# Patient Record
Sex: Male | Born: 1935 | Race: Black or African American | Hispanic: No | Marital: Married | Smoking: Former smoker
Health system: Southern US, Community
[De-identification: ages and names within clinical notes are randomized; demographics above are authoritative.]

## PROBLEM LIST (undated history)

## (undated) ENCOUNTER — Telehealth

## (undated) ENCOUNTER — Ambulatory Visit: Payer: MEDICARE

## (undated) ENCOUNTER — Telehealth: Attending: Hematology & Oncology | Primary: Hematology & Oncology

## (undated) ENCOUNTER — Ambulatory Visit

## (undated) ENCOUNTER — Encounter: Attending: Pharmacist | Primary: Pharmacist

## (undated) ENCOUNTER — Encounter

## (undated) ENCOUNTER — Encounter: Attending: Hematology & Oncology | Primary: Hematology & Oncology

## (undated) ENCOUNTER — Ambulatory Visit: Payer: MEDICARE | Attending: Oncology | Primary: Oncology

## (undated) ENCOUNTER — Encounter: Attending: Pediatrics | Primary: Pediatrics

## (undated) ENCOUNTER — Ambulatory Visit: Payer: MEDICARE | Attending: Hematology & Oncology | Primary: Hematology & Oncology

## (undated) ENCOUNTER — Telehealth: Attending: Pharmacist | Primary: Pharmacist

## (undated) DIAGNOSIS — I4510 Unspecified right bundle-branch block: Secondary | ICD-10-CM

## (undated) DIAGNOSIS — I255 Ischemic cardiomyopathy: Secondary | ICD-10-CM

## (undated) DIAGNOSIS — I5042 Chronic combined systolic (congestive) and diastolic (congestive) heart failure: Secondary | ICD-10-CM

## (undated) DIAGNOSIS — D509 Iron deficiency anemia, unspecified: Secondary | ICD-10-CM

## (undated) DIAGNOSIS — K922 Gastrointestinal hemorrhage, unspecified: Secondary | ICD-10-CM

## (undated) DIAGNOSIS — E039 Hypothyroidism, unspecified: Secondary | ICD-10-CM

## (undated) DIAGNOSIS — E119 Type 2 diabetes mellitus without complications: Secondary | ICD-10-CM

## (undated) DIAGNOSIS — C9210 Chronic myeloid leukemia, BCR/ABL-positive, not having achieved remission: Secondary | ICD-10-CM

## (undated) DIAGNOSIS — N6489 Other specified disorders of breast: Secondary | ICD-10-CM

## (undated) DIAGNOSIS — K219 Gastro-esophageal reflux disease without esophagitis: Secondary | ICD-10-CM

## (undated) DIAGNOSIS — N400 Enlarged prostate without lower urinary tract symptoms: Secondary | ICD-10-CM

## (undated) DIAGNOSIS — H5440 Blindness, one eye, unspecified eye: Secondary | ICD-10-CM

## (undated) DIAGNOSIS — N184 Chronic kidney disease, stage 4 (severe): Secondary | ICD-10-CM

## (undated) DIAGNOSIS — M1990 Unspecified osteoarthritis, unspecified site: Secondary | ICD-10-CM

## (undated) DIAGNOSIS — I480 Paroxysmal atrial fibrillation: Secondary | ICD-10-CM

## (undated) DIAGNOSIS — M109 Gout, unspecified: Secondary | ICD-10-CM

## (undated) DIAGNOSIS — C61 Malignant neoplasm of prostate: Secondary | ICD-10-CM

## (undated) DIAGNOSIS — I951 Orthostatic hypotension: Secondary | ICD-10-CM

## (undated) DIAGNOSIS — I2510 Atherosclerotic heart disease of native coronary artery without angina pectoris: Secondary | ICD-10-CM

## (undated) DIAGNOSIS — I10 Essential (primary) hypertension: Secondary | ICD-10-CM

## (undated) DIAGNOSIS — G43909 Migraine, unspecified, not intractable, without status migrainosus: Secondary | ICD-10-CM

## (undated) DIAGNOSIS — G4733 Obstructive sleep apnea (adult) (pediatric): Secondary | ICD-10-CM

## (undated) DIAGNOSIS — I428 Other cardiomyopathies: Secondary | ICD-10-CM

## (undated) DIAGNOSIS — E785 Hyperlipidemia, unspecified: Secondary | ICD-10-CM

## (undated) HISTORY — DX: Gastrointestinal hemorrhage, unspecified: K92.2

## (undated) HISTORY — DX: Ischemic cardiomyopathy: I25.5

## (undated) HISTORY — PX: CATARACT EXTRACTION W/ INTRAOCULAR LENS  IMPLANT, BILATERAL: SHX1307

## (undated) HISTORY — DX: Orthostatic hypotension: I95.1

## (undated) HISTORY — PX: TOE AMPUTATION: SHX809

## (undated) HISTORY — DX: Obstructive sleep apnea (adult) (pediatric): G47.33

## (undated) HISTORY — PX: HERNIA REPAIR: SHX51

## (undated) HISTORY — DX: Chronic kidney disease, stage 4 (severe): N18.4

## (undated) HISTORY — DX: Malignant neoplasm of prostate: C61

## (undated) HISTORY — PX: EYE SURGERY: SHX253

---
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
## 1993-06-22 DIAGNOSIS — I5023 Acute on chronic systolic (congestive) heart failure: Secondary | ICD-10-CM | POA: Insufficient documentation

## 1993-06-22 DIAGNOSIS — I5022 Chronic systolic (congestive) heart failure: Secondary | ICD-10-CM

## 2001-01-05 DIAGNOSIS — G250 Essential tremor: Secondary | ICD-10-CM

## 2001-01-05 DIAGNOSIS — G252 Other specified forms of tremor: Secondary | ICD-10-CM

## 2003-09-20 DIAGNOSIS — E785 Hyperlipidemia, unspecified: Secondary | ICD-10-CM

## 2006-03-02 ENCOUNTER — Emergency Department: Payer: Self-pay | Admitting: Emergency Medicine

## 2010-06-14 ENCOUNTER — Emergency Department: Payer: Self-pay | Admitting: Emergency Medicine

## 2010-06-15 ENCOUNTER — Ambulatory Visit: Payer: Self-pay | Admitting: Surgery

## 2010-10-11 DIAGNOSIS — Z1211 Encounter for screening for malignant neoplasm of colon: Secondary | ICD-10-CM | POA: Insufficient documentation

## 2010-10-11 DIAGNOSIS — E039 Hypothyroidism, unspecified: Secondary | ICD-10-CM | POA: Insufficient documentation

## 2010-12-10 DIAGNOSIS — N400 Enlarged prostate without lower urinary tract symptoms: Secondary | ICD-10-CM | POA: Insufficient documentation

## 2011-04-01 DIAGNOSIS — D509 Iron deficiency anemia, unspecified: Secondary | ICD-10-CM | POA: Insufficient documentation

## 2011-04-11 DIAGNOSIS — Z9849 Cataract extraction status, unspecified eye: Secondary | ICD-10-CM | POA: Insufficient documentation

## 2011-06-14 DIAGNOSIS — H4310 Vitreous hemorrhage, unspecified eye: Secondary | ICD-10-CM | POA: Insufficient documentation

## 2011-09-25 DIAGNOSIS — M109 Gout, unspecified: Secondary | ICD-10-CM | POA: Insufficient documentation

## 2011-09-25 DIAGNOSIS — K219 Gastro-esophageal reflux disease without esophagitis: Secondary | ICD-10-CM | POA: Insufficient documentation

## 2011-09-25 DIAGNOSIS — E785 Hyperlipidemia, unspecified: Secondary | ICD-10-CM | POA: Insufficient documentation

## 2011-09-30 DIAGNOSIS — Z961 Presence of intraocular lens: Secondary | ICD-10-CM | POA: Insufficient documentation

## 2011-09-30 DIAGNOSIS — H179 Unspecified corneal scar and opacity: Secondary | ICD-10-CM | POA: Insufficient documentation

## 2011-11-25 DIAGNOSIS — H269 Unspecified cataract: Secondary | ICD-10-CM | POA: Insufficient documentation

## 2012-02-06 ENCOUNTER — Inpatient Hospital Stay: Payer: Self-pay | Admitting: Surgery

## 2012-02-06 LAB — PROTIME-INR
INR: 2.4
Prothrombin Time: 26.7 secs — ABNORMAL HIGH (ref 11.5–14.7)

## 2012-02-06 LAB — HEPATIC FUNCTION PANEL A (ARMC)
Albumin: 3.4 g/dL (ref 3.4–5.0)
Bilirubin, Direct: 0.1 mg/dL (ref 0.00–0.20)
SGOT(AST): 25 U/L (ref 15–37)
SGPT (ALT): 20 U/L (ref 12–78)
Total Protein: 8 g/dL (ref 6.4–8.2)

## 2012-02-06 LAB — DIFFERENTIAL
Bands: 3 %
Lymphocytes: 3 %
Metamyelocyte: 3 %
Myelocyte: 4 %

## 2012-02-06 LAB — URINALYSIS, COMPLETE
Bacteria: NONE SEEN
Bilirubin,UR: NEGATIVE
Blood: NEGATIVE
Glucose,UR: 150 mg/dL (ref 0–75)
Ketone: NEGATIVE
Leukocyte Esterase: NEGATIVE
Nitrite: NEGATIVE
Ph: 6 (ref 4.5–8.0)
RBC,UR: NONE SEEN /HPF (ref 0–5)
Squamous Epithelial: NONE SEEN

## 2012-02-06 LAB — CBC
HGB: 12 g/dL — ABNORMAL LOW (ref 13.0–18.0)
MCV: 93 fL (ref 80–100)
RBC: 3.96 10*6/uL — ABNORMAL LOW (ref 4.40–5.90)

## 2012-02-06 LAB — BASIC METABOLIC PANEL
Anion Gap: 6 — ABNORMAL LOW (ref 7–16)
BUN: 16 mg/dL (ref 7–18)
Chloride: 103 mmol/L (ref 98–107)
Co2: 26 mmol/L (ref 21–32)
Creatinine: 1.1 mg/dL (ref 0.60–1.30)
EGFR (African American): >60
Sodium: 135 mmol/L — ABNORMAL LOW (ref 136–145)

## 2012-02-06 LAB — APTT: Activated PTT: 51.8 secs — ABNORMAL HIGH (ref 23.6–35.9)

## 2012-02-07 LAB — CBC WITH DIFFERENTIAL/PLATELET
Bands: 4 %
Eosinophil: 2 %
HGB: 10.8 g/dL — ABNORMAL LOW (ref 13.0–18.0)
Lymphocytes: 11 %
MCHC: 31.8 g/dL — ABNORMAL LOW (ref 32.0–36.0)
Monocytes: 6 %
Myelocyte: 2 %
Platelet: 308 10*3/uL (ref 150–440)
RDW: 15.6 % — ABNORMAL HIGH (ref 11.5–14.5)
Segmented Neutrophils: 70 %
WBC: 40.6 10*3/uL — ABNORMAL HIGH (ref 3.8–10.6)

## 2012-02-07 LAB — PROTIME-INR
INR: 2.6
Prothrombin Time: 28.1 secs — ABNORMAL HIGH (ref 11.5–14.7)

## 2012-02-07 LAB — WBCS, STOOL

## 2012-02-08 LAB — COMPREHENSIVE METABOLIC PANEL
Albumin: 3.1 g/dL — ABNORMAL LOW (ref 3.4–5.0)
Alkaline Phosphatase: 82 U/L (ref 50–136)
Anion Gap: 8 (ref 7–16)
BUN: 9 mg/dL (ref 7–18)
Bilirubin,Total: 0.8 mg/dL (ref 0.2–1.0)
Calcium, Total: 9 mg/dL (ref 8.5–10.1)
Chloride: 104 mmol/L (ref 98–107)
Co2: 25 mmol/L (ref 21–32)
Creatinine: 1 mg/dL (ref 0.60–1.30)
EGFR (African American): >60
EGFR (Non-African Amer.): >60
Osmolality: 275 (ref 275–301)
SGPT (ALT): 20 U/L (ref 12–78)
Sodium: 137 mmol/L (ref 136–145)
Total Protein: 7.2 g/dL (ref 6.4–8.2)

## 2012-02-08 LAB — CBC WITH DIFFERENTIAL/PLATELET
Bands: 3 %
HGB: 11.5 g/dL — ABNORMAL LOW (ref 13.0–18.0)
Lymphocytes: 12 %
MCH: 29.2 pg (ref 26.0–34.0)
MCV: 94 fL (ref 80–100)
Metamyelocyte: 1 %
Monocytes: 4 %
Platelet: 351 10*3/uL (ref 150–440)
RBC: 3.92 10*6/uL — ABNORMAL LOW (ref 4.40–5.90)
Segmented Neutrophils: 74 %
Variant Lymphocyte - H1-Rlymph: 5 %
WBC: 34.5 10*3/uL — ABNORMAL HIGH (ref 3.8–10.6)

## 2012-02-08 LAB — APTT: Activated PTT: 44.5 secs — ABNORMAL HIGH (ref 23.6–35.9)

## 2012-02-08 LAB — PROTIME-INR: INR: 2.1

## 2012-02-09 LAB — CBC WITH DIFFERENTIAL/PLATELET
Bands: 2 %
Basophil: 1 %
HGB: 11.1 g/dL — ABNORMAL LOW (ref 13.0–18.0)
Lymphocytes: 11 %
MCHC: 32.3 g/dL (ref 32.0–36.0)
Metamyelocyte: 5 %
Monocytes: 8 %
Myelocyte: 2 %
RDW: 15 % — ABNORMAL HIGH (ref 11.5–14.5)
Segmented Neutrophils: 63 %
WBC: 29.6 10*3/uL — ABNORMAL HIGH (ref 3.8–10.6)

## 2012-02-09 LAB — VANCOMYCIN, TROUGH: Vancomycin, Trough: 9 ug/mL — ABNORMAL LOW (ref 10–20)

## 2012-02-09 LAB — PROTIME-INR
INR: 1.9
Prothrombin Time: 22 secs — ABNORMAL HIGH (ref 11.5–14.7)

## 2012-02-10 LAB — CBC WITH DIFFERENTIAL/PLATELET
Bands: 2 %
Basophil: 3 %
HGB: 12.2 g/dL — ABNORMAL LOW (ref 13.0–18.0)
Lymphocytes: 8 %
MCV: 93 fL (ref 80–100)
Myelocyte: 2 %
RBC: 3.86 10*6/uL — ABNORMAL LOW (ref 4.40–5.90)
RDW: 15 % — ABNORMAL HIGH (ref 11.5–14.5)
Segmented Neutrophils: 76 %
WBC: 37 10*3/uL — ABNORMAL HIGH (ref 3.8–10.6)

## 2012-02-10 LAB — BASIC METABOLIC PANEL
Calcium, Total: 9.1 mg/dL (ref 8.5–10.1)
Creatinine: 1.03 mg/dL (ref 0.60–1.30)
EGFR (African American): >60
EGFR (Non-African Amer.): >60
Glucose: 153 mg/dL — ABNORMAL HIGH (ref 65–99)
Sodium: 135 mmol/L — ABNORMAL LOW (ref 136–145)

## 2012-02-10 LAB — STOOL CULTURE

## 2012-02-11 LAB — BASIC METABOLIC PANEL
BUN: 14 mg/dL (ref 7–18)
Calcium, Total: 9.2 mg/dL (ref 8.5–10.1)
Chloride: 104 mmol/L (ref 98–107)
Co2: 24 mmol/L (ref 21–32)
Creatinine: 1.13 mg/dL (ref 0.60–1.30)
EGFR (African American): >60
EGFR (Non-African Amer.): >60
Glucose: 141 mg/dL — ABNORMAL HIGH (ref 65–99)
Potassium: 4 mmol/L (ref 3.5–5.1)
Sodium: 135 mmol/L — ABNORMAL LOW (ref 136–145)

## 2012-02-11 LAB — CBC WITH DIFFERENTIAL/PLATELET
Eosinophil: 2 %
Lymphocytes: 5 %
MCH: 30.4 pg (ref 26.0–34.0)
MCV: 93 fL (ref 80–100)
Metamyelocyte: 6 %
Monocytes: 7 %
Myelocyte: 1 %
Platelet: 415 10*3/uL (ref 150–440)
RBC: 4 10*6/uL — ABNORMAL LOW (ref 4.40–5.90)
Segmented Neutrophils: 71 %
WBC: 38.6 10*3/uL — ABNORMAL HIGH (ref 3.8–10.6)

## 2012-02-11 LAB — VANCOMYCIN, TROUGH: Vancomycin, Trough: 16 ug/mL (ref 10–20)

## 2012-02-11 LAB — STOOL CULTURE

## 2012-02-11 LAB — PROTIME-INR: INR: 1.2

## 2012-02-12 LAB — URINALYSIS, COMPLETE
Bacteria: NONE SEEN
Blood: NEGATIVE
Glucose,UR: 50 mg/dL (ref 0–75)
Ketone: NEGATIVE
Leukocyte Esterase: NEGATIVE
Nitrite: NEGATIVE
Ph: 7 (ref 4.5–8.0)
Protein: NEGATIVE
RBC,UR: <1 /HPF (ref 0–5)
Specific Gravity: 1.004 (ref 1.003–1.030)
Squamous Epithelial: NONE SEEN
WBC UR: NONE SEEN /HPF (ref 0–5)

## 2012-02-12 LAB — CBC WITH DIFFERENTIAL/PLATELET
Bands: 6 %
Basophil: 1 %
Eosinophil: 1 %
HGB: 11.3 g/dL — ABNORMAL LOW (ref 13.0–18.0)
MCH: 30.3 pg (ref 26.0–34.0)
MCHC: 32.8 g/dL (ref 32.0–36.0)
MCV: 93 fL (ref 80–100)
Metamyelocyte: 3 %
Monocytes: 6 %
Myelocyte: 4 %
Platelet: 381 10*3/uL (ref 150–440)
Segmented Neutrophils: 70 %
Variant Lymphocyte - H1-Rlymph: 1 %

## 2012-02-12 LAB — BASIC METABOLIC PANEL
Anion Gap: 7 (ref 7–16)
BUN: 14 mg/dL (ref 7–18)
Calcium, Total: 8.9 mg/dL (ref 8.5–10.1)
Creatinine: 1.1 mg/dL (ref 0.60–1.30)
EGFR (Non-African Amer.): >60
Glucose: 170 mg/dL — ABNORMAL HIGH (ref 65–99)
Osmolality: 276 (ref 275–301)
Potassium: 3.7 mmol/L (ref 3.5–5.1)

## 2012-02-12 LAB — PROTIME-INR: INR: 1.2

## 2012-02-13 LAB — CBC WITH DIFFERENTIAL/PLATELET
Basophil #: 0.7 10*3/uL — ABNORMAL HIGH (ref 0.0–0.1)
Basophil %: 1.9 %
Basophil: 1 %
Eosinophil #: 0.7 10*3/uL (ref 0.0–0.7)
Eosinophil: 1 %
HCT: 32.5 % — ABNORMAL LOW (ref 40.0–52.0)
HGB: 10.5 g/dL — ABNORMAL LOW (ref 13.0–18.0)
Lymphocyte #: 2.2 10*3/uL (ref 1.0–3.6)
Lymphocyte %: 6.4 %
Lymphocytes: 8 %
MCHC: 32.4 g/dL (ref 32.0–36.0)
MCV: 93 fL (ref 80–100)
Metamyelocyte: 7 %
Monocyte #: 2.4 x10 3/mm — ABNORMAL HIGH (ref 0.2–1.0)
Myelocyte: 5 %
Neutrophil #: 28.5 10*3/uL — ABNORMAL HIGH (ref 1.4–6.5)
Neutrophil %: 82.8 %
RBC: 3.5 10*6/uL — ABNORMAL LOW (ref 4.40–5.90)
Segmented Neutrophils: 71 %
WBC: 34.4 10*3/uL — ABNORMAL HIGH (ref 3.8–10.6)

## 2012-02-15 LAB — WOUND CULTURE

## 2012-02-19 ENCOUNTER — Other Ambulatory Visit: Payer: Self-pay | Admitting: Surgery

## 2012-02-19 LAB — CBC WITH DIFFERENTIAL/PLATELET
Basophil #: 0.2 10*3/uL — ABNORMAL HIGH (ref 0.0–0.1)
Basophil %: 1.1 %
Eosinophil %: 1.2 %
HCT: 36.9 % — ABNORMAL LOW (ref 40.0–52.0)
HGB: 11.7 g/dL — ABNORMAL LOW (ref 13.0–18.0)
Lymphocyte %: 13.9 %
MCH: 29.5 pg (ref 26.0–34.0)
MCV: 93 fL (ref 80–100)
Neutrophil #: 15.5 10*3/uL — ABNORMAL HIGH (ref 1.4–6.5)
Neutrophil %: 74.9 %
RBC: 3.98 10*6/uL — ABNORMAL LOW (ref 4.40–5.90)
RDW: 15.6 % — ABNORMAL HIGH (ref 11.5–14.5)

## 2012-02-27 ENCOUNTER — Other Ambulatory Visit: Payer: Self-pay | Admitting: Surgery

## 2012-02-27 LAB — CBC WITH DIFFERENTIAL/PLATELET
Basophil: 2 %
Eosinophil: 1 %
HCT: 35.9 % — ABNORMAL LOW (ref 40.0–52.0)
MCH: 29.6 pg (ref 26.0–34.0)
MCHC: 31.7 g/dL — ABNORMAL LOW (ref 32.0–36.0)
MCV: 93 fL (ref 80–100)
Myelocyte: 1 %
Platelet: 352 10*3/uL (ref 150–440)
RBC: 3.85 10*6/uL — ABNORMAL LOW (ref 4.40–5.90)
RDW: 15.4 % — ABNORMAL HIGH (ref 11.5–14.5)

## 2012-03-13 ENCOUNTER — Ambulatory Visit: Payer: Self-pay | Admitting: Oncology

## 2012-03-14 ENCOUNTER — Emergency Department: Payer: Self-pay | Admitting: Unknown Physician Specialty

## 2012-03-14 LAB — PROTIME-INR: INR: 2.2

## 2012-03-14 LAB — HEMATOCRIT: HCT: 36.9 % — ABNORMAL LOW (ref 40.0–52.0)

## 2012-03-14 LAB — HEMOGLOBIN: HGB: 12 g/dL — ABNORMAL LOW (ref 13.0–18.0)

## 2012-03-16 LAB — CBC CANCER CENTER
Basophil #: 0 x10 3/mm (ref 0.0–0.1)
Basophil %: 0.2 %
Eosinophil #: 0.1 x10 3/mm (ref 0.0–0.7)
HGB: 12.5 g/dL — ABNORMAL LOW (ref 13.0–18.0)
Lymphocyte %: 16.1 %
MCHC: 33.1 g/dL (ref 32.0–36.0)
Monocyte %: 10.4 %
Neutrophil %: 72.4 %
Platelet: 345 x10 3/mm (ref 150–440)
RBC: 4.1 10*6/uL — ABNORMAL LOW (ref 4.40–5.90)
WBC: 15.1 x10 3/mm — ABNORMAL HIGH (ref 3.8–10.6)

## 2012-03-18 ENCOUNTER — Ambulatory Visit: Payer: Self-pay | Admitting: Oncology

## 2012-05-03 ENCOUNTER — Emergency Department: Payer: Self-pay | Admitting: Internal Medicine

## 2012-05-03 LAB — COMPREHENSIVE METABOLIC PANEL
Alkaline Phosphatase: 99 U/L (ref 50–136)
Anion Gap: 10 (ref 7–16)
BUN: 23 mg/dL — ABNORMAL HIGH (ref 7–18)
Bilirubin,Total: 0.6 mg/dL (ref 0.2–1.0)
Calcium, Total: 8.4 mg/dL — ABNORMAL LOW (ref 8.5–10.1)
Chloride: 103 mmol/L (ref 98–107)
Creatinine: 1.11 mg/dL (ref 0.60–1.30)
EGFR (Non-African Amer.): >60
Glucose: 313 mg/dL — ABNORMAL HIGH (ref 65–99)
Potassium: 4.5 mmol/L (ref 3.5–5.1)
Sodium: 135 mmol/L — ABNORMAL LOW (ref 136–145)

## 2012-05-03 LAB — CBC
HCT: 41.2 % (ref 40.0–52.0)
HGB: 13.7 g/dL (ref 13.0–18.0)
MCH: 30.6 pg (ref 26.0–34.0)
MCHC: 33.3 g/dL (ref 32.0–36.0)
MCV: 92 fL (ref 80–100)
Platelet: 340 10*3/uL (ref 150–440)
RBC: 4.48 10*6/uL (ref 4.40–5.90)
RDW: 15.6 % — ABNORMAL HIGH (ref 11.5–14.5)
WBC: 22.4 10*3/uL — ABNORMAL HIGH (ref 3.8–10.6)

## 2012-05-03 LAB — CK TOTAL AND CKMB (NOT AT ARMC)
CK, Total: 101 U/L (ref 35–232)
CK-MB: <0.5 ng/mL — ABNORMAL LOW (ref 0.5–3.6)

## 2012-05-03 LAB — PROTIME-INR: Prothrombin Time: 24.2 secs — ABNORMAL HIGH (ref 11.5–14.7)

## 2012-05-03 LAB — TROPONIN I: Troponin-I: <0.02 ng/mL

## 2012-05-09 LAB — CULTURE, BLOOD (SINGLE)

## 2012-05-17 ENCOUNTER — Observation Stay: Payer: Self-pay | Admitting: Internal Medicine

## 2012-05-17 LAB — URINALYSIS, COMPLETE
Bacteria: NONE SEEN
Hyaline Cast: 5
Nitrite: NEGATIVE
Ph: 5 (ref 4.5–8.0)
RBC,UR: <1 /HPF (ref 0–5)
Specific Gravity: 1.01 (ref 1.003–1.030)
Squamous Epithelial: 1
WBC UR: <1 /HPF (ref 0–5)

## 2012-05-17 LAB — COMPREHENSIVE METABOLIC PANEL
Albumin: 4 g/dL (ref 3.4–5.0)
Anion Gap: 7 (ref 7–16)
Calcium, Total: 9.3 mg/dL (ref 8.5–10.1)
Chloride: 91 mmol/L — ABNORMAL LOW (ref 98–107)
EGFR (African American): 41 — ABNORMAL LOW
EGFR (Non-African Amer.): 35 — ABNORMAL LOW
Glucose: 459 mg/dL — ABNORMAL HIGH (ref 65–99)
Osmolality: 279 (ref 275–301)
Sodium: 123 mmol/L — ABNORMAL LOW (ref 136–145)
Total Protein: 8.5 g/dL — ABNORMAL HIGH (ref 6.4–8.2)

## 2012-05-17 LAB — PROTIME-INR
INR: 2.5
Prothrombin Time: 26.2 secs — ABNORMAL HIGH (ref 11.5–14.7)

## 2012-05-17 LAB — DIGOXIN LEVEL: Digoxin: 1.1 ng/mL

## 2012-05-17 LAB — CBC
HCT: 41.7 % (ref 40.0–52.0)
HGB: 13.5 g/dL (ref 13.0–18.0)
MCH: 29.9 pg (ref 26.0–34.0)
MCHC: 32.4 g/dL (ref 32.0–36.0)
MCV: 92 fL (ref 80–100)
Platelet: 425 10*3/uL (ref 150–440)
RBC: 4.52 10*6/uL (ref 4.40–5.90)
RDW: 14.9 % — ABNORMAL HIGH (ref 11.5–14.5)
WBC: 27.5 10*3/uL — ABNORMAL HIGH (ref 3.8–10.6)

## 2012-05-17 LAB — DIFFERENTIAL
Monocytes: 6 %
Segmented Neutrophils: 84 %

## 2012-05-17 LAB — PRO B NATRIURETIC PEPTIDE: B-Type Natriuretic Peptide: 55 pg/mL (ref 0–450)

## 2012-05-18 LAB — BASIC METABOLIC PANEL WITH GFR
Anion Gap: 6 — ABNORMAL LOW
BUN: 38 mg/dL — ABNORMAL HIGH
Calcium, Total: 8.4 mg/dL — ABNORMAL LOW
Chloride: 103 mmol/L
Co2: 25 mmol/L
Creatinine: 1.26 mg/dL
EGFR (African American): >60
EGFR (Non-African Amer.): 55 — ABNORMAL LOW
Glucose: 220 mg/dL — ABNORMAL HIGH
Osmolality: 284
Potassium: 4.2 mmol/L
Sodium: 134 mmol/L — ABNORMAL LOW

## 2012-05-18 LAB — CBC WITH DIFFERENTIAL/PLATELET
Basophil: 4 %
HCT: 36.1 % — ABNORMAL LOW
HGB: 11.7 g/dL — ABNORMAL LOW
Lymphocytes: 10 %
MCH: 29.7 pg
MCHC: 32.4 g/dL
MCV: 92 fL
Metamyelocyte: 1 %
Monocytes: 6 %
Myelocyte: 2 %
Platelet: 358 10*3/uL
RBC: 3.94 x10 6/mm 3 — ABNORMAL LOW
RDW: 15.3 % — ABNORMAL HIGH
Segmented Neutrophils: 76 %
Variant Lymphocyte - H1-Rlymph: 1 %
WBC: 23.5 10*3/uL — ABNORMAL HIGH

## 2012-09-15 HISTORY — PX: CORONARY ANGIOPLASTY WITH STENT PLACEMENT: SHX49

## 2013-04-09 DIAGNOSIS — E1121 Type 2 diabetes mellitus with diabetic nephropathy: Secondary | ICD-10-CM | POA: Insufficient documentation

## 2013-04-09 DIAGNOSIS — Z794 Long term (current) use of insulin: Secondary | ICD-10-CM | POA: Insufficient documentation

## 2013-04-09 DIAGNOSIS — E1140 Type 2 diabetes mellitus with diabetic neuropathy, unspecified: Secondary | ICD-10-CM | POA: Insufficient documentation

## 2013-04-09 DIAGNOSIS — E1165 Type 2 diabetes mellitus with hyperglycemia: Secondary | ICD-10-CM

## 2013-04-10 DIAGNOSIS — M7750 Other enthesopathy of unspecified foot: Secondary | ICD-10-CM | POA: Insufficient documentation

## 2013-10-22 DIAGNOSIS — H2512 Age-related nuclear cataract, left eye: Secondary | ICD-10-CM | POA: Insufficient documentation

## 2014-03-31 DIAGNOSIS — N62 Hypertrophy of breast: Secondary | ICD-10-CM | POA: Insufficient documentation

## 2014-05-16 ENCOUNTER — Emergency Department: Payer: Self-pay | Admitting: Emergency Medicine

## 2014-05-20 ENCOUNTER — Emergency Department: Payer: Self-pay | Admitting: Emergency Medicine

## 2014-07-05 NOTE — Consult Note (Signed)
PATIENT NAME:  Lucas Caldwell, Lucas Caldwell MR#:  [id number] DATE OF BIRTH:  Mar 25, 1933  DATE OF CONSULTATION:  02/07/2012  REFERRING PHYSICIAN:  Dr. Rexene Edison of surgery  CONSULTING PHYSICIAN:  Devi Hopman R. Cartrell Bentsen, MD  REASON FOR CONSULTATION: Atrial fibrillation on Coumadin.   HISTORY OF PRESENTING ILLNESS: 81 year old African American male patient with history of atrial fibrillation on Coumadin, hypertension, type 2 diabetes mellitus presented to the Emergency Room complaining of two to three days of pain and swelling in his scrotal area. Patient initially saw his primary care physician, was given clindamycin along with advised on warm bath and sent home. This did not respond well. He called his primary care physician who advised him to present in the ER. Patient has been admitted to the surgical service, is on vancomycin for the same, seems to be improving and possible surgery is being considered. Patient's INR is 2.4. His Coumadin has been held at this time. [name] on Lovenox for deep vein thrombosis prophylaxis.   He does not complain of any shortness of breath, abdominal pain or rash. His scrotal pain and swelling is slowly improving.   PAST MEDICAL HISTORY:  1. Atrial fibrillation on Coumadin. 2. Hypertension. 3. Gout. 4. Type 2 diabetes mellitus. 5. Dyslipidemia. 6. Gastroesophageal reflux disease. 7. Status post umbilicus hernia repair.   MEDICATIONS:  1. Allopurinol 300 mg daily.  2. Aspirin 81 mg daily.  3. Brilinta 90 mg b.i.d.  4. Clindamycin 300 mg 3 times a day.  5. Digoxin 0.125 mg daily.  6. Enalapril 5 mg daily.  7. Ferrous gluconate 324 mg daily.  8. Lasix 40 mg q.a.m. and 20 mg q.p.m.  9. Glipizide 10 mg daily.  10. Levoxyl 0.05 mg daily.  11. Metanx 1 capsule daily.  12. Metoprolol 50 mg daily.  13. Omeprazole 20 mg daily.  14. Simvastatin 20 mg at bedtime.  15. Coumadin 5 mg at bedtime.   ALLERGIES: Plavix which causes confusion.   REVIEW OF SYSTEMS: Please see history  of presenting illness. Rest of the systems reviewed and negative, 12 systems reviewed.   FAMILY HISTORY: Reviewed and unknown.   SOCIAL HISTORY: Patient is married, lives with his wife. 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   PHYSICAL EXAMINATION:  VITAL SIGNS: Temperature 98.3, pulse 85, blood pressure 133/74, saturating 94% on room.   GENERAL: Obese African American male patient lying in bed, comfortable, conversational, cooperative with exam.   PSYCHIATRIC: Alert, oriented x3. Mood and affect appropriate.  HEENT: Atraumatic, normocephalic. Oral mucosa moist and pink. External ears and nose normal. No pallor. No icterus. Pupils bilaterally equal and reactive to light.   NECK: Supple. No thyromegaly. No palpable lymph nodes. Trachea midline. No carotid bruit, JVD.   CARDIOVASCULAR: S1, S2, regular rate and rhythm without any murmurs.   RESPIRATORY: Normal work of breathing. Clear to auscultation both sides.   GASTROINTESTINAL: Soft abdomen, nontender. Bowel sounds present. No hepatosplenomegaly palpable.   SKIN: Warm and dry. No petechiae, rash, ulcers. Has mild erythema in his scrotal area.   GENITAL: Genital examination shows no swelling. Has some redness.   NEUROLOGICAL: Motor strength 5/5 in upper and lower extremities. Sensation to fine touch intact all over.   LYMPHATIC: No cervical or inguinal lymphadenopathy.   LABORATORY, DIAGNOSTIC AND RADIOLOGIC DATA: Glucose 273, BUN 16, creatinine 1.10, sodium 135, potassium 4.4, WBC 40.6, hemoglobin 10.8, platelets 308. INR 2.6. Lactic acid was 1.8 at the time of admission.   ASSESSMENT AND PLAN:  1. Perineal  erythema, swelling. Possible cellulitis. Patient is being watched and being treated with vancomycin at this time. May need surgery. Patient would be a low risk for this surgery. [name] on Coumadin with elevated INR which increases his change of bleeding. Coumadin has been held. His INR needs to be reversed back to normal  or wait for it to come back to normal prior to surgery at this time. Await cultures.  2. Atrial fibrillation, on Coumadin. INR is therapeutic at this time. Continue the rate control medications. No acute needs.  3. Diabetes mellitus, uncontrolled likely secondary from the infection. Sliding scale insulin and diabetic diet.  4. Hypertension, well controlled. Continue medications.  5. Deep vein prophylaxis. Patient is on Coumadin, elevated INR.  6. CODE STATUS: FULL CODE.   TIME SPENT: Time spent on this case was 45 minutes.   ____________________________ Leia Alf Courtland Reas, MD srs:cms D: 02/07/2012 13:59:30 ET T: 02/07/2012 14:12:27 ET JOB#: [id number]  cc: Alveta Heimlich R. Renika Shiflet, MD, <Dictator> Algis Liming. Jimmye Norman, MD Neita Carp MD ELECTRONICALLY SIGNED 02/08/2012 16:17

## 2014-07-05 NOTE — Discharge Summary (Signed)
PATIENT NAME:  Lucas Caldwell, Lucas Caldwell MR#:  [id number] DATE OF BIRTH:  18-Mar-1934  DATE OF ADMISSION:  02/06/2012 DATE OF DISCHARGE:  02/13/2012  FINAL DIAGNOSES:  1. Perineal abscess status post drainage.  2. Leukocytosis, infectious versus neoplastic etiology.  3. Obesity.  4. Type II diabetes.  5. History of atrial fibrillation.  6. Hypertension.  7. Gout.  8. Gastroesophageal reflux disease.  9. Dyslipidemia.   HOSPITAL COURSE SUMMARY: The patient was admitted with a large perineal abscess. No obvious area of fluctuation. White count was markedly elevated. Medical consultation was obtained. His Coumadin and Brilinta was held. His INR slowly drifted down. White count remained elevated. Ultrasound of the perineum was obtained on the 24th which demonstrated an inflammatory phlegmon. He did continue to have significant pain in his scrotal and perianal region. For this reason, the patient was brought to the operating room on November 26th at which point a perianal perineal body abscess was drained. Despite this, his white count did not improve. He was treated during the hospitalization with vancomycin and intravenous vancomycin. Furthermore, a CT scan obtained on the day of his admission demonstrated stranding seen within the subcutaneous fat of the perineum. No discrete fluid collections were noticed. Following the incision and drainage, the patient had immediate pain relief. He was seen on November 27th, postop day #1, and was seen to be doing well. White count remained markedly elevated. On postoperative day #2, the patient was having no complaints and was examined at the bedside. Packing was removed. No evidence of undrained purulence was noted. Thinking at this point was that possibly this might represent chronic myelogenous leukemia. There was an increase in neutrophils on the differential. For this reason the patient was, therefore, discharged home with oral antibiotics, pain medication, and  follow-up with me in the office next week with repeat white count at that time. Will refer to Oncology and Hematology if needed based on those findings.    DISCHARGE MEDICATIONS:  1. Norco 1 to 2 tabs every 4 to 6 hours as needed for pain. 2. Bactrim Ds 1 tab po bid for 7 days. 3. Metoprolol 50 mg extended-release once a day.  4. Ferrous sulfate.  5. Brilinta 90 mg by mouth b.i.d.  6. Allopurinol 30 mg by mouth once a day. 7. Aspirin 81 mg by mouth once a day. 8. Digoxin 0.125 mg by mouth once a day. 9. Simvastatin 20 mg by mouth once a day. 10. Synthroid 50 mcg by mouth once a day. 11. Omeprazole 20 mg extended-release once a day. 12. Lasix 40 mg by mouth once a day. 13. Lasix 20 mg by mouth once in the afternoon after lunch. 14. Enalapril 5 mg by mouth once a day. 15. Glipizide 10 mg by mouth once a day. 16. Warfarin 5 mg by mouth once a day.  17. Metanx oral capsule one cap by mouth once a day.   Call with any questions or concerns. Follow-up is on 02/19/2012 in the [place] office.   ____________________________ Jeannette How. Marina Gravel, MD mab:drc D: 02/13/2012 09:51:10 ET T: 02/13/2012 15:36:36 ET JOB#: [id number]  cc: Elta Guadeloupe A. Marina Gravel, MD, <Dictator> Hortencia Conradi MD ELECTRONICALLY SIGNED 02/15/2012 17:34

## 2014-07-05 NOTE — Op Note (Signed)
PATIENT NAME:  Lucas Caldwell, Lucas Caldwell MR#:  [id number] DATE OF BIRTH:  May 21, 1933  DATE OF PROCEDURE:  02/11/2012  PREOPERATIVE DIAGNOSIS: Perianal abscess.   POSTOPERATIVE DIAGNOSIS: Perianal abscess.   PROCEDURE PERFORMED: Examination under anesthesia, incision and drainage of left-sided perianal abscess.   SURGEON: Diannah Rindfleisch A. Marina Gravel, M.D.   SPECIMENS: Pus.   FINDINGS: As described above.   DESCRIPTION OF PROCEDURE: With the patient in the supine position, general anesthesia was induced. He was then positioned and padded in dorsal lithotomy. The perineum was sterilely prepped and draped with Betadine solution. Time out was observed. Digital rectal examination demonstrated the abscess to be much higher up on the perineal body, on the left side. There did not appear to be a direct communication to the anus. The bivalved anoscope was introduced. I could not identify a fistulous connection.   A cruciate incision was fashioned at the most fluctuant area of the mass, on the perineum, to the left of the median raphe. A large amount of pus was extruded. There was a good-sized cavity measuring at least 4 to 5 cm in total anterior to posterior direction. This was then irrigated multiple times with saline solution. With hemostasis appearing adequate on the operative field.  The wound was then packed with a long piece of half-inch iodoform gauze. 4 x 4's and mesh surgical underwear were then applied. The patient was then returned to the recovery room in stable and satisfactory condition. ____________________________ Jeannette How Marina Gravel, MD mab:slb D: 02/12/2012 09:48:00 ET T: 02/12/2012 10:01:52 ET JOB#: [id number]  cc: Elta Guadeloupe A. Marina Gravel, MD, <Dictator> Hortencia Conradi MD ELECTRONICALLY SIGNED 02/15/2012 17:31

## 2014-07-05 NOTE — H&P (Signed)
PATIENT NAME:  Lucas Caldwell, Lucas Caldwell MR#:  [id number] DATE OF BIRTH:  Jul 25, 1933  DATE OF ADMISSION:  02/06/2012  HISTORY OF PRESENT ILLNESS: Mr. Conto is a 81 year old black male who has a 2-1/2 day history of pain in his scrotal raphe region. He sought medical attention 2-1/2 days ago and was told to take warm baths and he was placed on clindamycin 300 mg t.i.d. He had difficulty with the first bath and, therefore, he has been using hot water bottles on his scrotal raphe for the last 2-1/2 days. He says that his pain is somewhat improved since Tuesday. He denies fever, chills, nausea, vomiting, diarrhea, hematochezia. He does have fairly severe constipation and he says his last good bowel movement was last week. He has passed very small amounts of hard stool over the last 5 or 6 days only. 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 He did not have any drainage and says that it is somewhat difficult (because of pain) to have a bowel movement.   PAST MEDICAL HISTORY:  1. Atrial fibrillation.  2. Hypertension.  3. Gout.  4. Type II diabetes mellitus.  5. Dyslipidemia.  6. Gastroesophageal reflux disease.  7. Status post umbilical hernia repair.  8. Severe right cataract with unilateral blindness.   MEDICATIONS:  1. Allopurinol 300 mg daily.  2. Aspirin 81 mg daily.  3. Brilinta 90 mg b.i.d.  4. Clindamycin 300 mg t.i.d.  5. Digoxin 0.125 mg daily.  6. Enalapril 5 mg daily.  7. Ferrous gluconate 324 mg daily.  8. Lasix 40 mg q.a.m. and 20 mg q.p.m.  9. Glipizide 10 mg daily.  10. Levoxyl 0.05 mg daily.  11. Metanx 1 capsule daily.  12. Metoprolol 50 mg daily.  13. Omeprazole 20 mg daily.  14. Simvastatin 20 mg at bedtime.  15. Warfarin 5 mg q.p.m.   ALLERGIES: Plavix (altered mental status).    REVIEW OF SYSTEMS: Negative for 10 systems except the constipation and groin pain as mentioned in the history of present  illness. Specifically, the patient denies fever, chills, and drainage.   FAMILY HISTORY: Noncontributory.   SOCIAL HISTORY: The patient is married and lives with his wife and his 79 year old son lives with them. 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 He quit smoking cigarettes 25 years ago and does not drink alcohol.   PHYSICAL EXAMINATION:   GENERAL: Pleasant, healthy elderly black male lying comfortably on the Emergency Department stretcher.   VITAL SIGNS: Height 5 feet 6 inches, weight 189 pounds, BMI 30.5. Temperature 98.3, pulse 85, respirations 20, blood pressure 146/77, oxygen saturation 97% on room air at rest.   HEENT: The right eye has a severe cataract and the left pupil is round and reactive to light. Extraocular movements intact. Sclerae nonicteric. Oropharynx clear. Mucous membranes moist. Hearing intact to voice.   NECK: Supple with no tracheal deviation or jugular venous distention.   HEART: Regular rate and rhythm with no murmurs or rubs.   LUNGS: Clear to auscultation with normal respiratory effort bilaterally.   ABDOMEN: Soft, nontender, nondistended with no palpable hepatosplenomegaly or other masses.   EXTREMITIES: No edema with normal capillary refill bilaterally.   RECTAL: The anus appears normal and digital rectal examination fails to reveal any induration consistent with perianal fistula or abscess and the prostate gland is not boggy  and not nodular. There is about a thumb thickness size swelling in the scrotal raphe going from the hemiscrotum down to the anus. It is about 3 or 4 cm long. There is a fairly sharp shelf at the scrotum such that the scrotum itself is not involved. As mentioned earlier, the anal canal is also not involved.   NEUROLOGIC: Cranial nerves II through XII, motor and sensation grossly intact.   PSYCHIATRIC: Alert and oriented x4. Appropriate affect.   LABORATORY, DIAGNOSTIC, AND RADIOLOGICAL  DATA: Glucose 238, BUN and creatinine 16 and 1.1. Electrolytes normal. Hepatic profile normal. White blood cell count 48.5, hemoglobin 12, hematocrit 36.8%, platelet count 326,000, normal differential. PT 27. INR 2.4. PTT 52. Urinalysis normal.  EKG not performed.   Lactic acid 1.8.   CT scan of the abdomen and pelvis with contrast is essentially normal with the exception of constipation, a 2 cm right external iliac lymph node, and nonspecific stranding in the subcutaneous fat of the perineum. There is no discrete fluid collection or gas to suggest abscess.   ASSESSMENT: Fournier's gangrene equivalent, early and clinically slowly resolving in a patient with no obvious perineal source such as a perianal fistula or abscess. The patient is a diabetic and has an extremely high white blood cell count.   PLAN:  1. Admit to the hospital for IV fluid and IV antibiotics and a bowel regimen to control his constipation.  2. I will hold his antiplatelet agents and his Coumadin in case he needs surgery (such as a diverting colostomy) but at this point I do not think surgery will be needed.   ____________________________ Consuela Mimes, MD wfm:drc D: 02/06/2012 23:01:16 ET T: 02/07/2012 06:08:48 ET JOB#: [id number] cc: Consuela Mimes, MD, <Dictator> Consuela Mimes MD ELECTRONICALLY SIGNED 02/09/2012 7:59

## 2014-07-08 NOTE — Discharge Summary (Signed)
PATIENT NAME:  Lucas Caldwell, Lucas Caldwell MR#:  [id number] DATE OF BIRTH:  1933-08-30  DATE OF ADMISSION:  05/17/2012 DATE OF DISCHARGE:  05/18/2012  ADMITTING DIAGNOSES: Dizziness, hyperglycemia.  DISCHARGE DIAGNOSES: 1.  Dizziness likely related to dehydration as a result of his hyperglycemia.  2.  Hyperglycemia as a result of recently being started on low dose insulin. The patient needs more insulin. His Lantus has been increased.  3.  Diabetes type 2 with poor control.  4.  History of atrial fibrillation, on chronic Coumadin.  5.  Hypertension.  6.  History of gout.  7.  Dyslipidemia.  8.  Gastroesophageal reflux disease.  9.  Hypothyroidism.  10.  Chronic leukocytosis, currently being followed by hematology.  CONSULTANTS: None.   PERTINENT LABS AND EVALUATIONS: Admitting BMP showed a glucose of 455, BUN 44, creatinine 1.83, sodium 123, potassium 4.9 and AST, ALT and alk phos were normal. WBC count 27.5 and hemoglobin 13.5. Urinalysis was negative. Most recent BMP: Glucose this morning was 220, BUN 38, creatinine 1.26, sodium 134 and potassium 4.2. His INR was 2.5.   Chest x-ray showed no acute cardiopulmonary processes.   HOSPITAL COURSE: Please refer to the H and P done by the admitting physician. The patient is a 81 year old African American male who has a history of diabetes and his sugar control was poor with blood sugars in the 200s so he was recently seen by his primary care provider approximately 7 days ago and was started on Lantus. The patient was also on glipizide, which apparently he stopped taking because he was not sure if he was to continue that or not. He started noticing that after starting his Lantus his blood sugar started staying high in the 400s. He also was urinating more frequently. He was feeling thirsty and he was feeling dizzy. Due to these symptoms he came to the ED. We were asked to admit the patient for severe hyperglycemia. He was also noted to have elevated  creatinine and hyponatremia. The patient was admitted, given IV fluids, and his Lantus dose was increased. With these interventions his blood sugars improved, in the 200 range. I explained to the patient that it may take a little adjustment before sugars are much better with Lantus. [name] currently not on any short-acting premeal insulin. I am not sure if the patient will be able to comply with 3 short-acting insulin shots a day along with the Lantus, so at this time I recommend that he increase his Lantus dose, at home keep a log and further adjustment will need to be done as an outpatient. [name] doing much better and is currently stable for discharge.   DISCHARGE MEDICATIONS: 1.  Metoprolol succinate 50 mg daily.  2.  Iron sulfate 324 mg 1 tab p.o. daily.  3.  Allopurinol 300 mg daily.  4.  Digoxin 125 mcg daily.  5.  Simvastatin 20 mg at bedtime.  6.  Warfarin 5 mg daily.  7.  Levoxyl 50 mcg 1/2 tab daily. 8.  Prednisone acetate 1 drop to right eye daily. 9.  Ilevro 0.3% ophthalmic suspension 1 drop to right eye once a day. 10.  Enalapril 5 mg daily.  11.  Lasix 40 mg 2 tablets once a day in the morning, 1 tablet a day at lunch. 12.  Omeprazole 20 mg daily.  13.  Spironolactone 25 mg p.o. daily. 14.   Lantus 18 units at bedtime.   HOME OXYGEN: None.   DIET: Low sodium, low fat,  low cholesterol, carbohydrate-consistent diet.   ACTIVITY: As tolerated.   DISCHARGE FOLLOWUP:  With primary MD in 1 to 2 weeks at [facility].   TIME SPENT ON DISCHARGE: 32 minutes. ____________________________ Lafonda Mosses Posey Pronto, MD shp:sb D: 05/18/2012 16:35:38 ET T: 05/19/2012 09:02:27 ET JOB#: [id number]  cc: Charliegh Vasudevan H. Posey Pronto, MD, <Dictator> Alric Seton MD ELECTRONICALLY SIGNED 05/20/2012 13:06

## 2014-07-08 NOTE — H&P (Signed)
DATE OF BIRTH:  May 20, 1933  DATE OF ADMISSION:  05/17/2012  PRIMARY CARE PHYSICIAN:  Dr. Juanda Chance at [facility]: Dizziness and high blood sugars.   HISTORY OF PRESENT ILLNESS: A 81 year old African-American male patient with history of atrial fibrillation on Coumadin, hypertension, type 2 diabetes mellitus, recently changed to Lantus 2 days back, presents to the Emergency Room complaining of dizziness, polyuria and uncontrolled blood sugars. The patient has had blood sugars in the 400s at home. Today, here in the Emergency Room, the patient's blood sugar is 459 with acute renal failure, creatinine elevated to 1.9 with baseline creatinine of 1.  He also has leukocytosis of 27,000.   He has had some dizziness with polyuria, feels extremely dry and has been drinking increased amount of water. No hematuria.  He does have polyuria, polydipsia.    He has seen Dr. [name] for persistent leukocytosis, is being worked up for possible CML.   PAST MEDICAL HISTORY:  Atrial fibrillation, on Coumadin, hypertension, gout, type 2 diabetes mellitus, dyslipidemia, GERD, status post umbilical hernia repair.   HOME MEDICATIONS INCLUDE:  1.  Acetaminophen hydrocodone 325/5, 1 tablet every 6 hours for pain.  2.  Allopurinol 300 mg once a day.  3.  Aspirin 81 mg daily.  4.  Digoxin 125 mcg daily.  6.  Ferrous gluconate 325 mg once a day.  7.  Lasix 20 mg daily.  8.  Levoxyl 50 mcg 1/2 tablet oral once a day.  9.  Metoprolol succinate 50 mg oral once a day.  10.  Omeprazole 20 mg oral daily.  11.  Simvastatin 20 mg daily.  12.  Warfarin 5 mg daily.  13.  Lantus 10 units subcu once a day at bedtime.   SOCIAL HISTORY: The patient is married, lives with his wife. 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   FAMILY HISTORY: Reviewed and unknown.   REVIEW OF SYSTEMS:  Please see History of Present Illness.  CONSTITUTIONAL:  Complains of some fatigue, weakness. No weight loss,  weight gain.  EYES:  No blurred vision, pain, redness.  EARS, NOSE, THROAT:  No hearing loss, tinnitus or nasal congestion.  CARDIOVASCULAR:  No chest pain, PND, orthopnea.  RESPIRATORY:  No shortness of breath, wheezing, hemoptysis.  GASTROINTESTINAL:  No nausea, vomiting, diarrhea.  GENITOURINARY:  Has frequency, polyuria.  ENDOCRINE:  Has diabetes. No thyroid problems.  MUSCULOSKELETAL: No arthritis or back pain.  HEMATOLOGY/LYMPHATIC: No anemia, easy bruising, bleeding. Has leukocytosis. Sees Dr. [name] for the same.  NEUROLOGIC: No focal numbness, weakness, dysarthria. Has some dizziness secondary to dehydration.  PSYCHIATRIC:  No anxiety or depression.   PHYSICAL EXAMINATION:  VITAL SIGNS:  Temperature 97.8, pulse 86, blood pressure 105/71, saturating 98% on room air.  GENERAL: Obese African-American male patient lying in bed, comfortable, conversational, cooperative with exam. PSYCHIATRIC:  Alert, oriented x 3. Mood and affect are appropriate. Judgment intact.  HEENT:  Atraumatic, normocephalic. Oral mucosa dry and pink. External ears and nose normal. No pallor. No icterus. Pupils bilaterally equal and reactive to light. No oral ulcers or thrush.  NECK:  Supple. No thyromegaly. No palpable lymph nodes. Trachea midline. No carotid bruit, JVD.  CARDIOVASCULAR:  S1, S2. Regular rate and rhythm, without any murmurs. Peripheral pulses 2+.  RESPIRATORY:  Normal work of breathing. Clear to auscultation on both sides.  GASTROINTESTINAL: Soft abdomen, nontender. Bowel sounds present. No hepatosplenomegaly palpable.  SKIN:  Warm and dry. No petechiae, rash, ulcers.  MUSCULOSKELETAL:  No joint swelling, redness, effusion of the large joints. Normal muscle tone.  NEUROLOGICAL: Motor strength 5/5 in upper and lower extremities. Sensation to fine touch intact all over. Cranial nerves II through XII intact.   LABORATORY STUDIES: Show a blood glucose of 455, BUN 44, creatinine 1.83, sodium 123,  potassium 4.9. AST, ALT, alkaline phosphatase normal. WBC 27.5, hemoglobin 13.5. Urinalysis shows no bacteria.   ASSESSMENT AND PLAN:   1.  Uncontrolled blood sugars. Will increase patient's Lantus from 10 units to 15 units, put him on sliding scale  insulin diabetic diet.  2.  Acute renal failure secondary to polyuria from the elevated blood sugars and dehydration. Start IV fluids and repeat BUN and creatinine in the morning.  3.  Hyponatremia secondary to dehydration. Should improve with the normal saline patient is on.  4.  Hypertension, well controlled. Continue home medications.  5.  Atrial fibrillation, on Coumadin and digoxin. Continue medications, presently normal sinus rhythm. INR is 2.5.  6.  Deep venous thrombosis prophylaxis with heparin.  7.  CODE STATUS:  FULL CODE.   Time spent today on this case was 65 minutes.     ____________________________ Leia Alf Sudini, MD srs:mr D: 05/17/2012 20:40:34 ET T: 05/17/2012 22:45:28 ET JOB#: [id number]  cc: Alveta Heimlich R. Sudini, MD, <Dictator> Neita Carp MD ELECTRONICALLY SIGNED 05/18/2012 14:54

## 2014-07-16 ENCOUNTER — Emergency Department
Admit: 2014-07-16 | Disposition: A | Discharge status: Home / Self Care | Payer: Self-pay | Admitting: Emergency Medicine

## 2014-11-04 ENCOUNTER — Emergency Department
Admission: EM | Admit: 2014-11-04 | Discharge: 2014-11-04 | Disposition: A | Discharge status: Home / Self Care | Payer: Medicare Other | Attending: Emergency Medicine | Admitting: Emergency Medicine

## 2014-11-04 ENCOUNTER — Encounter: Payer: Self-pay | Admitting: *Deleted

## 2014-11-04 ENCOUNTER — Emergency Department: Payer: Medicare Other

## 2014-11-04 DIAGNOSIS — Z87891 Personal history of nicotine dependence: Secondary | ICD-10-CM | POA: Insufficient documentation

## 2014-11-04 DIAGNOSIS — E119 Type 2 diabetes mellitus without complications: Secondary | ICD-10-CM | POA: Diagnosis not present

## 2014-11-04 DIAGNOSIS — J209 Acute bronchitis, unspecified: Secondary | ICD-10-CM | POA: Insufficient documentation

## 2014-11-04 DIAGNOSIS — J40 Bronchitis, not specified as acute or chronic: Secondary | ICD-10-CM

## 2014-11-04 DIAGNOSIS — R0602 Shortness of breath: Secondary | ICD-10-CM | POA: Diagnosis present

## 2014-11-04 DIAGNOSIS — I10 Essential (primary) hypertension: Secondary | ICD-10-CM | POA: Insufficient documentation

## 2014-11-04 HISTORY — DX: Other specified disorders of breast: N64.89

## 2014-11-04 HISTORY — DX: Gout, unspecified: M10.9

## 2014-11-04 HISTORY — DX: Essential (primary) hypertension: I10

## 2014-11-04 HISTORY — DX: Hyperlipidemia, unspecified: E78.5

## 2014-11-04 MED ORDER — ALBUTEROL SULFATE HFA 108 (90 BASE) MCG/ACT IN AERS: 2.0000 | INHALATION_SPRAY | Freq: Four times a day (QID) | RESPIRATORY_TRACT | Status: DC | PRN

## 2014-11-04 MED ORDER — OPTICHAMBER ADVANTAGE MISC: 1.0000 | Freq: Once | Status: DC

## 2014-11-04 MED ORDER — ALBUTEROL SULFATE (2.5 MG/3ML) 0.083% IN NEBU: 2.5000 mg | INHALATION_SOLUTION | Freq: Four times a day (QID) | RESPIRATORY_TRACT | Status: DC | PRN

## 2014-11-04 NOTE — Discharge Instructions (Signed)
You appear to have bronchitis, a viral infection that moved into your lungs from a cold.  You have taken some antibiotics which did not help - antibiotics help with bacteria, not with viruses. You may use albuterol with the spacer device. This may help you off out more of the congestion that you have and make it easier for you to breathe. Follow-up with your regular doctor this coming week as planned. Return to the emergency department if you have any worsening shortness of breath, chest pain, or other urgent concerns.  Upper Respiratory Infection, Adult An upper respiratory infection (URI) is also known as the common cold. It is often caused by a type of germ (virus). Colds are easily spread (contagious). You can pass it to others by kissing, coughing, sneezing, or drinking out of the same glass. Usually, you get better in 1 or 2 weeks.  HOME CARE  1. Only take medicine as told by your doctor. 2. Use a warm mist humidifier or breathe in steam from a hot shower. 3. Drink enough water and fluids to keep your pee (urine) clear or pale yellow. 4. Get plenty of rest. 5. Return to work when your temperature is back to normal or as told by your doctor. You may use a face mask and wash your hands to stop your cold from spreading. GET HELP RIGHT AWAY IF:   After the first few days, you feel you are getting worse.  You have questions about your medicine.  You have chills, shortness of breath, or brown or red spit (mucus).  You have yellow or brown snot (nasal discharge) or pain in the face, especially when you bend forward.  You have a fever, puffy (swollen) neck, pain when you swallow, or white spots in the back of your throat.  You have a bad headache, ear pain, sinus pain, or chest pain.  You have a high-pitched whistling sound when you breathe in and out (wheezing).  You have a lasting cough or cough up blood.  You have sore muscles or a stiff neck. MAKE SURE YOU:   Understand these  instructions.  Will watch your condition.  Will get help right away if you are not doing well or get worse. Document Released: 08/21/2007 Document Revised: 05/27/2011 Document Reviewed: 06/09/2013 [facility] Patient Information 2015 [place], [place]. This information is not intended to replace advice given to you by your health care provider. Make sure you discuss any questions you have with your health care provider.  Metered Dose Inhaler with Spacer Inhaled medicines are the basis of treatment of asthma and other breathing problems. Inhaled medicine can only be effective if used properly. Good technique assures that the medicine reaches the lungs. Your health care provider has asked you to use a spacer with your inhaler to help you take the medicine more effectively. A spacer is a plastic tube with a mouthpiece on one end and an opening that connects to the inhaler on the other end. Metered dose inhalers (MDIs) are used to deliver a variety of inhaled medicines. These include quick relief or rescue medicines (such as bronchodilators) and controller medicines (such as corticosteroids). The medicine is delivered by pushing down on a metal canister to release a set amount of spray. If you are using different kinds of inhalers, use your quick relief medicine to open the airways 10-15 minutes before using a steroid if instructed to do so by your health care provider. If you are unsure which inhalers to use and the  order of using them, ask your health care provider, nurse, or respiratory therapist. HOW TO USE THE INHALER WITH A SPACER 6. Remove cap from inhaler. 7. If you are using the inhaler for the first time, you will need to prime it. Shake the inhaler for 5 seconds and release four puffs into the air, away from your face. Ask your health care provider or pharmacist if you have questions about priming your inhaler. 8. Shake inhaler for 5 seconds before each breath in (inhalation). 9. Place the open end  of the spacer onto the mouthpiece of the inhaler. 10. Position the inhaler so that the top of the canister faces up and the spacer mouthpiece faces you. 11. Put your index finger on the top of the medicine canister. Your thumb supports the bottom of the inhaler and the spacer. 12. Breathe out (exhale) normally and as completely as possible. 13. Immediately after exhaling, place the spacer between your teeth and into your mouth. Close your mouth tightly around the spacer. 14. Press the canister down with the index finger to release the medicine. 15. At the same time as the canister is pressed, inhale deeply and slowly until the lungs are completely filled. This should take 4-6 seconds. Keep your tongue down and out of the way. 16. Hold the medicine in your lungs for 5-10 seconds (10 seconds is best). This helps the medicine get into the small airways of your lungs. Exhale. 17. Repeat inhaling deeply through the spacer mouthpiece. Again hold that breath for up to 10 seconds (10 seconds is best). Exhale slowly. If it is difficult to take this second deep breath through the spacer, breathe normally several times through the spacer. Remove the spacer from your mouth. 18. Wait at least 15-30 seconds between puffs. Continue with the above steps until you have taken the number of puffs your health care provider has ordered. Do not use the inhaler more than your health care provider directs you to. 19. Remove spacer from the inhaler and place cap on inhaler. 20. Follow the directions from your health care provider or the inhaler insert for cleaning the inhaler and spacer. If you are using a steroid inhaler, rinse your mouth with water after your last puff, gargle, and spit out the water. Do not swallow the water. AVOID:  Inhaling before or after starting the spray of medicine. It takes practice to coordinate your breathing with triggering the spray.  Inhaling through the nose (rather than the mouth) when  triggering the spray. HOW TO DETERMINE IF YOUR INHALER IS FULL OR NEARLY EMPTY You cannot know when an inhaler is empty by shaking it. A few inhalers are now being made with dose counters. Ask your health care provider for a prescription that has a dose counter if you feel you need that extra help. If your inhaler does not have a counter, ask your health care provider to help you determine the date you need to refill your inhaler. Write the refill date on a calendar or your inhaler canister. Refill your inhaler 7-10 days before it runs out. Be sure to keep an adequate supply of medicine. This includes making sure it is not expired, and you have a spare inhaler.  SEEK MEDICAL CARE IF:   Symptoms are only partially relieved with your inhaler.  You are having trouble using your inhaler.  You experience some increase in phlegm. SEEK IMMEDIATE MEDICAL CARE IF:   You feel little or no relief with your inhalers. You are  still wheezing and are feeling shortness of breath or tightness in your chest or both.  You have dizziness, headaches, or fast heart rate.  You have chills, fever, or night sweats.  There is a noticeable increase in phlegm production, or there is blood in the phlegm. Document Released: 03/04/2005 Document Revised: 07/19/2013 Document Reviewed: 08/20/2012 [facility] Patient Information 2015 [place], [place]. This information is not intended to replace advice given to you by your health care provider. Make sure you discuss any questions you have with your health care provider.

## 2014-11-04 NOTE — ED Notes (Signed)
Patient and wife states that the patient has been feeling ill for 3 weeks with poor appetite, productive cough and increased shortness of breath. Patient has an existing appointment with his PMD on next Thursday but feels like he can't wait. Patient denies pain at this time.

## 2014-11-04 NOTE — ED Provider Notes (Signed)
[facility] Emergency Department Provider Note  ____________________________________________  Time seen: 2005  I have reviewed the triage vital signs and the nursing notes.   HISTORY  Chief Complaint Shortness of Breath  cough    HPI Lucas Caldwell is a 81 y.o. male who reports he began to have nasal congestion and sore throat approximately 3 weeks ago. This moved into his lungs and he has had a cough and mild shortness of breath over 2 weeks. He has been seen by doctors at [facility]. He was prescribed amoxicillin for this likely viral infection. The patient's wife told me that they drew blood for him as well during that evaluation.  He presents the emergency department today because he feels the amoxicillin did not help him. He feels his symptoms are lingering and possibly getting worse. He has an appointment next week with his regular doctor.  He denies any chest pain. He does have a history of diabetes, hypertension, and cardiac disease.    Past Medical History  Diagnosis Date  . CHF (congestive heart failure)   . Diabetes mellitus without complication   . Hypertension   . Cancer     Chronic myeloid leukemia  . Gout   . Breast asymmetry     Left breast is larger, present for several years.  . Thyroid disease   . Hyperlipemia     There are no active problems to display for this patient.   Past Surgical History  Procedure Laterality Date  . Cardiac catheterization      2 stents  . Hernia repair      Current Outpatient Rx  Name  Route  Sig  Dispense  Refill  . albuterol (PROVENTIL) (2.5 MG/3ML) 0.083% nebulizer solution   Nebulization   Take 3 mLs (2.5 mg total) by nebulization every 6 (six) hours as needed for wheezing or shortness of breath.   75 mL   12   . Spacer/Aero-Holding Chambers (OPTICHAMBER ADVANTAGE) MISC   Other   1 each by Other route once.   1 each   0     Allergies Plavix  No family history on  file.  Social History Social History  Substance Use Topics  . Smoking status: Former Research scientist (life sciences)  . Smokeless tobacco: None  . Alcohol Use: No    Review of Systems  Constitutional: Negative for fever. ENT: Negative for sore throat. Cardiovascular: Negative for chest pain. Respiratory: Positive for dyspnea on exertion, mild, with cough. Gastrointestinal: Negative for abdominal pain, vomiting and diarrhea. Genitourinary: Negative for dysuria. Musculoskeletal: No myalgias or injuries. Skin: Negative for rash. Neurological: Negative for headaches   10-point ROS otherwise negative.  ____________________________________________   PHYSICAL EXAM:  VITAL SIGNS: ED Triage Vitals  Enc Vitals Group     BP 11/04/14 1700 107/47 mmHg     Pulse Rate 11/04/14 1700 67     Resp 11/04/14 1700 20     Temp 11/04/14 1700 98.2 F (36.8 C)     Temp Source 11/04/14 1700 Oral     SpO2 11/04/14 1700 100 %     Weight 11/04/14 1700 191 lb (86.637 kg)     Height 11/04/14 1700 5\' 4"  (1.626 m)     Head Cir --      Peak Flow --      Pain Score --      Pain Loc --      Pain Edu? --      Excl. in [place]? --  Constitutional: Alert and oriented. Well appearing and in no distress. ENT   Head: Normocephalic and atraumatic.   Nose: No congestion/rhinnorhea.   Mouth/Throat: Mucous membranes are moist. Cardiovascular: Normal rate, regular rhythm, no murmur noted Respiratory:  Normal respiratory effort, no tachypnea.    Breath sounds are clear and equal bilaterally.  Gastrointestinal: Soft and nontender. No distention.  Back: No muscle spasm, no tenderness, no CVA tenderness. Musculoskeletal: No deformity noted. Nontender with normal range of motion in all extremities.  No noted edema. Neurologic:  Normal speech and language. No gross focal neurologic deficits are appreciated.  Skin:  Skin is warm, dry. No rash noted. Psychiatric: Mood and affect are normal. Speech and behavior are normal.   ____________________________________________   EKG  ED ECG REPORT I, Rinda Rollyson W, the attending physician, personally viewed and interpreted this ECG.   Date: 11/04/2014  EKG Time: 1720  Rate: 63  Rhythm: Appears to be a sinus rhythm with a prolongation of the PR interval.   Axis: Normal  Intervals: No computer-generated PR interval, however the spacing appears somewhat long. QRS is 156.  ST&T Change: None noted   ____________________________________________    RADIOLOGY  Chest x-ray:  No active cardiopulmonary disease.   ____________________________________________   INITIAL IMPRESSION / ASSESSMENT AND PLAN / ED COURSE  Pertinent labs & imaging results that were available during my care of the patient were reviewed by me and considered in my medical decision making (see chart for details).  Well-appearing 81 year old male in no acute distress. [name] alert and comfortable. [name] breathing well. His chest x-ray is unremarkable.   I discussed the option of further blood testing. The patient is declining a blood draw. His wife reports he does have blood tested at a recent visit at [facility].  He appears to have a viral bronchitis. He has been treated with amoxicillin without benefit. I do not see a benefit in treating him with another round of antibiotics.  He has an appointment coming up with his primary physician at [facility]. I've encouraged him to follow-up with his primary doctor and to return to the emergency department if he has worsening shortness of breath, chest pain, or other urgent concerns.  ____________________________________________   FINAL CLINICAL IMPRESSION(S) / ED DIAGNOSES  Final diagnoses:  Bronchitis      Ahmed Prima, MD 11/04/14 2028

## 2015-02-24 ENCOUNTER — Encounter (INDEPENDENT_AMBULATORY_CARE_PROVIDER_SITE_OTHER): Payer: Self-pay | Admitting: Ophthalmology

## 2015-03-07 ENCOUNTER — Encounter (INDEPENDENT_AMBULATORY_CARE_PROVIDER_SITE_OTHER): Payer: Self-pay | Admitting: Ophthalmology

## 2015-03-09 ENCOUNTER — Encounter (INDEPENDENT_AMBULATORY_CARE_PROVIDER_SITE_OTHER): Payer: Medicare Other | Admitting: Ophthalmology

## 2015-03-09 DIAGNOSIS — E11311 Type 2 diabetes mellitus with unspecified diabetic retinopathy with macular edema: Secondary | ICD-10-CM | POA: Diagnosis not present

## 2015-03-09 DIAGNOSIS — H35033 Hypertensive retinopathy, bilateral: Secondary | ICD-10-CM | POA: Diagnosis not present

## 2015-03-09 DIAGNOSIS — E113213 Type 2 diabetes mellitus with mild nonproliferative diabetic retinopathy with macular edema, bilateral: Secondary | ICD-10-CM

## 2015-03-09 DIAGNOSIS — H43813 Vitreous degeneration, bilateral: Secondary | ICD-10-CM

## 2015-03-09 DIAGNOSIS — H35342 Macular cyst, hole, or pseudohole, left eye: Secondary | ICD-10-CM

## 2015-03-09 DIAGNOSIS — H47211 Primary optic atrophy, right eye: Secondary | ICD-10-CM

## 2015-04-19 ENCOUNTER — Encounter: Payer: Self-pay | Admitting: *Deleted

## 2015-04-19 ENCOUNTER — Emergency Department
Admission: EM | Admit: 2015-04-19 | Discharge: 2015-04-19 | Disposition: A | Discharge status: Home / Self Care | Payer: Medicare HMO | Attending: Student | Admitting: Student

## 2015-04-19 DIAGNOSIS — I10 Essential (primary) hypertension: Secondary | ICD-10-CM | POA: Insufficient documentation

## 2015-04-19 DIAGNOSIS — S40021A Contusion of right upper arm, initial encounter: Secondary | ICD-10-CM | POA: Diagnosis not present

## 2015-04-19 DIAGNOSIS — E162 Hypoglycemia, unspecified: Secondary | ICD-10-CM

## 2015-04-19 DIAGNOSIS — E11649 Type 2 diabetes mellitus with hypoglycemia without coma: Secondary | ICD-10-CM | POA: Diagnosis present

## 2015-04-19 DIAGNOSIS — R58 Hemorrhage, not elsewhere classified: Secondary | ICD-10-CM

## 2015-04-19 DIAGNOSIS — Y9389 Activity, other specified: Secondary | ICD-10-CM | POA: Insufficient documentation

## 2015-04-19 DIAGNOSIS — W1839XA Other fall on same level, initial encounter: Secondary | ICD-10-CM | POA: Diagnosis not present

## 2015-04-19 DIAGNOSIS — Y9289 Other specified places as the place of occurrence of the external cause: Secondary | ICD-10-CM | POA: Insufficient documentation

## 2015-04-19 DIAGNOSIS — Z87891 Personal history of nicotine dependence: Secondary | ICD-10-CM | POA: Insufficient documentation

## 2015-04-19 DIAGNOSIS — D649 Anemia, unspecified: Secondary | ICD-10-CM | POA: Diagnosis not present

## 2015-04-19 DIAGNOSIS — Y998 Other external cause status: Secondary | ICD-10-CM | POA: Diagnosis not present

## 2015-04-19 LAB — BASIC METABOLIC PANEL
Anion gap: 9 (ref 5–15)
BUN: 22 mg/dL — AB (ref 6–20)
CALCIUM: 9.1 mg/dL (ref 8.9–10.3)
CO2: 23 mmol/L (ref 22–32)
CREATININE: 1.35 mg/dL — AB (ref 0.61–1.24)
Chloride: 99 mmol/L — ABNORMAL LOW (ref 101–111)
GFR calc non Af Amer: 48 mL/min — ABNORMAL LOW (ref >60)
GFR, EST AFRICAN AMERICAN: 56 mL/min — AB (ref >60)
Glucose, Bld: 194 mg/dL — ABNORMAL HIGH (ref 65–99)
Potassium: 4.8 mmol/L (ref 3.5–5.1)
SODIUM: 131 mmol/L — AB (ref 135–145)

## 2015-04-19 LAB — CBC
HCT: 31 % — ABNORMAL LOW (ref 40.0–52.0)
Hemoglobin: 10.5 g/dL — ABNORMAL LOW (ref 13.0–18.0)
MCH: 31.7 pg (ref 26.0–34.0)
MCHC: 34 g/dL (ref 32.0–36.0)
MCV: 93.4 fL (ref 80.0–100.0)
PLATELETS: 198 10*3/uL (ref 150–440)
RBC: 3.32 MIL/uL — AB (ref 4.40–5.90)
RDW: 15.9 % — AB (ref 11.5–14.5)
WBC: 8.8 10*3/uL (ref 3.8–10.6)

## 2015-04-19 LAB — PROTIME-INR
INR: 2.23
PROTHROMBIN TIME: 24.5 s — AB (ref 11.4–15.0)

## 2015-04-19 LAB — APTT: aPTT: 33 seconds (ref 24–36)

## 2015-04-19 LAB — GLUCOSE, CAPILLARY
GLUCOSE-CAPILLARY: 123 mg/dL — AB (ref 65–99)
Glucose-Capillary: 183 mg/dL — ABNORMAL HIGH (ref 65–99)

## 2015-04-19 NOTE — ED Notes (Addendum)
Pt reports having episodes of hypoglycemia, pt had a fall last week due to hypoglycemia with cramping , injured left arm, pt denies hitting head or LOC

## 2015-04-19 NOTE — ED Provider Notes (Signed)
[facility] Emergency Department Provider Note  ____________________________________________  Time seen: Approximately 3:43 PM  I have reviewed the triage vital signs and the nursing notes.   HISTORY  Chief Complaint Hypoglycemia    HPI Lucas Caldwell is a 82 y.o. male with atrial fibrillation on Coumadin, hypertension, type 2 diabetes mellitus, CML and oral chemotherapy who presents for evaluation of hypoglycemia last night, sudden onset, now resolved, improved with food. Patient reports that he did not have much of an appetite last night but he took his Lantus anyway. He awoke in the middle the night sweating, checked his blood sugar and it was 69. He ate some food, drink some juice and felt much better however this morning when he awoke and checked his blood sugar it was "really really high like 180 something". This concerned him so he presented to the emergency department. He also notes that he had a mechanical fall 8 days ago where his "feet got tangled up" and he fell onto the right arm. He has had a bruise on his arm since that time. He did not hit his head or lose consciousness. [name] having no chest pain, vomiting, diarrhea, fevers, chills or shortness of breath. No recent changes in his insulin dose.    Past Medical History  Diagnosis Date  . CHF (congestive heart failure) ([place])   . Diabetes mellitus without complication ([place])   . Hypertension   . Cancer ([place])     Chronic myeloid leukemia  . Gout   . Breast asymmetry     Left breast is larger, present for several years.  . Thyroid disease   . Hyperlipemia     There are no active problems to display for this patient.   Past Surgical History  Procedure Laterality Date  . Cardiac catheterization      2 stents  . Hernia repair      Current Outpatient Rx  Name  Route  Sig  Dispense  Refill  . albuterol (PROVENTIL HFA;VENTOLIN HFA) 108 (90 BASE) MCG/ACT inhaler   Inhalation   Inhale 2  puffs into the lungs every 6 (six) hours as needed for wheezing or shortness of breath.   1 Inhaler   2   . Spacer/Aero-Holding Chambers (OPTICHAMBER ADVANTAGE) MISC   Other   1 each by Other route once.   1 each   0     Allergies Plavix  No family history on file.  Social History Social History  Substance Use Topics  . Smoking status: Former Research scientist (life sciences)  . Smokeless tobacco: None  . Alcohol Use: No    Review of Systems Constitutional: No fever/chills Eyes: No visual changes. ENT: No sore throat. Cardiovascular: Denies chest pain. Respiratory: Denies shortness of breath. Gastrointestinal: No abdominal pain.  No nausea, no vomiting.  No diarrhea.  No constipation. Genitourinary: Negative for dysuria. Musculoskeletal: Negative for back pain. Skin: Negative for rash. Neurological: Negative for headaches, focal weakness or numbness.  10-point ROS otherwise negative.  ____________________________________________   PHYSICAL EXAM:  VITAL SIGNS: ED Triage Vitals  Enc Vitals Group     BP 04/19/15 1241 129/57 mmHg     Pulse Rate 04/19/15 1241 77     Resp 04/19/15 1241 18     Temp 04/19/15 1241 98.4 F (36.9 C)     Temp Source 04/19/15 1241 Oral     SpO2 04/19/15 1241 100 %     Weight 04/19/15 1241 198 lb (89.812 kg)     Height  04/19/15 1241 5\' 3"  (1.6 m)     Head Cir --      Peak Flow --      Pain Score --      Pain Loc --      Pain Edu? --      Excl. in [place]? --     Constitutional: Alert and oriented. Well appearing and in no acute distress. Eyes: Conjunctivae are normal. PERRL. EOMI. Head: Atraumatic. Nose: No congestion/rhinnorhea. Mouth/Throat: Mucous membranes are moist.  Oropharynx non-erythematous. Neck: No stridor.   Cardiovascular: Normal rate, regular rhythm. Grossly normal heart sounds.  Good peripheral circulation. Respiratory: Normal respiratory effort.  No retractions. Lungs CTAB. Gastrointestinal: Soft and nontender. No distention.  No CVA  tenderness. Genitourinary: deferred Musculoskeletal: No lower extremity tenderness nor edema.  No joint effusions. Healing ecchymosis of the right upper arm without any tenderness or induration. Full range of motion at the right shoulder and right elbow. 2+ right radial pulse, radial median and ulnar nerve intact in the right arm. Neurologic:  Normal speech and language. No gross focal neurologic deficits are appreciated. No gait instability. Skin:  Skin is warm, dry and intact. No rash noted. Psychiatric: Mood and affect are normal. Speech and behavior are normal.  ____________________________________________   LABS (all labs ordered are listed, but only abnormal results are displayed)  Labs Reviewed  GLUCOSE, CAPILLARY - Abnormal; Notable for the following:    Glucose-Capillary 183 (*)    All other components within normal limits  BASIC METABOLIC PANEL - Abnormal; Notable for the following:    Sodium 131 (*)    Chloride 99 (*)    Glucose, Bld 194 (*)    BUN 22 (*)    Creatinine, Ser 1.35 (*)    GFR calc non Af Amer 48 (*)    GFR calc Af Amer 56 (*)    All other components within normal limits  CBC - Abnormal; Notable for the following:    RBC 3.32 (*)    Hemoglobin 10.5 (*)    HCT 31.0 (*)    RDW 15.9 (*)    All other components within normal limits  PROTIME-INR - Abnormal; Notable for the following:    Prothrombin Time 24.5 (*)    All other components within normal limits  APTT  URINALYSIS COMPLETEWITH MICROSCOPIC ([facility] ONLY)   ____________________________________________  EKG  ED ECG REPORT I, Joanne Gavel, the attending physician, personally viewed and interpreted this ECG.   Date: 04/19/2015  EKG Time: 12:46  Rate: 79  Rhythm: unchanged from previous tracings, normal sinus rhythm  Axis: normal  Intervals:left anterior fascicular block, right bundle-branch block  ST&T Change: No acute ST elevation. EKG unchanged from  11/04/2014  ____________________________________________  RADIOLOGY  none ____________________________________________   PROCEDURES  Procedure(s) performed: None  Critical Care performed: No  ____________________________________________   INITIAL IMPRESSION / ASSESSMENT AND PLAN / ED COURSE  Pertinent labs & imaging results that were available during my care of the patient were reviewed by me and considered in my medical decision making (see chart for details).  Lucas DONOHOE is a 82 y.o. male with atrial fibrillation on Coumadin, hypertension, type 2 diabetes mellitus, CML and oral chemotherapy who presents for evaluation of hypoglycemia last night, sudden onset, now resolved, improved with food. On exam, he is very well-appearing and in no acute distress. Vital signs stable, he is afebrile. He does have a healing ecchymosis in the right upper arm without any evidence of expanding hematoma. His  glucose is stable here at 183, 194. Creatinine is mildly elevated at 1.35 but this appears to be his baseline. His hemoglobin is 10.5 today. There is no hemoglobin on file for the past 3 or 4 years for comparison. The patient denies any blood in stools or any dark or tarry stools. In truth, I am unsure of what his most recent baseline is but I encouraged him to follow up with his primary care doctor within the next 2-3 days for recheck. His INR is therapeutic at 2.23. He appears well and is requesting discharge. We discussed that he should eat or at minimum drinking a boost or ensure when he doesn't feel like eating and is going to take his Lantus so that he doesn't experience hypoglycemic spells in the middle of the night. Discussed with Signa's return precautions with the patient and his wife at bedside and they're comfortable with the discharge plan. DC home. ____________________________________________   FINAL CLINICAL IMPRESSION(S) / ED DIAGNOSES  Final diagnoses:  Hypoglycemia   Ecchymosis  Anemia, unspecified anemia type      Joanne Gavel, MD 04/19/15 1642

## 2015-08-22 ENCOUNTER — Ambulatory Visit: Payer: Medicare Other | Admitting: Sports Medicine

## 2015-09-26 DIAGNOSIS — E08621 Diabetes mellitus due to underlying condition with foot ulcer: Secondary | ICD-10-CM | POA: Insufficient documentation

## 2015-09-26 DIAGNOSIS — L97514 Non-pressure chronic ulcer of other part of right foot with necrosis of bone: Secondary | ICD-10-CM | POA: Insufficient documentation

## 2015-09-26 DIAGNOSIS — L97511 Non-pressure chronic ulcer of other part of right foot limited to breakdown of skin: Secondary | ICD-10-CM

## 2015-10-03 DIAGNOSIS — M869 Osteomyelitis, unspecified: Secondary | ICD-10-CM | POA: Insufficient documentation

## 2015-11-27 ENCOUNTER — Encounter (INDEPENDENT_AMBULATORY_CARE_PROVIDER_SITE_OTHER): Payer: Medicare HMO | Admitting: Ophthalmology

## 2015-11-27 DIAGNOSIS — I10 Essential (primary) hypertension: Secondary | ICD-10-CM

## 2015-11-27 DIAGNOSIS — E11319 Type 2 diabetes mellitus with unspecified diabetic retinopathy without macular edema: Secondary | ICD-10-CM | POA: Diagnosis not present

## 2015-11-27 DIAGNOSIS — H35033 Hypertensive retinopathy, bilateral: Secondary | ICD-10-CM | POA: Diagnosis not present

## 2015-11-27 DIAGNOSIS — H47211 Primary optic atrophy, right eye: Secondary | ICD-10-CM | POA: Diagnosis not present

## 2015-11-27 DIAGNOSIS — H35342 Macular cyst, hole, or pseudohole, left eye: Secondary | ICD-10-CM | POA: Diagnosis not present

## 2015-11-27 DIAGNOSIS — H43813 Vitreous degeneration, bilateral: Secondary | ICD-10-CM | POA: Diagnosis not present

## 2015-11-27 DIAGNOSIS — H35372 Puckering of macula, left eye: Secondary | ICD-10-CM

## 2015-11-27 DIAGNOSIS — E113293 Type 2 diabetes mellitus with mild nonproliferative diabetic retinopathy without macular edema, bilateral: Secondary | ICD-10-CM | POA: Diagnosis not present

## 2015-12-08 ENCOUNTER — Inpatient Hospital Stay
Admission: EM | Admit: 2015-12-08 | Discharge: 2015-12-11 | DRG: 683 | Disposition: A | Discharge status: Home / Self Care | Payer: Medicare HMO | Attending: Internal Medicine | Admitting: Internal Medicine

## 2015-12-08 ENCOUNTER — Emergency Department: Payer: Medicare HMO

## 2015-12-08 ENCOUNTER — Encounter: Payer: Self-pay | Admitting: *Deleted

## 2015-12-08 DIAGNOSIS — Z79899 Other long term (current) drug therapy: Secondary | ICD-10-CM | POA: Diagnosis not present

## 2015-12-08 DIAGNOSIS — Z7982 Long term (current) use of aspirin: Secondary | ICD-10-CM | POA: Diagnosis not present

## 2015-12-08 DIAGNOSIS — C9210 Chronic myeloid leukemia, BCR/ABL-positive, not having achieved remission: Secondary | ICD-10-CM | POA: Diagnosis present

## 2015-12-08 DIAGNOSIS — I110 Hypertensive heart disease with heart failure: Secondary | ICD-10-CM | POA: Diagnosis present

## 2015-12-08 DIAGNOSIS — Z89411 Acquired absence of right great toe: Secondary | ICD-10-CM

## 2015-12-08 DIAGNOSIS — I482 Chronic atrial fibrillation: Secondary | ICD-10-CM | POA: Diagnosis present

## 2015-12-08 DIAGNOSIS — Z7901 Long term (current) use of anticoagulants: Secondary | ICD-10-CM | POA: Diagnosis not present

## 2015-12-08 DIAGNOSIS — Z23 Encounter for immunization: Secondary | ICD-10-CM | POA: Diagnosis not present

## 2015-12-08 DIAGNOSIS — I4510 Unspecified right bundle-branch block: Secondary | ICD-10-CM | POA: Diagnosis present

## 2015-12-08 DIAGNOSIS — R531 Weakness: Secondary | ICD-10-CM | POA: Diagnosis present

## 2015-12-08 DIAGNOSIS — Z794 Long term (current) use of insulin: Secondary | ICD-10-CM

## 2015-12-08 DIAGNOSIS — E785 Hyperlipidemia, unspecified: Secondary | ICD-10-CM | POA: Diagnosis present

## 2015-12-08 DIAGNOSIS — Z888 Allergy status to other drugs, medicaments and biological substances status: Secondary | ICD-10-CM

## 2015-12-08 DIAGNOSIS — N170 Acute kidney failure with tubular necrosis: Secondary | ICD-10-CM | POA: Diagnosis not present

## 2015-12-08 DIAGNOSIS — E860 Dehydration: Secondary | ICD-10-CM | POA: Diagnosis present

## 2015-12-08 DIAGNOSIS — M1A9XX0 Chronic gout, unspecified, without tophus (tophi): Secondary | ICD-10-CM | POA: Diagnosis present

## 2015-12-08 DIAGNOSIS — R630 Anorexia: Secondary | ICD-10-CM | POA: Diagnosis not present

## 2015-12-08 DIAGNOSIS — E119 Type 2 diabetes mellitus without complications: Secondary | ICD-10-CM | POA: Diagnosis present

## 2015-12-08 DIAGNOSIS — Z87891 Personal history of nicotine dependence: Secondary | ICD-10-CM | POA: Diagnosis not present

## 2015-12-08 DIAGNOSIS — N289 Disorder of kidney and ureter, unspecified: Secondary | ICD-10-CM | POA: Diagnosis not present

## 2015-12-08 DIAGNOSIS — I5022 Chronic systolic (congestive) heart failure: Secondary | ICD-10-CM | POA: Diagnosis present

## 2015-12-08 DIAGNOSIS — N179 Acute kidney failure, unspecified: Secondary | ICD-10-CM

## 2015-12-08 LAB — BASIC METABOLIC PANEL
Anion gap: 8 (ref 5–15)
BUN: 34 mg/dL — ABNORMAL HIGH (ref 6–20)
CALCIUM: 9.1 mg/dL (ref 8.9–10.3)
CO2: 21 mmol/L — ABNORMAL LOW (ref 22–32)
CREATININE: 2.21 mg/dL — AB (ref 0.61–1.24)
Chloride: 102 mmol/L (ref 101–111)
GFR, EST AFRICAN AMERICAN: 31 mL/min — AB (ref >60)
GFR, EST NON AFRICAN AMERICAN: 26 mL/min — AB (ref >60)
Glucose, Bld: 214 mg/dL — ABNORMAL HIGH (ref 65–99)
Potassium: 4.4 mmol/L (ref 3.5–5.1)
SODIUM: 131 mmol/L — AB (ref 135–145)

## 2015-12-08 LAB — URINALYSIS COMPLETE WITH MICROSCOPIC (ARMC ONLY)
BILIRUBIN URINE: NEGATIVE
Glucose, UA: NEGATIVE mg/dL
Hgb urine dipstick: NEGATIVE
KETONES UR: NEGATIVE mg/dL
LEUKOCYTES UA: NEGATIVE
NITRITE: NEGATIVE
PH: 5 (ref 5.0–8.0)
Protein, ur: NEGATIVE mg/dL
RBC / HPF: NONE SEEN RBC/hpf (ref 0–5)
SPECIFIC GRAVITY, URINE: 1.006 (ref 1.005–1.030)
Squamous Epithelial / LPF: NONE SEEN

## 2015-12-08 LAB — PROTIME-INR
INR: 1.96
PROTHROMBIN TIME: 22.6 s — AB (ref 11.4–15.2)

## 2015-12-08 LAB — CBC
HCT: 29.2 % — ABNORMAL LOW (ref 40.0–52.0)
Hemoglobin: 9.9 g/dL — ABNORMAL LOW (ref 13.0–18.0)
MCH: 31.6 pg (ref 26.0–34.0)
MCHC: 33.9 g/dL (ref 32.0–36.0)
MCV: 93.3 fL (ref 80.0–100.0)
PLATELETS: 263 10*3/uL (ref 150–440)
RBC: 3.13 MIL/uL — AB (ref 4.40–5.90)
RDW: 14.5 % (ref 11.5–14.5)
WBC: 9.6 10*3/uL (ref 3.8–10.6)

## 2015-12-08 LAB — TROPONIN I: TROPONIN I: 0.03 ng/mL — AB (ref <0.03)

## 2015-12-08 MED ORDER — ACETAMINOPHEN 650 MG RE SUPP: 650.0000 mg | Freq: Four times a day (QID) | RECTAL | Status: DC | PRN

## 2015-12-08 MED ORDER — INSULIN ASPART 100 UNIT/ML ~~LOC~~ SOLN
0.0000 [IU] | Freq: Three times a day (TID) | SUBCUTANEOUS | Status: DC
  Administered 2015-12-09: 18:00:00 1 [IU] via SUBCUTANEOUS
  Administered 2015-12-09: 12:00:00 2 [IU] via SUBCUTANEOUS
  Administered 2015-12-09 – 2015-12-10 (×2): 1 [IU] via SUBCUTANEOUS
  Administered 2015-12-10 – 2015-12-11 (×3): 2 [IU] via SUBCUTANEOUS
  Filled 2015-12-08: qty 1
  Filled 2015-12-08 (×2): qty 2
  Filled 2015-12-08: qty 1
  Filled 2015-12-08 (×4): qty 2

## 2015-12-08 MED ORDER — ONDANSETRON HCL 4 MG PO TABS: 4.0000 mg | ORAL_TABLET | Freq: Four times a day (QID) | ORAL | Status: DC | PRN

## 2015-12-08 MED ORDER — METOPROLOL SUCCINATE ER 25 MG PO TB24
25.0000 mg | ORAL_TABLET | Freq: Every day | ORAL | Status: DC
  Administered 2015-12-09 – 2015-12-11 (×3): 25 mg via ORAL
  Filled 2015-12-08 (×3): qty 1

## 2015-12-08 MED ORDER — ASPIRIN 81 MG PO CHEW
81.0000 mg | CHEWABLE_TABLET | Freq: Every day | ORAL | Status: DC
  Administered 2015-12-09 – 2015-12-11 (×3): 81 mg via ORAL
  Filled 2015-12-08 (×3): qty 1

## 2015-12-08 MED ORDER — ONDANSETRON HCL 4 MG/2ML IJ SOLN: 4.0000 mg | Freq: Four times a day (QID) | INTRAMUSCULAR | Status: DC | PRN

## 2015-12-08 MED ORDER — TRAZODONE HCL 50 MG PO TABS: 25.0000 mg | ORAL_TABLET | Freq: Every evening | ORAL | Status: DC | PRN

## 2015-12-08 MED ORDER — SODIUM CHLORIDE 0.9 % IV BOLUS (SEPSIS)
500.0000 mL | Freq: Once | INTRAVENOUS | Status: AC
  Administered 2015-12-08: 500 mL via INTRAVENOUS

## 2015-12-08 MED ORDER — SODIUM CHLORIDE 0.9 % IV SOLN
INTRAVENOUS | Status: AC
  Administered 2015-12-09: via INTRAVENOUS

## 2015-12-08 MED ORDER — SODIUM CHLORIDE 0.9 % IV SOLN: INTRAVENOUS | Status: DC

## 2015-12-08 MED ORDER — ALBUTEROL SULFATE (2.5 MG/3ML) 0.083% IN NEBU: 3.0000 mL | INHALATION_SOLUTION | Freq: Four times a day (QID) | RESPIRATORY_TRACT | Status: DC | PRN

## 2015-12-08 MED ORDER — LEVOTHYROXINE SODIUM 50 MCG PO TABS
50.0000 ug | ORAL_TABLET | Freq: Every day | ORAL | Status: DC
  Administered 2015-12-09 – 2015-12-11 (×3): 50 ug via ORAL
  Filled 2015-12-08 (×3): qty 1

## 2015-12-08 MED ORDER — WARFARIN SODIUM 4 MG PO TABS
4.0000 mg | ORAL_TABLET | Freq: Every day | ORAL | Status: DC
  Administered 2015-12-09 – 2015-12-10 (×2): 4 mg via ORAL
  Filled 2015-12-08 (×2): qty 1

## 2015-12-08 MED ORDER — SIMVASTATIN 20 MG PO TABS
20.0000 mg | ORAL_TABLET | Freq: Every day | ORAL | Status: DC
  Administered 2015-12-08 – 2015-12-10 (×3): 20 mg via ORAL
  Filled 2015-12-08 (×3): qty 1

## 2015-12-08 MED ORDER — INFLUENZA VAC SPLIT QUAD 0.5 ML IM SUSY
0.5000 mL | PREFILLED_SYRINGE | INTRAMUSCULAR | Status: AC
  Administered 2015-12-09: 11:00:00 0.5 mL via INTRAMUSCULAR
  Filled 2015-12-08: qty 0.5

## 2015-12-08 MED ORDER — ALLOPURINOL 300 MG PO TABS
300.0000 mg | ORAL_TABLET | Freq: Every day | ORAL | Status: DC
  Administered 2015-12-08 – 2015-12-11 (×4): 300 mg via ORAL
  Filled 2015-12-08 (×4): qty 1

## 2015-12-08 MED ORDER — ACETAMINOPHEN 325 MG PO TABS: 650.0000 mg | ORAL_TABLET | Freq: Four times a day (QID) | ORAL | Status: DC | PRN

## 2015-12-08 MED ORDER — PREGABALIN 50 MG PO CAPS: 50.0000 mg | ORAL_CAPSULE | Freq: Every day | ORAL | Status: DC

## 2015-12-08 NOTE — ED Provider Notes (Signed)
[facility] Emergency Department Provider Note    First MD Initiated Contact with Patient 12/08/15 1740     (approximate)  I have reviewed the triage vital signs and the nursing notes.   HISTORY  Chief Complaint Cough    HPI Lucas Caldwell is a 82 y.o. male with chief complaint of one month of generalized weakness. Patient very poor historian and unable to localize his complaints or recent for coming to the ER today. Patient states that he's been seen at [place] 3 times previously and not been able to find out why he feels so weak. He states that he has had a cough for 1 month and has not felt back to normal since having surgery due to infection to his right great toe. She is not on any antibiotics currently does have a history of congestive heart failure as well as diabetes and chronic myeloid leukemia.   Past Medical History:  Diagnosis Date  . Breast asymmetry    Left breast is larger, present for several years.  . Cancer ([place])    Chronic myeloid leukemia  . CHF (congestive heart failure) ([place])   . Diabetes mellitus without complication ([place])   . Gout   . Hyperlipemia   . Hypertension   . Thyroid disease     Patient Active Problem List   Diagnosis Date Noted  . Acute renal failure (ARF) ([place]) 12/08/2015    Past Surgical History:  Procedure Laterality Date  . CARDIAC CATHETERIZATION     2 stents  . HERNIA REPAIR      Prior to Admission medications   Medication Sig Start Date End Date Taking? Authorizing Provider  albuterol (PROVENTIL HFA;VENTOLIN HFA) 108 (90 BASE) MCG/ACT inhaler Inhale 2 puffs into the lungs every 6 (six) hours as needed for wheezing or shortness of breath. 11/04/14  Yes Ahmed Prima, MD  allopurinol (ZYLOPRIM) 300 MG tablet Take 300 mg by mouth every evening. 11/10/15  Yes Historical Provider, MD  aspirin EC 81 MG tablet Take 81 mg by mouth every morning. 03/05/12  Yes Historical Provider, MD  atropine 1 %  ophthalmic solution Place 1 drop into the right eye at bedtime.   Yes Historical Provider, MD  digoxin (LANOXIN) 0.125 MG tablet Take 125 mcg by mouth at bedtime. 11/10/15  Yes Historical Provider, MD  enalapril (VASOTEC) 5 MG tablet Take 5 mg by mouth 2 (two) times daily. 11/22/15  Yes Historical Provider, MD  eplerenone (INSPRA) 25 MG tablet Take 25 mg by mouth every evening. 11/10/15  Yes Historical Provider, MD  furosemide (LASIX) 40 MG tablet Take 1 tablet by mouth See admin instructions. Take 40 mg by mouth every morning and take 40 mg by mouth at lunchtime as needed for swelling or fluid retention. 11/10/15  Yes Historical Provider, MD  LANTUS SOLOSTAR 100 UNIT/ML Solostar Pen Inject 50 Units into the skin at bedtime. 11/22/15  Yes Historical Provider, MD  levothyroxine (SYNTHROID, LEVOTHROID) 50 MCG tablet Take 50 mcg by mouth daily before breakfast. 10/27/15  Yes Historical Provider, MD  metoprolol succinate (TOPROL-XL) 50 MG 24 hr tablet Take 50 mg by mouth every morning. 12/04/15  Yes Historical Provider, MD  mirtazapine (REMERON) 15 MG tablet Take 15 mg by mouth at bedtime. 11/02/15  Yes Historical Provider, MD  Multiple Vitamins-Minerals (MULTIVITAMIN ADULTS 50+ PO) Take 1 tablet by mouth every morning.   Yes Historical Provider, MD  omeprazole (PRILOSEC) 40 MG capsule Take 40 mg by mouth every morning.  11/10/15  Yes Historical Provider, MD  oxyCODONE (OXY IR/ROXICODONE) 5 MG immediate release tablet Take 5 mg by mouth every 6 (six) hours as needed for pain. 10/25/15  Yes Historical Provider, MD  simvastatin (ZOCOR) 20 MG tablet Take 20 mg by mouth every evening. 08/30/11  Yes Historical Provider, MD  Spacer/Aero-Holding Chambers [facility] ADVANTAGE) [place] 1 each by Other route once. 11/04/14  Yes Ahmed Prima, MD  SPIRIVA HANDIHALER 18 MCG inhalation capsule Place 18 mcg into inhaler and inhale every morning. 12/04/15  Yes Historical Provider, MD  warfarin (COUMADIN) 2 MG tablet Take 4 mg by mouth  at bedtime. 12/04/15  Yes Historical Provider, MD    Allergies Clopidogrel; Ciprofloxacin; Lyrica [pregabalin]; Spironolactone; and Plavix [clopidogrel bisulfate]  No family history on file.  Social History Social History  Substance Use Topics  . Smoking status: Former Research scientist (life sciences)  . Smokeless tobacco: Not on file  . Alcohol use No    Review of Systems Patient denies headaches, rhinorrhea, blurry vision, numbness, shortness of breath, chest pain, edema, cough, abdominal pain, nausea, vomiting, diarrhea, dysuria, fevers, rashes or hallucinations unless otherwise stated above in HPI. ____________________________________________   PHYSICAL EXAM:  VITAL SIGNS: Vitals:   12/08/15 2115 12/08/15 2130  BP: 119/70 126/70  Pulse: 67 71  Resp: 13 18  Temp:      Constitutional: Alert and oriented. Well appearing and in no acute distress. Eyes: Conjunctivae are normal. PERRL. EOMI. Head: Atraumatic. Nose: No congestion/rhinnorhea. Mouth/Throat: Mucous membranes are moist.  Oropharynx non-erythematous. Neck: No stridor. Painless ROM. No cervical spine tenderness to palpation Hematological/Lymphatic/Immunilogical: No cervical lymphadenopathy. Cardiovascular: Normal rate, regular rhythm. Grossly normal heart sounds.  Good peripheral circulation. Respiratory: Normal respiratory effort.  No retractions. Lungs CTAB. Gastrointestinal: Soft and nontender. No distention. No abdominal bruits. No CVA tenderness. Genitourinary:  Musculoskeletal: No lower extremity tenderness nor edema.  No joint effusions. Neurologic:  Normal speech and language. No gross focal neurologic deficits are appreciated. No gait instability. Skin:  Skin is warm, dry and intact. No rash noted. Psychiatric: Mood and affect are normal. Speech and behavior are normal.  ____________________________________________   LABS (all labs ordered are listed, but only abnormal results are displayed)  Results for orders placed or  performed during the hospital encounter of 12/08/15 (from the past 24 hour(s))  Basic metabolic panel     Status: Abnormal   Collection Time: 12/08/15  5:30 PM  Result Value Ref Range   Sodium 131 (L) 135 - 145 mmol/L   Potassium 4.4 3.5 - 5.1 mmol/L   Chloride 102 101 - 111 mmol/L   CO2 21 (L) 22 - 32 mmol/L   Glucose, Bld 214 (H) 65 - 99 mg/dL   BUN 34 (H) 6 - 20 mg/dL   Creatinine, Ser 2.21 (H) 0.61 - 1.24 mg/dL   Calcium 9.1 8.9 - 10.3 mg/dL   GFR calc non Af Amer 26 (L) >60 mL/min   GFR calc Af Amer 31 (L) >60 mL/min   Anion gap 8 5 - 15  CBC     Status: Abnormal   Collection Time: 12/08/15  5:30 PM  Result Value Ref Range   WBC 9.6 3.8 - 10.6 K/uL   RBC 3.13 (L) 4.40 - 5.90 MIL/uL   Hemoglobin 9.9 (L) 13.0 - 18.0 g/dL   HCT 29.2 (L) 40.0 - 52.0 %   MCV 93.3 80.0 - 100.0 fL   MCH 31.6 26.0 - 34.0 pg   MCHC 33.9 32.0 - 36.0 g/dL  RDW 14.5 11.5 - 14.5 %   Platelets 263 150 - 440 K/uL  Troponin I     Status: Abnormal   Collection Time: 12/08/15  5:30 PM  Result Value Ref Range   Troponin I 0.03 (HH) <0.03 ng/mL  Urinalysis complete, with microscopic     Status: Abnormal   Collection Time: 12/08/15  7:50 PM  Result Value Ref Range   Color, Urine STRAW (A) YELLOW   APPearance CLEAR (A) CLEAR   Glucose, UA NEGATIVE NEGATIVE mg/dL   Bilirubin Urine NEGATIVE NEGATIVE   Ketones, ur NEGATIVE NEGATIVE mg/dL   Specific Gravity, Urine 1.006 1.005 - 1.030   Hgb urine dipstick NEGATIVE NEGATIVE   pH 5.0 5.0 - 8.0   Protein, ur NEGATIVE NEGATIVE mg/dL   Nitrite NEGATIVE NEGATIVE   Leukocytes, UA NEGATIVE NEGATIVE   RBC / HPF NONE SEEN 0 - 5 RBC/hpf   WBC, UA 0-5 0 - 5 WBC/hpf   Bacteria, UA RARE (A) NONE SEEN   Squamous Epithelial / LPF NONE SEEN NONE SEEN   Hyaline Casts, UA PRESENT    ____________________________________________  EKG My review and personal interpretation at Time: 17:29   Indication: cough/ weakness  Rate: 80  Rhythm: nsr Axis: left Other: rbbb, lafv,  no acute ischemic changes ____________________________________________  RADIOLOGY  CXR my read shows no evidence of acute cardiopulmonary process.  ____________________________________________   PROCEDURES  Procedure(s) performed: none    Critical Care performed: no ____________________________________________   INITIAL IMPRESSION / ASSESSMENT AND PLAN / ED COURSE  Pertinent labs & imaging results that were available during my care of the patient were reviewed by me and considered in my medical decision making (see chart for details).  DDX: Pneumonia, dehydration, likely at abnormality, UTI  SHADE KALEY is a 82 y.o. who presents to the ED with 1 month of generalized fatigue and decreased oral intake. On exam he is in no acute distress. Afebrile and hemodynamic stable. His abdominal exam is soft and benign. No evidence of acute ischemia. Based on his history I am concerned this may be more of a worsening dehydration and failure to thrive as this has been ongoing for over a month. Has been seen at his outpatient facility where they were concerned for similar.  The patient will be placed on continuous pulse oximetry and telemetry for monitoring.  Laboratory evaluation will be sent to evaluate for the above complaints.     Clinical Course  Comment By Time  Patient reassessed. Does have evidence of acute kidney injury with metabolic acidosis. Currently awaiting urinalysis. Denies any chest pain.  I will speak with hospitalist regarding admission for gentle IV hydration due to the patient's AK I and generalized weakness.  Merlyn Lot, MD 09/22 1936     ____________________________________________   FINAL CLINICAL IMPRESSION(S) / ED DIAGNOSES  Final diagnoses:  Acute kidney injury ([place])  Weakness      NEW MEDICATIONS STARTED DURING THIS VISIT:  New Prescriptions   No medications on file     Note:  This document was prepared using Dragon voice recognition  software and may include unintentional dictation errors.    Merlyn Lot, MD 12/08/15 2154

## 2015-12-08 NOTE — ED Triage Notes (Signed)
Pt had surgery on right great toe 2 months ago, pt reports "not feeling well" since surgery, pt reports productive cough, aches, generalized weakness

## 2015-12-08 NOTE — ED Notes (Signed)
Attempted to call report on pt - nurse requested to return call for report

## 2015-12-08 NOTE — Progress Notes (Signed)
ANTICOAGULATION CONSULT NOTE - Initial Consult  Pharmacy Consult for warfarin dosing Indication: atrial fibrillation  Allergies  Allergen Reactions  . Clopidogrel Anaphylaxis    Other reaction(s): ANAPHYLAXIS;altered mental status;  (electrolytes "out of wack" and confusion per family; THEY DO NOT REMEMBER ANY OTHER REACTION)- MSB 10/10/15  . Ciprofloxacin Itching  . Lyrica [Pregabalin] Itching  . Spironolactone     Other reaction(s): Other (See Comments) gynecomastia  . Plavix [Clopidogrel Bisulfate] Anxiety    Patient Measurements: Height: 5\' 7"  (170.2 cm) Weight: 185 lb (83.9 kg) IBW/kg (Calculated) : 66.1 Heparin Dosing Weight: n/a  Vital Signs: Temp: 97.8 F (36.6 C) (09/22 2204) Temp Source: Oral (09/22 2204) BP: 147/48 (09/22 2204) Pulse Rate: 59 (09/22 2204)  Labs:  Recent Labs  12/08/15 1730  HGB 9.9*  HCT 29.2*  PLT 263  LABPROT 22.6*  INR 1.96  CREATININE 2.21*  TROPONINI 0.03*    Estimated Creatinine Clearance: 27.6 mL/min (by C-G formula based on SCr of 2.21 mg/dL (H)).   Medical History: Past Medical History:  Diagnosis Date  . Breast asymmetry    Left breast is larger, present for several years.  . Cancer ([place])    Chronic myeloid leukemia  . CHF (congestive heart failure) ([place])   . Diabetes mellitus without complication ([place])   . Gout   . Hyperlipemia   . Hypertension   . Thyroid disease     Medications:    Assessment: INR 1.96 on admission  Goal of Therapy:  INR 2-3    Plan:  Resume home regimen of 4 mg daily. Recheck INR 9/24 AM.  Paislie Tessler S 12/08/2015,10:31 PM

## 2015-12-08 NOTE — H&P (Signed)
[place] at [place] NAME: Lucas Caldwell    MR#:  [id number]  DATE OF BIRTH:  Jul 28, 1933  DATE OF ADMISSION:  12/08/2015  PRIMARY CARE PHYSICIAN: Jeanella Anton, MD   REQUESTING/REFERRING PHYSICIAN: Dr. Merlyn Lot  CHIEF COMPLAINT: Fatigue, poor by mouth intake    Chief Complaint  Patient presents with  . Cough    HISTORY OF PRESENT ILLNESS:  Lucas Caldwell  is a 82 y.o. male with a known history of CML, hypertension, diabetes mellitus type 2, atrial fibrillation, M in because of generalized weakness, fatigue, poor by mouth intake for 2 months. Patient also having some cough but no phlegm. Lab work showed patient has acute renal failure so we have asked her to admit the patient. According to the patient and patient's wife patient has not been eating for the last 2 months since the surgery for his right foot for diabetic infection. Patient continued to take all his medications. But not eating well and he also lost about 20 pounds in the last 2 months. Patient has been the primary doctor for the same, started on Remeron. Patient has been taking  gleevac for CML. All his Drs are  at [facility]. She had no chest pain, no nausea, no vomiting, no diarrhea, no abdominal pain, no headache, no fever.  PAST MEDICAL HISTORY:   Past Medical History:  Diagnosis Date  . Breast asymmetry    Left breast is larger, present for several years.  . Cancer ([place])    Chronic myeloid leukemia  . CHF (congestive heart failure) ([place])   . Diabetes mellitus without complication ([place])   . Gout   . Hyperlipemia   . Hypertension   . Thyroid disease     PAST SURGICAL HISTOIRY:   Past Surgical History:  Procedure Laterality Date  . CARDIAC CATHETERIZATION     2 stents  . HERNIA REPAIR      SOCIAL HISTORY:   Social History  Substance Use Topics  . Smoking status: Former Research scientist (life sciences)  . Smokeless tobacco: Not on file  . Alcohol use No     FAMILY HISTORY:  No family history on file.  DRUG ALLERGIES:   Allergies  Allergen Reactions  . Plavix [Clopidogrel Bisulfate] Anxiety    REVIEW OF SYSTEMS:  CONSTITUTIONAL: No fever, fatigue or weakness.  EYES: No blurred or double vision.  EARS, NOSE, AND THROAT: No tinnitus or ear pain.  RESPIRATORY: No cough, shortness of breath, wheezing or hemoptysis.  CARDIOVASCULAR: No chest pain, orthopnea, edema.  GASTROINTESTINAL: No nausea, vomiting, diarrhea or abdominal pain.  GENITOURINARY: No dysuria, hematuria.  ENDOCRINE: No polyuria, nocturia,  HEMATOLOGY: No anemia, easy bruising or bleeding SKIN: No rash or lesion. MUSCULOSKELETAL: No joint pain or arthritis.   NEUROLOGIC: No tingling, numbness, weakness.  PSYCHIATRY: No anxiety or depression.   MEDICATIONS AT HOME:   Prior to Admission medications   Medication Sig Start Date End Date Taking? Authorizing Provider  albuterol (PROVENTIL HFA;VENTOLIN HFA) 108 (90 BASE) MCG/ACT inhaler Inhale 2 puffs into the lungs every 6 (six) hours as needed for wheezing or shortness of breath. 11/04/14   Ahmed Prima, MD  Spacer/Aero-Holding Chambers [facility] ADVANTAGE) [place] 1 each by Other route once. 11/04/14   Ahmed Prima, MD      VITAL SIGNS:  Blood pressure (!) 107/45, pulse 79, temperature 98.3 F (36.8 C), temperature source Oral, height 5\' 7"  (1.702 m), weight 83.9 kg (185 lb), SpO2 100 %.  PHYSICAL EXAMINATION:  GENERAL:  82 y.o.-year-old patient lying in the bed with no acute distress.  EYES: Pupils equal, round, reactive to light and accommodation. No scleral icterus. Extraocular muscles intact.  HEENT: Head atraumatic, normocephalic. Oropharynx and nasopharynx clear.  NECK:  Supple, no jugular venous distention. No thyroid enlargement, no tenderness.  LUNGS: Normal breath sounds bilaterally, no wheezing, rales,rhonchi or crepitation. No use of accessory muscles of respiration.  CARDIOVASCULAR: S1, S2  normal. No murmurs, rubs, or gallops.  ABDOMEN: Soft, nontender, nondistended. Bowel sounds present. No organomegaly or mass.  EXTREMITIES: No pedal edema, cyanosis, or clubbing.  NEUROLOGIC: Cranial nerves II through XII are intact. Muscle strength 5/5 in all extremities. Sensation intact. Gait not checked.  PSYCHIATRIC: The patient is alert and oriented x 3.  SKIN: No obvious rash, lesion, or ulcer.   LABORATORY PANEL:   CBC  Recent Labs Lab 12/08/15 1730  WBC 9.6  HGB 9.9*  HCT 29.2*  PLT 263   ------------------------------------------------------------------------------------------------------------------  Chemistries   Recent Labs Lab 12/08/15 1730  NA 131*  K 4.4  CL 102  CO2 21*  GLUCOSE 214*  BUN 34*  CREATININE 2.21*  CALCIUM 9.1   ------------------------------------------------------------------------------------------------------------------  Cardiac Enzymes  Recent Labs Lab 12/08/15 1730  TROPONINI 0.03*   ------------------------------------------------------------------------------------------------------------------  RADIOLOGY:  Dg Chest 2 View  Result Date: 12/08/2015 CLINICAL DATA:  Cough and generalized weakness.  Hypertension EXAM: CHEST  2 VIEW COMPARISON:  November 04, 2014 FINDINGS: Lungs are clear. Heart size and pulmonary vascularity are normal. No adenopathy. There is atherosclerotic calcification in the aortic arch region. No bone lesions. IMPRESSION: Aortic atherosclerosis.  No edema or consolidation. Electronically Signed   By: Lowella Grip III M.D.   On: 12/08/2015 18:35    EKG:   Orders placed or performed during the hospital encounter of 12/08/15  . ED EKG  . ED EKG  . EKG 12-Lead  . EKG 12-Lead   IMPRESSION AND PLAN:  82 year old male patient with multiple medical problems of essential hypertension, gout, diabetes mellitus type 2, CML comes in because of poor by mouth intake, fatigue or, generalized weakness, found to  have acute renal failure. #1 acute renal failure likely ATN due to poor poor by mouth intake. Admitted to hospitalist service, start on IV hydration, hold nephrotoxic agents, hold Lasix, lisinopril, digoxin. Monitor kidney function closely. #2 poor appetite, fatigue likely secondary to arm CML and we will ask oncology to see the patient to see if for Balmorhea can be continued. #3 possible depression, started on now Remeron by primary doctor. Ration says that he is not taking it. #4 history of diabetes mellitus type 2, ;patient has poor by mouth intake. Hold the Lantus., Continue SSA with coverage. ;#5 chronic gout. Continue allopurinol. #6. chronic atrial fibrillation: Patient is on metoprolol, digoxin, Coumadin. Patient digoxin is held because of renal failure. Continue metoprolol, Coumadin  as per pharmacy dosing. 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 Patient seen by podiatry. She has a healed area on the right foot great toe. #8. chronic systolic heart failure: Patient is euvolemic. Hold the Lasix., Lisinopril because of renal failure. Reviewed the records in [place].  All the records are reviewed and case discussed with ED provider. Management plans discussed with the patient, family and they are in agreement.  CODE STATUS: Full code.  TOTAL TIME TAKING CARE OF THIS PATIENT: 95minutes.  Epifanio Lesches M.D on 12/08/2015 at 8:29 PM  Between 7am to 6pm - Pager - [phone]  After 6pm go to [email or web address] - password [email or web address] [facility]  Office  [phone]  CC: Primary care physician; Jeanella Anton, MD  Note: This dictation was prepared with Dragon dictation along with smaller phrase technology. Any transcriptional errors that result from this process are unintentional.

## 2015-12-08 NOTE — ED Notes (Signed)
Spoke to pharmacy about obtaining medications (allopurinol, asa, metoprolol xl, lyrica) - per pharmacy they will send these medications to the floor once he is admitted so not to give them while in the er

## 2015-12-09 DIAGNOSIS — I509 Heart failure, unspecified: Secondary | ICD-10-CM

## 2015-12-09 DIAGNOSIS — I10 Essential (primary) hypertension: Secondary | ICD-10-CM

## 2015-12-09 DIAGNOSIS — N289 Disorder of kidney and ureter, unspecified: Secondary | ICD-10-CM

## 2015-12-09 DIAGNOSIS — C9210 Chronic myeloid leukemia, BCR/ABL-positive, not having achieved remission: Secondary | ICD-10-CM

## 2015-12-09 DIAGNOSIS — R630 Anorexia: Secondary | ICD-10-CM

## 2015-12-09 DIAGNOSIS — E860 Dehydration: Secondary | ICD-10-CM

## 2015-12-09 DIAGNOSIS — E119 Type 2 diabetes mellitus without complications: Secondary | ICD-10-CM

## 2015-12-09 DIAGNOSIS — Z79899 Other long term (current) drug therapy: Secondary | ICD-10-CM

## 2015-12-09 LAB — CBC
HEMATOCRIT: 27.2 % — AB (ref 40.0–52.0)
Hemoglobin: 9.3 g/dL — ABNORMAL LOW (ref 13.0–18.0)
MCH: 31.4 pg (ref 26.0–34.0)
MCHC: 34 g/dL (ref 32.0–36.0)
MCV: 92.4 fL (ref 80.0–100.0)
Platelets: 211 10*3/uL (ref 150–440)
RBC: 2.95 MIL/uL — AB (ref 4.40–5.90)
RDW: 14.4 % (ref 11.5–14.5)
WBC: 7.5 10*3/uL (ref 3.8–10.6)

## 2015-12-09 LAB — BASIC METABOLIC PANEL
ANION GAP: 6 (ref 5–15)
BUN: 27 mg/dL — ABNORMAL HIGH (ref 6–20)
CHLORIDE: 108 mmol/L (ref 101–111)
CO2: 22 mmol/L (ref 22–32)
Calcium: 8.6 mg/dL — ABNORMAL LOW (ref 8.9–10.3)
Creatinine, Ser: 1.65 mg/dL — ABNORMAL HIGH (ref 0.61–1.24)
GFR, EST AFRICAN AMERICAN: 44 mL/min — AB (ref >60)
GFR, EST NON AFRICAN AMERICAN: 38 mL/min — AB (ref >60)
Glucose, Bld: 106 mg/dL — ABNORMAL HIGH (ref 65–99)
POTASSIUM: 4.2 mmol/L (ref 3.5–5.1)
SODIUM: 136 mmol/L (ref 135–145)

## 2015-12-09 LAB — GLUCOSE, CAPILLARY
GLUCOSE-CAPILLARY: 122 mg/dL — AB (ref 65–99)
GLUCOSE-CAPILLARY: 154 mg/dL — AB (ref 65–99)
Glucose-Capillary: 143 mg/dL — ABNORMAL HIGH (ref 65–99)
Glucose-Capillary: 151 mg/dL — ABNORMAL HIGH (ref 65–99)

## 2015-12-09 MED ORDER — WARFARIN - PHARMACIST DOSING INPATIENT
Freq: Every day | Status: DC
  Administered 2015-12-09: 18:00:00

## 2015-12-09 NOTE — Progress Notes (Signed)
ANTICOAGULATION CONSULT NOTE -follow up [facility] for warfarin dosing Indication: atrial fibrillation  Allergies  Allergen Reactions  . Clopidogrel Anaphylaxis    Other reaction(s): ANAPHYLAXIS;altered mental status;  (electrolytes "out of wack" and confusion per family; THEY DO NOT REMEMBER ANY OTHER REACTION)- MSB 10/10/15  . Ciprofloxacin Itching  . Lyrica [Pregabalin] Itching  . Spironolactone     Other reaction(s): Other (See Comments) gynecomastia  . Plavix [Clopidogrel Bisulfate] Anxiety    Patient Measurements: Height: 5\' 6"  (167.6 cm) Weight: 181 lb 9.6 oz (82.4 kg) IBW/kg (Calculated) : 63.8 Heparin Dosing Weight: n/a  Vital Signs: Temp: 98.1 F (36.7 C) (09/23 0500) Temp Source: Oral (09/23 0500) BP: 132/63 (09/23 0808) Pulse Rate: 80 (09/23 0808)  Labs:  Recent Labs  12/08/15 1730 12/09/15 0448  HGB 9.9* 9.3*  HCT 29.2* 27.2*  PLT 263 211  LABPROT 22.6*  --   INR 1.96  --   CREATININE 2.21* 1.65*  TROPONINI 0.03*  --     Estimated Creatinine Clearance: 36 mL/min (by C-G formula based on SCr of 1.65 mg/dL (H)).   Medical History: Past Medical History:  Diagnosis Date  . Breast asymmetry    Left breast is larger, present for several years.  . Cancer ([place])    Chronic myeloid leukemia  . CHF (congestive heart failure) ([place])   . Diabetes mellitus without complication ([place])   . Gout   . Hyperlipemia   . Hypertension   . Thyroid disease      Assessment: INR 1.96 on admission  9/22  INR 1.96 (1730)  No dose charted 9/23 no INR   Goal of Therapy:  INR 2-3    Plan:  Resume home regimen of 4 mg daily. Recheck INR 9/24 AM.  Romaine Neville A 12/09/2015,12:58 PM

## 2015-12-09 NOTE — Progress Notes (Signed)
[place] [facility] [place] NAME: Lucas Caldwell    MR#:  [id number]  DATE OF BIRTH:  [birth year]  SUBJECTIVE: Admitted for acute renal failure, poor appetite. Today morning he feels better, ate good breakfast. Kidney function improving with IV hydration. Denies any other complaints.   CHIEF COMPLAINT:   Chief Complaint  Patient presents with  . Cough    REVIEW OF SYSTEMS:   ROS CONSTITUTIONAL: No fever, fatigue or weakness.  EYES: No blurred or double vision.  EARS, NOSE, AND THROAT: No tinnitus or ear pain.  RESPIRATORY: No cough, shortness of breath, wheezing or hemoptysis.  CARDIOVASCULAR: No chest pain, orthopnea, edema.  GASTROINTESTINAL: No nausea, vomiting, diarrhea or abdominal pain.  GENITOURINARY: No dysuria, hematuria.  ENDOCRINE: No polyuria, nocturia,  HEMATOLOGY: No anemia, easy bruising or bleeding SKIN: No rash or lesion. MUSCULOSKELETAL: No joint pain or arthritis.   NEUROLOGIC: No tingling, numbness, weakness.  PSYCHIATRY: No anxiety or depression.   DRUG ALLERGIES:   Allergies  Allergen Reactions  . Clopidogrel Anaphylaxis    Other reaction(s): ANAPHYLAXIS;altered mental status;  (electrolytes "out of wack" and confusion per family; THEY DO NOT REMEMBER ANY OTHER REACTION)- MSB 10/10/15  . Ciprofloxacin Itching  . Lyrica [Pregabalin] Itching  . Spironolactone     Other reaction(s): Other (See Comments) gynecomastia  . Plavix [Clopidogrel Bisulfate] Anxiety    VITALS:  Blood pressure 132/63, pulse 80, temperature 98.1 F (36.7 C), temperature source Oral, resp. rate 18, height 5\' 6"  (1.676 m), weight 82.4 kg (181 lb 9.6 oz), SpO2 99 %.  PHYSICAL EXAMINATION:  GENERAL:  82 y.o.-year-old patient lying in the bed with no acute distress.  EYES: Pupils equal, round, reactive to light and accommodation. No scleral icterus. Extraocular muscles intact.  HEENT: Head atraumatic, normocephalic. Oropharynx and  nasopharynx clear.  NECK:  Supple, no jugular venous distention. No thyroid enlargement, no tenderness.  LUNGS: Normal breath sounds bilaterally, no wheezing, rales,rhonchi or crepitation. No use of accessory muscles of respiration.  CARDIOVASCULAR: S1, S2 normal. No murmurs, rubs, or gallops.  ABDOMEN: Soft, nontender, nondistended. Bowel sounds present. No organomegaly or mass.  EXTREMITIES: No pedal edema, cyanosis, or clubbing.  NEUROLOGIC: Cranial nerves II through XII are intact. Muscle strength 5/5 in all extremities. Sensation intact. Gait not checked.  PSYCHIATRIC: The patient is alert and oriented x 3.  SKIN: No obvious rash, lesion, or ulcer.    LABORATORY PANEL:   CBC  Recent Labs Lab 12/09/15 0448  WBC 7.5  HGB 9.3*  HCT 27.2*  PLT 211   ------------------------------------------------------------------------------------------------------------------  Chemistries   Recent Labs Lab 12/09/15 0448  NA 136  K 4.2  CL 108  CO2 22  GLUCOSE 106*  BUN 27*  CREATININE 1.65*  CALCIUM 8.6*   ------------------------------------------------------------------------------------------------------------------  Cardiac Enzymes  Recent Labs Lab 12/08/15 1730  TROPONINI 0.03*   ------------------------------------------------------------------------------------------------------------------  RADIOLOGY:  Dg Chest 2 View  Result Date: 12/08/2015 CLINICAL DATA:  Cough and generalized weakness.  Hypertension EXAM: CHEST  2 VIEW COMPARISON:  November 04, 2014 FINDINGS: Lungs are clear. Heart size and pulmonary vascularity are normal. No adenopathy. There is atherosclerotic calcification in the aortic arch region. No bone lesions. IMPRESSION: Aortic atherosclerosis.  No edema or consolidation. Electronically Signed   By: Lowella Grip III M.D.   On: 12/08/2015 18:35    EKG:   Orders placed or performed during the hospital encounter of 12/08/15  . ED EKG  . ED EKG  .  EKG 12-Lead  . EKG 12-Lead    ASSESSMENT AND PLAN:   #1 acute renal failure: Prerenal secondary to poor by mouth intake for last few months. Continue IV hydration, kidney function improving. Avoid nephrotoxic agents. Continue to hold the Lasix, lisinopril today also. The same discussed with the patient, registered nurse. #2 history of CML: Patient takes gleevac but it is on on hold. Appreciate oncology consult. Patient to complaints of decreased appetite and also a lot of fatigue. Concern about to possible polypharmacy. #3 history of diabetes mellitus type 2: Continue sulfate coverage, hold the Lantus.until po intake and BG better. #4 essential hypertension: Controlled. #5. chronic atrial fibrillation rate controlled with beta blockers. Continue Coumadin. Patient is a 55% by the echo done in April of this year at [facility]. Patient has diastolic dysfunction.  #6 .weakness: Deconditioning: Physical therapy consult today.  Discussed with patient's registered nurse. Appreciate the nurse in the room while rounding.   All the records are reviewed and case discussed with Care Management/Social Workerr. Management plans discussed with the patient, family and they are in agreement.  CODE STATUS: full  TOTAL TIME TAKING CARE OF THIS PATIENT: 26minutes.   POSSIBLE D/C IN 1-2 DAYS, DEPENDING ON CLINICAL CONDITION.   Epifanio Lesches M.D on 12/09/2015 at 9:32 AM  Between 7am to 6pm - Pager - [phone]  After 6pm go to [email or web address] - password [email or web address] [facility]  Office  [phone]  CC: Primary care physician; Jeanella Anton, MD   Note: This dictation was prepared with Dragon dictation along with smaller phrase technology. Any transcriptional errors that result from this process are unintentional.

## 2015-12-09 NOTE — Consult Note (Signed)
[facility]  Date of admission:  12/08/2015  Inpatient day:  12/09/2015  Consulting physician:  Dr. Epifanio Lesches  Reason for Consultation:  CML on [place] now with acute renal failure; poor appetite for 2 months.  Chief Complaint: Lucas Caldwell is a 82 y.o. male with CML who was admitted through the emergency room with dehydration and acute renal insufficiency.  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 He presented with a white count of 41,900. BCR-ABL was positive as was t(9;22).  He has been on imatinib that time. He has tolerated his treatment well.  He has been predominantly on a dose of 400 mg a day although in 04/2015 dose was reduced to 400 mg every other day due to an interaction with an increased dose eplerenone and evidence of dehydration.  Labs on 10/13/2015 revealed a hematocrit of 30.2, hemoglobin 10.1, MCV 96.7, platelets 246,000, WBC 9,400.  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   He has completed a course of antibiotics. He has done well.  He states that over the past 2 months since his infected toe, he has lost weight secondary to decreased appetite. He's had a cough and phlegm production for greater than a month. He's had a fever.    He presented to the emergency room at [facility].  Creatinine was 2.21 with a prior value of 1.35 on 04/19/2015. CrCl is 31 ml/min.  Chest x-ray was negative.  He was started on IV fluids.   Past Medical History:  Diagnosis Date  . Breast asymmetry    Left breast is larger, present for several years.  . Cancer ([place])    Chronic myeloid leukemia  . CHF (congestive heart failure) ([place])   . Diabetes mellitus without complication ([place])   . Gout   . Hyperlipemia   . Hypertension   . Thyroid disease     Past Surgical History:  Procedure Laterality Date  . CARDIAC CATHETERIZATION     2 stents  . HERNIA REPAIR      History reviewed. No pertinent family  history.  Social History:  reports that he has quit smoking. He has never used smokeless tobacco. He reports that he does not drink alcohol. His drug history is not on file.  He lives in [place] with his wife.  His doctors are at [facility].  The patient is accompanied by his wife, son, daughter, and 2 great grandchildren.  Allergies:  Allergies  Allergen Reactions  . Clopidogrel Anaphylaxis    Other reaction(s): ANAPHYLAXIS;altered mental status;  (electrolytes "out of wack" and confusion per family; THEY DO NOT REMEMBER ANY OTHER REACTION)- MSB 10/10/15  . Ciprofloxacin Itching  . Lyrica [Pregabalin] Itching  . Spironolactone     Other reaction(s): Other (See Comments) gynecomastia  . Plavix [Clopidogrel Bisulfate] Anxiety    Medications Prior to Admission  Medication Sig Dispense Refill  . albuterol (PROVENTIL HFA;VENTOLIN HFA) 108 (90 BASE) MCG/ACT inhaler Inhale 2 puffs into the lungs every 6 (six) hours as needed for wheezing or shortness of breath. 1 Inhaler 2  . allopurinol (ZYLOPRIM) 300 MG tablet Take 300 mg by mouth every evening.  12  . aspirin EC 81 MG tablet Take 81 mg by mouth every morning.    [name] atropine 1 % ophthalmic solution Place 1 drop into the right eye at bedtime.    . digoxin (LANOXIN) 0.125 MG tablet Take 125 mcg by mouth at bedtime.  12  . enalapril (  VASOTEC) 5 MG tablet Take 5 mg by mouth 2 (two) times daily.  11  . eplerenone (INSPRA) 25 MG tablet Take 25 mg by mouth every evening.  6  . furosemide (LASIX) 40 MG tablet Take 1 tablet by mouth See admin instructions. Take 40 mg by mouth every morning and take 40 mg by mouth at lunchtime as needed for swelling or fluid retention.  12  . LANTUS SOLOSTAR 100 UNIT/ML Solostar Pen Inject 50 Units into the skin at bedtime.  3  . levothyroxine (SYNTHROID, LEVOTHROID) 50 MCG tablet Take 50 mcg by mouth daily before breakfast.  12  . metoprolol succinate (TOPROL-XL) 50 MG 24 hr tablet Take 50 mg by mouth every morning.  12   . mirtazapine (REMERON) 15 MG tablet Take 15 mg by mouth at bedtime.  3  . Multiple Vitamins-Minerals (MULTIVITAMIN ADULTS 50+ PO) Take 1 tablet by mouth every morning.    [name] omeprazole (PRILOSEC) 40 MG capsule Take 40 mg by mouth every morning.  12  . oxyCODONE (OXY IR/ROXICODONE) 5 MG immediate release tablet Take 5 mg by mouth every 6 (six) hours as needed for pain.  0  . simvastatin (ZOCOR) 20 MG tablet Take 20 mg by mouth every evening.    [name] Spacer/Aero-Holding Chambers (OPTICHAMBER ADVANTAGE) MISC 1 each by Other route once. 1 each 0  . SPIRIVA HANDIHALER 18 MCG inhalation capsule Place 18 mcg into inhaler and inhale every morning.  11  . warfarin (COUMADIN) 2 MG tablet Take 4 mg by mouth at bedtime.  12    Review of Systems: GENERAL:  Fatigue.  Fevers.  No ssweats or weight loss. PERFORMANCE STATUS (ECOG):  2 HEENT:  No visual changes, runny nose, sore throat, mouth sores or tenderness. Lungs: Cough.  Phlegm.  No shortness of breath.  No hemoptysis. Cardiac:  No chest pain, palpitations, orthopnea, or PND. GI:  Poor appetite.  No nausea, vomiting, diarrhea, constipation, melena or hematochezia. GU:  No urgency, frequency, dysuria, or hematuria. Musculoskeletal:  No back pain.  No joint pain.  No muscle tenderness. Extremities:  No pain or swelling. Skin:  No rashes or skin changes. Neuro:  No headache, numbness or weakness, balance or coordination issues. Endocrine:  No diabetes.  Thyroid disease on Synthroid.  No hot flashes or night sweats. Psych:  No mood changes, depression or anxiety. Pain:  No focal pain. Review of systems:  All other systems reviewed and found to be negative.  Physical Exam:  Blood pressure 132/63, pulse 80, temperature 98.1 F (36.7 C), temperature source Oral, resp. rate 18, height '5\' 6"'  (1.676 m), weight 181 lb 9.6 oz (82.4 kg), SpO2 99 %.  GENERAL:  Well developed, well nourished, elderly gentleman sitting comfortably on the medical unit in no acute  distress. MENTAL STATUS:  Alert and oriented to person, place and time. HEAD:  Dark hair with graying.  Normocephalic, atraumatic, face symmetric, no Cushingoid features. EYES:  Brown eyes.  Pupils equal round and reactive to light and accomodation.  No conjunctivitis or scleral icterus. ENT:  Oropharynx clear without lesion.  Tongue normal. Mucous membranes moist.  RESPIRATORY:  Clear to auscultation without rales, wheezes or rhonchi. CARDIOVASCULAR:  Regular rate and rhythm without murmur, rub or gallop. ABDOMEN:  Soft, non-tender, with active bowel sounds, and no hepatosplenomegaly.  No masses. SKIN:  No rashes, ulcers or lesions. EXTREMITIES: Right great toe s/p partial amputation without erythema, induration or increased warmth.  No edema, no skin discoloration or tenderness.  No palpable cords. LYMPH NODES: No palpable cervical, supraclavicular, axillary or inguinal adenopathy  NEUROLOGICAL: Unremarkable. PSYCH:  Appropriate.   Results for orders placed or performed during the hospital encounter of 12/08/15 (from the past 48 hour(s))  Basic metabolic panel     Status: Abnormal   Collection Time: 12/08/15  5:30 PM  Result Value Ref Range   Sodium 131 (L) 135 - 145 mmol/L   Potassium 4.4 3.5 - 5.1 mmol/L   Chloride 102 101 - 111 mmol/L   CO2 21 (L) 22 - 32 mmol/L   Glucose, Bld 214 (H) 65 - 99 mg/dL   BUN 34 (H) 6 - 20 mg/dL   Creatinine, Ser 2.21 (H) 0.61 - 1.24 mg/dL   Calcium 9.1 8.9 - 10.3 mg/dL   GFR calc non Af Amer 26 (L) >60 mL/min   GFR calc Af Amer 31 (L) >60 mL/min    Comment: (NOTE) The eGFR has been calculated using the CKD EPI equation. This calculation has not been validated in all clinical situations. eGFR's persistently <60 mL/min signify possible Chronic Kidney Disease.    Anion gap 8 5 - 15  CBC     Status: Abnormal   Collection Time: 12/08/15  5:30 PM  Result Value Ref Range   WBC 9.6 3.8 - 10.6 K/uL   RBC 3.13 (L) 4.40 - 5.90 MIL/uL   Hemoglobin 9.9  (L) 13.0 - 18.0 g/dL   HCT 29.2 (L) 40.0 - 52.0 %   MCV 93.3 80.0 - 100.0 fL   MCH 31.6 26.0 - 34.0 pg   MCHC 33.9 32.0 - 36.0 g/dL   RDW 14.5 11.5 - 14.5 %   Platelets 263 150 - 440 K/uL  Troponin I     Status: Abnormal   Collection Time: 12/08/15  5:30 PM  Result Value Ref Range   Troponin I 0.03 (HH) <0.03 ng/mL    Comment: CRITICAL RESULT CALLED TO, READ BACK BY AND VERIFIED WITH TERESA HUDSON AT 2028 12/08/15.PMH  Protime-INR     Status: Abnormal   Collection Time: 12/08/15  5:30 PM  Result Value Ref Range   Prothrombin Time 22.6 (H) 11.4 - 15.2 seconds   INR 1.96   Urinalysis complete, with microscopic     Status: Abnormal   Collection Time: 12/08/15  7:50 PM  Result Value Ref Range   Color, Urine STRAW (A) YELLOW   APPearance CLEAR (A) CLEAR   Glucose, UA NEGATIVE NEGATIVE mg/dL   Bilirubin Urine NEGATIVE NEGATIVE   Ketones, ur NEGATIVE NEGATIVE mg/dL   Specific Gravity, Urine 1.006 1.005 - 1.030   Hgb urine dipstick NEGATIVE NEGATIVE   pH 5.0 5.0 - 8.0   Protein, ur NEGATIVE NEGATIVE mg/dL   Nitrite NEGATIVE NEGATIVE   Leukocytes, UA NEGATIVE NEGATIVE   RBC / HPF NONE SEEN 0 - 5 RBC/hpf   WBC, UA 0-5 0 - 5 WBC/hpf   Bacteria, UA RARE (A) NONE SEEN   Squamous Epithelial / LPF NONE SEEN NONE SEEN   Hyaline Casts, UA PRESENT   Basic metabolic panel     Status: Abnormal   Collection Time: 12/09/15  4:48 AM  Result Value Ref Range   Sodium 136 135 - 145 mmol/L   Potassium 4.2 3.5 - 5.1 mmol/L   Chloride 108 101 - 111 mmol/L   CO2 22 22 - 32 mmol/L   Glucose, Bld 106 (H) 65 - 99 mg/dL   BUN 27 (H) 6 - 20 mg/dL   Creatinine, Ser 1.65 (  H) 0.61 - 1.24 mg/dL   Calcium 8.6 (L) 8.9 - 10.3 mg/dL   GFR calc non Af Amer 38 (L) >60 mL/min   GFR calc Af Amer 44 (L) >60 mL/min    Comment: (NOTE) The eGFR has been calculated using the CKD EPI equation. This calculation has not been validated in all clinical situations. eGFR's persistently <60 mL/min signify possible Chronic  Kidney Disease.    Anion gap 6 5 - 15  CBC     Status: Abnormal   Collection Time: 12/09/15  4:48 AM  Result Value Ref Range   WBC 7.5 3.8 - 10.6 K/uL   RBC 2.95 (L) 4.40 - 5.90 MIL/uL   Hemoglobin 9.3 (L) 13.0 - 18.0 g/dL   HCT 27.2 (L) 40.0 - 52.0 %   MCV 92.4 80.0 - 100.0 fL   MCH 31.4 26.0 - 34.0 pg   MCHC 34.0 32.0 - 36.0 g/dL   RDW 14.4 11.5 - 14.5 %   Platelets 211 150 - 440 K/uL  Glucose, capillary     Status: Abnormal   Collection Time: 12/09/15  7:46 AM  Result Value Ref Range   Glucose-Capillary 122 (H) 65 - 99 mg/dL  Glucose, capillary     Status: Abnormal   Collection Time: 12/09/15 11:46 AM  Result Value Ref Range   Glucose-Capillary 154 (H) 65 - 99 mg/dL   Dg Chest 2 View  Result Date: 12/08/2015 CLINICAL DATA:  Cough and generalized weakness.  Hypertension EXAM: CHEST  2 VIEW COMPARISON:  November 04, 2014 FINDINGS: Lungs are clear. Heart size and pulmonary vascularity are normal. No adenopathy. There is atherosclerotic calcification in the aortic arch region. No bone lesions. IMPRESSION: Aortic atherosclerosis.  No edema or consolidation. Electronically Signed   By: Lowella Grip III M.D.   On: 12/08/2015 18:35    Assessment:  The patient is a 82 y.o. gentleman with CML who was admitted with acute renal insufficiency secondary to a 2 month history of poor oral intake.  Creatinine clearance is 31 ml/min.  Plan:   1.  Oncology:  CML chronic phase in molecular remission by last testing at [facility].  Patient has tolerated Gleevec well since diagnosis. Given current renal insufficiency, would hold Roca for now.  Depending on renal function, will start back at full dose (400 mg) or 1/2 dose (200 mg) if CrCl remains 20-39 ml/min.  2.  Hematology:  Hematocrit slightly lower than baseline.  Anemia work-up ordered.  Check BCR-ABL.  INR 1.96 on admission.  3.  Nephrology:  Acute renal insufficiency secondary to poor po intake.  Etiology unclear.  Creatinine improving with  hydration (2.21 to 1.65).  Baseline Cr 1.1 -1.3.  Thank you for allowing me to participate in Lucas Caldwell 's care.  I will follow him closely with you while hospitalized and after discharge in the outpatient department.   Lequita Asal, MD  12/09/2015, 1:31 PM

## 2015-12-10 LAB — GLUCOSE, CAPILLARY
GLUCOSE-CAPILLARY: 154 mg/dL — AB (ref 65–99)
GLUCOSE-CAPILLARY: 191 mg/dL — AB (ref 65–99)
Glucose-Capillary: 137 mg/dL — ABNORMAL HIGH (ref 65–99)
Glucose-Capillary: 183 mg/dL — ABNORMAL HIGH (ref 65–99)

## 2015-12-10 LAB — BASIC METABOLIC PANEL
Anion gap: 6 (ref 5–15)
BUN: 17 mg/dL (ref 6–20)
CHLORIDE: 104 mmol/L (ref 101–111)
CO2: 24 mmol/L (ref 22–32)
CREATININE: 1.21 mg/dL (ref 0.61–1.24)
Calcium: 8.9 mg/dL (ref 8.9–10.3)
GFR calc Af Amer: >60 mL/min (ref >60)
GFR calc non Af Amer: 55 mL/min — ABNORMAL LOW (ref >60)
GLUCOSE: 150 mg/dL — AB (ref 65–99)
POTASSIUM: 4.4 mmol/L (ref 3.5–5.1)
SODIUM: 134 mmol/L — AB (ref 135–145)

## 2015-12-10 LAB — RETICULOCYTES
RBC.: 2.94 MIL/uL — ABNORMAL LOW (ref 4.40–5.90)
Retic Count, Absolute: 29.4 10*3/uL (ref 19.0–183.0)
Retic Ct Pct: 1 % (ref 0.4–3.1)

## 2015-12-10 LAB — IRON AND TIBC
Iron: 38 ug/dL — ABNORMAL LOW (ref 45–182)
Saturation Ratios: 11 % — ABNORMAL LOW (ref 17.9–39.5)
TIBC: 334 ug/dL (ref 250–450)
UIBC: 296 ug/dL

## 2015-12-10 LAB — FERRITIN: Ferritin: 27 ng/mL (ref 24–336)

## 2015-12-10 LAB — FOLATE: Folate: 19.2 ng/mL (ref >5.9)

## 2015-12-10 LAB — PROTIME-INR
INR: 1.68
Prothrombin Time: 20 seconds — ABNORMAL HIGH (ref 11.4–15.2)

## 2015-12-10 LAB — OCCULT BLOOD X 1 CARD TO LAB, STOOL: Fecal Occult Bld: NEGATIVE

## 2015-12-10 LAB — VITAMIN B12: Vitamin B-12: 377 pg/mL (ref 180–914)

## 2015-12-10 NOTE — Progress Notes (Signed)
[facility] Hematology/Oncology Progress Note  Date of admission: 12/08/2015  Hospital day:  12/10/2015  Chief Complaint: Lucas Caldwell is a 82 y.o. male with chronic phase CML who was admitted through the emergency room with dehydration and acute renal insufficiency.  Subjective:   Feeling much better today.  He states "I'm going home tomorrow".  Social History: The patient is alone today.  Allergies:  Allergies  Allergen Reactions  . Clopidogrel Anaphylaxis    Other reaction(s): ANAPHYLAXIS;altered mental status;  (electrolytes "out of wack" and confusion per family; THEY DO NOT REMEMBER ANY OTHER REACTION)- MSB 10/10/15  . Ciprofloxacin Itching  . Lyrica [Pregabalin] Itching  . Spironolactone     Other reaction(s): Other (See Comments) gynecomastia  . Plavix [Clopidogrel Bisulfate] Anxiety    Scheduled Medications: . allopurinol  300 mg Oral Daily  . aspirin  81 mg Oral Daily  . insulin aspart  0-9 Units Subcutaneous TID WC  . levothyroxine  50 mcg Oral QAC breakfast  . metoprolol succinate  25 mg Oral Daily  . simvastatin  20 mg Oral QHS  . warfarin  4 mg Oral q1800  . Warfarin - Pharmacist Dosing Inpatient   Does not apply q1800    Review of Systems: GENERAL:  Less fatigue.  No fever overnight.  No ssweats or weight loss. PERFORMANCE STATUS (ECOG):  2 HEENT:  No visual changes, runny nose, sore throat, mouth sores or tenderness. Lungs:  Cough, less.  No shortness of breath.  No hemoptysis. Cardiac:  No chest pain, palpitations, orthopnea, or PND. GI:  No nausea, vomiting, diarrhea, constipation, melena or hematochezia. GU:  No urgency, frequency, dysuria, or hematuria. Musculoskeletal:  No back pain.  No joint pain.  No muscle tenderness. Extremities:  No pain or swelling. Skin:  No rashes or skin changes. Neuro:  No headache, numbness or weakness, balance or coordination issues. Endocrine:  No diabetes.  Thyroid disease on Synthroid.  No  hot flashes or night sweats. Psych:  No mood changes, depression or anxiety. Pain:  No focal pain. Review of systems:  All other systems reviewed and found to be negative.  Physical Exam: Blood pressure 137/60, pulse 75, temperature 98.2 F (36.8 C), temperature source Oral, resp. rate 14, height _0  (1.676 m), weight 186 lb 4.8 oz (84.5 kg), SpO2 100 %.  GENERAL:  Well developed, well nourished, elderly gentleman sitting comfortably on the medical unit in no acute distress. MENTAL STATUS:  Alert and oriented to person, place and time. HEAD:  Dark hair with graying.  Normocephalic, atraumatic, face symmetric, no Cushingoid features. EYES:  Brown eyes.  Pupils equal round and reactive to light and accomodation.  No conjunctivitis or scleral icterus. ENT:  Oropharynx clear without lesion.  Tongue normal. Mucous membranes moist.  RESPIRATORY:  Clear to auscultation without rales, wheezes or rhonchi. CARDIOVASCULAR:  Regular rate and rhythm without murmur, rub or gallop. ABDOMEN:  Soft, non-tender, with active bowel sounds, and no hepatosplenomegaly.  No masses. SKIN:  No rashes, ulcers or lesions. EXTREMITIES: No edema, no skin discoloration or tenderness.  No palpable cords. LYMPH NODES: No palpable cervical, supraclavicular, axillary or inguinal adenopathy  NEUROLOGICAL: Unremarkable. PSYCH:  Appropriate.   Results for orders placed or performed during the hospital encounter of 12/08/15 (from the past 48 hour(s))  Basic metabolic panel     Status: Abnormal   Collection Time: 12/08/15  5:30 PM  Result Value Ref Range   Sodium 131 (L) 135 - 145 mmol/L  Potassium 4.4 3.5 - 5.1 mmol/L   Chloride 102 101 - 111 mmol/L   CO2 21 (L) 22 - 32 mmol/L   Glucose, Bld 214 (H) 65 - 99 mg/dL   BUN 34 (H) 6 - 20 mg/dL   Creatinine, Ser 2.21 (H) 0.61 - 1.24 mg/dL   Calcium 9.1 8.9 - 10.3 mg/dL   GFR calc non Af Amer 26 (L) >60 mL/min   GFR calc Af Amer 31 (L) >60 mL/min    Comment: (NOTE) The  eGFR has been calculated using the CKD EPI equation. This calculation has not been validated in all clinical situations. eGFR's persistently <60 mL/min signify possible Chronic Kidney Disease.    Anion gap 8 5 - 15  CBC     Status: Abnormal   Collection Time: 12/08/15  5:30 PM  Result Value Ref Range   WBC 9.6 3.8 - 10.6 K/uL   RBC 3.13 (L) 4.40 - 5.90 MIL/uL   Hemoglobin 9.9 (L) 13.0 - 18.0 g/dL   HCT 29.2 (L) 40.0 - 52.0 %   MCV 93.3 80.0 - 100.0 fL   MCH 31.6 26.0 - 34.0 pg   MCHC 33.9 32.0 - 36.0 g/dL   RDW 14.5 11.5 - 14.5 %   Platelets 263 150 - 440 K/uL  Troponin I     Status: Abnormal   Collection Time: 12/08/15  5:30 PM  Result Value Ref Range   Troponin I 0.03 (HH) <0.03 ng/mL    Comment: CRITICAL RESULT CALLED TO, READ BACK BY AND VERIFIED WITH TERESA HUDSON AT 2028 12/08/15.PMH  Protime-INR     Status: Abnormal   Collection Time: 12/08/15  5:30 PM  Result Value Ref Range   Prothrombin Time 22.6 (H) 11.4 - 15.2 seconds   INR 1.96   Urinalysis complete, with microscopic     Status: Abnormal   Collection Time: 12/08/15  7:50 PM  Result Value Ref Range   Color, Urine STRAW (A) YELLOW   APPearance CLEAR (A) CLEAR   Glucose, UA NEGATIVE NEGATIVE mg/dL   Bilirubin Urine NEGATIVE NEGATIVE   Ketones, ur NEGATIVE NEGATIVE mg/dL   Specific Gravity, Urine 1.006 1.005 - 1.030   Hgb urine dipstick NEGATIVE NEGATIVE   pH 5.0 5.0 - 8.0   Protein, ur NEGATIVE NEGATIVE mg/dL   Nitrite NEGATIVE NEGATIVE   Leukocytes, UA NEGATIVE NEGATIVE   RBC / HPF NONE SEEN 0 - 5 RBC/hpf   WBC, UA 0-5 0 - 5 WBC/hpf   Bacteria, UA RARE (A) NONE SEEN   Squamous Epithelial / LPF NONE SEEN NONE SEEN   Hyaline Casts, UA PRESENT   Basic metabolic panel     Status: Abnormal   Collection Time: 12/09/15  4:48 AM  Result Value Ref Range   Sodium 136 135 - 145 mmol/L   Potassium 4.2 3.5 - 5.1 mmol/L   Chloride 108 101 - 111 mmol/L   CO2 22 22 - 32 mmol/L   Glucose, Bld 106 (H) 65 - 99 mg/dL    BUN 27 (H) 6 - 20 mg/dL   Creatinine, Ser 1.65 (H) 0.61 - 1.24 mg/dL   Calcium 8.6 (L) 8.9 - 10.3 mg/dL   GFR calc non Af Amer 38 (L) >60 mL/min   GFR calc Af Amer 44 (L) >60 mL/min    Comment: (NOTE) The eGFR has been calculated using the CKD EPI equation. This calculation has not been validated in all clinical situations. eGFR's persistently <60 mL/min signify possible Chronic Kidney Disease.  Anion gap 6 5 - 15  CBC     Status: Abnormal   Collection Time: 12/09/15  4:48 AM  Result Value Ref Range   WBC 7.5 3.8 - 10.6 K/uL   RBC 2.95 (L) 4.40 - 5.90 MIL/uL   Hemoglobin 9.3 (L) 13.0 - 18.0 g/dL   HCT 27.2 (L) 40.0 - 52.0 %   MCV 92.4 80.0 - 100.0 fL   MCH 31.4 26.0 - 34.0 pg   MCHC 34.0 32.0 - 36.0 g/dL   RDW 14.4 11.5 - 14.5 %   Platelets 211 150 - 440 K/uL  Glucose, capillary     Status: Abnormal   Collection Time: 12/09/15  7:46 AM  Result Value Ref Range   Glucose-Capillary 122 (H) 65 - 99 mg/dL  Glucose, capillary     Status: Abnormal   Collection Time: 12/09/15 11:46 AM  Result Value Ref Range   Glucose-Capillary 154 (H) 65 - 99 mg/dL  Glucose, capillary     Status: Abnormal   Collection Time: 12/09/15  5:01 PM  Result Value Ref Range   Glucose-Capillary 143 (H) 65 - 99 mg/dL  Glucose, capillary     Status: Abnormal   Collection Time: 12/09/15  9:50 PM  Result Value Ref Range   Glucose-Capillary 151 (H) 65 - 99 mg/dL  Protime-INR     Status: Abnormal   Collection Time: 12/10/15  4:33 AM  Result Value Ref Range   Prothrombin Time 20.0 (H) 11.4 - 15.2 seconds   INR 1.49   Basic metabolic panel     Status: Abnormal   Collection Time: 12/10/15  4:33 AM  Result Value Ref Range   Sodium 134 (L) 135 - 145 mmol/L   Potassium 4.4 3.5 - 5.1 mmol/L   Chloride 104 101 - 111 mmol/L   CO2 24 22 - 32 mmol/L   Glucose, Bld 150 (H) 65 - 99 mg/dL   BUN 17 6 - 20 mg/dL   Creatinine, Ser 1.21 0.61 - 1.24 mg/dL   Calcium 8.9 8.9 - 10.3 mg/dL   GFR calc non Af Amer 55 (L)  >60 mL/min   GFR calc Af Amer >60 >60 mL/min    Comment: (NOTE) The eGFR has been calculated using the CKD EPI equation. This calculation has not been validated in all clinical situations. eGFR's persistently <60 mL/min signify possible Chronic Kidney Disease.    Anion gap 6 5 - 15  Reticulocytes     Status: Abnormal   Collection Time: 12/10/15  4:33 AM  Result Value Ref Range   Retic Ct Pct 1.0 0.4 - 3.1 %   RBC. 2.94 (L) 4.40 - 5.90 MIL/uL   Retic Count, Manual 29.4 19.0 - 183.0 K/uL  Folate     Status: None   Collection Time: 12/10/15  4:33 AM  Result Value Ref Range   Folate 19.2 >5.9 ng/mL  Ferritin     Status: None   Collection Time: 12/10/15  4:33 AM  Result Value Ref Range   Ferritin 27 24 - 336 ng/mL  Iron and TIBC     Status: Abnormal   Collection Time: 12/10/15  4:33 AM  Result Value Ref Range   Iron 38 (L) 45 - 182 ug/dL   TIBC 334 250 - 450 ug/dL   Saturation Ratios 11 (L) 17.9 - 39.5 %   UIBC 296 ug/dL  Glucose, capillary     Status: Abnormal   Collection Time: 12/10/15  7:24 AM  Result Value Ref Range  Glucose-Capillary 137 (H) 65 - 99 mg/dL  Glucose, capillary     Status: Abnormal   Collection Time: 12/10/15 11:48 AM  Result Value Ref Range   Glucose-Capillary 183 (H) 65 - 99 mg/dL   Dg Chest 2 View  Result Date: 12/08/2015 CLINICAL DATA:  Cough and generalized weakness.  Hypertension EXAM: CHEST  2 VIEW COMPARISON:  November 04, 2014 FINDINGS: Lungs are clear. Heart size and pulmonary vascularity are normal. No adenopathy. There is atherosclerotic calcification in the aortic arch region. No bone lesions. IMPRESSION: Aortic atherosclerosis.  No edema or consolidation. Electronically Signed   By: Lowella Grip III M.D.   On: 12/08/2015 18:35    Assessment:  Lucas Caldwell is a 82 y.o. male with chronic phase CML who was admitted with acute renal insufficiency secondary to a 2 month history of poor oral intake.    Creatine has improved with  hydration.  Plan:   1.  Oncology:  CML chronic phase in molecular remission by last testing at [facility].  Patient has tolerated Gleevec well since diagnosis. [place] currently on hold.  Depending on AM labs, will likely send patient home on full dose Gleevec (400 mg a day) as renal function back to baseline.  BCR-ABL sent.  Patient wishes to follow-up with me in the cancer center.  Discuss plan for follow-up 1 week after discharge.  2.  Hematology:  Hematocrit slightly lower than baseline.  Anemia work-up reveals iron deficiency.  Patient denies any melena or hematochezia.  Will need to determine when patient had last colonoscopy.  INR 1.96 to 1.68.  Coumadin to be adjusted.  3.  Nephrology:  Acute renal insufficiency secondary to poor po intake.  Etiology unclear.  Creatinine improving with hydration (2.21 to 1.65 to 1.21).  Baseline Cr 1.1 -1.3.  4.  Disposition:  Anticipate discharge tomorrow.   Lequita Asal, MD  12/10/2015, 11:59 AM

## 2015-12-10 NOTE — Progress Notes (Signed)
ANTICOAGULATION CONSULT NOTE -follow up [facility] for warfarin dosing Indication: atrial fibrillation  Allergies  Allergen Reactions  . Clopidogrel Anaphylaxis    Other reaction(s): ANAPHYLAXIS;altered mental status;  (electrolytes "out of wack" and confusion per family; THEY DO NOT REMEMBER ANY OTHER REACTION)- MSB 10/10/15  . Ciprofloxacin Itching  . Lyrica [Pregabalin] Itching  . Spironolactone     Other reaction(s): Other (See Comments) gynecomastia  . Plavix [Clopidogrel Bisulfate] Anxiety    Patient Measurements: Height: 5\' 6"  (167.6 cm) Weight: 186 lb 4.8 oz (84.5 kg) IBW/kg (Calculated) : 63.8 Heparin Dosing Weight: n/a  Vital Signs: Temp: 98.2 F (36.8 C) (09/24 0458) Temp Source: Oral (09/24 0458) BP: 137/60 (09/24 0742) Pulse Rate: 75 (09/24 0742)  Labs:  Recent Labs  12/08/15 1730 12/09/15 0448 12/10/15 0433  HGB 9.9* 9.3*  --   HCT 29.2* 27.2*  --   PLT 263 211  --   LABPROT 22.6*  --  20.0*  INR 1.96  --  1.68  CREATININE 2.21* 1.65* 1.21  TROPONINI 0.03*  --   --     Estimated Creatinine Clearance: 49.7 mL/min (by C-G formula based on SCr of 1.21 mg/dL).   Medical History: Past Medical History:  Diagnosis Date  . Breast asymmetry    Left breast is larger, present for several years.  . Cancer ([place])    Chronic myeloid leukemia  . CHF (congestive heart failure) ([place])   . Diabetes mellitus without complication ([place])   . Gout   . Hyperlipemia   . Hypertension   . Thyroid disease      Assessment: INR 1.96 on admission  9/22  INR 1.96 (1730)  No dose charted 9/23  no INR    Warfarin 4 mg 9/24  INR 1.68     Goal of Therapy:  INR 2-3    Plan:  Will continue home regimen of Warfarin 4 mg daily. Since missed dose on 9/22, anticipate INR to increase tomorrow 9/25. Recheck INR in AM.   Collyns Mcquigg A 12/10/2015,11:08 AM

## 2015-12-10 NOTE — Progress Notes (Signed)
Initial Nutrition Assessment  DOCUMENTATION CODES:   Obesity unspecified  INTERVENTION:  -Encourage PO intake  NUTRITION DIAGNOSIS:   Inadequate oral intake related to poor appetite, other (see comment) (gen weakness) as evidenced by per patient/family report.  GOAL:   Patient will meet greater than or equal to 90% of their needs  MONITOR:   PO intake, I & O's, Labs, Weight trends, Supplement acceptance  REASON FOR ASSESSMENT:   Malnutrition Screening Tool    ASSESSMENT:   Lucas Caldwell  is a 82 y.o. male with a known history of CML, hypertension, diabetes mellitus type 2, atrial fibrillation, M in because of generalized weakness, fatigue, poor by mouth intake for 2 months  Lucas Caldwell is doing much better today. Reported hx of poor PO x2 months with concurrent 20# wt loss over that time span. During that time pt reports eat soups, crackers, drinking protein supplements, but did not consume much solid food. Per chart review, pt exhibits a 12#/6% insignificant wt loss over 8 months. Pts reported wt loss would indicate a 20#/9.7% severe wt loss over 2 months. Pt is morbidly obese, NFPA demonstrates no muscle wasting, fat depletions, or edema. Documented PO 75-100% over the last 4 meals. Labs and medications reviewed: CBGS 143-183, Na 134  Diet Order:  Diet heart healthy/carb modified Room service appropriate? Yes; Fluid consistency: Thin  Skin:  Reviewed, no issues  Last BM:  9/22  Height:   Ht Readings from Last 1 Encounters:  12/08/15 5\' 6"  (1.676 m)    Weight:   Wt Readings from Last 1 Encounters:  12/10/15 186 lb 4.8 oz (84.5 kg)    Ideal Body Weight:  64.54 kg  BMI:  Body mass index is 30.07 kg/m.  Estimated Nutritional Needs:   Kcal:  1500-1800 calories  Protein:  65-80 gm  Fluid:  >/= 1.5L  EDUCATION NEEDS:   No education needs identified at this time  Lucas Caldwell. Lucas Belko, MS, RD LDN Inpatient Clinical Dietitian Pager [phone]

## 2015-12-10 NOTE — Progress Notes (Signed)
[place] [facility] [place] NAME: Lucas Caldwell    MR#:  [id number]  DATE OF BIRTH:  1934/02/02  SUBJECTIVE: Admitted for acute renal failure, poor appetite.pt  isfeeling better.   CHIEF COMPLAINT:   Chief Complaint  Patient presents with  . Cough    REVIEW OF SYSTEMS:   ROS CONSTITUTIONAL: No fever, fatigue or weakness.  EYES: No blurred or double vision.  EARS, NOSE, AND THROAT: No tinnitus or ear pain.  RESPIRATORY: No cough, shortness of breath, wheezing or hemoptysis.  CARDIOVASCULAR: No chest pain, orthopnea, edema.  GASTROINTESTINAL: No nausea, vomiting, diarrhea or abdominal pain.  GENITOURINARY: No dysuria, hematuria.  ENDOCRINE: No polyuria, nocturia,  HEMATOLOGY: No anemia, easy bruising or bleeding SKIN: No rash or lesion. MUSCULOSKELETAL: No joint pain or arthritis.   NEUROLOGIC: No tingling, numbness, weakness.  PSYCHIATRY: No anxiety or depression.   DRUG ALLERGIES:   Allergies  Allergen Reactions  . Clopidogrel Anaphylaxis    Other reaction(s): ANAPHYLAXIS;altered mental status;  (electrolytes "out of wack" and confusion per family; THEY DO NOT REMEMBER ANY OTHER REACTION)- MSB 10/10/15  . Ciprofloxacin Itching  . Lyrica [Pregabalin] Itching  . Spironolactone     Other reaction(s): Other (See Comments) gynecomastia  . Plavix [Clopidogrel Bisulfate] Anxiety    VITALS:  Blood pressure 137/60, pulse 75, temperature 98.2 F (36.8 C), temperature source Oral, resp. rate 14, height 5\' 6"  (1.676 m), weight 84.5 kg (186 lb 4.8 oz), SpO2 100 %.  PHYSICAL EXAMINATION:  GENERAL:  82 y.o.-year-old patient lying in the bed with no acute distress.  EYES: Pupils equal, round, reactive to light and accommodation. No scleral icterus. Extraocular muscles intact.  HEENT: Head atraumatic, normocephalic. Oropharynx and nasopharynx clear.  NECK:  Supple, no jugular venous distention. No thyroid enlargement, no tenderness.   LUNGS: Normal breath sounds bilaterally, no wheezing, rales,rhonchi or crepitation. No use of accessory muscles of respiration.  CARDIOVASCULAR: S1, S2 normal. No murmurs, rubs, or gallops.  ABDOMEN: Soft, nontender, nondistended. Bowel sounds present. No organomegaly or mass.  EXTREMITIES: No pedal edema, cyanosis, or clubbing.  NEUROLOGIC: Cranial nerves II through XII are intact. Muscle strength 5/5 in all extremities. Sensation intact. Gait not checked.  PSYCHIATRIC: The patient is alert and oriented x 3.  SKIN: No obvious rash, lesion, or ulcer.    LABORATORY PANEL:   CBC  Recent Labs Lab 12/09/15 0448  WBC 7.5  HGB 9.3*  HCT 27.2*  PLT 211   ------------------------------------------------------------------------------------------------------------------  Chemistries   Recent Labs Lab 12/10/15 0433  NA 134*  K 4.4  CL 104  CO2 24  GLUCOSE 150*  BUN 17  CREATININE 1.21  CALCIUM 8.9   ------------------------------------------------------------------------------------------------------------------  Cardiac Enzymes  Recent Labs Lab 12/08/15 1730  TROPONINI 0.03*   ------------------------------------------------------------------------------------------------------------------  RADIOLOGY:  Dg Chest 2 View  Result Date: 12/08/2015 CLINICAL DATA:  Cough and generalized weakness.  Hypertension EXAM: CHEST  2 VIEW COMPARISON:  November 04, 2014 FINDINGS: Lungs are clear. Heart size and pulmonary vascularity are normal. No adenopathy. There is atherosclerotic calcification in the aortic arch region. No bone lesions. IMPRESSION: Aortic atherosclerosis.  No edema or consolidation. Electronically Signed   By: Lowella Grip III M.D.   On: 12/08/2015 18:35    EKG:   Orders placed or performed during the hospital encounter of 12/08/15  . ED EKG  . ED EKG  . EKG 12-Lead  . EKG 12-Lead    ASSESSMENT AND PLAN:   #1 acute  renal failure: Prerenal secondary to  poor by mouth intake for last few months. Patient is improving with IV hydration. IV fluids. Likely discharge tomorrow, resume Lasix and lisinopril as an outpatient.  #2 history of CML: Patient takes gleevac but it is on on hold due to renal failure. Patient wants to follow up with Dr. Mike Caldwell. Seen by oncology..  #3 history of diabetes mellitus type 2: Continue SSI coverage, hold the Lantus  Due to BG level.. #4 essential hypertension: Controlled. #5. chronic atrial fibrillation rate controlled with beta blockers. Continue Coumadin. Patient is a 55% by the echo done in April of this year at [facility]. Patient has diastolic dysfunction.  #6 .weakness: Deconditioning: Physical therapy consult today. ikely discharge tomorrow.  Discussed with patient's registered nurse.  All the records are reviewed and case discussed with Care Management/Social Workerr. Management plans discussed with the patient, family and they are in agreement.  CODE STATUS: full  TOTAL TIME TAKING CARE OF THIS PATIENT: 31minutes.   POSSIBLE D/C IN 1-2 DAYS, DEPENDING ON CLINICAL CONDITION.   Lucas Caldwell M.D on 12/10/2015 at 10:22 AM  Between 7am to 6pm - Pager - [phone]  After 6pm go to [email or web address] - password [email or web address] [facility]  Office  [phone]  CC: Primary care physician; Lucas Anton, MD   Note: This dictation was prepared with Dragon dictation along with smaller phrase technology. Any transcriptional errors that result from this process are unintentional.

## 2015-12-11 LAB — PROTIME-INR
INR: 1.63
Prothrombin Time: 19.5 seconds — ABNORMAL HIGH (ref 11.4–15.2)

## 2015-12-11 LAB — GLUCOSE, CAPILLARY
GLUCOSE-CAPILLARY: 160 mg/dL — AB (ref 65–99)
Glucose-Capillary: 153 mg/dL — ABNORMAL HIGH (ref 65–99)

## 2015-12-11 MED ORDER — LANTUS SOLOSTAR 100 UNIT/ML ~~LOC~~ SOPN: 20.0000 [IU] | PEN_INJECTOR | Freq: Every day | SUBCUTANEOUS | 0 refills | Status: DC

## 2015-12-11 NOTE — Care Management (Signed)
Admitted to [facility] with the diagnosis of acute renal failure. Lives with wife, [name] [phone]). Home health about 8 years following heart stent placement, Don't remember name of agency. No skilled facility. No home oxygen. Uses a cane for balance, rolling walker in the home if needed. No falls. Good appetite until 2 months ago.  Lost 5 pounds. Takes care of all basic and instrumental activities of daily living himself, drives. Prescriptions are filled at [facility].  Discharge to home today per Dr. Bridgett Larsson. Wife will transport. No follow-up needs identified. Shelbie Ammons RN MSN CCM Care Management [phone]

## 2015-12-11 NOTE — Care Management Important Message (Signed)
Important Message  Patient Details  Name: Lucas Caldwell MRN: [id number] Date of Birth: June 05, 1933   Medicare Important Message Given:  Yes    Shelbie Ammons, RN 12/11/2015, 9:29 AM

## 2015-12-11 NOTE — Discharge Summary (Signed)
[facility] NAME: Lucas Caldwell    MR#:  [id number]  DATE OF BIRTH:  [birth year]  DATE OF ADMISSION:  12/08/2015   ADMITTING PHYSICIAN: Epifanio Lesches, MD  DATE OF DISCHARGE: 12/11/2015 10:07 AM  PRIMARY CARE PHYSICIAN: BARNES, Quitman Livings, MD   ADMISSION DIAGNOSIS:  Weakness [R53.1] Acute kidney injury ([place]) [N17.9] DISCHARGE DIAGNOSIS:  Active Problems:   Acute renal failure (ARF) ([place])  SECONDARY DIAGNOSIS:   Past Medical History:  Diagnosis Date  . Breast asymmetry    Left breast is larger, present for several years.  . Cancer ([place])    Chronic myeloid leukemia  . CHF (congestive heart failure) ([place])   . Diabetes mellitus without complication ([place])   . Gout   . Hyperlipemia   . Hypertension   . Thyroid disease    HOSPITAL COURSE:   #1 acute renal failure: Prerenal secondary to poor by mouth intake for last few months. Improved with IV hydration, resume Lasix and lisinopril as an outpatient.  #2 history of CML: Patient takes gleevac but it is on on hold due to renal failure. Patient wants to follow up with Dr. Mike Gip. Seen by oncology..  #3 history of diabetes mellitus type 2: Continue SSI coverage, hold the Lantus  Due to BG level. Since the patient's blood sugar is controlled with sliding scale but without Lantus, I suggested decrease Lantus to 20 units at at bedtime as outpatient, follow-up PCP to the chest dose depending on blood sugar.  #4 essential hypertension: Controlled. #5. chronic atrial fibrillation rate controlled with beta blockers. Continue Coumadin. Patient is a 80% by the echo done in April of this year at [facility]. Patient has diastolic dysfunction.  #6 .weakness: The patient feels better, he doesn't want PT evaluation.  DISCHARGE CONDITIONS:  Stable, discharged to home today. CONSULTS OBTAINED:  Treatment Team:  Lequita Asal, MD DRUG ALLERGIES:   Allergies  Allergen Reactions  .  Clopidogrel Anaphylaxis    Other reaction(s): ANAPHYLAXIS;altered mental status;  (electrolytes "out of wack" and confusion per family; THEY DO NOT REMEMBER ANY OTHER REACTION)- MSB 10/10/15  . Ciprofloxacin Itching  . Lyrica [Pregabalin] Itching  . Spironolactone     Other reaction(s): Other (See Comments) gynecomastia  . Plavix [Clopidogrel Bisulfate] Anxiety   DISCHARGE MEDICATIONS:     Medication List    TAKE these medications   albuterol 108 (90 Base) MCG/ACT inhaler Commonly known as:  PROVENTIL HFA;VENTOLIN HFA Inhale 2 puffs into the lungs every 6 (six) hours as needed for wheezing or shortness of breath.   allopurinol 300 MG tablet Commonly known as:  ZYLOPRIM Take 300 mg by mouth every evening.   aspirin EC 81 MG tablet Take 81 mg by mouth every morning.   atropine 1 % ophthalmic solution Place 1 drop into the right eye at bedtime.   digoxin 0.125 MG tablet Commonly known as:  LANOXIN Take 125 mcg by mouth at bedtime.   enalapril 5 MG tablet Commonly known as:  VASOTEC Take 5 mg by mouth 2 (two) times daily.   eplerenone 25 MG tablet Commonly known as:  INSPRA Take 25 mg by mouth every evening.   furosemide 40 MG tablet Commonly known as:  LASIX Take 1 tablet by mouth See admin instructions. Take 40 mg by mouth every morning and take 40 mg by mouth at lunchtime as needed for swelling or fluid retention.   LANTUS SOLOSTAR 100 UNIT/ML Solostar Pen Generic drug:  Insulin Glargine Inject 20 Units into the skin at bedtime. What changed:  how much to take   levothyroxine 50 MCG tablet Commonly known as:  SYNTHROID, LEVOTHROID Take 50 mcg by mouth daily before breakfast.   metoprolol succinate 50 MG 24 hr tablet Commonly known as:  TOPROL-XL Take 50 mg by mouth every morning.   mirtazapine 15 MG tablet Commonly known as:  REMERON Take 15 mg by mouth at bedtime.   MULTIVITAMIN ADULTS 50+ PO Take 1 tablet by mouth every morning.   omeprazole 40 MG  capsule Commonly known as:  PRILOSEC Take 40 mg by mouth every morning.   OPTICHAMBER ADVANTAGE Misc 1 each by Other route once.   oxyCODONE 5 MG immediate release tablet Commonly known as:  Oxy IR/ROXICODONE Take 5 mg by mouth every 6 (six) hours as needed for pain.   simvastatin 20 MG tablet Commonly known as:  ZOCOR Take 20 mg by mouth every evening.   SPIRIVA HANDIHALER 18 MCG inhalation capsule Generic drug:  tiotropium Place 18 mcg into inhaler and inhale every morning.   warfarin 2 MG tablet Commonly known as:  COUMADIN Take 4 mg by mouth at bedtime.        DISCHARGE INSTRUCTIONS:   DIET:  Heart healthy and ADA diet. DISCHARGE CONDITION:  Stable ACTIVITY:  As tolerated DISCHARGE LOCATION:    If you experience worsening of your admission symptoms, develop shortness of breath, life threatening emergency, suicidal or homicidal thoughts you must seek medical attention immediately by calling 911 or calling your MD immediately  if symptoms less severe.  You Must read complete instructions/literature along with all the possible adverse reactions/side effects for all the Medicines you take and that have been prescribed to you. Take any new Medicines after you have completely understood and accpet all the possible adverse reactions/side effects.   Please note  You were cared for by a hospitalist during your hospital stay. If you have any questions about your discharge medications or the care you received while you were in the hospital after you are discharged, you can call the unit and asked to speak with the hospitalist on call if the hospitalist that took care of you is not available. Once you are discharged, your primary care physician will handle any further medical issues. Please note that NO REFILLS for any discharge medications will be authorized once you are discharged, as it is imperative that you return to your primary care physician (or establish a relationship  with a primary care physician if you do not have one) for your aftercare needs so that they can reassess your need for medications and monitor your lab values.    On the day of Discharge:  VITAL SIGNS:  Blood pressure (!) 144/57, pulse 73, temperature 98.4 F (36.9 C), temperature source Oral, resp. rate 18, height 5\' 6"  (1.676 m), weight 188 lb 4.8 oz (85.4 kg), SpO2 100 %. PHYSICAL EXAMINATION:  GENERAL:  82 y.o.-year-old patient lying in the bed with no acute distress.  EYES: Pupils equal, round, reactive to light and accommodation. No scleral icterus. Extraocular muscles intact.  HEENT: Head atraumatic, normocephalic. Oropharynx and nasopharynx clear.  NECK:  Supple, no jugular venous distention. No thyroid enlargement, no tenderness.  LUNGS: Normal breath sounds bilaterally, no wheezing, rales,rhonchi or crepitation. No use of accessory muscles of respiration.  CARDIOVASCULAR: S1, S2 normal. No murmurs, rubs, or gallops.  ABDOMEN: Soft, non-tender, non-distended. Bowel sounds present. No organomegaly or mass.  EXTREMITIES: No pedal edema,  cyanosis, or clubbing.  NEUROLOGIC: Cranial nerves II through XII are intact. Muscle strength 5/5 in all extremities. Sensation intact. Gait not checked.  PSYCHIATRIC: The patient is alert and oriented x 3.  SKIN: No obvious rash, lesion, or ulcer.  DATA REVIEW:   CBC  Recent Labs Lab 12/09/15 0448  WBC 7.5  HGB 9.3*  HCT 27.2*  PLT 211    Chemistries   Recent Labs Lab 12/10/15 0433  NA 134*  K 4.4  CL 104  CO2 24  GLUCOSE 150*  BUN 17  CREATININE 1.21  CALCIUM 8.9     Microbiology Results  Results for orders placed or performed in visit on 05/03/12  Culture, blood (single)     Status: None   Collection Time: 05/03/12  5:50 PM  Result Value Ref Range Status   Micro Text Report   Final       COMMENT                   NO GROWTH AEROBICALLY IN 5 DAYS   ANTIBIOTIC                                                        Culture, blood (single)     Status: None   Collection Time: 05/03/12  5:50 PM  Result Value Ref Range Status   Micro Text Report   Final       COMMENT                   NO GROWTH AEROBICALLY IN 5 DAYS   ANTIBIOTIC                                                      Culture, blood (single)     Status: None   Collection Time: 05/03/12  5:54 PM  Result Value Ref Range Status   Micro Text Report   Final       COMMENT                   NO GROWTH AEROBICALLY/ANAEROBICALLY IN 5 DAYS   ANTIBIOTIC                                                        RADIOLOGY:  No results found.   Management plans discussed with the patient, His wife and they are in agreement.  CODE STATUS:  Code Status History    Date Active Date Inactive Code Status Order ID Comments User Context   12/08/2015  8:21 PM 12/11/2015  1:15 PM Full Code [id number]  Epifanio Lesches, MD ED      TOTAL TIME TAKING CARE OF THIS PATIENT: 33 minutes.    Demetrios Loll M.D on 12/11/2015 at 5:21 PM  Between 7am to 6pm - Pager - [phone]  After 6pm go to [email or web address] - Proofreader  [facility]  Office  [phone]  CC: Primary care physician; Jeanella Anton, MD   Note:  This dictation was prepared with Dragon dictation along with smaller phrase technology. Any transcriptional errors that result from this process are unintentional.

## 2015-12-11 NOTE — Discharge Instructions (Signed)
Heart healthy and ADA diet. Activity as tolerated. Follow up PCP to adjust lantus dose and follow up INR.

## 2015-12-11 NOTE — Progress Notes (Signed)
ANTICOAGULATION CONSULT NOTE -follow up [facility] for warfarin dosing Indication: atrial fibrillation  Allergies  Allergen Reactions  . Clopidogrel Anaphylaxis    Other reaction(s): ANAPHYLAXIS;altered mental status;  (electrolytes "out of wack" and confusion per family; THEY DO NOT REMEMBER ANY OTHER REACTION)- MSB 10/10/15  . Ciprofloxacin Itching  . Lyrica [Pregabalin] Itching  . Spironolactone     Other reaction(s): Other (See Comments) gynecomastia  . Plavix [Clopidogrel Bisulfate] Anxiety    Patient Measurements: Height: 5\' 6"  (167.6 cm) Weight: 188 lb 4.8 oz (85.4 kg) IBW/kg (Calculated) : 63.8 Heparin Dosing Weight: n/a  Vital Signs: Temp: 98.4 F (36.9 C) (09/25 0755) Temp Source: Oral (09/25 0755) BP: 137/66 (09/25 0755) Pulse Rate: 69 (09/25 0755)  Labs:  Recent Labs  12/08/15 1730 12/09/15 0448 12/10/15 0433 12/11/15 0416  HGB 9.9* 9.3*  --   --   HCT 29.2* 27.2*  --   --   PLT 263 211  --   --   LABPROT 22.6*  --  20.0* 19.5*  INR 1.96  --  1.68 1.63  CREATININE 2.21* 1.65* 1.21  --   TROPONINI 0.03*  --   --   --     Estimated Creatinine Clearance: 49.9 mL/min (by C-G formula based on SCr of 1.21 mg/dL).   Medical History: Past Medical History:  Diagnosis Date  . Breast asymmetry    Left breast is larger, present for several years.  . Cancer ([place])    Chronic myeloid leukemia  . CHF (congestive heart failure) ([place])   . Diabetes mellitus without complication ([place])   . Gout   . Hyperlipemia   . Hypertension   . Thyroid disease      Assessment: INR 1.96 on admission  9/22  INR 1.96 (1730)  No dose charted 9/23  no INR    Warfarin 4 mg 9/24  INR 1.68  Warfarin 4rmg 9/25  INR 1.63   Goal of Therapy:  INR 2-3    Plan:  Will continue home regimen of Warfarin 4 mg daily. Since missed dose on 9/22, anticipate INR to increase tomorrow 9/26. Recheck INR in AM.   Paulina Fusi, PharmD, BCPS 12/11/2015 9:05 AM

## 2015-12-11 NOTE — Progress Notes (Signed)
Discharge instructions along with home medications and follow up gone over with patient and wife. Both verbalize that they understood instructions. No prescriptions given to patient. IV removed. Pt being discharged home on room air, no distress noted. Ammie Dalton, RN

## 2015-12-20 LAB — BCR-ABL1 FISH
Cells Analyzed: 200
Cells Counted: 200

## 2015-12-29 ENCOUNTER — Inpatient Hospital Stay: Payer: Medicare HMO | Admitting: Hematology and Oncology

## 2015-12-29 DIAGNOSIS — C9211 Chronic myeloid leukemia, BCR/ABL-positive, in remission: Secondary | ICD-10-CM | POA: Insufficient documentation

## 2015-12-29 NOTE — Progress Notes (Deleted)
[facility] day:  12/29/2015  Chief Complaint: Lucas Caldwell is a 82 y.o. 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  HPI:  The patient was diagnosed with chronic phase CML on 09/2012. He presented with a white count of 41,900. BCR-ABL was positive as was t(9;22).  He has been on imatinib since that time. He has tolerated his treatment well.  He has been predominantly on a dose of 400 mg a day although in 04/2015 dose was reduced to 400 mg every other day due to an interaction with an increased dose eplerenone and evidence of dehydration.  Labs on 10/13/2015 revealed a hematocrit of 30.2, hemoglobin 10.1, MCV 96.7, platelets 246,000, WBC 9,400.  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   He has completed a course of antibiotics. He has done well.  He states that over the past 2 months since his infected toe, he has lost weight secondary to decreased appetite. He's had a cough and phlegm production for greater than a month. He's had a fever.    He presented to the emergency room at [facility].  Creatinine was 2.21 with a prior value of 1.35 on 04/19/2015. CrCl is 31 ml/min.  Chest x-ray was negative.  He was started on IV fluids.  The patient was admitted to [facility] from 12/08/2015 - 12/11/2015 with acute renal insufficiency secondary to decreased oral intake.  He improved with hydration.  Hematocrit was slightly lower than baseline. Anemia work-up revealed iron deficiency.  Patient denied any melena or hematochezia.  Will need to determine when patient had last colonoscopy.INR 1.96 to 1.68.  Coumadin was adjusted.  Past Medical History:  Diagnosis Date  . Breast asymmetry    Left breast is larger, present for several years.  . Cancer ([place])    Chronic myeloid leukemia  . CHF (congestive heart failure) ([place])   . Diabetes mellitus without complication ([place])   . Gout   .  Hyperlipemia   . Hypertension   . Thyroid disease     Past Surgical History:  Procedure Laterality Date  . CARDIAC CATHETERIZATION     2 stents  . HERNIA REPAIR      No family history on file.  Social History:  reports that he has quit smoking. He has never used smokeless tobacco. He reports that he does not drink alcohol. His drug history is not on file.  The patient is accompanied by *** alone today.  Allergies:  Allergies  Allergen Reactions  . Clopidogrel Anaphylaxis    Other reaction(s): ANAPHYLAXIS;altered mental status;  (electrolytes "out of wack" and confusion per family; THEY DO NOT REMEMBER ANY OTHER REACTION)- MSB 10/10/15  . Ciprofloxacin Itching  . Lyrica [Pregabalin] Itching  . Spironolactone     Other reaction(s): Other (See Comments) gynecomastia  . Plavix [Clopidogrel Bisulfate] Anxiety    Current Medications: Current Outpatient Prescriptions  Medication Sig Dispense Refill  . albuterol (PROVENTIL HFA;VENTOLIN HFA) 108 (90 BASE) MCG/ACT inhaler Inhale 2 puffs into the lungs every 6 (six) hours as needed for wheezing or shortness of breath. 1 Inhaler 2  . allopurinol (ZYLOPRIM) 300 MG tablet Take 300 mg by mouth every evening.  12  . aspirin EC 81 MG tablet Take 81 mg by mouth every morning.    [name] atropine 1 % ophthalmic solution Place 1 drop into the right eye at bedtime.    . digoxin (LANOXIN) 0.125 MG tablet  Take 125 mcg by mouth at bedtime.  12  . enalapril (VASOTEC) 5 MG tablet Take 5 mg by mouth 2 (two) times daily.  11  . eplerenone (INSPRA) 25 MG tablet Take 25 mg by mouth every evening.  6  . furosemide (LASIX) 40 MG tablet Take 1 tablet by mouth See admin instructions. Take 40 mg by mouth every morning and take 40 mg by mouth at lunchtime as needed for swelling or fluid retention.  12  . LANTUS SOLOSTAR 100 UNIT/ML Solostar Pen Inject 20 Units into the skin at bedtime. 15 mL 0  . levothyroxine (SYNTHROID, LEVOTHROID) 50 MCG tablet Take 50 mcg by  mouth daily before breakfast.  12  . metoprolol succinate (TOPROL-XL) 50 MG 24 hr tablet Take 50 mg by mouth every morning.  12  . mirtazapine (REMERON) 15 MG tablet Take 15 mg by mouth at bedtime.  3  . Multiple Vitamins-Minerals (MULTIVITAMIN ADULTS 50+ PO) Take 1 tablet by mouth every morning.    [name] omeprazole (PRILOSEC) 40 MG capsule Take 40 mg by mouth every morning.  12  . oxyCODONE (OXY IR/ROXICODONE) 5 MG immediate release tablet Take 5 mg by mouth every 6 (six) hours as needed for pain.  0  . simvastatin (ZOCOR) 20 MG tablet Take 20 mg by mouth every evening.    [name] Spacer/Aero-Holding Chambers (OPTICHAMBER ADVANTAGE) MISC 1 each by Other route once. 1 each 0  . SPIRIVA HANDIHALER 18 MCG inhalation capsule Place 18 mcg into inhaler and inhale every morning.  11  . warfarin (COUMADIN) 2 MG tablet Take 4 mg by mouth at bedtime.  12   No current facility-administered medications for this visit.     Review of Systems:  GENERAL:  Feels good.  Active.  No fevers, sweats or weight loss. PERFORMANCE STATUS (ECOG):  *** HEENT:  No visual changes, runny nose, sore throat, mouth sores or tenderness. Lungs: No shortness of breath or cough.  No hemoptysis. Cardiac:  No chest pain, palpitations, orthopnea, or PND. GI:  No nausea, vomiting, diarrhea, constipation, melena or hematochezia. GU:  No urgency, frequency, dysuria, or hematuria. Musculoskeletal:  No back pain.  No joint pain.  No muscle tenderness. Extremities:  No pain or swelling. Skin:  No rashes or skin changes. Neuro:  No headache, numbness or weakness, balance or coordination issues. Endocrine:  No diabetes, thyroid issues, hot flashes or night sweats. Psych:  No mood changes, depression or anxiety. Pain:  No focal pain. Review of systems:  All other systems reviewed and found to be negative.   Physical Exam: There were no vitals taken for this visit. GENERAL:  Well developed, well nourished, sitting comfortably in the exam  room in no acute distress. MENTAL STATUS:  Alert and oriented to person, place and time. HEAD:  *** hair.  Normocephalic, atraumatic, face symmetric, no Cushingoid features. EYES:  *** eyes.  Pupils equal round and reactive to light and accomodation.  No conjunctivitis or scleral icterus. ENT:  Oropharynx clear without lesion.  Tongue normal. Mucous membranes moist.  RESPIRATORY:  Clear to auscultation without rales, wheezes or rhonchi. CARDIOVASCULAR:  Regular rate and rhythm without murmur, rub or gallop. ABDOMEN:  Soft, non-tender, with active bowel sounds, and no hepatosplenomegaly.  No masses. SKIN:  No rashes, ulcers or lesions. EXTREMITIES: No edema, no skin discoloration or tenderness.  No palpable cords. LYMPH NODES: No palpable cervical, supraclavicular, axillary or inguinal adenopathy  NEUROLOGICAL: Unremarkable. PSYCH:  Appropriate.  No visits with results within  3 Day(s) from this visit.  Latest known visit with results is:  Admission on 12/08/2015, Discharged on 12/11/2015  Component Date Value Ref Range Status  . Sodium 12/08/2015 131* 135 - 145 mmol/L Final  . Potassium 12/08/2015 4.4  3.5 - 5.1 mmol/L Final  . Chloride 12/08/2015 102  101 - 111 mmol/L Final  . CO2 12/08/2015 21* 22 - 32 mmol/L Final  . Glucose, Bld 12/08/2015 214* 65 - 99 mg/dL Final  . BUN 12/08/2015 34* 6 - 20 mg/dL Final  . Creatinine, Ser 12/08/2015 2.21* 0.61 - 1.24 mg/dL Final  . Calcium 12/08/2015 9.1  8.9 - 10.3 mg/dL Final  . GFR calc non Af Amer 12/08/2015 26* >60 mL/min Final  . GFR calc Af Amer 12/08/2015 31* >60 mL/min Final   Comment: (NOTE) The eGFR has been calculated using the CKD EPI equation. This calculation has not been validated in all clinical situations. eGFR's persistently <60 mL/min signify possible Chronic Kidney Disease.   . Anion gap 12/08/2015 8  5 - 15 Final  . WBC 12/08/2015 9.6  3.8 - 10.6 K/uL Final  . RBC 12/08/2015 3.13* 4.40 - 5.90 MIL/uL Final  . Hemoglobin  12/08/2015 9.9* 13.0 - 18.0 g/dL Final  . HCT 12/08/2015 29.2* 40.0 - 52.0 % Final  . MCV 12/08/2015 93.3  80.0 - 100.0 fL Final  . MCH 12/08/2015 31.6  26.0 - 34.0 pg Final  . MCHC 12/08/2015 33.9  32.0 - 36.0 g/dL Final  . RDW 12/08/2015 14.5  11.5 - 14.5 % Final  . Platelets 12/08/2015 263  150 - 440 K/uL Final  . Color, Urine 12/08/2015 STRAW* YELLOW Final  . APPearance 12/08/2015 CLEAR* CLEAR Final  . Glucose, UA 12/08/2015 NEGATIVE  NEGATIVE mg/dL Final  . Bilirubin Urine 12/08/2015 NEGATIVE  NEGATIVE Final  . Ketones, ur 12/08/2015 NEGATIVE  NEGATIVE mg/dL Final  . Specific Gravity, Urine 12/08/2015 1.006  1.005 - 1.030 Final  . Hgb urine dipstick 12/08/2015 NEGATIVE  NEGATIVE Final  . pH 12/08/2015 5.0  5.0 - 8.0 Final  . Protein, ur 12/08/2015 NEGATIVE  NEGATIVE mg/dL Final  . Nitrite 12/08/2015 NEGATIVE  NEGATIVE Final  . Leukocytes, UA 12/08/2015 NEGATIVE  NEGATIVE Final  . RBC / HPF 12/08/2015 NONE SEEN  0 - 5 RBC/hpf Final  . WBC, UA 12/08/2015 0-5  0 - 5 WBC/hpf Final  . Bacteria, UA 12/08/2015 RARE* NONE SEEN Final  . Squamous Epithelial / LPF 12/08/2015 NONE SEEN  NONE SEEN Final  . Hyaline Casts, UA 12/08/2015 PRESENT   Final  . Troponin I 12/08/2015 0.03* <0.03 ng/mL Final  . Sodium 12/09/2015 136  135 - 145 mmol/L Final  . Potassium 12/09/2015 4.2  3.5 - 5.1 mmol/L Final  . Chloride 12/09/2015 108  101 - 111 mmol/L Final  . CO2 12/09/2015 22  22 - 32 mmol/L Final  . Glucose, Bld 12/09/2015 106* 65 - 99 mg/dL Final  . BUN 12/09/2015 27* 6 - 20 mg/dL Final  . Creatinine, Ser 12/09/2015 1.65* 0.61 - 1.24 mg/dL Final  . Calcium 12/09/2015 8.6* 8.9 - 10.3 mg/dL Final  . GFR calc non Af Amer 12/09/2015 38* >60 mL/min Final  . GFR calc Af Amer 12/09/2015 44* >60 mL/min Final   Comment: (NOTE) The eGFR has been calculated using the CKD EPI equation. This calculation has not been validated in all clinical situations. eGFR's persistently <60 mL/min signify possible  Chronic Kidney Disease.   . Anion gap 12/09/2015 6  5 - 15  Final  . WBC 12/09/2015 7.5  3.8 - 10.6 K/uL Final  . RBC 12/09/2015 2.95* 4.40 - 5.90 MIL/uL Final  . Hemoglobin 12/09/2015 9.3* 13.0 - 18.0 g/dL Final  . HCT 12/09/2015 27.2* 40.0 - 52.0 % Final  . MCV 12/09/2015 92.4  80.0 - 100.0 fL Final  . MCH 12/09/2015 31.4  26.0 - 34.0 pg Final  . MCHC 12/09/2015 34.0  32.0 - 36.0 g/dL Final  . RDW 12/09/2015 14.4  11.5 - 14.5 % Final  . Platelets 12/09/2015 211  150 - 440 K/uL Final  . Prothrombin Time 12/08/2015 22.6* 11.4 - 15.2 seconds Final  . INR 12/08/2015 1.96   Final  . Glucose-Capillary 12/09/2015 122* 65 - 99 mg/dL Final  . Glucose-Capillary 12/09/2015 154* 65 - 99 mg/dL Final  . Prothrombin Time 12/10/2015 20.0* 11.4 - 15.2 seconds Final  . INR 12/10/2015 1.68   Final  . Sodium 12/10/2015 134* 135 - 145 mmol/L Final  . Potassium 12/10/2015 4.4  3.5 - 5.1 mmol/L Final  . Chloride 12/10/2015 104  101 - 111 mmol/L Final  . CO2 12/10/2015 24  22 - 32 mmol/L Final  . Glucose, Bld 12/10/2015 150* 65 - 99 mg/dL Final  . BUN 12/10/2015 17  6 - 20 mg/dL Final  . Creatinine, Ser 12/10/2015 1.21  0.61 - 1.24 mg/dL Final  . Calcium 12/10/2015 8.9  8.9 - 10.3 mg/dL Final  . GFR calc non Af Amer 12/10/2015 55* >60 mL/min Final  . GFR calc Af Amer 12/10/2015 >60  >60 mL/min Final   Comment: (NOTE) The eGFR has been calculated using the CKD EPI equation. This calculation has not been validated in all clinical situations. eGFR's persistently <60 mL/min signify possible Chronic Kidney Disease.   . Anion gap 12/10/2015 6  5 - 15 Final  . Retic Ct Pct 12/10/2015 1.0  0.4 - 3.1 % Final  . RBC. 12/10/2015 2.94* 4.40 - 5.90 MIL/uL Final  . Retic Count, Manual 12/10/2015 29.4  19.0 - 183.0 K/uL Final  . Vitamin B-12 12/10/2015 377  180 - 914 pg/mL Final   Comment: (NOTE) This assay is not validated for testing neonatal or myeloproliferative syndrome specimens for Vitamin B12  levels. Performed at [facility]   . Folate 12/10/2015 19.2  >5.9 ng/mL Final  . Ferritin 12/10/2015 27  24 - 336 ng/mL Final  . Iron 12/10/2015 38* 45 - 182 ug/dL Final  . TIBC 12/10/2015 334  250 - 450 ug/dL Final  . Saturation Ratios 12/10/2015 11* 17.9 - 39.5 % Final  . UIBC 12/10/2015 296  ug/dL Final  . Specimen Type 12/20/2015 BLOOD   Final  . Cells Counted 12/20/2015 200   Final  . Cells Analyzed 12/20/2015 200   Final  . FISH Result 12/20/2015 Comment:   Final  . Interpretation 12/20/2015 Comment:   Final   Comment: (NOTE) .nuc ish 9q34(ASS1,ABL1)x2,22q11.2(BCRx2)[200].    The fluorescence in situ hybridization (FISH) study was negative. FISH, using unique sequence DNA probes for the ABL1 and BCR gene regions showed two ABL1 signals (red), two control ASS1  gene signals (aqua) located adjacent to the ABL1 locus at 9q34, and two BCR signals (green) at 22q11.2 in all interphase nuclei examined. There was NO evidence of CML or ALL-associated BCR/ABL1 dual fusion signals in this analysis.    This analysis is limited to abnormalities detectable by the specific probes included in the study. FISH results should be interpreted within the context of a full cytogenetic  analysis and hematologic evaluation.    This test was developed and its performance characteristics determined by [facility] [employer]). It has not been cleared or approved by the U.S. Food and Drug Administration. The DNA probe vendor for this study was [employer] (physical sciences)).   . Director Review: 12/20/2015 Comment:   Final   Comment: (NOTE) M. Sinclair Grooms, PhD, [facility] Performed At: [facility] [address] [place], [place] [id number] Nechama Guard MD ZB:[id number]   . Glucose-Capillary 12/09/2015 143* 65 - 99 mg/dL Final  . Glucose-Capillary 12/09/2015 151* 65 - 99 mg/dL Final  . Glucose-Capillary 12/10/2015 137* 65 - 99 mg/dL Final  .  Glucose-Capillary 12/10/2015 183* 65 - 99 mg/dL Final  . Prothrombin Time 12/11/2015 19.5* 11.4 - 15.2 seconds Final  . INR 12/11/2015 1.63   Final  . Glucose-Capillary 12/10/2015 154* 65 - 99 mg/dL Final  . Fecal Occult Bld 12/10/2015 NEGATIVE  NEGATIVE Final  . Glucose-Capillary 12/10/2015 191* 65 - 99 mg/dL Final  . Glucose-Capillary 12/11/2015 160* 65 - 99 mg/dL Final  . Glucose-Capillary 12/11/2015 153* 65 - 99 mg/dL Final    Assessment:  Lucas Caldwell is a 82 y.o. male with chronic phase CML who was admitted with acute renal insufficiency secondary to a 2 month history of poor oral intake.  Creatinine clearance is 31 ml/min.  Plan:   1.  Oncology:  CML chronic phase in molecular remission by last testing at [facility].  Patient has tolerated Gleevec well since diagnosis. Given current renal insufficiency, would hold [place] for now.  Depending on renal function, will start back at full dose (400 mg) or 1/2 dose (200 mg) if CrCl remains 20-39 ml/min.  2.  Hematology:  Hematocrit slightly lower than baseline.  Anemia work-up ordered.  Check BCR-ABL.  INR 1.96 on admission.  3.  Nephrology:  Acute renal insufficiency secondary to poor po intake.  Etiology unclear.  Creatinine improving with hydration (2.21 to 1.65).  Baseline Cr 1.1 -1.3.   Plan: 1. *** 2. *** 3. *** 4. *** 5. ***  Lequita Asal, MD  12/29/2015, 5:51 AM

## 2016-04-01 ENCOUNTER — Ambulatory Visit (INDEPENDENT_AMBULATORY_CARE_PROVIDER_SITE_OTHER): Payer: Medicare Other | Admitting: Ophthalmology

## 2016-06-20 ENCOUNTER — Encounter: Payer: Self-pay | Admitting: Emergency Medicine

## 2016-06-20 ENCOUNTER — Emergency Department
Admission: EM | Admit: 2016-06-20 | Discharge: 2016-06-20 | Disposition: A | Discharge status: Home / Self Care | Payer: Medicare HMO | Attending: Emergency Medicine | Admitting: Emergency Medicine

## 2016-06-20 ENCOUNTER — Emergency Department: Payer: Medicare HMO

## 2016-06-20 DIAGNOSIS — R197 Diarrhea, unspecified: Secondary | ICD-10-CM | POA: Insufficient documentation

## 2016-06-20 DIAGNOSIS — Z7901 Long term (current) use of anticoagulants: Secondary | ICD-10-CM | POA: Insufficient documentation

## 2016-06-20 DIAGNOSIS — I509 Heart failure, unspecified: Secondary | ICD-10-CM | POA: Insufficient documentation

## 2016-06-20 DIAGNOSIS — Z794 Long term (current) use of insulin: Secondary | ICD-10-CM | POA: Insufficient documentation

## 2016-06-20 DIAGNOSIS — R1032 Left lower quadrant pain: Secondary | ICD-10-CM | POA: Insufficient documentation

## 2016-06-20 DIAGNOSIS — E119 Type 2 diabetes mellitus without complications: Secondary | ICD-10-CM | POA: Diagnosis not present

## 2016-06-20 DIAGNOSIS — I110 Hypertensive heart disease with heart failure: Secondary | ICD-10-CM | POA: Insufficient documentation

## 2016-06-20 DIAGNOSIS — R0789 Other chest pain: Secondary | ICD-10-CM | POA: Diagnosis not present

## 2016-06-20 DIAGNOSIS — R1084 Generalized abdominal pain: Secondary | ICD-10-CM | POA: Diagnosis present

## 2016-06-20 DIAGNOSIS — Z87891 Personal history of nicotine dependence: Secondary | ICD-10-CM | POA: Insufficient documentation

## 2016-06-20 DIAGNOSIS — Z7982 Long term (current) use of aspirin: Secondary | ICD-10-CM | POA: Diagnosis not present

## 2016-06-20 DIAGNOSIS — Z79899 Other long term (current) drug therapy: Secondary | ICD-10-CM | POA: Insufficient documentation

## 2016-06-20 LAB — CBC
HEMATOCRIT: 32.5 % — AB (ref 40.0–52.0)
HEMOGLOBIN: 10.7 g/dL — AB (ref 13.0–18.0)
MCH: 26.3 pg (ref 26.0–34.0)
MCHC: 32.8 g/dL (ref 32.0–36.0)
MCV: 80.3 fL (ref 80.0–100.0)
Platelets: 172 10*3/uL (ref 150–440)
RBC: 4.05 MIL/uL — ABNORMAL LOW (ref 4.40–5.90)
RDW: 18.4 % — ABNORMAL HIGH (ref 11.5–14.5)
WBC: 12 10*3/uL — ABNORMAL HIGH (ref 3.8–10.6)

## 2016-06-20 LAB — GASTROINTESTINAL PANEL BY PCR, STOOL (REPLACES STOOL CULTURE)
ASTROVIRUS: NOT DETECTED
Adenovirus F40/41: NOT DETECTED
CRYPTOSPORIDIUM: NOT DETECTED
CYCLOSPORA CAYETANENSIS: NOT DETECTED
Campylobacter species: NOT DETECTED
ENTAMOEBA HISTOLYTICA: NOT DETECTED
ENTEROAGGREGATIVE E COLI (EAEC): NOT DETECTED
ENTEROTOXIGENIC E COLI (ETEC): NOT DETECTED
Enteropathogenic E coli (EPEC): NOT DETECTED
GIARDIA LAMBLIA: NOT DETECTED
NOROVIRUS GI/GII: NOT DETECTED
Plesimonas shigelloides: NOT DETECTED
Rotavirus A: NOT DETECTED
SALMONELLA SPECIES: NOT DETECTED
SAPOVIRUS (I, II, IV, AND V): NOT DETECTED
SHIGA LIKE TOXIN PRODUCING E COLI (STEC): NOT DETECTED
SHIGELLA/ENTEROINVASIVE E COLI (EIEC): NOT DETECTED
VIBRIO CHOLERAE: NOT DETECTED
VIBRIO SPECIES: NOT DETECTED
Yersinia enterocolitica: NOT DETECTED

## 2016-06-20 LAB — BASIC METABOLIC PANEL
ANION GAP: 8 (ref 5–15)
BUN: 33 mg/dL — ABNORMAL HIGH (ref 6–20)
CO2: 17 mmol/L — AB (ref 22–32)
Calcium: 9.4 mg/dL (ref 8.9–10.3)
Chloride: 105 mmol/L (ref 101–111)
Creatinine, Ser: 1.56 mg/dL — ABNORMAL HIGH (ref 0.61–1.24)
GFR calc Af Amer: 47 mL/min — ABNORMAL LOW (ref >60)
GFR calc non Af Amer: 40 mL/min — ABNORMAL LOW (ref >60)
GLUCOSE: 154 mg/dL — AB (ref 65–99)
POTASSIUM: 4.8 mmol/L (ref 3.5–5.1)
Sodium: 130 mmol/L — ABNORMAL LOW (ref 135–145)

## 2016-06-20 LAB — C DIFFICILE QUICK SCREEN W PCR REFLEX
C DIFFICLE (CDIFF) ANTIGEN: NEGATIVE
C Diff interpretation: NOT DETECTED
C Diff toxin: NEGATIVE

## 2016-06-20 LAB — LIPASE, BLOOD: LIPASE: 61 U/L — AB (ref 11–51)

## 2016-06-20 LAB — TROPONIN I: Troponin I: <0.03 ng/mL (ref <0.03)

## 2016-06-20 MED ORDER — MORPHINE SULFATE (PF) 2 MG/ML IV SOLN
INTRAVENOUS | Status: AC
  Administered 2016-06-20: 2 mg via INTRAVENOUS
  Filled 2016-06-20: qty 1

## 2016-06-20 MED ORDER — MORPHINE SULFATE (PF) 2 MG/ML IV SOLN
2.0000 mg | Freq: Once | INTRAVENOUS | Status: AC
  Administered 2016-06-20: 2 mg via INTRAVENOUS

## 2016-06-20 MED ORDER — IOPAMIDOL (ISOVUE-300) INJECTION 61%
30.0000 mL | Freq: Once | INTRAVENOUS | Status: AC | PRN
  Administered 2016-06-20: 30 mL via ORAL

## 2016-06-20 MED ORDER — IOPAMIDOL (ISOVUE-300) INJECTION 61%
75.0000 mL | Freq: Once | INTRAVENOUS | Status: AC | PRN
  Administered 2016-06-20: 75 mL via INTRAVENOUS

## 2016-06-20 MED ORDER — ONDANSETRON HCL 4 MG/2ML IJ SOLN
INTRAMUSCULAR | Status: AC
  Administered 2016-06-20: 4 mg via INTRAVENOUS
  Filled 2016-06-20: qty 2

## 2016-06-20 MED ORDER — ONDANSETRON HCL 4 MG/2ML IJ SOLN
4.0000 mg | Freq: Once | INTRAMUSCULAR | Status: AC
  Administered 2016-06-20: 4 mg via INTRAVENOUS

## 2016-06-20 NOTE — ED Triage Notes (Addendum)
Pt presents to ED with generalized abd pain that radiates into his chest since Wednesday. Also reports diarrhea X3-4 since onset. Pt states he feels "short winded" with slight increased work of breathing noted.  Currently undergoing tx for lukemia at [facility].

## 2016-06-20 NOTE — ED Provider Notes (Signed)
Asked by Dr. Owens Shark to follow-up on second troponin, CT scan, GI panel. All of these are reassuring. Patient reports feeling better just tired. Vital signs are reassuring. Feel the patient is appropriate for discharge at this time. Recommended continuing by mouth hydration, PCP follow-up and if any worsening symptoms to return to the emergency department.   Lavonia Drafts, MD 06/20/16 [phone]

## 2016-06-20 NOTE — ED Notes (Signed)
Attempted IV to right antecub without success.

## 2016-06-20 NOTE — ED Notes (Signed)
Patient transported to CT 

## 2016-06-20 NOTE — ED Provider Notes (Addendum)
[facility] Emergency Department Provider Note   First MD Initiated Contact with Patient 06/20/16 [phone]     (approximate)  I have reviewed the triage vital signs and the nursing notes.   HISTORY  Chief Complaint Chest Pain and Abdominal Pain    HPI Lucas Caldwell is a 83 y.o. male with below list of chronic medical conditions presents to the emergency department with generalized abdominal pain, diarrhea, chest discomfort times one day. Patient also admits to dyspnea. Patient denies any nausea or vomiting or fever. Patient states current pain score is 8 out of 10. Patient denies any alleviating or aggravating factors. Of note the patient states that he is currently being treated at [facility] for leukemia.   Past Medical History:  Diagnosis Date  . Breast asymmetry    Left breast is larger, present for several years.  . Cancer ([place])    Chronic myeloid leukemia  . CHF (congestive heart failure) ([place])   . Diabetes mellitus without complication ([place])   . Gout   . Hyperlipemia   . Hypertension   . Thyroid disease     Patient Active Problem List   Diagnosis Date Noted  . 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 Acute renal failure (ARF) ([place]) 12/08/2015    Past Surgical History:  Procedure Laterality Date  . CARDIAC CATHETERIZATION     2 stents  . HERNIA REPAIR    . TOE AMPUTATION      Prior to Admission medications   Medication Sig Start Date End Date Taking? Authorizing Provider  albuterol (PROVENTIL HFA;VENTOLIN HFA) 108 (90 BASE) MCG/ACT inhaler Inhale 2 puffs into the lungs every 6 (six) hours as needed for wheezing or shortness of breath. 11/04/14   Ahmed Prima, MD  allopurinol (ZYLOPRIM) 300 MG tablet Take 300 mg by mouth every evening. 11/10/15   Historical Provider, MD  aspirin EC 81 MG tablet Take 81 mg by mouth every morning. 03/05/12   Historical Provider, MD  atropine 1 % ophthalmic solution Place 1 drop into the right  eye at bedtime.    Historical Provider, MD  digoxin (LANOXIN) 0.125 MG tablet Take 125 mcg by mouth at bedtime. 11/10/15   Historical Provider, MD  enalapril (VASOTEC) 5 MG tablet Take 5 mg by mouth 2 (two) times daily. 11/22/15   Historical Provider, MD  eplerenone (INSPRA) 25 MG tablet Take 25 mg by mouth every evening. 11/10/15   Historical Provider, MD  furosemide (LASIX) 40 MG tablet Take 1 tablet by mouth See admin instructions. Take 40 mg by mouth every morning and take 40 mg by mouth at lunchtime as needed for swelling or fluid retention. 11/10/15   Historical Provider, MD  LANTUS SOLOSTAR 100 UNIT/ML Solostar Pen Inject 20 Units into the skin at bedtime. 12/11/15   Demetrios Loll, MD  levothyroxine (SYNTHROID, LEVOTHROID) 50 MCG tablet Take 50 mcg by mouth daily before breakfast. 10/27/15   Historical Provider, MD  metoprolol succinate (TOPROL-XL) 50 MG 24 hr tablet Take 50 mg by mouth every morning. 12/04/15   Historical Provider, MD  mirtazapine (REMERON) 15 MG tablet Take 15 mg by mouth at bedtime. 11/02/15   Historical Provider, MD  Multiple Vitamins-Minerals (MULTIVITAMIN ADULTS 50+ PO) Take 1 tablet by mouth every morning.    Historical Provider, MD  omeprazole (PRILOSEC) 40 MG capsule Take 40 mg by mouth every morning. 11/10/15   Historical Provider, MD  oxyCODONE (OXY IR/ROXICODONE) 5 MG immediate release tablet Take 5 mg  by mouth every 6 (six) hours as needed for pain. 10/25/15   Historical Provider, MD  simvastatin (ZOCOR) 20 MG tablet Take 20 mg by mouth every evening. 08/30/11   Historical Provider, MD  Spacer/Aero-Holding [facility] Lobo [facility] ADVANTAGE) MISC 1 each by Other route once. 11/04/14   Ahmed Prima, MD  SPIRIVA HANDIHALER 18 MCG inhalation capsule Place 18 mcg into inhaler and inhale every morning. 12/04/15   Historical Provider, MD  warfarin (COUMADIN) 2 MG tablet Take 4 mg by mouth at bedtime. 12/04/15   Historical Provider, MD    Allergies Clopidogrel; Ciprofloxacin; Lyrica  [pregabalin]; Spironolactone; and Plavix [clopidogrel bisulfate]  History reviewed. No pertinent family history.  Social History Social History  Substance Use Topics  . Smoking status: Former Research scientist (life sciences)  . Smokeless tobacco: Never Used  . Alcohol use No    Review of Systems Constitutional: No fever/chills Eyes: No visual changes. ENT: No sore throat. Cardiovascular: Positive for chest pain. Respiratory: Denies shortness of breath. Gastrointestinal: Positive for abdominal pain and diarrhea  Genitourinary: Negative for dysuria. Musculoskeletal: Negative for back pain. Skin: Negative for rash. Neurological: Negative for headaches, focal weakness or numbness.  10-point ROS otherwise negative.  ____________________________________________   PHYSICAL EXAM:  VITAL SIGNS: ED Triage Vitals  Enc Vitals Group     BP 06/20/16 0317 (!) 144/71     Pulse Rate 06/20/16 0317 79     Resp 06/20/16 0317 18     Temp 06/20/16 0317 98.3 F (36.8 C)     Temp Source 06/20/16 0317 Oral     SpO2 06/20/16 0317 97 %     Weight 06/20/16 0318 200 lb (90.7 kg)     Height 06/20/16 0318 5\' 4"  (1.626 m)     Head Circumference --      Peak Flow --      Pain Score 06/20/16 0319 8     Pain Loc --      Pain Edu? --      Excl. in [place]? --     Constitutional: Alert and oriented. Well appearing and in no acute distress. Eyes: Conjunctivae are normal. PERRL. EOMI. Head: Atraumatic. Mouth/Throat: Mucous membranes are moist.  Oropharynx non-erythematous. Neck: No stridor.   Cardiovascular: Normal rate, regular rhythm. Good peripheral circulation. Grossly normal heart sounds. Respiratory: Normal respiratory effort.  No retractions. Lungs CTAB. Gastrointestinal: Left lower quadrant tenderness to palpation No distention.  Musculoskeletal: No lower extremity tenderness nor edema. No gross deformities of extremities. Neurologic:  Normal speech and language. No gross focal neurologic deficits are appreciated.    Skin:  Skin is warm, dry and intact. No rash noted. Psychiatric: Mood and affect are normal. Speech and behavior are normal.  ____________________________________________   LABS (all labs ordered are listed, but only abnormal results are displayed)  Labs Reviewed  BASIC METABOLIC PANEL - Abnormal; Notable for the following:       Result Value   Sodium 130 (*)    CO2 17 (*)    Glucose, Bld 154 (*)    BUN 33 (*)    Creatinine, Ser 1.56 (*)    GFR calc non Af Amer 40 (*)    GFR calc Af Amer 47 (*)    All other components within normal limits  CBC - Abnormal; Notable for the following:    WBC 12.0 (*)    RBC 4.05 (*)    Hemoglobin 10.7 (*)    HCT 32.5 (*)    RDW 18.4 (*)    All  other components within normal limits  LIPASE, BLOOD - Abnormal; Notable for the following:    Lipase 61 (*)    All other components within normal limits  C DIFFICILE QUICK SCREEN W PCR REFLEX  GASTROINTESTINAL PANEL BY PCR, STOOL (REPLACES STOOL CULTURE)  TROPONIN I  TROPONIN I   ____________________________________________  EKG  ED ECG REPORT I, [place] N BROWN, the attending physician, personally viewed and interpreted this ECG.   Date: 06/20/2016  EKG Time: 3:14 AM  Rate: 78  Rhythm: Normal sinus rhythm with right bundle branch block  Axis: Axis deviation  Intervals: Normal  ST&T Change: None  ____________________________________________  RADIOLOGY I, [place] N BROWN, personally viewed and evaluated these images (plain radiographs) as part of my medical decision making, as well as reviewing the written report by the radiologist.  Ct Abdomen Pelvis W Contrast  Result Date: 06/20/2016 CLINICAL DATA:  Generalized abdominal pain since yesterday. Diarrhea. Being treated for leukemia. EXAM: CT ABDOMEN AND PELVIS WITH CONTRAST TECHNIQUE: Multidetector CT imaging of the abdomen and pelvis was performed using the standard protocol following bolus administration of intravenous contrast.  CONTRAST:  14mL ISOVUE-300 IOPAMIDOL (ISOVUE-300) INJECTION 61% COMPARISON:  05/20/2014 FINDINGS: Lower chest: Normal except for coronary calcification or stents. Hepatobiliary: No liver parenchymal abnormality. No calcified gallstones. Pancreas: Normal Spleen: Normal Adrenals/Urinary Tract: Adrenal glands are normal. Kidneys are normal. No cyst, mass, stone or hydronephrosis. Stomach/Bowel: Liquid stool in the colon. No evidence of visible colitis. Small bowel appears normal without obstruction or wall thickening. Patient does have some sigmoid diverticulosis without visible diverticulitis. Vascular/Lymphatic: Aortic atherosclerosis. No aneurysm. IVC is normal. No retroperitoneal mass or lymphadenopathy. Reproductive: Normal Other: No free fluid or air. Musculoskeletal: Ordinary lower lumbar degenerative changes. IMPRESSION: No specific finding by CT. The patient does appear to have liquid stool. Diverticulosis of the sigmoid colon without diagnose wall diverticulitis. Low level diverticulitis can be inapparent at imaging. Aortic atherosclerosis. Electronically Signed   By: Nelson Chimes M.D.   On: 06/20/2016 07:15   Dg Chest Portable 1 View  Result Date: 06/20/2016 CLINICAL DATA:  Generalized abdominal pain extending into the chest for 1 day. EXAM: PORTABLE CHEST 1 VIEW COMPARISON:  12/08/2015 FINDINGS: AP portable views of the chest demonstrates no focal airspace consolidation or alveolar edema. The lungs are grossly clear. There is no large effusion or pneumothorax. Cardiac and mediastinal contours appear unremarkable. IMPRESSION: No active disease. Electronically Signed   By: Andreas Newport M.D.   On: 06/20/2016 05:38      Procedures   ____________________________________________   INITIAL IMPRESSION / ASSESSMENT AND PLAN / ED COURSE  Pertinent labs & imaging results that were available during my care of the patient were reviewed by me and considered in my medical decision making (see chart  for details).   83 year old male presenting to the emergency department with generalized abdominal pain with left lower quadrant tenderness to palpation. Patient admits to nonbloody diarrhea. Concern for potential infectious etiology versus diverticulitis as etiology for the patient's diarrhea. CT scan of the abdomen and pelvis pending as well as stool studies. Will repeat the patient's troponin. I anticipate that the patient will be able to be discharged home if repeat troponin is negative and stool study/CT scan reveal infectious etiology for the patient's diarrhea and abdominal discomfort. Patient's care transferred to Dr. Corky Downs      ____________________________________________  FINAL CLINICAL IMPRESSION(S) / ED DIAGNOSES  Final diagnoses:  Diarrhea, unspecified type  Left lower quadrant pain     MEDICATIONS  GIVEN DURING THIS VISIT:  Medications  morphine 2 MG/ML injection 2 mg (2 mg Intravenous Given 06/20/16 0401)  ondansetron (ZOFRAN) injection 4 mg (4 mg Intravenous Given 06/20/16 0401)  iopamidol (ISOVUE-300) 61 % injection 30 mL (30 mLs Oral Contrast Given 06/20/16 0435)  iopamidol (ISOVUE-300) 61 % injection 75 mL (75 mLs Intravenous Contrast Given 06/20/16 0650)     NEW OUTPATIENT MEDICATIONS STARTED DURING THIS VISIT:  New Prescriptions   No medications on file    Modified Medications   No medications on file    Discontinued Medications   No medications on file     Note:  This document was prepared using Dragon voice recognition software and may include unintentional dictation errors.    Gregor Hams, MD 06/20/16 [place], MD 06/20/16 [phone]

## 2016-07-20 ENCOUNTER — Emergency Department
Admission: EM | Admit: 2016-07-20 | Discharge: 2016-07-20 | Disposition: A | Discharge status: Home / Self Care | Payer: Medicare HMO | Attending: Emergency Medicine | Admitting: Emergency Medicine

## 2016-07-20 ENCOUNTER — Encounter: Payer: Self-pay | Admitting: Emergency Medicine

## 2016-07-20 DIAGNOSIS — Z7901 Long term (current) use of anticoagulants: Secondary | ICD-10-CM | POA: Insufficient documentation

## 2016-07-20 DIAGNOSIS — Z87891 Personal history of nicotine dependence: Secondary | ICD-10-CM | POA: Diagnosis not present

## 2016-07-20 DIAGNOSIS — W57XXXD Bitten or stung by nonvenomous insect and other nonvenomous arthropods, subsequent encounter: Secondary | ICD-10-CM | POA: Insufficient documentation

## 2016-07-20 DIAGNOSIS — I110 Hypertensive heart disease with heart failure: Secondary | ICD-10-CM | POA: Diagnosis not present

## 2016-07-20 DIAGNOSIS — L03313 Cellulitis of chest wall: Secondary | ICD-10-CM | POA: Diagnosis not present

## 2016-07-20 DIAGNOSIS — Z794 Long term (current) use of insulin: Secondary | ICD-10-CM | POA: Insufficient documentation

## 2016-07-20 DIAGNOSIS — I509 Heart failure, unspecified: Secondary | ICD-10-CM | POA: Diagnosis not present

## 2016-07-20 DIAGNOSIS — Z79899 Other long term (current) drug therapy: Secondary | ICD-10-CM | POA: Insufficient documentation

## 2016-07-20 DIAGNOSIS — Z7982 Long term (current) use of aspirin: Secondary | ICD-10-CM | POA: Insufficient documentation

## 2016-07-20 DIAGNOSIS — E119 Type 2 diabetes mellitus without complications: Secondary | ICD-10-CM | POA: Insufficient documentation

## 2016-07-20 DIAGNOSIS — S20369D Insect bite (nonvenomous) of unspecified front wall of thorax, subsequent encounter: Secondary | ICD-10-CM | POA: Diagnosis present

## 2016-07-20 DIAGNOSIS — Z853 Personal history of malignant neoplasm of breast: Secondary | ICD-10-CM | POA: Insufficient documentation

## 2016-07-20 MED ORDER — DOXYCYCLINE HYCLATE 100 MG PO CAPS: 100.0000 mg | ORAL_CAPSULE | Freq: Two times a day (BID) | ORAL | 0 refills | Status: DC

## 2016-07-20 MED ORDER — HYDROCORTISONE 2.5 % EX OINT: TOPICAL_OINTMENT | Freq: Three times a day (TID) | CUTANEOUS | 0 refills | Status: DC

## 2016-07-20 NOTE — ED Triage Notes (Signed)
Patient presents to the ED with a reddened area to his left chest.  Patient states his wife removed the tick on Thursday with tweezers and then cleansed area with alcohol.  Patient states tick was very small.  Patient states, "I don't know if maybe the head is still in there or if it's infected or what."  Patient denies pain.  Denies fever.  No obvious distress at this time.

## 2016-07-20 NOTE — ED Notes (Signed)
AAOx3.  Skin warm and dry.  NAD 

## 2016-07-20 NOTE — ED Provider Notes (Signed)
[facility] Emergency Department Provider Note  ____________________________________________  Time seen: Approximately 2:27 PM  I have reviewed the triage vital signs and the nursing notes.   HISTORY  Chief Complaint Wound Check   HPI Lucas Caldwell is a 83 y.o. male who presents to the emergency department for evaluation of redness to the chest wall from the site of a tick bite. Tick was removed Wednesday. Site itches and redness has spread. He denies significant pain.   Past Medical History:  Diagnosis Date  . Breast asymmetry    Left breast is larger, present for several years.  . Cancer ([place])    Chronic myeloid leukemia  . CHF (congestive heart failure) ([place])   . Diabetes mellitus without complication ([place])   . Gout   . Hyperlipemia   . Hypertension   . Thyroid disease     Patient Active Problem List   Diagnosis Date Noted  . CML (chronic myelocytic leukemia) ([place]) 12/29/2015  . Acute renal failure (ARF) ([place]) 12/08/2015    Past Surgical History:  Procedure Laterality Date  . CARDIAC CATHETERIZATION     2 stents  . HERNIA REPAIR    . TOE AMPUTATION      Prior to Admission medications   Medication Sig Start Date End Date Taking? Authorizing Provider  albuterol (PROVENTIL HFA;VENTOLIN HFA) 108 (90 BASE) MCG/ACT inhaler Inhale 2 puffs into the lungs every 6 (six) hours as needed for wheezing or shortness of breath. 11/04/14   Ahmed Prima, MD  allopurinol (ZYLOPRIM) 300 MG tablet Take 300 mg by mouth every evening. 11/10/15   [provider]  aspirin EC 81 MG tablet Take 81 mg by mouth every morning. 03/05/12   [provider]  atropine 1 % ophthalmic solution Place 1 drop into the right eye at bedtime.    [provider]  digoxin (LANOXIN) 0.125 MG tablet Take 125 mcg by mouth at bedtime. 11/10/15   [provider]  doxycycline (VIBRAMYCIN) 100 MG capsule Take 1 capsule (100 mg total) by mouth 2  (two) times daily. 07/20/16   Minha Fulco B, FNP  enalapril (VASOTEC) 5 MG tablet Take 5 mg by mouth 2 (two) times daily. 11/22/15   [provider]  eplerenone (INSPRA) 25 MG tablet Take 25 mg by mouth every evening. 11/10/15   [provider]  furosemide (LASIX) 40 MG tablet Take 1 tablet by mouth See admin instructions. Take 40 mg by mouth every morning and take 40 mg by mouth at lunchtime as needed for swelling or fluid retention. 11/10/15   [provider]  hydrocortisone 2.5 % ointment Apply topically 3 (three) times daily. 07/20/16   Nianna Igo B, FNP  LANTUS SOLOSTAR 100 UNIT/ML Solostar Pen Inject 20 Units into the skin at bedtime. 12/11/15   Demetrios Loll, MD  levothyroxine (SYNTHROID, LEVOTHROID) 50 MCG tablet Take 50 mcg by mouth daily before breakfast. 10/27/15   [provider]  metoprolol succinate (TOPROL-XL) 50 MG 24 hr tablet Take 50 mg by mouth every morning. 12/04/15   [provider]  mirtazapine (REMERON) 15 MG tablet Take 15 mg by mouth at bedtime. 11/02/15   [provider]  Multiple Vitamins-Minerals (MULTIVITAMIN ADULTS 50+ PO) Take 1 tablet by mouth every morning.    [provider]  omeprazole (PRILOSEC) 40 MG capsule Take 40 mg by mouth every morning. 11/10/15   [provider]  oxyCODONE (OXY IR/ROXICODONE) 5 MG immediate release tablet Take 5 mg by  mouth every 6 (six) hours as needed for pain. 10/25/15   [provider]  simvastatin (ZOCOR) 20 MG tablet Take 20 mg by mouth every evening. 08/30/11   [provider]  Spacer/Aero-Holding [facility]obo [facility] ADVANTAGE) [place] 1 each by Other route once. 11/04/14   Ahmed Prima, MD  SPIRIVA HANDIHALER 18 MCG inhalation capsule Place 18 mcg into inhaler and inhale every morning. 12/04/15   [provider]  warfarin (COUMADIN) 2 MG tablet Take 4 mg by mouth at bedtime. 12/04/15   [provider]    Allergies Clopidogrel;  Ciprofloxacin; Lyrica [pregabalin]; Spironolactone; and Plavix [clopidogrel bisulfate]  No family history on file.  Social History Social History  Substance Use Topics  . Smoking status: Former Research scientist (life sciences)  . Smokeless tobacco: Never Used  . Alcohol use No    Review of Systems  Constitutional: Negative for fever/chills Respiratory: Negative for shortness of breath. Musculoskeletal: Negative for pain. Skin: Positive for lesion and redness. Neurological: Negative for headaches, focal weakness or numbness. ____________________________________________   PHYSICAL EXAM:  VITAL SIGNS: ED Triage Vitals [07/20/16 1400]  Enc Vitals Group     BP (!) 138/54     Pulse Rate 61     Resp 16     Temp 97.9 F (36.6 C)     Temp Source Oral     SpO2 100 %     Weight 201 lb (91.2 kg)     Height 5\' 4"  (1.626 m)     Head Circumference      Peak Flow      Pain Score 0     Pain Loc      Pain Edu?      Excl. in [place]?      Constitutional: Alert and oriented. Well appearing and in no acute distress. Eyes: Conjunctivae are normal. EOMI. Nose: No congestion/rhinnorhea. Mouth/Throat: Mucous membranes are moist.   Neck: No stridor. Cardiovascular: Good peripheral circulation. Respiratory: Normal respiratory effort.  No retractions. Musculoskeletal: FROM throughout. Neurologic:  Normal speech and language. No gross focal neurologic deficits are appreciated. Skin:  Central healing lesion consistent with tick removal. Surrounding area is indurated and erythematous.   ____________________________________________   LABS (all labs ordered are listed, but only abnormal results are displayed)  Labs Reviewed - No data to display ____________________________________________  EKG   ____________________________________________  RADIOLOGY  Not indicated. ____________________________________________   PROCEDURES  Procedure(s) performed:  None ____________________________________________   INITIAL IMPRESSION / ASSESSMENT AND PLAN / ED COURSE  83 year old male presenting to the emergency department for evaluation of lesion and pruritic area on his chest wall after removing a tick 3 days ago. The area appears to be early cellulitis and will be treated with doxycycline and hydrocortisone cream. He was advised to contact his primary care provider to schedule a recheck of his INR and wound recheck. Return precautions also discussed.  Pertinent labs & imaging results that were available during my care of the patient were reviewed by me and considered in my medical decision making (see chart for details).   ____________________________________________   FINAL CLINICAL IMPRESSION(S) / ED DIAGNOSES  Final diagnoses:  Cellulitis of chest wall    Discharge Medication List as of 07/20/2016  2:39 PM    START taking these medications   Details  doxycycline (VIBRAMYCIN) 100 MG capsule Take 1 capsule (100 mg total) by mouth 2 (two) times daily., Starting Sat 07/20/2016, Print    hydrocortisone 2.5 % ointment Apply topically 3 (three) times daily., Starting  Sat 07/20/2016, Print        If controlled substance prescribed during this visit, 12 month history viewed on the [place] prior to issuing an initial prescription for Schedule II or III opiod.   Note:  This document was prepared using Dragon voice recognition software and may include unintentional dictation errors.    Victorino Dike, FNP 07/20/16 1526    Harvest Dark, MD 07/20/16 [phone]

## 2016-07-20 NOTE — Discharge Instructions (Signed)
Please call and schedule a follow up with your doctor to have your coumadin level rechecked since you are on an antibiotic.   Return to the ER for symptoms that change or worsen or for new concerns.

## 2016-08-12 ENCOUNTER — Observation Stay
Admission: EM | Admit: 2016-08-12 | Discharge: 2016-08-15 | Payer: Medicare HMO | Attending: Internal Medicine | Admitting: Internal Medicine

## 2016-08-12 ENCOUNTER — Emergency Department: Payer: Medicare HMO

## 2016-08-12 ENCOUNTER — Observation Stay (HOSPITAL_BASED_OUTPATIENT_CLINIC_OR_DEPARTMENT_OTHER)
Admit: 2016-08-12 | Discharge: 2016-08-12 | Disposition: A | Discharge status: Home / Self Care | Payer: Medicare HMO | Attending: Cardiovascular Disease | Admitting: Cardiovascular Disease

## 2016-08-12 ENCOUNTER — Encounter: Payer: Self-pay | Admitting: Emergency Medicine

## 2016-08-12 DIAGNOSIS — I5021 Acute systolic (congestive) heart failure: Secondary | ICD-10-CM

## 2016-08-12 DIAGNOSIS — M109 Gout, unspecified: Secondary | ICD-10-CM | POA: Insufficient documentation

## 2016-08-12 DIAGNOSIS — Z7901 Long term (current) use of anticoagulants: Secondary | ICD-10-CM | POA: Diagnosis not present

## 2016-08-12 DIAGNOSIS — I2584 Coronary atherosclerosis due to calcified coronary lesion: Secondary | ICD-10-CM | POA: Insufficient documentation

## 2016-08-12 DIAGNOSIS — I4510 Unspecified right bundle-branch block: Secondary | ICD-10-CM | POA: Insufficient documentation

## 2016-08-12 DIAGNOSIS — I440 Atrioventricular block, first degree: Secondary | ICD-10-CM | POA: Insufficient documentation

## 2016-08-12 DIAGNOSIS — I429 Cardiomyopathy, unspecified: Secondary | ICD-10-CM | POA: Diagnosis not present

## 2016-08-12 DIAGNOSIS — E785 Hyperlipidemia, unspecified: Secondary | ICD-10-CM | POA: Insufficient documentation

## 2016-08-12 DIAGNOSIS — C9210 Chronic myeloid leukemia, BCR/ABL-positive, not having achieved remission: Secondary | ICD-10-CM | POA: Insufficient documentation

## 2016-08-12 DIAGNOSIS — I25110 Atherosclerotic heart disease of native coronary artery with unstable angina pectoris: Principal | ICD-10-CM | POA: Insufficient documentation

## 2016-08-12 DIAGNOSIS — I208 Other forms of angina pectoris: Secondary | ICD-10-CM | POA: Diagnosis present

## 2016-08-12 DIAGNOSIS — I480 Paroxysmal atrial fibrillation: Secondary | ICD-10-CM | POA: Insufficient documentation

## 2016-08-12 DIAGNOSIS — N183 Chronic kidney disease, stage 3 (moderate): Secondary | ICD-10-CM | POA: Insufficient documentation

## 2016-08-12 DIAGNOSIS — Z87891 Personal history of nicotine dependence: Secondary | ICD-10-CM | POA: Diagnosis not present

## 2016-08-12 DIAGNOSIS — E039 Hypothyroidism, unspecified: Secondary | ICD-10-CM | POA: Insufficient documentation

## 2016-08-12 DIAGNOSIS — Z794 Long term (current) use of insulin: Secondary | ICD-10-CM | POA: Diagnosis not present

## 2016-08-12 DIAGNOSIS — I5043 Acute on chronic combined systolic (congestive) and diastolic (congestive) heart failure: Secondary | ICD-10-CM | POA: Insufficient documentation

## 2016-08-12 DIAGNOSIS — Z7982 Long term (current) use of aspirin: Secondary | ICD-10-CM | POA: Insufficient documentation

## 2016-08-12 DIAGNOSIS — E1122 Type 2 diabetes mellitus with diabetic chronic kidney disease: Secondary | ICD-10-CM | POA: Insufficient documentation

## 2016-08-12 DIAGNOSIS — R0600 Dyspnea, unspecified: Secondary | ICD-10-CM | POA: Diagnosis not present

## 2016-08-12 DIAGNOSIS — J45909 Unspecified asthma, uncomplicated: Secondary | ICD-10-CM | POA: Insufficient documentation

## 2016-08-12 DIAGNOSIS — I130 Hypertensive heart and chronic kidney disease with heart failure and stage 1 through stage 4 chronic kidney disease, or unspecified chronic kidney disease: Secondary | ICD-10-CM | POA: Insufficient documentation

## 2016-08-12 DIAGNOSIS — R0609 Other forms of dyspnea: Secondary | ICD-10-CM | POA: Diagnosis present

## 2016-08-12 DIAGNOSIS — N400 Enlarged prostate without lower urinary tract symptoms: Secondary | ICD-10-CM | POA: Diagnosis not present

## 2016-08-12 DIAGNOSIS — Z955 Presence of coronary angioplasty implant and graft: Secondary | ICD-10-CM | POA: Insufficient documentation

## 2016-08-12 DIAGNOSIS — R0602 Shortness of breath: Secondary | ICD-10-CM | POA: Diagnosis present

## 2016-08-12 HISTORY — DX: Iron deficiency anemia, unspecified: D50.9

## 2016-08-12 HISTORY — DX: Unspecified right bundle-branch block: I45.10

## 2016-08-12 HISTORY — DX: Paroxysmal atrial fibrillation: I48.0

## 2016-08-12 HISTORY — DX: Atherosclerotic heart disease of native coronary artery without angina pectoris: I25.10

## 2016-08-12 HISTORY — DX: Benign prostatic hyperplasia without lower urinary tract symptoms: N40.0

## 2016-08-12 HISTORY — DX: Hypothyroidism, unspecified: E03.9

## 2016-08-12 HISTORY — DX: Other cardiomyopathies: I42.8

## 2016-08-12 HISTORY — DX: Chronic myeloid leukemia, BCR/ABL-positive, not having achieved remission: C92.10

## 2016-08-12 HISTORY — DX: Chronic combined systolic (congestive) and diastolic (congestive) heart failure: I50.42

## 2016-08-12 LAB — GLUCOSE, CAPILLARY
GLUCOSE-CAPILLARY: 174 mg/dL — AB (ref 65–99)
Glucose-Capillary: 137 mg/dL — ABNORMAL HIGH (ref 65–99)
Glucose-Capillary: 196 mg/dL — ABNORMAL HIGH (ref 65–99)

## 2016-08-12 LAB — ECHOCARDIOGRAM COMPLETE
HEIGHTINCHES: 64 in
Weight: 3264 oz

## 2016-08-12 LAB — BASIC METABOLIC PANEL
Anion gap: 7 (ref 5–15)
BUN: 21 mg/dL — AB (ref 6–20)
CALCIUM: 9.4 mg/dL (ref 8.9–10.3)
CO2: 23 mmol/L (ref 22–32)
CREATININE: 1.28 mg/dL — AB (ref 0.61–1.24)
Chloride: 105 mmol/L (ref 101–111)
GFR calc Af Amer: 59 mL/min — ABNORMAL LOW (ref >60)
GFR, EST NON AFRICAN AMERICAN: 51 mL/min — AB (ref >60)
Glucose, Bld: 186 mg/dL — ABNORMAL HIGH (ref 65–99)
POTASSIUM: 4.2 mmol/L (ref 3.5–5.1)
SODIUM: 135 mmol/L (ref 135–145)

## 2016-08-12 LAB — CBC
HCT: 30 % — ABNORMAL LOW (ref 40.0–52.0)
Hemoglobin: 10 g/dL — ABNORMAL LOW (ref 13.0–18.0)
MCH: 26.2 pg (ref 26.0–34.0)
MCHC: 33.2 g/dL (ref 32.0–36.0)
MCV: 79 fL — ABNORMAL LOW (ref 80.0–100.0)
PLATELETS: 170 10*3/uL (ref 150–440)
RBC: 3.8 MIL/uL — ABNORMAL LOW (ref 4.40–5.90)
RDW: 17.4 % — AB (ref 11.5–14.5)
WBC: 9.6 10*3/uL (ref 3.8–10.6)

## 2016-08-12 LAB — PROTIME-INR
INR: 1.77
PROTHROMBIN TIME: 20.8 s — AB (ref 11.4–15.2)

## 2016-08-12 LAB — TROPONIN I

## 2016-08-12 LAB — BRAIN NATRIURETIC PEPTIDE: B NATRIURETIC PEPTIDE 5: 95 pg/mL (ref 0.0–100.0)

## 2016-08-12 MED ORDER — SODIUM CHLORIDE 0.9% FLUSH
3.0000 mL | Freq: Two times a day (BID) | INTRAVENOUS | Status: DC
  Administered 2016-08-12 – 2016-08-15 (×7): 3 mL via INTRAVENOUS

## 2016-08-12 MED ORDER — FUROSEMIDE 20 MG PO TABS
20.0000 mg | ORAL_TABLET | Freq: Every day | ORAL | Status: DC
  Administered 2016-08-13 – 2016-08-14 (×2): 20 mg via ORAL
  Filled 2016-08-12 (×2): qty 1

## 2016-08-12 MED ORDER — ACETAMINOPHEN 500 MG PO TABS: 500.0000 mg | ORAL_TABLET | Freq: Four times a day (QID) | ORAL | Status: DC | PRN

## 2016-08-12 MED ORDER — INSULIN ASPART 100 UNIT/ML ~~LOC~~ SOLN
0.0000 [IU] | Freq: Three times a day (TID) | SUBCUTANEOUS | Status: DC
  Administered 2016-08-12 – 2016-08-14 (×5): 2 [IU] via SUBCUTANEOUS
  Administered 2016-08-15 (×2): 3 [IU] via SUBCUTANEOUS
  Filled 2016-08-12: qty 3
  Filled 2016-08-12 (×5): qty 2
  Filled 2016-08-12: qty 3

## 2016-08-12 MED ORDER — ADULT MULTIVITAMIN W/MINERALS CH
ORAL_TABLET | ORAL | Status: DC
  Administered 2016-08-13 – 2016-08-15 (×3): 1 via ORAL
  Filled 2016-08-12 (×3): qty 1

## 2016-08-12 MED ORDER — LEVOTHYROXINE SODIUM 25 MCG PO TABS
25.0000 ug | ORAL_TABLET | Freq: Every day | ORAL | Status: DC
  Administered 2016-08-13 – 2016-08-15 (×3): 25 ug via ORAL
  Filled 2016-08-12 (×3): qty 1

## 2016-08-12 MED ORDER — BOSUTINIB 100 MG PO TABS
200.0000 mg | ORAL_TABLET | Freq: Every day | ORAL | Status: DC
  Filled 2016-08-12 (×2): qty 2

## 2016-08-12 MED ORDER — MIRTAZAPINE 15 MG PO TABS
15.0000 mg | ORAL_TABLET | Freq: Every day | ORAL | Status: DC
  Administered 2016-08-12 – 2016-08-14 (×3): 15 mg via ORAL
  Filled 2016-08-12 (×3): qty 1

## 2016-08-12 MED ORDER — ALLOPURINOL 100 MG PO TABS
300.0000 mg | ORAL_TABLET | Freq: Every day | ORAL | Status: DC
  Administered 2016-08-13 – 2016-08-15 (×3): 300 mg via ORAL
  Filled 2016-08-12 (×3): qty 3

## 2016-08-12 MED ORDER — SIMVASTATIN 20 MG PO TABS
20.0000 mg | ORAL_TABLET | Freq: Every evening | ORAL | Status: DC
  Administered 2016-08-12 – 2016-08-13 (×2): 20 mg via ORAL
  Filled 2016-08-12 (×2): qty 1

## 2016-08-12 MED ORDER — ASPIRIN EC 81 MG PO TBEC
81.0000 mg | DELAYED_RELEASE_TABLET | ORAL | Status: DC
  Administered 2016-08-13 – 2016-08-15 (×2): 81 mg via ORAL
  Filled 2016-08-12 (×3): qty 1

## 2016-08-12 MED ORDER — FUROSEMIDE 10 MG/ML IJ SOLN
40.0000 mg | Freq: Once | INTRAMUSCULAR | Status: AC
  Administered 2016-08-12: 40 mg via INTRAVENOUS
  Filled 2016-08-12: qty 4

## 2016-08-12 MED ORDER — OPTICHAMBER ADVANTAGE MISC: 1.0000 | Freq: Once | Status: DC

## 2016-08-12 MED ORDER — PANTOPRAZOLE SODIUM 40 MG PO TBEC
40.0000 mg | DELAYED_RELEASE_TABLET | Freq: Every day | ORAL | Status: DC
  Administered 2016-08-13 – 2016-08-15 (×3): 40 mg via ORAL
  Filled 2016-08-12 (×3): qty 1

## 2016-08-12 MED ORDER — BOSUTINIB 100 MG PO TABS
200.0000 mg | ORAL_TABLET | Freq: Every day | ORAL | Status: DC
  Administered 2016-08-12 – 2016-08-13 (×2): 200 mg via ORAL
  Filled 2016-08-12 (×2): qty 2

## 2016-08-12 MED ORDER — FAMOTIDINE 20 MG PO TABS
20.0000 mg | ORAL_TABLET | Freq: Every day | ORAL | Status: DC
  Administered 2016-08-12 – 2016-08-14 (×3): 20 mg via ORAL
  Filled 2016-08-12 (×3): qty 1

## 2016-08-12 MED ORDER — INSULIN GLARGINE 100 UNIT/ML ~~LOC~~ SOLN
50.0000 [IU] | Freq: Every day | SUBCUTANEOUS | Status: DC
  Administered 2016-08-12 – 2016-08-13 (×2): 50 [IU] via SUBCUTANEOUS
  Filled 2016-08-12 (×4): qty 0.5

## 2016-08-12 MED ORDER — SPIRONOLACTONE 25 MG PO TABS
25.0000 mg | ORAL_TABLET | Freq: Every day | ORAL | Status: DC
  Administered 2016-08-13: 25 mg via ORAL
  Filled 2016-08-12: qty 1

## 2016-08-12 MED ORDER — ENALAPRIL MALEATE 5 MG PO TABS
5.0000 mg | ORAL_TABLET | Freq: Every day | ORAL | Status: DC
  Administered 2016-08-13 – 2016-08-14 (×2): 5 mg via ORAL
  Filled 2016-08-12 (×2): qty 1

## 2016-08-12 MED ORDER — MAGNESIUM OXIDE 400 (241.3 MG) MG PO TABS
400.0000 mg | ORAL_TABLET | Freq: Two times a day (BID) | ORAL | Status: DC
  Administered 2016-08-12 – 2016-08-15 (×6): 400 mg via ORAL
  Filled 2016-08-12 (×6): qty 1

## 2016-08-12 MED ORDER — ASPIRIN EC 81 MG PO TBEC: 81.0000 mg | DELAYED_RELEASE_TABLET | Freq: Every day | ORAL | Status: DC

## 2016-08-12 MED ORDER — METOPROLOL SUCCINATE ER 50 MG PO TB24
50.0000 mg | ORAL_TABLET | ORAL | Status: DC
  Administered 2016-08-13 – 2016-08-15 (×3): 50 mg via ORAL
  Filled 2016-08-12 (×3): qty 1

## 2016-08-12 MED ORDER — ALBUTEROL SULFATE (2.5 MG/3ML) 0.083% IN NEBU: 2.5000 mg | INHALATION_SOLUTION | Freq: Four times a day (QID) | RESPIRATORY_TRACT | Status: DC | PRN

## 2016-08-12 MED ORDER — LOPERAMIDE HCL 2 MG PO CAPS: 2.0000 mg | ORAL_CAPSULE | Freq: Four times a day (QID) | ORAL | Status: DC | PRN

## 2016-08-12 MED ORDER — PREGABALIN 50 MG PO CAPS
50.0000 mg | ORAL_CAPSULE | Freq: Three times a day (TID) | ORAL | Status: DC
  Administered 2016-08-12 – 2016-08-15 (×8): 50 mg via ORAL
  Filled 2016-08-12 (×8): qty 1

## 2016-08-12 MED ORDER — INSULIN ASPART 100 UNIT/ML ~~LOC~~ SOLN
0.0000 [IU] | Freq: Every day | SUBCUTANEOUS | Status: DC
  Administered 2016-08-14: 4 [IU] via SUBCUTANEOUS
  Filled 2016-08-12: qty 4

## 2016-08-12 MED ORDER — WARFARIN - PHYSICIAN DOSING INPATIENT: Freq: Every day | Status: DC

## 2016-08-12 MED ORDER — WARFARIN SODIUM 4 MG PO TABS
4.0000 mg | ORAL_TABLET | Freq: Every day | ORAL | Status: DC
  Administered 2016-08-12: 4 mg via ORAL
  Filled 2016-08-12 (×2): qty 1

## 2016-08-12 NOTE — ED Notes (Signed)
Patient transported to X-ray 

## 2016-08-12 NOTE — Care Management Obs Status (Signed)
MEDICARE OBSERVATION STATUS NOTIFICATION   Patient Details  Name: Lucas Caldwell MRN: [id number] Date of Birth: [birth year]   Medicare Observation Status Notification Given:  Yes Explained notice in detail. Patient declined to sign notice at present.  Wants his wife to read it first "and see what she thinks."  She will be bak on the unit later this evening   Katrina Stack, RN 08/12/2016, 4:50 PM

## 2016-08-12 NOTE — ED Triage Notes (Signed)
Pt reports shortness of breath today, reports dry, non productive cough. States "I don't know what's wrong, I'm just not feeling well".

## 2016-08-12 NOTE — ED Notes (Signed)
ED Provider at bedside. 

## 2016-08-12 NOTE — ED Provider Notes (Signed)
[facility] Emergency Department Provider Note   ____________________________________________   First MD Initiated Contact with Patient 08/12/16 [phone]     (approximate)  I have reviewed the triage vital signs and the nursing notes.   HISTORY  Chief Complaint Shortness of Breath    HPI Lucas Caldwell is a 83 y.o. male reports he's had about 2 weeks of increasing shortness of breath and chest tightness with exertion. 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 He has some swelling in his legs but not much. He had a stent put in years ago. [name] wondering if he needs another one. [name] not having any chest pain at present. [name] breathing comfortably at present also.   Past Medical History:  Diagnosis Date  . Breast asymmetry    Left breast is larger, present for several years.  . Cancer ([place])    Chronic myeloid leukemia  . CHF (congestive heart failure) ([place])   . Diabetes mellitus without complication ([place])   . Gout   . Hyperlipemia   . Hypertension   . Thyroid disease     Patient Active Problem List   Diagnosis Date Noted  . CML (chronic myelocytic leukemia) ([name]) 12/29/2015  . Acute renal failure (ARF) ([place]) 12/08/2015    Past Surgical History:  Procedure Laterality Date  . CARDIAC CATHETERIZATION     2 stents  . HERNIA REPAIR    . TOE AMPUTATION      Prior to Admission medications   Medication Sig Start Date End Date Taking? Authorizing Provider  albuterol (PROVENTIL HFA;VENTOLIN HFA) 108 (90 BASE) MCG/ACT inhaler Inhale 2 puffs into the lungs every 6 (six) hours as needed for wheezing or shortness of breath. 11/04/14   Ahmed Prima, MD  allopurinol (ZYLOPRIM) 300 MG tablet Take 300 mg by mouth every evening. 11/10/15   [provider]  aspirin EC 81 MG tablet Take 81 mg by mouth every morning. 03/05/12   [provider]    atropine 1 % ophthalmic solution Place 1 drop into the right eye at bedtime.    [provider]  digoxin (LANOXIN) 0.125 MG tablet Take 125 mcg by mouth at bedtime. 11/10/15   [provider]  doxycycline (VIBRAMYCIN) 100 MG capsule Take 1 capsule (100 mg total) by mouth 2 (two) times daily. 07/20/16   Triplett, Cari B, FNP  enalapril (VASOTEC) 5 MG tablet Take 5 mg by mouth 2 (two) times daily. 11/22/15   [provider]  eplerenone (INSPRA) 25 MG tablet Take 25 mg by mouth every evening. 11/10/15   [provider]  furosemide (LASIX) 40 MG tablet Take 1 tablet by mouth See admin instructions. Take 40 mg by mouth every morning and take 40 mg by mouth at lunchtime as needed for swelling or fluid retention. 11/10/15   [provider]  hydrocortisone 2.5 % ointment Apply topically 3 (three) times daily. 07/20/16   Triplett, Cari B, FNP  LANTUS SOLOSTAR 100 UNIT/ML Solostar Pen Inject 20 Units into the skin at bedtime. 12/11/15   Demetrios Loll, MD  levothyroxine (SYNTHROID, LEVOTHROID) 50 MCG tablet Take 50 mcg by mouth daily before breakfast. 10/27/15   [provider]  metoprolol succinate (TOPROL-XL) 50 MG 24 hr tablet Take 50 mg by mouth every morning. 12/04/15   [provider]  mirtazapine (REMERON) 15 MG tablet Take 15  mg by mouth at bedtime. 11/02/15   [provider]  Multiple Vitamins-Minerals (MULTIVITAMIN ADULTS 50+ PO) Take 1 tablet by mouth every morning.    [provider]  omeprazole (PRILOSEC) 40 MG capsule Take 40 mg by mouth every morning. 11/10/15   [provider]  oxyCODONE (OXY IR/ROXICODONE) 5 MG immediate release tablet Take 5 mg by mouth every 6 (six) hours as needed for pain. 10/25/15   [provider]  simvastatin (ZOCOR) 20 MG tablet Take 20 mg by mouth every evening. 08/30/11   [provider]  Spacer/Aero-Holding [facility] ADVANTAGE) [place] 1 each by Other route once. 11/04/14    Ahmed Prima, MD  SPIRIVA HANDIHALER 18 MCG inhalation capsule Place 18 mcg into inhaler and inhale every morning. 12/04/15   [provider]  warfarin (COUMADIN) 2 MG tablet Take 4 mg by mouth at bedtime. 12/04/15   [provider]    Allergies Clopidogrel; Ciprofloxacin; Lyrica [pregabalin]; Spironolactone; and Plavix [clopidogrel bisulfate]  No family history on file.  Social History Social History  Substance Use Topics  . Smoking status: Former Research scientist (life sciences)  . Smokeless tobacco: Never Used  . Alcohol use No    Review of Systems  Constitutional: No fever/chills Eyes: No visual changes. ENT: No sore throat. Cardiovascular:See history of present illness Respiratory: See history of present illness. Gastrointestinal: No abdominal pain.  No nausea, no vomiting.  No diarrhea.  No constipation. Genitourinary: Negative for dysuria. Musculoskeletal: Negative for back pain. Skin: Negative for rash. Neurological: Negative for headaches, focal weakness or numbness.   ____________________________________________   PHYSICAL EXAM:  VITAL SIGNS: ED Triage Vitals  Enc Vitals Group     BP 08/12/16 0832 (!) 156/58     Pulse Rate 08/12/16 0831 63     Resp 08/12/16 0831 20     Temp 08/12/16 0831 98.4 F (36.9 C)     Temp Source 08/12/16 0831 Oral     SpO2 08/12/16 0831 98 %     Weight 08/12/16 0831 204 lb (92.5 kg)     Height 08/12/16 0831 5\' 4"  (1.626 m)     Head Circumference --      Peak Flow --      Pain Score 08/12/16 0831 0     Pain Loc --      Pain Edu? --      Excl. in [place]? --     Constitutional: Alert and oriented. Well appearing and in no acute distress. Eyes: Conjunctivae are normal.  Head: Atraumatic. Nose: No congestion/rhinnorhea. Mouth/Throat: Mucous membranes are moist.  Oropharynx non-erythematous. Neck: No stridor.  Cardiovascular: Normal rate, regular rhythm. Grossly normal heart sounds.  Good peripheral circulation. Respiratory: Normal  respiratory effort.  No retractions. Lungs CTAB. Gastrointestinal: Soft and nontender. No distention. No abdominal bruits. No CVA tenderness. Musculoskeletal: No lower extremity tenderness trace edema.  No joint effusions. Neurologic:  Normal speech and language. No gross focal neurologic deficits are appreciated.  Skin:  Skin is warm, dry and intact. No rash noted.   ____________________________________________   LABS (all labs ordered are listed, but only abnormal results are displayed)  Labs Reviewed  BASIC METABOLIC PANEL - Abnormal; Notable for the following:       Result Value   Glucose, Bld 186 (*)    BUN 21 (*)    Creatinine, Ser 1.28 (*)    GFR calc non Af Amer 51 (*)    GFR calc Af Amer 59 (*)    All other  components within normal limits  CBC - Abnormal; Notable for the following:    RBC 3.80 (*)    Hemoglobin 10.0 (*)    HCT 30.0 (*)    MCV 79.0 (*)    RDW 17.4 (*)    All other components within normal limits  TROPONIN I  BRAIN NATRIURETIC PEPTIDE   ____________________________________________  EKG  EKG read and interpreted by me shows normal sinus rhythm rate of 65 years first-degree AV block and right bundle branch block and a computer is also reading left anterior hemiblock. There is no acute ST-T wave changes     ____________________________________________  RADIOLOGY  __IMPRESSION: Mild vascular congestion.   Electronically Signed   By: Inez Catalina M.D.   On: 08/12/2016 08:58__________________________________________   PROCEDURES  Procedure(s) performed:   Procedures  Critical Care performed:   ____________________________________________   INITIAL IMPRESSION / ASSESSMENT AND PLAN / ED COURSE  Pertinent labs & imaging results that were available during my care of the patient were reviewed by me and considered in my medical decision making (see chart for details).    patient's symptoms sound like they are consistent with crescendo  angina I will ask the hospitalist to admit   ____________________________________________   FINAL CLINICAL IMPRESSION(S) / ED DIAGNOSES  Final diagnoses:  Acute systolic congestive heart failure (HCC)  Angina of effort (HCC)      NEW MEDICATIONS STARTED DURING THIS VISIT:  New Prescriptions   No medications on file     Note:  This document was prepared using Dragon voice recognition software and may include unintentional dictation errors.    Nena Polio, MD 08/12/16 1013

## 2016-08-12 NOTE — ED Notes (Signed)
Tech at bedside for echo. Pt NAD. No needs at this time. Wife remains in room.

## 2016-08-12 NOTE — H&P (Signed)
Lucas Caldwell is an 83 y.o. male.   Chief Complaint: Shortness of breath HPI: This is an 83 year old male who has a history of coronary artery disease. He had 2 stents placed in 2014 at [facility]. He reports for the last 2 weeks she's had shortness of breath with minimal exertion. He says now is having trouble sleeping at night because the shortness of breath. He denies any chest pain.  Past Medical History:  Diagnosis Date  . Breast asymmetry    Left breast is larger, present for several years.  . Cancer ([place])    Chronic myeloid leukemia  . CHF (congestive heart failure) ([place])   . Diabetes mellitus without complication ([place])   . Gout   . Hyperlipemia   . Hypertension   . Thyroid disease     Past Surgical History:  Procedure Laterality Date  . CARDIAC CATHETERIZATION     2 stents  . HERNIA REPAIR    . TOE AMPUTATION      No family history on file. Social History:  reports that he has quit smoking. He has never used smokeless tobacco. He reports that he does not drink alcohol. His drug history is not on file.  Allergies:  Allergies  Allergen Reactions  . Clopidogrel Anaphylaxis    Other reaction(s): ANAPHYLAXIS;altered mental status;  (electrolytes "out of wack" and confusion per family; THEY DO NOT REMEMBER ANY OTHER REACTION)- MSB 10/10/15  . Ciprofloxacin Itching  . Lyrica [Pregabalin] Itching  . Spironolactone     Other reaction(s): Other (See Comments) gynecomastia  . Plavix [Clopidogrel Bisulfate] Anxiety     (Not in a hospital admission)  Results for orders placed or performed during the hospital encounter of 08/12/16 (from the past 48 hour(s))  Basic metabolic panel     Status: Abnormal   Collection Time: 08/12/16  8:51 AM  Result Value Ref Range   Sodium 135 135 - 145 mmol/L   Potassium 4.2 3.5 - 5.1 mmol/L   Chloride 105 101 - 111 mmol/L   CO2 23 22 - 32 mmol/L   Glucose, Bld 186 (H) 65 - 99 mg/dL   BUN 21 (H) 6 - 20 mg/dL   Creatinine, Ser 1.28 (H) 0.61  - 1.24 mg/dL   Calcium 9.4 8.9 - 10.3 mg/dL   GFR calc non Af Amer 51 (L) >60 mL/min   GFR calc Af Amer 59 (L) >60 mL/min    Comment: (NOTE) The eGFR has been calculated using the CKD EPI equation. This calculation has not been validated in all clinical situations. eGFR's persistently <60 mL/min signify possible Chronic Kidney Disease.    Anion gap 7 5 - 15  CBC     Status: Abnormal   Collection Time: 08/12/16  8:51 AM  Result Value Ref Range   WBC 9.6 3.8 - 10.6 K/uL   RBC 3.80 (L) 4.40 - 5.90 MIL/uL   Hemoglobin 10.0 (L) 13.0 - 18.0 g/dL   HCT 30.0 (L) 40.0 - 52.0 %   MCV 79.0 (L) 80.0 - 100.0 fL   MCH 26.2 26.0 - 34.0 pg   MCHC 33.2 32.0 - 36.0 g/dL   RDW 17.4 (H) 11.5 - 14.5 %   Platelets 170 150 - 440 K/uL  Troponin I     Status: None   Collection Time: 08/12/16  8:51 AM  Result Value Ref Range   Troponin I <0.03 <0.03 ng/mL  Brain natriuretic peptide     Status: None   Collection Time: 08/12/16  8:51  AM  Result Value Ref Range   B Natriuretic Peptide 95.0 0.0 - 100.0 pg/mL   Dg Chest 2 View  Result Date: 08/12/2016 CLINICAL DATA:  Chest pain and shortness of Breath EXAM: CHEST  2 VIEW COMPARISON:  06/20/2016 FINDINGS: Cardiac shadow is stable. Mild vascular congestion is seen. No focal infiltrate or sizable effusion is noted. No bony abnormality is seen. IMPRESSION: Mild vascular congestion. Electronically Signed   By: Inez Catalina M.D.   On: 08/12/2016 08:58    Review of Systems  Constitutional: Negative for chills and fever.  HENT: Negative for hearing loss.   Eyes: Negative for blurred vision.  Respiratory: Positive for shortness of breath. Negative for cough.   Cardiovascular: Negative for chest pain.  Gastrointestinal: Negative for nausea and vomiting.  Genitourinary: Negative for dysuria.  Musculoskeletal: Negative for myalgias.  Skin: Negative for rash.  Neurological: Negative for dizziness.    Blood pressure (!) 162/68, pulse (!) 57, temperature 98.4 F  (36.9 C), temperature source Oral, resp. rate 17, height '5\' 4"'  (1.626 m), weight 92.5 kg (204 lb), SpO2 99 %. Physical Exam  Constitutional: [name] oriented to person, place, and time. He appears well-developed and well-nourished. No distress.  HENT:  Head: Normocephalic and atraumatic.  Eyes: Pupils are equal, round, and reactive to light. No scleral icterus.  Neck: Neck supple. No JVD present. No tracheal deviation present. No thyromegaly present.  Cardiovascular: Normal rate and regular rhythm.   No murmur heard. Respiratory: Effort normal and breath sounds normal. No respiratory distress. He exhibits no tenderness.  GI: Soft. Bowel sounds are normal. He exhibits no distension and no mass.  Musculoskeletal: Normal range of motion. He exhibits no edema or tenderness.  Lymphadenopathy:    He has no cervical adenopathy.  Neurological: [name] alert and oriented to person, place, and time. No cranial nerve deficit.  Skin: Skin is warm and dry.     Assessment/Plan 1. Exertional dyspnea. The symptoms have been worsening over the last 2 weeks. His chest x-ray did show some cephalization but no frank pulmonary edema. He may benefit from a little bit of diuresis so we'll go ahead and give him a dose of Lasix. However we'll have to be careful of his renal function. He had 2 stents placed in 2014 and was told he had another vessel that had a lesion that did not need a stent. His symptoms may be related to worsening blockage now. We'll go ahead and get echocardiogram and cardiology consult further evaluate. 2. Coronary artery disease. We'll go ahead and assess if patient has any new lesions. Cardiology has been consulted. 3. Atrial fibrillation. Today he is in a sinus rhythm with a little bit of bradycardia and first-degree block. [name] on digoxin so we'll put this on hold. He's also on anticoagulation but his INR has not been checked yet. We'll check this and adjust his Coumadin as needed  4.  Hypertension. We'll continue his current medications 5. Diabetes. Continue his current medications.  Total time spent 35 minutes  Baxter Hire, MD 08/12/2016, 11:53 AM

## 2016-08-13 ENCOUNTER — Observation Stay: Admit: 2016-08-13 | Payer: Medicare HMO

## 2016-08-13 ENCOUNTER — Encounter: Payer: Self-pay | Admitting: Nurse Practitioner

## 2016-08-13 DIAGNOSIS — I200 Unstable angina: Secondary | ICD-10-CM | POA: Diagnosis not present

## 2016-08-13 LAB — GLUCOSE, CAPILLARY
Glucose-Capillary: 104 mg/dL — ABNORMAL HIGH (ref 65–99)
Glucose-Capillary: 176 mg/dL — ABNORMAL HIGH (ref 65–99)
Glucose-Capillary: 188 mg/dL — ABNORMAL HIGH (ref 65–99)
Glucose-Capillary: 181 mg/dL — ABNORMAL HIGH (ref 65–99)

## 2016-08-13 LAB — BASIC METABOLIC PANEL
ANION GAP: 7 (ref 5–15)
BUN: 24 mg/dL — ABNORMAL HIGH (ref 6–20)
CALCIUM: 9.3 mg/dL (ref 8.9–10.3)
CO2: 26 mmol/L (ref 22–32)
Chloride: 105 mmol/L (ref 101–111)
Creatinine, Ser: 1.37 mg/dL — ABNORMAL HIGH (ref 0.61–1.24)
GFR calc Af Amer: 55 mL/min — ABNORMAL LOW (ref >60)
GFR, EST NON AFRICAN AMERICAN: 47 mL/min — AB (ref >60)
GLUCOSE: 96 mg/dL (ref 65–99)
POTASSIUM: 3.9 mmol/L (ref 3.5–5.1)
SODIUM: 138 mmol/L (ref 135–145)

## 2016-08-13 LAB — TROPONIN I

## 2016-08-13 LAB — PROTIME-INR
INR: 1.69
PROTHROMBIN TIME: 20.1 s — AB (ref 11.4–15.2)

## 2016-08-13 MED ORDER — ASPIRIN 81 MG PO CHEW
81.0000 mg | CHEWABLE_TABLET | ORAL | Status: AC
  Administered 2016-08-14: 81 mg via ORAL
  Filled 2016-08-13: qty 1

## 2016-08-13 NOTE — Consult Note (Signed)
Cardiology Consult    Patient ID: AEDEN Caldwell MRN: [id number], DOB/AGE: 05/08/1933   Admit date: 08/12/2016  Date of Consult: 08/13/2016  Primary Physician: Abran Duke, MD Primary Cardiologist: Raliegh Ip. Andree Elk, [place] Requesting Provider: Governor Specking, MD  Patient Profile    Lucas Caldwell is a 83 y.o. male with a history of NICM, chronic combined syst/diast CHF, CKD, HTN, HL, DM, CML, IDA, and hypothyroidism, who is being seen today for the evaluation of dyspnea at the request of Dr. Vianne Bulls.  Past Medical History   Past Medical History:  Diagnosis Date  . Asthma   . BPH (benign prostatic hyperplasia)   . Breast asymmetry    Left breast is larger, present for several years.  [name] CAD (coronary artery disease)    a. Cath in the late 90's - reportedly ok;  b. 09/2012 Cath/PCI:  2 stents placed - unknown vessels.  . Chronic combined systolic (congestive) and diastolic (congestive) heart failure ([place])    a. Previously reduced EF-->50% by echo in 2012;  b. 06/2015 Echo: EF 50-55%  c. 07/2016 Echo: EF 45-50%.  . CKD (chronic kidney disease), stage III   . CML (chronic myelocytic leukemia) ([place])   . Diabetes mellitus without complication ([place])   . Gout   . Hyperlipemia   . Hypertension   . Hypothyroidism   . Iron deficiency anemia   . NICM (nonischemic cardiomyopathy) ([place])    a. Previously reduced EF-->50% by echo in 2012;  b. 06/2015 Echo: EF 50-55%, Gr2 DD, mild MR, mildly dil LA, Ao sclerosis, mild TR;  c. 07/2016 Echo: EF 45-50%, ? inf HK, mild MR, mildly dil LA.  [name] PAF (paroxysmal atrial fibrillation) (HCC)    a. ? Dx 2014-->s/p DCCV;  b. CHA2DS2VASc = 6-->chronic coumadin.  [name] RBBB     Past Surgical History:  Procedure Laterality Date  . CARDIAC CATHETERIZATION  09/2012   2 stents  . HERNIA REPAIR    . TOE AMPUTATION       Allergies  Allergies  Allergen Reactions  . Clopidogrel Anaphylaxis    Other reaction(s): ANAPHYLAXIS;altered mental status;  (electrolytes  "out of wack" and confusion per family; THEY DO NOT REMEMBER ANY OTHER REACTION)- MSB 10/10/15  . Ciprofloxacin Itching  . Lyrica [Pregabalin] Itching  . Spironolactone     Other reaction(s): Other (See Comments) gynecomastia  . Plavix [Clopidogrel Bisulfate] Anxiety    History of Present Illness    83 y/o ? with a h/o NICM dating back to the late 1990's, CAD (dx 2014  2 stents placed), HTN, HL, DM II, PAF (chronic coumadin), CKD III, CML, IDA, BPH, and hypothyroidism.  [name] followed closely @ [facility] cardiology.  EF previously depressed but normalized by 2012.  Last echo in 06/2015 showed EF of 50-55%.  Last seen by Dr. Andree Elk in March, @ which time he was doing well.  He says that beginning in early April, he began to note increasing DOE.  This has progressed to the point, that he doesn't think he can walk 50 yds w/o dyspnea.  Over the same period of time, he has noted increased frequency of exertional sscp/pressure that resolves with rest.  This is sometimes/often associated with increased HR's (h/o PAF).  Symptoms typically resolve with rest.    On Saturday 5/26, he noted more frequent palpitations associated with chest pressure.  This was on and off throughout the day.  He slept poorly Saturday night due to chest pressure and dyspnea.  On Sunday morning, he went on to church and felt well, but after returning home, he had recurrent chest pressure.  This was on and off throughout the day Sunday and so on Monday morning, he decided to come into the [facility] ED.  Here, ECG was non-acute (chronic RBBB).  Troponins were nl.  CXR showed mild vascular congestion.  He received one dose of IV lasix and was admitted for further eval.  Creat up slightly this am - currently on lasix 20 PO daily.  No complaints since admission.  Tele shows sinus rhythm/sinus brady, occas pvc's.  Inpatient Medications    . allopurinol  300 mg Oral Daily  . aspirin EC  81 mg Oral BH-q7a  . bosutinib  200 mg Oral Q supper  .  enalapril  5 mg Oral Daily  . famotidine  20 mg Oral QHS  . furosemide  20 mg Oral Daily  . insulin aspart  0-5 Units Subcutaneous QHS  . insulin aspart  0-9 Units Subcutaneous TID WC  . insulin glargine  50 Units Subcutaneous QHS  . levothyroxine  25 mcg Oral QAC breakfast  . magnesium oxide  400 mg Oral BID  . metoprolol succinate  50 mg Oral BH-q7a  . mirtazapine  15 mg Oral QHS  . multivitamin with minerals   Oral BH-q7a  . pantoprazole  40 mg Oral Daily  . pregabalin  50 mg Oral TID  . simvastatin  20 mg Oral QPM  . sodium chloride flush  3 mL Intravenous Q12H  . warfarin  4 mg Oral QHS  . Warfarin - Physician Dosing Inpatient   Does not apply q53    Family History    Family History  Problem Relation Age of Onset  . Brain cancer Father   . Diabetes Sister   . Heart attack Sister     Social History    Social History   Social History  . Marital status: Married    Spouse name: N/A  . Number of children: N/A  . Years of education: N/A   Occupational History  . 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   Social History Main Topics  . Smoking status: Former Smoker    Packs/day: 2.00    Years: 15.00  . Smokeless tobacco: Never Used     Comment: quit 1979  . Alcohol use No     Comment: previously drank heavily - quit 1979.  . Drug use: Unknown  . Sexual activity: Not on file   Other Topics Concern  . Not on file   Social History Narrative  . No narrative on file     Review of Systems    General:  No chills, fever, night sweats or weight changes.  Cardiovascular:  +++ chest pain, +++ dyspnea on exertion, +++ chronic, mild L> upper and lower ext swelling, no orthopnea, +++ palpitations, no paroxysmal nocturnal dyspnea. Dermatological: No rash, lesions/masses Respiratory: No cough, +++ dyspnea Urologic: No hematuria, dysuria Abdominal:   No nausea, vomiting, diarrhea, bright red blood per rectum, melena, or hematemesis Neurologic:  No visual changes,  wkns, changes in mental status. All other systems reviewed and are otherwise negative except as noted above.  Physical Exam    Blood pressure (!) 156/68, pulse 64, temperature 98.6 F (37 C), temperature source Oral, resp. rate 17, height 5\' 4"  (1.626 m), weight 204 lb (92.5 kg), SpO2 100 %.  General: Pleasant, NAD Psych: Normal affect. Neuro: Alert and oriented X 3.  Moves all extremities spontaneously. HEENT: Normal  Neck: Supple without bruits or JVD. Lungs:  Resp regular and unlabored, CTA. Heart: RRR no s3, s4, or murmurs. Abdomen: Soft, non-tender, non-distended, BS + x 4.  Extremities: No clubbing, cyanosis.  Mild left UE/LLE swelling - pt says chronic. DP/PT/Radials 2+ and equal bilaterally.  Labs     Recent Labs  08/12/16 0851 08/12/16 1442 08/12/16 1955 08/13/16 0202  TROPONINI <0.03 <0.03 <0.03 <0.03   Lab Results  Component Value Date   WBC 9.6 08/12/2016   HGB 10.0 (L) 08/12/2016   HCT 30.0 (L) 08/12/2016   MCV 79.0 (L) 08/12/2016   PLT 170 08/12/2016     Recent Labs Lab 08/13/16 0202  NA 138  K 3.9  CL 105  CO2 26  BUN 24*  CREATININE 1.37*  CALCIUM 9.3  GLUCOSE 96   Lab Results  Component Value Date   INR 1.69 08/13/2016   INR 1.77 08/12/2016   INR 1.63 12/11/2015      Radiology Studies    Dg Chest 2 View  Result Date: 08/12/2016 CLINICAL DATA:  Chest pain and shortness of Breath EXAM: CHEST  2 VIEW COMPARISON:  06/20/2016 FINDINGS: Cardiac shadow is stable. Mild vascular congestion is seen. No focal infiltrate or sizable effusion is noted. No bony abnormality is seen. IMPRESSION: Mild vascular congestion. Electronically Signed   By: Inez Catalina M.D.   On: 08/12/2016 08:58    ECG & Cardiac Imaging    RSR, 65, 1st deg AVB, LAD, LAFB, RBBB  Assessment & Plan    1.  Unstable Angina/CAD:  Pt with a prior h/o CAD s/p stenting x 2 (unknown vessels) @ [facility] in 2014.  Since early April, he has been experiencing progressive exertional chest  discomfort and dyspnea.  Chest pressure became more frequent over the weekend, prompting ER visit.  ECG non-acute.  Trops nl. Creat mildly elevated @ 1.37 following gentle diuresis. Echo now shows mild reduction in EF to 45-50% (prev 50-55% 06/2015). Given unstable symptoms, I suspect he will be best served with diagnostic catheterization.  INR 1.69 today. Hold coumadin.  The patient understands that risks include but are not limited to stroke (1 in 1000), death (1 in 66), kidney failure [usually temporary] (1 in 500), bleeding (1 in 200), allergic reaction [possibly serious] (1 in 200), and agrees to proceed.  Cont asa,  blocker, statin, acei.  2.  Chronic combined syst/diast CHF/NICM:  Pt dx with NICM in the late 90's with subsequent improvement in LV fxn.  As above, echo in 06/2015 showed EF of 50-55%.  He has been followed closely by K. Andree Elk, MD @ [facility].  He has had progressive DOE @ home.  No orthopnea/PND/edema.  Wt has been stable @ 201-202 lbs, though he recently noted 204 lbs the other day.  CXR here notable for vascular congestion.  [name] minus 800 ml since admission.  Creat up slightly after IV lasix.  Appears to be euvolemic on exam.  Cont  blocker, acei, and PO lasix.  Of note, spiro was ordered on admission.  Pt has h/o gynecomastia when on this and he says that he is no longer taking it.  I have d/c'd.  We may wish to consider eplerenone as a substitute.  3.  Essential HTN:  Stable on  blocker, acei, lasix.  4.  HL:  On  Simva.  No recent lipids available.  5.  CKD III:  Creat up slightly in setting of iv diuresis  yesterday.  Follow.  6.  PAF:  In sinus currently.  He describes palpitations recently - he isn't sure if it's his afib or not.  Cont  blocker. Hold coumadin in setting of above.  No prior h/o CVA.  Trops nl.  Will not bridge w/ heparin.  Signed, [name], NP 08/13/2016, 11:05 AM

## 2016-08-13 NOTE — Progress Notes (Signed)
[place] [facility] [place] NAME: Lucas Caldwell    MR#:  [id number]  DATE OF BIRTH:  1934-03-04  SUBJECTIVE: Admitted for exertional dyspnea. No chest pain. Seen by cardiology,   CHIEF COMPLAINT:   Chief Complaint  Patient presents with  . Shortness of Breath    REVIEW OF SYSTEMS:   ROS CONSTITUTIONAL: No fever, fatigue or weakness.  EYES: No blurred or double vision.  EARS, NOSE, AND THROAT: No tinnitus or ear pain.  RESPIRATORY:  cough, shortness of breath,no wheezing or hemoptysis.  CARDIOVASCULAR: No chest pain, orthopnea, edema.  GASTROINTESTINAL: No nausea, vomiting, diarrhea or abdominal pain.  GENITOURINARY: No dysuria, hematuria.  ENDOCRINE: No polyuria, nocturia,  HEMATOLOGY: No anemia, easy bruising or bleeding SKIN: No rash or lesion. MUSCULOSKELETAL: No joint pain or arthritis.   NEUROLOGIC: No tingling, numbness, weakness.  PSYCHIATRY: No anxiety or depression.   DRUG ALLERGIES:   Allergies  Allergen Reactions  . Clopidogrel Anaphylaxis    Other reaction(s): ANAPHYLAXIS;altered mental status;  (electrolytes "out of wack" and confusion per family; THEY DO NOT REMEMBER ANY OTHER REACTION)- MSB 10/10/15  . Ciprofloxacin Itching  . Lyrica [Pregabalin] Itching  . Spironolactone     Other reaction(s): Other (See Comments) gynecomastia  . Plavix [Clopidogrel Bisulfate] Anxiety    VITALS:  Blood pressure (!) 141/70, pulse 61, temperature 98.1 F (36.7 C), temperature source Oral, resp. rate 16, height 5\' 4"  (1.626 m), weight 92.5 kg (204 lb), SpO2 100 %.  PHYSICAL EXAMINATION:  GENERAL:  83 y.o.-year-old patient lying in the bed with no acute distress.  EYES: Pupils equal, round, reactive to light and accommodation. No scleral icterus. Extraocular muscles intact.  HEENT: Head atraumatic, normocephalic. Oropharynx and nasopharynx clear.  NECK:  Supple, no jugular venous distention. No thyroid enlargement, no  tenderness.  LUNGS: Normal breath sounds bilaterally, no wheezing, rales,rhonchi or crepitation. No use of accessory muscles of respiration.  CARDIOVASCULAR: S1, S2 normal. No murmurs, rubs, or gallops.  ABDOMEN: Soft, nontender, nondistended. Bowel sounds present. No organomegaly or mass.  EXTREMITIES: No pedal edema, cyanosis, or clubbing.  NEUROLOGIC: Cranial nerves II through XII are intact. Muscle strength 5/5 in all extremities. Sensation intact. Gait not checked.  PSYCHIATRIC: The patient is alert and oriented x 3.  SKIN: No obvious rash, lesion, or ulcer.    LABORATORY PANEL:   CBC  Recent Labs Lab 08/12/16 0851  WBC 9.6  HGB 10.0*  HCT 30.0*  PLT 170   ------------------------------------------------------------------------------------------------------------------  Chemistries   Recent Labs Lab 08/13/16 0202  NA 138  K 3.9  CL 105  CO2 26  GLUCOSE 96  BUN 24*  CREATININE 1.37*  CALCIUM 9.3   ------------------------------------------------------------------------------------------------------------------  Cardiac Enzymes  Recent Labs Lab 08/13/16 0202  TROPONINI <0.03   ------------------------------------------------------------------------------------------------------------------  RADIOLOGY:  Dg Chest 2 View  Result Date: 08/12/2016 CLINICAL DATA:  Chest pain and shortness of Breath EXAM: CHEST  2 VIEW COMPARISON:  06/20/2016 FINDINGS: Cardiac shadow is stable. Mild vascular congestion is seen. No focal infiltrate or sizable effusion is noted. No bony abnormality is seen. IMPRESSION: Mild vascular congestion. Electronically Signed   By: Inez Catalina M.D.   On: 08/12/2016 08:58    EKG:   Orders placed or performed during the hospital encounter of 08/12/16  . EKG 12-Lead  . EKG 12-Lead  . ED EKG within 10 minutes  . ED EKG within 10 minutes    ASSESSMENT AND PLAN:   Unstable angina: Patient has  experiencing progressive exertional chest  discomfort, dyspnea, dizziness. Troponins are negative for 3 times, seen by cardiology for possible cardiac catheter. Audiogram showed EF 45-50%. Continue aspirin, statins, ACE inhibitor S, beta blockers. Hold the Coumadin. Check INR.  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 Has history of nonischemic cardiomyopathy in late 90s.  #3 essential hypertension: Controlled, continue beta blockers, Ace inhibitors #4 chronic kidney disease stage [id number] bump in CR.watch closely.  D/w wife   All the records are reviewed and case discussed with Care Management/Social Workerr. Management plans discussed with the patient, family and they are in agreement.  CODE STATUS:full  TOTAL TIME TAKING CARE OF THIS PATIENT: 81minutes.   POSSIBLE D/C IN 1-2 DAYS, DEPENDING ON CLINICAL CONDITION.   Epifanio Lesches M.D on 08/13/2016 at 1:45 PM  Between 7am to 6pm - Pager - [phone]  After 6pm go to [email or web address] - password [email or web address] [facility]  Office  [phone]  CC: Primary care physician; Abran Duke, MD   Note: This dictation was prepared with Dragon dictation along with smaller phrase technology. Any transcriptional errors that result from this process are unintentional.

## 2016-08-14 ENCOUNTER — Encounter
Admission: EM | Disposition: A | Discharge status: Other Acute Inpatient Hospital | Payer: Self-pay | Source: Home / Self Care | Attending: Emergency Medicine

## 2016-08-14 DIAGNOSIS — I200 Unstable angina: Secondary | ICD-10-CM | POA: Diagnosis not present

## 2016-08-14 DIAGNOSIS — I10 Essential (primary) hypertension: Secondary | ICD-10-CM | POA: Diagnosis not present

## 2016-08-14 DIAGNOSIS — I5043 Acute on chronic combined systolic (congestive) and diastolic (congestive) heart failure: Secondary | ICD-10-CM

## 2016-08-14 DIAGNOSIS — I480 Paroxysmal atrial fibrillation: Secondary | ICD-10-CM

## 2016-08-14 DIAGNOSIS — I25110 Atherosclerotic heart disease of native coronary artery with unstable angina pectoris: Principal | ICD-10-CM

## 2016-08-14 DIAGNOSIS — I5021 Acute systolic (congestive) heart failure: Secondary | ICD-10-CM

## 2016-08-14 DIAGNOSIS — N183 Chronic kidney disease, stage 3 (moderate): Secondary | ICD-10-CM | POA: Diagnosis not present

## 2016-08-14 HISTORY — PX: LEFT HEART CATH AND CORONARY ANGIOGRAPHY: CATH118249

## 2016-08-14 LAB — GLUCOSE, CAPILLARY
GLUCOSE-CAPILLARY: 196 mg/dL — AB (ref 65–99)
GLUCOSE-CAPILLARY: 268 mg/dL — AB (ref 65–99)
Glucose-Capillary: 155 mg/dL — ABNORMAL HIGH (ref 65–99)
Glucose-Capillary: 244 mg/dL — ABNORMAL HIGH (ref 65–99)

## 2016-08-14 LAB — BASIC METABOLIC PANEL
ANION GAP: 9 (ref 5–15)
BUN: 31 mg/dL — ABNORMAL HIGH (ref 6–20)
CO2: 24 mmol/L (ref 22–32)
CREATININE: 1.56 mg/dL — AB (ref 0.61–1.24)
Calcium: 9.3 mg/dL (ref 8.9–10.3)
Chloride: 103 mmol/L (ref 101–111)
GFR, EST AFRICAN AMERICAN: 47 mL/min — AB (ref >60)
GFR, EST NON AFRICAN AMERICAN: 40 mL/min — AB (ref >60)
Glucose, Bld: 223 mg/dL — ABNORMAL HIGH (ref 65–99)
Potassium: 4.1 mmol/L (ref 3.5–5.1)
SODIUM: 136 mmol/L (ref 135–145)

## 2016-08-14 LAB — CBC
HEMATOCRIT: 34.6 % — AB (ref 40.0–52.0)
HEMOGLOBIN: 11.4 g/dL — AB (ref 13.0–18.0)
MCH: 26.4 pg (ref 26.0–34.0)
MCHC: 32.8 g/dL (ref 32.0–36.0)
MCV: 80.5 fL (ref 80.0–100.0)
Platelets: 190 10*3/uL (ref 150–440)
RBC: 4.3 MIL/uL — ABNORMAL LOW (ref 4.40–5.90)
RDW: 18 % — AB (ref 11.5–14.5)
WBC: 8.8 10*3/uL (ref 3.8–10.6)

## 2016-08-14 LAB — APTT: aPTT: 37 seconds — ABNORMAL HIGH (ref 24–36)

## 2016-08-14 LAB — PROTIME-INR
INR: 1.52
PROTHROMBIN TIME: 18.5 s — AB (ref 11.4–15.2)

## 2016-08-14 SURGERY — LEFT HEART CATH AND CORONARY ANGIOGRAPHY
Anesthesia: Moderate Sedation

## 2016-08-14 MED ORDER — VERAPAMIL HCL 2.5 MG/ML IV SOLN
INTRAVENOUS | Status: DC | PRN
  Administered 2016-08-14: 2.5 mg via INTRAVENOUS

## 2016-08-14 MED ORDER — SODIUM CHLORIDE 0.9% FLUSH
3.0000 mL | Freq: Two times a day (BID) | INTRAVENOUS | Status: DC
  Administered 2016-08-14: 3 mL via INTRAVENOUS

## 2016-08-14 MED ORDER — HEPARIN SODIUM (PORCINE) 1000 UNIT/ML IJ SOLN
INTRAMUSCULAR | Status: DC | PRN
  Administered 2016-08-14: 4500 [IU] via INTRAVENOUS

## 2016-08-14 MED ORDER — SODIUM CHLORIDE 0.9 % IV SOLN: 250.0000 mL | INTRAVENOUS | Status: DC | PRN

## 2016-08-14 MED ORDER — SODIUM CHLORIDE 0.9% FLUSH: 3.0000 mL | INTRAVENOUS | Status: DC | PRN

## 2016-08-14 MED ORDER — HEPARIN (PORCINE) IN NACL 100-0.45 UNIT/ML-% IJ SOLN
1100.0000 [IU]/h | INTRAMUSCULAR | Status: DC
  Administered 2016-08-15: 1100 [IU]/h via INTRAVENOUS
  Filled 2016-08-14: qty 250

## 2016-08-14 MED ORDER — FENTANYL CITRATE (PF) 100 MCG/2ML IJ SOLN
INTRAMUSCULAR | Status: AC
  Filled 2016-08-14: qty 2

## 2016-08-14 MED ORDER — SODIUM CHLORIDE 0.9 % IV SOLN: INTRAVENOUS | Status: DC

## 2016-08-14 MED ORDER — VERAPAMIL HCL 2.5 MG/ML IV SOLN
INTRAVENOUS | Status: AC
  Filled 2016-08-14: qty 2

## 2016-08-14 MED ORDER — NITROGLYCERIN 1 MG/10 ML FOR IR/CATH LAB
INTRA_ARTERIAL | Status: DC | PRN
  Administered 2016-08-14: 200 ug via INTRACORONARY

## 2016-08-14 MED ORDER — INSULIN GLARGINE 100 UNIT/ML ~~LOC~~ SOLN
30.0000 [IU] | Freq: Every day | SUBCUTANEOUS | Status: DC
  Administered 2016-08-14: 30 [IU] via SUBCUTANEOUS
  Filled 2016-08-14 (×2): qty 0.3

## 2016-08-14 MED ORDER — HEPARIN SODIUM (PORCINE) 1000 UNIT/ML IJ SOLN
INTRAMUSCULAR | Status: AC
  Filled 2016-08-14: qty 1

## 2016-08-14 MED ORDER — SODIUM CHLORIDE 0.9 % IV SOLN
INTRAVENOUS | Status: AC
  Administered 2016-08-14: 15:00:00 via INTRAVENOUS

## 2016-08-14 MED ORDER — NITROGLYCERIN 5 MG/ML IV SOLN
INTRAVENOUS | Status: AC
  Filled 2016-08-14: qty 10

## 2016-08-14 MED ORDER — FENTANYL CITRATE (PF) 100 MCG/2ML IJ SOLN
INTRAMUSCULAR | Status: DC | PRN
  Administered 2016-08-14: 25 ug via INTRAVENOUS

## 2016-08-14 MED ORDER — MIDAZOLAM HCL 2 MG/2ML IJ SOLN
INTRAMUSCULAR | Status: DC | PRN
  Administered 2016-08-14: 1 mg via INTRAVENOUS

## 2016-08-14 MED ORDER — IOPAMIDOL (ISOVUE-300) INJECTION 61%
INTRAVENOUS | Status: DC | PRN
  Administered 2016-08-14: 80 mL via INTRA_ARTERIAL

## 2016-08-14 MED ORDER — MIDAZOLAM HCL 2 MG/2ML IJ SOLN
INTRAMUSCULAR | Status: AC
  Filled 2016-08-14: qty 2

## 2016-08-14 MED ORDER — HEPARIN (PORCINE) IN NACL 2-0.9 UNIT/ML-% IJ SOLN
INTRAMUSCULAR | Status: AC
  Filled 2016-08-14: qty 500

## 2016-08-14 MED ORDER — HEPARIN (PORCINE) IN NACL 100-0.45 UNIT/ML-% IJ SOLN: 1100.0000 [IU]/h | INTRAMUSCULAR | Status: DC

## 2016-08-14 MED ORDER — SODIUM CHLORIDE 0.9 % IV SOLN: INTRAVENOUS | Status: AC

## 2016-08-14 SURGICAL SUPPLY — 8 items
CATH INFINITI 5 FR JL3.5 (CATHETERS) ×3 IMPLANT
CATH INFINITI 5FR ANG PIGTAIL (CATHETERS) ×3 IMPLANT
CATH INFINITI JR4 5F (CATHETERS) ×3 IMPLANT
DEVICE RAD TR BAND REGULAR (VASCULAR PRODUCTS) ×3 IMPLANT
GLIDESHEATH SLEND SS 6F .021 (SHEATH) ×3 IMPLANT
KIT MANI 3VAL PERCEP (MISCELLANEOUS) ×3 IMPLANT
PACK CARDIAC CATH (CUSTOM PROCEDURE TRAY) ×3 IMPLANT
WIRE ROSEN-J .035X260CM (WIRE) ×3 IMPLANT

## 2016-08-14 NOTE — Progress Notes (Signed)
[place] at [place] NAME: Lucas Caldwell    MR#:  [id number]  DATE OF BIRTH:  01-26-1934  SUBJECTIVE: Admitted for exertional dyspnea. No chest pain. For  cardiac catheter today. `  CHIEF COMPLAINT:   Chief Complaint  Patient presents with  . Shortness of Breath    REVIEW OF SYSTEMS:   ROS CONSTITUTIONAL: No fever, fatigue or weakness.  EYES: No blurred or double vision.  EARS, NOSE, AND THROAT: No tinnitus or ear pain.  RESPIRATORY:  cough, shortness of breath,no wheezing or hemoptysis.  CARDIOVASCULAR: No chest pain, orthopnea, edema.  GASTROINTESTINAL: No nausea, vomiting, diarrhea or abdominal pain.  GENITOURINARY: No dysuria, hematuria.  ENDOCRINE: No polyuria, nocturia,  HEMATOLOGY: No anemia, easy bruising or bleeding SKIN: No rash or lesion. MUSCULOSKELETAL: No joint pain or arthritis.   NEUROLOGIC: No tingling, numbness, weakness.  PSYCHIATRY: No anxiety or depression.   DRUG ALLERGIES:   Allergies  Allergen Reactions  . Clopidogrel Anaphylaxis    Other reaction(s): ANAPHYLAXIS;altered mental status;  (electrolytes "out of wack" and confusion per family; THEY DO NOT REMEMBER ANY OTHER REACTION)- MSB 10/10/15  . Ciprofloxacin Itching  . Lyrica [Pregabalin] Itching  . Spironolactone     Other reaction(s): Other (See Comments) gynecomastia  . Plavix [Clopidogrel Bisulfate] Anxiety    VITALS:  Blood pressure (!) 148/66, pulse (!) 58, temperature 98.4 F (36.9 C), temperature source Oral, resp. rate 19, height 5\' 4"  (1.626 m), weight 89.4 kg (197 lb 3.2 oz), SpO2 100 %.  PHYSICAL EXAMINATION:  GENERAL:  83 y.o.-year-old patient lying in the bed with no acute distress.  EYES: Pupils equal, round, reactive to light and accommodation. No scleral icterus. Extraocular muscles intact.  HEENT: Head atraumatic, normocephalic. Oropharynx and nasopharynx clear.  NECK:  Supple, no jugular venous distention. No thyroid  enlargement, no tenderness.  LUNGS: Normal breath sounds bilaterally, no wheezing, rales,rhonchi or crepitation. No use of accessory muscles of respiration.  CARDIOVASCULAR: S1, S2 normal. No murmurs, rubs, or gallops.  ABDOMEN: Soft, nontender, nondistended. Bowel sounds present. No organomegaly or mass.  EXTREMITIES: No pedal edema, cyanosis, or clubbing.  NEUROLOGIC: Cranial nerves II through XII are intact. Muscle strength 5/5 in all extremities. Sensation intact. Gait not checked.  PSYCHIATRIC: The patient is alert and oriented x 3.  SKIN: No obvious rash, lesion, or ulcer.    LABORATORY PANEL:   CBC  Recent Labs Lab 08/12/16 0851  WBC 9.6  HGB 10.0*  HCT 30.0*  PLT 170   ------------------------------------------------------------------------------------------------------------------  Chemistries   Recent Labs Lab 08/14/16 0521  NA 136  K 4.1  CL 103  CO2 24  GLUCOSE 223*  BUN 31*  CREATININE 1.56*  CALCIUM 9.3   ------------------------------------------------------------------------------------------------------------------  Cardiac Enzymes  Recent Labs Lab 08/13/16 0202  TROPONINI <0.03   ------------------------------------------------------------------------------------------------------------------  RADIOLOGY:  No results found.  EKG:   Orders placed or performed during the hospital encounter of 08/12/16  . EKG 12-Lead  . EKG 12-Lead  . ED EKG within 10 minutes  . ED EKG within 10 minutes    ASSESSMENT AND PLAN:   Unstable angina: Patient has experiencing progressive exertional chest discomfort, dyspnea, dizziness. Troponins are negative for 3 times, seen by cardiology for possible cardiac catheter. Echo  showed EF 45-50%. 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 Has history of nonischemic cardiomyopathy in late 90s.  #3 essential  hypertension: Controlled, continue beta blockers, Ace inhibitors #4 chronic kidney disease stage III acute on chronic kidney disease: Hold Vasotec, continue IV hydration. ;for cath am D/w wife   All the records are reviewed and case discussed with Care Management/Social Workerr. Management plans discussed with the patient, family and they are in agreement.  CODE STATUS:full  TOTAL TIME TAKING CARE OF THIS PATIENT: 40minutes.   POSSIBLE D/C IN 1-2 DAYS, DEPENDING ON CLINICAL CONDITION.   Epifanio Lesches M.D on 08/14/2016 at 1:21 PM  Between 7am to 6pm - Pager - [phone]  After 6pm go to [email or web address] - password [email or web address]  Office  [phone]  CC: Primary care physician; Abran Duke, MD   Note: This dictation was prepared with Dragon dictation along with smaller phrase technology. Any transcriptional errors that result from this process are unintentional.

## 2016-08-14 NOTE — Interval H&P Note (Signed)
History and Physical Interval Note:  08/14/2016 7:57 AM  [name]  has presented today for surgery, with the diagnosis of unstable angina. The various methods of treatment have been discussed with the patient and family. After consideration of risks, benefits and other options for treatment, the patient has consented to  Procedure(s): Left Heart Cath and Coronary Angiography (N/A) as a surgical intervention .  The patient's history has been reviewed, patient examined, no change in status, stable for surgery.  I have reviewed the patient's chart and labs.  Questions were answered to the patient's satisfaction.    Cath Lab Visit (complete for each Cath Lab visit)  Clinical Evaluation Leading to the Procedure:   ACS: Yes.   (unstable angina)  Non-ACS:    Anginal Classification: CCS III  Anti-ischemic medical therapy: Minimal Therapy (1 class of medications)  Non-Invasive Test Results: No non-invasive testing performed (decreased LVEF)  Prior CABG: No previous CABG   Ziah Turvey

## 2016-08-14 NOTE — Progress Notes (Signed)
Progress Note  Patient Name: Lucas Caldwell Date of Encounter: 08/14/2016  Primary Cardiologist: Carolynn Serve, MD - [facility]  Subjective   Mr. Sames feels well. No further chest pain or shortness of breath. No edema or orthopnea.  Inpatient Medications    Scheduled Meds: . [MAR Hold] allopurinol  300 mg Oral Daily  . [MAR Hold] aspirin EC  81 mg Oral BH-q7a  . [MAR Hold] bosutinib  200 mg Oral Q supper  . [MAR Hold] famotidine  20 mg Oral QHS  . [MAR Hold] insulin aspart  0-5 Units Subcutaneous QHS  . [MAR Hold] insulin aspart  0-9 Units Subcutaneous TID WC  . [MAR Hold] insulin glargine  30 Units Subcutaneous QHS  . [MAR Hold] levothyroxine  25 mcg Oral QAC breakfast  . [MAR Hold] magnesium oxide  400 mg Oral BID  . [MAR Hold] metoprolol succinate  50 mg Oral BH-q7a  . [MAR Hold] mirtazapine  15 mg Oral QHS  . [MAR Hold] multivitamin with minerals   Oral BH-q7a  . [MAR Hold] pantoprazole  40 mg Oral Daily  . [MAR Hold] pregabalin  50 mg Oral TID  . [MAR Hold] simvastatin  20 mg Oral QPM  . [MAR Hold] sodium chloride flush  3 mL Intravenous Q12H  . sodium chloride flush  3 mL Intravenous Q12H  . sodium chloride flush  3 mL Intravenous Q12H  . [MAR Hold] Warfarin - Physician Dosing Inpatient   Does not apply q1800   Continuous Infusions: . sodium chloride    . [START ON 08/15/2016] sodium chloride    . sodium chloride 100 mL/hr at 08/14/16 1528  . sodium chloride    . sodium chloride     PRN Meds: sodium chloride, sodium chloride, [MAR Hold] acetaminophen, [MAR Hold] albuterol, [MAR Hold] loperamide, sodium chloride flush, sodium chloride flush   Vital Signs    Vitals:   08/14/16 0746 08/14/16 1119 08/14/16 1522 08/14/16 1731  BP: (!) 145/60 (!) 148/66 (!) 174/66 (!) 163/58  Pulse: 62 (!) 58 60   Resp:  19 18   Temp:  98.4 F (36.9 C) 98 F (36.7 C)   TempSrc:  Oral Oral   SpO2: 98% 100% 99%   Weight:      Height:        Intake/Output Summary (Last 24  hours) at 08/14/16 1735 Last data filed at 08/14/16 1414  Gross per 24 hour  Intake             1000 ml  Output             1030 ml  Net              -30 ml   Filed Weights   08/12/16 0831 08/14/16 0453  Weight: 204 lb (92.5 kg) 197 lb 3.2 oz (89.4 kg)    Telemetry    Normal sinus rhythm with rare PVCs - Personally Reviewed  ECG    None  Physical Exam   GEN: Well-developed, well-nourished man lying in bed. [name] accompanied by his wife. Neck: No JVD or HJR. Cardiac: RRR, no murmurs, rubs, or gallops. 1+ radial pulses bilaterally. Respiratory: Clear to auscultation bilaterally. GI: Soft, nontender, non-distended  MS: No edema; No deformity. Neuro:  Nonfocal  Psych: Normal affect   Labs    Chemistry Recent Labs Lab 08/12/16 0851 08/13/16 0202 08/14/16 0521  NA 135 138 136  K 4.2 3.9 4.1  CL 105 105 103  CO2 23  26 24  GLUCOSE 186* 96 223*  BUN 21* 24* 31*  CREATININE 1.28* 1.37* 1.56*  CALCIUM 9.4 9.3 9.3  GFRNONAA 51* 47* 40*  GFRAA 59* 55* 47*  ANIONGAP 7 7 9      Hematology Recent Labs Lab 08/12/16 0851  WBC 9.6  RBC 3.80*  HGB 10.0*  HCT 30.0*  MCV 79.0*  MCH 26.2  MCHC 33.2  RDW 17.4*  PLT 170    Cardiac Enzymes Recent Labs Lab 08/12/16 0851 08/12/16 1442 08/12/16 1955 08/13/16 0202  TROPONINI <0.03 <0.03 <0.03 <0.03   No results for input(s): TROPIPOC in the last 168 hours.   BNP Recent Labs Lab 08/12/16 0851  BNP 95.0     DDimer No results for input(s): DDIMER in the last 168 hours.   Radiology    No results found.  Cardiac Studies   LHC (08/14/16) Conclusions: 1. Significant multivessel coronary artery disease, including 70% ostial LMCA stenosis (with catheter dampening) and 95% proximal/mid RCA lesion. 2. Patent stents in the mid LCx and mid RCA. 3. Upper normal left ventricular filling pressure.  Recommendations: 1. Given LMCA and RCA disease in the setting of diabetes mellitus, surgical consultation for CABG  should be considered. However, if comorbidities preclude surgical intervention, PCI to unprotected LMCA and mid RCA could be considered. 2. Medical therapy, including gentle hydration, given chronic kidney disease. 3. Aggressive secondary prevention.  Echo (08/12/16): Mildly dilated LV with mild LVH and LVEF 45-50% with inferior hypokinesis. Mild MR. Mild LA enlargement. Normal RV size and function.  Patient Profile     83 y.o. male with history of NICM, chronic systolic and diastolic heart failure, CAD s/p remote PCI, CKD stage III, HTN, DM, and CML, admitted with progressive exertional chest pain and shortness of breath, consistent with unstable angina.  Assessment & Plan    Unstable angina No further symptoms over the last 24 hours. Cardiac catheterization shows severe multivessel CAD, including 70% ostial LMCA and 95% proximal/mid RCA stenoses. Given DM, CABG should be considered. However, if his comorbidities preclude surgical revascularization, high-risk PCI to the ostial LMCA and proximal/mid RCA could be considered.  Anticipate transfer to tertiary care center for cardiac surgery consultation. Patient and his family wish to discuss transfer to [facility] versus [facility] tonight for likely transfer tomorrow.  Continue low-dose aspirin. Will check lipid panel in the AM with anticpication of escalating statin therapy.  Continue metoprolol.  Acute on chronic systolic and diastolic heart failure Progressive dyspnea may also reflect an element of heart failure. LVEF found to be reduced on echo to 45-50% this admission. Patient appears euvolemic on exam today; LVEDP upper normal at 15 mmHg.  Hydrate gently post-cath, given CKD and slight bump in creatinine today.  Continue metoprolol succinate.  Consider adding ACEI/ARB once renal function has stabilized.  Could also consider addition of BiDil.  Hypertension BP mildly elevated.  Continue to monitor. If BP remain elevated, would favor  adding ACEI/ARB (as renal function allows) or BiDil.  Hyperlipidemia  Check fasting lipid panel in the AM. If LDL is not well below 70, consider switching to higher-potency statin.  Chronic kidney disease stage III Slight bump in creatine yesterday in the setting of diuresis.  Hydrate gently.  Hold furosemide and ACEI for now.  Repeat BMP tomorrow.  Paroxysmal atrial fibrillation NSR with PVCs noted on telemetry. Warfarin held in anticipation of cardiac catheterization today.  Continue to hold warfarin.  Start heparin infusion 8 hours after radial sheath removal, given CHADSVASC  score of at least 6.  Signed, [name], MD  08/14/2016, 5:35 PM

## 2016-08-14 NOTE — Progress Notes (Signed)
ANTICOAGULATION CONSULT NOTE - Initial Consult  Pharmacy Consult for Heparin Indication: atrial fibrillation  Allergies  Allergen Reactions  . Clopidogrel Anaphylaxis    Other reaction(s): ANAPHYLAXIS;altered mental status;  (electrolytes "out of wack" and confusion per family; THEY DO NOT REMEMBER ANY OTHER REACTION)- MSB 10/10/15  . Ciprofloxacin Itching  . Lyrica [Pregabalin] Itching  . Spironolactone     Other reaction(s): Other (See Comments) gynecomastia  . Plavix [Clopidogrel Bisulfate] Anxiety    Patient Measurements: Height: 5\' 4"  (162.6 cm) Weight: 197 lb 3.2 oz (89.4 kg) IBW/kg (Calculated) : 59.2 Heparin Dosing Weight: 78.6 kg  Vital Signs: Temp: 98 F (36.7 C) (05/30 1522) Temp Source: Oral (05/30 1522) BP: 152/55 (05/30 1831) Pulse Rate: 54 (05/30 1831)  Labs:  Recent Labs  08/12/16 0851 08/12/16 1147 08/12/16 1442 08/12/16 1955 08/13/16 0202 08/14/16 0520 08/14/16 0521  HGB 10.0*  --   --   --   --   --   --   HCT 30.0*  --   --   --   --   --   --   PLT 170  --   --   --   --   --   --   LABPROT  --  20.8*  --   --  20.1* 18.5*  --   INR  --  1.77  --   --  1.69 1.52  --   CREATININE 1.28*  --   --   --  1.37*  --  1.56*  TROPONINI <0.03  --  <0.03 <0.03 <0.03  --   --     Estimated Creatinine Clearance: 38.1 mL/min (A) (by C-G formula based on SCr of 1.56 mg/dL (H)).   Medical History: Past Medical History:  Diagnosis Date  . Asthma   . BPH (benign prostatic hyperplasia)   . Breast asymmetry    Left breast is larger, present for several years.  [name] CAD (coronary artery disease)    a. Cath in the late 90's - reportedly ok;  b. 09/2012 Cath/PCI:  2 stents placed - unknown vessels.  . Chronic combined systolic (congestive) and diastolic (congestive) heart failure ([place])    a. Previously reduced EF-->50% by echo in 2012;  b. 06/2015 Echo: EF 50-55%  c. 07/2016 Echo: EF 45-50%.  . CKD (chronic kidney disease), stage III   . CML (chronic  myelocytic leukemia) ([place])   . Diabetes mellitus without complication ([place])   . Gout   . Hyperlipemia   . Hypertension   . Hypothyroidism   . Iron deficiency anemia   . NICM (nonischemic cardiomyopathy) ([place])    a. Previously reduced EF-->50% by echo in 2012;  b. 06/2015 Echo: EF 50-55%, Gr2 DD, mild MR, mildly dil LA, Ao sclerosis, mild TR;  c. 07/2016 Echo: EF 45-50%, ? inf HK, mild MR, mildly dil LA.  [name] PAF (paroxysmal atrial fibrillation) ([facility])    a. ? Dx 2014-->s/p DCCV;  b. CHA2DS2VASc = 6-->chronic coumadin.  [name] RBBB     Assessment: 83 yo male s/p cath to start on heparin drip for AFib 8 hours after radial sheath removal. Warfarin currently on hold for cath and possible surgical evaluation. Per specials TR band to be fully off by 1840.    Goal of Therapy:  Heparin level 0.3-0.7 units/ml Monitor platelets by anticoagulation protocol: Yes   Plan:  Will order baseline aPTT and CBC No heparin bolus Will start heparin 8h after TR band removal - start heparin IV  1100 units/hr (=11 ml/hr) at 0230  Heparin level 8h after start of heparin drip CBC in AM  Pharmacy will continue to follow.   Rocky Morel 08/14/2016,7:12 PM

## 2016-08-14 NOTE — H&P (View-Only) (Signed)
Cardiology Consult    Patient ID: Lucas Caldwell MRN: [id number], DOB/AGE: [birth year]   Admit date: 08/12/2016  Date of Consult: 08/13/2016  Primary Physician: Abran Duke, MD Primary Cardiologist: Raliegh Ip. Andree Elk, [place] Requesting Provider: Governor Specking, MD  Patient Profile    Lucas Caldwell is a 83 y.o. male with a history of NICM, chronic combined syst/diast CHF, CKD, HTN, HL, DM, CML, IDA, and hypothyroidism, who is being seen today for the evaluation of dyspnea at the request of Dr. Vianne Bulls.  Past Medical History   Past Medical History:  Diagnosis Date  . Asthma   . BPH (benign prostatic hyperplasia)   . Breast asymmetry    Left breast is larger, present for several years.  [name] CAD (coronary artery disease)    a. Cath in the late 90's - reportedly ok;  b. 09/2012 Cath/PCI:  2 stents placed - unknown vessels.  . Chronic combined systolic (congestive) and diastolic (congestive) heart failure ([place])    a. Previously reduced EF-->50% by echo in 2012;  b. 06/2015 Echo: EF 50-55%  c. 07/2016 Echo: EF 45-50%.  . CKD (chronic kidney disease), stage III   . CML (chronic myelocytic leukemia) ([place])   . Diabetes mellitus without complication ([place])   . Gout   . Hyperlipemia   . Hypertension   . Hypothyroidism   . Iron deficiency anemia   . NICM (nonischemic cardiomyopathy) ([place])    a. Previously reduced EF-->50% by echo in 2012;  b. 06/2015 Echo: EF 50-55%, Gr2 DD, mild MR, mildly dil LA, Ao sclerosis, mild TR;  c. 07/2016 Echo: EF 45-50%, ? inf HK, mild MR, mildly dil LA.  [name] PAF (paroxysmal atrial fibrillation) (HCC)    a. ? Dx 2014-->s/p DCCV;  b. CHA2DS2VASc = 6-->chronic coumadin.  [name] RBBB     Past Surgical History:  Procedure Laterality Date  . CARDIAC CATHETERIZATION  09/2012   2 stents  . HERNIA REPAIR    . TOE AMPUTATION       Allergies  Allergies  Allergen Reactions  . Clopidogrel Anaphylaxis    Other reaction(s): ANAPHYLAXIS;altered mental status;  (electrolytes  "out of wack" and confusion per family; THEY DO NOT REMEMBER ANY OTHER REACTION)- MSB 10/10/15  . Ciprofloxacin Itching  . Lyrica [Pregabalin] Itching  . Spironolactone     Other reaction(s): Other (See Comments) gynecomastia  . Plavix [Clopidogrel Bisulfate] Anxiety    History of Present Illness    83 y/o ? with a h/o NICM dating back to the late 1990's, CAD (dx 2014  2 stents placed), HTN, HL, DM II, PAF (chronic coumadin), CKD III, CML, IDA, BPH, and hypothyroidism.  [name] followed closely @ [facility] cardiology.  EF previously depressed but normalized by 2012.  Last echo in 06/2015 showed EF of 50-55%.  Last seen by Dr. Andree Elk in March, @ which time he was doing well.  He says that beginning in early April, he began to note increasing DOE.  This has progressed to the point, that he doesn't think he can walk 50 yds w/o dyspnea.  Over the same period of time, he has noted increased frequency of exertional sscp/pressure that resolves with rest.  This is sometimes/often associated with increased HR's (h/o PAF).  Symptoms typically resolve with rest.    On Saturday 5/26, he noted more frequent palpitations associated with chest pressure.  This was on and off throughout the day.  He slept poorly Saturday night due to chest pressure and dyspnea.  On Sunday morning, he went on to church and felt well, but after returning home, he had recurrent chest pressure.  This was on and off throughout the day Sunday and so on Monday morning, he decided to come into the [facility] ED.  Here, ECG was non-acute (chronic RBBB).  Troponins were nl.  CXR showed mild vascular congestion.  He received one dose of IV lasix and was admitted for further eval.  Creat up slightly this am - currently on lasix 20 PO daily.  No complaints since admission.  Tele shows sinus rhythm/sinus brady, occas pvc's.  Inpatient Medications    . allopurinol  300 mg Oral Daily  . aspirin EC  81 mg Oral BH-q7a  . bosutinib  200 mg Oral Q supper  .  enalapril  5 mg Oral Daily  . famotidine  20 mg Oral QHS  . furosemide  20 mg Oral Daily  . insulin aspart  0-5 Units Subcutaneous QHS  . insulin aspart  0-9 Units Subcutaneous TID WC  . insulin glargine  50 Units Subcutaneous QHS  . levothyroxine  25 mcg Oral QAC breakfast  . magnesium oxide  400 mg Oral BID  . metoprolol succinate  50 mg Oral BH-q7a  . mirtazapine  15 mg Oral QHS  . multivitamin with minerals   Oral BH-q7a  . pantoprazole  40 mg Oral Daily  . pregabalin  50 mg Oral TID  . simvastatin  20 mg Oral QPM  . sodium chloride flush  3 mL Intravenous Q12H  . warfarin  4 mg Oral QHS  . Warfarin - Physician Dosing Inpatient   Does not apply q75    Family History    Family History  Problem Relation Age of Onset  . Brain cancer Father   . Diabetes Sister   . Heart attack Sister     Social History    Social History   Social History  . Marital status: Married    Spouse name: N/A  . Number of children: N/A  . Years of education: N/A   Occupational History  . 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   Social History Main Topics  . Smoking status: Former Smoker    Packs/day: 2.00    Years: 15.00  . Smokeless tobacco: Never Used     Comment: quit 1979  . Alcohol use No     Comment: previously drank heavily - quit 1979.  . Drug use: Unknown  . Sexual activity: Not on file   Other Topics Concern  . Not on file   Social History Narrative  . No narrative on file     Review of Systems    General:  No chills, fever, night sweats or weight changes.  Cardiovascular:  +++ chest pain, +++ dyspnea on exertion, +++ chronic, mild L> upper and lower ext swelling, no orthopnea, +++ palpitations, no paroxysmal nocturnal dyspnea. Dermatological: No rash, lesions/masses Respiratory: No cough, +++ dyspnea Urologic: No hematuria, dysuria Abdominal:   No nausea, vomiting, diarrhea, bright red blood per rectum, melena, or hematemesis Neurologic:  No visual changes,  wkns, changes in mental status. All other systems reviewed and are otherwise negative except as noted above.  Physical Exam    Blood pressure (!) 156/68, pulse 64, temperature 98.6 F (37 C), temperature source Oral, resp. rate 17, height 5\' 4"  (1.626 m), weight 204 lb (92.5 kg), SpO2 100 %.  General: Pleasant, NAD Psych: Normal affect. Neuro: Alert and oriented X 3.  Moves all extremities spontaneously. HEENT: Normal  Neck: Supple without bruits or JVD. Lungs:  Resp regular and unlabored, CTA. Heart: RRR no s3, s4, or murmurs. Abdomen: Soft, non-tender, non-distended, BS + x 4.  Extremities: No clubbing, cyanosis.  Mild left UE/LLE swelling - pt says chronic. DP/PT/Radials 2+ and equal bilaterally.  Labs     Recent Labs  08/12/16 0851 08/12/16 1442 08/12/16 1955 08/13/16 0202  TROPONINI <0.03 <0.03 <0.03 <0.03   Lab Results  Component Value Date   WBC 9.6 08/12/2016   HGB 10.0 (L) 08/12/2016   HCT 30.0 (L) 08/12/2016   MCV 79.0 (L) 08/12/2016   PLT 170 08/12/2016     Recent Labs Lab 08/13/16 0202  NA 138  K 3.9  CL 105  CO2 26  BUN 24*  CREATININE 1.37*  CALCIUM 9.3  GLUCOSE 96   Lab Results  Component Value Date   INR 1.69 08/13/2016   INR 1.77 08/12/2016   INR 1.63 12/11/2015      Radiology Studies    Dg Chest 2 View  Result Date: 08/12/2016 CLINICAL DATA:  Chest pain and shortness of Breath EXAM: CHEST  2 VIEW COMPARISON:  06/20/2016 FINDINGS: Cardiac shadow is stable. Mild vascular congestion is seen. No focal infiltrate or sizable effusion is noted. No bony abnormality is seen. IMPRESSION: Mild vascular congestion. Electronically Signed   By: Inez Catalina M.D.   On: 08/12/2016 08:58    ECG & Cardiac Imaging    RSR, 65, 1st deg AVB, LAD, LAFB, RBBB  Assessment & Plan    1.  Unstable Angina/CAD:  Pt with a prior h/o CAD s/p stenting x 2 (unknown vessels) @ [facility] in 2014.  Since early April, he has been experiencing progressive exertional chest  discomfort and dyspnea.  Chest pressure became more frequent over the weekend, prompting ER visit.  ECG non-acute.  Trops nl. Creat mildly elevated @ 1.37 following gentle diuresis. Echo now shows mild reduction in EF to 45-50% (prev 50-55% 06/2015). Given unstable symptoms, I suspect he will be best served with diagnostic catheterization.  INR 1.69 today. Hold coumadin.  The patient understands that risks include but are not limited to stroke (1 in 1000), death (1 in 32), kidney failure [usually temporary] (1 in 500), bleeding (1 in 200), allergic reaction [possibly serious] (1 in 200), and agrees to proceed.  Cont asa,  blocker, statin, acei.  2.  Chronic combined syst/diast CHF/NICM:  Pt dx with NICM in the late 90's with subsequent improvement in LV fxn.  As above, echo in 06/2015 showed EF of 50-55%.  He has been followed closely by K. Andree Elk, MD @ [facility].  He has had progressive DOE @ home.  No orthopnea/PND/edema.  Wt has been stable @ 201-202 lbs, though he recently noted 204 lbs the other day.  CXR here notable for vascular congestion.  [name] minus 800 ml since admission.  Creat up slightly after IV lasix.  Appears to be euvolemic on exam.  Cont  blocker, acei, and PO lasix.  Of note, spiro was ordered on admission.  Pt has h/o gynecomastia when on this and he says that he is no longer taking it.  I have d/c'd.  We may wish to consider eplerenone as a substitute.  3.  Essential HTN:  Stable on  blocker, acei, lasix.  4.  HL:  On  Simva.  No recent lipids available.  5.  CKD III:  Creat up slightly in setting of iv diuresis  yesterday.  Follow.  6.  PAF:  In sinus currently.  He describes palpitations recently - he isn't sure if it's his afib or not.  Cont  blocker. Hold coumadin in setting of above.  No prior h/o CVA.  Trops nl.  Will not bridge w/ heparin.  Signed, [name], NP 08/13/2016, 11:05 AM

## 2016-08-14 NOTE — Progress Notes (Signed)
Patient back from cardiac cath.  Right wrist without hematoma.  Patient resting with family at bedside.

## 2016-08-15 ENCOUNTER — Inpatient Hospital Stay (HOSPITAL_COMMUNITY)
Admission: AD | Admit: 2016-08-15 | Discharge: 2016-08-20 | DRG: 247 | Disposition: A | Discharge status: Home / Self Care | Payer: Medicare HMO | Source: Other Acute Inpatient Hospital | Attending: Internal Medicine | Admitting: Internal Medicine

## 2016-08-15 ENCOUNTER — Encounter: Payer: Self-pay | Admitting: Internal Medicine

## 2016-08-15 DIAGNOSIS — I5042 Chronic combined systolic (congestive) and diastolic (congestive) heart failure: Secondary | ICD-10-CM | POA: Diagnosis present

## 2016-08-15 DIAGNOSIS — I480 Paroxysmal atrial fibrillation: Secondary | ICD-10-CM | POA: Diagnosis present

## 2016-08-15 DIAGNOSIS — E1122 Type 2 diabetes mellitus with diabetic chronic kidney disease: Secondary | ICD-10-CM | POA: Diagnosis present

## 2016-08-15 DIAGNOSIS — I4510 Unspecified right bundle-branch block: Secondary | ICD-10-CM | POA: Diagnosis present

## 2016-08-15 DIAGNOSIS — I481 Persistent atrial fibrillation: Secondary | ICD-10-CM | POA: Diagnosis not present

## 2016-08-15 DIAGNOSIS — T82855A Stenosis of coronary artery stent, initial encounter: Secondary | ICD-10-CM | POA: Diagnosis present

## 2016-08-15 DIAGNOSIS — I200 Unstable angina: Secondary | ICD-10-CM | POA: Diagnosis present

## 2016-08-15 DIAGNOSIS — E039 Hypothyroidism, unspecified: Secondary | ICD-10-CM | POA: Diagnosis present

## 2016-08-15 DIAGNOSIS — N179 Acute kidney failure, unspecified: Secondary | ICD-10-CM | POA: Diagnosis not present

## 2016-08-15 DIAGNOSIS — I5021 Acute systolic (congestive) heart failure: Secondary | ICD-10-CM | POA: Diagnosis present

## 2016-08-15 DIAGNOSIS — I10 Essential (primary) hypertension: Secondary | ICD-10-CM

## 2016-08-15 DIAGNOSIS — N183 Chronic kidney disease, stage 3 (moderate): Secondary | ICD-10-CM | POA: Diagnosis present

## 2016-08-15 DIAGNOSIS — Y831 Surgical operation with implant of artificial internal device as the cause of abnormal reaction of the patient, or of later complication, without mention of misadventure at the time of the procedure: Secondary | ICD-10-CM | POA: Diagnosis not present

## 2016-08-15 DIAGNOSIS — C9211 Chronic myeloid leukemia, BCR/ABL-positive, in remission: Secondary | ICD-10-CM | POA: Diagnosis present

## 2016-08-15 DIAGNOSIS — I5043 Acute on chronic combined systolic (congestive) and diastolic (congestive) heart failure: Secondary | ICD-10-CM | POA: Diagnosis not present

## 2016-08-15 DIAGNOSIS — I25110 Atherosclerotic heart disease of native coronary artery with unstable angina pectoris: Principal | ICD-10-CM | POA: Diagnosis present

## 2016-08-15 DIAGNOSIS — C9210 Chronic myeloid leukemia, BCR/ABL-positive, not having achieved remission: Secondary | ICD-10-CM | POA: Diagnosis present

## 2016-08-15 DIAGNOSIS — I130 Hypertensive heart and chronic kidney disease with heart failure and stage 1 through stage 4 chronic kidney disease, or unspecified chronic kidney disease: Secondary | ICD-10-CM | POA: Diagnosis present

## 2016-08-15 DIAGNOSIS — I25118 Atherosclerotic heart disease of native coronary artery with other forms of angina pectoris: Secondary | ICD-10-CM

## 2016-08-15 DIAGNOSIS — Z955 Presence of coronary angioplasty implant and graft: Secondary | ICD-10-CM

## 2016-08-15 DIAGNOSIS — I2510 Atherosclerotic heart disease of native coronary artery without angina pectoris: Secondary | ICD-10-CM | POA: Diagnosis not present

## 2016-08-15 DIAGNOSIS — R0600 Dyspnea, unspecified: Secondary | ICD-10-CM | POA: Diagnosis present

## 2016-08-15 DIAGNOSIS — E7800 Pure hypercholesterolemia, unspecified: Secondary | ICD-10-CM | POA: Diagnosis not present

## 2016-08-15 DIAGNOSIS — E785 Hyperlipidemia, unspecified: Secondary | ICD-10-CM | POA: Diagnosis present

## 2016-08-15 DIAGNOSIS — R0602 Shortness of breath: Secondary | ICD-10-CM

## 2016-08-15 HISTORY — DX: Migraine, unspecified, not intractable, without status migrainosus: G43.909

## 2016-08-15 HISTORY — DX: Gastro-esophageal reflux disease without esophagitis: K21.9

## 2016-08-15 HISTORY — DX: Blindness, one eye, unspecified eye: H54.40

## 2016-08-15 HISTORY — DX: Type 2 diabetes mellitus without complications: E11.9

## 2016-08-15 HISTORY — DX: Unspecified osteoarthritis, unspecified site: M19.90

## 2016-08-15 LAB — BASIC METABOLIC PANEL
ANION GAP: 8 (ref 5–15)
BUN: 26 mg/dL — ABNORMAL HIGH (ref 6–20)
CALCIUM: 9 mg/dL (ref 8.9–10.3)
CO2: 25 mmol/L (ref 22–32)
Chloride: 104 mmol/L (ref 101–111)
Creatinine, Ser: 1.56 mg/dL — ABNORMAL HIGH (ref 0.61–1.24)
GFR, EST AFRICAN AMERICAN: 47 mL/min — AB (ref >60)
GFR, EST NON AFRICAN AMERICAN: 40 mL/min — AB (ref >60)
Glucose, Bld: 265 mg/dL — ABNORMAL HIGH (ref 65–99)
Potassium: 4.5 mmol/L (ref 3.5–5.1)
SODIUM: 137 mmol/L (ref 135–145)

## 2016-08-15 LAB — PROTIME-INR
INR: 1.46
Prothrombin Time: 17.9 seconds — ABNORMAL HIGH (ref 11.4–15.2)

## 2016-08-15 LAB — CBC
HCT: 30.7 % — ABNORMAL LOW (ref 40.0–52.0)
HEMOGLOBIN: 10.3 g/dL — AB (ref 13.0–18.0)
MCH: 27 pg (ref 26.0–34.0)
MCHC: 33.7 g/dL (ref 32.0–36.0)
MCV: 80 fL (ref 80.0–100.0)
PLATELETS: 187 10*3/uL (ref 150–440)
RBC: 3.84 MIL/uL — AB (ref 4.40–5.90)
RDW: 17.2 % — ABNORMAL HIGH (ref 11.5–14.5)
WBC: 10.1 10*3/uL (ref 3.8–10.6)

## 2016-08-15 LAB — HEPARIN LEVEL (UNFRACTIONATED)
HEPARIN UNFRACTIONATED: 0.35 [IU]/mL (ref 0.30–0.70)
HEPARIN UNFRACTIONATED: 0.67 [IU]/mL (ref 0.30–0.70)

## 2016-08-15 LAB — GLUCOSE, CAPILLARY
GLUCOSE-CAPILLARY: 239 mg/dL — AB (ref 65–99)
GLUCOSE-CAPILLARY: 204 mg/dL — AB (ref 65–99)
GLUCOSE-CAPILLARY: 204 mg/dL — AB (ref 65–99)
Glucose-Capillary: 150 mg/dL — ABNORMAL HIGH (ref 65–99)

## 2016-08-15 MED ORDER — ATORVASTATIN CALCIUM 80 MG PO TABS
80.0000 mg | ORAL_TABLET | Freq: Every day | ORAL | Status: DC
  Administered 2016-08-15 – 2016-08-19 (×5): 80 mg via ORAL
  Filled 2016-08-15 (×5): qty 1

## 2016-08-15 MED ORDER — MIRTAZAPINE 15 MG PO TABS
15.0000 mg | ORAL_TABLET | Freq: Every day | ORAL | Status: DC
  Administered 2016-08-15 – 2016-08-19 (×5): 15 mg via ORAL
  Filled 2016-08-15 (×6): qty 1

## 2016-08-15 MED ORDER — BOSUTINIB 100 MG PO TABS
200.0000 mg | ORAL_TABLET | Freq: Every day | ORAL | Status: DC
  Administered 2016-08-16 – 2016-08-19 (×4): 200 mg via ORAL
  Filled 2016-08-15 (×7): qty 2

## 2016-08-15 MED ORDER — ONDANSETRON HCL 4 MG/2ML IJ SOLN
4.0000 mg | Freq: Four times a day (QID) | INTRAMUSCULAR | Status: DC | PRN
  Administered 2016-08-16 – 2016-08-20 (×2): 4 mg via INTRAVENOUS
  Filled 2016-08-15 (×2): qty 2

## 2016-08-15 MED ORDER — LEVOTHYROXINE SODIUM 25 MCG PO TABS
25.0000 ug | ORAL_TABLET | Freq: Every day | ORAL | Status: DC
  Administered 2016-08-16 – 2016-08-20 (×5): 25 ug via ORAL
  Filled 2016-08-15 (×5): qty 1

## 2016-08-15 MED ORDER — ALBUTEROL SULFATE (2.5 MG/3ML) 0.083% IN NEBU: 2.5000 mg | INHALATION_SOLUTION | Freq: Four times a day (QID) | RESPIRATORY_TRACT | Status: DC | PRN

## 2016-08-15 MED ORDER — FAMOTIDINE 20 MG PO TABS
20.0000 mg | ORAL_TABLET | Freq: Every day | ORAL | Status: DC
  Administered 2016-08-15 – 2016-08-19 (×5): 20 mg via ORAL
  Filled 2016-08-15 (×5): qty 1

## 2016-08-15 MED ORDER — LOPERAMIDE HCL 2 MG PO CAPS
2.0000 mg | ORAL_CAPSULE | Freq: Four times a day (QID) | ORAL | Status: DC | PRN
  Administered 2016-08-16: 2 mg via ORAL
  Filled 2016-08-15: qty 1

## 2016-08-15 MED ORDER — MAGNESIUM OXIDE 400 (241.3 MG) MG PO TABS
400.0000 mg | ORAL_TABLET | Freq: Two times a day (BID) | ORAL | Status: DC
  Administered 2016-08-15 – 2016-08-20 (×10): 400 mg via ORAL
  Filled 2016-08-15 (×10): qty 1

## 2016-08-15 MED ORDER — NITROGLYCERIN 0.4 MG SL SUBL
0.4000 mg | SUBLINGUAL_TABLET | SUBLINGUAL | Status: DC | PRN
  Administered 2016-08-19 (×3): 0.4 mg via SUBLINGUAL
  Filled 2016-08-15: qty 1

## 2016-08-15 MED ORDER — INSULIN GLARGINE 100 UNIT/ML ~~LOC~~ SOLN
50.0000 [IU] | Freq: Every day | SUBCUTANEOUS | Status: DC
  Administered 2016-08-15 – 2016-08-19 (×5): 50 [IU] via SUBCUTANEOUS
  Filled 2016-08-15 (×8): qty 0.5

## 2016-08-15 MED ORDER — PANTOPRAZOLE SODIUM 40 MG PO TBEC
40.0000 mg | DELAYED_RELEASE_TABLET | Freq: Every day | ORAL | Status: DC
  Administered 2016-08-16 – 2016-08-20 (×5): 40 mg via ORAL
  Filled 2016-08-15 (×4): qty 1

## 2016-08-15 MED ORDER — HEPARIN (PORCINE) IN NACL 100-0.45 UNIT/ML-% IJ SOLN: 1100.0000 [IU]/h | INTRAMUSCULAR | Status: DC

## 2016-08-15 MED ORDER — INSULIN ASPART 100 UNIT/ML ~~LOC~~ SOLN
0.0000 [IU] | Freq: Three times a day (TID) | SUBCUTANEOUS | Status: DC
  Administered 2016-08-16 (×2): 3 [IU] via SUBCUTANEOUS
  Administered 2016-08-17: 5 [IU] via SUBCUTANEOUS
  Administered 2016-08-17: 2 [IU] via SUBCUTANEOUS
  Administered 2016-08-18: 5 [IU] via SUBCUTANEOUS
  Administered 2016-08-18 (×2): 2 [IU] via SUBCUTANEOUS
  Administered 2016-08-19 – 2016-08-20 (×3): 3 [IU] via SUBCUTANEOUS

## 2016-08-15 MED ORDER — FUROSEMIDE 20 MG PO TABS
20.0000 mg | ORAL_TABLET | Freq: Every day | ORAL | Status: DC
  Administered 2016-08-16 – 2016-08-17 (×2): 20 mg via ORAL
  Filled 2016-08-15 (×2): qty 1

## 2016-08-15 MED ORDER — METOPROLOL SUCCINATE ER 50 MG PO TB24
50.0000 mg | ORAL_TABLET | ORAL | Status: DC
  Administered 2016-08-16 – 2016-08-20 (×5): 50 mg via ORAL
  Filled 2016-08-15 (×5): qty 1

## 2016-08-15 MED ORDER — ASPIRIN EC 81 MG PO TBEC
81.0000 mg | DELAYED_RELEASE_TABLET | ORAL | Status: DC
  Administered 2016-08-16 – 2016-08-20 (×4): 81 mg via ORAL
  Filled 2016-08-15 (×5): qty 1

## 2016-08-15 MED ORDER — ALLOPURINOL 300 MG PO TABS
300.0000 mg | ORAL_TABLET | Freq: Every day | ORAL | Status: DC
  Administered 2016-08-16 – 2016-08-20 (×5): 300 mg via ORAL
  Filled 2016-08-15 (×5): qty 1

## 2016-08-15 MED ORDER — HEPARIN (PORCINE) IN NACL 100-0.45 UNIT/ML-% IJ SOLN
800.0000 [IU]/h | INTRAMUSCULAR | Status: DC
  Administered 2016-08-16: 950 [IU]/h via INTRAVENOUS
  Administered 2016-08-17 – 2016-08-18 (×2): 800 [IU]/h via INTRAVENOUS
  Filled 2016-08-15 (×3): qty 250

## 2016-08-15 NOTE — Progress Notes (Signed)
ANTICOAGULATION CONSULT NOTE - Initial Consult  Pharmacy Consult for Heparin Indication: atrial fibrillation  Allergies  Allergen Reactions  . Clopidogrel Anaphylaxis    Other reaction(s): ANAPHYLAXIS;altered mental status;  (electrolytes "out of wack" and confusion per family; THEY DO NOT REMEMBER ANY OTHER REACTION)- MSB 10/10/15  . Ciprofloxacin Itching  . Lyrica [Pregabalin] Itching  . Spironolactone     Other reaction(s): Other (See Comments) gynecomastia  . Plavix [Clopidogrel Bisulfate] Anxiety    Patient Measurements: Height: 5\' 4"  (162.6 cm) Weight: 197 lb 3.2 oz (89.4 kg) IBW/kg (Calculated) : 59.2 Heparin Dosing Weight: 78.6 kg  Vital Signs: Temp: 98.8 F (37.1 C) (05/31 0750) Temp Source: Oral (05/31 0750) BP: 139/59 (05/31 0750) Pulse Rate: 68 (05/31 0750)  Labs:  Recent Labs  08/12/16 1442 08/12/16 1955 08/13/16 0202 08/14/16 0520 08/14/16 0521 08/14/16 1931 08/15/16 0600 08/15/16 1204  HGB  --   --   --   --   --  11.4* 10.3*  --   HCT  --   --   --   --   --  34.6* 30.7*  --   PLT  --   --   --   --   --  190 187  --   APTT  --   --   --   --   --  37*  --   --   LABPROT  --   --  20.1* 18.5*  --   --  17.9*  --   INR  --   --  1.69 1.52  --   --  1.46  --   HEPARINUNFRC  --   --   --   --   --   --   --  0.35  CREATININE  --   --  1.37*  --  1.56*  --  1.56*  --   TROPONINI <0.03 <0.03 <0.03  --   --   --   --   --     Estimated Creatinine Clearance: 38.1 mL/min (A) (by C-G formula based on SCr of 1.56 mg/dL (H)).   Medical History: Past Medical History:  Diagnosis Date  . Asthma   . BPH (benign prostatic hyperplasia)   . Breast asymmetry    Left breast is larger, present for several years.  [name] CAD (coronary artery disease)    a. Cath in the late 90's - reportedly ok;  b. 09/2012 Cath/PCI:  2 stents placed - unknown vessels.  . Chronic combined systolic (congestive) and diastolic (congestive) heart failure ([place])    a. Previously reduced  EF-->50% by echo in 2012;  b. 06/2015 Echo: EF 50-55%  c. 07/2016 Echo: EF 45-50%.  . CKD (chronic kidney disease), stage III   . CML (chronic myelocytic leukemia) ([place])   . Diabetes mellitus without complication ([place])   . Gout   . Hyperlipemia   . Hypertension   . Hypothyroidism   . Iron deficiency anemia   . NICM (nonischemic cardiomyopathy) ([place])    a. Previously reduced EF-->50% by echo in 2012;  b. 06/2015 Echo: EF 50-55%, Gr2 DD, mild MR, mildly dil LA, Ao sclerosis, mild TR;  c. 07/2016 Echo: EF 45-50%, ? inf HK, mild MR, mildly dil LA.  [name] PAF (paroxysmal atrial fibrillation) (HCC)    a. ? Dx 2014-->s/p DCCV;  b. CHA2DS2VASc = 6-->chronic coumadin.  [name] RBBB     Assessment: 83 yo male s/p cath to start on heparin drip for AFib 8  hours after radial sheath removal. Warfarin currently on hold for cath and possible surgical evaluation. Per specials TR band to be fully off by 1840. Patient currently receiving heparin at 1100 units/hr.    Goal of Therapy:  Heparin level 0.3-0.7 units/ml Monitor platelets by anticoagulation protocol: Yes   Plan:  Patient scheduled for transfer to [facility] for CABG. Continue heparin drip at 1100units/hr. Will obtain follow-up anti-Xa level at 2000.   Pharmacy will continue to follow.   Simpson,Michael L 08/15/2016,1:30 PM

## 2016-08-15 NOTE — Progress Notes (Signed)
ANTICOAGULATION CONSULT NOTE - Follow Up Consult  Pharmacy Consult for Heparin Indication: chest pain/ACS and atrial fibrillation  Allergies  Allergen Reactions  . Clopidogrel Anaphylaxis    Other reaction(s): ANAPHYLAXIS;altered mental status;  (electrolytes "out of wack" and confusion per family; THEY DO NOT REMEMBER ANY OTHER REACTION)- MSB 10/10/15  . Ciprofloxacin Itching  . Lyrica [Pregabalin] Itching  . Spironolactone     Other reaction(s): Other (See Comments) gynecomastia  . Plavix [Clopidogrel Bisulfate] Anxiety    Patient Measurements: Height: 5\' 6"  (167.6 cm) Weight: 202 lb 12.8 oz (92 kg) IBW/kg (Calculated) : 63.8 Heparin Dosing Weight: 79 kg  Vital Signs: Temp: 98.3 F (36.8 C) (05/31 2002) Temp Source: Oral (05/31 2002) BP: 142/66 (05/31 2002) Pulse Rate: 57 (05/31 2002)  Labs:  Recent Labs  08/13/16 0202 08/14/16 0520 08/14/16 0521 08/14/16 1931 08/15/16 0600 08/15/16 1204 08/15/16 1930  HGB  --   --   --  11.4* 10.3*  --   --   HCT  --   --   --  34.6* 30.7*  --   --   PLT  --   --   --  190 187  --   --   APTT  --   --   --  37*  --   --   --   LABPROT 20.1* 18.5*  --   --  17.9*  --   --   INR 1.69 1.52  --   --  1.46  --   --   HEPARINUNFRC  --   --   --   --   --  0.35 0.67  CREATININE 1.37*  --  1.56*  --  1.56*  --   --   TROPONINI <0.03  --   --   --   --   --   --     Estimated Creatinine Clearance: 40.1 mL/min (A) (by C-G formula based on SCr of 1.56 mg/dL (H)).  Assessment:   83 yr old male transferred from [facility] to [facility] for consideration for CABG.  S/p cardia cath on 08/14/16. Dr. Prescott Gum recommends PCI rather than CABG due to comorbid medical problems.   On Coumadin prior to admission for afib.  INR down to 1.46 today, last Coumadin dose was 4 mg (usual dose) on 08/12/16.    Heparin level is therapeutic (0.67) tonight on 1100 units/hr.  Up from last level, but expect further decline in INR by am, which may change heparin  requirement.  Goal of Therapy:  Heparin level 0.3-0.7 units/ml Monitor platelets by anticoagulation protocol: Yes   Plan:   Continue heparin drip at 1100 units/hr.  Daily heparin level and CBC.  PT/INR in am.  Follow up plans for possible PCI.  Coumadin on hold.  Arty Baumgartner, [place] Pager: [phone] 08/15/2016,9:42 PM

## 2016-08-15 NOTE — Progress Notes (Signed)
Inpatient Diabetes Program Recommendations  AACE/ADA: New Consensus Statement on Inpatient Glycemic Control (2015)  Target Ranges:  Prepandial:   less than 140 mg/dL      Peak postprandial:   less than 180 mg/dL (1-2 hours)      Critically ill patients:  140 - 180 mg/dL   Lab Results  Component Value Date   GLUCAP 239 (H) 08/15/2016    Review of Glycemic Control:  Results for JUQUAN, REZNICK (MRN [id number]) as of 08/15/2016 11:19  Ref. Range 08/13/2016 07:40 08/13/2016 12:48 08/13/2016 16:46 08/13/2016 20:53 08/14/2016 07:34 08/14/2016 11:16 08/14/2016 15:31 08/14/2016 21:21 08/15/2016 07:36  Glucose-Capillary Latest Ref Range: 65 - 99 mg/dL 104 (H) 176 (H) 188 (H) 181 (H) 196 (H) 155 (H) 268 (H) 244 (H) 239 (H)   Diabetes history: Type 2 diabetes Outpatient Diabetes medications: Lantus 50 units daily,  Current orders for Inpatient glycemic control:  Novolog sensitive tid with meals and HS, Lantus 30 units q HS  Inpatient Diabetes Program Recommendations:   Please consider increasing Lantus to 40 units q HS.  Also consider increasing Novolog correction to moderate tid with meals and HS.  Thanks, Adah Perl, RN, BC-ADM Inpatient Diabetes Coordinator Pager [phone] (8a-5p)

## 2016-08-15 NOTE — Progress Notes (Signed)
Progress Note  Patient Name: Lucas Caldwell Date of Encounter: 08/15/2016  Primary Cardiologist: K. Andree Elk, MD - [facility]  Subjective   No chest pain or sob overnight.  Somewhat confused last night by his report but easily re-oriented.  Inpatient Medications    Scheduled Meds: . allopurinol  300 mg Oral Daily  . aspirin EC  81 mg Oral BH-q7a  . bosutinib  200 mg Oral Q supper  . famotidine  20 mg Oral QHS  . insulin aspart  0-5 Units Subcutaneous QHS  . insulin aspart  0-9 Units Subcutaneous TID WC  . insulin glargine  30 Units Subcutaneous QHS  . levothyroxine  25 mcg Oral QAC breakfast  . magnesium oxide  400 mg Oral BID  . metoprolol succinate  50 mg Oral BH-q7a  . mirtazapine  15 mg Oral QHS  . multivitamin with minerals   Oral BH-q7a  . pantoprazole  40 mg Oral Daily  . pregabalin  50 mg Oral TID  . simvastatin  20 mg Oral QPM  . sodium chloride flush  3 mL Intravenous Q12H  . sodium chloride flush  3 mL Intravenous Q12H  . Warfarin - Physician Dosing Inpatient   Does not apply q1800   Continuous Infusions: . sodium chloride    . heparin 1,100 Units/hr (08/15/16 0302)   PRN Meds: sodium chloride, acetaminophen, albuterol, loperamide, sodium chloride flush   Vital Signs    Vitals:   08/14/16 1831 08/14/16 2016 08/15/16 0416 08/15/16 0750  BP: (!) 152/55 127/62 (!) 123/44 (!) 139/59  Pulse: (!) 54 (!) 58 62 68  Resp: 15 18 16 18   Temp:  97.9 F (36.6 C) 97.5 F (36.4 C) 98.8 F (37.1 C)  TempSrc:  Oral Oral Oral  SpO2: 97% 100% 94% 98%  Weight:      Height:        Intake/Output Summary (Last 24 hours) at 08/15/16 1132 Last data filed at 08/15/16 0415  Gross per 24 hour  Intake              760 ml  Output              650 ml  Net              110 ml   Filed Weights   08/12/16 0831 08/14/16 0453  Weight: 204 lb (92.5 kg) 197 lb 3.2 oz (89.4 kg)    Physical Exam   GEN: Well nourished, well developed, in no acute distress.  HEENT: Grossly normal.   Neck: Supple, JVP 12-14 mmHg, no carotid bruits, or masses. Cardiac: RRR, no murmurs, rubs, or gallops. No clubbing, cyanosis, trace bilat LE edema.  Radials/DP/PT 2+ and equal bilaterally.  Respiratory:  Respirations regular and unlabored, clear to auscultation bilaterally. GI: Soft, nontender, nondistended, BS + x 4. MS: no deformity or atrophy. Skin: warm and dry, no rash. Neuro:  Strength and sensation are intact. Psych: AAOx3.  Normal affect.  Labs    Chemistry  Recent Labs Lab 08/13/16 0202 08/14/16 0521 08/15/16 0600  NA 138 136 137  K 3.9 4.1 4.5  CL 105 103 104  CO2 26 24 25   GLUCOSE 96 223* 265*  BUN 24* 31* 26*  CREATININE 1.37* 1.56* 1.56*  CALCIUM 9.3 9.3 9.0  GFRNONAA 47* 40* 40*  GFRAA 55* 47* 47*  ANIONGAP 7 9 8      Hematology  Recent Labs Lab 08/12/16 0851 08/14/16 1931 08/15/16 0600  WBC 9.6 8.8 10.1  RBC 3.80* 4.30* 3.84*  HGB 10.0* 11.4* 10.3*  HCT 30.0* 34.6* 30.7*  MCV 79.0* 80.5 80.0  MCH 26.2 26.4 27.0  MCHC 33.2 32.8 33.7  RDW 17.4* 18.0* 17.2*  PLT 170 190 187    Cardiac Enzymes  Recent Labs Lab 08/12/16 0851 08/12/16 1442 08/12/16 1955 08/13/16 0202  TROPONINI <0.03 <0.03 <0.03 <0.03     BNP  Recent Labs Lab 08/12/16 0851  BNP 95.0     Radiology    No results found.  Telemetry    RSR - Personally Reviewed  Cardiac Studies   Cardiac Catheterization  5.30.2018 Conclusions: 1. Significant multivessel coronary artery disease, including 70% ostial LMCA stenosis (with catheter dampening) and 95% proximal/mid RCA lesion. 2. Patent stents in the mid LCx and mid RCA. 3. Upper normal left ventricular filling pressure.  Recommendations: 1. Given LMCA and RCA disease in the setting of diabetes mellitus, surgical consultation for CABG should be considered. However, if comorbidities preclude surgical intervention, PCI to unprotected LMCA and mid RCA could be considered. 2. Medical therapy, including gentle  hydration, given chronic kidney disease. 3. Aggressive secondary prevention. _____________  2D Echocardiogram 5.29.2018  Study Conclusions   - Left ventricle: The cavity size was mildly dilated. There was   mild concentric hypertrophy. Systolic function was mildly   reduced. The estimated ejection fraction was in the range of 45%   to 50%. Possible hypokinesis of the inferior myocardium. Left   ventricular diastolic function parameters were normal. - Mitral valve: There was mild regurgitation. - Left atrium: The atrium was mildly dilated. - Pulmonary arteries: Systolic pressure could not be accurately   estimated.  Patient Profile     83 y.o. male with a history of NICM, chronic combined syst/diast CHF, CKD, HTN, HL, DM, CML, IDA, and hypothyroidism, admitted 5/28 with chest pain and dyspnea, and now found to have severe LM and RCA dzs.  Assessment & Plan    1.  USA/CAD: s/p prior LAD and RCA stenting @ [facility].  Admitted with a 2+ mo h/o progressive DOE and exertional angina.  Trops nl.  Cath yesterday revealed 70% ostial LM and prox/mid RCA dzs.  Surgical eval felt to be best option.  Pt has previously been followed @ [facility] but prefers to be tx to [facility].  I have also discussed this with his family this am and they are all in agreement with that plan.  Cont asa, statin, heparin,  blocker.   2.  Chronic combined syst/diast CHF/NICM:  EF 45-50% by echo this admission.  JVP up this am in setting of post-cath hydration.  Resume prior home dose of lasix.  Cont  blocker.  [name] also on enalapril, eplerenone, and digoxin @ home.  These have been on hold due to elevated creat and need for contrast.  I will cont to hold for now.  Creat stable.  3.  Essential HTN:  Stable.  4.  HL:  On statin.  Check lipids.  5.  CKD III: stable post-cath. Cont to hold acei/eplerenone and watch for CIN.  6. PAF:  No AF this admission.  Coumadin remains on hold - bridging with heparin in setting of #1.  7.  DM  II:  Per IM.  Cont insulin.   Signed, [name], NP  08/15/2016, 11:32 AM

## 2016-08-15 NOTE — Discharge Summary (Signed)
Lucas Caldwell, is a 83 y.o. male  DOB October 08, 1933  MRN [id number].  Admission date:  08/12/2016  Admitting Physician  Baxter Hire, MD  Discharge Date:  08/15/2016   Primary MD  Abran Duke, MD  Recommendations for primary care physician for things to follow:   Transferred to [facility] for CABG.   Admission Diagnosis  Angina of effort ([place]) [O29.4] Acute systolic congestive heart failure (HCC) [I50.21]   Discharge Diagnosis  Angina of effort ([place]) [T65.4] Acute systolic congestive heart failure (HCC) [I50.21]    Active Problems:   Exertional dyspnea   Acute systolic congestive heart failure (HCC)      Past Medical History:  Diagnosis Date  . Asthma   . BPH (benign prostatic hyperplasia)   . Breast asymmetry    Left breast is larger, present for several years.  [name] CAD (coronary artery disease)    a. Cath in the late 90's - reportedly ok;  b. 09/2012 Cath/PCI:  2 stents placed - unknown vessels.  . Chronic combined systolic (congestive) and diastolic (congestive) heart failure ([place])    a. Previously reduced EF-->50% by echo in 2012;  b. 06/2015 Echo: EF 50-55%  c. 07/2016 Echo: EF 45-50%.  . CKD (chronic kidney disease), stage III   . CML (chronic myelocytic leukemia) ([place])   . Diabetes mellitus without complication ([place])   . Gout   . Hyperlipemia   . Hypertension   . Hypothyroidism   . Iron deficiency anemia   . NICM (nonischemic cardiomyopathy) ([place])    a. Previously reduced EF-->50% by echo in 2012;  b. 06/2015 Echo: EF 50-55%, Gr2 DD, mild MR, mildly dil LA, Ao sclerosis, mild TR;  c. 07/2016 Echo: EF 45-50%, ? inf HK, mild MR, mildly dil LA.  [name] PAF (paroxysmal atrial fibrillation) (HCC)    a. ? Dx 2014-->s/p DCCV;  b. CHA2DS2VASc = 6-->chronic coumadin.  [name] RBBB     Past Surgical History:   Procedure Laterality Date  . CARDIAC CATHETERIZATION  09/2012   2 stents  . HERNIA REPAIR    . LEFT HEART CATH AND CORONARY ANGIOGRAPHY N/A 08/14/2016   Procedure: Left Heart Cath and Coronary Angiography;  Surgeon: [name], MD;  Location: [facility];  Service: Cardiovascular;  Laterality: N/A;  . TOE AMPUTATION         History of present illness and  Hospital Course:     Kindly see H&P for history of present illness and admission details, please review complete Labs, Consult reports and Test reports for all details in brief  HPI  from the history and physical done on the day of admission 83 year old male patient with coronary artery disease status post PCI 2 in 2014 with normal LV function, diabetes mellitus type 2, paroxysmal atrial fibrillation on warfarin, essential hypertension, chronic kidney disease came and progressive exertional dyspnea and substernal chest tightness  For  1-2 months. Treated to telemetry for unstable angina.   Hospital Course  #1 Exertional dyspnea, chest tightness, symptoms are concerning for unstable angina. Initial EKG showed normal sinus rhythm with first-degree AV block, right bundle branch block. Troponins are negative for 3 times. Chest x-ray at admission showed vascular congestion. Patient seen by cardiology from [facility], echocardiogram was repeated, echocardiogram showed decreased ejection fraction with EF 45-50% with possible inferior wall hypokinesis patient initial INR showed 1.69 shortly held the Coumadin, patient is taken to cardiac catheter yesterday on May 30,, since cardiac catheter showed  multivessel coronary artery disease including 70% ostial LMCA and 95% proximal and mid RCA stenosis. Because of his history of diabetes mellitus type 2, PCA, CAD patient is being transferred to [facility]. Family agreed for this. Continue patient on aspirin, high intensity statins, started on heparin drip.  #2 acute on chronic systolic  heart failure number progressive dyspnea. He initially received IV dose of Lasix, . We hydrated gently after the cardiac catheter because of chronic kidney disease stage III.;  Held Lasix and lisinopril now.   Essential hypertension: Controlled.   #4 proximal atrial fibrillation: Continue heparin drip,CHADSVASC score of at least 6.   Discharge Condition: stable   Follow UP      Discharge Instructions  and  Discharge Medications      Allergies as of 08/15/2016      Reactions   Clopidogrel Anaphylaxis   Other reaction(s): ANAPHYLAXIS;altered mental status;  (electrolytes "out of wack" and confusion per family; THEY DO NOT REMEMBER ANY OTHER REACTION)- MSB 10/10/15   Ciprofloxacin Itching   Lyrica [pregabalin] Itching   Spironolactone    Other reaction(s): Other (See Comments) gynecomastia   Plavix [clopidogrel Bisulfate] Anxiety      Medication List    STOP taking these medications   enalapril 5 MG tablet Commonly known as:  VASOTEC   eplerenone 25 MG tablet Commonly known as:  INSPRA   furosemide 20 MG tablet Commonly known as:  LASIX   warfarin 2 MG tablet Commonly known as:  COUMADIN     TAKE these medications   acetaminophen 500 MG tablet Commonly known as:  TYLENOL Take 500 mg by mouth every 6 (six) hours as needed.   albuterol 108 (90 Base) MCG/ACT inhaler Commonly known as:  PROVENTIL HFA;VENTOLIN HFA Inhale 2 puffs into the lungs every 6 (six) hours as needed for wheezing or shortness of breath.   allopurinol 300 MG tablet Commonly known as:  ZYLOPRIM Take 300 mg by mouth daily.   aspirin EC 81 MG tablet Take 81 mg by mouth every morning.   bosutinib 100 MG tablet Commonly known as:  BOSULIF Take 200 mg by mouth daily with supper. Take with food.   digoxin 0.125 MG tablet Commonly known as:  LANOXIN Take 125 mcg by mouth daily.   famotidine 20 MG tablet Commonly known as:  PEPCID Take 20 mg by mouth at bedtime.   heparin 100-0.45  UNIT/ML-% infusion Inject 1,100 Units/hr into the vein continuous.   hydrocortisone 2.5 % ointment Apply topically 3 (three) times daily.   LANTUS SOLOSTAR 100 UNIT/ML Solostar Pen Generic drug:  Insulin Glargine Inject 20 Units into the skin at bedtime. What changed:  how much to take   levothyroxine 25 MCG tablet Commonly known as:  SYNTHROID, LEVOTHROID Take 25 mcg by mouth daily before breakfast.   loperamide 2 MG tablet Commonly known as:  IMODIUM A-D Take 2 mg by mouth 4 (four) times daily as needed for diarrhea or loose stools.   magnesium oxide 400 MG tablet Commonly known as:  MAG-OX Take 400 mg by mouth 2 (two) times daily.   metoprolol succinate 50 MG 24 hr tablet Commonly known as:  TOPROL-XL Take 50 mg by mouth every morning.   mirtazapine 15 MG tablet Commonly known as:  REMERON Take 15 mg by mouth at bedtime.   MULTIVITAMIN ADULTS 50+ PO Take 1 tablet by mouth every morning.   nepafenac 0.1 % ophthalmic suspension Commonly known as:  [place] 1 drop into both eyes  3 (three) times daily as needed.   omeprazole 40 MG capsule Commonly known as:  PRILOSEC Take 40 mg by mouth every morning.   OPTICHAMBER ADVANTAGE Misc 1 each by Other route once.   pregabalin 50 MG capsule Commonly known as:  LYRICA Take 50 mg by mouth 3 (three) times daily.   simvastatin 20 MG tablet Commonly known as:  ZOCOR Take 20 mg by mouth every evening.         Diet and Activity recommendation: See Discharge Instructions above    Consults obtained - cardiology   Major procedures and Radiology Reports - PLEASE review detailed and final reports for all details, in brief -      Dg Chest 2 View  Result Date: 08/12/2016 CLINICAL DATA:  Chest pain and shortness of Breath EXAM: CHEST  2 VIEW COMPARISON:  06/20/2016 FINDINGS: Cardiac shadow is stable. Mild vascular congestion is seen. No focal infiltrate or sizable effusion is noted. No bony abnormality is seen.  IMPRESSION: Mild vascular congestion. Electronically Signed   By: Inez Catalina M.D.   On: 08/12/2016 08:58    Micro Results     No results found for this or any previous visit (from the past 240 hour(s)).     Today   Subjective:   Rexene Alberts today Is stable for transfer to [facility] For CABG eval.  Objective:   Blood pressure (!) 139/59, pulse 68, temperature 98.8 F (37.1 C), temperature source Oral, resp. rate 18, height 5\' 4"  (1.626 m), weight 89.4 kg (197 lb 3.2 oz), SpO2 98 %.   Intake/Output Summary (Last 24 hours) at 08/15/16 1124 Last data filed at 08/15/16 0415  Gross per 24 hour  Intake              760 ml  Output              650 ml  Net              110 ml    Exam Awake Alert, Oriented x 3, No new F.N deficits, Normal affect [place].AT,PERRAL Supple Neck,No JVD, No cervical lymphadenopathy appriciated.  Symmetrical Chest wall movement, Good air movement bilaterally, CTAB RRR,No Gallops,Rubs or new Murmurs, No Parasternal Heave +ve B.Sounds, Abd Soft, Non tender, No organomegaly appriciated, No rebound -guarding or rigidity. No Cyanosis, Clubbing or edema, No new Rash or bruise  Data Review   CBC w Diff:  Lab Results  Component Value Date   WBC 10.1 08/15/2016   HGB 10.3 (L) 08/15/2016   HGB 11.7 (L) 05/18/2012   HCT 30.7 (L) 08/15/2016   HCT 36.1 (L) 05/18/2012   PLT 187 08/15/2016   PLT 358 05/18/2012   LYMPHOPCT 16.1 03/16/2012   MONOPCT 6 05/18/2012   MONOPCT 10.4 03/16/2012   EOSPCT 0.9 03/16/2012   BASOPCT 0.2 03/16/2012    CMP:  Lab Results  Component Value Date   NA 137 08/15/2016   NA 134 (L) 05/18/2012   K 4.5 08/15/2016   K 4.2 05/18/2012   CL 104 08/15/2016   CL 103 05/18/2012   CO2 25 08/15/2016   CO2 25 05/18/2012   BUN 26 (H) 08/15/2016   BUN 38 (H) 05/18/2012   CREATININE 1.56 (H) 08/15/2016   CREATININE 1.26 05/18/2012   PROT 8.5 (H) 05/17/2012   ALBUMIN 4.0 05/17/2012   BILITOT 0.5 05/17/2012   ALKPHOS 99  05/17/2012   AST 24 05/17/2012   ALT 28 05/17/2012  .   Total Time in  preparing paper work, data evaluation and todays exam - 3 minutes  Sanna Porcaro M.D on 08/15/2016 at 11:24 AM    Note: This dictation was prepared with Dragon dictation along with smaller phrase technology. Any transcriptional errors that result from this process are unintentional.

## 2016-08-15 NOTE — Progress Notes (Signed)
  Subjective: Patient examined, coronary arteriograms and 2-D echocardiogram images personally reviewed and counseled with patient and family. I have discussed the patient with his cardiologist, Dr. Saunders Revel [ who performed the catheterization earlier today ] for coordination of care  Very nice 83 year old diabetic nonsmoker with history of CAD status post PCI at [facility] 4 years ago presented to [facility] with chest pain and shortness of breath. He was diagnosed as having unstable angina. Cardiac catheterization via right radial artery demonstrated new lesions with a moderate 70% ostial left main stenosis and 95% proximal RCA stenosis. The patient is currently stable after being transferred to [place].  The patient has chronic myelogenous leukemia CML and is on active chemotherapy. The patient previously was taking daily Gleevec but that has been changed to another agent because of renal toxicity and his chronic renal insufficiency. The patient has significant diabetes and is status post toe amputation. He has had intermittent atrial fibrillation .  His RCA stenosis is severe. Left main stenosis is moderate. Previously placed stents appear to be satisfactory His LVEDP is significantly elevated > 30 mm Hg  With active leukemia on daily chemotherapy and with diabetes at age 77 he would have  very high risk of infection and wound healing problems due to his immune suppression and anti-metabolic therapy. I would not recommend multivessel CABG in this patient. I feel he would benefit best from PCI. Objective: Vital signs in last 24 hours: Temp:  [97.5 F (36.4 C)-98.8 F (37.1 C)] 98.2 F (36.8 C) (05/31 1330) Pulse Rate:  [54-68] 57 (05/31 1330) Cardiac Rhythm: Normal sinus rhythm;Bundle branch block (05/31 1400) Resp:  [15-19] 18 (05/31 1330) BP: (123-155)/(44-65) 139/56 (05/31 1330) SpO2:  [94 %-100 %] 100 % (05/31 1330) Weight:  [202 lb 12.8 oz (92 kg)] 202 lb 12.8 oz  (92 kg) (05/31 1330)  Hemodynamic parameters for last 24 hours:  stable  Intake/Output from previous day: No intake/output data recorded. Intake/Output this shift: No intake/output data recorded.       Exam    General- alert and comfortable lying supine in bed   Lungs- clear without rales, wheezes   Cor- regular rate and rhythm, no murmur , gallop   Abdomen- soft, non-tender   Extremities - warm, non-tender, minimal edema. No hematoma at right wrist cath site   Neuro- oriented, appropriate, no focal weakness   Lab Results:  Recent Labs  08/14/16 1931 08/15/16 0600  WBC 8.8 10.1  HGB 11.4* 10.3*  HCT 34.6* 30.7*  PLT 190 187   BMET:  Recent Labs  08/14/16 0521 08/15/16 0600  NA 136 137  K 4.1 4.5  CL 103 104  CO2 24 25  GLUCOSE 223* 265*  BUN 31* 26*  CREATININE 1.56* 1.56*  CALCIUM 9.3 9.0    PT/INR:  Recent Labs  08/15/16 0600  LABPROT 17.9*  INR 1.46   ABG No results found for: PHART, HCO3, TCO2, ACIDBASEDEF, O2SAT CBG (last 3)   Recent Labs  08/15/16 0736 08/15/16 1137 08/15/16 1745  GLUCAP 239* 204* 150*    Assessment/Plan: S/P  Unstable angina, severe CAD, severe comorbid medical problems which would preclude benefit received from CABG. Recommend PCI.  LOS: 0 days    Tharon Aquas Trigt III 08/15/2016

## 2016-08-15 NOTE — H&P (Signed)
History & Physical - [facility]    Patient ID: Lucas Caldwell MRN: [id number], DOB/AGE: Sep 30, 1933   Admit date: 08/12/2016   Primary Physician: Abran Duke, MD Primary Cardiologist: K. Andree Elk, MD - [facility]  Patient Profile    83 y.o.malewith a history of NICM, chronic combined syst/diast CHF, CKD, HTN, HL, DM, CML, IDA, and hypothyroidism, admitted 5/28 with chest pain and dyspnea, and now found to have severe LM and RCA dzs.  Past Medical History    Past Medical History:  Diagnosis Date  . Asthma   . BPH (benign prostatic hyperplasia)   . Breast asymmetry    Left breast is larger, present for several years.  [name] CAD (coronary artery disease)    a. Cath in the late 90's - reportedly ok;  b. 09/2012 Cath/PCI:  2 stents placed - unknown vessels.  . Chronic combined systolic (congestive) and diastolic (congestive) heart failure ([place])    a. Previously reduced EF-->50% by echo in 2012;  b. 06/2015 Echo: EF 50-55%  c. 07/2016 Echo: EF 45-50%.  . CKD (chronic kidney disease), stage III   . CML (chronic myelocytic leukemia) ([place])   . Diabetes mellitus without complication ([place])   . Gout   . Hyperlipemia   . Hypertension   . Hypothyroidism   . Iron deficiency anemia   . NICM (nonischemic cardiomyopathy) ([place])    a. Previously reduced EF-->50% by echo in 2012;  b. 06/2015 Echo: EF 50-55%, Gr2 DD, mild MR, mildly dil LA, Ao sclerosis, mild TR;  c. 07/2016 Echo: EF 45-50%, ? inf HK, mild MR, mildly dil LA.  [name] PAF (paroxysmal atrial fibrillation) (HCC)    a. ? Dx 2014-->s/p DCCV;  b. CHA2DS2VASc = 6-->chronic coumadin.  [name] RBBB     Past Surgical History:  Procedure Laterality Date  . CARDIAC CATHETERIZATION  09/2012   2 stents  . HERNIA REPAIR    . LEFT HEART CATH AND CORONARY ANGIOGRAPHY N/A 08/14/2016   Procedure: Left Heart Cath and Coronary Angiography;  Surgeon: [name], MD;  Location: [facility];  Service: Cardiovascular;  Laterality: N/A;  . TOE  AMPUTATION       Allergies  Allergies  Allergen Reactions  . Clopidogrel Anaphylaxis    Other reaction(s): ANAPHYLAXIS;altered mental status;  (electrolytes "out of wack" and confusion per family; THEY DO NOT REMEMBER ANY OTHER REACTION)- MSB 10/10/15  . Ciprofloxacin Itching  . Lyrica [Pregabalin] Itching  . Spironolactone     Other reaction(s): Other (See Comments) gynecomastia  . Plavix [Clopidogrel Bisulfate] Anxiety    History of Present Illness    83 y/o ? with a h/o NICM dating back to the late 1990's, CAD (dx 2014  2 stents placed), HTN, HL, DM II, PAF (chronic coumadin), CKD III, CML, IDA, BPH, and hypothyroidism.  [name] followed closely @ [facility] cardiology.  EF previously depressed but normalized by 2012.  Last echo in 06/2015 showed EF of 50-55%.  Last seen by Dr. Andree Elk in March, @ which time he was doing well.  He says that beginning in early April, he began to note increasing DOE.  This has progressed to the point, that he doesn't think he can walk 50 yds w/o dyspnea.  Over the same period of time, he has noted increased frequency of exertional sscp/pressure that resolves with rest.  This is sometimes/often associated with increased HR's (h/o PAF).  Symptoms typically resolve with rest.    On Saturday 5/26, he noted more  frequent palpitations associated with chest pressure.  This was on and off throughout the day.  He slept poorly Saturday night due to chest pressure and dyspnea.  On Sunday morning, he went on to church and felt well, but after returning home, he had recurrent chest pressure.  This was on and off throughout the day Sunday and so on Monday morning, he decided to come into the [facility] ED.  Here, ECG was non-acute (chronic RBBB).  Troponins were nl.  CXR showed mild vascular congestion.  He received one dose of IV lasix and was admitted for further eval.  Creat was up slightly on admission.  It was felt that he would require diagnostic cath.  Home doses of enalapril and  eplerenone were held.  He underwent diagnostic cath on 5/30 revealing severe ostial LM and prox/mid RCA dzs.  It was felt that he would be best served with CT surgery eval.  Though he has been followed @ [facility], he wishes to be transferred to [facility] for CT Surgery eval.  He has been chest pain free since cath.  Home Medications    Prior to Admission medications   Medication Sig Start Date End Date Taking? Authorizing Provider  acetaminophen (TYLENOL) 500 MG tablet Take 500 mg by mouth every 6 (six) hours as needed.   Yes [provider]  albuterol (PROVENTIL HFA;VENTOLIN HFA) 108 (90 BASE) MCG/ACT inhaler Inhale 2 puffs into the lungs every 6 (six) hours as needed for wheezing or shortness of breath. 11/04/14  Yes Ahmed Prima, MD  allopurinol (ZYLOPRIM) 300 MG tablet Take 300 mg by mouth daily.  11/10/15  Yes [provider]  aspirin EC 81 MG tablet Take 81 mg by mouth every morning. 03/05/12  Yes [provider]  bosutinib (BOSULIF) 100 MG tablet Take 200 mg by mouth daily with supper. Take with food.   Yes [provider]  digoxin (LANOXIN) 0.125 MG tablet Take 125 mcg by mouth daily.  11/10/15  Yes [provider]  enalapril (VASOTEC) 5 MG tablet Take 5 mg by mouth daily.  11/22/15  Yes [provider]  eplerenone (INSPRA) 25 MG tablet Take 25 mg by mouth daily.  11/10/15  Yes [provider]  famotidine (PEPCID) 20 MG tablet Take 20 mg by mouth at bedtime.   Yes [provider]  furosemide (LASIX) 20 MG tablet Take 20 mg by mouth daily.  11/10/15  Yes [provider]  hydrocortisone 2.5 % ointment Apply topically 3 (three) times daily. 07/20/16  Yes Triplett, Cari B, FNP  LANTUS SOLOSTAR 100 UNIT/ML Solostar Pen Inject 20 Units into the skin at bedtime. Patient taking differently: Inject 50 Units into the skin at bedtime.  12/11/15  Yes Demetrios Loll, MD  levothyroxine (SYNTHROID, LEVOTHROID) 25 MCG tablet Take 25 mcg by  mouth daily before breakfast.  10/27/15  Yes [provider]  loperamide (IMODIUM A-D) 2 MG tablet Take 2 mg by mouth 4 (four) times daily as needed for diarrhea or loose stools.   Yes [provider]  magnesium oxide (MAG-OX) 400 MG tablet Take 400 mg by mouth 2 (two) times daily.   Yes [provider]  metoprolol succinate (TOPROL-XL) 50 MG 24 hr tablet Take 50 mg by mouth every morning. 12/04/15  Yes [provider]  mirtazapine (REMERON) 15 MG tablet Take 15 mg by mouth at bedtime. 11/02/15  Yes [provider]  Multiple Vitamins-Minerals (MULTIVITAMIN ADULTS 50+ PO) Take 1 tablet by mouth every  morning.   Yes [provider]  nepafenac (NEVANAC) 0.1 % ophthalmic suspension Place 1 drop into both eyes 3 (three) times daily as needed.   Yes [provider]  omeprazole (PRILOSEC) 40 MG capsule Take 40 mg by mouth every morning. 11/10/15  Yes [provider]  pregabalin (LYRICA) 50 MG capsule Take 50 mg by mouth 3 (three) times daily.   Yes [provider]  simvastatin (ZOCOR) 20 MG tablet Take 20 mg by mouth every evening. 08/30/11  Yes [provider]  warfarin (COUMADIN) 2 MG tablet Take 4 mg by mouth at bedtime. 12/04/15  Yes [provider]  heparin 100-0.45 UNIT/ML-% infusion Inject 1,100 Units/hr into the vein continuous. 08/15/16   Epifanio Lesches, MD  Spacer/Aero-Holding Chambers [facility] ADVANTAGE) [place] 1 each by Other route once. 11/04/14   Ahmed Prima, MD    Family History    Family History  Problem Relation Age of Onset  . Brain cancer Father   . Diabetes Sister   . Heart attack Sister     Social History    Social History   Social History  . Marital status: Married    Spouse name: N/A  . Number of children: N/A  . Years of education: N/A   Occupational History  . 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   Social History Main Topics  . Smoking status: Former  Smoker    Packs/day: 2.00    Years: 15.00  . Smokeless tobacco: Never Used     Comment: quit 1979  . Alcohol use No     Comment: previously drank heavily - quit 1979.  . Drug use: Unknown  . Sexual activity: Not on file   Other Topics Concern  . Not on file   Social History Narrative  . No narrative on file     Review of Systems    General:  No chills, fever, night sweats or weight changes.  Cardiovascular:  +++ recent chest pain and DOE as outlined above.  +++ chronic left sided UE/LE edema, no orthopnea, palpitations, paroxysmal nocturnal dyspnea. Dermatological: No rash, lesions/masses Respiratory: No cough, +++ dyspnea Urologic: No hematuria, dysuria Abdominal:   No nausea, vomiting, diarrhea, bright red blood per rectum, melena, or hematemesis Neurologic:  No visual changes, wkns, changes in mental status. All other systems reviewed and are otherwise negative except as noted above.  Physical Exam    Blood pressure (!) 139/59, pulse 68, temperature 98.8 F (37.1 C), temperature source Oral, resp. rate 18, height 5\' 4"  (1.626 m), weight 197 lb 3.2 oz (89.4 kg), SpO2 98 %.  General: Pleasant, NAD Psych: Normal affect. Neuro: Alert and oriented X 3. Moves all extremities spontaneously. HEENT: Normal  Neck: Supple without bruits.  JVP ~ 14 cm. Lungs:  Resp regular and unlabored, CTA. Heart: RRR no s3, s4, or murmurs.  R wrist cath site w/o bleeding/bruit/hematoma. Abdomen: Soft, non-tender, non-distended, BS + x 4.  Extremities: No clubbing, cyanosis or edema. DP/PT/Radials 2+ and equal bilaterally.  Labs     Recent Labs  08/12/16 1442 08/12/16 1955 08/13/16 0202  TROPONINI <0.03 <0.03 <0.03   Lab Results  Component Value Date   WBC 10.1 08/15/2016   HGB 10.3 (L) 08/15/2016   HCT 30.7 (L) 08/15/2016   MCV 80.0 08/15/2016   PLT 187 08/15/2016    Recent Labs Lab 08/15/16 0600  NA 137  K 4.5  CL 104  CO2 25  BUN 26*  CREATININE 1.56*  CALCIUM 9.0    GLUCOSE 265*    Radiology Studies    Dg Chest 2 View  Result Date: 08/12/2016 CLINICAL DATA:  Chest pain and shortness of Breath EXAM: CHEST  2 VIEW COMPARISON:  06/20/2016 FINDINGS: Cardiac shadow is stable. Mild vascular congestion is seen. No focal infiltrate or sizable effusion is noted. No bony abnormality is seen. IMPRESSION: Mild vascular congestion. Electronically Signed   By: Inez Catalina M.D.   On: 08/12/2016 08:58    ECG & Cardiac Imaging    RSR, 65, 1st deg AVB, LAD, LAFB, RBBB  Assessment & Plan    1.  USA/CAD: s/p prior LAD and RCA stenting @ [facility].  Admitted with a 2+ mo h/o progressive DOE and exertional angina.  Trops nl.  Cath yesterday revealed 70% ostial LM and prox/mid RCA dzs.  Surgical eval felt to be best option.  Pt has previously been followed @ [facility] but prefers to be tx to [facility].  I have also discussed this with his family this am and they are all in agreement with that plan.  Cont asa, statin, heparin,  blocker.   2.  Chronic combined syst/diast CHF/NICM:  EF 45-50% by echo this admission.  JVP up this am in setting of post-cath hydration.  Resume prior home dose of lasix.  Cont  blocker.  [name] also on enalapril, eplerenone, and digoxin @ home.  These have been on hold due to elevated creat and need for contrast.  I will cont to hold for now.  Creat stable.  3.  Essential HTN:  Stable.  4.  HL:  On statin.  Check lipids.  5.  CKD III: stable post-cath. Cont to hold acei/eplerenone and watch for CIN.  6. PAF:  No AF this admission.  Coumadin remains on hold - bridging with heparin in setting of #1.  7.  DM II:  Cont insulin/SSI.  Signed, [name], NP 08/15/2016, 11:51 AM

## 2016-08-15 NOTE — Care Management (Signed)
Patient to transfer to [facility] for CABG consideration

## 2016-08-15 NOTE — Progress Notes (Signed)
Heparin drip started at 11. Bed alarm on. No complaints. No s/s distress. Right radial site unremarkable, with good pulse.

## 2016-08-15 NOTE — Progress Notes (Addendum)
Patient to be transport to [facility], called  Report to RN. carelink on the floor to transport patient, packet given to carelink. Iv intact x2. Heparin infusing at 11. Patient to be transported on RA, no c/o pain. Telemetry removed and returned

## 2016-08-15 NOTE — Progress Notes (Signed)
08/15/2016 1530 Received pt to room 2W29 a transfer from [facility] for Cardiac surgery evaluation.  Pt is A&O and W/O pain.  Currently on a heparin gtt @11cc /hr.  Tele monitor applied and CCMD notified.  Oriented pt to room, call light and bed.  Call bell in reach.  Engineer, maintenance (IT) notified of pt's arrival. [place]

## 2016-08-16 ENCOUNTER — Inpatient Hospital Stay (HOSPITAL_COMMUNITY): Payer: Medicare HMO

## 2016-08-16 ENCOUNTER — Other Ambulatory Visit: Payer: Self-pay

## 2016-08-16 DIAGNOSIS — I25110 Atherosclerotic heart disease of native coronary artery with unstable angina pectoris: Principal | ICD-10-CM

## 2016-08-16 LAB — BASIC METABOLIC PANEL
ANION GAP: 9 (ref 5–15)
ANION GAP: 10 (ref 5–15)
BUN: 20 mg/dL (ref 6–20)
BUN: 21 mg/dL — ABNORMAL HIGH (ref 6–20)
CALCIUM: 9.5 mg/dL (ref 8.9–10.3)
CO2: 24 mmol/L (ref 22–32)
CO2: 24 mmol/L (ref 22–32)
CREATININE: 1.48 mg/dL — AB (ref 0.61–1.24)
Calcium: 9.4 mg/dL (ref 8.9–10.3)
Chloride: 104 mmol/L (ref 101–111)
Chloride: 100 mmol/L — ABNORMAL LOW (ref 101–111)
Creatinine, Ser: 1.38 mg/dL — ABNORMAL HIGH (ref 0.61–1.24)
GFR calc non Af Amer: 43 mL/min — ABNORMAL LOW (ref >60)
GFR, EST AFRICAN AMERICAN: 54 mL/min — AB (ref >60)
GFR, EST AFRICAN AMERICAN: 50 mL/min — AB (ref >60)
GFR, EST NON AFRICAN AMERICAN: 47 mL/min — AB (ref >60)
Glucose, Bld: 161 mg/dL — ABNORMAL HIGH (ref 65–99)
Glucose, Bld: 146 mg/dL — ABNORMAL HIGH (ref 65–99)
POTASSIUM: 4 mmol/L (ref 3.5–5.1)
Potassium: 4.4 mmol/L (ref 3.5–5.1)
SODIUM: 137 mmol/L (ref 135–145)
SODIUM: 134 mmol/L — AB (ref 135–145)

## 2016-08-16 LAB — CBC
HCT: 31.8 % — ABNORMAL LOW (ref 39.0–52.0)
HEMATOCRIT: 32.8 % — AB (ref 39.0–52.0)
HEMOGLOBIN: 9.8 g/dL — AB (ref 13.0–17.0)
HEMOGLOBIN: 10.4 g/dL — AB (ref 13.0–17.0)
MCH: 25.4 pg — ABNORMAL LOW (ref 26.0–34.0)
MCH: 26.4 pg (ref 26.0–34.0)
MCHC: 30.8 g/dL (ref 30.0–36.0)
MCHC: 31.7 g/dL (ref 30.0–36.0)
MCV: 82.4 fL (ref 78.0–100.0)
MCV: 83.2 fL (ref 78.0–100.0)
PLATELETS: 182 10*3/uL (ref 150–400)
Platelets: 203 10*3/uL (ref 150–400)
RBC: 3.86 MIL/uL — AB (ref 4.22–5.81)
RBC: 3.94 MIL/uL — AB (ref 4.22–5.81)
RDW: 16.5 % — ABNORMAL HIGH (ref 11.5–15.5)
RDW: 16.5 % — ABNORMAL HIGH (ref 11.5–15.5)
WBC: 9.1 10*3/uL (ref 4.0–10.5)
WBC: 8.9 10*3/uL (ref 4.0–10.5)

## 2016-08-16 LAB — HEPARIN LEVEL (UNFRACTIONATED)
Heparin Unfractionated: 0.8 IU/mL — ABNORMAL HIGH (ref 0.30–0.70)
Heparin Unfractionated: 0.82 IU/mL — ABNORMAL HIGH (ref 0.30–0.70)

## 2016-08-16 LAB — PROTIME-INR
INR: 1.41
Prothrombin Time: 17.4 seconds — ABNORMAL HIGH (ref 11.4–15.2)

## 2016-08-16 LAB — GLUCOSE, CAPILLARY
GLUCOSE-CAPILLARY: 148 mg/dL — AB (ref 65–99)
GLUCOSE-CAPILLARY: 144 mg/dL — AB (ref 65–99)
Glucose-Capillary: 153 mg/dL — ABNORMAL HIGH (ref 65–99)
Glucose-Capillary: 194 mg/dL — ABNORMAL HIGH (ref 65–99)

## 2016-08-16 LAB — LIPID PANEL
CHOL/HDL RATIO: 4.5 ratio
CHOLESTEROL: 107 mg/dL (ref 0–200)
HDL: 24 mg/dL — AB (ref >40)
LDL Cholesterol: 15 mg/dL (ref 0–99)
TRIGLYCERIDES: 338 mg/dL — AB (ref <150)
VLDL: 68 mg/dL — AB (ref 0–40)

## 2016-08-16 LAB — TROPONIN I: TROPONIN I: 0.03 ng/mL — AB (ref <0.03)

## 2016-08-16 MED ORDER — FUROSEMIDE 10 MG/ML IJ SOLN
20.0000 mg | Freq: Once | INTRAMUSCULAR | Status: AC
  Administered 2016-08-16: 20 mg via INTRAVENOUS
  Filled 2016-08-16: qty 2

## 2016-08-16 MED ORDER — TICAGRELOR 90 MG PO TABS
90.0000 mg | ORAL_TABLET | Freq: Two times a day (BID) | ORAL | Status: DC
  Administered 2016-08-17 – 2016-08-20 (×8): 90 mg via ORAL
  Filled 2016-08-16 (×8): qty 1

## 2016-08-16 MED ORDER — TICAGRELOR 90 MG PO TABS
180.0000 mg | ORAL_TABLET | Freq: Once | ORAL | Status: AC
  Administered 2016-08-16: 180 mg via ORAL
  Filled 2016-08-16: qty 2

## 2016-08-16 NOTE — Progress Notes (Signed)
Patient stated that he is supposed to get stents placed today. No orders placed as of yet. Gave all morning meds except his insulin. Will have morning RN check with physician, has been NPO since midnight.

## 2016-08-16 NOTE — Progress Notes (Addendum)
CRITICAL VALUE ALERT  Critical Value:  Troponin 0.03  Date & Time Notied:  08/16/2016 2050  Provider Notified: yes Carn[place]i  Orders Received/Actions taken: continue to monitor. Pt is asymptomatic with no complaints of cp. Heparin currently running. Nausea and vomiting has improved with zofran.

## 2016-08-16 NOTE — Progress Notes (Signed)
ANTICOAGULATION CONSULT NOTE - Follow Up Consult  Pharmacy Consult for Heparin Indication: chest pain/ACS and atrial fibrillation  Allergies  Allergen Reactions  . Clopidogrel Anaphylaxis    Other reaction(s): ANAPHYLAXIS;altered mental status;  (electrolytes "out of wack" and confusion per family; THEY DO NOT REMEMBER ANY OTHER REACTION)- MSB 10/10/15  . Ciprofloxacin Itching  . Lyrica [Pregabalin] Itching  . Spironolactone     Other reaction(s): Other (See Comments) gynecomastia  . Plavix [Clopidogrel Bisulfate] Anxiety    Patient Measurements: Height: 5\' 6"  (167.6 cm) Weight: 199 lb (90.3 kg) IBW/kg (Calculated) : 63.8 Heparin Dosing Weight: 79 kg  Vital Signs: Temp: 98.2 F (36.8 C) (06/01 0531) Temp Source: Other (Comment) (06/01 0531) BP: 139/52 (06/01 0531) Pulse Rate: 64 (06/01 0531)  Labs:  Recent Labs  08/14/16 0520 08/14/16 0521  08/14/16 1931 08/15/16 0600  08/15/16 1930 08/16/16 0329 08/16/16 1259  HGB  --   --   < > 11.4* 10.3*  --   --  9.8*  --   HCT  --   --   --  34.6* 30.7*  --   --  31.8*  --   PLT  --   --   --  190 187  --   --  182  --   APTT  --   --   --  37*  --   --   --   --   --   LABPROT 18.5*  --   --   --  17.9*  --   --  17.4*  --   INR 1.52  --   --   --  1.46  --   --  1.41  --   HEPARINUNFRC  --   --   --   --   --   < > 0.67 0.80* 0.82*  CREATININE  --  1.56*  --   --  1.56*  --   --  1.38*  --   < > = values in this interval not displayed.  Estimated Creatinine Clearance: 44.9 mL/min (A) (by C-G formula based on SCr of 1.38 mg/dL (H)).  Assessment: 83 yr old male transferred from [facility] to [facility] for consideration for CABG.  S/p cardiac cath on 08/14/16. Dr. Prescott Gum recommends PCI rather than CABG due to comorbid medical problems.  On Coumadin prior to admission for afib.  Continue to hold Coumadin pending plans for PCI Monday 6/4.   Heparin level still supratherapeutic (0.82) despite rate decrease.  Hgb down to 9.8, no  bleeding noted.  Goal of Therapy:  Heparin level 0.3-0.7 units/ml Monitor platelets by anticoagulation protocol: Yes   Plan:  Decrease heparin drip to 800 units/hr. Will f/u 8 hr heparin level Daily heparin level and CBC  Detron Carras, Pharm.D., BCPS Clinical Pharmacist Pager: [phone] Clinical phone for 08/16/2016 from 8:30-4:00 is [phone]. After 4pm, please call Main Rx [phone]) for assistance. 08/16/2016 2:36 PM

## 2016-08-16 NOTE — Progress Notes (Signed)
ANTICOAGULATION CONSULT NOTE - Follow Up Consult  Pharmacy Consult for Heparin Indication: chest pain/ACS and atrial fibrillation  Allergies  Allergen Reactions  . Clopidogrel Anaphylaxis    Other reaction(s): ANAPHYLAXIS;altered mental status;  (electrolytes "out of wack" and confusion per family; THEY DO NOT REMEMBER ANY OTHER REACTION)- MSB 10/10/15  . Ciprofloxacin Itching  . Lyrica [Pregabalin] Itching  . Spironolactone     Other reaction(s): Other (See Comments) gynecomastia  . Plavix [Clopidogrel Bisulfate] Anxiety    Patient Measurements: Height: 5\' 6"  (167.6 cm) Weight: 202 lb 12.8 oz (92 kg) IBW/kg (Calculated) : 63.8 Heparin Dosing Weight: 79 kg  Vital Signs: Temp: 98.3 F (36.8 C) (05/31 2002) Temp Source: Oral (05/31 2002) BP: 142/66 (05/31 2002) Pulse Rate: 57 (05/31 2002)  Labs:  Recent Labs  08/14/16 0520 08/14/16 0521  08/14/16 1931 08/15/16 0600 08/15/16 1204 08/15/16 1930 08/16/16 0329  HGB  --   --   < > 11.4* 10.3*  --   --  9.8*  HCT  --   --   --  34.6* 30.7*  --   --  31.8*  PLT  --   --   --  190 187  --   --  182  APTT  --   --   --  37*  --   --   --   --   LABPROT 18.5*  --   --   --  17.9*  --   --  17.4*  INR 1.52  --   --   --  1.46  --   --  1.41  HEPARINUNFRC  --   --   --   --   --  0.35 0.67 0.80*  CREATININE  --  1.56*  --   --  1.56*  --   --   --   < > = values in this interval not displayed.  Estimated Creatinine Clearance: 40.1 mL/min (A) (by C-G formula based on SCr of 1.56 mg/dL (H)).  Assessment: 83 yr old male transferred from [facility] to [facility] for consideration for CABG.  S/p cardia cath on 08/14/16. Dr. Prescott Gum recommends PCI rather than CABG due to comorbid medical problems.  On Coumadin prior to admission for afib.  INR down to 1.41 today, last Coumadin dose was 4 mg (usual dose) on 08/12/16.  Heparin level up to supratherapeutic (0.80) on 1100 units/hr. Hgb down to 9.8, no bleeding noted.  Goal of Therapy:  Heparin  level 0.3-0.7 units/ml Monitor platelets by anticoagulation protocol: Yes   Plan:  Decrease heparin drip to 950 units/hr. Will f/u 8 hr heparin level  Sherlon Handing, PharmD, BCPS Clinical pharmacist, pager [phone] 08/16/2016,4:31 AM

## 2016-08-16 NOTE — Progress Notes (Signed)
Cardiology Event Note  Date: 08/16/16 Time: 7:41 PM  Patient seen this evening to discuss upcoming PCI on Monday (6/4). He complained of shortness of breath, nausea, and chills x 1 hour that began after eating. He had also received ticagrelor earlier in the afternoon. [name] beginning to feel better but still has shortness of breath. No obvious JVD but trace to 1+ pretibial edema is noted. Heart sounds are distant but regular. Lungs demonstrate rhonchi and mildly diminished breath sounds at the bases. EKG is unchanged with RBBB and no acute ischemic changes. CXR shows mild prominence of interstitial markings but no frank edema.  I suspect that Lucas Caldwell' dyspnea may be related to ticagrelor. He has evidence of minimal fluid retention, which could be contributing as well. CBC, BMP, and Tn are pending. Will administer furosemide 20 mg IV x 1 and monitor. Hopefully, he will tolerate ticagrelor going forward with planned PCI to RCA on Monday and history of clopidogrel allergy/intolerance. Of note, Lucas Caldwell does not know what he was treated with after prior stent placement.  [name], MD [facility] Pager: [phone]

## 2016-08-16 NOTE — Progress Notes (Addendum)
Progress Note  Patient Name: Lucas Caldwell Date of Encounter: 08/16/2016  Primary Cardiologist: Andree Elk [facility])  Subjective   No chest pain this morning.   Inpatient Medications    Scheduled Meds: . allopurinol  300 mg Oral Daily  . aspirin EC  81 mg Oral BH-q7a  . atorvastatin  80 mg Oral q1800  . bosutinib  200 mg Oral Q supper  . famotidine  20 mg Oral QHS  . furosemide  20 mg Oral Daily  . insulin aspart  0-15 Units Subcutaneous TID WC  . insulin glargine  50 Units Subcutaneous QHS  . levothyroxine  25 mcg Oral QAC breakfast  . magnesium oxide  400 mg Oral BID  . metoprolol succinate  50 mg Oral BH-q7a  . mirtazapine  15 mg Oral QHS  . pantoprazole  40 mg Oral Daily   Continuous Infusions: . heparin 950 Units/hr (08/16/16 0448)   PRN Meds: albuterol, loperamide, nitroGLYCERIN, ondansetron (ZOFRAN) IV   Vital Signs    Vitals:   08/15/16 1330 08/15/16 2002 08/16/16 0531  BP: (!) 139/56 (!) 142/66 (!) 139/52  Pulse: (!) 57 (!) 57 64  Resp: 18 18 18   Temp: 98.2 F (36.8 C) 98.3 F (36.8 C) 98.2 F (36.8 C)  TempSrc: Oral Oral Other (Comment)  SpO2: 100% 100% 99%  Weight: 202 lb 12.8 oz (92 kg)  199 lb (90.3 kg)  Height: 5\' 6"  (1.676 m)      Intake/Output Summary (Last 24 hours) at 08/16/16 4097 Last data filed at 08/16/16 0408  Gross per 24 hour  Intake                0 ml  Output              700 ml  Net             -700 ml   Filed Weights   08/15/16 1330 08/16/16 0531  Weight: 202 lb 12.8 oz (92 kg) 199 lb (90.3 kg)    Telemetry    SR - Personally Reviewed  ECG    N/A - Personally Reviewed  Physical Exam   General: Well developed, well nourished, male appearing in no acute distress. Head: Normocephalic, atraumatic.  Neck: Supple without bruits, JVD. Lungs:  Resp regular and unlabored, CTA. Heart: RRR, S1, S2, no S3, S4, or murmur; no rub. Abdomen: Soft, non-tender, non-distended with normoactive bowel sounds. No hepatomegaly. No  rebound/guarding. No obvious abdominal masses. Extremities: No clubbing, cyanosis, trace LE edema. Distal pedal pulses are 2+ bilaterally. Right radial cath site stable.  Neuro: Alert and oriented X 3. Moves all extremities spontaneously. Psych: Normal affect.  Labs    Chemistry Recent Labs Lab 08/14/16 0521 08/15/16 0600 08/16/16 0329  NA 136 137 137  K 4.1 4.5 4.4  CL 103 104 104  CO2 24 25 24   GLUCOSE 223* 265* 161*  BUN 31* 26* 20  CREATININE 1.56* 1.56* 1.38*  CALCIUM 9.3 9.0 9.5  GFRNONAA 40* 40* 47*  GFRAA 47* 47* 54*  ANIONGAP 9 8 9      Hematology Recent Labs Lab 08/14/16 1931 08/15/16 0600 08/16/16 0329  WBC 8.8 10.1 9.1  RBC 4.30* 3.84* 3.86*  HGB 11.4* 10.3* 9.8*  HCT 34.6* 30.7* 31.8*  MCV 80.5 80.0 82.4  MCH 26.4 27.0 25.4*  MCHC 32.8 33.7 30.8  RDW 18.0* 17.2* 16.5*  PLT 190 187 182    Cardiac Enzymes Recent Labs Lab 08/12/16 0851 08/12/16 1442 08/12/16 1955  08/13/16 0202  TROPONINI <0.03 <0.03 <0.03 <0.03   No results for input(s): TROPIPOC in the last 168 hours.   BNP Recent Labs Lab 08/12/16 0851  BNP 95.0     DDimer No results for input(s): DDIMER in the last 168 hours.    Radiology    No results found.  Cardiac Studies   Echo: 08/12/16  Study Conclusions  - Left ventricle: The cavity size was mildly dilated. There was   mild concentric hypertrophy. Systolic function was mildly   reduced. The estimated ejection fraction was in the range of 45%   to 50%. Possible hypokinesis of the inferior myocardium. Left   ventricular diastolic function parameters were normal. - Mitral valve: There was mild regurgitation. - Left atrium: The atrium was mildly dilated. - Pulmonary arteries: Systolic pressure could not be accurately   estimated.  LHC: 08/14/16  Conclusion   Conclusions: 1. Significant multivessel coronary artery disease, including 70% ostial LMCA stenosis (with catheter dampening) and 95% proximal/mid RCA  lesion. 2. Patent stents in the mid LCx and mid RCA. 3. Upper normal left ventricular filling pressure.  Recommendations: 1. Given LMCA and RCA disease in the setting of diabetes mellitus, surgical consultation for CABG should be considered. However, if comorbidities preclude surgical intervention, PCI to unprotected LMCA and mid RCA could be considered. 2. Medical therapy, including gentle hydration, given chronic kidney disease. 3. Aggressive secondary prevention.  [name], MD   Diagnostic Diagram        Patient Profile     83 y.o. male with a history of NICM, chronic combined syst/diast CHF, CKD, HTN, HL, DM, CML, IDA, and hypothyroidism, admitted 5/28 with chest pain and dyspnea, and now found to have severe LM and RCA dzs. He was transferred to [facility] for CVTS evaluation.   Assessment & Plan    1. USA/CAD: s/p prior LAD and RCA stenting @ [facility]. Admitted with a 2+ month h/o progressive DOE and exertional angina. Trops nl. Cath yesterday revealed 70% ostial LM and prox/mid RCA dzs. He was transferred to [facility] for surgical consult, but felt to not be a good candidate. Will plan for PCI on Monday, with possible Impella support. No further reports of chest pain.  -- Cont asa, statin, heparin, ? blocker.   2. Chronic combined syst/diast CHF/NICM: EF 45-50% by echo this admission. Appears volume stable on exam.  -- Continue current dose of lasix, and ? blocker.  --[name] also on enalapril, eplerenone, and digoxin @ home. Will continue to hold at this time given elevated, but improving, Cr.   3. Essential HTN: Stable.  4. HL: On statin. Trig 338, consider the addition of fenofibrate.   5. CKD III: stable post-cath. Cont to hold acei/eplerenone.  6. PAF: No AF this admission.  -- Coumadin remains on hold - bridging with heparin  7. DM II: Cont insulin/SSI.  Signed, Reino Bellis, NP  08/16/2016, 9:38 AM    I have personally seen and examined this  patient with Reino Bellis, NP. I agree with the assessment and plan as outlined above. He has had progressive DOE and chest pain. This is felt to be unstable angina. Cath at [facility] per Dr. Saunders Revel 08/14/16 with moderate ostial left main stenosis and severe proximal RCA stenosis. He was been turned down for CABG. Plans for PCI of RCA on Monday of next week by Dr. Saunders Revel. May consider PCI of RCA and then medical management of moderate left main disease. Will not need Impella support for  PCI of RCA.  Will load with Brilinta (Plavix allergy).  Continue ASA, statin and beta blocker.  Cath orders placed for PCI Monday with Dr. Saunders Revel.   Lauree Chandler 08/16/2016 11:58 AM

## 2016-08-17 DIAGNOSIS — I10 Essential (primary) hypertension: Secondary | ICD-10-CM

## 2016-08-17 DIAGNOSIS — I200 Unstable angina: Secondary | ICD-10-CM

## 2016-08-17 DIAGNOSIS — E7800 Pure hypercholesterolemia, unspecified: Secondary | ICD-10-CM

## 2016-08-17 DIAGNOSIS — N183 Chronic kidney disease, stage 3 (moderate): Secondary | ICD-10-CM

## 2016-08-17 LAB — CBC
HEMATOCRIT: 34.2 % — AB (ref 39.0–52.0)
Hemoglobin: 10.8 g/dL — ABNORMAL LOW (ref 13.0–17.0)
MCH: 26 pg (ref 26.0–34.0)
MCHC: 31.6 g/dL (ref 30.0–36.0)
MCV: 82.2 fL (ref 78.0–100.0)
PLATELETS: 206 10*3/uL (ref 150–400)
RBC: 4.16 MIL/uL — ABNORMAL LOW (ref 4.22–5.81)
RDW: 16.6 % — ABNORMAL HIGH (ref 11.5–15.5)
WBC: 10.8 10*3/uL — AB (ref 4.0–10.5)

## 2016-08-17 LAB — GLUCOSE, CAPILLARY
GLUCOSE-CAPILLARY: 125 mg/dL — AB (ref 65–99)
GLUCOSE-CAPILLARY: 143 mg/dL — AB (ref 65–99)
GLUCOSE-CAPILLARY: 201 mg/dL — AB (ref 65–99)
Glucose-Capillary: 201 mg/dL — ABNORMAL HIGH (ref 65–99)

## 2016-08-17 LAB — HEPARIN LEVEL (UNFRACTIONATED)
HEPARIN UNFRACTIONATED: 0.65 [IU]/mL (ref 0.30–0.70)
Heparin Unfractionated: 0.64 IU/mL (ref 0.30–0.70)

## 2016-08-17 LAB — TROPONIN I: Troponin I: 0.03 ng/mL (ref <0.03)

## 2016-08-17 MED ORDER — FENOFIBRATE 160 MG PO TABS
160.0000 mg | ORAL_TABLET | Freq: Every day | ORAL | Status: DC
  Administered 2016-08-17 – 2016-08-20 (×4): 160 mg via ORAL
  Filled 2016-08-17 (×4): qty 1

## 2016-08-17 NOTE — Progress Notes (Signed)
ANTICOAGULATION CONSULT NOTE - Follow Up Consult  Pharmacy Consult for Heparin Indication: chest pain/ACS and atrial fibrillation  Allergies  Allergen Reactions  . Clopidogrel Anaphylaxis    Other reaction(s): ANAPHYLAXIS;altered mental status;  (electrolytes "out of wack" and confusion per family; THEY DO NOT REMEMBER ANY OTHER REACTION)- MSB 10/10/15  . Ciprofloxacin Itching  . Lyrica [Pregabalin] Itching  . Spironolactone     Other reaction(s): Other (See Comments) gynecomastia  . Plavix [Clopidogrel Bisulfate] Anxiety    Patient Measurements: Height: 5\' 6"  (167.6 cm) Weight: 199 lb (90.3 kg) IBW/kg (Calculated) : 63.8 Heparin Dosing Weight: 79 kg  Vital Signs: Temp: 98.5 F (36.9 C) (06/01 2008) Temp Source: Oral (06/01 2008) BP: 142/64 (06/01 2008) Pulse Rate: 64 (06/01 2008)  Labs:  Recent Labs  08/14/16 0520  08/14/16 1931 08/15/16 0600  08/16/16 0329 08/16/16 1259 08/16/16 1920 08/17/16 0013  HGB  --   < > 11.4* 10.3*  --  9.8*  --  10.4*  --   HCT  --   < > 34.6* 30.7*  --  31.8*  --  32.8*  --   PLT  --   < > 190 187  --  182  --  203  --   APTT  --   --  37*  --   --   --   --   --   --   LABPROT 18.5*  --   --  17.9*  --  17.4*  --   --   --   INR 1.52  --   --  1.46  --  1.41  --   --   --   HEPARINUNFRC  --   --   --   --   < > 0.80* 0.82*  --  0.65  CREATININE  --   < >  --  1.56*  --  1.38*  --  1.48*  --   TROPONINI  --   --   --   --   --   --   --  0.03*  --   < > = values in this interval not displayed.  Estimated Creatinine Clearance: 41.9 mL/min (A) (by C-G formula based on SCr of 1.48 mg/dL (H)).  Assessment: 83 yr old male transferred from [facility] to [facility] for consideration for CABG.  S/p cardiac cath on 08/14/16. Dr. Prescott Gum recommends PCI rather than CABG due to comorbid medical problems.  On Coumadin prior to admission for afib.  Continue to hold Coumadin pending plans for PCI Monday 6/4.   Heparin level now down to therapeutic (0.65)  on gtt at 800 units/hr. No bleeding noted. CBC stable.  Goal of Therapy:  Heparin level 0.3-0.7 units/ml Monitor platelets by anticoagulation protocol: Yes   Plan:  Continue heparin drip to 800 units/hr. Daily heparin level and CBC  Sherlon Handing, PharmD, BCPS Clinical pharmacist, pager [phone] 08/17/2016 1:56 AM

## 2016-08-17 NOTE — Progress Notes (Signed)
ANTICOAGULATION CONSULT NOTE - Follow Up Consult  Pharmacy Consult for Heparin Indication: chest pain/ACS and atrial fibrillation  Allergies  Allergen Reactions  . Clopidogrel Anaphylaxis    Other reaction(s): ANAPHYLAXIS;altered mental status;  (electrolytes "out of wack" and confusion per family; THEY DO NOT REMEMBER ANY OTHER REACTION)- MSB 10/10/15  . Ciprofloxacin Itching  . Lyrica [Pregabalin] Itching  . Spironolactone     Other reaction(s): Other (See Comments) gynecomastia  . Plavix [Clopidogrel Bisulfate] Anxiety    Patient Measurements: Height: 5\' 6"  (167.6 cm) Weight: 195 lb 4.8 oz (88.6 kg) IBW/kg (Calculated) : 63.8 Heparin Dosing Weight: 79 kg  Vital Signs: Temp: 98.3 F (36.8 C) (06/02 0442) Temp Source: Oral (06/02 0442) BP: 141/60 (06/02 0442) Pulse Rate: 68 (06/02 0442)  Labs:  Recent Labs  08/14/16 1931 08/15/16 0600  08/16/16 0329 08/16/16 1259 08/16/16 1920 08/17/16 0013 08/17/16 0448  HGB 11.4* 10.3*  --  9.8*  --  10.4*  --  10.8*  HCT 34.6* 30.7*  --  31.8*  --  32.8*  --  34.2*  PLT 190 187  --  182  --  203  --  206  APTT 37*  --   --   --   --   --   --   --   LABPROT  --  17.9*  --  17.4*  --   --   --   --   INR  --  1.46  --  1.41  --   --   --   --   HEPARINUNFRC  --   --   < > 0.80* 0.82*  --  0.65 0.64  CREATININE  --  1.56*  --  1.38*  --  1.48*  --   --   TROPONINI  --   --   --   --   --  0.03*  --  0.03*  < > = values in this interval not displayed.  Estimated Creatinine Clearance: 41.5 mL/min (A) (by C-G formula based on SCr of 1.48 mg/dL (H)).  Assessment: 83 yr old male transferred from [facility] to [facility] for consideration for CABG.  S/p cardiac cath on 08/14/16. Dr. Prescott Gum recommends PCI rather than CABG due to comorbid medical problems. On Coumadin prior to admission for afib.  Continue to hold Coumadin pending plans for PCI Monday 6/4.  -Heparin level is noted at goal  Goal of Therapy:  Heparin level 0.3-0.7  units/ml Monitor platelets by anticoagulation protocol: Yes   Plan:  -No heparin changes needed -Daily heparin level and CBC  Hildred Laser, Pharm D 08/17/2016 11:14 AM

## 2016-08-17 NOTE — Progress Notes (Signed)
CARDIAC REHAB PHASE I   PRE:  Rate/Rhythm: 64 SR  BP:  Supine:   Sitting: 124/72  Standing:    SaO2: 99%RA  MODE:  Ambulation: 150 ft   POST:  Rate/Rhythm: 73 SR PVCs  BP:  Supine:   Sitting: 143/61  Standing:    SaO2: 100%RA 1107-1135 Pt walked 150 ft on RA pushing IV pole. No c/o CP but stated felt like he walked 5 miles. Back to recliner. Tolerated well. Sats good. Still feeling SOB at times.   Graylon Good, RN BSN  08/17/2016 11:30 AM

## 2016-08-17 NOTE — Progress Notes (Addendum)
Progress Note  Patient Name: Lucas Caldwell Date of Encounter: 08/17/2016  Primary Cardiologist: Andree Elk [facility])  Subjective   Denies chest pain.  Has intermittent SOB when walking in the room.  Inpatient Medications    Scheduled Meds: . allopurinol  300 mg Oral Daily  . aspirin EC  81 mg Oral BH-q7a  . atorvastatin  80 mg Oral q1800  . bosutinib  200 mg Oral Q supper  . famotidine  20 mg Oral QHS  . furosemide  20 mg Oral Daily  . insulin aspart  0-15 Units Subcutaneous TID WC  . insulin glargine  50 Units Subcutaneous QHS  . levothyroxine  25 mcg Oral QAC breakfast  . magnesium oxide  400 mg Oral BID  . metoprolol succinate  50 mg Oral BH-q7a  . mirtazapine  15 mg Oral QHS  . pantoprazole  40 mg Oral Daily  . ticagrelor  90 mg Oral BID   Continuous Infusions: . heparin 800 Units/hr (08/17/16 1104)   PRN Meds: albuterol, loperamide, nitroGLYCERIN, ondansetron (ZOFRAN) IV   Vital Signs    Vitals:   08/16/16 1459 08/16/16 1900 08/16/16 2008 08/17/16 0442  BP: (!) 128/46  (!) 142/64 (!) 141/60  Pulse: (!) 59  64 68  Resp: 19  19 19   Temp: 98.3 F (36.8 C) 98.7 F (37.1 C) 98.5 F (36.9 C) 98.3 F (36.8 C)  TempSrc: Oral Oral Oral Oral  SpO2: 99%  100% 100%  Weight:    195 lb 4.8 oz (88.6 kg)  Height:        Intake/Output Summary (Last 24 hours) at 08/17/16 1116 Last data filed at 08/17/16 0442  Gross per 24 hour  Intake              600 ml  Output             1450 ml  Net             -850 ml   Filed Weights   08/15/16 1330 08/16/16 0531 08/17/16 0442  Weight: 202 lb 12.8 oz (92 kg) 199 lb (90.3 kg) 195 lb 4.8 oz (88.6 kg)    Telemetry    NSR - Personally Reviewed  ECG    No new EKG to review - Personally Reviewed  Physical Exam   General: WD, WN in NAD Head: normal  Neck: no JVD or bruit Lungs:  CTA bilaterally Heart: RRR no M/R/G Abdomen: soft NT, ND with active BS Extremities:no edema.  Right radial cath site stable Neuro: A&O x  3 Psych: normal mood  Labs    Chemistry  Recent Labs Lab 08/15/16 0600 08/16/16 0329 08/16/16 1920  NA 137 137 134*  K 4.5 4.4 4.0  CL 104 104 100*  CO2 25 24 24   GLUCOSE 265* 161* 146*  BUN 26* 20 21*  CREATININE 1.56* 1.38* 1.48*  CALCIUM 9.0 9.5 9.4  GFRNONAA 40* 47* 43*  GFRAA 47* 54* 50*  ANIONGAP 8 9 10      Hematology  Recent Labs Lab 08/16/16 0329 08/16/16 1920 08/17/16 0448  WBC 9.1 8.9 10.8*  RBC 3.86* 3.94* 4.16*  HGB 9.8* 10.4* 10.8*  HCT 31.8* 32.8* 34.2*  MCV 82.4 83.2 82.2  MCH 25.4* 26.4 26.0  MCHC 30.8 31.7 31.6  RDW 16.5* 16.5* 16.6*  PLT 182 203 206    Cardiac Enzymes  Recent Labs Lab 08/12/16 1955 08/13/16 0202 08/16/16 1920 08/17/16 0448  TROPONINI <0.03 <0.03 0.03* 0.03*   No results  for input(s): TROPIPOC in the last 168 hours.   BNP  Recent Labs Lab 08/12/16 0851  BNP 95.0     DDimer No results for input(s): DDIMER in the last 168 hours.    Radiology    Dg Chest Port 1 View  Result Date: 08/16/2016 CLINICAL DATA:  Shortness of breath EXAM: PORTABLE CHEST 1 VIEW COMPARISON:  08/12/2016 FINDINGS: Chronic cardiomegaly and hyperinflation. Chronic mild interstitial coarsening. There is no edema, consolidation, effusion, or pneumothorax. Stable aortic and hilar contours. No acute osseous finding. IMPRESSION: 1. No acute finding. 2. Chronic cardiomegaly and hyperinflation. Electronically Signed   By: Monte Fantasia M.D.   On: 08/16/2016 19:16    Cardiac Studies   Echo: 08/12/16  Study Conclusions  - Left ventricle: The cavity size was mildly dilated. There was   mild concentric hypertrophy. Systolic function was mildly   reduced. The estimated ejection fraction was in the range of 45%   to 50%. Possible hypokinesis of the inferior myocardium. Left   ventricular diastolic function parameters were normal. - Mitral valve: There was mild regurgitation. - Left atrium: The atrium was mildly dilated. - Pulmonary arteries:  Systolic pressure could not be accurately   estimated.  LHC: 08/14/16  Conclusion   Conclusions: 1. Significant multivessel coronary artery disease, including 70% ostial LMCA stenosis (with catheter dampening) and 95% proximal/mid RCA lesion. 2. Patent stents in the mid LCx and mid RCA. 3. Upper normal left ventricular filling pressure.  Recommendations: 1. Given LMCA and RCA disease in the setting of diabetes mellitus, surgical consultation for CABG should be considered. However, if comorbidities preclude surgical intervention, PCI to unprotected LMCA and mid RCA could be considered. 2. Medical therapy, including gentle hydration, given chronic kidney disease. 3. Aggressive secondary prevention.  [name], MD   Diagnostic Diagram        Patient Profile     83 y.o. male with a history of NICM, chronic combined syst/diast CHF, CKD, HTN, HL, DM, CML, IDA, and hypothyroidism, admitted 5/28 with chest pain and dyspnea, and now found to have severe LM and RCA dzs. He was transferred to [facility] for CVTS evaluation.   Assessment & Plan    1. USA/CAD: s/p prior LAD and RCA stenting @ [facility]. Admitted with a 2+ month h/o progressive DOE and exertional angina. Trops nl. Cath yesterday revealed 70% ostial LM and prox/mid RCA dzs. He was transferred to [facility] for surgical consult, but felt to not be a good candidate.  - he has not had any CP but has intermittent SOB with ambulation.  Likely related to ischemia but also have to consider Brilinta.  He has an allergy/intolerance to Plavix. - Plan is for  PCI Monday with possible Impella support.  - Continue on ASA 81mg  daily, IV Heparin gtt, high dose statin and Toprol 50mg  daily.    2. Chronic combined syst/diast CHF/NICM:  -EF 45-50% by echo this admission.  - He appears euvolemic on exam.  Weight is stable and actually down 7 lbs from admit. - He put out 1.45L yesterday and is net neg 1.55 since admit - creatinine bumped slightly  from 1.38 to 1.48 today (1.28 on admit). - will hold lasix until cath on Monday - repeat BMET on Monday - ACE I, dig and eplerenone on hold due to elevated creatinine.  3. Essential HTN: BP Stable. - continue Toprol.  4. HL:  - continue  high dose statin.  - Trig 338, will add fenofibrate 160mg  daily - will  need followup LFTs and FLP in 6 weeks.   5. CKD III:  - creatinine bumped slightly to 1.48 post cath - cont to hold acei/eplerenone.  6. PAF:  - maintaining NSR with no AF this admission.  - coumadin on hold for PCI - continue heparin bridge per pharmacy  7. DM II: Cont insulin/SSI.  Signed, Fransico Him, MD  08/17/2016, 11:16 AM

## 2016-08-18 DIAGNOSIS — R0602 Shortness of breath: Secondary | ICD-10-CM

## 2016-08-18 DIAGNOSIS — I481 Persistent atrial fibrillation: Secondary | ICD-10-CM

## 2016-08-18 DIAGNOSIS — I2583 Coronary atherosclerosis due to lipid rich plaque: Secondary | ICD-10-CM

## 2016-08-18 DIAGNOSIS — I2510 Atherosclerotic heart disease of native coronary artery without angina pectoris: Secondary | ICD-10-CM

## 2016-08-18 DIAGNOSIS — N189 Chronic kidney disease, unspecified: Secondary | ICD-10-CM

## 2016-08-18 DIAGNOSIS — N179 Acute kidney failure, unspecified: Secondary | ICD-10-CM

## 2016-08-18 LAB — HEPATIC FUNCTION PANEL
ALT: 16 U/L — AB (ref 17–63)
AST: 25 U/L (ref 15–41)
Albumin: 3.2 g/dL — ABNORMAL LOW (ref 3.5–5.0)
Alkaline Phosphatase: 87 U/L (ref 38–126)
BILIRUBIN DIRECT: 0.1 mg/dL (ref 0.1–0.5)
BILIRUBIN INDIRECT: 0.5 mg/dL (ref 0.3–0.9)
Total Bilirubin: 0.6 mg/dL (ref 0.3–1.2)
Total Protein: 7.6 g/dL (ref 6.5–8.1)

## 2016-08-18 LAB — CBC
HCT: 32.8 % — ABNORMAL LOW (ref 39.0–52.0)
Hemoglobin: 10.2 g/dL — ABNORMAL LOW (ref 13.0–17.0)
MCH: 25.5 pg — ABNORMAL LOW (ref 26.0–34.0)
MCHC: 31.1 g/dL (ref 30.0–36.0)
MCV: 82 fL (ref 78.0–100.0)
PLATELETS: 193 10*3/uL (ref 150–400)
RBC: 4 MIL/uL — ABNORMAL LOW (ref 4.22–5.81)
RDW: 16.2 % — ABNORMAL HIGH (ref 11.5–15.5)
WBC: 10.9 10*3/uL — ABNORMAL HIGH (ref 4.0–10.5)

## 2016-08-18 LAB — BASIC METABOLIC PANEL
ANION GAP: 12 (ref 5–15)
BUN: 26 mg/dL — ABNORMAL HIGH (ref 6–20)
CALCIUM: 9.2 mg/dL (ref 8.9–10.3)
CO2: 23 mmol/L (ref 22–32)
Chloride: 100 mmol/L — ABNORMAL LOW (ref 101–111)
Creatinine, Ser: 1.87 mg/dL — ABNORMAL HIGH (ref 0.61–1.24)
GFR, EST AFRICAN AMERICAN: 37 mL/min — AB (ref >60)
GFR, EST NON AFRICAN AMERICAN: 32 mL/min — AB (ref >60)
GLUCOSE: 155 mg/dL — AB (ref 65–99)
Potassium: 4 mmol/L (ref 3.5–5.1)
Sodium: 135 mmol/L (ref 135–145)

## 2016-08-18 LAB — GLUCOSE, CAPILLARY
Glucose-Capillary: 134 mg/dL — ABNORMAL HIGH (ref 65–99)
Glucose-Capillary: 149 mg/dL — ABNORMAL HIGH (ref 65–99)
Glucose-Capillary: 205 mg/dL — ABNORMAL HIGH (ref 65–99)
Glucose-Capillary: 191 mg/dL — ABNORMAL HIGH (ref 65–99)

## 2016-08-18 LAB — HEPARIN LEVEL (UNFRACTIONATED): Heparin Unfractionated: 0.51 IU/mL (ref 0.30–0.70)

## 2016-08-18 MED ORDER — SODIUM CHLORIDE 0.9 % IV SOLN
INTRAVENOUS | Status: DC
  Administered 2016-08-18 – 2016-08-19 (×2): via INTRAVENOUS

## 2016-08-18 MED ORDER — SODIUM CHLORIDE 0.9% FLUSH: 3.0000 mL | INTRAVENOUS | Status: DC | PRN

## 2016-08-18 MED ORDER — ASPIRIN 81 MG PO CHEW
81.0000 mg | CHEWABLE_TABLET | ORAL | Status: AC
  Administered 2016-08-19: 81 mg via ORAL
  Filled 2016-08-18: qty 1

## 2016-08-18 MED ORDER — SODIUM CHLORIDE 0.9 % IV SOLN: 250.0000 mL | INTRAVENOUS | Status: DC | PRN

## 2016-08-18 MED ORDER — SODIUM CHLORIDE 0.9% FLUSH
3.0000 mL | Freq: Two times a day (BID) | INTRAVENOUS | Status: DC
  Administered 2016-08-18 – 2016-08-19 (×2): 3 mL via INTRAVENOUS

## 2016-08-18 MED ORDER — SODIUM CHLORIDE 0.9 % IV SOLN: INTRAVENOUS | Status: DC

## 2016-08-18 NOTE — Progress Notes (Signed)
ANTICOAGULATION CONSULT NOTE - Follow Up Consult  Pharmacy Consult for Heparin Indication: chest pain/ACS and atrial fibrillation  Allergies  Allergen Reactions  . Clopidogrel Anaphylaxis    Other reaction(s): ANAPHYLAXIS;altered mental status;  (electrolytes "out of wack" and confusion per family; THEY DO NOT REMEMBER ANY OTHER REACTION)- MSB 10/10/15  . Ciprofloxacin Itching  . Lyrica [Pregabalin] Itching  . Spironolactone     Other reaction(s): Other (See Comments) gynecomastia  . Plavix [Clopidogrel Bisulfate] Anxiety    Patient Measurements: Height: 5\' 6"  (167.6 cm) Weight: 193 lb 14.4 oz (88 kg) IBW/kg (Calculated) : 63.8 Heparin Dosing Weight: 79 kg  Vital Signs: Temp: 98.6 F (37 C) (06/03 0535) Temp Source: Oral (06/03 0535) BP: 104/53 (06/03 0535) Pulse Rate: 74 (06/03 0535)  Labs:  Recent Labs  08/16/16 0329  08/16/16 1920 08/17/16 0013 08/17/16 0448 08/18/16 0205  HGB 9.8*  --  10.4*  --  10.8* 10.2*  HCT 31.8*  --  32.8*  --  34.2* 32.8*  PLT 182  --  203  --  206 193  LABPROT 17.4*  --   --   --   --   --   INR 1.41  --   --   --   --   --   HEPARINUNFRC 0.80*  < >  --  0.65 0.64 0.51  CREATININE 1.38*  --  1.48*  --   --  1.87*  TROPONINI  --   --  0.03*  --  0.03*  --   < > = values in this interval not displayed.  Estimated Creatinine Clearance: 32.8 mL/min (A) (by C-G formula based on SCr of 1.87 mg/dL (H)).  Assessment: 83 yr old male transferred from [facility] to [facility] for consideration for CABG.  S/p cardiac cath on 08/14/16. Dr. Prescott Gum recommends PCI rather than CABG due to comorbid medical problems. On Coumadin prior to admission for afib.  Continue to hold Coumadin pending plans for PCI Monday 6/4.  -Heparin level is noted at goal  Goal of Therapy:  Heparin level 0.3-0.7 units/ml Monitor platelets by anticoagulation protocol: Yes   Plan:  -No heparin changes needed -Daily heparin level and CBC  Hildred Laser, Pharm D 08/18/2016 11:20  AM

## 2016-08-18 NOTE — Progress Notes (Addendum)
Progress Note  Patient Name: Lucas Caldwell Date of Encounter: 08/18/2016  Primary Cardiologist: Andree Elk [facility])  Subjective   He denies any chest pain.He says SOB has improved  Inpatient Medications    Scheduled Meds: . allopurinol  300 mg Oral Daily  . aspirin EC  81 mg Oral BH-q7a  . atorvastatin  80 mg Oral q1800  . bosutinib  200 mg Oral Q supper  . famotidine  20 mg Oral QHS  . fenofibrate  160 mg Oral Daily  . insulin aspart  0-15 Units Subcutaneous TID WC  . insulin glargine  50 Units Subcutaneous QHS  . levothyroxine  25 mcg Oral QAC breakfast  . magnesium oxide  400 mg Oral BID  . metoprolol succinate  50 mg Oral BH-q7a  . mirtazapine  15 mg Oral QHS  . pantoprazole  40 mg Oral Daily  . ticagrelor  90 mg Oral BID   Continuous Infusions: . heparin 800 Units/hr (08/17/16 1104)   PRN Meds: albuterol, loperamide, nitroGLYCERIN, ondansetron (ZOFRAN) IV   Vital Signs    Vitals:   08/17/16 0442 08/17/16 1300 08/17/16 2047 08/18/16 0535  BP: (!) 141/60 (!) 132/56 (!) 159/88 (!) 104/53  Pulse: 68 66 78 74  Resp: 19 20 20 20   Temp: 98.3 F (36.8 C) 98.3 F (36.8 C) 97.9 F (36.6 C) 98.6 F (37 C)  TempSrc: Oral Oral Oral Oral  SpO2: 100% 99% 100% 99%  Weight: 195 lb 4.8 oz (88.6 kg)   193 lb 14.4 oz (88 kg)  Height:        Intake/Output Summary (Last 24 hours) at 08/18/16 1048 Last data filed at 08/17/16 2125  Gross per 24 hour  Intake              360 ml  Output              275 ml  Net               85 ml   Filed Weights   08/16/16 0531 08/17/16 0442 08/18/16 0535  Weight: 199 lb (90.3 kg) 195 lb 4.8 oz (88.6 kg) 193 lb 14.4 oz (88 kg)    Telemetry    NSR - Personally Reviewed  ECG    No new EKG to review - Personally Reviewed  Physical Exam   General: WD, WN in NAD Head: normal Neck: no JVD or bruit Lungs:  CTA bilaterally Heart:RRR with no M/R/G Abdomen: soft, NT, ND with active BS Extremities:no edema Neuro:A&O x3 Psych: normal  affect  Labs    Chemistry  Recent Labs Lab 08/16/16 0329 08/16/16 1920 08/18/16 0205  NA 137 134* 135  K 4.4 4.0 4.0  CL 104 100* 100*  CO2 24 24 23   GLUCOSE 161* 146* 155*  BUN 20 21* 26*  CREATININE 1.38* 1.48* 1.87*  CALCIUM 9.5 9.4 9.2  PROT  --   --  7.6  ALBUMIN  --   --  3.2*  AST  --   --  25  ALT  --   --  16*  ALKPHOS  --   --  87  BILITOT  --   --  0.6  GFRNONAA 47* 43* 32*  GFRAA 54* 50* 37*  ANIONGAP 9 10 12      Hematology  Recent Labs Lab 08/16/16 1920 08/17/16 0448 08/18/16 0205  WBC 8.9 10.8* 10.9*  RBC 3.94* 4.16* 4.00*  HGB 10.4* 10.8* 10.2*  HCT 32.8* 34.2* 32.8*  MCV  83.2 82.2 82.0  MCH 26.4 26.0 25.5*  MCHC 31.7 31.6 31.1  RDW 16.5* 16.6* 16.2*  PLT 203 206 193    Cardiac Enzymes  Recent Labs Lab 08/12/16 1955 08/13/16 0202 08/16/16 1920 08/17/16 0448  TROPONINI <0.03 <0.03 0.03* 0.03*   No results for input(s): TROPIPOC in the last 168 hours.   BNP  Recent Labs Lab 08/12/16 0851  BNP 95.0     DDimer No results for input(s): DDIMER in the last 168 hours.    Radiology    Dg Chest Port 1 View  Result Date: 08/16/2016 CLINICAL DATA:  Shortness of breath EXAM: PORTABLE CHEST 1 VIEW COMPARISON:  08/12/2016 FINDINGS: Chronic cardiomegaly and hyperinflation. Chronic mild interstitial coarsening. There is no edema, consolidation, effusion, or pneumothorax. Stable aortic and hilar contours. No acute osseous finding. IMPRESSION: 1. No acute finding. 2. Chronic cardiomegaly and hyperinflation. Electronically Signed   By: Monte Fantasia M.D.   On: 08/16/2016 19:16    Cardiac Studies   Echo: 08/12/16  Study Conclusions  - Left ventricle: The cavity size was mildly dilated. There was   mild concentric hypertrophy. Systolic function was mildly   reduced. The estimated ejection fraction was in the range of 45%   to 50%. Possible hypokinesis of the inferior myocardium. Left   ventricular diastolic function parameters were  normal. - Mitral valve: There was mild regurgitation. - Left atrium: The atrium was mildly dilated. - Pulmonary arteries: Systolic pressure could not be accurately   estimated.  LHC: 08/14/16  Conclusion   Conclusions: 1. Significant multivessel coronary artery disease, including 70% ostial LMCA stenosis (with catheter dampening) and 95% proximal/mid RCA lesion. 2. Patent stents in the mid LCx and mid RCA. 3. Upper normal left ventricular filling pressure.  Recommendations: 1. Given LMCA and RCA disease in the setting of diabetes mellitus, surgical consultation for CABG should be considered. However, if comorbidities preclude surgical intervention, PCI to unprotected LMCA and mid RCA could be considered. 2. Medical therapy, including gentle hydration, given chronic kidney disease. 3. Aggressive secondary prevention.  [name], MD   Diagnostic Diagram        Patient Profile     83 y.o. male with a history of NICM, chronic combined syst/diast CHF, CKD, HTN, HL, DM, CML, IDA, and hypothyroidism, admitted 5/28 with chest pain and dyspnea, and now found to have severe LM and RCA dzs. He was transferred to [facility] for CVTS evaluation.   Assessment & Plan    1. USA/CAD: s/p prior LAD and RCA stenting @ [facility]. Admitted with a 2+ month h/o progressive DOE and exertional angina. Trops nl. Cath 6/1 revealed 70% ostial LM and prox/mid RCA dzs. He was transferred to [facility] for surgical consult, but felt to not be a good candidate.  - he has not had any CP but has intermittent SOB with ambulation.  Likely related to ischemia but also have to consider Brilinta.  He has an allergy/intolerance to Plavix. - Plan for PCI Monday with possible Impella support pending repeat creatinine in am. - Creatinine bumped despite hold diuretics.  Will gently hydrate overnight - Continue on ASA 81mg  daily, IV Heparin gtt, high dose statin and Toprol 50mg  daily.    2. Chronic combined syst/diast  CHF/NICM:  -EF 45-50% by echo this admission.  - He appears euvolemic on exam.  Weight is stable and actually down 7 lbs from admit. - He put out 275cc yesterday and is net neg 1.22 since admit - creatinine bumped more  this am 1.38>>1.48>>1.87  (1.28 on admit). - weight continues to trend downward (202>199>195>193) - lasix on hold - will gently hydrate overnight - repeat BMET on Monday - ACE I, dig and eplerenone on hold due to elevated creatinine.  3. Essential HTN: BP Stable. - continue Toprol.  4. HL:  - continue  high dose statin.  - Trig 338 - added fenofibrate 160mg  daily - will need followup LFTs and FLP in 6 weeks.   5. CKD III:  - creatinine continues to trend upward.   - lasix on hold - cont to hold acei/eplerenone. - will gently hydrate overnight and repeat BMET in am  6. PAF:  - maintaining NSR with no AF this admission.  - coumadin on hold for PCI - continue heparin bridge per pharmacy  7. DM II: Cont insulin/SSI.  Signed, Fransico Him, MD  08/18/2016, 10:48 AM

## 2016-08-19 ENCOUNTER — Other Ambulatory Visit: Payer: Self-pay

## 2016-08-19 ENCOUNTER — Encounter (HOSPITAL_COMMUNITY)
Admission: AD | Disposition: A | Discharge status: Home / Self Care | Payer: Self-pay | Source: Other Acute Inpatient Hospital | Attending: Internal Medicine

## 2016-08-19 ENCOUNTER — Ambulatory Visit (HOSPITAL_COMMUNITY): Admission: RE | Admit: 2016-08-19 | Payer: Medicare HMO | Source: Ambulatory Visit | Admitting: Internal Medicine

## 2016-08-19 DIAGNOSIS — I5021 Acute systolic (congestive) heart failure: Secondary | ICD-10-CM

## 2016-08-19 DIAGNOSIS — I25118 Atherosclerotic heart disease of native coronary artery with other forms of angina pectoris: Secondary | ICD-10-CM

## 2016-08-19 HISTORY — PX: CORONARY STENT INTERVENTION: CATH118234

## 2016-08-19 LAB — GLUCOSE, CAPILLARY
GLUCOSE-CAPILLARY: 160 mg/dL — AB (ref 65–99)
GLUCOSE-CAPILLARY: 176 mg/dL — AB (ref 65–99)
Glucose-Capillary: 100 mg/dL — ABNORMAL HIGH (ref 65–99)
Glucose-Capillary: 90 mg/dL (ref 65–99)

## 2016-08-19 LAB — POCT ACTIVATED CLOTTING TIME
ACTIVATED CLOTTING TIME: 318 s
Activated Clotting Time: 290 seconds

## 2016-08-19 LAB — CBC
HCT: 33.1 % — ABNORMAL LOW (ref 39.0–52.0)
Hemoglobin: 10.3 g/dL — ABNORMAL LOW (ref 13.0–17.0)
MCH: 25.8 pg — AB (ref 26.0–34.0)
MCHC: 31.1 g/dL (ref 30.0–36.0)
MCV: 82.8 fL (ref 78.0–100.0)
Platelets: 195 10*3/uL (ref 150–400)
RBC: 4 MIL/uL — ABNORMAL LOW (ref 4.22–5.81)
RDW: 16.2 % — AB (ref 11.5–15.5)
WBC: 10.2 10*3/uL (ref 4.0–10.5)

## 2016-08-19 LAB — BASIC METABOLIC PANEL
Anion gap: 9 (ref 5–15)
BUN: 23 mg/dL — AB (ref 6–20)
CALCIUM: 9.2 mg/dL (ref 8.9–10.3)
CO2: 22 mmol/L (ref 22–32)
CREATININE: 1.71 mg/dL — AB (ref 0.61–1.24)
Chloride: 104 mmol/L (ref 101–111)
GFR calc non Af Amer: 36 mL/min — ABNORMAL LOW (ref >60)
GFR, EST AFRICAN AMERICAN: 42 mL/min — AB (ref >60)
Glucose, Bld: 178 mg/dL — ABNORMAL HIGH (ref 65–99)
Potassium: 3.8 mmol/L (ref 3.5–5.1)
SODIUM: 135 mmol/L (ref 135–145)

## 2016-08-19 LAB — HEPARIN LEVEL (UNFRACTIONATED): HEPARIN UNFRACTIONATED: 0.36 [IU]/mL (ref 0.30–0.70)

## 2016-08-19 SURGERY — CORONARY STENT INTERVENTION
Anesthesia: LOCAL

## 2016-08-19 MED ORDER — SODIUM CHLORIDE 0.9 % IV SOLN: 250.0000 mL | INTRAVENOUS | Status: DC | PRN

## 2016-08-19 MED ORDER — SODIUM CHLORIDE 0.9 % IV SOLN: INTRAVENOUS | Status: AC

## 2016-08-19 MED ORDER — NITROGLYCERIN 1 MG/10 ML FOR IR/CATH LAB
INTRA_ARTERIAL | Status: DC | PRN
  Administered 2016-08-19: 200 ug via INTRACORONARY

## 2016-08-19 MED ORDER — FENTANYL CITRATE (PF) 100 MCG/2ML IJ SOLN
INTRAMUSCULAR | Status: DC | PRN
Start: 2016-08-19 — End: 2016-08-19
  Administered 2016-08-19: 25 ug via INTRAVENOUS

## 2016-08-19 MED ORDER — HEPARIN (PORCINE) IN NACL 2-0.9 UNIT/ML-% IJ SOLN
INTRAMUSCULAR | Status: AC
  Filled 2016-08-19: qty 1000

## 2016-08-19 MED ORDER — FENTANYL CITRATE (PF) 100 MCG/2ML IJ SOLN
INTRAMUSCULAR | Status: AC
  Filled 2016-08-19: qty 2

## 2016-08-19 MED ORDER — VERAPAMIL HCL 2.5 MG/ML IV SOLN
INTRAVENOUS | Status: AC
  Filled 2016-08-19: qty 2

## 2016-08-19 MED ORDER — HEPARIN SODIUM (PORCINE) 5000 UNIT/ML IJ SOLN
5000.0000 [IU] | Freq: Three times a day (TID) | INTRAMUSCULAR | Status: DC
  Administered 2016-08-20: 5000 [IU] via SUBCUTANEOUS
  Filled 2016-08-19: qty 1

## 2016-08-19 MED ORDER — IOPAMIDOL (ISOVUE-370) INJECTION 76%
INTRAVENOUS | Status: DC | PRN
  Administered 2016-08-19: 70 mL via INTRA_ARTERIAL

## 2016-08-19 MED ORDER — SODIUM CHLORIDE 0.9% FLUSH
3.0000 mL | Freq: Two times a day (BID) | INTRAVENOUS | Status: DC
  Administered 2016-08-19 – 2016-08-20 (×2): 3 mL via INTRAVENOUS

## 2016-08-19 MED ORDER — LIDOCAINE HCL (PF) 1 % IJ SOLN
INTRAMUSCULAR | Status: DC | PRN
  Administered 2016-08-19: 1 mL via INTRADERMAL

## 2016-08-19 MED ORDER — WARFARIN SODIUM 5 MG PO TABS
5.0000 mg | ORAL_TABLET | Freq: Once | ORAL | Status: AC
  Administered 2016-08-19: 5 mg via ORAL
  Filled 2016-08-19: qty 1

## 2016-08-19 MED ORDER — HYDRALAZINE HCL 20 MG/ML IJ SOLN: 5.0000 mg | INTRAMUSCULAR | Status: AC | PRN

## 2016-08-19 MED ORDER — HEPARIN SODIUM (PORCINE) 1000 UNIT/ML IJ SOLN
INTRAMUSCULAR | Status: DC | PRN
  Administered 2016-08-19: 8000 [IU] via INTRAVENOUS
  Administered 2016-08-19: 2000 [IU] via INTRAVENOUS

## 2016-08-19 MED ORDER — VERAPAMIL HCL 2.5 MG/ML IV SOLN
INTRAVENOUS | Status: DC | PRN
  Administered 2016-08-19: 10 mL via INTRA_ARTERIAL

## 2016-08-19 MED ORDER — MIDAZOLAM HCL 2 MG/2ML IJ SOLN
INTRAMUSCULAR | Status: AC
  Filled 2016-08-19: qty 2

## 2016-08-19 MED ORDER — NITROGLYCERIN 1 MG/10 ML FOR IR/CATH LAB
INTRA_ARTERIAL | Status: AC
  Filled 2016-08-19: qty 10

## 2016-08-19 MED ORDER — ALUM & MAG HYDROXIDE-SIMETH 200-200-20 MG/5ML PO SUSP
30.0000 mL | Freq: Four times a day (QID) | ORAL | Status: DC | PRN
  Administered 2016-08-19: 30 mL via ORAL
  Filled 2016-08-19: qty 30

## 2016-08-19 MED ORDER — HEPARIN (PORCINE) IN NACL 2-0.9 UNIT/ML-% IJ SOLN
INTRAMUSCULAR | Status: AC | PRN
  Administered 2016-08-19: 1000 mL via INTRA_ARTERIAL

## 2016-08-19 MED ORDER — IOPAMIDOL (ISOVUE-370) INJECTION 76%
INTRAVENOUS | Status: AC
  Filled 2016-08-19: qty 100

## 2016-08-19 MED ORDER — HEPARIN SODIUM (PORCINE) 1000 UNIT/ML IJ SOLN
INTRAMUSCULAR | Status: AC
  Filled 2016-08-19: qty 1

## 2016-08-19 MED ORDER — SODIUM CHLORIDE 0.9% FLUSH: 3.0000 mL | INTRAVENOUS | Status: DC | PRN

## 2016-08-19 MED ORDER — MIDAZOLAM HCL 2 MG/2ML IJ SOLN
INTRAMUSCULAR | Status: DC | PRN
  Administered 2016-08-19: 0.5 mg via INTRAVENOUS

## 2016-08-19 MED ORDER — WARFARIN - PHARMACIST DOSING INPATIENT: Freq: Every day | Status: DC

## 2016-08-19 SURGICAL SUPPLY — 19 items
BALLN EMERGE MR 2.5X12 (BALLOONS) ×2
BALLN ~~LOC~~ EMERGE MR 3.25X15 (BALLOONS) ×2
BALLOON EMERGE MR 2.5X12 (BALLOONS) ×1 IMPLANT
BALLOON ~~LOC~~ EMERGE MR 3.25X15 (BALLOONS) ×1 IMPLANT
CATH LAUNCHER 6FR AL.75 (CATHETERS) ×2 IMPLANT
COVER PRB 48X5XTLSCP FOLD TPE (BAG) ×1 IMPLANT
COVER PROBE 5X48 (BAG) ×1
DEVICE RAD COMP TR BAND LRG (VASCULAR PRODUCTS) ×2 IMPLANT
GLIDESHEATH SLEND SS 6F .021 (SHEATH) ×2 IMPLANT
GUIDEWIRE INQWIRE 1.5J.035X260 (WIRE) ×1 IMPLANT
INQWIRE 1.5J .035X260CM (WIRE) ×2
KIT ENCORE 26 ADVANTAGE (KITS) ×4 IMPLANT
KIT HEART LEFT (KITS) ×2 IMPLANT
PACK CARDIAC CATHETERIZATION (CUSTOM PROCEDURE TRAY) ×2 IMPLANT
STENT SYNERGY DES 3X28 (Permanent Stent) ×2 IMPLANT
TRANSDUCER W/STOPCOCK (MISCELLANEOUS) ×2 IMPLANT
TUBING CIL FLEX 10 FLL-RA (TUBING) ×2 IMPLANT
WIRE HI TORQ VERSACORE-J 145CM (WIRE) ×2 IMPLANT
WIRE RUNTHROUGH .014X180CM (WIRE) ×2 IMPLANT

## 2016-08-19 NOTE — Brief Op Note (Signed)
Brief Cardiac Catheterization Note  Date: 08/19/2016 Time: 4:25 PM  PATIENT:  Lucas Caldwell  83 y.o. male  PRE-OPERATIVE DIAGNOSIS:  Unstable angina  POST-OPERATIVE DIAGNOSIS:  Same  PROCEDURE:  Procedure(s): Coronary Stent Intervention (N/A)  SURGEON:  Surgeon(s) and Role:    * Rayyan Orsborn, MD - Primary  FINDINGS: 1. 95% proximal/mid RCA stent immediately proximal to previously placed stent. 2. Successful PCI to proximal/mid RCA with placement of Synergy 3.0 x 28 mm DES with 0% residual stenosis and TIMI-3 flow.  RECOMMENDATIONS: 1. Warfarin, ticagrelor, and aspirin x 1 month, followed by warfarin and ticagrelor for at least a total of 3 months (longer if tolerated). 2. Medical management of non-critical LMCA stenosis.  [name], MD [facility] Pager: [phone]

## 2016-08-19 NOTE — H&P (View-Only) (Signed)
The patient has been seen in conjunction with Reino Bellis, NP. All aspects of care have been considered and discussed. The patient has been personally interviewed, examined, and all clinical data has been reviewed.   Digital images were reviewed with Dr. Saunders Revel. Agree with plan for culprit PCI on RCA and treating left main with medical therapy. His predominant symptom is exertional dyspnea but there is also chest tightness involved.  If symptoms persist after RCA PCI, may need to have left main ostial stent. This could probably be done without hemodynamic support.  IV hydration and minimize contrast during procedure today to avoid further kidney injury.  Progress Note  Patient Name: Lucas Caldwell Date of Encounter: 08/19/2016  Primary Cardiologist: Andree Elk [facility])  Subjective   No chest pain.   Inpatient Medications    Scheduled Meds: . allopurinol  300 mg Oral Daily  . aspirin EC  81 mg Oral BH-q7a  . atorvastatin  80 mg Oral q1800  . bosutinib  200 mg Oral Q supper  . famotidine  20 mg Oral QHS  . fenofibrate  160 mg Oral Daily  . insulin aspart  0-15 Units Subcutaneous TID WC  . insulin glargine  50 Units Subcutaneous QHS  . levothyroxine  25 mcg Oral QAC breakfast  . magnesium oxide  400 mg Oral BID  . metoprolol succinate  50 mg Oral BH-q7a  . mirtazapine  15 mg Oral QHS  . pantoprazole  40 mg Oral Daily  . sodium chloride flush  3 mL Intravenous Q12H  . ticagrelor  90 mg Oral BID   Continuous Infusions: . sodium chloride    . sodium chloride 10 mL/hr at 08/19/16 0000  . sodium chloride 50 mL/hr at 08/18/16 1959  . heparin 800 Units/hr (08/18/16 1958)   PRN Meds: sodium chloride, albuterol, loperamide, nitroGLYCERIN, ondansetron (ZOFRAN) IV, sodium chloride flush   Vital Signs    Vitals:   08/18/16 0535 08/18/16 1300 08/18/16 2114 08/19/16 0344  BP: (!) 104/53 (!) 122/58 115/64 135/66  Pulse: 74 65 71 78  Resp: 20 20 18 18   Temp: 98.6 F (37 C) 99.3 F  (37.4 C) 99.3 F (37.4 C) 98.7 F (37.1 C)  TempSrc: Oral Oral Oral Oral  SpO2: 99% 100% 97% 99%  Weight: 193 lb 14.4 oz (88 kg)   194 lb 3.2 oz (88.1 kg)  Height:        Intake/Output Summary (Last 24 hours) at 08/19/16 1107 Last data filed at 08/19/16 0830  Gross per 24 hour  Intake           890.04 ml  Output              725 ml  Net           165.04 ml   Filed Weights   08/17/16 0442 08/18/16 0535 08/19/16 0344  Weight: 195 lb 4.8 oz (88.6 kg) 193 lb 14.4 oz (88 kg) 194 lb 3.2 oz (88.1 kg)    Telemetry    SR with freq PVCs - Personally Reviewed  ECG    N/a - Personally Reviewed  Physical Exam   General: Well developed, well nourished, male appearing in no acute distress. Head: Normocephalic, atraumatic.  Neck: Supple without bruits, JVD. Lungs:  Resp regular and unlabored, CTA. Heart: RRR, S1, S2, no S3, S4, or murmur; no rub. Abdomen: Soft, non-tender, non-distended with normoactive bowel sounds. No hepatomegaly. No rebound/guarding. No obvious abdominal masses. Extremities: No clubbing, cyanosis, edema. Distal pedal  pulses are 2+ bilaterally. Neuro: Alert and oriented X 3. Moves all extremities spontaneously. Psych: Normal affect.  Labs    Chemistry Recent Labs Lab 08/16/16 1920 08/18/16 0205 08/19/16 0349  NA 134* 135 135  K 4.0 4.0 3.8  CL 100* 100* 104  CO2 24 23 22   GLUCOSE 146* 155* 178*  BUN 21* 26* 23*  CREATININE 1.48* 1.87* 1.71*  CALCIUM 9.4 9.2 9.2  PROT  --  7.6  --   ALBUMIN  --  3.2*  --   AST  --  25  --   ALT  --  16*  --   ALKPHOS  --  87  --   BILITOT  --  0.6  --   GFRNONAA 43* 32* 36*  GFRAA 50* 37* 42*  ANIONGAP 10 12 9      Hematology Recent Labs Lab 08/17/16 0448 08/18/16 0205 08/19/16 0349  WBC 10.8* 10.9* 10.2  RBC 4.16* 4.00* 4.00*  HGB 10.8* 10.2* 10.3*  HCT 34.2* 32.8* 33.1*  MCV 82.2 82.0 82.8  MCH 26.0 25.5* 25.8*  MCHC 31.6 31.1 31.1  RDW 16.6* 16.2* 16.2*  PLT 206 193 195    Cardiac  Enzymes Recent Labs Lab 08/12/16 1955 08/13/16 0202 08/16/16 1920 08/17/16 0448  TROPONINI <0.03 <0.03 0.03* 0.03*   No results for input(s): TROPIPOC in the last 168 hours.   BNPNo results for input(s): BNP, PROBNP in the last 168 hours.   DDimer No results for input(s): DDIMER in the last 168 hours.    Radiology    No results found.  Cardiac Studies   Echo: 08/12/16  Study Conclusions  - Left ventricle: The cavity size was mildly dilated. There was mild concentric hypertrophy. Systolic function was mildly reduced. The estimated ejection fraction was in the range of 45% to 50%. Possible hypokinesis of the inferior myocardium. Left ventricular diastolic function parameters were normal. - Mitral valve: There was mild regurgitation. - Left atrium: The atrium was mildly dilated. - Pulmonary arteries: Systolic pressure could not be accurately estimated.  LHC: 08/14/16  Conclusion   Conclusions: 1. Significant multivessel coronary artery disease, including 70% ostial LMCA stenosis (with catheter dampening) and 95% proximal/mid RCA lesion. 2. Patent stents in the mid LCx and mid RCA. 3. Upper normal left ventricular filling pressure.  Recommendations: 1. Given LMCA and RCA disease in the setting of diabetes mellitus, surgical consultation for CABG should be considered. However, if comorbidities preclude surgical intervention, PCI to unprotected LMCA and mid RCA could be considered. 2. Medical therapy, including gentle hydration, given chronic kidney disease. 3. Aggressive secondary prevention.  [name], MD   Diagnostic Diagram        Patient Profile     83 y.o. male with a history of NICM, chronic combined syst/diast CHF, CKD, HTN, HL, DM, CML, IDA, and hypothyroidism, admitted 5/28 with chest pain and dyspnea, and now found to have severe LM and RCAdzs. He was transferred to [facility] for CVTS evaluation.  Assessment & Plan    1.  USA/CAD: s/p prior LAD and RCA stenting @ [facility]. Admitted with a 2+ month h/o progressive DOE and exertional angina. Trops nl. Cath 6/1 revealed 70% ostial LM and prox/mid RCA dzs. He was transferred to [facility] for surgical consult, but felt to not be a good candidate.  - he has not had any CP but has intermittent SOB with ambulation. Initially had some intermittent SOB with ambulation, improved now. ? Related to Brilinta.  - Plan for PCI  today to RCA, though Cr is still mildly elevated at 1.7. Receiving NS at 50cc.hr - Continue on ASA 81mg  daily, IV Heparin gtt, high dose statin and Toprol 50mg  daily.    2. Chronic combined syst/diast CHF/NICM:  -EF 45-50% by echo this admission.  - He appears euvolemic on exam.  Weight is stable, up 1 lb overnight with diuretic held.  - creatinine bumped more this am 1.38>>1.48>>1.87>>1.7  (1.28 on admit). - lasix on hold - follow BMET - ACE I, dig and eplerenone on hold due to elevated creatinine.  3. Essential HTN: BP Stable. - continue Toprol.  4. HL:  - continue  high dose statin.  - Trig 338 - now on fenofibrate 160mg  daily - will need followup LFTs and FLP in 6 weeks.   5. CKD III:  - improved today at 1.7 after gentle IV hydration - lasix on hold - cont to hold acei/eplerenone.  6. PAF:  - maintaining NSR with no AF this admission.  - coumadin on hold for PCI - continue heparin bridge per pharmacy  7. DM II: Cont insulin/SSI.  Signed, Reino Bellis, NP  08/19/2016, 11:07 AM

## 2016-08-19 NOTE — Progress Notes (Signed)
CARDIAC REHAB PHASE I   PRE:  Rate/Rhythm: 68 SR PVCs  BP:  Supine:   Sitting: 125/61  Standing:    SaO2: 99%RA  MODE:  Ambulation: 185 ft   POST:  Rate/Rhythm: 67 SR PVCs  BP:  Supine:   Sitting: 135/57  Standing:    SaO2: 99%RA 1040-1105 Pt walked 185 ft pushing IV pole with steady gait. Tolerated well. A little SOB at end of walk. No CP. To  recliner with call bell.   Graylon Good, RN BSN  08/19/2016 11:47 AM

## 2016-08-19 NOTE — Interval H&P Note (Signed)
History and Physical Interval Note:  08/19/2016 2:51 PM  Lucas Caldwell  has presented today for cardiac catheterization, with the diagnosis of unstable angina. The various methods of treatment have been discussed with the patient and family. After consideration of risks, benefits and other options for treatment, the patient has consented to  Procedure(s): Coronary Stent Intervention (N/A) as a surgical intervention .  The patient's history has been reviewed, patient examined, no change in status, stable for surgery.  I have reviewed the patient's chart and labs.  Questions were answered to the patient's satisfaction.  We will proceed with PCI to the proximal/mid RCA and medical therapy of the non-critical LMCA stenosis.  Cath Lab Visit (complete for each Cath Lab visit)  Clinical Evaluation Leading to the Procedure:   ACS: Yes.    Non-ACS:    Anginal Classification: CCS IV  Anti-ischemic medical therapy: Minimal Therapy (1 class of medications)  Non-Invasive Test Results: No non-invasive testing performed  Prior CABG: No previous CABG  Lucas Caldwell

## 2016-08-19 NOTE — Care Management Important Message (Signed)
Important Message  Patient Details  Name: Lucas Caldwell MRN: [id number] Date of Birth: [birth year]   Medicare Important Message Given:  Yes    Nathen May 08/19/2016, 12:34 PM

## 2016-08-19 NOTE — Progress Notes (Signed)
ANTICOAGULATION CONSULT NOTE - Follow Up Consult  Pharmacy Consult for Heparin Indication: chest pain/ACS and atrial fibrillation  Allergies  Allergen Reactions  . Clopidogrel Anaphylaxis    Other reaction(s): ANAPHYLAXIS;altered mental status;  (electrolytes "out of wack" and confusion per family; THEY DO NOT REMEMBER ANY OTHER REACTION)- MSB 10/10/15  . Ciprofloxacin Itching  . Lyrica [Pregabalin] Itching  . Spironolactone     Other reaction(s): Other (See Comments) gynecomastia  . Plavix [Clopidogrel Bisulfate] Anxiety    Patient Measurements: Height: 5\' 6"  (167.6 cm) Weight: 194 lb 3.2 oz (88.1 kg) IBW/kg (Calculated) : 63.8 Heparin Dosing Weight: 79 kg  Vital Signs: Temp: 98.7 F (37.1 C) (06/04 0344) Temp Source: Oral (06/04 0344) BP: 135/66 (06/04 0344) Pulse Rate: 78 (06/04 0344)  Labs:  Recent Labs  08/16/16 1920  08/17/16 0448 08/18/16 0205 08/19/16 0349  HGB 10.4*  --  10.8* 10.2* 10.3*  HCT 32.8*  --  34.2* 32.8* 33.1*  PLT 203  --  206 193 195  HEPARINUNFRC  --   < > 0.64 0.51 0.36  CREATININE 1.48*  --   --  1.87* 1.71*  TROPONINI 0.03*  --  0.03*  --   --   < > = values in this interval not displayed.  Estimated Creatinine Clearance: 35.8 mL/min (A) (by C-G formula based on SCr of 1.71 mg/dL (H)).  Assessment: 83 yr old male transferred from [facility] to [facility] for consideration for CABG.  S/p cardiac cath on 08/14/16. Dr. Prescott Gum recommends PCI rather than CABG due to comorbid medical problems. On Coumadin prior to admission for afib.  Continue to hold Coumadin pending plans for PCI Monday 6/4. CBC stable, no bleeding or infusion issues.  Heparin level therapeutic 0.36  Goal of Therapy:  Heparin level 0.3-0.7 units/ml Monitor platelets by anticoagulation protocol: Yes   Plan:  Continue Heparin gtt at 800 units/hr F/u restart heparin/ anticoagulation post PCI Monitor for s/sx of bleeding Daily heparin level, CBC, INR  Georga Bora,  PharmD Clinical Pharmacist 08/19/2016 8:20 AM

## 2016-08-19 NOTE — Progress Notes (Addendum)
1707- present:  Pt c/o CP 8/10, EKG done. Treatment w ntg SL x3 with ending pain level of 4/10.  Pt doesn't seem to be in any distress.  EKG results and ectopy same as prior.  Other VSS. Notified Dr. Saunders Revel.

## 2016-08-19 NOTE — Progress Notes (Signed)
The patient has been seen in conjunction with Reino Bellis, NP. All aspects of care have been considered and discussed. The patient has been personally interviewed, examined, and all clinical data has been reviewed.   Digital images were reviewed with Dr. Saunders Revel. Agree with plan for culprit PCI on RCA and treating left main with medical therapy. His predominant symptom is exertional dyspnea but there is also chest tightness involved.  If symptoms persist after RCA PCI, may need to have left main ostial stent. This could probably be done without hemodynamic support.  IV hydration and minimize contrast during procedure today to avoid further kidney injury.  Progress Note  Patient Name: Lucas Caldwell Date of Encounter: 08/19/2016  Primary Cardiologist: Andree Elk [facility])  Subjective   No chest pain.   Inpatient Medications    Scheduled Meds: . allopurinol  300 mg Oral Daily  . aspirin EC  81 mg Oral BH-q7a  . atorvastatin  80 mg Oral q1800  . bosutinib  200 mg Oral Q supper  . famotidine  20 mg Oral QHS  . fenofibrate  160 mg Oral Daily  . insulin aspart  0-15 Units Subcutaneous TID WC  . insulin glargine  50 Units Subcutaneous QHS  . levothyroxine  25 mcg Oral QAC breakfast  . magnesium oxide  400 mg Oral BID  . metoprolol succinate  50 mg Oral BH-q7a  . mirtazapine  15 mg Oral QHS  . pantoprazole  40 mg Oral Daily  . sodium chloride flush  3 mL Intravenous Q12H  . ticagrelor  90 mg Oral BID   Continuous Infusions: . sodium chloride    . sodium chloride 10 mL/hr at 08/19/16 0000  . sodium chloride 50 mL/hr at 08/18/16 1959  . heparin 800 Units/hr (08/18/16 1958)   PRN Meds: sodium chloride, albuterol, loperamide, nitroGLYCERIN, ondansetron (ZOFRAN) IV, sodium chloride flush   Vital Signs    Vitals:   08/18/16 0535 08/18/16 1300 08/18/16 2114 08/19/16 0344  BP: (!) 104/53 (!) 122/58 115/64 135/66  Pulse: 74 65 71 78  Resp: 20 20 18 18   Temp: 98.6 F (37 C) 99.3 F  (37.4 C) 99.3 F (37.4 C) 98.7 F (37.1 C)  TempSrc: Oral Oral Oral Oral  SpO2: 99% 100% 97% 99%  Weight: 193 lb 14.4 oz (88 kg)   194 lb 3.2 oz (88.1 kg)  Height:        Intake/Output Summary (Last 24 hours) at 08/19/16 1107 Last data filed at 08/19/16 0830  Gross per 24 hour  Intake           890.04 ml  Output              725 ml  Net           165.04 ml   Filed Weights   08/17/16 0442 08/18/16 0535 08/19/16 0344  Weight: 195 lb 4.8 oz (88.6 kg) 193 lb 14.4 oz (88 kg) 194 lb 3.2 oz (88.1 kg)    Telemetry    SR with freq PVCs - Personally Reviewed  ECG    N/a - Personally Reviewed  Physical Exam   General: Well developed, well nourished, male appearing in no acute distress. Head: Normocephalic, atraumatic.  Neck: Supple without bruits, JVD. Lungs:  Resp regular and unlabored, CTA. Heart: RRR, S1, S2, no S3, S4, or murmur; no rub. Abdomen: Soft, non-tender, non-distended with normoactive bowel sounds. No hepatomegaly. No rebound/guarding. No obvious abdominal masses. Extremities: No clubbing, cyanosis, edema. Distal pedal  pulses are 2+ bilaterally. Neuro: Alert and oriented X 3. Moves all extremities spontaneously. Psych: Normal affect.  Labs    Chemistry Recent Labs Lab 08/16/16 1920 08/18/16 0205 08/19/16 0349  NA 134* 135 135  K 4.0 4.0 3.8  CL 100* 100* 104  CO2 24 23 22   GLUCOSE 146* 155* 178*  BUN 21* 26* 23*  CREATININE 1.48* 1.87* 1.71*  CALCIUM 9.4 9.2 9.2  PROT  --  7.6  --   ALBUMIN  --  3.2*  --   AST  --  25  --   ALT  --  16*  --   ALKPHOS  --  87  --   BILITOT  --  0.6  --   GFRNONAA 43* 32* 36*  GFRAA 50* 37* 42*  ANIONGAP 10 12 9      Hematology Recent Labs Lab 08/17/16 0448 08/18/16 0205 08/19/16 0349  WBC 10.8* 10.9* 10.2  RBC 4.16* 4.00* 4.00*  HGB 10.8* 10.2* 10.3*  HCT 34.2* 32.8* 33.1*  MCV 82.2 82.0 82.8  MCH 26.0 25.5* 25.8*  MCHC 31.6 31.1 31.1  RDW 16.6* 16.2* 16.2*  PLT 206 193 195    Cardiac  Enzymes Recent Labs Lab 08/12/16 1955 08/13/16 0202 08/16/16 1920 08/17/16 0448  TROPONINI <0.03 <0.03 0.03* 0.03*   No results for input(s): TROPIPOC in the last 168 hours.   BNPNo results for input(s): BNP, PROBNP in the last 168 hours.   DDimer No results for input(s): DDIMER in the last 168 hours.    Radiology    No results found.  Cardiac Studies   Echo: 08/12/16  Study Conclusions  - Left ventricle: The cavity size was mildly dilated. There was mild concentric hypertrophy. Systolic function was mildly reduced. The estimated ejection fraction was in the range of 45% to 50%. Possible hypokinesis of the inferior myocardium. Left ventricular diastolic function parameters were normal. - Mitral valve: There was mild regurgitation. - Left atrium: The atrium was mildly dilated. - Pulmonary arteries: Systolic pressure could not be accurately estimated.  LHC: 08/14/16  Conclusion   Conclusions: 1. Significant multivessel coronary artery disease, including 70% ostial LMCA stenosis (with catheter dampening) and 95% proximal/mid RCA lesion. 2. Patent stents in the mid LCx and mid RCA. 3. Upper normal left ventricular filling pressure.  Recommendations: 1. Given LMCA and RCA disease in the setting of diabetes mellitus, surgical consultation for CABG should be considered. However, if comorbidities preclude surgical intervention, PCI to unprotected LMCA and mid RCA could be considered. 2. Medical therapy, including gentle hydration, given chronic kidney disease. 3. Aggressive secondary prevention.  [name], MD   Diagnostic Diagram        Patient Profile     83 y.o. male with a history of NICM, chronic combined syst/diast CHF, CKD, HTN, HL, DM, CML, IDA, and hypothyroidism, admitted 5/28 with chest pain and dyspnea, and now found to have severe LM and RCAdzs. He was transferred to [facility] for CVTS evaluation.  Assessment & Plan    1.  USA/CAD: s/p prior LAD and RCA stenting @ [facility]. Admitted with a 2+ month h/o progressive DOE and exertional angina. Trops nl. Cath 6/1 revealed 70% ostial LM and prox/mid RCA dzs. He was transferred to [facility] for surgical consult, but felt to not be a good candidate.  - he has not had any CP but has intermittent SOB with ambulation. Initially had some intermittent SOB with ambulation, improved now. ? Related to Brilinta.  - Plan for PCI  today to RCA, though Cr is still mildly elevated at 1.7. Receiving NS at 50cc.hr - Continue on ASA 81mg  daily, IV Heparin gtt, high dose statin and Toprol 50mg  daily.    2. Chronic combined syst/diast CHF/NICM:  -EF 45-50% by echo this admission.  - He appears euvolemic on exam.  Weight is stable, up 1 lb overnight with diuretic held.  - creatinine bumped more this am 1.38>>1.48>>1.87>>1.7  (1.28 on admit). - lasix on hold - follow BMET - ACE I, dig and eplerenone on hold due to elevated creatinine.  3. Essential HTN: BP Stable. - continue Toprol.  4. HL:  - continue  high dose statin.  - Trig 338 - now on fenofibrate 160mg  daily - will need followup LFTs and FLP in 6 weeks.   5. CKD III:  - improved today at 1.7 after gentle IV hydration - lasix on hold - cont to hold acei/eplerenone.  6. PAF:  - maintaining NSR with no AF this admission.  - coumadin on hold for PCI - continue heparin bridge per pharmacy  7. DM II: Cont insulin/SSI.  Signed, Reino Bellis, NP  08/19/2016, 11:07 AM

## 2016-08-19 NOTE — Progress Notes (Signed)
CARDIAC REHAB PHASE I   PRE:  Rate/Rhythm: 68 SR PVCs  BP:  Supine:   Sitting: 125/61  Standing:    SaO2: 99%RA  MODE:  Ambulation: 185 ft   POST:  Rate/Rhythm: 67 SR PVCs  BP:  Supine:   Sitting: 135/57  Standing:    SaO2: 99%RA 1040-1105 Pt walked 185 ft on RA pushing IV pole with steady gait. Tolerated well. Was able to go farther today than Saturday. Slightly SOB at the end of walk. To recliner. No c/o CP.   Graylon Good, RN BSN  08/19/2016 11:02 AM

## 2016-08-19 NOTE — Progress Notes (Signed)
TR band removed and gauze and tegaderm dressing to radial site level 0.

## 2016-08-19 NOTE — Progress Notes (Signed)
ANTICOAGULATION CONSULT NOTE - Follow Up Consult  Pharmacy Consult for warfarin Indication: atrial fibrillation  Allergies  Allergen Reactions  . Clopidogrel Anaphylaxis    Other reaction(s): ANAPHYLAXIS;altered mental status;  (electrolytes "out of wack" and confusion per family; THEY DO NOT REMEMBER ANY OTHER REACTION)- MSB 10/10/15  . Ciprofloxacin Itching  . Lyrica [Pregabalin] Itching  . Spironolactone     Other reaction(s): Other (See Comments) gynecomastia  . Plavix [Clopidogrel Bisulfate] Anxiety    Patient Measurements: Height: 5\' 6"  (167.6 cm) Weight: 194 lb 3.2 oz (88.1 kg) IBW/kg (Calculated) : 63.8 Heparin Dosing Weight: 79 kg  Vital Signs: Temp: 97.7 F (36.5 C) (06/04 1644) Temp Source: Oral (06/04 1644) BP: 135/74 (06/04 1644) Pulse Rate: 59 (06/04 1644)  Labs:  Recent Labs  08/16/16 1920  08/17/16 0448 08/18/16 0205 08/19/16 0349  HGB 10.4*  --  10.8* 10.2* 10.3*  HCT 32.8*  --  34.2* 32.8* 33.1*  PLT 203  --  206 193 195  HEPARINUNFRC  --   < > 0.64 0.51 0.36  CREATININE 1.48*  --   --  1.87* 1.71*  TROPONINI 0.03*  --  0.03*  --   --   < > = values in this interval not displayed.  Estimated Creatinine Clearance: 35.8 mL/min (A) (by C-G formula based on SCr of 1.71 mg/dL (H)).  Assessment: 83 yr old male transferred from [facility] to [facility] for consideration for CABG.  S/p cardiac cath on 08/14/16. Dr. Prescott Gum recommends PCI rather than CABG due to comorbid medical problems. On Coumadin prior to admission for afib.  Now s/p cath with stent and angioplasty to RCA. D/w cardiology and will restart warfarin tonight. INR 1.4 on 6/1 not checked this am, has been receiving heparin bridge.  Goal of Therapy:  Heparin level 0.3-0.7 units/ml Monitor platelets by anticoagulation protocol: Yes   Plan:  Warfarin 5mg  tonight Monitor for s/sx of bleeding Daily INR  Erin Hearing PharmD., BCPS Clinical Pharmacist Pager [phone] 08/19/2016 6:12 PM

## 2016-08-19 NOTE — Care Management Note (Signed)
Case Management Note  Patient Details  Name: Lucas Caldwell MRN: [id number] Date of Birth: 1933/03/30  Subjective/Objective:     From home, s/p coronary stent intervention, will be on brilinta, NCM awaiting benefit check.   PCP  Juanell Fairly            Action/Plan: NCM will follow for dc needs.  Expected Discharge Date:                  Expected Discharge Plan:  Home/Self Care  In-House Referral:     Discharge planning Services  CM Consult  Post Acute Care Choice:    Choice offered to:     DME Arranged:    DME Agency:     HH Arranged:    [place] Agency:     Status of Service:  Completed, signed off  If discussed at [employer] of Stay Meetings, dates discussed:    Additional Comments:  Zenon Mayo, RN 08/19/2016, 5:14 PM

## 2016-08-20 ENCOUNTER — Telehealth: Payer: Self-pay | Admitting: Internal Medicine

## 2016-08-20 ENCOUNTER — Encounter (HOSPITAL_COMMUNITY): Payer: Self-pay | Admitting: Internal Medicine

## 2016-08-20 DIAGNOSIS — I10 Essential (primary) hypertension: Secondary | ICD-10-CM

## 2016-08-20 LAB — CBC
HCT: 30.1 % — ABNORMAL LOW (ref 39.0–52.0)
Hemoglobin: 9.4 g/dL — ABNORMAL LOW (ref 13.0–17.0)
MCH: 25.8 pg — AB (ref 26.0–34.0)
MCHC: 31.2 g/dL (ref 30.0–36.0)
MCV: 82.5 fL (ref 78.0–100.0)
Platelets: 164 10*3/uL (ref 150–400)
RBC: 3.65 MIL/uL — ABNORMAL LOW (ref 4.22–5.81)
RDW: 16.3 % — AB (ref 11.5–15.5)
WBC: 14 10*3/uL — ABNORMAL HIGH (ref 4.0–10.5)

## 2016-08-20 LAB — GLUCOSE, CAPILLARY
GLUCOSE-CAPILLARY: 156 mg/dL — AB (ref 65–99)
Glucose-Capillary: 184 mg/dL — ABNORMAL HIGH (ref 65–99)

## 2016-08-20 LAB — PROTIME-INR
INR: 1.17
PROTHROMBIN TIME: 15 s (ref 11.4–15.2)

## 2016-08-20 LAB — BASIC METABOLIC PANEL
Anion gap: 11 (ref 5–15)
BUN: 23 mg/dL — AB (ref 6–20)
CALCIUM: 9.1 mg/dL (ref 8.9–10.3)
CO2: 23 mmol/L (ref 22–32)
CREATININE: 1.79 mg/dL — AB (ref 0.61–1.24)
Chloride: 100 mmol/L — ABNORMAL LOW (ref 101–111)
GFR calc non Af Amer: 34 mL/min — ABNORMAL LOW (ref >60)
GFR, EST AFRICAN AMERICAN: 40 mL/min — AB (ref >60)
Glucose, Bld: 225 mg/dL — ABNORMAL HIGH (ref 65–99)
Potassium: 4.8 mmol/L (ref 3.5–5.1)
SODIUM: 134 mmol/L — AB (ref 135–145)

## 2016-08-20 MED ORDER — TICAGRELOR 90 MG PO TABS: 90.0000 mg | ORAL_TABLET | Freq: Two times a day (BID) | ORAL | 10 refills | Status: DC

## 2016-08-20 MED ORDER — FENOFIBRATE 160 MG PO TABS: 160.0000 mg | ORAL_TABLET | Freq: Every day | ORAL | 3 refills | Status: DC

## 2016-08-20 MED ORDER — NITROGLYCERIN 0.4 MG SL SUBL: 0.4000 mg | SUBLINGUAL_TABLET | SUBLINGUAL | 2 refills | Status: DC | PRN

## 2016-08-20 MED ORDER — TICAGRELOR 90 MG PO TABS: 90.0000 mg | ORAL_TABLET | Freq: Two times a day (BID) | ORAL | 0 refills | Status: DC

## 2016-08-20 MED ORDER — ANGIOPLASTY BOOK
Freq: Once | Status: AC
  Administered 2016-08-20: 05:00:00
  Filled 2016-08-20: qty 1

## 2016-08-20 MED ORDER — ATORVASTATIN CALCIUM 80 MG PO TABS
80.0000 mg | ORAL_TABLET | Freq: Every day | ORAL | 1 refills | Status: DC
Start: 2016-08-20 — End: 2017-09-02

## 2016-08-20 NOTE — Progress Notes (Signed)
#   10  S/E EBOMY @ [employer] [facility] # [phone]   BRILINTA 90 MG BID   COVER- YES  CO-PAY- $ 8.35  TIER- 3 DRUG  PRIOR APPROVAL- NO   PHARMACY : [facility], [facility] AND [facility]

## 2016-08-20 NOTE — Telephone Encounter (Signed)
Tcm....  Pt is coming in on 09/04/16 to see [name] He saw Dr End in hospital  Is also scheduled for New coumadin on 08/28/16

## 2016-08-20 NOTE — Discharge Instructions (Addendum)

## 2016-08-20 NOTE — Telephone Encounter (Signed)
Patient currently admitted at this time. 

## 2016-08-20 NOTE — Progress Notes (Signed)
Progress Note  Patient Name: Lucas Caldwell Date of Encounter: 08/20/2016  Primary Cardiologist:  Dr. Andree Elk, [facility]   Subjective   Lucas Caldwell feels better this morning. He denies dyspnea and chest pain.  Inpatient Medications    Scheduled Meds: . allopurinol  300 mg Oral Daily  . aspirin EC  81 mg Oral BH-q7a  . atorvastatin  80 mg Oral q1800  . bosutinib  200 mg Oral Q supper  . famotidine  20 mg Oral QHS  . fenofibrate  160 mg Oral Daily  . heparin  5,000 Units Subcutaneous Q8H  . insulin aspart  0-15 Units Subcutaneous TID WC  . insulin glargine  50 Units Subcutaneous QHS  . levothyroxine  25 mcg Oral QAC breakfast  . magnesium oxide  400 mg Oral BID  . metoprolol succinate  50 mg Oral BH-q7a  . mirtazapine  15 mg Oral QHS  . pantoprazole  40 mg Oral Daily  . sodium chloride flush  3 mL Intravenous Q12H  . ticagrelor  90 mg Oral BID  . Warfarin - Pharmacist Dosing Inpatient   Does not apply q1800   Continuous Infusions: . sodium chloride     PRN Meds: sodium chloride, albuterol, alum & mag hydroxide-simeth, loperamide, nitroGLYCERIN, ondansetron (ZOFRAN) IV, sodium chloride flush   Vital Signs    Vitals:   08/20/16 0000 08/20/16 0200 08/20/16 0400 08/20/16 0800  BP: (!) 150/63 135/86 (!) 151/66 (!) 139/55  Pulse:   76 89  Resp: (!) 21 (!) 24 17 16   Temp: 97.6 F (36.4 C)  98.2 F (36.8 C) 98.4 F (36.9 C)  TempSrc: Oral  Oral Oral  SpO2: 100% 98% 100% 100%  Weight:   198 lb 6.6 oz (90 kg)   Height:        Intake/Output Summary (Last 24 hours) at 08/20/16 0921 Last data filed at 08/20/16 0900  Gross per 24 hour  Intake            592.5 ml  Output              575 ml  Net             17.5 ml   Filed Weights   08/18/16 0535 08/19/16 0344 08/20/16 0400  Weight: 193 lb 14.4 oz (88 kg) 194 lb 3.2 oz (88.1 kg) 198 lb 6.6 oz (90 kg)    Telemetry    Normal sinus rhythm - Personally Reviewed  ECG    Normal sinus rhythm, first AV block,  right bundle branch block. - Personally Reviewed  Physical Exam  Elderly male in no distress. GEN: No acute distress.   Neck: No JVD Cardiac: RRR, no murmurs, rubs, or gallops. Radial access site is unremarkable. Respiratory: Clear to auscultation bilaterally. GI: Soft, nontender, non-distended  MS: No edema; No deformity. Neuro:  Nonfocal  Psych: Normal affect   Labs    Chemistry Recent Labs Lab 08/18/16 0205 08/19/16 0349 08/20/16 0326  NA 135 135 134*  K 4.0 3.8 4.8  CL 100* 104 100*  CO2 23 22 23   GLUCOSE 155* 178* 225*  BUN 26* 23* 23*  CREATININE 1.87* 1.71* 1.79*  CALCIUM 9.2 9.2 9.1  PROT 7.6  --   --   ALBUMIN 3.2*  --   --   AST 25  --   --   ALT 16*  --   --   ALKPHOS 87  --   --   BILITOT 0.6  --   --  GFRNONAA 32* 36* 34*  GFRAA 37* 42* 40*  ANIONGAP 12 9 11      Hematology Recent Labs Lab 08/18/16 0205 08/19/16 0349 08/20/16 0326  WBC 10.9* 10.2 14.0*  RBC 4.00* 4.00* 3.65*  HGB 10.2* 10.3* 9.4*  HCT 32.8* 33.1* 30.1*  MCV 82.0 82.8 82.5  MCH 25.5* 25.8* 25.8*  MCHC 31.1 31.1 31.2  RDW 16.2* 16.2* 16.3*  PLT 193 195 164    Cardiac Enzymes Recent Labs Lab 08/16/16 1920 08/17/16 0448  TROPONINI 0.03* 0.03*   No results for input(s): TROPIPOC in the last 168 hours.   BNPNo results for input(s): BNP, PROBNP in the last 168 hours.   DDimer No results for input(s): DDIMER in the last 168 hours.   Radiology    No results found.  Cardiac Studies   Coronary Diagrams   Diagnostic Diagram       Post-Intervention Diagram          Patient Profile     83 y.o. male with a history of NICM, chronic combined syst/diast CHF, CKD, HTN, HL, DM, CML, IDA, and hypothyroidism, admitted 5/28 with chest pain and dyspnea, and now found to have severe LM and RCAdzs. He was transferred to [facility] for CVTS evaluation. He was turned down as a surgical candidate. He subsequently underwent successful culprit intervention in the proximal right  coronary  The 60%  proximal left main was not treated. Plan is medical therapy unless dyspnea continues to be a limiting symptom.  Assessment & Plan    1. Unstable angina pectoris/coronary artery disease with exertional dyspnea as anginal equivalent. Culprit angioplasty of the right coronary with successful stenting was performed without complications. 2. CK D, stage III, stable after contrast exposure. Creatinine today is 1.79, very similar to that on admission which was 1.83. 3. Chronic combined systolic and diastolic heart failure, without evidence of volume overload 4. Essential hypertension, currently with stable blood pressures. 5. Paroxysmal atrial fibrillation, currently in sinus rhythm  Overall plan is probable discharge later today. He will have one month with triple drug therapy that will include Brilinta, aspirin, and warfarin. Since he has been in sinus rhythm the entirety of this hospital stay, no overlap therapy with Lovenox as needed. If he ambulates well, will discharge today.  Signed, Sinclair Grooms, MD  08/20/2016, 9:21 AM

## 2016-08-20 NOTE — Progress Notes (Signed)
[id number] Education completed with pt and daughter and wife. Pt has been a little confused today. Family stated he has gotten that way before when hospitalized. Pt walked with walker this morning and he does not have one at home. Would recommend walker for home and home PT. Asked RN to get case manager to see for home needs. Family stated he has had PT at home before and it was very beneficial. Stressed importance of brilinta with stent. Wife has brilinta card. Reviewed ex ed, NTG use, CRP 2 and signs/symptoms of CHF. Gave CHF booklet and low sodium diets. Encouraged daily weights and 2000 mg sodium restriction. Discussed CRP 2 and referring to [facility] program. Graylon Good RN BSN 08/20/2016 11:21 AM

## 2016-08-20 NOTE — Care Management Note (Signed)
Case Management Note  Patient Details  Name: Lucas Caldwell MRN: [id number] Date of Birth: April 05, 1933  Subjective/Objective:    From home with spouse, s/p coronary stent intervention, will be on brilinta, NCM gave patient and wife 30 day savings card with co pay amt of 8.60.  NCM checke [employer] and they do have brilinta in stock.  Patient ambulated with cardiac rehab and they are rec HHPT and a rolling walker for patient at discharge.  NCM spoke with wife and daughter and they chose [facility] from [facility] agency list.  Referral made to Butch Penny with [place] and rolling walker, she will bring walker to patient's room. Soc will begin 24-48 hrs post discharge.   PCP  Juanell Fairly                          Action/Plan: HHPT and rolling walker at discharge today with [facility].   Expected Discharge Date:                  Expected Discharge Plan:  [place]  In-House Referral:     Discharge planning Services  CM Consult  Post Acute Care Choice:  Home Health Choice offered to:  Spouse, Adult Children  DME Arranged:  Walker rolling DME Agency:  [place] Arranged:  PT [place] Agency:  [place]  Status of Service:  Completed, signed off  If discussed at [place] of Stay Meetings, dates discussed:    Additional Comments:  Zenon Mayo, RN 08/20/2016, 11:50 AM

## 2016-08-20 NOTE — Discharge Summary (Signed)
The patient has been seen in conjunction with Harlan Stains, NP. All aspects of care have been considered and discussed. The patient has been personally interviewed, examined, and all clinical data has been reviewed.   The patient ambulated with improvement in endurance. He was not short of breath. No chest discomfort.  No evidence of access site bleeding.  Kidney function and hemoglobin is stable.  Plan discharge with follow-up and medical therapy as outlined below.  Discharge Summary    Patient ID: Lucas Caldwell,  MRN: [id number], DOB/AGE: Nov 04, 1933 83 y.o.  Admit date: 08/15/2016 Discharge date: 08/20/2016  Primary Care Provider: Juanell Fairly Primary Cardiologist: End   Discharge Diagnoses    Active Problems:   CAD in native artery   Acute renal failure (ARF) (HCC)   Acute systolic congestive heart failure (HCC)   Unstable angina (HCC)   Hypertension   CML (chronic myelocytic leukemia) (HCC)   Allergies Allergies  Allergen Reactions  . Clopidogrel Anaphylaxis    Other reaction(s): ANAPHYLAXIS;altered mental status;  (electrolytes "out of wack" and confusion per family; THEY DO NOT REMEMBER ANY OTHER REACTION)- MSB 10/10/15  . Ciprofloxacin Itching  . Lyrica [Pregabalin] Itching  . Spironolactone     Other reaction(s): Other (See Comments) gynecomastia  . Plavix [Clopidogrel Bisulfate] Anxiety    Diagnostic Studies/Procedures    LHC: 08/14/16  Conclusion   Conclusions: 1. Significant multivessel coronary artery disease, including 70% ostial LMCA stenosis (with catheter dampening) and 95% proximal/mid RCA lesion. 2. Patent stents in the mid LCx and mid RCA. 3. Upper normal left ventricular filling pressure.  Recommendations: 5. Given LMCA and RCA disease in the setting of diabetes mellitus, surgical consultation for CABG should be considered. However, if comorbidities preclude surgical intervention, PCI to unprotected LMCA and mid RCA could be  considered. 6. Medical therapy, including gentle hydration, given chronic kidney disease. 7. Aggressive secondary prevention.  [name], MD [facility]   LHC: 08/19/16  Conclusion   Conclusions: 4. Moderate to severe proximal/mid RCA disease with focal 95% stenosis at proximal margin of previously placed mid-RCA stent. 5. Mild in-stent restenosis of mid RCA stent. 6. Successful PCI to proximal/mid RCA with placement of a Synergy 3.0 x 28 mm drug-eluting stent with 0% residual stenosis and TIMI-3 flow.  Recommendations: 8. Restart warfarin per pharmacy. Anticipate triple therapy with ASA, ticagrelor, and warfarin x 1 month, following by warfarin and ticagrelor. 9. Aggressive secondary prevention. 10. Medical management of non-critical LMCA stenosis. If chest pain and shortness of breath persist, high-risk PCI to the ostial LMCA will need to be considered.  [name], MD   Coronary Diagrams   Diagnostic Diagram       Post-Intervention Diagram         _____________   History of Present Illness     83 y/o ?with a h/o NICM dating back to the late 1990's, CAD (dx 2014 2 stents placed), HTN, HL, DM II, PAF (chronic coumadin), CKD III, CML, IDA, BPH, and hypothyroidism. [name] followed closely @ [facility] cardiology. EF previously depressed but normalized by 2012. Last echo in 06/2015 showed EF of 50-55%. Last seen by Dr. Andree Elk in March, @ which time he was doing well. He said that beginning in early April, he began to note increasing DOE. This had progressed to the point, that he didn't think he can walk 50 yds w/o dyspnea. Over the same period of time, he has noted increased frequency of exertional sscp/pressure that resolves with  rest. This is sometimes/often associated with increased HR's (h/o PAF). Symptoms typically resolve with rest.   On Saturday 5/26, he noted more frequent palpitations associated with chest pressure. This was on and off throughout  the day. He slept poorly Saturday night due to chest pressure and dyspnea. On Sunday morning, he went on to church and felt well, but after returning home, he had recurrent chest pressure. This was on and off throughout the day Sunday and so on Monday morning, he decided to come into the [facility] ED. There, ECG was non-acute (chronic RBBB). Troponins were nl. CXR showed mild vascular congestion. He received one dose of IV lasix and was admitted for further eval. Creat was up slightly on admission.  It was felt that he would require diagnostic cath.  Home doses of enalapril and eplerenone were held.  He underwent diagnostic cath on 5/30 revealing severe ostial LM and prox/mid RCA dzs.  It was felt that he would be best served with CT surgery eval.  Though he has been followed @ [facility], he wished to be transferred to [facility] for [facility]     Consultants: CVTS  He was seen by Dr. Prescott Gum, but felt not to be a surgical candidate due to his comorbidities. This was discussed with Dr. Saunders Revel and planned for PCI to the RCA. He was hydrated over the weekend, and remained on IV heparin. Underwent successful PCI with DES x1 to [facility] with good result. LDL 15, Trig 338. Fenofibrate was added to this admission, and statin was changed to Lipitor. Planned for medical therapy for LM lesion. Cr post cath was 1.78 which was close to baseline function on admission. Radial cath site was stable without hematoma. He was able to work with cardiac rehab, post cath. Recommended a rolling walker and HH PT follow up at home prior to discharge. His home coumadin was held during this admission, and restarted post cath on 08/19/16. He was resumed on home coumadin dose 4mg  daily, along with DAPT with ASA and Brilinta. Will plan for one month of triple therapy, then dc ASA at that time. He maintained SR throughout admission. Plan to allow for INR to slow drift to goal given DAPT, and no Afib this admission.   He was seen  by Dr. Tamala Julian and determined stable for discharge home. Follow up in the office in [place] has been arranged. Also follow up INR appt arranged. Home Health PT ordered, along with rolling walker. Medications are noted below.   _____________  Discharge Vitals Blood pressure (!) 139/55, pulse 89, temperature 98.4 F (36.9 C), temperature source Oral, resp. rate 16, height 5\' 6"  (1.676 m), weight 198 lb 6.6 oz (90 kg), SpO2 100 %.  Filed Weights   08/18/16 0535 08/19/16 0344 08/20/16 0400  Weight: 193 lb 14.4 oz (88 kg) 194 lb 3.2 oz (88.1 kg) 198 lb 6.6 oz (90 kg)    Labs & Radiologic Studies    CBC  Recent Labs  08/19/16 0349 08/20/16 0326  WBC 10.2 14.0*  HGB 10.3* 9.4*  HCT 33.1* 30.1*  MCV 82.8 82.5  PLT 195 161   Basic Metabolic Panel  Recent Labs  08/19/16 0349 08/20/16 0326  NA 135 134*  K 3.8 4.8  CL 104 100*  CO2 22 23  GLUCOSE 178* 225*  BUN 23* 23*  CREATININE 1.71* 1.79*  CALCIUM 9.2 9.1   Liver Function Tests  Recent Labs  08/18/16 0205  AST 25  ALT 16*  ALKPHOS 87  BILITOT 0.6  PROT 7.6  ALBUMIN 3.2*   No results for input(s): LIPASE, AMYLASE in the last 72 hours. Cardiac Enzymes No results for input(s): CKTOTAL, CKMB, CKMBINDEX, TROPONINI in the last 72 hours. BNP Invalid input(s): POCBNP D-Dimer No results for input(s): DDIMER in the last 72 hours. Hemoglobin A1C No results for input(s): HGBA1C in the last 72 hours. Fasting Lipid Panel No results for input(s): CHOL, HDL, LDLCALC, TRIG, CHOLHDL, LDLDIRECT in the last 72 hours. Thyroid Function Tests No results for input(s): TSH, T4TOTAL, T3FREE, THYROIDAB in the last 72 hours.  Invalid input(s): FREET3 _____________  Dg Chest 2 View  Result Date: 08/12/2016 CLINICAL DATA:  Chest pain and shortness of Breath EXAM: CHEST  2 VIEW COMPARISON:  06/20/2016 FINDINGS: Cardiac shadow is stable. Mild vascular congestion is seen. No focal infiltrate or sizable effusion is noted. No bony  abnormality is seen. IMPRESSION: Mild vascular congestion. Electronically Signed   By: Inez Catalina M.D.   On: 08/12/2016 08:58   Dg Chest Port 1 View  Result Date: 08/16/2016 CLINICAL DATA:  Shortness of breath EXAM: PORTABLE CHEST 1 VIEW COMPARISON:  08/12/2016 FINDINGS: Chronic cardiomegaly and hyperinflation. Chronic mild interstitial coarsening. There is no edema, consolidation, effusion, or pneumothorax. Stable aortic and hilar contours. No acute osseous finding. IMPRESSION: 1. No acute finding. 2. Chronic cardiomegaly and hyperinflation. Electronically Signed   By: Monte Fantasia M.D.   On: 08/16/2016 19:16   Disposition   Pt is being discharged home today in good condition.  Follow-up [facility] Follow up.   Why:  rolling walker, will be brought to patient's room Contact information: [address] [place] [address] [phone]        [facility] Follow up.   Why:  HHPT Contact information: [place] [address] [phone]        Rogelia Mire, NP Follow up on 09/04/2016.   Specialties:  Nurse Practitioner, Cardiology, Radiology Why:  at 2:30pm for your follow up appt.  Contact information: [address] [place] [address] [phone]        [facility] Follow up on 08/28/2016.   Specialty:  Cardiology Why:  at 2:20pm for your INR check.  Contact information: [address] [place] [id number] [address] [place] [phone]         Discharge Instructions    (HEART FAILURE PATIENTS) Call MD:  Anytime you have any of the following symptoms: 1) 3 pound weight gain in 24 hours or 5 pounds in 1 week 2) shortness of breath, with or without a dry hacking cough 3) swelling in the hands, feet or stomach 4) if you have to sleep on extra pillows at night in order to  breathe.    Complete by:  As directed    AMB Referral to Cardiac Rehabilitation - Phase II    Complete by:  As directed    Diagnosis:  Coronary Stents   Amb Referral to Cardiac Rehabilitation    Complete by:  As directed    Diagnosis:  Coronary Stents   Call MD for:  redness, tenderness, or signs of infection (pain, swelling, redness, odor or green/yellow discharge around incision site)    Complete by:  As directed    Diet - low sodium heart healthy    Complete by:  As directed    Discharge instructions    Complete by:  As directed    Radial Site Care Refer to this sheet in the next few weeks. These instructions provide you with information on caring for yourself after your procedure. Your caregiver may also give you more specific instructions. Your treatment has been planned according to current medical practices, but problems sometimes occur. Call your caregiver if you have any problems or questions after your procedure. HOME CARE INSTRUCTIONS You may shower the day after the procedure.Remove the bandage (dressing) and gently wash the site with plain soap and water.Gently pat the site dry.  Do not apply powder or lotion to the site.  Do not submerge the affected site in water for 3 to 5 days.  Inspect the site at least twice daily.  Do not flex or bend the affected arm for 24 hours.  No lifting over 5 pounds (2.3 kg) for 5 days after your procedure.  Do not drive home if you are discharged the same day of the procedure. Have someone else drive you.  You may drive 24 hours after the procedure unless otherwise instructed by your caregiver.  What to expect: Any bruising will usually fade within 1 to 2 weeks.  Blood that collects in the tissue (hematoma) may be painful to the touch. It should usually decrease in size and tenderness within 1 to 2 weeks.  SEEK IMMEDIATE MEDICAL CARE IF: You have unusual pain at the radial site.  You have redness, warmth, swelling, or pain at the radial  site.  You have drainage (other than a small amount of blood on the dressing).  You have chills.  You have a fever or persistent symptoms for more than 72 hours.  You have a fever and your symptoms suddenly get worse.  Your arm becomes pale, cool, tingly, or numb.  You have heavy bleeding from the site. Hold pressure on the site.   You will be on aspirin, Brilinta, and coumadin for one month. Then after one month, will plan to stop the aspirin and continue Brilinta and coumadin. It is very important that you do not miss any doses of your Brilinta as your stent may clot. Follow up in the [place] office has been arranged. You will need to have your INR checked next week on 6/13. Please call the office if you have any questions.   Increase activity slowly    Complete by:  As directed       Discharge Medications   Current Discharge Medication List    START taking these medications   Details  atorvastatin (LIPITOR) 80 MG tablet Take 1 tablet (80 mg total) by mouth daily at 6 PM. Qty: 90 tablet, Refills: 1    fenofibrate 160 MG tablet Take 1 tablet (160 mg total) by mouth daily. Qty: 30 tablet, Refills: 3    nitroGLYCERIN (NITROSTAT) 0.4 MG SL tablet Place 1 tablet (0.4 mg total) under the tongue every 5 (five) minutes x 3 doses as needed for chest pain. Qty: 25 tablet, Refills: 2    ticagrelor (BRILINTA) 90 MG TABS tablet Take 1 tablet (90 mg total) by mouth 2 (two) times daily. Qty: 60 tablet, Refills: 10      CONTINUE these medications which have NOT CHANGED   Details  warfarin (COUMADIN) 2 MG tablet Take 4 mg by mouth at bedtime.    acetaminophen (TYLENOL) 500 MG tablet Take 500 mg by mouth every 6 (six) hours as needed.  albuterol (PROVENTIL HFA;VENTOLIN HFA) 108 (90 BASE) MCG/ACT inhaler Inhale 2 puffs into the lungs every 6 (six) hours as needed for wheezing or shortness of breath. Qty: 1 Inhaler, Refills: 2    allopurinol (ZYLOPRIM) 300 MG tablet Take 300 mg by mouth  daily.  Refills: 12    aspirin EC 81 MG tablet Take 81 mg by mouth every morning.    bosutinib (BOSULIF) 100 MG tablet Take 200 mg by mouth daily with supper. Take with food.    digoxin (LANOXIN) 0.125 MG tablet Take 125 mcg by mouth daily.  Refills: 12    famotidine (PEPCID) 20 MG tablet Take 20 mg by mouth at bedtime.    hydrocortisone 2.5 % ointment Apply topically 3 (three) times daily. Qty: 30 g, Refills: 0    LANTUS SOLOSTAR 100 UNIT/ML Solostar Pen Inject 20 Units into the skin at bedtime. Qty: 15 mL, Refills: 0    levothyroxine (SYNTHROID, LEVOTHROID) 25 MCG tablet Take 25 mcg by mouth daily before breakfast.  Refills: 12    loperamide (IMODIUM A-D) 2 MG tablet Take 2 mg by mouth 4 (four) times daily as needed for diarrhea or loose stools.    magnesium oxide (MAG-OX) 400 MG tablet Take 400 mg by mouth 2 (two) times daily.    metoprolol succinate (TOPROL-XL) 50 MG 24 hr tablet Take 50 mg by mouth every morning. Refills: 12    mirtazapine (REMERON) 15 MG tablet Take 15 mg by mouth at bedtime. Refills: 3    Multiple Vitamins-Minerals (MULTIVITAMIN ADULTS 50+ PO) Take 1 tablet by mouth every morning.    nepafenac (NEVANAC) 0.1 % ophthalmic suspension Place 1 drop into both eyes 3 (three) times daily as needed.    pregabalin (LYRICA) 50 MG capsule Take 50 mg by mouth 3 (three) times daily.    Spacer/Aero-Holding Chambers (OPTICHAMBER ADVANTAGE) MISC 1 each by Other route once. Qty: 1 each, Refills: 0      STOP taking these medications     heparin 100-0.45 UNIT/ML-% infusion      omeprazole (PRILOSEC) 40 MG capsule      simvastatin (ZOCOR) 20 MG tablet          Aspirin prescribed at discharge?  Yes High Intensity Statin Prescribed? (Lipitor 40-80mg  or Crestor 20-40mg ): Yes Beta Blocker Prescribed? Yes For EF <40%, was ACEI/ARB Prescribed? No: EF ok, CKD ADP Receptor Inhibitor Prescribed? (i.e. Plavix etc.-Includes Medically Managed Patients): Yes For EF  <40%, Aldosterone Inhibitor Prescribed? No: EF ok Was EF assessed during THIS hospitalization? Yes Was Cardiac Rehab II ordered? (Included Medically managed Patients): Yes   Outstanding Labs/Studies   FLp/LFTs in 6 weeks, INR 6/13  Duration of Discharge Encounter   Greater than 30 minutes including physician time.  Signed, Reino Bellis NP-C 08/20/2016, 2:38 PM

## 2016-08-20 NOTE — Progress Notes (Signed)
CARDIAC REHAB PHASE I   PRE:  Rate/Rhythm: 89 SR  BP:  Supine:   Sitting: 136/55  Standing:    SaO2: 100%RA  MODE:  Ambulation: 300 ft   POST:  Rate/Rhythm: 107 ST  BP:  Supine:   Sitting: 136/81  Standing:    SaO2: 100%RA 0825-0900 Pt walked 300 ft on RA with rolling walker and minimal asst. Stated his breathing was better. Pt thinks he is at [facility]. Re oriented him. To recliner with chair alarm. No c/o CP. Will wait until family here for education. Pt did not seem confused last two times that I walked him but is today.   Graylon Good, RN BSN  08/20/2016 8:58 AM

## 2016-08-21 NOTE — Telephone Encounter (Signed)
No answer/Voicemail box has not been set up.  

## 2016-08-21 NOTE — Telephone Encounter (Signed)
Patient contacted regarding discharge from [facility] on 08/20/16.  Patient understands to follow up with provider [name] NP on 09/04/16 at 2:30 PM at [facility]. Patient understands discharge instructions? Yes Patient understands medications and regiment? No, reviewed medications at length. Patient understands to bring all medications to this visit? Yes  Spoke with patients wife [name] and she wanted to know if he should take his furosemide or enalapril. Reviewed that those were discontinued and current medications that he should be taking. Instructed her to bring all medications to his appointment and we would be happy to review each of them with her. She was appreciative for the call and verified appointment information. Patient also has coumadin visit scheduled for 08/28/16 at 2:20PM. Provided her with our number to call back if she should have any further questions. She verbalized understanding of our conversation, agreement with plan, and had no further questions at this time.

## 2016-08-28 ENCOUNTER — Ambulatory Visit (INDEPENDENT_AMBULATORY_CARE_PROVIDER_SITE_OTHER): Payer: Medicare HMO

## 2016-08-28 DIAGNOSIS — I480 Paroxysmal atrial fibrillation: Secondary | ICD-10-CM | POA: Insufficient documentation

## 2016-08-28 DIAGNOSIS — Z7901 Long term (current) use of anticoagulants: Secondary | ICD-10-CM

## 2016-08-28 LAB — POCT INR: INR: 1.8

## 2016-09-04 ENCOUNTER — Ambulatory Visit (INDEPENDENT_AMBULATORY_CARE_PROVIDER_SITE_OTHER): Payer: Medicare HMO | Admitting: *Deleted

## 2016-09-04 ENCOUNTER — Encounter: Payer: Self-pay | Admitting: Nurse Practitioner

## 2016-09-04 ENCOUNTER — Ambulatory Visit (INDEPENDENT_AMBULATORY_CARE_PROVIDER_SITE_OTHER): Payer: Medicare HMO | Admitting: Nurse Practitioner

## 2016-09-04 VITALS — BP 120/54 | HR 67 | Ht 63.0 in | Wt 201.0 lb

## 2016-09-04 DIAGNOSIS — I255 Ischemic cardiomyopathy: Secondary | ICD-10-CM

## 2016-09-04 DIAGNOSIS — N183 Chronic kidney disease, stage 3 unspecified: Secondary | ICD-10-CM

## 2016-09-04 DIAGNOSIS — Z7901 Long term (current) use of anticoagulants: Secondary | ICD-10-CM

## 2016-09-04 DIAGNOSIS — I5022 Chronic systolic (congestive) heart failure: Secondary | ICD-10-CM | POA: Diagnosis not present

## 2016-09-04 DIAGNOSIS — I480 Paroxysmal atrial fibrillation: Secondary | ICD-10-CM | POA: Diagnosis not present

## 2016-09-04 DIAGNOSIS — I2510 Atherosclerotic heart disease of native coronary artery without angina pectoris: Secondary | ICD-10-CM | POA: Diagnosis not present

## 2016-09-04 DIAGNOSIS — I10 Essential (primary) hypertension: Secondary | ICD-10-CM | POA: Diagnosis not present

## 2016-09-04 DIAGNOSIS — E785 Hyperlipidemia, unspecified: Secondary | ICD-10-CM | POA: Diagnosis not present

## 2016-09-04 LAB — POCT INR: INR: 4

## 2016-09-04 NOTE — Progress Notes (Signed)
Office Visit    Patient Name: Lucas Caldwell Date of Encounter: 09/04/2016  Primary Care Provider:  Juanell Fairly, MD Primary Cardiologist:  Andree Coss, MD (formerly K. Adams @ [employer])  Chief Complaint    83 year old male with prior history of coronary artery disease, mixed ischemic and nonischemic cardio myopathy, hypertension, hyperlipidemia, type 2 diabetes mellitus, paroxysmal atrial fibrillation on chronic Coumadin, stage III chronic kidney disease, CML, iron deficiency anemia, BPH, and hypothyroidism, who presents for hospital follow-up after recent right coronary artery stenting.  Past Medical History    Past Medical History:  Diagnosis Date  . Arthritis    "probably in his legs" (08/15/2016)  . Asthma   . Blind right eye   . BPH (benign prostatic hyperplasia)   . Breast asymmetry    Left breast is larger, present for several years.  [name] CAD (coronary artery disease)    a. Cath in the late 90's - reportedly ok;  b. 2014 s/p stenting x 2 @ [facility]; c 08/19/16 Cath/PCI with DES -> RCA, plan to treat LM 70% medically. Seen by surgery and felt to be too high risk for CABG.  . Chronic combined systolic (congestive) and diastolic (congestive) heart failure ([place])    a. Previously reduced EF-->50% by echo in 2012;  b. 06/2015 Echo: EF 50-55%  c. 07/2016 Echo: EF 45-50%.  . CKD (chronic kidney disease), stage III   . CML (chronic myelocytic leukemia) ([place])   . GERD (gastroesophageal reflux disease)   . Gout   . Hyperlipemia   . Hypertension   . Hypothyroidism   . Iron deficiency anemia   . Migraine    "in the 1960s" (08/15/2016)  . NICM (nonischemic cardiomyopathy) ([place])    a. Previously reduced EF-->50% by echo in 2012;  b. 06/2015 Echo: EF 50-55%, Gr2 DD, mild MR, mildly dil LA, Ao sclerosis, mild TR;  c. 07/2016 Echo: EF 45-50%, ? inf HK, mild MR, mildly dil LA.  [name] PAF (paroxysmal atrial fibrillation) (HCC)    a. ? Dx 2014-->s/p DCCV;  b. CHA2DS2VASc = 6-->chronic coumadin.  [name] RBBB   .  Type II diabetes mellitus ([place])    Past Surgical History:  Procedure Laterality Date  . CATARACT EXTRACTION W/ INTRAOCULAR LENS  IMPLANT, BILATERAL Bilateral   . CORONARY ANGIOPLASTY WITH STENT PLACEMENT  09/2012   2 stents  . CORONARY STENT INTERVENTION N/A 08/19/2016   Procedure: Coronary Stent Intervention;  Surgeon: [name], MD;  Location: [place] [facility];  Service: Cardiovascular;  Laterality: N/A;  . HERNIA REPAIR     "navel"  . LEFT HEART CATH AND CORONARY ANGIOGRAPHY N/A 08/14/2016   Procedure: Left Heart Cath and Coronary Angiography;  Surgeon: [name], MD;  Location: [place] [facility];  Service: Cardiovascular;  Laterality: N/A;  . TOE AMPUTATION Right    "big toe"    Allergies  Allergies  Allergen Reactions  . Clopidogrel Anaphylaxis    Other reaction(s): ANAPHYLAXIS;altered mental status;  (electrolytes "out of wack" and confusion per family; THEY DO NOT REMEMBER ANY OTHER REACTION)- MSB 10/10/15  . Ciprofloxacin Itching  . Lyrica [Pregabalin] Itching  . Spironolactone     Other reaction(s): Other (See Comments) gynecomastia  . Plavix [Clopidogrel Bisulfate] Anxiety    History of Present Illness    83 year old male with the above complex past medical history. He has a history of nonischemic cardio myopathy dating back to the late 1990s. He was later diagnosed with CAD and underwent stenting  2 at [facility] in 2014. Other history includes hypertension, hyperlipidemia, type 2 diabetes mellitus, paroxysmal atrial fibrillation on chronic Coumadin, stage III chronic kidney disease, CML, iron diffuse anemia, BPH, obesity, and hypothyroidism. He appears to be followed closely at [facility]. Despite prior history of cardiomyopathy, he did have normalization of LV function in 2012 with an echo in April 2017 also showing an EF of 50-55%. Unfortunately, over the a 2 to three-month period earlier this year, he began expressing progressive dyspnea on exertion sometimes  associated with elevated heart rates. He also experienced progressive exertional chest discomfort. Due to progressive symptoms, he presented to [facility] on May 28. Troponins were normal. Echo showed an EF of 45-50%. Due to progressive symptoms, once creatinine stabilized, we performed catheterization which showed moderate to severe left main disease of approximate 70% with severe right coronary artery disease. He was transferred to [facility] for surgical evaluation. He was not felt to be a strong candidate for bypass surgery given multiple comorbidities. As a result, he underwent stenting of the right coronary artery.  medical therapy was recommended for the left main although it could be considered for stenting if he had worsening symptoms.  Since discharge from the hospital, he has not done very much. [name] working with physical therapy. He does have dyspnea on exertion and he can't be sure that it's improved any since discharge. He has not been having any chest pain. He denies PND, orthopnea, dizziness, syncope, edema, or early satiety. He has been weighing himself daily and weight has been stable.  Home Medications    Prior to Admission medications   Medication Sig Start Date End Date Taking? Authorizing Provider  acetaminophen (TYLENOL) 500 MG tablet Take 500 mg by mouth every 6 (six) hours as needed.   Yes [provider]  albuterol (PROVENTIL HFA;VENTOLIN HFA) 108 (90 BASE) MCG/ACT inhaler Inhale 2 puffs into the lungs every 6 (six) hours as needed for wheezing or shortness of breath. 11/04/14  Yes Ahmed Prima, MD  allopurinol (ZYLOPRIM) 300 MG tablet Take 300 mg by mouth daily.  11/10/15  Yes [provider]  aspirin EC 81 MG tablet Take 81 mg by mouth every morning. 03/05/12  Yes [provider]  atorvastatin (LIPITOR) 80 MG tablet Take 1 tablet (80 mg total) by mouth daily at 6 PM. 08/20/16  Yes Reino Bellis B, NP  bosutinib (BOSULIF) 100 MG tablet  Take 200 mg by mouth daily with supper. Take with food.   Yes [provider]  digoxin (LANOXIN) 0.125 MG tablet Take 125 mcg by mouth daily.  11/10/15  Yes [provider]  famotidine (PEPCID) 20 MG tablet Take 20 mg by mouth at bedtime.   Yes [provider]  fenofibrate 160 MG tablet Take 1 tablet (160 mg total) by mouth daily. 08/21/16  Yes Reino Bellis B, NP  hydrocortisone 2.5 % ointment Apply topically 3 (three) times daily. 07/20/16  Yes Triplett, Cari B, FNP  LANTUS SOLOSTAR 100 UNIT/ML Solostar Pen Inject 20 Units into the skin at bedtime. Patient taking differently: Inject 50 Units into the skin at bedtime.  12/11/15  Yes Demetrios Loll, MD  levothyroxine (SYNTHROID, LEVOTHROID) 25 MCG tablet Take 25 mcg by mouth daily before breakfast.  10/27/15  Yes [provider]  loperamide (IMODIUM A-D) 2 MG tablet Take 2 mg by mouth 3 (three) times daily as needed for diarrhea or loose stools.    Yes [provider]  magnesium oxide (MAG-OX) 400  MG tablet Take 400 mg by mouth 2 (two) times daily.   Yes [provider]  metoprolol succinate (TOPROL-XL) 50 MG 24 hr tablet Take 50 mg by mouth every morning. 12/04/15  Yes [provider]  mirtazapine (REMERON) 15 MG tablet Take 15 mg by mouth at bedtime. 11/02/15  Yes [provider]  Multiple Vitamins-Minerals (MULTIVITAMIN ADULTS 50+ PO) Take 1 tablet by mouth every morning.   Yes [provider]  nepafenac (NEVANAC) 0.1 % ophthalmic suspension Place 1 drop into both eyes 3 (three) times daily as needed.   Yes [provider]  nitroGLYCERIN (NITROSTAT) 0.4 MG SL tablet Place 1 tablet (0.4 mg total) under the tongue every 5 (five) minutes x 3 doses as needed for chest pain. 08/20/16  Yes Reino Bellis B, NP  pregabalin (LYRICA) 50 MG capsule Take 50 mg by mouth 3 (three) times daily.   Yes [provider]  Spacer/Aero-Holding Chambers [facility] ADVANTAGE) MISC  1 each by Other route once. 11/04/14  Yes Ahmed Prima, MD  ticagrelor (BRILINTA) 90 MG TABS tablet Take 1 tablet (90 mg total) by mouth 2 (two) times daily. 08/20/16  Yes Reino Bellis B, NP  warfarin (COUMADIN) 2 MG tablet Take 4 mg by mouth at bedtime.   Yes [provider]    Review of Systems    As above, he continues to have dyspnea on exertion. He denies chest pain, palpitations, PND, orthopnea, dizziness, syncope, edema, or early satiety.  All other systems reviewed and are otherwise negative except as noted above.  Physical Exam    VS:  BP (!) 120/54 (BP Location: Left Arm, Patient Position: Sitting, Cuff Size: Large)   Pulse 67   Ht 5\' 3"  (1.6 m)   Wt 201 lb (91.2 kg)   BMI 35.61 kg/m  , BMI Body mass index is 35.61 kg/m. GEN: Well nourished, well developed, in no acute distress.  HEENT: normal.  Neck: Supple, no JVD, carotid bruits, or masses. Cardiac: RRR, no murmurs, rubs, or gallops. No clubbing, cyanosis, edema.  Radials/DP/PT 2+ and equal bilaterally.  Respiratory:  Respirations regular and unlabored, clear to auscultation bilaterally. right wrist catheterization site without bleeding, bruit, or hematoma.  GI: Soft, nontender, nondistended, BS + x 4. MS: no deformity or atrophy. Skin: warm and dry, no rash. Neuro:  Strength and sensation are intact. Psych: Normal affect.  Accessory Clinical Findings    ECG - regular sinus rhythm, 67, left axis deviation, left anterior fascicular block, right bundle branch block, no acute changes.  Lab Results  Component Value Date   CREATININE 1.79 (H) 08/20/2016   BUN 23 (H) 08/20/2016   NA 134 (L) 08/20/2016   K 4.8 08/20/2016   CL 100 (L) 08/20/2016   CO2 23 08/20/2016    Lab Results  Component Value Date   INR 4.0 09/04/2016   INR 1.8 08/28/2016   INR 1.17 08/20/2016     Assessment & Plan    1.  Coronary artery disease: Status post recent PCI drug loading stent placement to the right coronary  artery. Patient has residual moderate severe left main disease of approximately 70%. Recommendation was made for medical management at least initially. He has not had any chest pain. He continues have dyspnea on exertion which might be an anginal equivalent. He knows that he has not been particularly active. [name] currently working with physical therapy. We'll long discussion today about future therapy. He had multiple family members present as well.  I have recommended that he continue to work with physical therapy and then begin cardiac rehabilitation here at [facility]. Referral has been made. He remains on aspirin, Brilinta statin, beta blocker therapy. He will continue aspirin for 1 month with a plan to discontinue this after July 4 at which point, he will remain on just Brilinta and warfarin. Plan to follow-up in one month. If he continues to have significant lifestyle limiting dyspnea on exertion, and blood pressure allows, we can consider adding long-acting nitrate therapy. Finally, consideration could also be given to left main stenting provided that renal function is stable.   2.  Essential hypertension: Blood pressure is stable on current therapy.  3. Paroxysmal atrial fibrillation he is in sinus rhythm today. No recent palpitations. He remains on beta blocker, digoxin, and warfarin therapy. He has been on digoxin for quite some time. I do not see any recent levels checked in the [facility] system. Plan to follow-up level at follow-up appointment. [name] now having his INR followed up in our clinic. He was supratherapeutic today and this will be adjusted by our Coumadin clinic.  4. Hyperlipidemia: LDL was 15 on June 1. [name] on statin therapy.  5. Chronic combined systolic and diastolic congestive heart failure/mixed ischemic and nonischemic cardio myopathy: EF 45-50% by recent echo. Weight has been stable at home. [name] euvolemic on exam. He remains on beta blocker therapy. He had previously been  on an ACE inhibitor and eplerenone however these were discontinued during recent hospitalization secondary to worsening renal function. Creatinine was 1.79 at discharge. Plan to follow-up with basic metabolic panel today.  6. Stage III chronic kidney disease: Creatinine rose during hospitalization in the setting of contrast. This resulted in discontinuation of eplerenone and ACE inhibitor therapy. Follow-up basic metabolic panel today.  7. Disposition: Follow-up in clinic in one month. He will need a digoxin level at that time.   [name], NP 09/04/2016, 5:07 PM

## 2016-09-04 NOTE — Patient Instructions (Signed)
Medication Instructions:  Please continue your current medications ON 7/5, STOP YOUR ASPIRIN  Labwork: Liver, lipid in 6 weeks These are fasting labs -please do not eat or drink after midnight - except water  Testing/Procedures: None  Follow-Up: 6-8 weeks with Dr. Saunders Revel  If you need a refill on your cardiac medications before your next appointment, please call your pharmacy.

## 2016-09-11 ENCOUNTER — Ambulatory Visit (INDEPENDENT_AMBULATORY_CARE_PROVIDER_SITE_OTHER): Payer: Medicare HMO

## 2016-09-11 DIAGNOSIS — I480 Paroxysmal atrial fibrillation: Secondary | ICD-10-CM

## 2016-09-11 DIAGNOSIS — Z7901 Long term (current) use of anticoagulants: Secondary | ICD-10-CM | POA: Diagnosis not present

## 2016-09-11 LAB — POCT INR: INR: 3.8

## 2016-09-17 MED FILL — BOSULIF/100MG/TABS: BOSULIF/100MG/TABS | 30 days supply | Qty: 60 | Fill #6

## 2016-09-19 ENCOUNTER — Encounter: Payer: Self-pay | Admitting: *Deleted

## 2016-09-19 ENCOUNTER — Observation Stay
Admission: EM | Admit: 2016-09-19 | Discharge: 2016-09-20 | Disposition: A | Discharge status: Home / Self Care | Payer: Medicare HMO | Attending: Specialist | Admitting: Specialist

## 2016-09-19 ENCOUNTER — Encounter: Payer: Medicare HMO | Attending: Internal Medicine | Admitting: *Deleted

## 2016-09-19 ENCOUNTER — Other Ambulatory Visit: Payer: Self-pay

## 2016-09-19 ENCOUNTER — Telehealth: Payer: Self-pay | Admitting: Nurse Practitioner

## 2016-09-19 ENCOUNTER — Emergency Department: Payer: Medicare HMO

## 2016-09-19 DIAGNOSIS — C9211 Chronic myeloid leukemia, BCR/ABL-positive, in remission: Secondary | ICD-10-CM | POA: Diagnosis present

## 2016-09-19 DIAGNOSIS — N183 Chronic kidney disease, stage 3 (moderate): Secondary | ICD-10-CM | POA: Diagnosis not present

## 2016-09-19 DIAGNOSIS — E039 Hypothyroidism, unspecified: Secondary | ICD-10-CM | POA: Diagnosis not present

## 2016-09-19 DIAGNOSIS — E1142 Type 2 diabetes mellitus with diabetic polyneuropathy: Secondary | ICD-10-CM | POA: Insufficient documentation

## 2016-09-19 DIAGNOSIS — N179 Acute kidney failure, unspecified: Secondary | ICD-10-CM | POA: Diagnosis not present

## 2016-09-19 DIAGNOSIS — I4510 Unspecified right bundle-branch block: Secondary | ICD-10-CM | POA: Insufficient documentation

## 2016-09-19 DIAGNOSIS — K219 Gastro-esophageal reflux disease without esophagitis: Secondary | ICD-10-CM | POA: Diagnosis not present

## 2016-09-19 DIAGNOSIS — M109 Gout, unspecified: Secondary | ICD-10-CM | POA: Diagnosis not present

## 2016-09-19 DIAGNOSIS — Z794 Long term (current) use of insulin: Secondary | ICD-10-CM | POA: Diagnosis not present

## 2016-09-19 DIAGNOSIS — I200 Unstable angina: Secondary | ICD-10-CM | POA: Diagnosis present

## 2016-09-19 DIAGNOSIS — N400 Enlarged prostate without lower urinary tract symptoms: Secondary | ICD-10-CM | POA: Insufficient documentation

## 2016-09-19 DIAGNOSIS — E1122 Type 2 diabetes mellitus with diabetic chronic kidney disease: Secondary | ICD-10-CM | POA: Diagnosis not present

## 2016-09-19 DIAGNOSIS — I480 Paroxysmal atrial fibrillation: Secondary | ICD-10-CM | POA: Diagnosis not present

## 2016-09-19 DIAGNOSIS — Z79899 Other long term (current) drug therapy: Secondary | ICD-10-CM | POA: Diagnosis not present

## 2016-09-19 DIAGNOSIS — I25118 Atherosclerotic heart disease of native coronary artery with other forms of angina pectoris: Secondary | ICD-10-CM | POA: Diagnosis present

## 2016-09-19 DIAGNOSIS — Z9861 Coronary angioplasty status: Secondary | ICD-10-CM

## 2016-09-19 DIAGNOSIS — E785 Hyperlipidemia, unspecified: Secondary | ICD-10-CM | POA: Diagnosis not present

## 2016-09-19 DIAGNOSIS — I5042 Chronic combined systolic (congestive) and diastolic (congestive) heart failure: Secondary | ICD-10-CM | POA: Insufficient documentation

## 2016-09-19 DIAGNOSIS — Z955 Presence of coronary angioplasty implant and graft: Secondary | ICD-10-CM | POA: Diagnosis not present

## 2016-09-19 DIAGNOSIS — J45909 Unspecified asthma, uncomplicated: Secondary | ICD-10-CM | POA: Insufficient documentation

## 2016-09-19 DIAGNOSIS — Z87891 Personal history of nicotine dependence: Secondary | ICD-10-CM | POA: Insufficient documentation

## 2016-09-19 DIAGNOSIS — C9210 Chronic myeloid leukemia, BCR/ABL-positive, not having achieved remission: Secondary | ICD-10-CM | POA: Diagnosis not present

## 2016-09-19 DIAGNOSIS — I130 Hypertensive heart and chronic kidney disease with heart failure and stage 1 through stage 4 chronic kidney disease, or unspecified chronic kidney disease: Secondary | ICD-10-CM | POA: Diagnosis not present

## 2016-09-19 DIAGNOSIS — Z7901 Long term (current) use of anticoagulants: Secondary | ICD-10-CM | POA: Insufficient documentation

## 2016-09-19 DIAGNOSIS — I208 Other forms of angina pectoris: Secondary | ICD-10-CM

## 2016-09-19 DIAGNOSIS — I25110 Atherosclerotic heart disease of native coronary artery with unstable angina pectoris: Secondary | ICD-10-CM | POA: Diagnosis not present

## 2016-09-19 DIAGNOSIS — N1831 Chronic kidney disease, stage 3a: Secondary | ICD-10-CM

## 2016-09-19 LAB — CBC
HCT: 30.5 % — ABNORMAL LOW (ref 40.0–52.0)
Hemoglobin: 10 g/dL — ABNORMAL LOW (ref 13.0–18.0)
MCH: 26.2 pg (ref 26.0–34.0)
MCHC: 32.8 g/dL (ref 32.0–36.0)
MCV: 80 fL (ref 80.0–100.0)
PLATELETS: 250 10*3/uL (ref 150–440)
RBC: 3.81 MIL/uL — AB (ref 4.40–5.90)
RDW: 17.2 % — AB (ref 11.5–14.5)
WBC: 9 10*3/uL (ref 3.8–10.6)

## 2016-09-19 LAB — BASIC METABOLIC PANEL
Anion gap: 8 (ref 5–15)
BUN: 32 mg/dL — AB (ref 6–20)
CALCIUM: 9.5 mg/dL (ref 8.9–10.3)
CO2: 23 mmol/L (ref 22–32)
CREATININE: 2 mg/dL — AB (ref 0.61–1.24)
Chloride: 102 mmol/L (ref 101–111)
GFR calc non Af Amer: 30 mL/min — ABNORMAL LOW (ref >60)
GFR, EST AFRICAN AMERICAN: 35 mL/min — AB (ref >60)
Glucose, Bld: 235 mg/dL — ABNORMAL HIGH (ref 65–99)
Potassium: 4.8 mmol/L (ref 3.5–5.1)
Sodium: 133 mmol/L — ABNORMAL LOW (ref 135–145)

## 2016-09-19 LAB — PROTIME-INR
INR: 3.31
PROTHROMBIN TIME: 34.4 s — AB (ref 11.4–15.2)

## 2016-09-19 LAB — GLUCOSE, CAPILLARY
GLUCOSE-CAPILLARY: 138 mg/dL — AB (ref 65–99)
GLUCOSE-CAPILLARY: 193 mg/dL — AB (ref 65–99)

## 2016-09-19 LAB — TROPONIN I
Troponin I: <0.03 ng/mL (ref <0.03)
Troponin I: <0.03 ng/mL (ref <0.03)

## 2016-09-19 MED ORDER — PREGABALIN 50 MG PO CAPS
50.0000 mg | ORAL_CAPSULE | Freq: Three times a day (TID) | ORAL | Status: DC
  Administered 2016-09-19 – 2016-09-20 (×4): 50 mg via ORAL
  Filled 2016-09-19 (×4): qty 1

## 2016-09-19 MED ORDER — HYDROCORTISONE 2.5 % EX OINT
TOPICAL_OINTMENT | Freq: Three times a day (TID) | CUTANEOUS | Status: DC | PRN
  Filled 2016-09-19: qty 20

## 2016-09-19 MED ORDER — NITROGLYCERIN 0.4 MG SL SUBL
0.4000 mg | SUBLINGUAL_TABLET | Freq: Once | SUBLINGUAL | Status: AC
Start: 2016-09-19 — End: 2016-09-19
  Administered 2016-09-19: 0.4 mg via SUBLINGUAL
  Filled 2016-09-19: qty 1

## 2016-09-19 MED ORDER — NITROGLYCERIN 0.4 MG SL SUBL
0.4000 mg | SUBLINGUAL_TABLET | SUBLINGUAL | Status: DC | PRN
Start: 2016-09-19 — End: 2016-09-20

## 2016-09-19 MED ORDER — FENOFIBRATE 160 MG PO TABS
160.0000 mg | ORAL_TABLET | Freq: Every day | ORAL | Status: DC
  Administered 2016-09-20: 160 mg via ORAL
  Filled 2016-09-19: qty 1

## 2016-09-19 MED ORDER — ONDANSETRON HCL 4 MG PO TABS: 4.0000 mg | ORAL_TABLET | Freq: Four times a day (QID) | ORAL | Status: DC | PRN

## 2016-09-19 MED ORDER — MIRTAZAPINE 15 MG PO TABS
15.0000 mg | ORAL_TABLET | Freq: Every day | ORAL | Status: DC
  Administered 2016-09-19: 15 mg via ORAL
  Filled 2016-09-19: qty 1

## 2016-09-19 MED ORDER — ALBUTEROL SULFATE (2.5 MG/3ML) 0.083% IN NEBU
2.5000 mg | INHALATION_SOLUTION | Freq: Four times a day (QID) | RESPIRATORY_TRACT | Status: DC | PRN
  Filled 2016-09-19: qty 3

## 2016-09-19 MED ORDER — ALLOPURINOL 300 MG PO TABS
300.0000 mg | ORAL_TABLET | Freq: Every day | ORAL | Status: DC
  Administered 2016-09-20: 300 mg via ORAL
  Filled 2016-09-19: qty 1

## 2016-09-19 MED ORDER — INSULIN ASPART 100 UNIT/ML ~~LOC~~ SOLN
0.0000 [IU] | Freq: Three times a day (TID) | SUBCUTANEOUS | Status: DC
  Administered 2016-09-19: 1 [IU] via SUBCUTANEOUS
  Administered 2016-09-20 (×2): 5 [IU] via SUBCUTANEOUS
  Administered 2016-09-20: 3 [IU] via SUBCUTANEOUS
  Filled 2016-09-19 (×4): qty 1

## 2016-09-19 MED ORDER — DIGOXIN 125 MCG PO TABS
125.0000 ug | ORAL_TABLET | Freq: Every day | ORAL | Status: DC
  Filled 2016-09-19: qty 1

## 2016-09-19 MED ORDER — INSULIN GLARGINE 100 UNIT/ML ~~LOC~~ SOLN
20.0000 [IU] | Freq: Every day | SUBCUTANEOUS | Status: DC
  Administered 2016-09-19: 20 [IU] via SUBCUTANEOUS
  Filled 2016-09-19 (×3): qty 0.2

## 2016-09-19 MED ORDER — SODIUM CHLORIDE 0.9% FLUSH: 3.0000 mL | INTRAVENOUS | Status: DC | PRN

## 2016-09-19 MED ORDER — TICAGRELOR 90 MG PO TABS
90.0000 mg | ORAL_TABLET | Freq: Two times a day (BID) | ORAL | Status: DC
  Administered 2016-09-19 – 2016-09-20 (×2): 90 mg via ORAL
  Filled 2016-09-19 (×3): qty 1

## 2016-09-19 MED ORDER — WARFARIN SODIUM 4 MG PO TABS: 4.0000 mg | ORAL_TABLET | Freq: Every day | ORAL | Status: DC

## 2016-09-19 MED ORDER — NITROGLYCERIN 0.4 MG SL SUBL: 0.4000 mg | SUBLINGUAL_TABLET | SUBLINGUAL | Status: DC | PRN

## 2016-09-19 MED ORDER — ONDANSETRON HCL 4 MG/2ML IJ SOLN: 4.0000 mg | Freq: Four times a day (QID) | INTRAMUSCULAR | Status: DC | PRN

## 2016-09-19 MED ORDER — MAGNESIUM OXIDE 400 (241.3 MG) MG PO TABS
400.0000 mg | ORAL_TABLET | Freq: Two times a day (BID) | ORAL | Status: DC
  Administered 2016-09-19 – 2016-09-20 (×2): 400 mg via ORAL
  Filled 2016-09-19 (×2): qty 1

## 2016-09-19 MED ORDER — FAMOTIDINE 20 MG PO TABS
20.0000 mg | ORAL_TABLET | Freq: Every day | ORAL | Status: DC
  Administered 2016-09-19: 20 mg via ORAL
  Filled 2016-09-19: qty 1

## 2016-09-19 MED ORDER — ACETAMINOPHEN 500 MG PO TABS: 500.0000 mg | ORAL_TABLET | Freq: Four times a day (QID) | ORAL | Status: DC | PRN

## 2016-09-19 MED ORDER — ATORVASTATIN CALCIUM 20 MG PO TABS
80.0000 mg | ORAL_TABLET | Freq: Every day | ORAL | Status: DC
  Administered 2016-09-19 – 2016-09-20 (×2): 80 mg via ORAL
  Filled 2016-09-19 (×3): qty 4

## 2016-09-19 MED ORDER — SODIUM CHLORIDE 0.9% FLUSH
3.0000 mL | Freq: Two times a day (BID) | INTRAVENOUS | Status: DC
  Administered 2016-09-19 – 2016-09-20 (×2): 3 mL via INTRAVENOUS

## 2016-09-19 MED ORDER — LEVOTHYROXINE SODIUM 25 MCG PO TABS
25.0000 ug | ORAL_TABLET | Freq: Every day | ORAL | Status: DC
  Administered 2016-09-20: 25 ug via ORAL
  Filled 2016-09-19: qty 1

## 2016-09-19 MED ORDER — OPTICHAMBER ADVANTAGE MISC: 1.0000 | Freq: Once | Status: DC

## 2016-09-19 MED ORDER — SODIUM CHLORIDE 0.9 % IV SOLN: 250.0000 mL | INTRAVENOUS | Status: DC | PRN

## 2016-09-19 MED ORDER — LOPERAMIDE HCL 2 MG PO CAPS: 2.0000 mg | ORAL_CAPSULE | Freq: Three times a day (TID) | ORAL | Status: DC | PRN

## 2016-09-19 MED ORDER — ADULT MULTIVITAMIN W/MINERALS CH
1.0000 | ORAL_TABLET | ORAL | Status: DC
  Administered 2016-09-20: 1 via ORAL
  Filled 2016-09-19: qty 1

## 2016-09-19 MED ORDER — METOPROLOL SUCCINATE ER 50 MG PO TB24
50.0000 mg | ORAL_TABLET | ORAL | Status: DC
  Filled 2016-09-19: qty 1

## 2016-09-19 NOTE — Telephone Encounter (Signed)
Lucas Caldwell with Cardiac Rehab brought patient over because he reports 8 out of 10 chest pain with shortness of breath. [name] in wheelchair with his wife next to him. Ailene Ravel reports that he told her that since his stent placement he has not been feeling well. She also mentioned that he has been taking lasix and it is not mentioned on his med list. Reviewed chart and let her know that was discontinued when he was discharged from the hospital, also discussed with her that he should go to ED for evaluation if he is having persistent chest pain. She verbalized understanding with no further questions at this time.

## 2016-09-19 NOTE — Telephone Encounter (Signed)
Nurse with cardiac rehab walked pt to front office , states she was performing pt orientation and pt states he "did not feel well". States he is having CP an 8 out 10 . States ever since his stents were placed on 6/5, pt has been SOB. Please advise

## 2016-09-19 NOTE — ED Notes (Addendum)
Pt reports he started with chest pain and upper abd pain Monday - he reports he has had no change since it started - he states he has been sleeping more - pt reports nausea but denies vomiting - pt has history of stent placement a month ago at [facility] - pt states he feels like he needs to burp and that he is full of gas

## 2016-09-19 NOTE — Progress Notes (Signed)
ANTICOAGULATION CONSULT NOTE - Initial Consult  Pharmacy Consult for VKA Indication: AF  Allergies  Allergen Reactions  . Clopidogrel Anaphylaxis    Other reaction(s): ANAPHYLAXIS;altered mental status;  (electrolytes "out of wack" and confusion per family; THEY DO NOT REMEMBER ANY OTHER REACTION)- MSB 10/10/15  . Ciprofloxacin Itching  . Lyrica [Pregabalin] Itching  . Spironolactone     Other reaction(s): Other (See Comments) gynecomastia  . Plavix [Clopidogrel Bisulfate] Anxiety    Patient Measurements: Height: 5\' 3"  (160 cm) Weight: 194 lb (88 kg) IBW/kg (Calculated) : 56.9 Heparin Dosing Weight:   Vital Signs: Temp: 97.6 F (36.4 C) (07/05 1925) Temp Source: Oral (07/05 1925) BP: 138/59 (07/05 1925) Pulse Rate: 59 (07/05 1925)  Labs:  Recent Labs  09/19/16 1320 09/19/16 1729 09/19/16 1845  HGB 10.0*  --   --   HCT 30.5*  --   --   PLT 250  --   --   LABPROT  --   --  34.4*  INR  --   --  3.31  CREATININE 2.00*  --   --   TROPONINI <0.03 <0.03  --     Estimated Creatinine Clearance: 28.9 mL/min (A) (by C-G formula based on SCr of 2 mg/dL (H)).   Medical History: Past Medical History:  Diagnosis Date  . Arthritis    "probably in his legs" (08/15/2016)  . Asthma   . Blind right eye   . BPH (benign prostatic hyperplasia)   . Breast asymmetry    Left breast is larger, present for several years.  [name] CAD (coronary artery disease)    a. Cath in the late 90's - reportedly ok;  b. 2014 s/p stenting x 2 @ [facility]; c 08/19/16 Cath/PCI with DES -> RCA, plan to treat LM 70% medically. Seen by surgery and felt to be too high risk for CABG.  . Chronic combined systolic (congestive) and diastolic (congestive) heart failure ([place])    a. Previously reduced EF-->50% by echo in 2012;  b. 06/2015 Echo: EF 50-55%  c. 07/2016 Echo: EF 45-50%.  . CKD (chronic kidney disease), stage III   . CML (chronic myelocytic leukemia) ([place])   . GERD (gastroesophageal reflux disease)   . Gout    . Hyperlipemia   . Hypertension   . Hypothyroidism   . Iron deficiency anemia   . Migraine    "in the 1960s" (08/15/2016)  . NICM (nonischemic cardiomyopathy) ([place])    a. Previously reduced EF-->50% by echo in 2012;  b. 06/2015 Echo: EF 50-55%, Gr2 DD, mild MR, mildly dil LA, Ao sclerosis, mild TR;  c. 07/2016 Echo: EF 45-50%, ? inf HK, mild MR, mildly dil LA.  [name] PAF (paroxysmal atrial fibrillation) (HCC)    a. ? Dx 2014-->s/p DCCV;  b. CHA2DS2VASc = 6-->chronic coumadin.  [name] RBBB   . Type II diabetes mellitus (HCC)     Medications:  Infusions:  . sodium chloride      Assessment: 82 yom cc CP with PMH complex CAD with recent stent to RCA, HTN, GERD, HLD, hypothyroidism, CKD 3. Presents with INR 3.3; INR has been elevated for last 3 readings (since 09/04/16). Pharmacy consulted to dose VKA.   Goal of Therapy:  INR 2-3 Monitor platelets by anticoagulation protocol: Yes   Plan:  INR supratherapeutic. Hold dose tonight, recheck INR in AM. Pharmacy will continue to monitor and adjust.  Laural Benes, Pharm.D., BCPS Clinical Pharmacist 09/19/2016,7:59 PM

## 2016-09-19 NOTE — Telephone Encounter (Signed)
Thank you for the update. I agree with ED referral for active chest pain.  [name], MD [facility] Pager: [phone]

## 2016-09-19 NOTE — Progress Notes (Signed)
Incomplete Session Note  Patient Details  Name: Lucas Caldwell MRN: [id number] Date of Birth: 1933/04/02 Referring Provider:  Dr. Nigel Mormon did not complete his rehab session. He was here for his initial medical review and verbalized chest discomfort along with increasing dyspnea. The chest discomfort has been going on for two days and the dyspnea has been since he has had his stent. Patient was taken to the Emergency Room to seek further work up.

## 2016-09-19 NOTE — ED Provider Notes (Signed)
[facility] Emergency Department Provider Note       Time seen: ----------------------------------------- 3:05 PM on 09/19/2016 -----------------------------------------     I have reviewed the triage vital signs and the nursing notes.   HISTORY   Chief Complaint Chest Pain    HPI Lucas Caldwell is a 83 y.o. male who presents to the ED for midsternal chest pain and upper abdominal pain since yesterday. Patient reports feeling bloated in the upper abdomen. Patient had a stent placed about a month ago and complains of persistent shortness of breath with some chest discomfort since then. Patient states he has very little if any energy. He went to cardiac rehabilitation today but was unable to do any exercise because he hasn't felt well and he feels short of breath. Discomfort is 7 out of 10, nothing makes it better or worse.   Past Medical History:  Diagnosis Date  . Arthritis    "probably in his legs" (08/15/2016)  . Asthma   . Blind right eye   . BPH (benign prostatic hyperplasia)   . Breast asymmetry    Left breast is larger, present for several years.  [name] CAD (coronary artery disease)    a. Cath in the late 90's - reportedly ok;  b. 2014 s/p stenting x 2 @ [facility]; c 08/19/16 Cath/PCI with DES -> RCA, plan to treat LM 70% medically. Seen by surgery and felt to be too high risk for CABG.  . Chronic combined systolic (congestive) and diastolic (congestive) heart failure ([place])    a. Previously reduced EF-->50% by echo in 2012;  b. 06/2015 Echo: EF 50-55%  c. 07/2016 Echo: EF 45-50%.  . CKD (chronic kidney disease), stage III   . CML (chronic myelocytic leukemia) ([place])   . GERD (gastroesophageal reflux disease)   . Gout   . Hyperlipemia   . Hypertension   . Hypothyroidism   . Iron deficiency anemia   . Migraine    "in the 1960s" (08/15/2016)  . NICM (nonischemic cardiomyopathy) ([place])    a. Previously reduced EF-->50% by echo in 2012;  b. 06/2015 Echo: EF  50-55%, Gr2 DD, mild MR, mildly dil LA, Ao sclerosis, mild TR;  c. 07/2016 Echo: EF 45-50%, ? inf HK, mild MR, mildly dil LA.  [name] PAF (paroxysmal atrial fibrillation) (HCC)    a. ? Dx 2014-->s/p DCCV;  b. CHA2DS2VASc = 6-->chronic coumadin.  [name] RBBB   . Type II diabetes mellitus [facility])     Patient Active Problem List   Diagnosis Date Noted  . Atrial fibrillation ([place]) [I48.91] 08/28/2016  . Long term (current) use of anticoagulants [Z79.01] 08/28/2016  . Hypertension 08/20/2016  . CAD in native artery 08/19/2016  . Unstable angina ([place]) 08/15/2016  . Acute systolic congestive heart failure ([place])   . Shortness of breath 08/12/2016  . CML (chronic myelocytic leukemia) ([place]) 12/29/2015  . Acute renal failure (ARF) ([place]) 12/08/2015    Past Surgical History:  Procedure Laterality Date  . CATARACT EXTRACTION W/ INTRAOCULAR LENS  IMPLANT, BILATERAL Bilateral   . CORONARY ANGIOPLASTY WITH STENT PLACEMENT  09/2012   2 stents  . CORONARY STENT INTERVENTION N/A 08/19/2016   Procedure: Coronary Stent Intervention;  Surgeon: [name], MD;  Location: [facility];  Service: Cardiovascular;  Laterality: N/A;  . HERNIA REPAIR     "navel"  . LEFT HEART CATH AND CORONARY ANGIOGRAPHY N/A 08/14/2016   Procedure: Left Heart Cath and Coronary Angiography;  Surgeon: [name], MD;  Location: [place] [facility];  Service: Cardiovascular;  Laterality: N/A;  . TOE AMPUTATION Right    "big toe"    Allergies Clopidogrel; Ciprofloxacin; Lyrica [pregabalin]; Spironolactone; and Plavix [clopidogrel bisulfate]  Social History Social History  Substance Use Topics  . Smoking status: Former Smoker    Packs/day: 2.00    Years: 15.00    Types: Cigarettes  . Smokeless tobacco: Never Used     Comment: quit 1979  . Alcohol use No     Comment: previously drank heavily - quit 1979.    Review of Systems Constitutional: Negative for fever. Eyes: Negative for vision changes ENT:  Negative  for congestion, sore throat Cardiovascular: Positive for chest discomfort Respiratory: Positive for shortness of breath Gastrointestinal: Negative for abdominal pain, vomiting and diarrhea. Genitourinary: Negative for dysuria. Musculoskeletal: Negative for back pain. Skin: Negative for rash. Neurological: Negative for headaches, focal weakness or numbness.  All systems negative/normal/unremarkable except as stated in the HPI  ____________________________________________   PHYSICAL EXAM:  VITAL SIGNS: ED Triage Vitals  Enc Vitals Group     BP 09/19/16 1323 117/62     Pulse Rate 09/19/16 1323 (!) 58     Resp 09/19/16 1323 18     Temp 09/19/16 1323 97.9 F (36.6 C)     Temp Source 09/19/16 1323 Oral     SpO2 09/19/16 1323 100 %     Weight 09/19/16 1315 194 lb (88 kg)     Height 09/19/16 1315 5\' 3"  (1.6 m)     Head Circumference --      Peak Flow --      Pain Score 09/19/16 1314 8     Pain Loc --      Pain Edu? --      Excl. in [place]? --     Constitutional: Alert and oriented. Well appearing and in no distress. Eyes: Conjunctivae are normal. Normal extraocular movements. ENT   Head: Normocephalic and atraumatic.   Nose: No congestion/rhinnorhea.   Mouth/Throat: Mucous membranes are moist.   Neck: No stridor. Cardiovascular: Normal rate, regular rhythm. No murmurs, rubs, or gallops. Respiratory: Normal respiratory effort without tachypnea nor retractions. Breath sounds are clear and equal bilaterally. No wheezes/rales/rhonchi. Gastrointestinal: Soft and nontender. Normal bowel sounds Musculoskeletal: Nontender with normal range of motion in extremities. No lower extremity tenderness nor edema. Neurologic:  Normal speech and language. No gross focal neurologic deficits are appreciated.  Skin:  Skin is warm, dry and intact. No rash noted. Psychiatric: Mood and affect are normal. Speech and behavior are normal.  ____________________________________________  EKG:  Interpreted by me.Sinus rhythm with first degree AV block, rate is 61 bpm, wide QRS, right bundle branch block, left axis deviation.  ____________________________________________  ED COURSE:  Pertinent labs & imaging results that were available during my care of the patient were reviewed by me and considered in my medical decision making (see chart for details). Patient presents for chest discomfort and dyspnea, we will assess with labs and imaging as indicated.   Procedures ____________________________________________   LABS (pertinent positives/negatives)  Labs Reviewed  BASIC METABOLIC PANEL - Abnormal; Notable for the following:       Result Value   Sodium 133 (*)    Glucose, Bld 235 (*)    BUN 32 (*)    Creatinine, Ser 2.00 (*)    GFR calc non Af Amer 30 (*)    GFR calc Af Amer 35 (*)    All other components within normal limits  CBC - Abnormal; Notable for the following:    RBC 3.81 (*)    Hemoglobin 10.0 (*)    HCT 30.5 (*)    RDW 17.2 (*)    All other components within normal limits  TROPONIN I    RADIOLOGY  Chest x-ray Is unremarkable ____________________________________________  FINAL ASSESSMENT AND PLAN  Unstable angina  Plan: Patient's labs and imaging were dictated above. Patient had presented for symptoms of unstable angina with chest discomfort and dyspnea. Patient states he has been progressively dyspneic since his last stent placement. I will discuss with cardiology, he will likely benefit from hospital admission.   Earleen Newport, MD   Note: This note was generated in part or whole with voice recognition software. Voice recognition is usually quite accurate but there are transcription errors that can and very often do occur. I apologize for any typographical errors that were not detected and corrected.     Earleen Newport, MD 09/19/16 1539

## 2016-09-19 NOTE — H&P (Signed)
[place] [facility] [place] NAME: Lucas Caldwell    MR#:  [id number]  DATE OF BIRTH:  11/25/1933  DATE OF ADMISSION:  09/19/2016  PRIMARY CARE PHYSICIAN: Juanell Fairly, MD   REQUESTING/REFERRING PHYSICIAN: Lenise Arena MD  CHIEF COMPLAINT:   Chief Complaint  Patient presents with  . Chest Pain    HISTORY OF PRESENT ILLNESS: Lucas Caldwell  is a 83 y.o. male with a known history of  Complex coronary artery disease with a recent stent to the RCA, a Center hypertension, diabetes type 2, GERD, hyperlipidemia, hypothyroidism, chronic kidney disease stage III who currently is being followed by Dr. and for his coronary artery disease. Who presents to the ED with complaint of shortness of breath and chest pain since his stent placement about a month ago. Patient was at cardiac rehabilitation and his symptoms got worse. He complains of pressure in the substernal region of his chest. As well as shortness of breath. Complains of dyspnea on exertion. Also complains of swelling of his lower extremity. Denies any nausea vomiting or diarrhea denies any urinary symptoms. PAST MEDICAL HISTORY:   Past Medical History:  Diagnosis Date  . Arthritis    "probably in his legs" (08/15/2016)  . Asthma   . Blind right eye   . BPH (benign prostatic hyperplasia)   . Breast asymmetry    Left breast is larger, present for several years.  [name] CAD (coronary artery disease)    a. Cath in the late 90's - reportedly ok;  b. 2014 s/p stenting x 2 @ [facility]; c 08/19/16 Cath/PCI with DES -> RCA, plan to treat LM 70% medically. Seen by surgery and felt to be too high risk for CABG.  . Chronic combined systolic (congestive) and diastolic (congestive) heart failure ([place])    a. Previously reduced EF-->50% by echo in 2012;  b. 06/2015 Echo: EF 50-55%  c. 07/2016 Echo: EF 45-50%.  . CKD (chronic kidney disease), stage III   . CML (chronic myelocytic leukemia) ([place])   . GERD (gastroesophageal reflux  disease)   . Gout   . Hyperlipemia   . Hypertension   . Hypothyroidism   . Iron deficiency anemia   . Migraine    "in the 1960s" (08/15/2016)  . NICM (nonischemic cardiomyopathy) ([place])    a. Previously reduced EF-->50% by echo in 2012;  b. 06/2015 Echo: EF 50-55%, Gr2 DD, mild MR, mildly dil LA, Ao sclerosis, mild TR;  c. 07/2016 Echo: EF 45-50%, ? inf HK, mild MR, mildly dil LA.  [name] PAF (paroxysmal atrial fibrillation) (HCC)    a. ? Dx 2014-->s/p DCCV;  b. CHA2DS2VASc = 6-->chronic coumadin.  [name] RBBB   . Type II diabetes mellitus ([place])     PAST SURGICAL HISTORY: Past Surgical History:  Procedure Laterality Date  . CATARACT EXTRACTION W/ INTRAOCULAR LENS  IMPLANT, BILATERAL Bilateral   . CORONARY ANGIOPLASTY WITH STENT PLACEMENT  09/2012   2 stents  . CORONARY STENT INTERVENTION N/A 08/19/2016   Procedure: Coronary Stent Intervention;  Surgeon: [name], MD;  Location: [facility];  Service: Cardiovascular;  Laterality: N/A;  . HERNIA REPAIR     "navel"  . LEFT HEART CATH AND CORONARY ANGIOGRAPHY N/A 08/14/2016   Procedure: Left Heart Cath and Coronary Angiography;  Surgeon: [name], MD;  Location: [facility];  Service: Cardiovascular;  Laterality: N/A;  . TOE AMPUTATION Right    "big toe"    SOCIAL HISTORY:  Social History  Substance Use Topics  . Smoking status: Former Smoker    Packs/day: 2.00    Years: 15.00    Types: Cigarettes  . Smokeless tobacco: Never Used     Comment: quit 1979  . Alcohol use No     Comment: previously drank heavily - quit 1979.    FAMILY HISTORY:  Family History  Problem Relation Age of Onset  . Brain cancer Father   . Diabetes Sister   . Heart attack Sister     DRUG ALLERGIES:  Allergies  Allergen Reactions  . Clopidogrel Anaphylaxis    Other reaction(s): ANAPHYLAXIS;altered mental status;  (electrolytes "out of wack" and confusion per family; THEY DO NOT REMEMBER ANY OTHER REACTION)- MSB 10/10/15  .  Ciprofloxacin Itching  . Lyrica [Pregabalin] Itching  . Spironolactone     Other reaction(s): Other (See Comments) gynecomastia  . Plavix [Clopidogrel Bisulfate] Anxiety    REVIEW OF SYSTEMS:   CONSTITUTIONAL: No fever, fatigue or weakness.  EYES: No blurred or double vision.  EARS, NOSE, AND THROAT: No tinnitus or ear pain.  RESPIRATORY: No cough, Positive shortness of breath, wheezing or hemoptysis.  CARDIOVASCULAR: Positive chest pain, positive orthopnea, positive edema.  GASTROINTESTINAL: No nausea, vomiting, diarrhea or abdominal pain.  GENITOURINARY: No dysuria, hematuria.  ENDOCRINE: No polyuria, nocturia,  HEMATOLOGY: No anemia, easy bruising or bleeding SKIN: No rash or lesion. MUSCULOSKELETAL: No joint pain or arthritis.   NEUROLOGIC: No tingling, numbness, weakness.  PSYCHIATRY: No anxiety or depression.   MEDICATIONS AT HOME:  Prior to Admission medications   Medication Sig Start Date End Date Taking? Authorizing Provider  acetaminophen (TYLENOL) 500 MG tablet Take 500 mg by mouth every 6 (six) hours as needed.   Yes [provider]  albuterol (PROVENTIL HFA;VENTOLIN HFA) 108 (90 BASE) MCG/ACT inhaler Inhale 2 puffs into the lungs every 6 (six) hours as needed for wheezing or shortness of breath. 11/04/14  Yes Ahmed Prima, MD  allopurinol (ZYLOPRIM) 300 MG tablet Take 300 mg by mouth daily.  11/10/15  Yes [provider]  atorvastatin (LIPITOR) 80 MG tablet Take 1 tablet (80 mg total) by mouth daily at 6 PM. 08/20/16  Yes Reino Bellis B, NP  bosutinib (BOSULIF) 100 MG tablet Take 200 mg by mouth daily with supper. Take with food.   Yes [provider]  digoxin (LANOXIN) 0.125 MG tablet Take 125 mcg by mouth daily.  11/10/15  Yes [provider]  famotidine (PEPCID) 20 MG tablet Take 20 mg by mouth at bedtime.   Yes [provider]  fenofibrate 160 MG tablet Take 1 tablet (160 mg total) by mouth daily. 08/21/16  Yes Reino Bellis B, NP  hydrocortisone 2.5 % ointment Apply topically 3 (three) times daily. 07/20/16  Yes Triplett, Cari B, FNP  LANTUS SOLOSTAR 100 UNIT/ML Solostar Pen Inject 20 Units into the skin at bedtime. Patient taking differently: Inject 50 Units into the skin at bedtime.  12/11/15  Yes Demetrios Loll, MD  levothyroxine (SYNTHROID, LEVOTHROID) 25 MCG tablet Take 25 mcg by mouth daily before breakfast.  10/27/15  Yes [provider]  loperamide (IMODIUM A-D) 2 MG tablet Take 2 mg by mouth 3 (three) times daily as needed for diarrhea or loose stools.    Yes [provider]  magnesium oxide (MAG-OX) 400 MG tablet Take 400 mg by mouth 2 (two) times daily.   Yes [provider]  metoprolol succinate (TOPROL-XL) 50 MG 24 hr tablet Take 50  mg by mouth every morning. 12/04/15  Yes [provider]  mirtazapine (REMERON) 15 MG tablet Take 15 mg by mouth at bedtime. 11/02/15  Yes [provider]  Multiple Vitamins-Minerals (MULTIVITAMIN ADULTS 50+ PO) Take 1 tablet by mouth every morning.   Yes [provider]  nepafenac (NEVANAC) 0.1 % ophthalmic suspension Place 1 drop into both eyes 3 (three) times daily as needed.   Yes [provider]  nitroGLYCERIN (NITROSTAT) 0.4 MG SL tablet Place 1 tablet (0.4 mg total) under the tongue every 5 (five) minutes x 3 doses as needed for chest pain. 08/20/16  Yes Reino Bellis B, NP  pregabalin (LYRICA) 50 MG capsule Take 50 mg by mouth 3 (three) times daily.   Yes [provider]  ticagrelor (BRILINTA) 90 MG TABS tablet Take 1 tablet (90 mg total) by mouth 2 (two) times daily. 08/20/16  Yes Reino Bellis B, NP  warfarin (COUMADIN) 2 MG tablet Take 4 mg by mouth at bedtime.   Yes [provider]  Spacer/Aero-Holding Chambers [facility] ADVANTAGE) MISC 1 each by Other route once. 11/04/14   Ahmed Prima, MD      PHYSICAL EXAMINATION:   VITAL SIGNS: Blood pressure 117/62, pulse (!) 58,  temperature 97.9 F (36.6 C), temperature source Oral, resp. rate 18, height 5\' 3"  (1.6 m), weight 194 lb (88 kg), SpO2 100 %.  GENERAL:  83 y.o.-year-old patient lying in the bed with no acute distress.  EYES: Pupils equal, round, reactive to light and accommodation. No scleral icterus. Extraocular muscles intact.  HEENT: Head atraumatic, normocephalic. Oropharynx and nasopharynx clear.  NECK:  Supple, no jugular venous distention. No thyroid enlargement, no tenderness.  LUNGS: Normal breath sounds bilaterally, no wheezing, rales,rhonchi or crepitation. No use of accessory muscles of respiration.  CARDIOVASCULAR: S1, S2 normal. No murmurs, rubs, or gallops.  ABDOMEN: Soft, nontender, nondistended. Bowel sounds present. No organomegaly or mass.  EXTREMITIES:Nonpitting pedal edema, cyanosis, or clubbing.  NEUROLOGIC: Cranial nerves II through XII are intact. Muscle strength 5/5 in all extremities. Sensation intact. Gait not checked.  PSYCHIATRIC: The patient is alert and oriented x 3.  SKIN: No obvious rash, lesion, or ulcer.   LABORATORY PANEL:   CBC  Recent Labs Lab 09/19/16 1320  WBC 9.0  HGB 10.0*  HCT 30.5*  PLT 250  MCV 80.0  MCH 26.2  MCHC 32.8  RDW 17.2*   ------------------------------------------------------------------------------------------------------------------  Chemistries   Recent Labs Lab 09/19/16 1320  NA 133*  K 4.8  CL 102  CO2 23  GLUCOSE 235*  BUN 32*  CREATININE 2.00*  CALCIUM 9.5   ------------------------------------------------------------------------------------------------------------------ estimated creatinine clearance is 28.9 mL/min (A) (by C-G formula based on SCr of 2 mg/dL (H)). ------------------------------------------------------------------------------------------------------------------ No results for input(s): TSH, T4TOTAL, T3FREE, THYROIDAB in the last 72 hours.  Invalid input(s): FREET3   Coagulation profile No results  for input(s): INR, PROTIME in the last 168 hours. ------------------------------------------------------------------------------------------------------------------- No results for input(s): DDIMER in the last 72 hours. -------------------------------------------------------------------------------------------------------------------  Cardiac Enzymes  Recent Labs Lab 09/19/16 1320  TROPONINI <0.03   ------------------------------------------------------------------------------------------------------------------ Invalid input(s): POCBNP  ---------------------------------------------------------------------------------------------------------------  Urinalysis    Component Value Date/Time   COLORURINE STRAW (A) 12/08/2015 1950   APPEARANCEUR CLEAR (A) 12/08/2015 1950   APPEARANCEUR Clear 05/17/2012 1442   LABSPEC 1.006 12/08/2015 1950   LABSPEC 1.010 05/17/2012 1442   PHURINE 5.0 12/08/2015 1950   GLUCOSEU NEGATIVE 12/08/2015 1950   GLUCOSEU >=500 05/17/2012 1442   HGBUR NEGATIVE 12/08/2015 1950   BILIRUBINUR  NEGATIVE 12/08/2015 1950   BILIRUBINUR Negative 05/17/2012 [place] 12/08/2015 1950   PROTEINUR NEGATIVE 12/08/2015 1950   NITRITE NEGATIVE 12/08/2015 1950   LEUKOCYTESUR NEGATIVE 12/08/2015 1950   LEUKOCYTESUR Negative 05/17/2012 1442     RADIOLOGY: Dg Chest 2 View  Result Date: 09/19/2016 CLINICAL DATA:  Chest pain and shortness of breath EXAM: CHEST  2 VIEW COMPARISON:  08/16/2016 FINDINGS: Normal heart size and stable mediastinal contours. Aortic atherosclerotic calcification. Borderline interstitial coarsening on AP view, but not seen laterally. There is no edema, consolidation, effusion, or pneumothorax. No acute osseous findings. IMPRESSION: No evidence of active disease. Electronically Signed   By: Monte Fantasia M.D.   On: 09/19/2016 13:51    EKG: Orders placed or performed during the hospital encounter of 09/19/16  . ED EKG within 10  minutes  . ED EKG within 10 minutes    IMPRESSION AND PLAN:  Patient is 83 year old with known history of coronary artery disease with previous stent placement presents with chest pain shortness of  1. Chest pain suspect due angina. Troponin is negative no evidence of acute MI Continue therapy as taking at home [name] currently taking Coumadin and brilinta I will ask his cardiologist to see the ER physician has already discussed the case with Dr. Mitzi Hansen recommends admission  2. Essential hypertension continue therapy with Toprol  3. Chronic combined systolic and diastolic CHF chest x-ray negative I will continue his digoxin Questionable Lasix per cardiology however his creatinine is elevated compared to baseline  4. Chronic kidney disease stage III slightly worst needs outpatient nephrology follow-up  5. Hyperlipidemia unspecified continue Lipitor  6. hypothyroidism - syntroid  7. Misc: coumadin for dvt proph    All the records are reviewed and case discussed with ED provider. Management plans discussed with the patient, family and they are in agreement.  CODE STATUS: Code Status History    Date Active Date Inactive Code Status Order ID Comments User Context   08/15/2016  4:49 PM 08/20/2016  7:29 PM Full Code [id number]  Rogelia Mire, NP Inpatient   08/12/2016  2:08 PM 08/15/2016  2:05 PM Full Code [id number]  Baxter Hire, MD Inpatient   12/08/2015  8:21 PM 12/11/2015  1:15 PM Full Code [id number]  Epifanio Lesches, MD ED       TOTAL TIME TAKING CARE OF THIS PATIENT 55 minutes.    Dustin Flock M.D on 09/19/2016 at 4:05 PM  Between 7am to 6pm - Pager - [phone]  After 6pm go to [email or web address] - password [email or web address] [facility]  [facility]  Office  [phone]  CC: Primary care physician; Juanell Fairly, MD

## 2016-09-19 NOTE — ED Triage Notes (Addendum)
Patient c/o mid-sternal chest pain and upper abdominal pain since yesterday. Patient c/o feeling "bloated in upper abdomen." Patient had a cardiac stent placed in June. Patient c/o shortness of breath that is worse with bending over and exertion. Per Cardiac Rehab nurse, patient was in orientation, but declined to do a 6-minute walk stating that he hadn't felt well and wanted to see his doctor first.

## 2016-09-20 DIAGNOSIS — I2510 Atherosclerotic heart disease of native coronary artery without angina pectoris: Secondary | ICD-10-CM

## 2016-09-20 DIAGNOSIS — N183 Chronic kidney disease, stage 3 (moderate): Secondary | ICD-10-CM

## 2016-09-20 DIAGNOSIS — I208 Other forms of angina pectoris: Secondary | ICD-10-CM | POA: Diagnosis not present

## 2016-09-20 DIAGNOSIS — N1831 Chronic kidney disease, stage 3a: Secondary | ICD-10-CM

## 2016-09-20 LAB — BASIC METABOLIC PANEL
ANION GAP: 6 (ref 5–15)
BUN: 29 mg/dL — ABNORMAL HIGH (ref 6–20)
CALCIUM: 9.1 mg/dL (ref 8.9–10.3)
CO2: 22 mmol/L (ref 22–32)
Chloride: 106 mmol/L (ref 101–111)
Creatinine, Ser: 1.61 mg/dL — ABNORMAL HIGH (ref 0.61–1.24)
GFR calc Af Amer: 45 mL/min — ABNORMAL LOW (ref >60)
GFR calc non Af Amer: 39 mL/min — ABNORMAL LOW (ref >60)
Glucose, Bld: 207 mg/dL — ABNORMAL HIGH (ref 65–99)
POTASSIUM: 4.5 mmol/L (ref 3.5–5.1)
Sodium: 134 mmol/L — ABNORMAL LOW (ref 135–145)

## 2016-09-20 LAB — TROPONIN I

## 2016-09-20 LAB — CBC
HEMATOCRIT: 28.2 % — AB (ref 40.0–52.0)
HEMOGLOBIN: 9.5 g/dL — AB (ref 13.0–18.0)
MCH: 26.6 pg (ref 26.0–34.0)
MCHC: 33.5 g/dL (ref 32.0–36.0)
MCV: 79.5 fL — AB (ref 80.0–100.0)
Platelets: 230 10*3/uL (ref 150–440)
RBC: 3.55 MIL/uL — AB (ref 4.40–5.90)
RDW: 17 % — AB (ref 11.5–14.5)
WBC: 8.9 10*3/uL (ref 3.8–10.6)

## 2016-09-20 LAB — PROTIME-INR
INR: 3.47
Prothrombin Time: 35.7 seconds — ABNORMAL HIGH (ref 11.4–15.2)

## 2016-09-20 LAB — DIGOXIN LEVEL: DIGOXIN LVL: 0.7 ng/mL — AB (ref 0.8–2.0)

## 2016-09-20 LAB — GLUCOSE, CAPILLARY
GLUCOSE-CAPILLARY: 218 mg/dL — AB (ref 65–99)
GLUCOSE-CAPILLARY: 274 mg/dL — AB (ref 65–99)
Glucose-Capillary: 296 mg/dL — ABNORMAL HIGH (ref 65–99)

## 2016-09-20 MED ORDER — ISOSORBIDE MONONITRATE ER 30 MG PO TB24: 30.0000 mg | ORAL_TABLET | Freq: Every day | ORAL | 1 refills | Status: DC

## 2016-09-20 MED ORDER — ISOSORBIDE MONONITRATE ER 30 MG PO TB24
30.0000 mg | ORAL_TABLET | Freq: Every day | ORAL | Status: DC
  Administered 2016-09-20: 30 mg via ORAL
  Filled 2016-09-20: qty 1

## 2016-09-20 MED ORDER — METOPROLOL SUCCINATE ER 50 MG PO TB24
50.0000 mg | ORAL_TABLET | Freq: Every day | ORAL | Status: DC
  Filled 2016-09-20: qty 1

## 2016-09-20 MED ORDER — HYDRALAZINE HCL 10 MG PO TABS
10.0000 mg | ORAL_TABLET | Freq: Three times a day (TID) | ORAL | Status: DC
  Administered 2016-09-20: 10 mg via ORAL
  Filled 2016-09-20: qty 1

## 2016-09-20 NOTE — Care Management (Signed)
Patient placed in observation with chest pain.  Troponins are negative.  Cardiology consult is pending.   Patient presents from home and independent in all adls.  Denies issues accessing medical care, obtaining medications, maintaining housing, utilities and food.   No discharge needs identified at present time.

## 2016-09-20 NOTE — Care Management Obs Status (Signed)
[facility] NOTIFICATION   Patient Details  Name: LONNELL CHAPUT MRN: [id number] Date of Birth: 11-03-1933   Medicare Observation Status Notification Given:  Yes    Katrina Stack, RN 09/20/2016, 9:35 AM

## 2016-09-20 NOTE — Progress Notes (Signed)
ANTICOAGULATION CONSULT NOTE - Initial Consult  Pharmacy Consult for VKA Indication: AF  Allergies  Allergen Reactions  . Clopidogrel Anaphylaxis    Other reaction(s): ANAPHYLAXIS;altered mental status;  (electrolytes "out of wack" and confusion per family; THEY DO NOT REMEMBER ANY OTHER REACTION)- MSB 10/10/15  . Ciprofloxacin Itching  . Lyrica [Pregabalin] Itching  . Spironolactone     Other reaction(s): Other (See Comments) gynecomastia  . Plavix [Clopidogrel Bisulfate] Anxiety    Patient Measurements: Height: 5\' 3"  (160 cm) Weight: 194 lb (88 kg) IBW/kg (Calculated) : 56.9 Heparin Dosing Weight:   Vital Signs: Temp: 97.6 F (36.4 C) (07/06 0826) Temp Source: Oral (07/06 0826) BP: 130/60 (07/06 0826) Pulse Rate: 59 (07/06 0826)  Labs:  Recent Labs  09/19/16 1320 09/19/16 1729 09/19/16 1845 09/19/16 2314 09/20/16 0442  HGB 10.0*  --   --   --  9.5*  HCT 30.5*  --   --   --  28.2*  PLT 250  --   --   --  230  LABPROT  --   --  34.4*  --  35.7*  INR  --   --  3.31  --  3.47  CREATININE 2.00*  --   --   --  1.61*  TROPONINI <0.03 <0.03  --  <0.03 <0.03    Estimated Creatinine Clearance: 35.9 mL/min (A) (by C-G formula based on SCr of 1.61 mg/dL (H)).   Medical History: Past Medical History:  Diagnosis Date  . Arthritis    "probably in his legs" (08/15/2016)  . Asthma   . Blind right eye   . BPH (benign prostatic hyperplasia)   . Breast asymmetry    Left breast is larger, present for several years.  [name] CAD (coronary artery disease)    a. Cath in the late 90's - reportedly ok;  b. 2014 s/p stenting x 2 @ [facility]; c 08/19/16 Cath/PCI with DES -> RCA, plan to treat LM 70% medically. Seen by surgery and felt to be too high risk for CABG.  . Chronic combined systolic (congestive) and diastolic (congestive) heart failure ([place])    a. Previously reduced EF-->50% by echo in 2012;  b. 06/2015 Echo: EF 50-55%  c. 07/2016 Echo: EF 45-50%.  . CKD (chronic kidney disease),  stage III   . CML (chronic myelocytic leukemia) ([place])   . GERD (gastroesophageal reflux disease)   . Gout   . Hyperlipemia   . Hypertension   . Hypothyroidism   . Iron deficiency anemia   . Migraine    "in the 1960s" (08/15/2016)  . NICM (nonischemic cardiomyopathy) ([place])    a. Previously reduced EF-->50% by echo in 2012;  b. 06/2015 Echo: EF 50-55%, Gr2 DD, mild MR, mildly dil LA, Ao sclerosis, mild TR;  c. 07/2016 Echo: EF 45-50%, ? inf HK, mild MR, mildly dil LA.  [name] PAF (paroxysmal atrial fibrillation) (HCC)    a. ? Dx 2014-->s/p DCCV;  b. CHA2DS2VASc = 6-->chronic coumadin.  [name] RBBB   . Type II diabetes mellitus (HCC)     Medications:  Infusions:  . sodium chloride      Assessment: 82 yom cc CP with PMH complex CAD with recent stent to RCA, HTN, GERD, HLD, hypothyroidism, CKD 3. Presented with INR 3.3; INR has been elevated for last 3 readings (since 09/04/16). Pharmacy consulted to dose VKA.   Home Regimen: warfarin 4mg  daily   DATE INR DOSE 7/5 3.3 HELD 7/6 3.47 HELD  Goal of Therapy:  INR 2-3 Monitor platelets by anticoagulation protocol: Yes   Plan:  7/6 INR remains supratherapeutic. Hold dose tonight, recheck INR in AM. Pharmacy will continue to monitor and adjust.  Kelsye Loomer M Isys Tietje, Pharm.D., BCPS Clinical Pharmacist 09/20/2016,10:59 AM

## 2016-09-20 NOTE — Progress Notes (Signed)
Inpatient Diabetes Program Recommendations  AACE/ADA: New Consensus Statement on Inpatient Glycemic Control (2015)  Target Ranges:  Prepandial:   less than 140 mg/dL      Peak postprandial:   less than 180 mg/dL (1-2 hours)      Critically ill patients:  140 - 180 mg/dL   Results for Lucas Caldwell, Lucas Caldwell (MRN [id number]) as of 09/20/2016 10:11  Ref. Range 09/19/2016 17:42 09/19/2016 20:41 09/20/2016 07:34  Glucose-Capillary Latest Ref Range: 65 - 99 mg/dL 138 (H) 193 (H) 218 (H)    Admit with: CP  History: DM, CKD  Home DM Meds: Lantus 50 units QHS  Current Insulin Orders: Lantus 20 units QHS      Novolog Sensitive Correction Scale/ SSI (0-9 units) TID AC      MD- Note patient takes larger dose of Lantus at home (50 units QHS per home Med Rec list).  Please consider increasing Lantus to 35 units QHS (70% total home dose)    --Will follow patient during hospitalization--  Wyn Quaker RN, MSN, CDE Diabetes Coordinator Inpatient Glycemic Control Team Team Pager: [phone] (8a-5p)

## 2016-09-20 NOTE — Plan of Care (Signed)
Problem: Pain Managment: Goal: General experience of comfort will improve Outcome: Progressing No complaints of pain this shift will continue to monitor.  Problem: Activity: Goal: Risk for activity intolerance will decrease Outcome: Completed/Met Date Met: 09/20/16 Up OOB independently, tolerating well.

## 2016-09-20 NOTE — Progress Notes (Signed)
IV and tele removed. Discharge instructions given to patient along with hard copy prescription. Verbalized understanding. No distress at this time. Wife is at bedside and will be transporting patient home.

## 2016-09-20 NOTE — Plan of Care (Signed)
Problem: Pain Managment: Goal: General experience of comfort will improve Outcome: Progressing No c/o of pain, will continue to monitor.

## 2016-09-20 NOTE — Discharge Summary (Signed)
[place] [facility] [place] NAME: Daisean Brodhead    MR#:  [id number]  DATE OF BIRTH:  [birth year]  DATE OF ADMISSION:  09/19/2016 ADMITTING PHYSICIAN: Dustin Flock, MD  DATE OF DISCHARGE: 09/20/2016  PRIMARY CARE PHYSICIAN: Juanell Fairly, MD    ADMISSION DIAGNOSIS:  Unstable angina ([place]) [I20.0]  DISCHARGE DIAGNOSIS:  Principal Problem:   Stable angina ([place]) Active Problems:   CML (chronic myelocytic leukemia) ([place])   CAD in native artery   PAF (paroxysmal atrial fibrillation) ([place])   CKD (chronic kidney disease), stage III   SECONDARY DIAGNOSIS:   Past Medical History:  Diagnosis Date  . Arthritis    "probably in his legs" (08/15/2016)  . Asthma   . Blind right eye   . BPH (benign prostatic hyperplasia)   . Breast asymmetry    Left breast is larger, present for several years.  [name] CAD (coronary artery disease)    a. Cath in the late 90's - reportedly ok;  b. 2014 s/p stenting x 2 @ [facility]; c 08/19/16 Cath/PCI with DES -> RCA, plan to treat LM 70% medically. Seen by surgery and felt to be too high risk for CABG.  . Chronic combined systolic (congestive) and diastolic (congestive) heart failure ([place])    a. Previously reduced EF-->50% by echo in 2012;  b. 06/2015 Echo: EF 50-55%  c. 07/2016 Echo: EF 45-50%.  . CKD (chronic kidney disease), stage III   . CML (chronic myelocytic leukemia) ([place])   . GERD (gastroesophageal reflux disease)   . Gout   . Hyperlipemia   . Hypertension   . Hypothyroidism   . Iron deficiency anemia   . Migraine    "in the 1960s" (08/15/2016)  . NICM (nonischemic cardiomyopathy) ([place])    a. Previously reduced EF-->50% by echo in 2012;  b. 06/2015 Echo: EF 50-55%, Gr2 DD, mild MR, mildly dil LA, Ao sclerosis, mild TR;  c. 07/2016 Echo: EF 45-50%, ? inf HK, mild MR, mildly dil LA.  [name] PAF (paroxysmal atrial fibrillation) (HCC)    a. ? Dx 2014-->s/p DCCV;  b. CHA2DS2VASc = 6-->chronic coumadin.  [name] RBBB   . Type II diabetes  [facility])     HOSPITAL COURSE:   83 year old male with past medical history of coronary artery disease, paroxysmal atrial fibrillation, diabetes, hypertension, hyperlipidemia, hypothyroidism, GERD, history of CML, BPH, osteoarthritis who presented to the hospital due to chest pain.   1. Chest pain-given patient's risk factors and his known history of multivessel coronary disease he was observed overnight in the hospital. His cardiac markers have remained negative. -he was seen by cardiology who recommended putting him on low-dose Imdur.  Patient presently is asymptomatic with no chest pain. If patient continues to have chest pain that would consider high risk PCI but not to be done here to be done at [facility]. -She will continue his maintenance meds including brillinta, metoprolol, atorvastatin. Patient will continue follow-up with cardiology as an outpatient.  2. Paroxysmal atrial fibrillation-patient is currently rate controlled. His heart rate is in the 50s to 60s. -He will continue his metoprolol, he is on warfarin his INR is supratherapeutic. -he will also continue digoxin.  3. Hypothyroidism-patient will continue his Synthroid.  4. Hyperlipidemia-patient will continue his atorvastatin.  5. Diabetes - pt. Will cont. His Lantus. BS stable while in hospital.   6. DM Neuropathy - cont. Lyrica.   7. Hx of Gout - no acute attack. Pt. Will cont. Allopurinol.  DISCHARGE CONDITIONS:   Stable.   CONSULTS OBTAINED:  Treatment Team:  Wellington Hampshire, MD  DRUG ALLERGIES:   Allergies  Allergen Reactions  . Clopidogrel Anaphylaxis    Other reaction(s): ANAPHYLAXIS;altered mental status;  (electrolytes "out of wack" and confusion per family; THEY DO NOT REMEMBER ANY OTHER REACTION)- MSB 10/10/15  . Ciprofloxacin Itching  . Lyrica [Pregabalin] Itching  . Spironolactone     Other reaction(s): Other (See Comments) gynecomastia  . Plavix [Clopidogrel Bisulfate] Anxiety     DISCHARGE MEDICATIONS:   Allergies as of 09/20/2016      Reactions   Clopidogrel Anaphylaxis   Other reaction(s): ANAPHYLAXIS;altered mental status;  (electrolytes "out of wack" and confusion per family; THEY DO NOT REMEMBER ANY OTHER REACTION)- MSB 10/10/15   Ciprofloxacin Itching   Lyrica [pregabalin] Itching   Spironolactone    Other reaction(s): Other (See Comments) gynecomastia   Plavix [clopidogrel Bisulfate] Anxiety      Medication List    TAKE these medications   acetaminophen 500 MG tablet Commonly known as:  TYLENOL Take 500 mg by mouth every 6 (six) hours as needed.   albuterol 108 (90 Base) MCG/ACT inhaler Commonly known as:  PROVENTIL HFA;VENTOLIN HFA Inhale 2 puffs into the lungs every 6 (six) hours as needed for wheezing or shortness of breath.   allopurinol 300 MG tablet Commonly known as:  ZYLOPRIM Take 300 mg by mouth daily.   atorvastatin 80 MG tablet Commonly known as:  LIPITOR Take 1 tablet (80 mg total) by mouth daily at 6 PM.   bosutinib 100 MG tablet Commonly known as:  BOSULIF Take 200 mg by mouth daily with supper. Take with food.   digoxin 0.125 MG tablet Commonly known as:  LANOXIN Take 125 mcg by mouth daily.   famotidine 20 MG tablet Commonly known as:  PEPCID Take 20 mg by mouth at bedtime.   fenofibrate 160 MG tablet Take 1 tablet (160 mg total) by mouth daily.   hydrocortisone 2.5 % ointment Apply topically 3 (three) times daily.   isosorbide mononitrate 30 MG 24 hr tablet Commonly known as:  IMDUR Take 1 tablet (30 mg total) by mouth daily. Start taking on:  09/21/2016   LANTUS SOLOSTAR 100 UNIT/ML Solostar Pen Generic drug:  Insulin Glargine Inject 20 Units into the skin at bedtime. What changed:  how much to take   levothyroxine 25 MCG tablet Commonly known as:  SYNTHROID, LEVOTHROID Take 25 mcg by mouth daily before breakfast.   loperamide 2 MG tablet Commonly known as:  IMODIUM A-D Take 2 mg by mouth 3 (three)  times daily as needed for diarrhea or loose stools.   magnesium oxide 400 MG tablet Commonly known as:  MAG-OX Take 400 mg by mouth 2 (two) times daily.   metoprolol succinate 50 MG 24 hr tablet Commonly known as:  TOPROL-XL Take 50 mg by mouth every morning.   mirtazapine 15 MG tablet Commonly known as:  REMERON Take 15 mg by mouth at bedtime.   MULTIVITAMIN ADULTS 50+ PO Take 1 tablet by mouth every morning.   nepafenac 0.1 % ophthalmic suspension Commonly known as:  Pace 1 drop into both eyes 3 (three) times daily as needed.   nitroGLYCERIN 0.4 MG SL tablet Commonly known as:  NITROSTAT Place 1 tablet (0.4 mg total) under the tongue every 5 (five) minutes x 3 doses as needed for chest pain.   OPTICHAMBER ADVANTAGE Misc 1 each by Other route once.   pregabalin 50  MG capsule Commonly known as:  LYRICA Take 50 mg by mouth 3 (three) times daily.   ticagrelor 90 MG Tabs tablet Commonly known as:  BRILINTA Take 1 tablet (90 mg total) by mouth 2 (two) times daily.   warfarin 2 MG tablet Commonly known as:  COUMADIN Take 4 mg by mouth at bedtime.         DISCHARGE INSTRUCTIONS:   DIET:  Cardiac diet and Diabetic diet  DISCHARGE CONDITION:  Stable  ACTIVITY:  Activity as tolerated  OXYGEN:  Home Oxygen: No.   Oxygen Delivery: room air  DISCHARGE LOCATION:  home   If you experience worsening of your admission symptoms, develop shortness of breath, life threatening emergency, suicidal or homicidal thoughts you must seek medical attention immediately by calling 911 or calling your MD immediately  if symptoms less severe.  You Must read complete instructions/literature along with all the possible adverse reactions/side effects for all the Medicines you take and that have been prescribed to you. Take any new Medicines after you have completely understood and accpet all the possible adverse reactions/side effects.   Please note  You were cared for by  a hospitalist during your hospital stay. If you have any questions about your discharge medications or the care you received while you were in the hospital after you are discharged, you can call the unit and asked to speak with the hospitalist on call if the hospitalist that took care of you is not available. Once you are discharged, your primary care physician will handle any further medical issues. Please note that NO REFILLS for any discharge medications will be authorized once you are discharged, as it is imperative that you return to your primary care physician (or establish a relationship with a primary care physician if you do not have one) for your aftercare needs so that they can reassess your need for medications and monitor your lab values.     Today   No acute events overnight. Chest pain-free. Family at bedside.  VITAL SIGNS:  Blood pressure 130/60, pulse (!) 59, temperature 97.6 F (36.4 C), temperature source Oral, resp. rate 16, height 5\' 3"  (1.6 m), weight 88 kg (194 lb), SpO2 97 %.  I/O:   Intake/Output Summary (Last 24 hours) at 09/20/16 1320 Last data filed at 09/20/16 1319  Gross per 24 hour  Intake              723 ml  Output              800 ml  Net              -77 ml    PHYSICAL EXAMINATION:  GENERAL:  83 y.o.-year-old patient sitting up in the bed in no acute distress.  EYES: Pupils equal, round, reactive to light and accommodation. No scleral icterus. Extraocular muscles intact.  HEENT: Head atraumatic, normocephalic. Oropharynx and nasopharynx clear.  NECK:  Supple, no jugular venous distention. No thyroid enlargement, no tenderness.  LUNGS: Normal breath sounds bilaterally, no wheezing, rales,rhonchi. No use of accessory muscles of respiration.  CARDIOVASCULAR: S1, S2 normal. No murmurs, rubs, or gallops.  ABDOMEN: Soft, non-tender, non-distended. Bowel sounds present. No organomegaly or mass.  EXTREMITIES: No pedal edema, cyanosis, or clubbing.   NEUROLOGIC: Cranial nerves II through XII are intact. No focal motor or sensory defecits b/l.  PSYCHIATRIC: The patient is alert and oriented x 3.  SKIN: No obvious rash, lesion, or ulcer.   DATA REVIEW:   CBC  Recent Labs Lab 09/20/16 0442  WBC 8.9  HGB 9.5*  HCT 28.2*  PLT 230    Chemistries   Recent Labs Lab 09/20/16 0442  NA 134*  K 4.5  CL 106  CO2 22  GLUCOSE 207*  BUN 29*  CREATININE 1.61*  CALCIUM 9.1    Cardiac Enzymes  Recent Labs Lab 09/20/16 0442  TROPONINI <0.03      RADIOLOGY:  Dg Chest 2 View  Result Date: 09/19/2016 CLINICAL DATA:  Chest pain and shortness of breath EXAM: CHEST  2 VIEW COMPARISON:  08/16/2016 FINDINGS: Normal heart size and stable mediastinal contours. Aortic atherosclerotic calcification. Borderline interstitial coarsening on AP view, but not seen laterally. There is no edema, consolidation, effusion, or pneumothorax. No acute osseous findings. IMPRESSION: No evidence of active disease. Electronically Signed   By: Monte Fantasia M.D.   On: 09/19/2016 13:51      Management plans discussed with the patient, family and they are in agreement.  CODE STATUS:     Code Status Orders        Start     Ordered   09/19/16 1700  Full code  Continuous     09/19/16 1700      TOTAL TIME TAKING CARE OF THIS PATIENT: 40 minutes.    Henreitta Leber M.D on 09/20/2016 at 1:20 PM  Between 7am to 6pm - Pager - [phone]  After 6pm go to [email or web address] - [employer] [facility]  Office  [phone]  CC: Primary care physician; Juanell Fairly, MD

## 2016-09-20 NOTE — Consult Note (Signed)
Cardiology Consultation Note  Patient ID: Lucas Caldwell, MRN: [id number], DOB/AGE: 04-01-1933 82 y.o. Admit date: 09/19/2016   Date of Consult: 09/20/2016 Primary Physician: Juanell Fairly, MD Primary Cardiologist: Dr. Saunders Revel, MD Requesting Physician: Dr. Posey Pronto, MD  Chief Complaint: Chest pain Reason for Consult: Same  HPI: Lucas Caldwell is a 83 y.o. male who is being seen today for the evaluation of chest pain at the request of Dr. Posey Pronto, MD. Patient has a h/o CAD as detailed below, mixed ischemic and nonischemic cardiomyopathy, PAF on Coumadin, CKD stage III, DM 2, hyperlipidemia, hypertension, CML, iron deficiency anemia, BPH, and hypothyroidism who presented to [facility] on 7/5 with a one-month history of chest pain that has been unchanged.  He has a history of nonischemic cardio myopathy dating back to the late 1990s. He was later diagnosed with CAD and underwent stenting 2 at [facility] in 2014. He appears to be followed closely at [facility]. Despite prior history of cardiomyopathy, he did have normalization of LV function in 2012 with an echo in April 2017 also showing an EF of 50-55%. Unfortunately, over the a 2 to three-month period earlier this year, he began expressing progressive dyspnea on exertion sometimes associated with elevated heart rates. He also experienced progressive exertional chest discomfort. Due to progressive symptoms, he presented to [facility] on May 28. Troponins were normal. Echo showed an EF of 45-50%. Due to progressive symptoms, once creatinine stabilized, we performed catheterization which showed moderate to severe left main disease of approximate 70% with severe right coronary artery disease. He was transferred to [facility] for surgical evaluation. He was not felt to be a strong candidate for bypass surgery given multiple comorbidities. As a result, he underwent stenting of the right coronary artery.  medical therapy was recommended for the left main although it could be  considered for stenting if he had worsening symptoms. An hospital follow-up he had not done very much though was working with physical therapy. He continued to note dyspnea on exertion though denied chest pain. He was continued on current medications with recommendation to stop aspirin on 7/4. His weight has been stable and he denied no abdominal distention or lower extremity swelling. He previously been on ACE inhibitor and eplerenone however these were previously discontinued during hospitalization in late May secondary to acute renal injury.  The patient has continued to experience substernal chest pain on an almost daily basis since his PCI on 08/19/16. Pain is not associated with exertion. There is some associated shortness of breath. He has not had any worsening of this pain. He reports he "just got tired of it." The pain typically lasts approximately 20-25 minutes and will resolve without intervention. No associated diaphoresis, nausea, vomiting, palpitations, dizziness, presyncope, or syncope. Pain is not as severe as pain that led to his cardiac catheter in May 2018. Upon his arrival to [facility] he was given sublingual nitroglycerin in the ED without improvement in his pain. He has not missed any doses of his Brilinta or warfarin. He did stop taking aspirin on 7/4 as previously directed. Troponin negative 4, serum creatinine 2.0 trending to 1.61, potassium 4.8, hemoglobin 10.0 trending to 9.5, INR 3.3 trending to 3.47. Chest x-ray without evidence of acute cardiopulmonary disease. EKG as below. Vital signs have been stable. Telemetry nonacute. Currently without chest pain.  Past Medical History:  Diagnosis Date  . Arthritis    "probably in his legs" (08/15/2016)  . Asthma   . Blind right eye   .  BPH (benign prostatic hyperplasia)   . Breast asymmetry    Left breast is larger, present for several years.  [name] CAD (coronary artery disease)    a. Cath in the late 90's - reportedly ok;  b. 2014 s/p  stenting x 2 @ [facility]; c 08/19/16 Cath/PCI with DES -> RCA, plan to treat LM 70% medically. Seen by surgery and felt to be too high risk for CABG.  . Chronic combined systolic (congestive) and diastolic (congestive) heart failure ([place])    a. Previously reduced EF-->50% by echo in 2012;  b. 06/2015 Echo: EF 50-55%  c. 07/2016 Echo: EF 45-50%.  . CKD (chronic kidney disease), stage III   . CML (chronic myelocytic leukemia) ([place])   . GERD (gastroesophageal reflux disease)   . Gout   . Hyperlipemia   . Hypertension   . Hypothyroidism   . Iron deficiency anemia   . Migraine    "in the 1960s" (08/15/2016)  . NICM (nonischemic cardiomyopathy) ([place])    a. Previously reduced EF-->50% by echo in 2012;  b. 06/2015 Echo: EF 50-55%, Gr2 DD, mild MR, mildly dil LA, Ao sclerosis, mild TR;  c. 07/2016 Echo: EF 45-50%, ? inf HK, mild MR, mildly dil LA.  [name] PAF (paroxysmal atrial fibrillation) (HCC)    a. ? Dx 2014-->s/p DCCV;  b. CHA2DS2VASc = 6-->chronic coumadin.  [name] RBBB   . Type II diabetes mellitus ([place])       Most Recent Cardiac Studies: LHC 08/14/16: Conclusion   Conclusions: 1. Significant multivessel coronary artery disease, including 70% ostial LMCA stenosis (with catheter dampening) and 95% proximal/mid RCA lesion. 2. Patent stents in the mid LCx and mid RCA. 3. Upper normal left ventricular filling pressure.  Recommendations: 1. Given LMCA and RCA disease in the setting of diabetes mellitus, surgical consultation for CABG should be considered. However, if comorbidities preclude surgical intervention, PCI to unprotected LMCA and mid RCA could be considered. 2. Medical therapy, including gentle hydration, given chronic kidney disease. 3. Aggressive secondary prevention.   PCI 08/19/2016: Conclusion   Conclusions: 1. Moderate to severe proximal/mid RCA disease with focal 95% stenosis at proximal margin of previously placed mid-RCA stent. 2. Mild in-stent restenosis of mid RCA stent. 3. Successful  PCI to proximal/mid RCA with placement of a Synergy 3.0 x 28 mm drug-eluting stent with 0% residual stenosis and TIMI-3 flow.  Recommendations: 1. Restart warfarin per pharmacy. Anticipate triple therapy with ASA, ticagrelor, and warfarin x 1 month, following by warfarin and ticagrelor. 2. Aggressive secondary prevention. 3. Medical management of non-critical LMCA stenosis. If chest pain and shortness of breath persist, high-risk PCI to the ostial LMCA will need to be considered.   TTE 08/12/16: Study Conclusions  - Left ventricle: The cavity size was mildly dilated. There was   mild concentric hypertrophy. Systolic function was mildly   reduced. The estimated ejection fraction was in the range of 45%   to 50%. Possible hypokinesis of the inferior myocardium. Left   ventricular diastolic function parameters were normal. - Mitral valve: There was mild regurgitation. - Left atrium: The atrium was mildly dilated. - Pulmonary arteries: Systolic pressure could not be accurately   estimated.   Surgical History:  Past Surgical History:  Procedure Laterality Date  . CATARACT EXTRACTION W/ INTRAOCULAR LENS  IMPLANT, BILATERAL Bilateral   . CORONARY ANGIOPLASTY WITH STENT PLACEMENT  09/2012   2 stents  . CORONARY STENT INTERVENTION N/A 08/19/2016   Procedure: Coronary Stent Intervention;  Surgeon: [name],  MD;  Location: [place] [facility];  Service: Cardiovascular;  Laterality: N/A;  . HERNIA REPAIR     "navel"  . LEFT HEART CATH AND CORONARY ANGIOGRAPHY N/A 08/14/2016   Procedure: Left Heart Cath and Coronary Angiography;  Surgeon: [name], MD;  Location: [place] [facility];  Service: Cardiovascular;  Laterality: N/A;  . TOE AMPUTATION Right    "big toe"     Home Meds: Prior to Admission medications   Medication Sig Start Date End Date Taking? Authorizing Provider  acetaminophen (TYLENOL) 500 MG tablet Take 500 mg by mouth every 6 (six) hours as needed.   Yes  [provider]  albuterol (PROVENTIL HFA;VENTOLIN HFA) 108 (90 BASE) MCG/ACT inhaler Inhale 2 puffs into the lungs every 6 (six) hours as needed for wheezing or shortness of breath. 11/04/14  Yes Ahmed Prima, MD  allopurinol (ZYLOPRIM) 300 MG tablet Take 300 mg by mouth daily.  11/10/15  Yes [provider]  atorvastatin (LIPITOR) 80 MG tablet Take 1 tablet (80 mg total) by mouth daily at 6 PM. 08/20/16  Yes Reino Bellis B, NP  bosutinib (BOSULIF) 100 MG tablet Take 200 mg by mouth daily with supper. Take with food.   Yes [provider]  digoxin (LANOXIN) 0.125 MG tablet Take 125 mcg by mouth daily.  11/10/15  Yes [provider]  famotidine (PEPCID) 20 MG tablet Take 20 mg by mouth at bedtime.   Yes [provider]  fenofibrate 160 MG tablet Take 1 tablet (160 mg total) by mouth daily. 08/21/16  Yes Reino Bellis B, NP  hydrocortisone 2.5 % ointment Apply topically 3 (three) times daily. 07/20/16  Yes Triplett, Cari B, FNP  LANTUS SOLOSTAR 100 UNIT/ML Solostar Pen Inject 20 Units into the skin at bedtime. Patient taking differently: Inject 50 Units into the skin at bedtime.  12/11/15  Yes Demetrios Loll, MD  levothyroxine (SYNTHROID, LEVOTHROID) 25 MCG tablet Take 25 mcg by mouth daily before breakfast.  10/27/15  Yes [provider]  loperamide (IMODIUM A-D) 2 MG tablet Take 2 mg by mouth 3 (three) times daily as needed for diarrhea or loose stools.    Yes [provider]  magnesium oxide (MAG-OX) 400 MG tablet Take 400 mg by mouth 2 (two) times daily.   Yes [provider]  metoprolol succinate (TOPROL-XL) 50 MG 24 hr tablet Take 50 mg by mouth every morning. 12/04/15  Yes [provider]  mirtazapine (REMERON) 15 MG tablet Take 15 mg by mouth at bedtime. 11/02/15  Yes [provider]  Multiple Vitamins-Minerals (MULTIVITAMIN ADULTS 50+ PO) Take 1 tablet by mouth every morning.   Yes [provider]    nepafenac (NEVANAC) 0.1 % ophthalmic suspension Place 1 drop into both eyes 3 (three) times daily as needed.   Yes [provider]  nitroGLYCERIN (NITROSTAT) 0.4 MG SL tablet Place 1 tablet (0.4 mg total) under the tongue every 5 (five) minutes x 3 doses as needed for chest pain. 08/20/16  Yes Reino Bellis B, NP  pregabalin (LYRICA) 50 MG capsule Take 50 mg by mouth 3 (three) times daily.   Yes [provider]  ticagrelor (BRILINTA) 90 MG TABS tablet Take 1 tablet (90 mg total) by mouth 2 (two) times daily. 08/20/16  Yes Reino Bellis B, NP  warfarin (COUMADIN) 2 MG tablet Take 4 mg by mouth at bedtime.   Yes [provider]  Spacer/Aero-Holding [facility] ADVANTAGE) MISC 1 each by Other route once.  11/04/14   Ahmed Prima, MD    Inpatient Medications:  . allopurinol  300 mg Oral Daily  . atorvastatin  80 mg Oral q1800  . digoxin  125 mcg Oral Daily  . famotidine  20 mg Oral QHS  . fenofibrate  160 mg Oral Daily  . insulin aspart  0-9 Units Subcutaneous TID WC  . insulin glargine  20 Units Subcutaneous QHS  . levothyroxine  25 mcg Oral QAC breakfast  . magnesium oxide  400 mg Oral BID  . metoprolol succinate  50 mg Oral Daily  . mirtazapine  15 mg Oral QHS  . multivitamin with minerals  1 tablet Oral BH-q7a  . pregabalin  50 mg Oral TID  . sodium chloride flush  3 mL Intravenous Q12H  . ticagrelor  90 mg Oral BID   . sodium chloride      Allergies:  Allergies  Allergen Reactions  . Clopidogrel Anaphylaxis    Other reaction(s): ANAPHYLAXIS;altered mental status;  (electrolytes "out of wack" and confusion per family; THEY DO NOT REMEMBER ANY OTHER REACTION)- MSB 10/10/15  . Ciprofloxacin Itching  . Lyrica [Pregabalin] Itching  . Spironolactone     Other reaction(s): Other (See Comments) gynecomastia  . Plavix [Clopidogrel Bisulfate] Anxiety    Social History   Social History  . Marital status: Married    Spouse name: N/A  .  Number of children: N/A  . Years of education: N/A   Occupational History  . 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   Social History Main Topics  . Smoking status: Former Smoker    Packs/day: 2.00    Years: 15.00    Types: Cigarettes  . Smokeless tobacco: Never Used     Comment: quit 1979  . Alcohol use No     Comment: previously drank heavily - quit 1979.  . Drug use: No  . Sexual activity: No   Other Topics Concern  . Not on file   Social History Narrative  . No narrative on file     Family History  Problem Relation Age of Onset  . Brain cancer Father   . Diabetes Sister   . Heart attack Sister      Review of Systems: Review of Systems  Constitutional: Positive for malaise/fatigue. Negative for chills, diaphoresis, fever and weight loss.  HENT: Negative for congestion.   Eyes: Negative for discharge and redness.  Respiratory: Positive for shortness of breath. Negative for cough, hemoptysis, sputum production and wheezing.   Cardiovascular: Positive for chest pain. Negative for palpitations, orthopnea, claudication, leg swelling and PND.  Gastrointestinal: Negative for abdominal pain, blood in stool, heartburn, melena, nausea and vomiting.  Genitourinary: Negative for hematuria.  Musculoskeletal: Negative for falls and myalgias.  Skin: Negative for rash.  Neurological: Negative for dizziness, tingling, tremors, sensory change, speech change, focal weakness, loss of consciousness and weakness.  Endo/Heme/Allergies: Does not bruise/bleed easily.  Psychiatric/Behavioral: Negative for substance abuse. The patient is not nervous/anxious.   All other systems reviewed and are negative.   Labs:  Recent Labs  09/19/16 1320 09/19/16 1729 09/19/16 2314 09/20/16 0442  TROPONINI <0.03 <0.03 <0.03 <0.03   Lab Results  Component Value Date   WBC 8.9 09/20/2016   HGB 9.5 (L) 09/20/2016   HCT 28.2 (L) 09/20/2016   MCV 79.5 (L) 09/20/2016   PLT 230 09/20/2016       Recent Labs Lab 09/20/16 0442  NA 134*  K 4.5  CL 106  CO2 22  BUN 29*  CREATININE 1.61*  CALCIUM 9.1  GLUCOSE 207*   Lab Results  Component Value Date   CHOL 107 08/16/2016   HDL 24 (L) 08/16/2016   LDLCALC 15 08/16/2016   TRIG 338 (H) 08/16/2016   No results found for: DDIMER  Radiology/Studies:  Dg Chest 2 View  Result Date: 09/19/2016 CLINICAL DATA:  Chest pain and shortness of breath EXAM: CHEST  2 VIEW COMPARISON:  08/16/2016 FINDINGS: Normal heart size and stable mediastinal contours. Aortic atherosclerotic calcification. Borderline interstitial coarsening on AP view, but not seen laterally. There is no edema, consolidation, effusion, or pneumothorax. No acute osseous findings. IMPRESSION: No evidence of active disease. Electronically Signed   By: Monte Fantasia M.D.   On: 09/19/2016 13:51    EKG: Interpreted by me showed: NSRm 61 bpm, RBBB, LAFB, poor R wave progression Telemetry: Interpreted by me showed: NSR, RBBB, occasional PVCs  Weights: Filed Weights   09/19/16 1315  Weight: 194 lb (88 kg)     Physical Exam: Blood pressure (!) 132/50, pulse 69, temperature 98.5 F (36.9 C), temperature source Oral, resp. rate 18, height 5\' 3"  (1.6 m), weight 194 lb (88 kg), SpO2 99 %. Body mass index is 34.37 kg/m. General: Well developed, well nourished, in no acute distress. Head: Normocephalic, atraumatic, sclera non-icteric, no xanthomas, nares are without discharge.  Neck: Negative for carotid bruits. JVD not elevated. Lungs: Clear bilaterally to auscultation without wheezes, rales, or rhonchi. Breathing is unlabored. Heart: RRR with S1 S2. No murmurs, rubs, or gallops appreciated. Abdomen: Soft, non-tender, non-distended with normoactive bowel sounds. No hepatomegaly. No rebound/guarding. No obvious abdominal masses. Msk:  Strength and tone appear normal for age. Extremities: No clubbing or cyanosis. No edema. Distal pedal pulses are 2+ and equal  bilaterally. Neuro: Alert and oriented X 3. No facial asymmetry. No focal deficit. Moves all extremities spontaneously. Psych:  Responds to questions appropriately with a normal affect.    Assessment and Plan:  Principal Problem:   Stable angina (HCC) Active Problems:   CAD in native artery   CKD (chronic kidney disease), stage III   CML (chronic myelocytic leukemia) (HCC)   PAF (paroxysmal atrial fibrillation) ([place])    1. Stable angina/CAD as above: -Currently chest pain-free -Has known multivessel CAD as above with 70% left main stenosis -Given patient is currently chest pain-free and his medications have not been optimized we will add isosorbide mononitrate 30 mg daily with titration as needed in an effort to improve his stable angina. Could consider addition of her Nolamine 500 mg twice a day symptoms persist -After optimization of medications should he continue to remain symptomatically could consider high-risk PCI of the left main at [facility] -Has appropriately discontinued aspirin on 7/4 given he is on warfarin for his PAF -Continue Brilinta and warfarin -Continue Toprol-XL and Lipitor -Will need to continue with cardiac rehabilitation as an outpatient  2. PAF: -Currently in sinus rhythm with heart rates in the upper 50s to 60s bpm -Hold metoprolol for heart rate less than 60 bpm -Warfarin as above -INR supratherapeutic this morning, per pharmacy -CHADS2VASc at least 6 (CHF, HTN, age x 2, DM, vascular disease) -Check digoxin level -Digoxin on hold given AKI  3. Chronic combined systolic and diastolic YNW/29 ischemic and nonischemic cardiomyopathy: -He does not appear grossly volume overloaded at this time -Recent echo with EF 45-50% in late May 2018 -Continue Toprol-XL as above -Previously on ACE inhibitor and eplerenone though these were  discontinued in late May secondary to a KI -Add Imdur as above -Will also add hydralazine given his  cardiomyopathy and AK I which precludes ACEi/spironolactone at this time -Daily weights and strict I's and O's -No indication to repeat echo at this time  4. Acute on CKD stage III: -Renal function improved this morning -Monitor  5. Essential hypertension: -Controlled -Continue current medications as above  6. HLD: -Lipitor    Signed, [name], PA-C [facility] Pager: [phone] 09/20/2016, 8:25 AM

## 2016-09-24 ENCOUNTER — Other Ambulatory Visit: Payer: Self-pay | Admitting: *Deleted

## 2016-09-24 MED ORDER — AMOXICILLIN 875 MG TABLET
ORAL_TABLET | Freq: Two times a day (BID) | ORAL | 0 refills | 0.00000 days | Status: CP
Start: 2016-09-24 — End: 2016-10-04

## 2016-09-24 NOTE — Telephone Encounter (Signed)
Pt has been seen by [name] OV, medications requested not originally prescribed by our office. Please advise if ok to refill?

## 2016-09-25 ENCOUNTER — Other Ambulatory Visit: Payer: Self-pay | Admitting: *Deleted

## 2016-09-25 ENCOUNTER — Telehealth: Payer: Self-pay | Admitting: Internal Medicine

## 2016-09-25 ENCOUNTER — Ambulatory Visit (INDEPENDENT_AMBULATORY_CARE_PROVIDER_SITE_OTHER): Payer: Medicare HMO | Admitting: *Deleted

## 2016-09-25 DIAGNOSIS — Z7901 Long term (current) use of anticoagulants: Secondary | ICD-10-CM

## 2016-09-25 DIAGNOSIS — I480 Paroxysmal atrial fibrillation: Secondary | ICD-10-CM | POA: Diagnosis not present

## 2016-09-25 LAB — POCT INR: INR: 3.1

## 2016-09-25 MED ORDER — TICAGRELOR 90 MG PO TABS: 90.0000 mg | ORAL_TABLET | Freq: Two times a day (BID) | ORAL | 3 refills | Status: DC

## 2016-09-25 MED ORDER — NITROGLYCERIN 0.4 MG SL SUBL: 0.4000 mg | SUBLINGUAL_TABLET | SUBLINGUAL | 0 refills | Status: DC | PRN

## 2016-09-25 NOTE — Telephone Encounter (Signed)
I suspect Lucas Caldwell' recent hospitalizations have more to do with his confusion than the ticagrelor. Given his history of clopidogrel allergy, there is no good alternative for this medication. I recommend that he and his wife speak with Lucas Caldwell' PCP. Thanks.  [name], MD [facility] Pager: [phone]

## 2016-09-25 NOTE — Telephone Encounter (Signed)
Returned call to patient and wife. Patient gave verbal permission for me to speak with wife. Asked for them to fill out DPR form on next visit to our office.  Patient stated he is confused at certain points throughout each day.  This morning he remembers waking up and seeing people from his past and talking to them.  Wife states he is confused in the evening and morning and typically better through the day. Wife notes some confusion prior to starting Brilinta but she feels it is much worse now. Patient was very confused in the past when on Plavix. Patient had heart cath on 08/14/16 and CSI on 08/19/16 by Dr End. Advised patient should continue Brilinta and that I would route to Dr End for advice.  Suggested that depending on response from Dr End, patient may need to contact PCP for the confusion/dementia.

## 2016-09-25 NOTE — Telephone Encounter (Signed)
Pt wife calling stating patient may need to be placed on another medication  He was placed on Brilinta and it is making him to confused He woke up today thinking he was in another house  Please advise.

## 2016-09-26 MED ORDER — NITROGLYCERIN 0.4 MG SL SUBL: 0.4000 mg | SUBLINGUAL_TABLET | SUBLINGUAL | 1 refills | Status: DC | PRN

## 2016-09-26 MED ORDER — TICAGRELOR 90 MG PO TABS: 90.0000 mg | ORAL_TABLET | Freq: Two times a day (BID) | ORAL | 3 refills | Status: DC

## 2016-09-26 NOTE — Telephone Encounter (Signed)
S/w patient's wife. She verbalized understanding of Dr Darnelle Bos recommendations. She will contact his PCP to discuss the confusion.

## 2016-09-27 ENCOUNTER — Ambulatory Visit
Admission: RE | Admit: 2016-09-27 | Discharge: 2016-09-27 | Payer: MEDICARE | Attending: Pediatrics | Admitting: Pediatrics

## 2016-09-27 DIAGNOSIS — I10 Essential (primary) hypertension: Principal | ICD-10-CM

## 2016-09-27 DIAGNOSIS — I5022 Chronic systolic (congestive) heart failure: Secondary | ICD-10-CM

## 2016-09-27 DIAGNOSIS — L409 Psoriasis, unspecified: Secondary | ICD-10-CM

## 2016-09-27 DIAGNOSIS — I2510 Atherosclerotic heart disease of native coronary artery without angina pectoris: Secondary | ICD-10-CM

## 2016-09-27 MED ORDER — ISOSORBIDE MONONITRATE ER 30 MG TABLET,EXTENDED RELEASE 24 HR
ORAL_TABLET | Freq: Every day | ORAL | 11 refills | 0.00000 days
Start: 2016-09-27 — End: 2016-11-17

## 2016-09-27 MED ORDER — TRIAMCINOLONE ACETONIDE 0.1 % TOPICAL CREAM
Freq: Two times a day (BID) | TOPICAL | 0 refills | 0.00000 days | Status: CP
Start: 2016-09-27 — End: 2017-05-14

## 2016-10-02 MED ORDER — TICAGRELOR 90 MG PO TABS: 90.0000 mg | ORAL_TABLET | Freq: Two times a day (BID) | ORAL | 3 refills | Status: DC

## 2016-10-02 MED ORDER — NITROGLYCERIN 0.4 MG SL SUBL: 0.4000 mg | SUBLINGUAL_TABLET | SUBLINGUAL | 2 refills | Status: DC | PRN

## 2016-10-02 NOTE — Addendum Note (Signed)
Addended by: Derl Barrow on: 10/02/2016 08:31 AM   Modules accepted: Orders

## 2016-10-02 NOTE — Telephone Encounter (Signed)
Mail order Pharmacy requesting a 90 day supply. Medications were resent into pharmacy as a 90 day supply. Confirmation received.

## 2016-10-04 ENCOUNTER — Encounter: Payer: Medicare HMO | Admitting: Podiatry

## 2016-10-09 ENCOUNTER — Ambulatory Visit (INDEPENDENT_AMBULATORY_CARE_PROVIDER_SITE_OTHER): Payer: Medicare HMO

## 2016-10-09 DIAGNOSIS — I480 Paroxysmal atrial fibrillation: Secondary | ICD-10-CM | POA: Diagnosis not present

## 2016-10-09 DIAGNOSIS — Z7901 Long term (current) use of anticoagulants: Secondary | ICD-10-CM | POA: Diagnosis not present

## 2016-10-09 LAB — POCT INR: INR: 2.7

## 2016-10-10 ENCOUNTER — Ambulatory Visit
Admission: RE | Admit: 2016-10-10 | Discharge: 2016-10-10 | Disposition: A | Discharge status: Home / Self Care | Payer: MEDICARE

## 2016-10-10 ENCOUNTER — Ambulatory Visit
Admission: RE | Admit: 2016-10-10 | Discharge: 2016-10-10 | Disposition: A | Discharge status: Home / Self Care | Payer: MEDICARE | Attending: Nurse Practitioner | Admitting: Nurse Practitioner

## 2016-10-10 ENCOUNTER — Ambulatory Visit
Admission: RE | Admit: 2016-10-10 | Discharge: 2016-10-10 | Disposition: A | Discharge status: Home / Self Care | Payer: MEDICARE | Attending: Oncology | Admitting: Oncology

## 2016-10-10 DIAGNOSIS — C9210 Chronic myeloid leukemia, BCR/ABL-positive, not having achieved remission: Principal | ICD-10-CM

## 2016-10-10 LAB — CBC W/ AUTO DIFF
BASOPHILS ABSOLUTE COUNT: 0.1 10*9/L (ref 0.0–0.1)
EOSINOPHILS ABSOLUTE COUNT: 0.2 10*9/L (ref 0.0–0.4)
HEMATOCRIT: 28.4 % — ABNORMAL LOW (ref 41.0–53.0)
HEMOGLOBIN: 9.2 g/dL — ABNORMAL LOW (ref 13.5–17.5)
LARGE UNSTAINED CELLS: 3 % (ref 0–4)
LYMPHOCYTES ABSOLUTE COUNT: 1.3 10*9/L — ABNORMAL LOW (ref 1.5–5.0)
MEAN CORPUSCULAR HEMOGLOBIN CONC: 32.4 g/dL (ref 31.0–37.0)
MEAN CORPUSCULAR VOLUME: 81.6 fL (ref 80.0–100.0)
MEAN PLATELET VOLUME: 7.8 fL (ref 7.0–10.0)
MONOCYTES ABSOLUTE COUNT: 0.5 10*9/L (ref 0.2–0.8)
NEUTROPHILS ABSOLUTE COUNT: 4.7 10*9/L (ref 2.0–7.5)
PLATELET COUNT: 269 10*9/L (ref 150–440)
RED BLOOD CELL COUNT: 3.48 10*12/L — ABNORMAL LOW (ref 4.50–5.90)
RED CELL DISTRIBUTION WIDTH: 16.2 % — ABNORMAL HIGH (ref 12.0–15.0)
WBC ADJUSTED: 6.8 10*9/L (ref 4.5–11.0)

## 2016-10-10 LAB — COMPREHENSIVE METABOLIC PANEL
ALBUMIN: 3.9 g/dL (ref 3.5–5.0)
ALKALINE PHOSPHATASE: 98 U/L (ref 38–126)
ANION GAP: 11 mmol/L (ref 9–15)
AST (SGOT): 34 U/L (ref 19–55)
BILIRUBIN TOTAL: 0.3 mg/dL (ref 0.0–1.2)
BLOOD UREA NITROGEN: 20 mg/dL (ref 7–21)
BUN / CREAT RATIO: 12
CALCIUM: 9.2 mg/dL (ref 8.5–10.2)
CHLORIDE: 100 mmol/L (ref 98–107)
CO2: 23 mmol/L (ref 22.0–30.0)
CREATININE: 1.68 mg/dL — ABNORMAL HIGH (ref 0.70–1.30)
EGFR MDRD AF AMER: 48 mL/min/{1.73_m2} — ABNORMAL LOW (ref >=60)
GLUCOSE RANDOM: 247 mg/dL — ABNORMAL HIGH (ref 65–179)
POTASSIUM: 4.9 mmol/L (ref 3.5–5.0)
PROTEIN TOTAL: 7.2 g/dL (ref 6.5–8.3)
SODIUM: 134 mmol/L — ABNORMAL LOW (ref 135–145)

## 2016-10-10 LAB — BASOPHILS ABSOLUTE COUNT: Lab: 0.1

## 2016-10-10 LAB — SLIDE REVIEW

## 2016-10-10 LAB — ALBUMIN: Albumin:MCnc:Pt:Ser/Plas:Qn:: 3.9

## 2016-10-10 LAB — SMEAR REVIEW

## 2016-10-10 MED ORDER — BOSULIF 100 MG TABLET
ORAL_TABLET | PRN refills | 0 days | Status: CP
Start: 2016-10-10 — End: 2017-05-14

## 2016-10-10 NOTE — Unmapped (Signed)
Chemo - Refill request

## 2016-10-10 NOTE — Unmapped (Addendum)
It was nice to see you.    I'll call you with the results of your leukemia test.    Try Mucinex DM to help with your cough.      If you start to feel worse or get a fever, go to the doctor.    Lab on 10/10/2016   Component Date Value Ref Range Status   ??? Sodium 10/10/2016 134* 135 - 145 mmol/L Final   ??? Potassium 10/10/2016 4.9  3.5 - 5.0 mmol/L Final   ??? Chloride 10/10/2016 100  98 - 107 mmol/L Final   ??? CO2 10/10/2016 23.0  22.0 - 30.0 mmol/L Final   ??? BUN 10/10/2016 20  7 - 21 mg/dL Final   ??? Creatinine 10/10/2016 1.68* 0.70 - 1.30 mg/dL Final   ??? BUN/Creatinine Ratio 10/10/2016 12   Final   ??? EGFR MDRD Non Af Amer 10/10/2016 40* >=60 mL/min/1.85m2 Final   ??? EGFR MDRD Af Amer 10/10/2016 48* >=60 mL/min/1.31m2 Final   ??? Anion Gap 10/10/2016 11  9 - 15 mmol/L Final   ??? Glucose 10/10/2016 247* 65 - 179 mg/dL Final   ??? Calcium 16/12/9602 9.2  8.5 - 10.2 mg/dL Final   ??? Albumin 54/11/8117 3.9  3.5 - 5.0 g/dL Final   ??? Total Protein 10/10/2016 7.2  6.5 - 8.3 g/dL Final   ??? Total Bilirubin 10/10/2016 0.3  0.0 - 1.2 mg/dL Final   ??? AST 14/78/2956 34  19 - 55 U/L Final   ??? ALT 10/10/2016 26  19 - 72 U/L Final   ??? Alkaline Phosphatase 10/10/2016 98  38 - 126 U/L Final   ??? WBC 10/10/2016 6.8  4.5 - 11.0 10*9/L Final   ??? RBC 10/10/2016 3.48* 4.50 - 5.90 10*12/L Final   ??? HGB 10/10/2016 9.2* 13.5 - 17.5 g/dL Final   ??? HCT 21/30/8657 28.4* 41.0 - 53.0 % Final   ??? MCV 10/10/2016 81.6  80.0 - 100.0 fL Final   ??? MCH 10/10/2016 26.4  26.0 - 34.0 pg Final   ??? MCHC 10/10/2016 32.4  31.0 - 37.0 g/dL Final   ??? RDW 84/69/6295 16.2* 12.0 - 15.0 % Final   ??? MPV 10/10/2016 7.8  7.0 - 10.0 fL Final   ??? Platelet 10/10/2016 269  150 - 440 10*9/L Final   ??? Absolute Neutrophils 10/10/2016 4.7  2.0 - 7.5 10*9/L Final   ??? Absolute Lymphocytes 10/10/2016 1.3* 1.5 - 5.0 10*9/L Final   ??? Absolute Monocytes 10/10/2016 0.5  0.2 - 0.8 10*9/L Final   ??? Absolute Eosinophils 10/10/2016 0.2  0.0 - 0.4 10*9/L Final   ??? Absolute Basophils 10/10/2016 0.1 0.0 - 0.1 10*9/L Final   ??? Large Unstained Cells 10/10/2016 3  0 - 4 % Final   ??? Microcytosis 10/10/2016 Slight* Not Present Final   ??? Anisocytosis 10/10/2016 Slight* Not Present Final   ??? Hypochromasia 10/10/2016 Marked* Not Present Final

## 2016-10-10 NOTE — Unmapped (Signed)
[name] is a 83 y.o. 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  Presenting WBC- 41.9    PCR: BCR-ABL p210 59% IS   Cytogenetics: t(9;22)    Started Imatinib 11/13/2012    3 month PCR 0.184% (02/22/13)  6 month PCR 0.091% (06/15/13)  9 month PCR 0.016% (08/13/13)  12 month PCR 0.02% (11/2013)  04/29/14: PCR 0.003%  Imatinib held 04/29/2014 for GI complaints and increased SOB  Imatinib 400 mg every other day- 05/06/2014  Imatinib 400 mg daily- 05/27/2014  Imatinib 400 mg every other day- 06/02/2014 due to significant diarrhea  Imatinib 400 mg daily- 06/10/2014    PCR: 07/2014: 0.007%  PCR: 9/16 - 0.002%  PCR 02/24/15: 0.001%  PCR 04/21/15: 0.001%  07/12/15: 0.002%  10/13/15: 0.001%    12/19/15: Imatinib held d/t significant malaise/fatigue    01/08/16: Will restart imatinib at 200 mg daily once he receives shipment    01/15/16: Started bosutinib 100 mg daily  01/23/16: Increase bosutinib to 200 mg daily    03/2016: 0.002%        CML (chronic myelocytic leukemia) (CMS-HCC)    09/26/2011 Initial Diagnosis     CML (chronic myelocytic leukemia)         11/13/2012 -  Chemotherapy     Imatinib         04/29/2014 Adverse Reaction     Gleevec held d/t GI complaints, increased SOB, increased swelling         05/06/2014 -  Chemotherapy     Restart imatinib at 400 mg every other day         05/27/2014 -  Chemotherapy     Increase imatinib to 400 mg daily         06/02/2014 Adverse Reaction     Reduce imatinib to 400 mg every other day d/t significant diarrhea         06/10/2014 -  Chemotherapy     Restart imatinib 400 mg daily         04/21/2015 -  Chemotherapy     Dose reduced imatinib to 400 mg every other day d/t interaction with increased dose of eplerenone and evidence of dehydration/increased gleevec toxicity.         04/28/2015 -  Chemotherapy     Resumed imatinib 400 mg daily         12/19/2015 -  Chemotherapy     Imatinib held d/t significant malaise/fatigue.         01/15/2016 -  Chemotherapy     Started on bosutinib 100 mg daily         01/23/2016 -  Chemotherapy     Bosutinib increased to 200 mg daily            Weight and Vitals:  Wt Readings from Last 3 Encounters:   10/10/16 92.1 kg (203 lb)   09/27/16 90.5 kg (199 lb 9.6 oz)   09/10/16 92 kg (202 lb 12.8 oz)     Temp Readings from Last 3 Encounters:  10/10/16 36.6 ??C (97.9 ??F) (Oral)   09/27/16 36.6 ??C (97.8 ??F) (Oral)   08/06/16 36.7 ??C (98 ??F) (Oral)     BP Readings from Last 3 Encounters:   10/10/16 124/60   09/27/16 128/58   09/10/16 128/60     Pulse Readings from Last 3 Encounters:   10/10/16 54   09/27/16 60   09/10/16 60       Pertinent Labs:  Lab on 10/10/2016   Component Date Value Ref Range Status   ??? Sodium 10/10/2016 134* 135 - 145 mmol/L Final   ??? Potassium 10/10/2016 4.9  3.5 - 5.0 mmol/L Final   ??? Chloride 10/10/2016 100  98 - 107 mmol/L Final   ??? CO2 10/10/2016 23.0  22.0 - 30.0 mmol/L Final   ??? BUN 10/10/2016 20  7 - 21 mg/dL Final   ??? Creatinine 10/10/2016 1.68* 0.70 - 1.30 mg/dL Final   ??? BUN/Creatinine Ratio 10/10/2016 12   Final   ??? EGFR MDRD Non Af Amer 10/10/2016 40* >=60 mL/min/1.94m2 Final   ??? EGFR MDRD Af Amer 10/10/2016 48* >=60 mL/min/1.39m2 Final   ??? Anion Gap 10/10/2016 11  9 - 15 mmol/L Final   ??? Glucose 10/10/2016 247* 65 - 179 mg/dL Final   ??? Calcium 16/12/9602 9.2  8.5 - 10.2 mg/dL Final   ??? Albumin 54/11/8117 3.9  3.5 - 5.0 g/dL Final   ??? Total Protein 10/10/2016 7.2  6.5 - 8.3 g/dL Final   ??? Total Bilirubin 10/10/2016 0.3  0.0 - 1.2 mg/dL Final   ??? AST 14/78/2956 34  19 - 55 U/L Final   ??? ALT 10/10/2016 26  19 - 72 U/L Final   ??? Alkaline Phosphatase 10/10/2016 98  38 - 126 U/L Final   ??? WBC 10/10/2016 6.8  4.5 - 11.0 10*9/L Final   ??? RBC 10/10/2016 3.48* 4.50 - 5.90 10*12/L Final   ??? HGB 10/10/2016 9.2* 13.5 - 17.5 g/dL Final   ??? HCT 21/30/8657 28.4* 41.0 - 53.0 % Final   ??? MCV 10/10/2016 81.6  80.0 - 100.0 fL Final   ??? MCH 10/10/2016 26.4  26.0 - 34.0 pg Final   ??? MCHC 10/10/2016 32.4  31.0 - 37.0 g/dL Final   ??? RDW 84/69/6295 16.2* 12.0 - 15.0 % Final   ??? MPV 10/10/2016 7.8  7.0 - 10.0 fL Final   ??? Platelet 10/10/2016 269  150 - 440 10*9/L Final   ??? Absolute Neutrophils 10/10/2016 4.7  2.0 - 7.5 10*9/L Final   ??? Absolute Lymphocytes 10/10/2016 1.3* 1.5 - 5.0 10*9/L Final   ??? Absolute Monocytes 10/10/2016 0.5  0.2 - 0.8 10*9/L Final   ??? Absolute Eosinophils 10/10/2016 0.2  0.0 - 0.4 10*9/L Final   ??? Absolute Basophils 10/10/2016 0.1  0.0 - 0.1 10*9/L Final   ??? Large Unstained Cells 10/10/2016 3  0 - 4 % Final   ??? Microcytosis 10/10/2016 Slight* Not Present Final   ??? Anisocytosis 10/10/2016 Slight* Not Present Final   ??? Hypochromasia 10/10/2016 Marked* Not Present Final       Current Medications:   Current Outpatient Prescriptions   Medication Sig Dispense Refill   ??? acetaminophen (TYLENOL) 500 MG tablet Take 500 mg by mouth daily as needed for pain (In the morning for pain in feet).     ??? atorvastatin (LIPITOR) 80 MG tablet Take 1 tablet (80 mg total) by mouth daily. 90 tablet 3   ??? blood sugar diagnostic (TRUETRACK TEST) Strp Use three  times daily to monitor blood sugar. (Dx: IDDM, ICD-9: 250.01) 100 each 12   ??? bosutinib 100 mg Tab Take 200 mg by mouth daily.      ??? digoxin (LANOXIN) 125 mcg tablet Take 1 tablet (125 mcg total) by mouth daily. 30 tablet 12   ??? enalapril (VASOTEC) 5 MG tablet Take 1 tablet (5 mg total) by mouth daily. 60 tablet 11   ??? famotidine (PEPCID) 20 MG tablet Take 1 tablet (20 mg total) by mouth nightly as needed for heartburn. Take 2 hours after chemo pill. 180 tablet 3   ??? fenofibrate (LOFIBRA) 160 MG tablet Take 1 tablet (160 mg total) by mouth daily. 90 tablet 3   ??? furosemide (LASIX) 20 MG tablet Take 20 mg by mouth daily.     ??? GLYCINE CARBONATE SODIUM (EFFERVESCENT MISC) Denture tabs     ??? IMODIUM A-D 2 mg capsule Take 2 mg by mouth 4 (four) times a day as needed.      ??? insulin glargine (LANTUS SOLOSTAR U-100 INSULIN) 100 unit/mL (3 mL) injection pen Inject 0.5 mL (50 Units total) under the skin nightly. Adjust as directed. 45 mL 3   ??? isosorbide mononitrate (IMDUR) 30 MG 24 hr tablet Take 1 tablet (30 mg total) by mouth daily. 30 tablet 11   ??? lancets Misc Use TID to monitor blood sugar. (IDDM 250.01) 100 each 12   ??? levothyroxine (SYNTHROID, LEVOTHROID) 50 MCG tablet Take 0.5 tablets (25 mcg total) by mouth every morning. 30 tablet 12   ??? magnesium oxide (MAG-OX) 400 mg tablet Take 1 tablet (400 mg total) by mouth Two (2) times a day. 180 tablet 3   ??? MEDICAL SUPPLY ITEM Pt has DM with neuropathy and callus 2 Units 0   ??? metoprolol succinate (TOPROL-XL) 50 MG 24 hr tablet Take 1 tablet (50 mg total) by mouth daily. To control heart rate. 30 tablet 12   ??? MULTIVIT-MINERALS/FA/LYCOPENE (ONE-A-DAY MEN'S ORAL) Take 1 tablet by mouth daily with breakfast.     ??? nepafenac 0.3 % DrpS Apply to eye.     ??? nitroglycerin (NITROSTAT) 0.4 MG SL tablet Place 0.4 mg under the tongue.     ??? pen needle, diabetic 31 gauge x 5/16 Ndle USE AS DIRECTED WITH INSULIN ADMINISTRATION 100 each 99   ??? pregabalin (LYRICA) 50 MG capsule Take 1 capsule (50 mg total) by mouth Three (3) times a day. 270 capsule 3   ??? ticagrelor (BRILINTA) 90 mg Tab Take 1 tablet (90 mg total) by mouth Two (2) times a day. 180 tablet 3   ??? triamcinolone (KENALOG) 0.1 % cream Apply topically Two (2) times a day. 80 g 0   ??? warfarin (COUMADIN) 2 MG tablet Take 2 tablets (4 mg total) by mouth daily. Or as directed by physician. 60 tablet 12     No current facility-administered medications for this visit.        Allergies:   Allergies   Allergen Reactions   ??? Clopidogrel Anaphylaxis     Other reaction(s): ANAPHYLAXIS;altered mental status;   (electrolytes out of wack and confusion per family; THEY DO NOT REMEMBER ANY OTHER REACTION)- MSB 10/10/15   ??? Ciprofloxacin Itching   ??? Spironolactone Other (See Comments)     gynecomastia   ??? Pregabalin Itching       Adherence:   Medication Adherence    Patient reported X missed doses in the last month:  1  Specialty Medication:  Bosutinib  Demonstrates  understanding of importance of adherence:  yes  Informant:  caregiver  Reliability of informant:  reliable  Provider-estimated medication adherence level:  good  Patient is at risk for Non-Adherence:  Yes  Reasons for non-adherence:  adverse effects  Support network for adherence:  family member  Medication Assistance Program  Refill Coordination  Shipping Information         Education Points:   Patient Counseling    Counseled the patient on the following:  doses and administration discussed, safe handling, storage, and disposal discussed, possible adverse effects and management discussed, possible drug and prescription drug interactions discussed, possible drug and OTC drug and food interactions discussed, lab monitoring and follow-up discussed, cost of medications and cost implications discussed, adherence and missed doses discussed, pharmacy contact information discussed          Pharmacy: [facility]     Confirmed Address and Phone Number: Yes     Medication Access:   [facility] Med Referral: PA NOT REQUIRED  This referral has been APPROVED with the details below:  ??  Medication (Brand/Generic): BOSULIF  Insurance/Contact: [employer]  Authorization ID/Case#: [id number]  ??  Final test claim:  Dispensing Specialty Pharmacy: [facility]  Anticipated Co-Pay: $8.35    Drug Interactions:   Outpatient Prescriptions Marked as Taking for the 10/10/16 encounter (Appointment) with Gilberto Better, CPP   Medication Sig Dispense Refill   ??? acetaminophen (TYLENOL) 500 MG tablet Take 500 mg by mouth daily as needed for pain (In the morning for pain in feet).     ??? atorvastatin (LIPITOR) 80 MG tablet Take 1 tablet (80 mg total) by mouth daily. 90 tablet 3   ??? blood sugar diagnostic (TRUETRACK TEST) Strp Use three times daily to monitor blood sugar. (Dx: IDDM, ICD-9: 250.01) 100 each 12   ??? bosutinib 100 mg Tab Take 200 mg by mouth daily.      ??? digoxin (LANOXIN) 125 mcg tablet Take 1 tablet (125 mcg total) by mouth daily. 30 tablet 12   ??? enalapril (VASOTEC) 5 MG tablet Take 1 tablet (5 mg total) by mouth daily. 60 tablet 11   ??? famotidine (PEPCID) 20 MG tablet Take 1 tablet (20 mg total) by mouth nightly as needed for heartburn. Take 2 hours after chemo pill. 180 tablet 3   ??? fenofibrate (LOFIBRA) 160 MG tablet Take 1 tablet (160 mg total) by mouth daily. 90 tablet 3   ??? furosemide (LASIX) 20 MG tablet Take 20 mg by mouth daily.     ??? GLYCINE CARBONATE SODIUM (EFFERVESCENT MISC) Denture tabs     ??? IMODIUM A-D 2 mg capsule Take 2 mg by mouth 4 (four) times a day as needed.      ??? insulin glargine (LANTUS SOLOSTAR U-100 INSULIN) 100 unit/mL (3 mL) injection pen Inject 0.5 mL (50 Units total) under the skin nightly. Adjust as directed. 45 mL 3   ??? isosorbide mononitrate (IMDUR) 30 MG 24 hr tablet Take 1 tablet (30 mg total) by mouth daily. 30 tablet 11   ??? lancets Misc Use TID to monitor blood sugar. (IDDM 250.01) 100 each 12   ??? levothyroxine (SYNTHROID, LEVOTHROID) 50 MCG tablet Take 0.5 tablets (25 mcg total) by mouth every morning. 30 tablet 12   ??? magnesium oxide (MAG-OX) 400 mg tablet Take 1 tablet (400 mg total) by mouth Two (2) times a day. 180 tablet 3   ??? MEDICAL SUPPLY ITEM Pt has DM with neuropathy and callus 2  Units 0   ??? metoprolol succinate (TOPROL-XL) 50 MG 24 hr tablet Take 1 tablet (50 mg total) by mouth daily. To control heart rate. 30 tablet 12   ??? MULTIVIT-MINERALS/FA/LYCOPENE (ONE-A-DAY MEN'S ORAL) Take 1 tablet by mouth daily with breakfast.     ??? nepafenac 0.3 % DrpS Apply to eye.     ??? nitroglycerin (NITROSTAT) 0.4 MG SL tablet Place 0.4 mg under the tongue.     ??? pen needle, diabetic 31 gauge x 5/16 Ndle USE AS DIRECTED WITH INSULIN ADMINISTRATION 100 each 99   ??? pregabalin (LYRICA) 50 MG capsule Take 1 capsule (50 mg total) by mouth Three (3) times a day. 270 capsule 3   ??? ticagrelor (BRILINTA) 90 mg Tab Take 1 tablet (90 mg total) by mouth Two (2) times a day. 180 tablet 3   ??? triamcinolone (KENALOG) 0.1 % cream Apply topically Two (2) times a day. 80 g 0   ??? warfarin (COUMADIN) 2 MG tablet Take 2 tablets (4 mg total) by mouth daily. Or as directed by physician. 60 tablet 12   ??? [DISCONTINUED] furosemide (LASIX) 20 MG tablet Take 1 tablet (20 mg total) by mouth every other day. 90 tablet 1     Drug Interactions    Drug interactions evaluated:  yes  Clinically relevant drug interactions identified:  yes   Interactions list:  Interaction w/ famotidine. [name] taking famotidine once daily 2 hours after bosutinib (after absorption).          Assessment: [name] is a 83 y.o. male with CML being treated currently with bosutinib. Overall, he is tolerating his treatment quite well.      Plan:   -Continue bosutinib 200 mg once daily w/ food    F/u: 01/09/17    I spent 20 minutes with [name] in direct patient care.

## 2016-10-11 NOTE — Unmapped (Signed)
Per test claim for BOSULIF at the [facility], patient needs Medication Assistance Program for Prior Authorization.

## 2016-10-11 NOTE — Unmapped (Signed)
Specialty Pharmacy Refill Coordination Note     Ivan Pierce. Ivan Pierce is a 83 y.o. male contacted today regarding refills of his specialty medication(s).    Reviewed and verified with patient:     [address]  [place] [address]    Specialty medication(s) and dose(s) confirmed: yes  Changes to medications: no  Changes to insurance: no    Medication Adherence    Patient reported X missed doses in the last month:  0  Specialty Medication:  Bosulif  Patient is on additional specialty medications:  No  Patient is on more than two specialty medications:  No  Any gaps in refill history greater than 2 weeks in the last 3 months:  no  Demonstrates understanding of importance of adherence:  yes  Informant:  spouse  Reliability of informant:  reliable  Provider-estimated medication adherence level:  90-100%  Patient is at risk for Non-Adherence:  No  Support network for adherence:  family member  Confirmed plan for next specialty medication refill:  delivery by pharmacy  Medication Assistance Program  Refill Coordination  Has the Patient's Contact Information Changed:  No  Is the Shipping Address Different:  No  Shipping Information  Delivery Scheduled:  Yes  Delivery Date:  10/17/16               Ivan Pierce  Specialty Pharmacy Technician

## 2016-10-13 NOTE — Unmapped (Signed)
[facility] Follow-up    Patient Name: Ivan Pierce  Patient Age: 83 y.o.  Encounter Date: 10/10/2016    Primary Care Provider:  Blake Divine, MD    Referring Physician:  Mar Daring, MD  [address]  [facility]  [place], [place] [address]    Reason for visit:   F/u CML, on bosutinib    Assessment:  Mr. WINFORD HEHN is a 83 y.o. male with history of cardiomyopathy being followed by Cardiology and cp-CML with MMR. He was initially treated on imatinib 400 mg daily but subsequently developed intolerance and was switched to bosutinib with continued MMR. He presents to clinic for a follow up appointment.     Mr. Weinreb has had difficult few months unrelated to his CML. In late May, he presented to his local ER with exertional dyspnea and chest tightness. A left heart cardiac cath revealed a 95% RCA blockage. He then underwent stent placement. Mr. Stumpe continues to have exertional dyspnea and I encouraged him to follow up with his cardiologist. He also has a URI and I recommended supportive care measurements.    From a CML perspective, Mr. Rodino continues to tolerate bosutinib very well without complications. BCR-ABL1 PCR pending from today's visit. He has had excellent control of his CML in MMR. He will continue on Bosutinib 200 mg daily. We will continue to follow up with him every three months.    Regarding his elevated creatinine, Mr. Consuegra historically has fluctuation in his creatinine. He has had recent medication adjustments made by cardiology. Given his underlying cardiomyopathy, I will not give him IV fluids. There is no current need for dose modification of bosutinib.    Plan and Recommendations:  1. Continue bosutinib 200 mg daily  2. Diuresis per cardiology  3. F/u BCR ABL  4. RTC in three months  5. May try Mucinex DM for cough; strongly encouraged him to seek medical evaluation if he develops a fever or worsening symptoms    Dr. Vertell Limber was available.    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  Presenting WBC- 41.9    PCR: BCR-ABL p210 59% IS   Cytogenetics: t(9;22)    Started Imatinib 11/13/2012    3 month PCR 0.184% (02/22/13)  6 month PCR 0.091% (06/15/13)  9 month PCR 0.016% (08/13/13)  12 month PCR 0.02% (11/2013)  04/29/14: PCR 0.003%  Imatinib held 04/29/2014 for GI complaints and increased SOB  Imatinib 400 mg every other day- 05/06/2014  Imatinib 400 mg daily- 05/27/2014  Imatinib 400 mg every other day- 06/02/2014 due to significant diarrhea  Imatinib 400 mg daily- 06/10/2014    PCR: 07/2014: 0.007%  PCR: 9/16 - 0.002%  PCR 02/24/15: 0.001%  PCR 04/21/15: 0.001%  07/12/15: 0.002%  10/13/15: 0.001%    12/19/15: Imatinib held d/t significant malaise/fatigue    01/08/16: Will restart imatinib at 200 mg daily once he receives shipment    01/15/16: Started bosutinib 100 mg daily  01/23/16: Increase bosutinib to 200 mg daily    03/2016: 0.002%  07/04/16: 0.001%  10/10/16: pending        CML (chronic myelocytic leukemia) (CMS-HCC)    09/26/2011 Initial Diagnosis     CML (chronic myelocytic leukemia)         11/13/2012 -  Chemotherapy     Imatinib         04/29/2014 Adverse Reaction     Gleevec held d/t GI complaints, increased SOB, increased swelling         05/06/2014 -  Chemotherapy     Restart imatinib at 400 mg every other day         05/27/2014 -  Chemotherapy     Increase imatinib to 400 mg daily         06/02/2014 Adverse Reaction     Reduce imatinib to 400 mg every other day d/t significant diarrhea         06/10/2014 -  Chemotherapy     Restart imatinib 400 mg daily         04/21/2015 -  Chemotherapy     Dose reduced imatinib to 400 mg every other day d/t interaction with increased dose of eplerenone and evidence of dehydration/increased gleevec toxicity.         04/28/2015 -  Chemotherapy     Resumed imatinib 400 mg daily         12/19/2015 -  Chemotherapy     Imatinib held d/t significant malaise/fatigue.         01/15/2016 -  Chemotherapy     Started on bosutinib 100 mg daily         01/23/2016 -  Chemotherapy     Bosutinib increased to 200 mg daily          Interval History:  Since last seen here, Mr. Meunier has had difficult few months unrelated to his CML. In late May, he presented to his local ER with exertional dyspnea and chest tightness. A left heart cardiac cath revealed a 95% RCA blockage. He then underwent stent placement. His shortness of breath is better but remains a problem. I'm not able to do what I want to do.    His appetite is good. His weight fluctuates depending on fluid status. He sees his cardiologist again on 10/31/16. He missed one dose of bosutinib while in the hospital.    Denies fevers. Currently has a cold. This has been ongoing since he saw Dr. Mariana Kaufman, who gave him an antibiotic. He has not gotten any better and continues with a cough; he feels like he needs to cough up phlegm.    Denies headache. Denies CP. Denies abd pain/nausea. Denies diarrhea. Has a little LE edema. Denies rash. His blood sugar was 161 this morning.    Otherwise, he denies new constitutional symptoms such as anorexia, weight loss, night sweats or unexplained fevers.  Furthermore, he denies symptoms of marrow failure: unexplained bleeding or bruising, recurrent or unexplained intercurrent infections, dyspnea on exertion, lightheadedness, palpitations or chest pain.  There have been no new or unexplained pains or self-identified masses, swelling or enlarged lymph nodes.    Past Medical, Surgical and Family History were reviewed and pertinent updates were made in the Electronic Medical Record    Review of Systems:  Other than as reported above in the interim history, the other systems reviewed were unremarkable.    ECOG Performance Status:     Past Medical History:  Past Medical History:   Diagnosis Date   ??? Asthma    ??? Benign prostatic hypertrophy    ??? CHF (congestive heart failure) (CMS-HCC)    ??? CML (chronic myelocytic leukemia) (CMS-HCC)     Detection of BCR-ABL1 p210 RNA suggests the presence of cells containing t(9;22), consistent with a diagnosis of leukemia.  Oral chemotherapeutic agent started Sept 2014.   ??? Coronary artery disease    ??? Gout    ??? Hypercholesterolemia    ??? Hypertension    ??? Hypothyroidism    ??? Insulin dependent diabetes mellitus (CMS-HCC)    ??? Iron deficiency anemia    ??? Leukocytosis     WBC 37.0 July 2014 @ outside lab.  Per the smear review, There is a neutrophilic leukocytosis with increased myelocytes and basophils consistent with CML. I reviewed the patient's test results from 03/16/12 which indicate the karyotypic phenotype ([place] translocation) typically seen in CML.  The patient has CML and appropriate follow up is indicated.    ??? Medicare annual wellness visit, initial 09/20/2014   ??? Paroxysmal atrial fibrillation (CMS-HCC)      Medications:    Current Outpatient Prescriptions   Medication Sig Dispense Refill   ??? acetaminophen (TYLENOL) 500 MG tablet Take 500 mg by mouth daily as needed for pain (In the morning for pain in feet).     ??? atorvastatin (LIPITOR) 80 MG tablet Take 1 tablet (80 mg total) by mouth daily. 90 tablet 3   ??? blood sugar diagnostic (TRUETRACK TEST) Strp Use three times daily to monitor blood sugar. (Dx: IDDM, ICD-9: 250.01) 100 each 12   ??? digoxin (LANOXIN) 125 mcg tablet Take 1 tablet (125 mcg total) by mouth daily. 30 tablet 12   ??? enalapril (VASOTEC) 5 MG tablet Take 1 tablet (5 mg total) by mouth daily. 60 tablet 11   ??? famotidine (PEPCID) 20 MG tablet Take 1 tablet (20 mg total) by mouth nightly as needed for heartburn. Take 2 hours after chemo pill. 180 tablet 3   ??? fenofibrate (LOFIBRA) 160 MG tablet Take 1 tablet (160 mg total) by mouth daily. 90 tablet 3   ??? GLYCINE CARBONATE SODIUM (EFFERVESCENT MISC) Denture tabs     ??? IMODIUM A-D 2 mg capsule Take 2 mg by mouth 4 (four) times a day as needed.      ??? insulin glargine (LANTUS SOLOSTAR U-100 INSULIN) 100 unit/mL (3 mL) injection pen Inject 0.5 mL (50 Units total) under the skin nightly. Adjust as directed. 45 mL 3   ??? isosorbide mononitrate (IMDUR) 30 MG 24 hr tablet Take 1 tablet (30 mg total) by mouth daily. 30 tablet 11   ??? lancets Misc Use TID to monitor blood sugar. (IDDM 250.01) 100 each 12   ??? levothyroxine (SYNTHROID, LEVOTHROID) 50 MCG tablet Take 0.5 tablets (25 mcg total) by mouth every morning. 30 tablet 12   ??? magnesium oxide (MAG-OX) 400 mg tablet Take 1 tablet (400 mg total) by mouth Two (2) times a day. 180 tablet 3   ??? MEDICAL SUPPLY ITEM Pt has DM with neuropathy and callus 2 Units 0   ??? metoprolol succinate (TOPROL-XL) 50 MG 24 hr tablet Take 1 tablet (50 mg total) by mouth daily. To control heart rate. 30 tablet 12   ??? MULTIVIT-MINERALS/FA/LYCOPENE (ONE-A-DAY MEN'S ORAL) Take 1 tablet by mouth daily with breakfast.     ??? nepafenac 0.3 % DrpS Apply to eye.     ??? nitroglycerin (NITROSTAT) 0.4 MG SL tablet Place 0.4 mg under the tongue.     ??? pen needle, diabetic 31 gauge x 5/16 Ndle USE AS DIRECTED WITH INSULIN ADMINISTRATION 100 each 99   ??? pregabalin (LYRICA) 50 MG capsule Take 1 capsule (50 mg total) by mouth Three (3) times a day. 270 capsule 3   ??? ticagrelor (BRILINTA) 90 mg  Tab Take 1 tablet (90 mg total) by mouth Two (2) times a day. 180 tablet 3   ??? triamcinolone (KENALOG) 0.1 % cream Apply topically Two (2) times a day. 80 g 0   ??? warfarin (COUMADIN) 2 MG tablet Take 2 tablets (4 mg total) by mouth daily. Or as directed by physician. 60 tablet 12   ??? BOSULIF 100 mg Tab TAKE 2 TABLETS (200MG ) BY MOUTH ONCE DAILY 60 tablet PRN   ??? furosemide (LASIX) 20 MG tablet Take 20 mg by mouth daily.       No current facility-administered medications for this visit.      Vital Signs:  BSA: 2.08 meters squared  Vitals:    10/10/16 1226   BP: 124/60   Pulse: 54   Temp: 36.6 ??C (97.9 ??F)   SpO2: 99%     Physical Exam:   General: Resting in no apparent distress  HEENT:  PERRL. No scleral icterus or conjunctival injection. Oral mucosa without ulceration, erythema, exudate or purpura.    Lymph node exam:  No lymphadenopathy in the anterior/posterior cervical, supraclavicular, axillary regions.  Heart:  Regular rate and rhythm. S1, S2.  No murmurs, gallops or rubs.  Lungs:  Breathing is unlabored and patient is speaking full sentences with ease.  No stridor.  Auscultation of lung fields reveals normal air movement without rales, ronchi or crackles.    GI:  No distention or pain on palpation. No palpable hepatomegaly or splenomegaly.  No palpable masses.  Skin:  No rashes, petechiae or purpura.  No areas of skin breakdown.  Musculoskeletal: No grossly-evident joint effusions or deformities.  Range of motion about the shoulder, elbow, hips and knees is grossly normal.    Psychiatric:  Alert and oriented to person, place, time and situation.  Range of affect is appropriate.    Neurologic:  Gait is normal. Cerebellar tasks are symmetric and completed with ease.   Extremities:  Appear well-perfused. No clubbing, edema or cyanosis.    Relevant Laboratory, radiology and pathology results:  Lab on 10/10/2016   Component Date Value Ref Range Status   ??? Sodium 10/10/2016 134* 135 - 145 mmol/L Final   ??? Potassium 10/10/2016 4.9  3.5 - 5.0 mmol/L Final   ??? Chloride 10/10/2016 100  98 - 107 mmol/L Final   ??? CO2 10/10/2016 23.0  22.0 - 30.0 mmol/L Final   ??? BUN 10/10/2016 20  7 - 21 mg/dL Final   ??? Creatinine 10/10/2016 1.68* 0.70 - 1.30 mg/dL Final   ??? BUN/Creatinine Ratio 10/10/2016 12   Final   ??? EGFR MDRD Non Af Amer 10/10/2016 40* >=60 mL/min/1.69m2 Final   ??? EGFR MDRD Af Amer 10/10/2016 48* >=60 mL/min/1.46m2 Final   ??? Anion Gap 10/10/2016 11  9 - 15 mmol/L Final   ??? Glucose 10/10/2016 247* 65 - 179 mg/dL Final   ??? Calcium 16/12/9602 9.2  8.5 - 10.2 mg/dL Final   ??? Albumin 54/11/8117 3.9  3.5 - 5.0 g/dL Final   ??? Total Protein 10/10/2016 7.2  6.5 - 8.3 g/dL Final   ??? Total Bilirubin 10/10/2016 0.3  0.0 - 1.2 mg/dL Final   ??? AST 14/78/2956 34  19 - 55 U/L Final   ??? ALT 10/10/2016 26  19 - 72 U/L Final   ??? Alkaline Phosphatase 10/10/2016 98  38 - 126 U/L Final   ??? Collection 10/10/2016 Collected   Final   ??? WBC 10/10/2016 6.8  4.5 - 11.0 10*9/L Final   ???  RBC 10/10/2016 3.48* 4.50 - 5.90 10*12/L Final   ??? HGB 10/10/2016 9.2* 13.5 - 17.5 g/dL Final   ??? HCT 95/28/4132 28.4* 41.0 - 53.0 % Final   ??? MCV 10/10/2016 81.6  80.0 - 100.0 fL Final   ??? MCH 10/10/2016 26.4  26.0 - 34.0 pg Final   ??? MCHC 10/10/2016 32.4  31.0 - 37.0 g/dL Final   ??? RDW 44/03/270 16.2* 12.0 - 15.0 % Final   ??? MPV 10/10/2016 7.8  7.0 - 10.0 fL Final   ??? Platelet 10/10/2016 269  150 - 440 10*9/L Final   ??? Absolute Neutrophils 10/10/2016 4.7  2.0 - 7.5 10*9/L Final   ??? Absolute Lymphocytes 10/10/2016 1.3* 1.5 - 5.0 10*9/L Final   ??? Absolute Monocytes 10/10/2016 0.5  0.2 - 0.8 10*9/L Final   ??? Absolute Eosinophils 10/10/2016 0.2  0.0 - 0.4 10*9/L Final   ??? Absolute Basophils 10/10/2016 0.1  0.0 - 0.1 10*9/L Final   ??? Large Unstained Cells 10/10/2016 3  0 - 4 % Final   ??? Microcytosis 10/10/2016 Slight* Not Present Final   ??? Anisocytosis 10/10/2016 Slight* Not Present Final   ??? Hypochromasia 10/10/2016 Marked* Not Present Final   ??? Smear Review Comments 10/10/2016 See Comment* Undefined Final    SLIDE REVIEWED Irregularly contracted RBCs present.    ??? Burr Cells 10/10/2016 Present* Not Present Final

## 2016-10-13 NOTE — Progress Notes (Signed)
This encounter was created in error - please disregard.

## 2016-10-15 MED FILL — BOSULIF/100MG/TABS: BOSULIF/100MG/TABS | 30 days supply | Qty: 60 | Fill #0

## 2016-10-16 ENCOUNTER — Other Ambulatory Visit (INDEPENDENT_AMBULATORY_CARE_PROVIDER_SITE_OTHER): Payer: Medicare HMO

## 2016-10-16 DIAGNOSIS — I2510 Atherosclerotic heart disease of native coronary artery without angina pectoris: Secondary | ICD-10-CM

## 2016-10-16 DIAGNOSIS — I5022 Chronic systolic (congestive) heart failure: Secondary | ICD-10-CM

## 2016-10-16 DIAGNOSIS — I480 Paroxysmal atrial fibrillation: Secondary | ICD-10-CM

## 2016-10-16 DIAGNOSIS — I10 Essential (primary) hypertension: Secondary | ICD-10-CM

## 2016-10-17 LAB — HEPATIC FUNCTION PANEL
ALBUMIN: 3.8 g/dL (ref 3.5–4.7)
ALT: 21 IU/L (ref 0–44)
AST: 34 IU/L (ref 0–40)
Alkaline Phosphatase: 91 IU/L (ref 39–117)
BILIRUBIN, DIRECT: 0.08 mg/dL (ref 0.00–0.40)
Total Protein: 7.2 g/dL (ref 6.0–8.5)

## 2016-10-31 ENCOUNTER — Ambulatory Visit (INDEPENDENT_AMBULATORY_CARE_PROVIDER_SITE_OTHER): Payer: Medicare HMO | Admitting: Nurse Practitioner

## 2016-10-31 ENCOUNTER — Ambulatory Visit (INDEPENDENT_AMBULATORY_CARE_PROVIDER_SITE_OTHER): Payer: Medicare HMO | Admitting: *Deleted

## 2016-10-31 ENCOUNTER — Encounter: Payer: Self-pay | Admitting: Nurse Practitioner

## 2016-10-31 VITALS — BP 120/54 | HR 73 | Ht 66.0 in | Wt 198.8 lb

## 2016-10-31 DIAGNOSIS — N183 Chronic kidney disease, stage 3 unspecified: Secondary | ICD-10-CM

## 2016-10-31 DIAGNOSIS — Z7901 Long term (current) use of anticoagulants: Secondary | ICD-10-CM | POA: Diagnosis not present

## 2016-10-31 DIAGNOSIS — I5042 Chronic combined systolic (congestive) and diastolic (congestive) heart failure: Secondary | ICD-10-CM | POA: Diagnosis not present

## 2016-10-31 DIAGNOSIS — I480 Paroxysmal atrial fibrillation: Secondary | ICD-10-CM

## 2016-10-31 DIAGNOSIS — I10 Essential (primary) hypertension: Secondary | ICD-10-CM | POA: Diagnosis not present

## 2016-10-31 DIAGNOSIS — I255 Ischemic cardiomyopathy: Secondary | ICD-10-CM | POA: Diagnosis not present

## 2016-10-31 DIAGNOSIS — I2510 Atherosclerotic heart disease of native coronary artery without angina pectoris: Secondary | ICD-10-CM | POA: Diagnosis not present

## 2016-10-31 LAB — POCT INR: INR: 5.9

## 2016-10-31 NOTE — Patient Instructions (Signed)
Follow-Up: Your physician recommends that you schedule a follow-up appointment in: 3 months with Dr. End.  It was a pleasure seeing you today here in the office. Please do not hesitate to give us a call back if you have any further questions. [phone]  Pamela A. RN, BSN     

## 2016-10-31 NOTE — Progress Notes (Signed)
Office Visit    Patient Name: Lucas Caldwell Date of Encounter: 10/31/2016  Primary Care Provider:  Juanell Fairly, MD Primary Cardiologist:  Andree Coss, MD (formerly K. Andree Elk, MD @ [facility])  Chief Complaint    83 year old male with prior history of coronary artery disease, mixed ischemic and nonischemic cardio myopathy, hypertension, hyperlipidemia, type 2 diabetes mellitus, paroxysmal atrial fibrillation on chronic Coumadin, stage III chronic kidney disease, CML, iron deficiency anemia, BPH, and hypothyroidism, who presents for f/u after hospitalization for c/p.  Past Medical History    Past Medical History:  Diagnosis Date  . Arthritis    "probably in his legs" (08/15/2016)  . Asthma   . Blind right eye   . BPH (benign prostatic hyperplasia)   . Breast asymmetry    Left breast is larger, present for several years.  [name] CAD (coronary artery disease)    a. Cath in the late 90's - reportedly ok;  b. 2014 s/p stenting x 2 @ [facility]; c 08/19/16 Cath/PCI with DES -> RCA, plan to treat LM 70% medically. Seen by surgery and felt to be too high risk for CABG.  . Chronic combined systolic (congestive) and diastolic (congestive) heart failure ([place])    a. Previously reduced EF-->50% by echo in 2012;  b. 06/2015 Echo: EF 50-55%  c. 07/2016 Echo: EF 45-50%.  . CKD (chronic kidney disease), stage III   . CML (chronic myelocytic leukemia) ([place])   . GERD (gastroesophageal reflux disease)   . Gout   . Hyperlipemia   . Hypertension   . Hypothyroidism   . Iron deficiency anemia   . Migraine    "in the 1960s" (08/15/2016)  . NICM (nonischemic cardiomyopathy) ([place])    a. Previously reduced EF-->50% by echo in 2012;  b. 06/2015 Echo: EF 50-55%, Gr2 DD, mild MR, mildly dil LA, Ao sclerosis, mild TR;  c. 07/2016 Echo: EF 45-50%, ? inf HK, mild MR, mildly dil LA.  [name] PAF (paroxysmal atrial fibrillation) ([facility])    a. ? Dx 2014-->s/p DCCV;  b. CHA2DS2VASc = 6-->chronic coumadin.  [name] RBBB   . Type II diabetes mellitus  ([place])    Past Surgical History:  Procedure Laterality Date  . CATARACT EXTRACTION W/ INTRAOCULAR LENS  IMPLANT, BILATERAL Bilateral   . CORONARY ANGIOPLASTY WITH STENT PLACEMENT  09/2012   2 stents  . CORONARY STENT INTERVENTION N/A 08/19/2016   Procedure: Coronary Stent Intervention;  Surgeon: [name], MD;  Location: [place] [facility];  Service: Cardiovascular;  Laterality: N/A;  . HERNIA REPAIR     "navel"  . LEFT HEART CATH AND CORONARY ANGIOGRAPHY N/A 08/14/2016   Procedure: Left Heart Cath and Coronary Angiography;  Surgeon: [name], MD;  Location: [place] [facility];  Service: Cardiovascular;  Laterality: N/A;  . TOE AMPUTATION Right    "big toe"    Allergies  Allergies  Allergen Reactions  . Clopidogrel Anaphylaxis    Other reaction(s): ANAPHYLAXIS;altered mental status;  (electrolytes "out of wack" and confusion per family; THEY DO NOT REMEMBER ANY OTHER REACTION)- MSB 10/10/15  . Ciprofloxacin Itching  . Lyrica [Pregabalin] Itching  . Spironolactone     Other reaction(s): Other (See Comments) gynecomastia  . Plavix [Clopidogrel Bisulfate] Anxiety    History of Present Illness    83 year old male with the above complex past medical history. He has a history of nonischemic cardiomyopathy dating back to the late 1990s. He was later diagnosed with coronary artery disease and underwent stenting 2  at [facility] in 2014. Other history includes hypertension, hyperlipidemia, diabetes, paroxysmal A. Fib on Coumadin, stage III chronic kidney disease, CML, iron deficiency anemia, BPH, obesity, and hypothyroidism. Despite prior history of cardiomyopathy, he did have normalization of LV function by echo in 2012 with repeat echo in April 2017 showing an EF of 50-55%.  In May of this year, he was admitted to [facility] with progressive dyspnea. Echo showed EF 45-50%. Catheterization showed 70% left main disease with severe right coronary artery disease. He was transferred  to [facility] and was evaluated by thoracic surgery, but felt to be too high risk for surgery. He then underwent successful PCI of the right coronary artery.  It was felt that the left main should be medically managed though high risk stenting could be performed for recurrent symptoms.    I saw him in follow-up on June 20, at which time he had some dyspnea on exertion which might have been attributable to brilinta therapy.he was not having any chest pain. Continued medical therapy was recommended and he enrolled in cardiac rehabilitation.  He was readmitted in early July with recurrent chest pain. He ruled out. Films were reviewed and isosorbide mononitrate therapy was added. Again, medical therapy was recommended, reserving high-risk left main stenting for recurrent symptoms.  Since his last hospitalization, he has done quite well. He has not had any chest pain and denies dyspnea on exertion, palpitations, PND, orthopnea, dizziness, syncope, edema, or early satiety.  [name] remaining active at home and walking some.  He plans to re-enroll in cardiac rehabilitation and in fact they just called him today.  Home Medications    Prior to Admission medications   Medication Sig Start Date End Date Taking? Authorizing Provider  acetaminophen (TYLENOL) 500 MG tablet Take 500 mg by mouth every 6 (six) hours as needed.   Yes [provider]  albuterol (PROVENTIL HFA;VENTOLIN HFA) 108 (90 BASE) MCG/ACT inhaler Inhale 2 puffs into the lungs every 6 (six) hours as needed for wheezing or shortness of breath. 11/04/14  Yes Ahmed Prima, MD  allopurinol (ZYLOPRIM) 300 MG tablet Take 300 mg by mouth daily.  11/10/15  Yes [provider]  atorvastatin (LIPITOR) 80 MG tablet Take 1 tablet (80 mg total) by mouth daily at 6 PM. 08/20/16  Yes Reino Bellis B, NP  bosutinib (BOSULIF) 100 MG tablet Take 200 mg by mouth daily with supper. Take with food.   Yes [provider]  digoxin (LANOXIN)  0.125 MG tablet Take 125 mcg by mouth daily.  11/10/15  Yes [provider]  famotidine (PEPCID) 20 MG tablet Take 20 mg by mouth at bedtime.   Yes [provider]  fenofibrate 160 MG tablet Take 1 tablet (160 mg total) by mouth daily. 08/21/16  Yes Reino Bellis B, NP  hydrocortisone 2.5 % ointment Apply topically 3 (three) times daily. 07/20/16  Yes Triplett, Cari B, FNP  isosorbide mononitrate (IMDUR) 30 MG 24 hr tablet Take 1 tablet (30 mg total) by mouth daily. 09/21/16  Yes Sainani, Belia Heman, MD  LANTUS SOLOSTAR 100 UNIT/ML Solostar Pen Inject 20 Units into the skin at bedtime. Patient taking differently: Inject 50 Units into the skin at bedtime.  12/11/15  Yes Demetrios Loll, MD  levothyroxine (SYNTHROID, LEVOTHROID) 25 MCG tablet Take 25 mcg by mouth daily before breakfast.  10/27/15  Yes [provider]  loperamide (IMODIUM A-D) 2 MG tablet Take 2 mg by mouth 3 (three) times daily as needed  for diarrhea or loose stools.    Yes [provider]  magnesium oxide (MAG-OX) 400 MG tablet Take 400 mg by mouth 2 (two) times daily.   Yes [provider]  metoprolol succinate (TOPROL-XL) 50 MG 24 hr tablet Take 50 mg by mouth every morning. 12/04/15  Yes [provider]  mirtazapine (REMERON) 15 MG tablet Take 15 mg by mouth at bedtime. 11/02/15  Yes [provider]  Multiple Vitamins-Minerals (MULTIVITAMIN ADULTS 50+ PO) Take 1 tablet by mouth every morning.   Yes [provider]  nepafenac (NEVANAC) 0.1 % ophthalmic suspension Place 1 drop into both eyes 3 (three) times daily as needed.   Yes [provider]  nitroGLYCERIN (NITROSTAT) 0.4 MG SL tablet Place 1 tablet (0.4 mg total) under the tongue every 5 (five) minutes x 3 doses as needed for chest pain. 10/02/16  Yes End, [name], MD  pregabalin (LYRICA) 50 MG capsule Take 50 mg by mouth 3 (three) times daily.   Yes [provider]  Spacer/Aero-Holding Chambers  [facility] ADVANTAGE) MISC 1 each by Other route once. 11/04/14  Yes Ahmed Prima, MD  ticagrelor (BRILINTA) 90 MG TABS tablet Take 1 tablet (90 mg total) by mouth 2 (two) times daily. 10/02/16  Yes End, [name], MD  warfarin (COUMADIN) 2 MG tablet Take 4 mg by mouth at bedtime.   Yes [provider]    Review of Systems    As above, he has been doing well without recurrent chest pain or dyspnea. He denies palpitations, PND, orthopnea, dizziness, syncope, edema, or early satiety.  All other systems reviewed and are otherwise negative except as noted above.  Physical Exam    VS:  BP (!) 120/54 (BP Location: Left Arm, Patient Position: Sitting, Cuff Size: Large)   Pulse 73   Ht 5\' 6"  (1.676 m)   Wt 198 lb 12 oz (90.2 kg)   BMI 32.08 kg/m  , BMI Body mass index is 32.08 kg/m. GEN: Well nourished, well developed, in no acute distress.  HEENT: normal.  Neck: Supple, no JVD, carotid bruits, or masses. Cardiac: RRR, no murmurs, rubs, or gallops. No clubbing, cyanosis, edema.  Radials/DP/PT 2+ and equal bilaterally.  Respiratory:  Respirations regular and unlabored, clear to auscultation bilaterally. GI: Soft, nontender, nondistended, BS + x 4. MS: no deformity or atrophy. Skin: warm and dry, no rash. Neuro:  Strength and sensation are intact. Psych: Normal affect.  Accessory Clinical Findings    ECG - regular sinus rhythm, 76, left axis deviation, left anterior fascicular block, right bundle branch block-no acute ST or T changes.  Assessment & Plan    1.  Coronary artery disease:status post drug-eluting stent placement to the right coronary artery in May of this year. He has known, residual 70% left main disease and was recently admitted with chest pain Nitrates were added and since then, he has done exceptionally well without recurrent chest pain or dyspnea. We discussed his residual disease and how we would continue to reserve high-risk stenting for recurrent symptoms  that are recalcitrant to medical therapy. [name] in agreement with that plan.  He remains on statin, nitrate, beta blocker, and brilinta therapy. [name] not on aspirin secondary to chronic Coumadin anticoagulation. He plans to re-enroll in cardiac rehabilitation.  2. Essential hypertension: Blood pressure is stable on beta blocker and nitrate therapy.  3. Paroxysmal atrial fibrillation: He remains in sinus rhythm on beta blocker and digoxin therapy. [name] anticoagulated with Coumadin and  followed in our Coumadin clinic.  4. Chronic combined systolic and diastolic congestive heart failure/ischemic cardiomyopathy: EF 45-50% by most recent echo. Weight has been stable at home and he is euvolemic on exam. Continue beta blocker. He had previously been on ACE inhibitor and a player known therapy but these were discontinued during hospitalization in May secondary to worsening renal function.creatinine stable on July 6.  5. Stage III chronic kidney disease: Creatinine 1.61 on July 6.  6. Hyperlipidemia: LDL 15 on June 1. Continue statin therapy.  7. Disposition: Follow-up with Dr. Saunders Revel in 3 months or sooner if necessary.  [name], NP 10/31/2016, 11:50 AM

## 2016-11-06 ENCOUNTER — Ambulatory Visit (INDEPENDENT_AMBULATORY_CARE_PROVIDER_SITE_OTHER): Payer: Medicare HMO | Admitting: *Deleted

## 2016-11-06 DIAGNOSIS — I480 Paroxysmal atrial fibrillation: Secondary | ICD-10-CM

## 2016-11-06 DIAGNOSIS — Z7901 Long term (current) use of anticoagulants: Secondary | ICD-10-CM | POA: Diagnosis not present

## 2016-11-06 LAB — POCT INR: INR: 2.3

## 2016-11-06 NOTE — Unmapped (Signed)
[facility] Refill Coordination Note  Specialty Medication(s): Bosulif     Additional Medications shipped: none    Ivan Pierce, DOB: Nov 06, 1933  Phone: [phone] (home) , Alternate phone contact: [phone]  Phone or address changes today?: No  All above HIPAA information was verified with patient's family member.  Shipping Address: [address]  [place] [address]   Insurance changes? No    Completed refill call assessment today to schedule patient's medication shipment from the [facility] [phone]).      Confirmed the medication and dosage are correct and have not changed: Yes, regimen is correct and unchanged.    Confirmed patient started or stopped the following medications in the past month:  No, there are no changes reported at this time.    Are you tolerating your medication?:  Ivan Pierce reports tolerating the medication. Verified per patient's spouse.    ADHERENCE    Is this medicine transplant or covered by Medicare Part B? No.        Did you miss any doses in the past 4 weeks? No missed doses reported.    FINANCIAL/SHIPPING    Delivery Scheduled: Yes, Expected medication delivery date: 11/13/2016     Ivan Pierce did not have any additional questions at this time. Wife reported no questions at this time.    Delivery address validated in [facility] scheduling system: Yes, address listed in FSI is correct.    We will follow up with patient monthly for standard refill processing and delivery.      Thank you,  Thad Ranger   [facility] Specialty Pharmacist

## 2016-11-11 MED FILL — BOSULIF/100MG/TABS: BOSULIF/100MG/TABS | 30 days supply | Qty: 60 | Fill #1

## 2016-11-13 ENCOUNTER — Ambulatory Visit (INDEPENDENT_AMBULATORY_CARE_PROVIDER_SITE_OTHER): Payer: Medicare HMO

## 2016-11-13 DIAGNOSIS — I480 Paroxysmal atrial fibrillation: Secondary | ICD-10-CM | POA: Diagnosis not present

## 2016-11-13 DIAGNOSIS — Z7901 Long term (current) use of anticoagulants: Secondary | ICD-10-CM

## 2016-11-13 LAB — POCT INR: INR: 3

## 2016-11-19 ENCOUNTER — Telehealth: Payer: Self-pay | Admitting: Internal Medicine

## 2016-11-19 MED ORDER — ISOSORBIDE MONONITRATE ER 30 MG TABLET,EXTENDED RELEASE 24 HR
ORAL_TABLET | 1 refills | 0 days | Status: CP
Start: 2016-11-19 — End: 2017-01-20

## 2016-11-19 NOTE — Telephone Encounter (Signed)
S/w patient. He has been experiencing lots of belching and nausea when eating and after eating. He states "sometimes I feel like I can't swallow." He says it is painful, burning, and stinging right down the middle of his chest. He feels like its in his esophagus and that food is coming back up. He has been experiencing this now for going on 3 weeks. Patient denies active chest pain, shortness of breath, dizziness, or left arm/jaw pain. Advised patient to call his Primary Care doctor concerning the matter and to contact us back if he develops any new or worsening symptoms.

## 2016-11-19 NOTE — Telephone Encounter (Signed)
Pt wife calling stating pt is having issues with "chest"  Usually hurts when he eats it hurts  He had stens put in June/july Would like advise on this  She states it is not CP

## 2016-11-27 ENCOUNTER — Ambulatory Visit (INDEPENDENT_AMBULATORY_CARE_PROVIDER_SITE_OTHER): Payer: Medicare HMO

## 2016-11-27 DIAGNOSIS — I480 Paroxysmal atrial fibrillation: Secondary | ICD-10-CM

## 2016-11-27 DIAGNOSIS — Z7901 Long term (current) use of anticoagulants: Secondary | ICD-10-CM

## 2016-11-27 LAB — POCT INR: INR: 3.6

## 2016-11-28 ENCOUNTER — Ambulatory Visit
Admission: RE | Admit: 2016-11-28 | Discharge: 2016-11-28 | Disposition: A | Discharge status: Home / Self Care | Payer: MEDICARE | Attending: Pediatrics | Admitting: Pediatrics

## 2016-11-28 DIAGNOSIS — R350 Frequency of micturition: Secondary | ICD-10-CM

## 2016-11-28 DIAGNOSIS — K219 Gastro-esophageal reflux disease without esophagitis: Secondary | ICD-10-CM

## 2016-11-28 DIAGNOSIS — N289 Disorder of kidney and ureter, unspecified: Secondary | ICD-10-CM

## 2016-11-28 DIAGNOSIS — I10 Essential (primary) hypertension: Principal | ICD-10-CM

## 2016-11-28 LAB — BASIC METABOLIC PANEL
ANION GAP: 13 mmol/L (ref 9–15)
BLOOD UREA NITROGEN: 29 mg/dL — ABNORMAL HIGH (ref 7–21)
BUN / CREAT RATIO: 16
CALCIUM: 9.5 mg/dL (ref 8.5–10.2)
CHLORIDE: 105 mmol/L (ref 98–107)
CREATININE: 1.85 mg/dL — ABNORMAL HIGH (ref 0.70–1.30)
EGFR MDRD AF AMER: 43 mL/min/{1.73_m2} — ABNORMAL LOW (ref >=60)
GLUCOSE RANDOM: 154 mg/dL (ref 65–179)
POTASSIUM: 5.2 mmol/L — ABNORMAL HIGH (ref 3.5–5.0)
SODIUM: 138 mmol/L (ref 135–145)

## 2016-11-28 LAB — ESTIMATED AVERAGE GLUCOSE: Estimated average glucose:MCnc:Pt:Bld:Qn:Estimated from glycated hemoglobin: 255

## 2016-11-28 LAB — CHLORIDE: Chloride:SCnc:Pt:Ser/Plas:Qn:: 105

## 2016-11-28 MED ORDER — RANITIDINE 300 MG TABLET
ORAL_TABLET | Freq: Every morning | ORAL | 3 refills | 0.00000 days | Status: CP
Start: 2016-11-28 — End: 2017-03-28

## 2016-11-28 NOTE — Unmapped (Signed)
Add ranitidine      Take famotidine 2 tabs at night

## 2016-11-29 NOTE — Unmapped (Signed)
Assessment/Plan:        Diagnoses and all orders for this visit:    Essential hypertension  Essential hypertension with history of renal insufficiency. Blood pressures well-controlled and we will recheck BUN and creatinine. Recommended avoidance of anything that could hurt the kidneys such as nonsteroidal and anti-inflammatory.  -     Basic Metabolic Panel; Future    Gastroesophageal reflux disease without esophagitis  Patient experiencing GE reflux symptoms with nausea and regurgitation. He reports discomfort is worse when he is lying down. He does have known coronary disease and is status post recent stent. Recommended an increase in his pantoprazole in the evenings to 40 mg. In addition we discussed sleep hygiene. He was also prescribed ranitidine which she could take in the mornings 300 mg. Because his symptoms are predominantly in the evenings we will first boosted dose there. [name] to notify regarding response.  -     Hemoglobin A1c; Future    Renal insufficiency  Patient with history of renal insufficiency we'll check a basic metabolic panel also recommended avoidance of any nephrotoxic agents.  -     Basic Metabolic Panel; Future    Urinary frequency    Other orders  -     ranitidine (ZANTAC) 300 MG tablet; Take 1 tablet (300 mg total) by mouth every morning.      I spent 25 minutes with this patient with over 50% of time devoted to patient counseling and coordination of care    Subjective:       Patient ID: Ivan Pierce is a 83 y.o. male.    HPI 83 year old gentleman with history of type 2 diabetes with recently improved control with titration of insulin. It is been several months since his last A1c. He does report some urinary frequency and dribbling. His main reason for the visit today is feeling of reflux. He feels is after he eats that the food comes up and he is having some nausea as well as some discomfort in the chest, particularly after meals and when he lays down in the evenings. This is complicated by the fact that he's had some shortness of breath with exertion.  Coronary artery disease with recent stent placement. He denies any chest pain. Patient reports reflux and nausea symptoms after meals when he lies down at night. [name] also receiving chemotherapy for CML and has been taking pantoprazole 20 mg after his chemotherapy in the evenings. Patient did have a recent check of kidney function which demonstrated mildly elevated creatinine.    The following portions of the patient's history were reviewed and updated as appropriate: allergies, current medications, past family history, past medical history, past social history, past surgical history and problem list.    Review of Systems  ROS:  Constitutional: denies fever,chills, weight loss.   Vision: Denies double vision, eye pain, blindness  Head: Denies HAs  Resp: Denies cough,SOB, DOE or wheeze  CV: Denies CP/ SOB, LE edema or palpitation  GI: Denies ABD pain, distention, Nausea, vomiting or diarrhea  GU: Denies flank pain, dysuria, hematuria  MSK: Denies myalgia, arthralgias or joint swelling  Integument: Denies rash, pallor, bruising  Neuro: Denies seizures, Headaches, dizziness, speech difficulty  Psychiatry:Denies depressive sx, anxiety, SI or HI  Endocrine: Denies polyuria/polydipsia/ heat or cold intolerance        Objective:    Physical Exam  Physical Exam:  GENERAL APPEARANCE: No acute distress.   HEENT: Sclerae are clear. TMs are normal. Nares are clear.  Oropharynx is clear.   NECK: Supple, no thyromegaly,carotid bruits or adenopathy.   CARDIOVASCULAR: Regular rate and rhythm. No significant murmur, gallop or ectopy.   LUNGS: Clear to auscultation bilaterally.  ABDOMEN: No organomegaly, masses or tenderness.   EXTREMITIES: No edema, good peripheral pulses.   LYMPHATICS: No cervical, axillary or supraclavicular lymphadenopathy noted.  SKIN: No rash.  NEUROLOGIC: CN III-XII intact. No focal motor or sensory deficits noted. Cerebellar function normal. Gait normal.

## 2016-12-04 ENCOUNTER — Ambulatory Visit (INDEPENDENT_AMBULATORY_CARE_PROVIDER_SITE_OTHER): Payer: Medicare HMO | Admitting: Ophthalmology

## 2016-12-04 MED ORDER — INSULIN GLARGINE (U-100) 100 UNIT/ML (3 ML) SUBCUTANEOUS PEN
3 refills | 0 days
Start: 2016-12-04 — End: 2017-05-14

## 2016-12-05 NOTE — Unmapped (Signed)
No sweets and adjust insulin as shown    Notified wife

## 2016-12-06 NOTE — Unmapped (Signed)
[facility] Refill Coordination Note  Specialty Medication(s): Bosulif 100mg     RIGBY LEONHARDT, DOB: [birth year]  Phone: [phone] (home) , Alternate phone contact: N/A  Phone or address changes today?: No  All above HIPAA information was verified with patient's family member.  Shipping Address: [address]  [place] [address]   Insurance changes? No    Completed refill call assessment today to schedule patient's medication shipment from the [facility] [phone]).      Confirmed the medication and dosage are correct and have not changed: Yes, regimen is correct and unchanged.    Confirmed patient started or stopped the following medications in the past month:  No, there are no changes reported at this time.    Are you tolerating your medication?:  Harshan reports tolerating the medication.    ADHERENCE    Did you miss any doses in the past 4 weeks? No missed doses reported.    FINANCIAL/SHIPPING    Delivery Scheduled: Yes, Expected medication delivery date: 12/10/16     Silvano did not have any additional questions at this time.    Delivery address validated in [facility] scheduling system: Yes, address listed in [facility]SI is correct.    We will follow up with patient monthly for standard refill processing and delivery.      Thank you,  [employer]   [facility] Specialty Pharmacist

## 2016-12-08 MED FILL — BOSULIF/100MG/TABS: BOSULIF/100MG/TABS | 30 days supply | Qty: 60 | Fill #2

## 2016-12-09 ENCOUNTER — Ambulatory Visit (INDEPENDENT_AMBULATORY_CARE_PROVIDER_SITE_OTHER): Payer: Medicare HMO

## 2016-12-09 DIAGNOSIS — Z7901 Long term (current) use of anticoagulants: Secondary | ICD-10-CM | POA: Diagnosis not present

## 2016-12-09 DIAGNOSIS — I480 Paroxysmal atrial fibrillation: Secondary | ICD-10-CM

## 2016-12-09 LAB — POCT INR: INR: 3.1

## 2016-12-18 ENCOUNTER — Emergency Department: Payer: Medicare Other

## 2016-12-18 ENCOUNTER — Inpatient Hospital Stay
Admission: EM | Admit: 2016-12-18 | Discharge: 2016-12-20 | DRG: 683 | Disposition: A | Discharge status: Home / Self Care | Payer: Medicare Other | Attending: Internal Medicine | Admitting: Internal Medicine

## 2016-12-18 ENCOUNTER — Encounter: Payer: Self-pay | Admitting: Emergency Medicine

## 2016-12-18 DIAGNOSIS — K224 Dyskinesia of esophagus: Secondary | ICD-10-CM | POA: Diagnosis not present

## 2016-12-18 DIAGNOSIS — K760 Fatty (change of) liver, not elsewhere classified: Secondary | ICD-10-CM | POA: Diagnosis present

## 2016-12-18 DIAGNOSIS — Z888 Allergy status to other drugs, medicaments and biological substances status: Secondary | ICD-10-CM

## 2016-12-18 DIAGNOSIS — D631 Anemia in chronic kidney disease: Secondary | ICD-10-CM | POA: Diagnosis present

## 2016-12-18 DIAGNOSIS — E785 Hyperlipidemia, unspecified: Secondary | ICD-10-CM | POA: Diagnosis present

## 2016-12-18 DIAGNOSIS — N400 Enlarged prostate without lower urinary tract symptoms: Secondary | ICD-10-CM | POA: Diagnosis not present

## 2016-12-18 DIAGNOSIS — Z794 Long term (current) use of insulin: Secondary | ICD-10-CM

## 2016-12-18 DIAGNOSIS — I428 Other cardiomyopathies: Secondary | ICD-10-CM | POA: Diagnosis present

## 2016-12-18 DIAGNOSIS — H5440 Blindness, one eye, unspecified eye: Secondary | ICD-10-CM | POA: Diagnosis not present

## 2016-12-18 DIAGNOSIS — R1314 Dysphagia, pharyngoesophageal phase: Secondary | ICD-10-CM | POA: Diagnosis present

## 2016-12-18 DIAGNOSIS — K319 Disease of stomach and duodenum, unspecified: Secondary | ICD-10-CM | POA: Diagnosis present

## 2016-12-18 DIAGNOSIS — K219 Gastro-esophageal reflux disease without esophagitis: Secondary | ICD-10-CM | POA: Diagnosis not present

## 2016-12-18 DIAGNOSIS — R5383 Other fatigue: Secondary | ICD-10-CM

## 2016-12-18 DIAGNOSIS — Z79890 Hormone replacement therapy (postmenopausal): Secondary | ICD-10-CM

## 2016-12-18 DIAGNOSIS — N183 Chronic kidney disease, stage 3 (moderate): Secondary | ICD-10-CM | POA: Diagnosis present

## 2016-12-18 DIAGNOSIS — I452 Bifascicular block: Secondary | ICD-10-CM | POA: Diagnosis present

## 2016-12-18 DIAGNOSIS — I2510 Atherosclerotic heart disease of native coronary artery without angina pectoris: Secondary | ICD-10-CM | POA: Diagnosis not present

## 2016-12-18 DIAGNOSIS — E1122 Type 2 diabetes mellitus with diabetic chronic kidney disease: Secondary | ICD-10-CM | POA: Diagnosis not present

## 2016-12-18 DIAGNOSIS — Z7901 Long term (current) use of anticoagulants: Secondary | ICD-10-CM

## 2016-12-18 DIAGNOSIS — Z7902 Long term (current) use of antithrombotics/antiplatelets: Secondary | ICD-10-CM

## 2016-12-18 DIAGNOSIS — R001 Bradycardia, unspecified: Secondary | ICD-10-CM | POA: Diagnosis not present

## 2016-12-18 DIAGNOSIS — I130 Hypertensive heart and chronic kidney disease with heart failure and stage 1 through stage 4 chronic kidney disease, or unspecified chronic kidney disease: Secondary | ICD-10-CM | POA: Diagnosis not present

## 2016-12-18 DIAGNOSIS — Z856 Personal history of leukemia: Secondary | ICD-10-CM

## 2016-12-18 DIAGNOSIS — D509 Iron deficiency anemia, unspecified: Secondary | ICD-10-CM | POA: Diagnosis not present

## 2016-12-18 DIAGNOSIS — N179 Acute kidney failure, unspecified: Secondary | ICD-10-CM | POA: Diagnosis not present

## 2016-12-18 DIAGNOSIS — Z23 Encounter for immunization: Secondary | ICD-10-CM

## 2016-12-18 DIAGNOSIS — Z79899 Other long term (current) drug therapy: Secondary | ICD-10-CM

## 2016-12-18 DIAGNOSIS — K222 Esophageal obstruction: Secondary | ICD-10-CM | POA: Diagnosis not present

## 2016-12-18 DIAGNOSIS — E860 Dehydration: Secondary | ICD-10-CM | POA: Diagnosis present

## 2016-12-18 DIAGNOSIS — I480 Paroxysmal atrial fibrillation: Secondary | ICD-10-CM | POA: Diagnosis not present

## 2016-12-18 DIAGNOSIS — R1310 Dysphagia, unspecified: Secondary | ICD-10-CM

## 2016-12-18 DIAGNOSIS — Z961 Presence of intraocular lens: Secondary | ICD-10-CM | POA: Diagnosis present

## 2016-12-18 DIAGNOSIS — Z9841 Cataract extraction status, right eye: Secondary | ICD-10-CM

## 2016-12-18 DIAGNOSIS — Z833 Family history of diabetes mellitus: Secondary | ICD-10-CM

## 2016-12-18 DIAGNOSIS — Z9842 Cataract extraction status, left eye: Secondary | ICD-10-CM

## 2016-12-18 DIAGNOSIS — D649 Anemia, unspecified: Secondary | ICD-10-CM

## 2016-12-18 DIAGNOSIS — E1121 Type 2 diabetes mellitus with diabetic nephropathy: Secondary | ICD-10-CM | POA: Diagnosis present

## 2016-12-18 DIAGNOSIS — Z881 Allergy status to other antibiotic agents status: Secondary | ICD-10-CM

## 2016-12-18 DIAGNOSIS — Z8249 Family history of ischemic heart disease and other diseases of the circulatory system: Secondary | ICD-10-CM

## 2016-12-18 DIAGNOSIS — R0609 Other forms of dyspnea: Secondary | ICD-10-CM

## 2016-12-18 DIAGNOSIS — Z955 Presence of coronary angioplasty implant and graft: Secondary | ICD-10-CM

## 2016-12-18 DIAGNOSIS — E039 Hypothyroidism, unspecified: Secondary | ICD-10-CM | POA: Diagnosis present

## 2016-12-18 DIAGNOSIS — Z87891 Personal history of nicotine dependence: Secondary | ICD-10-CM

## 2016-12-18 DIAGNOSIS — J45909 Unspecified asthma, uncomplicated: Secondary | ICD-10-CM | POA: Diagnosis present

## 2016-12-18 DIAGNOSIS — Z808 Family history of malignant neoplasm of other organs or systems: Secondary | ICD-10-CM

## 2016-12-18 DIAGNOSIS — I440 Atrioventricular block, first degree: Secondary | ICD-10-CM | POA: Diagnosis present

## 2016-12-18 DIAGNOSIS — Z89411 Acquired absence of right great toe: Secondary | ICD-10-CM

## 2016-12-18 DIAGNOSIS — I5042 Chronic combined systolic (congestive) and diastolic (congestive) heart failure: Secondary | ICD-10-CM | POA: Diagnosis not present

## 2016-12-18 DIAGNOSIS — N6489 Other specified disorders of breast: Secondary | ICD-10-CM | POA: Diagnosis not present

## 2016-12-18 LAB — BASIC METABOLIC PANEL
ANION GAP: 7 (ref 5–15)
BUN: 35 mg/dL — ABNORMAL HIGH (ref 6–20)
CO2: 25 mmol/L (ref 22–32)
CREATININE: 2.4 mg/dL — AB (ref 0.61–1.24)
Calcium: 8.9 mg/dL (ref 8.9–10.3)
Chloride: 101 mmol/L (ref 101–111)
GFR, EST AFRICAN AMERICAN: 28 mL/min — AB (ref >60)
GFR, EST NON AFRICAN AMERICAN: 24 mL/min — AB (ref >60)
Glucose, Bld: 359 mg/dL — ABNORMAL HIGH (ref 65–99)
Potassium: 4.5 mmol/L (ref 3.5–5.1)
SODIUM: 133 mmol/L — AB (ref 135–145)

## 2016-12-18 LAB — CBC
HCT: 25.1 % — ABNORMAL LOW (ref 40.0–52.0)
Hemoglobin: 8.3 g/dL — ABNORMAL LOW (ref 13.0–18.0)
MCH: 25.6 pg — ABNORMAL LOW (ref 26.0–34.0)
MCHC: 33 g/dL (ref 32.0–36.0)
MCV: 77.3 fL — ABNORMAL LOW (ref 80.0–100.0)
PLATELETS: 227 10*3/uL (ref 150–440)
RBC: 3.25 MIL/uL — ABNORMAL LOW (ref 4.40–5.90)
RDW: 16.7 % — AB (ref 11.5–14.5)
WBC: 8.3 10*3/uL (ref 3.8–10.6)

## 2016-12-18 LAB — URINALYSIS, COMPLETE (UACMP) WITH MICROSCOPIC
BACTERIA UA: NONE SEEN
BILIRUBIN URINE: NEGATIVE
Glucose, UA: >=500 mg/dL — AB
Hgb urine dipstick: NEGATIVE
KETONES UR: NEGATIVE mg/dL
Leukocytes, UA: NEGATIVE
NITRITE: NEGATIVE
Protein, ur: NEGATIVE mg/dL
RBC / HPF: NONE SEEN RBC/hpf (ref 0–5)
SPECIFIC GRAVITY, URINE: 1.012 (ref 1.005–1.030)
SQUAMOUS EPITHELIAL / LPF: NONE SEEN
pH: 6 (ref 5.0–8.0)

## 2016-12-18 LAB — BRAIN NATRIURETIC PEPTIDE: B NATRIURETIC PEPTIDE 5: 33 pg/mL (ref 0.0–100.0)

## 2016-12-18 LAB — PROTIME-INR
INR: 2.4
PROTHROMBIN TIME: 26 s — AB (ref 11.4–15.2)

## 2016-12-18 LAB — TSH: TSH: 1.971 u[IU]/mL (ref 0.350–4.500)

## 2016-12-18 LAB — TROPONIN I: Troponin I: <0.03 ng/mL (ref <0.03)

## 2016-12-18 LAB — GLUCOSE, CAPILLARY: Glucose-Capillary: 229 mg/dL — ABNORMAL HIGH (ref 65–99)

## 2016-12-18 MED ORDER — HEPARIN SODIUM (PORCINE) 5000 UNIT/ML IJ SOLN: 5000.0000 [IU] | Freq: Three times a day (TID) | INTRAMUSCULAR | Status: DC

## 2016-12-18 MED ORDER — ACETAMINOPHEN 500 MG PO TABS: 500.0000 mg | ORAL_TABLET | Freq: Four times a day (QID) | ORAL | Status: DC | PRN

## 2016-12-18 MED ORDER — LEVOTHYROXINE SODIUM 25 MCG PO TABS
25.0000 ug | ORAL_TABLET | Freq: Every day | ORAL | Status: DC
  Administered 2016-12-19 – 2016-12-20 (×2): 25 ug via ORAL
  Filled 2016-12-18 (×2): qty 1

## 2016-12-18 MED ORDER — ATORVASTATIN CALCIUM 20 MG PO TABS
80.0000 mg | ORAL_TABLET | Freq: Every day | ORAL | Status: DC
  Administered 2016-12-19: 80 mg via ORAL
  Filled 2016-12-18: qty 4

## 2016-12-18 MED ORDER — DIGOXIN 125 MCG PO TABS
125.0000 ug | ORAL_TABLET | Freq: Every day | ORAL | Status: DC
  Administered 2016-12-19: 125 ug via ORAL
  Filled 2016-12-18 (×2): qty 1

## 2016-12-18 MED ORDER — ADULT MULTIVITAMIN W/MINERALS CH
ORAL_TABLET | Freq: Every day | ORAL | Status: DC
  Administered 2016-12-19 – 2016-12-20 (×2): 1 via ORAL
  Filled 2016-12-18 (×2): qty 1

## 2016-12-18 MED ORDER — WARFARIN SODIUM 3 MG PO TABS
3.0000 mg | ORAL_TABLET | Freq: Every day | ORAL | Status: DC
  Administered 2016-12-19: 3 mg via ORAL
  Filled 2016-12-18: qty 1

## 2016-12-18 MED ORDER — DOCUSATE SODIUM 100 MG PO CAPS: 100.0000 mg | ORAL_CAPSULE | Freq: Two times a day (BID) | ORAL | Status: DC | PRN

## 2016-12-18 MED ORDER — BOSUTINIB 100 MG PO TABS: 200.0000 mg | ORAL_TABLET | Freq: Every day | ORAL | Status: DC

## 2016-12-18 MED ORDER — INSULIN ASPART 100 UNIT/ML ~~LOC~~ SOLN
0.0000 [IU] | Freq: Three times a day (TID) | SUBCUTANEOUS | Status: DC
Start: 2016-12-19 — End: 2016-12-20
  Administered 2016-12-19: 12:00:00 5 [IU] via SUBCUTANEOUS
  Administered 2016-12-19: 08:00:00 3 [IU] via SUBCUTANEOUS
  Administered 2016-12-19: 5 [IU] via SUBCUTANEOUS
  Administered 2016-12-20: 2 [IU] via SUBCUTANEOUS
  Filled 2016-12-18 (×4): qty 1

## 2016-12-18 MED ORDER — LOPERAMIDE HCL 2 MG PO CAPS: 2.0000 mg | ORAL_CAPSULE | Freq: Three times a day (TID) | ORAL | Status: DC | PRN

## 2016-12-18 MED ORDER — PREGABALIN 50 MG PO CAPS
50.0000 mg | ORAL_CAPSULE | Freq: Three times a day (TID) | ORAL | Status: DC
  Administered 2016-12-19 – 2016-12-20 (×5): 50 mg via ORAL
  Filled 2016-12-18 (×5): qty 1

## 2016-12-18 MED ORDER — INFLUENZA VAC SPLIT HIGH-DOSE 0.5 ML IM SUSY
0.5000 mL | PREFILLED_SYRINGE | INTRAMUSCULAR | Status: AC
  Administered 2016-12-19: 0.5 mL via INTRAMUSCULAR
  Filled 2016-12-18 (×3): qty 0.5

## 2016-12-18 MED ORDER — INSULIN GLARGINE 100 UNIT/ML ~~LOC~~ SOLN
20.0000 [IU] | Freq: Every day | SUBCUTANEOUS | Status: DC
  Administered 2016-12-19: 01:00:00 20 [IU] via SUBCUTANEOUS
  Filled 2016-12-18 (×2): qty 0.2

## 2016-12-18 MED ORDER — SODIUM CHLORIDE 0.9 % IV BOLUS (SEPSIS)
500.0000 mL | Freq: Once | INTRAVENOUS | Status: AC
  Administered 2016-12-18: 500 mL via INTRAVENOUS

## 2016-12-18 MED ORDER — FENOFIBRATE 160 MG PO TABS
160.0000 mg | ORAL_TABLET | Freq: Every day | ORAL | Status: DC
  Administered 2016-12-19 – 2016-12-20 (×3): 160 mg via ORAL
  Filled 2016-12-18 (×3): qty 1

## 2016-12-18 MED ORDER — MAGNESIUM OXIDE 400 (241.3 MG) MG PO TABS
400.0000 mg | ORAL_TABLET | Freq: Two times a day (BID) | ORAL | Status: DC
  Administered 2016-12-19 – 2016-12-20 (×4): 400 mg via ORAL
  Filled 2016-12-18 (×4): qty 1

## 2016-12-18 MED ORDER — TICAGRELOR 90 MG PO TABS
90.0000 mg | ORAL_TABLET | Freq: Two times a day (BID) | ORAL | Status: DC
  Administered 2016-12-19 – 2016-12-20 (×4): 90 mg via ORAL
  Filled 2016-12-18 (×5): qty 1

## 2016-12-18 MED ORDER — SODIUM CHLORIDE 0.9 % IV SOLN
INTRAVENOUS | Status: DC
  Administered 2016-12-19 – 2016-12-20 (×3): via INTRAVENOUS

## 2016-12-18 MED ORDER — INSULIN GLARGINE 100 UNIT/ML SOLOSTAR PEN: 20.0000 [IU] | PEN_INJECTOR | Freq: Every day | SUBCUTANEOUS | Status: DC

## 2016-12-18 MED ORDER — FAMOTIDINE 20 MG PO TABS: 20.0000 mg | ORAL_TABLET | Freq: Every evening | ORAL | Status: DC | PRN

## 2016-12-18 MED ORDER — ALBUTEROL SULFATE (2.5 MG/3ML) 0.083% IN NEBU: 3.0000 mL | INHALATION_SOLUTION | Freq: Four times a day (QID) | RESPIRATORY_TRACT | Status: DC | PRN

## 2016-12-18 NOTE — ED Provider Notes (Signed)
[facility] Emergency Department Provider Note  ____________________________________________  Time seen: Approximately 3:53 PM  I have reviewed the triage vital signs and the nursing notes.   HISTORY  Chief Complaint Shortness of Breath    HPI Lucas Caldwell is a 83 y.o. male with a history of severe CAD status post stenting of a RCA lesion with a residual left main coronary artery 70% occlusion that was not intervened upon while the patient is being considered for a possible CABG. He reports that since the stent in June 2018, he's had shortness of breath worse with exertion. Over the past month that has been worse, and then over the past week that has been much worse. He now has severely decreased exercise tolerance, unable to walk across his house without having to stop and catch his breath. Denies chest pain dizziness or syncope. No abdominal pain or back pain. He's been compliant with his medications, denies peripheral edema. [name] more comfortable sitting upright and has worsening shortness of breath when he lies flat.  Denies black or bloody stool. He has been compliant with his Brilinta and warfarin.     Past Medical History:  Diagnosis Date  . Arthritis    "probably in his legs" (08/15/2016)  . Asthma   . Blind right eye   . BPH (benign prostatic hyperplasia)   . Breast asymmetry    Left breast is larger, present for several years.  [name] CAD (coronary artery disease)    a. Cath in the late 90's - reportedly ok;  b. 2014 s/p stenting x 2 @ [facility]; c 08/19/16 Cath/PCI with DES -> RCA, plan to treat LM 70% medically. Seen by surgery and felt to be too high risk for CABG.  . Chronic combined systolic (congestive) and diastolic (congestive) heart failure ([place])    a. Previously reduced EF-->50% by echo in 2012;  b. 06/2015 Echo: EF 50-55%  c. 07/2016 Echo: EF 45-50%.  . CKD (chronic kidney disease), stage III ([place])   . CML (chronic myelocytic leukemia) ([place])    . GERD (gastroesophageal reflux disease)   . Gout   . Hyperlipemia   . Hypertension   . Hypothyroidism   . Iron deficiency anemia   . Migraine    "in the 1960s" (08/15/2016)  . NICM (nonischemic cardiomyopathy) ([place])    a. Previously reduced EF-->50% by echo in 2012;  b. 06/2015 Echo: EF 50-55%, Gr2 DD, mild MR, mildly dil LA, Ao sclerosis, mild TR;  c. 07/2016 Echo: EF 45-50%, ? inf HK, mild MR, mildly dil LA.  [name] PAF (paroxysmal atrial fibrillation) ([facility])    a. ? Dx 2014-->s/p DCCV;  b. CHA2DS2VASc = 6-->chronic coumadin.  [name] RBBB   . Type II diabetes mellitus [facility])      Patient Active Problem List   Diagnosis Date Noted  . Fatigue 12/18/2016  . Stable angina ([place]) 09/20/2016  . CKD (chronic kidney disease), stage III ([place]) 09/20/2016  . PAF (paroxysmal atrial fibrillation) ([place]) 08/28/2016  . Long term (current) use of anticoagulants [Z79.01] 08/28/2016  . Hypertension 08/20/2016  . CAD in native artery 08/19/2016  . Unstable angina ([place]) 08/15/2016  . Acute systolic congestive heart failure ([place])   . Shortness of breath 08/12/2016  . CML (chronic myelocytic leukemia) ([place]) 12/29/2015  . Acute renal failure (ARF) ([place]) 12/08/2015     Past Surgical History:  Procedure Laterality Date  . CATARACT EXTRACTION W/ INTRAOCULAR LENS  IMPLANT, BILATERAL Bilateral   . CORONARY ANGIOPLASTY  WITH STENT PLACEMENT  09/2012   2 stents  . CORONARY STENT INTERVENTION N/A 08/19/2016   Procedure: Coronary Stent Intervention;  Surgeon: [name], MD;  Location: [place] CV LAB;  Service: Cardiovascular;  Laterality: N/A;  . HERNIA REPAIR     "navel"  . LEFT HEART CATH AND CORONARY ANGIOGRAPHY N/A 08/14/2016   Procedure: Left Heart Cath and Coronary Angiography;  Surgeon: [name], MD;  Location: [place] CV LAB;  Service: Cardiovascular;  Laterality: N/A;  . TOE AMPUTATION Right    "big toe"     Prior to Admission medications   Medication Sig Start Date End Date  Taking? Authorizing Provider  acetaminophen (TYLENOL) 500 MG tablet Take 500 mg by mouth every 6 (six) hours as needed.   Yes [provider]  albuterol (PROVENTIL HFA;VENTOLIN HFA) 108 (90 BASE) MCG/ACT inhaler Inhale 2 puffs into the lungs every 6 (six) hours as needed for wheezing or shortness of breath. 11/04/14  Yes Ahmed Prima, MD  atorvastatin (LIPITOR) 80 MG tablet Take 1 tablet (80 mg total) by mouth daily at 6 PM. 08/20/16  Yes Cheryln Manly, NP  bosutinib (BOSULIF) 100 MG tablet Take 200 mg by mouth at bedtime. Take with food.    Yes [provider]  digoxin (LANOXIN) 0.125 MG tablet Take 125 mcg by mouth daily.  11/10/15  Yes [provider]  enalapril (VASOTEC) 5 MG tablet Take 5 mg by mouth daily.   Yes [provider]  famotidine (PEPCID) 20 MG tablet Take 20 mg by mouth at bedtime as needed for heartburn. Take 2 hours after chemo pill.   Yes [provider]  fenofibrate 160 MG tablet Take 1 tablet (160 mg total) by mouth daily. 08/21/16  Yes Reino Bellis B, NP  furosemide (LASIX) 20 MG tablet Take 20 mg by mouth daily.   Yes [provider]  hydrocortisone 2.5 % ointment Apply topically 3 (three) times daily. 07/20/16  Yes Triplett, Cari B, FNP  isosorbide mononitrate (IMDUR) 30 MG 24 hr tablet Take 1 tablet (30 mg total) by mouth daily. 09/21/16  Yes Sainani, Belia Heman, MD  LANTUS SOLOSTAR 100 UNIT/ML Solostar Pen Inject 20 Units into the skin at bedtime. Patient taking differently: Inject 65 Units into the skin at bedtime.  12/11/15  Yes Demetrios Loll, MD  levothyroxine (SYNTHROID, LEVOTHROID) 25 MCG tablet Take 25 mcg by mouth daily before breakfast.  10/27/15  Yes [provider]  loperamide (IMODIUM A-D) 2 MG tablet Take 2 mg by mouth 3 (three) times daily as needed for diarrhea or loose stools.    Yes [provider]  magnesium oxide (MAG-OX) 400 MG tablet Take 400 mg by mouth 2 (two) times daily.   Yes  [provider]  metoprolol succinate (TOPROL-XL) 50 MG 24 hr tablet Take 50 mg by mouth every morning. 12/04/15  Yes [provider]  Multiple Vitamins-Minerals (MULTIVITAMIN ADULTS 50+ PO) Take 1 tablet by mouth every morning.   Yes [provider]  nepafenac (NEVANAC) 0.1 % ophthalmic suspension Place 1 drop into both eyes 3 (three) times daily as needed.   Yes [provider]  nitroGLYCERIN (NITROSTAT) 0.4 MG SL tablet Place 1 tablet (0.4 mg total) under the tongue every 5 (five) minutes x 3 doses as needed for chest pain. 10/02/16  Yes End, [name], MD  pregabalin (LYRICA) 50 MG capsule Take 50 mg by mouth 3 (three) times daily.   Yes [provider]  ranitidine (  ZANTAC) 300 MG tablet Take 300 mg by mouth daily.   Yes [provider]  Spacer/Aero-Holding Chambers [facility] ADVANTAGE) MISC 1 each by Other route once. 11/04/14  Yes Ahmed Prima, MD  ticagrelor (BRILINTA) 90 MG TABS tablet Take 1 tablet (90 mg total) by mouth 2 (two) times daily. 10/02/16  Yes End, [name], MD  warfarin (COUMADIN) 2 MG tablet Take 3 mg by mouth at bedtime.    Yes [provider]  allopurinol (ZYLOPRIM) 300 MG tablet Take 300 mg by mouth daily.  11/10/15   [provider]     Allergies Clopidogrel; Ciprofloxacin; Spironolactone; and Plavix [clopidogrel bisulfate]   Family History  Problem Relation Age of Onset  . Brain cancer Father   . Diabetes Sister   . Heart attack Sister     Social History Social History  Substance Use Topics  . Smoking status: Former Smoker    Packs/day: 2.00    Years: 15.00    Types: Cigarettes  . Smokeless tobacco: Never Used     Comment: quit 1979  . Alcohol use No     Comment: previously drank heavily - quit 1979.    Review of Systems  Constitutional:   No fever or chills.  ENT:   No sore throat. No rhinorrhea. Cardiovascular:   No chest pain or syncope. Respiratory:   positive  shortness of breath, no cough. Gastrointestinal:   Negative for abdominal pain, vomiting and diarrhea.  Musculoskeletal:   Negative for focal pain or swelling All other systems reviewed and are negative except as documented above in ROS and HPI.  ____________________________________________   PHYSICAL EXAM:  VITAL SIGNS: ED Triage Vitals  Enc Vitals Group     BP 12/18/16 1352 114/75     Pulse Rate 12/18/16 1352 66     Resp 12/18/16 1352 20     Temp 12/18/16 1352 98.5 F (36.9 C)     Temp Source 12/18/16 1352 Oral     SpO2 12/18/16 1352 98 %     Weight 12/18/16 1351 196 lb (88.9 kg)     Height 12/18/16 1351 5\' 6"  (1.676 m)     Head Circumference --      Peak Flow --      Pain Score --      Pain Loc --      Pain Edu? --      Excl. in [place]? --     Vital signs reviewed, nursing assessments reviewed.   Constitutional:   Alert and oriented. not in distress. Eyes:   No scleral icterus.  EOMI. No nystagmus. No conjunctival pallor. PERRL. ENT   Head:   Normocephalic and atraumatic.   Nose:   No congestion/rhinnorhea.    Mouth/Throat:   MMM, no pharyngeal erythema. No peritonsillar mass.    Neck:   No meningismus. Full ROM. No JVD Hematological/Lymphatic/Immunilogical:   No cervical lymphadenopathy. Cardiovascular:   bradycardia heart rate 55. Symmetric bilateral radial and DP pulses.  No murmurs.  Respiratory:   Normal respiratory effort without tachypnea/retractions. Breath sounds are clear and equal bilaterally. No wheezes/rales/rhonchi. Gastrointestinal:   Soft and nontender. Non distended. There is no CVA tenderness.  No rebound, rigidity, or guarding.rectal exam shows brown stool, faintly Hemoccult positive, controls okay Genitourinary:   deferred Musculoskeletal:   Normal range of motion in all extremities. No joint effusions.  No lower extremity tenderness.  No edema. Neurologic:   Normal speech and language.  Motor grossly intact. No gross focal neurologic  deficits are appreciated.  Skin:    Skin is warm, dry and intact. No rash noted.  No petechiae, purpura, or bullae.  ____________________________________________    LABS (pertinent positives/negatives) (all labs ordered are listed, but only abnormal results are displayed) Labs Reviewed  CBC - Abnormal; Notable for the following:       Result Value   RBC 3.25 (*)    Hemoglobin 8.3 (*)    HCT 25.1 (*)    MCV 77.3 (*)    MCH 25.6 (*)    RDW 16.7 (*)    All other components within normal limits  BASIC METABOLIC PANEL - Abnormal; Notable for the following:    Sodium 133 (*)    Glucose, Bld 359 (*)    BUN 35 (*)    Creatinine, Ser 2.40 (*)    GFR calc non Af Amer 24 (*)    GFR calc Af Amer 28 (*)    All other components within normal limits  PROTIME-INR - Abnormal; Notable for the following:    Prothrombin Time 26.0 (*)    All other components within normal limits  TROPONIN I  BRAIN NATRIURETIC PEPTIDE  DIGOXIN LEVEL  URINALYSIS, COMPLETE (UACMP) WITH MICROSCOPIC  TROPONIN I  TSH   ____________________________________________   EKG  interpreted by me Sinus rhythm rate of 65, left axis, right bundle-branch block. No acute ischemic changes.  ____________________________________________    RADIOLOGY  Dg Chest 2 View  Result Date: 12/18/2016 CLINICAL DATA:  Increasing shortness of breath over the last 4 months. History of coronary artery disease with stent placement, chronic kidney disease and hypertension. EXAM: CHEST  2 VIEW COMPARISON:  09/19/2016 and 08/16/2016. FINDINGS: The heart size and mediastinal contours are stable. There is atherosclerosis of the aorta. A coronary stent is noted on the lateral view. Stable borderline interstitial coarsening on the frontal examination without corresponding finding on the lateral view. There is no edema, confluent airspace opacity or pleural effusion. The bones appear unchanged. IMPRESSION: Stable radiographic appearance of the  chest. No acute cardiopulmonary process. Electronically Signed   By: Richardean Sale M.D.   On: 12/18/2016 14:39    ____________________________________________   PROCEDURES Procedures  ____________________________________________   INITIAL IMPRESSION / ASSESSMENT AND PLAN / ED COURSE  Pertinent labs & imaging results that were available during my care of the patient were reviewed by me and considered in my medical decision making (see chart for details).  patient presents with worsening dyspnea on exertion. Differential includes ACS with worsening anginal equivalent, dehydration, symptomatic anemia. Low suspicion for PE or pneumonia. No evidence of pneumothorax or carditis.  initial labs unremarkable except for a hemoglobin of 8.3 and acute on chronic renal insufficiency. The patient does clinically appear dehydrated, so I gave him 500 mL's of saline bolus. If he is relatively hemoconcentrated, his true hemoglobin is likely less than 8, with a baseline of almost 10. This is likely the source of his symptoms with symptomatic anemia. Case discussed with Dr. Rockey Situ of cardiology, does not feel the patient warrants any further cardiac investigation at this time other than serial troponins. Case discussed with the hospitalist for further evaluation, care for hydration, consideration of packed red cell transfusion given the patient's severe underlying cardiac disease, symptomatic anemia that could be indicative of low-level demand ischemia.      ____________________________________________   FINAL CLINICAL IMPRESSION(S) / ED DIAGNOSES  Final diagnoses:  Symptomatic anemia  Dyspnea on exertion  Dehydration      New Prescriptions  No medications on file     Portions of this note were generated with dragon dictation software. Dictation errors may occur despite best attempts at proofreading.    Carrie Mew, MD 12/18/16 [phone]

## 2016-12-18 NOTE — ED Notes (Signed)
Attempted to call report; RN unavailable at this time.   

## 2016-12-18 NOTE — ED Notes (Signed)
Pt transported to room 105

## 2016-12-18 NOTE — ED Triage Notes (Signed)
Pt reports SOB since June 2018 when he had stents placed in heart. Pt reports increasing SOB over the past month that was worse yesterday and today. Pt ambulatory to triage. Speaking in complete sentences without difficulty. Pt has some increased work of breath. Room air SpO2 98%. No apparent distress noted.

## 2016-12-18 NOTE — H&P (Signed)
[facility] [place] NAME: Lucas Caldwell    MR#:  [id number]  DATE OF BIRTH:  1934/02/14  DATE OF ADMISSION:  12/18/2016  PRIMARY CARE PHYSICIAN: Juanell Fairly, MD   REQUESTING/REFERRING PHYSICIAN: Joni Fears   CHIEF COMPLAINT:   Chief Complaint  Patient presents with  . Shortness of Breath    HISTORY OF PRESENT ILLNESS: Lucas Caldwell  is a 83 y.o. male with a known history of Asthma, BPH, CAD, stent in June 2018, CML. CKD stage 3, Htn, HLD, Hypothyroidism, DM- for last 1-2 months have difficult in swallowing food, which is progressed now, with liquids too for last 2-3 weeks. Also have c/o generalized weakness, fatigue and SOB with minimal exertion. In ER Noted to have slight drop in Hb, and mild positive Guiac per ER physician. Ac worsening in renal func. Xray chest is clear , no signs of CHF. ER spoke to cardiologist and gave to [place] for admission for fatigue and anemia and renal failure.  PAST MEDICAL HISTORY:   Past Medical History:  Diagnosis Date  . Arthritis    "probably in his legs" (08/15/2016)  . Asthma   . Blind right eye   . BPH (benign prostatic hyperplasia)   . Breast asymmetry    Left breast is larger, present for several years.  [name] CAD (coronary artery disease)    a. Cath in the late 90's - reportedly ok;  b. 2014 s/p stenting x 2 @ [facility]; c 08/19/16 Cath/PCI with DES -> RCA, plan to treat LM 70% medically. Seen by surgery and felt to be too high risk for CABG.  . Chronic combined systolic (congestive) and diastolic (congestive) heart failure ([place])    a. Previously reduced EF-->50% by echo in 2012;  b. 06/2015 Echo: EF 50-55%  c. 07/2016 Echo: EF 45-50%.  . CKD (chronic kidney disease), stage III ([place])   . CML (chronic myelocytic leukemia) ([place])   . GERD (gastroesophageal reflux disease)   . Gout   . Hyperlipemia   . Hypertension   . Hypothyroidism   . Iron deficiency anemia   . Migraine    "in the 1960s" (08/15/2016)  .  NICM (nonischemic cardiomyopathy) ([place])    a. Previously reduced EF-->50% by echo in 2012;  b. 06/2015 Echo: EF 50-55%, Gr2 DD, mild MR, mildly dil LA, Ao sclerosis, mild TR;  c. 07/2016 Echo: EF 45-50%, ? inf HK, mild MR, mildly dil LA.  [name] PAF (paroxysmal atrial fibrillation) ([facility])    a. ? Dx 2014-->s/p DCCV;  b. CHA2DS2VASc = 6-->chronic coumadin.  [name] RBBB   . Type II diabetes mellitus ([place])     PAST SURGICAL HISTORY: Past Surgical History:  Procedure Laterality Date  . CATARACT EXTRACTION W/ INTRAOCULAR LENS  IMPLANT, BILATERAL Bilateral   . CORONARY ANGIOPLASTY WITH STENT PLACEMENT  09/2012   2 stents  . CORONARY STENT INTERVENTION N/A 08/19/2016   Procedure: Coronary Stent Intervention;  Surgeon: [name], MD;  Location: [facility];  Service: Cardiovascular;  Laterality: N/A;  . HERNIA REPAIR     "navel"  . LEFT HEART CATH AND CORONARY ANGIOGRAPHY N/A 08/14/2016   Procedure: Left Heart Cath and Coronary Angiography;  Surgeon: [name], MD;  Location: [facility];  Service: Cardiovascular;  Laterality: N/A;  . TOE AMPUTATION Right    "big toe"    SOCIAL HISTORY:  Social History  Substance Use Topics  . Smoking status: Former Smoker    Packs/day:  2.00    Years: 15.00    Types: Cigarettes  . Smokeless tobacco: Never Used     Comment: quit 1979  . Alcohol use No     Comment: previously drank heavily - quit 1979.    FAMILY HISTORY:  Family History  Problem Relation Age of Onset  . Brain cancer Father   . Diabetes Sister   . Heart attack Sister     DRUG ALLERGIES:  Allergies  Allergen Reactions  . Clopidogrel Anaphylaxis    Other reaction(s): ANAPHYLAXIS;altered mental status;  (electrolytes "out of wack" and confusion per family; THEY DO NOT REMEMBER ANY OTHER REACTION)- MSB 10/10/15  . Ciprofloxacin Itching  . Spironolactone     Other reaction(s): Other (See Comments) gynecomastia  . Plavix [Clopidogrel Bisulfate] Anxiety    REVIEW  OF SYSTEMS:   CONSTITUTIONAL: No fever,positive for fatigue or weakness.  EYES: No blurred or double vision.  EARS, NOSE, AND THROAT: No tinnitus or ear pain.  RESPIRATORY: No cough, shortness of breath, wheezing or hemoptysis.  CARDIOVASCULAR: No chest pain, orthopnea, edema.  GASTROINTESTINAL: No nausea, vomiting, diarrhea or abdominal pain.  GENITOURINARY: No dysuria, hematuria.  ENDOCRINE: No polyuria, nocturia,  HEMATOLOGY: No anemia, easy bruising or bleeding SKIN: No rash or lesion. MUSCULOSKELETAL: No joint pain or arthritis.   NEUROLOGIC: No tingling, numbness, weakness.  PSYCHIATRY: No anxiety or depression.   MEDICATIONS AT HOME:  Prior to Admission medications   Medication Sig Start Date End Date Taking? Authorizing Provider  acetaminophen (TYLENOL) 500 MG tablet Take 500 mg by mouth every 6 (six) hours as needed.   Yes [provider]  albuterol (PROVENTIL HFA;VENTOLIN HFA) 108 (90 BASE) MCG/ACT inhaler Inhale 2 puffs into the lungs every 6 (six) hours as needed for wheezing or shortness of breath. 11/04/14  Yes Ahmed Prima, MD  atorvastatin (LIPITOR) 80 MG tablet Take 1 tablet (80 mg total) by mouth daily at 6 PM. 08/20/16  Yes Cheryln Manly, NP  bosutinib (BOSULIF) 100 MG tablet Take 200 mg by mouth at bedtime. Take with food.    Yes [provider]  digoxin (LANOXIN) 0.125 MG tablet Take 125 mcg by mouth daily.  11/10/15  Yes [provider]  enalapril (VASOTEC) 5 MG tablet Take 5 mg by mouth daily.   Yes [provider]  famotidine (PEPCID) 20 MG tablet Take 20 mg by mouth at bedtime as needed for heartburn. Take 2 hours after chemo pill.   Yes [provider]  fenofibrate 160 MG tablet Take 1 tablet (160 mg total) by mouth daily. 08/21/16  Yes Reino Bellis B, NP  furosemide (LASIX) 20 MG tablet Take 20 mg by mouth daily.   Yes [provider]  hydrocortisone 2.5 % ointment Apply topically 3 (three) times  daily. 07/20/16  Yes Triplett, Cari B, FNP  isosorbide mononitrate (IMDUR) 30 MG 24 hr tablet Take 1 tablet (30 mg total) by mouth daily. 09/21/16  Yes Sainani, Belia Heman, MD  LANTUS SOLOSTAR 100 UNIT/ML Solostar Pen Inject 20 Units into the skin at bedtime. Patient taking differently: Inject 65 Units into the skin at bedtime.  12/11/15  Yes Demetrios Loll, MD  levothyroxine (SYNTHROID, LEVOTHROID) 25 MCG tablet Take 25 mcg by mouth daily before breakfast.  10/27/15  Yes [provider]  loperamide (IMODIUM A-D) 2 MG tablet Take 2 mg by mouth 3 (three) times daily as needed for diarrhea or loose stools.    Yes [provider]  magnesium oxide (MAG-OX)  400 MG tablet Take 400 mg by mouth 2 (two) times daily.   Yes [provider]  metoprolol succinate (TOPROL-XL) 50 MG 24 hr tablet Take 50 mg by mouth every morning. 12/04/15  Yes [provider]  Multiple Vitamins-Minerals (MULTIVITAMIN ADULTS 50+ PO) Take 1 tablet by mouth every morning.   Yes [provider]  nepafenac (NEVANAC) 0.1 % ophthalmic suspension Place 1 drop into both eyes 3 (three) times daily as needed.   Yes [provider]  nitroGLYCERIN (NITROSTAT) 0.4 MG SL tablet Place 1 tablet (0.4 mg total) under the tongue every 5 (five) minutes x 3 doses as needed for chest pain. 10/02/16  Yes End, [name], MD  pregabalin (LYRICA) 50 MG capsule Take 50 mg by mouth 3 (three) times daily.   Yes [provider]  ranitidine (ZANTAC) 300 MG tablet Take 300 mg by mouth daily.   Yes [provider]  Spacer/Aero-Holding [facility] ADVANTAGE) MISC 1 each by Other route once. 11/04/14  Yes Ahmed Prima, MD  ticagrelor (BRILINTA) 90 MG TABS tablet Take 1 tablet (90 mg total) by mouth 2 (two) times daily. 10/02/16  Yes End, [name], MD  warfarin (COUMADIN) 2 MG tablet Take 3 mg by mouth at bedtime.    Yes [provider]  allopurinol (ZYLOPRIM) 300 MG tablet Take 300 mg  by mouth daily.  11/10/15   [provider]      PHYSICAL EXAMINATION:   VITAL SIGNS: Blood pressure (!) 126/57, pulse (!) 55, temperature 98.5 F (36.9 C), temperature source Oral, resp. rate (!) 23, height 5\' 6"  (1.676 m), weight 88.9 kg (196 lb), SpO2 100 %.  GENERAL:  83 y.o.-year-old patient lying in the bed with no acute distress.  EYES: Pupils equal, round, reactive to light and accommodation. No scleral icterus. Extraocular muscles intact.  HEENT: Head atraumatic, normocephalic. Oropharynx and nasopharynx clear.  NECK:  Supple, no jugular venous distention. No thyroid enlargement, no tenderness.  LUNGS: Normal breath sounds bilaterally, no wheezing, rales,rhonchi or crepitation. No use of accessory muscles of respiration.  CARDIOVASCULAR: S1, S2 normal. No murmurs, rubs, or gallops.  ABDOMEN: Soft, nontender, nondistended. Bowel sounds present. No organomegaly or mass.  EXTREMITIES: No pedal edema, cyanosis, or clubbing.  NEUROLOGIC: Cranial nerves II through XII are intact. Muscle strength 4-5/5 in all extremities. Sensation intact. Gait not checked.  PSYCHIATRIC: The patient is alert and oriented x 3.  SKIN: No obvious rash, lesion, or ulcer.   LABORATORY PANEL:   CBC  Recent Labs Lab 12/18/16 1456  WBC 8.3  HGB 8.3*  HCT 25.1*  PLT 227  MCV 77.3*  MCH 25.6*  MCHC 33.0  RDW 16.7*   ------------------------------------------------------------------------------------------------------------------  Chemistries   Recent Labs Lab 12/18/16 1456  NA 133*  K 4.5  CL 101  CO2 25  GLUCOSE 359*  BUN 35*  CREATININE 2.40*  CALCIUM 8.9   ------------------------------------------------------------------------------------------------------------------ estimated creatinine clearance is 25.2 mL/min (A) (by C-G formula based on SCr of 2.4 mg/dL  (H)). ------------------------------------------------------------------------------------------------------------------ No results for input(s): TSH, T4TOTAL, T3FREE, THYROIDAB in the last 72 hours.  Invalid input(s): FREET3   Coagulation profile  Recent Labs Lab 12/18/16 1456  INR 2.40   ------------------------------------------------------------------------------------------------------------------- No results for input(s): DDIMER in the last 72 hours. -------------------------------------------------------------------------------------------------------------------  Cardiac Enzymes  Recent Labs Lab 12/18/16 1456  TROPONINI <0.03   ------------------------------------------------------------------------------------------------------------------ Invalid input(s): POCBNP  ---------------------------------------------------------------------------------------------------------------  Urinalysis    Component Value Date/Time   COLORURINE STRAW (A) 12/08/2015 1950   APPEARANCEUR  CLEAR (A) 12/08/2015 1950   APPEARANCEUR Clear 05/17/2012 1442   LABSPEC 1.006 12/08/2015 1950   LABSPEC 1.010 05/17/2012 1442   PHURINE 5.0 12/08/2015 1950   GLUCOSEU NEGATIVE 12/08/2015 1950   GLUCOSEU >=500 05/17/2012 1442   HGBUR NEGATIVE 12/08/2015 1950   BILIRUBINUR NEGATIVE 12/08/2015 1950   [place] Negative 05/17/2012 [place] 12/08/2015 1950   PROTEINUR NEGATIVE 12/08/2015 1950   NITRITE NEGATIVE 12/08/2015 1950   LEUKOCYTESUR NEGATIVE 12/08/2015 1950   LEUKOCYTESUR Negative 05/17/2012 1442     RADIOLOGY: Dg Chest 2 View  Result Date: 12/18/2016 CLINICAL DATA:  Increasing shortness of breath over the last 4 months. History of coronary artery disease with stent placement, chronic kidney disease and hypertension. EXAM: CHEST  2 VIEW COMPARISON:  09/19/2016 and 08/16/2016. FINDINGS: The heart size and mediastinal contours are stable. There is atherosclerosis of  the aorta. A coronary stent is noted on the lateral view. Stable borderline interstitial coarsening on the frontal examination without corresponding finding on the lateral view. There is no edema, confluent airspace opacity or pleural effusion. The bones appear unchanged. IMPRESSION: Stable radiographic appearance of the chest. No acute cardiopulmonary process. Electronically Signed   By: Richardean Sale M.D.   On: 12/18/2016 14:39    EKG: Orders placed or performed during the hospital encounter of 12/18/16  . ED EKG  . ED EKG    IMPRESSION AND PLAN:  * Ac on Ch renal failure stage 3   IV fluids, monitor.    Watch for fluid overload.    Hold Diuretics, Htn meds and ACE inhibitors.  * Ac on ch anemia   Have Hx of CML and on oral agent.   Now fatigue and swallowing difficulty   Oncology consult.   No need for transfusion, but as have Hx of CAD and stent- may transfuse < 8.  * Dysphagia    First solid and now with liquids also   Barium swallow.   GI consult.   Due to recent cath , stent- may not be able to stop Brillianta- and GI need to decide- if need endoscopy or not.  * CAD   Cont cardiac meds except Metoprolol  * bradycardia    HR around 50, c/o fatigue    Hold metoprolol for now.    Check TSH.    Repeat Echo, cardio consult.  * DM   Decrease lantus due to decreased intake.    ISS.    Hold oral agents.   All the records are reviewed and case discussed with ED provider. Management plans discussed with the patient, family and they are in agreement.  CODE STATUS: Full. Code Status History    Date Active Date Inactive Code Status Order ID Comments User Context   09/19/2016  5:00 PM 09/20/2016  8:59 PM Full Code [id number]  Dustin Flock, MD Inpatient   08/15/2016  4:49 PM 08/20/2016  7:29 PM Full Code [id number]  Rogelia Mire, NP Inpatient   08/12/2016  2:08 PM 08/15/2016  2:05 PM Full Code [id number]  Baxter Hire, MD Inpatient   12/08/2015  8:21 PM 12/11/2015  1:15  PM Full Code [id number]  Epifanio Lesches, MD ED     Wife in room, during my visit.  TOTAL TIME TAKING CARE OF THIS PATIENT: 35 minutes.    Vaughan Basta M.D on 12/18/2016   Between 7am to 6pm - Pager - [phone]  After 6pm go to [email or web address] - password [email or web address]  [employer]  Office  [phone]  CC: Primary care physician; Juanell Fairly, MD   Note: This dictation was prepared with Dragon dictation along with smaller phrase technology. Any transcriptional errors that result from this process are unintentional.

## 2016-12-18 NOTE — ED Notes (Signed)
This nurse tried calling report to 1C again.  Retail banker and receiving RN are in a rapid at this time.

## 2016-12-19 ENCOUNTER — Inpatient Hospital Stay: Payer: Medicare Other

## 2016-12-19 DIAGNOSIS — I2510 Atherosclerotic heart disease of native coronary artery without angina pectoris: Secondary | ICD-10-CM

## 2016-12-19 DIAGNOSIS — Z87891 Personal history of nicotine dependence: Secondary | ICD-10-CM

## 2016-12-19 DIAGNOSIS — N183 Chronic kidney disease, stage 3 (moderate): Secondary | ICD-10-CM

## 2016-12-19 DIAGNOSIS — K219 Gastro-esophageal reflux disease without esophagitis: Secondary | ICD-10-CM | POA: Diagnosis not present

## 2016-12-19 DIAGNOSIS — I129 Hypertensive chronic kidney disease with stage 1 through stage 4 chronic kidney disease, or unspecified chronic kidney disease: Secondary | ICD-10-CM

## 2016-12-19 DIAGNOSIS — E860 Dehydration: Secondary | ICD-10-CM | POA: Diagnosis not present

## 2016-12-19 DIAGNOSIS — R1310 Dysphagia, unspecified: Secondary | ICD-10-CM

## 2016-12-19 DIAGNOSIS — K222 Esophageal obstruction: Secondary | ICD-10-CM | POA: Diagnosis not present

## 2016-12-19 DIAGNOSIS — N179 Acute kidney failure, unspecified: Secondary | ICD-10-CM | POA: Diagnosis not present

## 2016-12-19 DIAGNOSIS — I25709 Atherosclerosis of coronary artery bypass graft(s), unspecified, with unspecified angina pectoris: Secondary | ICD-10-CM

## 2016-12-19 DIAGNOSIS — M1990 Unspecified osteoarthritis, unspecified site: Secondary | ICD-10-CM | POA: Diagnosis not present

## 2016-12-19 DIAGNOSIS — N400 Enlarged prostate without lower urinary tract symptoms: Secondary | ICD-10-CM | POA: Diagnosis not present

## 2016-12-19 DIAGNOSIS — C9211 Chronic myeloid leukemia, BCR/ABL-positive, in remission: Secondary | ICD-10-CM | POA: Diagnosis not present

## 2016-12-19 DIAGNOSIS — M109 Gout, unspecified: Secondary | ICD-10-CM

## 2016-12-19 DIAGNOSIS — Z79899 Other long term (current) drug therapy: Secondary | ICD-10-CM

## 2016-12-19 DIAGNOSIS — I480 Paroxysmal atrial fibrillation: Secondary | ICD-10-CM | POA: Diagnosis not present

## 2016-12-19 DIAGNOSIS — E785 Hyperlipidemia, unspecified: Secondary | ICD-10-CM | POA: Diagnosis not present

## 2016-12-19 DIAGNOSIS — D649 Anemia, unspecified: Secondary | ICD-10-CM

## 2016-12-19 DIAGNOSIS — E119 Type 2 diabetes mellitus without complications: Secondary | ICD-10-CM

## 2016-12-19 DIAGNOSIS — K3189 Other diseases of stomach and duodenum: Secondary | ICD-10-CM

## 2016-12-19 LAB — CBC
HEMATOCRIT: 25.5 % — AB (ref 40.0–52.0)
HEMOGLOBIN: 8.7 g/dL — AB (ref 13.0–18.0)
MCH: 26.1 pg (ref 26.0–34.0)
MCHC: 34.1 g/dL (ref 32.0–36.0)
MCV: 76.4 fL — ABNORMAL LOW (ref 80.0–100.0)
Platelets: 236 10*3/uL (ref 150–440)
RBC: 3.34 MIL/uL — ABNORMAL LOW (ref 4.40–5.90)
RDW: 16.9 % — ABNORMAL HIGH (ref 11.5–14.5)
WBC: 8.2 10*3/uL (ref 3.8–10.6)

## 2016-12-19 LAB — GLUCOSE, CAPILLARY
GLUCOSE-CAPILLARY: 222 mg/dL — AB (ref 65–99)
GLUCOSE-CAPILLARY: 254 mg/dL — AB (ref 65–99)
Glucose-Capillary: 243 mg/dL — ABNORMAL HIGH (ref 65–99)
Glucose-Capillary: 261 mg/dL — ABNORMAL HIGH (ref 65–99)
Glucose-Capillary: 278 mg/dL — ABNORMAL HIGH (ref 65–99)

## 2016-12-19 LAB — IRON AND TIBC
Iron: 15 ug/dL — ABNORMAL LOW (ref 45–182)
Saturation Ratios: 3 % — ABNORMAL LOW (ref 17.9–39.5)
TIBC: 463 ug/dL — ABNORMAL HIGH (ref 250–450)
UIBC: 448 ug/dL

## 2016-12-19 LAB — BASIC METABOLIC PANEL
ANION GAP: 8 (ref 5–15)
BUN: 31 mg/dL — ABNORMAL HIGH (ref 6–20)
CO2: 24 mmol/L (ref 22–32)
Calcium: 9 mg/dL (ref 8.9–10.3)
Chloride: 105 mmol/L (ref 101–111)
Creatinine, Ser: 2.06 mg/dL — ABNORMAL HIGH (ref 0.61–1.24)
GFR calc Af Amer: 33 mL/min — ABNORMAL LOW (ref >60)
GFR, EST NON AFRICAN AMERICAN: 29 mL/min — AB (ref >60)
GLUCOSE: 239 mg/dL — AB (ref 65–99)
POTASSIUM: 4.2 mmol/L (ref 3.5–5.1)
Sodium: 137 mmol/L (ref 135–145)

## 2016-12-19 LAB — PROTEIN / CREATININE RATIO, URINE
CREATININE, URINE: 96 mg/dL
PROTEIN CREATININE RATIO: 0.2 mg/mg{creat} — AB (ref 0.00–0.15)
TOTAL PROTEIN, URINE: 19 mg/dL

## 2016-12-19 LAB — DIGOXIN LEVEL
Digoxin Level: 1 ng/mL (ref 0.8–2.0)
Digoxin Level: 1 ng/mL (ref 0.8–2.0)

## 2016-12-19 LAB — FERRITIN: Ferritin: 16 ng/mL — ABNORMAL LOW (ref 24–336)

## 2016-12-19 MED ORDER — INSULIN ASPART 100 UNIT/ML ~~LOC~~ SOLN: 0.0000 [IU] | Freq: Three times a day (TID) | SUBCUTANEOUS | Status: DC

## 2016-12-19 MED ORDER — INSULIN ASPART 100 UNIT/ML ~~LOC~~ SOLN
0.0000 [IU] | Freq: Every day | SUBCUTANEOUS | Status: DC
  Administered 2016-12-19: 21:00:00 3 [IU] via SUBCUTANEOUS
  Filled 2016-12-19: qty 1

## 2016-12-19 MED ORDER — WARFARIN - PHARMACIST DOSING INPATIENT
Freq: Every day | Status: DC
  Administered 2016-12-19: 19:00:00

## 2016-12-19 MED ORDER — WARFARIN SODIUM 3 MG PO TABS
3.0000 mg | ORAL_TABLET | Freq: Every day | ORAL | Status: DC
  Administered 2016-12-19: 3 mg via ORAL
  Filled 2016-12-19 (×2): qty 1

## 2016-12-19 MED ORDER — PANTOPRAZOLE SODIUM 40 MG PO TBEC
40.0000 mg | DELAYED_RELEASE_TABLET | Freq: Every day | ORAL | Status: DC
  Administered 2016-12-19 – 2016-12-20 (×2): 40 mg via ORAL
  Filled 2016-12-19 (×2): qty 1

## 2016-12-19 MED ORDER — INSULIN GLARGINE 100 UNIT/ML ~~LOC~~ SOLN
25.0000 [IU] | Freq: Every day | SUBCUTANEOUS | Status: DC
  Administered 2016-12-19: 25 [IU] via SUBCUTANEOUS
  Filled 2016-12-19 (×3): qty 0.25

## 2016-12-19 MED ORDER — ISOSORBIDE MONONITRATE ER 30 MG PO TB24
30.0000 mg | ORAL_TABLET | Freq: Every day | ORAL | Status: DC
  Administered 2016-12-19 – 2016-12-20 (×2): 30 mg via ORAL
  Filled 2016-12-19 (×2): qty 1

## 2016-12-19 NOTE — Consult Note (Signed)
[place]  Telephone:([phone]) [phone] Fax:(336) [phone]  ID: HALTON NEAS OB: December 05, 1933  MR#: [id number]  MLY#:[id number]  Patient Care Team: Juanell Fairly, MD as PCP - General (Pediatrics)  CHIEF COMPLAINT: CML.  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 He currently feels improved from admission. He receives all his treatment at [facility] and this expressed interest in transferring care to [facility] to be closer to home. She has no neurologic complaints. He denies any fevers. He continues to have persistent weakness and fatigue. He denies any chest pain or cough. He denies any nausea, vomiting, constipation, or diarrhea. He has no urinary complaints. Patient offers no further specific complaints.  REVIEW OF SYSTEMS:   Review of Systems  Constitutional: Positive for malaise/fatigue. Negative for fever and weight loss.  Respiratory: Positive for shortness of breath. Negative for cough.   Cardiovascular: Negative.  Negative for chest pain and leg swelling.  Gastrointestinal: Negative.  Negative for abdominal pain, blood in stool and melena.  Genitourinary: Negative.   Musculoskeletal: Negative.   Skin: Negative.  Negative for rash.  Neurological: Positive for weakness.  Psychiatric/Behavioral: Negative.  The patient is not nervous/anxious.     As per HPI. Otherwise, a complete review of systems is negative.  PAST MEDICAL HISTORY: Past Medical History:  Diagnosis Date  . Arthritis    "probably in his legs" (08/15/2016)  . Asthma   . Blind right eye   . BPH (benign prostatic hyperplasia)   . Breast asymmetry    Left breast is larger, present for several years.  [name] CAD (coronary artery disease)    a. Cath in the late 90's - reportedly ok;  b. 2014 s/p stenting x 2 @ [facility]; c 08/19/16 Cath/PCI with DES -> RCA, plan to treat LM 70% medically.  Seen by surgery and felt to be too high risk for CABG.  . Chronic combined systolic (congestive) and diastolic (congestive) heart failure ([place])    a. Previously reduced EF-->50% by echo in 2012;  b. 06/2015 Echo: EF 50-55%  c. 07/2016 Echo: EF 45-50%.  . CKD (chronic kidney disease), stage III ([place])   . CML (chronic myelocytic leukemia) ([place])   . GERD (gastroesophageal reflux disease)   . Gout   . Hyperlipemia   . Hypertension   . Hypothyroidism   . Iron deficiency anemia   . Migraine    "in the 1960s" (08/15/2016)  . NICM (nonischemic cardiomyopathy) ([place])    a. Previously reduced EF-->50% by echo in 2012;  b. 06/2015 Echo: EF 50-55%, Gr2 DD, mild MR, mildly dil LA, Ao sclerosis, mild TR;  c. 07/2016 Echo: EF 45-50%, ? inf HK, mild MR, mildly dil LA.  [name] PAF (paroxysmal atrial fibrillation) (H[facility])    a. ? Dx 2014-->s/p DCCV;  b. CHA2DS2VASc = 6-->chronic coumadin.  [name] RBBB   . Type II diabetes mellitus ([place])     PAST SURGICAL HISTORY: Past Surgical History:  Procedure Laterality Date  . CATARACT EXTRACTION W/ INTRAOCULAR LENS  IMPLANT, BILATERAL Bilateral   . CORONARY ANGIOPLASTY WITH STENT PLACEMENT  09/2012   2 stents  . CORONARY STENT INTERVENTION N/A 08/19/2016   Procedure: Coronary Stent Intervention;  Surgeon: [name], MD;  Location: [place] [facility];  Service: Cardiovascular;  Laterality: N/A;  . HERNIA REPAIR     "navel"  . LEFT HEART CATH AND CORONARY ANGIOGRAPHY N/A 08/14/2016  Procedure: Left Heart Cath and Coronary Angiography;  Surgeon: [name], MD;  Location: [place] [facility];  Service: Cardiovascular;  Laterality: N/A;  . TOE AMPUTATION Right    "big toe"    FAMILY HISTORY: Family History  Problem Relation Age of Onset  . Brain cancer Father   . Diabetes Sister   . Heart attack Sister     ADVANCED DIRECTIVES (Y/N):  @ADVDIR @  HEALTH MAINTENANCE: Social History  Substance Use Topics  . Smoking status: Former Smoker    Packs/day: 2.00     Years: 15.00    Types: Cigarettes  . Smokeless tobacco: Never Used     Comment: quit 1979  . Alcohol use No     Comment: previously drank heavily - quit 1979.     Colonoscopy:  PAP:  Bone density:  Lipid panel:  Allergies  Allergen Reactions  . Clopidogrel Anaphylaxis    Other reaction(s): ANAPHYLAXIS;altered mental status;  (electrolytes "out of wack" and confusion per family; THEY DO NOT REMEMBER ANY OTHER REACTION)- MSB 10/10/15  . Ciprofloxacin Itching  . Spironolactone     Other reaction(s): Other (See Comments) gynecomastia  . Plavix [Clopidogrel Bisulfate] Anxiety    Current Facility-Administered Medications  Medication Dose Route Frequency Provider Last Rate Last Dose  . 0.9 %  sodium chloride infusion   Intravenous Continuous Vaughan Basta, MD 50 mL/hr at 12/19/16 0058    . acetaminophen (TYLENOL) tablet 500 mg  500 mg Oral Q6H PRN Vaughan Basta, MD      . albuterol (PROVENTIL) (2.5 MG/3ML) 0.083% nebulizer solution 3 mL  3 mL Inhalation Q6H PRN Vaughan Basta, MD      . atorvastatin (LIPITOR) tablet 80 mg  80 mg Oral q1800 Vaughan Basta, MD      . bosutinib (BOSULIF) tablet 200 mg  200 mg Oral QHS Vaughan Basta, MD      . docusate sodium (COLACE) capsule 100 mg  100 mg Oral BID PRN Vaughan Basta, MD      . famotidine (PEPCID) tablet 20 mg  20 mg Oral QHS PRN Vaughan Basta, MD      . fenofibrate tablet 160 mg  160 mg Oral Daily Vaughan Basta, MD   160 mg at 12/19/16 1229  . Influenza vac split quadrivalent PF (FLUZONE HIGH-DOSE) injection 0.5 mL  0.5 mL Intramuscular Tomorrow-1000 Vaughan Basta, MD      . insulin aspart (novoLOG) injection 0-5 Units  0-5 Units Subcutaneous QHS Lenis Noon, [facility]      . insulin aspart (novoLOG) injection 0-9 Units  0-9 Units Subcutaneous TID WC Vaughan Basta, MD   5 Units at 12/19/16 1226  . insulin aspart (novoLOG) injection 0-9 Units  0-9 Units  Subcutaneous TID WC Lenis Noon, RPH      . insulin glargine (LANTUS) injection 25 Units  25 Units Subcutaneous QHS Manuella Ghazi, Vipul, MD      . isosorbide mononitrate (IMDUR) 24 hr tablet 30 mg  30 mg Oral Daily [name] M, PA-C   30 mg at 12/19/16 1225  . levothyroxine (SYNTHROID, LEVOTHROID) tablet 25 mcg  25 mcg Oral QAC breakfast Vaughan Basta, MD   25 mcg at 12/19/16 0802  . loperamide (IMODIUM) capsule 2 mg  2 mg Oral TID PRN Vaughan Basta, MD      . magnesium oxide (MAG-OX) tablet 400 mg  400 mg Oral BID Vaughan Basta, MD   400 mg at 12/19/16 1225  . multivitamin with minerals tablet   Oral Daily  Vaughan Basta, MD   1 tablet at 12/19/16 1225  . pregabalin (LYRICA) capsule 50 mg  50 mg Oral TID Vaughan Basta, MD   50 mg at 12/19/16 1225  . ticagrelor (BRILINTA) tablet 90 mg  90 mg Oral BID Vaughan Basta, MD   90 mg at 12/19/16 1233  . warfarin (COUMADIN) tablet 3 mg  3 mg Oral q1800 Lenis Noon, [facility]      . Warfarin - Pharmacist Dosing Inpatient   Does not apply q1800 Lenis Noon, [facility]        OBJECTIVE: Vitals:   12/19/16 0749 12/19/16 1337  BP: (!) 150/60 (!) 138/57  Pulse: (!) 57 (!) 56  Resp: 20 18  Temp: 98.1 F (36.7 C) 98.3 F (36.8 C)  SpO2: 100% 99%     Body mass index is 31.65 kg/m.    ECOG FS:1 - Symptomatic but completely ambulatory  General: Well-developed, well-nourished, no acute distress. Eyes: Pink conjunctiva, anicteric sclera. HEENT: Normocephalic, moist mucous membranes, clear oropharnyx. Lungs: Clear to auscultation bilaterally. Heart: Regular rate and rhythm. No rubs, murmurs, or gallops. Abdomen: Soft, nontender, nondistended. No organomegaly noted, normoactive bowel sounds. Musculoskeletal: No edema, cyanosis, or clubbing. Neuro: Alert, answering all questions appropriately. Cranial nerves grossly intact. Skin: No rashes or petechiae noted. Psych: Normal affect. Lymphatics: No cervical,  calvicular, axillary or inguinal LAD.   LAB RESULTS:  Lab Results  Component Value Date   NA 137 12/19/2016   K 4.2 12/19/2016   CL 105 12/19/2016   CO2 24 12/19/2016   GLUCOSE 239 (H) 12/19/2016   BUN 31 (H) 12/19/2016   CREATININE 2.06 (H) 12/19/2016   CALCIUM 9.0 12/19/2016   PROT 7.2 10/16/2016   ALBUMIN 3.8 10/16/2016   AST 34 10/16/2016   ALT 21 10/16/2016   ALKPHOS 91 10/16/2016   BILITOT <0.2 10/16/2016   GFRNONAA 29 (L) 12/19/2016   GFRAA 33 (L) 12/19/2016    Lab Results  Component Value Date   WBC 8.2 12/19/2016   NEUTROABS 10.9 (H) 03/16/2012   HGB 8.7 (L) 12/19/2016   HCT 25.5 (L) 12/19/2016   MCV 76.4 (L) 12/19/2016   PLT 236 12/19/2016     STUDIES: Dg Chest 2 View  Result Date: 12/18/2016 CLINICAL DATA:  Increasing shortness of breath over the last 4 months. History of coronary artery disease with stent placement, chronic kidney disease and hypertension. EXAM: CHEST  2 VIEW COMPARISON:  09/19/2016 and 08/16/2016. FINDINGS: The heart size and mediastinal contours are stable. There is atherosclerosis of the aorta. A coronary stent is noted on the lateral view. Stable borderline interstitial coarsening on the frontal examination without corresponding finding on the lateral view. There is no edema, confluent airspace opacity or pleural effusion. The bones appear unchanged. IMPRESSION: Stable radiographic appearance of the chest. No acute cardiopulmonary process. Electronically Signed   By: Richardean Sale M.D.   On: 12/18/2016 14:39   Dg Esophagus  Result Date: 12/19/2016 CLINICAL DATA:  Dysphagia EXAM: ESOPHOGRAM / BARIUM SWALLOW / BARIUM TABLET STUDY TECHNIQUE: Combined double contrast and single contrast examination performed using effervescent crystals, thick barium liquid, and thin barium liquid. The patient was observed with fluoroscopy swallowing a 13 mm barium sulphate tablet. FLUOROSCOPY TIME:  Fluoroscopy Time:  0.9 minutes Radiation Exposure Index (if  provided by the fluoroscopic device): 11.5 mGy Number of Acquired Spot Images: 0 COMPARISON:  None. FINDINGS: There was normal pharyngeal anatomy and motility. Contrast flowed freely through the esophagus without evidence of stricture or  mass. There was normal esophageal mucosa without evidence of irregularity or ulceration. Tertiary contractions of the distal third of the esophagus as can be seen with esophageal spasm. No evidence of reflux. No definite hiatal hernia was demonstrated. At the end of the examination a 13 mm barium tablet was administered which transited through the esophagus and esophagogastric junction without delay. IMPRESSION: 1. Tertiary contractions of the distal third of the esophagus as can be seen with esophageal spasm. 2. No esophageal stricture. Electronically Signed   By: Kathreen Devoid   On: 12/19/2016 09:00   US Renal  Result Date: 12/19/2016 CLINICAL DATA:  Acute renal failure EXAM: RENAL / URINARY TRACT ULTRASOUND COMPLETE COMPARISON:  06/20/2016 CT abdomen/pelvis. FINDINGS: Right Kidney: Length: 10.3 cm. Echogenicity within normal limits. No mass or hydronephrosis visualized. Incidental diffuse hepatic steatosis in the visualized right liver lobe. Left Kidney: Length: 10.8 cm. Echogenicity within normal limits. No mass or hydronephrosis visualized. Bladder: Appears normal for degree of bladder distention. Bilateral ureteral jets are demonstrated in the bladder. IMPRESSION: 1. Normal ultrasound of the kidneys and bladder, with no hydronephrosis. 2. Incidental diffuse hepatic steatosis. Electronically Signed   By: Ilona Sorrel M.D.   On: 12/19/2016 10:14    ASSESSMENT: CML.  PLAN:    1. CML: Patient has received all of his care at [facility], but has expressed interest in transferring care to [facility]. He was initially placed on Gleevec, but was then transitioned to Bosulif secondary to side effects. By report, his last BCR-ABL indicated a complete remission. Will repeat BCR-ABL today. No  intervention is needed at this time. Patient can follow-up in the [place] 2-3 weeks for further evaluation. 2. Anemia: Mild. Unclear patient's baseline. Continue to monitor maintaining hemoglobin greater than 8.0 given patient's underlying cardiac disease.  Appreciate consult, call with questions.  Lloyd Huger, MD   12/19/2016 2:58 PM

## 2016-12-19 NOTE — Progress Notes (Deleted)
   12/19/16 0700  Clinical Encounter Type  Visited With Patient  Visit Type Initial;Spiritual support  Referral From Nurse  Spiritual Encounters  Spiritual Needs Prayer;Emotional  Patient received cancer diagnosis and RN requested prayer.  [place] met with patient and spoke at length with daughter discussing areas of support and also working with Palliative Care team; [facility] will monitor and visit; spiritual, emotional and grief support offered

## 2016-12-19 NOTE — Evaluation (Signed)
Physical Therapy Evaluation Patient Details Name: Lucas Caldwell MRN: [id number] DOB: May 05, 1933 Today's Date: 12/19/2016   History of Present Illness  83 y.o. male with a known history of Asthma, BPH, CAD, stent in June 2018, CML. CKD stage 3, Htn, HLD, Hypothyroidism, DM- for last 1-2 months have difficult in swallowing food, which is progressed now, with liquids too for last 2-3 weeks. Also have c/o generalized weakness, fatigue and SOB with minimal exertion.  Clinical Impression  Pt showed good safety and confidence with prolonged ambulation with and w/o AD, he was also able to negotiate up/down steps w/o issue.  He was able to do all mobility, etc w/o issue and does not require further PT intervention.    Follow Up Recommendations No PT follow up    Equipment Recommendations  None recommended by PT    Recommendations for Other Services       Precautions / Restrictions Restrictions Weight Bearing Restrictions: No      Mobility  Bed Mobility Overal bed mobility: Independent             General bed mobility comments: Pt easily able to get to sitting EOB  Transfers Overall transfer level: Independent Equipment used: None             General transfer comment: Able to rise safely w/o AD  Ambulation/Gait Ambulation/Gait assistance: Modified independent (Device/Increase time) Ambulation Distance (Feet): 250 Feet Assistive device: Rolling walker (2 wheeled);1 person hand held assist;None       General Gait Details: Pt able to safely and confidently circumambulate the nurses station w/o issue.  He was used a walker much of the time but was able to ambulate with single UE use of rail as well as no AD for prolonged portions of the effort.  Stairs Stairs: Yes Stairs assistance: Modified independent (Device/Increase time) Stair Management: One rail Left Number of Stairs: 4 General stair comments: Pt was confident using reciprocal strategy with minimal UE  reliance  Wheelchair Mobility    Modified Rankin (Stroke Patients Only)       Balance Overall balance assessment: Independent                                           Pertinent Vitals/Pain Pain Assessment: No/denies pain    Home Living Family/patient expects to be discharged to:: Private residence Living Arrangements: Spouse/significant other Available Help at Discharge: Family Type of Home: House Home Access: Stairs to enter Entrance Stairs-Rails: Can reach both Entrance Stairs-Number of Steps: 3 Home Layout: One level Home Equipment: Walker - 2 wheels;Cane - single point      Prior Function Level of Independence: Independent with assistive device(s)         Comments: Pt still works, 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, newspaper        Extremity/Trunk Assessment   Upper Extremity Assessment Upper Extremity Assessment: Overall WFL for tasks assessed    Lower Extremity Assessment Lower Extremity Assessment: Overall WFL for tasks assessed       Communication   Communication: No difficulties  Cognition Arousal/Alertness: Awake/alert Behavior During Therapy: WFL for tasks assessed/performed Overall Cognitive Status: Within Functional Limits for tasks assessed  General Comments      Exercises     Assessment/Plan    PT Assessment Patent does not need any further PT services  PT Problem List         PT Treatment Interventions      PT Goals (Current goals can be found in the Care Plan section)  Acute Rehab PT Goals Patient Stated Goal: go home PT Goal Formulation: All assessment and education complete, DC therapy    Frequency     Barriers to discharge        Co-evaluation               AM-PAC PT "6 Clicks" Daily Activity  Outcome Measure Difficulty turning over in bed (including adjusting bedclothes, sheets and blankets)?: None Difficulty moving from  lying on back to sitting on the side of the bed? : None Difficulty sitting down on and standing up from a chair with arms (e.g., wheelchair, bedside commode, etc,.)?: None Help needed moving to and from a bed to chair (including a wheelchair)?: None Help needed walking in hospital room?: None Help needed climbing 3-5 steps with a railing? : None 6 Click Score: 24    End of Session Equipment Utilized During Treatment: Gait belt Activity Tolerance: Patient tolerated treatment well Patient left: in chair;with call bell/phone within reach Nurse Communication: Mobility status PT Visit Diagnosis: Muscle weakness (generalized) (M62.81);Difficulty in walking, not elsewhere classified (R26.2)    Time: 7591-6384 PT Time Calculation (min) (ACUTE ONLY): 26 min   Charges:   PT Evaluation $PT Eval Low Complexity: 1 Low     PT G Codes:   PT G-Codes **NOT FOR INPATIENT CLASS** Functional Assessment Tool Used: AM-PAC 6 Clicks Basic Mobility Functional Limitation: Mobility: Walking and moving around Mobility: Walking and Moving Around Current Status (Y6599): 0 percent impaired, limited or restricted Mobility: Walking and Moving Around Goal Status (J5701): 0 percent impaired, limited or restricted Mobility: Walking and Moving Around Discharge Status (X7939): 0 percent impaired, limited or restricted    Kreg Shropshire, DPT 12/19/2016, 1:06 PM

## 2016-12-19 NOTE — Progress Notes (Deleted)
   12/19/16 0700  Clinical Encounter Type  Visited With Patient  Visit Type Initial;Spiritual support  Referral From Nurse  Spiritual Encounters  Spiritual Needs Prayer;Emotional  [facility] paged to ED for unresponsive patient; upon arrival, patient was awake and no family present; [place] stayed until family arrived but no further support was requested.

## 2016-12-19 NOTE — Consult Note (Signed)
Lucas Darby, MD [address]  [place]  [place], [place] [address]  Main: [phone]  Fax: [phone] Pager: [phone]   Consultation  Referring Provider:     No ref. provider found Primary Care Physician:  Juanell Fairly, MD Primary Gastroenterologist: None         Reason for Consultation:     Dysphagia  Date of Admission:  12/18/2016 Date of Consultation:  12/19/2016         HPI:   Lucas Caldwell is a 83 y.o. male with multiple comorbidities as listed below, significant for coronary artery disease, congestive heart failure, atrial fibrillation on anticoagulation with brilinta and Coumadin, CML, admitted with AKI and 3 months history of intermittent dysphagia to solid food. He reports that eating chicken or beef or hard foods which are difficult to chew result in food stuck in upper chest. He denies trouble swallowing liquids or any other foods. Denies losing any weight. Denies abdominal pain, nausea, vomiting, heartburn or regurgitation. He denies having an upper endoscopy or a colonoscopy in the past. He had a barium esophagram today which did not reveal any obvious stricture or a space-occupying lesion or extrinsic compression. There was a possibility of esophageal spasm due to evidence of tertiary contractions of the distal third of the esophagus. [name] also evaluated by speech pathology and there was no evidence of oropharyngeal dysphagia. [name] on Pepcid as outpatient. He denies having any food impaction.  GI Procedures: None  Past Medical History:  Diagnosis Date  . Arthritis    "probably in his legs" (08/15/2016)  . Asthma   . Blind right eye   . BPH (benign prostatic hyperplasia)   . Breast asymmetry    Left breast is larger, present for several years.  [name] CAD (coronary artery disease)    a. Cath in the late 90's - reportedly ok;  b. 2014 s/p stenting x 2 @ [facility]; c 08/19/16 Cath/PCI with DES -> RCA, plan to treat LM 70% medically. Seen by surgery and felt to  be too high risk for CABG.  . Chronic combined systolic (congestive) and diastolic (congestive) heart failure ([facility])    a. Previously reduced EF-->50% by echo in 2012;  b. 06/2015 Echo: EF 50-55%  c. 07/2016 Echo: EF 45-50%.  . CKD (chronic kidney disease), stage III ([facility])   . CML (chronic myelocytic leukemia) ([facility])   . GERD (gastroesophageal reflux disease)   . Gout   . Hyperlipemia   . Hypertension   . Hypothyroidism   . Iron deficiency anemia   . Migraine    "in the 1960s" (08/15/2016)  . NICM (nonischemic cardiomyopathy) ([facility])    a. Previously reduced EF-->50% by echo in 2012;  b. 06/2015 Echo: EF 50-55%, Gr2 DD, mild MR, mildly dil LA, Ao sclerosis, mild TR;  c. 07/2016 Echo: EF 45-50%, ? inf HK, mild MR, mildly dil LA.  [name] PAF (paroxysmal atrial fibrillation) ([facility])    a. ? Dx 2014-->s/p DCCV;  b. CHA2DS2VASc = 6-->chronic coumadin.  [name] RBBB   . Type II diabetes mellitus ([facility])     Past Surgical History:  Procedure Laterality Date  . CATARACT EXTRACTION W/ INTRAOCULAR LENS  IMPLANT, BILATERAL Bilateral   . CORONARY ANGIOPLASTY WITH STENT PLACEMENT  09/2012   2 stents  . CORONARY STENT INTERVENTION N/A 08/19/2016   Procedure: Coronary Stent Intervention;  Surgeon: [name], MD;  Location: [facility];  Service: Cardiovascular;  Laterality: N/A;  .  HERNIA REPAIR     "navel"  . LEFT HEART CATH AND CORONARY ANGIOGRAPHY N/A 08/14/2016   Procedure: Left Heart Cath and Coronary Angiography;  Surgeon: [name], MD;  Location: [facility];  Service: Cardiovascular;  Laterality: N/A;  . TOE AMPUTATION Right    "big toe"    Prior to Admission medications   Medication Sig Start Date End Date Taking? Authorizing Provider  acetaminophen (TYLENOL) 500 MG tablet Take 500 mg by mouth every 6 (six) hours as needed.   Yes [provider]  albuterol (PROVENTIL HFA;VENTOLIN HFA) 108 (90 BASE) MCG/ACT inhaler Inhale 2 puffs into the lungs every 6 (six) hours as  needed for wheezing or shortness of breath. 11/04/14  Yes Ahmed Prima, MD  atorvastatin (LIPITOR) 80 MG tablet Take 1 tablet (80 mg total) by mouth daily at 6 PM. 08/20/16  Yes Cheryln Manly, NP  bosutinib (BOSULIF) 100 MG tablet Take 200 mg by mouth at bedtime. Take with food.    Yes [provider]  digoxin (LANOXIN) 0.125 MG tablet Take 125 mcg by mouth daily.  11/10/15  Yes [provider]  enalapril (VASOTEC) 5 MG tablet Take 5 mg by mouth daily.   Yes [provider]  famotidine (PEPCID) 20 MG tablet Take 20 mg by mouth at bedtime as needed for heartburn. Take 2 hours after chemo pill.   Yes [provider]  fenofibrate 160 MG tablet Take 1 tablet (160 mg total) by mouth daily. 08/21/16  Yes Reino Bellis B, NP  furosemide (LASIX) 20 MG tablet Take 20 mg by mouth daily.   Yes [provider]  hydrocortisone 2.5 % ointment Apply topically 3 (three) times daily. 07/20/16  Yes Triplett, Cari B, FNP  isosorbide mononitrate (IMDUR) 30 MG 24 hr tablet Take 1 tablet (30 mg total) by mouth daily. 09/21/16  Yes Sainani, Belia Heman, MD  LANTUS SOLOSTAR 100 UNIT/ML Solostar Pen Inject 20 Units into the skin at bedtime. Patient taking differently: Inject 65 Units into the skin at bedtime.  12/11/15  Yes Demetrios Loll, MD  levothyroxine (SYNTHROID, LEVOTHROID) 25 MCG tablet Take 25 mcg by mouth daily before breakfast.  10/27/15  Yes [provider]  loperamide (IMODIUM A-D) 2 MG tablet Take 2 mg by mouth 3 (three) times daily as needed for diarrhea or loose stools.    Yes [provider]  magnesium oxide (MAG-OX) 400 MG tablet Take 400 mg by mouth 2 (two) times daily.   Yes [provider]  metoprolol succinate (TOPROL-XL) 50 MG 24 hr tablet Take 50 mg by mouth every morning. 12/04/15  Yes [provider]  Multiple Vitamins-Minerals (MULTIVITAMIN ADULTS 50+ PO) Take 1 tablet by mouth every morning.   Yes [provider]    nepafenac (NEVANAC) 0.1 % ophthalmic suspension Place 1 drop into both eyes 3 (three) times daily as needed.   Yes [provider]  nitroGLYCERIN (NITROSTAT) 0.4 MG SL tablet Place 1 tablet (0.4 mg total) under the tongue every 5 (five) minutes x 3 doses as needed for chest pain. 10/02/16  Yes End, [name], MD  pregabalin (LYRICA) 50 MG capsule Take 50 mg by mouth 3 (three) times daily.   Yes [provider]  ranitidine (ZANTAC) 300 MG tablet Take 300 mg by mouth daily.   Yes [provider]  Spacer/Aero-Holding Chambers [facility] ADVANTAGE) MISC 1 each by Other route once. 11/04/14  Yes Ahmed Prima, MD  ticagrelor (BRILINTA) 90 MG TABS  tablet Take 1 tablet (90 mg total) by mouth 2 (two) times daily. 10/02/16  Yes End, [name], MD  warfarin (COUMADIN) 2 MG tablet Take 3 mg by mouth at bedtime.    Yes [provider]  allopurinol (ZYLOPRIM) 300 MG tablet Take 300 mg by mouth daily.  11/10/15   [provider]    Family History  Problem Relation Age of Onset  . Brain cancer Father   . Diabetes Sister   . Heart attack Sister      Social History  Substance Use Topics  . Smoking status: Former Smoker    Packs/day: 2.00    Years: 15.00    Types: Cigarettes  . Smokeless tobacco: Never Used     Comment: quit 1979  . Alcohol use No     Comment: previously drank heavily - quit 1979.    Allergies as of 12/18/2016 - Review Complete 12/18/2016  Allergen Reaction Noted  . Clopidogrel Anaphylaxis 07/06/2012  . Ciprofloxacin Itching 10/14/2012  . Spironolactone  03/29/2014  . Plavix [clopidogrel bisulfate] Anxiety 11/04/2014    Review of Systems:    All systems reviewed and negative except where noted in HPI.   Physical Exam:  Vital signs in last 24 hours: Temp:  [98.1 F (36.7 C)-98.3 F (36.8 C)] 98.3 F (36.8 C) (10/04 1337) Pulse Rate:  [51-57] 56 (10/04 1337) Resp:  [16-20] 18 (10/04 1337) BP: (126-157)/(55-60) 138/57  (10/04 1337) SpO2:  [99 %-100 %] 99 % (10/04 1337) Weight:  [196 lb 1.6 oz (89 kg)] 196 lb 1.6 oz (89 kg) (10/03 2117) Last BM Date: 12/18/16 General:   Pleasant, cooperative in NAD Head:  Normocephalic and atraumatic. Eyes:   No icterus.   Conjunctiva pink. PERRLA. Ears:  Normal auditory acuity. Neck:  Supple; no masses or thyroidomegaly Lungs: Respirations even and unlabored. Lungs clear to auscultation bilaterally.   No wheezes, crackles, or rhonchi.  Heart:  Regular rate and rhythm;  Without murmur, clicks, rubs or gallops Abdomen:  Soft, nondistended, nontender. Normal bowel sounds. No appreciable masses or hepatomegaly.  No rebound or guarding.  Rectal:  Not performed. Msk:  Symmetrical without gross deformities.  Strength normal Extremities:  Without edema, cyanosis or clubbing. Neurologic:  Alert and oriented x3;  grossly normal neurologically. Skin:  Intact without significant lesions or rashes. Cervical Nodes:  No significant cervical adenopathy. Psych:  Alert and cooperative. Normal affect.  LAB RESULTS: CBC Latest Ref Rng & Units 12/19/2016 12/18/2016 09/20/2016  WBC 3.8 - 10.6 K/uL 8.2 8.3 8.9  Hemoglobin 13.0 - 18.0 g/dL 8.7(L) 8.3(L) 9.5(L)  Hematocrit 40.0 - 52.0 % 25.5(L) 25.1(L) 28.2(L)  Platelets 150 - 440 K/uL 236 227 230    BMET BMP Latest Ref Rng & Units 12/19/2016 12/18/2016 09/20/2016  Glucose 65 - 99 mg/dL 239(H) 359(H) 207(H)  BUN 6 - 20 mg/dL 31(H) 35(H) 29(H)  Creatinine 0.61 - 1.24 mg/dL 2.06(H) 2.40(H) 1.61(H)  Sodium 135 - 145 mmol/L 137 133(L) 134(L)  Potassium 3.5 - 5.1 mmol/L 4.2 4.5 4.5  Chloride 101 - 111 mmol/L 105 101 106  CO2 22 - 32 mmol/L '24 25 22  ' Calcium 8.9 - 10.3 mg/dL 9.0 8.9 9.1    LFT Hepatic Function Latest Ref Rng & Units 10/16/2016 08/18/2016 05/17/2012  Total Protein 6.0 - 8.5 g/dL 7.2 7.6 8.5(H)  Albumin 3.5 - 4.7 g/dL 3.8 3.2(L) 4.0  AST 0 - 40 IU/L 34 25 24  ALT 0 - 44 IU/L 21 16(L) 28  Alk Phosphatase 39 -  117 IU/L 91 87 99    Total Bilirubin 0.0 - 1.2 mg/dL <0.2 0.6 0.5  Bilirubin, Direct 0.00 - 0.40 mg/dL 0.08 0.1 -     STUDIES: Dg Chest 2 View  Result Date: 12/18/2016 CLINICAL DATA:  Increasing shortness of breath over the last 4 months. History of coronary artery disease with stent placement, chronic kidney disease and hypertension. EXAM: CHEST  2 VIEW COMPARISON:  09/19/2016 and 08/16/2016. FINDINGS: The heart size and mediastinal contours are stable. There is atherosclerosis of the aorta. A coronary stent is noted on the lateral view. Stable borderline interstitial coarsening on the frontal examination without corresponding finding on the lateral view. There is no edema, confluent airspace opacity or pleural effusion. The bones appear unchanged. IMPRESSION: Stable radiographic appearance of the chest. No acute cardiopulmonary process. Electronically Signed   By: Richardean Sale M.D.   On: 12/18/2016 14:39   Dg Esophagus  Result Date: 12/19/2016 CLINICAL DATA:  Dysphagia EXAM: ESOPHOGRAM / BARIUM SWALLOW / BARIUM TABLET STUDY TECHNIQUE: Combined double contrast and single contrast examination performed using effervescent crystals, thick barium liquid, and thin barium liquid. The patient was observed with fluoroscopy swallowing a 13 mm barium sulphate tablet. FLUOROSCOPY TIME:  Fluoroscopy Time:  0.9 minutes Radiation Exposure Index (if provided by the fluoroscopic device): 11.5 mGy Number of Acquired Spot Images: 0 COMPARISON:  None. FINDINGS: There was normal pharyngeal anatomy and motility. Contrast flowed freely through the esophagus without evidence of stricture or mass. There was normal esophageal mucosa without evidence of irregularity or ulceration. Tertiary contractions of the distal third of the esophagus as can be seen with esophageal spasm. No evidence of reflux. No definite hiatal hernia was demonstrated. At the end of the examination a 13 mm barium tablet was administered which transited through the  esophagus and esophagogastric junction without delay. IMPRESSION: 1. Tertiary contractions of the distal third of the esophagus as can be seen with esophageal spasm. 2. No esophageal stricture. Electronically Signed   By: Kathreen Devoid   On: 12/19/2016 09:00   US Renal  Result Date: 12/19/2016 CLINICAL DATA:  Acute renal failure EXAM: RENAL / URINARY TRACT ULTRASOUND COMPLETE COMPARISON:  06/20/2016 CT abdomen/pelvis. FINDINGS: Right Kidney: Length: 10.3 cm. Echogenicity within normal limits. No mass or hydronephrosis visualized. Incidental diffuse hepatic steatosis in the visualized right liver lobe. Left Kidney: Length: 10.8 cm. Echogenicity within normal limits. No mass or hydronephrosis visualized. Bladder: Appears normal for degree of bladder distention. Bilateral ureteral jets are demonstrated in the bladder. IMPRESSION: 1. Normal ultrasound of the kidneys and bladder, with no hydronephrosis. 2. Incidental diffuse hepatic steatosis. Electronically Signed   By: Ilona Sorrel M.D.   On: 12/19/2016 10:14      Impression / Plan:   AMAD MAU is a 83 y.o. male with Multiple comorbidities, on Coumadin and brilinta, three-month history of intermittent dysphagia only to hard foods with no other upper GI symptoms. Most likely secondary to poor dentition or he could have mild peptic stricture. Given his age, cannot rule out malignancy. Recommend, diagnostic upper endoscopy with minimal sedation or no sedation. Given that he is on 2 different blood thinners, if I find an esophageal stricture or a ring, I will not be able to dilate due to high risk for bleeding on anticoagulation.  I have discussed alternative options, risks & benefits,  which include, but are not limited to, bleeding, infection, perforation,respiratory complication & drug reaction.  The patient agrees with this plan & written consent will  be obtained.   Thank you for involving me in the care of this patient.      LOS: 1 day    Sherri Sear, MD  12/19/2016, 7:47 PM   Note: This dictation was prepared with Dragon dictation along with smaller phrase technology. Any transcriptional errors that result from this process are unintentional.

## 2016-12-19 NOTE — Progress Notes (Signed)
ANTICOAGULATION CONSULT NOTE - Initial Consult  Pharmacy Consult for warfarin Indication: atrial fibrillation  Allergies  Allergen Reactions  . Clopidogrel Anaphylaxis    Other reaction(s): ANAPHYLAXIS;altered mental status;  (electrolytes "out of wack" and confusion per family; THEY DO NOT REMEMBER ANY OTHER REACTION)- MSB 10/10/15  . Ciprofloxacin Itching  . Spironolactone     Other reaction(s): Other (See Comments) gynecomastia  . Plavix [Clopidogrel Bisulfate] Anxiety    Patient Measurements: Height: 5\' 6"  (167.6 cm) Weight: 196 lb 1.6 oz (89 kg) IBW/kg (Calculated) : 63.8  Vital Signs: Temp: 98.1 F (36.7 C) (10/04 0749) Temp Source: Oral (10/04 0749) BP: 150/60 (10/04 0749) Pulse Rate: 57 (10/04 0749)  Labs:  Recent Labs  12/18/16 1456 12/18/16 1835 12/19/16 0009  HGB 8.3*  --  8.7*  HCT 25.1*  --  25.5*  PLT 227  --  236  LABPROT 26.0*  --   --   INR 2.40  --   --   CREATININE 2.40*  --  2.06*  TROPONINI <0.03 <0.03  --     Estimated Creatinine Clearance: 29.4 mL/min (A) (by C-G formula based on SCr of 2.06 mg/dL (H)).  Assessment: Pharmacy consulted to monitor and dose warfarin in this 83 year old male who was taking warfarin PTA for atrial fibrillation.   Confirmed home dose of warfarin 3 mg PO Daily with patient and family. INR = 2.4 is therapeutic on admission.  Goal of Therapy:  INR 2-3 Monitor platelets by anticoagulation protocol: Yes   Plan:  Continue home dose of warfarin 3 mg PO daily and recheck INR with AM labs tomorrow.  Lenis Noon, PharmD, BCPS Clinical Pharmacist 12/19/2016,9:13 AM

## 2016-12-19 NOTE — Plan of Care (Signed)
Problem: Education: Goal: Knowledge of [facility] Education information/materials will improve Outcome: Progressing VSS, free of falls during shift.  Denies pain.  No complaints overnight.  Bed in low position, call bell within reach.  WCTM.   

## 2016-12-19 NOTE — Consult Note (Signed)
Date: 12/19/2016                  Patient Name:  Lucas Caldwell  MRN: [id number]  DOB: April 17, 1933  Age / Sex: 83 y.o., male         PCP: Juanell Fairly, MD                 Service Requesting Consult: IM/ Max Sane, MD                 Reason for Consult: ARF            History of Present Illness: Patient is a 83 y.o. male with medical problems of BPH, CAD, CML,GERD, Gout, HTN, Hypothyroidism, who was admitted to [facility] on 12/18/2016 .  Patient states that he has been feeling weak for the past several days at home. He states it got to the point where he woke up with confusion at night. He couldn't figure out what to do for himself therefore he decided to come to the hospital for evaluation. Patient has baseline CK D with creatinine of 1.6/GFR 45 from July 2018 His admission creatinine was 2.40, which has improved to 2.06 today Blood sugar is uncontrolled. Yesterday was 359, today's blood sugar is 239  He had a renal ultrasound as described below which was unremarkable except for hepatic steatosis .  Medications: Outpatient medications: Prescriptions Prior to Admission  Medication Sig Dispense Refill Last Dose  . acetaminophen (TYLENOL) 500 MG tablet Take 500 mg by mouth every 6 (six) hours as needed.   PRN at PRN  . albuterol (PROVENTIL HFA;VENTOLIN HFA) 108 (90 BASE) MCG/ACT inhaler Inhale 2 puffs into the lungs every 6 (six) hours as needed for wheezing or shortness of breath. 1 Inhaler 2 PRN at PRN  . atorvastatin (LIPITOR) 80 MG tablet Take 1 tablet (80 mg total) by mouth daily at 6 PM. 90 tablet 1 12/17/2016 at 1800  . bosutinib (BOSULIF) 100 MG tablet Take 200 mg by mouth at bedtime. Take with food.    12/17/2016 at 2000  . digoxin (LANOXIN) 0.125 MG tablet Take 125 mcg by mouth daily.   12 12/18/2016 at 0800  . enalapril (VASOTEC) 5 MG tablet Take 5 mg by mouth daily.   12/18/2016 at 0800  . famotidine (PEPCID) 20 MG tablet Take 20 mg by mouth at bedtime as needed for heartburn.  Take 2 hours after chemo pill.   12/17/2016 at 2200  . fenofibrate 160 MG tablet Take 1 tablet (160 mg total) by mouth daily. 30 tablet 3 12/18/2016 at 0800  . furosemide (LASIX) 20 MG tablet Take 20 mg by mouth daily.   12/18/2016 at 0800  . hydrocortisone 2.5 % ointment Apply topically 3 (three) times daily. 30 g 0 PRN at PRN  . isosorbide mononitrate (IMDUR) 30 MG 24 hr tablet Take 1 tablet (30 mg total) by mouth daily. 30 tablet 1 12/18/2016 at 0800  . LANTUS SOLOSTAR 100 UNIT/ML Solostar Pen Inject 20 Units into the skin at bedtime. (Patient taking differently: Inject 65 Units into the skin at bedtime. ) 15 mL 0 12/17/2016 at 2000  . levothyroxine (SYNTHROID, LEVOTHROID) 25 MCG tablet Take 25 mcg by mouth daily before breakfast.   12 12/18/2016 at 0800  . loperamide (IMODIUM A-D) 2 MG tablet Take 2 mg by mouth 3 (three) times daily as needed for diarrhea or loose stools.    PRN at PRN  . magnesium oxide (MAG-OX) 400 MG tablet  Take 400 mg by mouth 2 (two) times daily.   12/18/2016 at 0800  . metoprolol succinate (TOPROL-XL) 50 MG 24 hr tablet Take 50 mg by mouth every morning.  12 12/18/2016 at 0800  . Multiple Vitamins-Minerals (MULTIVITAMIN ADULTS 50+ PO) Take 1 tablet by mouth every morning.   12/18/2016 at Unknown time  . nepafenac (NEVANAC) 0.1 % ophthalmic suspension Place 1 drop into both eyes 3 (three) times daily as needed.   PRN at PRN  . nitroGLYCERIN (NITROSTAT) 0.4 MG SL tablet Place 1 tablet (0.4 mg total) under the tongue every 5 (five) minutes x 3 doses as needed for chest pain. 75 tablet 2 PRN at PRN  . pregabalin (LYRICA) 50 MG capsule Take 50 mg by mouth 3 (three) times daily.   12/18/2016 at 1200  . ranitidine (ZANTAC) 300 MG tablet Take 300 mg by mouth daily.   12/18/2016 at 0800  . Spacer/Aero-Holding Chambers (OPTICHAMBER ADVANTAGE) MISC 1 each by Other route once. 1 each 0 Taking  . ticagrelor (BRILINTA) 90 MG TABS tablet Take 1 tablet (90 mg total) by mouth 2 (two) times daily. 180  tablet 3 12/18/2016 at 0800  . warfarin (COUMADIN) 2 MG tablet Take 3 mg by mouth at bedtime.    12/17/2016 at 1800  . allopurinol (ZYLOPRIM) 300 MG tablet Take 300 mg by mouth daily.   12 Not Taking at Unknown time    Current medications: Current Facility-Administered Medications  Medication Dose Route Frequency Provider Last Rate Last Dose  . 0.9 %  sodium chloride infusion   Intravenous Continuous Vaughan Basta, MD 50 mL/hr at 12/19/16 0058    . acetaminophen (TYLENOL) tablet 500 mg  500 mg Oral Q6H PRN Vaughan Basta, MD      . albuterol (PROVENTIL) (2.5 MG/3ML) 0.083% nebulizer solution 3 mL  3 mL Inhalation Q6H PRN Vaughan Basta, MD      . atorvastatin (LIPITOR) tablet 80 mg  80 mg Oral q1800 Vaughan Basta, MD      . bosutinib (BOSULIF) tablet 200 mg  200 mg Oral QHS Vaughan Basta, MD      . digoxin (LANOXIN) tablet 125 mcg  125 mcg Oral Daily Vaughan Basta, MD   125 mcg at 12/19/16 0056  . docusate sodium (COLACE) capsule 100 mg  100 mg Oral BID PRN Vaughan Basta, MD      . famotidine (PEPCID) tablet 20 mg  20 mg Oral QHS PRN Vaughan Basta, MD      . fenofibrate tablet 160 mg  160 mg Oral Daily Vaughan Basta, MD   160 mg at 12/19/16 0056  . Influenza vac split quadrivalent PF (FLUZONE HIGH-DOSE) injection 0.5 mL  0.5 mL Intramuscular Tomorrow-1000 Vaughan Basta, MD      . insulin aspart (novoLOG) injection 0-9 Units  0-9 Units Subcutaneous TID WC Vaughan Basta, MD   3 Units at 12/19/16 0801  . insulin glargine (LANTUS) injection 20 Units  20 Units Subcutaneous QHS Vaughan Basta, MD   20 Units at 12/19/16 0056  . levothyroxine (SYNTHROID, LEVOTHROID) tablet 25 mcg  25 mcg Oral QAC breakfast Vaughan Basta, MD   25 mcg at 12/19/16 0802  . loperamide (IMODIUM) capsule 2 mg  2 mg Oral TID PRN Vaughan Basta, MD      . magnesium oxide (MAG-OX) tablet 400 mg  400 mg Oral BID  Vaughan Basta, MD   400 mg at 12/19/16 0056  . multivitamin with minerals tablet   Oral Daily Vaughan Basta, MD      .  pregabalin (LYRICA) capsule 50 mg  50 mg Oral TID Vaughan Basta, MD   50 mg at 12/19/16 0056  . ticagrelor (BRILINTA) tablet 90 mg  90 mg Oral BID Vaughan Basta, MD   90 mg at 12/19/16 0108  . warfarin (COUMADIN) tablet 3 mg  3 mg Oral q1800 Lenis Noon, [facility]      . Warfarin - Pharmacist Dosing Inpatient   Does not apply q1800 Lenis Noon, [facility]          Allergies: Allergies  Allergen Reactions  . Clopidogrel Anaphylaxis    Other reaction(s): ANAPHYLAXIS;altered mental status;  (electrolytes "out of wack" and confusion per family; THEY DO NOT REMEMBER ANY OTHER REACTION)- MSB 10/10/15  . Ciprofloxacin Itching  . Spironolactone     Other reaction(s): Other (See Comments) gynecomastia  . Plavix [Clopidogrel Bisulfate] Anxiety      Past Medical History: Past Medical History:  Diagnosis Date  . Arthritis    "probably in his legs" (08/15/2016)  . Asthma   . Blind right eye   . BPH (benign prostatic hyperplasia)   . Breast asymmetry    Left breast is larger, present for several years.  [name] CAD (coronary artery disease)    a. Cath in the late 90's - reportedly ok;  b. 2014 s/p stenting x 2 @ [facility]; c 08/19/16 Cath/PCI with DES -> RCA, plan to treat LM 70% medically. Seen by surgery and felt to be too high risk for CABG.  . Chronic combined systolic (congestive) and diastolic (congestive) heart failure ([place])    a. Previously reduced EF-->50% by echo in 2012;  b. 06/2015 Echo: EF 50-55%  c. 07/2016 Echo: EF 45-50%.  . CKD (chronic kidney disease), stage III ([place])   . CML (chronic myelocytic leukemia) ([place])   . GERD (gastroesophageal reflux disease)   . Gout   . Hyperlipemia   . Hypertension   . Hypothyroidism   . Iron deficiency anemia   . Migraine    "in the 1960s" (08/15/2016)  . NICM (nonischemic cardiomyopathy) ([place])    a.  Previously reduced EF-->50% by echo in 2012;  b. 06/2015 Echo: EF 50-55%, Gr2 DD, mild MR, mildly dil LA, Ao sclerosis, mild TR;  c. 07/2016 Echo: EF 45-50%, ? inf HK, mild MR, mildly dil LA.  [name] PAF (paroxysmal atrial fibrillation) ([facility])    a. ? Dx 2014-->s/p DCCV;  b. CHA2DS2VASc = 6-->chronic coumadin.  [name] RBBB   . Type II diabetes mellitus ([place])      Past Surgical History: Past Surgical History:  Procedure Laterality Date  . CATARACT EXTRACTION W/ INTRAOCULAR LENS  IMPLANT, BILATERAL Bilateral   . CORONARY ANGIOPLASTY WITH STENT PLACEMENT  09/2012   2 stents  . CORONARY STENT INTERVENTION N/A 08/19/2016   Procedure: Coronary Stent Intervention;  Surgeon: [name], MD;  Location: [place] [facility];  Service: Cardiovascular;  Laterality: N/A;  . HERNIA REPAIR     "navel"  . LEFT HEART CATH AND CORONARY ANGIOGRAPHY N/A 08/14/2016   Procedure: Left Heart Cath and Coronary Angiography;  Surgeon: [name], MD;  Location: [place] [facility];  Service: Cardiovascular;  Laterality: N/A;  . TOE AMPUTATION Right    "big toe"     Family History: Family History  Problem Relation Age of Onset  . Brain cancer Father   . Diabetes Sister   . Heart attack Sister      Social History: Social History   Social History  . Marital status:  Married    Spouse name: N/A  . Number of children: N/A  . Years of education: N/A   Occupational History  . 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   Social History Main Topics  . Smoking status: Former Smoker    Packs/day: 2.00    Years: 15.00    Types: Cigarettes  . Smokeless tobacco: Never Used     Comment: quit 1979  . Alcohol use No     Comment: previously drank heavily - quit 1979.  . Drug use: No  . Sexual activity: No   Other Topics Concern  . Not on file   Social History Narrative  . No narrative on file     Review of Systems: Gen: Denies any fevers, chills, reports generalized weakness  HEENT: Denies vision  or hearing problems  CV: Reports chest discomfort which is improved at present  Resp: Occasional cough, no sputum production  DX:IPJASNKN has been good, no nausea or vomiting, no blood from stool  GU : Denies any kidney problems, no blood in the urine  MS: No problems reported  Derm:  No complaints  Psych:No complaints  Heme: No complaints  Neuro: No complaints  Endocrine. No complaints  Vital Signs: Blood pressure (!) 150/60, pulse (!) 57, temperature 98.1 F (36.7 C), temperature source Oral, resp. rate 20, height 5\' 6"  (1.676 m), weight 89 kg (196 lb 1.6 oz), SpO2 100 %.   Intake/Output Summary (Last 24 hours) at 12/19/16 0935 Last data filed at 12/19/16 0446  Gross per 24 hour  Intake              679 ml  Output              780 ml  Net             -101 ml    Weight trends: Filed Weights   12/18/16 1351 12/18/16 2117  Weight: 88.9 kg (196 lb) 89 kg (196 lb 1.6 oz)    Physical Exam: General:  No acute distress, sitting up in the chair  HEENT Moist oral mucous membranes  Neck:  No thyromegaly, no distended neck veins  Lungs: Normal breathing effort, clear to auscultation  Heart::  No rub or gallop  Abdomen: Soft, nontender  Extremities:  Trace edema  Neurologic: Alert, oriented  Skin: Dry skin             Lab results: Basic Metabolic Panel:  Recent Labs Lab 12/18/16 1456 12/19/16 0009  NA 133* 137  K 4.5 4.2  CL 101 105  CO2 25 24  GLUCOSE 359* 239*  BUN 35* 31*  CREATININE 2.40* 2.06*  CALCIUM 8.9 9.0    Liver Function Tests: No results for input(s): AST, ALT, ALKPHOS, BILITOT, PROT, ALBUMIN in the last 168 hours. No results for input(s): LIPASE, AMYLASE in the last 168 hours. No results for input(s): AMMONIA in the last 168 hours.  CBC:  Recent Labs Lab 12/18/16 1456 12/19/16 0009  WBC 8.3 8.2  HGB 8.3* 8.7*  HCT 25.1* 25.5*  MCV 77.3* 76.4*  PLT 227 236    Cardiac Enzymes:  Recent Labs Lab 12/18/16 1835  TROPONINI <0.03     BNP: Invalid input(s): POCBNP  CBG:  Recent Labs Lab 12/18/16 2200 12/19/16 0054 12/19/16 0738  GLUCAP 229* 243* 222*    Microbiology: No results found for this or any previous visit (from the past 720 hour(s)).   Coagulation Studies:  Recent Labs  12/18/16 1456  LABPROT 26.0*  INR 2.40    Urinalysis:  Recent Labs  12/18/16 [place] 1.012  PHURINE 6.0  GLUCOSEU >=500*  HGBUR NEGATIVE  BILIRUBINUR NEGATIVE  KETONESUR NEGATIVE  PROTEINUR NEGATIVE  NITRITE NEGATIVE  LEUKOCYTESUR NEGATIVE        Imaging: Dg Chest 2 View  Result Date: 12/18/2016 CLINICAL DATA:  Increasing shortness of breath over the last 4 months. History of coronary artery disease with stent placement, chronic kidney disease and hypertension. EXAM: CHEST  2 VIEW COMPARISON:  09/19/2016 and 08/16/2016. FINDINGS: The heart size and mediastinal contours are stable. There is atherosclerosis of the aorta. A coronary stent is noted on the lateral view. Stable borderline interstitial coarsening on the frontal examination without corresponding finding on the lateral view. There is no edema, confluent airspace opacity or pleural effusion. The bones appear unchanged. IMPRESSION: Stable radiographic appearance of the chest. No acute cardiopulmonary process. Electronically Signed   By: Richardean Sale M.D.   On: 12/18/2016 14:39   Dg Esophagus  Result Date: 12/19/2016 CLINICAL DATA:  Dysphagia EXAM: ESOPHOGRAM / BARIUM SWALLOW / BARIUM TABLET STUDY TECHNIQUE: Combined double contrast and single contrast examination performed using effervescent crystals, thick barium liquid, and thin barium liquid. The patient was observed with fluoroscopy swallowing a 13 mm barium sulphate tablet. FLUOROSCOPY TIME:  Fluoroscopy Time:  0.9 minutes Radiation Exposure Index (if provided by the fluoroscopic device): 11.5 mGy Number of Acquired Spot Images: 0 COMPARISON:  None. FINDINGS: There was normal  pharyngeal anatomy and motility. Contrast flowed freely through the esophagus without evidence of stricture or mass. There was normal esophageal mucosa without evidence of irregularity or ulceration. Tertiary contractions of the distal third of the esophagus as can be seen with esophageal spasm. No evidence of reflux. No definite hiatal hernia was demonstrated. At the end of the examination a 13 mm barium tablet was administered which transited through the esophagus and esophagogastric junction without delay. IMPRESSION: 1. Tertiary contractions of the distal third of the esophagus as can be seen with esophageal spasm. 2. No esophageal stricture. Electronically Signed   By: Kathreen Devoid   On: 12/19/2016 09:00      Assessment & Plan: Pt is a 83 y.o. African-American  male with medical problems of diabetes type 2 for 4-5 years, now insulin-dependent, BPH, CAD with coronary stents placed in July 2018, CML,GERD, Gout, HTN, Hypothyroidism, who was admitted to [facility] on 12/18/2016 .   1. Acute renal failure on chronic kidney disease stage III Baseline creatinine 1.6 on July 2018 Presenting creatinine 2.40, improved to 2.06 today Home medications included Vasotec, Lasix Agree with holding Vasotec and furosemide Agree with gentle IV hydration Renal ultrasound is unremarkable Urinalysis shows glucosuria suggesting underlying diabetic nephropathy  2. Diabetes type 2 with CK D Blood sugars are poorly controlled No recent hemoglobin A1c, will order Obtain urine protein to creatinine ratio  Will follow

## 2016-12-19 NOTE — Progress Notes (Signed)
Medications administered by student RN [id number] with supervision of Clinical Instructor Andjela Wickes MSN, RN-BC or patient's assigned RN.   

## 2016-12-19 NOTE — Consult Note (Signed)
Cardiology Consultation Note  Patient ID: Lucas Caldwell, MRN: [id number], DOB/AGE: 1934/03/10 83 y.o. Admit date: 12/18/2016   Date of Consult: 12/19/2016 Primary Physician: Juanell Fairly, MD Primary Cardiologist: Dr. Saunders Revel, MD Requesting Physician: Dr. Anselm Jungling, MD  Chief Complaint: Fatigue/SOB/dysphagia Reason for Consult: Fatigue/SOB  HPI: Lucas Caldwell is a 83 y.o. male who is being seen today for the evaluation of fatigue/SOB at the request of Dr. Anselm Jungling, MD. Patient has a h/o coronary artery disease, mixed ischemic and nonischemic cardiomyopathy, hypertension, hyperlipidemia, type 2 diabetes mellitus, paroxysmal atrial fibrillation on chronic Coumadin, stage III chronic kidney disease, CML, iron deficiency anemia, BPH, and hypothyroidism who presented to [facility] with 1-2 months of dysphagia with associated fatigue and increased SOB.   He has a history of nonischemic cardiomyopathy dating back to the late 1990s. He was later diagnosed with coronary artery disease and underwent stenting 2 at [facility] in 2014. Despite prior history of cardiomyopathy, he did have normalization of LV function by echo in 2012 with repeat echo in April 2017 showing an EF of 50-55%.  In May of this year, he was admitted to [facility] with progressive dyspnea. Echo showed EF 45-50%. Catheterization showed 70% left main disease with severe right coronary artery disease. He was transferred to [facility] and was evaluated by thoracic surgery, but felt to be too high risk for surgery. He then underwent successful PCI of the right coronary artery.  It was felt that the left main should be medically managed though high risk stenting could be performed for recurrent symptoms. Re-admitted in July, 2018 for chest pain. He ruled out. Films were reviewed and Imdur was added, reserving high-risk left main PCI for recurrent symptoms. He was most recently seen in the office on 10/31/16 and was doing well at that time. He was  planning to re-enroll in cardiac rehab. He was continued in optimal medical therapy and high-risk PCI was deferred fore recurrent symptoms. He was in agreement with that plan.  Patient presented to [facility] on 10/3 with a 1-2 month history of dysphagia including liquids, weakness, fatigue, SOB, and poor PO intake. He does not have any molars and has been unable to chew his foods. He has been swallowing whole pieces of chicken, beef and breads. He has had issues passing these food boluses down. When he has difficulty swallowing he has noted some sharp abdominal/chest pain. Outside of swallowing foods and having them get stuff in his esophagus he has not had chest pain. Tolerating heart medications.   Upon his arrival to [facility], troponin negative x 2, SCr 2.4-->2.06 (baseline ~ 1.6-1.7), K+ 4.5-->4.2, digoxin 1.0, TSH normal, INR 2.40, BNP 33, WBC 8.3, HGB 8.3-->8.3 (baseline 9-10) reportedly heme positive in the ED, PLT 236. CXR showed no acute process. Barium swallow showed possible esophageal spasm physiology. EKG note acute as below. Given his acute on CKD, his Lasix and enalapril were held. He was continued on digoxin. Metoprolol was held for bradycardia. He wants to go home as he has a funeral to attend.     Past Medical History:  Diagnosis Date  . Arthritis    "probably in his legs" (08/15/2016)  . Asthma   . Blind right eye   . BPH (benign prostatic hyperplasia)   . Breast asymmetry    Left breast is larger, present for several years.  [name] CAD (coronary artery disease)    a. Cath in the late 90's - reportedly ok;  b. 2014 s/p stenting x 2 @  [facility]; c 08/19/16 Cath/PCI with DES -> RCA, plan to treat LM 70% medically. Seen by surgery and felt to be too high risk for CABG.  . Chronic combined systolic (congestive) and diastolic (congestive) heart failure ([place])    a. Previously reduced EF-->50% by echo in 2012;  b. 06/2015 Echo: EF 50-55%  c. 07/2016 Echo: EF 45-50%.  . CKD (chronic kidney disease), stage III  ([place])   . CML (chronic myelocytic leukemia) ([place])   . GERD (gastroesophageal reflux disease)   . Gout   . Hyperlipemia   . Hypertension   . Hypothyroidism   . Iron deficiency anemia   . Migraine    "in the 1960s" (08/15/2016)  . NICM (nonischemic cardiomyopathy) ([place])    a. Previously reduced EF-->50% by echo in 2012;  b. 06/2015 Echo: EF 50-55%, Gr2 DD, mild MR, mildly dil LA, Ao sclerosis, mild TR;  c. 07/2016 Echo: EF 45-50%, ? inf HK, mild MR, mildly dil LA.  [name] PAF (paroxysmal atrial fibrillation) (HCC)    a. ? Dx 2014-->s/p DCCV;  b. CHA2DS2VASc = 6-->chronic coumadin.  [name] RBBB   . Type II diabetes mellitus ([place])       Most Recent Cardiac Studies: LHC 08/14/2016: Conclusion   Conclusions: 1. Significant multivessel coronary artery disease, including 70% ostial LMCA stenosis (with catheter dampening) and 95% proximal/mid RCA lesion. 2. Patent stents in the mid LCx and mid RCA. 3. Upper normal left ventricular filling pressure.  Recommendations: 1. Given LMCA and RCA disease in the setting of diabetes mellitus, surgical consultation for CABG should be considered. However, if comorbidities preclude surgical intervention, PCI to unprotected LMCA and mid RCA could be considered. 2. Medical therapy, including gentle hydration, given chronic kidney disease. 3. Aggressive secondary prevention.   Repeat LHC 08/20/2016: Conclusion   Conclusions: 4. Moderate to severe proximal/mid RCA disease with focal 95% stenosis at proximal margin of previously placed mid-RCA stent. 5. Mild in-stent restenosis of mid RCA stent. 6. Successful PCI to proximal/mid RCA with placement of a Synergy 3.0 x 28 mm drug-eluting stent with 0% residual stenosis and TIMI-3 flow.  Recommendations: 4. Restart warfarin per pharmacy. Anticipate triple therapy with ASA, ticagrelor, and warfarin x 1 month, following by warfarin and ticagrelor. 5. Aggressive secondary prevention. 6. Medical management of non-critical  LMCA stenosis. If chest pain and shortness of breath persist, high-risk PCI to the ostial LMCA will need to be considered.   TTE 07/2016: Study Conclusions  - Left ventricle: The cavity size was mildly dilated. There was   mild concentric hypertrophy. Systolic function was mildly   reduced. The estimated ejection fraction was in the range of 45%   to 50%. Possible hypokinesis of the inferior myocardium. Left   ventricular diastolic function parameters were normal. - Mitral valve: There was mild regurgitation. - Left atrium: The atrium was mildly dilated. - Pulmonary arteries: Systolic pressure could not be accurately   estimated.   Surgical History:  Past Surgical History:  Procedure Laterality Date  . CATARACT EXTRACTION W/ INTRAOCULAR LENS  IMPLANT, BILATERAL Bilateral   . CORONARY ANGIOPLASTY WITH STENT PLACEMENT  09/2012   2 stents  . CORONARY STENT INTERVENTION N/A 08/19/2016   Procedure: Coronary Stent Intervention;  Surgeon: [name], MD;  Location: [place] [facility];  Service: Cardiovascular;  Laterality: N/A;  . HERNIA REPAIR     "navel"  . LEFT HEART CATH AND CORONARY ANGIOGRAPHY N/A 08/14/2016   Procedure: Left Heart Cath and Coronary Angiography;  Surgeon: End,  [name], MD;  Location: [place] [facility];  Service: Cardiovascular;  Laterality: N/A;  . TOE AMPUTATION Right    "big toe"     Home Meds: Prior to Admission medications   Medication Sig Start Date End Date Taking? Authorizing Provider  acetaminophen (TYLENOL) 500 MG tablet Take 500 mg by mouth every 6 (six) hours as needed.   Yes [provider]  albuterol (PROVENTIL HFA;VENTOLIN HFA) 108 (90 BASE) MCG/ACT inhaler Inhale 2 puffs into the lungs every 6 (six) hours as needed for wheezing or shortness of breath. 11/04/14  Yes Ahmed Prima, MD  atorvastatin (LIPITOR) 80 MG tablet Take 1 tablet (80 mg total) by mouth daily at 6 PM. 08/20/16  Yes Cheryln Manly, NP  bosutinib (BOSULIF)  100 MG tablet Take 200 mg by mouth at bedtime. Take with food.    Yes [provider]  digoxin (LANOXIN) 0.125 MG tablet Take 125 mcg by mouth daily.  11/10/15  Yes [provider]  enalapril (VASOTEC) 5 MG tablet Take 5 mg by mouth daily.   Yes [provider]  famotidine (PEPCID) 20 MG tablet Take 20 mg by mouth at bedtime as needed for heartburn. Take 2 hours after chemo pill.   Yes [provider]  fenofibrate 160 MG tablet Take 1 tablet (160 mg total) by mouth daily. 08/21/16  Yes Reino Bellis B, NP  furosemide (LASIX) 20 MG tablet Take 20 mg by mouth daily.   Yes [provider]  hydrocortisone 2.5 % ointment Apply topically 3 (three) times daily. 07/20/16  Yes Triplett, Cari B, FNP  isosorbide mononitrate (IMDUR) 30 MG 24 hr tablet Take 1 tablet (30 mg total) by mouth daily. 09/21/16  Yes Sainani, Belia Heman, MD  LANTUS SOLOSTAR 100 UNIT/ML Solostar Pen Inject 20 Units into the skin at bedtime. Patient taking differently: Inject 65 Units into the skin at bedtime.  12/11/15  Yes Demetrios Loll, MD  levothyroxine (SYNTHROID, LEVOTHROID) 25 MCG tablet Take 25 mcg by mouth daily before breakfast.  10/27/15  Yes [provider]  loperamide (IMODIUM A-D) 2 MG tablet Take 2 mg by mouth 3 (three) times daily as needed for diarrhea or loose stools.    Yes [provider]  magnesium oxide (MAG-OX) 400 MG tablet Take 400 mg by mouth 2 (two) times daily.   Yes [provider]  metoprolol succinate (TOPROL-XL) 50 MG 24 hr tablet Take 50 mg by mouth every morning. 12/04/15  Yes [provider]  Multiple Vitamins-Minerals (MULTIVITAMIN ADULTS 50+ PO) Take 1 tablet by mouth every morning.   Yes [provider]  nepafenac (NEVANAC) 0.1 % ophthalmic suspension Place 1 drop into both eyes 3 (three) times daily as needed.   Yes [provider]  nitroGLYCERIN (NITROSTAT) 0.4 MG SL tablet Place 1 tablet (0.4 mg total) under the  tongue every 5 (five) minutes x 3 doses as needed for chest pain. 10/02/16  Yes End, [name], MD  pregabalin (LYRICA) 50 MG capsule Take 50 mg by mouth 3 (three) times daily.   Yes [provider]  ranitidine (ZANTAC) 300 MG tablet Take 300 mg by mouth daily.   Yes [provider]  Spacer/Aero-Holding [facility] ADVANTAGE) MISC 1 each by Other route once. 11/04/14  Yes Ahmed Prima, MD  ticagrelor (BRILINTA) 90 MG TABS tablet Take 1 tablet (90 mg total) by mouth 2 (two) times daily. 10/02/16  Yes End, [name], MD  warfarin (COUMADIN) 2 MG tablet Take 3  mg by mouth at bedtime.    Yes [provider]  allopurinol (ZYLOPRIM) 300 MG tablet Take 300 mg by mouth daily.  11/10/15   [provider]    Inpatient Medications:  . atorvastatin  80 mg Oral q1800  . bosutinib  200 mg Oral QHS  . digoxin  125 mcg Oral Daily  . fenofibrate  160 mg Oral Daily  . Influenza vac split quadrivalent PF  0.5 mL Intramuscular Tomorrow-1000  . insulin aspart  0-9 Units Subcutaneous TID WC  . insulin glargine  20 Units Subcutaneous QHS  . levothyroxine  25 mcg Oral QAC breakfast  . magnesium oxide  400 mg Oral BID  . multivitamin with minerals   Oral Daily  . pregabalin  50 mg Oral TID  . ticagrelor  90 mg Oral BID  . warfarin  3 mg Oral q1800  . Warfarin - Pharmacist Dosing Inpatient   Does not apply q1800   . sodium chloride 50 mL/hr at 12/19/16 0058    Allergies:  Allergies  Allergen Reactions  . Clopidogrel Anaphylaxis    Other reaction(s): ANAPHYLAXIS;altered mental status;  (electrolytes "out of wack" and confusion per family; THEY DO NOT REMEMBER ANY OTHER REACTION)- MSB 10/10/15  . Ciprofloxacin Itching  . Spironolactone     Other reaction(s): Other (See Comments) gynecomastia  . Plavix [Clopidogrel Bisulfate] Anxiety    Social History   Social History  . Marital status: Married    Spouse name: N/A  . Number of children: N/A  . Years  of education: N/A   Occupational History  . 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   Social History Main Topics  . Smoking status: Former Smoker    Packs/day: 2.00    Years: 15.00    Types: Cigarettes  . Smokeless tobacco: Never Used     Comment: quit 1979  . Alcohol use No     Comment: previously drank heavily - quit 1979.  . Drug use: No  . Sexual activity: No   Other Topics Concern  . Not on file   Social History Narrative  . No narrative on file     Family History  Problem Relation Age of Onset  . Brain cancer Father   . Diabetes Sister   . Heart attack Sister      Review of Systems: Review of Systems  Constitutional: Positive for malaise/fatigue. Negative for chills, diaphoresis, fever and weight loss.  HENT: Negative for congestion.   Eyes: Negative for discharge and redness.  Respiratory: Positive for shortness of breath. Negative for cough, hemoptysis, sputum production and wheezing.   Cardiovascular: Negative for chest pain, palpitations, orthopnea, claudication, leg swelling and PND.  Gastrointestinal: Negative for abdominal pain, blood in stool, constipation, diarrhea, heartburn, melena, nausea and vomiting.       Dysphagia  Genitourinary: Negative for hematuria.  Musculoskeletal: Negative for falls and myalgias.  Skin: Negative for rash.  Neurological: Positive for weakness. Negative for dizziness, tingling, tremors, sensory change, speech change, focal weakness and loss of consciousness.  Endo/Heme/Allergies: Does not bruise/bleed easily.  Psychiatric/Behavioral: Negative for substance abuse. The patient is not nervous/anxious.   All other systems reviewed and are negative.   Labs:  Recent Labs  12/18/16 1456 12/18/16 1835  TROPONINI <0.03 <0.03   Lab Results  Component Value Date   WBC 8.2 12/19/2016   HGB 8.7 (L) 12/19/2016   HCT 25.5 (L) 12/19/2016   MCV 76.4 (L) 12/19/2016   PLT  236 12/19/2016    Recent Labs Lab 12/19/16 0009    NA 137  K 4.2  CL 105  CO2 24  BUN 31*  CREATININE 2.06*  CALCIUM 9.0  GLUCOSE 239*   Lab Results  Component Value Date   CHOL 107 08/16/2016   HDL 24 (L) 08/16/2016   LDLCALC 15 08/16/2016   TRIG 338 (H) 08/16/2016   No results found for: DDIMER  Radiology/Studies:  Dg Chest 2 View  Result Date: 12/18/2016 IMPRESSION: Stable radiographic appearance of the chest. No acute cardiopulmonary process. Electronically Signed   By: Richardean Sale M.D.   On: 12/18/2016 14:39   Dg Esophagus  Result Date: 12/19/2016 IMPRESSION: 1. Tertiary contractions of the distal third of the esophagus as can be seen with esophageal spasm. 2. No esophageal stricture. Electronically Signed   By: Kathreen Devoid   On: 12/19/2016 09:00    EKG: Interpreted by me showed: NSR, 65 bpm, RBBB, left anterior fascicular block Telemetry: Interpreted by me showed: sinus bradycardia, 50s bpm, occasional blocked PAC, RBBB  Weights: Filed Weights   12/18/16 1351 12/18/16 2117  Weight: 196 lb (88.9 kg) 196 lb 1.6 oz (89 kg)     Physical Exam: Blood pressure (!) 150/60, pulse (!) 57, temperature 98.1 F (36.7 C), temperature source Oral, resp. rate 20, height 5\' 6"  (1.676 m), weight 196 lb 1.6 oz (89 kg), SpO2 100 %. Body mass index is 31.65 kg/m. General: Well developed, well nourished, in no acute distress. Head: Normocephalic, atraumatic, sclera non-icteric, no xanthomas, nares are without discharge.  Neck: Negative for carotid bruits. JVD not elevated. Lungs: Clear bilaterally to auscultation without wheezes, rales, or rhonchi. Breathing is unlabored. Heart: Bradycardic, with S1 S2. No murmurs, rubs, or gallops appreciated. Abdomen: Soft, non-tender, non-distended with normoactive bowel sounds. No hepatomegaly. No rebound/guarding. No obvious abdominal masses. Msk:  Strength and tone appear normal for age. Extremities: No clubbing or cyanosis. No edema. Distal pedal pulses are 2+ and equal  bilaterally. Neuro: Alert and oriented X 3. No facial asymmetry. No focal deficit. Moves all extremities spontaneously. Psych:  Responds to questions appropriately with a normal affect.    Assessment and Plan:  Principal Problem:   Acute renal failure (ARF) (HCC) Active Problems:   Fatigue    1. Fatigue/SOB: -Likely multifactorial including acute on CKD, worsening anemia, likely dehydration and poor PO intake from his dysphagia and possibly mild bradycardia -Hold metoprolol and digoxin as below -Supportive care as below  2. CAD: -Troponin negative -Never with chest pain outside of swallowing food boluses  -This does not appear to be a primary cardiac event -Continue medical therapy for known CAD, no plans for inpatient high-risk PCT of the left main at this time as these episodes appear to be related to esophageal spasms  -Echo pending per IM -Continue Brilinta with Coumadin (not on ASA currently given need for Coumadin with his Afib) -Restart Imdur (not continued at admission)  3. PAF: -Remaining in sinus rhythm with mildly bradycardic rates in the 50s bpm -Hold digoxin given his acute on CKD with SCr 2.4-->2.06 -Metoprolol on hold at time of admission by IM given bradycardia -Would restart metoprolol without digoxin as heart rate allows -Coumadin per pharmacy   4. Acute on CKD stage III: -Likely playing a role in his fatigue -Gentle IV fluids -Renal to see per IM  5. Bradycardia: -Hold digoxin and metoprolol as above -Monitor on tele  6. Hypothyroidism: -TSH normal -Continue replacement therapy per IM  7.  Acute on anemia of chronic disease/CML: -Likely playing a role in his fatigue -Apparently heme positive int he ED -Maintain HGB > 8.5 -Per IM  8. Dysphagia: -Barium swallow with esophageal spasm physiology -IM has consulted GI -Continue long-acting nitro (not continued at admission)   Signed, [name], PA-C [facility] Pager: [phone] 12/19/2016, 9:54 AM

## 2016-12-19 NOTE — Progress Notes (Signed)
   12/19/16 0700  Clinical Encounter Type  Visited With Patient  Visit Type Initial;Spiritual support  Referral From Nurse  Spiritual Encounters  Spiritual Needs Prayer;Emotional  [facility] visited with patient at bedside; [place] offered prayer, spiritual and emotional support.

## 2016-12-19 NOTE — Evaluation (Signed)
Clinical/Bedside Swallow Evaluation Patient Details  Name: Lucas Caldwell MRN: [id number] Date of Birth: Jul 11, 1933  Today's Date: 12/19/2016 Time: SLP Start Time (ACUTE ONLY): 0945 SLP Stop Time (ACUTE ONLY): 1030 SLP Time Calculation (min) (ACUTE ONLY): 45 min  Past Medical History:  Past Medical History:  Diagnosis Date  . Arthritis    "probably in his legs" (08/15/2016)  . Asthma   . Blind right eye   . BPH (benign prostatic hyperplasia)   . Breast asymmetry    Left breast is larger, present for several years.  [name] CAD (coronary artery disease)    a. Cath in the late 90's - reportedly ok;  b. 2014 s/p stenting x 2 @ [facility]; c 08/19/16 Cath/PCI with DES -> RCA, plan to treat LM 70% medically. Seen by surgery and felt to be too high risk for CABG.  . Chronic combined systolic (congestive) and diastolic (congestive) heart failure ([place])    a. Previously reduced EF-->50% by echo in 2012;  b. 06/2015 Echo: EF 50-55%  c. 07/2016 Echo: EF 45-50%.  . CKD (chronic kidney disease), stage III ([place])   . CML (chronic myelocytic leukemia) ([place])   . GERD (gastroesophageal reflux disease)   . Gout   . Hyperlipemia   . Hypertension   . Hypothyroidism   . Iron deficiency anemia   . Migraine    "in the 1960s" (08/15/2016)  . NICM (nonischemic cardiomyopathy) ([place])    a. Previously reduced EF-->50% by echo in 2012;  b. 06/2015 Echo: EF 50-55%, Gr2 DD, mild MR, mildly dil LA, Ao sclerosis, mild TR;  c. 07/2016 Echo: EF 45-50%, ? inf HK, mild MR, mildly dil LA.  [name] PAF (paroxysmal atrial fibrillation) ([facility])    a. ? Dx 2014-->s/p DCCV;  b. CHA2DS2VASc = 6-->chronic coumadin.  [name] RBBB   . Type II diabetes mellitus ([place])    Past Surgical History:  Past Surgical History:  Procedure Laterality Date  . CATARACT EXTRACTION W/ INTRAOCULAR LENS  IMPLANT, BILATERAL Bilateral   . CORONARY ANGIOPLASTY WITH STENT PLACEMENT  09/2012   2 stents  . CORONARY STENT INTERVENTION N/A 08/19/2016   Procedure: Coronary Stent  Intervention;  Surgeon: [name], MD;  Location: [facility];  Service: Cardiovascular;  Laterality: N/A;  . HERNIA REPAIR     "navel"  . LEFT HEART CATH AND CORONARY ANGIOGRAPHY N/A 08/14/2016   Procedure: Left Heart Cath and Coronary Angiography;  Surgeon: [name], MD;  Location: [facility];  Service: Cardiovascular;  Laterality: N/A;  . TOE AMPUTATION Right    "big toe"   HPI:  Pt is a 83 y.o. male with a known history of multiple medical issues including GERD, migraines, blindness in Right eye, Asthma, DM, NICM, BPH, CAD, stent in June 2018, CML. CKD stage 3, Htn, HLD, Hypothyroidism, DM- for last 1-2 months have difficult in swallowing food, which is progressed now, with liquids too for last 2-3 weeks - pt has a baseline of GERD per chart. Also have c/o generalized weakness, fatigue and SOB with minimal exertion of just moving around in the house - not necessarily w/ meals. Pt is missing molars and notices he has to "chew a longer time w/ meats" to break them down to swallow them.   Assessment / Plan / Recommendation Clinical Impression  Pt appears to present w/ adequate oropharyngeal phase swallow function w/ all consistencies; reduced risk for aspiration of po's from an oropharyngeal phase standpoint. Pt consumed po trials w/ no  overt s/s of aspiration noted; clear vocal quality and no decline in respiratory status during/post trials. Oral phase appeared [facility] for trial consistencies given which were Mech Soft in nature and moistened well. Pt is missing most molars which inhibits efficient/sufficient mastication of tougher solids (Meats, Breads) which increases chance that pt is swallowing foods less broken down - this could impact the ESOPHAGEAL phase of swallowing. Also noted the Barium Study results (this AM) reporting Tertiary contractions of the distal third of the esophagus as can be seen with esophageal spasms; pt also has a baseline dx of GERD per chart.  This ESOPHAGEAL presentation can increase risk for bolus dysmotility and regurgitation which can increase risk for choking/aspirating of REFLUX material. Thorough education given on general Reflux precautions; aspiration precautions. Educated pt on the need to monitor food consistencies especially meats and breads and moistening/breaking down thoroughly. Recommend a Mech Soft diet w/ thin liquids; rest breaks during oral intake - Slow Down, small bites/sips.  SLP Visit Diagnosis: Dysphagia, unspecified (R13.10) (ESOPHAGEAL phase dysmotility)    Aspiration Risk   (reduced from an oropharyngeal phase standpoint)    Diet Recommendation  Mech Soft diet consistency - moistened well and broken down; Thin liquids. General aspiration precautions; REFLUX precautions.  Medication Administration: Whole meds with liquid    Other  Recommendations Recommended Consults: Consider GI evaluation;Consider esophageal assessment Oral Care Recommendations: Oral care BID;Patient independent with oral care Other Recommendations:  (n/a)   Follow up Recommendations None      Frequency and Duration  (n/a)   (n/a)       Prognosis Prognosis for Safe Diet Advancement: Good Barriers to Reach Goals:  (n/a)      Swallow Study   General Date of Onset: 12/18/16 HPI: Pt is a 83 y.o. male with a known history of multiple medical issues including GERD, migraines, blindness in Right eye, Asthma, DM, NICM, BPH, CAD, stent in June 2018, CML. CKD stage 3, Htn, HLD, Hypothyroidism, DM- for last 1-2 months have difficult in swallowing food, which is progressed now, with liquids too for last 2-3 weeks - pt has a baseline of GERD per chart. Also have c/o generalized weakness, fatigue and SOB with minimal exertion of just moving around in the house - not necessarily w/ meals. Pt is missing molars and notices he has to "chew a longer time w/ meats" to break them down to swallow them. Type of Study: Bedside Swallow  Evaluation Previous Swallow Assessment: none Diet Prior to this Study: Regular;Thin liquids Temperature Spikes Noted: No (wbc 8.2) Respiratory Status: Room air History of Recent Intubation: No Behavior/Cognition: Alert;Cooperative;Pleasant mood Oral Cavity Assessment: Within Functional Limits Oral Care Completed by SLP: Recent completion by staff Oral Cavity - Dentition: Adequate natural dentition;Missing dentition (most molars) Vision: Functional for self-feeding (but deficits (baseline) in Right eye) Self-Feeding Abilities: Able to feed self;Needs set up (min) Patient Positioning: Upright in bed Baseline Vocal Quality: Normal Volitional Cough: Strong Volitional Swallow: Able to elicit    Oral/Motor/Sensory Function Overall Oral Motor/Sensory Function: Within functional limits   Ice Chips Ice chips: Not tested   Thin Liquid Thin Liquid: Within functional limits Presentation: Self Fed;Cup;Straw (~4 ozs total)    Nectar Thick Nectar Thick Liquid: Not tested   Honey Thick Honey Thick Liquid: Not tested   Puree Puree: Within functional limits Presentation: Self Fed;Spoon (5 trials)   Solid   GO   Solid: Within functional limits (but moistened and mech soft inconsistency) Presentation: Self Fed;Spoon (4 trials)  Functional Assessment Tool Used: clinical judgement Functional Limitations: Swallowing Swallow Current Status (P5361): At least 1 percent but less than 20 percent impaired, limited or restricted Swallow Goal Status [phone]): At least 1 percent but less than 20 percent impaired, limited or restricted Swallow Discharge Status [phone]): At least 1 percent but less than 20 percent impaired, limited or restricted    Orinda Kenner, MS, CCC-SLP Watson,Katherine 12/19/2016,11:33 AM

## 2016-12-19 NOTE — Progress Notes (Signed)
Inpatient Diabetes Program Recommendations  AACE/ADA: New Consensus Statement on Inpatient Glycemic Control (2015)  Target Ranges:  Prepandial:   less than 140 mg/dL      Peak postprandial:   less than 180 mg/dL (1-2 hours)      Critically ill patients:  140 - 180 mg/dL   Results for [name], [name] (MRN [id number]) as of 12/19/2016 10:13  Ref. Range 12/18/2016 22:00 12/19/2016 00:54 12/19/2016 07:38  Glucose-Capillary Latest Ref Range: 65 - 99 mg/dL 229 (H) 243 (H) 222 (H)   Review of Glycemic Control  Diabetes history: DM2 Outpatient Diabetes medications: Lantus 65 units QHS Current orders for Inpatient glycemic control: Lantus 20 units QHS, Novolog 0-9 units TID with meals  Inpatient Diabetes Program Recommendations: Insulin - Basal: Please consider increasing Lantus to 23 units QHS (based on 89 kg x 0.25 units). Correction (SSI): Please consider ordering Novolog 0-5 units QHS for bedtime correction.  Thanks, Barnie Alderman, RN, MSN, CDE Diabetes Coordinator Inpatient Diabetes Program [phone] (Team Pager from 8am to 5pm)

## 2016-12-19 NOTE — Progress Notes (Signed)
[facility] [place] NAME: Gregorey Nabor    MR#:  [id number]  DATE OF BIRTH:  28-Nov-1933  SUBJECTIVE:  CHIEF COMPLAINT:   Chief Complaint  Patient presents with  . Shortness of Breath  wants to go home (has funeral to attend), no complaints REVIEW OF SYSTEMS:  Review of Systems  Constitutional: Negative for chills, fever and weight loss.  HENT: Negative for nosebleeds and sore throat.   Eyes: Negative for blurred vision.  Respiratory: Negative for cough, shortness of breath and wheezing.   Cardiovascular: Negative for chest pain, orthopnea, leg swelling and PND.  Gastrointestinal: Negative for abdominal pain, constipation, diarrhea, heartburn, nausea and vomiting.       Dysphagia  Genitourinary: Negative for dysuria and urgency.  Musculoskeletal: Negative for back pain.  Skin: Negative for rash.  Neurological: Negative for dizziness, speech change, focal weakness and headaches.  Endo/Heme/Allergies: Does not bruise/bleed easily.  Psychiatric/Behavioral: Negative for depression.   DRUG ALLERGIES:   Allergies  Allergen Reactions  . Clopidogrel Anaphylaxis    Other reaction(s): ANAPHYLAXIS;altered mental status;  (electrolytes "out of wack" and confusion per family; THEY DO NOT REMEMBER ANY OTHER REACTION)- MSB 10/10/15  . Ciprofloxacin Itching  . Spironolactone     Other reaction(s): Other (See Comments) gynecomastia  . Plavix [Clopidogrel Bisulfate] Anxiety   VITALS:  Blood pressure (!) 150/60, pulse (!) 57, temperature 98.1 F (36.7 C), temperature source Oral, resp. rate 20, height 5\' 6"  (1.676 m), weight 89 kg (196 lb 1.6 oz), SpO2 100 %. PHYSICAL EXAMINATION:  Physical Exam  Constitutional: [name] oriented to person, place, and time and well-developed, well-nourished, and in no distress.  HENT:  Head: Normocephalic and atraumatic.  Eyes: Pupils are equal, round, and reactive to light. Conjunctivae and EOM are normal.    Neck: Normal range of motion. Neck supple. No tracheal deviation present. No thyromegaly present.  Cardiovascular: Normal rate, regular rhythm and normal heart sounds.   Pulmonary/Chest: Effort normal and breath sounds normal. No respiratory distress. He has no wheezes. He exhibits no tenderness.  Abdominal: Soft. Bowel sounds are normal. He exhibits no distension. There is no tenderness.  Musculoskeletal: Normal range of motion.  Neurological: [name] alert and oriented to person, place, and time. No cranial nerve deficit.  Skin: Skin is warm and dry. No rash noted.  Psychiatric: Mood and affect normal.   LABORATORY PANEL:  Male CBC  Recent Labs Lab 12/19/16 0009  WBC 8.2  HGB 8.7*  HCT 25.5*  PLT 236   ------------------------------------------------------------------------------------------------------------------ Chemistries   Recent Labs Lab 12/19/16 0009  NA 137  K 4.2  CL 105  CO2 24  GLUCOSE 239*  BUN 31*  CREATININE 2.06*  CALCIUM 9.0   RADIOLOGY:  Dg Chest 2 View  Result Date: 12/18/2016 CLINICAL DATA:  Increasing shortness of breath over the last 4 months. History of coronary artery disease with stent placement, chronic kidney disease and hypertension. EXAM: CHEST  2 VIEW COMPARISON:  09/19/2016 and 08/16/2016. FINDINGS: The heart size and mediastinal contours are stable. There is atherosclerosis of the aorta. A coronary stent is noted on the lateral view. Stable borderline interstitial coarsening on the frontal examination without corresponding finding on the lateral view. There is no edema, confluent airspace opacity or pleural effusion. The bones appear unchanged. IMPRESSION: Stable radiographic appearance of the chest. No acute cardiopulmonary process. Electronically Signed   By: Richardean Sale M.D.   On: 12/18/2016 14:39   Dg  Esophagus  Result Date: 12/19/2016 CLINICAL DATA:  Dysphagia EXAM: ESOPHOGRAM / BARIUM SWALLOW / BARIUM TABLET STUDY TECHNIQUE:  Combined double contrast and single contrast examination performed using effervescent crystals, thick barium liquid, and thin barium liquid. The patient was observed with fluoroscopy swallowing a 13 mm barium sulphate tablet. FLUOROSCOPY TIME:  Fluoroscopy Time:  0.9 minutes Radiation Exposure Index (if provided by the fluoroscopic device): 11.5 mGy Number of Acquired Spot Images: 0 COMPARISON:  None. FINDINGS: There was normal pharyngeal anatomy and motility. Contrast flowed freely through the esophagus without evidence of stricture or mass. There was normal esophageal mucosa without evidence of irregularity or ulceration. Tertiary contractions of the distal third of the esophagus as can be seen with esophageal spasm. No evidence of reflux. No definite hiatal hernia was demonstrated. At the end of the examination a 13 mm barium tablet was administered which transited through the esophagus and esophagogastric junction without delay. IMPRESSION: 1. Tertiary contractions of the distal third of the esophagus as can be seen with esophageal spasm. 2. No esophageal stricture. Electronically Signed   By: Kathreen Devoid   On: 12/19/2016 09:00   ASSESSMENT AND PLAN:  83 y.o. male with a known history of Asthma, BPH, CAD, stent in June 2018, CML. CKD stage 3, Htn, HLD, Hypothyroidism, DM- for last 1-2 months have difficult in swallowing food, which is progressed now, with liquids too for last 2-3 weeks. Also have c/o generalized weakness, fatigue and SOB with minimal exertion.   * Ac on CKD stage 3 - Nephro c/s -  Hold Diuretics, Htn meds and ACE inhibitors.  * Anemia of chronic kidney disease:  - Also has history of CML -  Now fatigue and swallowing difficulty   Oncology consult.   No need for transfusion, but as have Hx of CAD and stent- may transfuse < 8.  * Dysphagia - First solid and now with liquids also - Barium swallow was esophageal spasm in the lower part. - GI consult. - Due to recent cath ,  stent- may not be able to stop [place]- and GI need to decide- if need endoscopy or not.  * CAD   Cont cardiac meds except Metoprolol as he is bradycardic  * Bradycardia - HR around 50, c/o fatigue -  Hold metoprolol for now. - TSH wnl -Hold off repeating Echo, pending cardio consult.  * DM -Continue insulin sliding scale, Lantus 20 units daily  *Hypothyroidism -Continue Synthroid 25 mcg daily.  TSH within normal limits     All the records are reviewed and case discussed with Care Management/Social Worker. Management plans discussed with the patient, family (wife at bedside) and they are in agreement.  CODE STATUS: Full Code  TOTAL TIME TAKING CARE OF THIS PATIENT: 35 minutes.   More than 50% of the time was spent in counseling/coordination of care: YES  POSSIBLE D/C IN 1-2 DAYS, DEPENDING ON CLINICAL CONDITION.   Max Sane M.D on 12/19/2016 at 9:57 AM  Between 7am to 6pm - Pager - [phone]  After 6pm go to [email or web address] - [employer] brewer [facility]  Office  [phone]  CC: Primary care physician; Juanell Fairly, MD  Note: This dictation was prepared with Dragon dictation along with smaller phrase technology. Any transcriptional errors that result from this process are unintentional.

## 2016-12-20 ENCOUNTER — Encounter: Payer: Self-pay | Admitting: Anesthesiology

## 2016-12-20 ENCOUNTER — Encounter
Admission: EM | Disposition: A | Discharge status: Home / Self Care | Payer: Self-pay | Source: Home / Self Care | Attending: Internal Medicine

## 2016-12-20 ENCOUNTER — Telehealth: Payer: Self-pay | Admitting: Internal Medicine

## 2016-12-20 DIAGNOSIS — R5383 Other fatigue: Secondary | ICD-10-CM

## 2016-12-20 DIAGNOSIS — K222 Esophageal obstruction: Secondary | ICD-10-CM | POA: Diagnosis not present

## 2016-12-20 DIAGNOSIS — R001 Bradycardia, unspecified: Secondary | ICD-10-CM

## 2016-12-20 DIAGNOSIS — I25118 Atherosclerotic heart disease of native coronary artery with other forms of angina pectoris: Secondary | ICD-10-CM | POA: Diagnosis not present

## 2016-12-20 DIAGNOSIS — K3189 Other diseases of stomach and duodenum: Secondary | ICD-10-CM | POA: Diagnosis not present

## 2016-12-20 DIAGNOSIS — R1310 Dysphagia, unspecified: Secondary | ICD-10-CM | POA: Diagnosis not present

## 2016-12-20 DIAGNOSIS — E860 Dehydration: Secondary | ICD-10-CM | POA: Diagnosis not present

## 2016-12-20 DIAGNOSIS — N179 Acute kidney failure, unspecified: Secondary | ICD-10-CM | POA: Diagnosis not present

## 2016-12-20 DIAGNOSIS — D649 Anemia, unspecified: Secondary | ICD-10-CM | POA: Diagnosis not present

## 2016-12-20 DIAGNOSIS — Z23 Encounter for immunization: Secondary | ICD-10-CM | POA: Diagnosis present

## 2016-12-20 HISTORY — PX: ESOPHAGOGASTRODUODENOSCOPY: SHX5428

## 2016-12-20 LAB — CBC
HEMATOCRIT: 25.3 % — AB (ref 40.0–52.0)
HEMOGLOBIN: 8.3 g/dL — AB (ref 13.0–18.0)
MCH: 25.2 pg — ABNORMAL LOW (ref 26.0–34.0)
MCHC: 32.9 g/dL (ref 32.0–36.0)
MCV: 76.5 fL — ABNORMAL LOW (ref 80.0–100.0)
Platelets: 222 10*3/uL (ref 150–440)
RBC: 3.31 MIL/uL — AB (ref 4.40–5.90)
RDW: 16.7 % — ABNORMAL HIGH (ref 11.5–14.5)
WBC: 7.1 10*3/uL (ref 3.8–10.6)

## 2016-12-20 LAB — BASIC METABOLIC PANEL
ANION GAP: 7 (ref 5–15)
BUN: 25 mg/dL — ABNORMAL HIGH (ref 6–20)
CHLORIDE: 106 mmol/L (ref 101–111)
CO2: 23 mmol/L (ref 22–32)
CREATININE: 1.96 mg/dL — AB (ref 0.61–1.24)
Calcium: 8.9 mg/dL (ref 8.9–10.3)
GFR calc non Af Amer: 30 mL/min — ABNORMAL LOW (ref >60)
GFR, EST AFRICAN AMERICAN: 35 mL/min — AB (ref >60)
Glucose, Bld: 231 mg/dL — ABNORMAL HIGH (ref 65–99)
Potassium: 4.2 mmol/L (ref 3.5–5.1)
SODIUM: 136 mmol/L (ref 135–145)

## 2016-12-20 LAB — PROTIME-INR
INR: 2.29
PROTHROMBIN TIME: 25 s — AB (ref 11.4–15.2)

## 2016-12-20 LAB — HEMOGLOBIN A1C
HEMOGLOBIN A1C: 10.4 % — AB (ref 4.8–5.6)
MEAN PLASMA GLUCOSE: 251.78 mg/dL

## 2016-12-20 LAB — VITAMIN B12: VITAMIN B 12: 688 pg/mL (ref 180–914)

## 2016-12-20 LAB — GLUCOSE, CAPILLARY: Glucose-Capillary: 191 mg/dL — ABNORMAL HIGH (ref 65–99)

## 2016-12-20 SURGERY — EGD (ESOPHAGOGASTRODUODENOSCOPY)

## 2016-12-20 MED ORDER — BUTAMBEN-TETRACAINE-BENZOCAINE 2-2-14 % EX AERO
INHALATION_SPRAY | CUTANEOUS | Status: DC | PRN
  Administered 2016-12-20: 1 via TOPICAL

## 2016-12-20 MED ORDER — BUTAMBEN-TETRACAINE-BENZOCAINE 2-2-14 % EX AERO
INHALATION_SPRAY | CUTANEOUS | Status: AC
  Filled 2016-12-20: qty 5

## 2016-12-20 MED ORDER — PANTOPRAZOLE SODIUM 40 MG PO TBEC: 40.0000 mg | DELAYED_RELEASE_TABLET | Freq: Every day | ORAL | 0 refills | Status: DC

## 2016-12-20 MED ORDER — ENALAPRIL MALEATE 5 MG PO TABS
5.0000 mg | ORAL_TABLET | Freq: Every evening | ORAL | Status: DC
  Filled 2016-12-20: qty 1

## 2016-12-20 MED ORDER — SODIUM CHLORIDE 0.9 % IV SOLN
510.0000 mg | Freq: Once | INTRAVENOUS | Status: AC
  Administered 2016-12-20: 11:00:00 510 mg via INTRAVENOUS
  Filled 2016-12-20: qty 17

## 2016-12-20 MED ORDER — LIDOCAINE HCL 2 % EX GEL
CUTANEOUS | Status: AC
  Filled 2016-12-20: qty 5

## 2016-12-20 NOTE — Care Management Important Message (Signed)
Important Message  Patient Details  Name: Lucas Caldwell MRN: [id number] Date of Birth: [birth year]   Medicare Important Message Given:  N/A - LOS <3 / Initial given by admissions    Katrina Stack, RN 12/20/2016, 4:26 PM

## 2016-12-20 NOTE — Progress Notes (Signed)
ANTICOAGULATION CONSULT NOTE - Initial Consult  Pharmacy Consult for warfarin Indication: atrial fibrillation  Allergies  Allergen Reactions  . Clopidogrel Anaphylaxis    Other reaction(s): ANAPHYLAXIS;altered mental status;  (electrolytes "out of wack" and confusion per family; THEY DO NOT REMEMBER ANY OTHER REACTION)- MSB 10/10/15  . Ciprofloxacin Itching  . Spironolactone     Other reaction(s): Other (See Comments) gynecomastia  . Plavix [Clopidogrel Bisulfate] Anxiety    Patient Measurements: Height: 5\' 6"  (167.6 cm) Weight: 196 lb 1.6 oz (89 kg) IBW/kg (Calculated) : 63.8  Vital Signs: Temp: 98.3 F (36.8 C) (10/05 0458) Temp Source: Oral (10/05 0458) BP: 136/57 (10/05 0458) Pulse Rate: 61 (10/05 0458)  Labs:  Recent Labs  12/18/16 1456 12/18/16 1835 12/19/16 0009 12/20/16 0430  HGB 8.3*  --  8.7* 8.3*  HCT 25.1*  --  25.5* 25.3*  PLT 227  --  236 222  LABPROT 26.0*  --   --  25.0*  INR 2.40  --   --  2.29  CREATININE 2.40*  --  2.06* 1.96*  TROPONINI <0.03 <0.03  --   --     Estimated Creatinine Clearance: 30.9 mL/min (A) (by C-G formula based on SCr of 1.96 mg/dL (H)).  Assessment: Pharmacy consulted to monitor and dose warfarin in this 83 year old male who was taking warfarin PTA for atrial fibrillation.   Confirmed home dose of warfarin 3 mg PO Daily with patient and family. INR = 2.4 is therapeutic on admission  Dosing History: Date INR Dose 10/3 2.4 3 mg 10/4 - 3 mg 10/5 2.3  Goal of Therapy:  INR 2-3 Monitor platelets by anticoagulation protocol: Yes   Plan:  INR remains therapeutic. Continue home dose of warfarin 3 mg PO daily. Patient is not currently on antibiotics and has not been started on any new medications with significant DDI with warfarin. Has had two consecutive therapeutic INRs, so daily INR not warranted.  Will recheck INR on 10/7.  Lenis Noon, PharmD, BCPS Clinical Pharmacist 12/20/2016,7:28 AM

## 2016-12-20 NOTE — Discharge Instructions (Signed)
Esophageal Stricture Esophageal stricture is a condition that causes the esophagus to become narrow. The esophagus is the long tube in your throat that carries food and liquid from your mouth to your stomach. Esophageal stricture can make it difficult, painful, or even impossible to swallow. The condition also makes choking more likely. What are the causes? Gastroesophageal reflux disease (GERD) is the most common cause of esophageal stricture. In GERD, stomach acid backs up into the esophagus. Over time, this causes scar tissue and leads to narrowing (stricture). Other causes of esophageal stricture include:  Scarring from ingesting a harmful substance.  Damage from medical instruments used in the esophagus.  Radiation therapy.  Cancer.  What increases the risk? You are at greater risk for esophageal stricture if you have GERD or esophageal cancer. What are the signs or symptoms? Signs and symptoms of esophageal stricture include:  Difficulty swallowing.  Pain when swallowing.  Heartburn.  Vomiting or spitting up (regurgitating) food or liquids.  Weight loss.  How is this diagnosed? Your health care provider may suspect esophageal stricture based on your symptoms. A physical exam will also be done. You may need tests to confirm the diagnosis. These can include:  Upper endoscopy. Your health care provider will insert a flexible tube with a tiny camera on it (endoscope) into your esophagus to check for a stricture. A tissue sample may also be taken to be examined under a microscope (biopsy).  Esophageal pH monitoring. This test involves collecting acid in the esophagus with a tube to determine how much stomach acid is entering the esophagus.  Barium swallow test. For this test, you will drink a barium solution that coats the lining of the esophagus. Then you will have an X-ray taken. The barium solution helps to show if there is stricture.  How is this treated? Treatment for  esophageal stricture depends on what is causing your condition and how severe it is. Treatment options include:  Esophageal dilatation. In this procedure, a health care provider inserts an endoscope or a tool called a dilator into your esophagus to gently stretch it and make the opening wider.  Stents. In some cases, your health care provider may place a small device (stent) in the esophagus to keep it open.  Acid-blocking medicines. Taking these helps manage GERD symptoms after an esophageal stricture. This can prevent the stricture from returning.  Follow these instructions at home:  Do not drink alcohol.  Do not use any tobacco products, including cigarettes, chewing tobacco, or electronic cigarettes. If you need help quitting, ask your health care provider.  Lose weight if you are overweight.  Wear loose, comfortable clothing.  Do not eat for 3 hours before bedtime.  Elevate your head in bed with pillows.  Do not overeat at meals.  Do not eat foods that make reflux worse. These include: ? Fatty foods. ? Spicy foods. ? Soda. ? Tomato products. ? Chocolate. Contact a health care provider if:  You have problems eating or swallowing.  You regurgitate food and liquid. This information is not intended to replace advice given to you by your health care provider. Make sure you discuss any questions you have with your health care provider. Document Released: 11/12/2005 Document Revised: 08/10/2015 Document Reviewed: 07/14/2013 Elsevier Interactive Patient Education  [employer].

## 2016-12-20 NOTE — Progress Notes (Signed)
[facility], [place] 12/20/16  Subjective:   Patient is doing fair today Denies any acute complaints. No shortness of breath No significant leg swelling Serum creatinine slightly improved to 1.96  Objective:  Vital signs in last 24 hours:  Temp:  [97.9 F (36.6 C)-98.3 F (36.8 C)] 97.9 F (36.6 C) (10/05 0834) Pulse Rate:  [55-61] 56 (10/05 0834) Resp:  [15-18] 16 (10/05 0834) BP: (119-138)/(48-60) 119/60 (10/05 0834) SpO2:  [98 %-100 %] 98 % (10/05 0834)  Weight change:  Filed Weights   12/18/16 1351 12/18/16 2117  Weight: 88.9 kg (196 lb) 89 kg (196 lb 1.6 oz)    Intake/Output:    Intake/Output Summary (Last 24 hours) at 12/20/16 1123 Last data filed at 12/20/16 0855  Gross per 24 hour  Intake              888 ml  Output              725 ml  Net              163 ml    Physical Exam: General:  No acute distress, laying in the bed   HEENT Moist oral mucous membranes  Neck:  No thyromegaly, no distended neck veins  Lungs: Normal breathing effort, clear to auscultation  Heart::  No rub or gallop  Abdomen: Soft, nontender  Extremities:  Trace edema  Neurologic: Alert, oriented  Skin: Dry skin      Basic Metabolic Panel:   Recent Labs Lab 12/18/16 1456 12/19/16 0009 12/20/16 0430  NA 133* 137 136  K 4.5 4.2 4.2  CL 101 105 106  CO2 25 24 23   GLUCOSE 359* 239* 231*  BUN 35* 31* 25*  CREATININE 2.40* 2.06* 1.96*  CALCIUM 8.9 9.0 8.9     CBC:  Recent Labs Lab 12/18/16 1456 12/19/16 0009 12/20/16 0430  WBC 8.3 8.2 7.1  HGB 8.3* 8.7* 8.3*  HCT 25.1* 25.5* 25.3*  MCV 77.3* 76.4* 76.5*  PLT 227 236 222     No results found for: HEPBSAG, HEPBSAB, HEPBIGM    Microbiology:  No results found for this or any previous visit (from the past 240 hour(s)).  Coagulation Studies:  Recent Labs  12/18/16 1456 12/20/16 0430  LABPROT 26.0* 25.0*  INR 2.40 2.29    Urinalysis:  Recent Labs  12/18/16 1835   COLORURINE YELLOW*  LABSPEC 1.012  PHURINE 6.0  GLUCOSEU >=500*  HGBUR NEGATIVE  BILIRUBINUR NEGATIVE  KETONESUR NEGATIVE  PROTEINUR NEGATIVE  NITRITE NEGATIVE  LEUKOCYTESUR NEGATIVE      Imaging: Dg Chest 2 View  Result Date: 12/18/2016 CLINICAL DATA:  Increasing shortness of breath over the last 4 months. History of coronary artery disease with stent placement, chronic kidney disease and hypertension. EXAM: CHEST  2 VIEW COMPARISON:  09/19/2016 and 08/16/2016. FINDINGS: The heart size and mediastinal contours are stable. There is atherosclerosis of the aorta. A coronary stent is noted on the lateral view. Stable borderline interstitial coarsening on the frontal examination without corresponding finding on the lateral view. There is no edema, confluent airspace opacity or pleural effusion. The bones appear unchanged. IMPRESSION: Stable radiographic appearance of the chest. No acute cardiopulmonary process. Electronically Signed   By: Richardean Sale M.D.   On: 12/18/2016 14:39   Dg Esophagus  Result Date: 12/19/2016 CLINICAL DATA:  Dysphagia EXAM: ESOPHOGRAM / BARIUM SWALLOW / BARIUM TABLET STUDY TECHNIQUE: Combined double contrast and single contrast examination performed using effervescent crystals, thick barium  liquid, and thin barium liquid. The patient was observed with fluoroscopy swallowing a 13 mm barium sulphate tablet. FLUOROSCOPY TIME:  Fluoroscopy Time:  0.9 minutes Radiation Exposure Index (if provided by the fluoroscopic device): 11.5 mGy Number of Acquired Spot Images: 0 COMPARISON:  None. FINDINGS: There was normal pharyngeal anatomy and motility. Contrast flowed freely through the esophagus without evidence of stricture or mass. There was normal esophageal mucosa without evidence of irregularity or ulceration. Tertiary contractions of the distal third of the esophagus as can be seen with esophageal spasm. No evidence of reflux. No definite hiatal hernia was demonstrated. At  the end of the examination a 13 mm barium tablet was administered which transited through the esophagus and esophagogastric junction without delay. IMPRESSION: 1. Tertiary contractions of the distal third of the esophagus as can be seen with esophageal spasm. 2. No esophageal stricture. Electronically Signed   By: Kathreen Devoid   On: 12/19/2016 09:00   US Renal  Result Date: 12/19/2016 CLINICAL DATA:  Acute renal failure EXAM: RENAL / URINARY TRACT ULTRASOUND COMPLETE COMPARISON:  06/20/2016 CT abdomen/pelvis. FINDINGS: Right Kidney: Length: 10.3 cm. Echogenicity within normal limits. No mass or hydronephrosis visualized. Incidental diffuse hepatic steatosis in the visualized right liver lobe. Left Kidney: Length: 10.8 cm. Echogenicity within normal limits. No mass or hydronephrosis visualized. Bladder: Appears normal for degree of bladder distention. Bilateral ureteral jets are demonstrated in the bladder. IMPRESSION: 1. Normal ultrasound of the kidneys and bladder, with no hydronephrosis. 2. Incidental diffuse hepatic steatosis. Electronically Signed   By: Ilona Sorrel M.D.   On: 12/19/2016 10:14     Medications:   . sodium chloride 75 mL/hr at 12/20/16 0837   . atorvastatin  80 mg Oral q1800  . bosutinib  200 mg Oral QHS  . fenofibrate  160 mg Oral Daily  . insulin aspart  0-5 Units Subcutaneous QHS  . insulin aspart  0-9 Units Subcutaneous TID WC  . insulin glargine  25 Units Subcutaneous QHS  . isosorbide mononitrate  30 mg Oral Daily  . levothyroxine  25 mcg Oral QAC breakfast  . magnesium oxide  400 mg Oral BID  . multivitamin with minerals   Oral Daily  . pantoprazole  40 mg Oral Daily  . pregabalin  50 mg Oral TID  . ticagrelor  90 mg Oral BID  . warfarin  3 mg Oral q1800  . Warfarin - Pharmacist Dosing Inpatient   Does not apply q1800   acetaminophen, albuterol, docusate sodium, famotidine, loperamide  Assessment/ Plan:  83 y.o. AA male with medical problems of diabetes type 2  for 4-5 years, now insulin-dependent, BPH, CAD with coronary stents placed in July 2018, CML,GERD, Gout, HTN, Hypothyroidism, who was admitted to [facility] on 12/18/2016 .   1. Acute renal failure on chronic kidney disease stage III Baseline creatinine 1.6 on July 2018 Presenting creatinine 2.40, improved to 1.96 today Renal ultrasound is unremarkable Urinalysis shows glucosuria suggesting underlying diabetic nephropathy Restart VASOTEC  2. Diabetes type 2 with CK D Blood sugars are poorly controlled No recent hemoglobin A1c, results are pending protein to creatinine ratio 0.2  Will follow    LOS: 2 [facility] 10/5/201811:23 AM  [place], [place]

## 2016-12-20 NOTE — Care Management Important Message (Deleted)
Important Message  Patient Details  Name: Lucas Caldwell MRN: [id number] Date of Birth: 1933-11-26   Medicare Important Message Given:  Yes Signed IM notice given    Katrina Stack, RN 12/20/2016, 9:21 AM

## 2016-12-20 NOTE — Progress Notes (Addendum)
EGD post procedure note, no sedation given  Findings - Normal duodenal bulb and second portion of the duodenum.  - Erythematous mucosa in the stomach.  - Non-bleeding erosive gastropathy.  - Low-grade of narrowing and moderate Schatzki ring, explains pt's dysphagia  - Normal upper third of esophagus, middle third of esophagus and gastroesophageal junction.   Recs: - Return patient to hospital ward for ongoing care.  - Chopped diet and mechanical soft diet today.  - Continue present medications.  - Start prilosec 20mg  daily before breakfast - Refer to GI as out pt if pt wants dilation of schatzki's ring, he will need to be off anticoagulation for 3-5 days prior to EGD  [name], MD [place]  [place], [place] [address]  Main: [phone]  Fax: [phone] Pager: [phone]

## 2016-12-20 NOTE — Care Management (Signed)
Presents from home and admitted for acute renal failure with stage 3 chronic kidney disease. Prior to this admission, Independent in all adls, denies issues accessing medical care, obtaining medications or with transportation.  Current with  PCP.  Renal status is improving

## 2016-12-20 NOTE — Progress Notes (Signed)
Progress Note  Patient Name: Lucas Caldwell Date of Encounter: 12/20/2016  Primary Cardiologist: End  Subjective   No complaints this morning, shortness of breath improving Reports having previous episode of dysphagia when eating chicken Received one unit iron infusion  metoprolol and digoxin held for bradycardia Telemetry reviewed, heart rate around 60   Inpatient Medications    Scheduled Meds: . butamben-tetracaine-benzocaine      . lidocaine      . [MAR Hold] atorvastatin  80 mg Oral q1800  . [MAR Hold] bosutinib  200 mg Oral QHS  . [MAR Hold] enalapril  5 mg Oral QPM  . [MAR Hold] fenofibrate  160 mg Oral Daily  . [MAR Hold] insulin aspart  0-5 Units Subcutaneous QHS  . [MAR Hold] insulin aspart  0-9 Units Subcutaneous TID WC  . [MAR Hold] insulin glargine  25 Units Subcutaneous QHS  . [MAR Hold] isosorbide mononitrate  30 mg Oral Daily  . [MAR Hold] levothyroxine  25 mcg Oral QAC breakfast  . [MAR Hold] magnesium oxide  400 mg Oral BID  . [MAR Hold] multivitamin with minerals   Oral Daily  . [MAR Hold] pantoprazole  40 mg Oral Daily  . [MAR Hold] pregabalin  50 mg Oral TID  . [MAR Hold] ticagrelor  90 mg Oral BID  . [MAR Hold] warfarin  3 mg Oral q1800  . [MAR Hold] Warfarin - Pharmacist Dosing Inpatient   Does not apply q1800   Continuous Infusions: . sodium chloride 75 mL/hr at 12/20/16 0837   PRN Meds: [MAR Hold] acetaminophen, [MAR Hold] albuterol, butamben-tetracaine-benzocaine, [MAR Hold] docusate sodium, [MAR Hold] famotidine, [MAR Hold] loperamide   Vital Signs    Vitals:   12/20/16 0834 12/20/16 1200 12/20/16 1218 12/20/16 1220  BP: 119/60  (!) 134/51 (!) 134/49  Pulse: (!) 56 (!) 55 (!) 58 (!) 56  Resp: 16 16    Temp: 97.9 F (36.6 C)     TempSrc: Oral     SpO2: 98% 100% 100% 100%  Weight:      Height:        Intake/Output Summary (Last 24 hours) at 12/20/16 1223 Last data filed at 12/20/16 0855  Gross per 24 hour  Intake               888 ml  Output              725 ml  Net              163 ml   Filed Weights   12/18/16 1351 12/18/16 2117  Weight: 196 lb (88.9 kg) 196 lb 1.6 oz (89 kg)    Telemetry    NSR - Personally Reviewed  ECG      Physical Exam   GEN: No acute distress.   Neck: No JVD Cardiac: RRR, no murmurs, rubs, or gallops.  Respiratory: Clear to auscultation bilaterally. GI: Soft, nontender, non-distended  MS: No edema; No deformity. Neuro:  Nonfocal  Psych: Normal affect   Labs    Chemistry Recent Labs Lab 12/18/16 1456 12/19/16 0009 12/20/16 0430  NA 133* 137 136  K 4.5 4.2 4.2  CL 101 105 106  CO2 25 24 23   GLUCOSE 359* 239* 231*  BUN 35* 31* 25*  CREATININE 2.40* 2.06* 1.96*  CALCIUM 8.9 9.0 8.9  GFRNONAA 24* 29* 30*  GFRAA 28* 33* 35*  ANIONGAP 7 8 7      Hematology Recent Labs Lab 12/18/16 1456 12/19/16 0009 12/20/16  0430  WBC 8.3 8.2 7.1  RBC 3.25* 3.34* 3.31*  HGB 8.3* 8.7* 8.3*  HCT 25.1* 25.5* 25.3*  MCV 77.3* 76.4* 76.5*  MCH 25.6* 26.1 25.2*  MCHC 33.0 34.1 32.9  RDW 16.7* 16.9* 16.7*  PLT 227 236 222    Cardiac Enzymes Recent Labs Lab 12/18/16 1456 12/18/16 1835  TROPONINI <0.03 <0.03   No results for input(s): TROPIPOC in the last 168 hours.   BNP Recent Labs Lab 12/18/16 1456  BNP 33.0     DDimer No results for input(s): DDIMER in the last 168 hours.   Radiology    Dg Chest 2 View  Result Date: 12/18/2016 CLINICAL DATA:  Increasing shortness of breath over the last 4 months. History of coronary artery disease with stent placement, chronic kidney disease and hypertension. EXAM: CHEST  2 VIEW COMPARISON:  09/19/2016 and 08/16/2016. FINDINGS: The heart size and mediastinal contours are stable. There is atherosclerosis of the aorta. A coronary stent is noted on the lateral view. Stable borderline interstitial coarsening on the frontal examination without corresponding finding on the lateral view. There is no edema, confluent airspace  opacity or pleural effusion. The bones appear unchanged. IMPRESSION: Stable radiographic appearance of the chest. No acute cardiopulmonary process. Electronically Signed   By: Richardean Sale M.D.   On: 12/18/2016 14:39   Dg Esophagus  Result Date: 12/19/2016 CLINICAL DATA:  Dysphagia EXAM: ESOPHOGRAM / BARIUM SWALLOW / BARIUM TABLET STUDY TECHNIQUE: Combined double contrast and single contrast examination performed using effervescent crystals, thick barium liquid, and thin barium liquid. The patient was observed with fluoroscopy swallowing a 13 mm barium sulphate tablet. FLUOROSCOPY TIME:  Fluoroscopy Time:  0.9 minutes Radiation Exposure Index (if provided by the fluoroscopic device): 11.5 mGy Number of Acquired Spot Images: 0 COMPARISON:  None. FINDINGS: There was normal pharyngeal anatomy and motility. Contrast flowed freely through the esophagus without evidence of stricture or mass. There was normal esophageal mucosa without evidence of irregularity or ulceration. Tertiary contractions of the distal third of the esophagus as can be seen with esophageal spasm. No evidence of reflux. No definite hiatal hernia was demonstrated. At the end of the examination a 13 mm barium tablet was administered which transited through the esophagus and esophagogastric junction without delay. IMPRESSION: 1. Tertiary contractions of the distal third of the esophagus as can be seen with esophageal spasm. 2. No esophageal stricture. Electronically Signed   By: Kathreen Devoid   On: 12/19/2016 09:00   US Renal  Result Date: 12/19/2016 CLINICAL DATA:  Acute renal failure EXAM: RENAL / URINARY TRACT ULTRASOUND COMPLETE COMPARISON:  06/20/2016 CT abdomen/pelvis. FINDINGS: Right Kidney: Length: 10.3 cm. Echogenicity within normal limits. No mass or hydronephrosis visualized. Incidental diffuse hepatic steatosis in the visualized right liver lobe. Left Kidney: Length: 10.8 cm. Echogenicity within normal limits. No mass or  hydronephrosis visualized. Bladder: Appears normal for degree of bladder distention. Bilateral ureteral jets are demonstrated in the bladder. IMPRESSION: 1. Normal ultrasound of the kidneys and bladder, with no hydronephrosis. 2. Incidental diffuse hepatic steatosis. Electronically Signed   By: Ilona Sorrel M.D.   On: 12/19/2016 10:14    Cardiac Studies     Patient Profile     83 y.o. male  With a h/o coronary artery disease, mixed ischemic and nonischemic cardiomyopathy, hypertension, hyperlipidemia, type 2 diabetes mellitus, paroxysmal atrial fibrillation on chronic Coumadin, stage III chronic kidney disease, CML, iron deficiency anemia, BPH, and hypothyroidism who presented to [facility] with 1-2 months of dysphagia  with associated fatigue and increased SOB.   Assessment & Plan     1. Fatigue/SOB: Would continue to hold metoprolol and digoxin given bradycardia No ischemia workup at this time  2. CAD: Chest discomfort from dysphagia does not appear to be a primary cardiac event -Continue medical therapy for known CAD, no plans for inpatient high-risk PCT of the left main at this time as these episodes appear to be related to esophageal spasms  -Continue Brilinta with Coumadin (not on ASA currently given need for Coumadin with his Afib) -Restart Imdur (not continued at admission) Hold beta blocker and digoxin  3. PAF: -Remaining in sinus rhythm with mildly bradycardic rates in the 50s bpm -Hold digoxin -Metoprolol on hold -Coumadin per pharmacy   4. Acute on CKD stage III: Followed by nephrology  5. Bradycardia: Stable, metoprolol and digoxin held  6. Hypothyroidism: -TSH normal  7. Acute on anemia of chronic disease/CML: Received iron infusion, seen by Dr. [name]  8. Dysphagia: EGD this morning   Total encounter time more than 25 minutes  Greater than 50% was spent in counseling and coordination of care with the patient    For questions or updates, please  contact [place] Please consult [email or web address] for contact info under Cardiology/STEMI.      Signed, Ida Rogue, MD  12/20/2016, 12:23 PM

## 2016-12-20 NOTE — Telephone Encounter (Signed)
TCM ph [facility] for SOB Bradycardia  Needs 2 weeks fu scheduled with Thurmond Butts 10/24 at 3  Added to waitlist

## 2016-12-20 NOTE — Telephone Encounter (Signed)
Currently admitted.

## 2016-12-20 NOTE — Op Note (Addendum)
[facility] Gastroenterology Patient Name: Lucas Caldwell Procedure Date: 12/20/2016 12:13 PM MRN: [id number] Account #: [id number] Date of Birth: [birth year] Admit Type: Inpatient Age: 83 Room: [facility] ENDO ROOM 4 Gender: Male Note Status: Finalized Procedure:            Upper GI endoscopy Indications:          Esophageal dysphagia Providers:            Lin Landsman MD, MD Referring MD:         No Local Md, MD (Referring MD) Medicines:            None Complications:        No immediate complications. Estimated blood loss: None. Procedure:            Pre-Anesthesia Assessment:                       - Prior to the procedure, a History and Physical was                        performed, and patient medications and allergies were                        reviewed. The patient is competent. The risks and                        benefits of the procedure and the sedation options and                        risks were discussed with the patient. All questions                        were answered and informed consent was obtained.                        Patient identification and proposed procedure were                        verified by the physician, the nurse and the technician                        in the pre-procedure area in the procedure room. Mental                        Status Examination: alert and oriented. Airway                        Examination: normal oropharyngeal airway and neck                        mobility. Respiratory Examination: clear to                        auscultation. CV Examination: normal. Prophylactic                        Antibiotics: The patient does not require prophylactic                        antibiotics. Prior Anticoagulants: The patient has  taken Brilinta and Coumadin (warfarin), last dose was                        day of procedure. ASA Grade Assessment: IV - A patient                        with severe  systemic disease that is a constant threat                        to life. After reviewing the risks and benefits, the                        patient was deemed in satisfactory condition to undergo                        the procedure. The anesthesia plan was to use no                        sedation or anesthesia. Immediately prior to                        administration of medications, the patient was                        re-assessed for adequacy to receive sedatives. The                        heart rate, respiratory rate, oxygen saturations, blood                        pressure, adequacy of pulmonary ventilation, and                        response to care were monitored throughout the                        procedure. The physical status of the patient was                        re-assessed after the procedure.                       After obtaining informed consent, the endoscope was                        passed under direct vision. Throughout the procedure,                        the patient's blood pressure, pulse, and oxygen                        saturations were monitored continuously. The Endoscope                        was introduced through the mouth, and advanced to the                        second part of duodenum. The upper GI endoscopy was  accomplished without difficulty. The patient tolerated                        the procedure fairly well. Findings:      The duodenal bulb and second portion of the duodenum were normal.      Diffuse mildly erythematous mucosa without bleeding was found in the       entire examined stomach.      A single localized, diminutive non-bleeding erosion was found at the       incisura. There were stigmata of recent bleeding.      A low-grade of narrowing and moderate Schatzki ring (acquired) was found       in the lower third of the esophagus, this explains patient's dysphagia,       dilation was not performed as  pt is on both oumadin and brilinta.      The upper third of the esophagus, middle third and lower third of the       esophagus and gastroesophageal junction were otherwise normal. No       evidence of esophageal cancer Impression:           - Normal duodenal bulb and second portion of the                        duodenum.                       - Erythematous mucosa in the stomach.                       - Non-bleeding erosive gastropathy.                       - Low-grade of narrowing and moderate Schatzki ring,                        explains pt's dysphagia                       - Normal upper third of esophagus, middle third of                        esophagus and gastroesophageal junction.                       - No specimens collected. Recommendation:       - Return patient to hospital ward for ongoing care.                       - Chopped diet and mechanical soft diet today.                       - Continue present medications.                       - Refer to GI as out pt if pt wants dilation of                        schatzki's ring, he will need to be off anticoagulation                        for 3-5 days prior to  EGD Procedure Code(s):    --- Professional ---                       [phone], Esophagogastroduodenoscopy, flexible, transoral;                        diagnostic, including collection of specimen(s) by                        brushing or washing, when performed (separate procedure) Diagnosis Code(s):    --- Professional ---                       K31.89, Other diseases of stomach and duodenum                       K22.2, Esophageal obstruction                       R13.14, Dysphagia, pharyngoesophageal phase CPT copyright 2016 American Medical Association. All rights reserved. The codes documented in this report are preliminary and upon coder review may  be revised to meet current compliance requirements. Dr. Ulyess Mort Lin Landsman MD, MD 12/20/2016 12:46:42  PM This report has been signed electronically. Number of Addenda: 0 Note Initiated On: 12/20/2016 12:13 PM      [facility]

## 2016-12-20 NOTE — Progress Notes (Signed)
Inpatient Diabetes Program Recommendations  AACE/ADA: New Consensus Statement on Inpatient Glycemic Control (2015)  Target Ranges:  Prepandial:   less than 140 mg/dL      Peak postprandial:   less than 180 mg/dL (1-2 hours)      Critically ill patients:  140 - 180 mg/dL   Results for DOC, MANDALA (MRN [id number]) as of 12/20/2016 09:51  Ref. Range 12/19/2016 07:38 12/19/2016 11:56 12/19/2016 16:42 12/19/2016 21:10 12/20/2016 07:52  Glucose-Capillary Latest Ref Range: 65 - 99 mg/dL 222 (H) 261 (H) 278 (H) 254 (H) 191 (H)   Review of Glycemic Control  Diabetes history: DM2 Outpatient Diabetes medications: Lantus 65 units QHS Current orders for Inpatient glycemic control: Lantus 25 units QHS, Novolog 0-9 units TID with meals, Novolog 0-5 units QHS  Inpatient Diabetes Program Recommendations: Insulin - Basal: Please consider increasing Lantus to 27 units QHS.  Insulin-Meal Coverage: Once diet is resumed and if post prandial glucose remains consistently elevated, please consider ordering Novolog 3 units TID with meals for meal coverage if patient eats at least 50% of meals.  Thanks, Barnie Alderman, RN, MSN, CDE Diabetes Coordinator Inpatient Diabetes Program [phone] (Team Pager from 8am to 5pm)

## 2016-12-23 ENCOUNTER — Encounter: Payer: Self-pay | Admitting: Gastroenterology

## 2016-12-23 NOTE — Discharge Summary (Signed)
[facility] NAME: Lucas Caldwell    MR#:  [id number]  DATE OF BIRTH:  August 17, 1933  DATE OF ADMISSION:  12/18/2016   ADMITTING PHYSICIAN: Vaughan Basta, MD  DATE OF DISCHARGE: 12/20/2016  4:32 PM  PRIMARY CARE PHYSICIAN: Juanell Fairly, MD   ADMISSION DIAGNOSIS:  Dehydration [E86.0] Dyspnea on exertion [R06.09] Dysphagia [R13.10] Symptomatic anemia [D64.9] DISCHARGE DIAGNOSIS:  Principal Problem:   Acute renal failure (ARF) (HCC) Active Problems:   Fatigue  SECONDARY DIAGNOSIS:   Past Medical History:  Diagnosis Date  . Arthritis    "probably in his legs" (08/15/2016)  . Asthma   . Blind right eye   . BPH (benign prostatic hyperplasia)   . Breast asymmetry    Left breast is larger, present for several years.  [name] CAD (coronary artery disease)    a. Cath in the late 90's - reportedly ok;  b. 2014 s/p stenting x 2 @ [facility]; c 08/19/16 Cath/PCI with DES -> RCA, plan to treat LM 70% medically. Seen by surgery and felt to be too high risk for CABG.  . Chronic combined systolic (congestive) and diastolic (congestive) heart failure ([place])    a. Previously reduced EF-->50% by echo in 2012;  b. 06/2015 Echo: EF 50-55%  c. 07/2016 Echo: EF 45-50%.  . CKD (chronic kidney disease), stage III ([place])   . CML (chronic myelocytic leukemia) ([place])   . GERD (gastroesophageal reflux disease)   . Gout   . Hyperlipemia   . Hypertension   . Hypothyroidism   . Iron deficiency anemia   . Migraine    "in the 1960s" (08/15/2016)  . NICM (nonischemic cardiomyopathy) ([place])    a. Previously reduced EF-->50% by echo in 2012;  b. 06/2015 Echo: EF 50-55%, Gr2 DD, mild MR, mildly dil LA, Ao sclerosis, mild TR;  c. 07/2016 Echo: EF 45-50%, ? inf HK, mild MR, mildly dil LA.  [name] PAF (paroxysmal atrial fibrillation) (HCC)    a. ? Dx 2014-->s/p DCCV;  b. CHA2DS2VASc = 6-->chronic coumadin.  [name] RBBB   . Type II diabetes mellitus [facility])    HOSPITAL COURSE:  83  y.o. male with a k/h/o coronary artery disease, mixed ischemic and nonischemic cardiomyopathy, hypertension, hyperlipidemia, type 2 diabetes mellitus, paroxysmal atrial fibrillation on chronic Coumadin, stage III chronic kidney disease, CML, iron deficiency anemia, BPH, and hypothyroidism admitted to [facility] with 1-2 months of dysphagia. Also have c/o generalized weakness, fatigue and SOB with minimal exertion.   * Acute renal failure on chronic kidney disease stage III - Baseline creatinine 1.6 on July 2018 - Presenting creatinine 2.40, improved to 1.96 on the day of discharge - Renal ultrasound is unremarkable - Urinalysis shows glucosuria suggesting underlying diabetic nephropathy - Restarted VASOTEC on D/C  * Anemia of chronic kidney disease:  - Also has history of CML -Outpt f/up at [facility] center No need for transfusion, but as have Hx of CAD and stent- may transfuse <8.  * Dysphagia -Barium swallow showed esophageal spasm in the lower part. -EGD on 10/5 showed Normal duodenal bulb and second portion of the duodenum. Erythematous mucosa in the stomach. Non-bleeding erosive gastropathy.  - Low-grade of narrowing and moderate Schatzki ring, explains pt's dysphagia. Normal upper third of esophagus, middle third of esophagus and gastroesophageal junction.  - patient was recommended Chopped diet and mechanical soft diet for next 2-3 days and then advance as tolerated - Started PPI before breakfast on discharge - outpt  GI follow up if pt wants dilation of schatzki's ring, he will need to be off anticoagulation for 3-5 days prior to EGD  * CAD - stopping digoxin. Consider stopping metoprolol as an outpt if he remains bradycardic and symptomatic. - No ischemia workup at this time  * Bradycardia - stopping digoxin. Consider stopping metoprolol as an outpt if he remains bradycardic and symptomatic.  * PAF: -Remaining in sinus rhythm with mildly bradycardic rates in the 50s  bpm -stop digoxin -continue Coumadin and brilanta - consider stopping metoprolol as an outpt if he remains bradycardic and symptomatic. DISCHARGE CONDITIONS:  stable CONSULTS OBTAINED:  Treatment Team:  Wellington Hampshire, MD Lloyd Huger, MD DRUG ALLERGIES:   Allergies  Allergen Reactions  . Clopidogrel Anaphylaxis    Other reaction(s): ANAPHYLAXIS;altered mental status;  (electrolytes "out of wack" and confusion per family; THEY DO NOT REMEMBER ANY OTHER REACTION)- MSB 10/10/15  . Ciprofloxacin Itching  . Spironolactone     Other reaction(s): Other (See Comments) gynecomastia  . Plavix [Clopidogrel Bisulfate] Anxiety   DISCHARGE MEDICATIONS:   Allergies as of 12/20/2016      Reactions   Clopidogrel Anaphylaxis   Other reaction(s): ANAPHYLAXIS;altered mental status;  (electrolytes "out of wack" and confusion per family; THEY DO NOT REMEMBER ANY OTHER REACTION)- MSB 10/10/15   Ciprofloxacin Itching   Spironolactone    Other reaction(s): Other (See Comments) gynecomastia   Plavix [clopidogrel Bisulfate] Anxiety      Medication List    STOP taking these medications   digoxin 0.125 MG tablet Commonly known as:  LANOXIN     TAKE these medications   acetaminophen 500 MG tablet Commonly known as:  TYLENOL Take 500 mg by mouth every 6 (six) hours as needed.   albuterol 108 (90 Base) MCG/ACT inhaler Commonly known as:  PROVENTIL HFA;VENTOLIN HFA Inhale 2 puffs into the lungs every 6 (six) hours as needed for wheezing or shortness of breath.   allopurinol 300 MG tablet Commonly known as:  ZYLOPRIM Take 300 mg by mouth daily.   atorvastatin 80 MG tablet Commonly known as:  LIPITOR Take 1 tablet (80 mg total) by mouth daily at 6 PM.   bosutinib 100 MG tablet Commonly known as:  BOSULIF Take 200 mg by mouth at bedtime. Take with food.   enalapril 5 MG tablet Commonly known as:  VASOTEC Take 5 mg by mouth daily.   famotidine 20 MG tablet Commonly known as:   PEPCID Take 20 mg by mouth at bedtime as needed for heartburn. Take 2 hours after chemo pill.   fenofibrate 160 MG tablet Take 1 tablet (160 mg total) by mouth daily.   furosemide 20 MG tablet Commonly known as:  LASIX Take 20 mg by mouth daily.   hydrocortisone 2.5 % ointment Apply topically 3 (three) times daily.   isosorbide mononitrate 30 MG 24 hr tablet Commonly known as:  IMDUR Take 1 tablet (30 mg total) by mouth daily.   LANTUS SOLOSTAR 100 UNIT/ML Solostar Pen Generic drug:  Insulin Glargine Inject 20 Units into the skin at bedtime. What changed:  how much to take   levothyroxine 25 MCG tablet Commonly known as:  SYNTHROID, LEVOTHROID Take 25 mcg by mouth daily before breakfast.   loperamide 2 MG tablet Commonly known as:  IMODIUM A-D Take 2 mg by mouth 3 (three) times daily as needed for diarrhea or loose stools.   magnesium oxide 400 MG tablet Commonly known as:  MAG-OX Take 400 mg by  mouth 2 (two) times daily.   metoprolol succinate 50 MG 24 hr tablet Commonly known as:  TOPROL-XL Take 50 mg by mouth every morning.   MULTIVITAMIN ADULTS 50+ PO Take 1 tablet by mouth every morning.   nepafenac 0.1 % ophthalmic suspension Commonly known as:  [place] 1 drop into both eyes 3 (three) times daily as needed.   nitroGLYCERIN 0.4 MG SL tablet Commonly known as:  NITROSTAT Place 1 tablet (0.4 mg total) under the tongue every 5 (five) minutes x 3 doses as needed for chest pain.   OPTICHAMBER ADVANTAGE Misc 1 each by Other route once.   pantoprazole 40 MG tablet Commonly known as:  PROTONIX Take 1 tablet (40 mg total) by mouth daily.   pregabalin 50 MG capsule Commonly known as:  LYRICA Take 50 mg by mouth 3 (three) times daily.   ranitidine 300 MG tablet Commonly known as:  ZANTAC Take 300 mg by mouth daily.   ticagrelor 90 MG Tabs tablet Commonly known as:  BRILINTA Take 1 tablet (90 mg total) by mouth 2 (two) times daily.   warfarin 2 MG  tablet Commonly known as:  COUMADIN Take 3 mg by mouth at bedtime.        DISCHARGE INSTRUCTIONS:   DIET:  Cardiac diet DISCHARGE CONDITION:  Stable ACTIVITY:  Activity as tolerated OXYGEN:  Home Oxygen: No.  Oxygen Delivery: room air DISCHARGE LOCATION:  home   If you experience worsening of your admission symptoms, develop shortness of breath, life threatening emergency, suicidal or homicidal thoughts you must seek medical attention immediately by calling 911 or calling your MD immediately  if symptoms less severe.  You Must read complete instructions/literature along with all the possible adverse reactions/side effects for all the Medicines you take and that have been prescribed to you. Take any new Medicines after you have completely understood and accpet all the possible adverse reactions/side effects.   Please note  You were cared for by a hospitalist during your hospital stay. If you have any questions about your discharge medications or the care you received while you were in the hospital after you are discharged, you can call the unit and asked to speak with the hospitalist on call if the hospitalist that took care of you is not available. Once you are discharged, your primary care physician will handle any further medical issues. Please note that NO REFILLS for any discharge medications will be authorized once you are discharged, as it is imperative that you return to your primary care physician (or establish a relationship with a primary care physician if you do not have one) for your aftercare needs so that they can reassess your need for medications and monitor your lab values.    On the day of Discharge:  VITAL SIGNS:  Blood pressure (!) 143/45, pulse (!) 53, temperature 97.8 F (36.6 C), temperature source Oral, resp. rate 16, height 5\' 6"  (1.676 m), weight 89 kg (196 lb 1.6 oz), SpO2 100 %. PHYSICAL EXAMINATION:  GENERAL:  83 y.o.-year-old patient lying in the bed  with no acute distress.  EYES: Pupils equal, round, reactive to light and accommodation. No scleral icterus. Extraocular muscles intact.  HEENT: Head atraumatic, normocephalic. Oropharynx and nasopharynx clear.  NECK:  Supple, no jugular venous distention. No thyroid enlargement, no tenderness.  LUNGS: Normal breath sounds bilaterally, no wheezing, rales,rhonchi or crepitation. No use of accessory muscles of respiration.  CARDIOVASCULAR: S1, S2 normal. No murmurs, rubs, or gallops.  ABDOMEN: Soft,  non-tender, non-distended. Bowel sounds present. No organomegaly or mass.  EXTREMITIES: No pedal edema, cyanosis, or clubbing.  NEUROLOGIC: Cranial nerves II through XII are intact. Muscle strength 5/5 in all extremities. Sensation intact. Gait not checked.  PSYCHIATRIC: The patient is alert and oriented x 3.  SKIN: No obvious rash, lesion, or ulcer.  DATA REVIEW:   CBC  Recent Labs Lab 12/20/16 0430  WBC 7.1  HGB 8.3*  HCT 25.3*  PLT 222    Chemistries   Recent Labs Lab 12/20/16 0430  NA 136  K 4.2  CL 106  CO2 23  GLUCOSE 231*  BUN 25*  CREATININE 1.96*  CALCIUM 8.9    Follow-up Information    Juanell Fairly, MD. Go on 12/24/2016.   Specialty:  Pediatrics Why:  @11 :00 AM Contact information: [place] [address] [phone]        Lin Landsman, MD. Go on 01/07/2017.   Specialty:  Gastroenterology Why:  @1 :00 PM Contact information: [place] [address] [phone]        Rise Mu, PA-C. Go on 01/08/2017.   Specialties:  Physician Assistant, Cardiology, Radiology Why:  @3 :00 PM Contact information: [place] [address] [phone]           Management plans discussed with the patient, family and they are in agreement.  CODE STATUS: Prior   TOTAL TIME TAKING CARE OF THIS PATIENT: 45 minutes.    Max Sane M.D on 12/23/2016 at 12:57  PM  Between 7am to 6pm - Pager - [phone]  After 6pm go to [email or web address] - [employer] [facility]  Office  [phone]  CC: Primary care physician; Juanell Fairly, MD   Note: This dictation was prepared with Dragon dictation along with smaller phrase technology. Any transcriptional errors that result from this process are unintentional.

## 2016-12-23 NOTE — Telephone Encounter (Signed)
Patient contacted regarding discharge from [facility] on October 5. He asks I review information with his wife.   Patient understands to follow up with provider [name], PA-C on October 24 at 3pm at [facility]. Patient understands discharge instructions? yes Patient understands medications and regiment? yes Patient understands to bring all medications to this visit? yes  Reviewed with wife importance of weighing pt daily and report weight gain >2lbs/day or 5lbs week. Wife will monitor weight and breathing and report if sx worsen.

## 2016-12-25 ENCOUNTER — Ambulatory Visit (INDEPENDENT_AMBULATORY_CARE_PROVIDER_SITE_OTHER): Payer: Medicare HMO

## 2016-12-25 ENCOUNTER — Telehealth: Payer: Self-pay | Admitting: Internal Medicine

## 2016-12-25 DIAGNOSIS — I480 Paroxysmal atrial fibrillation: Secondary | ICD-10-CM

## 2016-12-25 DIAGNOSIS — Z7901 Long term (current) use of anticoagulants: Secondary | ICD-10-CM

## 2016-12-25 LAB — POCT INR: INR: 2.8

## 2016-12-25 NOTE — Telephone Encounter (Signed)
Attempted to schedule sooner tcm patient not available

## 2016-12-27 NOTE — Unmapped (Deleted)
[facility]  ADULT RETURN VISIT      Assessment & Plan:       PLAN:    Subjective:     History of Present Illness:  Ivan Pierce is a 83 y.o. male with history of    Review of Systems:  12 point review of systems is negative is except as noted in the HPI    Past Medical History:  Past Medical History:   Diagnosis Date   ??? Asthma    ??? Benign prostatic hypertrophy    ??? CHF (congestive heart failure) (CMS-HCC)    ??? CML (chronic myelocytic leukemia) (CMS-HCC)     Detection of BCR-ABL1 p210 RNA suggests the presence of cells containing t(9;22), consistent with a diagnosis of leukemia.  Oral chemotherapeutic agent started Sept 2014.   ??? Coronary artery disease    ??? Gout    ??? Hypercholesterolemia    ??? Hypertension    ??? Hypothyroidism    ??? Insulin dependent diabetes mellitus (CMS-HCC)    ??? Iron deficiency anemia    ??? Leukocytosis     WBC 37.0 July 2014 @ outside lab.  Per the smear review, There is a neutrophilic leukocytosis with increased myelocytes and basophils consistent with CML. I reviewed the patient's test results from 03/16/12 which indicate the karyotypic phenotype ([place] translocation) typically seen in CML.  The patient has CML and appropriate follow up is indicated.    ??? Medicare annual wellness visit, initial 09/20/2014   ??? Paroxysmal atrial fibrillation (CMS-HCC)        Medications:  Current Outpatient Prescriptions   Medication Sig Dispense Refill   ??? acetaminophen (TYLENOL) 500 MG tablet Take 500 mg by mouth daily as needed for pain (In the morning for pain in feet).     ??? atorvastatin (LIPITOR) 80 MG tablet Take 1 tablet (80 mg total) by mouth daily. 90 tablet 3   ??? blood sugar diagnostic (TRUETRACK TEST) Strp Use three times daily to monitor blood sugar. (Dx: IDDM, ICD-9: 250.01) 100 each 12   ??? BOSULIF 100 mg Tab TAKE 2 TABLETS (200MG ) BY MOUTH ONCE DAILY 60 tablet PRN   ??? digoxin (LANOXIN) 125 mcg tablet Take 1 tablet (125 mcg total) by mouth daily. 30 tablet 12   ??? enalapril (VASOTEC) 5 MG tablet Take 1 tablet (5 mg total) by mouth daily. 60 tablet 11   ??? famotidine (PEPCID) 20 MG tablet Take 1 tablet (20 mg total) by mouth nightly as needed for heartburn. Take 2 hours after chemo pill. 180 tablet 3   ??? fenofibrate (LOFIBRA) 160 MG tablet Take 1 tablet (160 mg total) by mouth daily. 90 tablet 3   ??? furosemide (LASIX) 20 MG tablet Take 20 mg by mouth daily.     ??? GLYCINE CARBONATE SODIUM (EFFERVESCENT MISC) Denture tabs     ??? IMODIUM A-D 2 mg capsule Take 2 mg by mouth 4 (four) times a day as needed.      ??? insulin glargine (LANTUS SOLOSTAR U-100 INSULIN) 100 unit/mL (3 mL) injection pen Inject 15 units in AM and 50 units in the evening 45 mL 3   ??? isosorbide mononitrate (IMDUR) 30 MG 24 hr tablet TAKE ONE TABLET (30MG ) BY MOUTH EVERY DAY 30 tablet 1   ??? lancets Misc Use TID to monitor blood sugar. (IDDM 250.01) 100 each 12   ??? levothyroxine (SYNTHROID, LEVOTHROID) 50 MCG tablet Take 0.5 tablets (25 mcg total) by mouth every morning. 30  tablet 12   ??? magnesium oxide (MAG-OX) 400 mg tablet Take 1 tablet (400 mg total) by mouth Two (2) times a day. 180 tablet 3   ??? MEDICAL SUPPLY ITEM Pt has DM with neuropathy and callus 2 Units 0   ??? metoprolol succinate (TOPROL-XL) 50 MG 24 hr tablet Take 1 tablet (50 mg total) by mouth daily. To control heart rate. 30 tablet 12   ??? MULTIVIT-MINERALS/FA/LYCOPENE (ONE-A-DAY MEN'S ORAL) Take 1 tablet by mouth daily with breakfast.     ??? nepafenac 0.3 % DrpS Apply to eye.     ??? nitroglycerin (NITROSTAT) 0.4 MG SL tablet Place 0.4 mg under the tongue.     ??? pen needle, diabetic 31 gauge x 5/16 Ndle USE AS DIRECTED WITH INSULIN ADMINISTRATION 100 each 99   ??? pregabalin (LYRICA) 50 MG capsule Take 1 capsule (50 mg total) by mouth Three (3) times a day. 270 capsule 3   ??? ranitidine (ZANTAC) 300 MG tablet Take 1 tablet (300 mg total) by mouth every morning. 30 tablet 3   ??? ticagrelor (BRILINTA) 90 mg Tab Take 1 tablet (90 mg total) by mouth Two (2) times a day. 180 tablet 3   ??? triamcinolone (KENALOG) 0.1 % cream Apply topically Two (2) times a day. 80 g 0   ??? warfarin (COUMADIN) 2 MG tablet Take 2 tablets (4 mg total) by mouth daily. Or as directed by physician. 60 tablet 12     No current facility-administered medications for this visit.        Allergies:  Allergies   Allergen Reactions   ??? Clopidogrel Anaphylaxis     Other reaction(s): ANAPHYLAXIS;altered mental status;   (electrolytes out of wack and confusion per family; THEY DO NOT REMEMBER ANY OTHER REACTION)- MSB 10/10/15   ??? Ciprofloxacin Itching   ??? Spironolactone Other (See Comments)     gynecomastia   ??? Pregabalin Itching       Immunization Hx:  Immunization History   Administered Date(s) Administered   ??? INFLUENZA TIV (TRI) PF (IM) 01/13/2007, 12/13/2008, 01/30/2010, 12/26/2011   ??? Influenza Vaccine Quad (IIV4 PF) 72mo+ injectable 12/25/2012   ??? Influenza, High Dose (IIV3) 65 yrs & older 12/29/2014   ??? PPD Test 09/25/2010   ??? Pneumococcal Conjugate 13-Valent 01/31/2014   ??? Pneumococcal Polysaccharide 23 06/20/2008, 09/02/2010       Objective:     Vitals:  There were no vitals filed for this visit.  Wt Readings from Last 3 Encounters:   11/28/16 90.9 kg (200 lb 6.4 oz)   10/10/16 92.1 kg (203 lb)   09/27/16 90.5 kg (199 lb 9.6 oz)     No height and weight on file for this encounter.    Physical Exam:  General appearance:  HEENT:  Lungs:  CV:  Abdomen:  Extremities:  Psych:  Skin:  Neuro:        Smith Mince MD  Med-Peds, PGY-3  December 27, 2016 1:05 PM

## 2017-01-06 ENCOUNTER — Other Ambulatory Visit: Payer: Self-pay

## 2017-01-06 NOTE — Unmapped (Signed)
Per request of Dr. Cindra Presume, I called the patient today to check in on him to determine his wishes to maintain appointments here 01/09/2017 to f/u with Dr. Vertell Limber versus cancellation and confirmation that he will seek total oncological care locally.    Patient confirmed that he prefers care locally with Dr. Orlie Dakin exclusively due to hardship to travel to [place], [place].  I asked him if he wanted me to cancel upcoming appointments and he agreed this would be the correct decision for him and I reviewed the concept twice. I told him the local oncologist could always communicate with Dr. Vertell Limber should concerns or questions arise in his future care. Patient agreeable to plan of care.    Sent message to scheduler to cancel all upcoming appointments on 01/09/2017.

## 2017-01-06 NOTE — Unmapped (Signed)
-----   Message from Avie Arenas, MD sent at 01/06/2017 12:24 PM EDT -----  Ivan Pierce,    Can you check in with Ivan Pierce? [name] on my schedule for Thursday but I received a message from Dr. Orlie Dakin in [place] that he saw Ivan Pierce in the hospital and per patient preference, he decided to remain under his care locally for his CML. Of course, I have no problem seeing him on Thursday, but would like to cancel his visit ahead of time if he is not expecting to come or remain under our care.     Thanks,    [employer]

## 2017-01-07 ENCOUNTER — Other Ambulatory Visit: Payer: Self-pay

## 2017-01-07 ENCOUNTER — Encounter: Payer: Self-pay | Admitting: Gastroenterology

## 2017-01-07 ENCOUNTER — Ambulatory Visit (INDEPENDENT_AMBULATORY_CARE_PROVIDER_SITE_OTHER): Payer: Medicare HMO | Admitting: Gastroenterology

## 2017-01-07 VITALS — BP 124/80 | HR 78 | Temp 98.3°F | Ht 66.0 in | Wt 204.6 lb

## 2017-01-07 DIAGNOSIS — K222 Esophageal obstruction: Secondary | ICD-10-CM | POA: Diagnosis not present

## 2017-01-07 DIAGNOSIS — R1310 Dysphagia, unspecified: Secondary | ICD-10-CM | POA: Diagnosis not present

## 2017-01-07 DIAGNOSIS — R1319 Other dysphagia: Secondary | ICD-10-CM

## 2017-01-07 NOTE — Progress Notes (Signed)
Patient needs to be seen in our office before clearance can be given. [name] scheduled to see [name], PA on 10/24.  [name], MD [facility] Pager: [phone]

## 2017-01-07 NOTE — Progress Notes (Signed)
[name], MD [address]  [place]  [place], [place] [address]  Main: [phone]  Fax: [phone]    Gastroenterology Consultation  Referring Provider:     Juanell Fairly, MD Primary Care Physician:  Juanell Fairly, MD Primary Gastroenterologist:  Dr. [name] Reason for Consultation:     Dysphagia to solids        HPI:   Lucas Caldwell is a 83 y.o. y/o male referred by Dr. Juanell Fairly, MD  for consultation & management of dysphagia for 3 months.  [name] here for hospital follow-up.  He has multiple comorbidities as listed below, significant for coronary artery disease, congestive heart failure, atrial fibrillation on anticoagulation with brilinta and Coumadin, CML, recent hospitalization on 12/19/2016 with AKI and 3 months history of intermittent dysphagia to solid food. He reports that eating chicken or beef or hard foods which are difficult to chew result in food stuck in upper chest. He denies trouble swallowing liquids or any other foods. Denies losing any weight. Denies abdominal pain, nausea, vomiting, heartburn or regurgitation. He denies having an upper endoscopy or a colonoscopy in the past. He had a barium esophagram today which did not reveal any obvious stricture or a space-occupying lesion or extrinsic compression. There was a possibility of esophageal spasm due to evidence of tertiary contractions of the distal third of the esophagus. [name] also evaluated by speech pathology and there was no evidence of oropharyngeal dysphagia. [name] on Pepcid as outpatient. He denies having any food impaction.  GI was consulted at that time, I performed upper endoscopy which revealed partially obstructing moderate size Schatzki's ring, I did not perform dilation as patient was on both Coumadin and Brilinta.  [name] here for hospital follow-up today.  He reports that since discharge he has been taking ranitidine 300 mg in the morning and Protonix 40 mg before dinner.  He denies  any heartburn but continues to have dysphagia to solid foods especially hard foods like chicken, beef, and ham.  He acknowledged that he tries to eat fast without chewing properly. [name] accompanied by his wife today.  He otherwise denies any complaints today and reports doing well.  [name] not smoking  GI Procedures: EGD 12/20/2016 Findings: The duodenal bulb and second portion of the duodenum were normal. Diffuse mildly erythematous mucosa without bleeding was found in the entire examined stomach. A single localized, diminutive non-bleeding erosion was found at the incisura. There were stigmata of recent bleeding. A low-grade of narrowing and moderate Schatzki ring (acquired) was found in the lower third of the esophagus, this explains patient's dysphagia, dilation was not performed as pt is on both oumadin and brilinta. The upper third of the esophagus, middle third and lower third of the esophagus and gastroesophageal junction were otherwise normal. No evidence of esophageal cancer   Past Medical History:  Diagnosis Date  . Arthritis    "probably in his legs" (08/15/2016)  . Asthma   . Blind right eye   . BPH (benign prostatic hyperplasia)   . Breast asymmetry    Left breast is larger, present for several years.  [name] CAD (coronary artery disease)    a. Cath in the late 90's - reportedly ok;  b. 2014 s/p stenting x 2 @ [facility]; c 08/19/16 Cath/PCI with DES -> RCA, plan to treat LM 70% medically. Seen by surgery and felt to be too high risk for CABG.  . Chronic combined systolic (congestive)  and diastolic (congestive) heart failure ([place])    a. Previously reduced EF-->50% by echo in 2012;  b. 06/2015 Echo: EF 50-55%  c. 07/2016 Echo: EF 45-50%.  . CKD (chronic kidney disease), stage III ([place])   . CML (chronic myelocytic leukemia) ([place])   . GERD (gastroesophageal reflux disease)   . Gout   . Hyperlipemia   . Hypertension   . Hypothyroidism   . Iron deficiency anemia   . Migraine    "in the  1960s" (08/15/2016)  . NICM (nonischemic cardiomyopathy) ([place])    a. Previously reduced EF-->50% by echo in 2012;  b. 06/2015 Echo: EF 50-55%, Gr2 DD, mild MR, mildly dil LA, Ao sclerosis, mild TR;  c. 07/2016 Echo: EF 45-50%, ? inf HK, mild MR, mildly dil LA.  [name] PAF (paroxysmal atrial fibrillation) (HCC)    a. ? Dx 2014-->s/p DCCV;  b. CHA2DS2VASc = 6-->chronic coumadin.  [name] RBBB   . Type II diabetes mellitus ([place])     Past Surgical History:  Procedure Laterality Date  . CATARACT EXTRACTION W/ INTRAOCULAR LENS  IMPLANT, BILATERAL Bilateral   . CORONARY ANGIOPLASTY WITH STENT PLACEMENT  09/2012   2 stents  . CORONARY STENT INTERVENTION N/A 08/19/2016   Procedure: Coronary Stent Intervention;  Surgeon: [name], MD;  Location: [place] [facility];  Service: Cardiovascular;  Laterality: N/A;  . ESOPHAGOGASTRODUODENOSCOPY N/A 12/20/2016   Procedure: ESOPHAGOGASTRODUODENOSCOPY (EGD);  Surgeon: Lin Landsman, MD;  Location: [facility] ENDOSCOPY;  Service: Gastroenterology;  Laterality: N/A;  . HERNIA REPAIR     "navel"  . LEFT HEART CATH AND CORONARY ANGIOGRAPHY N/A 08/14/2016   Procedure: Left Heart Cath and Coronary Angiography;  Surgeon: [name], MD;  Location: [place] [facility];  Service: Cardiovascular;  Laterality: N/A;  . TOE AMPUTATION Right    "big toe"    Prior to Admission medications   Medication Sig Start Date End Date Taking? Authorizing Provider  acetaminophen (TYLENOL) 500 MG tablet Take 500 mg by mouth every 6 (six) hours as needed.    [provider]  albuterol (PROVENTIL HFA;VENTOLIN HFA) 108 (90 BASE) MCG/ACT inhaler Inhale 2 puffs into the lungs every 6 (six) hours as needed for wheezing or shortness of breath. 11/04/14   Ahmed Prima, MD  allopurinol (ZYLOPRIM) 300 MG tablet Take 300 mg by mouth daily.  11/10/15   [provider]  atorvastatin (LIPITOR) 80 MG tablet Take 1 tablet (80 mg total) by mouth daily at 6 PM. 08/20/16   Cheryln Manly, NP  bosutinib (BOSULIF) 100 MG tablet Take 200 mg by mouth at bedtime. Take with food.     [provider]  enalapril (VASOTEC) 5 MG tablet Take 5 mg by mouth daily.    [provider]  famotidine (PEPCID) 20 MG tablet Take 20 mg by mouth at bedtime as needed for heartburn. Take 2 hours after chemo pill.    [provider]  fenofibrate 160 MG tablet Take 1 tablet (160 mg total) by mouth daily. 08/21/16   Cheryln Manly, NP  furosemide (LASIX) 20 MG tablet Take 20 mg by mouth daily.    [provider]  hydrocortisone 2.5 % ointment Apply topically 3 (three) times daily. 07/20/16   Triplett, Cari B, FNP  isosorbide mononitrate (IMDUR) 30 MG 24 hr tablet Take 1 tablet (30 mg total) by mouth daily. 09/21/16   Henreitta Leber, MD  LANTUS SOLOSTAR 100 UNIT/ML Solostar Pen Inject 20 Units into the skin  at bedtime. Patient taking differently: Inject 65 Units into the skin at bedtime.  12/11/15   Demetrios Loll, MD  levothyroxine (SYNTHROID, LEVOTHROID) 25 MCG tablet Take 25 mcg by mouth daily before breakfast.  10/27/15   [provider]  loperamide (IMODIUM A-D) 2 MG tablet Take 2 mg by mouth 3 (three) times daily as needed for diarrhea or loose stools.     [provider]  magnesium oxide (MAG-OX) 400 MG tablet Take 400 mg by mouth 2 (two) times daily.    [provider]  Magnesium Oxide 400 (240 Mg) MG TABS Take 1 tablet by mouth 2 (two) times daily. 11/20/16   [provider]  metoprolol succinate (TOPROL-XL) 50 MG 24 hr tablet Take 50 mg by mouth every morning. 12/04/15   [provider]  Multiple Vitamins-Minerals (MULTIVITAMIN ADULTS 50+ PO) Take 1 tablet by mouth every morning.    [provider]  nepafenac (NEVANAC) 0.1 % ophthalmic suspension Place 1 drop into both eyes 3 (three) times daily as needed.    [provider]  nitroGLYCERIN (NITROSTAT) 0.4 MG SL tablet Place 1 tablet (0.4 mg total) under  the tongue every 5 (five) minutes x 3 doses as needed for chest pain. 10/02/16   End, [name], MD  pantoprazole (PROTONIX) 40 MG tablet Take 1 tablet (40 mg total) by mouth daily. 12/21/16   Max Sane, MD  pregabalin (LYRICA) 50 MG capsule Take 50 mg by mouth 3 (three) times daily.    [provider]  ranitidine (ZANTAC) 300 MG tablet Take 300 mg by mouth daily.    [provider]  Spacer/Aero-Holding Chambers [facility] ADVANTAGE) MISC 1 each by Other route once. 11/04/14   Ahmed Prima, MD  ticagrelor (BRILINTA) 90 MG TABS tablet Take 1 tablet (90 mg total) by mouth 2 (two) times daily. 10/02/16   End, [name], MD  warfarin (COUMADIN) 2 MG tablet Take 3 mg by mouth at bedtime.     [provider]    Family History  Problem Relation Age of Onset  . Brain cancer Father   . Diabetes Sister   . Heart attack Sister      Social History  Substance Use Topics  . Smoking status: Former Smoker    Packs/day: 2.00    Years: 15.00    Types: Cigarettes  . Smokeless tobacco: Never Used     Comment: quit 1979  . Alcohol use No     Comment: previously drank heavily - quit 1979.    Allergies as of 01/07/2017 - Review Complete 12/20/2016  Allergen Reaction Noted  . Clopidogrel Anaphylaxis 07/06/2012  . Ciprofloxacin Itching 10/14/2012  . Spironolactone  03/29/2014  . Plavix [clopidogrel bisulfate] Anxiety 11/04/2014    Review of Systems:    All systems reviewed and negative except where noted in HPI.   Physical Exam:  There were no vitals taken for this visit. No LMP for male patient.  General:   Alert,  Well-developed, well-nourished, pleasant and cooperative in NAD Head:  Normocephalic and atraumatic. Eyes:  Sclera clear, no icterus.   Conjunctiva pink. Ears:  Normal auditory acuity. Nose:  No deformity, discharge, or lesions. Mouth:  No deformity or lesions,oropharynx pink & moist. Neck:  Supple; no masses or thyromegaly. Lungs:   Respirations even and unlabored.  Clear throughout to auscultation.   No wheezes, crackles, or rhonchi. No acute distress. Heart:  Regular rate and rhythm; no murmurs, clicks, rubs, or gallops. Abdomen:  Normal bowel sounds.  Soft, non-tender and non-distended without masses, hepatosplenomegaly or hernias noted.  No guarding or rebound tenderness.   Rectal: Nor performed Msk:  Symmetrical without gross deformities. Good, equal movement & strength bilaterally. Pulses:  Normal pulses noted. Extremities:  No clubbing or edema.  No cyanosis. Neurologic:  Alert and oriented x3;  grossly normal neurologically. Skin:  Intact without significant lesions or rashes. No jaundice. Psych:  Alert and cooperative. Normal mood and affect.  Imaging Studies: X-ray upper GI 12/19/2016 FINDINGS: There was normal pharyngeal anatomy and motility. Contrast flowed freely through the esophagus without evidence of stricture or mass. There was normal esophageal mucosa without evidence of irregularity or ulceration. Tertiary contractions of the distal third of the esophagus as can be seen with esophageal spasm. No evidence of reflux. No definite hiatal hernia was demonstrated.  At the end of the examination a 13 mm barium tablet was administered which transited through the esophagus and esophagogastric junction without delay.  IMPRESSION: 1. Tertiary contractions of the distal third of the esophagus as can be seen with esophageal spasm. 2. No esophageal stricture.  Assessment and Plan:   Lucas Caldwell is a 83 y.o. male with coronary artery disease, congestive heart failure, atrial fibrillation on anticoagulation with brilinta and Coumadin, CML, recent hospitalization on 12/19/2016 with AKI and 3 months history of intermittent dysphagia to solid food.  EGD showed moderate size Schatzki's ring, partially obstructing which is causing patient's dysphagia.  Patient prefers to get it dilated.  I discussed with him  that he needs to be off both Brilinta and Coumadin for 5 days prior to the EGD.  I will send a note to his cardiologist and primary care doctor for clearance to stay off anticoagulation prior to EGD.  -Schedule EGD after clearance from cardiology -Continue H2 blocker and Protonix -Avoid hard foods and suggested him to chew food thoroughly before swallowing to prevent food impaction  Follow up at the time of EGD  [name], MD

## 2017-01-07 NOTE — Progress Notes (Deleted)
Cardiology Office Note Date:  01/07/2017  Patient ID:  Lucas Caldwell, Lucas Caldwell Oct 07, 1933, MRN [id number] PCP:  Lucas Fairly, MD  Cardiologist:  Dr. Saunders Revel, MD  ***refresh   Chief Complaint: Hospital follow up  History of Present Illness: Lucas Caldwell is a 83 y.o. male with history of CAD s/p remote stenting as detailed below, mixed ICM/NICM, PAF on Coumadin, HTN, HLD, DM2 CKD stage III, CML, iron deficiency anemia, BPH, and hypothyroidism who presents for hospital follow up of recent admission to [facility] from 10/3-10/5 for dysphagia, SOB, and fatigue with decreased PO intake.  He has a history of NICM dating back to the late 1990s. He was later diagnosed with CAD and underwent stenting 2 at [facility] in 2014. Despite prior history of cardiomyopathy, he did have normalization of LV function by echo in 2012 with repeat echo in April 2017 showing an EF of 50-55%.  In May, 2018, he was admitted to [facility] with progressive dyspnea. Echo showed EF 45-50%. LHC showed 70% left main disease with severe RCA disease. He was transferred to [facility] and was evaluated by thoracic surgery, but felt to be too high risk for surgery. He then underwent successful PCI/DES of the right coronary artery on 08/19/2016.  It was felt the left main should be medically managed, though high risk stenting could be performed for recurrent symptoms. He was re-admitted in July, 2018 for chest pain and ruled out. Films were reviewed and Imdur was added, reserving high-risk left main PCI for recurrent symptoms. He was seen in the office on 10/31/16 and was doing well at that time. He was continued in optimal medical therapy and high-risk PCI was deferred for recurrent symptoms. He was admitted from 10/3-10/5 for dysphagia, SOB, and fatigue with decreased PO intake. He underwent barium swallow that showed esophageal spasm with moderate Schatzki ring in the lower esopagus, felt to explain his dysphagia and decreased PO intake. Outpatient GI follow  up was advised for dilation of Schatzki's ring, needing to be off anticoagulation 3-5 days prior to GI procedure. Cardiac enzymes were negative. His digoxin was stopped due to acute on CKD stage III and bradycardic rates in the 50s bpm. Digoxin level while admitted was noted to be 1. BNP 33. HGB slightly lower than baseline of 9-10 at 8.3 at discharge. INR was therapeutic at 2.29 at discharge. SCr had improved to 1.96 at time of discharge (baseline ~ 1.7). Imdur was resumed. He was continued on Coumadin and Brilinta along with Toprol XL, enalapril, and Lasix.   ***   Past Medical History:  Diagnosis Date  . Arthritis    "probably in his legs" (08/15/2016)  . Asthma   . Blind right eye   . BPH (benign prostatic hyperplasia)   . Breast asymmetry    Left breast is larger, present for several years.  [name] CAD (coronary artery disease)    a. Cath in the late 90's - reportedly ok;  b. 2014 s/p stenting x 2 @ [facility]; c 08/19/16 Cath/PCI with DES -> RCA, plan to treat LM 70% medically. Seen by surgery and felt to be too high risk for CABG.  . Chronic combined systolic (congestive) and diastolic (congestive) heart failure ([place])    a. Previously reduced EF-->50% by echo in 2012;  b. 06/2015 Echo: EF 50-55%  c. 07/2016 Echo: EF 45-50%.  . CKD (chronic kidney disease), stage III ([place])   . CML (chronic myelocytic leukemia) ([place])   . GERD (gastroesophageal reflux disease)   .  Gout   . Hyperlipemia   . Hypertension   . Hypothyroidism   . Iron deficiency anemia   . Migraine    "in the 1960s" (08/15/2016)  . NICM (nonischemic cardiomyopathy) ([place])    a. Previously reduced EF-->50% by echo in 2012;  b. 06/2015 Echo: EF 50-55%, Gr2 DD, mild MR, mildly dil LA, Ao sclerosis, mild TR;  c. 07/2016 Echo: EF 45-50%, ? inf HK, mild MR, mildly dil LA.  [name] PAF (paroxysmal atrial fibrillation) (HCC)    a. ? Dx 2014-->s/p DCCV;  b. CHA2DS2VASc = 6-->chronic coumadin.  [name] RBBB   . Type II diabetes mellitus ([place])     Past  Surgical History:  Procedure Laterality Date  . CATARACT EXTRACTION W/ INTRAOCULAR LENS  IMPLANT, BILATERAL Bilateral   . CORONARY ANGIOPLASTY WITH STENT PLACEMENT  09/2012   2 stents  . CORONARY STENT INTERVENTION N/A 08/19/2016   Procedure: Coronary Stent Intervention;  Surgeon: [name], MD;  Location: [place] [facility];  Service: Cardiovascular;  Laterality: N/A;  . ESOPHAGOGASTRODUODENOSCOPY N/A 12/20/2016   Procedure: ESOPHAGOGASTRODUODENOSCOPY (EGD);  Surgeon: Lin Landsman, MD;  Location: [facility] ENDOSCOPY;  Service: Gastroenterology;  Laterality: N/A;  . HERNIA REPAIR     "navel"  . LEFT HEART CATH AND CORONARY ANGIOGRAPHY N/A 08/14/2016   Procedure: Left Heart Cath and Coronary Angiography;  Surgeon: [name], MD;  Location: [place] [facility];  Service: Cardiovascular;  Laterality: N/A;  . TOE AMPUTATION Right    "big toe"    No outpatient prescriptions have been marked as taking for the 01/08/17 encounter (Appointment) with Rise Mu, PA-C.    Allergies:   Clopidogrel; Ciprofloxacin; Spironolactone; and Plavix [clopidogrel bisulfate]   Social History:  The patient  reports that he has quit smoking. His smoking use included Cigarettes. He has a 30.00 pack-year smoking history. He has never used smokeless tobacco. He reports that he does not drink alcohol or use drugs.   Family History:  The patient's family history includes Brain cancer in his father; Diabetes in his sister; Heart attack in his sister.  ROS:   ROS   PHYSICAL EXAM: *** VS:  There were no vitals taken for this visit. BMI: There is no height or weight on file to calculate BMI.  Physical Exam   EKG:  Was ordered and interpreted by me today. Shows ***  Recent Labs: 10/16/2016: ALT 21 12/18/2016: B Natriuretic Peptide 33.0; TSH 1.971 12/20/2016: BUN 25; Creatinine, Ser 1.96; Hemoglobin 8.3; Platelets 222; Potassium 4.2; Sodium 136  08/16/2016: Cholesterol 107; HDL 24; LDL Cholesterol 15;  Total CHOL/HDL Ratio 4.5; Triglycerides 338; VLDL 68   Estimated Creatinine Clearance: 31.5 mL/min (A) (by C-G formula based on SCr of 1.96 mg/dL (H)).   Wt Readings from Last 3 Encounters:  01/07/17 204 lb 9.6 oz (92.8 kg)  12/18/16 196 lb 1.6 oz (89 kg)  10/31/16 198 lb 12 oz (90.2 kg)     Other studies reviewed: Additional studies/records reviewed today include: summarized above  ASSESSMENT AND PLAN:  1. CAD: ***. Continue Brilinta. Not on ASA given the patient is on Coumadin as below. Continue Imdur.  2. PAF: ***. On Coumadin per [facility]. CHADS2VASc at least 6 (CHF, HTN, age x 2, DM, vascular disease). GI planning for esophageal dilatation ***. He will need to hold Coumadin 3-5 days prior to procedure. *** Given he has no prior history of stroke, no mechanical heart valves, and there is increased risk of bleeding peri-procedural with esophageal  dilatation, there is no indication to bridge him with Lovenox at this time. Resume Coumadin per usual regimen following procedure as soon as deemed able by treating physician. Continue to hold digoxin given his advanced age and CKD. Check digoxin level (no longer taking).  3. Mixed NICM/ICM: ***. Continue optimal medical therapy with Toprol XL, enalapril, and Lasix. Not on spironolactone given CKD.  4. Schatzki's ring/esophageal spasm/dysphagia: Seen by GI 10/23 ***. Imdur as above.  5. Pre-procedure cardiac evaluation: -PREOPERATIVE CARDIAC RISK ASSESSMENT:   Revised Cardiac Risk Index:  High Risk Surgery (defined as Intraperitoneal, intrathoracic or suprainguinal vascular): no; (esophageal dilatation)  Active CAD: ***  CHF: ***  Cerebrovascular Disease: ***   Diabetes: yes; On Insulin: no  CKD (Cr >~ 2): ***   Total: *** Estimated Risk of Adverse Outcome: *** risk for low risk procedure. Estimated Risk of MI, PE, VF/VT (Cardiac Arrest), Complete Heart Block: *** %   ACC/AHA Guidelines:  Step 1 - Need for Emergency  Surgery: ***   If Yes - go straight to OR with perioperative surveillance  Step 2 - Active Cardiac Conditions (Unstable Angina, Decompensated HF, Significant  Arrhytmias - Complete HB, Mobitz II, Symptomatic VT or SVT, Severe Aortic Stenosis - mean gradient > 40 mmHg, Valve area < 1.0 cm2): ***   If Yes - Evaluate & Treat per ACC/AHA Guidelines  Step 3 -  Low Risk Surgery: ***  If Yes --> proceed to OR  If No --> Step 4  Step 4 - Functional Capacity >= 4 METS without symptoms: ***  If Yes --> proceed to OR  If No --> Step 5  Step 5 --  Clinical Risk Factors (CRF)  - Zero --> proceed to OR  6. CKD stage III: Check bmet. Needs follow up with nephrology.  7. Anemia of chronic disease/CML: Check cbc to assess for stability of HGB. Needs follow up with PCP.  8. HTN:   Disposition: F/u with *** in   Current medicines are reviewed at length with the patient today.  The patient did not have any concerns regarding medicines.  Melvern Banker PA-C 01/07/2017 3:14 PM     [place] [address] [place], [place] [address] [phone]

## 2017-01-08 ENCOUNTER — Other Ambulatory Visit
Admission: RE | Admit: 2017-01-08 | Discharge: 2017-01-08 | Disposition: A | Discharge status: Home / Self Care | Payer: Medicare HMO | Source: Ambulatory Visit | Attending: Internal Medicine | Admitting: Internal Medicine

## 2017-01-08 ENCOUNTER — Encounter: Payer: Self-pay | Admitting: Internal Medicine

## 2017-01-08 ENCOUNTER — Ambulatory Visit (INDEPENDENT_AMBULATORY_CARE_PROVIDER_SITE_OTHER): Payer: Medicare HMO

## 2017-01-08 ENCOUNTER — Ambulatory Visit: Payer: Medicare HMO | Admitting: Physician Assistant

## 2017-01-08 ENCOUNTER — Ambulatory Visit (INDEPENDENT_AMBULATORY_CARE_PROVIDER_SITE_OTHER): Payer: Medicare HMO | Admitting: Internal Medicine

## 2017-01-08 VITALS — BP 120/60 | HR 70 | Ht 66.0 in | Wt 204.8 lb

## 2017-01-08 DIAGNOSIS — D649 Anemia, unspecified: Secondary | ICD-10-CM

## 2017-01-08 DIAGNOSIS — I25118 Atherosclerotic heart disease of native coronary artery with other forms of angina pectoris: Secondary | ICD-10-CM

## 2017-01-08 DIAGNOSIS — I10 Essential (primary) hypertension: Secondary | ICD-10-CM

## 2017-01-08 DIAGNOSIS — I2510 Atherosclerotic heart disease of native coronary artery without angina pectoris: Secondary | ICD-10-CM | POA: Insufficient documentation

## 2017-01-08 DIAGNOSIS — R0602 Shortness of breath: Secondary | ICD-10-CM | POA: Diagnosis present

## 2017-01-08 DIAGNOSIS — I5022 Chronic systolic (congestive) heart failure: Secondary | ICD-10-CM

## 2017-01-08 DIAGNOSIS — I4891 Unspecified atrial fibrillation: Secondary | ICD-10-CM

## 2017-01-08 DIAGNOSIS — I480 Paroxysmal atrial fibrillation: Secondary | ICD-10-CM | POA: Diagnosis present

## 2017-01-08 DIAGNOSIS — K222 Esophageal obstruction: Secondary | ICD-10-CM

## 2017-01-08 DIAGNOSIS — Z7901 Long term (current) use of anticoagulants: Secondary | ICD-10-CM

## 2017-01-08 DIAGNOSIS — E785 Hyperlipidemia, unspecified: Secondary | ICD-10-CM | POA: Diagnosis not present

## 2017-01-08 LAB — LIPID PANEL
Cholesterol: 99 mg/dL (ref 0–200)
HDL: 21 mg/dL — ABNORMAL LOW (ref >40)
LDL CALC: 54 mg/dL (ref 0–99)
TRIGLYCERIDES: 118 mg/dL (ref <150)
Total CHOL/HDL Ratio: 4.7 RATIO
VLDL: 24 mg/dL (ref 0–40)

## 2017-01-08 LAB — BASIC METABOLIC PANEL
ANION GAP: 8 (ref 5–15)
BUN: 23 mg/dL — ABNORMAL HIGH (ref 6–20)
CHLORIDE: 106 mmol/L (ref 101–111)
CO2: 21 mmol/L — AB (ref 22–32)
Calcium: 9.3 mg/dL (ref 8.9–10.3)
Creatinine, Ser: 2.04 mg/dL — ABNORMAL HIGH (ref 0.61–1.24)
GFR calc non Af Amer: 29 mL/min — ABNORMAL LOW (ref >60)
GFR, EST AFRICAN AMERICAN: 33 mL/min — AB (ref >60)
GLUCOSE: 223 mg/dL — AB (ref 65–99)
Potassium: 4.2 mmol/L (ref 3.5–5.1)
Sodium: 135 mmol/L (ref 135–145)

## 2017-01-08 LAB — CBC WITH DIFFERENTIAL/PLATELET
BASOS ABS: 0.1 10*3/uL (ref 0–0.1)
BASOS PCT: 1 %
Eosinophils Absolute: 0.2 10*3/uL (ref 0–0.7)
Eosinophils Relative: 3 %
HEMATOCRIT: 29.2 % — AB (ref 40.0–52.0)
HEMOGLOBIN: 9.4 g/dL — AB (ref 13.0–18.0)
LYMPHS PCT: 20 %
Lymphs Abs: 1.3 10*3/uL (ref 1.0–3.6)
MCH: 25.7 pg — ABNORMAL LOW (ref 26.0–34.0)
MCHC: 32.3 g/dL (ref 32.0–36.0)
MCV: 79.6 fL — AB (ref 80.0–100.0)
MONO ABS: 0.8 10*3/uL (ref 0.2–1.0)
MONOS PCT: 11 %
NEUTROS ABS: 4.5 10*3/uL (ref 1.4–6.5)
NEUTROS PCT: 65 %
Platelets: 234 10*3/uL (ref 150–440)
RBC: 3.66 MIL/uL — ABNORMAL LOW (ref 4.40–5.90)
RDW: 20.3 % — AB (ref 11.5–14.5)
WBC: 6.9 10*3/uL (ref 3.8–10.6)

## 2017-01-08 LAB — POCT INR: INR: 2.5

## 2017-01-08 MED ORDER — FERROUS SULFATE 325 (65 FE) MG PO TBEC: 325.0000 mg | DELAYED_RELEASE_TABLET | Freq: Three times a day (TID) | ORAL | 3 refills | Status: DC

## 2017-01-08 NOTE — Progress Notes (Signed)
Follow-up Outpatient Visit Date: 01/08/2017  Primary Care Provider: [name], DO [address] [id number]  Chief Complaint: Shortness of breath  HPI:  [name] is a 83 y.o. year-old male with history of coronary artery disease, mixed ischemic and nonischemic cardiomyopathy, hypertension, hyperlipidemia, type 2 diabetes mellitus, paroxysmal atrial fibrillation, chronic kidney disease stage III, CML, iron deficiency anemia, BPH, and hypothyroidism, who presents for follow-up of coronary artery disease and cardiomyopathy.  Mr. Rollo was last seen in our office in August.  Since that time, he has done relatively well, though he has noted some continued shortness of breath with mild activity.  He also notes occasional edema in his legs as well as leg cramps.  He has not had any chest pain.  He has stable two-pillow orthopnea without PND.  Mr. Klahn was admitted to [facility] earlier this month with dysphasia and acute on chronic anemia.  He underwent upper endoscopy and was found to have a Schatzki's ring and gastric erosion with stigmata of recent bleeding.  Intervention was not performed at the time, as the patient was on dual antiplatelet therapy following PCI earlier this year.  Mr. Dibari has not noted any melena or hematochezia.  He still has some trouble with swallowing, feeling as though some food gets stuck just above his stomach.  This typically resolves after belching.  He remains on ticagrelor and warfarin.  --------------------------------------------------------------------------------------------------  Past Medical History:  Diagnosis Date  . Arthritis    "probably in his legs" (08/15/2016)  . Asthma   . Blind right eye   . BPH (benign prostatic hyperplasia)   . Breast asymmetry    Left breast is larger, present for several years.  [name] CAD (coronary artery disease)    a. Cath in the late 90's - reportedly ok;  b. 2014 s/p stenting x 2 @ [facility]; c 08/19/16  Cath/PCI with DES -> RCA, plan to treat LM 70% medically. Seen by surgery and felt to be too high risk for CABG.  . Chronic combined systolic (congestive) and diastolic (congestive) heart failure ([place])    a. Previously reduced EF-->50% by echo in 2012;  b. 06/2015 Echo: EF 50-55%  c. 07/2016 Echo: EF 45-50%.  . CKD (chronic kidney disease), stage III ([place])   . CML (chronic myelocytic leukemia) ([place])   . GERD (gastroesophageal reflux disease)   . Gout   . Hyperlipemia   . Hypertension   . Hypothyroidism   . Iron deficiency anemia   . Migraine    "in the 1960s" (08/15/2016)  . NICM (nonischemic cardiomyopathy) ([place])    a. Previously reduced EF-->50% by echo in 2012;  b. 06/2015 Echo: EF 50-55%, Gr2 DD, mild MR, mildly dil LA, Ao sclerosis, mild TR;  c. 07/2016 Echo: EF 45-50%, ? inf HK, mild MR, mildly dil LA.  [name] PAF (paroxysmal atrial fibrillation) (HCC)    a. ? Dx 2014-->s/p DCCV;  b. CHA2DS2VASc = 6-->chronic coumadin.  [name] RBBB   . Type II diabetes mellitus ([place])    Past Surgical History:  Procedure Laterality Date  . CATARACT EXTRACTION W/ INTRAOCULAR LENS  IMPLANT, BILATERAL Bilateral   . CORONARY ANGIOPLASTY WITH STENT PLACEMENT  09/2012   2 stents  . CORONARY STENT INTERVENTION N/A 08/19/2016   Procedure: Coronary Stent Intervention;  Surgeon: [name], MD;  Location: [place] [facility];  Service: Cardiovascular;  Laterality: N/A;  . ESOPHAGOGASTRODUODENOSCOPY N/A 12/20/2016   Procedure: ESOPHAGOGASTRODUODENOSCOPY (EGD);  Surgeon: Lin Landsman,  MD;  Location: [facility] ENDOSCOPY;  Service: Gastroenterology;  Laterality: N/A;  . HERNIA REPAIR     "navel"  . LEFT HEART CATH AND CORONARY ANGIOGRAPHY N/A 08/14/2016   Procedure: Left Heart Cath and Coronary Angiography;  Surgeon: [name], MD;  Location: [place] CV LAB;  Service: Cardiovascular;  Laterality: N/A;  . TOE AMPUTATION Right    "big toe"    No outpatient prescriptions have been marked as taking for the  01/08/17 encounter (Office Visit) with Zakiyah Diop, [name], MD.    Allergies: Clopidogrel; Ciprofloxacin; Spironolactone; and Plavix [clopidogrel bisulfate]  Social History   Social History  . Marital status: Married    Spouse name: N/A  . Number of children: N/A  . Years of education: N/A   Occupational History  . 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   Social History Main Topics  . Smoking status: Former Smoker    Packs/day: 2.00    Years: 15.00    Types: Cigarettes  . Smokeless tobacco: Never Used     Comment: quit 1979  . Alcohol use No     Comment: previously drank heavily - quit 1979.  . Drug use: No  . Sexual activity: No   Other Topics Concern  . Not on file   Social History Narrative  . No narrative on file    Family History  Problem Relation Age of Onset  . Brain cancer Father   . Diabetes Sister   . Heart attack Sister     Review of Systems: A 12-system review of systems was performed and was negative except as noted in the HPI.  --------------------------------------------------------------------------------------------------  Physical Exam: BP 120/60 (BP Location: Left Arm, Patient Position: Sitting, Cuff Size: Normal)   Pulse 70   Ht 5\' 6"  (1.676 m)   Wt 204 lb 12 oz (92.9 kg)   BMI 33.05 kg/m   General: Obese man, seated comfortably in the exam room. HEENT: No conjunctival pallor or scleral icterus. Moist mucous membranes.  OP clear. Neck: Supple without lymphadenopathy, thyromegaly, JVD, or HJR. Lungs: Normal work of breathing. Clear to auscultation bilaterally without wheezes or crackles. Heart: Regular rate and rhythm without murmurs, rubs, or gallops. Non-displaced PMI. Abd: Bowel sounds present. Soft, NT/ND without hepatosplenomegaly Ext: Trace ankle edema bilaterally. Radial, PT, and DP pulses are 2+ bilaterally. Skin: Warm and dry without rash.  EKG: Normal sinus rhythm with bifascicular block (right bundle branch block and  left anterior fascicular block).  Lab Results  Component Value Date   WBC 6.9 01/08/2017   HGB 9.4 (L) 01/08/2017   HCT 29.2 (L) 01/08/2017   MCV 79.6 (L) 01/08/2017   PLT 234 01/08/2017    Lab Results  Component Value Date   NA 135 01/08/2017   K 4.2 01/08/2017   CL 106 01/08/2017   CO2 21 (L) 01/08/2017   BUN 23 (H) 01/08/2017   CREATININE 2.04 (H) 01/08/2017   GLUCOSE 223 (H) 01/08/2017   ALT 21 10/16/2016    Lab Results  Component Value Date   CHOL 99 01/08/2017   HDL 21 (L) 01/08/2017   LDLCALC 54 01/08/2017   TRIG 118 01/08/2017   CHOLHDL 4.7 01/08/2017    --------------------------------------------------------------------------------------------------  ASSESSMENT AND PLAN: Coronary artery disease with stable angina Mr. Kuhnert has not had any further chest pain since his last visit in our office.  [name] tolerating his antianginal therapy consisting of metoprolol and isosorbide mononitrate well.  We will not make any medication  changes today.  He will need to stay on warfarin and ticagrelor for as long as possible, ideally 12 months.  We will continue to defer PCI to his ostial LMCA stenosis, which is not critical.  Chronic systolic heart failure secondary to ischemic and nonischemic cardiomyopathy Mr. Koval has trace edema but otherwise appears euvolemic on exam.  He continues to have exertional dyspnea consistent with NYHA class III heart failure.  Given his chronic kidney disease, I am hesitant to escalate his diuresis.  I will check a basic metabolic panel today as well as a CBC to ensure that his hemoglobin has not dropped further in the setting of recent GI bleed and shortness of breath.  Anemia Hemoglobin has been chronically low but dropped after recent upper GI bleed.  Endoscopy showed gastric erosion with stigmata of recent bleeding.  I will recheck a CBC today.  Given low iron level and ferritin during recent admission, I have recommended that Mr. Tigert  begin taking ferrous sulfate 325 mg 3 times daily with meals.  Schatzki's ring Mr. Kissoon has been scheduled for dilation of Schatzki's ring on 01/30/17.  I do not think that it is safe to discontinue all anticoagulation and antiplatelet therapy at this point.  If possible, I recommend that any elective procedure be deferred until it has been at least 6 months since his most recent stent placement (08/19/16).  At that point, we could consider discontinuation of warfarin and ticagrelor.  I recommend that aspirin 81 mg daily be used continued in the periprocedural period.  In general, endoscopic procedures are low risk.  However, Mr. Bunda complex medical history places him at higher risk for periprocedural complications.  Hyperlipidemia Mr. Sahakian is tolerating atorvastatin and fenofibrate well.  Labs today show LDL at goal.  No medication changes at this time.  Hypertension Blood pressure is well controlled today.  We will continue her current medication regimen and check a BMP today to ensure stable renal function and electrolytes.  Follow-up: Return to clinic in 2 months.  [name], MD 01/10/2017 12:58 PM

## 2017-01-08 NOTE — Patient Instructions (Signed)
Medication Instructions:  Your physician has recommended you make the following change in your medication:  1- START Ferrous Sulfate 325mg  (1 tablet) by mouth three times a day with meals.   Labwork: Your physician recommends that you return for lab work in: TODAY (BMP, CBC). - Please go to the [facility]. You will check in at the front desk to the right as you walk into the atrium. Valet Parking is offered if needed.    Testing/Procedures: none  Follow-Up: Your physician recommends that you schedule a follow-up appointment in: Mid- December with Dr END.  If you need a refill on your cardiac medications before your next appointment, please call your pharmacy.

## 2017-01-09 ENCOUNTER — Ambulatory Visit: Payer: Medicare HMO | Admitting: Cardiovascular Disease

## 2017-01-09 NOTE — Unmapped (Signed)
[facility] Refill Coordination Note  Specialty Medication(s): Bosulif 100mg       Ivan Pierce, DOB: 06-29-1933  Phone: [phone] (home) , Alternate phone contact: N/A  Phone or address changes today?: No  All above HIPAA information was verified with patient's family member.  Shipping Address: [address]  [place] [address]   Insurance changes? No    Completed refill call assessment today to schedule patient's medication shipment from the [facility] [phone]).      Confirmed the medication and dosage are correct and have not changed: Yes, regimen is correct and unchanged.    Confirmed patient started or stopped the following medications in the past month:  No, there are no changes reported at this time.    Are you tolerating your medication?:  Ivan Pierce reports tolerating the medication.    ADHERENCE    Is this medicine transplant or covered by Medicare Part B? No.        Did you miss any doses in the past 4 weeks? No missed doses reported.    FINANCIAL/SHIPPING    Delivery Scheduled: Yes, Expected medication delivery date: 01/15/17     [place] did not have any additional questions at this time.    Delivery address validated in [facility] scheduling system: Yes, address listed in [facility] is correct.    We will follow up with patient monthly for standard refill processing and delivery.      Thank you,  Rea [employer]   [facility] Specialty Pharmacist

## 2017-01-10 ENCOUNTER — Telehealth: Payer: Self-pay

## 2017-01-10 NOTE — Progress Notes (Deleted)
[place]  [phone] [phone]) [phone]  ID: Lucas Caldwell OB: 1933/11/10  MR#: [id number]  JEH#:[id number]  Patient Care Team: [name], DO as PCP - General (Family Medicine)  CHIEF COMPLAINT: CML  INTERVAL HISTORY: Patient is an 83 year old male with a history of CML on treatment who is admitted for generalized weakness and fatigue with increasing shortness of breath. He currently feels improved from admission. He receives all his treatment at [facility] and this expressed interest in transferring care to [facility] to be closer to home. She has no neurologic complaints. He denies any fevers. He continues to have persistent weakness and fatigue. He denies any chest pain or cough. He denies any nausea, vomiting, constipation, or diarrhea. He has no urinary complaints. Patient offers no further specific complaints.  REVIEW OF SYSTEMS:   ROS  As per HPI. Otherwise, a complete review of systems is negative.  PAST MEDICAL HISTORY: Past Medical History:  Diagnosis Date  . Arthritis    "probably in his legs" (08/15/2016)  . Asthma   . Blind right eye   . BPH (benign prostatic hyperplasia)   . Breast asymmetry    Left breast is larger, present for several years.  [name] CAD (coronary artery disease)    a. Cath in the late 90's - reportedly ok;  b. 2014 s/p stenting x 2 @ [facility]; c 08/19/16 Cath/PCI with DES -> RCA, plan to treat LM 70% medically. Seen by surgery and felt to be too high risk for CABG.  . Chronic combined systolic (congestive) and diastolic (congestive) heart failure ([place])    a. Previously reduced EF-->50% by echo in 2012;  b. 06/2015 Echo: EF 50-55%  c. 07/2016 Echo: EF 45-50%.  . CKD (chronic kidney disease), stage III ([place])   . CML (chronic myelocytic leukemia) ([place])   . GERD (gastroesophageal reflux disease)   . Gout   . Hyperlipemia   . Hypertension   . Hypothyroidism   . Iron deficiency anemia   . Migraine    "in the 1960s"  (08/15/2016)  . NICM (nonischemic cardiomyopathy) ([place])    a. Previously reduced EF-->50% by echo in 2012;  b. 06/2015 Echo: EF 50-55%, Gr2 DD, mild MR, mildly dil LA, Ao sclerosis, mild TR;  c. 07/2016 Echo: EF 45-50%, ? inf HK, mild MR, mildly dil LA.  [name] PAF (paroxysmal atrial fibrillation) (HCC)    a. ? Dx 2014-->s/p DCCV;  b. CHA2DS2VASc = 6-->chronic coumadin.  [name] RBBB   . Type II diabetes mellitus ([place])     PAST SURGICAL HISTORY: Past Surgical History:  Procedure Laterality Date  . CATARACT EXTRACTION W/ INTRAOCULAR LENS  IMPLANT, BILATERAL Bilateral   . CORONARY ANGIOPLASTY WITH STENT PLACEMENT  09/2012   2 stents  . CORONARY STENT INTERVENTION N/A 08/19/2016   Procedure: Coronary Stent Intervention;  Surgeon: [name], MD;  Location: [place] CV LAB;  Service: Cardiovascular;  Laterality: N/A;  . ESOPHAGOGASTRODUODENOSCOPY N/A 12/20/2016   Procedure: ESOPHAGOGASTRODUODENOSCOPY (EGD);  Surgeon: Lin Landsman, MD;  Location: [facility] ENDOSCOPY;  Service: Gastroenterology;  Laterality: N/A;  . HERNIA REPAIR     "navel"  . LEFT HEART CATH AND CORONARY ANGIOGRAPHY N/A 08/14/2016   Procedure: Left Heart Cath and Coronary Angiography;  Surgeon: [name], MD;  Location: [place] CV LAB;  Service: Cardiovascular;  Laterality: N/A;  . TOE AMPUTATION Right    "big toe"    FAMILY HISTORY: Family History  Problem Relation Age of Onset  . Brain  cancer Father   . Diabetes Sister   . Heart attack Sister     ADVANCED DIRECTIVES (Y/N):  N  HEALTH MAINTENANCE: Social History  Substance Use Topics  . Smoking status: Former Smoker    Packs/day: 2.00    Years: 15.00    Types: Cigarettes  . Smokeless tobacco: Never Used     Comment: quit 1979  . Alcohol use No     Comment: previously drank heavily - quit 1979.     Colonoscopy:  PAP:  Bone density:  Lipid panel:  Allergies  Allergen Reactions  . Clopidogrel Anaphylaxis    Other reaction(s):  ANAPHYLAXIS;altered mental status;  (electrolytes "out of wack" and confusion per family; THEY DO NOT REMEMBER ANY OTHER REACTION)- MSB 10/10/15  . Ciprofloxacin Itching  . Spironolactone     Other reaction(s): Other (See Comments) gynecomastia  . Plavix [Clopidogrel Bisulfate] Anxiety    Current Outpatient Prescriptions  Medication Sig Dispense Refill  . acetaminophen (TYLENOL) 500 MG tablet Take 500 mg by mouth every 6 (six) hours as needed.    [name] albuterol (PROVENTIL HFA;VENTOLIN HFA) 108 (90 BASE) MCG/ACT inhaler Inhale 2 puffs into the lungs every 6 (six) hours as needed for wheezing or shortness of breath. 1 Inhaler 2  . allopurinol (ZYLOPRIM) 300 MG tablet Take 300 mg by mouth daily.   12  . atorvastatin (LIPITOR) 80 MG tablet Take 1 tablet (80 mg total) by mouth daily at 6 PM. 90 tablet 1  . bosutinib (BOSULIF) 100 MG tablet Take 200 mg by mouth at bedtime. Take with food.     . enalapril (VASOTEC) 5 MG tablet Take 5 mg by mouth daily.    . famotidine (PEPCID) 20 MG tablet Take 20 mg by mouth at bedtime as needed for heartburn. Take 2 hours after chemo pill.    . fenofibrate 160 MG tablet Take 1 tablet (160 mg total) by mouth daily. 30 tablet 3  . ferrous sulfate 325 (65 FE) MG EC tablet Take 1 tablet (325 mg total) by mouth 3 (three) times daily with meals. 270 tablet 3  . furosemide (LASIX) 20 MG tablet Take 20 mg by mouth daily.    . hydrocortisone 2.5 % ointment Apply topically 3 (three) times daily. 30 g 0  . isosorbide mononitrate (IMDUR) 30 MG 24 hr tablet Take 1 tablet (30 mg total) by mouth daily. 30 tablet 1  . LANTUS SOLOSTAR 100 UNIT/ML Solostar Pen Inject 20 Units into the skin at bedtime. (Patient taking differently: Inject 65 Units into the skin at bedtime. ) 15 mL 0  . levothyroxine (SYNTHROID, LEVOTHROID) 25 MCG tablet Take 25 mcg by mouth daily before breakfast.   12  . loperamide (IMODIUM A-D) 2 MG tablet Take 2 mg by mouth 3 (three) times daily as needed for  diarrhea or loose stools.     . magnesium oxide (MAG-OX) 400 MG tablet Take 400 mg by mouth 2 (two) times daily.    . Magnesium Oxide 400 (240 Mg) MG TABS Take 1 tablet by mouth 2 (two) times daily.  3  . metoprolol succinate (TOPROL-XL) 50 MG 24 hr tablet Take 50 mg by mouth every morning.  12  . Multiple Vitamins-Minerals (MULTIVITAMIN ADULTS 50+ PO) Take 1 tablet by mouth every morning.    . nepafenac (NEVANAC) 0.1 % ophthalmic suspension Place 1 drop into both eyes 3 (three) times daily as needed.    . nitroGLYCERIN (NITROSTAT) 0.4 MG SL tablet Place 1 tablet (0.4 mg  total) under the tongue every 5 (five) minutes x 3 doses as needed for chest pain. 75 tablet 2  . pantoprazole (PROTONIX) 40 MG tablet Take 1 tablet (40 mg total) by mouth daily. 30 tablet 0  . pregabalin (LYRICA) 50 MG capsule Take 50 mg by mouth 3 (three) times daily.    . ranitidine (ZANTAC) 300 MG tablet Take 300 mg by mouth daily.    [name] Spacer/Aero-Holding Chambers (OPTICHAMBER ADVANTAGE) MISC 1 each by Other route once. 1 each 0  . ticagrelor (BRILINTA) 90 MG TABS tablet Take 1 tablet (90 mg total) by mouth 2 (two) times daily. 180 tablet 3  . warfarin (COUMADIN) 2 MG tablet Take 3 mg by mouth at bedtime.      No current facility-administered medications for this visit.     OBJECTIVE: There were no vitals filed for this visit.   There is no height or weight on file to calculate BMI.    ECOG FS:{CHL ONC [id number]  General: Well-developed, well-nourished, no acute distress. Eyes: Pink conjunctiva, anicteric sclera. HEENT: Normocephalic, moist mucous membranes, clear oropharnyx. Lungs: Clear to auscultation bilaterally. Heart: Regular rate and rhythm. No rubs, murmurs, or gallops. Abdomen: Soft, nontender, nondistended. No organomegaly noted, normoactive bowel sounds. Musculoskeletal: No edema, cyanosis, or clubbing. Neuro: Alert, answering all questions appropriately. Cranial nerves grossly intact. Skin: No rashes  or petechiae noted. Psych: Normal affect. Lymphatics: No cervical, calvicular, axillary or inguinal LAD.   LAB RESULTS:  Lab Results  Component Value Date   NA 135 01/08/2017   K 4.2 01/08/2017   CL 106 01/08/2017   CO2 21 (L) 01/08/2017   GLUCOSE 223 (H) 01/08/2017   BUN 23 (H) 01/08/2017   CREATININE 2.04 (H) 01/08/2017   CALCIUM 9.3 01/08/2017   PROT 7.2 10/16/2016   ALBUMIN 3.8 10/16/2016   AST 34 10/16/2016   ALT 21 10/16/2016   ALKPHOS 91 10/16/2016   BILITOT <0.2 10/16/2016   GFRNONAA 29 (L) 01/08/2017   GFRAA 33 (L) 01/08/2017    Lab Results  Component Value Date   WBC 6.9 01/08/2017   NEUTROABS 4.5 01/08/2017   HGB 9.4 (L) 01/08/2017   HCT 29.2 (L) 01/08/2017   MCV 79.6 (L) 01/08/2017   PLT 234 01/08/2017     STUDIES: Dg Chest 2 View  Result Date: 12/18/2016 CLINICAL DATA:  Increasing shortness of breath over the last 4 months. History of coronary artery disease with stent placement, chronic kidney disease and hypertension. EXAM: CHEST  2 VIEW COMPARISON:  09/19/2016 and 08/16/2016. FINDINGS: The heart size and mediastinal contours are stable. There is atherosclerosis of the aorta. A coronary stent is noted on the lateral view. Stable borderline interstitial coarsening on the frontal examination without corresponding finding on the lateral view. There is no edema, confluent airspace opacity or pleural effusion. The bones appear unchanged. IMPRESSION: Stable radiographic appearance of the chest. No acute cardiopulmonary process. Electronically Signed   By: Richardean Sale M.D.   On: 12/18/2016 14:39   Dg Esophagus  Result Date: 12/19/2016 CLINICAL DATA:  Dysphagia EXAM: ESOPHOGRAM / BARIUM SWALLOW / BARIUM TABLET STUDY TECHNIQUE: Combined double contrast and single contrast examination performed using effervescent crystals, thick barium liquid, and thin barium liquid. The patient was observed with fluoroscopy swallowing a 13 mm barium sulphate tablet. FLUOROSCOPY  TIME:  Fluoroscopy Time:  0.9 minutes Radiation Exposure Index (if provided by the fluoroscopic device): 11.5 mGy Number of Acquired Spot Images: 0 COMPARISON:  None. FINDINGS: There was normal pharyngeal anatomy  and motility. Contrast flowed freely through the esophagus without evidence of stricture or mass. There was normal esophageal mucosa without evidence of irregularity or ulceration. Tertiary contractions of the distal third of the esophagus as can be seen with esophageal spasm. No evidence of reflux. No definite hiatal hernia was demonstrated. At the end of the examination a 13 mm barium tablet was administered which transited through the esophagus and esophagogastric junction without delay. IMPRESSION: 1. Tertiary contractions of the distal third of the esophagus as can be seen with esophageal spasm. 2. No esophageal stricture. Electronically Signed   By: Kathreen Devoid   On: 12/19/2016 09:00   US Renal  Result Date: 12/19/2016 CLINICAL DATA:  Acute renal failure EXAM: RENAL / URINARY TRACT ULTRASOUND COMPLETE COMPARISON:  06/20/2016 CT abdomen/pelvis. FINDINGS: Right Kidney: Length: 10.3 cm. Echogenicity within normal limits. No mass or hydronephrosis visualized. Incidental diffuse hepatic steatosis in the visualized right liver lobe. Left Kidney: Length: 10.8 cm. Echogenicity within normal limits. No mass or hydronephrosis visualized. Bladder: Appears normal for degree of bladder distention. Bilateral ureteral jets are demonstrated in the bladder. IMPRESSION: 1. Normal ultrasound of the kidneys and bladder, with no hydronephrosis. 2. Incidental diffuse hepatic steatosis. Electronically Signed   By: Ilona Sorrel M.D.   On: 12/19/2016 10:14    ASSESSMENT: CML  PLAN:    1. CML: Patient has received all of his care at [facility], but has expressed interest in transferring care to [facility]. He was initially placed on Gleevec, but was then transitioned to Bosulif secondary to side effects. By report, his last  BCR-ABL indicated a complete remission. Will repeat BCR-ABL today. No intervention is needed at this time. Patient can follow-up in the [place] 2-3 weeks for further evaluation. 2. Anemia: Mild. Unclear patient's baseline. Continue to monitor maintaining hemoglobin greater than 8.0 given patient's underlying cardiac disease.  Patient expressed understanding and was in agreement with this plan. He also understands that He can call clinic at any time with any questions, concerns, or complaints.   Cancer Staging No matching staging information was found for the patient.  Lloyd Huger, MD   01/10/2017 4:26 PM

## 2017-01-10 NOTE — Telephone Encounter (Signed)
Per Dr. Marisue Humble reply patients EGD with Dilation has been canceled.  We will reschedule in December. Patient and his wife [place] notified. See message from Dr. Saunders Revel below:      Hello Dr. Marius Ditch and Ms. Aretha Parrot,   I had the pleasure of seeing Mr. Cordle in the office this week. [name] doing quite well, given his significant coronary artery disease, heart failure, and recent GI bleed. If possible, I recommend that his esophageal dilation be postponed until December so that he can complete at least 6 months of warfarin and ticagrelor following his most recent coronary stent placement (08/19/16). At that time, we could stop his warfarin and ticagrelor and begin aspirin 81 mg daily. I ask that low-dose aspirin be continued through the periprocedural period to minimize the risk for stent thrombosis. I discussed these recommendations with Mr. Congrove and his wife, and they are in agreement.   Please let me know if you have any questions or concerns. Best wishes.   Chris End

## 2017-01-13 ENCOUNTER — Inpatient Hospital Stay: Payer: Medicare HMO | Admitting: Oncology

## 2017-01-14 MED FILL — BOSULIF/100MG/TABS: BOSULIF/100MG/TABS | 30 days supply | Qty: 60 | Fill #3

## 2017-01-15 ENCOUNTER — Inpatient Hospital Stay: Payer: Medicare HMO | Admitting: Oncology

## 2017-01-16 NOTE — Progress Notes (Signed)
Discussed with Dr. Saunders Revel. He had already addressed this and had declined cardiac clearance. He said he had communicated with GI via epic and they acknowledged the refusal of clearance. Christa Fasig PA-C

## 2017-01-17 ENCOUNTER — Telehealth: Payer: Self-pay | Admitting: Gastroenterology

## 2017-01-17 NOTE — Telephone Encounter (Signed)
*  STAT* If patient is at the pharmacy, call can be transferred to refill team.   1. Which medications need to be refilled? (please list name of each medication and dose if known) pantoprazole 40 mg, ranitidine 300 mg   2. Which pharmacy/location (including street and city if local pharmacy) is medication to be sent to? [facility]   3. Do they need a 30 day or 90 day supply? 30 day

## 2017-01-20 MED ORDER — ISOSORBIDE MONONITRATE ER 30 MG TABLET,EXTENDED RELEASE 24 HR
ORAL_TABLET | 1 refills | 0 days | Status: CP
Start: 2017-01-20 — End: 2017-03-21

## 2017-01-21 MED ORDER — PANTOPRAZOLE 40 MG TABLET,DELAYED RELEASE
ORAL_TABLET | Freq: Every day | ORAL | 3 refills | 0.00000 days | Status: CP
Start: 2017-01-21 — End: 2017-05-14

## 2017-01-22 ENCOUNTER — Encounter: Payer: Self-pay | Admitting: Family Medicine

## 2017-01-22 ENCOUNTER — Ambulatory Visit (INDEPENDENT_AMBULATORY_CARE_PROVIDER_SITE_OTHER): Payer: Medicare HMO | Admitting: Family Medicine

## 2017-01-22 VITALS — BP 111/61 | HR 67 | Temp 98.7°F | Resp 16 | Ht 66.0 in | Wt 205.8 lb

## 2017-01-22 DIAGNOSIS — K219 Gastro-esophageal reflux disease without esophagitis: Secondary | ICD-10-CM

## 2017-01-22 DIAGNOSIS — I480 Paroxysmal atrial fibrillation: Secondary | ICD-10-CM | POA: Diagnosis not present

## 2017-01-22 DIAGNOSIS — E1121 Type 2 diabetes mellitus with diabetic nephropathy: Secondary | ICD-10-CM

## 2017-01-22 DIAGNOSIS — I5022 Chronic systolic (congestive) heart failure: Secondary | ICD-10-CM

## 2017-01-22 DIAGNOSIS — Z7901 Long term (current) use of anticoagulants: Secondary | ICD-10-CM | POA: Diagnosis not present

## 2017-01-22 DIAGNOSIS — C9210 Chronic myeloid leukemia, BCR/ABL-positive, not having achieved remission: Secondary | ICD-10-CM

## 2017-01-22 DIAGNOSIS — I25118 Atherosclerotic heart disease of native coronary artery with other forms of angina pectoris: Secondary | ICD-10-CM | POA: Diagnosis not present

## 2017-01-22 DIAGNOSIS — I208 Other forms of angina pectoris: Secondary | ICD-10-CM | POA: Diagnosis not present

## 2017-01-22 DIAGNOSIS — I10 Essential (primary) hypertension: Secondary | ICD-10-CM | POA: Diagnosis not present

## 2017-01-22 DIAGNOSIS — D509 Iron deficiency anemia, unspecified: Secondary | ICD-10-CM | POA: Diagnosis not present

## 2017-01-22 DIAGNOSIS — N183 Chronic kidney disease, stage 3 unspecified: Secondary | ICD-10-CM

## 2017-01-22 DIAGNOSIS — E1142 Type 2 diabetes mellitus with diabetic polyneuropathy: Secondary | ICD-10-CM

## 2017-01-22 DIAGNOSIS — E1165 Type 2 diabetes mellitus with hyperglycemia: Secondary | ICD-10-CM

## 2017-01-22 DIAGNOSIS — IMO0002 Reserved for concepts with insufficient information to code with codable children: Secondary | ICD-10-CM

## 2017-01-22 NOTE — Unmapped (Signed)
This was ordered for Ivan Pierce based on recent EGD

## 2017-01-22 NOTE — Unmapped (Signed)
[name] called and [employer] sent over a request for pantoprazole sod DR 40mg  not currently on the med list     Please advise Rx request on desk

## 2017-01-22 NOTE — Patient Instructions (Addendum)
Thank you for coming to the clinic today.  1. Please call and schedule a hospital follow-up appointment with Dr Candiss Norse for kidney specialist. If you need a doctors referral let me know and we can place this order.  Ask Dr Candiss Norse about Enalapril and Lasix with regards to heart and CKD going forward.  And can ask about Allopurinol dosing.  [facility] ([place]) [address] [place], [place] [address] Phone: [phone]   Murlean Iba, MD  2. SWTICH Lantus insulin from bedtime now to [place] - inject 50 units (can do injection into the thighs, arms, buttocks, or abdomen)  Check sugar first thing in the morning (fasting) - if this reading is consistently < 150 for 3-5 days, then can reduce dose by 1 unit down to 49 etc to find a lower dose, since the kidneys are causing the insulin to LAST LONGER  Please schedule a Follow-up Appointment to: Return in about 2 months (around 03/24/2017) for DM A1c, CKD, GERD (f/u EGD).  If you have any other questions or concerns, please feel free to call the clinic or send a message through [place]. You may also schedule an earlier appointment if necessary.  Additionally, you may be receiving a survey about your experience at our clinic within a few days to 1 week by e-mail or mail. We value your feedback.  [name], DO [place]

## 2017-01-22 NOTE — Progress Notes (Deleted)
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 He currently feels improved from admission. He receives all his treatment at [facility] and this expressed interest in transferring care to [facility] to be closer to home. She has no neurologic complaints. He denies any fevers. He continues to have persistent weakness and fatigue. He denies any chest pain or cough. He denies any nausea, vomiting, constipation, or diarrhea. He has no urinary complaints. Patient offers no further specific complaints.  REVIEW OF SYSTEMS:   ROS  As per HPI. Otherwise, a complete review of systems is negative.  PAST MEDICAL HISTORY: Past Medical History:  Diagnosis Date  . Arthritis    "probably in his legs" (08/15/2016)  . Asthma   . Blind right eye   . BPH (benign prostatic hyperplasia)   . Breast asymmetry    Left breast is larger, present for several years.  [name] CAD (coronary artery disease)    a. Cath in the late 90's - reportedly ok;  b. 2014 s/p stenting x 2 @ [facility]; c 08/19/16 Cath/PCI with DES -> RCA, plan to treat LM 70% medically. Seen by surgery and felt to be too high risk for CABG.  . Chronic combined systolic (congestive) and diastolic (congestive) heart failure ([place])    a. Previously reduced EF-->50% by echo in 2012;  b. 06/2015 Echo: EF 50-55%  c. 07/2016 Echo: EF 45-50%.  . CKD (chronic kidney disease), stage III ([place])   . CML (chronic myelocytic leukemia) ([place])   . GERD (gastroesophageal reflux disease)   . Gout   . Hyperlipemia   . Hypertension   . Hypothyroidism   . Iron deficiency anemia   . Migraine    "in the 1960s"  (08/15/2016)  . NICM (nonischemic cardiomyopathy) ([place])    a. Previously reduced EF-->50% by echo in 2012;  b. 06/2015 Echo: EF 50-55%, Gr2 DD, mild MR, mildly dil LA, Ao sclerosis, mild TR;  c. 07/2016 Echo: EF 45-50%, ? inf HK, mild MR, mildly dil LA.  [name] PAF (paroxysmal atrial fibrillation) ([facility])    a. ? Dx 2014-->s/p DCCV;  b. CHA2DS2VASc = 6-->chronic coumadin.  [name] RBBB   . Type II diabetes mellitus ([place])     PAST SURGICAL HISTORY: Past Surgical History:  Procedure Laterality Date  . CATARACT EXTRACTION W/ INTRAOCULAR LENS  IMPLANT, BILATERAL Bilateral   . CORONARY ANGIOPLASTY WITH STENT PLACEMENT  09/2012   2 stents  . HERNIA REPAIR     "navel"  . TOE AMPUTATION Right    "big toe"    FAMILY HISTORY: Family History  Problem Relation Age of Onset  . Brain cancer Father   . Diabetes Sister   . Heart attack Sister     ADVANCED DIRECTIVES (Y/N):  N  HEALTH MAINTENANCE: Social History   Tobacco Use  . Smoking status: Former Smoker    Packs/day: 2.00    Years: 15.00    Pack years: 30.00    Types: Cigarettes  . Smokeless tobacco: Former Systems developer  . Tobacco comment: quit 1979  Substance Use Topics  . Alcohol use: No    Comment: previously drank heavily - quit 1979.  [name] Drug  use: No     Colonoscopy:  PAP:  Bone density:  Lipid panel:  Allergies  Allergen Reactions  . Clopidogrel Anaphylaxis    Other reaction(s): ANAPHYLAXIS;altered mental status;  (electrolytes "out of wack" and confusion per family; THEY DO NOT REMEMBER ANY OTHER REACTION)- MSB 10/10/15  . Ciprofloxacin Itching  . Spironolactone     Other reaction(s): Other (See Comments) gynecomastia  . Plavix [Clopidogrel Bisulfate] Anxiety    Current Outpatient Medications  Medication Sig Dispense Refill  . acetaminophen (TYLENOL) 500 MG tablet Take 500 mg by mouth every 6 (six) hours as needed.    [name] albuterol (PROVENTIL HFA;VENTOLIN HFA) 108 (90 BASE) MCG/ACT inhaler Inhale 2 puffs into the lungs every 6  (six) hours as needed for wheezing or shortness of breath. 1 Inhaler 2  . allopurinol (ZYLOPRIM) 300 MG tablet Take 300 mg by mouth daily.   12  . atorvastatin (LIPITOR) 80 MG tablet Take 1 tablet (80 mg total) by mouth daily at 6 PM. 90 tablet 1  . bosutinib (BOSULIF) 100 MG tablet Take 200 mg by mouth at bedtime. Take with food.     . enalapril (VASOTEC) 5 MG tablet Take 5 mg by mouth daily.    . famotidine (PEPCID) 20 MG tablet Take 20 mg by mouth at bedtime as needed for heartburn. Take 2 hours after chemo pill.    . fenofibrate 160 MG tablet Take 1 tablet (160 mg total) by mouth daily. 30 tablet 3  . ferrous sulfate 325 (65 FE) MG EC tablet Take 1 tablet (325 mg total) by mouth 3 (three) times daily with meals. 270 tablet 3  . furosemide (LASIX) 20 MG tablet Take 20 mg by mouth daily.    . hydrocortisone 2.5 % ointment Apply topically 3 (three) times daily. 30 g 0  . isosorbide mononitrate (IMDUR) 30 MG 24 hr tablet Take 1 tablet (30 mg total) by mouth daily. 30 tablet 1  . LANTUS SOLOSTAR 100 UNIT/ML Solostar Pen Inject 20 Units into the skin at bedtime. (Patient taking differently: Inject 65 Units into the skin at bedtime. ) 15 mL 0  . levothyroxine (SYNTHROID, LEVOTHROID) 25 MCG tablet Take 25 mcg by mouth daily before breakfast.   12  . loperamide (IMODIUM A-D) 2 MG tablet Take 2 mg by mouth 3 (three) times daily as needed for diarrhea or loose stools.     . magnesium oxide (MAG-OX) 400 MG tablet Take 400 mg by mouth 2 (two) times daily.    . Magnesium Oxide 400 (240 Mg) MG TABS Take 1 tablet by mouth 2 (two) times daily.  3  . metoprolol succinate (TOPROL-XL) 50 MG 24 hr tablet Take 50 mg by mouth every morning.  12  . Multiple Vitamins-Minerals (MULTIVITAMIN ADULTS 50+ PO) Take 1 tablet by mouth every morning.    . nepafenac (NEVANAC) 0.1 % ophthalmic suspension Place 1 drop into both eyes 3 (three) times daily as needed.    . nitroGLYCERIN (NITROSTAT) 0.4 MG SL tablet Place 1 tablet  (0.4 mg total) under the tongue every 5 (five) minutes x 3 doses as needed for chest pain. 75 tablet 2  . pantoprazole (PROTONIX) 40 MG tablet Take 1 tablet (40 mg total) by mouth daily. 30 tablet 0  . pregabalin (LYRICA) 50 MG capsule Take 50 mg by mouth 3 (three) times daily.    . ranitidine (ZANTAC) 300 MG tablet Take 300 mg by mouth daily.    [name] Spacer/Aero-Holding Chambers (OPTICHAMBER ADVANTAGE) MISC 1  each by Other route once. 1 each 0  . ticagrelor (BRILINTA) 90 MG TABS tablet Take 1 tablet (90 mg total) by mouth 2 (two) times daily. 180 tablet 3  . warfarin (COUMADIN) 2 MG tablet Take 3 mg by mouth at bedtime.      No current facility-administered medications for this visit.     OBJECTIVE: There were no vitals filed for this visit.   There is no height or weight on file to calculate BMI.    ECOG FS:{CHL ONC [id number]  General: Well-developed, well-nourished, no acute distress. Eyes: Pink conjunctiva, anicteric sclera. HEENT: Normocephalic, moist mucous membranes, clear oropharnyx. Lungs: Clear to auscultation bilaterally. Heart: Regular rate and rhythm. No rubs, murmurs, or gallops. Abdomen: Soft, nontender, nondistended. No organomegaly noted, normoactive bowel sounds. Musculoskeletal: No edema, cyanosis, or clubbing. Neuro: Alert, answering all questions appropriately. Cranial nerves grossly intact. Skin: No rashes or petechiae noted. Psych: Normal affect. Lymphatics: No cervical, calvicular, axillary or inguinal LAD.   LAB RESULTS:  Lab Results  Component Value Date   NA 135 01/08/2017   K 4.2 01/08/2017   CL 106 01/08/2017   CO2 21 (L) 01/08/2017   GLUCOSE 223 (H) 01/08/2017   BUN 23 (H) 01/08/2017   CREATININE 2.04 (H) 01/08/2017   CALCIUM 9.3 01/08/2017   PROT 7.2 10/16/2016   ALBUMIN 3.8 10/16/2016   AST 34 10/16/2016   ALT 21 10/16/2016   ALKPHOS 91 10/16/2016   BILITOT <0.2 10/16/2016   GFRNONAA 29 (L) 01/08/2017   GFRAA 33 (L) 01/08/2017    Lab  Results  Component Value Date   WBC 6.9 01/08/2017   NEUTROABS 4.5 01/08/2017   HGB 9.4 (L) 01/08/2017   HCT 29.2 (L) 01/08/2017   MCV 79.6 (L) 01/08/2017   PLT 234 01/08/2017     STUDIES: No results found.  ASSESSMENT: CML  PLAN:    1. CML: Patient has received all of his care at [facility], but has expressed interest in transferring care to [facility]. He was initially placed on Gleevec, but was then transitioned to Bosulif secondary to side effects. By report, his last BCR-ABL indicated a complete remission. Will repeat BCR-ABL today. No intervention is needed at this time. Patient can follow-up in the [place] 2-3 weeks for further evaluation. 2. Anemia: Mild. Unclear patient's baseline. Continue to monitor maintaining hemoglobin greater than 8.0 given patient's underlying cardiac disease.  Patient expressed understanding and was in agreement with this plan. He also understands that He can call clinic at any time with any questions, concerns, or complaints.   Cancer Staging No matching staging information was found for the patient.  Lloyd Huger, MD   01/22/2017 5:24 PM

## 2017-01-22 NOTE — Progress Notes (Signed)
Subjective:    Patient ID: Lucas Caldwell, male    DOB: [birth year], 83 y.o.   MRN: [id number]  Lucas Caldwell is a 83 y.o. male presenting on 01/22/2017 for Establish Care (anemic and dehydration --hospital)  History primarily provided by patient, also accompanied by and history by wife, [place].  [name] here to establish with new PCP locally after changing his healthcare team to [facility] locally in [facility] where he lives, no longer able to travel to [facility] for healthcare. Previous PCP Dr Juanell Fairly.  HPI   Specialists: Cardiology - Dr [name] End [facility]) Oncology - Dr Delight Hoh [facility]) Gastroenterology - Dr Sherri Sear ([facility])  Nephrology - Dr Murlean Iba ([place]) not established outpatient - seen in hospital for CKD)  Esophageal Dysphagia / Lower Esophageal Ring - Reviewed recent hospitalization at [facility] from 10/3 to 10/5 for intermittent dysphagia to solid food, ultimately had EGD on 10/5, treated with PPI and diet recommendations with outpatient GI follow-up for anticipated possible esophageal dilatation - Followed by [facility] Dr Marius Ditch, last seen 10/24 outpatient had prior unremarkable history without significant red flags prior to hospitalization, only identified etiology was partially obstructing moderate size Schatzki's ring on EGD, previously on Pepcid, then after hospitalization placed on Ranitidine 300mg  daily in AM and Protonix 40mg  PM, strict instructions to chew food thoroughly before swallowing, anticipated EGD with dilatation delayed now until 02/2017, after he was cleared by cardiology and to be off coumadin / brilinta for 5 days and only on ASA 81 (first needs to complete 6 months of anticoag) - Today reports doing well, chewing food significantly, no choking or dysphagia or any episodes of food impaction - Denies nausea vomiting, abdominal pain, regurgitation, weight loss  CKD-III, AKI resolved - Hospitalized 10/3 to 10/5 with some dehydration  and AoCKD, with Cr elevated up to 2.4, gradual improvement in hospital. Had Nephrology consult Dr Sherri Rad, had Renal US unremarkable, clinically consistent with diabetic nephropathy, he was restarted on low dose Enalapril at that time. - Complicated history with significant CAD, see below and CHF. Also taking diuretic with Lasix 20mg  daily with good results - History of BPH some intermittent LUTS, with nocturia especially  Anemia, secondary to CKD / CML - Followed by [facility] Heme/Onc now after hospitalization 12/2016, previously received care at [facility], now transferring care due to travel distance. Reviewed prior history on Gleevec then switched to Bosulif due to side effects, per note review testing showed remission - acute on chronic anemia, had Hgb in 8-9 range, received IV iron. Prior history Hgb >10 gradual decline - Admits darker stools after taking iron pills 325mg  2-3 times daily - GI side effects, has some diarrhea, takes Imodium. Uses Miralax PRN constipation - has outpatient f/u with Dr [name] tomorrow  CAD s/p stents (last 09/2016), CHF (NYHA Class III), PAF on chronic Anticoagulation - Followed by [facility] Cardiology, next visit in 1 week with Dr End - Last seen 01/08/17 in hospital follow-up, he has done well without chest pain, continues on BB and isosorbide. Continued on Coumadin and Brillinta for up to 12 mo goal, at least 6 months until proceed with EGD - On Brilinta since 08/2016, x 1 stent placed in [facility] and previously x 2 stents in [place] - On Coumadin long-term - Has PAF, with intermittent episodes, he is asymptomatic and is not aware - Admits trace LE edema and dyspnea on exertion with NYHA Class III  CHRONIC DM, Type 2: Reports no  concerns. Previously managed by PCP Dr Petra Kuba. Recent A1c was elevated A1c >10 CBGs: Avg 136 in AM fasting, Low 79, High < 200. Checks CBGs 1-2 x daily Meds: Lantus 50-52 units at bedtime once daily, not on other medications at this time Reports  good compliance. Tolerating well w/o side-effects Currently on ACEi Lifestyle: - Diet (tries to improve DM diet)  - Exercise (limited by dyspnea) - Had Diabetic Eye Exam, by Dr Ellin Mayhew 3 months ago  - Admits chronic DM Neuropathy, on lyrica reduced sensation to monofilament - Admits occasional hypoglycemia events occur usually 4-5am feel "sick" or "tremors" and eats some oatmeal or snack and it improves  Health Maintenance: - UTD Flu Vaccine 12/19/16 - UTD Pneumonia vaccines   Depression screen PHQ 2/9 01/22/2017  Decreased Interest 0  Down, Depressed, Hopeless 0  PHQ - 2 Score 0    Past Medical History:  Diagnosis Date  . Arthritis    "probably in his legs" (08/15/2016)  . Asthma   . Blind right eye   . BPH (benign prostatic hyperplasia)   . Breast asymmetry    Left breast is larger, present for several years.  [name] CAD (coronary artery disease)    a. Cath in the late 90's - reportedly ok;  b. 2014 s/p stenting x 2 @ [facility]; c 08/19/16 Cath/PCI with DES -> RCA, plan to treat LM 70% medically. Seen by surgery and felt to be too high risk for CABG.  . Chronic combined systolic (congestive) and diastolic (congestive) heart failure ([place])    a. Previously reduced EF-->50% by echo in 2012;  b. 06/2015 Echo: EF 50-55%  c. 07/2016 Echo: EF 45-50%.  . CKD (chronic kidney disease), stage III ([place])   . CML (chronic myelocytic leukemia) ([place])   . GERD (gastroesophageal reflux disease)   . Gout   . Hyperlipemia   . Hypertension   . Hypothyroidism   . Iron deficiency anemia   . Migraine    "in the 1960s" (08/15/2016)  . NICM (nonischemic cardiomyopathy) ([place])    a. Previously reduced EF-->50% by echo in 2012;  b. 06/2015 Echo: EF 50-55%, Gr2 DD, mild MR, mildly dil LA, Ao sclerosis, mild TR;  c. 07/2016 Echo: EF 45-50%, ? inf HK, mild MR, mildly dil LA.  [name] PAF (paroxysmal atrial fibrillation) (HCC)    a. ? Dx 2014-->s/p DCCV;  b. CHA2DS2VASc = 6-->chronic coumadin.  [name] RBBB   . Type II diabetes  mellitus ([place])    Past Surgical History:  Procedure Laterality Date  . CATARACT EXTRACTION W/ INTRAOCULAR LENS  IMPLANT, BILATERAL Bilateral   . CORONARY ANGIOPLASTY WITH STENT PLACEMENT  09/2012   2 stents  . HERNIA REPAIR     "navel"  . TOE AMPUTATION Right    "big toe"   Social History   Socioeconomic History  . Marital status: Married    Spouse name: Not on file  . Number of children: Not on file  . Years of education: Not on file  . Highest education level: Not on file  Social Needs  . Financial resource strain: Not on file  . Food insecurity - worry: Not on file  . Food insecurity - inability: Not on file  . Transportation needs - medical: Not on file  . Transportation needs - non-medical: Not on file  Occupational History  . 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  Tobacco Use  . Smoking status: Former Smoker    Packs/day: 2.00  Years: 15.00    Pack years: 30.00    Types: Cigarettes  . Smokeless tobacco: Former Systems developer  . Tobacco comment: quit 1979  Substance and Sexual Activity  . Alcohol use: No    Comment: previously drank heavily - quit 1979.  . Drug use: No  . Sexual activity: No  Other Topics Concern  . Not on file  Social History Narrative  . Not on file   Family History  Problem Relation Age of Onset  . Brain cancer Father   . Diabetes Sister   . Heart attack Sister    Current Outpatient Medications on File Prior to Visit  Medication Sig  . acetaminophen (TYLENOL) 500 MG tablet Take 500 mg by mouth every 6 (six) hours as needed.  [name] albuterol (PROVENTIL HFA;VENTOLIN HFA) 108 (90 BASE) MCG/ACT inhaler Inhale 2 puffs into the lungs every 6 (six) hours as needed for wheezing or shortness of breath.  . allopurinol (ZYLOPRIM) 300 MG tablet Take 300 mg by mouth daily.   [name] atorvastatin (LIPITOR) 80 MG tablet Take 1 tablet (80 mg total) by mouth daily at 6 PM.  . bosutinib (BOSULIF) 100 MG tablet Take 200 mg by mouth at bedtime.  Take with food.   . enalapril (VASOTEC) 5 MG tablet Take 5 mg by mouth daily.  . famotidine (PEPCID) 20 MG tablet Take 20 mg by mouth at bedtime as needed for heartburn. Take 2 hours after chemo pill.  . fenofibrate 160 MG tablet Take 1 tablet (160 mg total) by mouth daily.  . ferrous sulfate 325 (65 FE) MG EC tablet Take 1 tablet (325 mg total) by mouth 3 (three) times daily with meals.  . furosemide (LASIX) 20 MG tablet Take 20 mg by mouth daily.  . hydrocortisone 2.5 % ointment Apply topically 3 (three) times daily.  . isosorbide mononitrate (IMDUR) 30 MG 24 hr tablet Take 1 tablet (30 mg total) by mouth daily.  [name] LANTUS SOLOSTAR 100 UNIT/ML Solostar Pen Inject 20 Units into the skin at bedtime. (Patient taking differently: Inject 65 Units into the skin at bedtime. )  . levothyroxine (SYNTHROID, LEVOTHROID) 25 MCG tablet Take 25 mcg by mouth daily before breakfast.   . loperamide (IMODIUM A-D) 2 MG tablet Take 2 mg by mouth 3 (three) times daily as needed for diarrhea or loose stools.   . magnesium oxide (MAG-OX) 400 MG tablet Take 400 mg by mouth 2 (two) times daily.  . Magnesium Oxide 400 (240 Mg) MG TABS Take 1 tablet by mouth 2 (two) times daily.  . metoprolol succinate (TOPROL-XL) 50 MG 24 hr tablet Take 50 mg by mouth every morning.  . Multiple Vitamins-Minerals (MULTIVITAMIN ADULTS 50+ PO) Take 1 tablet by mouth every morning.  . nepafenac (NEVANAC) 0.1 % ophthalmic suspension Place 1 drop into both eyes 3 (three) times daily as needed.  . nitroGLYCERIN (NITROSTAT) 0.4 MG SL tablet Place 1 tablet (0.4 mg total) under the tongue every 5 (five) minutes x 3 doses as needed for chest pain.  . pantoprazole (PROTONIX) 40 MG tablet Take 1 tablet (40 mg total) by mouth daily.  . pregabalin (LYRICA) 50 MG capsule Take 50 mg by mouth 3 (three) times daily.  . ranitidine (ZANTAC) 300 MG tablet Take 300 mg by mouth daily.  [name] Spacer/Aero-Holding Chambers (OPTICHAMBER ADVANTAGE) MISC 1 each by Other  route once.  . ticagrelor (BRILINTA) 90 MG TABS tablet Take 1 tablet (90 mg total) by mouth 2 (two) times daily.  [name] warfarin (  COUMADIN) 2 MG tablet Take 3 mg by mouth at bedtime.    No current facility-administered medications on file prior to visit.     Review of Systems Per HPI unless specifically indicated above     Objective:    BP 111/61   Pulse 67   Temp 98.7 F (37.1 C) (Oral)   Resp 16   Ht 5\' 6"  (1.676 m)   Wt 205 lb 12.8 oz (93.4 kg)   BMI 33.22 kg/m   Wt Readings from Last 3 Encounters:  01/22/17 205 lb 12.8 oz (93.4 kg)  01/08/17 204 lb 12 oz (92.9 kg)  01/07/17 204 lb 9.6 oz (92.8 kg)    Physical Exam  Constitutional: [name] oriented to person, place, and time. He appears well-developed and well-nourished. No distress.  Well-appearing 83 year old elderly male, comfortable, cooperative, very pleasant, has cane  HENT:  Head: Normocephalic and atraumatic.  Mouth/Throat: Oropharynx is clear and moist.  Moist mucus mem and tongue  Eyes: Conjunctivae are normal. Right eye exhibits no discharge. Left eye exhibits no discharge.  Neck: Normal range of motion. Neck supple. No thyromegaly present.  Cardiovascular: Normal rate, regular rhythm, normal heart sounds and intact distal pulses.  No murmur heard. No ectopy  Pulmonary/Chest: Effort normal and breath sounds normal. No respiratory distress. He has no wheezes. He has no rales.  Good air movement. No focal abnormality.  Musculoskeletal: He exhibits no edema (Only trace non pitting edema of lower feet/ankles, minimal on exam).  Lymphadenopathy:    He has no cervical adenopathy.  Neurological: [name] alert and oriented to person, place, and time.  Skin: Skin is warm and dry. No rash noted. [name] not diaphoretic. No erythema.  Psychiatric: He has a normal mood and affect. His behavior is normal.  Well groomed, good eye contact, normal speech and thoughts  Nursing note and vitals reviewed.  Diabetic Foot Exam -  Simple   Simple Foot Form Diabetic Foot exam was performed with the following findings:  Yes 01/22/2017  3:45 PM  Visual Inspection No deformities, no ulcerations, no other skin breakdown bilaterally:  Yes Sensation Testing See comments:  Yes Pulse Check Posterior Tibialis and Dorsalis pulse intact bilaterally:  Yes Comments Significantly reduced bilateral foot monofilament testing plantar and dorsal surfaces, has intact light touch but only very minimal sensation to monofilament in distinct areas such as medial arch     Results for orders placed or performed during the hospital encounter of 01/08/17  Lipid Profile  Result Value Ref Range   Cholesterol 99 0 - 200 mg/dL   Triglycerides 118 <150 mg/dL   HDL 21 (L) >40 mg/dL   Total CHOL/HDL Ratio 4.7 RATIO   VLDL 24 0 - 40 mg/dL   LDL Cholesterol 54 0 - 99 mg/dL  CBC with Differential/Platelet  Result Value Ref Range   WBC 6.9 3.8 - 10.6 K/uL   RBC 3.66 (L) 4.40 - 5.90 MIL/uL   Hemoglobin 9.4 (L) 13.0 - 18.0 g/dL   HCT 29.2 (L) 40.0 - 52.0 %   MCV 79.6 (L) 80.0 - 100.0 fL   MCH 25.7 (L) 26.0 - 34.0 pg   MCHC 32.3 32.0 - 36.0 g/dL   RDW 20.3 (H) 11.5 - 14.5 %   Platelets 234 150 - 440 K/uL   Neutrophils Relative % 65 %   Neutro Abs 4.5 1.4 - 6.5 K/uL   Lymphocytes Relative 20 %   Lymphs Abs 1.3 1.0 - 3.6 K/uL  Monocytes Relative 11 %   Monocytes Absolute 0.8 0.2 - 1.0 K/uL   Eosinophils Relative 3 %   Eosinophils Absolute 0.2 0 - 0.7 K/uL   Basophils Relative 1 %   Basophils Absolute 0.1 0 - 0.1 K/uL  Basic metabolic panel  Result Value Ref Range   Sodium 135 135 - 145 mmol/L   Potassium 4.2 3.5 - 5.1 mmol/L   Chloride 106 101 - 111 mmol/L   CO2 21 (L) 22 - 32 mmol/L   Glucose, Bld 223 (H) 65 - 99 mg/dL   BUN 23 (H) 6 - 20 mg/dL   Creatinine, Ser 2.04 (H) 0.61 - 1.24 mg/dL   Calcium 9.3 8.9 - 10.3 mg/dL   GFR calc non Af Amer 29 (L) >60 mL/min   GFR calc Af Amer 33 (L) >60 mL/min   Anion gap 8 5 - 15        Assessment & Plan:   Problem List Items Addressed This Visit    CAD (coronary artery disease), native coronary artery    Chronic CAD without angina on imdur, considered stable angina, s/p stenting x 3, most recent 08/2016 On anticoagulation coumadin chronically PAF and now on anti platelet brillinta had allergy to plavix, per cards goal for 12 months - On BB, Statin, ACEi, Imdur control angina - Follow-up with [facility] Cardiology as planned 1 week      Chronic anticoagulation    Currently on chronic anticoagulation with coumadin and anti-platelet brillinta with s/p recent CAD stent 08/2016 - Per Cardiology recommend at least goal 6 months dual therapy and recommended to hold both and take ASA 81 x 5 days prior to EGD, anticipated in December - Follow Cards for Coumadin management INR      Chronic systolic CHF (congestive heart failure) (HCC)    Stable systolic CHF in setting of CAD, PAF See A&P Recent hospitalization DC'd Digoxin due to bradycardia Concern with CKD On Lasix 20mg  daily, not on concurrent K supplement due to CKD, has had stable K readings      CKD (chronic kidney disease), stage III (HCC) - Primary    Recent AoCKD elevated Cr dehydration, improved in hospital stay 10/3 to 10/5, back to baseline Cr near 2.0 - Saw Dr Sherri Rad in hospital and requests to follow-up with him as outpatient - Continue recently restarted ACEi Enalapril - Also continue acceptable renal dose Allopurinol 300mg  daily for gout, caution if AKI CrCl reduced < 20 would reduce dose, would like 2nd opinion from Nephrology as well - Concern with lasix in setting of CKD, await nephrology input  Patient to schedule hospital follow-up, and may need referral to establish care in future      CML (chronic myeloid leukemia) (HCC)    Stable, history of CML in remission per chart review Prior care at [facility], now transferred to local [facility] CC Dr [name] Follow-up outpatient this week      Diabetic neuropathy  associated with type 2 diabetes mellitus ([place])    Stable chronic DM neuropathy bilateral feet DM foot exam today reduced monofilament sensation Continue Lyrica, limited options      Essential hypertension    Stable controlled HTN Continue current regimen      GERD (gastroesophageal reflux disease)    Controlled Continue H2 blocker and PPI, consider switch PPI to AM dosing and H2 to PM Follow-up with GI      Iron deficiency anemia    Mild improved Hgb >9, near baseline 9-10, from  chart review Gradual improve on oral iron supplement 2-3 x daily, s/p IV iron prior Followed by [facility] Heme/Onc has outpatient follow-up      Paroxysmal atrial fibrillation (HCC)    Stable on rate control without RVR On chronic anticoagulation      Stable angina ([place])   Uncontrolled type 2 diabetes mellitus with nephropathy ([place])    Uncontrolled DM with A1c >10 last in [id number]/9774 Complications - hypoglycemia (rare), CKD-III, peripheral neuropathy, cataracts, CAD  Plan:  1. Discussed DM management concern with CKD and risk of hypoglycemia - ADJUST insulin to Lantus 50u (instead of 52) SWITCH to AM dosing after breakfast, not bedtime dosing to reduce early AM hypoglycemia - Check fasting AM CBG regularly now if < 150 can start gradual reduce Lantus dose 1u q 3-5 days - Future would consider alternative option such as GLP1 instead of Lantus if able to titrate down, as anticipate CKD extends duration of insulin - Counseling on risk of hypoglycemia and symptoms 2. Encourage improved lifestyle 3. Check CBG, bring log to next visit for review 4. Continue anticoag anti platelet, ACEi, Statin 5. DM Foot exam done today / will get DM eye record from Dr Ellin Mayhew (3 mo ago) 6. Follow-up 2 months for DM A1c, med adjust         No orders of the defined types were placed in this encounter.  Follow up plan: Return in about 2 months (around 03/24/2017) for DM A1c, CKD, GERD (f/u EGD).  Additionally completed  required [employer] [facility] HEDIS form, confirming on Lasix without K supplement and continuing Allopurinol for gout. To be faxed back to [facility].  [name], [facility] 01/23/2017, 12:20 AM

## 2017-01-23 ENCOUNTER — Inpatient Hospital Stay: Payer: Medicare HMO | Admitting: Oncology

## 2017-01-23 NOTE — Assessment & Plan Note (Addendum)
Stable, history of CML in remission per chart review Prior care at [facility], now transferred to local [place] Dr [name] Follow-up outpatient this week

## 2017-01-23 NOTE — Assessment & Plan Note (Signed)
Controlled Continue H2 blocker and PPI, consider switch PPI to AM dosing and H2 to PM Follow-up with GI

## 2017-01-23 NOTE — Assessment & Plan Note (Signed)
Recent AoCKD elevated Cr dehydration, improved in hospital stay 10/3 to 10/5, back to baseline Cr near 2.0 - Saw Dr Sherri Rad in hospital and requests to follow-up with him as outpatient - Continue recently restarted ACEi Enalapril - Also continue acceptable renal dose Allopurinol 300mg  daily for gout, caution if AKI CrCl reduced < 20 would reduce dose, would like 2nd opinion from Nephrology as well - Concern with lasix in setting of CKD, await nephrology input  Patient to schedule hospital follow-up, and may need referral to establish care in future

## 2017-01-23 NOTE — Assessment & Plan Note (Signed)
Chronic CAD without angina on imdur, considered stable angina, s/p stenting x 3, most recent 08/2016 On anticoagulation coumadin chronically PAF and now on anti platelet brillinta had allergy to plavix, per cards goal for 12 months - On BB, Statin, ACEi, Imdur control angina - Follow-up with [facility] Cardiology as planned 1 week

## 2017-01-23 NOTE — Assessment & Plan Note (Signed)
Currently on chronic anticoagulation with coumadin and anti-platelet brillinta with s/p recent CAD stent 08/2016 - Per Cardiology recommend at least goal 6 months dual therapy and recommended to hold both and take ASA 81 x 5 days prior to EGD, anticipated in December - Follow Cards for Coumadin management INR

## 2017-01-23 NOTE — Assessment & Plan Note (Signed)
Mild improved Hgb >9, near baseline 9-10, from chart review Gradual improve on oral iron supplement 2-3 x daily, s/p IV iron prior Followed by [place] has outpatient follow-up

## 2017-01-23 NOTE — Assessment & Plan Note (Signed)
Stable controlled HTN Continue current regimen

## 2017-01-23 NOTE — Assessment & Plan Note (Addendum)
Stable systolic CHF in setting of CAD, PAF See A&P Recent hospitalization DC'd Digoxin due to bradycardia Concern with CKD On Lasix 20mg  daily, not on concurrent K supplement due to CKD, has had stable K readings

## 2017-01-23 NOTE — Assessment & Plan Note (Signed)
Stable on rate control without RVR On chronic anticoagulation

## 2017-01-23 NOTE — Assessment & Plan Note (Signed)
Stable chronic DM neuropathy bilateral feet DM foot exam today reduced monofilament sensation Continue Lyrica, limited options

## 2017-01-23 NOTE — Assessment & Plan Note (Signed)
Uncontrolled DM with A1c >10 last in 96/1164 Complications - hypoglycemia (rare), CKD-III, peripheral neuropathy, cataracts, CAD  Plan:  1. Discussed DM management concern with CKD and risk of hypoglycemia - ADJUST insulin to Lantus 50u (instead of 52) SWITCH to AM dosing after breakfast, not bedtime dosing to reduce early AM hypoglycemia - Check fasting AM CBG regularly now if < 150 can start gradual reduce Lantus dose 1u q 3-5 days - Future would consider alternative option such as GLP1 instead of Lantus if able to titrate down, as anticipate CKD extends duration of insulin - Counseling on risk of hypoglycemia and symptoms 2. Encourage improved lifestyle 3. Check CBG, bring log to next visit for review 4. Continue anticoag anti platelet, ACEi, Statin 5. DM Foot exam done today / will get DM eye record from Dr Ellin Mayhew (3 mo ago) 6. Follow-up 2 months for DM A1c, med adjust

## 2017-01-26 NOTE — Progress Notes (Signed)
Lucas Caldwell  [phone] [phone]) [phone]  ID: SIGIFREDO PIGNATO OB: 04-Oct-1933  MR#: [id number]  QHU#:[id number]  Patient Care Team: [name], DO as PCP - General (Family Medicine)  CHIEF COMPLAINT: CML  INTERVAL HISTORY: Patient returns to clinic today for hospital follow-up and to establish care.  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  He was initially placed on Gleevec upon diagnosis, but could not tolerate this and  treatment was switched.  He currently feels well and at his baseline. He has no neurologic complaints. He denies any fevers. He does not complain of weakness and fatigue. He denies any chest pain, shortness of breath, or cough. He denies any nausea, vomiting, constipation, or diarrhea. He has no urinary complaints. Patient offers no specific complaints today.  REVIEW OF SYSTEMS:   Review of Systems  Constitutional: Negative.  Negative for fever, malaise/fatigue and weight loss.  Respiratory: Negative.  Negative for cough and shortness of breath.   Cardiovascular: Negative.  Negative for chest pain and leg swelling.  Gastrointestinal: Negative.  Negative for abdominal pain.  Genitourinary: Negative.   Musculoskeletal: Negative.   Skin: Negative.   Neurological: Negative.  Negative for sensory change and weakness.  Psychiatric/Behavioral: Negative.  The patient is not nervous/anxious.     As per HPI. Otherwise, a complete review of systems is negative.  PAST MEDICAL HISTORY: Past Medical History:  Diagnosis Date  . Arthritis    "probably in his legs" (08/15/2016)  . Asthma   . Blind right eye   . BPH (benign prostatic hyperplasia)   . Breast asymmetry    Left breast is larger, present for several years.  [name] CAD (coronary artery disease)    a. Cath in the late 90's - reportedly ok;  b. 2014 s/p stenting x 2 @ [facility]; c 08/19/16 Cath/PCI with DES -> RCA, plan to  treat LM 70% medically. Seen by surgery and felt to be too high risk for CABG.  . Chronic combined systolic (congestive) and diastolic (congestive) heart failure ([place])    a. Previously reduced EF-->50% by echo in 2012;  b. 06/2015 Echo: EF 50-55%  c. 07/2016 Echo: EF 45-50%.  . CKD (chronic kidney disease), stage III ([place])   . CML (chronic myelocytic leukemia) ([place])   . GERD (gastroesophageal reflux disease)   . Gout   . Hyperlipemia   . Hypertension   . Hypothyroidism   . Iron deficiency anemia   . Migraine    "in the 1960s" (08/15/2016)  . NICM (nonischemic cardiomyopathy) ([place])    a. Previously reduced EF-->50% by echo in 2012;  b. 06/2015 Echo: EF 50-55%, Gr2 DD, mild MR, mildly dil LA, Ao sclerosis, mild TR;  c. 07/2016 Echo: EF 45-50%, ? inf HK, mild MR, mildly dil LA.  [name] PAF (paroxysmal atrial fibrillation) ([facility])    a. ? Dx 2014-->s/p DCCV;  b. CHA2DS2VASc = 6-->chronic coumadin.  . Prostate cancer ([place])   . RBBB   . Type II diabetes mellitus ([place])     PAST SURGICAL HISTORY: Past Surgical History:  Procedure Laterality Date  . CATARACT EXTRACTION W/ INTRAOCULAR LENS  IMPLANT, BILATERAL Bilateral   . CORONARY ANGIOPLASTY WITH STENT PLACEMENT  09/2012   2 stents  . HERNIA REPAIR     "navel"  . TOE AMPUTATION Right    "big toe"    FAMILY HISTORY: Family History  Problem Relation Age of Onset  . Brain  cancer Father   . Diabetes Sister   . Heart attack Sister     ADVANCED DIRECTIVES (Y/N):  N  HEALTH MAINTENANCE: Social History   Tobacco Use  . Smoking status: Former Smoker    Packs/day: 2.00    Years: 15.00    Pack years: 30.00    Types: Cigarettes  . Smokeless tobacco: Former Systems developer  . Tobacco comment: quit 1979  Substance Use Topics  . Alcohol use: No    Comment: previously drank heavily - quit 1979.  . Drug use: No     Colonoscopy:  PAP:  Bone density:  Lipid panel:  Allergies  Allergen Reactions  . Clopidogrel Anaphylaxis    Other reaction(s):  ANAPHYLAXIS;altered mental status;  (electrolytes "out of wack" and confusion per family; THEY DO NOT REMEMBER ANY OTHER REACTION)- MSB 10/10/15  . Ciprofloxacin Itching  . Spironolactone     Other reaction(s): Other (See Comments) gynecomastia  . Plavix [Clopidogrel Bisulfate] Anxiety    Current Outpatient Medications  Medication Sig Dispense Refill  . acetaminophen (TYLENOL) 500 MG tablet Take 500 mg by mouth every 6 (six) hours as needed.    [name] albuterol (PROVENTIL HFA;VENTOLIN HFA) 108 (90 BASE) MCG/ACT inhaler Inhale 2 puffs into the lungs every 6 (six) hours as needed for wheezing or shortness of breath. 1 Inhaler 2  . allopurinol (ZYLOPRIM) 300 MG tablet Take 300 mg by mouth daily.   12  . atorvastatin (LIPITOR) 80 MG tablet Take 1 tablet (80 mg total) by mouth daily at 6 PM. 90 tablet 1  . bosutinib (BOSULIF) 100 MG tablet Take 200 mg by mouth at bedtime. Take with food.     . enalapril (VASOTEC) 5 MG tablet Take 5 mg by mouth daily.    . famotidine (PEPCID) 20 MG tablet Take 20 mg by mouth at bedtime as needed for heartburn. Take 2 hours after chemo pill.    . fenofibrate 160 MG tablet Take 1 tablet (160 mg total) by mouth daily. 30 tablet 3  . ferrous sulfate 325 (65 FE) MG EC tablet Take 1 tablet (325 mg total) by mouth 3 (three) times daily with meals. 270 tablet 3  . furosemide (LASIX) 20 MG tablet Take 20 mg by mouth daily.    . hydrocortisone 2.5 % ointment Apply topically 3 (three) times daily. 30 g 0  . isosorbide mononitrate (IMDUR) 30 MG 24 hr tablet Take 1 tablet (30 mg total) by mouth daily. 30 tablet 1  . LANTUS SOLOSTAR 100 UNIT/ML Solostar Pen Inject 20 Units into the skin at bedtime. (Patient taking differently: Inject 65 Units into the skin at bedtime. ) 15 mL 0  . levothyroxine (SYNTHROID, LEVOTHROID) 25 MCG tablet Take 25 mcg by mouth daily before breakfast.   12  . loperamide (IMODIUM A-D) 2 MG tablet Take 2 mg by mouth 3 (three) times daily as needed for diarrhea  or loose stools.     . magnesium oxide (MAG-OX) 400 MG tablet Take 400 mg by mouth 2 (two) times daily.    . Magnesium Oxide 400 (240 Mg) MG TABS Take 1 tablet by mouth 2 (two) times daily.  3  . metoprolol succinate (TOPROL-XL) 50 MG 24 hr tablet Take 50 mg by mouth every morning.  12  . Multiple Vitamins-Minerals (MULTIVITAMIN ADULTS 50+ PO) Take 1 tablet by mouth every morning.    . nepafenac (NEVANAC) 0.1 % ophthalmic suspension Place 1 drop into both eyes 3 (three) times daily as needed.    [name]  nitroGLYCERIN (NITROSTAT) 0.4 MG SL tablet Place 1 tablet (0.4 mg total) under the tongue every 5 (five) minutes x 3 doses as needed for chest pain. 75 tablet 2  . pantoprazole (PROTONIX) 40 MG tablet Take 1 tablet (40 mg total) by mouth daily. 30 tablet 0  . pregabalin (LYRICA) 50 MG capsule Take 50 mg by mouth 3 (three) times daily.    . ranitidine (ZANTAC) 300 MG tablet Take 300 mg by mouth daily.    [name] Spacer/Aero-Holding Chambers (OPTICHAMBER ADVANTAGE) MISC 1 each by Other route once. 1 each 0  . ticagrelor (BRILINTA) 90 MG TABS tablet Take 1 tablet (90 mg total) by mouth 2 (two) times daily. 180 tablet 3  . warfarin (COUMADIN) 2 MG tablet Take 3 mg by mouth at bedtime.      No current facility-administered medications for this visit.     OBJECTIVE: Vitals:   01/27/17 1426  BP: 112/63  Pulse: 83  Resp: 18  Temp: 97.6 F (36.4 C)     Body mass index is 33.16 kg/m.    ECOG FS:0 - Asymptomatic  General: Well-developed, well-nourished, no acute distress. Eyes: Pink conjunctiva, anicteric sclera. HEENT: Normocephalic, moist mucous membranes, clear oropharnyx. Lungs: Clear to auscultation bilaterally. Heart: Regular rate and rhythm. No rubs, murmurs, or gallops. Abdomen: Soft, nontender, nondistended. No organomegaly noted, normoactive bowel sounds. Musculoskeletal: No edema, cyanosis, or clubbing. Neuro: Alert, answering all questions appropriately. Cranial nerves grossly intact. Skin:  No rashes or petechiae noted. Psych: Normal affect. Lymphatics: No cervical, calvicular, axillary or inguinal LAD.   LAB RESULTS:  Lab Results  Component Value Date   NA 134 (L) 01/27/2017   K 4.1 01/27/2017   CL 101 01/27/2017   CO2 21 (L) 01/27/2017   GLUCOSE 246 (H) 01/27/2017   BUN 31 (H) 01/27/2017   CREATININE 2.02 (H) 01/27/2017   CALCIUM 9.5 01/27/2017   PROT 8.1 01/27/2017   ALBUMIN 3.8 01/27/2017   AST 52 (H) 01/27/2017   ALT 40 01/27/2017   ALKPHOS 70 01/27/2017   BILITOT 0.4 01/27/2017   GFRNONAA 29 (L) 01/27/2017   GFRAA 34 (L) 01/27/2017    Lab Results  Component Value Date   WBC 9.4 01/27/2017   NEUTROABS 6.2 01/27/2017   HGB 10.7 (L) 01/27/2017   HCT 33.2 (L) 01/27/2017   MCV 80.5 01/27/2017   PLT 280 01/27/2017     STUDIES: No results found.  ASSESSMENT: CML  PLAN:    1. CML: Patient has previously received all of his care at [facility]  Upon diagnosis, he was placed on Gleevec, but was then transitioned to Bosulif secondary to side effects. By report, his last BCR-ABL indicated a complete remission. Will repeat BCR-ABL today. No intervention is needed at this time.  Return to clinic in 3 months with repeat laboratory work and further evaluation.   2. Anemia: Mild.  Monitor. 3.  Chronic renal insufficiency: Patient's creatinine was 2.0.  Unclear his baseline.  Monitor.  Approximately 30 minutes was spent in discussion of which greater than 50% was consultation.  Patient expressed understanding and was in agreement with this plan. He also understands that He can call clinic at any time with any questions, concerns, or complaints.    Lloyd Huger, MD   01/28/2017 10:57 AM

## 2017-01-27 ENCOUNTER — Inpatient Hospital Stay: Payer: Medicare HMO

## 2017-01-27 ENCOUNTER — Other Ambulatory Visit: Payer: Self-pay

## 2017-01-27 ENCOUNTER — Inpatient Hospital Stay: Payer: Medicare HMO | Attending: Oncology | Admitting: Oncology

## 2017-01-27 ENCOUNTER — Encounter: Payer: Self-pay | Admitting: Oncology

## 2017-01-27 VITALS — BP 112/63 | HR 83 | Temp 97.6°F | Resp 18 | Wt 205.4 lb

## 2017-01-27 DIAGNOSIS — I480 Paroxysmal atrial fibrillation: Secondary | ICD-10-CM | POA: Diagnosis not present

## 2017-01-27 DIAGNOSIS — N400 Enlarged prostate without lower urinary tract symptoms: Secondary | ICD-10-CM | POA: Diagnosis not present

## 2017-01-27 DIAGNOSIS — K219 Gastro-esophageal reflux disease without esophagitis: Secondary | ICD-10-CM | POA: Insufficient documentation

## 2017-01-27 DIAGNOSIS — M109 Gout, unspecified: Secondary | ICD-10-CM | POA: Insufficient documentation

## 2017-01-27 DIAGNOSIS — Z79899 Other long term (current) drug therapy: Secondary | ICD-10-CM | POA: Diagnosis not present

## 2017-01-27 DIAGNOSIS — I2510 Atherosclerotic heart disease of native coronary artery without angina pectoris: Secondary | ICD-10-CM | POA: Insufficient documentation

## 2017-01-27 DIAGNOSIS — I5042 Chronic combined systolic (congestive) and diastolic (congestive) heart failure: Secondary | ICD-10-CM | POA: Insufficient documentation

## 2017-01-27 DIAGNOSIS — C9210 Chronic myeloid leukemia, BCR/ABL-positive, not having achieved remission: Secondary | ICD-10-CM | POA: Diagnosis not present

## 2017-01-27 DIAGNOSIS — E785 Hyperlipidemia, unspecified: Secondary | ICD-10-CM | POA: Insufficient documentation

## 2017-01-27 DIAGNOSIS — I4510 Unspecified right bundle-branch block: Secondary | ICD-10-CM | POA: Insufficient documentation

## 2017-01-27 DIAGNOSIS — I130 Hypertensive heart and chronic kidney disease with heart failure and stage 1 through stage 4 chronic kidney disease, or unspecified chronic kidney disease: Secondary | ICD-10-CM | POA: Diagnosis not present

## 2017-01-27 DIAGNOSIS — N183 Chronic kidney disease, stage 3 (moderate): Secondary | ICD-10-CM

## 2017-01-27 DIAGNOSIS — Z87891 Personal history of nicotine dependence: Secondary | ICD-10-CM | POA: Insufficient documentation

## 2017-01-27 DIAGNOSIS — Z8546 Personal history of malignant neoplasm of prostate: Secondary | ICD-10-CM | POA: Diagnosis not present

## 2017-01-27 DIAGNOSIS — E039 Hypothyroidism, unspecified: Secondary | ICD-10-CM | POA: Insufficient documentation

## 2017-01-27 DIAGNOSIS — H5461 Unqualified visual loss, right eye, normal vision left eye: Secondary | ICD-10-CM | POA: Diagnosis not present

## 2017-01-27 DIAGNOSIS — Z955 Presence of coronary angioplasty implant and graft: Secondary | ICD-10-CM | POA: Diagnosis not present

## 2017-01-27 DIAGNOSIS — Z794 Long term (current) use of insulin: Secondary | ICD-10-CM | POA: Diagnosis not present

## 2017-01-27 DIAGNOSIS — Z9841 Cataract extraction status, right eye: Secondary | ICD-10-CM | POA: Insufficient documentation

## 2017-01-27 DIAGNOSIS — E1122 Type 2 diabetes mellitus with diabetic chronic kidney disease: Secondary | ICD-10-CM | POA: Diagnosis not present

## 2017-01-27 DIAGNOSIS — D649 Anemia, unspecified: Secondary | ICD-10-CM | POA: Diagnosis not present

## 2017-01-27 DIAGNOSIS — Z7901 Long term (current) use of anticoagulants: Secondary | ICD-10-CM | POA: Diagnosis not present

## 2017-01-27 DIAGNOSIS — J45909 Unspecified asthma, uncomplicated: Secondary | ICD-10-CM | POA: Insufficient documentation

## 2017-01-27 LAB — CBC WITH DIFFERENTIAL/PLATELET
Basophils Absolute: 0.1 K/uL (ref 0–0.1)
Basophils Relative: 1 %
Eosinophils Absolute: 0.4 K/uL (ref 0–0.7)
Eosinophils Relative: 5 %
HCT: 33.2 % — ABNORMAL LOW (ref 40.0–52.0)
Hemoglobin: 10.7 g/dL — ABNORMAL LOW (ref 13.0–18.0)
Lymphocytes Relative: 19 %
Lymphs Abs: 1.8 K/uL (ref 1.0–3.6)
MCH: 25.9 pg — ABNORMAL LOW (ref 26.0–34.0)
MCHC: 32.2 g/dL (ref 32.0–36.0)
MCV: 80.5 fL (ref 80.0–100.0)
Monocytes Absolute: 0.9 K/uL (ref 0.2–1.0)
Monocytes Relative: 10 %
Neutro Abs: 6.2 K/uL (ref 1.4–6.5)
Neutrophils Relative %: 65 %
Platelets: 280 K/uL (ref 150–440)
RBC: 4.13 MIL/uL — ABNORMAL LOW (ref 4.40–5.90)
RDW: 19.9 % — ABNORMAL HIGH (ref 11.5–14.5)
WBC: 9.4 K/uL (ref 3.8–10.6)

## 2017-01-27 LAB — COMPREHENSIVE METABOLIC PANEL WITH GFR
ALT: 40 U/L (ref 17–63)
AST: 52 U/L — ABNORMAL HIGH (ref 15–41)
Albumin: 3.8 g/dL (ref 3.5–5.0)
Alkaline Phosphatase: 70 U/L (ref 38–126)
Anion gap: 12 (ref 5–15)
BUN: 31 mg/dL — ABNORMAL HIGH (ref 6–20)
CO2: 21 mmol/L — ABNORMAL LOW (ref 22–32)
Calcium: 9.5 mg/dL (ref 8.9–10.3)
Chloride: 101 mmol/L (ref 101–111)
Creatinine, Ser: 2.02 mg/dL — ABNORMAL HIGH (ref 0.61–1.24)
GFR calc Af Amer: 34 mL/min — ABNORMAL LOW (ref >60)
GFR calc non Af Amer: 29 mL/min — ABNORMAL LOW (ref >60)
Glucose, Bld: 246 mg/dL — ABNORMAL HIGH (ref 65–99)
Potassium: 4.1 mmol/L (ref 3.5–5.1)
Sodium: 134 mmol/L — ABNORMAL LOW (ref 135–145)
Total Bilirubin: 0.4 mg/dL (ref 0.3–1.2)
Total Protein: 8.1 g/dL (ref 6.5–8.1)

## 2017-01-27 LAB — PHOSPHORUS: PHOSPHORUS: 2.8 mg/dL (ref 2.5–4.6)

## 2017-01-27 LAB — MAGNESIUM: MAGNESIUM: 2.2 mg/dL (ref 1.7–2.4)

## 2017-01-27 NOTE — Progress Notes (Signed)
In today as new patient/follow up from hospital discharge.  Pt reports feeling well, denies any concerns or difficulties.  Wife with pt today.

## 2017-01-29 ENCOUNTER — Ambulatory Visit: Payer: Medicare HMO | Admitting: Internal Medicine

## 2017-01-30 ENCOUNTER — Ambulatory Visit: Admit: 2017-01-30 | Payer: Medicare HMO | Admitting: Gastroenterology

## 2017-01-30 SURGERY — ESOPHAGOGASTRODUODENOSCOPY (EGD) WITH PROPOFOL
Anesthesia: General

## 2017-02-03 DIAGNOSIS — N183 Chronic kidney disease, stage 3 (moderate): Secondary | ICD-10-CM | POA: Diagnosis not present

## 2017-02-03 DIAGNOSIS — E1129 Type 2 diabetes mellitus with other diabetic kidney complication: Secondary | ICD-10-CM | POA: Diagnosis not present

## 2017-02-03 DIAGNOSIS — I10 Essential (primary) hypertension: Secondary | ICD-10-CM | POA: Diagnosis not present

## 2017-02-03 LAB — BCR-ABL1, CML/ALL, PCR, QUANT

## 2017-02-04 NOTE — Unmapped (Signed)
[facility] Refill Coordination Note  Specialty Medication(s): Bosulif 100mg       Ivan Pierce, DOB: 14-Dec-1933  Phone: [phone] (home) , Alternate phone contact: N/A  Phone or address changes today?: No  All above HIPAA information was verified with patient.  Shipping Address: [address]  [place] [address]   Insurance changes? No    Completed refill call assessment today to schedule patient's medication shipment from the [facility] [phone]).      Confirmed the medication and dosage are correct and have not changed: Yes, regimen is correct and unchanged.    Confirmed patient started or stopped the following medications in the past month:  No, there are no changes reported at this time.    Are you tolerating your medication?:  Ivan Pierce reports tolerating the medication.    ADHERENCE    Is this medicine transplant or covered by Medicare Part B? No.        Did you miss any doses in the past 4 weeks? No missed doses reported.    FINANCIAL/SHIPPING    Delivery Scheduled: Yes, Expected medication delivery date: 02/11/17     Ivan Pierce did not have any additional questions at this time.    Delivery address validated in [facility] scheduling system: Yes, address listed in [facility]SI is correct.    We will follow up with patient monthly for standard refill processing and delivery.      Thank you,  [employer]   [facility] Shared [facility] Specialty Pharmacist

## 2017-02-04 NOTE — Unmapped (Addendum)
Discussed overdue diabetic eye exam with patient. Patient declined

## 2017-02-08 MED FILL — BOSULIF/100MG/TABS: BOSULIF/100MG/TABS | 30 days supply | Qty: 60 | Fill #4

## 2017-02-10 ENCOUNTER — Telehealth: Payer: Self-pay | Admitting: Oncology

## 2017-02-10 NOTE — Telephone Encounter (Signed)
Oral Oncology Patient Advocate Encounter   Trying to get his Bosutinib moved to [facility]. Tried to get it done this month but it was all ready processed by other pharmacy. Will try next month.  [place] Patient Advocate [phone] 02/10/2017 10:26 AM

## 2017-02-26 ENCOUNTER — Telehealth: Payer: Self-pay

## 2017-02-26 ENCOUNTER — Ambulatory Visit (INDEPENDENT_AMBULATORY_CARE_PROVIDER_SITE_OTHER): Payer: Medicare HMO

## 2017-02-26 ENCOUNTER — Other Ambulatory Visit: Payer: Self-pay

## 2017-02-26 DIAGNOSIS — Z7901 Long term (current) use of anticoagulants: Secondary | ICD-10-CM

## 2017-02-26 DIAGNOSIS — I480 Paroxysmal atrial fibrillation: Secondary | ICD-10-CM

## 2017-02-26 LAB — POCT INR: INR: 2.1

## 2017-02-26 NOTE — Patient Instructions (Signed)
Please continue taking 1.5 tablets daily.  Recheck INR in 6 weeks.

## 2017-02-26 NOTE — Telephone Encounter (Signed)
Patients wife said Lucas Caldwell does not need any medication refills on either medication at this time.

## 2017-02-26 NOTE — Telephone Encounter (Signed)
Contacted patient to follow up on a refill request that was received a month ago.   Wife said her husband did not need any medication refills at this time.  Thanks [employer]

## 2017-03-04 NOTE — Unmapped (Signed)
[facility] Refill Coordination Note  Specialty Medication(s): Bosulif 100mg       Ivan Pierce, DOB: October 17, 1933  Phone: [phone] (home) , Alternate phone contact: N/A  Phone or address changes today?: No  All above HIPAA information was verified with patient.  Shipping Address: [address]  [place] [address]   Insurance changes? No    Completed refill call assessment today to schedule patient's medication shipment from the [facility] [phone]).      Confirmed the medication and dosage are correct and have not changed: Yes, regimen is correct and unchanged.    Confirmed patient started or stopped the following medications in the past month:  No, there are no changes reported at this time.    Are you tolerating your medication?:  [place] reports tolerating the medication.    ADHERENCE    Is this medicine transplant or covered by Medicare Part B? No.        Did you miss any doses in the past 4 weeks? No missed doses reported.    FINANCIAL/SHIPPING    Delivery Scheduled: Yes, Expected medication delivery date: 03/07/17     [name] did not have any additional questions at this time.    Delivery address validated in [facility] scheduling system: Yes, address listed in [facility]SI is correct.    We will follow up with patient monthly for standard refill processing and delivery.      Thank you,  [employer]   [facility] Shared [facility] Specialty Pharmacist

## 2017-03-04 NOTE — Progress Notes (Signed)
Follow-up Outpatient Visit Date: 03/05/2017  Primary Care Provider: [name], DO [address] [address]  Chief Complaint: Shortness of breath  HPI:  Lucas Caldwell is a 83 y.o. year-old male with history of  coronary artery disease, mixed ischemic and nonischemic cardiomyopathy, hypertension, hyperlipidemia, type 2 diabetes mellitus, paroxysmal atrial fibrillation, chronic kidney disease stage III, CML, iron deficiency anemia, BPH, and hypothyroidism, who presents for follow-up of coronary artery diease and cardiomyopathy. I last saw him in October following admission for dysphasia and acute on chronic anemia. EGD showed a Schatzki's ring and gastric erosion with stigmata of recent bleeding. Esophageal dilation was planned for mid November but was pushed back in order to complete at least 6 months of wafarin and ticagrelor following PCI to the RCA on 08/14/16.  Today, [name] complains of shortness of breath over the last 3 months. He believes that it began after he started taking ferrous sulfate. He has exertional dyspnea with mild activity and sometimes has shortness of breath at rest. His dyspnea is sporadic, however. He has noted a 10 pound weight gain since his last visit. He feet and calves have also been swollen.  [name] notes a single episode of chest pain ~1 week ago. It came and went over the course of several seconds. It was an ache along both sides of his lower ribs and was accompanied by some shortness of breath. He denies palpitations and lightheadedness but has felt a bit off balance. He continues to have some dysphagia, and feels gassy after eating certain foods that are difficult for him to chew. He denies rectal bleeding but has noted very dark stools. [name] uncertain if this is due to iron or bleeding.  --------------------------------------------------------------------------------------------------  Past Medical History:  Diagnosis Date  .  Arthritis    "probably in his legs" (08/15/2016)  . Asthma   . Blind right eye   . BPH (benign prostatic hyperplasia)   . Breast asymmetry    Left breast is larger, present for several years.  [name] CAD (coronary artery disease)    a. Cath in the late 90's - reportedly ok;  b. 2014 s/p stenting x 2 @ [facility]; c 08/19/16 Cath/PCI with DES -> RCA, plan to treat LM 70% medically. Seen by surgery and felt to be too high risk for CABG.  . Chronic combined systolic (congestive) and diastolic (congestive) heart failure ([place])    a. Previously reduced EF-->50% by echo in 2012;  b. 06/2015 Echo: EF 50-55%  c. 07/2016 Echo: EF 45-50%.  . CKD (chronic kidney disease), stage III ([place])   . CML (chronic myelocytic leukemia) ([place])   . GERD (gastroesophageal reflux disease)   . Gout   . Hyperlipemia   . Hypertension   . Hypothyroidism   . Iron deficiency anemia   . Migraine    "in the 1960s" (08/15/2016)  . NICM (nonischemic cardiomyopathy) ([place])    a. Previously reduced EF-->50% by echo in 2012;  b. 06/2015 Echo: EF 50-55%, Gr2 DD, mild MR, mildly dil LA, Ao sclerosis, mild TR;  c. 07/2016 Echo: EF 45-50%, ? inf HK, mild MR, mildly dil LA.  [name] PAF (paroxysmal atrial fibrillation) ([facility])    a. ? Dx 2014-->s/p DCCV;  b. CHA2DS2VASc = 6-->chronic coumadin.  . Prostate cancer ([place])   . RBBB   . Type II diabetes mellitus ([place])    Past Surgical History:  Procedure Laterality Date  . CATARACT EXTRACTION W/ INTRAOCULAR LENS  IMPLANT, BILATERAL Bilateral   . CORONARY ANGIOPLASTY WITH STENT PLACEMENT  09/2012   2 stents  . CORONARY STENT INTERVENTION N/A 08/19/2016   Procedure: Coronary Stent Intervention;  Surgeon: [name], MD;  Location: [facility];  Service: Cardiovascular;  Laterality: N/A;  . ESOPHAGOGASTRODUODENOSCOPY N/A 12/20/2016   Procedure: ESOPHAGOGASTRODUODENOSCOPY (EGD);  Surgeon: Lin Landsman, MD;  Location[facility] ENDOSCOPY;  Service: Gastroenterology;  Laterality: N/A;  . HERNIA REPAIR      "navel"  . LEFT HEART CATH AND CORONARY ANGIOGRAPHY N/A 08/14/2016   Procedure: Left Heart Cath and Coronary Angiography;  Surgeon: [name], MD;  Location: [facility];  Service: Cardiovascular;  Laterality: N/A;  . TOE AMPUTATION Right    "big toe"   Current Meds  Medication Sig  . acetaminophen (TYLENOL) 500 MG tablet Take 500 mg by mouth every 6 (six) hours as needed.  [name] albuterol (PROVENTIL HFA;VENTOLIN HFA) 108 (90 BASE) MCG/ACT inhaler Inhale 2 puffs into the lungs every 6 (six) hours as needed for wheezing or shortness of breath.  . allopurinol (ZYLOPRIM) 100 MG tablet Take 100 mg by mouth daily.  [name] atorvastatin (LIPITOR) 80 MG tablet Take 1 tablet (80 mg total) by mouth daily at 6 PM.  . bosutinib (BOSULIF) 100 MG tablet Take 200 mg by mouth at bedtime. Take with food.   . enalapril (VASOTEC) 5 MG tablet Take 5 mg by mouth daily.  . famotidine (PEPCID) 20 MG tablet Take 20 mg by mouth at bedtime as needed for heartburn. Take 2 hours after chemo pill.  . fenofibrate 160 MG tablet Take 1 tablet (160 mg total) by mouth daily.  . ferrous sulfate 325 (65 FE) MG EC tablet Take 1 tablet (325 mg total) by mouth 3 (three) times daily with meals.  . hydrocortisone 2.5 % ointment Apply topically 3 (three) times daily.  . isosorbide mononitrate (IMDUR) 30 MG 24 hr tablet Take 1 tablet (30 mg total) by mouth daily.  [name] LANTUS SOLOSTAR 100 UNIT/ML Solostar Pen Inject 20 Units into the skin at bedtime. (Patient taking differently: Inject 65 Units into the skin at bedtime. )  . levothyroxine (SYNTHROID, LEVOTHROID) 25 MCG tablet Take 25 mcg by mouth daily before breakfast.   . loperamide (IMODIUM A-D) 2 MG tablet Take 2 mg by mouth 3 (three) times daily as needed for diarrhea or loose stools.   . magnesium oxide (MAG-OX) 400 MG tablet Take 400 mg by mouth 2 (two) times daily.  . Magnesium Oxide 400 (240 Mg) MG TABS Take 1 tablet by mouth 2 (two) times daily.  . metoprolol  succinate (TOPROL-XL) 50 MG 24 hr tablet Take 50 mg by mouth every morning.  . Multiple Vitamins-Minerals (MULTIVITAMIN ADULTS 50+ PO) Take 1 tablet by mouth every morning.  . nepafenac (NEVANAC) 0.1 % ophthalmic suspension Place 1 drop into both eyes 3 (three) times daily as needed.  . nitroGLYCERIN (NITROSTAT) 0.4 MG SL tablet Place 1 tablet (0.4 mg total) under the tongue every 5 (five) minutes x 3 doses as needed for chest pain.  . pantoprazole (PROTONIX) 40 MG tablet Take 1 tablet (40 mg total) by mouth daily.  . pregabalin (LYRICA) 50 MG capsule Take 50 mg by mouth 3 (three) times daily.  . ranitidine (ZANTAC) 300 MG tablet Take 300 mg by mouth daily.  [name] Spacer/Aero-Holding Chambers (OPTICHAMBER ADVANTAGE) MISC 1 each by Other route once.  . ticagrelor (BRILINTA) 90 MG TABS tablet Take 1 tablet (90 mg total) by  mouth 2 (two) times daily.  [name] warfarin (COUMADIN) 2 MG tablet Take 3 mg by mouth at bedtime.   . [DISCONTINUED] furosemide (LASIX) 20 MG tablet Take 20 mg by mouth daily.    Allergies: Clopidogrel; Ciprofloxacin; Spironolactone; and Plavix [clopidogrel bisulfate]  Social History   Socioeconomic History  . Marital status: Married    Spouse name: Not on file  . Number of children: Not on file  . Years of education: Not on file  . Highest education level: Not on file  Social Needs  . Financial resource strain: Not on file  . Food insecurity - worry: Not on file  . Food insecurity - inability: Not on file  . Transportation needs - medical: Not on file  . Transportation needs - non-medical: Not on file  Occupational History  . 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  Tobacco Use  . Smoking status: Former Smoker    Packs/day: 2.00    Years: 15.00    Pack years: 30.00    Types: Cigarettes  . Smokeless tobacco: Former Systems developer  . Tobacco comment: quit 1979  Substance and Sexual Activity  . Alcohol use: No    Comment: previously drank heavily - quit  1979.  . Drug use: No  . Sexual activity: No  Other Topics Concern  . Not on file  Social History Narrative  . Not on file    Family History  Problem Relation Age of Onset  . Brain cancer Father   . Diabetes Sister   . Heart attack Sister     Review of Systems: A 12-system review of systems was performed and was negative except as noted in the HPI.  --------------------------------------------------------------------------------------------------  Physical Exam: BP 120/60 (BP Location: Left Arm, Patient Position: Sitting, Cuff Size: Large)   Pulse 65   Ht 5\' 4"  (1.626 m)   Wt 208 lb (94.3 kg)   BMI 35.70 kg/m   General:  Obese man, seated comfortably in the exam room. [name] accompanied by his wife. HEENT: No conjunctival pallor or scleral icterus. Moist mucous membranes.  OP clear. Neck: Supple without lymphadenopathy, thyromegaly, JVD, or HJR, though evaluation is limited by body habitus. Lungs: Normal work of breathing. Mildly diminished breath sounds throughout without wheezes or crackles. Heart: Regular rate and rhythm without murmurs, rubs, or gallops. Unable to assess PMI due to body habitus. Abd: Bowel sounds present. Abdomen is firm and mildly distended. Unable to assess HSM due to body habitus. Ext: 1+ pretibial edema to proximal calves. Skin: Warm and dry without rash.  EKG:  NSR with RBBB and LAFB (bifasicular block). No significant change from prior tracing on 01/08/17.  Lab Results  Component Value Date   WBC 6.8 03/05/2017   HGB 10.9 (L) 03/05/2017   HCT 33.3 (L) 03/05/2017   MCV 82.6 03/05/2017   PLT 219 03/05/2017    Lab Results  Component Value Date   NA 134 (L) 03/05/2017   K 4.1 03/05/2017   CL 103 03/05/2017   CO2 23 03/05/2017   BUN 26 (H) 03/05/2017   CREATININE 1.86 (H) 03/05/2017   GLUCOSE 301 (H) 03/05/2017   ALT 40 01/27/2017    Lab Results  Component Value Date   CHOL 99 01/08/2017   HDL 21 (L) 01/08/2017   LDLCALC 54  01/08/2017   TRIG 118 01/08/2017   CHOLHDL 4.7 01/08/2017    --------------------------------------------------------------------------------------------------  ASSESSMENT AND PLAN: Coronary artery disease with stable angina Chest pain has  been fairly infrequent and atypical. He remains on warfarin and ticagrelor following PCI to the RCA in late May. He has moderate, non-critical LMCA disease; I am not convinced that this is the primary cause of his symptoms. I will continue metoprolol and isosorbide mononitrate for antianginal therapy and focus on treatment of heart failure, as below. If his symptoms do not improve, we may need to consider repeat catheterization and potential PCI to unprotected LMCA. He was previously turned down for CABG.  Chronic diastolic heart failure Mr. Mcmackin describes worsening shortness of breath accompanied by edema and weight gain over the last 3 months. Symptoms are even intermittently present at rest, consistent with NYHA class III-IV HF. EF was only mildly reduced on echo in May. Though diastolic function was read as normal, I question the accuracy given underlying cardiomyopathy. We will increase furosemide from 20 mg daily to 40 mg BID x 2 days, followed by 40 mg daily thereafter. I will check a BMP today and have him return in 2 weeks to reassess his symptoms and volume status. BMP should be repeated at that time.  Iron deficiency anemia Given increasing dyspnea, I will recheck a CBC today to ensure appropriate response to iron therapy. We will continue with warfarin and clopidogrel, given PCI in May.  Hypertension BP well-controlled.  Hyperlipidemia LDL 54 in October. Continue atorvastatin 80 mg daily.  Follow-up: Return to clinic in 2 weeks.  [name], MD 03/06/2017 7:21 PM

## 2017-03-05 ENCOUNTER — Other Ambulatory Visit
Admission: RE | Admit: 2017-03-05 | Discharge: 2017-03-05 | Disposition: A | Discharge status: Home / Self Care | Payer: Medicare HMO | Source: Ambulatory Visit | Attending: Internal Medicine | Admitting: Internal Medicine

## 2017-03-05 ENCOUNTER — Ambulatory Visit (INDEPENDENT_AMBULATORY_CARE_PROVIDER_SITE_OTHER): Payer: Medicare HMO | Admitting: Internal Medicine

## 2017-03-05 VITALS — BP 120/60 | HR 65 | Ht 64.0 in | Wt 208.0 lb

## 2017-03-05 DIAGNOSIS — I10 Essential (primary) hypertension: Secondary | ICD-10-CM | POA: Diagnosis not present

## 2017-03-05 DIAGNOSIS — D509 Iron deficiency anemia, unspecified: Secondary | ICD-10-CM

## 2017-03-05 DIAGNOSIS — I5032 Chronic diastolic (congestive) heart failure: Secondary | ICD-10-CM

## 2017-03-05 DIAGNOSIS — I25118 Atherosclerotic heart disease of native coronary artery with other forms of angina pectoris: Secondary | ICD-10-CM | POA: Diagnosis not present

## 2017-03-05 DIAGNOSIS — I5022 Chronic systolic (congestive) heart failure: Secondary | ICD-10-CM | POA: Diagnosis not present

## 2017-03-05 DIAGNOSIS — E785 Hyperlipidemia, unspecified: Secondary | ICD-10-CM | POA: Diagnosis not present

## 2017-03-05 LAB — CBC WITH DIFFERENTIAL/PLATELET
BASOS ABS: 0.1 10*3/uL (ref 0–0.1)
Basophils Relative: 1 %
Eosinophils Absolute: 0.3 10*3/uL (ref 0–0.7)
Eosinophils Relative: 4 %
HEMATOCRIT: 33.3 % — AB (ref 40.0–52.0)
HEMOGLOBIN: 10.9 g/dL — AB (ref 13.0–18.0)
LYMPHS ABS: 1.7 10*3/uL (ref 1.0–3.6)
LYMPHS PCT: 25 %
MCH: 27.1 pg (ref 26.0–34.0)
MCHC: 32.9 g/dL (ref 32.0–36.0)
MCV: 82.6 fL (ref 80.0–100.0)
Monocytes Absolute: 0.8 10*3/uL (ref 0.2–1.0)
Monocytes Relative: 12 %
NEUTROS ABS: 4 10*3/uL (ref 1.4–6.5)
NEUTROS PCT: 58 %
PLATELETS: 219 10*3/uL (ref 150–440)
RBC: 4.03 MIL/uL — AB (ref 4.40–5.90)
RDW: 19.8 % — ABNORMAL HIGH (ref 11.5–14.5)
WBC: 6.8 10*3/uL (ref 3.8–10.6)

## 2017-03-05 LAB — BASIC METABOLIC PANEL
ANION GAP: 8 (ref 5–15)
BUN: 26 mg/dL — AB (ref 6–20)
CHLORIDE: 103 mmol/L (ref 101–111)
CO2: 23 mmol/L (ref 22–32)
Calcium: 9.4 mg/dL (ref 8.9–10.3)
Creatinine, Ser: 1.86 mg/dL — ABNORMAL HIGH (ref 0.61–1.24)
GFR calc Af Amer: 37 mL/min — ABNORMAL LOW (ref >60)
GFR, EST NON AFRICAN AMERICAN: 32 mL/min — AB (ref >60)
GLUCOSE: 301 mg/dL — AB (ref 65–99)
POTASSIUM: 4.1 mmol/L (ref 3.5–5.1)
Sodium: 134 mmol/L — ABNORMAL LOW (ref 135–145)

## 2017-03-05 MED ORDER — FUROSEMIDE 40 MG PO TABS: ORAL_TABLET | ORAL | 3 refills | Status: DC

## 2017-03-05 NOTE — Patient Instructions (Signed)
Medication Instructions:  Your physician has recommended you make the following change in your medication:  1- INCREASE Furosemide to 40 mg (1 TABLET) by mouth two times a day for 2 days, then take 40 mg (1 TABLET) by mouth once a day.   Labwork: Your physician recommends that you return for lab work in: TODAY (BMET, CBC). - Please go to the [facility]. You will check in at the front desk to the right as you walk into the atrium. Valet Parking is offered if needed.    Testing/Procedures: none  Follow-Up: Your physician recommends that you schedule a follow-up appointment in: 2 WEEKS WITH DR END OR APP.   If you need a refill on your cardiac medications before your next appointment, please call your pharmacy.

## 2017-03-06 ENCOUNTER — Encounter: Payer: Self-pay | Admitting: Internal Medicine

## 2017-03-06 MED FILL — BOSULIF/100MG/TABS: BOSULIF/100MG/TABS | 30 days supply | Qty: 60 | Fill #5

## 2017-03-13 ENCOUNTER — Other Ambulatory Visit: Payer: Self-pay

## 2017-03-13 ENCOUNTER — Ambulatory Visit (INDEPENDENT_AMBULATORY_CARE_PROVIDER_SITE_OTHER): Payer: Medicare HMO | Admitting: Nurse Practitioner

## 2017-03-13 VITALS — BP 139/60 | HR 69 | Temp 98.3°F | Resp 16 | Wt 209.2 lb

## 2017-03-13 DIAGNOSIS — B9689 Other specified bacterial agents as the cause of diseases classified elsewhere: Secondary | ICD-10-CM | POA: Diagnosis not present

## 2017-03-13 DIAGNOSIS — R05 Cough: Secondary | ICD-10-CM | POA: Diagnosis not present

## 2017-03-13 DIAGNOSIS — J019 Acute sinusitis, unspecified: Secondary | ICD-10-CM

## 2017-03-13 DIAGNOSIS — R059 Cough, unspecified: Secondary | ICD-10-CM

## 2017-03-13 MED ORDER — IPRATROPIUM BROMIDE 0.06 % NA SOLN
2.0000 | Freq: Four times a day (QID) | NASAL | 0 refills | Status: DC
Start: 2017-03-13 — End: 2017-05-09

## 2017-03-13 MED ORDER — AMOXICILLIN 500 MG PO TABS: 500.0000 mg | ORAL_TABLET | Freq: Two times a day (BID) | ORAL | 0 refills | Status: DC

## 2017-03-13 MED ORDER — BENZONATATE 100 MG PO CAPS: 100.0000 mg | ORAL_CAPSULE | Freq: Two times a day (BID) | ORAL | 0 refills | Status: DC | PRN

## 2017-03-13 NOTE — Progress Notes (Signed)
Subjective:    Patient ID: Lucas Caldwell, male    DOB: 07-Nov-1933, 83 y.o.   MRN: [id number]  Lucas Caldwell is a 83 y.o. male presenting on 03/13/2017 for URI (cough, runny nose, fatigue, post nasal drainage and nauseated)   HPI URI symptoms Patient states he feels bad.  Feels like an "infection is moving from head to stomach."  Symptoms include sneezing, coughing, sinus congestion, nasal congestion.  Symptoms started approximately 10 days ago. - Some chills last night, no other fever or sweats.  No vomiting, constipation or diarrhea. - Patient has taken cough syrup and tylenol for symptoms with minimal relief. - Continues to have worsening of symptoms without improvement. -Patient has no other sick contacts.   Social History   Tobacco Use  . Smoking status: Former Smoker    Packs/day: 2.00    Years: 15.00    Pack years: 30.00    Types: Cigarettes  . Smokeless tobacco: Former Systems developer  . Tobacco comment: quit 1979  Substance Use Topics  . Alcohol use: No    Comment: previously drank heavily - quit 1979.  . Drug use: No    Review of Systems Per HPI unless specifically indicated above     Objective:    BP 139/60 (BP Location: Right Arm, Patient Position: Sitting, Cuff Size: Normal)   Pulse 69   Temp 98.3 F (36.8 C) (Oral)   Resp 16   Wt 209 lb 3.2 oz (94.9 kg)   SpO2 100%   BMI 35.91 kg/m   Wt Readings from Last 3 Encounters:  03/13/17 209 lb 3.2 oz (94.9 kg)  03/05/17 208 lb (94.3 kg)  01/27/17 205 lb 7 oz (93.2 kg)    Physical Exam  Constitutional: [name] oriented to person, place, and time. He appears well-developed and well-nourished. No distress.  HENT:  Head: Normocephalic and atraumatic.  Right Ear: Hearing, tympanic membrane, external ear and ear canal normal.  Left Ear: Hearing, tympanic membrane, external ear and ear canal normal.  Nose: Mucosal edema and rhinorrhea present. Right sinus exhibits maxillary sinus tenderness and frontal sinus  tenderness. Left sinus exhibits maxillary sinus tenderness and frontal sinus tenderness.  Mouth/Throat: Uvula is midline and mucous membranes are normal. Oropharyngeal exudate (clear secretions) and posterior oropharyngeal edema (cobblestoning) present.  Neck: Normal range of motion. Neck supple.  Cardiovascular: Normal rate, regular rhythm, S1 normal, S2 normal, normal heart sounds and intact distal pulses.  Pulmonary/Chest: Effort normal and breath sounds normal. No respiratory distress.  Lymphadenopathy:    He has no cervical adenopathy.  Neurological: [name] alert and oriented to person, place, and time.  Skin: Skin is warm and dry.  Psychiatric: He has a normal mood and affect. His behavior is normal.    Results for orders placed or performed during the hospital encounter of 03/05/17  CBC with Differential/Platelet  Result Value Ref Range   WBC 6.8 3.8 - 10.6 K/uL   RBC 4.03 (L) 4.40 - 5.90 MIL/uL   Hemoglobin 10.9 (L) 13.0 - 18.0 g/dL   HCT 33.3 (L) 40.0 - 52.0 %   MCV 82.6 80.0 - 100.0 fL   MCH 27.1 26.0 - 34.0 pg   MCHC 32.9 32.0 - 36.0 g/dL   RDW 19.8 (H) 11.5 - 14.5 %   Platelets 219 150 - 440 K/uL   Neutrophils Relative % 58 %   Neutro Abs 4.0 1.4 - 6.5 K/uL   Lymphocytes Relative 25 %   Lymphs Abs 1.7 1.0 -  3.6 K/uL   Monocytes Relative 12 %   Monocytes Absolute 0.8 0.2 - 1.0 K/uL   Eosinophils Relative 4 %   Eosinophils Absolute 0.3 0 - 0.7 K/uL   Basophils Relative 1 %   Basophils Absolute 0.1 0 - 0.1 K/uL  Basic metabolic panel  Result Value Ref Range   Sodium 134 (L) 135 - 145 mmol/L   Potassium 4.1 3.5 - 5.1 mmol/L   Chloride 103 101 - 111 mmol/L   CO2 23 22 - 32 mmol/L   Glucose, Bld 301 (H) 65 - 99 mg/dL   BUN 26 (H) 6 - 20 mg/dL   Creatinine, Ser 1.86 (H) 0.61 - 1.24 mg/dL   Calcium 9.4 8.9 - 10.3 mg/dL   GFR calc non Af Amer 32 (L) >60 mL/min   GFR calc Af Amer 37 (L) >60 mL/min   Anion gap 8 5 - 15      Assessment & Plan:   Problem List Items  Addressed This Visit    None    Visit Diagnoses    Acute bacterial rhinosinusitis    -  Primary Consistent with URI and secondary sinusitis with symptoms worsening over the past 7 days and initial symptoms of nasal congestion and sinus pressure over 2 weeks ago.   Plan: 1.START taking amoxicillin 500 mg tablets every 12 hours for 10 days.  Discussed completing antibiotic. - While on antibiotic, take a probiotic OTC or from food. - Start Atrovent nasal spray decongestant 2 sprays each nostril up to 4 times daily for 5-7 days - Continue anti-histamine loratadine 10mg  daily. - Can use Flonase 2 sprays each nostril daily for up to 4-6 weeks if no epistaxis. - Start Mucinex-DM OTC for  7-10 days prn congestion - START tessalon 100 mg bid prn cough 2. Supportive care with nasal saline, warm herbal tea with honey, 3. Improve hydration 4. Tylenol / Motrin PRN fevers  5. Return criteria given    Relevant Medications   benzonatate (TESSALON) 100 MG capsule   amoxicillin (AMOXIL) 500 MG tablet   ipratropium (ATROVENT) 0.06 % nasal spray   Cough     See AP sinusitis above.   Relevant Medications   benzonatate (TESSALON) 100 MG capsule      Meds ordered this encounter  Medications  . benzonatate (TESSALON) 100 MG capsule    Sig: Take 1 capsule (100 mg total) by mouth 2 (two) times daily as needed for cough.    Dispense:  20 capsule    Refill:  0    Order Specific Question:   Supervising Provider    Answer:   [name] [[id number]]  . amoxicillin (AMOXIL) 500 MG tablet    Sig: Take 1 tablet (500 mg total) by mouth 2 (two) times daily for 10 days.    Dispense:  20 tablet    Refill:  0    Order Specific Question:   Supervising Provider    Answer:   [name] [[id number]]  . ipratropium (ATROVENT) 0.06 % nasal spray    Sig: Place 2 sprays into both nostrils 4 (four) times daily.    Dispense:  15 mL    Refill:  0    Order Specific Question:   Supervising Provider     Answer:   [name] [[id number]]      Follow up plan: Return 5-7 days if symptoms worsen or fail to improve.  Cassell Smiles, DNP, AGPCNP-BC Adult Gerontology Primary Care Nurse Practitioner [place]  [facility] 03/13/2017, 2:43 PM

## 2017-03-13 NOTE — Patient Instructions (Addendum)
[name], Thank you for coming in to clinic today.  1. It sounds like you have a bacterial sinus infection after a cold.  Recommend good hand washing. - START taking amoxicillin 500 mg one tablet twice daily for 10 days.  Make sure to take all doses of your antibiotic. - While you are on an antibiotic, take a probiotic.  Antibiotics kill good and bad bacteria.  A probiotic helps to replace your good bacteria. Probiotic pills can be found over the counter.  One brand is Florastor, but you can use any brand you prefer.  You can also get good bacteria from foods like yogurt.  - Start Atrovent nasal spray decongestant 2 sprays each nostril up to 4 times daily for 5-7 days - Start cetirizine 10mg  daily.  Other over the counter medications you may try, if needed for symptoms are: - If congestion is worse, start OTC Mucinex (or may try Mucinex-DM for cough) up to 7-10 days then stop - You may try over the counter Nasal Saline spray (Simply Saline, Ocean Spray) as needed to reduce congestion. DO NOT use Afrin. - Start taking Tylenol extra strength 1 to 2 tablets every 6-8 hours for aches or fever/chills for next few days as needed.  Do not take more than 3,000 mg in 24 hours from all medicines.   - Drink warm herbal tea with honey for sore throat.   If symptoms are significantly worse with persistent fevers/chills despite tylenol/ibpurofen, nausea, vomiting unable to tolerate food/fluids or medicine, body aches, or shortness of breath, sinus pain pressure or worsening productive cough, then follow-up for re-evaluation, may seek more immediate care at Urgent Care or the ED if you are more concerned that it is an emergency.  2. You can use an over the counter wart remover for your left first finger wart.  Please schedule a follow-up appointment with Cassell Smiles, AGNP. Return 5-7 days if symptoms worsen or fail to improve.  If you have any other questions or concerns, please feel free to call  the clinic or send a message through [place]. You may also schedule an earlier appointment if necessary.  You will receive a survey after today's visit either digitally by e-mail or paper by [employer]. Your experiences and feedback matter to [place].  Please respond so we know how we are doing as we provide care for you.   Cassell Smiles, DNP, AGNP-BC Adult Gerontology Nurse Practitioner [place]

## 2017-03-19 ENCOUNTER — Encounter: Payer: Self-pay | Admitting: Internal Medicine

## 2017-03-19 ENCOUNTER — Ambulatory Visit (INDEPENDENT_AMBULATORY_CARE_PROVIDER_SITE_OTHER): Payer: Medicare HMO | Admitting: Internal Medicine

## 2017-03-19 ENCOUNTER — Other Ambulatory Visit
Admission: RE | Admit: 2017-03-19 | Discharge: 2017-03-19 | Disposition: A | Discharge status: Home / Self Care | Payer: Medicare HMO | Source: Ambulatory Visit | Attending: Internal Medicine | Admitting: Internal Medicine

## 2017-03-19 VITALS — BP 122/60 | HR 72 | Ht 64.0 in | Wt 210.0 lb

## 2017-03-19 DIAGNOSIS — N183 Chronic kidney disease, stage 3 unspecified: Secondary | ICD-10-CM

## 2017-03-19 DIAGNOSIS — I5032 Chronic diastolic (congestive) heart failure: Secondary | ICD-10-CM | POA: Diagnosis not present

## 2017-03-19 DIAGNOSIS — N189 Chronic kidney disease, unspecified: Secondary | ICD-10-CM | POA: Insufficient documentation

## 2017-03-19 DIAGNOSIS — I10 Essential (primary) hypertension: Secondary | ICD-10-CM | POA: Diagnosis not present

## 2017-03-19 DIAGNOSIS — I25118 Atherosclerotic heart disease of native coronary artery with other forms of angina pectoris: Secondary | ICD-10-CM | POA: Diagnosis not present

## 2017-03-19 LAB — BASIC METABOLIC PANEL
Anion gap: 13 (ref 5–15)
BUN: 45 mg/dL — AB (ref 6–20)
CALCIUM: 9.5 mg/dL (ref 8.9–10.3)
CO2: 20 mmol/L — ABNORMAL LOW (ref 22–32)
Chloride: 98 mmol/L — ABNORMAL LOW (ref 101–111)
Creatinine, Ser: 2.08 mg/dL — ABNORMAL HIGH (ref 0.61–1.24)
GFR calc Af Amer: 33 mL/min — ABNORMAL LOW (ref >60)
GFR, EST NON AFRICAN AMERICAN: 28 mL/min — AB (ref >60)
GLUCOSE: 258 mg/dL — AB (ref 65–99)
Potassium: 4.2 mmol/L (ref 3.5–5.1)
Sodium: 131 mmol/L — ABNORMAL LOW (ref 135–145)

## 2017-03-19 MED ORDER — ISOSORBIDE MONONITRATE ER 60 MG PO TB24: 60.0000 mg | ORAL_TABLET | Freq: Every day | ORAL | 3 refills | Status: DC

## 2017-03-19 NOTE — Patient Instructions (Signed)
Medication Instructions:  Your physician has recommended you make the following change in your medication:  1- INCREASE Imdur to 60 mg by mouth once a day.   Labwork: Your physician recommends that you return for lab work in: [place] (BMET). - Please go to the [facility]. You will check in at the front desk to the right as you walk into the atrium. Valet Parking is offered if needed.    Testing/Procedures: none  Follow-Up: Your physician recommends that you schedule a follow-up appointment in: 2-3 MONTHS WITH DR END.   If you need a refill on your cardiac medications before your next appointment, please call your pharmacy.

## 2017-03-19 NOTE — Progress Notes (Signed)
Follow-up Outpatient Visit Date: 03/19/2017  Primary Care Provider: [name], DO [address] [place] [address]  Chief Complaint: Shortness of breath  HPI:  Mr. Angelos is a 84 y.o. year-old male with history of coronary artery disease, mixed ischemic and nonischemic cardiomyopathy, hypertension, hyperlipidemia, type 2 diabetes mellitus, paroxysmal atrial fibrillation, chronic kidney disease stage III, CML, iron deficiency anemia, BPH, and hypothyroidism, who presents for follow-up of coronary artery disease and cardiomyopathy. I last saw Mr. Maglione on 03/05/17, at which time he reported increasing shortness of breath since her previous visit in October. He noted a 10 pound weight gain over that time as well as intermittent swelling of his feet and calves. He also endorsed a single episode of chest pain one week before our visit, lasting a few seconds. We agreed to increase furosemide to 40 mg twice a day for 2 days followed by 40 mg daily thereafter.  Today, Mr. Yan reports feeling slightly better than at our last visit.  His weight at home has decreased a few pounds, though it is actually up slightly today.  He attributes this to the heavy boots that he is wearing.  His exertional dyspnea is slightly better than at our last visit.  [name] still sometimes limited when he walks from one room to the other.  He has also noted a couple episodes of orthopnea when seated in his recliner.  He denies chest pain, palpitations, lightheadedness, and edema.  He remains compliant with his medications, including warfarin and ticagrelor (history of significant allergic reaction to clopidogrel).  Mr. Veronica is concerned about sinusitis, for which she is currently receiving antibiotics and nasal ipratropium.  --------------------------------------------------------------------------------------------------  Past Medical History:  Diagnosis Date  . Arthritis    "probably in his legs"  (08/15/2016)  . Asthma   . Blind right eye   . BPH (benign prostatic hyperplasia)   . Breast asymmetry    Left breast is larger, present for several years.  [name] CAD (coronary artery disease)    a. Cath in the late 90's - reportedly ok;  b. 2014 s/p stenting x 2 @ [facility]; c 08/19/16 Cath/PCI with DES -> RCA, plan to treat LM 70% medically. Seen by surgery and felt to be too high risk for CABG.  . Chronic combined systolic (congestive) and diastolic (congestive) heart failure ([place])    a. Previously reduced EF-->50% by echo in 2012;  b. 06/2015 Echo: EF 50-55%  c. 07/2016 Echo: EF 45-50%.  . CKD (chronic kidney disease), stage III ([place])   . CML (chronic myelocytic leukemia) ([place])   . GERD (gastroesophageal reflux disease)   . Gout   . Hyperlipemia   . Hypertension   . Hypothyroidism   . Iron deficiency anemia   . Migraine    "in the 1960s" (08/15/2016)  . NICM (nonischemic cardiomyopathy) ([place])    a. Previously reduced EF-->50% by echo in 2012;  b. 06/2015 Echo: EF 50-55%, Gr2 DD, mild MR, mildly dil LA, Ao sclerosis, mild TR;  c. 07/2016 Echo: EF 45-50%, ? inf HK, mild MR, mildly dil LA.  [name] PAF (paroxysmal atrial fibrillation) (HCC)    a. ? Dx 2014-->s/p DCCV;  b. CHA2DS2VASc = 6-->chronic coumadin.  . Prostate cancer ([place])   . RBBB   . Type II diabetes mellitus ([place])    Past Surgical History:  Procedure Laterality Date  . CATARACT EXTRACTION W/ INTRAOCULAR LENS  IMPLANT, BILATERAL Bilateral   . CORONARY ANGIOPLASTY WITH STENT PLACEMENT  09/2012   2 stents  . CORONARY STENT INTERVENTION N/A 08/19/2016   Procedure: Coronary Stent Intervention;  Surgeon: [name], MD;  Location: [place] [facility];  Service: Cardiovascular;  Laterality: N/A;  . ESOPHAGOGASTRODUODENOSCOPY N/A 12/20/2016   Procedure: ESOPHAGOGASTRODUODENOSCOPY (EGD);  Surgeon: Lin Landsman, MD;  [facility];  Service: Gastroenterology;  Laterality: N/A;  . HERNIA REPAIR     "navel"  . LEFT HEART CATH AND  CORONARY ANGIOGRAPHY N/A 08/14/2016   Procedure: Left Heart Cath and Coronary Angiography;  Surgeon: [name], MD;  Location: [place] [facility];  Service: Cardiovascular;  Laterality: N/A;  . TOE AMPUTATION Right    "big toe"   Recent CV Pertinent Labs: Lab Results  Component Value Date   CHOL 99 01/08/2017   HDL 21 (L) 01/08/2017   LDLCALC 54 01/08/2017   TRIG 118 01/08/2017   CHOLHDL 4.7 01/08/2017   INR 2.1 02/26/2017   INR 2.29 12/20/2016   INR 2.5 05/17/2012   BNP 33.0 12/18/2016   K 4.1 03/05/2017   K 4.2 05/18/2012   MG 2.2 01/27/2017   BUN 26 (H) 03/05/2017   BUN 38 (H) 05/18/2012   CREATININE 1.86 (H) 03/05/2017   CREATININE 1.26 05/18/2012    Past medical and surgical history were reviewed and updated in EPIC.  Current Meds  Medication Sig  . acetaminophen (TYLENOL) 500 MG tablet Take 500 mg by mouth every 6 (six) hours as needed.  [name] albuterol (PROVENTIL HFA;VENTOLIN HFA) 108 (90 BASE) MCG/ACT inhaler Inhale 2 puffs into the lungs every 6 (six) hours as needed for wheezing or shortness of breath.  . allopurinol (ZYLOPRIM) 100 MG tablet Take 100 mg by mouth daily.  [name] atorvastatin (LIPITOR) 80 MG tablet Take 1 tablet (80 mg total) by mouth daily at 6 PM.  . benzonatate (TESSALON) 100 MG capsule Take 1 capsule (100 mg total) by mouth 2 (two) times daily as needed for cough.  . bosutinib (BOSULIF) 100 MG tablet Take 200 mg by mouth at bedtime. Take with food.   . enalapril (VASOTEC) 5 MG tablet Take 5 mg by mouth daily.  . famotidine (PEPCID) 20 MG tablet Take 20 mg by mouth at bedtime as needed for heartburn. Take 2 hours after chemo pill.  . fenofibrate 160 MG tablet Take 1 tablet (160 mg total) by mouth daily.  . ferrous sulfate 325 (65 FE) MG EC tablet Take 1 tablet (325 mg total) by mouth 3 (three) times daily with meals.  . furosemide (LASIX) 40 MG tablet Take 1 tablet (40 mg) by mouth two times a day for 2 days, then decrease to 1 tablet (40 mg) by mouth  once a day.  . hydrocortisone 2.5 % ointment Apply topically 3 (three) times daily.  [name] ipratropium (ATROVENT) 0.06 % nasal spray Place 2 sprays into both nostrils 4 (four) times daily.  . isosorbide mononitrate (IMDUR) 30 MG 24 hr tablet Take 1 tablet (30 mg total) by mouth daily.  [name] LANTUS SOLOSTAR 100 UNIT/ML Solostar Pen Inject 20 Units into the skin at bedtime. (Patient taking differently: Inject 65 Units into the skin at bedtime. )  . levothyroxine (SYNTHROID, LEVOTHROID) 25 MCG tablet Take 25 mcg by mouth daily before breakfast.   . loperamide (IMODIUM A-D) 2 MG tablet Take 2 mg by mouth 3 (three) times daily as needed for diarrhea or loose stools.   . magnesium oxide (MAG-OX) 400 MG tablet Take 400 mg by mouth 2 (two) times daily.  [name]  metoprolol succinate (TOPROL-XL) 50 MG 24 hr tablet Take 50 mg by mouth every morning.  . Multiple Vitamins-Minerals (MULTIVITAMIN ADULTS 50+ PO) Take 1 tablet by mouth every morning.  . nepafenac (NEVANAC) 0.1 % ophthalmic suspension Place 1 drop into both eyes 3 (three) times daily as needed.  . nitroGLYCERIN (NITROSTAT) 0.4 MG SL tablet Place 1 tablet (0.4 mg total) under the tongue every 5 (five) minutes x 3 doses as needed for chest pain.  . pantoprazole (PROTONIX) 40 MG tablet Take 1 tablet (40 mg total) by mouth daily.  . pregabalin (LYRICA) 50 MG capsule Take 50 mg by mouth 3 (three) times daily.  . ranitidine (ZANTAC) 300 MG tablet Take 300 mg by mouth daily.  [name] Spacer/Aero-Holding Chambers (OPTICHAMBER ADVANTAGE) MISC 1 each by Other route once.  . ticagrelor (BRILINTA) 90 MG TABS tablet Take 1 tablet (90 mg total) by mouth 2 (two) times daily.  [name] warfarin (COUMADIN) 2 MG tablet Take 3 mg by mouth at bedtime.     Allergies: Clopidogrel; Ciprofloxacin; and Spironolactone  Social History   Socioeconomic History  . Marital status: Married    Spouse name: Not on file  . Number of children: Not on file  . Years of education: Not on file  .  Highest education level: Not on file  Social Needs  . Financial resource strain: Not on file  . Food insecurity - worry: Not on file  . Food insecurity - inability: Not on file  . Transportation needs - medical: Not on file  . Transportation needs - non-medical: Not on file  Occupational History  . 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  Tobacco Use  . Smoking status: Former Smoker    Packs/day: 2.00    Years: 15.00    Pack years: 30.00    Types: Cigarettes  . Smokeless tobacco: Former Systems developer  . Tobacco comment: quit 1979  Substance and Sexual Activity  . Alcohol use: No    Comment: previously drank heavily - quit 1979.  . Drug use: No  . Sexual activity: No  Other Topics Concern  . Not on file  Social History Narrative  . Not on file    Family History  Problem Relation Age of Onset  . Brain cancer Father   . Diabetes Sister   . Heart attack Sister     Review of Systems: A 12-system review of systems was performed and was negative except as noted in the HPI.  --------------------------------------------------------------------------------------------------  Physical Exam: BP 122/60 (BP Location: Left Arm, Patient Position: Sitting, Cuff Size: Large)   Pulse 72   Ht 5\' 4"  (1.626 m)   Wt 210 lb (95.3 kg)   BMI 36.05 kg/m   General: Obese man, seated comfortably on the exam table.  [name] accompanied by his wife. HEENT: No conjunctival pallor or scleral icterus. Moist mucous membranes.  OP clear. Neck: Supple without lymphadenopathy, thyromegaly, JVD, or HJR. No carotid bruit. Lungs: Normal work of breathing. Clear to auscultation bilaterally without wheezes or crackles. Heart: Regular rate and rhythm without murmurs, rubs, or gallops. Non-displaced PMI. Abd: Bowel sounds present. Soft, NT/ND without hepatosplenomegaly Ext: No lower extremity edema. Skin: Warm and dry without rash.  EKG: Normal sinus rhythm with right bundle branch block  and left anterior fascicular block (bifascicular block).  No significant change from prior tracing on 03/05/17.  Lab Results  Component Value Date   WBC 6.8 03/05/2017   HGB 10.9 (L) 03/05/2017  HCT 33.3 (L) 03/05/2017   MCV 82.6 03/05/2017   PLT 219 03/05/2017    Lab Results  Component Value Date   NA 134 (L) 03/05/2017   K 4.1 03/05/2017   CL 103 03/05/2017   CO2 23 03/05/2017   BUN 26 (H) 03/05/2017   CREATININE 1.86 (H) 03/05/2017   GLUCOSE 301 (H) 03/05/2017   ALT 40 01/27/2017    Lab Results  Component Value Date   CHOL 99 01/08/2017   HDL 21 (L) 01/08/2017   LDLCALC 54 01/08/2017   TRIG 118 01/08/2017   CHOLHDL 4.7 01/08/2017    --------------------------------------------------------------------------------------------------  ASSESSMENT AND PLAN: Coronary artery disease with stable angina Mr. Lopp is not had any chest pain, though I wonder if some of his shortness of breath may be an anginal equivalent.  He appears euvolemic on exam today, though he has not lost much weight since our prior visit.  Some of this may be related to the heavy boots that he is wearing today.  We have agreed to increase isosorbide mononitrate to 60 mg daily.  We discussed repeat catheterization with FFR of the LMCA and possible PCI.  Given that he still is able to perform most of his activities at home and has multiple comorbidities that increase the risk for catheterization complications, we have agreed to defer invasive procedures in favor of escalation of medical therapy.  Chronic diastolic heart failure Mr. Alsop has experienced modest improvement in his shortness of breath with escalation of furosemide after our last visit.  I will recheck a basic metabolic panel today to ensure stable renal function and potassium, given history of chronic kidney disease.  Essential hypertension Blood pressure is normal today.  As above, we will increase isosorbide mononitrate.  Chronic kidney  disease stage III Baseline creatinine is near 2.  I will recheck a BMP today given escalation of furosemide at our last visit.  Follow-up: Return to clinic in 2-3 months.  [name], MD 03/19/2017 3:51 PM

## 2017-03-20 ENCOUNTER — Other Ambulatory Visit: Payer: Self-pay | Admitting: *Deleted

## 2017-03-20 ENCOUNTER — Telehealth: Payer: Self-pay | Admitting: Internal Medicine

## 2017-03-20 MED ORDER — FUROSEMIDE 40 MG PO TABS: ORAL_TABLET | ORAL | 3 refills | Status: DC

## 2017-03-20 NOTE — Telephone Encounter (Signed)
Spoke with patients wife per release form and reviewed Dr. Darnelle Bos recommendations. She verbalized understanding with no further questions or concerns at this time.

## 2017-03-20 NOTE — Telephone Encounter (Signed)
Patients wife called in stating that she wanted to see if he needed to continue his iron pill. [name] taking it 3 times daily and it is making his stools dark. Advised that I would check with Dr. Saunders Revel for her and would give her a call back. She was appreciative for the information with no further questions at this time.

## 2017-03-20 NOTE — Telephone Encounter (Signed)
I recommend that [name] discuss continued iron treatment with his PCP and oncologist. He should continue it for now. Thanks.  [name], MD [facility] Pager: [phone]

## 2017-03-21 MED ORDER — ISOSORBIDE MONONITRATE ER 30 MG TABLET,EXTENDED RELEASE 24 HR
ORAL_TABLET | 1 refills | 0 days | Status: CP
Start: 2017-03-21 — End: 2017-06-11

## 2017-03-25 ENCOUNTER — Other Ambulatory Visit: Payer: Self-pay | Admitting: Internal Medicine

## 2017-03-25 MED ORDER — ISOSORBIDE MONONITRATE ER 60 MG PO TB24: 60.0000 mg | ORAL_TABLET | Freq: Every day | ORAL | 3 refills | Status: DC

## 2017-03-25 NOTE — Telephone Encounter (Signed)
Pt wanted his medication sent to a mail order pharmacy [facility]. Confirmation received.

## 2017-03-26 NOTE — Unmapped (Signed)
[facility] Refill Coordination Note  Specialty Medication(s): Bosulif 100mg       Ivan Pierce, DOB: 02-27-1934  Phone: [phone] (home) , Alternate phone contact: N/A  Phone or address changes today?: No  All above HIPAA information was verified with patient's family member. (wife, Ivan Pierce)  Shipping Address: [address]  [place] [address]   Insurance changes? No    Completed refill call assessment today to schedule patient's medication shipment from the [facility] [phone]).      Confirmed the medication and dosage are correct and have not changed: Yes, regimen is correct and unchanged.    Confirmed patient started or stopped the following medications in the past month:  No, there are no changes reported at this time.    Are you tolerating your medication?:  Ivan Pierce reports tolerating the medication.    ADHERENCE    Is this medicine transplant or covered by Medicare Part B? No.        Did you miss any doses in the past 4 weeks? No missed doses reported.    FINANCIAL/SHIPPING    Delivery Scheduled: Yes, Expected medication delivery date: 04/02/17     Ivan Pierce did not have any additional questions at this time.    Delivery address validated in [facility] scheduling system: Yes, address listed in [facility] is correct.    We will follow up with patient monthly for standard refill processing and delivery.      Thank you,  Ivan Pierce   [facility] Specialty Pharmacist

## 2017-03-28 MED ORDER — RANITIDINE 300 MG TABLET
ORAL_TABLET | 3 refills | 0 days | Status: CP
Start: 2017-03-28 — End: ?

## 2017-04-01 ENCOUNTER — Ambulatory Visit: Payer: Medicare HMO

## 2017-04-02 ENCOUNTER — Ambulatory Visit: Payer: Self-pay | Admitting: Family Medicine

## 2017-04-09 ENCOUNTER — Other Ambulatory Visit: Payer: Self-pay

## 2017-04-09 ENCOUNTER — Ambulatory Visit (INDEPENDENT_AMBULATORY_CARE_PROVIDER_SITE_OTHER): Payer: Medicare HMO

## 2017-04-09 DIAGNOSIS — Z7901 Long term (current) use of anticoagulants: Secondary | ICD-10-CM

## 2017-04-09 DIAGNOSIS — I480 Paroxysmal atrial fibrillation: Secondary | ICD-10-CM

## 2017-04-09 LAB — POCT INR: INR: 2

## 2017-04-09 MED ORDER — ALCOHOL SWABS 70 % PADS: MEDICATED_PAD | 11 refills | Status: DC

## 2017-04-09 NOTE — Patient Instructions (Signed)
Please take 2 tablets tonight, then continue taking 1.5 tablets daily.  Recheck INR in 6 weeks.

## 2017-04-15 DIAGNOSIS — H35372 Puckering of macula, left eye: Secondary | ICD-10-CM | POA: Diagnosis not present

## 2017-04-15 DIAGNOSIS — H35342 Macular cyst, hole, or pseudohole, left eye: Secondary | ICD-10-CM | POA: Diagnosis not present

## 2017-04-15 DIAGNOSIS — H1859 Other hereditary corneal dystrophies: Secondary | ICD-10-CM | POA: Diagnosis not present

## 2017-04-15 DIAGNOSIS — E119 Type 2 diabetes mellitus without complications: Secondary | ICD-10-CM | POA: Diagnosis not present

## 2017-04-15 MED ORDER — EASY TOUCH 31 GAUGE X 5/16" NEEDLE (8 MM)
1 refills | 0 days | Status: CP
Start: 2017-04-15 — End: 2017-09-18

## 2017-04-15 MED ORDER — LEVOTHYROXINE 50 MCG TABLET
ORAL_TABLET | 3 refills | 0 days | Status: CP
Start: 2017-04-15 — End: ?

## 2017-04-15 NOTE — Unmapped (Signed)
Patient has been approved to receive free Bosulif from [employer] til 03/17/2018. Medication will be shipped directly from [facility] to the patient.

## 2017-04-16 ENCOUNTER — Other Ambulatory Visit: Payer: Self-pay

## 2017-04-16 MED ORDER — INSULIN PEN NEEDLE 31G X 5 MM MISC: 31.0000 g | 5 refills | Status: DC

## 2017-04-17 ENCOUNTER — Other Ambulatory Visit: Admit: 2017-04-17 | Discharge: 2017-04-17 | Payer: MEDICARE

## 2017-04-17 ENCOUNTER — Ambulatory Visit
Admit: 2017-04-17 | Discharge: 2017-04-17 | Payer: MEDICARE | Attending: Hematology & Oncology | Primary: Hematology & Oncology

## 2017-04-17 ENCOUNTER — Ambulatory Visit: Admit: 2017-04-17 | Discharge: 2017-04-17 | Payer: MEDICARE | Attending: Oncology | Primary: Oncology

## 2017-04-17 DIAGNOSIS — C9210 Chronic myeloid leukemia, BCR/ABL-positive, not having achieved remission: Principal | ICD-10-CM

## 2017-04-17 DIAGNOSIS — E875 Hyperkalemia: Secondary | ICD-10-CM | POA: Diagnosis not present

## 2017-04-17 DIAGNOSIS — I480 Paroxysmal atrial fibrillation: Secondary | ICD-10-CM | POA: Diagnosis not present

## 2017-04-17 DIAGNOSIS — I509 Heart failure, unspecified: Secondary | ICD-10-CM | POA: Diagnosis not present

## 2017-04-17 DIAGNOSIS — Z9221 Personal history of antineoplastic chemotherapy: Secondary | ICD-10-CM | POA: Diagnosis not present

## 2017-04-17 DIAGNOSIS — I110 Hypertensive heart disease with heart failure: Secondary | ICD-10-CM | POA: Diagnosis not present

## 2017-04-17 DIAGNOSIS — E119 Type 2 diabetes mellitus without complications: Secondary | ICD-10-CM | POA: Diagnosis not present

## 2017-04-17 DIAGNOSIS — Z794 Long term (current) use of insulin: Secondary | ICD-10-CM | POA: Diagnosis not present

## 2017-04-17 DIAGNOSIS — I2510 Atherosclerotic heart disease of native coronary artery without angina pectoris: Secondary | ICD-10-CM | POA: Diagnosis not present

## 2017-04-21 ENCOUNTER — Telehealth: Payer: Self-pay | Admitting: Internal Medicine

## 2017-04-21 MED ORDER — TICAGRELOR 90 MG PO TABS: 90.0000 mg | ORAL_TABLET | Freq: Two times a day (BID) | ORAL | 3 refills | Status: DC

## 2017-04-21 MED ORDER — TICAGRELOR 90 MG PO TABS: 90.0000 mg | ORAL_TABLET | Freq: Two times a day (BID) | ORAL | 0 refills | Status: DC

## 2017-04-21 NOTE — Telephone Encounter (Signed)
°*  STAT* If patient is at the pharmacy, call can be transferred to refill team.   1. Which medications need to be refilled? (please list name of each medication and dose if known) Brilinta   2. Which pharmacy/location (including street and city if local pharmacy) is medication to be sent to? [facility], pt is completely out would like [place] to send a few to [facility]   3. Do they need a 30 day or 90 day supply? 90 day   She states [facility] has sent [place] paper work on this Please call back

## 2017-04-22 ENCOUNTER — Ambulatory Visit (INDEPENDENT_AMBULATORY_CARE_PROVIDER_SITE_OTHER): Payer: Medicare HMO | Admitting: Family Medicine

## 2017-04-22 ENCOUNTER — Inpatient Hospital Stay: Payer: Medicare HMO | Attending: Oncology

## 2017-04-22 ENCOUNTER — Encounter: Payer: Self-pay | Admitting: Family Medicine

## 2017-04-22 VITALS — BP 114/59 | HR 65 | Temp 98.3°F | Resp 16 | Ht 64.0 in | Wt 209.0 lb

## 2017-04-22 DIAGNOSIS — E1165 Type 2 diabetes mellitus with hyperglycemia: Secondary | ICD-10-CM | POA: Diagnosis not present

## 2017-04-22 DIAGNOSIS — E1121 Type 2 diabetes mellitus with diabetic nephropathy: Secondary | ICD-10-CM | POA: Diagnosis not present

## 2017-04-22 DIAGNOSIS — D509 Iron deficiency anemia, unspecified: Secondary | ICD-10-CM

## 2017-04-22 DIAGNOSIS — N183 Chronic kidney disease, stage 3 unspecified: Secondary | ICD-10-CM

## 2017-04-22 DIAGNOSIS — C9210 Chronic myeloid leukemia, BCR/ABL-positive, not having achieved remission: Secondary | ICD-10-CM

## 2017-04-22 DIAGNOSIS — E1142 Type 2 diabetes mellitus with diabetic polyneuropathy: Secondary | ICD-10-CM

## 2017-04-22 DIAGNOSIS — E1151 Type 2 diabetes mellitus with diabetic peripheral angiopathy without gangrene: Secondary | ICD-10-CM | POA: Diagnosis not present

## 2017-04-22 DIAGNOSIS — IMO0002 Reserved for concepts with insufficient information to code with codable children: Secondary | ICD-10-CM

## 2017-04-22 LAB — POCT GLYCOSYLATED HEMOGLOBIN (HGB A1C): Hemoglobin A1C: 9.6 — AB (ref ?–5.7)

## 2017-04-22 MED ORDER — GABAPENTIN 300 MG PO CAPS: 300.0000 mg | ORAL_CAPSULE | Freq: Two times a day (BID) | ORAL | 5 refills | Status: DC

## 2017-04-22 NOTE — Assessment & Plan Note (Signed)
Improved Hgb >12 now Advised reduce dose ferrous sulfate to BID then may consider adjusting lower Advised follow-up with Heme/Onc to discuss further

## 2017-04-22 NOTE — Assessment & Plan Note (Signed)
Stable, history of CML in remission per chart review Followed by [facility] Dr [name] Recently DC'd chemotherapy Follow-up monthly PCR

## 2017-04-22 NOTE — Progress Notes (Signed)
Subjective:    Patient ID: Lucas Caldwell, male    DOB: 1934-03-18, 84 y.o.   MRN: [id number]  Lucas Caldwell is a 84 y.o. male presenting on 04/22/2017 for Diabetes (highest 210 and lowest 53)   HPI   Specialists: Cardiology - Dr [name] End [facility] Cardiology) Oncology - Dr Delight Hoh [facility]) Gastroenterology - Dr Sherri Sear ([facility])  Nephrology - Dr Murlean Iba ([place]) not established outpatient - seen in hospital for CKD)  CKD-III / Anemia, secondary to CKD / CML Complicated history with AoCKD recently hospitalized, known CKD. Not followed by Nephrology, will plan to establish with Dr Sherri Rad, also has significant CAD, see below and CHF. Also taking diuretic with Lasix 20mg  daily with good results - Prior Hgb lower, now recent result in January 2019 has had Hgb up to >12, on ferrous sulfate TID - Recently stopped chemotherapy for CML per Oncology, now less than 1 week later, stopped Bosutinib, has follow-up monthly PCR check and labs - Gi side effects improved off Chemo now.  CAD s/p stents (last 09/2016), CHF (NYHA Class III), PAF on chronic Anticoagulation - Followed by [facility], - He reports still has some dyspnea, without chest pain or pressure. Continues BB and isosorbide. Continued on Coumadin and Brillinta for up to 12 mo goal - On Coumadin long-term - Has PAF, with intermittent episodes, he is asymptomatic and is not aware - Admits dyspnea on exertion with NYHA Class III - History of edema recently controlled  CHRONIC DM, Type 2: Recent A1c trend improved from 10.4 to 9.6. No further hypoglycemia episodes CBGs: Avg 120-150 in AM fasting, High < 200. Checks CBGs 1-2 x daily Meds: Lantus 50 u now in AM after breakfast (moved from PM dosing due to prior hypoglycemia) Reports good compliance. Tolerating well w/o side-effects Currently on ACEi Lifestyle: - Diet (tries to improve DM diet)  - Exercise (limited by dyspnea) - Admits chronic DM  Neuropathy, was on lyrica 50mg  TID, cannot afford due to cost now - Admits numbness tingling pain in feet at times - Denies hypoglycemia   Depression screen [facility] 2/9 04/22/2017 01/22/2017  Decreased Interest 0 0  Down, Depressed, Hopeless 0 0  PHQ - 2 Score 0 0    Social History   Tobacco Use  . Smoking status: Former Smoker    Packs/day: 2.00    Years: 15.00    Pack years: 30.00    Types: Cigarettes  . Smokeless tobacco: Former Systems developer  . Tobacco comment: quit 1979  Substance Use Topics  . Alcohol use: No    Comment: previously drank heavily - quit 1979.  . Drug use: No    Review of Systems Per HPI unless specifically indicated above     Objective:    BP (!) 114/59   Pulse 65   Temp 98.3 F (36.8 C) (Oral)   Resp 16   Ht 5\' 4"  (1.626 m)   Wt 209 lb (94.8 kg)   BMI 35.87 kg/m   Wt Readings from Last 3 Encounters:  04/22/17 209 lb (94.8 kg)  03/19/17 210 lb (95.3 kg)  03/13/17 209 lb 3.2 oz (94.9 kg)    Physical Exam  Constitutional: [name] oriented to person, place, and time. He appears well-developed and well-nourished. No distress.  Well-appearing 84 year old elderly male, comfortable, cooperative, has cane  HENT:  Head: Normocephalic and atraumatic.  Mouth/Throat: Oropharynx is clear and moist.  Moist mucus mem and tongue  Eyes: Conjunctivae  are normal. Right eye exhibits no discharge. Left eye exhibits no discharge.  Neck: Normal range of motion. Neck supple.  Cardiovascular: Normal rate, regular rhythm, normal heart sounds and intact distal pulses.  No murmur heard. Pulmonary/Chest: Effort normal and breath sounds normal. No respiratory distress. He has no wheezes. He has no rales.  Good air movement. No focal abnormality.  Musculoskeletal: He exhibits no edema.  Neurological: [name] alert and oriented to person, place, and time.  Skin: Skin is warm and dry. No rash noted. [name] not diaphoretic. No erythema.  Psychiatric: He has a normal mood and affect.  His behavior is normal.  Well groomed, good eye contact, normal speech and thoughts  Nursing note and vitals reviewed.     Recent Labs    12/20/16 0430 04/22/17 1630  HGBA1C 10.4* 9.6*    Results for orders placed or performed in visit on 04/22/17  POCT HgB A1C  Result Value Ref Range   Hemoglobin A1C 9.6 (A) 5.7      Assessment & Plan:   Problem List Items Addressed This Visit    CKD (chronic kidney disease), stage III (HCC)    Chronic CKD, some worsening elevated Cr on recent checks Not established yet with Dr Sherri Rad outpatient Caution with lasix Advised to schedule f/u with Dr Candiss Norse Caution renal dosing meds, would reduce Allopurinol in future if Cr still elevated      CML (chronic myeloid leukemia) (HCC)    Stable, history of CML in remission per chart review Followed by [facility] Dr [name] Recently DC'd chemotherapy Follow-up monthly PCR      Relevant Medications   gabapentin (NEURONTIN) 300 MG capsule   Diabetic neuropathy associated with type 2 diabetes mellitus ([place])    Limited control of DM neuropathy Concern with cost and renal insufficiency on Lyrica - still not optimally controlled before Will return to prior Gabapentin, rx 300mg  capsules take 1 daily then max dose if need 1 cap BID for max daily dose 600mg  due to renal dosing, caution sedation      Relevant Medications   gabapentin (NEURONTIN) 300 MG capsule   Iron deficiency anemia    Improved Hgb >12 now Advised reduce dose ferrous sulfate to BID then may consider adjusting lower Advised follow-up with Heme/Onc to discuss further      Uncontrolled type 2 diabetes mellitus with nephropathy ([place]) - Primary    Uncontrolled DM with A1c improved now 9.6, from prior 40.9 Complications - hypoglycemia (improved), CKD-III, peripheral neuropathy, cataracts, CAD  Plan:  1. Discussed DM management concern with CKD and risk of hypoglycemia - For now continue Lantus 50u AM dosing not PM - since improved  hypoglycemia - Consider future titrate up lantus , limited options for DM - Future would consider alternative option such as GLP1 instead of Lantus if able to titrate down, as anticipate CKD extends duration of insulin - concern with cost of GLP1 tho, handout given to check cost. - Counseling on risk of hypoglycemia and symptoms 2. Encourage improved lifestyle 3. Check CBG, bring log to next visit for review 4. Continue anticoag anti platelet, ACEi, Statin 5. Follow-up 3 months for DM A1c, med adjust      Relevant Orders   POCT HgB A1C (Completed)      Meds ordered this encounter  Medications  . gabapentin (NEURONTIN) 300 MG capsule    Sig: Take 1 capsule (300 mg total) by mouth 2 (two) times daily. Start with 1 daily for  1-2 weeks then increase to 2    Dispense:  60 capsule    Refill:  5     Follow up plan: Return in about 3 months (around 07/20/2017) for DM A1c, Neuropathy.  [name], [facility] 04/22/2017, 10:41 PM

## 2017-04-22 NOTE — Telephone Encounter (Signed)
Patient in lobby Wants to discuss the medication issue Says she called but no one has called her back yet Please discuss

## 2017-04-22 NOTE — Assessment & Plan Note (Signed)
Limited control of DM neuropathy Concern with cost and renal insufficiency on Lyrica - still not optimally controlled before Will return to prior Gabapentin, rx 300mg  capsules take 1 daily then max dose if need 1 cap BID for max daily dose 600mg  due to renal dosing, caution sedation

## 2017-04-22 NOTE — Telephone Encounter (Signed)
[name], ok per DPR, presents in the office today. Patient is out of his Brilinta and she states they cannot afford it. She has already called the [employer] patient assistance foundation and they are sending her an application.  I provided patient with a printed application, samples, and a coupon card to take to his pharmacy.  Drug name: Brilinta       Strength: 90 mg        Qty: 4 bottles  LOT: [id number]  Exp.Date: 07/2019  Physician portion of application for Dr End to sign is in his basket awaiting a signature.  Wife will bring patient's portion of application back once complete for our office to fax.

## 2017-04-22 NOTE — Assessment & Plan Note (Signed)
Chronic CKD, some worsening elevated Cr on recent checks Not established yet with Dr Sherri Rad outpatient Caution with lasix Advised to schedule f/u with Dr Candiss Norse Caution renal dosing meds, would reduce Allopurinol in future if Cr still elevated

## 2017-04-22 NOTE — Patient Instructions (Addendum)
Thank you for coming to the office today.  1.  Last checked 04/17/17 had improved iron or hemoglobin level - almost back to normal range.  May REDUCE Ferrous Sulfate (red iron pill) to TWICE daily now with food instead of 3 times daily  Ask your Cancer doctor about this next time, and see if you can reduce further to [place] with food and possibly stop it  2.  A1c 9.6 - improved from 10.4  Continue Lantus 50 units in morning after breakfast - may need to gradually increase in future  Keep improving diet  STOP Lyrica - start Gabapentin 300mg  capsules - take one tab daily for now then increase to one capsules TWICE daily if tolerated after 1-2 weeks  Call insurance find cost and coverage of the following  1. Ozempic (Semaglutide injection) - start 0.25mg  weekly for 4 weeks then increase to 0.5mg  weekly - This one has best benefit of weight loss and reducing Cardiovascular events  2. Bydureon BCise (Exenatide ER) - once weekly - this is my preference, very good medicine well tolerated, less side effects of nausea, upset stomach. No dose changes. Cost and coverage is the problem, but we may be able to get it with the coupon card  3. Trulicity (Dulaglutide) - once weekly - this is very good one, usually one of my top choices as well, two doses, 0.75 (likely we would start) and 1.5 max dose. We can use coupon card here too  4. Victoza (Liraglutide) - once DAILY - 3 dose changes 0.6, 1.2 and 1.8, side effects nausea, upset stomach higher on this one but it is still very effective medicine   Please schedule a Follow-up Appointment to: Return in about 3 months (around 07/20/2017) for DM A1c, Neuropathy.    If you have any other questions or concerns, please feel free to call the office or send a message through [place]. You may also schedule an earlier appointment if necessary.  Additionally, you may be receiving a survey about your experience at our office within a few days to 1 week by  e-mail or mail. We value your feedback.  [name], DO [place]

## 2017-04-22 NOTE — Assessment & Plan Note (Signed)
Uncontrolled DM with A1c improved now 9.6, from prior 94.8 Complications - hypoglycemia (improved), CKD-III, peripheral neuropathy, cataracts, CAD  Plan:  1. Discussed DM management concern with CKD and risk of hypoglycemia - For now continue Lantus 50u AM dosing not PM - since improved hypoglycemia - Consider future titrate up lantus , limited options for DM - Future would consider alternative option such as GLP1 instead of Lantus if able to titrate down, as anticipate CKD extends duration of insulin - concern with cost of GLP1 tho, handout given to check cost. - Counseling on risk of hypoglycemia and symptoms 2. Encourage improved lifestyle 3. Check CBG, bring log to next visit for review 4. Continue anticoag anti platelet, ACEi, Statin 5. Follow-up 3 months for DM A1c, med adjust

## 2017-04-23 ENCOUNTER — Other Ambulatory Visit: Payer: Self-pay | Admitting: Family Medicine

## 2017-04-23 ENCOUNTER — Telehealth: Payer: Self-pay | Admitting: Internal Medicine

## 2017-04-23 DIAGNOSIS — E1165 Type 2 diabetes mellitus with hyperglycemia: Principal | ICD-10-CM

## 2017-04-23 DIAGNOSIS — IMO0002 Reserved for concepts with insufficient information to code with codable children: Secondary | ICD-10-CM

## 2017-04-23 DIAGNOSIS — E1121 Type 2 diabetes mellitus with diabetic nephropathy: Secondary | ICD-10-CM

## 2017-04-23 MED ORDER — LANTUS SOLOSTAR 100 UNIT/ML ~~LOC~~ SOPN: 50.0000 [IU] | PEN_INJECTOR | Freq: Every day | SUBCUTANEOUS | 3 refills | Status: DC

## 2017-04-23 NOTE — Telephone Encounter (Signed)
ticagrelor (BRILINTA) 90 MG TABS tablet 60 tablet 0 04/21/2017    Sig - Route: Take 1 tablet (90 mg total) by mouth 2 (two) times daily. - Oral   Sent to pharmacy as: ticagrelor (BRILINTA) 90 MG Tab tablet   E-Prescribing Status: Receipt confirmed by pharmacy (04/21/2017 11:55 AM EST)    Refill sent in 04/21/2017. Called and spoke with Amber from [facility] to confirm they received the refill she stated it would be ready within the next 30 minutes. Called patient and made them aware.

## 2017-04-23 NOTE — Telephone Encounter (Signed)
°*  STAT* If patient is at the pharmacy, call can be transferred to refill team.   1. Which medications need to be refilled? (please list name of each medication and dose if known) Brilinta 90 mg po BID   2. Which pharmacy/location (including street and city if local pharmacy) is medication to be sent to? [facility] [address]   3. Do they need a 30 day or 90 day supply? [place]

## 2017-04-24 ENCOUNTER — Telehealth: Payer: Self-pay | Admitting: *Deleted

## 2017-04-24 NOTE — Telephone Encounter (Signed)
[name] presents to lobby today with completed patient assistance application for Brilinta. Dr End completed and signed physician portion.  Application along with copies of insurance cards, proof of income faxed to [employer]. Wife provided with original copies of application, insurance cards and income statements. She was very Patent attorney.

## 2017-04-24 NOTE — Telephone Encounter (Signed)
Per Patients Wife request to Cancel appt. She stated that she will call back to reschedule

## 2017-04-25 ENCOUNTER — Ambulatory Visit: Payer: Medicare HMO | Admitting: Family Medicine

## 2017-04-29 ENCOUNTER — Ambulatory Visit: Payer: Medicare HMO | Admitting: Oncology

## 2017-05-01 ENCOUNTER — Telehealth: Payer: Self-pay | Admitting: *Deleted

## 2017-05-01 NOTE — Telephone Encounter (Signed)
Received fax from [name] regarding tier reduction for [facility], it was denied stating this,  "Medicare says you can request a tiering exception so long as there is a lower cost brand name to treat your condition, there is no lower cost brand, you do not qualify".  There are notes stating patients wife has contacted patient assistance through company and is in the process of completing these forms.

## 2017-05-02 ENCOUNTER — Telehealth: Payer: Self-pay | Admitting: Family Medicine

## 2017-05-02 DIAGNOSIS — E1129 Type 2 diabetes mellitus with other diabetic kidney complication: Secondary | ICD-10-CM | POA: Diagnosis not present

## 2017-05-02 DIAGNOSIS — E1165 Type 2 diabetes mellitus with hyperglycemia: Principal | ICD-10-CM

## 2017-05-02 DIAGNOSIS — E1121 Type 2 diabetes mellitus with diabetic nephropathy: Secondary | ICD-10-CM

## 2017-05-02 DIAGNOSIS — I10 Essential (primary) hypertension: Secondary | ICD-10-CM | POA: Diagnosis not present

## 2017-05-02 DIAGNOSIS — IMO0002 Reserved for concepts with insufficient information to code with codable children: Secondary | ICD-10-CM

## 2017-05-02 DIAGNOSIS — N183 Chronic kidney disease, stage 3 (moderate): Secondary | ICD-10-CM | POA: Diagnosis not present

## 2017-05-02 MED ORDER — INSULIN DETEMIR 100 UNIT/ML FLEXPEN: 50.0000 [IU] | PEN_INJECTOR | Freq: Every day | SUBCUTANEOUS | 5 refills | Status: DC

## 2017-05-02 NOTE — Telephone Encounter (Addendum)
Called [employer] reviewed forms that we received, lantus solostar pen no longer on formulary, denied rx. Sent new rx Levemir Flextouch pen as this is covered on formulary, 90 day supply 50 units daily after breakfast, prior alternatives include Tyler Aas, Toujeo as well.  --------------------- Please call patient to notify that [place] has changed - he will no longer receive lantus now it will be levemir. Same dosing. Call us with questions if needed.  [name], [facility] 05/02/2017, 1:34 PM

## 2017-05-02 NOTE — Telephone Encounter (Signed)
The pt and his wife was notified about the change of his insulin. They both verbalize understanding, with no questions or concerns.

## 2017-05-05 ENCOUNTER — Telehealth: Payer: Self-pay | Admitting: *Deleted

## 2017-05-05 NOTE — Telephone Encounter (Signed)
Received Prior Authorization Request Form for additional information for Brilinta. Form completed and signed by Dr End. Faxed to number provided [phone].

## 2017-05-06 NOTE — Telephone Encounter (Signed)
**Note De-Identified  Obfuscation** This pt is seen by Dr End in our [place] office. Arlester Marker, RN attempted to do this tier exception but received a denial from [facility].  We received a letter from [facility] that includes an appeal form. I am sending this message and faxing this appeal form to the [place] office so they can determine if an appeal is appropriate.

## 2017-05-06 NOTE — Telephone Encounter (Signed)
Called to check on status of appeal for Brilinta and it was denied. The next option would be to submit it to a third party called [employer] [phone].  Called and s/w patient's wife. She states she did go and spend $45 on a month supply of Brilinta for the patient. She is waiting to here from [facility] [place]. It may be that she needs to spend a certain amount out of pocket before they will approve the patient for assistance.   While speaking with patient she also said patient is more short of breath especially with exertion. Denies significant swelling and has not been weighing himself. She states he "gets winded" when going to get the mail. Patient even feels it is more than it was. Patient is currently taking furosemide 40 mg daily (says he did the alternating 40 mg and 20 mg but went back to 40 mg daily). BP has been 115-120s/70's.  Advised I will route to Dr End for advice.

## 2017-05-06 NOTE — Telephone Encounter (Signed)
Scheduler called patient and he is coming in tomorrow to see [name], Np.

## 2017-05-06 NOTE — Telephone Encounter (Signed)
If shortness of breath is noticeably worse, we should try to work him in sometime this week. As for patient assistance, we will need to wait and see what happens. Thanks.  [name], MD [facility] Pager: [phone]

## 2017-05-07 ENCOUNTER — Telehealth: Payer: Self-pay | Admitting: Family Medicine

## 2017-05-07 ENCOUNTER — Ambulatory Visit (INDEPENDENT_AMBULATORY_CARE_PROVIDER_SITE_OTHER): Payer: Medicare HMO | Admitting: Nurse Practitioner

## 2017-05-07 ENCOUNTER — Encounter: Payer: Self-pay | Admitting: Nurse Practitioner

## 2017-05-07 VITALS — BP 104/60 | HR 68 | Ht 64.0 in | Wt 209.8 lb

## 2017-05-07 DIAGNOSIS — E785 Hyperlipidemia, unspecified: Secondary | ICD-10-CM

## 2017-05-07 DIAGNOSIS — E1122 Type 2 diabetes mellitus with diabetic chronic kidney disease: Secondary | ICD-10-CM

## 2017-05-07 DIAGNOSIS — N184 Chronic kidney disease, stage 4 (severe): Secondary | ICD-10-CM

## 2017-05-07 DIAGNOSIS — I2510 Atherosclerotic heart disease of native coronary artery without angina pectoris: Secondary | ICD-10-CM | POA: Diagnosis not present

## 2017-05-07 DIAGNOSIS — I10 Essential (primary) hypertension: Secondary | ICD-10-CM

## 2017-05-07 DIAGNOSIS — Z794 Long term (current) use of insulin: Secondary | ICD-10-CM | POA: Diagnosis not present

## 2017-05-07 DIAGNOSIS — I5022 Chronic systolic (congestive) heart failure: Secondary | ICD-10-CM

## 2017-05-07 DIAGNOSIS — I255 Ischemic cardiomyopathy: Secondary | ICD-10-CM | POA: Diagnosis not present

## 2017-05-07 MED ORDER — TORSEMIDE 20 MG PO TABS: ORAL_TABLET | ORAL | 2 refills | Status: DC

## 2017-05-07 NOTE — Telephone Encounter (Signed)
[name] called to get the name of the pen needles that pt uses [phone]

## 2017-05-07 NOTE — Progress Notes (Signed)
Office Visit    Patient Name: Lucas Caldwell Date of Encounter: 05/07/2017  Primary Care Provider:  [name], DO Primary Cardiologist:  [name], MD  Chief Complaint    84 year old male with a history of CAD, mixed ischemic and nonischemic cardiomyopathy, hypertension, hyperlipidemia, type 2 diabetes mellitus, paroxysmal atrial fibrillation on chronic Coumadin, stage III chronic kidney disease, CML, iron deficiency anemia, BPH, and hypothyroidism, who presents for follow-up with ongoing dyspnea.  Past Medical History    Past Medical History:  Diagnosis Date  . Arthritis    "probably in his legs" (08/15/2016)  . Asthma   . Blind right eye   . BPH (benign prostatic hyperplasia)   . Breast asymmetry    Left breast is larger, present for several years.  [name] CAD (coronary artery disease)    a. Cath in the late 90's - reportedly ok;  b. 2014 s/p stenting x 2 @ [facility]; c 08/19/16 Cath/PCI with DES -> RCA, plan to treat LM 70% medically. Seen by surgery and felt to be too high risk for CABG.  . Chronic combined systolic (congestive) and diastolic (congestive) heart failure ([place])    a. Previously reduced EF-->50% by echo in 2012;  b. 06/2015 Echo: EF 50-55%  c. 07/2016 Echo: EF 45-50%.  . CKD (chronic kidney disease), stage III ([place])   . CML (chronic myelocytic leukemia) ([place])   . GERD (gastroesophageal reflux disease)   . Gout   . Hyperlipemia   . Hypertension   . Hypothyroidism   . Iron deficiency anemia   . Migraine    "in the 1960s" (08/15/2016)  . NICM (nonischemic cardiomyopathy) ([place])    a. Previously reduced EF-->50% by echo in 2012;  b. 06/2015 Echo: EF 50-55%, Gr2 DD, mild MR, mildly dil LA, Ao sclerosis, mild TR;  c. 07/2016 Echo: EF 45-50%, ? inf HK, mild MR, mildly dil LA.  [name] PAF (paroxysmal atrial fibrillation) ([facility])    a. ? Dx 2014-->s/p DCCV;  b. CHA2DS2VASc = 6-->chronic coumadin.  . Prostate cancer ([place])   . RBBB   . Type II diabetes mellitus  ([place])    Past Surgical History:  Procedure Laterality Date  . CATARACT EXTRACTION W/ INTRAOCULAR LENS  IMPLANT, BILATERAL Bilateral   . CORONARY ANGIOPLASTY WITH STENT PLACEMENT  09/2012   2 stents  . CORONARY STENT INTERVENTION N/A 08/19/2016   Procedure: Coronary Stent Intervention;  Surgeon: [name], MD;  Location: [facility];  Service: Cardiovascular;  Laterality: N/A;  . ESOPHAGOGASTRODUODENOSCOPY N/A 12/20/2016   Procedure: ESOPHAGOGASTRODUODENOSCOPY (EGD);  Surgeon: Lin Landsman, MD;  Location: [facility] ENDOSCOPY;  Service: Gastroenterology;  Laterality: N/A;  . HERNIA REPAIR     "navel"  . LEFT HEART CATH AND CORONARY ANGIOGRAPHY N/A 08/14/2016   Procedure: Left Heart Cath and Coronary Angiography;  Surgeon: [name], MD;  Location: [place] [facility];  Service: Cardiovascular;  Laterality: N/A;  . TOE AMPUTATION Right    "big toe"    Allergies  Allergies  Allergen Reactions  . Clopidogrel Anaphylaxis    Other reaction(s): ANAPHYLAXIS;altered mental status;  (electrolytes "out of wack" and confusion per family; THEY DO NOT REMEMBER ANY OTHER REACTION)- MSB 10/10/15  . Ciprofloxacin Itching  . Spironolactone     Other reaction(s): Other (See Comments) gynecomastia    History of Present Illness    84 year old male with the above complex past medical history.  He has a history of nonischemic cardiomyopathy dating back to the  late 1990s.  He was later diagnosed with coronary artery disease and underwent stenting x2 at [facility] in 2014.  LV function normalized by echo in April 2017.  In May 2018, he was admitted to [facility] regional with progressive dyspnea.  EF showed EF 45-50%.  Catheterization showed 70% left main disease with severe right coronary artery stenosis.  He was transferred to [facility] and was seen by CT surgery but felt to be too high risk for surgery.  He subsequently underwent successful PCI and drug-eluting stent placement to the right  coronary artery.  Left main disease has been managed medically.  His other history includes hypertension, hyperlipidemia, diabetes, paroxysmal A. fib on Coumadin, stage III chronic kidney disease, CML, iron deficiency anemia, BPH, obesity, and hypothyroidism.  Following PCI, he did reasonably well as an outpatient but was admitted to [facility] regional in October 2018 with dysphasia and acute on chronic anemia.  Upper endoscopy revealed a Schatzki's ring and gastric erosion with stigmata of recent bleeding.  He was doing well when seen back in the office in October.  He was seen again in mid December and reported dyspnea on exertion.  This was associated with a 10 pound weight gain and lower extremity swelling.  His Lasix dose was increased to 40 mg daily and he had some improvement in dyspnea when he was seen back in clinic on January 2.  Follow-up basic metabolic panel at that time showed slight elevation in creatinine from 1.86 to 2.08 and he was advised to reduce Lasix to 40 mg alternating with 20 mg every other day.  Despite this recommendation, patient's wife says she has been giving him 40 mg daily ever since then.    Unfortunately, he continues to have significant dyspnea on exertion with minimal activity.  He has chronic 2 pillow orthopnea but he thinks this may be getting worse and he notices dyspnea upon lying down almost immediately.  He has not any significant lower extremity swelling but has noted increasing abdominal girth and difficulty buttoning his pants were tightening his belt.  He has not had any PND and denies chest pain, palpitations, dizziness, syncope, or early satiety.  He does not weigh himself at home.  His weight today is 209 pounds which is in line with what it has been since early January.  Of note, he was 196 in October.  Home Medications    Prior to Admission medications   Medication Sig Start Date End Date Taking? Authorizing Provider  acetaminophen (TYLENOL) 500 MG tablet  Take 500 mg by mouth every 6 (six) hours as needed.   Yes [provider]  albuterol (PROVENTIL HFA;VENTOLIN HFA) 108 (90 BASE) MCG/ACT inhaler Inhale 2 puffs into the lungs every 6 (six) hours as needed for wheezing or shortness of breath. 11/04/14  Yes Ahmed Prima, MD  Alcohol Swabs 70 % PADS Use when checking blood sugar up to 3x daily 04/09/17  Yes Karamalegos, [name], DO  allopurinol (ZYLOPRIM) 100 MG tablet Take 100 mg by mouth daily. 02/03/17  Yes [provider]  atorvastatin (LIPITOR) 80 MG tablet Take 1 tablet (80 mg total) by mouth daily at 6 PM. 08/20/16  Yes Reino Bellis B, NP  bosutinib (BOSULIF) 100 MG tablet Take 200 mg by mouth at bedtime. Take with food.    Yes [provider]  enalapril (VASOTEC) 5 MG tablet Take 5 mg by mouth daily.   Yes [provider]  famotidine (PEPCID) 20 MG tablet Take 20  mg by mouth at bedtime as needed for heartburn. Take 2 hours after chemo pill.   Yes [provider]  fenofibrate 160 MG tablet Take 1 tablet (160 mg total) by mouth daily. 08/21/16  Yes Reino Bellis B, NP  ferrous sulfate 325 (65 FE) MG EC tablet Take 1 tablet (325 mg total) by mouth 3 (three) times daily with meals. 01/08/17  Yes End, [name], MD  furosemide (LASIX) 40 MG tablet Take 40 mg (1 tab) by mouth once a day, alternate with 20 mg (0.5 tab) by mouth once a day the alternate day. 03/20/17  Yes End, [name], MD  gabapentin (NEURONTIN) 300 MG capsule Take 1 capsule (300 mg total) by mouth 2 (two) times daily. Start with 1 daily for 1-2 weeks then increase to 2 04/22/17  Yes Karamalegos, [name], DO  hydrocortisone 2.5 % ointment Apply topically 3 (three) times daily. 07/20/16  Yes Triplett, Cari B, FNP  Insulin Detemir (LEVEMIR FLEXTOUCH) 100 UNIT/ML Pen Inject 50 Units into the skin daily. After breakfast 05/02/17  Yes Karamalegos, [name], DO  Insulin Pen Needle 31G X 5 MM MISC 31 g by Does not apply route as  directed. 04/16/17  Yes [name], NP  ipratropium (ATROVENT) 0.06 % nasal spray Place 2 sprays into both nostrils 4 (four) times daily. 03/13/17  Yes [name], NP  isosorbide mononitrate (IMDUR) 60 MG 24 hr tablet Take 1 tablet (60 mg total) by mouth daily. 03/25/17  Yes End, [name], MD  levothyroxine (SYNTHROID, LEVOTHROID) 25 MCG tablet Take 25 mcg by mouth daily before breakfast.  10/27/15  Yes [provider]  loperamide (IMODIUM A-D) 2 MG tablet Take 2 mg by mouth 3 (three) times daily as needed for diarrhea or loose stools.    Yes [provider]  magnesium oxide (MAG-OX) 400 MG tablet Take 400 mg by mouth 2 (two) times daily.   Yes [provider]  metoprolol succinate (TOPROL-XL) 50 MG 24 hr tablet Take 50 mg by mouth every morning. 12/04/15  Yes [provider]  Multiple Vitamins-Minerals (MULTIVITAMIN ADULTS 50+ PO) Take 1 tablet by mouth every morning.   Yes [provider]  nepafenac (NEVANAC) 0.1 % ophthalmic suspension Place 1 drop into both eyes 3 (three) times daily as needed.   Yes [provider]  nitroGLYCERIN (NITROSTAT) 0.4 MG SL tablet Place 1 tablet (0.4 mg total) under the tongue every 5 (five) minutes x 3 doses as needed for chest pain. 10/02/16  Yes End, [name], MD  pantoprazole (PROTONIX) 40 MG tablet Take 1 tablet (40 mg total) by mouth daily. 12/21/16  Yes Max Sane, MD  ranitidine (ZANTAC) 300 MG tablet Take 300 mg by mouth daily.   Yes [provider]  Spacer/Aero-Holding [facility] ADVANTAGE) MISC 1 each by Other route once. 11/04/14  Yes Ahmed Prima, MD  ticagrelor (BRILINTA) 90 MG TABS tablet Take 1 tablet (90 mg total) by mouth 2 (two) times daily. 04/21/17  Yes End, [name], MD  warfarin (COUMADIN) 2 MG tablet Take 3 mg by mouth at bedtime.    Yes [provider]    Review of Systems    Ongoing dyspnea on exertion with slight worsening of  chronic 2 pillow orthopnea and increasing abdominal girth.  He denies chest pain, palpitations, PND, dizziness, syncope, edema, or early satiety.  All other systems reviewed and are otherwise negative except as noted above.  Physical Exam    VS:  BP 104/60 (BP  Location: Left Arm, Patient Position: Sitting, Cuff Size: Large)   Pulse 68   Ht 5\' 4"  (1.626 m)   Wt 209 lb 12 oz (95.1 kg)   BMI 36.00 kg/m  , BMI Body mass index is 36 kg/m. GEN: Well nourished, well developed, in no acute distress.  HEENT: normal.  Neck: Supple, difficult to gauge JVP.  No carotid bruits, or masses. Cardiac: RRR, no murmurs, rubs, or gallops. No clubbing, cyanosis, edema.  Radials/DP/PT 2+ and equal bilaterally.  Respiratory:  Respirations regular and unlabored, clear to auscultation bilaterally. GI: Protuberant, semi-firm, BS + x 4. MS: no deformity or atrophy. Skin: warm and dry, no rash. Neuro:  Strength and sensation are intact. Psych: Normal affect.  Accessory Clinical Findings    ECG -regular sinus rhythm, 68, PVCs, left axis deviation, left anterior fascicular block, right bundle branch block  Assessment & Plan    1.  Acute on chronic combined systolic and diastolic congestive heart failure/mixed ischemic and nonischemic cardiomyopathy: EF of 45-50% in May 2018.  Patient has been struggling with ongoing dyspnea on exertion over the past few months.  Though his weight is been stable over the past 2 months, it is actually up approximately 13 pounds since October, when he was feeling better.  In December, he seemed to respond to escalation of Lasix dose.  He was advised in January to reduce Lasix to 40 alternating with 20 every other day in the setting of mild creatinine elevation though he notes that he never dropped his dose and has been taking 40 mg daily.  Dyspnea has progressed and he also has had some worsening of baseline orthopnea and also increasing abdominal girth.  His abdomen is somewhat firm  today.  He has no lower extremity edema and lungs are clear.  I am going to follow-up a basic metabolic panel today in the setting of a higher dose of Lasix and we had expected.  I am also going to switch him from Lasix to torsemide 40 mg daily for 3 days and then 20 mg daily thereafter in hopes of having more success at getting to his gut edema.  I will see him back next week to reassess and follow-up a basic metabolic panel at that time as well.  If he has reasonable response from torsemide but no improvement in symptoms, we will again need to reconsider whether or not dyspnea represents an anginal equivalent.  He remains on beta-blocker and ACE inhibitor therapy.  2.  Coronary artery disease: He has not been having chest pain.  As above, he has been having progressive dyspnea on exertion in the setting of some degree of volume overload.  Diuresing as above.  He has known 70% left main stenosis and previously underwent RCA stenting last spring.  He was not felt to be a candidate for bypass surgery.  If symptoms do not improve with adequate diuresis, we will again consider diagnostic catheterization with fractional flow reserve in the left main to determine if high risk stenting is appropriate.  This of course is complicated by chronic kidney disease and a creatinine that hovers around 2.  Continue statin, beta-blocker, ACE inhibitor, Brilinta, and nitrate therapy.  No aspirin in the setting of ongoing warfarin and Brilinta.  3.  Essential hypertension: Stable.  Next  4.  Hyperlipidemia: LDL was 54 in October 2018.  He remains on statin therapy.  5.  Paroxysmal atrial for ablation: Maintaining sinus rhythm.  No recent palpitations.  [name] on  chronic Coumadin anticoagulation.  INRs have been therapeutic dating back several months.  6.  Iron deficiency anemia: H&H was stable in December.  Plan to repeat CBC at follow-up next week.  7.  Type 2 diabetes mellitus: Inadequately controlled with an A1c of 9.6  on February 5.  [name] followed closely by primary care and remains on insulin therapy.  8.  Stage IV chronic kidney disease: [name] followed by nephrology.  I will check a basic metabolic panel today and plan to repeat in 1 week given change to diuretic therapy.  9.  Disposition: Follow-up labs today.  I will see him back next week.  [name], NP 05/07/2017, 3:46 PM

## 2017-05-07 NOTE — Patient Instructions (Signed)
Medication Instructions:  Your physician has recommended you make the following change in your medication:  1- STOP Lasix. 2- START Torsemide 40 mg (2 tablets) by mouth once a day for 3 days, then take 20 mg (1 tablet) by mouth once a day.   Labwork: Your physician recommends that you return for lab work in: [place]  (BM[facility]).   Testing/Procedures: none  Follow-Up: Your physician recommends that you schedule a follow-up appointment in: [place].  05/13/2017 AT 2 PM.   If you need a refill on your cardiac medications before your next appointment, please call your pharmacy.

## 2017-05-07 NOTE — Telephone Encounter (Signed)
The patient wife [name] was informed again of the new insulin medication.

## 2017-05-08 LAB — BASIC METABOLIC PANEL
BUN / CREAT RATIO: 18 (ref 10–24)
BUN: 32 mg/dL — ABNORMAL HIGH (ref 8–27)
CHLORIDE: 103 mmol/L (ref 96–106)
CO2: 18 mmol/L — ABNORMAL LOW (ref 20–29)
Calcium: 9.8 mg/dL (ref 8.6–10.2)
Creatinine, Ser: 1.82 mg/dL — ABNORMAL HIGH (ref 0.76–1.27)
GFR calc non Af Amer: 34 mL/min/{1.73_m2} — ABNORMAL LOW (ref >59)
GFR, EST AFRICAN AMERICAN: 39 mL/min/{1.73_m2} — AB (ref >59)
GLUCOSE: 244 mg/dL — AB (ref 65–99)
Potassium: 4.6 mmol/L (ref 3.5–5.2)
Sodium: 143 mmol/L (ref 134–144)

## 2017-05-09 ENCOUNTER — Other Ambulatory Visit: Payer: Self-pay

## 2017-05-09 ENCOUNTER — Encounter: Payer: Self-pay | Admitting: Nurse Practitioner

## 2017-05-09 ENCOUNTER — Ambulatory Visit (INDEPENDENT_AMBULATORY_CARE_PROVIDER_SITE_OTHER): Payer: Medicare HMO | Admitting: Nurse Practitioner

## 2017-05-09 DIAGNOSIS — J019 Acute sinusitis, unspecified: Secondary | ICD-10-CM

## 2017-05-09 MED ORDER — BENZONATATE 100 MG PO CAPS: 100.0000 mg | ORAL_CAPSULE | Freq: Two times a day (BID) | ORAL | 0 refills | Status: DC | PRN

## 2017-05-09 MED ORDER — IPRATROPIUM BROMIDE 0.06 % NA SOLN
2.0000 | Freq: Four times a day (QID) | NASAL | 0 refills | Status: DC
Start: 2017-05-09 — End: 2017-08-12

## 2017-05-09 NOTE — Patient Instructions (Addendum)
Mr. Altergott, Thank you for coming in to clinic today.  1. It sounds like you have a Upper Respiratory Virus - this will most likely run it's course in 7 to 10 days. Recommend good hand washing. - Start Atrovent nasal spray decongestant 2 sprays each nostril up to 4 times daily for 5-7 days - Start anti-histamine loratadine 10mg  daily,  - Start OTC Mucinex (or may try Mucinex-DM for cough) up to 7-10 days then stop - Drink plenty of fluids to improve congestion - You may try over the counter Nasal Saline spray (Simply Saline, Ocean Spray) as needed to reduce congestion. - Drink warm herbal tea with honey for sore throat. - Start taking Tylenol extra strength 1 to 2 tablets every 6-8 hours for aches or fever/chills for next few days as needed.  Do not take more than 3,000 mg in 24 hours from all medicines.  May take Ibuprofen as well if tolerated 200-400mg  every 8 hours as needed.  If symptoms significantly worsening with persistent fevers/chills despite tylenol/ibpurofen, nausea, vomiting unable to tolerate food/fluids or medicine, body aches, or shortness of breath, sinus pain pressure or worsening productive cough, then follow-up for re-evaluation, may seek more immediate care at Urgent Care or ED if more concerned for emergency.  Please schedule a follow-up appointment with Cassell Smiles, AGNP. Return 5-7 days if symptoms worsen or fail to improve.  If you have any other questions or concerns, please feel free to call the clinic or send a message through [place]. You may also schedule an earlier appointment if necessary.  You will receive a survey after today's visit either digitally by e-mail or paper by [employer]. Your experiences and feedback matter to [place].  Please respond so we know how we are doing as we provide care for you.   Cassell Smiles, DNP, AGNP-BC Adult Gerontology Nurse Practitioner [place]

## 2017-05-09 NOTE — Progress Notes (Signed)
Subjective:    Patient ID: Lucas Caldwell, male    DOB: 13-Jun-1933, 84 y.o.   MRN: [id number]  Lucas Caldwell is a 84 y.o. male presenting on 05/09/2017 for Cough (nasal congestion, productive cough x 1 week )   HPI URI symptoms Pt has had symptoms of cough productive, sneezing, rhinorrhea, nasal congestion, mild headache  for 1 week with slow worsening - Symptoms are preventing sleep.  - Denies fever, chills, sweats, nausea, vomiting, diarrhea.   - Denies ear symptoms, sinus pressure, sore throat.  - Has not taken OTC meds regularly.  Pt has taken "a few" acetaminophen occasionally and Tussin for cough without significant relief.  Social History   Tobacco Use  . Smoking status: Former Smoker    Packs/day: 2.00    Years: 15.00    Pack years: 30.00    Types: Cigarettes  . Smokeless tobacco: Former Systems developer  . Tobacco comment: quit 1979  Substance Use Topics  . Alcohol use: No    Comment: previously drank heavily - quit 1979.  . Drug use: No    Review of Systems Per HPI unless specifically indicated above     Objective:    BP (!) 112/58 (BP Location: Right Arm, Patient Position: Sitting, Cuff Size: Large)   Pulse 75   Temp 98.6 F (37 C) (Oral)   Resp 18   Ht 5\' 4"  (1.626 m)   Wt 209 lb 3.2 oz (94.9 kg)   SpO2 100%   BMI 35.91 kg/m   Wt Readings from Last 3 Encounters:  05/13/17 205 lb (93 kg)  05/09/17 209 lb 3.2 oz (94.9 kg)  05/07/17 209 lb 12 oz (95.1 kg)    Physical Exam  Constitutional: [name] oriented to person, place, and time. He appears well-developed and well-nourished. He appears distressed (mildly).  HENT:  Head: Normocephalic and atraumatic.  Right Ear: Hearing, tympanic membrane, external ear and ear canal normal.  Left Ear: Hearing, tympanic membrane, external ear and ear canal normal.  Nose: Mucosal edema and rhinorrhea present. Right sinus exhibits maxillary sinus tenderness and frontal sinus tenderness. Left sinus exhibits maxillary sinus  tenderness and frontal sinus tenderness.  Mouth/Throat: Uvula is midline and mucous membranes are normal. Posterior oropharyngeal edema (cobblestoning) present. Oropharyngeal exudate: clear secretions.  Neck: Normal range of motion. Neck supple.  Cardiovascular: Normal rate, regular rhythm, S1 normal, S2 normal, normal heart sounds and intact distal pulses.  Pulmonary/Chest: Effort normal and breath sounds normal. No respiratory distress.  Lymphadenopathy:    He has cervical adenopathy.  Neurological: [name] alert and oriented to person, place, and time.  Skin: Skin is warm and dry.  Psychiatric: He has a normal mood and affect. His behavior is normal.  Vitals reviewed.  Results for orders placed or performed in visit on 46/96/29  Basic metabolic panel  Result Value Ref Range   Glucose 244 (H) 65 - 99 mg/dL   BUN 32 (H) 8 - 27 mg/dL   Creatinine, Ser 1.82 (H) 0.76 - 1.27 mg/dL   GFR calc non Af Amer 34 (L) >59 mL/min/1.73   GFR calc Af Amer 39 (L) >59 mL/min/1.73   BUN/Creatinine Ratio 18 10 - 24   Sodium 143 134 - 144 mmol/L   Potassium 4.6 3.5 - 5.2 mmol/L   Chloride 103 96 - 106 mmol/L   CO2 18 (L) 20 - 29 mmol/L   Calcium 9.8 8.6 - 10.2 mg/dL      Assessment & Plan:  Problem List Items Addressed This Visit    None    Visit Diagnoses    Acute viral rhinosinusitis       Relevant Medications   benzonatate (TESSALON) 100 MG capsule   ipratropium (ATROVENT) 0.06 % nasal spray     Acute illness. Fever responsive to NSAIDs and tylenol.  Symptoms not worsening. Consistent with viral illness x 7 days with worsening over last 2 days that may indicate bacterial sinusitis.   No known sick contacts and no identifiable focal infections of ears, nose, throat.  Plan: 1. Reassurance, likely self-limited with cough lasting up to few weeks - Discussed risk of antibiotic therapy when not indicated.  Pt verbalizes understanding and agrees to defer if symptoms do not improve. - Start  Atrovent nasal spray decongestant 2 sprays each nostril up to 4 times daily for 5-7 days - Start anti-histamine Loratadine or Cetirizine 10mg  daily,  - also can use Flonase 2 sprays each nostril daily for up to 4-6 weeks - Start Mucinex-DM OTC up to 7-10 days then stop - Start tessalon perles 100 mg every 12 hours as needed for cough. 2. Supportive care with nasal saline, warm herbal tea with honey, 3. Improve hydration 4. Tylenol / Motrin PRN fevers 5. Return criteria given  Meds ordered this encounter  Medications  . benzonatate (TESSALON) 100 MG capsule    Sig: Take 1 capsule (100 mg total) by mouth 2 (two) times daily as needed for cough.    Dispense:  20 capsule    Refill:  0    Order Specific Question:   Supervising Provider    Answer:   [name] [[id number]]  . ipratropium (ATROVENT) 0.06 % nasal spray    Sig: Place 2 sprays into both nostrils 4 (four) times daily.    Dispense:  15 mL    Refill:  0    Order Specific Question:   Supervising Provider    Answer:   [name] [[id number]]      Follow up plan: Return 5-7 days if symptoms worsen or fail to improve.  Cassell Smiles, DNP, AGPCNP-BC Adult Gerontology Primary Care Nurse Practitioner [facility] 05/15/2017, 5:27 PM

## 2017-05-13 ENCOUNTER — Encounter: Payer: Self-pay | Admitting: Nurse Practitioner

## 2017-05-13 ENCOUNTER — Telehealth: Payer: Self-pay | Admitting: *Deleted

## 2017-05-13 ENCOUNTER — Ambulatory Visit (INDEPENDENT_AMBULATORY_CARE_PROVIDER_SITE_OTHER): Payer: Medicare HMO | Admitting: Nurse Practitioner

## 2017-05-13 ENCOUNTER — Other Ambulatory Visit
Admission: RE | Admit: 2017-05-13 | Discharge: 2017-05-13 | Disposition: A | Discharge status: Home / Self Care | Payer: Medicare HMO | Source: Ambulatory Visit | Attending: Nurse Practitioner | Admitting: Nurse Practitioner

## 2017-05-13 VITALS — BP 112/68 | HR 72 | Ht 65.0 in | Wt 205.0 lb

## 2017-05-13 DIAGNOSIS — I5022 Chronic systolic (congestive) heart failure: Secondary | ICD-10-CM

## 2017-05-13 DIAGNOSIS — N184 Chronic kidney disease, stage 4 (severe): Secondary | ICD-10-CM

## 2017-05-13 DIAGNOSIS — I2510 Atherosclerotic heart disease of native coronary artery without angina pectoris: Secondary | ICD-10-CM | POA: Diagnosis not present

## 2017-05-13 DIAGNOSIS — I5043 Acute on chronic combined systolic (congestive) and diastolic (congestive) heart failure: Secondary | ICD-10-CM

## 2017-05-13 DIAGNOSIS — Z79899 Other long term (current) drug therapy: Secondary | ICD-10-CM | POA: Diagnosis not present

## 2017-05-13 DIAGNOSIS — I255 Ischemic cardiomyopathy: Secondary | ICD-10-CM | POA: Diagnosis not present

## 2017-05-13 DIAGNOSIS — I10 Essential (primary) hypertension: Secondary | ICD-10-CM

## 2017-05-13 LAB — BASIC METABOLIC PANEL
Anion gap: 10 (ref 5–15)
BUN: 39 mg/dL — AB (ref 6–20)
CALCIUM: 9.4 mg/dL (ref 8.9–10.3)
CO2: 24 mmol/L (ref 22–32)
CREATININE: 1.97 mg/dL — AB (ref 0.61–1.24)
Chloride: 98 mmol/L — ABNORMAL LOW (ref 101–111)
GFR calc Af Amer: 35 mL/min — ABNORMAL LOW (ref >60)
GFR, EST NON AFRICAN AMERICAN: 30 mL/min — AB (ref >60)
Glucose, Bld: 279 mg/dL — ABNORMAL HIGH (ref 65–99)
Potassium: 4.5 mmol/L (ref 3.5–5.1)
SODIUM: 132 mmol/L — AB (ref 135–145)

## 2017-05-13 MED ORDER — ALBUTEROL SULFATE HFA 108 (90 BASE) MCG/ACT IN AERS: 2.0000 | INHALATION_SPRAY | Freq: Four times a day (QID) | RESPIRATORY_TRACT | 2 refills | Status: DC | PRN

## 2017-05-13 MED ORDER — TORSEMIDE 20 MG PO TABS: 40.0000 mg | ORAL_TABLET | Freq: Every day | ORAL | 3 refills | Status: DC

## 2017-05-13 NOTE — Telephone Encounter (Signed)
Patient was denied for Brilinta. [employer] gives option for 3rd party appeal through [place]. Paperwork completed and awaiting MD signature.  Paperwork on Dr Darnelle Bos desk for signing, then will fax to [facility].

## 2017-05-13 NOTE — Progress Notes (Signed)
Office Visit    Patient Name: Lucas Caldwell Date of Encounter: 05/13/2017  Primary Care Provider:  [name], DO Primary Cardiologist:  [name], MD  Chief Complaint    84 year old male with a history of CAD, mixed ischemic and nonischemic cardia myopathy, hypertension, hyperlipidemia, type 2 diabetes mellitus, paroxysmal atrial for ablation on chronic Coumadin, stage III chronic kidney disease, CML, iron deficiency anemia, BPH, and hypothyroidism, who presents for follow-up in the setting of volume overload and dyspnea.  Past Medical History    Past Medical History:  Diagnosis Date  . Arthritis    "probably in his legs" (08/15/2016)  . Asthma   . Blind right eye   . BPH (benign prostatic hyperplasia)   . Breast asymmetry    Left breast is larger, present for several years.  [name] CAD (coronary artery disease)    a. Cath in the late 90's - reportedly ok;  b. 2014 s/p stenting x 2 @ [facility]; c 08/19/16 Cath/PCI with DES -> RCA, plan to treat LM 70% medically. Seen by surgery and felt to be too high risk for CABG.  . Chronic combined systolic (congestive) and diastolic (congestive) heart failure ([place])    a. Previously reduced EF-->50% by echo in 2012;  b. 06/2015 Echo: EF 50-55%  c. 07/2016 Echo: EF 45-50%.  . Chronic kidney disease (CKD), stage IV (severe) ([place])   . CML (chronic myelocytic leukemia) ([place])   . GERD (gastroesophageal reflux disease)   . Gout   . Hyperlipemia   . Hypertension   . Hypothyroidism   . Iron deficiency anemia   . Migraine    "in the 1960s" (08/15/2016)  . NICM (nonischemic cardiomyopathy) ([place])    a. Previously reduced EF-->50% by echo in 2012;  b. 06/2015 Echo: EF 50-55%, Gr2 DD, mild MR, mildly dil LA, Ao sclerosis, mild TR;  c. 07/2016 Echo: EF 45-50%, ? inf HK, mild MR, mildly dil LA.  [name] PAF (paroxysmal atrial fibrillation) ([facility])    a. ? Dx 2014-->s/p DCCV;  b. CHA2DS2VASc = 6-->chronic coumadin.  . Prostate cancer ([employer])   . RBBB     . Type II diabetes mellitus ([place])    Past Surgical History:  Procedure Laterality Date  . CATARACT EXTRACTION W/ INTRAOCULAR LENS  IMPLANT, BILATERAL Bilateral   . CORONARY ANGIOPLASTY WITH STENT PLACEMENT  09/2012   2 stents  . CORONARY STENT INTERVENTION N/A 08/19/2016   Procedure: Coronary Stent Intervention;  Surgeon: [name], MD;  Location: [facility];  Service: Cardiovascular;  Laterality: N/A;  . ESOPHAGOGASTRODUODENOSCOPY N/A 12/20/2016   Procedure: ESOPHAGOGASTRODUODENOSCOPY (EGD);  Surgeon: Lin Landsman, MD;  Location: [facility] ENDOSCOPY;  Service: Gastroenterology;  Laterality: N/A;  . HERNIA REPAIR     "navel"  . LEFT HEART CATH AND CORONARY ANGIOGRAPHY N/A 08/14/2016   Procedure: Left Heart Cath and Coronary Angiography;  Surgeon: [name], MD;  Location: [facility];  Service: Cardiovascular;  Laterality: N/A;  . TOE AMPUTATION Right    "big toe"    Allergies  Allergies  Allergen Reactions  . Clopidogrel Anaphylaxis    Other reaction(s): ANAPHYLAXIS;altered mental status;  (electrolytes "out of wack" and confusion per family; THEY DO NOT REMEMBER ANY OTHER REACTION)- MSB 10/10/15  . Ciprofloxacin Itching  . Spironolactone     Other reaction(s): Other (See Comments) gynecomastia    History of Present Illness    84 year old male with the above complex past medical history.  He has  a history of nonischemic cardiomyopathy dating back to the late 1990s.  He was later diagnosed with coronary artery disease and underwent stenting x2 at [facility] in 2014.  LV function normalized by echo in April 2017.  In May 2018, he was admitted to [facility] regional with progressive dyspnea.  Echo showed EF of 45-50%.  Catheterization showed 70% left main disease with severe right coronary artery disease.  He was transferred to [facility] and seen by CT surgery but felt to be too high risk for surgery.  He subsequently underwent successful PCI and drug-eluting stent  placement to the right coronary artery.  Left main disease has been medically managed.  Following PCI, he did recently well as an outpatient but was admitted to [facility] regional in October 2018 with dysphasia and acute on chronic anemia.  Upper endoscopy revealed a Schatzki's ring and gastric erosion with stigmata of recent bleeding.  He has remained on warfarin and Brilinta therapy and has had stable hemoglobins.  [name] on iron for iron deficiency anemia.  He has been having persistent dyspnea on exertion dating back to mid December with variable response to escalation of Lasix dosing.  I saw him in clinic on February 20, at which time he reported worsening dyspnea, increasing abdominal girth, and orthopnea.  His weight was 209 pounds, up from 196 in October 2018.  Transition him from oral Lasix to oral torsemide and had him do 40 mg x 3 days and then drop to 20 mg daily.  He says that though he did not notice a significant change in urine output, he has felt a little bit better.  He still has dyspnea on exertion but can go a little bit further and thinks he has had in the right direction.  His weight is down 4 pounds from his last visit.  He denies chest pain, palpitations, dizziness, syncope, edema, or early satiety.  Home Medications    Prior to Admission medications   Medication Sig Start Date End Date Taking? Authorizing Provider  acetaminophen (TYLENOL) 500 MG tablet Take 500 mg by mouth every 6 (six) hours as needed.   Yes [provider]  albuterol (PROVENTIL HFA;VENTOLIN HFA) 108 (90 BASE) MCG/ACT inhaler Inhale 2 puffs into the lungs every 6 (six) hours as needed for wheezing or shortness of breath. 11/04/14  Yes Ahmed Prima, MD  Alcohol Swabs 70 % PADS Use when checking blood sugar up to 3x daily 04/09/17  Yes Karamalegos, [name], DO  allopurinol (ZYLOPRIM) 100 MG tablet Take 100 mg by mouth daily. 02/03/17  Yes [provider]  atorvastatin (LIPITOR) 80 MG  tablet Take 1 tablet (80 mg total) by mouth daily at 6 PM. 08/20/16  Yes Cheryln Manly, NP  benzonatate (TESSALON) 100 MG capsule Take 1 capsule (100 mg total) by mouth 2 (two) times daily as needed for cough. 05/09/17  Yes [name], NP  bosutinib (BOSULIF) 100 MG tablet Take 200 mg by mouth at bedtime. Take with food.    Yes [provider]  enalapril (VASOTEC) 5 MG tablet Take 5 mg by mouth daily.   Yes [provider]  famotidine (PEPCID) 20 MG tablet Take 20 mg by mouth at bedtime as needed for heartburn. Take 2 hours after chemo pill.   Yes [provider]  fenofibrate 160 MG tablet Take 1 tablet (160 mg total) by mouth daily. 08/21/16  Yes Reino Bellis B, NP  ferrous sulfate 325 (65 FE) MG EC tablet Take  1 tablet (325 mg total) by mouth 3 (three) times daily with meals. 01/08/17  Yes End, [name], MD  gabapentin (NEURONTIN) 300 MG capsule Take 1 capsule (300 mg total) by mouth 2 (two) times daily. Start with 1 daily for 1-2 weeks then increase to 2 04/22/17  Yes Karamalegos, [name], DO  hydrocortisone 2.5 % ointment Apply topically 3 (three) times daily. 07/20/16  Yes Triplett, Cari B, FNP  Insulin Detemir (LEVEMIR FLEXTOUCH) 100 UNIT/ML Pen Inject 50 Units into the skin daily. After breakfast 05/02/17  Yes Karamalegos, [name], DO  Insulin Pen Needle 31G X 5 MM MISC 31 g by Does not apply route as directed. 04/16/17  Yes [name], NP  ipratropium (ATROVENT) 0.06 % nasal spray Place 2 sprays into both nostrils 4 (four) times daily. 05/09/17  Yes [name], NP  isosorbide mononitrate (IMDUR) 60 MG 24 hr tablet Take 1 tablet (60 mg total) by mouth daily. 03/25/17  Yes End, [name], MD  levothyroxine (SYNTHROID, LEVOTHROID) 25 MCG tablet Take 25 mcg by mouth daily before breakfast.  10/27/15  Yes [provider]  levothyroxine (SYNTHROID, LEVOTHROID) 50 MCG tablet Take 50 mcg by mouth daily before breakfast.   Yes  [provider]  loperamide (IMODIUM A-D) 2 MG tablet Take 2 mg by mouth 3 (three) times daily as needed for diarrhea or loose stools.    Yes [provider]  magnesium oxide (MAG-OX) 400 MG tablet Take 400 mg by mouth 2 (two) times daily.   Yes [provider]  metoprolol succinate (TOPROL-XL) 50 MG 24 hr tablet Take 50 mg by mouth every morning. 12/04/15  Yes [provider]  Multiple Vitamins-Minerals (MULTIVITAMIN ADULTS 50+ PO) Take 1 tablet by mouth every morning.   Yes [provider]  nepafenac (NEVANAC) 0.1 % ophthalmic suspension Place 1 drop into both eyes 3 (three) times daily as needed.   Yes [provider]  nitroGLYCERIN (NITROSTAT) 0.4 MG SL tablet Place 1 tablet (0.4 mg total) under the tongue every 5 (five) minutes x 3 doses as needed for chest pain. 10/02/16  Yes End, [name], MD  ranitidine (ZANTAC) 300 MG tablet Take 300 mg by mouth daily.   Yes [provider]  Spacer/Aero-Holding [facility] ADVANTAGE) MISC 1 each by Other route once. 11/04/14  Yes Ahmed Prima, MD  tamsulosin (FLOMAX) 0.4 MG CAPS capsule Take 0.4 mg by mouth.   Yes [provider]  ticagrelor (BRILINTA) 90 MG TABS tablet Take 1 tablet (90 mg total) by mouth 2 (two) times daily. 04/21/17  Yes End, [name], MD  warfarin (COUMADIN) 2 MG tablet Take 3 mg by mouth at bedtime.    Yes [provider]  torsemide (DEMADEX) 20 MG tablet Take 2 tablets (40 mg total) by mouth daily. 05/13/17   Theora Gianotti, NP    Review of Systems    Still has dyspnea on exertion but slightly improved.  He denies chest pain, palpitations, PND, orthopnea, dizziness, syncope, edema.  He continues to note early satiety.  All other systems reviewed and are otherwise negative except as noted above.  Physical Exam    VS:  BP 112/68 (BP Location: Left Arm, Patient Position: Sitting, Cuff Size: Normal)   Pulse 72   Ht 5\' 5"  (1.651  m)   Wt 205 lb (93 kg)   BMI 34.11 kg/m  , BMI Body mass index is 34.11 kg/m. GEN: Well nourished, well developed, in no acute distress.  HEENT: normal.  Neck: Supple, JVP is difficult to gauge.  No carotid bruits, or masses. Cardiac: RRR, no murmurs, rubs, or gallops. No clubbing, cyanosis, edema.  Radials/DP/PT 2+ and equal bilaterally.  Respiratory:  Respirations regular and unlabored, clear to auscultation bilaterally. GI: Softer than a week ago, nontender, BS + x 4. MS: no deformity or atrophy. Skin: warm and dry, no rash. Neuro:  Strength and sensation are intact. Psych: Normal affect.  Accessory Clinical Findings    Lab Results  Component Value Date   CREATININE 1.97 (H) 05/13/2017   BUN 39 (H) 05/13/2017   NA 132 (L) 05/13/2017   K 4.5 05/13/2017   CL 98 (L) 05/13/2017   CO2 24 05/13/2017     Assessment & Plan    1.  Acute on chronic combined systolic and diastolic congestive heart failure/mixed ischemic and nonischemic cardia myopathy: EF 45-50% in May 2018.  He has been having progressive dyspnea since mid December which did not improve with change in Lasix dose.  Last week, I switched him to torsemide 40 mg daily times 3 days and then he dropped to 20 mg daily.  [name] down 4 pounds today.  His abdomen is slightly less firm today and he notes that his breathing has improved some.  He had a stat basic metabolic panel while here and creatinine is up slightly to 1.97.  I will have him continue on torsemide 20 mg daily.  I will plan to see him back next week in follow-up with repeat basic metabolic panel at that time.  He remains on beta-blocker and ACE inhibitor therapy.  2.  Coronary artery disease: He has not been having chest pain.  It remains a question as to whether or not dyspnea may be an anginal equivalent though he has had some improvement with diuresis.  He has known 70% left main stenosis and previously underwent RCA stenting in the spring 2018.  He was not felt to  be a candidate for bypass surgery.  We will continue medical therapy including aspirin, statin, beta-blocker, ACE inhibitor, Brilinta, and nitrate.  [name] not on aspirin in the setting of ongoing warfarin and Brilinta.  We may still need to consider high risk left main stenting if symptoms do not remained stable.  [name] a poor candidate for catheterization given chronic kidney disease and a creatinine that hovers around 2.  3.  Essential hypertension: Stable.  4.  Hyperlipidemia: LDL 54 in October 2018.  Continue statin therapy.  5.  Paroxysmal atrial fibrillation: [name] regular on exam.  He was in sinus when he was here a week ago.  Continue beta-blocker and Coumadin therapy.  6.  Iron deficiency anemia: Counts were stable in December.  7.  Type 2 diabetes mellitus: Inadequately controlled with an A1c of 9.6 on February 5.  Remains on insulin therapy and is followed by primary care.  8.  Stage IV chronic kidney disease: Followed by nephrology.  Creatinine slightly higher than last week.  Continuing current dose of torsemide.  9.  Disposition: Basic metabolic panel checked today.  I will see him back in 1 week with plan for labs @ that time.  [name], NP 05/13/2017, 2:47 PM

## 2017-05-13 NOTE — Patient Instructions (Signed)
Medication Instructions:  Your physician has recommended you make the following change in your medication:  1- INCREASE Torsemide to 40 mg by mouth once a day.   Labwork: Your physician recommends that you return for lab work in: [place] ([facility]).   Testing/Procedures: none  Follow-Up: Your physician recommends that you schedule a follow-up appointment in: [place].   If you need a refill on your cardiac medications before your next appointment, please call your pharmacy.

## 2017-05-14 ENCOUNTER — Other Ambulatory Visit: Payer: Self-pay | Admitting: *Deleted

## 2017-05-14 ENCOUNTER — Ambulatory Visit: Admit: 2017-05-14 | Discharge: 2017-05-15 | Payer: MEDICARE

## 2017-05-14 ENCOUNTER — Other Ambulatory Visit: Admit: 2017-05-14 | Discharge: 2017-05-15 | Payer: MEDICARE

## 2017-05-14 DIAGNOSIS — C9210 Chronic myeloid leukemia, BCR/ABL-positive, not having achieved remission: Secondary | ICD-10-CM

## 2017-05-14 DIAGNOSIS — I5022 Chronic systolic (congestive) heart failure: Principal | ICD-10-CM

## 2017-05-14 DIAGNOSIS — E119 Type 2 diabetes mellitus without complications: Secondary | ICD-10-CM | POA: Diagnosis not present

## 2017-05-14 DIAGNOSIS — I509 Heart failure, unspecified: Secondary | ICD-10-CM | POA: Diagnosis not present

## 2017-05-14 DIAGNOSIS — I2510 Atherosclerotic heart disease of native coronary artery without angina pectoris: Secondary | ICD-10-CM | POA: Diagnosis not present

## 2017-05-14 DIAGNOSIS — E039 Hypothyroidism, unspecified: Secondary | ICD-10-CM | POA: Diagnosis not present

## 2017-05-14 DIAGNOSIS — D509 Iron deficiency anemia, unspecified: Secondary | ICD-10-CM | POA: Diagnosis not present

## 2017-05-14 DIAGNOSIS — E7800 Pure hypercholesterolemia, unspecified: Secondary | ICD-10-CM | POA: Diagnosis not present

## 2017-05-14 DIAGNOSIS — Z9221 Personal history of antineoplastic chemotherapy: Secondary | ICD-10-CM | POA: Diagnosis not present

## 2017-05-14 DIAGNOSIS — N400 Enlarged prostate without lower urinary tract symptoms: Secondary | ICD-10-CM | POA: Diagnosis not present

## 2017-05-14 MED ORDER — ENALAPRIL MALEATE 5 MG TABLET
ORAL_TABLET | Freq: Every day | ORAL | 11 refills | 0 days
Start: 2017-05-14 — End: 2018-08-05

## 2017-05-14 MED ORDER — BOSUTINIB 100 MG TABLET: 100 mg | tablet | Freq: Every day | 6 refills | 0 days | Status: AC

## 2017-05-14 MED ORDER — BOSUTINIB 100 MG TABLET
ORAL_TABLET | Freq: Every day | ORAL | 6 refills | 0.00000 days | Status: CP
Start: 2017-05-14 — End: 2017-05-14

## 2017-05-14 MED ORDER — TORSEMIDE 20 MG PO TABS: 20.0000 mg | ORAL_TABLET | Freq: Every day | ORAL | 3 refills | Status: DC

## 2017-05-14 NOTE — Telephone Encounter (Signed)
Dr End signed necessary paperwork. Paperwork faxed to [place]. May take 2 weeks before response is received.

## 2017-05-15 ENCOUNTER — Encounter: Payer: Self-pay | Admitting: Nurse Practitioner

## 2017-05-21 ENCOUNTER — Other Ambulatory Visit
Admission: RE | Admit: 2017-05-21 | Discharge: 2017-05-21 | Disposition: A | Discharge status: Home / Self Care | Payer: Medicare HMO | Source: Ambulatory Visit | Attending: Nurse Practitioner | Admitting: Nurse Practitioner

## 2017-05-21 ENCOUNTER — Ambulatory Visit (INDEPENDENT_AMBULATORY_CARE_PROVIDER_SITE_OTHER): Payer: Medicare HMO | Admitting: Nurse Practitioner

## 2017-05-21 ENCOUNTER — Telehealth: Payer: Self-pay | Admitting: Nurse Practitioner

## 2017-05-21 ENCOUNTER — Encounter: Payer: Self-pay | Admitting: Nurse Practitioner

## 2017-05-21 ENCOUNTER — Ambulatory Visit (INDEPENDENT_AMBULATORY_CARE_PROVIDER_SITE_OTHER): Payer: Medicare HMO

## 2017-05-21 VITALS — BP 110/62 | HR 67 | Ht 65.0 in | Wt 206.8 lb

## 2017-05-21 DIAGNOSIS — D509 Iron deficiency anemia, unspecified: Secondary | ICD-10-CM

## 2017-05-21 DIAGNOSIS — Z7901 Long term (current) use of anticoagulants: Secondary | ICD-10-CM | POA: Diagnosis not present

## 2017-05-21 DIAGNOSIS — Z794 Long term (current) use of insulin: Secondary | ICD-10-CM | POA: Diagnosis not present

## 2017-05-21 DIAGNOSIS — I480 Paroxysmal atrial fibrillation: Secondary | ICD-10-CM | POA: Diagnosis not present

## 2017-05-21 DIAGNOSIS — I10 Essential (primary) hypertension: Secondary | ICD-10-CM

## 2017-05-21 DIAGNOSIS — N184 Chronic kidney disease, stage 4 (severe): Secondary | ICD-10-CM | POA: Diagnosis not present

## 2017-05-21 DIAGNOSIS — I5043 Acute on chronic combined systolic (congestive) and diastolic (congestive) heart failure: Secondary | ICD-10-CM

## 2017-05-21 DIAGNOSIS — N183 Chronic kidney disease, stage 3 unspecified: Secondary | ICD-10-CM

## 2017-05-21 DIAGNOSIS — I2510 Atherosclerotic heart disease of native coronary artery without angina pectoris: Secondary | ICD-10-CM

## 2017-05-21 DIAGNOSIS — E1122 Type 2 diabetes mellitus with diabetic chronic kidney disease: Secondary | ICD-10-CM

## 2017-05-21 LAB — CBC WITH DIFFERENTIAL/PLATELET
BASOS ABS: 0 10*3/uL (ref 0–0.1)
BASOS PCT: 1 %
Eosinophils Absolute: 0.2 10*3/uL (ref 0–0.7)
Eosinophils Relative: 3 %
HEMATOCRIT: 36.4 % — AB (ref 40.0–52.0)
HEMOGLOBIN: 11.9 g/dL — AB (ref 13.0–18.0)
Lymphocytes Relative: 24 %
Lymphs Abs: 1.7 10*3/uL (ref 1.0–3.6)
MCH: 27.6 pg (ref 26.0–34.0)
MCHC: 32.7 g/dL (ref 32.0–36.0)
MCV: 84.3 fL (ref 80.0–100.0)
MONOS PCT: 10 %
Monocytes Absolute: 0.7 10*3/uL (ref 0.2–1.0)
Neutro Abs: 4.3 10*3/uL (ref 1.4–6.5)
Neutrophils Relative %: 62 %
Platelets: 214 10*3/uL (ref 150–440)
RBC: 4.32 MIL/uL — AB (ref 4.40–5.90)
RDW: 16.2 % — ABNORMAL HIGH (ref 11.5–14.5)
WBC: 6.9 10*3/uL (ref 3.8–10.6)

## 2017-05-21 LAB — BASIC METABOLIC PANEL
Anion gap: 12 (ref 5–15)
BUN: 40 mg/dL — AB (ref 6–20)
CHLORIDE: 95 mmol/L — AB (ref 101–111)
CO2: 23 mmol/L (ref 22–32)
Calcium: 9.2 mg/dL (ref 8.9–10.3)
Creatinine, Ser: 2.29 mg/dL — ABNORMAL HIGH (ref 0.61–1.24)
GFR calc non Af Amer: 25 mL/min — ABNORMAL LOW (ref >60)
GFR, EST AFRICAN AMERICAN: 29 mL/min — AB (ref >60)
Glucose, Bld: 345 mg/dL — ABNORMAL HIGH (ref 65–99)
POTASSIUM: 3.8 mmol/L (ref 3.5–5.1)
SODIUM: 130 mmol/L — AB (ref 135–145)

## 2017-05-21 LAB — POCT INR: INR: 3

## 2017-05-21 NOTE — Patient Instructions (Signed)
Please continue taking 1.5 tablets daily.  Recheck INR in 6 weeks.

## 2017-05-21 NOTE — Patient Instructions (Signed)
Medication Instructions: - Your physician recommends that you continue on your current medications as directed. Please refer to the Current Medication list given to you today.  Labwork: - Your physician recommends that you have lab work today: BMP/ CBC  Procedures/Testing: - none ordered  Follow-Up: - Your physician recommends that you schedule a follow-up appointment in: 1 month with Dr. Saunders Revel   Any Additional Special Instructions Will Be Listed Below (If Applicable).     If you need a refill on your cardiac medications before your next appointment, please call your pharmacy.

## 2017-05-21 NOTE — Progress Notes (Signed)
Office Visit    Patient Name: Lucas Caldwell Date of Encounter: 05/21/2017  Primary Care Provider:  [name], DO Primary Cardiologist:  [name], MD  Chief Complaint    84 year old male with a history of CAD, mixed ischemic and nonischemic cardiomyopathy, hypertension, hyperlipidemia, type 2 diabetes mellitus, paroxysmal atrial fibrillation on chronic Coumadin, stage III chronic kidney disease, CML, iron deficiency anemia, BPH, and hypothyroidism who presents for follow-up in the setting of volume overload and dyspnea.  Past Medical History    Past Medical History:  Diagnosis Date  . Arthritis    "probably in his legs" (08/15/2016)  . Asthma   . Blind right eye   . BPH (benign prostatic hyperplasia)   . Breast asymmetry    Left breast is larger, present for several years.  [name] CAD (coronary artery disease)    a. Cath in the late 90's - reportedly ok;  b. 2014 s/p stenting x 2 @ [facility]; c 08/19/16 Cath/PCI with DES -> RCA, plan to treat LM 70% medically. Seen by surgery and felt to be too high risk for CABG.  . Chronic combined systolic (congestive) and diastolic (congestive) heart failure ([place])    a. Previously reduced EF-->50% by echo in 2012;  b. 06/2015 Echo: EF 50-55%  c. 07/2016 Echo: EF 45-50%.  . Chronic kidney disease (CKD), stage IV (severe) ([place])   . CML (chronic myelocytic leukemia) ([place])   . GERD (gastroesophageal reflux disease)   . Gout   . Hyperlipemia   . Hypertension   . Hypothyroidism   . Iron deficiency anemia   . Migraine    "in the 1960s" (08/15/2016)  . NICM (nonischemic cardiomyopathy) ([place])    a. Previously reduced EF-->50% by echo in 2012;  b. 06/2015 Echo: EF 50-55%, Gr2 DD, mild MR, mildly dil LA, Ao sclerosis, mild TR;  c. 07/2016 Echo: EF 45-50%, ? inf HK, mild MR, mildly dil LA.  [name] PAF (paroxysmal atrial fibrillation) ([facility])    a. ? Dx 2014-->s/p DCCV;  b. CHA2DS2VASc = 6-->chronic coumadin.  . Prostate cancer ([place])   . RBBB   .  Type II diabetes mellitus ([place])    Past Surgical History:  Procedure Laterality Date  . CATARACT EXTRACTION W/ INTRAOCULAR LENS  IMPLANT, BILATERAL Bilateral   . CORONARY ANGIOPLASTY WITH STENT PLACEMENT  09/2012   2 stents  . CORONARY STENT INTERVENTION N/A 08/19/2016   Procedure: Coronary Stent Intervention;  Surgeon: [name], MD;  Location: [facility];  Service: Cardiovascular;  Laterality: N/A;  . ESOPHAGOGASTRODUODENOSCOPY N/A 12/20/2016   Procedure: ESOPHAGOGASTRODUODENOSCOPY (EGD);  Surgeon: Lin Landsman, MD;  Location: [facility] ENDOSCOPY;  Service: Gastroenterology;  Laterality: N/A;  . HERNIA REPAIR     "navel"  . LEFT HEART CATH AND CORONARY ANGIOGRAPHY N/A 08/14/2016   Procedure: Left Heart Cath and Coronary Angiography;  Surgeon: [name], MD;  Location: [facility];  Service: Cardiovascular;  Laterality: N/A;  . TOE AMPUTATION Right    "big toe"    Allergies  Allergies  Allergen Reactions  . Clopidogrel Anaphylaxis    Other reaction(s): ANAPHYLAXIS;altered mental status;  (electrolytes "out of wack" and confusion per family; THEY DO NOT REMEMBER ANY OTHER REACTION)- MSB 10/10/15  . Ciprofloxacin Itching  . Spironolactone     Other reaction(s): Other (See Comments) gynecomastia    History of Present Illness    84 year old ?  with the above complex past medical history.  He has a history  of nonischemic cardiomyopathy dating back to the late 1990s.  He was later diagnosed with coronary artery disease and underwent stenting x2 at [facility] in 2014.  LV function normalized by echo in April 2017.  In May 2018, he was admitted to [facility] with progressive dyspnea.  Echo showed EF of 45-50%.  Catheterization showed 70% left main disease with severe right coronary artery disease.  He was transferred to [facility] and seen by CT surgery but felt to be too high risk for surgery.  He subsequently underwent successful PCI and drug-eluting stent placement  to the right coronary artery.  Left main disease has been medically managed.  Following PCI, he did recently well as an outpatient but was admitted to [facility] in October 2018 with dysphasia and acute on chronic anemia.  Upper endoscopy revealed a Schatzki's ring and gastric erosion with stigmata of recent bleeding.  He has remained on warfarin and Brilinta therapy and has had stable hemoglobins.  [name] on iron for iron deficiency anemia.  He has been seen twice in clinic over the past few weeks secondary to persistent dyspnea on exertion dating back to mid December with variable response to escalation of Lasix dosing in the outpatient setting.  His weight on February 20 was 209 pounds, up from 196 in October 2018.  At that time, I switched him from Lasix to torsemide and when I saw him back on February 26, weight was down 4 pounds, and he did have some improvement in degree of dyspnea.  Follow-up creatinine that day was mildly elevated above baseline at 1.97.  I had him continue torsemide 20 mg daily with plan for follow-up today.  Unfortunately, he misunderstood the instructions related to his torsemide dosing and took 2 tablets daily for a total of 40 mg.  In that setting, his weight has been stable at home and he has noted reduction in abdominal distention and improvement in exercise tolerance.  He says he can now walk approximately 100 yards prior to experiencing dyspnea, which is much better than before.  He denies chest pain, palpitations, PND, orthopnea, syncope, edema, or early satiety.  He has noticed lightheadedness if he stands up too quickly.    Home Medications    Prior to Admission medications   Medication Sig Start Date End Date Taking? Authorizing Provider  acetaminophen (TYLENOL) 500 MG tablet Take 500 mg by mouth every 6 (six) hours as needed.    [provider]  albuterol (PROVENTIL HFA;VENTOLIN HFA) 108 (90 Base) MCG/ACT inhaler Inhale 2 puffs into the lungs every 6  (six) hours as needed for wheezing or shortness of breath. 05/13/17   Theora Gianotti, NP  Alcohol Swabs 70 % PADS Use when checking blood sugar up to 3x daily 04/09/17   Parks Ranger, [name], DO  allopurinol (ZYLOPRIM) 100 MG tablet Take 100 mg by mouth daily. 02/03/17   [provider]  atorvastatin (LIPITOR) 80 MG tablet Take 1 tablet (80 mg total) by mouth daily at 6 PM. 08/20/16   Cheryln Manly, NP  benzonatate (TESSALON) 100 MG capsule Take 1 capsule (100 mg total) by mouth 2 (two) times daily as needed for cough. 05/09/17   [name], NP  bosutinib (BOSULIF) 100 MG tablet Take 200 mg by mouth at bedtime. Take with food.     [provider]  enalapril (VASOTEC) 5 MG tablet Take 5 mg by mouth daily.    [provider]  famotidine (PEPCID) 20 MG tablet  Take 20 mg by mouth at bedtime as needed for heartburn. Take 2 hours after chemo pill.    [provider]  fenofibrate 160 MG tablet Take 1 tablet (160 mg total) by mouth daily. 08/21/16   Cheryln Manly, NP  ferrous sulfate 325 (65 FE) MG EC tablet Take 1 tablet (325 mg total) by mouth 3 (three) times daily with meals. 01/08/17   End, [name], MD  gabapentin (NEURONTIN) 300 MG capsule Take 1 capsule (300 mg total) by mouth 2 (two) times daily. Start with 1 daily for 1-2 weeks then increase to 2 04/22/17   Karamalegos, [name], DO  hydrocortisone 2.5 % ointment Apply topically 3 (three) times daily. 07/20/16   Triplett, Cari B, FNP  Insulin Detemir (LEVEMIR FLEXTOUCH) 100 UNIT/ML Pen Inject 50 Units into the skin daily. After breakfast 05/02/17   Karamalegos, [name], DO  Insulin Pen Needle 31G X 5 MM MISC 31 g by Does not apply route as directed. 04/16/17   [name], NP  ipratropium (ATROVENT) 0.06 % nasal spray Place 2 sprays into both nostrils 4 (four) times daily. 05/09/17   [name], NP  isosorbide mononitrate (IMDUR) 60 MG 24 hr tablet Take 1 tablet  (60 mg total) by mouth daily. 03/25/17   End, [name], MD  levothyroxine (SYNTHROID, LEVOTHROID) 25 MCG tablet Take 25 mcg by mouth daily before breakfast.  10/27/15   [provider]  levothyroxine (SYNTHROID, LEVOTHROID) 50 MCG tablet Take 50 mcg by mouth daily before breakfast.    [provider]  loperamide (IMODIUM A-D) 2 MG tablet Take 2 mg by mouth 3 (three) times daily as needed for diarrhea or loose stools.     [provider]  magnesium oxide (MAG-OX) 400 MG tablet Take 400 mg by mouth 2 (two) times daily.    [provider]  metoprolol succinate (TOPROL-XL) 50 MG 24 hr tablet Take 50 mg by mouth every morning. 12/04/15   [provider]  Multiple Vitamins-Minerals (MULTIVITAMIN ADULTS 50+ PO) Take 1 tablet by mouth every morning.    [provider]  nepafenac (NEVANAC) 0.1 % ophthalmic suspension Place 1 drop into both eyes 3 (three) times daily as needed.    [provider]  nitroGLYCERIN (NITROSTAT) 0.4 MG SL tablet Place 1 tablet (0.4 mg total) under the tongue every 5 (five) minutes x 3 doses as needed for chest pain. 10/02/16   End, [name], MD  ranitidine (ZANTAC) 300 MG tablet Take 300 mg by mouth daily.    [provider]  Spacer/Aero-Holding Chambers [facility] ADVANTAGE) MISC 1 each by Other route once. 11/04/14   Ahmed Prima, MD  tamsulosin (FLOMAX) 0.4 MG CAPS capsule Take 0.4 mg by mouth.    [provider]  ticagrelor (BRILINTA) 90 MG TABS tablet Take 1 tablet (90 mg total) by mouth 2 (two) times daily. 04/21/17   End, [name], MD  torsemide (DEMADEX) 20 MG tablet Take 1 tablet (20 mg total) by mouth daily. 05/14/17   Theora Gianotti, NP  warfarin (COUMADIN) 2 MG tablet Take 3 mg by mouth at bedtime.     [provider]    Review of Systems    He still has dyspnea on exertion though this is significantly improved.  Reduced abdominal girth.  He does experience  lightheadedness when he stands up quickly.  He has been studying himself with a cane.  He has not had any chest pain, palpitations, PND, orthopnea, syncope, edema,  or early satiety.  All other systems reviewed and are otherwise negative except as noted above.  Physical Exam    VS:  BP 110/62 (BP Location: Left Wrist, Patient Position: Sitting, Cuff Size: Normal)   Pulse 67   Ht 5\' 5"  (1.651 m)   Wt 206 lb 12 oz (93.8 kg)   BMI 34.41 kg/m  , BMI Body mass index is 34.41 kg/m. GEN: Well nourished, well developed, in no acute distress.  HEENT: normal.  Neck: Supple, no JVD, carotid bruits, or masses. Cardiac: RRR, no murmurs, rubs, or gallops. No clubbing, cyanosis, edema.  Radials/DP/PT 2+ and equal bilaterally.  Respiratory:  Respirations regular and unlabored, scattered rhonchi bilaterally. GI: Soft, nontender, nondistended, BS + x 4. MS: no deformity or atrophy. Skin: warm and dry, no rash. Neuro: Awake, alert, and oriented x3.  Strength and sensation are intact. Psych: Normal affect.  Accessory Clinical Findings     Lab Results  Component Value Date   CREATININE 1.97 (H) 05/13/2017   BUN 39 (H) 05/13/2017   NA 132 (L) 05/13/2017   K 4.5 05/13/2017   CL 98 (L) 05/13/2017   CO2 24 05/13/2017    Assessment & Plan    1.  Acute on chronic combined systolic and diastolic congestive heart failure/mixed ischemic and nonischemic cardiomyopathy: EF 45-50% in May 2018.  I have seen him weekly over the past 3 weeks in the setting of volume overload.  2 weeks ago, I switched him over to torsemide and have had modest success with weight reduction in symptomatic improvement.  He does not have any significant volume overload on exam today.  His abdomen is much softer than it was 2 weeks ago.  His weight is down to 206 fully clothed on our scale.  He did not weigh himself at home this morning.  His exercise tolerance is much better.  At his last visit, we obtained a basic metabolic panel and  his creatinine was up to 1.97.  We had initially discussed increasing torsemide to 40 mg daily but after seeing labs, we recommended continuation at 20 mg daily.  Unfortunately, patient misunderstood and has been taking 40 mg daily.  I will follow-up a basic metabolic panel today and have asked him to drop down to 20 mg daily.  He remains on beta-blocker and ACE inhibitor therapy.  2.  Coronary artery disease: He has not been having any chest pain as above, dyspnea has improved some.  He feels as though he is able to walk about 100 yards prior to experiencing dyspnea.  He has known 70% left main stenosis and previously underwent RCA stenting in the spring 2018.  [name] not felt to be a candidate for bypass surgery and would also be a high risk left main PCI if it were required.  Symptoms have improved with diuresis, would plan to continue medical therapy including statin, beta-blocker, ACE inhibitor, Brilinta, and nitrate.  No aspirin in the setting of ongoing warfarin and Brilinta.  3.  Essential hypertension: Stable.  Next  4.  Hyperlipidemia: LDL 54 in October 2018.  Continue statin therapy.  5.  Paroxysmal atrial fibrillation: [name] regular on exam.  Continue beta-blocker and Coumadin therapy. INR today to be performed by coumadin clinic nurse.  6.  Iron deficiency anemia: Blood count was stable in December.  We will follow-up today in the setting of orthostasis and dyspnea on exertion.  7.  Type 2 diabetes mellitus: Inadequately controlled w/ an A1c of 9.6  on 04/22/17.  Remains on insulin therapy and is followed by primary care.  8.  Stage IV chronic kidney disease: Followed by nephrology.  Creatinine was higher last week and patient unfortunately has been taking 40 mill grams of torsemide instead of 20.  I am dropping him back to 20 today and following up a basic metabolic panel.  9.  Disposition: Overall improved.  CBC and basic metabolic panel today.  He has follow-up with Dr. Saunders Revel in  approximately 3 weeks.  [name], NP 05/21/2017, 2:42 PM

## 2017-05-21 NOTE — Telephone Encounter (Signed)
The patient's wife is aware of his lab results (ok per DPR). She voices understanding to decrease torsemide to 20 mg once daily & repeat his BMP next week. I have advised her that he should also follow up with his PCP regarding elevated glucose readings.

## 2017-05-27 ENCOUNTER — Encounter: Payer: Self-pay | Admitting: Family Medicine

## 2017-05-27 ENCOUNTER — Telehealth: Payer: Self-pay | Admitting: Family Medicine

## 2017-05-27 NOTE — Telephone Encounter (Signed)
Received form via fax for Diabetic Shoes. Completed certifying physician statement today, 05/27/17. Last seen 04/22/17, last DM foot exam was 01/22/18, within 6 months of today's date. Copy of last 2 notes attached, to be faxed.  [name], [facility] 05/27/2017, 6:04 PM

## 2017-05-27 NOTE — Telephone Encounter (Signed)
Pt needs an order for diabetic shoes [phone]

## 2017-05-27 NOTE — Progress Notes (Signed)
Received form via fax for Diabetic Shoes. Completed certifying physician statement today, 05/27/17. Last seen 04/22/17, last DM foot exam was 01/22/18, within 6 months of today's date. Copy of last 2 notes attached, to be faxed.  [name], [facility] 05/27/2017, 6:03 PM

## 2017-05-28 ENCOUNTER — Other Ambulatory Visit
Admission: RE | Admit: 2017-05-28 | Discharge: 2017-05-28 | Disposition: A | Discharge status: Home / Self Care | Payer: Medicare HMO | Source: Ambulatory Visit | Attending: Nurse Practitioner | Admitting: Nurse Practitioner

## 2017-05-28 DIAGNOSIS — N183 Chronic kidney disease, stage 3 unspecified: Secondary | ICD-10-CM

## 2017-05-28 LAB — BASIC METABOLIC PANEL
Anion gap: 10 (ref 5–15)
BUN: 27 mg/dL — AB (ref 6–20)
CHLORIDE: 103 mmol/L (ref 101–111)
CO2: 24 mmol/L (ref 22–32)
Calcium: 9 mg/dL (ref 8.9–10.3)
Creatinine, Ser: 1.73 mg/dL — ABNORMAL HIGH (ref 0.61–1.24)
GFR calc Af Amer: 41 mL/min — ABNORMAL LOW (ref >60)
GFR calc non Af Amer: 35 mL/min — ABNORMAL LOW (ref >60)
GLUCOSE: 219 mg/dL — AB (ref 65–99)
POTASSIUM: 3.8 mmol/L (ref 3.5–5.1)
Sodium: 137 mmol/L (ref 135–145)

## 2017-05-30 ENCOUNTER — Telehealth: Payer: Self-pay | Admitting: Nurse Practitioner

## 2017-05-30 NOTE — Telephone Encounter (Signed)
Difficult to assess given he has not been weighing himself at home. Patient needs to obtain batteries for his scale today, weigh, and call with these readings for [place] to better assess his volume status.

## 2017-05-30 NOTE — Telephone Encounter (Signed)
Pt wife calling stating they were told to call us today after patient doubled up on Fluid pills pt took the two fluid pills but it didn't do much he states Only went to the restroom twice since then and not much came   [name] wanting to know what to do since it is not helping   Please advise

## 2017-05-30 NOTE — Telephone Encounter (Signed)
Will forward to [place], [place] to review.  Notes from yesterday's discussion with the patient and his wife attached below.    Notes recorded by Rise Mu, PA-C on 05/29/2017 at 12:59 PM EDT Agree with the below. ------  Notes recorded by Emily Filbert, RN on 05/29/2017 at 12:20 PM EDT I called and spoke with the patient and his wife both- verified with patient directly that his scale is currently without batteries and that he has not been weighing at home.  Stressed the importance with both the patient and his wife to get a working scale as soon as possible as this will help [place] determine if/ how much fluid he may be retaining. The patient states he is SOB and feels this is worse. I advised him and his wife and go ahead and have him take an extra 20 mg of torsemide now and call us back in the morning of his SOB is no better. I explained to the patient's wife that we are trying to find a balance when treating his SOB/ edema with diuretics while also trying to preserve his kidney function. She voices understanding.  Will forward to [place], [place] as an [place]. ------  Notes recorded by Rise Mu, PA-C on 05/29/2017 at 11:28 AM EDT Noted. Please continue torsemide 20 mg daily. Should he note a 3 pound weight gain overnight or 5 pound in a 7 day period he can take one extra torsemide for a total of 40 mg x 1 only, then resume 20 mg daily the following day. Keep planned follow up with Dr. Saunders Revel. ------  Notes recorded by Emily Filbert, RN on 05/29/2017 at 10:36 AM EDT I spoke with the patient's wife (ok per [facility]) regarding his lab results. She states his weights are running "about the same" at home although she could not give me specific numbers. She reports they are similar to when he was in the office and his weight was 206 lbs. She does report he is still SOB when walking short distances and sometimes at rest- she states symptoms vary from day to day, but she does not feel like he is any worse at  this point. I advised her to call back should his symptoms worsen- will forward to [name], NP as an [place]. ------  Notes recorded by Theora Gianotti, NP on 05/28/2017 at 4:10 PM EDT Kidney function improved back to prior baseline. Please inquire as to how his fluid status and weight are doing.

## 2017-05-30 NOTE — Telephone Encounter (Signed)
I called the patient and his wife back today- per their report, the patient did get replacement batteries for his scale today. His weight was 206 lbs today. It was very difficult to hear the patient and his wife as they were driving in the car. They asked that I call back in about 10 minutes since they will be at their destination.

## 2017-05-30 NOTE — Telephone Encounter (Signed)
I called and spoke with Mrs. Filippi- she states that Mr. [place] has had his "cancer" pill adjusted recently- he was taking 2 pills and then this was decreased to 1 pill, he started feeling and this was increased back to 2 pills today and he is feeling better.  I have advised Mrs. Mclees to have the patient continue to weigh daily- if his weight goes up 3 lbs or more in 24 hours or 5 lbs in a week, then he may take an extra 20 mg of torsemide at that time.  Otherwise, he should be on torsemide 20 mg once daily.  She voices understanding of the above or to call us should Mr. Rossetti become more SOB.

## 2017-06-06 ENCOUNTER — Ambulatory Visit: Payer: Medicare HMO | Admitting: Family Medicine

## 2017-06-06 ENCOUNTER — Telehealth: Payer: Self-pay | Admitting: Internal Medicine

## 2017-06-06 MED ORDER — TICAGRELOR 90 MG PO TABS: 90.0000 mg | ORAL_TABLET | Freq: Two times a day (BID) | ORAL | 3 refills | Status: DC

## 2017-06-06 NOTE — Telephone Encounter (Signed)
Patient calling the office for samples of medication:   1.  What medication and dosage are you requesting samples for? Brilinta 90 mg po BID   2.  Are you currently out of this medication? Yes  In office waiting for samples and to send in an rx to [facility]

## 2017-06-06 NOTE — Telephone Encounter (Signed)
Provided samples of Brilinta 90 mg gave one bottle of of Lot # [id number] Exp. 07/2019 and gave one bottle of  Lot # [id number] Exp. 9/21.

## 2017-06-06 NOTE — Addendum Note (Signed)
Addended by: Anselm Pancoast on: 06/06/2017 11:04 AM   Modules accepted: Orders

## 2017-06-11 ENCOUNTER — Ambulatory Visit: Payer: Medicare HMO | Admitting: Internal Medicine

## 2017-06-11 ENCOUNTER — Ambulatory Visit: Admit: 2017-06-11 | Discharge: 2017-06-11 | Payer: MEDICARE

## 2017-06-11 ENCOUNTER — Other Ambulatory Visit: Admit: 2017-06-11 | Discharge: 2017-06-11 | Payer: MEDICARE

## 2017-06-11 ENCOUNTER — Ambulatory Visit: Admit: 2017-06-11 | Discharge: 2017-06-11 | Payer: MEDICARE | Attending: Oncology | Primary: Oncology

## 2017-06-11 DIAGNOSIS — C9210 Chronic myeloid leukemia, BCR/ABL-positive, not having achieved remission: Principal | ICD-10-CM

## 2017-06-11 DIAGNOSIS — Z79899 Other long term (current) drug therapy: Secondary | ICD-10-CM | POA: Diagnosis not present

## 2017-06-11 DIAGNOSIS — R197 Diarrhea, unspecified: Secondary | ICD-10-CM | POA: Diagnosis not present

## 2017-06-11 DIAGNOSIS — M7918 Myalgia, other site: Secondary | ICD-10-CM | POA: Diagnosis not present

## 2017-06-11 DIAGNOSIS — R42 Dizziness and giddiness: Secondary | ICD-10-CM | POA: Diagnosis not present

## 2017-06-11 DIAGNOSIS — K5900 Constipation, unspecified: Secondary | ICD-10-CM | POA: Diagnosis not present

## 2017-06-11 DIAGNOSIS — Z794 Long term (current) use of insulin: Secondary | ICD-10-CM | POA: Diagnosis not present

## 2017-06-11 DIAGNOSIS — R0602 Shortness of breath: Secondary | ICD-10-CM | POA: Diagnosis not present

## 2017-06-11 DIAGNOSIS — E109 Type 1 diabetes mellitus without complications: Secondary | ICD-10-CM | POA: Diagnosis not present

## 2017-06-13 ENCOUNTER — Encounter: Payer: Self-pay | Admitting: Family Medicine

## 2017-06-13 ENCOUNTER — Ambulatory Visit (INDEPENDENT_AMBULATORY_CARE_PROVIDER_SITE_OTHER): Payer: Medicare HMO | Admitting: Family Medicine

## 2017-06-13 VITALS — BP 110/44 | HR 66 | Temp 98.2°F | Resp 16 | Ht 65.0 in | Wt 211.6 lb

## 2017-06-13 DIAGNOSIS — E1142 Type 2 diabetes mellitus with diabetic polyneuropathy: Secondary | ICD-10-CM

## 2017-06-13 DIAGNOSIS — J449 Chronic obstructive pulmonary disease, unspecified: Secondary | ICD-10-CM

## 2017-06-13 DIAGNOSIS — E1121 Type 2 diabetes mellitus with diabetic nephropathy: Secondary | ICD-10-CM | POA: Diagnosis not present

## 2017-06-13 DIAGNOSIS — IMO0002 Reserved for concepts with insufficient information to code with codable children: Secondary | ICD-10-CM

## 2017-06-13 DIAGNOSIS — J432 Centrilobular emphysema: Secondary | ICD-10-CM | POA: Insufficient documentation

## 2017-06-13 DIAGNOSIS — I5022 Chronic systolic (congestive) heart failure: Secondary | ICD-10-CM | POA: Diagnosis not present

## 2017-06-13 DIAGNOSIS — E1165 Type 2 diabetes mellitus with hyperglycemia: Secondary | ICD-10-CM

## 2017-06-13 MED ORDER — FLUTICASONE FUROATE-VILANTEROL 100-25 MCG/INH IN AEPB: 1.0000 | INHALATION_SPRAY | Freq: Every day | RESPIRATORY_TRACT | 5 refills | Status: DC

## 2017-06-13 NOTE — Progress Notes (Addendum)
Subjective:    Patient ID: Lucas Caldwell, male    DOB: [birth year], 84 y.o.   MRN: [id number]  Lucas Caldwell is a 84 y.o. male presenting on 06/13/2017 for diabetic shoe and COPD   HPI   Suspected COPD / Chronic Dyspnea on exertion Reports gradual recent worsening of shortness of breath in general mostly only with exertion, even short distances, uncertain timeline >9-52 months, recent worse. Was told had "tight lungs" by other doctor, concern with his breathing, he has history of smoking quit in 1979, see below. No formal dx COPD, never on inhalers regularly, he has been on Albuterol PRN in past. Using most days now some relief only temporary - Other factors he has CHF and followed by Cardiology, among other cardiac disease with CAD and cardiac stent in June 2018 that he thinks has not fully recovered from. Additionally he is on iron pill daily asking about this, recent hemoglobin has improved - Asking about other inhalers or treatment for COPD - Denies any cough productive, wheezing, fever chills, dyspnea at rest  Diabetes Type 2 uncontrolled with DM Neuropathy Has had elevated A1c despite therapy, on insulin management. He has complications with CKD and neuropathy. Today not focus for DM visit. Not due for a1c [name] requesting diabetic shoes, he went to [place] supply recently, placed order and requested [place] to complete paperwork, this was done on 05/27/17, it was sent via fax and he has not heard back yet. He has chronic neuropathy with poor reduced sensation to monofilament He needs diabetic shoes due to his neuropathy Taking Lyrica  Denies any hypoglycemia   Depression screen [facility] 2/9 06/13/2017 04/22/2017 01/22/2017  Decreased Interest 0 0 0  Down, Depressed, Hopeless 0 0 0  PHQ - 2 Score 0 0 0    Social History   Tobacco Use  . Smoking status: Former Smoker    Packs/day: 2.00    Years: 15.00    Pack years: 30.00    Types: Cigarettes  . Smokeless tobacco: Former Systems developer    . Tobacco comment: quit 1979  Substance Use Topics  . Alcohol use: No    Comment: previously drank heavily - quit 1979.  . Drug use: No    Review of Systems Per HPI unless specifically indicated above     Objective:    BP (!) 110/44   Pulse 66   Temp 98.2 F (36.8 C) (Oral)   Resp 16   Ht 5\' 5"  (1.651 m)   Wt 211 lb 9.6 oz (96 kg)   SpO2 100%   BMI 35.21 kg/m   Wt Readings from Last 3 Encounters:  06/13/17 211 lb 9.6 oz (96 kg)  05/21/17 206 lb 12 oz (93.8 kg)  05/13/17 205 lb (93 kg)    Physical Exam  Constitutional: [name] oriented to person, place, and time. He appears well-developed and well-nourished. No distress.  Mostly well appearing 84 year old male, comfortable, cooperative  HENT:  Head: Normocephalic and atraumatic.  Mouth/Throat: Oropharynx is clear and moist.  Eyes: Conjunctivae are normal. Right eye exhibits no discharge. Left eye exhibits no discharge.  Neck: Normal range of motion. Neck supple.  Cardiovascular: Normal rate, regular rhythm, normal heart sounds and intact distal pulses.  No murmur heard. Pulmonary/Chest: Effort normal and breath sounds normal. No respiratory distress. He has no wheezes. He has no rales.  Some slightly reduced air movement diffusely, compared to prior exams. No focal crackles or wheezing. Some slight coarse  breath sounds lower bases. No coughing.  Musculoskeletal: Normal range of motion. He exhibits no edema.  Lymphadenopathy:    He has no cervical adenopathy.  Neurological: [name] alert and oriented to person, place, and time.  Skin: Skin is warm and dry. No rash noted. [name] not diaphoretic. No erythema.  Psychiatric: He has a normal mood and affect. His behavior is normal.  Well groomed, good eye contact, normal speech and thoughts  Nursing note and vitals reviewed.  Diabetic Foot Exam - Simple   Simple Foot Form Diabetic Foot exam was performed with the following findings:  Yes 06/13/2017  4:00 PM  Visual  Inspection See comments:  Yes Sensation Testing See comments:  Yes Pulse Check Posterior Tibialis and Dorsalis pulse intact bilaterally:  Yes Comments Unchanged, still significantly reduced bilateral foot monofilament testing plantar and dorsal surfaces, has intact light touch but only very minimal sensation to monofilament in distinct areas such as medial arch. He has some deformity with callus formation on feet.       Results for orders placed or performed during the hospital encounter of 71/06/26  Basic metabolic panel  Result Value Ref Range   Sodium 137 135 - 145 mmol/L   Potassium 3.8 3.5 - 5.1 mmol/L   Chloride 103 101 - 111 mmol/L   CO2 24 22 - 32 mmol/L   Glucose, Bld 219 (H) 65 - 99 mg/dL   BUN 27 (H) 6 - 20 mg/dL   Creatinine, Ser 1.73 (H) 0.61 - 1.24 mg/dL   Calcium 9.0 8.9 - 10.3 mg/dL   GFR calc non Af Amer 35 (L) >60 mL/min   GFR calc Af Amer 41 (L) >60 mL/min   Anion gap 10 5 - 15      Assessment & Plan:   Problem List Items Addressed This Visit    Chronic systolic CHF (congestive heart failure) (HCC)    Clinically seems euvolemic, less likely cause of dyspnea on exertion      COPD (chronic obstructive pulmonary disease) (HCC)    Suspect main etiology for dyspnea, however multifactorial with age, CAD, CKD, CHF Concern inadequately treated only on short acting albuterol, over using Former smoker, now quit >40 years, but still likely factor for some of his symptoms  Plan Trial on Breo combination therapy for daily maintenance - checked ins cost/coverage per plan Continue Albuterol - use less often Future follow-up if not improving, consider change of inhaler vs refer to Pulm for PFTs and eval, may need 6 min walk test to determine if oxygen desat with his exertion as next step, also overnight oximetry is possible Follow-up with cardiology for other causes if not improving on pulm treatment      Relevant Medications   fluticasone furoate-vilanterol (BREO  ELLIPTA) 100-25 MCG/INH AEPB   Diabetic neuropathy associated with type 2 diabetes mellitus ([place]) - Primary    Concern with chronic neuropathy, moderate to severe bilateral On Lyrica Will benefit from Diabetic Shoes for this condition as it is medically necessary to reduce complications Have previously completed order form Repeat foot exam today is unchanged from previous Advised to check with [facility] supply if not received can complete form again      Uncontrolled type 2 diabetes mellitus with nephropathy ([place])    Cause of underlying neuropathy Uncontrolled Will return for DM visit and re-check A1c, further manage at that time         Meds ordered this encounter  Medications  . fluticasone furoate-vilanterol (  BREO ELLIPTA) 100-25 MCG/INH AEPB    Sig: Inhale 1 puff into the lungs daily.    Dispense:  28 each    Refill:  5   UPDATE 06/16/17 - received copy of DM Shoe paperwork from [place] again today 06/16/17, requesting physician certification, qualifying diagnosis, and order form completed. - Dx: Peripheral neuropathy w/ callus formation - ICD10: Diabetes code = E11.21, E11.65 - uncontrolled type 2 diabetes w/ neuropathy - Rx for peripheral neuropathy with pre ulcerative callus - 1 pair of shoes and 1 pair of inserts, custom molded, may authorize additional pairs by patient request  Follow up plan: Return in about 6 weeks (around 07/22/2017) for keep apt as scheduled, DM A1c, COPD.  [name], [facility] 06/16/2017, 5:27 PM

## 2017-06-13 NOTE — Patient Instructions (Addendum)
Thank you for coming to the office today.  I'm concerned you have a mild form of COPD, probably from history of smoking.  Sent new rx Breo inhaler (or generic) this is 1 puff daily - EVERY DAY it is a maintenance inhaler  The goal is to have less symptoms of short of breath and able to breathe better, and use Albuterol rescue inhaler less, you can still use it if you need but only use it if you have flare up  If still not improving or any sudden worsening short of breath fever coughing may need to go to hospital for evaluation more promptly ---------  We submitted the Diabetic Shoe Form to them via fax on 05/27/17 - check with [facility] to see if they received it and what to do next. If it is still missing, perhaps they can send [place] another copy and we can try again.  Please schedule a Follow-up Appointment to: Return in about 6 weeks (around 07/22/2017) for keep apt as scheduled, DM A1c, COPD.  If you have any other questions or concerns, please feel free to call the office or send a message through [facility]. You may also schedule an earlier appointment if necessary.  Additionally, you may be receiving a survey about your experience at our office within a few days to 1 week by e-mail or mail. We value your feedback.  [name], DO [place]

## 2017-06-14 NOTE — Assessment & Plan Note (Addendum)
Concern with chronic neuropathy, moderate to severe bilateral On Lyrica Will benefit from Diabetic Shoes for this condition as it is medically necessary to reduce complications Have previously completed order form Repeat foot exam today is unchanged from previous Advised to check with [facility] supply if not received can complete form again

## 2017-06-14 NOTE — Assessment & Plan Note (Signed)
Clinically seems euvolemic, less likely cause of dyspnea on exertion

## 2017-06-14 NOTE — Assessment & Plan Note (Signed)
Suspect main etiology for dyspnea, however multifactorial with age, CAD, CKD, CHF Concern inadequately treated only on short acting albuterol, over using Former smoker, now quit >40 years, but still likely factor for some of his symptoms  Plan Trial on Breo combination therapy for daily maintenance - checked ins cost/coverage per plan Continue Albuterol - use less often Future follow-up if not improving, consider change of inhaler vs refer to Pulm for PFTs and eval, may need 6 min walk test to determine if oxygen desat with his exertion as next step, also overnight oximetry is possible Follow-up with cardiology for other causes if not improving on pulm treatment

## 2017-06-14 NOTE — Assessment & Plan Note (Signed)
Cause of underlying neuropathy Uncontrolled Will return for DM visit and re-check A1c, further manage at that time

## 2017-06-17 DIAGNOSIS — L97409 Non-pressure chronic ulcer of unspecified heel and midfoot with unspecified severity: Secondary | ICD-10-CM | POA: Diagnosis not present

## 2017-06-17 DIAGNOSIS — E1121 Type 2 diabetes mellitus with diabetic nephropathy: Secondary | ICD-10-CM | POA: Diagnosis not present

## 2017-06-18 ENCOUNTER — Ambulatory Visit: Payer: Medicare HMO | Admitting: Internal Medicine

## 2017-06-19 ENCOUNTER — Telehealth: Payer: Self-pay | Admitting: Family Medicine

## 2017-06-19 ENCOUNTER — Other Ambulatory Visit: Payer: Self-pay | Admitting: Family Medicine

## 2017-06-19 DIAGNOSIS — E1121 Type 2 diabetes mellitus with diabetic nephropathy: Secondary | ICD-10-CM

## 2017-06-19 DIAGNOSIS — IMO0002 Reserved for concepts with insufficient information to code with codable children: Secondary | ICD-10-CM

## 2017-06-19 DIAGNOSIS — E1165 Type 2 diabetes mellitus with hyperglycemia: Principal | ICD-10-CM

## 2017-06-19 MED ORDER — INSULIN DETEMIR 100 UNIT/ML FLEXPEN: 50.0000 [IU] | PEN_INJECTOR | Freq: Every day | SUBCUTANEOUS | 3 refills | Status: DC

## 2017-06-19 NOTE — Telephone Encounter (Signed)
Pt. Called requesting refill on  Lantus  Solo star 50 Units called into [facility] the patient call back # [phone] Lantus was changed to Levemir in 02/19

## 2017-06-19 NOTE — Telephone Encounter (Signed)
Rx send for approval.

## 2017-06-19 NOTE — Telephone Encounter (Signed)
Pt. Called requesting refill on  Lantus  Solo star 50 Units called into [facility] the patient call back # [phone]

## 2017-06-23 ENCOUNTER — Telehealth: Payer: Self-pay | Admitting: Family Medicine

## 2017-06-23 DIAGNOSIS — E1142 Type 2 diabetes mellitus with diabetic polyneuropathy: Secondary | ICD-10-CM

## 2017-06-23 NOTE — Telephone Encounter (Signed)
Lucas Caldwell has a question about pt's insulin medication [phone] or [phone]

## 2017-06-23 NOTE — Telephone Encounter (Signed)
The pt wife came by the office complaining about the cost of hr husband Levemir. She stated she contacted her [employer] and was informed that Dr. Raliegh Ip could do a tier exception to bring the cost down for his Levemir. I contacted [employer] and requested they fax over the tier exception form. The pt was notified that we probably want have a response to the end of the week. The pt and the wife both verbalize understanding. They express some concerns, because the patient just started on his last insulin pen.

## 2017-06-23 NOTE — Telephone Encounter (Signed)
[name], [name], came to office to ask several questions regarding his medication adjustment. He was on Lyrica in past, tolerated well and it helped some, but did not resolve his problem with neuropathy, he was transitioned by me back to Gabapentin to avoid renal risk. Max dose 300mg  BID for 600mg , this was back in Feb 2019. I saw him recently for DM neuropathy and diabetic shoes, and he was actually reporting to take Lyrica and resumed this.   Now we contacted patient, Lucas Caldwell, and he actually states Gabapentin has improved his pain when he increased dose to twice a day now just started at this dose thinks it is better than lyrica. Agree to CONTINUE on Gabapentin 300mg  BID (no new rx sent, will send more when requested).  Removed historical med Lyrica from his list. If he had to take Lyrica, his max dose would be 50mg  TWICE daily based on renal function but he cannot take both together. He will need to decide which is preferred for him. Also we know Lyrica has higher tier cost per ins but we could consider applying for a tier exception if ultimately needed.  [name], [facility] 06/23/2017, 12:19 PM

## 2017-06-24 ENCOUNTER — Other Ambulatory Visit: Payer: Self-pay

## 2017-06-24 DIAGNOSIS — H1859 Other hereditary corneal dystrophies: Secondary | ICD-10-CM | POA: Diagnosis not present

## 2017-06-24 DIAGNOSIS — E119 Type 2 diabetes mellitus without complications: Secondary | ICD-10-CM | POA: Diagnosis not present

## 2017-06-24 DIAGNOSIS — H35372 Puckering of macula, left eye: Secondary | ICD-10-CM | POA: Diagnosis not present

## 2017-06-24 DIAGNOSIS — H26492 Other secondary cataract, left eye: Secondary | ICD-10-CM | POA: Diagnosis not present

## 2017-06-24 DIAGNOSIS — H35342 Macular cyst, hole, or pseudohole, left eye: Secondary | ICD-10-CM | POA: Diagnosis not present

## 2017-06-24 MED ORDER — METOPROLOL SUCCINATE ER 50 MG PO TB24: 50.0000 mg | ORAL_TABLET | ORAL | 3 refills | Status: DC

## 2017-06-25 ENCOUNTER — Ambulatory Visit: Payer: Medicare HMO | Admitting: Internal Medicine

## 2017-06-25 ENCOUNTER — Ambulatory Visit (INDEPENDENT_AMBULATORY_CARE_PROVIDER_SITE_OTHER): Payer: Medicare HMO | Admitting: Internal Medicine

## 2017-06-25 ENCOUNTER — Encounter: Payer: Self-pay | Admitting: Internal Medicine

## 2017-06-25 VITALS — BP 120/60 | HR 64 | Ht 64.0 in | Wt 208.5 lb

## 2017-06-25 DIAGNOSIS — I428 Other cardiomyopathies: Secondary | ICD-10-CM | POA: Diagnosis not present

## 2017-06-25 DIAGNOSIS — I5032 Chronic diastolic (congestive) heart failure: Secondary | ICD-10-CM | POA: Diagnosis not present

## 2017-06-25 DIAGNOSIS — I255 Ischemic cardiomyopathy: Secondary | ICD-10-CM

## 2017-06-25 DIAGNOSIS — I25118 Atherosclerotic heart disease of native coronary artery with other forms of angina pectoris: Secondary | ICD-10-CM

## 2017-06-25 DIAGNOSIS — N183 Chronic kidney disease, stage 3 unspecified: Secondary | ICD-10-CM

## 2017-06-25 DIAGNOSIS — E785 Hyperlipidemia, unspecified: Secondary | ICD-10-CM

## 2017-06-25 DIAGNOSIS — I10 Essential (primary) hypertension: Secondary | ICD-10-CM

## 2017-06-25 NOTE — Patient Instructions (Signed)
Medication Instructions:  Your physician recommends that you continue on your current medications as directed. Please refer to the Current Medication list given to you today.   Labwork: none  Testing/Procedures: none  Follow-Up: Your physician recommends that you schedule a follow-up appointment in: early to mid June with Dr Saunders Revel or Sharolyn Douglas.  If you need a refill on your cardiac medications before your next appointment, please call your pharmacy.

## 2017-06-25 NOTE — Progress Notes (Signed)
Follow-up Outpatient Visit Date: 06/25/2017  Primary Care Provider: [name], DO [address] [address]  Chief Complaint: Follow-up coronary artery disease and heart failure  HPI:  Lucas Caldwell is a 84 y.o. year-old male with history of coronary artery disease, mixed ischemic and nonischemic cardiomyopathy, hypertension, hyperlipidemia, type 2 diabetes mellitus, paroxysmal atrial fibrillation, chronic kidney disease stage III, CML, iron deficiency anemia, BPH, and hypothyroidism, who presents for follow-up of shortness of breath.  He was last seen by [name], NP, a month ago.  He had been on torsemide 40 mg daily with notable improvement in his breathing.  However, due to worsening renal function, this was decreased to 20 mg daily.  [name] reports that his weight has been stable and that he has had minimal leg swelling.  He was recently started on Breo by Dr. Parks Ranger and feels like this has made the most profound improvement in his breathing.  [name] denies chest pain, palpitations, lightheadedness and PND  He has stable 2-pillow orthopnea.  He has been compliant with his medications, including warfarin and ticagrelor.  He denies bleeding.  He never underwent esophageal dilation and states that he does not have any significant dysphagia as long as he takes small bites.  [name] planning to have some dental work in the near future and inquires about the need to hold warfarin and/or ticagrelor for this procedure.  --------------------------------------------------------------------------------------------------  Past Medical History:  Diagnosis Date  . Arthritis    "probably in his legs" (08/15/2016)  . Asthma   . Blind right eye   . BPH (benign prostatic hyperplasia)   . Breast asymmetry    Left breast is larger, present for several years.  [name] CAD (coronary artery disease)    a. Cath in the late 90's - reportedly ok;  b. 2014 s/p stenting x 2 @ [facility]; c  08/19/16 Cath/PCI with DES -> RCA, plan to treat LM 70% medically. Seen by surgery and felt to be too high risk for CABG.  . Chronic combined systolic (congestive) and diastolic (congestive) heart failure ([place])    a. Previously reduced EF-->50% by echo in 2012;  b. 06/2015 Echo: EF 50-55%  c. 07/2016 Echo: EF 45-50%.  . Chronic kidney disease (CKD), stage IV (severe) ([place])   . CML (chronic myelocytic leukemia) ([place])   . GERD (gastroesophageal reflux disease)   . Gout   . Hyperlipemia   . Hypertension   . Hypothyroidism   . Iron deficiency anemia   . Migraine    "in the 1960s" (08/15/2016)  . NICM (nonischemic cardiomyopathy) ([place])    a. Previously reduced EF-->50% by echo in 2012;  b. 06/2015 Echo: EF 50-55%, Gr2 DD, mild MR, mildly dil LA, Ao sclerosis, mild TR;  c. 07/2016 Echo: EF 45-50%, ? inf HK, mild MR, mildly dil LA.  [name] PAF (paroxysmal atrial fibrillation) ([facility])    a. ? Dx 2014-->s/p DCCV;  b. CHA2DS2VASc = 6-->chronic coumadin.  . Prostate cancer ([place])   . RBBB   . Type II diabetes mellitus ([place])    Past Surgical History:  Procedure Laterality Date  . CATARACT EXTRACTION W/ INTRAOCULAR LENS  IMPLANT, BILATERAL Bilateral   . CORONARY ANGIOPLASTY WITH STENT PLACEMENT  09/2012   2 stents  . CORONARY STENT INTERVENTION N/A 08/19/2016   Procedure: Coronary Stent Intervention;  Surgeon: [name], MD;  Location: [facility];  Service: Cardiovascular;  Laterality: N/A;  . ESOPHAGOGASTRODUODENOSCOPY N/A 12/20/2016  Procedure: ESOPHAGOGASTRODUODENOSCOPY (EGD);  Surgeon: Lin Landsman, MD;  Location[facility] ENDOSCOPY;  Service: Gastroenterology;  Laterality: N/A;  . HERNIA REPAIR     "navel"  . LEFT HEART CATH AND CORONARY ANGIOGRAPHY N/A 08/14/2016   Procedure: Left Heart Cath and Coronary Angiography;  Surgeon: [name], MD;  Location: [place];  Service: Cardiovascular;  Laterality: N/A;  . TOE AMPUTATION Right    "big toe"    Current Meds    Medication Sig  . acetaminophen (TYLENOL) 500 MG tablet Take 500 mg by mouth every 6 (six) hours as needed.  [name] albuterol (PROVENTIL HFA;VENTOLIN HFA) 108 (90 Base) MCG/ACT inhaler Inhale 2 puffs into the lungs every 6 (six) hours as needed for wheezing or shortness of breath.  . Alcohol Swabs 70 % PADS Use when checking blood sugar up to 3x daily  . allopurinol (ZYLOPRIM) 100 MG tablet Take 100 mg by mouth daily.  [name] atorvastatin (LIPITOR) 80 MG tablet Take 1 tablet (80 mg total) by mouth daily at 6 PM.  . benzonatate (TESSALON) 100 MG capsule Take 1 capsule (100 mg total) by mouth 2 (two) times daily as needed for cough.  . bosutinib (BOSULIF) 100 MG tablet Take 200 mg by mouth at bedtime. Take with food.   . enalapril (VASOTEC) 5 MG tablet Take 5 mg by mouth daily.  . famotidine (PEPCID) 20 MG tablet Take 20 mg by mouth at bedtime as needed for heartburn. Take 2 hours after chemo pill.  . fenofibrate 160 MG tablet Take 1 tablet (160 mg total) by mouth daily.  . ferrous sulfate 325 (65 FE) MG EC tablet Take 1 tablet (325 mg total) by mouth 3 (three) times daily with meals.  . fluticasone furoate-vilanterol (BREO ELLIPTA) 100-25 MCG/INH AEPB Inhale 1 puff into the lungs daily.  [name] gabapentin (NEURONTIN) 300 MG capsule Take 1 capsule (300 mg total) by mouth 2 (two) times daily.  . Insulin Detemir (LEVEMIR FLEXTOUCH) 100 UNIT/ML Pen Inject 50 Units into the skin daily. After breakfast  . Insulin Pen Needle 31G X 5 MM MISC 31 g by Does not apply route as directed.  [name] ipratropium (ATROVENT) 0.06 % nasal spray Place 2 sprays into both nostrils 4 (four) times daily.  . isosorbide mononitrate (IMDUR) 60 MG 24 hr tablet Take 1 tablet (60 mg total) by mouth daily.  [name] levothyroxine (SYNTHROID, LEVOTHROID) 50 MCG tablet Take 50 mcg by mouth daily before breakfast.  . loperamide (IMODIUM A-D) 2 MG tablet Take 2 mg by mouth 3 (three) times daily as needed for diarrhea or loose stools.   . magnesium oxide  (MAG-OX) 400 MG tablet Take 400 mg by mouth 2 (two) times daily.  . metoprolol succinate (TOPROL-XL) 50 MG 24 hr tablet Take 1 tablet (50 mg total) by mouth every morning.  . Multiple Vitamins-Minerals (MULTIVITAMIN ADULTS 50+ PO) Take 1 tablet by mouth every morning.  . nepafenac (NEVANAC) 0.1 % ophthalmic suspension Place 1 drop into both eyes 3 (three) times daily as needed.  . nitroGLYCERIN (NITROSTAT) 0.4 MG SL tablet Place 1 tablet (0.4 mg total) under the tongue every 5 (five) minutes x 3 doses as needed for chest pain.  . ranitidine (ZANTAC) 300 MG tablet Take 300 mg by mouth daily.  [name] Spacer/Aero-Holding Chambers (OPTICHAMBER ADVANTAGE) MISC 1 each by Other route once.  . tamsulosin (FLOMAX) 0.4 MG CAPS capsule Take 0.4 mg by mouth.  . ticagrelor (BRILINTA) 90 MG TABS tablet Take 1 tablet (90 mg  total) by mouth 2 (two) times daily.  [name] torsemide (DEMADEX) 20 MG tablet Take 20 mg by mouth daily.  [name] warfarin (COUMADIN) 2 MG tablet Take 3 mg by mouth at bedtime.     Allergies: Clopidogrel; Ciprofloxacin; and Spironolactone  Social History   Socioeconomic History  . Marital status: Married    Spouse name: Not on file  . Number of children: Not on file  . Years of education: Not on file  . Highest education level: Not on file  Occupational History  . 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  Social Needs  . Financial resource strain: Not on file  . Food insecurity:    Worry: Not on file    Inability: Not on file  . Transportation needs:    Medical: Not on file    Non-medical: Not on file  Tobacco Use  . Smoking status: Former Smoker    Packs/day: 2.00    Years: 15.00    Pack years: 30.00    Types: Cigarettes  . Smokeless tobacco: Former Systems developer  . Tobacco comment: quit 1979  Substance and Sexual Activity  . Alcohol use: No    Comment: previously drank heavily - quit 1979.  . Drug use: No  . Sexual activity: Never  Lifestyle  . Physical activity:      Days per week: Not on file    Minutes per session: Not on file  . Stress: Not on file  Relationships  . Social connections:    Talks on phone: Not on file    Gets together: Not on file    Attends religious service: Not on file    Active member of club or organization: Not on file    Attends meetings of clubs or organizations: Not on file    Relationship status: Not on file  . Intimate partner violence:    Fear of current or ex partner: Not on file    Emotionally abused: Not on file    Physically abused: Not on file    Forced sexual activity: Not on file  Other Topics Concern  . Not on file  Social History Narrative  . Not on file    Family History  Problem Relation Age of Onset  . Brain cancer Father   . Diabetes Sister   . Heart attack Sister     Review of Systems: A 12-system review of systems was performed and was negative except as noted in the HPI.  --------------------------------------------------------------------------------------------------  Physical Exam: BP 120/60 (BP Location: Left Arm, Patient Position: Sitting, Cuff Size: Large)   Pulse 64   Ht 5\' 4"  (1.626 m)   Wt 208 lb 8 oz (94.6 kg)   BMI 35.79 kg/m   General:  Obese man, seated comfortably in the exam room. HEENT: No conjunctival pallor or scleral icterus. Moist mucous membranes.  OP clear. Neck: Supple without lymphadenopathy, thyromegaly, JVD, or HJR. Lungs: Normal work of breathing. Clear to auscultation bilaterally without wheezes or crackles. Heart: Regular rate and rhythm without murmurs, rubs, or gallops. Unable to assess PMI due to body habitus. Abd: Bowel sounds present. Slightly firm but not distended.  Non-tender.  Unable to assess HSM due to body habitus. Ext: No significant Le edema bilaterally. Skin: Warm and dry without rash.  EKG:  NSR with RBBB and LAFB.  Borderline PR prolongation.  Lab Results  Component Value Date   WBC 6.9 05/21/2017   HGB 11.9 (L) 05/21/2017   HCT  36.4 (L) 05/21/2017   MCV 84.3 05/21/2017   PLT 214 05/21/2017    Lab Results  Component Value Date   NA 137 05/28/2017   K 3.8 05/28/2017   CL 103 05/28/2017   CO2 24 05/28/2017   BUN 27 (H) 05/28/2017   CREATININE 1.73 (H) 05/28/2017   GLUCOSE 219 (H) 05/28/2017   ALT 40 01/27/2017    Lab Results  Component Value Date   CHOL 99 01/08/2017   HDL 21 (L) 01/08/2017   LDLCALC 54 01/08/2017   TRIG 118 01/08/2017   CHOLHDL 4.7 01/08/2017    --------------------------------------------------------------------------------------------------  ASSESSMENT AND PLAN: Coronary artery disease with stable angina No chest pain on antianginal therapy with metoprolol succinate and isosorbide mononitrate.  Shortness of breath improved with addition of Breo inhaler.  We will continue medical therapy of significant but non-critical ostial LMCA stenosis.  We will complete 12 months of warfarin and ticagrelor (clopidogrel allergy), after which I favor switching ticagrelor to aspirin.  If his planned dental work can be done without interruption of warfarin and/or ticagrelor, I think it is fine to proceed.  If either medication needs to be held, I favor waiting until at least June.  Paroxysmal atrial fibrillation No symptoms to suggest recurrence.  Pt is in NSR today.  PR interval is borderline in the setting of RBBB and LAFB (impending trifascicular block).  If his PR interval lengthens further, he may need EP evaluation due to potential for progression to complete heart block.  Continue current doses of metoprolol as well as chronic anticoagulation with warfarin.  HFpEF with mixed ischemic and nonischemic cardiomyopathy Mr. Kijowski appears euvolemic on exam.  Weight has been stable at home but is up 2 pounds over the last month.  We will continue with torsemide 20 mg daily, as well as current doses of metoprolol succinate and enalapril.  Sodium restriction encouraged.  Chronic kidney disease stage  3 Renal function back to baseline of deesclation of torsemide to 20 mg daily.  Continue current medications and avoid nephrotoxic agents.  Hypertension BP well-controlled.  Continue current regimen.  Hyperlipidemia LDL at goal (<70) at last check in October.  Continue atorvastatin 80 mg daily.  Follow-up: Return to clinic in 2 months.  [name], MD 06/25/2017 3:28 PM

## 2017-06-26 DIAGNOSIS — I429 Cardiomyopathy, unspecified: Secondary | ICD-10-CM | POA: Insufficient documentation

## 2017-06-26 DIAGNOSIS — I255 Ischemic cardiomyopathy: Secondary | ICD-10-CM | POA: Insufficient documentation

## 2017-07-02 ENCOUNTER — Ambulatory Visit (INDEPENDENT_AMBULATORY_CARE_PROVIDER_SITE_OTHER): Payer: Medicare HMO

## 2017-07-02 DIAGNOSIS — Z7901 Long term (current) use of anticoagulants: Secondary | ICD-10-CM | POA: Diagnosis not present

## 2017-07-02 DIAGNOSIS — I480 Paroxysmal atrial fibrillation: Secondary | ICD-10-CM

## 2017-07-02 LAB — POCT INR: INR: 2.3

## 2017-07-02 NOTE — Patient Instructions (Signed)
Please continue taking 1.5 tablets daily.  Recheck INR in 6 weeks.

## 2017-07-03 NOTE — Telephone Encounter (Signed)
Called [employer]. Representative said the application needed to have patient's medicare number not [facility] Medicare number or application could not be processed.  Called patient's wife and got Medicare Number - [id number]. Placed on application and refaxed to [phone] [facility].

## 2017-07-04 NOTE — Progress Notes (Signed)
[facility]  07/07/2017     CHIEF COMPLAINT Patient presents for Retina Evaluation   HISTORY OF PRESENT ILLNESS: Lucas Caldwell is a 84 y.o. male who presents to the clinic today for:   HPI    Retina Evaluation    In left eye.  Associated Symptoms Floaters and Glare.  Context:  distance vision, mid-range vision, near vision, driving and night driving.  Treatments tried include no treatments.  I, the attending physician,  performed the HPI with the patient and updated documentation appropriately.          Comments    84 y/o male pt referred by Dr. Joneen Caraway for eval of mac hole and possible yag os.  Saw Dr. Joneen Caraway about 5 days ago.  Vision od very poor due to a surgical error during cat sx about 6 yrs ago.  Vision os blurred, and pt c/o wavy, smokey floaters, halos and glare (worse while driving) x several mos.  Denies pain, flashes.  Uses gtts for dryness prn ou, but does not recall the name.  BS yesterday was 171.  Last A1C unknown.       Last edited by Bernarda Caffey, MD on 07/07/2017 10:37 AM. (History)    Pt states he was seen by Dr. Ellin Mayhew in [place] ; Pt states he had a cataract in OD and states it was removed at [facility] and reports he was "left blind"; Pt states he was told he had a blood clot OD; Pt reports the student that performed the cataract surgery "cut the main artery" in my right eye; Pt states he felt pain and told the student to stop; Pt states he was referred to another doctor "in the same building" to see if he could take out the blood clot from OD; Pt states a doctor at [facility] performed cataract sx OS x 2-3 years ago;  Referring physician: Anell Barr, OD [place], [place] [address]  HISTORICAL INFORMATION:   Selected notes from the MEDICAL RECORD NUMBER Referred by Dr. Orson Gear for concern of mac hole and possible Yag OS;  LEE- 04.09.19 (R. [facility]ard) [BCVA OD: LP OS: 20/40-1] Ocular Hx- pseudophakia OU (compliactions  w/ cataract sx OD), MGD OU, ERM OS, MH OS, EBMD, PCO OS PMH- DM, arthritis, asthma, CAD, CHF, CKD, HTN, hyperlipidemia, migraines, hx of prostate ca, former smoker    CURRENT MEDICATIONS: Current Outpatient Medications (Ophthalmic Drugs)  Medication Sig  . nepafenac (NEVANAC) 0.1 % ophthalmic suspension Place 1 drop into both eyes 3 (three) times daily as needed.  . prednisoLONE acetate (PRED FORTE) 1 % ophthalmic suspension Place 1 drop into the left eye 4 (four) times daily for 7 days.   No current facility-administered medications for this visit.  (Ophthalmic Drugs)   Current Outpatient Medications (Other)  Medication Sig  . acetaminophen (TYLENOL) 500 MG tablet Take 500 mg by mouth every 6 (six) hours as needed.  [name] albuterol (PROVENTIL HFA;VENTOLIN HFA) 108 (90 Base) MCG/ACT inhaler Inhale 2 puffs into the lungs every 6 (six) hours as needed for wheezing or shortness of breath.  . Alcohol Swabs 70 % PADS Use when checking blood sugar up to 3x daily  . allopurinol (ZYLOPRIM) 100 MG tablet Take 100 mg by mouth daily.  [name] atorvastatin (LIPITOR) 80 MG tablet Take 1 tablet (80 mg total) by mouth daily at 6 PM.  . benzonatate (TESSALON) 100 MG capsule Take 1 capsule (100 mg total) by  mouth 2 (two) times daily as needed for cough.  . bosutinib (BOSULIF) 100 MG tablet Take 200 mg by mouth at bedtime. Take with food.   . enalapril (VASOTEC) 5 MG tablet Take 5 mg by mouth daily.  . famotidine (PEPCID) 20 MG tablet Take 20 mg by mouth at bedtime as needed for heartburn. Take 2 hours after chemo pill.  . fenofibrate 160 MG tablet Take 1 tablet (160 mg total) by mouth daily.  . ferrous sulfate 325 (65 FE) MG EC tablet Take 1 tablet (325 mg total) by mouth 3 (three) times daily with meals.  . fluticasone furoate-vilanterol (BREO ELLIPTA) 100-25 MCG/INH AEPB Inhale 1 puff into the lungs daily.  [name] gabapentin (NEURONTIN) 300 MG capsule Take 1 capsule (300 mg total) by mouth 2 (two) times daily.  .  Insulin Detemir (LEVEMIR FLEXTOUCH) 100 UNIT/ML Pen Inject 50 Units into the skin daily. After breakfast  . Insulin Pen Needle 31G X 5 MM MISC 31 g by Does not apply route as directed.  [name] ipratropium (ATROVENT) 0.06 % nasal spray Place 2 sprays into both nostrils 4 (four) times daily.  . isosorbide mononitrate (IMDUR) 60 MG 24 hr tablet Take 1 tablet (60 mg total) by mouth daily.  [name] levothyroxine (SYNTHROID, LEVOTHROID) 50 MCG tablet Take 50 mcg by mouth daily before breakfast.  . loperamide (IMODIUM A-D) 2 MG tablet Take 2 mg by mouth 3 (three) times daily as needed for diarrhea or loose stools.   . magnesium oxide (MAG-OX) 400 MG tablet Take 400 mg by mouth 2 (two) times daily.  . metoprolol succinate (TOPROL-XL) 50 MG 24 hr tablet Take 1 tablet (50 mg total) by mouth every morning.  . Multiple Vitamins-Minerals (MULTIVITAMIN ADULTS 50+ PO) Take 1 tablet by mouth every morning.  . nitroGLYCERIN (NITROSTAT) 0.4 MG SL tablet Place 1 tablet (0.4 mg total) under the tongue every 5 (five) minutes x 3 doses as needed for chest pain.  . ranitidine (ZANTAC) 300 MG tablet Take 300 mg by mouth daily.  [name] Spacer/Aero-Holding Chambers (OPTICHAMBER ADVANTAGE) MISC 1 each by Other route once.  . tamsulosin (FLOMAX) 0.4 MG CAPS capsule Take 0.4 mg by mouth.  . ticagrelor (BRILINTA) 90 MG TABS tablet Take 1 tablet (90 mg total) by mouth 2 (two) times daily.  [name] torsemide (DEMADEX) 20 MG tablet Take 20 mg by mouth daily.  [name] warfarin (COUMADIN) 2 MG tablet Take 3 mg by mouth at bedtime.    No current facility-administered medications for this visit.  (Other)      REVIEW OF SYSTEMS: ROS    Positive for: Endocrine, Eyes   Negative for: Constitutional, Gastrointestinal, Neurological, Skin, Genitourinary, Musculoskeletal, HENT, Cardiovascular, Respiratory, Psychiatric, Allergic/Imm, Heme/Lymph   Last edited by Matthew Folks, COA on 07/07/2017  9:20 AM. (History)       ALLERGIES Allergies  Allergen  Reactions  . Clopidogrel Anaphylaxis    Other reaction(s): ANAPHYLAXIS;altered mental status;  (electrolytes "out of wack" and confusion per family; THEY DO NOT REMEMBER ANY OTHER REACTION)- MSB 10/10/15  . Ciprofloxacin Itching  . Spironolactone     Other reaction(s): Other (See Comments) gynecomastia    PAST MEDICAL HISTORY Past Medical History:  Diagnosis Date  . Arthritis    "probably in his legs" (08/15/2016)  . Asthma   . Blind right eye   . BPH (benign prostatic hyperplasia)   . Breast asymmetry    Left breast is larger, present for several years.  [name] CAD (coronary artery disease)  a. Cath in the late 90's - reportedly ok;  b. 2014 s/p stenting x 2 @ [facility]; c 08/19/16 Cath/PCI with DES -> RCA, plan to treat LM 70% medically. Seen by surgery and felt to be too high risk for CABG.  . Chronic combined systolic (congestive) and diastolic (congestive) heart failure ([place])    a. Previously reduced EF-->50% by echo in 2012;  b. 06/2015 Echo: EF 50-55%  c. 07/2016 Echo: EF 45-50%.  . Chronic kidney disease (CKD), stage IV (severe) ([place])   . CML (chronic myelocytic leukemia) ([place])   . GERD (gastroesophageal reflux disease)   . Gout   . Hyperlipemia   . Hypertension   . Hypothyroidism   . Iron deficiency anemia   . Migraine    "in the 1960s" (08/15/2016)  . NICM (nonischemic cardiomyopathy) ([place])    a. Previously reduced EF-->50% by echo in 2012;  b. 06/2015 Echo: EF 50-55%, Gr2 DD, mild MR, mildly dil LA, Ao sclerosis, mild TR;  c. 07/2016 Echo: EF 45-50%, ? inf HK, mild MR, mildly dil LA.  [name] PAF (paroxysmal atrial fibrillation) ([facility])    a. ? Dx 2014-->s/p DCCV;  b. CHA2DS2VASc = 6-->chronic coumadin.  . Prostate cancer ([place])   . RBBB   . Type II diabetes mellitus ([place])    Past Surgical History:  Procedure Laterality Date  . CATARACT EXTRACTION W/ INTRAOCULAR LENS  IMPLANT, BILATERAL Bilateral   . CORONARY ANGIOPLASTY WITH STENT PLACEMENT  09/2012   2 stents  . CORONARY STENT  INTERVENTION N/A 08/19/2016   Procedure: Coronary Stent Intervention;  Surgeon: [name], MD;  Location: [facility];  Service: Cardiovascular;  Laterality: N/A;  . ESOPHAGOGASTRODUODENOSCOPY N/A 12/20/2016   Procedure: ESOPHAGOGASTRODUODENOSCOPY (EGD);  Surgeon: Lin Landsman, MD;  Location: [facility];  Service: Gastroenterology;  Laterality: N/A;  . HERNIA REPAIR     "navel"  . LEFT HEART CATH AND CORONARY ANGIOGRAPHY N/A 08/14/2016   Procedure: Left Heart Cath and Coronary Angiography;  Surgeon: [name], MD;  Location: [facility];  Service: Cardiovascular;  Laterality: N/A;  . TOE AMPUTATION Right    "big toe"    FAMILY HISTORY Family History  Problem Relation Age of Onset  . Brain cancer Father   . Diabetes Sister   . Heart attack Sister     SOCIAL HISTORY Social History   Tobacco Use  . Smoking status: Former Smoker    Packs/day: 2.00    Years: 15.00    Pack years: 30.00    Types: Cigarettes  . Smokeless tobacco: Former Systems developer  . Tobacco comment: quit 1979  Substance Use Topics  . Alcohol use: No    Comment: previously drank heavily - quit 1979.  . Drug use: No         OPHTHALMIC EXAM:  Base Eye Exam    Visual Acuity (Snellen - Linear)      Right Left   Dist cc HM 20/30   Dist ph cc HM 20/30   Correction:  Glasses       Tonometry (Tonopen, 9:18 AM)      Right Left   Pressure 10 8       Pupils      Dark Light Shape React APD   Right 4.5 4.5 Irregular No None   Left 1 1 Round No None       Visual Fields (Counting fingers)      Left Right    Full    Restrictions  Total superior temporal, inferior temporal, superior nasal, inferior nasal deficiencies  Only sees HM OD.  Cannot count fingers, even at face,       Extraocular Movement      Right Left    Full, Ortho Full, Ortho       Neuro/Psych    Oriented x3:  Yes   Mood/Affect:  Normal       Dilation    Both eyes:  1.0% Mydriacyl, 2.5% Phenylephrine  @ 9:18 AM        Slit Lamp and Fundus Exam    Slit Lamp Exam      Right Left   Lids/Lashes Dermatochalasis - upper lid Dermatochalasis - upper lid, pigmented papillomas UL   Conjunctiva/Sclera Nasal pterygium  Pinguecula   Cornea K haze and scarring temporally Arcus, trace Punctate epithelial erosions, Well healed cataract wounds   Anterior Chamber deep nasally, IK touch temporally Deep and quiet   Iris Irregular pupil, TID at 0430, scattered iris atrophy Round and moderately dilated to 5 mm   Lens Posterior chamber intraocular lens displaced nasally, open PC PC IOL in good position, 1+ Posterior capsular opacification   Vitreous Vitreous syneresis Vitreous syneresis, Posterior vitreous detachment       Fundus Exam      Right Left   Disc 3+ Pallor Pink and Sharp, mild Temporal Peripapillary atrophy   C/D Ratio 0.3 0.3   Macula Flat, No heme or edema, mild Retinal pigment epithelial mottling Epiretinal membrane, lamellar hole, no heme   Vessels Mild Vascular attenuation Mild Vascular attenuation   Periphery Attached, few scattered IRH Attached        Refraction    Wearing Rx      Sphere Cylinder   Right [place] Sphere   Left +0.25 Sphere   Age:  6 yrs       Manifest Refraction      Sphere Cylinder Axis Dist VA   Right [place] Sphere  HM   Left +0.25 +0.25 180 20/30+          IMAGING AND PROCEDURES  Imaging and Procedures for 07/08/17  OCT, Retina - OU - Both Eyes       Right Eye Quality was good. Central Foveal Thickness: 291. Progression has no prior data. Findings include normal foveal contour, no IRF, no SRF, epiretinal membrane (Diffuse atrophy and loss of lamination).   Left Eye Quality was good. Central Foveal Thickness: 331. Progression has no prior data. Findings include abnormal foveal contour, intraretinal fluid, no SRF, epiretinal membrane, pigment epithelial detachment, lamellar hole.   Notes *Images captured and stored on drive  Diagnosis /  Impression:  OD: NFP, No IRF/SRF; diffuse atrophy and loss of lamination OS: ERM with lamellar macular hole and nasal cystic changes  Clinical management:  See below  Abbreviations: NFP - Normal foveal profile. CME - cystoid macular edema. PED - pigment epithelial detachment. IRF - intraretinal fluid. SRF - subretinal fluid. EZ - ellipsoid zone. ERM - epiretinal membrane. ORA - outer retinal atrophy. ORT - outer retinal tubulation. SRHM - subretinal hyper-reflective material         Yag Capsulotomy - OS - Left Eye       Procedure note: YAG Capsulotomy, LEFT Eye  Informed consent obtained. Pre-op dilating drops (1% Topicamide and 2.5% Phenylephrine), and topical anesthesia given. Power: 4.2 mJ Shots: 30 Posterior capsulotomy in can opener formation performed without difficulty. Patient tolerated procedure well. No complications. Rx pred forte 4 times a day for 7  days, then stop. Pt received written and verbal post laser education. Recheck in 1-2 weeks w/ dilated exam.                 ASSESSMENT/PLAN:    ICD-10-CM   1. Epiretinal membrane (ERM) of left eye H35.372   2. Lamellar macular hole of left eye H35.342   3. Retinal edema H35.81 OCT, Retina - OU - Both Eyes  4. Mild nonproliferative diabetic retinopathy of both eyes without macular edema associated with type 2 diabetes mellitus ([place]) C62.3762   5. 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 Pseudophakia of both eyes Z96.1   7. Dislocation of intraocular lens, sequela T85.22XS   8. Left posterior capsular opacification H26.492 Yag Capsulotomy - OS - Left Eye    1,2. Epiretinal membrane w/ lamellar macular hole, OS - The natural history, anatomy, potential for loss of vision, and treatment options including vitrectomy techniques and the complications of endophthalmitis, retinal detachment, vitreous hemorrhage, cataract progression and permanent vision loss discussed with the patient. - mild ERM w/ lamellar macular hole OS -  no significant change in OCT from 2017 visit with Dr. Zigmund Daniel - BCVA 20/30 OS - denies metamorphopsia; main complaint is glare - suspect majority of visual complaints associated with PCO - no indication for ERM surgery at this time - monitor  3. Mild cystic changes on OCT OS - monitor  4. Mild nonproliferative diabetic retinopathy w/o DME, both eyes - The incidence, risk factors for progression, natural history and treatment options for diabetic retinopathy were discussed with patient.   - The need for close monitoring of blood glucose, blood pressure, and serum lipids, avoiding cigarette or any type of tobacco, and the need for long term follow up was also discussed with patient. - exam with minimal IRH OU  - OCT without diabetic macular edema, both eyes  - f/u in 2-3 mos  5. Optic atrophy OD - pt reports vision loss coinciding with cataract surgery OD - BCVA HM OD - stable - monitor  6,7. Pseudophakia OU - s/p CE/IOL OU at [facility] with nasally displaced IOL - monitor  8. PCO OS  - pt reports significant glare symptoms OS - discussed exam findings and treatment options - recommend Yag Cap OS - pt wishes to proceed - RBA of procedure discussed, questions answered - informed consent obtained and signed - see procedure note - start PF QID OS x7 days - F/U 1-2 weeks   Ophthalmic Meds Ordered this visit:  Meds ordered this encounter  Medications  . prednisoLONE acetate (PRED FORTE) 1 % ophthalmic suspension    Sig: Place 1 drop into the left eye 4 (four) times daily for 7 days.    Dispense:  10 mL    Refill:  0       Return in about 2 weeks (around 07/21/2017) for POV.  There are no Patient Instructions on file for this visit.   Explained the diagnoses, plan, and follow up with the patient and they expressed understanding.  Patient expressed understanding of the importance of proper follow up care.   This document serves as a record of services  personally performed by Gardiner Sleeper, MD, PhD. It was created on their behalf by Catha Brow, [place], a certified ophthalmic assistant. The creation of this record is the provider's dictation and/or activities during the visit.  Electronically signed by: Catha Brow, COA  07/08/17 1:42 PM  Gardiner Sleeper, M.D., Ph.D. Diseases & Surgery  of the Retina and [place] 07/08/17   I have reviewed the above documentation for accuracy and completeness, and I agree with the above. Gardiner Sleeper, M.D., Ph.D. 07/08/17 1:42 PM     Abbreviations: M myopia (nearsighted); A astigmatism; H hyperopia (farsighted); P presbyopia; Mrx spectacle prescription;  CTL contact lenses; OD right eye; OS left eye; OU both eyes  XT exotropia; ET esotropia; PEK punctate epithelial keratitis; PEE punctate epithelial erosions; DES dry eye syndrome; MGD meibomian gland dysfunction; ATs artificial tears; PFAT's preservative free artificial tears; [place] nuclear sclerotic cataract; PSC posterior subcapsular cataract; ERM epi-retinal membrane; PVD posterior vitreous detachment; RD retinal detachment; DM diabetes mellitus; DR diabetic retinopathy; NPDR non-proliferative diabetic retinopathy; PDR proliferative diabetic retinopathy; CSME clinically significant macular edema; DME diabetic macular edema; dbh dot blot hemorrhages; CWS cotton wool spot; POAG primary open angle glaucoma; C/D cup-to-disc ratio; HVF humphrey visual field; GVF goldmann visual field; OCT optical coherence tomography; IOP intraocular pressure; BRVO Branch retinal vein occlusion; CRVO central retinal vein occlusion; CRAO central retinal artery occlusion; BRAO branch retinal artery occlusion; RT retinal tear; SB scleral buckle; PPV pars plana vitrectomy; VH Vitreous hemorrhage; PRP panretinal laser photocoagulation; IVK intravitreal kenalog; VMT vitreomacular traction; MH Macular hole;  NVD neovascularization of the disc; NVE  neovascularization elsewhere; AREDS age related eye disease study; ARMD age related macular degeneration; POAG primary open angle glaucoma; EBMD epithelial/anterior basement membrane dystrophy; ACIOL anterior chamber intraocular lens; IOL intraocular lens; PCIOL posterior chamber intraocular lens; Phaco/IOL phacoemulsification with intraocular lens placement; [place] photorefractive keratectomy; LASIK laser assisted in situ keratomileusis; HTN hypertension; DM diabetes mellitus; COPD chronic obstructive pulmonary disease

## 2017-07-07 ENCOUNTER — Ambulatory Visit (INDEPENDENT_AMBULATORY_CARE_PROVIDER_SITE_OTHER): Payer: Medicare HMO | Admitting: Ophthalmology

## 2017-07-07 DIAGNOSIS — E113293 Type 2 diabetes mellitus with mild nonproliferative diabetic retinopathy without macular edema, bilateral: Secondary | ICD-10-CM | POA: Diagnosis not present

## 2017-07-07 DIAGNOSIS — H26492 Other secondary cataract, left eye: Secondary | ICD-10-CM

## 2017-07-07 DIAGNOSIS — H3581 Retinal edema: Secondary | ICD-10-CM

## 2017-07-07 DIAGNOSIS — Z961 Presence of intraocular lens: Secondary | ICD-10-CM | POA: Diagnosis not present

## 2017-07-07 DIAGNOSIS — H35372 Puckering of macula, left eye: Secondary | ICD-10-CM | POA: Diagnosis not present

## 2017-07-07 DIAGNOSIS — T8522XS Displacement of intraocular lens, sequela: Secondary | ICD-10-CM | POA: Diagnosis not present

## 2017-07-07 DIAGNOSIS — H35342 Macular cyst, hole, or pseudohole, left eye: Secondary | ICD-10-CM | POA: Diagnosis not present

## 2017-07-07 DIAGNOSIS — H4720 Unspecified optic atrophy: Secondary | ICD-10-CM | POA: Diagnosis not present

## 2017-07-07 LAB — HM DIABETES EYE EXAM

## 2017-07-07 MED ORDER — PREDNISOLONE ACETATE 1 % OP SUSP: 1.0000 [drp] | Freq: Four times a day (QID) | OPHTHALMIC | 0 refills | Status: AC

## 2017-07-08 ENCOUNTER — Encounter: Payer: Self-pay | Admitting: Family Medicine

## 2017-07-08 ENCOUNTER — Encounter (INDEPENDENT_AMBULATORY_CARE_PROVIDER_SITE_OTHER): Payer: Self-pay | Admitting: Ophthalmology

## 2017-07-08 DIAGNOSIS — E11319 Type 2 diabetes mellitus with unspecified diabetic retinopathy without macular edema: Secondary | ICD-10-CM | POA: Insufficient documentation

## 2017-07-10 ENCOUNTER — Ambulatory Visit
Admit: 2017-07-10 | Discharge: 2017-07-10 | Payer: MEDICARE | Attending: Hematology & Oncology | Primary: Hematology & Oncology

## 2017-07-10 ENCOUNTER — Ambulatory Visit: Admit: 2017-07-10 | Discharge: 2017-07-10 | Payer: MEDICARE

## 2017-07-10 DIAGNOSIS — C9210 Chronic myeloid leukemia, BCR/ABL-positive, not having achieved remission: Secondary | ICD-10-CM

## 2017-07-10 DIAGNOSIS — E785 Hyperlipidemia, unspecified: Principal | ICD-10-CM

## 2017-07-10 DIAGNOSIS — I110 Hypertensive heart disease with heart failure: Secondary | ICD-10-CM | POA: Diagnosis not present

## 2017-07-10 DIAGNOSIS — D509 Iron deficiency anemia, unspecified: Secondary | ICD-10-CM | POA: Diagnosis not present

## 2017-07-10 DIAGNOSIS — I509 Heart failure, unspecified: Secondary | ICD-10-CM | POA: Diagnosis not present

## 2017-07-10 DIAGNOSIS — I480 Paroxysmal atrial fibrillation: Secondary | ICD-10-CM | POA: Diagnosis not present

## 2017-07-10 DIAGNOSIS — M109 Gout, unspecified: Secondary | ICD-10-CM | POA: Diagnosis not present

## 2017-07-10 DIAGNOSIS — E7800 Pure hypercholesterolemia, unspecified: Secondary | ICD-10-CM | POA: Diagnosis not present

## 2017-07-10 DIAGNOSIS — E119 Type 2 diabetes mellitus without complications: Secondary | ICD-10-CM | POA: Diagnosis not present

## 2017-07-10 DIAGNOSIS — I2510 Atherosclerotic heart disease of native coronary artery without angina pectoris: Secondary | ICD-10-CM | POA: Diagnosis not present

## 2017-07-14 ENCOUNTER — Telehealth: Payer: Self-pay | Admitting: Family Medicine

## 2017-07-14 NOTE — Telephone Encounter (Signed)
Pt's wife has a question about increasing his insulin [phone]

## 2017-07-14 NOTE — Telephone Encounter (Signed)
Spoke to wife advise her about 24 hour return policy phone call and also advised that he has appointment coming up sooner in may and keep record of blood sugar and if provider decide to change dosage will call the patient. His blood sugar stays on 200-230.

## 2017-07-14 NOTE — Telephone Encounter (Signed)
Last time we discussed his Diabetes management was in 04/2017, he had improved A1c. He was advised to continue current regimen with insulin and consider adding new medicine. I gave him information on the new injectable meds that I recommended  He was asked to call insurance and find cost/coverage and prices of the following: - Ozempic (Semaglutide injection) - Bydureon BCise (Exenatide ER) - Trulicity (Dulaglutide) - Victoza (Liraglutide)  I have not heard back from them regarding this request.  I would not plan on changing his current insulin dosing yet until I review the CBG log that they have, he is scheduled for office visit on 07/22/17 and we will review med changes at that time based on A1c.  If you can call her back, to review the above recommendations - let her know to find out the cost/coverage of those meds above, and lastly no change until next week when we check A1c and review sugar log  [name], DO [facility] 07/14/2017, 5:03 PM

## 2017-07-15 ENCOUNTER — Other Ambulatory Visit: Admit: 2017-07-15 | Discharge: 2017-07-16 | Payer: MEDICARE

## 2017-07-15 DIAGNOSIS — C9210 Chronic myeloid leukemia, BCR/ABL-positive, not having achieved remission: Principal | ICD-10-CM

## 2017-07-15 NOTE — Telephone Encounter (Signed)
Patient advised.

## 2017-07-16 ENCOUNTER — Other Ambulatory Visit: Payer: Self-pay | Admitting: Family Medicine

## 2017-07-16 ENCOUNTER — Telehealth: Payer: Self-pay | Admitting: Internal Medicine

## 2017-07-16 ENCOUNTER — Other Ambulatory Visit: Payer: Self-pay

## 2017-07-16 MED ORDER — MAGNESIUM OXIDE 400 MG PO TABS: 400.0000 mg | ORAL_TABLET | Freq: Two times a day (BID) | ORAL | 3 refills | Status: DC

## 2017-07-16 NOTE — Telephone Encounter (Signed)
Patient calling the office for samples of medication:   1.  What medication and dosage are you requesting samples for? Brilinta  2.  Are you currently out of this medication?  Yes    Pt wife came into office States she can't afford to pay for this medication and would like advise on this Please call back

## 2017-07-16 NOTE — Telephone Encounter (Signed)
S/w [employer] representative. He stated this appeal was denied on 05/24/17 for tier exception. I can call the [place] for Medicare to ask for tier exception at [phone].  Called and left message with them. Recording said that they will consider the appeal and call back if appropriate.

## 2017-07-16 NOTE — Telephone Encounter (Signed)
Spoke with Mrs. Rodino. She states they are almost to the point where they can receive assistance but she says that she was told he may come off it in June. It is still costly for them. I have filed appeal for tier exception, but this was denied. Will leave samples at front desk for her to pick up. Will confirm with Dr End that patient may stop Brilinta in June and hopefully we can provide an adequate supply of samples to get patient to that point. Patient's next appointment is 09/02/17 with [name], NP.

## 2017-07-16 NOTE — Telephone Encounter (Signed)
I would like to continue ticagrelor 1 more month, at which time we can stop ticagrelor and continue with warfarin and aspirin 81 mg daily.  If the medication is unaffordable and samples are not available, ticagrelor could be stopped when Mr. Giovanetti runs out of his current supply.  [name], MD [facility] Pager: [phone]

## 2017-07-16 NOTE — Telephone Encounter (Signed)
Medication Samples have been provided to the patient.  Drug name: Brilinta       Strength: 90 mg        Qty: 2 bottles  LOT: [id number]  Exp.Date: 07/2019

## 2017-07-18 NOTE — Progress Notes (Signed)
[facility] Note  07/21/2017     CHIEF COMPLAINT Patient presents for Post-op Follow-up   HISTORY OF PRESENT ILLNESS: Lucas Caldwell is a 84 y.o. male who presents to the clinic today for:   HPI    Post-op Follow-up    In left eye.  Discomfort includes floaters.  Negative for pain, itching, foreign body sensation, tearing and discharge.  Vision is improved.  I, the attending physician,  performed the HPI with the patient and updated documentation appropriately.          Comments    POV S/P yag  OS (07/07/17) /ERM OS. Patient states his vision was a little better until yesterday, while driving yesterday his vision became blurry, "like a dark cloud" , vision is better this am.Pt continues to have occasional  floaters OS.  Pt Bs 192 this am, pt reports he had a large meal that included rice and gravy yesterday. Pt completed Pred [place] eye gtt's as instructed,pt  is taking vit's qd.       Last edited by Bernarda Caffey, MD on 07/21/2017 11:16 AM. (History)    Pt states   Referring physician: [name], DO [place], [place] [address]  HISTORICAL INFORMATION:   Selected notes from the MEDICAL RECORD NUMBER Referred by Dr. Orson Gear for concern of mac hole and possible Yag OS;  LEE- 04.09.19 ([facility]. [facility]) [BCVA OD: LP OS: 20/40-1] Ocular Hx- pseudophakia OU (compliactions w/ cataract sx OD), MGD OU, ERM OS, MH OS, EBMD, PCO OS PMH- DM, arthritis, asthma, CAD, CHF, CKD, HTN, hyperlipidemia, migraines, hx of prostate ca, former smoker    CURRENT MEDICATIONS: Current Outpatient Medications (Ophthalmic Drugs)  Medication Sig  . nepafenac (NEVANAC) 0.1 % ophthalmic suspension Place 1 drop into both eyes 3 (three) times daily as needed.   No current facility-administered medications for this visit.  (Ophthalmic Drugs)   Current Outpatient Medications (Other)  Medication Sig  . acetaminophen (TYLENOL) 500 MG tablet Take 500 mg by mouth  every 6 (six) hours as needed.  [name] albuterol (PROVENTIL HFA;VENTOLIN HFA) 108 (90 Base) MCG/ACT inhaler Inhale 2 puffs into the lungs every 6 (six) hours as needed for wheezing or shortness of breath.  . Alcohol Swabs 70 % PADS Use when checking blood sugar up to 3x daily  . allopurinol (ZYLOPRIM) 100 MG tablet Take 100 mg by mouth daily.  [name] atorvastatin (LIPITOR) 80 MG tablet Take 1 tablet (80 mg total) by mouth daily at 6 PM.  . bosutinib (BOSULIF) 100 MG tablet Take 200 mg by mouth at bedtime. Take with food.   . enalapril (VASOTEC) 5 MG tablet Take 5 mg by mouth daily.  . famotidine (PEPCID) 20 MG tablet Take 20 mg by mouth at bedtime as needed for heartburn. Take 2 hours after chemo pill.  . fenofibrate 160 MG tablet Take 1 tablet (160 mg total) by mouth daily.  . ferrous sulfate 325 (65 FE) MG EC tablet Take 1 tablet (325 mg total) by mouth 3 (three) times daily with meals.  . fluticasone furoate-vilanterol (BREO ELLIPTA) 100-25 MCG/INH AEPB Inhale 1 puff into the lungs daily.  [name] gabapentin (NEURONTIN) 300 MG capsule Take 1 capsule (300 mg total) by mouth 2 (two) times daily.  . Insulin Detemir (LEVEMIR FLEXTOUCH) 100 UNIT/ML Pen Inject 50 Units into the skin daily. After breakfast  . Insulin Pen Needle 31G X 5 MM MISC 31 g by Does not apply route  as directed.  [name] ipratropium (ATROVENT) 0.06 % nasal spray Place 2 sprays into both nostrils 4 (four) times daily.  . isosorbide mononitrate (IMDUR) 60 MG 24 hr tablet Take 1 tablet (60 mg total) by mouth daily.  [name] levothyroxine (SYNTHROID, LEVOTHROID) 50 MCG tablet Take 50 mcg by mouth daily before breakfast.  . loperamide (IMODIUM A-D) 2 MG tablet Take 2 mg by mouth 3 (three) times daily as needed for diarrhea or loose stools.   . magnesium oxide (MAG-OX) 400 MG tablet Take 1 tablet (400 mg total) by mouth 2 (two) times daily.  . metoprolol succinate (TOPROL-XL) 50 MG 24 hr tablet Take 1 tablet (50 mg total) by mouth every morning.  . Multiple  Vitamins-Minerals (MULTIVITAMIN ADULTS 50+ PO) Take 1 tablet by mouth every morning.  . nitroGLYCERIN (NITROSTAT) 0.4 MG SL tablet Place 1 tablet (0.4 mg total) under the tongue every 5 (five) minutes x 3 doses as needed for chest pain.  . ranitidine (ZANTAC) 300 MG tablet Take 300 mg by mouth daily.  [name] Spacer/Aero-Holding Chambers (OPTICHAMBER ADVANTAGE) MISC 1 each by Other route once.  . tamsulosin (FLOMAX) 0.4 MG CAPS capsule Take 0.4 mg by mouth.  . ticagrelor (BRILINTA) 90 MG TABS tablet Take 1 tablet (90 mg total) by mouth 2 (two) times daily.  [name] torsemide (DEMADEX) 20 MG tablet Take 20 mg by mouth daily.  [name] warfarin (COUMADIN) 2 MG tablet Take 3 mg by mouth at bedtime.   . benzonatate (TESSALON) 100 MG capsule Take 1 capsule (100 mg total) by mouth 2 (two) times daily as needed for cough. (Patient not taking: Reported on 07/21/2017)   No current facility-administered medications for this visit.  (Other)      REVIEW OF SYSTEMS: ROS    Positive for: Constitutional, Musculoskeletal, Endocrine, Cardiovascular, Eyes, Heme/Lymph   Negative for: Gastrointestinal, Neurological, Skin, Genitourinary, HENT, Respiratory, Psychiatric, Allergic/Imm   Last edited by Zenovia Jordan, LPN on 03/23/1094 04:54 AM. (History)       ALLERGIES Allergies  Allergen Reactions  . Clopidogrel Anaphylaxis    Other reaction(s): ANAPHYLAXIS;altered mental status;  (electrolytes "out of wack" and confusion per family; THEY DO NOT REMEMBER ANY OTHER REACTION)- MSB 10/10/15  . Ciprofloxacin Itching  . Spironolactone     Other reaction(s): Other (See Comments) gynecomastia    PAST MEDICAL HISTORY Past Medical History:  Diagnosis Date  . Arthritis    "probably in his legs" (08/15/2016)  . Asthma   . Blind right eye   . BPH (benign prostatic hyperplasia)   . Breast asymmetry    Left breast is larger, present for several years.  [name] CAD (coronary artery disease)    a. Cath in the late 90's - reportedly ok;   b. 2014 s/p stenting x 2 @ [facility]; c 08/19/16 Cath/PCI with DES -> RCA, plan to treat LM 70% medically. Seen by surgery and felt to be too high risk for CABG.  . Chronic combined systolic (congestive) and diastolic (congestive) heart failure ([place])    a. Previously reduced EF-->50% by echo in 2012;  b. 06/2015 Echo: EF 50-55%  c. 07/2016 Echo: EF 45-50%.  . Chronic kidney disease (CKD), stage IV (severe) ([place])   . CML (chronic myelocytic leukemia) ([place])   . GERD (gastroesophageal reflux disease)   . Gout   . Hyperlipemia   . Hypertension   . Hypothyroidism   . Iron deficiency anemia   . Migraine    "in the 1960s" (08/15/2016)  . NICM (nonischemic  cardiomyopathy) ([place])    a. Previously reduced EF-->50% by echo in 2012;  b. 06/2015 Echo: EF 50-55%, Gr2 DD, mild MR, mildly dil LA, Ao sclerosis, mild TR;  c. 07/2016 Echo: EF 45-50%, ? inf HK, mild MR, mildly dil LA.  [name] PAF (paroxysmal atrial fibrillation) ([facility])    a. ? Dx 2014-->s/p DCCV;  b. CHA2DS2VASc = 6-->chronic coumadin.  . Prostate cancer ([place])   . RBBB   . Type II diabetes mellitus ([place])    Past Surgical History:  Procedure Laterality Date  . CATARACT EXTRACTION W/ INTRAOCULAR LENS  IMPLANT, BILATERAL Bilateral   . CORONARY ANGIOPLASTY WITH STENT PLACEMENT  09/2012   2 stents  . CORONARY STENT INTERVENTION N/A 08/19/2016   Procedure: Coronary Stent Intervention;  Surgeon: [name], MD;  Location: [facility];  Service: Cardiovascular;  Laterality: N/A;  . ESOPHAGOGASTRODUODENOSCOPY N/A 12/20/2016   Procedure: ESOPHAGOGASTRODUODENOSCOPY (EGD);  Surgeon: Lin Landsman, MD;  Location: [facility];  Service: Gastroenterology;  Laterality: N/A;  . HERNIA REPAIR     "navel"  . LEFT HEART CATH AND CORONARY ANGIOGRAPHY N/A 08/14/2016   Procedure: Left Heart Cath and Coronary Angiography;  Surgeon: [name], MD;  Location: [facility];  Service: Cardiovascular;  Laterality: N/A;  . TOE AMPUTATION Right     "big toe"    FAMILY HISTORY Family History  Problem Relation Age of Onset  . Brain cancer Father   . Diabetes Sister   . Heart attack Sister     SOCIAL HISTORY Social History   Tobacco Use  . Smoking status: Former Smoker    Packs/day: 2.00    Years: 15.00    Pack years: 30.00    Types: Cigarettes  . Smokeless tobacco: Former Systems developer  . Tobacco comment: quit 1979  Substance Use Topics  . Alcohol use: No    Comment: previously drank heavily - quit 1979.  . Drug use: No         OPHTHALMIC EXAM:  Base Eye Exam    Visual Acuity (Snellen - Linear)      Right Left   Dist cc HM 20/30 -   Dist ph cc HM 20/30 -   Correction:  Glasses       Tonometry (Tonopen, 10:12 AM)      Right Left   Pressure 8 10       Pupils      Dark Light Shape React APD   Right 4 4 Irregular No None   Left 1 1 Round No None       Visual Fields (Counting fingers)      Left Right    Full    Restrictions  Total superior temporal, inferior temporal, superior nasal, inferior nasal deficiencies       Extraocular Movement      Right Left    Full, Ortho Full, Ortho       Neuro/Psych    Oriented x3:  Yes   Mood/Affect:  Normal       Dilation    Both eyes:  1.0% Mydriacyl, 2.5% Phenylephrine @ 10:12 AM        Slit Lamp and Fundus Exam    Slit Lamp Exam      Right Left   Lids/Lashes Dermatochalasis - upper lid Dermatochalasis - upper lid, pigmented papillomas UL   Conjunctiva/Sclera Nasal pterygium  Pinguecula   Cornea K haze and scarring temporally Arcus, trace Punctate epithelial erosions, Well healed cataract wounds   Anterior  Chamber deep nasally, IK touch temporally Deep and quiet   Iris Irregular pupil, TID at 0430, scattered iris atrophy Round and moderately dilated to 5 mm   Lens Posterior chamber intraocular lens displaced nasally, open PC PC IOL in good position, open PC   Vitreous Vitreous syneresis Vitreous syneresis, Posterior vitreous detachment, vitreous debris  settling inferiorly        Fundus Exam      Right Left   Disc 3+ Pallor Pink and Sharp, mild Temporal Peripapillary atrophy   C/D Ratio 0.3 0.3   Macula Flat, No heme or edema, mild Retinal pigment epithelial mottling Epiretinal membrane, lamellar hole, no heme   Vessels Mild Vascular attenuation Mild Vascular attenuation   Periphery Attached, few scattered IRH Attached          IMAGING AND PROCEDURES  Imaging and Procedures for 07/08/17  OCT, Retina - OU - Both Eyes       Right Eye Quality was good. Central Foveal Thickness: 298. Progression has been stable. Findings include normal foveal contour, no IRF, no SRF, epiretinal membrane (Diffuse atrophy and loss of lamination).   Left Eye Quality was good. Central Foveal Thickness: 329. Progression has been stable. Findings include abnormal foveal contour, intraretinal fluid, no SRF, epiretinal membrane, pigment epithelial detachment, lamellar hole.   Notes *Images captured and stored on drive  Diagnosis / Impression:  OD: NFP, No IRF/SRF; diffuse atrophy and loss of lamination OS: ERM with lamellar macular hole and nasal cystic changes  Clinical management:  See below  Abbreviations: NFP - Normal foveal profile. CME - cystoid macular edema. PED - pigment epithelial detachment. IRF - intraretinal fluid. SRF - subretinal fluid. EZ - ellipsoid zone. ERM - epiretinal membrane. ORA - outer retinal atrophy. ORT - outer retinal tubulation. SRHM - subretinal hyper-reflective material                  ASSESSMENT/PLAN:    ICD-10-CM   1. Epiretinal membrane (ERM) of left eye H35.372   2. Lamellar macular hole of left eye H35.342   3. Retinal edema H35.81 OCT, Retina - OU - Both Eyes  4. 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 Pseudophakia of both eyes Z96.1   7. Dislocation of intraocular lens,  sequela T85.22XS   8. Left posterior capsular opacification H26.492     1,2. Epiretinal membrane w/ lamellar macular hole, OS - The natural history, anatomy, potential for loss of vision, and treatment options including vitrectomy techniques and the complications of endophthalmitis, retinal detachment, vitreous hemorrhage, cataract progression and permanent vision loss discussed with the patient. - mild ERM w/ lamellar macular hole OS - no significant change in OCT from 2017 visit with Dr. Zigmund Daniel - BCVA 20/30 OS - denies metamorphopsia - no indication for ERM surgery at this time - monitor - F/u 3-4 months  3. Mild cystic changes on OCT OS - stable - monitor  4. Mild nonproliferative diabetic retinopathy w/o DME, both eyes - The incidence, risk factors for progression, natural history and treatment options for diabetic retinopathy were discussed with patient.   - The need for close monitoring of blood glucose, blood pressure, and serum lipids, avoiding cigarette or any type of tobacco, and the need for long term follow up was also discussed with patient. - exam with minimal [place] OU   - OCT without diabetic macular edema, both eyes -  f/u in 3-4 mos  5. Optic atrophy OD - pt reports vision loss coinciding with cataract surgery OD - BCVA HM OD - stable - monitor  6,7. Pseudophakia OU - s/p CE/IOL OU at [place] with nasally displaced IOL - monitor  8. PCO OS  - S/P Yag Cap OS (04.22.19) - doing well - no significant improvement in [place] OS - completed post op PF - monitor   Ophthalmic Meds Ordered this visit:  No orders of the defined types were placed in this encounter.      Return in about 4 months (around 11/21/2017) for F/U ERM OS, DFE, OCT.  There are no Patient Instructions on file for this visit.   Explained the diagnoses, plan, and follow up with the patient and they expressed understanding.  Patient expressed understanding of the importance of  proper follow up care.   This document serves as a record of services personally performed by Gardiner Sleeper, MD, PhD. It was created on their behalf by Catha Brow, [place], a certified ophthalmic assistant. The creation of this record is the provider's dictation and/or activities during the visit.  Electronically signed by: Catha Brow, COA  07/18/2017 10:15 PM   Gardiner Sleeper, M.D., Ph.D. Diseases & Surgery of the Retina and [place] 07/21/17  I have reviewed the above documentation for accuracy and completeness, and I agree with the above. Gardiner Sleeper, M.D., Ph.D. 07/21/17 10:17 PM     Abbreviations: M myopia (nearsighted); A astigmatism; H hyperopia (farsighted); P presbyopia; Mrx spectacle prescription;  CTL contact lenses; OD right eye; OS left eye; OU both eyes  XT exotropia; ET esotropia; PEK punctate epithelial keratitis; PEE punctate epithelial erosions; DES dry eye syndrome; MGD meibomian gland dysfunction; ATs artificial tears; PFAT's preservative free artificial tears; [place] nuclear sclerotic cataract; PSC posterior subcapsular cataract; ERM epi-retinal membrane; PVD posterior vitreous detachment; RD retinal detachment; DM diabetes mellitus; DR diabetic retinopathy; NPDR non-proliferative diabetic retinopathy; PDR proliferative diabetic retinopathy; CSME clinically significant macular edema; DME diabetic macular edema; dbh dot blot hemorrhages; CWS cotton wool spot; POAG primary open angle glaucoma; C/D cup-to-disc ratio; HVF humphrey visual field; GVF goldmann visual field; OCT optical coherence tomography; IOP intraocular pressure; BRVO Branch retinal vein occlusion; CRVO central retinal vein occlusion; CRAO central retinal artery occlusion; BRAO branch retinal artery occlusion; RT retinal tear; SB scleral buckle; PPV pars plana vitrectomy; VH Vitreous hemorrhage; PRP panretinal laser photocoagulation; IVK intravitreal kenalog; VMT  vitreomacular traction; MH Macular hole;  NVD neovascularization of the disc; NVE neovascularization elsewhere; AREDS age related eye disease study; ARMD age related macular degeneration; POAG primary open angle glaucoma; EBMD epithelial/anterior basement membrane dystrophy; ACIOL anterior chamber intraocular lens; IOL intraocular lens; PCIOL posterior chamber intraocular lens; Phaco/IOL phacoemulsification with intraocular lens placement; [place] photorefractive keratectomy; LASIK laser assisted in situ keratomileusis; HTN hypertension; DM diabetes mellitus; COPD chronic obstructive pulmonary disease

## 2017-07-21 ENCOUNTER — Encounter (INDEPENDENT_AMBULATORY_CARE_PROVIDER_SITE_OTHER): Payer: Self-pay | Admitting: Ophthalmology

## 2017-07-21 ENCOUNTER — Ambulatory Visit (INDEPENDENT_AMBULATORY_CARE_PROVIDER_SITE_OTHER): Payer: Medicare HMO | Admitting: Ophthalmology

## 2017-07-21 DIAGNOSIS — T8522XS Displacement of intraocular lens, sequela: Secondary | ICD-10-CM

## 2017-07-21 DIAGNOSIS — H3581 Retinal edema: Secondary | ICD-10-CM

## 2017-07-21 DIAGNOSIS — H26492 Other secondary cataract, left eye: Secondary | ICD-10-CM

## 2017-07-21 DIAGNOSIS — H35342 Macular cyst, hole, or pseudohole, left eye: Secondary | ICD-10-CM

## 2017-07-21 DIAGNOSIS — H35372 Puckering of macula, left eye: Secondary | ICD-10-CM

## 2017-07-21 DIAGNOSIS — Z961 Presence of intraocular lens: Secondary | ICD-10-CM

## 2017-07-21 DIAGNOSIS — E113293 Type 2 diabetes mellitus with mild nonproliferative diabetic retinopathy without macular edema, bilateral: Secondary | ICD-10-CM

## 2017-07-21 DIAGNOSIS — H4720 Unspecified optic atrophy: Secondary | ICD-10-CM

## 2017-07-22 ENCOUNTER — Encounter: Payer: Self-pay | Admitting: Family Medicine

## 2017-07-22 ENCOUNTER — Other Ambulatory Visit: Payer: Self-pay | Admitting: Family Medicine

## 2017-07-22 ENCOUNTER — Ambulatory Visit (INDEPENDENT_AMBULATORY_CARE_PROVIDER_SITE_OTHER): Payer: Medicare HMO | Admitting: Family Medicine

## 2017-07-22 VITALS — BP 106/56 | HR 69 | Temp 98.6°F | Resp 16 | Ht 64.0 in | Wt 205.0 lb

## 2017-07-22 DIAGNOSIS — E1121 Type 2 diabetes mellitus with diabetic nephropathy: Secondary | ICD-10-CM

## 2017-07-22 DIAGNOSIS — IMO0002 Reserved for concepts with insufficient information to code with codable children: Secondary | ICD-10-CM

## 2017-07-22 DIAGNOSIS — E1165 Type 2 diabetes mellitus with hyperglycemia: Secondary | ICD-10-CM | POA: Diagnosis not present

## 2017-07-22 DIAGNOSIS — J449 Chronic obstructive pulmonary disease, unspecified: Secondary | ICD-10-CM | POA: Diagnosis not present

## 2017-07-22 DIAGNOSIS — K219 Gastro-esophageal reflux disease without esophagitis: Secondary | ICD-10-CM

## 2017-07-22 LAB — POCT GLYCOSYLATED HEMOGLOBIN (HGB A1C): Hemoglobin A1C: 9.9 — AB (ref ?–5.7)

## 2017-07-22 MED ORDER — RANITIDINE HCL 300 MG PO TABS: 300.0000 mg | ORAL_TABLET | Freq: Every day | ORAL | 3 refills | Status: DC

## 2017-07-22 NOTE — Patient Instructions (Addendum)
Thank you for coming to the office today.  Call insurance find cost and coverage of the following  1. Ozempic (Semaglutide injection) - start 0.25mg  weekly for 4 weeks then increase to 0.5mg  weekly - This one has best benefit of weight loss and reducing Cardiovascular events  2. Bydureon BCise (Exenatide ER) - once weekly - this is my preference, very good medicine well tolerated, less side effects of nausea, upset stomach. No dose changes. Cost and coverage is the problem, but we may be able to get it with the coupon card  3. Trulicity (Dulaglutide) - once weekly - this is very good one, usually one of my top choices as well, two doses, 0.75 (likely we would start) and 1.5 max dose. We can use coupon card here too  4. Victoza (Liraglutide) - once DAILY - 3 dose changes 0.6, 1.2 and 1.8, side effects nausea, upset stomach higher on this one but it is still very effective medicine  Try to improve diet as discussed, focus on reducing carbs / starches - sugars - reduce portion size  Drink more water regularly, and less sweet tea  Try to walk more regularly  If not improving or cannot get one of the new GLP medications then we may need to refer you to Endocrinologist Diabetic specialist.   Please schedule a Follow-up Appointment to: Return in about 3 months (around 10/22/2017) for Diabetes A1c.  If you have any other questions or concerns, please feel free to call the office or send a message through [place]. You may also schedule an earlier appointment if necessary.  Additionally, you may be receiving a survey about your experience at our office within a few days to 1 week by e-mail or mail. We value your feedback.  [name], DO [place]

## 2017-07-22 NOTE — Progress Notes (Addendum)
Subjective:    Patient ID: Lucas Caldwell, male    DOB: 1933-09-01, 84 y.o.   MRN: [id number]  [facility] STFLEUR is a 84 y.o. male presenting on 07/22/2017 for Diabetes (highest 181 and lowest 150)  History provided by patient and his wife, Lucas Caldwell  HPI   CHRONIC DM, Type 2 / CKD-III Nephropathy Recent A1c increased from 9.6 to 9.9. No further hypoglycemia episodes CBGs: Avg 180-190, lower 140-150, avg < 200 - elevated from past visit Meds:Levemir 50 u daily in AM after breakfast - has not increased, had difficulty getting medicine due to cost and tier, this was preferred option per insurance for basal insulin Reports good compliance. Tolerating well w/o side-effects Currently on ACEi Lifestyle: - Diet (not adhering to DM diet, he does not recall appropriate dietary advice, not as familiar with carbs/starches, he drinks sweet tea often, limited water) - Exercise (limited by dyspnea) - Admits chronicDM Neuropathy, was on lyrica 50mg  TID, cannot afford due to cost now - Admits numbness tingling pain in feet at times - Denies hypoglycemia, nausea vomiting, fever chills, swelling, polyuria  UPDATE COPD Breo inhaler daily improvement since last visit.   Depression screen [facility] 2/9 07/22/2017 06/13/2017 04/22/2017  Decreased Interest 0 0 0  Down, Depressed, Hopeless 0 0 0  PHQ - 2 Score 0 0 0    Social History   Tobacco Use  . Smoking status: Former Smoker    Packs/day: 2.00    Years: 15.00    Pack years: 30.00    Types: Cigarettes  . Smokeless tobacco: Former Systems developer  . Tobacco comment: quit 1979  Substance Use Topics  . Alcohol use: No    Comment: previously drank heavily - quit 1979.  . Drug use: No    Review of Systems Per HPI unless specifically indicated above     Objective:    BP (!) 106/56   Pulse 69   Temp 98.6 F (37 C) (Oral)   Resp 16   Ht 5\' 4"  (1.626 m)   Wt 205 lb (93 kg)   BMI 35.19 kg/m   Wt Readings from Last 3 Encounters:  07/22/17 205 lb (93 kg)    06/25/17 208 lb 8 oz (94.6 kg)  06/13/17 211 lb 9.6 oz (96 kg)    Physical Exam  Constitutional: [name] oriented to person, place, and time. He appears well-developed and well-nourished. No distress.  Well appearing 84 year old elderly male, comfortable, cooperative  Eyes: Right eye exhibits no discharge. Left eye exhibits no discharge.  Neck: Normal range of motion. Neck supple.  Cardiovascular: Normal rate, regular rhythm, normal heart sounds and intact distal pulses.  No murmur heard. Pulmonary/Chest: Effort normal and breath sounds normal. No respiratory distress. He has no wheezes. He has no rales.  Musculoskeletal: Normal range of motion. He exhibits no edema.  Has cane for ambulation assistance  Neurological: [name] alert and oriented to person, place, and time.  Skin: Skin is warm and dry. No rash noted. [name] not diaphoretic. No erythema.  Psychiatric: He has a normal mood and affect. His behavior is normal.  Well groomed, good eye contact, normal speech and thoughts  Nursing note and vitals reviewed.    Recent Labs    12/20/16 0430 04/22/17 1630 07/22/17 1630  HGBA1C 10.4* 9.6* 9.9*    Results for orders placed or performed in visit on 07/22/17  POCT HgB A1C  Result Value Ref Range   Hemoglobin A1C 9.9 (A) 5.7  Assessment & Plan:   Problem List Items Addressed This Visit    COPD (chronic obstructive pulmonary disease) ([place])    Improved on Breo daily maintenance inhaler Follow-up as planned may need further pulm ref / PFTs      Uncontrolled type 2 diabetes mellitus with nephropathy ([place]) - Primary    Uncontrolled DM with A1c now 9.9 from 9.6, from prior 09.4 Complications - hypoglycemia (improved), CKD-III, peripheral neuropathy, cataracts, CAD  Plan:  1. Discussed DM management concern with CKD and risk of hypoglycemia - Continue Levemir 50 u daily - advised that next step is add GLP1 - they did not pursue this last time and did not call to find cost -  concern with cost of GLP1 tho, handout given to check cost - call us back with preferred option - May adjust insulin once started GLP1 - Limited other options due to CKD - Counseling on risk of hypoglycemia and symptoms 2. Encourage improved lifestyle - long discussion on diet and food choices, carbs, limit sweet tea, inc water - he has poor health literacy on diabetes, and declines referral to Nutritionist 3. Check CBG, bring log to next visit for review - did not write down any readings, difficult to adjust his current levemir insulin today 4. Continue anticoag anti platelet, ACEi, Statin  5. Follow-up 3 months for DM A1c, med adjust  If still uncontrolled and limitation with GLP will plan to refer him to [facility] for further DM management with insulin titration for patient with CKD      Relevant Orders   POCT HgB A1C (Completed)      *UPDATE 07/23/17 after visit* - [name], Lucas Caldwell, called back with request for Trulicity (as GLP1 of choice after calling insurance) to be sent to [facility]. Will send rx today  No orders of the defined types were placed in this encounter.   Follow up plan: Return in about 3 months (around 10/22/2017) for Diabetes A1c.  [name], DO [facility] 07/23/2017, 4:18 PM

## 2017-07-23 ENCOUNTER — Telehealth: Payer: Self-pay | Admitting: *Deleted

## 2017-07-23 MED ORDER — DULAGLUTIDE 0.75 MG/0.5ML ~~LOC~~ SOAJ: 0.7500 mg | SUBCUTANEOUS | 2 refills | Status: DC

## 2017-07-23 NOTE — Addendum Note (Signed)
Addended by: [name] on: 07/23/2017 04:22 PM   Modules accepted: Orders

## 2017-07-23 NOTE — Telephone Encounter (Signed)
Received Notice of Expedited Part D Hearing for patient on Monday, Jul 28, 2017 at 1pm via telephone. In talking with Dr End, patient will be coming off of [place] in June and we may try to cancel this request if possible.   I left message with Bea Graff at the [facility] Appeals team to call back to see if someone could represent Dr End or cancel the request all together.

## 2017-07-23 NOTE — Assessment & Plan Note (Signed)
Improved on Breo daily maintenance inhaler Follow-up as planned may need further pulm ref / PFTs

## 2017-07-23 NOTE — Assessment & Plan Note (Addendum)
Uncontrolled DM with A1c now 9.9 from 9.6, from prior 54.6 Complications - hypoglycemia (improved), CKD-III, peripheral neuropathy, cataracts, CAD  Plan:  1. Discussed DM management concern with CKD and risk of hypoglycemia - Continue Levemir 50 u daily - advised that next step is add GLP1 - they did not pursue this last time and did not call to find cost - concern with cost of GLP1 tho, handout given to check cost - call us back with preferred option - May adjust insulin once started GLP1 - Limited other options due to CKD - Counseling on risk of hypoglycemia and symptoms 2. Encourage improved lifestyle - long discussion on diet and food choices, carbs, limit sweet tea, inc water - he has poor health literacy on diabetes, and declines referral to Nutritionist 3. Check CBG, bring log to next visit for review - did not write down any readings, difficult to adjust his current levemir insulin today 4. Continue anticoag anti platelet, ACEi, Statin  5. Follow-up 3 months for DM A1c, med adjust  If still uncontrolled and limitation with GLP will plan to refer him to [facility] for further DM management with insulin titration for patient with CKD

## 2017-07-24 NOTE — Telephone Encounter (Signed)
Lucas Caldwell transferred me to the the team handling this case and had to leave voicemail message.

## 2017-07-24 NOTE — Telephone Encounter (Signed)
Left voicemail message on main number requesting call back regarding this case.

## 2017-07-24 NOTE — Telephone Encounter (Signed)
Spoke with Bea Graff at the office of medicare hearings and appeals department. She said that we could email her requesting to withdraw our request for hearing regarding appeal for Brilinta. She states that once she receives that email she would place it in front of the judge for them on Monday. Email sent secure with request to withdraw request for hearing sent to [email or web address] with request of confirmation.

## 2017-07-24 NOTE — Telephone Encounter (Signed)
Spoke with Barbaraann Faster regarding case and requested to cancel this hearing.

## 2017-07-28 ENCOUNTER — Telehealth: Payer: Self-pay | Admitting: Internal Medicine

## 2017-07-28 NOTE — Telephone Encounter (Signed)
Patient calling the office for samples of medication:   1.  What medication and dosage are you requesting samples for? Brilinta 90 mg po BID   2.  Are you currently out of this medication? yes

## 2017-07-28 NOTE — Telephone Encounter (Signed)
Please advise if ok to give pt samples .

## 2017-07-28 NOTE — Telephone Encounter (Signed)
Yes, it is fine to give him enough to get to June 18th appointment. Thanks!

## 2017-07-28 NOTE — Telephone Encounter (Signed)
[id number] 9/21  #4 bottles #8 tablets

## 2017-08-06 ENCOUNTER — Telehealth: Payer: Self-pay | Admitting: Family Medicine

## 2017-08-06 DIAGNOSIS — I10 Essential (primary) hypertension: Secondary | ICD-10-CM

## 2017-08-06 DIAGNOSIS — N183 Chronic kidney disease, stage 3 unspecified: Secondary | ICD-10-CM

## 2017-08-06 MED ORDER — ENALAPRIL MALEATE 5 MG PO TABS: 5.0000 mg | ORAL_TABLET | Freq: Every day | ORAL | 3 refills | Status: DC

## 2017-08-06 NOTE — Telephone Encounter (Signed)
Refilled Enalapril to pharmacy  Patient was recently started on Trulicity 0.75mg  weekly injection.  Could you call and find out what questions or concerns she has?  [name], [facility] 08/06/2017, 12:44 PM

## 2017-08-06 NOTE — Telephone Encounter (Signed)
Pt. Called requesting a refill on Enalapril called into [facility].Also have requested that someone call her back (wife) about  Pt  Sugar (medication)

## 2017-08-07 NOTE — Telephone Encounter (Signed)
Patient advised that medication was send had no further question.

## 2017-08-12 ENCOUNTER — Encounter: Payer: Self-pay | Admitting: Family Medicine

## 2017-08-12 ENCOUNTER — Ambulatory Visit (INDEPENDENT_AMBULATORY_CARE_PROVIDER_SITE_OTHER): Payer: Medicare HMO | Admitting: Family Medicine

## 2017-08-12 VITALS — BP 122/65 | HR 69 | Temp 98.7°F | Resp 16 | Ht 64.0 in | Wt 203.0 lb

## 2017-08-12 DIAGNOSIS — E1165 Type 2 diabetes mellitus with hyperglycemia: Secondary | ICD-10-CM | POA: Diagnosis not present

## 2017-08-12 DIAGNOSIS — J449 Chronic obstructive pulmonary disease, unspecified: Secondary | ICD-10-CM | POA: Diagnosis not present

## 2017-08-12 DIAGNOSIS — E1121 Type 2 diabetes mellitus with diabetic nephropathy: Secondary | ICD-10-CM

## 2017-08-12 DIAGNOSIS — R42 Dizziness and giddiness: Secondary | ICD-10-CM | POA: Diagnosis not present

## 2017-08-12 DIAGNOSIS — IMO0002 Reserved for concepts with insufficient information to code with codable children: Secondary | ICD-10-CM

## 2017-08-12 NOTE — Patient Instructions (Addendum)
Thank you for coming to the office today.  For the iron supplement 3 times daily for now - this can help control your anemia - it seems that it is helping as your last lab test from 05/2017 was better than previous  I would keep taking it and ask Dr Janene Madeira at [facility] about your iron - as you may need a future treatment of iron infusion IV if this is not improving.  They can tell you if you need to stop it or lower dose.  They should check your blood count or CBC (to check hemoglobin or anemia level)  ----- May try to hold the Gabapentin to see if this is causing some of your dizziness as a medication side effect.  For blood sugar - unfortunately, since you cannot get the other medicine Trulicity.  Recommend to continue using Levemir - may increase up to 60 units if you need as MAX dose - then next you may need to [facility] to Levemir one dose twice daily in future, we will discuss if need  For memory I would recommend returning for Mental Status Examination for Memory Loss  Please schedule a Follow-up Appointment to: Return in about 1 month (around 09/09/2017) for Memory Loss MMSE.  If you have any other questions or concerns, please feel free to call the office or send a message through [place]. You may also schedule an earlier appointment if necessary.  Additionally, you may be receiving a survey about your experience at our office within a few days to 1 week by e-mail or mail. We value your feedback.  [name], DO [place]

## 2017-08-12 NOTE — Progress Notes (Signed)
Subjective:    Patient ID: Lucas Caldwell, male    DOB: [birth year], 84 y.o.   MRN: [id number]  Lucas Caldwell is a 84 y.o. male presenting on 08/12/2017 for Fatigue (nausea, dizzy onset 2 week)  Patient presents for a same day appointment. Patient provides history also accompanied by wife, [name], who provides additional history.  HPI   DIZZINESS / UNBALANCED / MEMORY LOSS / DIABETES Reports symptoms started about 3 weeks ago, seems to be gradually worsening, describes symptoms as "swimmy headed" and "dizziness", usually is more constant, worse with standing position change from seated. Has some generalized weakness he reports in changing position, uses cane for ambulation, has not fallen but admits he is concerned about potential fall - He has a truck and tries to work regularly but this is interfering with him - Recent new medicine Trulicity 0.75mg  weekly was rx about 4 weeks ago, he could not get because of medicare donut hole, still using Levemir 50 units daily, asking about higher dose, has high sugars, last visit 07/2017 for DM, [name] avoiding sweets, drinks plenty of water, has good appetite - [name] taking iron supplement TID, asking if he can stop med, last lab Hgb was 11.4 in 06/2017 per [facility] Hem[facility], Followed by [facility], within this week in 2 days, he has blood level coming up for coumadin check as well. - Additional complaint dyspnea he was started on Breo recently and notices some improvement has history of presumed COPD - Denies vertigo symptoms, chest pain, worsening dyspnea, syncope or near syncope, headache, hearing loss   Depression screen [facility] 2/9 07/22/2017 06/13/2017 04/22/2017  Decreased Interest 0 0 0  Down, Depressed, Hopeless 0 0 0  PHQ - 2 Score 0 0 0    Social History   Tobacco Use  . Smoking status: Former Smoker    Packs/day: 2.00    Years: 15.00    Pack years: 30.00    Types: Cigarettes  . Smokeless tobacco: Former Systems developer  . Tobacco comment: quit  1979  Substance Use Topics  . Alcohol use: No    Comment: previously drank heavily - quit 1979.  . Drug use: No    Review of Systems Per HPI unless specifically indicated above     Objective:    BP 122/65   Pulse 69   Temp 98.7 F (37.1 C) (Oral)   Resp 16   Ht 5\' 4"  (1.626 m)   Wt 203 lb (92.1 kg)   BMI 34.84 kg/m   Wt Readings from Last 3 Encounters:  08/12/17 203 lb (92.1 kg)  07/22/17 205 lb (93 kg)  06/25/17 208 lb 8 oz (94.6 kg)    Physical Exam  Constitutional: [name] oriented to person, place, and time. He appears well-developed and well-nourished. No distress.  Well appearing 84 year old elderly male, comfortable, cooperative  Eyes: Right eye exhibits no discharge. Left eye exhibits no discharge.  Neck: Normal range of motion. Neck supple.  Cardiovascular: Normal rate, regular rhythm, normal heart sounds and intact distal pulses.  No murmur heard. Pulmonary/Chest: Effort normal and breath sounds normal. No respiratory distress. He has no wheezes. He has no rales.  Musculoskeletal: Normal range of motion. He exhibits no edema.  Has cane for ambulation assistance  Difficulty with proximal muscle weakness, some difficulty standing from seated with cane unassisted but able to do so  Neurological: [name] alert and oriented to person, place, and time.  Skin: Skin is warm  and dry. No rash noted. [name] not diaphoretic. No erythema.  Psychiatric: He has a normal mood and affect. His behavior is normal.  Well groomed, good eye contact, normal speech and thoughts  Nursing note and vitals reviewed.  Results for orders placed or performed in visit on 07/22/17  POCT HgB A1C  Result Value Ref Range   Hemoglobin A1C 9.9 (A) 5.7      Assessment & Plan:   Problem List Items Addressed This Visit    COPD (chronic obstructive pulmonary disease) ([place])   Uncontrolled type 2 diabetes mellitus with nephropathy ([place])    Other Visit Diagnoses    Dizziness    -  Primary        Clinically with constellation of symptoms, primarily constant dizziness, some provoking factors but difficult to narrow down, and not entirely consistent with vertigo by history and exam. Currently fairly well appearing and hemodynamically stable. He has multiple chronic co morbid conditions may be affecting him, admits some dark stools on iron supplement as well but no recent change, currently on chemo per [facility] Heme[facility]Onc, has had iron levels monitored and recent Hgb stable. - Also consider dyspnea related to COPD, that is now on Breo but some improvement, uncertain if resolving. Also followed by Cardiology on anticoagulation  Plan - For dizziness - may try to hold Gabapentin temporarily to see if it is med side effect, will reconsider vertigo in future, alternatively can refer to Neuro if not improving - For COPD/ Dyspnea - should continue Breo for longer as maintenance since improving, in future may switch to alternative or consider pulm for PFTs - For history of cancer and anemia, should f/u with [facility] Heme/Onc - discuss Hemoglobin and iron supplement with them,  May need to stop and switch to IV iron if cannot tolerate oral iron,. Likely cause of dark stools as well. Deferred repeat CBC today since he is scheduled this week for labs - cannot rule out bleeding but advised that he should proceed with currently scheduled follow-up for lab work on this. - If any acute worsening dizziness chest pain dyspnea syncope or other return criteria he should go directly to hospital ED for acute evaluation  For Diabetes - he may consider dose up to Levemir 60u daily - then if need higher will have to split dose for BID dosing, advised that similar to last visit 4 weeks ago for DM poorly controlled, my recommendation is to add GLP1 agent due to his CKD, however cannot afford and in donut hole, therefore I advised that we have limited options currently - we may be able to get sample of GLP1 in future or consider referral  to Endocrinology if need  Lastly he has new complaint of memory loss, describes only minor situations and forgetfulness, advised that cannot address this issue today, he will return within 4 weeks for Memory Loss and MMSE testing in office, then we can triage this issue further, f/u on dizziness  No orders of the defined types were placed in this encounter.   Follow up plan: Return in about 1 month (around 09/09/2017) for Memory Loss MMSE.  [name], [facility] 08/12/2017, 10:44 PM

## 2017-08-13 ENCOUNTER — Ambulatory Visit (INDEPENDENT_AMBULATORY_CARE_PROVIDER_SITE_OTHER): Payer: Medicare HMO

## 2017-08-13 DIAGNOSIS — Z7901 Long term (current) use of anticoagulants: Secondary | ICD-10-CM | POA: Diagnosis not present

## 2017-08-13 DIAGNOSIS — I480 Paroxysmal atrial fibrillation: Secondary | ICD-10-CM | POA: Diagnosis not present

## 2017-08-13 LAB — POCT INR: INR: 3 (ref 2.0–3.0)

## 2017-08-13 NOTE — Patient Instructions (Signed)
Please continue taking 1.5 tablets daily.  Recheck INR in 6 weeks.

## 2017-08-19 DIAGNOSIS — E1142 Type 2 diabetes mellitus with diabetic polyneuropathy: Secondary | ICD-10-CM | POA: Diagnosis not present

## 2017-08-19 DIAGNOSIS — B351 Tinea unguium: Secondary | ICD-10-CM | POA: Diagnosis not present

## 2017-08-22 ENCOUNTER — Encounter: Payer: Self-pay | Admitting: Family Medicine

## 2017-08-22 ENCOUNTER — Ambulatory Visit (INDEPENDENT_AMBULATORY_CARE_PROVIDER_SITE_OTHER): Payer: Medicare HMO | Admitting: Family Medicine

## 2017-08-22 VITALS — BP 127/70 | HR 63 | Temp 98.5°F | Resp 16 | Ht 64.0 in | Wt 202.0 lb

## 2017-08-22 DIAGNOSIS — J441 Chronic obstructive pulmonary disease with (acute) exacerbation: Secondary | ICD-10-CM

## 2017-08-22 MED ORDER — AMOXICILLIN-POT CLAVULANATE 875-125 MG PO TABS: 1.0000 | ORAL_TABLET | Freq: Two times a day (BID) | ORAL | 0 refills | Status: DC

## 2017-08-22 MED ORDER — PREDNISONE 50 MG PO TABS: 50.0000 mg | ORAL_TABLET | Freq: Every day | ORAL | 0 refills | Status: DC

## 2017-08-22 MED ORDER — BENZONATATE 100 MG PO CAPS: 100.0000 mg | ORAL_CAPSULE | Freq: Three times a day (TID) | ORAL | 0 refills | Status: DC | PRN

## 2017-08-22 NOTE — Progress Notes (Signed)
Subjective:    Patient ID: Lucas Caldwell, male    DOB: [birth year], 84 y.o.   MRN: [id number]  Lucas Caldwell is a 84 y.o. male presenting on 08/22/2017 for COPD (no energy, exhausted onset week nasal congestion)  Patient presents for a same day appointment. Wife, [name], here to accompany him provides additional history.  HPI   ACUTE COPD EXACERBATION Reports symptoms started about 1 week ago with rhinorrhea and nasal congestion and then persistent coughing - Takes Tylenol with some relief of feeling more energy - History of COPD, using Breo inhaler daily, and using Albuterol 1-2x daily for past week with some temporary relief - Admits tired "run down" no energy - Seems to have some improved dizziness from before - Denies fevers or chills or sweats, nausea vomiting, abdominal pain, chest pain, edema or swelling  Depression screen [facility] 2/9 07/22/2017 06/13/2017 04/22/2017  Decreased Interest 0 0 0  Down, Depressed, Hopeless 0 0 0  PHQ - 2 Score 0 0 0    Social History   Tobacco Use  . Smoking status: Former Smoker    Packs/day: 2.00    Years: 15.00    Pack years: 30.00    Types: Cigarettes  . Smokeless tobacco: Former Systems developer  . Tobacco comment: quit 1979  Substance Use Topics  . Alcohol use: No    Comment: previously drank heavily - quit 1979.  . Drug use: No    Review of Systems Per HPI unless specifically indicated above     Objective:    BP 127/70   Pulse 63   Temp 98.5 F (36.9 C) (Oral)   Resp 16   Ht 5\' 4"  (1.626 m)   Wt 202 lb (91.6 kg)   SpO2 100%   BMI 34.67 kg/m   Wt Readings from Last 3 Encounters:  08/22/17 202 lb (91.6 kg)  08/12/17 203 lb (92.1 kg)  07/22/17 205 lb (93 kg)    Physical Exam  Constitutional: [name] oriented to person, place, and time. He appears well-developed and well-nourished. No distress.  Currently mildly tired but not ill appearing 84 year old elderly male, cooperative  HENT:  Head: Normocephalic and atraumatic.    Mouth/Throat: Oropharynx is clear and moist.  Frontal / maxillary sinuses non-tender. Nares patent without purulence or edema. Bilateral TMs clear without erythema, effusion or bulging. Oropharynx clear without erythema, exudates, edema or asymmetry.  Eyes: Right eye exhibits no discharge. Left eye exhibits no discharge.  Neck: Normal range of motion. Neck supple.  Cardiovascular: Normal rate, regular rhythm, normal heart sounds and intact distal pulses.  No murmur heard. Pulmonary/Chest: Effort normal. No respiratory distress. He has wheezes. He has no rales.  Occasional cough. Reduced air movement diffusely, upper lung fields with mild rhonchi or coarse breath sound. Speaks full sentence  Musculoskeletal: Normal range of motion. He exhibits no edema.  Neurological: [name] alert and oriented to person, place, and time.  Skin: Skin is warm and dry. No rash noted. [name] not diaphoretic. No erythema.  Psychiatric: He has a normal mood and affect. His behavior is normal.  Well groomed, good eye contact, normal speech and thoughts  Nursing note and vitals reviewed.  Results for orders placed or performed in visit on 08/13/17  POCT INR  Result Value Ref Range   INR 3.0 2.0 - 3.0      Assessment & Plan:   Problem List Items Addressed This Visit    COPD (chronic obstructive pulmonary disease) (  [place]) - Primary   Relevant Medications   predniSONE (DELTASONE) 50 MG tablet   benzonatate (TESSALON) 100 MG capsule   amoxicillin-clavulanate (AUGMENTIN) 875-125 MG tablet      Consistent with mild acute exacerbation of COPD with worsening productive cough. Sick contact maybe initial viral vs bronchitis to start, now seems some worsening. - No hypoxia (100% on RA), afebrile, no recent hospitalization - Continues Breo maintenance and Albuterol PRN  Plan: 1. Start Augmentin BID x 10 days (cannot take other antibiotics due to warfarin) and start Prednisone 50mg  x 5 day steroid burst 2. Use albuterol  q 4 hr regularly x 2-3 days. Continue maintenance inhalers - Start Tessalon Perls take 1 capsule up to 3 times a day as needed for cough - Deferred CXR - offered, but clinically no focal abnormality may return if not improve 3. RTC about 1 week if not improving, otherwise strict return criteria to go to ED   Meds ordered this encounter  Medications  . predniSONE (DELTASONE) 50 MG tablet    Sig: Take 1 tablet (50 mg total) by mouth daily with breakfast.    Dispense:  5 tablet    Refill:  0  . benzonatate (TESSALON) 100 MG capsule    Sig: Take 1 capsule (100 mg total) by mouth 3 (three) times daily as needed for cough.    Dispense:  30 capsule    Refill:  0  . amoxicillin-clavulanate (AUGMENTIN) 875-125 MG tablet    Sig: Take 1 tablet by mouth 2 (two) times daily.    Dispense:  20 tablet    Refill:  0    Follow up plan: Return in about 1 week (around 08/29/2017), or if symptoms worsen or fail to improve, for COPD.  [name], [facility] 08/22/2017, 3:56 PM

## 2017-08-22 NOTE — Patient Instructions (Addendum)
Thank you for coming to the office today.  1. It sounds like you had an Upper Respiratory Virus that has settled into a Bronchitis, lower respiratory tract infection. I don't have concerns for pneumonia today, and think that this should gradually improve. Once you are feeling better, the cough may take a few weeks to fully resolve. I do hear wheezing and coarse breath sounds, this may be due to the virus, also could be related to smoking.  Start Augmentin antibiotic twice daily for 10 days  - Start Prednisone 50mg  daily for next 5 days - this will open up lungs allow you to breath better and treat that wheezing or bronchospasm  - Use Albuterol inhaler 2 puffs every 4-6 hours around the clock for next 2-3 days, max up to 5 days then use as needed   Start Tessalon Perls take 1 capsule up to 3 times a day as needed for cough  - Use nasal saline (Simply Saline or Ocean Spray) to flush nasal congestion multiple times a day, may help cough - Drink plenty of fluids to improve congestion  If your symptoms seem to worsen instead of improve over next several days, including significant fever / chills, worsening shortness of breath, worsening wheezing, or nausea / vomiting and can't take medicines - return sooner or go to hospital Emergency Department for more immediate treatment.   Please schedule a Follow-up Appointment to: Return in about 1 week (around 08/29/2017), or if symptoms worsen or fail to improve, for COPD.  If you have any other questions or concerns, please feel free to call the office or send a message through [place]. You may also schedule an earlier appointment if necessary.  Additionally, you may be receiving a survey about your experience at our office within a few days to 1 week by e-mail or mail. We value your feedback.  [name], DO [place]

## 2017-08-25 ENCOUNTER — Other Ambulatory Visit: Admit: 2017-08-25 | Discharge: 2017-08-26 | Payer: MEDICARE

## 2017-08-25 ENCOUNTER — Ambulatory Visit: Admit: 2017-08-25 | Discharge: 2017-08-26 | Payer: MEDICARE

## 2017-08-25 ENCOUNTER — Ambulatory Visit: Admit: 2017-08-25 | Discharge: 2017-08-26 | Payer: MEDICARE | Attending: Oncology | Primary: Oncology

## 2017-08-25 DIAGNOSIS — C9210 Chronic myeloid leukemia, BCR/ABL-positive, not having achieved remission: Principal | ICD-10-CM

## 2017-08-25 DIAGNOSIS — Z79899 Other long term (current) drug therapy: Secondary | ICD-10-CM | POA: Diagnosis not present

## 2017-08-25 DIAGNOSIS — J069 Acute upper respiratory infection, unspecified: Secondary | ICD-10-CM | POA: Diagnosis not present

## 2017-08-25 DIAGNOSIS — Z7901 Long term (current) use of anticoagulants: Secondary | ICD-10-CM | POA: Diagnosis not present

## 2017-08-25 DIAGNOSIS — Z888 Allergy status to other drugs, medicaments and biological substances status: Secondary | ICD-10-CM | POA: Diagnosis not present

## 2017-08-25 DIAGNOSIS — Z881 Allergy status to other antibiotic agents status: Secondary | ICD-10-CM | POA: Diagnosis not present

## 2017-08-25 DIAGNOSIS — C9211 Chronic myeloid leukemia, BCR/ABL-positive, in remission: Secondary | ICD-10-CM | POA: Diagnosis not present

## 2017-08-27 ENCOUNTER — Ambulatory Visit
Admission: RE | Admit: 2017-08-27 | Discharge: 2017-08-27 | Disposition: A | Discharge status: Home / Self Care | Payer: Medicare HMO | Source: Ambulatory Visit | Attending: Family Medicine | Admitting: Family Medicine

## 2017-08-27 ENCOUNTER — Encounter: Payer: Self-pay | Admitting: Family Medicine

## 2017-08-27 ENCOUNTER — Ambulatory Visit (INDEPENDENT_AMBULATORY_CARE_PROVIDER_SITE_OTHER): Payer: Medicare HMO | Admitting: Family Medicine

## 2017-08-27 VITALS — BP 117/58 | HR 59 | Temp 97.9°F | Resp 16 | Ht 64.0 in | Wt 195.0 lb

## 2017-08-27 DIAGNOSIS — J432 Centrilobular emphysema: Secondary | ICD-10-CM | POA: Diagnosis not present

## 2017-08-27 DIAGNOSIS — R0600 Dyspnea, unspecified: Secondary | ICD-10-CM | POA: Diagnosis not present

## 2017-08-27 DIAGNOSIS — R05 Cough: Secondary | ICD-10-CM | POA: Diagnosis not present

## 2017-08-27 DIAGNOSIS — I700 Atherosclerosis of aorta: Secondary | ICD-10-CM | POA: Insufficient documentation

## 2017-08-27 DIAGNOSIS — R0602 Shortness of breath: Secondary | ICD-10-CM | POA: Diagnosis not present

## 2017-08-27 DIAGNOSIS — J441 Chronic obstructive pulmonary disease with (acute) exacerbation: Secondary | ICD-10-CM | POA: Diagnosis not present

## 2017-08-27 MED ORDER — PREDNISONE 50 MG PO TABS: 50.0000 mg | ORAL_TABLET | Freq: Every day | ORAL | 0 refills | Status: DC

## 2017-08-27 NOTE — Progress Notes (Signed)
Subjective:    Patient ID: Lucas Caldwell, male    DOB: 07/30/1933, 84 y.o.   MRN: [id number]  Lucas Caldwell is a 84 y.o. male presenting on 08/27/2017 for COPD  Patient is accompanied by wife, [name], she provides additional history.  HPI   FOLLOW-UP ACUTE COPD EXACERBATION - Last visit with me 08/22/17, for initial visit for same problem acute COPD worsening, treated empirically with antibiotics also for COPD with prednisone, he is on warfarin for anticoagulation and cannot take most antibiotics due to interaction, he was treated with Augmentin among other supportive care, continued Breo, Albuterol, Tessalon, he has sick contact with wife, see prior notes for background information. - Interval update with notable improvement overall on treatment, he did call yesterday stating not completely improved wanted to get chest x-ray - Today patient reports he improved more initially cough was reduced and breathing better, but still has difficulty "clearing phlegm" has not tried mucinex, finishing augmentin antibiotic seems to do better - [name] sleeping well  - Still has some occasional dizziness - energy seems to be improved - Using Breo and Albuterol PRN still - History of CHF, and has history of edema, he has not had worsening swelling and now doing well with regards to fluid, weight is down several lbs since last visit - Denies fevers or chills or sweats, nausea vomiting, abdominal pain, chest pain, edema or swelling  Depression screen [facility] 2/9 08/27/2017 07/22/2017 06/13/2017  Decreased Interest 0 0 0  Down, Depressed, Hopeless 0 0 0  PHQ - 2 Score 0 0 0    Social History   Tobacco Use  . Smoking status: Former Smoker    Packs/day: 2.00    Years: 15.00    Pack years: 30.00    Types: Cigarettes  . Smokeless tobacco: Former Systems developer  . Tobacco comment: quit 1979  Substance Use Topics  . Alcohol use: No    Comment: previously drank heavily - quit 1979.  . Drug use: No    Review of  Systems Per HPI unless specifically indicated above     Objective:    BP (!) 117/58   Pulse (!) 59   Temp 97.9 F (36.6 C) (Oral)   Resp 16   Ht 5\' 4"  (1.626 m)   Wt 195 lb (88.5 kg)   SpO2 100%   BMI 33.47 kg/m   Wt Readings from Last 3 Encounters:  08/27/17 195 lb (88.5 kg)  08/22/17 202 lb (91.6 kg)  08/12/17 203 lb (92.1 kg)    Physical Exam  Constitutional: [name] oriented to person, place, and time. He appears well-developed and well-nourished. No distress.  Currently appears to be less tired, not ill appearing 84 year old elderly male, cooperative  HENT:  Head: Normocephalic and atraumatic.  Mouth/Throat: Oropharynx is clear and moist.  Oropharynx clear without erythema, exudates, edema or asymmetry.  Eyes: Right eye exhibits no discharge. Left eye exhibits no discharge.  Neck: Normal range of motion. Neck supple.  Cardiovascular: Normal rate, regular rhythm, normal heart sounds and intact distal pulses.  No murmur heard. Pulmonary/Chest: Effort normal. No respiratory distress. He has wheezes. He has no rales.  No cough today. Improved air movement diffusely. No focal rhonchi heard, possibly mild crackles L side > R but not on repeat auscultation. He speaks full sentences. No overt wheezing.  Musculoskeletal: Normal range of motion. He exhibits no edema (Improved and no edema lower ext).  Neurological: [name] alert and oriented to  person, place, and time.  Skin: Skin is warm and dry. No rash noted. [name] not diaphoretic. No erythema.  Psychiatric: He has a normal mood and affect. His behavior is normal.  Well groomed, good eye contact, normal speech and thoughts  Nursing note and vitals reviewed.  I have personally reviewed the radiology report from STAT CXR on 08/27/17.  CLINICAL DATA:  COPD. Emphysema. Shortness of breath, 3-4 weeks duration.  EXAM: CHEST - 2 VIEW  COMPARISON:  12/18/2016  FINDINGS: Heart size is normal. There is chronic aortic  atherosclerosis. The lungs are clear. The vascularity is normal. No infiltrate, mass, effusion or collapse. No hyperinflation.  IMPRESSION: No active disease.  Aortic atherosclerosis.   Electronically Signed   By: Nelson Chimes M.D.   On: 08/27/2017 15:01  Results for orders placed or performed in visit on 08/13/17  POCT INR  Result Value Ref Range   INR 3.0 2.0 - 3.0      Assessment & Plan:   Problem List Items Addressed This Visit    COPD (chronic obstructive pulmonary disease) ([place])   Relevant Medications   predniSONE (DELTASONE) 50 MG tablet   Other Relevant Orders   DG Chest 2 View (Completed)    Other Visit Diagnoses    COPD with acute exacerbation ([place])    -  Primary   Relevant Medications   predniSONE (DELTASONE) 50 MG tablet   Other Relevant Orders   DG Chest 2 View (Completed)      Meds ordered this encounter  Medications  . predniSONE (DELTASONE) 50 MG tablet    Sig: Take 1 tablet (50 mg total) by mouth daily with breakfast. For 3 more days    Dispense:  3 tablet    Refill:  0   Significantly improved mild acute exacerbation of COPD, with residual still difficulty clearing chest congestion No focal sign of infection No hypoxia (100% on RA), afebrile, no recent hospitalization - Continues Breo maintenance and Albuterol PRN  Plan: - Check STAT CXR Today - patient waited for result, see above reviewed results with him, negative CXR did not show infiltrate or fluid, reassuring - Finish Augmentin BID - last 3-4 days of 10 day course - ADD 3 more days Prednisone 50mg  daily since he felt best on this medication still need improve clearing congestion - Continue albuterol q 4 hr regularly x 2-3 days. Continue maintenance inhalers - Start Mucinex - sample given plain mucinex without DM 7-10 days Only return if not improving, otherwise strict return criteria to go to ED   Follow up plan: Return if symptoms worsen or fail to improve, for COPD /  cough.  [name], [facility] 08/27/2017, 4:02 PM

## 2017-08-27 NOTE — Patient Instructions (Addendum)
Thank you for coming to the office today.  I think you are improving on the treatment for COPD  Finish Augmentin antibiotic - 4 more days  Will check X-ray today - call you with results  Will likely add more Prednisone for 3 days   May try OTC Mucinex up to 7-10 days then stop  Continue Breo and Albuterol as needed  - Drink plenty of fluids to improve congestion  If your symptoms seem to worsen instead of improve over next several days, including significant fever / chills, worsening shortness of breath, worsening wheezing, or nausea / vomiting and can't take medicines - return sooner or go to hospital Emergency Department for more immediate treatment.  Please schedule a Follow-up Appointment to: Return if symptoms worsen or fail to improve, for COPD / cough.  If you have any other questions or concerns, please feel free to call the office or send a message through [place]. You may also schedule an earlier appointment if necessary.  Additionally, you may be receiving a survey about your experience at our office within a few days to 1 week by e-mail or mail. We value your feedback.  [name], DO [place]

## 2017-09-02 ENCOUNTER — Encounter: Payer: Self-pay | Admitting: Nurse Practitioner

## 2017-09-02 ENCOUNTER — Ambulatory Visit (INDEPENDENT_AMBULATORY_CARE_PROVIDER_SITE_OTHER): Payer: Medicare HMO | Admitting: Nurse Practitioner

## 2017-09-02 VITALS — BP 110/60 | HR 75 | Ht 66.0 in | Wt 197.0 lb

## 2017-09-02 DIAGNOSIS — E785 Hyperlipidemia, unspecified: Secondary | ICD-10-CM | POA: Diagnosis not present

## 2017-09-02 DIAGNOSIS — I480 Paroxysmal atrial fibrillation: Secondary | ICD-10-CM | POA: Diagnosis not present

## 2017-09-02 DIAGNOSIS — N184 Chronic kidney disease, stage 4 (severe): Secondary | ICD-10-CM | POA: Diagnosis not present

## 2017-09-02 DIAGNOSIS — I5022 Chronic systolic (congestive) heart failure: Secondary | ICD-10-CM | POA: Diagnosis not present

## 2017-09-02 DIAGNOSIS — I255 Ischemic cardiomyopathy: Secondary | ICD-10-CM

## 2017-09-02 DIAGNOSIS — I10 Essential (primary) hypertension: Secondary | ICD-10-CM

## 2017-09-02 DIAGNOSIS — I2510 Atherosclerotic heart disease of native coronary artery without angina pectoris: Secondary | ICD-10-CM

## 2017-09-02 MED ORDER — ASPIRIN EC 81 MG PO TBEC: 81.0000 mg | DELAYED_RELEASE_TABLET | Freq: Every day | ORAL | 3 refills | Status: DC

## 2017-09-02 NOTE — Progress Notes (Addendum)
Office Visit    Patient Name: Lucas Caldwell Date of Encounter: 09/02/2017  Primary Care Provider:  [name], DO Primary Cardiologist:  [name], MD  Chief Complaint    84 year old male with a history of CAD, mixed ischemic and nonischemic cardiomyopathy, hypertension, hyperlipidemia, type 2 diabetes mellitus, HFrEF, paroxysmal atrial fibrillation on chronic Coumadin, stage III-IV chronic kidney disease, CML, iron deficiency anemia, BPH, and hypothyroidism, who presents for follow-up.  Past Medical History    Past Medical History:  Diagnosis Date  . Arthritis    "probably in his legs" (08/15/2016)  . Asthma   . Blind right eye   . BPH (benign prostatic hyperplasia)   . Breast asymmetry    Left breast is larger, present for several years.  [name] CAD (coronary artery disease)    a. Cath in the late 90's - reportedly ok;  b. 2014 s/p stenting x 2 @ [facility]; c 08/19/16 Cath/PCI with DES -> RCA, plan to treat LM 70% medically. Seen by surgery and felt to be too high risk for CABG.  . Chronic combined systolic (congestive) and diastolic (congestive) heart failure ([place])    a. Previously reduced EF-->50% by echo in 2012;  b. 06/2015 Echo: EF 50-55%  c. 07/2016 Echo: EF 45-50%.  . Chronic kidney disease (CKD), stage IV (severe) ([place])   . CML (chronic myelocytic leukemia) ([place])   . GERD (gastroesophageal reflux disease)   . Gout   . Hyperlipemia   . Hypertension   . Hypothyroidism   . Iron deficiency anemia   . Migraine    "in the 1960s" (08/15/2016)  . NICM (nonischemic cardiomyopathy) ([place])    a. Previously reduced EF-->50% by echo in 2012;  b. 06/2015 Echo: EF 50-55%, Gr2 DD, mild MR, mildly dil LA, Ao sclerosis, mild TR;  c. 07/2016 Echo: EF 45-50%, ? inf HK, mild MR, mildly dil LA.  [name] PAF (paroxysmal atrial fibrillation) (H[facility])    a. ? Dx 2014-->s/p DCCV;  b. CHA2DS2VASc = 6-->chronic coumadin.  . Prostate cancer ([place])   . RBBB   . Type II diabetes mellitus ([place])      Past Surgical History:  Procedure Laterality Date  . CATARACT EXTRACTION W/ INTRAOCULAR LENS  IMPLANT, BILATERAL Bilateral   . CORONARY ANGIOPLASTY WITH STENT PLACEMENT  09/2012   2 stents  . CORONARY STENT INTERVENTION N/A 08/19/2016   Procedure: Coronary Stent Intervention;  Surgeon: [name], MD;  Location: [facility];  Service: Cardiovascular;  Laterality: N/A;  . ESOPHAGOGASTRODUODENOSCOPY N/A 12/20/2016   Procedure: ESOPHAGOGASTRODUODENOSCOPY (EGD);  Surgeon: Lin Landsman, MD;  Location: [facility] ENDOSCOPY;  Service: Gastroenterology;  Laterality: N/A;  . HERNIA REPAIR     "navel"  . LEFT HEART CATH AND CORONARY ANGIOGRAPHY N/A 08/14/2016   Procedure: Left Heart Cath and Coronary Angiography;  Surgeon: [name], MD;  Location: [facility];  Service: Cardiovascular;  Laterality: N/A;  . TOE AMPUTATION Right    "big toe"    Allergies  Allergies  Allergen Reactions  . Clopidogrel Anaphylaxis    Other reaction(s): ANAPHYLAXIS;altered mental status;  (electrolytes "out of wack" and confusion per family; THEY DO NOT REMEMBER ANY OTHER REACTION)- MSB 10/10/15  . Ciprofloxacin Itching  . Spironolactone     Other reaction(s): Other (See Comments) gynecomastia    History of Present Illness    84 year old male with the above complex past medical history including CAD, mixed ischemic and nonischemic cardiomyopathy, hypertension, hyperlipidemia, type 2 diabetes  mellitus, HFrEF, paroxysmal atrial fibrillation on chronic Coumadin, stage III-IV chronic kidney disease, CML, iron deficiency anemia, BPH, and hypothyroidism.  The cardiac history dates back to the late 1990s, he was more recently found to have LV dysfunction in May 2018 in the setting of  exertional dyspnea.  EF was 45 to 50% at that time.  Catheterization showed 70% left main disease with severe right coronary artery disease.  He was turned down for CT surgery at [facility] and subsequently underwent  PCI drug-eluting stent placement to the right coronary artery.  The left main has been medically managed since.  Earlier this year, he had issues with volume overload resulting in an initial attempt at titrating Lasix followed by a switch to torsemide.  We had to scale back torsemide dosing to 20 mg daily in the setting of worsening renal insufficiency.  He was subsequently placed on Breo  by primary care in the setting of ongoing dyspnea and noted significant improvement.  He was last seen in clinic here in April, at which time he was doing well.  Since then, he says he has never felt so good.  He has been able to be more active than usual and has not been experiencing any chest pain, dyspnea, PND, orthopnea, dizziness, syncope, edema, or early satiety.  His weight is 197 pounds today, down from 208 pounds when he was last seen here on April 10.  Home Medications    Prior to Admission medications   Medication Sig Start Date End Date Taking? Authorizing Provider  acetaminophen (TYLENOL) 500 MG tablet Take 500 mg by mouth every 6 (six) hours as needed.   Yes [provider]  albuterol (PROVENTIL HFA;VENTOLIN HFA) 108 (90 Base) MCG/ACT inhaler Inhale 2 puffs into the lungs every 6 (six) hours as needed for wheezing or shortness of breath. 05/13/17  Yes Theora Gianotti, NP  Alcohol Swabs 70 % PADS Use when checking blood sugar up to 3x daily 04/09/17  Yes Karamalegos, [name], DO  allopurinol (ZYLOPRIM) 100 MG tablet Take 100 mg by mouth daily. 02/03/17  Yes [provider]  amoxicillin-clavulanate (AUGMENTIN) 875-125 MG tablet Take 1 tablet by mouth 2 (two) times daily. 08/22/17  Yes Karamalegos, [name], DO  benzonatate (TESSALON) 100 MG capsule Take 1 capsule (100 mg total) by mouth 3 (three) times daily as needed for cough. 08/22/17  Yes Karamalegos, [name], DO  bosutinib (BOSULIF) 100 MG tablet Take 200 mg by mouth at bedtime. Take with food.    Yes [provider]  enalapril (VASOTEC) 5 MG tablet Take 1 tablet (5 mg total) by mouth daily. 08/06/17  Yes Karamalegos, [name], DO  famotidine (PEPCID) 20 MG tablet Take 20 mg by mouth at bedtime as needed for heartburn. Take 2 hours after chemo pill.   Yes [provider]  fenofibrate 160 MG tablet Take 1 tablet (160 mg total) by mouth daily. 08/21/16  Yes Reino Bellis B, NP  ferrous sulfate 325 (65 FE) MG EC tablet Take 1 tablet (325 mg total) by mouth 3 (three) times daily with meals. 01/08/17  Yes End, [name], MD  fluticasone furoate-vilanterol (BREO ELLIPTA) 100-25 MCG/INH AEPB Inhale 1 puff into the lungs daily. 06/13/17  Yes Karamalegos, [name], DO  gabapentin (NEURONTIN) 300 MG capsule Take 1 capsule (300 mg total) by mouth 2 (two) times daily. 06/23/17  Yes Karamalegos, [name], DO  Insulin Detemir (LEVEMIR FLEXTOUCH) 100 UNIT/ML Pen Inject 50 Units into the skin daily.  After breakfast 06/19/17  Yes Karamalegos, [name], DO  Insulin Pen Needle 31G X 5 MM MISC 31 g by Does not apply route as directed. 04/16/17  Yes [name], NP  isosorbide mononitrate (IMDUR) 60 MG 24 hr tablet Take 1 tablet (60 mg total) by mouth daily. 03/25/17  Yes End, [name], MD  levothyroxine (SYNTHROID, LEVOTHROID) 50 MCG tablet Take 50 mcg by mouth daily before breakfast.   Yes [provider]  loperamide (IMODIUM A-D) 2 MG tablet Take 2 mg by mouth 3 (three) times daily as needed for diarrhea or loose stools.    Yes [provider]  magnesium oxide (MAG-OX) 400 MG tablet Take 1 tablet (400 mg total) by mouth 2 (two) times daily. 07/16/17  Yes Karamalegos, [name], DO  metoprolol succinate (TOPROL-XL) 50 MG 24 hr tablet Take 1 tablet (50 mg total) by mouth every morning. 06/24/17  Yes Karamalegos, [name], DO  Multiple Vitamins-Minerals (MULTIVITAMIN ADULTS 50+ PO) Take 1 tablet by mouth every morning.   Yes [provider]  nepafenac (NEVANAC) 0.1  % ophthalmic suspension Place 1 drop into both eyes 3 (three) times daily as needed.   Yes [provider]  nitroGLYCERIN (NITROSTAT) 0.4 MG SL tablet Place 1 tablet (0.4 mg total) under the tongue every 5 (five) minutes x 3 doses as needed for chest pain. 10/02/16  Yes End, [name], MD  ranitidine (ZANTAC) 300 MG tablet Take 1 tablet (300 mg total) by mouth daily. 07/22/17  Yes Karamalegos, [name], DO  Spacer/Aero-Holding Chambers (OPTICHAMBER ADVANTAGE) MISC 1 each by Other route once. 11/04/14  Yes Ahmed Prima, MD  tamsulosin (FLOMAX) 0.4 MG CAPS capsule Take 0.4 mg by mouth.   Yes [provider]  torsemide (DEMADEX) 20 MG tablet Take 20 mg by mouth daily.   Yes [provider]  warfarin (COUMADIN) 2 MG tablet Take 3 mg by mouth at bedtime.    Yes [provider]  aspirin EC 81 MG tablet Take 1 tablet (81 mg total) by mouth daily. 09/02/17   Theora Gianotti, NP    Review of Systems    Feeling much better overall.  He denies chest pain, palpitations, dyspnea, pnd, orthopnea, n, v, dizziness, syncope, edema, weight gain, or early satiety.  All other systems reviewed and are otherwise negative except as noted above.  Physical Exam    VS:  BP 110/60 (BP Location: Left Arm, Patient Position: Sitting, Cuff Size: Normal)   Pulse 75   Ht 5\' 6"  (1.676 m)   Wt 197 lb (89.4 kg)   BMI 31.80 kg/m  , BMI Body mass index is 31.8 kg/m. GEN: Well nourished, well developed, in no acute distress.  HEENT: normal.  Neck: Supple, no JVD, carotid bruits, or masses. Cardiac: RRR, no murmurs, rubs, or gallops. No clubbing, cyanosis, edema.  Radials/DP/PT 2+ and equal bilaterally.  Respiratory:  Respirations regular and unlabored, clear to auscultation bilaterally. GI: Soft, nontender, nondistended, BS + x 4. MS: no deformity or atrophy. Skin: warm and dry, no rash. Neuro:  Strength and sensation are intact. Psych: Normal affect.  Accessory Clinical  Findings    ECG - regular sinus rhythm, 75, left axis deviation, left anterior fascicular block, right bundle branch block, no acute ST or T changes.  PR interval 176 ms.  Assessment & Plan    1.  HFrEF/mixed ischemic and nonischemic cardiomyopathy: Patient is doing exceptionally well over the past 2 months and has not been having  any significant dyspnea or edema.  His weight is actually down 11 pounds since his last visit here in April.  [name] euvolemic on exam.  I will check a basic metabolic panel today given significant weight loss and ongoing diuretic therapy.  [name] currently on torsemide 20 mg daily along with ACE inhibitor and beta-blocker therapy.  Heart rate and blood pressure well controlled.  We discussed the importance of daily weights, sodium restriction, medication compliance, and symptom reporting and he verbalizes understanding.   2.  Coronary artery disease: Status post prior RCA stenting with known left main disease.  He has not been having any chest pain and overall has been doing well.  [name] currently on Brilinta 90 mg twice daily.  As he has now completed 1 year of Brilinta since his intervention, when he runs out of the tablets he has currently, which he expects to run out of in the next week or so, he will discontinue Brilinta and start aspirin 81 mg daily.  He remains on statin, beta-blocker, ACE inhibitor, and nitrate therapy.  3.  Essential hypertension: Stable.  4.  Hyperlipidemia: LDL was 54 in October 2018.  Continue statin therapy.  5.  Paroxysmal atrial fibrillation: [name] in sinus rhythm.  Continue beta-blocker and Coumadin.  INR is followed in our Coumadin clinic.  6.  Stage III-IV chronic kidney disease: Followed by nephrology.  Following up basic metabolic panel today in the setting of weight loss and ongoing oral diuretic therapy.  He remains on ACE inhibitor.  7.  Type 2 diabetes mellitus: A1c was 9.9 in May.  This is being managed by primary care.  8.  Iron  deficiency anemia: Stable H&H in March.  He remains on iron supplementation.  9.  Bifascicular block: Patient with right bundle branch block and left anterior fascicular block.  PR interval today is normal.  If this lengthens, he will require EP referral for conduction disease.   10.  Disposition: Follow-up basic metabolic panel today.  Follow-up in clinic in 3 months or sooner if necessary.   [name], NP 09/02/2017, 4:47 PM

## 2017-09-02 NOTE — Patient Instructions (Signed)
Medication Instructions:  Your physician has recommended you make the following change in your medication:  1- STOP Brilinta. 2- START Aspirin 81 mg by mouth once a day.   Labwork: Your physician recommends that you return for lab work in: TODAY (CBC, BMET).   Testing/Procedures: none  Follow-Up: Your physician recommends that you schedule a follow-up appointment in: [place].  If you need a refill on your cardiac medications before your next appointment, please call your pharmacy.

## 2017-09-03 ENCOUNTER — Telehealth: Payer: Self-pay | Admitting: Internal Medicine

## 2017-09-03 LAB — BASIC METABOLIC PANEL
BUN/Creatinine Ratio: 16 (ref 10–24)
BUN: 31 mg/dL — AB (ref 8–27)
CALCIUM: 9.3 mg/dL (ref 8.6–10.2)
CO2: 23 mmol/L (ref 20–29)
Chloride: 94 mmol/L — ABNORMAL LOW (ref 96–106)
Creatinine, Ser: 1.98 mg/dL — ABNORMAL HIGH (ref 0.76–1.27)
GFR, EST AFRICAN AMERICAN: 36 mL/min/{1.73_m2} — AB (ref >59)
GFR, EST NON AFRICAN AMERICAN: 31 mL/min/{1.73_m2} — AB (ref >59)
Glucose: 282 mg/dL — ABNORMAL HIGH (ref 65–99)
Potassium: 4.6 mmol/L (ref 3.5–5.2)
Sodium: 132 mmol/L — ABNORMAL LOW (ref 134–144)

## 2017-09-03 LAB — MAGNESIUM: Magnesium: 2 mg/dL (ref 1.6–2.3)

## 2017-09-03 NOTE — Telephone Encounter (Signed)
Patient wife has questions about lab results

## 2017-09-03 NOTE — Telephone Encounter (Signed)
I called and spoke with Mrs. Jindra (ok per [facility]) regarding the patient's lab results.

## 2017-09-08 DIAGNOSIS — E119 Type 2 diabetes mellitus without complications: Secondary | ICD-10-CM | POA: Diagnosis not present

## 2017-09-08 DIAGNOSIS — H1859 Other hereditary corneal dystrophies: Secondary | ICD-10-CM | POA: Diagnosis not present

## 2017-09-08 DIAGNOSIS — H35342 Macular cyst, hole, or pseudohole, left eye: Secondary | ICD-10-CM | POA: Diagnosis not present

## 2017-09-08 DIAGNOSIS — H35372 Puckering of macula, left eye: Secondary | ICD-10-CM | POA: Diagnosis not present

## 2017-09-08 DIAGNOSIS — H26492 Other secondary cataract, left eye: Secondary | ICD-10-CM | POA: Diagnosis not present

## 2017-09-08 LAB — HM DIABETES EYE EXAM

## 2017-09-11 ENCOUNTER — Other Ambulatory Visit: Payer: Self-pay

## 2017-09-11 DIAGNOSIS — E782 Mixed hyperlipidemia: Secondary | ICD-10-CM

## 2017-09-11 MED ORDER — FENOFIBRATE 160 MG PO TABS: 160.0000 mg | ORAL_TABLET | Freq: Every day | ORAL | 3 refills | Status: DC

## 2017-09-11 MED ORDER — ATORVASTATIN CALCIUM 80 MG PO TABS: 80.0000 mg | ORAL_TABLET | Freq: Every day | ORAL | 3 refills | Status: DC

## 2017-09-12 ENCOUNTER — Ambulatory Visit: Payer: Medicare HMO | Admitting: Family Medicine

## 2017-09-15 ENCOUNTER — Telehealth: Payer: Self-pay | Admitting: Family Medicine

## 2017-09-15 ENCOUNTER — Other Ambulatory Visit: Payer: Self-pay

## 2017-09-15 NOTE — Telephone Encounter (Signed)
Mandy,  Can you help take care of this?  Thanks.  Gerald Stabs

## 2017-09-15 NOTE — Telephone Encounter (Signed)
Patient is on chronic anticoagulation with Warfarin for Atrial Fibrillation, he is followed by [facility], by Dr End and has INR checks through them by Dede Query RN.  Could you call Cardiology to request that they refill his Warfarin to make sure instructions are updated based on their INR checks?  I have received a refill request for this and I would prefer if it was prescribed by them to keep it organized.  Thank you.  Will forward message to Cardiology as well.  Lucas Caldwell, [facility] 09/15/2017, 5:36 PM

## 2017-09-17 ENCOUNTER — Other Ambulatory Visit: Payer: Self-pay

## 2017-09-17 MED ORDER — WARFARIN SODIUM 2 MG PO TABS: 3.0000 mg | ORAL_TABLET | Freq: Every day | ORAL | 1 refills | Status: DC

## 2017-09-17 NOTE — Telephone Encounter (Signed)
I don't typically send in 90 day supplies of coumadin, but Lucas Caldwell is consistent w/ his INR checks, so I sent his refills in to [facility].   Please let me know if you need anything else.  Thank you!

## 2017-09-19 MED ORDER — EASY TOUCH 31 GAUGE X 5/16" NEEDLE (8 MM)
1 refills | 0 days | Status: CP
Start: 2017-09-19 — End: 2018-05-27

## 2017-09-23 ENCOUNTER — Telehealth: Payer: Self-pay | Admitting: Family Medicine

## 2017-09-23 NOTE — Telephone Encounter (Signed)
Patient advised that has couple of refill left and Warfarin was refill by cardiologist. They just need to contact the pharmacy.

## 2017-09-23 NOTE — Telephone Encounter (Signed)
Pt needs refills on metoprolol, gabapentin, warfarin and fenofibrate sent to [facility]

## 2017-09-24 ENCOUNTER — Ambulatory Visit (INDEPENDENT_AMBULATORY_CARE_PROVIDER_SITE_OTHER): Payer: Medicare HMO | Admitting: Pharmacist

## 2017-09-24 ENCOUNTER — Telehealth: Payer: Self-pay | Admitting: Family Medicine

## 2017-09-24 ENCOUNTER — Other Ambulatory Visit: Payer: Self-pay | Admitting: Family Medicine

## 2017-09-24 DIAGNOSIS — E782 Mixed hyperlipidemia: Secondary | ICD-10-CM

## 2017-09-24 DIAGNOSIS — I480 Paroxysmal atrial fibrillation: Secondary | ICD-10-CM

## 2017-09-24 DIAGNOSIS — Z7901 Long term (current) use of anticoagulants: Secondary | ICD-10-CM | POA: Diagnosis not present

## 2017-09-24 LAB — POCT INR: INR: 4.1 — AB (ref 2.0–3.0)

## 2017-09-24 MED ORDER — ATORVASTATIN CALCIUM 80 MG PO TABS: 80.0000 mg | ORAL_TABLET | Freq: Every day | ORAL | 1 refills | Status: DC

## 2017-09-24 MED ORDER — WARFARIN SODIUM 2 MG PO TABS: 3.0000 mg | ORAL_TABLET | Freq: Every day | ORAL | 2 refills | Status: DC

## 2017-09-24 MED ORDER — FENOFIBRATE 160 MG PO TABS: 160.0000 mg | ORAL_TABLET | Freq: Every day | ORAL | 1 refills | Status: DC

## 2017-09-24 NOTE — Patient Instructions (Signed)
Description   No warfarin today then take only 1 tablet tomorrow then Please continue taking 1.5 tablets daily.  Recheck INR in 4 weeks.

## 2017-09-24 NOTE — Telephone Encounter (Signed)
Pt needs refills on atorvastatin, fenofibrate and warfarin sent to [facility]

## 2017-09-24 NOTE — Telephone Encounter (Signed)
Rx send and had explained yesterday warfarin was send by cardiologist and spoke to patient's spouse today as well. She understood very well.

## 2017-09-25 ENCOUNTER — Ambulatory Visit
Admit: 2017-09-25 | Discharge: 2017-09-26 | Payer: MEDICARE | Attending: Hematology & Oncology | Primary: Hematology & Oncology

## 2017-09-25 ENCOUNTER — Other Ambulatory Visit: Admit: 2017-09-25 | Discharge: 2017-09-26 | Payer: MEDICARE

## 2017-09-25 DIAGNOSIS — C9210 Chronic myeloid leukemia, BCR/ABL-positive, not having achieved remission: Principal | ICD-10-CM

## 2017-09-25 DIAGNOSIS — E039 Hypothyroidism, unspecified: Secondary | ICD-10-CM | POA: Diagnosis not present

## 2017-09-25 DIAGNOSIS — E119 Type 2 diabetes mellitus without complications: Secondary | ICD-10-CM | POA: Diagnosis not present

## 2017-09-25 DIAGNOSIS — I509 Heart failure, unspecified: Secondary | ICD-10-CM | POA: Diagnosis not present

## 2017-09-25 DIAGNOSIS — I110 Hypertensive heart disease with heart failure: Secondary | ICD-10-CM | POA: Diagnosis not present

## 2017-09-25 DIAGNOSIS — E7800 Pure hypercholesterolemia, unspecified: Secondary | ICD-10-CM | POA: Diagnosis not present

## 2017-09-25 DIAGNOSIS — Z9221 Personal history of antineoplastic chemotherapy: Secondary | ICD-10-CM | POA: Diagnosis not present

## 2017-09-25 DIAGNOSIS — I2510 Atherosclerotic heart disease of native coronary artery without angina pectoris: Secondary | ICD-10-CM | POA: Diagnosis not present

## 2017-09-25 DIAGNOSIS — J45909 Unspecified asthma, uncomplicated: Secondary | ICD-10-CM | POA: Diagnosis not present

## 2017-10-09 ENCOUNTER — Ambulatory Visit (INDEPENDENT_AMBULATORY_CARE_PROVIDER_SITE_OTHER): Payer: Medicare HMO | Admitting: Family Medicine

## 2017-10-09 ENCOUNTER — Ambulatory Visit
Admission: RE | Admit: 2017-10-09 | Discharge: 2017-10-09 | Disposition: A | Discharge status: Home / Self Care | Payer: Medicare HMO | Source: Ambulatory Visit | Attending: Family Medicine | Admitting: Family Medicine

## 2017-10-09 ENCOUNTER — Encounter: Payer: Self-pay | Admitting: Family Medicine

## 2017-10-09 VITALS — BP 123/62 | HR 72 | Temp 98.7°F | Resp 16 | Ht 66.0 in | Wt 199.8 lb

## 2017-10-09 DIAGNOSIS — M7552 Bursitis of left shoulder: Secondary | ICD-10-CM | POA: Insufficient documentation

## 2017-10-09 DIAGNOSIS — N183 Chronic kidney disease, stage 3 unspecified: Secondary | ICD-10-CM

## 2017-10-09 DIAGNOSIS — M25512 Pain in left shoulder: Secondary | ICD-10-CM

## 2017-10-09 DIAGNOSIS — S4992XA Unspecified injury of left shoulder and upper arm, initial encounter: Secondary | ICD-10-CM | POA: Diagnosis not present

## 2017-10-09 MED ORDER — METHYLPREDNISOLONE ACETATE 40 MG/ML IJ SUSP
40.0000 mg | Freq: Once | INTRAMUSCULAR | Status: AC
  Administered 2017-10-09: 40 mg via INTRA_ARTICULAR

## 2017-10-09 MED ORDER — LIDOCAINE HCL (PF) 1 % IJ SOLN
4.0000 mL | Freq: Once | INTRAMUSCULAR | Status: AC
  Administered 2017-10-09: 4 mL

## 2017-10-09 NOTE — Progress Notes (Signed)
Subjective:    Patient ID: Lucas Caldwell, male    DOB: 04-Jul-1933, 84 y.o.   MRN: [id number]  Lucas Caldwell is a 84 y.o. male presenting on 10/09/2017 for Shoulder Pain (left side as per patient fell 3 weeks ago and hurt his shoulder painful to extend his shoulder.)  Patient presents for a same day appointment.  HPI   LEFT SHOULDER PAIN, Reduced range of motion Reports onset about 2-3 weeks ago with fall outside injured his Left shoulder, he states he was outside doing some lifting and his foot got stuck and he twisted and fell down on his Left side shoulder, some pain and soreness, took some Tylenol Ext Str 500mg  x 2 pills up to 3 times a day with some temporary relief overall was effective. - No prior know history of osteoarthritis of shoulder or prior injury, never had rotator cuff injury or surgery - Denies weakness or numbness or tingling in arm or hand, swelling redness fever  Additionally - CKD-III, he had apt with Dr Candiss Norse but he had to re-schedule or cancel. He needs new referral.   Depression screen [facility] 2/9 10/09/2017 08/27/2017 07/22/2017  Decreased Interest 0 0 0  Down, Depressed, Hopeless 0 0 0  PHQ - 2 Score 0 0 0    Social History   Tobacco Use  . Smoking status: Former Smoker    Packs/day: 2.00    Years: 15.00    Pack years: 30.00    Types: Cigarettes  . Smokeless tobacco: Former Systems developer  . Tobacco comment: quit 1979  Substance Use Topics  . Alcohol use: No    Comment: previously drank heavily - quit 1979.  . Drug use: No    Review of Systems Per HPI unless specifically indicated above     Objective:    BP 123/62   Pulse 72   Temp 98.7 F (37.1 C) (Oral)   Resp 16   Ht 5\' 6"  (1.676 m)   Wt 199 lb 12.8 oz (90.6 kg)   BMI 32.25 kg/m   Wt Readings from Last 3 Encounters:  10/09/17 199 lb 12.8 oz (90.6 kg)  09/02/17 197 lb (89.4 kg)  08/27/17 195 lb (88.5 kg)    Physical Exam  Constitutional: [name] oriented to person, place, and time. He  appears well-developed and well-nourished. No distress.  Well-appearing, comfortable, cooperative  HENT:  Head: Normocephalic and atraumatic.  Mouth/Throat: Oropharynx is clear and moist.  Eyes: Conjunctivae are normal. Right eye exhibits no discharge. Left eye exhibits no discharge.  Cardiovascular: Normal rate.  Pulmonary/Chest: Effort normal.  Musculoskeletal: He exhibits no edema.  LEFT Shoulder Inspection: Normal appearance bilateral symmetrical Palpation: Mild tender to palpation over superior and posterior shoulder  ROM: Dramatically reduced range of motion unable to lift without assistance of other arm beyond 45% forward flex and abduction, mostly preserved internal rotation. Special Testing: Rotator cuff testing negative for weakness but poor cooperation due to pain. Hawkin's AC impingement positive for pain Strength: Normal strength 5/5 flex/ext, ext rot / int rot, grip, rotator cuff str testing. Neurovascular: Distally intact pulses, sensation to light touch  Neurological: [name] alert and oriented to person, place, and time.  Skin: Skin is warm and dry. No rash noted. [name] not diaphoretic. No erythema.  Psychiatric: He has a normal mood and affect. His behavior is normal.  Well groomed, good eye contact, normal speech and thoughts  Nursing note and vitals reviewed.    I have personally reviewed  the radiology report from 10/09/17 STAT L shoulder X-ray.  Narrative    CLINICAL DATA: Left shoulder pain since a fall approximately 3 weeks ago. Initial encounter.  EXAM: LEFT SHOULDER - 2+ VIEW  COMPARISON: None.  FINDINGS: There is no evidence of fracture or dislocation. There is no evidence of arthropathy or other focal bone abnormality. Soft tissues are unremarkable.  IMPRESSION: Negative exam.   Electronically Signed By: Inge Rise M.D. On: 10/09/2017 12:20      ________________________________________________________ PROCEDURE NOTE Date:  10/09/17 Left Shoulder subacromial injection Discussed benefits and risks (including pain, bleeding, infection, steroid flare). Verbal consent given by patient. Medication:  1 cc Depo-medrol 40mg  and 4 cc Lidocaine 1% without epi Time Out taken  Landmarks identified. Area cleansed with alcohol wipes.Using 21 gauge and 1, 1/2 inch needle, Left subacromial bursa space was injected (with above listed medication) via posterior approach cold spray used for superficial anesthetic.Sterile bandage placed.Patient tolerated procedure well without bleeding or paresthesias.No complications.   Results for orders placed or performed in visit on 09/24/17  POCT INR  Result Value Ref Range   INR 4.1 (A) 2.0 - 3.0      Assessment & Plan:   Problem List Items Addressed This Visit    CKD (chronic kidney disease), stage III [facility]) Referral sent to [facility] - Patient should have already established with Dr Candiss Norse - unsure why this has not bee arranged, patient had to cancel previous apt - will re-submit referral  Cannot use NSAID with CKD    Relevant Orders   Ambulatory referral to Nephrology    Other Visit Diagnoses    Acute pain of left shoulder    -  Primary   Relevant Medications   lidocaine (PF) (XYLOCAINE) 1 % injection 4 mL (Completed)   methylPREDNISolone acetate (DEPO-MEDROL) injection 40 mg (Completed)   Other Relevant Orders   DG Shoulder Left (Completed)   Acute bursitis of left shoulder       Relevant Medications   lidocaine (PF) (XYLOCAINE) 1 % injection 4 mL (Completed)   methylPREDNISolone acetate (DEPO-MEDROL) injection 40 mg (Completed)   Other Relevant Orders   DG Shoulder Left (Completed)      Consistent with acute L-shoulder bursitis vs rotator cuff tendinopathy with significant reduced active ROM but without significant evidence of muscle tear (no weakness). Recent fall injury on L shoulder seems trigger, no prior injury 84 yr old patient likely underlying  arthritis, not previously documented No imaging on chart  Plan: Left shoulder subacromial steroid injection performed today, see procedure note for details. Notable improvement in ROM after injection already  X-ray today - reviewed result - no acute fracture dislocation or injury, no evidence arthropathy  1. May take Tylenol Ex Str 1-2 q 6 hr PRN 2. Relative rest but keep shoulder mobile, demonstrated ROM exercises, avoid heavy lifting 3. May try heating pad PRN 4. Follow-up 4-6 weeks if not improved for re-evaluation, consider referral to Physical Therapy   Meds ordered this encounter  Medications  . lidocaine (PF) (XYLOCAINE) 1 % injection 4 mL  . methylPREDNISolone acetate (DEPO-MEDROL) injection 40 mg    Follow up plan: Return in about 1 month (around 11/06/2017), or if symptoms worsen or fail to improve, for left shoulder pain.  [name], [facility] 10/09/2017, 4:46 PM

## 2017-10-09 NOTE — Patient Instructions (Addendum)
Thank you for coming to the office today.  You received a Left Shoulder Joint steroid injection today. - Lidocaine numbing medicine may ease the pain initially for a few hours until it wears off - As discussed, you may experience a "steroid flare" this evening or within 24-48 hours, anytime medicine is injected into an inflamed joint it can cause the pain to get worse temporarily - Everyone responds differently to these injections, it depends on the patient and the severity of the joint problem, it may provide anywhere from days to weeks, to months of relief. Ideal response is >6 months relief - Try to take it easy for next 1-2 days, avoid over activity and strain on joint (limit for shoulder) - Recommend the following:   - For swelling - rest, compression sleeve / ACE wrap, elevation, and ice packs as needed for first few days   - For pain in future may use heating pad or moist heat as needed  X-ray today will call with results  If not improving as expected over next several weeks, please follow-up sooner for re-evaluation.   Referral again to see Kidney doctor -  Please call them to re-schedule  Murlean Iba, MD [facility]) [address] Professional [address] [place], [place] [address] Phone: [phone]    Please schedule a Follow-up Appointment to: Return in about 1 month (around 11/06/2017), or if symptoms worsen or fail to improve, for left shoulder pain.  If you have any other questions or concerns, please feel free to call the office or send a message through [place]. You may also schedule an earlier appointment if necessary.  Additionally, you may be receiving a survey about your experience at our office within a few days to 1 week by e-mail or mail. We value your feedback.  [name], DO [facility], [facility]  Range of Motion Shoulder Exercises  [place] with your good arm against a counter or table for  support [facility] forward with a wide stance (make sure your body is comfortable) - Your painful shoulder should hang down and feel "heavy" - Gently move your painful arm in small circles "clockwise" for several turns - Switch to "counterclockwise" for several turns - Early on keep circles narrow and move slowly - Later in rehab, move in larger circles and faster movement   Wall Crawl - Stand close (about 1-2 ft away) to a wall, facing it directly - Reach out with your arm of painful shoulder and place fingers (not palm) on wall - You should make contact with wall at your waist level - Slowly walk your fingers up the wall. Stay in contact with wall entire time, do not remove fingers - Keep walking fingers up wall until you reach shoulder level - You may feel tightening or mild discomfort, once you reach a height that causes pain or if you are already above your shoulder height then stop. Repeat from starting position. - Early on stand closer to wall, move fingers slowly, and stay at or below shoulder level - Later in rehab, stand farther away from wall (fingertips), move fingers quicker, go above shoulder level

## 2017-10-21 ENCOUNTER — Telehealth: Payer: Self-pay | Admitting: Internal Medicine

## 2017-10-21 NOTE — Telephone Encounter (Signed)
S/w patient's wife and she verbalized understanding of Dr Darnelle Bos recommendations and she will call hematology and/or PCP first. She was very appreciative.

## 2017-10-21 NOTE — Telephone Encounter (Signed)
Patient spouse calling    Pt c/o medication issue:  1. Name of Medication:  Ferrous Sulfate   2. How are you currently taking this medication (dosage and times per day)? She just knows he's taking it 3x a day   3. Are you having a reaction (difficulty breathing--STAT)? No   4. What is your medication issue? Noticing bowls being darker. Also patient is not sure why he is taking this medication. Would like a call back about this. [name] thinking if he can stop taking it

## 2017-10-21 NOTE — Telephone Encounter (Signed)
S/w patient's wife, ok per DPR. She and patient are wondering if he still needs to take the ferrous sulfate.  He has been taking it 3 times a day since 12/2016 when he was anemic. Patient says his bowel movements are dark and have been that way since he started taking the iron.  He denies bright red blood from bowels, nausea, vomiting, chest pain or dizziness.  Patient's last CBC is in Care [facility] on 09/25/17 - Hgb 11.5, HCT 35.3. Advised I will make Dr End aware for further advice on taking ferrous sulfate. She was appreciative.

## 2017-10-21 NOTE — Telephone Encounter (Signed)
I am okay with stopping ferrous sulfate at this time.  However, [name] may want to check with his PCP and/or hematologist first.  Lucas Bush, MD [facility] Pager: [phone]

## 2017-10-22 ENCOUNTER — Ambulatory Visit (INDEPENDENT_AMBULATORY_CARE_PROVIDER_SITE_OTHER): Payer: Medicare HMO

## 2017-10-22 DIAGNOSIS — Z7901 Long term (current) use of anticoagulants: Secondary | ICD-10-CM | POA: Diagnosis not present

## 2017-10-22 DIAGNOSIS — I480 Paroxysmal atrial fibrillation: Secondary | ICD-10-CM | POA: Diagnosis not present

## 2017-10-22 LAB — POCT INR: INR: 3.9 — AB (ref 2.0–3.0)

## 2017-10-22 MED ORDER — WARFARIN SODIUM 3 MG PO TABS: 3.0000 mg | ORAL_TABLET | Freq: Every day | ORAL | 0 refills | Status: DC

## 2017-10-22 NOTE — Patient Instructions (Signed)
No warfarin today then START TAKING 1 TABLET EVERY DAY. Recheck INR in 3 weeks.

## 2017-10-24 ENCOUNTER — Encounter: Payer: Self-pay | Admitting: Family Medicine

## 2017-10-24 ENCOUNTER — Ambulatory Visit (INDEPENDENT_AMBULATORY_CARE_PROVIDER_SITE_OTHER): Payer: Medicare HMO | Admitting: Family Medicine

## 2017-10-24 VITALS — BP 117/62 | HR 66 | Temp 98.2°F | Resp 16 | Ht 66.0 in | Wt 198.8 lb

## 2017-10-24 DIAGNOSIS — E1121 Type 2 diabetes mellitus with diabetic nephropathy: Secondary | ICD-10-CM | POA: Diagnosis not present

## 2017-10-24 DIAGNOSIS — IMO0002 Reserved for concepts with insufficient information to code with codable children: Secondary | ICD-10-CM

## 2017-10-24 DIAGNOSIS — E1165 Type 2 diabetes mellitus with hyperglycemia: Secondary | ICD-10-CM | POA: Diagnosis not present

## 2017-10-24 DIAGNOSIS — E1151 Type 2 diabetes mellitus with diabetic peripheral angiopathy without gangrene: Secondary | ICD-10-CM | POA: Diagnosis not present

## 2017-10-24 LAB — POCT GLYCOSYLATED HEMOGLOBIN (HGB A1C): Hemoglobin A1C: 10.3 % — AB (ref 4.0–5.6)

## 2017-10-24 NOTE — Patient Instructions (Addendum)
Thank you for coming to the office today.  A1c is elevated to 10.3, we need to adjust medicines to help control this better.  Limited options due to kidneys.  First - try splitting your dose of Levemir Insulin.  Starting tomorrow Saturday 10/25/17 - you may inject Levemir 30 units with breakfast and then 30 units with dinner.  You can check sugar as discussed - BEFORE breakfast every day and - most days - BEFORE dinner - If you eat early then check TWO HOURS AFTER dinner  If your before BREAKFAST reading is too high >170 consistently - then INCREASE the DINNER dose of Levemir by 2 units after 1 week. Stop at dose 36 if you get to this dose.  If your before DINNER reading is too high >170 consistently - then INCREASE the BREAKFAST dose of Levemir by 2 units after 1 week. Stop at dose 36 if you get to this dose.  We should have some Samples of trulicity 0.75mg  injection ONCE WEEKLY - by next week Thursday. If the sugar is too low < 100 consistently, then we may need to start decreasing Levemir dosing - CALL if questions about low sugar, we would advise reduce Levemir by 2 units every 1 week if fasting sugar is < 150  If we can get these for you, you may use it while in Medicare Donut hole - and then hopefully can afford in January 2020. We can only provide temporary supply  May try to HOLD Ranitidine(Zantac) temporarily to see if you still need it.  Follow-up with GI doctor on 8/21 as discussed - they can help you further with diarrhea symptoms.  Please schedule a Follow-up Appointment to: Return in about 3 months (around 01/24/2018), or if symptoms worsen or fail to improve, for DM A1c.  If you have any other questions or concerns, please feel free to call the office or send a message through [place]. You may also schedule an earlier appointment if necessary.  Additionally, you may be receiving a survey about your experience at our office within a few days to 1 week by e-mail or mail. We  value your feedback.  [name], DO [place]

## 2017-10-24 NOTE — Assessment & Plan Note (Signed)
Uncontrolled DM with A1c now up to 10.3, prior 7-49 Complications - hypoglycemia (improved), CKD-III, peripheral neuropathy, cataracts, CAD  Plan:  1. Discussed DM management concern with CKD and risk of hypoglycemia - limited med options due to CKD - Advised to SPLIT dosing of Levemir from 60 u daily to 30u BID with meals - and given specific titration instructions per AVS for gradual increase on which dose based on CBGs - Also regarding GLP1 - since could not afford before, we will try to obtain samples for him - will get Trulicity 0.75mg  weekly samples for him to start in 1-2 weeks, pending receive from [place] rep for that product - if effective he may continue it and hopefully can purchase in 03/2018 - Advised we may need to [place] in future once he is stable after weeks on Trulicity - may need max dose up to 1.5 eventually - Counseling on risk of hypoglycemia and symptoms 2. Encourage improved lifestyle - keep improving diet - limit carbs, limit sweet tea, inc water - he has poor health literacy on diabetes 3. Check CBG, bring log to next visit for review - did not write down any readings, difficult to adjust his current levemir insulin today 4. Continue anticoag anti platelet, ACEi, Statin  5. Follow-up 3 months for DM A1c, med adjust  If still uncontrolled and limitation improvement GLP will plan to refer him to [facility] for further DM management with insulin titration for patient with CKD

## 2017-10-24 NOTE — Progress Notes (Signed)
Subjective:    Patient ID: Lucas Caldwell, male    DOB: 22-Oct-1933, 84 y.o.   MRN: [id number]  Lucas Caldwell is a 84 y.o. male presenting on 10/24/2017 for Diabetes  History provided by patient, and also by wife, Lucas Caldwell.  HPI   CHRONIC DM, Type 2 / CKD-III Nephropathy and Neuropathy Increased A1c trend from 9s to 10.3 now. Still no hypoglycemia episodes He admits variable or erratic sugars ranging from 130-140s up to 180-190s, sometimes 200 but usually less CBGs: does not have CBG log today Meds: Levemir 60 u daily in AM after breakfast - he was unable to afford Trulicity 5.03 last time rx due to medicare donut hole Reports good compliance. Tolerating well w/o side-effects Currently on ACEi Lifestyle: - Diet (still trying to improve diet, but not always adhering to low carb diet, drinks some water , not enough) - Exercise (limited by dyspnea) - Admits chronicDM Neuropathy,on Gabapentin with improvement - Admits numbness tingling pain in feet at times - Admits improved edema now with compression / long socks - Denies hypoglycemia, nausea vomiting, fever chills, swelling, polyuria  Additional concerns: - Polypharmacy, asking about meds if any that they can discontinue. I advised most are specialized medicines, and limited options to discontinue since they are effective or helping to prevent problems. May try holding Ranitidine 300mg  daily if he does not have GERD or reflux problems. [name] scheduled to to see [facility] in 2 weeks as well and can ask them about this or follow-up on diarrhea as he is asking about this as well.  - Memory loss - he reports an additional concern at end of visit regarding problems with some forgetfulness, he would like a memory test. He has not done AMW and will request him to return for this.  Health Maintenance: Due for Flu vaccine, will return for this.  Depression screen [facility] 2/9 10/24/2017 10/09/2017 08/27/2017  Decreased Interest 0 0 0  Down, Depressed,  Hopeless 0 0 0  PHQ - 2 Score 0 0 0   Past Surgical History:  Procedure Laterality Date  . CATARACT EXTRACTION W/ INTRAOCULAR LENS  IMPLANT, BILATERAL Bilateral   . CORONARY ANGIOPLASTY WITH STENT PLACEMENT  09/2012   2 stents  . CORONARY STENT INTERVENTION N/A 08/19/2016   Procedure: Coronary Stent Intervention;  Surgeon: [name], MD;  Location: [facility];  Service: Cardiovascular;  Laterality: N/A;  . ESOPHAGOGASTRODUODENOSCOPY N/A 12/20/2016   Procedure: ESOPHAGOGASTRODUODENOSCOPY (EGD);  Surgeon: Lin Landsman, MD;  Location: [facility];  Service: Gastroenterology;  Laterality: N/A;  . HERNIA REPAIR     "navel"  . LEFT HEART CATH AND CORONARY ANGIOGRAPHY N/A 08/14/2016   Procedure: Left Heart Cath and Coronary Angiography;  Surgeon: [name], MD;  Location: [facility];  Service: Cardiovascular;  Laterality: N/A;  . TOE AMPUTATION Right    "big toe"    Social History   Tobacco Use  . Smoking status: Former Smoker    Packs/day: 2.00    Years: 15.00    Pack years: 30.00    Types: Cigarettes  . Smokeless tobacco: Former Systems developer  . Tobacco comment: quit 1979  Substance Use Topics  . Alcohol use: No    Comment: previously drank heavily - quit 1979.  . Drug use: No    Review of Systems Per HPI unless specifically indicated above     Objective:    BP 117/62   Pulse 66   Temp 98.2 F (36.8 C) (  Oral)   Resp 16   Ht 5\' 6"  (1.676 m)   Wt 198 lb 12.8 oz (90.2 kg)   BMI 32.09 kg/m   Wt Readings from Last 3 Encounters:  10/24/17 198 lb 12.8 oz (90.2 kg)  10/09/17 199 lb 12.8 oz (90.6 kg)  09/02/17 197 lb (89.4 kg)    Physical Exam  Constitutional: [name] oriented to person, place, and time. He appears well-developed and well-nourished. No distress.  Well appearing 84 year old elderly male, comfortable, cooperative, obese  Eyes: Right eye exhibits no discharge. Left eye exhibits no discharge.  Neck: Normal range of motion. Neck  supple.  Cardiovascular: Normal rate, regular rhythm, normal heart sounds and intact distal pulses.  No murmur heard. Pulmonary/Chest: Effort normal and breath sounds normal. No respiratory distress. He has no wheezes. He has no rales.  Musculoskeletal: Normal range of motion. He exhibits no edema.  Has cane for ambulation assistance  Left shoulder still limited ROM but without pain or other restrictions. Improved.  Neurological: [name] alert and oriented to person, place, and time.  Skin: Skin is warm and dry. No rash noted. [name] not diaphoretic. No erythema.  Psychiatric: He has a normal mood and affect. His behavior is normal.  Well groomed, good eye contact, normal speech and thoughts  Nursing note and vitals reviewed.  Results for orders placed or performed in visit on 10/24/17  POCT HgB A1C  Result Value Ref Range   Hemoglobin A1C 10.3 (A) 4.0 - 5.6 %   Recent Labs    04/22/17 1630 07/22/17 1630 10/24/17 1616  HGBA1C 9.6* 9.9* 10.3*      Assessment & Plan:   Problem List Items Addressed This Visit    Uncontrolled type 2 diabetes mellitus with nephropathy ([place]) - Primary    Uncontrolled DM with A1c now up to 10.3, prior 0-10 Complications - hypoglycemia (improved), CKD-III, peripheral neuropathy, cataracts, CAD  Plan:  1. Discussed DM management concern with CKD and risk of hypoglycemia - limited med options due to CKD - Advised to SPLIT dosing of Levemir from 60 u daily to 30u BID with meals - and given specific titration instructions per AVS for gradual increase on which dose based on CBGs - Also regarding GLP1 - since could not afford before, we will try to obtain samples for him - will get Trulicity 0.75mg  weekly samples for him to start in 1-2 weeks, pending receive from [place] rep for that product - if effective he may continue it and hopefully can purchase in 03/2018 - Advised we may need to [place] in future once he is stable after weeks on Trulicity - may need  max dose up to 1.5 eventually - Counseling on risk of hypoglycemia and symptoms 2. Encourage improved lifestyle - keep improving diet - limit carbs, limit sweet tea, inc water - he has poor health literacy on diabetes 3. Check CBG, bring log to next visit for review - did not write down any readings, difficult to adjust his current levemir insulin today 4. Continue anticoag anti platelet, ACEi, Statin  5. Follow-up 3 months for DM A1c, med adjust  If still uncontrolled and limitation improvement GLP will plan to refer him to [facility] for further DM management with insulin titration for patient with CKD      Relevant Orders   POCT HgB A1C (Completed)      No orders of the defined types were placed in this encounter.  Follow up plan: Return  in about 3 months (around 01/24/2018), or if symptoms worsen or fail to improve, for DM A1c.  [name], [facility] 10/24/2017, 5:04 PM

## 2017-10-28 ENCOUNTER — Telehealth: Payer: Self-pay

## 2017-10-28 ENCOUNTER — Telehealth: Payer: Self-pay | Admitting: Internal Medicine

## 2017-10-28 ENCOUNTER — Other Ambulatory Visit: Payer: Self-pay

## 2017-10-28 NOTE — Telephone Encounter (Signed)
Thank you :)

## 2017-10-28 NOTE — Telephone Encounter (Signed)
Patients wife contacted office to see if we could schedule her husband for his EGD with Dilation.  Her husband is still experiencing swallowing difficulty. Originally we were planning to have EGD done 8 months ago, however Dr. Marisue Humble suggested we wait 6 months-clearance was not granted due to time on blood thinner.  Patient currently on Coumadin-blood thinner request has been faxed electronically to Dr. Marisue Humble for clearance. We have no immediate appts available in office for him to have an office visit- I informed [name] (pts wife) I will go ahead and send blood thinner request and discuss scheduling the EGD with Dilation before office visit.  Please let me know if you are ok with this-blood thinner request has been sent to Dr. Saunders Revel.  Thanks [employer]

## 2017-10-28 NOTE — Telephone Encounter (Signed)
Called and scheduled medicare wellness visit with patient. Next available wasn't until Sept 3rd. Patient requests call if any cancellations/ openings present.   Routing to [employer] for information on appointment.

## 2017-10-28 NOTE — Telephone Encounter (Signed)
° °  [facility] Pre-operative Risk Assessment    Request for surgical clearance:  1. What type of surgery is being performed? EGD with Dilation   2. When is this surgery scheduled? pending   3. What type of clearance is required (medical clearance vs. Pharmacy clearance to hold med vs. Both)? Hold medication  4. Are there any medications that need to be held prior to surgery and how long? coumadin   5. Practice name and name of physician performing surgery? [facility]   6. What is your office phone number [phone]    7.   What is your office fax number [phone]  8.   Anesthesia type (None, local, MAC, general) ?    Ace Gins 10/28/2017, 4:36 PM  _________________________________________________________________   (provider comments below)

## 2017-10-28 NOTE — Telephone Encounter (Signed)
I think it is reasonable for Mr. Demmon to hold warfarin prior to EGD/dilation.  I will have our Coumadin clinic assist with dosing/timing of the medication.  Given multivessel CAD and history of stent just over a year ago, I recommend that he remain on ASA 81 mg daily in the preprocedural period if possible.  Warfarin should be restarted when it is felt safe to do so by Dr. Marius Ditch.  [name], MD [facility] Pager: [phone]

## 2017-10-28 NOTE — Telephone Encounter (Signed)
-----   Message from [name], DO sent at 10/24/2017  5:06 PM EDT ----- Regarding: Memory test Tiffany,  I just saw this patient today on Friday afternoon. This is not urgent. Next week sometime, would you be able to schedule and contact this patient with an appointment for a Annual Medicare Wellness visit here at [facility]?  From what I can tell he has not had one in a while. [name] asking about some memory concerns, seems minor or early symptoms. But it may be beneficial to have a screening test done first before he schedules an actual office visit with me to discuss.  If you could do the AMW along with the routine 6CIT or MMSE whichever you prefer, that would help [place] learn more about his memory.  Thanks!  [name], [facility] 10/24/2017, 5:09 PM

## 2017-10-29 NOTE — Telephone Encounter (Signed)
Holding warfarin for 4-5 days is fine.  As previously noted, he should remain on aspirin 81 mg daily.  [name], MD [facility] Pager: [phone]

## 2017-10-29 NOTE — Telephone Encounter (Signed)
Spoke w/ pt's wife.  Advised her that [facility] is tentatively looking at doing his procedure on 8/22 and that he will need to hold his coumadin x 5 days prior, but remain on aspirin.  She interrupted and asked what I was talking about - they have an appt on 8/21 @ [facility], but has not discussed the timing of the procedure.  Their granddaughter is getting married on 8/23, so she does not think 8/22 will be a good date for him.  Advised her to speak w/ their office and call me back w/ a DOS and I will give them written instructions for holding & resuming coumadin.  She verbalizes understanding and is appreciative of the call.   Routing to [facility] to make them aware.

## 2017-10-29 NOTE — Telephone Encounter (Signed)
Faxed to [facility]. Routing to Coumadin Clinic to assist with management of medication.

## 2017-10-29 NOTE — Telephone Encounter (Signed)
Thanks, I will keep a reminder to reach out if we have any cancellations before 9/3

## 2017-10-29 NOTE — Telephone Encounter (Signed)
Left message for pt to call back  °

## 2017-10-29 NOTE — Telephone Encounter (Signed)
Dr. Saunders Revel, how long would you like for Lucas Caldwell to hold his coumadin prior to procedure? Most surgeons request 4-5 days.  Are you agreeable to this?

## 2017-10-29 NOTE — Telephone Encounter (Signed)
I was contacted by Driscilla Grammes at [facility] with the possible date for the procedure on 11/06/17. Advised I will route to Coumadin Clinic so they may reach out and determine if this date is appropriate regarding management of patient's coumadin.

## 2017-10-29 NOTE — Telephone Encounter (Signed)
Patient returning call.

## 2017-10-29 NOTE — Telephone Encounter (Signed)
Hello Estill Bamberg.  I've spoken to Mrs. Rupe (wife) regarding the EGD for Mr. Amborn.  I've changed the date to 11/03/17 advised his wife to stop the coumadin now to allow for 5 days off coumadin and to remain on the 81 mg aspirin.  She read back the instructions provided verbalized understanding.  I asked her to call me back if she is unclear regarding any of these instructions between now and Monday.  Thank you, Sharyn Lull

## 2017-10-31 ENCOUNTER — Telehealth: Payer: Self-pay

## 2017-10-31 ENCOUNTER — Other Ambulatory Visit: Payer: Self-pay | Admitting: Family Medicine

## 2017-10-31 ENCOUNTER — Encounter: Payer: Self-pay | Admitting: Emergency Medicine

## 2017-10-31 DIAGNOSIS — IMO0002 Reserved for concepts with insufficient information to code with codable children: Secondary | ICD-10-CM

## 2017-10-31 DIAGNOSIS — E1165 Type 2 diabetes mellitus with hyperglycemia: Principal | ICD-10-CM

## 2017-10-31 DIAGNOSIS — E1121 Type 2 diabetes mellitus with diabetic nephropathy: Secondary | ICD-10-CM

## 2017-10-31 MED ORDER — DULAGLUTIDE 0.75 MG/0.5ML ~~LOC~~ SOAJ: 0.7500 mg | SUBCUTANEOUS | 0 refills | Status: DC

## 2017-10-31 NOTE — Telephone Encounter (Signed)
I have pulled 2 trulicity 0.75mg  box samples from our fridge put a label on them and left in front office for pick up today. Each box sample has 2 pens in it, this is 1 month supply. If effective they can notify us for new rx within 1 month.  [name], [facility] 10/31/2017, 12:36 PM

## 2017-10-31 NOTE — Telephone Encounter (Signed)
The pt wife called requesting Trulicity 1.03. I spoke w/ Dr. Parks Ranger and he verbalize that it's okay to give them 2 samples and to notify the patient if it's effective he will send a prescription over.

## 2017-10-31 NOTE — Progress Notes (Signed)
Received Trulicity samples. Patient notified that he may pick up initial start 2 boxes each with 2 pens for Trulicity 0.75mg  weekly inj for 1 month sample supply. If working he can notify us and we can send rx for more at pharmacy.  [name], [facility] 10/31/2017, 12:10 PM

## 2017-11-03 ENCOUNTER — Ambulatory Visit: Payer: Medicare HMO | Admitting: Anesthesiology

## 2017-11-03 ENCOUNTER — Encounter
Admission: RE | Disposition: A | Discharge status: Home / Self Care | Payer: Self-pay | Source: Ambulatory Visit | Attending: Gastroenterology

## 2017-11-03 ENCOUNTER — Ambulatory Visit
Admission: RE | Admit: 2017-11-03 | Discharge: 2017-11-03 | Disposition: A | Discharge status: Home / Self Care | Payer: Medicare HMO | Source: Ambulatory Visit | Attending: Gastroenterology | Admitting: Gastroenterology

## 2017-11-03 DIAGNOSIS — Z79899 Other long term (current) drug therapy: Secondary | ICD-10-CM | POA: Diagnosis not present

## 2017-11-03 DIAGNOSIS — E1122 Type 2 diabetes mellitus with diabetic chronic kidney disease: Secondary | ICD-10-CM | POA: Diagnosis not present

## 2017-11-03 DIAGNOSIS — R9431 Abnormal electrocardiogram [ECG] [EKG]: Secondary | ICD-10-CM | POA: Diagnosis not present

## 2017-11-03 DIAGNOSIS — R1310 Dysphagia, unspecified: Secondary | ICD-10-CM

## 2017-11-03 DIAGNOSIS — D509 Iron deficiency anemia, unspecified: Secondary | ICD-10-CM | POA: Insufficient documentation

## 2017-11-03 DIAGNOSIS — Z794 Long term (current) use of insulin: Secondary | ICD-10-CM | POA: Insufficient documentation

## 2017-11-03 DIAGNOSIS — E785 Hyperlipidemia, unspecified: Secondary | ICD-10-CM | POA: Insufficient documentation

## 2017-11-03 DIAGNOSIS — J449 Chronic obstructive pulmonary disease, unspecified: Secondary | ICD-10-CM | POA: Diagnosis not present

## 2017-11-03 DIAGNOSIS — K219 Gastro-esophageal reflux disease without esophagitis: Secondary | ICD-10-CM | POA: Insufficient documentation

## 2017-11-03 DIAGNOSIS — I5042 Chronic combined systolic (congestive) and diastolic (congestive) heart failure: Secondary | ICD-10-CM | POA: Diagnosis not present

## 2017-11-03 DIAGNOSIS — K449 Diaphragmatic hernia without obstruction or gangrene: Secondary | ICD-10-CM | POA: Insufficient documentation

## 2017-11-03 DIAGNOSIS — I130 Hypertensive heart and chronic kidney disease with heart failure and stage 1 through stage 4 chronic kidney disease, or unspecified chronic kidney disease: Secondary | ICD-10-CM | POA: Insufficient documentation

## 2017-11-03 DIAGNOSIS — Z87891 Personal history of nicotine dependence: Secondary | ICD-10-CM | POA: Insufficient documentation

## 2017-11-03 DIAGNOSIS — I480 Paroxysmal atrial fibrillation: Secondary | ICD-10-CM | POA: Insufficient documentation

## 2017-11-03 DIAGNOSIS — N184 Chronic kidney disease, stage 4 (severe): Secondary | ICD-10-CM | POA: Insufficient documentation

## 2017-11-03 DIAGNOSIS — N400 Enlarged prostate without lower urinary tract symptoms: Secondary | ICD-10-CM | POA: Insufficient documentation

## 2017-11-03 DIAGNOSIS — Z7901 Long term (current) use of anticoagulants: Secondary | ICD-10-CM | POA: Insufficient documentation

## 2017-11-03 DIAGNOSIS — I2510 Atherosclerotic heart disease of native coronary artery without angina pectoris: Secondary | ICD-10-CM | POA: Diagnosis not present

## 2017-11-03 DIAGNOSIS — E039 Hypothyroidism, unspecified: Secondary | ICD-10-CM | POA: Diagnosis not present

## 2017-11-03 DIAGNOSIS — K222 Esophageal obstruction: Secondary | ICD-10-CM | POA: Diagnosis not present

## 2017-11-03 DIAGNOSIS — R1319 Other dysphagia: Secondary | ICD-10-CM

## 2017-11-03 HISTORY — PX: ESOPHAGOGASTRODUODENOSCOPY (EGD) WITH PROPOFOL: SHX5813

## 2017-11-03 LAB — GLUCOSE, CAPILLARY
Glucose-Capillary: 193 mg/dL — ABNORMAL HIGH (ref 70–99)
Glucose-Capillary: 159 mg/dL — ABNORMAL HIGH (ref 70–99)

## 2017-11-03 SURGERY — ESOPHAGOGASTRODUODENOSCOPY (EGD) WITH PROPOFOL
Anesthesia: General

## 2017-11-03 MED ORDER — ONDANSETRON HCL 4 MG/2ML IJ SOLN
INTRAMUSCULAR | Status: DC | PRN
  Administered 2017-11-03: 4 mg via INTRAVENOUS

## 2017-11-03 MED ORDER — OMEPRAZOLE 40 MG PO CPDR: 40.0000 mg | DELAYED_RELEASE_CAPSULE | Freq: Every day | ORAL | 0 refills | Status: DC

## 2017-11-03 MED ORDER — PROPOFOL 10 MG/ML IV BOLUS
INTRAVENOUS | Status: DC | PRN
  Administered 2017-11-03: 140 mg via INTRAVENOUS

## 2017-11-03 MED ORDER — SODIUM CHLORIDE 0.9 % IV SOLN
INTRAVENOUS | Status: DC
  Administered 2017-11-03: 1000 mL via INTRAVENOUS

## 2017-11-03 MED ORDER — FENTANYL CITRATE (PF) 100 MCG/2ML IJ SOLN
INTRAMUSCULAR | Status: DC | PRN
  Administered 2017-11-03: 50 ug via INTRAVENOUS

## 2017-11-03 MED ORDER — LIDOCAINE HCL (CARDIAC) PF 100 MG/5ML IV SOSY
PREFILLED_SYRINGE | INTRAVENOUS | Status: DC | PRN
  Administered 2017-11-03: 100 mg via INTRAVENOUS

## 2017-11-03 MED ORDER — FENTANYL CITRATE (PF) 100 MCG/2ML IJ SOLN
INTRAMUSCULAR | Status: AC
  Filled 2017-11-03: qty 2

## 2017-11-03 MED ORDER — SUCCINYLCHOLINE CHLORIDE 20 MG/ML IJ SOLN
INTRAMUSCULAR | Status: DC | PRN
  Administered 2017-11-03: 100 mg via INTRAVENOUS

## 2017-11-03 MED ORDER — ONDANSETRON HCL 4 MG/2ML IJ SOLN
INTRAMUSCULAR | Status: AC
  Filled 2017-11-03: qty 2

## 2017-11-03 NOTE — Anesthesia Procedure Notes (Addendum)
Procedure Name: Intubation Date/Time: 11/03/2017 11:44 AM Performed by: Hedda Slade, CRNA Pre-anesthesia Checklist: Patient identified, Patient being monitored, Timeout performed, Emergency Drugs available and Suction available Patient Re-evaluated:Patient Re-evaluated prior to induction Oxygen Delivery Method: Circle system utilized Preoxygenation: Pre-oxygenation with 100% oxygen Induction Type: IV induction, Rapid sequence and Cricoid Pressure applied Laryngoscope Size: McGraph and 4 Grade View: Grade I Tube type: Oral Tube size: 7.0 mm Number of attempts: 1 Airway Equipment and Method: Stylet Placement Confirmation: ETT inserted through vocal cords under direct vision,  positive ETCO2 and breath sounds checked- equal and bilateral Secured at: 22 cm Tube secured with: Tape Dental Injury: Teeth and Oropharynx as per pre-operative assessment  Difficulty Due To: Difficulty was anticipated, Difficult Airway- due to large tongue, Difficult Airway- due to anterior larynx, Difficult Airway- due to limited oral opening and Difficult Airway- due to dentition

## 2017-11-03 NOTE — Progress Notes (Signed)
Blood sugar at 12:35    159

## 2017-11-03 NOTE — Transfer of Care (Signed)
Immediate Anesthesia Transfer of Care Note  Patient: Lucas Caldwell  Procedure(s) Performed: ESOPHAGOGASTRODUODENOSCOPY (EGD) WITH PROPOFOL (N/A )  Patient Location: PACU  Anesthesia Type:General  Level of Consciousness: awake  Airway & Oxygen Therapy: Patient Spontanous Breathing  Post-op Assessment: Report given to RN and Post -op Vital signs reviewed and stable  Post vital signs: Reviewed and stable  Last Vitals:  Vitals Value Taken Time  BP 137/78 11/03/2017 12:14 PM  Temp    Pulse 73 11/03/2017 12:17 PM  Resp 17 11/03/2017 12:17 PM  SpO2 100 % 11/03/2017 12:17 PM  Vitals shown include unvalidated device data.  Last Pain:  Vitals:   11/03/17 0953  TempSrc: Tympanic         Complications: No apparent anesthesia complications

## 2017-11-03 NOTE — Anesthesia Postprocedure Evaluation (Signed)
Anesthesia Post Note  Patient: Lucas Caldwell  Procedure(s) Performed: ESOPHAGOGASTRODUODENOSCOPY (EGD) WITH PROPOFOL (N/A )  Patient location during evaluation: PACU Anesthesia Type: General Level of consciousness: awake and alert Pain management: pain level controlled Vital Signs Assessment: post-procedure vital signs reviewed and stable Respiratory status: spontaneous breathing, nonlabored ventilation and respiratory function stable Cardiovascular status: blood pressure returned to baseline and stable Postop Assessment: no apparent nausea or vomiting Anesthetic complications: no Comments: Pt noted to have occasional dropped beats while under general anesthesia with volatile agent, NOT associated with hypotension.  EKG in PACU unchanged from prior.  Pt completely asymptomatic in PACU, denies CP, SOB or discomfort apart from mild sore throat.  Advised to continue regular follow-up with his cardiologist.       Last Vitals:  Vitals:   11/03/17 1237 11/03/17 1244  BP: (!) 148/84   Pulse: 72 76  Resp: 17 17  Temp:    SpO2: 97% 97%    Last Pain:  Vitals:   11/03/17 1244  TempSrc:   PainSc: 0-No pain                 Durenda Hurt

## 2017-11-03 NOTE — Op Note (Signed)
[facility] Gastroenterology Patient Name: Lucas Caldwell Procedure Date: 11/03/2017 11:36 AM MRN: [id number] Account #: [id number] Date of Birth: Apr 29, 1933 Admit Type: Outpatient Age: 84 Room: [facility] ENDO ROOM 3 Gender: Male Note Status: Finalized Procedure:            Upper GI endoscopy Indications:          Dysphagia Providers:            Lin Landsman MD, MD Referring MD:         [name] (Referring MD) Medicines:            General Anesthesia Complications:        No immediate complications. Estimated blood loss: None. Procedure:            Pre-Anesthesia Assessment:                       - Prior to the procedure, a History and Physical was                        performed, and patient medications and allergies were                        reviewed. The patient is competent. The risks and                        benefits of the procedure and the sedation options and                        risks were discussed with the patient. All questions                        were answered and informed consent was obtained.                        Patient identification and proposed procedure were                        verified by the physician, the nurse, the                        anesthesiologist, the anesthetist and the technician in                        the pre-procedure area in the procedure room in the                        endoscopy suite. Mental Status Examination: alert and                        oriented. Airway Examination: normal oropharyngeal                        airway and neck mobility. Respiratory Examination:                        clear to auscultation. CV Examination: normal.                        Prophylactic Antibiotics: The patient does not require  prophylactic antibiotics. Prior Anticoagulants: The                        patient has taken Coumadin (warfarin), last dose was 5   days prior to procedure. ASA Grade Assessment: III - A                        patient with severe systemic disease. After reviewing                        the risks and benefits, the patient was deemed in                        satisfactory condition to undergo the procedure. The                        anesthesia plan was to use monitored anesthesia care                        (MAC). Immediately prior to administration of                        medications, the patient was re-assessed for adequacy                        to receive sedatives. The heart rate, respiratory rate,                        oxygen saturations, blood pressure, adequacy of                        pulmonary ventilation, and response to care were                        monitored throughout the procedure. The physical status                        of the patient was re-assessed after the procedure.                       After obtaining informed consent, the endoscope was                        passed under direct vision. Throughout the procedure,                        the patient's blood pressure, pulse, and oxygen                        saturations were monitored continuously. The Endoscope                        was introduced through the mouth, and advanced to the                        second part of duodenum. The upper GI endoscopy was                        accomplished without difficulty. The patient tolerated  the procedure well. Findings:      The duodenal bulb and second portion of the duodenum were normal.      A 1 cm hiatal hernia was present.      The entire examined stomach was normal.      The cardia and gastric fundus were normal on retroflexion.      Esophagogastric landmarks were identified: the gastroesophageal junction       was found at 35 cm from the incisors.      A non-obstructing Schatzki ring was found at the gastroesophageal       junction. Given h/o dysphagia, A TTS  dilator was passed through the       scope. Dilation with an 18-19-20 mm balloon dilator was performed to 20       mm. The dilation site was examined following endoscope reinsertion and       showed mild improvement in luminal narrowing. Impression:           - Normal duodenal bulb and second portion of the                        duodenum.                       - 1 cm hiatal hernia.                       - Normal stomach.                       - Esophagogastric landmarks identified.                       - Non-obstructing Schatzki ring. Dilated.                       - No specimens collected. Recommendation:       - Discharge patient to home (with spouse).                       - Cardiac diet.                       - Continue present medications.                       - Return to my office as previously scheduled.                       - Resume Coumadin (warfarin) and Brilinta at prior                        doses. Refer to referring physician for further                        adjustment of therapy.                       - Use a proton pump inhibitor PO BID. Procedure Code(s):    --- Professional ---                       [phone], Esophagogastroduodenoscopy, flexible, transoral;  with transendoscopic balloon dilation of esophagus                        (less than 30 mm diameter) Diagnosis Code(s):    --- Professional ---                       K44.9, Diaphragmatic hernia without obstruction or                        gangrene                       K22.2, Esophageal obstruction                       R13.10, Dysphagia, unspecified CPT copyright 2017 American Medical Association. All rights reserved. The codes documented in this report are preliminary and upon coder review may  be revised to meet current compliance requirements. Dr. Ulyess Mort Lin Landsman MD, MD 11/03/2017 12:09:59 PM This report has been signed electronically. Number of Addenda: 0 Note  Initiated On: 11/03/2017 11:36 AM      [facility]

## 2017-11-03 NOTE — Progress Notes (Signed)
EKG has been done for second degree heart block in endo  No changes per doctor

## 2017-11-03 NOTE — Anesthesia Preprocedure Evaluation (Addendum)
Anesthesia Evaluation  Patient identified by MRN, date of birth, ID band Patient awake    Reviewed: Allergy & Precautions, H&P , NPO status , Patient's Chart, lab work & pertinent test results  Airway Mallampati: IV  TM Distance: >3 FB Neck ROM: full   Comment: Difficult airway anticipated due to MP IV, large tongue Dental  (+) Chipped, Missing, Poor Dentition   Pulmonary neg pulmonary ROS, shortness of breath, asthma , COPD, former smoker,    breath sounds clear to auscultation + decreased breath sounds      Cardiovascular Exercise Tolerance: Good hypertension, + angina (stable angina, denies any recently) + CAD (cath 2018 placed DES to RCA, on anticoagulation) and +CHF (EF 45-50%)  negative cardio ROS  + dysrhythmias (RBBB)  Rhythm:regular Rate:Normal     Neuro/Psych  Headaches, negative neurological ROS  negative psych ROS   GI/Hepatic negative GI ROS, Neg liver ROS, GERD (reflux when laying flat, problematic last night)  Poorly Controlled,  Endo/Other  negative endocrine ROSdiabetesHypothyroidism   Renal/GU Renal diseasenegative Renal ROS  negative genitourinary   Musculoskeletal  (+) Arthritis ,   Abdominal   Peds  Hematology negative hematology ROS (+) anemia ,   Anesthesia Other Findings Past Medical History: No date: Arthritis     Comment:  "probably in his legs" (08/15/2016) No date: Asthma 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 No date: CAD (coronary artery disease)     Comment:  a. Cath in the late 90's - reportedly ok;  b. 2014 s/p               stenting x 2 @ [facility]; c 08/19/16 Cath/PCI with DES -> RCA,               plan to treat LM 70% medically. Seen by surgery and felt               to be too high risk for CABG. No date: Chronic combined systolic (congestive) and diastolic   (congestive) heart failure (HCC)     Comment:  a. Previously reduced EF-->50% by echo in 2012;  b.               06/2015 Echo: EF 50-55%  c. 07/2016 Echo: EF 45-50%. No date: Chronic kidney disease (CKD), stage IV (severe) (HCC) No date: CML (chronic myelocytic leukemia) (HCC) No date: GERD (gastroesophageal reflux disease) No date: Gout No date: Hyperlipemia No date: Hypertension No date: Hypothyroidism No date: Iron deficiency anemia No date: Migraine     Comment:  "in the 81s" (08/15/2016) No date: NICM (nonischemic cardiomyopathy) ([place])     Comment:  a. Previously reduced EF-->50% by echo in 2012;  b.               06/2015 Echo: EF 50-55%, Gr2 DD, mild MR, mildly dil LA,               Ao sclerosis, mild TR;  c. 07/2016 Echo: EF 45-50%, ? inf               HK, mild MR, mildly dil LA. No date: PAF (paroxysmal atrial fibrillation) (HCC)     Comment:  a. ? Dx 2014-->s/p DCCV;  b. CHA2DS2VASc = 6-->chronic               coumadin. No date: Prostate cancer ([place]) No date:  RBBB No date: Type II diabetes mellitus ([place])  Past Surgical History: No date: CATARACT EXTRACTION W/ INTRAOCULAR LENS  IMPLANT, BILATERAL;  Bilateral 09/2012: CORONARY ANGIOPLASTY WITH STENT PLACEMENT     Comment:  2 stents 08/19/2016: CORONARY STENT INTERVENTION; N/A     Comment:  Procedure: Coronary Stent Intervention;  Surgeon: [name], MD;  Location: [facility] CV LAB;  Service:              Cardiovascular;  Laterality: N/A; 12/20/2016: ESOPHAGOGASTRODUODENOSCOPY; N/A     Comment:  Procedure: ESOPHAGOGASTRODUODENOSCOPY (EGD);  Surgeon:               Lin Landsman, MD;  Location: [facility] ENDOSCOPY;                Service: Gastroenterology;  Laterality: N/A; No date: HERNIA REPAIR     Comment:  "navel" 08/14/2016: LEFT HEART CATH AND CORONARY ANGIOGRAPHY; N/A     Comment:  Procedure: Left Heart Cath and Coronary Angiography;                Surgeon: [name], MD;  Location: [place]               CV LAB;  Service: Cardiovascular;  Laterality: N/A; No date: TOE AMPUTATION; Right     Comment:  "big toe"  BMI    Body Mass Index:  34.31 kg/m      Reproductive/Obstetrics negative OB ROS                           Anesthesia Physical Anesthesia Plan  ASA: III  Anesthesia Plan: General and General ETT   Post-op Pain Management:    Induction:   PONV Risk Score and Plan:   Airway Management Planned:   Additional Equipment:   Intra-op Plan:   Post-operative Plan:   Informed Consent: I have reviewed the patients History and Physical, chart, labs and discussed the procedure including the risks, benefits and alternatives for the proposed anesthesia with the patient or authorized representative who has indicated his/her understanding and acceptance.   Dental Advisory Given  Plan Discussed with: Anesthesiologist, CRNA and Surgeon  Anesthesia Plan Comments:      Anesthesia Quick Evaluation

## 2017-11-03 NOTE — H&P (Signed)
[name], MD [address]  [place]  [place], [place] [address]  Main: [phone]  Fax: [phone] Pager: [phone]  Primary Care Physician:  [name], DO Primary Gastroenterologist:  Dr. [name]  Pre-Procedure History & Physical: HPI:  Lucas Caldwell is a 84 y.o. male is here for an endoscopy.   Past Medical History:  Diagnosis Date  . Arthritis    "probably in his legs" (08/15/2016)  . Asthma   . Blind right eye   . BPH (benign prostatic hyperplasia)   . Breast asymmetry    Left breast is larger, present for several years.  [name] CAD (coronary artery disease)    a. Cath in the late 90's - reportedly ok;  b. 2014 s/p stenting x 2 @ [facility]; c 08/19/16 Cath/PCI with DES -> RCA, plan to treat LM 70% medically. Seen by surgery and felt to be too high risk for CABG.  . Chronic combined systolic (congestive) and diastolic (congestive) heart failure ([place])    a. Previously reduced EF-->50% by echo in 2012;  b. 06/2015 Echo: EF 50-55%  c. 07/2016 Echo: EF 45-50%.  . Chronic kidney disease (CKD), stage IV (severe) ([place])   . CML (chronic myelocytic leukemia) ([place])   . GERD (gastroesophageal reflux disease)   . Gout   . Hyperlipemia   . Hypertension   . Hypothyroidism   . Iron deficiency anemia   . Migraine    "in the 1960s" (08/15/2016)  . NICM (nonischemic cardiomyopathy) ([place])    a. Previously reduced EF-->50% by echo in 2012;  b. 06/2015 Echo: EF 50-55%, Gr2 DD, mild MR, mildly dil LA, Ao sclerosis, mild TR;  c. 07/2016 Echo: EF 45-50%, ? inf HK, mild MR, mildly dil LA.  [name] PAF (paroxysmal atrial fibrillation) (HCC)    a. ? Dx 2014-->s/p DCCV;  b. CHA2DS2VASc = 6-->chronic coumadin.  . Prostate cancer ([place])   . RBBB   . Type II diabetes mellitus ([place])     Past Surgical History:  Procedure Laterality Date  . CATARACT EXTRACTION W/ INTRAOCULAR LENS  IMPLANT, BILATERAL Bilateral   . CORONARY ANGIOPLASTY WITH STENT PLACEMENT  09/2012   2 stents  .  CORONARY STENT INTERVENTION N/A 08/19/2016   Procedure: Coronary Stent Intervention;  Surgeon: [name], MD;  Location: [facility] CV LAB;  Service: Cardiovascular;  Laterality: N/A;  . ESOPHAGOGASTRODUODENOSCOPY N/A 12/20/2016   Procedure: ESOPHAGOGASTRODUODENOSCOPY (EGD);  Surgeon: Lin Landsman, MD;  Location: [facility] ENDOSCOPY;  Service: Gastroenterology;  Laterality: N/A;  . HERNIA REPAIR     "navel"  . LEFT HEART CATH AND CORONARY ANGIOGRAPHY N/A 08/14/2016   Procedure: Left Heart Cath and Coronary Angiography;  Surgeon: [name], MD;  Location: [facility] CV [facility];  Service: Cardiovascular;  Laterality: N/A;  . TOE AMPUTATION Right    "big toe"    Prior to Admission medications   Medication Sig Start Date End Date Taking? Authorizing Provider  acetaminophen (TYLENOL) 500 MG tablet Take 500 mg by mouth every 6 (six) hours as needed.   Yes [provider]  albuterol (PROVENTIL HFA;VENTOLIN HFA) 108 (90 Base) MCG/ACT inhaler Inhale 2 puffs into the lungs every 6 (six) hours as needed for wheezing or shortness of breath. 05/13/17  Yes Theora Gianotti, NP  Alcohol Swabs 70 % PADS Use when checking blood sugar up to 3x daily 04/09/17  Yes Karamalegos, [name], DO  allopurinol (ZYLOPRIM) 100 MG tablet Take 100 mg by mouth daily. 02/03/17  Yes [provider]  aspirin EC 81 MG tablet Take 1 tablet (81 mg total) by mouth daily. 09/02/17  Yes Theora Gianotti, NP  atorvastatin (LIPITOR) 80 MG tablet Take 1 tablet (80 mg total) by mouth daily. 09/24/17  Yes Karamalegos, Alexander J, DO  atropine 1 % ophthalmic solution Apply to eye.   Yes [provider]  bosutinib (BOSULIF) 100 MG tablet Take 200 mg by mouth at bedtime. Take with food.    Yes [provider]  Dulaglutide (TRULICITY) 4.48 JE/5.6DJ SOPN Inject 0.75 mg into the skin once a week. 10/31/17  Yes Karamalegos, Alexander J, DO  enalapril (VASOTEC) 5 MG tablet Take 1  tablet (5 mg total) by mouth daily. 08/06/17  Yes Karamalegos, [name], DO  fenofibrate 160 MG tablet Take 1 tablet (160 mg total) by mouth daily. 09/24/17  Yes Karamalegos, Alexander J, DO  fluticasone furoate-vilanterol (BREO ELLIPTA) 100-25 MCG/INH AEPB Inhale 1 puff into the lungs daily. 06/13/17  Yes Karamalegos, [name], DO  gabapentin (NEURONTIN) 300 MG capsule Take 1 capsule (300 mg total) by mouth 2 (two) times daily. 06/23/17  Yes Karamalegos, [name], DO  Insulin Detemir (LEVEMIR FLEXTOUCH) 100 UNIT/ML Pen Inject 50 Units into the skin daily. After breakfast 06/19/17  Yes Karamalegos, [name], DO  Insulin Pen Needle 31G X 5 MM MISC 31 g by Does not apply route as directed. 04/16/17  Yes [name], NP  isosorbide mononitrate (IMDUR) 60 MG 24 hr tablet Take 1 tablet (60 mg total) by mouth daily. 03/25/17  Yes End, [name], MD  levothyroxine (SYNTHROID, LEVOTHROID) 50 MCG tablet Take 50 mcg by mouth daily before breakfast.   Yes [provider]  loperamide (IMODIUM A-D) 2 MG tablet Take 2 mg by mouth 3 (three) times daily as needed for diarrhea or loose stools.    Yes [provider]  magnesium oxide (MAG-OX) 400 MG tablet Take 1 tablet (400 mg total) by mouth 2 (two) times daily. 07/16/17  Yes Karamalegos, [name], DO  metoprolol succinate (TOPROL-XL) 50 MG 24 hr tablet Take 1 tablet (50 mg total) by mouth every morning. 06/24/17  Yes Karamalegos, [name], DO  Multiple Vitamins-Minerals (MULTIVITAMIN ADULTS 50+ PO) Take 1 tablet by mouth every morning.   Yes [provider]  nepafenac (NEVANAC) 0.1 % ophthalmic suspension Place 1 drop into both eyes 3 (three) times daily as needed.   Yes [provider]  ranitidine (ZANTAC) 300 MG tablet Take 1 tablet (300 mg total) by mouth daily. 07/22/17  Yes Karamalegos, [name], DO  Spacer/Aero-Holding Chambers (OPTICHAMBER ADVANTAGE) MISC 1 each by Other route once. 11/04/14  Yes Ahmed Prima, MD  tamsulosin (FLOMAX) 0.4 MG CAPS capsule Take 0.4 mg by mouth.   Yes [provider]  torsemide (DEMADEX) 20 MG tablet Take 20 mg by mouth daily.   Yes [provider]  warfarin (COUMADIN) 3 MG tablet Take 1 tablet (3 mg total) by mouth at bedtime. 10/22/17  Yes End, [name], MD  ferrous sulfate 325 (65 FE) MG EC tablet Take 1 tablet (325 mg total) by mouth 3 (three) times daily with meals. Patient not taking: Reported on 11/03/2017 01/08/17   End, [name], MD  nitroGLYCERIN (NITROSTAT) 0.4 MG SL tablet Place 1 tablet (0.4 mg total) under the tongue every 5 (five) minutes x 3 doses as needed for chest pain. Patient not taking: Reported on 11/03/2017 10/02/16   End, [name], MD    Allergies as  of 10/29/2017 - Review Complete 10/24/2017  Allergen Reaction Noted  . Clopidogrel Anaphylaxis 07/06/2012  . Ciprofloxacin Itching 10/14/2012  . Spironolactone  03/29/2014    Family History  Problem Relation Age of Onset  . Brain cancer Father   . Diabetes Sister   . Heart attack Sister     Social History   Socioeconomic History  . Marital status: Married    Spouse name: Not on file  . Number of children: Not on file  . Years of education: Not on file  . Highest education level: Not on file  Occupational History  . 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  Social Needs  . Financial resource strain: Not on file  . Food insecurity:    Worry: Not on file    Inability: Not on file  . Transportation needs:    Medical: Not on file    Non-medical: Not on file  Tobacco Use  . Smoking status: Former Smoker    Packs/day: 2.00    Years: 15.00    Pack years: 30.00    Types: Cigarettes  . Smokeless tobacco: Former Systems developer  . Tobacco comment: quit 1979  Substance and Sexual Activity  . Alcohol use: No    Comment: previously drank heavily - quit 1979.  . Drug use: No  . Sexual activity: Never  Lifestyle  . Physical activity:     Days per week: Not on file    Minutes per session: Not on file  . Stress: Not on file  Relationships  . Social connections:    Talks on phone: Not on file    Gets together: Not on file    Attends religious service: Not on file    Active member of club or organization: Not on file    Attends meetings of clubs or organizations: Not on file    Relationship status: Not on file  . Intimate partner violence:    Fear of current or ex partner: Not on file    Emotionally abused: Not on file    Physically abused: Not on file    Forced sexual activity: Not on file  Other Topics Concern  . Not on file  Social History Narrative  . Not on file    Review of Systems: See HPI, otherwise negative ROS  Physical Exam: BP 130/76   Pulse 80   Temp (!) 96.7 F (35.9 C) (Tympanic)   Resp 16   Ht 5' 4.5" (1.638 m)   Wt 92.1 kg   SpO2 100%   BMI 34.31 kg/m  General:   Alert,  pleasant and cooperative in NAD Head:  Normocephalic and atraumatic. Neck:  Supple; no masses or thyromegaly. Lungs:  Clear throughout to auscultation.    Heart:  Regular rate and rhythm. Abdomen:  Soft, nontender and nondistended. Normal bowel sounds, without guarding, and without rebound.   Neurologic:  Alert and  oriented x4;  grossly normal neurologically.  Impression/Plan: Lucas Caldwell is here for an endoscopy to be performed for dysphagia  Risks, benefits, limitations, and alternatives regarding  endoscopy have been reviewed with the patient.  Questions have been answered.  All parties agreeable.   Sherri Sear, MD  11/03/2017, 10:44 AM

## 2017-11-03 NOTE — Anesthesia Post-op Follow-up Note (Signed)
Anesthesia QCDR form completed.        

## 2017-11-05 ENCOUNTER — Ambulatory Visit: Payer: Medicare HMO | Admitting: Gastroenterology

## 2017-11-05 ENCOUNTER — Encounter: Payer: Self-pay | Admitting: Gastroenterology

## 2017-11-05 ENCOUNTER — Encounter

## 2017-11-12 ENCOUNTER — Telehealth: Payer: Self-pay

## 2017-11-12 NOTE — Telephone Encounter (Signed)
Dr. Marius Ditch patient states that he is still having the same problem being unable to completely swallow- feels like foods just sitting on his stomach.  Please advise.  Thanks [employer]

## 2017-11-18 ENCOUNTER — Ambulatory Visit (INDEPENDENT_AMBULATORY_CARE_PROVIDER_SITE_OTHER): Payer: Medicare HMO

## 2017-11-18 VITALS — BP 136/68 | HR 72 | Temp 98.6°F | Resp 16 | Ht 66.0 in | Wt 197.4 lb

## 2017-11-18 DIAGNOSIS — Z0000 Encounter for general adult medical examination without abnormal findings: Secondary | ICD-10-CM | POA: Diagnosis not present

## 2017-11-18 NOTE — Patient Instructions (Addendum)
Lucas Caldwell , Thank you for taking time to come for your Medicare Wellness Visit. I appreciate your ongoing commitment to your health goals. Please review the following plan we discussed and let me know if I can assist you in the future.   Screening recommendations/referrals: Colonoscopy: no longer required Recommended yearly ophthalmology/optometry visit for glaucoma screening and checkup Recommended yearly dental visit for hygiene and checkup  Vaccinations: Influenza vaccine: due 12/2017 Pneumococcal vaccine: completed series Tdap vaccine: due, check with your insurance company for coverage  Shingles vaccine: shingrix eligible, check with your insurance company for coverage     Advanced directives: Advance directive discussed with you today. I have provided a copy for you to complete at home and have notarized. Once this is complete please bring a copy in to our office so we can scan it into your chart.  Conditions/risks identified: Recommend drinking at least 6-8 glasses of water a day   Next appointment: Follow up on 01/27/2018 at 2:20pm with Dr.Karamalegos. Follow up in one year for your annual wellness exam.   Preventive Care 65 Years and Older, Male Preventive care refers to lifestyle choices and visits with your health care provider that can promote health and wellness. What does preventive care include?  A yearly physical exam. This is also called an annual well check.  Dental exams once or twice a year.  Routine eye exams. Ask your health care provider how often you should have your eyes checked.  Personal lifestyle choices, including:  Daily care of your teeth and gums.  Regular physical activity.  Eating a healthy diet.  Avoiding tobacco and drug use.  Limiting alcohol use.  Practicing safe sex.  Taking low doses of aspirin every day.  Taking vitamin and mineral supplements as recommended by your health care provider. What happens during an annual well  check? The services and screenings done by your health care provider during your annual well check will depend on your age, overall health, lifestyle risk factors, and family history of disease. Counseling  Your health care provider may ask you questions about your:  Alcohol use.  Tobacco use.  Drug use.  Emotional well-being.  Home and relationship well-being.  Sexual activity.  Eating habits.  History of falls.  Memory and ability to understand (cognition).  Work and work Statistician. Screening  You may have the following tests or measurements:  Height, weight, and BMI.  Blood pressure.  Lipid and cholesterol levels. These may be checked every 5 years, or more frequently if you are over 27 years old.  Skin check.  Lung cancer screening. You may have this screening every year starting at age 70 if you have a 30-pack-year history of smoking and currently smoke or have quit within the past 15 years.  Fecal occult blood test (FOBT) of the stool. You may have this test every year starting at age 59.  Flexible sigmoidoscopy or colonoscopy. You may have a sigmoidoscopy every 5 years or a colonoscopy every 10 years starting at age 71.  Prostate cancer screening. Recommendations will vary depending on your family history and other risks.  Hepatitis C blood test.  Hepatitis B blood test.  Sexually transmitted disease (STD) testing.  Diabetes screening. This is done by checking your blood sugar (glucose) after you have not eaten for a while (fasting). You may have this done every 1-3 years.  Abdominal aortic aneurysm (AAA) screening. You may need this if you are a current or former smoker.  Osteoporosis. You may  be screened starting at age 26 if you are at high risk. Talk with your health care provider about your test results, treatment options, and if necessary, the need for more tests. Vaccines  Your health care provider may recommend certain vaccines, such  as:  Influenza vaccine. This is recommended every year.  Tetanus, diphtheria, and acellular pertussis (Tdap, Td) vaccine. You may need a Td booster every 10 years.  Zoster vaccine. You may need this after age 10.  Pneumococcal 13-valent conjugate (PCV13) vaccine. One dose is recommended after age 13.  Pneumococcal polysaccharide (PPSV23) vaccine. One dose is recommended after age 52. Talk to your health care provider about which screenings and vaccines you need and how often you need them. This information is not intended to replace advice given to you by your health care provider. Make sure you discuss any questions you have with your health care provider. Document Released: 03/31/2015 Document Revised: 11/22/2015 Document Reviewed: 01/03/2015 Elsevier Interactive Patient Education  2017 [place] Prevention in the Home Falls can cause injuries. They can happen to people of all ages. There are many things you can do to make your home safe and to help prevent falls. What can I do on the outside of my home?  Regularly fix the edges of walkways and driveways and fix any cracks.  Remove anything that might make you trip as you walk through a door, such as a raised step or threshold.  Trim any bushes or trees on the path to your home.  Use bright outdoor lighting.  Clear any walking paths of anything that might make someone trip, such as rocks or tools.  Regularly check to see if handrails are loose or broken. Make sure that both sides of any steps have handrails.  Any raised decks and porches should have guardrails on the edges.  Have any leaves, snow, or ice cleared regularly.  Use sand or salt on walking paths during winter.  Clean up any spills in your garage right away. This includes oil or grease spills. What can I do in the bathroom?  Use night lights.  Install grab bars by the toilet and in the tub and shower. Do not use towel bars as grab bars.  Use  non-skid mats or decals in the tub or shower.  If you need to sit down in the shower, use a plastic, non-slip stool.  Keep the floor dry. Clean up any water that spills on the floor as soon as it happens.  Remove soap buildup in the tub or shower regularly.  Attach bath mats securely with double-sided non-slip rug tape.  Do not have throw rugs and other things on the floor that can make you trip. What can I do in the bedroom?  Use night lights.  Make sure that you have a light by your bed that is easy to reach.  Do not use any sheets or blankets that are too big for your bed. They should not hang down onto the floor.  Have a firm chair that has side arms. You can use this for support while you get dressed.  Do not have throw rugs and other things on the floor that can make you trip. What can I do in the kitchen?  Clean up any spills right away.  Avoid walking on wet floors.  Keep items that you use a lot in easy-to-reach places.  If you need to reach something above you, use a strong step stool that has  a grab bar.  Keep electrical cords out of the way.  Do not use floor polish or wax that makes floors slippery. If you must use wax, use non-skid floor wax.  Do not have throw rugs and other things on the floor that can make you trip. What can I do with my stairs?  Do not leave any items on the stairs.  Make sure that there are handrails on both sides of the stairs and use them. Fix handrails that are broken or loose. Make sure that handrails are as long as the stairways.  Check any carpeting to make sure that it is firmly attached to the stairs. Fix any carpet that is loose or worn.  Avoid having throw rugs at the top or bottom of the stairs. If you do have throw rugs, attach them to the floor with carpet tape.  Make sure that you have a light switch at the top of the stairs and the bottom of the stairs. If you do not have them, ask someone to add them for you. What  else can I do to help prevent falls?  Wear shoes that:  Do not have high heels.  Have rubber bottoms.  Are comfortable and fit you well.  Are closed at the toe. Do not wear sandals.  If you use a stepladder:  Make sure that it is fully opened. Do not climb a closed stepladder.  Make sure that both sides of the stepladder are locked into place.  Ask someone to hold it for you, if possible.  Clearly mark and make sure that you can see:  Any grab bars or handrails.  First and last steps.  Where the edge of each step is.  Use tools that help you move around (mobility aids) if they are needed. These include:  Canes.  Walkers.  Scooters.  Crutches.  Turn on the lights when you go into a dark area. Replace any light bulbs as soon as they burn out.  Set up your furniture so you have a clear path. Avoid moving your furniture around.  If any of your floors are uneven, fix them.  If there are any pets around you, be aware of where they are.  Review your medicines with your doctor. Some medicines can make you feel dizzy. This can increase your chance of falling. Ask your doctor what other things that you can do to help prevent falls. This information is not intended to replace advice given to you by your health care provider. Make sure you discuss any questions you have with your health care provider. Document Released: 12/29/2008 Document Revised: 08/10/2015 Document Reviewed: 04/08/2014 Elsevier Interactive Patient Education  2017 [employer].

## 2017-11-18 NOTE — Telephone Encounter (Signed)
Spoke with pt and he states he is feeling much better.

## 2017-11-18 NOTE — Progress Notes (Signed)
Subjective:   Lucas Caldwell is a 84 y.o. male who presents for Medicare Annual/Subsequent preventive examination.  Review of Systems:   Cardiac Risk Factors include: hypertension;male gender;advanced age (>68men, >4 women);dyslipidemia;diabetes mellitus;obesity (BMI >30kg/m2)     Objective:    Vitals: BP 136/68 (BP Location: Left Arm, Patient Position: Sitting)   Pulse 72   Temp 98.6 F (37 C) (Oral)   Resp 16   Ht 5\' 6"  (1.676 m)   Wt 197 lb 6.4 oz (89.5 kg)   BMI 31.86 kg/m   Body mass index is 31.86 kg/m.  Advanced Directives 11/18/2017 11/03/2017 12/18/2016 12/18/2016 09/19/2016 09/19/2016 08/15/2016  Does Patient Have a Medical Advance Directive? No Yes No No No No No  Type of Advance Directive - Living will - - - - -  Would patient like information on creating a medical advance directive? Yes (MAU/Ambulatory/Procedural Areas - Information given) - No - Patient declined - No - Patient declined Yes (Inpatient - patient requests chaplain consult to create a medical advance directive) Yes (Inpatient - patient defers creating a medical advance directive at this time)    Tobacco Social History   Tobacco Use  Smoking Status Former Smoker  . Packs/day: 2.00  . Years: 15.00  . Pack years: 30.00  . Types: Cigarettes  Smokeless Tobacco Former Systems developer  Tobacco Comment   quit 1979     Counseling given: Not Answered Comment: quit 1979   Clinical Intake:  Pre-visit preparation completed: Yes  Pain : No/denies pain Pain Score: 0-No pain     Nutritional Status: BMI > 30  Obese Nutritional Risks: None Diabetes: Yes CBG done?: No Did pt. bring in CBG monitor from home?: No  How often do you need to have someone help you when you read instructions, pamphlets, or other written materials from your doctor or pharmacy?: 1 - Never What is the last grade level you completed in school?: 7th grade  Interpreter Needed?: No  Information entered by :: Shanaiya Bene,LPN   Past  Medical History:  Diagnosis Date  . Arthritis    "probably in his legs" (08/15/2016)  . Asthma   . Blind right eye   . BPH (benign prostatic hyperplasia)   . Breast asymmetry    Left breast is larger, present for several years.  [name] CAD (coronary artery disease)    a. Cath in the late 90's - reportedly ok;  b. 2014 s/p stenting x 2 @ [facility]; c 08/19/16 Cath/PCI with DES -> RCA, plan to treat LM 70% medically. Seen by surgery and felt to be too high risk for CABG.  . Chronic combined systolic (congestive) and diastolic (congestive) heart failure ([place])    a. Previously reduced EF-->50% by echo in 2012;  b. 06/2015 Echo: EF 50-55%  c. 07/2016 Echo: EF 45-50%.  . Chronic kidney disease (CKD), stage IV (severe) ([place])   . CML (chronic myelocytic leukemia) ([place])   . GERD (gastroesophageal reflux disease)   . Gout   . Hyperlipemia   . Hypertension   . Hypothyroidism   . Iron deficiency anemia   . Migraine    "in the 1960s" (08/15/2016)  . NICM (nonischemic cardiomyopathy) ([place])    a. Previously reduced EF-->50% by echo in 2012;  b. 06/2015 Echo: EF 50-55%, Gr2 DD, mild MR, mildly dil LA, Ao sclerosis, mild TR;  c. 07/2016 Echo: EF 45-50%, ? inf HK, mild MR, mildly dil LA.  [name] PAF (paroxysmal atrial fibrillation) ([place])    a. ?  Dx 2014-->s/p DCCV;  b. CHA2DS2VASc = 6-->chronic coumadin.  . Prostate cancer ([place])   . RBBB   . Type II diabetes mellitus ([place])    Past Surgical History:  Procedure Laterality Date  . CATARACT EXTRACTION W/ INTRAOCULAR LENS  IMPLANT, BILATERAL Bilateral   . CORONARY ANGIOPLASTY WITH STENT PLACEMENT  09/2012   2 stents  . CORONARY STENT INTERVENTION N/A 08/19/2016   Procedure: Coronary Stent Intervention;  Surgeon: [name], MD;  Location: [facility];  Service: Cardiovascular;  Laterality: N/A;  . ESOPHAGOGASTRODUODENOSCOPY N/A 12/20/2016   Procedure: ESOPHAGOGASTRODUODENOSCOPY (EGD);  Surgeon: Lin Landsman, MD;  Location: [facility] ENDOSCOPY;  Service:  Gastroenterology;  Laterality: N/A;  . ESOPHAGOGASTRODUODENOSCOPY (EGD) WITH PROPOFOL N/A 11/03/2017   Procedure: ESOPHAGOGASTRODUODENOSCOPY (EGD) WITH PROPOFOL;  Surgeon: Lin Landsman, MD;  Location[facility] ENDOSCOPY;  Service: Gastroenterology;  Laterality: N/A;  . HERNIA REPAIR     "navel"  . LEFT HEART CATH AND CORONARY ANGIOGRAPHY N/A 08/14/2016   Procedure: Left Heart Cath and Coronary Angiography;  Surgeon: [name], MD;  Location: [facility];  Service: Cardiovascular;  Laterality: N/A;  . TOE AMPUTATION Right    "big toe"   Family History  Problem Relation Age of Onset  . Brain cancer Father   . Diabetes Sister   . Heart attack Sister    Social History   Socioeconomic History  . Marital status: Married    Spouse name: Not on file  . Number of children: Not on file  . Years of education: Not on file  . Highest education level: 7th grade  Occupational History  . Occupation: Retired    Comment: Owned his own [employer].  Social Needs  . Financial resource strain: Hard  . Food insecurity:    Worry: Sometimes true    Inability: Never true  . Transportation needs:    Medical: No    Non-medical: No  Tobacco Use  . Smoking status: Former Smoker    Packs/day: 2.00    Years: 15.00    Pack years: 30.00    Types: Cigarettes  . Smokeless tobacco: Former Systems developer  . Tobacco comment: quit 1979  Substance and Sexual Activity  . Alcohol use: No    Comment: previously drank heavily - quit 1979.  . Drug use: No  . Sexual activity: Never  Lifestyle  . Physical activity:    Days per week: 0 days    Minutes per session: 0 min  . Stress: Not at all  Relationships  . Social connections:    Talks on phone: More than three times a week    Gets together: More than three times a week    Attends religious service: More than 4 times per year    Active member of club or organization: No    Attends meetings of clubs or organizations: Never    Relationship  status: Married  Other Topics Concern  . Not on file  Social History Narrative   Working on transportation truck part time     Outpatient Encounter Medications as of 11/18/2017  Medication Sig  . acetaminophen (TYLENOL) 500 MG tablet Take 500 mg by mouth every 6 (six) hours as needed.  [name] albuterol (PROVENTIL HFA;VENTOLIN HFA) 108 (90 Base) MCG/ACT inhaler Inhale 2 puffs into the lungs every 6 (six) hours as needed for wheezing or shortness of breath.  . Alcohol Swabs 70 % PADS Use when checking blood sugar up to 3x daily  . allopurinol (ZYLOPRIM) 100  MG tablet Take 100 mg by mouth daily.  [name] aspirin EC 81 MG tablet Take 1 tablet (81 mg total) by mouth daily.  [name] atorvastatin (LIPITOR) 80 MG tablet Take 1 tablet (80 mg total) by mouth daily.  [name] atropine 1 % ophthalmic solution Apply to eye.  . bosutinib (BOSULIF) 100 MG tablet Take 200 mg by mouth at bedtime. Take with food.   . Dulaglutide (TRULICITY) 0.30 SP/2.3RA SOPN Inject 0.75 mg into the skin once a week.  . enalapril (VASOTEC) 5 MG tablet Take 1 tablet (5 mg total) by mouth daily.  . fenofibrate 160 MG tablet Take 1 tablet (160 mg total) by mouth daily.  . fluticasone furoate-vilanterol (BREO ELLIPTA) 100-25 MCG/INH AEPB Inhale 1 puff into the lungs daily.  [name] gabapentin (NEURONTIN) 300 MG capsule Take 1 capsule (300 mg total) by mouth 2 (two) times daily.  . Insulin Detemir (LEVEMIR FLEXTOUCH) 100 UNIT/ML Pen Inject 50 Units into the skin daily. After breakfast  . Insulin Pen Needle 31G X 5 MM MISC 31 g by Does not apply route as directed.  . isosorbide mononitrate (IMDUR) 60 MG 24 hr tablet Take 1 tablet (60 mg total) by mouth daily.  [name] levothyroxine (SYNTHROID, LEVOTHROID) 50 MCG tablet Take 50 mcg by mouth daily before breakfast.  . magnesium oxide (MAG-OX) 400 MG tablet Take 1 tablet (400 mg total) by mouth 2 (two) times daily.  . metoprolol succinate (TOPROL-XL) 50 MG 24 hr tablet Take 1 tablet (50 mg total) by mouth every morning.   . Multiple Vitamins-Minerals (MULTIVITAMIN ADULTS 50+ PO) Take 1 tablet by mouth every morning.  . nepafenac (NEVANAC) 0.1 % ophthalmic suspension Place 1 drop into both eyes 3 (three) times daily as needed.  [name] omeprazole (PRILOSEC) 40 MG capsule Take 1 capsule (40 mg total) by mouth daily before breakfast.  . ranitidine (ZANTAC) 300 MG tablet Take 1 tablet (300 mg total) by mouth daily.  [name] Spacer/Aero-Holding Chambers (OPTICHAMBER ADVANTAGE) MISC 1 each by Other route once.  . torsemide (DEMADEX) 20 MG tablet Take 20 mg by mouth daily.  [name] warfarin (COUMADIN) 3 MG tablet Take 1 tablet (3 mg total) by mouth at bedtime.  . ferrous sulfate 325 (65 FE) MG EC tablet Take 1 tablet (325 mg total) by mouth 3 (three) times daily with meals. (Patient not taking: Reported on 11/03/2017)  . loperamide (IMODIUM A-D) 2 MG tablet Take 2 mg by mouth 3 (three) times daily as needed for diarrhea or loose stools.   . nitroGLYCERIN (NITROSTAT) 0.4 MG SL tablet Place 1 tablet (0.4 mg total) under the tongue every 5 (five) minutes x 3 doses as needed for chest pain. (Patient not taking: Reported on 11/03/2017)  . tamsulosin (FLOMAX) 0.4 MG CAPS capsule Take 0.4 mg by mouth.   No facility-administered encounter medications on file as of 11/18/2017.     Activities of Daily Living In your present state of health, do you have any difficulty performing the following activities: 11/18/2017 12/18/2016  Hearing? N Y  Vision? Y N  Difficulty concentrating or making decisions? N Y  Walking or climbing stairs? N Y  Dressing or bathing? N N  Doing errands, shopping? N N  Preparing Food and eating ? N -  Using the Toilet? N -  In the past six months, have you accidently leaked urine? N -  Do you have problems with loss of bowel control? N -  Managing your Medications? N -  Managing your Finances? N -  Housekeeping or managing your Housekeeping? N -  Some recent data might be hidden    Patient Care Team: [name], DO as PCP - General (Family Medicine) End, [name], MD as PCP - Cardiology (Cardiology)   Assessment:   This is a routine wellness examination for Lucas Caldwell.  Exercise Activities and Dietary recommendations Current Exercise Habits: The patient does not participate in regular exercise at present, Exercise limited by: None identified  Goals    . DIET - INCREASE WATER INTAKE     Recommend drinking at least 6-8 glasses of water a day        Fall Risk Fall Risk  11/18/2017 10/24/2017 10/09/2017 08/27/2017 07/22/2017  Falls in the past year? Yes No No No No  Number falls in past yr: 1 - - - -  Injury with Fall? No - - - -  Follow up Falls evaluation completed;Falls prevention discussed - - - -   Is the patient's home free of loose throw rugs in walkways, pet beds, electrical cords, etc?   yes      Grab bars in the bathroom? yes      Handrails on the stairs?   yes      Adequate lighting?   yes  Timed Get Up and Go Performed: Completed in 9 seconds with use of assistive devices,- cane steady gait. No intervention needed at this time.   Depression Screen PHQ 2/9 Scores 11/18/2017 10/24/2017 10/09/2017 08/27/2017  PHQ - 2 Score 0 0 0 0    Cognitive Function MMSE - Mini Mental State Exam 11/18/2017  Orientation to time 5  Orientation to Place 5  Registration 3  Attention/ Calculation 5  Recall 2  Language- name 2 objects 2  Language- repeat 1  Language- follow 3 step command 3  Language- read & follow direction 1  Write a sentence 1  Copy design 0  Total score 28        Immunization History  Administered Date(s) Administered  . Influenza, High Dose Seasonal PF 12/29/2014, 12/19/2016  . Influenza, Seasonal, Injecte, Preservative Fre 01/13/2007, 12/13/2008, 01/30/2010, 12/26/2011  . Influenza,inj,Quad PF,6+ Mos 12/25/2012, 12/09/2015  . PPD Test 09/25/2010  . Pneumococcal Conjugate-13 01/31/2014  . Pneumococcal Polysaccharide-23 06/20/2008, 09/02/2010    Qualifies for  Shingles Vaccine? Yes, discussed shingrix vaccine   Screening Tests Health Maintenance  Topic Date Due  . INFLUENZA VACCINE  10/16/2017  . TETANUS/TDAP  10/25/2018 (Originally 11/15/1952)  . HEMOGLOBIN A1C  04/26/2018  . FOOT EXAM  06/14/2018  . OPHTHALMOLOGY EXAM  09/09/2018  . PNA vac Low Risk Adult  Completed   Cancer Screenings: Lung: Low Dose CT Chest recommended if Age 43-80 years, 30 pack-year currently smoking OR have quit w/in 15years. Patient does not qualify. Colorectal:  Not indicated   Additional Screenings:  Hepatitis C Screening: not indicated        Plan:    I have personally reviewed and addressed the Medicare Annual Wellness questionnaire and have noted the following in the patient's chart:  A. Medical and social history B. Use of alcohol, tobacco or illicit drugs  C. Current medications and supplements D. Functional ability and status E.  Nutritional status F.  Physical activity G. Advance directives H. List of other physicians I.  Hospitalizations, surgeries, and ER visits in previous 12 months J.  [place] such as hearing and vision if needed, cognitive and depression L. Referrals and appointments   In addition, I have reviewed and discussed with  patient certain preventive protocols, quality metrics, and best practice recommendations. A written personalized care plan for preventive services as well as general preventive health recommendations were provided to patient.   Signed,  Tyler Aas, LPN Nurse Health Advisor   Nurse Notes: needs refill on gabapentin

## 2017-11-19 ENCOUNTER — Ambulatory Visit (INDEPENDENT_AMBULATORY_CARE_PROVIDER_SITE_OTHER): Payer: Medicare HMO

## 2017-11-19 ENCOUNTER — Other Ambulatory Visit: Payer: Self-pay | Admitting: Family Medicine

## 2017-11-19 DIAGNOSIS — E1142 Type 2 diabetes mellitus with diabetic polyneuropathy: Secondary | ICD-10-CM

## 2017-11-19 DIAGNOSIS — I480 Paroxysmal atrial fibrillation: Secondary | ICD-10-CM

## 2017-11-19 DIAGNOSIS — Z7901 Long term (current) use of anticoagulants: Secondary | ICD-10-CM | POA: Diagnosis not present

## 2017-11-19 LAB — POCT INR: INR: 3.5 — AB (ref 2.0–3.0)

## 2017-11-19 NOTE — Addendum Note (Signed)
Addended by: Tyler Aas A on: 11/19/2017 01:35 PM   Modules accepted: Orders

## 2017-11-19 NOTE — Patient Instructions (Signed)
Please skip coumadin today, then resume 1 TABLET EVERY DAY. Recheck INR in 3 weeks.

## 2017-11-24 ENCOUNTER — Encounter (INDEPENDENT_AMBULATORY_CARE_PROVIDER_SITE_OTHER): Payer: Medicare HMO | Admitting: Ophthalmology

## 2017-11-25 ENCOUNTER — Telehealth: Payer: Self-pay | Admitting: Internal Medicine

## 2017-11-25 MED ORDER — WARFARIN SODIUM 3 MG PO TABS: 3.0000 mg | ORAL_TABLET | Freq: Every day | ORAL | 0 refills | Status: DC

## 2017-11-25 NOTE — Telephone Encounter (Signed)
°*  STAT* If patient is at the pharmacy, call can be transferred to refill team.   1. Which medications need to be refilled? (please list name of each medication and dose if known) warfarin   2. Which pharmacy/location (including street and city if local pharmacy) is medication to be sent to? [facility]   3. Do they need a 30 day or 90 day supply? [place]

## 2017-11-27 DIAGNOSIS — E1129 Type 2 diabetes mellitus with other diabetic kidney complication: Secondary | ICD-10-CM | POA: Diagnosis not present

## 2017-11-27 DIAGNOSIS — I10 Essential (primary) hypertension: Secondary | ICD-10-CM | POA: Diagnosis not present

## 2017-11-27 DIAGNOSIS — N183 Chronic kidney disease, stage 3 (moderate): Secondary | ICD-10-CM | POA: Diagnosis not present

## 2017-12-01 ENCOUNTER — Ambulatory Visit (INDEPENDENT_AMBULATORY_CARE_PROVIDER_SITE_OTHER): Payer: Medicare HMO | Admitting: Gastroenterology

## 2017-12-01 ENCOUNTER — Other Ambulatory Visit: Payer: Self-pay

## 2017-12-01 ENCOUNTER — Encounter: Payer: Self-pay | Admitting: Gastroenterology

## 2017-12-01 VITALS — BP 116/70 | HR 82 | Ht 66.0 in | Wt 194.2 lb

## 2017-12-01 DIAGNOSIS — R1310 Dysphagia, unspecified: Secondary | ICD-10-CM

## 2017-12-01 DIAGNOSIS — R1319 Other dysphagia: Secondary | ICD-10-CM

## 2017-12-01 NOTE — Progress Notes (Signed)
[name], MD [address]  [place]  [place], [place] [address]  Main: [phone]  Fax: [phone]    Gastroenterology Consultation  Referring Provider:     [name] * Primary Care Physician:  [name], DO Primary Gastroenterologist:  Dr. [name] Reason for Consultation:     Dysphagia to solids        HPI:   Lucas Caldwell is a 84 y.o. y/o male referred by Dr. Parks Ranger, [name], DO  for consultation & management of dysphagia for 3 months.  [name] here for hospital follow-up.  He has multiple comorbidities as listed below, significant for coronary artery disease, congestive heart failure, atrial fibrillation on anticoagulation with brilinta and Coumadin, CML, recent hospitalization on 12/19/2016 with AKI and 3 months history of intermittent dysphagia to solid food. He reports that eating chicken or beef or hard foods which are difficult to chew result in food stuck in upper chest. He denies trouble swallowing liquids or any other foods. Denies losing any weight. Denies abdominal pain, nausea, vomiting, heartburn or regurgitation. He denies having an upper endoscopy or a colonoscopy in the past. He had a barium esophagram today which did not reveal any obvious stricture or a space-occupying lesion or extrinsic compression. There was a possibility of esophageal spasm due to evidence of tertiary contractions of the distal third of the esophagus. [name] also evaluated by speech pathology and there was no evidence of oropharyngeal dysphagia. [name] on Pepcid as outpatient. He denies having any food impaction.  GI was consulted at that time, I performed upper endoscopy which revealed partially obstructing moderate size Schatzki's ring, I did not perform dilation as patient was on both Coumadin and Brilinta.  [name] here for hospital follow-up today.  He reports that since discharge he has been taking ranitidine 300 mg in the morning and  Protonix 40 mg before dinner.  He denies any heartburn but continues to have dysphagia to solid foods especially hard foods like chicken, beef, and ham.  He acknowledged that he tries to eat fast without chewing properly. [name] accompanied by his wife today.  He otherwise denies any complaints today and reports doing well.  [name] not smoking  Follow-up visit 12/01/2017 Patient underwent EGD, found to have widely patent, nonobstructing Schatzki's ring, which was dilated to 44mm as patient has symptoms of dysphagia. He reports that he felt better for 2 days after the procedure and symptoms recurred. He reports dysphagia to both solids and liquids, get stuck in his throat and associated with regurgitation.   GI Procedures:   EGD 12/20/2016 Findings: The duodenal bulb and second portion of the duodenum were normal. Diffuse mildly erythematous mucosa without bleeding was found in the entire examined stomach. A single localized, diminutive non-bleeding erosion was found at the incisura. There were stigmata of recent bleeding. A low-grade of narrowing and moderate Schatzki ring (acquired) was found in the lower third of the esophagus, this explains patient's dysphagia, dilation was not performed as pt is on both coumadin and brilinta. The upper third of the esophagus, middle third and lower third of the esophagus and gastroesophageal junction were otherwise normal. No evidence of esophageal cancer  EGD 11/03/2017 - Normal duodenal bulb and second portion of the duodenum. - 1 cm hiatal hernia. - Normal stomach. - Esophagogastric landmarks identified. - Non-obstructing Schatzki ring. Dilated to 62mm. - No specimens collected.  Past Medical History:  Diagnosis Date  . Arthritis    "  probably in his legs" (08/15/2016)  . Asthma   . Blind right eye   . BPH (benign prostatic hyperplasia)   . Breast asymmetry    Left breast is larger, present for several years.  [name] CAD (coronary artery disease)     a. Cath in the late 90's - reportedly ok;  b. 2014 s/p stenting x 2 @ [facility]; c 08/19/16 Cath/PCI with DES -> RCA, plan to treat LM 70% medically. Seen by surgery and felt to be too high risk for CABG.  . Chronic combined systolic (congestive) and diastolic (congestive) heart failure ([place])    a. Previously reduced EF-->50% by echo in 2012;  b. 06/2015 Echo: EF 50-55%  c. 07/2016 Echo: EF 45-50%.  . Chronic kidney disease (CKD), stage IV (severe) ([place])   . CML (chronic myelocytic leukemia) ([place])   . GERD (gastroesophageal reflux disease)   . Gout   . Hyperlipemia   . Hypertension   . Hypothyroidism   . Iron deficiency anemia   . Migraine    "in the 1960s" (08/15/2016)  . NICM (nonischemic cardiomyopathy) ([place])    a. Previously reduced EF-->50% by echo in 2012;  b. 06/2015 Echo: EF 50-55%, Gr2 DD, mild MR, mildly dil LA, Ao sclerosis, mild TR;  c. 07/2016 Echo: EF 45-50%, ? inf HK, mild MR, mildly dil LA.  [name] PAF (paroxysmal atrial fibrillation) ([facility])    a. ? Dx 2014-->s/p DCCV;  b. CHA2DS2VASc = 6-->chronic coumadin.  . Prostate cancer ([place])   . RBBB   . Type II diabetes mellitus ([place])     Past Surgical History:  Procedure Laterality Date  . CATARACT EXTRACTION W/ INTRAOCULAR LENS  IMPLANT, BILATERAL Bilateral   . CORONARY ANGIOPLASTY WITH STENT PLACEMENT  09/2012   2 stents  . CORONARY STENT INTERVENTION N/A 08/19/2016   Procedure: Coronary Stent Intervention;  Surgeon: [name], MD;  Location: [facility];  Service: Cardiovascular;  Laterality: N/A;  . ESOPHAGOGASTRODUODENOSCOPY N/A 12/20/2016   Procedure: ESOPHAGOGASTRODUODENOSCOPY (EGD);  Surgeon: Lin Landsman, MD;  Location: [facility] ENDOSCOPY;  Service: Gastroenterology;  Laterality: N/A;  . ESOPHAGOGASTRODUODENOSCOPY (EGD) WITH PROPOFOL N/A 11/03/2017   Procedure: ESOPHAGOGASTRODUODENOSCOPY (EGD) WITH PROPOFOL;  Surgeon: Lin Landsman, MD;  Location: [facility] END[facility]COPY;  Service: Gastroenterology;  Laterality: N/A;  .  HERNIA REPAIR     "[place]"  . LEFT HEART CATH AND CORONARY ANGIOGRAPHY N/A 08/14/2016   Procedure: Left Heart Cath and Coronary Angiography;  Surgeon: [name], MD;  Location: [facility];  Service: Cardiovascular;  Laterality: N/A;  . TOE AMPUTATION Right    "big toe"    Current Outpatient Medications:  .  acetaminophen (TYLENOL) 500 MG tablet, Take 500 mg by mouth every 6 (six) hours as needed., Disp: , Rfl:  .  albuterol (PROVENTIL HFA;VENTOLIN HFA) 108 (90 Base) MCG/ACT inhaler, Inhale 2 puffs into the lungs every 6 (six) hours as needed for wheezing or shortness of breath., Disp: 1 Inhaler, Rfl: 2 .  Alcohol Swabs 70 % PADS, Use when checking blood sugar up to 3x daily, Disp: 100 each, Rfl: 11 .  allopurinol (ZYLOPRIM) 100 MG tablet, Take 100 mg by mouth daily., Disp: , Rfl: 12 .  aspirin EC 81 MG tablet, Take 1 tablet (81 mg total) by mouth daily., Disp: 90 tablet, Rfl: 3 .  atorvastatin (LIPITOR) 80 MG tablet, Take 1 tablet (80 mg total) by mouth daily., Disp: 90 tablet, Rfl: 1 .  atropine 1 % ophthalmic solution, Apply to  eye., Disp: , Rfl:  .  bosutinib (BOSULIF) 100 MG tablet, Take 200 mg by mouth at bedtime. Take with food. , Disp: , Rfl:  .  Dulaglutide (TRULICITY) 4.03 KV/4.2VZ SOPN, Inject 0.75 mg into the skin once a week., Disp: 4 pen, Rfl: 0 .  enalapril (VASOTEC) 5 MG tablet, Take 1 tablet (5 mg total) by mouth daily., Disp: 90 tablet, Rfl: 3 .  fenofibrate 160 MG tablet, Take 1 tablet (160 mg total) by mouth daily., Disp: 90 tablet, Rfl: 1 .  ferrous sulfate 325 (65 FE) MG EC tablet, Take 1 tablet (325 mg total) by mouth 3 (three) times daily with meals., Disp: 270 tablet, Rfl: 3 .  fluticasone furoate-vilanterol (BREO ELLIPTA) 100-25 MCG/INH AEPB, Inhale 1 puff into the lungs daily., Disp: 28 each, Rfl: 5 .  gabapentin (NEURONTIN) 300 MG capsule, Take 1 capsule (300 mg total) by mouth 2 (two) times daily., Disp: 180 capsule, Rfl: 3 .  Insulin Detemir (LEVEMIR  FLEXTOUCH) 100 UNIT/ML Pen, Inject 50 Units into the skin daily. After breakfast, Disp: 45 mL, Rfl: 3 .  Insulin Pen Needle 31G X 5 MM MISC, 31 g by Does not apply route as directed., Disp: 100 each, Rfl: 5 .  isosorbide mononitrate (IMDUR) 60 MG 24 hr tablet, Take 1 tablet (60 mg total) by mouth daily., Disp: 90 tablet, Rfl: 3 .  levothyroxine (SYNTHROID, LEVOTHROID) 50 MCG tablet, Take 50 mcg by mouth daily before breakfast., Disp: , Rfl:  .  loperamide (IMODIUM A-D) 2 MG tablet, Take 2 mg by mouth 3 (three) times daily as needed for diarrhea or loose stools. , Disp: , Rfl:  .  magnesium oxide (MAG-OX) 400 MG tablet, Take 1 tablet (400 mg total) by mouth 2 (two) times daily., Disp: 180 tablet, Rfl: 3 .  metoprolol succinate (TOPROL-XL) 50 MG 24 hr tablet, Take 1 tablet (50 mg total) by mouth every morning., Disp: 90 tablet, Rfl: 3 .  Multiple Vitamins-Minerals (MULTIVITAMIN ADULTS 50+ PO), Take 1 tablet by mouth every morning., Disp: , Rfl:  .  nepafenac (NEVANAC) 0.1 % ophthalmic suspension, Place 1 drop into both eyes 3 (three) times daily as needed., Disp: , Rfl:  .  nitroGLYCERIN (NITROSTAT) 0.4 MG SL tablet, Place 1 tablet (0.4 mg total) under the tongue every 5 (five) minutes x 3 doses as needed for chest pain., Disp: 75 tablet, Rfl: 2 .  omeprazole (PRILOSEC) 40 MG capsule, Take 1 capsule (40 mg total) by mouth daily before breakfast., Disp: 90 capsule, Rfl: 0 .  ranitidine (ZANTAC) 300 MG tablet, Take 1 tablet (300 mg total) by mouth daily., Disp: 90 tablet, Rfl: 3 .  Spacer/Aero-Holding Chambers (OPTICHAMBER ADVANTAGE) MISC, 1 each by Other route once., Disp: 1 each, Rfl: 0 .  tamsulosin (FLOMAX) 0.4 MG CAPS capsule, Take 0.4 mg by mouth., Disp: , Rfl:  .  torsemide (DEMADEX) 20 MG tablet, Take 20 mg by mouth daily., Disp: , Rfl:  .  warfarin (COUMADIN) 3 MG tablet, Take 1 tablet (3 mg total) by mouth at bedtime., Disp: 30 tablet, Rfl: 0   Family History  Problem Relation Age of Onset    . Brain cancer Father   . Diabetes Sister   . Heart attack Sister      Social History   Tobacco Use  . Smoking status: Former Smoker    Packs/day: 2.00    Years: 15.00    Pack years: 30.00    Types: Cigarettes  . Smokeless tobacco: Former  User  . Tobacco comment: quit 1979  Substance Use Topics  . Alcohol use: No    Comment: previously drank heavily - quit 1979.  . Drug use: No    Allergies as of 12/01/2017 - Review Complete 12/01/2017  Allergen Reaction Noted  . Clopidogrel Anaphylaxis 07/06/2012  . Ciprofloxacin Itching 10/14/2012  . Spironolactone  03/29/2014    Review of Systems:    All systems reviewed and negative except where noted in HPI.   Physical Exam:  BP 116/70   Pulse 82   Ht 5\' 6"  (1.676 m)   Wt 194 lb 3.2 oz (88.1 kg)   BMI 31.34 kg/m  No LMP for male patient.  General:   Alert,  Well-developed, well-nourished, pleasant and cooperative in NAD Head:  Normocephalic and atraumatic. Eyes:  Sclera clear, no icterus.   Conjunctiva pink. Ears:  Normal auditory acuity. Nose:  No deformity, discharge, or lesions. Mouth:  No deformity or lesions,oropharynx pink & moist. Neck:  Supple; no masses or thyromegaly. Lungs:  Respirations even and unlabored.  Clear throughout to auscultation.   No wheezes, crackles, or rhonchi. No acute distress. Heart:  Regular rate and rhythm; no murmurs, clicks, rubs, or gallops. Abdomen:  Normal bowel sounds. Soft, non-tender and non-distended without masses, hepatosplenomegaly or hernias noted.  No guarding or rebound tenderness.   Rectal: Nor performed Msk:  Symmetrical without gross deformities. Good, equal movement & strength bilaterally. Pulses:  Normal pulses noted. Extremities:  No clubbing or edema.  No cyanosis. Neurologic:  Alert and oriented x3;  grossly normal neurologically. Skin:  Intact without significant lesions or rashes. No jaundice. Psych:  Alert and cooperative. Normal mood and affect.  Imaging  Studies: X-ray upper GI 12/19/2016 FINDINGS: There was normal pharyngeal anatomy and motility. Contrast flowed freely through the esophagus without evidence of stricture or mass. There was normal esophageal mucosa without evidence of irregularity or ulceration. Tertiary contractions of the distal third of the esophagus as can be seen with esophageal spasm. No evidence of reflux. No definite hiatal hernia was demonstrated.  At the end of the examination a 13 mm barium tablet was administered which transited through the esophagus and esophagogastric junction without delay.  IMPRESSION: 1. Tertiary contractions of the distal third of the esophagus as can be seen with esophageal spasm. 2. No esophageal stricture.  Assessment and Plan:   Lucas Caldwell is a 84 y.o. male with coronary artery disease, congestive heart failure, atrial fibrillation on anticoagulation with brilinta and Coumadin, CML, recent hospitalization on 12/19/2016 with AKI and 3 months history of  dysphagia to solids and liquids.  EGD showed Nonobstructing Schatzki's ring which was empirically dilated to 88mm with balloon. The patient is able to maintain his weight. He continues to have symptoms of dysphagia   - Continue Prilosec 40 mg daily - recommend esophageal manometry to evaluate for any underlying esophageal dysmotility  Follow up after the manometry results  [name], MD

## 2017-12-04 ENCOUNTER — Other Ambulatory Visit: Admit: 2017-12-04 | Discharge: 2017-12-05 | Payer: MEDICARE

## 2017-12-04 ENCOUNTER — Ambulatory Visit: Admit: 2017-12-04 | Discharge: 2017-12-05 | Payer: MEDICARE

## 2017-12-04 DIAGNOSIS — C9210 Chronic myeloid leukemia, BCR/ABL-positive, not having achieved remission: Principal | ICD-10-CM

## 2017-12-04 DIAGNOSIS — Z79899 Other long term (current) drug therapy: Secondary | ICD-10-CM | POA: Diagnosis not present

## 2017-12-04 DIAGNOSIS — Z9221 Personal history of antineoplastic chemotherapy: Secondary | ICD-10-CM | POA: Diagnosis not present

## 2017-12-08 ENCOUNTER — Telehealth: Payer: Self-pay

## 2017-12-08 DIAGNOSIS — R630 Anorexia: Secondary | ICD-10-CM

## 2017-12-08 MED ORDER — MIRTAZAPINE 15 MG PO TABS
15.0000 mg | ORAL_TABLET | Freq: Every day | ORAL | 5 refills | Status: DC
Start: 2017-12-08 — End: 2018-01-01

## 2017-12-08 NOTE — Telephone Encounter (Signed)
Patients wife called asking if there is anything you can give Gwyndolyn Saxon for decrease appetite.  She reports that this has been going on for 2 weeks - 1 month.

## 2017-12-08 NOTE — Telephone Encounter (Signed)
Patient used to be on Mirtazapine (remeron) 15mg  in past, unsure if this was effective or if other issues. Do not see it documented in past notes, this was 1-2 years ago.  Without further evaluation, I would recommend repeat trial of this medication he has been off for >1-2 years. He agrees with plan, does not recall this med. I sent new rx Mirtazapine 15mg  nightly to his [facility]  May take several weeks for full effect.  Follow-up if not improving  [name], DO [facility] 12/08/2017, 5:38 PM

## 2017-12-09 ENCOUNTER — Telehealth: Payer: Self-pay

## 2017-12-09 ENCOUNTER — Telehealth: Payer: Self-pay | Admitting: Family Medicine

## 2017-12-09 NOTE — Telephone Encounter (Signed)
Patient's spouse state he is feeling weak, no appetite and diarrhea whole night the med's Rx Mirtazapine is not improving his Sx's as per Dr Raliegh Ip advised patient that it can taker little longer to be effective and if he is feeling weak then he needs to be seen by other specialist and also advised him to go to urgent care if diarrhea still persist. They agree with suggestion and if not improving by tomorrow will go to urgent care or ER.

## 2017-12-09 NOTE — Telephone Encounter (Signed)
Reviewed this recent triage and conversation.  Patient was started on Mirtazapine yesterday for chronic poor appetite. He has been on this med before. I advised that this is unlikely new cause of any symptoms, as it was just started and symptoms have been present for a while. Also that medicine may take up to several days to weeks to take effect and dose may need to be adjusted in future.  Given his symptoms now with diarrhea, at risk of dehydration likely some GI component to poor appetite, or other illness. I recommend that he check in with his specialists. He may need to return to [facility] - he was just seen on 12/01/17 for dysphagia and swallowing. I would check back with them about these new symptoms as well.  Otherwise, if weakness and dehydrated he may need urgent care or hospital evaluation if cannot take in PO food/fluids.  Future if not more longer term issue may need updates from Cardiology / Renal regarding his weakness.  [name], [facility] 12/09/2017, 5:45 PM

## 2017-12-09 NOTE — Progress Notes (Signed)
Follow-up Outpatient Visit Date: 12/10/2017  Primary Care Provider: [name], DO [address] [id number]  Chief Complaint: Follow-up CAD and cardiomyopathy  HPI:  Lucas Caldwell is a 84 y.o. year-old male with history of coronary artery disease, mixed ischemic and nonischemic cardiomyopathy, hypertension, hyperlipidemia, type 2 diabetes mellitus, paroxysmal atrial fibrillation, chronic kidney disease stage III, CML, iron deficiency anemia, BPH, and hypothyroidism, who presents for follow-up of coronary artery disease and cardiomyopathy.  He was last seen in our office by Lucas Bayley, NP, in June.  He continued to do well from a breathing standpoint at that time.  He subsequently underwent EGD and esophageal dilation last month by Dr. Marius Caldwell.  Today, Lucas Caldwell is concerned about recent anorexia and belching that began after esophageal dilation.  He also notes burning pain in the chest when eating/swallowing.  [name] now scheduled for esophageal manometry next month.  He also experienced severe diarrhea after starting mirtazapine for appetite stimulation.  He only took one days but notes that his bowels still have not returned to normal.  Mr. Serfass denies anginal chest pain, shortness of breath, edema, palpitations, lightheadedness, and orthopnea.  [name] tolerating his medications well; no bleeding, remaining on warfarin.  --------------------------------------------------------------------------------------------------  Past Medical History:  Diagnosis Date  . Arthritis    "probably in his legs" (08/15/2016)  . Asthma   . Blind right eye   . BPH (benign prostatic hyperplasia)   . Breast asymmetry    Left breast is larger, present for several years.  Lucas Caldwell CAD (coronary artery disease)    a. Cath in the late 90's - reportedly ok;  b. 2014 s/p stenting x 2 @ [facility]; c 08/19/16 Cath/PCI with DES -> RCA, plan to treat LM 70% medically. Seen by surgery and felt to be too high  risk for CABG.  . Chronic combined systolic (congestive) and diastolic (congestive) heart failure ([place])    a. Previously reduced EF-->50% by echo in 2012;  b. 06/2015 Echo: EF 50-55%  c. 07/2016 Echo: EF 45-50%.  . Chronic kidney disease (CKD), stage IV (severe) ([place])   . CML (chronic myelocytic leukemia) ([place])   . GERD (gastroesophageal reflux disease)   . Gout   . Hyperlipemia   . Hypertension   . Hypothyroidism   . Iron deficiency anemia   . Migraine    "in the 1960s" (08/15/2016)  . NICM (nonischemic cardiomyopathy) ([place])    a. Previously reduced EF-->50% by echo in 2012;  b. 06/2015 Echo: EF 50-55%, Gr2 DD, mild MR, mildly dil LA, Ao sclerosis, mild TR;  c. 07/2016 Echo: EF 45-50%, ? inf HK, mild MR, mildly dil LA.  Lucas Caldwell PAF (paroxysmal atrial fibrillation) ([facility])    a. ? Dx 2014-->s/p DCCV;  b. CHA2DS2VASc = 6-->chronic coumadin.  . Prostate cancer ([place])   . RBBB   . Type II diabetes mellitus ([place])    Past Surgical History:  Procedure Laterality Date  . CATARACT EXTRACTION W/ INTRAOCULAR LENS  IMPLANT, BILATERAL Bilateral   . CORONARY ANGIOPLASTY WITH STENT PLACEMENT  09/2012   2 stents  . CORONARY STENT INTERVENTION N/A 08/19/2016   Procedure: Coronary Stent Intervention;  Surgeon: Lucas Bush, MD;  Location: [facility];  Service: Cardiovascular;  Laterality: N/A;  . ESOPHAGOGASTRODUODENOSCOPY N/A 12/20/2016   Procedure: ESOPHAGOGASTRODUODENOSCOPY (EGD);  Surgeon: Lucas Landsman, MD;  Location: [facility] ENDOSCOPY;  Service: Gastroenterology;  Laterality: N/A;  . ESOPHAGOGASTRODUODENOSCOPY (EGD) WITH PROPOFOL N/A 11/03/2017   Procedure:  ESOPHAGOGASTRODUODENOSCOPY (EGD) WITH PROPOFOL;  Surgeon: Lucas Landsman, MD;  Location: [facility] ENDOSCOPY;  Service: Gastroenterology;  Laterality: N/A;  . HERNIA REPAIR     "navel"  . LEFT HEART CATH AND CORONARY ANGIOGRAPHY N/A 08/14/2016   Procedure: Left Heart Cath and Coronary Angiography;  Surgeon: Lucas Bush, MD;  Location:  [facility];  Service: Cardiovascular;  Laterality: N/A;  . TOE AMPUTATION Right    "big toe"    Current Meds  Medication Sig  . acetaminophen (TYLENOL) 500 MG tablet Take 500 mg by mouth every 6 (six) hours as needed.  Lucas Caldwell albuterol (PROVENTIL HFA;VENTOLIN HFA) 108 (90 Base) MCG/ACT inhaler Inhale 2 puffs into the lungs every 6 (six) hours as needed for wheezing or shortness of breath.  . Alcohol Swabs 70 % PADS Use when checking blood sugar up to 3x daily  . allopurinol (ZYLOPRIM) 100 MG tablet Take 100 mg by mouth daily.  Lucas Caldwell aspirin EC 81 MG tablet Take 1 tablet (81 mg total) by mouth daily.  Lucas Caldwell atorvastatin (LIPITOR) 80 MG tablet Take 1 tablet (80 mg total) by mouth daily.  Lucas Caldwell atropine 1 % ophthalmic solution Apply to eye.  . bosutinib (BOSULIF) 100 MG tablet Take 200 mg by mouth at bedtime. Take with food.   . Dulaglutide (TRULICITY) 7.89 FY/1.0FB SOPN Inject 0.75 mg into the skin once a week.  . enalapril (VASOTEC) 5 MG tablet Take 1 tablet (5 mg total) by mouth daily.  . fenofibrate 160 MG tablet Take 1 tablet (160 mg total) by mouth daily.  . ferrous sulfate 325 (65 FE) MG EC tablet Take 1 tablet (325 mg total) by mouth 3 (three) times daily with meals.  . fluticasone furoate-vilanterol (BREO ELLIPTA) 100-25 MCG/INH AEPB Inhale 1 puff into the lungs daily.  Lucas Caldwell gabapentin (NEURONTIN) 300 MG capsule Take 1 capsule (300 mg total) by mouth 2 (two) times daily.  . Insulin Pen Needle 31G X 5 MM MISC 31 g by Does not apply route as directed.  . isosorbide mononitrate (IMDUR) 60 MG 24 hr tablet Take 1 tablet (60 mg total) by mouth daily.  Lucas Caldwell levothyroxine (SYNTHROID, LEVOTHROID) 50 MCG tablet Take 50 mcg by mouth daily before breakfast.  . loperamide (IMODIUM A-D) 2 MG tablet Take 2 mg by mouth 3 (three) times daily as needed for diarrhea or loose stools.   . metoprolol succinate (TOPROL-XL) 50 MG 24 hr tablet Take 1 tablet (50 mg total) by mouth every morning.  . Multiple  Vitamins-Minerals (MULTIVITAMIN ADULTS 50+ PO) Take 1 tablet by mouth every morning.  . nepafenac (NEVANAC) 0.1 % ophthalmic suspension Place 1 drop into both eyes 3 (three) times daily as needed.  . nitroGLYCERIN (NITROSTAT) 0.4 MG SL tablet Place 1 tablet (0.4 mg total) under the tongue every 5 (five) minutes x 3 doses as needed for chest pain.  Lucas Caldwell omeprazole (PRILOSEC) 40 MG capsule Take 1 capsule (40 mg total) by mouth daily before breakfast.  . ranitidine (ZANTAC) 300 MG tablet Take 1 tablet (300 mg total) by mouth daily.  Lucas Caldwell Spacer/Aero-Holding Chambers (OPTICHAMBER ADVANTAGE) MISC 1 each by Other route once.  . tamsulosin (FLOMAX) 0.4 MG CAPS capsule Take 0.4 mg by mouth.  . torsemide (DEMADEX) 20 MG tablet Take 20 mg by mouth daily.  Lucas Caldwell warfarin (COUMADIN) 3 MG tablet Take 1 tablet (3 mg total) by mouth at bedtime.    Allergies: Clopidogrel; Ciprofloxacin; and Spironolactone  Social History   Tobacco Use  . Smoking status: Former  Smoker    Packs/day: 2.00    Years: 15.00    Pack years: 30.00    Types: Cigarettes  . Smokeless tobacco: Former Systems developer  . Tobacco comment: quit 1979  Substance Use Topics  . Alcohol use: No    Comment: previously drank heavily - quit 1979.  . Drug use: No    Family History  Problem Relation Age of Onset  . Brain cancer Father   . Diabetes Sister   . Heart attack Sister     Review of Systems: A 12-system review of systems was performed and was negative except as noted in the HPI.  --------------------------------------------------------------------------------------------------  Physical Exam: BP 130/74 (BP Location: Left Arm, Patient Position: Sitting, Cuff Size: Normal)   Pulse 84   Ht 5\' 6"  (1.676 m)   Wt 189 lb 8 oz (86 kg)   BMI 30.59 kg/m   General:  NAD. HEENT: No conjunctival pallor or scleral icterus. Moist mucous membranes.  OP clear. Neck: Supple without lymphadenopathy, thyromegaly, JVD, or HJR.  Lungs: Normal work of  breathing. Clear to auscultation bilaterally without wheezes or crackles. Heart: Regular rate and rhythm without murmurs, rubs, or gallops. Non-displaced PMI. Abd: Bowel sounds present. Soft, NT/ND without hepatosplenomegaly Ext: No lower extremity edema. Radial, PT, and DP pulses are 2+ bilaterally. Skin: Warm and dry without rash.  EKG:  NSR with RBBB and LAFB.  Lab Results  Component Value Date   WBC 6.9 05/21/2017   HGB 11.9 (L) 05/21/2017   HCT 36.4 (L) 05/21/2017   MCV 84.3 05/21/2017   PLT 214 05/21/2017    Lab Results  Component Value Date   NA 132 (L) 09/02/2017   K 4.6 09/02/2017   CL 94 (L) 09/02/2017   CO2 23 09/02/2017   BUN 31 (H) 09/02/2017   CREATININE 1.98 (H) 09/02/2017   GLUCOSE 282 (H) 09/02/2017   ALT 40 01/27/2017    Lab Results  Component Value Date   CHOL 99 01/08/2017   HDL 21 (L) 01/08/2017   LDLCALC 54 01/08/2017   TRIG 118 01/08/2017   CHOLHDL 4.7 01/08/2017    --------------------------------------------------------------------------------------------------  ASSESSMENT AND PLAN: CAD No angina.  Continue current medications for secondary prevention.  If concerns about bleeding develop, discontinuation of aspirin could be considered in the future.  Chronic systolic heart failure Mr. Marion appears euvolemic and well-compensated with NYHA class II symptoms.  Continue current medications, including metoprolol succinate, enalapril, and torsemide, as well as ASA 81 mg daily and warfarin.  Paroxysmal atrial fibrillation No palpitations.  EKG today shows NSR.  Continue metoprolol and warfarin.  Hyperlipidemia LDL at goal.  Continue rosuvastatin, which Mr. Huhn is tolerating much better than atorvastatin.  Hypertension BP borderline elevated.  No medication changes today.  Follow-up: Return to clinic in 3 months.  Lucas Bush, MD 12/10/2017 3:26 PM

## 2017-12-09 NOTE — Telephone Encounter (Signed)
Pt was prescribed mirtazapine for no appetite.  Pt is really weak and wife asked for a call back [phone]

## 2017-12-10 ENCOUNTER — Ambulatory Visit (INDEPENDENT_AMBULATORY_CARE_PROVIDER_SITE_OTHER): Payer: Medicare HMO | Admitting: Internal Medicine

## 2017-12-10 ENCOUNTER — Encounter: Payer: Self-pay | Admitting: Internal Medicine

## 2017-12-10 ENCOUNTER — Ambulatory Visit (INDEPENDENT_AMBULATORY_CARE_PROVIDER_SITE_OTHER): Payer: Medicare HMO

## 2017-12-10 VITALS — BP 130/74 | HR 84 | Ht 66.0 in | Wt 189.5 lb

## 2017-12-10 DIAGNOSIS — I10 Essential (primary) hypertension: Secondary | ICD-10-CM | POA: Diagnosis not present

## 2017-12-10 DIAGNOSIS — Z7901 Long term (current) use of anticoagulants: Secondary | ICD-10-CM

## 2017-12-10 DIAGNOSIS — I480 Paroxysmal atrial fibrillation: Secondary | ICD-10-CM | POA: Diagnosis not present

## 2017-12-10 DIAGNOSIS — I5022 Chronic systolic (congestive) heart failure: Secondary | ICD-10-CM | POA: Diagnosis not present

## 2017-12-10 DIAGNOSIS — I2510 Atherosclerotic heart disease of native coronary artery without angina pectoris: Secondary | ICD-10-CM

## 2017-12-10 DIAGNOSIS — E785 Hyperlipidemia, unspecified: Secondary | ICD-10-CM | POA: Diagnosis not present

## 2017-12-10 DIAGNOSIS — C9210 Chronic myeloid leukemia, BCR/ABL-positive, not having achieved remission: Secondary | ICD-10-CM

## 2017-12-10 LAB — POCT INR: INR: 4.1 — AB (ref 2.0–3.0)

## 2017-12-10 NOTE — Patient Instructions (Signed)
Medication Instructions: - Your physician recommends that you continue on your current medications as directed. Please refer to the Current Medication list given to you today.  Labwork: - none ordered  Procedures/Testing: - none ordered  Follow-Up: - Your physician recommends that you schedule a follow-up appointment in: 3 months with Dr. Saunders Revel APP   Any Additional Special Instructions Will Be Listed Below (If Applicable).     If you need a refill on your cardiac medications before your next appointment, please call your pharmacy.

## 2017-12-10 NOTE — Patient Instructions (Addendum)
Please skip coumadin today, then START NEW DOSAGE of 1 TABLET EVERY DAY EXCEPT 1/2 TABLET ON [place]. Recheck INR in 2 weeks.

## 2017-12-10 NOTE — Telephone Encounter (Signed)
The pt was notified and he verbalize understanding. He informed me that he's feeling extremely weak.I recommended that he continue to drink plenty of fluids to try to prevent dehydration.  He informed me that he currently have an appointment scheduled today with his cardiologist and plans on discussing it with him. He also verbalize understanding about contacting his GI doctor and notifying them of his new symptoms.

## 2017-12-11 ENCOUNTER — Encounter: Payer: Self-pay | Admitting: Internal Medicine

## 2017-12-15 ENCOUNTER — Ambulatory Visit: Payer: Medicare HMO | Admitting: Gastroenterology

## 2017-12-16 ENCOUNTER — Telehealth: Payer: Self-pay | Admitting: Gastroenterology

## 2017-12-16 ENCOUNTER — Encounter
Admission: RE | Disposition: A | Discharge status: Home / Self Care | Payer: Self-pay | Source: Ambulatory Visit | Attending: Gastroenterology

## 2017-12-16 ENCOUNTER — Ambulatory Visit
Admission: RE | Admit: 2017-12-16 | Discharge: 2017-12-16 | Disposition: A | Discharge status: Home / Self Care | Payer: Medicare HMO | Source: Ambulatory Visit | Attending: Gastroenterology | Admitting: Gastroenterology

## 2017-12-16 ENCOUNTER — Encounter: Payer: Self-pay | Admitting: Anesthesiology

## 2017-12-16 DIAGNOSIS — R1310 Dysphagia, unspecified: Secondary | ICD-10-CM | POA: Diagnosis not present

## 2017-12-16 HISTORY — PX: ESOPHAGEAL MANOMETRY: SHX5429

## 2017-12-16 SURGERY — MANOMETRY, ESOPHAGUS

## 2017-12-16 MED ORDER — BUTAMBEN-TETRACAINE-BENZOCAINE 2-2-14 % EX AERO
INHALATION_SPRAY | CUTANEOUS | Status: DC | PRN
  Administered 2017-12-16: 2 via TOPICAL

## 2017-12-16 MED ORDER — LIDOCAINE HCL URETHRAL/MUCOSAL 2 % EX GEL
CUTANEOUS | Status: DC | PRN
  Administered 2017-12-16: 1

## 2017-12-16 MED ORDER — BUTAMBEN-TETRACAINE-BENZOCAINE 2-2-14 % EX AERO
INHALATION_SPRAY | CUTANEOUS | Status: AC
  Filled 2017-12-16: qty 5

## 2017-12-16 MED ORDER — LIDOCAINE HCL URETHRAL/MUCOSAL 2 % EX GEL
CUTANEOUS | Status: AC
  Filled 2017-12-16: qty 5

## 2017-12-16 SURGICAL SUPPLY — 2 items
FACESHIELD LNG OPTICON STERILE (SAFETY) IMPLANT
GLOVE BIO SURGEON STRL SZ8 (GLOVE) ×6 IMPLANT

## 2017-12-16 NOTE — Telephone Encounter (Signed)
Pt wife is calling for pt results please call her

## 2017-12-16 NOTE — Telephone Encounter (Signed)
Patient's spouse called and would like the results from his upper endoscopy done 12-16-17.

## 2017-12-16 NOTE — Telephone Encounter (Signed)
Patient has been notified of lab results  

## 2017-12-17 ENCOUNTER — Encounter: Payer: Self-pay | Admitting: Gastroenterology

## 2017-12-18 ENCOUNTER — Encounter: Payer: Self-pay | Admitting: Gastroenterology

## 2017-12-18 ENCOUNTER — Ambulatory Visit (INDEPENDENT_AMBULATORY_CARE_PROVIDER_SITE_OTHER): Payer: Medicare HMO | Admitting: Gastroenterology

## 2017-12-18 VITALS — BP 113/72 | HR 75 | Resp 17 | Ht 66.0 in | Wt 196.6 lb

## 2017-12-18 DIAGNOSIS — R140 Abdominal distension (gaseous): Secondary | ICD-10-CM | POA: Diagnosis not present

## 2017-12-18 DIAGNOSIS — R1319 Other dysphagia: Secondary | ICD-10-CM | POA: Diagnosis not present

## 2017-12-18 DIAGNOSIS — K529 Noninfective gastroenteritis and colitis, unspecified: Secondary | ICD-10-CM | POA: Diagnosis not present

## 2017-12-18 DIAGNOSIS — R6881 Early satiety: Secondary | ICD-10-CM | POA: Diagnosis not present

## 2017-12-18 NOTE — Progress Notes (Signed)
Lucas Darby, MD [address]  [place]  [place], [place] [address]  Main: [phone]  Fax: [phone]    Gastroenterology Consultation  Referring Provider:     Nobie Caldwell * Primary Care Physician:  Lucas Hauser, DO Primary Gastroenterologist:  Dr. Cephas Caldwell Reason for Consultation:     Dysphagia to solids        HPI:   Lucas Caldwell is a 84 y.o. y/o male referred by Dr. Parks Caldwell, Lucas Doughty, DO  for consultation & management of dysphagia for 3 months.  [name] here for hospital follow-up.  He has multiple comorbidities as listed below, significant for coronary artery disease, congestive heart failure, atrial fibrillation on anticoagulation with brilinta and Coumadin, CML, recent hospitalization on 12/19/2016 with AKI and 3 months history of intermittent dysphagia to solid food. He reports that eating chicken or beef or hard foods which are difficult to chew result in food stuck in upper chest. He denies trouble swallowing liquids or any other foods. Denies losing any weight. Denies abdominal pain, nausea, vomiting, heartburn or regurgitation. He denies having an upper endoscopy or a colonoscopy in the past. He had a barium esophagram today which did not reveal any obvious stricture or a space-occupying lesion or extrinsic compression. There was a possibility of esophageal spasm due to evidence of tertiary contractions of the distal third of the esophagus. [name] also evaluated by speech pathology and there was no evidence of oropharyngeal dysphagia. [name] on Pepcid as outpatient. He denies having any food impaction.  GI was consulted at that time, I performed upper endoscopy which revealed partially obstructing moderate size Schatzki's ring, I did not perform dilation as patient was on both Coumadin and Brilinta.  [name] here for hospital follow-up today.  He reports that since discharge he has been taking ranitidine 300 mg in the morning and  Protonix 40 mg before dinner.  He denies any heartburn but continues to have dysphagia to solid foods especially hard foods like chicken, beef, and ham.  He acknowledged that he tries to eat fast without chewing properly. [name] accompanied by his wife today.  He otherwise denies any complaints today and reports doing well.  [name] not smoking  Follow-up visit 12/01/2017 Patient underwent EGD, found to have widely patent, nonobstructing Schatzki's ring, which was dilated to 53mm as patient has symptoms of dysphagia. He reports that he felt better for 2 days after the procedure and symptoms recurred. He reports dysphagia to both solids and liquids, get stuck in his throat and associated with regurgitation.   Follow-up visit 12/18/2017 Patient underwent esophageal manometry this week.  Awaiting results.  He continues to have sensation of food stuck in his throat associated with regurgitation, early satiety, bloating, gas.  He also reports intermittent nonbloody diarrhea and takes Imodium as needed.  He took Imodium yesterday and did not have bowel movement since then.  Patient appears to be frustrated with ongoing upper GI symptoms.  [name] taking omeprazole 40 mg once a day that was started by me.  However, his symptoms are persistent.  [name] also taking ranitidine 300 mg daily.  He reports that this is working better compared to omeprazole.  Despite these symptoms, patient's weight has been stable in the last 1 year.  He said he stopped eating chicken from [name] and hard meats because of these symptoms.  His son accompanied him today in order to help understand his condition.  Patient's  wife reports that he drinks sweetened tea, coffee with artificial sweeteners regularly.  He does drink soda intermittently. Patient is on insulin for diabetes which he is not well controlled, latest hemoglobin A1c 10.3 on 10/24/2017  GI Procedures:  Colonoscopy in 2001 at [facility], reportedly normal EGD 12/20/2016 Findings: The  duodenal bulb and second portion of the duodenum were normal. Diffuse mildly erythematous mucosa without bleeding was found in the entire examined stomach. A single localized, diminutive non-bleeding erosion was found at the incisura. There were stigmata of recent bleeding. A low-grade of narrowing and moderate Schatzki ring (acquired) was found in the lower third of the esophagus, this explains patient's dysphagia, dilation was not performed as pt is on both coumadin and brilinta. The upper third of the esophagus, middle third and lower third of the esophagus and gastroesophageal junction were otherwise normal. No evidence of esophageal cancer  EGD 11/03/2017 - Normal duodenal bulb and second portion of the duodenum. - 1 cm hiatal hernia. - Normal stomach. - Esophagogastric landmarks identified. - Non-obstructing Schatzki ring. Dilated to 57mm. - No specimens collected.  Past Medical History:  Diagnosis Date  . Arthritis    "probably in his legs" (08/15/2016)  . Asthma   . Blind right eye   . BPH (benign prostatic hyperplasia)   . Breast asymmetry    Left breast is larger, present for several years.  [name] CAD (coronary artery disease)    a. Cath in the late 90's - reportedly ok;  b. 2014 s/p stenting x 2 @ [facility]; c 08/19/16 Cath/PCI with DES -> RCA, plan to treat LM 70% medically. Seen by surgery and felt to be too high risk for CABG.  . Chronic combined systolic (congestive) and diastolic (congestive) heart failure ([place])    a. Previously reduced EF-->50% by echo in 2012;  b. 06/2015 Echo: EF 50-55%  c. 07/2016 Echo: EF 45-50%.  . Chronic kidney disease (CKD), stage IV (severe) ([place])   . CML (chronic myelocytic leukemia) ([place])   . GERD (gastroesophageal reflux disease)   . Gout   . Hyperlipemia   . Hypertension   . Hypothyroidism   . Iron deficiency anemia   . Migraine    "in the 1960s" (08/15/2016)  . NICM (nonischemic cardiomyopathy) ([place])    a. Previously reduced EF-->50% by echo in  2012;  b. 06/2015 Echo: EF 50-55%, Gr2 DD, mild MR, mildly dil LA, Ao sclerosis, mild TR;  c. 07/2016 Echo: EF 45-50%, ? inf HK, mild MR, mildly dil LA.  [name] PAF (paroxysmal atrial fibrillation) ([facility])    a. ? Dx 2014-->s/p DCCV;  b. CHA2DS2VASc = 6-->chronic coumadin.  . Prostate cancer ([place])   . RBBB   . Type II diabetes mellitus ([place])     Past Surgical History:  Procedure Laterality Date  . CATARACT EXTRACTION W/ INTRAOCULAR LENS  IMPLANT, BILATERAL Bilateral   . CORONARY ANGIOPLASTY WITH STENT PLACEMENT  09/2012   2 stents  . CORONARY STENT INTERVENTION N/A 08/19/2016   Procedure: Coronary Stent Intervention;  Surgeon: [name], MD;  Location: [place] [facility];  Service: Cardiovascular;  Laterality: N/A;  . ESOPHAGEAL MANOMETRY N/A 12/16/2017   Procedure: ESOPHAGEAL MANOMETRY (EM);  Surgeon: Lin Landsman, MD;  Location: [facility] ENDOSCOPY;  Service: Gastroenterology;  Laterality: N/A;  . ESOPHAGOGASTRODUODENOSCOPY N/A 12/20/2016   Procedure: ESOPHAGOGASTRODUODENOSCOPY (EGD);  Surgeon: Lin Landsman, MD;  Location: [facility] ENDOSCOPY;  Service: Gastroenterology;  Laterality: N/A;  . ESOPHAGOGASTRODUODENOSCOPY (EGD) WITH PROPOFOL N/A 11/03/2017   Procedure:  ESOPHAGOGASTRODUODENOSCOPY (EGD) WITH PROPOFOL;  Surgeon: Lin Landsman, MD;  Location: [facility] ENDOSCOPY;  Service: Gastroenterology;  Laterality: N/A;  . HERNIA REPAIR     "navel"  . LEFT HEART CATH AND CORONARY ANGIOGRAPHY N/A 08/14/2016   Procedure: Left Heart Cath and Coronary Angiography;  Surgeon: [name], MD;  Location: [place] [facility];  Service: Cardiovascular;  Laterality: N/A;  . TOE AMPUTATION Right    "big toe"    Current Outpatient Medications:  .  acetaminophen (TYLENOL) 500 MG tablet, Take 500 mg by mouth every 6 (six) hours as needed., Disp: , Rfl:  .  albuterol (PROVENTIL HFA;VENTOLIN HFA) 108 (90 Base) MCG/ACT inhaler, Inhale 2 puffs into the lungs every 6 (six) hours as needed for wheezing  or shortness of breath., Disp: 1 Inhaler, Rfl: 2 .  Alcohol Swabs 70 % PADS, Use when checking blood sugar up to 3x daily, Disp: 100 each, Rfl: 11 .  allopurinol (ZYLOPRIM) 100 MG tablet, Take 100 mg by mouth daily., Disp: , Rfl: 12 .  aspirin EC 81 MG tablet, Take 1 tablet (81 mg total) by mouth daily., Disp: 90 tablet, Rfl: 3 .  atorvastatin (LIPITOR) 80 MG tablet, Take 1 tablet (80 mg total) by mouth daily., Disp: 90 tablet, Rfl: 1 .  atropine 1 % ophthalmic solution, Apply to eye., Disp: , Rfl:  .  bosutinib (BOSULIF) 100 MG tablet, Take 200 mg by mouth at bedtime. Take with food. , Disp: , Rfl:  .  Dulaglutide (TRULICITY) 0.86 PY/1.9JK SOPN, Inject 0.75 mg into the skin once a week., Disp: 4 pen, Rfl: 0 .  enalapril (VASOTEC) 5 MG tablet, Take 1 tablet (5 mg total) by mouth daily., Disp: 90 tablet, Rfl: 3 .  fenofibrate 160 MG tablet, Take 1 tablet (160 mg total) by mouth daily., Disp: 90 tablet, Rfl: 1 .  gabapentin (NEURONTIN) 300 MG capsule, Take 1 capsule (300 mg total) by mouth 2 (two) times daily., Disp: 180 capsule, Rfl: 3 .  Insulin Pen Needle 31G X 5 MM MISC, 31 g by Does not apply route as directed., Disp: 100 each, Rfl: 5 .  isosorbide mononitrate (IMDUR) 60 MG 24 hr tablet, Take 1 tablet (60 mg total) by mouth daily., Disp: 90 tablet, Rfl: 3 .  levothyroxine (SYNTHROID, LEVOTHROID) 50 MCG tablet, Take 50 mcg by mouth daily before breakfast., Disp: , Rfl:  .  loperamide (IMODIUM A-D) 2 MG tablet, Take 2 mg by mouth 3 (three) times daily as needed for diarrhea or loose stools. , Disp: , Rfl:  .  metoprolol succinate (TOPROL-XL) 50 MG 24 hr tablet, Take 1 tablet (50 mg total) by mouth every morning., Disp: 90 tablet, Rfl: 3 .  mirtazapine (REMERON) 15 MG tablet, Take 1 tablet (15 mg total) by mouth at bedtime., Disp: 30 tablet, Rfl: 5 .  Multiple Vitamins-Minerals (MULTIVITAMIN ADULTS 50+ PO), Take 1 tablet by mouth every morning., Disp: , Rfl:  .  nepafenac (NEVANAC) 0.1 % ophthalmic  suspension, Place 1 drop into both eyes 3 (three) times daily as needed., Disp: , Rfl:  .  nitroGLYCERIN (NITROSTAT) 0.4 MG SL tablet, Place 1 tablet (0.4 mg total) under the tongue every 5 (five) minutes x 3 doses as needed for chest pain., Disp: 75 tablet, Rfl: 2 .  omeprazole (PRILOSEC) 40 MG capsule, Take 1 capsule (40 mg total) by mouth daily before breakfast., Disp: 90 capsule, Rfl: 0 .  Spacer/Aero-Holding Chambers (OPTICHAMBER ADVANTAGE) MISC, 1 each by Other route once., Disp:  1 each, Rfl: 0 .  tamsulosin (FLOMAX) 0.4 MG CAPS capsule, Take 0.4 mg by mouth., Disp: , Rfl:  .  torsemide (DEMADEX) 20 MG tablet, Take 20 mg by mouth daily., Disp: , Rfl:  .  warfarin (COUMADIN) 3 MG tablet, Take 1 tablet (3 mg total) by mouth at bedtime., Disp: 30 tablet, Rfl: 0 .  fluticasone furoate-vilanterol (BREO ELLIPTA) 100-25 MCG/INH AEPB, Inhale 1 puff into the lungs daily. (Patient not taking: Reported on 12/18/2017), Disp: 28 each, Rfl: 5 .  omeprazole (PRILOSEC) 40 MG capsule, Take by mouth., Disp: , Rfl:  .  ranitidine (ZANTAC) 300 MG tablet, Take 1 tablet (300 mg total) by mouth daily. (Patient not taking: Reported on 12/18/2017), Disp: 90 tablet, Rfl: 3   Family History  Problem Relation Age of Onset  . Brain cancer Father   . Diabetes Sister   . Heart attack Sister      Social History   Tobacco Use  . Smoking status: Former Smoker    Packs/day: 2.00    Years: 15.00    Pack years: 30.00    Types: Cigarettes  . Smokeless tobacco: Former Systems developer  . Tobacco comment: quit 1979  Substance Use Topics  . Alcohol use: No    Comment: previously drank heavily - quit 1979.  . Drug use: No    Allergies as of 12/18/2017 - Review Complete 12/18/2017  Allergen Reaction Noted  . Clopidogrel Anaphylaxis 07/06/2012  . Ciprofloxacin Itching 10/14/2012  . Spironolactone  03/29/2014    Review of Systems:    All systems reviewed and negative except where noted in HPI.   Physical Exam:  BP 113/72  (BP Location: Left Arm, Patient Position: Sitting, Cuff Size: Large)   Pulse 75   Resp 17   Ht 5\' 6"  (1.676 m)   Wt 196 lb 9.6 oz (89.2 kg)   BMI 31.73 kg/m  No LMP for male patient.  General:   Alert,  Well-developed, well-nourished, pleasant and cooperative in NAD Head:  Normocephalic and atraumatic. Eyes:  Sclera clear, no icterus.   Conjunctiva pink. Ears:  Normal auditory acuity. Nose:  No deformity, discharge, or lesions. Mouth:  No deformity or lesions,oropharynx pink & moist. Neck:  Supple; no masses or thyromegaly. Lungs:  Respirations even and unlabored.  Clear throughout to auscultation.   No wheezes, crackles, or rhonchi. No acute distress. Heart:  Regular rate and rhythm; no murmurs, clicks, rubs, or gallops. Abdomen:  Normal bowel sounds. Soft, non-tender and non-distended without masses, hepatosplenomegaly or hernias noted.  No guarding or rebound tenderness.   Rectal: Nor performed Msk:  Symmetrical without gross deformities. Good, equal movement & strength bilaterally. Pulses:  Normal pulses noted. Extremities:  No clubbing or edema.  No cyanosis. Neurologic:  Alert and oriented x3;  grossly normal neurologically. Skin:  Intact without significant lesions or rashes. No jaundice. Psych:  Alert and cooperative. Normal mood and affect.  Imaging Studies: X-ray upper GI 12/19/2016 FINDINGS: There was normal pharyngeal anatomy and motility. Contrast flowed freely through the esophagus without evidence of stricture or mass. There was normal esophageal mucosa without evidence of irregularity or ulceration. Tertiary contractions of the distal third of the esophagus as can be seen with esophageal spasm. No evidence of reflux. No definite hiatal hernia was demonstrated.  At the end of the examination a 13 mm barium tablet was administered which transited through the esophagus and esophagogastric junction without delay.  IMPRESSION: 1. Tertiary contractions of the  distal third of the  esophagus as can be seen with esophageal spasm. 2. No esophageal stricture.  Assessment and Plan:   Lucas Caldwell is a 84 y.o. male with coronary artery disease, congestive heart failure, atrial fibrillation on anticoagulation with brilinta and Coumadin, CML, [facility] hospitalization on 12/19/2016 with AKI and 3 months history of  dysphagia to solids and liquids.  EGD showed Nonobstructing Schatzki's ring which was empirically dilated to 81mm with balloon. The patient is able to maintain his weight. He continues to have symptoms of dysphagia  Chronic dysphagia: EGD revealed nonobstructing Schatzki's ring which was empirically dilated to 20 mm Esophageal manometry results are pending Continue ranitidine 300mg  as needed as patient reports it is helping with heartburn and regurgitation instead of omeprazole 40 mg  Early satiety: Patient has poorly controlled diabetes for a long time He may also have underlying gastroparesis Encouraged him to have small frequent meals, cut back on high carbohydrate diet Tight control of diabetes  Colon cancer screening: Colonoscopy in 2001 at [facility], reportedly normal and patient was told to undergo another colonoscopy in 10 years based on his overall medical condition and he has not pursued surveillance colonoscopy since 2001 I discussed with him about the overall risks and benefits of this procedure, different bowel prep formulations that are currently available.  I think it is reasonable for him to undergo colonoscopy given his overall medical condition.  He wishes to think about it and call my office if he decides to undergo  Intermittent nonbloody diarrhea associated with bloating: Probably osmotic nature with intake of artificial sweeteners and sweetened tea Advised him to avoid all artificial sweeteners I also discussed with him about the indication for colonoscopy to evaluate for microscopic colitis  Follow up in 1 month  Lucas Darby, MD

## 2017-12-22 ENCOUNTER — Ambulatory Visit (INDEPENDENT_AMBULATORY_CARE_PROVIDER_SITE_OTHER): Payer: Medicare HMO

## 2017-12-22 ENCOUNTER — Other Ambulatory Visit: Payer: Self-pay

## 2017-12-22 DIAGNOSIS — Z1211 Encounter for screening for malignant neoplasm of colon: Secondary | ICD-10-CM

## 2017-12-22 DIAGNOSIS — Z23 Encounter for immunization: Secondary | ICD-10-CM | POA: Diagnosis not present

## 2017-12-24 ENCOUNTER — Ambulatory Visit (INDEPENDENT_AMBULATORY_CARE_PROVIDER_SITE_OTHER): Payer: Medicare HMO

## 2017-12-24 DIAGNOSIS — I480 Paroxysmal atrial fibrillation: Secondary | ICD-10-CM | POA: Diagnosis not present

## 2017-12-24 DIAGNOSIS — Z7901 Long term (current) use of anticoagulants: Secondary | ICD-10-CM | POA: Diagnosis not present

## 2017-12-24 LAB — POCT INR: INR: 2.1 (ref 2.0–3.0)

## 2017-12-24 NOTE — Patient Instructions (Signed)
Please continue dosage of 1 TABLET EVERY DAY EXCEPT 1/2 TABLET ON MONDAYS & FRIDAYS. Recheck INR in 2 weeks.   *I will send a message to Dr. Saunders Revel & your GI doctor.  If they want you to hold your coumadin prior to your procedure, please take an extra 1/2 tablet for 2 days after they ok you to restart it.

## 2017-12-25 ENCOUNTER — Telehealth: Payer: Self-pay | Admitting: *Deleted

## 2017-12-25 ENCOUNTER — Other Ambulatory Visit: Payer: Self-pay | Admitting: *Deleted

## 2017-12-25 MED ORDER — WARFARIN SODIUM 3 MG PO TABS: 3.0000 mg | ORAL_TABLET | ORAL | 1 refills | Status: DC

## 2017-12-25 NOTE — Telephone Encounter (Signed)
Patient forgot to ask for refill for coumadin yesterday.  Routing to [facility] for refill. Thanks! :)

## 2017-12-25 NOTE — Telephone Encounter (Signed)
-----   Message from [name], MD sent at 12/25/2017  8:03 AM EDT ----- Regarding: RE: Colonoscopy 10/17 If GI feels that warfarin needs to be held for colonoscopy, I recommend stopping warfarin 4 days prior to procedure and starting aspirin 81 mg daily.  Aspirin should be continued in the perioperative period.  Warfarin should be restarted when it is felt safe to do so by Dr. Marius Ditch.  Aspirin can be discontinued when INR is therapeutic again.  Lucas Caldwell  ----- Message ----- From: Stana Bunting, RN Sent: 12/24/2017   4:35 PM EDT To: Vanessa Ralphs, RN, [name], MD Subject: Colonoscopy 10/17                              Dr. Saunders Revel,  [name] is sched for colonoscopy on 10/17 w Dr. Marius Ditch @ [facility]. He recently had EGD, but did not want colonoscopy at that time, as he "wanted to think about it for about 30 days" but his GI issues have worsened.  Dr. Marius Ditch mentioned in her note that pt is on coumadin, but she did not make a suggestion on holding it & it doesn't look like they have sought cardiac clearance to proceed. I advised Mr. Oyama that I would make you aware & have Anderson Malta call if you want him to hold coumadin prior to his procedure (since I'm out of the office until Monday).   Thank you in advance for looking out for him. [facility]

## 2017-12-25 NOTE — Telephone Encounter (Signed)
Called and s/w patient's wife. SHe has not heard from Dr Verlin Grills office if patient needs to hold coumadin or not prior to procedure. Encouraged patient to call and make sure. She verbalized understanding.  Routing this encounter to Dr Marius Ditch.

## 2017-12-29 ENCOUNTER — Telehealth: Payer: Self-pay | Admitting: Internal Medicine

## 2017-12-29 ENCOUNTER — Telehealth: Payer: Self-pay | Admitting: Gastroenterology

## 2017-12-29 NOTE — Telephone Encounter (Signed)
[name] Keesey(spouse) called about the patient stating she has questions and he scheduled  For Thursday for a colonoscopy. Please call [name] and advise([phone]).

## 2017-12-29 NOTE — Telephone Encounter (Signed)
S/w patient's wife, ok per DPR. She verbalized understanding to stay on aspirin now and until INR level is back in therapeutic range. She was very Patent attorney.

## 2017-12-29 NOTE — Telephone Encounter (Signed)
Pt wife is calling to see if pt needs to continue to take his aspirin before his colonoscopy on Thursday 10/17. Please call and advise.

## 2017-12-31 ENCOUNTER — Encounter: Payer: Self-pay | Admitting: Family Medicine

## 2017-12-31 ENCOUNTER — Ambulatory Visit (INDEPENDENT_AMBULATORY_CARE_PROVIDER_SITE_OTHER): Payer: Medicare HMO | Admitting: Family Medicine

## 2017-12-31 VITALS — BP 116/67 | HR 68 | Temp 98.0°F | Resp 16 | Ht 66.0 in | Wt 194.0 lb

## 2017-12-31 DIAGNOSIS — E1165 Type 2 diabetes mellitus with hyperglycemia: Secondary | ICD-10-CM | POA: Diagnosis not present

## 2017-12-31 DIAGNOSIS — E1121 Type 2 diabetes mellitus with diabetic nephropathy: Secondary | ICD-10-CM

## 2017-12-31 DIAGNOSIS — E1142 Type 2 diabetes mellitus with diabetic polyneuropathy: Secondary | ICD-10-CM | POA: Diagnosis not present

## 2017-12-31 DIAGNOSIS — IMO0002 Reserved for concepts with insufficient information to code with codable children: Secondary | ICD-10-CM

## 2017-12-31 MED ORDER — GABAPENTIN 300 MG PO CAPS: ORAL_CAPSULE | ORAL | 0 refills | Status: DC

## 2017-12-31 NOTE — Assessment & Plan Note (Signed)
Concern with chronic neuropathy, moderate to severe bilateral DM shoes Failed Lyrica in past Increase Gabapentin 300mg  caps take 2 AM / 1 afternoon / 2 PM - and titrate up by 1 cap q 3-7 days PRN tolerating, goal max dose up to 900mg  per dose (3caps) TID - new rx sent

## 2017-12-31 NOTE — Assessment & Plan Note (Signed)
See last A&P for full details, too soon for repeat A1c, scheduled in 1 month as planned. - Increase Gabapentin for neuropathy top dose 900 TID - Pending financial aid app for Trulicity

## 2017-12-31 NOTE — Patient Instructions (Addendum)
Thank you for coming to the office today.  Take Gabapentin 300mg  capsules -now ADD one extra pill in afternoon, and continue taking 2 capsules in AM and 2 in PM. - In future if improved, you may go ahead and increase further to TWO pills THREE times a day. - Eventually max dose is 3 pills 3 times a day.  NEW RX SENT TO PHARMACY  STOP Mirtazapine  Someone will call you soon within 1 week from the social worker office.  Memory test is fine, no new concerns at this time. They can check it once a year.  Keep apt in November for Diabetes.  Please schedule a Follow-up Appointment to: Return in about 27 days (around 01/27/2018) for keep scheduled apt for DM.  If you have any other questions or concerns, please feel free to call the office or send a message through [place]. You may also schedule an earlier appointment if necessary.  Additionally, you may be receiving a survey about your experience at our office within a few days to 1 week by e-mail or mail. We value your feedback.  [name], DO [place]

## 2017-12-31 NOTE — Progress Notes (Signed)
**Note Lucas-Identified via Obfuscation** Subjective:    Patient ID: Lucas Caldwell, male    DOB: 1934/01/21, 84 y.o.   MRN: [id number]  Lucas Caldwell is a 84 y.o. male presenting on 12/31/2017 for Diabetes  History provided by patient, and also by wife, Lucas Caldwell.  HPI   Anemia [name] scheduled for Colonoscopy tomorrow 01/01/18 with [facility] Dr Marius Ditch  Poor Appetite Previously started back on Mirtazapine 15mg  nightly for mood and reduced appetite, however he did not tolerate it well, seemed to give him abnormal dreams and upset stomach. Stopped after 1 dose. Seems to have some improved appetite, not worsening  CHRONIC DM, Type 2/ CKD-III Nephropathy and Neuropathy CBG 130-140s avg, he is doing well, last A1c >10,  Now next due in 01/2018, checking CBG Meds: [id number] udailyin AM after breakfast Completed Trulicity Application - awaiting meds for assistance to restart Trulicity 7.82, already used samples Reports good compliance. Tolerating well w/o side-effects Currently on ACEi Lifestyle: - Diet (still trying to improve diet, but not always adhering to low carb diet, drinks some water) - Exercise (limited by dyspnea) - Admits chronicDM Neuropathy with pain in feet bilateral,on Gabapentin with improvement, taking 300mg  x 2 pills twice a day - Admits improved edema now with compression / long socks - Denies hypoglycemia, nausea vomiting, fever chills, swelling, polyuria  Additional questions: - Asking about his MMSE score 28/30 last time 11/18/17 AMW  Health Maintenance: Already has UTD Flu vaccine   Depression screen [facility] 2/9 12/31/2017 11/18/2017 10/24/2017  Decreased Interest 0 0 0  Down, Depressed, Hopeless 0 0 0  PHQ - 2 Score 0 0 0    Social History   Tobacco Use  . Smoking status: Former Smoker    Packs/day: 2.00    Years: 15.00    Pack years: 30.00    Types: Cigarettes  . Smokeless tobacco: Former Systems developer  . Tobacco comment: quit 1979  Substance Use Topics  . Alcohol use: No    Comment: previously drank  heavily - quit 1979.  . Drug use: No    Review of Systems Per HPI unless specifically indicated above     Objective:    BP 116/67   Pulse 68   Temp 98 F (36.7 C)   Resp 16   Ht 5\' 6"  (1.676 m)   Wt 194 lb (88 kg)   BMI 31.31 kg/m   Wt Readings from Last 3 Encounters:  12/31/17 194 lb (88 kg)  12/18/17 196 lb 9.6 oz (89.2 kg)  12/10/17 189 lb 8 oz (86 kg)    Physical Exam  Constitutional: [name] oriented to person, place, and time. He appears well-developed and well-nourished. No distress.  Well appearing 84 year old elderly male, comfortable, cooperative, obese  Eyes: Right eye exhibits no discharge. Left eye exhibits no discharge.  Neck: Normal range of motion. Neck supple.  Cardiovascular: Normal rate, regular rhythm, normal heart sounds and intact distal pulses.  No murmur heard. Pulmonary/Chest: Effort normal and breath sounds normal. No respiratory distress. He has no wheezes. He has no rales.  Musculoskeletal: Normal range of motion. He exhibits no edema.  Has cane for ambulation assistance  Neurological: [name] alert and oriented to person, place, and time.  Skin: Skin is warm and dry. No rash noted. [name] not diaphoretic. No erythema.  Psychiatric: He has a normal mood and affect. His behavior is normal.  Well groomed, good eye contact, normal speech and thoughts  Nursing note and vitals reviewed.  Results  for orders placed or performed in visit on 12/10/17  POCT INR  Result Value Ref Range   INR 4.1 (A) 2.0 - 3.0  POCT INR  Result Value Ref Range   INR 2.1 2.0 - 3.0      Assessment & Plan:   Problem List Items Addressed This Visit    Diabetic neuropathy associated with type 2 diabetes mellitus ([place])    Concern with chronic neuropathy, moderate to severe bilateral DM shoes Failed Lyrica in past Increase Gabapentin 300mg  caps take 2 AM / 1 afternoon / 2 PM - and titrate up by 1 cap q 3-7 days PRN tolerating, goal max dose up to 900mg  per dose (3caps) TID  - new rx sent      Relevant Medications   gabapentin (NEURONTIN) 300 MG capsule   Uncontrolled type 2 diabetes mellitus with nephropathy ([place]) - Primary    See last A&P for full details, too soon for repeat A1c, scheduled in 1 month as planned. - Increase Gabapentin for neuropathy top dose 900 TID - Pending financial aid app for Trulicity         #Reduced Appetite Without worsening, seems stable. Failed Mirtazapine. Off now. Likely some secondary to GLP1 Trulicity  Meds ordered this encounter  Medications  . gabapentin (NEURONTIN) 300 MG capsule    Sig: Take 2 capsules in morning, 1 capsule in afternoon, 2 capsules at bedtime. In future increase up to max of 3 capsules 3 times daily    Dispense:  180 capsule    Refill:  0    Dose increase    Follow up plan: Return in about 27 days (around 01/27/2018) for keep scheduled apt for DM.  [name], [facility] 12/31/2017, 11:54 PM

## 2018-01-01 ENCOUNTER — Telehealth: Payer: Self-pay | Admitting: Family Medicine

## 2018-01-01 ENCOUNTER — Other Ambulatory Visit: Payer: Self-pay

## 2018-01-01 ENCOUNTER — Ambulatory Visit: Payer: Medicare HMO | Admitting: Anesthesiology

## 2018-01-01 ENCOUNTER — Ambulatory Visit
Admission: RE | Admit: 2018-01-01 | Discharge: 2018-01-01 | Disposition: A | Discharge status: Home / Self Care | Payer: Medicare HMO | Source: Ambulatory Visit | Attending: Gastroenterology | Admitting: Gastroenterology

## 2018-01-01 ENCOUNTER — Encounter
Admission: RE | Disposition: A | Discharge status: Home / Self Care | Payer: Self-pay | Source: Ambulatory Visit | Attending: Gastroenterology

## 2018-01-01 DIAGNOSIS — Z1211 Encounter for screening for malignant neoplasm of colon: Secondary | ICD-10-CM

## 2018-01-01 DIAGNOSIS — Z89411 Acquired absence of right great toe: Secondary | ICD-10-CM | POA: Insufficient documentation

## 2018-01-01 DIAGNOSIS — M1990 Unspecified osteoarthritis, unspecified site: Secondary | ICD-10-CM | POA: Diagnosis not present

## 2018-01-01 DIAGNOSIS — Z9841 Cataract extraction status, right eye: Secondary | ICD-10-CM | POA: Insufficient documentation

## 2018-01-01 DIAGNOSIS — K5730 Diverticulosis of large intestine without perforation or abscess without bleeding: Secondary | ICD-10-CM | POA: Diagnosis not present

## 2018-01-01 DIAGNOSIS — K5790 Diverticulosis of intestine, part unspecified, without perforation or abscess without bleeding: Secondary | ICD-10-CM | POA: Diagnosis not present

## 2018-01-01 DIAGNOSIS — Z8546 Personal history of malignant neoplasm of prostate: Secondary | ICD-10-CM | POA: Insufficient documentation

## 2018-01-01 DIAGNOSIS — Z955 Presence of coronary angioplasty implant and graft: Secondary | ICD-10-CM | POA: Insufficient documentation

## 2018-01-01 DIAGNOSIS — Z8249 Family history of ischemic heart disease and other diseases of the circulatory system: Secondary | ICD-10-CM | POA: Insufficient documentation

## 2018-01-01 DIAGNOSIS — M109 Gout, unspecified: Secondary | ICD-10-CM | POA: Insufficient documentation

## 2018-01-01 DIAGNOSIS — I130 Hypertensive heart and chronic kidney disease with heart failure and stage 1 through stage 4 chronic kidney disease, or unspecified chronic kidney disease: Secondary | ICD-10-CM | POA: Insufficient documentation

## 2018-01-01 DIAGNOSIS — K219 Gastro-esophageal reflux disease without esophagitis: Secondary | ICD-10-CM | POA: Insufficient documentation

## 2018-01-01 DIAGNOSIS — Z9842 Cataract extraction status, left eye: Secondary | ICD-10-CM | POA: Insufficient documentation

## 2018-01-01 DIAGNOSIS — N6489 Other specified disorders of breast: Secondary | ICD-10-CM | POA: Diagnosis not present

## 2018-01-01 DIAGNOSIS — Z79899 Other long term (current) drug therapy: Secondary | ICD-10-CM | POA: Insufficient documentation

## 2018-01-01 DIAGNOSIS — H5461 Unqualified visual loss, right eye, normal vision left eye: Secondary | ICD-10-CM | POA: Insufficient documentation

## 2018-01-01 DIAGNOSIS — J449 Chronic obstructive pulmonary disease, unspecified: Secondary | ICD-10-CM | POA: Insufficient documentation

## 2018-01-01 DIAGNOSIS — I428 Other cardiomyopathies: Secondary | ICD-10-CM | POA: Insufficient documentation

## 2018-01-01 DIAGNOSIS — D509 Iron deficiency anemia, unspecified: Secondary | ICD-10-CM | POA: Insufficient documentation

## 2018-01-01 DIAGNOSIS — Q2733 Arteriovenous malformation of digestive system vessel: Secondary | ICD-10-CM | POA: Diagnosis not present

## 2018-01-01 DIAGNOSIS — E785 Hyperlipidemia, unspecified: Secondary | ICD-10-CM | POA: Diagnosis not present

## 2018-01-01 DIAGNOSIS — I4510 Unspecified right bundle-branch block: Secondary | ICD-10-CM | POA: Insufficient documentation

## 2018-01-01 DIAGNOSIS — Z7901 Long term (current) use of anticoagulants: Secondary | ICD-10-CM | POA: Diagnosis not present

## 2018-01-01 DIAGNOSIS — I5042 Chronic combined systolic (congestive) and diastolic (congestive) heart failure: Secondary | ICD-10-CM | POA: Insufficient documentation

## 2018-01-01 DIAGNOSIS — R51 Headache: Secondary | ICD-10-CM | POA: Insufficient documentation

## 2018-01-01 DIAGNOSIS — Z794 Long term (current) use of insulin: Secondary | ICD-10-CM | POA: Insufficient documentation

## 2018-01-01 DIAGNOSIS — Z808 Family history of malignant neoplasm of other organs or systems: Secondary | ICD-10-CM | POA: Insufficient documentation

## 2018-01-01 DIAGNOSIS — I2510 Atherosclerotic heart disease of native coronary artery without angina pectoris: Secondary | ICD-10-CM | POA: Insufficient documentation

## 2018-01-01 DIAGNOSIS — N184 Chronic kidney disease, stage 4 (severe): Secondary | ICD-10-CM | POA: Diagnosis not present

## 2018-01-01 DIAGNOSIS — Z888 Allergy status to other drugs, medicaments and biological substances status: Secondary | ICD-10-CM | POA: Insufficient documentation

## 2018-01-01 DIAGNOSIS — E1122 Type 2 diabetes mellitus with diabetic chronic kidney disease: Secondary | ICD-10-CM | POA: Insufficient documentation

## 2018-01-01 DIAGNOSIS — N400 Enlarged prostate without lower urinary tract symptoms: Secondary | ICD-10-CM | POA: Diagnosis not present

## 2018-01-01 DIAGNOSIS — Z833 Family history of diabetes mellitus: Secondary | ICD-10-CM | POA: Insufficient documentation

## 2018-01-01 DIAGNOSIS — Z881 Allergy status to other antibiotic agents status: Secondary | ICD-10-CM | POA: Insufficient documentation

## 2018-01-01 DIAGNOSIS — D631 Anemia in chronic kidney disease: Secondary | ICD-10-CM | POA: Diagnosis not present

## 2018-01-01 DIAGNOSIS — Z87891 Personal history of nicotine dependence: Secondary | ICD-10-CM | POA: Insufficient documentation

## 2018-01-01 DIAGNOSIS — Z7982 Long term (current) use of aspirin: Secondary | ICD-10-CM | POA: Insufficient documentation

## 2018-01-01 DIAGNOSIS — K5520 Angiodysplasia of colon without hemorrhage: Secondary | ICD-10-CM | POA: Diagnosis not present

## 2018-01-01 DIAGNOSIS — C9210 Chronic myeloid leukemia, BCR/ABL-positive, not having achieved remission: Secondary | ICD-10-CM | POA: Insufficient documentation

## 2018-01-01 DIAGNOSIS — E039 Hypothyroidism, unspecified: Secondary | ICD-10-CM | POA: Insufficient documentation

## 2018-01-01 DIAGNOSIS — Z7951 Long term (current) use of inhaled steroids: Secondary | ICD-10-CM | POA: Insufficient documentation

## 2018-01-01 DIAGNOSIS — I480 Paroxysmal atrial fibrillation: Secondary | ICD-10-CM | POA: Diagnosis not present

## 2018-01-01 HISTORY — PX: COLONOSCOPY WITH PROPOFOL: SHX5780

## 2018-01-01 LAB — GLUCOSE, CAPILLARY: Glucose-Capillary: 151 mg/dL — ABNORMAL HIGH (ref 70–99)

## 2018-01-01 SURGERY — COLONOSCOPY WITH PROPOFOL
Anesthesia: General

## 2018-01-01 MED ORDER — PHENYLEPHRINE HCL 10 MG/ML IJ SOLN
INTRAMUSCULAR | Status: AC
  Filled 2018-01-01: qty 1

## 2018-01-01 MED ORDER — SODIUM CHLORIDE 0.9 % IV SOLN
INTRAVENOUS | Status: DC
  Administered 2018-01-01: 09:00:00 via INTRAVENOUS

## 2018-01-01 MED ORDER — PROPOFOL 10 MG/ML IV BOLUS
INTRAVENOUS | Status: DC | PRN
  Administered 2018-01-01: 90 mg via INTRAVENOUS

## 2018-01-01 MED ORDER — PROPOFOL 500 MG/50ML IV EMUL
INTRAVENOUS | Status: DC | PRN
  Administered 2018-01-01: 140 ug/kg/min via INTRAVENOUS

## 2018-01-01 MED ORDER — PROPOFOL 10 MG/ML IV BOLUS
INTRAVENOUS | Status: AC
  Filled 2018-01-01: qty 20

## 2018-01-01 MED ORDER — PROPOFOL 500 MG/50ML IV EMUL
INTRAVENOUS | Status: AC
  Filled 2018-01-01: qty 50

## 2018-01-01 NOTE — Anesthesia Post-op Follow-up Note (Signed)
Anesthesia QCDR form completed.        

## 2018-01-01 NOTE — Anesthesia Preprocedure Evaluation (Signed)
Anesthesia Evaluation  Patient identified by MRN, date of birth, ID band Patient awake    Reviewed: Allergy & Precautions, H&P , NPO status , Patient's Chart, lab work & pertinent test results  History of Anesthesia Complications Negative for: history of anesthetic complications  Airway Mallampati: III  TM Distance: >3 FB Neck ROM: full    Dental  (+) Chipped, Poor Dentition, Missing   Pulmonary shortness of breath and with exertion, COPD, former smoker,           Cardiovascular hypertension, (-) angina+ CAD, + Cardiac Stents and +CHF  + dysrhythmias      Neuro/Psych  Headaches, negative psych ROS   GI/Hepatic Neg liver ROS, GERD  ,  Endo/Other  diabetes, Type 2Hypothyroidism   Renal/GU Renal disease  negative genitourinary   Musculoskeletal  (+) Arthritis ,   Abdominal   Peds  Hematology negative hematology ROS (+)   Anesthesia Other Findings Past Medical History: No date: Arthritis     Comment:  "probably in his legs" (08/15/2016) 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 No date: CAD (coronary artery disease)     Comment:  a. Cath in the late 90's - reportedly ok;  b. 2014 s/p               stenting x 2 @ [facility]; c 08/19/16 Cath/PCI with DES -> RCA,               plan to treat LM 70% medically. Seen by surgery and felt               to be too high risk for CABG. No date: Chronic combined systolic (congestive) and diastolic  (congestive) heart failure (HCC)     Comment:  a. Previously reduced EF-->50% by echo in 2012;  b.               06/2015 Echo: EF 50-55%  c. 07/2016 Echo: EF 45-50%. No date: Chronic kidney disease (CKD), stage IV (severe) (HCC) No date: CML (chronic myelocytic leukemia) (HCC) No date: GERD (gastroesophageal reflux disease) No date: Gout No date: Hyperlipemia No date:  Hypertension No date: Hypothyroidism No date: Iron deficiency anemia No date: Migraine     Comment:  "in the 47s" (08/15/2016) No date: NICM (nonischemic cardiomyopathy) ([place])     Comment:  a. Previously reduced EF-->50% by echo in 2012;  b.               06/2015 Echo: EF 50-55%, Gr2 DD, mild MR, mildly dil LA,               Ao sclerosis, mild TR;  c. 07/2016 Echo: EF 45-50%, ? inf               HK, mild MR, mildly dil LA. No date: PAF (paroxysmal atrial fibrillation) (HCC)     Comment:  a. ? Dx 2014-->s/p DCCV;  b. CHA2DS2VASc = 6-->chronic               coumadin. No date: Prostate cancer ([place]) No date: RBBB No date: Type II diabetes mellitus ([place])  Past Surgical History: No date: CATARACT EXTRACTION W/ INTRAOCULAR LENS  IMPLANT, BILATERAL;  Bilateral 09/2012: CORONARY ANGIOPLASTY WITH STENT PLACEMENT     Comment:  2 stents 08/19/2016: CORONARY STENT INTERVENTION; N/A     Comment:  Procedure: Coronary Stent  Intervention;  Surgeon: [name], MD;  Location: [place] [facility];  Service:              Cardiovascular;  Laterality: N/A; 12/16/2017: ESOPHAGEAL MANOMETRY; N/A     Comment:  Procedure: ESOPHAGEAL MANOMETRY (EM);  Surgeon: Lin Landsman, MD;  Location: [facility] ENDOSCOPY;  Service:               Gastroenterology;  Laterality: N/A; 12/20/2016: ESOPHAGOGASTRODUODENOSCOPY; N/A     Comment:  Procedure: ESOPHAGOGASTRODUODENOSCOPY (EGD);  Surgeon:               Lin Landsman, MD;  Location: [facility] ENDOSCOPY;                Service: Gastroenterology;  Laterality: N/A; 11/03/2017: ESOPHAGOGASTRODUODENOSCOPY (EGD) WITH PROPOFOL; N/A     Comment:  Procedure: ESOPHAGOGASTRODUODENOSCOPY (EGD) WITH               PROPOFOL;  Surgeon: Lin Landsman, MD;  Location:               [facility] ENDOSCOPY;  Service: Gastroenterology;  Laterality:               N/A; No date: EYE SURGERY No date: HERNIA REPAIR     Comment:  "navel" 08/14/2016: LEFT HEART  CATH AND CORONARY ANGIOGRAPHY; N/A     Comment:  Procedure: Left Heart Cath and Coronary Angiography;                Surgeon: [name], MD;  Location: [place]               CV LAB;  Service: Cardiovascular;  Laterality: N/A; No date: TOE AMPUTATION; Right     Comment:  "big toe"  BMI    Body Mass Index:  33.13 kg/m      Reproductive/Obstetrics negative OB ROS                             Anesthesia Physical Anesthesia Plan  ASA: III  Anesthesia Plan: General   Post-op Pain Management:    Induction: Intravenous  PONV Risk Score and Plan: Propofol infusion and TIVA  Airway Management Planned: Natural Airway and Nasal Cannula  Additional Equipment:   Intra-op Plan:   Post-operative Plan:   Informed Consent: I have reviewed the patients History and Physical, chart, labs and discussed the procedure including the risks, benefits and alternatives for the proposed anesthesia with the patient or authorized representative who has indicated his/her understanding and acceptance.   Dental Advisory Given  Plan Discussed with: Anesthesiologist, CRNA and Surgeon  Anesthesia Plan Comments: (Patient consented for risks of anesthesia including but not limited to:  - adverse reactions to medications - risk of intubation if required - damage to teeth, lips or other oral mucosa - sore throat or hoarseness - Damage to heart, brain, lungs or loss of life  Patient voiced understanding.)        Anesthesia Quick Evaluation

## 2018-01-01 NOTE — Transfer of Care (Signed)
Immediate Anesthesia Transfer of Care Note  Patient: Lucas Caldwell  Procedure(s) Performed: COLONOSCOPY WITH PROPOFOL (N/A )  Patient Location: Endoscopy Unit  Anesthesia Type:General  Level of Consciousness: sedated  Airway & Oxygen Therapy: Patient Spontanous Breathing and Patient connected to nasal cannula oxygen  Post-op Assessment: Report given to RN and Post -op Vital signs reviewed and stable  Post vital signs: Reviewed and stable  Last Vitals:  Vitals Value Taken Time  BP    Temp    Pulse    Resp    SpO2 98 % 01/01/2018  9:39 AM    Last Pain:  Vitals:   01/01/18 0857  TempSrc:   PainSc: 0-No pain         Complications: No apparent anesthesia complications

## 2018-01-01 NOTE — H&P (Signed)
[name], MD [address]  [place]  [place], [place] [address]  Main: [phone]  Fax: [phone] Pager: [phone]  Primary Care Physician:  [name], DO Primary Gastroenterologist:  Dr. [name]  Pre-Procedure History & Physical: HPI:  Lucas Caldwell is a 84 y.o. male is here for an colonoscopy.   Past Medical History:  Diagnosis Date  . Arthritis    "probably in his legs" (08/15/2016)  . Blind right eye   . BPH (benign prostatic hyperplasia)   . Breast asymmetry    Left breast is larger, present for several years.  [name] CAD (coronary artery disease)    a. Cath in the late 90's - reportedly ok;  b. 2014 s/p stenting x 2 @ [facility]; c 08/19/16 Cath/PCI with DES -> RCA, plan to treat LM 70% medically. Seen by surgery and felt to be too high risk for CABG.  . Chronic combined systolic (congestive) and diastolic (congestive) heart failure ([place])    a. Previously reduced EF-->50% by echo in 2012;  b. 06/2015 Echo: EF 50-55%  c. 07/2016 Echo: EF 45-50%.  . Chronic kidney disease (CKD), stage IV (severe) ([place])   . CML (chronic myelocytic leukemia) ([place])   . GERD (gastroesophageal reflux disease)   . Gout   . Hyperlipemia   . Hypertension   . Hypothyroidism   . Iron deficiency anemia   . Migraine    "in the 1960s" (08/15/2016)  . NICM (nonischemic cardiomyopathy) ([place])    a. Previously reduced EF-->50% by echo in 2012;  b. 06/2015 Echo: EF 50-55%, Gr2 DD, mild MR, mildly dil LA, Ao sclerosis, mild TR;  c. 07/2016 Echo: EF 45-50%, ? inf HK, mild MR, mildly dil LA.  [name] PAF (paroxysmal atrial fibrillation) (HCC)    a. ? Dx 2014-->s/p DCCV;  b. CHA2DS2VASc = 6-->chronic coumadin.  . Prostate cancer ([place])   . RBBB   . Type II diabetes mellitus ([place])     Past Surgical History:  Procedure Laterality Date  . CATARACT EXTRACTION W/ INTRAOCULAR LENS  IMPLANT, BILATERAL Bilateral   . CORONARY ANGIOPLASTY WITH STENT PLACEMENT  09/2012   2 stents  . CORONARY  STENT INTERVENTION N/A 08/19/2016   Procedure: Coronary Stent Intervention;  Surgeon: [name], MD;  Location: [place] [facility];  Service: Cardiovascular;  Laterality: N/A;  . ESOPHAGEAL MANOMETRY N/A 12/16/2017   Procedure: ESOPHAGEAL MANOMETRY (EM);  Surgeon: Lin Landsman, MD;  Location: [facility] ENDOSCOPY;  Service: Gastroenterology;  Laterality: N/A;  . ESOPHAGOGASTRODUODENOSCOPY N/A 12/20/2016   Procedure: ESOPHAGOGASTRODUODENOSCOPY (EGD);  Surgeon: Lin Landsman, MD;  Location: [facility] ENDOSCOPY;  Service: Gastroenterology;  Laterality: N/A;  . ESOPHAGOGASTRODUODENOSCOPY (EGD) WITH PROPOFOL N/A 11/03/2017   Procedure: ESOPHAGOGASTRODUODENOSCOPY (EGD) WITH PROPOFOL;  Surgeon: Lin Landsman, MD;  Location: [facility] ENDOSCOPY;  Service: Gastroenterology;  Laterality: N/A;  . EYE SURGERY    . HERNIA REPAIR     "navel"  . LEFT HEART CATH AND CORONARY ANGIOGRAPHY N/A 08/14/2016   Procedure: Left Heart Cath and Coronary Angiography;  Surgeon: [name], MD;  Location: [place] [facility];  Service: Cardiovascular;  Laterality: N/A;  . TOE AMPUTATION Right    "big toe"    Prior to Admission medications   Medication Sig Start Date End Date Taking? Authorizing Provider  acetaminophen (TYLENOL) 500 MG tablet Take 500 mg by mouth every 6 (six) hours as needed.   Yes [provider]  Alcohol Swabs 70 % PADS Use when checking  blood sugar up to 3x daily 04/09/17  Yes Karamalegos, [name], DO  allopurinol (ZYLOPRIM) 100 MG tablet Take 100 mg by mouth daily. 02/03/17  Yes [provider]  aspirin EC 81 MG tablet Take 1 tablet (81 mg total) by mouth daily. 09/02/17  Yes Theora Gianotti, NP  atorvastatin (LIPITOR) 80 MG tablet Take 1 tablet (80 mg total) by mouth daily. 09/24/17  Yes Karamalegos, Alexander J, DO  atropine 1 % ophthalmic solution Apply to eye.   Yes [provider]  gabapentin (NEURONTIN) 300 MG capsule Take 2 capsules in morning,  1 capsule in afternoon, 2 capsules at bedtime. In future increase up to max of 3 capsules 3 times daily 12/31/17  Yes Karamalegos, [name], DO  omeprazole (PRILOSEC) 40 MG capsule Take by mouth.   Yes [provider]  albuterol (PROVENTIL HFA;VENTOLIN HFA) 108 (90 Base) MCG/ACT inhaler Inhale 2 puffs into the lungs every 6 (six) hours as needed for wheezing or shortness of breath. 05/13/17   Theora Gianotti, NP  bosutinib (BOSULIF) 100 MG tablet Take 200 mg by mouth at bedtime. Take with food.     [provider]  Dulaglutide (TRULICITY) 0.35 KK/9.3GH SOPN Inject 0.75 mg into the skin once a week. 10/31/17   Karamalegos, [name], DO  enalapril (VASOTEC) 5 MG tablet Take 1 tablet (5 mg total) by mouth daily. 08/06/17   Karamalegos, [name], DO  fenofibrate 160 MG tablet Take 1 tablet (160 mg total) by mouth daily. 09/24/17   Karamalegos, Alexander J, DO  fluticasone furoate-vilanterol (BREO ELLIPTA) 100-25 MCG/INH AEPB Inhale 1 puff into the lungs daily. 06/13/17   Karamalegos, [name], DO  Insulin Pen Needle 31G X 5 MM MISC 31 g by Does not apply route as directed. 04/16/17   [name], NP  isosorbide mononitrate (IMDUR) 60 MG 24 hr tablet Take 1 tablet (60 mg total) by mouth daily. 03/25/17   End, [name], MD  levothyroxine (SYNTHROID, LEVOTHROID) 50 MCG tablet Take 50 mcg by mouth daily before breakfast.    [provider]  loperamide (IMODIUM A-D) 2 MG tablet Take 2 mg by mouth 3 (three) times daily as needed for diarrhea or loose stools.     [provider]  metoprolol succinate (TOPROL-XL) 50 MG 24 hr tablet Take 1 tablet (50 mg total) by mouth every morning. 06/24/17   Karamalegos, [name], DO  mirtazapine (REMERON) 15 MG tablet Take 1 tablet (15 mg total) by mouth at bedtime. Patient not taking: Reported on 12/31/2017 12/08/17   [name], DO  Multiple Vitamins-Minerals (MULTIVITAMIN ADULTS 50+ PO) Take 1  tablet by mouth every morning.    [provider]  nepafenac (NEVANAC) 0.1 % ophthalmic suspension Place 1 drop into both eyes 3 (three) times daily as needed.    [provider]  nitroGLYCERIN (NITROSTAT) 0.4 MG SL tablet Place 1 tablet (0.4 mg total) under the tongue every 5 (five) minutes x 3 doses as needed for chest pain. 10/02/16   End, [name], MD  omeprazole (PRILOSEC) 40 MG capsule Take 1 capsule (40 mg total) by mouth daily before breakfast. Patient not taking: Reported on 12/31/2017 11/03/17 02/01/18  Lin Landsman, MD  ranitidine (ZANTAC) 300 MG tablet Take 1 tablet (300 mg total) by mouth daily. 07/22/17   Karamalegos, [name], DO  Spacer/Aero-Holding Chambers [facility] ADVANTAGE) MISC 1 each by Other route once. 11/04/14   Ahmed Prima, MD  tamsulosin (FLOMAX) 0.4 MG  CAPS capsule Take 0.4 mg by mouth.    [provider]  torsemide (DEMADEX) 20 MG tablet Take 20 mg by mouth daily.    [provider]  warfarin (COUMADIN) 3 MG tablet Take 1 tablet (3 mg total) by mouth as directed. 12/25/17   [name], MD    Allergies as of 12/22/2017 - Review Complete 12/18/2017  Allergen Reaction Noted  . Clopidogrel Anaphylaxis 07/06/2012  . Ciprofloxacin Itching 10/14/2012  . Spironolactone  03/29/2014    Family History  Problem Relation Age of Onset  . Brain cancer Father   . Diabetes Sister   . Heart attack Sister     Social History   Socioeconomic History  . Marital status: Married    Spouse name: Not on file  . Number of children: Not on file  . Years of education: Not on file  . Highest education level: 7th grade  Occupational History  . Occupation: Retired    Comment: Owned his own [employer].  Social Needs  . Financial resource strain: Hard  . Food insecurity:    Worry: Sometimes true    Inability: Never true  . Transportation needs:    Medical: No    Non-medical: No  Tobacco Use  . Smoking status:  Former Smoker    Packs/day: 2.00    Years: 15.00    Pack years: 30.00    Types: Cigarettes  . Smokeless tobacco: Former Systems developer  . Tobacco comment: quit 1979  Substance and Sexual Activity  . Alcohol use: No    Comment: previously drank heavily - quit 1979.  . Drug use: No  . Sexual activity: Never  Lifestyle  . Physical activity:    Days per week: 0 days    Minutes per session: 0 min  . Stress: Not at all  Relationships  . Social connections:    Talks on phone: More than three times a week    Gets together: More than three times a week    Attends religious service: More than 4 times per year    Active member of club or organization: No    Attends meetings of clubs or organizations: Never    Relationship status: Married  . Intimate partner violence:    Fear of current or ex partner: No    Emotionally abused: No    Physically abused: No    Forced sexual activity: No  Other Topics Concern  . Not on file  Social History Narrative   Working on transportation truck part time     Review of Systems: See HPI, otherwise negative ROS  Physical Exam: BP 113/77   Pulse 90   Temp (!) 96.9 F (36.1 C) (Tympanic)   Ht 5\' 4"  (1.626 m)   Wt 87.5 kg   SpO2 100%   BMI 33.13 kg/m  General:   Alert,  pleasant and cooperative in NAD Head:  Normocephalic and atraumatic. Neck:  Supple; no masses or thyromegaly. Lungs:  Clear throughout to auscultation.    Heart:  Regular rate and rhythm. Abdomen:  Soft, nontender and nondistended. Normal bowel sounds, without guarding, and without rebound.   Neurologic:  Alert and  oriented x4;  grossly normal neurologically.  Impression/Plan: Lucas Caldwell is here for an colonoscopy to be performed for colon cancer screening  Risks, benefits, limitations, and alternatives regarding  colonoscopy have been reviewed with the patient.  Questions have been answered.  All parties agreeable.   Sherri Sear, MD  01/01/2018, 9:16 AM

## 2018-01-01 NOTE — Anesthesia Postprocedure Evaluation (Signed)
Anesthesia Post Note  Patient: Lucas Caldwell  Procedure(s) Performed: COLONOSCOPY WITH PROPOFOL (N/A )  Patient location during evaluation: Endoscopy Anesthesia Type: General Level of consciousness: awake and alert Pain management: pain level controlled Vital Signs Assessment: post-procedure vital signs reviewed and stable Respiratory status: spontaneous breathing, nonlabored ventilation, respiratory function stable and patient connected to nasal cannula oxygen Cardiovascular status: blood pressure returned to baseline and stable Postop Assessment: no apparent nausea or vomiting Anesthetic complications: no     Last Vitals:  Vitals:   01/01/18 0959 01/01/18 1009  BP: 117/73 100/75  Pulse: 77 72  Resp: 12 15  Temp:    SpO2: 100% 98%    Last Pain:  Vitals:   01/01/18 1009  TempSrc:   PainSc: 0-No pain                 Precious Haws Mayte Diers

## 2018-01-01 NOTE — Telephone Encounter (Signed)
Called Pt to discuss Lucas Caldwell Referral. c/b # [phone] [name]

## 2018-01-01 NOTE — Op Note (Signed)
[facility] Patient Name: Lucas Caldwell Procedure Date: 01/01/2018 9:13 AM MRN: [id number] Account #: [id number] Date of Birth: 05-30-1933 Admit Type: Outpatient Age: 84 Room: [facility] ENDO ROOM 2 Gender: Male Note Status: Finalized Procedure:            Colonoscopy Indications:          Screening for colorectal malignant neoplasm, , last                        colonoscopy for screening in 2001 at [facility], normal Providers:            Lin Landsman MD, MD Referring MD:         [name] (Referring MD) Medicines:            Monitored Anesthesia Care Complications:        No immediate complications. Estimated blood loss: None. Procedure:            Pre-Anesthesia Assessment:                       - Prior to the procedure, a History and Physical was                        performed, and patient medications and allergies were                        reviewed. The patient is competent. The risks and                        benefits of the procedure and the sedation options and                        risks were discussed with the patient. All questions                        were answered and informed consent was obtained.                        Patient identification and proposed procedure were                        verified by the physician, the nurse, the                        anesthesiologist, the anesthetist and the technician in                        the pre-procedure area in the procedure room in the                        endoscopy suite. Mental Status Examination: alert and                        oriented. Airway Examination: normal oropharyngeal                        airway and neck mobility. Respiratory Examination:                        clear to auscultation. CV Examination: normal.  Prophylactic Antibiotics: The patient does not require                        prophylactic antibiotics. Prior  Anticoagulants: The                        patient has taken Coumadin (warfarin), last dose was 4                        days prior to procedure. ASA Grade Assessment: III - A                        patient with severe systemic disease. After reviewing                        the risks and benefits, the patient was deemed in                        satisfactory condition to undergo the procedure. The                        anesthesia plan was to use monitored anesthesia care                        (MAC). Immediately prior to administration of                        medications, the patient was re-assessed for adequacy                        to receive sedatives. The heart rate, respiratory rate,                        oxygen saturations, blood pressure, adequacy of                        pulmonary ventilation, and response to care were                        monitored throughout the procedure. The physical status                        of the patient was re-assessed after the procedure.                       After obtaining informed consent, the colonoscope was                        passed under direct vision. Throughout the procedure,                        the patient's blood pressure, pulse, and oxygen                        saturations were monitored continuously. The                        Colonoscope was introduced through the anus and  advanced to the the cecum, identified by appendiceal                        orifice and ileocecal valve. The colonoscopy was                        performed without difficulty. The patient tolerated the                        procedure well. The quality of the bowel preparation                        was evaluated using the BBPS [facility] Bowel Preparation                        Scale) with scores of: Right Colon = 3, Transverse                        Colon = 3 and Left Colon = 3 (entire mucosa seen well                        with  no residual staining, small fragments of stool or                        opaque liquid). The total BBPS score equals 9. Findings:      The perianal and digital rectal examinations were normal. Pertinent       negatives include normal sphincter tone and no palpable rectal lesions.      A single medium-sized angioectasia without bleeding was found in the       proximal ascending colon. Fulguration to ablate the lesion to prevent       bleeding by argon plasma at 0.8 liters/minute and 20 watts was       successful. Estimated blood loss: none.      Multiple small and large-mouthed diverticula were found in the sigmoid       colon and descending colon.      The retroflexed view of the distal rectum and anal verge was normal and       showed no anal or rectal abnormalities.      The exam was otherwise without abnormality. Impression:           - A single non-bleeding colonic angioectasia. Treated                        with argon plasma coagulation (APC).                       - Diverticulosis in the sigmoid colon and in the                        descending colon.                       - The distal rectum and anal verge are normal on                        retroflexion view.                       - The  examination was otherwise normal.                       - No specimens collected. Recommendation:       - Discharge patient to home (with escort).                       - Cardiac diet today.                       - Continue present medications.                       - Resume Coumadin (warfarin) today at prior dose. Refer                        to primary physician for further adjustment of therapy.                       - Do not recommend further surveillance colonoscopy for                        colon cancer screening Procedure Code(s):    --- Professional ---                       [phone], Colonoscopy, flexible; with control of bleeding,                        any method Diagnosis Code(s):     --- Professional ---                       Z12.11, Encounter for screening for malignant neoplasm                        of colon                       K55.20, Angiodysplasia of colon without hemorrhage                       K57.30, Diverticulosis of large intestine without                        perforation or abscess without bleeding CPT copyright 2018 [employer]. All rights reserved. The codes documented in this report are preliminary and upon coder review may  be revised to meet current compliance requirements. Dr. Ulyess Mort Lin Landsman MD, MD 01/01/2018 9:40:25 AM This report has been signed electronically. Number of Addenda: 0 Note Initiated On: 01/01/2018 9:13 AM Scope Withdrawal Time: 0 hours 9 minutes 14 seconds  Total Procedure Duration: 0 hours 12 minutes 39 seconds       [facility]

## 2018-01-05 ENCOUNTER — Telehealth: Payer: Self-pay | Admitting: Gastroenterology

## 2018-01-05 NOTE — Telephone Encounter (Signed)
Pt wife is calling for Dr. Marius Ditch nurse please call pt

## 2018-01-05 NOTE — Telephone Encounter (Signed)
Pt  Wife left vm she needs to speak to  Dr. Lance Morin nurse please call

## 2018-01-05 NOTE — Telephone Encounter (Signed)
[name] called and just want ed you to have her cell # incase you call her home and she is out . Call cell at [phone].

## 2018-01-06 NOTE — Telephone Encounter (Signed)
[name] called and states that he is unable to digest food and is currently experiencing diarrhea, wife is concerned and she states things have not changed since procedure, please advise

## 2018-01-06 NOTE — Telephone Encounter (Signed)
Pt has been notified and verbalized understanding, pt's wife will pick up samples tomorrow morning

## 2018-01-07 ENCOUNTER — Ambulatory Visit (INDEPENDENT_AMBULATORY_CARE_PROVIDER_SITE_OTHER): Payer: Medicare HMO

## 2018-01-07 DIAGNOSIS — Z7901 Long term (current) use of anticoagulants: Secondary | ICD-10-CM

## 2018-01-07 DIAGNOSIS — I480 Paroxysmal atrial fibrillation: Secondary | ICD-10-CM

## 2018-01-07 LAB — POCT INR: INR: 1.5 — AB (ref 2.0–3.0)

## 2018-01-07 NOTE — Patient Instructions (Signed)
Please take extra tablet tonight continue dosage of 1 TABLET EVERY DAY EXCEPT 1/2 TABLET ON MONDAYS & FRIDAYS. Recheck INR in 2 weeks.

## 2018-01-09 ENCOUNTER — Telehealth: Payer: Self-pay | Admitting: Family Medicine

## 2018-01-09 NOTE — Telephone Encounter (Signed)
2nd attempt knb

## 2018-01-09 NOTE — Telephone Encounter (Signed)
Discussed with Frederich Cha CMA as documented below. She spoke with patient's spouse today.  I checked our sample log, we have already given him 6 weeks worth of Trulicity 0.75mg  sample thus far, last was given on 12/22/17 for 2 week supply.  Since his application was completed and accepted and already reportedly shipped. I still recommend that he should be able to start the medicine that was ordered for him soon and hopefully in a timely manner. That is why I have not recommended continuing to give samples.  I would agree that if his shipment is not here by end of next week, then we may agree to give one more 2 week sample to bridge him until his shipment arrives.  It is safe to be off of this medicine for a few days or week if needed.  I attempted to call patient/spouse today after hours, Fri 10/25 at 6pm but unable to reach them, did not leave another voicemail.  [name], DO [facility] 01/09/2018, 6:04 PM

## 2018-01-09 NOTE — Telephone Encounter (Signed)
Patient's spouse called inquiring about receiving Trulicity medication. Advised her that we didn't receive shipment for his medication and it's okay per Dr. Raliegh Ip to wait 2 weeks without medication if we don't receive shipment by 2 weeks will provide more sample. Trulicity was approved by patient assistant program.

## 2018-01-10 DIAGNOSIS — K625 Hemorrhage of anus and rectum: Secondary | ICD-10-CM | POA: Diagnosis not present

## 2018-01-10 DIAGNOSIS — R55 Syncope and collapse: Secondary | ICD-10-CM | POA: Diagnosis not present

## 2018-01-11 ENCOUNTER — Encounter: Payer: Self-pay | Admitting: Internal Medicine

## 2018-01-11 ENCOUNTER — Inpatient Hospital Stay: Payer: Self-pay

## 2018-01-11 ENCOUNTER — Inpatient Hospital Stay: Payer: Medicare HMO

## 2018-01-11 ENCOUNTER — Inpatient Hospital Stay
Admission: EM | Admit: 2018-01-11 | Discharge: 2018-01-14 | DRG: 377 | Disposition: A | Discharge status: Home / Self Care | Payer: Medicare HMO | Attending: Internal Medicine | Admitting: Internal Medicine

## 2018-01-11 ENCOUNTER — Other Ambulatory Visit: Payer: Self-pay

## 2018-01-11 DIAGNOSIS — E8881 Metabolic syndrome: Secondary | ICD-10-CM | POA: Diagnosis present

## 2018-01-11 DIAGNOSIS — Z9841 Cataract extraction status, right eye: Secondary | ICD-10-CM

## 2018-01-11 DIAGNOSIS — K254 Chronic or unspecified gastric ulcer with hemorrhage: Secondary | ICD-10-CM | POA: Diagnosis present

## 2018-01-11 DIAGNOSIS — K633 Ulcer of intestine: Secondary | ICD-10-CM | POA: Diagnosis not present

## 2018-01-11 DIAGNOSIS — K625 Hemorrhage of anus and rectum: Secondary | ICD-10-CM | POA: Diagnosis not present

## 2018-01-11 DIAGNOSIS — T45515A Adverse effect of anticoagulants, initial encounter: Secondary | ICD-10-CM | POA: Diagnosis present

## 2018-01-11 DIAGNOSIS — D62 Acute posthemorrhagic anemia: Secondary | ICD-10-CM | POA: Diagnosis present

## 2018-01-11 DIAGNOSIS — I2510 Atherosclerotic heart disease of native coronary artery without angina pectoris: Secondary | ICD-10-CM | POA: Diagnosis present

## 2018-01-11 DIAGNOSIS — K264 Chronic or unspecified duodenal ulcer with hemorrhage: Principal | ICD-10-CM | POA: Diagnosis present

## 2018-01-11 DIAGNOSIS — N400 Enlarged prostate without lower urinary tract symptoms: Secondary | ICD-10-CM | POA: Diagnosis present

## 2018-01-11 DIAGNOSIS — N6489 Other specified disorders of breast: Secondary | ICD-10-CM | POA: Diagnosis present

## 2018-01-11 DIAGNOSIS — K5520 Angiodysplasia of colon without hemorrhage: Secondary | ICD-10-CM | POA: Diagnosis not present

## 2018-01-11 DIAGNOSIS — M109 Gout, unspecified: Secondary | ICD-10-CM | POA: Diagnosis present

## 2018-01-11 DIAGNOSIS — E778 Other disorders of glycoprotein metabolism: Secondary | ICD-10-CM | POA: Diagnosis present

## 2018-01-11 DIAGNOSIS — Z9842 Cataract extraction status, left eye: Secondary | ICD-10-CM

## 2018-01-11 DIAGNOSIS — R571 Hypovolemic shock: Secondary | ICD-10-CM | POA: Diagnosis present

## 2018-01-11 DIAGNOSIS — I480 Paroxysmal atrial fibrillation: Secondary | ICD-10-CM | POA: Diagnosis present

## 2018-01-11 DIAGNOSIS — K260 Acute duodenal ulcer with hemorrhage: Secondary | ICD-10-CM | POA: Diagnosis not present

## 2018-01-11 DIAGNOSIS — N179 Acute kidney failure, unspecified: Secondary | ICD-10-CM

## 2018-01-11 DIAGNOSIS — M1991 Primary osteoarthritis, unspecified site: Secondary | ICD-10-CM | POA: Diagnosis present

## 2018-01-11 DIAGNOSIS — Z961 Presence of intraocular lens: Secondary | ICD-10-CM | POA: Diagnosis present

## 2018-01-11 DIAGNOSIS — I5042 Chronic combined systolic (congestive) and diastolic (congestive) heart failure: Secondary | ICD-10-CM | POA: Diagnosis present

## 2018-01-11 DIAGNOSIS — R740 Nonspecific elevation of levels of transaminase and lactic acid dehydrogenase [LDH]: Secondary | ICD-10-CM | POA: Diagnosis present

## 2018-01-11 DIAGNOSIS — K921 Melena: Secondary | ICD-10-CM

## 2018-01-11 DIAGNOSIS — I130 Hypertensive heart and chronic kidney disease with heart failure and stage 1 through stage 4 chronic kidney disease, or unspecified chronic kidney disease: Secondary | ICD-10-CM | POA: Diagnosis not present

## 2018-01-11 DIAGNOSIS — Z7982 Long term (current) use of aspirin: Secondary | ICD-10-CM

## 2018-01-11 DIAGNOSIS — N184 Chronic kidney disease, stage 4 (severe): Secondary | ICD-10-CM | POA: Diagnosis present

## 2018-01-11 DIAGNOSIS — I428 Other cardiomyopathies: Secondary | ICD-10-CM | POA: Diagnosis present

## 2018-01-11 DIAGNOSIS — Z79890 Hormone replacement therapy (postmenopausal): Secondary | ICD-10-CM

## 2018-01-11 DIAGNOSIS — K259 Gastric ulcer, unspecified as acute or chronic, without hemorrhage or perforation: Secondary | ICD-10-CM | POA: Diagnosis not present

## 2018-01-11 DIAGNOSIS — Z833 Family history of diabetes mellitus: Secondary | ICD-10-CM

## 2018-01-11 DIAGNOSIS — N183 Chronic kidney disease, stage 3 (moderate): Secondary | ICD-10-CM | POA: Diagnosis not present

## 2018-01-11 DIAGNOSIS — K222 Esophageal obstruction: Secondary | ICD-10-CM | POA: Diagnosis not present

## 2018-01-11 DIAGNOSIS — Z79899 Other long term (current) drug therapy: Secondary | ICD-10-CM

## 2018-01-11 DIAGNOSIS — Z8249 Family history of ischemic heart disease and other diseases of the circulatory system: Secondary | ICD-10-CM

## 2018-01-11 DIAGNOSIS — E8809 Other disorders of plasma-protein metabolism, not elsewhere classified: Secondary | ICD-10-CM | POA: Diagnosis present

## 2018-01-11 DIAGNOSIS — E039 Hypothyroidism, unspecified: Secondary | ICD-10-CM | POA: Diagnosis present

## 2018-01-11 DIAGNOSIS — Z881 Allergy status to other antibiotic agents status: Secondary | ICD-10-CM

## 2018-01-11 DIAGNOSIS — K279 Peptic ulcer, site unspecified, unspecified as acute or chronic, without hemorrhage or perforation: Secondary | ICD-10-CM | POA: Diagnosis not present

## 2018-01-11 DIAGNOSIS — K5730 Diverticulosis of large intestine without perforation or abscess without bleeding: Secondary | ICD-10-CM | POA: Diagnosis present

## 2018-01-11 DIAGNOSIS — I959 Hypotension, unspecified: Secondary | ICD-10-CM

## 2018-01-11 DIAGNOSIS — R55 Syncope and collapse: Secondary | ICD-10-CM

## 2018-01-11 DIAGNOSIS — Z87891 Personal history of nicotine dependence: Secondary | ICD-10-CM

## 2018-01-11 DIAGNOSIS — I4510 Unspecified right bundle-branch block: Secondary | ICD-10-CM | POA: Diagnosis present

## 2018-01-11 DIAGNOSIS — Z7901 Long term (current) use of anticoagulants: Secondary | ICD-10-CM

## 2018-01-11 DIAGNOSIS — Z856 Personal history of leukemia: Secondary | ICD-10-CM

## 2018-01-11 DIAGNOSIS — K269 Duodenal ulcer, unspecified as acute or chronic, without hemorrhage or perforation: Secondary | ICD-10-CM | POA: Diagnosis not present

## 2018-01-11 DIAGNOSIS — D6832 Hemorrhagic disorder due to extrinsic circulating anticoagulants: Secondary | ICD-10-CM | POA: Diagnosis present

## 2018-01-11 DIAGNOSIS — K219 Gastro-esophageal reflux disease without esophagitis: Secondary | ICD-10-CM | POA: Diagnosis present

## 2018-01-11 DIAGNOSIS — E1122 Type 2 diabetes mellitus with diabetic chronic kidney disease: Secondary | ICD-10-CM | POA: Diagnosis present

## 2018-01-11 DIAGNOSIS — H5461 Unqualified visual loss, right eye, normal vision left eye: Secondary | ICD-10-CM | POA: Diagnosis present

## 2018-01-11 DIAGNOSIS — Z7951 Long term (current) use of inhaled steroids: Secondary | ICD-10-CM

## 2018-01-11 DIAGNOSIS — Z955 Presence of coronary angioplasty implant and graft: Secondary | ICD-10-CM

## 2018-01-11 DIAGNOSIS — E785 Hyperlipidemia, unspecified: Secondary | ICD-10-CM | POA: Diagnosis present

## 2018-01-11 DIAGNOSIS — K922 Gastrointestinal hemorrhage, unspecified: Secondary | ICD-10-CM | POA: Diagnosis not present

## 2018-01-11 DIAGNOSIS — Z452 Encounter for adjustment and management of vascular access device: Secondary | ICD-10-CM | POA: Diagnosis not present

## 2018-01-11 DIAGNOSIS — Z8546 Personal history of malignant neoplasm of prostate: Secondary | ICD-10-CM

## 2018-01-11 DIAGNOSIS — Z888 Allergy status to other drugs, medicaments and biological substances status: Secondary | ICD-10-CM

## 2018-01-11 LAB — CBC WITH DIFFERENTIAL/PLATELET
ABS IMMATURE GRANULOCYTES: 0.03 10*3/uL (ref 0.00–0.07)
BASOS ABS: 0.1 10*3/uL (ref 0.0–0.1)
Basophils Relative: 1 %
EOS ABS: 0.2 10*3/uL (ref 0.0–0.5)
Eosinophils Relative: 2 %
HEMATOCRIT: 32.9 % — AB (ref 39.0–52.0)
Hemoglobin: 10.2 g/dL — ABNORMAL LOW (ref 13.0–17.0)
IMMATURE GRANULOCYTES: 0 %
LYMPHS PCT: 33 %
Lymphs Abs: 2.8 10*3/uL (ref 0.7–4.0)
MCH: 27.6 pg (ref 26.0–34.0)
MCHC: 31 g/dL (ref 30.0–36.0)
MCV: 89.2 fL (ref 80.0–100.0)
MONOS PCT: 11 %
Monocytes Absolute: 0.9 10*3/uL (ref 0.1–1.0)
NEUTROS PCT: 53 %
NRBC: 0 % (ref 0.0–0.2)
Neutro Abs: 4.5 10*3/uL (ref 1.7–7.7)
Platelets: 225 10*3/uL (ref 150–400)
RBC: 3.69 MIL/uL — ABNORMAL LOW (ref 4.22–5.81)
RDW: 14.9 % (ref 11.5–15.5)
WBC: 8.4 10*3/uL (ref 4.0–10.5)

## 2018-01-11 LAB — COMPREHENSIVE METABOLIC PANEL
ALBUMIN: 3.2 g/dL — AB (ref 3.5–5.0)
ALK PHOS: 52 U/L (ref 38–126)
ALT: 31 U/L (ref 0–44)
AST: 42 U/L — AB (ref 15–41)
Anion gap: 7 (ref 5–15)
BILIRUBIN TOTAL: 0.5 mg/dL (ref 0.3–1.2)
BUN: 23 mg/dL (ref 8–23)
CALCIUM: 8.9 mg/dL (ref 8.9–10.3)
CO2: 24 mmol/L (ref 22–32)
Chloride: 104 mmol/L (ref 98–111)
Creatinine, Ser: 1.43 mg/dL — ABNORMAL HIGH (ref 0.61–1.24)
GFR calc Af Amer: 51 mL/min — ABNORMAL LOW (ref >60)
GFR, EST NON AFRICAN AMERICAN: 44 mL/min — AB (ref >60)
GLUCOSE: 263 mg/dL — AB (ref 70–99)
POTASSIUM: 4.5 mmol/L (ref 3.5–5.1)
Sodium: 135 mmol/L (ref 135–145)
TOTAL PROTEIN: 6.4 g/dL — AB (ref 6.5–8.1)

## 2018-01-11 LAB — PREALBUMIN: Prealbumin: 17.4 mg/dL — ABNORMAL LOW (ref 18–38)

## 2018-01-11 LAB — T4, FREE: Free T4: 1.1 ng/dL (ref 0.82–1.77)

## 2018-01-11 LAB — GLUCOSE, CAPILLARY
GLUCOSE-CAPILLARY: 278 mg/dL — AB (ref 70–99)
GLUCOSE-CAPILLARY: 259 mg/dL — AB (ref 70–99)
Glucose-Capillary: 244 mg/dL — ABNORMAL HIGH (ref 70–99)

## 2018-01-11 LAB — HEMOGLOBIN AND HEMATOCRIT, BLOOD
HEMATOCRIT: 30.5 % — AB (ref 39.0–52.0)
HEMATOCRIT: 30.3 % — AB (ref 39.0–52.0)
HEMATOCRIT: 28.1 % — AB (ref 39.0–52.0)
HEMOGLOBIN: 9.8 g/dL — AB (ref 13.0–17.0)
HEMOGLOBIN: 9.4 g/dL — AB (ref 13.0–17.0)
Hemoglobin: 9.9 g/dL — ABNORMAL LOW (ref 13.0–17.0)

## 2018-01-11 LAB — PROTIME-INR
INR: 2.35
INR: 1.37
INR: 1.36
INR: 1.33
PROTHROMBIN TIME: 16.3 s — AB (ref 11.4–15.2)
Prothrombin Time: 25.4 seconds — ABNORMAL HIGH (ref 11.4–15.2)
Prothrombin Time: 16.7 seconds — ABNORMAL HIGH (ref 11.4–15.2)
Prothrombin Time: 16.6 seconds — ABNORMAL HIGH (ref 11.4–15.2)

## 2018-01-11 LAB — MAGNESIUM: Magnesium: 1.7 mg/dL (ref 1.7–2.4)

## 2018-01-11 LAB — HEMOGLOBIN
HEMOGLOBIN: 8.1 g/dL — AB (ref 13.0–17.0)
Hemoglobin: 9.9 g/dL — ABNORMAL LOW (ref 13.0–17.0)

## 2018-01-11 LAB — TROPONIN I: Troponin I: <0.03 ng/mL (ref <0.03)

## 2018-01-11 LAB — PHOSPHORUS: PHOSPHORUS: 2.5 mg/dL (ref 2.5–4.6)

## 2018-01-11 LAB — MRSA PCR SCREENING: MRSA BY PCR: NEGATIVE

## 2018-01-11 LAB — LIPASE, BLOOD: Lipase: 37 U/L (ref 11–51)

## 2018-01-11 LAB — ABO/RH: ABO/RH(D): AB NEG

## 2018-01-11 MED ORDER — TECHNETIUM TC 99M-LABELED RED BLOOD CELLS IV KIT
22.0400 | PACK | Freq: Once | INTRAVENOUS | Status: AC | PRN
  Administered 2018-01-11: 22.04 via INTRAVENOUS

## 2018-01-11 MED ORDER — VITAMIN K1 10 MG/ML IJ SOLN
10.0000 mg | Freq: Once | INTRAVENOUS | Status: AC
  Administered 2018-01-11: 10 mg via INTRAVENOUS
  Filled 2018-01-11: qty 1

## 2018-01-11 MED ORDER — NITROGLYCERIN 0.4 MG SL SUBL: 0.4000 mg | SUBLINGUAL_TABLET | SUBLINGUAL | Status: DC | PRN

## 2018-01-11 MED ORDER — SODIUM CHLORIDE 0.9 % IV BOLUS: 500.0000 mL | Freq: Once | INTRAVENOUS | Status: DC

## 2018-01-11 MED ORDER — SODIUM CHLORIDE 0.9% FLUSH
3.0000 mL | Freq: Two times a day (BID) | INTRAVENOUS | Status: DC
  Administered 2018-01-11 – 2018-01-13 (×5): 3 mL via INTRAVENOUS

## 2018-01-11 MED ORDER — SENNOSIDES-DOCUSATE SODIUM 8.6-50 MG PO TABS: 1.0000 | ORAL_TABLET | Freq: Every evening | ORAL | Status: DC | PRN

## 2018-01-11 MED ORDER — POLYETHYLENE GLYCOL 3350 17 GM/SCOOP PO POWD
1.0000 | Freq: Once | ORAL | Status: DC
  Filled 2018-01-11: qty 255

## 2018-01-11 MED ORDER — FAMOTIDINE 20 MG PO TABS: 20.0000 mg | ORAL_TABLET | Freq: Two times a day (BID) | ORAL | Status: DC

## 2018-01-11 MED ORDER — PROTHROMBIN COMPLEX CONC HUMAN 500 UNITS IV KIT
2000.0000 [IU] | PACK | Freq: Once | Status: AC
  Administered 2018-01-11: 2000 [IU] via INTRAVENOUS
  Filled 2018-01-11: qty 2000

## 2018-01-11 MED ORDER — LEVOTHYROXINE SODIUM 50 MCG PO TABS: 50.0000 ug | ORAL_TABLET | Freq: Every day | ORAL | Status: DC

## 2018-01-11 MED ORDER — ENALAPRIL MALEATE 10 MG PO TABS: 5.0000 mg | ORAL_TABLET | Freq: Every day | ORAL | Status: DC

## 2018-01-11 MED ORDER — ALLOPURINOL 100 MG PO TABS: 100.0000 mg | ORAL_TABLET | Freq: Every day | ORAL | Status: DC

## 2018-01-11 MED ORDER — ACETAMINOPHEN 650 MG RE SUPP: 650.0000 mg | Freq: Four times a day (QID) | RECTAL | Status: DC | PRN

## 2018-01-11 MED ORDER — SODIUM CHLORIDE 0.9 % IV BOLUS: 1000.0000 mL | Freq: Once | INTRAVENOUS | Status: DC

## 2018-01-11 MED ORDER — BOSUTINIB 100 MG PO TABS: 200.0000 mg | ORAL_TABLET | Freq: Every day | ORAL | Status: DC

## 2018-01-11 MED ORDER — PANTOPRAZOLE SODIUM 40 MG PO TBEC: 40.0000 mg | DELAYED_RELEASE_TABLET | Freq: Every day | ORAL | Status: DC

## 2018-01-11 MED ORDER — ALBUTEROL SULFATE (2.5 MG/3ML) 0.083% IN NEBU: 2.5000 mg | INHALATION_SOLUTION | Freq: Four times a day (QID) | RESPIRATORY_TRACT | Status: DC | PRN

## 2018-01-11 MED ORDER — ONDANSETRON HCL 4 MG PO TABS: 4.0000 mg | ORAL_TABLET | Freq: Four times a day (QID) | ORAL | Status: DC | PRN

## 2018-01-11 MED ORDER — PANTOPRAZOLE SODIUM 40 MG IV SOLR
40.0000 mg | Freq: Two times a day (BID) | INTRAVENOUS | Status: DC
  Administered 2018-01-11 – 2018-01-13 (×6): 40 mg via INTRAVENOUS
  Filled 2018-01-11 (×6): qty 40

## 2018-01-11 MED ORDER — ATORVASTATIN CALCIUM 20 MG PO TABS: 80.0000 mg | ORAL_TABLET | Freq: Every day | ORAL | Status: DC

## 2018-01-11 MED ORDER — INSULIN ASPART 100 UNIT/ML ~~LOC~~ SOLN
0.0000 [IU] | Freq: Three times a day (TID) | SUBCUTANEOUS | Status: DC
  Administered 2018-01-11: 5 [IU] via SUBCUTANEOUS
  Administered 2018-01-12 – 2018-01-13 (×4): 3 [IU] via SUBCUTANEOUS
  Administered 2018-01-13: 2 [IU] via SUBCUTANEOUS
  Administered 2018-01-14: 5 [IU] via SUBCUTANEOUS
  Administered 2018-01-14: 3 [IU] via SUBCUTANEOUS
  Filled 2018-01-11 (×8): qty 1

## 2018-01-11 MED ORDER — TORSEMIDE 20 MG PO TABS: 20.0000 mg | ORAL_TABLET | Freq: Every day | ORAL | Status: DC

## 2018-01-11 MED ORDER — INSULIN ASPART 100 UNIT/ML ~~LOC~~ SOLN
0.0000 [IU] | Freq: Every day | SUBCUTANEOUS | Status: DC
  Administered 2018-01-11: 2 [IU] via SUBCUTANEOUS
  Filled 2018-01-11: qty 1

## 2018-01-11 MED ORDER — SODIUM CHLORIDE 0.9% IV SOLUTION
Freq: Once | INTRAVENOUS | Status: AC
  Administered 2018-01-11: 08:00:00 via INTRAVENOUS
  Filled 2018-01-11: qty 250

## 2018-01-11 MED ORDER — FENTANYL CITRATE (PF) 100 MCG/2ML IJ SOLN: 25.0000 ug | INTRAMUSCULAR | Status: DC | PRN

## 2018-01-11 MED ORDER — ONDANSETRON HCL 4 MG/2ML IJ SOLN
4.0000 mg | Freq: Four times a day (QID) | INTRAMUSCULAR | Status: DC | PRN
  Administered 2018-01-11: 4 mg via INTRAVENOUS
  Filled 2018-01-11: qty 2

## 2018-01-11 MED ORDER — BISACODYL 5 MG PO TBEC: 5.0000 mg | DELAYED_RELEASE_TABLET | Freq: Every day | ORAL | Status: DC | PRN

## 2018-01-11 MED ORDER — FLUTICASONE FUROATE-VILANTEROL 100-25 MCG/INH IN AEPB
1.0000 | INHALATION_SPRAY | Freq: Every day | RESPIRATORY_TRACT | Status: DC
  Administered 2018-01-11 – 2018-01-14 (×3): 1 via RESPIRATORY_TRACT
  Filled 2018-01-11 (×2): qty 28

## 2018-01-11 MED ORDER — WARFARIN SODIUM 3 MG PO TABS: 3.0000 mg | ORAL_TABLET | ORAL | Status: DC

## 2018-01-11 MED ORDER — TAMSULOSIN HCL 0.4 MG PO CAPS: 0.4000 mg | ORAL_CAPSULE | Freq: Every day | ORAL | Status: DC

## 2018-01-11 MED ORDER — SODIUM CHLORIDE 0.9 % IV SOLN
INTRAVENOUS | Status: DC
  Administered 2018-01-11 – 2018-01-12 (×3): via INTRAVENOUS

## 2018-01-11 MED ORDER — METOPROLOL SUCCINATE ER 50 MG PO TB24: 50.0000 mg | ORAL_TABLET | ORAL | Status: DC

## 2018-01-11 MED ORDER — ASPIRIN EC 81 MG PO TBEC: 81.0000 mg | DELAYED_RELEASE_TABLET | Freq: Every day | ORAL | Status: DC

## 2018-01-11 MED ORDER — SODIUM CHLORIDE 0.9 % IV SOLN
Freq: Once | INTRAVENOUS | Status: AC
  Administered 2018-01-11: 12:00:00 via INTRAVENOUS

## 2018-01-11 MED ORDER — VITAMIN K1 10 MG/ML IJ SOLN
5.0000 mg | Freq: Once | INTRAVENOUS | Status: DC
  Filled 2018-01-11: qty 0.5

## 2018-01-11 MED ORDER — ACETAMINOPHEN 325 MG PO TABS: 650.0000 mg | ORAL_TABLET | Freq: Four times a day (QID) | ORAL | Status: DC | PRN

## 2018-01-11 MED ORDER — LACTATED RINGERS IV BOLUS
1000.0000 mL | Freq: Once | INTRAVENOUS | Status: AC
  Administered 2018-01-11: 1000 mL via INTRAVENOUS

## 2018-01-11 MED ORDER — SODIUM CHLORIDE 0.9 % IV BOLUS
500.0000 mL | Freq: Once | INTRAVENOUS | Status: AC
  Administered 2018-01-11: 500 mL via INTRAVENOUS

## 2018-01-11 MED ORDER — ISOSORBIDE MONONITRATE ER 60 MG PO TB24: 60.0000 mg | ORAL_TABLET | Freq: Every day | ORAL | Status: DC

## 2018-01-11 MED ORDER — TRANEXAMIC ACID-NACL 1000-0.7 MG/100ML-% IV SOLN
1000.0000 mg | INTRAVENOUS | Status: AC
  Administered 2018-01-11: 1000 mg via INTRAVENOUS
  Filled 2018-01-11: qty 100

## 2018-01-11 MED ORDER — FENOFIBRATE 160 MG PO TABS: 160.0000 mg | ORAL_TABLET | Freq: Every day | ORAL | Status: DC

## 2018-01-11 NOTE — ED Provider Notes (Addendum)
[facility] Emergency Department Provider Note   ____________________________________________   First MD Initiated Contact with Patient 01/11/18 [phone]     (approximate)  I have reviewed the triage vital signs and the nursing notes.   HISTORY  Chief Complaint Rectal Bleeding    HPI Lucas Caldwell is a 84 y.o. male who presents to the ED from home for chief complaint of rectal bleeding.  Patient has not had similar symptoms previously.  Felt his stomach "rumbling", went to the restroom and noted dark rectal bleeding.  Takes warfarin for paroxysmal A. fib.  It was recently checked and INR was 1.5.  He was told to increase his warfarin for a few days.  Also recently had a screening colonoscopy 10 days ago.  A medium-sized angioectasia without bleeding was found in his proximal ascending colon status post argon plasma coagulation.  Complains of feeling lightheaded and dizzy.  Denies recent fever, chills, chest pain, shortness of breath, abdominal pain, nausea, vomiting or diarrhea.  Denies recent travel or trauma.   Past Medical History:  Diagnosis Date  . Arthritis    "probably in his legs" (08/15/2016)  . Blind right eye   . BPH (benign prostatic hyperplasia)   . Breast asymmetry    Left breast is larger, present for several years.  [name] CAD (coronary artery disease)    a. Cath in the late 90's - reportedly ok;  b. 2014 s/p stenting x 2 @ [facility]; c 08/19/16 Cath/PCI with DES -> RCA, plan to treat LM 70% medically. Seen by surgery and felt to be too high risk for CABG.  . Chronic combined systolic (congestive) and diastolic (congestive) heart failure ([place])    a. Previously reduced EF-->50% by echo in 2012;  b. 06/2015 Echo: EF 50-55%  c. 07/2016 Echo: EF 45-50%.  . Chronic kidney disease (CKD), stage IV (severe) ([place])   . CML (chronic myelocytic leukemia) ([place])   . GERD (gastroesophageal reflux disease)   . Gout   . Hyperlipemia   . Hypertension   . Hypothyroidism    . Iron deficiency anemia   . Migraine    "in the 1960s" (08/15/2016)  . NICM (nonischemic cardiomyopathy) ([place])    a. Previously reduced EF-->50% by echo in 2012;  b. 06/2015 Echo: EF 50-55%, Gr2 DD, mild MR, mildly dil LA, Ao sclerosis, mild TR;  c. 07/2016 Echo: EF 45-50%, ? inf HK, mild MR, mildly dil LA.  [name] PAF (paroxysmal atrial fibrillation) ([facility])    a. ? Dx 2014-->s/p DCCV;  b. CHA2DS2VASc = 6-->chronic coumadin.  . Prostate cancer ([place])   . RBBB   . Type II diabetes mellitus [facility])     Patient Active Problem List   Diagnosis Date Noted  . Rectal bleeding 01/11/2018  . AVM (arteriovenous malformation) of colon without hemorrhage   . Schatzki's ring   . Esophageal dysphagia   . Diabetic retinopathy ([place]) 07/08/2017  . Ischemic cardiomyopathy 06/26/2017  . NICM (nonischemic cardiomyopathy) ([place]) 06/26/2017  . COPD (chronic obstructive pulmonary disease) ([place]) 06/13/2017  . Fatigue 12/18/2016  . Stable angina ([place]) 09/20/2016  . CKD (chronic kidney disease), stage III ([place]) 09/20/2016  . Paroxysmal atrial fibrillation ([place]) 08/28/2016  . Chronic anticoagulation 08/28/2016  . Essential hypertension 08/20/2016  . Coronary artery disease involving native coronary artery of native heart without angina pectoris 08/19/2016  . Shortness of breath 08/12/2016  . CML (chronic myeloid leukemia) ([place]) 12/29/2015  . Gynecomastia, male 03/31/2014  . Hypertrophy of breast  03/31/2014  . Nuclear sclerosis of left eye 10/22/2013  . Enthesopathy of ankle and tarsus 04/10/2013  . Uncontrolled type 2 diabetes mellitus with nephropathy ([place]) 04/09/2013  . Diabetic neuropathy associated with type 2 diabetes mellitus ([place]) 04/09/2013  . Cataract of left eye 11/25/2011  . Corneal opacity 09/30/2011  . Pseudophakia of right eye 09/30/2011  . GERD (gastroesophageal reflux disease) 09/25/2011  . Gout 09/25/2011  . Hyperlipidemia LDL goal <70 09/25/2011  . Status post cataract extraction 04/11/2011    . Cataract extraction status of eye 04/11/2011  . Iron deficiency anemia 04/01/2011  . Benign localized hyperplasia of prostate without urinary obstruction and other lower urinary tract symptoms (LUTS) 12/10/2010  . Enlarged prostate without lower urinary tract symptoms (luts) 12/10/2010  . Colon cancer screening 10/11/2010  . Hypothyroidism 10/11/2010  . Hyperlipidemia 09/20/2003  . Essential and other specified forms of tremor 01/05/2001  . Essential tremor 01/05/2001  . Chronic systolic CHF (congestive heart failure) ([place]) 06/22/1993    Past Surgical History:  Procedure Laterality Date  . CATARACT EXTRACTION W/ INTRAOCULAR LENS  IMPLANT, BILATERAL Bilateral   . COLONOSCOPY WITH PROPOFOL N/A 01/01/2018   Procedure: COLONOSCOPY WITH PROPOFOL;  Surgeon: Lin Landsman, MD;  Location: [facility] ENDOSCOPY;  Service: Gastroenterology;  Laterality: N/A;  . CORONARY ANGIOPLASTY WITH STENT PLACEMENT  09/2012   2 stents  . CORONARY STENT INTERVENTION N/A 08/19/2016   Procedure: Coronary Stent Intervention;  Surgeon: [name], MD;  Location: [facility] CV LAB;  Service: Cardiovascular;  Laterality: N/A;  . ESOPHAGEAL MANOMETRY N/A 12/16/2017   Procedure: ESOPHAGEAL MANOMETRY (EM);  Surgeon: Lin Landsman, MD;  Location: [facility] ENDOSCOPY;  Service: Gastroenterology;  Laterality: N/A;  . ESOPHAGOGASTRODUODENOSCOPY N/A 12/20/2016   Procedure: ESOPHAGOGASTRODUODENOSCOPY (EGD);  Surgeon: Lin Landsman, MD;  Location: [facility] ENDOSCOPY;  Service: Gastroenterology;  Laterality: N/A;  . ESOPHAGOGASTRODUODENOSCOPY (EGD) WITH PROPOFOL N/A 11/03/2017   Procedure: ESOPHAGOGASTRODUODENOSCOPY (EGD) WITH PROPOFOL;  Surgeon: Lin Landsman, MD;  Location: [facility] ENDOSCOPY;  Service: Gastroenterology;  Laterality: N/A;  . EYE SURGERY    . HERNIA REPAIR     "[place]"  . LEFT HEART CATH AND CORONARY ANGIOGRAPHY N/A 08/14/2016   Procedure: Left Heart Cath and Coronary Angiography;  Surgeon: [name], MD;  Location: [place] CV LAB;  Service: Cardiovascular;  Laterality: N/A;  . TOE AMPUTATION Right    "big toe"    Prior to Admission medications   Medication Sig Start Date End Date Taking? Authorizing Provider  acetaminophen (TYLENOL) 500 MG tablet Take 500 mg by mouth every 6 (six) hours as needed.    [provider]  albuterol (PROVENTIL HFA;VENTOLIN HFA) 108 (90 Base) MCG/ACT inhaler Inhale 2 puffs into the lungs every 6 (six) hours as needed for wheezing or shortness of breath. 05/13/17   Theora Gianotti, NP  Alcohol Swabs 70 % PADS Use when checking blood sugar up to 3x daily 04/09/17   Parks Ranger, [name], DO  allopurinol (ZYLOPRIM) 100 MG tablet Take 100 mg by mouth daily. 02/03/17   [provider]  aspirin EC 81 MG tablet Take 1 tablet (81 mg total) by mouth daily. 09/02/17   Theora Gianotti, NP  atorvastatin (LIPITOR) 80 MG tablet Take 1 tablet (80 mg total) by mouth daily. 09/24/17   Karamalegos, [name], DO  atropine 1 % ophthalmic solution Apply to eye.    [provider]  bosutinib (BOSULIF) 100 MG tablet Take 200 mg by mouth at bedtime. Take with food.  [provider]  Dulaglutide (TRULICITY) 5.95 GL/8.7FI SOPN Inject 0.75 mg into the skin once a week. 10/31/17   Karamalegos, [name], DO  enalapril (VASOTEC) 5 MG tablet Take 1 tablet (5 mg total) by mouth daily. 08/06/17   Karamalegos, [name], DO  fenofibrate 160 MG tablet Take 1 tablet (160 mg total) by mouth daily. 09/24/17   Karamalegos, Alexander J, DO  fluticasone furoate-vilanterol (BREO ELLIPTA) 100-25 MCG/INH AEPB Inhale 1 puff into the lungs daily. 06/13/17   Karamalegos, [name], DO  gabapentin (NEURONTIN) 300 MG capsule Take 2 capsules in morning, 1 capsule in afternoon, 2 capsules at bedtime. In future increase up to max of 3 capsules 3 times daily 12/31/17   Karamalegos, [name], DO  Insulin Pen Needle 31G X 5 MM MISC 31  g by Does not apply route as directed. 04/16/17   [name], NP  isosorbide mononitrate (IMDUR) 60 MG 24 hr tablet Take 1 tablet (60 mg total) by mouth daily. 03/25/17   End, [name], MD  levothyroxine (SYNTHROID, LEVOTHROID) 50 MCG tablet Take 50 mcg by mouth daily before breakfast.    [provider]  loperamide (IMODIUM A-D) 2 MG tablet Take 2 mg by mouth 3 (three) times daily as needed for diarrhea or loose stools.     [provider]  metoprolol succinate (TOPROL-XL) 50 MG 24 hr tablet Take 1 tablet (50 mg total) by mouth every morning. 06/24/17   Karamalegos, [name], DO  Multiple Vitamins-Minerals (MULTIVITAMIN ADULTS 50+ PO) Take 1 tablet by mouth every morning.    [provider]  nepafenac (NEVANAC) 0.1 % ophthalmic suspension Place 1 drop into both eyes 3 (three) times daily as needed.    [provider]  nitroGLYCERIN (NITROSTAT) 0.4 MG SL tablet Place 1 tablet (0.4 mg total) under the tongue every 5 (five) minutes x 3 doses as needed for chest pain. 10/02/16   End, [name], MD  omeprazole (PRILOSEC) 40 MG capsule Take 1 capsule (40 mg total) by mouth daily before breakfast. Patient not taking: Reported on 12/31/2017 11/03/17 02/01/18  Lin Landsman, MD  omeprazole (PRILOSEC) 40 MG capsule Take by mouth.    [provider]  ranitidine (ZANTAC) 300 MG tablet Take 1 tablet (300 mg total) by mouth daily. 07/22/17   Karamalegos, [name], DO  Spacer/Aero-Holding [facility] ADVANTAGE) MISC 1 each by Other route once. 11/04/14   Ahmed Prima, MD  tamsulosin (FLOMAX) 0.4 MG CAPS capsule Take 0.4 mg by mouth.    [provider]  torsemide (DEMADEX) 20 MG tablet Take 20 mg by mouth daily.    [provider]  warfarin (COUMADIN) 3 MG tablet Take 1 tablet (3 mg total) by mouth as directed. 12/25/17   End, [name], MD    Allergies Clopidogrel; Ciprofloxacin; Mirtazapine; and  Spironolactone  Family History  Problem Relation Age of Onset  . Brain cancer Father   . Diabetes Sister   . Heart attack Sister     Social History Social History   Tobacco Use  . Smoking status: Former Smoker    Packs/day: 2.00    Years: 15.00    Pack years: 30.00    Types: Cigarettes  . Smokeless tobacco: Former Systems developer  . Tobacco comment: quit 1979  Substance Use Topics  . Alcohol use: No    Comment: previously drank heavily - quit 1979.  . Drug use: No    Review of Systems  Constitutional: No fever/chills Eyes: No  visual changes. ENT: No sore throat. Cardiovascular: Denies chest pain. Respiratory: Denies shortness of breath. Gastrointestinal: Positive for rectal bleeding.  No abdominal pain.  No nausea, no vomiting.  No diarrhea.  No constipation. Genitourinary: Negative for dysuria. Musculoskeletal: Negative for back pain. Skin: Negative for rash. Neurological: Negative for headaches, focal weakness or numbness.   ____________________________________________   PHYSICAL EXAM:  VITAL SIGNS: ED Triage Vitals [01/11/18 0103]  Enc Vitals Group     BP (!) 156/74     Pulse Rate 93     Resp 18     Temp 98.6 F (37 C)     Temp src      SpO2 98 %     Weight      Height      Head Circumference      Peak Flow      Pain Score      Pain Loc      Pain Edu?      Excl. in [place]?     Constitutional: Alert and oriented. Well appearing and in mild acute distress. Eyes: Conjunctivae are normal. PERRL. EOMI. Head: Atraumatic. Nose: No congestion/rhinnorhea. Mouth/Throat: Mucous membranes are moist.  Oropharynx non-erythematous. Neck: No stridor.   Cardiovascular: Normal rate, regular rhythm. Grossly normal heart sounds.  Good peripheral circulation. Respiratory: Normal respiratory effort.  No retractions. Lungs CTAB. Gastrointestinal: Soft and nontender to light or deep palpation. No distention. No abdominal bruits. No CVA tenderness. Musculoskeletal: No lower  extremity tenderness nor edema.  No joint effusions. Neurologic:  Normal speech and language. No gross focal neurologic deficits are appreciated.  Skin:  Skin is warm, dry and intact. No rash noted. Psychiatric: Mood and affect are normal. Speech and behavior are normal.  ____________________________________________   LABS (all labs ordered are listed, but only abnormal results are displayed)  Labs Reviewed  CBC WITH DIFFERENTIAL/PLATELET - Abnormal; Notable for the following components:      Result Value   RBC 3.69 (*)    Hemoglobin 10.2 (*)    HCT 32.9 (*)    All other components within normal limits  COMPREHENSIVE METABOLIC PANEL - Abnormal; Notable for the following components:   Glucose, Bld 263 (*)    Creatinine, Ser 1.43 (*)    Total Protein 6.4 (*)    Albumin 3.2 (*)    AST 42 (*)    GFR calc non Af Amer 44 (*)    GFR calc Af Amer 51 (*)    All other components within normal limits  PROTIME-INR - Abnormal; Notable for the following components:   Prothrombin Time 25.4 (*)    All other components within normal limits  TROPONIN I  LIPASE, BLOOD  MAGNESIUM  PHOSPHORUS  TROPONIN I  PREALBUMIN  HEMOGLOBIN  HEMOGLOBIN  TYPE AND SCREEN  TYPE AND SCREEN  PREPARE RBC (CROSSMATCH)  ABO/RH  PREPARE FRESH FROZEN PLASMA   ____________________________________________  EKG  ED ECG REPORT I, Gretel Cantu J, the attending physician, personally viewed and interpreted this ECG.   Date: 01/11/2018  EKG Time: 0243  Rate: 87  Rhythm: Accelerated junctional rhythm  Axis: LAD  Intervals:right bundle branch block  ST&T Change: Nonspecific  ____________________________________________  RADIOLOGY  ED MD interpretation: None  Official radiology report(s): No results found.  ____________________________________________   PROCEDURES  Procedure(s) performed: None  Procedures  Critical Care performed: Yes, see critical care note(s)   CRITICAL CARE Performed by:  Paulette Blanch   Total critical care time: 30 minutes  Critical care time  was exclusive of separately billable procedures and treating other patients.  Critical care was necessary to treat or prevent imminent or life-threatening deterioration.  Critical care was time spent personally by me on the following activities: development of treatment plan with patient and/or surrogate as well as nursing, discussions with consultants, evaluation of patient's response to treatment, examination of patient, obtaining history from patient or surrogate, ordering and performing treatments and interventions, ordering and review of laboratory studies, ordering and review of radiographic studies, pulse oximetry and re-evaluation of patient's condition.  ____________________________________________   INITIAL IMPRESSION / ASSESSMENT AND PLAN / ED COURSE  As part of my medical decision making, I reviewed the following data within the [place] History obtained from family, Nursing notes reviewed and incorporated, Labs reviewed, EKG interpreted, Old chart reviewed, Discussed with admitting physician and Notes from prior ED visits   84 year old male on warfarin who presents with rectal bleeding. Differential diagnosis includes, but is not limited to, acute appendicitis, renal colic, testicular torsion, urinary tract infection/pyelonephritis, prostatitis,  epididymitis, diverticulitis, small bowel obstruction or ileus, colitis, abdominal aortic aneurysm, gastroenteritis, hernia, etc.  Will obtain screening lab work including INR, type and screen.  Nurse reports patient had dark stool in the treatment room.  I personalize visualize the commode and see there is some bright red blood around the rim.  Given patient's recent colonoscopy which demonstrated nonbleeding angiectasia and also diverticulosis, will discuss with hospitalist to evaluate patient in the emergency department for admission.  Consider  vitamin K, PCC's pending INR.  Currently there is no significant hemorrhage nor hemo-dynamic compromise.  Clinical Course as of Jan 12 712  Nancy Fetter Jan 11, 2018  0222 Patient use the commode to have a bowel movement and had vasovagal syncope.  Blood sugar checked in the 200s.  Patient came to in less than a minute; currently alert and oriented.   [JS]  4166 Will hold vitamin K after discussing with hospitalist services.   [JS]  [id number] Patient becoming hypotensive.  Hospitalist services has already contacted intensivist and plan on reversing warfarin.  I spoke with Dr. Marius Ditch from GI who performed patient's colonoscopy on 10/17.  She does recommend tagged red blood cell scan and will see patient in the hospital this morning.   [JS]    Clinical Course User Index [JS] Paulette Blanch, MD     ____________________________________________   FINAL CLINICAL IMPRESSION(S) / ED DIAGNOSES  Final diagnoses:  Rectal bleeding  Syncope, unspecified syncope type     ED Discharge Orders    None       Note:  This document was prepared using Dragon voice recognition software and may include unintentional dictation errors.    Paulette Blanch, MD 01/11/18 0630    Paulette Blanch, MD 01/11/18 [phone]

## 2018-01-11 NOTE — ED Triage Notes (Signed)
Patient reports felt stomach "rumbling" went to bathroom and noticed rectal bleeding.

## 2018-01-11 NOTE — Consult Note (Addendum)
Name: Lucas Caldwell MRN: [id number] DOB: 1933/03/21     CONSULTATION DATE: 01/11/2018    HISTORY OF PRESENT ILLNESS:    84 years old gentleman with history of coronary artery disease, congestive heart failure, right bundle branch block, A. fib on Coumadin for anticoagulation chronic myelocytic leukemia, hypertension, dyslipidemia and chronic kidney disease. Patient presented to ED after he had few episodes of melena/rectal bleeding was presyncope.  While in the ED initially he was hypotensive but responded to volume resuscitation, received a Kcentra and vitamin K to reverse effect of Coumadin. All history was obtained the form EMR and nursing. Currently patient to awake, on nasal cannula not in any distress and receiving transfusion.  PAST MEDICAL HISTORY :   has a past medical history of Arthritis, Blind right eye, BPH (benign prostatic hyperplasia), Breast asymmetry, CAD (coronary artery disease), Chronic combined systolic (congestive) and diastolic (congestive) heart failure (HCC), Chronic kidney disease (CKD), stage IV (severe) ([place]), CML (chronic myelocytic leukemia) ([place]), GERD (gastroesophageal reflux disease), Gout, Hyperlipemia, Hypertension, Hypothyroidism, Iron deficiency anemia, Migraine, NICM (nonischemic cardiomyopathy) ([place]), PAF (paroxysmal atrial fibrillation) ([place]), Prostate cancer ([place]), RBBB, and Type II diabetes mellitus ([place]).  has a past surgical history that includes Toe amputation (Right); LEFT HEART CATH AND CORONARY ANGIOGRAPHY (N/A, 08/14/2016); Hernia repair; Cataract extraction w/ intraocular lens  implant, bilateral (Bilateral); Coronary angioplasty with stent (09/2012); CORONARY STENT INTERVENTION (N/A, 08/19/2016); Esophagogastroduodenoscopy (N/A, 12/20/2016); Esophagogastroduodenoscopy (egd) with propofol (N/A, 11/03/2017); Esophageal manometry (N/A, 12/16/2017); Eye surgery; and Colonoscopy with propofol (N/A, 01/01/2018). Prior to Admission medications     Medication Sig Start Date End Date Taking? Authorizing Provider  acetaminophen (TYLENOL) 500 MG tablet Take 500 mg by mouth every 6 (six) hours as needed.   Yes [provider]  albuterol (PROVENTIL HFA;VENTOLIN HFA) 108 (90 Base) MCG/ACT inhaler Inhale 2 puffs into the lungs every 6 (six) hours as needed for wheezing or shortness of breath. 05/13/17  Yes Theora Gianotti, NP  Alcohol Swabs 70 % PADS Use when checking blood sugar up to 3x daily 04/09/17  Yes Karamalegos, [name], DO  aspirin EC 81 MG tablet Take 1 tablet (81 mg total) by mouth daily. 09/02/17  Yes Theora Gianotti, NP  atorvastatin (LIPITOR) 80 MG tablet Take 1 tablet (80 mg total) by mouth daily. 09/24/17  Yes Karamalegos, [name], DO  Dulaglutide (TRULICITY) 7.03 JK/0.9FG SOPN Inject 0.75 mg into the skin once a week. 10/31/17  Yes Karamalegos, Alexander J, DO  enalapril (VASOTEC) 5 MG tablet Take 1 tablet (5 mg total) by mouth daily. 08/06/17  Yes Karamalegos, Alexander J, DO  fluticasone furoate-vilanterol (BREO ELLIPTA) 100-25 MCG/INH AEPB Inhale 1 puff into the lungs daily. 06/13/17  Yes Karamalegos, [name], DO  gabapentin (NEURONTIN) 300 MG capsule Take 2 capsules in morning, 1 capsule in afternoon, 2 capsules at bedtime. In future increase up to max of 3 capsules 3 times daily 12/31/17  Yes Karamalegos, [name], DO  Insulin Pen Needle 31G X 5 MM MISC 31 g by Does not apply route as directed. 04/16/17  Yes [name], NP  loperamide (IMODIUM A-D) 2 MG tablet Take 2 mg by mouth 3 (three) times daily as needed for diarrhea or loose stools.    Yes [provider]  metoprolol succinate (TOPROL-XL) 50 MG 24 hr tablet Take 1 tablet (50 mg total) by mouth every morning. 06/24/17  Yes Karamalegos, [name], DO  Multiple Vitamins-Minerals (MULTIVITAMIN ADULTS 50+ PO) Take 1 tablet by mouth  every morning.   Yes [provider]  nepafenac (NEVANAC) 0.1 % ophthalmic  suspension Place 1 drop into both eyes 3 (three) times daily as needed.   Yes [provider]  nitroGLYCERIN (NITROSTAT) 0.4 MG SL tablet Place 1 tablet (0.4 mg total) under the tongue every 5 (five) minutes x 3 doses as needed for chest pain. 10/02/16  Yes End, [name], MD  omeprazole (PRILOSEC) 40 MG capsule Take 1 capsule (40 mg total) by mouth daily before breakfast. 11/03/17 02/01/18 Yes Vanga, Tally Due, MD  ranitidine (ZANTAC) 300 MG tablet Take 1 tablet (300 mg total) by mouth daily. 07/22/17  Yes Karamalegos, [name], DO  Spacer/Aero-Holding Chambers (OPTICHAMBER ADVANTAGE) MISC 1 each by Other route once. 11/04/14  Yes Ahmed Prima, MD  torsemide (DEMADEX) 20 MG tablet Take 40 mg by mouth daily.    Yes [provider]  warfarin (COUMADIN) 3 MG tablet Take 1 tablet (3 mg total) by mouth as directed. Patient taking differently: Take 1.5-3 mg by mouth as directed. Take 3 mg by mouth daily, except Monday and Friday. On Monday and Friday take 1.5 mg by mouth. 12/25/17  Yes End, [name], MD  allopurinol (ZYLOPRIM) 100 MG tablet Take 100 mg by mouth daily. 02/03/17   [provider]  bosutinib (BOSULIF) 100 MG tablet Take 200 mg by mouth at bedtime. Take with food.     [provider]  fenofibrate 160 MG tablet Take 1 tablet (160 mg total) by mouth daily. Patient not taking: Reported on 01/11/2018 09/24/17   [name], DO  isosorbide mononitrate (IMDUR) 60 MG 24 hr tablet Take 1 tablet (60 mg total) by mouth daily. Patient not taking: Reported on 01/11/2018 03/25/17   End, [name], MD  levothyroxine (SYNTHROID, LEVOTHROID) 50 MCG tablet Take 50 mcg by mouth daily before breakfast.    [provider]  mirtazapine (REMERON) 15 MG tablet Take 15 mg by mouth at bedtime.    [provider]  tamsulosin (FLOMAX) 0.4 MG CAPS capsule Take 0.4 mg by mouth.    [provider]   Allergies  Allergen Reactions  .  Clopidogrel Anaphylaxis    Other reaction(s): ANAPHYLAXIS;altered mental status;  (electrolytes "out of wack" and confusion per family; THEY DO NOT REMEMBER ANY OTHER REACTION)- MSB 10/10/15  . Ciprofloxacin Itching  . Mirtazapine Diarrhea    Bad dreams  . Spironolactone     Other reaction(s): Other (See Comments) gynecomastia    FAMILY HISTORY:  family history includes Brain cancer in his father; Diabetes in his sister; Heart attack in his sister. SOCIAL HISTORY:  reports that he has quit smoking. His smoking use included cigarettes. He has a 30.00 pack-year smoking history. He has quit using smokeless tobacco. He reports that he does not drink alcohol or use drugs.  REVIEW OF SYSTEMS:   Unable to obtain due to critical illness   VITAL SIGNS: Temp:  [97.6 F (36.4 C)-98.6 F (37 C)] 97.7 F (36.5 C) (10/27 0926) Pulse Rate:  [75-100] 87 (10/27 1210) Resp:  [11-23] 18 (10/27 1210) BP: (89-156)/(54-98) 113/97 (10/27 1200) SpO2:  [98 %-100 %] 100 % (10/27 1210)  Physical Examination:  Awake and oriented with no focal neurological deficits On nasal cannula, no distress, able to talk in full sentences, bilateral equal air entry with no adventitious sounds S1 & S2 are audible with no murmur Benign abdomen with feeble peristalsis No leg edema  ASSESSMENT / PLAN:  GI bleeding with coagulopathy due to  Coumadin. Bleeding scan-ve -Reverse coagulopathy and optimize hemodynamic stability -PPI -Follow GI and plan for endoscopy in am  Symptomatic blood loss anemia -Transfuse to keep hemoglobin more than 8 -Monitor H and H  Hypotension with blood loss responded to volume resuscitation -Optimize volume and consider pressors for systolic blood pressure less than 90 -Monitor hemodynamics  Coagulopathy with Coumadin toxicity, received Kcentra and vitamin K -Monitor coags and keep INR less than 1.5  Coronary artery disease status post PCI, HFpEF 45 to 50% with grade 2 diastolic  dysfunction, right bundle branch block and A. fib on Coumadin for anticoagulation.  No acute ST changes on EKG and no chest pain.  Troponin less than 0.03 -Resume aspirin once not actively bleeding  CKD -Optimize hydration, avoid nephrotoxins, monitor renal panel and urine output  Hypothyroidism -Optimize levothyroxine and monitor free T4  H/o Chronic myelocytic leukemia  Diabetes mellitus -Optimize glycemic control  Full code  Supportive care  Critical care time 40 minutes Patient is currently awake, oriented on room air to nasal cannula and is not on any pressors.

## 2018-01-11 NOTE — ED Notes (Signed)
Pt got nauseated and felt like going to vomit. Sat pt up. Pt eyes rolled back in head and was not responding for about 30 seconds. Dr Kerman Passey called to bedside. Pt appears to have had syncope/vagal episode. Pt returned to being alert and oriented.

## 2018-01-11 NOTE — ED Notes (Signed)
Transported to nuclear med with RN and monitor

## 2018-01-11 NOTE — Progress Notes (Signed)
[place] monitoring consult  83 yo male on Warfarin PTA with GIB and KCentra administered. INR prior to Kcentra= 2.35. Hgb 8.1 Plt 225  10/27 1015: INR ~1 hr post infusion= 1.37  INR's ordered q6h x 4 occurrences then daily x 2 days per protocol.  Chinita Greenland PharmD Clinical Pharmacist 01/11/2018

## 2018-01-11 NOTE — Progress Notes (Signed)
[place] monitoring consult  84 yo male on Warfarin PTA with GIB and KCentra administered. INR prior to Kcentra= 2.35. Hgb 8.1 Plt 225  10/27 1015: INR ~1 hr post infusion= 1.37 10/27 1610  INR= 1.36  INR's ordered q6h x 4 occurrences then daily x 2 days per protocol.  Chinita Greenland PharmD Clinical Pharmacist 01/11/2018

## 2018-01-11 NOTE — ED Notes (Signed)
This nurse and Lea, RN attempted x2 for IV with no success. IV team called.

## 2018-01-11 NOTE — ED Provider Notes (Signed)
-----------------------------------------   10:15 AM on 01/11/2018 -----------------------------------------  Patient's last hemoglobin had decreased approximately 2 points.  Continues to have bloody output at times.  Receiving blood products.  Patient had a near syncopal episode, possibly vagal induced as this was shortly after being moved in the bed with an upset stomach.  However at this time I believe central access is required, I placed an urgent right IJ triple-lumen catheter.  Patient verbally consented and agreed to procedure.  Procedure performed without incident, well-tolerated by the patient.  Will obtain x-ray imaging to ensure proper location.  We continue to await ICU bed availability, we have spoken to the ICU multiple times however at this time given the patient's recent near syncopal episode receiving IV blood products with continued GI bleed I do not believe he is necessarily stable for transfer at this time.  CENTRAL LINE Performed by: Harvest Dark Consent: The procedure was performed in an emergent situation. Required items: required blood products, implants, devices, and special equipment available Patient identity confirmed: arm band and provided demographic data Time out: Immediately prior to procedure a "time out" was called to verify the correct patient, procedure, equipment, support staff and site/side marked as required. Indications: vascular access Anesthesia: local infiltration Local anesthetic: lidocaine 1% with epinephrine Anesthetic total: 3 ml Patient sedated: no Preparation: skin prepped with 2% chlorhexidine Skin prep agent dried: skin prep agent completely dried prior to procedure Sterile barriers: all five maximum sterile barriers used - cap, mask, sterile gown, sterile gloves, and large sterile sheet Hand hygiene: hand hygiene performed prior to central venous catheter insertion  Location details: Right IJ  Catheter type: triple lumen Catheter size: 7  Fr Pre-procedure: landmarks identified Ultrasound guidance: Yes Successful placement: yes Post-procedure: line sutured and dressing applied Assessment: blood return through all parts, free fluid flow, placement verified by x-ray and no pneumothorax on x-ray Patient tolerance: Patient tolerated the procedure well with no immediate complications.    Harvest Dark, MD 01/11/18 1017

## 2018-01-11 NOTE — Progress Notes (Signed)
Family Meeting Note  Advance Directive:no  Today a meeting took place with the Patient.and family The following clinical team members were present during this meeting:MD  The following were discussed:Patient's diagnosis: GI bleed Iron deficiency anemia CML , Patient's progosis: Unable to determine and Goals for treatment: Full Code  Additional follow-up to be provided: Full code Chaplain consult to start advanced directives  Time spent during discussion:16 minutes  Lucas Mccaster, MD

## 2018-01-11 NOTE — Progress Notes (Signed)
Spoke with Mateo Flow, RN concerning PICC placement. CVC being placed. PICC order to be cancelled. Jacinto Reap, RN to remove PIV that aren't being used to reduce the risk of a CLABSI.

## 2018-01-11 NOTE — ED Notes (Addendum)
Pt remains in NM with this RN.  Remains on monitor. NAD. Remains alert and oriented. NAD. VSS

## 2018-01-11 NOTE — ED Notes (Signed)
Dr Truitt Merle at bedside to place central line.

## 2018-01-11 NOTE — ED Notes (Signed)
Leaving NM to transport to ICU

## 2018-01-11 NOTE — ED Notes (Signed)
Unsafe to perform orthostatics at this time. Pt had syncope upon sitting on toilet with night shift RN.  Pt still having bloody stools.

## 2018-01-11 NOTE — Progress Notes (Signed)
Events noted  Bleeding scan negative Pt in ICU Hb 9.9 after 2u pRBCs INR 1.36  Plan for EGD and colonoscopy tomorrow CLD Bowel prep ordered Hold asa and coumadin  [name], MD [address]  [place]  [place], [place] [address]  Main: [phone]  Fax: [phone] Pager: [phone]

## 2018-01-11 NOTE — ED Notes (Addendum)
Pt placed on hospital bed. GI consult at bedside.

## 2018-01-11 NOTE — H&P (Signed)
[facility] [place] NAME: Lucas Caldwell    MR#:  [id number]  DATE OF BIRTH:  07/15/1933  DATE OF ADMISSION:  01/11/2018  PRIMARY CARE PHYSICIAN: [name], DO   REQUESTING/REFERRING PHYSICIAN: Paulette Blanch, MD  CHIEF COMPLAINT:   Chief Complaint  Patient presents with  . Rectal Bleeding    HISTORY OF PRESENT ILLNESS:  Lucas Caldwell  is a 84 y.o. male with a known history of T2NIDDM, HTN, HLD, CAD/MI (s/p stents), chronic systolic + diastolic CHF (EF 86-57% as of 08/12/2016 Echo; Grade II disatolic dysfxn as of 84/69/6295 Echo), CKD III-IV, PAF (Coumadin) and recent colonoscopy (01/01/2018), now p/w hematochezia/BRBPR/LGIB. Colonoscopy performed on 01/01/2018 by Dr. Marius Ditch, demonstrated 1x angioectasia (s/p APC), as well as diverticulosis. His Coumadin dosing was recently adjusted. INR is therapeutic at 2.35 on present admission. He states that he was without issue up until yesterday. He states he went to bed @~2200PM on Saturday 02/10/2018 PM. He was woken up from sleep @~2330PM w/ LLQ abdominal cramping/"rumbling". He felt he needed to move his bowels, and went to the bathroom, but passed a large quantity of bright red blood mixed with clots. He endorses some nausea, bloating, indigestion and fatigue/malaise/generalized weakness since he started bleeding, but denies frank abdominal pain at the time of my assessment. He has continued to have blood bowel movements after arriving to the ED, and had a witnessed syncopal episode. He has more recently started to exhibit hypotension, likely 2/2 acute blood loss anemia and hypovolemic shock, prompting the decision to reverse systemic anticoagulation and administer blood products. [name] ordered for pRBC, FFP, vitamin K, KCentra and TXE, in addition to IVF. He has not required pressors thus far. Tagged pRBC GI bleeding scan is pending. GI consulted, case d/w Dr. Marius Ditch by ED provider (Dr.  Beather Arbour).  PAST MEDICAL HISTORY:   Past Medical History:  Diagnosis Date  . Arthritis    "probably in his legs" (08/15/2016)  . Blind right eye   . BPH (benign prostatic hyperplasia)   . Breast asymmetry    Left breast is larger, present for several years.  [name] CAD (coronary artery disease)    a. Cath in the late 90's - reportedly ok;  b. 2014 s/p stenting x 2 @ [facility]; c 08/19/16 Cath/PCI with DES -> RCA, plan to treat LM 70% medically. Seen by surgery and felt to be too high risk for CABG.  . Chronic combined systolic (congestive) and diastolic (congestive) heart failure ([place])    a. Previously reduced EF-->50% by echo in 2012;  b. 06/2015 Echo: EF 50-55%  c. 07/2016 Echo: EF 45-50%.  . Chronic kidney disease (CKD), stage IV (severe) ([place])   . CML (chronic myelocytic leukemia) ([place])   . GERD (gastroesophageal reflux disease)   . Gout   . Hyperlipemia   . Hypertension   . Hypothyroidism   . Iron deficiency anemia   . Migraine    "in the 1960s" (08/15/2016)  . NICM (nonischemic cardiomyopathy) ([place])    a. Previously reduced EF-->50% by echo in 2012;  b. 06/2015 Echo: EF 50-55%, Gr2 DD, mild MR, mildly dil LA, Ao sclerosis, mild TR;  c. 07/2016 Echo: EF 45-50%, ? inf HK, mild MR, mildly dil LA.  [name] PAF (paroxysmal atrial fibrillation) ([facility])    a. ? Dx 2014-->s/p DCCV;  b. CHA2DS2VASc = 6-->chronic coumadin.  . Prostate cancer ([place])   . RBBB   . Type II diabetes  mellitus ([place])     PAST SURGICAL HISTORY:   Past Surgical History:  Procedure Laterality Date  . CATARACT EXTRACTION W/ INTRAOCULAR LENS  IMPLANT, BILATERAL Bilateral   . COLONOSCOPY WITH PROPOFOL N/A 01/01/2018   Procedure: COLONOSCOPY WITH PROPOFOL;  Surgeon: Lin Landsman, MD;  Location: [facility] ENDOSCOPY;  Service: Gastroenterology;  Laterality: N/A;  . CORONARY ANGIOPLASTY WITH STENT PLACEMENT  09/2012   2 stents  . CORONARY STENT INTERVENTION N/A 08/19/2016   Procedure: Coronary Stent Intervention;  Surgeon: [name],  MD;  Location: [facility] LAB;  Service: Cardiovascular;  Laterality: N/A;  . ESOPHAGEAL MANOMETRY N/A 12/16/2017   Procedure: ESOPHAGEAL MANOMETRY (EM);  Surgeon: Lin Landsman, MD;  Location: [facility] ENDOSCOPY;  Service: Gastroenterology;  Laterality: N/A;  . ESOPHAGOGASTRODUODENOSCOPY N/A 12/20/2016   Procedure: ESOPHAGOGASTRODUODENOSCOPY (EGD);  Surgeon: Lin Landsman, MD;  Location: [facility] ENDOSCOPY;  Service: Gastroenterology;  Laterality: N/A;  . ESOPHAGOGASTRODUODENOSCOPY (EGD) WITH PROPOFOL N/A 11/03/2017   Procedure: ESOPHAGOGASTRODUODENOSCOPY (EGD) WITH PROPOFOL;  Surgeon: Lin Landsman, MD;  Location: [facility]COPY;  Service: Gastroenterology;  Laterality: N/A;  . EYE SURGERY    . HERNIA REPAIR     "navel"  . LEFT HEART CATH AND CORONARY ANGIOGRAPHY N/A 08/14/2016   Procedure: Left Heart Cath and Coronary Angiography;  Surgeon: [name], MD;  Location: [facility] CV LAB;  Service: Cardiovascular;  Laterality: N/A;  . TOE AMPUTATION Right    "big toe"    SOCIAL HISTORY:   Social History   Tobacco Use  . Smoking status: Former Smoker    Packs/day: 2.00    Years: 15.00    Pack years: 30.00    Types: Cigarettes  . Smokeless tobacco: Former Systems developer  . Tobacco comment: quit 1979  Substance Use Topics  . Alcohol use: No    Comment: previously drank heavily - quit 1979.    FAMILY HISTORY:   Family History  Problem Relation Age of Onset  . Brain cancer Father   . Diabetes Sister   . Heart attack Sister     DRUG ALLERGIES:   Allergies  Allergen Reactions  . Clopidogrel Anaphylaxis    Other reaction(s): ANAPHYLAXIS;altered mental status;  (electrolytes "out of wack" and confusion per family; THEY DO NOT REMEMBER ANY OTHER REACTION)- MSB 10/10/15  . Ciprofloxacin Itching  . Mirtazapine Diarrhea    Bad dreams  . Spironolactone     Other reaction(s): Other (See Comments) gynecomastia    REVIEW OF SYSTEMS:   Review of Systems    Constitutional: Positive for malaise/fatigue. Negative for chills, diaphoresis, fever and weight loss.  HENT: Negative for congestion, ear pain, hearing loss, nosebleeds, sinus pain, sore throat and tinnitus.   Eyes: Negative for blurred vision, double vision and photophobia.  Respiratory: Negative for cough, hemoptysis, sputum production, shortness of breath and wheezing.   Cardiovascular: Negative for chest pain, palpitations, orthopnea, claudication, leg swelling and PND.  Gastrointestinal: Positive for abdominal pain, blood in stool and nausea. Negative for constipation, diarrhea, heartburn, melena and vomiting.  Genitourinary: Negative for dysuria, frequency, hematuria and urgency.  Musculoskeletal: Negative for back pain, falls, joint pain, myalgias and neck pain.  Skin: Negative for itching and rash.  Neurological: Positive for weakness. Negative for dizziness, tingling, tremors, sensory change, speech change, focal weakness, seizures, loss of consciousness and headaches.  Psychiatric/Behavioral: Negative for memory loss. The patient does not have insomnia.    MEDICATIONS AT HOME:   Prior to Admission medications   Medication Sig Start Date End  Date Taking? Authorizing Provider  acetaminophen (TYLENOL) 500 MG tablet Take 500 mg by mouth every 6 (six) hours as needed.    [provider]  albuterol (PROVENTIL HFA;VENTOLIN HFA) 108 (90 Base) MCG/ACT inhaler Inhale 2 puffs into the lungs every 6 (six) hours as needed for wheezing or shortness of breath. 05/13/17   Theora Gianotti, NP  Alcohol Swabs 70 % PADS Use when checking blood sugar up to 3x daily 04/09/17   Parks Ranger, [name], DO  allopurinol (ZYLOPRIM) 100 MG tablet Take 100 mg by mouth daily. 02/03/17   [provider]  aspirin EC 81 MG tablet Take 1 tablet (81 mg total) by mouth daily. 09/02/17   Theora Gianotti, NP  atorvastatin (LIPITOR) 80 MG tablet Take 1 tablet (80 mg total) by mouth  daily. 09/24/17   Karamalegos, [name], DO  atropine 1 % ophthalmic solution Apply to eye.    [provider]  bosutinib (BOSULIF) 100 MG tablet Take 200 mg by mouth at bedtime. Take with food.     [provider]  Dulaglutide (TRULICITY) 7.25 DG/6.4QI SOPN Inject 0.75 mg into the skin once a week. 10/31/17   Karamalegos, [name], DO  enalapril (VASOTEC) 5 MG tablet Take 1 tablet (5 mg total) by mouth daily. 08/06/17   Karamalegos, [name], DO  fenofibrate 160 MG tablet Take 1 tablet (160 mg total) by mouth daily. 09/24/17   Karamalegos, Alexander J, DO  fluticasone furoate-vilanterol (BREO ELLIPTA) 100-25 MCG/INH AEPB Inhale 1 puff into the lungs daily. 06/13/17   Karamalegos, [name], DO  gabapentin (NEURONTIN) 300 MG capsule Take 2 capsules in morning, 1 capsule in afternoon, 2 capsules at bedtime. In future increase up to max of 3 capsules 3 times daily 12/31/17   Karamalegos, [name], DO  Insulin Pen Needle 31G X 5 MM MISC 31 g by Does not apply route as directed. 04/16/17   [name], NP  isosorbide mononitrate (IMDUR) 60 MG 24 hr tablet Take 1 tablet (60 mg total) by mouth daily. 03/25/17   End, [name], MD  levothyroxine (SYNTHROID, LEVOTHROID) 50 MCG tablet Take 50 mcg by mouth daily before breakfast.    [provider]  loperamide (IMODIUM A-D) 2 MG tablet Take 2 mg by mouth 3 (three) times daily as needed for diarrhea or loose stools.     [provider]  metoprolol succinate (TOPROL-XL) 50 MG 24 hr tablet Take 1 tablet (50 mg total) by mouth every morning. 06/24/17   Karamalegos, [name], DO  Multiple Vitamins-Minerals (MULTIVITAMIN ADULTS 50+ PO) Take 1 tablet by mouth every morning.    [provider]  nepafenac (NEVANAC) 0.1 % ophthalmic suspension Place 1 drop into both eyes 3 (three) times daily as needed.    [provider]  nitroGLYCERIN (NITROSTAT) 0.4 MG SL tablet Place 1 tablet (0.4 mg total)  under the tongue every 5 (five) minutes x 3 doses as needed for chest pain. 10/02/16   End, [name], MD  omeprazole (PRILOSEC) 40 MG capsule Take 1 capsule (40 mg total) by mouth daily before breakfast. Patient not taking: Reported on 12/31/2017 11/03/17 02/01/18  Lin Landsman, MD  omeprazole (PRILOSEC) 40 MG capsule Take by mouth.    [provider]  ranitidine (ZANTAC) 300 MG tablet Take 1 tablet (300 mg total) by mouth daily. 07/22/17   Karamalegos, [name], DO  Spacer/Aero-Holding Chambers [facility] ADVANTAGE) MISC 1 each by Other route once. 11/04/14   Francene Castle  W, MD  tamsulosin (FLOMAX) 0.4 MG CAPS capsule Take 0.4 mg by mouth.    [provider]  torsemide (DEMADEX) 20 MG tablet Take 20 mg by mouth daily.    [provider]  warfarin (COUMADIN) 3 MG tablet Take 1 tablet (3 mg total) by mouth as directed. 12/25/17   End, [name], MD      VITAL SIGNS:  Blood pressure 100/71, pulse 90, temperature 97.7 F (36.5 C), temperature source Oral, resp. rate 20, SpO2 100 %.  PHYSICAL EXAMINATION:  Physical Exam  Constitutional: [name] oriented to person, place, and time. He appears well-developed and well-nourished. [name] active and cooperative.  Non-toxic appearance. He does not have a sickly appearance. He does not appear ill. No distress. [name] not intubated.  HENT:  Head: Normocephalic and atraumatic.  Mouth/Throat: No oropharyngeal exudate.  Eyes: Conjunctivae, EOM and lids are normal. No scleral icterus.  Neck: Neck supple. No JVD present. No thyromegaly present.  Cardiovascular: Normal rate, regular rhythm, S1 normal, S2 normal and normal heart sounds.  No extrasystoles are present. Exam reveals no gallop, no S3, no S4, no distant heart sounds and no friction rub.  No murmur heard. Pulmonary/Chest: Effort normal and breath sounds normal. No accessory muscle usage or stridor. No apnea, no tachypnea and no bradypnea. [name] not intubated. No  respiratory distress. He has no decreased breath sounds. He has no wheezes. He has no rhonchi. He has no rales.  Abdominal: Soft. Bowel sounds are normal. He exhibits no distension. There is no tenderness. There is no rigidity, no rebound and no guarding.  Musculoskeletal: Normal range of motion. He exhibits no edema or tenderness.  Lymphadenopathy:    He has no cervical adenopathy.  Neurological: [name] alert and oriented to person, place, and time. [name] not disoriented.  Skin: Skin is warm, dry and intact. No rash noted. [name] not diaphoretic. No erythema.  Psychiatric: He has a normal mood and affect. His speech is normal and behavior is normal. Judgment and thought content normal. Cognition and memory are normal.   LABORATORY PANEL:   CBC Recent Labs  Lab 01/11/18 0126 01/11/18 0724  WBC 8.4  --   HGB 10.2* 8.1*  HCT 32.9*  --   PLT 225  --    ------------------------------------------------------------------------------------------------------------------  Chemistries  Recent Labs  Lab 01/11/18 0126 01/11/18 0514  NA 135  --   K 4.5  --   CL 104  --   CO2 24  --   GLUCOSE 263*  --   BUN 23  --   CREATININE 1.43*  --   CALCIUM 8.9  --   MG  --  1.7  AST 42*  --   ALT 31  --   ALKPHOS 52  --   BILITOT 0.5  --    ------------------------------------------------------------------------------------------------------------------  Cardiac Enzymes Recent Labs  Lab 01/11/18 0514  TROPONINI <0.03   ------------------------------------------------------------------------------------------------------------------  RADIOLOGY:  [place] Ekg Site Rite  Result Date: 01/11/2018 If Site Rite image not attached, placement could not be confirmed due to current cardiac rhythm.  IMPRESSION AND PLAN:   A/P: 69M w/ PMHx (Afib, Coumadin, INR 2.35), recent colonoscopy (12/22/2017, angioectasia s/p APC, diverticulosis) p/w BRBPR/hematochezia/LGIB, acute blood loss anemia, hypotension,  hypovolemic shock, syncope. Hyperglycemia (w/ T2NIDDM), Cr elevation/CKD III-IV, hypoalbuminemia/hypoproteinemia, mild transaminasemia. -BRBPR/hematochezia/LGIB, acute blood loss anemia, hypotension, hypovolemic shock, syncope: Hgb 10.2 on admission. Has continued to bleed in ED. Syncope x1, hypotension, likely 2/2 acute blood loss  anemia and hypovolemic shock; undergoing Coumadin reversal. Pt ordered for pRBC, FFP, vitamin K, KCentra, TXE. IVF (will obtain rpt Echo to evaluate EF). Tagged pRBC GI bleeding scan pending. GI consulted (Dr. Marius Ditch). Admit to Stepdown for close monitoring. -Hyperglycemia, T2NIDDM: SSI. Hold home agents. -Cr elevation, CKD III-IV: Baseline CKD (2/2 DM, HTN, aged kidney), Cr 1.43 on present admission. Appears to be at/below baseline. Monitor BMP, avoid nephrotoxins. -Hypoalbuminemia, hypoproteinemia: Prealbumin. -Transaminasemia: AST 42, ALT WNL, APhos WNL. LFTs < 2-3x ULN. c/w Statin. -c/w home meds. Hold Coumadin (obviously). -FEN/GI: NPO for now. -DVT PPx: SCDs, no pharmacological DVT PPx, holding systemic AC. -Code status: Full code. -Disposition: Admission, > 2 midnights.   All the records are reviewed and case discussed with ED provider. Management plans discussed with the patient, family and they are in agreement.  CODE STATUS: Full code.  TOTAL TIME TAKING CARE OF THIS PATIENT: 90 minutes.    Arta Silence M.D on 01/11/2018 at 9:10 AM  Between 7am to 6pm - Pager - [phone]  After 6pm go to [email or web address] - Proofreader  [facility]  Office  [phone]  CC: Primary care physician; [name], DO   Note: This dictation was prepared with Dragon dictation along with smaller phrase technology. Any transcriptional errors that result from this process are unintentional.

## 2018-01-11 NOTE — Progress Notes (Signed)
   01/11/18 1600  Clinical Encounter Type  Visited With Patient and family together  Visit Type Initial  Referral From Nurse  Consult/Referral To Chaplain  Spiritual Encounters  Spiritual Needs Prayer;Emotional  [place] entered room and saw patient awake and alert upon hospital bed. Several members of support system were present including wife of 9 years, daughter and son. Family friend also present. Patient has strong spiritual support system as his pastor visited early Sunday morning and plans to visit later. Patient was talkative and uses humor to help cope with illness. Pleasant. Patient was not interested in AD info but did desire follow up pastoral care visits. [place] provided pastoral care through building rapport, being a non-anxious presence. Represented the holy through prayer upon request. Pastoral visit was appreciated.

## 2018-01-11 NOTE — Consult Note (Signed)
Lucas Darby, MD [address]  [place]  [place], [place] [address]  Main: [phone]  Fax: [phone] Pager: [phone]   Consultation  Referring Provider:     No ref. provider found Primary Care Physician:  [name], DO Primary Gastroenterologist:  Dr. Sherri Sear         Reason for Consultation:  hematochezia  Date of Admission:  01/11/2018 Date of Consultation:  01/11/2018         HPI:   Lucas Caldwell is a 84 y.o. African-American male with history of paroxysmal A. fib on Coumadin, ischemic and nonischemic cardiomyopathy, metabolic syndrome, stage III CKD presented with several episodes of hematochezia.  Patient reports that last night, around 10:30 PM he felt cramping in his lower abdomen followed by a large bloody bowel movement.  He described the stools as dark red to maroon associated with clots.  He had several episodes since arrival to the ER.  He became hypotensive, hemoglobin dropped from 10 on arrival to 8.  Central line was placed, received a liter of normal saline, 2 units of PRBCs, vitamin K, Kcentra.  When the ER physician called me, I suggested a bleeding scan due to ongoing rectal bleeding.  Central line was placed for resuscitation.  When I interviewed the patient, he denied chest pain, dizziness, nausea.  Reports lower abdominal discomfort.  He was hemodynamically stable, second unit of blood was finishing up.  [name] on Coumadin as well as aspirin 81.  His INR in the ER was 2.35.  It was 1.5, 4 days ago.  I recently performed colonoscopy 10 days ago, found to have nonbleeding AVM in the ascending colon that was treated with APC.  He did have severe left-sided diverticulosis.   NSAIDs: None  Antiplts/Anticoagulants/Anti thrombotics: Aspirin 81 and Coumadin for history of cardiomyopathy and A. fib  GI Procedures:  Colonoscopy 01/01/2018 - A single non-bleeding colonic angioectasia. Treated with argon plasma coagulation  (APC). - Diverticulosis in the sigmoid colon and in the descending colon. - The distal rectum and anal verge are normal on retroflexion view. - The examination was otherwise normal. - No specimens collected.  EGD 12/20/2016 Findings: The duodenal bulb and second portion of the duodenum were normal. Diffuse mildly erythematous mucosa without bleeding was found in the entire examined stomach. A single localized, diminutive non-bleeding erosion was found at the incisura. There were stigmata of recent bleeding. A low-grade of narrowing and moderate Schatzki ring (acquired) was found in the lower third of the esophagus, this explains patient's dysphagia, dilation was not performed as pt is on both coumadin and brilinta. The upper third of the esophagus, middle third and lower third of the esophagus and gastroesophageal junction were otherwise normal. No evidence of esophageal cancer  EGD 11/03/2017 - Normal duodenal bulb and second portion of the duodenum. - 1 cm hiatal hernia. - Normal stomach. - Esophagogastric landmarks identified. - Non-obstructing Schatzki ring. Dilated to 86m. - No specimens collected   Past Medical History:  Diagnosis Date  . Arthritis    "probably in his legs" (08/15/2016)  . Blind right eye   . BPH (benign prostatic hyperplasia)   . Breast asymmetry    Left breast is larger, present for several years.  .Marland KitchenCAD (coronary artery disease)    a. Cath in the late 90's - reportedly ok;  b. 2014 s/p stenting x 2 @ [facility]; c 08/19/16 Cath/PCI with DES -> RCA, plan to treat LM 70%  medically. Seen by surgery and felt to be too high risk for CABG.  . Chronic combined systolic (congestive) and diastolic (congestive) heart failure ([place])    a. Previously reduced EF-->50% by echo in 2012;  b. 06/2015 Echo: EF 50-55%  c. 07/2016 Echo: EF 45-50%.  . Chronic kidney disease (CKD), stage IV (severe) ([place])   . CML (chronic myelocytic leukemia) ([place])   . GERD (gastroesophageal reflux  disease)   . Gout   . Hyperlipemia   . Hypertension   . Hypothyroidism   . Iron deficiency anemia   . Migraine    "in the 1960s" (08/15/2016)  . NICM (nonischemic cardiomyopathy) ([place])    a. Previously reduced EF-->50% by echo in 2012;  b. 06/2015 Echo: EF 50-55%, Gr2 DD, mild MR, mildly dil LA, Ao sclerosis, mild TR;  c. 07/2016 Echo: EF 45-50%, ? inf HK, mild MR, mildly dil LA.  [name] PAF (paroxysmal atrial fibrillation) ([facility])    a. ? Dx 2014-->s/p DCCV;  b. CHA2DS2VASc = 6-->chronic coumadin.  . Prostate cancer ([place])   . RBBB   . Type II diabetes mellitus ([place])     Past Surgical History:  Procedure Laterality Date  . CATARACT EXTRACTION W/ INTRAOCULAR LENS  IMPLANT, BILATERAL Bilateral   . COLONOSCOPY WITH PROPOFOL N/A 01/01/2018   Procedure: COLONOSCOPY WITH PROPOFOL;  Surgeon: Lin Landsman, MD;  Location: [facility] ENDOSCOPY;  Service: Gastroenterology;  Laterality: N/A;  . CORONARY ANGIOPLASTY WITH STENT PLACEMENT  09/2012   2 stents  . CORONARY STENT INTERVENTION N/A 08/19/2016   Procedure: Coronary Stent Intervention;  Surgeon: [name], MD;  Location: [facility] LAB;  Service: Cardiovascular;  Laterality: N/A;  . ESOPHAGEAL MANOMETRY N/A 12/16/2017   Procedure: ESOPHAGEAL MANOMETRY (EM);  Surgeon: Lin Landsman, MD;  Location: [facility] ENDOSCOPY;  Service: Gastroenterology;  Laterality: N/A;  . ESOPHAGOGASTRODUODENOSCOPY N/A 12/20/2016   Procedure: ESOPHAGOGASTRODUODENOSCOPY (EGD);  Surgeon: Lin Landsman, MD;  Location: [facility] ENDOSCOPY;  Service: Gastroenterology;  Laterality: N/A;  . ESOPHAGOGASTRODUODENOSCOPY (EGD) WITH PROPOFOL N/A 11/03/2017   Procedure: ESOPHAGOGASTRODUODENOSCOPY (EGD) WITH PROPOFOL;  Surgeon: Lin Landsman, MD;  Location: [facility] ENDOSCOPY;  Service: Gastroenterology;  Laterality: N/A;  . EYE SURGERY    . HERNIA REPAIR     "navel"  . LEFT HEART CATH AND CORONARY ANGIOGRAPHY N/A 08/14/2016   Procedure: Left Heart Cath and Coronary  Angiography;  Surgeon: [name], MD;  Location: [facility];  Service: Cardiovascular;  Laterality: N/A;  . TOE AMPUTATION Right    "big toe"    Prior to Admission medications   Medication Sig Start Date End Date Taking? Authorizing Provider  acetaminophen (TYLENOL) 500 MG tablet Take 500 mg by mouth every 6 (six) hours as needed.   Yes [provider]  albuterol (PROVENTIL HFA;VENTOLIN HFA) 108 (90 Base) MCG/ACT inhaler Inhale 2 puffs into the lungs every 6 (six) hours as needed for wheezing or shortness of breath. 05/13/17  Yes Theora Gianotti, NP  Alcohol Swabs 70 % PADS Use when checking blood sugar up to 3x daily 04/09/17  Yes Karamalegos, [name], DO  aspirin EC 81 MG tablet Take 1 tablet (81 mg total) by mouth daily. 09/02/17  Yes Theora Gianotti, NP  atorvastatin (LIPITOR) 80 MG tablet Take 1 tablet (80 mg total) by mouth daily. 09/24/17  Yes Karamalegos, [name], DO  Dulaglutide (TRULICITY) 8.92 JJ/9.4RD SOPN Inject 0.75 mg into the skin once a week. 10/31/17  Yes [name], DO  enalapril (VASOTEC) 5 MG tablet Take 1 tablet (5 mg total) by mouth daily. 08/06/17  Yes Karamalegos, Alexander J, DO  fluticasone furoate-vilanterol (BREO ELLIPTA) 100-25 MCG/INH AEPB Inhale 1 puff into the lungs daily. 06/13/17  Yes Karamalegos, [name], DO  gabapentin (NEURONTIN) 300 MG capsule Take 2 capsules in morning, 1 capsule in afternoon, 2 capsules at bedtime. In future increase up to max of 3 capsules 3 times daily 12/31/17  Yes Karamalegos, [name], DO  Insulin Pen Needle 31G X 5 MM MISC 31 g by Does not apply route as directed. 04/16/17  Yes [name], NP  loperamide (IMODIUM A-D) 2 MG tablet Take 2 mg by mouth 3 (three) times daily as needed for diarrhea or loose stools.    Yes [provider]  metoprolol succinate (TOPROL-XL) 50 MG 24 hr tablet Take 1 tablet (50 mg total) by mouth every morning. 06/24/17  Yes  Karamalegos, [name], DO  Multiple Vitamins-Minerals (MULTIVITAMIN ADULTS 50+ PO) Take 1 tablet by mouth every morning.   Yes [provider]  nepafenac (NEVANAC) 0.1 % ophthalmic suspension Place 1 drop into both eyes 3 (three) times daily as needed.   Yes [provider]  nitroGLYCERIN (NITROSTAT) 0.4 MG SL tablet Place 1 tablet (0.4 mg total) under the tongue every 5 (five) minutes x 3 doses as needed for chest pain. 10/02/16  Yes End, [name], MD  omeprazole (PRILOSEC) 40 MG capsule Take 1 capsule (40 mg total) by mouth daily before breakfast. 11/03/17 02/01/18 Yes Damiean Lukes, Tally Due, MD  ranitidine (ZANTAC) 300 MG tablet Take 1 tablet (300 mg total) by mouth daily. 07/22/17  Yes Karamalegos, [name], DO  Spacer/Aero-Holding Chambers (OPTICHAMBER ADVANTAGE) MISC 1 each by Other route once. 11/04/14  Yes Ahmed Prima, MD  torsemide (DEMADEX) 20 MG tablet Take 40 mg by mouth daily.    Yes [provider]  warfarin (COUMADIN) 3 MG tablet Take 1 tablet (3 mg total) by mouth as directed. Patient taking differently: Take 1.5-3 mg by mouth as directed. Take 3 mg by mouth daily, except Monday and Friday. On Monday and Friday take 1.5 mg by mouth. 12/25/17  Yes End, [name], MD  allopurinol (ZYLOPRIM) 100 MG tablet Take 100 mg by mouth daily. 02/03/17   [provider]  bosutinib (BOSULIF) 100 MG tablet Take 200 mg by mouth at bedtime. Take with food.     [provider]  fenofibrate 160 MG tablet Take 1 tablet (160 mg total) by mouth daily. Patient not taking: Reported on 01/11/2018 09/24/17   [name], DO  isosorbide mononitrate (IMDUR) 60 MG 24 hr tablet Take 1 tablet (60 mg total) by mouth daily. Patient not taking: Reported on 01/11/2018 03/25/17   End, [name], MD  levothyroxine (SYNTHROID, LEVOTHROID) 50 MCG tablet Take 50 mcg by mouth daily before breakfast.    [provider]  mirtazapine (REMERON) 15 MG  tablet Take 15 mg by mouth at bedtime.    [provider]  tamsulosin (FLOMAX) 0.4 MG CAPS capsule Take 0.4 mg by mouth.    [provider]    Current Facility-Administered Medications:  .  albuterol (PROVENTIL) (2.5 MG/3ML) 0.083% nebulizer solution 2.5 mg, 2.5 mg, Nebulization, Q6H PRN, Jodell Cipro, Prasanna, MD .  fentaNYL (SUBLIMAZE) injection 25 mcg, 25 mcg, Intravenous, Q2H PRN, Sridharan, Prasanna, MD .  fluticasone furoate-vilanterol (BREO ELLIPTA) 100-25 MCG/INH 1 puff, 1 puff, Inhalation, Daily, Sridharan, Prasanna, MD .  insulin aspart (novoLOG) injection 0-15 Units,  0-15 Units, Subcutaneous, TID WC, Sridharan, Prasanna, MD .  insulin aspart (novoLOG) injection 0-5 Units, 0-5 Units, Subcutaneous, QHS, Sridharan, Prasanna, MD .  nitroGLYCERIN (NITROSTAT) SL tablet 0.4 mg, 0.4 mg, Sublingual, Q5 Min x 3 PRN, Jodell Cipro, Prasanna, MD .  [DISCONTINUED] ondansetron (ZOFRAN) tablet 4 mg, 4 mg, Oral, Q6H PRN **OR** ondansetron (ZOFRAN) injection 4 mg, 4 mg, Intravenous, Q6H PRN, Arta Silence, MD, 4 mg at 01/11/18 0949 .  pantoprazole (PROTONIX) injection 40 mg, 40 mg, Intravenous, Q12H, Mody, Sital, MD, 40 mg at 01/11/18 0949 .  sodium chloride 0.9 % bolus 500 mL, 500 mL, Intravenous, Once, Sung, Gretchen Short, MD .  sodium chloride flush (NS) 0.9 % injection 3 mL, 3 mL, Intravenous, Q12H, Jodell Cipro, Prasanna, MD  Current Outpatient Medications:  .  acetaminophen (TYLENOL) 500 MG tablet, Take 500 mg by mouth every 6 (six) hours as needed., Disp: , Rfl:  .  albuterol (PROVENTIL HFA;VENTOLIN HFA) 108 (90 Base) MCG/ACT inhaler, Inhale 2 puffs into the lungs every 6 (six) hours as needed for wheezing or shortness of breath., Disp: 1 Inhaler, Rfl: 2 .  Alcohol Swabs 70 % PADS, Use when checking blood sugar up to 3x daily, Disp: 100 each, Rfl: 11 .  aspirin EC 81 MG tablet, Take 1 tablet (81 mg total) by mouth daily., Disp: 90 tablet, Rfl: 3 .  atorvastatin (LIPITOR) 80 MG tablet,  Take 1 tablet (80 mg total) by mouth daily., Disp: 90 tablet, Rfl: 1 .  Dulaglutide (TRULICITY) 6.75 QG/9.2EF SOPN, Inject 0.75 mg into the skin once a week., Disp: 4 pen, Rfl: 0 .  enalapril (VASOTEC) 5 MG tablet, Take 1 tablet (5 mg total) by mouth daily., Disp: 90 tablet, Rfl: 3 .  fluticasone furoate-vilanterol (BREO ELLIPTA) 100-25 MCG/INH AEPB, Inhale 1 puff into the lungs daily., Disp: 28 each, Rfl: 5 .  gabapentin (NEURONTIN) 300 MG capsule, Take 2 capsules in morning, 1 capsule in afternoon, 2 capsules at bedtime. In future increase up to max of 3 capsules 3 times daily, Disp: 180 capsule, Rfl: 0 .  Insulin Pen Needle 31G X 5 MM MISC, 31 g by Does not apply route as directed., Disp: 100 each, Rfl: 5 .  loperamide (IMODIUM A-D) 2 MG tablet, Take 2 mg by mouth 3 (three) times daily as needed for diarrhea or loose stools. , Disp: , Rfl:  .  metoprolol succinate (TOPROL-XL) 50 MG 24 hr tablet, Take 1 tablet (50 mg total) by mouth every morning., Disp: 90 tablet, Rfl: 3 .  Multiple Vitamins-Minerals (MULTIVITAMIN ADULTS 50+ PO), Take 1 tablet by mouth every morning., Disp: , Rfl:  .  nepafenac (NEVANAC) 0.1 % ophthalmic suspension, Place 1 drop into both eyes 3 (three) times daily as needed., Disp: , Rfl:  .  nitroGLYCERIN (NITROSTAT) 0.4 MG SL tablet, Place 1 tablet (0.4 mg total) under the tongue every 5 (five) minutes x 3 doses as needed for chest pain., Disp: 75 tablet, Rfl: 2 .  omeprazole (PRILOSEC) 40 MG capsule, Take 1 capsule (40 mg total) by mouth daily before breakfast., Disp: 90 capsule, Rfl: 0 .  ranitidine (ZANTAC) 300 MG tablet, Take 1 tablet (300 mg total) by mouth daily., Disp: 90 tablet, Rfl: 3 .  Spacer/Aero-Holding Chambers (OPTICHAMBER ADVANTAGE) MISC, 1 each by Other route once., Disp: 1 each, Rfl: 0 .  torsemide (DEMADEX) 20 MG tablet, Take 40 mg by mouth daily. , Disp: , Rfl:  .  warfarin (COUMADIN) 3 MG tablet, Take 1 tablet (3 mg  total) by mouth as directed. (Patient  taking differently: Take 1.5-3 mg by mouth as directed. Take 3 mg by mouth daily, except Monday and Friday. On Monday and Friday take 1.5 mg by mouth.), Disp: 30 tablet, Rfl: 1 .  allopurinol (ZYLOPRIM) 100 MG tablet, Take 100 mg by mouth daily., Disp: , Rfl: 12 .  bosutinib (BOSULIF) 100 MG tablet, Take 200 mg by mouth at bedtime. Take with food. , Disp: , Rfl:  .  fenofibrate 160 MG tablet, Take 1 tablet (160 mg total) by mouth daily. (Patient not taking: Reported on 01/11/2018), Disp: 90 tablet, Rfl: 1 .  isosorbide mononitrate (IMDUR) 60 MG 24 hr tablet, Take 1 tablet (60 mg total) by mouth daily. (Patient not taking: Reported on 01/11/2018), Disp: 90 tablet, Rfl: 3 .  levothyroxine (SYNTHROID, LEVOTHROID) 50 MCG tablet, Take 50 mcg by mouth daily before breakfast., Disp: , Rfl:  .  mirtazapine (REMERON) 15 MG tablet, Take 15 mg by mouth at bedtime., Disp: , Rfl:  .  tamsulosin (FLOMAX) 0.4 MG CAPS capsule, Take 0.4 mg by mouth., Disp: , Rfl:   Family History  Problem Relation Age of Onset  . Brain cancer Father   . Diabetes Sister   . Heart attack Sister      Social History   Tobacco Use  . Smoking status: Former Smoker    Packs/day: 2.00    Years: 15.00    Pack years: 30.00    Types: Cigarettes  . Smokeless tobacco: Former Systems developer  . Tobacco comment: quit 1979  Substance Use Topics  . Alcohol use: No    Comment: previously drank heavily - quit 1979.  . Drug use: No    Allergies as of 01/11/2018 - Review Complete 01/11/2018  Allergen Reaction Noted  . Clopidogrel Anaphylaxis 07/06/2012  . Ciprofloxacin Itching 10/14/2012  . Mirtazapine Diarrhea 12/31/2017  . Spironolactone  03/29/2014    Review of Systems:    All systems reviewed and negative except where noted in HPI.   Physical Exam:  Vital signs in last 24 hours: Temp:  [97.6 F (36.4 C)-98.6 F (37 C)] 97.7 F (36.5 C) (10/27 0926) Pulse Rate:  [75-100] 86 (10/27 1245) Resp:  [11-23] 18 (10/27 1245) BP:  (89-156)/(54-98) 119/77 (10/27 1245) SpO2:  [98 %-100 %] 100 % (10/27 1245)   General:   Pleasant, cooperative in NAD Head:  Normocephalic and atraumatic. Eyes:   No icterus.   Conjunctiva pale, PERRLA. Ears:  Normal auditory acuity. Neck:  Supple; no masses or thyroidomegaly Lungs: Respirations even and unlabored. Lungs clear to auscultation bilaterally.   No wheezes, crackles, or rhonchi.  Heart:  Regular rate and rhythm;  Without murmur, clicks, rubs or gallops Abdomen:  Soft, nondistended, nontender. Normal bowel sounds. No appreciable masses or hepatomegaly.  No rebound or guarding.  Rectal:  Not performed. Msk:  Symmetrical without gross deformities.  Strength generalized weakness Extremities:  Without edema, cyanosis or clubbing. Neurologic:  Alert and oriented x3;  grossly normal neurologically. Skin:  Intact without significant lesions or rashes. Cervical Nodes:  No significant cervical adenopathy. Psych:  Alert and cooperative. Normal affect.  LAB RESULTS: CBC Latest Ref Rng & Units 01/11/2018 01/11/2018 01/11/2018  WBC 4.0 - 10.5 K/uL - - 8.4  Hemoglobin 13.0 - 17.0 g/dL 9.9(L) 8.1(L) 10.2(L)  Hematocrit 39.0 - 52.0 % - - 32.9(L)  Platelets 150 - 400 K/uL - - 225    BMET BMP Latest Ref Rng & Units 01/11/2018 09/02/2017 05/28/2017  Glucose 70 -  99 mg/dL 263(H) 282(H) 219(H)  BUN 8 - 23 mg/dL 23 31(H) 27(H)  Creatinine 0.61 - 1.24 mg/dL 1.43(H) 1.98(H) 1.73(H)  BUN/Creat Ratio 10 - 24 - 16 -  Sodium 135 - 145 mmol/L 135 132(L) 137  Potassium 3.5 - 5.1 mmol/L 4.5 4.6 3.8  Chloride 98 - 111 mmol/L 104 94(L) 103  CO2 22 - 32 mmol/L _0 Calcium 8.9 - 10.3 mg/dL 8.9 9.3 9.0    LFT Hepatic Function Latest Ref Rng & Units 01/11/2018 01/27/2017 10/16/2016  Total Protein 6.5 - 8.1 g/dL 6.4(L) 8.1 7.2  Albumin 3.5 - 5.0 g/dL 3.2(L) 3.8 3.8  AST 15 - 41 U/L 42(H) 52(H) 34  ALT 0 - 44 U/L 31 40 21  Alk Phosphatase 38 - 126 U/L 52 70 91  Total Bilirubin 0.3 - 1.2 mg/dL  0.5 0.4 <0.2  Bilirubin, Direct 0.00 - 0.40 mg/dL - - 0.08     STUDIES: Dg Chest Portable 1 View  Result Date: 01/11/2018 CLINICAL DATA:  Central line placement. EXAM: PORTABLE CHEST 1 VIEW COMPARISON:  08/27/2017 FINDINGS: Right internal jugular central venous line tip projects in the upper superior vena cava. No pneumothorax. Cardiac silhouette is normal in size. No mediastinal or hilar masses or convincing adenopathy. Clear lungs. IMPRESSION: 1. Right internal jugular central venous line tip projects in the upper superior vena cava. No pneumothorax. 2. No acute cardiopulmonary disease. Electronically Signed   By: Lajean Manes M.D.   On: 01/11/2018 11:05   [place] Ekg Site Rite  Result Date: 01/11/2018 If Site Rite image not attached, placement could not be confirmed due to current cardiac rhythm.     Impression / Plan:   Lucas Caldwell is a 84 y.o. African-American male with metabolic syndrome, paroxysmal A. fib, ischemic and nonischemic cardiomyopathy on aspirin 81 and Coumadin, stage III CKD admitted with hematochezia resulting in hypotension. Patient is currently hemodynamically stable after resuscitation  Hematochezia: Most likely diverticular bleed from left-sided diverticulosis in the setting of anticoagulation EGD in 10/2017 was unremarkable except for Schatzki's ring, and unlikely an upper GI bleed Undergoing bleeding scan, if positive consult vascular for angiogram and embolization If bleeding scan negative, will perform colonoscopy tomorrow Clear liquid diet and bowel prep if bleeding scan negative Continue to hold Coumadin and aspirin 81 Recommend to discontinue long-term aspirin as suggested by cardiology Monitor hemoglobin and INR closely, goal hemoglobin greater than 8, INR less than 1.5  Thank you for involving me in the care of this patient.  Will follow along with you    LOS: 0 days   Sherri Sear, MD  01/11/2018, 12:51 PM   Note: This dictation was prepared  with Dragon dictation along with smaller phrase technology. Any transcriptional errors that result from this process are unintentional.

## 2018-01-11 NOTE — Progress Notes (Signed)
Patient briefly seen and family at bedside.  Hold all anticoagulation Plan for GI bleeding loss study today Start PPI Transfuse 2 units PRBCs Hold plasmapheresis for now Recheck INR  Patient to be admitted to ICU once bed available  Case discussed with family at bedside and nursing.

## 2018-01-11 NOTE — ED Notes (Signed)
Will transport to ICU once NM study done.

## 2018-01-11 NOTE — ED Notes (Signed)
Pt placed on bedpan. Significant blood passed with multiple clots noted. Patient cleaned and perianal care provided. Pt resting comfortably.

## 2018-01-12 ENCOUNTER — Inpatient Hospital Stay
Admit: 2018-01-12 | Discharge: 2018-01-12 | Disposition: A | Discharge status: Home / Self Care | Payer: Medicare HMO | Attending: Internal Medicine | Admitting: Internal Medicine

## 2018-01-12 DIAGNOSIS — D62 Acute posthemorrhagic anemia: Secondary | ICD-10-CM

## 2018-01-12 LAB — CBC
HCT: 24.5 % — ABNORMAL LOW (ref 39.0–52.0)
HEMOGLOBIN: 8.1 g/dL — AB (ref 13.0–17.0)
MCH: 30.1 pg (ref 26.0–34.0)
MCHC: 33.1 g/dL (ref 30.0–36.0)
MCV: 91.1 fL (ref 80.0–100.0)
Platelets: 132 10*3/uL — ABNORMAL LOW (ref 150–400)
RBC: 2.69 MIL/uL — ABNORMAL LOW (ref 4.22–5.81)
RDW: 14.8 % (ref 11.5–15.5)
WBC: 10.7 10*3/uL — ABNORMAL HIGH (ref 4.0–10.5)
nRBC: 0.2 % (ref 0.0–0.2)

## 2018-01-12 LAB — PREPARE FRESH FROZEN PLASMA: Unit division: 0

## 2018-01-12 LAB — FOLATE: FOLATE: 9 ng/mL (ref >5.9)

## 2018-01-12 LAB — HEMOGLOBIN AND HEMATOCRIT, BLOOD
HEMATOCRIT: 26.1 % — AB (ref 39.0–52.0)
HEMATOCRIT: 24.8 % — AB (ref 39.0–52.0)
HEMATOCRIT: 24.5 % — AB (ref 39.0–52.0)
HEMOGLOBIN: 8.5 g/dL — AB (ref 13.0–17.0)
Hemoglobin: 8.1 g/dL — ABNORMAL LOW (ref 13.0–17.0)
Hemoglobin: 8 g/dL — ABNORMAL LOW (ref 13.0–17.0)

## 2018-01-12 LAB — BASIC METABOLIC PANEL
Anion gap: 4 — ABNORMAL LOW (ref 5–15)
BUN: 23 mg/dL (ref 8–23)
CALCIUM: 8 mg/dL — AB (ref 8.9–10.3)
CO2: 22 mmol/L (ref 22–32)
CREATININE: 1.3 mg/dL — AB (ref 0.61–1.24)
Chloride: 110 mmol/L (ref 98–111)
GFR calc Af Amer: 57 mL/min — ABNORMAL LOW (ref >60)
GFR, EST NON AFRICAN AMERICAN: 49 mL/min — AB (ref >60)
Glucose, Bld: 185 mg/dL — ABNORMAL HIGH (ref 70–99)
Potassium: 4 mmol/L (ref 3.5–5.1)
Sodium: 136 mmol/L (ref 135–145)

## 2018-01-12 LAB — ECHOCARDIOGRAM COMPLETE
Height: 66 in
WEIGHTICAEL: 3343.94 [oz_av]

## 2018-01-12 LAB — PROTIME-INR
INR: 1.2
INR: 1.28
PROTHROMBIN TIME: 15.9 s — AB (ref 11.4–15.2)
Prothrombin Time: 15.1 seconds (ref 11.4–15.2)

## 2018-01-12 LAB — GLUCOSE, CAPILLARY
GLUCOSE-CAPILLARY: 195 mg/dL — AB (ref 70–99)
GLUCOSE-CAPILLARY: 199 mg/dL — AB (ref 70–99)
Glucose-Capillary: 167 mg/dL — ABNORMAL HIGH (ref 70–99)
Glucose-Capillary: 165 mg/dL — ABNORMAL HIGH (ref 70–99)

## 2018-01-12 LAB — BPAM FFP
Blood Product Expiration Date: 201911012359
Unit Type and Rh: 8400

## 2018-01-12 LAB — FERRITIN: Ferritin: 194 ng/mL (ref 24–336)

## 2018-01-12 LAB — IRON AND TIBC
IRON: 98 ug/dL (ref 45–182)
Saturation Ratios: 36 % (ref 17.9–39.5)
TIBC: 272 ug/dL (ref 250–450)
UIBC: 174 ug/dL

## 2018-01-12 LAB — PREPARE RBC (CROSSMATCH)

## 2018-01-12 LAB — VITAMIN B12: VITAMIN B 12: 380 pg/mL (ref 180–914)

## 2018-01-12 MED ORDER — POLYETHYLENE GLYCOL 3350 17 GM/SCOOP PO POWD
1.0000 | Freq: Once | ORAL | Status: AC
  Administered 2018-01-12: 255 g via ORAL
  Filled 2018-01-12: qty 255

## 2018-01-12 NOTE — Progress Notes (Signed)
Pt arrived to room 138 from CCU. Family at bedside. Pt alert and oriented. Call bell and phone within reach. No complaints or questions at this time.

## 2018-01-12 NOTE — Progress Notes (Signed)
Name: Lucas Caldwell MRN: [id number] DOB: May 25, 1933     CONSULTATION DATE: 01/11/2018 Subjective & objectives: Remains hemodynamically stable and did not require transfusion last night.  PAST MEDICAL HISTORY :   has a past medical history of Arthritis, Blind right eye, BPH (benign prostatic hyperplasia), Breast asymmetry, CAD (coronary artery disease), Chronic combined systolic (congestive) and diastolic (congestive) heart failure (HCC), Chronic kidney disease (CKD), stage IV (severe) ([place]), CML (chronic myelocytic leukemia) ([place]), GERD (gastroesophageal reflux disease), Gout, Hyperlipemia, Hypertension, Hypothyroidism, Iron deficiency anemia, Migraine, NICM (nonischemic cardiomyopathy) ([place]), PAF (paroxysmal atrial fibrillation) ([place]), Prostate cancer ([place]), RBBB, and Type II diabetes mellitus ([place]).  has a past surgical history that includes Toe amputation (Right); LEFT HEART CATH AND CORONARY ANGIOGRAPHY (N/A, 08/14/2016); Hernia repair; Cataract extraction w/ intraocular lens  implant, bilateral (Bilateral); Coronary angioplasty with stent (09/2012); CORONARY STENT INTERVENTION (N/A, 08/19/2016); Esophagogastroduodenoscopy (N/A, 12/20/2016); Esophagogastroduodenoscopy (egd) with propofol (N/A, 11/03/2017); Esophageal manometry (N/A, 12/16/2017); Eye surgery; and Colonoscopy with propofol (N/A, 01/01/2018). Prior to Admission medications   Medication Sig Start Date End Date Taking? Authorizing Provider  acetaminophen (TYLENOL) 500 MG tablet Take 500 mg by mouth every 6 (six) hours as needed.   Yes [provider]  albuterol (PROVENTIL HFA;VENTOLIN HFA) 108 (90 Base) MCG/ACT inhaler Inhale 2 puffs into the lungs every 6 (six) hours as needed for wheezing or shortness of breath. 05/13/17  Yes Theora Gianotti, NP  Alcohol Swabs 70 % PADS Use when checking blood sugar up to 3x daily 04/09/17  Yes Karamalegos, [name], DO  aspirin EC 81 MG tablet Take 1 tablet (81 mg total) by  mouth daily. 09/02/17  Yes Theora Gianotti, NP  atorvastatin (LIPITOR) 80 MG tablet Take 1 tablet (80 mg total) by mouth daily. 09/24/17  Yes Karamalegos, [name], DO  Dulaglutide (TRULICITY) 3.32 RJ/1.8AC SOPN Inject 0.75 mg into the skin once a week. 10/31/17  Yes Karamalegos, Alexander J, DO  enalapril (VASOTEC) 5 MG tablet Take 1 tablet (5 mg total) by mouth daily. 08/06/17  Yes Karamalegos, Alexander J, DO  fluticasone furoate-vilanterol (BREO ELLIPTA) 100-25 MCG/INH AEPB Inhale 1 puff into the lungs daily. 06/13/17  Yes Karamalegos, [name], DO  gabapentin (NEURONTIN) 300 MG capsule Take 2 capsules in morning, 1 capsule in afternoon, 2 capsules at bedtime. In future increase up to max of 3 capsules 3 times daily 12/31/17  Yes Karamalegos, [name], DO  Insulin Pen Needle 31G X 5 MM MISC 31 g by Does not apply route as directed. 04/16/17  Yes [name], NP  loperamide (IMODIUM A-D) 2 MG tablet Take 2 mg by mouth 3 (three) times daily as needed for diarrhea or loose stools.    Yes [provider]  metoprolol succinate (TOPROL-XL) 50 MG 24 hr tablet Take 1 tablet (50 mg total) by mouth every morning. 06/24/17  Yes Karamalegos, [name], DO  Multiple Vitamins-Minerals (MULTIVITAMIN ADULTS 50+ PO) Take 1 tablet by mouth every morning.   Yes [provider]  nepafenac (NEVANAC) 0.1 % ophthalmic suspension Place 1 drop into both eyes 3 (three) times daily as needed.   Yes [provider]  nitroGLYCERIN (NITROSTAT) 0.4 MG SL tablet Place 1 tablet (0.4 mg total) under the tongue every 5 (five) minutes x 3 doses as needed for chest pain. 10/02/16  Yes End, [name], MD  omeprazole (PRILOSEC) 40 MG capsule Take 1 capsule (40 mg total) by mouth daily before breakfast. 11/03/17 02/01/18 Yes Vanga, Tally Due, MD  ranitidine (ZANTAC) 300 MG tablet Take 1 tablet (300 mg total) by mouth daily. 07/22/17  Yes Karamalegos, [name], DO  Spacer/Aero-Holding  Chambers (OPTICHAMBER ADVANTAGE) MISC 1 each by Other route once. 11/04/14  Yes Ahmed Prima, MD  torsemide (DEMADEX) 20 MG tablet Take 40 mg by mouth daily.    Yes [provider]  warfarin (COUMADIN) 3 MG tablet Take 1 tablet (3 mg total) by mouth as directed. Patient taking differently: Take 1.5-3 mg by mouth as directed. Take 3 mg by mouth daily, except Monday and Friday. On Monday and Friday take 1.5 mg by mouth. 12/25/17  Yes End, [name], MD  allopurinol (ZYLOPRIM) 100 MG tablet Take 100 mg by mouth daily. 02/03/17   [provider]  bosutinib (BOSULIF) 100 MG tablet Take 200 mg by mouth at bedtime. Take with food.     [provider]  fenofibrate 160 MG tablet Take 1 tablet (160 mg total) by mouth daily. Patient not taking: Reported on 01/11/2018 09/24/17   [name], DO  isosorbide mononitrate (IMDUR) 60 MG 24 hr tablet Take 1 tablet (60 mg total) by mouth daily. Patient not taking: Reported on 01/11/2018 03/25/17   End, [name], MD  levothyroxine (SYNTHROID, LEVOTHROID) 50 MCG tablet Take 50 mcg by mouth daily before breakfast.    [provider]  mirtazapine (REMERON) 15 MG tablet Take 15 mg by mouth at bedtime.    [provider]  tamsulosin (FLOMAX) 0.4 MG CAPS capsule Take 0.4 mg by mouth.    [provider]   Allergies  Allergen Reactions  . Clopidogrel Anaphylaxis    Other reaction(s): ANAPHYLAXIS;altered mental status;  (electrolytes "out of wack" and confusion per family; THEY DO NOT REMEMBER ANY OTHER REACTION)- MSB 10/10/15  . Ciprofloxacin Itching  . Mirtazapine Diarrhea    Bad dreams  . Spironolactone     Other reaction(s): Other (See Comments) gynecomastia    FAMILY HISTORY:  family history includes Brain cancer in his father; Diabetes in his sister; Heart attack in his sister. SOCIAL HISTORY:  reports that he has quit smoking. His smoking use included cigarettes. He has a 30.00 pack-year  smoking history. He has quit using smokeless tobacco. He reports that he does not drink alcohol or use drugs.  REVIEW OF SYSTEMS:   Unable to obtain due to critical illness   VITAL SIGNS: Temp:  [97.7 F (36.5 C)-98.3 F (36.8 C)] 98.2 F (36.8 C) (10/28 0400) Pulse Rate:  [74-100] 87 (10/28 0500) Resp:  [11-32] 14 (10/28 0500) BP: (93-139)/(52-98) 135/77 (10/28 0500) SpO2:  [100 %] 100 % (10/28 0500) Weight:  [81.2 kg-94.8 kg] 94.8 kg (10/28 0141)  Physical Examination:  Awake and oriented with no focal neurological deficits On nasal cannula, no distress, able to talk in full sentences, bilateral equal air entry with no adventitious sounds S1 & S2 are audible with no murmur Benign abdomen with feeble peristalsis No leg edema  ASSESSMENT / PLAN:  GI bleeding with coagulopathy due to Coumadin. Bleeding scan-ve.  -PPI -Follow GI and plan for endoscopy today  Symptomatic blood loss anemia -Transfuse to keep hemoglobin more than 8 -Monitor H and H  Hypotension with blood loss responded to volume resuscitation -Optimize volume and consider pressors for systolic blood pressure less than 90 -Monitor hemodynamics  Coagulopathy with Coumadin toxicity, received Kcentra and vitamin K -Monitor coags and keep INR less than 1.5  Coronary artery disease status post PCI, HFpEF 45 to 50% with grade 2 diastolic  dysfunction, right bundle branch block and A. fib on Coumadin for anticoagulation.  No acute ST changes on EKG and no chest pain.  Troponin less than 0.03 -Resume aspirin once not actively bleeding  CKD -Optimize hydration, avoid nephrotoxins, monitor renal panel and urine output  Hypothyroidism.  Free T4 1.1 -Optimize levothyroxine  H/o Chronic myelocytic leukemia  Diabetes mellitus -Optimize glycemic control  Full code  Supportive care  Critical care time 30 minutes

## 2018-01-12 NOTE — Progress Notes (Signed)
*  PRELIMINARY RESULTS* Echocardiogram 2D Echocardiogram has been performed.  Sherrie Sport 01/12/2018, 9:38 AM

## 2018-01-12 NOTE — Progress Notes (Addendum)
Lucas Darby, MD [address]  [place]  [place], [place] [address]  Main: [phone]  Fax: [phone] Pager: [phone]   Subjective: No surgery episodes of hematochezia since last night.  Lucas Caldwell underwent bleeding scan which was negative.  Lucas Caldwell has been hemodynamically stable.  Hemoglobin dropped to 8.5 today.  Patient is kept n.p.o. and EGD and colonoscopy were planned for today.  Lucas Caldwell did not drink bowel prep last night. Patient said Lucas Caldwell wants to go home, shave, take a bath and eat since Lucas Caldwell bleeding stopped.  Lucas Caldwell wife, son and daughter are bedside  Objective: Vital signs in last 24 hours: Vitals:   01/12/18 1000 01/12/18 1100 01/12/18 1205 01/12/18 1300  BP: 121/66 130/65 (!) 126/54 (!) 122/59  Pulse: 89 85 86 84  Resp: 18 17 (!) 21 (!) 23  Temp:      TempSrc:      SpO2: 98% 100% 100% 100%  Weight:      Height:       Weight change:   Intake/Output Summary (Last 24 hours) at 01/12/2018 1404 Last data filed at 01/12/2018 1100 Gross per 24 hour  Intake 1225 ml  Output 850 ml  Net 375 ml     Exam: Heart:: Regular rate and rhythm or S1S2 present Lungs: normal and clear to auscultation Abdomen: soft, nontender, normal bowel sounds   Lab Results: @LABTEST2 @ Micro Results: Recent Results (from the past 240 hour(s))  MRSA PCR Screening     Status: None   Collection Time: 01/11/18  4:41 PM  Result Value Ref Range Status   MRSA by PCR NEGATIVE NEGATIVE Final    Comment:        The GeneXpert MRSA Assay (FDA approved for NASAL specimens only), is one component of a comprehensive MRSA colonization surveillance program. It is not intended to diagnose MRSA infection nor to guide or monitor treatment for MRSA infections. Performed at [facility], [address] [place], [place] [address]    Studies/Results: Nm Gi Blood Loss  Result Date: 01/11/2018 CLINICAL DATA:  Several episodes of rectal bleeding/melena. Near syncope. Patient takes  Coumadin. No GI Surgeries Colonoscopy 2 weeks ago Given 2 units of Blood: Finished at 11am Most Recent Bleeding: 10am EXAM: NUCLEAR MEDICINE GASTROINTESTINAL BLEEDING SCAN TECHNIQUE: Sequential abdominal images were obtained following intravenous administration of Tc-28m labeled red blood cells. RADIOPHARMACEUTICALS:  22.04 mCi Tc-31m pertechnetate in-vitro labeled red cells. COMPARISON:  CT of the abdomen and pelvis on 06/20/2016 FINDINGS: There is expected blood pool activity. No activity identified within bowel loops to indicate the location of GI bleeding. IMPRESSION: Negative GI bleeding scan. Electronically Signed   By: Nolon Nations M.D.   On: 01/11/2018 15:48   Dg Chest Portable 1 View  Result Date: 01/11/2018 CLINICAL DATA:  Central line placement. EXAM: PORTABLE CHEST 1 VIEW COMPARISON:  08/27/2017 FINDINGS: Right internal jugular central venous line tip projects in the upper superior vena cava. No pneumothorax. Cardiac silhouette is normal in size. No mediastinal or hilar masses or convincing adenopathy. Clear lungs. IMPRESSION: 1. Right internal jugular central venous line tip projects in the upper superior vena cava. No pneumothorax. 2. No acute cardiopulmonary disease. Electronically Signed   By: Lajean Manes M.D.   On: 01/11/2018 11:05   [place] Ekg Site Rite  Result Date: 01/11/2018 If Site Rite image not attached, placement could not be confirmed due to current cardiac rhythm.  Medications: I have reviewed the patient's current medications. Scheduled Meds: . fluticasone  furoate-vilanterol  1 puff Inhalation Daily  . insulin aspart  0-15 Units Subcutaneous TID WC  . insulin aspart  0-5 Units Subcutaneous QHS  . pantoprazole (PROTONIX) IV  40 mg Intravenous Q12H  . polyethylene glycol powder  1 Container Oral Once  . sodium chloride flush  3 mL Intravenous Q12H   Continuous Infusions: . sodium chloride 75 mL/hr at 01/12/18 1330   PRN Meds:.albuterol, fentaNYL (SUBLIMAZE)  injection, nitroGLYCERIN, [DISCONTINUED] ondansetron **OR** ondansetron (ZOFRAN) IV   Assessment: Active Problems:   Rectal bleeding  Hematochezia, no active bleeding in the last 12 hours Hemoglobin slowly trending down without evidence of active bleeding.  Lucas Caldwell has been receiving maintenance fluids.  Most likely combination of equilibrium and hemodilution  Plan: I discussed with patient about the implications of endoscopic evaluation to evaluate the source of hematochezia.  I also told him that this will guide [place] whether or not we could restart anticoagulation Patient agreed to undergo EGD/push enteroscopy and colonoscopy tomorrow with bowel prep today Do not recommend long-term aspirin use Recommend cardiology consult to reassess that need for long-term anticoagulation with Coumadin given GI bleed Clear liquid diet today N.p.o. past midnight Can stop maintenance IV fluids  I have discussed alternative options, risks & benefits,  which include, but are not limited to, bleeding, infection, perforation,respiratory complication & drug reaction.  The patient agrees with this plan & written consent will be obtained.     LOS: 1 day   Esli Clements 01/12/2018, 2:04 PM

## 2018-01-12 NOTE — Progress Notes (Signed)
[place] [facility] [place] NAME: Lucas Caldwell    MR#:  [id number]  DATE OF BIRTH:  [birth year]  SUBJECTIVE:  Doing well this morning.  Has not had any more bloody bowel movements.  Denies abdominal pain, nausea, vomiting.  REVIEW OF SYSTEMS:  Review of Systems  Constitutional: Negative for chills and fever.  HENT: Negative for congestion and sore throat.   Eyes: Negative for blurred vision.  Respiratory: Negative for cough and shortness of breath.   Cardiovascular: Negative for chest pain, palpitations and leg swelling.  Gastrointestinal: Positive for blood in stool. Negative for abdominal pain, nausea and vomiting.  Genitourinary: Negative for dysuria and hematuria.  Musculoskeletal: Positive for back pain. Negative for neck pain.  Neurological: Negative for dizziness and headaches.  Psychiatric/Behavioral: Negative for depression. The patient is not nervous/anxious.     DRUG ALLERGIES:   Allergies  Allergen Reactions  . Clopidogrel Anaphylaxis    Other reaction(s): ANAPHYLAXIS;altered mental status;  (electrolytes "out of wack" and confusion per family; THEY DO NOT REMEMBER ANY OTHER REACTION)- MSB 10/10/15  . Ciprofloxacin Itching  . Mirtazapine Diarrhea    Bad dreams  . Spironolactone     Other reaction(s): Other (See Comments) gynecomastia   VITALS:  Blood pressure (!) 154/83, pulse 74, temperature 97.7 F (36.5 C), temperature source Oral, resp. rate 17, height 5\' 6"  (1.676 m), weight 94.8 kg, SpO2 100 %. PHYSICAL EXAMINATION:  Physical Exam  General: Sitting up in bed, in NAD, pleasant HEENT: Normocephalic, atraumatic, no scleral icterus, moist mucous membranes Neck: Supple, full range of motion Respiratory: Clear to auscultation bilaterally, no wheezing, crackles, or rhonchi.  Normal work of breathing. Cardiac: Regular rate and rhythm, no murmurs, rubs, or gallops. GI: Soft, positive bowel sounds, nontender, nondistended,  no rebound or guarding. Musculoskeletal: No edema or cyanosis Neurological: Awake, alert, moving all extremities, no focal deficits Psych: Normal affect, normal thought content LABORATORY PANEL:  Male CBC Recent Labs  Lab 01/12/18 1503  WBC 10.7*  HGB 8.1*  HCT 24.5*  PLT 132*   ------------------------------------------------------------------------------------------------------------------ Chemistries  Recent Labs  Lab 01/11/18 0126 01/11/18 0514 01/12/18 1019  NA 135  --  136  K 4.5  --  4.0  CL 104  --  110  CO2 24  --  22  GLUCOSE 263*  --  185*  BUN 23  --  23  CREATININE 1.43*  --  1.30*  CALCIUM 8.9  --  8.0*  MG  --  1.7  --   AST 42*  --   --   ALT 31  --   --   ALKPHOS 52  --   --   BILITOT 0.5  --   --    RADIOLOGY:  No results found. ASSESSMENT AND PLAN:   GI bleed- Hemoglobin stable, no additional bloody bowel movements.  Received K Centra and vitamin K. -GI following-plan for colonoscopy and endoscopy tomorrow -Start bowel prep -Continue PPI -Continue holding Coumadin  Acute blood loss anemia-stabilized -Transfuse for hemoglobin less than 8 -Recheck hemoglobin in the morning  Hypotension secondary to blood loss- improved and is now normotensive -Monitor  Paroxysmal atrial fibrillation-has been in NSR -Holding Coumadin due to GI bleed -Cardiology consult to reassess need for long-term anticoagulation with Coumadin  CAD status post stent-stable, no active chest pain this admission.  CKD III-creatinine at baseline -Monitor -Avoid nephrotoxic agents  Hypothyroidism-stable -Continue Synthroid  Type 2 diabetes-sugars moderately well controlled -Continue sliding-scale insulin  All the records are reviewed and case discussed with Care Management/Social Worker. Management plans discussed with the patient, family and they are in agreement.  CODE STATUS: Full Code  TOTAL TIME TAKING CARE OF THIS PATIENT: 35 minutes.   More than 50% of  the time was spent in counseling/coordination of care: YES  POSSIBLE D/C IN 2-3 DAYS, DEPENDING ON CLINICAL CONDITION.   Berna Spare Mayo M.D on 01/12/2018 at 5:21 PM  Between 7am to 6pm - Pager - [phone]  After 6pm go to [email or web address] - Proofreader  [facility]  Office  [phone]  CC: Primary care physician; [name], DO  Note: This dictation was prepared with Dragon dictation along with smaller phrase technology. Any transcriptional errors that result from this process are unintentional.

## 2018-01-12 NOTE — Progress Notes (Signed)
Report called to Janett Billow, RN on 1A. Pt transferred to room 138 via wheelchair with all belongings. Family aware.

## 2018-01-13 ENCOUNTER — Encounter: Payer: Self-pay | Admitting: Anesthesiology

## 2018-01-13 ENCOUNTER — Encounter
Admission: EM | Disposition: A | Discharge status: Home / Self Care | Payer: Self-pay | Source: Home / Self Care | Attending: Internal Medicine

## 2018-01-13 ENCOUNTER — Inpatient Hospital Stay: Payer: Medicare HMO | Admitting: Anesthesiology

## 2018-01-13 DIAGNOSIS — K269 Duodenal ulcer, unspecified as acute or chronic, without hemorrhage or perforation: Secondary | ICD-10-CM

## 2018-01-13 DIAGNOSIS — K222 Esophageal obstruction: Secondary | ICD-10-CM

## 2018-01-13 DIAGNOSIS — N183 Chronic kidney disease, stage 3 (moderate): Secondary | ICD-10-CM

## 2018-01-13 DIAGNOSIS — I480 Paroxysmal atrial fibrillation: Secondary | ICD-10-CM

## 2018-01-13 DIAGNOSIS — I2510 Atherosclerotic heart disease of native coronary artery without angina pectoris: Secondary | ICD-10-CM

## 2018-01-13 DIAGNOSIS — K259 Gastric ulcer, unspecified as acute or chronic, without hemorrhage or perforation: Secondary | ICD-10-CM

## 2018-01-13 DIAGNOSIS — K921 Melena: Secondary | ICD-10-CM

## 2018-01-13 DIAGNOSIS — K633 Ulcer of intestine: Secondary | ICD-10-CM

## 2018-01-13 DIAGNOSIS — K922 Gastrointestinal hemorrhage, unspecified: Secondary | ICD-10-CM

## 2018-01-13 HISTORY — PX: COLONOSCOPY: SHX5424

## 2018-01-13 HISTORY — PX: ESOPHAGOGASTRODUODENOSCOPY: SHX5428

## 2018-01-13 LAB — GLUCOSE, CAPILLARY
GLUCOSE-CAPILLARY: 133 mg/dL — AB (ref 70–99)
GLUCOSE-CAPILLARY: 112 mg/dL — AB (ref 70–99)
Glucose-Capillary: 171 mg/dL — ABNORMAL HIGH (ref 70–99)
Glucose-Capillary: 167 mg/dL — ABNORMAL HIGH (ref 70–99)

## 2018-01-13 LAB — HEMOGLOBIN AND HEMATOCRIT, BLOOD
HEMATOCRIT: 24.9 % — AB (ref 39.0–52.0)
Hemoglobin: 8.4 g/dL — ABNORMAL LOW (ref 13.0–17.0)

## 2018-01-13 LAB — CBC
HEMATOCRIT: 22.2 % — AB (ref 39.0–52.0)
Hemoglobin: 7.4 g/dL — ABNORMAL LOW (ref 13.0–17.0)
MCH: 30.3 pg (ref 26.0–34.0)
MCHC: 33.3 g/dL (ref 30.0–36.0)
MCV: 91 fL (ref 80.0–100.0)
PLATELETS: 127 10*3/uL — AB (ref 150–400)
RBC: 2.44 MIL/uL — ABNORMAL LOW (ref 4.22–5.81)
RDW: 14.6 % (ref 11.5–15.5)
WBC: 9.1 10*3/uL (ref 4.0–10.5)
nRBC: 0.5 % — ABNORMAL HIGH (ref 0.0–0.2)

## 2018-01-13 LAB — PROTIME-INR
INR: 1.28
Prothrombin Time: 15.9 seconds — ABNORMAL HIGH (ref 11.4–15.2)

## 2018-01-13 SURGERY — EGD (ESOPHAGOGASTRODUODENOSCOPY)
Anesthesia: General

## 2018-01-13 SURGERY — ENTEROSCOPY
Anesthesia: General

## 2018-01-13 MED ORDER — LIDOCAINE HCL (CARDIAC) PF 100 MG/5ML IV SOSY
PREFILLED_SYRINGE | INTRAVENOUS | Status: DC | PRN
  Administered 2018-01-13: 50 mg via INTRAVENOUS

## 2018-01-13 MED ORDER — PROPOFOL 500 MG/50ML IV EMUL
INTRAVENOUS | Status: AC
  Filled 2018-01-13: qty 50

## 2018-01-13 MED ORDER — METOPROLOL SUCCINATE ER 50 MG PO TB24
50.0000 mg | ORAL_TABLET | Freq: Every day | ORAL | Status: DC
  Administered 2018-01-14: 50 mg via ORAL
  Filled 2018-01-13: qty 1

## 2018-01-13 MED ORDER — IPRATROPIUM-ALBUTEROL 0.5-2.5 (3) MG/3ML IN SOLN
RESPIRATORY_TRACT | Status: AC
  Filled 2018-01-13: qty 3

## 2018-01-13 MED ORDER — SODIUM CHLORIDE 0.9 % IV SOLN
INTRAVENOUS | Status: DC | PRN
  Administered 2018-01-13: 15:00:00 via INTRAVENOUS

## 2018-01-13 MED ORDER — ASPIRIN EC 81 MG PO TBEC
81.0000 mg | DELAYED_RELEASE_TABLET | Freq: Every day | ORAL | Status: DC
  Administered 2018-01-14: 81 mg via ORAL
  Filled 2018-01-13: qty 1

## 2018-01-13 MED ORDER — IPRATROPIUM-ALBUTEROL 0.5-2.5 (3) MG/3ML IN SOLN
3.0000 mL | Freq: Once | RESPIRATORY_TRACT | Status: AC
  Administered 2018-01-13: 3 mL via RESPIRATORY_TRACT

## 2018-01-13 MED ORDER — PROPOFOL 10 MG/ML IV BOLUS
INTRAVENOUS | Status: DC | PRN
  Administered 2018-01-13: 10 mg via INTRAVENOUS
  Administered 2018-01-13: 50 mg via INTRAVENOUS

## 2018-01-13 MED ORDER — PROPOFOL 500 MG/50ML IV EMUL
INTRAVENOUS | Status: DC | PRN
  Administered 2018-01-13: 150 ug/kg/min via INTRAVENOUS

## 2018-01-13 MED ORDER — SODIUM CHLORIDE 0.9% IV SOLUTION
Freq: Once | INTRAVENOUS | Status: AC
  Administered 2018-01-13: 10:00:00 via INTRAVENOUS

## 2018-01-13 NOTE — Anesthesia Post-op Follow-up Note (Signed)
Anesthesia QCDR form completed.        

## 2018-01-13 NOTE — Op Note (Signed)
[facility] Patient Name: Lucas Caldwell Procedure Date: 01/13/2018 3:40 PM MRN: [id number] Account #: [id number] Date of Birth: 1933/04/20 Admit Type: Inpatient Age: 84 Room: [facility] ENDO ROOM 1 Gender: Male Note Status: Finalized Procedure:            Colonoscopy Indications:          Hematochezia, , Melena, Acute post hemorrhagic anemia,                        recent colonoscopy, last week with APC of AVM in [facility] Providers:            Lin Landsman MD, MD Medicines:            Monitored Anesthesia Care Complications:        No immediate complications. Estimated blood loss: None. Procedure:            Pre-Anesthesia Assessment:                       - Prior to the procedure, a History and Physical was                        performed, and patient medications and allergies were                        reviewed. The patient is competent. The risks and                        benefits of the procedure and the sedation options and                        risks were discussed with the patient. All questions                        were answered and informed consent was obtained.                        Patient identification and proposed procedure were                        verified by the physician, the nurse, the                        anesthesiologist, the anesthetist and the technician in                        the pre-procedure area in the procedure room in the                        endoscopy suite. Mental Status Examination: alert and                        oriented. Airway Examination: normal oropharyngeal                        airway and neck mobility. Respiratory Examination:                        clear to auscultation. CV Examination: normal.  Prophylactic Antibiotics: The patient does not require                        prophylactic antibiotics. Prior Anticoagulants: The                        patient has taken  Coumadin (warfarin), last dose was 4                        days prior to procedure. ASA Grade Assessment: III - A                        patient with severe systemic disease. After reviewing                        the risks and benefits, the patient was deemed in                        satisfactory condition to undergo the procedure. The                        anesthesia plan was to use monitored anesthesia care                        (MAC). Immediately prior to administration of                        medications, the patient was re-assessed for adequacy                        to receive sedatives. The heart rate, respiratory rate,                        oxygen saturations, blood pressure, adequacy of                        pulmonary ventilation, and response to care were                        monitored throughout the procedure. The physical status                        of the patient was re-assessed after the procedure.                       After obtaining informed consent, the colonoscope was                        passed under direct vision. Throughout the procedure,                        the patient's blood pressure, pulse, and oxygen                        saturations were monitored continuously. The                        Colonoscope was introduced through the anus and  advanced to the the cecum, identified by appendiceal                        orifice and ileocecal valve. The colonoscopy was                        performed with moderate difficulty due to significant                        looping and the patient's body habitus. Successful                        completion of the procedure was aided by applying                        abdominal pressure. The patient tolerated the procedure                        well. The quality of the bowel preparation was adequate. Findings:      The perianal and digital rectal examinations were normal. Pertinent        negatives include normal sphincter tone and no palpable rectal lesions.      A moderate amount of liquid stool was found in the entire colon.      A single (solitary) seven mm ulcer was found in the proximal ascending       colon from recent APC of the AVM. Oozing was present. Stigmata of recent       bleeding were present. For hemostasis, two hemostatic clips were       successfully placed (MR conditional). There was no bleeding at the end       of the procedure.      The exam was otherwise without abnormality. Impression:           - Stool in the entire examined colon.                       - A single (solitary) ulcer in the proximal ascending                        colon. Clips (MR conditional) were placed.                       - The examination was otherwise normal.                       - No specimens collected. Recommendation:       - Return patient to hospital ward for ongoing care.                       - Cardiac diet today.                       - Continue present medications.                       - Pt at high risk for rebleeding from ulcers on                        anticoagulation. Would allow atleast 1-2weeks to  promote healing of the ulcers before restarting                        anticoagulation                       - Ok to restart aspirin Procedure Code(s):    --- Professional ---                       [phone], Colonoscopy, flexible; with control of bleeding,                        any method Diagnosis Code(s):    --- Professional ---                       K63.3, Ulcer of intestine                       K92.1, Melena (includes Hematochezia)                       D62, Acute posthemorrhagic anemia CPT copyright 2018 American Medical Association. All rights reserved. The codes documented in this report are preliminary and upon coder review may  be revised to meet current compliance requirements. Dr. Ulyess Mort Lin Landsman MD, MD 01/13/2018  4:07:14 PM This report has been signed electronically. Number of Addenda: 0 Note Initiated On: 01/13/2018 3:40 PM Scope Withdrawal Time: 0 hours 12 minutes 54 seconds  Total Procedure Duration: 0 hours 16 minutes 34 seconds       [facility]

## 2018-01-13 NOTE — Op Note (Addendum)
[facility] Gastroenterology Patient Name: Lucas Caldwell Procedure Date: 01/13/2018 2:25 PM MRN: [id number] Account #: [id number] Date of Birth: 1933-10-15 Admit Type: Inpatient Age: 84 Room: [facility] ENDO ROOM 1 Gender: Male Note Status: Finalized Procedure:            Small bowel enteroscopy Indications:          Hematochezia, Melena Providers:            Lin Landsman MD, MD Medicines:            Monitored Anesthesia Care Complications:        No immediate complications. Estimated blood loss:                        Minimal. Procedure:            Pre-Anesthesia Assessment:                       - Prior to the procedure, a History and Physical was                        performed, and patient medications and allergies were                        reviewed. The patient is competent. The risks and                        benefits of the procedure and the sedation options and                        risks were discussed with the patient. All questions                        were answered and informed consent was obtained.                        Patient identification and proposed procedure were                        verified by the physician, the nurse, the                        anesthesiologist, the anesthetist and the technician in                        the pre-procedure area in the procedure room in the                        endoscopy suite. Mental Status Examination: alert and                        oriented. Airway Examination: normal oropharyngeal                        airway and neck mobility. Respiratory Examination:                        clear to auscultation. CV Examination: normal.                        Prophylactic Antibiotics: The patient does  not require                        prophylactic antibiotics. Prior Anticoagulants: The                        patient has taken Coumadin (warfarin), last dose was 4                        days prior to  procedure. ASA Grade Assessment: III - A                        patient with severe systemic disease. After reviewing                        the risks and benefits, the patient was deemed in                        satisfactory condition to undergo the procedure. The                        anesthesia plan was to use monitored anesthesia care                        (MAC). Immediately prior to administration of                        medications, the patient was re-assessed for adequacy                        to receive sedatives. The heart rate, respiratory rate,                        oxygen saturations, blood pressure, adequacy of                        pulmonary ventilation, and response to care were                        monitored throughout the procedure. The physical status                        of the patient was re-assessed after the procedure.                       After obtaining informed consent, the endoscope was                        passed under direct vision. Throughout the procedure,                        the patient's blood pressure, pulse, and oxygen                        saturations were monitored continuously. The                        Colonoscope was introduced through the mouth and                        advanced  to the proximal jejunum. The small bowel                        enteroscopy was accomplished without difficulty. The                        patient tolerated the procedure fairly well. Findings:      There was no evidence of significant pathology in the proximal jejunum.      Many non-bleeding linear duodenal ulcers with a clean ulcer base       (Forrest Class III) were found in the duodenal bulb and in the second       portion of the duodenum concerning for ischemia. The largest lesion was       8 mm in largest dimension. Biopsies were taken with a cold forceps for       histology. Estimated blood loss was minimal.      Few non-bleeding superficial  gastric ulcers with a clean ulcer base       (Forrest Class III) were found in the prepyloric region of the stomach.       The largest lesion was 5 mm in largest dimension.      A non-obstructing Schatzki ring was found at the gastroesophageal       junction.      The gastroesophageal junction and examined esophagus were normal. Impression:           - The examined portion of the jejunum was normal.                       - Multiple non-bleeding duodenal ulcers with a clean                        ulcer base (Forrest Class III) Biopsied , likely source                        of bleeding.                       - Non-bleeding gastric ulcers with a clean ulcer base                        (Forrest Class III).                       - Non-obstructing Schatzki ring.                       - Normal gastroesophageal junction and esophagus. Recommendation:       - Pt at high risk for rebleeding from ulcers on                        anticoagulation. Would allow atleast 1-2weeks to                        promote healing of the ulcers                       - Long term PPI BID                       - Ok to restart [id number]                       -  Proceed with colonoscopy as scheduled                       - See colonoscopy report Procedure Code(s):    --- Professional ---                       [phone], Small intestinal endoscopy, enteroscopy beyond                        second portion of duodenum, not including ileum; with                        biopsy, single or multiple Diagnosis Code(s):    --- Professional ---                       K26.9, Duodenal ulcer, unspecified as acute or chronic,                        without hemorrhage or perforation                       K25.9, Gastric ulcer, unspecified as acute or chronic,                        without hemorrhage or perforation                       K22.2, Esophageal obstruction                       K92.1, Melena (includes Hematochezia) CPT copyright  2018 American Medical Association. All rights reserved. The codes documented in this report are preliminary and upon coder review may  be revised to meet current compliance requirements. Dr. Ulyess Mort Lin Landsman MD, MD 01/13/2018 3:38:30 PM This report has been signed electronically. Number of Addenda: 0 Note Initiated On: 01/13/2018 2:25 PM      [facility]

## 2018-01-13 NOTE — Progress Notes (Signed)
Hemoglobin 7.4 this am. Dr Jodell Cipro made aware. Order to recheck hemoglobin tomorrow morning obtained.

## 2018-01-13 NOTE — Consult Note (Signed)
Cardiology Consultation:   Patient ID: Lucas Caldwell MRN: [id number]; DOB: 09-06-1933  Admit date: 01/11/2018 Date of Consult: 01/13/2018  Primary Care Provider: [name], DO Primary Cardiologist: [name], MD Primary Electrophysiologist:  None    Patient Profile:   Lucas Caldwell is a 84 y.o. male with a hx of coronary artery disease, mixed ischemic and nonischemic cardiomyopathy, hypertension, hyperlipidemia, type 2 diabetes mellitus, paroxysmal atrial fibrillation, chronic kidney disease stage III, CML, iron deficiency anemia, BPH, and hypothyroidism, who is being seen today for the evaluation of anemia in the setting of GI bleed and chronic anticoabulation at the request of Dr. Brett Albino.  History of Present Illness:   Lucas Caldwell was in his usual state of health until Saturday night (3 days ago).  He began noticing cramping and rumbling in his abdomen and subsequently had several bloody bowel movements.  He presented to the ED, where he was noted to have several more blood bowel movements.  Hemoglobin also dropped from baseline of 11.9 to 10.2 on initial presentation and 8.1 6 hours later.  He has received 2 units of PRBC's, though hemoglobin continues to trend down and is 7.6 this morning.  Lucas Caldwell denies chest pain, shortness of breath, and palpitations.  He endorses lightheadedness since his rectal bleeding began.  He also notes soreness in his legs.  He reports having been compliant with his medications, including ASA and warfarin (held since admission).  INR was subtherapeutic on last outpatient check on 01/07/18, for which he was advised to increase his daily warfarin dosing for 2 days.  INR at the time of presentation was 2.4.  He received [facility] for reversal of his INR.  Aspirin has also been held.  Upper and lower endoscopy is planned for today.  Past Medical History:  Diagnosis Date  . Arthritis    "probably in his legs" (08/15/2016)  . Blind  right eye   . BPH (benign prostatic hyperplasia)   . Breast asymmetry    Left breast is larger, present for several years.  [name] CAD (coronary artery disease)    a. Cath in the late 90's - reportedly ok;  b. 2014 s/p stenting x 2 @ [facility]; c 08/19/16 Cath/PCI with DES -> RCA, plan to treat LM 70% medically. Seen by surgery and felt to be too high risk for CABG.  . Chronic combined systolic (congestive) and diastolic (congestive) heart failure ([place])    a. Previously reduced EF-->50% by echo in 2012;  b. 06/2015 Echo: EF 50-55%  c. 07/2016 Echo: EF 45-50%.  . Chronic kidney disease (CKD), stage IV (severe) ([place])   . CML (chronic myelocytic leukemia) ([place])   . GERD (gastroesophageal reflux disease)   . Gout   . Hyperlipemia   . Hypertension   . Hypothyroidism   . Iron deficiency anemia   . Migraine    "in the 1960s" (08/15/2016)  . NICM (nonischemic cardiomyopathy) ([place])    a. Previously reduced EF-->50% by echo in 2012;  b. 06/2015 Echo: EF 50-55%, Gr2 DD, mild MR, mildly dil LA, Ao sclerosis, mild TR;  c. 07/2016 Echo: EF 45-50%, ? inf HK, mild MR, mildly dil LA.  [name] PAF (paroxysmal atrial fibrillation) ([facility])    a. ? Dx 2014-->s/p DCCV;  b. CHA2DS2VASc = 6-->chronic coumadin.  . Prostate cancer ([place])   . RBBB   . Type II diabetes mellitus ([place])     Past Surgical History:  Procedure Laterality Date  . CATARACT EXTRACTION W/ INTRAOCULAR  LENS  IMPLANT, BILATERAL Bilateral   . COLONOSCOPY WITH PROPOFOL N/A 01/01/2018   Procedure: COLONOSCOPY WITH PROPOFOL;  Surgeon: Lin Landsman, MD;  Location: [facility] ENDOSCOPY;  Service: Gastroenterology;  Laterality: N/A;  . CORONARY ANGIOPLASTY WITH STENT PLACEMENT  09/2012   2 stents  . CORONARY STENT INTERVENTION N/A 08/19/2016   Procedure: Coronary Stent Intervention;  Surgeon: [name], MD;  Location: [place] CV LAB;  Service: Cardiovascular;  Laterality: N/A;  . ESOPHAGEAL MANOMETRY N/A 12/16/2017   Procedure: ESOPHAGEAL MANOMETRY (EM);   Surgeon: Lin Landsman, MD;  Location: [facility] ENDOSCOPY;  Service: Gastroenterology;  Laterality: N/A;  . ESOPHAGOGASTRODUODENOSCOPY N/A 12/20/2016   Procedure: ESOPHAGOGASTRODUODENOSCOPY (EGD);  Surgeon: Lin Landsman, MD;  Location: [facility] ENDOSCOPY;  Service: Gastroenterology;  Laterality: N/A;  . ESOPHAGOGASTRODUODENOSCOPY (EGD) WITH PROPOFOL N/A 11/03/2017   Procedure: ESOPHAGOGASTRODUODENOSCOPY (EGD) WITH PROPOFOL;  Surgeon: Lin Landsman, MD;  Location: [facility] ENDOSCOPY;  Service: Gastroenterology;  Laterality: N/A;  . EYE SURGERY    . HERNIA REPAIR     "navel"  . LEFT HEART CATH AND CORONARY ANGIOGRAPHY N/A 08/14/2016   Procedure: Left Heart Cath and Coronary Angiography;  Surgeon: [name], MD;  Location: [place] CV LAB;  Service: Cardiovascular;  Laterality: N/A;  . TOE AMPUTATION Right    "big toe"     Home Medications:  Prior to Admission medications   Medication Sig Start Date Clint Biello Date Taking? Authorizing Provider  acetaminophen (TYLENOL) 500 MG tablet Take 500 mg by mouth every 6 (six) hours as needed.   Yes [provider]  albuterol (PROVENTIL HFA;VENTOLIN HFA) 108 (90 Base) MCG/ACT inhaler Inhale 2 puffs into the lungs every 6 (six) hours as needed for wheezing or shortness of breath. 05/13/17  Yes Theora Gianotti, NP  Alcohol Swabs 70 % PADS Use when checking blood sugar up to 3x daily 04/09/17  Yes Karamalegos, [name], DO  aspirin EC 81 MG tablet Take 1 tablet (81 mg total) by mouth daily. 09/02/17  Yes Theora Gianotti, NP  atorvastatin (LIPITOR) 80 MG tablet Take 1 tablet (80 mg total) by mouth daily. 09/24/17  Yes Karamalegos, [name], DO  Dulaglutide (TRULICITY) 0.10 XN/2.3FT SOPN Inject 0.75 mg into the skin once a week. 10/31/17  Yes Karamalegos, Alexander J, DO  enalapril (VASOTEC) 5 MG tablet Take 1 tablet (5 mg total) by mouth daily. 08/06/17  Yes Karamalegos, Alexander J, DO  fluticasone furoate-vilanterol  (BREO ELLIPTA) 100-25 MCG/INH AEPB Inhale 1 puff into the lungs daily. 06/13/17  Yes Karamalegos, [name], DO  gabapentin (NEURONTIN) 300 MG capsule Take 2 capsules in morning, 1 capsule in afternoon, 2 capsules at bedtime. In future increase up to max of 3 capsules 3 times daily 12/31/17  Yes Karamalegos, [name], DO  Insulin Pen Needle 31G X 5 MM MISC 31 g by Does not apply route as directed. 04/16/17  Yes [name], NP  loperamide (IMODIUM A-D) 2 MG tablet Take 2 mg by mouth 3 (three) times daily as needed for diarrhea or loose stools.    Yes [provider]  metoprolol succinate (TOPROL-XL) 50 MG 24 hr tablet Take 1 tablet (50 mg total) by mouth every morning. 06/24/17  Yes Karamalegos, [name], DO  Multiple Vitamins-Minerals (MULTIVITAMIN ADULTS 50+ PO) Take 1 tablet by mouth every morning.   Yes [provider]  nepafenac (NEVANAC) 0.1 % ophthalmic suspension Place 1 drop into both eyes 3 (three) times daily as needed.   Yes [provider]  nitroGLYCERIN (NITROSTAT) 0.4 MG SL tablet Place 1 tablet (0.4 mg total) under the tongue every 5 (five) minutes x 3 doses as needed for chest pain. 10/02/16  Yes Javonni Macke, [name], MD  omeprazole (PRILOSEC) 40 MG capsule Take 1 capsule (40 mg total) by mouth daily before breakfast. 11/03/17 02/01/18 Yes Vanga, Tally Due, MD  ranitidine (ZANTAC) 300 MG tablet Take 1 tablet (300 mg total) by mouth daily. 07/22/17  Yes Karamalegos, [name], DO  Spacer/Aero-Holding Chambers (OPTICHAMBER ADVANTAGE) MISC 1 each by Other route once. 11/04/14  Yes Ahmed Prima, MD  torsemide (DEMADEX) 20 MG tablet Take 40 mg by mouth daily.    Yes [provider]  warfarin (COUMADIN) 3 MG tablet Take 1 tablet (3 mg total) by mouth as directed. Patient taking differently: Take 1.5-3 mg by mouth as directed. Take 3 mg by mouth daily, except Monday and Friday. On Monday and Friday take 1.5 mg by mouth. 12/25/17  Yes Baily Serpe,  [name], MD  allopurinol (ZYLOPRIM) 100 MG tablet Take 100 mg by mouth daily. 02/03/17   [provider]  bosutinib (BOSULIF) 100 MG tablet Take 200 mg by mouth at bedtime. Take with food.     [provider]  fenofibrate 160 MG tablet Take 1 tablet (160 mg total) by mouth daily. Patient not taking: Reported on 01/11/2018 09/24/17   [name], DO  isosorbide mononitrate (IMDUR) 60 MG 24 hr tablet Take 1 tablet (60 mg total) by mouth daily. Patient not taking: Reported on 01/11/2018 03/25/17   Elija Mccamish, [name], MD  levothyroxine (SYNTHROID, LEVOTHROID) 50 MCG tablet Take 50 mcg by mouth daily before breakfast.    [provider]  mirtazapine (REMERON) 15 MG tablet Take 15 mg by mouth at bedtime.    [provider]  tamsulosin (FLOMAX) 0.4 MG CAPS capsule Take 0.4 mg by mouth.    [provider]    Inpatient Medications: Scheduled Meds: . sodium chloride   Intravenous Once  . fluticasone furoate-vilanterol  1 puff Inhalation Daily  . insulin aspart  0-15 Units Subcutaneous TID WC  . insulin aspart  0-5 Units Subcutaneous QHS  . pantoprazole (PROTONIX) IV  40 mg Intravenous Q12H  . sodium chloride flush  3 mL Intravenous Q12H   Continuous Infusions:  PRN Meds: albuterol, fentaNYL (SUBLIMAZE) injection, nitroGLYCERIN, [DISCONTINUED] ondansetron **OR** ondansetron (ZOFRAN) IV  Allergies:    Allergies  Allergen Reactions  . Clopidogrel Anaphylaxis    Other reaction(s): ANAPHYLAXIS;altered mental status;  (electrolytes "out of wack" and confusion per family; THEY DO NOT REMEMBER ANY OTHER REACTION)- MSB 10/10/15  . Ciprofloxacin Itching  . Mirtazapine Diarrhea    Bad dreams  . Spironolactone     Other reaction(s): Other (See Comments) gynecomastia    Social History:   Social History   Socioeconomic History  . Marital status: Married    Spouse name: Not on file  . Number of children: Not on file  . Years of education:  Not on file  . Highest education level: 7th grade  Occupational History  . Occupation: Retired    Comment: Owned his own [employer].  Social Needs  . Financial resource strain: Hard  . Food insecurity:    Worry: Sometimes true    Inability: Never true  . Transportation needs:    Medical: No    Non-medical: No  Tobacco Use  . Smoking status: Former Smoker    Packs/day: 2.00    Years: 15.00  Pack years: 30.00    Types: Cigarettes  . Smokeless tobacco: Former Systems developer  . Tobacco comment: quit 1979  Substance and Sexual Activity  . Alcohol use: No    Comment: previously drank heavily - quit 1979.  . Drug use: No  . Sexual activity: Never  Lifestyle  . Physical activity:    Days per week: 0 days    Minutes per session: 0 min  . Stress: Not at all  Relationships  . Social connections:    Talks on phone: More than three times a week    Gets together: More than three times a week    Attends religious service: More than 4 times per year    Active member of club or organization: No    Attends meetings of clubs or organizations: Never    Relationship status: Married  . Intimate partner violence:    Fear of current or ex partner: No    Emotionally abused: No    Physically abused: No    Forced sexual activity: No  Other Topics Concern  . Not on file  Social History Narrative   Working on transportation truck part time     Family History:   Family History  Problem Relation Age of Onset  . Brain cancer Father   . Diabetes Sister   . Heart attack Sister      ROS:  Please see the history of present illness. All other ROS reviewed and negative.     Physical Exam/Data:   Vitals:   01/12/18 1947 01/12/18 2343 01/13/18 0526 01/13/18 0741  BP: (!) 147/73 (!) 156/80 (!) 144/70 (!) 141/87  Pulse: 81 90 93 (!) 101  Resp: 17 17 16 18   Temp: 97.9 F (36.6 C) 98.7 F (37.1 C) 98.9 F (37.2 C) 98.8 F (37.1 C)  TempSrc: Oral Oral Oral Oral  SpO2: 100% 100% 100% 100%    Weight:      Height:        Intake/Output Summary (Last 24 hours) at 01/13/2018 0829 Last data filed at 01/12/2018 1616 Gross per 24 hour  Intake 692.82 ml  Output 600 ml  Net 92.82 ml   Filed Weights   01/11/18 1600 01/12/18 0141  Weight: 81.2 kg 94.8 kg   Body mass index is 33.73 kg/m.  General:  Well nourished, well developed, in no acute distress.  Wife and son are at the bedside. HEENT: normal Lymph: no adenopathy Neck: no JVD.  Right IJ central line in place. Endocrine:  No thryomegaly Vascular: No carotid bruits; FA pulses 2+ bilaterally without bruits  Cardiac:  Irregularly irregular with 1/6 systolic murmur. Lungs:  clear to auscultation bilaterally, no wheezing, rhonchi or rales  Abd: soft, nontender, no hepatomegaly  Ext: no edema Musculoskeletal:  No deformities, BUE and BLE strength normal and equal Skin: warm and dry  Neuro:  CNs 3-12 intact, no focal abnormalities noted Psych:  Normal affect   EKG:  The EKG was personally reviewed and demonstrates:  NSR with bafascicular block (RBBB and LAFB). Telemetry:  N/A  Relevant CV Studies: Echo (01/12/18): LVEF 55% with grade 1 diastolic dysfunction and mild MR.  Laboratory Data:  Chemistry Recent Labs  Lab 01/11/18 0126 01/12/18 1019  NA 135 136  K 4.5 4.0  CL 104 110  CO2 24 22  GLUCOSE 263* 185*  BUN 23 23  CREATININE 1.43* 1.30*  CALCIUM 8.9 8.0*  GFRNONAA 44* 49*  GFRAA 51* 57*  ANIONGAP 7 4*  Recent Labs  Lab 01/11/18 0126  PROT 6.4*  ALBUMIN 3.2*  AST 42*  ALT 31  ALKPHOS 52  BILITOT 0.5   Hematology Recent Labs  Lab 01/11/18 0126  01/12/18 1019 01/12/18 1503 01/13/18 0519  WBC 8.4  --   --  10.7* 9.1  RBC 3.69*  --   --  2.69* 2.44*  HGB 10.2*   < > 8.0* 8.1* 7.4*  HCT 32.9*   < > 24.5* 24.5* 22.2*  MCV 89.2  --   --  91.1 91.0  MCH 27.6  --   --  30.1 30.3  MCHC 31.0  --   --  33.1 33.3  RDW 14.9  --   --  14.8 14.6  PLT 225  --   --  132* 127*   < > = values in  this interval not displayed.   Cardiac Enzymes Recent Labs  Lab 01/11/18 0126 01/11/18 0514  TROPONINI <0.03 <0.03   No results for input(s): TROPIPOC in the last 168 hours.  BNPNo results for input(s): BNP, PROBNP in the last 168 hours.  DDimer No results for input(s): DDIMER in the last 168 hours.  Radiology/Studies:  Nm Gi Blood Loss  Result Date: 01/11/2018 CLINICAL DATA:  Several episodes of rectal bleeding/melena. Near syncope. Patient takes Coumadin. No GI Surgeries Colonoscopy 2 weeks ago Given 2 units of Blood: Finished at 11am Most Recent Bleeding: 10am EXAM: NUCLEAR MEDICINE GASTROINTESTINAL BLEEDING SCAN TECHNIQUE: Sequential abdominal images were obtained following intravenous administration of Tc-66m labeled red blood cells. RADIOPHARMACEUTICALS:  22.04 mCi Tc-43m pertechnetate in-vitro labeled red cells. COMPARISON:  CT of the abdomen and pelvis on 06/20/2016 FINDINGS: There is expected blood pool activity. No activity identified within bowel loops to indicate the location of GI bleeding. IMPRESSION: Negative GI bleeding scan. Electronically Signed   By: Nolon Nations M.D.   On: 01/11/2018 15:48   Dg Chest Portable 1 View  Result Date: 01/11/2018 CLINICAL DATA:  Central line placement. EXAM: PORTABLE CHEST 1 VIEW COMPARISON:  08/27/2017 FINDINGS: Right internal jugular central venous line tip projects in the upper superior vena cava. No pneumothorax. Cardiac silhouette is normal in size. No mediastinal or hilar masses or convincing adenopathy. Clear lungs. IMPRESSION: 1. Right internal jugular central venous line tip projects in the upper superior vena cava. No pneumothorax. 2. No acute cardiopulmonary disease. Electronically Signed   By: Lajean Manes M.D.   On: 01/11/2018 11:05   [place] Ekg Site Rite  Result Date: 01/11/2018 If Site Rite image not attached, placement could not be confirmed due to current cardiac rhythm.   Assessment and Plan:   Acute GI bleed Workup  pending per GI, with plan for upper and lower endoscopy.  Hemoglobin continues to trend down despite having received 2 units of PRBC's 2 days ago.  No further hematochezia reported by the patient.  Continue to hold anticoagulation, including ASA and warfarin.  Proceed with endoscopies; patient does not have any unstable cardiac symptoms.  This is a low-risk procedure.  Paroxysmal atrial fibrillation No symptoms; EKG on admission shows sinus rhythm.  However, exam today is notable for an irregular rhythm.  Place patient on telemetry.  Hold warfarin in the setting of GI bleed.  Long-term anticoagulation will need to be readdressed after completion of GI bleed, given CHADSVASC score of at least 6.  Metoprolol currently on hold in the setting of GI bleed.  Recommend restarting at discretion of internal medicine, as blood pressure allows.  Coronary artery  disease No angina.  Patient with history of PCI to RCA last year and moderate LMCA disease.  Hold ASA and warfarin.  Would like to restart ASA 81 mg daily when cleared by GI.  Recommend restarting atorvastatin and metoprolol.  Chronic kidney disease stage III Renal function actually better than baseline.  Continue to monitor; avoid nephrotoxic drugs.     For questions or updates, please contact Laura Please consult [email or web address] for contact info under [facility] Cardiology.  Signed, [name], MD  01/13/2018 8:29 AM

## 2018-01-13 NOTE — Anesthesia Preprocedure Evaluation (Signed)
Anesthesia Evaluation  Patient identified by MRN, date of birth, ID band Patient awake    Reviewed: Allergy & Precautions, H&P , NPO status , Patient's Chart, lab work & pertinent test results  History of Anesthesia Complications Negative for: history of anesthetic complications  Airway Mallampati: III  TM Distance: >3 FB Neck ROM: full    Dental  (+) Chipped, Poor Dentition, Missing   Pulmonary shortness of breath and with exertion, COPD, former smoker,           Cardiovascular hypertension, (-) angina+ CAD, + Cardiac Stents and +CHF  + dysrhythmias      Neuro/Psych  Headaches, negative psych ROS   GI/Hepatic Neg liver ROS, GERD  ,  Endo/Other  diabetes, Type 2Hypothyroidism   Renal/GU Renal disease  negative genitourinary   Musculoskeletal  (+) Arthritis ,   Abdominal   Peds  Hematology negative hematology ROS (+)   Anesthesia Other Findings Past Medical History: No date: Arthritis     Comment:  "probably in his legs" (08/15/2016) 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 No date: CAD (coronary artery disease)     Comment:  a. Cath in the late 90's - reportedly ok;  b. 2014 s/p               stenting x 2 @ [facility]; c 08/19/16 Cath/PCI with DES -> RCA,               plan to treat LM 70% medically. Seen by surgery and felt               to be too high risk for CABG. No date: Chronic combined systolic (congestive) and diastolic  (congestive) heart failure (HCC)     Comment:  a. Previously reduced EF-->50% by echo in 2012;  b.               06/2015 Echo: EF 50-55%  c. 07/2016 Echo: EF 45-50%. No date: Chronic kidney disease (CKD), stage IV (severe) (HCC) No date: CML (chronic myelocytic leukemia) (HCC) No date: GERD (gastroesophageal reflux disease) No date: Gout No date: Hyperlipemia No date:  Hypertension No date: Hypothyroidism No date: Iron deficiency anemia No date: Migraine     Comment:  "in the 82s" (08/15/2016) No date: NICM (nonischemic cardiomyopathy) ([place])     Comment:  a. Previously reduced EF-->50% by echo in 2012;  b.               06/2015 Echo: EF 50-55%, Gr2 DD, mild MR, mildly dil LA,               Ao sclerosis, mild TR;  c. 07/2016 Echo: EF 45-50%, ? inf               HK, mild MR, mildly dil LA. No date: PAF (paroxysmal atrial fibrillation) (HCC)     Comment:  a. ? Dx 2014-->s/p DCCV;  b. CHA2DS2VASc = 6-->chronic               coumadin. No date: Prostate cancer ([place]) No date: RBBB No date: Type II diabetes mellitus ([place])  Past Surgical History: No date: CATARACT EXTRACTION W/ INTRAOCULAR LENS  IMPLANT, BILATERAL;  Bilateral 09/2012: CORONARY ANGIOPLASTY WITH STENT PLACEMENT     Comment:  2 stents 08/19/2016: CORONARY STENT INTERVENTION; N/A     Comment:  Procedure: Coronary Stent  Intervention;  Surgeon: [name], MD;  Location: [facility];  Service:              Cardiovascular;  Laterality: N/A; 12/16/2017: ESOPHAGEAL MANOMETRY; N/A     Comment:  Procedure: ESOPHAGEAL MANOMETRY (EM);  Surgeon: Lin Landsman, MD;  Location: [facility] ENDOSCOPY;  Service:               Gastroenterology;  Laterality: N/A; 12/20/2016: ESOPHAGOGASTRODUODENOSCOPY; N/A     Comment:  Procedure: ESOPHAGOGASTRODUODENOSCOPY (EGD);  Surgeon:               Lin Landsman, MD;  Location: [facility] ENDOSCOPY;                Service: Gastroenterology;  Laterality: N/A; 11/03/2017: ESOPHAGOGASTRODUODENOSCOPY (EGD) WITH PROPOFOL; N/A     Comment:  Procedure: ESOPHAGOGASTRODUODENOSCOPY (EGD) WITH               PROPOFOL;  Surgeon: Lin Landsman, MD;  Location:               [facility] ENDOSCOPY;  Service: Gastroenterology;  Laterality:               N/A; No date: EYE SURGERY No date: HERNIA REPAIR     Comment:  "navel" 08/14/2016: LEFT HEART  CATH AND CORONARY ANGIOGRAPHY; N/A     Comment:  Procedure: Left Heart Cath and Coronary Angiography;                Surgeon: [name], MD;  Location: [place]               CV [facility];  Service: Cardiovascular;  Laterality: N/A; No date: TOE AMPUTATION; Right     Comment:  "big toe"  BMI    Body Mass Index:  33.13 kg/m      Reproductive/Obstetrics negative OB ROS                             Anesthesia Physical  Anesthesia Plan  ASA: III  Anesthesia Plan: General   Post-op Pain Management:    Induction: Intravenous  PONV Risk Score and Plan: Propofol infusion and TIVA  Airway Management Planned: Natural Airway and Nasal Cannula  Additional Equipment:   Intra-op Plan:   Post-operative Plan:   Informed Consent: I have reviewed the patients History and Physical, chart, labs and discussed the procedure including the risks, benefits and alternatives for the proposed anesthesia with the patient or authorized representative who has indicated his/her understanding and acceptance.   Dental Advisory Given  Plan Discussed with: Anesthesiologist, CRNA and Surgeon  Anesthesia Plan Comments: (Patient consented for risks of anesthesia including but not limited to:  - adverse reactions to medications - risk of intubation if required - damage to teeth, lips or other oral mucosa - sore throat or hoarseness - Damage to heart, brain, lungs or loss of life  Patient voiced understanding.)        Anesthesia Quick Evaluation

## 2018-01-13 NOTE — Progress Notes (Signed)
[place] at [place] NAME: Knute Mazzuca    MR#:  [id number]  DATE OF BIRTH:  [birth year]  SUBJECTIVE:  No bloody bowel movements overnight.  Denies abdominal pain, nausea, vomiting.  REVIEW OF SYSTEMS:  Review of Systems  Constitutional: Negative for chills and fever.  HENT: Negative for congestion and sore throat.   Eyes: Negative for blurred vision.  Respiratory: Negative for cough and shortness of breath.   Cardiovascular: Negative for chest pain, palpitations and leg swelling.  Gastrointestinal: Positive for blood in stool. Negative for abdominal pain, nausea and vomiting.  Genitourinary: Negative for dysuria and hematuria.  Musculoskeletal: Positive for back pain. Negative for neck pain.  Neurological: Negative for dizziness and headaches.  Psychiatric/Behavioral: Negative for depression. The patient is not nervous/anxious.     DRUG ALLERGIES:   Allergies  Allergen Reactions  . Clopidogrel Anaphylaxis    Other reaction(s): ANAPHYLAXIS;altered mental status;  (electrolytes "out of wack" and confusion per family; THEY DO NOT REMEMBER ANY OTHER REACTION)- MSB 10/10/15  . Ciprofloxacin Itching  . Mirtazapine Diarrhea    Bad dreams  . Spironolactone     Other reaction(s): Other (See Comments) gynecomastia   VITALS:  Blood pressure 136/72, pulse 84, temperature 98.3 F (36.8 C), temperature source Oral, resp. rate 18, height 5\' 6"  (1.676 m), weight 94.8 kg, SpO2 100 %. PHYSICAL EXAMINATION:  Physical Exam  General: Sitting up in bed, in NAD, pleasant HEENT: Normocephalic, atraumatic, no scleral icterus, moist mucous membranes Neck: Supple, full range of motion Respiratory: Clear to auscultation bilaterally, no wheezing, crackles, or rhonchi.  Normal work of breathing. Cardiac: Regular rate and rhythm, no murmurs, rubs, or gallops. GI: Soft, positive bowel sounds, nontender, nondistended, no rebound or  guarding. Musculoskeletal: No edema or cyanosis Neurological: Awake, alert, moving all extremities, no focal deficits Psych: Normal affect, normal thought content LABORATORY PANEL:  Male CBC Recent Labs  Lab 01/13/18 0519  WBC 9.1  HGB 7.4*  HCT 22.2*  PLT 127*   ------------------------------------------------------------------------------------------------------------------ Chemistries  Recent Labs  Lab 01/11/18 0126 01/11/18 0514 01/12/18 1019  NA 135  --  136  K 4.5  --  4.0  CL 104  --  110  CO2 24  --  22  GLUCOSE 263*  --  185*  BUN 23  --  23  CREATININE 1.43*  --  1.30*  CALCIUM 8.9  --  8.0*  MG  --  1.7  --   AST 42*  --   --   ALT 31  --   --   ALKPHOS 52  --   --   BILITOT 0.5  --   --    RADIOLOGY:  No results found. ASSESSMENT AND PLAN:   GI bleed- Hemoglobin trended down, no additional bloody bowel movements.  Received K Centra and vitamin K. -GI following-plan for colonoscopy and endoscopy today -Continue PPI -Continue holding Coumadin and aspirin -Cleared for scope by cardiology  Acute blood loss anemia-hemoglobin 7.4 this morning.  Has received 2 units PRBCs so far this admission. -Will give 1 additional unit pRBCs -Recheck hemoglobin in the morning  Paroxysmal atrial fibrillation-has been in NSR -Holding Coumadin due to GI bleed -Cardiology consulted-patient has a CHADSVASC score of 6, so need for long-term anticoagulation will need to be readdressed in the future. -Restart metoprolol today  CAD status post stent-stable, no active chest pain this admission. -Will likely restart aspirin when cleared by GI -Restart metoprolol today  CKD III-creatinine at baseline -Monitor -Avoid nephrotoxic agents  Hypothyroidism-stable -Continue Synthroid  Type 2 diabetes-sugars moderately well controlled -Continue sliding-scale insulin  All the records are reviewed and case discussed with Care Management/Social Worker. Management plans  discussed with the patient, family and they are in agreement.  CODE STATUS: Full Code  TOTAL TIME TAKING CARE OF THIS PATIENT: 35 minutes.   More than 50% of the time was spent in counseling/coordination of care: YES  POSSIBLE D/C IN 1-2 DAYS, DEPENDING ON CLINICAL CONDITION.   Berna Spare Bowyn Mercier M.D on 01/13/2018 at 1:06 PM  Between 7am to 6pm - Pager - [phone]  After 6pm go to [email or web address] - Proofreader  [facility]  Office  [phone]  CC: Primary care physician; [name], DO  Note: This dictation was prepared with Dragon dictation along with smaller phrase technology. Any transcriptional errors that result from this process are unintentional.

## 2018-01-13 NOTE — Transfer of Care (Signed)
Immediate Anesthesia Transfer of Care Note  Patient: JERIC SLAGEL  Procedure(s) Performed: ESOPHAGOGASTRODUODENOSCOPY (EGD) (N/A ) COLONOSCOPY (N/A )  Patient Location: Endoscopy Unit  Anesthesia Type:General  Level of Consciousness: sedated  Airway & Oxygen Therapy: Patient connected to nasal cannula oxygen  Post-op Assessment: Post -op Vital signs reviewed and stable  Post vital signs: stable  Last Vitals:  Vitals Value Taken Time  BP 117/65 01/13/2018  4:05 PM  Temp 36.1 C 01/13/2018  4:05 PM  Pulse 75 01/13/2018  4:07 PM  Resp 25 01/13/2018  4:07 PM  SpO2 100 % 01/13/2018  4:07 PM  Vitals shown include unvalidated device data.  Last Pain:  Vitals:   01/13/18 1605  TempSrc: Tympanic  PainSc: Asleep         Complications: No apparent anesthesia complications

## 2018-01-13 NOTE — Progress Notes (Signed)
[place] monitoring consult  84 yo male on Warfarin PTA with GIB and KCentra administered. INR prior to Kcentra= 2.35. Hgb 8.1 Plt 225. IV vitK 10mg  also administered, Hgb has rebounded slightly to 8.5 this morning (GI aware and following). He has received 3 units PRBCs so far this admission (1 yesterday evening).  10/27 1015: INR ~1 hr post infusion= 1.37 10/27 1610  INR= 1.36 10/27 2125 INR 1.33 10/28 0419 INR 1.20 10/28 1019 INR 1.28 10/29 0519 INR 1.28 10/30 0528 INR 1.20   Vallery Sa, PharmD Clinical Pharmacist 01/13/2018

## 2018-01-14 ENCOUNTER — Encounter: Payer: Self-pay | Admitting: Gastroenterology

## 2018-01-14 ENCOUNTER — Telehealth: Payer: Self-pay

## 2018-01-14 LAB — CBC
HCT: 25.6 % — ABNORMAL LOW (ref 39.0–52.0)
Hemoglobin: 8.5 g/dL — ABNORMAL LOW (ref 13.0–17.0)
MCH: 29.3 pg (ref 26.0–34.0)
MCHC: 33.2 g/dL (ref 30.0–36.0)
MCV: 88.3 fL (ref 80.0–100.0)
PLATELETS: 152 10*3/uL (ref 150–400)
RBC: 2.9 MIL/uL — AB (ref 4.22–5.81)
RDW: 15.9 % — AB (ref 11.5–15.5)
WBC: 8.3 10*3/uL (ref 4.0–10.5)
nRBC: 0.7 % — ABNORMAL HIGH (ref 0.0–0.2)

## 2018-01-14 LAB — GLUCOSE, CAPILLARY
GLUCOSE-CAPILLARY: 168 mg/dL — AB (ref 70–99)
Glucose-Capillary: 138 mg/dL — ABNORMAL HIGH (ref 70–99)
Glucose-Capillary: 222 mg/dL — ABNORMAL HIGH (ref 70–99)

## 2018-01-14 LAB — BASIC METABOLIC PANEL
Anion gap: 10 (ref 5–15)
BUN: 8 mg/dL (ref 8–23)
CO2: 21 mmol/L — AB (ref 22–32)
Calcium: 8.3 mg/dL — ABNORMAL LOW (ref 8.9–10.3)
Chloride: 107 mmol/L (ref 98–111)
Creatinine, Ser: 1.01 mg/dL (ref 0.61–1.24)
GFR calc Af Amer: >60 mL/min (ref >60)
GFR calc non Af Amer: >60 mL/min (ref >60)
GLUCOSE: 152 mg/dL — AB (ref 70–99)
POTASSIUM: 3.8 mmol/L (ref 3.5–5.1)
Sodium: 138 mmol/L (ref 135–145)

## 2018-01-14 LAB — PROTIME-INR
INR: 1.2
PROTHROMBIN TIME: 15.1 s (ref 11.4–15.2)

## 2018-01-14 MED ORDER — OMEPRAZOLE 40 MG PO CPDR: 40.0000 mg | DELAYED_RELEASE_CAPSULE | Freq: Two times a day (BID) | ORAL | 0 refills | Status: DC

## 2018-01-14 MED ORDER — PANTOPRAZOLE SODIUM 40 MG PO TBEC
40.0000 mg | DELAYED_RELEASE_TABLET | Freq: Two times a day (BID) | ORAL | Status: DC
  Administered 2018-01-14: 40 mg via ORAL
  Filled 2018-01-14: qty 1

## 2018-01-14 NOTE — Discharge Summary (Addendum)
[facility] at [place] NAME: Lucas Caldwell    MR#:  [id number]  DATE OF BIRTH:  Jul 31, 1933  DATE OF ADMISSION:  01/11/2018   ADMITTING PHYSICIAN: Arta Silence, MD  DATE OF DISCHARGE: 01/14/2018 12:38 PM  PRIMARY CARE PHYSICIAN: [name], DO   ADMISSION DIAGNOSIS:  Rectal bleeding [K62.5] Syncope, unspecified syncope type [R55] DISCHARGE DIAGNOSIS:  Active Problems:   Rectal bleeding   Hematochezia   Melena  SECONDARY DIAGNOSIS:   Past Medical History:  Diagnosis Date  . Arthritis    "probably in his legs" (08/15/2016)  . Blind right eye   . BPH (benign prostatic hyperplasia)   . Breast asymmetry    Left breast is larger, present for several years.  [name] CAD (coronary artery disease)    a. Cath in the late 90's - reportedly ok;  b. 2014 s/p stenting x 2 @ [facility]; c 08/19/16 Cath/PCI with DES -> RCA, plan to treat LM 70% medically. Seen by surgery and felt to be too high risk for CABG.  . Chronic combined systolic (congestive) and diastolic (congestive) heart failure ([place])    a. Previously reduced EF-->50% by echo in 2012;  b. 06/2015 Echo: EF 50-55%  c. 07/2016 Echo: EF 45-50%.  . Chronic kidney disease (CKD), stage IV (severe) ([place])   . CML (chronic myelocytic leukemia) ([place])   . GERD (gastroesophageal reflux disease)   . Gout   . Hyperlipemia   . Hypertension   . Hypothyroidism   . Iron deficiency anemia   . Migraine    "in the 1960s" (08/15/2016)  . NICM (nonischemic cardiomyopathy) ([place])    a. Previously reduced EF-->50% by echo in 2012;  b. 06/2015 Echo: EF 50-55%, Gr2 DD, mild MR, mildly dil LA, Ao sclerosis, mild TR;  c. 07/2016 Echo: EF 45-50%, ? inf HK, mild MR, mildly dil LA.  [name] PAF (paroxysmal atrial fibrillation) (HCC)    a. ? Dx 2014-->s/p DCCV;  b. CHA2DS2VASc = 6-->chronic coumadin.  . Prostate cancer ([place])   . RBBB   . Type II diabetes mellitus [facility])    HOSPITAL COURSE:   Ranard is an  84 year old male on Coumadin for paroxysmal A. fib who presented to the ED with bright red blood per rectum.  In the ED, he was noted to be in hypovolemic shock.  He was admitted to stepdown for further management.  Hematochezia- secondary to multiple duodenal ulcers in the setting of Coumadin use. -Initially treated with FFP, vitamin K, Kcentra, TXE, IV PPI -Received 3 units pRBCs during this admission -Tagged RBC scan negative -Underwent endoscopy and colonoscopy on 10/29, which showed multiple ulcers in the duodenum and a single bleeding ulcer in the proximal ascending colon with 2 hemostatic clips successfully placed. -Hemoglobin remained stable after his scopes -Discharged home on Prilosec 40 mg twice daily.  Should stay on this long-term per GI recs. -Seen by cardiology, who felt that long-term anticoagulation will need to be addressed as an outpatient due to patient's CHADSVASC score of 6. -Continued on home aspirin 81mg .  Coumadin stopped for at least 1 to 2 weeks per GI recs. -Cardiology and GI follow-up appointments were scheduled  Paroxysmal atrial fibrillation-in NSR this admission -Continue aspirin and beta-blocker -Holding Coumadin until this can be readdressed as an outpatient  DISCHARGE CONDITIONS:  Duodenal ulcers Acute blood loss anemia Paroxysmal atrial fibrillation CAD status post stent CKD 3 Hypothyroidism Type 2 diabetes CONSULTS OBTAINED:  Treatment Team:  Sesser,  Aliene Altes, MD Lin Landsman, MD Wellington Hampshire, MD DRUG ALLERGIES:   Allergies  Allergen Reactions  . Clopidogrel Anaphylaxis    Other reaction(s): ANAPHYLAXIS;altered mental status;  (electrolytes "out of wack" and confusion per family; THEY DO NOT REMEMBER ANY OTHER REACTION)- MSB 10/10/15  . Ciprofloxacin Itching  . Mirtazapine Diarrhea    Bad dreams  . Spironolactone     Other reaction(s): Other (See Comments) gynecomastia   DISCHARGE MEDICATIONS:   Allergies as of  01/14/2018      Reactions   Clopidogrel Anaphylaxis   Other reaction(s): ANAPHYLAXIS;altered mental status;  (electrolytes "out of wack" and confusion per family; THEY DO NOT REMEMBER ANY OTHER REACTION)- MSB 10/10/15   Ciprofloxacin Itching   Mirtazapine Diarrhea   Bad dreams   Spironolactone    Other reaction(s): Other (See Comments) gynecomastia      Medication List    STOP taking these medications   fenofibrate 160 MG tablet   isosorbide mononitrate 60 MG 24 hr tablet Commonly known as:  IMDUR   warfarin 3 MG tablet Commonly known as:  COUMADIN     TAKE these medications   acetaminophen 500 MG tablet Commonly known as:  TYLENOL Take 500 mg by mouth every 6 (six) hours as needed.   albuterol 108 (90 Base) MCG/ACT inhaler Commonly known as:  PROVENTIL HFA;VENTOLIN HFA Inhale 2 puffs into the lungs every 6 (six) hours as needed for wheezing or shortness of breath.   Alcohol Swabs 70 % Pads Use when checking blood sugar up to 3x daily   allopurinol 100 MG tablet Commonly known as:  ZYLOPRIM Take 100 mg by mouth daily.   aspirin EC 81 MG tablet Take 1 tablet (81 mg total) by mouth daily.   atorvastatin 80 MG tablet Commonly known as:  LIPITOR Take 1 tablet (80 mg total) by mouth daily.   bosutinib 100 MG tablet Commonly known as:  BOSULIF Take 200 mg by mouth at bedtime. Take with food.   Dulaglutide 0.75 MG/0.5ML Sopn Inject 0.75 mg into the skin once a week.   enalapril 5 MG tablet Commonly known as:  VASOTEC Take 1 tablet (5 mg total) by mouth daily.   fluticasone furoate-vilanterol 100-25 MCG/INH Aepb Commonly known as:  BREO ELLIPTA Inhale 1 puff into the lungs daily.   gabapentin 300 MG capsule Commonly known as:  NEURONTIN Take 2 capsules in morning, 1 capsule in afternoon, 2 capsules at bedtime. In future increase up to max of 3 capsules 3 times daily   Insulin Pen Needle 31G X 5 MM Misc 31 g by Does not apply route as directed.     levothyroxine 50 MCG tablet Commonly known as:  SYNTHROID, LEVOTHROID Take 50 mcg by mouth daily before breakfast.   loperamide 2 MG tablet Commonly known as:  IMODIUM A-D Take 2 mg by mouth 3 (three) times daily as needed for diarrhea or loose stools.   metoprolol succinate 50 MG 24 hr tablet Commonly known as:  TOPROL-XL Take 1 tablet (50 mg total) by mouth every morning.   mirtazapine 15 MG tablet Commonly known as:  REMERON Take 15 mg by mouth at bedtime.   MULTIVITAMIN ADULTS 50+ PO Take 1 tablet by mouth every morning.   nepafenac 0.1 % ophthalmic suspension Commonly known as:  [place] 1 drop into both eyes 3 (three) times daily as needed.   nitroGLYCERIN 0.4 MG SL tablet Commonly known as:  NITROSTAT Place 1 tablet (0.4 mg total) under the  tongue every 5 (five) minutes x 3 doses as needed for chest pain.   omeprazole 40 MG capsule Commonly known as:  PRILOSEC Take 1 capsule (40 mg total) by mouth 2 (two) times daily. What changed:  when to take this   [place] 1 each by Other route once.   ranitidine 300 MG tablet Commonly known as:  ZANTAC Take 1 tablet (300 mg total) by mouth daily.   tamsulosin 0.4 MG Caps capsule Commonly known as:  FLOMAX Take 0.4 mg by mouth.   torsemide 20 MG tablet Commonly known as:  DEMADEX Take 40 mg by mouth daily.        DISCHARGE INSTRUCTIONS:  1.  Follow-up with PCP in 1 to 2 weeks 2.  Follow-up with cardiology in 1 to 2 weeks 3.  Follow-up with gastroenterology in 1 to 2 weeks 4.  Recheck CBC as an outpatient 5.  Readdress need for long-term anticoagulation as an outpatient 6.  Ensure patient is taking Prilosec 40 mg twice daily.  Needs to take this long-term. DIET:  Cardiac diet DISCHARGE CONDITION:  Stable ACTIVITY:  Activity as tolerated OXYGEN:  Home Oxygen: No.  Oxygen Delivery: room air DISCHARGE LOCATION:  home   If you experience worsening of your admission symptoms, develop  shortness of breath, life threatening emergency, suicidal or homicidal thoughts you must seek medical attention immediately by calling 911 or calling your MD immediately  if symptoms less severe.  You Must read complete instructions/literature along with all the possible adverse reactions/side effects for all the Medicines you take and that have been prescribed to you. Take any new Medicines after you have completely understood and accpet all the possible adverse reactions/side effects.   Please note  You were cared for by a hospitalist during your hospital stay. If you have any questions about your discharge medications or the care you received while you were in the hospital after you are discharged, you can call the unit and asked to speak with the hospitalist on call if the hospitalist that took care of you is not available. Once you are discharged, your primary care physician will handle any further medical issues. Please note that NO REFILLS for any discharge medications will be authorized once you are discharged, as it is imperative that you return to your primary care physician (or establish a relationship with a primary care physician if you do not have one) for your aftercare needs so that they can reassess your need for medications and monitor your lab values.    On the day of Discharge:  Doing well, no further bleeding. VITAL SIGNS:  Blood pressure 131/76, pulse 79, temperature 98.9 F (37.2 C), temperature source Oral, resp. rate 17, height 5\' 6"  (1.676 m), weight 87.1 kg, SpO2 100 %. PHYSICAL EXAMINATION:  GENERAL:  84 y.o.-year-old patient lying in the bed with no acute distress.  EYES: Pupils equal, round, reactive to light and accommodation. No scleral icterus. Extraocular muscles intact.  HEENT: Head atraumatic, normocephalic. Oropharynx and nasopharynx clear.  NECK:  Supple, no jugular venous distention. No thyroid enlargement, no tenderness.  LUNGS: Normal breath sounds  bilaterally, no wheezing, rales,rhonchi or crepitation. No use of accessory muscles of respiration.  CARDIOVASCULAR: S1, S2 normal. No murmurs, rubs, or gallops.  ABDOMEN: Soft, non-tender, non-distended. Bowel sounds present. No organomegaly or mass.  EXTREMITIES: No pedal edema, cyanosis, or clubbing.  NEUROLOGIC: Cranial nerves II through XII are intact. Muscle strength 5/5 in all extremities. Sensation intact. Gait not  checked.  PSYCHIATRIC: The patient is alert and oriented x 3.  SKIN: No obvious rash, lesion, or ulcer.  DATA REVIEW:   CBC Recent Labs  Lab 01/14/18 0528  WBC 8.3  HGB 8.5*  HCT 25.6*  PLT 152    Chemistries  Recent Labs  Lab 01/11/18 0126 01/11/18 0514  01/14/18 0528  NA 135  --    < > 138  K 4.5  --    < > 3.8  CL 104  --    < > 107  CO2 24  --    < > 21*  GLUCOSE 263*  --    < > 152*  BUN 23  --    < > 8  CREATININE 1.43*  --    < > 1.01  CALCIUM 8.9  --    < > 8.3*  MG  --  1.7  --   --   AST 42*  --   --   --   ALT 31  --   --   --   ALKPHOS 52  --   --   --   BILITOT 0.5  --   --   --    < > = values in this interval not displayed.     Microbiology Results  Results for orders placed or performed during the hospital encounter of 01/11/18  MRSA PCR Screening     Status: None   Collection Time: 01/11/18  4:41 PM  Result Value Ref Range Status   MRSA by PCR NEGATIVE NEGATIVE Final    Comment:        The GeneXpert MRSA Assay (FDA approved for NASAL specimens only), is one component of a comprehensive MRSA colonization surveillance program. It is not intended to diagnose MRSA infection nor to guide or monitor treatment for MRSA infections. Performed at [facility], [address] [place], [place] [address]     RADIOLOGY:  No results found.   Management plans discussed with the patient, family and they are in agreement.  CODE STATUS: Full Code   TOTAL TIME TAKING CARE OF THIS PATIENT: 45 minutes.    Berna Spare Mayo  M.D on 01/14/2018 at 12:55 PM  Between 7am to 6pm - Pager - [phone]  After 6pm go to [email or web address] - Proofreader  [facility]  Office  [phone]  CC: Primary care physician; [name], DO   Note: This dictation was prepared with Dragon dictation along with smaller phrase technology. Any transcriptional errors that result from this process are unintentional.

## 2018-01-14 NOTE — Anesthesia Postprocedure Evaluation (Signed)
Anesthesia Post Note  Patient: Lucas Caldwell  Procedure(s) Performed: ESOPHAGOGASTRODUODENOSCOPY (EGD) (N/A ) COLONOSCOPY (N/A )  Patient location during evaluation: PACU Anesthesia Type: General Level of consciousness: awake and alert and oriented Pain management: pain level controlled Vital Signs Assessment: post-procedure vital signs reviewed and stable Respiratory status: spontaneous breathing Cardiovascular status: blood pressure returned to baseline Anesthetic complications: no     Last Vitals:  Vitals:   01/13/18 1940 01/13/18 2325  BP: (!) 143/62 (!) 144/67  Pulse: 73 93  Resp: 17 17  Temp: 36.9 C 37.2 C  SpO2: 99% 97%    Last Pain:  Vitals:   01/13/18 2325  TempSrc: Oral  PainSc:                  Jamari Diana

## 2018-01-14 NOTE — Telephone Encounter (Signed)
Error

## 2018-01-14 NOTE — Discharge Instructions (Signed)
It was so nice to meet you during this hospitalization!  You came into the hospital because you were having bleeding. We did a colonoscopy and endoscopy, which showed ulcers that were bleeding. You should continue taking the aspirin at home but please STOP the coumadin for now. You should follow-up Dr. Saunders Revel (heart doctor) and Dr. Marius Ditch (stomach and intestine doctor) in the next 1-2 weeks.  Please make sure you taking Prilosec 40mg  twice a day. I have sent in a new prescription into your pharmacy.  -Dr. Brett Albino

## 2018-01-14 NOTE — Progress Notes (Signed)
Pt. Discharged to home via family vehicle. Discharge instructions and medication regimen reviewed at bedside with patient. Pt. verbalizes understanding of instructions and medication regimen. Prescriptions sent to pharmacy. Patient assessment unchanged from this morning. TELE and IV discontinued per policy.

## 2018-01-14 NOTE — Progress Notes (Signed)
    Lucas Darby, MD [address]  [place]  [place], [place] [address]  Main: [phone]  Fax: [phone] Pager: [phone]   Subjective: He denies any complaints.  No further episodes of hematochezia.  Underwent EGD and colonoscopy yesterday.  Hemoglobin is stable.   Objective: Vital signs in last 24 hours: Vitals:   01/13/18 1940 01/13/18 2325 01/14/18 0500 01/14/18 0805  BP: (!) 143/62 (!) 144/67 (!) 147/60 131/76  Pulse: 73 93 84 79  Resp: 17 17 17    Temp: 98.4 F (36.9 C) 99 F (37.2 C) 99.4 F (37.4 C) 98.9 F (37.2 C)  TempSrc: Oral Oral Oral Oral  SpO2: 99% 97% 100% 100%  Weight:   192 lb 0.3 oz (87.1 kg)   Height:       Weight change:   Intake/Output Summary (Last 24 hours) at 01/14/2018 1554 Last data filed at 01/14/2018 1025 Gross per 24 hour  Intake 240 ml  Output 1120 ml  Net -880 ml     Exam: Heart:: Regular rate and rhythm or S1S2 present Lungs: normal and clear to auscultation Abdomen: soft, nontender, normal bowel sounds   Lab Results: CBC Latest Ref Rng & Units 01/14/2018 01/13/2018 01/13/2018  WBC 4.0 - 10.5 K/uL 8.3 - 9.1  Hemoglobin 13.0 - 17.0 g/dL 8.5(L) 8.4(L) 7.4(L)  Hematocrit 39.0 - 52.0 % 25.6(L) 24.9(L) 22.2(L)  Platelets 150 - 400 K/uL 152 - 127(L)   Micro Results: Recent Results (from the past 240 hour(s))  MRSA PCR Screening     Status: None   Collection Time: 01/11/18  4:41 PM  Result Value Ref Range Status   MRSA by PCR NEGATIVE NEGATIVE Final    Comment:        The GeneXpert MRSA Assay (FDA approved for NASAL specimens only), is one component of a comprehensive MRSA colonization surveillance program. It is not intended to diagnose MRSA infection nor to guide or monitor treatment for MRSA infections. Performed at [facility], [address], [place], [place] [address]    Studies/Results: No results found. Medications: I have reviewed the patient's current medications. Scheduled  Meds:  Continuous Infusions: PRN Meds:.   Assessment: Active Problems:   Rectal bleeding   Hematochezia   Melena  Status post EGD and colonoscopy EGD revealed multiple duodenal ulcers, clean-based.  Colonoscopy revealed ulcer at the site of AVM that was treated with APC Hematochezia resolved  Plan: Hemoglobin stable, okay to discharge home today Restart aspirin 81 mg Follow-up with cardiology about resumption of anticoagulation as outpatient Recheck CBC in 1 week Follow-up with me in 2 to 3 weeks after discharge   LOS: 3 days   Lucas Caldwell 01/14/2018, 3:54 PM

## 2018-01-14 NOTE — Telephone Encounter (Signed)
UDPATE patient was discharged from hospital today 10/30.  Could you follow-up with them this week to notify that we did receive the Trulicity and confirm how to get it to the patient, when to pick up etc?  Thanks  [name], [facility] 01/14/2018, 1:17 PM

## 2018-01-14 NOTE — Progress Notes (Signed)
Progress Note  Patient Name: Lucas Caldwell Date of Encounter: 01/14/2018  Primary Cardiologist: [name], MD   Subjective   Feels well.  No chest pain or shortness of breath.  No further rectal bleeding.  Tolerated upper and lower endoscopies well.  Inpatient Medications    Scheduled Meds: . aspirin EC  81 mg Oral Daily  . fluticasone furoate-vilanterol  1 puff Inhalation Daily  . insulin aspart  0-15 Units Subcutaneous TID WC  . insulin aspart  0-5 Units Subcutaneous QHS  . metoprolol succinate  50 mg Oral Daily  . pantoprazole  40 mg Oral BID  . sodium chloride flush  3 mL Intravenous Q12H   Continuous Infusions:  PRN Meds: albuterol, fentaNYL (SUBLIMAZE) injection, nitroGLYCERIN, [DISCONTINUED] ondansetron **OR** ondansetron (ZOFRAN) IV   Vital Signs    Vitals:   01/13/18 1940 01/13/18 2325 01/14/18 0500 01/14/18 0805  BP: (!) 143/62 (!) 144/67 (!) 147/60 131/76  Pulse: 73 93 84 79  Resp: 17 17 17    Temp: 98.4 F (36.9 C) 99 F (37.2 C) 99.4 F (37.4 C) 98.9 F (37.2 C)  TempSrc: Oral Oral Oral Oral  SpO2: 99% 97% 100% 100%  Weight:   87.1 kg   Height:        Intake/Output Summary (Last 24 hours) at 01/14/2018 0948 Last data filed at 01/14/2018 0307 Gross per 24 hour  Intake 397.5 ml  Output 1100 ml  Net -702.5 ml   Filed Weights   01/12/18 0141 01/13/18 1424 01/14/18 0500  Weight: 94.8 kg 87.5 kg 87.1 kg    Telemetry    NSR and sinus tachycardia.  Likely rate-dependent BBB and not VT. - Personally Reviewed  ECG    No new tracing.  Physical Exam   GEN: No acute distress.   Neck: No JVD Cardiac: Tachycardic but regular; no murmurs, rubs, or gallops.  Respiratory: Clear to auscultation bilaterally. GI: Soft, nontender, non-distended  MS: No edema; No deformity. Neuro:  Nonfocal  Psych: Normal affect   Labs    Chemistry Recent Labs  Lab 01/11/18 0126 01/12/18 1019 01/14/18 0528  NA 135 136 138  K 4.5 4.0 3.8  CL 104  110 107  CO2 24 22 21*  GLUCOSE 263* 185* 152*  BUN 23 23 8   CREATININE 1.43* 1.30* 1.01  CALCIUM 8.9 8.0* 8.3*  PROT 6.4*  --   --   ALBUMIN 3.2*  --   --   AST 42*  --   --   ALT 31  --   --   ALKPHOS 52  --   --   BILITOT 0.5  --   --   GFRNONAA 44* 49* >60  GFRAA 51* 57* >60  ANIONGAP 7 4* 10     Hematology Recent Labs  Lab 01/12/18 1503 01/13/18 0519 01/13/18 2212 01/14/18 0528  WBC 10.7* 9.1  --  8.3  RBC 2.69* 2.44*  --  2.90*  HGB 8.1* 7.4* 8.4* 8.5*  HCT 24.5* 22.2* 24.9* 25.6*  MCV 91.1 91.0  --  88.3  MCH 30.1 30.3  --  29.3  MCHC 33.1 33.3  --  33.2  RDW 14.8 14.6  --  15.9*  PLT 132* 127*  --  152    Cardiac Enzymes Recent Labs  Lab 01/11/18 0126 01/11/18 0514  TROPONINI <0.03 <0.03   No results for input(s): TROPIPOC in the last 168 hours.   BNPNo results for input(s): BNP, PROBNP in the last 168 hours.  DDimer No results for input(s): DDIMER in the last 168 hours.   Radiology    No results found.  Cardiac Studies   Echo (01/12/18): LVEF 55% with grade 1 diastolic dysfunction and mild MR.  Patient Profile     84 y.o. male coronary artery disease, mixed ischemic and nonischemic cardiomyopathy, hypertension, hyperlipidemia, type 2 diabetes mellitus, paroxysmal atrial fibrillation, chronic kidney disease stage III, CML, iron deficiency anemia, BPH, and hypothyroidism, admitted with GI bleed.  Assessment & Plan    GI bleed No further rectal bleeding.  Hemoglobin stable.  EGD and colonoscopy showed duodenal ulcers and oozing from recently treated colonic AVM.  Recommendations to hold warfarin for at least 1-2 weeks per GI; ok to restart low-dose aspirin. 1. ASA 81 mg daily to restart today. 2. Defer adding back warfarin or other anticoagulant. 3. PPI per GI. 4. Hemoglobin stable.  Further management per IM and GI.  CAD No symptoms. 1. Restart ASA 81 mg daily, given PCI to RCA last year and moderate LMCA disease. 2. Restart atorvastatin  at discharge. 3. Restart metoprolol, if ok with GI and IM.  PAF Tachycardic but appears to be sinus.  Tachycardia likely exacerbated by acute blood loss anemia. 1. Check EKG this morning to confirm sinus rhythm with underlying BBB. 2. Hold warfarin given acute blood loss anemia. 3. Will need to weigh risks and benefits of restarting anticoagulation in the future, given CHADSVASC score of at least 6. 4. Restart metoprolol when ok per GI/IM.  [facility] will sign off.   Medication Recommendations:  Hold warfarin pending outpatient follow-up. Other recommendations (labs, testing, etc):  EKG today. Follow up as an outpatient:  1-2 weeks.  For questions or updates, please contact [place] Please consult [email or web address] for contact info under [facility].    Signed, [name], MD  01/14/2018, 9:48 AM

## 2018-01-14 NOTE — Telephone Encounter (Signed)
Patient was informed while Lucas Caldwell was still in hospital she advised to pick up med's at her convenience.

## 2018-01-14 NOTE — Care Management Important Message (Signed)
Important Message  Patient Details  Name: MAKHAI FULCO MRN: [id number] Date of Birth: Apr 07, 1933   Medicare Important Message Given:  Yes    Juliann Pulse A Jules Vidovich 01/14/2018, 10:54 AM

## 2018-01-15 LAB — TYPE AND SCREEN
ABO/RH(D): AB NEG
Antibody Screen: NEGATIVE
UNIT DIVISION: 0
UNIT DIVISION: 0
Unit division: 0
Unit division: 0
Unit division: 0
Unit division: 0

## 2018-01-15 LAB — BPAM RBC
BLOOD PRODUCT EXPIRATION DATE: 201910312359
BLOOD PRODUCT EXPIRATION DATE: 201911132359
Blood Product Expiration Date: 201911142359
Blood Product Expiration Date: 201911142359
Blood Product Expiration Date: 201911142359
Blood Product Expiration Date: 201911182359
ISSUE DATE / TIME: 201910270727
ISSUE DATE / TIME: 201910271631
ISSUE DATE / TIME: 201910270855
ISSUE DATE / TIME: 201910270855
ISSUE DATE / TIME: 201910290930
UNIT TYPE AND RH: 600
UNIT TYPE AND RH: 600
UNIT TYPE AND RH: 600
Unit Type and Rh: 600
Unit Type and Rh: 600
Unit Type and Rh: 600

## 2018-01-15 LAB — PREPARE RBC (CROSSMATCH)

## 2018-01-15 LAB — H PYLORI, IGM, IGG, IGA AB
H PYLORI IGG: 0.43 {index_val} (ref 0.00–0.79)
H. Pylogi, Iga Abs: <9 units (ref 0.0–8.9)

## 2018-01-15 LAB — SURGICAL PATHOLOGY

## 2018-01-16 ENCOUNTER — Telehealth: Payer: Self-pay

## 2018-01-16 NOTE — Telephone Encounter (Signed)
Transition Care Management Follow-up Telephone Call  Date of discharge and from where: [facility] on 01/14/18  How have you been since you were released from the hospital? Doing good, declines bleeding, pain, fever or n/v/d.   Any questions or concerns? No   Items Reviewed:  Did the pt receive and understand the discharge instructions provided? Yes   Medications obtained and verified? Will review at [facility].  Any new allergies since your discharge? No   Dietary orders reviewed? Yes  Do you have support at home? Yes   Other (ie: DME, Home Health, etc) N/A  Functional Questionnaire: (I = Independent and D = Dependent)  Bathing/Dressing- I   Meal Prep- I  Eating- I  Maintaining continence- I  Transferring/Ambulation- Uses a cane for walking.  Managing Meds- D, wife manages.   Follow up appointments reviewed:    PCP Hospital f/u appt confirmed? Yes  Scheduled to see Dr Parks Ranger on 01/27/18 @ 2:20 PM.  [facility] f/u appt confirmed? Yes    Are transportation arrangements needed? No   If their condition worsens, is the pt aware to call  their PCP or go to the ED? Yes  Was the patient provided with contact information for the PCP's office or ED? Yes  Was the pt encouraged to call back with questions or concerns? Yes

## 2018-01-19 ENCOUNTER — Other Ambulatory Visit: Payer: Medicare HMO

## 2018-01-19 ENCOUNTER — Encounter: Payer: Self-pay | Admitting: Family Medicine

## 2018-01-19 ENCOUNTER — Telehealth: Payer: Self-pay | Admitting: Family Medicine

## 2018-01-19 NOTE — Telephone Encounter (Signed)
Pt  Wife called wanted to know pt. Case worker name and # . requesting a call back with this information

## 2018-01-19 NOTE — Telephone Encounter (Signed)
Lm to discuss Lucas Caldwell Referral

## 2018-01-19 NOTE — Telephone Encounter (Signed)
LMTCB with Mr. Lingard.

## 2018-01-20 ENCOUNTER — Telehealth: Payer: Self-pay | Admitting: Family Medicine

## 2018-01-20 ENCOUNTER — Encounter: Payer: Self-pay | Admitting: Family Medicine

## 2018-01-20 NOTE — Telephone Encounter (Signed)
Pt's wife returned your call [phone] or cell [phone]

## 2018-01-20 NOTE — Telephone Encounter (Signed)
Patient's spouse advised to pick up Lucas Caldwell's phone and send staff message to [employer].

## 2018-01-20 NOTE — Telephone Encounter (Signed)
Spoke with pts wife updated referral with notes knb

## 2018-01-21 ENCOUNTER — Telehealth: Payer: Self-pay | Admitting: Family Medicine

## 2018-01-21 NOTE — Telephone Encounter (Signed)
Spoke with Ms. Guarisco and made her aware that the application was approved for food assistance. Will reach back out once the gift card is available at [facility] for pick up  knb

## 2018-01-22 ENCOUNTER — Ambulatory Visit
Admit: 2018-01-22 | Discharge: 2018-01-22 | Payer: MEDICARE | Attending: Hematology & Oncology | Primary: Hematology & Oncology

## 2018-01-22 ENCOUNTER — Other Ambulatory Visit: Admit: 2018-01-22 | Discharge: 2018-01-22 | Payer: MEDICARE

## 2018-01-22 DIAGNOSIS — C9210 Chronic myeloid leukemia, BCR/ABL-positive, not having achieved remission: Principal | ICD-10-CM

## 2018-01-22 DIAGNOSIS — E119 Type 2 diabetes mellitus without complications: Secondary | ICD-10-CM | POA: Diagnosis not present

## 2018-01-22 DIAGNOSIS — E7800 Pure hypercholesterolemia, unspecified: Secondary | ICD-10-CM | POA: Diagnosis not present

## 2018-01-22 DIAGNOSIS — I429 Cardiomyopathy, unspecified: Secondary | ICD-10-CM | POA: Diagnosis not present

## 2018-01-22 DIAGNOSIS — I509 Heart failure, unspecified: Secondary | ICD-10-CM | POA: Diagnosis not present

## 2018-01-22 DIAGNOSIS — I480 Paroxysmal atrial fibrillation: Secondary | ICD-10-CM | POA: Diagnosis not present

## 2018-01-22 DIAGNOSIS — Z9221 Personal history of antineoplastic chemotherapy: Secondary | ICD-10-CM | POA: Diagnosis not present

## 2018-01-22 DIAGNOSIS — E039 Hypothyroidism, unspecified: Secondary | ICD-10-CM | POA: Diagnosis not present

## 2018-01-22 DIAGNOSIS — J45909 Unspecified asthma, uncomplicated: Secondary | ICD-10-CM | POA: Diagnosis not present

## 2018-01-23 ENCOUNTER — Other Ambulatory Visit
Admission: RE | Admit: 2018-01-23 | Discharge: 2018-01-23 | Disposition: A | Discharge status: Home / Self Care | Payer: Medicare HMO | Source: Ambulatory Visit | Attending: Gastroenterology | Admitting: Gastroenterology

## 2018-01-23 ENCOUNTER — Ambulatory Visit (INDEPENDENT_AMBULATORY_CARE_PROVIDER_SITE_OTHER): Payer: Medicare HMO | Admitting: Internal Medicine

## 2018-01-23 ENCOUNTER — Encounter: Payer: Self-pay | Admitting: Internal Medicine

## 2018-01-23 ENCOUNTER — Ambulatory Visit (INDEPENDENT_AMBULATORY_CARE_PROVIDER_SITE_OTHER): Payer: Medicare HMO | Admitting: Gastroenterology

## 2018-01-23 ENCOUNTER — Encounter: Payer: Self-pay | Admitting: Gastroenterology

## 2018-01-23 VITALS — BP 134/78 | HR 81 | Ht 64.0 in | Wt 195.5 lb

## 2018-01-23 VITALS — BP 139/78 | HR 71 | Resp 17 | Ht 64.0 in | Wt 195.4 lb

## 2018-01-23 DIAGNOSIS — K2981 Duodenitis with bleeding: Secondary | ICD-10-CM | POA: Insufficient documentation

## 2018-01-23 DIAGNOSIS — I5032 Chronic diastolic (congestive) heart failure: Secondary | ICD-10-CM | POA: Insufficient documentation

## 2018-01-23 DIAGNOSIS — I480 Paroxysmal atrial fibrillation: Secondary | ICD-10-CM | POA: Diagnosis not present

## 2018-01-23 DIAGNOSIS — K922 Gastrointestinal hemorrhage, unspecified: Secondary | ICD-10-CM

## 2018-01-23 DIAGNOSIS — I10 Essential (primary) hypertension: Secondary | ICD-10-CM | POA: Diagnosis not present

## 2018-01-23 DIAGNOSIS — I2510 Atherosclerotic heart disease of native coronary artery without angina pectoris: Secondary | ICD-10-CM

## 2018-01-23 LAB — CBC
HCT: 29.9 % — ABNORMAL LOW (ref 39.0–52.0)
Hemoglobin: 9.4 g/dL — ABNORMAL LOW (ref 13.0–17.0)
MCH: 29.2 pg (ref 26.0–34.0)
MCHC: 31.4 g/dL (ref 30.0–36.0)
MCV: 92.9 fL (ref 80.0–100.0)
PLATELETS: 280 10*3/uL (ref 150–400)
RBC: 3.22 MIL/uL — AB (ref 4.22–5.81)
RDW: 14.9 % (ref 11.5–15.5)
WBC: 7.9 10*3/uL (ref 4.0–10.5)
nRBC: 0 % (ref 0.0–0.2)

## 2018-01-23 NOTE — Progress Notes (Signed)
[name], MD [address]  [place]  [place], [place] [address]  Main: [phone]  Fax: [phone]    Gastroenterology Consultation  Referring Provider:     [name] * Primary Care Physician:  [name], DO Primary Gastroenterologist:  Dr. [name] Reason for Consultation: Hospital follow-up, ischemic duodenitis        HPI:   Lucas Caldwell is a 84 y.o. y/o male referred by Dr. Parks Ranger, [name], DO  for consultation & management of dysphagia for 3 months.  [name] here for hospital follow-up.  He has multiple comorbidities as listed below, significant for coronary artery disease, congestive heart failure, atrial fibrillation on anticoagulation with brilinta and Coumadin, CML, recent hospitalization on 12/19/2016 with AKI and 3 months history of intermittent dysphagia to solid food. He reports that eating chicken or beef or hard foods which are difficult to chew result in food stuck in upper chest. He denies trouble swallowing liquids or any other foods. Denies losing any weight. Denies abdominal pain, nausea, vomiting, heartburn or regurgitation. He denies having an upper endoscopy or a colonoscopy in the past. He had a barium esophagram today which did not reveal any obvious stricture or a space-occupying lesion or extrinsic compression. There was a possibility of esophageal spasm due to evidence of tertiary contractions of the distal third of the esophagus. [name] also evaluated by speech pathology and there was no evidence of oropharyngeal dysphagia. [name] on Pepcid as outpatient. He denies having any food impaction.  GI was consulted at that time, I performed upper endoscopy which revealed partially obstructing moderate size Schatzki's ring, I did not perform dilation as patient was on both Coumadin and Brilinta.  [name] here for hospital follow-up today.  He reports that since discharge he has been taking ranitidine 300 mg in the  morning and Protonix 40 mg before dinner.  He denies any heartburn but continues to have dysphagia to solid foods especially hard foods like chicken, beef, and ham.  He acknowledged that he tries to eat fast without chewing properly. [name] accompanied by his wife today.  He otherwise denies any complaints today and reports doing well.  [name] not smoking  Follow-up visit 12/01/2017 Patient underwent EGD, found to have widely patent, nonobstructing Schatzki's ring, which was dilated to 34mm as patient has symptoms of dysphagia. He reports that he felt better for 2 days after the procedure and symptoms recurred. He reports dysphagia to both solids and liquids, get stuck in his throat and associated with regurgitation.   Follow-up visit 12/18/2017 Patient underwent esophageal manometry this week.  Awaiting results.  He continues to have sensation of food stuck in his throat associated with regurgitation, early satiety, bloating, gas.  He also reports intermittent nonbloody diarrhea and takes Imodium as needed.  He took Imodium yesterday and did not have bowel movement since then.  Patient appears to be frustrated with ongoing upper GI symptoms.  [name] taking omeprazole 40 mg once a day that was started by me.  However, his symptoms are persistent.  [name] also taking ranitidine 300 mg daily.  He reports that this is working better compared to omeprazole.  Despite these symptoms, patient's weight has been stable in the last 1 year.  He said he stopped eating chicken from [name] and hard meats because of these symptoms.  His son accompanied him today in order to help understand his condition.  [name] reports that  he drinks sweetened tea, coffee with artificial sweeteners regularly.  He does drink soda intermittently. 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  Awaiting final interpretation.  He also underwent screening colonoscopy and was found to have nonbleeding AVM in the ascending colon which was treated with APC.  10 days after this procedure, patient was hospitalized at [facility] on 01/11/2018 secondary to acute blood loss anemia from painless hematochezia.  Patient denies postprandial pain. upper endoscopy revealed antral and duodenal ulcers, pathology consistent with acute ischemic duodenitis.  His aspirin 81 mg and Coumadin were held during the hospital stay.  Hematochezia resolved and Aspirin 81 mg was restarted at the time of discharge.  Patient denies any further episodes of hematochezia since discharge.  He reports having good energy levels, denies shortness of breath, chest pain or lightheadedness.  [name] seen by his cardiologist Dr. Saunders Revel today prior to this visit.  Patient has been taking omeprazole 40 mg twice daily.  He denies having diarrhea anymore, reports that his stools are brown  GI Procedures:  Colonoscopy in 2001 at [facility], reportedly normal  Colonoscopy 01/01/2018 - A single non-bleeding colonic angioectasia. Treated with argon plasma coagulation (APC). - Diverticulosis in the sigmoid colon and in the descending colon. - The distal rectum and anal verge are normal on retroflexion view. - The examination was otherwise normal. - No specimens collected.  EGD 01/13/2018 - The examined portion of the jejunum was normal. - Multiple non-bleeding duodenal ulcers with a clean ulcer base (Forrest Class III) Biopsied , likely source of Bleeding. - Non-bleeding gastric ulcers with a clean ulcer base (Forrest Class III). - Non-obstructing Schatzki ring. - Normal gastroesophageal junction and esophagus.  Colonoscopy 01/13/2018 - Stool in the entire examined colon. - A single (solitary) ulcer in the proximal ascending colon at the previous site of APC for AVM. Clips (MR conditional) were placed. - The  examination was otherwise normal. - No specimens collected.  DIAGNOSIS:  A. DUODENUM; COLD BIOPSY:  - ACUTE ENTERITIS WITH ISCHEMIC-TYPE FEATURES, IN 2 FRAGMENTS, SEE  COMMENT.  - GASTRIC HETEROTOPIA IN 1 FRAGMENT.  - NEGATIVE FOR DYSPLASIA AND MALIGNANCY.   EGD 12/20/2016 Findings: The duodenal bulb and second portion of the duodenum were normal. Diffuse mildly erythematous mucosa without bleeding was found in the entire examined stomach. A single localized, diminutive non-bleeding erosion was found at the incisura. There were stigmata of recent bleeding. A low-grade of narrowing and moderate Schatzki ring (acquired) was found in the lower third of the esophagus, this explains patient's dysphagia, dilation was not performed as pt is on both coumadin and brilinta. The upper third of the esophagus, middle third and lower third of the esophagus and gastroesophageal junction were otherwise normal. No evidence of esophageal cancer  EGD 11/03/2017 - Normal duodenal bulb and second portion of the duodenum. - 1 cm hiatal hernia. - Normal stomach. - Esophagogastric landmarks identified. - Non-obstructing Schatzki ring. Dilated to 30mm. - No specimens collected.  Past Medical History:  Diagnosis Date  . Arthritis    "probably in his legs" (08/15/2016)  . Blind right eye   . BPH (benign prostatic hyperplasia)   . Breast asymmetry    Left breast is larger, present for several years.  [name] CAD (coronary artery disease)    a. Cath in the late 90's - reportedly ok;  b. 2014 s/p stenting x 2 @ [facility]; c 08/19/16 Cath/PCI with DES -> RCA, plan to treat LM 70% medically. Seen by surgery and felt to be too high risk for CABG.  . Chronic combined systolic (congestive) and diastolic (congestive) heart failure ([place])    a. Previously reduced EF-->50% by echo in 2012;  b. 06/2015 Echo: EF 50-55%  c. 07/2016 Echo: EF 45-50%.  . Chronic kidney disease (CKD), stage IV (severe) ([place])   . CML (chronic myelocytic  leukemia) ([place])   . GERD (gastroesophageal reflux disease)   . Gout   . Hyperlipemia   . Hypertension   . Hypothyroidism   . Iron deficiency anemia   . Migraine    "in the 1960s" (08/15/2016)  . NICM (nonischemic cardiomyopathy) ([place])    a. Previously reduced EF-->50% by echo in 2012;  b. 06/2015 Echo: EF 50-55%, Gr2 DD, mild MR, mildly dil LA, Ao sclerosis, mild TR;  c. 07/2016 Echo: EF 45-50%, ? inf HK, mild MR, mildly dil LA.  [name] PAF (paroxysmal atrial fibrillation) ([facility])    a. ? Dx 2014-->s/p DCCV;  b. CHA2DS2VASc = 6-->chronic coumadin.  . Prostate cancer ([place])   . RBBB   . Type II diabetes mellitus ([place])     Past Surgical History:  Procedure Laterality Date  . CATARACT EXTRACTION W/ INTRAOCULAR LENS  IMPLANT, BILATERAL Bilateral   . COLONOSCOPY N/A 01/13/2018   Procedure: COLONOSCOPY;  Surgeon: Lin Landsman, MD;  Location: [facility] ENDOSCOPY;  Service: Gastroenterology;  Laterality: N/A;  . COLONOSCOPY WITH PROPOFOL N/A 01/01/2018   Procedure: COLONOSCOPY WITH PROPOFOL;  Surgeon: Lin Landsman, MD;  Location: [facility] ENDOSCOPY;  Service: Gastroenterology;  Laterality: N/A;  . CORONARY ANGIOPLASTY WITH STENT PLACEMENT  09/2012   2 stents  . CORONARY STENT INTERVENTION N/A 08/19/2016   Procedure: Coronary Stent Intervention;  Surgeon: [name], MD;  Location: [place] CV LAB;  Service: Cardiovascular;  Laterality: N/A;  . ESOPHAGEAL MANOMETRY N/A 12/16/2017   Procedure: ESOPHAGEAL MANOMETRY (EM);  Surgeon: Lin Landsman, MD;  Location: [facility] ENDOSCOPY;  Service: Gastroenterology;  Laterality: N/A;  . ESOPHAGOGASTRODUODENOSCOPY N/A 12/20/2016   Procedure: ESOPHAGOGASTRODUODENOSCOPY (EGD);  Surgeon: Lin Landsman, MD;  Location[facility] ENDOSCOPY;  Service: Gastroenterology;  Laterality: N/A;  . ESOPHAGOGASTRODUODENOSCOPY N/A 01/13/2018   Procedure: ESOPHAGOGASTRODUODENOSCOPY (EGD);  Surgeon: Lin Landsman, MD;  Location: [facility] ENDOSCOPY;  Service:  Gastroenterology;  Laterality: N/A;  . ESOPHAGOGASTRODUODENOSCOPY (EGD) WITH PROPOFOL N/A 11/03/2017   Procedure: ESOPHAGOGASTRODUODENOSCOPY (EGD) WITH PROPOFOL;  Surgeon: Lin Landsman, MD;  Location: [facility] ENDOSCOPY;  Service: Gastroenterology;  Laterality: N/A;  . EYE SURGERY    . HERNIA REPAIR     "[place]"  . LEFT HEART CATH AND CORONARY ANGIOGRAPHY N/A 08/14/2016   Procedure: Left Heart Cath and Coronary Angiography;  Surgeon: [name], MD;  Location: [place] CV LAB;  Service: Cardiovascular;  Laterality: N/A;  . TOE AMPUTATION Right    "big toe"    Current Outpatient Medications:  .  acetaminophen (TYLENOL) 500 MG tablet, Take 500 mg by mouth every 6 (six) hours as needed., Disp: , Rfl:  .  albuterol (PROVENTIL HFA;VENTOLIN HFA) 108 (90 Base) MCG/ACT inhaler, Inhale 2 puffs into the lungs every 6 (six) hours as needed for wheezing or shortness of breath., Disp: 1 Inhaler, Rfl: 2 .  Alcohol Swabs 70 % PADS, Use when checking blood sugar up to 3x daily, Disp: 100 each, Rfl: 11 .  allopurinol (ZYLOPRIM) 100 MG tablet, Take 100 mg  by mouth daily., Disp: , Rfl: 12 .  aspirin EC 81 MG tablet, Take 1 tablet (81 mg total) by mouth daily., Disp: 90 tablet, Rfl: 3 .  atorvastatin (LIPITOR) 80 MG tablet, Take 1 tablet (80 mg total) by mouth daily., Disp: 90 tablet, Rfl: 1 .  bosutinib (BOSULIF) 100 MG tablet, Take 200 mg by mouth at bedtime. Take with food. , Disp: , Rfl:  .  Dulaglutide (TRULICITY) 3.81 OF/7.5ZW SOPN, Inject 0.75 mg into the skin once a week., Disp: 4 pen, Rfl: 0 .  enalapril (VASOTEC) 5 MG tablet, Take 1 tablet (5 mg total) by mouth daily., Disp: 90 tablet, Rfl: 3 .  fluticasone furoate-vilanterol (BREO ELLIPTA) 100-25 MCG/INH AEPB, Inhale 1 puff into the lungs daily., Disp: 28 each, Rfl: 5 .  gabapentin (NEURONTIN) 300 MG capsule, Take 2 capsules in morning, 1 capsule in afternoon, 2 capsules at bedtime. In future increase up to max of 3 capsules 3 times daily,  Disp: 180 capsule, Rfl: 0 .  Insulin Pen Needle 31G X 5 MM MISC, 31 g by Does not apply route as directed., Disp: 100 each, Rfl: 5 .  levothyroxine (SYNTHROID, LEVOTHROID) 50 MCG tablet, Take 25 mcg by mouth daily before breakfast. , Disp: , Rfl:  .  loperamide (IMODIUM A-D) 2 MG tablet, Take 2 mg by mouth 3 (three) times daily as needed for diarrhea or loose stools. , Disp: , Rfl:  .  metoprolol succinate (TOPROL-XL) 50 MG 24 hr tablet, Take 1 tablet (50 mg total) by mouth every morning., Disp: 90 tablet, Rfl: 3 .  Multiple Vitamins-Minerals (MULTIVITAMIN ADULTS 50+ PO), Take 1 tablet by mouth every morning., Disp: , Rfl:  .  nepafenac (NEVANAC) 0.1 % ophthalmic suspension, Place 1 drop into both eyes 3 (three) times daily as needed., Disp: , Rfl:  .  nitroGLYCERIN (NITROSTAT) 0.4 MG SL tablet, Place 1 tablet (0.4 mg total) under the tongue every 5 (five) minutes x 3 doses as needed for chest pain., Disp: 75 tablet, Rfl: 2 .  omeprazole (PRILOSEC) 40 MG capsule, Take 1 capsule (40 mg total) by mouth 2 (two) times daily., Disp: 60 capsule, Rfl: 0 .  Spacer/Aero-Holding Chambers (OPTICHAMBER ADVANTAGE) MISC, 1 each by Other route once., Disp: 1 each, Rfl: 0 .  torsemide (DEMADEX) 20 MG tablet, Take 20 mg by mouth daily. , Disp: , Rfl:  .  ranitidine (ZANTAC) 300 MG tablet, Take 1 tablet (300 mg total) by mouth daily. (Patient not taking: Reported on 01/23/2018), Disp: 90 tablet, Rfl: 3   Family History  Problem Relation Age of Onset  . Brain cancer Father   . Diabetes Sister   . Heart attack Sister      Social History   Tobacco Use  . Smoking status: Former Smoker    Packs/day: 2.00    Years: 15.00    Pack years: 30.00    Types: Cigarettes  . Smokeless tobacco: Former Systems developer  . Tobacco comment: quit 1979  Substance Use Topics  . Alcohol use: No    Comment: previously drank heavily - quit 1979.  . Drug use: No    Allergies as of 01/23/2018 - Review Complete 01/23/2018  Allergen  Reaction Noted  . Clopidogrel Anaphylaxis 07/06/2012  . Ciprofloxacin Itching 10/14/2012  . Mirtazapine Diarrhea 12/31/2017  . Spironolactone  03/29/2014    Review of Systems:    All systems reviewed and negative except where noted in HPI.   Physical Exam:  BP 139/78 (BP Location: Left  Arm, Patient Position: Sitting, Cuff Size: Large)   Pulse 71   Resp 17   Ht 5\' 4"  (1.626 m)   Wt 195 lb 6.4 oz (88.6 kg)   BMI 33.54 kg/m  No LMP for male patient.  General:   Alert,  Well-developed, well-nourished, pleasant and cooperative in NAD Head:  Normocephalic and atraumatic. Eyes:  Sclera clear, no icterus.   Conjunctiva pink. Ears:  Normal auditory acuity. Nose:  No deformity, discharge, or lesions. Mouth:  No deformity or lesions,oropharynx pink & moist. Neck:  Supple; no masses or thyromegaly. Lungs:  Respirations even and unlabored.  Clear throughout to auscultation.   No wheezes, crackles, or rhonchi. No acute distress. Heart:  Regular rate and rhythm; no murmurs, clicks, rubs, or gallops. Abdomen:  Normal bowel sounds. Soft, non-tender and non-distended without masses, hepatosplenomegaly or hernias noted.  No guarding or rebound tenderness.   Rectal: Nor performed Msk:  Symmetrical without gross deformities. Good, equal movement & strength bilaterally. Pulses:  Normal pulses noted. Extremities:  No clubbing or edema.  No cyanosis. Neurologic:  Alert and oriented x3;  grossly normal neurologically. Skin:  Intact without significant lesions or rashes. No jaundice. Psych:  Alert and cooperative. Normal mood and affect.  Imaging Studies: X-ray upper GI 12/19/2016 FINDINGS: There was normal pharyngeal anatomy and motility. Contrast flowed freely through the esophagus without evidence of stricture or mass. There was normal esophageal mucosa without evidence of irregularity or ulceration. Tertiary contractions of the distal third of the esophagus as can be seen with esophageal spasm.  No evidence of reflux. No definite hiatal hernia was demonstrated.  At the end of the examination a 13 mm barium tablet was administered which transited through the esophagus and esophagogastric junction without delay.  IMPRESSION: 1. Tertiary contractions of the distal third of the esophagus as can be seen with esophageal spasm. 2. No esophageal stricture.  Assessment and Plan:   Lucas Caldwell is a 84 y.o. male with coronary artery disease, congestive heart failure, atrial fibrillation on anticoagulation with brilinta and Coumadin, CML, [facility] hospitalization on 12/19/2016 with AKI and 3 months history of  dysphagia to solids and liquids.  EGD showed Nonobstructing Schatzki's ring which was empirically dilated to 59mm with balloon. The patient is able to maintain his weight. He continues to have symptoms of dysphagia.  Acute GI bleed secondary to ischemic duodenitis on 01/11/2018.  He does not have a history of abdominal angina.  Coumadin has been held.  Patient is currently on aspirin 81 mg daily.  Diarrhea resolved  Ischemic duodenitis: Patient is not bleeding at this time Continue aspirin 81 mg daily Continue to hold Coumadin, patient is currently in normal sinus rhythm, however he is chads score is 6. His Cardiologist deferred to me regarding timing of resumption of Coumadin.  Will wait for the patient to be seen by the vascular specialist Check CBC today Continue omeprazole 40 mg twice daily Referral to vascular to evaluate for celiac artery stenosis that may require endovascular intervention   Chronic dysphagia: EGD revealed nonobstructing Schatzki's ring which was empirically dilated to 20 mm Esophageal manometry reveals evolving achalasia based on the pulmonary read Continue omeprazole 40 mg twice daily  Early satiety: Patient has poorly controlled diabetes for a long time He may also have underlying gastroparesis Encouraged him to have small frequent meals, cut back on  high carbohydrate diet Tight control of diabetes   Follow up in 2 months  [name], MD

## 2018-01-23 NOTE — Patient Instructions (Signed)
Medication Instructions:  No changes  If you need a refill on your cardiac medications before your next appointment, please call your pharmacy.   Lab work: None ordered  Testing/Procedures: None ordered  Follow-Up: At [facility], you and your health needs are our priority.  As part of our continuing mission to provide you with exceptional heart care, we have created designated Provider Care Teams.  These Care Teams include your primary Cardiologist (physician) and Advanced Practice Providers (APPs -  Physician Assistants and Nurse Practitioners) who all work together to provide you with the care you need, when you need it. You will need a follow up appointment in 6 weeks.  You may see [name], MD or one of the following Advanced Practice Providers on your designated Care Team:   [name], NP [name], PA-C . [name], PA-C

## 2018-01-23 NOTE — Progress Notes (Signed)
Follow-up Outpatient Visit Date: 01/23/2018  Primary Care Provider: [name], DO [address] [address]  Chief Complaint: Follow-up GI bleed  HPI:  [name] is a 84 y.o. year-old male with history of coronary artery disease, mixed ischemic and nonischemic cardiomyopathy, hypertension, hyperlipidemia, type 2 diabetes mellitus, paroxysmal atrial fibrillation, chronic kidney disease stage III, CML, iron deficiency anemia, GI bleed due to duodenal ulcers, BPH, and hypothyroidism, who presents for follow-up of coronary artery disease, cardiomyopathy, and atrial fibrillation following recent hospitalization for GI bleed.  He was admitted ~2 weeks ago after developing abdominal cramping and having several blood bowel movements.  Of note he had undergone colonoscopy and treatment of angioectasia in the colon just over 1 week earlier (01/01/18).  He had significant acute blood loss anemia requiring PRBC transfusion.  Anticoagulation was also reversed.  From a cardiac standpoint, he did not have any issues and was discharged on ASA 81 mg daily alone after upper and lower endoscopy showed multiple duodenal ulcers as well as oozing from previously treated colonic angioectasia.  Today, Mr. Spychalski reports that he is feeling back to baseline.  He still notices occasional shortness of breath, especially in the mornings when putting on his shoes.  This per resolves promptly with his inhaler.  He also has occasional orthostatic lightheadedness when standing up too quickly.  He has not passed out or fallen.  He denies chest pain, palpitations, orthopnea, and edema.  His weight has been stable.  [name] on aspirin 81 mg daily but is not back on warfarin.  [name] scheduled to follow-up with his gastroenterologist later this morning.  --------------------------------------------------------------------------------------------------  Past Medical History:  Diagnosis Date  . Arthritis    "probably in his legs" (08/15/2016)  . Blind right eye   . BPH (benign prostatic hyperplasia)   . Breast asymmetry    Left breast is larger, present for several years.  [name] CAD (coronary artery disease)    a. Cath in the late 90's - reportedly ok;  b. 2014 s/p stenting x 2 @ [facility]; c 08/19/16 Cath/PCI with DES -> RCA, plan to treat LM 70% medically. Seen by surgery and felt to be too high risk for CABG.  . Chronic combined systolic (congestive) and diastolic (congestive) heart failure ([place])    a. Previously reduced EF-->50% by echo in 2012;  b. 06/2015 Echo: EF 50-55%  c. 07/2016 Echo: EF 45-50%.  . Chronic kidney disease (CKD), stage IV (severe) ([place])   . CML (chronic myelocytic leukemia) ([place])   . GERD (gastroesophageal reflux disease)   . Gout   . Hyperlipemia   . Hypertension   . Hypothyroidism   . Iron deficiency anemia   . Migraine    "in the 1960s" (08/15/2016)  . NICM (nonischemic cardiomyopathy) ([place])    a. Previously reduced EF-->50% by echo in 2012;  b. 06/2015 Echo: EF 50-55%, Gr2 DD, mild MR, mildly dil LA, Ao sclerosis, mild TR;  c. 07/2016 Echo: EF 45-50%, ? inf HK, mild MR, mildly dil LA.  [name] PAF (paroxysmal atrial fibrillation) ([facility])    a. ? Dx 2014-->s/p DCCV;  b. CHA2DS2VASc = 6-->chronic coumadin.  . Prostate cancer ([place])   . RBBB   . Type II diabetes mellitus ([place])    Past Surgical History:  Procedure Laterality Date  . CATARACT EXTRACTION W/ INTRAOCULAR LENS  IMPLANT, BILATERAL Bilateral   . COLONOSCOPY N/A 01/13/2018   Procedure: COLONOSCOPY;  Surgeon: Lin Landsman, MD;  Location: [facility] ENDOSCOPY;  Service: Gastroenterology;  Laterality: N/A;  . COLONOSCOPY WITH PROPOFOL N/A 01/01/2018   Procedure: COLONOSCOPY WITH PROPOFOL;  Surgeon: Lin Landsman, MD;  Location: [facility] ENDOSCOPY;  Service: Gastroenterology;  Laterality: N/A;  . CORONARY ANGIOPLASTY WITH STENT PLACEMENT  09/2012   2 stents  . CORONARY STENT INTERVENTION N/A 08/19/2016   Procedure: Coronary  Stent Intervention;  Surgeon: [name], MD;  Location: [name];  Service: Cardiovascular;  Laterality: N/A;  . ESOPHAGEAL MANOMETRY N/A 12/16/2017   Procedure: ESOPHAGEAL MANOMETRY (EM);  Surgeon: Lin Landsman, MD;  Location: [facility] ENDOSCOPY;  Service: Gastroenterology;  Laterality: N/A;  . ESOPHAGOGASTRODUODENOSCOPY N/A 12/20/2016   Procedure: ESOPHAGOGASTRODUODENOSCOPY (EGD);  Surgeon: Lin Landsman, MD;  Location[facility] ENDOSCOPY;  Service: Gastroenterology;  Laterality: N/A;  . ESOPHAGOGASTRODUODENOSCOPY N/A 01/13/2018   Procedure: ESOPHAGOGASTRODUODENOSCOPY (EGD);  Surgeon: Lin Landsman, MD;  Location: [facility] ENDOSCOPY;  Service: Gastroenterology;  Laterality: N/A;  . ESOPHAGOGASTRODUODENOSCOPY (EGD) WITH PROPOFOL N/A 11/03/2017   Procedure: ESOPHAGOGASTRODUODENOSCOPY (EGD) WITH PROPOFOL;  Surgeon: Lin Landsman, MD;  Location: [facility] ENDOSCOPY;  Service: Gastroenterology;  Laterality: N/A;  . EYE SURGERY    . HERNIA REPAIR     "navel"  . LEFT HEART CATH AND CORONARY ANGIOGRAPHY N/A 08/14/2016   Procedure: Left Heart Cath and Coronary Angiography;  Surgeon: [name], MD;  Location: [place] [facility];  Service: Cardiovascular;  Laterality: N/A;  . TOE AMPUTATION Right    "big toe"    Current Meds  Medication Sig  . acetaminophen (TYLENOL) 500 MG tablet Take 500 mg by mouth every 6 (six) hours as needed.  [name] albuterol (PROVENTIL HFA;VENTOLIN HFA) 108 (90 Base) MCG/ACT inhaler Inhale 2 puffs into the lungs every 6 (six) hours as needed for wheezing or shortness of breath.  . Alcohol Swabs 70 % PADS Use when checking blood sugar up to 3x daily  . allopurinol (ZYLOPRIM) 100 MG tablet Take 100 mg by mouth daily.  [name] aspirin EC 81 MG tablet Take 1 tablet (81 mg total) by mouth daily.  [name] atorvastatin (LIPITOR) 80 MG tablet Take 1 tablet (80 mg total) by mouth daily.  . bosutinib (BOSULIF) 100 MG tablet Take 200 mg by mouth at bedtime. Take with food.    . Dulaglutide (TRULICITY) 2.63 ZC/5.8IF SOPN Inject 0.75 mg into the skin once a week.  . enalapril (VASOTEC) 5 MG tablet Take 1 tablet (5 mg total) by mouth daily.  . fluticasone furoate-vilanterol (BREO ELLIPTA) 100-25 MCG/INH AEPB Inhale 1 puff into the lungs daily.  [name] gabapentin (NEURONTIN) 300 MG capsule Take 2 capsules in morning, 1 capsule in afternoon, 2 capsules at bedtime. In future increase up to max of 3 capsules 3 times daily  . Insulin Pen Needle 31G X 5 MM MISC 31 g by Does not apply route as directed.  [name] levothyroxine (SYNTHROID, LEVOTHROID) 50 MCG tablet Take 25 mcg by mouth daily before breakfast.   . loperamide (IMODIUM A-D) 2 MG tablet Take 2 mg by mouth 3 (three) times daily as needed for diarrhea or loose stools.   . metoprolol succinate (TOPROL-XL) 50 MG 24 hr tablet Take 1 tablet (50 mg total) by mouth every morning.  . Multiple Vitamins-Minerals (MULTIVITAMIN ADULTS 50+ PO) Take 1 tablet by mouth every morning.  . nepafenac (NEVANAC) 0.1 % ophthalmic suspension Place 1 drop into both eyes 3 (three) times daily as needed.  . nitroGLYCERIN (NITROSTAT) 0.4 MG SL tablet Place 1 tablet (0.4 mg total) under  the tongue every 5 (five) minutes x 3 doses as needed for chest pain.  [name] omeprazole (PRILOSEC) 40 MG capsule Take 1 capsule (40 mg total) by mouth 2 (two) times daily.  . ranitidine (ZANTAC) 300 MG tablet Take 1 tablet (300 mg total) by mouth daily.  [name] Spacer/Aero-Holding Chambers (OPTICHAMBER ADVANTAGE) MISC 1 each by Other route once.  . torsemide (DEMADEX) 20 MG tablet Take 20 mg by mouth daily.     Allergies: Clopidogrel; Ciprofloxacin; Mirtazapine; and Spironolactone  Social History   Tobacco Use  . Smoking status: Former Smoker    Packs/day: 2.00    Years: 15.00    Pack years: 30.00    Types: Cigarettes  . Smokeless tobacco: Former Systems developer  . Tobacco comment: quit 1979  Substance Use Topics  . Alcohol use: No    Comment: previously drank heavily - quit 1979.   . Drug use: No    Family History  Problem Relation Age of Onset  . Brain cancer Father   . Diabetes Sister   . Heart attack Sister     Review of Systems: A 12-system review of systems was performed and was negative except as noted in the HPI.  --------------------------------------------------------------------------------------------------  Physical Exam: BP 134/78 (BP Location: Left Arm, Patient Position: Sitting, Cuff Size: Normal)   Pulse 81   Ht 5\' 4"  (1.626 m)   Wt 195 lb 8 oz (88.7 kg)   BMI 33.56 kg/m   General: NAD.  Accompanied by his wife. HEENT: Mild conjunctival pallor noted. Moist mucous membranes.  OP clear. Neck: Supple without lymphadenopathy, thyromegaly, JVD, or HJR. No carotid bruit. Lungs: Normal work of breathing. Clear to auscultation bilaterally without wheezes or crackles. Heart: Regular rate and rhythm without murmurs, rubs, or gallops. Non-displaced PMI. Abd: Bowel sounds present. Soft, NT/ND without hepatosplenomegaly Ext: No lower extremity edema.  Skin: Warm and dry without rash.  EKG: Normal sinus rhythm with isolated PVC and bifascicular block (LAFB and RBBB).  No significant change since 01/14/2018.  Lab Results  Component Value Date   WBC 8.3 01/14/2018   HGB 8.5 (L) 01/14/2018   HCT 25.6 (L) 01/14/2018   MCV 88.3 01/14/2018   PLT 152 01/14/2018    Lab Results  Component Value Date   NA 138 01/14/2018   K 3.8 01/14/2018   CL 107 01/14/2018   CO2 21 (L) 01/14/2018   BUN 8 01/14/2018   CREATININE 1.01 01/14/2018   GLUCOSE 152 (H) 01/14/2018   ALT 31 01/11/2018    Lab Results  Component Value Date   CHOL 99 01/08/2017   HDL 21 (L) 01/08/2017   LDLCALC 54 01/08/2017   TRIG 118 01/08/2017   CHOLHDL 4.7 01/08/2017    --------------------------------------------------------------------------------------------------  ASSESSMENT AND PLAN: Acute GI bleed Likely multifactorial including multiple duodenal ulcers as well as  oozing from previously treated angioid ectasia of the colon.  Mr. Till has not had any further melena or hematochezia.  [name] asymptomatic other than mild shortness of breath in the mornings, which has been long-standing and is likely multifactorial.  [name] scheduled for follow-up with his gastroenterologist today.  We will defer restarting warfarin until cleared by his gastroenterologist.  For now, we will continue with low-dose aspirin, as long as he tolerates this.  I will defer rechecking hemoglobin to his gastroenterologist and PCP.  He should continue omeprazole.  Coronary artery disease No angina.  EKG stable with bifascicular block.  Continue low-dose aspirin given history of DES to the  RCA and moderate LMCA disease.  We will also continue secondary prevention with atorvastatin.  Paroxysmal atrial fibrillation No symptoms to suggest recurrence.  EKG today again shows sinus rhythm.  Given severe GI bleed last month, we will continue to defer addition of warfarin.  However, given a CHADSVASC score of at least 6, he is at high risk for cardioembolic events in the future.  I would appreciate guidance from gastroenterology regarding risk for future GI bleeds and the potential for restarting warfarin in the future.  HFpEF Mr. Bais appears euvolemic and well compensated with NYHA class II symptoms.  Given that his weight is stable and he has orthostatic lightheadedness at times, I have encouraged him to continue taking torsemide 20 mg daily and to drink plenty of water.  He was encouraged to continue to minimize his salt intake.  Hypertension Blood pressure mildly elevated today (goal less than 130/80).  We will continue current doses of enalapril and metoprolol.  In light of his occasional orthostatic lightheadedness, I am hesitant to escalate therapy further at this time.  Follow-up: Return to clinic in 6 weeks.  If cleared by GI and normalized hemoglobin, reinitiation of anticoagulation will  need to be considered  [name], MD 01/23/2018 8:25 AM

## 2018-01-26 ENCOUNTER — Telehealth: Payer: Self-pay | Admitting: Gastroenterology

## 2018-01-26 NOTE — Telephone Encounter (Signed)
PT wife is calling to let Dr. Marius Ditch know pt is constipated he is not having any bowel movements please call [phone]

## 2018-01-27 ENCOUNTER — Inpatient Hospital Stay: Payer: Medicare HMO | Admitting: Family Medicine

## 2018-01-30 ENCOUNTER — Encounter (INDEPENDENT_AMBULATORY_CARE_PROVIDER_SITE_OTHER): Payer: Self-pay | Admitting: Vascular Surgery

## 2018-01-30 ENCOUNTER — Ambulatory Visit (INDEPENDENT_AMBULATORY_CARE_PROVIDER_SITE_OTHER): Payer: Medicare HMO | Admitting: Vascular Surgery

## 2018-01-30 VITALS — BP 140/69 | HR 82 | Resp 20 | Ht 64.0 in | Wt 193.0 lb

## 2018-01-30 DIAGNOSIS — N183 Chronic kidney disease, stage 3 unspecified: Secondary | ICD-10-CM

## 2018-01-30 DIAGNOSIS — IMO0002 Reserved for concepts with insufficient information to code with codable children: Secondary | ICD-10-CM

## 2018-01-30 DIAGNOSIS — E1121 Type 2 diabetes mellitus with diabetic nephropathy: Secondary | ICD-10-CM

## 2018-01-30 DIAGNOSIS — E782 Mixed hyperlipidemia: Secondary | ICD-10-CM

## 2018-01-30 DIAGNOSIS — E1142 Type 2 diabetes mellitus with diabetic polyneuropathy: Secondary | ICD-10-CM | POA: Diagnosis not present

## 2018-01-30 DIAGNOSIS — I10 Essential (primary) hypertension: Secondary | ICD-10-CM

## 2018-01-30 DIAGNOSIS — K2980 Duodenitis without bleeding: Secondary | ICD-10-CM

## 2018-01-30 DIAGNOSIS — E1165 Type 2 diabetes mellitus with hyperglycemia: Secondary | ICD-10-CM

## 2018-01-30 NOTE — Assessment & Plan Note (Signed)
blood pressure control important in reducing the progression of atherosclerotic disease. On appropriate oral medications.  

## 2018-01-30 NOTE — Progress Notes (Signed)
Patient ID: Lucas Caldwell, male   DOB: [birth year], 84 y.o.   MRN: [id number]  Chief Complaint  Patient presents with  . New Patient (Initial Visit)    Ischemic duodenitis    HPI Lucas Caldwell is a 84 y.o. male.  I am asked to see the patient by Dr. Marius Ditch for evaluation of ischemic duodinitis.  The patient reports several weeks ago having a large amount of bright red blood per rectum and diarrhea.  He was ultimately found to have multiple gastric ulcers and duodenal ulcers.  He had endoscopies and his biopsy suggest ischemic duodenitis.  The patient reports that since discharge she is not really had any further issues.  He does have reflux issues and some issues with food hanging up but he also had a Schatzki's ring.  He has not had any major postprandial abdominal pain, unintentional weight loss, or food fear.   Past Medical History:  Diagnosis Date  . Arthritis    "probably in his legs" (08/15/2016)  . Blind right eye   . BPH (benign prostatic hyperplasia)   . Breast asymmetry    Left breast is larger, present for several years.  [name] CAD (coronary artery disease)    a. Cath in the late 90's - reportedly ok;  b. 2014 s/p stenting x 2 @ [facility]; c 08/19/16 Cath/PCI with DES -> RCA, plan to treat LM 70% medically. Seen by surgery and felt to be too high risk for CABG.  . Chronic combined systolic (congestive) and diastolic (congestive) heart failure ([place])    a. Previously reduced EF-->50% by echo in 2012;  b. 06/2015 Echo: EF 50-55%  c. 07/2016 Echo: EF 45-50%.  . Chronic kidney disease (CKD), stage IV (severe) ([place])   . CML (chronic myelocytic leukemia) ([place])   . GERD (gastroesophageal reflux disease)   . Gout   . Hyperlipemia   . Hypertension   . Hypothyroidism   . Iron deficiency anemia   . Migraine    "in the 1960s" (08/15/2016)  . NICM (nonischemic cardiomyopathy) ([place])    a. Previously reduced EF-->50% by echo in 2012;  b. 06/2015 Echo: EF 50-55%, Gr2 DD, mild MR, mildly dil LA,  Ao sclerosis, mild TR;  c. 07/2016 Echo: EF 45-50%, ? inf HK, mild MR, mildly dil LA.  [name] PAF (paroxysmal atrial fibrillation) ([facility])    a. ? Dx 2014-->s/p DCCV;  b. CHA2DS2VASc = 6-->chronic coumadin.  . Prostate cancer ([place])   . RBBB   . Type II diabetes mellitus ([place])     Past Surgical History:  Procedure Laterality Date  . CATARACT EXTRACTION W/ INTRAOCULAR LENS  IMPLANT, BILATERAL Bilateral   . COLONOSCOPY N/A 01/13/2018   Procedure: COLONOSCOPY;  Surgeon: Lin Landsman, MD;  Location: [facility] ENDOSCOPY;  Service: Gastroenterology;  Laterality: N/A;  . COLONOSCOPY WITH PROPOFOL N/A 01/01/2018   Procedure: COLONOSCOPY WITH PROPOFOL;  Surgeon: Lin Landsman, MD;  Location: [facility] ENDOSCOPY;  Service: Gastroenterology;  Laterality: N/A;  . CORONARY ANGIOPLASTY WITH STENT PLACEMENT  09/2012   2 stents  . CORONARY STENT INTERVENTION N/A 08/19/2016   Procedure: Coronary Stent Intervention;  Surgeon: [name], MD;  Location: [place] CV LAB;  Service: Cardiovascular;  Laterality: N/A;  . ESOPHAGEAL MANOMETRY N/A 12/16/2017   Procedure: ESOPHAGEAL MANOMETRY (EM);  Surgeon: Lin Landsman, MD;  Location: [facility] ENDOSCOPY;  Service: Gastroenterology;  Laterality: N/A;  . ESOPHAGOGASTRODUODENOSCOPY N/A 12/20/2016   Procedure: ESOPHAGOGASTRODUODENOSCOPY (EGD);  Surgeon: Lin Landsman, MD;  Location: [facility] ENDOSCOPY;  Service: Gastroenterology;  Laterality: N/A;  . ESOPHAGOGASTRODUODENOSCOPY N/A 01/13/2018   Procedure: ESOPHAGOGASTRODUODENOSCOPY (EGD);  Surgeon: Lin Landsman, MD;  Location: [facility] ENDOSCOPY;  Service: Gastroenterology;  Laterality: N/A;  . ESOPHAGOGASTRODUODENOSCOPY (EGD) WITH PROPOFOL N/A 11/03/2017   Procedure: ESOPHAGOGASTRODUODENOSCOPY (EGD) WITH PROPOFOL;  Surgeon: Lin Landsman, MD;  Location: [facility] ENDOSCOPY;  Service: Gastroenterology;  Laterality: N/A;  . EYE SURGERY    . HERNIA REPAIR     "[place]"  . LEFT HEART CATH AND CORONARY ANGIOGRAPHY  N/A 08/14/2016   Procedure: Left Heart Cath and Coronary Angiography;  Surgeon: [name], MD;  Location: [place] CV LAB;  Service: Cardiovascular;  Laterality: N/A;  . TOE AMPUTATION Right    "big toe"    Family History  Problem Relation Age of Onset  . Brain cancer Father   . Diabetes Sister   . Heart attack Sister   no bleeding or clotting disorders  Social History Social History   Tobacco Use  . Smoking status: Former Smoker    Packs/day: 2.00    Years: 15.00    Pack years: 30.00    Types: Cigarettes  . Smokeless tobacco: Former Systems developer  . Tobacco comment: quit 1979  Substance Use Topics  . Alcohol use: No    Comment: previously drank heavily - quit 1979.  . Drug use: No    Allergies  Allergen Reactions  . Clopidogrel Anaphylaxis    Other reaction(s): ANAPHYLAXIS;altered mental status;  (electrolytes "out of wack" and confusion per family; THEY DO NOT REMEMBER ANY OTHER REACTION)- MSB 10/10/15  . Ciprofloxacin Itching  . Mirtazapine Diarrhea    Bad dreams Bad dreams  . Spironolactone     Other reaction(s): Other (See Comments) gynecomastia    Current Outpatient Medications  Medication Sig Dispense Refill  . allopurinol (ZYLOPRIM) 100 MG tablet Take 100 mg by mouth daily.  12  . aspirin EC 81 MG tablet Take 1 tablet (81 mg total) by mouth daily. 90 tablet 3  . bosutinib (BOSULIF) 100 MG tablet Take 200 mg by mouth at bedtime. Take with food.     . Dulaglutide (TRULICITY) 0.62 IR/4.8NI SOPN Inject 0.75 mg into the skin once a week. 4 pen 0  . fluticasone furoate-vilanterol (BREO ELLIPTA) 100-25 MCG/INH AEPB Inhale 1 puff into the lungs daily. 28 each 5  . gabapentin (NEURONTIN) 300 MG capsule Take 2 capsules in morning, 1 capsule in afternoon, 2 capsules at bedtime. In future increase up to max of 3 capsules 3 times daily 180 capsule 0  . Insulin Pen Needle 31G X 5 MM MISC 31 g by Does not apply route as directed. 100 each 5  . levothyroxine (SYNTHROID,  LEVOTHROID) 50 MCG tablet Take 25 mcg by mouth daily before breakfast.     . loperamide (IMODIUM A-D) 2 MG tablet Take 2 mg by mouth 3 (three) times daily as needed for diarrhea or loose stools.     . Multiple Vitamins-Minerals (MULTIVITAMIN ADULTS 50+ PO) Take 1 tablet by mouth every morning.    . nepafenac (NEVANAC) 0.1 % ophthalmic suspension Place 1 drop into both eyes 3 (three) times daily as needed.    [name] omeprazole (PRILOSEC) 40 MG capsule Take 1 capsule (40 mg total) by mouth 2 (two) times daily. 60 capsule 0  . ranitidine (ZANTAC) 300 MG tablet Take 1 tablet (300 mg total) by mouth daily. 90 tablet 3  . Spacer/Aero-Holding Chambers (OPTICHAMBER ADVANTAGE) MISC 1 each by Other route once. 1  each 0  . torsemide (DEMADEX) 20 MG tablet Take 20 mg by mouth daily.     [name] acetaminophen (TYLENOL) 500 MG tablet Take 500 mg by mouth every 6 (six) hours as needed.    [name] albuterol (PROVENTIL HFA;VENTOLIN HFA) 108 (90 Base) MCG/ACT inhaler Inhale 2 puffs into the lungs every 6 (six) hours as needed for wheezing or shortness of breath. (Patient not taking: Reported on 01/30/2018) 1 Inhaler 2  . Alcohol Swabs 70 % PADS Use when checking blood sugar up to 3x daily (Patient not taking: Reported on 01/30/2018) 100 each 11  . atorvastatin (LIPITOR) 80 MG tablet Take 1 tablet (80 mg total) by mouth daily. (Patient not taking: Reported on 01/30/2018) 90 tablet 1  . enalapril (VASOTEC) 5 MG tablet Take 1 tablet (5 mg total) by mouth daily. (Patient not taking: Reported on 01/30/2018) 90 tablet 3  . metoprolol succinate (TOPROL-XL) 50 MG 24 hr tablet Take 1 tablet (50 mg total) by mouth every morning. (Patient not taking: Reported on 01/30/2018) 90 tablet 3  . nitroGLYCERIN (NITROSTAT) 0.4 MG SL tablet Place 1 tablet (0.4 mg total) under the tongue every 5 (five) minutes x 3 doses as needed for chest pain. (Patient not taking: Reported on 01/30/2018) 75 tablet 2   No current facility-administered medications for this  visit.       REVIEW OF SYSTEMS (Negative unless checked)  Constitutional: '[]' Weight loss  '[]' Fever  '[]' Chills Cardiac: '[]' Chest pain   '[]' Chest pressure   '[x]' Palpitations   '[]' Shortness of breath when laying flat   '[]' Shortness of breath at rest   '[x]' Shortness of breath with exertion. Vascular:  '[]' Pain in legs with walking   '[]' Pain in legs at rest   '[]' Pain in legs when laying flat   '[]' Claudication   '[]' Pain in feet when walking  '[]' Pain in feet at rest  '[]' Pain in feet when laying flat   '[]' History of DVT   '[]' Phlebitis   '[]' Swelling in legs   '[]' Varicose veins   '[]' Non-healing ulcers Pulmonary:   '[]' Uses home oxygen   '[]' Productive cough   '[]' Hemoptysis   '[]' Wheeze  '[]' COPD   '[]' Asthma Neurologic:  '[]' Dizziness  '[]' Blackouts   '[]' Seizures   '[]' History of stroke   '[]' History of TIA  '[]' Aphasia   '[]' Temporary blindness   '[]' Dysphagia   '[]' Weakness or numbness in arms   '[]' Weakness or numbness in legs Musculoskeletal:  '[x]' Arthritis   '[]' Joint swelling   '[]' Joint pain   '[]' Low back pain Hematologic:  '[]' Easy bruising  '[]' Easy bleeding   '[]' Hypercoagulable state   '[]' Anemic  '[]' Hepatitis Gastrointestinal:  '[x]' Blood in stool   '[]' Vomiting blood  '[x]' Gastroesophageal reflux/heartburn   '[x]' Abdominal pain Genitourinary:  '[]' Chronic kidney disease   '[]' Difficult urination  '[]' Frequent urination  '[]' Burning with urination   '[]' Hematuria Skin:  '[]' Rashes   '[]' Ulcers   '[]' Wounds Psychological:  '[]' History of anxiety   '[]'  History of major depression.    Physical Exam BP 140/69 (BP Location: Right Arm, Patient Position: Sitting)   Pulse 82   Resp 20   Ht '5\' 4"'  (1.626 m)   Wt 193 lb (87.5 kg)   BMI 33.13 kg/m  Gen:  WD/WN, NAD Head: [place]/AT, No temporalis wasting.  Ear/Nose/Throat: Hearing grossly intact, nares w/o erythema or drainage, oropharynx w/o Erythema/Exudate Eyes: Conjunctiva clear, sclera non-icteric  Neck: trachea midline. Pulmonary:  Good air movement, respirations not labored, no use of accessory muscles Cardiac: RRR, no  JVD Vascular:  Vessel Right Left  Radial Palpable Palpable  Gastrointestinal: soft, non-tender/non-distended. No guarding/reflex. Musculoskeletal: M/S 5/5 throughout.  Extremities without ischemic changes.  No deformity or atrophy. No edema. Neurologic: Sensation grossly intact in extremities.  Symmetrical.  Speech is fluent. Motor exam as listed above. Psychiatric: Judgment intact, Mood & affect appropriate for pt's clinical situation. Dermatologic: No rashes or ulcers noted.  No cellulitis or open wounds.   Radiology Nm Gi Blood Loss  Result Date: 01/11/2018 CLINICAL DATA:  Several episodes of rectal bleeding/melena. Near syncope. Patient takes Coumadin. No GI Surgeries Colonoscopy 2 weeks ago Given 2 units of Blood: Finished at 11am Most Recent Bleeding: 10am EXAM: NUCLEAR MEDICINE GASTROINTESTINAL BLEEDING SCAN TECHNIQUE: Sequential abdominal images were obtained following intravenous administration of Tc-47mlabeled red blood cells. RADIOPHARMACEUTICALS:  22.04 mCi Tc-979mertechnetate in-vitro labeled red cells. COMPARISON:  CT of the abdomen and pelvis on 06/20/2016 FINDINGS: There is expected blood pool activity. No activity identified within bowel loops to indicate the location of GI bleeding. IMPRESSION: Negative GI bleeding scan. Electronically Signed   By: ElNolon Nations.D.   On: 01/11/2018 15:48   Dg Chest Portable 1 View  Result Date: 01/11/2018 CLINICAL DATA:  Central line placement. EXAM: PORTABLE CHEST 1 VIEW COMPARISON:  08/27/2017 FINDINGS: Right internal jugular central venous line tip projects in the upper superior vena cava. No pneumothorax. Cardiac silhouette is normal in size. No mediastinal or hilar masses or convincing adenopathy. Clear lungs. IMPRESSION: 1. Right internal jugular central venous line tip projects in the upper superior vena cava. No pneumothorax. 2. No acute cardiopulmonary disease. Electronically Signed   By:  DaLajean Manes.D.   On: 01/11/2018 11:05   [place] Site Rite  Result Date: 01/11/2018 If Site Rite image not attached, placement could not be confirmed due to current cardiac rhythm.   Labs Recent Results (from the past 2160 hour(s))  Glucose, capillary     Status: Abnormal   Collection Time: 11/03/17 10:08 AM  Result Value Ref Range   Glucose-Capillary 193 (H) 70 - 99 mg/dL   Comment 1 IN EPIC   Glucose, capillary     Status: Abnormal   Collection Time: 11/03/17 12:35 PM  Result Value Ref Range   Glucose-Capillary 159 (H) 70 - 99 mg/dL  POCT INR     Status: Abnormal   Collection Time: 11/19/17  4:20 PM  Result Value Ref Range   INR 3.5 (A) 2.0 - 3.0  POCT INR     Status: Abnormal   Collection Time: 12/10/17  2:28 PM  Result Value Ref Range   INR 4.1 (A) 2.0 - 3.0  POCT INR     Status: None   Collection Time: 12/24/17  4:07 PM  Result Value Ref Range   INR 2.1 2.0 - 3.0  Glucose, capillary     Status: Abnormal   Collection Time: 01/01/18  8:56 AM  Result Value Ref Range   Glucose-Capillary 151 (H) 70 - 99 mg/dL  POCT INR     Status: Abnormal   Collection Time: 01/07/18  3:59 PM  Result Value Ref Range   INR 1.5 (A) 2.0 - 3.0  CBC with Differential     Status: Abnormal   Collection Time: 01/11/18  1:26 AM  Result Value Ref Range   WBC 8.4 4.0 - 10.5 K/uL   RBC 3.69 (L) 4.22 - 5.81 MIL/uL   Hemoglobin 10.2 (L) 13.0 - 17.0 g/dL   HCT 32.9 (L) 39.0 - 52.0 %   MCV 89.2 80.0 - 100.0 fL  MCH 27.6 26.0 - 34.0 pg   MCHC 31.0 30.0 - 36.0 g/dL   RDW 14.9 11.5 - 15.5 %   Platelets 225 150 - 400 K/uL   nRBC 0.0 0.0 - 0.2 %   Neutrophils Relative % 53 %   Neutro Abs 4.5 1.7 - 7.7 K/uL   Lymphocytes Relative 33 %   Lymphs Abs 2.8 0.7 - 4.0 K/uL   Monocytes Relative 11 %   Monocytes Absolute 0.9 0.1 - 1.0 K/uL   Eosinophils Relative 2 %   Eosinophils Absolute 0.2 0.0 - 0.5 K/uL   Basophils Relative 1 %   Basophils Absolute 0.1 0.0 - 0.1 K/uL   Immature Granulocytes 0 %     Abs Immature Granulocytes 0.03 0.00 - 0.07 K/uL    Comment: Performed at [facility], [address] [place], [place] [address]  Comprehensive metabolic panel     Status: Abnormal   Collection Time: 01/11/18  1:26 AM  Result Value Ref Range   Sodium 135 135 - 145 mmol/L   Potassium 4.5 3.5 - 5.1 mmol/L   Chloride 104 98 - 111 mmol/L   CO2 24 22 - 32 mmol/L   Glucose, Bld 263 (H) 70 - 99 mg/dL   BUN 23 8 - 23 mg/dL   Creatinine, Ser 1.43 (H) 0.61 - 1.24 mg/dL   Calcium 8.9 8.9 - 10.3 mg/dL   Total Protein 6.4 (L) 6.5 - 8.1 g/dL   Albumin 3.2 (L) 3.5 - 5.0 g/dL   AST 42 (H) 15 - 41 U/L   ALT 31 0 - 44 U/L   Alkaline Phosphatase 52 38 - 126 U/L   Total Bilirubin 0.5 0.3 - 1.2 mg/dL   GFR calc non Af Amer 44 (L) >60 mL/min   GFR calc Af Amer 51 (L) >60 mL/min    Comment: (NOTE) The eGFR has been calculated using the CKD EPI equation. This calculation has not been validated in all clinical situations. eGFR's persistently <60 mL/min signify possible Chronic Kidney Disease.    Anion gap 7 5 - 15    Comment: Performed at [facility], [address] [place], [place] [address]  Troponin I     Status: None   Collection Time: 01/11/18  1:26 AM  Result Value Ref Range   Troponin I <0.03 <0.03 ng/mL    Comment: Performed at [facility], [address] [place], [place] [address]  Lipase, blood     Status: None   Collection Time: 01/11/18  1:26 AM  Result Value Ref Range   Lipase 37 11 - 51 U/L    Comment: Performed at [facility], [address] [place], [place] [address]  Protime-INR     Status: Abnormal   Collection Time: 01/11/18  1:26 AM  Result Value Ref Range   Prothrombin Time 25.4 (H) 11.4 - 15.2 seconds   INR 2.35     Comment: Performed at [facility], [address], [place], [place] [address]  ABO/Rh     Status: None   Collection Time: 01/11/18  1:26 AM  Result Value Ref Range   ABO/RH(D)      AB NEG Performed  at [facility], [address] [place], [place] [address]   Type and screen Ordered by PROVIDER DEFAULT     Status: None   Collection Time: 01/11/18  2:03 AM  Result Value Ref Range   ABO/RH(D) AB NEG    Antibody Screen NEG  Sample Expiration 01/14/2018    Unit Number [id number]    Blood Component Type RED CELLS,LR    Unit division 00    Status of Unit ISSUED,FINAL    Transfusion Status OK TO TRANSFUSE    Crossmatch Result Compatible    Unit Number [id number]    Blood Component Type RED CELLS,LR    Unit division 00    Status of Unit REL FROM [facility]    Transfusion Status OK TO TRANSFUSE    Crossmatch Result      Compatible Performed at [facility], [address] [place], [place] [address]    Unit Number [id number]    Blood Component Type RED CELLS,LR    Unit division 00    Status of Unit ISSUED,FINAL    Transfusion Status OK TO TRANSFUSE    Crossmatch Result Compatible    Unit Number [id number]    Blood Component Type RED CELLS,LR    Unit division 00    Status of Unit REL FROM [facility]    Transfusion Status OK TO TRANSFUSE    Crossmatch Result Compatible    Unit Number [id number]    Blood Component Type RBC, LR IRR    Unit division 00    Status of Unit ISSUED,FINAL    Transfusion Status OK TO TRANSFUSE    Crossmatch Result Compatible    Unit Number [id number]    Blood Component Type RED CELLS,LR    Unit division 00    Status of Unit REL FROM [facility]    Transfusion Status OK TO TRANSFUSE    Crossmatch Result Compatible   BPAM RBC     Status: None   Collection Time: 01/11/18  2:03 AM  Result Value Ref Range   ISSUE DATE / TIME 160109323557    Blood Product Unit Number [id number]    PRODUCT CODE [id number]    Unit Type and Rh 0600    Blood Product Expiration Date 761607371062    Blood Product Unit Number [id number]    Unit Type and Rh 0600    Blood Product Expiration Date 093818299371    ISSUE DATE / TIME 696789381017      Blood Product Unit Number [id number]    PRODUCT CODE [id number]    Unit Type and Rh 0600    Blood Product Expiration Date 235361443154    ISSUE DATE / TIME 008676195093    Blood Product Unit Number [id number]    PRODUCT CODE [id number]    Unit Type and Rh 0600    Blood Product Expiration Date 382505397673    ISSUE DATE / TIME 419379024097    Blood Product Unit Number [id number]    PRODUCT CODE [id number]    Unit Type and Rh 0600    Blood Product Expiration Date 989211941740    ISSUE DATE / TIME 814481856314    Blood Product Unit Number [id number]    PRODUCT CODE [id number]    Unit Type and Rh 0600    Blood Product Expiration Date 867672094709   Glucose, capillary     Status: Abnormal   Collection Time: 01/11/18  2:21 AM  Result Value Ref Range   Glucose-Capillary 278 (H) 70 - 99 mg/dL  Magnesium     Status: None   Collection Time: 01/11/18  5:14 AM  Result Value Ref Range   Magnesium 1.7 1.7 - 2.4 mg/dL    Comment: Performed at [facility], [address], [place], [place] [address]  Phosphorus     Status: None   Collection Time:  01/11/18  5:14 AM  Result Value Ref Range   Phosphorus 2.5 2.5 - 4.6 mg/dL    Comment: Performed at [facility], [address] [place], [place] [address]  Prealbumin     Status: Abnormal   Collection Time: 01/11/18  5:14 AM  Result Value Ref Range   Prealbumin 17.4 (L) 18 - 38 mg/dL    Comment: Performed at [place] [address] [place], [place] [address]  Troponin I     Status: None   Collection Time: 01/11/18  5:14 AM  Result Value Ref Range   Troponin I <0.03 <0.03 ng/mL    Comment: Performed at [facility], [address] [place], [place] [address]  Prepare fresh frozen plasma     Status: None   Collection Time: 01/11/18  6:32 AM  Result Value Ref Range   Unit Number [id number]    Blood Component Type THW PLS APHR    Unit division 00    Status of Unit REL FROM [facility]     Transfusion Status OK TO TRANSFUSE   BPAM FFP     Status: None   Collection Time: 01/11/18  6:32 AM  Result Value Ref Range   Blood Product Unit Number [id number]    Unit Type and Rh 8400    Blood Product Expiration Date 620355974163   Prepare RBC     Status: None   Collection Time: 01/11/18  6:33 AM  Result Value Ref Range   Order Confirmation      ORDER PROCESSED BY BLOOD BANK Performed at [facility], [address], [place], [place] [address]   Hemoglobin     Status: Abnormal   Collection Time: 01/11/18  7:24 AM  Result Value Ref Range   Hemoglobin 8.1 (L) 13.0 - 17.0 g/dL    Comment: Performed at [facility], [address] [place], [place] [address]  Protime-INR     Status: Abnormal   Collection Time: 01/11/18 10:15 AM  Result Value Ref Range   Prothrombin Time 16.7 (H) 11.4 - 15.2 seconds   INR 1.37     Comment: Performed at [facility], [address] [place], [place] [address]  Hemoglobin     Status: Abnormal   Collection Time: 01/11/18 11:32 AM  Result Value Ref Range   Hemoglobin 9.9 (L) 13.0 - 17.0 g/dL    Comment: Performed at [facility], [address] [place], [place] [address]  Hemoglobin and hematocrit, blood     Status: Abnormal   Collection Time: 01/11/18  2:05 PM  Result Value Ref Range   Hemoglobin 9.9 (L) 13.0 - 17.0 g/dL   HCT 30.5 (L) 39.0 - 52.0 %    Comment: Performed at [facility], [address] [place], [place] [address]  T4, free     Status: None   Collection Time: 01/11/18  2:05 PM  Result Value Ref Range   Free T4 1.10 0.82 - 1.77 ng/dL    Comment: (NOTE) Biotin ingestion may interfere with free T4 tests. If the results are inconsistent with the TSH level, previous test results, or the clinical presentation, then consider biotin interference. If needed, order repeat testing after stopping biotin. Performed at [facility], [address] [place], [place] [address]    Glucose, capillary     Status: Abnormal   Collection Time: 01/11/18  3:48 PM  Result Value Ref Range   Glucose-Capillary 259 (H) 70 - 99 mg/dL  Protime-INR     Status: Abnormal   Collection Time: 01/11/18  4:10 PM  Result Value Ref Range   Prothrombin Time 16.6 (H) 11.4 - 15.2 seconds   INR 1.36     Comment: Performed at [facility], [address] [place], [place] [address]  MRSA PCR Screening     Status: None   Collection Time: 01/11/18  4:41 PM  Result Value Ref Range   MRSA by PCR NEGATIVE NEGATIVE    Comment:        The GeneXpert MRSA Assay (FDA approved for NASAL specimens only), is one component of a comprehensive MRSA colonization surveillance program. It is not intended to diagnose MRSA infection nor to guide or monitor treatment for MRSA infections. Performed at [facility], [address] [place], [place] [address]   Hemoglobin and hematocrit, blood     Status: Abnormal   Collection Time: 01/11/18  5:19 PM  Result Value Ref Range   Hemoglobin 9.8 (L) 13.0 - 17.0 g/dL   HCT 30.3 (L) 39.0 - 52.0 %    Comment: Performed at [facility], [address] [place], [place] [address]  Protime-INR     Status: Abnormal   Collection Time: 01/11/18  9:25 PM  Result Value Ref Range   Prothrombin Time 16.3 (H) 11.4 - 15.2 seconds   INR 1.33     Comment: Performed at [facility], [address] [place], [place] [address]  Hemoglobin and hematocrit, blood     Status: Abnormal   Collection Time: 01/11/18  9:25 PM  Result Value Ref Range   Hemoglobin 9.4 (L) 13.0 - 17.0 g/dL   HCT 28.1 (L) 39.0 - 52.0 %    Comment: Performed at [facility], [address] [place], [place] [address]  Glucose, capillary     Status: Abnormal   Collection Time: 01/11/18  9:42 PM  Result Value Ref Range   Glucose-Capillary 244 (H) 70 - 99 mg/dL   Comment 1 Notify RN    Comment 2 Document in Chart   Hemoglobin and hematocrit, blood      Status: Abnormal   Collection Time: 01/12/18  1:40 AM  Result Value Ref Range   Hemoglobin 8.5 (L) 13.0 - 17.0 g/dL   HCT 26.1 (L) 39.0 - 52.0 %    Comment: Performed at [facility], [address], [place], [place] [address]  Protime-INR     Status: None   Collection Time: 01/12/18  4:19 AM  Result Value Ref Range   Prothrombin Time 15.1 11.4 - 15.2 seconds   INR 1.20     Comment: Performed at [facility], [address] [place], [place] [address]  Hemoglobin and hematocrit, blood     Status: Abnormal   Collection Time: 01/12/18  6:18 AM  Result Value Ref Range   Hemoglobin 8.1 (L) 13.0 - 17.0 g/dL   HCT 24.8 (L) 39.0 - 52.0 %    Comment: Performed at [facility], [address] [place],  [address]  Glucose, capillary     Status: Abnormal   Collection Time: 01/12/18  7:19 AM  Result Value Ref Range   Glucose-Capillary 195 (H) 70 - 99 mg/dL  ECHOCARDIOGRAM COMPLETE     Status: None   Collection Time: 01/12/18  9:38 AM  Result Value Ref Range   Weight 3,343.94 oz   Height 66 in   BP 135/77 mmHg  Hemoglobin and hematocrit, blood  Status: Abnormal   Collection Time: 01/12/18 10:19 AM  Result Value Ref Range   Hemoglobin 8.0 (L) 13.0 - 17.0 g/dL   HCT 24.5 (L) 39.0 - 52.0 %    Comment: Performed at [facility], [address] [place], [place] [address]  Protime-INR     Status: Abnormal   Collection Time: 01/12/18 10:19 AM  Result Value Ref Range   Prothrombin Time 15.9 (H) 11.4 - 15.2 seconds   INR 1.28     Comment: Performed at [facility], [address] [place], [place] [address]  Basic metabolic panel     Status: Abnormal   Collection Time: 01/12/18 10:19 AM  Result Value Ref Range   Sodium 136 135 - 145 mmol/L   Potassium 4.0 3.5 - 5.1 mmol/L   Chloride 110 98 - 111 mmol/L   CO2 22 22 - 32 mmol/L   Glucose, Bld 185 (H) 70 - 99 mg/dL   BUN 23 8 - 23 mg/dL   Creatinine, Ser 1.30 (H) 0.61 - 1.24 mg/dL     Calcium 8.0 (L) 8.9 - 10.3 mg/dL   GFR calc non Af Amer 49 (L) >60 mL/min   GFR calc Af Amer 57 (L) >60 mL/min    Comment: (NOTE) The eGFR has been calculated using the CKD EPI equation. This calculation has not been validated in all clinical situations. eGFR's persistently <60 mL/min signify possible Chronic Kidney Disease.    Anion gap 4 (L) 5 - 15    Comment: Performed at [facility], [address] [place], [place] [address]  Ferritin     Status: None   Collection Time: 01/12/18 10:19 AM  Result Value Ref Range   Ferritin 194 24 - 336 ng/mL    Comment: Performed at [facility], [address] [place], [place] [address]  Iron and TIBC     Status: None   Collection Time: 01/12/18 10:19 AM  Result Value Ref Range   Iron 98 45 - 182 ug/dL   TIBC 272 250 - 450 ug/dL   Saturation Ratios 36 17.9 - 39.5 %   UIBC 174 ug/dL    Comment: Performed at [facility], [address] [place], [place] [address]  Folate     Status: None   Collection Time: 01/12/18 10:19 AM  Result Value Ref Range   Folate 9.0 >5.9 ng/mL    Comment: Performed at [facility], [address] [place], [place] [address]  Glucose, capillary     Status: Abnormal   Collection Time: 01/12/18 11:24 AM  Result Value Ref Range   Glucose-Capillary 167 (H) 70 - 99 mg/dL   Comment 1 Notify RN    Comment 2 Document in Chart   Vitamin B12     Status: None   Collection Time: 01/12/18  3:03 PM  Result Value Ref Range   Vitamin B-12 380 180 - 914 pg/mL    Comment: (NOTE) This assay is not validated for testing neonatal or myeloproliferative syndrome specimens for Vitamin B12 levels. Performed at [facility], [place] [address] [place] [address]   CBC     Status: Abnormal   Collection Time: 01/12/18  3:03 PM  Result Value Ref Range   WBC 10.7 (H) 4.0 - 10.5 K/uL   RBC 2.69 (L) 4.22 - 5.81 MIL/uL   Hemoglobin 8.1 (L) 13.0 - 17.0 g/dL   HCT 24.5 (L) 39.0 -  52.0 %   MCV 91.1  80.0 - 100.0 fL   MCH 30.1 26.0 - 34.0 pg   MCHC 33.1 30.0 - 36.0 g/dL   RDW 14.8 11.5 - 15.5 %   Platelets 132 (L) 150 - 400 K/uL   nRBC 0.2 0.0 - 0.2 %    Comment: Performed at [facility], [address], [place] [address]  Glucose, capillary     Status: Abnormal   Collection Time: 01/12/18  5:15 PM  Result Value Ref Range   Glucose-Capillary 199 (H) 70 - 99 mg/dL  Glucose, capillary     Status: Abnormal   Collection Time: 01/12/18  9:20 PM  Result Value Ref Range   Glucose-Capillary 165 (H) 70 - 99 mg/dL   Comment 1 Notify RN   CBC     Status: Abnormal   Collection Time: 01/13/18  5:19 AM  Result Value Ref Range   WBC 9.1 4.0 - 10.5 K/uL   RBC 2.44 (L) 4.22 - 5.81 MIL/uL   Hemoglobin 7.4 (L) 13.0 - 17.0 g/dL   HCT 22.2 (L) 39.0 - 52.0 %   MCV 91.0 80.0 - 100.0 fL   MCH 30.3 26.0 - 34.0 pg   MCHC 33.3 30.0 - 36.0 g/dL   RDW 14.6 11.5 - 15.5 %   Platelets 127 (L) 150 - 400 K/uL   nRBC 0.5 (H) 0.0 - 0.2 %    Comment: Performed at [facility], [address] [place], [place] [address]  Protime-INR     Status: Abnormal   Collection Time: 01/13/18  5:19 AM  Result Value Ref Range   Prothrombin Time 15.9 (H) 11.4 - 15.2 seconds   INR 1.28     Comment: Performed at [facility], [address] [place], [place] [address]  Prepare RBC     Status: None   Collection Time: 01/13/18  7:13 AM  Result Value Ref Range   Order Confirmation      ORDER PROCESSED BY BLOOD BANK Performed at [facility], [address] [place], [place] [address]   Glucose, capillary     Status: Abnormal   Collection Time: 01/13/18  7:44 AM  Result Value Ref Range   Glucose-Capillary 171 (H) 70 - 99 mg/dL   Comment 1 Notify RN   Glucose, capillary     Status: Abnormal   Collection Time: 01/13/18 12:02 PM  Result Value Ref Range   Glucose-Capillary 133 (H) 70 - 99 mg/dL   Comment 1 Notify RN   Surgical pathology     Status: None    Collection Time: 01/13/18  3:30 PM  Result Value Ref Range   SURGICAL PATHOLOGY      Surgical Pathology CASE: [id number] PATIENT: Gwyndolyn Saxon Gane Surgical Pathology Report     SPECIMEN SUBMITTED: A. Duodenum, r/o ischemia; cbx  CLINICAL HISTORY: Upper endoscopy August 2019 showed normal duodenal bulb, second portion of duodenum, and stomach  PRE-OPERATIVE DIAGNOSIS: Hematochezia, on coumadin, with acute blood loss anemia  POST-OPERATIVE DIAGNOSIS: Duodenal ulcers in bulb and 2nd portion, gastric ulcers, ulcer ascending colon     DIAGNOSIS: A.  DUODENUM; COLD BIOPSY: - ACUTE ENTERITIS WITH ISCHEMIC-TYPE FEATURES, IN 2 FRAGMENTS, SEE COMMENT. - GASTRIC HETEROTOPIA IN 1 FRAGMENT. - NEGATIVE FOR DYSPLASIA AND MALIGNANCY.  Comment: In one fragment there is acute mucosal necrosis with ulceration, and another fragment shows early necrosis of the villous tips. Both of these lesions are adjacent to normal-appearing mucosa. No medication particles, viral inclusions, or parasites are seen. The samples are  too superficial to reliably assess  for vasculitis.  Vascular insufficiency is a consideration, although it would be unusual to see the changes in a duodenal biopsy. Medication effect is a possibility, and careful review of new medications/supplements is recommended.   GROSS DESCRIPTION: A. Labeled: Cbx duodenum rule out ischemia Received: In formalin Tissue fragment(s): 3 Size: 0.1-0.3 cm Description: Tan-brown fragments Entirely submitted in one cassette.   Final Diagnosis performed by Bryan Lemma, MD.   Electronically signed 01/15/2018 10:06:50AM The electronic signature indicates that the named Attending Pathologist has evaluated the specimen  Technical component performed at [facility], [address], [place], [place] [address] Lab: [phone] Dir: Rush Farmer, MD, MMM  Professional component performed at [facility], [facility],  [address], [place], [place] [address] Lab: [phone] Dir: Dellia Nims. Rubinas, MD   Glucose, capillary     Status: Abnormal   Collection Time: 01/13/18  5:18 PM  Result Value Ref Range   Glucose-Capillary 112 (H) 70 - 99 mg/dL   Comment 1 Notify RN   Glucose, capillary     Status: Abnormal   Collection Time: 01/13/18  9:20 PM  Result Value Ref Range   Glucose-Capillary 167 (H) 70 - 99 mg/dL   Comment 1 Notify RN   Hemoglobin and hematocrit, blood     Status: Abnormal   Collection Time: 01/13/18 10:12 PM  Result Value Ref Range   Hemoglobin 8.4 (L) 13.0 - 17.0 g/dL   HCT 24.9 (L) 39.0 - 52.0 %    Comment: Performed at [facility], [address], [place], [place] [address]  H Pylori, IGM, IGG, IGA AB     Status: None   Collection Time: 01/13/18 10:12 PM  Result Value Ref Range   H. Pylogi, Iga Abs <9.0 0.0 - 8.9 units    Comment: (NOTE)                                Negative          <9.0                                Equivocal   9.0 - 11.0                                Positive         >11.0    H. Pylogi, Igm Abs <9.0 0.0 - 8.9 units    Comment: (NOTE)                                Negative          <9.0                                Equivocal   9.0 - 11.0                                Positive         >11.0 This test was developed and its performance characteristics determined by [facility]. It has not been cleared or approved by the Food and Drug Administration. Performed At: [facility]   [place], [place] [id number] Rush Farmer MD NO:[id number]    H Pylori IgG 0.43 0.00 - 0.79 Index Value    Comment: (NOTE)                             Negative           <0.80                             Equivocal    0.80 - 0.89                             Positive           >0.89   Protime-INR     Status: None   Collection Time: 01/14/18  5:28 AM  Result Value Ref Range   Prothrombin Time 15.1 11.4 - 15.2 seconds   INR 1.20     Comment:  Performed at [facility], [address] [place], [place] [address]  CBC     Status: Abnormal   Collection Time: 01/14/18  5:28 AM  Result Value Ref Range   WBC 8.3 4.0 - 10.5 K/uL   RBC 2.90 (L) 4.22 - 5.81 MIL/uL   Hemoglobin 8.5 (L) 13.0 - 17.0 g/dL   HCT 25.6 (L) 39.0 - 52.0 %   MCV 88.3 80.0 - 100.0 fL   MCH 29.3 26.0 - 34.0 pg   MCHC 33.2 30.0 - 36.0 g/dL   RDW 15.9 (H) 11.5 - 15.5 %   Platelets 152 150 - 400 K/uL   nRBC 0.7 (H) 0.0 - 0.2 %    Comment: Performed at [facility], [address] [place], [place] [address]  Basic metabolic panel     Status: Abnormal   Collection Time: 01/14/18  5:28 AM  Result Value Ref Range   Sodium 138 135 - 145 mmol/L   Potassium 3.8 3.5 - 5.1 mmol/L   Chloride 107 98 - 111 mmol/L   CO2 21 (L) 22 - 32 mmol/L   Glucose, Bld 152 (H) 70 - 99 mg/dL   BUN 8 8 - 23 mg/dL   Creatinine, Ser 1.01 0.61 - 1.24 mg/dL   Calcium 8.3 (L) 8.9 - 10.3 mg/dL   GFR calc non Af Amer >60 >60 mL/min   GFR calc Af Amer >60 >60 mL/min    Comment: (NOTE) The eGFR has been calculated using the CKD EPI equation. This calculation has not been validated in all clinical situations. eGFR's persistently <60 mL/min signify possible Chronic Kidney Disease.    Anion gap 10 5 - 15    Comment: Performed at [facility], [address] [place], [place] [address]  Glucose, capillary     Status: Abnormal   Collection Time: 01/14/18  7:23 AM  Result Value Ref Range   Glucose-Capillary 138 (H) 70 - 99 mg/dL   Comment 1 Notify RN   Glucose, capillary     Status: Abnormal   Collection Time: 01/14/18  9:16 AM  Result Value Ref Range   Glucose-Capillary 222 (H) 70 - 99 mg/dL   Comment 1 Notify RN   Glucose, capillary     Status: Abnormal   Collection Time: 01/14/18 12:13 PM  Result Value Ref Range   Glucose-Capillary 168 (H) 70 - 99 mg/dL   Comment 1 Notify RN  CBC     Status: Abnormal   Collection Time: 01/23/18 10:30 AM  Result Value  Ref Range   WBC 7.9 4.0 - 10.5 K/uL   RBC 3.22 (L) 4.22 - 5.81 MIL/uL   Hemoglobin 9.4 (L) 13.0 - 17.0 g/dL   HCT 29.9 (L) 39.0 - 52.0 %   MCV 92.9 80.0 - 100.0 fL   MCH 29.2 26.0 - 34.0 pg   MCHC 31.4 30.0 - 36.0 g/dL   RDW 14.9 11.5 - 15.5 %   Platelets 280 150 - 400 K/uL   nRBC 0.0 0.0 - 0.2 %    Comment: Performed at [facility], [address] [place], [place] [address]    Assessment/Plan:  Hyperlipidemia lipid control important in reducing the progression of atherosclerotic disease. Recommend statin therapy   CKD (chronic kidney disease), stage III Although CT angiogram could be used to evaluate his perfusion, given his chronic kidney disease I think starting with duplex would be more prudent.  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 Also, involved in wound healing. On appropriate medications.   Essential hypertension blood pressure control important in reducing the progression of atherosclerotic disease. On appropriate oral medications.   Duodenitis Patient has had ulcer disease and duodenitis.  There was a report in the biopsy suggesting ischemic duodenitis.  I think performing a mesenteric duplex on a patient with a litany of atherosclerotic risk factors would be a good idea.  This will be done at his convenience in the near future.  We will see him back following that study to discuss the results and determine further treatment options.      Leotis Pain 01/30/2018, 10:57 AM   This note was created with Dragon medical transcription system.  Any errors from dictation are unintentional.

## 2018-01-30 NOTE — Assessment & Plan Note (Signed)
Although CT angiogram could be used to evaluate his perfusion, given his chronic kidney disease I think starting with duplex would be more prudent.

## 2018-01-30 NOTE — Assessment & Plan Note (Addendum)
lipid control important in reducing the progression of atherosclerotic disease. Recommend statin therapy

## 2018-01-30 NOTE — Assessment & Plan Note (Signed)
blood glucose control important in reducing the progression of atherosclerotic disease. Also, involved in wound healing. On appropriate medications.  

## 2018-01-30 NOTE — Patient Instructions (Signed)
Chronic Mesenteric Ischemia Mesenteric ischemia is poor blood flow (circulation) in the vessels that supply blood to the stomach, intestines, and liver (mesenteric organs). Chronic mesenteric ischemia, also called mesenteric angina or intestinal angina, is a long-term (chronic) condition. It happens when an artery or vein that provides blood to the mesenteric organs gradually becomes blocked or narrow, restricting the blood supply to the organs. When the blood supply is severely restricted, the mesenteric organs cannot work properly. What are the causes? This condition is commonly caused by fatty deposits that build up in an artery (plaque), which can narrow the artery and restrict blood flow. Other causes include:  Weakened areas in blood vessel walls (aneurysms).  Conditions that cause twisting or inflammation of blood vessels, such as fibromuscular dysplasia or arteritis.  A disorder in which blood clots form in the veins (venous thrombosis).  Scarring and thickening (fibrosis) of blood vessels caused by radiation therapy.  A tear in the aorta, the body's main artery (aortic dissection).  Blood vessel problems after illegal drug use, such as use of cocaine.  Tumors in the nervous system (neurofibromatosis).  Certain autoimmune diseases, such as lupus.  What increases the risk? The following factors may make you more likely to develop this condition:  Being male.  Being over age 50, especially if you have a history of heart problems.  Smoking.  Congestive heart failure.  Irregular heartbeat (arrhythmia).  Having a history of heart attack or stroke.  Diabetes.  High cholesterol.  High blood pressure (hypertension).  Being overweight or obese.  Kidney disease (renal disease) requiring dialysis.  What are the signs or symptoms? Symptoms of this condition include:  Abdomen (abdominal) pain or cramps that develop 15-60 minutes after a meal. This pain may last for 1-3  hours. Some people may develop a fear of eating because of this symptom.  Weight loss.  Diarrhea.  Bloody stool.  Nausea.  Vomiting.  Bloating.  Abdominal pain after stress or with exercise.  How is this diagnosed? This condition is diagnosed based on:  Your medical history.  A physical exam.  Tests, such as: ? Ultrasound. ? CT scan. ? Blood tests. ? Urine tests. ? An imaging test that involves injecting a dye into your arteries to show blood flow through blood vessels (angiogram). This can help to show if there are any blockages in the vessels that lead to the intestines. ? Passing a small probe through the mouth and into the stomach to measure the output of carbon dioxide (gastric tonometry). This can help to indicate whether there is decreased blood flow to the stomach and intestines.  How is this treated? This condition may be treated with:  Dietary changes such as eating smaller, low-fat, meals more frequently.  Lifestyle changes to treat underlying conditions that contribute to the disease, such as high cholesterol and high blood pressure.  Medicines to reduce blood clotting and increase blood flow.  Surgery to remove the blockage, repair arteries or veins, and restore blood flow. This may involve: ? Angioplasty. This is surgery to widen the affected artery, reduce the blockage, and sometimes insert a small, mesh tube (stent). ? Bypass surgery. This may be done to go around (bypass) the blockage and reconnect healthy arteries or veins. ? Placing a stent in the affected area. This may be done to help keep blocked arteries open.  Follow these instructions at home: Eating and drinking  Eat a heart-healthy diet. This includes fresh fruits and vegetables, whole grains, and lean proteins   like chicken, fish, eggs, and beans.  Avoid foods that contain a lot of: ? Salt (sodium). ? Sugar. ? Saturated fat (such as red meat). ? Trans fat (such as fried foods).  Stay  hydrated. Drink enough fluid to keep your urine clear or pale yellow. Lifestyle  Stay active and get regular exercise as told by your health care provider. Aim for 150 minutes of moderate activity or 75 minutes of vigorous activity a week. Ask your health care provider what activities and forms of exercise are safe for you.  Maintain a healthy weight.  Work with your health care provider to manage your cholesterol.  Manage any other health problems you have, such as high blood pressure, diabetes, or heart rhythm problems.  Do not use any products that contain nicotine or tobacco, such as cigarettes and e-cigarettes. If you need help quitting, ask your health care provider. General instructions  Take over-the-counter and prescription medicines only as told by your health care provider.  Keep all follow-up visits as told by your health care provider. This is important. Contact a health care provider if:  Your symptoms do not improve or they return after treatment.  You have a fever. Get help right away if:  You have severe abdominal pain.  You have severe chest pain.  You have shortness of breath.  You feel weak or dizzy.  You have palpitations.  You have numbness or weakness in your face, arm, or leg.  You are confused.  You have trouble speaking or people have trouble understanding what you are saying.  You are constipated.  You have trouble urinating.  You have blood in your stool.  You have severe nausea, vomiting, or persistent diarrhea. Summary  Mesenteric ischemia is poor circulation in the vessels that supply blood to the the stomach, intestines, and liver (mesenteric organs).  This condition happens when an artery or vein that provides blood to the mesenteric organs gradually becomes blocked or narrow, restricting the blood supply to the organs.  This condition is commonly caused by fatty deposits that build up in an artery (plaque), which can narrow the  artery and restrict blood flow.  You are more likely to develop this condition if you are over age 50 and have a history of heart problems, high blood pressure, diabetes, or high cholesterol.  This condition is usually treated with medicines, dietary and lifestyle changes, and surgery to remove the blockage, repair arteries or veins, and restore blood flow. This information is not intended to replace advice given to you by your health care provider. Make sure you discuss any questions you have with your health care provider. Document Released: 10/22/2010 Document Revised: 02/17/2016 Document Reviewed: 02/17/2016 Elsevier Interactive Patient Education  2017 [employer]  

## 2018-01-30 NOTE — Assessment & Plan Note (Signed)
Patient has had ulcer disease and duodenitis.  There was a report in the biopsy suggesting ischemic duodenitis.  I think performing a mesenteric duplex on a patient with a litany of atherosclerotic risk factors would be a good idea.  This will be done at his convenience in the near future.  We will see him back following that study to discuss the results and determine further treatment options.

## 2018-02-03 ENCOUNTER — Ambulatory Visit (INDEPENDENT_AMBULATORY_CARE_PROVIDER_SITE_OTHER): Payer: Medicare HMO | Admitting: Family Medicine

## 2018-02-03 ENCOUNTER — Encounter: Payer: Self-pay | Admitting: Family Medicine

## 2018-02-03 VITALS — BP 134/80 | HR 65 | Resp 16 | Ht 64.0 in | Wt 198.0 lb

## 2018-02-03 DIAGNOSIS — E1121 Type 2 diabetes mellitus with diabetic nephropathy: Secondary | ICD-10-CM

## 2018-02-03 DIAGNOSIS — K2980 Duodenitis without bleeding: Secondary | ICD-10-CM | POA: Diagnosis not present

## 2018-02-03 DIAGNOSIS — IMO0002 Reserved for concepts with insufficient information to code with codable children: Secondary | ICD-10-CM

## 2018-02-03 DIAGNOSIS — E1165 Type 2 diabetes mellitus with hyperglycemia: Secondary | ICD-10-CM | POA: Diagnosis not present

## 2018-02-03 MED ORDER — INSULIN DETEMIR 100 UNIT/ML FLEXPEN: 60.0000 [IU] | PEN_INJECTOR | Freq: Every day | SUBCUTANEOUS | 3 refills | Status: DC

## 2018-02-03 NOTE — Progress Notes (Signed)
Subjective:    Patient ID: Lucas Caldwell, male    DOB: 1933/11/04, 84 y.o.   MRN: [id number]  Lucas Caldwell is a 84 y.o. male presenting on 02/03/2018 for Hospitalization Follow-up and Diabetes   HPI   Hospital Follow-up / Duodenitis / GERD / GIB Admit 01/11/18 Discharge 01/14/18 Admitted for hematochezia secondary to duodenal ulcers, provoked by anticoagulation, on coumadin, ultimately he was treated with 3u PRBC transfusion, FFP Vit K and PPI. He had upper endoscopy and colonoscopy on 10/29, showed multiple ulcers, had single bleeding ulcer, treated. Ultimately he was discharged, held coumadin and started on aspirin 52 - Reviewed notes from recent specialists [facility] - Dr Lucky Cowboy 01/30/18, has [place] tomorrow [facility] Hematology - Dr Janene Madeira 01/22/18 [facility] Cardiology - Dr End 01/23/18 [facility] [facility] - Dr Marius Ditch 01/23/18  Currently without further bleeding, next plan to see Vascular.  CHRONIC DM, Type 2/ CKD-III Nephropathyand Neuropathy A1c was previously still elevated >9-10, not due yet for repeat T2I Meds: - Trulicity 0.75mg  weekly inj Saturday (now has supply of med after app for med assistance) - He has STOPPED levemir due to starting Trulicity, misunderstood instructions with new med Reports good compliance. Tolerating well w/o side-effects Currently on ACEi Lifestyle: - Diet (still trying to improve diet, but not always adhering to low carb diet, drinks some water) - Exercise (limited by dyspnea) - Admits chronicDM Neuropathy with pain in feet bilateral,on Gabapentin with improvement, taking 300mg  x 2 pills twice a day - Denies hypoglycemia, nausea vomiting, fever chills, swelling, polyuria   Depression screen [facility] 2/9 12/31/2017 11/18/2017 10/24/2017  Decreased Interest 0 0 0  Down, Depressed, Hopeless 0 0 0  PHQ - 2 Score 0 0 0    Social History   Tobacco Use  . Smoking status: Former Smoker    Packs/day: 2.00    Years: 15.00    Pack years: 30.00    Types:  Cigarettes  . Smokeless tobacco: Former Systems developer  . Tobacco comment: quit 1979  Substance Use Topics  . Alcohol use: No    Comment: previously drank heavily - quit 1979.  . Drug use: No    Review of Systems Per HPI unless specifically indicated above     Objective:    BP 134/80 (BP Location: Left Arm, Patient Position: Sitting, Cuff Size: Normal)   Pulse 65   Resp 16   Ht 5\' 4"  (1.626 m)   Wt 198 lb (89.8 kg)   BMI 33.99 kg/m   Wt Readings from Last 3 Encounters:  02/03/18 198 lb (89.8 kg)  01/30/18 193 lb (87.5 kg)  01/23/18 195 lb 6.4 oz (88.6 kg)    Physical Exam  Constitutional: [name] oriented to person, place, and time. He appears well-developed and well-nourished. No distress.  Elderly appearing 84 year old male, comfortable, cooperative  HENT:  Head: Normocephalic and atraumatic.  Mouth/Throat: Oropharynx is clear and moist.  Cardiovascular: Normal rate, regular rhythm, normal heart sounds and intact distal pulses.  No murmur heard. Pulmonary/Chest: Effort normal and breath sounds normal. No respiratory distress. He has no wheezes. He has no rales.  Abdominal: Soft. Bowel sounds are normal. He exhibits no distension. There is no tenderness.  Musculoskeletal: He exhibits no edema.  Neurological: [name] alert and oriented to person, place, and time.  Skin: Skin is warm and dry. No rash noted. [name] not diaphoretic. No erythema.  Psychiatric: He has a normal mood and affect. His behavior is normal.  Well  groomed, good eye contact, normal speech and thoughts  Nursing note and vitals reviewed.  Results for orders placed or performed during the hospital encounter of 01/23/18  CBC  Result Value Ref Range   WBC 7.9 4.0 - 10.5 K/uL   RBC 3.22 (L) 4.22 - 5.81 MIL/uL   Hemoglobin 9.4 (L) 13.0 - 17.0 g/dL   HCT 29.9 (L) 39.0 - 52.0 %   MCV 92.9 80.0 - 100.0 fL   MCH 29.2 26.0 - 34.0 pg   MCHC 31.4 30.0 - 36.0 g/dL   RDW 14.9 11.5 - 15.5 %   Platelets 280 150 - 400 K/uL    nRBC 0.0 0.0 - 0.2 %      Assessment & Plan:   Problem List Items Addressed This Visit    Duodenitis S/p GIB Secondary to anticoagulation Improved now w/o active bleeding, improved recent labs Hgb up to 9.4 Following with multiple specialists currently Cards, GI, Vascular and Hematology    Uncontrolled type 2 diabetes mellitus with nephropathy ([place]) - Primary    Uncontrolled DM still with elevated A1c, not due for repeat With Hyperglycemia by report, patient has stopped Insulin after new GLP1 incorrectly Complications - hypoglycemia (improved), CKD-III, peripheral neuropathy, cataracts, CAD  Plan:  1. Discussed DM management concern with CKD and risk of hypoglycemia - limited med options due to CKD - CONTINUE Trulicity 0.75mg  weekly - RESTART insulin - [name] 60u daily in AM, - new rx sent, may try splitting dose again in future if need - Needs to bring CBG readings to office to better titrate insulin, otherwise may recommend future referral to Endocrine for additional monitoring - Counseling on risk of hypoglycemia and symptoms 2. Encourage improved lifestyle - keep improving diet - limit carbs, limit sweet tea, inc water - he has poor health literacy on diabetes Follow-up 3 months for DM A1c, med adjust  If still uncontrolled and limitation improvement GLP will plan to refer him to [facility] Endocrinology for further DM management with insulin titration for patient with CKD      Relevant Medications   Insulin Detemir (LEVEMIR FLEXTOUCH) 100 UNIT/ML Pen      Meds ordered this encounter  Medications  . Insulin Detemir (LEVEMIR FLEXTOUCH) 100 UNIT/ML Pen    Sig: Inject 60 Units into the skin daily.    Dispense:  5 pen    Refill:  3      Follow up plan: Return in about 3 months (around 05/06/2018) for DM A1c, med adjust.  Lucas Putnam, DO [facility] 02/03/2018, 1:29 PM

## 2018-02-03 NOTE — Patient Instructions (Addendum)
Thank you for coming to the office today.  Restart Levemir insulin 60 units every morning.  Keep checking sugars, if fasting sugar in morning is consistently < 150 - may call office to get advice and give [place] readings.  Continue Trulicity 0.75mg  injection once a week on Saturdays.  Please schedule a Follow-up Appointment to: Return in about 3 months (around 05/06/2018) for DM A1c, med adjust.  If you have any other questions or concerns, please feel free to call the office or send a message through [place]. You may also schedule an earlier appointment if necessary.  Additionally, you may be receiving a survey about your experience at our office within a few days to 1 week by e-mail or mail. We value your feedback.  [name], DO [place]

## 2018-02-03 NOTE — Telephone Encounter (Signed)
Spoke with pt and wife will come today and pick up linzess 145 mg samples to help with constipation

## 2018-02-03 NOTE — Assessment & Plan Note (Signed)
Uncontrolled DM still with elevated A1c, not due for repeat With Hyperglycemia by report, patient has stopped Insulin after new GLP1 incorrectly Complications - hypoglycemia (improved), CKD-III, peripheral neuropathy, cataracts, CAD  Plan:  1. Discussed DM management concern with CKD and risk of hypoglycemia - limited med options due to CKD - CONTINUE Trulicity 0.75mg  weekly - RESTART insulin - [name] 60u daily in AM, - new rx sent, may try splitting dose again in future if need - Needs to bring CBG readings to office to better titrate insulin, otherwise may recommend future referral to Endocrine for additional monitoring - Counseling on risk of hypoglycemia and symptoms 2. Encourage improved lifestyle - keep improving diet - limit carbs, limit sweet tea, inc water - he has poor health literacy on diabetes Follow-up 3 months for DM A1c, med adjust  If still uncontrolled and limitation improvement GLP will plan to refer him to [facility] Endocrinology for further DM management with insulin titration for patient with CKD

## 2018-02-04 ENCOUNTER — Ambulatory Visit (INDEPENDENT_AMBULATORY_CARE_PROVIDER_SITE_OTHER): Payer: Medicare HMO | Admitting: Nurse Practitioner

## 2018-02-04 ENCOUNTER — Encounter (INDEPENDENT_AMBULATORY_CARE_PROVIDER_SITE_OTHER): Payer: Self-pay | Admitting: Nurse Practitioner

## 2018-02-04 ENCOUNTER — Telehealth: Payer: Self-pay | Admitting: Gastroenterology

## 2018-02-04 ENCOUNTER — Ambulatory Visit (INDEPENDENT_AMBULATORY_CARE_PROVIDER_SITE_OTHER): Payer: Medicare HMO

## 2018-02-04 VITALS — BP 153/78 | HR 72 | Resp 16 | Ht 66.0 in | Wt 193.6 lb

## 2018-02-04 DIAGNOSIS — K2980 Duodenitis without bleeding: Secondary | ICD-10-CM

## 2018-02-04 DIAGNOSIS — Z87891 Personal history of nicotine dependence: Secondary | ICD-10-CM | POA: Diagnosis not present

## 2018-02-04 DIAGNOSIS — J432 Centrilobular emphysema: Secondary | ICD-10-CM

## 2018-02-04 DIAGNOSIS — I10 Essential (primary) hypertension: Secondary | ICD-10-CM | POA: Diagnosis not present

## 2018-02-04 NOTE — Telephone Encounter (Signed)
Pt has been scheduled for 02/05/18 for trouble swallowing

## 2018-02-04 NOTE — Progress Notes (Signed)
Subjective:    Patient ID: DEVARIUS NELLES, male    DOB: 1933/04/20, 84 y.o.   MRN: [id number] Chief Complaint  Patient presents with  . Follow-up    ultrasound follow up    HPI  LUCAH PETTA is a 84 y.o. male that is referred to as to evaluate for possible ischemic duodenitis.  The patient recently was hospitalized with an acute GI bleed.  During the course of the hospital stay a biopsy was performed that suggested that there may be a component of ischemic duodenitis.  The patient endorses issues with swallowing, however he has a Schatzki's ring.  He also endorses having some reflux symptoms following eating.  He denies any food fear, pain following eating, or unintentional weight loss.    He denies any nausea vomiting, diarrhea, rectal bleeding.  Today he underwent a mesenteric duplex which revealed normal celiac artery, superior mesenteric artery, inferior mesenteric artery, splenic artery and hepatic artery findings  Past Medical History:  Diagnosis Date  . Arthritis    "probably in his legs" (08/15/2016)  . Blind right eye   . BPH (benign prostatic hyperplasia)   . Breast asymmetry    Left breast is larger, present for several years.  [name] CAD (coronary artery disease)    a. Cath in the late 90's - reportedly ok;  b. 2014 s/p stenting x 2 @ [facility]; c 08/19/16 Cath/PCI with DES -> RCA, plan to treat LM 70% medically. Seen by surgery and felt to be too high risk for CABG.  . Chronic combined systolic (congestive) and diastolic (congestive) heart failure ([place])    a. Previously reduced EF-->50% by echo in 2012;  b. 06/2015 Echo: EF 50-55%  c. 07/2016 Echo: EF 45-50%.  . Chronic kidney disease (CKD), stage IV (severe) ([place])   . CML (chronic myelocytic leukemia) ([place])   . GERD (gastroesophageal reflux disease)   . Gout   . Hyperlipemia   . Hypertension   . Hypothyroidism   . Iron deficiency anemia   . Migraine    "in the 1960s" (08/15/2016)  . NICM (nonischemic cardiomyopathy)  ([place])    a. Previously reduced EF-->50% by echo in 2012;  b. 06/2015 Echo: EF 50-55%, Gr2 DD, mild MR, mildly dil LA, Ao sclerosis, mild TR;  c. 07/2016 Echo: EF 45-50%, ? inf HK, mild MR, mildly dil LA.  [name] PAF (paroxysmal atrial fibrillation) ([facility])    a. ? Dx 2014-->s/p DCCV;  b. CHA2DS2VASc = 6-->chronic coumadin.  . Prostate cancer ([place])   . RBBB   . Type II diabetes mellitus ([place])     Past Surgical History:  Procedure Laterality Date  . CATARACT EXTRACTION W/ INTRAOCULAR LENS  IMPLANT, BILATERAL Bilateral   . COLONOSCOPY N/A 01/13/2018   Procedure: COLONOSCOPY;  Surgeon: Lin Landsman, MD;  Location[facility] ENDOSCOPY;  Service: Gastroenterology;  Laterality: N/A;  . COLONOSCOPY WITH PROPOFOL N/A 01/01/2018   Procedure: COLONOSCOPY WITH PROPOFOL;  Surgeon: Lin Landsman, MD;  Location: [facility] ENDOSCOPY;  Service: Gastroenterology;  Laterality: N/A;  . CORONARY ANGIOPLASTY WITH STENT PLACEMENT  09/2012   2 stents  . CORONARY STENT INTERVENTION N/A 08/19/2016   Procedure: Coronary Stent Intervention;  Surgeon: [name], MD;  Location: [place] CV LAB;  Service: Cardiovascular;  Laterality: N/A;  . ESOPHAGEAL MANOMETRY N/A 12/16/2017   Procedure: ESOPHAGEAL MANOMETRY (EM);  Surgeon: Lin Landsman, MD;  Location: [facility] ENDOSCOPY;  Service: Gastroenterology;  Laterality: N/A;  . ESOPHAGOGASTRODUODENOSCOPY N/A 12/20/2016   Procedure:  ESOPHAGOGASTRODUODENOSCOPY (EGD);  Surgeon: Lin Landsman, MD;  Location: [facility] ENDOSCOPY;  Service: Gastroenterology;  Laterality: N/A;  . ESOPHAGOGASTRODUODENOSCOPY N/A 01/13/2018   Procedure: ESOPHAGOGASTRODUODENOSCOPY (EGD);  Surgeon: Lin Landsman, MD;  Location: [facility] ENDOSCOPY;  Service: Gastroenterology;  Laterality: N/A;  . ESOPHAGOGASTRODUODENOSCOPY (EGD) WITH PROPOFOL N/A 11/03/2017   Procedure: ESOPHAGOGASTRODUODENOSCOPY (EGD) WITH PROPOFOL;  Surgeon: Lin Landsman, MD;  Location: [facility] ENDOSCOPY;  Service:  Gastroenterology;  Laterality: N/A;  . EYE SURGERY    . HERNIA REPAIR     "navel"  . LEFT HEART CATH AND CORONARY ANGIOGRAPHY N/A 08/14/2016   Procedure: Left Heart Cath and Coronary Angiography;  Surgeon: [name], MD;  Location: [place] CV LAB;  Service: Cardiovascular;  Laterality: N/A;  . TOE AMPUTATION Right    "big toe"    Social History   Socioeconomic History  . Marital status: Married    Spouse name: Not on file  . Number of children: Not on file  . Years of education: Not on file  . Highest education level: 7th grade  Occupational History  . Occupation: Retired    Comment: Owned his own [place].  Social Needs  . Financial resource strain: Hard  . Food insecurity:    Worry: Sometimes true    Inability: Never true  . Transportation needs:    Medical: No    Non-medical: No  Tobacco Use  . Smoking status: Former Smoker    Packs/day: 2.00    Years: 15.00    Pack years: 30.00    Types: Cigarettes  . Smokeless tobacco: Former Systems developer  . Tobacco comment: quit 1979  Substance and Sexual Activity  . Alcohol use: No    Comment: previously drank heavily - quit 1979.  . Drug use: No  . Sexual activity: Never  Lifestyle  . Physical activity:    Days per week: 0 days    Minutes per session: 0 min  . Stress: Not at all  Relationships  . Social connections:    Talks on phone: More than three times a week    Gets together: More than three times a week    Attends religious service: More than 4 times per year    Active member of club or organization: No    Attends meetings of clubs or organizations: Never    Relationship status: Married  . Intimate partner violence:    Fear of current or ex partner: No    Emotionally abused: No    Physically abused: No    Forced sexual activity: No  Other Topics Concern  . Not on file  Social History Narrative   Working on transportation truck part time     Family History  Problem Relation Age of Onset  .  Brain cancer Father   . Diabetes Sister   . Heart attack Sister     Allergies  Allergen Reactions  . Clopidogrel Anaphylaxis    Other reaction(s): ANAPHYLAXIS;altered mental status;  (electrolytes "out of wack" and confusion per family; THEY DO NOT REMEMBER ANY OTHER REACTION)- MSB 10/10/15  . Ciprofloxacin Itching  . Mirtazapine Diarrhea    Bad dreams Bad dreams  . Spironolactone     Other reaction(s): Other (See Comments) gynecomastia     Review of Systems   Review of Systems: Negative Unless Checked Constitutional: [] Weight loss  [] Fever  [] Chills Cardiac: [] Chest pain   []  Atrial Fibrillation  [] Palpitations   [] Shortness of breath when laying flat   [] Shortness of breath with  exertion. Vascular:  [] Pain in legs with walking   [] Pain in legs with standing  [] History of DVT   [] Phlebitis   [] Swelling in legs   [] Varicose veins   [] Non-healing ulcers Pulmonary:   [] Uses home oxygen   [] Productive cough   [] Hemoptysis   [] Wheeze  [x] COPD   [] Asthma Neurologic:  [] Dizziness   [] Seizures   [] History of stroke   [] History of TIA  [] Aphasia   [] Vissual changes   [] Weakness or numbness in arm   [] Weakness or numbness in leg Musculoskeletal:   [] Joint swelling   [] Joint pain   [] Low back pain  []  History of Knee Replacement Hematologic:  [] Easy bruising  [] Easy bleeding   [] Hypercoagulable state   [] Anemic Gastrointestinal:  [] Diarrhea   [] Vomiting  [x] Gastroesophageal reflux/heartburn   [x] Difficulty swallowing. Genitourinary:  [x] Chronic kidney disease   [] Difficult urination  [] Anuric   [] Blood in urine Skin:  [] Rashes   [] Ulcers  Psychological:  [] History of anxiety   []  History of major depression  []  Memory Difficulties     Objective:   Physical Exam  BP (!) 153/78 (BP Location: Right Arm)   Pulse 72   Resp 16   Ht 5\' 6"  (1.676 m)   Wt 193 lb 9.6 oz (87.8 kg)   BMI 31.25 kg/m   Gen: WD/WN, NAD Head: [place]/AT, No temporalis wasting.  Ear/Nose/Throat: Hearing grossly  intact, nares w/o erythema or drainage Eyes: PER, EOMI, sclera nonicteric.  Neck: Supple, no masses.  No JVD.  Pulmonary:  Good air movement, no use of accessory muscles.  Cardiac: RRR Vascular:  Vessel Right Left  Radial Palpable Palpable  Gastrointestinal: soft, non-distended. No guarding/no peritoneal signs.  Normal bowel sounds Musculoskeletal: M/S 5/5 throughout.  No deformity or atrophy.  Neurologic: Pain and light touch intact in extremities.  Symmetrical.  Speech is fluent. Motor exam as listed above. Psychiatric: Judgment intact, Mood & affect appropriate for pt's clinical situation. Dermatologic: No Venous rashes. No Ulcers Noted.  No changes consistent with cellulitis. Lymph : No Cervical lymphadenopathy, no lichenification or skin changes of chronic lymphedema.      Assessment & Plan:   1. Duodenitis The patient had no evidence of an ischemic mesenteric system.  The celiac superior mesenteric and inferior mesenteric splenic and hepatic arteries had normal findings.  Advised the patient and his wife that they should continue to follow-up with GI for continued management of his duodenitis and other GI issues.  Patient advised to follow-up with [place] as needed.  2. Essential hypertension Continue antihypertensive medications as already ordered, these medications have been reviewed and there are no changes at this time.   3. Centrilobular emphysema ([place]) Continue pulmonary medications and aerosols as already ordered, these medications have been reviewed and there are no changes at this time.     Current Outpatient Medications on File Prior to Visit  Medication Sig Dispense Refill  . acetaminophen (TYLENOL) 500 MG tablet Take 500 mg by mouth every 6 (six) hours as needed.    [name] albuterol (PROVENTIL HFA;VENTOLIN HFA) 108 (90 Base) MCG/ACT inhaler Inhale 2 puffs into the lungs every 6 (six) hours as needed for wheezing or shortness of breath. 1 Inhaler 2  . Alcohol Swabs 70 %  PADS Use when checking blood sugar up to 3x daily 100 each 11  . allopurinol (ZYLOPRIM) 100 MG tablet Take 100 mg by mouth daily.  12  . aspirin EC 81 MG tablet Take 1 tablet (81 mg total) by mouth daily.  90 tablet 3  . atorvastatin (LIPITOR) 80 MG tablet Take 1 tablet (80 mg total) by mouth daily. 90 tablet 1  . bosutinib (BOSULIF) 100 MG tablet Take 200 mg by mouth at bedtime. Take with food.     . Dulaglutide (TRULICITY) 6.81 LX/7.2IO SOPN Inject 0.75 mg into the skin once a week. 4 pen 0  . fluticasone furoate-vilanterol (BREO ELLIPTA) 100-25 MCG/INH AEPB Inhale 1 puff into the lungs daily. 28 each 5  . gabapentin (NEURONTIN) 300 MG capsule Take 2 capsules in morning, 1 capsule in afternoon, 2 capsules at bedtime. In future increase up to max of 3 capsules 3 times daily 180 capsule 0  . Insulin Detemir (LEVEMIR FLEXTOUCH) 100 UNIT/ML Pen Inject 60 Units into the skin daily. 5 pen 3  . Insulin Pen Needle 31G X 5 MM MISC 31 g by Does not apply route as directed. 100 each 5  . levothyroxine (SYNTHROID, LEVOTHROID) 50 MCG tablet Take 25 mcg by mouth daily before breakfast.     . loperamide (IMODIUM A-D) 2 MG tablet Take 2 mg by mouth 3 (three) times daily as needed for diarrhea or loose stools.     . Multiple Vitamins-Minerals (MULTIVITAMIN ADULTS 50+ PO) Take 1 tablet by mouth every morning.    . nepafenac (NEVANAC) 0.1 % ophthalmic suspension Place 1 drop into both eyes 3 (three) times daily as needed.    . nitroGLYCERIN (NITROSTAT) 0.4 MG SL tablet Place 1 tablet (0.4 mg total) under the tongue every 5 (five) minutes x 3 doses as needed for chest pain. 75 tablet 2  . omeprazole (PRILOSEC) 40 MG capsule Take 1 capsule (40 mg total) by mouth 2 (two) times daily. 60 capsule 0  . Spacer/Aero-Holding Chambers (OPTICHAMBER ADVANTAGE) MISC 1 each by Other route once. 1 each 0  . torsemide (DEMADEX) 20 MG tablet Take 20 mg by mouth daily.     . enalapril (VASOTEC) 5 MG tablet Take 1 tablet (5 mg total)  by mouth daily. (Patient not taking: Reported on 01/30/2018) 90 tablet 3  . metoprolol succinate (TOPROL-XL) 50 MG 24 hr tablet Take 1 tablet (50 mg total) by mouth every morning. (Patient not taking: Reported on 01/30/2018) 90 tablet 3  . ranitidine (ZANTAC) 300 MG tablet Take 1 tablet (300 mg total) by mouth daily. (Patient not taking: Reported on 02/03/2018) 90 tablet 3   No current facility-administered medications on file prior to visit.     There are no Patient Instructions on file for this visit. Return if symptoms worsen or fail to improve.   Kris Hartmann, NP  This note was completed with Sales executive.  Any errors are purely unintentional.

## 2018-02-04 NOTE — Telephone Encounter (Signed)
Patients wife states pt was just seen by another type of doctor who wants pt to be seen right away by Dr.Vanga due to still having trouble swallowing. Patients wife wants to know something asap on what to do for patient. Please advise.

## 2018-02-05 ENCOUNTER — Ambulatory Visit (INDEPENDENT_AMBULATORY_CARE_PROVIDER_SITE_OTHER): Payer: Medicare HMO | Admitting: Gastroenterology

## 2018-02-05 ENCOUNTER — Ambulatory Visit: Payer: Medicare HMO | Admitting: Gastroenterology

## 2018-02-05 ENCOUNTER — Other Ambulatory Visit: Payer: Self-pay | Admitting: Family Medicine

## 2018-02-05 ENCOUNTER — Encounter: Payer: Self-pay | Admitting: Gastroenterology

## 2018-02-05 VITALS — BP 149/78 | HR 91 | Ht 66.0 in | Wt 196.4 lb

## 2018-02-05 DIAGNOSIS — K219 Gastro-esophageal reflux disease without esophagitis: Secondary | ICD-10-CM | POA: Diagnosis not present

## 2018-02-05 DIAGNOSIS — R1310 Dysphagia, unspecified: Secondary | ICD-10-CM | POA: Diagnosis not present

## 2018-02-05 DIAGNOSIS — R1319 Other dysphagia: Secondary | ICD-10-CM

## 2018-02-05 MED ORDER — ALLOPURINOL 100 MG PO TABS: 100.0000 mg | ORAL_TABLET | Freq: Every day | ORAL | 12 refills | Status: DC

## 2018-02-05 MED ORDER — OMEPRAZOLE 40 MG PO CPDR: 40.0000 mg | DELAYED_RELEASE_CAPSULE | Freq: Two times a day (BID) | ORAL | 0 refills | Status: DC

## 2018-02-05 NOTE — Progress Notes (Signed)
[name], MD [address]  [place]  [place], [place] [address]  Main: [phone]  Fax: [phone]    Gastroenterology Consultation  Referring Provider:     [name] * Primary Care Physician:  [name], DO Primary Gastroenterologist:  Dr. [name] Reason for Consultation: Hospital follow-up, ischemic duodenitis        HPI:   Lucas Caldwell is a 84 y.o. male referred by Dr. Parks Ranger, [name], DO  for consultation & management of dysphagia for 3 months.  [name] here for hospital follow-up.  He has multiple comorbidities as listed below, significant for coronary artery disease, congestive heart failure, atrial fibrillation on anticoagulation with brilinta and Coumadin, CML, recent hospitalization on 12/19/2016 with AKI and 3 months history of intermittent dysphagia to solid food. He reports that eating chicken or beef or hard foods which are difficult to chew result in food stuck in upper chest. He denies trouble swallowing liquids or any other foods. Denies losing any weight. Denies abdominal pain, nausea, vomiting, heartburn or regurgitation. He denies having an upper endoscopy or a colonoscopy in the past. He had a barium esophagram today which did not reveal any obvious stricture or a space-occupying lesion or extrinsic compression. There was a possibility of esophageal spasm due to evidence of tertiary contractions of the distal third of the esophagus. [name] also evaluated by speech pathology and there was no evidence of oropharyngeal dysphagia. [name] on Pepcid as outpatient. He denies having any food impaction.  GI was consulted at that time, I performed upper endoscopy which revealed partially obstructing moderate size Schatzki's ring, I did not perform dilation as patient was on both Coumadin and Brilinta.  [name] here for hospital follow-up today.  He reports that since discharge he has been taking ranitidine 300 mg in the  morning and Protonix 40 mg before dinner.  He denies any heartburn but continues to have dysphagia to solid foods especially hard foods like chicken, beef, and ham.  He acknowledged that he tries to eat fast without chewing properly. [name] accompanied by his wife today.  He otherwise denies any complaints today and reports doing well.  [name] not smoking  Follow-up visit 12/01/2017 Patient underwent EGD, found to have widely patent, nonobstructing Schatzki's ring, which was dilated to 61mm as patient has symptoms of dysphagia. He reports that he felt better for 2 days after the procedure and symptoms recurred. He reports dysphagia to both solids and liquids, get stuck in his throat and associated with regurgitation.   Follow-up visit 12/18/2017 Patient underwent esophageal manometry this week.  Awaiting results.  He continues to have sensation of food stuck in his throat associated with regurgitation, early satiety, bloating, gas.  He also reports intermittent nonbloody diarrhea and takes Imodium as needed.  He took Imodium yesterday and did not have bowel movement since then.  Patient appears to be frustrated with ongoing upper GI symptoms.  [name] taking omeprazole 40 mg once a day that was started by me.  However, his symptoms are persistent.  [name] also taking ranitidine 300 mg daily.  He reports that this is working better compared to omeprazole.  Despite these symptoms, patient's weight has been stable in the last 1 year.  He said he stopped eating chicken from [name] and hard meats because of these symptoms.  His son accompanied him today in order to help understand his condition.  [name] reports that he  drinks sweetened tea, coffee with artificial sweeteners regularly.  He does drink soda intermittently. 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  Awaiting final interpretation.  He also underwent screening colonoscopy and was found to have nonbleeding AVM in the ascending colon which was treated with APC.  10 days after this procedure, patient was hospitalized at [facility] on 01/11/2018 secondary to acute blood loss anemia from painless hematochezia.  Patient denies postprandial pain. upper endoscopy revealed antral and duodenal ulcers, pathology consistent with acute ischemic duodenitis.  His aspirin 81 mg and Coumadin were held during the hospital stay.  Hematochezia resolved and Aspirin 81 mg was restarted at the time of discharge.  Patient denies any further episodes of hematochezia since discharge.  He reports having good energy levels, denies shortness of breath, chest pain or lightheadedness.  [name] seen by his cardiologist Dr. Saunders Revel today prior to this visit.  Patient has been taking omeprazole 40 mg twice daily.  He denies having diarrhea anymore, reports that his stools are brown  Follow-up visit 02/04/2018 Patient is seen by vascular, Dr. Lucky Cowboy for ischemic duodenitis.  There was no evidence of celiac artery stenosis.  Patient reports that he had one episode of severe chest discomfort, sensation of food stuck in his chest associated with regurgitation.  He also had episodes of nonbloody diarrhea, and it resolved.  He reports having a good day today.  Denies heartburn, regurgitation.  He continues to take aspirin 81, omeprazole 40 mg twice daily.  [name] working towards getting his diabetes under control  GI Procedures:  Colonoscopy in 2001 at [facility], reportedly normal  Colonoscopy 01/01/2018 - A single non-bleeding colonic angioectasia. Treated with argon plasma coagulation (APC). - Diverticulosis in the sigmoid colon and in the descending colon. - The distal rectum and anal verge are normal on retroflexion view. - The examination was otherwise normal. - No specimens collected.  EGD 01/13/2018 - The examined  portion of the jejunum was normal. - Multiple non-bleeding duodenal ulcers with a clean ulcer base (Forrest Class III) Biopsied , likely source of Bleeding. - Non-bleeding gastric ulcers with a clean ulcer base (Forrest Class III). - Non-obstructing Schatzki ring. - Normal gastroesophageal junction and esophagus.  Colonoscopy 01/13/2018 - Stool in the entire examined colon. - A single (solitary) ulcer in the proximal ascending colon at the previous site of APC for AVM. Clips (MR conditional) were placed. - The examination was otherwise normal. - No specimens collected.  DIAGNOSIS:  A. DUODENUM; COLD BIOPSY:  - ACUTE ENTERITIS WITH ISCHEMIC-TYPE FEATURES, IN 2 FRAGMENTS, SEE  COMMENT.  - GASTRIC HETEROTOPIA IN 1 FRAGMENT.  - NEGATIVE FOR DYSPLASIA AND MALIGNANCY.   EGD 12/20/2016 Findings: The duodenal bulb and second portion of the duodenum were normal. Diffuse mildly erythematous mucosa without bleeding was found in the entire examined stomach. A single localized, diminutive non-bleeding erosion was found at the incisura. There were stigmata of recent bleeding. A low-grade of narrowing and moderate Schatzki ring (acquired) was found in the lower third of the esophagus, this explains patient's dysphagia, dilation was not performed as pt is on both coumadin and brilinta. The upper third of the esophagus, middle third and lower third of the esophagus and gastroesophageal junction were otherwise normal. No evidence of esophageal cancer  EGD 11/03/2017 - Normal duodenal bulb and second portion of the  duodenum. - 1 cm hiatal hernia. - Normal stomach. - Esophagogastric landmarks identified. - Non-obstructing Schatzki ring. Dilated to 11mm. - No specimens collected.  Past Medical History:  Diagnosis Date  . Arthritis    "probably in his legs" (08/15/2016)  . Blind right eye   . BPH (benign prostatic hyperplasia)   . Breast asymmetry    Left breast is larger, present for several  years.  [name] CAD (coronary artery disease)    a. Cath in the late 90's - reportedly ok;  b. 2014 s/p stenting x 2 @ [facility]; c 08/19/16 Cath/PCI with DES -> RCA, plan to treat LM 70% medically. Seen by surgery and felt to be too high risk for CABG.  . Chronic combined systolic (congestive) and diastolic (congestive) heart failure ([place])    a. Previously reduced EF-->50% by echo in 2012;  b. 06/2015 Echo: EF 50-55%  c. 07/2016 Echo: EF 45-50%.  . Chronic kidney disease (CKD), stage IV (severe) ([place])   . CML (chronic myelocytic leukemia) ([place])   . GERD (gastroesophageal reflux disease)   . Gout   . Hyperlipemia   . Hypertension   . Hypothyroidism   . Iron deficiency anemia   . Migraine    "in the 1960s" (08/15/2016)  . NICM (nonischemic cardiomyopathy) ([place])    a. Previously reduced EF-->50% by echo in 2012;  b. 06/2015 Echo: EF 50-55%, Gr2 DD, mild MR, mildly dil LA, Ao sclerosis, mild TR;  c. 07/2016 Echo: EF 45-50%, ? inf HK, mild MR, mildly dil LA.  [name] PAF (paroxysmal atrial fibrillation) ([facility])    a. ? Dx 2014-->s/p DCCV;  b. CHA2DS2VASc = 6-->chronic coumadin.  . Prostate cancer ([place])   . RBBB   . Type II diabetes mellitus ([place])     Past Surgical History:  Procedure Laterality Date  . CATARACT EXTRACTION W/ INTRAOCULAR LENS  IMPLANT, BILATERAL Bilateral   . COLONOSCOPY N/A 01/13/2018   Procedure: COLONOSCOPY;  Surgeon: Lin Landsman, MD;  Location: [facility] ENDOSCOPY;  Service: Gastroenterology;  Laterality: N/A;  . COLONOSCOPY WITH PROPOFOL N/A 01/01/2018   Procedure: COLONOSCOPY WITH PROPOFOL;  Surgeon: Lin Landsman, MD;  Location: [facility] ENDOSCOPY;  Service: Gastroenterology;  Laterality: N/A;  . CORONARY ANGIOPLASTY WITH STENT PLACEMENT  09/2012   2 stents  . CORONARY STENT INTERVENTION N/A 08/19/2016   Procedure: Coronary Stent Intervention;  Surgeon: [name], MD;  Location: [facility] CV LAB;  Service: Cardiovascular;  Laterality: N/A;  . ESOPHAGEAL MANOMETRY N/A 12/16/2017     Procedure: ESOPHAGEAL MANOMETRY (EM);  Surgeon: Lin Landsman, MD;  Location: [facility] ENDOSCOPY;  Service: Gastroenterology;  Laterality: N/A;  . ESOPHAGOGASTRODUODENOSCOPY N/A 12/20/2016   Procedure: ESOPHAGOGASTRODUODENOSCOPY (EGD);  Surgeon: Lin Landsman, MD;  Location: [facility] ENDOSCOPY;  Service: Gastroenterology;  Laterality: N/A;  . ESOPHAGOGASTRODUODENOSCOPY N/A 01/13/2018   Procedure: ESOPHAGOGASTRODUODENOSCOPY (EGD);  Surgeon: Lin Landsman, MD;  Location[facility] ENDOSCOPY;  Service: Gastroenterology;  Laterality: N/A;  . ESOPHAGOGASTRODUODENOSCOPY (EGD) WITH PROPOFOL N/A 11/03/2017   Procedure: ESOPHAGOGASTRODUODENOSCOPY (EGD) WITH PROPOFOL;  Surgeon: Lin Landsman, MD;  Location[facility] ENDOSCOPY;  Service: Gastroenterology;  Laterality: N/A;  . EYE SURGERY    . HERNIA REPAIR     "navel"  . LEFT HEART CATH AND CORONARY ANGIOGRAPHY N/A 08/14/2016   Procedure: Left Heart Cath and Coronary Angiography;  Surgeon: [name], MD;  Location: [facility] CV LAB;  Service: Cardiovascular;  Laterality: N/A;  . TOE AMPUTATION Right    "big toe"    Current  Outpatient Medications:  .  acetaminophen (TYLENOL) 500 MG tablet, Take 500 mg by mouth every 6 (six) hours as needed., Disp: , Rfl:  .  albuterol (PROVENTIL HFA;VENTOLIN HFA) 108 (90 Base) MCG/ACT inhaler, Inhale 2 puffs into the lungs every 6 (six) hours as needed for wheezing or shortness of breath., Disp: 1 Inhaler, Rfl: 2 .  Alcohol Swabs 70 % PADS, Use when checking blood sugar up to 3x daily, Disp: 100 each, Rfl: 11 .  allopurinol (ZYLOPRIM) 100 MG tablet, Take 100 mg by mouth daily., Disp: , Rfl: 12 .  aspirin EC 81 MG tablet, Take 1 tablet (81 mg total) by mouth daily., Disp: 90 tablet, Rfl: 3 .  atorvastatin (LIPITOR) 80 MG tablet, Take 1 tablet (80 mg total) by mouth daily., Disp: 90 tablet, Rfl: 1 .  bosutinib (BOSULIF) 100 MG tablet, Take 200 mg by mouth at bedtime. Take with food. , Disp: , Rfl:  .   Dulaglutide (TRULICITY) 1.01 BP/1.0CH SOPN, Inject 0.75 mg into the skin once a week., Disp: 4 pen, Rfl: 0 .  enalapril (VASOTEC) 5 MG tablet, Take 1 tablet (5 mg total) by mouth daily., Disp: 90 tablet, Rfl: 3 .  fluticasone furoate-vilanterol (BREO ELLIPTA) 100-25 MCG/INH AEPB, Inhale 1 puff into the lungs daily., Disp: 28 each, Rfl: 5 .  gabapentin (NEURONTIN) 300 MG capsule, Take 2 capsules in morning, 1 capsule in afternoon, 2 capsules at bedtime. In future increase up to max of 3 capsules 3 times daily, Disp: 180 capsule, Rfl: 0 .  Insulin Detemir (LEVEMIR FLEXTOUCH) 100 UNIT/ML Pen, Inject 60 Units into the skin daily., Disp: 5 pen, Rfl: 3 .  Insulin Pen Needle 31G X 5 MM MISC, 31 g by Does not apply route as directed., Disp: 100 each, Rfl: 5 .  levothyroxine (SYNTHROID, LEVOTHROID) 50 MCG tablet, Take 25 mcg by mouth daily before breakfast. , Disp: , Rfl:  .  loperamide (IMODIUM A-D) 2 MG tablet, Take 2 mg by mouth 3 (three) times daily as needed for diarrhea or loose stools. , Disp: , Rfl:  .  metoprolol succinate (TOPROL-XL) 50 MG 24 hr tablet, Take 1 tablet (50 mg total) by mouth every morning., Disp: 90 tablet, Rfl: 3 .  Multiple Vitamins-Minerals (MULTIVITAMIN ADULTS 50+ PO), Take 1 tablet by mouth every morning., Disp: , Rfl:  .  nepafenac (NEVANAC) 0.1 % ophthalmic suspension, Place 1 drop into both eyes 3 (three) times daily as needed., Disp: , Rfl:  .  nitroGLYCERIN (NITROSTAT) 0.4 MG SL tablet, Place 1 tablet (0.4 mg total) under the tongue every 5 (five) minutes x 3 doses as needed for chest pain., Disp: 75 tablet, Rfl: 2 .  omeprazole (PRILOSEC) 40 MG capsule, Take 1 capsule (40 mg total) by mouth 2 (two) times daily., Disp: 60 capsule, Rfl: 0 .  ranitidine (ZANTAC) 300 MG tablet, Take 1 tablet (300 mg total) by mouth daily., Disp: 90 tablet, Rfl: 3 .  Spacer/Aero-Holding Chambers (OPTICHAMBER ADVANTAGE) MISC, 1 each by Other route once., Disp: 1 each, Rfl: 0 .  torsemide (DEMADEX)  20 MG tablet, Take 20 mg by mouth daily. , Disp: , Rfl:    Family History  Problem Relation Age of Onset  . Brain cancer Father   . Diabetes Sister   . Heart attack Sister      Social History   Tobacco Use  . Smoking status: Former Smoker    Packs/day: 2.00    Years: 15.00    Pack years: 30.00  Types: Cigarettes  . Smokeless tobacco: Former Systems developer  . Tobacco comment: quit 1979  Substance Use Topics  . Alcohol use: No    Comment: previously drank heavily - quit 1979.  . Drug use: No    Allergies as of 02/05/2018 - Review Complete 02/05/2018  Allergen Reaction Noted  . Clopidogrel Anaphylaxis 07/06/2012  . Ciprofloxacin Itching 10/14/2012  . Mirtazapine Diarrhea 12/31/2017  . Spironolactone  03/29/2014    Review of Systems:    All systems reviewed and negative except where noted in HPI.   Physical Exam:  BP (!) 149/78   Pulse 91   Ht 5\' 6"  (1.676 m)   Wt 196 lb 6.4 oz (89.1 kg)   BMI 31.70 kg/m  No LMP for male patient.  General:   Alert,  Well-developed, well-nourished, pleasant and cooperative in NAD Head:  Normocephalic and atraumatic. Eyes:  Sclera clear, no icterus.   Conjunctiva pink. Ears:  Normal auditory acuity. Nose:  No deformity, discharge, or lesions. Mouth:  No deformity or lesions,oropharynx pink & moist. Neck:  Supple; no masses or thyromegaly. Lungs:  Respirations even and unlabored.  Clear throughout to auscultation.   No wheezes, crackles, or rhonchi. No acute distress. Heart:  Regular rate and rhythm; no murmurs, clicks, rubs, or gallops. Abdomen:  Normal bowel sounds. Soft, non-tender and non-distended without masses, hepatosplenomegaly or hernias noted.  No guarding or rebound tenderness.   Rectal: Nor performed Msk:  Symmetrical without gross deformities. Good, equal movement & strength bilaterally. Pulses:  Normal pulses noted. Extremities:  No clubbing or edema.  No cyanosis. Neurologic:  Alert and oriented x3;  grossly normal  neurologically. Skin:  Intact without significant lesions or rashes. No jaundice. Psych:  Alert and cooperative. Normal mood and affect.  Imaging Studies: X-ray upper GI 12/19/2016 FINDINGS: There was normal pharyngeal anatomy and motility. Contrast flowed freely through the esophagus without evidence of stricture or mass. There was normal esophageal mucosa without evidence of irregularity or ulceration. Tertiary contractions of the distal third of the esophagus as can be seen with esophageal spasm. No evidence of reflux. No definite hiatal hernia was demonstrated.  At the end of the examination a 13 mm barium tablet was administered which transited through the esophagus and esophagogastric junction without delay.  IMPRESSION: 1. Tertiary contractions of the distal third of the esophagus as can be seen with esophageal spasm. 2. No esophageal stricture.  Assessment and Plan:   Lucas Caldwell is a 84 y.o. male with coronary artery disease, congestive heart failure, atrial fibrillation on anticoagulation with brilinta and Coumadin, CML, [facility] hospitalization on 12/19/2016 with AKI and 3 months history of  dysphagia to solids and liquids.  EGD showed Nonobstructing Schatzki's ring which was empirically dilated to 21mm with balloon. The patient is able to maintain his weight. He continues to have symptoms of dysphagia.  Acute GI bleed secondary to ischemic duodenitis on 01/11/2018.  He does not have a history of abdominal angina.  Coumadin has been held.  Patient is currently on aspirin 81 mg daily.  Diarrhea resolved  Ischemic duodenitis: Patient is not bleeding at this time Discontinue aspirin 81 mg daily Okay to restart Coumadin from GI standpoint given his chads score is 6.  Will send a note to his cardiologist, Dr.End to resume his Coumadin.   Check CBC in 2 months Continue omeprazole 40 mg twice daily No evidence of celiac artery stenosis that requirs endovascular intervention    Chronic dysphagia: EGD revealed nonobstructing Schatzki's ring  which was empirically dilated to 20 mm Esophageal manometry reveals evolving achalasia based on the preliminary read Continue omeprazole 40 mg twice daily Currently asymptomatic  Early satiety: Patient has poorly controlled diabetes for a long time He may also have underlying gastroparesis Encouraged him to have small frequent meals, cut back on high carbohydrate diet Tight control of diabetes   Follow up in 2 months  [name], MD

## 2018-02-10 ENCOUNTER — Telehealth: Payer: Self-pay | Admitting: Family Medicine

## 2018-02-10 ENCOUNTER — Other Ambulatory Visit: Payer: Self-pay | Admitting: Family Medicine

## 2018-02-10 DIAGNOSIS — E1142 Type 2 diabetes mellitus with diabetic polyneuropathy: Secondary | ICD-10-CM

## 2018-02-10 NOTE — Telephone Encounter (Signed)
Sample for basaglar provided to the patient.

## 2018-02-10 NOTE — Telephone Encounter (Signed)
[name] asked for sample of needles for pt [phone]

## 2018-02-11 DIAGNOSIS — E119 Type 2 diabetes mellitus without complications: Secondary | ICD-10-CM | POA: Diagnosis not present

## 2018-02-11 DIAGNOSIS — H35372 Puckering of macula, left eye: Secondary | ICD-10-CM | POA: Diagnosis not present

## 2018-02-11 DIAGNOSIS — H1859 Other hereditary corneal dystrophies: Secondary | ICD-10-CM | POA: Diagnosis not present

## 2018-02-11 DIAGNOSIS — H35342 Macular cyst, hole, or pseudohole, left eye: Secondary | ICD-10-CM | POA: Diagnosis not present

## 2018-02-16 ENCOUNTER — Ambulatory Visit (INDEPENDENT_AMBULATORY_CARE_PROVIDER_SITE_OTHER): Payer: Medicare HMO

## 2018-02-16 DIAGNOSIS — Z7901 Long term (current) use of anticoagulants: Secondary | ICD-10-CM

## 2018-02-16 DIAGNOSIS — I480 Paroxysmal atrial fibrillation: Secondary | ICD-10-CM | POA: Diagnosis not present

## 2018-02-16 DIAGNOSIS — Z5181 Encounter for therapeutic drug level monitoring: Secondary | ICD-10-CM | POA: Diagnosis not present

## 2018-02-16 LAB — POCT INR: INR: 1.5 — AB (ref 2.0–3.0)

## 2018-02-16 NOTE — Patient Instructions (Signed)
Please take extra tablet tonight & tomorrow, then continue dosage of 1 TABLET EVERY DAY EXCEPT 1/2 TABLET ON MONDAYS & FRIDAYS. Recheck INR in 2 weeks.

## 2018-02-17 ENCOUNTER — Telehealth: Payer: Self-pay | Admitting: Family Medicine

## 2018-02-17 NOTE — Telephone Encounter (Signed)
[facility] requesting another sample of [name] for this month.

## 2018-02-17 NOTE — Telephone Encounter (Signed)
Pt's wife has question about diabetic medication [phone]

## 2018-02-18 ENCOUNTER — Telehealth: Payer: Self-pay | Admitting: Family Medicine

## 2018-02-18 NOTE — Telephone Encounter (Signed)
As per Dr. Raliegh Ip her Rx send in 02/13/18.

## 2018-02-18 NOTE — Telephone Encounter (Signed)
I contacted the patient pharmacy and they informed me that the patient prescriptions is $72.87 for a 30 day script, because the patient is in the donut hole.

## 2018-02-18 NOTE — Telephone Encounter (Signed)
Since patient is in donut hole, we can provide one sample a month for now temporarily.  His last sample [name] was given end of November 11/26  Please notify patient that we can get them a new sample by end of December - earliest will be 03/09/18. They can pick it up at that time.  [name], [facility] 02/18/2018, 6:10 PM

## 2018-02-18 NOTE — Telephone Encounter (Signed)
[name] asked if there was anything cheaper than levemir [phone]

## 2018-02-19 NOTE — Telephone Encounter (Signed)
Called Lucas Caldwell given sample of [place] for December.

## 2018-03-04 ENCOUNTER — Ambulatory Visit (INDEPENDENT_AMBULATORY_CARE_PROVIDER_SITE_OTHER): Payer: Medicare HMO

## 2018-03-04 DIAGNOSIS — I480 Paroxysmal atrial fibrillation: Secondary | ICD-10-CM

## 2018-03-04 DIAGNOSIS — Z7901 Long term (current) use of anticoagulants: Secondary | ICD-10-CM

## 2018-03-04 LAB — POCT INR: INR: 1.5 — AB (ref 2.0–3.0)

## 2018-03-04 NOTE — Patient Instructions (Signed)
Please take 2 tablets tonight & tomorrow, then continue dosage of 1 TABLET EVERY DAY. Recheck INR in 2 weeks.

## 2018-03-05 ENCOUNTER — Other Ambulatory Visit: Admit: 2018-03-05 | Discharge: 2018-03-06 | Payer: MEDICARE

## 2018-03-05 ENCOUNTER — Ambulatory Visit: Admit: 2018-03-05 | Discharge: 2018-03-06 | Payer: MEDICARE

## 2018-03-05 DIAGNOSIS — C9210 Chronic myeloid leukemia, BCR/ABL-positive, not having achieved remission: Secondary | ICD-10-CM

## 2018-03-05 DIAGNOSIS — M79674 Pain in right toe(s): Principal | ICD-10-CM

## 2018-03-06 ENCOUNTER — Ambulatory Visit (INDEPENDENT_AMBULATORY_CARE_PROVIDER_SITE_OTHER): Payer: Medicare HMO | Admitting: Internal Medicine

## 2018-03-06 ENCOUNTER — Telehealth: Payer: Self-pay | Admitting: Internal Medicine

## 2018-03-06 ENCOUNTER — Encounter: Payer: Self-pay | Admitting: Internal Medicine

## 2018-03-06 VITALS — BP 128/68 | HR 75 | Ht 64.0 in | Wt 198.5 lb

## 2018-03-06 DIAGNOSIS — I453 Trifascicular block: Secondary | ICD-10-CM

## 2018-03-06 DIAGNOSIS — I2510 Atherosclerotic heart disease of native coronary artery without angina pectoris: Secondary | ICD-10-CM

## 2018-03-06 DIAGNOSIS — M79606 Pain in leg, unspecified: Secondary | ICD-10-CM

## 2018-03-06 DIAGNOSIS — I429 Cardiomyopathy, unspecified: Secondary | ICD-10-CM

## 2018-03-06 DIAGNOSIS — I480 Paroxysmal atrial fibrillation: Secondary | ICD-10-CM | POA: Diagnosis not present

## 2018-03-06 MED ORDER — METOPROLOL SUCCINATE ER 25 MG PO TB24: 25.0000 mg | ORAL_TABLET | Freq: Every day | ORAL | 3 refills | Status: DC

## 2018-03-06 NOTE — Telephone Encounter (Signed)
Pharmacy has 2 rx for metoprolol  Per pcp 50 mg po q d  Per End 25 mg po q d   Please call to confirm correct dose

## 2018-03-06 NOTE — Patient Instructions (Signed)
Medication Instructions:  Your physician has recommended you make the following change in your medication:  1- DECREASE Metoprolol to 25 mg by mouth once a day.  If you need a refill on your cardiac medications before your next appointment, please call your pharmacy.   Lab work: none If you have labs (blood work) drawn today and your tests are completely normal, you will receive your results only by: [name] MyChart Message (if you have MyChart) OR . A paper copy in the mail If you have any lab test that is abnormal or we need to change your treatment, we will call you to review the results.  Testing/Procedures: Your physician has requested that you have a lower extremity arterial doppler- During this test, ultrasound is used to evaluate arterial blood flow in the legs. Allow approximately one hour for this exam.   Your physician has requested that you have an ankle brachial index (ABI). During this test an ultrasound and blood pressure cuff are used to evaluate the arteries that supply the arms and legs with blood. Allow thirty minutes for this exam. There are no restrictions or special instructions.    Follow-Up: At [facility], you and your health needs are our priority.  As part of our continuing mission to provide you with exceptional heart care, we have created designated Provider Care Teams.  These Care Teams include your primary Cardiologist (physician) and Advanced Practice Providers (APPs -  Physician Assistants and Nurse Practitioners) who all work together to provide you with the care you need, when you need it. You will need a follow up appointment in 6 weeks.   You may see [name], MD or one of the following Advanced Practice Providers on your designated Care Team:   [name], NP [name], PA-C . [name], PA-C

## 2018-03-06 NOTE — Telephone Encounter (Signed)
Spoke with pharmacist and clarified that patient was seen by Dr End today who decreased it to 25 mg once a day. She verbalized understanding.

## 2018-03-06 NOTE — Progress Notes (Signed)
Follow-up Outpatient Visit Date: 03/06/2018  Primary Care Provider: [name], DO [address] [address]  Chief Complaint: Follow-up CAD, cardiomyopathy, and atrial fibrillation  HPI:  Lucas Caldwell is a 84 y.o. year-old male with history of  coronary artery disease, mixed ischemic and nonischemic cardiomyopathy, hypertension, hyperlipidemia, type 2 diabetes mellitus, paroxysmal atrial fibrillation, chronic kidney disease stage III, CML, iron deficiency anemia, GI bleed due to duodenal ulcers, BPH, and hypothyroidism, who presents for follow-up of coronary artery disease, cardiomyopathy, and atrial fibrillation.  I last saw Lucas Caldwell in early November following a preceding GI bleed requiring cessation of anticoagulation and antiplatelet therapy.  Endoscopy showed multiple duodenal ulcers as well as oozing from recently treated colonic angiectasia.  He was most recently seen by GI in late November and has been restarted on warfarin.  Today, Lucas Caldwell reports that he had been feeling well up until the last 2 days when he began having pain and a cold feeling in both legs.  This has been most pronounced at night when he is in bed.  He wonders if this is related to having recently started eating cereal for dinner.  He denies trauma.  He has not had any chest pain nor shortness of breath, palpitations, or lightheadedness.  He notes intermittent edema in both legs, for which she is now taking torsemide 20 mg twice daily (prescribed as 40 mg daily).  He has also used compression stockings in the past with relief of leg swelling.  [name] back on warfarin and has not had any bleeding.  His weight is up 3 pounds from baseline per home scale.  --------------------------------------------------------------------------------------------------  Past Medical History:  Diagnosis Date  . Arthritis    "probably in his legs" (08/15/2016)  . Blind right eye   . BPH (benign prostatic  hyperplasia)   . Breast asymmetry    Left breast is larger, present for several years.  [name] CAD (coronary artery disease)    a. Cath in the late 90's - reportedly ok;  b. 2014 s/p stenting x 2 @ [facility]; c 08/19/16 Cath/PCI with DES -> RCA, plan to treat LM 70% medically. Seen by surgery and felt to be too high risk for CABG.  . Chronic combined systolic (congestive) and diastolic (congestive) heart failure ([place])    a. Previously reduced EF-->50% by echo in 2012;  b. 06/2015 Echo: EF 50-55%  c. 07/2016 Echo: EF 45-50%.  . Chronic kidney disease (CKD), stage IV (severe) ([place])   . CML (chronic myelocytic leukemia) ([place])   . GERD (gastroesophageal reflux disease)   . Gout   . Hyperlipemia   . Hypertension   . Hypothyroidism   . Iron deficiency anemia   . Migraine    "in the 1960s" (08/15/2016)  . NICM (nonischemic cardiomyopathy) ([place])    a. Previously reduced EF-->50% by echo in 2012;  b. 06/2015 Echo: EF 50-55%, Gr2 DD, mild MR, mildly dil LA, Ao sclerosis, mild TR;  c. 07/2016 Echo: EF 45-50%, ? inf HK, mild MR, mildly dil LA.  [name] PAF (paroxysmal atrial fibrillation) ([facility])    a. ? Dx 2014-->s/p DCCV;  b. CHA2DS2VASc = 6-->chronic coumadin.  . Prostate cancer ([place])   . RBBB   . Type II diabetes mellitus ([place])    Past Surgical History:  Procedure Laterality Date  . CATARACT EXTRACTION W/ INTRAOCULAR LENS  IMPLANT, BILATERAL Bilateral   . COLONOSCOPY N/A 01/13/2018   Procedure: COLONOSCOPY;  Surgeon: Lin Landsman,  MD;  Location: [facility] ENDOSCOPY;  Service: Gastroenterology;  Laterality: N/A;  . COLONOSCOPY WITH PROPOFOL N/A 01/01/2018   Procedure: COLONOSCOPY WITH PROPOFOL;  Surgeon: Lin Landsman, MD;  Location: [facility] ENDOSCOPY;  Service: Gastroenterology;  Laterality: N/A;  . CORONARY ANGIOPLASTY WITH STENT PLACEMENT  09/2012   2 stents  . CORONARY STENT INTERVENTION N/A 08/19/2016   Procedure: Coronary Stent Intervention;  Surgeon: [name], MD;  Location: [place] CV LAB;   Service: Cardiovascular;  Laterality: N/A;  . ESOPHAGEAL MANOMETRY N/A 12/16/2017   Procedure: ESOPHAGEAL MANOMETRY (EM);  Surgeon: Lin Landsman, MD;  Location: [facility] ENDOSCOPY;  Service: Gastroenterology;  Laterality: N/A;  . ESOPHAGOGASTRODUODENOSCOPY N/A 12/20/2016   Procedure: ESOPHAGOGASTRODUODENOSCOPY (EGD);  Surgeon: Lin Landsman, MD;  Location: [facility] ENDOSCOPY;  Service: Gastroenterology;  Laterality: N/A;  . ESOPHAGOGASTRODUODENOSCOPY N/A 01/13/2018   Procedure: ESOPHAGOGASTRODUODENOSCOPY (EGD);  Surgeon: Lin Landsman, MD;  Location: [facility] ENDOSCOPY;  Service: Gastroenterology;  Laterality: N/A;  . ESOPHAGOGASTRODUODENOSCOPY (EGD) WITH PROPOFOL N/A 11/03/2017   Procedure: ESOPHAGOGASTRODUODENOSCOPY (EGD) WITH PROPOFOL;  Surgeon: Lin Landsman, MD;  Location: [facility] ENDOSCOPY;  Service: Gastroenterology;  Laterality: N/A;  . EYE SURGERY    . HERNIA REPAIR     "navel"  . LEFT HEART CATH AND CORONARY ANGIOGRAPHY N/A 08/14/2016   Procedure: Left Heart Cath and Coronary Angiography;  Surgeon: [name], MD;  Location: [place] CV LAB;  Service: Cardiovascular;  Laterality: N/A;  . TOE AMPUTATION Right    "big toe"    Current Meds  Medication Sig  . acetaminophen (TYLENOL) 500 MG tablet Take 500 mg by mouth every 6 (six) hours as needed.  [name] albuterol (PROVENTIL HFA;VENTOLIN HFA) 108 (90 Base) MCG/ACT inhaler Inhale 2 puffs into the lungs every 6 (six) hours as needed for wheezing or shortness of breath.  . Alcohol Swabs 70 % PADS Use when checking blood sugar up to 3x daily  . allopurinol (ZYLOPRIM) 100 MG tablet Take 1 tablet (100 mg total) by mouth daily.  [name] atorvastatin (LIPITOR) 80 MG tablet Take 1 tablet (80 mg total) by mouth daily.  . bosutinib (BOSULIF) 100 MG tablet Take 200 mg by mouth at bedtime. Take with food.   . Dulaglutide (TRULICITY) 3.87 FI/4.3PI SOPN Inject 0.75 mg into the skin once a week.  . enalapril (VASOTEC) 5 MG tablet Take 1  tablet (5 mg total) by mouth daily.  . fluticasone furoate-vilanterol (BREO ELLIPTA) 100-25 MCG/INH AEPB Inhale 1 puff into the lungs daily.  [name] gabapentin (NEURONTIN) 300 MG capsule TAKE TWO CAPSULES BY MOUTH EVERY MORNING, ONE CAP IN AFTERNOON, & 2 CAPS AT BEDTIME. IN FUTURE INCREASE UP TO MAX OF 3 CAPS 3 TIMES A DAY  . Insulin Detemir (LEVEMIR FLEXTOUCH) 100 UNIT/ML Pen Inject 60 Units into the skin daily.  . Insulin Pen Needle 31G X 5 MM MISC 31 g by Does not apply route as directed.  [name] levothyroxine (SYNTHROID, LEVOTHROID) 50 MCG tablet Take 25 mcg by mouth daily before breakfast.   . loperamide (IMODIUM A-D) 2 MG tablet Take 2 mg by mouth 3 (three) times daily as needed for diarrhea or loose stools.   . metoprolol succinate (TOPROL-XL) 50 MG 24 hr tablet Take 1 tablet (50 mg total) by mouth every morning.  . Multiple Vitamins-Minerals (MULTIVITAMIN ADULTS 50+ PO) Take 1 tablet by mouth every morning.  . nepafenac (NEVANAC) 0.1 % ophthalmic suspension Place 1 drop into both eyes 3 (three) times daily as needed.  . nitroGLYCERIN (NITROSTAT) 0.4 MG  SL tablet Place 1 tablet (0.4 mg total) under the tongue every 5 (five) minutes x 3 doses as needed for chest pain.  [name] omeprazole (PRILOSEC) 40 MG capsule Take 1 capsule (40 mg total) by mouth 2 (two) times daily.  [name] Spacer/Aero-Holding Chambers (OPTICHAMBER ADVANTAGE) MISC 1 each by Other route once.  . torsemide (DEMADEX) 20 MG tablet Take 20 mg by mouth daily.     Allergies: Clopidogrel; Ciprofloxacin; Mirtazapine; and Spironolactone  Social History   Tobacco Use  . Smoking status: Former Smoker    Packs/day: 2.00    Years: 15.00    Pack years: 30.00    Types: Cigarettes  . Smokeless tobacco: Former Systems developer  . Tobacco comment: quit 1979  Substance Use Topics  . Alcohol use: No    Comment: previously drank heavily - quit 1979.  . Drug use: No    Family History  Problem Relation Age of Onset  . Brain cancer Father   . Diabetes Sister     . Heart attack Sister     Review of Systems: A 12-system review of systems was performed and was negative except as noted in the HPI.  --------------------------------------------------------------------------------------------------  Physical Exam: BP 128/68 (BP Location: Left Arm, Patient Position: Sitting, Cuff Size: Large)   Pulse 75   Ht 5\' 4"  (1.626 m)   Wt 198 lb 8 oz (90 kg)   BMI 34.07 kg/m   General: NAD.  Accompanied by his wife. HEENT: No conjunctival pallor or scleral icterus. Moist mucous membranes.  OP clear. Neck: Supple without lymphadenopathy, thyromegaly, JVD, or HJR. Lungs: Normal work of breathing. Clear to auscultation bilaterally without wheezes or crackles. Heart: Regular rate and rhythm without murmurs, rubs, or gallops. Non-displaced PMI. Abd: Bowel sounds present. Soft, NT/ND.  Unable to assess HSM due to body habitus. Ext: 1+ ankle edema bilaterally.  Radial and pedal pulses are 1+.  Toes are cool but without pallor or cyanosis.  Remainder of both lower extremities are warm to touch.  Normal capillary refill noted. Skin: No rash.  EKG: Normal sinus rhythm with first-degree AV block, occasional PVCs, right bundle branch block, and left anterior fascicular block.  Lab Results  Component Value Date   WBC 7.9 01/23/2018   HGB 9.4 (L) 01/23/2018   HCT 29.9 (L) 01/23/2018   MCV 92.9 01/23/2018   PLT 280 01/23/2018    Lab Results  Component Value Date   NA 138 01/14/2018   K 3.8 01/14/2018   CL 107 01/14/2018   CO2 21 (L) 01/14/2018   BUN 8 01/14/2018   CREATININE 1.01 01/14/2018   GLUCOSE 152 (H) 01/14/2018   ALT 31 01/11/2018    Lab Results  Component Value Date   CHOL 99 01/08/2017   HDL 21 (L) 01/08/2017   LDLCALC 54 01/08/2017   TRIG 118 01/08/2017   CHOLHDL 4.7 01/08/2017    --------------------------------------------------------------------------------------------------  ASSESSMENT AND PLAN: Coronary artery disease No  symptoms to suggest worsening coronary insufficiency.  Continue current medications for secondary prevention.  We will continue to hold aspirin in the setting of recent GI bleed and ongoing anticoagulation with warfarin.  Given the lack of further chest pain, we will continue to hold isosorbide mononitrate, which was discontinued in the setting of hypotension with his GI bleed last month.  Cardiomyopathy Lucas Caldwell appears slightly volume up today.  His weight is slightly above baseline.  [name] already on torsemide 20 mg twice daily now.  We will continue with this dose; I  have encouraged Lucas Caldwell to limit his sodium intake and to contact us if his weight rises further or he has progressive leg swelling.  Paroxysmal atrial fibrillation EKG today demonstrates sinus rhythm.  Continue indefinite anticoagulation with warfarin as long as bleeding does not recur.  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  Certainly these findings are suggestive of worsening conduction disease.  Lucas Caldwell does not have any obvious symptoms of high-grade AV block, though he notes some fatigue.  I do not think that his leg discomfort is related to this.  Nonetheless, we will decrease metoprolol to 25 mg daily.  Ultimately this may need to be stopped and EP consultation obtained if PR prolongation persists/worsens or higher grade AV block develops.  Leg pain New over the last 2 days.  Constellation of symptoms is unusual for claudication.  Pedal pulses are mildly diminished on exam today.  We have agreed to obtain ABIs for further assessment.  If symptoms worsen significantly, advised Lucas Caldwell to seek immediate medical attention, though his history and exam today are not consistent with acute limb ischemia.  Follow-up: Return to clinic in 6 weeks.  [name], MD 03/06/2018 10:47 AM

## 2018-03-16 ENCOUNTER — Ambulatory Visit: Payer: Medicare HMO | Admitting: Internal Medicine

## 2018-03-16 ENCOUNTER — Ambulatory Visit (INDEPENDENT_AMBULATORY_CARE_PROVIDER_SITE_OTHER): Payer: Medicare HMO

## 2018-03-16 DIAGNOSIS — Z7901 Long term (current) use of anticoagulants: Secondary | ICD-10-CM | POA: Diagnosis not present

## 2018-03-16 DIAGNOSIS — I480 Paroxysmal atrial fibrillation: Secondary | ICD-10-CM

## 2018-03-16 LAB — POCT INR: INR: 1.8 — AB (ref 2.0–3.0)

## 2018-03-16 NOTE — Patient Instructions (Signed)
Please take 2 tablets tonight, then START NEW DOSAGE of 1 TABLET EVERY DAY EXCEPT 1.5 TABLETS ON [place]. Recheck INR in 2 weeks.

## 2018-03-17 ENCOUNTER — Encounter: Payer: Self-pay | Admitting: Nurse Practitioner

## 2018-03-17 ENCOUNTER — Ambulatory Visit (INDEPENDENT_AMBULATORY_CARE_PROVIDER_SITE_OTHER): Payer: Medicare HMO | Admitting: Nurse Practitioner

## 2018-03-17 VITALS — BP 125/73 | HR 87 | Temp 98.3°F | Resp 16 | Ht 64.0 in | Wt 199.0 lb

## 2018-03-17 DIAGNOSIS — J019 Acute sinusitis, unspecified: Secondary | ICD-10-CM

## 2018-03-17 NOTE — Progress Notes (Signed)
Subjective:    Patient ID: Lucas Caldwell, male    DOB: [birth year], 84 y.o.   MRN: [id number]  Lucas Caldwell is a 84 y.o. male presenting on 03/17/2018 for chest congestion (runny nose, sneezing denies sore throat or ear pain or chills or fever onset 6 days)   HPI URI Had initial symptoms Saturday (3 days ago).   Rhinorrhea, cough, chest soreness, sneezing.  Feels some shortness of breath occasionally.   - No fever, chills, or sweats  - No ear pain or sore throat. - No known sick contacts.  Patient attributes this to wearing only a T-shirt outside while taking down Christmas decorations.  Social History   Tobacco Use  . Smoking status: Former Smoker    Packs/day: 2.00    Years: 15.00    Pack years: 30.00    Types: Cigarettes  . Smokeless tobacco: Former Systems developer  . Tobacco comment: quit 1979  Substance Use Topics  . Alcohol use: No    Comment: previously drank heavily - quit 1979.  . Drug use: No    Review of Systems Per HPI unless specifically indicated above     Objective:    BP 125/73   Pulse 87   Temp 98.3 F (36.8 C) (Oral)   Resp 16   Ht 5\' 4"  (1.626 m)   Wt 199 lb (90.3 kg)   SpO2 100%   BMI 34.16 kg/m   Wt Readings from Last 3 Encounters:  03/17/18 199 lb (90.3 kg)  03/06/18 198 lb 8 oz (90 kg)  02/05/18 196 lb 6.4 oz (89.1 kg)    Physical Exam Vitals signs reviewed.  Constitutional:      General: [name] not in acute distress.    Appearance: [name] well-developed.  HENT:     Head: Normocephalic and atraumatic.     Right Ear: Tympanic membrane, ear canal and external ear normal.     Left Ear: Tympanic membrane, ear canal and external ear normal.     Nose: Nose normal.     Mouth/Throat:     Mouth: Mucous membranes are moist.     Pharynx: Oropharynx is clear. No oropharyngeal exudate or posterior oropharyngeal erythema.  Eyes:     Extraocular Movements: Extraocular movements intact.     Conjunctiva/sclera: Conjunctivae normal.     Pupils:  Pupils are equal, round, and reactive to light.  Cardiovascular:     Rate and Rhythm: Normal rate and regular rhythm.     Pulses:          Radial pulses are 2+ on the right side and 2+ on the left side.       Posterior tibial pulses are 1+ on the right side and 1+ on the left side.     Heart sounds: Normal heart sounds, S1 normal and S2 normal.  Pulmonary:     Effort: Pulmonary effort is normal. No respiratory distress.     Breath sounds: Normal breath sounds and air entry.  Musculoskeletal:     Right lower leg: No edema.     Left lower leg: No edema.  Skin:    General: Skin is warm and dry.     Capillary Refill: Capillary refill takes less than 2 seconds.  Neurological:     Mental Status: [name] alert and oriented to person, place, and time.  Psychiatric:        Attention and Perception: Attention normal.        Mood and Affect: Mood  and affect normal.        Behavior: Behavior normal. Behavior is cooperative.    Results for orders placed or performed in visit on 03/16/18  POCT INR  Result Value Ref Range   INR 1.8 (A) 2.0 - 3.0      Assessment & Plan:   Problem List Items Addressed This Visit    None    Visit Diagnoses    Acute rhinosinusitis    -  Primary    Acute illness. Fever responsive to NSAIDs and tylenol.  Symptoms not worsening. Consistent with viral illness x 3 days with no known sick contacts and no identifiable focal infections of ears, nose, throat.  Plan: 1. Reassurance, likely self-limited with cough lasting up to few weeks - Start Atrovent nasal spray decongestant 2 sprays each nostril up to 4 times daily for 5-7 days - Start anti-histamine Loratadine or Cetirizine 10mg  daily,  - also can use Flonase 2 sprays each nostril daily for up to 4-6 weeks - Start Mucinex-DM OTC up to 7-10 days then stop - May continue tessalon perles 100 mg every 12 hours as needed for cough. 2. Supportive care with nasal saline, warm herbal tea with honey, 3. Improve  hydration 4. Tylenol / Motrin PRN fevers 5. Return criteria given    Follow up plan: Return in 7 days if symptoms worsen or fail to improve.  Cassell Smiles, DNP, AGPCNP-BC Adult Gerontology Primary Care Nurse Practitioner [facility] 03/17/2018, 4:13 PM

## 2018-03-17 NOTE — Patient Instructions (Addendum)
Lucas Caldwell,   Thank you for coming in to clinic today.  1. It sounds like you have a Upper Respiratory Virus - this will most likely run it's course in 7 to 10 days. Recommend good hand washing. - May use Flonase 2 sprays  - If congestion is worse, start OTC Mucinex or generic guaifenesin (or may try Mucinex-DM for cough) up to 7-10 days then stop - Drink plenty of fluids to improve congestion - You may try over the counter Nasal Saline spray (Simply Saline, Ocean Spray) as needed to reduce congestion. - Drink warm herbal tea with honey for sore throat. - Start taking Tylenol extra strength 1 to 2 tablets every 6-8 hours for aches or fever/chills for next few days as needed.  Do not take more than 3,000 mg in 24 hours from all medicines.  May take Ibuprofen as well if tolerated 200-400mg  every 8 hours as needed.  If symptoms significantly worsening with persistent fevers/chills despite tylenol/ibpurofen, nausea, vomiting unable to tolerate food/fluids or medicine, body aches, or shortness of breath, sinus pain pressure or worsening productive cough, then follow-up for re-evaluation, may seek more immediate care at Urgent Care or ED if more concerned for emergency.  Please schedule a follow-up appointment with Cassell Smiles, AGNP. Return in 7 days if symptoms worsen or fail to improve.  You can call clinic for possible antibiotics in 1 week if symptoms are not improved.  If you have any other questions or concerns, please feel free to call the clinic or send a message through [place]. You may also schedule an earlier appointment if necessary.  You will receive a survey after today's visit either digitally by e-mail or paper by [employer]. Your experiences and feedback matter to [place].  Please respond so we know how we are doing as we provide care for you.   Cassell Smiles, DNP, AGNP-BC Adult Gerontology Nurse Practitioner [place]

## 2018-03-23 ENCOUNTER — Other Ambulatory Visit: Payer: Self-pay

## 2018-03-23 MED ORDER — WARFARIN SODIUM 3 MG PO TABS: 3.0000 mg | ORAL_TABLET | Freq: Every day | ORAL | 0 refills | Status: DC

## 2018-03-23 NOTE — Telephone Encounter (Signed)
°*  STAT* If patient is at the pharmacy, call can be transferred to refill team.   1. Which medications need to be refilled? (please list name of each medication and dose if known) Coumadin 3 mg po as directed   2. Which pharmacy/location (including street and city if local pharmacy) is medication to be sent to?[facility]  3. Do they need a 30 day or 90 day supply? [place]

## 2018-03-24 ENCOUNTER — Other Ambulatory Visit: Payer: Self-pay | Admitting: Family Medicine

## 2018-03-24 NOTE — Telephone Encounter (Signed)
It patient is having symptom improvement, he will not require antibiotics.  It can sometimes take up to two weeks for full improvement.  Patient has a virus and antibiotics do not treat viruses.

## 2018-03-24 NOTE — Telephone Encounter (Signed)
The pt and wife was notified. No questions or concerns.

## 2018-03-24 NOTE — Telephone Encounter (Signed)
Patient was last seen by Cassell Smiles, AGPCNP-BC on 03/17/18 - will forward phone request to her for review.  [name], DO [facility] 03/24/2018, 1:33 PM

## 2018-03-24 NOTE — Telephone Encounter (Signed)
Pt wife called said that pt was better but she think he need antibiotics called in

## 2018-03-25 NOTE — Telephone Encounter (Signed)
Refill sent in on 03/23/18.

## 2018-03-27 ENCOUNTER — Ambulatory Visit: Payer: Medicare HMO | Admitting: Gastroenterology

## 2018-03-29 ENCOUNTER — Other Ambulatory Visit: Payer: Self-pay | Admitting: Family Medicine

## 2018-03-29 DIAGNOSIS — E782 Mixed hyperlipidemia: Secondary | ICD-10-CM

## 2018-03-30 ENCOUNTER — Other Ambulatory Visit: Payer: Self-pay | Admitting: Family Medicine

## 2018-03-30 ENCOUNTER — Telehealth: Payer: Self-pay | Admitting: Internal Medicine

## 2018-03-30 DIAGNOSIS — M1A39X0 Chronic gout due to renal impairment, multiple sites, without tophus (tophi): Secondary | ICD-10-CM

## 2018-03-30 DIAGNOSIS — E039 Hypothyroidism, unspecified: Secondary | ICD-10-CM

## 2018-03-30 NOTE — Telephone Encounter (Signed)
Patient wife calling Has a question regarding coumadin medication she would like answered before he has to take medication this evening Please call to discuss

## 2018-03-30 NOTE — Telephone Encounter (Signed)
Spoke w/ pt's wife.  Clarified that pt's dosage is 1.5 tablets tonight, 1 tablet tomorrow and then recheck on Wednesday.  She is appreciative of the call back and will keep upcoming appt.

## 2018-03-31 MED ORDER — LEVOTHYROXINE SODIUM 25 MCG PO TABS: 25.0000 ug | ORAL_TABLET | Freq: Every day | ORAL | 1 refills | Status: DC

## 2018-03-31 MED ORDER — ALLOPURINOL 100 MG PO TABS: 100.0000 mg | ORAL_TABLET | Freq: Every day | ORAL | 1 refills | Status: DC

## 2018-04-01 ENCOUNTER — Ambulatory Visit (INDEPENDENT_AMBULATORY_CARE_PROVIDER_SITE_OTHER): Payer: Medicare HMO

## 2018-04-01 DIAGNOSIS — I480 Paroxysmal atrial fibrillation: Secondary | ICD-10-CM | POA: Diagnosis not present

## 2018-04-01 DIAGNOSIS — Z7901 Long term (current) use of anticoagulants: Secondary | ICD-10-CM | POA: Diagnosis not present

## 2018-04-01 LAB — POCT INR: INR: 2.6 (ref 2.0–3.0)

## 2018-04-01 NOTE — Patient Instructions (Signed)
Please continue dosage of 1 TABLET EVERY DAY EXCEPT 1.5 TABLETS ON MONDAYS & FRIDAYS. Recheck INR in 3 weeks.

## 2018-04-08 ENCOUNTER — Ambulatory Visit (INDEPENDENT_AMBULATORY_CARE_PROVIDER_SITE_OTHER): Payer: Medicare HMO

## 2018-04-08 ENCOUNTER — Other Ambulatory Visit: Payer: Self-pay | Admitting: Internal Medicine

## 2018-04-08 DIAGNOSIS — M79606 Pain in leg, unspecified: Secondary | ICD-10-CM

## 2018-04-09 ENCOUNTER — Telehealth: Payer: Self-pay | Admitting: Internal Medicine

## 2018-04-09 NOTE — Telephone Encounter (Signed)
Patient calling to discuss recent Vascular testing results   Please call

## 2018-04-09 NOTE — Telephone Encounter (Signed)
[name] calling back, ok per DPR. She verbalized understanding of ABI results.

## 2018-04-17 ENCOUNTER — Other Ambulatory Visit: Payer: Self-pay

## 2018-04-17 MED ORDER — WARFARIN SODIUM 3 MG PO TABS: 3.0000 mg | ORAL_TABLET | Freq: Every day | ORAL | 5 refills | Status: DC

## 2018-04-22 ENCOUNTER — Ambulatory Visit (INDEPENDENT_AMBULATORY_CARE_PROVIDER_SITE_OTHER): Payer: Medicare HMO

## 2018-04-22 ENCOUNTER — Ambulatory Visit (INDEPENDENT_AMBULATORY_CARE_PROVIDER_SITE_OTHER): Payer: Medicare HMO | Admitting: Nurse Practitioner

## 2018-04-22 ENCOUNTER — Encounter: Payer: Self-pay | Admitting: Nurse Practitioner

## 2018-04-22 VITALS — BP 130/80 | HR 90 | Ht 64.0 in | Wt 207.0 lb

## 2018-04-22 DIAGNOSIS — M791 Myalgia, unspecified site: Secondary | ICD-10-CM

## 2018-04-22 DIAGNOSIS — I480 Paroxysmal atrial fibrillation: Secondary | ICD-10-CM | POA: Diagnosis not present

## 2018-04-22 DIAGNOSIS — E1122 Type 2 diabetes mellitus with diabetic chronic kidney disease: Secondary | ICD-10-CM | POA: Diagnosis not present

## 2018-04-22 DIAGNOSIS — Z794 Long term (current) use of insulin: Secondary | ICD-10-CM

## 2018-04-22 DIAGNOSIS — E785 Hyperlipidemia, unspecified: Secondary | ICD-10-CM | POA: Diagnosis not present

## 2018-04-22 DIAGNOSIS — N184 Chronic kidney disease, stage 4 (severe): Secondary | ICD-10-CM

## 2018-04-22 DIAGNOSIS — Z7901 Long term (current) use of anticoagulants: Secondary | ICD-10-CM

## 2018-04-22 DIAGNOSIS — R0600 Dyspnea, unspecified: Secondary | ICD-10-CM | POA: Diagnosis not present

## 2018-04-22 DIAGNOSIS — I5032 Chronic diastolic (congestive) heart failure: Secondary | ICD-10-CM | POA: Diagnosis not present

## 2018-04-22 DIAGNOSIS — I2510 Atherosclerotic heart disease of native coronary artery without angina pectoris: Secondary | ICD-10-CM | POA: Diagnosis not present

## 2018-04-22 LAB — POCT INR: INR: 2.8 (ref 2.0–3.0)

## 2018-04-22 MED ORDER — NITROGLYCERIN 0.4 MG SL SUBL: 0.4000 mg | SUBLINGUAL_TABLET | SUBLINGUAL | 2 refills | Status: DC | PRN

## 2018-04-22 NOTE — Progress Notes (Signed)
Office Visit    Patient Name: Lucas Caldwell Date of Encounter: 04/22/2018  Primary Care Provider:  [name], DO Primary Cardiologist:  Lucas Bush, MD  Chief Complaint    85 year old male with a history of coronary artery disease status post prior RCA stenting, mixed ischemic and nonischemic cardiomyopathy, HFpEF, hypertension, hyperlipidemia, type 2 diabetes mellitus, paroxysmal atrial fibrillation, chronic kidney disease stage III-IV, CML, iron deficiency anemia, GI bleed due to duodenal ulcers in October 2019, BPH, and hypothyroidism, who presents for follow-up related to dyspnea and lower extremity myalgias.  Past Medical History    Past Medical History:  Diagnosis Date  . Arthritis    "probably in his legs" (08/15/2016)  . Blind right eye   . BPH (benign prostatic hyperplasia)   . Breast asymmetry    Left breast is larger, present for several years.  [name] CAD (coronary artery disease)    a. Cath in the late 90's - reportedly ok;  b. 2014 s/p stenting x 2 @ [facility]; c 08/19/16 Cath/PCI with DES -> RCA, plan to treat LM 70% medically. Seen by surgery and felt to be too high risk for CABG.  . Chronic combined systolic (congestive) and diastolic (congestive) heart failure ([place])    a. Previously reduced EF-->50% by echo in 2012;  b. 06/2015 Echo: EF 50-55%  c. 07/2016 Echo: EF 45-50%; d. 12/2017 Echo: EF 55%, Gr1 DD, mild MR.  . Chronic kidney disease (CKD), stage IV (severe) ([place])   . CML (chronic myelocytic leukemia) ([place])   . GERD (gastroesophageal reflux disease)   . GIB (gastrointestinal bleeding)    a. 12/2017 3 unit PRBC GIB in setting of coumadin-->Endo/colon multiple duodenal ulcers and a single bleeding ulcer in the proximal ascending colon status post hemostatic clipping x2.  . Gout   . Hyperlipemia   . Hypertension   . Hypothyroidism   . Iron deficiency anemia   . Migraine    "in the 1960s" (08/15/2016)  . NICM (nonischemic cardiomyopathy) ([place])    a.  Previously reduced EF-->50% by echo in 2012;  b. 06/2015 Echo: EF 50-55%, Gr2 DD, mild MR, mildly dil LA, Ao sclerosis, mild TR;  c. 07/2016 Echo: EF 45-50%; d. 12/2017 Echo: EF 55%, Gr1 DD, mild MR.  [name] PAF (paroxysmal atrial fibrillation) ([facility])    a. ? Dx 2014-->s/p DCCV;  b. CHA2DS2VASc = 6--> Coumadin.  . Prostate cancer ([place])   . RBBB   . Type II diabetes mellitus ([place])    Past Surgical History:  Procedure Laterality Date  . CATARACT EXTRACTION W/ INTRAOCULAR LENS  IMPLANT, BILATERAL Bilateral   . COLONOSCOPY N/A 01/13/2018   Procedure: COLONOSCOPY;  Surgeon: Lucas Landsman, MD;  Location: [facility] ENDOSCOPY;  Service: Gastroenterology;  Laterality: N/A;  . COLONOSCOPY WITH PROPOFOL N/A 01/01/2018   Procedure: COLONOSCOPY WITH PROPOFOL;  Surgeon: Lucas Landsman, MD;  Location: [facility] ENDOSCOPY;  Service: Gastroenterology;  Laterality: N/A;  . CORONARY ANGIOPLASTY WITH STENT PLACEMENT  09/2012   2 stents  . CORONARY STENT INTERVENTION N/A 08/19/2016   Procedure: Coronary Stent Intervention;  Surgeon: Lucas Bush, MD;  Location: [place] CV LAB;  Service: Cardiovascular;  Laterality: N/A;  . ESOPHAGEAL MANOMETRY N/A 12/16/2017   Procedure: ESOPHAGEAL MANOMETRY (EM);  Surgeon: Lucas Landsman, MD;  Location: [facility] ENDOSCOPY;  Service: Gastroenterology;  Laterality: N/A;  . ESOPHAGOGASTRODUODENOSCOPY N/A 12/20/2016   Procedure: ESOPHAGOGASTRODUODENOSCOPY (EGD);  Surgeon: Lucas Landsman, MD;  Location: [facility] ENDOSCOPY;  Service: Gastroenterology;  Laterality:  N/A;  . ESOPHAGOGASTRODUODENOSCOPY N/A 01/13/2018   Procedure: ESOPHAGOGASTRODUODENOSCOPY (EGD);  Surgeon: Lucas Landsman, MD;  Location: [facility] ENDOSCOPY;  Service: Gastroenterology;  Laterality: N/A;  . ESOPHAGOGASTRODUODENOSCOPY (EGD) WITH PROPOFOL N/A 11/03/2017   Procedure: ESOPHAGOGASTRODUODENOSCOPY (EGD) WITH PROPOFOL;  Surgeon: Lucas Landsman, MD;  Location: [facility] ENDOSCOPY;  Service: Gastroenterology;   Laterality: N/A;  . EYE SURGERY    . HERNIA REPAIR     "navel"  . LEFT HEART CATH AND CORONARY ANGIOGRAPHY N/A 08/14/2016   Procedure: Left Heart Cath and Coronary Angiography;  Surgeon: Lucas Bush, MD;  Location: [place] CV LAB;  Service: Cardiovascular;  Laterality: N/A;  . TOE AMPUTATION Right    "big toe"    Allergies  Allergies  Allergen Reactions  . Clopidogrel Anaphylaxis    Other reaction(s): ANAPHYLAXIS;altered mental status;  (electrolytes "out of wack" and confusion per family; THEY DO NOT REMEMBER ANY OTHER REACTION)- MSB 10/10/15  . Ciprofloxacin Itching  . Mirtazapine Diarrhea    Bad dreams Bad dreams  . Spironolactone     Other reaction(s): Other (See Comments) gynecomastia    History of Present Illness    85 year old male with above complex past medical history including CAD, mixed ischemic and nonischemic cardiomyopathy, HFrEF, hypertension, hyperlipidemia, type 2 diabetes mellitus, paroxysmal atrial fibrillation, chronic kidney disease stage III-IV, CML, iron deficiency anemia, GI bleed, BPH, and hypothyroidism.  He previously underwent stenting of the right coronary artery in June 2018 and is known to have residual 70% left main disease.  He was evaluated by CT surgery at that time and was not felt to be a suitable surgical candidate.  In that setting, he has been medically managed.  He does have a history of LV dysfunction with an EF of 45 to 50% by echo in May 2018 the more recent echo in October 2019 showed an EF of 55% with grade 1 diastolic dysfunction.  It was in October 2019 that he was admitted with GI bleeding in the setting of chronic Coumadin and was found to have multiple duodenal ulcer as well as oozing from a recently treated colonic angiectasia.  After outpatient follow-up with GI, Coumadin was resumed in November 2019.  [name] was last seen in clinic in December 2019, at which time he reported lower extremity pain and a sense that his  legs were feeling cold.  This was most pronounced at night.  He underwent ABIs which were stable compared to previous recordings.  He says that since his last visit, he is continued to have somewhat constant thigh discomfort.  He also notes arthritic pain of his hips and knees which is more positional in nature.  His activity has been less in the setting of this pain and as result, he feels unsteady on his feet at times.  His weight has been trending about 203 pounds on his home scale which is 3 to 4 pounds higher than what he had been running previously.  He has been compliant with torsemide and careful with his salt intake.  He has not had any significant lower extremity swelling or increase in abdominal girth.  He does have chronic dyspnea on exertion which he thinks may be getting a little bit worse.  He has not been having any chest pain, PND, orthopnea, dizziness, syncope, edema, or early satiety.  He occasionally notes a brief flutter in his chest.  Home Medications    Prior to Admission medications   Medication Sig Start Date End Date Taking? Authorizing Provider  acetaminophen (TYLENOL) 500 MG tablet Take 500 mg by mouth every 6 (six) hours as needed.   Yes [provider]  albuterol (PROVENTIL HFA;VENTOLIN HFA) 108 (90 Base) MCG/ACT inhaler Inhale 2 puffs into the lungs every 6 (six) hours as needed for wheezing or shortness of breath. 05/13/17  Yes Theora Gianotti, NP  Alcohol Swabs 70 % PADS Use when checking blood sugar up to 3x daily 04/09/17  Yes Karamalegos, [name], DO  allopurinol (ZYLOPRIM) 100 MG tablet Take 1 tablet (100 mg total) by mouth daily. 03/31/18  Yes Karamalegos, [name], DO  atorvastatin (LIPITOR) 80 MG tablet TAKE ONE TABLET BY MOUTH ONCE DAILY 03/31/18  Yes Karamalegos, [name], DO  bosutinib (BOSULIF) 100 MG tablet Take 200 mg by mouth at bedtime. Take with food.    Yes [provider]  Dulaglutide (TRULICITY) 3.32 RJ/1.8AC SOPN  Inject 0.75 mg into the skin once a week. 10/31/17  Yes Karamalegos, Alexander J, DO  enalapril (VASOTEC) 5 MG tablet Take 1 tablet (5 mg total) by mouth daily. 08/06/17  Yes Karamalegos, Alexander J, DO  fluticasone furoate-vilanterol (BREO ELLIPTA) 100-25 MCG/INH AEPB Inhale 1 puff into the lungs daily. 06/13/17  Yes Karamalegos, Alexander J, DO  gabapentin (NEURONTIN) 300 MG capsule TAKE TWO CAPSULES BY MOUTH EVERY MORNING, ONE CAP IN AFTERNOON, & 2 CAPS AT BEDTIME. IN FUTURE INCREASE UP TO MAX OF 3 CAPS 3 TIMES A DAY 02/10/18  Yes Karamalegos, [name], DO  Insulin Detemir (LEVEMIR FLEXTOUCH) 100 UNIT/ML Pen Inject 60 Units into the skin daily. 02/03/18  Yes Karamalegos, [name], DO  Insulin Pen Needle 31G X 5 MM MISC 31 g by Does not apply route as directed. 04/16/17  Yes [name], NP  levothyroxine (SYNTHROID, LEVOTHROID) 25 MCG tablet Take 1 tablet (25 mcg total) by mouth daily before breakfast. 03/31/18  Yes Karamalegos, [name], DO  loperamide (IMODIUM A-D) 2 MG tablet Take 2 mg by mouth 3 (three) times daily as needed for diarrhea or loose stools.    Yes [provider]  metoprolol succinate (TOPROL XL) 25 MG 24 hr tablet Take 1 tablet (25 mg total) by mouth daily. 03/06/18  Yes End, [name], MD  Multiple Vitamins-Minerals (MULTIVITAMIN ADULTS 50+ PO) Take 1 tablet by mouth every morning.   Yes [provider]  nepafenac (NEVANAC) 0.1 % ophthalmic suspension Place 1 drop into both eyes 3 (three) times daily as needed.   Yes [provider]  nitroGLYCERIN (NITROSTAT) 0.4 MG SL tablet Place 1 tablet (0.4 mg total) under the tongue every 5 (five) minutes x 3 doses as needed for chest pain. 10/02/16  Yes End, [name], MD  omeprazole (PRILOSEC) 40 MG capsule Take 1 capsule (40 mg total) by mouth 2 (two) times daily. 02/05/18 05/06/18 Yes Vanga, Tally Due, MD  Spacer/Aero-Holding Chambers [facility] ADVANTAGE) [place] 1 each by Other route once.  11/04/14  Yes Ahmed Prima, MD  torsemide (DEMADEX) 20 MG tablet Take 20 mg by mouth 2 (two) times daily.    Yes [provider]  warfarin (COUMADIN) 3 MG tablet Take 1 tablet (3 mg total) by mouth daily. Take as directed by the coumadin clinic. 04/17/18  Yes End, [name], MD    Review of Systems    Chronic dyspnea on exertion as outlined above.  Occasional brief palpitations.  No chest pain, PND, orthopnea, dizziness, syncope, edema, or early satiety.  All other systems reviewed and are otherwise negative except as noted above.  Physical Exam    VS:  BP 130/80 (BP Location: Right Arm, Patient Position: Sitting, Cuff Size: Normal)   Pulse 90   Ht 5\' 4"  (1.626 m)   Wt 207 lb (93.9 kg)   BMI 35.53 kg/m  , BMI Body mass index is 35.53 kg/m. GEN: Well nourished, well developed, in no acute distress. HEENT: normal. Neck: Supple, no JVD, carotid bruits, or masses. Cardiac: RRR, no murmurs, rubs, or gallops. No clubbing, cyanosis, edema.  Radials/PT 2+ and equal bilaterally.  Respiratory:  Respirations regular and unlabored, clear to auscultation bilaterally. GI: Obese, soft, nontender, nondistended, BS + x 4. MS: no deformity or atrophy. Skin: warm and dry, no rash. Neuro:  Strength and sensation are intact. Psych: Normal affect.  Accessory Clinical Findings    ECG personally reviewed by me today -regular sinus rhythm, 90, left axis deviation, left anterior fascicular block, right bundle branch block.  His PR interval is 192 ms.  No evidence of high-grade heart block- no acute changes.  Assessment & Plan    1.  Coronary artery disease: Status post RCA stenting 2018 with known residual 70% left main disease.  He has never experienced chest pain but does have chronic dyspnea on exertion.  Given comorbidities, is unclear to what extent dyspnea may be secondary to coronary disease.  He remains on beta-blocker ACE inhibitor and statin therapy.  He now has a several month  history of myalgias and in that setting, I am going to hold his statin for 2 weeks.  2.  Dyspnea on exertion/HFrEF: He notes somewhat persistent dyspnea on exertion which might be a little bit worse than his last visit.  He has not been having chest pain.  [name] euvolemic on exam.  He remains on beta-blocker, ACE inhibitor, and torsemide therapy.  I am going to follow-up a complete metabolic panel, CBC, and BNP today.  If renal function stable and BNP elevated, I will likely ask him to take a slightly higher dose of torsemide for a few days.  We discussed the importance of daily weights, sodium restriction, medication compliance, and symptom reporting and he verbalizes understanding.   3.  Paroxysmal atrial fibrillation: In sinus today.  Beta-blocker dose previously reduced in the setting of trifascicular block.  Anticoagulated with Coumadin.  Following up a CBC today in the setting of dyspnea.  4.  Bifascicular block: Right bundle branch block and left anterior fascicular block.  PR interval just barely under 200 ms on today's ECG.  Metoprolol reduced at last visit.  No presyncope or syncope.  No changes today.  5.  Leg pain/myalgias: Hold Lipitor for 2 weeks.  Follow-up complete metabolic panel and CK today.  I told him that if symptoms resolve off of statin, he should contact us at which point, we would add back a low-dose of rosuvastatin.  If symptoms do not resolve, I recommend he just resume atorvastatin.  He did have ABIs recently which were stable.  5.  Stage III-IV chronic kidney disease: Following up metabolic panel today.  6.  Type 2 diabetes mellitus: A1c was 10.3 in August 2019.  He remains on insulin therapy and is followed by primary care.  7.  History of GI bleed/duodenal ulcers: Follow-up CBC today in the setting of dyspnea.  Continue PPI.  8.  Disposition: Follow-up CBC, Cmet, CK, and BNP today.  If labs stable, follow-up in 2 to 3 months.  If abnormality noted requiring med  changes, would likely plan follow-up in 3  to 4 weeks.  [name], NP 04/22/2018, 4:11 PM

## 2018-04-22 NOTE — Patient Instructions (Signed)
Please continue dosage of 1 TABLET EVERY DAY EXCEPT 1.5 TABLETS ON MONDAYS & FRIDAYS. Recheck INR in 5 weeks.

## 2018-04-22 NOTE — Patient Instructions (Signed)
Medication Instructions:  Your physician recommends that you continue on your current medications as directed. Please refer to the Current Medication list given to you today.  If you need a refill on your cardiac medications before your next appointment, please call your pharmacy.   Lab work: Your physician recommends that you return for lab work today (CBC, CMET, CK, BNP)  If you have labs (blood work) drawn today and your tests are completely normal, you will receive your results only by: [name] MyChart Message (if you have MyChart) OR . A paper copy in the mail If you have any lab test that is abnormal or we need to change your treatment, we will call you to review the results.  Testing/Procedures: None ordered   Follow-Up: At [facility], you and your health needs are our priority.  As part of our continuing mission to provide you with exceptional heart care, we have created designated Provider Care Teams.  These Care Teams include your primary Cardiologist (physician) and Advanced Practice Providers (APPs -  Physician Assistants and Nurse Practitioners) who all work together to provide you with the care you need, when you need it. You will need a follow up appointment in 2 months. You may see [name], MD.

## 2018-04-23 LAB — COMPREHENSIVE METABOLIC PANEL
ALT: 10 IU/L (ref 0–44)
AST: 21 IU/L (ref 0–40)
Albumin/Globulin Ratio: 1.3 (ref 1.2–2.2)
Albumin: 4 g/dL (ref 3.6–4.6)
Alkaline Phosphatase: 105 IU/L (ref 39–117)
BUN / CREAT RATIO: 14 (ref 10–24)
BUN: 19 mg/dL (ref 8–27)
Bilirubin Total: <0.2 mg/dL (ref 0.0–1.2)
CALCIUM: 9.4 mg/dL (ref 8.6–10.2)
CO2: 21 mmol/L (ref 20–29)
Chloride: 99 mmol/L (ref 96–106)
Creatinine, Ser: 1.33 mg/dL — ABNORMAL HIGH (ref 0.76–1.27)
GFR calc Af Amer: 57 mL/min/{1.73_m2} — ABNORMAL LOW (ref >59)
GFR calc non Af Amer: 49 mL/min/{1.73_m2} — ABNORMAL LOW (ref >59)
Globulin, Total: 3.2 g/dL (ref 1.5–4.5)
Glucose: 214 mg/dL — ABNORMAL HIGH (ref 65–99)
Potassium: 3.7 mmol/L (ref 3.5–5.2)
Sodium: 140 mmol/L (ref 134–144)
Total Protein: 7.2 g/dL (ref 6.0–8.5)

## 2018-04-23 LAB — CK: Total CK: 125 U/L (ref 24–204)

## 2018-04-23 LAB — CBC
Hematocrit: 35.4 % — ABNORMAL LOW (ref 37.5–51.0)
Hemoglobin: 11.4 g/dL — ABNORMAL LOW (ref 13.0–17.7)
MCH: 26.4 pg — ABNORMAL LOW (ref 26.6–33.0)
MCHC: 32.2 g/dL (ref 31.5–35.7)
MCV: 82 fL (ref 79–97)
Platelets: 222 10*3/uL (ref 150–450)
RBC: 4.32 x10E6/uL (ref 4.14–5.80)
RDW: 13.9 % (ref 11.6–15.4)
WBC: 8.6 10*3/uL (ref 3.4–10.8)

## 2018-04-24 ENCOUNTER — Other Ambulatory Visit: Payer: Self-pay | Admitting: Family Medicine

## 2018-04-24 ENCOUNTER — Telehealth: Payer: Self-pay

## 2018-04-24 DIAGNOSIS — E1142 Type 2 diabetes mellitus with diabetic polyneuropathy: Secondary | ICD-10-CM

## 2018-04-24 NOTE — Telephone Encounter (Signed)
Call attempted. No answer, no VM available.

## 2018-04-24 NOTE — Telephone Encounter (Signed)
-----   Message from Theora Gianotti, NP sent at 04/23/2018  4:09 PM EST ----- Blood counts, electrolytes, CK (muscle enzyme), liver enzymes ok.  Kidney's stable. Awaiting BNP.

## 2018-04-27 LAB — BRAIN NATRIURETIC PEPTIDE: BNP: 52 pg/mL (ref 0.0–100.0)

## 2018-04-27 NOTE — Telephone Encounter (Signed)
[name], ok per DPR, given results and she verbalized understanding.Will let her know if BNP is abnormal once it results.

## 2018-04-27 NOTE — Telephone Encounter (Signed)
Called [facility] to see about BNP. After being on hold for about 10 minutes, they found they had received the lavender tube with patient's label but it was not labeled "EDTA Plasma." They need it to be labeled as that. They are able to still perform the BNP and it should be available by the end of the day.  Information about labeling discussed with CMA's here in the office.

## 2018-04-28 ENCOUNTER — Telehealth: Payer: Self-pay | Admitting: *Deleted

## 2018-04-28 MED ORDER — ISOSORBIDE MONONITRATE ER 30 MG PO TB24: 15.0000 mg | ORAL_TABLET | Freq: Every day | ORAL | 3 refills | Status: DC

## 2018-04-28 NOTE — Telephone Encounter (Signed)
-----   Message from Theora Gianotti, NP sent at 04/28/2018  7:33 AM EST ----- bnp wnl, suggesting that volume stable (volume looked good on exam also).  I suspect some of his dyspnea may be 2/2 his coronary dzs.  Pls add imdur 15 mg daily.  Keep f/u with Dr. Saunders Revel in April.  He should notify us sooner if he has worsening of symptoms or develops headaches on imdur.

## 2018-04-28 NOTE — Telephone Encounter (Signed)
Spoke with patient and wife. They verbalized understanding to start Imdur 15 mg (0.5 tablet) by mouth once a day and keep f/u appointment. They will let us know if symptoms worsen. Rx sent to pharmacy.

## 2018-04-30 ENCOUNTER — Ambulatory Visit: Payer: Medicare HMO | Admitting: Gastroenterology

## 2018-05-06 ENCOUNTER — Encounter: Payer: Self-pay | Admitting: Family Medicine

## 2018-05-06 ENCOUNTER — Other Ambulatory Visit: Payer: Self-pay

## 2018-05-06 ENCOUNTER — Ambulatory Visit (INDEPENDENT_AMBULATORY_CARE_PROVIDER_SITE_OTHER): Payer: Medicare HMO | Admitting: Family Medicine

## 2018-05-06 VITALS — BP 132/76 | HR 88 | Temp 98.3°F | Resp 16 | Ht 64.0 in | Wt 198.0 lb

## 2018-05-06 DIAGNOSIS — J432 Centrilobular emphysema: Secondary | ICD-10-CM

## 2018-05-06 DIAGNOSIS — E1121 Type 2 diabetes mellitus with diabetic nephropathy: Secondary | ICD-10-CM | POA: Diagnosis not present

## 2018-05-06 DIAGNOSIS — E1169 Type 2 diabetes mellitus with other specified complication: Secondary | ICD-10-CM | POA: Diagnosis not present

## 2018-05-06 DIAGNOSIS — E1165 Type 2 diabetes mellitus with hyperglycemia: Secondary | ICD-10-CM | POA: Diagnosis not present

## 2018-05-06 DIAGNOSIS — E785 Hyperlipidemia, unspecified: Secondary | ICD-10-CM

## 2018-05-06 DIAGNOSIS — E1142 Type 2 diabetes mellitus with diabetic polyneuropathy: Secondary | ICD-10-CM

## 2018-05-06 DIAGNOSIS — IMO0002 Reserved for concepts with insufficient information to code with codable children: Secondary | ICD-10-CM

## 2018-05-06 LAB — POCT GLYCOSYLATED HEMOGLOBIN (HGB A1C): Hemoglobin A1C: 8.7 % — AB (ref 4.0–5.6)

## 2018-05-06 NOTE — Progress Notes (Signed)
Subjective:    Patient ID: MALLORY ENRIQUES, male    DOB: Aug 27, 1933, 85 y.o.   MRN: [id number]  LEALAND ELTING is a 85 y.o. male presenting on 05/06/2018 for Generalized Body Aches (onset month but has runny nose 2-3 days, chills, ear clogged up, did not check for fever but denies sore throat, night dry cough intermitten)   HPI   CHRONIC DM, Type 2/ CKD-III Nephropathyand Neuropathy Due for A1c today He believes sugar improved now back on meds Meds: - Trulicity 0.75mg  weekly inj Saturday (now has supply of med after app for med assistance), Levemir 60u daily Reports good compliance. Tolerating well w/o side-effects Currently on ACEi Lifestyle: - Diet (still trying to improve diet, but not always adhering to low carb diet, drinks some water) - Exercise (limited by dyspnea) - Admits chronicDM Neuropathywith pain in feet bilateral,on Gabapentin with improvement, taking 300mg  x 2 pills twice a day - Denies hypoglycemia, nausea vomiting, fever chills, swelling, polyuria  Hyperlipidemia On statin, his cardiologist has taken him off due to concern of statin induced myalgia, atorvastatin 80mg  daily, he was having aches in body arms and legs, seems slightly improved now off med but still has some symptoms. Asking if it will keep improving.  Centrilobular Emphysema (COPD) No recent flare up. He complains of some cough and congestion seems improved or recent viral URI. Denies flu like symptoms, says body ache present month or more before recent cough symptoms.   Depression screen [facility] 2/9 05/06/2018 03/17/2018 12/31/2017  Decreased Interest 0 0 0  Down, Depressed, Hopeless 0 0 0  PHQ - 2 Score 0 0 0    Social History   Tobacco Use  . Smoking status: Former Smoker    Packs/day: 2.00    Years: 15.00    Pack years: 30.00    Types: Cigarettes  . Smokeless tobacco: Former Systems developer  . Tobacco comment: quit 1979  Substance Use Topics  . Alcohol use: No    Comment: previously drank  heavily - quit 1979.  . Drug use: No    Review of Systems Per HPI unless specifically indicated above     Objective:    BP 132/76   Pulse 88   Temp 98.3 F (36.8 C) (Oral)   Resp 16   Ht 5\' 4"  (1.626 m)   Wt 198 lb (89.8 kg)   SpO2 99%   BMI 33.99 kg/m   Wt Readings from Last 3 Encounters:  05/06/18 198 lb (89.8 kg)  04/22/18 207 lb (93.9 kg)  03/17/18 199 lb (90.3 kg)    Physical Exam Vitals signs and nursing note reviewed.  Constitutional:      General: [name] not in acute distress.    Appearance: [name] well-developed. [name] not diaphoretic.     Comments: Well-appearing, comfortable, cooperative  HENT:     Head: Normocephalic and atraumatic.  Eyes:     General:        Right eye: No discharge.        Left eye: No discharge.     Conjunctiva/sclera: Conjunctivae normal.  Neck:     Musculoskeletal: Normal range of motion and neck supple.     Thyroid: No thyromegaly.  Cardiovascular:     Rate and Rhythm: Normal rate and regular rhythm.     Heart sounds: Normal heart sounds. No murmur.  Pulmonary:     Effort: Pulmonary effort is normal. No respiratory distress.     Breath sounds: Normal breath sounds.  No wheezing or rales.     Comments: Good air movement Musculoskeletal: Normal range of motion.  Lymphadenopathy:     Cervical: No cervical adenopathy.  Skin:    General: Skin is warm and dry.     Findings: No erythema or rash.  Neurological:     Mental Status: [name] alert and oriented to person, place, and time.  Psychiatric:        Behavior: Behavior normal.     Comments: Well groomed, good eye contact, normal speech and thoughts    Results for orders placed or performed in visit on 05/06/18  POCT glycosylated hemoglobin (Hb A1C)  Result Value Ref Range   Hemoglobin A1C 8.7 (A) 4.0 - 5.6 %   Recent Labs    07/22/17 1630 10/24/17 1616 05/06/18 1433  HGBA1C 9.9* 10.3* 8.7*      Assessment & Plan:   Problem List Items Addressed This Visit     Centrilobular emphysema ([place])    Stable currently without evidence of acute COPD exac On Breo maintenance, albuterol PRN Follow-up PRN if worsening      Diabetic neuropathy associated with type 2 diabetes mellitus ([place])    Some refractory neuropathy symptoms  May take extra gabapentin pill in PM if needed      Hyperlipidemia associated with type 2 diabetes mellitus ([place])    Recently DC'd off statin due to myalgia generalized, intolerance at atorva 80  Recommend return to cardiology in future reconsider lower dose statin if tolerated      Uncontrolled type 2 diabetes mellitus with nephropathy ([place]) - Primary    Improved control still elevated A1c 8.7, previously 9.9 to 10.3 Now back on meds Complications - hypoglycemia (improved), CKD-III, peripheral neuropathy, cataracts, CAD  Plan:  1. Discussed DM management concern with CKD and risk of hypoglycemia - limited med options due to CKD - CONTINUE Trulicity 0.75mg  weekly, Levemir 60u daily - Needs to bring CBG readings to office to better titrate insulin, otherwise may recommend future referral to Endocrine for additional monitoring - Counseling on risk of hypoglycemia and symptoms 2. Encourage improved lifestyle - keep improving diet - limit carbs, limit sweet tea, inc water - he has poor health literacy on diabetes Follow-up 3 months for DM A1c, med adjust  Future reconsider dose adjust on GLP1 trulicity up to 1.5 dose if needed, caution with some side effect nausea now      Relevant Orders   POCT glycosylated hemoglobin (Hb A1C) (Completed)      No orders of the defined types were placed in this encounter.    Follow up plan: Return in about 3 months (around 08/04/2018) for DM A1c.   [name], DO [facility] 05/06/2018, 2:08 PM

## 2018-05-06 NOTE — Patient Instructions (Addendum)
Thank you for coming to the office today.  Recent Labs    07/22/17 1630 10/24/17 1616 05/06/18 1433  HGBA1C 9.9* 10.3* 8.7*   Continue Trulicity 6.33 weekly  Continue Levemir insulin 60 units  Keep track of sugars bring in next time  Remain off Atorvastatin aches and pains shoulder improve  May take additional pill gabapentin if needed  Please schedule a Follow-up Appointment to: Return in about 3 months (around 08/04/2018) for DM A1c.  If you have any other questions or concerns, please feel free to call the office or send a message through [place]. You may also schedule an earlier appointment if necessary.  Additionally, you may be receiving a survey about your experience at our office within a few days to 1 week by e-mail or mail. We value your feedback.  [name], DO [place]

## 2018-05-07 ENCOUNTER — Other Ambulatory Visit: Admit: 2018-05-07 | Discharge: 2018-05-08 | Payer: MEDICARE

## 2018-05-07 ENCOUNTER — Ambulatory Visit: Admit: 2018-05-07 | Discharge: 2018-05-08 | Payer: MEDICARE

## 2018-05-07 DIAGNOSIS — C9210 Chronic myeloid leukemia, BCR/ABL-positive, not having achieved remission: Principal | ICD-10-CM

## 2018-05-07 NOTE — Assessment & Plan Note (Signed)
Recently DC'd off statin due to myalgia generalized, intolerance at atorva 80  Recommend return to cardiology in future reconsider lower dose statin if tolerated

## 2018-05-07 NOTE — Assessment & Plan Note (Signed)
Stable currently without evidence of acute COPD exac On Breo maintenance, albuterol PRN Follow-up PRN if worsening

## 2018-05-07 NOTE — Assessment & Plan Note (Signed)
Improved control still elevated A1c 8.7, previously 9.9 to 10.3 Now back on meds Complications - hypoglycemia (improved), CKD-III, peripheral neuropathy, cataracts, CAD  Plan:  1. Discussed DM management concern with CKD and risk of hypoglycemia - limited med options due to CKD - CONTINUE Trulicity 0.75mg  weekly, Levemir 60u daily - Needs to bring CBG readings to office to better titrate insulin, otherwise may recommend future referral to Endocrine for additional monitoring - Counseling on risk of hypoglycemia and symptoms 2. Encourage improved lifestyle - keep improving diet - limit carbs, limit sweet tea, inc water - he has poor health literacy on diabetes Follow-up 3 months for DM A1c, med adjust  Future reconsider dose adjust on GLP1 trulicity up to 1.5 dose if needed, caution with some side effect nausea now

## 2018-05-07 NOTE — Assessment & Plan Note (Signed)
Some refractory neuropathy symptoms  May take extra gabapentin pill in PM if needed

## 2018-05-13 ENCOUNTER — Telehealth: Payer: Self-pay | Admitting: Family Medicine

## 2018-05-13 ENCOUNTER — Telehealth: Payer: Self-pay | Admitting: Gastroenterology

## 2018-05-13 ENCOUNTER — Telehealth: Payer: Self-pay | Admitting: Internal Medicine

## 2018-05-13 NOTE — Telephone Encounter (Signed)
Wife called requesting refill on gabapentin called into [facility]

## 2018-05-13 NOTE — Telephone Encounter (Signed)
*  STAT* If patient is at the pharmacy, call can be transferred to refill team.   1. Which medications need to be refilled? (please list name of each medication and dose if known) omeprazole (PRILOSEC) 40 MG capsule(Expired)  2. Which pharmacy/location (including street and city if local pharmacy) is medication to be sent to? [place]  3. Do they need a 30 day or 90 day supply? 90 day

## 2018-05-13 NOTE — Telephone Encounter (Signed)
ERROR

## 2018-05-13 NOTE — Telephone Encounter (Signed)
Pt wife is calling to get refill on rx Ametrozle 40 mg to [facility] # [phone] she also wants to make sure Dr. Marius Ditch still wants him on this rx

## 2018-05-14 ENCOUNTER — Other Ambulatory Visit: Payer: Self-pay | Admitting: Gastroenterology

## 2018-05-14 DIAGNOSIS — K219 Gastro-esophageal reflux disease without esophagitis: Secondary | ICD-10-CM

## 2018-05-14 NOTE — Telephone Encounter (Signed)
Patient's Rx already send on 02/07 she just need to call pharmacy, called several attempts but VM not set up.

## 2018-05-14 NOTE — Telephone Encounter (Signed)
Medication has been refilled and sent to pharmacy  

## 2018-05-14 NOTE — Telephone Encounter (Signed)
Patients wife notified

## 2018-05-25 ENCOUNTER — Other Ambulatory Visit: Payer: Self-pay

## 2018-05-25 MED ORDER — TORSEMIDE 20 MG PO TABS: 20.0000 mg | ORAL_TABLET | Freq: Two times a day (BID) | ORAL | 0 refills | Status: DC

## 2018-05-27 ENCOUNTER — Other Ambulatory Visit: Payer: Self-pay

## 2018-05-27 ENCOUNTER — Ambulatory Visit (INDEPENDENT_AMBULATORY_CARE_PROVIDER_SITE_OTHER): Payer: Medicare HMO

## 2018-05-27 DIAGNOSIS — Z7901 Long term (current) use of anticoagulants: Secondary | ICD-10-CM

## 2018-05-27 DIAGNOSIS — I480 Paroxysmal atrial fibrillation: Secondary | ICD-10-CM

## 2018-05-27 LAB — POCT INR: INR: 2.3 (ref 2.0–3.0)

## 2018-05-27 MED ORDER — EASY TOUCH 31 GAUGE X 5/16" NEEDLE (8 MM)
1 refills | 0 days | Status: CP
Start: 2018-05-27 — End: ?

## 2018-05-27 NOTE — Patient Instructions (Signed)
Please continue dosage of 1 TABLET EVERY DAY EXCEPT 1.5 TABLETS ON MONDAYS & FRIDAYS. Recheck INR in 6 weeks.

## 2018-06-01 ENCOUNTER — Ambulatory Visit (INDEPENDENT_AMBULATORY_CARE_PROVIDER_SITE_OTHER): Payer: Medicare HMO | Admitting: Family Medicine

## 2018-06-01 ENCOUNTER — Telehealth: Payer: Self-pay | Admitting: Internal Medicine

## 2018-06-01 ENCOUNTER — Other Ambulatory Visit: Payer: Self-pay

## 2018-06-01 ENCOUNTER — Ambulatory Visit
Admission: RE | Admit: 2018-06-01 | Discharge: 2018-06-01 | Disposition: A | Discharge status: Home / Self Care | Payer: Medicare HMO | Source: Ambulatory Visit | Attending: Family Medicine | Admitting: Family Medicine

## 2018-06-01 ENCOUNTER — Encounter: Payer: Self-pay | Admitting: Family Medicine

## 2018-06-01 VITALS — BP 136/75 | HR 79 | Temp 98.0°F | Resp 16 | Ht 64.0 in | Wt 204.0 lb

## 2018-06-01 DIAGNOSIS — R29898 Other symptoms and signs involving the musculoskeletal system: Secondary | ICD-10-CM

## 2018-06-01 DIAGNOSIS — R531 Weakness: Secondary | ICD-10-CM

## 2018-06-01 DIAGNOSIS — M79605 Pain in left leg: Secondary | ICD-10-CM

## 2018-06-01 DIAGNOSIS — C9211 Chronic myeloid leukemia, BCR/ABL-positive, in remission: Secondary | ICD-10-CM

## 2018-06-01 DIAGNOSIS — M159 Polyosteoarthritis, unspecified: Secondary | ICD-10-CM

## 2018-06-01 DIAGNOSIS — M79604 Pain in right leg: Secondary | ICD-10-CM

## 2018-06-01 DIAGNOSIS — M170 Bilateral primary osteoarthritis of knee: Secondary | ICD-10-CM | POA: Diagnosis not present

## 2018-06-01 DIAGNOSIS — M25551 Pain in right hip: Secondary | ICD-10-CM

## 2018-06-01 DIAGNOSIS — M150 Primary generalized (osteo)arthritis: Secondary | ICD-10-CM

## 2018-06-01 NOTE — Telephone Encounter (Signed)
Pt c/o Shortness Of Breath: STAT if SOB developed within the last 24 hours or pt is noticeably SOB on the phone  1. Are you currently SOB (can you hear that pt is SOB on the phone)? yes  2. How long have you been experiencing SOB? A month  3. Are you SOB when sitting or when up moving around? All the time  4. Are you currently experiencing any other symptoms? Sob, pt wife states pt is currently at PCP

## 2018-06-01 NOTE — Telephone Encounter (Signed)
Call placed to the patient. He stated that he is still having dyspnea on exertion and it has gotten to the point where its hard for him to bend over and tie his shoe without getting out of breath.   He stated that he went to the PCP today. Weight was 204 which he stated that it was his normal. He denies lower extremity edema. He currently takes Torsemide 20 mg bid which he stated he has been taking.  [name] watching his sodium intake but does not weigh himself daily.   He stated that he wanted to talk to Dr. Saunders Revel about a "fibrillator" to help with the shortness of breath?

## 2018-06-01 NOTE — Telephone Encounter (Signed)
Attempted to reach Mr. Milligan and his wife at numbers listed in [place].  I did not receive an answer at either number and will try to reach them again tomorrow.  If dyspnea worsens in the meantime, Mr. Mathey should go to the ED for further evaluation, though hopefully PCP evaluation today will offer some insight as well.  [name], MD [facility] Pager: [phone]

## 2018-06-01 NOTE — Telephone Encounter (Signed)
Left a message for the patient that Dr. Saunders Revel had tried to call and will try again tomorrow but in the meantime if his dyspnea gets worse then he should go to the ED.

## 2018-06-01 NOTE — Progress Notes (Signed)
Subjective:    Patient ID: Lucas Caldwell, male    DOB: Aug 29, 1933, 85 y.o.   MRN: [id number]  Lucas Caldwell is a 85 y.o. male presenting on 06/01/2018 for Leg Pain (Right leg is worst as compare to Left but bilateral pain onset month but getting worst)  Accompanied by wife, [place], for additional history.  HPI   Bilateral Lower Extremity Pain / Weakness Reports persistent problem now gradual worsening bilateral lower extremity weakness and pain, limited with standing and ambulation at times. He has been less active recently, no injury or new problem - Taking Tylenol Ext Str 500mg  1-3 per dose about 2-3 times a day, with mild relief - Cannot take NSAID due to CKD - Denies numbness tingling swelling, redness, dyspnea, chest pain, near syncope, fall   Depression screen [facility] 2/9 06/01/2018 05/06/2018 03/17/2018  Decreased Interest 0 0 0  Down, Depressed, Hopeless 0 0 0  PHQ - 2 Score 0 0 0    Social History   Tobacco Use  . Smoking status: Former Smoker    Packs/day: 2.00    Years: 15.00    Pack years: 30.00    Types: Cigarettes  . Smokeless tobacco: Former Systems developer  . Tobacco comment: quit 1979  Substance Use Topics  . Alcohol use: No    Comment: previously drank heavily - quit 1979.  . Drug use: No    Review of Systems Per HPI unless specifically indicated above     Objective:    BP 136/75   Pulse 79   Temp 98 F (36.7 C) (Oral)   Resp 16   Ht 5\' 4"  (1.626 m)   Wt 204 lb (92.5 kg)   BMI 35.02 kg/m   Wt Readings from Last 3 Encounters:  06/01/18 204 lb (92.5 kg)  05/06/18 198 lb (89.8 kg)  04/22/18 207 lb (93.9 kg)    Physical Exam Vitals signs and nursing note reviewed.  Constitutional:      General: [name] not in acute distress.    Appearance: [name] well-developed. [name] not diaphoretic.     Comments: Well-appearing, comfortable, cooperative  HENT:     Head: Normocephalic and atraumatic.  Eyes:     General:        Right eye: No discharge.      Left eye: No discharge.     Conjunctiva/sclera: Conjunctivae normal.  Cardiovascular:     Rate and Rhythm: Normal rate.  Pulmonary:     Effort: Pulmonary effort is normal.  Musculoskeletal:     Comments: Difficulty standing from seated position due to proximal muscle weakness, and lower extremity  Bilateral knee crepitus and reduced ROM.  Bilateral hips, R>L with reduced active ROM and pain on internal rotation.  Has cane for ambulation  Skin:    General: Skin is warm and dry.     Findings: No erythema or rash.  Neurological:     Mental Status: [name] alert and oriented to person, place, and time.  Psychiatric:        Behavior: Behavior normal.     Comments: Well groomed, good eye contact, normal speech and thoughts        Assessment & Plan:   Problem List Items Addressed This Visit    CML in remission [facility])    Other Visit Diagnoses    Generalized weakness    -  Primary   Bilateral leg weakness       Relevant Orders   DG Knee  Bilateral Standing AP   DG HIP UNILAT W OR W/O PELVIS 2-3 VIEWS RIGHT   Bilateral leg pain       Relevant Orders   DG Knee Bilateral Standing AP   DG HIP UNILAT W OR W/O PELVIS 2-3 VIEWS RIGHT   Right hip pain       Relevant Orders   DG HIP UNILAT W OR W/O PELVIS 2-3 VIEWS RIGHT      Clinically with known history of osteoarthritis multiple joints, now gradual worsening more generalized weakness and leg pain non specific, some component of knee pain bilateral and R>L hip as well, without triggering injury or other problem - Suspect component of deconditioning  Plan - X-rays today knee view b/l AP and R hip view as well - Increase Tylenol dosing PRN - Use muscle rub, heating pad - Referral to [facility] for further evaluation and management for RN and PT - Future consider other med options - limited due to CKD, consider Flexeril vs Gabapentin, caution sedation  No orders of the defined types were placed in this encounter.  Orders  Placed This Encounter  Procedures  . DG Knee Bilateral Standing AP    Standing Status:   Future    Number of Occurrences:   1    Standing Expiration Date:   08/01/2019    Order Specific Question:   Reason for Exam (SYMPTOM  OR DIAGNOSIS REQUIRED)    Answer:   bilateral knee pain and weakness, history of arthritis, worse in 1 month    Order Specific Question:   Preferred imaging location?    Answer:   [facility]    Order Specific Question:   Radiology Contrast Protocol - do NOT remove file path    Answer:   \\[email or web address]  . DG HIP UNILAT W OR W/O PELVIS 2-3 VIEWS RIGHT    Standing Status:   Future    Number of Occurrences:   1    Standing Expiration Date:   08/01/2019    Order Specific Question:   Reason for Exam (SYMPTOM  OR DIAGNOSIS REQUIRED)    Answer:   right worse than left hip, pain and stiffness weakness, no fall or injury, history of arthritis    Order Specific Question:   Preferred imaging location?    Answer:   [facility]    Order Specific Question:   Radiology Contrast Protocol - do NOT remove file path    Answer:   \\[email or web address]  . Ambulatory referral to Home Health    Referral Priority:   Routine    Referral Type:   Home Health Care    Referral Reason:   Specialty Services Required    Requested Specialty:   [place]    Number of Visits Requested:   1    Follow up plan: Return in about 2 months (around 08/04/2018) for 2 months for Diabetes A1c, Leg/Pain Weakness.   [name], [facility] 06/01/2018, 3:38 PM

## 2018-06-01 NOTE — Patient Instructions (Addendum)
Thank you for coming to the office today.  I think you have leg weakness due to muscle weakness and pain - we will order Home Health physical therapy - they will call and come out to the house to help out for next few weeks, to help with therapy  Recommend to start taking Tylenol Extra Strength 500mg  tabs - take 1 to 2 tabs per dose (max 1000mg ) every 6-8 hours for pain (take regularly, don't skip a dose for next 7 days), max 24 hour daily dose is 6 tablets or 3000mg . In the future you can repeat the same everyday Tylenol course for 1-2 weeks at a time.   Knee braces are helpful to support legs.  Carren Rang today - stay tuned for results.  Please schedule a Follow-up Appointment to: Return in about 2 months (around 08/04/2018) for 2 months for Diabetes A1c, Leg/Pain Weakness.  If you have any other questions or concerns, please feel free to call the office or send a message through [facility]. You may also schedule an earlier appointment if necessary.  Additionally, you may be receiving a survey about your experience at our office within a few days to 1 week by e-mail or mail. We value your feedback.  [name], DO [place] [facility]

## 2018-06-02 ENCOUNTER — Encounter: Payer: Self-pay | Admitting: Family Medicine

## 2018-06-02 DIAGNOSIS — M170 Bilateral primary osteoarthritis of knee: Secondary | ICD-10-CM | POA: Insufficient documentation

## 2018-06-02 NOTE — Telephone Encounter (Signed)
Hi Lucas Caldwell,   Can you see if we can add Lucas Caldwell on to see me or an APP tomorrow or Thursday? Sounds like he has increasing shortness of breath and palpitations. Thanks.   [employer] and s/w wife. Patient scheduled for tomorrow at 2:20 pm.

## 2018-06-02 NOTE — Telephone Encounter (Signed)
I spoke with Lucas Caldwell, who reports that he has felt short of breath with minimal activity such as bending over to tie his shoes or walking around his house for the last 3 weeks.  There was no clear precipitant.  He denies chest pain, edema, and weight gain.  He has noted palpitations with fluttering in his chest over the last 3 weeks.  He denies fevers but has occasionally had chills.  He has tried his albuterol inhaler without any improvement.  He reports being compliant with his medications.  He denies bleeding.  Given significant dyspnea with minimal activity as well as report of palpitations with history of heart failure CAD, and paroxysmal atrial fibrillation, I think it would be best for the patient to be evaluated in our office in the next day or two to get a better sense of what may be causing his symptoms.  I advised him to go to the ER if he has any worsening in the meantime.  For now, he should continue his current medications.  [name], MD [facility] Pager: [phone]

## 2018-06-03 ENCOUNTER — Ambulatory Visit (INDEPENDENT_AMBULATORY_CARE_PROVIDER_SITE_OTHER): Payer: Medicare HMO | Admitting: Internal Medicine

## 2018-06-03 ENCOUNTER — Encounter: Payer: Self-pay | Admitting: Internal Medicine

## 2018-06-03 ENCOUNTER — Other Ambulatory Visit: Payer: Self-pay

## 2018-06-03 VITALS — BP 118/68 | HR 88 | Ht 64.0 in | Wt 203.0 lb

## 2018-06-03 DIAGNOSIS — D649 Anemia, unspecified: Secondary | ICD-10-CM | POA: Diagnosis not present

## 2018-06-03 DIAGNOSIS — N184 Chronic kidney disease, stage 4 (severe): Secondary | ICD-10-CM | POA: Diagnosis not present

## 2018-06-03 DIAGNOSIS — I5033 Acute on chronic diastolic (congestive) heart failure: Secondary | ICD-10-CM | POA: Diagnosis not present

## 2018-06-03 DIAGNOSIS — I480 Paroxysmal atrial fibrillation: Secondary | ICD-10-CM

## 2018-06-03 DIAGNOSIS — I25118 Atherosclerotic heart disease of native coronary artery with other forms of angina pectoris: Secondary | ICD-10-CM

## 2018-06-03 DIAGNOSIS — I5032 Chronic diastolic (congestive) heart failure: Secondary | ICD-10-CM | POA: Diagnosis not present

## 2018-06-03 NOTE — Progress Notes (Signed)
Follow-up Outpatient Visit Date: 06/03/2018  Primary Care Provider: [name], DO [address] [place] [address]  Chief Complaint: Shortness of breath  HPI:  Lucas Caldwell is a 85 y.o. year-old male with history of coronary artery disease, mixed ischemic and nonischemic cardiomyopathy, hypertension, hyperlipidemia, type 2 diabetes mellitus, paroxysmal atrial fibrillation, chronic kidney disease stage III, CML, iron deficiency anemia,GI bleed due to duodenal ulcers,BPH, and hypothyroidism, who presents for urgent evaluation of shortness of breath with minimal activity and palpitations.  Lucas Caldwell was last seen in our office on 04/22/2018 by [name], NP.  He noted that his chronic exertional dyspnea may have gotten slightly worse at that time.  However, over the last 3 weeks, he feels like even tying his shoes or walking through his house is difficult on account of shortness of breath.  Today, Lucas Caldwell reports that he feels about the same as when we spoke by phone yesterday.  He has continued shortness of breath, especially when bending over to tie his shoes.  This is been stable for the last 3 weeks or so.  He denies edema or orthopnea, though his weight is up about 5 pounds.  He attributes some of this to improved oral intake after dental work.  He denies chest pain and lightheadedness but notes occasional palpitations during which it feels as though his heart skips a beat.  He has been compliant with his medications.  --------------------------------------------------------------------------------------------------  Past Medical History:  Diagnosis Date  . Arthritis    "probably in his legs" (08/15/2016)  . Blind right eye   . BPH (benign prostatic hyperplasia)   . Breast asymmetry    Left breast is larger, present for several years.  [name] CAD (coronary artery disease)    a. Cath in the late 90's - reportedly ok;  b. 2014 s/p stenting x 2 @ [facility]; c 08/19/16 Cath/PCI with  DES -> RCA, plan to treat LM 70% medically. Seen by surgery and felt to be too high risk for CABG.  . Chronic combined systolic (congestive) and diastolic (congestive) heart failure ([place])    a. Previously reduced EF-->50% by echo in 2012;  b. 06/2015 Echo: EF 50-55%  c. 07/2016 Echo: EF 45-50%; d. 12/2017 Echo: EF 55%, Gr1 DD, mild MR.  . Chronic kidney disease (CKD), stage IV (severe) ([place])   . CML (chronic myelocytic leukemia) ([place])   . GERD (gastroesophageal reflux disease)   . GIB (gastrointestinal bleeding)    a. 12/2017 3 unit PRBC GIB in setting of coumadin-->Endo/colon multiple duodenal ulcers and a single bleeding ulcer in the proximal ascending colon status post hemostatic clipping x2.  . Gout   . Hyperlipemia   . Hypertension   . Hypothyroidism   . Iron deficiency anemia   . Migraine    "in the 1960s" (08/15/2016)  . NICM (nonischemic cardiomyopathy) ([facility] [facility])    a. Previously reduced EF-->50% by echo in 2012;  b. 06/2015 Echo: EF 50-55%, Gr2 DD, mild MR, mildly dil LA, Ao sclerosis, mild TR;  c. 07/2016 Echo: EF 45-50%; d. 12/2017 Echo: EF 55%, Gr1 DD, mild MR.  [name] PAF (paroxysmal atrial fibrillation) ([facility])    a. ? Dx 2014-->s/p DCCV;  b. CHA2DS2VASc = 6--> Coumadin.  . Prostate cancer ([place])   . RBBB   . Type II diabetes mellitus ([place])    Past Surgical History:  Procedure Laterality Date  . CATARACT EXTRACTION W/ INTRAOCULAR LENS  IMPLANT, BILATERAL Bilateral   . COLONOSCOPY N/A  01/13/2018   Procedure: COLONOSCOPY;  Surgeon: Lin Landsman, MD;  Location: [facility] ENDOSCOPY;  Service: Gastroenterology;  Laterality: N/A;  . COLONOSCOPY WITH PROPOFOL N/A 01/01/2018   Procedure: COLONOSCOPY WITH PROPOFOL;  Surgeon: Lin Landsman, MD;  Location: [facility] ENDOSCOPY;  Service: Gastroenterology;  Laterality: N/A;  . CORONARY ANGIOPLASTY WITH STENT PLACEMENT  09/2012   2 stents  . CORONARY STENT INTERVENTION N/A 08/19/2016   Procedure: Coronary Stent Intervention;  Surgeon: [name], MD;  Location: [place] CV LAB;  Service: Cardiovascular;  Laterality: N/A;  . ESOPHAGEAL MANOMETRY N/A 12/16/2017   Procedure: ESOPHAGEAL MANOMETRY (EM);  Surgeon: Lin Landsman, MD;  Location: [facility] ENDOSCOPY;  Service: Gastroenterology;  Laterality: N/A;  . ESOPHAGOGASTRODUODENOSCOPY N/A 12/20/2016   Procedure: ESOPHAGOGASTRODUODENOSCOPY (EGD);  Surgeon: Lin Landsman, MD;  Location: [facility] ENDOSCOPY;  Service: Gastroenterology;  Laterality: N/A;  . ESOPHAGOGASTRODUODENOSCOPY N/A 01/13/2018   Procedure: ESOPHAGOGASTRODUODENOSCOPY (EGD);  Surgeon: Lin Landsman, MD;  Location: [facility] END[facility]COPY;  Service: Gastroenterology;  Laterality: N/A;  . ESOPHAGOGASTRODUODENOSCOPY (EGD) WITH PROPOFOL N/A 11/03/2017   Procedure: ESOPHAGOGASTRODUODENOSCOPY (EGD) WITH PROPOFOL;  Surgeon: Lin Landsman, MD;  Location: [facility] ENDOSCOPY;  Service: Gastroenterology;  Laterality: N/A;  . EYE SURGERY    . HERNIA REPAIR     "[place]"  . LEFT HEART CATH AND CORONARY ANGIOGRAPHY N/A 08/14/2016   Procedure: Left Heart Cath and Coronary Angiography;  Surgeon: [name], MD;  Location: [place] CV LAB;  Service: Cardiovascular;  Laterality: N/A;  . TOE AMPUTATION Right    "big toe"    Current Meds  Medication Sig  . acetaminophen (TYLENOL) 500 MG tablet Take 500 mg by mouth every 6 (six) hours as needed.  [name] albuterol (PROVENTIL HFA;VENTOLIN HFA) 108 (90 Base) MCG/ACT inhaler Inhale 2 puffs into the lungs every 6 (six) hours as needed for wheezing or shortness of breath.  . Alcohol Swabs 70 % PADS Use when checking blood sugar up to 3x daily  . allopurinol (ZYLOPRIM) 100 MG tablet Take 1 tablet (100 mg total) by mouth daily.  . bosutinib (BOSULIF) 100 MG tablet Take 200 mg by mouth at bedtime. Take with food.   . Dulaglutide (TRULICITY) 9.37 JI/9.6VE SOPN Inject 0.75 mg into the skin once a week.  . enalapril (VASOTEC) 5 MG tablet Take 1 tablet (5 mg total) by mouth daily.  .  fluticasone furoate-vilanterol (BREO ELLIPTA) 100-25 MCG/INH AEPB Inhale 1 puff into the lungs daily.  [name] gabapentin (NEURONTIN) 300 MG capsule TAKE 2 CAPSULES BY MOUTH EVERY MORNING, 1 CAPSULE IN AFTERNOON, 2 CAPS AT BEDTIME. IN FUTURE INCREASE UP TO 3 CAPS 3 TIMES A DAY  . Insulin Detemir (LEVEMIR FLEXTOUCH) 100 UNIT/ML Pen Inject 60 Units into the skin daily.  . Insulin Pen Needle 31G X 5 MM MISC 31 g by Does not apply route as directed.  . isosorbide mononitrate (IMDUR) 30 MG 24 hr tablet Take 0.5 tablets (15 mg total) by mouth daily.  [name] levothyroxine (SYNTHROID, LEVOTHROID) 25 MCG tablet Take 1 tablet (25 mcg total) by mouth daily before breakfast.  . loperamide (IMODIUM A-D) 2 MG tablet Take 2 mg by mouth 3 (three) times daily as needed for diarrhea or loose stools.   . metoprolol succinate (TOPROL XL) 25 MG 24 hr tablet Take 1 tablet (25 mg total) by mouth daily.  . Multiple Vitamins-Minerals (MULTIVITAMIN ADULTS 50+ PO) Take 1 tablet by mouth every morning.  . nepafenac (NEVANAC) 0.1 % ophthalmic suspension Place 1 drop into both eyes  3 (three) times daily as needed.  . nitroGLYCERIN (NITROSTAT) 0.4 MG SL tablet Place 1 tablet (0.4 mg total) under the tongue every 5 (five) minutes x 3 doses as needed for chest pain.  [name] omeprazole (PRILOSEC) 40 MG capsule TAKE ONE CAPSULE BY MOUTH TWICE DAILY  . Spacer/Aero-Holding Chambers (OPTICHAMBER ADVANTAGE) MISC 1 each by Other route once.  . torsemide (DEMADEX) 20 MG tablet Take 1 tablet (20 mg total) by mouth 2 (two) times daily.  [name] warfarin (COUMADIN) 3 MG tablet Take 1 tablet (3 mg total) by mouth daily. Take as directed by the coumadin clinic.    Allergies: Clopidogrel; Ciprofloxacin; Mirtazapine; and Spironolactone  Social History   Tobacco Use  . Smoking status: Former Smoker    Packs/day: 2.00    Years: 15.00    Pack years: 30.00    Types: Cigarettes  . Smokeless tobacco: Former Systems developer  . Tobacco comment: quit 1979  Substance Use  Topics  . Alcohol use: No    Comment: previously drank heavily - quit 1979.  . Drug use: No    Family History  Problem Relation Age of Onset  . Brain cancer Father   . Diabetes Sister   . Heart attack Sister     Review of Systems: A 12-system review of systems was performed and was negative except as noted in the HPI.  --------------------------------------------------------------------------------------------------  Physical Exam: BP 118/68 (BP Location: Left Arm, Patient Position: Sitting, Cuff Size: Normal)   Pulse 88   Ht 5\' 4"  (1.626 m)   Wt 203 lb (92.1 kg)   SpO2 98%   BMI 34.84 kg/m   General: NAD.  Accompanied by his wife. HEENT: No conjunctival pallor or scleral icterus. Moist mucous membranes.  OP clear. Neck: Supple without lymphadenopathy, thyromegaly, JVD, or HJR. Lungs: Normal work of breathing. Clear to auscultation bilaterally without wheezes or crackles. Heart: Distant heart sounds.  Regular rate and rhythm without murmurs, rubs, or gallops.  Unable to assess PMI due to body habitus. Abd: Bowel sounds present. Soft, NT/ND.  Unable to assess HSM due to body habitus. Ext: Trace pretibial edema bilaterally. Radial, PT, and DP pulses are 2+ bilaterally. Skin: Warm and dry without rash.  EKG: Normal sinus rhythm with PVCs, LAFB, and RBBB (bifascicular block).  PVCs are new since 04/22/2018.  Otherwise, there has been no significant interval change.  Lab Results  Component Value Date   WBC 8.6 04/22/2018   HGB 11.4 (L) 04/22/2018   HCT 35.4 (L) 04/22/2018   MCV 82 04/22/2018   PLT 222 04/22/2018    Lab Results  Component Value Date   NA 140 04/22/2018   K 3.7 04/22/2018   CL 99 04/22/2018   CO2 21 04/22/2018   BUN 19 04/22/2018   CREATININE 1.33 (H) 04/22/2018   GLUCOSE 214 (H) 04/22/2018   ALT 10 04/22/2018    Lab Results  Component Value Date   CHOL 99 01/08/2017   HDL 21 (L) 01/08/2017   LDLCALC 54 01/08/2017   TRIG 118 01/08/2017    CHOLHDL 4.7 01/08/2017    --------------------------------------------------------------------------------------------------  ASSESSMENT AND PLAN: Acute on chronic HFpEF Increased dyspnea, particularly when bending over, has now been present for about 3 weeks.  Symptoms could be due to a number of factors.  However, given history of HFpEF and approximately 5 pound weight gain over the last month, I am most concerned for fluid retention.  I have advised Lucas Caldwell to increase his torsemide to 40 mg twice daily  for 3 days to see if this improves his symptoms.  He should then return back to 20 mg twice daily thereafter.  I will check a BNP and BMP today.  I have also asked Lucas Caldwell to contact us early next week by phone to response to increased diuresis.  If symptoms do not improve despite diuresis, we will need to consider ischemia testing; favor noninvasive testing given borderline renal function in the past as well as chronic anticoagulation.  It should be noted that he is status post PCI to the RCA as well as medical management of moderate left main disease.  Doses of enalapril and metoprolol succinate.  Coronary artery disease with stable angina I am more concerned for acute on chronic heart failure than accelerating angina with shortness of breath outlined above.  However, as above, if symptoms do not improve with escalation of diuresis, will need to consider ischemia testing.  In the meantime, we will continue with warfarin in lieu of antiplatelet therapy given history of GI bleeding last year.  Antianginal therapy with metoprolol succinate and isosorbide mononitrate will be continued at current doses.  Anemia Hemoglobin had been improving on last check.  In light of worsening shortness of breath, history of recent GI bleed, and ongoing anticoagulation with warfarin, I will check a CBC today.  Paroxysmal atrial fibrillation Intermittent palpitations reported by Lucas Caldwell, though EKG's today  and last month showed sinus rhythm.  He may be aware of PVCs, as noted on today's tracing.  Continue current doses of metoprolol as well as warfarin, as managed by anticoagulation clinic.  Chronic kidney disease Creatinine has been stable.  In light of escalating diuresis, as outlined above, I will check a BMP today.  Follow-up: Return to clinic in 1 month.  [name], MD 06/03/2018 2:59 PM

## 2018-06-03 NOTE — Patient Instructions (Signed)
Medication Instructions:  Your physician has recommended you make the following change in your medication:  1- INCREASE Torsemide to 40 mg (2 tablets) by mouth two times a day for 3 days, THEN go back to usual dose of 20 mg  (1 tablet) by mouth two times a day.  Please call our office on Monday, 06/08/18, if you are not doing any better.   If you need a refill on your cardiac medications before your next appointment, please call your pharmacy.   Lab work: Your physician recommends that you return for lab work in: TODAY - CBC, BMET, BNP.  If you have labs (blood work) drawn today and your tests are completely normal, you will receive your results only by: [name] MyChart Message (if you have MyChart) OR . A paper copy in the mail If you have any lab test that is abnormal or we need to change your treatment, we will call you to review the results.  Testing/Procedures: none  Follow-Up: At [facility], you and your health needs are our priority.  As part of our continuing mission to provide you with exceptional heart care, we have created designated Provider Care Teams.  These Care Teams include your primary Cardiologist (physician) and Advanced Practice Providers (APPs -  Physician Assistants and Nurse Practitioners) who all work together to provide you with the care you need, when you need it. You will need a follow up appointment in 1 months.  Please call our office 2 months in advance to schedule this appointment.  You may see [name], MD or one of the following Advanced Practice Providers on your designated Care Team:   [name], NP [name], PA-C . [name], PA-C

## 2018-06-04 ENCOUNTER — Encounter: Payer: Self-pay | Admitting: Internal Medicine

## 2018-06-04 DIAGNOSIS — N184 Chronic kidney disease, stage 4 (severe): Secondary | ICD-10-CM | POA: Insufficient documentation

## 2018-06-04 LAB — BASIC METABOLIC PANEL
BUN/Creatinine Ratio: 12 (ref 10–24)
BUN: 16 mg/dL (ref 8–27)
CO2: 20 mmol/L (ref 20–29)
Calcium: 9.3 mg/dL (ref 8.6–10.2)
Chloride: 103 mmol/L (ref 96–106)
Creatinine, Ser: 1.33 mg/dL — ABNORMAL HIGH (ref 0.76–1.27)
GFR calc Af Amer: 57 mL/min/{1.73_m2} — ABNORMAL LOW (ref >59)
GFR calc non Af Amer: 49 mL/min/{1.73_m2} — ABNORMAL LOW (ref >59)
Glucose: 136 mg/dL — ABNORMAL HIGH (ref 65–99)
Potassium: 3.8 mmol/L (ref 3.5–5.2)
Sodium: 140 mmol/L (ref 134–144)

## 2018-06-04 LAB — CBC WITH DIFFERENTIAL/PLATELET
Basophils Absolute: 0.1 10*3/uL (ref 0.0–0.2)
Basos: 1 %
EOS (ABSOLUTE): 0.2 10*3/uL (ref 0.0–0.4)
EOS: 2 %
HEMATOCRIT: 35.5 % — AB (ref 37.5–51.0)
Hemoglobin: 11.5 g/dL — ABNORMAL LOW (ref 13.0–17.7)
Immature Grans (Abs): 0 10*3/uL (ref 0.0–0.1)
Immature Granulocytes: 0 %
Lymphocytes Absolute: 1.8 10*3/uL (ref 0.7–3.1)
Lymphs: 26 %
MCH: 26.6 pg (ref 26.6–33.0)
MCHC: 32.4 g/dL (ref 31.5–35.7)
MCV: 82 fL (ref 79–97)
Monocytes Absolute: 0.6 10*3/uL (ref 0.1–0.9)
Monocytes: 8 %
Neutrophils Absolute: 4.3 10*3/uL (ref 1.4–7.0)
Neutrophils: 63 %
Platelets: 193 10*3/uL (ref 150–450)
RBC: 4.33 x10E6/uL (ref 4.14–5.80)
RDW: 15.7 % — AB (ref 11.6–15.4)
WBC: 6.9 10*3/uL (ref 3.4–10.8)

## 2018-06-04 LAB — BRAIN NATRIURETIC PEPTIDE: BNP: 83 pg/mL (ref 0.0–100.0)

## 2018-06-07 DIAGNOSIS — M150 Primary generalized (osteo)arthritis: Secondary | ICD-10-CM | POA: Diagnosis not present

## 2018-06-07 DIAGNOSIS — H5461 Unqualified visual loss, right eye, normal vision left eye: Secondary | ICD-10-CM | POA: Diagnosis not present

## 2018-06-07 DIAGNOSIS — N189 Chronic kidney disease, unspecified: Secondary | ICD-10-CM | POA: Diagnosis not present

## 2018-06-07 DIAGNOSIS — C9211 Chronic myeloid leukemia, BCR/ABL-positive, in remission: Secondary | ICD-10-CM | POA: Diagnosis not present

## 2018-06-07 DIAGNOSIS — E1122 Type 2 diabetes mellitus with diabetic chronic kidney disease: Secondary | ICD-10-CM | POA: Diagnosis not present

## 2018-06-07 DIAGNOSIS — Z7901 Long term (current) use of anticoagulants: Secondary | ICD-10-CM | POA: Diagnosis not present

## 2018-06-07 DIAGNOSIS — Z87891 Personal history of nicotine dependence: Secondary | ICD-10-CM | POA: Diagnosis not present

## 2018-06-07 DIAGNOSIS — E1140 Type 2 diabetes mellitus with diabetic neuropathy, unspecified: Secondary | ICD-10-CM | POA: Diagnosis not present

## 2018-06-07 DIAGNOSIS — Z794 Long term (current) use of insulin: Secondary | ICD-10-CM | POA: Diagnosis not present

## 2018-06-08 ENCOUNTER — Telehealth: Payer: Self-pay | Admitting: Internal Medicine

## 2018-06-08 ENCOUNTER — Other Ambulatory Visit: Payer: Self-pay

## 2018-06-08 MED ORDER — TRUE METRIX AIR GLUCOSE METER DEVI: 1.0000 | Freq: Two times a day (BID) | 0 refills | Status: DC

## 2018-06-08 MED ORDER — GLUCOSE BLOOD VI STRP: ORAL_STRIP | 12 refills | Status: DC

## 2018-06-08 NOTE — Telephone Encounter (Signed)
Patient was seen by Dr End on 3/18 Was advised to call on Monday and give an update on how he's doing  No details given would like to speak with a nurse Please call to discuss

## 2018-06-09 ENCOUNTER — Ambulatory Visit (INDEPENDENT_AMBULATORY_CARE_PROVIDER_SITE_OTHER): Payer: Medicare HMO | Admitting: Family Medicine

## 2018-06-09 ENCOUNTER — Ambulatory Visit: Payer: Medicare HMO | Admitting: Family Medicine

## 2018-06-09 ENCOUNTER — Other Ambulatory Visit: Payer: Self-pay

## 2018-06-09 ENCOUNTER — Encounter: Payer: Self-pay | Admitting: Family Medicine

## 2018-06-09 VITALS — BP 111/70 | HR 90 | Temp 97.8°F | Resp 16 | Ht 64.0 in | Wt 198.0 lb

## 2018-06-09 DIAGNOSIS — M25561 Pain in right knee: Secondary | ICD-10-CM | POA: Diagnosis not present

## 2018-06-09 DIAGNOSIS — M170 Bilateral primary osteoarthritis of knee: Secondary | ICD-10-CM | POA: Diagnosis not present

## 2018-06-09 MED ORDER — METHYLPREDNISOLONE ACETATE 40 MG/ML IJ SUSP
40.0000 mg | Freq: Once | INTRAMUSCULAR | Status: AC
  Administered 2018-06-09: 40 mg via INTRA_ARTICULAR

## 2018-06-09 MED ORDER — LIDOCAINE HCL (PF) 1 % IJ SOLN
4.0000 mL | Freq: Once | INTRAMUSCULAR | Status: AC
  Administered 2018-06-09: 4 mL

## 2018-06-09 NOTE — Patient Instructions (Addendum)
Thank you for coming to the office today.  You received a Right Knee Joint steroid injection today. - Lidocaine numbing medicine may ease the pain initially for a few hours until it wears off - As discussed, you may experience a "steroid flare" this evening or within 24-48 hours, anytime medicine is injected into an inflamed joint it can cause the pain to get worse temporarily - Everyone responds differently to these injections, it depends on the patient and the severity of the joint problem, it may provide anywhere from days to weeks, to months of relief. Ideal response is >6 months relief - Try to take it easy for next 1-2 days, avoid over activity and strain on joint (limit walking for knee) - Recommend the following:   - For swelling - rest, compression sleeve / ACE wrap, elevation, and ice packs as needed for first few days   - For pain in future may use heating pad or moist heat as needed  Medication  Recommend to start taking Tylenol Extra Strength 500mg  tabs - take 1 to 2 tabs per dose (max 1000mg ) every 6-8 hours for pain (take regularly, don't skip a dose for next 7 days), max 24 hour daily dose is 6 tablets or 3000mg . In the future you can repeat the same everyday Tylenol course for 1-2 weeks at a time.    Please schedule a Follow-up Appointment to: Return in about 3 months (around 09/09/2018) for DM A1c, Knee pain, arthritis, L knee inj??.  If you have any other questions or concerns, please feel free to call the office or send a message through [place]. You may also schedule an earlier appointment if necessary.  Additionally, you may be receiving a survey about your experience at our office within a few days to 1 week by e-mail or mail. We value your feedback.  [name], DO [place]

## 2018-06-09 NOTE — Telephone Encounter (Signed)
Spoke with patient's wife, ok per dpr. She verbalized understanding and repeated back several times Dr Darnelle Bos recommendations.

## 2018-06-09 NOTE — Progress Notes (Signed)
Subjective:    Patient ID: Lucas Caldwell, male    DOB: May 17, 1933, 85 y.o.   MRN: [id number]  Lucas Caldwell is a 85 y.o. male presenting on 06/09/2018 for Knee Pain (Right side)  Patient accompanied by wife, [place].  HPI   FOLLOW-UP Knee Pain, Osteoarthritis - Last visit with me 06/01/18, for initial visit for same problem generalized weakness and knee / hip pain arthritis and falls, treated with X-rays Knees bilateral R Hip and referral to [facility] for RN PT, see prior notes for background information. - Interval update with now he returns ready for knee injection after results of his X-ray were reviewed by phone with significant arthritis degenerative bilateral knees, less arthritis within hips - Today patient reports persistent problem with R knee pain, also bilateral knee pain, radiating up leg into hip at times, similar to last discussion he has some weakness still and it has affected his gait and balance in past, no further falls since last visit - [name] taking Tylenol PRN, wearing knee brace with some relief - HH PT has been ordered and they have seen him already establishing his therapy - Note he is on anticoagulation for paroxysmal atrial fibrillation - Cannot take NSAID due to CKD - Denies numbness tingling swelling, redness, dyspnea, chest pain, near syncope, fall   Depression screen [facility] 2/9 06/09/2018 06/01/2018 05/06/2018  Decreased Interest 0 0 0  Down, Depressed, Hopeless 0 0 0  PHQ - 2 Score 0 0 0    Social History   Tobacco Use  . Smoking status: Former Smoker    Packs/day: 2.00    Years: 15.00    Pack years: 30.00    Types: Cigarettes  . Smokeless tobacco: Former Systems developer  . Tobacco comment: quit 1979  Substance Use Topics  . Alcohol use: No    Comment: previously drank heavily - quit 1979.  . Drug use: No    Review of Systems Per HPI unless specifically indicated above     Objective:    BP 111/70   Pulse 90   Temp 97.8 F (36.6 C) (Oral)    Resp 16   Ht 5\' 4"  (1.626 m)   Wt 198 lb (89.8 kg)   BMI 33.99 kg/m   Wt Readings from Last 3 Encounters:  06/09/18 198 lb (89.8 kg)  06/03/18 203 lb (92.1 kg)  06/01/18 204 lb (92.5 kg)    Physical Exam Vitals signs and nursing note reviewed.  Constitutional:      General: [name] not in acute distress.    Appearance: [name] well-developed. [name] not diaphoretic.     Comments: Well-appearing, comfortable, cooperative  HENT:     Head: Normocephalic and atraumatic.  Eyes:     General:        Right eye: No discharge.        Left eye: No discharge.     Conjunctiva/sclera: Conjunctivae normal.  Cardiovascular:     Rate and Rhythm: Normal rate.  Pulmonary:     Effort: Pulmonary effort is normal.  Skin:    General: Skin is warm and dry.     Findings: No erythema or rash.  Neurological:     Mental Status: [name] alert and oriented to person, place, and time.  Psychiatric:        Behavior: Behavior normal.     Comments: Well groomed, good eye contact, normal speech and thoughts      ________________________________________________________ PROCEDURE NOTE Date: 06/09/18  Right Knee steroid injection Discussed benefits and risks (including pain, bleeding, infection, steroid flare). Verbal consent given by patient. Medication:  1 cc Depo-medrol 40mg  and 4 cc Lidocaine 1% without epi Time Out taken  Landmarks identified. Area cleansed with alcohol wipes. Using 21 gauge and 1, 1/2 inch needle, Right knee joint space was injected (with above listed medication) via medial approach cold spray used for superficial anesthetic. Sterile bandage placed. Patient tolerated procedure well without bleeding or paresthesias. No complications.  ----------------------------------------  I have personally reviewed the radiology report from 06/01/18 Knee x-rays bilateral AP stand.  CLINICAL DATA:  Bilateral knee pain.  EXAM: BILATERAL KNEES STANDING - 1 VIEW  COMPARISON:  No prior.  FINDINGS:  Diffuse degenerative change most prominent about the medial compartment noted bilaterally. Loss of medial joint space noted bilaterally. No acute bony or joint abnormality identified. No evidence of fracture dislocation. Peripheral vascular calcification noted.  IMPRESSION: 1. Diffuse degenerative change, most prominent about the medial compartment noted bilaterally. Loss of medial joint space noted bilaterally. No acute bony or joint abnormality.  2.  Peripheral vascular disease.   Electronically Signed   By: Marcello Moores  Register   On: 06/02/2018 08:48  Results for orders placed or performed in visit on 32/35/57  Basic metabolic panel  Result Value Ref Range   Glucose 136 (H) 65 - 99 mg/dL   BUN 16 8 - 27 mg/dL   Creatinine, Ser 1.33 (H) 0.76 - 1.27 mg/dL   GFR calc non Af Amer 49 (L) >59 mL/min/1.73   GFR calc Af Amer 57 (L) >59 mL/min/1.73   BUN/Creatinine Ratio 12 10 - 24   Sodium 140 134 - 144 mmol/L   Potassium 3.8 3.5 - 5.2 mmol/L   Chloride 103 96 - 106 mmol/L   CO2 20 20 - 29 mmol/L   Calcium 9.3 8.6 - 10.2 mg/dL  Brain natriuretic peptide  Result Value Ref Range   BNP 83.0 0.0 - 100.0 pg/mL  CBC with Differential/Platelet  Result Value Ref Range   WBC 6.9 3.4 - 10.8 x10E3/uL   RBC 4.33 4.14 - 5.80 x10E6/uL   Hemoglobin 11.5 (L) 13.0 - 17.7 g/dL   Hematocrit 35.5 (L) 37.5 - 51.0 %   MCV 82 79 - 97 fL   MCH 26.6 26.6 - 33.0 pg   MCHC 32.4 31.5 - 35.7 g/dL   RDW 15.7 (H) 11.6 - 15.4 %   Platelets 193 150 - 450 x10E3/uL   Neutrophils 63 Not Estab. %   Lymphs 26 Not Estab. %   Monocytes 8 Not Estab. %   Eos 2 Not Estab. %   Basos 1 Not Estab. %   Neutrophils Absolute 4.3 1.4 - 7.0 x10E3/uL   Lymphocytes Absolute 1.8 0.7 - 3.1 x10E3/uL   Monocytes Absolute 0.6 0.1 - 0.9 x10E3/uL   EOS (ABSOLUTE) 0.2 0.0 - 0.4 x10E3/uL   Basophils Absolute 0.1 0.0 - 0.2 x10E3/uL   Immature Granulocytes 0 Not Estab. %   Immature Grans (Abs) 0.0 0.0 - 0.1 x10E3/uL       Assessment & Plan:   Problem List Items Addressed This Visit    Osteoarthritis of knees, bilateral   Relevant Medications   methylPREDNISolone acetate (DEPO-MEDROL) injection 40 mg (Completed)   lidocaine (PF) (XYLOCAINE) 1 % injection 4 mL (Completed)    Other Visit Diagnoses    Right medial knee pain    -  Primary   Relevant Medications   methylPREDNISolone acetate (DEPO-MEDROL) injection 40 mg (Completed)  lidocaine (PF) (XYLOCAINE) 1 % injection 4 mL (Completed)      Subacute on chronic R >L medial Knee pain without obvious swelling without known injury or trauma  Known knee OA/DJD confirmed on recent X-ray - Able to bear weight, no knee instability or mechanical locking - No prior history of knee surgery, arthroscopy - Inadequate conservative therapy   Plan: 1. R knee steroid injection today - see procedure note above, tolerated well 2. Continue high dose Tylenol 1g TID PRN 3. RICE therapy (rest, ice, compression, elevation) for swelling, activity modification 4. Follow-up 4 weeks up to 3 months - may warrant Left knee injection if successful on the R knee - if ineffective - consider referral to Orthopedics next for further options given multiple joint involvement.  Return precautions reviewed  Meds ordered this encounter  Medications  . methylPREDNISolone acetate (DEPO-MEDROL) injection 40 mg  . lidocaine (PF) (XYLOCAINE) 1 % injection 4 mL     Follow up plan: Return in about 3 months (around 09/09/2018) for DM A1c, Knee pain, arthritis, L knee inj??.   [name], DO [facility] 06/09/2018, 11:45 AM

## 2018-06-09 NOTE — Telephone Encounter (Signed)
Spoke with patient. States his breathing is much better. [name] not having any chest pain or swelling in legs/abdomen. Says he is not having the feeling like hard to get his breath when he is sitting in his recliner. He feels better though he does not think he urinated as much as he should have considering increasing Torsemide over the 3 days. He does not weigh regularly at home. I encouaraged him to do so first thing in the morning everyday. 3/18  He was 203lb at OV with Dr End. 3/24      198lb with PCP today.  Advised I will update Dr End with this information.

## 2018-06-09 NOTE — Telephone Encounter (Signed)
Given improvement in breathing, I recommend that Lucas Caldwell continue with his current dose of torsemide 20 mg twice daily.  If possible, he should weigh himself at home.  If his weight increases above 200 pounds, he should increase torsemide to 40 mg twice daily until he returns back to his baseline weight (198 pounds).  He should continue the remainder of his medications.  He should continue to minimize salt intake.  [name], MD [facility] Pager: [phone]

## 2018-06-10 DIAGNOSIS — I878 Other specified disorders of veins: Secondary | ICD-10-CM | POA: Diagnosis not present

## 2018-06-10 DIAGNOSIS — B351 Tinea unguium: Secondary | ICD-10-CM | POA: Diagnosis not present

## 2018-06-10 DIAGNOSIS — E1142 Type 2 diabetes mellitus with diabetic polyneuropathy: Secondary | ICD-10-CM | POA: Diagnosis not present

## 2018-06-13 DIAGNOSIS — Z87891 Personal history of nicotine dependence: Secondary | ICD-10-CM | POA: Diagnosis not present

## 2018-06-13 DIAGNOSIS — E1122 Type 2 diabetes mellitus with diabetic chronic kidney disease: Secondary | ICD-10-CM | POA: Diagnosis not present

## 2018-06-13 DIAGNOSIS — H5461 Unqualified visual loss, right eye, normal vision left eye: Secondary | ICD-10-CM | POA: Diagnosis not present

## 2018-06-13 DIAGNOSIS — C9211 Chronic myeloid leukemia, BCR/ABL-positive, in remission: Secondary | ICD-10-CM | POA: Diagnosis not present

## 2018-06-13 DIAGNOSIS — E1140 Type 2 diabetes mellitus with diabetic neuropathy, unspecified: Secondary | ICD-10-CM | POA: Diagnosis not present

## 2018-06-13 DIAGNOSIS — Z794 Long term (current) use of insulin: Secondary | ICD-10-CM | POA: Diagnosis not present

## 2018-06-13 DIAGNOSIS — N189 Chronic kidney disease, unspecified: Secondary | ICD-10-CM | POA: Diagnosis not present

## 2018-06-13 DIAGNOSIS — Z7901 Long term (current) use of anticoagulants: Secondary | ICD-10-CM | POA: Diagnosis not present

## 2018-06-13 DIAGNOSIS — M150 Primary generalized (osteo)arthritis: Secondary | ICD-10-CM | POA: Diagnosis not present

## 2018-06-16 ENCOUNTER — Other Ambulatory Visit: Payer: Self-pay | Admitting: Family Medicine

## 2018-06-16 DIAGNOSIS — N189 Chronic kidney disease, unspecified: Secondary | ICD-10-CM | POA: Diagnosis not present

## 2018-06-16 DIAGNOSIS — J449 Chronic obstructive pulmonary disease, unspecified: Secondary | ICD-10-CM

## 2018-06-16 DIAGNOSIS — E1122 Type 2 diabetes mellitus with diabetic chronic kidney disease: Secondary | ICD-10-CM | POA: Diagnosis not present

## 2018-06-16 DIAGNOSIS — E1140 Type 2 diabetes mellitus with diabetic neuropathy, unspecified: Secondary | ICD-10-CM | POA: Diagnosis not present

## 2018-06-16 DIAGNOSIS — H5461 Unqualified visual loss, right eye, normal vision left eye: Secondary | ICD-10-CM | POA: Diagnosis not present

## 2018-06-16 DIAGNOSIS — Z7901 Long term (current) use of anticoagulants: Secondary | ICD-10-CM | POA: Diagnosis not present

## 2018-06-16 DIAGNOSIS — M150 Primary generalized (osteo)arthritis: Secondary | ICD-10-CM | POA: Diagnosis not present

## 2018-06-16 DIAGNOSIS — Z87891 Personal history of nicotine dependence: Secondary | ICD-10-CM | POA: Diagnosis not present

## 2018-06-16 DIAGNOSIS — Z794 Long term (current) use of insulin: Secondary | ICD-10-CM | POA: Diagnosis not present

## 2018-06-16 DIAGNOSIS — C9211 Chronic myeloid leukemia, BCR/ABL-positive, in remission: Secondary | ICD-10-CM | POA: Diagnosis not present

## 2018-06-17 ENCOUNTER — Other Ambulatory Visit: Payer: Self-pay | Admitting: Family Medicine

## 2018-06-17 DIAGNOSIS — E1165 Type 2 diabetes mellitus with hyperglycemia: Principal | ICD-10-CM

## 2018-06-17 DIAGNOSIS — E1121 Type 2 diabetes mellitus with diabetic nephropathy: Secondary | ICD-10-CM

## 2018-06-17 DIAGNOSIS — IMO0002 Reserved for concepts with insufficient information to code with codable children: Secondary | ICD-10-CM

## 2018-06-19 DIAGNOSIS — C9211 Chronic myeloid leukemia, BCR/ABL-positive, in remission: Secondary | ICD-10-CM | POA: Diagnosis not present

## 2018-06-19 DIAGNOSIS — Z794 Long term (current) use of insulin: Secondary | ICD-10-CM | POA: Diagnosis not present

## 2018-06-19 DIAGNOSIS — E1140 Type 2 diabetes mellitus with diabetic neuropathy, unspecified: Secondary | ICD-10-CM | POA: Diagnosis not present

## 2018-06-19 DIAGNOSIS — H5461 Unqualified visual loss, right eye, normal vision left eye: Secondary | ICD-10-CM | POA: Diagnosis not present

## 2018-06-19 DIAGNOSIS — E1122 Type 2 diabetes mellitus with diabetic chronic kidney disease: Secondary | ICD-10-CM | POA: Diagnosis not present

## 2018-06-19 DIAGNOSIS — N189 Chronic kidney disease, unspecified: Secondary | ICD-10-CM | POA: Diagnosis not present

## 2018-06-19 DIAGNOSIS — Z87891 Personal history of nicotine dependence: Secondary | ICD-10-CM | POA: Diagnosis not present

## 2018-06-19 DIAGNOSIS — M150 Primary generalized (osteo)arthritis: Secondary | ICD-10-CM | POA: Diagnosis not present

## 2018-06-19 DIAGNOSIS — Z7901 Long term (current) use of anticoagulants: Secondary | ICD-10-CM | POA: Diagnosis not present

## 2018-06-22 DIAGNOSIS — E1140 Type 2 diabetes mellitus with diabetic neuropathy, unspecified: Secondary | ICD-10-CM | POA: Diagnosis not present

## 2018-06-22 DIAGNOSIS — Z87891 Personal history of nicotine dependence: Secondary | ICD-10-CM | POA: Diagnosis not present

## 2018-06-22 DIAGNOSIS — N189 Chronic kidney disease, unspecified: Secondary | ICD-10-CM | POA: Diagnosis not present

## 2018-06-22 DIAGNOSIS — Z7901 Long term (current) use of anticoagulants: Secondary | ICD-10-CM | POA: Diagnosis not present

## 2018-06-22 DIAGNOSIS — H5461 Unqualified visual loss, right eye, normal vision left eye: Secondary | ICD-10-CM | POA: Diagnosis not present

## 2018-06-22 DIAGNOSIS — E1122 Type 2 diabetes mellitus with diabetic chronic kidney disease: Secondary | ICD-10-CM | POA: Diagnosis not present

## 2018-06-22 DIAGNOSIS — Z794 Long term (current) use of insulin: Secondary | ICD-10-CM | POA: Diagnosis not present

## 2018-06-22 DIAGNOSIS — C9211 Chronic myeloid leukemia, BCR/ABL-positive, in remission: Secondary | ICD-10-CM | POA: Diagnosis not present

## 2018-06-22 DIAGNOSIS — M150 Primary generalized (osteo)arthritis: Secondary | ICD-10-CM | POA: Diagnosis not present

## 2018-06-24 ENCOUNTER — Telehealth: Payer: Self-pay

## 2018-06-24 NOTE — Telephone Encounter (Signed)
Call attempted to reschedule 07/08/2018 face to face visit with Dr. Saunders Revel to a telephone or video call due to current clinic policies related to [place] 19 precautions. No answer-unable to leave voicemail message.

## 2018-06-25 DIAGNOSIS — E1140 Type 2 diabetes mellitus with diabetic neuropathy, unspecified: Secondary | ICD-10-CM | POA: Diagnosis not present

## 2018-06-25 DIAGNOSIS — M150 Primary generalized (osteo)arthritis: Secondary | ICD-10-CM | POA: Diagnosis not present

## 2018-06-25 DIAGNOSIS — Z87891 Personal history of nicotine dependence: Secondary | ICD-10-CM | POA: Diagnosis not present

## 2018-06-25 DIAGNOSIS — Z7901 Long term (current) use of anticoagulants: Secondary | ICD-10-CM | POA: Diagnosis not present

## 2018-06-25 DIAGNOSIS — Z794 Long term (current) use of insulin: Secondary | ICD-10-CM | POA: Diagnosis not present

## 2018-06-25 DIAGNOSIS — E1122 Type 2 diabetes mellitus with diabetic chronic kidney disease: Secondary | ICD-10-CM | POA: Diagnosis not present

## 2018-06-25 DIAGNOSIS — C9211 Chronic myeloid leukemia, BCR/ABL-positive, in remission: Secondary | ICD-10-CM | POA: Diagnosis not present

## 2018-06-25 DIAGNOSIS — H5461 Unqualified visual loss, right eye, normal vision left eye: Secondary | ICD-10-CM | POA: Diagnosis not present

## 2018-06-25 DIAGNOSIS — N189 Chronic kidney disease, unspecified: Secondary | ICD-10-CM | POA: Diagnosis not present

## 2018-06-29 IMAGING — CR DG CHEST 2V
2 series · 2 of 2 positions shown · non-contrast
Comparison: 06/20/2016

CLINICAL DATA: Chest pain and shortness of Breath

EXAM:
CHEST  2 VIEW

[chest pa]
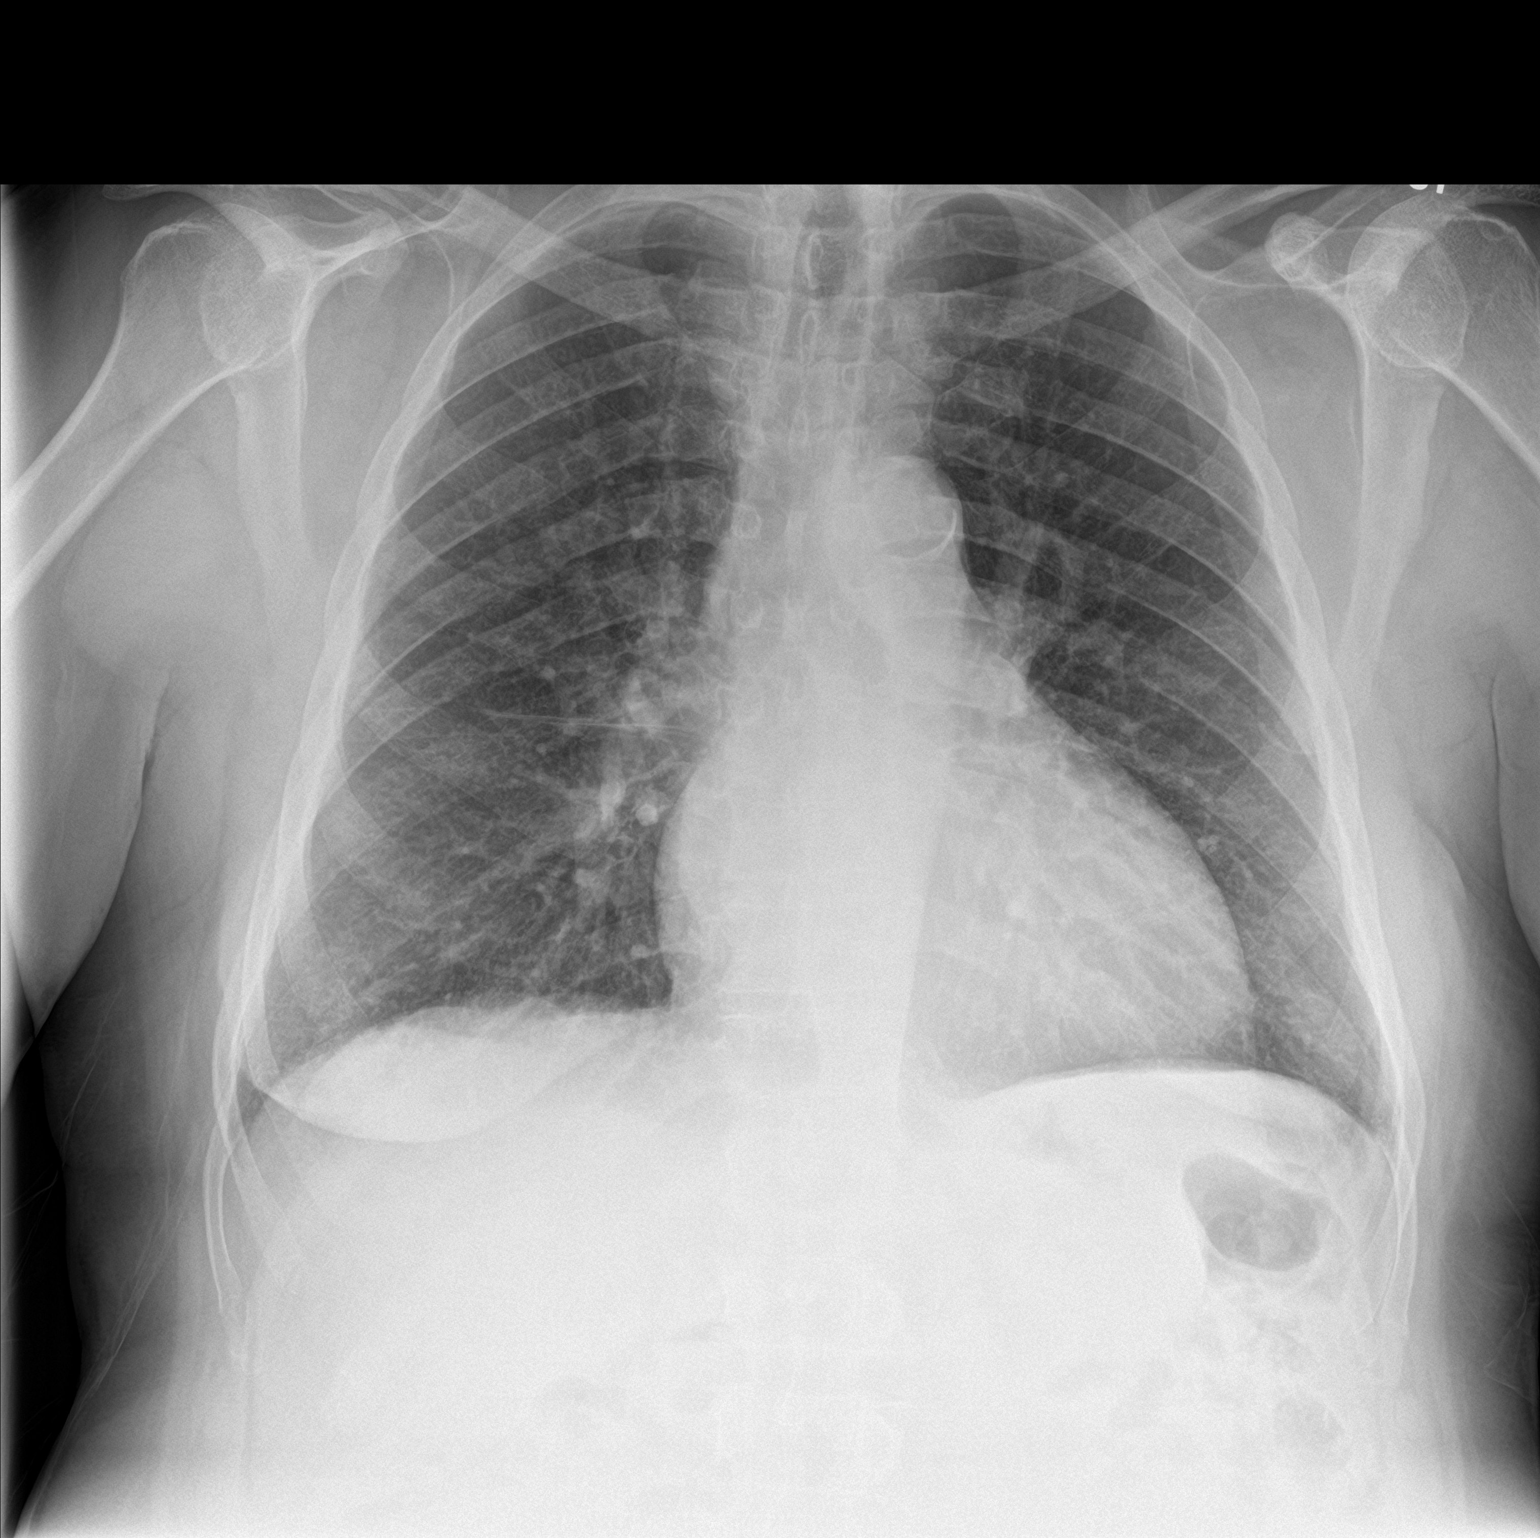

[chest lat]
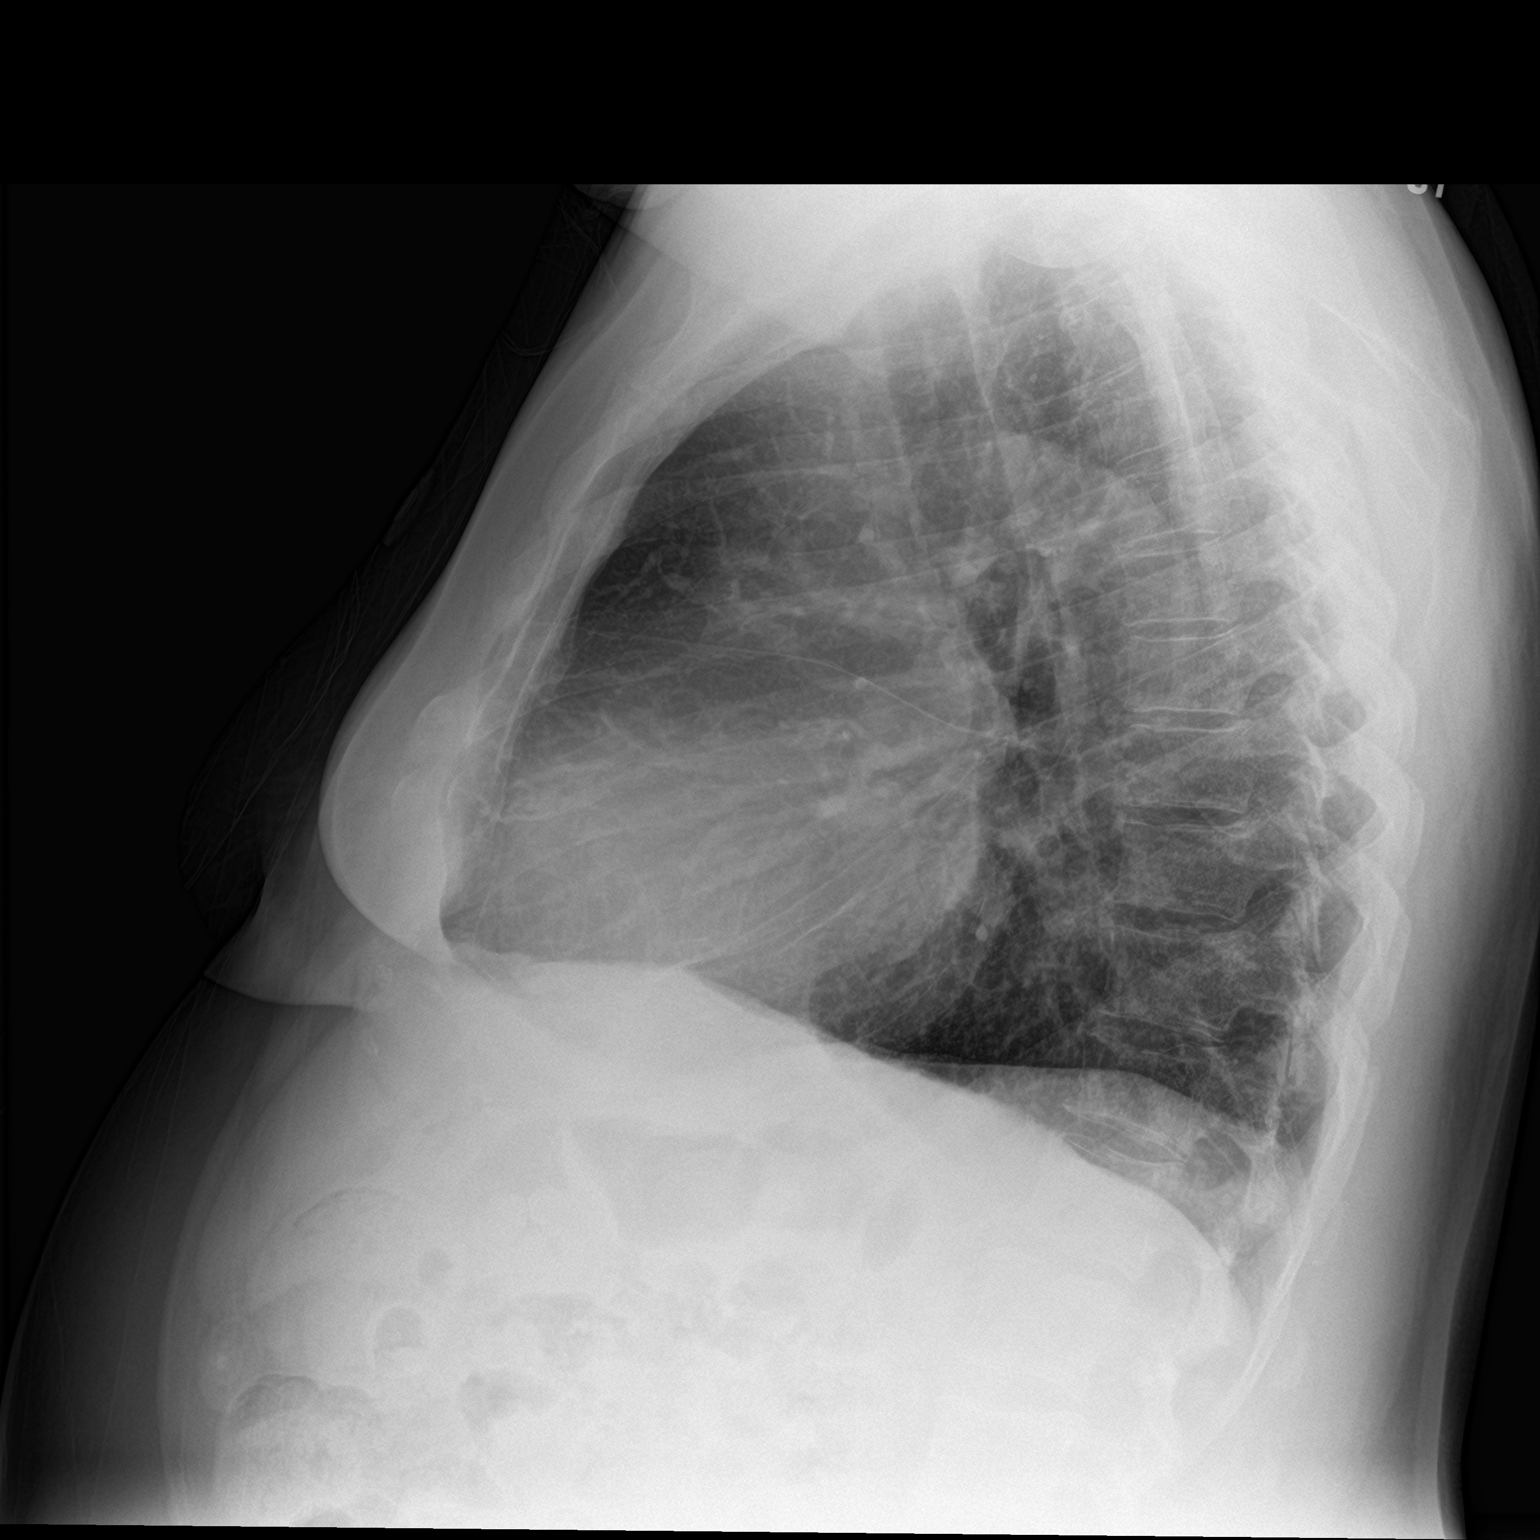

[2 of 2 positions shown; findings below may reference images not displayed]

FINDINGS: Cardiac shadow is stable. Mild vascular congestion is seen. No focal
infiltrate or sizable effusion is noted. No bony abnormality is
seen.
IMPRESSION: Mild vascular congestion.

## 2018-06-30 ENCOUNTER — Telehealth: Payer: Self-pay | Admitting: Family Medicine

## 2018-06-30 ENCOUNTER — Other Ambulatory Visit: Payer: Self-pay | Admitting: Family Medicine

## 2018-06-30 DIAGNOSIS — Z794 Long term (current) use of insulin: Secondary | ICD-10-CM | POA: Diagnosis not present

## 2018-06-30 DIAGNOSIS — E1140 Type 2 diabetes mellitus with diabetic neuropathy, unspecified: Secondary | ICD-10-CM | POA: Diagnosis not present

## 2018-06-30 DIAGNOSIS — E1165 Type 2 diabetes mellitus with hyperglycemia: Principal | ICD-10-CM

## 2018-06-30 DIAGNOSIS — E1121 Type 2 diabetes mellitus with diabetic nephropathy: Secondary | ICD-10-CM

## 2018-06-30 DIAGNOSIS — E1122 Type 2 diabetes mellitus with diabetic chronic kidney disease: Secondary | ICD-10-CM | POA: Diagnosis not present

## 2018-06-30 DIAGNOSIS — M150 Primary generalized (osteo)arthritis: Secondary | ICD-10-CM | POA: Diagnosis not present

## 2018-06-30 DIAGNOSIS — Z7901 Long term (current) use of anticoagulants: Secondary | ICD-10-CM | POA: Diagnosis not present

## 2018-06-30 DIAGNOSIS — Z87891 Personal history of nicotine dependence: Secondary | ICD-10-CM | POA: Diagnosis not present

## 2018-06-30 DIAGNOSIS — C9211 Chronic myeloid leukemia, BCR/ABL-positive, in remission: Secondary | ICD-10-CM | POA: Diagnosis not present

## 2018-06-30 DIAGNOSIS — N189 Chronic kidney disease, unspecified: Secondary | ICD-10-CM | POA: Diagnosis not present

## 2018-06-30 DIAGNOSIS — H5461 Unqualified visual loss, right eye, normal vision left eye: Secondary | ICD-10-CM | POA: Diagnosis not present

## 2018-06-30 DIAGNOSIS — IMO0002 Reserved for concepts with insufficient information to code with codable children: Secondary | ICD-10-CM

## 2018-06-30 NOTE — Telephone Encounter (Signed)
[name], [name], walked in to office today, questions on trulicity lilly cares application.  Referral for med assistance financial application [employer] Trulicity 0.75mg  weekly, as mentioned, he already had a copy of application that was returned to him - it was filled out incorrectly, needs help to complete application, already has samples trulicity for 1 month.   [name], [facility] 06/30/2018, 12:02 PM

## 2018-07-01 ENCOUNTER — Ambulatory Visit: Payer: Self-pay | Admitting: Pharmacist

## 2018-07-01 DIAGNOSIS — E1165 Type 2 diabetes mellitus with hyperglycemia: Principal | ICD-10-CM

## 2018-07-01 DIAGNOSIS — IMO0002 Reserved for concepts with insufficient information to code with codable children: Secondary | ICD-10-CM

## 2018-07-01 DIAGNOSIS — E1121 Type 2 diabetes mellitus with diabetic nephropathy: Secondary | ICD-10-CM

## 2018-07-01 NOTE — Chronic Care Management (AMB) (Signed)
  Care Management Note   Lucas Caldwell is a 85 y.o. year old male who is a primary care patient of [name], DO. The CM team was consulted for assistance with chronic disease management and care coordination, and medication assistance for Trulicity.  I reached out to [name] by phone today. Outreach attempt # 1. Patient does not answer and no voicemail picks up   Follow Up Plan: The CM team will reach out to the patient again over the next 7 days.    [name], PharmD, [place] [employer] [phone]

## 2018-07-03 ENCOUNTER — Telehealth: Payer: Self-pay | Admitting: *Deleted

## 2018-07-03 NOTE — Telephone Encounter (Signed)
Virtual Visit Pre-Appointment Phone Call  Steps For Call:  1. Confirm consent - "In the setting of the current Covid19 crisis, you are scheduled for a (TELEPHONE) visit with your provider on (07/10/2018) at (3:30 PM).  Just as we do with many in-office visits, in order for you to participate in this visit, we must obtain consent.  If you'd like, I can send this to your mychart (if signed up) or email for you to review.  Otherwise, I can obtain your verbal consent now.  All virtual visits are billed to your insurance company just like a normal visit would be.  By agreeing to a virtual visit, we'd like you to understand that the technology does not allow for your provider to perform an examination, and thus may limit your provider's ability to fully assess your condition. If your provider identifies any concerns that need to be evaluated in person, we will make arrangements to do so.  Finally, though the technology is pretty good, we cannot assure that it will always work on either your or our end, and in the setting of a video visit, we may have to convert it to a phone-only visit.  In either situation, we cannot ensure that we have a secure connection.  Are you willing to proceed?" YES 2. Confirm the BEST phone number to call the day of the visit by including in appointment notes  3. Give patient instructions for WebEx/MyChart download to smartphone as below or Doximity/Doxy.me if video visit (depending on what platform provider is using)  4. Advise patient to be prepared with their blood pressure, heart rate, weight, any heart rhythm information, their current medicines, and a piece of paper and pen handy for any instructions they may receive the day of their visit  5. Inform patient they will receive a phone call 15 minutes prior to their appointment time (may be from unknown caller ID) so they should be prepared to answer  6. Confirm that appointment type is correct in Epic appointment notes  (VIDEO vs PHONE)     TELEPHONE CALL NOTE  Lucas Caldwell has been deemed a candidate for a follow-up tele-health visit to limit community exposure during the Covid-19 pandemic. I spoke with the patient via phone to ensure availability of phone/video source, confirm preferred email & phone number, and discuss instructions and expectations.  I reminded Lucas Caldwell to be prepared with any vital sign and/or heart rhythm information that could potentially be obtained via home monitoring, at the time of his visit. I reminded Lucas Caldwell to expect a phone call at the time of his visit if his visit.  ,  C, CMA 07/03/2018 1:08 PM   INSTRUCTIONS FOR DOWNLOADING THE WEBEX APP TO SMARTPHONE  - If Apple, ask patient to go to [employer] and type in WebEx in the search bar. [employer], the blue/green circle. If Android, go to [employer] and type in [employer] in the search bar. The app is free but as with any other app downloads, their phone may require them to verify saved payment information or Apple/Android password.  - The patient does NOT have to create an account. - On the day of the visit, the assist will walk the patient through joining the meeting with the meeting number/password.  INSTRUCTIONS FOR DOWNLOADING THE MYCHART APP TO SMARTPHONE  - The patient must first make sure to have activated MyChart and know their login information - If Apple, go  to [employer] and type in MyChart in the search bar and download the app. If Android, ask patient to go to [employer] and type in [place] in the search bar and download the app. The app is free but as with any other app downloads, their phone may require them to verify saved payment information or Apple/Android password.  - The patient will need to then log into the app with their MyChart username and password, and select [place] as their healthcare provider to link the account. When it is time for  your visit, go to the MyChart app, find appointments, and click Begin Video Visit. Be sure to Select Allow for your device to access the Microphone and Camera for your visit. You will then be connected, and your provider will be with you shortly.  **If they have any issues connecting, or need assistance please contact MyChart service desk [phone])**  **If using a computer, in order to ensure the best quality for their visit they will need to use either of the following Internet Browsers: [employer], or Google Chrome**  IF USING DOXIMITY or DOXY.ME - The patient will receive a link just prior to their visit, either by text or email (to be determined day of appointment depending on if it's doxy.me or Doximity).     FULL LENGTH CONSENT FOR TELE-HEALTH VISIT   I hereby voluntarily request, consent and authorize [facility] and its employed or contracted physicians, physician assistants, nurse practitioners or other licensed health care professionals (the Practitioner), to provide me with telemedicine health care services (the "Services") as deemed necessary by the treating Practitioner. I acknowledge and consent to receive the Services by the Practitioner via telemedicine. I understand that the telemedicine visit will involve communicating with the Practitioner through live audiovisual communication technology and the disclosure of certain medical information by electronic transmission. I acknowledge that I have been given the opportunity to request an in-person assessment or other available alternative prior to the telemedicine visit and am voluntarily participating in the telemedicine visit.  I understand that I have the right to withhold or withdraw my consent to the use of telemedicine in the course of my care at any time, without affecting my right to future care or treatment, and that the Practitioner or I may terminate the telemedicine visit at any time. I understand that I  have the right to inspect all information obtained and/or recorded in the course of the telemedicine visit and may receive copies of available information for a reasonable fee.  I understand that some of the potential risks of receiving the Services via telemedicine include:  [name] Delay or interruption in medical evaluation due to technological equipment failure or disruption; . Information transmitted may not be sufficient (e.g. poor resolution of images) to allow for appropriate medical decision making by the Practitioner; and/or  . In rare instances, security protocols could fail, causing a breach of personal health information.  Furthermore, I acknowledge that it is my responsibility to provide information about my medical history, conditions and care that is complete and accurate to the best of my ability. I acknowledge that Practitioner's advice, recommendations, and/or decision may be based on factors not within their control, such as incomplete or inaccurate data provided by me or distortions of diagnostic images or specimens that may result from electronic transmissions. I understand that the practice of medicine is not an exact science and that Practitioner makes no warranties or guarantees regarding treatment outcomes. I acknowledge  that I will receive a copy of this consent concurrently upon execution via email to the email address I last provided but may also request a printed copy by calling the office of [facility].    I understand that my insurance will be billed for this visit.   I have read or had this consent read to me. . I understand the contents of this consent, which adequately explains the benefits and risks of the Services being provided via telemedicine.  . I have been provided ample opportunity to ask questions regarding this consent and the Services and have had my questions answered to my satisfaction. . I give my informed consent for the services to be provided through the  use of telemedicine in my medical care  By participating in this telemedicine visit I agree to the above.

## 2018-07-06 ENCOUNTER — Telehealth: Payer: Self-pay

## 2018-07-06 NOTE — Telephone Encounter (Signed)
1. Do you currently have a fever? No 2. Have you recently travelled on a cruise, internationally, or to [place], [place], [place], [place], [place], or [place], [place] [employer]) ? No 3. Have you been in contact with someone that is currently pending confirmation of Covid19 testing or has been confirmed to have the [place] virus?  No 4. Are you currently experiencing fatigue or cough? No  Spoke w/ pt's wife. Advised that we are restricting visitors at this time and anyone present in the vehicle should meet the above criteria as well. Advised that visit will be at curbside for finger stick ONLY and will receive call with instructions. Pt also advised to please bring own pen for signature of arrival document.

## 2018-07-07 ENCOUNTER — Ambulatory Visit: Payer: Self-pay | Admitting: Pharmacist

## 2018-07-07 DIAGNOSIS — E1122 Type 2 diabetes mellitus with diabetic chronic kidney disease: Secondary | ICD-10-CM | POA: Diagnosis not present

## 2018-07-07 DIAGNOSIS — Z794 Long term (current) use of insulin: Secondary | ICD-10-CM | POA: Diagnosis not present

## 2018-07-07 DIAGNOSIS — IMO0002 Reserved for concepts with insufficient information to code with codable children: Secondary | ICD-10-CM

## 2018-07-07 DIAGNOSIS — H5461 Unqualified visual loss, right eye, normal vision left eye: Secondary | ICD-10-CM | POA: Diagnosis not present

## 2018-07-07 DIAGNOSIS — M150 Primary generalized (osteo)arthritis: Secondary | ICD-10-CM | POA: Diagnosis not present

## 2018-07-07 DIAGNOSIS — N189 Chronic kidney disease, unspecified: Secondary | ICD-10-CM | POA: Diagnosis not present

## 2018-07-07 DIAGNOSIS — C9211 Chronic myeloid leukemia, BCR/ABL-positive, in remission: Secondary | ICD-10-CM | POA: Diagnosis not present

## 2018-07-07 DIAGNOSIS — Z87891 Personal history of nicotine dependence: Secondary | ICD-10-CM | POA: Diagnosis not present

## 2018-07-07 DIAGNOSIS — Z7901 Long term (current) use of anticoagulants: Secondary | ICD-10-CM | POA: Diagnosis not present

## 2018-07-07 DIAGNOSIS — E1121 Type 2 diabetes mellitus with diabetic nephropathy: Secondary | ICD-10-CM

## 2018-07-07 DIAGNOSIS — E1140 Type 2 diabetes mellitus with diabetic neuropathy, unspecified: Secondary | ICD-10-CM | POA: Diagnosis not present

## 2018-07-07 DIAGNOSIS — E1165 Type 2 diabetes mellitus with hyperglycemia: Principal | ICD-10-CM

## 2018-07-07 NOTE — Patient Instructions (Signed)
Thank you allowing the Chronic Care Management Team to be a part of your care! It was a pleasure speaking with you today!     CCM (Chronic Care Management) Team    Janci Minor RN, BSN Nurse Care Coordinator  [phone]   [name] PharmD  Clinical Pharmacist  [phone]   Eula Fried LCSW Clinical Social Worker [phone]  Visit Information  Goals Addressed            This Visit's Progress   . Medication Assistance       Current Barriers:  . financial  Pharmacist Clinical Goal(s): Over the next 14 days, Mr.. Imburgia will provide the necessary supplementary documents (proof of household income) needed for medication assistance applications to CCM pharmacist.   Interventions: . CCM pharmacist will apply for medication assistance program for Trulicity made by [employer].   Patient Self Care Activities:  [name] Gather necessary documents needed to apply for medication assistance  Initial goal documentation         Mr. Vandervelden was given information about Chronic Care Management services today including:  1. CCM service includes personalized support from designated clinical staff supervised by his physician, including individualized plan of care and coordination with other care providers 2. 24/7 contact phone numbers for assistance for urgent and routine care needs. 3. Service will only be billed when office clinical staff spend 20 minutes or more in a month to coordinate care. 4. Only one practitioner may furnish and bill the service in a calendar month. 5. The patient may stop CCM services at any time (effective at the end of the month) by phone call to the office staff. 6. The patient will be responsible for cost sharing (co-pay) of up to 20% of the service fee (after annual deductible is met).  Patient agreed to services and verbal consent obtained.   The patient verbalized understanding of instructions provided today and declined a print copy of patient  instruction materials.   Telephone follow up appointment with CCM team member scheduled for: 07/09/2018  [name], PharmD, [facility] [phone]

## 2018-07-07 NOTE — Chronic Care Management (AMB) (Signed)
  Chronic Care Management   Note  07/07/2018 Name: Lucas Caldwell MRN: [id number] DOB: 02/18/1934   Subjective:   Lucas Caldwell is a 85 y.o. year old male who is a primary care patient of [name], DO. The CM team was consulted for assistance with chronic disease management and care coordination.   I reached out to [name] by phone today regarding medication assistance for Trulicity  Lucas Caldwell was given information about Chronic Care Management services today including:  1. CCM service includes personalized support from designated clinical staff supervised by his physician, including individualized plan of care and coordination with other care providers 2. 24/7 contact phone numbers for assistance for urgent and routine care needs. 3. Service will only be billed when office clinical staff spend 20 minutes or more in a month to coordinate care. 4. Only one practitioner may furnish and bill the service in a calendar month. 5. The patient may stop CCM services at any time (effective at the end of the month) by phone call to the office staff. 6. The patient will be responsible for cost sharing (co-pay) of up to 20% of the service fee (after annual deductible is met).  Patient agreed to services and verbal consent obtained.   Assessment:   Goals Addressed            This Visit's Progress   . Medication Assistance       Current Barriers:  . financial  Pharmacist Clinical Goal(s): Over the next 14 days, Lucas Caldwell will provide the necessary supplementary documents (proof of household income) needed for medication assistance applications to CCM pharmacist.   Interventions: . CCM pharmacist will apply for medication assistance program for Trulicity made by [employer].   Patient Self Care Activities:  [name] Gather necessary documents needed to apply for medication assistance  Initial goal documentation         Plan:  Telephone follow up appointment  with CCM team member scheduled for: 07/09/2018 at 2 pm  [name], PharmD, [place] [phone]

## 2018-07-08 ENCOUNTER — Ambulatory Visit: Payer: Medicare HMO | Admitting: Internal Medicine

## 2018-07-08 ENCOUNTER — Telehealth: Payer: Self-pay | Admitting: Internal Medicine

## 2018-07-08 ENCOUNTER — Ambulatory Visit: Payer: Self-pay | Admitting: Pharmacist

## 2018-07-08 ENCOUNTER — Ambulatory Visit (INDEPENDENT_AMBULATORY_CARE_PROVIDER_SITE_OTHER): Payer: Medicare HMO

## 2018-07-08 ENCOUNTER — Other Ambulatory Visit: Payer: Self-pay

## 2018-07-08 ENCOUNTER — Other Ambulatory Visit: Payer: Self-pay | Admitting: Family Medicine

## 2018-07-08 DIAGNOSIS — E1121 Type 2 diabetes mellitus with diabetic nephropathy: Secondary | ICD-10-CM

## 2018-07-08 DIAGNOSIS — I480 Paroxysmal atrial fibrillation: Secondary | ICD-10-CM | POA: Diagnosis not present

## 2018-07-08 DIAGNOSIS — E1165 Type 2 diabetes mellitus with hyperglycemia: Principal | ICD-10-CM

## 2018-07-08 DIAGNOSIS — Z7901 Long term (current) use of anticoagulants: Secondary | ICD-10-CM | POA: Diagnosis not present

## 2018-07-08 DIAGNOSIS — IMO0002 Reserved for concepts with insufficient information to code with codable children: Secondary | ICD-10-CM

## 2018-07-08 DIAGNOSIS — E1142 Type 2 diabetes mellitus with diabetic polyneuropathy: Secondary | ICD-10-CM

## 2018-07-08 LAB — POCT INR: INR: 2.8 (ref 2.0–3.0)

## 2018-07-08 NOTE — Telephone Encounter (Signed)
Pt c/o BP issue: STAT if pt c/o blurred vision, one-sided weakness or slurred speech  1. What are your last 5 BP readings? Wife unable to give specific number just knows it was low  2. Are you having any other symptoms (ex. Dizziness, headache, blurred vision, passed out)? Dizzy   3. What is your BP issue?  Too low per PT

## 2018-07-08 NOTE — Patient Instructions (Signed)
Thank you allowing the Chronic Care Management Team to be a part of your care! It was a pleasure speaking with you today!     CCM (Chronic Care Management) Team    Janci Minor RN, BSN Nurse Care Coordinator  [phone]   [name] PharmD  Clinical Pharmacist  [phone]   Eula Fried LCSW Clinical Social Worker [phone]  Visit Information  Goals Addressed            This Visit's Progress   . Medication Assistance       Current Barriers:  . Financial . Limited vision  Pharmacist Clinical Goal(s): Over the next 14 days, Mr.. Sluka will provide the necessary supplementary documents (proof of household income) needed for medication assistance applications to CCM pharmacist.   Interventions: . Will collaborate with [facility] CPhT to assist patient with patient assistance application for Trulicity o Let patient's wife know that new [place] application will be mailed to patient as application has been recently updated.  Patient Self Care Activities:  [name] Gather necessary documents needed to apply for medication assistance  Please see past updates related to this goal by clicking on the "Past Updates" button in the selected goal          The patient verbalized understanding of instructions provided today and declined a print copy of patient instruction materials.   Telephone follow up appointment with CCM team member scheduled for: 07/09/2018  [name], PharmD, [facility] [phone]

## 2018-07-08 NOTE — Telephone Encounter (Signed)
Returned call to patients wife to get more information regarding hypotension.   She reported that PT came out on Tuesday and reported BP in 90s. She said he was having "some" dizziness but could not specify further.   Call to [place], PT with [facility] at home. He reported Tuesday am BP was 96/64, HR 80. He denies any sx and said that he walked 600 ft with cane, which is his normal. 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 He did not note pt being unsteady, no SOB or chest pain.   Normal SBP 120s- 130s. Wess felt like he may not be drinking enough. He denies swelling or recently feeling ill.   Routed to provider to advise.

## 2018-07-08 NOTE — Telephone Encounter (Signed)
Please encourage Mr. Paulo to drink a little more water and ensure that he stays well-hydrated, especially if he is working outside.  He should continue to monitor his BP.  If it is less than 100/60 again, he should let us know so that we can consider medication adjustments.  [name], MD [facility] Pager: [phone]

## 2018-07-08 NOTE — Patient Instructions (Signed)
Please continue dosage of 1 TABLET EVERY DAY EXCEPT 1.5 TABLETS ON MONDAYS & FRIDAYS. Recheck INR in 8 weeks.

## 2018-07-08 NOTE — Telephone Encounter (Signed)
Returned call to Ms. Kenton with suggestions from provider, she is agreeable to [place].  Evisit confirmed with wife.

## 2018-07-08 NOTE — Chronic Care Management (AMB) (Signed)
  Chronic Care Management   Follow Up Note   07/08/2018 Name: FISHER HARGADON MRN: [id number] DOB: 1933/07/13  Referred by: [name], DO Reason for referral : Chronic Care Management (Outreach Telephon Call)   ROXAS CLYMER is a 85 y.o. year old male who is a primary care patient of [name], DO. The CCM team was consulted for assistance with chronic disease management and care coordination needs.    I reached out to patient's wife, Zaccheus Edmister, by phone today. Patient has asked that I speak with her regarding his medications and patient assistance application.     Goals Addressed            This Visit's Progress   . Medication Assistance       Current Barriers:  . financial  Pharmacist Clinical Goal(s): Over the next 14 days, Mr.. Worley will provide the necessary supplementary documents (proof of household income) needed for medication assistance applications to CCM pharmacist.   Interventions: . Will collaborate with [facility] CPhT to assist patient with patient assistance application for Trulicity o Let patient's wife know that new [place] application will be mailed to patient as application has been recently updated.  Patient Self Care Activities:  [name] Gather necessary documents needed to apply for medication assistance  Please see past updates related to this goal by clicking on the "Past Updates" button in the selected goal          PLAN  1) I route patient assistance letter to [place] technician who will coordinate patient assistance program application process for medications listed above.  [facility] pharmacy technician will assist with obtaining all required documents from both patient and provider and submit application once completed.  2) Telephone follow up appointment with CCM Pharmacist re-scheduled for: 07/09/18 at 1 pm to complete medication review.  [name], PharmD, [place] [employer] [phone]

## 2018-07-09 ENCOUNTER — Other Ambulatory Visit: Payer: Self-pay

## 2018-07-09 ENCOUNTER — Telehealth: Payer: Medicare HMO

## 2018-07-09 ENCOUNTER — Other Ambulatory Visit: Payer: Self-pay | Admitting: Pharmacy Technician

## 2018-07-09 ENCOUNTER — Ambulatory Visit (INDEPENDENT_AMBULATORY_CARE_PROVIDER_SITE_OTHER): Payer: Medicare HMO | Admitting: Pharmacist

## 2018-07-09 DIAGNOSIS — N183 Chronic kidney disease, stage 3 (moderate): Secondary | ICD-10-CM | POA: Diagnosis not present

## 2018-07-09 DIAGNOSIS — E1169 Type 2 diabetes mellitus with other specified complication: Secondary | ICD-10-CM | POA: Diagnosis not present

## 2018-07-09 DIAGNOSIS — IMO0002 Reserved for concepts with insufficient information to code with codable children: Secondary | ICD-10-CM

## 2018-07-09 DIAGNOSIS — I25119 Atherosclerotic heart disease of native coronary artery with unspecified angina pectoris: Secondary | ICD-10-CM | POA: Diagnosis not present

## 2018-07-09 DIAGNOSIS — E785 Hyperlipidemia, unspecified: Secondary | ICD-10-CM | POA: Diagnosis not present

## 2018-07-09 DIAGNOSIS — E1165 Type 2 diabetes mellitus with hyperglycemia: Secondary | ICD-10-CM

## 2018-07-09 DIAGNOSIS — E1121 Type 2 diabetes mellitus with diabetic nephropathy: Secondary | ICD-10-CM

## 2018-07-09 DIAGNOSIS — J449 Chronic obstructive pulmonary disease, unspecified: Secondary | ICD-10-CM | POA: Diagnosis not present

## 2018-07-09 NOTE — Patient Outreach (Signed)
[facility]) Care Management  07/09/2018  CAIDE CAMPI 1933/08/21 [id number]                           Medication Assistance Referral  Referral From: [facility] RPh)  Medication/Company: Danelle Berry / Ralph Leyden Patient application portion:  Mailed Provider application portion: Faxed  to Dr. Parks Ranger    Follow up:  Will follow up with patient in 5-10 business days to confirm application(s) have been received.  [place] Goh P. Risha Barretta, [place] Management [phone]

## 2018-07-10 ENCOUNTER — Other Ambulatory Visit: Payer: Self-pay | Admitting: Family Medicine

## 2018-07-10 ENCOUNTER — Telehealth (INDEPENDENT_AMBULATORY_CARE_PROVIDER_SITE_OTHER): Payer: Medicare HMO | Admitting: Internal Medicine

## 2018-07-10 ENCOUNTER — Ambulatory Visit: Payer: Self-pay | Admitting: Pharmacist

## 2018-07-10 ENCOUNTER — Encounter: Payer: Self-pay | Admitting: Internal Medicine

## 2018-07-10 ENCOUNTER — Other Ambulatory Visit: Payer: Self-pay

## 2018-07-10 VITALS — Ht 64.0 in | Wt 192.0 lb

## 2018-07-10 DIAGNOSIS — I5032 Chronic diastolic (congestive) heart failure: Secondary | ICD-10-CM

## 2018-07-10 DIAGNOSIS — I25119 Atherosclerotic heart disease of native coronary artery with unspecified angina pectoris: Secondary | ICD-10-CM | POA: Diagnosis not present

## 2018-07-10 DIAGNOSIS — J449 Chronic obstructive pulmonary disease, unspecified: Secondary | ICD-10-CM

## 2018-07-10 DIAGNOSIS — J432 Centrilobular emphysema: Secondary | ICD-10-CM

## 2018-07-10 DIAGNOSIS — I480 Paroxysmal atrial fibrillation: Secondary | ICD-10-CM | POA: Diagnosis not present

## 2018-07-10 MED ORDER — ALBUTEROL SULFATE HFA 108 (90 BASE) MCG/ACT IN AERS: 2.0000 | INHALATION_SPRAY | Freq: Four times a day (QID) | RESPIRATORY_TRACT | 2 refills | Status: DC | PRN

## 2018-07-10 NOTE — Progress Notes (Signed)
Virtual Visit via Telephone Note   This visit type was conducted due to national recommendations for restrictions regarding the COVID-19 Pandemic (e.g. social distancing) in an effort to limit this patient's exposure and mitigate transmission in our community.  Due to his co-morbid illnesses, this patient is at least at moderate risk for complications without adequate follow up.  This format is felt to be most appropriate for this patient at this time.  The patient did not have access to video technology/had technical difficulties with video requiring transitioning to audio format only (telephone).  All issues noted in this document were discussed and addressed.  No physical exam could be performed with this format.  Please refer to the patient's chart for his  consent to telehealth for [facility].   Evaluation Performed:  Follow-up visit  Date:  07/10/2018   ID:  Lucas Caldwell, DOB [birth year], MRN [id number]  Patient Location: Home Provider Location: Office  PCP:  [name], DO  Cardiologist:  [name], MD  Electrophysiologist:  None   Chief Complaint: Cold feeling and chest tightness  History of Present Illness:    Lucas Caldwell is a 85 y.o. male with history of coronary artery disease, mixed ischemic and nonischemic cardiomyopathy, hypertension, hyperlipidemia, type 2 diabetes mellitus, paroxysmal atrial fibrillation, chronic kidney disease stage III, CML, iron deficiency anemia,GI bleed due to duodenal ulcers,BPH, and hypothyroidism.  We are speaking today for follow-up of his shortness of breath.  I saw him on 06/03/2018, at which time he complained of worsening exertional dyspnea, as well as shortness of breath when bending over to tie his shoes.  He denied edema and significant weight gain (though 5 pound weight gain was noted at that visit).  I recommended increasing torsemide to 40 mg BID x 3 days, followed by return to 20 mg BID.  He contacted [place] a  week later to report that his breathing had improved. His weight was also down 5 pounds.  Mr. Arriaga reports that he has actually been feeling fairly well over the last week with minimal shortness of breath.  His weight has been stable around 192 pounds, which he believes is his dry weight.  This afternoon, shortly after eating fried chicken, he began to feel "cold."  He also had vague tightness across his chest.  This prompted him to use his rescue inhaler as well as take a sublingual nitroglycerin and lie down.  Symptoms resolved over the course of about 30 minutes.  He now feels back to baseline.  He has not had any similar episodes in the past.  He denies any recent exertional chest discomfort as well as palpitations.  His wife notes that his blood pressure was low the last few days.  Mr. Billy endorses occasional lightheadedness with that.  He has been compliant with his medications, including warfarin.  He has not had any noticeable bleeding.  He did not check his heart rate or blood pressure today during the aforementioned episode.  The patient does not have symptoms concerning for COVID-19 infection (fever, chills, cough, or new shortness of breath).    Past Medical History:  Diagnosis Date  . Arthritis    "probably in his legs" (08/15/2016)  . Blind right eye   . BPH (benign prostatic hyperplasia)   . Breast asymmetry    Left breast is larger, present for several years.  [name] CAD (coronary artery disease)    a. Cath in the late 90's - reportedly ok;  b.  2014 s/p stenting x 2 @ [facility]; c 08/19/16 Cath/PCI with DES -> RCA, plan to treat LM 70% medically. Seen by surgery and felt to be too high risk for CABG.  . Chronic combined systolic (congestive) and diastolic (congestive) heart failure ([place])    a. Previously reduced EF-->50% by echo in 2012;  b. 06/2015 Echo: EF 50-55%  c. 07/2016 Echo: EF 45-50%; d. 12/2017 Echo: EF 55%, Gr1 DD, mild MR.  . Chronic kidney disease (CKD), stage IV (severe) ([place])    . CML (chronic myelocytic leukemia) ([place])   . GERD (gastroesophageal reflux disease)   . GIB (gastrointestinal bleeding)    a. 12/2017 3 unit PRBC GIB in setting of coumadin-->Endo/colon multiple duodenal ulcers and a single bleeding ulcer in the proximal ascending colon status post hemostatic clipping x2.  . Gout   . Hyperlipemia   . Hypertension   . Hypothyroidism   . Iron deficiency anemia   . Migraine    "in the 1960s" (08/15/2016)  . NICM (nonischemic cardiomyopathy) ([place])    a. Previously reduced EF-->50% by echo in 2012;  b. 06/2015 Echo: EF 50-55%, Gr2 DD, mild MR, mildly dil LA, Ao sclerosis, mild TR;  c. 07/2016 Echo: EF 45-50%; d. 12/2017 Echo: EF 55%, Gr1 DD, mild MR.  [name] PAF (paroxysmal atrial fibrillation) (HCC)    a. ? Dx 2014-->s/p DCCV;  b. CHA2DS2VASc = 6--> Coumadin.  . Prostate cancer ([place])   . RBBB   . Type II diabetes mellitus ([place])    Past Surgical History:  Procedure Laterality Date  . CATARACT EXTRACTION W/ INTRAOCULAR LENS  IMPLANT, BILATERAL Bilateral   . COLONOSCOPY N/A 01/13/2018   Procedure: COLONOSCOPY;  Surgeon: Lin Landsman, MD;  Location: [facility] ENDOSCOPY;  Service: Gastroenterology;  Laterality: N/A;  . COLONOSCOPY WITH PROPOFOL N/A 01/01/2018   Procedure: COLONOSCOPY WITH PROPOFOL;  Surgeon: Lin Landsman, MD;  Location: [facility] ENDOSCOPY;  Service: Gastroenterology;  Laterality: N/A;  . CORONARY ANGIOPLASTY WITH STENT PLACEMENT  09/2012   2 stents  . CORONARY STENT INTERVENTION N/A 08/19/2016   Procedure: Coronary Stent Intervention;  Surgeon: [name], MD;  Location: [place] CV LAB;  Service: Cardiovascular;  Laterality: N/A;  . ESOPHAGEAL MANOMETRY N/A 12/16/2017   Procedure: ESOPHAGEAL MANOMETRY (EM);  Surgeon: Lin Landsman, MD;  Location: [facility] ENDOSCOPY;  Service: Gastroenterology;  Laterality: N/A;  . ESOPHAGOGASTRODUODENOSCOPY N/A 12/20/2016   Procedure: ESOPHAGOGASTRODUODENOSCOPY (EGD);  Surgeon: Lin Landsman,  MD;  Location: [facility] ENDOSCOPY;  Service: Gastroenterology;  Laterality: N/A;  . ESOPHAGOGASTRODUODENOSCOPY N/A 01/13/2018   Procedure: ESOPHAGOGASTRODUODENOSCOPY (EGD);  Surgeon: Lin Landsman, MD;  Location: [facility] ENDOSCOPY;  Service: Gastroenterology;  Laterality: N/A;  . ESOPHAGOGASTRODUODENOSCOPY (EGD) WITH PROPOFOL N/A 11/03/2017   Procedure: ESOPHAGOGASTRODUODENOSCOPY (EGD) WITH PROPOFOL;  Surgeon: Lin Landsman, MD;  Location: [facility] ENDOSCOPY;  Service: Gastroenterology;  Laterality: N/A;  . EYE SURGERY    . HERNIA REPAIR     "nav[place]"  . LEFT HEART CATH AND CORONARY ANGIOGRAPHY N/A 08/14/2016   Procedure: Left Heart Cath and Coronary Angiography;  Surgeon: [name], MD;  Location: [place] [facility];  Service: Cardiovascular;  Laterality: N/A;  . TOE AMPUTATION Right    "big toe"     Current Meds  Medication Sig  . acetaminophen (TYLENOL) 500 MG tablet Take 500 mg by mouth every 6 (six) hours as needed.  [name] albuterol (VENTOLIN HFA) 108 (90 Base) MCG/ACT inhaler Inhale 2 puffs into the lungs every 6 (six) hours as needed for  wheezing or shortness of breath.  . Alcohol Swabs 70 % PADS Use when checking blood sugar up to 3x daily  . allopurinol (ZYLOPRIM) 100 MG tablet Take 1 tablet (100 mg total) by mouth daily.  . Blood Glucose Monitoring Suppl (TRUE METRIX AIR GLUCOSE METER) DEVI 1 Device by Does not apply route 2 (two) times daily.  . bosutinib (BOSULIF) 100 MG tablet Take 100 mg by mouth at bedtime. Take with food.   [name] BREO ELLIPTA 100-25 MCG/INH AEPB INHALE 1 PUFF BY MOUTH INTO THE LUNGS ONCE A DAY  . Dulaglutide (TRULICITY) 1.61 WR/6.0AV SOPN Inject 0.75 mg into the skin once a week.  . enalapril (VASOTEC) 5 MG tablet Take 1 tablet (5 mg total) by mouth daily.  [name] gabapentin (NEURONTIN) 300 MG capsule TAKE 2 CAPSULES BY MOUTH EVERY MORNING, 1 CAPSULE IN AFTERNOON, 2 CAPS AT BEDTIME. IN FUTURE INCREASE UP TO 3 CAPS 3 TIMES A DAY  . glucose blood (TRUE METRIX BLOOD  GLUCOSE TEST) test strip Use as instructed  . Insulin Pen Needle 31G X 5 MM MISC 31 g by Does not apply route as directed.  . isosorbide mononitrate (IMDUR) 30 MG 24 hr tablet Take 0.5 tablets (15 mg total) by mouth daily. (Patient taking differently: Take 30 mg by mouth daily. )  . LEVEMIR FLEXTOUCH 100 UNIT/ML Pen INJECT 60 UNITS SUBCUTANEOUSLY INTO THE SKIN ONCE A DAY  . levothyroxine (SYNTHROID, LEVOTHROID) 25 MCG tablet Take 1 tablet (25 mcg total) by mouth daily before breakfast.  . loperamide (IMODIUM A-D) 2 MG tablet Take 2 mg by mouth 3 (three) times daily as needed for diarrhea or loose stools.   . metoprolol succinate (TOPROL XL) 25 MG 24 hr tablet Take 1 tablet (25 mg total) by mouth daily.  . Multiple Vitamins-Minerals (MULTIVITAMIN ADULTS 50+ PO) Take 1 tablet by mouth every morning.  . nepafenac (NEVANAC) 0.1 % ophthalmic suspension Place 1 drop into both eyes 3 (three) times daily as needed.  . nitroGLYCERIN (NITROSTAT) 0.4 MG SL tablet Place 1 tablet (0.4 mg total) under the tongue every 5 (five) minutes x 3 doses as needed for chest pain.  [name] omeprazole (PRILOSEC) 40 MG capsule TAKE ONE CAPSULE BY MOUTH TWICE DAILY  . torsemide (DEMADEX) 20 MG tablet Take 1 tablet (20 mg total) by mouth 2 (two) times daily.  [name] warfarin (COUMADIN) 3 MG tablet Take 1 tablet (3 mg total) by mouth daily. Take as directed by the coumadin clinic.     Allergies:   Clopidogrel; Ciprofloxacin; Mirtazapine; and Spironolactone   Social History   Tobacco Use  . Smoking status: Former Smoker    Packs/day: 2.00    Years: 15.00    Pack years: 30.00    Types: Cigarettes  . Smokeless tobacco: Former Systems developer  . Tobacco comment: quit 1979  Substance Use Topics  . Alcohol use: No    Comment: previously drank heavily - quit 1979.  . Drug use: No     Family Hx: The patient's family history includes Brain cancer in his father; Diabetes in his sister; Heart attack in his sister.  ROS:   Please see the  history of present illness.   All other systems reviewed and are negative.   Prior CV studies:   The following studies were reviewed today:  TTE (01/12/2018): Normal LV size.  LVEF 55% with grade 1 diastolic dysfunction.  Mild mitral regurgitation.  PCI (08/19/2016): Moderate to severe proximal RCA disease with focal 95% stenosis at the  proximal margin of previously placed mid RCA stent.  Successful PCI to proximal/mid RCA using Synergy 3.0 x 28 mm drug-eluting stent.  LHC (08/14/2016): Multivessel CAD with 60 to 70% ostial left main stenosis and 95% proximal/mid RCA lesion at the proximal margin of old stent.  Patent stents in the LCx and RCA.  Upper normal LVEDP.  Labs/Other Tests and Data Reviewed:    EKG:  No ECG reviewed.  Recent Labs: 01/11/2018: Magnesium 1.7 04/22/2018: ALT 10 06/03/2018: BNP 83.0; BUN 16; Creatinine, Ser 1.33; Hemoglobin 11.5; Platelets 193; Potassium 3.8; Sodium 140   Recent Lipid Panel Lab Results  Component Value Date/Time   CHOL 99 01/08/2017 04:45 PM   TRIG 118 01/08/2017 04:45 PM   HDL 21 (L) 01/08/2017 04:45 PM   CHOLHDL 4.7 01/08/2017 04:45 PM   LDLCALC 54 01/08/2017 04:45 PM    Wt Readings from Last 3 Encounters:  07/10/18 192 lb (87.1 kg)  06/09/18 198 lb (89.8 kg)  06/03/18 203 lb (92.1 kg)     Objective:    Vital Signs:  Ht 5\' 4"  (1.626 m)   Wt 192 lb (87.1 kg)   BMI 32.96 kg/m    VITAL SIGNS:  reviewed  ASSESSMENT & PLAN:    Coronary artery disease with angina: Mr. Westmoreland has multivessel CAD but has done relatively well since his PCI to the RCA in 2018.  We have been medically managing his moderate to severe left main disease.  It is hard to know exactly what his transient episode of "feeling cold" and associated chest tightness represents.  He does not believe it was related to having just eaten chicken, though he continues to have intermittent dysphasia.  His symptoms improved with use of his rescue inhaler and a single dose of  sublingual nitroglycerin.  He has not had any other chest pain for quite some time.  His breathing has also improved with diuresis after our last visit.  I will defer medication changes and additional testing right now.  However, I advised Mr. Fratus to call 911 if he has any recurrence of the symptoms.  We will readdress the need for ischemia evaluation when we speak again in a week.  Chronic HFpEF: Mr. Hinojosa volume status has improved with brief escalation of torsemide.  [name] now back to torsemide 20 mg twice daily and maintaining his dry weight around 192 pounds.  He has stable NYHA class II-III symptoms.  Due to his soft blood pressure with associated lightheadedness, I have recommended discontinuation of enalapril.  Isosorbide mononitrate and metoprolol should be continued.  Paroxysmal atrial fibrillation: Mr. Cuffe denies palpitations, though I wonder if his "cold" feeling and chest tightness may have represented transient atrial fibrillation with rapid ventricular response.  He now feels back to baseline and otherwise has been well.  I would not change his metoprolol dose at this time.  Mr. Wolk should continue anticoagulation with warfarin.  COVID-19 Education: The signs and symptoms of COVID-19 were discussed with the patient and how to seek care for testing (follow up with PCP or arrange E-visit).  The importance of social distancing was discussed today.  Time:   Today, I have spent 15 minutes with the patient with telehealth technology discussing the above problems.  An additional 10 minutes were spent reviewing the patient's chart and documenting today's visit.   Medication Adjustments/Labs and Tests Ordered: Current medicines are reviewed at length with the patient today.  Concerns regarding medicines are outlined above.   Tests Ordered:  None.  Medication Changes: Stop taking enalapril.  Disposition:  Follow up in 1 week(s)  Signed, [name], MD  07/10/2018  3:24 PM    [facility]

## 2018-07-10 NOTE — Chronic Care Management (AMB) (Addendum)
Chronic Care Management   Follow Up Note   07/10/2018 Name: Lucas Caldwell MRN: [id number] DOB: 12-08-1933  Referred by: [name], DO Reason for referral : Chronic Care Management (Patient Phone Call)   Lucas Caldwell is a 85 y.o. year old male who is a primary care patient of [name], DO. The CCM team was consulted for assistance with chronic disease management and care coordination needs.    I reached out to Lucas Caldwell by phone today. Speak with Lucas Caldwell and Lucas Caldwell. Patient reports that his wife manages his medications for him.  Review of patient status, including review of consultants reports, relevant laboratory and other test results, and collaboration with appropriate care team members and the patient's provider was performed as part of comprehensive patient evaluation and provision of chronic care management services.     Objective: Lab Results  Component Value Date   CREATININE 1.33 (H) 06/03/2018   CREATININE 1.33 (H) 04/22/2018   CREATININE 1.01 01/14/2018  CrCl (Using adjusted BW): ~43 mL/min   Lab Results  Component Value Date   HGBA1C 8.7 (A) 05/06/2018    Lipid Panel     Component Value Date/Time   CHOL 99 01/08/2017 1645   TRIG 118 01/08/2017 1645   HDL 21 (L) 01/08/2017 1645   CHOLHDL 4.7 01/08/2017 1645   VLDL 24 01/08/2017 1645   LDLCALC 54 01/08/2017 1645    BP Readings from Last 3 Encounters:  06/09/18 111/70  06/03/18 118/68  06/01/18 136/75    Allergies  Allergen Reactions  . Clopidogrel Anaphylaxis    Other reaction(s): ANAPHYLAXIS;altered mental status;  (electrolytes "out of wack" and confusion per family; THEY DO NOT REMEMBER ANY OTHER REACTION)- MSB 10/10/15  . Ciprofloxacin Itching  . Mirtazapine Diarrhea    Bad dreams Bad dreams  . Spironolactone     Other reaction(s): Other (See Comments) gynecomastia    Medications Reviewed Today    Reviewed by Vella Raring, [facility]  (Pharmacist) on 07/09/18 at 1335  Med List Status: <None>  Medication Order Taking? Sig Documenting Provider Last Dose Status Informant  acetaminophen (TYLENOL) 500 MG tablet [id number] Yes Take 500 mg by mouth every 6 (six) hours as needed. [provider] Taking Active Pharmacy Records  albuterol (PROVENTIL HFA;VENTOLIN HFA) 108 (90 Base) MCG/ACT inhaler [id number] Yes Inhale 2 puffs into the lungs every 6 (six) hours as needed for wheezing or shortness of breath. Theora Gianotti, NP Taking Active Pharmacy Records  Alcohol Swabs 70 % PADS [id number]  Use when checking blood sugar up to 3x daily [name], DO  Active Pharmacy Records  allopurinol (ZYLOPRIM) 100 MG tablet [id number] Yes Take 1 tablet (100 mg total) by mouth daily. [name], DO Taking Active   Blood Glucose Monitoring Suppl (TRUE METRIX AIR GLUCOSE METER) DEVI [id number]  1 Device by Does not apply route 2 (two) times daily. [name], DO  Active   bosutinib (BOSULIF) 100 MG tablet [id number] Yes Take 100 mg by mouth at bedtime. Take with food.  [provider] Taking Active Pharmacy Records           Med Note [facility], [facility] A   Thu Jul 09, 2018  1:14 PM)    Lucas Caldwell 100-25 MCG/INH AEPB [id number] Yes INHALE 1 PUFF BY MOUTH INTO THE LUNGS ONCE A DAY Karamalegos, [name], DO Taking Active   Dulaglutide (TRULICITY) 5.57 DU/2.0UR SOPN [id number] Yes Inject 0.75 mg into the skin  once a week. [name], DO Taking Active Pharmacy Records           Med Note [facility], [facility] A   Thu Jul 09, 2018  1:22 PM) On Saturdays  enalapril (VASOTEC) 5 MG tablet [id number] Yes Take 1 tablet (5 mg total) by mouth daily. [name], DO Taking Active Pharmacy Records  gabapentin (NEURONTIN) 300 MG capsule [id number] Yes TAKE 2 CAPSULES BY MOUTH EVERY MORNING, 1 CAPSULE IN AFTERNOON, 2 CAPS AT BEDTIME. IN FUTURE INCREASE UP TO 3 CAPS 3 TIMES A DAY  Karamalegos, Alexander Lenna Sciara, DO Taking Active   glucose blood (TRUE METRIX BLOOD GLUCOSE TEST) test strip [id number]  Use as instructed [name], DO  Active   Insulin Pen Needle 31G X 5 MM MISC [id number]  31 g by Does not apply route as directed. [name], NP  Active Pharmacy Records  isosorbide mononitrate (IMDUR) 30 MG 24 hr tablet [id number] Yes Take 0.5 tablets (15 mg total) by mouth daily. Theora Gianotti, NP Taking Active   LEVEMIR FLEXTOUCH 100 UNIT/ML Pen [id number] Yes INJECT 60 UNITS SUBCUTANEOUSLY INTO THE SKIN ONCE A DAY Karamalegos, [name], DO Taking Active            Med Note [facility], Kail Fraley A   Thu Jul 09, 2018  1:21 PM) In the morning  levothyroxine (SYNTHROID, LEVOTHROID) 25 MCG tablet [id number] Yes Take 1 tablet (25 mcg total) by mouth daily before breakfast. [name], DO Taking Active   loperamide (IMODIUM A-D) 2 MG tablet [id number] No Take 2 mg by mouth 3 (three) times daily as needed for diarrhea or loose stools.  [provider] Not Taking Active Pharmacy Records  metoprolol succinate (TOPROL XL) 25 MG 24 hr tablet [id number] Yes Take 1 tablet (25 mg total) by mouth daily. End, [name], MD Taking Active   Multiple Vitamins-Minerals (MULTIVITAMIN ADULTS 50+ PO) [id number] Yes Take 1 tablet by mouth every morning. [provider] Taking Active Pharmacy Records  nepafenac [facility]) 0.1 % ophthalmic suspension [id number] Yes Place 1 drop into both eyes 3 (three) times daily as needed. [provider] Taking Active Pharmacy Records  nitroGLYCERIN (NITROSTAT) 0.4 MG SL tablet [id number] Yes Place 1 tablet (0.4 mg total) under the tongue every 5 (five) minutes x 3 doses as needed for chest pain. Theora Gianotti, NP Taking Active   omeprazole (PRILOSEC) 40 MG capsule [id number] Yes TAKE ONE CAPSULE BY MOUTH TWICE DAILY Vanga, Tally Due, MD Taking Active   torsemide (DEMADEX) 20 MG tablet  [id number] Yes Take 1 tablet (20 mg total) by mouth 2 (two) times daily. Theora Gianotti, NP Taking Active   warfarin (COUMADIN) 3 MG tablet [id number] Yes Take 1 tablet (3 mg total) by mouth daily. Take as directed by the coumadin clinic. [name], MD Taking Active            Assessment:     Goals Addressed            This Visit's Progress   . Medication Assistance       Current Barriers:  . Financial . Limited vision  Pharmacist Clinical Goal(s): Over the next 14 days, Lucas Caldwell will provide the necessary supplementary documents (proof of household income) needed for medication assistance applications to CCM pharmacist.   Interventions: . Perform comprehensive medication review with patient and wife o Counsel on difference between long acting inhaler, Breo, and rescue inhaler, Ventolin - Counsel patient on rinsing mouth out  after each use of Breo - Ventolin inhaler expired and current prescription expired. . Current Nitrostat is expired. New prescription sent to pharmacy in February. Patient and wife counseled to get Nitrostat refill.. . Lucas Caldwell confirms patient's current warfarin dose, same as in note from most recent Anti-coag visit in chart (4/22). Confirms that she follows calendar as provided at each [facility] visit. . Reports that patient's current bosutinib, as prescribed by Oncologist is 100 mg once daily. Perform review of [facility] chart record to confirm and update local medication list accordingly. . Note patient is not currently on a statin.  o Perform chart review-per note in chart from Cardiologist, [name], NP, from 04/22/18, Cardiologist planned to hold atorvastatin for 2 weeks at this time due to several month history of myalgias. o Note from Dr. Parks Ranger on 05/06/18, indicates patient reported that aches in body arms and legs seemed slightly improved off of the medication. PCP advised follow up with Cardiologist about  consideration of trying a lower dose statin to see if tolerated. o Will collaborate with Cardiology  Report that they use a weekly pillbox to organize Lucas Caldwell' medications.  CrCl (Using adjusted BW): ~43 mL/min: Package insert for gabapentin recommends maximum total maintenance dose of 1400 mg/day for CrCl 30-59 mL/min . Counsel patient and wife on importance of monitoring patient's blood sugar as directed. o Denies any low blood sugars  Date Fasting Blood Glucose  18 - April 160  19 - April 178  20 - April 150  21 - April 131  22 - April 172  23 - April 178  Average 162 .  Medication Assistance o Continue to collaborate with [facility] CPhT to assist patient with patient assistance application for Trulicity - Patient has samples from PCP. Reports has two more pens left, for 4/25 and 5/2 - will ask PCP about another sample pen or two to use while completing paperwork. o Patient and wife report difficulty with affording Breo inhaler as well. [employer] patient assistance program - [place] - Patient has partial extra help - Reports meets income requirement - Unknown if patient meets out of pocket expenditure requirement for program   Patient Self Care Activities:  [name] Gather necessary documents needed to apply for medication assistance . Check blood sugar and blood pressure as directed by providers . Takes medications, with assistance of wife, as directed  Please see past updates related to this goal by clicking on the "Past Updates" button in the selected goal          Plan  1) Sent message to patient's PCP to request a new prescription for an albuterol inhaler be sent to his pharmacy  2) Will follow up with patient's Cardiologist regarding whether it would be appropriate to retry patient on a lower dose statin.  3) Will follow up with PCP to:  A) Recommend provider consider reducing patient's gabapentin dose to gabapentin 600 mg BID, based on patient's most recent renal lab results  B)  Determine if the office is able to obtain further Trulicity 8.41 mg sample pens for the patient.  4) Will collaborate with patient's local pharmacy to confirm that the patient has an active Nitrostat prescription and to determine how much patient has spent out of pocket for the calendar year (for patient assistance assessment).  5) CM Pharmacist will reach out to the patient again over the next 3 days.   [name], PharmD, [place] [employer] [phone]

## 2018-07-10 NOTE — Patient Instructions (Signed)
Medication Instructions:  Your physician has recommended you make the following change in your medication:  1- STOP Enalapril.   If you need a refill on your cardiac medications before your next appointment, please call your pharmacy.   Lab work: none If you have labs (blood work) drawn today and your tests are completely normal, you will receive your results only by: [name] MyChart Message (if you have MyChart) OR . A paper copy in the mail If you have any lab test that is abnormal or we need to change your treatment, we will call you to review the results.  Testing/Procedures: none  Follow-Up: At [facility], you and your health needs are our priority.  As part of our continuing mission to provide you with exceptional heart care, we have created designated Provider Care Teams.  These Care Teams include your primary Cardiologist (physician) and Advanced Practice Providers (APPs -  Physician Assistants and Nurse Practitioners) who all work together to provide you with the care you need, when you need it. You will need a follow up appointment in 1 weeks Virtual with Dr End or APP.  Please call our office 2 months in advance to schedule this appointment.  You may see [name], MD or one of the following Advanced Practice Providers on your designated Care Team:   [name], NP [name], PA-C . [name], PA-C  Any Other Special Instructions Will Be Listed Below (If Applicable).  Please continue to monitor your blood pressure and heart rate.  If you continue to have lightheadedness or blood pressure is less than 100/60, please let us know.  If you have anymore chest tightness, please call 911.

## 2018-07-11 NOTE — Chronic Care Management (AMB) (Signed)
  Chronic Care Management   Follow Up Note   07/11/2018 Name: Lucas Caldwell MRN: [id number] DOB: September 29, 1933  Referred by: [name], DO Reason for referral : Chronic Care Management (Patient Phone Call)   Lucas Caldwell is a 85 y.o. year old male who is a primary care patient of [name], DO. The CCM team was consulted for assistance with chronic disease management and care coordination needs.    I reached out to patient's [place] and then to Lucas Caldwell and his wife by phone today.  Review of patient status, including review of consultants reports, relevant laboratory and other test results, and collaboration with appropriate care team members and the patient's provider was performed as part of comprehensive patient evaluation and provision of chronic care management services.    Goals Addressed            This Visit's Progress   . Medication Assistance       Current Barriers:  . Financial . Limited vision  Pharmacist Clinical Goal(s): Over the next 14 days, Lucas Caldwell will provide the necessary supplementary documents (proof of household income) needed for medication assistance applications to CCM pharmacist.   Interventions: . Collaborate with Museum/gallery conservator at [place] that patient has an active prescription for Nitrostat sublingual tablets and request that this be refilled for the patient. o Request patient's current out of pocket expenditure amount for the calendar year to determine his eligibility for the [place] patient assistance program for Breo . Medication Assistance o Continue to collaborate with [facility] CPhT to assist patient with patient assistance application for Trulicity o Patient and wife report difficulty with affording Breo inhaler as well. [employer] patient assistance program - [place] - Patient has partial extra help - Reports meets income requirement - Out of pocket expenditure requirement ($600): Not  met  Follow up with patient's wife, Lucas Caldwell  Mrs. Tat confirms that she has picked up both the new albuterol inhaler and Nitrostat sublingual tablets prescriptions from the patient's pharmacy   Patient Self Care Activities:  [name] Gather necessary documents needed to apply for medication assistance . Check blood sugar and blood pressure as directed by providers . Takes medications, with assistance of wife, as directed  Please see past updates related to this goal by clicking on the "Past Updates" button in the selected goal          Plan  1) Will collaborate with patient's PCP to determine if appropriate to switch patient from [facility] inhaler to [facility] inhaler, as patient is currently able to apply for the [facility] assistance program through [employer], which does not have an out of pocket expenditure requirement.  2) The CM team will reach out to the patient again over the next 7 days.   Lucas Caldwell, PharmD, [place] [employer] [phone]

## 2018-07-11 NOTE — Patient Instructions (Signed)
Thank you allowing the Chronic Care Management Team to be a part of your care! It was a pleasure speaking with you today!     CCM (Chronic Care Management) Team    Janci Minor RN, BSN Nurse Care Coordinator  [phone]   [name] PharmD  Clinical Pharmacist  [phone]   Eula Fried LCSW Clinical Social Worker [phone]  Visit Information  Goals Addressed            This Visit's Progress   . Medication Assistance       Current Barriers:  . Financial . Limited vision  Pharmacist Clinical Goal(s): Over the next 14 days, Mr. Lengacher will provide the necessary supplementary documents (proof of household income) needed for medication assistance applications to CCM pharmacist.   Interventions: . Collaborate with Museum/gallery conservator at [place] that patient has an active prescription for Nitrostat sublingual tablets and request that this be refilled for the patient. o Request patient's current out of pocket expenditure amount for the calendar year to determine his eligibility for the [place] patient assistance program for Breo . Medication Assistance o Continue to collaborate with [facility] CPhT to assist patient with patient assistance application for Trulicity o Patient and wife report difficulty with affording Breo inhaler as well. [employer] patient assistance program - [place] - Patient has partial extra help - Reports meets income requirement - Out of pocket expenditure requirement ($600): Not met  Follow up with patient's wife, Mrs. Lauricella  Mrs. Burleson confirms that she has picked up both the new albuterol inhaler and Nitrostat sublingual tablets prescriptions from the patient's pharmacy   Patient Self Care Activities:  [name] Gather necessary documents needed to apply for medication assistance . Check blood sugar and blood pressure as directed by providers . Takes medications, with assistance of wife, as directed  Please see past updates related to this goal by  clicking on the "Past Updates" button in the selected goal          The patient verbalized understanding of instructions provided today and declined a print copy of patient instruction materials.   The CM team will reach out to the patient again over the next 7 days.   [name], PharmD, [place] [employer] [phone]

## 2018-07-11 NOTE — Patient Instructions (Signed)
Thank you allowing the Chronic Care Management Team to be a part of your care! It was a pleasure speaking with you today!     CCM (Chronic Care Management) Team    Janci Minor RN, BSN Nurse Care Coordinator  [phone]   [name] PharmD  Clinical Pharmacist  [phone]   Eula Fried LCSW Clinical Social Worker [phone]  Visit Information  Goals Addressed            This Visit's Progress   . Medication Assistance       Current Barriers:  . Financial . Limited vision  Pharmacist Clinical Goal(s): Over the next 14 days, [name] will provide the necessary supplementary documents (proof of household income) needed for medication assistance applications to CCM pharmacist.   Interventions: . Perform comprehensive medication review with patient and wife o Counsel on difference between long acting inhaler, Breo, and rescue inhaler, Ventolin - Counsel patient on rinsing mouth out after each use of Breo - Ventolin inhaler expired and current prescription expired. . Current Nitrostat is expired. New prescription sent to pharmacy in February. Patient and wife counseled to get Nitrostat refill.. . [name] confirms patient's current warfarin dose, same as in note from most recent Anti-coag visit in chart (4/22). Confirms that she follows calendar as provided at each [facility] visit. . Reports that patient's current bosutinib, as prescribed by Oncologist is 100 mg once daily. Perform review of [facility] chart record to confirm and update local medication list accordingly. . Note patient is not currently on a statin.  o Perform chart review-per note in chart from Cardiologist, [name], NP, from 04/22/18, Cardiologist planned to hold atorvastatin for 2 weeks at this time due to several month history of myalgias. o Note from Dr. Parks Ranger on 05/06/18, indicates patient reported that aches in body arms and legs seemed slightly improved off of the  medication. PCP advised follow up with Cardiologist about consideration of trying a lower dose statin to see if tolerated. o Will collaborate with Cardiology  Report that they use a weekly pillbox to organize Mr. Winsett' medications.  Cockcroft & Gault (Using adjusted BW): ~43 mL/min: Package insert for gabapentin recommends maximum total maintenance dose of 1400 mg/day for CrCl 30-59 mL/min . Counsel patient and wife on importance of monitoring patient's blood sugar as directed. o Denies any low blood sugars  Date Fasting Blood Glucose  18 - April 160  19 - April 178  20 - April 150  21 - April 131  22 - April 172  23 - April 178  Average 162 .  Medication Assistance o Continue to collaborate with [facility] CPhT to assist patient with patient assistance application for Trulicity - Patient has samples from PCP. Reports has two more pens left, for 4/25 and 5/2 - will ask PCP about another sample pen or two to use while completing paperwork. o Patient and wife report difficulty with affording Breo inhaler as well. [employer] patient assistance program - [place] - Patient has partial extra help - Reports meets income requirement - Unknown if patient meets out of pocket expenditure requirement for program   Patient Self Care Activities:  [name] Gather necessary documents needed to apply for medication assistance . Check blood sugar and blood pressure as directed by providers . Takes medications, with assistance of wife, as directed  Please see past updates related to this goal by clicking on the "Past Updates" button in the selected goal  The patient verbalized understanding of instructions provided today and declined a print copy of patient instruction materials.   The CM team will reach out to the patient again over the next 3 days.   [name], PharmD, [place] [employer] [phone]

## 2018-07-12 ENCOUNTER — Telehealth: Payer: Self-pay | Admitting: Family Medicine

## 2018-07-12 DIAGNOSIS — E1142 Type 2 diabetes mellitus with diabetic polyneuropathy: Secondary | ICD-10-CM

## 2018-07-12 NOTE — Telephone Encounter (Signed)
Reviewed recommendations from CCM telephone encounter done by [name], [facility].  1) Would the office be able to obtain another sample pen or two of the Trulicity 8.92 mg for this patient have while we are working on the assistance paperwork?  - Yes, will check availability of Trulicity sample in office, if need we can request an order for more.  2) Would you consider reducing Lucas Caldwell' gabapentin dose to 600 mg BID? Based on his last creatinine level from 06/03/18, I calculate his CrCl to be ~43 mL/min (using adjusted BW due to BMI 33). Gabapentin package insert recommends maximum total maintenance dose of 1400 mg/day for CrCl 30-59 mL/min  - Agree. Changed dosing on rx in chart. No new medicine ordered.  3) Would you consider switching patient from [facility] inhaler to [facility] as patient does not currently meet the requirements for the [facility] assistance program, but is able to apply for the [facility] assistance program through [employer]?  - Yes, we can change from [facility] to [facility].  [name], [facility] 07/12/2018, 5:07 PM

## 2018-07-13 ENCOUNTER — Other Ambulatory Visit: Payer: Self-pay | Admitting: Pharmacy Technician

## 2018-07-13 ENCOUNTER — Ambulatory Visit: Payer: Self-pay | Admitting: Pharmacist

## 2018-07-13 DIAGNOSIS — J449 Chronic obstructive pulmonary disease, unspecified: Secondary | ICD-10-CM

## 2018-07-13 DIAGNOSIS — E1169 Type 2 diabetes mellitus with other specified complication: Secondary | ICD-10-CM | POA: Diagnosis not present

## 2018-07-13 DIAGNOSIS — E1165 Type 2 diabetes mellitus with hyperglycemia: Secondary | ICD-10-CM | POA: Diagnosis not present

## 2018-07-13 DIAGNOSIS — I25119 Atherosclerotic heart disease of native coronary artery with unspecified angina pectoris: Secondary | ICD-10-CM

## 2018-07-13 DIAGNOSIS — E1121 Type 2 diabetes mellitus with diabetic nephropathy: Secondary | ICD-10-CM | POA: Diagnosis not present

## 2018-07-13 DIAGNOSIS — N183 Chronic kidney disease, stage 3 unspecified: Secondary | ICD-10-CM

## 2018-07-13 DIAGNOSIS — E785 Hyperlipidemia, unspecified: Secondary | ICD-10-CM | POA: Diagnosis not present

## 2018-07-13 NOTE — Patient Outreach (Signed)
[facility]) Care Management  07/13/2018  Lucas Caldwell May 28, 1933 [id number]   Incoming call received from [facility] in regards to patient's [employer] application for [employer].  Spoke to patient's wife Lucas Caldwell. HIPAA identifiers verified. [name] called to inform me that she had received the application. We went over the application together. Informed patient that the application along with house hold proof of income, such as both of their social security statements, would need to be mailed back to me in the envelope that was provided. Patient verbalized understanding.  Will followup with patient in 10-14 business days if application has not been received back.  Lucas Caldwell P. Valla Pacey, [place] Management [phone]

## 2018-07-14 ENCOUNTER — Telehealth: Payer: Self-pay | Admitting: Family Medicine

## 2018-07-14 ENCOUNTER — Telehealth: Payer: Self-pay | Admitting: *Deleted

## 2018-07-14 ENCOUNTER — Other Ambulatory Visit: Payer: Self-pay | Admitting: Family Medicine

## 2018-07-14 ENCOUNTER — Other Ambulatory Visit: Payer: Self-pay | Admitting: Pharmacy Technician

## 2018-07-14 DIAGNOSIS — J432 Centrilobular emphysema: Secondary | ICD-10-CM

## 2018-07-14 DIAGNOSIS — M150 Primary generalized (osteo)arthritis: Secondary | ICD-10-CM | POA: Diagnosis not present

## 2018-07-14 DIAGNOSIS — C9211 Chronic myeloid leukemia, BCR/ABL-positive, in remission: Secondary | ICD-10-CM | POA: Diagnosis not present

## 2018-07-14 DIAGNOSIS — Z87891 Personal history of nicotine dependence: Secondary | ICD-10-CM | POA: Diagnosis not present

## 2018-07-14 DIAGNOSIS — E1122 Type 2 diabetes mellitus with diabetic chronic kidney disease: Secondary | ICD-10-CM | POA: Diagnosis not present

## 2018-07-14 DIAGNOSIS — N189 Chronic kidney disease, unspecified: Secondary | ICD-10-CM | POA: Diagnosis not present

## 2018-07-14 DIAGNOSIS — H5461 Unqualified visual loss, right eye, normal vision left eye: Secondary | ICD-10-CM | POA: Diagnosis not present

## 2018-07-14 DIAGNOSIS — Z7901 Long term (current) use of anticoagulants: Secondary | ICD-10-CM | POA: Diagnosis not present

## 2018-07-14 DIAGNOSIS — E1140 Type 2 diabetes mellitus with diabetic neuropathy, unspecified: Secondary | ICD-10-CM | POA: Diagnosis not present

## 2018-07-14 DIAGNOSIS — Z79899 Other long term (current) drug therapy: Secondary | ICD-10-CM

## 2018-07-14 DIAGNOSIS — Z794 Long term (current) use of insulin: Secondary | ICD-10-CM | POA: Diagnosis not present

## 2018-07-14 DIAGNOSIS — E785 Hyperlipidemia, unspecified: Secondary | ICD-10-CM

## 2018-07-14 MED ORDER — ROSUVASTATIN CALCIUM 5 MG PO TABS: 5.0000 mg | ORAL_TABLET | Freq: Every day | ORAL | 3 refills | Status: DC

## 2018-07-14 NOTE — Patient Instructions (Signed)
Thank you allowing the Chronic Care Management Team to be a part of your care! It was a pleasure speaking with you today!     CCM (Chronic Care Management) Team    Janci Minor RN, BSN Nurse Care Coordinator  [phone]   [name] PharmD  Clinical Pharmacist  [phone]   Eula Fried LCSW Clinical Social Worker [phone]  Visit Information  Goals Addressed            This Visit's Progress   . Medication Assistance       Current Barriers:  . Financial . Limited vision  Pharmacist Clinical Goal(s): Over the next 14 days, Mr. Langenderfer will provide the necessary supplementary documents (proof of household income) needed for medication assistance applications to CCM pharmacist.   Interventions: . Collaborate with patient's PCP regarding renal dose adjustment of patient's gabapentin.  o Counsel Mrs. Fujiwara that per PCP patient's gabapentin 300 mg dose to be changed to: 2 tablets (600 mg) by mouth twice daily based on patient's renal function.  - Wife/caregiver demonstrates understanding via teach back method. o Medication list in chart has already been updated by provider. . Medication Assistance o Continue to collaborate with [facility] CPhT to assist patient with patient assistance application for Trulicity - Collaborate with patient's PCP regarding availability of additional Trulicity sample pen for patient. o Patient and wife report difficulty with affording Breo inhaler as well. - Collaborate with PCP, who agrees to change to [facility] to allow patient to apply for patient assistance    Patient Self Care Activities:  [name] Gather necessary documents needed to apply for medication assistance . Check blood sugar and blood pressure as directed by providers . Takes medications, with assistance of wife, as directed  Please see past updates related to this goal by clicking on the "Past Updates" button in the selected goal          The patient verbalized  understanding of instructions provided today and declined a print copy of patient instruction materials.   The CM team will reach out to the patient again over the next 7 days.   [name], PharmD, [place] [employer] [phone]

## 2018-07-14 NOTE — Chronic Care Management (AMB) (Signed)
  Chronic Care Management   Follow Up Note   07/14/2018 Name: HANI CAMPUSANO MRN: [id number] DOB: 08-31-1933  Referred by: [name], DO Reason for referral : Chronic Care Management (Patient Phone Call) and Care Coordination   TAHJAY BINION is a 85 y.o. year old male who is a primary care patient of [name], DO. The CCM team was consulted for assistance with chronic disease management and care coordination needs.    I reached out to [name], patient's wife and caregiver, today regarding medication management and medication assistance.  Review of patient status, including review of consultants reports, relevant laboratory and other test results, and collaboration with appropriate care team members and the patient's provider was performed as part of comprehensive patient evaluation and provision of chronic care management services.    Goals Addressed            This Visit's Progress   . Medication Assistance       Current Barriers:  . Financial . Limited vision  Pharmacist Clinical Goal(s): Over the next 14 days, Mr. Gram will provide the necessary supplementary documents (proof of household income) needed for medication assistance applications to CCM pharmacist.   Interventions: . Collaborate with Cardiologist, [name], regarding whether patient can be restarted on a lower dose statin. o Receive message back from provider stating "It would be reasonable to try him on rosuvastatin 5mg  daily for starters" Cardiology office to order and contact patient. o Will collaborate with patient and Anti-coag Clinic to make sure that clinic is aware of the new medication once ordered. [name] Collaborate with patient's PCP regarding renal dose adjustment of patient's gabapentin.  o Counsel Mrs. Guldin that per PCP patient's gabapentin 300 mg dose to be changed to: 2 tablets (600 mg) by mouth twice daily based on patient's renal function.  -  Wife/caregiver demonstrates understanding via teach back method. o Medication list in chart has already been updated by provider. . Medication Assistance o Continue to collaborate with [facility] CPhT to assist patient with patient assistance application for Trulicity - Collaborate with patient's PCP regarding availability of additional Trulicity sample pen for patient. o Patient and wife report difficulty with affording Breo inhaler as well. - Collaborate with PCP, who agrees to change to [facility] to allow patient to apply for patient assistance    Patient Self Care Activities:  [name] Gather necessary documents needed to apply for medication assistance . Check blood sugar and blood pressure as directed by providers . Takes medications, with assistance of wife, as directed  Please see past updates related to this goal by clicking on the "Past Updates" button in the selected goal          Plan  1) I will route patient assistance letter to [place] technician who will coordinate patient assistance program application process for [facility], in addition to [place].  [facility] pharmacy technician will assist with obtaining all required documents from both patient and provider and submit application once completed.  2) The CM team will reach out to the patient again over the next 7 days.   [name], PharmD, [place] [employer] [phone]

## 2018-07-14 NOTE — Progress Notes (Signed)
Virtual Visit via Telephone Note   This visit type was conducted due to national recommendations for restrictions regarding the COVID-19 Pandemic (e.g. social distancing) in an effort to limit this patient's exposure and mitigate transmission in our community.  Due to his co-morbid illnesses, this patient is at least at moderate risk for complications without adequate follow up.  This format is felt to be most appropriate for this patient at this time.  The patient did not have access to video technology/had technical difficulties with video requiring transitioning to audio format only (telephone).  All issues noted in this document were discussed and addressed.  No physical exam could be performed with this format.  Please refer to the patient's chart for his  consent to telehealth for [facility].   Evaluation Performed:  Follow-up visit  Date:  07/17/2018   ID:  Lucas Caldwell, DOB 1934-03-16, MRN [id number]  Patient Location: Home Provider Location: Office  PCP:  [name], DO  Cardiologist:  [name], MD  Electrophysiologist:  None   Chief Complaint: Follow-up shortness of breath and chest pain  History of Present Illness:    Lucas Caldwell is a 85 y.o. male with history of coronary artery disease, mixed ischemic and nonischemic cardiomyopathy, hypertension, hyperlipidemia, type 2 diabetes mellitus, paroxysmal atrial fibrillation, chronic kidney disease stage III, CML, iron deficiency anemia,GI bleed due to duodenal ulcers,BPH, and hypothyroidism.  We had a televisit on 07/10/2018.  Lucas Caldwell had been feeling well but had a "cold sensation" with associated vague tightness across his chest that afternoon.  Symptoms resolved after using his rescue inhaler and a single dose of sublingual nitroglycerin.  He also reported that his blood pressure had been running low for a few days with associated lightheadedness.  I recommended discontinuation of enalapril.  No  other medication changes were made.  Today, Lucas Caldwell reports he is feeling relatively well.  He has not had any further episodes of chest pain or "cold sensation" since we spoke the last time.  His breathing has also improved.  He wonders if not shaking his inhalers before using them may have contributed to his aforementioned symptoms.  He denies edema and orthopnea, though his weight has increased to 195 pounds.  [name] urinating frequently with his regimen of torsemide 20 mg twice daily.  He has not had any bleeding nor lightheadedness.  He reports checking his blood pressure once at the pharmacy last week; systolic reading was 128 mmHg.  He does not check it regularly at home.  The patient does not have symptoms concerning for COVID-19 infection (fever, chills, cough, or new shortness of breath).  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  [name] trying to maintain social distancing.   Past Medical History:  Diagnosis Date   Arthritis    "probably in his legs" (08/15/2016)   Blind right eye    BPH (benign prostatic hyperplasia)    Breast asymmetry    Left breast is larger, present for several years.   CAD (coronary artery disease)    a. Cath in the late 90's - reportedly ok;  b. 2014 s/p stenting x 2 @ [facility]; c 08/19/16 Cath/PCI with DES -> RCA, plan to treat LM 70% medically. Seen by surgery and felt to be too high risk for CABG.   Chronic combined systolic (congestive) and diastolic (congestive) heart failure ([place])    a. Previously reduced EF-->50% by echo in 2012;  b. 06/2015  Echo: EF 50-55%  c. 07/2016 Echo: EF 45-50%; d. 12/2017 Echo: EF 55%, Gr1 DD, mild MR.   Chronic kidney disease (CKD), stage IV (severe) (HCC)    CML (chronic myelocytic leukemia) (HCC)    GERD (gastroesophageal reflux disease)    GIB (gastrointestinal bleeding)    a. 12/2017 3 unit PRBC GIB in setting of coumadin-->Endo/colon multiple duodenal ulcers and a single bleeding ulcer in the  proximal ascending colon status post hemostatic clipping x2.   Gout    Hyperlipemia    Hypertension    Hypothyroidism    Iron deficiency anemia    Migraine    "in the 1960s" (08/15/2016)   NICM (nonischemic cardiomyopathy) ([place])    a. Previously reduced EF-->50% by echo in 2012;  b. 06/2015 Echo: EF 50-55%, Gr2 DD, mild MR, mildly dil LA, Ao sclerosis, mild TR;  c. 07/2016 Echo: EF 45-50%; d. 12/2017 Echo: EF 55%, Gr1 DD, mild MR.   PAF (paroxysmal atrial fibrillation) (HCC)    a. ? Dx 2014-->s/p DCCV;  b. CHA2DS2VASc = 6--> Coumadin.   Prostate cancer ([place])    RBBB    Type II diabetes mellitus ([place])    Past Surgical History:  Procedure Laterality Date   CATARACT EXTRACTION W/ INTRAOCULAR LENS  IMPLANT, BILATERAL Bilateral    COLONOSCOPY N/A 01/13/2018   Procedure: COLONOSCOPY;  Surgeon: Lin Landsman, MD;  Location: [facility] ENDOSCOPY;  Service: Gastroenterology;  Laterality: N/A;   COLONOSCOPY WITH PROPOFOL N/A 01/01/2018   Procedure: COLONOSCOPY WITH PROPOFOL;  Surgeon: Lin Landsman, MD;  Location: [facility] ENDOSCOPY;  Service: Gastroenterology;  Laterality: N/A;   CORONARY ANGIOPLASTY WITH STENT PLACEMENT  09/2012   2 stents   CORONARY STENT INTERVENTION N/A 08/19/2016   Procedure: Coronary Stent Intervention;  Surgeon: [name], MD;  Location: [place] CV LAB;  Service: Cardiovascular;  Laterality: N/A;   ESOPHAGEAL MANOMETRY N/A 12/16/2017   Procedure: ESOPHAGEAL MANOMETRY (EM);  Surgeon: Lin Landsman, MD;  Location: [facility] ENDOSCOPY;  Service: Gastroenterology;  Laterality: N/A;   ESOPHAGOGASTRODUODENOSCOPY N/A 12/20/2016   Procedure: ESOPHAGOGASTRODUODENOSCOPY (EGD);  Surgeon: Lin Landsman, MD;  Location: [facility] ENDOSCOPY;  Service: Gastroenterology;  Laterality: N/A;   ESOPHAGOGASTRODUODENOSCOPY N/A 01/13/2018   Procedure: ESOPHAGOGASTRODUODENOSCOPY (EGD);  Surgeon: Lin Landsman, MD;  Location: [facility] ENDOSCOPY;  Service:  Gastroenterology;  Laterality: N/A;   ESOPHAGOGASTRODUODENOSCOPY (EGD) WITH PROPOFOL N/A 11/03/2017   Procedure: ESOPHAGOGASTRODUODENOSCOPY (EGD) WITH PROPOFOL;  Surgeon: Lin Landsman, MD;  Location: [facility] ENDOSCOPY;  Service: Gastroenterology;  Laterality: N/A;   EYE SURGERY     HERNIA REPAIR     "navel"   LEFT HEART CATH AND CORONARY ANGIOGRAPHY N/A 08/14/2016   Procedure: Left Heart Cath and Coronary Angiography;  Surgeon: [name], MD;  Location: [place] [facility] LAB;  Service: Cardiovascular;  Laterality: N/A;   TOE AMPUTATION Right    "big toe"     Current Meds  Medication Sig   acetaminophen (TYLENOL) 500 MG tablet Take 500 mg by mouth every 6 (six) hours as needed.   Alcohol Swabs 70 % PADS Use when checking blood sugar up to 3x daily   allopurinol (ZYLOPRIM) 100 MG tablet Take 1 tablet (100 mg total) by mouth daily.   Blood Glucose Monitoring Suppl (TRUE METRIX AIR GLUCOSE METER) DEVI 1 Device by Does not apply route 2 (two) times daily.   bosutinib (BOSULIF) 100 MG tablet Take 100 mg by mouth at bedtime. Take with food.    Dulaglutide (TRULICITY) 7.25 DG/6.4QI SOPN Inject  0.75 mg into the skin once a week.   gabapentin (NEURONTIN) 300 MG capsule Take 2 capsules (600 mg total) by mouth 2 (two) times daily. Max dose = 1200mg  daily for kidney function   glucose blood (TRUE METRIX BLOOD GLUCOSE TEST) test strip Use as instructed   Insulin Pen Needle 31G X 5 MM MISC 31 g by Does not apply route as directed.   isosorbide mononitrate (IMDUR) 30 MG 24 hr tablet Take 0.5 tablets (15 mg total) by mouth daily. (Patient taking differently: Take 30 mg by mouth daily. )   LEVEMIR FLEXTOUCH 100 UNIT/ML Pen INJECT 60 UNITS SUBCUTANEOUSLY INTO THE SKIN ONCE A DAY   levothyroxine (SYNTHROID, LEVOTHROID) 25 MCG tablet Take 1 tablet (25 mcg total) by mouth daily before breakfast.   loperamide (IMODIUM A-D) 2 MG tablet Take 2 mg by mouth 3 (three) times daily as needed  for diarrhea or loose stools.    metoprolol succinate (TOPROL XL) 25 MG 24 hr tablet Take 1 tablet (25 mg total) by mouth daily.   mometasone-formoterol (DULERA) 200-5 MCG/ACT AERO Inhale 2 puffs into the lungs 2 (two) times a day.   Multiple Vitamins-Minerals (MULTIVITAMIN ADULTS 50+ PO) Take 1 tablet by mouth every morning.   nepafenac (NEVANAC) 0.1 % ophthalmic suspension Place 1 drop into both eyes 3 (three) times daily as needed.   nitroGLYCERIN (NITROSTAT) 0.4 MG SL tablet Place 1 tablet (0.4 mg total) under the tongue every 5 (five) minutes x 3 doses as needed for chest pain.   omeprazole (PRILOSEC) 40 MG capsule TAKE ONE CAPSULE BY MOUTH TWICE DAILY   PROVENTIL HFA 108 (90 Base) MCG/ACT inhaler Inhale 2 puffs into the lungs every 6 (six) hours as needed for wheezing or shortness of breath.   rosuvastatin (CRESTOR) 5 MG tablet Take 1 tablet (5 mg total) by mouth daily.   torsemide (DEMADEX) 20 MG tablet Take 1 tablet (20 mg total) by mouth 2 (two) times daily.   warfarin (COUMADIN) 3 MG tablet Take 1 tablet (3 mg total) by mouth daily. Take as directed by the coumadin clinic.     Allergies:   Clopidogrel; Ciprofloxacin; Mirtazapine; and Spironolactone   Social History   Tobacco Use   Smoking status: Former Smoker    Packs/day: 2.00    Years: 15.00    Pack years: 30.00    Types: Cigarettes   Smokeless tobacco: Former Systems developer   Tobacco comment: quit 1979  Substance Use Topics   Alcohol use: No    Comment: previously drank heavily - quit 1979.   Drug use: No     Family Hx: The patient's family history includes Brain cancer in his father; Diabetes in his sister; Heart attack in his sister.  ROS:   Please see the history of present illness.   All other systems reviewed and are negative.   Prior CV studies:   The following studies were reviewed today:  TTE (01/12/2018): Normal LV size.  LVEF 55% with grade 1 diastolic dysfunction.  Mild mitral  regurgitation.  PCI (08/19/2016): Moderate to severe proximal RCA disease with focal 95% stenosis at the proximal margin of previously placed mid RCA stent.  Successful PCI to proximal/mid RCA using Synergy 3.0 x 28 mm drug-eluting stent.  LHC (08/14/2016): Multivessel CAD with 60 to 70% ostial left main stenosis and 95% proximal/mid RCA lesion at the proximal margin of old stent.  Patent stents in the LCx and RCA.  Upper normal LVEDP.  Labs/Other Tests and Data Reviewed:  EKG:  No ECG reviewed.  Recent Labs: 01/11/2018: Magnesium 1.7 04/22/2018: ALT 10 06/03/2018: BNP 83.0; BUN 16; Creatinine, Ser 1.33; Hemoglobin 11.5; Platelets 193; Potassium 3.8; Sodium 140   Recent Lipid Panel Lab Results  Component Value Date/Time   CHOL 99 01/08/2017 04:45 PM   TRIG 118 01/08/2017 04:45 PM   HDL 21 (L) 01/08/2017 04:45 PM   CHOLHDL 4.7 01/08/2017 04:45 PM   LDLCALC 54 01/08/2017 04:45 PM    Wt Readings from Last 3 Encounters:  07/17/18 195 lb 4 oz (88.6 kg)  07/10/18 192 lb (87.1 kg)  06/09/18 198 lb (89.8 kg)     Objective:    Vital Signs:  Ht 5\' 4"  (1.626 m)    Wt 195 lb 4 oz (88.6 kg)    BMI 33.51 kg/m    VITAL SIGNS:  reviewed  ASSESSMENT & PLAN:    Coronary artery disease with stable angina: No further episodes of chest pain since we spoke last week.  I remain uncertain if what he felt last week was angina, transient arrhythmia, or some noncardiac phenomenon.  We have agreed to continue his medications for secondary prevention and antianginal therapy.  No further work-up at this time, though should he have recurrent symptoms, we will need to consider myocardial perfusion stress testing.  Chronic HFpEF: Weight is gradually drifting up, though Mr. Monger reports stable dyspnea without edema or orthopnea.  I advised him to continue taking torsemide 20 mg twice daily.  However, if his weight goes up any further, I advised him to increase this to 40 mg every morning and 20 mg every  afternoon until his weight returns to his target weight of 190-192 pounds (195 pounds today).  Paroxysmal atrial fibrillation: Mr. Wahlert notes occasional brief palpitations lasting only a few seconds.  There are no associated symptoms.  He has not had documented atrial fibrillation in quite some time.  We will continue warfarin and metoprolol.  Should palpitations become more frequent, we may need to consider having him come in for an EKG +/- event monitor placement.  Hypertension: Recent blood pressure reading at the pharmacy was elevated with systolic measurement of 354 mmHg.  It is unsure how accurate this was.  I would not make any medication changes today but encouraged Mr. Bove to let us know if his readings remain elevated when he checks it again at the pharmacy.  COVID-19 Education: The signs and symptoms of COVID-19 were discussed with the patient and how to seek care for testing (follow up with PCP or arrange E-visit).  The importance of social distancing was discussed today.  Time:   Today, I have spent 10 minutes with the patient with telehealth technology discussing the above problems.     Medication Adjustments/Labs and Tests Ordered: Current medicines are reviewed at length with the patient today.  Concerns regarding medicines are outlined above.   Tests Ordered: None.  Medication Changes: None.  Disposition:  Follow up in 3 month(s)  Signed, [name], MD  07/17/2018 3:51 PM    [facility]

## 2018-07-14 NOTE — Telephone Encounter (Signed)
Patient followed by Lucas Caldwell for med assistance on his Trulicity 0.75mg  they are currently working on application status now.  Patient was recently given samples. He has 1-2 more sample pens left, to last him on last dose 07/18/18.  It was asked if we can provide more samples until his application is complete.  Please notify patient today that they may pick up our last Trulicity 0.75mg  sample box - it has 2 pens in it, good for 2 more weeks. For a dose on 5/9 and 5/16.  Also we would need to order more Trulicity 0.75mg  dose - as we have plenty of 1.5mg  dose. In stock currently.  [name], [facility] 07/14/2018, 12:22 PM

## 2018-07-14 NOTE — Telephone Encounter (Signed)
-----   Message from Theora Gianotti, NP sent at 07/14/2018  9:31 AM EDT ----- Regarding: need statin rx Hi team,  Lucas Caldwell had myalgias that got better off of lipitor.  Would you mind sending in a Rx for crestor 5mg  daily?  F/u lipids/lft's in 8 wks.  Delman Cheadle ----- Message ----- From: Vella Raring, [facility] Sent: 07/13/2018   1:03 PM EDT To: Theora Gianotti, NP  If you would please order the medication and have one of your nurses reach out. I will also be following up with the patient later this week and will confirm that he was able to start it.  Thank you!  Lucas Caldwell ----- Message ----- From: Theora Gianotti, NP Sent: 07/12/2018   3:48 PM EDT To: Vella Raring, RPH  Thanks for the heads up.  It looks like he saw Dr. Saunders Revel twice since then, and it didn't come up.  It would be reasonable to try him on rosuvastatin 5mg  daily for starters.  Is that something that you'd take care of, or do I need to have one of our nurses reach out to him? ----- Message ----- From: Vella Raring, [place]: 07/11/2018  11:24 PM EDT To: Theora Gianotti, NP  Lucas Caldwell,  I performed a medication review for Lucas Caldwell on 07/09/18 and noted that per your note in chart from 04/22/18, you had patient hold atorvastatin for 2 weeks at that time due to several month history of myalgias. Lucas Caldwell had a follow up visit with his PCP on 05/06/18 and at that time, Dr. Parks Ranger wrote that patient reported that aches in body arms and legs seemed slightly improved off of the medication.   I am following up with you to see if you would consider re-trying the patient on a lower dose of statin to see if he would be able to tolerate it?  Thank you!  [name], PharmD, [facility] [employer] [phone]

## 2018-07-14 NOTE — Telephone Encounter (Signed)
Patient advised.

## 2018-07-14 NOTE — Patient Outreach (Signed)
[facility]) Care Management  07/14/2018  Lucas Caldwell [birth year] [id number]                           Medication Assistance Referral  Referral From: [place] (Clinic RPh)  Medication/Company: [name] / [employer] Patient application portion:  Mailed Provider application portion: Interoffice Mailed to Dr. Parks Ranger  Medication/Company: Proventil HFA / [employer]  Patient application portion:  Mailed Provider application portion: Interoffice Mailed to Dr Parks Ranger     Follow up:  Will follow up with patient in 5-10 business days to confirm application(s) have been received.  Lucas Caldwell, [place] [facility] [phone]

## 2018-07-14 NOTE — Telephone Encounter (Signed)
Spoke with patient's wife, ok per DPR. She verbalized understanding to have patient start rosuvastatin 5 mg by mouth once a day. She is aware patient should be fasting and have repeat labs in 8 weeks around June 29th to check cholesterol. Rx sent to pharmacy.

## 2018-07-16 NOTE — Addendum Note (Signed)
Addended by: [name] on: 07/16/2018 01:23 PM   Modules accepted: Orders

## 2018-07-17 ENCOUNTER — Telehealth: Payer: Self-pay | Admitting: *Deleted

## 2018-07-17 ENCOUNTER — Telehealth (INDEPENDENT_AMBULATORY_CARE_PROVIDER_SITE_OTHER): Payer: Medicare HMO | Admitting: Internal Medicine

## 2018-07-17 ENCOUNTER — Encounter: Payer: Self-pay | Admitting: Internal Medicine

## 2018-07-17 ENCOUNTER — Other Ambulatory Visit: Payer: Self-pay

## 2018-07-17 VITALS — Ht 64.0 in | Wt 195.2 lb

## 2018-07-17 DIAGNOSIS — I25118 Atherosclerotic heart disease of native coronary artery with other forms of angina pectoris: Secondary | ICD-10-CM | POA: Diagnosis not present

## 2018-07-17 DIAGNOSIS — I480 Paroxysmal atrial fibrillation: Secondary | ICD-10-CM | POA: Diagnosis not present

## 2018-07-17 DIAGNOSIS — I110 Hypertensive heart disease with heart failure: Secondary | ICD-10-CM

## 2018-07-17 DIAGNOSIS — I5032 Chronic diastolic (congestive) heart failure: Secondary | ICD-10-CM

## 2018-07-17 DIAGNOSIS — I10 Essential (primary) hypertension: Secondary | ICD-10-CM

## 2018-07-17 NOTE — Patient Instructions (Addendum)
Medication Instructions:  Your physician recommends that you continue on your current medications as directed. Please refer to the Current Medication list given to you today.  If your weight increases anymore, then take Torsemide 40 mg in the morning and 20 mg in the evening until your weight is back down to 190-192 pounds. Then take Torsemide 20 mg two times a day as usual.  If you need a refill on your cardiac medications before your next appointment, please call your pharmacy.   Lab work: none If you have labs (blood work) drawn today and your tests are completely normal, you will receive your results only by: [name] MyChart Message (if you have MyChart) OR . A paper copy in the mail If you have any lab test that is abnormal or we need to change your treatment, we will call you to review the results.  Testing/Procedures: none  Follow-Up: At [facility], you and your health needs are our priority.  As part of our continuing mission to provide you with exceptional heart care, we have created designated Provider Care Teams.  These Care Teams include your primary Cardiologist (physician) and Advanced Practice Providers (APPs -  Physician Assistants and Nurse Practitioners) who all work together to provide you with the care you need, when you need it. You will need a follow up appointment in 3 months.  Please call our office 2 months in advance to schedule this appointment.  You may see [name], MD or one of the following Advanced Practice Providers on your designated Care Team:   [name], NP [name], PA-C . [name], PA-C

## 2018-07-17 NOTE — Telephone Encounter (Signed)
Called patient and he verbalized understanding of AVS from virtual visit today including to do the following if his weight increases anymore, then take Torsemide 40 mg in the morning and 20 mg in the evening until his weight is back down to 190-192 pounds. Then take Torsemide 20 mg two times a day as usual. [name] aware to call us in June to schedule 3 month follow up.

## 2018-07-20 ENCOUNTER — Other Ambulatory Visit: Payer: Self-pay | Admitting: Pharmacy Technician

## 2018-07-20 ENCOUNTER — Ambulatory Visit: Payer: Medicare HMO | Admitting: Pharmacist

## 2018-07-20 ENCOUNTER — Telehealth: Payer: Self-pay | Admitting: Internal Medicine

## 2018-07-20 ENCOUNTER — Other Ambulatory Visit: Payer: Self-pay

## 2018-07-20 DIAGNOSIS — E1169 Type 2 diabetes mellitus with other specified complication: Secondary | ICD-10-CM

## 2018-07-20 DIAGNOSIS — E785 Hyperlipidemia, unspecified: Secondary | ICD-10-CM

## 2018-07-20 DIAGNOSIS — IMO0002 Reserved for concepts with insufficient information to code with codable children: Secondary | ICD-10-CM

## 2018-07-20 DIAGNOSIS — E1165 Type 2 diabetes mellitus with hyperglycemia: Principal | ICD-10-CM

## 2018-07-20 DIAGNOSIS — E1121 Type 2 diabetes mellitus with diabetic nephropathy: Secondary | ICD-10-CM

## 2018-07-20 DIAGNOSIS — N184 Chronic kidney disease, stage 4 (severe): Secondary | ICD-10-CM

## 2018-07-20 NOTE — Telephone Encounter (Signed)
Please call regarding Torsemide directions. Also to discuss Enalapril. States pt has restarted taking Enalapril.

## 2018-07-20 NOTE — Patient Outreach (Signed)
[name]) Care Management  07/20/2018  Lucas Caldwell 11-Jul-1933 [id number]    Incoming call received. Spoke to patient's wife Lucas Caldwell. HIPAA identifiers verified.  Lucas Caldwell called to inform that she had received the patient's [employer] application for [name]. Discussed application together while she filled out the information needed. Lucas Caldwell informed she would place in the mail for pickup tomorrow.  Will followup with patient in 10-14 business days if application not received back.  Turrell Severt P. Glennis Borger, [place] Management [phone]

## 2018-07-21 ENCOUNTER — Ambulatory Visit: Payer: Medicare HMO | Admitting: Family Medicine

## 2018-07-21 ENCOUNTER — Ambulatory Visit: Payer: Self-pay | Admitting: Pharmacist

## 2018-07-21 DIAGNOSIS — I5032 Chronic diastolic (congestive) heart failure: Secondary | ICD-10-CM

## 2018-07-21 NOTE — Telephone Encounter (Signed)
I spoke with Mrs. Cozart. I advised her of Dr. Darnelle Bos recommendations as stated below.  She states she misunderstood about the enalapril. She will take this out of his pill box.  She was unsure if his weight was 195 or 196 lbs today. [name] a little more SOB. He took torsemide 20 mg this morning and is due for another 20 mg of torsemide now.  I advised to take torsemide 20 mg now as prescribed and if in the morning his weight is 196 lbs or above, he will need to take torsemide 40 mg in the AM & 20 mg in the PM until his weight is below 195 lbs - Dr. Saunders Revel prefers down to 190-192 lbs.  Mrs. Sealy voices understanding of the above and is agreeable.

## 2018-07-21 NOTE — Telephone Encounter (Signed)
I attempted to call Lucas Caldwell back, Her identified voice mail states her hours are 1 pm- 5 pm M-F.  I left a detailed message of Dr. Darnelle Bos recommendations for the patient's torsemide dose as of his e-visit 07/17/18:  Chronic HFpEF: Weight is gradually drifting up, though Lucas Caldwell reports stable dyspnea without edema or orthopnea.  I advised him to continue taking torsemide 20 mg twice daily.  However, if his weight goes up any further, I advised him to increase this to 40 mg every morning and 20 mg every afternoon until his weight returns to his target weight of 190-192 pounds (195 pounds today).   I also advised that Dr. Saunders Revel had stopped the patient's enalapril on 07/10/18 due to soft BP's. I am unsure as to when the patient restarted this or why.  I asked Lucas Caldwell to call back when she is available.

## 2018-07-21 NOTE — Telephone Encounter (Signed)
I spoke with Daymon Larsen -D at [facility].  She states she spoke with the patient/ his wife yestersday. His weight was still 195 lbs- she wanted to make sure that the patient still needed torsemide 20 mg BID per Dr. Darnelle Bos 5/1 notes.   Also, the patient and his wife somehow thought he was supposed to restart enalapril at his 5/1 visit. I advised I didn't see this in Dr. Darnelle Bos note.  [name] is aware that I will forward to Dr. Saunders Revel to clarify regarding torsemide & enalapril and we will call the patient's wife back directly.

## 2018-07-21 NOTE — Patient Instructions (Signed)
Thank you allowing the Chronic Care Management Team to be a part of your care! It was a pleasure speaking with you today!     CCM (Chronic Care Management) Team    Janci Minor RN, BSN Nurse Care Coordinator  [phone]   [name] PharmD  Clinical Pharmacist  [phone]   Eula Fried LCSW Clinical Social Worker [phone]  Visit Information  Goals Addressed            This Visit's Progress   . Medication Assistance       Current Barriers:  . Financial . Limited vision  Pharmacist Clinical Goal(s): Over the next 14 days, Mr. Rahm will provide the necessary supplementary documents (proof of household income) needed for medication assistance applications to CCM pharmacist.   Interventions:   Medication Review  Confirm that patient has started taking rosuvastatin 5mg  daily as directed by Cardiologist  Mrs. Harjo confirms that patient started medication and denies patient currently complaining of any muscle pain  Route note with message to Dede Query, RN with [facility] to make sure that clinic is aware that patient was started on rosuvastatin by [facility]  Confirm that patient is taking gabapentin 300 mg: 2 tablets (600 mg) by mouth twice daily as directed.   Perform chart review of recent Cardiology notes, patient seen via telemedicine visit on 07/17/2018  Provider noted patient's weight to gradually be shifting up and instructed "if his weight goes up any further, I advised him to increase this to 40 mg every morning and 20 mg every afternoon until his weight returns to his target weight of 190-192 pounds (195 pounds today)."  Mrs. Shawgo reports that patient is weighing daily as directed and that his weight this morning (5/4) was 195 lbs and has been stable since this past visit.  Reports that patient has continued to take torsemide 20 mg twice daily as his weight has not exceeded 195 lbs.  Request confirmation from Mrs. Pritt  that patient is continuing to hold enalapril as, per Cardiology telemedicine visit from 4/24, patient was instructed to "stop taking enalapril".   Mrs. Baldi states that she had stopped giving the patient the enalapril after the visit on 4/24, but that she has since resumed giving patient enalapril as she believed that she was to restart the medication last week. Per 5/1 visit note, see no mention of enalapril being restarted and this medication remains discontinued from medication list in chart.  Call [facility] for clarification of patient's torsemide instructions (to confirm that patient is not to increase his dose of morning torsemide unless his weight exceeds 195 pounds, rather than to start to target the 190-192 weight range) and the status of his enalapril. Leave a message with Gabriel Cirri in the office.  Medication Assistance  Continue to collaborate with [facility] to assist patient with patient assistance application for Trulicity  Mrs. Hodgman reports that she has mailed this application back to [place] to collaborate with [facility]Ph[facility] to assist patient with patient assistance application for Dulera and Proventil inhalers - Mrs. Schupp reports that she has received this application in the mail and will call [place] with any questions.  Inquire about patient's ability to monitor his own blood pressure at home. Mrs. Pellegrin reports that they have a home blood pressure monitor, but the patient is unable to use it due to incorrect cuff size.  Counsel about availability of digital blood glucose monitor that can be obtained from [facility] using  patient's quarterly over the counter benefit.  Assist Mrs. Sarkis with finding monitor in benefit catalog and Mrs. Egolf confirms that patient has quarterly benefit left to use - caregiver to order  Patient Self Care Activities:  [name] Gather necessary documents needed to apply for medication assistance . Check blood sugar, blood pressure and daily  weight as directed by providers o Caregiver/wife to order blood pressure monitor from [facility] Over the Counter Benefit. . Takes medications, with assistance of wife, as directed  Please see past updates related to this goal by clicking on the "Past Updates" button in the selected goal          The patient verbalized understanding of instructions provided today and declined a print copy of patient instruction materials.   The CM team will reach out to the patient again over the next 7 days.   [name], PharmD, [place] [employer] [phone]

## 2018-07-21 NOTE — Telephone Encounter (Signed)
Lucas Caldwell should continue torsemide 20 mg BID unless his weight climbs above 195 pounds, at which time he should take 40 mg QAM and 20 mg QPM until he is below 195 again (ideally around 190-192 pounds).  He should remain off enalapril.  [name], MD [facility] Pager: [phone]

## 2018-07-21 NOTE — Chronic Care Management (AMB) (Signed)
Chronic Care Management   Follow Up Note   07/21/2018 Name: Lucas Caldwell MRN: [id number] DOB: 12-08-1933  Referred by: Lucas Hauser, DO Reason for referral : Chronic Care Management (Patient Phone Call) and Care Coordination (Cardiology)   Lucas Caldwell is a 85 y.o. year old male who is a primary care patient of Lucas Hauser, DO. The CCM team was consulted for assistance with chronic disease management and care coordination needs.    I reached out to [name], patient's wife and caregiver, today regarding medication management and medication assistance.  Review of patient status, including review of consultants reports, relevant laboratory and other test results, and collaboration with appropriate care team members and the patient's provider was performed as part of comprehensive patient evaluation and provision of chronic care management services.    Goals Addressed            This Visit's Progress   . Medication Assistance       Current Barriers:  . Financial . Limited vision  Pharmacist Clinical Goal(s): Over the next 14 days, Lucas Caldwell will provide the necessary supplementary documents (proof of household income) needed for medication assistance applications to CCM pharmacist.   Interventions:  Medication Review  Confirm that patient has started taking rosuvastatin 5mg  daily as directed by Cardiologist  Mrs. Weider confirms that patient started medication and denies patient currently complaining of any muscle pain  Route note with message to Dede Query, RN with [facility] to make sure that clinic is aware that patient was started on rosuvastatin by [facility]  Confirm that patient is taking gabapentin 300 mg: 2 tablets (600 mg) by mouth twice daily as directed.   Perform chart review of recent Cardiology notes, patient seen via telemedicine visit on 07/17/2018  Provider noted patient's weight to gradually be  shifting up and instructed "if his weight goes up any further, I advised him to increase this to 40 mg every morning and 20 mg every afternoon until his weight returns to his target weight of 190-192 pounds (195 pounds today)."  Mrs. Pandit reports that patient is weighing daily as directed and that his weight this morning (5/4) was 195 lbs and has been stable since this past visit.  Reports that patient has continued to take torsemide 20 mg twice daily as his weight has not exceeded 195 lbs.  Request confirmation from Mrs. Odem that patient is continuing to hold enalapril as, per Cardiology telemedicine visit from 4/24, patient was instructed to "stop taking enalapril".   Mrs. Bukhari states that she had stopped giving the patient the enalapril after the visit on 4/24, but that she has since resumed giving patient enalapril as she believed that she was to restart the medication last week. Per 5/1 visit note, see no mention of enalapril being restarted and this medication remains discontinued from medication list in chart.  Call [facility] for clarification of patient's torsemide instructions (to confirm that patient is not to increase his dose of morning torsemide unless his weight exceeds 195 pounds, rather than to start to target the 190-192 weight range) and the status of his enalapril. Leave a message with Gabriel Cirri in the office.  Medication Assistance  Continue to collaborate with [facility] CPh[facility] to assist patient with patient assistance application for Trulicity  Mrs. Lopata reports that she has mailed this application back to [place] to collaborate with [facility] CPh[facility] to assist patient with patient assistance application for Dulera and Proventil inhalers -  Mrs. Staver reports that she has received this application in the mail and will call [place] with any questions.  Inquire about patient's ability to monitor his own blood pressure at home. Mrs. Duerson reports that they have a home blood  pressure monitor, but the patient is unable to use it due to incorrect cuff size.  Counsel about availability of digital blood glucose monitor that can be obtained from [facility] using patient's quarterly over the counter benefit.  Assist Mrs. Stringfellow with finding monitor in benefit catalog and Mrs. Mcvay confirms that patient has quarterly benefit left to use - caregiver to order  Patient Self Care Activities:  [name] Gather necessary documents needed to apply for medication assistance . Check blood sugar, blood pressure and daily weight as directed by providers o Caregiver/wife to order blood pressure monitor from [facility] Over the Counter Benefit. . Takes medications, with assistance of wife, as directed  Please see past updates related to this goal by clicking on the "Past Updates" button in the selected goal          Plan  1) CM Pharmacist will continue to follow up with [facility] for clarification of patient's torsemide instructions and regarding the status of patient's enalapril. 2) CM Pharmacist will reach out to the patient again over the next 7 days.   [name], PharmD, [place] [employer] [phone]

## 2018-07-22 NOTE — Patient Instructions (Signed)
Thank you allowing the Chronic Care Management Team to be a part of your care! It was a pleasure speaking with you today!     CCM (Chronic Care Management) Team    Janci Minor RN, BSN Nurse Care Coordinator  [phone]   [name] PharmD  Clinical Pharmacist  [phone]   Eula Fried LCSW Clinical Social Worker [phone]  Visit Information  Goals Addressed            This Visit's Progress   . Medication Assistance       Current Barriers:  . Financial . Limited vision  Pharmacist Clinical Goal(s): Over the next 14 days, Lucas Caldwell will provide the necessary supplementary documents (proof of household income) needed for medication assistance applications to CCM pharmacist.   Interventions:  Collaborate with Alvis Lemmings, NP with [facility] for clarification of patient's torsemide instructions and regarding the status of patient's enalapril  Perform chart review of 5/4 Cardiology telephone note following this conversation  Per Dr. Saunders Revel "[name] should continue torsemide 20 mg BID unless his weight climbs above 195 pounds, at which time he should take 40 mg QAM and 20 mg QPM until he is below 195 again (ideally around 190-192 pounds).  He should remain off enalapril."  [facility] RN follows up directly with Lucas Caldwell Caregiver to remove enalapril from pillbox and verbalizes understanding of torsemide instruction.  Medication Assistance  Continue to collaborate with [facility] CPhT to assist patient with patient assistance application for Trulicity  Lucas Caldwell reports that she has mailed this application back to [place] to collaborate with [facility] CPhT to assist patient with patient assistance application for Dulera and Proventil inhalers - Lucas Caldwell reports that she has received this application in the mail and will call [place] with any questions.  Inquire about patient's ability to monitor his own blood pressure at home. Mrs.  Caldwell reports that they have a home blood pressure monitor, but the patient is unable to use it due to incorrect cuff size.  Counsel about availability of digital blood glucose monitor that can be obtained from [facility] using patient's quarterly over the counter benefit.  Assist Lucas Caldwell with finding monitor in benefit catalog and Lucas Caldwell confirms that patient has quarterly benefit left to use - caregiver to order  Patient Self Care Activities:  [name] Gather necessary documents needed to apply for medication assistance . Check blood sugar, blood pressure and daily weight as directed by providers o Caregiver/wife to order blood pressure monitor from [facility] Over the Counter Benefit. . Takes medications, with assistance of wife, as directed  Please see past updates related to this goal by clicking on the "Past Updates" button in the selected goal          The patient verbalized understanding of instructions provided today and declined a print copy of patient instruction materials.   The CM team will reach out to the patient again over the next 7 days.   [name], PharmD, [place] [employer] [phone]

## 2018-07-22 NOTE — Chronic Care Management (AMB) (Signed)
  Chronic Care Management   Follow Up Note   07/22/2018 Name: Lucas Caldwell MRN: [id number] DOB: [birth year]  Referred by: [name], DO Reason for referral : Care Coordination (Cardiologist)   Lucas Caldwell is a 85 y.o. year old male who is a primary care patient of [name], DO. The CCM team was consulted for assistance with chronic disease management and care coordination needs.    Collaboration of care phone call to [facility] for clarification of patient's torsemide instructions and regarding the status of patient's enalapril  Review of patient status, including review of consultants reports, relevant laboratory and other test results, and collaboration with appropriate care team members and the patient's provider was performed as part of comprehensive patient evaluation and provision of chronic care management services.    Goals Addressed            This Visit's Progress   . Medication Assistance       Current Barriers:  . Financial . Limited vision  Pharmacist Clinical Goal(s): Over the next 14 days, Lucas Caldwell will provide the necessary supplementary documents (proof of household income) needed for medication assistance applications to CCM pharmacist.   Interventions:  Collaborate with Alvis Lemmings, NP with [facility] for clarification of patient's torsemide instructions and regarding the status of patient's enalapril  Perform chart review of 5/4 Cardiology telephone note following this conversation  Per Dr. Saunders Revel "Lucas Caldwell should continue torsemide 20 mg BID unless his weight climbs above 195 pounds, at which time he should take 40 mg QAM and 20 mg QPM until he is below 195 again (ideally around 190-192 pounds).  He should remain off enalapril."  [facility] RN follows up directly with Mrs. Monks Caregiver to remove enalapril from pillbox and verbalizes understanding of torsemide instruction.  Medication  Assistance  Continue to collaborate with [facility] CPhT to assist patient with patient assistance application for Trulicity  Mrs. Sargeant reports that she has mailed this application back to [place] to collaborate with [facility] CPhT to assist patient with patient assistance application for Dulera and Proventil inhalers - Mrs. Saxer reports that she has received this application in the mail and will call [employer] with any questions.  Inquire about patient's ability to monitor his own blood pressure at home. Mrs. Imbert reports that they have a home blood pressure monitor, but the patient is unable to use it due to incorrect cuff size.  Counsel about availability of digital blood glucose monitor that can be obtained from [facility] using patient's quarterly over the counter benefit.  Assist Mrs. Baldinger with finding monitor in benefit catalog and Mrs. Feigel confirms that patient has quarterly benefit left to use - caregiver to order  Patient Self Care Activities:  [name] Gather necessary documents needed to apply for medication assistance . Check blood sugar, blood pressure and daily weight as directed by providers o Caregiver/wife to order blood pressure monitor from [facility] Over the Counter Benefit. . Takes medications, with assistance of wife, as directed  Please see past updates related to this goal by clicking on the "Past Updates" button in the selected goal          Plan  The CM team will reach out to the patient again over the next 7 days.   [name], PharmD, [place] [employer] [phone]

## 2018-07-23 ENCOUNTER — Telehealth: Payer: Self-pay | Admitting: Family Medicine

## 2018-07-23 DIAGNOSIS — M25561 Pain in right knee: Secondary | ICD-10-CM

## 2018-07-23 DIAGNOSIS — M170 Bilateral primary osteoarthritis of knee: Secondary | ICD-10-CM

## 2018-07-23 NOTE — Telephone Encounter (Signed)
Last seen on 06/09/18 for office visit for Right knee pain and steroid injection.  Now called today 6 weeks later requesting repeat injection in Right knee.  He was advised it is too soon, recommended to wait total 3 months prior to next steroid injection in that joint.  We had advised may need Orthopedic consultation previously if not improving to injection.  Will go ahead and place referral today.  Orders Placed This Encounter  Procedures  . Ambulatory referral to Orthopedic Surgery    Referral Priority:   Routine    Referral Type:   Surgical    Referral Reason:   Specialty Services Required    Requested Specialty:   Orthopedic Surgery    Number of Visits Requested:   [place], [facility] 07/23/2018, 8:41 AM

## 2018-07-24 ENCOUNTER — Telehealth: Payer: Self-pay

## 2018-07-24 NOTE — Telephone Encounter (Signed)
-----   Message from Vella Raring, [facility] sent at 07/20/2018  3:44 PM EDT ----- Estill Bamberg,  As you manage Mr. Rafalski' warfarin, I wanted to make sure that you were aware that Mr. Sharolyn Douglas started patient on rosuvastatin 5 mg daily. Note that rosuvastatin, unlike atorvastatin (which patient was on previously) can have a potential affect on patient's INR level and I did not know if you would want to see the patient back sooner.    Thank you!  [name], PharmD, [place] [employer] [phone]

## 2018-07-24 NOTE — Telephone Encounter (Signed)
Attempted to call pt to see if we can move his next INR check up to next Mon or Wed, if possible. No answer, no machine, unable to leave message. Will attempt again later.

## 2018-07-27 NOTE — Telephone Encounter (Signed)
Spoke w/ pt's wife.  She has appt at the cancer center on Wed, 5/13 @ 10:00, will bring pt around to drive thru @ [place] for INR check @ 9:45.  1. Do you currently have a fever? No 2. Have you recently travelled on a cruise, internationally, or to [place], [place], [place], [place], [place], or [place], [place] [employer]) ? No 3. Have you been in contact with someone that is currently pending confirmation of Covid19 testing or has been confirmed to have the [place] virus?  No 4. Are you currently experiencing fatigue or cough? No  Spoke w/ pt's wife. Advised that we are restricting visitors at this time and anyone present in the vehicle should meet the above criteria as well. Advised that visit will be at curbside for finger stick ONLY and will receive call with instructions. Pt also advised to please bring own pen for signature of arrival document.

## 2018-07-28 ENCOUNTER — Other Ambulatory Visit: Payer: Self-pay

## 2018-07-28 ENCOUNTER — Ambulatory Visit (INDEPENDENT_AMBULATORY_CARE_PROVIDER_SITE_OTHER): Payer: Medicare HMO | Admitting: Pharmacist

## 2018-07-28 DIAGNOSIS — I10 Essential (primary) hypertension: Secondary | ICD-10-CM | POA: Diagnosis not present

## 2018-07-28 DIAGNOSIS — E1165 Type 2 diabetes mellitus with hyperglycemia: Secondary | ICD-10-CM | POA: Diagnosis not present

## 2018-07-28 DIAGNOSIS — I5032 Chronic diastolic (congestive) heart failure: Secondary | ICD-10-CM

## 2018-07-28 DIAGNOSIS — E1121 Type 2 diabetes mellitus with diabetic nephropathy: Secondary | ICD-10-CM

## 2018-07-28 DIAGNOSIS — IMO0002 Reserved for concepts with insufficient information to code with codable children: Secondary | ICD-10-CM

## 2018-07-28 NOTE — Chronic Care Management (AMB) (Signed)
  Chronic Care Management   Follow Up Note   07/28/2018 Name: Lucas Caldwell MRN: [id number] DOB: 1934-03-02  Referred by: [name], DO Reason for referral : Chronic Care Management (Patient/caregiver Phone Call)   Lucas Caldwell is a 85 y.o. year old male who is a primary care patient of [name], DO. The CCM team was consulted for assistance with chronic disease management and care coordination needs.    I reached out to [name], patient's wife and caregiver, today to follow up regarding medication management and medication assistance.  Review of patient status, including review of consultants reports, relevant laboratory and other test results, and collaboration with appropriate care team members and the patient's provider was performed as part of comprehensive patient evaluation and provision of chronic care management services.    Goals Addressed            This Visit's Progress   . Medication Assistance       Current Barriers:  . Financial . Limited vision  Pharmacist Clinical Goal(s):  [name] Over the next 30 days, patient will work with CM Pharmacist to address needs related to medication assistance  Interventions:  Discuss with caregiver/wife the importance of medication and monitoring adherence  [name] confirms that patient has been weighing himself daily as directed by Cardiologist and states that patient's weight this morning was 196 lbs  Confirms that accordingly, as directed by Dr. Saunders Revel, patient took torsemide 40 mg this morning and will take 20 mg this evening and that he will continue to take his torsemide this way until he is below 195 again. Will return to taking torsemide 20 mg BID once his weight has returned to under this threshold.  Confirms that patient has remained off of enalapril as directed by Cardiologist.  Medication Assistance  Continue to collaborate with [facility] CPhT to assist patient with patient  assistance applications for Trulicity, Dulera and Proventil  [name] reports that she has mailed these applications back to [name] that patient has 3 doses of Trulicity remaining.  Inquire about blood pressure monitor to be ordered via [facility] quarterly over the counter benefit.  [name] confirms that she ordered the device, but that it has not yet arrived.  Provide [name] with phone number for [facility] as listed in chart.   [name] reports that patient is scheduled to have labs from patient's Oncologist drawn tomorrow, but needs to confirms the timing.  Patient Self Care Activities:  [name] Gather necessary documents needed to apply for medication assistance . Check blood sugar, blood pressure and daily weight as directed by providers o Caregiver/wife to order blood pressure monitor from [facility] Over the Counter Benefit. . Takes medications, with assistance of wife, as directed . Patient attends scheduled medical appointments  Initial goal documentation        Plan  1) Patient has INR check appointment scheduled with [facility] for tomorrow at 9:45 am 2) CM Pharmacist will reach out to the patient again over the next 14 days to follow up regarding medication assistance and diabetes management.  [name], PharmD, [place] [employer] [phone]

## 2018-07-28 NOTE — Patient Instructions (Signed)
Thank you allowing the Chronic Care Management Team to be a part of your care! It was a pleasure speaking with you today!     CCM (Chronic Care Management) Team    Lucas Minor RN, BSN Nurse Care Coordinator  [phone]   Lucas Caldwell PharmD  Clinical Pharmacist  [phone]   Lucas Fried LCSW Clinical Social Worker [phone]  Visit Information  Goals Addressed            This Visit's Progress   . Medication Assistance       Current Barriers:  . Financial . Limited vision  Pharmacist Clinical Goal(s):  [name] Over the next 30 days, patient will work with CM Pharmacist to address needs related to medication assistance  Interventions:  Discuss with caregiver/wife the importance of medication and monitoring adherence  [name] confirms that patient has been weighing himself daily as directed by Cardiologist and states that patient's weight this morning was 196 lbs  Confirms that accordingly, as directed by Dr. Saunders Caldwell, patient took torsemide 40 mg this morning and will take 20 mg this evening and that he will continue to take his torsemide this way until he is below 195 again. Will return to taking torsemide 20 mg BID once his weight has returned to under this threshold.  Confirms that patient has remained off of enalapril as directed by Cardiologist.  Medication Assistance  Continue to collaborate with [facility] CPhT to assist patient with patient assistance applications for Trulicity, Dulera and Proventil  [name] reports that she has mailed these applications back to [place] about blood pressure monitor to be ordered via [facility] quarterly over the counter benefit.  [name] confirms that she ordered the device, but that it has not yet arrived.  Provide [name] with phone number for [facility] as listed in chart.   [name] reports that patient is scheduled to have labs from patient's Oncologist drawn tomorrow, but needs to  confirms the timing.  Patient Self Care Activities:  [name] Gather necessary documents needed to apply for medication assistance . Check blood sugar, blood pressure and daily weight as directed by providers o Caregiver/wife to order blood pressure monitor from [facility] Over the Counter Benefit. . Takes medications, with assistance of wife, as directed . Patient attends scheduled medical appointments  Initial goal documentation        The patient verbalized understanding of instructions provided today and declined a print copy of patient instruction materials.   The CM team will reach out to the patient again over the next 14 days.   Lucas Caldwell, PharmD, [place] [employer] [phone]

## 2018-07-29 ENCOUNTER — Ambulatory Visit: Admit: 2018-07-29 | Discharge: 2018-07-30 | Payer: MEDICARE

## 2018-07-29 DIAGNOSIS — C9210 Chronic myeloid leukemia, BCR/ABL-positive, not having achieved remission: Principal | ICD-10-CM

## 2018-07-29 NOTE — Telephone Encounter (Signed)
Pt's wife came to drive thru this am without pt, stating that she had an appt at another office and did not bring him. She asks how long I will be outside, I told her until 12:00, she stated that she could not have pt back here by that time.  Advised her that we will need to r/s pt to another date. Attempted to call pt to r/s, but there is no answer and no machine.

## 2018-07-30 ENCOUNTER — Other Ambulatory Visit: Payer: Self-pay | Admitting: Pharmacy Technician

## 2018-07-30 NOTE — Patient Outreach (Signed)
[facility]) Care Management  07/30/2018  Lucas Caldwell Aug 08, 1933 [id number]    Received all necessary documents, paperwork and signatures from both patient and provider for [employer] patient assistance for Trulcity and [employer] patient assistance for [name].  Submitted completed Financial risk analyst.  Submitted completed Lilly application via fax.  Will followup with [employer] in 10-14 business days to see if they have received the application and subsequently mailed out the attestation letter to the patient to fill out and submit back in with all documents that they sent to the patient including the application.  Will followup with [employer] in 5-10 business days to inquire on status of application.  Delia Sitar P. Nemesio Castrillon, [place] Management [phone]

## 2018-07-31 ENCOUNTER — Telehealth: Payer: Self-pay

## 2018-07-31 NOTE — Telephone Encounter (Signed)
1. Do you currently have a fever? No 2. Have you recently travelled on a cruise, internationally, or to [place], [place], [place], [place], [place], or [place], [place] [employer]) ? No 3. Have you been in contact with someone that is currently pending confirmation of Covid19 testing or has been confirmed to have the Joy virus?  No 4. Are you currently experiencing fatigue or cough? No  Spoke w/ pt's wife. Advised that we are restricting visitors at this time and anyone present in the vehicle should meet the above criteria as well. Advised that visit will be at curbside for finger stick ONLY and will receive call with instructions. Pt also advised to please bring own pen for signature of arrival document.

## 2018-08-03 ENCOUNTER — Other Ambulatory Visit: Payer: Self-pay

## 2018-08-03 ENCOUNTER — Ambulatory Visit (INDEPENDENT_AMBULATORY_CARE_PROVIDER_SITE_OTHER): Payer: Medicare HMO

## 2018-08-03 DIAGNOSIS — I480 Paroxysmal atrial fibrillation: Secondary | ICD-10-CM

## 2018-08-03 DIAGNOSIS — Z7901 Long term (current) use of anticoagulants: Secondary | ICD-10-CM

## 2018-08-03 LAB — POCT INR: INR: 2.4 (ref 2.0–3.0)

## 2018-08-03 NOTE — Patient Instructions (Signed)
Please continue dosage of 1 TABLET EVERY DAY EXCEPT 1.5 TABLETS ON MONDAYS & FRIDAYS. Recheck INR in 6 weeks.

## 2018-08-05 ENCOUNTER — Institutional Professional Consult (permissible substitution): Admit: 2018-08-05 | Discharge: 2018-08-06 | Payer: MEDICARE

## 2018-08-05 DIAGNOSIS — C9310 Chronic myelomonocytic leukemia not having achieved remission: Secondary | ICD-10-CM | POA: Diagnosis not present

## 2018-08-11 ENCOUNTER — Other Ambulatory Visit: Payer: Self-pay | Admitting: Pharmacy Technician

## 2018-08-11 ENCOUNTER — Ambulatory Visit: Payer: Self-pay | Admitting: Pharmacist

## 2018-08-11 ENCOUNTER — Other Ambulatory Visit: Payer: Self-pay | Admitting: Gastroenterology

## 2018-08-11 DIAGNOSIS — K219 Gastro-esophageal reflux disease without esophagitis: Secondary | ICD-10-CM

## 2018-08-11 DIAGNOSIS — E1121 Type 2 diabetes mellitus with diabetic nephropathy: Secondary | ICD-10-CM

## 2018-08-11 DIAGNOSIS — IMO0002 Reserved for concepts with insufficient information to code with codable children: Secondary | ICD-10-CM

## 2018-08-11 NOTE — Patient Outreach (Signed)
[facility]) Care Management  08/11/2018  VIRGEL HARO February 05, 1934 [id number]    Incoming call received from patient's wife in regards to medication assistance.  Spoke to patient's wife, HIPAA identifiers verified.  [name] was calling to inquire if Levemir was one of the medications that we were applying for assistance. Informed patient's wife that it was not but that I could have [place] outreach patient to see what options were available. Patient was agreeable.  Will inbasket [facility] RPh [name] to followup with patient concerning the cost of Levemir and options for patient assistance.  Molly Maselli P. Whisper Kurka, [place] Management [phone]

## 2018-08-11 NOTE — Patient Instructions (Signed)
Thank you allowing the Chronic Care Management Team to be a part of your care! It was a pleasure speaking with you today!     CCM (Chronic Care Management) Team    Janci Minor RN, BSN Nurse Care Coordinator  [phone]   [name] PharmD  Clinical Pharmacist  [phone]   Eula Fried LCSW Clinical Social Worker [phone]  Visit Information  Goals Addressed            This Visit's Progress   . Medication Assistance       Current Barriers:  . Financial . Limited vision  Pharmacist Clinical Goal(s):  [name] Over the next 30 days, Lucas Caldwell will work with CM Pharmacist to address needs related to medication assistance  Interventions:  Medication Assistance  Lucas Caldwell reports that Lucas Caldwell is currently out of his Levemir insulin and unable to afford the copayment for this prescription. Reports that Lucas Caldwell has entered the coverage gap of his insurance.  Counsel wife/caregiver regarding [employer]. Provide wife with phone number for requesting this immediate supply  Follow back up with Lucas Caldwell after phone call to confirm that caregiver was able to obtain assistance for pick up of prescription today ? Continue to collaborate with [facility] CPhT to assist Lucas Caldwell with Lucas Caldwell assistance applications for Trulicity, [name] and Proventil  Lucas Caldwell Self Care Activities:  [name] Gather necessary documents needed to apply for medication assistance . Check blood sugar, blood pressure and daily weight as directed by providers o Caregiver/wife to order blood pressure monitor from [facility] Over the Counter Benefit. . Takes medications, with assistance of wife, as directed . Lucas Caldwell attends scheduled medical appointments  Please see past updates related to this goal by clicking on the "Past Updates" button in the selected goal         The Lucas Caldwell verbalized understanding of instructions provided today and declined a print copy of Lucas Caldwell  instruction materials.   The CM team will reach out to the Lucas Caldwell again tomorrow  [name], PharmD, [place] [phone]

## 2018-08-11 NOTE — Chronic Care Management (AMB) (Signed)
  Chronic Care Management   Follow Up Note   08/11/2018 Name: Lucas Caldwell MRN: [id number] DOB: [birth year]  Referred by: [name], DO Reason for referral : Chronic Care Management (Caregiver Phone Call)   Lucas Caldwell is a 85 y.o. year old male who is a primary care patient of [name], DO. The CCM team was consulted for assistance with chronic disease management and care coordination needs.    Incoming call from [name], Engineer, production, who reports that patient is currently out of his Levemir insulin and unable to afford the copayment for this prescription. Reports that patient has entered the coverage gap of his insurance.  Review of patient status, including review of consultants reports, relevant laboratory and other test results, and collaboration with appropriate care team members and the patient's provider was performed as part of comprehensive patient evaluation and provision of chronic care management services.    Goals Addressed            This Visit's Progress   . Medication Assistance       Current Barriers:  . Financial . Limited vision  Pharmacist Clinical Goal(s):  [name] Over the next 30 days, patient will work with CM Pharmacist to address needs related to medication assistance  Interventions:  Medication Assistance  Mrs. Fortner reports that patient is currently out of his Levemir insulin and unable to afford the copayment for this prescription. Reports that patient has entered the coverage gap of his insurance.  Counsel wife/caregiver regarding [employer]. Provide wife with phone number for requesting this immediate supply  Follow back up with patient after phone call to confirm that caregiver was able to obtain assistance for pick up of prescription today ? Continue to collaborate with [facility] CPhT to assist patient with patient assistance applications for Trulicity, [name]  and Proventil  Patient Self Care Activities:  [name] Gather necessary documents needed to apply for medication assistance . Check blood sugar, blood pressure and daily weight as directed by providers o Caregiver/wife to order blood pressure monitor from [facility] Over the Counter Benefit. . Takes medications, with assistance of wife, as directed . Patient attends scheduled medical appointments  Please see past updates related to this goal by clicking on the "Past Updates" button in the selected goal         Plan  CM Pharmacist will reach out to the patient again tomorrow to follow up regarding medication assistance.  [name], PharmD, [place] [employer] [phone]

## 2018-08-12 ENCOUNTER — Other Ambulatory Visit: Payer: Self-pay | Admitting: Pharmacy Technician

## 2018-08-12 ENCOUNTER — Other Ambulatory Visit: Payer: Self-pay | Admitting: Internal Medicine

## 2018-08-12 ENCOUNTER — Ambulatory Visit: Payer: Medicare HMO | Admitting: Pharmacist

## 2018-08-12 ENCOUNTER — Other Ambulatory Visit: Payer: Self-pay

## 2018-08-12 DIAGNOSIS — I10 Essential (primary) hypertension: Secondary | ICD-10-CM

## 2018-08-12 DIAGNOSIS — E1121 Type 2 diabetes mellitus with diabetic nephropathy: Secondary | ICD-10-CM | POA: Diagnosis not present

## 2018-08-12 DIAGNOSIS — I5032 Chronic diastolic (congestive) heart failure: Secondary | ICD-10-CM | POA: Diagnosis not present

## 2018-08-12 DIAGNOSIS — IMO0002 Reserved for concepts with insufficient information to code with codable children: Secondary | ICD-10-CM

## 2018-08-12 DIAGNOSIS — E1165 Type 2 diabetes mellitus with hyperglycemia: Secondary | ICD-10-CM | POA: Diagnosis not present

## 2018-08-12 MED ORDER — TORSEMIDE 20 MG PO TABS: 20.0000 mg | ORAL_TABLET | Freq: Two times a day (BID) | ORAL | 1 refills | Status: DC

## 2018-08-12 NOTE — Telephone Encounter (Signed)
Pt is taking 40 mg am and 20 mg tablet pm. Please advise correct dose to send received original request for Furosemide 20 mg tablet bid request sent in but just received Furosemide 40 mg tablet request.

## 2018-08-12 NOTE — Patient Outreach (Signed)
[facility]) Care Management  08/12/2018  Lucas Caldwell 14-Oct-1933 [id number]   Care coordination call placed to [facility] in regards to patient's application for Trulicity.  Spoke to [place] who informed patient had been APPROVED 08/05/2018-03/18/2019. Corene Cornea informed order was placed with [facility] on 08/06/2018. Corene Cornea called over to [employer] and was informed that a script was needed from the provider. He informed the script could be faxed into [facility] at [phone], calling [facility] at [phone] or [place] at [phone].  Inquired the process for the provider if e wanted to add a medication for the [facility] application. Corene Cornea informed since patient was already approved then the provider could call or fax over to [facility] page 5 of the application, the refill request sheet which is available on Lilly's website or print off and send in a traditional prescription. He informed it has to go to [place] first and then they will forward to [employer].  Will outreach [facility] RPh [name] of this information. Will followup with [employer] in 3-5 business days to confirm.  [name] P. Omer Puccinelli, [place] Management [phone]

## 2018-08-12 NOTE — Patient Instructions (Signed)
Thank you allowing the Chronic Care Management Team to be a part of your care! It was a pleasure speaking with you today!     CCM (Chronic Care Management) Team    Janci Minor RN, BSN Nurse Care Coordinator  [phone]   [name] PharmD  Clinical Pharmacist  [phone]   Eula Fried LCSW Clinical Social Worker [phone]  Visit Information  Goals Addressed            This Visit's Progress   . Medication Assistance       Current Barriers:  . Financial . Limited vision  Pharmacist Clinical Goal(s):  [name] Over the next 30 days, patient will work with CM Pharmacist to address needs related to medication assistance  Interventions:  Blood pressure management  Mrs. Hettich confirms that patient was able to receive new blood pressure monitor through [facility] Over the counter benefit and that he has been checking his blood pressure  Medication Assistance ? Mrs. Ahr confirms that she was able to pick up a 1 month supply of patient's Levemir from his pharmacy yesterday, through the [employer] Immediate Supply program.  ? Mrs. Stamper states that patient is out of Trulicity injection (next dose due on 5/30) ? Continue to collaborate with [facility] CPhT to assist patient with patient assistance applications for Trulicity, Dulera and Proventil ? Receive message from [place] today letting me know that patient has been approved to receive Trulicity through the [employer] patient assistance program, but that further follow up is needed to [facility] (dispensing pharmacy) ? Call to follow up with [facility]. Speak with Derinda Sis to confirm details of prescription as written by PCP. Mikki Santee states that he will attempt to expedite mailing of patient's Trulicity to office to have it arrive by Friday, 5/29.  Collaborate with PCP to request that he consider switching patient's long-acting insulin from Levemir to [place] insulin for patient to receive his insulin through  patient assistance.  Patient Self Care Activities:  [name] Gather necessary documents needed to apply for medication assistance . Check blood sugar, blood pressure and daily weight as directed by providers Date Fasting Blood Glucose  23 - May -  24 - May 195  25 - May 156  26 - May 154  27 - May 164     *unable to locate blood sugar results prior to 5/24 . Takes medications, with assistance of wife, as directed . Patient attends scheduled medical appointments  Please see past updates related to this goal by clicking on the "Past Updates" button in the selected goal         The patient verbalized understanding of instructions provided today and declined a print copy of patient instruction materials.   Telephone follow up appointment with CCM team member scheduled for: tomorrow at 3 pm  [name], PharmD, [place] [phone]

## 2018-08-12 NOTE — Chronic Care Management (AMB) (Signed)
Chronic Care Management   Follow Up Note   08/12/2018 Name: Lucas Caldwell MRN: [id number] DOB: [birth year]  Referred by: [name], DO Reason for referral : Chronic Care Management (Patient Phone Call)   Lucas Caldwell is a 85 y.o. year old male who is a primary care patient of [name], DO. The CCM team was consulted for assistance with chronic disease management and care coordination needs.  Lucas Caldwell has a past medical history including but not limited to type 2 diabetes, CKD, hyperlipidemia, COPD, atrial fibrillation, hypertension, coronary artery disease, Chronic HFpEF, hypothyroidism, and CML  Outreach call to Lucas Caldwell today regarding medication management and medication assistance. Speak with Lucas Caldwell, patient's wife and caregiver. Lucas Caldwell reports that Lucas Caldwell is not currently home.   Also outreach to [employer], dispensing pharmacy for [employer] patient assistance program   Review of patient status, including review of consultants reports, relevant laboratory and other test results, and collaboration with appropriate care team members and the patient's provider was performed as part of comprehensive patient evaluation and provision of chronic care management services.    Goals Addressed            This Visit's Progress   . Medication Assistance       Current Barriers:  . Financial . Limited vision  Pharmacist Clinical Goal(s):  [name] Over the next 30 days, patient will work with CM Pharmacist to address needs related to medication assistance  Interventions:  Blood pressure management  Lucas Caldwell confirms that patient was able to receive new blood pressure monitor through [facility] Over the counter benefit and that he has been checking his blood pressure  Medication Assistance ? Lucas Caldwell confirms that she was able to pick up a 1 month supply of patient's Levemir from his pharmacy yesterday, through the [employer] Immediate Supply program.  ? Mrs. Sentell states that patient is out of Trulicity injection (next dose due on 5/30) ? Continue to collaborate with [facility] CPhT to assist patient with patient assistance applications for Trulicity, Dulera and Proventil ? Receive message from [place] today letting me know that patient has been approved to receive Trulicity through the [employer] patient assistance program, but that further follow up is needed to [facility] (dispensing pharmacy) ? Call to follow up with [facility]. Speak with Derinda Sis to confirm details of prescription as written by PCP. Lucas Caldwell states that he will attempt to expedite mailing of patient's Trulicity to office to have it arrive by Friday, 5/29.  Collaborate with PCP to request that he consider switching patient's long-acting insulin from Levemir to [place] insulin for patient to receive his insulin through patient assistance.  Patient Self Care Activities:  [name] Gather necessary documents needed to apply for medication assistance . Check blood sugar, blood pressure and daily weight as directed by providers Date Fasting Blood Glucose  23 - May -  24 - May 195  25 - May 156  26 - May 154  27 - May 164     *unable to locate blood sugar results prior to 5/24 . Takes medications, with assistance of wife, as directed . Patient attends scheduled medical appointments  Please see past updates related to this goal by clicking on the "Past Updates" button in the selected goal         Plan  Telephone follow up appointment with CM Pharmacist scheduled for tomorrow, 5/28 at 3 pm to follow up directly  with patient regarding blood pressure and blood sugar readings  Lucas Caldwell, PharmD, [place] [employer] [phone]

## 2018-08-12 NOTE — Telephone Encounter (Signed)
*  STAT* If patient is at the pharmacy, call can be transferred to refill team.   1. Which medications need to be refilled? (please list name of each medication and dose if known) Torsemide  2. Which pharmacy/location (including street and city if local pharmacy) is medication to be sent to? [facility]  3. Do they need a 30 day or 90 day supply? [place]

## 2018-08-13 ENCOUNTER — Ambulatory Visit: Payer: Medicare HMO | Admitting: Pharmacist

## 2018-08-13 DIAGNOSIS — I10 Essential (primary) hypertension: Secondary | ICD-10-CM | POA: Diagnosis not present

## 2018-08-13 DIAGNOSIS — I5032 Chronic diastolic (congestive) heart failure: Secondary | ICD-10-CM | POA: Diagnosis not present

## 2018-08-13 DIAGNOSIS — IMO0002 Reserved for concepts with insufficient information to code with codable children: Secondary | ICD-10-CM

## 2018-08-13 DIAGNOSIS — E1121 Type 2 diabetes mellitus with diabetic nephropathy: Secondary | ICD-10-CM

## 2018-08-13 DIAGNOSIS — E1165 Type 2 diabetes mellitus with hyperglycemia: Secondary | ICD-10-CM | POA: Diagnosis not present

## 2018-08-13 MED ORDER — BOSUTINIB 100 MG TABLET
ORAL_TABLET | Freq: Every day | ORAL | 5 refills | 0.00000 days | Status: CP
Start: 2018-08-13 — End: ?

## 2018-08-14 ENCOUNTER — Ambulatory Visit: Payer: Self-pay | Admitting: Pharmacist

## 2018-08-14 ENCOUNTER — Other Ambulatory Visit: Payer: Self-pay | Admitting: Pharmacy Technician

## 2018-08-14 DIAGNOSIS — IMO0002 Reserved for concepts with insufficient information to code with codable children: Secondary | ICD-10-CM

## 2018-08-14 DIAGNOSIS — E1121 Type 2 diabetes mellitus with diabetic nephropathy: Secondary | ICD-10-CM

## 2018-08-14 NOTE — Chronic Care Management (AMB) (Signed)
  Chronic Care Management   Follow Up Note   08/14/2018 Name: Lucas Caldwell MRN: [id number] DOB: [birth year]  Referred by: [name], DO Reason for referral : Chronic Care Management (Patient Phone Call) and Care Coordination   Lucas Caldwell is a 85 y.o. year old male who is a primary care patient of [name], DO. The CCM team was consulted for assistance with chronic disease management and care coordination needs.  [name] has a past medical history including but not limited to type 2 diabetes, CKD, hyperlipidemia, COPD, atrial fibrillation, hypertension, coronary artery disease, Chronic HFpEF, hypothyroidism, and CML  Receive call from [place] letting me know that she spoke with [facility] (dispensing pharmacy for [employer]) this morning and patient's Trulicity will not be shipped out until Monday, 08/17/18.   Receive a voicemail from Mrs. Schuff requesting a call back.  Outreach call to [name]/Mrs. Ehrich today regarding medication assistance. Speak with [name], patient's wife and caregiver.   Review of patient status, including review of consultants reports, relevant laboratory and other test results, and collaboration with appropriate care team members and the patient's provider was performed as part of comprehensive patient evaluation and provision of chronic care management services.    Goals Addressed            This Visit's Progress   . Medication Assistance       Current Barriers:  . Financial . Limited vision  Pharmacist Clinical Goal(s):  [name] Over the next 30 days, patient will work with CM Pharmacist to address needs related to medication assistance  Interventions:  Medication Assistance ? Advise Mrs. Waddington that I spoke [facility] CPhT and patient's Trulicity will not be shipped out until Monday, 08/17/18. ? Advise Mrs. Gubler that per PCP she can pick up a box of sample pens (2 pens/box) for patient  today. Advise her of today's office hours and to call the office when she arrives.  ? Continue to collaborate with [facility] CPhT to assist patient with patient assistance applications for [facility] and Proventil ? Advise Mrs. Popson that [employer] has sent patient the attestation letter on 5/26. Ask Mrs. Swisher to watch for this letter from [employer] in the mail and then call [place] to assist her with completing it.  Collaborated with PCP regarding switching patient's long-acting insulin from Levemir to [name] for patient to receive his insulin through patient assistance.  PCP agrees to switch for patient to have medication assistance.  PCP to send prescription for [name] to [employer] via Rx Refill form from [employer]  CM Pharmacist to follow up with [facility] (dispensiing pharmacy) next week  Patient Self Care Activities:  [name] Gather necessary documents needed to apply for medication assistance . Check blood sugar, blood pressure and daily weight as directed by providers    . Takes medications, with assistance of wife, as directed . Patient attends scheduled medical appointments  Please see past updates related to this goal by clicking on the "Past Updates" button in the selected goal          Plan  1) CM Pharmacist will follow up with [facility] over the next 7 days. 2) The care management team will reach out to the patient again over the next 14 days.   [name], PharmD, [place] [employer] [phone]

## 2018-08-14 NOTE — Patient Outreach (Signed)
[facility]) Care Management  08/14/2018  KHALIQ TURAY 1933/07/19 [id number]  Care coordination call placed to [place] in regards to patient's application for Trulicity.  Spoke to [place] who said the provider or patient needed to call in to set up delivery. Informed Natalia the provider called in on the 27th and was told the medication would be delivered today the 29th. Natalia transferred me to [employer].  Spoke to [place] at [employer] who informed that Mikki Santee whom the pharmacist spoke to on the 27th, had "set up" the medication for delivery but had not "scheduled it". She informed she would schedule it to be shipped on June 1 with a delivery date of June 2 to the provider's office. She informed this was the best they could do as they do not deliver on the weekends.  Will outreach Ryan of this development and will outreach patient in 3-5 business days to conifrm receipt of medication.  Estelene Carmack P. Rowen Hur, [place] Management [phone]

## 2018-08-14 NOTE — Chronic Care Management (AMB) (Signed)
Chronic Care Management   Follow Up Note   08/14/2018 Name: Lucas Caldwell MRN: [id number] DOB: 1933/07/19  Referred by: [name], DO Reason for referral : Chronic Care Management (Patient Phone Call)   Lucas Caldwell is a 85 y.o. year old male who is a primary care patient of [name], DO. The CCM team was consulted for assistance with chronic disease management and care coordination needs.  Lucas Caldwell has a past medical history including but not limited to type 2 diabetes, CKD, hyperlipidemia, COPD, atrial fibrillation, hypertension, coronary artery disease, Chronic HFpEF, hypothyroidism, and CML  Outreach call to Lucas Caldwell/Lucas Caldwell today regarding medication management and medication assistance. Speak with Lucas Caldwell, patient's wife and caregiver. Lucas Caldwell reports that Lucas Caldwell is not currently home.  Review of patient status, including review of consultants reports, relevant laboratory and other test results, and collaboration with appropriate care team members and the patient's provider was performed as part of comprehensive patient evaluation and provision of chronic care management services.    Goals Addressed            This Visit's Progress    Medication Assistance       Current Barriers:   Financial  Limited vision  Pharmacist Clinical Goal(s):   Over the next 30 days, patient will work with CM Pharmacist to address needs related to medication assistance  Interventions:  Counsel on importance of checking blood sugar, blood pressure and weight daily and keeping log  Lucas Caldwell reports that she will further assist Lucas Caldwell with recording his readings to ensure that the readings are legible  Reports that patient checked his blood pressure today: 128/60, HR 90.  Reports that patient has been checking his weight daily each morning as directed and that he has followed directions from Cardiologist and it has  not exceeded 194 lbs in the past week  Medication Assistance ? Advise Lucas Caldwell that Lucas Caldwell has been been approved to receive Trulicity 7.67 mg through the [employer] patient assistance program and that the medication will be shipped to the office. ? Advise Lucas Caldwell to call office tomorrow to see if Trulicity has arrived from assistance program. If not, let her know that per PCP she can pick up a box of sample pens (2 pens/box) for patient.  ? Continue to collaborate with [facility] CPhT to assist patient with patient assistance applications for Dulera and Proventil  Collaborated with PCP regarding switching patient's long-acting insulin from Levemir to [place] insulin for patient to receive his insulin through patient assistance.  PCP agrees to switch for patient to have medication assistance.  PCP to send prescription for [name] to [employer] via Rx Refill form from [employer]  CM Pharmacist to follow up with [facility]dispensiing [facility] next week  Patient Self Care Activities:   Gather necessary documents needed to apply for medication assistance  Check blood sugar, blood pressure and daily weight as directed by providers Date Fasting Blood Glucose  21 - May 140  22 - May 123  23 - May 160  24 - May 195  25 - May 156  26 - May 154  27 - May 164  28 - May 159  Average 156       Takes medications, with assistance of wife, as directed  Patient attends scheduled medical appointments  Please see past updates related to this goal by clicking on the "Past Updates" button in the selected  goal         Plan  The care management team will reach out to the patient again over the next 7 days.   [name], PharmD, [place] [employer] [phone]

## 2018-08-14 NOTE — Patient Instructions (Signed)
Thank you allowing the Chronic Care Management Team to be a part of your care! It was a pleasure speaking with you today!     CCM (Chronic Care Management) Team    Janci Minor RN, BSN Nurse Care Coordinator  [phone]   [name] PharmD  Clinical Pharmacist  [phone]   Eula Fried LCSW Clinical Social Worker [phone]  Visit Information  Goals Addressed            This Visit's Progress   . Medication Assistance       Current Barriers:  . Financial . Limited vision  Pharmacist Clinical Goal(s):  [name] Over the next 30 days, patient will work with CM Pharmacist to address needs related to medication assistance  Interventions:  Medication Assistance ? Advise Mrs. Lansing that I spoke [facility] CPhT and patient's Trulicity will not be shipped out until Monday, 08/17/18. ? Advise Mrs. Tancredi that per PCP she can pick up a box of sample pens (2 pens/box) for patient today. Advise her of today's office hours and to call the office when she arrives.  ? Continue to collaborate with [facility] CPhT to assist patient with patient assistance applications for [facility] and Proventil ? Advise Mrs. Borgmeyer that [employer] has sent patient the attestation letter on 5/26. Ask Mrs. Varone to watch for this letter from [employer] in the mail and then call [place] to assist her with completing it.  Collaborated with PCP regarding switching patient's long-acting insulin from Levemir to [place] insulin for patient to receive his insulin through patient assistance.  PCP agrees to switch for patient to have medication assistance.  PCP to send prescription for [name] to [employer] via Rx Refill form from [employer]  CM Pharmacist to follow up with [facility] (dispens[facility]ing [facility]) next week  Patient Self Care Activities:  [name] Gather necessary documents needed to apply for medication assistance . Check blood sugar, blood pressure and daily weight as directed by providers    . Takes  medications, with assistance of wife, as directed . Patient attends scheduled medical appointments  Please see past updates related to this goal by clicking on the "Past Updates" button in the selected goal         The patient verbalized understanding of instructions provided today and declined a print copy of patient instruction materials.   The care management team will reach out to the patient again over the next 14 days.   [name], PharmD, [place] [employer] [phone]

## 2018-08-14 NOTE — Patient Instructions (Signed)
Thank you allowing the Chronic Care Management Team to be a part of your care! It was a pleasure speaking with you today!     CCM (Chronic Care Management) Team    Janci Minor RN, BSN Nurse Care Coordinator  [phone]   [name] PharmD  Clinical Pharmacist  [phone]   Eula Fried LCSW Clinical Social Worker [phone]  Visit Information  Goals Addressed            This Visit's Progress   . Medication Assistance       Current Barriers:  . Financial . Limited vision  Pharmacist Clinical Goal(s):  [name] Over the next 30 days, patient will work with CM Pharmacist to address needs related to medication assistance  Interventions:  Counsel on importance of checking blood sugar, blood pressure and weight daily and keeping log  Mrs. Kray reports that she will further assist Mr. Smyser with recording his readings to ensure that the readings are legible  Reports that patient checked his blood pressure today: 128/60, HR 90.  Reports that patient has been checking his weight daily each morning as directed and that he has followed directions from Cardiologist and it has not exceeded 194 lbs in the past week  Medication Assistance ? Advise Mrs. Mom that Mr. Wedemeyer has been been approved to receive Trulicity 6.25 mg through the [employer] patient assistance program and that the medication will be shipped to the office. ? Advise Mrs. Nidiffer to call office tomorrow to see if Trulicity has arrived from assistance program. If not, let her know that per PCP she can pick up a box of sample pens (2 pens/box) for patient.  ? Continue to collaborate with [facility] CPhT to assist patient with patient assistance applications for Dulera and Proventil  Collaborated with PCP regarding switching patient's long-acting insulin from Levemir to [place] insulin for patient to receive his insulin through patient assistance.  PCP agrees to switch for patient to have medication  assistance.  PCP to send prescription for [name] to [employer] via Rx Refill form from [employer]  CM Pharmacist to follow up with [facility] (dispens[facility]ing pharmacy) next week  Patient Self Care Activities:  [name] Gather necessary documents needed to apply for medication assistance . Check blood sugar, blood pressure and daily weight as directed by providers Date Fasting Blood Glucose  21 - May 140  22 - May 123  23 - May 160  24 - May 195  25 - May 156  26 - May 154  27 - May 164  28 - May 159  Average 156      . Takes medications, with assistance of wife, as directed . Patient attends scheduled medical appointments  Please see past updates related to this goal by clicking on the "Past Updates" button in the selected goal         The patient verbalized understanding of instructions provided today and declined a print copy of patient instruction materials.   The care management team will reach out to the patient again over the next 7 days.   [name], PharmD, [place] [employer] [phone]

## 2018-08-14 NOTE — Patient Outreach (Signed)
[facility]) Care Management  08/14/2018  Lucas Caldwell 1933-06-06 [id number]  ADDENDUM  Care coordination call placed to [employer] patient assistance in regards to patient's application for [facility] and Proventil.  Spoke to [place] who informed the application was received on 08/11/2018 and the attestation letter was sent to the patient on that day. She informed it can take anywhere from 7-14 business days for the patient to receive that attestation letter. Once the letter is received back to [employer] then the application can continue to be processed.  Will followup with patient in 3-7 business days to inquire if they have received the letter.  Lucas Caldwell P. Danicia Terhaar, [place] Management [phone]

## 2018-08-17 ENCOUNTER — Other Ambulatory Visit: Payer: Self-pay | Admitting: Family Medicine

## 2018-08-17 DIAGNOSIS — IMO0002 Reserved for concepts with insufficient information to code with codable children: Secondary | ICD-10-CM

## 2018-08-17 DIAGNOSIS — E1121 Type 2 diabetes mellitus with diabetic nephropathy: Secondary | ICD-10-CM

## 2018-08-18 ENCOUNTER — Telehealth: Payer: Self-pay | Admitting: Internal Medicine

## 2018-08-18 MED ORDER — TORSEMIDE 20 MG PO TABS: 20.0000 mg | ORAL_TABLET | Freq: Two times a day (BID) | ORAL | 3 refills | Status: DC

## 2018-08-18 NOTE — Telephone Encounter (Signed)
Called and discussed with patient and wife. They verbalized understanding to increase torsemide to 40 mg two times a day until weight returns to 190-192lb. Also, scheduled patient to come in office on 6/12 for an appointment.      COVID-19 Pre-Screening Questions:  . In the past 7 to 10 days have you had a cough,  shortness of breath, headache, congestion, fever (100 or greater) body aches, chills, sore throat, or sudden loss of taste or sense of smell?  no . Have you been around anyone with known Covid 19.  no . Have you been around anyone who is awaiting Covid 19 test results in the past 7 to 10 days?  no Have you been around anyone who has been exposed to Covid 19, or has mentioned symptoms of Covid 19 within the past 7 to 10 days?  No   Patient/wife are aware of process of entering the hospital and only patient able to come to the office for the visit.

## 2018-08-18 NOTE — Telephone Encounter (Signed)
Patient spouse calling to check on status  

## 2018-08-18 NOTE — Telephone Encounter (Signed)
Please have Mr. Bangerter increase his torsemide to 40 mg BID until weight is back to baseline (190-192) and have him f/u with me or an APP in the office next week.  Thanks.  [name], MD [facility] Pager: [phone]

## 2018-08-18 NOTE — Telephone Encounter (Signed)
Spoke with patient and wife. Last Telemedicine visit on 5/1: Patient instructed to take torsemide 40 mg in the morning and 20 mg in the evening until weight back down to 190-192 lb. Patient has been taking medication this way since then. Wife states patient got down to 193 lb last week but today is back up to 195lb. Patient denies swelling or chest pain. Reports shortness of breath feels increased compared to his normal. Has occasional dizziness. Recent BP 154/79.  Advised I will route to Dr End for further advice.

## 2018-08-18 NOTE — Telephone Encounter (Signed)
Pt wife states pt took 2 Torsemide yesterday and asks if that was too many to take. Please advise.

## 2018-08-18 NOTE — Telephone Encounter (Signed)
No answer. Left message to call back.   

## 2018-08-19 ENCOUNTER — Other Ambulatory Visit: Payer: Self-pay | Admitting: Pharmacy Technician

## 2018-08-19 DIAGNOSIS — E119 Type 2 diabetes mellitus without complications: Secondary | ICD-10-CM | POA: Diagnosis not present

## 2018-08-19 DIAGNOSIS — H35342 Macular cyst, hole, or pseudohole, left eye: Secondary | ICD-10-CM | POA: Diagnosis not present

## 2018-08-19 DIAGNOSIS — H35372 Puckering of macula, left eye: Secondary | ICD-10-CM | POA: Diagnosis not present

## 2018-08-19 DIAGNOSIS — H1859 Other hereditary corneal dystrophies: Secondary | ICD-10-CM | POA: Diagnosis not present

## 2018-08-19 NOTE — Patient Outreach (Signed)
[place] [facility]) Care Management  08/19/2018  Lucas Caldwell 10-Mar-1934 [id number]    Successful outreach call placed to patient in regards to [employer] application for Trulicity and Scientist, clinical (histocompatibility and immunogenetics) for [employer].  Spoke to patient's wife, HIPAA identifiers verified.  [name] informed that she has received the Trulicity from the provider's office. She informed she received 4 boxes. Discussed with patient's wife how to obtain the refills. Informed patient's wife to make sure she outreaches to his providers's office when he has a 2 week supply left to avoid running out of medication especially if she has not heard from them about delivery. Informed patient's wife that as long as she goes thru the program, his medication would be free of charge for the remainder of the year.  [name] verbalized understanding.  [name] informed she had received a letter from [employer] patient assistance. Informed patient that [employer] was the company that we are trying to get the patient's inhalers through so he doesn't have to pay out of pocket for them. The patient's wife and I went through the letter together and she was able to answer the questions. She informed that she would have her husband, the patient, sign and date the form. She informed she would place the application and the form in the prepaid envelope provided by [employer] and mail it back to them.  Will followup with [employer] in 7-14 business days to inquire if they have received the attestation letter back and mailed out the medication.  Anali Cabanilla P. [name], [place] Management [phone]

## 2018-08-20 ENCOUNTER — Other Ambulatory Visit: Payer: Self-pay | Admitting: Pharmacy Technician

## 2018-08-20 NOTE — Patient Outreach (Signed)
[place] [facility]) Care Management  08/20/2018  Lucas Caldwell 01/03/1934 [id number]   Care coordination call placed to [place] in regards to newly added [name] medication to an already approved account.  Spoke to [place] who informed they had received the information regarding the addition of the [name] to the patient's current enrollment on 08/19/2018. He informed he would send an urgent request to the processing team to have them go ahead and process the add on. He informed this could take 2-3 business days but it may occur sooner.  Will follow up with [employer] in 2-3 business days to inquire on status of application.  Mccormick Macon P. Soo Steelman, [employer] Management [phone]

## 2018-08-24 ENCOUNTER — Ambulatory Visit (INDEPENDENT_AMBULATORY_CARE_PROVIDER_SITE_OTHER): Payer: Medicare HMO | Admitting: Pharmacist

## 2018-08-24 ENCOUNTER — Ambulatory Visit (INDEPENDENT_AMBULATORY_CARE_PROVIDER_SITE_OTHER): Payer: Medicare HMO | Admitting: Family Medicine

## 2018-08-24 ENCOUNTER — Other Ambulatory Visit: Payer: Self-pay | Admitting: Pharmacy Technician

## 2018-08-24 ENCOUNTER — Other Ambulatory Visit: Payer: Self-pay

## 2018-08-24 ENCOUNTER — Encounter: Payer: Self-pay | Admitting: Family Medicine

## 2018-08-24 VITALS — BP 122/66 | HR 90 | Temp 98.8°F | Resp 16 | Ht 64.0 in | Wt 198.0 lb

## 2018-08-24 DIAGNOSIS — IMO0002 Reserved for concepts with insufficient information to code with codable children: Secondary | ICD-10-CM

## 2018-08-24 DIAGNOSIS — Z89411 Acquired absence of right great toe: Secondary | ICD-10-CM | POA: Insufficient documentation

## 2018-08-24 DIAGNOSIS — E1121 Type 2 diabetes mellitus with diabetic nephropathy: Secondary | ICD-10-CM

## 2018-08-24 DIAGNOSIS — E1165 Type 2 diabetes mellitus with hyperglycemia: Secondary | ICD-10-CM

## 2018-08-24 DIAGNOSIS — E1151 Type 2 diabetes mellitus with diabetic peripheral angiopathy without gangrene: Secondary | ICD-10-CM | POA: Diagnosis not present

## 2018-08-24 DIAGNOSIS — E1142 Type 2 diabetes mellitus with diabetic polyneuropathy: Secondary | ICD-10-CM | POA: Diagnosis not present

## 2018-08-24 LAB — POCT GLYCOSYLATED HEMOGLOBIN (HGB A1C): Hemoglobin A1C: 8.3 % — AB (ref 4.0–5.6)

## 2018-08-24 NOTE — Progress Notes (Addendum)
Subjective:    Patient ID: Lucas Caldwell, male    DOB: [birth year], 85 y.o.   MRN: [id number]  Lucas Caldwell is a 85 y.o. male presenting on 08/24/2018 for Diabetes  Patient accompanied by wife, Seymour.  HPI   CHRONIC DM, Type 2/ CKD-III Nephropathyand Neuropathy / Partial Ray Amputation R Great Toe Due for A1c today, see result below, previous result 8.7 [name] doing well with sugar, but admits it is sporadic sometimes CBG readings avg 443-154  Meds:- Trulicity 0.75mg  weekly inj Saturday (now has supply of med after app for med assistance), Levemir 60u daily (awaiting new Basgalar soon) Reports good compliance. Tolerating well w/o side-effects Currently on ACEi Lifestyle: - Diet (still trying to improve diet, but not always adhering to low carb diet, drinks some water) - Exercise (limited by dyspnea) - Admits chronicDM Neuropathywith pain in feet bilateral,on Gabapentin with improvement, taking 300mg  x 2 pills twice a day - Followed by Podiatry, he is s/p R great toe amputation partial - next visit with podiatry [facility] - Dr troxler on 09/16/18 - increased pain in R 2nd toe - He needs diabetic shoes today, due to foot pain and neuropathy, requesting order today, he has form from [place] locally - Denies hypoglycemia, nausea vomiting, fever chills, swelling, polyuria   Depression screen [facility] 2/9 08/24/2018 06/09/2018 06/01/2018  Decreased Interest 0 0 0  Down, Depressed, Hopeless 0 0 0  PHQ - 2 Score 0 0 0    Social History   Tobacco Use  . Smoking status: Former Smoker    Packs/day: 2.00    Years: 15.00    Pack years: 30.00    Types: Cigarettes  . Smokeless tobacco: Former Systems developer  . Tobacco comment: quit 1979  Substance Use Topics  . Alcohol use: No    Comment: previously drank heavily - quit 1979.  . Drug use: No    Review of Systems Per HPI unless specifically indicated above     Objective:    BP 122/66   Pulse 90   Temp 98.8 F (37.1 C) (Oral)   Resp  16   Ht 5\' 4"  (1.626 m)   Wt 198 lb (89.8 kg)   BMI 33.99 kg/m   Wt Readings from Last 3 Encounters:  08/24/18 198 lb (89.8 kg)  07/17/18 195 lb 4 oz (88.6 kg)  07/10/18 192 lb (87.1 kg)    Physical Exam Vitals signs and nursing note reviewed.  Constitutional:      General: [name] not in acute distress.    Appearance: [name] well-developed. [name] not diaphoretic.     Comments: Well-appearing, comfortable, cooperative  HENT:     Head: Normocephalic and atraumatic.  Eyes:     General:        Right eye: No discharge.        Left eye: No discharge.     Conjunctiva/sclera: Conjunctivae normal.  Cardiovascular:     Rate and Rhythm: Normal rate.  Pulmonary:     Effort: Pulmonary effort is normal.  Skin:    General: Skin is warm and dry.     Findings: No erythema or rash.  Neurological:     Mental Status: [name] alert and oriented to person, place, and time.  Psychiatric:        Behavior: Behavior normal.     Comments: Well groomed, good eye contact, normal speech and thoughts      Diabetic Foot Exam - Simple  Simple Foot Form Diabetic Foot exam was performed with the following findings:  Yes 08/24/2018  3:14 PM  Visual Inspection See comments:  Yes Sensation Testing See comments:  Yes Pulse Check Posterior Tibialis and Dorsalis pulse intact bilaterally:  Yes Comments Right foot s/p distal amputation of R Great toe only partial toe remaining without nailbed, long since healed without other complication but it leaves deformity of R foot. Bilateral callus formation heels both feet, and some mild R forefoot. Bilateral feet with significant reduced sensation to monofilament testing R foot is absent sensation plantar but has some mild intact dorsal, Left foot is mostly reduced plantar except area of great toe forefoot and dorsal aspect.       Recent Labs    10/24/17 1616 05/06/18 1433 08/24/18 1514  HGBA1C 10.3* 8.7* 8.3*    Results for orders placed or performed in visit  on 08/24/18  POCT HgB A1C  Result Value Ref Range   Hemoglobin A1C 8.3 (A) 4.0 - 5.6 %      Assessment & Plan:   Problem List Items Addressed This Visit    Diabetic neuropathy associated with type 2 diabetes mellitus ([place])    Chronic bilateral diabetic neuropathy in both feet, as complication of diabetes Additionally with R toe deformity, s/p partial amputation of R Great toe Evidence of callus formation both feet, heels and mid foot  Completed DM Foot Exam today in office, 08/24/18. See exam note.  Plan - Proceed with ordering Diabetic Shoes, completed form today, will fax back to [place] along with this office note - Patient would benefit from Diabetic Shoes due to his neuropathy with callus formation and great toe deformity causing him pain, his diabetes control is improving on current regimen, and I am continuing to monitor and manage his diabetes.      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 Discussed DM management concern with CKD and risk of hypoglycemia - limited med options due to CKD - CONTINUE Trulicity 0.75mg  weekly, Levemir 60u daily (awaiting [name] insulin financial assistance in mail, would use same dose 60u daily) - No detailed CBG readings but they have reported several readings in past week, reviewed report on chart as well - Counseling on risk of hypoglycemia and symptoms 2. Encourage improved lifestyle - keep improving diet - limit carbs, limit sweet tea, inc water - he has poor health literacy on diabetes Follow-up 3 months for DM A1c,  med adjust  Future reconsider dose adjust on GLP1 trulicity up to 1.5 dose if needed, caution with some side effect nausea now  Need for diabetic shoes, see A&P below      Relevant Medications   enalapril (VASOTEC) 5 MG tablet   Other Relevant Orders   POCT HgB A1C (Completed)      No orders of the defined types were placed in this encounter.    Follow up plan: Return in about 3 months (around 11/24/2018) for DM A1c, neuropathy.   [name], [facility] 08/24/2018, 2:59 PM

## 2018-08-24 NOTE — Patient Instructions (Addendum)
Thank you for coming to the office today.  Recent Labs    10/24/17 1616 05/06/18 1433 08/24/18 1514  HGBA1C 10.3* 8.7* 8.3*   Keep on current medications Trulicity and [name].  Diabetic shoes ordered to [facility], stay tuned.  Please schedule a Follow-up Appointment to: Return in about 3 months (around 11/24/2018) for DM A1c, neuropathy.  If you have any other questions or concerns, please feel free to call the office or send a message through [place]. You may also schedule an earlier appointment if necessary.  Additionally, you may be receiving a survey about your experience at our office within a few days to 1 week by e-mail or mail. We value your feedback.  [name], DO [place]

## 2018-08-24 NOTE — Patient Outreach (Signed)
[place] [facility]) Care Management  08/24/2018  MAKIH STEFANKO [birth year] [id number]   Care coordination call placed to [place] in regards to [facility] application.  Spoke  Alinda Sierras who informed patient had been APPROVED 08/20/2018-03/18/2019. Alinda Sierras informed an order had been placed but the provider would need to call in to set up a delivery.  Will outreach embedded [place] with assistance.  Charnel Giles P. Avelynn Sellin, [place] Management [phone]

## 2018-08-24 NOTE — Chronic Care Management (AMB) (Signed)
  Chronic Care Management   Follow Up Note   08/24/2018 Name: Lucas Caldwell MRN: [id number] DOB: 1933-08-07  Referred by: Lucas Hauser, DO Reason for referral : Care Coordination ([facility] -Patient Assistance)   Lucas Caldwell is a 85 y.o. year old male who is a primary care patient of Lucas Hauser, DO. The CCM team was consulted for assistance with chronic disease management and care coordination needs.  Lucas Caldwell has a past medical history including but not limited to type 2 diabetes, CKD, hyperlipidemia, COPD, atrial fibrillation, hypertension, coronary artery disease, Chronic HFpEF, hypothyroidism, and CML  Call to [facility] (dispensing pharmacy for [employer] Patient Assistance Program) to follow up regarding patient assistance for [name].  Review of patient status, including review of consultants reports, relevant laboratory and other test results, and collaboration with appropriate care team members and the patient's provider was performed as part of comprehensive patient evaluation and provision of chronic care management services.    Goals Addressed            This Visit's Progress   . Medication Assistance       Current Barriers:  . Financial . Limited vision  Pharmacist Clinical Goal(s):  [name] Over the next 30 days, patient will work with CM Pharmacist to address needs related to medication assistance  Interventions:  Medication Assistance ? Call to [facility] (dispensing pharmacy for [facility] Patient Assistance Program) to follow up regarding patient assistance for [name]. Speak with [facility] Lucas Caldwell, to confirm details of prescription as directed by Lucas Caldwell and confirm that medication is to be shipped to the office. ? Per program representative, patient's [name] prescription scheduled to ship within 2 days, expected to arrive to office by Friday. ? Continue to collaborate with Lucas Caldwell to assist patient  with patient assistance applications for [facility] and Provent[facility]  Patient Self Care Activities:  [name] Gather necessary documents needed to apply for medication assistance . Check blood sugar, blood pressure and daily weight as directed by providers    . Takes medications, with assistance of wife, as directed . Patient attends scheduled medical appointments  Please see past updates related to this goal by clicking on the "Past Updates" button in the selected goal         Plan  The care management team will reach out to the patient again over the next 7 days.   Lucas Caldwell, PharmD, [place] [employer] [phone]

## 2018-08-24 NOTE — Assessment & Plan Note (Signed)
Chronic bilateral diabetic neuropathy in both feet, as complication of diabetes Additionally with R toe deformity, s/p partial amputation of R Great toe Evidence of callus formation both feet, heels and mid foot  Completed DM Foot Exam today in office, 08/24/18. See exam note.  Plan - Proceed with ordering Diabetic Shoes, completed form today, will fax back to [place] along with this office note - Patient would benefit from Diabetic Shoes due to his neuropathy with callus formation and great toe deformity causing him pain, his diabetes control is improving on current regimen, and I am continuing to monitor and manage his diabetes.

## 2018-08-24 NOTE — Assessment & Plan Note (Signed)
Still improving control A1c down to 8.3, previously 8.7 and before that 10.3 Improving adherence on medication now with financial assistance and lifestyle improving Complications - hypoglycemia (improved), CKD-III, peripheral neuropathy, cataracts, CAD  Plan:  1. Discussed DM management concern with CKD and risk of hypoglycemia - limited med options due to CKD - CONTINUE Trulicity 0.75mg  weekly, Levemir 60u daily (awaiting [name] insulin financial assistance in mail, would use same dose 60u daily) - No detailed CBG readings but they have reported several readings in past week, reviewed report on chart as well - Counseling on risk of hypoglycemia and symptoms 2. Encourage improved lifestyle - keep improving diet - limit carbs, limit sweet tea, inc water - he has poor health literacy on diabetes Follow-up 3 months for DM A1c, med adjust  Future reconsider dose adjust on GLP1 trulicity up to 1.5 dose if needed, caution with some side effect nausea now  Need for diabetic shoes, see A&P below

## 2018-08-26 ENCOUNTER — Telehealth: Payer: Self-pay | Admitting: *Deleted

## 2018-08-26 DIAGNOSIS — L97409 Non-pressure chronic ulcer of unspecified heel and midfoot with unspecified severity: Secondary | ICD-10-CM | POA: Diagnosis not present

## 2018-08-26 DIAGNOSIS — E1121 Type 2 diabetes mellitus with diabetic nephropathy: Secondary | ICD-10-CM | POA: Diagnosis not present

## 2018-08-26 NOTE — Telephone Encounter (Signed)

## 2018-08-27 NOTE — Progress Notes (Signed)
Follow-up Outpatient Visit Date: 08/28/2018  Primary Care Provider: [name], DO [address] [id number]  Chief Complaint: Chest pain and swelling  HPI:  Lucas Caldwell is a 85 y.o. year-old male with history of coronary artery disease, mixed ischemic and nonischemic cardiomyopathy, hypertension, hyperlipidemia, type 2 diabetes mellitus, paroxysmal atrial fibrillation, chronic kidney disease stage III, CML, iron deficiency anemia,GI bleed due to duodenal ulcers,BPH, and hypothyroidism, who presents for follow-up of chest pain and shortness of breath.  We last spoke in early May, at which time he denied further episodes of chest pain.  His shortness of breath had also improved.  He contacted our office last week, complaining of increasing shortness of breath and weight gain.  I advised him to increase torsemide to 40 mg BID until his weight returns to his baseline (190-192 pounds).  Today, Lucas Caldwell reports that he feels a little better with less shortness of breath since torsemide was increased.  However, he has not lost any weight (196 pounds this morning) and continues to have mild leg edema.  He also reports occasional tightness in the center of his chest.  It does not radiate nor is it exertional.  It typically lasts only a few minutes and has not been severe/long enough for him to take a NTG since our last visit.  [name] concerned, however, because his constellation of symptoms is similar to what he experienced in the past shortly before needing stents.  He reports being compliant with his medications.  He has not had any bleeding, remaining on Caldwell.  --------------------------------------------------------------------------------------------------  Past Medical History:  Diagnosis Date   Arthritis    "probably in his legs" (08/15/2016)   Blind right eye    BPH (benign prostatic hyperplasia)    Breast asymmetry    Left breast is larger, present for several  years.   CAD (coronary artery disease)    a. Cath in the late 90's - reportedly ok;  b. 2014 s/p stenting x 2 @ [facility]; c 08/19/16 Cath/PCI with DES -> RCA, plan to treat LM 70% medically. Seen by surgery and felt to be too high risk for CABG.   Chronic combined systolic (congestive) and diastolic (congestive) heart failure ([place])    a. Previously reduced EF-->50% by echo in 2012;  b. 06/2015 Echo: EF 50-55%  c. 07/2016 Echo: EF 45-50%; d. 12/2017 Echo: EF 55%, Gr1 DD, mild MR.   Chronic kidney disease (CKD), stage IV (severe) (HCC)    CML (chronic myelocytic leukemia) (HCC)    GERD (gastroesophageal reflux disease)    GIB (gastrointestinal bleeding)    a. 12/2017 3 unit PRBC GIB in setting of coumadin-->Endo/colon multiple duodenal ulcers and a single bleeding ulcer in the proximal ascending colon status post hemostatic clipping x2.   Gout    Hyperlipemia    Hypertension    Hypothyroidism    Iron deficiency anemia    Migraine    "in the 1960s" (08/15/2016)   NICM (nonischemic cardiomyopathy) ([place])    a. Previously reduced EF-->50% by echo in 2012;  b. 06/2015 Echo: EF 50-55%, Gr2 DD, mild MR, mildly dil LA, Ao sclerosis, mild TR;  c. 07/2016 Echo: EF 45-50%; d. 12/2017 Echo: EF 55%, Gr1 DD, mild MR.   PAF (paroxysmal atrial fibrillation) (HCC)    a. ? Dx 2014-->s/p DCCV;  b. CHA2DS2VASc = 6--> Coumadin.   Prostate cancer ([place])    RBBB    Type II diabetes mellitus ([place])  Past Surgical History:  Procedure Laterality Date   CATARACT EXTRACTION W/ INTRAOCULAR LENS  IMPLANT, BILATERAL Bilateral    COLONOSCOPY N/A 01/13/2018   Procedure: COLONOSCOPY;  Surgeon: Lin Landsman, MD;  Location: [facility] ENDOSCOPY;  Service: Gastroenterology;  Laterality: N/A;   COLONOSCOPY WITH PROPOFOL N/A 01/01/2018   Procedure: COLONOSCOPY WITH PROPOFOL;  Surgeon: Lin Landsman, MD;  Location: [facility] ENDOSCOPY;  Service: Gastroenterology;  Laterality: N/A;   CORONARY ANGIOPLASTY WITH STENT  PLACEMENT  09/2012   2 stents   CORONARY STENT INTERVENTION N/A 08/19/2016   Procedure: Coronary Stent Intervention;  Surgeon: [name], MD;  Location: [place] CV LAB;  Service: Cardiovascular;  Laterality: N/A;   ESOPHAGEAL MANOMETRY N/A 12/16/2017   Procedure: ESOPHAGEAL MANOMETRY (EM);  Surgeon: Lin Landsman, MD;  Location: [facility] ENDOSCOPY;  Service: Gastroenterology;  Laterality: N/A;   ESOPHAGOGASTRODUODENOSCOPY N/A 12/20/2016   Procedure: ESOPHAGOGASTRODUODENOSCOPY (EGD);  Surgeon: Lin Landsman, MD;  Location: [facility] ENDOSCOPY;  Service: Gastroenterology;  Laterality: N/A;   ESOPHAGOGASTRODUODENOSCOPY N/A 01/13/2018   Procedure: ESOPHAGOGASTRODUODENOSCOPY (EGD);  Surgeon: Lin Landsman, MD;  Location: [facility] ENDOSCOPY;  Service: Gastroenterology;  Laterality: N/A;   ESOPHAGOGASTRODUODENOSCOPY (EGD) WITH PROPOFOL N/A 11/03/2017   Procedure: ESOPHAGOGASTRODUODENOSCOPY (EGD) WITH PROPOFOL;  Surgeon: Lin Landsman, MD;  Location: [facility] ENDOSCOPY;  Service: Gastroenterology;  Laterality: N/A;   EYE SURGERY     HERNIA REPAIR     "navel"   LEFT HEART CATH AND CORONARY ANGIOGRAPHY N/A 08/14/2016   Procedure: Left Heart Cath and Coronary Angiography;  Surgeon: [name], MD;  Location: [facility];  Service: Cardiovascular;  Laterality: N/A;   TOE AMPUTATION Right    "big toe"    Current Meds  Medication Sig   acetaminophen (TYLENOL) 500 MG tablet Take 500 mg by mouth every 6 (six) hours as needed.   Alcohol Swabs 70 % PADS Use when checking blood sugar up to 3x daily   allopurinol (ZYLOPRIM) 100 MG tablet Take 1 tablet (100 mg total) by mouth daily.   Blood Glucose Monitoring Suppl (TRUE METRIX AIR GLUCOSE METER) DEVI 1 Device by Does not apply route 2 (two) times daily.   bosutinib (BOSULIF) 100 MG tablet Take 100 mg by mouth at bedtime. Take with food.    BREO ELLIPTA 100-25 MCG/INH AEPB INHALE 1 PUFF BY MOUTH INTO THE LUNGS ONCE A  DAY   Dulaglutide (TRULICITY) 5.72 IO/0.3TD SOPN Inject 0.75 mg into the skin once a week.   enalapril (VASOTEC) 5 MG tablet Take 5 mg by mouth daily.   gabapentin (NEURONTIN) 300 MG capsule Take 2 capsules (600 mg total) by mouth 2 (two) times daily. Max dose = 1200mg  daily for kidney function   glucose blood (TRUE METRIX BLOOD GLUCOSE TEST) test strip Use as instructed   Insulin Glargine ([name] KWIKPEN) 100 UNIT/ML SOPN Inject 0.6 mLs (60 Units total) into the skin daily.   Insulin Pen Needle 31G X 5 MM MISC 31 g by Does not apply route as directed.   levothyroxine (SYNTHROID, LEVOTHROID) 25 MCG tablet Take 1 tablet (25 mcg total) by mouth daily before breakfast.   loperamide (IMODIUM A-D) 2 MG tablet Take 2 mg by mouth 3 (three) times daily as needed for diarrhea or loose stools.    metoprolol succinate (TOPROL XL) 25 MG 24 hr tablet Take 1 tablet (25 mg total) by mouth daily.   mometasone-formoterol (DULERA) 200-5 MCG/ACT AERO Inhale 2 puffs into the lungs 2 (two) times a day.   Multiple  Vitamins-Minerals (MULTIVITAMIN ADULTS 50+ PO) Take 1 tablet by mouth every morning.   nepafenac (NEVANAC) 0.1 % ophthalmic suspension Place 1 drop into both eyes 3 (three) times daily as needed.   nitroGLYCERIN (NITROSTAT) 0.4 MG SL tablet Place 1 tablet (0.4 mg total) under the tongue every 5 (five) minutes x 3 doses as needed for chest pain.   omeprazole (PRILOSEC) 40 MG capsule TAKE ONE CAPSULE BY MOUTH TWICE DAILY (Patient taking differently: Take 40 mg by mouth daily. )   PROVENTIL HFA 108 (90 Base) MCG/ACT inhaler Inhale 2 puffs into the lungs every 6 (six) hours as needed for wheezing or shortness of breath.   rosuvastatin (CRESTOR) 5 MG tablet Take 1 tablet (5 mg total) by mouth daily.   torsemide (DEMADEX) 20 MG tablet Take 1 tablet (20 mg total) by mouth 2 (two) times daily. Starting 08/18/18, take 40 mg two times a day until weight returns to 190-192lb.   Caldwell (COUMADIN) 3 MG  tablet Take 1 tablet (3 mg total) by mouth daily. Take as directed by the coumadin clinic.    Allergies: Clopidogrel, Ciprofloxacin, Mirtazapine, and Spironolactone  Social History   Tobacco Use   Smoking status: Former Smoker    Packs/day: 2.00    Years: 15.00    Pack years: 30.00    Types: Cigarettes   Smokeless tobacco: Former Systems developer   Tobacco comment: quit 1979  Substance Use Topics   Alcohol use: No    Comment: previously drank heavily - quit 1979.   Drug use: No    Family History  Problem Relation Age of Onset   Brain cancer Father    Diabetes Sister    Heart attack Sister     Review of Systems: A 12-system review of systems was performed and was negative except as noted in the HPI.  --------------------------------------------------------------------------------------------------  Physical Exam: BP 130/70 (BP Location: Left Arm, Patient Position: Sitting, Cuff Size: Normal)    Pulse 82    Ht 5\' 4"  (1.626 m)    Wt 196 lb 8 oz (89.1 kg)    BMI 33.73 kg/m   General:  NAD HEENT: No conjunctival pallor or scleral icterus. Moist mucous membranes.  OP clear. Neck: Supple without lymphadenopathy, thyromegaly, JVD, or HJR. No carotid bruit. Lungs: Normal work of breathing. Clear to auscultation bilaterally without wheezes or crackles. Heart: Regular rate and rhythm without murmurs, rubs, or gallops. Unable to assess PMI due to body habitus. Abd: Bowel sounds present. Soft, NT/ND.  Unable to assess HSM due to body habitus. Ext: Trace pretibial edema bilaterally. 1+ radial pulses bilaterally. Skin: Warm and dry without rash.  EKG:  NSR with RBBB and LAFB.  No significant change since 06/03/2018.  Lab Results  Component Value Date   WBC 6.9 06/03/2018   HGB 11.5 (L) 06/03/2018   HCT 35.5 (L) 06/03/2018   MCV 82 06/03/2018   PLT 193 06/03/2018    Lab Results  Component Value Date   NA 140 06/03/2018   K 3.8 06/03/2018   CL 103 06/03/2018   CO2 20  06/03/2018   BUN 16 06/03/2018   CREATININE 1.33 (H) 06/03/2018   GLUCOSE 136 (H) 06/03/2018   ALT 10 04/22/2018    Lab Results  Component Value Date   CHOL 99 01/08/2017   HDL 21 (L) 01/08/2017   LDLCALC 54 01/08/2017   TRIG 118 01/08/2017   CHOLHDL 4.7 01/08/2017    --------------------------------------------------------------------------------------------------  ASSESSMENT AND PLAN: Coronary artery disease with accelerating angina: Mr.  Caldwell reports continued intermittent chest discomfort as well as shortness of breath despite escalation of diuretic regimen.  I am concerned that this could reflect progression of his CAD.  Given that he is s/p multiple PCI's and also had moderate to severe ostial LMCA disease at the time of his last cath in 07/2016, I believe that non-invasive testing may not be accurate.  We will therefore proceed with left and right heart catheterization and possible PCI next week.  I have reviewed the risks, indications, and alternatives to cardiac catheterization, possible angioplasty, and stenting with the patient. Risks include but are not limited to bleeding, infection, vascular injury, stroke, myocardial infection, arrhythmia, kidney injury, radiation-related injury in the case of prolonged fluoroscopy use, emergency cardiac surgery, and death. The patient understands the risks of serious complication is 1-2 in 2094 with diagnostic cardiac cath and 1-2% or less with angioplasty/stenting.  We will check CBC, BMP, and INR today, as well as arrange for COVID-19 testing.  I will have Lucas Caldwell today and begin ASA 81 mg daily in anticipation of catheterization on Tuesday.  Chronic HFpEF: Weight still remains above baseline despite recent increase in torsemide to 40 mg BID.  Exam is notable for trace pretibial edema but clear lungs and no significant JVD.  We will check a BMP today and assess his hemodynamics with right and left heart  catheterization next week.  In the meantime, we will not make any medication changes; Lucas Caldwell should continue torsemide 40 mg BID.  Paroxysmal atrial fibrillation: EKG today gain shows NSR.  We will hold Caldwell, as above, for upcoming catheterization.  I do not believe that bridging is necessary.  Hypertension: BP is borderline elevation.  No medication changes today pending upcoming catheterization.  Hyperlipidemia: Continue rosuvastatin 5 mg daily.  We will recheck LDL with labs today (it was at goal on last check in 12/2016).  Chronic kidney disease stage III: We will need to monitor renal function and volume status closely with upcoming catheterization.  No medication changes planned at this time.  Follow-up: To be determined based on results of catheterization  [name], MD 08/28/2018 9:42 AM

## 2018-08-27 NOTE — H&P (View-Only) (Signed)
Follow-up Outpatient Visit Date: 08/28/2018  Primary Care Provider: [name], DO [address] [address]  Chief Complaint: Chest pain and swelling  HPI:  Lucas Caldwell is a 85 y.o. year-old male with history of coronary artery disease, mixed ischemic and nonischemic cardiomyopathy, hypertension, hyperlipidemia, type 2 diabetes mellitus, paroxysmal atrial fibrillation, chronic kidney disease stage III, CML, iron deficiency anemia,GI bleed due to duodenal ulcers,BPH, and hypothyroidism, who presents for follow-up of chest pain and shortness of breath.  We last spoke in early May, at which time he denied further episodes of chest pain.  His shortness of breath had also improved.  He contacted our office last week, complaining of increasing shortness of breath and weight gain.  I advised him to increase torsemide to 40 mg BID until his weight returns to his baseline (190-192 pounds).  Today, Lucas Caldwell reports that he feels a little better with less shortness of breath since torsemide was increased.  However, he has not lost any weight (196 pounds this morning) and continues to have mild leg edema.  He also reports occasional tightness in the center of his chest.  It does not radiate nor is it exertional.  It typically lasts only a few minutes and has not been severe/long enough for him to take a NTG since our last visit.  [name] concerned, however, because his constellation of symptoms is similar to what he experienced in the past shortly before needing stents.  He reports being compliant with his medications.  He has not had any bleeding, remaining on warfarin.  --------------------------------------------------------------------------------------------------  Past Medical History:  Diagnosis Date   Arthritis    "probably in his legs" (08/15/2016)   Blind right eye    BPH (benign prostatic hyperplasia)    Breast asymmetry    Left breast is larger, present for several  years.   CAD (coronary artery disease)    a. Cath in the late 90's - reportedly ok;  b. 2014 s/p stenting x 2 @ [facility]; c 08/19/16 Cath/PCI with DES -> RCA, plan to treat LM 70% medically. Seen by surgery and felt to be too high risk for CABG.   Chronic combined systolic (congestive) and diastolic (congestive) heart failure ([place])    a. Previously reduced EF-->50% by echo in 2012;  b. 06/2015 Echo: EF 50-55%  c. 07/2016 Echo: EF 45-50%; d. 12/2017 Echo: EF 55%, Gr1 DD, mild MR.   Chronic kidney disease (CKD), stage IV (severe) (HCC)    CML (chronic myelocytic leukemia) (HCC)    GERD (gastroesophageal reflux disease)    GIB (gastrointestinal bleeding)    a. 12/2017 3 unit PRBC GIB in setting of coumadin-->Endo/colon multiple duodenal ulcers and a single bleeding ulcer in the proximal ascending colon status post hemostatic clipping x2.   Gout    Hyperlipemia    Hypertension    Hypothyroidism    Iron deficiency anemia    Migraine    "in the 1960s" (08/15/2016)   NICM (nonischemic cardiomyopathy) ([place])    a. Previously reduced EF-->50% by echo in 2012;  b. 06/2015 Echo: EF 50-55%, Gr2 DD, mild MR, mildly dil LA, Ao sclerosis, mild TR;  c. 07/2016 Echo: EF 45-50%; d. 12/2017 Echo: EF 55%, Gr1 DD, mild MR.   PAF (paroxysmal atrial fibrillation) (HCC)    a. ? Dx 2014-->s/p DCCV;  b. CHA2DS2VASc = 6--> Coumadin.   Prostate cancer ([place])    RBBB    Type II diabetes mellitus ([place])  Past Surgical History:  Procedure Laterality Date   CATARACT EXTRACTION W/ INTRAOCULAR LENS  IMPLANT, BILATERAL Bilateral    COLONOSCOPY N/A 01/13/2018   Procedure: COLONOSCOPY;  Surgeon: Lin Landsman, MD;  Location: [facility] ENDOSCOPY;  Service: Gastroenterology;  Laterality: N/A;   COLONOSCOPY WITH PROPOFOL N/A 01/01/2018   Procedure: COLONOSCOPY WITH PROPOFOL;  Surgeon: Lin Landsman, MD;  Location: [facility] ENDOSCOPY;  Service: Gastroenterology;  Laterality: N/A;   CORONARY ANGIOPLASTY WITH STENT  PLACEMENT  09/2012   2 stents   CORONARY STENT INTERVENTION N/A 08/19/2016   Procedure: Coronary Stent Intervention;  Surgeon: [name], MD;  Location: [place] CV LAB;  Service: Cardiovascular;  Laterality: N/A;   ESOPHAGEAL MANOMETRY N/A 12/16/2017   Procedure: ESOPHAGEAL MANOMETRY (EM);  Surgeon: Lin Landsman, MD;  Location: [facility] ENDOSCOPY;  Service: Gastroenterology;  Laterality: N/A;   ESOPHAGOGASTRODUODENOSCOPY N/A 12/20/2016   Procedure: ESOPHAGOGASTRODUODENOSCOPY (EGD);  Surgeon: Lin Landsman, MD;  Location: [facility] ENDOSCOPY;  Service: Gastroenterology;  Laterality: N/A;   ESOPHAGOGASTRODUODENOSCOPY N/A 01/13/2018   Procedure: ESOPHAGOGASTRODUODENOSCOPY (EGD);  Surgeon: Lin Landsman, MD;  Location: [facility] ENDOSCOPY;  Service: Gastroenterology;  Laterality: N/A;   ESOPHAGOGASTRODUODENOSCOPY (EGD) WITH PROPOFOL N/A 11/03/2017   Procedure: ESOPHAGOGASTRODUODENOSCOPY (EGD) WITH PROPOFOL;  Surgeon: Lin Landsman, MD;  Location: [facility] ENDOSCOPY;  Service: Gastroenterology;  Laterality: N/A;   EYE SURGERY     HERNIA REPAIR     "navel"   LEFT HEART CATH AND CORONARY ANGIOGRAPHY N/A 08/14/2016   Procedure: Left Heart Cath and Coronary Angiography;  Surgeon: [name], MD;  Location: [place] [facility] LAB;  Service: Cardiovascular;  Laterality: N/A;   TOE AMPUTATION Right    "big toe"    Current Meds  Medication Sig   acetaminophen (TYLENOL) 500 MG tablet Take 500 mg by mouth every 6 (six) hours as needed.   Alcohol Swabs 70 % PADS Use when checking blood sugar up to 3x daily   allopurinol (ZYLOPRIM) 100 MG tablet Take 1 tablet (100 mg total) by mouth daily.   Blood Glucose Monitoring Suppl (TRUE METRIX AIR GLUCOSE METER) DEVI 1 Device by Does not apply route 2 (two) times daily.   bosutinib (BOSULIF) 100 MG tablet Take 100 mg by mouth at bedtime. Take with food.    BREO ELLIPTA 100-25 MCG/INH AEPB INHALE 1 PUFF BY MOUTH INTO THE LUNGS ONCE A  DAY   Dulaglutide (TRULICITY) 6.38 LH/7.3SK SOPN Inject 0.75 mg into the skin once a week.   enalapril (VASOTEC) 5 MG tablet Take 5 mg by mouth daily.   gabapentin (NEURONTIN) 300 MG capsule Take 2 capsules (600 mg total) by mouth 2 (two) times daily. Max dose = 1200mg  daily for kidney function   glucose blood (TRUE METRIX BLOOD GLUCOSE TEST) test strip Use as instructed   Insulin Glargine ([name] KWIKPEN) 100 UNIT/ML SOPN Inject 0.6 mLs (60 Units total) into the skin daily.   Insulin Pen Needle 31G X 5 MM MISC 31 g by Does not apply route as directed.   levothyroxine (SYNTHROID, LEVOTHROID) 25 MCG tablet Take 1 tablet (25 mcg total) by mouth daily before breakfast.   loperamide (IMODIUM A-D) 2 MG tablet Take 2 mg by mouth 3 (three) times daily as needed for diarrhea or loose stools.    metoprolol succinate (TOPROL XL) 25 MG 24 hr tablet Take 1 tablet (25 mg total) by mouth daily.   mometasone-formoterol (DULERA) 200-5 MCG/ACT AERO Inhale 2 puffs into the lungs 2 (two) times a day.   Multiple  Vitamins-Minerals (MULTIVITAMIN ADULTS 50+ PO) Take 1 tablet by mouth every morning.   nepafenac (NEVANAC) 0.1 % ophthalmic suspension Place 1 drop into both eyes 3 (three) times daily as needed.   nitroGLYCERIN (NITROSTAT) 0.4 MG SL tablet Place 1 tablet (0.4 mg total) under the tongue every 5 (five) minutes x 3 doses as needed for chest pain.   omeprazole (PRILOSEC) 40 MG capsule TAKE ONE CAPSULE BY MOUTH TWICE DAILY (Patient taking differently: Take 40 mg by mouth daily. )   PROVENTIL HFA 108 (90 Base) MCG/ACT inhaler Inhale 2 puffs into the lungs every 6 (six) hours as needed for wheezing or shortness of breath.   rosuvastatin (CRESTOR) 5 MG tablet Take 1 tablet (5 mg total) by mouth daily.   torsemide (DEMADEX) 20 MG tablet Take 1 tablet (20 mg total) by mouth 2 (two) times daily. Starting 08/18/18, take 40 mg two times a day until weight returns to 190-192lb.   warfarin (COUMADIN) 3 MG  tablet Take 1 tablet (3 mg total) by mouth daily. Take as directed by the coumadin clinic.    Allergies: Clopidogrel, Ciprofloxacin, Mirtazapine, and Spironolactone  Social History   Tobacco Use   Smoking status: Former Smoker    Packs/day: 2.00    Years: 15.00    Pack years: 30.00    Types: Cigarettes   Smokeless tobacco: Former Systems developer   Tobacco comment: quit 1979  Substance Use Topics   Alcohol use: No    Comment: previously drank heavily - quit 1979.   Drug use: No    Family History  Problem Relation Age of Onset   Brain cancer Father    Diabetes Sister    Heart attack Sister     Review of Systems: A 12-system review of systems was performed and was negative except as noted in the HPI.  --------------------------------------------------------------------------------------------------  Physical Exam: BP 130/70 (BP Location: Left Arm, Patient Position: Sitting, Cuff Size: Normal)    Pulse 82    Ht 5\' 4"  (1.626 m)    Wt 196 lb 8 oz (89.1 kg)    BMI 33.73 kg/m   General:  NAD HEENT: No conjunctival pallor or scleral icterus. Moist mucous membranes.  OP clear. Neck: Supple without lymphadenopathy, thyromegaly, JVD, or HJR. No carotid bruit. Lungs: Normal work of breathing. Clear to auscultation bilaterally without wheezes or crackles. Heart: Regular rate and rhythm without murmurs, rubs, or gallops. Unable to assess PMI due to body habitus. Abd: Bowel sounds present. Soft, NT/ND.  Unable to assess HSM due to body habitus. Ext: Trace pretibial edema bilaterally. 1+ radial pulses bilaterally. Skin: Warm and dry without rash.  EKG:  NSR with RBBB and LAFB.  No significant change since 06/03/2018.  Lab Results  Component Value Date   WBC 6.9 06/03/2018   HGB 11.5 (L) 06/03/2018   HCT 35.5 (L) 06/03/2018   MCV 82 06/03/2018   PLT 193 06/03/2018    Lab Results  Component Value Date   NA 140 06/03/2018   K 3.8 06/03/2018   CL 103 06/03/2018   CO2 20  06/03/2018   BUN 16 06/03/2018   CREATININE 1.33 (H) 06/03/2018   GLUCOSE 136 (H) 06/03/2018   ALT 10 04/22/2018    Lab Results  Component Value Date   CHOL 99 01/08/2017   HDL 21 (L) 01/08/2017   LDLCALC 54 01/08/2017   TRIG 118 01/08/2017   CHOLHDL 4.7 01/08/2017    --------------------------------------------------------------------------------------------------  ASSESSMENT AND PLAN: Coronary artery disease with accelerating angina: Mr.  Caldwell reports continued intermittent chest discomfort as well as shortness of breath despite escalation of diuretic regimen.  I am concerned that this could reflect progression of his CAD.  Given that he is s/p multiple PCI's and also had moderate to severe ostial LMCA disease at the time of his last cath in 07/2016, I believe that non-invasive testing may not be accurate.  We will therefore proceed with left and right heart catheterization and possible PCI next week.  I have reviewed the risks, indications, and alternatives to cardiac catheterization, possible angioplasty, and stenting with the patient. Risks include but are not limited to bleeding, infection, vascular injury, stroke, myocardial infection, arrhythmia, kidney injury, radiation-related injury in the case of prolonged fluoroscopy use, emergency cardiac surgery, and death. The patient understands the risks of serious complication is 1-2 in 1610 with diagnostic cardiac cath and 1-2% or less with angioplasty/stenting.  We will check CBC, BMP, and INR today, as well as arrange for COVID-19 testing.  I will have Lucas Caldwell stop taking warfarin today and begin ASA 81 mg daily in anticipation of catheterization on Tuesday.  Chronic HFpEF: Weight still remains above baseline despite recent increase in torsemide to 40 mg BID.  Exam is notable for trace pretibial edema but clear lungs and no significant JVD.  We will check a BMP today and assess his hemodynamics with right and left heart  catheterization next week.  In the meantime, we will not make any medication changes; Lucas Caldwell should continue torsemide 40 mg BID.  Paroxysmal atrial fibrillation: EKG today gain shows NSR.  We will hold warfarin, as above, for upcoming catheterization.  I do not believe that bridging is necessary.  Hypertension: BP is borderline elevation.  No medication changes today pending upcoming catheterization.  Hyperlipidemia: Continue rosuvastatin 5 mg daily.  We will recheck LDL with labs today (it was at goal on last check in 12/2016).  Chronic kidney disease stage III: We will need to monitor renal function and volume status closely with upcoming catheterization.  No medication changes planned at this time.  Follow-up: To be determined based on results of catheterization  [name], MD 08/28/2018 9:42 AM

## 2018-08-28 ENCOUNTER — Other Ambulatory Visit
Admission: RE | Admit: 2018-08-28 | Discharge: 2018-08-28 | Disposition: A | Discharge status: Home / Self Care | Payer: Medicare HMO | Source: Ambulatory Visit | Attending: Internal Medicine | Admitting: Internal Medicine

## 2018-08-28 ENCOUNTER — Other Ambulatory Visit: Payer: Self-pay

## 2018-08-28 ENCOUNTER — Telehealth: Payer: Self-pay | Admitting: Internal Medicine

## 2018-08-28 ENCOUNTER — Other Ambulatory Visit: Payer: Self-pay | Admitting: Pharmacy Technician

## 2018-08-28 ENCOUNTER — Ambulatory Visit (INDEPENDENT_AMBULATORY_CARE_PROVIDER_SITE_OTHER): Payer: Medicare HMO | Admitting: Internal Medicine

## 2018-08-28 VITALS — BP 130/70 | HR 82 | Ht 64.0 in | Wt 196.5 lb

## 2018-08-28 DIAGNOSIS — N183 Chronic kidney disease, stage 3 unspecified: Secondary | ICD-10-CM

## 2018-08-28 DIAGNOSIS — I25119 Atherosclerotic heart disease of native coronary artery with unspecified angina pectoris: Secondary | ICD-10-CM

## 2018-08-28 DIAGNOSIS — Z1159 Encounter for screening for other viral diseases: Secondary | ICD-10-CM | POA: Diagnosis not present

## 2018-08-28 DIAGNOSIS — Z01812 Encounter for preprocedural laboratory examination: Secondary | ICD-10-CM

## 2018-08-28 DIAGNOSIS — E785 Hyperlipidemia, unspecified: Secondary | ICD-10-CM

## 2018-08-28 DIAGNOSIS — I480 Paroxysmal atrial fibrillation: Secondary | ICD-10-CM

## 2018-08-28 DIAGNOSIS — I10 Essential (primary) hypertension: Secondary | ICD-10-CM

## 2018-08-28 DIAGNOSIS — I5032 Chronic diastolic (congestive) heart failure: Secondary | ICD-10-CM

## 2018-08-28 DIAGNOSIS — Z79899 Other long term (current) drug therapy: Secondary | ICD-10-CM

## 2018-08-28 LAB — CBC
HCT: 40.7 % (ref 39.0–52.0)
Hemoglobin: 12.6 g/dL — ABNORMAL LOW (ref 13.0–17.0)
MCH: 26.4 pg (ref 26.0–34.0)
MCHC: 31 g/dL (ref 30.0–36.0)
MCV: 85.3 fL (ref 80.0–100.0)
Platelets: 199 10*3/uL (ref 150–400)
RBC: 4.77 MIL/uL (ref 4.22–5.81)
RDW: 15.9 % — ABNORMAL HIGH (ref 11.5–15.5)
WBC: 7.9 10*3/uL (ref 4.0–10.5)
nRBC: 0 % (ref 0.0–0.2)

## 2018-08-28 LAB — BASIC METABOLIC PANEL
Anion gap: 14 (ref 5–15)
BUN: 25 mg/dL — ABNORMAL HIGH (ref 8–23)
CO2: 23 mmol/L (ref 22–32)
Calcium: 9.4 mg/dL (ref 8.9–10.3)
Chloride: 99 mmol/L (ref 98–111)
Creatinine, Ser: 1.18 mg/dL (ref 0.61–1.24)
GFR calc Af Amer: >60 mL/min (ref >60)
GFR calc non Af Amer: 57 mL/min — ABNORMAL LOW (ref >60)
Glucose, Bld: 212 mg/dL — ABNORMAL HIGH (ref 70–99)
Potassium: 3.7 mmol/L (ref 3.5–5.1)
Sodium: 136 mmol/L (ref 135–145)

## 2018-08-28 LAB — LIPID PANEL
Cholesterol: 110 mg/dL (ref 0–200)
HDL: 28 mg/dL — ABNORMAL LOW (ref >40)
LDL Cholesterol: 51 mg/dL (ref 0–99)
Total CHOL/HDL Ratio: 3.9 RATIO
Triglycerides: 155 mg/dL — ABNORMAL HIGH (ref <150)
VLDL: 31 mg/dL (ref 0–40)

## 2018-08-28 LAB — HEPATIC FUNCTION PANEL
ALT: 14 U/L (ref 0–44)
AST: 23 U/L (ref 15–41)
Albumin: 3.7 g/dL (ref 3.5–5.0)
Alkaline Phosphatase: 82 U/L (ref 38–126)
Bilirubin, Direct: 0.1 mg/dL (ref 0.0–0.2)
Indirect Bilirubin: 0.4 mg/dL (ref 0.3–0.9)
Total Bilirubin: 0.5 mg/dL (ref 0.3–1.2)
Total Protein: 8 g/dL (ref 6.5–8.1)

## 2018-08-28 LAB — PROTIME-INR
INR: 1.9 — ABNORMAL HIGH (ref 0.8–1.2)
Prothrombin Time: 21.3 seconds — ABNORMAL HIGH (ref 11.4–15.2)

## 2018-08-28 MED ORDER — ASPIRIN EC 81 MG PO TBEC: 81.0000 mg | DELAYED_RELEASE_TABLET | Freq: Every day | ORAL | 3 refills | Status: DC

## 2018-08-28 NOTE — Patient Outreach (Signed)
[facility]) Care Management  08/28/2018  Lucas Caldwell 01-27-1934 [id number]   Care coordination call placed to [employer] patient assistance in regards to patient's application for [name] and Proventil.  Spoke to [address] who informed patient had been APPROVED 08/25/2018-03/18/2019. Myra informed medications should arrive to the patient's home in 14 business days.  Will followup with patient in 10-14 business days to inquire if medication was received.  Lucas Caldwell P. Ndea Kilroy, [place] Management [phone]

## 2018-08-28 NOTE — Patient Instructions (Addendum)
Medication Instructions:  Stop Coumadin Start Aspirin 81 mg Daily If you need a refill on your cardiac medications before your next appointment, please call your pharmacy.   Lab work:TODAY CBC BMP INR If you have labs (blood work) drawn today and your tests are completely normal, you will receive your results only by: [name] MyChart Message (if you have MyChart) OR . A paper copy in the mail If you have any lab test that is abnormal or we need to change your treatment, we will call you to review the results.  Testing/Procedures: [facility] Cardiac Cath Instructions   You are scheduled for a Cardiac Cath on: Tuesday June 16th  Please arrive at 09:30 am on the day of your procedure  Please expect a call from our [place] to pre-register you  Do not eat/drink anything after midnight  Someone will need to drive you home  It is recommended someone be with you for the first 24 hours after your procedure  Wear clothes that are easy to get on/off and wear slip on shoes if possible   Medications bring a current list of all medications with you  _XX__ STOP COUMADIN  _XX__HOLD INSULIN the morning of your procedure.   _XX__ You may take all of your other medications the morning of your procedure with enough water to swallow safely.   Day of your procedure: [facility]  Free valet service is available.  After entering the [place] please check-in at the registration desk (1st desk on your right) to receive your armband. After receiving your armband someone will escort you to the cardiac cath/special procedures waiting area.  The usual length of stay after your procedure is about 2 to 3 hours.  This can vary.  If you have any questions, please call our office at [phone], or you may call the cardiac cath lab at [facility] directly at [phone]  Your physician has requested that you have a cardiac catheterization. Cardiac catheterization is  used to diagnose and/or treat various heart conditions. Doctors may recommend this procedure for a number of different reasons. The most common reason is to evaluate chest pain. Chest pain can be a symptom of coronary artery disease (CAD), and cardiac catheterization can show whether plaque is narrowing or blocking your heart's arteries. This procedure is also used to evaluate the valves, as well as measure the blood flow and oxygen levels in different parts of your heart. For further information please visit [email or web address]. Please follow instruction sheet, as given.    Follow-Up: To Be Determined At [facility], you and your health needs are our priority.  As part of our continuing mission to provide you with exceptional heart care, we have created designated Provider Care Teams.  These Care Teams include your primary Cardiologist (physician) and Advanced Practice Providers (APPs -  Physician Assistants and Nurse Practitioners) who all work together to provide you with the care you need, when you need it. .   Any Other Special Instructions Will Be Listed Below (If Applicable).

## 2018-08-28 NOTE — Telephone Encounter (Signed)
Left voicemail message to call back regarding need for testing.

## 2018-08-28 NOTE — Telephone Encounter (Signed)
Spoke with patient and advised that he need to go to [place] drive thru to have COVID testing for his upcoming procedure and that he needs to get there before 3pm. He states that his wife went to store and he is sleeping. Reviewed the importance of having this test done today in order for him to have his procedure and prevent from having to reschedule. He verbalized understanding and stated that he would try to get here. Spoke with PAT and they are expecting him.

## 2018-08-29 ENCOUNTER — Encounter: Payer: Self-pay | Admitting: Internal Medicine

## 2018-08-29 LAB — NOVEL CORONAVIRUS, NAA (HOSP ORDER, SEND-OUT TO REF LAB; TAT 18-24 HRS): SARS-CoV-2, NAA: NOT DETECTED

## 2018-08-31 ENCOUNTER — Other Ambulatory Visit: Payer: Self-pay | Admitting: Internal Medicine

## 2018-08-31 ENCOUNTER — Ambulatory Visit: Payer: Self-pay | Admitting: Pharmacist

## 2018-08-31 ENCOUNTER — Other Ambulatory Visit: Payer: Self-pay | Admitting: Pharmacy Technician

## 2018-08-31 ENCOUNTER — Telehealth: Payer: Medicare HMO

## 2018-08-31 DIAGNOSIS — IMO0002 Reserved for concepts with insufficient information to code with codable children: Secondary | ICD-10-CM

## 2018-08-31 DIAGNOSIS — E1121 Type 2 diabetes mellitus with diabetic nephropathy: Secondary | ICD-10-CM

## 2018-08-31 NOTE — Patient Instructions (Signed)
Thank you allowing the Chronic Care Management Team to be a part of your care! It was a pleasure speaking with you today!     CCM (Chronic Care Management) Team    Janci Minor RN, BSN Nurse Care Coordinator  [phone]   [name] PharmD  Clinical Pharmacist  [phone]   Eula Fried LCSW Clinical Social Worker [phone]  Visit Information  Goals Addressed            This Visit's Progress   . Medication Assistance       Current Barriers:  . Financial . Limited vision  Pharmacist Clinical Goal(s):  [name] Over the next 30 days, patient will work with CM Pharmacist to address needs related to medication assistance  Interventions:  Perform chart review  Patient scheduled for left and right heart catheterization tomorrow, 6/16, for evaluation of his CAD   Medication Assistance ? Continue to collaborate with [facility] CPhT to assist patient with patient assistance applications for [name], [name] and Proventil ? Patient has been approved for Patient Assistance for [place] - medication to arrive at office by 6/16.  ? Per [facility] CPhT - Dulera and Proventil have been shipped by [employer] patient assistance program (mailed to patient's home)  Patient Self Care Activities:  . Check blood sugar, blood pressure and daily weight as directed by providers    . Takes medications, with assistance of wife, as directed . Patient attends scheduled medical appointments  Please see past updates related to this goal by clicking on the "Past Updates" button in the selected goal         The patient verbalized understanding of instructions provided today and declined a print copy of patient instruction materials.   The care management team will reach out to the patient again over the next 7 days.   [name], PharmD, [place] [employer] [phone]

## 2018-08-31 NOTE — Patient Outreach (Signed)
[facility]) Care Management  08/31/2018  Lucas Caldwell 09-Aug-1933 [id number]    ADDENDUM  Successful outreach call placed to patient in regards to [employer] application for Database administrator for [employer].  Spoke to patient's wife. HIPAA identifiers verified.  Informed patient's wife that after speaking with [place] at [place] this am, [name] is being delivered to Dr. Christella Scheuermann office on 09/01/2018. [name]  verbalized understanding and will pick it up. Informed patient's wife to call before she goes to make sure medication had arrived.  Also informed patient's wife  that the [employer] medications for [facility] and Proventil will be delivered to their home in the next 10-14 business days. [name]  verbalized understanding.  Will followup with patient in 10-14 business days to inquire that all medications were received and to go over the refill process for both companies.Sharee Pimple P. Korryn Pancoast, [place] Management [phone]

## 2018-08-31 NOTE — Telephone Encounter (Signed)
Patient spouse calling Would like to go over instructions once more on what to do prior to procedure  Please call to discuss, if unable to reach [name]'s cell, please call Kaven's cell

## 2018-08-31 NOTE — Chronic Care Management (AMB) (Signed)
  Chronic Care Management   Follow Up Note   08/31/2018 Name: Lucas Caldwell MRN: [id number] DOB: 02/12/1934  Referred by: Lucas Hauser, DO Reason for referral : Chronic Care Management (Patient Phone Call)   Lucas Caldwell is a 85 y.o. year old male who is a primary care patient of Lucas Hauser, DO. The CCM team was consulted for assistance with chronic disease management and care coordination needs.  Lucas Caldwell has a past medical history including but not limited to type 2 diabetes, CKD, hyperlipidemia, COPD, atrial fibrillation, hypertension, coronary artery disease, Chronic HFpEF, hypothyroidism, and CML  Outreach call to Lucas Caldwell today regarding medication management and medication assistance. Lucas Caldwell reports that now is not a good time to talk as he is currently driving.  Review of patient status, including review of consultants reports, relevant laboratory and other test results, and collaboration with appropriate care team members and the patient's provider was performed as part of comprehensive patient evaluation and provision of chronic care management services.    Goals Addressed            This Visit's Progress   . Medication Assistance       Current Barriers:  . Financial . Limited vision  Pharmacist Clinical Goal(s):  [name] Over the next 30 days, patient will work with CM Pharmacist to address needs related to medication assistance  Interventions:  Perform chart review  Patient scheduled for left and right heart catheterization tomorrow, 6/16, for evaluation of his CAD   Medication Assistance ? Continue to collaborate with [facility] CPhT to assist patient with patient assistance applications for [name], Lucas Caldwell and Proventil ? Patient has been approved for Patient Assistance for [place] - medication to arrive at office by 6/16.  ? Per [facility] CPhT - Dulera and Proventil have been shipped by [employer] patient assistance program (mailed to  patient's home)  Patient Self Care Activities:  . Check blood sugar, blood pressure and daily weight as directed by providers    . Takes medications, with assistance of wife, as directed . Patient attends scheduled medical appointments  Please see past updates related to this goal by clicking on the "Past Updates" button in the selected goal         Plan  The care management team will reach out to the patient again over the next 7 days.   [name], PharmD, [place] [employer] [phone]

## 2018-08-31 NOTE — Patient Outreach (Signed)
[facility]) Care Management  08/31/2018  BRAIDEN RODMAN 01-09-1934 [id number]   Care coordination call placed to [employer] in regards to [facility] application for [place].  Spoke to [employer] who informed medication would be delivered 09/01/2018 to HCP office.  Will outreach patient and inform of this information.  Audriella Blakeley P. Gokul Waybright, [place] Management [phone]

## 2018-08-31 NOTE — Telephone Encounter (Signed)
Spoke with wife, DPR on file.  Answered all questions concerning heart cath tomorrow.  Wife verbalized understanding.

## 2018-09-01 ENCOUNTER — Ambulatory Visit
Admission: RE | Admit: 2018-09-01 | Discharge: 2018-09-01 | Disposition: A | Discharge status: Home / Self Care | Payer: Medicare HMO | Attending: Internal Medicine | Admitting: Internal Medicine

## 2018-09-01 ENCOUNTER — Other Ambulatory Visit: Payer: Self-pay

## 2018-09-01 ENCOUNTER — Encounter
Admission: RE | Disposition: A | Discharge status: Home / Self Care | Payer: Medicare HMO | Source: Home / Self Care | Attending: Internal Medicine

## 2018-09-01 DIAGNOSIS — Z79899 Other long term (current) drug therapy: Secondary | ICD-10-CM | POA: Diagnosis not present

## 2018-09-01 DIAGNOSIS — Z7982 Long term (current) use of aspirin: Secondary | ICD-10-CM | POA: Diagnosis not present

## 2018-09-01 DIAGNOSIS — I130 Hypertensive heart and chronic kidney disease with heart failure and stage 1 through stage 4 chronic kidney disease, or unspecified chronic kidney disease: Secondary | ICD-10-CM | POA: Insufficient documentation

## 2018-09-01 DIAGNOSIS — Z7951 Long term (current) use of inhaled steroids: Secondary | ICD-10-CM | POA: Insufficient documentation

## 2018-09-01 DIAGNOSIS — E039 Hypothyroidism, unspecified: Secondary | ICD-10-CM | POA: Insufficient documentation

## 2018-09-01 DIAGNOSIS — I2510 Atherosclerotic heart disease of native coronary artery without angina pectoris: Secondary | ICD-10-CM | POA: Insufficient documentation

## 2018-09-01 DIAGNOSIS — I4510 Unspecified right bundle-branch block: Secondary | ICD-10-CM | POA: Insufficient documentation

## 2018-09-01 DIAGNOSIS — Z794 Long term (current) use of insulin: Secondary | ICD-10-CM | POA: Insufficient documentation

## 2018-09-01 DIAGNOSIS — I25119 Atherosclerotic heart disease of native coronary artery with unspecified angina pectoris: Secondary | ICD-10-CM

## 2018-09-01 DIAGNOSIS — E785 Hyperlipidemia, unspecified: Secondary | ICD-10-CM | POA: Diagnosis not present

## 2018-09-01 DIAGNOSIS — N400 Enlarged prostate without lower urinary tract symptoms: Secondary | ICD-10-CM | POA: Insufficient documentation

## 2018-09-01 DIAGNOSIS — Z881 Allergy status to other antibiotic agents status: Secondary | ICD-10-CM | POA: Diagnosis not present

## 2018-09-01 DIAGNOSIS — I480 Paroxysmal atrial fibrillation: Secondary | ICD-10-CM | POA: Insufficient documentation

## 2018-09-01 DIAGNOSIS — I25118 Atherosclerotic heart disease of native coronary artery with other forms of angina pectoris: Secondary | ICD-10-CM | POA: Diagnosis present

## 2018-09-01 DIAGNOSIS — H5461 Unqualified visual loss, right eye, normal vision left eye: Secondary | ICD-10-CM | POA: Insufficient documentation

## 2018-09-01 DIAGNOSIS — Z7901 Long term (current) use of anticoagulants: Secondary | ICD-10-CM | POA: Insufficient documentation

## 2018-09-01 DIAGNOSIS — C61 Malignant neoplasm of prostate: Secondary | ICD-10-CM | POA: Insufficient documentation

## 2018-09-01 DIAGNOSIS — Z87891 Personal history of nicotine dependence: Secondary | ICD-10-CM | POA: Insufficient documentation

## 2018-09-01 DIAGNOSIS — I5032 Chronic diastolic (congestive) heart failure: Secondary | ICD-10-CM

## 2018-09-01 DIAGNOSIS — I5042 Chronic combined systolic (congestive) and diastolic (congestive) heart failure: Secondary | ICD-10-CM | POA: Insufficient documentation

## 2018-09-01 DIAGNOSIS — K219 Gastro-esophageal reflux disease without esophagitis: Secondary | ICD-10-CM | POA: Insufficient documentation

## 2018-09-01 DIAGNOSIS — E1122 Type 2 diabetes mellitus with diabetic chronic kidney disease: Secondary | ICD-10-CM | POA: Insufficient documentation

## 2018-09-01 DIAGNOSIS — Z79890 Hormone replacement therapy (postmenopausal): Secondary | ICD-10-CM | POA: Insufficient documentation

## 2018-09-01 DIAGNOSIS — N184 Chronic kidney disease, stage 4 (severe): Secondary | ICD-10-CM | POA: Diagnosis not present

## 2018-09-01 DIAGNOSIS — M109 Gout, unspecified: Secondary | ICD-10-CM | POA: Diagnosis not present

## 2018-09-01 DIAGNOSIS — R079 Chest pain, unspecified: Secondary | ICD-10-CM

## 2018-09-01 DIAGNOSIS — I255 Ischemic cardiomyopathy: Secondary | ICD-10-CM | POA: Insufficient documentation

## 2018-09-01 DIAGNOSIS — Z955 Presence of coronary angioplasty implant and graft: Secondary | ICD-10-CM | POA: Insufficient documentation

## 2018-09-01 DIAGNOSIS — D509 Iron deficiency anemia, unspecified: Secondary | ICD-10-CM | POA: Insufficient documentation

## 2018-09-01 HISTORY — PX: RIGHT/LEFT HEART CATH AND CORONARY ANGIOGRAPHY: CATH118266

## 2018-09-01 LAB — GLUCOSE, CAPILLARY
Glucose-Capillary: 178 mg/dL — ABNORMAL HIGH (ref 70–99)
Glucose-Capillary: 149 mg/dL — ABNORMAL HIGH (ref 70–99)

## 2018-09-01 LAB — POCT ACTIVATED CLOTTING TIME: Activated Clotting Time: 285 seconds

## 2018-09-01 LAB — PROTIME-INR
INR: 1.3 — ABNORMAL HIGH (ref 0.8–1.2)
Prothrombin Time: 15.9 seconds — ABNORMAL HIGH (ref 11.4–15.2)

## 2018-09-01 SURGERY — RIGHT/LEFT HEART CATH AND CORONARY ANGIOGRAPHY
Anesthesia: Moderate Sedation

## 2018-09-01 MED ORDER — SODIUM CHLORIDE 0.9% FLUSH: 3.0000 mL | INTRAVENOUS | Status: DC | PRN

## 2018-09-01 MED ORDER — MIDAZOLAM HCL 2 MG/2ML IJ SOLN
INTRAMUSCULAR | Status: AC
  Filled 2018-09-01: qty 2

## 2018-09-01 MED ORDER — ASPIRIN 81 MG PO CHEW: 81.0000 mg | CHEWABLE_TABLET | Freq: Every day | ORAL | Status: DC

## 2018-09-01 MED ORDER — IOHEXOL 300 MG/ML  SOLN
INTRAMUSCULAR | Status: DC | PRN
  Administered 2018-09-01: 12:00:00 110 mL via INTRAVENOUS

## 2018-09-01 MED ORDER — SODIUM CHLORIDE 0.9 % IV SOLN: INTRAVENOUS | Status: DC

## 2018-09-01 MED ORDER — SODIUM CHLORIDE 0.9 % WEIGHT BASED INFUSION: 1.0000 mL/kg/h | INTRAVENOUS | Status: DC

## 2018-09-01 MED ORDER — NITROGLYCERIN 1 MG/10 ML FOR IR/CATH LAB
INTRA_ARTERIAL | Status: DC | PRN
  Administered 2018-09-01: 100 ug via INTRACORONARY

## 2018-09-01 MED ORDER — VERAPAMIL HCL 2.5 MG/ML IV SOLN
INTRAVENOUS | Status: AC
  Filled 2018-09-01: qty 2

## 2018-09-01 MED ORDER — VERAPAMIL HCL 2.5 MG/ML IV SOLN
INTRAVENOUS | Status: DC | PRN
  Administered 2018-09-01: 2.5 mg via INTRAVENOUS

## 2018-09-01 MED ORDER — SODIUM CHLORIDE 0.9% FLUSH: 3.0000 mL | Freq: Two times a day (BID) | INTRAVENOUS | Status: DC

## 2018-09-01 MED ORDER — SODIUM CHLORIDE 0.9 % WEIGHT BASED INFUSION
3.0000 mL/kg/h | INTRAVENOUS | Status: AC
  Administered 2018-09-01: 3 mL/kg/h via INTRAVENOUS

## 2018-09-01 MED ORDER — ACETAMINOPHEN 325 MG PO TABS: 650.0000 mg | ORAL_TABLET | ORAL | Status: DC | PRN

## 2018-09-01 MED ORDER — TORSEMIDE 20 MG PO TABS: ORAL_TABLET | ORAL | 3 refills | Status: DC

## 2018-09-01 MED ORDER — HYDRALAZINE HCL 20 MG/ML IJ SOLN: 10.0000 mg | INTRAMUSCULAR | Status: DC | PRN

## 2018-09-01 MED ORDER — HEPARIN SODIUM (PORCINE) 1000 UNIT/ML IJ SOLN
INTRAMUSCULAR | Status: DC | PRN
  Administered 2018-09-01 (×2): 4500 [IU] via INTRAVENOUS

## 2018-09-01 MED ORDER — LIDOCAINE HCL (PF) 1 % IJ SOLN
INTRAMUSCULAR | Status: DC | PRN
  Administered 2018-09-01 (×2): 2 mL

## 2018-09-01 MED ORDER — FENTANYL CITRATE (PF) 100 MCG/2ML IJ SOLN
INTRAMUSCULAR | Status: DC | PRN
  Administered 2018-09-01: 25 ug via INTRAVENOUS

## 2018-09-01 MED ORDER — ONDANSETRON HCL 4 MG/2ML IJ SOLN: 4.0000 mg | Freq: Four times a day (QID) | INTRAMUSCULAR | Status: DC | PRN

## 2018-09-01 MED ORDER — LABETALOL HCL 5 MG/ML IV SOLN: 10.0000 mg | INTRAVENOUS | Status: DC | PRN

## 2018-09-01 MED ORDER — HEPARIN SODIUM (PORCINE) 1000 UNIT/ML IJ SOLN
INTRAMUSCULAR | Status: AC
  Filled 2018-09-01: qty 1

## 2018-09-01 MED ORDER — FENTANYL CITRATE (PF) 100 MCG/2ML IJ SOLN
INTRAMUSCULAR | Status: AC
  Filled 2018-09-01: qty 2

## 2018-09-01 MED ORDER — HEPARIN (PORCINE) IN NACL 1000-0.9 UT/500ML-% IV SOLN
INTRAVENOUS | Status: DC | PRN
  Administered 2018-09-01: 500 mL

## 2018-09-01 MED ORDER — ISOSORBIDE MONONITRATE ER 30 MG PO TB24: 30.0000 mg | ORAL_TABLET | Freq: Every day | ORAL | 3 refills | Status: DC

## 2018-09-01 MED ORDER — SODIUM CHLORIDE 0.9 % IV SOLN: 250.0000 mL | INTRAVENOUS | Status: DC | PRN

## 2018-09-01 MED ORDER — MIDAZOLAM HCL 2 MG/2ML IJ SOLN
INTRAMUSCULAR | Status: DC | PRN
  Administered 2018-09-01: 1 mg via INTRAVENOUS

## 2018-09-01 MED ORDER — HEPARIN (PORCINE) IN NACL 1000-0.9 UT/500ML-% IV SOLN
INTRAVENOUS | Status: AC
  Filled 2018-09-01: qty 1000

## 2018-09-01 MED ORDER — ASPIRIN 81 MG PO CHEW: 81.0000 mg | CHEWABLE_TABLET | ORAL | Status: DC

## 2018-09-01 SURGICAL SUPPLY — 14 items
CATH BALLN WEDGE 5F 110CM (CATHETERS) ×2 IMPLANT
CATH INFINITI 5 FR MPA2 (CATHETERS) ×2 IMPLANT
CATH INFINITI JR4 5F (CATHETERS) ×2 IMPLANT
CATH LAUNCHER 5F EBU3.5 (CATHETERS) ×2 IMPLANT
DEVICE INFLAT 30 PLUS (MISCELLANEOUS) ×2 IMPLANT
DEVICE RAD TR BAND REGULAR (VASCULAR PRODUCTS) ×2 IMPLANT
GLIDESHEATH SLEND SS 6F .021 (SHEATH) ×2 IMPLANT
KIT MANI 3VAL PERCEP (MISCELLANEOUS) ×3 IMPLANT
KIT RIGHT HEART (MISCELLANEOUS) ×3 IMPLANT
PACK CARDIAC CATH (CUSTOM PROCEDURE TRAY) ×3 IMPLANT
PAD ONESTEP ZOLL R SERIES ADT (MISCELLANEOUS) ×2 IMPLANT
SHEATH GLIDE SLENDER 4/5FR (SHEATH) ×2 IMPLANT
WIRE PRESSURE VERRATA (WIRE) ×2 IMPLANT
WIRE ROSEN-J .035X260CM (WIRE) ×2 IMPLANT

## 2018-09-01 NOTE — Discharge Instructions (Signed)
Radial Site Care ° °This sheet gives you information about how to care for yourself after your procedure. Your health care provider may also give you more specific instructions. If you have problems or questions, contact your health care provider. °What can I expect after the procedure? °After the procedure, it is common to have: °· Bruising and tenderness at the catheter insertion area. °Follow these instructions at home: °Medicines °· Take over-the-counter and prescription medicines only as told by your health care provider. °Insertion site care °· Follow instructions from your health care provider about how to take care of your insertion site. Make sure you: °? Wash your hands with soap and water before you change your bandage (dressing). If soap and water are not available, use hand sanitizer. °? Change your dressing as told by your health care provider. °? Leave stitches (sutures), skin glue, or adhesive strips in place. These skin closures may need to stay in place for 2 weeks or longer. If adhesive strip edges start to loosen and curl up, you may trim the loose edges. Do not remove adhesive strips completely unless your health care provider tells you to do that. °· Check your insertion site every day for signs of infection. Check for: °? Redness, swelling, or pain. °? Fluid or blood. °? Pus or a bad smell. °? Warmth. °· Do not take baths, swim, or use a hot tub until your health care provider approves. °· You may shower 24-48 hours after the procedure, or as directed by your health care provider. °? Remove the dressing and gently wash the site with plain soap and water. °? Pat the area dry with a clean towel. °? Do not rub the site. That could cause bleeding. °· Do not apply powder or lotion to the site. °Activity ° °· For 24 hours after the procedure, or as directed by your health care provider: °? Do not flex or bend the affected arm. °? Do not push or pull heavy objects with the affected arm. °? Do not  drive yourself home from the hospital or clinic. You may drive 24 hours after the procedure unless your health care provider tells you not to. °? Do not operate machinery or power tools. °· Do not lift anything that is heavier than 10 lb (4.5 kg), or the limit that you are told, until your health care provider says that it is safe. °· Ask your health care provider when it is okay to: °? Return to work or school. °? Resume usual physical activities or sports. °? Resume sexual activity. °General instructions °· If the catheter site starts to bleed, raise your arm and put firm pressure on the site. If the bleeding does not stop, get help right away. This is a medical emergency. °· If you went home on the same day as your procedure, a responsible adult should be with you for the first 24 hours after you arrive home. °· Keep all follow-up visits as told by your health care provider. This is important. °Contact a health care provider if: °· You have a fever. °· You have redness, swelling, or yellow drainage around your insertion site. °Get help right away if: °· You have unusual pain at the radial site. °· The catheter insertion area swells very fast. °· The insertion area is bleeding, and the bleeding does not stop when you hold steady pressure on the area. °· Your arm or hand becomes pale, cool, tingly, or numb. °These symptoms may represent a serious problem   that is an emergency. Do not wait to see if the symptoms will go away. Get medical help right away. Call your local emergency services (911 in the [place] Do not drive yourself to the hospital. °Summary °· After the procedure, it is common to have bruising and tenderness at the site. °· Follow instructions from your health care provider about how to take care of your radial site wound. Check the wound every day for signs of infection. °· Do not lift anything that is heavier than 10 lb (4.5 kg), or the limit that you are told, until your health care provider says  that it is safe. °This information is not intended to replace advice given to you by your health care provider. Make sure you discuss any questions you have with your health care provider. °Document Released: 04/06/2010 Document Revised: 04/09/2017 Document Reviewed: 04/09/2017 °Elsevier Interactive Patient Education © 2019 Elsevier Inc. ° ° ° °Moderate Conscious Sedation, Adult, Care After °These instructions provide you with information about caring for yourself after your procedure. Your health care provider may also give you more specific instructions. Your treatment has been planned according to current medical practices, but problems sometimes occur. Call your health care provider if you have any problems or questions after your procedure. °What can I expect after the procedure? °After your procedure, it is common: °· To feel sleepy for several hours. °· To feel clumsy and have poor balance for several hours. °· To have poor judgment for several hours. °· To vomit if you eat too soon. °Follow these instructions at home: °For at least 24 hours after the procedure: ° °· Do not: °? Participate in activities where you could fall or become injured. °? Drive. °? Use heavy machinery. °? Drink alcohol. °? Take sleeping pills or medicines that cause drowsiness. °? Make important decisions or sign legal documents. °? Take care of children on your own. °· Rest. °Eating and drinking °· Follow the diet recommended by your health care provider. °· If you vomit: °? Drink water, juice, or soup when you can drink without vomiting. °? Make sure you have little or no nausea before eating solid foods. °General instructions °· Have a responsible adult stay with you until you are awake and alert. °· Take over-the-counter and prescription medicines only as told by your health care provider. °· If you smoke, do not smoke without supervision. °· Keep all follow-up visits as told by your health care provider. This is  important. °Contact a health care provider if: °· You keep feeling nauseous or you keep vomiting. °· You feel light-headed. °· You develop a rash. °· You have a fever. °Get help right away if: °· You have trouble breathing. °This information is not intended to replace advice given to you by your health care provider. Make sure you discuss any questions you have with your health care provider. °Document Released: 12/23/2012 Document Revised: 08/07/2015 Document Reviewed: 06/24/2015 °Elsevier Interactive Patient Education © 2019 Elsevier Inc. ° °

## 2018-09-01 NOTE — Interval H&P Note (Signed)
History and Physical Interval Note:  09/01/2018 10:36 AM  Lucas Caldwell  has presented today for cardiac catheterization, with the diagnosis of accelerating angina and HFpEF.  The various methods of treatment have been discussed with the patient and family. After consideration of risks, benefits and other options for treatment, the patient has consented to  Procedure(s): RIGHT/LEFT HEART CATH AND CORONARY ANGIOGRAPHY (N/A) as a surgical intervention.  The patient's history has been reviewed, patient examined, no change in status, stable for surgery.  I have reviewed the patient's chart and labs.  Questions were answered to the patient's satisfaction.    Cath Lab Visit (complete for each Cath Lab visit)  Clinical Evaluation Leading to the Procedure:   ACS: No.  Non-ACS:    Anginal Classification: CCS IV  Anti-ischemic medical therapy: Maximal Therapy (2 or more classes of medications)  Non-Invasive Test Results: No non-invasive testing performed  Prior CABG: No previous CABG  Lucas Caldwell

## 2018-09-01 NOTE — Brief Op Note (Signed)
BRIEF CARDIAC CATHETERIZATION NOTE  09/01/2018  12:13 PM  PATIENT:  Lucas Caldwell  85 y.o. male  PRE-OPERATIVE DIAGNOSIS:  Accelerating angina and chronic HFpEF  POST-OPERATIVE DIAGNOSIS:  Same  PROCEDURE:  Procedure(s): RIGHT/LEFT HEART CATH AND CORONARY ANGIOGRAPHY (N/A)  SURGEON:  Surgeon(s) and Role:    * Katelen Luepke, MD - Primary  FINDINGS: 1. Multivessel CAD, including 60% ostial LMCA, diffuse proximal and mid LAD disease up to 60%, and 40% in-stent restenosis of more distal mid RCA stent. 2. Widely patent proximal RCA and LCx stents. 3. iFR of LMCA/LAD is significant at 0.86, though iFR pullback suggests that reduced flow is due to diffuse LAD disease with only a small pressure gradient involving the ostial LMCA. 4. Normal left and right heart filling pressures.  RECOMMENDATIONS: 1. Optimize medical therapy; will increase isosorbide mononitrate to 30 mg daily and continue other antianginal medications. 2. Decrease torsemide to 40 mg QAM and 20 mg QPM. 3. Obtain echo as an outpatient. 4. F/u in the office in 2 weeks with BMP at that time.  [name], MD [facility] Pager: [phone]

## 2018-09-02 ENCOUNTER — Other Ambulatory Visit: Payer: Self-pay | Admitting: Family Medicine

## 2018-09-02 ENCOUNTER — Encounter: Payer: Self-pay | Admitting: Internal Medicine

## 2018-09-02 ENCOUNTER — Telehealth: Payer: Self-pay | Admitting: *Deleted

## 2018-09-02 ENCOUNTER — Encounter: Payer: Self-pay | Admitting: *Deleted

## 2018-09-02 DIAGNOSIS — I480 Paroxysmal atrial fibrillation: Secondary | ICD-10-CM

## 2018-09-02 DIAGNOSIS — I5032 Chronic diastolic (congestive) heart failure: Secondary | ICD-10-CM

## 2018-09-02 NOTE — Telephone Encounter (Signed)
Lab order and echo order entered. Routing to scheduling to arrange follow up and echo so they may be able to be on the same day. Please let patient to arrive early enough to [place] to get lab work prior to coming down into the office for echo then appointment.   Please let me know if you have any questions.

## 2018-09-02 NOTE — Telephone Encounter (Signed)
-----   Message from [name], MD sent at 09/01/2018 12:59 PM EDT ----- Regarding: Post-cath f/u Hello,  Mr. Andringa underwent cath today and is being discharged home this afternoon.  Can you help set him up for a f/u with me or an APP in the office in ~2 weeks.  He will need a BMP and full echocardiogram (if possible) that day as well before he is seen by the provider.  Thanks.  Gerald Stabs

## 2018-09-03 ENCOUNTER — Ambulatory Visit: Payer: Medicare HMO | Admitting: Pharmacist

## 2018-09-03 ENCOUNTER — Other Ambulatory Visit: Payer: Self-pay | Admitting: Pharmacy Technician

## 2018-09-03 ENCOUNTER — Other Ambulatory Visit: Payer: Self-pay

## 2018-09-03 DIAGNOSIS — I5032 Chronic diastolic (congestive) heart failure: Secondary | ICD-10-CM

## 2018-09-03 DIAGNOSIS — IMO0002 Reserved for concepts with insufficient information to code with codable children: Secondary | ICD-10-CM

## 2018-09-03 DIAGNOSIS — I10 Essential (primary) hypertension: Secondary | ICD-10-CM

## 2018-09-03 DIAGNOSIS — E1121 Type 2 diabetes mellitus with diabetic nephropathy: Secondary | ICD-10-CM

## 2018-09-03 NOTE — Patient Outreach (Signed)
[facility]) Care Management  09/03/2018  Lucas Caldwell May 02, 1933 [id number]    Successful outreach call placed to patient in regards to [place] application for [employer].   Spoke to patient's wife, HIPAA identifiers verified.  [name] confirmed she picked up 4 boxes of [name]. Discussed refill procedure with her. She verbalized understanding. Confirmed patient had name and number for future reference in regards to patient assistance.  Informed patient's wife would be in touch about the receipt of [employer] medications. Patient should hopefully have them by month's end.  Will followup with patient in 10-14 business days.  Lucas Caldwell, [place] Management [phone]

## 2018-09-03 NOTE — Telephone Encounter (Signed)
Attempted to scheduled   - echo  - ov   Patient not at home and wife did not answer.  Will try again   Holding appt time for 7/2 at 930 for echo and 1130 for ov with Sharolyn Douglas

## 2018-09-07 ENCOUNTER — Other Ambulatory Visit: Payer: Self-pay

## 2018-09-07 ENCOUNTER — Telehealth: Payer: Self-pay | Admitting: Cardiovascular Disease

## 2018-09-07 ENCOUNTER — Telehealth: Payer: Self-pay | Admitting: *Deleted

## 2018-09-07 DIAGNOSIS — I5032 Chronic diastolic (congestive) heart failure: Secondary | ICD-10-CM

## 2018-09-07 DIAGNOSIS — N183 Chronic kidney disease, stage 3 unspecified: Secondary | ICD-10-CM

## 2018-09-07 MED ORDER — TORSEMIDE 20 MG PO TABS: ORAL_TABLET | ORAL | 0 refills | Status: DC

## 2018-09-07 NOTE — Telephone Encounter (Signed)
Patient is scheduled for 09/11/18 with echo after appointment. Notified scheduling to reschedule for echo to be before appointment. If it must be different day then that's ok.

## 2018-09-07 NOTE — Patient Instructions (Signed)
Thank you allowing the Chronic Care Management Team to be a part of your care! It was a pleasure speaking with you today!     CCM (Chronic Care Management) Team    Janci Minor RN, BSN Nurse Care Coordinator  [phone]   [name] PharmD  Clinical Pharmacist  [phone]   Eula Fried LCSW Clinical Social Worker [phone]  Visit Information  Goals Addressed            This Visit's Progress   . Medication Assistance       Current Barriers:  . Financial . Limited vision  Pharmacist Clinical Goal(s):  [name] Over the next 30 days, patient will work with CM Pharmacist to address needs related to medication assistance  Interventions:  Perform chart review  Heart catheterization completed 6/16, for evaluation of his CAD   Per procedure note from Dr. Saunders Revel, Mr. Jorge to:   Increase isosorbide mononitrate to 30 mg daily and continue other antianginal medications.  Decrease torsemide to 40 mg QAM and 20 mg QPM.  Obtain echo as an outpatient.  F/u in the office in 2 weeks with BMP at that time.  Discuss importance of medicaiton adherence ? Mrs. Godshall confirms that patient is taking isosorbide and torsemide as directed by Dr. Saunders Revel  Counsel on importance of checking blood sugar, blood pressure and weight daily and keeping log  Denies having blood sugar and blood pressure logs with her and patient at the moment  Reports morning blood sugars have been running from 140s to 190s  Weight this morning: 193 lbs  Medication Assistance ? Continue to collaborate with [facility] CPhT to assist patient with patient assistance applications for [name], [name] and Proventil ? Mrs. Milo confirms that she picked up Patient Assistance Program [place] from office.  ? Denies questions regarding [name] KwikPen device ? Reports patient still has Levemir remaining; will start [place] as directed once he is out of the Levemir ? Per [facility] CPhT - Dulera and Proventil have  been shipped by [employer] patient assistance program (mailed to patient's home)  Patient Self Care Activities:  . Check blood sugar, blood pressure and daily weight as directed by providers  o To have blood sugar and blood pressure log available for next phone appointment . Takes medications, with assistance of wife, as directed . Patient attends scheduled medical appointments  Please see past updates related to this goal by clicking on the "Past Updates" button in the selected goal         The patient verbalized understanding of instructions provided today and declined a print copy of patient instruction materials.   Telephone follow up appointment with care management team member scheduled for: 6/30   [name], PharmD, [place] [employer] [phone]

## 2018-09-07 NOTE — Telephone Encounter (Signed)
*  STAT* If patient is at the pharmacy, call can be transferred to refill team.   1. Which medications need to be refilled? (please list name of each medication and dose if known)  Torsemide  2. Which pharmacy/location (including street and city if local pharmacy) is medication to be sent to? [place]  3. Do they need a 30 day or 90 day supply? [place]

## 2018-09-07 NOTE — Telephone Encounter (Signed)
Please call regarding Torsemide directions.

## 2018-09-07 NOTE — Chronic Care Management (AMB) (Signed)
  Chronic Care Management   Follow Up Note   09/07/2018 Name: OMARIUS GRANTHAM MRN: [id number] DOB: Feb 21, 1934  Referred by: [name], DO Reason for referral : Chronic Care Management (Patient Phone Call)   EDWING FIGLEY is a 85 y.o. year old male who is a primary care patient of [name], DO. The CCM team was consulted for assistance with chronic disease management and care coordination needs.  Mr. Chait has a past medical history including but not limited to type 2 diabetes, CKD, hyperlipidemia, COPD, atrial fibrillation, hypertension, coronary artery disease, Chronic HFpEF, hypothyroidism, and CML  Outreach call to Mr. Abbs today regarding medication management and medication assistance.   Review of patient status, including review of consultants reports, relevant laboratory and other test results, and collaboration with appropriate care team members and the patient's provider was performed as part of comprehensive patient evaluation and provision of chronic care management services.    Goals Addressed            This Visit's Progress   . Medication Assistance       Current Barriers:  . Financial . Limited vision  Pharmacist Clinical Goal(s):  [name] Over the next 30 days, patient will work with CM Pharmacist to address needs related to medication assistance  Interventions:  Perform chart review  Heart catheterization completed 6/16, for evaluation of his CAD   Per procedure note from Dr. Saunders Revel, Mr. Maute to:   Increase isosorbide mononitrate to 30 mg daily and continue other antianginal medications.  Decrease torsemide to 40 mg QAM and 20 mg QPM.  Obtain echo as an outpatient.  F/u in the office in 2 weeks with BMP at that time.  Discuss importance of medicaiton adherence ? Mrs. Laskaris confirms that patient is taking isosorbide and torsemide as directed by Dr. Saunders Revel  Counsel on importance of checking blood sugar, blood pressure  and weight daily and keeping log  Denies having blood sugar and blood pressure logs with her and patient at the moment  Reports morning blood sugars have been running from 140s to 190s  Weight this morning: 193 lbs  Medication Assistance ? Continue to collaborate with [facility] CPhT to assist patient with patient assistance applications for [name], [name] and Proventil ? Mrs. Tones confirms that she picked up Patient Assistance Program [place] from office.  ? Denies questions regarding [name] KwikPen device ? Reports patient still has Levemir remaining; will start [place] as directed once he is out of the Levemir ? Per [facility] CPhT - Dulera and Proventil have been shipped by [employer] patient assistance program (mailed to patient's home)  Patient Self Care Activities:  . Check blood sugar, blood pressure and daily weight as directed by providers  o To have blood sugar and blood pressure log available for next phone appointment . Takes medications, with assistance of wife, as directed . Patient attends scheduled medical appointments  Please see past updates related to this goal by clicking on the "Past Updates" button in the selected goal         Plan  Telephone follow up appointment with care management team member scheduled for: 6/30 at 3 pm  [name], PharmD, [place] [phone]

## 2018-09-07 NOTE — Telephone Encounter (Signed)
Patient is scheduled. See appointment desk.

## 2018-09-07 NOTE — Telephone Encounter (Signed)
BMP order placed for in office- he has an appt on 6/26 at 3:00 pm with [name], NP and and echo at 4:00 pm.

## 2018-09-07 NOTE — Telephone Encounter (Signed)
Spoke with Autumn the pharmacist. She wanted to clarify Torsemide.  Advised the following from Dr Darnelle Bos last post cath note: RECOMMENDATIONS: 1. Optimize medical therapy; will increase isosorbide mononitrate to 30 mg daily and continue other antianginal medications. 2. Decrease torsemide to 40 mg QAM and 20 mg QPM.    Rx was sent in this morning as a refill.  Pharmacist will let patient know this is how medication should be taken at this time.

## 2018-09-07 NOTE — Telephone Encounter (Signed)
-----   Message from Blain Pais sent at 09/07/2018  8:25 AM EDT ----- Regarding: FW: Post-cath f/u Please place ordre for labwork at [facility] ----- Message ----- From: [name], MD Sent: 09/01/2018  12:59 PM EDT To: [facility]y [facility] Triage, [facility] Scheduling Subject: Post-cath f/u                                  Hello,  [name] underwent cath today and is being discharged home this afternoon.  Can you help set him up for a f/u with me or an APP in the office in ~2 weeks.  He will need a BMP and full echocardiogram (if possible) that day as well before he is seen by the provider.  Thanks.  Gerald Stabs

## 2018-09-10 ENCOUNTER — Telehealth: Payer: Self-pay | Admitting: Internal Medicine

## 2018-09-10 NOTE — Telephone Encounter (Signed)

## 2018-09-11 ENCOUNTER — Ambulatory Visit: Payer: Medicare HMO | Admitting: Nurse Practitioner

## 2018-09-11 ENCOUNTER — Other Ambulatory Visit (INDEPENDENT_AMBULATORY_CARE_PROVIDER_SITE_OTHER): Payer: Medicare HMO

## 2018-09-11 ENCOUNTER — Ambulatory Visit (INDEPENDENT_AMBULATORY_CARE_PROVIDER_SITE_OTHER): Payer: Medicare HMO

## 2018-09-11 ENCOUNTER — Other Ambulatory Visit: Payer: Self-pay

## 2018-09-11 DIAGNOSIS — I5032 Chronic diastolic (congestive) heart failure: Secondary | ICD-10-CM | POA: Diagnosis not present

## 2018-09-11 DIAGNOSIS — I480 Paroxysmal atrial fibrillation: Secondary | ICD-10-CM

## 2018-09-11 DIAGNOSIS — N183 Chronic kidney disease, stage 3 unspecified: Secondary | ICD-10-CM

## 2018-09-11 NOTE — Progress Notes (Unsigned)
   Patient ID: Lucas Caldwell, male    DOB: 02/04/1934, 85 y.o.   MRN: [id number]  Given the LVEF on todays echocardiogram, A Secure Chat message was sent to Dr End and the preliminary result was discussed with [name], NP. Patient was released shortly after     Review of Systems    Physical Exam

## 2018-09-12 LAB — BASIC METABOLIC PANEL
BUN/Creatinine Ratio: 14 (ref 10–24)
BUN: 24 mg/dL (ref 8–27)
CO2: 22 mmol/L (ref 20–29)
Calcium: 9.3 mg/dL (ref 8.6–10.2)
Chloride: 97 mmol/L (ref 96–106)
Creatinine, Ser: 1.74 mg/dL — ABNORMAL HIGH (ref 0.76–1.27)
GFR calc Af Amer: 41 mL/min/{1.73_m2} — ABNORMAL LOW (ref >59)
GFR calc non Af Amer: 36 mL/min/{1.73_m2} — ABNORMAL LOW (ref >59)
Glucose: 184 mg/dL — ABNORMAL HIGH (ref 65–99)
Potassium: 4.3 mmol/L (ref 3.5–5.2)
Sodium: 132 mmol/L — ABNORMAL LOW (ref 134–144)

## 2018-09-14 ENCOUNTER — Telehealth: Payer: Self-pay | Admitting: *Deleted

## 2018-09-14 ENCOUNTER — Other Ambulatory Visit: Payer: Self-pay | Admitting: Pharmacy Technician

## 2018-09-14 DIAGNOSIS — N183 Chronic kidney disease, stage 3 unspecified: Secondary | ICD-10-CM

## 2018-09-14 DIAGNOSIS — I5032 Chronic diastolic (congestive) heart failure: Secondary | ICD-10-CM

## 2018-09-14 DIAGNOSIS — Z79899 Other long term (current) drug therapy: Secondary | ICD-10-CM

## 2018-09-14 MED ORDER — TORSEMIDE 20 MG PO TABS: 20.0000 mg | ORAL_TABLET | Freq: Two times a day (BID) | ORAL | 0 refills | Status: DC

## 2018-09-14 NOTE — Patient Outreach (Signed)
[facility] [facility]) Care Management  09/14/2018  Lucas Caldwell 27-Oct-1933 [id number]   Addendum  Successful outreach call placed to patient in regards to [employer] application for [name].  Spoke to patient who informed he was currently driving and this was not a good time.  Informed patient I would call him back.  Will followup with patient in 3-7 business days to update him on the delivery status of his medication.  Lucas Caldwell P. Annaleigha Woo, [place] Management [phone]

## 2018-09-14 NOTE — Telephone Encounter (Signed)
-----   Message from [name], MD sent at 09/12/2018  6:39 PM EDT ----- Please let Mr. Harty know that his kidney function has worsened slightly from baseline.  I recommend holding torsemide for 1 day, then cutting back to 20 mg BID.  Echo shows moderately reduced LVEF.  We should repeat a BMP in ~2 weeks.

## 2018-09-14 NOTE — Telephone Encounter (Signed)
Spoke with patient's wife, ok per DPR. She verbalized understanding of lab and echo results. Patient has already taken morning dose of torsemide today but will hold tomorrow and resume at 20 mg BID. Patient also has appointment on Wednesday. She is aware of process and [facility] for repeat lab work.

## 2018-09-14 NOTE — Patient Outreach (Signed)
[facility]) Care Management  09/14/2018  Lucas Caldwell 1933/09/01 [id number]  Care coordination call placed to [employer] in regards to [facility] and Proventil application.  Spoke to [facility] who informed medication was sent out on 09/01/2018 with an expected delivery date of 09/08/2018. She informed she would have to transfer me to [place] for more information.  Spoke to [place] at [employer] who informed the postal service was running behind 1-3 days of their expected delivery time frames of 7-10 business days. She informed it was taking more between 10-14 business days for patient's to receive their medication. She was unable to pull up the tracking at the time of the call due to the [place] website being down. She provided a tracking number of [id number]. She informed the delivery should occur this week.  Will followup with patient with this information.  Lucas Caldwell P. Laelia Angelo, [place] Management [phone]

## 2018-09-15 ENCOUNTER — Ambulatory Visit: Payer: Self-pay | Admitting: Pharmacist

## 2018-09-15 ENCOUNTER — Telehealth: Payer: Medicare HMO

## 2018-09-15 ENCOUNTER — Ambulatory Visit: Payer: Medicare HMO | Admitting: Family Medicine

## 2018-09-15 DIAGNOSIS — I10 Essential (primary) hypertension: Secondary | ICD-10-CM

## 2018-09-16 ENCOUNTER — Encounter: Payer: Self-pay | Admitting: Nurse Practitioner

## 2018-09-16 ENCOUNTER — Other Ambulatory Visit: Payer: Self-pay

## 2018-09-16 ENCOUNTER — Ambulatory Visit (INDEPENDENT_AMBULATORY_CARE_PROVIDER_SITE_OTHER): Payer: Medicare HMO | Admitting: Nurse Practitioner

## 2018-09-16 ENCOUNTER — Ambulatory Visit (INDEPENDENT_AMBULATORY_CARE_PROVIDER_SITE_OTHER): Payer: Medicare HMO

## 2018-09-16 VITALS — BP 126/76 | HR 87 | Ht 64.0 in | Wt 193.6 lb

## 2018-09-16 DIAGNOSIS — I25119 Atherosclerotic heart disease of native coronary artery with unspecified angina pectoris: Secondary | ICD-10-CM | POA: Diagnosis not present

## 2018-09-16 DIAGNOSIS — I480 Paroxysmal atrial fibrillation: Secondary | ICD-10-CM

## 2018-09-16 DIAGNOSIS — E1142 Type 2 diabetes mellitus with diabetic polyneuropathy: Secondary | ICD-10-CM | POA: Diagnosis not present

## 2018-09-16 DIAGNOSIS — N183 Chronic kidney disease, stage 3 unspecified: Secondary | ICD-10-CM

## 2018-09-16 DIAGNOSIS — I255 Ischemic cardiomyopathy: Secondary | ICD-10-CM

## 2018-09-16 DIAGNOSIS — I10 Essential (primary) hypertension: Secondary | ICD-10-CM | POA: Diagnosis not present

## 2018-09-16 DIAGNOSIS — I5042 Chronic combined systolic (congestive) and diastolic (congestive) heart failure: Secondary | ICD-10-CM

## 2018-09-16 DIAGNOSIS — Z7901 Long term (current) use of anticoagulants: Secondary | ICD-10-CM | POA: Diagnosis not present

## 2018-09-16 DIAGNOSIS — E785 Hyperlipidemia, unspecified: Secondary | ICD-10-CM | POA: Diagnosis not present

## 2018-09-16 DIAGNOSIS — L851 Acquired keratosis [keratoderma] palmaris et plantaris: Secondary | ICD-10-CM | POA: Diagnosis not present

## 2018-09-16 DIAGNOSIS — B351 Tinea unguium: Secondary | ICD-10-CM | POA: Diagnosis not present

## 2018-09-16 LAB — POCT INR: INR: 2 (ref 2.0–3.0)

## 2018-09-16 MED ORDER — ISOSORBIDE MONONITRATE ER 60 MG PO TB24: 60.0000 mg | ORAL_TABLET | Freq: Every day | ORAL | 3 refills | Status: DC

## 2018-09-16 NOTE — Patient Instructions (Signed)
Please continue dosage of 1 TABLET EVERY DAY EXCEPT 1.5 TABLETS ON MONDAYS & FRIDAYS. Recheck INR in 6 weeks.

## 2018-09-16 NOTE — Chronic Care Management (AMB) (Signed)
  Chronic Care Management   Follow Up Note   09/16/2018 Name: Lucas Caldwell MRN: [id number] DOB: [birth year]  Referred by: [name], DO Reason for referral : Chronic Care Management (Patient Phone Call)   Lucas Caldwell is a 85 y.o. year old male who is a primary care patient of [name], DO. The CCM team was consulted for assistance with chronic disease management and care coordination needs.  Lucas Caldwell has a past medical history including but not limited to type 2 diabetes, CKD, hyperlipidemia, COPD, atrial fibrillation, hypertension, coronary artery disease, Chronic HFpEF, hypothyroidism, and CML  Outreach call today to Lucas Caldwell and Lucas Caldwell today as scheduled to follow up regarding medication management and diabetes management. Lucas Caldwell reports that he is doing well. Reports that Lucas Caldwell is not currently home. Reschedule appointment.  Review of patient status, including review of consultants reports, relevant laboratory and other test results, and collaboration with appropriate care team members and the patient's provider was performed as part of comprehensive patient evaluation and provision of chronic care management services.    Goals Addressed            This Visit's Progress   . Medication Assistance       Current Barriers:  . Financial . Limited vision  Pharmacist Clinical Goal(s):  [name] Over the next 30 days, patient will work with CM Pharmacist to address needs related to medication assistance  Interventions:  Perform chart review  Labwork from 6/26 showed increase in Scr. To repeat BMP in ~2 weeks.   Dr. Saunders Revel instructed patient to hold torsemide for 1 day, then cut back to 20 mg BID.    Echo showed moderately reduced LVEF.   Continue to collaborate with [facility] CPhT to assist patient with patient assistance applications for [facility] and Proventil ? Per [facility] CPhT - Dulera and Proventil have been shipped by [employer] patient  assistance program (mailed to patient's home)  Patient Self Care Activities:  . Check blood sugar, blood pressure and daily weight as directed by providers  o To have blood sugar and blood pressure log available for next phone appointment . Takes medications, with assistance of wife, as directed . Patient attends scheduled medical appointments  Please see past updates related to this goal by clicking on the "Past Updates" button in the selected goal         Plan  Telephone follow up appointment with care management team member rescheduled for: 7/7 at 3:30 pm  [name], PharmD, [place] [phone]

## 2018-09-16 NOTE — Patient Instructions (Addendum)
Thank you allowing the Chronic Care Management Team to be a part of your care! It was a pleasure speaking with you today!     CCM (Chronic Care Management) Team    Janci Minor RN, BSN Nurse Care Coordinator  [phone]   [name] PharmD  Clinical Pharmacist  [phone]   Eula Fried LCSW Clinical Social Worker [phone]  Visit Information  Goals Addressed            This Visit's Progress   . Medication Assistance       Current Barriers:  . Financial . Limited vision  Pharmacist Clinical Goal(s):  [name] Over the next 30 days, patient will work with CM Pharmacist to address needs related to medication assistance  Interventions:  Perform chart review  Labwork from 6/26 showed increase in Scr. To repeat BMP in ~2 weeks.   Dr. Saunders Revel instructed patient to hold torsemide for 1 day, then cut back to 20 mg BID.    Echo showed moderately reduced LVEF.   Continue to collaborate with [facility] CPhT to assist patient with patient assistance applications for [facility] and Proventil ? Per [facility] CPhT - Dulera and Proventil have been shipped by [employer] patient assistance program (mailed to patient's home)  Patient Self Care Activities:  . Check blood sugar, blood pressure and daily weight as directed by providers  o To have blood sugar and blood pressure log available for next phone appointment . Takes medications, with assistance of wife, as directed . Patient attends scheduled medical appointments  Please see past updates related to this goal by clicking on the "Past Updates" button in the selected goal         The patient verbalized understanding of instructions provided today and declined a print copy of patient instruction materials.   Telephone follow up appointment with care management team member scheduled for: 7/7 at 3:30 pm  [name], PharmD, [place] [phone]

## 2018-09-16 NOTE — Progress Notes (Signed)
Office Visit    Patient Name: Lucas Caldwell Date of Encounter: 09/16/2018  Primary Care Provider:  [name], DO Primary Cardiologist:  [name], MD  Chief Complaint    85 year old male with a history of CAD status post prior RCA and circumflex stenting, ischemic cardiomyopathy, chronic combined heart failure, hypertension, hyperlipidemia, type 2 diabetes mellitus, paroxysmal atrial fibrillation, chronic kidney disease stage III-IV, CML, iron deficiency anemia, GI bleed due to duodenal ulcers in October 2019, BPH, and hypothyroidism, who presents for follow-up after recent diagnostic catheterization.  Past Medical History    Past Medical History:  Diagnosis Date  . Arthritis    "probably in his legs" (08/15/2016)  . Blind right eye   . BPH (benign prostatic hyperplasia)   . Breast asymmetry    Left breast is larger, present for several years.  [name] CAD (coronary artery disease)    a. Cath in the late 90's - reportedly ok;  b. 2014 s/p stenting x 2 @ [facility]; c 08/19/16 Cath/PCI with DES -> RCA, plan to treat LM 70% medically. Seen by surgery and felt to be too high risk for CABG; d. 08/2018 Cath: LM 70 (iFR 0.86), LAD 20ost, 40p, 50/40m, D1 70, LCX patent stent, OM1 30, RCA patent stent, 55m ISR, 40d, RPDA 30. PCWP 8. CO/CI 4.0/2.1.  [name] Chronic combined systolic (congestive) and diastolic (congestive) heart failure ([place])    a. Previously reduced EF-->50% by echo in 2012;  b. 06/2015 Echo: EF 50-55%  c. 07/2016 Echo: EF 45-50%; d. 12/2017 Echo: EF 55%, Gr1 DD, mild MR; e. 08/2018 Echo: EF 35-40%, mildly dil PA.  [name] Chronic kidney disease (CKD), stage IV (severe) ([place])   . CML (chronic myelocytic leukemia) ([place])   . GERD (gastroesophageal reflux disease)   . GIB (gastrointestinal bleeding)    a. 12/2017 3 unit PRBC GIB in setting of coumadin-->Endo/colon multiple duodenal ulcers and a single bleeding ulcer in the proximal ascending colon status post hemostatic clipping x2.   . Gout   . Hyperlipemia   . Hypertension   . Hypothyroidism   . Iron deficiency anemia   . Ischemic cardiomyopathy    a. Previously reduced EF-->50% by echo in 2012;  b. 06/2015 Echo: EF 50-55%, Gr2 DD, mild MR, mildly dil LA, Ao sclerosis, mild TR;  c. 07/2016 Echo: EF 45-50%; d. 12/2017 Echo: EF 55%, Gr1 DD, mild MR; e. 08/2018 Echo: EF 35-40%.  . Migraine    "in the 1960s" (08/15/2016)  . PAF (paroxysmal atrial fibrillation) (H[facility])    a. ? Dx 2014-->s/p DCCV;  b. CHA2DS2VASc = 6--> Coumadin.  . Prostate cancer ([place])   . RBBB   . Type II diabetes mellitus ([place])    Past Surgical History:  Procedure Laterality Date  . CATARACT EXTRACTION W/ INTRAOCULAR LENS  IMPLANT, BILATERAL Bilateral   . COLONOSCOPY N/A 01/13/2018   Procedure: COLONOSCOPY;  Surgeon: Lin Landsman, MD;  Location: [facility] ENDOSCOPY;  Service: Gastroenterology;  Laterality: N/A;  . COLONOSCOPY WITH PROPOFOL N/A 01/01/2018   Procedure: COLONOSCOPY WITH PROPOFOL;  Surgeon: Lin Landsman, MD;  Location[facility] END[facility]COPY;  Service: Gastroenterology;  Laterality: N/A;  . CORONARY ANGIOPLASTY WITH STENT PLACEMENT  09/2012   2 stents  . CORONARY STENT INTERVENTION N/A 08/19/2016   Procedure: Coronary Stent Intervention;  Surgeon: [name], MD;  Location: [place] CV LAB;  Service: Cardiovascular;  Laterality: N/A;  . ESOPHAGEAL MANOMETRY N/A 12/16/2017   Procedure: ESOPHAGEAL MANOMETRY (EM);  Surgeon: Marius Ditch,  Tally Due, MD;  Location: [place];  Service: Gastroenterology;  Laterality: N/A;  . ESOPHAGOGASTRODUODENOSCOPY N/A 12/20/2016   Procedure: ESOPHAGOGASTRODUODENOSCOPY (EGD);  Surgeon: Lin Landsman, MD;  Location: [facility] ENDOSCOPY;  Service: Gastroenterology;  Laterality: N/A;  . ESOPHAGOGASTRODUODENOSCOPY N/A 01/13/2018   Procedure: ESOPHAGOGASTRODUODENOSCOPY (EGD);  Surgeon: Lin Landsman, MD;  Location: [facility] ENDOSCOPY;  Service: Gastroenterology;  Laterality: N/A;  .  ESOPHAGOGASTRODUODENOSCOPY (EGD) WITH PROPOFOL N/A 11/03/2017   Procedure: ESOPHAGOGASTRODUODENOSCOPY (EGD) WITH PROPOFOL;  Surgeon: Lin Landsman, MD;  Location: [facility] ENDOSCOPY;  Service: Gastroenterology;  Laterality: N/A;  . EYE SURGERY    . HERNIA REPAIR     "navel"  . LEFT HEART CATH AND CORONARY ANGIOGRAPHY N/A 08/14/2016   Procedure: Left Heart Cath and Coronary Angiography;  Surgeon: [name], MD;  Location: [place] CV LAB;  Service: Cardiovascular;  Laterality: N/A;  . RIGHT/LEFT HEART CATH AND CORONARY ANGIOGRAPHY N/A 09/01/2018   Procedure: RIGHT/LEFT HEART CATH AND CORONARY ANGIOGRAPHY;  Surgeon: [name], MD;  Location: [place] CV LAB;  Service: Cardiovascular;  Laterality: N/A;  . TOE AMPUTATION Right    "big toe"    Allergies  Allergies  Allergen Reactions  . Clopidogrel Anaphylaxis    altered mental status;  (electrolytes "out of wack" and confusion per family; THEY DO NOT REMEMBER ANY OTHER REACTION)- MSB 10/10/15  . Ciprofloxacin Itching  . Mirtazapine Diarrhea    Bad dreams   . Spironolactone     gynecomastia    History of Present Illness    85 year old male with above complex past medical history including CAD, mixed ischemic and nonischemic cardiomyopathy, HFrEF, hypertension, hyperlipidemia, type 2 diabetes mellitus, paroxysmal atrial fibrillation, chronic kidney disease stage III-IV, CML, iron deficiency anemia, GI bleed, BPH, and hypothyroidism.  He previously underwent stenting of the right coronary artery in June 2018 and is known to have residual 70% left main disease.  He was evaluated by CT surgery at that time and was not felt to be a suitable surgical candidate.  In that setting, he has been medically managed.  He does have a history of LV dysfunction with an EF of 45 to 50% by echo in May 2018 the more recent echo in October 2019 showed an EF of 55% with grade 1 diastolic dysfunction.  It was in October 2019 that he was  admitted with GI bleeding in the setting of chronic Coumadin and was found to have multiple duodenal ulcer as well as oozing from a recently treated colonic angiectasia.  After outpatient follow-up with GI, Coumadin was resumed in November 2019.  Throughout this year, he has been having dyspnea on exertion.  This persisted despite diuretic adjustment and at follow-up visit in June, decision was made to pursue diagnostic catheterization.  This showed a 70% left main stenosis with and abnl iFR of 0.86, though drop in iFR was due to diffuse prox LAD dzs, and there was only a small gradient involving the ostial LM.  In that setting, his Isosorbide was increased to 30 mg daily.  In the setting of normal filling pressures, torsemide dose was reduced to 40 mg in the morning and 20 mg in the p.m.   F/u Creat on 6/26, showed a rise in creat to 1.74 and he was advised to hold torsemide on 6/27 and then resume @ 20mg  BID.  F/u echo showed sl reduction in LV fxn, now 35-40%.  Mr. Aymond continues to have dyspnea w/ minimal exertion.  He denies chest pain and his  wt has been stable.  He denies palpitations, pnd, orthopnea, n, v, dizziness, syncope, edema, or early satiety.  He hasn't really noticed a difference in symptoms since the imdur was increased.  Home Medications    Prior to Admission medications   Medication Sig Start Date End Date Taking? Authorizing Provider  acetaminophen (TYLENOL) 500 MG tablet Take 1,000 mg by mouth every 6 (six) hours as needed for moderate pain or headache.     [provider]  Alcohol Swabs 70 % PADS Use when checking blood sugar up to 3x daily 04/09/17   Parks Ranger, [name], DO  allopurinol (ZYLOPRIM) 100 MG tablet Take 1 tablet (100 mg total) by mouth daily. 03/31/18   Karamalegos, [name], DO  aspirin EC 81 MG tablet Take 1 tablet (81 mg total) by mouth daily. 08/28/18   End, [name], MD  Blood Glucose Monitoring Suppl (TRUE METRIX AIR GLUCOSE METER) DEVI 1  Device by Does not apply route 2 (two) times daily. 06/08/18   Karamalegos, [name], DO  bosutinib (BOSULIF) 100 MG tablet Take 100 mg by mouth at bedtime as needed (tired/not feeling well). Take with food.     [provider]  BREO ELLIPTA 100-25 MCG/INH AEPB Inhale 1 puff into the lungs daily.  08/21/18   [provider]  Dulaglutide (TRULICITY) 2.35 TD/3.2KG SOPN Inject 0.75 mg into the skin once a week. Patient taking differently: Inject 0.75 mg into the skin every Saturday.  10/31/17   Karamalegos, [name], DO  enalapril (VASOTEC) 5 MG tablet TAKE ONE TABLET BY MOUTH EVERY DAY 09/02/18   Karamalegos, [name], DO  gabapentin (NEURONTIN) 300 MG capsule Take 2 capsules (600 mg total) by mouth 2 (two) times daily. Max dose = 1200mg  daily for kidney function 07/12/18   Parks Ranger, [name], DO  glucose blood (TRUE METRIX BLOOD GLUCOSE TEST) test strip Use as instructed 06/08/18   Parks Ranger, [name], DO  insulin detemir (LEVEMIR) 100 UNIT/ML injection Inject 60 Units into the skin daily.    [provider]  Insulin Pen Needle 31G X 5 MM MISC 31 g by Does not apply route as directed. 04/16/17   [name], NP  isosorbide mononitrate (IMDUR) 30 MG 24 hr tablet Take 1 tablet (30 mg total) by mouth daily. 09/01/18 11/30/18  End, [name], MD  levothyroxine (SYNTHROID, LEVOTHROID) 25 MCG tablet Take 1 tablet (25 mcg total) by mouth daily before breakfast. 03/31/18   Karamalegos, [name], DO  loperamide (IMODIUM A-D) 2 MG tablet Take 2 mg by mouth 3 (three) times daily as needed for diarrhea or loose stools.     [provider]  Menthol-Zinc Oxide (GOLD BOND EX) Apply 1 application topically daily as needed (muscle pain).    [provider]  metoprolol succinate (TOPROL XL) 25 MG 24 hr tablet Take 1 tablet (25 mg total) by mouth daily. 03/06/18   End, [name], MD  Multiple Vitamins-Minerals (MULTIVITAMIN ADULTS 50+ PO) Take 1  tablet by mouth every morning.    [provider]  nitroGLYCERIN (NITROSTAT) 0.4 MG SL tablet Place 1 tablet (0.4 mg total) under the tongue every 5 (five) minutes x 3 doses as needed for chest pain. 04/22/18   Theora Gianotti, NP  omeprazole (PRILOSEC) 40 MG capsule TAKE ONE CAPSULE BY MOUTH TWICE DAILY Patient taking differently: Take 40 mg by mouth daily.  05/14/18   Lin Landsman, MD  Polyethyl Glycol-Propyl Glycol (SYSTANE OP) Place 1 drop into both eyes  daily as needed (dry eyes).    [provider]  PROVENTIL HFA 108 (90 Base) MCG/ACT inhaler Inhale 2 puffs into the lungs every 6 (six) hours as needed for wheezing or shortness of breath. 07/16/18   Karamalegos, [name], DO  rosuvastatin (CRESTOR) 5 MG tablet Take 1 tablet (5 mg total) by mouth daily. Patient taking differently: Take 5 mg by mouth at bedtime.  07/14/18 10/12/18  End, [name], MD  torsemide (DEMADEX) 20 MG tablet Take 1 tablet (20 mg total) by mouth 2 (two) times daily. 09/14/18   End, [name], MD  warfarin (COUMADIN) 3 MG tablet Take 1 tablet (3 mg total) by mouth daily. Take as directed by the coumadin clinic. Patient taking differently: Take 3-4.5 mg by mouth See admin instructions. Take 3 mg at night on Sun, Tue, Wed, Thur, and Sat.  Take 4.5 mg at night on Mon and Fri. 04/17/18   End, [name], MD    Review of Systems    Ongoing DOE.  He denies chest pain, palpitations, pnd, orthopnea, n, v, dizziness, syncope, edema, weight gain, or early satiety.  All other systems reviewed and are otherwise negative except as noted above.  Physical Exam    VS:  BP 126/76 (BP Location: Left Arm, Patient Position: Sitting, Cuff Size: Normal)   Pulse 87   Ht 5\' 4"  (1.626 m)   Wt 193 lb 9.6 oz (87.8 kg)   BMI 33.23 kg/m  , BMI Body mass index is 33.23 kg/m. GEN: Well nourished, well developed, in no acute distress. HEENT: normal. Neck: Supple, no JVD, carotid bruits, or masses. Cardiac:  RRR, no murmurs, rubs, or gallops. No clubbing, cyanosis, edema.  Radials/PT 2+ and equal bilaterally.  Respiratory:  Respirations regular and unlabored, clear to auscultation bilaterally. GI: Soft, nontender, nondistended, BS + x 4. MS: no deformity or atrophy. Skin: warm and dry, no rash. Neuro:  Strength and sensation are intact. Psych: Normal affect.  Accessory Clinical Findings    ECG personally reviewed by me today - RSR, 87, PVC's, LAD, RBBB - no acute changes.  Lab Results  Component Value Date   CREATININE 1.74 (H) 09/11/2018   BUN 24 09/11/2018   NA 132 (L) 09/11/2018   K 4.3 09/11/2018   CL 97 09/11/2018   CO2 22 09/11/2018   Lab Results  Component Value Date   ALT 14 08/28/2018   AST 23 08/28/2018   ALKPHOS 82 08/28/2018   BILITOT 0.5 08/28/2018   Lab Results  Component Value Date   WBC 7.9 08/28/2018   HGB 12.6 (L) 08/28/2018   HCT 40.7 08/28/2018   MCV 85.3 08/28/2018   PLT 199 08/28/2018   Lab Results  Component Value Date   CHOL 110 08/28/2018   HDL 28 (L) 08/28/2018   LDLCALC 51 08/28/2018   TRIG 155 (H) 08/28/2018   CHOLHDL 3.9 08/28/2018    Assessment & Plan    1.  CAD:  S/p recent cath showing stable anatomy w/ patent RCA and LCX stents and ongoing 70% ostial LM disease.  iFR was abnl @ 0.86, however this was felt to be 2/2 diffuse LAD disease and cont med rx was rec.  His imdur was increased to 30mg  daily, though he hasn't noted much difference in his symptoms and he cont to have significant DOE w/ minimal activity.  No chest pain.  Volume looks good today.  I am going to increase his imdur further, to 60mg  daily.  He otw remains on asa,  acei,  blocker, and statin rx.  If dyspnea does not improve with additional antianginal therapy, will refer to pulmonology in the future.  2.  Chronic combined systolic/diastolic CHF/ICM:  EF recently 35-40%.  Euvolemic on exam.  He remains on  blocker, acei, and torsemide, w/ torsemide dose reduced to 20mg  bid  2/2 rising creat.  [name] due for f/u bmet next week.  With drop in EF, can consider switching lisinopril to entresto in the future if financially feasible.  He was previously intolerant to spironolactone due to gynecomastia, but we could consider eplerenone.  3.  PAF:  Maintaining sinus rhythm on  blocker.  [place] w/ coumadin.  4.  Essential HTN:  Stable.  5.  HL:  Cont rosuvastatin.  LDL 51 in June.  LFTs wnl @ that time.   6.  CKD III:  Creat elevated last week in setting of higher dose of torsemide.  F/u again next week.  7.  Anemia:  Has been stable.  8.  DM II:  On insulin. A1c 8.3 in June.  Followed closely by primary care.   9.  Disposition: Follow-up basic metabolic panel next week.  Follow-up in clinic in approximately 2 weeks.  [name], NP 09/16/2018, 5:00 PM

## 2018-09-16 NOTE — Patient Instructions (Signed)
Medication Instructions:  Your physician has recommended you make the following change in your medication:   INCREASE Imdur to 60mg  daily. An Rx has been sent to your pharmacy.  If you need a refill on your cardiac medications before your next appointment, please call your pharmacy.   Lab work: Your physician recommends that you return for lab work on 09/28/18 (bmet)  Please have your lab drawn at [facility]  If you have labs (blood work) drawn today and your tests are completely normal, you will receive your results only by: [name] MyChart Message (if you have MyChart) OR . A paper copy in the mail If you have any lab test that is abnormal or we need to change your treatment, we will call you to review the results.  Testing/Procedures: None ordered  Follow-Up: At [facility], you and your health needs are our priority.  As part of our continuing mission to provide you with exceptional heart care, we have created designated Provider Care Teams.  These Care Teams include your primary Cardiologist (physician) and Advanced Practice Providers (APPs -  Physician Assistants and Nurse Practitioners) who all work together to provide you with the care you need, when you need it. You will need a follow up appointment in 2-3 weeks. You may see [name], MD or one of the following Advanced Practice Providers on your designated Care Team:   [name], NP

## 2018-09-22 ENCOUNTER — Ambulatory Visit: Payer: Self-pay | Admitting: Pharmacist

## 2018-09-22 ENCOUNTER — Telehealth: Payer: Self-pay

## 2018-09-22 DIAGNOSIS — IMO0002 Reserved for concepts with insufficient information to code with codable children: Secondary | ICD-10-CM

## 2018-09-22 DIAGNOSIS — E1121 Type 2 diabetes mellitus with diabetic nephropathy: Secondary | ICD-10-CM

## 2018-09-22 NOTE — Chronic Care Management (AMB) (Signed)
  Chronic Care Management   Follow Up Note   09/22/2018 Name: Lucas Caldwell MRN: [id number] DOB: 26-Oct-1933  Referred by: [name], DO Reason for referral : Chronic Care Management (Patient Phone Call)   Lucas Caldwell is a 85 y.o. year old male who is a primary care patient of [name], DO. The CCM team was consulted for assistance with chronic disease management and care coordination needs.  [name] has a past medical history including but not limited to type 2 diabetes, CKD, hyperlipidemia, COPD, atrial fibrillation, hypertension, coronary artery disease, Chronic HFpEF, hypothyroidism, and CML  I reached out to [name] by phone today as scheduled to follow up regarding medication management and diabetes management. Was unable to reach patient via telephone today and unable to leave a message as no voicemail picks up.    The care management team will reach out to the patient again over the next 14 days.   [name], PharmD, [place] [employer] [phone]

## 2018-09-23 ENCOUNTER — Other Ambulatory Visit: Payer: Self-pay | Admitting: Pharmacy Technician

## 2018-09-23 NOTE — Patient Outreach (Signed)
[facility]) Care Management  09/23/2018  SACHA RADLOFF 17-Dec-1933 [id number]   Successful outreach call placed to patient in regards to [employer] patient assistance for [place] patient assistance for [employer].  Spoke to patient's wife, HIPAA identifiers verified.  [name] confirmed that patient received 1 inhaler each of Dulera and Proventil. Informed her that when patient is down to an approximately 2 week supply of either inhaler, then they would need to contact the company for a refill. Informed her that the phone number was located on the label on the inhaler box. [name] was able to find the number. And verbalized understanding.  Also, went over the refill procedure again for the patient's [employer] patient assistance products. Patient is set up for automatic refills but reminded patient's wife  if she has not heard from them and patient is down to a 2 week supply, then she would need to reach out to the company.  [name] verbalized understanding of that process as well.  [name] informed she had no other questions or concerns about the medications or patient assistance at this time. Confirmed patient had name and number.  Will route note to [place] that patient assistance has been completed and will remove myself from care team.  Luiz Ochoa. Darshawn Boateng, [place] [facility] [phone]

## 2018-09-28 ENCOUNTER — Other Ambulatory Visit: Payer: Self-pay | Admitting: Gastroenterology

## 2018-09-28 ENCOUNTER — Other Ambulatory Visit: Payer: Self-pay | Admitting: Family Medicine

## 2018-09-28 DIAGNOSIS — K219 Gastro-esophageal reflux disease without esophagitis: Secondary | ICD-10-CM

## 2018-09-28 DIAGNOSIS — M1A39X0 Chronic gout due to renal impairment, multiple sites, without tophus (tophi): Secondary | ICD-10-CM

## 2018-09-28 DIAGNOSIS — E039 Hypothyroidism, unspecified: Secondary | ICD-10-CM

## 2018-10-02 ENCOUNTER — Telehealth: Payer: Self-pay | Admitting: Internal Medicine

## 2018-10-02 ENCOUNTER — Ambulatory Visit (INDEPENDENT_AMBULATORY_CARE_PROVIDER_SITE_OTHER): Payer: Medicare HMO | Admitting: Pharmacist

## 2018-10-02 DIAGNOSIS — I5032 Chronic diastolic (congestive) heart failure: Secondary | ICD-10-CM

## 2018-10-02 DIAGNOSIS — J449 Chronic obstructive pulmonary disease, unspecified: Secondary | ICD-10-CM | POA: Diagnosis not present

## 2018-10-02 DIAGNOSIS — E1165 Type 2 diabetes mellitus with hyperglycemia: Secondary | ICD-10-CM | POA: Diagnosis not present

## 2018-10-02 DIAGNOSIS — IMO0002 Reserved for concepts with insufficient information to code with codable children: Secondary | ICD-10-CM

## 2018-10-02 DIAGNOSIS — E1121 Type 2 diabetes mellitus with diabetic nephropathy: Secondary | ICD-10-CM | POA: Diagnosis not present

## 2018-10-02 NOTE — Patient Instructions (Signed)
Thank you allowing the Chronic Care Management Team to be a part of your care! It was a pleasure speaking with you today!     CCM (Chronic Care Management) Team    Janci Minor RN, BSN Nurse Care Coordinator  [phone]   [name] PharmD  Clinical Pharmacist  [phone]   Eula Fried LCSW Clinical Social Worker [phone]  Visit Information  Goals Addressed            This Visit's Progress   . COMPLETED: Medication Assistance       Current Barriers:  . Financial . Limited vision  Pharmacist Clinical Goal(s):  [name] Over the next 30 days, patient will work with CM Pharmacist to address needs related to medication assistance  Interventions:  Mrs. Carchi confirms patient has received [name] and Proventil from the [employer] patient assistance program  Reports that Mr. Panico has discontinued Memory Dance and is now taking Dulera and Proventil as needed.   Counsel on difference between maintenance and rescue inhalers. Counsel patient to take [facility] - 2 puffs twice daily and rinse mouth out after use as directed.  Patient Self Care Activities:  . Check blood sugar, blood pressure and daily weight as directed by providers  o To have blood sugar and blood pressure log available for next phone appointment . Takes medications, with assistance of wife, as directed . Patient attends scheduled medical appointments  Please see past updates related to this goal by clicking on the "Past Updates" button in the selected goal      . Medication Managment       Current Barriers:  . Financial . Limited vision . Lack of blood sugar and blood pressure results for clinical team  Pharmacist Clinical Goal(s):  [name] Over the next 30 days, patient will work with CM Pharmacist to address needs related to medication adherence and diabetes management  Interventions: . Perform chart review o Labwork from 6/26 showed increase in Scr. To repeat BMP in ~2 weeks.  o Dr. Saunders Revel instructed  patient to hold torsemide for 1 day, then cut back to 20 mg BID.   o Echo showed moderately reduced LVEF.  o On 7/1 - Cardiologist advised patient to increase his isosorbide ER from 30 mg to 60 mg daily . Counsel on importance of medication adherence o Confirms that Mr. Goffe is taking torsemide 20 mg twice daily as directed by Cardiologist - Reports that patient's weight today was 196 lbs, but 193 lbs yesterday.  - Inquire about what parameters patient has been given regarding his daily weights and fluid management. Mrs. Wigington denies having any current instruction beyond patient to weigh himself daily o Confirms that Mr. Deupree is taking isosorbide ER 60 mg daily as directed . Counsel on importance of blood pressure control and monitoring o Reports last checked blood pressure last week and reading was 126/76. o Encourage checking blood pressure more regularly and keeping log o Mrs. Ashkar expresses interest in having large print blood pressure and blood sugar logs. Myles Rosenthal on importance of blood sugar control and monitoring o Reports patient's morning fasting readings have been between 120-140s o Denies any low blood sugars o Confirms patient is continuing to take Trulicity 4.13 mg once weekly and [name] insulin 60 units daily . Will mail patient large print blood sugar and blood pressure logs, as requested . Place coordination of care call to patient's Cardiologist to reports patient's weight today of 196 lbs, but 193 lbs yesterday. Will ask about any  monitoring instructions that the patient should have with weight/fluid changes o Note next appointment with Cardiology scheduled for 7/22  Patient Self Care Activities:  . Check blood sugar, blood pressure and daily weight as directed by providers  . Takes medications, with assistance of wife, as directed . Patient attends scheduled medical appointments  Initial goal documentation        The patient verbalized understanding of  instructions provided today and declined a print copy of patient instruction materials.   Telephone follow up appointment with care management team member scheduled for: 7/31  [name], PharmD, [facility] [phone]

## 2018-10-02 NOTE — Telephone Encounter (Signed)
I spoke to the patient's wife who mentioned that his weight is stabilizing today, no gain.  She will record it daily and have it available for the patient's 7/22 appointment.  She verbalized understanding.

## 2018-10-02 NOTE — Telephone Encounter (Signed)
Real Cons Pharmacist, is calling from [facility] was calling patients fluid monitoring. Patients weight increased 3 lbs over night and patient and wife are looking for what dosage to continue with as he fluctuates up and down. No shortness or breath or huge concerns, just curious of what to do as weight changes over night. Please advise patient.

## 2018-10-02 NOTE — Chronic Care Management (AMB) (Signed)
Chronic Care Management   Follow Up Note   10/02/2018 Name: Lucas Caldwell MRN: [id number] DOB: April 07, 1933  Referred by: [name], DO Reason for referral : Chronic Care Management (Patient Phone Call)   Lucas Caldwell is a 85 y.o. year old male who is a primary care patient of [name], DO. The CCM team was consulted for assistance with chronic disease management and care coordination needs.  Lucas Caldwell has a past medical history including but not limited to type 2 diabetes, CKD, hyperlipidemia, COPD, atrial fibrillation, hypertension, coronary artery disease, Chronic HFpEF, hypothyroidism, and CML  I reached out to Lucas Caldwell by phone today.   Review of patient status, including review of consultants reports, relevant laboratory and other test results, and collaboration with appropriate care team members and the patient's provider was performed as part of comprehensive patient evaluation and provision of chronic care management services.    Goals Addressed            This Visit's Progress   . COMPLETED: Medication Assistance       Current Barriers:  . Financial . Limited vision  Pharmacist Clinical Goal(s):  [name] Over the next 30 days, patient will work with CM Pharmacist to address needs related to medication assistance  Interventions:  Lucas Caldwell confirms patient has received [name] and Proventil from the [employer] patient assistance program  Reports that Lucas Caldwell has discontinued Memory Dance and is now taking Dulera and Proventil as needed.   Counsel on difference between maintenance and rescue inhalers. Counsel patient to take [facility] - 2 puffs twice daily and rinse mouth out after use as directed.  Patient Self Care Activities:  . Check blood sugar, blood pressure and daily weight as directed by providers  o To have blood sugar and blood pressure log available for next phone appointment . Takes medications, with assistance of wife,  as directed . Patient attends scheduled medical appointments  Please see past updates related to this goal by clicking on the "Past Updates" button in the selected goal      . Medication Managment       Current Barriers:  . Financial . Limited vision . Lack of blood sugar and blood pressure results for clinical team  Pharmacist Clinical Goal(s):  [name] Over the next 30 days, patient will work with CM Pharmacist to address needs related to medication adherence and diabetes management  Interventions: . Perform chart review o Labwork from 6/26 showed increase in Scr. To repeat BMP in ~2 weeks.  o Dr. Saunders Revel instructed patient to hold torsemide for 1 day, then cut back to 20 mg BID.   o Echo showed moderately reduced LVEF.  o On 7/1 - Cardiologist advised patient to increase his isosorbide ER from 30 mg to 60 mg daily . Counsel on importance of medication adherence o Confirms that Lucas Caldwell is taking torsemide 20 mg twice daily as directed by Cardiologist - Reports that patient's weight today was 196 lbs, but 193 lbs yesterday.  - Inquire about what parameters patient has been given regarding his daily weights and fluid management. Lucas Caldwell denies having any current instruction beyond patient to weigh himself daily o Confirms that Lucas Caldwell is taking isosorbide ER 60 mg daily as directed . Counsel on importance of blood pressure control and monitoring o Reports last checked blood pressure last week and reading was 126/76. o Encourage checking blood pressure more regularly and keeping log o Lucas Caldwell expresses interest in  having large print blood pressure and blood sugar logs. Lucas Caldwell on importance of blood sugar control and monitoring o Reports patient's morning fasting readings have been between 120-140s o Denies any low blood sugars o Confirms patient is continuing to take Trulicity 1.76 mg once weekly and [name] insulin 60 units daily . Will mail patient large print blood  sugar and blood pressure logs, as requested . Place coordination of care call to patient's Cardiologist to reports patient's weight today of 196 lbs, but 193 lbs yesterday. Will ask about any monitoring instructions that the patient should have with weight/fluid changes o Note next appointment with Cardiology scheduled for 7/22  Patient Self Care Activities:  . Check blood sugar, blood pressure and daily weight as directed by providers  . Takes medications, with assistance of wife, as directed . Patient attends scheduled medical appointments  Initial goal documentation        Plan  Telephone follow up appointment with care management team member scheduled for: 7/31 at 3 pm  [name], PharmD, [place] [phone]

## 2018-10-06 ENCOUNTER — Telehealth: Payer: Self-pay | Admitting: Internal Medicine

## 2018-10-06 NOTE — Telephone Encounter (Signed)

## 2018-10-07 ENCOUNTER — Other Ambulatory Visit: Payer: Self-pay

## 2018-10-07 ENCOUNTER — Encounter: Payer: Self-pay | Admitting: Nurse Practitioner

## 2018-10-07 ENCOUNTER — Ambulatory Visit (INDEPENDENT_AMBULATORY_CARE_PROVIDER_SITE_OTHER): Payer: Medicare HMO | Admitting: Nurse Practitioner

## 2018-10-07 VITALS — BP 138/70 | HR 80 | Ht 64.0 in | Wt 202.8 lb

## 2018-10-07 DIAGNOSIS — N184 Chronic kidney disease, stage 4 (severe): Secondary | ICD-10-CM

## 2018-10-07 DIAGNOSIS — I5042 Chronic combined systolic (congestive) and diastolic (congestive) heart failure: Secondary | ICD-10-CM

## 2018-10-07 DIAGNOSIS — E785 Hyperlipidemia, unspecified: Secondary | ICD-10-CM | POA: Diagnosis not present

## 2018-10-07 DIAGNOSIS — I10 Essential (primary) hypertension: Secondary | ICD-10-CM | POA: Diagnosis not present

## 2018-10-07 DIAGNOSIS — I25110 Atherosclerotic heart disease of native coronary artery with unstable angina pectoris: Secondary | ICD-10-CM | POA: Diagnosis not present

## 2018-10-07 MED ORDER — ISOSORBIDE MONONITRATE ER 60 MG PO TB24: 90.0000 mg | ORAL_TABLET | Freq: Every day | ORAL | 3 refills | Status: DC

## 2018-10-07 NOTE — Progress Notes (Signed)
Office Visit    Patient Name: Lucas Caldwell Date of Encounter: 10/07/2018  Primary Care Provider:  [name], DO Primary Cardiologist:  [name], MD  Chief Complaint    85 year old male with a history of CAD status post prior RCA and circumflex stenting, ischemic cardiomyopathy, chronic combined heart failure, hypertension, hyperlipidemia, type 2 diabetes mellitus, paroxysmal atrial fibrillation, chronic kidney disease stage III-IV, CML, iron deficiency anemia, GI bleed due to duodenal ulcers in October 2019, BPH, and hypothyroidism, who presents for follow-up of dyspnea on exertion.  Past Medical History    Past Medical History:  Diagnosis Date   Arthritis    "probably in his legs" (08/15/2016)   Blind right eye    BPH (benign prostatic hyperplasia)    Breast asymmetry    Left breast is larger, present for several years.   CAD (coronary artery disease)    a. Cath in the late 90's - reportedly ok;  b. 2014 s/p stenting x 2 @ [facility]; c 08/19/16 Cath/PCI with DES -> RCA, plan to treat LM 70% medically. Seen by surgery and felt to be too high risk for CABG; d. 08/2018 Cath: LM 70 (iFR 0.86), LAD 20ost, 40p, 50/57m, D1 70, LCX patent stent, OM1 30, RCA patent stent, 64m ISR, 40d, RPDA 30. PCWP 8. CO/CI 4.0/2.1.   Chronic combined systolic (congestive) and diastolic (congestive) heart failure ([place])    a. Previously reduced EF-->50% by echo in 2012;  b. 06/2015 Echo: EF 50-55%  c. 07/2016 Echo: EF 45-50%; d. 12/2017 Echo: EF 55%, Gr1 DD, mild MR; e. 08/2018 Echo: EF 35-40%, mildly dil PA.   Chronic kidney disease (CKD), stage IV (severe) (HCC)    CML (chronic myelocytic leukemia) (HCC)    GERD (gastroesophageal reflux disease)    GIB (gastrointestinal bleeding)    a. 12/2017 3 unit PRBC GIB in setting of coumadin-->Endo/colon multiple duodenal ulcers and a single bleeding ulcer in the proximal ascending colon status post hemostatic clipping x2.   Gout     Hyperlipemia    Hypertension    Hypothyroidism    Iron deficiency anemia    Ischemic cardiomyopathy    a. Previously reduced EF-->50% by echo in 2012;  b. 06/2015 Echo: EF 50-55%, Gr2 DD, mild MR, mildly dil LA, Ao sclerosis, mild TR;  c. 07/2016 Echo: EF 45-50%; d. 12/2017 Echo: EF 55%, Gr1 DD, mild MR; e. 08/2018 Echo: EF 35-40%.   Migraine    "in the 1960s" (08/15/2016)   PAF (paroxysmal atrial fibrillation) (HCC)    a. ? Dx 2014-->s/p DCCV;  b. CHA2DS2VASc = 6--> Coumadin.   Prostate cancer ([place])    RBBB    Type II diabetes mellitus ([place])    Past Surgical History:  Procedure Laterality Date   CATARACT EXTRACTION W/ INTRAOCULAR LENS  IMPLANT, BILATERAL Bilateral    COLONOSCOPY N/A 01/13/2018   Procedure: COLONOSCOPY;  Surgeon: Lin Landsman, MD;  Location: [facility] ENDOSCOPY;  Service: Gastroenterology;  Laterality: N/A;   COLONOSCOPY WITH PROPOFOL N/A 01/01/2018   Procedure: COLONOSCOPY WITH PROPOFOL;  Surgeon: Lin Landsman, MD;  Location: [facility] ENDOSCOPY;  Service: Gastroenterology;  Laterality: N/A;   CORONARY ANGIOPLASTY WITH STENT PLACEMENT  09/2012   2 stents   CORONARY STENT INTERVENTION N/A 08/19/2016   Procedure: Coronary Stent Intervention;  Surgeon: [name], MD;  Location: [place] [facility] LAB;  Service: Cardiovascular;  Laterality: N/A;   ESOPHAGEAL MANOMETRY N/A 12/16/2017   Procedure: ESOPHAGEAL MANOMETRY (EM);  Surgeon: Marius Ditch,  Tally Due, MD;  Location: [place];  Service: Gastroenterology;  Laterality: N/A;   ESOPHAGOGASTRODUODENOSCOPY N/A 12/20/2016   Procedure: ESOPHAGOGASTRODUODENOSCOPY (EGD);  Surgeon: Lin Landsman, MD;  Location: [facility] ENDOSCOPY;  Service: Gastroenterology;  Laterality: N/A;   ESOPHAGOGASTRODUODENOSCOPY N/A 01/13/2018   Procedure: ESOPHAGOGASTRODUODENOSCOPY (EGD);  Surgeon: Lin Landsman, MD;  Location: [facility] ENDOSCOPY;  Service: Gastroenterology;  Laterality: N/A;   ESOPHAGOGASTRODUODENOSCOPY (EGD)  WITH PROPOFOL N/A 11/03/2017   Procedure: ESOPHAGOGASTRODUODENOSCOPY (EGD) WITH PROPOFOL;  Surgeon: Lin Landsman, MD;  Location: [facility] ENDOSCOPY;  Service: Gastroenterology;  Laterality: N/A;   EYE SURGERY     HERNIA REPAIR     "navel"   LEFT HEART CATH AND CORONARY ANGIOGRAPHY N/A 08/14/2016   Procedure: Left Heart Cath and Coronary Angiography;  Surgeon: [name], MD;  Location: [place] [facility];  Service: Cardiovascular;  Laterality: N/A;   RIGHT/LEFT HEART CATH AND CORONARY ANGIOGRAPHY N/A 09/01/2018   Procedure: RIGHT/LEFT HEART CATH AND CORONARY ANGIOGRAPHY;  Surgeon: [name], MD;  Location: [place] [facility];  Service: Cardiovascular;  Laterality: N/A;   TOE AMPUTATION Right    "big toe"    Allergies  Allergies  Allergen Reactions   Clopidogrel Anaphylaxis    altered mental status;  (electrolytes "out of wack" and confusion per family; THEY DO NOT REMEMBER ANY OTHER REACTION)- MSB 10/10/15   Ciprofloxacin Itching   Mirtazapine Diarrhea    Bad dreams    Spironolactone     gynecomastia    History of Present Illness    85 year old male with above complex past medical history including CAD, mixed ischemic and nonischemic cardiomyopathy, HFrEF, hypertension, hyperlipidemia, type 2 diabetes mellitus, paroxysmal atrial fibrillation, chronic kidney disease stage III-IV, CML, iron deficiency anemia, GI bleed, BPH, and hypothyroidism. He previously underwent stenting of the right coronary artery in June 2018 and is known to have residual 70% left main disease. He was evaluated by CT surgery at that time and was not felt to be a suitable surgical candidate. In that setting, he has been medically managed. He does have a history of LV dysfunction with an EF of 45 to 50% by echo in May 2018 the more recent echo in October 2019 showed an EF of 55% with grade 1 diastolic dysfunction. It was in October 2019 that he was admitted with GI bleeding in the  setting of chronic Coumadin and was found to have multiple duodenal ulcer as well as oozing from a recently treated colonic angiectasia. After outpatient follow-up with GI, Coumadin was resumed in November 2019.  Throughout this year, he has been having dyspnea on exertion.  This persisted despite diuretic adjustment and at follow-up visit in June, decision was made to pursue diagnostic catheterization.  This showed a 70% left main stenosis with and abnl iFR of 0.86, though drop in iFR was due to diffuse prox LAD dzs, and there was only a small gradient involving the ostial LM.  In that setting, his Isosorbide was increased to 30 mg daily.  In the setting of normal filling pressures, torsemide dose was reduced to 40 mg in the morning and 20 mg in the p.m.   F/u Creat on 6/26, showed a rise in creat to 1.74 and he was advised to hold torsemide on 6/27 and then resume @ 20mg  BID.  F/u echo showed sl reduction in LV fxn, now 35-40%.  He was seen in clinic on July 1, at which time he was reporting ongoing dyspnea with minimal exertion.  He was  not having any chest pain.  I titrated his isosorbide from 30 mg daily to 60 mg daily.  With this change, he thinks he noted a slight improvement in exercise tolerance.  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  When he does this, with his son's assistance, he does feel short of breath.  He thinks he can walk a reasonable distance prior to experiencing dyspnea.  Again, he has not had any chest pain and also denies palpitations, PND, orthopnea, dizziness, syncope, edema, or early satiety.  Interestingly, his weight is up today compared to his last visit and he thinks he is just been eating more and doing less.  Home Medications    Prior to Admission medications   Medication Sig Start Date End Date Taking? Authorizing Provider  acetaminophen (TYLENOL) 500 MG tablet Take 1,000 mg by mouth  every 6 (six) hours as needed for moderate pain or headache.     [provider]  Alcohol Swabs 70 % PADS Use when checking blood sugar up to 3x daily 04/09/17   Parks Ranger, [name], DO  allopurinol (ZYLOPRIM) 100 MG tablet TAKE 1 TABLET EVERY DAY 09/28/18   Karamalegos, [name], DO  aspirin EC 81 MG tablet Take 1 tablet (81 mg total) by mouth daily. 08/28/18   End, [name], MD  Blood Glucose Monitoring Suppl (TRUE METRIX AIR GLUCOSE METER) DEVI 1 Device by Does not apply route 2 (two) times daily. 06/08/18   Karamalegos, [name], DO  bosutinib (BOSULIF) 100 MG tablet Take 100 mg by mouth at bedtime as needed (tired/not feeling well). Take with food.     [provider]  Dulaglutide (TRULICITY) 1.47 WG/9.5AO SOPN Inject 0.75 mg into the skin once a week. Patient taking differently: Inject 0.75 mg into the skin every Saturday.  10/31/17   Karamalegos, [name], DO  enalapril (VASOTEC) 5 MG tablet TAKE ONE TABLET BY MOUTH EVERY DAY 09/02/18   Karamalegos, [name], DO  gabapentin (NEURONTIN) 300 MG capsule Take 2 capsules (600 mg total) by mouth 2 (two) times daily. Max dose = 1200mg  daily for kidney function 07/12/18   Parks Ranger, [name], DO  glucose blood (TRUE METRIX BLOOD GLUCOSE TEST) test strip Use as instructed 06/08/18   [name], DO  Insulin Glargine ([name] KWIKPEN) 100 UNIT/ML SOPN Inject 60 Units into the skin daily.    [provider]  Insulin Pen Needle 31G X 5 MM MISC 31 g by Does not apply route as directed. 04/16/17   [name], NP  isosorbide mononitrate (IMDUR) 60 MG 24 hr tablet Take 1 tablet (60 mg total) by mouth daily. 09/16/18 12/15/18  Theora Gianotti, NP  levothyroxine (SYNTHROID) 25 MCG tablet TAKE 1 TABLET (25 MCG TOTAL) BY MOUTH DAILY BEFORE BREAKFAST. 09/28/18   Karamalegos, [name], DO  loperamide (IMODIUM A-D) 2 MG tablet Take 2 mg by mouth 3 (three) times daily as needed for diarrhea  or loose stools.     [provider]  Menthol-Zinc Oxide (GOLD BOND EX) Apply 1 application topically daily as needed (muscle pain).    [provider]  metoprolol succinate (TOPROL XL) 25 MG 24 hr tablet Take 1 tablet (25 mg total) by mouth daily. 03/06/18   End, [name], MD  mometasone-formoterol (DULERA) 200-5 MCG/ACT AERO Inhale 2 puffs into the lungs 2 (two) times daily.    [provider]  Multiple Vitamins-Minerals (  MULTIVITAMIN ADULTS 50+ PO) Take 1 tablet by mouth every morning.    [provider]  nitroGLYCERIN (NITROSTAT) 0.4 MG SL tablet Place 1 tablet (0.4 mg total) under the tongue every 5 (five) minutes x 3 doses as needed for chest pain. 04/22/18   Theora Gianotti, NP  omeprazole (PRILOSEC) 40 MG capsule TAKE ONE CAPSULE BY MOUTH TWICE DAILY Patient taking differently: Take 40 mg by mouth daily.  05/14/18   Lin Landsman, MD  Polyethyl Glycol-Propyl Glycol (SYSTANE OP) Place 1 drop into both eyes daily as needed (dry eyes).    [provider]  PROVENTIL HFA 108 (90 Base) MCG/ACT inhaler Inhale 2 puffs into the lungs every 6 (six) hours as needed for wheezing or shortness of breath. 07/16/18   Karamalegos, [name], DO  rosuvastatin (CRESTOR) 5 MG tablet Take 1 tablet (5 mg total) by mouth daily. Patient taking differently: Take 5 mg by mouth at bedtime.  07/14/18 10/12/18  End, [name], MD  torsemide (DEMADEX) 20 MG tablet Take 1 tablet (20 mg total) by mouth 2 (two) times daily. 09/14/18   End, [name], MD  warfarin (COUMADIN) 3 MG tablet Take 1 tablet (3 mg total) by mouth daily. Take as directed by the coumadin clinic. Patient taking differently: Take 3-4.5 mg by mouth See admin instructions. Take 3 mg at night on Sun, Tue, Wed, Thur, and Sat.  Take 4.5 mg at night on Mon and Fri. 04/17/18   End, [name], MD    Review of Systems    Ongoing dyspnea on exertion though potentially slightly improved on a higher  dose of isosorbide.  He denies chest pain, palpitations, PND, orthopnea, dizziness, syncope, edema, or early satiety.  All other systems reviewed and are otherwise negative except as noted above.  Physical Exam    VS:  BP 138/70 (BP Location: Left Arm, Patient Position: Sitting, Cuff Size: Normal)    Pulse 80    Ht 5\' 4"  (1.626 m)    Wt 202 lb 12.8 oz (92 kg)    SpO2 95%    BMI 34.81 kg/m  , BMI Body mass index is 34.81 kg/m. GEN: Well nourished, well developed, in no acute distress. HEENT: normal. Neck: Supple, no JVD, carotid bruits, or masses. Cardiac: RRR, no murmurs, rubs, or gallops. No clubbing, cyanosis, edema.  Radials/DP/PT 2+ and equal bilaterally.  Respiratory:  Respirations regular and unlabored, clear to auscultation bilaterally. GI: Soft, nontender, nondistended, BS + x 4. MS: no deformity or atrophy. Skin: warm and dry, no rash. Neuro:  Strength and sensation are intact. Psych: Normal affect.  Accessory Clinical Findings    ECG personally reviewed by me today -regular sinus rhythm, 81, first-degree AV block, right bundle branch block, left axis deviation with left anterior fascicular block, borderline LVH- no acute changes.  Lab Results  Component Value Date   WBC 7.9 08/28/2018   HGB 12.6 (L) 08/28/2018   HCT 40.7 08/28/2018   MCV 85.3 08/28/2018   PLT 199 08/28/2018   Lab Results  Component Value Date   CREATININE 1.74 (H) 09/11/2018   BUN 24 09/11/2018   NA 132 (L) 09/11/2018   K 4.3 09/11/2018   CL 97 09/11/2018   CO2 22 09/11/2018   Lab Results  Component Value Date   ALT 14 08/28/2018   AST 23 08/28/2018   ALKPHOS 82 08/28/2018   BILITOT 0.5 08/28/2018   Lab Results  Component Value Date   CHOL 110 08/28/2018   HDL 28 (  L) 08/28/2018   LDLCALC 51 08/28/2018   TRIG 155 (H) 08/28/2018   CHOLHDL 3.9 08/28/2018     Assessment & Plan    1.  Coronary artery disease: Status post recent catheterization showing stable anatomy with patent RCA and  circumflex stents and ongoing 70% ostial left main disease.  iFR was abnormal at 0.86, however this was felt to be secondary to diffuse LAD disease and continued medical therapy was recommended.  He has continued to have dyspnea on exertion though this was slightly improved with titration of nitrates from 30 mg daily to 60 mg daily.  Given some ongoing dyspnea exertion, I will titrate isosorbide to 90 mg daily.  He otherwise remains on aspirin, beta-blocker, ACE inhibitor, and statin therapy.  2.  Chronic combined systolic diastolic congestive heart failure/ischemic cardiomyopathy: EF 35 to 40%.  He remains euvolemic on examination and as noted above, if anything his dyspnea has been better.  His weight is up and he thinks it simply because he is eating better.  I do not appreciate any JVD or edema.  He was due for a follow-up basic metabolic panel last week but this was not performed.  I will obtain.  Continue beta-blocker and ACE inhibitor.  Pending renal function, could consider switching to [facility] if financially feasible.  Previously intolerant to spironolactone due to gynecomastia but we could consider eplerenone.  3.  Paroxysmal atrial fibrillation: Maintaining sinus rhythm on beta-blocker.  [name] anticoagulated with Coumadin.  4.  Essential hypertension: Stable.  5.  Hyperlipidemia: LDL 51 in June.  LFTs within normal limits at the time.  Continue rosuvastatin.  6.  CKD III-IV: Most recent creatinine was elevated at 1.74 in June 26.  He was scheduled to have follow-up basic metabolic panel about 2 weeks ago but this was not performed.  We will follow-up this week.  7.  Disposition: Follow-up basic metabolic panel.  Follow-up in clinic in 4 weeks or sooner if necessary.   [name], NP 10/07/2018, 6:18 PM

## 2018-10-07 NOTE — Patient Instructions (Signed)
Medication Instructions:  Your physician has recommended you make the following change in your medication:  1- INCREASE Imdur take 1.5 tablets (90mg  total) once daily   If you need a refill on your cardiac medications before your next appointment, please call your pharmacy.   Lab work: None ordered  If you have labs (blood work) drawn today and your tests are completely normal, you will receive your results only by: [name] MyChart Message (if you have MyChart) OR . A paper copy in the mail If you have any lab test that is abnormal or we need to change your treatment, we will call you to review the results.  Testing/Procedures: None ordered   Follow-Up: At [facility], you and your health needs are our priority.  As part of our continuing mission to provide you with exceptional heart care, we have created designated Provider Care Teams.  These Care Teams include your primary Cardiologist (physician) and Advanced Practice Providers (APPs -  Physician Assistants and Nurse Practitioners) who all work together to provide you with the care you need, when you need it. You will need a follow up appointment in 4-6 weeks. You may see [name], MD or [name], NP.

## 2018-10-08 ENCOUNTER — Other Ambulatory Visit: Payer: Self-pay | Admitting: Gastroenterology

## 2018-10-08 ENCOUNTER — Telehealth: Payer: Self-pay | Admitting: *Deleted

## 2018-10-08 DIAGNOSIS — I5042 Chronic combined systolic (congestive) and diastolic (congestive) heart failure: Secondary | ICD-10-CM

## 2018-10-08 DIAGNOSIS — K219 Gastro-esophageal reflux disease without esophagitis: Secondary | ICD-10-CM

## 2018-10-08 DIAGNOSIS — Z79899 Other long term (current) drug therapy: Secondary | ICD-10-CM

## 2018-10-08 NOTE — Telephone Encounter (Signed)
-----   Message from Theora Gianotti, NP sent at 10/07/2018  6:31 PM EDT ----- Hessie Dibble,  Mr. Petteway was supposed to have a f/u bmet 2 wks ago and it doesn't look like it ever happened.  I didn't realize it until I was dictating tonight.  I've placed an order.  Would you pls mind reaching out to him to let him know to have it done on Thurs or Fri.  Thanks,  Gerald Stabs

## 2018-10-08 NOTE — Telephone Encounter (Signed)
Called patient and he verbalized understanding to go today or tomorrow to the [facility] for [employer]. He said if he didn't get there today he would go tomorrow. Order is in.

## 2018-10-09 ENCOUNTER — Telehealth: Payer: Self-pay | Admitting: Internal Medicine

## 2018-10-09 ENCOUNTER — Other Ambulatory Visit
Admission: RE | Admit: 2018-10-09 | Discharge: 2018-10-09 | Disposition: A | Discharge status: Home / Self Care | Payer: Medicare HMO | Source: Ambulatory Visit | Attending: Nurse Practitioner | Admitting: Nurse Practitioner

## 2018-10-09 DIAGNOSIS — Z79899 Other long term (current) drug therapy: Secondary | ICD-10-CM | POA: Diagnosis not present

## 2018-10-09 DIAGNOSIS — I5042 Chronic combined systolic (congestive) and diastolic (congestive) heart failure: Secondary | ICD-10-CM | POA: Diagnosis not present

## 2018-10-09 LAB — BASIC METABOLIC PANEL
Anion gap: 11 (ref 5–15)
BUN: 24 mg/dL — ABNORMAL HIGH (ref 8–23)
CO2: 24 mmol/L (ref 22–32)
Calcium: 9.2 mg/dL (ref 8.9–10.3)
Chloride: 101 mmol/L (ref 98–111)
Creatinine, Ser: 1.25 mg/dL — ABNORMAL HIGH (ref 0.61–1.24)
GFR calc Af Amer: >60 mL/min (ref >60)
GFR calc non Af Amer: 53 mL/min — ABNORMAL LOW (ref >60)
Glucose, Bld: 166 mg/dL — ABNORMAL HIGH (ref 70–99)
Potassium: 4 mmol/L (ref 3.5–5.1)
Sodium: 136 mmol/L (ref 135–145)

## 2018-10-09 NOTE — Telephone Encounter (Signed)
Patients wife returning phone call for lab results. Please advise

## 2018-10-09 NOTE — Telephone Encounter (Signed)
No answer. Left message to call back.   

## 2018-10-12 NOTE — Telephone Encounter (Signed)
Spoke with wife, ok per DPR. She verbalized understanding of lab results and plan of care from 10/09/18.

## 2018-10-16 ENCOUNTER — Other Ambulatory Visit: Payer: Self-pay | Admitting: Cardiovascular Disease

## 2018-10-16 ENCOUNTER — Ambulatory Visit: Payer: Self-pay | Admitting: Pharmacist

## 2018-10-16 ENCOUNTER — Telehealth: Payer: Medicare HMO

## 2018-10-16 DIAGNOSIS — E1121 Type 2 diabetes mellitus with diabetic nephropathy: Secondary | ICD-10-CM

## 2018-10-16 DIAGNOSIS — IMO0002 Reserved for concepts with insufficient information to code with codable children: Secondary | ICD-10-CM

## 2018-10-16 NOTE — Chronic Care Management (AMB) (Signed)
  Chronic Care Management   Follow Up Note   10/16/2018 Name: DAIMION ADAMCIK MRN: [id number] DOB: [birth year]  Referred by: [name], DO Reason for referral : Chronic Care Management (Patient Phone Call)   LEWELLYN FULTZ is a 85 y.o. year old male who is a primary care patient of [name], DO. The CCM team was consulted for assistance with chronic disease management and care coordination needs.    I reached out to [name] by phone today as scheduled to follow up regarding diabetes management. Mrs. Aho answers the phone and reports that they are just heading out of the house to go to the store. She asks that we reschedule.  Plan  The care management team will reach out to the patient again over the next 14 days.   [name], PharmD, [place] [employer] [phone]

## 2018-10-20 ENCOUNTER — Other Ambulatory Visit: Payer: Self-pay | Admitting: *Deleted

## 2018-10-20 ENCOUNTER — Telehealth: Payer: Self-pay | Admitting: Internal Medicine

## 2018-10-20 MED ORDER — ISOSORBIDE MONONITRATE ER 60 MG PO TB24: 90.0000 mg | ORAL_TABLET | Freq: Every day | ORAL | 3 refills | Status: DC

## 2018-10-20 NOTE — Telephone Encounter (Signed)
Requested Prescriptions   Signed Prescriptions Disp Refills  . isosorbide mononitrate (IMDUR) 60 MG 24 hr tablet 90 tablet 3    Sig: Take 1.5 tablets (90 mg total) by mouth daily.    Authorizing Provider: END, CHRISTOPHER    Ordering User: Britt Bottom

## 2018-10-20 NOTE — Telephone Encounter (Signed)
°*  STAT* If patient is at the pharmacy, call can be transferred to refill team.   1. Which medications need to be refilled? (please list name of each medication and dose if known) isosorbide 90 mg daily   2. Which pharmacy/location (including street and city if local pharmacy) is medication to be sent to? [facility]  3. Do they need a 30 day or 90 day supply? [place]

## 2018-10-27 ENCOUNTER — Telehealth: Payer: Self-pay | Admitting: Family Medicine

## 2018-10-27 ENCOUNTER — Other Ambulatory Visit: Payer: Self-pay | Admitting: Pharmacy Technician

## 2018-10-27 NOTE — Patient Outreach (Signed)
[facility]) Care Management  10/27/2018  Lucas Caldwell Aug 11, 1933 [id number]  Incoming call received from patient's wife in regards the need to obtain a refill from [place] cares for her husband's medication.  Spoke to [place], [place] identifiers verified.  Provided Lucas Caldwell the phone number to [employer] [phone] and to [facility] [phone]. [name] repeated the numbers back to me. Informed patient best to call first thing in the morning and later in the evening before 5:30pm to avoid long hold times. [name] verbazlied understanding. Confirmed patient's wife had phone number if she had any issues with ordering the refills.  [name] also mentioned that she was unable to get one of the insulin pens to work. Informed Lucas Caldwell to tell the representative she speaks to that she was having problems with the pen malfunctioning. [name] verbalized she would inform them.  [name] P. Yamila Cragin, [place] Management [phone]

## 2018-10-27 NOTE — Telephone Encounter (Signed)
Pt called  Requesting refill on omeprazole 40mg  called into [facility]

## 2018-10-28 ENCOUNTER — Other Ambulatory Visit: Payer: Self-pay

## 2018-10-28 ENCOUNTER — Ambulatory Visit (INDEPENDENT_AMBULATORY_CARE_PROVIDER_SITE_OTHER): Payer: Medicare HMO

## 2018-10-28 DIAGNOSIS — I480 Paroxysmal atrial fibrillation: Secondary | ICD-10-CM | POA: Diagnosis not present

## 2018-10-28 DIAGNOSIS — Z7901 Long term (current) use of anticoagulants: Secondary | ICD-10-CM | POA: Diagnosis not present

## 2018-10-28 LAB — POCT INR: INR: 3.2 — AB (ref 2.0–3.0)

## 2018-10-28 NOTE — Patient Instructions (Signed)
Please skip coumadin tonight, then continue dosage of 1 TABLET EVERY DAY EXCEPT 1.5 TABLETS ON MONDAYS & FRIDAYS. Recheck INR in 6 weeks.

## 2018-10-28 NOTE — Telephone Encounter (Addendum)
Advised patient's spouse  to call Dr. Charlie Pitter gastroenterologist office they had Rx this for patient.

## 2018-10-29 ENCOUNTER — Ambulatory Visit (INDEPENDENT_AMBULATORY_CARE_PROVIDER_SITE_OTHER): Payer: Medicare HMO | Admitting: Pharmacist

## 2018-10-29 DIAGNOSIS — IMO0002 Reserved for concepts with insufficient information to code with codable children: Secondary | ICD-10-CM

## 2018-10-29 DIAGNOSIS — I5032 Chronic diastolic (congestive) heart failure: Secondary | ICD-10-CM

## 2018-10-29 DIAGNOSIS — E1165 Type 2 diabetes mellitus with hyperglycemia: Secondary | ICD-10-CM

## 2018-10-29 DIAGNOSIS — E1121 Type 2 diabetes mellitus with diabetic nephropathy: Secondary | ICD-10-CM | POA: Diagnosis not present

## 2018-10-29 DIAGNOSIS — I10 Essential (primary) hypertension: Secondary | ICD-10-CM

## 2018-10-29 NOTE — Chronic Care Management (AMB) (Signed)
  Chronic Care Management   Follow Up Note   10/29/2018 Name: Lucas Caldwell MRN: [id number] DOB: Apr 10, 1933  Referred by: [name], DO Reason for referral : Chronic Care Management (Patient Phone Call)   Lucas Caldwell is a 85 y.o. year old male who is a primary care patient of [name], DO. The CCM team was consulted for assistance with chronic disease management and care coordination needs. Lucas Caldwell has a past medical history including but not limited to type 2 diabetes, CKD, hyperlipidemia, COPD, atrial fibrillation, hypertension, coronary artery disease, Chronic HFpEF, hypothyroidism, and CML  I reached out to Lucas Caldwell and Lucas Caldwell by phone today.   Review of patient status, including review of consultants reports, relevant laboratory and other test results, and collaboration with appropriate care team members and the patient's provider was performed as part of comprehensive patient evaluation and provision of chronic care management services.    Goals Addressed            This Visit's Progress   . Medication Managment       Current Barriers:  . Financial . Limited vision  Pharmacist Clinical Goal(s):  Lucas Caldwell Kitchen Over the next 30 days, patient will work with CM Pharmacist to address needs related to medication adherence and diabetes management  Interventions: . Collaborate with [facility] CPhT . Perform chart review o Mr. Odem seen at [facility] yesterday - patient had supratherapuetic INR of 3.2. . Counsel on importance of medication adherence o Lucas Caldwell confirms that patient held warfarin dose yesterday as instructed by Anti-coag provider and is following warfarin dosing instructions as provided on printed calendar from visit yesterday . Counsel on importance of blood pressure monitoring, daily weight monitoring monitoring and follow up with Cardiologist . Counsel on importance of blood sugar control and monitoring o Review  recent blood sugar results (see below) o Denies any low blood sugars o Per [facility] CPhT, patient had problems with a defective [name] pen. Reiterate CPhT's advice to patient to follow up with [facility] when ordering refill of medication regarding how to obtain a replacement.  Patient Self Care Activities:  . Check blood sugar, blood pressure and daily weight as directed by providers  Date Fasting Blood Glucose  5 - August 128  6 - August 143  7 - August 155  8 - August  102  9 - August 136  10 - August 173  11 - August 130  12 - August 120  13 - August 145  Average 137   . Takes medications, with assistance of Caldwell, as directed . Patient attends scheduled medical appointments o Next appointment with Cardiologist on 8/26  Please see past updates related to this goal by clicking on the "Past Updates" button in the selected goal         Plan  Telephone follow up appointment with care management team member scheduled for: 9/8 at 1 pm  [name], PharmD, [place] [phone]

## 2018-10-29 NOTE — Patient Instructions (Signed)
Thank you allowing the Chronic Care Management Team to be a part of your care! It was a pleasure speaking with you today!     CCM (Chronic Care Management) Team    Janci Minor RN, BSN Nurse Care Coordinator  [phone]   [name] PharmD  Clinical Pharmacist  [phone]   Eula Fried LCSW Clinical Social Worker [phone]  Visit Information  Goals Addressed            This Visit's Progress   . Medication Managment       Current Barriers:  . Financial . Limited vision  Pharmacist Clinical Goal(s):  [name] Over the next 30 days, patient will work with CM Pharmacist to address needs related to medication adherence and diabetes management  Interventions: . Collaborate with [facility] C[facility] . Perform chart review o Mr. Stotler seen at [facility] yesterday - patient had supratherapuetic INR of 3.2. . Counsel on importance of medication adherence o Mrs. Earnest confirms that patient held warfarin dose yesterday as instructed by Anti-coag provider and is following warfarin dosing instructions as provided on printed calendar from visit yesterday . Counsel on importance of blood pressure monitoring, daily weight monitoring monitoring and follow up with Cardiologist . Counsel on importance of blood sugar control and monitoring o Review recent blood sugar results (see below) o Denies any low blood sugars o Per [facility] CPhT, patient had problems with a defective [name] pen. Reiterate CPhT's advice to patient to follow up with [facility] when ordering refill of medication regarding how to obtain a replacement.  Patient Self Care Activities:  . Check blood sugar, blood pressure and daily weight as directed by providers  Date Fasting Blood Glucose  5 - August 128  6 - August 143  7 - August 155  8 - August  102  9 - August 136  10 - August 173  11 - August 130  12 - August 120  13 - August 145  Average 137   . Takes medications, with assistance of wife, as  directed . Patient attends scheduled medical appointments o Next appointment with Cardiologist on 8/26  Please see past updates related to this goal by clicking on the "Past Updates" button in the selected goal         The patient verbalized understanding of instructions provided today and declined a print copy of patient instruction materials.   Telephone follow up appointment with care management team member scheduled for: 9/8 at [place], PharmD, [place] [phone]

## 2018-10-30 ENCOUNTER — Telehealth: Payer: Medicare HMO

## 2018-11-03 ENCOUNTER — Other Ambulatory Visit: Payer: Self-pay

## 2018-11-03 ENCOUNTER — Telehealth: Payer: Self-pay | Admitting: Gastroenterology

## 2018-11-03 DIAGNOSIS — K219 Gastro-esophageal reflux disease without esophagitis: Secondary | ICD-10-CM

## 2018-11-03 MED ORDER — OMEPRAZOLE 40 MG PO CPDR: 40.0000 mg | DELAYED_RELEASE_CAPSULE | Freq: Two times a day (BID) | ORAL | 0 refills | Status: DC

## 2018-11-03 NOTE — Telephone Encounter (Signed)
Medication has been refilled and sent to pharmacy, pt has been notified and verbalized understanding  

## 2018-11-03 NOTE — Telephone Encounter (Signed)
Pt  Wife is calling pt needs refill on rx Omeprazole 40 mg to [facility]

## 2018-11-04 ENCOUNTER — Encounter: Payer: Self-pay | Admitting: Family Medicine

## 2018-11-04 ENCOUNTER — Ambulatory Visit (INDEPENDENT_AMBULATORY_CARE_PROVIDER_SITE_OTHER): Payer: Medicare HMO | Admitting: Family Medicine

## 2018-11-04 ENCOUNTER — Other Ambulatory Visit: Payer: Self-pay

## 2018-11-04 VITALS — BP 111/71 | HR 85 | Temp 98.6°F | Resp 16 | Ht 64.0 in | Wt 207.0 lb

## 2018-11-04 DIAGNOSIS — E1142 Type 2 diabetes mellitus with diabetic polyneuropathy: Secondary | ICD-10-CM

## 2018-11-04 DIAGNOSIS — M170 Bilateral primary osteoarthritis of knee: Secondary | ICD-10-CM

## 2018-11-04 MED ORDER — TRAMADOL HCL 50 MG PO TABS: 50.0000 mg | ORAL_TABLET | Freq: Three times a day (TID) | ORAL | 0 refills | Status: AC | PRN

## 2018-11-04 NOTE — Patient Instructions (Addendum)
Thank you for coming to the office today.  Start Tramadol as needed 50mg  take one up to every 8 to 12 hours as needed only for pain, primarily diabetic neuropathy foot pain also arthritis joint pain if need.  Follow up with Dr End as scheduled next Wednesday  Please schedule a Follow-up Appointment to: Return in about 2 months (around 01/04/2019) for Diabetes.  If you have any other questions or concerns, please feel free to call the office or send a message through [place]. You may also schedule an earlier appointment if necessary.  Additionally, you may be receiving a survey about your experience at our office within a few days to 1 week by e-mail or mail. We value your feedback.  [name], DO [place]

## 2018-11-04 NOTE — Progress Notes (Signed)
Subjective:    Patient ID: Lucas Caldwell, male    DOB: 04-Jul-1933, 85 y.o.   MRN: [id number]  Lucas Caldwell is a 85 y.o. male presenting on 11/04/2018 for Knee Pain (Right side,left feet and heel pain onset 3 weeks, extra strength tylenol does helps)  Patient accompanied by spouse, Lucas Caldwell, both provide history.  HPI   Diabietic Neuropathy / Osteoarthritis multiple joints knees Chronic issue with neuropathy bilateral foot pain, in past failed lyrica, has been on Gabapentin 600mg  BID (x 4 of the 300mg ) he was lowered from 5 pills due to CKD. He has followed with Podiatry and has partial toe amputations in past, followed by [facility] Lucas Caldwell. - Today he is still complaining of neuropathy pain and says Gabapentin at current dose is only partially effective, asking for other options for his neuropathic pain. Never on SNRI Duloxetine Cymbalta. - he has diabetic shoes, previously ordered at prior visit - Denies any injury fall, ulcer, redness swelling   Additional update Follow-up Chronic combined systolic/diastolic CHF He says in past 1 month has had meds adjusted including increase isosorbide mononitrate from 60 to 90mg  for possible anginal symptoms, also consider other med adjustments, scheduled Next apt 11/11/18 Lucas End     Depression screen [facility] 2/9 11/04/2018 08/24/2018 06/09/2018  Decreased Interest 0 0 0  Down, Depressed, Hopeless 0 0 0  PHQ - 2 Score 0 0 0  Some recent data might be hidden    Social History   Tobacco Use  . Smoking status: Former Smoker    Packs/day: 2.00    Years: 15.00    Pack years: 30.00    Types: Cigarettes  . Smokeless tobacco: Former Systems developer  . Tobacco comment: quit 1979  Substance Use Topics  . Alcohol use: No    Comment: previously drank heavily - quit 1979.  . Drug use: No    Review of Systems Per HPI unless specifically indicated above     Objective:    BP 111/71   Pulse 85   Temp 98.6 F (37 C) (Oral)   Resp 16   Ht 5'  4" (1.626 m)   Wt 207 lb (93.9 kg)   SpO2 100%   BMI 35.53 kg/m   Wt Readings from Last 3 Encounters:  11/04/18 207 lb (93.9 kg)  10/07/18 202 lb 12.8 oz (92 kg)  09/16/18 193 lb 9.6 oz (87.8 kg)    Physical Exam Vitals signs and nursing note reviewed.  Constitutional:      General: [name] not in acute distress.    Appearance: [name] well-developed. [name] not diaphoretic.     Comments: Chronically ill-appearing but currently well and comfortable, cooperative  HENT:     Head: Normocephalic and atraumatic.  Eyes:     General:        Right eye: No discharge.        Left eye: No discharge.     Conjunctiva/sclera: Conjunctivae normal.  Cardiovascular:     Rate and Rhythm: Normal rate.  Pulmonary:     Effort: Pulmonary effort is normal.  Musculoskeletal:     Comments: Uses cane for ambulation, has R knee sleeve support  Skin:    General: Skin is warm and dry.     Findings: No erythema or rash.  Neurological:     Mental Status: [name] alert and oriented to person, place, and time.  Psychiatric:        Behavior: Behavior normal.  Comments: Well groomed, good eye contact, normal speech and thoughts    Recent Labs    05/06/18 1433 08/24/18 1514  HGBA1C 8.7* 8.3*       Assessment & Plan:   Problem List Items Addressed This Visit    Diabetic neuropathy associated with type 2 diabetes mellitus ([place]) - Primary    Chronic bilateral diabetic neuropathy in both feet, as complication of diabetes Additionally with R toe deformity, s/p partial amputation of R Great toe Evidence of callus formation both feet, heels and mid foot Failed Lyrica previously  Plan - Limited options discussed today based on comorbid conditions, current meds, chronic kidney disease - Offered Tramadol today as PRN neuropathic pain medication to help control painful flares, can help joints and neuropathy, caution sedation and side effects of opiate pain med as discussed, use short term only to start, 5 day  supply, can contact us if med is effective we can consider a longer term option for him if indicated, this is safer alternative compared to NSAIDs for this high risk patient with cardiovascular disease and CKD. - Continue Gabapentin 600mg  BID, cannot dose higher due to CKD - Considered SNRI Duloxetine for neuropathy but would likely take weeks and ultimately we decided to avoid a daily medication  F/u as needed      Relevant Medications   traMADol (ULTRAM) 50 MG tablet   Osteoarthritis of knees, bilateral   Relevant Medications   traMADol (ULTRAM) 50 MG tablet    See A&P above, Tramadol can help arthritis pain as well  #Cardiovascular Not focus of visit today, he should follow-up as scheduled with Lucas End next week 8/26, agree with further med management    Meds ordered this encounter  Medications  . traMADol (ULTRAM) 50 MG tablet    Sig: Take 1 tablet (50 mg total) by mouth every 8 (eight) hours as needed for up to 5 days for moderate pain (neuropathy).    Dispense:  15 tablet    Refill:  0     Follow up plan: Return in about 2 months (around 01/04/2019) for Diabetes.   [name], [facility] 11/04/2018, 3:24 PM

## 2018-11-04 NOTE — Assessment & Plan Note (Signed)
Chronic bilateral diabetic neuropathy in both feet, as complication of diabetes Additionally with R toe deformity, s/p partial amputation of R Great toe Evidence of callus formation both feet, heels and mid foot Failed Lyrica previously  Plan - Limited options discussed today based on comorbid conditions, current meds, chronic kidney disease - Offered Tramadol today as PRN neuropathic pain medication to help control painful flares, can help joints and neuropathy, caution sedation and side effects of opiate pain med as discussed, use short term only to start, 5 day supply, can contact us if med is effective we can consider a longer term option for him if indicated, this is safer alternative compared to NSAIDs for this high risk patient with cardiovascular disease and CKD. - Continue Gabapentin 600mg  BID, cannot dose higher due to CKD - Considered SNRI Duloxetine for neuropathy but would likely take weeks and ultimately we decided to avoid a daily medication  F/u as needed

## 2018-11-11 ENCOUNTER — Ambulatory Visit (INDEPENDENT_AMBULATORY_CARE_PROVIDER_SITE_OTHER): Payer: Medicare HMO | Admitting: Internal Medicine

## 2018-11-11 ENCOUNTER — Other Ambulatory Visit: Payer: Self-pay

## 2018-11-11 ENCOUNTER — Encounter: Payer: Self-pay | Admitting: Internal Medicine

## 2018-11-11 VITALS — BP 120/80 | HR 89 | Ht 64.0 in | Wt 202.5 lb

## 2018-11-11 DIAGNOSIS — I5022 Chronic systolic (congestive) heart failure: Secondary | ICD-10-CM

## 2018-11-11 DIAGNOSIS — R0602 Shortness of breath: Secondary | ICD-10-CM

## 2018-11-11 DIAGNOSIS — I480 Paroxysmal atrial fibrillation: Secondary | ICD-10-CM | POA: Diagnosis not present

## 2018-11-11 DIAGNOSIS — I25110 Atherosclerotic heart disease of native coronary artery with unstable angina pectoris: Secondary | ICD-10-CM

## 2018-11-11 DIAGNOSIS — N62 Hypertrophy of breast: Secondary | ICD-10-CM

## 2018-11-11 MED ORDER — RANOLAZINE ER 500 MG PO TB12: 500.0000 mg | ORAL_TABLET | Freq: Two times a day (BID) | ORAL | 2 refills | Status: DC

## 2018-11-11 NOTE — Patient Instructions (Signed)
Medication Instructions:  Your physician has recommended you make the following change in your medication:  1- START Ranolazine 500 mg by mouth two times a day.   If you need a refill on your cardiac medications before your next appointment, please call your pharmacy.   Lab work: - None ordered.  If you have labs (blood work) drawn today and your tests are completely normal, you will receive your results only by: Lucas Caldwell MyChart Message (if you have MyChart) OR . A paper copy in the mail If you have any lab test that is abnormal or we need to change your treatment, we will call you to review the results.  Testing/Procedures: - None ordered.  Follow-Up: You have been referred to Pulmonology for shortness of breath. Someone will call you to schedule this.    At [facility], you and your health needs are our priority.  As part of our continuing mission to provide you with exceptional heart care, we have created designated Provider Care Teams.  These Care Teams include your primary Cardiologist (physician) and Advanced Practice Providers (APPs -  Physician Assistants and Nurse Practitioners) who all work together to provide you with the care you need, when you need it. You will need a follow up appointment in 2 months.  You may see [name], MD or one of the following Advanced Practice Providers on your designated Care Team:   [name], NP [name], PA-C . [name], PA-C

## 2018-11-11 NOTE — Progress Notes (Signed)
Follow-up Outpatient Visit Date: 11/11/2018  Primary Care Provider: [name], DO [address] [id number]  Chief Complaint: Shortness of breath  HPI:  Lucas Caldwell is a 85 y.o. year-old male with history of coronary artery disease, mixed ischemic and nonischemic cardiomyopathy, hypertension, hyperlipidemia, type 2 diabetes mellitus, paroxysmal atrial fibrillation, chronic kidney disease stage III, CML, iron deficiency anemia,GI bleed due to duodenal ulcers,BPH, and hypothyroidism, who presents for follow-up of coronary artery disease and ischemic cardiomyopathy.  He was last seen on 10/07/2018 by [name], NP, following catheterization in late June.  This revealed patent RCA stent.  There was moderate to severe LMCA and LAD disease, that was hemodynamically significant by iFR.  However, iFR pullback suggesting diffuse involvement with ostial LMCA stenosis contributing only mildly to the overall gradient (iFR was normal with wire in the LCx).  Decision was made to continue with medical therapy.  LVEF was noted to be worse by echo at that time (35 to 40%).  At his last visit, he reported some improvement in exertional dyspnea after escalation of isosorbide mononitrate from 30 mg daily to 60 mg daily.  Therefore, isosorbide mononitrate was further increased to 90 mg daily.  Today, Lucas Caldwell reports that the increased dose of isosorbide mononitrate has not made much difference.  He continues to have shortness of breath with minimal activity, which he believes is his anginal equivalent.  He sometimes wheezes albuterol inhaler with transient improvement in his dyspnea.  He has not had any chest pain other than irritation of his breasts when wearing a seatbelt.  He feels like his breasts have continued to enlarge and wonders if one of his medications is causing this.  He also mentions that it sometimes feels like his chest is vibrating.  This can last up to a minute and happens  randomly.  He does not feel as though the heart itself is beating rapidly, though he notes that his shortness of breath gets worse when this happens.  It first began about 3 months and has happened sporadically every few days to weeks since then.  He remains compliant with his medications including warfarin.  He has not had any significant bleeding.  --------------------------------------------------------------------------------------------------  Past Medical History:  Diagnosis Date   Arthritis    "probably in his legs" (08/15/2016)   Blind right eye    BPH (benign prostatic hyperplasia)    Breast asymmetry    Left breast is larger, present for several years.   CAD (coronary artery disease)    a. Cath in the late 90's - reportedly ok;  b. 2014 s/p stenting x 2 @ [facility]; c 08/19/16 Cath/PCI with DES -> RCA, plan to treat LM 70% medically. Seen by surgery and felt to be too high risk for CABG; d. 08/2018 Cath: LM 70 (iFR 0.86), LAD 20ost, 40p, 50/45m, D1 70, LCX patent stent, OM1 30, RCA patent stent, 33m ISR, 40d, RPDA 30. PCWP 8. CO/CI 4.0/2.1.   Chronic combined systolic (congestive) and diastolic (congestive) heart failure ([place])    a. Previously reduced EF-->50% by echo in 2012;  b. 06/2015 Echo: EF 50-55%  c. 07/2016 Echo: EF 45-50%; d. 12/2017 Echo: EF 55%, Gr1 DD, mild MR; e. 08/2018 Echo: EF 35-40%, mildly dil PA.   Chronic kidney disease (CKD), stage IV (severe) (HCC)    CML (chronic myelocytic leukemia) (HCC)    GERD (gastroesophageal reflux disease)    GIB (gastrointestinal bleeding)    a. 12/2017 3 unit  PRBC GIB in setting of coumadin-->Endo/colon multiple duodenal ulcers and a single bleeding ulcer in the proximal ascending colon status post hemostatic clipping x2.   Gout    Hyperlipemia    Hypertension    Hypothyroidism    Iron deficiency anemia    Ischemic cardiomyopathy    a. Previously reduced EF-->50% by echo in 2012;  b. 06/2015 Echo: EF 50-55%, Gr2 DD, mild MR,  mildly dil LA, Ao sclerosis, mild TR;  c. 07/2016 Echo: EF 45-50%; d. 12/2017 Echo: EF 55%, Gr1 DD, mild MR; e. 08/2018 Echo: EF 35-40%.   Migraine    "in the 1960s" (08/15/2016)   PAF (paroxysmal atrial fibrillation) (HCC)    a. ? Dx 2014-->s/p DCCV;  b. CHA2DS2VASc = 6--> Coumadin.   Prostate cancer ([place])    RBBB    Type II diabetes mellitus ([place])    Past Surgical History:  Procedure Laterality Date   CATARACT EXTRACTION W/ INTRAOCULAR LENS  IMPLANT, BILATERAL Bilateral    COLONOSCOPY N/A 01/13/2018   Procedure: COLONOSCOPY;  Surgeon: Lin Landsman, MD;  Location: [facility] ENDOSCOPY;  Service: Gastroenterology;  Laterality: N/A;   COLONOSCOPY WITH PROPOFOL N/A 01/01/2018   Procedure: COLONOSCOPY WITH PROPOFOL;  Surgeon: Lin Landsman, MD;  Location: [facility] ENDOSCOPY;  Service: Gastroenterology;  Laterality: N/A;   CORONARY ANGIOPLASTY WITH STENT PLACEMENT  09/2012   2 stents   CORONARY STENT INTERVENTION N/A 08/19/2016   Procedure: Coronary Stent Intervention;  Surgeon: [name], MD;  Location: [place] [facility];  Service: Cardiovascular;  Laterality: N/A;   ESOPHAGEAL MANOMETRY N/A 12/16/2017   Procedure: ESOPHAGEAL MANOMETRY (EM);  Surgeon: Lin Landsman, MD;  Location: [facility] ENDOSCOPY;  Service: Gastroenterology;  Laterality: N/A;   ESOPHAGOGASTRODUODENOSCOPY N/A 12/20/2016   Procedure: ESOPHAGOGASTRODUODENOSCOPY (EGD);  Surgeon: Lin Landsman, MD;  Location: [facility] ENDOSCOPY;  Service: Gastroenterology;  Laterality: N/A;   ESOPHAGOGASTRODUODENOSCOPY N/A 01/13/2018   Procedure: ESOPHAGOGASTRODUODENOSCOPY (EGD);  Surgeon: Lin Landsman, MD;  Location[facility] ENDOSCOPY;  Service: Gastroenterology;  Laterality: N/A;   ESOPHAGOGASTRODUODENOSCOPY (EGD) WITH PROPOFOL N/A 11/03/2017   Procedure: ESOPHAGOGASTRODUODENOSCOPY (EGD) WITH PROPOFOL;  Surgeon: Lin Landsman, MD;  Location: [facility] ENDOSCOPY;  Service: Gastroenterology;  Laterality: N/A;   EYE  SURGERY     HERNIA REPAIR     "navel"   LEFT HEART CATH AND CORONARY ANGIOGRAPHY N/A 08/14/2016   Procedure: Left Heart Cath and Coronary Angiography;  Surgeon: [name], MD;  Location: [facility];  Service: Cardiovascular;  Laterality: N/A;   RIGHT/LEFT HEART CATH AND CORONARY ANGIOGRAPHY N/A 09/01/2018   Procedure: RIGHT/LEFT HEART CATH AND CORONARY ANGIOGRAPHY;  Surgeon: [name], MD;  Location: [place] [facility];  Service: Cardiovascular;  Laterality: N/A;   TOE AMPUTATION Right    "big toe"    Current Meds  Medication Sig   acetaminophen (TYLENOL) 500 MG tablet Take 1,000 mg by mouth every 6 (six) hours as needed for moderate pain or headache.    Alcohol Swabs 70 % PADS Use when checking blood sugar up to 3x daily   allopurinol (ZYLOPRIM) 100 MG tablet TAKE 1 TABLET EVERY DAY   Blood Glucose Monitoring Suppl (TRUE METRIX AIR GLUCOSE METER) DEVI 1 Device by Does not apply route 2 (two) times daily.   bosutinib (BOSULIF) 100 MG tablet Take 100 mg by mouth at bedtime as needed (tired/not feeling well). Take with food.    Dulaglutide (TRULICITY) 5.57 DU/2.0UR SOPN Inject 0.75 mg into the skin once a week. (Patient taking differently:  Inject 0.75 mg into the skin every Saturday. )   enalapril (VASOTEC) 5 MG tablet TAKE ONE TABLET BY MOUTH EVERY DAY   gabapentin (NEURONTIN) 300 MG capsule Take 2 capsules (600 mg total) by mouth 2 (two) times daily. Max dose = 1200mg  daily for kidney function   glucose blood (TRUE METRIX BLOOD GLUCOSE TEST) test strip Use as instructed   Insulin Glargine ([name] KWIKPEN) 100 UNIT/ML SOPN Inject 60 Units into the skin daily.   Insulin Pen Needle 31G X 5 MM MISC 31 g by Does not apply route as directed.   isosorbide mononitrate (IMDUR) 60 MG 24 hr tablet Take 1.5 tablets (90 mg total) by mouth daily.   levothyroxine (SYNTHROID) 25 MCG tablet TAKE 1 TABLET (25 MCG TOTAL) BY MOUTH DAILY BEFORE BREAKFAST.   loperamide  (IMODIUM A-D) 2 MG tablet Take 2 mg by mouth 3 (three) times daily as needed for diarrhea or loose stools.    Menthol-Zinc Oxide (GOLD BOND EX) Apply 1 application topically daily as needed (muscle pain).   metoprolol succinate (TOPROL XL) 25 MG 24 hr tablet Take 1 tablet (25 mg total) by mouth daily.   Multiple Vitamins-Minerals (MULTIVITAMIN ADULTS 50+ PO) Take 1 tablet by mouth every morning.   nitroGLYCERIN (NITROSTAT) 0.4 MG SL tablet Place 1 tablet (0.4 mg total) under the tongue every 5 (five) minutes x 3 doses as needed for chest pain.   omeprazole (PRILOSEC) 40 MG capsule Take 1 capsule (40 mg total) by mouth 2 (two) times daily.   Polyethyl Glycol-Propyl Glycol (SYSTANE OP) Place 1 drop into both eyes daily as needed (dry eyes).   PROVENTIL HFA 108 (90 Base) MCG/ACT inhaler Inhale 2 puffs into the lungs every 6 (six) hours as needed for wheezing or shortness of breath.   torsemide (DEMADEX) 20 MG tablet Take 1 tablet (20 mg total) by mouth 2 (two) times daily.   warfarin (COUMADIN) 3 MG tablet Take 1 tablet (3 mg total) by mouth daily. Take as directed by the coumadin clinic. (Patient taking differently: Take 3-4.5 mg by mouth See admin instructions. Take 3 mg at night on Sun, Tue, Wed, Thur, and Sat.  Take 4.5 mg at night on Mon and Fri.)    Allergies: Clopidogrel, Ciprofloxacin, Mirtazapine, and Spironolactone  Social History   Tobacco Use   Smoking status: Former Smoker    Packs/day: 2.00    Years: 15.00    Pack years: 30.00    Types: Cigarettes   Smokeless tobacco: Former Systems developer   Tobacco comment: quit 1979  Substance Use Topics   Alcohol use: No    Comment: previously drank heavily - quit 1979.   Drug use: No    Family History  Problem Relation Age of Onset   Brain cancer Father    Diabetes Sister    Heart attack Sister     Review of Systems: A 12-system review of systems was performed and was negative except as noted in the  HPI.  --------------------------------------------------------------------------------------------------  Physical Exam: BP 120/80 (BP Location: Left Arm, Patient Position: Sitting, Cuff Size: Normal)    Pulse 89    Ht 5\' 4"  (1.626 m)    Wt 202 lb 8 oz (91.9 kg)    BMI 34.76 kg/m   General: NAD.  Accompanied by his wife. HEENT: No conjunctival pallor or scleral icterus.  Facemask in place. Neck: Supple without lymphadenopathy, thyromegaly, JVD, or HJR. Lungs: Normal work of breathing. Clear to auscultation bilaterally without wheezes or crackles. Heart: Regular  rate and rhythm without murmurs, rubs, or gallops. Non-displaced PMI. Abd: Bowel sounds present. Soft, NT/ND without hepatosplenomegaly Ext: No lower extremity edema. Skin: Warm and dry without rash.  EKG: Normal sinus rhythm with right bundle branch block and left anterior fascicular block.  Borderline LVH.  No significant change since 10/07/2018.  Lab Results  Component Value Date   WBC 7.9 08/28/2018   HGB 12.6 (L) 08/28/2018   HCT 40.7 08/28/2018   MCV 85.3 08/28/2018   PLT 199 08/28/2018    Lab Results  Component Value Date   NA 136 10/09/2018   K 4.0 10/09/2018   CL 101 10/09/2018   CO2 24 10/09/2018   BUN 24 (H) 10/09/2018   CREATININE 1.25 (H) 10/09/2018   GLUCOSE 166 (H) 10/09/2018   ALT 14 08/28/2018    Lab Results  Component Value Date   CHOL 110 08/28/2018   HDL 28 (L) 08/28/2018   LDLCALC 51 08/28/2018   TRIG 155 (H) 08/28/2018   CHOLHDL 3.9 08/28/2018    --------------------------------------------------------------------------------------------------  ASSESSMENT AND PLAN: Coronary artery disease with unstable angina and shortness of breath: Lucas Caldwell does not report any frank chest pain but has continued shortness of breath that is maybe been a little worse than at prior visits.  He believes this is his anginal equivalent and is concerned that his quality of life continues to decline.  We  have again reviewed his coronary angiograms today in detail, noting that he has multifocal disease involving the ostial LMCA and LAD.  The most severe stenosis lies in the mid LAD, though the significant drop noted by iFR in June is likely due to the diffuse nature of the disease.  He has not responded well to escalation of isosorbide mononitrate.  We discussed the potential for alternative etiologies of his shortness of breath, including underlying pulmonary disease, and have agreed to begin with a pulmonary consultation first.  We will also add ranolazine 500 mg twice daily to see if this offers improvement.  If dyspnea persists or worsens despite pulmonary evaluation and addition of ranolazine, we will plan to perform PCI to the mid LAD, though I have advised Lucas Caldwell that this may be of limited benefit.  I do not believe that treatment of his ostial LMCA lesion is indicated.  Of note, he was previously turned down for CABG due to comorbidities.  Elevated risk for bleeding was discussed, given that the patient will need to be on warfarin and ticagrelor for at least 3 to 6 months (he has a history of clopidogrel allergy).  We will continue metoprolol succinate and rosuvastatin for secondary prevention.  Paroxysmal atrial fibrillation: EKG today again shows sinus rhythm.  Lucas Caldwell reports a "vibrating" sensation in his chest over the last 3 months.  I wonder if he is having paroxysms of atrial fibrillation or some other arrhythmia.  We will need to consider repeating an event monitor in the future, but will defer this pending aforementioned work-up.  Chronic systolic heart failure: Lucas Caldwell appears euvolemic on exam but complains of continued shortness of breath continued with NYHA class III heart failure.  It is possible that underlying lung disease and/or coronary insufficiency are also contributing.  We will continue current doses of metoprolol succinate, enalapril, and  torsemide.  Gynecomastia: Previously, the patient experienced this with spironolactone.  However, he feels that it is worsening despite being off spironolactone for some time.  His cardiac medications are otherwise not a clear cause for this.  I encouraged him to speak with his oncologist at [facility] to see if bosutinib could be a cause of this.  Follow-up: Return to clinic in 2 months.  Greater than 40 minutes were spend face-to-face with the patient, of which more than 50% of the time was spent reviewing recent cardiac catheterization and coordinating care.  [name], MD 11/11/2018 3:11 PM

## 2018-11-12 ENCOUNTER — Encounter: Payer: Self-pay | Admitting: Internal Medicine

## 2018-11-18 ENCOUNTER — Ambulatory Visit: Admit: 2018-11-18 | Discharge: 2018-11-19 | Payer: MEDICARE

## 2018-11-18 DIAGNOSIS — C9210 Chronic myeloid leukemia, BCR/ABL-positive, not having achieved remission: Secondary | ICD-10-CM

## 2018-11-24 ENCOUNTER — Ambulatory Visit (INDEPENDENT_AMBULATORY_CARE_PROVIDER_SITE_OTHER): Payer: Medicare HMO

## 2018-11-24 ENCOUNTER — Ambulatory Visit: Payer: Medicare HMO | Admitting: Pharmacist

## 2018-11-24 DIAGNOSIS — IMO0002 Reserved for concepts with insufficient information to code with codable children: Secondary | ICD-10-CM

## 2018-11-24 DIAGNOSIS — Z0000 Encounter for general adult medical examination without abnormal findings: Secondary | ICD-10-CM | POA: Diagnosis not present

## 2018-11-24 DIAGNOSIS — R0602 Shortness of breath: Secondary | ICD-10-CM

## 2018-11-24 DIAGNOSIS — E1121 Type 2 diabetes mellitus with diabetic nephropathy: Secondary | ICD-10-CM

## 2018-11-24 NOTE — Progress Notes (Signed)
Subjective:   Lucas Caldwell is a 85 y.o. male who presents for Medicare Annual/Subsequent preventive examination.  This visit is being conducted via phone call  - after an attmept to do on video chat - due to the COVID-19 pandemic. This patient has given me verbal consent via phone to conduct this visit, patient states they are participating from their home address. Some vital signs may be absent or patient reported.   Patient identification: identified by name, DOB, and current address.    Review of Systems:   Cardiac Risk Factors include: advanced age (>48men, >50 women);male gender;dyslipidemia;diabetes mellitus;hypertension;obesity (BMI >30kg/m2)     Objective:    Vitals: There were no vitals taken for this visit.  There is no height or weight on file to calculate BMI.  Advanced Directives 11/24/2018 09/01/2018 01/13/2018 01/11/2018 01/11/2018 01/01/2018 11/18/2017  Does Patient Have a Medical Advance Directive? No Yes No No Yes Yes No  Type of Advance Directive - - - - Living will Living will -  Does patient want to make changes to medical advance directive? - No - Patient declined - - - - -  Would patient like information on creating a medical advance directive? - - - No - Patient declined - - Yes (MAU/Ambulatory/Procedural Areas - Information given)    Tobacco Social History   Tobacco Use  Smoking Status Former Smoker  . Packs/day: 2.00  . Years: 15.00  . Pack years: 30.00  . Types: Cigarettes  Smokeless Tobacco Former Systems developer  Tobacco Comment   quit 1979     Counseling given: Not Answered Comment: quit 1979   Clinical Intake:  Pre-visit preparation completed: Yes  Pain : No/denies pain     Nutritional Risks: None Diabetes: Yes CBG done?: No Did pt. bring in CBG monitor from home?: No  How often do you need to have someone help you when you read instructions, pamphlets, or other written materials from your doctor or pharmacy?: 1 - Never  Nutrition Risk  Assessment:  Has the patient had any N/V/D within the last 2 months?  No  Does the patient have any non-healing wounds?  No  Has the patient had any unintentional weight loss or weight gain?  No   Diabetes:  Is the patient diabetic?  Yes  If diabetic, was a CBG obtained today?  No  Did the patient bring in their glucometer from home?  No  How often do you monitor your CBG's? every morning.   Financial Strains and Diabetes Management:  Are you having any financial strains with the device, your supplies or your medication? No .  Does the patient want to be seen by Chronic Care Management for management of their diabetes?  yes Would the patient like to be referred to a Nutritionist or for Diabetic Management?  No    Already in contact with CCM team   Diabetic Exams:  Diabetic Eye Exam: Completed 09/08/2017.  Diabetic Foot Exam: Completed 08/24/2018.   Interpreter Needed?: No  Information entered by :: Mitch Arquette,LPN  Past Medical History:  Diagnosis Date  . Arthritis    "probably in his legs" (08/15/2016)  . Blind right eye   . BPH (benign prostatic hyperplasia)   . Breast asymmetry    Left breast is larger, present for several years.  [name] CAD (coronary artery disease)    a. Cath in the late 90's - reportedly ok;  b. 2014 s/p stenting x 2 @ [facility]; c 08/19/16 Cath/PCI with DES ->  RCA, plan to treat LM 70% medically. Seen by surgery and felt to be too high risk for CABG; d. 08/2018 Cath: LM 70 (iFR 0.86), LAD 20ost, 40p, 50/57m, D1 70, LCX patent stent, OM1 30, RCA patent stent, 52m ISR, 40d, RPDA 30. PCWP 8. CO/CI 4.0/2.1.  [name] Chronic combined systolic (congestive) and diastolic (congestive) heart failure ([place])    a. Previously reduced EF-->50% by echo in 2012;  b. 06/2015 Echo: EF 50-55%  c. 07/2016 Echo: EF 45-50%; d. 12/2017 Echo: EF 55%, Gr1 DD, mild MR; e. 08/2018 Echo: EF 35-40%, mildly dil PA.  [name] Chronic kidney disease (CKD), stage IV (severe) ([place])   . CML (chronic myelocytic  leukemia) ([place])   . GERD (gastroesophageal reflux disease)   . GIB (gastrointestinal bleeding)    a. 12/2017 3 unit PRBC GIB in setting of coumadin-->Endo/colon multiple duodenal ulcers and a single bleeding ulcer in the proximal ascending colon status post hemostatic clipping x2.  . Gout   . Hyperlipemia   . Hypertension   . Hypothyroidism   . Iron deficiency anemia   . Ischemic cardiomyopathy    a. Previously reduced EF-->50% by echo in 2012;  b. 06/2015 Echo: EF 50-55%, Gr2 DD, mild MR, mildly dil LA, Ao sclerosis, mild TR;  c. 07/2016 Echo: EF 45-50%; d. 12/2017 Echo: EF 55%, Gr1 DD, mild MR; e. 08/2018 Echo: EF 35-40%.  . Migraine    "in the 1960s" (08/15/2016)  . PAF (paroxysmal atrial fibrillation) ([facility])    a. ? Dx 2014-->s/p DCCV;  b. CHA2DS2VASc = 6--> Coumadin.  . Prostate cancer ([place])   . RBBB   . Type II diabetes mellitus ([place])    Past Surgical History:  Procedure Laterality Date  . CATARACT EXTRACTION W/ INTRAOCULAR LENS  IMPLANT, BILATERAL Bilateral   . COLONOSCOPY N/A 01/13/2018   Procedure: COLONOSCOPY;  Surgeon: Lin Landsman, MD;  Location: [facility] ENDOSCOPY;  Service: Gastroenterology;  Laterality: N/A;  . COLONOSCOPY WITH PROPOFOL N/A 01/01/2018   Procedure: COLONOSCOPY WITH PROPOFOL;  Surgeon: Lin Landsman, MD;  Location: [facility] ENDOSCOPY;  Service: Gastroenterology;  Laterality: N/A;  . CORONARY ANGIOPLASTY WITH STENT PLACEMENT  09/2012   2 stents  . CORONARY STENT INTERVENTION N/A 08/19/2016   Procedure: Coronary Stent Intervention;  Surgeon: [name], MD;  Location: [place] CV LAB;  Service: Cardiovascular;  Laterality: N/A;  . ESOPHAGEAL MANOMETRY N/A 12/16/2017   Procedure: ESOPHAGEAL MANOMETRY (EM);  Surgeon: Lin Landsman, MD;  Location: [facility] ENDOSCOPY;  Service: Gastroenterology;  Laterality: N/A;  . ESOPHAGOGASTRODUODENOSCOPY N/A 12/20/2016   Procedure: ESOPHAGOGASTRODUODENOSCOPY (EGD);  Surgeon: Lin Landsman, MD;  Location: [facility]  ENDOSCOPY;  Service: Gastroenterology;  Laterality: N/A;  . ESOPHAGOGASTRODUODENOSCOPY N/A 01/13/2018   Procedure: ESOPHAGOGASTRODUODENOSCOPY (EGD);  Surgeon: Lin Landsman, MD;  Location: [facility] ENDOSCOPY;  Service: Gastroenterology;  Laterality: N/A;  . ESOPHAGOGASTRODUODENOSCOPY (EGD) WITH PROPOFOL N/A 11/03/2017   Procedure: ESOPHAGOGASTRODUODENOSCOPY (EGD) WITH PROPOFOL;  Surgeon: Lin Landsman, MD;  Location: [facility] ENDOSCOPY;  Service: Gastroenterology;  Laterality: N/A;  . EYE SURGERY    . HERNIA REPAIR     "navel"  . LEFT HEART CATH AND CORONARY ANGIOGRAPHY N/A 08/14/2016   Procedure: Left Heart Cath and Coronary Angiography;  Surgeon: [name], MD;  Location: [place] CV LAB;  Service: Cardiovascular;  Laterality: N/A;  . RIGHT/LEFT HEART CATH AND CORONARY ANGIOGRAPHY N/A 09/01/2018   Procedure: RIGHT/LEFT HEART CATH AND CORONARY ANGIOGRAPHY;  Surgeon: [name], MD;  Location: [place] CV LAB;  Service:  Cardiovascular;  Laterality: N/A;  . TOE AMPUTATION Right    "big toe"   Family History  Problem Relation Age of Onset  . Brain cancer Father   . Diabetes Sister   . Heart attack Sister    Social History   Socioeconomic History  . Marital status: Married    Spouse name: Not on file  . Number of children: Not on file  . Years of education: Not on file  . Highest education level: 7th grade  Occupational History  . Occupation: Retired    Comment: Owned his own [employer].  Social Needs  . Financial resource strain: Hard  . Food insecurity    Worry: Sometimes true    Inability: Never true  . Transportation needs    Medical: No    Non-medical: No  Tobacco Use  . Smoking status: Former Smoker    Packs/day: 2.00    Years: 15.00    Pack years: 30.00    Types: Cigarettes  . Smokeless tobacco: Former Systems developer  . Tobacco comment: quit 1979  Substance and Sexual Activity  . Alcohol use: No    Comment: previously drank heavily - quit  1979.  . Drug use: No  . Sexual activity: Never  Lifestyle  . Physical activity    Days per week: 0 days    Minutes per session: 0 min  . Stress: Not at all  Relationships  . Social connections    Talks on phone: More than three times a week    Gets together: More than three times a week    Attends religious service: More than 4 times per year    Active member of club or organization: No    Attends meetings of clubs or organizations: Never    Relationship status: Married  Other Topics Concern  . Not on file  Social History Narrative   Working on transportation truck part time     Outpatient Encounter Medications as of 11/24/2018  Medication Sig  . acetaminophen (TYLENOL) 500 MG tablet Take 1,000 mg by mouth every 6 (six) hours as needed for moderate pain or headache.   . Alcohol Swabs 70 % PADS Use when checking blood sugar up to 3x daily  . allopurinol (ZYLOPRIM) 100 MG tablet TAKE 1 TABLET EVERY DAY  . Blood Glucose Monitoring Suppl (TRUE METRIX AIR GLUCOSE METER) DEVI 1 Device by Does not apply route 2 (two) times daily.  . bosutinib (BOSULIF) 100 MG tablet Take 100 mg by mouth at bedtime as needed (tired/not feeling well). Take with food.   . Dulaglutide (TRULICITY) 5.17 OH/6.0VP SOPN Inject 0.75 mg into the skin once a week. (Patient taking differently: Inject 0.75 mg into the skin every Saturday. )  . enalapril (VASOTEC) 5 MG tablet TAKE ONE TABLET BY MOUTH EVERY DAY  . gabapentin (NEURONTIN) 300 MG capsule Take 2 capsules (600 mg total) by mouth 2 (two) times daily. Max dose = 1200mg  daily for kidney function  . glucose blood (TRUE METRIX BLOOD GLUCOSE TEST) test strip Use as instructed  . Insulin Glargine ([name] KWIKPEN) 100 UNIT/ML SOPN Inject 60 Units into the skin daily.  . Insulin Pen Needle 31G X 5 MM MISC 31 g by Does not apply route as directed.  . loperamide (IMODIUM A-D) 2 MG tablet Take 2 mg by mouth 3 (three) times daily as needed for diarrhea or loose stools.    . Menthol-Zinc Oxide (GOLD BOND EX) Apply 1 application topically daily as needed (muscle pain).  [name]  metoprolol succinate (TOPROL XL) 25 MG 24 hr tablet Take 1 tablet (25 mg total) by mouth daily.  . Multiple Vitamins-Minerals (MULTIVITAMIN ADULTS 50+ PO) Take 1 tablet by mouth every morning.  [name] omeprazole (PRILOSEC) 40 MG capsule Take 1 capsule (40 mg total) by mouth 2 (two) times daily.  [name] Place 1 drop into both eyes daily as needed (dry eyes).  [name] PROVENTIL HFA 108 (90 Base) MCG/ACT inhaler Inhale 2 puffs into the lungs every 6 (six) hours as needed for wheezing or shortness of breath.  . ranolazine (RANEXA) 500 MG 12 hr tablet Take 1 tablet (500 mg total) by mouth 2 (two) times daily.  . rosuvastatin (CRESTOR) 5 MG tablet Take 1 tablet (5 mg total) by mouth daily. (Patient taking differently: Take 5 mg by mouth at bedtime. )  . torsemide (DEMADEX) 20 MG tablet Take 1 tablet (20 mg total) by mouth 2 (two) times daily.  [name] warfarin (COUMADIN) 3 MG tablet Take 1 tablet (3 mg total) by mouth daily. Take as directed by the coumadin clinic. (Patient taking differently: Take 3-4.5 mg by mouth See admin instructions. Take 3 mg at night on Sun, Tue, Wed, Thur, and Sat.  Take 4.5 mg at night on Mon and Fri.)  . levothyroxine (SYNTHROID) 25 MCG tablet TAKE 1 TABLET (25 MCG TOTAL) BY MOUTH DAILY BEFORE BREAKFAST. (Patient not taking: Reported on 11/24/2018)  . nitroGLYCERIN (NITROSTAT) 0.4 MG SL tablet Place 1 tablet (0.4 mg total) under the tongue every 5 (five) minutes x 3 doses as needed for chest pain. (Patient not taking: Reported on 11/24/2018)  . [DISCONTINUED] isosorbide mononitrate (IMDUR) 60 MG 24 hr tablet Take 1.5 tablets (90 mg total) by mouth daily. (Patient not taking: Reported on 11/24/2018)   No facility-administered encounter medications on file as of 11/24/2018.     Activities of Daily Living In your present state of health, do you have any difficulty  performing the following activities: 11/24/2018 09/01/2018  Hearing? Y N  Comment no hearig aids -  Vision? N N  Comment glasses, dr.woodard -  Difficulty concentrating or making decisions? N N  Walking or climbing stairs? N Y  Dressing or bathing? N N  Doing errands, shopping? N -  Preparing Food and eating ? N -  Using the Toilet? N -  In the past six months, have you accidently leaked urine? N -  Do you have problems with loss of bowel control? N -  Managing your Medications? N -  Managing your Finances? N -  Housekeeping or managing your Housekeeping? N -  Some recent data might be hidden    Patient Care Team: [name], DO as PCP - General (Family Medicine) End, [name], MD as PCP - Cardiology (Cardiology) [place], [name], [facility] as Pharmacist   Assessment:   This is a routine wellness examination for Gwyndolyn Saxon.  Exercise Activities and Dietary recommendations Current Exercise Habits: The patient does not participate in regular exercise at present, Exercise limited by: None identified  Goals    . DIET - INCREASE WATER INTAKE     Recommend drinking at least 6-8 glasses of water a day     . Medication Managment     Current Barriers:  . Financial . Limited vision  Pharmacist Clinical Goal(s):  [name] Over the next 30 days, patient will work with CM Pharmacist to address needs related to medication adherence and diabetes management  Interventions: . Collaborate with [facility] CPhT .  Perform chart review o Mr. Imai seen at [facility] yesterday - patient had supratherapuetic INR of 3.2. . Counsel on importance of medication adherence o Mrs. Latner confirms that patient held warfarin dose yesterday as instructed by Anti-coag provider and is following warfarin dosing instructions as provided on printed calendar from visit yesterday . Counsel on importance of blood pressure monitoring, daily weight monitoring monitoring and follow up with Cardiologist .  Counsel on importance of blood sugar control and monitoring o Review recent blood sugar results (see below) o Denies any low blood sugars o Per [facility] CPhT, patient had problems with a defective [name] pen. Reiterate CPhT's advice to patient to follow up with [facility] when ordering refill of medication regarding how to obtain a replacement.  Patient Self Care Activities:  . Check blood sugar, blood pressure and daily weight as directed by providers  Date Fasting Blood Glucose  5 - August 128  6 - August 143  7 - August 155  8 - August  102  9 - August 136  10 - August 173  11 - August 130  12 - August 120  13 - August 145  Average 137   . Takes medications, with assistance of wife, as directed . Patient attends scheduled medical appointments o Next appointment with Cardiologist on 8/26  Please see past updates related to this goal by clicking on the "Past Updates" button in the selected goal         Fall Risk Fall Risk  11/24/2018 11/04/2018 08/24/2018 06/09/2018 06/01/2018  Falls in the past year? 0 0 0 0 0  Number falls in past yr: - - - - -  Injury with Fall? - - - - -  Follow up - Falls evaluation completed Falls evaluation completed Falls evaluation completed Falls evaluation completed   [place]:  Any stairs in or around the home? No  If so, are there any without handrails? No   Home free of loose throw rugs in walkways, pet beds, electrical cords, etc? Yes  Adequate lighting in your home to reduce risk of falls? Yes   ASSISTIVE DEVICES UTILIZED TO PREVENT FALLS:  Life alert? No  Use of a cane, walker or w/c? Yes  Grab bars in the bathroom? Yes  Shower chair or bench in shower? Yes  Elevated toilet seat or a handicapped toilet? Yes    TIMED UP AND GO:  Unable to perform    Depression Screen PHQ 2/9 Scores 11/24/2018 11/04/2018 08/24/2018 06/09/2018  PHQ - 2 Score 0 0 0 0    Cognitive Function MMSE - Mini Mental State Exam  11/18/2017  Orientation to time 5  Orientation to Place 5  Registration 3  Attention/ Calculation 5  Recall 2  Language- name 2 objects 2  Language- repeat 1  Language- follow 3 step command 3  Language- read & follow direction 1  Write a sentence 1  Copy design 0  Total score 28        Immunization History  Administered Date(s) Administered  . Influenza, High Dose Seasonal PF 12/29/2014, 12/19/2016, 12/22/2017  . Influenza, Seasonal, Injecte, Preservative Fre 01/13/2007, 12/13/2008, 01/30/2010, 12/26/2011  . Influenza,inj,Quad PF,6+ Mos 12/25/2012, 12/09/2015  . PPD Test 09/25/2010  . Pneumococcal Conjugate-13 01/31/2014  . Pneumococcal Polysaccharide-23 06/20/2008, 09/02/2010    Qualifies for Shingles Vaccine? Yes  Zostavax completed n/a. Due for Shingrix. Education has been provided regarding the importance of this vaccine. Pt has been advised  to call insurance company to determine out of pocket expense. Advised may also receive vaccine at local pharmacy or Health Dept. Verbalized acceptance and understanding.  Tdap: Discussed need for TD/TDAP vaccine, patient verbalized understanding that this is not covered as a preventative with there insurance and to call the office if he develops any new skin injuries, ie: cuts, scrapes, bug bites, or open wounds.  Flu Vaccine: Due for Flu vaccine.   Pneumococcal Vaccine: up to date  Screening Tests Health Maintenance  Topic Date Due  . TETANUS/TDAP  11/15/1952  . OPHTHALMOLOGY EXAM  09/09/2018  . INFLUENZA VACCINE  10/17/2018  . HEMOGLOBIN A1C  02/23/2019  . FOOT EXAM  08/24/2019  . PNA vac Low Risk Adult  Completed   Cancer Screenings:  Colorectal Screening: no longer required  Lung Cancer Screening: (Low Dose CT Chest recommended if Age 51-80 years, 30 pack-year currently smoking OR have quit w/in 15years.) does not qualify.    Additional Screening:  Hepatitis C Screening: does not qualify  Dental Screening:  Recommended annual dental exams for proper oral hygiene  Community Resource Referral:  CRR required this visit?  No        Plan:  I have personally reviewed and addressed the Medicare Annual Wellness questionnaire and have noted the following in the patient's chart:  A. Medical and social history B. Use of alcohol, tobacco or illicit drugs  C. Current medications and supplements D. Functional ability and status E.  Nutritional status F.  Physical activity G. Advance directives H. List of other physicians I.  Hospitalizations, surgeries, and ER visits in previous 12 months J.  [place] such as hearing and vision if needed, cognitive and depression L. Referrals and appointments   In addition, I have reviewed and discussed with patient certain preventive protocols, quality metrics, and best practice recommendations. A written personalized care plan for preventive services as well as general preventive health recommendations were provided to patient.   Signed,   Bevelyn Ngo, LPN  09/23/7280 Nurse Health Advisor  Nurse Notes:  Patient requesting new glucometer.

## 2018-11-24 NOTE — Patient Instructions (Addendum)
Thank you allowing the Chronic Care Management Team to be a part of your care! It was a pleasure speaking with you today!     CCM (Chronic Care Management) Team    Janci Minor RN, BSN Nurse Care Coordinator  [phone]   [name] PharmD  Clinical Pharmacist  [phone]   Eula Fried LCSW Clinical Social Worker [phone]  Visit Information  Goals Addressed            This Visit's Progress   . Medication Managment       Current Barriers:  . Financial . Limited vision  Pharmacist Clinical Goal(s):  [name] Over the next 30 days, patient will work with CM Pharmacist to address needs related to medication adherence and diabetes management  Interventions: . Perform chart review o Patient seen by Cardiologist on 8/26 - Started on ranolazine 500 mg twice daily - To have a consult with Pulmonologist regarding shortness of breath . Counsel on importance of medication adherence o Mrs. Oddo reports patient started ranolazine as directed by Cardiologist . Identify coordination of care issue - patient with overlapping Pulmonology and Oncology appointments.  o Provide Ms. Hester with phone numbers for provider offices o Mrs. Teachey to follow up . Counsel on importance of blood pressure monitoring and follow up with Cardiologist . Counsel on importance of blood sugar control and monitoring o Reports morning fasting CBGs running between 110-140 mg/dLs over past week. o Remind patient of importance of keeping up with [facility] for refills of patient assistance program medications  Patient Self Care Activities:  . Check blood sugar, blood pressure and daily weight as directed by providers  . Takes medications, with assistance of wife, as directed . Patient attends scheduled medical appointments  Please see past updates related to this goal by clicking on the "Past Updates" button in the selected goal         The patient verbalized understanding of  instructions provided today and declined a print copy of patient instruction materials.   The care management team will reach out to the patient again over the next 30 days.   [name], PharmD, [facility] [employer] [phone]

## 2018-11-24 NOTE — Patient Instructions (Addendum)
Lucas Caldwell , Thank you for taking time to come for your Medicare Wellness Visit. I appreciate your ongoing commitment to your health goals. Please review the following plan we discussed and let me know if I can assist you in the future.   Screening recommendations/referrals: Colonoscopy: no longer required Recommended yearly ophthalmology/optometry visit for glaucoma screening and checkup Recommended yearly dental visit for hygiene and checkup  Vaccinations: Influenza vaccine: due now Pneumococcal vaccine: up to date Tdap vaccine: due, check with your insurance for coverage  Shingles vaccine: shingrix eligible    Advanced directives: please pick up a copy of this information next time you are in the office.   Conditions/risks identified: diabetic- discussed chronic care management team.   Next appointment: Follow up in one year for your annual wellness visit  Preventive Care 65 Years and Older, Male Preventive care refers to lifestyle choices and visits with your health care provider that can promote health and wellness. What does preventive care include?  A yearly physical exam. This is also called an annual well check.  Dental exams once or twice a year.  Routine eye exams. Ask your health care provider how often you should have your eyes checked.  Personal lifestyle choices, including:  Daily care of your teeth and gums.  Regular physical activity.  Eating a healthy diet.  Avoiding tobacco and drug use.  Limiting alcohol use.  Practicing safe sex.  Taking low doses of aspirin every day.  Taking vitamin and mineral supplements as recommended by your health care provider. What happens during an annual well check? The services and screenings done by your health care provider during your annual well check will depend on your age, overall health, lifestyle risk factors, and family history of disease. Counseling  Your health care provider may ask you questions about  your:  Alcohol use.  Tobacco use.  Drug use.  Emotional well-being.  Home and relationship well-being.  Sexual activity.  Eating habits.  History of falls.  Memory and ability to understand (cognition).  Work and work Statistician. Screening  You may have the following tests or measurements:  Height, weight, and BMI.  Blood pressure.  Lipid and cholesterol levels. These may be checked every 5 years, or more frequently if you are over 26 years old.  Skin check.  Lung cancer screening. You may have this screening every year starting at age 40 if you have a 30-pack-year history of smoking and currently smoke or have quit within the past 15 years.  Fecal occult blood test (FOBT) of the stool. You may have this test every year starting at age 62.  Flexible sigmoidoscopy or colonoscopy. You may have a sigmoidoscopy every 5 years or a colonoscopy every 10 years starting at age 63.  Prostate cancer screening. Recommendations will vary depending on your family history and other risks.  Hepatitis C blood test.  Hepatitis B blood test.  Sexually transmitted disease (STD) testing.  Diabetes screening. This is done by checking your blood sugar (glucose) after you have not eaten for a while (fasting). You may have this done every 1-3 years.  Abdominal aortic aneurysm (AAA) screening. You may need this if you are a current or former smoker.  Osteoporosis. You may be screened starting at age 15 if you are at high risk. Talk with your health care provider about your test results, treatment options, and if necessary, the need for more tests. Vaccines  Your health care provider may recommend certain vaccines, such as:  Influenza vaccine. This is recommended every year.  Tetanus, diphtheria, and acellular pertussis (Tdap, Td) vaccine. You may need a Td booster every 10 years.  Zoster vaccine. You may need this after age 24.  Pneumococcal 13-valent conjugate (PCV13) vaccine.  One dose is recommended after age 9.  Pneumococcal polysaccharide (PPSV23) vaccine. One dose is recommended after age 72. Talk to your health care provider about which screenings and vaccines you need and how often you need them. This information is not intended to replace advice given to you by your health care provider. Make sure you discuss any questions you have with your health care provider. Document Released: 03/31/2015 Document Revised: 11/22/2015 Document Reviewed: 01/03/2015 Elsevier Interactive Patient Education  2017 [employer] Prevention in the Home Falls can cause injuries. They can happen to people of all ages. There are many things you can do to make your home safe and to help prevent falls. What can I do on the outside of my home?  Regularly fix the edges of walkways and driveways and fix any cracks.  Remove anything that might make you trip as you walk through a door, such as a raised step or threshold.  Trim any bushes or trees on the path to your home.  Use bright outdoor lighting.  Clear any walking paths of anything that might make someone trip, such as rocks or tools.  Regularly check to see if handrails are loose or broken. Make sure that both sides of any steps have handrails.  Any raised decks and porches should have guardrails on the edges.  Have any leaves, snow, or ice cleared regularly.  Use sand or salt on walking paths during winter.  Clean up any spills in your garage right away. This includes oil or grease spills. What can I do in the bathroom?  Use night lights.  Install grab bars by the toilet and in the tub and shower. Do not use towel bars as grab bars.  Use non-skid mats or decals in the tub or shower.  If you need to sit down in the shower, use a plastic, non-slip stool.  Keep the floor dry. Clean up any water that spills on the floor as soon as it happens.  Remove soap buildup in the tub or shower regularly.  Attach bath  mats securely with double-sided non-slip rug tape.  Do not have throw rugs and other things on the floor that can make you trip. What can I do in the bedroom?  Use night lights.  Make sure that you have a light by your bed that is easy to reach.  Do not use any sheets or blankets that are too big for your bed. They should not hang down onto the floor.  Have a firm chair that has side arms. You can use this for support while you get dressed.  Do not have throw rugs and other things on the floor that can make you trip. What can I do in the kitchen?  Clean up any spills right away.  Avoid walking on wet floors.  Keep items that you use a lot in easy-to-reach places.  If you need to reach something above you, use a strong step stool that has a grab bar.  Keep electrical cords out of the way.  Do not use floor polish or wax that makes floors slippery. If you must use wax, use non-skid floor wax.  Do not have throw rugs and other things on the floor that  can make you trip. What can I do with my stairs?  Do not leave any items on the stairs.  Make sure that there are handrails on both sides of the stairs and use them. Fix handrails that are broken or loose. Make sure that handrails are as long as the stairways.  Check any carpeting to make sure that it is firmly attached to the stairs. Fix any carpet that is loose or worn.  Avoid having throw rugs at the top or bottom of the stairs. If you do have throw rugs, attach them to the floor with carpet tape.  Make sure that you have a light switch at the top of the stairs and the bottom of the stairs. If you do not have them, ask someone to add them for you. What else can I do to help prevent falls?  Wear shoes that:  Do not have high heels.  Have rubber bottoms.  Are comfortable and fit you well.  Are closed at the toe. Do not wear sandals.  If you use a stepladder:  Make sure that it is fully opened. Do not climb a closed  stepladder.  Make sure that both sides of the stepladder are locked into place.  Ask someone to hold it for you, if possible.  Clearly mark and make sure that you can see:  Any grab bars or handrails.  First and last steps.  Where the edge of each step is.  Use tools that help you move around (mobility aids) if they are needed. These include:  Canes.  Walkers.  Scooters.  Crutches.  Turn on the lights when you go into a dark area. Replace any light bulbs as soon as they burn out.  Set up your furniture so you have a clear path. Avoid moving your furniture around.  If any of your floors are uneven, fix them.  If there are any pets around you, be aware of where they are.  Review your medicines with your doctor. Some medicines can make you feel dizzy. This can increase your chance of falling. Ask your doctor what other things that you can do to help prevent falls. This information is not intended to replace advice given to you by your health care provider. Make sure you discuss any questions you have with your health care provider. Document Released: 12/29/2008 Document Revised: 08/10/2015 Document Reviewed: 04/08/2014 Elsevier Interactive Patient Education  2017 [employer].

## 2018-11-24 NOTE — Chronic Care Management (AMB) (Signed)
Chronic Care Management   Follow Up Note   11/24/2018 Name: Lucas Caldwell MRN: [id number] DOB: 05-Aug-1933  Referred by: [name], DO Reason for referral : Chronic Care Management (Patient Phone Call)   Lucas Caldwell is a 85 y.o. year old male who is a primary care patient of [name], DO. The CCM team was consulted for assistance with chronic disease management and care coordination needs.  Lucas Caldwell has a past medical history including but not limited to type 2 diabetes, CKD, hyperlipidemia, COPD, atrial fibrillation, hypertension, coronary artery disease, Chronic HFpEF, hypothyroidism, and CML  I reached out to Lucas Caldwell by phone today.   Review of patient status, including review of consultants reports, relevant laboratory and other test results, and collaboration with appropriate care team members and the patient's provider was performed as part of comprehensive patient evaluation and provision of chronic care management services.     Outpatient Encounter Medications as of 11/24/2018  Medication Sig Note  . Dulaglutide (TRULICITY) 8.65 HQ/4.6NG SOPN Inject 0.75 mg into the skin once a week. (Patient taking differently: Inject 0.75 mg into the skin every Saturday. )   . gabapentin (NEURONTIN) 300 MG capsule Take 2 capsules (600 mg total) by mouth 2 (two) times daily. Max dose = 1200mg  daily for kidney function   . ranolazine (RANEXA) 500 MG 12 hr tablet Take 1 tablet (500 mg total) by mouth 2 (two) times daily.   [name] acetaminophen (TYLENOL) 500 MG tablet Take 1,000 mg by mouth every 6 (six) hours as needed for moderate pain or headache.    . Alcohol Swabs 70 % PADS Use when checking blood sugar up to 3x daily   . allopurinol (ZYLOPRIM) 100 MG tablet TAKE 1 TABLET EVERY DAY   . Blood Glucose Monitoring Suppl (TRUE METRIX AIR GLUCOSE METER) DEVI 1 Device by Does not apply route 2 (two) times daily.   . bosutinib (BOSULIF) 100 MG tablet Take 100  mg by mouth at bedtime as needed (tired/not feeling well). Take with food.  08/31/2018: Pt's wife states he only takes this as needed when he's "not feeling well"  . enalapril (VASOTEC) 5 MG tablet TAKE ONE TABLET BY MOUTH EVERY DAY   . glucose blood (TRUE METRIX BLOOD GLUCOSE TEST) test strip Use as instructed   . Insulin Glargine ([name] KWIKPEN) 100 UNIT/ML SOPN Inject 60 Units into the skin daily.   . Insulin Pen Needle 31G X 5 MM MISC 31 g by Does not apply route as directed.   [name] levothyroxine (SYNTHROID) 25 MCG tablet TAKE 1 TABLET (25 MCG TOTAL) BY MOUTH DAILY BEFORE BREAKFAST. (Patient not taking: Reported on 11/24/2018)   . loperamide (IMODIUM A-D) 2 MG tablet Take 2 mg by mouth 3 (three) times daily as needed for diarrhea or loose stools.    . Menthol-Zinc Oxide (GOLD BOND EX) Apply 1 application topically daily as needed (muscle pain).   . metoprolol succinate (TOPROL XL) 25 MG 24 hr tablet Take 1 tablet (25 mg total) by mouth daily.   . Multiple Vitamins-Minerals (MULTIVITAMIN ADULTS 50+ PO) Take 1 tablet by mouth every morning.   . nitroGLYCERIN (NITROSTAT) 0.4 MG SL tablet Place 1 tablet (0.4 mg total) under the tongue every 5 (five) minutes x 3 doses as needed for chest pain. (Patient not taking: Reported on 11/24/2018)   . omeprazole (PRILOSEC) 40 MG capsule Take 1 capsule (40 mg total) by mouth 2 (two) times daily.   Lucas Caldwell  Glycol-Propyl Glycol (SYSTANE OP) Place 1 drop into both eyes daily as needed (dry eyes).   [name] PROVENTIL HFA 108 (90 Base) MCG/ACT inhaler Inhale 2 puffs into the lungs every 6 (six) hours as needed for wheezing or shortness of breath.   . rosuvastatin (CRESTOR) 5 MG tablet Take 1 tablet (5 mg total) by mouth daily. (Patient taking differently: Take 5 mg by mouth at bedtime. )   . torsemide (DEMADEX) 20 MG tablet Take 1 tablet (20 mg total) by mouth 2 (two) times daily.   [name] warfarin (COUMADIN) 3 MG tablet Take 1 tablet (3 mg total) by mouth daily. Take as  directed by the coumadin clinic. (Patient taking differently: Take 3-4.5 mg by mouth See admin instructions. Take 3 mg at night on Sun, Tue, Wed, Thur, and Sat.  Take 4.5 mg at night on Mon and Fri.)   . [DISCONTINUED] isosorbide mononitrate (IMDUR) 60 MG 24 hr tablet Take 1.5 tablets (90 mg total) by mouth daily. (Patient not taking: Reported on 11/24/2018)    No facility-administered encounter medications on file as of 11/24/2018.     Goals Addressed            This Visit's Progress   . Medication Managment       Current Barriers:  . Financial . Limited vision  Pharmacist Clinical Goal(s):  [name] Over the next 30 days, patient will work with CM Pharmacist to address needs related to medication adherence and diabetes management  Interventions: . Perform chart review o Patient seen by Cardiologist on 8/26 - Started on ranolazine 500 mg twice daily - To have a consult with Pulmonologist regarding shortness of breath . Counsel on importance of medication adherence o Lucas Caldwell reports patient started ranolazine as directed by Cardiologist . Identify coordination of care issue - patient with overlapping Pulmonology and Oncology appointments.  o Provide Ms. Caldwell with phone numbers for provider offices o Lucas Caldwell to follow up . Counsel on importance of blood pressure monitoring and follow up with Cardiologist . Counsel on importance of blood sugar control and monitoring o Reports morning fasting CBGs running between 110-140 mg/dLs over past week. o Remind patient of importance of keeping up with [facility] for refills of patient assistance program medications  Patient Self Care Activities:  . Check blood sugar, blood pressure and daily weight as directed by providers  . Takes medications, with assistance of wife, as directed . Patient attends scheduled medical appointments  Please see past updates related to this goal by clicking on the "Past Updates" button in the selected  goal        Plan   The care management team will reach out to the patient again over the next 30 days.   Lucas Caldwell, PharmD, [place] [employer] [phone]

## 2018-11-25 ENCOUNTER — Ambulatory Visit: Payer: Self-pay | Admitting: Pharmacist

## 2018-11-25 ENCOUNTER — Telehealth: Payer: Self-pay | Admitting: Internal Medicine

## 2018-11-25 ENCOUNTER — Telehealth: Payer: Self-pay | Admitting: Family Medicine

## 2018-11-25 DIAGNOSIS — IMO0002 Reserved for concepts with insufficient information to code with codable children: Secondary | ICD-10-CM

## 2018-11-25 DIAGNOSIS — E1142 Type 2 diabetes mellitus with diabetic polyneuropathy: Secondary | ICD-10-CM

## 2018-11-25 DIAGNOSIS — I25119 Atherosclerotic heart disease of native coronary artery with unspecified angina pectoris: Secondary | ICD-10-CM

## 2018-11-25 DIAGNOSIS — E1121 Type 2 diabetes mellitus with diabetic nephropathy: Secondary | ICD-10-CM

## 2018-11-25 DIAGNOSIS — M170 Bilateral primary osteoarthritis of knee: Secondary | ICD-10-CM

## 2018-11-25 MED ORDER — TRAMADOL HCL 50 MG PO TABS: 50.0000 mg | ORAL_TABLET | Freq: Three times a day (TID) | ORAL | 2 refills | Status: DC | PRN

## 2018-11-25 MED ORDER — ACCU-CHEK AVIVA PLUS VI STRP: ORAL_STRIP | 3 refills | Status: DC

## 2018-11-25 MED ORDER — ACCU-CHEK SOFTCLIX LANCETS MISC: 3 refills | Status: DC

## 2018-11-25 MED ORDER — ACCU-CHEK AVIVA PLUS W/DEVICE KIT: PACK | 0 refills | Status: DC

## 2018-11-25 MED ORDER — ISOSORBIDE MONONITRATE ER 60 MG PO TB24: 90.0000 mg | ORAL_TABLET | Freq: Every day | ORAL | 3 refills | Status: DC

## 2018-11-25 NOTE — Telephone Encounter (Signed)
Call and spoke with patient and wife. They verbalized understanding the he should restart isosorbide 90 mg daily and still continue other medications. Med list updated.

## 2018-11-25 NOTE — Patient Instructions (Signed)
Thank you allowing the Chronic Care Management Team to be a part of your care! It was a pleasure speaking with you today!     CCM (Chronic Care Management) Team    Janci Minor RN, BSN Nurse Care Coordinator  [phone]   [name] PharmD  Clinical Pharmacist  [phone]   Eula Fried LCSW Clinical Social Worker [phone]  Visit Information  Goals Addressed            This Visit's Progress   . Medication Managment       Current Barriers:  . Financial . Limited vision  Pharmacist Clinical Goal(s):  [name] Over the next 30 days, patient will work with CM Pharmacist to address needs related to medication adherence and diabetes management  Interventions: . Collaboration with PCP - provider reports Mr. Galentine has requested a new glucometer . Follow up with Mr and Mrs. Lins regarding need for new glucometer. Patient reports that his current meter is working, but has started to have more errors recently (machine ~ 83 years old) o Note Accu-Chek [employer]iva Plus meter and supplies preferred option through patient's health plan . Follow up with Mr and Mrs. Mckone regarding patient's prescription for isosorbide. Mr and Mrs. Haselton state that they understood from patient's last visit with Dr. Saunders Revel, that Mr. Vandermeulen was to hold his isosorbide when starting the ranolazine o Let patient/wife know that I will call to follow up with Cardiologist to confirm and then request a call back to patient. . Coordination of care call to [facility]- speak with Lars Pinks, RN, to request clarification about whether patient was to stop/hold isosorbide . Will ask PCP to send prescriptions for Accu-Chek Aviva Plus meter and supplies to [facility] for patient.  Patient Self Care Activities:  . Check blood sugar, blood pressure and daily weight as directed by providers  . Takes medications, with assistance of wife, as directed . Patient attends scheduled  medical appointments  Please see past updates related to this goal by clicking on the "Past Updates" button in the selected goal         The patient verbalized understanding of instructions provided today and declined a print copy of patient instruction materials.   The care management team will reach out to the patient again over the next 14 days.   [name], PharmD, [place] [employer] [phone]

## 2018-11-25 NOTE — Telephone Encounter (Signed)
Done left detailed vm

## 2018-11-25 NOTE — Chronic Care Management (AMB) (Signed)
Chronic Care Management   Follow Up Note   11/25/2018 Name: LYMON KIDNEY MRN: [id number] DOB: [birth year]  Referred by: [name], DO Reason for referral : Chronic Care Management (Patient Phone Call) and Care Coordination [facility] [place])   FREDICK SCHLOSSER is a 85 y.o. year old male who is a primary care patient of [name], DO. The CCM team was consulted for assistance with chronic disease management and care coordination needs.  Mr. Pinkham has a past medical history including but not limited to type 2 diabetes, CKD, hyperlipidemia, COPD, atrial fibrillation, hypertension, coronary artery disease, Chronic HFpEF, hypothyroidism, and CML  I reached out to [name] by phone today.   Review of patient status, including review of consultants reports, relevant laboratory and other test results, and collaboration with appropriate care team members and the patient's provider was performed as part of comprehensive patient evaluation and provision of chronic care management services.     Outpatient Encounter Medications as of 11/25/2018  Medication Sig Note  . acetaminophen (TYLENOL) 500 MG tablet Take 1,000 mg by mouth every 6 (six) hours as needed for moderate pain or headache.    . Alcohol Swabs 70 % PADS Use when checking blood sugar up to 3x daily   . allopurinol (ZYLOPRIM) 100 MG tablet TAKE 1 TABLET EVERY DAY   . Blood Glucose Monitoring Suppl (TRUE METRIX AIR GLUCOSE METER) DEVI 1 Device by Does not apply route 2 (two) times daily.   . bosutinib (BOSULIF) 100 MG tablet Take 100 mg by mouth at bedtime as needed (tired/not feeling well). Take with food.  08/31/2018: Pt's wife states he only takes this as needed when he's "not feeling well"  . Dulaglutide (TRULICITY) 4.09 WJ/1.9JY SOPN Inject 0.75 mg into the skin once a week. (Patient taking differently: Inject 0.75 mg into the skin every Saturday. )   . enalapril (VASOTEC) 5 MG tablet  TAKE ONE TABLET BY MOUTH EVERY DAY   . gabapentin (NEURONTIN) 300 MG capsule Take 2 capsules (600 mg total) by mouth 2 (two) times daily. Max dose = 1200mg  daily for kidney function   . glucose blood (TRUE METRIX BLOOD GLUCOSE TEST) test strip Use as instructed   . Insulin Glargine ([name] KWIKPEN) 100 UNIT/ML SOPN Inject 60 Units into the skin daily.   . Insulin Pen Needle 31G X 5 MM MISC 31 g by Does not apply route as directed.   [name] levothyroxine (SYNTHROID) 25 MCG tablet TAKE 1 TABLET (25 MCG TOTAL) BY MOUTH DAILY BEFORE BREAKFAST. (Patient not taking: Reported on 11/24/2018)   . loperamide (IMODIUM A-D) 2 MG tablet Take 2 mg by mouth 3 (three) times daily as needed for diarrhea or loose stools.    . Menthol-Zinc Oxide (GOLD BOND EX) Apply 1 application topically daily as needed (muscle pain).   . metoprolol succinate (TOPROL XL) 25 MG 24 hr tablet Take 1 tablet (25 mg total) by mouth daily.   . Multiple Vitamins-Minerals (MULTIVITAMIN ADULTS 50+ PO) Take 1 tablet by mouth every morning.   . nitroGLYCERIN (NITROSTAT) 0.4 MG SL tablet Place 1 tablet (0.4 mg total) under the tongue every 5 (five) minutes x 3 doses as needed for chest pain. (Patient not taking: Reported on 11/24/2018)   . omeprazole (PRILOSEC) 40 MG capsule Take 1 capsule (40 mg total) by mouth 2 (two) times daily.   [name] Place 1 drop into both eyes daily as needed (dry eyes).   [name]  PROVENTIL HFA 108 (90 Base) MCG/ACT inhaler Inhale 2 puffs into the lungs every 6 (six) hours as needed for wheezing or shortness of breath.   . ranolazine (RANEXA) 500 MG 12 hr tablet Take 1 tablet (500 mg total) by mouth 2 (two) times daily.   . rosuvastatin (CRESTOR) 5 MG tablet Take 1 tablet (5 mg total) by mouth daily. (Patient taking differently: Take 5 mg by mouth at bedtime. )   . torsemide (DEMADEX) 20 MG tablet Take 1 tablet (20 mg total) by mouth 2 (two) times daily.   . traMADol (ULTRAM) 50 MG tablet Take  1 tablet (50 mg total) by mouth every 8 (eight) hours as needed for moderate pain.   [name] warfarin (COUMADIN) 3 MG tablet Take 1 tablet (3 mg total) by mouth daily. Take as directed by the coumadin clinic. (Patient taking differently: Take 3-4.5 mg by mouth See admin instructions. Take 3 mg at night on Sun, Tue, Wed, Thur, and Sat.  Take 4.5 mg at night on Mon and Fri.)    No facility-administered encounter medications on file as of 11/25/2018.     Goals Addressed            This Visit's Progress   . Medication Managment       Current Barriers:  . Financial . Limited vision  Pharmacist Clinical Goal(s):  [name] Over the next 30 days, patient will work with CM Pharmacist to address needs related to medication adherence and diabetes management  Interventions: . Collaboration with PCP - provider reports Mr. Lagman has requested a new glucometer . Perform chart review o Per encounter with Nurse Health Advisor from yesterday, Mr. Mccauley reported no longer taking isosorbide mononitrate. o Per 8/26 office visit note from Dr. Saunders Revel, patient was instructed to start ranolazine 500 mg twice daily and have a consult with Pulmonologist regarding shortness of breath. Do not see notation about stopping isosorbide, nor was medication discontinued from medication list in chart at this encounter . Follow up with Mr and Mrs. Tripp regarding need for new glucometer. Patient reports that his current meter is working, but has started to have more errors recently (machine ~ 83 years old) o Note Accu-Chek Aviva Plus meter and supplies preferred option through patient's health plan . Follow up with Mr and Mrs. Epstein regarding patient's prescription for isosorbide. Mr and Mrs. Buzby state that they understood from patient's last visit with Dr. Saunders Revel, that Mr. Dimaio was to hold his isosorbide when starting the ranolazine o Let patient/wife know that I will call to follow up with Cardiologist to confirm and then request  a call back to patient. . Coordination of care call to [facility]- speak with Lars Pinks, RN, to request clarification about whether patient was to stop/hold isosorbide . Will ask PCP to send prescriptions for Accu-Chek Aviva Plus meter and supplies to [facility] for patient.  Patient Self Care Activities:  . Check blood sugar, blood pressure and daily weight as directed by providers  . Takes medications, with assistance of wife, as directed . Patient attends scheduled medical appointments  Please see past updates related to this goal by clicking on the "Past Updates" button in the selected goal         Plan  The care management team will reach out to the patient again over the next 14 days.   [name], PharmD, [facility] [employer] [phone]

## 2018-11-25 NOTE — Telephone Encounter (Signed)
Lucas Caldwell was to add ranolazine to his previous medications (remaining on isosorbide mononitrate at previous dose).  [name], MD [facility] Pager: [phone]

## 2018-11-25 NOTE — Telephone Encounter (Signed)
Also ordered  Accu chek Aviva Plus meter as well as Accu chek Softclix lancets and Accu Chek Aviva Plus test strips to [facility]

## 2018-11-25 NOTE — Telephone Encounter (Signed)
Wife called requesting refill on  Tramadol  50 mg  Called into [facility]

## 2018-11-25 NOTE — Telephone Encounter (Signed)
PCP office calling in to confirm medication changes that patient discussed with PCP. The office is wanting to make sure to clarify if Dr. Saunders Revel wanted to DC the isosorbide. Patient started the ranolazine, patient was unsure if he was to stop/hold the isosorbide. Please advise Benjamine Mola at [name], [phone]

## 2018-11-25 NOTE — Telephone Encounter (Signed)
Benjamine Mola, PharmD, calling back. She was following patient for another issue and discovered patient has not been taking the isosorbide.  Patient and wife were under the impression to stop isosorbide, start ranolazine and then see what pulmonologist said as they attempt to figure out what is going on.  She would prefer [place] to reach out to patient once clarified with Dr End. Advised I would be glad to do so.

## 2018-11-25 NOTE — Telephone Encounter (Signed)
Last visit rx Tramadol 11/04/18 for Diabetic neuropathy foot pain and knee pain arthritis.  He was given #15 pills and they lasted 3 weeks.  I advised them to contact us if working for his neuropathy and to contact us if want to continue.  Agree to re order now.  Sent new rx Tramadol 50mg  take 1 every 8 hr PRN pain, but should use sparingly if not needed.  Sent #30 pills for up to 1 month supply, with +2 refills.  We can discuss at future visit if dosing needs to be adjusted.  Please notify patient/wife that rx Tramadol was sent, for 1 month supply and refills.  [name], [facility] 11/25/2018, 9:34 AM

## 2018-11-25 NOTE — Telephone Encounter (Signed)
No answer and left message for Benjamine Mola to call me back.  It appears from the [place] on 11/11/18 with Dr End, he was adding the ranolazine to current medications.  Will route to Dr End to clarify.

## 2018-11-26 ENCOUNTER — Institutional Professional Consult (permissible substitution): Payer: Medicare HMO | Admitting: Internal Medicine

## 2018-11-26 ENCOUNTER — Ambulatory Visit
Admit: 2018-11-26 | Discharge: 2018-11-27 | Payer: MEDICARE | Attending: Hematology & Oncology | Primary: Hematology & Oncology

## 2018-11-26 DIAGNOSIS — Z79899 Other long term (current) drug therapy: Secondary | ICD-10-CM

## 2018-11-26 DIAGNOSIS — Z794 Long term (current) use of insulin: Secondary | ICD-10-CM

## 2018-11-26 DIAGNOSIS — Z23 Encounter for immunization: Secondary | ICD-10-CM

## 2018-11-26 DIAGNOSIS — Z7901 Long term (current) use of anticoagulants: Secondary | ICD-10-CM

## 2018-11-26 DIAGNOSIS — C9210 Chronic myeloid leukemia, BCR/ABL-positive, not having achieved remission: Secondary | ICD-10-CM

## 2018-11-26 MED ORDER — BOSUTINIB 100 MG TABLET
ORAL_TABLET | Freq: Every day | ORAL | 5 refills | 30 days | Status: CP
Start: 2018-11-26 — End: ?

## 2018-11-27 ENCOUNTER — Ambulatory Visit
Admission: RE | Admit: 2018-11-27 | Discharge: 2018-11-27 | Disposition: A | Discharge status: Home / Self Care | Payer: Medicare HMO | Source: Ambulatory Visit | Attending: Internal Medicine | Admitting: Internal Medicine

## 2018-11-27 ENCOUNTER — Encounter: Payer: Self-pay | Admitting: Internal Medicine

## 2018-11-27 ENCOUNTER — Other Ambulatory Visit: Payer: Self-pay

## 2018-11-27 ENCOUNTER — Ambulatory Visit: Payer: Medicare HMO | Admitting: Internal Medicine

## 2018-11-27 VITALS — BP 124/78 | HR 82 | Temp 98.2°F | Ht 64.0 in | Wt 203.2 lb

## 2018-11-27 DIAGNOSIS — R0602 Shortness of breath: Secondary | ICD-10-CM

## 2018-11-27 DIAGNOSIS — G4719 Other hypersomnia: Secondary | ICD-10-CM

## 2018-11-27 DIAGNOSIS — I5022 Chronic systolic (congestive) heart failure: Secondary | ICD-10-CM

## 2018-11-27 MED ORDER — SPIRIVA RESPIMAT 1.25 MCG/ACT IN AERS: 2.0000 | INHALATION_SPRAY | Freq: Every day | RESPIRATORY_TRACT | 6 refills | Status: DC

## 2018-11-27 NOTE — Progress Notes (Signed)
Name: Lucas Caldwell MRN: [id number] DOB: 1933/03/30     CONSULTATION DATE: 11/27/2018  REFERRING MD : END  CHIEF COMPLAINT: SOB  STUDIES:     CXR independently reviewed by Me today 12/2017 NL CXR No edema No effusions   HISTORY OF PRESENT ILLNESS: 85 year old pleasant African-American male seen today for progressive shortness of breath for the last 2 months Associated with nonproductive cough  Patient does have a history of significant cardiomyopathy with an EF of 30% with severe hypokinesis  Patient was referred to [place] from cardiology for further assessment of shortness of breath Previous chest x-ray in 2019 shows no obvious lung findings Patient is a former smoker smoked about 30 years 2 packs a day but quit many years ago  Patient is not in any distress at this time He does have intermittent wheezing associated with intermittent nonproductive cough    No evidence or signs of infection at this time No respiratory distress No fevers, chills, nausea, vomiting, diarrhea +lower extremity edema No evidence hemoptysis   History of excessive daytime sleepiness Patient  has been having sleep problems for many years Patient has been having excessive daytime sleepiness for a long time Patient has been having extreme fatigue and tiredness, lack of energy +  very Loud snoring every night + struggling breathe at night and gasps for air   Discussed sleep data and reviewed with patient.  Encouraged proper weight management.  Discussed driving precautions and its relationship with hypersomnolence.  Discussed operating dangerous equipment and its relationship with hypersomnolence.  Discussed sleep hygiene, and benefits of a fixed sleep waked time.  The importance of getting eight or more hours of sleep discussed with patient.  Discussed limiting the use of the computer and television before bedtime.  Decrease naps during the day, so night time sleep will become enhanced.   Limit caffeine, and sleep deprivation.  HTN, stroke, and heart failure are potential risk factors.    EPWORTH SLEEP SCORE 0    PAST MEDICAL HISTORY :   has a past medical history of Arthritis, Blind right eye, BPH (benign prostatic hyperplasia), Breast asymmetry, CAD (coronary artery disease), Chronic combined systolic (congestive) and diastolic (congestive) heart failure (HCC), Chronic kidney disease (CKD), stage IV (severe) ([place]), CML (chronic myelocytic leukemia) ([place]), GERD (gastroesophageal reflux disease), GIB (gastrointestinal bleeding), Gout, Hyperlipemia, Hypertension, Hypothyroidism, Iron deficiency anemia, Ischemic cardiomyopathy, Migraine, PAF (paroxysmal atrial fibrillation) ([place]), Prostate cancer ([place]), RBBB, and Type II diabetes mellitus ([place]).  has a past surgical history that includes Toe amputation (Right); LEFT HEART CATH AND CORONARY ANGIOGRAPHY (N/A, 08/14/2016); Hernia repair; Cataract extraction w/ intraocular lens  implant, bilateral (Bilateral); Coronary angioplasty with stent (09/2012); CORONARY STENT INTERVENTION (N/A, 08/19/2016); Esophagogastroduodenoscopy (N/A, 12/20/2016); Esophagogastroduodenoscopy (egd) with propofol (N/A, 11/03/2017); Esophageal manometry (N/A, 12/16/2017); Eye surgery; Colonoscopy with propofol (N/A, 01/01/2018); Esophagogastroduodenoscopy (N/A, 01/13/2018); Colonoscopy (N/A, 01/13/2018); and RIGHT/LEFT HEART CATH AND CORONARY ANGIOGRAPHY (N/A, 09/01/2018). Prior to Admission medications   Medication Sig Start Date End Date Taking? Authorizing Provider  ACCU-CHEK AVIVA PLUS test strip Use to check blood sugar up to 2 times daily 11/25/18  Yes Karamalegos, [name], DO  Accu-Chek Softclix Lancets lancets Use to check blood sugar up to 2 times daily. 11/25/18  Yes Karamalegos, [name], DO  acetaminophen (TYLENOL) 500 MG tablet Take 1,000 mg by mouth every 6 (six) hours as needed for moderate pain or headache.    Yes [provider]  Alcohol  Swabs 70 % PADS Use when checking blood sugar  up to 3x daily 04/09/17  Yes Karamalegos, [name], DO  allopurinol (ZYLOPRIM) 100 MG tablet TAKE 1 TABLET EVERY DAY 09/28/18  Yes Karamalegos, [name], DO  Blood Glucose Monitoring Suppl (ACCU-CHEK AVIVA PLUS) w/Device KIT Use to check blood sugar up to 2 times daily 11/25/18  Yes Karamalegos, [name], DO  bosutinib (BOSULIF) 100 MG tablet Take 100 mg by mouth at bedtime as needed (tired/not feeling well). Take with food.    Yes [provider]  Dulaglutide (TRULICITY) 3.53 IR/4.4RX SOPN Inject 0.75 mg into the skin once a week. Patient taking differently: Inject 0.75 mg into the skin every Saturday.  10/31/17  Yes Karamalegos, Alexander J, DO  enalapril (VASOTEC) 5 MG tablet TAKE ONE TABLET BY MOUTH EVERY DAY 09/02/18  Yes Karamalegos, [name], DO  gabapentin (NEURONTIN) 300 MG capsule Take 2 capsules (600 mg total) by mouth 2 (two) times daily. Max dose = 1281m daily for kidney function 07/12/18  Yes Karamalegos, ADevonne Doughty DO  Insulin Glargine ([name] KWIKPEN) 100 UNIT/ML SOPN Inject 60 Units into the skin daily.   Yes [provider]  Insulin Pen Needle 31G X 5 MM MISC 31 g by Does not apply route as directed. 04/16/17  Yes [employer] NP  isosorbide mononitrate (IMDUR) 60 MG 24 hr tablet Take 1.5 tablets (90 mg total) by mouth daily. 11/25/18 02/23/19 Yes End, CHarrell Gave MD  levothyroxine (SYNTHROID) 25 MCG tablet TAKE 1 TABLET (25 MCG TOTAL) BY MOUTH DAILY BEFORE BREAKFAST. 09/28/18  Yes Karamalegos, ADevonne Doughty DO  loperamide (IMODIUM A-D) 2 MG tablet Take 2 mg by mouth 3 (three) times daily as needed for diarrhea or loose stools.    Yes [provider]  Menthol-Zinc Oxide (GOLD BOND EX) Apply 1 application topically daily as needed (muscle pain).   Yes [provider]  metoprolol succinate (TOPROL XL) 25 MG 24 hr tablet Take 1 tablet (25 mg total) by mouth daily. 03/06/18  Yes End,  CHarrell Gave MD  Multiple Vitamins-Minerals (MULTIVITAMIN ADULTS 50+ PO) Take 1 tablet by mouth every morning.   Yes [provider]  nitroGLYCERIN (NITROSTAT) 0.4 MG SL tablet Place 1 tablet (0.4 mg total) under the tongue every 5 (five) minutes x 3 doses as needed for chest pain. 04/22/18  Yes BTheora Gianotti NP  omeprazole (PRILOSEC) 40 MG capsule Take 1 capsule (40 mg total) by mouth 2 (two) times daily. 11/03/18 12/03/18 Yes Vanga, RTally Due MD  Polyethyl Glycol-Propyl Glycol (SYSTANE OP) Place 1 drop into both eyes daily as needed (dry eyes).   Yes [provider]  PROVENTIL HFA 108 (90 Base) MCG/ACT inhaler Inhale 2 puffs into the lungs every 6 (six) hours as needed for wheezing or shortness of breath. 07/16/18  Yes Karamalegos, ADevonne Doughty DO  ranolazine (RANEXA) 500 MG 12 hr tablet Take 1 tablet (500 mg total) by mouth 2 (two) times daily. 11/11/18  Yes End, CHarrell Gave MD  torsemide (DEMADEX) 20 MG tablet Take 1 tablet (20 mg total) by mouth 2 (two) times daily. 10/16/18  Yes BTheora Gianotti NP  traMADol (ULTRAM) 50 MG tablet Take 1 tablet (50 mg total) by mouth every 8 (eight) hours as needed for moderate pain. 11/25/18  Yes Karamalegos, ADevonne Doughty DO  warfarin (COUMADIN) 3 MG tablet Take 1 tablet (3 mg total) by mouth daily. Take as directed by the coumadin clinic. Patient taking differently: Take 3-4.5 mg by mouth See admin instructions. Take 3 mg at night on Sun, Tue,  Wed, [place], and Sat.  Take 4.5 mg at night on Mon and Fri. 04/17/18  Yes End, [name], MD  rosuvastatin (CRESTOR) 5 MG tablet Take 1 tablet (5 mg total) by mouth daily. Patient taking differently: Take 5 mg by mouth at bedtime.  07/14/18 11/24/18  End, [name], MD   Allergies  Allergen Reactions  . Clopidogrel Anaphylaxis    altered mental status;  (electrolytes "out of wack" and confusion per family; THEY DO NOT REMEMBER ANY OTHER REACTION)- MSB 10/10/15  . Ciprofloxacin Itching   . Mirtazapine Diarrhea    Bad dreams   . Spironolactone     gynecomastia    FAMILY HISTORY:  family history includes Brain cancer in his father; Diabetes in his sister; Heart attack in his sister. SOCIAL HISTORY:  reports that he quit smoking about 41 years ago. His smoking use included cigarettes. He has a 30.00 pack-year smoking history. He has quit using smokeless tobacco. He reports that he does not drink alcohol or use drugs.    Review of Systems:  Gen:  Denies  fever, sweats, chills weigh loss  HEENT: Denies blurred vision, double vision, ear pain, eye pain, hearing loss, nose bleeds, sore throat Cardiac:  No dizziness, chest pain or heaviness, chest tightness,edema, No JVD Resp+cough, -sputum production, +shortness of breath,-wheezing, -hemoptysis,  Gi: Denies swallowing difficulty, stomach pain, nausea or vomiting, diarrhea, constipation, bowel incontinence Gu:  Denies bladder incontinence, burning urine Ext:   +swelling Skin: Denies  skin rash, easy bruising or bleeding or hives Endoc:  Denies polyuria, polydipsia , polyphagia or weight change Psych:   Denies depression, insomnia or hallucinations  Other:  All other systems negative    BP 124/78 (BP Location: Left Arm, Cuff Size: Normal)   Pulse 82   Temp 98.2 F (36.8 C) (Temporal)   Ht _0  (1.626 m)   Wt 203 lb 3.2 oz (92.2 kg)   SpO2 99%   BMI 34.88 kg/m    SpO2: 99 % O2 Device: None (Room air)    Physical Examination:   GENERAL:NAD, no fevers, chills, no weakness no fatigue HEAD: Normocephalic, atraumatic.  EYES: PERLA, EOMI No scleral icterus.  MOUTH: Moist mucosal membrane.  EAR, NOSE, THROAT: Clear without exudates. No external lesions.  NECK: Supple.  PULMONARY: CTA B/L no wheezing, rhonchi, crackles CARDIOVASCULAR: S1 and S2. Regular rate and rhythm. No murmurs GASTROINTESTINAL: Soft, nontender, nondistended. Positive bowel sounds.  MUSCULOSKELETAL:+ edema.  NEUROLOGIC: No gross focal  neurological deficits. 5/5 strength all extremities SKIN: No ulceration, lesions, rashes, or cyanosis.  PSYCHIATRIC: Insight, judgment intact. -depression -anxiety ALL OTHER ROS ARE NEGATIVE   MEDICATIONS: I have reviewed all medications and confirmed regimen as documented     ECHO 08/2018 1. Severe hypokinesis of the left ventricular, entire inferior wall.  2. The left ventricle has moderately reduced systolic function, with an ejection fraction of 35-40%. The cavity size was normal. There is mildly increased left ventricular wall thickness. Left ventricular diastolic Doppler parameters are indeterminate.  Left ventricular diffuse hypokinesis.  3. The tricuspid valve is grossly normal.  4. The aortic valve is tricuspid.  5. Mildly dilated pulmonary artery.  6. The interatrial septum was not well visualized.     ASSESSMENT AND PLAN SYNOPSIS  85 year old pleasant African-American male is here for assessment of his progressive shortness of breath over the last couple months  At this time I need further testing for diagnostic evaluation with pulmonary function testing as well as sleep study to assess for underlying  sleep apnea Patient history of excessive daytime sleepiness and snoring and increased work of breathing at night  Based on a history of severe cardiomyopathy with a EF of 30% sleep apnea needs to be ruled out Follow-up with cardiology as scheduled  Patient has a significant former smoking history and COPD is a possibility as well Pulmonary function testing to be obtained to assess for obstructive lung disease I will start patient on Spiriva Respimat to assess respiratory status I also will need to obtain chest x-ray two-view to assess lung fields   COVID-19 EDUCATION: The signs and symptoms of COVID-19 were discussed with the patient and how to seek care for testing.  The importance of social distancing was discussed today. Hand Washing Techniques and avoid touching  face was advised.  MEDICATION ADJUSTMENTS/LABS AND TESTS ORDERED: CHECK CXR 2 view  Obtain sleep study  Obtain PFT's  START SPIRIVA RESPIMAT   CURRENT MEDICATIONS REVIEWED AT LENGTH WITH PATIENT TODAY   Patient satisfied with Plan of action and management. All questions answered  Follow up in 3 months   Soni Kegel Patricia Pesa, M.D.  [facility]  Medical Director [place] Director [facility] Cardio-Pulmonary Department

## 2018-11-27 NOTE — Patient Instructions (Addendum)
CHECK CXR 2 view  Obtain sleep study  Obtain PFT's  START SPIRIVA RESPIMAT

## 2018-12-04 ENCOUNTER — Telehealth: Payer: Self-pay

## 2018-12-04 NOTE — Telephone Encounter (Signed)
I notified the patient wife that his Trulicity came in the mail today and is available for pick up between the hours of 8-12 and 1-4pm. She verbalize understanding.

## 2018-12-07 ENCOUNTER — Other Ambulatory Visit: Payer: Self-pay

## 2018-12-07 ENCOUNTER — Ambulatory Visit (INDEPENDENT_AMBULATORY_CARE_PROVIDER_SITE_OTHER): Payer: Medicare HMO

## 2018-12-07 DIAGNOSIS — Z7901 Long term (current) use of anticoagulants: Secondary | ICD-10-CM | POA: Diagnosis not present

## 2018-12-07 DIAGNOSIS — I480 Paroxysmal atrial fibrillation: Secondary | ICD-10-CM | POA: Diagnosis not present

## 2018-12-07 LAB — POCT INR: INR: 2.6 (ref 2.0–3.0)

## 2018-12-07 NOTE — Patient Instructions (Signed)
Please continue dosage of 1 TABLET EVERY DAY EXCEPT 1.5 TABLETS ON MONDAYS & FRIDAYS. Recheck INR in 6 weeks.    

## 2018-12-09 ENCOUNTER — Ambulatory Visit (INDEPENDENT_AMBULATORY_CARE_PROVIDER_SITE_OTHER): Payer: Medicare HMO | Admitting: Pharmacist

## 2018-12-09 DIAGNOSIS — E1121 Type 2 diabetes mellitus with diabetic nephropathy: Secondary | ICD-10-CM

## 2018-12-09 DIAGNOSIS — R0602 Shortness of breath: Secondary | ICD-10-CM

## 2018-12-09 DIAGNOSIS — E1165 Type 2 diabetes mellitus with hyperglycemia: Secondary | ICD-10-CM | POA: Diagnosis not present

## 2018-12-09 DIAGNOSIS — IMO0002 Reserved for concepts with insufficient information to code with codable children: Secondary | ICD-10-CM

## 2018-12-09 NOTE — Chronic Care Management (AMB) (Signed)
Chronic Care Management   Follow Up Note   12/09/2018 Name: Lucas Caldwell MRN: [id number] DOB: 02-19-1934  Referred by: [name], DO Reason for referral : Chronic Care Management (Patient Phone Call)   Lucas Caldwell is a 85 y.o. year old male who is a primary care patient of [name], DO. The CCM team was consulted for assistance with chronic disease management and care coordination needs.  [name] has a past medical history including but not limited to type 2 diabetes, CKD, hyperlipidemia, COPD, atrial fibrillation, hypertension, coronary artery disease, Chronic HFpEF, hypothyroidism, and CML  I reached out to [name] by phone today.   Review of patient status, including review of consultants reports, relevant laboratory and other test results, and collaboration with appropriate care team members and the patient's provider was performed as part of comprehensive patient evaluation and provision of chronic care management services.     Outpatient Encounter Medications as of 12/09/2018  Medication Sig Note  . isosorbide mononitrate (IMDUR) 60 MG 24 hr tablet Take 1.5 tablets (90 mg total) by mouth daily.   [name] ACCU-CHEK AVIVA PLUS test strip Use to check blood sugar up to 2 times daily   . Accu-Chek Softclix Lancets lancets Use to check blood sugar up to 2 times daily.   [name] acetaminophen (TYLENOL) 500 MG tablet Take 1,000 mg by mouth every 6 (six) hours as needed for moderate pain or headache.    . Alcohol Swabs 70 % PADS Use when checking blood sugar up to 3x daily   . allopurinol (ZYLOPRIM) 100 MG tablet TAKE 1 TABLET EVERY DAY   . Blood Glucose Monitoring Suppl (ACCU-CHEK AVIVA PLUS) w/Device KIT Use to check blood sugar up to 2 times daily   . bosutinib (BOSULIF) 100 MG tablet Take 100 mg by mouth at bedtime as needed (tired/not feeling well). Take with food.  08/31/2018: Pt's wife states he only takes this as needed when he's "not feeling  well"  . Dulaglutide (TRULICITY) 3.71 IR/6.7EL SOPN Inject 0.75 mg into the skin once a week. (Patient taking differently: Inject 0.75 mg into the skin every Saturday. )   . enalapril (VASOTEC) 5 MG tablet TAKE ONE TABLET BY MOUTH EVERY DAY   . gabapentin (NEURONTIN) 300 MG capsule Take 2 capsules (600 mg total) by mouth 2 (two) times daily. Max dose = 1235m daily for kidney function   . Insulin Glargine ([name] KWIKPEN) 100 UNIT/ML SOPN Inject 60 Units into the skin daily.   . Insulin Pen Needle 31G X 5 MM MISC 31 g by Does not apply route as directed.   .Marland Kitchenlevothyroxine (SYNTHROID) 25 MCG tablet TAKE 1 TABLET (25 MCG TOTAL) BY MOUTH DAILY BEFORE BREAKFAST.   .Marland Kitchenloperamide (IMODIUM A-D) 2 MG tablet Take 2 mg by mouth 3 (three) times daily as needed for diarrhea or loose stools.    . Menthol-Zinc Oxide (GOLD BOND EX) Apply 1 application topically daily as needed (muscle pain).   . metoprolol succinate (TOPROL XL) 25 MG 24 hr tablet Take 1 tablet (25 mg total) by mouth daily.   . Multiple Vitamins-Minerals (MULTIVITAMIN ADULTS 50+ PO) Take 1 tablet by mouth every morning.   . nitroGLYCERIN (NITROSTAT) 0.4 MG SL tablet Place 1 tablet (0.4 mg total) under the tongue every 5 (five) minutes x 3 doses as needed for chest pain.   .Marland Kitchenomeprazole (PRILOSEC) 40 MG capsule Take 1 capsule (40 mg total) by mouth 2 (two) times daily.   .[name]  Polyethyl Glycol-Propyl Glycol (SYSTANE OP) Place 1 drop into both eyes daily as needed (dry eyes).   [name] PROVENTIL HFA 108 (90 Base) MCG/ACT inhaler Inhale 2 puffs into the lungs every 6 (six) hours as needed for wheezing or shortness of breath.   . ranolazine (RANEXA) 500 MG 12 hr tablet Take 1 tablet (500 mg total) by mouth 2 (two) times daily.   . rosuvastatin (CRESTOR) 5 MG tablet Take 1 tablet (5 mg total) by mouth daily. (Patient taking differently: Take 5 mg by mouth at bedtime. )   . Tiotropium Bromide Monohydrate (SPIRIVA RESPIMAT) 1.25 MCG/ACT AERS Inhale 2 puffs into the  lungs daily.   [name] torsemide (DEMADEX) 20 MG tablet Take 1 tablet (20 mg total) by mouth 2 (two) times daily.   . traMADol (ULTRAM) 50 MG tablet Take 1 tablet (50 mg total) by mouth every 8 (eight) hours as needed for moderate pain.   [name] warfarin (COUMADIN) 3 MG tablet Take 1 tablet (3 mg total) by mouth daily. Take as directed by the coumadin clinic. (Patient taking differently: Take 3-4.5 mg by mouth See admin instructions. Take 3 mg at night on Sun, Tue, Wed, Thur, and Sat.  Take 4.5 mg at night on Mon and Fri.)    No facility-administered encounter medications on file as of 12/09/2018.     Goals Addressed            This Visit's Progress   . PharmD - Medication Managment       Current Barriers:  . Financial - reports copayment for Spiriva Respimat inhaler through patient's Part D coverage is not affordable o Note patient has Limited Partial Extra Help Subsidy . Limited vision  Pharmacist Clinical Goal(s):  [name] Over the next 30 days, patient will work with CM Pharmacist to address needs related to medication adherence and diabetes management  Interventions: . Follow up regarding blood sugar monitoring and control o Confirms patient received new glucometer and supplies from [employer] o Reports recent morning fasting CBGs ranging 120s-140s . Follow up with Mr and Mrs. Ton regarding patient's prescription for isosorbide. Confirms understaning from [facility]- reports patient taking isosorbide as directed . Counsel on medication assistance - Reports Mr. Hildebran was started on Spiriva Respimat by [employer].  o Picked up 30 day supply, but reports unable to continue to afford copayment o Patient currently has partial extra help subsidy o Based on reported income, patient meets requirements for Patient Assistance Program for Spiriva Respimat ? Will collaborate with [facility] CPhT to assist patient with applying for patient assistance for Spiriva Respimat through  [place]  Patient Self Care Activities:  . Check blood sugar, blood pressure and daily weight as directed by providers  . Takes medications, with assistance of wife, as directed . Patient attends scheduled medical appointments  Please see past updates related to this goal by clicking on the "Past Updates" button in the selected goal         Plan   The care management team will reach out to the patient again over the next 30 days.   [name], PharmD, [place] [employer] [phone]

## 2018-12-09 NOTE — Patient Instructions (Signed)
Thank you allowing the Chronic Care Management Team to be a part of your care! It was a pleasure speaking with you today!     CCM (Chronic Care Management) Team    Janci Minor RN, BSN Nurse Care Coordinator  [phone]   [name] PharmD  Clinical Pharmacist  [phone]   Eula Fried LCSW Clinical Social Worker [phone]  Visit Information  Goals Addressed            This Visit's Progress   . PharmD - Medication Managment       Current Barriers:  . Financial - reports copayment for Spiriva Respimat inhaler through patient's Part D coverage is not affordable o Note patient has Limited Partial Extra Help Subsidy . Limited vision  Pharmacist Clinical Goal(s):  [name] Over the next 30 days, patient will work with CM Pharmacist to address needs related to medication adherence and diabetes management  Interventions: . Follow up regarding blood sugar monitoring and control o Confirms patient received new glucometer and supplies from [employer] o Reports recent morning fasting CBGs ranging 120s-140s . Follow up with Mr and Mrs. Badders regarding patient's prescription for isosorbide. Confirms understaning from [facility]- reports patient taking isosorbide as directed . Counsel on medication assistance - Reports Mr. Wainer was started on Spiriva Respimat by [employer].  o Picked up 30 day supply, but reports unable to continue to afford copayment o Patient currently has partial extra help subsidy o Based on reported income, patient meets requirements for Patient Assistance Program for Spiriva Respimat ? Will collaborate with [facility] CPhT to assist patient with applying for patient assistance for Spiriva Respimat through [place]  Patient Self Care Activities:  . Check blood sugar, blood pressure and daily weight as directed by providers  . Takes medications, with assistance of wife, as directed . Patient attends scheduled medical  appointments  Please see past updates related to this goal by clicking on the "Past Updates" button in the selected goal         The patient verbalized understanding of instructions provided today and declined a print copy of patient instruction materials.   The care management team will reach out to the patient again over the next 30 days.   [name], PharmD, [place] [employer] [phone]

## 2018-12-10 ENCOUNTER — Other Ambulatory Visit: Payer: Self-pay | Admitting: Pharmacy Technician

## 2018-12-10 NOTE — Patient Outreach (Signed)
[facility]) Care Management  12/10/2018  Lucas Caldwell Mar 24, 1933 [id number]                                        Medication Assistance Referral  Referral From: [facility] RPh)  Medication/Company: Stann Ore Respimat / BI Patient application portion:  Mailed Provider application portion: Faxed  to Dr. Flora Lipps Provider address/fax verified via: Office website     Follow up:  Will follow up with patient in 5-10 business days to confirm application(s) have been received.  Alok Minshall P. Codee Bloodworth, [place] [facility] [phone]

## 2018-12-15 ENCOUNTER — Other Ambulatory Visit: Payer: Self-pay

## 2018-12-15 ENCOUNTER — Emergency Department
Admission: EM | Admit: 2018-12-15 | Discharge: 2018-12-15 | Disposition: A | Discharge status: Home / Self Care | Payer: Medicare HMO | Attending: Emergency Medicine | Admitting: Emergency Medicine

## 2018-12-15 ENCOUNTER — Emergency Department: Payer: Medicare HMO

## 2018-12-15 ENCOUNTER — Encounter: Payer: Self-pay | Admitting: *Deleted

## 2018-12-15 DIAGNOSIS — N184 Chronic kidney disease, stage 4 (severe): Secondary | ICD-10-CM | POA: Diagnosis not present

## 2018-12-15 DIAGNOSIS — I208 Other forms of angina pectoris: Secondary | ICD-10-CM

## 2018-12-15 DIAGNOSIS — I5042 Chronic combined systolic (congestive) and diastolic (congestive) heart failure: Secondary | ICD-10-CM | POA: Diagnosis not present

## 2018-12-15 DIAGNOSIS — I130 Hypertensive heart and chronic kidney disease with heart failure and stage 1 through stage 4 chronic kidney disease, or unspecified chronic kidney disease: Secondary | ICD-10-CM | POA: Diagnosis not present

## 2018-12-15 DIAGNOSIS — I200 Unstable angina: Secondary | ICD-10-CM | POA: Diagnosis not present

## 2018-12-15 DIAGNOSIS — Z7901 Long term (current) use of anticoagulants: Secondary | ICD-10-CM | POA: Insufficient documentation

## 2018-12-15 DIAGNOSIS — Z79899 Other long term (current) drug therapy: Secondary | ICD-10-CM | POA: Diagnosis not present

## 2018-12-15 DIAGNOSIS — E1122 Type 2 diabetes mellitus with diabetic chronic kidney disease: Secondary | ICD-10-CM | POA: Insufficient documentation

## 2018-12-15 DIAGNOSIS — Z794 Long term (current) use of insulin: Secondary | ICD-10-CM | POA: Insufficient documentation

## 2018-12-15 DIAGNOSIS — I499 Cardiac arrhythmia, unspecified: Secondary | ICD-10-CM | POA: Diagnosis not present

## 2018-12-15 DIAGNOSIS — E039 Hypothyroidism, unspecified: Secondary | ICD-10-CM | POA: Diagnosis not present

## 2018-12-15 DIAGNOSIS — R1013 Epigastric pain: Secondary | ICD-10-CM | POA: Diagnosis not present

## 2018-12-15 DIAGNOSIS — I10 Essential (primary) hypertension: Secondary | ICD-10-CM | POA: Diagnosis not present

## 2018-12-15 DIAGNOSIS — R079 Chest pain, unspecified: Secondary | ICD-10-CM | POA: Diagnosis not present

## 2018-12-15 DIAGNOSIS — Z87891 Personal history of nicotine dependence: Secondary | ICD-10-CM | POA: Diagnosis not present

## 2018-12-15 LAB — BASIC METABOLIC PANEL
Anion gap: 10 (ref 5–15)
BUN: 24 mg/dL — ABNORMAL HIGH (ref 8–23)
CO2: 24 mmol/L (ref 22–32)
Calcium: 9.2 mg/dL (ref 8.9–10.3)
Chloride: 100 mmol/L (ref 98–111)
Creatinine, Ser: 1.73 mg/dL — ABNORMAL HIGH (ref 0.61–1.24)
GFR calc Af Amer: 41 mL/min — ABNORMAL LOW (ref >60)
GFR calc non Af Amer: 36 mL/min — ABNORMAL LOW (ref >60)
Glucose, Bld: 235 mg/dL — ABNORMAL HIGH (ref 70–99)
Potassium: 3.7 mmol/L (ref 3.5–5.1)
Sodium: 134 mmol/L — ABNORMAL LOW (ref 135–145)

## 2018-12-15 LAB — CBC WITH DIFFERENTIAL/PLATELET
Abs Immature Granulocytes: 0.03 10*3/uL (ref 0.00–0.07)
Basophils Absolute: 0 10*3/uL (ref 0.0–0.1)
Basophils Relative: 0 %
Eosinophils Absolute: 0.2 10*3/uL (ref 0.0–0.5)
Eosinophils Relative: 2 %
HCT: 36.4 % — ABNORMAL LOW (ref 39.0–52.0)
Hemoglobin: 11.7 g/dL — ABNORMAL LOW (ref 13.0–17.0)
Immature Granulocytes: 0 %
Lymphocytes Relative: 20 %
Lymphs Abs: 2 10*3/uL (ref 0.7–4.0)
MCH: 27.1 pg (ref 26.0–34.0)
MCHC: 32.1 g/dL (ref 30.0–36.0)
MCV: 84.5 fL (ref 80.0–100.0)
Monocytes Absolute: 1 10*3/uL (ref 0.1–1.0)
Monocytes Relative: 9 %
Neutro Abs: 7 10*3/uL (ref 1.7–7.7)
Neutrophils Relative %: 69 %
Platelets: 172 10*3/uL (ref 150–400)
RBC: 4.31 MIL/uL (ref 4.22–5.81)
RDW: 16.5 % — ABNORMAL HIGH (ref 11.5–15.5)
WBC: 10.2 10*3/uL (ref 4.0–10.5)
nRBC: 0 % (ref 0.0–0.2)

## 2018-12-15 LAB — PROTIME-INR
INR: 1.7 — ABNORMAL HIGH (ref 0.8–1.2)
Prothrombin Time: 19.7 seconds — ABNORMAL HIGH (ref 11.4–15.2)

## 2018-12-15 LAB — TROPONIN I (HIGH SENSITIVITY)
Troponin I (High Sensitivity): 10 ng/L (ref <18)
Troponin I (High Sensitivity): 11 ng/L (ref <18)

## 2018-12-15 MED ORDER — SENNA 8.6 MG PO TABS: 1.0000 | ORAL_TABLET | Freq: Every day | ORAL | 0 refills | Status: AC

## 2018-12-15 MED ORDER — ASPIRIN 81 MG PO CHEW
324.0000 mg | CHEWABLE_TABLET | Freq: Once | ORAL | Status: AC
  Administered 2018-12-15: 04:00:00 324 mg via ORAL
  Filled 2018-12-15: qty 4

## 2018-12-15 NOTE — ED Triage Notes (Signed)
Pt arrived via [facility] with chest pain that started around 9pm. Hx of stents and cath. Pt took his own Nitro and is now pain free. Hx HTN, DM

## 2018-12-15 NOTE — ED Provider Notes (Signed)
[facility] Emergency Department Provider Note  ____________________________________________  Time seen: Approximately 3:22 AM  I have reviewed the triage vital signs and the nursing notes.   HISTORY  Chief Complaint Chest Pain   HPI Lucas Caldwell is a 85 y.o. male with a history of severe CAD status post several stents, CHF with a EF of 35 to 40%, chronic kidney disease, CML, anemia, hypertension, hyperlipidemia, paroxysmal atrial fibrillation on Coumadin, diabetes who presents for evaluation of chest pain.  Pain woke him up from his sleep.  Patient reports 9 out of 10 central chest pressure associated with nausea and dizziness.  No shortness of breath.  Patient reports taking 2 nitros and by the time EMS arrived the pain was gone.  Patient endorses compliance with his medications.  Has no pain at this time.  No cough or fever, no leg pain or swelling, no hemoptysis, no exogenous hormones, no recent travel immobilization.   Past Medical History:  Diagnosis Date  . Arthritis    "probably in his legs" (08/15/2016)  . Blind right eye   . BPH (benign prostatic hyperplasia)   . Breast asymmetry    Left breast is larger, present for several years.  [name] CAD (coronary artery disease)    a. Cath in the late 90's - reportedly ok;  b. 2014 s/p stenting x 2 @ [facility]; c 08/19/16 Cath/PCI with DES -> RCA, plan to treat LM 70% medically. Seen by surgery and felt to be too high risk for CABG; d. 08/2018 Cath: LM 70 (iFR 0.86), LAD 20ost, 40p, 50/84m D1 70, LCX patent stent, OM1 30, RCA patent stent, 432mSR, 40d, RPDA 30. PCWP 8. CO/CI 4.0/2.1.  . Marland Kitchenhronic combined systolic (congestive) and diastolic (congestive) heart failure (HCNoorvik   a. Previously reduced EF-->50% by echo in 2012;  b. 06/2015 Echo: EF 50-55%  c. 07/2016 Echo: EF 45-50%; d. 12/2017 Echo: EF 55%, Gr1 DD, mild MR; e. 08/2018 Echo: EF 35-40%, mildly dil PA.  . Marland Kitchenhronic kidney disease (CKD), stage IV (severe) (HCLowes Island  .  CML (chronic myelocytic leukemia) (HCArnold  . GERD (gastroesophageal reflux disease)   . GIB (gastrointestinal bleeding)    a. 12/2017 3 unit PRBC GIB in setting of coumadin-->Endo/colon multiple duodenal ulcers and a single bleeding ulcer in the proximal ascending colon status post hemostatic clipping x2.  . Gout   . Hyperlipemia   . Hypertension   . Hypothyroidism   . Iron deficiency anemia   . Ischemic cardiomyopathy    a. Previously reduced EF-->50% by echo in 2012;  b. 06/2015 Echo: EF 50-55%, Gr2 DD, mild MR, mildly dil LA, Ao sclerosis, mild TR;  c. 07/2016 Echo: EF 45-50%; d. 12/2017 Echo: EF 55%, Gr1 DD, mild MR; e. 08/2018 Echo: EF 35-40%.  . Migraine    "in the 1960s" (08/15/2016)  . PAF (paroxysmal atrial fibrillation) (HCC)    a. ? Dx 2014-->s/p DCCV;  b. CHA2DS2VASc = 6--> Coumadin.  . Prostate cancer (HCMignon  . RBBB   . Type II diabetes mellitus ([facility]    Patient Active Problem List   Diagnosis Date Noted  . 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 (HCSt. Michaels06/10/2018  . CKD (chronic kidney disease), stage IV (HCCasa [place]03/19/2020  . Osteoarthritis of knees, bilateral 06/02/2018  . Hyperlipidemia associated with type 2 diabetes mellitus (HCMidtown02/19/2020  . Pain of lower extremity 03/06/2018  . Duodenitis 01/30/2018  . Chronic heart failure with  preserved ejection fraction (HFpEF) ([place]) 01/23/2018  . Hematochezia   . Melena   . Rectal bleeding 01/11/2018  . AVM (arteriovenous malformation) of colon without hemorrhage   . Schatzki's ring   . Esophageal dysphagia   . Diabetic retinopathy ([place]) 07/08/2017  . Ischemic cardiomyopathy 06/26/2017  . Cardiomyopathy ([place]) 06/26/2017  . Centrilobular emphysema ([place]) 06/13/2017  . Fatigue 12/18/2016  . Stable angina ([place]) 09/20/2016  . Chronic kidney disease, stage 3 ([place]) 09/20/2016  . Paroxysmal atrial fibrillation ([place]) 08/28/2016  . Chronic anticoagulation 08/28/2016  . Essential hypertension 08/20/2016  . Coronary  artery disease involving native coronary artery of native heart with unstable angina pectoris ([place]) 08/19/2016  . SOB (shortness of breath) 08/12/2016  . 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[place]) 12/29/2015  . Gynecomastia, male 03/31/2014  . Hypertrophy of breast 03/31/2014  . Nuclear sclerosis of left eye 10/22/2013  . Enthesopathy of ankle and tarsus 04/10/2013  . Uncontrolled type 2 diabetes mellitus with nephropathy ([place]) 04/09/2013  . Diabetic neuropathy associated with type 2 diabetes mellitus ([place]) 04/09/2013  . Cataract of left eye 11/25/2011  . Corneal opacity 09/30/2011  . Pseudophakia of right eye 09/30/2011  . GERD (gastroesophageal reflux disease) 09/25/2011  . Gout 09/25/2011  . Hyperlipidemia LDL goal <70 09/25/2011  . Status post cataract extraction 04/11/2011  . Cataract extraction status of eye 04/11/2011  . Iron deficiency anemia 04/01/2011  . Benign localized hyperplasia of prostate without urinary obstruction and other lower urinary tract symptoms (LUTS) 12/10/2010  . Enlarged prostate without lower urinary tract symptoms (luts) 12/10/2010  . Colon cancer screening 10/11/2010  . Hypothyroidism 10/11/2010  . Hyperlipidemia 09/20/2003  . Essential and other specified forms of tremor 01/05/2001  . Essential tremor 01/05/2001  . Chronic systolic heart failure ([place]) 06/22/1993    Past Surgical History:  Procedure Laterality Date  . CATARACT EXTRACTION W/ INTRAOCULAR LENS  IMPLANT, BILATERAL Bilateral   . COLONOSCOPY N/A 01/13/2018   Procedure: COLONOSCOPY;  Surgeon: Lin Landsman, MD;  Location: [facility] ENDOSCOPY;  Service: Gastroenterology;  Laterality: N/A;  . COLONOSCOPY WITH PROPOFOL N/A 01/01/2018   Procedure: COLONOSCOPY WITH PROPOFOL;  Surgeon: Lin Landsman, MD;  Location: [facility] ENDOSCOPY;  Service: Gastroenterology;  Laterality: N/A;  . CORONARY ANGIOPLASTY WITH STENT PLACEMENT  09/2012   2 stents  . CORONARY STENT INTERVENTION N/A 08/19/2016   Procedure: Coronary  Stent Intervention;  Surgeon: [name], MD;  Location: [place] CV LAB;  Service: Cardiovascular;  Laterality: N/A;  . ESOPHAGEAL MANOMETRY N/A 12/16/2017   Procedure: ESOPHAGEAL MANOMETRY (EM);  Surgeon: Lin Landsman, MD;  Location: [facility] ENDOSCOPY;  Service: Gastroenterology;  Laterality: N/A;  . ESOPHAGOGASTRODUODENOSCOPY N/A 12/20/2016   Procedure: ESOPHAGOGASTRODUODENOSCOPY (EGD);  Surgeon: Lin Landsman, MD;  Location: [facility] ENDOSCOPY;  Service: Gastroenterology;  Laterality: N/A;  . ESOPHAGOGASTRODUODENOSCOPY N/A 01/13/2018   Procedure: ESOPHAGOGASTRODUODENOSCOPY (EGD);  Surgeon: Lin Landsman, MD;  Location[facility] ENDOSCOPY;  Service: Gastroenterology;  Laterality: N/A;  . ESOPHAGOGASTRODUODENOSCOPY (EGD) WITH PROPOFOL N/A 11/03/2017   Procedure: ESOPHAGOGASTRODUODENOSCOPY (EGD) WITH PROPOFOL;  Surgeon: Lin Landsman, MD;  Location[facility] END[facility]COPY;  Service: Gastroenterology;  Laterality: N/A;  . EYE SURGERY    . HERNIA REPAIR     "navel"  . LEFT HEART CATH AND CORONARY ANGIOGRAPHY N/A 08/14/2016   Procedure: Left Heart Cath and Coronary Angiography;  Surgeon: [name], MD;  Location: [place] CV LAB;  Service: Cardiovascular;  Laterality: N/A;  . RIGHT/LEFT HEART CATH AND CORONARY ANGIOGRAPHY N/A 09/01/2018   Procedure: RIGHT/LEFT  HEART CATH AND CORONARY ANGIOGRAPHY;  Surgeon: [name], MD;  Location: [place] CV LAB;  Service: Cardiovascular;  Laterality: N/A;  . TOE AMPUTATION Right    "big toe"    Prior to Admission medications   Medication Sig Start Date End Date Taking? Authorizing Provider  ACCU-CHEK AVIVA PLUS test strip Use to check blood sugar up to 2 times daily 11/25/18   [name], DO  Accu-Chek Softclix Lancets lancets Use to check blood sugar up to 2 times daily. 11/25/18   Karamalegos, [name], DO  acetaminophen (TYLENOL) 500 MG tablet Take 1,000 mg by mouth every 6 (six) hours as needed for moderate  pain or headache.     [provider]  Alcohol Swabs 70 % PADS Use when checking blood sugar up to 3x daily 04/09/17   Parks Ranger, [name], DO  allopurinol (ZYLOPRIM) 100 MG tablet TAKE 1 TABLET EVERY DAY 09/28/18   Karamalegos, [name], DO  Blood Glucose Monitoring Suppl (ACCU-CHEK AVIVA PLUS) w/Device KIT Use to check blood sugar up to 2 times daily 11/25/18   Karamalegos, [name], DO  bosutinib (BOSULIF) 100 MG tablet Take 100 mg by mouth at bedtime as needed (tired/not feeling well). Take with food.     [provider]  Dulaglutide (TRULICITY) 0.98 JX/9.1YN SOPN Inject 0.75 mg into the skin once a week. Patient taking differently: Inject 0.75 mg into the skin every Saturday.  10/31/17   Karamalegos, [name], DO  enalapril (VASOTEC) 5 MG tablet TAKE ONE TABLET BY MOUTH EVERY DAY 09/02/18   Karamalegos, [name], DO  gabapentin (NEURONTIN) 300 MG capsule Take 2 capsules (600 mg total) by mouth 2 (two) times daily. Max dose = 125m daily for kidney function 07/12/18   Karamalegos, ADevonne Doughty DO  Insulin Glargine ([name] KWIKPEN) 100 UNIT/ML SOPN Inject 60 Units into the skin daily.    [provider]  Insulin Pen Needle 31G X 5 MM MISC 31 g by Does not apply route as directed. 04/16/17   KMikey College NP  isosorbide mononitrate (IMDUR) 60 MG 24 hr tablet Take 1.5 tablets (90 mg total) by mouth daily. 11/25/18 02/23/19  End, CHarrell Gave MD  levothyroxine (SYNTHROID) 25 MCG tablet TAKE 1 TABLET (25 MCG TOTAL) BY MOUTH DAILY BEFORE BREAKFAST. 09/28/18   Karamalegos, ADevonne Doughty DO  loperamide (IMODIUM A-D) 2 MG tablet Take 2 mg by mouth 3 (three) times daily as needed for diarrhea or loose stools.     [provider]  Menthol-Zinc Oxide (GOLD BOND EX) Apply 1 application topically daily as needed (muscle pain).    [provider]  metoprolol succinate (TOPROL XL) 25 MG 24 hr tablet Take 1 tablet (25 mg total) by mouth daily. 03/06/18    End, CHarrell Gave MD  Multiple Vitamins-Minerals (MULTIVITAMIN ADULTS 50+ PO) Take 1 tablet by mouth every morning.    [provider]  nitroGLYCERIN (NITROSTAT) 0.4 MG SL tablet Place 1 tablet (0.4 mg total) under the tongue every 5 (five) minutes x 3 doses as needed for chest pain. 04/22/18   BTheora Gianotti NP  omeprazole (PRILOSEC) 40 MG capsule Take 1 capsule (40 mg total) by mouth 2 (two) times daily. 11/03/18 12/03/18  VLin Landsman MD  Polyethyl Glycol-Propyl Glycol (SYSTANE OP) Place 1 drop into both eyes daily as needed (dry eyes).    [provider]  PROVENTIL HFA 108 (90 Base) MCG/ACT inhaler Inhale 2 puffs into the lungs every 6 (six) hours as  needed for wheezing or shortness of breath. 07/16/18   Karamalegos, [name], DO  ranolazine (RANEXA) 500 MG 12 hr tablet Take 1 tablet (500 mg total) by mouth 2 (two) times daily. 11/11/18   End, [name], MD  rosuvastatin (CRESTOR) 5 MG tablet Take 1 tablet (5 mg total) by mouth daily. Patient taking differently: Take 5 mg by mouth at bedtime.  07/14/18 11/24/18  End, [name], MD  senna (SENOKOT) 8.6 MG TABS tablet Take 1 tablet (8.6 mg total) by mouth at bedtime for 10 days. 12/15/18 12/25/18  Rudene Re, MD  Tiotropium Bromide Monohydrate (SPIRIVA RESPIMAT) 1.25 MCG/ACT AERS Inhale 2 puffs into the lungs daily. 11/27/18   Flora Lipps, MD  torsemide (DEMADEX) 20 MG tablet Take 1 tablet (20 mg total) by mouth 2 (two) times daily. 10/16/18   Theora Gianotti, NP  traMADol (ULTRAM) 50 MG tablet Take 1 tablet (50 mg total) by mouth every 8 (eight) hours as needed for moderate pain. 11/25/18   Karamalegos, [name], DO  warfarin (COUMADIN) 3 MG tablet Take 1 tablet (3 mg total) by mouth daily. Take as directed by the coumadin clinic. Patient taking differently: Take 3-4.5 mg by mouth See admin instructions. Take 3 mg at night on Sun, Tue, Wed, Thur, and Sat.  Take 4.5 mg at night on Mon and Fri.  04/17/18   End, [name], MD    Allergies Clopidogrel, Ciprofloxacin, Mirtazapine, and Spironolactone  Family History  Problem Relation Age of Onset  . Brain cancer Father   . Diabetes Sister   . Heart attack Sister     Social History Social History   Tobacco Use  . Smoking status: Former Smoker    Packs/day: 2.00    Years: 15.00    Pack years: 30.00    Types: Cigarettes    Quit date: 1979    Years since quitting: 41.7  . Smokeless tobacco: Former Systems developer  . Tobacco comment: quit 1979  Substance Use Topics  . Alcohol use: No    Comment: previously drank heavily - quit 1979.  . Drug use: No    Review of Systems  Constitutional: Negative for fever. Eyes: Negative for visual changes. ENT: Negative for sore throat. Neck: No neck pain  Cardiovascular: + chest pain. Respiratory: Negative for shortness of breath. Gastrointestinal: Negative for abdominal pain, vomiting or diarrhea. Genitourinary: Negative for dysuria. Musculoskeletal: Negative for back pain. Skin: Negative for rash. Neurological: Negative for headaches, weakness or numbness. Psych: No SI or HI  ____________________________________________   PHYSICAL EXAM:  VITAL SIGNS: ED Triage Vitals  Enc Vitals Group     BP 12/15/18 0317 135/88     Pulse Rate 12/15/18 0317 100     Resp 12/15/18 0317 16     Temp 12/15/18 0317 98.8 F (37.1 C)     Temp Source 12/15/18 0317 Oral     SpO2 12/15/18 0317 98 %     Weight 12/15/18 0318 198 lb (89.8 kg)     Height 12/15/18 0318 '5\' 4"'  (1.626 m)     Head Circumference --      Peak Flow --      Pain Score 12/15/18 0318 0     Pain Loc --      Pain Edu? --      Excl. in [place]? --     Constitutional: Alert and oriented. Well appearing and in no apparent distress. HEENT:      Head: Normocephalic and atraumatic.  Eyes: Conjunctivae are normal. Sclera is non-icteric.       Mouth/Throat: Mucous membranes are moist.       Neck: Supple with no signs of  meningismus. Cardiovascular: Regular rate and rhythm. No murmurs, gallops, or rubs. 2+ symmetrical distal pulses are present in all extremities. No JVD. Respiratory: Normal respiratory effort. Lungs are clear to auscultation bilaterally. No wheezes, crackles, or rhonchi.  Gastrointestinal: Soft, non tender, and non distended with positive bowel sounds. No rebound or guarding. Musculoskeletal: Nontender with normal range of motion in all extremities. No edema, cyanosis, or erythema of extremities. Neurologic: Normal speech and language. Face is symmetric. Moving all extremities. No gross focal neurologic deficits are appreciated. Skin: Skin is warm, dry and intact. No rash noted. Psychiatric: Mood and affect are normal. Speech and behavior are normal.  ____________________________________________   LABS (all labs ordered are listed, but only abnormal results are displayed)  Labs Reviewed  CBC WITH DIFFERENTIAL/PLATELET - Abnormal; Notable for the following components:      Result Value   Hemoglobin 11.7 (*)    HCT 36.4 (*)    RDW 16.5 (*)    All other components within normal limits  BASIC METABOLIC PANEL - Abnormal; Notable for the following components:   Sodium 134 (*)    Glucose, Bld 235 (*)    BUN 24 (*)    Creatinine, Ser 1.73 (*)    GFR calc non Af Amer 36 (*)    GFR calc Af Amer 41 (*)    All other components within normal limits  PROTIME-INR - Abnormal; Notable for the following components:   Prothrombin Time 19.7 (*)    INR 1.7 (*)    All other components within normal limits  TROPONIN I (HIGH SENSITIVITY)  TROPONIN I (HIGH SENSITIVITY)   ____________________________________________  EKG  ED ECG REPORT I, Rudene Re, the attending physician, personally viewed and interpreted this ECG.  Sinus tachycardia frequent PVCs, rate of 95, right bundle branch block, L LAFB, borderline prolonged QTC, left axis deviation, inferior Q waves, no ST elevations or  depressions.  Unchanged from prior ____________________________________________  RADIOLOGY  I have personally reviewed the images performed during this visit and I agree with the Radiologist's read.   Interpretation by Radiologist:  Dg Chest Portable 1 View  Result Date: 12/15/2018 CLINICAL DATA:  Chest pain, prior cardiac catheterization and stenting EXAM: PORTABLE CHEST 1 VIEW COMPARISON:  Chest radiograph 11/27/2018 FINDINGS: Streaky basilar opacities are present. No pneumothorax. No visible effusion. Prominence of the cardiac silhouette, possibly related to the portable technique. The aorta is calcified. The remaining cardiomediastinal contours are unremarkable. Degenerative changes are present in the imaged spine and shoulders. High-riding appearance of the humeral heads likely reflects rotator cuff insufficiency. No acute osseous or soft tissue abnormality. IMPRESSION: 1. Streaky basilar opacities, favored to reflect atelectasis though early infection could have a similar appearance. 2. Prominence of the cardiac silhouette, possibly related to the portable technique. Electronically Signed   By: Lovena Le M.D.   On: 12/15/2018 03:42     ____________________________________________   PROCEDURES  Procedure(s) performed: None Procedures Critical Care performed:  None ____________________________________________   INITIAL IMPRESSION / ASSESSMENT AND PLAN / ED COURSE  85 y.o. male with a history of severe CAD status post several stents, CHF with a EF of 35 to 40%, chronic kidney disease, CML, anemia, hypertension, hyperlipidemia, paroxysmal atrial fibrillation on Coumadin, diabetes who presents for evaluation of chest pain.    DDX ACS vs CHF vs  angina vs GERD vs PE although less likely with full resolution of symptoms.  EKG with no acute changes. Will monitor closely on telemetry. Will cycle troponins, get CXR, and get basic labs. Will give full ASA  Clinical Course as of Dec 15 634  Tue Dec 15, 2018  0623 HS troponin x 2 negative. Due to history of severe CAD, I recommended admission but patient wishes to go home and follow up with Dr. Saunders Revel. I recommended that he returns to the ER if the pain returns and also told him and his wife to call Dr. Darnelle Bos office first thing in the am for a follow up appointment. Wife is asking for a laxative as patient has been constipated. No abdominal pain, or vomiting. Patient is passing flatus. Will give senakot.    [CV]    Clinical Course User Index [CV] Alfred Levins [place], MD      As part of my medical decision making, I reviewed the following data within the [place] History obtained from family, Nursing notes reviewed and incorporated, Labs reviewed , EKG interpreted , Old EKG reviewed, Old chart reviewed, Radiograph reviewed , Notes from prior ED visits and [place] Controlled Substance Database   Patient was evaluated in Emergency Department today for the symptoms described in the history of present illness. Patient was evaluated in the context of the global COVID-19 pandemic, which necessitated consideration that the patient might be at risk for infection with the SARS-CoV-2 virus that causes COVID-19. Institutional protocols and algorithms that pertain to the evaluation of patients at risk for COVID-19 are in a state of rapid change based on information released by regulatory bodies including the CDC and federal and state organizations. These policies and algorithms were followed during the patient's care in the ED.   ____________________________________________   FINAL CLINICAL IMPRESSION(S) / ED DIAGNOSES   Final diagnoses:  Stable angina ([place])      NEW MEDICATIONS STARTED DURING THIS VISIT:  ED Discharge Orders         Ordered    senna (SENOKOT) 8.6 MG TABS tablet  Daily at bedtime     12/15/18 0636           Note:  This document was prepared using Dragon voice recognition software and may  include unintentional dictation errors.    Alfred Levins, [place], MD 12/15/18 [phone]

## 2018-12-15 NOTE — ED Notes (Signed)
ED Provider at bedside. 

## 2018-12-16 ENCOUNTER — Other Ambulatory Visit: Payer: Self-pay | Admitting: Pharmacy Technician

## 2018-12-16 NOTE — Patient Outreach (Signed)
[facility]) Care Management  12/16/2018  Lucas Caldwell [birth year] [id number]  Successful outreach call placed to patient in regards to [facility] application for Spiriva Respimat.  Spoke to patient's wife, Lucas Caldwell. HIPAA identifiers verified.  [name] informed she had received his application. She was able to answer the questions while I was on the phone. She informed she was aware of the 2 spots that [name] would need to sign and was appreciative of the orange stars indicating the information that was needed. She informed she would place in the mail today after [name] signed the application.  Will followup with patient in 10-14 business days if application has not been received back.  Wakeelah Solan P. Antwann Preziosi, [place] Management [phone]

## 2018-12-17 ENCOUNTER — Telehealth: Payer: Self-pay | Admitting: Family Medicine

## 2018-12-17 ENCOUNTER — Other Ambulatory Visit: Payer: Self-pay | Admitting: Pharmacy Technician

## 2018-12-17 NOTE — Telephone Encounter (Signed)
Pt wife called requesting refill on  Senna 8.6 mg. Pt have not had a BM after taken this pill. Pt wife is requesting a call back when Medication is called in [phone].

## 2018-12-17 NOTE — Telephone Encounter (Signed)
Left message with him, he wants his wife to reply for this message.

## 2018-12-17 NOTE — Telephone Encounter (Signed)
Please triage BM pattern. LBM date, symptoms.  I can provide refill, but if not effective patient may need something else.

## 2018-12-17 NOTE — Telephone Encounter (Signed)
I spoke with the patient and his wife. He complains of difficulty with bowel movement. He state it had been a week since he had a bm until this morning but it was hard small balls. He denies any abdominal discomfort, but admits that he had to strain to get I out this morning. His wife states that he's been taking Senna 8.6 Mg since Monday, but it's not helping.

## 2018-12-17 NOTE — Patient Outreach (Signed)
[facility]) Care Management  12/17/2018  CHIBUIKE FLEEK 31-Dec-1933 [id number]    Received all necessary documents and signatures from both patient and provider for [facility] patient assistance for Spiriva Respimat.  Submitted completed application via fax to [facility].  Will followup with BI in 5-10 business days to inquire on status of application.  Shondale Quinley P. Burlene Montecalvo, [place] Management [phone]

## 2018-12-17 NOTE — Telephone Encounter (Signed)
He has had a BM today. Continue Senna for another 1-2 days.  If continuing to have daily BM, will not need another medication.   - If not having BM, START miralax 1 dose daily to soften stool

## 2018-12-18 NOTE — Progress Notes (Deleted)
Follow-up Outpatient Visit Date: 12/21/2018  Primary Care Provider: [name], DO [address] [address]  Chief Complaint: ***  HPI:  Lucas Caldwell is a 85 y.o. year-old male with history of coronary artery disease, mixed ischemic and nonischemic cardiomyopathy, hypertension, hyperlipidemia, type 2 diabetes mellitus, paroxysmal atrial fibrillation, chronic kidney disease stage III, CML, iron deficiency anemia,GI bleed due to duodenal ulcers,BPH, and hypothyroidism, who presents for follow-up of chest pain.  I last saw him in late August, at which time he reported having progressive shortness of breath despite escalation of isosorbide mononitrate (dyspnea has been his anginal equivalent in the past).  We discussed PCI to the LAD (though the vessel is diffusely disease with multiple moderately severe lesions in the LMCA and LAD) versus medical therapy and pulmonary evaluation; he chose the latter.  Ranolazine 500 mg BIG was added to his regimen.  He presented to the [facility] ED with pain that woke him up on 12/15/2018.  It resolved after taking SL NTG x 2.  High-sensitivity troponin I was negative x 2.  --------------------------------------------------------------------------------------------------  Past Medical History:  Diagnosis Date  . Arthritis    "probably in his legs" (08/15/2016)  . Blind right eye   . BPH (benign prostatic hyperplasia)   . Breast asymmetry    Left breast is larger, present for several years.  [name] CAD (coronary artery disease)    a. Cath in the late 90's - reportedly ok;  b. 2014 s/p stenting x 2 @ [facility]; c 08/19/16 Cath/PCI with DES -> RCA, plan to treat LM 70% medically. Seen by surgery and felt to be too high risk for CABG; d. 08/2018 Cath: LM 70 (iFR 0.86), LAD 20ost, 40p, 50/38m, D1 70, LCX patent stent, OM1 30, RCA patent stent, 62m ISR, 40d, RPDA 30. PCWP 8. CO/CI 4.0/2.1.  [name] Chronic combined systolic (congestive) and diastolic (congestive) heart  failure ([place])    a. Previously reduced EF-->50% by echo in 2012;  b. 06/2015 Echo: EF 50-55%  c. 07/2016 Echo: EF 45-50%; d. 12/2017 Echo: EF 55%, Gr1 DD, mild MR; e. 08/2018 Echo: EF 35-40%, mildly dil PA.  [name] Chronic kidney disease (CKD), stage IV (severe) ([place])   . CML (chronic myelocytic leukemia) ([place])   . GERD (gastroesophageal reflux disease)   . GIB (gastrointestinal bleeding)    a. 12/2017 3 unit PRBC GIB in setting of coumadin-->Endo/colon multiple duodenal ulcers and a single bleeding ulcer in the proximal ascending colon status post hemostatic clipping x2.  . Gout   . Hyperlipemia   . Hypertension   . Hypothyroidism   . Iron deficiency anemia   . Ischemic cardiomyopathy    a. Previously reduced EF-->50% by echo in 2012;  b. 06/2015 Echo: EF 50-55%, Gr2 DD, mild MR, mildly dil LA, Ao sclerosis, mild TR;  c. 07/2016 Echo: EF 45-50%; d. 12/2017 Echo: EF 55%, Gr1 DD, mild MR; e. 08/2018 Echo: EF 35-40%.  . Migraine    "in the 1960s" (08/15/2016)  . PAF (paroxysmal atrial fibrillation) (HCC)    a. ? Dx 2014-->s/p DCCV;  b. CHA2DS2VASc = 6--> Coumadin.  . Prostate cancer ([place])   . RBBB   . Type II diabetes mellitus ([place])    Past Surgical History:  Procedure Laterality Date  . CATARACT EXTRACTION W/ INTRAOCULAR LENS  IMPLANT, BILATERAL Bilateral   . COLONOSCOPY N/A 01/13/2018   Procedure: COLONOSCOPY;  Surgeon: Lin Landsman, MD;  Location: [facility] ENDOSCOPY;  Service: Gastroenterology;  Laterality: N/A;  .  COLONOSCOPY WITH PROPOFOL N/A 01/01/2018   Procedure: COLONOSCOPY WITH PROPOFOL;  Surgeon: Lin Landsman, MD;  Location: [facility] ENDOSCOPY;  Service: Gastroenterology;  Laterality: N/A;  . CORONARY ANGIOPLASTY WITH STENT PLACEMENT  09/2012   2 stents  . CORONARY STENT INTERVENTION N/A 08/19/2016   Procedure: Coronary Stent Intervention;  Surgeon: [name], MD;  Location: [place] CV LAB;  Service: Cardiovascular;  Laterality: N/A;  . ESOPHAGEAL MANOMETRY N/A 12/16/2017    Procedure: ESOPHAGEAL MANOMETRY (EM);  Surgeon: Lin Landsman, MD;  Location: [facility] ENDOSCOPY;  Service: Gastroenterology;  Laterality: N/A;  . ESOPHAGOGASTRODUODENOSCOPY N/A 12/20/2016   Procedure: ESOPHAGOGASTRODUODENOSCOPY (EGD);  Surgeon: Lin Landsman, MD;  Location: [facility] ENDOSCOPY;  Service: Gastroenterology;  Laterality: N/A;  . ESOPHAGOGASTRODUODENOSCOPY N/A 01/13/2018   Procedure: ESOPHAGOGASTRODUODENOSCOPY (EGD);  Surgeon: Lin Landsman, MD;  Location: [facility] ENDOSCOPY;  Service: Gastroenterology;  Laterality: N/A;  . ESOPHAGOGASTRODUODENOSCOPY (EGD) WITH PROPOFOL N/A 11/03/2017   Procedure: ESOPHAGOGASTRODUODENOSCOPY (EGD) WITH PROPOFOL;  Surgeon: Lin Landsman, MD;  Location: [facility] ENDOSCOPY;  Service: Gastroenterology;  Laterality: N/A;  . EYE SURGERY    . HERNIA REPAIR     "navel"  . LEFT HEART CATH AND CORONARY ANGIOGRAPHY N/A 08/14/2016   Procedure: Left Heart Cath and Coronary Angiography;  Surgeon: [name], MD;  Location: [place] CV LAB;  Service: Cardiovascular;  Laterality: N/A;  . RIGHT/LEFT HEART CATH AND CORONARY ANGIOGRAPHY N/A 09/01/2018   Procedure: RIGHT/LEFT HEART CATH AND CORONARY ANGIOGRAPHY;  Surgeon: [name], MD;  Location: [place] CV LAB;  Service: Cardiovascular;  Laterality: N/A;  . TOE AMPUTATION Right    "big toe"   Recent CV Pertinent Labs: Lab Results  Component Value Date   CHOL 110 08/28/2018   HDL 28 (L) 08/28/2018   LDLCALC 51 08/28/2018   TRIG 155 (H) 08/28/2018   CHOLHDL 3.9 08/28/2018   INR 1.7 (H) 12/15/2018   INR 2.5 05/17/2012   BNP 83.0 06/03/2018   BNP 33.0 12/18/2016   K 3.7 12/15/2018   K 4.2 05/18/2012   MG 1.7 01/11/2018   BUN 24 (H) 12/15/2018   BUN 24 09/11/2018   BUN 38 (H) 05/18/2012   CREATININE 1.73 (H) 12/15/2018   CREATININE 1.26 05/18/2012    Past medical and surgical history were reviewed and updated in EPIC.  No outpatient medications have been marked as taking  for the 12/21/18 encounter (Appointment) with Lucas Caldwell, [name], MD.    Allergies: Clopidogrel, Ciprofloxacin, Mirtazapine, and Spironolactone  Social History   Tobacco Use  . Smoking status: Former Smoker    Packs/day: 2.00    Years: 15.00    Pack years: 30.00    Types: Cigarettes    Quit date: 1979    Years since quitting: 41.7  . Smokeless tobacco: Former Systems developer  . Tobacco comment: quit 1979  Substance Use Topics  . Alcohol use: No    Comment: previously drank heavily - quit 1979.  . Drug use: No    Family History  Problem Relation Age of Onset  . Brain cancer Father   . Diabetes Sister   . Heart attack Sister     Review of Systems: A 12-system review of systems was performed and was negative except as noted in the HPI.  --------------------------------------------------------------------------------------------------  Physical Exam: There were no vitals taken for this visit.  General:  *** HEENT: No conjunctival pallor or scleral icterus. Moist mucous membranes.  OP clear. Neck: Supple without lymphadenopathy, thyromegaly, JVD, or HJR. No carotid bruit. Lungs: Normal  work of breathing. Clear to auscultation bilaterally without wheezes or crackles. Heart: Regular rate and rhythm without murmurs, rubs, or gallops. Non-displaced PMI. Abd: Bowel sounds present. Soft, NT/ND without hepatosplenomegaly Ext: No lower extremity edema. Radial, PT, and DP pulses are 2+ bilaterally. Skin: Warm and dry without rash.  EKG:  ***  Lab Results  Component Value Date   WBC 10.2 12/15/2018   HGB 11.7 (L) 12/15/2018   HCT 36.4 (L) 12/15/2018   MCV 84.5 12/15/2018   PLT 172 12/15/2018    Lab Results  Component Value Date   NA 134 (L) 12/15/2018   K 3.7 12/15/2018   CL 100 12/15/2018   CO2 24 12/15/2018   BUN 24 (H) 12/15/2018   CREATININE 1.73 (H) 12/15/2018   GLUCOSE 235 (H) 12/15/2018   ALT 14 08/28/2018    Lab Results  Component Value Date   CHOL 110 08/28/2018    HDL 28 (L) 08/28/2018   LDLCALC 51 08/28/2018   TRIG 155 (H) 08/28/2018   CHOLHDL 3.9 08/28/2018    --------------------------------------------------------------------------------------------------  ASSESSMENT AND PLAN: [name], MD 12/18/2018 5:08 PM

## 2018-12-21 ENCOUNTER — Ambulatory Visit: Payer: Medicare HMO | Admitting: Internal Medicine

## 2018-12-22 ENCOUNTER — Telehealth: Payer: Medicare HMO

## 2018-12-22 NOTE — Progress Notes (Signed)
Follow-up Outpatient Visit Date: 12/23/2018  Primary Care Provider: [name], DO [address] [place] [address]  Chief Complaint: Shortness of breath and chest pain  HPI:  Mr. Banghart is a 85 y.o. year-old male with history of coronary artery disease, mixed ischemic and nonischemic cardiomyopathy, hypertension, hyperlipidemia, type 2 diabetes mellitus, paroxysmal atrial fibrillation, chronic kidney disease stage III, CML, iron deficiency anemia,GI bleed due to duodenal ulcers,BPH, and hypothyroidism, who presents for follow-up of chest pain.  I last saw him in late August, at which time he reported having progressive shortness of breath despite escalation of isosorbide mononitrate (dyspnea has been his anginal equivalent in the past).  We discussed PCI to the LAD (though the vessel is diffusely disease with multiple moderately severe lesions in the LMCA and LAD) versus medical therapy and pulmonary evaluation; he chose the latter.  Ranolazine 500 mg BIG was added to his regimen.  He presented to the [facility] ED with pain that woke him up on 12/15/2018.  It resolved after taking SL NTG x 2.  High-sensitivity troponin I was negative x 2.  Today, Mr. Stayer reports that he continues to have chest pain from time to time, though it seems less severe than what brought him to the ED last month.  He also describes an intermittent "biration" in his left chest that lasts 3-4 minutes and is sometimes associated with worsening shortness of breath.  He was recently started on Spiriva but has been unclear how to use the inhaler.  He was instructed on how to use it earlier this week and feels like it has helped his breathing somewhat.  He denies leg edema and orthopnea, as well as lightheadedness.  He remains on warfarin without bleeding.  He believes that a 4 pound weight gain over the last 6 weeks is due to dietary indiscretion and not fluid retention. [name]  --------------------------------------------------------------------------------------------------  Past Medical History:  Diagnosis Date  . Arthritis    "probably in his legs" (08/15/2016)  . Blind right eye   . BPH (benign prostatic hyperplasia)   . Breast asymmetry    Left breast is larger, present for several years.  [name] CAD (coronary artery disease)    a. Cath in the late 90's - reportedly ok;  b. 2014 s/p stenting x 2 @ [facility]; c 08/19/16 Cath/PCI with DES -> RCA, plan to treat LM 70% medically. Seen by surgery and felt to be too high risk for CABG; d. 08/2018 Cath: LM 70 (iFR 0.86), LAD 20ost, 40p, 50/45m D1 70, LCX patent stent, OM1 30, RCA patent stent, 465mSR, 40d, RPDA 30. PCWP 8. CO/CI 4.0/2.1.  . Marland Kitchenhronic combined systolic (congestive) and diastolic (congestive) heart failure ([place]   a. Previously reduced EF-->50% by echo in 2012;  b. 06/2015 Echo: EF 50-55%  c. 07/2016 Echo: EF 45-50%; d. 12/2017 Echo: EF 55%, Gr1 DD, mild MR; e. 08/2018 Echo: EF 35-40%, mildly dil PA.  . Marland Kitchenhronic kidney disease (CKD), stage IV (severe) ([place]  . CML (chronic myelocytic leukemia) ([place]  . GERD (gastroesophageal reflux disease)   . GIB (gastrointestinal bleeding)    a. 12/2017 3 unit PRBC GIB in setting of coumadin-->Endo/colon multiple duodenal ulcers and a single bleeding ulcer in the proximal ascending colon status post hemostatic clipping x2.  . Gout   . Hyperlipemia   . Hypertension   . Hypothyroidism   . Iron deficiency anemia   . Ischemic cardiomyopathy    a. Previously reduced  EF-->50% by echo in 2012;  b. 06/2015 Echo: EF 50-55%, Gr2 DD, mild MR, mildly dil LA, Ao sclerosis, mild TR;  c. 07/2016 Echo: EF 45-50%; d. 12/2017 Echo: EF 55%, Gr1 DD, mild MR; e. 08/2018 Echo: EF 35-40%.  . Migraine    "in the 1960s" (08/15/2016)  . PAF (paroxysmal atrial fibrillation) (HCC)    a. ? Dx 2014-->s/p DCCV;  b. CHA2DS2VASc = 6--> Coumadin.  . Prostate cancer ([place])   . RBBB   . Type II diabetes mellitus  ([place])    Past Surgical History:  Procedure Laterality Date  . CATARACT EXTRACTION W/ INTRAOCULAR LENS  IMPLANT, BILATERAL Bilateral   . COLONOSCOPY N/A 01/13/2018   Procedure: COLONOSCOPY;  Surgeon: Lin Landsman, MD;  [facility] ENDOSCOPY;  Service: Gastroenterology;  Laterality: N/A;  . COLONOSCOPY WITH PROPOFOL N/A 01/01/2018   Procedure: COLONOSCOPY WITH PROPOFOL;  Surgeon: Lin Landsman, MD;  Location: [facility] ENDOSCOPY;  Service: Gastroenterology;  Laterality: N/A;  . CORONARY ANGIOPLASTY WITH STENT PLACEMENT  09/2012   2 stents  . CORONARY STENT INTERVENTION N/A 08/19/2016   Procedure: Coronary Stent Intervention;  Surgeon: [name], MD;  Location: [place] CV LAB;  Service: Cardiovascular;  Laterality: N/A;  . ESOPHAGEAL MANOMETRY N/A 12/16/2017   Procedure: ESOPHAGEAL MANOMETRY (EM);  Surgeon: Lin Landsman, MD;  Location: [facility] ENDOSCOPY;  Service: Gastroenterology;  Laterality: N/A;  . ESOPHAGOGASTRODUODENOSCOPY N/A 12/20/2016   Procedure: ESOPHAGOGASTRODUODENOSCOPY (EGD);  Surgeon: Lin Landsman, MD;  Location: [facility] ENDOSCOPY;  Service: Gastroenterology;  Laterality: N/A;  . ESOPHAGOGASTRODUODENOSCOPY N/A 01/13/2018   Procedure: ESOPHAGOGASTRODUODENOSCOPY (EGD);  Surgeon: Lin Landsman, MD;  Location: [facility] ENDOSCOPY;  Service: Gastroenterology;  Laterality: N/A;  . ESOPHAGOGASTRODUODENOSCOPY (EGD) WITH PROPOFOL N/A 11/03/2017   Procedure: ESOPHAGOGASTRODUODENOSCOPY (EGD) WITH PROPOFOL;  Surgeon: Lin Landsman, MD;  Location: [facility] ENDOSCOPY;  Service: Gastroenterology;  Laterality: N/A;  . EYE SURGERY    . HERNIA REPAIR     "navel"  . LEFT HEART CATH AND CORONARY ANGIOGRAPHY N/A 08/14/2016   Procedure: Left Heart Cath and Coronary Angiography;  Surgeon: [name], MD;  Location: [place] CV LAB;  Service: Cardiovascular;  Laterality: N/A;  . RIGHT/LEFT HEART CATH AND CORONARY ANGIOGRAPHY N/A 09/01/2018   Procedure: RIGHT/LEFT  HEART CATH AND CORONARY ANGIOGRAPHY;  Surgeon: [name], MD;  Location: [place] CV LAB;  Service: Cardiovascular;  Laterality: N/A;  . TOE AMPUTATION Right    "big toe"   Recent CV Pertinent Labs: Lab Results  Component Value Date   CHOL 110 08/28/2018   HDL 28 (L) 08/28/2018   LDLCALC 51 08/28/2018   TRIG 155 (H) 08/28/2018   CHOLHDL 3.9 08/28/2018   INR 1.7 (H) 12/15/2018   INR 2.5 05/17/2012   BNP 83.0 06/03/2018   BNP 33.0 12/18/2016   K 3.7 12/15/2018   K 4.2 05/18/2012   MG 1.7 01/11/2018   BUN 24 (H) 12/15/2018   BUN 24 09/11/2018   BUN 38 (H) 05/18/2012   CREATININE 1.73 (H) 12/15/2018   CREATININE 1.26 05/18/2012    Past medical and surgical history were reviewed and updated in EPIC.  Current Meds  Medication Sig  . ACCU-CHEK AVIVA PLUS test strip Use to check blood sugar up to 2 times daily  . Accu-Chek Softclix Lancets lancets Use to check blood sugar up to 2 times daily.  [name] acetaminophen (TYLENOL) 500 MG tablet Take 1,000 mg by mouth every 6 (six) hours as needed for moderate pain or headache.   [name]  Alcohol Swabs 70 % PADS Use when checking blood sugar up to 3x daily  . allopurinol (ZYLOPRIM) 100 MG tablet TAKE 1 TABLET EVERY DAY  . Blood Glucose Monitoring Suppl (ACCU-CHEK AVIVA PLUS) w/Device KIT Use to check blood sugar up to 2 times daily  . bosutinib (BOSULIF) 100 MG tablet Take 100 mg by mouth at bedtime as needed (tired/not feeling well). Take with food.   . Dulaglutide (TRULICITY) 4.38 OI/7.5ZV SOPN Inject 0.75 mg into the skin once a week. (Patient taking differently: Inject 0.75 mg into the skin every Saturday. )  . enalapril (VASOTEC) 5 MG tablet TAKE ONE TABLET BY MOUTH EVERY DAY  . gabapentin (NEURONTIN) 300 MG capsule Take 2 capsules (600 mg total) by mouth 2 (two) times daily. Max dose = 1261m daily for kidney function  . Insulin Glargine ([name] KWIKPEN) 100 UNIT/ML SOPN Inject 60 Units into the skin daily.  . Insulin Pen Needle 31G  X 5 MM MISC 31 g by Does not apply route as directed.  . isosorbide mononitrate (IMDUR) 60 MG 24 hr tablet Take 1.5 tablets (90 mg total) by mouth daily.  .Marland Kitchenlevothyroxine (SYNTHROID) 25 MCG tablet TAKE 1 TABLET (25 MCG TOTAL) BY MOUTH DAILY BEFORE BREAKFAST.  .Marland Kitchenloperamide (IMODIUM A-D) 2 MG tablet Take 2 mg by mouth 3 (three) times daily as needed for diarrhea or loose stools.   . Menthol-Zinc Oxide (GOLD BOND EX) Apply 1 application topically daily as needed (muscle pain).  . metoprolol succinate (TOPROL XL) 25 MG 24 hr tablet Take 1 tablet (25 mg total) by mouth daily.  . Multiple Vitamins-Minerals (MULTIVITAMIN ADULTS 50+ PO) Take 1 tablet by mouth every morning.  . nitroGLYCERIN (NITROSTAT) 0.4 MG SL tablet Place 1 tablet (0.4 mg total) under the tongue every 5 (five) minutes x 3 doses as needed for chest pain.  .[name] (SYSTANE OP) Place 1 drop into both eyes daily as needed (dry eyes).  .Marland KitchenPROVENTIL HFA 108 (90 Base) MCG/ACT inhaler Inhale 2 puffs into the lungs every 6 (six) hours as needed for wheezing or shortness of breath.  . ranolazine (RANEXA) 500 MG 12 hr tablet Take 1 tablet (500 mg total) by mouth 2 (two) times daily.  . rosuvastatin (CRESTOR) 5 MG tablet Take 1 tablet (5 mg total) by mouth daily. (Patient taking differently: Take 5 mg by mouth at bedtime. )  . senna (SENOKOT) 8.6 MG TABS tablet Take 1 tablet (8.6 mg total) by mouth at bedtime for 10 days.  . Tiotropium Bromide Monohydrate (SPIRIVA RESPIMAT) 1.25 MCG/ACT AERS Inhale 2 puffs into the lungs daily.  .Marland Kitchentorsemide (DEMADEX) 20 MG tablet Take 1 tablet (20 mg total) by mouth 2 (two) times daily.  . traMADol (ULTRAM) 50 MG tablet Take 1 tablet (50 mg total) by mouth every 8 (eight) hours as needed for moderate pain.  . [DISCONTINUED] warfarin (COUMADIN) 3 MG tablet Take 1 tablet (3 mg total) by mouth daily. Take as directed by the coumadin clinic. (Patient taking differently: Take 3-4.5 mg by mouth See  admin instructions. Take 3 mg at night on Sun, Tue, Wed, Thur, and Sat.  Take 4.5 mg at night on Mon and Fri.)    Allergies: Clopidogrel, Ciprofloxacin, Mirtazapine, and Spironolactone  Social History   Tobacco Use  . Smoking status: Former Smoker    Packs/day: 2.00    Years: 15.00    Pack years: 30.00    Types: Cigarettes    Quit date: 1979  Years since quitting: 41.7  . Smokeless tobacco: Former Systems developer  . Tobacco comment: quit 1979  Substance Use Topics  . Alcohol use: No    Comment: previously drank heavily - quit 1979.  . Drug use: No    Family History  Problem Relation Age of Onset  . Brain cancer Father   . Diabetes Sister   . Heart attack Sister     Review of Systems: A 12-system review of systems was performed and was negative except as noted in the HPI.  --------------------------------------------------------------------------------------------------  Physical Exam: BP 124/64 (BP Location: Right Arm, Patient Position: Sitting, Cuff Size: Normal)   Pulse 87   Ht '5\' 4"'  (1.626 m)   Wt 206 lb (93.4 kg)   SpO2 97%   BMI 35.36 kg/m   General:  NAD.  Accompanied by his wife. HEENT: No conjunctival pallor or scleral icterus.  Neck: Supple without lymphadenopathy, thyromegaly, JVD, or HJR. Lungs: Normal work of breathing. Clear to auscultation bilaterally without wheezes or crackles. Heart: Regular rate and rhythm without murmurs, rubs, or gallops. Non-displaced PMI. Abd: Bowel sounds present. Soft, NT/ND without hepatosplenomegaly Ext: No lower extremity edema. Trace radial pulses bilaterally. Skin: Warm and dry without rash.  EKG:  NSR with RBBB and LAFB (bifascicular block).  No significant change from prior tracing.  Lab Results  Component Value Date   WBC 10.2 12/15/2018   HGB 11.7 (L) 12/15/2018   HCT 36.4 (L) 12/15/2018   MCV 84.5 12/15/2018   PLT 172 12/15/2018    Lab Results  Component Value Date   NA 134 (L) 12/15/2018   K 3.7 12/15/2018    CL 100 12/15/2018   CO2 24 12/15/2018   BUN 24 (H) 12/15/2018   CREATININE 1.73 (H) 12/15/2018   GLUCOSE 235 (H) 12/15/2018   ALT 14 08/28/2018    Lab Results  Component Value Date   CHOL 110 08/28/2018   HDL 28 (L) 08/28/2018   LDLCALC 51 08/28/2018   TRIG 155 (H) 08/28/2018   CHOLHDL 3.9 08/28/2018    --------------------------------------------------------------------------------------------------  ASSESSMENT AND PLAN: Coronary artery disease with stable angina: Chest continues and led to ED visit last month.  Interesting, Mr. Palmatier feels like both his chest pain and breathing are a little better.  Only interval intervention has been the addition of Spiriva (which he has had difficulty using until the last few days).  We discussed proceeding with PCI to the most significant lesion(s) involving the LAD (iFR positive on cath earlier this year but not intervened upon due to diffuse disease).  However, Mr. Fertig would like to wait another month to see if he has any further improvement with medical therapy.  We will continue his current regimen of metoprolol, isosorbide mononitrate, and ranolazine.  If symptoms have not improved at follow-up, we will need to reconsider PCI to the LAD.  Chronic systolic heart failure: Weight is up 4 pounds over the last 6 weeks but overall volume status appears relatively stable.  Recent creatinine was up to 1.7 from 1.3, which may reflect an element of intravascular volume depletion.  I have suggested that he cut down on his torsemide to 20 mg daily; he should return to BID dosing if he gains weight or develops leg swelling.  We will continue current doses of metoprolol succinate and enalapril  Hypertension: BP at goal.  Continue current medications.  Paroxysmal atrial fibrillation: EKG again shows sinus rhythm today.  I wonder if intermittent "vibrations" in the chest reflect PAF.  As the episodes are sporadic and self-limited, we will defer  further testing for now.  Continue warfarin and metoprolol.  Hyperlipidemia: LDL at goal.  Continue rosuvastatin.  Follow-up: Return to clinic in 1 month.[name] Hutchinson Isenberg, MD 12/23/2018 4:15 PM

## 2018-12-23 ENCOUNTER — Other Ambulatory Visit: Payer: Self-pay

## 2018-12-23 ENCOUNTER — Encounter: Payer: Self-pay | Admitting: Internal Medicine

## 2018-12-23 ENCOUNTER — Ambulatory Visit (INDEPENDENT_AMBULATORY_CARE_PROVIDER_SITE_OTHER): Payer: Medicare HMO | Admitting: Internal Medicine

## 2018-12-23 VITALS — BP 124/64 | HR 87 | Ht 64.0 in | Wt 206.0 lb

## 2018-12-23 DIAGNOSIS — I25118 Atherosclerotic heart disease of native coronary artery with other forms of angina pectoris: Secondary | ICD-10-CM | POA: Diagnosis not present

## 2018-12-23 DIAGNOSIS — I5022 Chronic systolic (congestive) heart failure: Secondary | ICD-10-CM

## 2018-12-23 DIAGNOSIS — E785 Hyperlipidemia, unspecified: Secondary | ICD-10-CM | POA: Diagnosis not present

## 2018-12-23 DIAGNOSIS — I480 Paroxysmal atrial fibrillation: Secondary | ICD-10-CM

## 2018-12-23 DIAGNOSIS — I10 Essential (primary) hypertension: Secondary | ICD-10-CM

## 2018-12-23 MED ORDER — NITROGLYCERIN 0.4 MG SL SUBL: 0.4000 mg | SUBLINGUAL_TABLET | SUBLINGUAL | 4 refills | Status: DC | PRN

## 2018-12-23 MED ORDER — WARFARIN SODIUM 3 MG PO TABS: 3.0000 mg | ORAL_TABLET | Freq: Every day | ORAL | 1 refills | Status: DC

## 2018-12-23 NOTE — Patient Instructions (Signed)
Medication Instructions:  Your physician recommends that you continue on your current medications as directed. Please refer to the Current Medication list given to you today.  If you need a refill on your cardiac medications before your next appointment, please call your pharmacy.   Lab work: NONE If you have labs (blood work) drawn today and your tests are completely normal, you will receive your results only by: [name] MyChart Message (if you have MyChart) OR . A paper copy in the mail If you have any lab test that is abnormal or we need to change your treatment, we will call you to review the results.  Testing/Procedures: NONE  Follow-Up: At [facility], you and your health needs are our priority.  As part of our continuing mission to provide you with exceptional heart care, we have created designated Provider Care Teams.  These Care Teams include your primary Cardiologist (physician) and Advanced Practice Providers (APPs -  Physician Assistants and Nurse Practitioners) who all work together to provide you with the care you need, when you need it. You will need a follow up appointment in 1 months.  You may see [name], MD or one of the following Advanced Practice Providers on your designated Care Team:   [name], NP [name], PA-C . [name], PA-C

## 2018-12-24 ENCOUNTER — Encounter: Payer: Self-pay | Admitting: Internal Medicine

## 2018-12-28 ENCOUNTER — Ambulatory Visit (INDEPENDENT_AMBULATORY_CARE_PROVIDER_SITE_OTHER): Payer: Medicare HMO | Admitting: Pharmacist

## 2018-12-28 ENCOUNTER — Telehealth: Payer: Self-pay | Admitting: *Deleted

## 2018-12-28 ENCOUNTER — Other Ambulatory Visit: Payer: Self-pay | Admitting: Pharmacy Technician

## 2018-12-28 ENCOUNTER — Ambulatory Visit: Payer: Self-pay | Admitting: *Deleted

## 2018-12-28 DIAGNOSIS — I25118 Atherosclerotic heart disease of native coronary artery with other forms of angina pectoris: Secondary | ICD-10-CM

## 2018-12-28 DIAGNOSIS — R0602 Shortness of breath: Secondary | ICD-10-CM

## 2018-12-28 DIAGNOSIS — K5900 Constipation, unspecified: Secondary | ICD-10-CM

## 2018-12-28 MED ORDER — TORSEMIDE 20 MG PO TABS: 20.0000 mg | ORAL_TABLET | Freq: Every day | ORAL | 3 refills | Status: DC

## 2018-12-28 NOTE — Chronic Care Management (AMB) (Signed)
Chronic Care Management   Follow Up Note   12/28/2018 Name: Lucas Caldwell MRN: [id number] DOB: [birth year]  Referred by: [name], DO Reason for referral : Chronic Care Management (Constipation)   Lucas Caldwell is a 85 y.o. year old male who is a primary care patient of [name], DO. The CCM team was consulted for assistance with chronic disease management and care coordination needs.    Review of patient status, including review of consultants reports, relevant laboratory and other test results, and collaboration with appropriate care team members and the patient's provider was performed as part of comprehensive patient evaluation and provision of chronic care management services.    SDOH (Social Determinants of Health) screening performed today: None. See Care Plan for related entries.   Advanced Directives Status: N See Care Plan and [place] application for related entries.  Outpatient Encounter Medications as of 12/28/2018  Medication Sig Note  . ACCU-CHEK AVIVA PLUS test strip Use to check blood sugar up to 2 times daily   . Accu-Chek Softclix Lancets lancets Use to check blood sugar up to 2 times daily.   [name] acetaminophen (TYLENOL) 500 MG tablet Take 1,000 mg by mouth every 6 (six) hours as needed for moderate pain or headache.    . Alcohol Swabs 70 % PADS Use when checking blood sugar up to 3x daily   . allopurinol (ZYLOPRIM) 100 MG tablet TAKE 1 TABLET EVERY DAY   . Blood Glucose Monitoring Suppl (ACCU-CHEK AVIVA PLUS) w/Device KIT Use to check blood sugar up to 2 times daily   . bosutinib (BOSULIF) 100 MG tablet Take 100 mg by mouth at bedtime as needed (tired/not feeling well). Take with food.  08/31/2018: Pt's wife states he only takes this as needed when he's "not feeling well"  . Dulaglutide (TRULICITY) 4.43 XV/4.0GQ SOPN Inject 0.75 mg into the skin once a week. (Patient taking differently: Inject 0.75 mg into the skin every Saturday. )    . enalapril (VASOTEC) 5 MG tablet TAKE ONE TABLET BY MOUTH EVERY DAY   . gabapentin (NEURONTIN) 300 MG capsule Take 2 capsules (600 mg total) by mouth 2 (two) times daily. Max dose = 1264m daily for kidney function   . Insulin Glargine ([name] KWIKPEN) 100 UNIT/ML SOPN Inject 60 Units into the skin daily.   . Insulin Pen Needle 31G X 5 MM MISC 31 g by Does not apply route as directed.   . isosorbide mononitrate (IMDUR) 60 MG 24 hr tablet Take 1.5 tablets (90 mg total) by mouth daily.   .Marland Kitchenlevothyroxine (SYNTHROID) 25 MCG tablet TAKE 1 TABLET (25 MCG TOTAL) BY MOUTH DAILY BEFORE BREAKFAST.   .Marland Kitchenloperamide (IMODIUM A-D) 2 MG tablet Take 2 mg by mouth 3 (three) times daily as needed for diarrhea or loose stools.    . Menthol-Zinc Oxide (GOLD BOND EX) Apply 1 application topically daily as needed (muscle pain).   . metoprolol succinate (TOPROL XL) 25 MG 24 hr tablet Take 1 tablet (25 mg total) by mouth daily.   . Multiple Vitamins-Minerals (MULTIVITAMIN ADULTS 50+ PO) Take 1 tablet by mouth every morning.   . nitroGLYCERIN (NITROSTAT) 0.4 MG SL tablet Place 1 tablet (0.4 mg total) under the tongue every 5 (five) minutes x 3 doses as needed for chest pain.   .Marland Kitchenomeprazole (PRILOSEC) 40 MG capsule Take 1 capsule (40 mg total) by mouth 2 (two) times daily. (Patient not taking: Reported on 12/23/2018)   . Polyethyl Glycol-Propyl Glycol (  SYSTANE OP) Place 1 drop into both eyes daily as needed (dry eyes).   . polyethylene glycol (MIRALAX / GLYCOLAX) 17 g packet Take 17 g by mouth daily.   [name] PROVENTIL HFA 108 (90 Base) MCG/ACT inhaler Inhale 2 puffs into the lungs every 6 (six) hours as needed for wheezing or shortness of breath.   . ranolazine (RANEXA) 500 MG 12 hr tablet Take 1 tablet (500 mg total) by mouth 2 (two) times daily.   . rosuvastatin (CRESTOR) 5 MG tablet Take 1 tablet (5 mg total) by mouth daily. (Patient taking differently: Take 5 mg by mouth at bedtime. )   . senna (SENOKOT) 8.6 MG tablet Take  1 tablet by mouth daily as needed for constipation.   . Tiotropium Bromide Monohydrate (SPIRIVA RESPIMAT) 1.25 MCG/ACT AERS Inhale 2 puffs into the lungs daily.   [name] torsemide (DEMADEX) 20 MG tablet Take 1 tablet (20 mg total) by mouth daily. Increase to 20 mg two times a day for swelling or weight gain.   [name] traMADol (ULTRAM) 50 MG tablet Take 1 tablet (50 mg total) by mouth every 8 (eight) hours as needed for moderate pain.   [name] warfarin (COUMADIN) 3 MG tablet Take 1 tablet (3 mg total) by mouth daily. Take as directed by the coumadin clinic.    No facility-administered encounter medications on file as of 12/28/2018.      Goals Addressed            This Visit's Progress   . I am very constipated (pt-stated)       Current Barriers:  [name] Knowledge Deficits related to constipation  Nurse Case Manager Clinical Goal(s):  [name] Over the next 30 days, patient will work with [facility] to address needs related to continued constipation  Interventions:  . Evaluation of current treatment plan related to constipation and patient's adherence to plan as established by provider. . Advised patient to to start daily doses of Miralax as advised by NP. Advised him to continue to use this daily until he had good results then may be able to go to half a cap per day.  . Discussed plans with patient for ongoing care management follow up and provided patient with direct contact information for care management team . Plan to send patient and spouse written education on how to reduce constipation   Patient Self Care Activities:  . Performs ADL's independently . Calls provider office for new concerns or questions . reinforcement of given medical advice to assist with constipation  Initial goal documentation         The care management team will reach out to the patient again over the next 30 days.  The patient has been provided with contact information for the care management team and has been advised to call with  any health related questions or concerns.    Merlene Morse Kodah Maret RN, BSN Nurse Case Pharmacist, [facility]  [phone]) Business Mobile

## 2018-12-28 NOTE — Patient Instructions (Signed)
Thank you allowing the Chronic Care Management Team to be a part of your care! It was a pleasure speaking with you today!     CCM (Chronic Care Management) Team    Janci Minor RN, BSN Nurse Care Coordinator  [phone]   [name] PharmD  Clinical Pharmacist  [phone]   Eula Fried LCSW Clinical Social Worker [phone]  Visit Information  Goals Addressed            This Visit's Progress   . PharmD - Medication Managment       Current Barriers:  . Financial - reports copayment for Spiriva Respimat inhaler through patient's Part D coverage is not affordable o Note patient has Limited Partial Extra Help Subsidy . Limited vision . Chronic Disease Management support and education needs related to multiple chronic disease processes  Pharmacist Clinical Goal(s):  [name] Over the next 30 days, patient will work with CM Pharmacist to address needs related to medication adherence and diabetes management  Interventions: . Perform chart review . Follow up with Mr and Mrs. Balgobin regarding constipation o Report Mr. Kapur continues to have small and hard BMs. Reports having last BM today. Prior to today, most recent on Saturday. o Reports patient tired and uncomfortable, but denies patient having chest pain or shortness of breath o Reports that he has continued Senna daily. However, only taken Miralax twice over past 2 weeks, once last week and once today. o Reiterate instruction from provider to start using Miralax 1 dose daily to soften stool. . Place coordination of care call to CM Nurse Case Manager. Request follow up with patient regarding his constipation . Mrs. Yom confirms patient is taking torsemide as directed by Cardiologist, "currently torsemide to 20 mg daily, but that he may return to BID dosing if he gains weight or develops leg swelling."  Assess adherence to rosuvastatin (as identified by health plan).  . Continue to collaborate with [facility] CPhT  to assist patient with applying for patient assistance for Spiriva Respimat through [place] message from [place] regarding patient assistance application for Spiriva.  - Patient needs to submit proof of partial extra help status and receipt from pharmacy for Spiriva inhaler for program enrollment extension. o Mrs Penza confirms receipt of 3 Spiriva inhalers from program o Mrs. Tinnel states that she has the documentation of patient's partial extra help from [employer]. States that she will bring a copy of this letter as well as the receipt for patient's last fill of Spiriva from pharmacy to the office tomorrow morning to be faxed to [place]. . Will collaborate with office to request that supporting documents from Mrs. Messler be faxed to Susy Frizzle, [facility] CPhT [phone]), when received from patient's wife   Patient Self Care Activities:  . Check blood sugar, blood pressure and daily weight as directed by providers  . Takes medications, with assistance of wife, as directed . Patient attends scheduled medical appointments o Next Cardiologist appointment scheduled for 11/6  Please see past updates related to this goal by clicking on the "Past Updates" button in the selected goal         The patient verbalized understanding of instructions provided today and declined a print copy of patient instruction materials.   The care management team will reach out to the patient again over the next 30 days.   [name], PharmD, [place] [employer] [phone]

## 2018-12-28 NOTE — Telephone Encounter (Signed)
Called and spoke with patient's wife, ok per DPR.  She verbalized understanding that she remembered Dr End saying for patient to take torsemide 20 mg once daily. She is aware patient can increase to 20 mg two times a day for swelling or weight gain. She states patient feels weak this morning. Advised her to call PCP if no improvement or additional symptoms arise.  She was appreciative. Med list updated.

## 2018-12-28 NOTE — Telephone Encounter (Signed)
-----   Message from [name], MD sent at 12/24/2018  8:38 PM EDT ----- Hi Jennifer,I mentioned to Mr. Vallee that he should cut down on torsemide to 20 mg daily due to recent bump in his creatinine.  However, if he develops edema or weight gain, he should go back to 20 mg BID.  I forgot to mention that to you to add to the AVS.  Would you mind reaching out to him to confirm these instructions?  Sorry for the trouble.  Thanks for your help!Gerald Stabs

## 2018-12-28 NOTE — Patient Outreach (Signed)
[facility]) Care Management  12/28/2018  KESTER STIMPSON Nov 05, 1933 [id number]   ADDENDUM  Successful outreach call placed to patient in regards to [facility] application for Spiriva.  Spoke to patient, who then passed the phone to his wife [place]. [name] informed this was not a good time and to call her back later.  Was calling to inquire if they received 3 Spiriva inhalers form [employer].  Will route note to [place]  For assistance.  Eadie Repetto P. Eleftherios Dudenhoeffer, [place] Management [phone]

## 2018-12-28 NOTE — Chronic Care Management (AMB) (Signed)
Chronic Care Management   Follow Up Note   12/28/2018 Name: Lucas Caldwell MRN: [id number] DOB: May 19, 1933  Referred by: Lucas Hauser, DO Reason for referral : Chronic Care Management (Patient Phone Call)   Lucas Caldwell is a 85 y.o. year old male who is a primary care patient of Lucas Hauser, DO. The CCM team was consulted for assistance with chronic disease management and care coordination needs. Lucas Caldwell has a past medical history including but not limited to type 2 diabetes, CKD, hyperlipidemia, COPD, atrial fibrillation, hypertension, coronary artery disease, Chronic HFpEF, hypothyroidism, and CML  I reached out to Lucas Caldwell and his wife by phone today.   Review of patient status, including review of consultants reports, relevant laboratory and other test results, and collaboration with appropriate care team members and the patient's provider was performed as part of comprehensive patient evaluation and provision of chronic care management services.     Outpatient Encounter Medications as of 12/28/2018  Medication Sig Note  . enalapril (VASOTEC) 5 MG tablet TAKE ONE TABLET BY MOUTH EVERY DAY   . isosorbide mononitrate (IMDUR) 60 MG 24 hr tablet Take 1.5 tablets (90 mg total) by mouth daily.   . metoprolol succinate (TOPROL XL) 25 MG 24 hr tablet Take 1 tablet (25 mg total) by mouth daily.   . polyethylene glycol (MIRALAX / GLYCOLAX) 17 g packet Take 17 g by mouth daily.   . ranolazine (RANEXA) 500 MG 12 hr tablet Take 1 tablet (500 mg total) by mouth 2 (two) times daily.   . rosuvastatin (CRESTOR) 5 MG tablet Take 1 tablet (5 mg total) by mouth daily. (Patient taking differently: Take 5 mg by mouth at bedtime. )   . senna (SENOKOT) 8.6 MG tablet Take 1 tablet by mouth daily as needed for constipation.   . torsemide (DEMADEX) 20 MG tablet Take 1 tablet (20 mg total) by mouth daily. Increase to 20 mg two times a day for swelling or weight gain.    [name] ACCU-CHEK AVIVA PLUS test strip Use to check blood sugar up to 2 times daily   . Accu-Chek Softclix Lancets lancets Use to check blood sugar up to 2 times daily.   [name] acetaminophen (TYLENOL) 500 MG tablet Take 1,000 mg by mouth every 6 (six) hours as needed for moderate pain or headache.    . Alcohol Swabs 70 % PADS Use when checking blood sugar up to 3x daily   . allopurinol (ZYLOPRIM) 100 MG tablet TAKE 1 TABLET EVERY DAY   . Blood Glucose Monitoring Suppl (ACCU-CHEK AVIVA PLUS) w/Device KIT Use to check blood sugar up to 2 times daily   . bosutinib (BOSULIF) 100 MG tablet Take 100 mg by mouth at bedtime as needed (tired/not feeling well). Take with food.  08/31/2018: Pt's wife states he only takes this as needed when he's "not feeling well"  . Dulaglutide (TRULICITY) 5.68 LE/7.5TZ SOPN Inject 0.75 mg into the skin once a week. (Patient taking differently: Inject 0.75 mg into the skin every Saturday. )   . gabapentin (NEURONTIN) 300 MG capsule Take 2 capsules (600 mg total) by mouth 2 (two) times daily. Max dose = 1277m daily for kidney function   . Insulin Glargine ([name] KWIKPEN) 100 UNIT/ML SOPN Inject 60 Units into the skin daily.   . Insulin Pen Needle 31G X 5 MM MISC 31 g by Does not apply route as directed.   .Marland Kitchenlevothyroxine (SYNTHROID) 25 MCG tablet TAKE 1 TABLET (  25 MCG TOTAL) BY MOUTH DAILY BEFORE BREAKFAST.   [name] loperamide (IMODIUM A-D) 2 MG tablet Take 2 mg by mouth 3 (three) times daily as needed for diarrhea or loose stools.    . Menthol-Zinc Oxide (GOLD BOND EX) Apply 1 application topically daily as needed (muscle pain).   . Multiple Vitamins-Minerals (MULTIVITAMIN ADULTS 50+ PO) Take 1 tablet by mouth every morning.   . nitroGLYCERIN (NITROSTAT) 0.4 MG SL tablet Place 1 tablet (0.4 mg total) under the tongue every 5 (five) minutes x 3 doses as needed for chest pain.   [name] omeprazole (PRILOSEC) 40 MG capsule Take 1 capsule (40 mg total) by mouth 2 (two) times daily. (Patient not  taking: Reported on 12/23/2018)   . Polyethyl Glycol-Propyl Glycol (SYSTANE OP) Place 1 drop into both eyes daily as needed (dry eyes).   [name] PROVENTIL HFA 108 (90 Base) MCG/ACT inhaler Inhale 2 puffs into the lungs every 6 (six) hours as needed for wheezing or shortness of breath.   . Tiotropium Bromide Monohydrate (SPIRIVA RESPIMAT) 1.25 MCG/ACT AERS Inhale 2 puffs into the lungs daily.   . traMADol (ULTRAM) 50 MG tablet Take 1 tablet (50 mg total) by mouth every 8 (eight) hours as needed for moderate pain.   [name] warfarin (COUMADIN) 3 MG tablet Take 1 tablet (3 mg total) by mouth daily. Take as directed by the coumadin clinic.    No facility-administered encounter medications on file as of 12/28/2018.     Goals Addressed            This Visit's Progress   . PharmD - Medication Managment       Current Barriers:  . Financial - reports copayment for Spiriva Respimat inhaler through patient's Part D coverage is not affordable o Note patient has Limited Partial Extra Help Subsidy . Limited vision . Chronic Disease Management support and education needs related to multiple chronic disease processes  Pharmacist Clinical Goal(s):  [name] Over the next 30 days, patient will work with CM Pharmacist to address needs related to medication adherence and diabetes management  Interventions: . Perform chart review o Patient seed in [facility] ED on 9/29 for chest pain o On 10/1, Lucas Caldwell contacted office about constipation, per NP, advised patient to "Continue Senna for another 1-2 days.  If continuing to have daily BM, will not need another medication. If not having BM, START miralax 1 dose daily to soften stool" o Patient had office visit with Cardiologist on 10/7 . Follow up with Lucas Caldwell regarding constipation o Report Lucas Caldwell continues to have small and hard BMs. Reports having last BM today. Prior to today, most recent on Saturday. o Reports patient tired and uncomfortable, but denies  patient having chest pain or shortness of breath o Reports that he has continued Senna daily. However, only taken Miralax twice over past 2 weeks, once last week and once today. o Reiterate instruction from provider from telephone note on 10/1, to start using Miralax 1 dose daily to soften stool. . Place coordination of care call to CM Nurse Case Manager. Request follow up with patient regarding his constipation . Lucas Caldwell confirms patient is taking torsemide as directed by Cardiologist, "currently torsemide to 20 mg daily, but that he may return to BID dosing if he gains weight or develops leg swelling."  Assess adherence to rosuvastatin (as identified by health plan).  ? Lucas Caldwell denies patient missing doses of rosuvasatin  ? Using weekly pillbox  ? Review  dispensing history in chart. No gaps in recent refill history noted. . Continue to collaborate with [facility] CPhT to assist patient with applying for patient assistance for Spiriva Respimat through [place] message from [place] regarding patient assistance application for Spiriva.  - Patient needs to submit proof of partial extra help status and receipt from pharmacy for Spiriva inhaler for program enrollment extension. o Mrs Caldwell confirms receipt of 3 Spiriva inhalers from program o Lucas Caldwell states that she has the documentation of patient's partial extra help from [employer]. States that she will bring a copy of this letter as well as the receipt for patient's last fill of Spiriva from pharmacy to the office tomorrow morning to be faxed to [place]. . Will collaborate with office to request that supporting documents from Lucas Caldwell be faxed to Lucas Caldwell, [facility] CPhT [phone]), when received from patient's wife   Patient Self Care Activities:  . Check blood sugar, blood pressure and daily weight as directed by providers  . Takes medications, with assistance of wife, as directed . Patient  attends scheduled medical appointments o Next Cardiologist appointment scheduled for 11/6  Please see past updates related to this goal by clicking on the "Past Updates" button in the selected goal         Plan  The care management team will reach out to the patient again over the next 30 days.   [name], PharmD, [place] [employer] [phone]

## 2018-12-28 NOTE — Patient Instructions (Signed)
Thank you allowing the Chronic Care Management Team to be a part of your care! It was a pleasure speaking with you today!   CCM (Chronic Care Management) Team   Lucas Zeimet RN, BSN Nurse Care Coordinator  [id number]  [name] PharmD  Clinical Pharmacist  [phone]  Eula Fried LCSW Clinical Social Worker [phone]  Goals Addressed            This Visit's Progress   . I am very constipated (pt-stated)       Current Barriers:  [name] Knowledge Deficits related to constipation  Nurse Case Manager Clinical Goal(s):  [name] Over the next 30 days, patient will work with [name] to address needs related to continued constipation  Interventions:  . Evaluation of current treatment plan related to constipation and patient's adherence to plan as established by provider. . Advised patient to to start daily doses of Miralax as advised by NP. Advised him to continue to use this daily until he had good results then may be able to go to half a cap per day.  . Discussed plans with patient for ongoing care management follow up and provided patient with direct contact information for care management team . Plan to send patient and spouse written education on how to reduce constipation   Patient Self Care Activities:  . Performs ADL's independently . Calls provider office for new concerns or questions . reinforcement of given medical advice to assist with constipation  Initial goal documentation        The patient verbalized understanding of instructions provided today and declined a print copy of patient instruction materials.   The patient has been provided with contact information for the care management team and has been advised to call with any health related questions or concerns.    Chronic Constipation  Chronic constipation is a condition in which a person has three or fewer bowel movements a week, for three months or longer. This condition is especially common in  older adults. The two main kinds of chronic constipation are secondary constipation and functional constipation. Secondary constipation results from another condition or a treatment. Functional constipation, also called primary or idiopathic constipation, is divided into three types:  Normal transit constipation. In this type, movement of stool through the colon (stool transit) occurs normally.  Slow transit constipation. In this type, stool moves slowly through the colon.  Outlet constipation or pelvic floor dysfunction. In this type, the nerves and muscles that empty the rectum do not work normally. What are the causes? Causes of secondary constipation may include:  Failing to drink enough fluid, eat enough food or fiber, or get physically active.  Pregnancy.  A tear in the anus (anal fissure).  Blockage in the bowel (bowel obstruction).  Narrowing of the bowel (bowel stricture).  Having a long-term medical condition, such as: ? Diabetes. ? Hypothyroidism. ? Multiple sclerosis. ? Parkinson disease. ? Stroke. ? Spinal cord injury. ? Dementia. ? Colon cancer. ? Inflammatory bowel disease (IBD). ? Iron-deficiency anemia. ? Outward collapse of the rectum (rectal prolapse). ? Hemorrhoids.  Taking certain medicines, including: ? Narcotics. These are a certain type of prescription pain medicine. ? Antacids. ? Iron supplements. ? Water pills (diuretics). ? Certain blood pressure medicines. ? Anti-seizure medicines. ? Antidepressants. ? Medicines for Parkinson disease. The cause of functional constipation is not known, but some conditions are associated with it. These conditions include:  Stress.  Problems in the nerves and muscles that control stool transit.  Weak or impaired pelvic floor muscles. What increases the risk? You may be at higher risk for chronic constipation if you:  Are older than age 42.  Are male.  Live in a long-term care facility.  Do not get  much exercise or physical activity (have a sedentary lifestyle).  Do not drink enough fluids.  Do not eat enough food, especially fiber.  Have a long-term disease.  Have a mental health disorder or eating disorder.  Take many medicines. What are the signs or symptoms? The main symptom of chronic constipation is having three or fewer bowel movements a week for several weeks. Other signs and symptoms may vary from person to person. These include:  Pushing hard (straining) to pass stool.  Painful bowel movements.  Having hard or lumpy stools.  Having lower belly discomfort, such as cramps or bloating.  Being unable to have a bowel movement when you feel the urge.  Feeling like you still need to pass stool after a bowel movement.  Feeling that you have something in your rectum that is blocking or preventing bowel movements.  Seeing blood on the toilet paper or in your stool.  Worsening confusion (in older adults). How is this diagnosed? This condition may be diagnosed based on:  Symptoms and medical history. You will be asked about your symptoms, lifestyle, diet, and any medicines that you are taking.  Physical exam. ? Your belly (abdomen) will be examined. ? A digital rectal exam may be done. For this exam, a health care provider places a lubricated, gloved finger into the rectum.  Other tests to check for any underlying causes of your constipation. These may be ordered if you have bleeding in your rectum, weight loss, or a family history of colon cancer. In these cases, you may have: ? Imaging studies of the colon. These may include X-ray, ultrasound, or CT scan. ? Blood tests. ? A procedure to examine the inside of your colon (colonoscopy). ? More specialized tests to check:  Whether your anal sphincter works well. This is a ring-shaped muscle that controls the closing of the anus.  How well food moves through your colon. ? Tests to measure the nerve signal in your  pelvic floor muscles (electromyography). How is this treated? Treatment for chronic constipation depends on the cause. Most often, treatment starts with:  Being more active and getting regular exercise.  Drinking more fluids.  Adding fiber to your diet. Sources of fiber include fruits, vegetables, whole grains, and fiber supplements.  Using medicines such as stool softeners or medicines that increase contractions in your digestive system (pro-motility agents).  Training your pelvic muscles with biofeedback.  Surgery, if there is obstruction. Treatment for secondary chronic constipation depends on the underlying condition. You may need to:  Stop or change some medicines if they cause constipation.  Use a fiber supplement (bulk laxative) or stool softener.  Use prescription laxative. This works by [employer] into your colon (osmotic laxative). You may also need to see a specialist who treats conditions of the digestive system (gastroenterologist). Follow these instructions at home:   Take over-the-counter and prescription medicines only as told by your health care provider.  If you are taking a laxative, take it as told by your health care provider.  Eat a balanced diet that includes enough fiber. Ask your health care provider to recommend a diet that is right for you.  Drink clear fluids, especially water. Avoid drinking alcohol, caffeine, and soda.  Drink enough fluid to  keep your urine pale yellow.  Get some physical activity every day. Ask your health care provider what physical activities are safe for you.  Get colon cancer screenings as told by your health care provider.  Keep all follow-up visits as told by your health care provider. This is important. Contact a health care provider if:  You are having three or fewer bowel movements a week.  Your stools are hard or lumpy.  You notice blood on the toilet paper or in your stool after you have a bowel movement.   You have unexplained weight loss.  You have rectum (rectal) pain.  You have stool leakage.  You experience nausea or vomiting. Get help right away if:  You have rectal bleeding or you pass blood clots.  You have severe rectal pain.  You have body tissue that pushes out (protrudes) from your anus.  You have severe pain or bloating (distension) in your abdomen.  You have vomiting that you cannot control. Summary  Chronic constipation is a condition in which a person has three or fewer bowel movements a week, for three months or longer.  You may have a higher risk for this condition if you are an older adult, or if you do not drink enough water or get enough physical activity (are sedentary).  Treatment for this condition depends on the cause. Most treatments for chronic constipation include adding fiber to your diet, drinking more fluids, and getting more physical activity. You may also need to treat any underlying medical conditions or stop or change certain medicines if they cause constipation.  If lifestyle changes do not relieve constipation, your health care provider may recommend taking a laxative. This information is not intended to replace advice given to you by your health care provider. Make sure you discuss any questions you have with your health care provider. Document Released: 10/01/2016 Document Revised: 02/14/2017 Document Reviewed: 11/19/2016 Elsevier Patient Education  2020 [employer].

## 2018-12-28 NOTE — Patient Outreach (Signed)
[facility]) Care Management  12/28/2018  Lucas Caldwell Jan 30, 1934 [id number]  Care coordination call placed to BI in regards to patient's application for Spiriva.  Spoke to [place] who informed patient had been approved temporarily due to patient needs to apply for LIS/Extra Help.  [place] informed patient was sent a 90 days supply for Spiriva and it was delivered yesterday 12/27/2018.  Will outreach embedded [place] for assistance with having patient apply for LIS/Extra Help and to see if patient received his medication.  Elea Holtzclaw P. Cyncere Ruhe, [place] Management [phone]

## 2018-12-29 ENCOUNTER — Ambulatory Visit: Payer: Self-pay | Admitting: Pharmacist

## 2018-12-29 DIAGNOSIS — J432 Centrilobular emphysema: Secondary | ICD-10-CM

## 2018-12-29 NOTE — Chronic Care Management (AMB) (Addendum)
Chronic Care Management   Follow Up Note   12/29/2018 Name: Lucas Caldwell MRN: [id number] DOB: 02-May-1933  Referred by: [name], DO Reason for referral : Care Coordination (Pharmacy Call)   Lucas Caldwell is a 85 y.o. year old male who is a primary care patient of [name], DO. The CCM team was consulted for assistance with chronic disease management and care coordination needs.    Coordination of care call to [facility] for supplemental documentation needed for Spiriva patient assistance application  Review of patient status, including review of consultants reports, relevant laboratory and other test results, and collaboration with appropriate care team members and the patient's provider was performed as part of comprehensive patient evaluation and provision of chronic care management services.     Outpatient Encounter Medications as of 12/29/2018  Medication Sig Note  . ACCU-CHEK AVIVA PLUS test strip Use to check blood sugar up to 2 times daily   . Accu-Chek Softclix Lancets lancets Use to check blood sugar up to 2 times daily.   [name] acetaminophen (TYLENOL) 500 MG tablet Take 1,000 mg by mouth every 6 (six) hours as needed for moderate pain or headache.    . Alcohol Swabs 70 % PADS Use when checking blood sugar up to 3x daily   . allopurinol (ZYLOPRIM) 100 MG tablet TAKE 1 TABLET EVERY DAY   . Blood Glucose Monitoring Suppl (ACCU-CHEK AVIVA PLUS) w/Device KIT Use to check blood sugar up to 2 times daily   . bosutinib (BOSULIF) 100 MG tablet Take 100 mg by mouth at bedtime as needed (tired/not feeling well). Take with food.  08/31/2018: Pt's wife states he only takes this as needed when he's "not feeling well"  . Dulaglutide (TRULICITY) 7.67 HA/1.9FX SOPN Inject 0.75 mg into the skin once a week. (Patient taking differently: Inject 0.75 mg into the skin every Saturday. )   . enalapril (VASOTEC) 5 MG tablet TAKE ONE TABLET BY MOUTH EVERY DAY   .  gabapentin (NEURONTIN) 300 MG capsule Take 2 capsules (600 mg total) by mouth 2 (two) times daily. Max dose = 1260m daily for kidney function   . Insulin Glargine ([name] KWIKPEN) 100 UNIT/ML SOPN Inject 60 Units into the skin daily.   . Insulin Pen Needle 31G X 5 MM MISC 31 g by Does not apply route as directed.   . isosorbide mononitrate (IMDUR) 60 MG 24 hr tablet Take 1.5 tablets (90 mg total) by mouth daily.   .Marland Kitchenlevothyroxine (SYNTHROID) 25 MCG tablet TAKE 1 TABLET (25 MCG TOTAL) BY MOUTH DAILY BEFORE BREAKFAST.   .Marland Kitchenloperamide (IMODIUM A-D) 2 MG tablet Take 2 mg by mouth 3 (three) times daily as needed for diarrhea or loose stools.    . Menthol-Zinc Oxide (GOLD BOND EX) Apply 1 application topically daily as needed (muscle pain).   . metoprolol succinate (TOPROL XL) 25 MG 24 hr tablet Take 1 tablet (25 mg total) by mouth daily.   . Multiple Vitamins-Minerals (MULTIVITAMIN ADULTS 50+ PO) Take 1 tablet by mouth every morning.   . nitroGLYCERIN (NITROSTAT) 0.4 MG SL tablet Place 1 tablet (0.4 mg total) under the tongue every 5 (five) minutes x 3 doses as needed for chest pain.   .Marland Kitchenomeprazole (PRILOSEC) 40 MG capsule Take 1 capsule (40 mg total) by mouth 2 (two) times daily. (Patient not taking: Reported on 12/23/2018)   . Polyethyl Glycol-Propyl Glycol (SYSTANE OP) Place 1 drop into both eyes daily as needed (dry  eyes).   . polyethylene glycol (MIRALAX / GLYCOLAX) 17 g packet Take 17 g by mouth daily.   [name] PROVENTIL HFA 108 (90 Base) MCG/ACT inhaler Inhale 2 puffs into the lungs every 6 (six) hours as needed for wheezing or shortness of breath.   . ranolazine (RANEXA) 500 MG 12 hr tablet Take 1 tablet (500 mg total) by mouth 2 (two) times daily.   . rosuvastatin (CRESTOR) 5 MG tablet Take 1 tablet (5 mg total) by mouth daily. (Patient taking differently: Take 5 mg by mouth at bedtime. )   . senna (SENOKOT) 8.6 MG tablet Take 1 tablet by mouth daily as needed for constipation.   . Tiotropium  Bromide Monohydrate (SPIRIVA RESPIMAT) 1.25 MCG/ACT AERS Inhale 2 puffs into the lungs daily.   [name] torsemide (DEMADEX) 20 MG tablet Take 1 tablet (20 mg total) by mouth daily. Increase to 20 mg two times a day for swelling or weight gain.   [name] traMADol (ULTRAM) 50 MG tablet Take 1 tablet (50 mg total) by mouth every 8 (eight) hours as needed for moderate pain.   [name] warfarin (COUMADIN) 3 MG tablet Take 1 tablet (3 mg total) by mouth daily. Take as directed by the coumadin clinic.    No facility-administered encounter medications on file as of 12/29/2018.     Goals Addressed            This Visit's Progress   . PharmD - Medication Managment       Current Barriers:  . Financial - reports copayment for Spiriva Respimat inhaler through patient's Part D coverage is not affordable o Note patient has Limited Partial Extra Help Subsidy . Limited vision . Chronic Disease Management support and education needs related to multiple chronic disease processes  Pharmacist Clinical Goal(s):  [name] Over the next 30 days, patient will work with CM Pharmacist to address needs related to medication adherence and diabetes management  Interventions: . Continue to collaborate with [facility] CPhT to assist patient with applying for patient assistance for Spiriva Respimat through [place] faxed documents from Mrs. Fiddler, register receipt from [place] for last fill of Spiriva and LIS rider document from [place]. . Collaborate with [facility] -  request documentation of Spiriva copayment that includes both name of drug and patient's copayment to supplement register receipt provided. o Speak with Renee who states that she will fax it to number provided, [facility] CM  Patient Self Care Activities:  . Check blood sugar, blood pressure and daily weight as directed by providers  . Takes medications, with assistance of wife, as directed . Patient attends scheduled medical appointments o Next  Cardiologist appointment scheduled for 11/6  Please see past updates related to this goal by clicking on the "Past Updates" button in the selected goal         Plan  The care management team will reach out to the patient again over the next 30 days.   [name], PharmD, [place] [employer] [phone]

## 2018-12-31 ENCOUNTER — Other Ambulatory Visit: Payer: Self-pay | Admitting: Pharmacy Technician

## 2018-12-31 NOTE — Patient Outreach (Signed)
[place] [facility]) Care Management  12/31/2018  JESSELEE POTH [birth year] [id number]    Received patient's printout for the cost of the Spiriva as well as a letter from his insurance company acknowledging that patient has partial LIS Extra Help.  Submitted these documents to BI per their request in an attempt to get patient's enrollment extended with the PAP.  Will followup with BI in 3-5 business days to inquire on status of application.  Nashay Brickley P. Dahlila Pfahler, [place] Management [phone]

## 2019-01-07 ENCOUNTER — Other Ambulatory Visit: Payer: Self-pay | Admitting: Pharmacy Technician

## 2019-01-07 NOTE — Patient Outreach (Signed)
[facility] [place] [facility]) Care Management  01/07/2019  MAHARI STRAHM 1934/02/02 [id number]    ADDENDUM  Successful outreach call placed to patient in regards to [facility] application for Spiriva Respimat.  Spoke to [place], [place] identifiers verified.  Informed patient's wife [name] that patient has been approved to receive the Spiriva until 03/17/2020. Discussed refill procedure with patient's wife. [name] verbalized understanding. Confirmed patient had name and number.  Will route note to embedded [facility] [name] that patient assistance has been completed and will remove myself from care team.  Luiz Ochoa. Orvill Coulthard, [place] Management [phone]

## 2019-01-07 NOTE — Patient Outreach (Signed)
[place] [facility]) Care Management  01/07/2019  Lucas Caldwell [birth year] [id number]    Care coordination call placed to BI in regards to patient's application for Spiriva Respimat.  Spoke to [place] who informed patient's appeal had been APPROVED and that patient's approval dates are 01/07/2019-03/17/2020. [place] informed to have patient call in approximately 2 weeks before funning out of medicaiton to request a refill.  Will outreach patient with this information.  Corrinne Benegas P. Liandro Thelin, [place] Management [phone]

## 2019-01-13 ENCOUNTER — Ambulatory Visit: Payer: Medicare HMO | Admitting: Internal Medicine

## 2019-01-14 ENCOUNTER — Telehealth: Payer: Medicare HMO

## 2019-01-18 ENCOUNTER — Other Ambulatory Visit: Payer: Self-pay

## 2019-01-20 ENCOUNTER — Ambulatory Visit: Payer: Medicare HMO | Admitting: Pharmacist

## 2019-01-20 DIAGNOSIS — IMO0002 Reserved for concepts with insufficient information to code with codable children: Secondary | ICD-10-CM

## 2019-01-20 DIAGNOSIS — E1121 Type 2 diabetes mellitus with diabetic nephropathy: Secondary | ICD-10-CM

## 2019-01-20 DIAGNOSIS — I10 Essential (primary) hypertension: Secondary | ICD-10-CM

## 2019-01-20 DIAGNOSIS — J432 Centrilobular emphysema: Secondary | ICD-10-CM

## 2019-01-20 NOTE — Patient Instructions (Signed)
Thank you allowing the Chronic Care Management Team to be a part of your care! It was a pleasure speaking with you today!     CCM (Chronic Care Management) Team    Janci Minor RN, BSN Nurse Care Coordinator  [phone]   [name] PharmD  Clinical Pharmacist  [phone]   Eula Fried LCSW Clinical Social Worker [phone]  Visit Information  Goals Addressed            This Visit's Progress   . PharmD - Medication Managment       Current Barriers:  . Financial o Note patient has Limited Partial Extra Help Subsidy . Limited vision . Chronic Disease Management support and education needs related to multiple chronic disease processes  Pharmacist Clinical Goal(s):  [name] Over the next 30 days, patient will work with CM Pharmacist to address needs related to medication adherence and diabetes management  Interventions: . Follow up regarding patient's constipation o Reports Mr. Pasquarello' constipation is resolved o Reports using Miralax, adjusting as needed (currently using ~every couple of days) . Counsel on importance of blood pressure monitoring o Mrs. Nobile reports patient has not checked blood pressure in several days o Encourage checking blood pressure daily, keeping log and to bring log (as well as log of daily weights) with patient to medical appointments - Note next Cardiology appointment on 11/6 . Counsel on importance of continued blood sugar monitoring and control o Review recent CBG results (see below) o Reports patient currently taking: - Trulicity 3.64 mg weekly - [name] 50 units QAM  . Reports patient self-adjusted down [name] dose overtime from instruction from PCP o Denies any low blood sugars - Review with Mrs. Hammad signs of low blood sugar and how to treat lows . Counsel on importance of medication adherence for inhaler o Reports Mr. Morello continuing to use Spiriva inhaler daily as directed o Reports patient's breathing improved  since starting on Spiriva o Note per [facility] CPhT, patient has now been approved for Spiriva patient assistance prorgram through 03/17/2020  Patient Self Care Activities:  . Check blood sugar, blood pressure and daily weight as directed by providers  Date Fasting Blood Glucose  29 - October 127  30 - October 90  31 - October -  1 - November 106  2 - November 135  3 - November -  4 - November 140  Average 120   . Takes medications, with assistance of wife, as directed . Patient attends scheduled medical appointments o Next Cardiologist appointment scheduled for 11/6  Please see past updates related to this goal by clicking on the "Past Updates" button in the selected goal         The patient verbalized understanding of instructions provided today and declined a print copy of patient instruction materials.   Telephone follow up appointment with care management team member scheduled for: 11/18 at 1:30pm  [name], PharmD, [facility] [phone]

## 2019-01-20 NOTE — Chronic Care Management (AMB) (Signed)
Chronic Care Management   Follow Up Note   01/20/2019 Name: Lucas Caldwell MRN: [id number] DOB: 1933/05/27  Referred by: [name], DO Reason for referral : Chronic Care Management (Patient Phone Call)   Lucas Caldwell is a 85 y.o. year old male who is a primary care patient of [name], DO. The CCM team was consulted for assistance with chronic disease management and care coordination needs. Lucas Caldwell has a past medical history including but not limited to type 2 diabetes, CKD, hyperlipidemia, COPD, atrial fibrillation, hypertension, coronary artery disease, Chronic HFpEF, hypothyroidism, and CML  Outreach call today to Lucas Caldwell and Lucas Caldwell today to follow up regarding medication management and diabetes management.  Review of patient status, including review of consultants reports, relevant laboratory and other test results, and collaboration with appropriate care team members and the patient's provider was performed as part of comprehensive patient evaluation and provision of chronic care management services.     Outpatient Encounter Medications as of 01/20/2019  Medication Sig Note  . Dulaglutide (TRULICITY) 6.76 HM/0.9OB SOPN Inject 0.75 mg into the skin once a week. (Patient taking differently: Inject 0.75 mg into the skin every Saturday. )   . Insulin Glargine ([name] KWIKPEN) 100 UNIT/ML SOPN Inject 50 Units into the skin daily.    . polyethylene glycol (MIRALAX / GLYCOLAX) 17 g packet Take 17 g by mouth daily as needed.    [name] ACCU-CHEK AVIVA PLUS test strip Use to check blood sugar up to 2 times daily   . Accu-Chek Softclix Lancets lancets Use to check blood sugar up to 2 times daily.   [name] acetaminophen (TYLENOL) 500 MG tablet Take 1,000 mg by mouth every 6 (six) hours as needed for moderate pain or headache.    . Alcohol Swabs 70 % PADS Use when checking blood sugar up to 3x daily   . allopurinol (ZYLOPRIM) 100 MG tablet TAKE 1 TABLET  EVERY DAY   . Blood Glucose Monitoring Suppl (ACCU-CHEK AVIVA PLUS) w/Device KIT Use to check blood sugar up to 2 times daily   . bosutinib (BOSULIF) 100 MG tablet Take 100 mg by mouth at bedtime as needed (tired/not feeling well). Take with food.  08/31/2018: Pt's wife states he only takes this as needed when he's "not feeling well"  . enalapril (VASOTEC) 5 MG tablet TAKE ONE TABLET BY MOUTH EVERY DAY   . gabapentin (NEURONTIN) 300 MG capsule Take 2 capsules (600 mg total) by mouth 2 (two) times daily. Max dose = 1241m daily for kidney function   . Insulin Pen Needle 31G X 5 MM MISC 31 g by Does not apply route as directed.   . isosorbide mononitrate (IMDUR) 60 MG 24 hr tablet Take 1.5 tablets (90 mg total) by mouth daily.   .Marland Kitchenlevothyroxine (SYNTHROID) 25 MCG tablet TAKE 1 TABLET (25 MCG TOTAL) BY MOUTH DAILY BEFORE BREAKFAST.   .Marland Kitchenloperamide (IMODIUM A-D) 2 MG tablet Take 2 mg by mouth 3 (three) times daily as needed for diarrhea or loose stools.    . Menthol-Zinc Oxide (GOLD BOND EX) Apply 1 application topically daily as needed (muscle pain).   . metoprolol succinate (TOPROL XL) 25 MG 24 hr tablet Take 1 tablet (25 mg total) by mouth daily.   . Multiple Vitamins-Minerals (MULTIVITAMIN ADULTS 50+ PO) Take 1 tablet by mouth every morning.   . nitroGLYCERIN (NITROSTAT) 0.4 MG SL tablet Place 1 tablet (0.4 mg total) under the tongue every 5 (five) minutes  x 3 doses as needed for chest pain.   [name] omeprazole (PRILOSEC) 40 MG capsule Take 1 capsule (40 mg total) by mouth 2 (two) times daily. (Patient not taking: Reported on 12/23/2018)   . Polyethyl Glycol-Propyl Glycol (SYSTANE OP) Place 1 drop into both eyes daily as needed (dry eyes).   [name] PROVENTIL HFA 108 (90 Base) MCG/ACT inhaler Inhale 2 puffs into the lungs every 6 (six) hours as needed for wheezing or shortness of breath.   . ranolazine (RANEXA) 500 MG 12 hr tablet Take 1 tablet (500 mg total) by mouth 2 (two) times daily.   . rosuvastatin  (CRESTOR) 5 MG tablet Take 1 tablet (5 mg total) by mouth daily. (Patient taking differently: Take 5 mg by mouth at bedtime. )   . senna (SENOKOT) 8.6 MG tablet Take 1 tablet by mouth daily as needed for constipation.   . Tiotropium Bromide Monohydrate (SPIRIVA RESPIMAT) 1.25 MCG/ACT AERS Inhale 2 puffs into the lungs daily.   [name] torsemide (DEMADEX) 20 MG tablet Take 1 tablet (20 mg total) by mouth daily. Increase to 20 mg two times a day for swelling or weight gain.   [name] traMADol (ULTRAM) 50 MG tablet Take 1 tablet (50 mg total) by mouth every 8 (eight) hours as needed for moderate pain.   [name] warfarin (COUMADIN) 3 MG tablet Take 1 tablet (3 mg total) by mouth daily. Take as directed by the coumadin clinic.    No facility-administered encounter medications on file as of 01/20/2019.     Goals Addressed            This Visit's Progress   . PharmD - Medication Managment       Current Barriers:  . Financial o Note patient has Limited Partial Extra Help Subsidy . Limited vision . Chronic Disease Management support and education needs related to multiple chronic disease processes  Pharmacist Clinical Goal(s):  [name] Over the next 30 days, patient will work with CM Pharmacist to address needs related to medication adherence and diabetes management  Interventions: . Follow up regarding patient's constipation o Reports Lucas Caldwell' constipation is resolved o Reports using Miralax, adjusting as needed (currently using ~every couple of days) . Counsel on importance of blood pressure monitoring o Lucas Caldwell reports patient has not checked blood pressure in several days o Encourage checking blood pressure daily, keeping log and to bring log (as well as log of daily weights) with patient to medical appointments - Note next Cardiology appointment on 11/6 . Counsel on importance of continued blood sugar monitoring and control o Review recent CBG results (see below) o Reports patient currently taking:  - Trulicity 2.19 mg weekly - [name] 50 units QAM  . Reports patient self-adjusted down [name] dose overtime from instruction from PCP o Denies any low blood sugars - Review with Lucas Caldwell signs of low blood sugar and how to treat lows . Counsel on importance of medication adherence for inhaler o Reports Lucas Caldwell continuing to use Spiriva inhaler daily as directed o Reports patient's breathing improved since starting on Spiriva o Note per [facility] CPh[facility], patient has now been approved for Spiriva patient assistance prorgram through 03/17/2020  Patient Self Care Activities:  . Check blood sugar, blood pressure and daily weight as directed by providers  Date Fasting Blood Glucose  29 - October 127  30 - October 90  31 - October -  1 - November 106  2 - November 135  3 - November -  4 - November 140  Average 120   . Takes medications, with assistance of wife, as directed . Patient attends scheduled medical appointments o Next Cardiologist appointment scheduled for 11/6  Please see past updates related to this goal by clicking on the "Past Updates" button in the selected goal         Plan  Telephone follow up appointment with care management team member scheduled for: 11/18 at 1:30pm  [name], PharmD, [place] [phone]

## 2019-01-22 ENCOUNTER — Encounter: Payer: Self-pay | Admitting: Internal Medicine

## 2019-01-22 ENCOUNTER — Other Ambulatory Visit: Payer: Self-pay

## 2019-01-22 ENCOUNTER — Ambulatory Visit (INDEPENDENT_AMBULATORY_CARE_PROVIDER_SITE_OTHER): Payer: Medicare HMO

## 2019-01-22 ENCOUNTER — Ambulatory Visit (INDEPENDENT_AMBULATORY_CARE_PROVIDER_SITE_OTHER): Payer: Medicare HMO | Admitting: Internal Medicine

## 2019-01-22 VITALS — BP 130/72 | HR 88 | Ht 64.0 in | Wt 207.5 lb

## 2019-01-22 DIAGNOSIS — I480 Paroxysmal atrial fibrillation: Secondary | ICD-10-CM

## 2019-01-22 DIAGNOSIS — N1832 Chronic kidney disease, stage 3b: Secondary | ICD-10-CM

## 2019-01-22 DIAGNOSIS — I25118 Atherosclerotic heart disease of native coronary artery with other forms of angina pectoris: Secondary | ICD-10-CM

## 2019-01-22 DIAGNOSIS — R42 Dizziness and giddiness: Secondary | ICD-10-CM | POA: Diagnosis not present

## 2019-01-22 DIAGNOSIS — R002 Palpitations: Secondary | ICD-10-CM | POA: Diagnosis not present

## 2019-01-22 NOTE — Progress Notes (Signed)
Follow-up Outpatient Visit Date: 01/22/2019  Primary Care Provider: [name], DO [address] [address]  Chief Complaint: Lightheadedness and palpitations  HPI:  Lucas Caldwell is a 85 y.o. year-old male with history of coronary artery disease, mixed ischemic and nonischemic cardiomyopathy, hypertension, hyperlipidemia, type 2 diabetes mellitus, paroxysmal atrial fibrillation, chronic kidney disease stage III, CML, iron deficiency anemia,GI bleed due to duodenal ulcers,BPH, and hypothyroidism, who presents for follow-up of coronary artery disease.  I last saw him a month ago, which time Lucas Caldwell reported sporadic chest pain and shortness of breath concerning for anginal equivalent.  We discussed PCI to the most heavily diseased segment of the LAD but agreed to defer this in favor of trialing recently started Spiriva.  Today, Lucas Caldwell reports that his shortness of breath is slightly better.  He has not had any further chest pain.  However, he intermittently experiences a "vibrating" feeling in his chest that lasts a few seconds to a few minutes.  He also notes occasional orthostatic lightheadedness, though it is unclear if this is related to the vibrating sensation.  He lost his balance once a couple of weeks ago and fell.  He did not injure himself.  He denies orthopnea, PND, and edema.  He has not had any bleeding.  He remains compliant with his medications.  --------------------------------------------------------------------------------------------------  Past Medical History:  Diagnosis Date  . Arthritis    "probably in his legs" (08/15/2016)  . Blind right eye   . BPH (benign prostatic hyperplasia)   . Breast asymmetry    Left breast is larger, present for several years.  [name] CAD (coronary artery disease)    a. Cath in the late 90's - reportedly ok;  b. 2014 s/p stenting x 2 @ [facility]; c 08/19/16 Cath/PCI with DES -> RCA, plan to treat LM 70% medically. Seen by  surgery and felt to be too high risk for CABG; d. 08/2018 Cath: LM 70 (iFR 0.86), LAD 20ost, 40p, 50/8m D1 70, LCX patent stent, OM1 30, RCA patent stent, 43mSR, 40d, RPDA 30. PCWP 8. CO/CI 4.0/2.1.  . Marland Kitchenhronic combined systolic (congestive) and diastolic (congestive) heart failure ([facility]   a. Previously reduced EF-->50% by echo in 2012;  b. 06/2015 Echo: EF 50-55%  c. 07/2016 Echo: EF 45-50%; d. 12/2017 Echo: EF 55%, Gr1 DD, mild MR; e. 08/2018 Echo: EF 35-40%, mildly dil PA.  . Marland Kitchenhronic kidney disease (CKD), stage IV (severe) (H[facility]anta [place]  . CML (chronic myelocytic leukemia) ([facility]  . GERD (gastroesophageal reflux disease)   . GIB (gastrointestinal bleeding)    a. 12/2017 3 unit PRBC GIB in setting of coumadin-->Endo/colon multiple duodenal ulcers and a single bleeding ulcer in the proximal ascending colon status post hemostatic clipping x2.  . Gout   . Hyperlipemia   . Hypertension   . Hypothyroidism   . Iron deficiency anemia   . Ischemic cardiomyopathy    a. Previously reduced EF-->50% by echo in 2012;  b. 06/2015 Echo: EF 50-55%, Gr2 DD, mild MR, mildly dil LA, Ao sclerosis, mild TR;  c. 07/2016 Echo: EF 45-50%; d. 12/2017 Echo: EF 55%, Gr1 DD, mild MR; e. 08/2018 Echo: EF 35-40%.  . Migraine    "in the 1960s" (08/15/2016)  . PAF (paroxysmal atrial fibrillation) (HCC)    a. ? Dx 2014-->s/p DCCV;  b. CHA2DS2VASc = 6--> Coumadin.  . Prostate cancer ([facility]  . RBBB   . Type II diabetes mellitus ([facility]  Past Surgical History:  Procedure Laterality Date  . CATARACT EXTRACTION W/ INTRAOCULAR LENS  IMPLANT, BILATERAL Bilateral   . COLONOSCOPY N/A 01/13/2018   Procedure: COLONOSCOPY;  Surgeon: Lin Landsman, MD;  Location: [facility] ENDOSCOPY;  Service: Gastroenterology;  Laterality: N/A;  . COLONOSCOPY WITH PROPOFOL N/A 01/01/2018   Procedure: COLONOSCOPY WITH PROPOFOL;  Surgeon: Lin Landsman, MD;  Location: [facility] ENDOSCOPY;  Service: Gastroenterology;  Laterality: N/A;  . CORONARY  ANGIOPLASTY WITH STENT PLACEMENT  09/2012   2 stents  . CORONARY STENT INTERVENTION N/A 08/19/2016   Procedure: Coronary Stent Intervention;  Surgeon: [name], MD;  Location: [place] CV LAB;  Service: Cardiovascular;  Laterality: N/A;  . ESOPHAGEAL MANOMETRY N/A 12/16/2017   Procedure: ESOPHAGEAL MANOMETRY (EM);  Surgeon: Lin Landsman, MD;  Location: [facility] ENDOSCOPY;  Service: Gastroenterology;  Laterality: N/A;  . ESOPHAGOGASTRODUODENOSCOPY N/A 12/20/2016   Procedure: ESOPHAGOGASTRODUODENOSCOPY (EGD);  Surgeon: Lin Landsman, MD;  Location[facility] ENDOSCOPY;  Service: Gastroenterology;  Laterality: N/A;  . ESOPHAGOGASTRODUODENOSCOPY N/A 01/13/2018   Procedure: ESOPHAGOGASTRODUODENOSCOPY (EGD);  Surgeon: Lin Landsman, MD;  Location[facility] ENDOSCOPY;  Service: Gastroenterology;  Laterality: N/A;  . ESOPHAGOGASTRODUODENOSCOPY (EGD) WITH PROPOFOL N/A 11/03/2017   Procedure: ESOPHAGOGASTRODUODENOSCOPY (EGD) WITH PROPOFOL;  Surgeon: Lin Landsman, MD;  Location: [facility] ENDOSCOPY;  Service: Gastroenterology;  Laterality: N/A;  . EYE SURGERY    . HERNIA REPAIR     "navel"  . LEFT HEART CATH AND CORONARY ANGIOGRAPHY N/A 08/14/2016   Procedure: Left Heart Cath and Coronary Angiography;  Surgeon: [name], MD;  Location: [place] CV LAB;  Service: Cardiovascular;  Laterality: N/A;  . RIGHT/LEFT HEART CATH AND CORONARY ANGIOGRAPHY N/A 09/01/2018   Procedure: RIGHT/LEFT HEART CATH AND CORONARY ANGIOGRAPHY;  Surgeon: [name], MD;  Location: [place] CV LAB;  Service: Cardiovascular;  Laterality: N/A;  . TOE AMPUTATION Right    "big toe"    Current Meds  Medication Sig  . ACCU-CHEK AVIVA PLUS test strip Use to check blood sugar up to 2 times daily  . Accu-Chek Softclix Lancets lancets Use to check blood sugar up to 2 times daily.  [name] acetaminophen (TYLENOL) 500 MG tablet Take 1,000 mg by mouth every 6 (six) hours as needed for moderate pain or  headache.   . Alcohol Swabs 70 % PADS Use when checking blood sugar up to 3x daily  . allopurinol (ZYLOPRIM) 100 MG tablet TAKE 1 TABLET EVERY DAY  . Blood Glucose Monitoring Suppl (ACCU-CHEK AVIVA PLUS) w/Device KIT Use to check blood sugar up to 2 times daily  . bosutinib (BOSULIF) 100 MG tablet Take 100 mg by mouth at bedtime as needed (tired/not feeling well). Take with food.   . Dulaglutide (TRULICITY) 4.97 WY/6.3ZC SOPN Inject 0.75 mg into the skin once a week. (Patient taking differently: Inject 0.75 mg into the skin every Saturday. )  . enalapril (VASOTEC) 5 MG tablet TAKE ONE TABLET BY MOUTH EVERY DAY  . gabapentin (NEURONTIN) 300 MG capsule Take 2 capsules (600 mg total) by mouth 2 (two) times daily. Max dose = 1258m daily for kidney function  . Insulin Glargine ([name] KWIKPEN) 100 UNIT/ML SOPN Inject 50 Units into the skin daily.   . Insulin Pen Needle 31G X 5 MM MISC 31 g by Does not apply route as directed.  . isosorbide mononitrate (IMDUR) 60 MG 24 hr tablet Take 1.5 tablets (90 mg total) by mouth daily.  .Marland Kitchenlevothyroxine (SYNTHROID) 25 MCG tablet TAKE 1 TABLET (25 MCG TOTAL)  BY MOUTH DAILY BEFORE BREAKFAST.  [name] loperamide (IMODIUM A-D) 2 MG tablet Take 2 mg by mouth 3 (three) times daily as needed for diarrhea or loose stools.   . Menthol-Zinc Oxide (GOLD BOND EX) Apply 1 application topically daily as needed (muscle pain).  . metoprolol succinate (TOPROL XL) 25 MG 24 hr tablet Take 1 tablet (25 mg total) by mouth daily.  . Multiple Vitamins-Minerals (MULTIVITAMIN ADULTS 50+ PO) Take 1 tablet by mouth every morning.  . nitroGLYCERIN (NITROSTAT) 0.4 MG SL tablet Place 1 tablet (0.4 mg total) under the tongue every 5 (five) minutes x 3 doses as needed for chest pain.  [name] Place 1 drop into both eyes daily as needed (dry eyes).  . polyethylene glycol (MIRALAX / GLYCOLAX) 17 g packet Take 17 g by mouth daily as needed.   [name] PROVENTIL HFA 108  (90 Base) MCG/ACT inhaler Inhale 2 puffs into the lungs every 6 (six) hours as needed for wheezing or shortness of breath.  . ranolazine (RANEXA) 500 MG 12 hr tablet Take 1 tablet (500 mg total) by mouth 2 (two) times daily.  . rosuvastatin (CRESTOR) 5 MG tablet Take 1 tablet (5 mg total) by mouth daily. (Patient taking differently: Take 5 mg by mouth at bedtime. )  . senna (SENOKOT) 8.6 MG tablet Take 1 tablet by mouth daily as needed for constipation.  . Tiotropium Bromide Monohydrate (SPIRIVA RESPIMAT) 1.25 MCG/ACT AERS Inhale 2 puffs into the lungs daily.  [name] torsemide (DEMADEX) 20 MG tablet Take 1 tablet (20 mg total) by mouth daily. Increase to 20 mg two times a day for swelling or weight gain.  [name] traMADol (ULTRAM) 50 MG tablet Take 1 tablet (50 mg total) by mouth every 8 (eight) hours as needed for moderate pain.    Allergies: Clopidogrel, Ciprofloxacin, Mirtazapine, and Spironolactone  Social History   Tobacco Use  . Smoking status: Former Smoker    Packs/day: 2.00    Years: 15.00    Pack years: 30.00    Types: Cigarettes    Quit date: 1979    Years since quitting: 41.8  . Smokeless tobacco: Former Systems developer  . Tobacco comment: quit 1979  Substance Use Topics  . Alcohol use: No    Comment: previously drank heavily - quit 1979.  . Drug use: No    Family History  Problem Relation Age of Onset  . Brain cancer Father   . Diabetes Sister   . Heart attack Sister     Review of Systems: A 12-system review of systems was performed and was negative except as noted in the HPI.  --------------------------------------------------------------------------------------------------  Physical Exam: BP 130/72 (BP Location: Right Arm, Patient Position: Sitting, Cuff Size: Large)   Pulse 88   Ht _0  (1.626 m)   Wt 207 lb 8 oz (94.1 kg)   SpO2 98%   BMI 35.62 kg/m   General: NAD.  Accompanied by his wife. HEENT: No conjunctival pallor or scleral icterus.  Facemask in place. Neck:  Supple without lymphadenopathy, thyromegaly, JVD, or HJR. Lungs: Normal work of breathing. Clear to auscultation bilaterally without wheezes or crackles. Heart: Regular rate and rhythm without murmurs, rubs, or gallops. Non-displaced PMI. Abd: Bowel sounds present. Soft, NT/ND without hepatosplenomegaly Ext: Trace lower extremity edema bilaterally. Skin: Warm and dry without rash.  EKG: Normal sinus rhythm with left axis deviation and right bundle branch block.  No significant change since prior tracing on 12/23/2018.  Lab Results  Component  Value Date   WBC 10.2 12/15/2018   HGB 11.7 (L) 12/15/2018   HCT 36.4 (L) 12/15/2018   MCV 84.5 12/15/2018   PLT 172 12/15/2018    Lab Results  Component Value Date   NA 134 (L) 12/15/2018   K 3.7 12/15/2018   CL 100 12/15/2018   CO2 24 12/15/2018   BUN 24 (H) 12/15/2018   CREATININE 1.73 (H) 12/15/2018   GLUCOSE 235 (H) 12/15/2018   ALT 14 08/28/2018    Lab Results  Component Value Date   CHOL 110 08/28/2018   HDL 28 (L) 08/28/2018   LDLCALC 51 08/28/2018   TRIG 155 (H) 08/28/2018   CHOLHDL 3.9 08/28/2018    --------------------------------------------------------------------------------------------------  ASSESSMENT AND PLAN: Coronary artery disease with stable angina: Lucas Caldwell reports that his breathing has improved with continued regular use of Spiriva.  He has not had any further chest pain.  We will therefore continue his current antianginal regimen of isosorbide mononitrate 90 mg daily, metoprolol succinate 25 mg daily, and ranolazine 500 mg twice daily.  If breathing were to worsen again or chest pain recur, we would need to readdress PCI of the LAD.  Lightheadedness, palpitations, and paroxysmal atrial fibrillation: Lucas Caldwell reports transient episodes of a vibration in his chest.  He has also had some intermittent lightheadedness, though it is not clear that the vibration and lightheadedness are connected.  He has a  history of paroxysmal atrial fibrillation, which could be playing a role.  We will therefore perform a 14-day event monitor.  In an effort to minimize his orthostatic lightheadedness, we will discontinue enalapril.  Lucas Caldwell should continue metoprolol and warfarin.  Chronic kidney disease stage III: This has been stable.  No medication changes at this time.  Continue follow-up with nephrology.  Follow-up: Return to clinic in 2 months.  [name], MD 01/22/2019 4:05 PM

## 2019-01-22 NOTE — Patient Instructions (Signed)
Medication Instructions:  Your physician has recommended you make the following change in your medication:  1- STOP Enalapril.  *If you need a refill on your cardiac medications before your next appointment, please call your pharmacy*  Lab Work: none If you have labs (blood work) drawn today and your tests are completely normal, you will receive your results only by: [name] MyChart Message (if you have MyChart) OR . A paper copy in the mail If you have any lab test that is abnormal or we need to change your treatment, we will call you to review the results.  Testing/Procedures: Your physician has recommended that you wear an 14 day ZIO event monitor. Event monitors are medical devices that record the heart's electrical activity. Doctors most often [place] these monitors to diagnose arrhythmias. Arrhythmias are problems with the speed or rhythm of the heartbeat. The monitor is a small, portable device. You can wear one while you do your normal daily activities. This is usually used to diagnose what is causing palpitations/syncope (passing out). A Zio Patch Event Heart monitor will be applied to your chest today.  You will wear the patch for 14 days. After 24 hours, you may shower with the heart monitor on. If you feel any palpitations, you may press and release the button in the middle of the monitor.  Follow-Up: At [facility], you and your health needs are our priority.  As part of our continuing mission to provide you with exceptional heart care, we have created designated Provider Care Teams.  These Care Teams include your primary Cardiologist (physician) and Advanced Practice Providers (APPs -  Physician Assistants and Nurse Practitioners) who all work together to provide you with the care you need, when you need it.  Your next appointment:   2 months (sometime after Christmas or early January).  The format for your next appointment:   In Person  Provider:    You may see [name],  MD or one of the following Advanced Practice Providers on your designated Care Team:    [name], NP  [name], PA-C  [name], PA-C

## 2019-01-24 ENCOUNTER — Encounter: Payer: Self-pay | Admitting: Internal Medicine

## 2019-02-01 ENCOUNTER — Telehealth: Payer: Self-pay | Admitting: Internal Medicine

## 2019-02-01 NOTE — Telephone Encounter (Signed)
Spoke to both pt and pt's spouse, [place] (DPR) to relay date/time of covid test.  Pt would like to research PFT and will call back to let us know if he would like to keep appt or not.

## 2019-02-02 ENCOUNTER — Ambulatory Visit (INDEPENDENT_AMBULATORY_CARE_PROVIDER_SITE_OTHER): Payer: Medicare HMO | Admitting: Family Medicine

## 2019-02-02 ENCOUNTER — Other Ambulatory Visit: Payer: Self-pay

## 2019-02-02 ENCOUNTER — Encounter: Payer: Self-pay | Admitting: Family Medicine

## 2019-02-02 VITALS — BP 129/89 | HR 86 | Temp 98.4°F | Resp 20 | Ht 64.0 in | Wt 207.0 lb

## 2019-02-02 DIAGNOSIS — R002 Palpitations: Secondary | ICD-10-CM | POA: Diagnosis not present

## 2019-02-02 DIAGNOSIS — R0602 Shortness of breath: Secondary | ICD-10-CM

## 2019-02-02 DIAGNOSIS — R42 Dizziness and giddiness: Secondary | ICD-10-CM | POA: Diagnosis not present

## 2019-02-02 DIAGNOSIS — E1121 Type 2 diabetes mellitus with diabetic nephropathy: Secondary | ICD-10-CM | POA: Diagnosis not present

## 2019-02-02 DIAGNOSIS — Z794 Long term (current) use of insulin: Secondary | ICD-10-CM

## 2019-02-02 DIAGNOSIS — I208 Other forms of angina pectoris: Secondary | ICD-10-CM

## 2019-02-02 DIAGNOSIS — E1142 Type 2 diabetes mellitus with diabetic polyneuropathy: Secondary | ICD-10-CM | POA: Diagnosis not present

## 2019-02-02 DIAGNOSIS — N183 Chronic kidney disease, stage 3 unspecified: Secondary | ICD-10-CM | POA: Diagnosis not present

## 2019-02-02 DIAGNOSIS — E1122 Type 2 diabetes mellitus with diabetic chronic kidney disease: Secondary | ICD-10-CM | POA: Diagnosis not present

## 2019-02-02 LAB — POCT UA - MICROALBUMIN: Microalbumin Ur, POC: NEGATIVE mg/L

## 2019-02-02 LAB — POCT GLYCOSYLATED HEMOGLOBIN (HGB A1C): Hemoglobin A1C: 6.6 % — AB (ref 4.0–5.6)

## 2019-02-02 MED ORDER — EASY TOUCH 31 GAUGE X 5/16" NEEDLE (8 MM)
1 refills | 0 days | Status: CP
Start: 2019-02-02 — End: ?

## 2019-02-02 NOTE — Patient Instructions (Addendum)
Thank you for coming to the office today.  Recent Labs    05/06/18 1433 08/24/18 1514 02/02/19 1619  HGBA1C 8.7* 8.3* 6.6*   Great result.  Reduce [name] insulin daily down to 40 units instead of 50 - Every 1 week, can do down by another 2 units if fasting sugar on average is < 150 in the morning. - Goal to get down to 20-25 units    Please schedule a Follow-up Appointment to: Return in about 4 months (around 06/02/2019) for 4 month DM A1c.  If you have any other questions or concerns, please feel free to call the office or send a message through [place]. You may also schedule an earlier appointment if necessary.  Additionally, you may be receiving a survey about your experience at our office within a few days to 1 week by e-mail or mail. We value your feedback.  [name], DO [place]

## 2019-02-02 NOTE — Progress Notes (Signed)
Subjective:    Patient ID: Lucas Caldwell, male    DOB: Jun 08, 1933, 86 y.o.   MRN: [id number]  Lucas Caldwell is a 85 y.o. male presenting on 02/02/2019 for Diabetes   HPI   CHRONIC DM, Type 2/ CKD-III Nephropathyand Neuropathy / Partial Ray Amputation R Great Toe Due for A1c today, see result below Previous A1c 8.7 to 8.3 [name] doing well with sugar now much improved CBG readings avg [phone] Meds:- Trulicity 0.75mg  weekly inj Saturday, [name] 50u daily (med assist) Reports good compliance. Tolerating well w/o side-effects Currently OFF ACEi (Enalapril DC'd by Cardiology) Lifestyle: - Diet (improving DM diet, drinks some water) - Exercise (limited by dyspnea) - Admits chronicDM Neuropathywith pain in feet bilateral,on Gabapentin with improvement, taking 300mg  x 2 pills twice a day - Followed by Podiatry, he is s/p R great toe amputation partial - [facility] Previously ordered diabetic shoes through [employer] he had some issues with them plans to talk to them His feet are now feeling better, without as much throbbing or pain - Denies hypoglycemia, nausea vomiting, fever chills, swelling, polyuria  Additional concerns Persistent dyspnea - working with Cardiology Dr End on antianginal regimen, isosorbide, ranolazine, metoprolol, his enalapril was DC'd due to low BP at times.  - Also on SPiriva improved, he his now awaiting PFTs and Pulm follow-up, PFT tomorrow  Depression screen [facility] 2/9 02/02/2019 11/24/2018 11/04/2018  Decreased Interest 0 0 0  Down, Depressed, Hopeless 0 0 0  PHQ - 2 Score 0 0 0  Altered sleeping 0 - -  Tired, decreased energy 0 - -  Change in appetite 0 - -  Feeling bad or failure about yourself  0 - -  Trouble concentrating 0 - -  Moving slowly or fidgety/restless 0 - -  Suicidal thoughts 0 - -  PHQ-9 Score 0 - -  Some recent data might be hidden    Social History   Tobacco Use  . Smoking status: Former Smoker    Packs/day: 2.00    Years:  15.00    Pack years: 30.00    Types: Cigarettes    Quit date: 1979    Years since quitting: 41.9  . Smokeless tobacco: Former Systems developer  . Tobacco comment: quit 1979  Substance Use Topics  . Alcohol use: No    Comment: previously drank heavily - quit 1979.  . Drug use: No    Review of Systems Per HPI unless specifically indicated above     Objective:    BP 129/89 (BP Location: Right Arm, Patient Position: Sitting, Cuff Size: Normal)   Pulse 86   Temp 98.4 F (36.9 C) (Oral)   Resp 20   Ht 5\' 4"  (1.626 m)   Wt 207 lb (93.9 kg)   SpO2 100%   BMI 35.53 kg/m   Wt Readings from Last 3 Encounters:  02/02/19 207 lb (93.9 kg)  01/22/19 207 lb 8 oz (94.1 kg)  12/23/18 206 lb (93.4 kg)    Physical Exam Vitals signs and nursing note reviewed.  Constitutional:      General: [name] not in acute distress.    Appearance: [name] well-developed. [name] not diaphoretic.     Comments: Chronically ill-appearing but currently well and comfortable, cooperative  HENT:     Head: Normocephalic and atraumatic.  Eyes:     General:        Right eye: No discharge.        Left eye: No discharge.  Conjunctiva/sclera: Conjunctivae normal.  Cardiovascular:     Rate and Rhythm: Normal rate.  Pulmonary:     Effort: Pulmonary effort is normal.  Musculoskeletal:     Comments: Uses cane for ambulation  Skin:    General: Skin is warm and dry.     Findings: No erythema or rash.  Neurological:     Mental Status: [name] alert and oriented to person, place, and time.  Psychiatric:        Behavior: Behavior normal.     Comments: Well groomed, good eye contact, normal speech and thoughts      Recent Labs    05/06/18 1433 08/24/18 1514 02/02/19 1619  HGBA1C 8.7* 8.3* 6.6*      Results for orders placed or performed in visit on 02/02/19  [facility] - Hemoglobin A1c POCT (Hb A1C)  Result Value Ref Range   Hemoglobin A1C 6.6 (A) 4.0 - 5.6 %  [facility] - POCT UA - Microalbumin  Result Value Ref Range    Microalbumin Ur, POC Negative mg/L   Creatinine, POC     Albumin/Creatinine Ratio, Urine, POC        Assessment & Plan:   Problem List Items Addressed This Visit    Stable angina (HCC)   SOB (shortness of breath)   Controlled type 2 diabetes mellitus with diabetic nephropathy ([place]) - Primary    Significantly improved A1c down to 6.6 on GLP1 and Insulin regimen. Improving adherence on medication now with financial assistance and lifestyle improving Complications - hypoglycemia (improved), CKD-III, peripheral neuropathy, cataracts, CAD  Plan:  1. Discussed DM management concern with CKD and risk of hypoglycemia - limited med options due to CKD - CONTINUE Trulicity 0.75mg  weekly - REDUCE [name] from 50u down to 40u daily now - then titrate down by 2 units every 1 week for fasting CBG avg < 150. Goal to get insulin basal dose down to 20-25 for now then future consider dose adjust Trulicity up to 1.5 if needed - Counseling on risk of hypoglycemia and symptoms 2. Encourage improved lifestyle - keep improving diet - limit carbs, limit sweet tea, inc water - he has poor health literacy on diabetes Follow-up 4 months for DM A1c, med adjust  Future reconsider dose adjust on GLP1 trulicity up to 1.5 dose if needed, caution with some side effect nausea now  Need for diabetic shoes, see A&P below      Relevant Orders   [facility] - Hemoglobin A1c POCT (Hb A1C) (Completed)   [facility] - POCT UA - Microalbumin (Completed)      #Stable angina / Dyspnea Followed by Cardiology Dr End on current med management anti anginal Followed by Rafael Bihari Dr Mortimer Fries for further management dyspnea, on spiriva has PFT upcoming  No orders of the defined types were placed in this encounter.     Follow up plan: Return in about 4 months (around 06/02/2019) for 4 month DM A1c.    [name], DO [facility] 02/02/2019, 4:11 PM

## 2019-02-02 NOTE — Assessment & Plan Note (Addendum)
Significantly improved A1c down to 6.6 on GLP1 and Insulin regimen. Improving adherence on medication now with financial assistance and lifestyle improving Complications - hypoglycemia (improved), CKD-III, peripheral neuropathy, cataracts, CAD  Plan:  1. Discussed DM management concern with CKD and risk of hypoglycemia - limited med options due to CKD - CONTINUE Trulicity 0.75mg  weekly - REDUCE [name] from 50u down to 40u daily now - then titrate down by 2 units every 1 week for fasting CBG avg < 150. Goal to get insulin basal dose down to 20-25 for now then future consider dose adjust Trulicity up to 1.5 if needed - Counseling on risk of hypoglycemia and symptoms 2. Encourage improved lifestyle - keep improving diet - limit carbs, limit sweet tea, inc water - he has poor health literacy on diabetes Follow-up 4 months for DM A1c, med adjust  Check urine microalbumin as well today - since off ACEi now

## 2019-02-03 ENCOUNTER — Other Ambulatory Visit: Payer: Self-pay

## 2019-02-03 ENCOUNTER — Other Ambulatory Visit
Admission: RE | Admit: 2019-02-03 | Discharge: 2019-02-03 | Disposition: A | Discharge status: Home / Self Care | Payer: Medicare HMO | Source: Ambulatory Visit | Attending: Internal Medicine | Admitting: Internal Medicine

## 2019-02-03 ENCOUNTER — Ambulatory Visit: Payer: Medicare HMO | Admitting: Pharmacist

## 2019-02-03 DIAGNOSIS — Z01812 Encounter for preprocedural laboratory examination: Secondary | ICD-10-CM | POA: Insufficient documentation

## 2019-02-03 DIAGNOSIS — Z20828 Contact with and (suspected) exposure to other viral communicable diseases: Secondary | ICD-10-CM | POA: Insufficient documentation

## 2019-02-03 LAB — SARS CORONAVIRUS 2 (TAT 6-24 HRS): SARS Coronavirus 2: NEGATIVE

## 2019-02-03 NOTE — Chronic Care Management (AMB) (Signed)
Chronic Care Management   Follow Up Note   02/03/2019 Name: Lucas Caldwell MRN: [id number] DOB: 12/12/1933  Referred by: [name], DO Reason for referral : Chronic Care Management (Patient Phone Call)   Lucas Caldwell is a 85 y.o. year old male who is a primary care patient of [name], DO. The CCM team was consulted for assistance with chronic disease management and care coordination needs. [name] has a past medical history including but not limited to type 2 diabetes, CKD, hyperlipidemia, COPD, atrial fibrillation, hypertension, coronary artery disease, Chronic HFpEF, hypothyroidism, and CML  I reached out to [name] by phone today.   Review of patient status, including review of consultants reports, relevant laboratory and other test results, and collaboration with appropriate care team members and the patient's provider was performed as part of comprehensive patient evaluation and provision of chronic care management services.    Objective  Lab Results  Component Value Date   HGBA1C 6.6 (A) 02/02/2019    Outpatient Encounter Medications as of 02/03/2019  Medication Sig Note  . Insulin Glargine ([name] KWIKPEN) 100 UNIT/ML SOPN Inject 40 Units into the skin daily.   [name] ACCU-CHEK AVIVA PLUS test strip Use to check blood sugar up to 2 times daily   . Accu-Chek Softclix Lancets lancets Use to check blood sugar up to 2 times daily.   [name] acetaminophen (TYLENOL) 500 MG tablet Take 1,000 mg by mouth every 6 (six) hours as needed for moderate pain or headache.    . Alcohol Swabs 70 % PADS Use when checking blood sugar up to 3x daily   . allopurinol (ZYLOPRIM) 100 MG tablet TAKE 1 TABLET EVERY DAY   . Blood Glucose Monitoring Suppl (ACCU-CHEK AVIVA PLUS) w/Device KIT Use to check blood sugar up to 2 times daily   . bosutinib (BOSULIF) 100 MG tablet Take 100 mg by mouth at bedtime as needed (tired/not feeling well). Take  with food.  08/31/2018: Pt's wife states he only takes this as needed when he's "not feeling well"  . Dulaglutide (TRULICITY) 9.17 HX/5.0VW SOPN Inject 0.75 mg into the skin once a week. (Patient taking differently: Inject 0.75 mg into the skin every Saturday. )   . gabapentin (NEURONTIN) 300 MG capsule Take 2 capsules (600 mg total) by mouth 2 (two) times daily. Max dose = 1253m daily for kidney function   . Insulin Pen Needle 31G X 5 MM MISC 31 g by Does not apply route as directed.   . isosorbide mononitrate (IMDUR) 60 MG 24 hr tablet Take 1.5 tablets (90 mg total) by mouth daily.   .Marland Kitchenlevothyroxine (SYNTHROID) 25 MCG tablet TAKE 1 TABLET (25 MCG TOTAL) BY MOUTH DAILY BEFORE BREAKFAST.   .Marland Kitchenloperamide (IMODIUM A-D) 2 MG tablet Take 2 mg by mouth 3 (three) times daily as needed for diarrhea or loose stools.    . Menthol-Zinc Oxide (GOLD BOND EX) Apply 1 application topically daily as needed (muscle pain).   . metoprolol succinate (TOPROL XL) 25 MG 24 hr tablet Take 1 tablet (25 mg total) by mouth daily.   . Multiple Vitamins-Minerals (MULTIVITAMIN ADULTS 50+ PO) Take 1 tablet by mouth every morning.   . nitroGLYCERIN (NITROSTAT) 0.4 MG SL tablet Place 1 tablet (0.4 mg total) under the tongue every 5 (five) minutes x 3 doses as needed for chest pain.   .[name] (SYSTANE OP) Place 1 drop into both eyes daily as needed (  dry eyes).   . polyethylene glycol (MIRALAX / GLYCOLAX) 17 g packet Take 17 g by mouth daily as needed.    [name] PROVENTIL HFA 108 (90 Base) MCG/ACT inhaler Inhale 2 puffs into the lungs every 6 (six) hours as needed for wheezing or shortness of breath.   . ranolazine (RANEXA) 500 MG 12 hr tablet Take 1 tablet (500 mg total) by mouth 2 (two) times daily.   . rosuvastatin (CRESTOR) 5 MG tablet Take 1 tablet (5 mg total) by mouth daily. (Patient taking differently: Take 5 mg by mouth at bedtime. )   . senna (SENOKOT) 8.6 MG tablet Take 1 tablet by mouth daily as needed  for constipation.   . Tiotropium Bromide Monohydrate (SPIRIVA RESPIMAT) 1.25 MCG/ACT AERS Inhale 2 puffs into the lungs daily.   [name] torsemide (DEMADEX) 20 MG tablet Take 1 tablet (20 mg total) by mouth daily. Increase to 20 mg two times a day for swelling or weight gain.   [name] traMADol (ULTRAM) 50 MG tablet Take 1 tablet (50 mg total) by mouth every 8 (eight) hours as needed for moderate pain.   [name] warfarin (COUMADIN) 3 MG tablet Take 1 tablet (3 mg total) by mouth daily. Take as directed by the coumadin clinic.    No facility-administered encounter medications on file as of 02/03/2019.     Goals Addressed            This Visit's Progress   . PharmD - Medication Managment       Current Barriers:  . Financial o Note patient has Limited Partial Extra Help Subsidy . Limited vision . Chronic Disease Management support and education needs related to multiple chronic disease processes  Pharmacist Clinical Goal(s):  [name] Over the next 30 days, patient will work with CM Pharmacist to address needs related to medication adherence and diabetes management  Interventions: . Perform chart review o Patient seen by PCP 11/17 - A1C 6.6% (decreased from 8.3% on 6/8) - Provider advise patient to: [name] CONTINUE Trulicity 5.85ID weekly . REDUCE [name] from 50u down to 40u daily now - then titrate down by 2 units every 1 week for fasting CBG avg < 150 . Follow up with patient's wife/caregiver. o Mrs. [name] confirms patient reduced [name] dose to 40 units daily and verbalizes understanding of direction for adjustment from PCP o Schedule appointment to follow up for CBG results and patient assistance applications for Trulicity and [name] for 2021 - Note patient has been approved for Spiriva patient assistance prorgram through 03/17/2020  Patient Self Care Activities:  . Check blood sugar, blood pressure and daily weight as directed by providers  . Takes medications, with assistance of wife, as directed  . Patient attends scheduled medical appointments  Please see past updates related to this goal by clicking on the "Past Updates" button in the selected goal         Plan  Telephone follow up appointment with care management team member scheduled for: 12/4 at 2:30 pm  [name], PharmD, [place] [phone]

## 2019-02-03 NOTE — Patient Instructions (Signed)
Thank you allowing the Chronic Care Management Team to be a part of your care! It was a pleasure speaking with you today!     CCM (Chronic Care Management) Team    Janci Minor RN, BSN Nurse Care Coordinator  [phone]   [name] PharmD  Clinical Pharmacist  [phone]   Eula Fried LCSW Clinical Social Worker [phone]  Visit Information  Goals Addressed            This Visit's Progress   . PharmD - Medication Managment       Current Barriers:  . Financial o Note patient has Limited Partial Extra Help Subsidy . Limited vision . Chronic Disease Management support and education needs related to multiple chronic disease processes  Pharmacist Clinical Goal(s):  [name] Over the next 30 days, patient will work with CM Pharmacist to address needs related to medication adherence and diabetes management  Interventions: . Perform chart review o Patient seen by PCP 11/17 - A1C 6.6% (decreased from 8.3% on 6/8) - Provider advise patient to: [name] CONTINUE Trulicity 0.75mg  weekly . REDUCE [name] from 50u down to 40u daily now - then titrate down by 2 units every 1 week for fasting CBG avg < 150 . Follow up with patient's wife/caregiver. o Mrs. Copen confirms patient reduced [name] dose to 40 units daily and verbalizes understanding of direction for adjustment from PCP o Schedule appointment to follow up for CBG results and patient assistance applications for Trulicity and [name] for 2021 - Note patient has been approved for Spiriva patient assistance prorgram through 03/17/2020  Patient Self Care Activities:  . Check blood sugar, blood pressure and daily weight as directed by providers  . Takes medications, with assistance of wife, as directed . Patient attends scheduled medical appointments  Please see past updates related to this goal by clicking on the "Past Updates" button in the selected goal         The patient verbalized understanding of  instructions provided today and declined a print copy of patient instruction materials.   Telephone follow up appointment with care management team member scheduled for: 12/4 at 2:30 pm  [name], PharmD, [place] [phone]

## 2019-02-03 NOTE — Telephone Encounter (Signed)
Spoke to pt, who stated that he would like to proceed with PFT. Pt is aware of date/time of covid test.  Nothing further is needed.

## 2019-02-04 ENCOUNTER — Ambulatory Visit: Payer: Medicare HMO | Attending: Internal Medicine

## 2019-02-04 ENCOUNTER — Other Ambulatory Visit: Payer: Self-pay

## 2019-02-04 DIAGNOSIS — C9210 Chronic myeloid leukemia, BCR/ABL-positive, not having achieved remission: Principal | ICD-10-CM

## 2019-02-04 DIAGNOSIS — R0602 Shortness of breath: Secondary | ICD-10-CM | POA: Diagnosis not present

## 2019-02-04 MED ORDER — ALBUTEROL SULFATE (2.5 MG/3ML) 0.083% IN NEBU
2.5000 mg | INHALATION_SOLUTION | Freq: Once | RESPIRATORY_TRACT | Status: AC
  Administered 2019-02-04: 2.5 mg via RESPIRATORY_TRACT
  Filled 2019-02-04: qty 3

## 2019-02-08 ENCOUNTER — Telehealth: Payer: Self-pay

## 2019-02-09 ENCOUNTER — Telehealth: Payer: Self-pay | Admitting: Internal Medicine

## 2019-02-09 NOTE — Telephone Encounter (Signed)
Pt wife returning call and would like a call back

## 2019-02-09 NOTE — Telephone Encounter (Signed)
Called and spoke to pt, who is requesting PFT results prior to 02/17/2019 OV.   DK please advise. Thanks

## 2019-02-09 NOTE — Telephone Encounter (Signed)
Severe restrictive lung disease with bronchospasms  This is official dx, will need OV for further discussion

## 2019-02-09 NOTE — Telephone Encounter (Signed)
Pt's spouse, [name] [facility]) is aware of results and voiced her understanding.  Nothing further is needed.

## 2019-02-10 DIAGNOSIS — R002 Palpitations: Secondary | ICD-10-CM | POA: Diagnosis not present

## 2019-02-15 ENCOUNTER — Telehealth: Payer: Self-pay | Admitting: Internal Medicine

## 2019-02-15 NOTE — Telephone Encounter (Signed)
Pt's spouse, [name](DPR) is calling requesting that she, her daughter and son accompanying pt during 02/17/2019 OV.  I have made [name] aware that pt can only have one visitor.  [name] voiced her understanding and had no further questions.  Nothing further is needed.

## 2019-02-17 ENCOUNTER — Other Ambulatory Visit: Payer: Self-pay

## 2019-02-17 ENCOUNTER — Telehealth: Payer: Self-pay | Admitting: Internal Medicine

## 2019-02-17 ENCOUNTER — Ambulatory Visit (INDEPENDENT_AMBULATORY_CARE_PROVIDER_SITE_OTHER): Payer: Medicare HMO | Admitting: Internal Medicine

## 2019-02-17 ENCOUNTER — Ambulatory Visit (INDEPENDENT_AMBULATORY_CARE_PROVIDER_SITE_OTHER): Payer: Medicare HMO

## 2019-02-17 ENCOUNTER — Encounter: Payer: Self-pay | Admitting: Internal Medicine

## 2019-02-17 VITALS — BP 124/88 | HR 89 | Temp 98.2°F | Ht 64.0 in | Wt 212.0 lb

## 2019-02-17 DIAGNOSIS — I480 Paroxysmal atrial fibrillation: Secondary | ICD-10-CM

## 2019-02-17 DIAGNOSIS — I5022 Chronic systolic (congestive) heart failure: Secondary | ICD-10-CM

## 2019-02-17 DIAGNOSIS — R0602 Shortness of breath: Secondary | ICD-10-CM | POA: Diagnosis not present

## 2019-02-17 DIAGNOSIS — J984 Other disorders of lung: Secondary | ICD-10-CM | POA: Diagnosis not present

## 2019-02-17 DIAGNOSIS — B351 Tinea unguium: Secondary | ICD-10-CM | POA: Diagnosis not present

## 2019-02-17 DIAGNOSIS — R0609 Other forms of dyspnea: Secondary | ICD-10-CM

## 2019-02-17 DIAGNOSIS — Z7901 Long term (current) use of anticoagulants: Secondary | ICD-10-CM | POA: Diagnosis not present

## 2019-02-17 DIAGNOSIS — R0600 Dyspnea, unspecified: Secondary | ICD-10-CM

## 2019-02-17 DIAGNOSIS — E1142 Type 2 diabetes mellitus with diabetic polyneuropathy: Secondary | ICD-10-CM | POA: Diagnosis not present

## 2019-02-17 LAB — POCT INR: INR: 2.3 (ref 2.0–3.0)

## 2019-02-17 MED ORDER — WARFARIN SODIUM 3 MG PO TABS: 3.0000 mg | ORAL_TABLET | ORAL | 1 refills | Status: DC

## 2019-02-17 NOTE — Patient Instructions (Signed)
Please continue dosage of 1 TABLET EVERY DAY EXCEPT 1.5 TABLETS ON MONDAYS & FRIDAYS. Recheck INR in 6 weeks.    

## 2019-02-17 NOTE — Patient Instructions (Signed)
Check ONO and 6MWT Stop Spiriva for now Recommend Cardio-Pulm Rehab referral

## 2019-02-17 NOTE — Progress Notes (Signed)
Name: Lucas Caldwell MRN: [id number] DOB: 1933-05-05     CONSULTATION DATE: 02/17/2019  REFERRING MD : END  CHIEF COMPLAINT: Follow-up shortness of breath  STUDIES:     CXR independently reviewed by Me today 12/2017 NL CXR No edema No effusions  **Pulmonary function tests 01/05/2019 Moderate restrictive lung disease no obstructive lung disease noted  **Chest x-ray 9/20 No acute or obvious signs of interstitial lung disease however there it may be left lower lobe atelectasis due to body habitus  Pulmonary function testing 6-minute walk test and Joselyn Arrow are pending  Studies have reviewed been reviewed with the patient  HISTORY OF PRESENT ILLNESS: Patient still remains short winded However he is not in any distress There is no overt signs of COPD exacerbation at this time Pulmonary function test do not show evidence of obstructive lung disease however it does show restrictive lung disease most likely related to his body habitus  Patient does have significant cardiomyopathy with EF of 30% with severe hypokinesis  I did explain to the patient that his progressive shortness of breath more likely is related to his body habitus and cardiomyopathy  Patient did not obtain sleep study as of yet   No evidence of severe heart failure at this time No evidence or signs of infection at this time No respiratory distress No fevers, chills, nausea, vomiting, diarrhea +extremity edema No evidence hemoptysis  Explained to patient that he will need sleep study as well as overnight pulse oximetry in 6-minute walk test and will need to have a discussion with the results if he needs oxygen   PAST MEDICAL HISTORY :   has a past medical history of Arthritis, Blind right eye, BPH (benign prostatic hyperplasia), Breast asymmetry, CAD (coronary artery disease), Chronic combined systolic (congestive) and diastolic (congestive) heart failure ([place]), Chronic kidney disease (CKD), stage IV (severe)  ([place]), CML (chronic myelocytic leukemia) ([place]), GERD (gastroesophageal reflux disease), GIB (gastrointestinal bleeding), Gout, Hyperlipemia, Hypertension, Hypothyroidism, Iron deficiency anemia, Ischemic cardiomyopathy, Migraine, PAF (paroxysmal atrial fibrillation) ([place]), Prostate cancer ([place]), RBBB, and Type II diabetes mellitus ([place]).  has a past surgical history that includes Toe amputation (Right); LEFT HEART CATH AND CORONARY ANGIOGRAPHY (N/A, 08/14/2016); Hernia repair; Cataract extraction w/ intraocular lens  implant, bilateral (Bilateral); Coronary angioplasty with stent (09/2012); CORONARY STENT INTERVENTION (N/A, 08/19/2016); Esophagogastroduodenoscopy (N/A, 12/20/2016); Esophagogastroduodenoscopy (egd) with propofol (N/A, 11/03/2017); Esophageal manometry (N/A, 12/16/2017); Eye surgery; Colonoscopy with propofol (N/A, 01/01/2018); Esophagogastroduodenoscopy (N/A, 01/13/2018); Colonoscopy (N/A, 01/13/2018); and RIGHT/LEFT HEART CATH AND CORONARY ANGIOGRAPHY (N/A, 09/01/2018). Prior to Admission medications   Medication Sig Start Date End Date Taking? Authorizing Provider  ACCU-CHEK AVIVA PLUS test strip Use to check blood sugar up to 2 times daily 11/25/18  Yes Karamalegos, [name], DO  Accu-Chek Softclix Lancets lancets Use to check blood sugar up to 2 times daily. 11/25/18  Yes Karamalegos, [name], DO  acetaminophen (TYLENOL) 500 MG tablet Take 1,000 mg by mouth every 6 (six) hours as needed for moderate pain or headache.    Yes [provider]  Alcohol Swabs 70 % PADS Use when checking blood sugar up to 3x daily 04/09/17  Yes Karamalegos, [name], DO  allopurinol (ZYLOPRIM) 100 MG tablet TAKE 1 TABLET EVERY DAY 09/28/18  Yes Karamalegos, [name], DO  Blood Glucose Monitoring Suppl (ACCU-CHEK AVIVA PLUS) w/Device KIT Use to check blood sugar up to 2 times daily 11/25/18  Yes Karamalegos, [name], DO  bosutinib (BOSULIF) 100 MG tablet Take 100 mg by mouth  at bedtime as needed  (tired/not feeling well). Take with food.    Yes [provider]  Dulaglutide (TRULICITY) 4.68 EH/2.1YY SOPN Inject 0.75 mg into the skin once a week. Patient taking differently: Inject 0.75 mg into the skin every Saturday.  10/31/17  Yes Karamalegos, Alexander J, DO  enalapril (VASOTEC) 5 MG tablet TAKE ONE TABLET BY MOUTH EVERY DAY 09/02/18  Yes Karamalegos, [name], DO  gabapentin (NEURONTIN) 300 MG capsule Take 2 capsules (600 mg total) by mouth 2 (two) times daily. Max dose = 1291m daily for kidney function 07/12/18  Yes Karamalegos, ADevonne Doughty DO  Insulin Glargine ([name] KWIKPEN) 100 UNIT/ML SOPN Inject 60 Units into the skin daily.   Yes [provider]  Insulin Pen Needle 31G X 5 MM MISC 31 g by Does not apply route as directed. 04/16/17  Yes KMikey [employer] NP  isosorbide mononitrate (IMDUR) 60 MG 24 hr tablet Take 1.5 tablets (90 mg total) by mouth daily. 11/25/18 02/23/19 Yes End, CHarrell Gave MD  levothyroxine (SYNTHROID) 25 MCG tablet TAKE 1 TABLET (25 MCG TOTAL) BY MOUTH DAILY BEFORE BREAKFAST. 09/28/18  Yes Karamalegos, ADevonne Doughty DO  loperamide (IMODIUM A-D) 2 MG tablet Take 2 mg by mouth 3 (three) times daily as needed for diarrhea or loose stools.    Yes [provider]  Menthol-Zinc Oxide (GOLD BOND EX) Apply 1 application topically daily as needed (muscle pain).   Yes [provider]  metoprolol succinate (TOPROL XL) 25 MG 24 hr tablet Take 1 tablet (25 mg total) by mouth daily. 03/06/18  Yes End, CHarrell Gave MD  Multiple Vitamins-Minerals (MULTIVITAMIN ADULTS 50+ PO) Take 1 tablet by mouth every morning.   Yes [provider]  nitroGLYCERIN (NITROSTAT) 0.4 MG SL tablet Place 1 tablet (0.4 mg total) under the tongue every 5 (five) minutes x 3 doses as needed for chest pain. 04/22/18  Yes BTheora Gianotti NP  omeprazole (PRILOSEC) 40 MG capsule Take 1 capsule (40 mg total) by mouth 2 (two) times daily. 11/03/18 12/03/18  Yes Vanga, RTally Due MD  Polyethyl Glycol-Propyl Glycol (SYSTANE OP) Place 1 drop into both eyes daily as needed (dry eyes).   Yes [provider]  PROVENTIL HFA 108 (90 Base) MCG/ACT inhaler Inhale 2 puffs into the lungs every 6 (six) hours as needed for wheezing or shortness of breath. 07/16/18  Yes Karamalegos, ADevonne Doughty DO  ranolazine (RANEXA) 500 MG 12 hr tablet Take 1 tablet (500 mg total) by mouth 2 (two) times daily. 11/11/18  Yes End, CHarrell Gave MD  torsemide (DEMADEX) 20 MG tablet Take 1 tablet (20 mg total) by mouth 2 (two) times daily. 10/16/18  Yes BTheora Gianotti NP  traMADol (ULTRAM) 50 MG tablet Take 1 tablet (50 mg total) by mouth every 8 (eight) hours as needed for moderate pain. 11/25/18  Yes Karamalegos, ADevonne Doughty DO  warfarin (COUMADIN) 3 MG tablet Take 1 tablet (3 mg total) by mouth daily. Take as directed by the coumadin clinic. Patient taking differently: Take 3-4.5 mg by mouth See admin instructions. Take 3 mg at night on Sun, Tue, Wed, Thur, and Sat.  Take 4.5 mg at night on Mon and Fri. 04/17/18  Yes End, CHarrell Gave MD  rosuvastatin (CRESTOR) 5 MG tablet Take 1 tablet (5 mg total) by mouth daily. Patient taking differently: Take 5 mg by mouth at bedtime.  07/14/18 11/24/18  End, CHarrell Gave MD   Allergies  Allergen Reactions   Clopidogrel Anaphylaxis    altered  mental status;  (electrolytes "out of wack" and confusion per family; THEY DO NOT REMEMBER ANY OTHER REACTION)- MSB 10/10/15   Ciprofloxacin Itching   Mirtazapine Diarrhea    Bad dreams    Spironolactone     gynecomastia    FAMILY HISTORY:  family history includes Brain cancer in his father; Diabetes in his sister; Heart attack in his sister. SOCIAL HISTORY:  reports that he quit smoking about 41 years ago. His smoking use included cigarettes. He has a 30.00 pack-year smoking history. He has quit using smokeless tobacco. He reports that he does not drink alcohol or use  drugs.     Review of Systems:  Gen:  Denies  fever, sweats, chills weight loss  HEENT: Denies blurred vision, double vision, ear pain, eye pain, hearing loss, nose bleeds, sore throat Cardiac:  No dizziness, chest pain or heaviness, chest tightness,edema, No JVD Resp:   No cough, -sputum production, +shortness of breath,-wheezing, -hemoptysis,  Gi: Denies swallowing difficulty, stomach pain, nausea or vomiting, diarrhea, constipation, bowel incontinence Gu:  Denies bladder incontinence, burning urine Ext:   Denies Joint pain, stiffness or swelling Skin: Denies  skin rash, easy bruising or bleeding or hives Endoc:  Denies polyuria, polydipsia , polyphagia or weight change Psych:   Denies depression, insomnia or hallucinations  Other:  All other systems negative    BP 124/88 (BP Location: Left Arm, Cuff Size: Normal)    Pulse 89    Temp 98.2 F (36.8 C) (Temporal)    Ht '5\' 4"'  (1.626 m)    Wt 212 lb (96.2 kg)    SpO2 100%    BMI 36.39 kg/m    SpO2: 100 % O2 Device: None (Room air)    Physical Examination:   GENERAL:NAD, no fevers, chills, no weakness no fatigue HEAD: Normocephalic, atraumatic.  EYES: PERLA, EOMI No scleral icterus.  NECK: Supple. No thyromegaly.  No JVD.  PULMONARY: CTA B/L no wheezing, rhonchi, crackles CARDIOVASCULAR: S1 and S2. Regular rate and rhythm. No murmurs GASTROINTESTINAL: Soft, nontender, nondistended. Positive bowel sounds.  MUSCULOSKELETAL: +edema.  NEUROLOGIC: No gross focal neurological deficits. 5/5 strength all extremities SKIN: No ulceration, lesions, rashes, or cyanosis.  PSYCHIATRIC: Insight, judgment intact. -depression -anxiety ALL OTHER ROS ARE NEGATIVE       MEDICATIONS: I have reviewed all medications and confirmed regimen as documented     ECHO 08/2018 1. Severe hypokinesis of the left ventricular, entire inferior wall.  2. The left ventricle has moderately reduced systolic function, with an ejection fraction of 35-40%.  The cavity size was normal. There is mildly increased left ventricular wall thickness. Left ventricular diastolic Doppler parameters are indeterminate.  Left ventricular diffuse hypokinesis.  3. The tricuspid valve is grossly normal.  4. The aortic valve is tricuspid.  5. Mildly dilated pulmonary artery.  6. The interatrial septum was not well visualized.     ASSESSMENT AND PLAN SYNOPSIS   85 year old pleasant African-American male assessment for his progressive shortness of breath and dyspnea exertion of the last several months At this time I do believe it is most likely related to his cardiomyopathy and less likely related to any type of obstructive lung disease.  His pulmonary function tests do show that he has some restrictive lung disease however is more likely related to his body habitus.   Patient does have a severe cardiomyopathy of EF 30% and sleep apnea does need to be assessed however he did not get a sleep study at this time   Patient  does have a significant former smoking history however COPD is not seen on pulmonary function testing Spiriva Respimat did not help his symptoms at this time    COVID-19 EDUCATION: The signs and symptoms of COVID-19 were discussed with the patient and how to seek care for testing.  The importance of social distancing was discussed today. Hand Washing Techniques and avoid touching face was advised.  MEDICATION ADJUSTMENTS/LABS AND TESTS ORDERED: Obtain sleep study Stop Spiriva Obtain 6-minute walk test Obtain overnight pulse oximetry  CURRENT MEDICATIONS REVIEWED AT LENGTH WITH PATIENT TODAY   Patient satisfied with Plan of action and management. All questions answered  Follow up in 6 months   Kanon Colunga Patricia Pesa, M.D.  [facility]  Medical Director [place] Director [facility] Cardio-Pulmonary Department

## 2019-02-17 NOTE — Telephone Encounter (Signed)
Spoke w/ pharmacy - they are attempting to refill pt's warfarin. Advised her that pt is overdue for INR check, that I will make him appt for today and send in new rx after he is seen. She is appreciative of the call.  Pt has appt w/ Dr. Mortimer Fries today @ 1:30, will add him on to my schedule to see him after that appt.  Spoke w/ pt and he is appreciative of the call.

## 2019-02-17 NOTE — Progress Notes (Signed)
SIX MIN WALK 02/17/2019  Medications Zyloprim 100mg , Gabapentin 300mg , insulin 100unit, Imdur 60mg , Synthroid 2mg , Toprol 25mg , multiple vitamins, Ranexa 500mg , Crestor 5mg  all taking at around 8:00a  Supplimental Oxygen during Test? (L/min) No  Laps 7  Partial Lap (in Meters) 0  Baseline BP (sitting) 128/78  Baseline Heartrate 85  Baseline Dyspnea (Borg Scale) 6  Baseline Fatigue (Borg Scale) 8  Baseline SPO2 100  BP (sitting) 150/88  Heartrate 101  Dyspnea (Borg Scale) 6  Fatigue (Borg Scale) 7  SPO2 99  BP (sitting) 148/82  Heartrate 86  SPO2 99  Stopped or Paused before Six Minutes Yes  Other Symptoms at end of Exercise test paused at 3:26 spo2 98% HR 98 and 2:07 99% HR 98 DUE TO SOB.  Distance Completed 238  Tech Comments: Pt completed test at slow pace with c/o sob. pt walked with a walking cane.

## 2019-02-17 NOTE — Telephone Encounter (Signed)
Please call pharmacy to discuss how patient is taking Warfarin.

## 2019-02-19 ENCOUNTER — Telehealth: Payer: Self-pay

## 2019-02-19 ENCOUNTER — Ambulatory Visit: Payer: Medicare HMO | Admitting: Pharmacist

## 2019-02-19 ENCOUNTER — Other Ambulatory Visit: Payer: Self-pay | Admitting: *Deleted

## 2019-02-19 DIAGNOSIS — Z794 Long term (current) use of insulin: Secondary | ICD-10-CM

## 2019-02-19 DIAGNOSIS — J432 Centrilobular emphysema: Secondary | ICD-10-CM

## 2019-02-19 DIAGNOSIS — E1121 Type 2 diabetes mellitus with diabetic nephropathy: Secondary | ICD-10-CM

## 2019-02-19 MED ORDER — TORSEMIDE 20 MG PO TABS: 20.0000 mg | ORAL_TABLET | Freq: Every day | ORAL | 3 refills | Status: DC

## 2019-02-19 NOTE — Chronic Care Management (AMB) (Signed)
Chronic Care Management   Follow Up Note   02/19/2019 Name: Lucas Caldwell MRN: [id number] DOB: Aug 16, 1933  Referred by: Lucas Hauser, DO Reason for referral : Chronic Care Management (Patient Phone Call) and Care Coordination   Lucas Caldwell is a 85 y.o. year old male who is a primary care patient of Lucas Hauser, DO. The CCM team was consulted for assistance with chronic disease management and care coordination needs.  Lucas Caldwell has a past medical history including but not limited to type 2 diabetes, CKD, hyperlipidemia, COPD, atrial fibrillation, hypertension, coronary artery disease, Chronic HFpEF, hypothyroidism, and CML  I reached out to Lucas Caldwell and his wife by phone today.   Review of patient status, including review of consultants reports, relevant laboratory and other test results, and collaboration with appropriate care team members and the patient's provider was performed as part of comprehensive patient evaluation and provision of chronic care management services.    Outpatient Encounter Medications as of 02/19/2019  Medication Sig Note  . Dulaglutide (TRULICITY) 6.75 QG/9.2EF SOPN Inject 0.75 mg into the skin once a week. (Patient taking differently: Inject 0.75 mg into the skin every Saturday. )   . Insulin Glargine ([name] KWIKPEN) 100 UNIT/ML SOPN Inject 40 Units into the skin daily.   [name] ACCU-CHEK AVIVA PLUS test strip Use to check blood sugar up to 2 times daily   . Accu-Chek Softclix Lancets lancets Use to check blood sugar up to 2 times daily.   [name] acetaminophen (TYLENOL) 500 MG tablet Take 1,000 mg by mouth every 6 (six) hours as needed for moderate pain or headache.    . Alcohol Swabs 70 % PADS Use when checking blood sugar up to 3x daily   . allopurinol (ZYLOPRIM) 100 MG tablet TAKE 1 TABLET EVERY DAY   . Blood Glucose Monitoring Suppl (ACCU-CHEK AVIVA PLUS) w/Device KIT Use to check blood sugar up to 2 times daily   .  bosutinib (BOSULIF) 100 MG tablet Take 100 mg by mouth at bedtime as needed (tired/not feeling well). Take with food.  08/31/2018: Pt's wife states he only takes this as needed when he's "not feeling well"  . gabapentin (NEURONTIN) 300 MG capsule Take 2 capsules (600 mg total) by mouth 2 (two) times daily. Max dose = 1275m daily for kidney function   . Insulin Pen Needle 31G X 5 MM MISC 31 g by Does not apply route as directed.   . isosorbide mononitrate (IMDUR) 60 MG 24 hr tablet Take 1.5 tablets (90 mg total) by mouth daily.   .Marland Kitchenlevothyroxine (SYNTHROID) 25 MCG tablet TAKE 1 TABLET (25 MCG TOTAL) BY MOUTH DAILY BEFORE BREAKFAST.   .Marland Kitchenloperamide (IMODIUM A-D) 2 MG tablet Take 2 mg by mouth 3 (three) times daily as needed for diarrhea or loose stools.    . Menthol-Zinc Oxide (GOLD BOND EX) Apply 1 application topically daily as needed (muscle pain).   . metoprolol succinate (TOPROL XL) 25 MG 24 hr tablet Take 1 tablet (25 mg total) by mouth daily.   . Multiple Vitamins-Minerals (MULTIVITAMIN ADULTS 50+ PO) Take 1 tablet by mouth every morning.   . nitroGLYCERIN (NITROSTAT) 0.4 MG SL tablet Place 1 tablet (0.4 mg total) under the tongue every 5 (five) minutes x 3 doses as needed for chest pain.   .[name] (SYSTANE OP) Place 1 drop into both eyes daily as needed (dry eyes).   . polyethylene glycol (MIRALAX / GLYCOLAX) 17 g packet  Take 17 g by mouth daily as needed.    [name] PROVENTIL HFA 108 (90 Base) MCG/ACT inhaler Inhale 2 puffs into the lungs every 6 (six) hours as needed for wheezing or shortness of breath.   . ranolazine (RANEXA) 500 MG 12 hr tablet Take 1 tablet (500 mg total) by mouth 2 (two) times daily.   . rosuvastatin (CRESTOR) 5 MG tablet Take 1 tablet (5 mg total) by mouth daily. (Patient taking differently: Take 5 mg by mouth at bedtime. )   . senna (SENOKOT) 8.6 MG tablet Take 1 tablet by mouth daily as needed for constipation.   . torsemide (DEMADEX) 20 MG tablet  Take 1 tablet (20 mg total) by mouth daily. Increase to 20 mg two times a day for swelling or weight gain.   [name] traMADol (ULTRAM) 50 MG tablet Take 1 tablet (50 mg total) by mouth every 8 (eight) hours as needed for moderate pain.   [name] warfarin (COUMADIN) 3 MG tablet Take 1 tablet (3 mg total) by mouth as directed. Take as directed by the coumadin clinic.   . [DISCONTINUED] Tiotropium Bromide Monohydrate (SPIRIVA RESPIMAT) 1.25 MCG/ACT AERS Inhale 2 puffs into the lungs daily. (Patient not taking: Reported on 02/19/2019)    No facility-administered encounter medications on file as of 02/19/2019.     Goals Addressed            This Visit's Progress   . PharmD - Medication Managment       Current Barriers:  . Financial o Note patient has Limited Partial Extra Help Subsidy . Limited vision . Chronic Disease Management support and education needs related to multiple chronic disease processes  Pharmacist Clinical Goal(s):  [name] Over the next 30 days, patient will work with CM Pharmacist to address needs related to medication adherence and diabetes management  Interventions: . Perform chart review o Patient 12/2 seen by Pulmonologist for follow-up for shortness of breath - Per note from Dr. Mortimer Fries, assessed "Spiriva Respimat did not help his symptoms at this time" and patient advised to Stop Spiriva - Patient advised to:  [name] Obtain 6-minute walk test (completed 12/2) . Obtain sleep study  . Obtain overnight pulse oximetry . Follow up with wife/patient regarding blood sugar monitoring and control o Reports patient currently taking: - [name] 40 untis daily - Trulicity 8.52 mg once weekly o Review recent CBG results (see below) o Denies any recent low blood sugar results of symptoms o Review again direction from PCP for [name] for patient to "titrate down by 2 units every 1 week for fasting CBG avg < 150" (Goal to get insulin basal dose down to 20-25 for now then future consider dose adjust  Trulicity up to 1.5 if needed) - [name] verbalizes understanding and states that she will decrease patient's [name] dose to 38 units daily . Follow up regarding instructions from 12/2 Pulmonology visit o Mrs. Papillion confirms patient has stopped using Spiriva inhaler as directed by Pulmonlogist o Reports contacted by [employer] [phone]) for an overnight study, not sure if this is the pulse oximetry or sleep study. Told that she would receive a call back next week about equipment to be sent to home.  . Will collaborate with [facility] Susy Frizzle for assistance with aiding patient with reapplying for patient assistance from Trulicity and Engineer, agricultural through [place] for 2021 calenadar year . Coordination of care call to [employer] [phone]). Representative states that this referral was for overnight pulse oximetry and will follow  up with patient . Coordination of care call to [facility] - speak with [facility], who states that patient was ordered to have a home sleep study, which would be completed through the [facility] office of [facility], but that the tests are currently on hold due to COVID-19 pandemic. Patient to be contacted when available again.  Patient Self Care Activities:  . Check blood sugar, blood pressure and daily weight as directed by providers  Date Fasting Blood Glucose  28 - November 138  29 - November 115  30 - November 115  1 - December 145  2 - December 172  3 - December 136  4 - December 142  Average 138   . Takes medications, with assistance of wife, as directed . Patient attends scheduled medical appointments  Please see past updates related to this goal by clicking on the "Past Updates" button in the selected goal         Plan  Telephone follow up appointment with care management team member scheduled for: 1/4 at 2:30pm  [name], PharmD, [place] [phone]

## 2019-02-19 NOTE — Telephone Encounter (Signed)
Spoke to [place] with [facility] center. Benjamine Mola had additional questions regarding ONO and sleep study that was ordered during 02/17/2019 OV. Pt is setup with [facility] for ONO. I have made Benjamine Mola aware that at this time sleep studies are on hold due to covid, and once they are back up and running our [place] office will contact pt.

## 2019-02-19 NOTE — Patient Instructions (Signed)
Thank you allowing the Chronic Care Management Team to be a part of your care! It was a pleasure speaking with you today!     CCM (Chronic Care Management) Team    Janci Minor RN, BSN Nurse Care Coordinator  [phone]   [name] PharmD  Clinical Pharmacist  [phone]   Eula Fried LCSW Clinical Social Worker [phone]  Visit Information  Goals Addressed            This Visit's Progress   . PharmD - Medication Managment       Current Barriers:  . Financial o Note patient has Limited Partial Extra Help Subsidy . Limited vision . Chronic Disease Management support and education needs related to multiple chronic disease processes  Pharmacist Clinical Goal(s):  [name] Over the next 30 days, patient will work with CM Pharmacist to address needs related to medication adherence and diabetes management  Interventions: . Perform chart review o Patient 12/2 seen by Pulmonologist for follow-up for shortness of breath . Follow up with wife/patient regarding blood sugar monitoring and control o Reports patient currently taking: - [name] 40 untis daily - Trulicity 7.09 mg once weekly o Review recent CBG results (see below) o Denies any recent low blood sugar results of symptoms - Review again direction from PCP for [place] for patient to "titrate down by 2 units every 1 week for fasting CBG avg < 150" - Mrs. Heslin verbalizes understanding and states that she will decrease patient's [name] dose to 38 units daily . Follow up regarding instructions from 12/2 Pulmonology visit o Mrs. Dougal confirms patient has stopped using Spiriva inhaler as directed by Pulmonlogist o Reports contacted by [employer] [phone] for an overnight study, not sure if this is the pulse oximetry or sleep study. Told that she would receive a call back next week about equipment to be sent to home.  . Will collaborate with [facility] CPhT Susy Frizzle for assistance with aiding  patient with reapplying for patient assistance from [employer] and Engineer, agricultural through [place] for 2021 calenadar year . Coordination of care call to [employer] [phone]). Representative states that this referral was for overnight pulse oximetry and will follow up with patient . Coordination of care call to [facility] Pulmonology - speak with [facility], who states that patient was ordered to have a home sleep study, which would be completed through the [facility] office of [facility], but that the tests are currently on hold due to COVID-19 pandemic. Patient to be contacted when available again.  Patient Self Care Activities:  . Check blood sugar, blood pressure and daily weight as directed by providers  Date Fasting Blood Glucose  28 - November 138  29 - November 115  30 - November 115  1 - December 145  2 - December 172  3 - December 136  4 - December 142  Average 138   . Takes medications, with assistance of wife, as directed . Patient attends scheduled medical appointments  Please see past updates related to this goal by clicking on the "Past Updates" button in the selected goal         The patient verbalized understanding of instructions provided today and declined a print copy of patient instruction materials.   Telephone follow up appointment with care management team member scheduled for: 1/4 at 2:30pm  [name], PharmD, [place] [phone]

## 2019-02-22 ENCOUNTER — Other Ambulatory Visit: Payer: Self-pay | Admitting: Family Medicine

## 2019-02-22 ENCOUNTER — Telehealth: Payer: Self-pay | Admitting: Internal Medicine

## 2019-02-22 DIAGNOSIS — M1A39X0 Chronic gout due to renal impairment, multiple sites, without tophus (tophi): Secondary | ICD-10-CM

## 2019-02-22 NOTE — Telephone Encounter (Signed)
Called patient and wife back. Patient wanted to hear the results of his monitor as well. He verbalized understanding.

## 2019-02-22 NOTE — Telephone Encounter (Signed)
Pt c/o medication issue:  1. Name of Medication: unclear  2. How are you currently taking this medication (dosage and times per day)?   3. Are you having a reaction (difficulty breathing--STAT)?   4. What is your medication issue? Patient says she is returning call to [facility] re previous med question

## 2019-02-23 ENCOUNTER — Telehealth: Payer: Self-pay | Admitting: Family Medicine

## 2019-02-23 ENCOUNTER — Other Ambulatory Visit: Payer: Self-pay | Admitting: Pharmacy Technician

## 2019-02-23 DIAGNOSIS — R0602 Shortness of breath: Secondary | ICD-10-CM | POA: Diagnosis not present

## 2019-02-23 NOTE — Patient Outreach (Signed)
[facility]) Care Management  02/23/2019  LAWRNCE REYEZ 03-Jan-1934 [id number]                                       Medication Assistance Referral  Referral From: [facility] Embedded RPh Dorthula Perfect   Medication/Company: Nancee Liter and [employer] / Ralph Leyden Patient application portion:  Mailed Provider application portion: Faxed  to Dr. [name] Provider address/fax verified via: Office website  This is for 2021 patient assistance  Follow up:  Will follow up with patient in 20-30 business days to confirm application(s) have been received.  Alvina Strother P. Norman Bier, [place] Management [phone]

## 2019-02-23 NOTE — Telephone Encounter (Signed)
° ° °  Called pt regarding Community Resource Referral for hearing assistance.   Spoke with wife and advised to call [facility] in [place] to discuss hearing test and free hearing aids from Department of social services, will need to call to make an appt. [facility] [address], [place], [place] [address] ([place]) [address] [place], [place], [place] [phone] [facility] [place] (24-hour availability) [phone] or [phone] or 711 (Voice) [email or web address]  Gave information to wife along with my number should she need further assistance.  [place] Management ??[email or web address]   ??[phone]

## 2019-02-24 ENCOUNTER — Telehealth: Payer: Self-pay | Admitting: Internal Medicine

## 2019-02-24 DIAGNOSIS — I5022 Chronic systolic (congestive) heart failure: Secondary | ICD-10-CM

## 2019-02-24 DIAGNOSIS — J984 Other disorders of lung: Secondary | ICD-10-CM

## 2019-02-24 NOTE — Telephone Encounter (Signed)
Spoke to pt's spouse, Rose(DPR).  Rose is requesting ONO results.  ONO has been placed in DK's folder for review.  Dr. Mortimer Fries please advise on results. Thanks

## 2019-02-26 ENCOUNTER — Other Ambulatory Visit: Payer: Self-pay

## 2019-02-26 ENCOUNTER — Encounter: Payer: Medicare HMO | Attending: Internal Medicine

## 2019-02-26 DIAGNOSIS — I5022 Chronic systolic (congestive) heart failure: Secondary | ICD-10-CM

## 2019-02-26 NOTE — Telephone Encounter (Signed)
ONO reviewed by Dr. Mortimer Fries- recommend 1L QHS Both pt and Pt's spouse, [name](DPR) are aware of results and voiced her understanding.  Order has been placed for oxygen  Nothing further is needed.

## 2019-02-26 NOTE — Progress Notes (Signed)
Virtual Orientation performed. Patient informed when to come in for RD and EP orientation. Diagnosis can be found in [facility] 10/09/2018. Informed patient to bring a medication list since he was not at home at the time of Phone call and his wife takes care of his medications.

## 2019-03-01 ENCOUNTER — Telehealth: Payer: Self-pay | Admitting: Internal Medicine

## 2019-03-01 ENCOUNTER — Telehealth: Payer: Self-pay

## 2019-03-01 NOTE — Telephone Encounter (Signed)
Called and spoke with pt's wife who stated that [employer] just called her and that [employer] will deliver 02 tomorrow due to having to obtain approval from insurance company before 02 is delivered.  Pt voiced understanding and nothing else is needed at this time. Rhonda J Cobb

## 2019-03-01 NOTE — Telephone Encounter (Signed)
Called and spoke to pt's spouse, [name](DPR). [name] is calling for update on oxygen. Pt is wanting to know when oxygen will be delivered.  [name] would also like contact info for DME company. I do not see that order has been sent to DME as of yet.  Order was placed on 02/26/2019 for night time oxygen.  Suanne Marker, can you help with this.

## 2019-03-01 NOTE — Telephone Encounter (Signed)
Called and spoke with pt's wife.  Advised that 02 order was placed on Friday 02/26/2019, however, DK was not in office to sign order.  Order was signed today and was sent High Priority to [employer] to contact patient and deliver 02 today.  Pt's wife is aware that [employer] will contact her to make arrangements to deliver 02 today.  Nothing else needed at this time. Rhonda J Cobb

## 2019-03-02 ENCOUNTER — Telehealth: Payer: Self-pay | Admitting: Internal Medicine

## 2019-03-02 ENCOUNTER — Ambulatory Visit: Payer: Medicare HMO

## 2019-03-02 DIAGNOSIS — I5022 Chronic systolic (congestive) heart failure: Secondary | ICD-10-CM | POA: Diagnosis not present

## 2019-03-02 NOTE — Telephone Encounter (Signed)
Called and spoke to pt's spouse, [name](DPR).  [name] would like to reschedule pt's pulmonary rehab appt.  I have provided [name] with pulmonary rehab's contact number.  Nothing further is needed.

## 2019-03-03 ENCOUNTER — Other Ambulatory Visit: Payer: Self-pay | Admitting: Pharmacy Technician

## 2019-03-03 NOTE — Patient Outreach (Signed)
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  Spoke to [place], patient's wife, HIPAA identifiers verified.  [name] informed she has received the applications in the mail. She wanted to discuss the application. We went over the application page by page and patient's wife verbalized understanding. [name] informed she would place in the mail for pick up tomorrow.  Will route note to [place] in 15 business days if application has not been received.  Yahaira Bruski P. Aleatha Taite, [place] Management [phone]

## 2019-03-04 ENCOUNTER — Ambulatory Visit: Payer: Medicare HMO

## 2019-03-15 ENCOUNTER — Ambulatory Visit: Payer: Medicare HMO

## 2019-03-15 ENCOUNTER — Ambulatory Visit (INDEPENDENT_AMBULATORY_CARE_PROVIDER_SITE_OTHER): Payer: Medicare HMO | Admitting: *Deleted

## 2019-03-15 DIAGNOSIS — Z794 Long term (current) use of insulin: Secondary | ICD-10-CM | POA: Diagnosis not present

## 2019-03-15 DIAGNOSIS — E1121 Type 2 diabetes mellitus with diabetic nephropathy: Secondary | ICD-10-CM

## 2019-03-15 DIAGNOSIS — I5032 Chronic diastolic (congestive) heart failure: Secondary | ICD-10-CM

## 2019-03-15 DIAGNOSIS — K5900 Constipation, unspecified: Secondary | ICD-10-CM

## 2019-03-15 DIAGNOSIS — R0602 Shortness of breath: Secondary | ICD-10-CM

## 2019-03-15 DIAGNOSIS — I10 Essential (primary) hypertension: Secondary | ICD-10-CM

## 2019-03-15 NOTE — Chronic Care Management (AMB) (Signed)
Chronic Care Management   Follow Up Note   03/15/2019 Name: Lucas Caldwell MRN: [id number] DOB: 09/20/1933  Referred by: Karamalegos, Alexander J, DO Reason for referral : No chief complaint on file.   Luisfernando T Delaluz is a 85 y.o. year old male who is a primary care patient of Karamalegos, Alexander J, DO. The CCM team was consulted for assistance with chronic disease management and care coordination needs.    Spoke with patient and spouse today via telephone outreach.   Review of patient status, including review of consultants reports, relevant laboratory and other test results, and collaboration with appropriate care team members and the patient's provider was performed as part of comprehensive patient evaluation and provision of chronic care management services.    SDOH (Social Determinants of Health) screening performed today: Physical Activity. See Care Plan for related entries.   Outpatient Encounter Medications as of 03/15/2019  Medication Sig Note  . ACCU-CHEK AVIVA PLUS test strip Use to check blood sugar up to 2 times daily   . Accu-Chek Softclix Lancets lancets Use to check blood sugar up to 2 times daily.   . acetaminophen (TYLENOL) 500 MG tablet Take 1,000 mg by mouth every 6 (six) hours as needed for moderate pain or headache.    . Alcohol Swabs 70 % PADS Use when checking blood sugar up to 3x daily   . allopurinol (ZYLOPRIM) 100 MG tablet TAKE ONE TABLET BY MOUTH ONCE DAILY   . Blood Glucose Monitoring Suppl (ACCU-CHEK AVIVA PLUS) w/Device KIT Use to check blood sugar up to 2 times daily   . bosutinib (BOSULIF) 100 MG tablet Take 100 mg by mouth at bedtime as needed (tired/not feeling well). Take with food.  08/31/2018: Pt's wife states he only takes this as needed when he's "not feeling well"  . Dulaglutide (TRULICITY) 0.75 MG/0.5ML SOPN Inject 0.75 mg into the skin once a week. (Patient taking differently: Inject 0.75 mg into the skin every Saturday. )   .  gabapentin (NEURONTIN) 300 MG capsule Take 2 capsules (600 mg total) by mouth 2 (two) times daily. Max dose = 1200mg daily for kidney function   . Insulin Glargine ([name] KWIKPEN) 100 UNIT/ML SOPN Inject 40 Units into the skin daily.   . Insulin Pen Needle 31G X 5 MM MISC 31 g by Does not apply route as directed.   . isosorbide mononitrate (IMDUR) 60 MG 24 hr tablet Take 1.5 tablets (90 mg total) by mouth daily.   . levothyroxine (SYNTHROID) 25 MCG tablet TAKE 1 TABLET (25 MCG TOTAL) BY MOUTH DAILY BEFORE BREAKFAST.   . loperamide (IMODIUM A-D) 2 MG tablet Take 2 mg by mouth 3 (three) times daily as needed for diarrhea or loose stools.    . Menthol-Zinc Oxide (GOLD BOND EX) Apply 1 application topically daily as needed (muscle pain).   . metoprolol succinate (TOPROL XL) 25 MG 24 hr tablet Take 1 tablet (25 mg total) by mouth daily.   . Multiple Vitamins-Minerals (MULTIVITAMIN ADULTS 50+ PO) Take 1 tablet by mouth every morning.   . nitroGLYCERIN (NITROSTAT) 0.4 MG SL tablet Place 1 tablet (0.4 mg total) under the tongue every 5 (five) minutes x 3 doses as needed for chest pain.   . Polyethyl Glycol-Propyl Glycol (SYSTANE OP) Place 1 drop into both eyes daily as needed (dry eyes).   . polyethylene glycol (MIRALAX / GLYCOLAX) 17 g packet Take 17 g by mouth daily as needed.    . PROVENTIL HFA 108 (90   Base) MCG/ACT inhaler Inhale 2 puffs into the lungs every 6 (six) hours as needed for wheezing or shortness of breath.   . ranolazine (RANEXA) 500 MG 12 hr tablet Take 1 tablet (500 mg total) by mouth 2 (two) times daily.   . rosuvastatin (CRESTOR) 5 MG tablet Take 1 tablet (5 mg total) by mouth daily. (Patient taking differently: Take 5 mg by mouth at bedtime. )   . senna (SENOKOT) 8.6 MG tablet Take 1 tablet by mouth daily as needed for constipation.   . torsemide (DEMADEX) 20 MG tablet Take 1 tablet (20 mg total) by mouth daily. Increase to 20 mg two times a day for swelling or weight gain.   [name]  traMADol (ULTRAM) 50 MG tablet Take 1 tablet (50 mg total) by mouth every 8 (eight) hours as needed for moderate pain.   [name] warfarin (COUMADIN) 3 MG tablet Take 1 tablet (3 mg total) by mouth as directed. Take as directed by the coumadin clinic.    No facility-administered encounter medications on file as of 03/15/2019.     Goals Addressed: Patient stated " I am feeling more short-winded"   Current Barriers:  [name] Knowledge deficit related to basic heart failure pathophysiology and self care management . Patient not weighing and recording daily  Case Manager Clinical Goal(s):  [name] Over the next 30 days, patient will weigh daily and record (notifying MD of 3 lb weight gain over night or 5 lb in a week)  Interventions:  . Basic overview and discussion of pathophysiology of Heart Failure reviewed  . Provided verbal education on low sodium diet . Assessed need for readable accurate scales in home . Provided education about placing scale on hard, flat surface . Advised patient to weigh each morning after emptying bladder . Discussed importance of daily weight and advised patient to weigh and record daily . Reviewed role of diuretics in prevention of fluid overload and management of heart failure . Discussed s/s of HF and increased fluid. Patient verbally reported: Edema to BLE, increased fatigue, decreased appetite and increased SOB with small tasks such as putting on his shoes. Denied using increased pillows. Unable to assess weight gain related to not weighing daily. Per recent office weights patient up approx 5 lbs.  . Reviewed torsemide, spouse states patient has been taking it two times per day related to increase swelling with no observed improvement of edema or SOB.  . Patient has been followed closely by cardiology and recently had studies by pulmonology to determine underlying cause of increased SOB.  . Patient reports using 02 at night and during daytime naps. Patient stated he has had an  extreme amount of abdominal gas since starting 02. Encouraged patient to speak with Cardiologist about this. Will ask Pharm D if Gas X would be appropriate.  . Reviewed upcoming cardiology appointment: Dr. Saunders Revel 12/31 @ 4pm. (Plan to send Cardiologist an inbasket with patient's symptoms.)  . Patient also stated he is to start Pulmonary Rehab soon.  . Patient reports blood sugar to be 140 today. Wife stated his blood pressure last week was "good"  Last reported VS as follows 02/17/2019 Pulmonology appt: BP 124/88 (BP Location: Left Arm, Cuff Size: Normal)   Pulse 89   Temp 98.2 F (36.8 C) (Temporal)   Resp 20   Ht 5' 4" (1.626 m)   Wt 212 lb (96.2 kg)   SpO2 100%   BMI 36.39 kg/m    Wt Readings from Last 3 Encounters:  02/17/19 212 lb (96.2 kg)  02/02/19 207 lb (93.9 kg)  01/22/19 207 lb 8 oz (94.1 kg)    Patient Self Care Activities:  . Takes Heart Failure Medications as prescribed  Initial goal documentation  The care management team will reach out to the patient again over the next 30  days.  The patient has been provided with contact information for the care management team and has been advised to call with any health related questions or concerns.   Merlene Morse Bodi Palmeri RN, BSN Nurse Case Pharmacist, [facility] Care Management  [phone]) Business Mobile

## 2019-03-15 NOTE — Patient Instructions (Signed)
Current Barriers:  [name] Knowledge deficit related to basic heart failure pathophysiology and self care management . Patient not weighing and recording daily  Case Manager Clinical Goal(s):  [name] Over the next 30 days, patient will weigh daily and record (notifying MD of 3 lb weight gain over night or 5 lb in a week)  Interventions:  . Basic overview and discussion of pathophysiology of Heart Failure reviewed  . Provided verbal education on low sodium diet . Assessed need for readable accurate scales in home . Provided education about placing scale on hard, flat surface . Advised patient to weigh each morning after emptying bladder . Discussed importance of daily weight and advised patient to weigh and record daily . Reviewed role of diuretics in prevention of fluid overload and management of heart failure . Discussed s/s of HF and increased fluid. Patient verbally reported: Edema to BLE, increased fatigue, decreased appetite and increased SOB with small tasks such as putting on his shoes. Denied using increased pillows. Unable to assess weight gain related to not weighing daily. Per recent office weights patient up approx 5 lbs.  . Reviewed torsemide, spouse states patient has been taking it two times per day related to increase swelling with no observed improvement of edema or SOB.  . Patient has been followed closely by cardiology and recently had studies by pulmonology to determine underlying cause of increased SOB.  . Patient reports using 02 at night and during daytime naps. Patient stated he has had an extreme amount of abdominal gas since starting 02. Encouraged patient to speak with Cardiologist about this. Will ask Pharm D if Gas X would be appropriate.  . Reviewed upcoming cardiology appointment: Dr. Saunders Revel 12/31 @ 4pm.  . Patient also stated he is to start Pulmonary Rehab soon.  . Patient reports blood sugar to be 140 today. Wife stated his blood pressure last week was "good"  Last reported VS  as follows 02/17/2019 Pulmonology appt: BP 124/88 (BP Location: Left Arm, Cuff Size: Normal)   Pulse 89   Temp 98.2 F (36.8 C) (Temporal)   Resp 20   Ht 5\' 4"  (1.626 m)   Wt 212 lb (96.2 kg)   SpO2 100%   BMI 36.39 kg/m    Wt Readings from Last 3 Encounters:  02/17/19 212 lb (96.2 kg)  02/02/19 207 lb (93.9 kg)  01/22/19 207 lb 8 oz (94.1 kg)    Patient Self Care Activities:  . Takes Heart Failure Medications as prescribed  Initial goal documentation

## 2019-03-16 ENCOUNTER — Other Ambulatory Visit: Payer: Self-pay | Admitting: Pharmacy Technician

## 2019-03-16 NOTE — Patient Outreach (Signed)
[facility]) Care Management  03/16/2019  Lucas Caldwell 01-13-1934 [id number]    Received both patient and provider portion(s) of patient assistance application(s) for [employer] and [employer]. Faxed completed application and required documents into [employer].  Will follow up with company(ies) in 7-10 business days to check status of application(s).  Jevon Littlepage P. Shemica Meath, [place] Management [phone]

## 2019-03-18 ENCOUNTER — Encounter: Payer: Self-pay | Admitting: Internal Medicine

## 2019-03-18 ENCOUNTER — Ambulatory Visit (INDEPENDENT_AMBULATORY_CARE_PROVIDER_SITE_OTHER): Payer: Medicare HMO | Admitting: Internal Medicine

## 2019-03-18 ENCOUNTER — Telehealth: Payer: Self-pay | Admitting: Internal Medicine

## 2019-03-18 ENCOUNTER — Other Ambulatory Visit
Admission: RE | Admit: 2019-03-18 | Discharge: 2019-03-18 | Disposition: A | Discharge status: Home / Self Care | Payer: Medicare HMO | Source: Ambulatory Visit | Attending: Internal Medicine | Admitting: Internal Medicine

## 2019-03-18 ENCOUNTER — Other Ambulatory Visit: Payer: Self-pay

## 2019-03-18 VITALS — BP 122/74 | HR 86 | Ht 64.0 in | Wt 213.0 lb

## 2019-03-18 DIAGNOSIS — I10 Essential (primary) hypertension: Secondary | ICD-10-CM | POA: Diagnosis not present

## 2019-03-18 DIAGNOSIS — E785 Hyperlipidemia, unspecified: Secondary | ICD-10-CM

## 2019-03-18 DIAGNOSIS — Z79899 Other long term (current) drug therapy: Secondary | ICD-10-CM

## 2019-03-18 DIAGNOSIS — I471 Supraventricular tachycardia: Secondary | ICD-10-CM

## 2019-03-18 DIAGNOSIS — I480 Paroxysmal atrial fibrillation: Secondary | ICD-10-CM

## 2019-03-18 DIAGNOSIS — I5043 Acute on chronic combined systolic (congestive) and diastolic (congestive) heart failure: Secondary | ICD-10-CM | POA: Diagnosis not present

## 2019-03-18 DIAGNOSIS — I25118 Atherosclerotic heart disease of native coronary artery with other forms of angina pectoris: Secondary | ICD-10-CM | POA: Diagnosis not present

## 2019-03-18 DIAGNOSIS — N183 Chronic kidney disease, stage 3 unspecified: Secondary | ICD-10-CM | POA: Diagnosis not present

## 2019-03-18 DIAGNOSIS — J984 Other disorders of lung: Secondary | ICD-10-CM

## 2019-03-18 LAB — CBC
HCT: 39 % (ref 39.0–52.0)
Hemoglobin: 13.1 g/dL (ref 13.0–17.0)
MCH: 28.1 pg (ref 26.0–34.0)
MCHC: 33.6 g/dL (ref 30.0–36.0)
MCV: 83.5 fL (ref 80.0–100.0)
Platelets: 167 10*3/uL (ref 150–400)
RBC: 4.67 MIL/uL (ref 4.22–5.81)
RDW: 14.9 % (ref 11.5–15.5)
WBC: 8.3 10*3/uL (ref 4.0–10.5)
nRBC: 0 % (ref 0.0–0.2)

## 2019-03-18 LAB — BASIC METABOLIC PANEL
Anion gap: 10 (ref 5–15)
BUN: 14 mg/dL (ref 8–23)
CO2: 24 mmol/L (ref 22–32)
Calcium: 9.1 mg/dL (ref 8.9–10.3)
Chloride: 104 mmol/L (ref 98–111)
Creatinine, Ser: 1.11 mg/dL (ref 0.61–1.24)
GFR calc Af Amer: >60 mL/min (ref >60)
GFR calc non Af Amer: >60 mL/min (ref >60)
Glucose, Bld: 112 mg/dL — ABNORMAL HIGH (ref 70–99)
Potassium: 3.9 mmol/L (ref 3.5–5.1)
Sodium: 138 mmol/L (ref 135–145)

## 2019-03-18 MED ORDER — TORSEMIDE 20 MG PO TABS: ORAL_TABLET | ORAL | 2 refills | Status: DC

## 2019-03-18 NOTE — Telephone Encounter (Signed)
Called and spoke to pt's spouse, Kalman Shan ([place]). Rose stated that in the mornings after wearing oxygen pt is experiencing nasal congestion.   LG, please advise as DK is unavailable.

## 2019-03-18 NOTE — Patient Instructions (Signed)
Medication Instructions:  Your physician has recommended you make the following change in your medication:  1- CHANGE Torsemide to 40 mg (2 tablets) by mouth every morning and take 20 mg (1 tablet) by mouth every afternoon.  *If you need a refill on your cardiac medications before your next appointment, please call your pharmacy*  Lab Work: 1- Your physician recommends that you return for lab work in: TODAY on the way out of the [facility]. BMET.  2- Your physician recommends that you return for lab work in: 2 weeks prior to coming down for the appointment.  BMET and CBC.  - Please go to the [facility]. You will check in at the front desk to the right as you walk into the atrium. Valet Parking is offered if needed. - No appointment needed. You may go any day between 7 am and 6 pm.   If you have labs (blood work) drawn today and your tests are completely normal, you will receive your results only by: [name] MyChart Message (if you have MyChart) OR . A paper copy in the mail If you have any lab test that is abnormal or we need to change your treatment, we will call you to review the results.  Testing/Procedures: none  Follow-Up: At [facility], you and your health needs are our priority.  As part of our continuing mission to provide you with exceptional heart care, we have created designated Provider Care Teams.  These Care Teams include your primary Cardiologist (physician) and Advanced Practice Providers (APPs -  Physician Assistants and Nurse Practitioners) who all work together to provide you with the care you need, when you need it.  Your next appointment:   2 week(s)  The format for your next appointment:   In Person  Provider:

## 2019-03-18 NOTE — Telephone Encounter (Signed)
ATC pt's spouse,Rose (DPR). No answer with no option to leave vm. Order has been placed to [facility] for humidifier bottle.

## 2019-03-18 NOTE — Progress Notes (Signed)
Follow-up Outpatient Visit Date: 03/18/2019  Primary Care Provider: [name], DO [address] [address]  Chief Complaint: Leg swelling and shortness of breath  HPI:  Lucas Caldwell is a 85 y.o. male with history of coronary artery disease, mixed ischemic and nonischemic cardiomyopathy, hypertension, hyperlipidemia, type 2 diabetes mellitus, paroxysmal atrial fibrillation, chronic kidney disease stage III, CML, iron deficiency anemia,GI bleed due to duodenal ulcers,BPH, and hypothyroidism, who presents for follow-up of coronary artery disease and paroxysmal atrial fibrillation.  I last saw him in early November for follow-up of sporadic chest pains and chronic shortness of breath.  At that time, Lucas Caldwell reported that his breathing had improved a little bit and he had not had any further chest pain.  He noted intermittent "vibrating" sensations in his chest lasting a few seconds to a few minutes at a time.  He also made note of occasional orthostatic lightheadedness.  Subsequent event monitor showed predominantly sinus rhythm with multiple runs of PSVT lasting up to 16 beats.  No atrial fibrillation was seen.  Interval PFTs showed moderate restrictive lung disease, felt to be related to his obesity.  Sleep study was recommended but has yet to be performed.  However, Lucas Caldwell is now an nocturnal oxygen, which has caused issues with nasal/sinus congestion.  Lucas Caldwell feels like his swelling has gradually worsened over the last 1-2 months.  He continues to have exertional dyspnea, slightly more pronounced than when we last saw each other.  He also have intermittent orthopnea, even when sitting back in his recliner.  He denies chest pain.  The transient "vibratory" feeling in his left chest is still present but seems less frequent and severe.  He denies bleeding.  --------------------------------------------------------------------------------------------------  Past  Medical History:  Diagnosis Date  . Arthritis    "probably in his legs" (08/15/2016)  . Blind right eye   . BPH (benign prostatic hyperplasia)   . Breast asymmetry    Left breast is larger, present for several years.  [name] CAD (coronary artery disease)    a. Cath in the late 90's - reportedly ok;  b. 2014 s/p stenting x 2 @ [facility]; c 08/19/16 Cath/PCI with DES -> RCA, plan to treat LM 70% medically. Seen by surgery and felt to be too high risk for CABG; d. 08/2018 Cath: LM 70 (iFR 0.86), LAD 20ost, 40p, 50/12m D1 70, LCX patent stent, OM1 30, RCA patent stent, 479mSR, 40d, RPDA 30. PCWP 8. CO/CI 4.0/2.1.  . Marland Kitchenhronic combined systolic (congestive) and diastolic (congestive) heart failure ([place]   a. Previously reduced EF-->50% by echo in 2012;  b. 06/2015 Echo: EF 50-55%  c. 07/2016 Echo: EF 45-50%; d. 12/2017 Echo: EF 55%, Gr1 DD, mild MR; e. 08/2018 Echo: EF 35-40%, mildly dil PA.  . Marland Kitchenhronic kidney disease (CKD), stage IV (severe) (HCE[place]  . CML (chronic myelocytic leukemia) ([facility]  . GERD (gastroesophageal reflux disease)   . GIB (gastrointestinal bleeding)    a. 12/2017 3 unit PRBC GIB in setting of coumadin-->Endo/colon multiple duodenal ulcers and a single bleeding ulcer in the proximal ascending colon status post hemostatic clipping x2.  . Gout   . Hyperlipemia   . Hypertension   . Hypothyroidism   . Iron deficiency anemia   . Ischemic cardiomyopathy    a. Previously reduced EF-->50% by echo in 2012;  b. 06/2015 Echo: EF 50-55%, Gr2 DD, mild MR, mildly dil LA, Ao sclerosis, mild TR;  c. 07/2016  Echo: EF 45-50%; d. 12/2017 Echo: EF 55%, Gr1 DD, mild MR; e. 08/2018 Echo: EF 35-40%.  . Migraine    "in the 1960s" (08/15/2016)  . PAF (paroxysmal atrial fibrillation) (HCC)    a. ? Dx 2014-->s/p DCCV;  b. CHA2DS2VASc = 6--> Coumadin.  . Prostate cancer ([place])   . RBBB   . Type II diabetes mellitus ([place])    Past Surgical History:  Procedure Laterality Date  . CATARACT EXTRACTION W/ INTRAOCULAR LENS   IMPLANT, BILATERAL Bilateral   . COLONOSCOPY N/A 01/13/2018   Procedure: COLONOSCOPY;  Surgeon: Lin Landsman, MD;  Location: [facility] ENDOSCOPY;  Service: Gastroenterology;  Laterality: N/A;  . COLONOSCOPY WITH PROPOFOL N/A 01/01/2018   Procedure: COLONOSCOPY WITH PROPOFOL;  Surgeon: Lin Landsman, MD;  Location: [facility] ENDOSCOPY;  Service: Gastroenterology;  Laterality: N/A;  . CORONARY ANGIOPLASTY WITH STENT PLACEMENT  09/2012   2 stents  . CORONARY STENT INTERVENTION N/A 08/19/2016   Procedure: Coronary Stent Intervention;  Surgeon: [name], MD;  Location: [place] CV LAB;  Service: Cardiovascular;  Laterality: N/A;  . ESOPHAGEAL MANOMETRY N/A 12/16/2017   Procedure: ESOPHAGEAL MANOMETRY (EM);  Surgeon: Lin Landsman, MD;  Location: [facility] ENDOSCOPY;  Service: Gastroenterology;  Laterality: N/A;  . ESOPHAGOGASTRODUODENOSCOPY N/A 12/20/2016   Procedure: ESOPHAGOGASTRODUODENOSCOPY (EGD);  Surgeon: Lin Landsman, MD;  Location: [facility] ENDOSCOPY;  Service: Gastroenterology;  Laterality: N/A;  . ESOPHAGOGASTRODUODENOSCOPY N/A 01/13/2018   Procedure: ESOPHAGOGASTRODUODENOSCOPY (EGD);  Surgeon: Lin Landsman, MD;  Location: [facility] ENDOSCOPY;  Service: Gastroenterology;  Laterality: N/A;  . ESOPHAGOGASTRODUODENOSCOPY (EGD) WITH PROPOFOL N/A 11/03/2017   Procedure: ESOPHAGOGASTRODUODENOSCOPY (EGD) WITH PROPOFOL;  Surgeon: Lin Landsman, MD;  Location: [facility] ENDOSCOPY;  Service: Gastroenterology;  Laterality: N/A;  . EYE SURGERY    . HERNIA REPAIR     "navel"  . LEFT HEART CATH AND CORONARY ANGIOGRAPHY N/A 08/14/2016   Procedure: Left Heart Cath and Coronary Angiography;  Surgeon: [name], MD;  Location: [place] CV LAB;  Service: Cardiovascular;  Laterality: N/A;  . RIGHT/LEFT HEART CATH AND CORONARY ANGIOGRAPHY N/A 09/01/2018   Procedure: RIGHT/LEFT HEART CATH AND CORONARY ANGIOGRAPHY;  Surgeon: [name], MD;  Location: [place] CV LAB;   Service: Cardiovascular;  Laterality: N/A;  . TOE AMPUTATION Right    "big toe"    Current Meds  Medication Sig  . ACCU-CHEK AVIVA PLUS test strip Use to check blood sugar up to 2 times daily  . Accu-Chek Softclix Lancets lancets Use to check blood sugar up to 2 times daily.  [name] acetaminophen (TYLENOL) 500 MG tablet Take 1,000 mg by mouth every 6 (six) hours as needed for moderate pain or headache.   . Alcohol Swabs 70 % PADS Use when checking blood sugar up to 3x daily  . allopurinol (ZYLOPRIM) 100 MG tablet TAKE ONE TABLET BY MOUTH ONCE DAILY  . Blood Glucose Monitoring Suppl (ACCU-CHEK AVIVA PLUS) w/Device KIT Use to check blood sugar up to 2 times daily  . bosutinib (BOSULIF) 100 MG tablet Take 100 mg by mouth at bedtime as needed (tired/not feeling well). Take with food.   . Dulaglutide (TRULICITY) 2.51 GF/8.4KJ SOPN Inject 0.75 mg into the skin once a week. (Patient taking differently: Inject 0.75 mg into the skin every Saturday. )  . gabapentin (NEURONTIN) 300 MG capsule Take 2 capsules (600 mg total) by mouth 2 (two) times daily. Max dose = 1243m daily for kidney function  . Insulin Glargine ([name] KWIKPEN) 100 UNIT/ML SOPN Inject 40 Units  into the skin daily.  . Insulin Pen Needle 31G X 5 MM MISC 31 g by Does not apply route as directed.  . isosorbide mononitrate (IMDUR) 60 MG 24 hr tablet Take 1.5 tablets (90 mg total) by mouth daily.  [name] levothyroxine (SYNTHROID) 25 MCG tablet TAKE 1 TABLET (25 MCG TOTAL) BY MOUTH DAILY BEFORE BREAKFAST.  [name] loperamide (IMODIUM A-D) 2 MG tablet Take 2 mg by mouth 3 (three) times daily as needed for diarrhea or loose stools.   . Menthol-Zinc Oxide (GOLD BOND EX) Apply 1 application topically daily as needed (muscle pain).  . metoprolol succinate (TOPROL XL) 25 MG 24 hr tablet Take 1 tablet (25 mg total) by mouth daily.  . Multiple Vitamins-Minerals (MULTIVITAMIN ADULTS 50+ PO) Take 1 tablet by mouth every morning.  . nitroGLYCERIN (NITROSTAT) 0.4  MG SL tablet Place 1 tablet (0.4 mg total) under the tongue every 5 (five) minutes x 3 doses as needed for chest pain.  [name] Place 1 drop into both eyes daily as needed (dry eyes).  . polyethylene glycol (MIRALAX / GLYCOLAX) 17 g packet Take 17 g by mouth daily as needed.   [name] PROVENTIL HFA 108 (90 Base) MCG/ACT inhaler Inhale 2 puffs into the lungs every 6 (six) hours as needed for wheezing or shortness of breath.  . ranolazine (RANEXA) 500 MG 12 hr tablet Take 1 tablet (500 mg total) by mouth 2 (two) times daily.  . rosuvastatin (CRESTOR) 5 MG tablet Take 1 tablet (5 mg total) by mouth daily. (Patient taking differently: Take 5 mg by mouth at bedtime. )  . senna (SENOKOT) 8.6 MG tablet Take 1 tablet by mouth daily as needed for constipation.  . traMADol (ULTRAM) 50 MG tablet Take 1 tablet (50 mg total) by mouth every 8 (eight) hours as needed for moderate pain.  [name] warfarin (COUMADIN) 3 MG tablet Take 1 tablet (3 mg total) by mouth as directed. Take as directed by the coumadin clinic.  . [DISCONTINUED] torsemide (DEMADEX) 20 MG tablet Take 1 tablet (20 mg total) by mouth daily. Increase to 20 mg two times a day for swelling or weight gain. (Patient taking differently: Take 20 mg by mouth 2 (two) times daily. Increase to 20 mg two times a day for swelling or weight gain.)    Allergies: Clopidogrel, Ciprofloxacin, Mirtazapine, and Spironolactone  Social History   Tobacco Use  . Smoking status: Former Smoker    Packs/day: 2.00    Years: 15.00    Pack years: 30.00    Types: Cigarettes    Quit date: 1979    Years since quitting: 42.0  . Smokeless tobacco: Former Systems developer  . Tobacco comment: quit 1979  Substance Use Topics  . Alcohol use: No    Comment: previously drank heavily - quit 1979.  . Drug use: No    Family History  Problem Relation Age of Onset  . Brain cancer Father   . Diabetes Sister   . Heart attack Sister     Review of Systems: A  12-system review of systems was performed and was negative except as noted in the HPI.  --------------------------------------------------------------------------------------------------  Physical Exam: BP 122/74 (BP Location: Left Arm, Patient Position: Sitting, Cuff Size: Normal)   Pulse 86   Ht '5\' 4"'  (1.626 m)   Wt 213 lb (96.6 kg)   SpO2 99%   BMI 36.56 kg/m   General:  NAD.  Accompanied by his wife. HEENT: No conjunctival pallor or  scleral icterus. Facemask in place. Neck: Supple without lymphadenopathy, thyromegaly.  Unable to assess JVP well due to body habitus. Lungs: Normal work of breathing. Mildly diminished breath sounds throughout without wheezes or crackles. Heart: Regular rate and rhythm without murmurs, rubs, or gallops. Abd: Bowel sounds present. Firm abdomen without tenderness.  Unable to assess HSM due to body habitus. Ext: 1+ right and 2+ left pretibial edema.  Trace right and 1+ left radial pulses. Skin: Warm and dry without rash.  EKG:  NSR with RBBB and LAFB (bifascicular block).  Borderline LVH.  Lab Results  Component Value Date   WBC 8.3 03/18/2019   HGB 13.1 03/18/2019   HCT 39.0 03/18/2019   MCV 83.5 03/18/2019   PLT 167 03/18/2019    Lab Results  Component Value Date   NA 138 03/18/2019   K 3.9 03/18/2019   CL 104 03/18/2019   CO2 24 03/18/2019   BUN 14 03/18/2019   CREATININE 1.11 03/18/2019   GLUCOSE 112 (H) 03/18/2019   ALT 14 08/28/2018    Lab Results  Component Value Date   CHOL 110 08/28/2018   HDL 28 (L) 08/28/2018   LDLCALC 51 08/28/2018   TRIG 155 (H) 08/28/2018   CHOLHDL 3.9 08/28/2018    --------------------------------------------------------------------------------------------------  ASSESSMENT AND PLAN: Coronary artery disease with stable angina: Lucas Caldwell denies chest pain but continues to have significant dyspnea (likley a combination of CAD, HF, and restrictive lung dz).  He asks about proceeding with PCI to the  LAD, as previously discussed.  I think this is reasonable, though I would like to optimize him from a volume standpoint first, as he is clearly volume overloaded today.  We will schedule him for LHC/PCI to the LAD when he returns for f/u in 2 weeks if his HF is better compensated.  Continue warfarin for now in lieu of aspirin.  Given history of clopidogrel intolerance and GI bleed, I may consider 3 months of ASA and ticagrelor following PCI (as he has not had any significant a-fib recently), after which I would probably switch to warfarin and aspirin.  Continue current doses of isosorbide mononitrate, metoprolol and ranolazine for now, though we will need to monitoring conduction closely, as PR is now borderline prolonged in the setting of bifascicular block.  This suggests reasonable likelihood that AV conduction will worsen to the point of needing PPM in the future.  Acute on chronic systolic and diastolic heart failure: Lucas Caldwell appears volume overloaded with NYHA class 3-4 HF.  I will check a BMP today with plans to increase torsemide to 40 mg QAM and 20 mg QPM.  He will need a BMP when he follows up in 2 weeks.  I will also check a CBC at that time in anticipation of subsequent cath.  PAF: No evidence of recurrence on recent monitor.  Continue current dose of metoprolol.  Warfarin per anticoagulation clinic.  PSVT: Brief atrial runs noted on recent monitor.  I am unsure if these episodes are responsible for "vibration" reported by Lucas Caldwell.  Continue current dose of metoprolol.  CKD stage III: Check BMP today with escalation of torsemide, as above.  Hypertension: BP normal today.  Hyperlipidemia: LDL at goal.  Continue rosuvastatin 5 mg daily.  Follow-up: RTC in 2 weeks with BMP and CBC at that time.  [name], MD 03/19/2019 10:47 AM

## 2019-03-18 NOTE — Telephone Encounter (Signed)
Ok for humidifier bottle.

## 2019-03-19 ENCOUNTER — Encounter: Payer: Self-pay | Admitting: Internal Medicine

## 2019-03-19 DIAGNOSIS — I5042 Chronic combined systolic (congestive) and diastolic (congestive) heart failure: Secondary | ICD-10-CM | POA: Insufficient documentation

## 2019-03-19 DIAGNOSIS — I471 Supraventricular tachycardia: Secondary | ICD-10-CM | POA: Insufficient documentation

## 2019-03-19 DIAGNOSIS — I5022 Chronic systolic (congestive) heart failure: Secondary | ICD-10-CM | POA: Insufficient documentation

## 2019-03-22 ENCOUNTER — Ambulatory Visit: Payer: Medicare HMO

## 2019-03-22 ENCOUNTER — Telehealth: Payer: Medicare HMO

## 2019-03-22 ENCOUNTER — Ambulatory Visit: Payer: Medicare HMO | Admitting: Family Medicine

## 2019-03-22 ENCOUNTER — Ambulatory Visit: Payer: Self-pay | Admitting: Pharmacist

## 2019-03-22 NOTE — Chronic Care Management (AMB) (Signed)
  Chronic Care Management   Follow Up Note   03/22/2019 Name: TAMER BAUGHMAN MRN: [id number] DOB: 25-Jun-1933  Referred by: [name], DO Reason for referral : Chronic Care Management (Patient Phone Call)   CHEE KINSLOW is a 86 y.o. year old male who is a primary care patient of [name], DO. The CCM team was consulted for assistance with chronic disease management and care coordination needs.    Was unable to reach patient via telephone today and have left HIPAA compliant voicemail asking patient to return my call.  Plan   The care management team will reach out to the patient again over the next 30 days.   [name], PharmD, [place] [employer] [phone]

## 2019-03-22 NOTE — Telephone Encounter (Signed)
Pt's spouse, Rose(DPR) is aware of below message and voiced her understanding. Nothing further is needed.

## 2019-03-23 ENCOUNTER — Other Ambulatory Visit: Payer: Self-pay | Admitting: Internal Medicine

## 2019-03-24 ENCOUNTER — Other Ambulatory Visit: Payer: Self-pay | Admitting: Family Medicine

## 2019-03-24 ENCOUNTER — Ambulatory Visit: Admit: 2019-03-24 | Discharge: 2019-03-25 | Payer: MEDICARE

## 2019-03-24 ENCOUNTER — Other Ambulatory Visit: Admit: 2019-03-24 | Discharge: 2019-03-25 | Payer: MEDICARE

## 2019-03-24 DIAGNOSIS — C9210 Chronic myeloid leukemia, BCR/ABL-positive, not having achieved remission: Principal | ICD-10-CM

## 2019-03-24 DIAGNOSIS — E1142 Type 2 diabetes mellitus with diabetic polyneuropathy: Secondary | ICD-10-CM

## 2019-03-25 ENCOUNTER — Other Ambulatory Visit: Payer: Self-pay | Admitting: Family Medicine

## 2019-03-25 DIAGNOSIS — E1142 Type 2 diabetes mellitus with diabetic polyneuropathy: Secondary | ICD-10-CM

## 2019-03-25 MED ORDER — GABAPENTIN 300 MG PO CAPS: 600.0000 mg | ORAL_CAPSULE | Freq: Two times a day (BID) | ORAL | 1 refills | Status: DC

## 2019-03-27 ENCOUNTER — Other Ambulatory Visit: Payer: Self-pay | Admitting: Internal Medicine

## 2019-03-31 ENCOUNTER — Other Ambulatory Visit: Payer: Self-pay | Admitting: Pharmacy Technician

## 2019-03-31 ENCOUNTER — Other Ambulatory Visit: Payer: Self-pay

## 2019-03-31 NOTE — Patient Outreach (Signed)
[facility]) Care Management  03/31/2019  LELON IKARD [birth year] [id number]  Care coordination call placed to [place] in regards to patient's application for [place] and Trulicity.  Spoke to [place] who informed they were missing the patient's proof of income. Informed Tillie Rung that we submitted the patient and his spouse's Diplomatic Services operational officer. Tillie Rung was able to find this information while on the line and said she would send it to processing. She informed the applications are processed in order they are received. She could not provide me a timeline of when to call back or if this application would be processed when originally submitted on 03/16/2019 or if they would use today as the submission date.   Will follow up with [employer] in 5-7 business days to inquire about status of application.  Marilena Trevathan P. Elver Stadler, [place] Management [phone]

## 2019-04-01 ENCOUNTER — Ambulatory Visit (INDEPENDENT_AMBULATORY_CARE_PROVIDER_SITE_OTHER): Payer: Medicare HMO | Admitting: Pharmacist

## 2019-04-01 DIAGNOSIS — I5032 Chronic diastolic (congestive) heart failure: Secondary | ICD-10-CM

## 2019-04-01 DIAGNOSIS — Z794 Long term (current) use of insulin: Secondary | ICD-10-CM | POA: Diagnosis not present

## 2019-04-01 DIAGNOSIS — E1121 Type 2 diabetes mellitus with diabetic nephropathy: Secondary | ICD-10-CM

## 2019-04-01 DIAGNOSIS — Z598 Other problems related to housing and economic circumstances: Secondary | ICD-10-CM

## 2019-04-01 DIAGNOSIS — Z599 Problem related to housing and economic circumstances, unspecified: Secondary | ICD-10-CM

## 2019-04-01 NOTE — Chronic Care Management (AMB) (Signed)
Chronic Care Management   Follow Up Note   04/01/2019 Name: Lucas Caldwell MRN: [id number] DOB: 1933-07-13  Referred by: Lucas Hauser, DO Reason for referral : Chronic Care Management (Patient Phone Call)   Lucas Caldwell is a 86 y.o. year old male who is a primary care patient of Lucas Hauser, DO. The CCM team was consulted for assistance with chronic disease management and care coordination needs.[name] has a past medical history including but not limited to type 2 diabetes, CKD, hyperlipidemia, COPD, atrial fibrillation, hypertension, coronary artery disease, Chronic HFpEF, hypothyroidism, and CML  I reached out to Lucas Caldwell and his wife by phone today.   Review of patient status, including review of consultants reports, relevant laboratory and other test results, and collaboration with appropriate care team members and the patient's provider was performed as part of comprehensive patient evaluation and provision of chronic care management services.   .   Outpatient Encounter Medications as of 04/01/2019  Medication Sig Note  . Dulaglutide (TRULICITY) 4.13 KG/4.0NU SOPN Inject 0.75 mg into the skin once a week. (Patient taking differently: Inject 0.75 mg into the skin every Saturday. )   . Insulin Glargine ([name] KWIKPEN) 100 UNIT/ML SOPN Inject 40 Units into the skin daily.   [name] torsemide (DEMADEX) 20 MG tablet Take 2 tablets (40 mg) by mouth every morning and take 1 tablet (20 mg) by mouth every afternoon.   [name] ACCU-CHEK AVIVA PLUS test strip Use to check blood sugar up to 2 times daily   . Accu-Chek Softclix Lancets lancets Use to check blood sugar up to 2 times daily.   [name] acetaminophen (TYLENOL) 500 MG tablet Take 1,000 mg by mouth every 6 (six) hours as needed for moderate pain or headache.    . Alcohol Swabs 70 % PADS Use when checking blood sugar up to 3x daily   . allopurinol (ZYLOPRIM) 100 MG tablet TAKE ONE TABLET BY MOUTH ONCE DAILY    . Blood Glucose Monitoring Suppl (ACCU-CHEK AVIVA PLUS) w/Device KIT Use to check blood sugar up to 2 times daily   . bosutinib (BOSULIF) 100 MG tablet Take 100 mg by mouth at bedtime as needed (tired/not feeling well). Take with food.  08/31/2018: Pt's wife states he only takes this as needed when he's "not feeling well"  . gabapentin (NEURONTIN) 300 MG capsule Take 2 capsules (600 mg total) by mouth 2 (two) times daily. Max dose = 1221m daily for kidney function   . Insulin Pen Needle 31G X 5 MM MISC 31 g by Does not apply route as directed.   . isosorbide mononitrate (IMDUR) 60 MG 24 hr tablet TAKE 1 AND 1/2 TABLETS BY MOUTH ONCE DAILY   . levothyroxine (SYNTHROID) 25 MCG tablet TAKE 1 TABLET (25 MCG TOTAL) BY MOUTH DAILY BEFORE BREAKFAST.   .Marland Kitchenloperamide (IMODIUM A-D) 2 MG tablet Take 2 mg by mouth 3 (three) times daily as needed for diarrhea or loose stools.    . Menthol-Zinc Oxide (GOLD BOND EX) Apply 1 application topically daily as needed (muscle pain).   . metoprolol succinate (TOPROL-XL) 25 MG 24 hr tablet TAKE ONE TABLET BY MOUTH ONCE DAILY   . Multiple Vitamins-Minerals (MULTIVITAMIN ADULTS 50+ PO) Take 1 tablet by mouth every morning.   . nitroGLYCERIN (NITROSTAT) 0.4 MG SL tablet Place 1 tablet (0.4 mg total) under the tongue every 5 (five) minutes x 3 doses as needed for chest pain.   .Lucas Caldwell (SYSTANE  OP) Place 1 drop into both eyes daily as needed (dry eyes).   . polyethylene Caldwell (MIRALAX / GLYCOLAX) 17 g packet Take 17 g by mouth daily as needed.    [name] PROVENTIL HFA 108 (90 Base) MCG/ACT inhaler Inhale 2 puffs into the lungs every 6 (six) hours as needed for wheezing or shortness of breath.   . ranolazine (RANEXA) 500 MG 12 hr tablet TAKE ONE TABLET BY MOUTH TWICE DAILY   . rosuvastatin (CRESTOR) 5 MG tablet Take 1 tablet (5 mg total) by mouth daily. (Patient taking differently: Take 5 mg by mouth at bedtime. )   . senna (SENOKOT) 8.6 MG tablet Take 1  tablet by mouth daily as needed for constipation.   . traMADol (ULTRAM) 50 MG tablet Take 1 tablet (50 mg total) by mouth every 8 (eight) hours as needed for moderate pain.   [name] warfarin (COUMADIN) 3 MG tablet Take 1 tablet (3 mg total) by mouth as directed. Take as directed by the coumadin clinic.    No facility-administered encounter medications on file as of 04/01/2019.    Goals Addressed            This Visit's Progress   . PharmD - Medication Managment       Current Barriers:  . Financial o Note patient has Limited Partial Extra Help Subsidy . Limited vision . Chronic Disease Management support and education needs related to multiple chronic disease processes  Pharmacist Clinical Goal(s):  [name] Over the next 30 days, patient will work with CM Pharmacist to address needs related to medication adherence and diabetes management  Interventions: . Perform chart review . Counsel on importance of medication adherence o Lucas Caldwell confirms patient taking torsemide to 40 mg QAM and 20 mg QPM as increased by Cardiologist at latest visit on 12/31 o Note follow up with Cardiology scheduled for tomorrow . Follow up with wife/patient regarding blood sugar monitoring and control o Reports patient currently taking: - [name] 40 untis daily - Trulicity 8.65 mg once weekly o Review recent CBG results (see below) o Denies any recent low blood sugar results of symptoms o Review again direction from PCP for [name] for patient to "titrate down by 2 units every 1 week for fasting CBG avg < 150" (Goal to get insulin basal dose down to 20-25 for now then future consider dose adjust Trulicity up to 1.5 if needed) - Lucas Caldwell verbalizes understanding via teach back method that patient is to decrease [name] dose to 38 units daily based on CBG results from past week & direction from PCP . Place referral to Care Guide for evaluation of available financial assistance o Lucas Caldwell asks about any  assistance that might available to help with patient's monthly oxygen bill. . Will continue to collaborate with [facility] CPhT Susy Frizzle for assistance with aiding patient with reapplying for patient assistance from Trulicity and Engineer, agricultural through [place] for 2021 calenadar year  Patient Self Care Activities:  . Check blood sugar, blood pressure and daily weight as directed by providers  Date Fasting Blood Glucose  8 - January 117  9 - January 144  10 - January 118  11 - January 157  12 - January 142  13 - January 144  14 - January 154  Average 139   . Takes medications, with assistance of wife, as directed . Patient attends scheduled medical appointments  Please see past updates related to this goal by clicking on the "Past Updates" button in  the selected goal         Plan  The care management team will reach out to the patient again over the next 30 days.   [name], PharmD, [place] [employer] [phone]

## 2019-04-01 NOTE — Patient Instructions (Signed)
Thank you allowing the Chronic Care Management Team to be a part of your care! It was a pleasure speaking with you today!     CCM (Chronic Care Management) Team    Noreene Larsson RN, MSN, CCM Nurse Care Coordinator  [phone]   [name] PharmD  Clinical Pharmacist  [phone]   Eula Fried LCSW Clinical Social Worker [phone]  Visit Information  Goals Addressed            This Visit's Progress   . PharmD - Medication Managment       Current Barriers:  . Financial o Note patient has Limited Partial Extra Help Subsidy . Limited vision . Chronic Disease Management support and education needs related to multiple chronic disease processes  Pharmacist Clinical Goal(s):  [name] Over the next 30 days, patient will work with CM Pharmacist to address needs related to medication adherence and diabetes management  Interventions: . Perform chart review . Counsel on importance of medication adherence o Mrs. Huish confirms patient taking torsemide to 40 mg QAM and 20 mg QPM as increased by Cardiologist at latest visit on 12/31 o Note follow up with Cardiology scheduled for tomorrow . Follow up with wife/patient regarding blood sugar monitoring and control o Reports patient currently taking: - [name] 40 untis daily - Trulicity 7.58 mg once weekly o Review recent CBG results (see below) o Denies any recent low blood sugar results of symptoms o Review again direction from PCP for [name] for patient to "titrate down by 2 units every 1 week for fasting CBG avg < 150" (Goal to get insulin basal dose down to 20-25 for now then future consider dose adjust Trulicity up to 1.5 if needed) - Mrs. Dobrowski verbalizes understanding via teach back method that patient is to decrease [name] dose to 38 units daily based on CBG results from past week & direction from PCP . Place referral to Care Guide for evaluation of available financial assistance o Mrs. Boesel asks about any  assistance that might available to help with patient's monthly oxygen bill. . Will continue to collaborate with [facility] CPhT Susy Frizzle for assistance with aiding patient with reapplying for patient assistance from Trulicity and [employer], agricultural through [employer] for 2021 calenadar year  Patient Self Care Activities:  . Check blood sugar, blood pressure and daily weight as directed by providers  Date Fasting Blood Glucose  8 - January 117  9 - January 144  10 - January 118  11 - January 157  12 - January 142  13 - January 144  14 - January 154  Average 139   . Takes medications, with assistance of wife, as directed . Patient attends scheduled medical appointments  Please see past updates related to this goal by clicking on the "Past Updates" button in the selected goal         The patient verbalized understanding of instructions provided today and declined a print copy of patient instruction materials.   The care management team will reach out to the patient again over the next 30 days.   [name], PharmD, [place] [employer] [phone]

## 2019-04-02 ENCOUNTER — Other Ambulatory Visit: Payer: Self-pay

## 2019-04-02 ENCOUNTER — Other Ambulatory Visit
Admission: RE | Admit: 2019-04-02 | Discharge: 2019-04-02 | Disposition: A | Discharge status: Home / Self Care | Payer: Medicare HMO | Attending: Internal Medicine | Admitting: Internal Medicine

## 2019-04-02 ENCOUNTER — Ambulatory Visit (INDEPENDENT_AMBULATORY_CARE_PROVIDER_SITE_OTHER): Payer: Medicare HMO | Admitting: Family

## 2019-04-02 ENCOUNTER — Encounter: Payer: Self-pay | Admitting: Family

## 2019-04-02 VITALS — BP 132/68 | HR 93 | Ht 64.0 in | Wt 208.1 lb

## 2019-04-02 DIAGNOSIS — E785 Hyperlipidemia, unspecified: Secondary | ICD-10-CM | POA: Diagnosis not present

## 2019-04-02 DIAGNOSIS — I10 Essential (primary) hypertension: Secondary | ICD-10-CM

## 2019-04-02 DIAGNOSIS — Z7901 Long term (current) use of anticoagulants: Secondary | ICD-10-CM

## 2019-04-02 DIAGNOSIS — I25118 Atherosclerotic heart disease of native coronary artery with other forms of angina pectoris: Secondary | ICD-10-CM | POA: Insufficient documentation

## 2019-04-02 DIAGNOSIS — I5043 Acute on chronic combined systolic (congestive) and diastolic (congestive) heart failure: Secondary | ICD-10-CM

## 2019-04-02 DIAGNOSIS — N183 Chronic kidney disease, stage 3 unspecified: Secondary | ICD-10-CM

## 2019-04-02 DIAGNOSIS — I480 Paroxysmal atrial fibrillation: Secondary | ICD-10-CM | POA: Diagnosis not present

## 2019-04-02 DIAGNOSIS — I471 Supraventricular tachycardia, unspecified: Secondary | ICD-10-CM

## 2019-04-02 DIAGNOSIS — I5022 Chronic systolic (congestive) heart failure: Secondary | ICD-10-CM | POA: Diagnosis not present

## 2019-04-02 LAB — BASIC METABOLIC PANEL
Anion gap: 11 (ref 5–15)
BUN: 20 mg/dL (ref 8–23)
CO2: 26 mmol/L (ref 22–32)
Calcium: 9.1 mg/dL (ref 8.9–10.3)
Chloride: 100 mmol/L (ref 98–111)
Creatinine, Ser: 1.54 mg/dL — ABNORMAL HIGH (ref 0.61–1.24)
GFR calc Af Amer: 48 mL/min — ABNORMAL LOW (ref >60)
GFR calc non Af Amer: 41 mL/min — ABNORMAL LOW (ref >60)
Glucose, Bld: 249 mg/dL — ABNORMAL HIGH (ref 70–99)
Potassium: 3.6 mmol/L (ref 3.5–5.1)
Sodium: 137 mmol/L (ref 135–145)

## 2019-04-02 MED ORDER — TORSEMIDE 20 MG PO TABS: 20.0000 mg | ORAL_TABLET | Freq: Two times a day (BID) | ORAL | 2 refills | Status: DC

## 2019-04-02 NOTE — H&P (View-Only) (Signed)
Office Visit    Patient Name: Lucas Caldwell Date of Encounter: 04/02/2019  Primary Care Provider:  [name], DO Primary Cardiologist:  [name], MD Electrophysiologist:  None   Chief Complaint    [name] is a 86 y.o. male with a hx of HFrEF, mixed ischemic and nonischemic cardiomyopathy, HTN, HLD, DM2, PAF, CKDIII, CML, iron deficiency anemia, GI bleed due to duodenal ulcers, BPH, hypothyroidism presents today for 2 week follow up of CAD and HF   Past Medical History    Past Medical History:  Diagnosis Date   Arthritis    "probably in his legs" (08/15/2016)   Blind right eye    BPH (benign prostatic hyperplasia)    Breast asymmetry    Left breast is larger, present for several years.   CAD (coronary artery disease)    a. Cath in the late 90's - reportedly ok;  b. 2014 s/p stenting x 2 @ [facility]; c 08/19/16 Cath/PCI with DES -> RCA, plan to treat LM 70% medically. Seen by surgery and felt to be too high risk for CABG; d. 08/2018 Cath: LM 70 (iFR 0.86), LAD 20ost, 40p, 50/60m D1 70, LCX patent stent, OM1 30, RCA patent stent, 48mSR, 40d, RPDA 30. PCWP 8. CO/CI 4.0/2.1.   Chronic combined systolic (congestive) and diastolic (congestive) heart failure ([facility]   a. Previously reduced EF-->50% by echo in 2012;  b. 06/2015 Echo: EF 50-55%  c. 07/2016 Echo: EF 45-50%; d. 12/2017 Echo: EF 55%, Gr1 DD, mild MR; e. 08/2018 Echo: EF 35-40%, mildly dil PA.   Chronic kidney disease (CKD), stage IV (severe) (HCC)    CML (chronic myelocytic leukemia) (HCC)    GERD (gastroesophageal reflux disease)    GIB (gastrointestinal bleeding)    a. 12/2017 3 unit PRBC GIB in setting of coumadin-->Endo/colon multiple duodenal ulcers and a single bleeding ulcer in the proximal ascending colon status post hemostatic clipping x2.   Gout    Hyperlipemia    Hypertension    Hypothyroidism    Iron deficiency anemia    Ischemic cardiomyopathy    a. Previously  reduced EF-->50% by echo in 2012;  b. 06/2015 Echo: EF 50-55%, Gr2 DD, mild MR, mildly dil LA, Ao sclerosis, mild TR;  c. 07/2016 Echo: EF 45-50%; d. 12/2017 Echo: EF 55%, Gr1 DD, mild MR; e. 08/2018 Echo: EF 35-40%.   Migraine    "in the 1960s" (08/15/2016)   PAF (paroxysmal atrial fibrillation) (HCC)    a. ? Dx 2014-->s/p DCCV;  b. CHA2DS2VASc = 6--> Coumadin.   Prostate cancer ([facility]   RBBB    Type II diabetes mellitus ([facility]   Past Surgical History:  Procedure Laterality Date   CATARACT EXTRACTION W/ INTRAOCULAR LENS  IMPLANT, BILATERAL Bilateral    COLONOSCOPY N/A 01/13/2018   Procedure: COLONOSCOPY;  Surgeon: VaLin LandsmanMD;  Location: [facility] ENDOSCOPY;  Service: Gastroenterology;  Laterality: N/A;   COLONOSCOPY WITH PROPOFOL N/A 01/01/2018   Procedure: COLONOSCOPY WITH PROPOFOL;  Surgeon: VaLin LandsmanMD;  [facility];  Service: Gastroenterology;  Laterality: N/A;   CORONARY ANGIOPLASTY WITH STENT PLACEMENT  09/2012   2 stents   CORONARY STENT INTERVENTION N/A 08/19/2016   Procedure: Coronary Stent Intervention;  Surgeon: EnNelva BushMD;  [facility];  Service: Cardiovascular;  Laterality: N/A;   ESOPHAGEAL MANOMETRY N/A 12/16/2017   Procedure: ESOPHAGEAL MANOMETRY (EM);  Surgeon: VaLin LandsmanMD;  [facility];  Service: Gastroenterology;  Laterality: N/A;   ESOPHAGOGASTRODUODENOSCOPY N/A 12/20/2016   Procedure: ESOPHAGOGASTRODUODENOSCOPY (EGD);  Surgeon: Lin Landsman, MD;  [facility]COPY;  Service: Gastroenterology;  Laterality: N/A;   ESOPHAGOGASTRODUODENOSCOPY N/A 01/13/2018   Procedure: ESOPHAGOGASTRODUODENOSCOPY (EGD);  Surgeon: Lin Landsman, MD;  Location: [facility] ENDOSCOPY;  Service: Gastroenterology;  Laterality: N/A;   ESOPHAGOGASTRODUODENOSCOPY (EGD) WITH PROPOFOL N/A 11/03/2017   Procedure: ESOPHAGOGASTRODUODENOSCOPY (EGD) WITH PROPOFOL;  Surgeon: Lin Landsman, MD;   Location: [facility] ENDOSCOPY;  Service: Gastroenterology;  Laterality: N/A;   EYE SURGERY     HERNIA REPAIR     "navel"   LEFT HEART CATH AND CORONARY ANGIOGRAPHY N/A 08/14/2016   Procedure: Left Heart Cath and Coronary Angiography;  Surgeon: [name], MD;  Location: [place] CV LAB;  Service: Cardiovascular;  Laterality: N/A;   RIGHT/LEFT HEART CATH AND CORONARY ANGIOGRAPHY N/A 09/01/2018   Procedure: RIGHT/LEFT HEART CATH AND CORONARY ANGIOGRAPHY;  Surgeon: [name], MD;  Location: [place] CV LAB;  Service: Cardiovascular;  Laterality: N/A;   TOE AMPUTATION Right    "big toe"    Allergies  Allergies  Allergen Reactions   Clopidogrel Anaphylaxis    altered mental status;  (electrolytes "out of wack" and confusion per family; THEY DO NOT REMEMBER ANY OTHER REACTION)- MSB 10/10/15   Ciprofloxacin Itching   Mirtazapine Diarrhea    Bad dreams    Spironolactone     gynecomastia    History of Present Illness    Lucas Caldwell is a 86 y.o. male with a hx of hx of HFrEF, mixed ischemic and nonischemic cardiomyopathy, HTN, HLD, DM2, PAF, CKDIII-IV, CML, iron deficiency anemia, GI bleed due to duodenal ulcers, BPH, hypothyroidism, noctural O2, Last seen by Dr. Saunders Revel 03/18/19.  CAD s/p RCA stent 08/2016. Cardiac cath 08/2018 70% LM stenosis with abnl iFR of 0.86, though drop in iFR was dueto diffuse prox LAD disease and only small gradient involving ostial LM. Imdur was increased. Echo 08/2018 LVEF 35-40%, indeterminite LV diastolic filling pressures, tricuspid AV, mildly dilated pulmonary artery, no significant valvular disfunction.   His Imdur has been increased with no real impact on his DOE per patient report. He has been appropriately diuresed still with DOE.   01/2019 visit with Dr. Saunders Revel notable for sporadic chest pain and SOB. Noted intermittent "vibrating" sensations in his chest and occasional orthostatic lightheadedness. Subsequent event monitor with SR  with multiple runs PSVT up to 16 beats with no evidence of atrial fib. Interval PFT with moderate restrictive lung disease, felt to be secondary to obesity. Recommended for sleep study but not yet performed. He has been started on nocturnal O2 which he wears 'when he feels he needs it'.  At visit 03/18/19 with Dr. Saunders Revel discussed proceeding with PCI to LAD. Dr. Saunders Revel was agreeable, but wanted to optimize volume first as he was noted to be volume overloaded with NYHA 3-4 HF. His Torsemide was increased from 9m BID to 419mQam and 2059mPM.   Labs 03/24/2019 via Care Everywhere with   Hb 12.8, Hct 38.1, wb 6.9, rbc 4.32  K 3.8, Cl 96, BUN 22, creatinine 1.59, GFR (af[place]) 46, AST 24, ALT 15  Since last seen he is down 5lb by our scale. Reports continued DOE and SOB with minimal activity such as bending over to tie his shoes or get dressed. Does report LE edema decreased. Sleeps in the bed at night 3 pillows. Wearing oxygen QHS PRN.   We again the discussed  the possibility of proceeding with cardiac catheretization with possible intervention to the LAD. [name] very interested in pursuing. Discussed with Dr. Saunders Revel in the office today regarding the patient's volume status, he is pleased with diuresis achieved.   Discussion regarding right and left cardiac catheterization with the patient and his wife. The patient understands that risks include but are not limited to stroke (1 in 1000), death (1 in 65), kidney failure [usually temporary] (1 in 500), bleeding (1 in 200), allergic reaction [possibly serious] (1 in 200), and agrees to proceed.    EKGs/Labs/Other Studies Reviewed:   The following studies were reviewed today: Long term monitor 01/22/19  The patient was monitored for 10 days, 16 hours. 8 days, 23 hours were adequate for analysis.  The predominant rhythm was sinus with an average rate of 83 bpm (range 59-128 bpm in sinus).  There were rare PAC's and PVC's.  Multiple (25)  atrial runs were observed, lasting up to 16 beats. The maximal rate was 174 bpm.  There was no sustained arrhythmia or prolonged pause.  Patient triggered events correspond to sinus rhythm and PVC's.   Predominantly sinus rhythm with rare PVC's.  Multiple brief runs of PSVT noted.  Echo 09/11/18  1. Severe hypokinesis of the left ventricular, entire inferior wall.  2. The left ventricle has moderately reduced systolic function, with an ejection fraction of 35-40%. The cavity size was normal. There is mildly increased left ventricular wall thickness. Left ventricular diastolic Doppler parameters are indeterminate.  Left ventricular diffuse hypokinesis.  3. The tricuspid valve is grossly normal.  4. The aortic valve is tricuspid.  5. Mildly dilated pulmonary artery.  6. The interatrial septum was not well visualized.  Right/left heart cath 09/01/18 Conclusions: 1. Multivessel CAD, including 60% ostial LMCA, diffuse proximal and mid LAD disease up to 60%, and 40% in-stent restenosis of more distal mid RCA stent. 2. Widely patent proximal RCA and LCx stents. 3. iFR of LMCA/LAD is significant at 0.86, though iFR pullback suggests that reduced flow is due to diffuse LAD disease with only a small pressure gradient involving the ostial LMCA. 4. Normal left and right heart filling pressures. 5. Mildly reduced Fick cardiac output/index. 6. Likely mildly reduced left ventricular systolic function (EF 95-63%), though evaluation is limited by suboptimal opacification of the ventricle.   Recommendations: 1. Optimize medical therapy; will increase isosorbide mononitrate to 30 mg daily and continue other antianginal medications. 2. Decrease torsemide to 40 mg QAM and 20 mg QPM. 3. Obtain echo as an outpatient to better assess LVEF and valves. 4. Follow-up in the office in 2 weeks with BMP at that time.  EKG:  EKG is ordered today.  The ekg ordered today demonstrates SR 93 bpm with bifascicular block  (RBBB and LAFB) with PR 188. Noted LVH.   Recent Labs: 06/03/2018: BNP 83.0 08/28/2018: ALT 14 03/18/2019: Hemoglobin 13.1; Platelets 167 04/02/2019: BUN 20; Creatinine, Ser 1.54; Potassium 3.6; Sodium 137  Recent Lipid Panel    Component Value Date/Time   CHOL 110 08/28/2018 1107   TRIG 155 (H) 08/28/2018 1107   HDL 28 (L) 08/28/2018 1107   CHOLHDL 3.9 08/28/2018 1107   VLDL 31 08/28/2018 1107   LDLCALC 51 08/28/2018 1107    Home Medications   Current Meds  Medication Sig   ACCU-CHEK AVIVA PLUS test strip Use to check blood sugar up to 2 times daily   Accu-Chek Softclix Lancets lancets Use to check blood sugar up to 2 times daily.  acetaminophen (TYLENOL) 500 MG tablet Take 1,000 mg by mouth every 6 (six) hours as needed for moderate pain or headache.    Alcohol Swabs 70 % PADS Use when checking blood sugar up to 3x daily   allopurinol (ZYLOPRIM) 100 MG tablet TAKE ONE TABLET BY MOUTH ONCE DAILY   Blood Glucose Monitoring Suppl (ACCU-CHEK AVIVA PLUS) w/Device KIT Use to check blood sugar up to 2 times daily   bosutinib (BOSULIF) 100 MG tablet Take 100 mg by mouth at bedtime as needed (tired/not feeling well). Take with food.    Dulaglutide (TRULICITY) 1.15 ZM/0.8YE SOPN Inject 0.75 mg into the skin once a week. (Patient taking differently: Inject 0.75 mg into the skin every Saturday. )   gabapentin (NEURONTIN) 300 MG capsule Take 2 capsules (600 mg total) by mouth 2 (two) times daily. Max dose = 1251m daily for kidney function   Insulin Glargine ([name] KWIKPEN) 100 UNIT/ML SOPN Inject 40 Units into the skin daily.   Insulin Pen Needle 31G X 5 MM MISC 31 g by Does not apply route as directed.   isosorbide mononitrate (IMDUR) 60 MG 24 hr tablet TAKE 1 AND 1/2 TABLETS BY MOUTH ONCE DAILY   levothyroxine (SYNTHROID) 25 MCG tablet TAKE 1 TABLET (25 MCG TOTAL) BY MOUTH DAILY BEFORE BREAKFAST.   loperamide (IMODIUM A-D) 2 MG tablet Take 2 mg by mouth 3 (three) times daily  as needed for diarrhea or loose stools.    Menthol-Zinc Oxide (GOLD BOND EX) Apply 1 application topically daily as needed (muscle pain).   metoprolol succinate (TOPROL-XL) 25 MG 24 hr tablet TAKE ONE TABLET BY MOUTH ONCE DAILY   Multiple Vitamins-Minerals (MULTIVITAMIN ADULTS 50+ PO) Take 1 tablet by mouth every morning.   nitroGLYCERIN (NITROSTAT) 0.4 MG SL tablet Place 1 tablet (0.4 mg total) under the tongue every 5 (five) minutes x 3 doses as needed for chest pain.   Polyethyl Glycol-Propyl Glycol (SYSTANE OP) Place 1 drop into both eyes daily as needed (dry eyes).   polyethylene glycol (MIRALAX / GLYCOLAX) 17 g packet Take 17 g by mouth daily as needed.    PROVENTIL HFA 108 (90 Base) MCG/ACT inhaler Inhale 2 puffs into the lungs every 6 (six) hours as needed for wheezing or shortness of breath.   ranolazine (RANEXA) 500 MG 12 hr tablet TAKE ONE TABLET BY MOUTH TWICE DAILY   rosuvastatin (CRESTOR) 5 MG tablet Take 1 tablet (5 mg total) by mouth daily. (Patient taking differently: Take 5 mg by mouth at bedtime. )   senna (SENOKOT) 8.6 MG tablet Take 1 tablet by mouth daily as needed for constipation.   torsemide (DEMADEX) 20 MG tablet Take 1 tablet (20 mg total) by mouth 2 (two) times daily.   traMADol (ULTRAM) 50 MG tablet Take 1 tablet (50 mg total) by mouth every 8 (eight) hours as needed for moderate pain.   warfarin (COUMADIN) 3 MG tablet Take 1 tablet (3 mg total) by mouth as directed. Take as directed by the coumadin clinic.   [DISCONTINUED] torsemide (DEMADEX) 20 MG tablet Take 2 tablets (40 mg) by mouth every morning and take 1 tablet (20 mg) by mouth every afternoon.      Review of Systems   Review of Systems  Constitution: Negative for chills, fever and malaise/fatigue.  Cardiovascular: Positive for dyspnea on exertion. Negative for chest pain, irregular heartbeat, leg swelling, near-syncope, orthopnea, palpitations and syncope.  Respiratory: Positive for shortness  of breath. Negative for cough and wheezing.  Gastrointestinal: Negative for melena, nausea and vomiting.  Genitourinary: Negative for hematuria.  Neurological: Negative for dizziness, light-headedness and weakness.   All other systems reviewed and are otherwise negative except as noted above.  Physical Exam    VS:  BP 132/68    Pulse 93    Ht '5\' 4"'  (1.626 m)    Wt 208 lb 2 oz (94.4 kg)    BMI 35.72 kg/m  , BMI Body mass index is 35.72 kg/m. GEN: Well nourished, well developed, in no acute distress. HEENT: normal. Neck: Supple, no JVD, carotid bruits, or masses. Cardiac: RRR, no murmurs, rubs, or gallops. No clubbing, cyanosis. He has no noted edema. PT 1+ and equal bilaterally. Trace right and 1+ left radial pulse.  Respiratory:  Respirations regular and unlabored, mildly diminished to auscultation bilaterally. GI: Abdomen is firn to touch, non tender. No pitting edema to the abd.  MS: No deformity or atrophy. Skin: Warm and dry, no rash. Neuro:  Strength and sensation are intact. Psych: Normal affect.  Assessment & Plan    1. CAD with stable angina - Prior PCI RCA and LCx. Continued stable anginal symptom of DOE despite improvement in volume status and up titration of Imdur. EKG today SR with bifasicular block with no acute ST/T wave changes. Will plan to proceed with left and right cardiac catheterization for possible stenting of LAD (known 40-60% stenosis 08/2018). Due to CKD he will arrive 4 hours prior for IVF. Plan of care was discussed with Dr. Saunders Revel in the office today.   GDMT includes beta blocker, statin, Ranexa, Imdur.   Antiplatelet/anticoagulant: Remains off aspirin due to Warfarin. Hx of Clopidogrel intolerance and GI bleed. Per Dr. Darnelle Bos last note may consider 3 mos ASA and ticagrelor post PCI (no significant atrial fib recently) and ultimately transition to [facility] post upcoming PCI/DES.  May benefit from cardiac rehab post PCI.  The patient understands that risks  include but are not limited to stroke (1 in 1000), death (1 in 75), kidney failure [usually temporary] (1 in 500), bleeding (1 in 200), allergic reaction [possibly serious] (1 in 200), and agrees to proceed.   2. Bifasicular heart block - PR today 188. Reasonable likelihood will require PPM in near future. Denies lightheadedness, dizziness, near syncope, syncope.   3. Chronic systolic and diastolic heart failure - Echo 09/11/18 LVEF 35-40%. Weight down 5lbs. Continues with NYHA III symptoms with DOE. GDMT includes beta blocker and torsemide. Kidney dysfunction presently precludes addition of ACE/ARB/Entresto/Spironolactone.   BMET today shows creatinine 1.54 and GFR 48. Reduce Torsemide to 61m AM and 224mPM. Repeat BMET 04/09/19.  Right heart cath ordered today, as above.   4. PAF - No evidence of recurrence on recent monitor 01/2019. Denies palpitations.  Continue Metoprolol and Warfarin.  5. Chronic anticoagulation - Secondary to PAF. On Warfarin followed by anticoagulation clinic.   6. PSVT - Brief runs on recent monitor 01/2019.   7. CKDIII - BMP today. Tells me he saw nephrology a number of years ago but does not follow with them. Careful titration of diuretics and antihypertensive therapies. Reduction of Torsemide, as above.   8. HTN - BP well controlled. Continue present antihypertensive regimen.  9. HLD - 08/28/18 LDL 51. LDL at goal <70. Normal liver function 03/24/19. Continue Rosuvastatin 53m93maily.   Disposition: COVID testing and BMET 04/09/19. Cardiac catheterization 04/13/2019. Follow up in 3 week(s) with Dr. EndSaunders Revel CaiLoel DubonnetP 04/02/2019, 7:25 PM

## 2019-04-02 NOTE — Progress Notes (Signed)
Office Visit    Patient Name: Lucas Caldwell Date of Encounter: 04/02/2019  Primary Care Provider:  [name], DO Primary Cardiologist:  [name], MD Electrophysiologist:  None   Chief Complaint    Lucas Caldwell is a 86 y.o. male with a hx of HFrEF, mixed ischemic and nonischemic cardiomyopathy, HTN, HLD, DM2, PAF, CKDIII, CML, iron deficiency anemia, GI bleed due to duodenal ulcers, BPH, hypothyroidism presents today for 2 week follow up of CAD and HF   Past Medical History    Past Medical History:  Diagnosis Date  . Arthritis    "probably in his legs" (08/15/2016)  . Blind right eye   . BPH (benign prostatic hyperplasia)   . Breast asymmetry    Left breast is larger, present for several years.  [name] CAD (coronary artery disease)    a. Cath in the late 90's - reportedly ok;  b. 2014 s/p stenting x 2 @ [facility]; c 08/19/16 Cath/PCI with DES -> RCA, plan to treat LM 70% medically. Seen by surgery and felt to be too high risk for CABG; d. 08/2018 Cath: LM 70 (iFR 0.86), LAD 20ost, 40p, 50/28m D1 70, LCX patent stent, OM1 30, RCA patent stent, 470mSR, 40d, RPDA 30. PCWP 8. CO/CI 4.0/2.1.  . Marland Kitchenhronic combined systolic (congestive) and diastolic (congestive) heart failure (HCTabor [place]   a. Previously reduced EF-->50% by echo in 2012;  b. 06/2015 Echo: EF 50-55%  c. 07/2016 Echo: EF 45-50%; d. 12/2017 Echo: EF 55%, Gr1 DD, mild MR; e. 08/2018 Echo: EF 35-40%, mildly dil PA.  . Marland Kitchenhronic kidney disease (CKD), stage IV (severe) (HCBuena Vista  . CML (chronic myelocytic leukemia) (HCFort Recovery  . GERD (gastroesophageal reflux disease)   . GIB (gastrointestinal bleeding)    a. 12/2017 3 unit PRBC GIB in setting of coumadin-->Endo/colon multiple duodenal ulcers and a single bleeding ulcer in the proximal ascending colon status post hemostatic clipping x2.  . Gout   . Hyperlipemia   . Hypertension   . Hypothyroidism   . Iron deficiency anemia   . Ischemic cardiomyopathy    a. Previously  reduced EF-->50% by echo in 2012;  b. 06/2015 Echo: EF 50-55%, Gr2 DD, mild MR, mildly dil LA, Ao sclerosis, mild TR;  c. 07/2016 Echo: EF 45-50%; d. 12/2017 Echo: EF 55%, Gr1 DD, mild MR; e. 08/2018 Echo: EF 35-40%.  . Migraine    "in the 1960s" (08/15/2016)  . PAF (paroxysmal atrial fibrillation) (HCC)    a. ? Dx 2014-->s/p DCCV;  b. CHA2DS2VASc = 6--> Coumadin.  . Prostate cancer (HCR[place]  . RBBB   . Type II diabetes mellitus (HCDan[place]   Past Surgical History:  Procedure Laterality Date  . CATARACT EXTRACTION W/ INTRAOCULAR LENS  IMPLANT, BILATERAL Bilateral   . COLONOSCOPY N/A 01/13/2018   Procedure: COLONOSCOPY;  Surgeon: VaLin LandsmanMD;  [facility];  Service: Gastroenterology;  Laterality: N/A;  . COLONOSCOPY WITH PROPOFOL N/A 01/01/2018   Procedure: COLONOSCOPY WITH PROPOFOL;  Surgeon: VaLin LandsmanMD;  [facility];  Service: Gastroenterology;  Laterality: N/A;  . CORONARY ANGIOPLASTY WITH STENT PLACEMENT  09/2012   2 stents  . CORONARY STENT INTERVENTION N/A 08/19/2016   Procedure: Coronary Stent Intervention;  Surgeon: EnNelva BushMD;  [facility];  Service: Cardiovascular;  Laterality: N/A;  . ESOPHAGEAL MANOMETRY N/A 12/16/2017   Procedure: ESOPHAGEAL MANOMETRY (EM);  Surgeon: VaLin LandsmanMD;  [facility];  Service: Gastroenterology;  Laterality: N/A;  . ESOPHAGOGASTRODUODENOSCOPY N/A 12/20/2016   Procedure: ESOPHAGOGASTRODUODENOSCOPY (EGD);  Surgeon: Lin Landsman, MD;  Location[facility] END[facility]COPY;  Service: Gastroenterology;  Laterality: N/A;  . ESOPHAGOGASTRODUODENOSCOPY N/A 01/13/2018   Procedure: ESOPHAGOGASTRODUODENOSCOPY (EGD);  Surgeon: Lin Landsman, MD;  Location[facility] END[facility]COPY;  Service: Gastroenterology;  Laterality: N/A;  . ESOPHAGOGASTRODUODENOSCOPY (EGD) WITH PROPOFOL N/A 11/03/2017   Procedure: ESOPHAGOGASTRODUODENOSCOPY (EGD) WITH PROPOFOL;  Surgeon: Lin Landsman, MD;   Location: [facility] ENDOSCOPY;  Service: Gastroenterology;  Laterality: N/A;  . EYE SURGERY    . HERNIA REPAIR     "navel"  . LEFT HEART CATH AND CORONARY ANGIOGRAPHY N/A 08/14/2016   Procedure: Left Heart Cath and Coronary Angiography;  Surgeon: [name], MD;  Location: [place] CV LAB;  Service: Cardiovascular;  Laterality: N/A;  . RIGHT/LEFT HEART CATH AND CORONARY ANGIOGRAPHY N/A 09/01/2018   Procedure: RIGHT/LEFT HEART CATH AND CORONARY ANGIOGRAPHY;  Surgeon: [name], MD;  Location: [place] CV LAB;  Service: Cardiovascular;  Laterality: N/A;  . TOE AMPUTATION Right    "big toe"    Allergies  Allergies  Allergen Reactions  . Clopidogrel Anaphylaxis    altered mental status;  (electrolytes "out of wack" and confusion per family; THEY DO NOT REMEMBER ANY OTHER REACTION)- MSB 10/10/15  . Ciprofloxacin Itching  . Mirtazapine Diarrhea    Bad dreams   . Spironolactone     gynecomastia    History of Present Illness    Lucas Caldwell is a 86 y.o. male with a hx of hx of HFrEF, mixed ischemic and nonischemic cardiomyopathy, HTN, HLD, DM2, PAF, CKDIII-IV, CML, iron deficiency anemia, GI bleed due to duodenal ulcers, BPH, hypothyroidism, noctural O2, Last seen by Dr. Saunders Revel 03/18/19.  CAD s/p RCA stent 08/2016. Cardiac cath 08/2018 70% LM stenosis with abnl iFR of 0.86, though drop in iFR was dueto diffuse prox LAD disease and only small gradient involving ostial LM. Imdur was increased. Echo 08/2018 LVEF 35-40%, indeterminite LV diastolic filling pressures, tricuspid AV, mildly dilated pulmonary artery, no significant valvular disfunction.   His Imdur has been increased with no real impact on his DOE per patient report. He has been appropriately diuresed still with DOE.   01/2019 visit with Dr. Saunders Revel notable for sporadic chest pain and SOB. Noted intermittent "vibrating" sensations in his chest and occasional orthostatic lightheadedness. Subsequent event monitor with SR  with multiple runs PSVT up to 16 beats with no evidence of atrial fib. Interval PFT with moderate restrictive lung disease, felt to be secondary to obesity. Recommended for sleep study but not yet performed. He has been started on nocturnal O2 which he wears 'when he feels he needs it'.  At visit 03/18/19 with Dr. Saunders Revel discussed proceeding with PCI to LAD. Dr. Saunders Revel was agreeable, but wanted to optimize volume first as he was noted to be volume overloaded with NYHA 3-4 HF. His Torsemide was increased from 36m BID to 476mQam and 2021mPM.   Labs 03/24/2019 via Care Everywhere with   Hb 12.8, Hct 38.1, wb 6.9, rbc 4.32  K 3.8, Cl 96, BUN 22, creatinine 1.59, GFR (african [place]) 46, AST 24, ALT 15  Since last seen he is down 5lb by our scale. Reports continued DOE and SOB with minimal activity such as bending over to tie his shoes or get dressed. Does report LE edema decreased. Sleeps in the bed at night 3 pillows. Wearing oxygen QHS PRN.   We again the discussed  the possibility of proceeding with cardiac catheretization with possible intervention to the LAD. [name] very interested in pursuing. Discussed with Dr. Saunders Revel in the office today regarding the patient's volume status, he is pleased with diuresis achieved.   Discussion regarding right and left cardiac catheterization with the patient and his wife. The patient understands that risks include but are not limited to stroke (1 in 1000), death (1 in 101), kidney failure [usually temporary] (1 in 500), bleeding (1 in 200), allergic reaction [possibly serious] (1 in 200), and agrees to proceed.    EKGs/Labs/Other Studies Reviewed:   The following studies were reviewed today: Long term monitor 01/22/19  The patient was monitored for 10 days, 16 hours. 8 days, 23 hours were adequate for analysis.  The predominant rhythm was sinus with an average rate of 83 bpm (range 59-128 bpm in sinus).  There were rare PAC's and PVC's.  Multiple (25)  atrial runs were observed, lasting up to 16 beats. The maximal rate was 174 bpm.  There was no sustained arrhythmia or prolonged pause.  Patient triggered events correspond to sinus rhythm and PVC's.   Predominantly sinus rhythm with rare PVC's.  Multiple brief runs of PSVT noted.  Echo 09/11/18  1. Severe hypokinesis of the left ventricular, entire inferior wall.  2. The left ventricle has moderately reduced systolic function, with an ejection fraction of 35-40%. The cavity size was normal. There is mildly increased left ventricular wall thickness. Left ventricular diastolic Doppler parameters are indeterminate.  Left ventricular diffuse hypokinesis.  3. The tricuspid valve is grossly normal.  4. The aortic valve is tricuspid.  5. Mildly dilated pulmonary artery.  6. The interatrial septum was not well visualized.  Right/left heart cath 09/01/18 Conclusions: 1. Multivessel CAD, including 60% ostial LMCA, diffuse proximal and mid LAD disease up to 60%, and 40% in-stent restenosis of more distal mid RCA stent. 2. Widely patent proximal RCA and LCx stents. 3. iFR of LMCA/LAD is significant at 0.86, though iFR pullback suggests that reduced flow is due to diffuse LAD disease with only a small pressure gradient involving the ostial LMCA. 4. Normal left and right heart filling pressures. 5. Mildly reduced Fick cardiac output/index. 6. Likely mildly reduced left ventricular systolic function (EF 85-88%), though evaluation is limited by suboptimal opacification of the ventricle.   Recommendations: 1. Optimize medical therapy; will increase isosorbide mononitrate to 30 mg daily and continue other antianginal medications. 2. Decrease torsemide to 40 mg QAM and 20 mg QPM. 3. Obtain echo as an outpatient to better assess LVEF and valves. 4. Follow-up in the office in 2 weeks with BMP at that time.  EKG:  EKG is ordered today.  The ekg ordered today demonstrates SR 93 bpm with bifascicular block  (RBBB and LAFB) with PR 188. Noted LVH.   Recent Labs: 06/03/2018: BNP 83.0 08/28/2018: ALT 14 03/18/2019: Hemoglobin 13.1; Platelets 167 04/02/2019: BUN 20; Creatinine, Ser 1.54; Potassium 3.6; Sodium 137  Recent Lipid Panel    Component Value Date/Time   CHOL 110 08/28/2018 1107   TRIG 155 (H) 08/28/2018 1107   HDL 28 (L) 08/28/2018 1107   CHOLHDL 3.9 08/28/2018 1107   VLDL 31 08/28/2018 1107   LDLCALC 51 08/28/2018 1107    Home Medications   Current Meds  Medication Sig  . ACCU-CHEK AVIVA PLUS test strip Use to check blood sugar up to 2 times daily  . Accu-Chek Softclix Lancets lancets Use to check blood sugar up to 2 times daily.  [name]  acetaminophen (TYLENOL) 500 MG tablet Take 1,000 mg by mouth every 6 (six) hours as needed for moderate pain or headache.   . Alcohol Swabs 70 % PADS Use when checking blood sugar up to 3x daily  . allopurinol (ZYLOPRIM) 100 MG tablet TAKE ONE TABLET BY MOUTH ONCE DAILY  . Blood Glucose Monitoring Suppl (ACCU-CHEK AVIVA PLUS) w/Device KIT Use to check blood sugar up to 2 times daily  . bosutinib (BOSULIF) 100 MG tablet Take 100 mg by mouth at bedtime as needed (tired/not feeling well). Take with food.   . Dulaglutide (TRULICITY) 2.99 BZ/1.6RC SOPN Inject 0.75 mg into the skin once a week. (Patient taking differently: Inject 0.75 mg into the skin every Saturday. )  . gabapentin (NEURONTIN) 300 MG capsule Take 2 capsules (600 mg total) by mouth 2 (two) times daily. Max dose = 1265m daily for kidney function  . Insulin Glargine ([name] KWIKPEN) 100 UNIT/ML SOPN Inject 40 Units into the skin daily.  . Insulin Pen Needle 31G X 5 MM MISC 31 g by Does not apply route as directed.  . isosorbide mononitrate (IMDUR) 60 MG 24 hr tablet TAKE 1 AND 1/2 TABLETS BY MOUTH ONCE DAILY  . levothyroxine (SYNTHROID) 25 MCG tablet TAKE 1 TABLET (25 MCG TOTAL) BY MOUTH DAILY BEFORE BREAKFAST.  .Marland Kitchenloperamide (IMODIUM A-D) 2 MG tablet Take 2 mg by mouth 3 (three) times daily  as needed for diarrhea or loose stools.   . Menthol-Zinc Oxide (GOLD BOND EX) Apply 1 application topically daily as needed (muscle pain).  . metoprolol succinate (TOPROL-XL) 25 MG 24 hr tablet TAKE ONE TABLET BY MOUTH ONCE DAILY  . Multiple Vitamins-Minerals (MULTIVITAMIN ADULTS 50+ PO) Take 1 tablet by mouth every morning.  . nitroGLYCERIN (NITROSTAT) 0.4 MG SL tablet Place 1 tablet (0.4 mg total) under the tongue every 5 (five) minutes x 3 doses as needed for chest pain.  .[name] (SYSTANE OP) Place 1 drop into both eyes daily as needed (dry eyes).  . polyethylene glycol (MIRALAX / GLYCOLAX) 17 g packet Take 17 g by mouth daily as needed.   .Marland KitchenPROVENTIL HFA 108 (90 Base) MCG/ACT inhaler Inhale 2 puffs into the lungs every 6 (six) hours as needed for wheezing or shortness of breath.  . ranolazine (RANEXA) 500 MG 12 hr tablet TAKE ONE TABLET BY MOUTH TWICE DAILY  . rosuvastatin (CRESTOR) 5 MG tablet Take 1 tablet (5 mg total) by mouth daily. (Patient taking differently: Take 5 mg by mouth at bedtime. )  . senna (SENOKOT) 8.6 MG tablet Take 1 tablet by mouth daily as needed for constipation.  . torsemide (DEMADEX) 20 MG tablet Take 1 tablet (20 mg total) by mouth 2 (two) times daily.  . traMADol (ULTRAM) 50 MG tablet Take 1 tablet (50 mg total) by mouth every 8 (eight) hours as needed for moderate pain.  .Marland Kitchenwarfarin (COUMADIN) 3 MG tablet Take 1 tablet (3 mg total) by mouth as directed. Take as directed by the coumadin clinic.  . [DISCONTINUED] torsemide (DEMADEX) 20 MG tablet Take 2 tablets (40 mg) by mouth every morning and take 1 tablet (20 mg) by mouth every afternoon.      Review of Systems   Review of Systems  Constitution: Negative for chills, fever and malaise/fatigue.  Cardiovascular: Positive for dyspnea on exertion. Negative for chest pain, irregular heartbeat, leg swelling, near-syncope, orthopnea, palpitations and syncope.  Respiratory: Positive for shortness  of breath. Negative for cough and wheezing.  Gastrointestinal: Negative for melena, nausea and vomiting.  Genitourinary: Negative for hematuria.  Neurological: Negative for dizziness, light-headedness and weakness.   All other systems reviewed and are otherwise negative except as noted above.  Physical Exam    VS:  BP 132/68   Pulse 93   Ht '5\' 4"'  (1.626 m)   Wt 208 lb 2 oz (94.4 kg)   BMI 35.72 kg/m  , BMI Body mass index is 35.72 kg/m. GEN: Well nourished, well developed, in no acute distress. HEENT: normal. Neck: Supple, no JVD, carotid bruits, or masses. Cardiac: RRR, no murmurs, rubs, or gallops. No clubbing, cyanosis. He has no noted edema. PT 1+ and equal bilaterally. Trace right and 1+ left radial pulse.  Respiratory:  Respirations regular and unlabored, mildly diminished to auscultation bilaterally. GI: Abdomen is firn to touch, non tender. No pitting edema to the abd.  MS: No deformity or atrophy. Skin: Warm and dry, no rash. Neuro:  Strength and sensation are intact. Psych: Normal affect.  Assessment & Plan    1. CAD with stable angina - Prior PCI RCA and LCx. Continued stable anginal symptom of DOE despite improvement in volume status and up titration of Imdur. EKG today SR with bifasicular block with no acute ST/T wave changes. Will plan to proceed with left and right cardiac catheterization for possible stenting of LAD (known 40-60% stenosis 08/2018). Due to CKD he will arrive 4 hours prior for IVF. Plan of care was discussed with Dr. Saunders Revel in the office today.   GDMT includes beta blocker, statin, Ranexa, Imdur.   Antiplatelet/anticoagulant: Remains off aspirin due to Warfarin. Hx of Clopidogrel intolerance and GI bleed. Per Dr. Darnelle Bos last note may consider 3 mos ASA and ticagrelor post PCI (no significant atrial fib recently) and ultimately transition to [facility] post upcoming PCI/DES.  May benefit from cardiac rehab post PCI.  The patient understands that risks  include but are not limited to stroke (1 in 1000), death (1 in 17), kidney failure [usually temporary] (1 in 500), bleeding (1 in 200), allergic reaction [possibly serious] (1 in 200), and agrees to proceed.   2. Bifasicular heart block - PR today 188. Reasonable likelihood will require PPM in near future. Denies lightheadedness, dizziness, near syncope, syncope.   3. Chronic systolic and diastolic heart failure - Echo 09/11/18 LVEF 35-40%. Weight down 5lbs. Continues with NYHA III symptoms with DOE. GDMT includes beta blocker and torsemide. Kidney dysfunction presently precludes addition of ACE/ARB/Entresto/Spironolactone.   BMET today shows creatinine 1.54 and GFR 48. Reduce Torsemide to 24m AM and 2104mPM. Repeat BMET 04/09/19.  Right heart cath ordered today, as above.   4. PAF - No evidence of recurrence on recent monitor 01/2019. Denies palpitations.  Continue Metoprolol and Warfarin.  5. Chronic anticoagulation - Secondary to PAF. On Warfarin followed by anticoagulation clinic.   6. PSVT - Brief runs on recent monitor 01/2019.   7. CKDIII - BMP today. Tells me he saw nephrology a number of years ago but does not follow with them. Careful titration of diuretics and antihypertensive therapies. Reduction of Torsemide, as above.   8. HTN - BP well controlled. Continue present antihypertensive regimen.  9. HLD - 08/28/18 LDL 51. LDL at goal <70. Normal liver function 03/24/19. Continue Rosuvastatin 43m57maily.   Disposition: COVID testing and BMET 04/09/19. Cardiac catheterization 04/13/2019. Follow up in 3 week(s) with Dr. EndSaunders Revel CaiLoel DubonnetP 04/02/2019, 7:25 PM

## 2019-04-02 NOTE — Patient Instructions (Addendum)
Medication Instructions:  1- DECREASE Torsemide to 1 tablet ( 20 mg total) twice daily  *If you need a refill on your cardiac medications before your next appointment, please call your pharmacy*  Lab Work: 1- Your physician recommends that you return for lab work in:  (1/22) at the medical mall. No appt is needed. Hours are M-F 7AM- 6 PM.  2- CV19 Pre admit testing DRIVE THRU  Please report to the PAT testing site (medical arts building) on _____1/22____ date ______12:30-2:30PM_____ time for your DRIVE THRU covid testing that is required prior to your procedure.  Following covid testing, please remain in quarantine. If you must be around others, please wash hands, avoid touching face and wear your mask.   If you have labs (blood work) drawn today and your tests are completely normal, you will receive your results only by: [name] MyChart Message (if you have MyChart) OR . A paper copy in the mail If you have any lab test that is abnormal or we need to change your treatment, we will call you to review the results.  Testing/Procedures:    [place] [address], [address] [address] Dept: [place]: [phone]  SAGE KOPERA  04/02/2019  You are scheduled for a Cardiac Catheterization on Tuesday, January 26 with Dr. [name] End.  1. Please arrive at the medical mall of [facility] at 6:30 AM (This time is 4 hours before your procedure to ensure your preparation). Free valet parking service is available.   Special note: Every effort is made to have your procedure done on time. Please understand that emergencies sometimes delay scheduled procedures.  2. Diet: Do not eat solid foods after midnight.  The patient may have clear liquids until 5am upon the day of the procedure.  3. Labs:See note  4. Medication instructions in preparation for your procedure:   Contrast Allergy: No Stop  taking Coumadin (Warfarin) on Thursday, January 21. Take only 1/2 Dose units of insulin the night before your procedure. Do not take any insulin on the day of the procedure.  Hold Torsemide on the morning of procedure.  On the morning of your procedure, take your Aspirin and any morning medicines NOT listed above.  You may use sips of water.  5. Plan for one night stay--bring personal belongings. 6. Bring a current list of your medications and current insurance cards. 7. You MUST have a responsible person to drive you home. 8. Someone MUST be with you the first 24 hours after you arrive home or your discharge will be delayed. 9. Please wear clothes that are easy to get on and off and wear slip-on shoes.  Thank you for allowing [place] to care for you!   -- [place] [facility]   Follow-Up: At [facility], you and your health needs are our priority.  As part of our continuing mission to provide you with exceptional heart care, we have created designated Provider Care Teams.  These Care Teams include your primary Cardiologist (physician) and Advanced Practice Providers (APPs -  Physician Assistants and Nurse Practitioners) who all work together to provide you with the care you need, when you need it.  Your next appointment:   3 week(s)  The format for your next appointment:   In Person  Provider:    You may see [name], MD or one of the following Advanced Practice Providers on your designated Care Team:  [name], NP  [name], PA-C  [name], PA-C

## 2019-04-05 ENCOUNTER — Other Ambulatory Visit: Payer: Self-pay | Admitting: Pharmacy Technician

## 2019-04-05 ENCOUNTER — Other Ambulatory Visit: Payer: Self-pay

## 2019-04-05 ENCOUNTER — Ambulatory Visit (INDEPENDENT_AMBULATORY_CARE_PROVIDER_SITE_OTHER): Payer: Medicare HMO

## 2019-04-05 DIAGNOSIS — Z7901 Long term (current) use of anticoagulants: Secondary | ICD-10-CM | POA: Diagnosis not present

## 2019-04-05 DIAGNOSIS — I480 Paroxysmal atrial fibrillation: Secondary | ICD-10-CM | POA: Diagnosis not present

## 2019-04-05 LAB — POCT INR: INR: 2.2 (ref 2.0–3.0)

## 2019-04-05 NOTE — Patient Instructions (Signed)
Per Dr. Saunders Revel, hold your warfarin 5 days prior to your cath on 1/26. If ok'd by him after your procedure, resume your warfarin that evening w/ extra 1/2 dosage for 2 days, then resume previous dosage of 1 TABLET EVERY DAY EXCEPT 1.5 TABLETS ON MONDAYS & FRIDAYS. Recheck INR in 2 weeks.

## 2019-04-05 NOTE — Patient Outreach (Signed)
[facility]) Care Management  04/05/2019  Lucas Caldwell May 13, 1933 [id number]  Care coordination call placed to [place] in regards to patient's application for [place] and Trulicity.  Spoke to Lucas Caldwell who informed they were missing the patient's proof of income. Informed Lucas Caldwell this is the same thing the last representative said and she informed she would send it to processing. Lucas Caldwell apologized and said she would attach the information to his account and then she would email the processing team and request a re process of the application. She informed that could take up to 48 hours.  Will follow up with Lilly in 3-7 business days to inquire if a determination has been made.  Lucas Caldwell Lucas Caldwell, [place] Management [phone]

## 2019-04-06 ENCOUNTER — Ambulatory Visit: Payer: Medicare HMO | Admitting: Internal Medicine

## 2019-04-09 ENCOUNTER — Other Ambulatory Visit: Admission: RE | Admit: 2019-04-09 | Payer: Medicare HMO | Source: Ambulatory Visit

## 2019-04-12 ENCOUNTER — Other Ambulatory Visit: Payer: Self-pay | Admitting: Pharmacy Technician

## 2019-04-12 ENCOUNTER — Other Ambulatory Visit
Admission: RE | Admit: 2019-04-12 | Discharge: 2019-04-12 | Disposition: A | Discharge status: Home / Self Care | Payer: Medicare HMO | Source: Ambulatory Visit | Attending: Internal Medicine | Admitting: Internal Medicine

## 2019-04-12 ENCOUNTER — Ambulatory Visit: Payer: Self-pay | Admitting: Pharmacist

## 2019-04-12 DIAGNOSIS — I428 Other cardiomyopathies: Secondary | ICD-10-CM | POA: Diagnosis present

## 2019-04-12 DIAGNOSIS — Z794 Long term (current) use of insulin: Secondary | ICD-10-CM | POA: Diagnosis not present

## 2019-04-12 DIAGNOSIS — I480 Paroxysmal atrial fibrillation: Secondary | ICD-10-CM | POA: Diagnosis present

## 2019-04-12 DIAGNOSIS — I2510 Atherosclerotic heart disease of native coronary artery without angina pectoris: Secondary | ICD-10-CM | POA: Diagnosis present

## 2019-04-12 DIAGNOSIS — K219 Gastro-esophageal reflux disease without esophagitis: Secondary | ICD-10-CM | POA: Diagnosis present

## 2019-04-12 DIAGNOSIS — Z598 Other problems related to housing and economic circumstances: Secondary | ICD-10-CM

## 2019-04-12 DIAGNOSIS — D509 Iron deficiency anemia, unspecified: Secondary | ICD-10-CM | POA: Diagnosis present

## 2019-04-12 DIAGNOSIS — T82855A Stenosis of coronary artery stent, initial encounter: Secondary | ICD-10-CM | POA: Diagnosis present

## 2019-04-12 DIAGNOSIS — I25110 Atherosclerotic heart disease of native coronary artery with unstable angina pectoris: Secondary | ICD-10-CM | POA: Diagnosis present

## 2019-04-12 DIAGNOSIS — E039 Hypothyroidism, unspecified: Secondary | ICD-10-CM | POA: Diagnosis present

## 2019-04-12 DIAGNOSIS — I5022 Chronic systolic (congestive) heart failure: Secondary | ICD-10-CM | POA: Diagnosis not present

## 2019-04-12 DIAGNOSIS — M109 Gout, unspecified: Secondary | ICD-10-CM | POA: Diagnosis present

## 2019-04-12 DIAGNOSIS — E785 Hyperlipidemia, unspecified: Secondary | ICD-10-CM | POA: Diagnosis present

## 2019-04-12 DIAGNOSIS — E1121 Type 2 diabetes mellitus with diabetic nephropathy: Secondary | ICD-10-CM

## 2019-04-12 DIAGNOSIS — Z20822 Contact with and (suspected) exposure to covid-19: Secondary | ICD-10-CM | POA: Diagnosis present

## 2019-04-12 DIAGNOSIS — E1140 Type 2 diabetes mellitus with diabetic neuropathy, unspecified: Secondary | ICD-10-CM | POA: Diagnosis present

## 2019-04-12 DIAGNOSIS — I5043 Acute on chronic combined systolic (congestive) and diastolic (congestive) heart failure: Secondary | ICD-10-CM | POA: Diagnosis present

## 2019-04-12 DIAGNOSIS — I452 Bifascicular block: Secondary | ICD-10-CM | POA: Diagnosis present

## 2019-04-12 DIAGNOSIS — I200 Unstable angina: Secondary | ICD-10-CM | POA: Diagnosis not present

## 2019-04-12 DIAGNOSIS — H5461 Unqualified visual loss, right eye, normal vision left eye: Secondary | ICD-10-CM | POA: Diagnosis present

## 2019-04-12 DIAGNOSIS — I25118 Atherosclerotic heart disease of native coronary artery with other forms of angina pectoris: Secondary | ICD-10-CM | POA: Diagnosis not present

## 2019-04-12 DIAGNOSIS — I130 Hypertensive heart and chronic kidney disease with heart failure and stage 1 through stage 4 chronic kidney disease, or unspecified chronic kidney disease: Secondary | ICD-10-CM | POA: Diagnosis present

## 2019-04-12 DIAGNOSIS — N400 Enlarged prostate without lower urinary tract symptoms: Secondary | ICD-10-CM | POA: Diagnosis present

## 2019-04-12 DIAGNOSIS — E1122 Type 2 diabetes mellitus with diabetic chronic kidney disease: Secondary | ICD-10-CM | POA: Diagnosis present

## 2019-04-12 DIAGNOSIS — J984 Other disorders of lung: Secondary | ICD-10-CM | POA: Diagnosis present

## 2019-04-12 DIAGNOSIS — C9210 Chronic myeloid leukemia, BCR/ABL-positive, not having achieved remission: Secondary | ICD-10-CM | POA: Diagnosis present

## 2019-04-12 DIAGNOSIS — Z599 Problem related to housing and economic circumstances, unspecified: Secondary | ICD-10-CM

## 2019-04-12 DIAGNOSIS — M1990 Unspecified osteoarthritis, unspecified site: Secondary | ICD-10-CM | POA: Diagnosis present

## 2019-04-12 DIAGNOSIS — I255 Ischemic cardiomyopathy: Secondary | ICD-10-CM | POA: Diagnosis present

## 2019-04-12 DIAGNOSIS — Z6835 Body mass index (BMI) 35.0-35.9, adult: Secondary | ICD-10-CM | POA: Diagnosis not present

## 2019-04-12 DIAGNOSIS — E669 Obesity, unspecified: Secondary | ICD-10-CM | POA: Diagnosis present

## 2019-04-12 DIAGNOSIS — N183 Chronic kidney disease, stage 3 unspecified: Secondary | ICD-10-CM | POA: Diagnosis present

## 2019-04-12 DIAGNOSIS — N1832 Chronic kidney disease, stage 3b: Secondary | ICD-10-CM | POA: Diagnosis not present

## 2019-04-12 LAB — SARS CORONAVIRUS 2 (TAT 6-24 HRS): SARS Coronavirus 2: NEGATIVE

## 2019-04-12 NOTE — Patient Outreach (Signed)
[facility]) Care Management  04/12/2019  Lucas Caldwell 04/17/1933 [id number]    Care coordination call placed to [place] in regards to patient's application for Trulicity and [name].  Spoke to [name] who informed the appliciaton was missing the proof of income. Informed [name] that this was my 3rd phone call to [place] and informed her that the proof of income was submitted on 35/52/1747 when the application was originally sent in. [name] apologized for the processor's error when processing the application and informed she would send it to the team to have them process it today if possible. However, she informed she had no turn around time as to when the application would be processed.  Will follow up with [employer] in 3-5 business days.  [name] P. Zyair Rhein, [place] Management [phone]

## 2019-04-12 NOTE — Patient Instructions (Signed)
Thank you allowing the Chronic Care Management Team to be a part of your care! It was a pleasure speaking with you today!     CCM (Chronic Care Management) Team    Noreene Larsson RN, MSN, CCM Nurse Care Coordinator  [phone]   [name] PharmD  Clinical Pharmacist  [phone]   Eula Fried LCSW Clinical Social Worker [phone]  Visit Information  Goals Addressed            This Visit's Progress   . PharmD - Medication Managment       Current Barriers:  . Financial o Note patient has Limited Partial Extra Help Subsidy . Limited vision . Chronic Disease Management support and education needs related to multiple chronic disease processes  Pharmacist Clinical Goal(s):  [name] Over the next 30 days, patient will work with CM Pharmacist to address needs related to medication adherence and diabetes management  Interventions: . Receive call from [place] Simcox. Reports patient's application for [id number] re-enrollment in [place] patient assistance program for Trulicity and Nancee Liter is still in processing, not yet approved.  o Reports patient has only one dose of Trulicity 0.97 mg remaining, due to be given on 1/30. Does not currently have dose for 2/6. [name] Follow up call to patient/caregiver to also check on patient's supply of [name]. Speak with Mrs. Escher who reports patient has 4 boxes of [name] remaining. [name] Collaborate with office regarding availability of samples for patient. Office is currently out of Trulicity 3.53 mg samples.  . Let Mrs. Bays know that I or the office will follow up once we hear back from [employer].  Patient Self Care Activities:  . Check blood sugar, blood pressure and daily weight as directed by providers  . Takes medications, with assistance of wife, as directed . Patient attends scheduled medical appointments  Please see past updates related to this goal by clicking on the "Past Updates" button in the selected goal          The patient verbalized understanding of instructions provided today and declined a print copy of patient instruction materials.   The care management team will reach out to the patient again over the next 7 days.   [name], PharmD, [place] [employer] [phone]

## 2019-04-12 NOTE — Chronic Care Management (AMB) (Signed)
Chronic Care Management   Follow Up Note   04/12/2019 Name: Lucas Caldwell MRN: [id number] DOB: 02-16-1934  Referred by: [name], DO Reason for referral : Chronic Care Management (Caregiver Phone Call) and Care Coordination   Lucas Caldwell is a 86 y.o. year old male who is a primary care patient of [name], DO. The CCM team was consulted for assistance with chronic disease management and care coordination needs. Lucas Caldwell has a past medical history including but not limited to type 2 diabetes, CKD, hyperlipidemia, COPD, atrial fibrillation, hypertension, coronary artery disease, Chronic HFpEF, hypothyroidism, and CML  Receive coordination of care call from [place] Simcox.  I reached out to Lucas Caldwell and Lucas Caldwell by phone today. .   Review of patient status, including review of consultants reports, relevant laboratory and other test results, and collaboration with appropriate care team members and the patient's provider was performed as part of comprehensive patient evaluation and provision of chronic care management services.    Outpatient Encounter Medications as of 04/12/2019  Medication Sig Note  . ACCU-CHEK AVIVA PLUS test strip Use to check blood sugar up to 2 times daily   . Accu-Chek Softclix Lancets lancets Use to check blood sugar up to 2 times daily.   [name] acetaminophen (TYLENOL) 500 MG tablet Take 1,000 mg by mouth every 6 (six) hours as needed for moderate pain or headache.    . Alcohol Swabs 70 % PADS Use when checking blood sugar up to 3x daily   . allopurinol (ZYLOPRIM) 100 MG tablet TAKE ONE TABLET BY MOUTH ONCE DAILY (Patient taking differently: Take 100 mg by mouth 2 (two) times daily. )   . Blood Glucose Monitoring Suppl (ACCU-CHEK AVIVA PLUS) w/Device KIT Use to check blood sugar up to 2 times daily   . bosutinib (BOSULIF) 100 MG tablet Take 100 mg by mouth at bedtime as needed (tired/not feeling well). Take with  food.  08/31/2018: Pt's Caldwell states he only takes this as needed when he's "not feeling well"  . Dulaglutide (TRULICITY) 8.50 YD/7.4JO SOPN Inject 0.75 mg into the skin once a week. (Patient taking differently: Inject 0.75 mg into the skin every Saturday. )   . gabapentin (NEURONTIN) 300 MG capsule Take 2 capsules (600 mg total) by mouth 2 (two) times daily. Max dose = 1238m daily for kidney function (Patient taking differently: Take 600 mg by mouth 2 (two) times daily. )   . Insulin Glargine ([name] KWIKPEN) 100 UNIT/ML SOPN Inject 28 Units into the skin daily.    . Insulin Pen Needle 31G X 5 MM MISC 31 g by Does not apply route as directed.   . isosorbide mononitrate (IMDUR) 60 MG 24 hr tablet TAKE 1 AND 1/2 TABLETS BY MOUTH ONCE DAILY (Patient taking differently: Take 90 mg by mouth daily. )   . levothyroxine (SYNTHROID) 25 MCG tablet TAKE 1 TABLET (25 MCG TOTAL) BY MOUTH DAILY BEFORE BREAKFAST.   .Marland Kitchenloperamide (IMODIUM A-D) 2 MG tablet Take 2 mg by mouth 3 (three) times daily as needed for diarrhea or loose stools.    . Menthol-Zinc Oxide (GOLD BOND EX) Apply 1 application topically daily as needed (muscle pain).   . metoprolol succinate (TOPROL-XL) 25 MG 24 hr tablet TAKE ONE TABLET BY MOUTH ONCE DAILY (Patient taking differently: Take 25 mg by mouth daily. )   . Multiple Vitamins-Minerals (MULTIVITAMIN ADULTS 50+ PO) Take 1 tablet by mouth every morning.   .[name]  nitroGLYCERIN (NITROSTAT) 0.4 MG SL tablet Place 1 tablet (0.4 mg total) under the tongue every 5 (five) minutes x 3 doses as needed for chest pain.   [name] omeprazole (PRILOSEC) 20 MG capsule Take 20 mg by mouth 2 (two) times daily.   [name] Place 1 drop into both eyes daily as needed (dry eyes).   . polyethylene glycol (MIRALAX / GLYCOLAX) 17 g packet Take 17 g by mouth daily as needed for mild constipation.    [name] PROVENTIL HFA 108 (90 Base) MCG/ACT inhaler Inhale 2 puffs into the lungs every 6 (six)  hours as needed for wheezing or shortness of breath.   . ranolazine (RANEXA) 500 MG 12 hr tablet TAKE ONE TABLET BY MOUTH TWICE DAILY (Patient taking differently: Take 500 mg by mouth 2 (two) times daily. )   . rosuvastatin (CRESTOR) 5 MG tablet Take 1 tablet (5 mg total) by mouth daily. (Patient taking differently: Take 5 mg by mouth at bedtime. )   . senna (SENOKOT) 8.6 MG tablet Take 1 tablet by mouth daily as needed for constipation.   . torsemide (DEMADEX) 20 MG tablet Take 1 tablet (20 mg total) by mouth 2 (two) times daily.   . traMADol (ULTRAM) 50 MG tablet Take 1 tablet (50 mg total) by mouth every 8 (eight) hours as needed for moderate pain. (Patient not taking: Reported on 04/06/2019)   . warfarin (COUMADIN) 3 MG tablet Take 1 tablet (3 mg total) by mouth as directed. Take as directed by the coumadin clinic. (Patient taking differently: Take 3 mg by mouth See admin instructions. Tale 4.5 mg on Monday and Friday All the other days takes 5 mg)    No facility-administered encounter medications on file as of 04/12/2019.    Goals Addressed            This Visit's Progress   . PharmD - Medication Managment       Current Barriers:  . Financial o Note patient has Limited Partial Extra Help Subsidy . Limited vision . Chronic Disease Management support and education needs related to multiple chronic disease processes  Pharmacist Clinical Goal(s):  [name] Over the next 30 days, patient will work with CM Pharmacist to address needs related to medication adherence and diabetes management  Interventions: . Receive call from [place] Simcox. Reports patient's application for [id number] re-enrollment in [place] patient assistance program for Trulicity and Nancee Liter is still in processing, not yet approved.  o Reports patient has only one dose of Trulicity 2.22 mg remaining, due to be given on 1/30. Does not currently have dose for 2/6. [name] Follow up call to patient/caregiver to also check on patient's  supply of [name]. Speak with Lucas Caldwell who reports patient has 4 boxes of [name] remaining. [name] Collaborate with office regarding availability of samples for patient. Office is currently out of Trulicity 9.79 mg samples.  Kandis Ban, CMA with office, reaching out to [employer] regarding obtaining sample for patient. . Let Lucas Caldwell know that I or the office will follow up once we hear back from [employer].  Patient Self Care Activities:  . Check blood sugar, blood pressure and daily weight as directed by providers  . Takes medications, with assistance of Caldwell, as directed . Patient attends scheduled medical appointments  Please see past updates related to this goal by clicking on the "Past Updates" button in the selected goal         Plan  The care management team will reach out to the patient again over the next 7 days.   [name], PharmD, [place] [employer] [phone]

## 2019-04-12 NOTE — Patient Outreach (Signed)
[place] [facility]) Care Management  04/12/2019  Lucas Caldwell [birth year] [id number]   Incoming call received from patient's wife, Lucas Caldwell in regards to [place] manager for [employer].  Spoke to [place], [place] identifiers verified.  [name] informed that she was inquiring about the status of his application. She informed she called [employer] to place a refill of Trulicity and was told he had to submit a new application for 6922. Informed her that we had already submitted that application but that I would call and check on the status of the application.  She informed patient has 1 pen left which he is to use on 04/16/2018. After that point he will not have any more doses left to give the 2/6 injection.  [employer].

## 2019-04-12 NOTE — Patient Outreach (Addendum)
[place] [facility]) Care Management  04/12/2019  Lucas Caldwell 1933/08/23 [id number]  ADDENDUM  Successful outreach call placed to patient in regards to patient's application for Trulicity and [name] with [employer].  Spoke to Lucas Caldwell, Lucas Caldwell identifiers verified.  Informed Lucas Caldwell that Lucas Caldwell is processing his application but gave no turn around time as to when the application determination would be made.  Informed Lucas Caldwell that I would outreach embedded [employer] RPh [name] to inquire if samples could be provided in the meantime.   Will route note to [place].  Will follow up as previously scheduled.  Montoya Watkin P. Kimball Manske, [place] Management [phone]

## 2019-04-13 ENCOUNTER — Other Ambulatory Visit: Payer: Self-pay

## 2019-04-13 ENCOUNTER — Encounter
Admission: RE | Disposition: A | Discharge status: Home / Self Care | Payer: Medicare HMO | Source: Home / Self Care | Attending: Internal Medicine

## 2019-04-13 ENCOUNTER — Inpatient Hospital Stay
Admission: RE | Admit: 2019-04-13 | Discharge: 2019-04-15 | DRG: 246 | Disposition: A | Discharge status: Home Health | Payer: Medicare HMO | Attending: Internal Medicine | Admitting: Internal Medicine

## 2019-04-13 DIAGNOSIS — N400 Enlarged prostate without lower urinary tract symptoms: Secondary | ICD-10-CM | POA: Diagnosis present

## 2019-04-13 DIAGNOSIS — E1140 Type 2 diabetes mellitus with diabetic neuropathy, unspecified: Secondary | ICD-10-CM | POA: Diagnosis present

## 2019-04-13 DIAGNOSIS — I200 Unstable angina: Secondary | ICD-10-CM | POA: Diagnosis not present

## 2019-04-13 DIAGNOSIS — I5022 Chronic systolic (congestive) heart failure: Secondary | ICD-10-CM | POA: Diagnosis present

## 2019-04-13 DIAGNOSIS — N1831 Chronic kidney disease, stage 3a: Secondary | ICD-10-CM | POA: Diagnosis present

## 2019-04-13 DIAGNOSIS — H5461 Unqualified visual loss, right eye, normal vision left eye: Secondary | ICD-10-CM | POA: Diagnosis present

## 2019-04-13 DIAGNOSIS — E785 Hyperlipidemia, unspecified: Secondary | ICD-10-CM | POA: Diagnosis present

## 2019-04-13 DIAGNOSIS — I5043 Acute on chronic combined systolic (congestive) and diastolic (congestive) heart failure: Secondary | ICD-10-CM | POA: Diagnosis present

## 2019-04-13 DIAGNOSIS — I480 Paroxysmal atrial fibrillation: Secondary | ICD-10-CM | POA: Diagnosis present

## 2019-04-13 DIAGNOSIS — E1121 Type 2 diabetes mellitus with diabetic nephropathy: Secondary | ICD-10-CM | POA: Diagnosis not present

## 2019-04-13 DIAGNOSIS — K219 Gastro-esophageal reflux disease without esophagitis: Secondary | ICD-10-CM | POA: Diagnosis present

## 2019-04-13 DIAGNOSIS — M109 Gout, unspecified: Secondary | ICD-10-CM | POA: Diagnosis present

## 2019-04-13 DIAGNOSIS — Z6835 Body mass index (BMI) 35.0-35.9, adult: Secondary | ICD-10-CM

## 2019-04-13 DIAGNOSIS — Z87892 Personal history of anaphylaxis: Secondary | ICD-10-CM

## 2019-04-13 DIAGNOSIS — I2510 Atherosclerotic heart disease of native coronary artery without angina pectoris: Secondary | ICD-10-CM | POA: Diagnosis present

## 2019-04-13 DIAGNOSIS — I5042 Chronic combined systolic (congestive) and diastolic (congestive) heart failure: Secondary | ICD-10-CM | POA: Diagnosis present

## 2019-04-13 DIAGNOSIS — E669 Obesity, unspecified: Secondary | ICD-10-CM | POA: Diagnosis present

## 2019-04-13 DIAGNOSIS — I25110 Atherosclerotic heart disease of native coronary artery with unstable angina pectoris: Principal | ICD-10-CM

## 2019-04-13 DIAGNOSIS — D509 Iron deficiency anemia, unspecified: Secondary | ICD-10-CM | POA: Diagnosis present

## 2019-04-13 DIAGNOSIS — I130 Hypertensive heart and chronic kidney disease with heart failure and stage 1 through stage 4 chronic kidney disease, or unspecified chronic kidney disease: Secondary | ICD-10-CM | POA: Diagnosis present

## 2019-04-13 DIAGNOSIS — J984 Other disorders of lung: Secondary | ICD-10-CM | POA: Diagnosis present

## 2019-04-13 DIAGNOSIS — I255 Ischemic cardiomyopathy: Secondary | ICD-10-CM | POA: Diagnosis present

## 2019-04-13 DIAGNOSIS — Z20822 Contact with and (suspected) exposure to covid-19: Secondary | ICD-10-CM | POA: Diagnosis present

## 2019-04-13 DIAGNOSIS — Z794 Long term (current) use of insulin: Secondary | ICD-10-CM | POA: Diagnosis not present

## 2019-04-13 DIAGNOSIS — C9210 Chronic myeloid leukemia, BCR/ABL-positive, not having achieved remission: Secondary | ICD-10-CM | POA: Diagnosis present

## 2019-04-13 DIAGNOSIS — E1122 Type 2 diabetes mellitus with diabetic chronic kidney disease: Secondary | ICD-10-CM | POA: Diagnosis present

## 2019-04-13 DIAGNOSIS — E039 Hypothyroidism, unspecified: Secondary | ICD-10-CM | POA: Diagnosis present

## 2019-04-13 DIAGNOSIS — I428 Other cardiomyopathies: Secondary | ICD-10-CM | POA: Diagnosis present

## 2019-04-13 DIAGNOSIS — T82855A Stenosis of coronary artery stent, initial encounter: Secondary | ICD-10-CM | POA: Diagnosis present

## 2019-04-13 DIAGNOSIS — M1990 Unspecified osteoarthritis, unspecified site: Secondary | ICD-10-CM | POA: Diagnosis present

## 2019-04-13 DIAGNOSIS — I25118 Atherosclerotic heart disease of native coronary artery with other forms of angina pectoris: Secondary | ICD-10-CM

## 2019-04-13 DIAGNOSIS — Z888 Allergy status to other drugs, medicaments and biological substances status: Secondary | ICD-10-CM

## 2019-04-13 DIAGNOSIS — Z79890 Hormone replacement therapy (postmenopausal): Secondary | ICD-10-CM

## 2019-04-13 DIAGNOSIS — Z8546 Personal history of malignant neoplasm of prostate: Secondary | ICD-10-CM

## 2019-04-13 DIAGNOSIS — Z7901 Long term (current) use of anticoagulants: Secondary | ICD-10-CM

## 2019-04-13 DIAGNOSIS — Z881 Allergy status to other antibiotic agents status: Secondary | ICD-10-CM

## 2019-04-13 DIAGNOSIS — Z955 Presence of coronary angioplasty implant and graft: Secondary | ICD-10-CM | POA: Insufficient documentation

## 2019-04-13 DIAGNOSIS — I452 Bifascicular block: Secondary | ICD-10-CM | POA: Diagnosis present

## 2019-04-13 DIAGNOSIS — Z8711 Personal history of peptic ulcer disease: Secondary | ICD-10-CM

## 2019-04-13 DIAGNOSIS — Z79899 Other long term (current) drug therapy: Secondary | ICD-10-CM

## 2019-04-13 DIAGNOSIS — N183 Chronic kidney disease, stage 3 unspecified: Secondary | ICD-10-CM | POA: Diagnosis present

## 2019-04-13 DIAGNOSIS — N1832 Chronic kidney disease, stage 3b: Secondary | ICD-10-CM | POA: Diagnosis not present

## 2019-04-13 DIAGNOSIS — I10 Essential (primary) hypertension: Secondary | ICD-10-CM | POA: Diagnosis present

## 2019-04-13 HISTORY — PX: RIGHT/LEFT HEART CATH AND CORONARY ANGIOGRAPHY: CATH118266

## 2019-04-13 HISTORY — PX: CORONARY STENT INTERVENTION: CATH118234

## 2019-04-13 LAB — PROTIME-INR
INR: 1.2 (ref 0.8–1.2)
Prothrombin Time: 14.8 seconds (ref 11.4–15.2)

## 2019-04-13 LAB — GLUCOSE, CAPILLARY
Glucose-Capillary: 183 mg/dL — ABNORMAL HIGH (ref 70–99)
Glucose-Capillary: 187 mg/dL — ABNORMAL HIGH (ref 70–99)
Glucose-Capillary: 181 mg/dL — ABNORMAL HIGH (ref 70–99)
Glucose-Capillary: 171 mg/dL — ABNORMAL HIGH (ref 70–99)

## 2019-04-13 LAB — MRSA PCR SCREENING: MRSA by PCR: NEGATIVE

## 2019-04-13 LAB — HEMOGLOBIN A1C
Hgb A1c MFr Bld: 7.6 % — ABNORMAL HIGH (ref 4.8–5.6)
Mean Plasma Glucose: 171.42 mg/dL

## 2019-04-13 SURGERY — RIGHT/LEFT HEART CATH AND CORONARY ANGIOGRAPHY
Anesthesia: Moderate Sedation

## 2019-04-13 MED ORDER — CHLORHEXIDINE GLUCONATE CLOTH 2 % EX PADS
6.0000 | MEDICATED_PAD | Freq: Every day | CUTANEOUS | Status: DC
  Administered 2019-04-14: 6 via TOPICAL

## 2019-04-13 MED ORDER — VERAPAMIL HCL 2.5 MG/ML IV SOLN
INTRAVENOUS | Status: AC
  Filled 2019-04-13: qty 2

## 2019-04-13 MED ORDER — SODIUM CHLORIDE 0.9% FLUSH
3.0000 mL | Freq: Two times a day (BID) | INTRAVENOUS | Status: DC
  Administered 2019-04-13 – 2019-04-15 (×4): 3 mL via INTRAVENOUS

## 2019-04-13 MED ORDER — SODIUM CHLORIDE 0.9% FLUSH: 3.0000 mL | INTRAVENOUS | Status: DC | PRN

## 2019-04-13 MED ORDER — HYDRALAZINE HCL 25 MG PO TABS
25.0000 mg | ORAL_TABLET | Freq: Three times a day (TID) | ORAL | Status: DC
  Administered 2019-04-13 – 2019-04-15 (×5): 25 mg via ORAL
  Filled 2019-04-13 (×7): qty 1

## 2019-04-13 MED ORDER — ACETAMINOPHEN 325 MG PO TABS: 650.0000 mg | ORAL_TABLET | ORAL | Status: DC | PRN

## 2019-04-13 MED ORDER — SODIUM CHLORIDE 0.9 % IV SOLN: 250.0000 mL | INTRAVENOUS | Status: DC | PRN

## 2019-04-13 MED ORDER — TICAGRELOR 90 MG PO TABS
ORAL_TABLET | ORAL | Status: AC
  Filled 2019-04-13: qty 2

## 2019-04-13 MED ORDER — HEPARIN SODIUM (PORCINE) 5000 UNIT/ML IJ SOLN
5000.0000 [IU] | Freq: Three times a day (TID) | INTRAMUSCULAR | Status: DC
  Administered 2019-04-13 – 2019-04-15 (×5): 5000 [IU] via SUBCUTANEOUS
  Filled 2019-04-13 (×5): qty 1

## 2019-04-13 MED ORDER — MIDAZOLAM HCL 2 MG/2ML IJ SOLN
INTRAMUSCULAR | Status: DC | PRN
  Administered 2019-04-13: 0.5 mg via INTRAVENOUS

## 2019-04-13 MED ORDER — ALBUTEROL SULFATE (2.5 MG/3ML) 0.083% IN NEBU: 2.5000 mg | INHALATION_SOLUTION | Freq: Four times a day (QID) | RESPIRATORY_TRACT | Status: DC | PRN

## 2019-04-13 MED ORDER — HEPARIN SODIUM (PORCINE) 1000 UNIT/ML IJ SOLN
INTRAMUSCULAR | Status: DC | PRN
  Administered 2019-04-13 (×2): 5000 [IU] via INTRAVENOUS

## 2019-04-13 MED ORDER — FUROSEMIDE 10 MG/ML IJ SOLN
INTRAMUSCULAR | Status: AC
  Administered 2019-04-13: 13:00:00 40 mg via INTRAVENOUS
  Filled 2019-04-13: qty 4

## 2019-04-13 MED ORDER — ALLOPURINOL 100 MG PO TABS
100.0000 mg | ORAL_TABLET | Freq: Two times a day (BID) | ORAL | Status: DC
  Administered 2019-04-13 – 2019-04-15 (×4): 100 mg via ORAL
  Filled 2019-04-13 (×5): qty 1

## 2019-04-13 MED ORDER — INSULIN GLARGINE 100 UNIT/ML ~~LOC~~ SOLN
28.0000 [IU] | Freq: Every day | SUBCUTANEOUS | Status: DC
  Administered 2019-04-13 – 2019-04-14 (×2): 28 [IU] via SUBCUTANEOUS
  Filled 2019-04-13 (×3): qty 0.28

## 2019-04-13 MED ORDER — ROSUVASTATIN CALCIUM 5 MG PO TABS
5.0000 mg | ORAL_TABLET | Freq: Every day | ORAL | Status: DC
  Administered 2019-04-13 – 2019-04-14 (×2): 5 mg via ORAL
  Filled 2019-04-13 (×3): qty 1

## 2019-04-13 MED ORDER — FUROSEMIDE 10 MG/ML IJ SOLN: 40.0000 mg | Freq: Once | INTRAMUSCULAR | Status: AC

## 2019-04-13 MED ORDER — NITROGLYCERIN 1 MG/10 ML FOR IR/CATH LAB
INTRA_ARTERIAL | Status: DC | PRN
  Administered 2019-04-13: 200 ug via INTRACORONARY

## 2019-04-13 MED ORDER — HEPARIN (PORCINE) IN NACL 1000-0.9 UT/500ML-% IV SOLN
INTRAVENOUS | Status: AC
  Filled 2019-04-13: qty 1000

## 2019-04-13 MED ORDER — ASPIRIN 81 MG PO CHEW: 81.0000 mg | CHEWABLE_TABLET | ORAL | Status: DC

## 2019-04-13 MED ORDER — IPRATROPIUM-ALBUTEROL 0.5-2.5 (3) MG/3ML IN SOLN
RESPIRATORY_TRACT | Status: AC
  Administered 2019-04-13: 3 mL
  Filled 2019-04-13: qty 3

## 2019-04-13 MED ORDER — METOPROLOL SUCCINATE ER 50 MG PO TB24
25.0000 mg | ORAL_TABLET | Freq: Every day | ORAL | Status: DC
  Administered 2019-04-13 – 2019-04-14 (×2): 25 mg via ORAL
  Filled 2019-04-13 (×2): qty 1

## 2019-04-13 MED ORDER — TICAGRELOR 90 MG PO TABS
90.0000 mg | ORAL_TABLET | Freq: Two times a day (BID) | ORAL | Status: DC
  Administered 2019-04-13 – 2019-04-15 (×4): 90 mg via ORAL
  Filled 2019-04-13 (×4): qty 1

## 2019-04-13 MED ORDER — GABAPENTIN 300 MG PO CAPS
600.0000 mg | ORAL_CAPSULE | Freq: Two times a day (BID) | ORAL | Status: DC
  Administered 2019-04-13 – 2019-04-15 (×4): 600 mg via ORAL
  Filled 2019-04-13 (×4): qty 2

## 2019-04-13 MED ORDER — VERAPAMIL HCL 2.5 MG/ML IV SOLN
INTRAVENOUS | Status: DC | PRN
  Administered 2019-04-13: 2.5 mg via INTRA_ARTERIAL

## 2019-04-13 MED ORDER — NITROGLYCERIN 0.4 MG SL SUBL: 0.4000 mg | SUBLINGUAL_TABLET | SUBLINGUAL | Status: DC | PRN

## 2019-04-13 MED ORDER — SODIUM CHLORIDE 0.9% FLUSH: 3.0000 mL | Freq: Two times a day (BID) | INTRAVENOUS | Status: DC

## 2019-04-13 MED ORDER — HEPARIN SODIUM (PORCINE) 1000 UNIT/ML IJ SOLN
INTRAMUSCULAR | Status: AC
  Filled 2019-04-13: qty 1

## 2019-04-13 MED ORDER — MIDAZOLAM HCL 2 MG/2ML IJ SOLN
INTRAMUSCULAR | Status: AC
  Filled 2019-04-13: qty 2

## 2019-04-13 MED ORDER — HYDRALAZINE HCL 20 MG/ML IJ SOLN
INTRAMUSCULAR | Status: DC | PRN
  Administered 2019-04-13: 10 mg via INTRAVENOUS

## 2019-04-13 MED ORDER — PANTOPRAZOLE SODIUM 40 MG PO TBEC
40.0000 mg | DELAYED_RELEASE_TABLET | Freq: Every day | ORAL | Status: DC
  Administered 2019-04-13 – 2019-04-15 (×3): 40 mg via ORAL
  Filled 2019-04-13 (×3): qty 1

## 2019-04-13 MED ORDER — IOHEXOL 300 MG/ML  SOLN
INTRAMUSCULAR | Status: DC | PRN
  Administered 2019-04-13: 12:00:00 100 mL

## 2019-04-13 MED ORDER — HYDRALAZINE HCL 20 MG/ML IJ SOLN
INTRAMUSCULAR | Status: AC
  Filled 2019-04-13: qty 1

## 2019-04-13 MED ORDER — TICAGRELOR 90 MG PO TABS
180.0000 mg | ORAL_TABLET | Freq: Once | ORAL | Status: AC
  Administered 2019-04-13: 180 mg via ORAL

## 2019-04-13 MED ORDER — FENTANYL CITRATE (PF) 100 MCG/2ML IJ SOLN
INTRAMUSCULAR | Status: AC
  Filled 2019-04-13: qty 2

## 2019-04-13 MED ORDER — INSULIN ASPART 100 UNIT/ML ~~LOC~~ SOLN
0.0000 [IU] | Freq: Three times a day (TID) | SUBCUTANEOUS | Status: DC
  Administered 2019-04-14: 2 [IU] via SUBCUTANEOUS
  Administered 2019-04-14: 1 [IU] via SUBCUTANEOUS
  Administered 2019-04-14 – 2019-04-15 (×2): 2 [IU] via SUBCUTANEOUS
  Administered 2019-04-15: 1 [IU] via SUBCUTANEOUS
  Filled 2019-04-13 (×5): qty 1

## 2019-04-13 MED ORDER — NITROGLYCERIN 1 MG/10 ML FOR IR/CATH LAB
INTRA_ARTERIAL | Status: AC
  Filled 2019-04-13: qty 10

## 2019-04-13 MED ORDER — RANOLAZINE ER 500 MG PO TB12
500.0000 mg | ORAL_TABLET | Freq: Two times a day (BID) | ORAL | Status: DC
  Administered 2019-04-13 – 2019-04-15 (×4): 500 mg via ORAL
  Filled 2019-04-13 (×5): qty 1

## 2019-04-13 MED ORDER — SODIUM CHLORIDE 0.9 % IV SOLN
INTRAVENOUS | Status: DC
  Administered 2019-04-13: 1000 mL via INTRAVENOUS

## 2019-04-13 MED ORDER — ISOSORBIDE MONONITRATE ER 60 MG PO TB24
90.0000 mg | ORAL_TABLET | Freq: Every day | ORAL | Status: DC
  Administered 2019-04-13 – 2019-04-15 (×3): 90 mg via ORAL
  Filled 2019-04-13 (×3): qty 1

## 2019-04-13 MED ORDER — LEVOTHYROXINE SODIUM 25 MCG PO TABS
25.0000 ug | ORAL_TABLET | Freq: Every day | ORAL | Status: DC
  Administered 2019-04-13 – 2019-04-15 (×2): 25 ug via ORAL
  Filled 2019-04-13 (×2): qty 1

## 2019-04-13 MED ORDER — HEPARIN (PORCINE) IN NACL 1000-0.9 UT/500ML-% IV SOLN
INTRAVENOUS | Status: DC | PRN
  Administered 2019-04-13: 500 mL

## 2019-04-13 MED ORDER — HYDRALAZINE HCL 20 MG/ML IJ SOLN: 10.0000 mg | INTRAMUSCULAR | Status: AC | PRN

## 2019-04-13 MED ORDER — FENTANYL CITRATE (PF) 100 MCG/2ML IJ SOLN
INTRAMUSCULAR | Status: DC | PRN
  Administered 2019-04-13: 12.5 ug via INTRAVENOUS

## 2019-04-13 MED ORDER — ASPIRIN 81 MG PO CHEW
81.0000 mg | CHEWABLE_TABLET | Freq: Every day | ORAL | Status: DC
  Administered 2019-04-14 – 2019-04-15 (×2): 81 mg via ORAL
  Filled 2019-04-13 (×2): qty 1

## 2019-04-13 SURGICAL SUPPLY — 18 items
BALLN MINITREK RX 2.0X12 (BALLOONS) ×3
BALLN ~~LOC~~ TREK RX 2.5X12 (BALLOONS) ×3
BALLOON MINITREK RX 2.0X12 (BALLOONS) ×1 IMPLANT
BALLOON ~~LOC~~ TREK RX 2.5X12 (BALLOONS) ×1 IMPLANT
CATH BALLN WEDGE 5F 110CM (CATHETERS) ×3 IMPLANT
CATH INFINITI JR4 5F (CATHETERS) ×3 IMPLANT
CATH LAUNCHER 5F EBU3.5 (CATHETERS) ×3 IMPLANT
DEVICE INFLAT 30 PLUS (MISCELLANEOUS) ×3 IMPLANT
DEVICE RAD TR BAND REGULAR (VASCULAR PRODUCTS) ×3 IMPLANT
GLIDESHEATH SLEND SS 6F .021 (SHEATH) ×3 IMPLANT
GUIDEWIRE .025 260CM (WIRE) ×3 IMPLANT
KIT MANI 3VAL PERCEP (MISCELLANEOUS) ×3 IMPLANT
PACK CARDIAC CATH (CUSTOM PROCEDURE TRAY) ×3 IMPLANT
SHEATH GLIDE SLENDER 4/5FR (SHEATH) ×6 IMPLANT
STENT RESOLUTE ONYX 2.5X15 (Permanent Stent) ×3 IMPLANT
WIRE HITORQ VERSACORE ST 145CM (WIRE) ×3 IMPLANT
WIRE ROSEN-J .035X260CM (WIRE) ×3 IMPLANT
WIRE RUNTHROUGH .014X180CM (WIRE) ×3 IMPLANT

## 2019-04-13 NOTE — Progress Notes (Signed)
Patient noted to be in some respiratory distress post procedure. Although SPO2 reading within normal limits, patient had labored breathing. Lungs were clear with some light wheezing noted. MD made aware. Orders received and medications given. Foley catheter inserted due to diuretics given. 700 output immediately after insertion. Duoneb administered. Will continue to monitor.

## 2019-04-13 NOTE — Brief Op Note (Signed)
04/13/2019  12:56 PM  PATIENT:  Lucas Caldwell  86 y.o. male  PRE-OPERATIVE DIAGNOSIS:  Accelerating angina and heart failure  POST-OPERATIVE DIAGNOSIS:  Same  PROCEDURE:  Procedure(s): RIGHT/LEFT HEART CATH AND CORONARY ANGIOGRAPHY (N/A) CORONARY STENT INTERVENTION (N/A)  SURGEON:  Surgeon(s) and Role:    * Navon Kotowski, MD - Primary  FINDINGS: 1. Multivessel CAD, similar to prior catheterization in 08/2018. 2. Moderately elevated left heart filling pressure. 3. Mildly elevated right heart filling pressure. 4. Successful PCI to mid LAD using Resolute Onyx 2.5 x 15 mm drug eluting stent with <10% residual stenosis and TIMI-3 flow.  RECOMMENDATIONS: 1. DAPT with ASA and ticagrelor for 3 months, at which time we will consider restarting warfarin and dropping ticagrelor. 2. Diuresis. 3. Optimize heart failure therapy.  [name], MD [facility]

## 2019-04-13 NOTE — Interval H&P Note (Signed)
History and Physical Interval Note:  04/13/2019 9:18 AM  Lucas Caldwell  has presented today for surgery, with the diagnosis of accelerating angina and chronic HFrEF.  The various methods of treatment have been discussed with the patient and family. After consideration of risks, benefits and other options for treatment, the patient has consented to  Procedure(s): RIGHT/LEFT HEART CATH AND CORONARY ANGIOGRAPHY (N/A) as a surgical intervention.  The patient's history has been reviewed, patient examined, no change in status, stable for surgery.  I have reviewed the patient's chart and labs.  Questions were answered to the patient's satisfaction.    Cath Lab Visit (complete for each Cath Lab visit)  Clinical Evaluation Leading to the Procedure:   ACS: No.  Non-ACS:    Anginal Classification: CCS IV  Anti-ischemic medical therapy: Maximal Therapy (2 or more classes of medications)  Non-Invasive Test Results: N/A; moderately reduced LVEF and abnormal iFR of proximal/mid LAD in 08/2018.  Prior CABG: No previous CABG  Lucas Caldwell

## 2019-04-14 ENCOUNTER — Encounter: Payer: Self-pay | Admitting: Cardiology

## 2019-04-14 ENCOUNTER — Other Ambulatory Visit: Payer: Self-pay

## 2019-04-14 DIAGNOSIS — I25118 Atherosclerotic heart disease of native coronary artery with other forms of angina pectoris: Secondary | ICD-10-CM

## 2019-04-14 DIAGNOSIS — I5022 Chronic systolic (congestive) heart failure: Secondary | ICD-10-CM

## 2019-04-14 DIAGNOSIS — I480 Paroxysmal atrial fibrillation: Secondary | ICD-10-CM

## 2019-04-14 LAB — BASIC METABOLIC PANEL WITH GFR
Anion gap: 11 (ref 5–15)
BUN: 18 mg/dL (ref 8–23)
CO2: 23 mmol/L (ref 22–32)
Calcium: 9.2 mg/dL (ref 8.9–10.3)
Chloride: 103 mmol/L (ref 98–111)
Creatinine, Ser: 1.25 mg/dL — ABNORMAL HIGH (ref 0.61–1.24)
GFR calc Af Amer: >60 mL/min
GFR calc non Af Amer: 53 mL/min — ABNORMAL LOW
Glucose, Bld: 141 mg/dL — ABNORMAL HIGH (ref 70–99)
Potassium: 3.5 mmol/L (ref 3.5–5.1)
Sodium: 137 mmol/L (ref 135–145)

## 2019-04-14 LAB — CBC
HCT: 37.1 % — ABNORMAL LOW (ref 39.0–52.0)
Hemoglobin: 12.3 g/dL — ABNORMAL LOW (ref 13.0–17.0)
MCH: 28.2 pg (ref 26.0–34.0)
MCHC: 33.2 g/dL (ref 30.0–36.0)
MCV: 85.1 fL (ref 80.0–100.0)
Platelets: 181 10*3/uL (ref 150–400)
RBC: 4.36 MIL/uL (ref 4.22–5.81)
RDW: 14.6 % (ref 11.5–15.5)
WBC: 9.6 10*3/uL (ref 4.0–10.5)
nRBC: 0 % (ref 0.0–0.2)

## 2019-04-14 LAB — GLUCOSE, CAPILLARY
Glucose-Capillary: 122 mg/dL — ABNORMAL HIGH (ref 70–99)
Glucose-Capillary: 208 mg/dL — ABNORMAL HIGH (ref 70–99)
Glucose-Capillary: 160 mg/dL — ABNORMAL HIGH (ref 70–99)
Glucose-Capillary: 169 mg/dL — ABNORMAL HIGH (ref 70–99)

## 2019-04-14 LAB — POCT ACTIVATED CLOTTING TIME: Activated Clotting Time: 301 seconds

## 2019-04-14 MED ORDER — METOPROLOL SUCCINATE ER 50 MG PO TB24
25.0000 mg | ORAL_TABLET | Freq: Once | ORAL | Status: AC
  Administered 2019-04-14: 25 mg via ORAL
  Filled 2019-04-14: qty 1

## 2019-04-14 MED ORDER — METOPROLOL SUCCINATE ER 50 MG PO TB24
50.0000 mg | ORAL_TABLET | Freq: Every day | ORAL | Status: DC
  Administered 2019-04-15: 50 mg via ORAL
  Filled 2019-04-14: qty 1

## 2019-04-14 MED ORDER — POTASSIUM CHLORIDE CRYS ER 20 MEQ PO TBCR
40.0000 meq | EXTENDED_RELEASE_TABLET | Freq: Once | ORAL | Status: AC
  Administered 2019-04-14: 40 meq via ORAL
  Filled 2019-04-14: qty 2

## 2019-04-14 MED ORDER — FUROSEMIDE 10 MG/ML IJ SOLN
40.0000 mg | Freq: Every day | INTRAMUSCULAR | Status: DC
  Administered 2019-04-14: 08:00:00 40 mg via INTRAVENOUS
  Filled 2019-04-14: qty 4

## 2019-04-14 MED ORDER — FUROSEMIDE 10 MG/ML IJ SOLN
40.0000 mg | Freq: Two times a day (BID) | INTRAMUSCULAR | Status: DC
  Administered 2019-04-14 – 2019-04-15 (×2): 40 mg via INTRAVENOUS
  Filled 2019-04-14 (×2): qty 4

## 2019-04-14 NOTE — Progress Notes (Signed)
Progress Note  Patient Name: Lucas Caldwell Date of Encounter: 04/14/2019  Primary Cardiologist: [name], MD   Subjective   The patient denies chest pain but still states having shortness of breath.  Inpatient Medications    Scheduled Meds: . allopurinol  100 mg Oral BID  . aspirin  81 mg Oral Daily  . Chlorhexidine Gluconate Cloth  6 each Topical Daily  . furosemide  40 mg Intravenous Daily  . gabapentin  600 mg Oral BID  . heparin  5,000 Units Subcutaneous Q8H  . hydrALAZINE  25 mg Oral Q8H  . insulin aspart  0-9 Units Subcutaneous TID WC  . insulin glargine  28 Units Subcutaneous Q2200  . isosorbide mononitrate  90 mg Oral Daily  . levothyroxine  25 mcg Oral QAC breakfast  . metoprolol succinate  25 mg Oral Daily  . pantoprazole  40 mg Oral Daily  . ranolazine  500 mg Oral BID  . rosuvastatin  5 mg Oral QHS  . sodium chloride flush  3 mL Intravenous Q12H  . ticagrelor  90 mg Oral BID   Continuous Infusions: . sodium chloride     PRN Meds: sodium chloride, acetaminophen, albuterol, nitroGLYCERIN, sodium chloride flush   Vital Signs    Vitals:   04/14/19 0400 04/14/19 0500 04/14/19 0600 04/14/19 0811  BP: 113/75 117/69 118/70 121/75  Pulse: 81 84 82 98  Resp: 14 (!) 21 14   Temp:      TempSrc:      SpO2: 98% 99% 95%   Weight:      Height:        Intake/Output Summary (Last 24 hours) at 04/14/2019 0927 Last data filed at 04/14/2019 0600 Gross per 24 hour  Intake 243 ml  Output 2900 ml  Net -2657 ml   Last 3 Weights 04/13/2019 04/13/2019 04/02/2019  Weight (lbs) 204 lb 9.4 oz 206 lb 208 lb 2 oz  Weight (kg) 92.8 kg 93.441 kg 94.405 kg      Telemetry    Sinus rhythm, heart rate 97- Personally Reviewed  ECG    Not obtained today- Personally Reviewed  Physical Exam   GEN: No acute distress.   Neck: JVD difficult to assess Cardiac: RRR, no murmurs, rubs, or gallops.  Respiratory:  Poor inspiratory effort, clear anteriorly, decreased  breath sounds at bases GI: Soft, nontender, non-distended  MS: trace edema; No deformity. Neuro:  Nonfocal  Psych: Normal affect   Labs    High Sensitivity Troponin:  No results for input(s): TROPONINIHS in the last 720 hours.    Chemistry Recent Labs  Lab 04/14/19 0435  NA 137  K 3.5  CL 103  CO2 23  GLUCOSE 141*  BUN 18  CREATININE 1.25*  CALCIUM 9.2  GFRNONAA 53*  GFRAA >60  ANIONGAP 11     Hematology Recent Labs  Lab 04/14/19 0435  WBC 9.6  RBC 4.36  HGB 12.3*  HCT 37.1*  MCV 85.1  MCH 28.2  MCHC 33.2  RDW 14.6  PLT 181    BNPNo results for input(s): BNP, PROBNP in the last 168 hours.   DDimer No results for input(s): DDIMER in the last 168 hours.   Radiology    CARDIAC CATHETERIZATION  Result Date: 04/14/2019 Conclusions: 1. Multivessel coronary artery, similar to prior catheterization in 08/2018.  There is diffuse disease involving the ostial through mid LAD, up to 70%.  Prior iFR and 08/2018 showed sequential LAD lesions were hemodynamically significant. 2. Moderately elevated  left heart filling pressure and pulmonary artery pressure. 3. Mildly elevated right heart filling pressure. 4. Mildly reduced Fick cardiac output/index. 5. Successful PCI to mid LAD using Resolute Onyx 2.5 x 15 mm drug eluting stent with <10% residual stenosis and TIMI-3 flow.  Recommendations: 1. Dual antiplatelet therapy with aspirin and ticagrelor for 3 months, at which time we will consider restarting warfarin and dropping ticagrelor. 2. Left heart pressures are significantly higher than prior catheterization in 08/2018.  Patient needs further diuresis and optimization of evidence-based heart failure therapy, including afterload reduction. [name], MD [facility]    Cardiac Studies   [facility] 04/13/2019 Conclusions: 1. Multivessel coronary artery, similar to prior catheterization in 08/2018.  There is diffuse disease involving the ostial through mid LAD, up to 70%.  Prior  iFR and 08/2018 showed sequential LAD lesions were hemodynamically significant. 2. Moderately elevated left heart filling pressure and pulmonary artery pressure. 3. Mildly elevated right heart filling pressure. 4. Mildly reduced Fick cardiac output/index. 5. Successful PCI to mid LAD using Resolute Onyx 2.5 x 15 mm drug eluting stent with <10% residual stenosis and TIMI-3 flow.  Recommendations: 1. Dual antiplatelet therapy with aspirin and ticagrelor for 3 months, at which time we will consider restarting warfarin and dropping ticagrelor. 2. Left heart pressures are significantly higher than prior catheterization in 08/2018.  Patient needs further diuresis and optimization of evidence-based heart failure therapy, including afterload reduction.  TTE 04/14/2019 1. Severe hypokinesis of the left ventricular, entire inferior wall.  2. The left ventricle has moderately reduced systolic function, with an ejection fraction of 35-40%. The cavity size was normal. There is mildly increased left ventricular wall thickness. Left ventricular diastolic Doppler parameters are indeterminate.  Left ventricular diffuse hypokinesis.  3. The tricuspid valve is grossly normal.  4. The aortic valve is tricuspid.  5. Mildly dilated pulmonary artery.  6. The interatrial septum was not well visualized.  Patient Profile     86 y.o. male with history of HFrEF EF 35-40%, hypertension, hyperlipidemia, diabetes, paroxysmal atrial fibrillation, CKD stage III presenting for left heart cath due to angina.  Assessment & Plan    Left heart cath showed multivessel disease with severe mid LAD stenosis status post PCI with a drug-eluting stent to the LAD.  Patient still complains of shortness of breath.  It is possible his symptoms are secondary to heart failure.  Trace edema noted on exam and he is net -2.6 L over the past 24 hours.  Plan to continue diuresing with IV Lasix.  Monitor creatinine.  1. CAD s/p PCI to LAD -DAPT  with asa and brillinta x 3 months (plan to restart warfarin when brillinta is stopped) -Toprol xl, imdur, ranexa, crestor as prescribed -currently chestpain free, but is sob. -PT to see if patient is symptomatic with ambulation -patient can be downgraded/moved to telemetry unit.  2. HFrEF EF 35-40% -Net -2.6 L.  Measure weights daily.  Strict ins and outs. -HR 97bpm -increaseToprol-XL to 50mg  qd, cont hydralazine 25mg   3 times daily. imdur -Continue Lasix 40 mg daily IV.  Creatinine improved from yesterday.  Continue to monitor Cr  3. PAF -currently in sinus -holding warfarin, since starting brillinta -plan to restart warfarin after 3 months of DAPT. Will stop brillinta at that time    Signed, Kate Sable, MD  04/14/2019, 9:27 AM

## 2019-04-14 NOTE — Progress Notes (Signed)
[place] visited pt.'s room while rounding on ICU; pt. in chair w/wife (Rose) sitting on bed.  Pt. shared he underwent cardiac cath procedure; received two stints.  Shared he is feeling better; 'just need to make sure I take it easy' he shared; said doctors told him he has a 'weak heart'.  Pt. and wife shared they have been married 81 yrs; in brief life review, pt. shared 'The [place]ita Cliche has been good to [place]'; has one dtr., one son, several grandchildren and great grandchildren who are source of support.  Pt. requested prayer for health, wellbeing of family; Pt. anticipates discharge later today; [place] remains available as needed.      04/14/19 1400  Clinical Encounter Type  Visited With Patient and family together  Visit Type Initial;Critical Care;Spiritual support;Psychological support;Social support  Referral From  (RT)  Spiritual Encounters  Spiritual Needs Prayer;Emotional  Stress Factors  Patient Stress Factors Health changes;Major life changes  Family Stress Factors Major life changes

## 2019-04-14 NOTE — Evaluation (Signed)
Physical Therapy Evaluation Patient Details Name: Lucas Caldwell MRN: [id number] DOB: 1934/01/20 Today's Date: 04/14/2019   History of Present Illness  Lucas Caldwell is an 5yoM who comes to [facility] for coronary stent placement 1/26. PMH:DM2, CKD, hyperlipidemia, COPD, atrial fibrillation, hypertension, coronary artery disease, Chronic HFpEF, hypothyroidism, and CML  Clinical Impression  Pt admitted with above diagnosis. Pt currently with functional limitations due to the deficits listed below (see "PT Problem List"). Upon entry, pt in chair, awake and agreeable to participate. Wife in room. The pt is alert and oriented x4, pleasant, conversational, and generally a good historian. Pt educated on RUE NWB restrictions x 48hrs, reminders needed during transfers and SPC use during AMB. Pt a bit unsteady with transfers and gait, not accustomed to eschewing RUE use, but unsteadiness of gait improves with practice of SPC in LUE. Functional mobility assessment demonstrates increased effort/time requirements, poor tolerance, and need for physical assistance, whereas the patient performed these at a higher level of independence PTA. Pt will benefit from skilled PT intervention to increase independence and safety with basic mobility in preparation for discharge to the venue listed below.       Follow Up Recommendations Home health PT;Supervision for mobility/OOB    Equipment Recommendations  None recommended by PT    Recommendations for Other Services       Precautions / Restrictions Precautions Precautions: Fall Precaution Comments: RUE post radial cath restriction x48hrs Restrictions Weight Bearing Restrictions: Yes RUE Weight Bearing: Non weight bearing      Mobility  Bed Mobility               General bed mobility comments: in chair upon entry  Transfers Overall transfer level: Needs assistance Equipment used: Straight cane Transfers: Sit to/from Stand Sit to Stand: Min  guard         General transfer comment: unsteady, heavy effort, needs reminder of RUE NWB; issued his SPC for LUE use  Ambulation/Gait Ambulation/Gait assistance: Min guard;Supervision Gait Distance (Feet): 315 Feet Assistive device: Straight cane Gait Pattern/deviations: WFL(Within Functional Limits)     General Gait Details: 3-point gait SPC in LUE; cued reminders for RUE NWB; mildly unsteady at start but improves with distance  Stairs            Wheelchair Mobility    Modified Rankin (Stroke Patients Only)       Balance Overall balance assessment: Modified Independent;Mild deficits observed, not formally tested;History of Falls                                           Pertinent Vitals/Pain Pain Assessment: No/denies pain    Home Living Family/patient expects to be discharged to:: Private residence Living Arrangements: Alone Available Help at Discharge: Family Type of Home: House Home Access: Stairs to enter Entrance Stairs-Rails: Right;Left;Can reach both Entrance Stairs-Number of Steps: 3 Home Layout: One level Home Equipment: Cane - single point;Walker - 2 wheels;Bedside commode      Prior Function Level of Independence: Independent with assistive device(s)         Comments: No falls in 6 months.     Hand Dominance   Dominant Hand: Right    Extremity/Trunk Assessment   Upper Extremity Assessment Upper Extremity Assessment: Generalized weakness;Overall [facility] for tasks assessed    Lower Extremity Assessment Lower Extremity Assessment: Generalized weakness;Overall [facility] for tasks assessed  Cervical / Trunk Assessment Cervical / Trunk Assessment: Normal  Communication   Communication: No difficulties  Cognition Arousal/Alertness: Awake/alert Behavior During Therapy: WFL for tasks assessed/performed Overall Cognitive Status: Within Functional Limits for tasks assessed                                         General Comments      Exercises     Assessment/Plan    PT Assessment Patient needs continued PT services  PT Problem List Decreased strength;Decreased balance;Decreased activity tolerance       PT Treatment Interventions DME instruction;Gait training;Stair training;Functional mobility training;Therapeutic activities;Therapeutic exercise;Balance training;Patient/family education    PT Goals (Current goals can be found in the Care Plan section)  Acute Rehab PT Goals Patient Stated Goal: return to home soon PT Goal Formulation: With patient Time For Goal Achievement: 04/28/19 Potential to Achieve Goals: Good    Frequency Min 2X/week   Barriers to discharge        Co-evaluation               AM-PAC PT "6 Clicks" Mobility  Outcome Measure Help needed turning from your back to your side while in a flat bed without using bedrails?: A Little Help needed moving from lying on your back to sitting on the side of a flat bed without using bedrails?: A Little Help needed moving to and from a bed to a chair (including a wheelchair)?: A Little Help needed standing up from a chair using your arms (e.g., wheelchair or bedside chair)?: A Little Help needed to walk in hospital room?: A Little Help needed climbing 3-5 steps with a railing? : A Little 6 Click Score: 18    End of Session Equipment Utilized During Treatment: Gait belt Activity Tolerance: Patient tolerated treatment well;No increased pain;Patient limited by fatigue Patient left: in chair;with family/visitor present Nurse Communication: Mobility status PT Visit Diagnosis: Unsteadiness on feet (R26.81);Difficulty in walking, not elsewhere classified (R26.2)    Time: 6773-7366 PT Time Calculation (min) (ACUTE ONLY): 18 min   Charges:   PT Evaluation $PT Eval Low Complexity: 1 Low PT Treatments $Gait Training: 8-22 mins       2:12 PM, 04/14/19 Etta Grandchild, PT, DPT Physical Therapist - [facility]  [phone] ([place])    Makhayla Mcmurry C 04/14/2019, 2:10 PM

## 2019-04-15 ENCOUNTER — Other Ambulatory Visit: Payer: Self-pay | Admitting: Nurse Practitioner

## 2019-04-15 ENCOUNTER — Telehealth: Payer: Self-pay | Admitting: Internal Medicine

## 2019-04-15 DIAGNOSIS — I200 Unstable angina: Secondary | ICD-10-CM

## 2019-04-15 DIAGNOSIS — E1121 Type 2 diabetes mellitus with diabetic nephropathy: Secondary | ICD-10-CM

## 2019-04-15 DIAGNOSIS — N1832 Chronic kidney disease, stage 3b: Secondary | ICD-10-CM

## 2019-04-15 DIAGNOSIS — I5043 Acute on chronic combined systolic (congestive) and diastolic (congestive) heart failure: Secondary | ICD-10-CM

## 2019-04-15 DIAGNOSIS — N183 Chronic kidney disease, stage 3 unspecified: Secondary | ICD-10-CM

## 2019-04-15 DIAGNOSIS — I255 Ischemic cardiomyopathy: Secondary | ICD-10-CM

## 2019-04-15 DIAGNOSIS — Z794 Long term (current) use of insulin: Secondary | ICD-10-CM

## 2019-04-15 LAB — BASIC METABOLIC PANEL
Anion gap: 11 (ref 5–15)
BUN: 23 mg/dL (ref 8–23)
CO2: 22 mmol/L (ref 22–32)
Calcium: 9.3 mg/dL (ref 8.9–10.3)
Chloride: 103 mmol/L (ref 98–111)
Creatinine, Ser: 1.45 mg/dL — ABNORMAL HIGH (ref 0.61–1.24)
GFR calc Af Amer: 51 mL/min — ABNORMAL LOW (ref >60)
GFR calc non Af Amer: 44 mL/min — ABNORMAL LOW (ref >60)
Glucose, Bld: 179 mg/dL — ABNORMAL HIGH (ref 70–99)
Potassium: 4.1 mmol/L (ref 3.5–5.1)
Sodium: 136 mmol/L (ref 135–145)

## 2019-04-15 LAB — GLUCOSE, CAPILLARY
Glucose-Capillary: 122 mg/dL — ABNORMAL HIGH (ref 70–99)
Glucose-Capillary: 187 mg/dL — ABNORMAL HIGH (ref 70–99)

## 2019-04-15 MED ORDER — TORSEMIDE 20 MG PO TABS: ORAL_TABLET | ORAL | 3 refills | Status: DC

## 2019-04-15 MED ORDER — HYDRALAZINE HCL 25 MG PO TABS: 25.0000 mg | ORAL_TABLET | Freq: Three times a day (TID) | ORAL | 6 refills | Status: DC

## 2019-04-15 MED ORDER — ASPIRIN 81 MG PO CHEW: 81.0000 mg | CHEWABLE_TABLET | Freq: Every day | ORAL | 6 refills | Status: DC

## 2019-04-15 MED ORDER — TICAGRELOR 90 MG PO TABS: 90.0000 mg | ORAL_TABLET | Freq: Two times a day (BID) | ORAL | 6 refills | Status: DC

## 2019-04-15 MED ORDER — METOPROLOL SUCCINATE ER 50 MG PO TB24: 50.0000 mg | ORAL_TABLET | Freq: Every day | ORAL | 6 refills | Status: DC

## 2019-04-15 NOTE — Discharge Instructions (Signed)
**  PLEASE REMEMBER TO BRING ALL OF YOUR MEDICATIONS TO EACH OF YOUR FOLLOW-UP OFFICE VISITS. ° °NO HEAVY LIFTING OR SEXUAL ACTIVITY X 7 DAYS. °NO DRIVING X 3-5 DAYS. °NO SOAKING BATHS, HOT TUBS, POOLS, ETC., X 7 DAYS. ° °Radial Site Care °Refer to this sheet in the next few weeks. These instructions provide you with information on caring for yourself after your procedure. Your caregiver may also give you more specific instructions. Your treatment has been planned according to current medical practices, but problems sometimes occur. Call your caregiver if you have any problems or questions after your procedure. °HOME CARE INSTRUCTIONS °· You may shower the day after the procedure. Remove the bandage (dressing) and gently wash the site with plain soap and water. Gently pat the site dry.  °· Do not apply powder or lotion to the site.  °· Do not submerge the affected site in water for 3 to 5 days.  °· Inspect the site at least twice daily.  °· Do not flex or bend the affected arm for 24 hours.  °· No lifting over 5 pounds (2.3 kg) for 5 days after your procedure.  °· Do not drive home if you are discharged the same day of the procedure. Have someone else drive you.  ° °What to expect: °· Any bruising will usually fade within 1 to 2 weeks.  °· Blood that collects in the tissue (hematoma) may be painful to the touch. It should usually decrease in size and tenderness within 1 to 2 weeks.  °SEEK IMMEDIATE MEDICAL CARE IF: °· You have unusual pain at the radial site.  °· You have redness, warmth, swelling, or pain at the radial site.  °· You have drainage (other than a small amount of blood on the dressing).  °· You have chills.  °· You have a fever or persistent symptoms for more than 72 hours.  °· You have a fever and your symptoms suddenly get worse.  °· Your arm becomes pale, cool, tingly, or numb.  °· You have heavy bleeding from the site. Hold pressure on the site.  ° ° °

## 2019-04-15 NOTE — Telephone Encounter (Signed)
Patient spouse calling   Has questions regarding Brilinta medication  Please call to discuss

## 2019-04-15 NOTE — Progress Notes (Signed)
Discharge instructions explained to pt and pts wife, [name], verbalized an understanding/ iv and tele removed/ transported off unit via wheelchair.

## 2019-04-15 NOTE — Discharge Summary (Signed)
Discharge Summary    Patient ID: ASIR BINGLEY MRN: [id number]; DOB: 11/08/1933  Admit date: 04/13/2019 Discharge date: 04/15/2019  Primary Care Provider: [name], DO  Primary Cardiologist: [name], MD  Primary Electrophysiologist:  None   Discharge Diagnoses    Principal Problem:   Accelerating angina [facility])  **s/p PCI/DES to the LAD this admission. Active Problems:   Acute on chronic combined systolic and diastolic CHF (congestive heart failure) (HCC)   Essential hypertension   Stage 3 chronic kidney disease   Controlled type 2 diabetes mellitus with diabetic nephropathy (HCC)   Ischemic cardiomyopathy   PAF (paroxysmal atrial fibrillation) (HCC)   GERD (gastroesophageal reflux disease)   Diabetic neuropathy associated with type 2 diabetes mellitus [facility])  Diagnostic Studies/Procedures    Cardiac Catheterization and Percutaneous Coronary Intervention 1.26.2021  Left Main  Vessel is large.  Ost LM lesion 60% stenosed  Ost LM lesion is 60% stenosed.  Left Anterior Descending  Vessel is large.  Ost LAD lesion 20% stenosed  Ost LAD lesion is 20% stenosed.  Prox LAD lesion 40% stenosed  Prox LAD lesion is 40% stenosed. The lesion is eccentric. The lesion is calcified.  Mid LAD-1 lesion 60% stenosed  Mid LAD-1 lesion is 60% stenosed. The lesion is moderately calcified.  Mid LAD-2 lesion 70% stenosed  Mid LAD-2 lesion is 70% stenosed. The lesion is eccentric.      **The LAD was successfully treated with a 2.5 x 15 mm resolute Onyx drug-eluting stent**  First Diagonal Branch  Vessel is moderate in size.  1st Diag lesion 70% stenosed  1st Diag lesion is 70% stenosed.  Second Diagonal Branch  Vessel is small in size.  Third Diagonal Branch  Vessel is small in size.  Left Circumflex  Vessel is large.  Mid Cx to Dist Cx lesion 0% stenosed  Non-stenotic Mid Cx to Dist Cx lesion was previously treated.  First Obtuse Marginal Branch  Vessel is  large in size.  1st Mrg lesion 30% stenosed  1st Mrg lesion is 30% stenosed.  Second Obtuse Marginal Branch  Vessel is small in size.  Right Coronary Artery  Vessel is moderate in size.  Prox RCA to Mid RCA lesion 0% stenosed  Non-stenotic Prox RCA to Mid RCA lesion was previously treated.  Mid RCA lesion 40% stenosed  Mid RCA lesion is 40% stenosed. The lesion was previously treated using a stent (unknown type) over 2 years ago.  Dist RCA lesion 40% stenosed  Dist RCA lesion is 40% stenosed.  Right Posterior Descending Artery  Vessel is moderate in size.  RPDA lesion 30% stenosed  RPDA lesion is 30% stenosed.  Right Posterior Atrioventricular Artery  Vessel is small in size.   Right Heart Pressures RA (mean): 10 mmHg RV (S/EDP): 53/12 mmHg PA (S/D, mean): 53/30 (40) mmHg PCWP (mean): 28 mmHg  Ao sat: 100% PA sat: 67%  Fick CO: 4.2 L/min Fick CI: 2.1 L/min/m^2   _____________   History of Present Illness     Lucas Caldwell is a 86 y.o. male with a history of coronary artery disease, HFrEF, mixed ischemic and nonischemic cardiomyopathy, hypertension, hyperlipidemia, type 2 diabetes mellitus, paroxysmal atrial fibrillation on chronic warfarin, stage III chronic kidney disease, CML, iron deficiency anemia, GI bleed due to duodenal ulcers, BPH, and hypothyroidism.  He previously underwent PCI and stenting of the right coronary artery in June 2018 and in the setting of persistent dyspnea on exertion complicated by chronic kidney disease, he  has required frequent outpatient adjustments to his diuretics.  In June 2020, he underwent repeat diagnostic catheterization revealing a 70% left main stenosis with an abnormal iFR of 0.86, though it was felt that the drop in iFR was secondary to proximal LAD disease with only a small gradient involving the ostial left main.  Nitrate dosing was increased however, he continued to experience dyspnea on exertion despite further titration of  outpatient diuretics.  As a result, when he was seen on January 15, arrangements were made for diagnostic catheterization.  Hospital Course     Consultants: None  Patient presented to the cardiac catheterization laboratory at [facility] on April 13, 2019 and underwent diagnostic catheterization revealing 60% ostial left main stenosis with 60/70% mid LAD disease, and nonobstructive circumflex, and RCA disease.  The LAD was successfully treated with a 2.5 x 15 mm resolute Onyx drug-eluting stent.  Of note, right heart catheterization was also performed and notable for an elevated pulmonary capillary wedge pressure of 28 mmHg.  In that setting, following PCI, patient was admitted for diuresis.  With diuresis over the past 2 days, [name] has noted some improvement in dyspnea on exertion.  His metoprolol dose has been titrated to 50 mg daily while hydralazine therapy was added for additional afterload reduction.  In the setting of PCI with prior history of GI bleed and requirement for dual antiplatelet therapy, warfarin is currently on hold.  We will consider resumption of warfarin and discontinuation of Brilinta in approximately 3 months.  Though intakes have been inaccurate, he has put out 3.8 L since admission and his weight is down 1.3 kg to 91.5 kg, which is the lowest he has been since September 2020.  Renal function has been relatively stable in the setting of diuresis with a creatinine of 1.45 this morning.  He ambulated with physical therapy this morning and noted an improvement in activity tolerance.  He was able to ambulate 500 feet with some dyspnea occurring after walking about 350 feet.  Overall, [name] reported feeling 50 to 75% better.  He has not had any chest pain.  [name] will be discharged home this afternoon in good condition.  We will arrange for a basic metabolic panel within the next week and he will follow-up with Dr. Saunders Revel as previously scheduled on  February 10.  Did the patient have an acute coronary syndrome (MI, NSTEMI, STEMI, etc) this admission?:  No                               Did the patient have a percutaneous coronary intervention (stent / angioplasty)?:  Yes.     Cath/PCI Registry Performance & Quality Measures: 1. Aspirin prescribed? - Yes 2. ADP Receptor Inhibitor (Plavix/Clopidogrel, Brilinta/Ticagrelor or Effient/Prasugrel) prescribed (includes medically managed patients)? - Yes 3. High Intensity Statin (Lipitor 40-48m or Crestor 20-458m prescribed? - Yes 4. For EF <40%, was ACEI/ARB prescribed? - No - Reason:  CKD III 5. For EF <40%, Aldosterone Antagonist (Spironolactone or Eplerenone) prescribed? - No - Reason:  CKD III 6. Cardiac Rehab Phase II ordered (Included Medically managed Patients)? - Yes   _____________  Discharge Vitals Blood pressure (!) 114/58, pulse (!) 108, temperature 98.4 F (36.9 C), temperature source Oral, resp. rate 16, height '5\' 4"'  (1.626 m), weight 91.5 kg, SpO2 100 %.  Filed Weights   04/13/19 0721 04/13/19 1847 04/15/19 0501  Weight: 93.4 kg 92.8  kg 91.5 kg    Labs & Radiologic Studies    CBC Recent Labs    04/14/19 0435  WBC 9.6  HGB 12.3*  HCT 37.1*  MCV 85.1  PLT 629   Basic Metabolic Panel Recent Labs    04/14/19 0435 04/15/19 0937  NA 137 136  K 3.5 4.1  CL 103 103  CO2 23 22  GLUCOSE 141* 179*  BUN 18 23  CREATININE 1.25* 1.45*  CALCIUM 9.2 9.3   Hemoglobin A1C Recent Labs    04/13/19 1919  HGBA1C 7.6*   _____________   Disposition   Pt is being discharged home today in good condition.  Follow-up Plans & Appointments    Follow-up Information    [facility] Follow up.   Specialty: Cardiac Rehabilitation Why: Your Cardiologist has referred you to outpatient Cardiac Rehab at [facility].  The Cardiac Rehab department will contact you within one to two weeks after discharge to schedule your first appointment.   Contact  information: [place] [phone] [address] [place] [phone]       [name], MD Follow up on 04/27/2020.   Specialty: Cardiology Why: 4:20 PM Contact information: [place] [address] [phone]        [facility] Follow up on 04/22/2019.   Why: please present to the [facility] next week for a blood chemistry (re-evaluate your kidney function). Contact information: [place] [phone]         Discharge Instructions    (HEART FAILURE PATIENTS) Call MD:  Anytime you have any of the following symptoms: 1) 3 pound weight gain in 24 hours or 5 pounds in 1 week 2) shortness of breath, with or without a dry hacking cough 3) swelling in the hands, feet or stomach 4) if you have to sleep on extra pillows at night in order to breathe.   Complete by: As directed    AMB Referral to Cardiac Rehabilitation - Phase II   Complete by: As directed    Diagnosis: Coronary Stents   After initial evaluation and assessments completed: Virtual Based Care may be provided alone or in conjunction with Phase 2 Cardiac Rehab based on patient barriers.: Yes   Call MD for:  difficulty breathing, headache or visual disturbances   Complete by: As directed    Call MD for:  severe uncontrolled pain   Complete by: As directed    Diet - low sodium heart healthy   Complete by: As directed    Diet Carb Modified   Complete by: As directed    Heart Failure patients record your daily weight using the same scale at the same time of day   Complete by: As directed    STOP any activity that causes chest pain, shortness of breath, dizziness, sweating, or exessive weakness   Complete by: As directed       Discharge Medications   Allergies as of 04/15/2019      Reactions   Clopidogrel Anaphylaxis   altered mental status;  (electrolytes "out of wack" and  confusion per family; THEY DO NOT REMEMBER ANY OTHER REACTION)- MSB 10/10/15   Ciprofloxacin Itching   Mirtazapine Diarrhea   Bad dreams   Spironolactone    gynecomastia      Medication List    STOP taking these medications   warfarin 3 MG tablet Commonly known as: COUMADIN  TAKE these medications   Accu-Chek Aviva Plus test strip Generic drug: glucose blood Use to check blood sugar up to 2 times daily   Accu-Chek Aviva Plus w/Device Kit Use to check blood sugar up to 2 times daily   Accu-Chek Softclix Lancets lancets Use to check blood sugar up to 2 times daily.   acetaminophen 500 MG tablet Commonly known as: TYLENOL Take 1,000 mg by mouth every 6 (six) hours as needed for moderate pain or headache.   Alcohol Swabs 70 % Pads Use when checking blood sugar up to 3x daily   allopurinol 100 MG tablet Commonly known as: ZYLOPRIM TAKE ONE TABLET BY MOUTH ONCE DAILY What changed: when to take this   aspirin 81 MG chewable tablet Chew 1 tablet (81 mg total) by mouth daily. Start taking on: April 16, 2019   [name] KwikPen 100 UNIT/ML Sopn Inject 28 Units into the skin daily.   bosutinib 100 MG tablet Commonly known as: BOSULIF Take 100 mg by mouth at bedtime as needed (tired/not feeling well). Take with food.   Dulaglutide 0.75 MG/0.5ML Sopn Commonly known as: Trulicity Inject 2.99 mg into the skin once a week. What changed: when to take this   gabapentin 300 MG capsule Commonly known as: NEURONTIN Take 2 capsules (600 mg total) by mouth 2 (two) times daily. Max dose = 1219m daily for kidney function What changed: additional instructions   GOLD BOND EX Apply 1 application topically daily as needed (muscle pain).   hydrALAZINE 25 MG tablet Commonly known as: APRESOLINE Take 1 tablet (25 mg total) by mouth 3 (three) times daily.   Insulin Pen Needle 31G X 5 MM Misc 31 g by Does not apply route as directed.   isosorbide mononitrate 60 MG 24 hr  tablet Commonly known as: IMDUR TAKE 1 AND 1/2 TABLETS BY MOUTH ONCE DAILY   levothyroxine 25 MCG tablet Commonly known as: SYNTHROID TAKE 1 TABLET (25 MCG TOTAL) BY MOUTH DAILY BEFORE BREAKFAST.   loperamide 2 MG tablet Commonly known as: IMODIUM A-D Take 2 mg by mouth 3 (three) times daily as needed for diarrhea or loose stools.   metoprolol succinate 50 MG 24 hr tablet Commonly known as: TOPROL-XL Take 1 tablet (50 mg total) by mouth daily. What changed:   medication strength  how much to take   MULTIVITAMIN ADULTS 50+ PO Take 1 tablet by mouth every morning.   nitroGLYCERIN 0.4 MG SL tablet Commonly known as: NITROSTAT Place 1 tablet (0.4 mg total) under the tongue every 5 (five) minutes x 3 doses as needed for chest pain.   omeprazole 20 MG capsule Commonly known as: PRILOSEC Take 20 mg by mouth 2 (two) times daily.   polyethylene glycol 17 g packet Commonly known as: MIRALAX / GLYCOLAX Take 17 g by mouth daily as needed for mild constipation.   Proventil HFA 108 (90 Base) MCG/ACT inhaler Generic drug: albuterol Inhale 2 puffs into the lungs every 6 (six) hours as needed for wheezing or shortness of breath.   ranolazine 500 MG 12 hr tablet Commonly known as: RANEXA TAKE ONE TABLET BY MOUTH TWICE DAILY   rosuvastatin 5 MG tablet Commonly known as: CRESTOR Take 1 tablet (5 mg total) by mouth daily. What changed: when to take this   senna 8.6 MG tablet Commonly known as: SENOKOT Take 1 tablet by mouth daily as needed for constipation.   SYSTANE OP Place 1 drop into both eyes daily as needed (dry eyes).  ticagrelor 90 MG Tabs tablet Commonly known as: BRILINTA Take 1 tablet (90 mg total) by mouth 2 (two) times daily.   torsemide 20 MG tablet Commonly known as: DEMADEX 2 tabs every morning; 1 tab every PM What changed:   how much to take  how to take this  when to take this  additional instructions   traMADol 50 MG tablet Commonly known as:  ULTRAM Take 1 tablet (50 mg total) by mouth every 8 (eight) hours as needed for moderate pain.         Outstanding Labs/Studies   BMET in 1 wk  Duration of Discharge Encounter   Greater than 30 minutes including physician time.  Signed, [name], NP 04/15/2019, 10:59 AM

## 2019-04-15 NOTE — Progress Notes (Addendum)
Progress Note  Patient Name: Lucas Caldwell Date of Encounter: 04/15/2019  Primary Cardiologist: [name], MD  Subjective   Overall, feeling better compared to prior to admission.  Says that if he gets in a hurry, he still notes some dyspnea.  He hasn't walked yet this AM.  Eager to go home if possible.  Inpatient Medications    Scheduled Meds: . allopurinol  100 mg Oral BID  . aspirin  81 mg Oral Daily  . Chlorhexidine Gluconate Cloth  6 each Topical Daily  . furosemide  40 mg Intravenous BID  . gabapentin  600 mg Oral BID  . heparin  5,000 Units Subcutaneous Q8H  . hydrALAZINE  25 mg Oral Q8H  . insulin aspart  0-9 Units Subcutaneous TID WC  . insulin glargine  28 Units Subcutaneous Q2200  . isosorbide mononitrate  90 mg Oral Daily  . levothyroxine  25 mcg Oral QAC breakfast  . metoprolol succinate  50 mg Oral Daily  . pantoprazole  40 mg Oral Daily  . ranolazine  500 mg Oral BID  . rosuvastatin  5 mg Oral QHS  . sodium chloride flush  3 mL Intravenous Q12H  . ticagrelor  90 mg Oral BID   Continuous Infusions: . sodium chloride     PRN Meds: sodium chloride, acetaminophen, albuterol, nitroGLYCERIN, sodium chloride flush   Vital Signs    Vitals:   04/14/19 1800 04/14/19 2001 04/15/19 0501 04/15/19 0739  BP: 105/82 128/82 113/66 (!) 114/58  Pulse: (!) 102 80 80 87  Resp: 16   16  Temp:  98.4 F (36.9 C) 98.3 F (36.8 C) 98.4 F (36.9 C)  TempSrc:  Oral Oral Oral  SpO2: 96% 100% 100% 100%  Weight:   91.5 kg   Height:        Intake/Output Summary (Last 24 hours) at 04/15/2019 0823 Last data filed at 04/15/2019 0716 Gross per 24 hour  Intake --  Output 900 ml  Net -900 ml   Filed Weights   04/13/19 0721 04/13/19 1847 04/15/19 0501  Weight: 93.4 kg 92.8 kg 91.5 kg    Physical Exam   GEN: Well nourished, well developed, in no acute distress.  HEENT: Grossly normal.  Neck: Supple, no JVD, carotid bruits, or masses. Cardiac: RRR, no murmurs,  rubs, or gallops. No clubbing, cyanosis, edema.  Radials/DP/PT 2+ and equal bilaterally. R radial cath site w/o bleeding/bruit/hematoma. Respiratory:  Respirations regular and unlabored, clear to auscultation bilaterally. GI: Soft, nontender, nondistended, BS + x 4. MS: no deformity or atrophy. Skin: warm and dry, no rash. Neuro:  Strength and sensation are intact. Psych: AAOx3.  Normal affect.  Labs    Chemistry Recent Labs  Lab 04/14/19 0435  NA 137  K 3.5  CL 103  CO2 23  GLUCOSE 141*  BUN 18  CREATININE 1.25*  CALCIUM 9.2  GFRNONAA 53*  GFRAA >60  ANIONGAP 11     Hematology Recent Labs  Lab 04/14/19 0435  WBC 9.6  RBC 4.36  HGB 12.3*  HCT 37.1*  MCV 85.1  MCH 28.2  MCHC 33.2  RDW 14.6  PLT 181    Telemetry    RSR, 80's - Personally Reviewed  Cardiac Studies   2D Echocardiogram 6.26.2020  1. Severe hypokinesis of the left ventricular, entire inferior wall.  2. The left ventricle has moderately reduced systolic function, with an ejection fraction of 35-40%. The cavity size was normal. There is mildly increased left ventricular wall thickness.  Left ventricular diastolic Doppler parameters are indeterminate.  Left ventricular diffuse hypokinesis.  3. The tricuspid valve is grossly normal.  4. The aortic valve is tricuspid.  5. Mildly dilated pulmonary artery.  6. The interatrial septum was not well visualized. _____________   Cardiac Catheterization and Percutaneous Coronary Intervention 1.26.2021  Left Main  Vessel is large.  Ost LM lesion 60% stenosed  Ost LM lesion is 60% stenosed.  Left Anterior Descending  Vessel is large.  Ost LAD lesion 20% stenosed  Ost LAD lesion is 20% stenosed.  Prox LAD lesion 40% stenosed  Prox LAD lesion is 40% stenosed. The lesion is eccentric. The lesion is calcified.  Mid LAD-1 lesion 60% stenosed  Mid LAD-1 lesion is 60% stenosed. The lesion is moderately calcified.  Mid LAD-2 lesion 70% stenosed  Mid LAD-2  lesion is 70% stenosed. The lesion is eccentric. **The LAD was successfully treated using a 2.5 x 15 Resolute Onyx DES**  First Diagonal Branch  Vessel is moderate in size.  1st Diag lesion 70% stenosed  1st Diag lesion is 70% stenosed.  Second Diagonal Branch  Vessel is small in size.  Third Diagonal Branch  Vessel is small in size.  Left Circumflex  Vessel is large.  Mid Cx to Dist Cx lesion 0% stenosed  Non-stenotic Mid Cx to Dist Cx lesion was previously treated.  First Obtuse Marginal Branch  Vessel is large in size.  1st Mrg lesion 30% stenosed  1st Mrg lesion is 30% stenosed.  Second Obtuse Marginal Branch  Vessel is small in size.  Right Coronary Artery  Vessel is moderate in size.  Prox RCA to Mid RCA lesion 0% stenosed  Non-stenotic Prox RCA to Mid RCA lesion was previously treated.  Mid RCA lesion 40% stenosed  Mid RCA lesion is 40% stenosed. The lesion was previously treated using a stent (unknown type) over 2 years ago.  Dist RCA lesion 40% stenosed  Dist RCA lesion is 40% stenosed.  Right Posterior Descending Artery  Vessel is moderate in size.  RPDA lesion 30% stenosed  RPDA lesion is 30% stenosed.  Right Posterior Atrioventricular Artery  Vessel is small in size.   Right Heart Pressures RA (mean): 10 mmHg RV (S/EDP): 53/12 mmHg PA (S/D, mean): 53/30 (40) mmHg PCWP (mean): 28 mmHg  Ao sat: 100% PA sat: 67%  Fick CO: 4.2 L/min Fick CI: 2.1 L/min/m^2     Patient Profile     86 y.o. male with a hx of CAD, HFrEF, mixed ischemic and nonischemic cardiomyopathy, HTN, HLD, DM2, PAF, CKDIII, CML, iron deficiency anemia, GI bleed due to duodenal ulcers, BPH, and hypothyroidism, who was admitted following LAD stenting on 1/26.  Assessment & Plan    1.  CAD: s/p cath and PCI/DES to the LAD on 1/26.  No chest pain.  Ambulate this AM. Cont asa, brilinta,  blocker, statin, nitrate, and ranexa.  As prev noted, warfarin on hold in setting of DAPT w/ prior h/o GIB.   Will consider resumption of warfarin in 3 mos w/ d/c of brilinta @ that time.  2.  Acute on chronic combined syst/dias CHF/Mixed ICM/NICM: PCWP of 28 @ time of cath 1/26.  Feeling better w/ diuresis, though still notes some dyspnea "if [he] rushes."  Euvolemic on exam.  Intakes inaccuarte.  Minus 3.8L output since admission.  Wt down 1.3 kg - 91.5 kg - lowest since 11/2018.  BMET pending this AM.  Will transition back to torsemide.  He was on 20  bid previously, but w/ significant volume excess on admission, it appears that he will require more.  Will change to 40 in the AM and 20 in the PM.  Cont  blocker, hydral, nitrate.  No acei/arb/arni/MRA in setting of CKD III.  Ambulate this AM - if stable, plan dc.  3.  CKD III:  Creat stable yesterday - pending this AM.  4.  Essential HTN: Stable.  5.  HL: LDL 51 w/ nl lft's in June 2020.  Cont statin.   6.  Anemia/GIB:  H/h stable this admission.  Coumadin on hold jn setting of DAPT. Cont PPI.  7.  PAF:  Maintaining sinus.  Cont  blocker. As above, warfarin on hold in setting of DAPT. Will consider resumption in 3 mos.  8.  DMII:  Cont insulin.  Signed, [name], NP  04/15/2019, 8:23 AM    For questions or updates, please contact   Please consult [email or web address] for contact info under Cardiology/STEMI.

## 2019-04-15 NOTE — Progress Notes (Signed)
Physical Therapy Treatment Patient Details Name: Lucas Caldwell MRN: [id number] DOB: March 08, 1934 Today's Date: 04/15/2019    History of Present Illness Lucas Caldwell is an 25yoM who comes to [facility] for coronary stent placement 1/26. PMH:DM2, CKD, hyperlipidemia, COPD, atrial fibrillation, hypertension, coronary artery disease, Chronic HFpEF, hypothyroidism, and CML    PT Comments    Pt in bed upon entry, agreeable to participate. Pt finishing semirecumbent urination into urinal, but unfortunately, gown and bed are saturated as pt must have spilled part of contents into bed. Pt assisted to bed then with gown change. Pt cleared for RUE use for [facility] this date per cardiology. Pt AMB 566ft with SPC, DOE/SOB improved by ~50-75% this date, but not at baseline. SOB did not appears to interfere with performance until >317ft. Balance is improved during AMB this date as pt is permitted to use BUE ad lib and return to his baseline motor patterns for UE assist. Pt progressing nicely toward all goals in general.    Follow Up Recommendations  Home health PT;Supervision for mobility/OOB     Equipment Recommendations  None recommended by PT    Recommendations for Other Services       Precautions / Restrictions Precautions Precautions: Fall Precaution Comments: RUE post radial cath restriction x48hrs Restrictions Weight Bearing Restrictions: No RUE Weight Bearing: Weight bearing as tolerated Other Position/Activity Restrictions: no lifting, but ok for SPC use    Mobility  Bed Mobility Overal bed mobility: Needs Assistance Bed Mobility: Supine to Sit     Supine to sit: Supervision     General bed mobility comments: some heavy effort, but no assist needed  Transfers Overall transfer level: Needs assistance Equipment used: Straight cane;1 person hand held assist Transfers: Sit to/from Stand Sit to Stand: Min guard         General transfer comment: heavy effort, mild  instability, takes his time.  Ambulation/Gait   Gait Distance (Feet): 510 Feet Assistive device: Straight cane Gait Pattern/deviations: WFL(Within Functional Limits) Gait velocity: 0.61m/s Gait velocity interpretation: >2.62 ft/sec, indicative of community ambulatory General Gait Details: 2-point gait with SPC in RUE sequenced with LLE   Stairs             Wheelchair Mobility    Modified Rankin (Stroke Patients Only)       Balance Overall balance assessment: Modified Independent;Mild deficits observed, not formally tested;History of Falls                                          Cognition Arousal/Alertness: Awake/alert Behavior During Therapy: WFL for tasks assessed/performed Overall Cognitive Status: Within Functional Limits for tasks assessed                                        Exercises      General Comments        Pertinent Vitals/Pain Pain Assessment: No/denies pain    Home Living                      Prior Function            PT Goals (current goals can now be found in the care plan section) Acute Rehab PT Goals Patient Stated Goal: return to home soon PT Goal Formulation: With patient Time  For Goal Achievement: 04/28/19 Potential to Achieve Goals: Good Progress towards PT goals: Progressing toward goals    Frequency    Min 2X/week      PT Plan Current plan remains appropriate    Co-evaluation              AM-PAC PT "6 Clicks" Mobility   Outcome Measure  Help needed turning from your back to your side while in a flat bed without using bedrails?: None Help needed moving from lying on your back to sitting on the side of a flat bed without using bedrails?: None Help needed moving to and from a bed to a chair (including a wheelchair)?: A Little Help needed standing up from a chair using your arms (e.g., wheelchair or bedside chair)?: A Little Help needed to walk in hospital room?: A  Little Help needed climbing 3-5 steps with a railing? : A Little 6 Click Score: 20    End of Session Equipment Utilized During Treatment: Gait belt Activity Tolerance: Patient tolerated treatment well;No increased pain Patient left: in chair;with chair alarm set;with call bell/phone within reach;with nursing/sitter in room Nurse Communication: Mobility status PT Visit Diagnosis: Unsteadiness on feet (R26.81);Difficulty in walking, not elsewhere classified (R26.2)     Time: 6010-9323 PT Time Calculation (min) (ACUTE ONLY): 17 min  Charges:  $Therapeutic Exercise: 8-22 mins                     10:53 AM, 04/15/19 Etta Grandchild, PT, DPT Physical Therapist - [facility]  [phone] ([place])    Taelor Waymire C 04/15/2019, 10:50 AM

## 2019-04-15 NOTE — TOC Transition Note (Signed)
Transition of Care [facility]) - CM/SW Discharge Note   Patient Details  Name: Lucas Caldwell MRN: [id number] Date of Birth: 12-07-1933  Transition of [facility] CM/SW Contact:  Victorino Dike, RN Phone Number: 04/15/2019, 1:54 PM   Clinical Narrative:     Met with patient and provided Brillianta coupon for him to use with first prescription order.  Patient to discharge home today.  No DME recommendations.  [facility] to [facility] PT after discharge. No further TOC needs at this time, please re-consult for new needs.   Final next level of care: [place] Barriers to Discharge: Barriers Resolved   Patient Goals and CMS Choice        Discharge Placement                       Discharge Plan and Services                          HH Arranged: PT [facility] Agency: [facility] Date [place]: 04/15/19 Time [place]: 1222 Representative spoke with at [place]: [place]  Social Determinants of Health ([place]) Interventions     Readmission Risk Interventions No flowsheet data found.

## 2019-04-16 ENCOUNTER — Other Ambulatory Visit: Payer: Self-pay

## 2019-04-16 ENCOUNTER — Telehealth: Payer: Medicare HMO

## 2019-04-16 ENCOUNTER — Ambulatory Visit: Payer: Self-pay | Admitting: Pharmacist

## 2019-04-16 ENCOUNTER — Ambulatory Visit: Payer: Self-pay | Admitting: General Practice

## 2019-04-16 DIAGNOSIS — I130 Hypertensive heart and chronic kidney disease with heart failure and stage 1 through stage 4 chronic kidney disease, or unspecified chronic kidney disease: Secondary | ICD-10-CM | POA: Diagnosis not present

## 2019-04-16 DIAGNOSIS — E1122 Type 2 diabetes mellitus with diabetic chronic kidney disease: Secondary | ICD-10-CM | POA: Diagnosis not present

## 2019-04-16 DIAGNOSIS — I5032 Chronic diastolic (congestive) heart failure: Secondary | ICD-10-CM | POA: Diagnosis not present

## 2019-04-16 DIAGNOSIS — E1121 Type 2 diabetes mellitus with diabetic nephropathy: Secondary | ICD-10-CM | POA: Diagnosis not present

## 2019-04-16 DIAGNOSIS — I5042 Chronic combined systolic (congestive) and diastolic (congestive) heart failure: Secondary | ICD-10-CM | POA: Diagnosis not present

## 2019-04-16 DIAGNOSIS — I5041 Acute combined systolic (congestive) and diastolic (congestive) heart failure: Secondary | ICD-10-CM

## 2019-04-16 DIAGNOSIS — I2510 Atherosclerotic heart disease of native coronary artery without angina pectoris: Secondary | ICD-10-CM | POA: Diagnosis not present

## 2019-04-16 DIAGNOSIS — N184 Chronic kidney disease, stage 4 (severe): Secondary | ICD-10-CM | POA: Diagnosis not present

## 2019-04-16 DIAGNOSIS — Z794 Long term (current) use of insulin: Secondary | ICD-10-CM | POA: Diagnosis not present

## 2019-04-16 DIAGNOSIS — E1140 Type 2 diabetes mellitus with diabetic neuropathy, unspecified: Secondary | ICD-10-CM | POA: Diagnosis not present

## 2019-04-16 DIAGNOSIS — I480 Paroxysmal atrial fibrillation: Secondary | ICD-10-CM | POA: Diagnosis not present

## 2019-04-16 DIAGNOSIS — I255 Ischemic cardiomyopathy: Secondary | ICD-10-CM | POA: Diagnosis not present

## 2019-04-16 DIAGNOSIS — J432 Centrilobular emphysema: Secondary | ICD-10-CM | POA: Diagnosis not present

## 2019-04-16 NOTE — Chronic Care Management (AMB) (Signed)
°  Chronic Care Management   Outreach Note  04/16/2019 Name: Lucas Caldwell MRN: [id number] DOB: [birth year]  Referred by: [name], DO Reason for referral : Chronic Care Management (Post discharge from hospital on 04-15-2019)   An unsuccessful telephone outreach was attempted today. The patient was referred to the case management team by for assistance with care management and care coordination. The patient was discharged from the hospital on 04-15-2019.  Follow Up Plan: A HIPPA compliant phone message was left for the patient providing contact information and requesting a return call. Will attempt to completed post discharge call on 04-19-2019 at 330 pm.  Noreene Larsson RN, MSN, [place] Mobile: [phone]

## 2019-04-16 NOTE — Telephone Encounter (Signed)
Called and s/w wife. States she is going to pick up Brilinta at the drug store today and already has the 30-day free card. She was wondering if we had samples. Advised her to get the 30 day prescription and see if she can find out how much it will be for them. Once we know, then we can determine if we need to pursue patient assistance.   Medication Samples have been left at front desk to pick up at their convenience.   Drug name: Brilinta       Strength: 90 mg        Qty: 2 bottles  LOT: [id number]  Exp.Date: 06/2021

## 2019-04-16 NOTE — Chronic Care Management (AMB) (Signed)
Chronic Care Management   Follow Up Note   04/16/2019 Name: MEAGAN SPEASE MRN: [id number] DOB: February 03, 1934  Referred by: [name], DO Reason for referral : Chronic Care Management (Patient Phone Call)   SOU NOHR is a 86 y.o. year old male who is a primary care patient of [name], DO. The CCM team was consulted for assistance with chronic disease management and care coordination needs. Mr. Esau has a past medical history including but not limited to type 2 diabetes, CKD, hyperlipidemia, COPD, atrial fibrillation, hypertension, coronary artery disease, CHF, hypothyroidism, and CML  Note patient admitted to [facility] 1/26 - 1/28, patient presented for diagnostic cardiac catheterization, PCI/DES to LAD performed.  Was unable to reach patient via telephone today and have left HIPAA compliant voicemail asking patient to return my call.  Review of patient status, including review of consultants reports, relevant laboratory and other test results, and collaboration with appropriate care team members and the patient's provider was performed as part of comprehensive patient evaluation and provision of chronic care management services.     Outpatient Encounter Medications as of 04/16/2019  Medication Sig Note  . ACCU-CHEK AVIVA PLUS test strip Use to check blood sugar up to 2 times daily   . Accu-Chek Softclix Lancets lancets Use to check blood sugar up to 2 times daily.   [name] acetaminophen (TYLENOL) 500 MG tablet Take 1,000 mg by mouth every 6 (six) hours as needed for moderate pain or headache.    . Alcohol Swabs 70 % PADS Use when checking blood sugar up to 3x daily   . allopurinol (ZYLOPRIM) 100 MG tablet TAKE ONE TABLET BY MOUTH ONCE DAILY (Patient taking differently: Take 100 mg by mouth 2 (two) times daily. )   . aspirin 81 MG chewable tablet Chew 1 tablet (81 mg total) by mouth daily.   . Blood Glucose Monitoring Suppl  (ACCU-CHEK AVIVA PLUS) w/Device KIT Use to check blood sugar up to 2 times daily   . bosutinib (BOSULIF) 100 MG tablet Take 100 mg by mouth at bedtime as needed (tired/not feeling well). Take with food.  08/31/2018: Pt's wife states he only takes this as needed when he's "not feeling well"  . Dulaglutide (TRULICITY) 0.81 KG/8.1EH SOPN Inject 0.75 mg into the skin once a week. (Patient taking differently: Inject 0.75 mg into the skin every Saturday. )   . gabapentin (NEURONTIN) 300 MG capsule Take 2 capsules (600 mg total) by mouth 2 (two) times daily. Max dose = 1248m daily for kidney function (Patient taking differently: Take 600 mg by mouth 2 (two) times daily. )   . hydrALAZINE (APRESOLINE) 25 MG tablet Take 1 tablet (25 mg total) by mouth 3 (three) times daily.   . Insulin Glargine ([name] KWIKPEN) 100 UNIT/ML SOPN Inject 28 Units into the skin daily.    . Insulin Pen Needle 31G X 5 MM MISC 31 g by Does not apply route as directed.   . isosorbide mononitrate (IMDUR) 60 MG 24 hr tablet TAKE 1 AND 1/2 TABLETS BY MOUTH ONCE DAILY (Patient taking differently: Take 90 mg by mouth daily. )   . levothyroxine (SYNTHROID) 25 MCG tablet TAKE 1 TABLET (25 MCG TOTAL) BY MOUTH DAILY BEFORE BREAKFAST.   .Marland Kitchenloperamide (IMODIUM A-D) 2 MG tablet Take 2 mg by mouth 3 (three) times daily as needed for diarrhea or loose stools.    . Menthol-Zinc Oxide (GOLD BOND EX) Apply 1 application topically daily as  needed (muscle pain).   . metoprolol succinate (TOPROL-XL) 50 MG 24 hr tablet Take 1 tablet (50 mg total) by mouth daily.   . Multiple Vitamins-Minerals (MULTIVITAMIN ADULTS 50+ PO) Take 1 tablet by mouth every morning.   . nitroGLYCERIN (NITROSTAT) 0.4 MG SL tablet Place 1 tablet (0.4 mg total) under the tongue every 5 (five) minutes x 3 doses as needed for chest pain.   [name] omeprazole (PRILOSEC) 20 MG capsule Take 20 mg by mouth 2 (two) times daily.   [name] Place 1 drop into  both eyes daily as needed (dry eyes).   . polyethylene glycol (MIRALAX / GLYCOLAX) 17 g packet Take 17 g by mouth daily as needed for mild constipation.    [name] PROVENTIL HFA 108 (90 Base) MCG/ACT inhaler Inhale 2 puffs into the lungs every 6 (six) hours as needed for wheezing or shortness of breath.   . ranolazine (RANEXA) 500 MG 12 hr tablet TAKE ONE TABLET BY MOUTH TWICE DAILY (Patient taking differently: Take 500 mg by mouth 2 (two) times daily. )   . rosuvastatin (CRESTOR) 5 MG tablet Take 1 tablet (5 mg total) by mouth daily. (Patient taking differently: Take 5 mg by mouth at bedtime. )   . senna (SENOKOT) 8.6 MG tablet Take 1 tablet by mouth daily as needed for constipation.   . ticagrelor (BRILINTA) 90 MG TABS tablet Take 1 tablet (90 mg total) by mouth 2 (two) times daily.   [name] torsemide (DEMADEX) 20 MG tablet 2 tabs every morning; 1 tab every PM   . traMADol (ULTRAM) 50 MG tablet Take 1 tablet (50 mg total) by mouth every 8 (eight) hours as needed for moderate pain. (Patient not taking: Reported on 04/06/2019)    No facility-administered encounter medications on file as of 04/16/2019.    Goals Addressed            This Visit's Progress   . PharmD - Medication Managment       Current Barriers:  . Financial o Note patient has Limited Partial Extra Help Subsidy . Limited vision . Chronic Disease Management support and education needs related to multiple chronic disease processes  Pharmacist Clinical Goal(s):  [name] Over the next 30 days, patient will work with CM Pharmacist to address needs related to medication adherence and diabetes management  Interventions: . Collaborated with office regarding Trulicity 9.67 mg samples. CMA Asa Lente reports patient's wife to pick up 1 box (2 pens) of Trulicity this afternoon. . Perform chart review. Patient hospitalized at [facility] 1/26 - 1/28, patient presented for diagnostic cardiac catheterization, PCI/DES to LAD performed. . Unable to reach patient by  phone today.  Patient Self Care Activities:  . Check blood sugar, blood pressure and daily weight as directed by providers  . Takes medications, with assistance of wife, as directed . Patient attends scheduled medical appointments  Please see past updates related to this goal by clicking on the "Past Updates" button in the selected goal         Plan  The care management team will reach out to the patient again over the next 7 days.   [name], PharmD, [place] [employer] [phone]

## 2019-04-19 ENCOUNTER — Telehealth: Payer: Self-pay | Admitting: Internal Medicine

## 2019-04-19 ENCOUNTER — Emergency Department: Payer: Medicare HMO

## 2019-04-19 ENCOUNTER — Emergency Department
Admission: EM | Admit: 2019-04-19 | Discharge: 2019-04-19 | Disposition: A | Discharge status: Home / Self Care | Payer: Medicare HMO | Attending: Emergency Medicine | Admitting: Emergency Medicine

## 2019-04-19 ENCOUNTER — Ambulatory Visit (INDEPENDENT_AMBULATORY_CARE_PROVIDER_SITE_OTHER): Payer: Medicare HMO | Admitting: General Practice

## 2019-04-19 ENCOUNTER — Other Ambulatory Visit: Payer: Self-pay

## 2019-04-19 ENCOUNTER — Other Ambulatory Visit: Payer: Self-pay | Admitting: Pharmacy Technician

## 2019-04-19 ENCOUNTER — Encounter: Payer: Self-pay | Admitting: Emergency Medicine

## 2019-04-19 ENCOUNTER — Telehealth: Payer: Medicare HMO | Admitting: General Practice

## 2019-04-19 DIAGNOSIS — R0602 Shortness of breath: Secondary | ICD-10-CM

## 2019-04-19 DIAGNOSIS — R0600 Dyspnea, unspecified: Secondary | ICD-10-CM | POA: Diagnosis not present

## 2019-04-19 DIAGNOSIS — N183 Chronic kidney disease, stage 3 unspecified: Secondary | ICD-10-CM | POA: Diagnosis not present

## 2019-04-19 DIAGNOSIS — E039 Hypothyroidism, unspecified: Secondary | ICD-10-CM | POA: Diagnosis not present

## 2019-04-19 DIAGNOSIS — I130 Hypertensive heart and chronic kidney disease with heart failure and stage 1 through stage 4 chronic kidney disease, or unspecified chronic kidney disease: Secondary | ICD-10-CM | POA: Diagnosis not present

## 2019-04-19 DIAGNOSIS — Z79899 Other long term (current) drug therapy: Secondary | ICD-10-CM | POA: Insufficient documentation

## 2019-04-19 DIAGNOSIS — I5042 Chronic combined systolic (congestive) and diastolic (congestive) heart failure: Secondary | ICD-10-CM | POA: Insufficient documentation

## 2019-04-19 DIAGNOSIS — Z794 Long term (current) use of insulin: Secondary | ICD-10-CM | POA: Diagnosis not present

## 2019-04-19 DIAGNOSIS — I5032 Chronic diastolic (congestive) heart failure: Secondary | ICD-10-CM

## 2019-04-19 DIAGNOSIS — R42 Dizziness and giddiness: Secondary | ICD-10-CM

## 2019-04-19 DIAGNOSIS — Z955 Presence of coronary angioplasty implant and graft: Secondary | ICD-10-CM | POA: Diagnosis not present

## 2019-04-19 DIAGNOSIS — I2510 Atherosclerotic heart disease of native coronary artery without angina pectoris: Secondary | ICD-10-CM | POA: Diagnosis not present

## 2019-04-19 DIAGNOSIS — E119 Type 2 diabetes mellitus without complications: Secondary | ICD-10-CM | POA: Diagnosis not present

## 2019-04-19 DIAGNOSIS — Z87891 Personal history of nicotine dependence: Secondary | ICD-10-CM | POA: Insufficient documentation

## 2019-04-19 DIAGNOSIS — R0609 Other forms of dyspnea: Secondary | ICD-10-CM | POA: Diagnosis not present

## 2019-04-19 DIAGNOSIS — Z7982 Long term (current) use of aspirin: Secondary | ICD-10-CM | POA: Diagnosis not present

## 2019-04-19 LAB — BASIC METABOLIC PANEL
Anion gap: 14 (ref 5–15)
BUN: 30 mg/dL — ABNORMAL HIGH (ref 8–23)
CO2: 22 mmol/L (ref 22–32)
Calcium: 9.4 mg/dL (ref 8.9–10.3)
Chloride: 97 mmol/L — ABNORMAL LOW (ref 98–111)
Creatinine, Ser: 1.84 mg/dL — ABNORMAL HIGH (ref 0.61–1.24)
GFR calc Af Amer: 38 mL/min — ABNORMAL LOW (ref >60)
GFR calc non Af Amer: 33 mL/min — ABNORMAL LOW (ref >60)
Glucose, Bld: 213 mg/dL — ABNORMAL HIGH (ref 70–99)
Potassium: 4.1 mmol/L (ref 3.5–5.1)
Sodium: 133 mmol/L — ABNORMAL LOW (ref 135–145)

## 2019-04-19 LAB — CBC
HCT: 41.6 % (ref 39.0–52.0)
Hemoglobin: 13.4 g/dL (ref 13.0–17.0)
MCH: 27.8 pg (ref 26.0–34.0)
MCHC: 32.2 g/dL (ref 30.0–36.0)
MCV: 86.3 fL (ref 80.0–100.0)
Platelets: 223 10*3/uL (ref 150–400)
RBC: 4.82 MIL/uL (ref 4.22–5.81)
RDW: 14.8 % (ref 11.5–15.5)
WBC: 9.4 10*3/uL (ref 4.0–10.5)
nRBC: 0 % (ref 0.0–0.2)

## 2019-04-19 LAB — BRAIN NATRIURETIC PEPTIDE: B Natriuretic Peptide: 55 pg/mL (ref 0.0–100.0)

## 2019-04-19 LAB — FIBRIN DERIVATIVES D-DIMER (ARMC ONLY): Fibrin derivatives D-dimer (ARMC): 1146.68 ng/mL (FEU) — ABNORMAL HIGH (ref 0.00–499.00)

## 2019-04-19 LAB — TROPONIN I (HIGH SENSITIVITY): Troponin I (High Sensitivity): 15 ng/L (ref <18)

## 2019-04-19 MED ORDER — TECHNETIUM TO 99M ALBUMIN AGGREGATED
4.0000 | Freq: Once | INTRAVENOUS | Status: AC | PRN
  Administered 2019-04-19: 19:00:00 4.19 via INTRAVENOUS

## 2019-04-19 NOTE — Patient Outreach (Signed)
[facility]) Care Management  04/19/2019  Lucas Caldwell [birth year] [id number]  Care coordination cal placed to [place] in regards to patient's application for [place] and Trulicity.  Spoke to [place] who informed the application is still processing and could not provide me with a timeline as to when the process would be complete even though the process has been going on for over 1 month due to a processing error on their end.  Will follow up with [employer] in 3-5 business days.  Corena Tilson P. Cyndi Montejano, [place] Management [phone]
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
## 2019-04-19 NOTE — Telephone Encounter (Signed)
Spoke with wife briefly and then she put patient on the phone. Patient was discharged from hospital on Friday and went home. Says that since he's been home he feels "out of breath." [name] weak and can't even get his clothes on and off with out losing his breath. Patient reports dizziness and sharp left shoulder pain as well. Denies pain in chest or swelling. Patient appears a little out of breath on the phone as well. Patient has longstanding history of shortness of breath. Patient reports this is different than the shortness of breath he experienced prior to hospitalization last week. Patient reports changing position and can't seem to get comfortable and in a better position to breathe. Advised I will route to Dr End for advice and call him back soon.

## 2019-04-19 NOTE — ED Triage Notes (Signed)
Had stent put in last Tuesday.  Was supposed to help sob, but not better and dr end told him to come here.

## 2019-04-19 NOTE — Chronic Care Management (AMB) (Signed)
Chronic Care Management   Follow Up Note   04/19/2019 Name: Lucas Caldwell MRN: [id number] DOB: 02-01-1934  Referred by: [name], DO Reason for referral : Chronic Care Management (Post discharge from hospital on 04-15-2019/Patient currently in ED being evaluated)   Lucas Caldwell is a 86 y.o. year old male who is a primary care patient of [name], DO. The CCM team was consulted for assistance with chronic disease management and care coordination needs.    Review of patient status, including review of consultants reports, relevant laboratory and other test results, and collaboration with appropriate care team members and the patient's provider was performed as part of comprehensive patient evaluation and provision of chronic care management services.    SDOH (Social Determinants of Health) screening performed today: None. See Care Plan for related entries.   Outpatient Encounter Medications as of 04/19/2019  Medication Sig Note   ACCU-CHEK AVIVA PLUS test strip Use to check blood sugar up to 2 times daily    Accu-Chek Softclix Lancets lancets Use to check blood sugar up to 2 times daily.    acetaminophen (TYLENOL) 500 MG tablet Take 1,000 mg by mouth every 6 (six) hours as needed for moderate pain or headache.     Alcohol Swabs 70 % PADS Use when checking blood sugar up to 3x daily    allopurinol (ZYLOPRIM) 100 MG tablet TAKE ONE TABLET BY MOUTH ONCE DAILY (Patient taking differently: Take 100 mg by mouth 2 (two) times daily. )    aspirin 81 MG chewable tablet Chew 1 tablet (81 mg total) by mouth daily.    Blood Glucose Monitoring Suppl (ACCU-CHEK AVIVA PLUS) w/Device KIT Use to check blood sugar up to 2 times daily    bosutinib (BOSULIF) 100 MG tablet Take 100 mg by mouth at bedtime as needed (tired/not feeling well). Take with food.  08/31/2018: Pt's wife states he only takes this as needed when he's "not feeling well"   Dulaglutide (TRULICITY)  4.58 PF/2.9WK SOPN Inject 0.75 mg into the skin once a week. (Patient taking differently: Inject 0.75 mg into the skin every Saturday. )    gabapentin (NEURONTIN) 300 MG capsule Take 2 capsules (600 mg total) by mouth 2 (two) times daily. Max dose = 1266m daily for kidney function (Patient taking differently: Take 600 mg by mouth 2 (two) times daily. )    hydrALAZINE (APRESOLINE) 25 MG tablet Take 1 tablet (25 mg total) by mouth 3 (three) times daily.    Insulin Glargine ([name] KWIKPEN) 100 UNIT/ML SOPN Inject 28 Units into the skin daily.     Insulin Pen Needle 31G X 5 MM MISC 31 g by Does not apply route as directed.    isosorbide mononitrate (IMDUR) 60 MG 24 hr tablet TAKE 1 AND 1/2 TABLETS BY MOUTH ONCE DAILY (Patient taking differently: Take 90 mg by mouth daily. )    levothyroxine (SYNTHROID) 25 MCG tablet TAKE 1 TABLET (25 MCG TOTAL) BY MOUTH DAILY BEFORE BREAKFAST.    loperamide (IMODIUM A-D) 2 MG tablet Take 2 mg by mouth 3 (three) times daily as needed for diarrhea or loose stools.     Menthol-Zinc Oxide (GOLD BOND EX) Apply 1 application topically daily as needed (muscle pain).    metoprolol succinate (TOPROL-XL) 50 MG 24 hr tablet Take 1 tablet (50 mg total) by mouth daily.    Multiple Vitamins-Minerals (MULTIVITAMIN ADULTS 50+ PO) Take 1 tablet by mouth every morning.    nitroGLYCERIN (NITROSTAT) 0.4  MG SL tablet Place 1 tablet (0.4 mg total) under the tongue every 5 (five) minutes x 3 doses as needed for chest pain.    omeprazole (PRILOSEC) 20 MG capsule Take 20 mg by mouth 2 (two) times daily.    Polyethyl Glycol-Propyl Glycol (SYSTANE OP) Place 1 drop into both eyes daily as needed (dry eyes).    polyethylene glycol (MIRALAX / GLYCOLAX) 17 g packet Take 17 g by mouth daily as needed for mild constipation.     PROVENTIL HFA 108 (90 Base) MCG/ACT inhaler Inhale 2 puffs into the lungs every 6 (six) hours as needed for wheezing or shortness of breath.    ranolazine  (RANEXA) 500 MG 12 hr tablet TAKE ONE TABLET BY MOUTH TWICE DAILY (Patient taking differently: Take 500 mg by mouth 2 (two) times daily. )    rosuvastatin (CRESTOR) 5 MG tablet Take 1 tablet (5 mg total) by mouth daily. (Patient taking differently: Take 5 mg by mouth at bedtime. )    senna (SENOKOT) 8.6 MG tablet Take 1 tablet by mouth daily as needed for constipation.    ticagrelor (BRILINTA) 90 MG TABS tablet Take 1 tablet (90 mg total) by mouth 2 (two) times daily.    torsemide (DEMADEX) 20 MG tablet 2 tabs every morning; 1 tab every PM    traMADol (ULTRAM) 50 MG tablet Take 1 tablet (50 mg total) by mouth every 8 (eight) hours as needed for moderate pain. (Patient not taking: Reported on 04/06/2019)    No facility-administered encounter medications on file as of 04/19/2019.     Objective:   Goals Addressed            This Visit's Progress    RN- I am getting more short winded (pt-stated)       Current Barriers:   Knowledge deficit related to basic heart failure pathophysiology and self care management  Patient not weighing and recording daily   Case Manager Clinical Goal(s):   Over the next 60 days, patient will verbalize understanding of Heart Failure Action Plan and when to call doctor  Over the next 60 days, patient will take all Heart Failure mediations as prescribed  Over the next 60 days, patient will weigh daily and record (notifying MD of 3 lb weight gain over night or 5 lb in a week)  Interventions:   Basic overview and discussion of pathophysiology of Heart Failure reviewed   Provided verbal education on low sodium diet  Assessed need for readable accurate scales in home  Provided education about placing scale on hard, flat surface  Advised patient to weigh each morning after emptying bladder  Discussed importance of daily weight and advised patient to weigh and record daily  Reviewed role of diuretics in prevention of fluid overload and management of  heart failure  Discussed s/s of HF and increased fluid. Patient verbally reported: Edema to BLE, increased fatigue, decreased appetite and increased shortness of breath  with small tasks such as putting on his shoes and walking a short distance. Denied using increased pillows. Unable to assess weight gain as the patient was currently being evaluated in the ER for increased Shortness or breath. The patient states his oxygen machine malfunctioned over the weekend and he became increasingly short winded. His wife has called the company. The patient is waiting at the ER to be evaluated by the ER provider.   Reviewed torsemide, spouse states patient has been taking it two times per day related to increase swelling with  no observed improvement of edema or SOB.   Patient has been followed closely by cardiology and recently had studies by pulmonology to determine underlying cause of increased SOB.   Patient reports using 02 but has had a malfunction in his oxygen system.  The spouse called this am to talk with the company about coming to check the oxygen system.   Wt Readings from Last 3 Encounters:  04/19/19 201 lb 8 oz (91.4 kg)  04/15/19 201 lb 11.2 oz (91.5 kg)  04/02/19 208 lb 2 oz (94.4 kg)    BP Readings from Last 3 Encounters:  04/19/19 125/71  04/15/19 117/74  04/02/19 132/68      Patient Self Care Activities:   Takes Heart Failure Medications as prescribed  Please see past updates related to this goal by clicking on the "Past Updates" button in the selected goal          Plan:   The care management team will reach out to the patient again over the next 7 to 14 days.    Noreene Larsson RN, MSN, [place] Mobile: [phone]

## 2019-04-19 NOTE — Telephone Encounter (Signed)
[name] states since pt was D/C from his stent placement, he is still not breathing well.   Pt c/o Shortness Of Breath: STAT if SOB developed within the last 24 hours or pt is noticeably SOB on the phone  1. Are you currently SOB (can you hear that pt is SOB on the phone)? yes  2. How long have you been experiencing SOB? Since stent placement  3. Are you SOB when sitting or when up moving around? Up moving around  4. Are you currently experiencing any other symptoms? No other symptoms

## 2019-04-19 NOTE — Patient Instructions (Signed)
Visit Information  Goals Addressed            This Visit's Progress   . RN- I am getting more short winded (pt-stated)       Current Barriers:  [name] Knowledge deficit related to basic heart failure pathophysiology and self care management . Patient not weighing and recording daily   Case Manager Clinical Goal(s):  [name] Over the next 60 days, patient will verbalize understanding of Heart Failure Action Plan and when to call doctor . Over the next 60 days, patient will take all Heart Failure mediations as prescribed . Over the next 60 days, patient will weigh daily and record (notifying MD of 3 lb weight gain over night or 5 lb in a week)  Interventions:  . Basic overview and discussion of pathophysiology of Heart Failure reviewed  . Provided verbal education on low sodium diet . Assessed need for readable accurate scales in home . Provided education about placing scale on hard, flat surface . Advised patient to weigh each morning after emptying bladder . Discussed importance of daily weight and advised patient to weigh and record daily . Reviewed role of diuretics in prevention of fluid overload and management of heart failure . Discussed s/s of HF and increased fluid. Patient verbally reported: Edema to BLE, increased fatigue, decreased appetite and increased shortness of breath  with small tasks such as putting on his shoes and walking a short distance. Denied using increased pillows. Unable to assess weight gain as the patient was currently being evaluated in the ER for increased Shortness or breath. The patient states his oxygen machine malfunctioned over the weekend and he became increasingly short winded. His wife has called the company. The patient is waiting at the ER to be evaluated by the ER provider.  . Reviewed torsemide, spouse states patient has been taking it two times per day related to increase swelling with no observed improvement of edema or SOB.  . Patient has been followed  closely by cardiology and recently had studies by pulmonology to determine underlying cause of increased SOB.  . Patient reports using 02 but has had a malfunction in his oxygen system.  The spouse called this am to talk with the company about coming to check the oxygen system.   Wt Readings from Last 3 Encounters:  04/19/19 201 lb 8 oz (91.4 kg)  04/15/19 201 lb 11.2 oz (91.5 kg)  04/02/19 208 lb 2 oz (94.4 kg)    BP Readings from Last 3 Encounters:  04/19/19 125/71  04/15/19 117/74  04/02/19 132/68      Patient Self Care Activities:  . Takes Heart Failure Medications as prescribed  Please see past updates related to this goal by clicking on the "Past Updates" button in the selected goal         The patient verbalized understanding of instructions provided today and declined a print copy of patient instruction materials.   The care management team will reach out to the patient again over the next 7 to 14 days.   Noreene Larsson RN, MSN, [facility] Mobile: [phone]

## 2019-04-19 NOTE — Telephone Encounter (Signed)
Both pt and pt's spouse, Lucas Caldwell are aware of recommendations and voiced their understanding.   Nothing further is needed.

## 2019-04-19 NOTE — Telephone Encounter (Addendum)
Called and spoke to pt's spouse, [place] ([place]). Who stated that pt is experiencing increased sob since recent surgery. Sob occurs with rest and exertion.  Denied additional sx.  Pt had stents placed on 04/13/2019.  [name] stated that is wearing 3L QHS, however order was placed for 1L.   Pt does not have an albuterol HFA. Pt is not using Spiriva, as instructed by one of his providers [name] is unsure of providers name). I have advised [name] that if sx worsen, pt will need to go to ED for eval.   Dr. Patsey Berthold, please advise as Dr. Mortimer Fries is unavailable. Thanks (please note that a message was placed to Dr. Saunders Revel as well for recommendations)

## 2019-04-19 NOTE — ED Notes (Signed)
Pt ambulated from bed to restroom with stand by assist. PT gait wobbly when first standing. O2 saturation ranging from 98-100% during ambulation

## 2019-04-19 NOTE — Telephone Encounter (Signed)
I spoke with Lucas Caldwell and his wife.  He felt fairly well at the time of discharge on Friday but has developed progressive shortness of breath since then.  He now gets out of breath with minimal activity and even felt like he was going to "fall out" putting on his clothes this morning.  I have recommended that his wife take him to the emergency department as soon as possible for further evaluation.  [name], MD [facility]

## 2019-04-19 NOTE — Telephone Encounter (Signed)
Needs to be evaluated in the emergency room

## 2019-04-19 NOTE — ED Notes (Signed)
Lab contacted to add on BNP and trop

## 2019-04-19 NOTE — ED Notes (Signed)
Blue tube sent to lab 

## 2019-04-19 NOTE — ED Notes (Signed)
Patient has returned from VQ scan

## 2019-04-19 NOTE — Consult Note (Signed)
Cardiology Consultation:   Patient ID: Lucas Caldwell MRN: [id number]; DOB: December 23, 1933  Admit date: 04/19/2019 Date of Consult: 04/19/2019  Primary Care Provider: [name], DO Primary Cardiologist: [name], MD  Primary Electrophysiologist:  None    Patient Profile:   Lucas Caldwell is a 86 y.o. male with a hx of coronary artery disease, HFrEF, mixed ischemic and nonischemic cardiomyopathy, hypertension, hyperlipidemia, DM2, paroxysmal atrial fibrillation on chronic warfarin, stage III CKD, CML, iron deficiency anemia, GI bleed due to duodenal ulcers, BPH, and hypothyroidism, and who is being seen today for the evaluation of presyncope at the request of Dr. Jacqualine Code.  History of Present Illness:   [name] is an 86 year old male with PMH as above.  He previously underwent PCI of the RCA 08/2016 2/2 persistent DOE complicated by CKD and has needed frequent outpatient adjustment to his diuretics.  He underwent repeat diagnostic cath 08/2018 revealing a 40% left main stenosis with abnormal IFR of 0.86.  It was felt that the drop in IFR was secondary to proximal LAD disease with only a small gradient involving the ostial left main.  Nitrate was increased; however, he continued to experience DOE despite further titration of outpatient diuretics.  He was seen on 04/02/2019 with arrangements made for diagnostic catheterization.  Repeat cath as above showed multivessel CAD, similar to prior catheterizations in 08/2018 with moderately elevated left heart filling pressure and mildly elevated right heart filling pressure.  60% ostial left main stenosis with 60/70% mid LAD disease was noted with nonobstructive circumflex and RCA disease.  He was successfully treated with DES to the LAD.  Right heart cath was also performed and notable for elevated PCWP of 86mHg. He was admitted for diuresis over the next two days and noted improved DOE.  His metoprolol had been titrated to 50 mg daily  with hydralazine added for additional afterload reduction.  Given his prior history of GI bleed and in the setting of recent PCI with DAPT, warfarin was on hold.  With the resumption of warfarin and discontinuation of Brilinta could be reconsidered in approximately 3 months.  He diuresed -3.8 L from admission with weight down to 91.5 kg.  He reported feeling 50 to 75% better prior to discharge.  He was discharged on torsemide 40 mg every morning and 20 mg in the evening.  Recommendation was for BMP within 1 week of discharge and close follow-up in clinic.  He was scheduled for cardiac rehab as well.  He was continued on beta-blocker, hydralazine, and nitrate.  Renal function precluded addition of ACE/ARB/ARNI/MRA.    On 2/1, he called the office and reported that, though he felt fairly well at the time of discharge on Friday, he had since developed progressive shortness of breath and dizziness.  He reported feeling out of breath and dizziness with minimal activity, such as putting on his clothes.  In this setting, [facility] Once in the ED,  the patient clarified that he is concerned regarding his ongoing exertional dizziness and DOE since his cardiac catheterization.  He denies any progressive symptoms but states that he just feels as if he should be able to breathe and is frustrated with his ongoing dyspnea and exertional dizziness s/p his  recent stent.  He feels that he may need an additional stent or different medications to feel less exertional sx. he reports that he felt exertional dizziness earlier with attempting to get dressed; however, he denies  any loss of consciousness syncope /fall.  He reports instead exertional dizziness with resolution within minutes upon rest.  [name] not weighing himself at home but denies increased volume, LEE, or abdominal distention from discharge with stable 2 pillow orthopnea.  No early satiety.  No racing heart rate or  palpitations.  He has been taking his increased torsemide 40 mg every morning and 20 mg in the evening.  He continues to note successful urine output.  He notes medication compliance with DAPT.  No s./sx of acute bleeding. He has not noted any correlation between his symptoms and taking his new medications. His wife mentions that he recently was placed on nighttime oxygen but may need daytime oxygen as well.  In the ED, vitals significant for heart rate 100 bpm, BP 125/71, SPO2 99% on room air, temperature 98 F.  Labs significant for WBC 9.4, hemoglobin 13.4, hematocrit 41.6, platelets 223, sodium 133, potassium 4.1, creatinine increased from baseline at 1.84 with elevated BUN 30.  EKG reviewed as below and without acute ST or T changes and NSR, 98 bpm, bifascicular block/right bundle branch block and LAFB, LVH. Chest x-ray without signs of active disease.  BNP within normal limits.  Recommendations as below.  Weight 91.4 kg and consistent with discharge weight as above.  Heart Pathway Score:     Past Medical History:  Diagnosis Date  . Arthritis    "probably in his legs" (08/15/2016)  . Blind right eye   . BPH (benign prostatic hyperplasia)   . Breast asymmetry    Left breast is larger, present for several years.  [name] CAD (coronary artery disease)    a. Cath in the late 90's - reportedly ok;  b. 2014 s/p stenting x 2 @ [facility]; c 08/19/16 Cath/PCI with DES -> RCA, plan to treat LM 70% medically. Seen by surgery and felt to be too high risk for CABG; d. 08/2018 Cath: LM 70 (iFR 0.86), LAD 20ost, 40p, 50/70m D1 70, LCX patent stent, OM1 30, RCA patent stent, 459mSR, 40d, RPDA 30. PCWP 8. CO/CI 4.0/2.1.  . Marland Kitchenhronic combined systolic (congestive) and diastolic (congestive) heart failure ([facility]   a. Previously reduced EF-->50% by echo in 2012;  b. 06/2015 Echo: EF 50-55%  c. 07/2016 Echo: EF 45-50%; d. 12/2017 Echo: EF 55%, Gr1 DD, mild MR; e. 08/2018 Echo: EF 35-40%, mildly dil PA.  . Marland Kitchenhronic kidney disease  (CKD), stage IV (severe) ([facility]  . 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 GERD (gastroesophageal reflux disease)   . GIB (gastrointestinal bleeding)    a. 12/2017 3 unit PRBC GIB in setting of coumadin-->Endo/colon multiple duodenal ulcers and a single bleeding ulcer in the proximal ascending colon status post hemostatic clipping x2.  . Gout   . Hyperlipemia   . Hypertension   . Hypothyroidism   . Iron deficiency anemia   . Ischemic cardiomyopathy    a. Previously reduced EF-->50% by echo in 2012;  b. 06/2015 Echo: EF 50-55%, Gr2 DD, mild MR, mildly dil LA, Ao sclerosis, mild TR;  c. 07/2016 Echo: EF 45-50%; d. 12/2017 Echo: EF 55%, Gr1 DD, mild MR; e. 08/2018 Echo: EF 35-40%.  . Migraine    "in the 1960s" (08/15/2016)  . PAF (paroxysmal atrial fibrillation) (HCC)    a. ? Dx 2014-->s/p DCCV;  b. CHA2DS2VASc = 6--> Coumadin.  . [place]state cancer ([place]  . RBBB   . Type II diabetes mellitus ([facility]    Past Surgical  History:  Procedure Laterality Date  . CATARACT EXTRACTION W/ INTRAOCULAR LENS  IMPLANT, BILATERAL Bilateral   . COLONOSCOPY N/A 01/13/2018   Procedure: COLONOSCOPY;  Surgeon: Lin Landsman, MD;  [facility] END[facility]COPY;  Service: Gastroenterology;  Laterality: N/A;  . COLONOSCOPY WITH PROPOFOL N/A 01/01/2018   Procedure: COLONOSCOPY WITH PROPOFOL;  Surgeon: Lin Landsman, MD;  Location: [facility] ENDOSCOPY;  Service: Gastroenterology;  Laterality: N/A;  . CORONARY ANGIOPLASTY WITH STENT PLACEMENT  09/2012   2 stents  . CORONARY STENT INTERVENTION N/A 08/19/2016   Procedure: Coronary Stent Intervention;  Surgeon: [name], MD;  Location: [place] CV LAB;  Service: Cardiovascular;  Laterality: N/A;  . CORONARY STENT INTERVENTION N/A 04/13/2019   Procedure: CORONARY STENT INTERVENTION;  Surgeon: [name], MD;  Location: [place] CV LAB;  Service: Cardiovascular;  Laterality: N/A;  . ESOPHAGEAL MANOMETRY N/A 12/16/2017   Procedure: ESOPHAGEAL  MANOMETRY (EM);  Surgeon: Lin Landsman, MD;  Location: [facility] ENDOSCOPY;  Service: Gastroenterology;  Laterality: N/A;  . ESOPHAGOGASTRODUODENOSCOPY N/A 12/20/2016   Procedure: ESOPHAGOGASTRODUODENOSCOPY (EGD);  Surgeon: Lin Landsman, MD;  Location: [facility] ENDOSCOPY;  Service: Gastroenterology;  Laterality: N/A;  . ESOPHAGOGASTRODUODENOSCOPY N/A 01/13/2018   Procedure: ESOPHAGOGASTRODUODENOSCOPY (EGD);  Surgeon: Lin Landsman, MD;  Location[facility]COPY;  Service: Gastroenterology;  Laterality: N/A;  . ESOPHAGOGASTRODUODENOSCOPY (EGD) WITH PROPOFOL N/A 11/03/2017   Procedure: ESOPHAGOGASTRODUODENOSCOPY (EGD) WITH PROPOFOL;  Surgeon: Lin Landsman, MD;  Location: [facility] ENDOSCOPY;  Service: Gastroenterology;  Laterality: N/A;  . EYE SURGERY    . HERNIA REPAIR     "navel"  . LEFT HEART CATH AND CORONARY ANGIOGRAPHY N/A 08/14/2016   Procedure: Left Heart Cath and Coronary Angiography;  Surgeon: [name], MD;  Location: [place] CV LAB;  Service: Cardiovascular;  Laterality: N/A;  . RIGHT/LEFT HEART CATH AND CORONARY ANGIOGRAPHY N/A 09/01/2018   Procedure: RIGHT/LEFT HEART CATH AND CORONARY ANGIOGRAPHY;  Surgeon: [name], MD;  Location: [place] CV LAB;  Service: Cardiovascular;  Laterality: N/A;  . RIGHT/LEFT HEART CATH AND CORONARY ANGIOGRAPHY N/A 04/13/2019   Procedure: RIGHT/LEFT HEART CATH AND CORONARY ANGIOGRAPHY;  Surgeon: [name], MD;  Location: [place] CV LAB;  Service: Cardiovascular;  Laterality: N/A;  . TOE AMPUTATION Right    "big toe"     Home Medications:  Prior to Admission medications   Medication Sig Start Date End Date Taking? Authorizing Provider  ACCU-CHEK AVIVA PLUS test strip Use to check blood sugar up to 2 times daily 11/25/18   [name], DO  Accu-Chek Softclix Lancets lancets Use to check blood sugar up to 2 times daily. 11/25/18   Karamalegos, [name], DO  acetaminophen (TYLENOL) 500 MG tablet  Take 1,000 mg by mouth every 6 (six) hours as needed for moderate pain or headache.     [provider]  Alcohol Swabs 70 % PADS Use when checking blood sugar up to 3x daily 04/09/17   Karamalegos, [name], DO  allopurinol (ZYLOPRIM) 100 MG tablet TAKE ONE TABLET BY MOUTH ONCE DAILY Patient taking differently: Take 100 mg by mouth 2 (two) times daily.  02/22/19   Karamalegos, [name], DO  aspirin 81 MG chewable tablet Chew 1 tablet (81 mg total) by mouth daily. 04/16/19   Theora Gianotti, NP  Blood Glucose Monitoring Suppl (ACCU-CHEK AVIVA PLUS) w/Device KIT Use to check blood sugar up to 2 times daily 11/25/18   Parks Ranger, [name], DO  bosutinib (BOSULIF) 100 MG tablet Take 100 mg by  mouth at bedtime as needed (tired/not feeling well). Take with food.     [provider]  Dulaglutide (TRULICITY) 5.45 GY/5.6LS SOPN Inject 0.75 mg into the skin once a week. Patient taking differently: Inject 0.75 mg into the skin every Saturday.  10/31/17   Karamalegos, [name], DO  gabapentin (NEURONTIN) 300 MG capsule Take 2 capsules (600 mg total) by mouth 2 (two) times daily. Max dose = 1251m daily for kidney function Patient taking differently: Take 600 mg by mouth 2 (two) times daily.  03/25/19   Karamalegos, ADevonne Doughty DO  hydrALAZINE (APRESOLINE) 25 MG tablet Take 1 tablet (25 mg total) by mouth 3 (three) times daily. 04/15/19   BTheora Gianotti NP  Insulin Glargine ([name] KWIKPEN) 100 UNIT/ML SOPN Inject 28 Units into the skin daily.     [provider]  Insulin Pen Needle 31G X 5 MM MISC 31 g by Does not apply route as directed. 04/16/17   KMikey College NP  isosorbide mononitrate (IMDUR) 60 MG 24 hr tablet TAKE 1 AND 1/2 TABLETS BY MOUTH ONCE DAILY Patient taking differently: Take 90 mg by mouth daily.  03/23/19   End, CHarrell Gave MD  levothyroxine (SYNTHROID) 25 MCG tablet TAKE 1 TABLET (25 MCG TOTAL) BY MOUTH DAILY BEFORE BREAKFAST.  09/28/18   Karamalegos, ADevonne Doughty DO  loperamide (IMODIUM A-D) 2 MG tablet Take 2 mg by mouth 3 (three) times daily as needed for diarrhea or loose stools.     [provider]  Menthol-Zinc Oxide (GOLD BOND EX) Apply 1 application topically daily as needed (muscle pain).    [provider]  metoprolol succinate (TOPROL-XL) 50 MG 24 hr tablet Take 1 tablet (50 mg total) by mouth daily. 04/15/19   BTheora Gianotti NP  Multiple Vitamins-Minerals (MULTIVITAMIN ADULTS 50+ PO) Take 1 tablet by mouth every morning.    [provider]  nitroGLYCERIN (NITROSTAT) 0.4 MG SL tablet Place 1 tablet (0.4 mg total) under the tongue every 5 (five) minutes x 3 doses as needed for chest pain. 12/23/18   End, CHarrell Gave MD  omeprazole (PRILOSEC) 20 MG capsule Take 20 mg by mouth 2 (two) times daily.    [provider]  Polyethyl Glycol-Propyl Glycol (SYSTANE OP) Place 1 drop into both eyes daily as needed (dry eyes).    [provider]  polyethylene glycol (MIRALAX / GLYCOLAX) 17 g packet Take 17 g by mouth daily as needed for mild constipation.     [provider]  PROVENTIL HFA 108 (90 Base) MCG/ACT inhaler Inhale 2 puffs into the lungs every 6 (six) hours as needed for wheezing or shortness of breath. 07/16/18   Karamalegos, ADevonne Doughty DO  ranolazine (RANEXA) 500 MG 12 hr tablet TAKE ONE TABLET BY MOUTH TWICE DAILY Patient taking differently: Take 500 mg by mouth 2 (two) times daily.  03/23/19   End, CHarrell Gave MD  rosuvastatin (CRESTOR) 5 MG tablet Take 1 tablet (5 mg total) by mouth daily. Patient taking differently: Take 5 mg by mouth at bedtime.  07/14/18 12/23/27  End, CHarrell Gave MD  senna (SENOKOT) 8.6 MG tablet Take 1 tablet by mouth daily as needed for constipation.    [provider]  ticagrelor (BRILINTA) 90 MG TABS tablet Take 1 tablet (90 mg total) by mouth 2 (two) times daily. 04/15/19   BTheora Gianotti NP  torsemide  ([facility] 20 MG tablet 2 tabs every morning; 1 tab every PM 04/15/19   BTheora Gianotti NP  traMADol (ULTRAM) 50 MG tablet Take 1 tablet (50 mg total) by mouth every 8 (eight) hours as needed for moderate pain. Patient not taking: Reported on 04/06/2019 11/25/18   [name], DO    Inpatient Medications: Scheduled Meds:  Continuous Infusions:  PRN Meds:   Allergies:    Allergies  Allergen Reactions  . Clopidogrel Anaphylaxis    altered mental status;  (electrolytes "out of wack" and confusion per family; THEY DO NOT REMEMBER ANY OTHER REACTION)- MSB 10/10/15  . Ciprofloxacin Itching  . Mirtazapine Diarrhea    Bad dreams   . Spironolactone     gynecomastia    Social History:   Social History   Socioeconomic History  . Marital status: Married    Spouse name: Not on file  . Number of children: Not on file  . Years of education: Not on file  . Highest education level: 7th grade  Occupational History  . Occupation: Retired    Comment: Owned his own [place].  Tobacco Use  . Smoking status: Former Smoker    Packs/day: 2.00    Years: 15.00    Pack years: 30.00    Types: Cigarettes    Quit date: 1979    Years since quitting: 42.1  . Smokeless tobacco: Former Systems developer  . Tobacco comment: quit 1979  Substance and Sexual Activity  . Alcohol use: No    Comment: previously drank heavily - quit 1979.  . Drug use: No  . Sexual activity: Never  Other Topics Concern  . Not on file  Social History Narrative   Working on transportation truck part time    Social Determinants of Radio broadcast assistant Strain:   . Difficulty of Paying Living Expenses: Not on file  Food Insecurity:   . Worried About Charity fundraiser in the Last Year: Not on file  . Ran Out of Food in the Last Year: Not on file  Transportation Needs:   . Lack of Transportation (Medical): Not on file  . Lack of Transportation (Non-Medical): Not on file  Physical Activity:    . Days of Exercise per Week: Not on file  . Minutes of Exercise per Session: Not on file  Stress:   . Feeling of Stress : Not on file  Social Connections:   . Frequency of Communication with Friends and Family: Not on file  . Frequency of Social Gatherings with Friends and Family: Not on file  . Attends Religious Services: Not on file  . Active Member of Clubs or Organizations: Not on file  . Attends Archivist Meetings: Not on file  . Marital Status: Not on file  Intimate Partner Violence:   . Fear of Current or Ex-Partner: Not on file  . Emotionally Abused: Not on file  . Physically Abused: Not on file  . Sexually Abused: Not on file    Family History:    Family History  Problem Relation Age of Onset  . Brain cancer Father   . Diabetes Sister   . Heart attack Sister      ROS:  Please see the history of present illness.  Review of Systems  Constitutional: Negative for diaphoresis.  Respiratory: Positive for shortness of breath.        DOE  Cardiovascular: Positive for orthopnea. Negative for chest pain, palpitations and leg swelling.       Stable two-pillow orthopnea, abdominal distention, and lower extremity edema and not increased from discharge.  Gastrointestinal: Negative  for blood in stool, constipation, diarrhea, melena, nausea and vomiting.  Genitourinary: Negative for dysuria, flank pain, frequency, hematuria and urgency.  Musculoskeletal: Negative for falls.  Neurological: Positive for dizziness. Negative for sensory change, focal weakness and loss of consciousness.       Dizziness with exertion  Psychiatric/Behavioral: Negative for substance abuse.  All other systems reviewed and are negative.   All other ROS reviewed and negative.     Physical Exam/Data:   Vitals:   04/19/19 1228 04/19/19 1229  BP: 125/71   Pulse: 100   Resp: 18   Temp: 98 F (36.7 C)   TempSrc: Oral   SpO2: 99%   Weight:  91.4 kg  Height:  '5\' 4"'  (1.626 m)   No intake  or output data in the 24 hours ending 04/19/19 1618 Last 3 Weights 04/19/2019 04/15/2019 04/13/2019  Weight (lbs) 201 lb 8 oz 201 lb 11.2 oz 204 lb 9.4 oz  Weight (kg) 91.4 kg 91.491 kg 92.8 kg     Body mass index is 34.59 kg/m.  General:  Well nourished, well developed, in no acute distress.  Sitting in the hallway of the emergency department with his wife. HEENT: normal Neck: no JVD Vascular: No carotid bruits on auscultation; radial pulses 1+ bilaterally Cardiac:  normal S1, S2; RRR; no murmur  Lungs: Mild reduction in bilateral breath sounds, no wheezing, rhonchi or rales  Abd: firm, nontender, no hepatomegaly  Ext: no bilateral lower extremity edema Musculoskeletal:  No deformities, BUE and BLE strength normal and equal Skin: warm and dry  Neuro:  No focal abnormalities noted Psych:  Normal affect   EKG:  The EKG was personally reviewed and demonstrates:  EKG NSR, 98 bpm, bifascicular block/right bundle branch block and LAFB, LVH Telemetry:  Telemetry was personally reviewed and demonstrates:  NSR   Relevant CV Studies:  Cardiac Catheterization and Percutaneous Coronary Intervention 1.26.2021     Left Main  Vessel is large.  Ost LM lesion 60% stenosed  Ost LM lesion is 60% stenosed.  Left Anterior Descending  Vessel is large.  Ost LAD lesion 20% stenosed  Ost LAD lesion is 20% stenosed.  Prox LAD lesion 40% stenosed  Prox LAD lesion is 40% stenosed. The lesion is eccentric. The lesion is calcified.  Mid LAD-1 lesion 60% stenosed  Mid LAD-1 lesion is 60% stenosed. The lesion is moderately calcified.  Mid LAD-2 lesion 70% stenosed  Mid LAD-2 lesion is 70% stenosed. The lesion is eccentric.      **The LAD was successfully treated with a 2.5 x 15 mm resolute Onyx drug-eluting stent**  First Diagonal Branch  Vessel is moderate in size.  1st Diag lesion 70% stenosed  1st Diag lesion is 70% stenosed.  Second Diagonal Branch   Vessel is small in size.   Third Diagonal Branch     Vessel is small in size.    Left Circumflex  Vessel is large.  Mid Cx to Dist Cx lesion 0% stenosed  Non-stenotic Mid Cx to Dist Cx lesion was previously treated.  First Obtuse Marginal Branch  Vessel is large in size.  1st Mrg lesion 30% stenosed  1st Mrg lesion is 30% stenosed.  Second Obtuse Marginal Branch  Vessel is small in size.  Right Coronary Artery  Vessel is moderate in size.  Prox RCA to Mid RCA lesion 0% stenosed  Non-stenotic Prox RCA to Mid RCA lesion was previously treated.  Mid RCA lesion 40% stenosed  Mid RCA lesion is 40%  stenosed. The lesion was previously treated using a stent (unknown type) over 2 years ago.  Dist RCA lesion 40% stenosed  Dist RCA lesion is 40% stenosed.  Right Posterior Descending Artery  Vessel is moderate in size.  RPDA lesion 30% stenosed  RPDA lesion is 30% stenosed.  Right Posterior Atrioventricular Artery  Vessel is small in size.   Right Heart Pressures RA (mean): 10 mmHg RV (S/EDP): 53/12 mmHg PA (S/D, mean): 53/30 (40) mmHg PCWP (mean): 28 mmHg  Ao sat: 100% PA sat: 67%  Fick CO: 4.2 L/min Fick CI: 2.1 L/min/m^2   2D Echocardiogram 6.26.2020 1. Severe hypokinesis of the left ventricular, entire inferior wall. 2. The left ventricle has moderately reduced systolic function, with an ejection fraction of 35-40%. The cavity size was normal. There is mildly increased left ventricular wall thickness. Left ventricular diastolic Doppler parameters are indeterminate.  Left ventricular diffuse hypokinesis. 3. The tricuspid valve is grossly normal. 4. The aortic valve is tricuspid. 5. Mildly dilated pulmonary artery. 6. The interatrial septum was not well visualized.  Laboratory Data:  High Sensitivity Troponin:  No results for input(s): TROPONINIHS in the last 720 hours.   Cardiac EnzymesNo results for input(s): TROPONINI in the last 168 hours. No results for input(s): TROPIPOC in the last 168 hours.  Chemistry Recent  Labs  Lab 04/14/19 0435 04/15/19 0937 04/19/19 1239  NA 137 136 133*  K 3.5 4.1 4.1  CL 103 103 97*  CO2 '23 22 22  ' GLUCOSE 141* 179* 213*  BUN 18 23 30*  CREATININE 1.25* 1.45* 1.84*  CALCIUM 9.2 9.3 9.4  GFRNONAA 53* 44* 33*  GFRAA >60 51* 38*  ANIONGAP '11 11 14    ' No results for input(s): PROT, ALBUMIN, AST, ALT, ALKPHOS, BILITOT in the last 168 hours. Hematology Recent Labs  Lab 04/14/19 0435 04/19/19 1239  WBC 9.6 9.4  RBC 4.36 4.82  HGB 12.3* 13.4  HCT 37.1* 41.6  MCV 85.1 86.3  MCH 28.2 27.8  MCHC 33.2 32.2  RDW 14.6 14.8  PLT 181 223   BNPNo results for input(s): BNP, PROBNP in the last 168 hours.  DDimer No results for input(s): DDIMER in the last 168 hours.   Radiology/Studies:  DG Chest 2 View  Result Date: 04/19/2019 CLINICAL DATA:  Worsening shortness of breath. Recent coronary stent placement. EXAM: CHEST - 2 VIEW COMPARISON:  Chest x-ray dated December 15, 2018. FINDINGS: The heart size and mediastinal contours are within normal limits. Atherosclerotic calcification of the aortic arch. Normal pulmonary vascularity. No focal consolidation, pleural effusion, or pneumothorax. No acute osseous abnormality. IMPRESSION: No active cardiopulmonary disease. Electronically Signed   By: Titus Dubin M.D.   On: 04/19/2019 13:50    Assessment and Plan:   Exertional dizziness and DOE --Reports chronic exertional DOE and dizziness that was present prior to his cardiac catheterization.  Feels frustrated that this has continued past his recent stenting.  Presents today when he again noted his dizziness and DOE while putting on clothes, as he was concerned that he may need another stent or different medications given his ongoing symptoms.  Euvolemic on exam today with weight in ED consistent with that of discharge weight. Saturations high 90s to 100s on room air.  Chest x-ray without acute disease.  EKG without acute ST or T changes as above.  Given his report of DOE and  dizziness in the setting of recently held warfarin secondary to DAPT, will check D-dimer.  If elevated, consider VQ scan  for further work-up of pulmonary embolism and given current renal function.  If PE work-up negative, can likely be discharged home and seen in the clinic as an outpatient.  Given his slight bump in renal function, recheck of BMET in clinic.  Also consider work-up with bilateral carotid scans to rule out carotid disease as contributing to his symptoms of dizziness.   Chronic combined systolic and diastolic heart failure Mixed ischemic cardiomyopathy/NICM --Echo as above.  Following cardiac catheterization, was admitted for IV diuresis and -3.8 L with patient reporting 50 to 75% improvement on discharge.  Euvolemic on exam with stable weight from discharge.  No indication for admission or need for IV diuresis with slight increase in Cr noted on labs today. Continue medical management as above.  Recheck BMET at outpatient follow-up within the next week.    CAD s/p LHC and PCI/DES to LAD on 1/26  --No chest pain since discharge. DOE and dizziness. EKG without acute ST or T changes.  He reports medication compliance. Considered is his newly started Brilinta; however, patient does not appreciate in association of his current symptoms with start of medications.  He sx are ongoing and prior to his most recent admission.  Consider etiology of ongoing symptoms 2/2 known residual disease and COPD. No indication at this time for emergent cardiac catheterization.  Recommend rule out PE prior to discharge with ddimer +/- VQ scan. If PE work-up unrevealing, can likely be discharged home and seen in the clinic as an outpatient with subsequent carotid studies to rule out etiology 2/2 carotid dz. Repeat BMET as clinic in 1 week as above. Continue ASA, Brilinta, beta-blocker, statin, nitrate, and Ranexa.  Pending PE workup, warfarin should continue to be held at discharge until 3 months of discharge with  discontinuation of Brilinta at that time.  CKD III --Recheck BMET at outpatient follow-up given bump in renal function.  Essential hypertension --Current BP well controlled.  Continue current medications.   For questions or updates, please contact [place] Please consult [email or web address] for contact info under     Signed, Arvil Chaco, PA-C  04/19/2019 4:18 PM

## 2019-04-20 ENCOUNTER — Telehealth: Payer: Self-pay | Admitting: *Deleted

## 2019-04-20 ENCOUNTER — Other Ambulatory Visit: Payer: Self-pay | Admitting: Family Medicine

## 2019-04-20 DIAGNOSIS — N184 Chronic kidney disease, stage 4 (severe): Secondary | ICD-10-CM | POA: Diagnosis not present

## 2019-04-20 DIAGNOSIS — J432 Centrilobular emphysema: Secondary | ICD-10-CM | POA: Diagnosis not present

## 2019-04-20 DIAGNOSIS — E1140 Type 2 diabetes mellitus with diabetic neuropathy, unspecified: Secondary | ICD-10-CM | POA: Diagnosis not present

## 2019-04-20 DIAGNOSIS — I480 Paroxysmal atrial fibrillation: Secondary | ICD-10-CM | POA: Diagnosis not present

## 2019-04-20 DIAGNOSIS — I2510 Atherosclerotic heart disease of native coronary artery without angina pectoris: Secondary | ICD-10-CM | POA: Diagnosis not present

## 2019-04-20 DIAGNOSIS — E1122 Type 2 diabetes mellitus with diabetic chronic kidney disease: Secondary | ICD-10-CM | POA: Diagnosis not present

## 2019-04-20 DIAGNOSIS — I255 Ischemic cardiomyopathy: Secondary | ICD-10-CM | POA: Diagnosis not present

## 2019-04-20 DIAGNOSIS — I5042 Chronic combined systolic (congestive) and diastolic (congestive) heart failure: Secondary | ICD-10-CM | POA: Diagnosis not present

## 2019-04-20 DIAGNOSIS — E1142 Type 2 diabetes mellitus with diabetic polyneuropathy: Secondary | ICD-10-CM

## 2019-04-20 DIAGNOSIS — R0602 Shortness of breath: Secondary | ICD-10-CM

## 2019-04-20 DIAGNOSIS — I130 Hypertensive heart and chronic kidney disease with heart failure and stage 1 through stage 4 chronic kidney disease, or unspecified chronic kidney disease: Secondary | ICD-10-CM | POA: Diagnosis not present

## 2019-04-20 MED ORDER — ACCU-CHEK SOFTCLIX LANCETS MISC: 3 refills | Status: DC

## 2019-04-20 MED ORDER — ACCU-CHEK AVIVA PLUS VI STRP: ORAL_STRIP | 3 refills | Status: DC

## 2019-04-20 NOTE — Telephone Encounter (Signed)
Order entered. Routing to scheduling to call and schedule with patient. Needs echo prior to appointment. Please let me know if you need any help.

## 2019-04-20 NOTE — Telephone Encounter (Signed)
-----   Message from [name], MD sent at 04/20/2019  6:54 AM EST ----- Regarding: ED follow-up Hi Anderson [place],  Looks like Mr. Cueto' ED evaluation did not reveal an obvious cause of his dyspnea; he was discharged yesterday evening.  Can you see if we can do an echo before or on the day of his f/u visit with me next week for reevaluation of shortness of breath and cardiomyopathy?  Thanks.  Gerald Stabs

## 2019-04-22 ENCOUNTER — Ambulatory Visit (INDEPENDENT_AMBULATORY_CARE_PROVIDER_SITE_OTHER): Payer: Medicare HMO

## 2019-04-22 ENCOUNTER — Telehealth: Payer: Medicare HMO | Admitting: General Practice

## 2019-04-22 ENCOUNTER — Ambulatory Visit: Payer: Self-pay | Admitting: General Practice

## 2019-04-22 ENCOUNTER — Other Ambulatory Visit: Payer: Self-pay

## 2019-04-22 DIAGNOSIS — I5032 Chronic diastolic (congestive) heart failure: Secondary | ICD-10-CM

## 2019-04-22 DIAGNOSIS — I10 Essential (primary) hypertension: Secondary | ICD-10-CM

## 2019-04-22 DIAGNOSIS — R0602 Shortness of breath: Secondary | ICD-10-CM

## 2019-04-22 DIAGNOSIS — I255 Ischemic cardiomyopathy: Secondary | ICD-10-CM

## 2019-04-22 DIAGNOSIS — Z09 Encounter for follow-up examination after completed treatment for conditions other than malignant neoplasm: Secondary | ICD-10-CM

## 2019-04-22 DIAGNOSIS — E1121 Type 2 diabetes mellitus with diabetic nephropathy: Secondary | ICD-10-CM

## 2019-04-22 NOTE — Chronic Care Management (AMB) (Signed)
Chronic Care Management   Follow Up Note   04/22/2019 Name: Lucas Caldwell MRN: [id number] DOB: 1933-09-28  Referred by: [name], DO Reason for referral : Chronic Care Management [facility] discharge/ED visit/ Chronic Conditions)   Lucas Caldwell is a 86 y.o. year old male who is a primary care patient of [name], DO. The CCM team was consulted for assistance with chronic disease management and care coordination needs.    Review of patient status, including review of consultants reports, relevant laboratory and other test results, and collaboration with appropriate care team members and the patient's provider was performed as part of comprehensive patient evaluation and provision of chronic care management services.    SDOH (Social Determinants of Health) screening performed today: Stress. See Care Plan for related entries.   Outpatient Encounter Medications as of 04/22/2019  Medication Sig Note   ACCU-CHEK AVIVA PLUS test strip Use to check blood sugar up to 2 times daily    Accu-Chek Softclix Lancets lancets Use to check blood sugar up to 2 times daily.    acetaminophen (TYLENOL) 500 MG tablet Take 1,000 mg by mouth every 6 (six) hours as needed for moderate pain or headache.     Alcohol Swabs 70 % PADS Use when checking blood sugar up to 3x daily    allopurinol (ZYLOPRIM) 100 MG tablet TAKE ONE TABLET BY MOUTH ONCE DAILY (Patient taking differently: Take 100 mg by mouth 2 (two) times daily. )    aspirin 81 MG chewable tablet Chew 1 tablet (81 mg total) by mouth daily.    Blood Glucose Monitoring Suppl (ACCU-CHEK AVIVA PLUS) w/Device KIT Use to check blood sugar up to 2 times daily    bosutinib (BOSULIF) 100 MG tablet Take 100 mg by mouth at bedtime as needed (tired/not feeling well). Take with food.  08/31/2018: Pt's wife states he only takes this as needed when he's "not feeling well"   Dulaglutide (TRULICITY) 6.27 OJ/5.0KX SOPN Inject 0.75  mg into the skin once a week. (Patient taking differently: Inject 0.75 mg into the skin every Saturday. )    gabapentin (NEURONTIN) 300 MG capsule Take 2 capsules (600 mg total) by mouth 2 (two) times daily. Max dose = 1236m daily for kidney function (Patient taking differently: Take 600 mg by mouth 2 (two) times daily. )    hydrALAZINE (APRESOLINE) 25 MG tablet Take 1 tablet (25 mg total) by mouth 3 (three) times daily.    Insulin Glargine ([name] KWIKPEN) 100 UNIT/ML SOPN Inject 28 Units into the skin daily.     Insulin Pen Needle 31G X 5 MM MISC 31 g by Does not apply route as directed.    isosorbide mononitrate (IMDUR) 60 MG 24 hr tablet TAKE 1 AND 1/2 TABLETS BY MOUTH ONCE DAILY (Patient taking differently: Take 90 mg by mouth daily. )    levothyroxine (SYNTHROID) 25 MCG tablet TAKE 1 TABLET (25 MCG TOTAL) BY MOUTH DAILY BEFORE BREAKFAST.    loperamide (IMODIUM A-D) 2 MG tablet Take 2 mg by mouth 3 (three) times daily as needed for diarrhea or loose stools.     Menthol-Zinc Oxide (GOLD BOND EX) Apply 1 application topically daily as needed (muscle pain).    metoprolol succinate (TOPROL-XL) 50 MG 24 hr tablet Take 1 tablet (50 mg total) by mouth daily.    Multiple Vitamins-Minerals (MULTIVITAMIN ADULTS 50+ PO) Take 1 tablet by mouth every morning.    nitroGLYCERIN (NITROSTAT) 0.4 MG SL tablet Place 1 tablet (  0.4 mg total) under the tongue every 5 (five) minutes x 3 doses as needed for chest pain.    omeprazole (PRILOSEC) 20 MG capsule Take 20 mg by mouth 2 (two) times daily.    Polyethyl Glycol-Propyl Glycol (SYSTANE OP) Place 1 drop into both eyes daily as needed (dry eyes).    polyethylene glycol (MIRALAX / GLYCOLAX) 17 g packet Take 17 g by mouth daily as needed for mild constipation.     PROVENTIL HFA 108 (90 Base) MCG/ACT inhaler Inhale 2 puffs into the lungs every 6 (six) hours as needed for wheezing or shortness of breath.    ranolazine (RANEXA) 500 MG 12 hr tablet TAKE  ONE TABLET BY MOUTH TWICE DAILY (Patient taking differently: Take 500 mg by mouth 2 (two) times daily. )    rosuvastatin (CRESTOR) 5 MG tablet Take 1 tablet (5 mg total) by mouth daily. (Patient taking differently: Take 5 mg by mouth at bedtime. )    senna (SENOKOT) 8.6 MG tablet Take 1 tablet by mouth daily as needed for constipation.    ticagrelor (BRILINTA) 90 MG TABS tablet Take 1 tablet (90 mg total) by mouth 2 (two) times daily.    torsemide (DEMADEX) 20 MG tablet 2 tabs every morning; 1 tab every PM    traMADol (ULTRAM) 50 MG tablet Take 1 tablet (50 mg total) by mouth every 8 (eight) hours as needed for moderate pain. (Patient not taking: Reported on 04/06/2019)    No facility-administered encounter medications on file as of 04/22/2019.     Objective:   Goals Addressed            This Visit's Progress    RN- I am getting more short winded but I feel better now (pt-stated)       Current Barriers:   Knowledge deficit related to basic heart failure pathophysiology and self care management   Case Manager Clinical Goal(s):   Over the next 60 days, patient will verbalize understanding of Heart Failure Action Plan and when to call doctor  Over the next 60 days, patient will take all Heart Failure mediations as prescribed  Over the next 60 days, patient will weigh daily and record (notifying MD of 3 lb weight gain over night or 5 lb in a week)  Over the next 60 days, patient will take blood pressure daily and record, notifying the provider for blood pressure outside of set parameters  Interventions:   Basic overview and discussion of pathophysiology of Heart Failure reviewed   Provided verbal education on low sodium diet, per the patient wife he is doing better with dietary restrictions  Discussed importance of daily weight and advised patient to weigh and record daily: patient is now weighing daily and recording, weight this am was 196 (-5 pound weight loss since ER visit  last week)  Reviewed role of diuretics in prevention of fluid overload and management of heart failure  Discussed s/s of HF and increased fluid. Patient verbalized that he is feeling much better and went today to have a "test".  (ECHO scheduled for today)  Patient has been followed closely by cardiology and recently had studies by pulmonology to determine underlying cause of increased SOB.   Patient reports using 02 at night successfully. The patient expressed he usually wakes up in the middle of the night and the oxygen has dried his nose out. He usually takes it off then and goes back to sleep without incident  Discussed the importance of [place] and  the patient states PT and a nurse are coming into the home to check on him regularly. The patient feels he is improving    Wt Readings from Last 3 Encounters:  04/22/19 196 lb (88.9 kg)  04/19/19 201 lb 8 oz (91.4 kg)  04/15/19 201 lb 11.2 oz (91.5 kg)    BP Readings from Last 3 Encounters:  04/21/19 (!) 150/82  04/19/19 (!) 160/97  04/15/19 117/74       Patient Self Care Activities:   Takes Heart Failure Medications as prescribed  Weighs daily and record (notifying MD of 3 lb weight gain over night or 5 lb in a week)  Verbalizes understanding of and follows CHF Action Plan  Adheres to low sodium diet  Please see past updates related to this goal by clicking on the "Past Updates" button in the selected goal       RNCM: We doing better at watching his blood pressure and sugars       Current Barriers:   Chronic Disease Management support, education, and care coordination needs related to CHF, HTN, DMII, and increased shortness of breath with recent hospitalization and ER visit  Clinical Goal(s) related to CHF, HTN, DMII, and increased shortness of breath with recent hospitalization and ER visit :  Over the next 90 days, patient will:   Work with the care management team to address educational, disease management, and care  coordination needs   Begin or continue self health monitoring activities as directed today Measure and record cbg (blood glucose) 1 times daily, Measure and record blood pressure 7 times per week, and Measure and record weight daily  Call provider office for new or worsened signs and symptoms Blood glucose findings outside established parameters, Blood pressure findings outside established parameters, Weight outside established parameters, Oxygen saturation lower than established parameter, Shortness of breath, and New or worsened symptom related to Chronic health conditions  Call care management team with questions or concerns  Verbalize basic understanding of patient centered plan of care established today  Interventions related to CHF, HTN, DMII, and increased shortness of breath with recent hospitalization and ER visit:   Evaluation of current treatment plans and patient's adherence to plan as established by provider  Assessed patient understanding of disease states  Assessed patient's education and care coordination needs  Provided disease specific education to patient   Collaborated with appropriate clinical care team members regarding patient needs  Patient Self Care Activities related to CHF, HTN, DMII, and increased shortness of breath with recent hospitalization and ER visit:   Patient is unable to independently self-manage chronic health conditions  Initial goal documentation      RNCM: We need to know where to get the COVID vaccine       Current Barriers:   Knowledge Deficits related to COVID-19 and impact on patient self health management  Clinical Goal(s):   Over the next 30 days, patient will verbalize basic understanding of COVID-19 impact on individual health and self health management as evidenced by verbalization of basic understanding of COVID-19 as a viral disease, measures to prevent exposure, signs and symptoms, when to contact  provider  Interventions:  Advised patient to call the number:  [phone] if they have not heard from scheduling to inquire about status of the vaccine and when they can anticipate getting the vaccine  Provided patient and/or caregiver with COVID19  information about where in [facility] the Vaccine can be obtained  [employer]).  Assisted by placing the  patient on the [email or web address] for call when COVID 19 Vaccine is available   Reviewed scheduled/upcoming provider appointments including: Next appointment with pcp on 06/04/2019  Patient Self Care Activities:   Patient verbalizes understanding of plan to receive COVID19 vaccine in the next 30-60 days by getting a phone call when an appointment is available   Attends all scheduled provider appointments  Calls provider office for new concerns or questions  Initial goal documentation  For information about COVID-19 or "Corona Virus", the following web resources may be helpful:  CDC: BeginnerSteps.be    [place]:  [email or web address].[email or web address]                        Plan:   Telephone follow up appointment with care management team member scheduled for:06/10/2019 at 1 pm   [place], MSN, [place] Mobile: [phone]

## 2019-04-22 NOTE — Patient Instructions (Signed)
Visit Information  Goals Addressed            This Visit's Progress   . RN- I am getting more short winded but I feel better now (pt-stated)       Current Barriers:  [name] Knowledge deficit related to basic heart failure pathophysiology and self care management   Case Manager Clinical Goal(s):  [name] Over the next 60 days, patient will verbalize understanding of Heart Failure Action Plan and when to call doctor . Over the next 60 days, patient will take all Heart Failure mediations as prescribed . Over the next 60 days, patient will weigh daily and record (notifying MD of 3 lb weight gain over night or 5 lb in a week) . Over the next 60 days, patient will take blood pressure daily and record, notifying the provider for blood pressure outside of set parameters  Interventions:  . Basic overview and discussion of pathophysiology of Heart Failure reviewed  . Provided verbal education on low sodium diet, per the patient wife he is doing better with dietary restrictions . Discussed importance of daily weight and advised patient to weigh and record daily: patient is now weighing daily and recording, weight this am was 196 (-5 pound weight loss since ER visit last week) . Reviewed role of diuretics in prevention of fluid overload and management of heart failure . Discussed s/s of HF and increased fluid. Patient verbalized that he is feeling much better and went today to have a "test".  (ECHO scheduled for today) . Patient has been followed closely by cardiology and recently had studies by pulmonology to determine underlying cause of increased SOB.  . Patient reports using 02 at night successfully. The patient expressed he usually wakes up in the middle of the night and the oxygen has dried his nose out. He usually takes it off then and goes back to sleep without incident . Discussed the importance of [place] and the patient states PT and a nurse are coming into the home to check on him regularly. The patient  feels he is improving    Wt Readings from Last 3 Encounters:  04/22/19 196 lb (88.9 kg)  04/19/19 201 lb 8 oz (91.4 kg)  04/15/19 201 lb 11.2 oz (91.5 kg)    BP Readings from Last 3 Encounters:  04/21/19 (!) 150/82  04/19/19 (!) 160/97  04/15/19 117/74       Patient Self Care Activities:  . Takes Heart Failure Medications as prescribed . Weighs daily and record (notifying MD of 3 lb weight gain over night or 5 lb in a week) . Verbalizes understanding of and follows CHF Action Plan . Adheres to low sodium diet  Please see past updates related to this goal by clicking on the "Past Updates" button in the selected goal      . RNCM: We doing better at watching his blood pressure and sugars       Current Barriers:  . Chronic Disease Management support, education, and care coordination needs related to CHF, HTN, DMII, and increased shortness of breath with recent hospitalization and ER visit  Clinical Goal(s) related to CHF, HTN, DMII, and increased shortness of breath with recent hospitalization and ER visit :  Over the next 90 days, patient will:  . Work with the care management team to address educational, disease management, and care coordination needs  . Begin or continue self health monitoring activities as directed today Measure and record cbg (blood glucose) 1 times daily, Measure  and record blood pressure 7 times per week, and Measure and record weight daily . Call provider office for new or worsened signs and symptoms Blood glucose findings outside established parameters, Blood pressure findings outside established parameters, Weight outside established parameters, Oxygen saturation lower than established parameter, Shortness of breath, and New or worsened symptom related to Chronic health conditions . Call care management team with questions or concerns . Verbalize basic understanding of patient centered plan of care established today  Interventions related to CHF, HTN, DMII,  and increased shortness of breath with recent hospitalization and ER visit:  . Evaluation of current treatment plans and patient's adherence to plan as established by provider . Assessed patient understanding of disease states . Assessed patient's education and care coordination needs . Provided disease specific education to patient  . Collaborated with appropriate clinical care team members regarding patient needs  Patient Self Care Activities related to CHF, HTN, DMII, and increased shortness of breath with recent hospitalization and ER visit:  . Patient is unable to independently self-manage chronic health conditions  Initial goal documentation     . RNCM: We need to know where to get the COVID vaccine       Current Barriers:  [name] Knowledge Deficits related to COVID-19 and impact on patient self health management  Clinical Goal(s):  [name] Over the next 30 days, patient will verbalize basic understanding of COVID-19 impact on individual health and self health management as evidenced by verbalization of basic understanding of COVID-19 as a viral disease, measures to prevent exposure, signs and symptoms, when to contact provider  Interventions: . Advised patient to call the number:  [phone] if they have not heard from scheduling to inquire about status of the vaccine and when they can anticipate getting the vaccine . Provided patient and/or caregiver with COVID19  information about where in [facility] the Vaccine can be obtained  Advice worker). . Assisted by placing the patient on the [email or web address] for call when COVID 19 Vaccine is available  . Reviewed scheduled/upcoming provider appointments including: Next appointment with pcp on 06/04/2019  Patient Self Care Activities:  . Patient verbalizes understanding of plan to receive COVID19 vaccine in the next 30-60 days by getting a phone call when an appointment is available  . Attends all scheduled provider  appointments . Calls provider office for new concerns or questions  Initial goal documentation  For information about COVID-19 or "Corona Virus", the following web resources may be helpful:  CDC: BeginnerSteps.be    [place]:  [email or web address]                       The patient verbalized understanding of instructions provided today and declined a print copy of patient instruction materials.   Telephone follow up appointment with care management team member scheduled for: 06-10-2019 at 1 pm  Noreene Larsson RN, MSN, [place] Mobile: [phone]

## 2019-04-23 ENCOUNTER — Telehealth: Payer: Medicare HMO

## 2019-04-23 ENCOUNTER — Other Ambulatory Visit: Payer: Self-pay | Admitting: Pharmacy Technician

## 2019-04-23 DIAGNOSIS — I5042 Chronic combined systolic (congestive) and diastolic (congestive) heart failure: Secondary | ICD-10-CM | POA: Diagnosis not present

## 2019-04-23 DIAGNOSIS — E1140 Type 2 diabetes mellitus with diabetic neuropathy, unspecified: Secondary | ICD-10-CM | POA: Diagnosis not present

## 2019-04-23 DIAGNOSIS — I255 Ischemic cardiomyopathy: Secondary | ICD-10-CM | POA: Diagnosis not present

## 2019-04-23 DIAGNOSIS — J432 Centrilobular emphysema: Secondary | ICD-10-CM | POA: Diagnosis not present

## 2019-04-23 DIAGNOSIS — E1122 Type 2 diabetes mellitus with diabetic chronic kidney disease: Secondary | ICD-10-CM | POA: Diagnosis not present

## 2019-04-23 DIAGNOSIS — I2510 Atherosclerotic heart disease of native coronary artery without angina pectoris: Secondary | ICD-10-CM | POA: Diagnosis not present

## 2019-04-23 DIAGNOSIS — I130 Hypertensive heart and chronic kidney disease with heart failure and stage 1 through stage 4 chronic kidney disease, or unspecified chronic kidney disease: Secondary | ICD-10-CM | POA: Diagnosis not present

## 2019-04-23 DIAGNOSIS — I480 Paroxysmal atrial fibrillation: Secondary | ICD-10-CM | POA: Diagnosis not present

## 2019-04-23 DIAGNOSIS — N184 Chronic kidney disease, stage 4 (severe): Secondary | ICD-10-CM | POA: Diagnosis not present

## 2019-04-23 NOTE — Patient Outreach (Signed)
[facility]) Care Management  04/23/2019  OCEAN SCHILDT Feb 25, 1934 [id number]  Care coordination call placed to [place] in regards to patient's application for [place] and [place].  Spoke to [place] and called back and spoke to [place]. They both informed that the proof of income was invalid and needed to be updated to 2021. Informed them that the application was submitted in December 2020 and that the income should still be valid. Informed them that the previous representatives were able to locate that the income was initially submitted on 03/16/2019 but was missed by the processing team. They both confirmed with supervisor that the income would be acceptable. They both informed that they would send this for processing today.  Will route note to embedded [facility] [name] to inquire about an updated proof of income at next scheduled call just in case it is needed.  Will follow up with [place] in 3-7 business days to inquire if a determination has been made.  Lataja Newland P. Markell Sciascia, [place] Management [phone]

## 2019-04-26 ENCOUNTER — Telehealth: Payer: Self-pay | Admitting: Internal Medicine

## 2019-04-26 DIAGNOSIS — I5043 Acute on chronic combined systolic (congestive) and diastolic (congestive) heart failure: Secondary | ICD-10-CM

## 2019-04-26 NOTE — Addendum Note (Signed)
Addended byAlvis Lemmings C on: 04/26/2019 01:00 PM   Modules accepted: Orders

## 2019-04-26 NOTE — Telephone Encounter (Addendum)
I spoke with Mrs. Sweetland regarding the patient's echo results and Dr. Darnelle Bos recommendations. She voices understanding and is agreeable.  They will keep his appt as scheduled on Wed 2/10 with Dr. Saunders Revel.   Mrs. Pitcock is agreeable with a referral to the [facility]. I advised I would go ahead and place this referral.

## 2019-04-26 NOTE — Telephone Encounter (Signed)
I spoke with Mrs. [place] (ok per DPR).  I advised her the patient's echo report has not been signed off by Dr. Saunders Revel at this time.  She is aware we will call back once MD reviews/ has recommendations.  Per Mrs. Griffie, the patient's SOB seems worse than last week.  She cannot visibly tell that the patient is swelling in his lower extremities/ belly. He denies chest pain.   She is aware I will notify Dr. Saunders Revel that his SOB seems to be a little bit worse. He gets extremely winded just with her helping him put on his socks/ shoes.

## 2019-04-26 NOTE — Telephone Encounter (Signed)
Patient wife would like echo results.

## 2019-04-26 NOTE — Telephone Encounter (Signed)
Please let Lucas Caldwell know that his echo shows that his heart remains weakened, similar to pre-cath (I would not expect to see a dramatic change this quickly after stent placement).  There are no significant valve problems to explain his shortness of breath.  I suspect that some degree of fluid retention is contributing and possibly side effects from the ticagrelor.  This can sometimes cause shortness of breath, though I do not recall this being a problem for Lucas Caldwell in the past.  I recommend that he continue his current medications.  We will follow-up as planned later this week.  We should also start working on a referral to see [name] or Dr. Aundra Dubin in the advanced heart failure clinic in [place].  [name], MD [facility]

## 2019-04-27 ENCOUNTER — Ambulatory Visit: Payer: Medicare HMO | Admitting: Pharmacist

## 2019-04-27 ENCOUNTER — Telehealth: Payer: Self-pay

## 2019-04-27 ENCOUNTER — Other Ambulatory Visit: Payer: Self-pay | Admitting: Pharmacy Technician

## 2019-04-27 DIAGNOSIS — E1142 Type 2 diabetes mellitus with diabetic polyneuropathy: Secondary | ICD-10-CM

## 2019-04-27 DIAGNOSIS — I10 Essential (primary) hypertension: Secondary | ICD-10-CM

## 2019-04-27 DIAGNOSIS — I5032 Chronic diastolic (congestive) heart failure: Secondary | ICD-10-CM

## 2019-04-27 DIAGNOSIS — Z599 Problem related to housing and economic circumstances, unspecified: Secondary | ICD-10-CM

## 2019-04-27 DIAGNOSIS — Z794 Long term (current) use of insulin: Secondary | ICD-10-CM | POA: Diagnosis not present

## 2019-04-27 DIAGNOSIS — E1121 Type 2 diabetes mellitus with diabetic nephropathy: Secondary | ICD-10-CM | POA: Diagnosis not present

## 2019-04-27 DIAGNOSIS — Z598 Other problems related to housing and economic circumstances: Secondary | ICD-10-CM

## 2019-04-27 MED ORDER — ACCU-CHEK SOFTCLIX LANCETS MISC: 3 refills | Status: DC

## 2019-04-27 NOTE — Chronic Care Management (AMB) (Signed)
Chronic Care Management   Follow Up Note   04/27/2019 Name: Lucas Caldwell MRN: [id number] DOB: 06-12-1933  Referred by: Lucas Hauser, DO Reason for referral : Chronic Care Management (Patient Phone Call)   Lucas Caldwell is a 86 y.o. year old male who is a primary care patient of Lucas Hauser, DO. The CCM team was consulted for assistance with chronic disease management and care coordination needs.    I reached out to Lucas Caldwell by phone today. Lucas Caldwell has a past medical history including but not limited to type 2 diabetes, CKD, hyperlipidemia, COPD, atrial fibrillation, hypertension, coronary artery disease, Chronic HFpEF, hypothyroidism, and CML  I reached out to Lucas Caldwell and his wife by phone today.   Coordination of care call to [employer].  Review of patient status, including review of consultants reports, relevant laboratory and other test results, and collaboration with appropriate care team members and the patient's provider was performed as part of comprehensive patient evaluation and provision of chronic care management services.   .   Outpatient Encounter Medications as of 04/27/2019  Medication Sig Note  . Dulaglutide (TRULICITY) 5.17 GY/1.7CB SOPN Inject 0.75 mg into the skin once a week. (Patient taking differently: Inject 0.75 mg into the skin every Saturday. )   . Insulin Glargine ([name] KWIKPEN) 100 UNIT/ML SOPN Inject 38 Units into the skin daily.    [name] ACCU-CHEK AVIVA PLUS test strip Use to check blood sugar up to 2 times daily   . Accu-Chek Softclix Lancets lancets Use to check blood sugar up to 2 times daily.   [name] acetaminophen (TYLENOL) 500 MG tablet Take 1,000 mg by mouth every 6 (six) hours as needed for moderate pain or headache.    . Alcohol Swabs 70 % PADS Use when checking blood sugar up to 3x daily   . allopurinol (ZYLOPRIM) 100 MG tablet TAKE ONE TABLET BY MOUTH ONCE DAILY (Patient taking differently: Take 100 mg  by mouth 2 (two) times daily. )   . aspirin 81 MG chewable tablet Chew 1 tablet (81 mg total) by mouth daily.   . Blood Glucose Monitoring Suppl (ACCU-CHEK AVIVA PLUS) w/Device KIT Use to check blood sugar up to 2 times daily   . bosutinib (BOSULIF) 100 MG tablet Take 100 mg by mouth at bedtime as needed (tired/not feeling well). Take with food.  08/31/2018: Pt's wife states he only takes this as needed when he's "not feeling well"  . gabapentin (NEURONTIN) 300 MG capsule Take 2 capsules (600 mg total) by mouth 2 (two) times daily. Max dose = 1260m daily for kidney function (Patient taking differently: Take 600 mg by mouth 2 (two) times daily. )   . hydrALAZINE (APRESOLINE) 25 MG tablet Take 1 tablet (25 mg total) by mouth 3 (three) times daily.   . Insulin Pen Needle 31G X 5 MM MISC 31 g by Does not apply route as directed.   . isosorbide mononitrate (IMDUR) 60 MG 24 hr tablet TAKE 1 AND 1/2 TABLETS BY MOUTH ONCE DAILY (Patient taking differently: Take 90 mg by mouth daily. )   . levothyroxine (SYNTHROID) 25 MCG tablet TAKE 1 TABLET (25 MCG TOTAL) BY MOUTH DAILY BEFORE BREAKFAST.   .Marland Kitchenloperamide (IMODIUM A-D) 2 MG tablet Take 2 mg by mouth 3 (three) times daily as needed for diarrhea or loose stools.    . Menthol-Zinc Oxide (GOLD BOND EX) Apply 1 application topically daily as needed (muscle pain).   .[name]  metoprolol succinate (TOPROL-XL) 50 MG 24 hr tablet Take 1 tablet (50 mg total) by mouth daily.   . Multiple Vitamins-Minerals (MULTIVITAMIN ADULTS 50+ PO) Take 1 tablet by mouth every morning.   . nitroGLYCERIN (NITROSTAT) 0.4 MG SL tablet Place 1 tablet (0.4 mg total) under the tongue every 5 (five) minutes x 3 doses as needed for chest pain.   [name] omeprazole (PRILOSEC) 20 MG capsule Take 20 mg by mouth 2 (two) times daily.   [name] Place 1 drop into both eyes daily as needed (dry eyes).   . polyethylene glycol (MIRALAX / GLYCOLAX) 17 g packet Take 17 g by mouth  daily as needed for mild constipation.    [name] PROVENTIL HFA 108 (90 Base) MCG/ACT inhaler Inhale 2 puffs into the lungs every 6 (six) hours as needed for wheezing or shortness of breath.   . ranolazine (RANEXA) 500 MG 12 hr tablet TAKE ONE TABLET BY MOUTH TWICE DAILY (Patient taking differently: Take 500 mg by mouth 2 (two) times daily. )   . rosuvastatin (CRESTOR) 5 MG tablet Take 1 tablet (5 mg total) by mouth daily. (Patient taking differently: Take 5 mg by mouth at bedtime. )   . senna (SENOKOT) 8.6 MG tablet Take 1 tablet by mouth daily as needed for constipation.   . ticagrelor (BRILINTA) 90 MG TABS tablet Take 1 tablet (90 mg total) by mouth 2 (two) times daily.   [name] torsemide (DEMADEX) 20 MG tablet 2 tabs every morning; 1 tab every PM   . traMADol (ULTRAM) 50 MG tablet Take 1 tablet (50 mg total) by mouth every 8 (eight) hours as needed for moderate pain. (Patient not taking: Reported on 04/06/2019)    No facility-administered encounter medications on file as of 04/27/2019.    Goals Addressed            This Visit's Progress   . PharmD - Medication Managment       Current Barriers:  . Financial o Note patient has Partial Extra Help/LIS Subsidy . Limited vision . Chronic Disease Management support and education needs related to multiple chronic disease processes  Pharmacist Clinical Goal(s):  [name] Over the next 30 days, patient will work with CM Pharmacist to address needs related to medication adherence and diabetes management  Interventions: . Receive message from Pey[place] Simcox. Reports patient's application for [id number] re-enrollment in [place] patient assistance program for Trulicity and Nancee Liter is still in processing, not yet approved.  . Lucas Caldwell reports that [name] has been "short-winded" recently, but that she has contacted patient's Cardiologist and patient has a follow up appointment with Cardiology tomorrow, 2/10 ? Perform chart review and note that Cardiologist  placed referral for patient to [facility]. . Follow up with wife/patient regarding blood sugar monitoring and control ? Reports patient currently taking: ? [name] 38 untis daily ? Trulicity 9.15 mg once weekly ? Review recent CBG results (see below) ? Denies any recent low blood sugar results of symptoms . Follow up with Mrs. Judice regarding medication assistance. Mrs. Mattern reports patient still has plenty of [name] remaining, but only has 1 pen of Trulicity 0.56 mg remaining, to use on 2/13. [name] Place call to [employer] patient assistance program. Speak with representative Mardene Celeste. Request escalation of application review as patient is low on medication supply. Mardene Celeste states that she will escalate status of application and have supervisor call me back. [name] Collaborate with office regarding availability of  samples for patient. 1 box (2 pens) of Trulicity 1.02 mg sample is available for patient. . Follow up call to Mrs. Kuenzi to let her know that she can pick up this sample for patient from office tomorrow during business hours.  Patient Self Care Activities:  . Check blood sugar, blood pressure and daily weight as directed by providers  Date Fasting Blood Glucose  3 - February 133  4 - February -  5 - February -  6 - February 168  7 - February 153  8 - February 158  9 - February 160  Average 154   . Takes medications, with assistance of wife, as directed . Patient attends scheduled medical appointments  Please see past updates related to this goal by clicking on the "Past Updates" button in the selected goal         Plan  The care management team will reach out to the patient again over the next 30 days.   [name], PharmD, [place] [employer] [phone]

## 2019-04-27 NOTE — Patient Outreach (Signed)
[facility] Care Management  04/27/2019  DERRIC DEALMEIDA April 09, 1933 [id number]  Care coordination call placed to [place] in regards to patient's application for Trulicity and [name].  Spoke to [place] who informed the application is still processing. She informed the application was just sent to processing on 11/17/7637 despite application having been submitted on 03/16/2019.  Will follow up with [employer] in 7-10 business days.  Alver Leete P. Vandy Fong, [facility] [phone]

## 2019-04-27 NOTE — Patient Instructions (Signed)
Thank you allowing the Chronic Care Management Team to be a part of your care! It was a pleasure speaking with you today!     CCM (Chronic Care Management) Team    Noreene Larsson RN, MSN, CCM Nurse Care Coordinator  [phone]   [name] PharmD  Clinical Pharmacist  [phone]   Eula Fried LCSW Clinical Social Worker [phone]  Visit Information  Goals Addressed            This Visit's Progress   . PharmD - Medication Managment       Current Barriers:  . Financial o Note patient has Partial Extra Help/LIS Subsidy . Limited vision . Chronic Disease Management support and education needs related to multiple chronic disease processes  Pharmacist Clinical Goal(s):  [name] Over the next 30 days, patient will work with CM Pharmacist to address needs related to medication adherence and diabetes management  Interventions: . Receive message from [place] Simcox. Reports patient's application for [id number] re-enrollment in [place] patient assistance program for Trulicity and Nancee Liter is still in processing, not yet approved.  . Mrs. Hepp reports that Mr. Ishman has been "short-winded" recently, but that she has contacted patient's Cardiologist and patient has a follow up appointment with Cardiology tomorrow, 2/10 ? Perform chart review and note that Cardiologist placed referral for patient to [facility]. . Follow up with wife/patient regarding blood sugar monitoring and control ? Reports patient currently taking: ? [name] 38 untis daily ? Trulicity 7.62 mg once weekly ? Review recent CBG results (see below) ? Denies any recent low blood sugar results of symptoms . Follow up with Mrs. Rahm regarding medication assistance. Mrs. Borgmeyer reports patient still has plenty of [name] remaining, but only has 1 pen of Trulicity 8.31 mg remaining, to use on 2/13. [name] Place call to [employer] patient assistance program. Speak with representative Mardene Celeste.  Request escalation of application review as patient is low on medication supply. Mardene Celeste states that she will escalate status of application and have supervisor call me back. [name] Collaborate with office regarding availability of samples for patient. 1 box (2 pens) of Trulicity 5.17 mg sample is available for patient. . Follow up call to Mrs. Hepworth to let her know that she can pick up this sample for patient from office tomorrow during business hours.  Patient Self Care Activities:  . Check blood sugar, blood pressure and daily weight as directed by providers  Date Fasting Blood Glucose  3 - February 133  4 - February -  5 - February -  6 - February 168  7 - February 153  8 - February 158  9 - February 160  Average 154   . Takes medications, with assistance of wife, as directed . Patient attends scheduled medical appointments  Please see past updates related to this goal by clicking on the "Past Updates" button in the selected goal         The patient verbalized understanding of instructions provided today and declined a print copy of patient instruction materials.   The care management team will reach out to the patient again over the next 30 days.   [name], PharmD, [place] [employer] [phone]

## 2019-04-28 ENCOUNTER — Other Ambulatory Visit
Admission: RE | Admit: 2019-04-28 | Discharge: 2019-04-28 | Disposition: A | Discharge status: Home / Self Care | Payer: Medicare HMO | Source: Ambulatory Visit | Attending: Internal Medicine | Admitting: Internal Medicine

## 2019-04-28 ENCOUNTER — Other Ambulatory Visit: Payer: Self-pay

## 2019-04-28 ENCOUNTER — Ambulatory Visit (INDEPENDENT_AMBULATORY_CARE_PROVIDER_SITE_OTHER): Payer: Medicare HMO | Admitting: Internal Medicine

## 2019-04-28 ENCOUNTER — Encounter: Payer: Self-pay | Admitting: Internal Medicine

## 2019-04-28 VITALS — BP 140/80 | HR 96 | Ht 64.0 in | Wt 206.8 lb

## 2019-04-28 DIAGNOSIS — I10 Essential (primary) hypertension: Secondary | ICD-10-CM

## 2019-04-28 DIAGNOSIS — I255 Ischemic cardiomyopathy: Secondary | ICD-10-CM | POA: Insufficient documentation

## 2019-04-28 DIAGNOSIS — I2510 Atherosclerotic heart disease of native coronary artery without angina pectoris: Secondary | ICD-10-CM | POA: Diagnosis not present

## 2019-04-28 DIAGNOSIS — I25118 Atherosclerotic heart disease of native coronary artery with other forms of angina pectoris: Secondary | ICD-10-CM

## 2019-04-28 DIAGNOSIS — I480 Paroxysmal atrial fibrillation: Secondary | ICD-10-CM

## 2019-04-28 DIAGNOSIS — N184 Chronic kidney disease, stage 4 (severe): Secondary | ICD-10-CM

## 2019-04-28 DIAGNOSIS — E1122 Type 2 diabetes mellitus with diabetic chronic kidney disease: Secondary | ICD-10-CM | POA: Diagnosis not present

## 2019-04-28 DIAGNOSIS — N320 Bladder-neck obstruction: Secondary | ICD-10-CM | POA: Diagnosis not present

## 2019-04-28 DIAGNOSIS — E785 Hyperlipidemia, unspecified: Secondary | ICD-10-CM | POA: Diagnosis not present

## 2019-04-28 DIAGNOSIS — I5023 Acute on chronic systolic (congestive) heart failure: Secondary | ICD-10-CM

## 2019-04-28 DIAGNOSIS — E1140 Type 2 diabetes mellitus with diabetic neuropathy, unspecified: Secondary | ICD-10-CM | POA: Diagnosis not present

## 2019-04-28 DIAGNOSIS — I5042 Chronic combined systolic (congestive) and diastolic (congestive) heart failure: Secondary | ICD-10-CM | POA: Diagnosis not present

## 2019-04-28 DIAGNOSIS — J432 Centrilobular emphysema: Secondary | ICD-10-CM | POA: Diagnosis not present

## 2019-04-28 DIAGNOSIS — I130 Hypertensive heart and chronic kidney disease with heart failure and stage 1 through stage 4 chronic kidney disease, or unspecified chronic kidney disease: Secondary | ICD-10-CM | POA: Diagnosis not present

## 2019-04-28 LAB — BASIC METABOLIC PANEL
Anion gap: 12 (ref 5–15)
BUN: 20 mg/dL (ref 8–23)
CO2: 26 mmol/L (ref 22–32)
Calcium: 9.5 mg/dL (ref 8.9–10.3)
Chloride: 98 mmol/L (ref 98–111)
Creatinine, Ser: 1.47 mg/dL — ABNORMAL HIGH (ref 0.61–1.24)
GFR calc Af Amer: 50 mL/min — ABNORMAL LOW (ref >60)
GFR calc non Af Amer: 43 mL/min — ABNORMAL LOW (ref >60)
Glucose, Bld: 186 mg/dL — ABNORMAL HIGH (ref 70–99)
Potassium: 3.5 mmol/L (ref 3.5–5.1)
Sodium: 136 mmol/L (ref 135–145)

## 2019-04-28 LAB — CBC WITH DIFFERENTIAL/PLATELET
Abs Immature Granulocytes: 0.03 10*3/uL (ref 0.00–0.07)
Basophils Absolute: 0.1 10*3/uL (ref 0.0–0.1)
Basophils Relative: 1 %
Eosinophils Absolute: 0.2 10*3/uL (ref 0.0–0.5)
Eosinophils Relative: 3 %
HCT: 39.3 % (ref 39.0–52.0)
Hemoglobin: 12.7 g/dL — ABNORMAL LOW (ref 13.0–17.0)
Immature Granulocytes: 0 %
Lymphocytes Relative: 22 %
Lymphs Abs: 1.7 10*3/uL (ref 0.7–4.0)
MCH: 27.9 pg (ref 26.0–34.0)
MCHC: 32.3 g/dL (ref 30.0–36.0)
MCV: 86.4 fL (ref 80.0–100.0)
Monocytes Absolute: 0.8 10*3/uL (ref 0.1–1.0)
Monocytes Relative: 11 %
Neutro Abs: 4.8 10*3/uL (ref 1.7–7.7)
Neutrophils Relative %: 63 %
Platelets: 212 10*3/uL (ref 150–400)
RBC: 4.55 MIL/uL (ref 4.22–5.81)
RDW: 14.8 % (ref 11.5–15.5)
WBC: 7.6 10*3/uL (ref 4.0–10.5)
nRBC: 0 % (ref 0.0–0.2)

## 2019-04-28 NOTE — Progress Notes (Signed)
Follow-up Outpatient Visit Date: 04/28/2019  Primary Care Provider: [name], DO [address] [id number]  Chief Complaint: Shortness of breath  HPI:  Lucas Caldwell is a 86 y.o. 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  I last saw Lucas Caldwell in late 02/2019, at which time he reported worsening exertional dyspnea.  We attempted to escalate his diuresis, though he did not have significant improvement in his shortness of breath.  He was subsequently seen by Lucas Montana, NP, and referred for right and left heart catheterization.  This revealed stable multivessel CAD, with the most severe lesion being diffuse proximal and mid LAD disease that was previously shown to be hemodynamically significant by IFR.  He was also found to have moderately elevated left heart and pulmonary artery pressures.  We elected to intervene on the most significant mid LAD stenosis and treat the remaining disease medically.  He was kept in the hospital for 3 additional days for further diuresis and medical optimization.  He reported feeling better at the time of discharge but subsequently contacted our office a few days later with worsening shortness of breath.  He was referred to the ER, where work-up was unrevealing, without evidence of MI or PE.  Subsequent echo showed LVEF of 30-35%.  Today, Lucas Caldwell reports that he is still significantly short of breath.  It is about the same as when he presented to the ED a week ago.  He feels like he is also swollen in his abdomen and wonders if he could be retaining urine.  Immediately after  his catheterization, he had urinary retention and felt significantly better after Foley catheter was placed.  He reports that he is not making as much urine as he is accustomed to with torsemide.  He denies chest pain, palpitations, and lightheadedness.  His weight has been stable, as has his lower extremity edema.  [name] compliant with his medications, including aspirin and ticagrelor.  He does not feel like his breathing worsens significantly after taking ticagrelor.  --------------------------------------------------------------------------------------------------  Past Medical History:  Diagnosis Date  . Arthritis    "probably in his legs" (08/15/2016)  . Blind right eye   . BPH (benign prostatic hyperplasia)   . Breast asymmetry    Left breast is larger, present for several years.  [name] CAD (coronary artery disease)    a. Cath in the late 90's - reportedly ok;  b. 2014 s/p stenting x 2 @ [facility]; c 08/19/16 Cath/PCI with DES -> RCA, plan to treat LM 70% medically. Seen by surgery and felt to be too high risk for CABG; d. 08/2018 Cath: LM 70 (iFR 0.86), LAD 20ost, 40p, 50/69m D1 70, LCX patent stent, OM1 30, RCA patent stent, 411mSR, 40d, RPDA 30. PCWP 8. CO/CI 4.0/2.1.  . Marland Kitchenhronic combined systolic (congestive) and diastolic (congestive) heart failure (HCEldred   a. Previously reduced EF-->50% by echo in 2012;  b. 06/2015 Echo: EF 50-55%  c. 07/2016 Echo: EF 45-50%; d. 12/2017 Echo: EF 55%, Gr1 DD, mild MR; e. 08/2018 Echo: EF 35-40%, mildly dil PA.  . Marland Kitchenhronic kidney disease (CKD), stage IV (severe) ([facility] [place]  . CML (chronic myelocytic leukemia) ([facility]  .  GERD (gastroesophageal reflux disease)   . GIB (gastrointestinal bleeding)    a. 12/2017 3 unit PRBC GIB in setting of coumadin-->Endo/colon multiple duodenal ulcers and a single bleeding ulcer in the proximal ascending colon status post hemostatic clipping x2.  . Gout   . Hyperlipemia   . Hypertension   . Hypothyroidism   . Iron deficiency anemia   .  Ischemic cardiomyopathy    a. Previously reduced EF-->50% by echo in 2012;  b. 06/2015 Echo: EF 50-55%, Gr2 DD, mild MR, mildly dil LA, Ao sclerosis, mild TR;  c. 07/2016 Echo: EF 45-50%; d. 12/2017 Echo: EF 55%, Gr1 DD, mild MR; e. 08/2018 Echo: EF 35-40%.  . Migraine    "in the 1960s" (08/15/2016)  . PAF (paroxysmal atrial fibrillation) (HCC)    a. ? Dx 2014-->s/p DCCV;  b. CHA2DS2VASc = 6--> Coumadin.  . Prostate cancer ([place])   . RBBB   . Type II diabetes mellitus ([place])    Past Surgical History:  Procedure Laterality Date  . CATARACT EXTRACTION W/ INTRAOCULAR LENS  IMPLANT, BILATERAL Bilateral   . COLONOSCOPY N/A 01/13/2018   Procedure: COLONOSCOPY;  Surgeon: Lin Landsman, MD;  Location: [facility] ENDOSCOPY;  Service: Gastroenterology;  Laterality: N/A;  . COLONOSCOPY WITH PROPOFOL N/A 01/01/2018   Procedure: COLONOSCOPY WITH PROPOFOL;  Surgeon: Lin Landsman, MD;  Location: [facility] ENDOSCOPY;  Service: Gastroenterology;  Laterality: N/A;  . CORONARY ANGIOPLASTY WITH STENT PLACEMENT  09/2012   2 stents  . CORONARY STENT INTERVENTION N/A 08/19/2016   Procedure: Coronary Stent Intervention;  Surgeon: [name], MD;  Location: [place] CV LAB;  Service: Cardiovascular;  Laterality: N/A;  . CORONARY STENT INTERVENTION N/A 04/13/2019   Procedure: CORONARY STENT INTERVENTION;  Surgeon: [name], MD;  Location: [place] CV LAB;  Service: Cardiovascular;  Laterality: N/A;  . ESOPHAGEAL MANOMETRY N/A 12/16/2017   Procedure: ESOPHAGEAL MANOMETRY (EM);  Surgeon: Lin Landsman, MD;  Location: [facility] ENDOSCOPY;  Service: Gastroenterology;  Laterality: N/A;  . ESOPHAGOGASTRODUODENOSCOPY N/A 12/20/2016   Procedure: ESOPHAGOGASTRODUODENOSCOPY (EGD);  Surgeon: Lin Landsman, MD;  Location: [facility] ENDOSCOPY;  Service: Gastroenterology;  Laterality: N/A;  . ESOPHAGOGASTRODUODENOSCOPY N/A 01/13/2018   Procedure: ESOPHAGOGASTRODUODENOSCOPY (EGD);  Surgeon: Lin Landsman,  MD;  Location: [facility] ENDOSCOPY;  Service: Gastroenterology;  Laterality: N/A;  . ESOPHAGOGASTRODUODENOSCOPY (EGD) WITH PROPOFOL N/A 11/03/2017   Procedure: ESOPHAGOGASTRODUODENOSCOPY (EGD) WITH PROPOFOL;  Surgeon: Lin Landsman, MD;  Location: [facility] ENDOSCOPY;  Service: Gastroenterology;  Laterality: N/A;  . EYE SURGERY    . HERNIA REPAIR     "navel"  . LEFT HEART CATH AND CORONARY ANGIOGRAPHY N/A 08/14/2016   Procedure: Left Heart Cath and Coronary Angiography;  Surgeon: [name], MD;  Location: [place] CV LAB;  Service: Cardiovascular;  Laterality: N/A;  . RIGHT/LEFT HEART CATH AND CORONARY ANGIOGRAPHY N/A 09/01/2018   Procedure: RIGHT/LEFT HEART CATH AND CORONARY ANGIOGRAPHY;  Surgeon: [name], MD;  Location: [place] CV LAB;  Service: Cardiovascular;  Laterality: N/A;  . RIGHT/LEFT HEART CATH AND CORONARY ANGIOGRAPHY N/A 04/13/2019   Procedure: RIGHT/LEFT HEART CATH AND CORONARY ANGIOGRAPHY;  Surgeon: [name], MD;  Location: [place] CV LAB;  Service: Cardiovascular;  Laterality: N/A;  . TOE AMPUTATION Right    "big toe"    Current Meds  Medication Sig  . ACCU-CHEK AVIVA PLUS test strip Use to check blood sugar up to 2 times daily  . Accu-Chek Softclix Lancets lancets Use to check blood sugar up to 2 times daily.  [name]  acetaminophen (TYLENOL) 500 MG tablet Take 1,000 mg by mouth every 6 (six) hours as needed for moderate pain or headache.   . Alcohol Swabs 70 % PADS Use when checking blood sugar up to 3x daily  . allopurinol (ZYLOPRIM) 100 MG tablet TAKE ONE TABLET BY MOUTH ONCE DAILY (Patient taking differently: Take 100 mg by mouth 2 (two) times daily. )  . aspirin 81 MG chewable tablet Chew 1 tablet (81 mg total) by mouth daily.  . Blood Glucose Monitoring Suppl (ACCU-CHEK AVIVA PLUS) w/Device KIT Use to check blood sugar up to 2 times daily  . bosutinib (BOSULIF) 100 MG tablet Take 100 mg by mouth at bedtime as needed (tired/not feeling well).  Take with food.   . Dulaglutide (TRULICITY) 3.09 MM/7.6KG SOPN Inject 0.75 mg into the skin once a week. (Patient taking differently: Inject 0.75 mg into the skin every Saturday. )  . gabapentin (NEURONTIN) 300 MG capsule Take 2 capsules (600 mg total) by mouth 2 (two) times daily. Max dose = 1247m daily for kidney function (Patient taking differently: Take 600 mg by mouth 2 (two) times daily. )  . Insulin Glargine ([name] KWIKPEN) 100 UNIT/ML SOPN Inject 38 Units into the skin daily.   . Insulin Pen Needle 31G X 5 MM MISC 31 g by Does not apply route as directed.  . isosorbide mononitrate (IMDUR) 60 MG 24 hr tablet TAKE 1 AND 1/2 TABLETS BY MOUTH ONCE DAILY (Patient taking differently: Take 90 mg by mouth daily. )  . levothyroxine (SYNTHROID) 25 MCG tablet TAKE 1 TABLET (25 MCG TOTAL) BY MOUTH DAILY BEFORE BREAKFAST.  .Marland Kitchenloperamide (IMODIUM A-D) 2 MG tablet Take 2 mg by mouth 3 (three) times daily as needed for diarrhea or loose stools.   . Menthol-Zinc Oxide (GOLD BOND EX) Apply 1 application topically daily as needed (muscle pain).  . metoprolol succinate (TOPROL-XL) 50 MG 24 hr tablet Take 1 tablet (50 mg total) by mouth daily.  . Multiple Vitamins-Minerals (MULTIVITAMIN ADULTS 50+ PO) Take 1 tablet by mouth every morning.  . nitroGLYCERIN (NITROSTAT) 0.4 MG SL tablet Place 1 tablet (0.4 mg total) under the tongue every 5 (five) minutes x 3 doses as needed for chest pain.  .Marland Kitchenomeprazole (PRILOSEC) 20 MG capsule Take 20 mg by mouth 2 (two) times daily.  .[name] (SYSTANE OP) Place 1 drop into both eyes daily as needed (dry eyes).  . polyethylene glycol (MIRALAX / GLYCOLAX) 17 g packet Take 17 g by mouth daily as needed for mild constipation.   .Marland KitchenPROVENTIL HFA 108 (90 Base) MCG/ACT inhaler Inhale 2 puffs into the lungs every 6 (six) hours as needed for wheezing or shortness of breath.  . ranolazine (RANEXA) 500 MG 12 hr tablet TAKE ONE TABLET BY MOUTH TWICE DAILY (Patient  taking differently: Take 500 mg by mouth 2 (two) times daily. )  . rosuvastatin (CRESTOR) 5 MG tablet Take 1 tablet (5 mg total) by mouth daily. (Patient taking differently: Take 5 mg by mouth at bedtime. )  . senna (SENOKOT) 8.6 MG tablet Take 1 tablet by mouth daily as needed for constipation.  . ticagrelor (BRILINTA) 90 MG TABS tablet Take 1 tablet (90 mg total) by mouth 2 (two) times daily.  . traMADol (ULTRAM) 50 MG tablet Take 1 tablet (50 mg total) by mouth every 8 (eight) hours as needed for moderate pain.  . [DISCONTINUED] hydrALAZINE (APRESOLINE) 25 MG tablet Take 1 tablet (25 mg total) by mouth 3 (three)  times daily.  . [DISCONTINUED] torsemide (DEMADEX) 20 MG tablet 2 tabs every morning; 1 tab every PM    Allergies: Clopidogrel, Ciprofloxacin, Mirtazapine, and Spironolactone  Social History   Tobacco Use  . Smoking status: Former Smoker    Packs/day: 2.00    Years: 15.00    Pack years: 30.00    Types: Cigarettes    Quit date: 1979    Years since quitting: 42.1  . Smokeless tobacco: Former Systems developer  . Tobacco comment: quit 1979  Substance Use Topics  . Alcohol use: No    Comment: previously drank heavily - quit 1979.  . Drug use: No    Family History  Problem Relation Age of Onset  . Brain cancer Father   . Diabetes Sister   . Heart attack Sister     Review of Systems: A 12-system review of systems was performed and was negative except as noted in the HPI.  --------------------------------------------------------------------------------------------------  Physical Exam: BP 140/80 (BP Location: Right Arm, Patient Position: Sitting, Cuff Size: Normal)   Pulse 96   Ht '5\' 4"'  (1.626 m)   Wt 206 lb 12 oz (93.8 kg)   SpO2 98%   BMI 35.49 kg/m   General: NAD.  Accompanied by his wife. HEENT: No conjunctival pallor or scleral icterus. Facemask in place. Neck: JVP approximately 8 to 10 cm with positive HJR. Lungs: Normal work of breathing.  Mildly diminished breath  sounds at the lung bases without wheezes or crackles. Heart: Regular rate and rhythm without murmurs, rubs, or gallops. Abd: Bowel sounds present.  Firm and mildly distended.  No tenderness. Ext: 1+ pretibial edema bilaterally.  EKG: Normal sinus rhythm with occasional PVCs and bifascicular block (RBBB and LAFB).  No significant change from prior tracings.  Lab Results  Component Value Date   WBC 7.6 04/28/2019   HGB 12.7 (L) 04/28/2019   HCT 39.3 04/28/2019   MCV 86.4 04/28/2019   PLT 212 04/28/2019    Lab Results  Component Value Date   NA 136 04/28/2019   K 3.5 04/28/2019   CL 98 04/28/2019   CO2 26 04/28/2019   BUN 20 04/28/2019   CREATININE 1.47 (H) 04/28/2019   GLUCOSE 186 (H) 04/28/2019   ALT 14 08/28/2018    Lab Results  Component Value Date   CHOL 110 08/28/2018   HDL 28 (L) 08/28/2018   LDLCALC 51 08/28/2018   TRIG 155 (H) 08/28/2018   CHOLHDL 3.9 08/28/2018    --------------------------------------------------------------------------------------------------  ASSESSMENT AND PLAN: Acute on chronic HFrEF: Unfortunately, Lucas Caldwell reports that his dyspnea has not improved since his ED visit.  He appears mildly volume overloaded on exam today though his weight is down slightly from prior visits.  He feels like he is distended in his abdomen, which may be where he is holding his fluid.  There is also concern that he could be having urinary retention.  We will check a basic metabolic panel today and arrange for renal ultrasound to soon as possible to exclude urinary retention.  Assuming labs and renal ultrasound are unremarkable, we will need to increase torsemide to 40 mg twice daily.  We will also plan to increase hydralazine to 50 mg 3 times daily to improve afterload reduction.  I will defer challenging him with an ACE inhibitor or ARB at this time pending reevaluation of his renal function in the setting of CKD stage III.  I have also referred Lucas Caldwell to the  advanced heart failure clinic in [place]  for further assistance with management of his worsening heart failure.  Coronary artery disease with stable angina: Lucas Caldwell does not report any angina.  I wonder if some of his dyspnea may be related to ticagrelor, though he previously tolerated this medication without significant side effects.  I would like to complete at least 3 months of aspirin and ticagrelor before we consider discontinuing ticagrelor and switching him back to warfarin and aspirin.  However, if we have no other explanation for his dyspnea, we may need to consider making this transition 1 month after stent placement.  I would like to avoid repeat catheterization, if possible.  Continue antianginal therapy consisting of metoprolol, isosorbide mononitrate, and ranolazine.  Hypertension: Blood pressure suboptimally controlled today.  I will check renal ultrasound and labs.  If these are unremarkable, I suspect we will escalate torsemide and also increase hydralazine to 50 mg 3 times daily.  Continue current doses of metoprolol, isosorbide mononitrate, metoprolol.  Paroxysmal atrial fibrillation: No evidence of recurrence.  Given that he is on dual antiplatelet therapy with history of GI bleed in the past, we will defer restarting warfarin at this time.  Chronic kidney disease stage III: Check BMP today in anticipation of medication adjustments outlined above.  We will proceed with renal ultrasound to exclude obstructive uropathy and bladder outlet obstruction.  History of GI bleed: With continued significant dyspnea and history of GI bleed, we will recheck CBC today at the setting of dual antiplatelet therapy with aspirin and ticagrelor.  Hyperlipidemia: Continue high intensity statin therapy.  Follow-up: Return to clinic in 2 weeks.  [name], MD 04/29/2019 8:02 PM

## 2019-04-28 NOTE — Patient Instructions (Addendum)
Medication Instructions:  Your physician recommends that you continue on your current medications as directed. Please refer to the Current Medication list given to you today.  *If you need a refill on your cardiac medications before your next appointment, please call your pharmacy*  Lab Work: Your physician recommends that you return for lab work in: TODAY as you leave at the [facility] or tomorrow. BMP, CBC.  - Please go to the [facility]. You will check in at the front desk to the right as you walk into the atrium. Valet Parking is offered if needed. - No appointment needed. You may go any day between 7 am and 6 pm.  If you have labs (blood work) drawn today and your tests are completely normal, you will receive your results only by: [name] MyChart Message (if you have MyChart) OR . A paper copy in the mail If you have any lab test that is abnormal or we need to change your treatment, we will call you to review the results.  Testing/Procedures: Your physician has requested that you have a renal ultrasound to include the bladder. Please make sure to have a full bladder when you arrive for the test. LOCATION: Med Center in [place]: 04/29/19, arrive at 12:45.    Follow-Up: At [facility], you and your health needs are our priority.  As part of our continuing mission to provide you with exceptional heart care, we have created designated Provider Care Teams.  These Care Teams include your primary Cardiologist (physician) and Advanced Practice Providers (APPs -  Physician Assistants and Nurse Practitioners) who all work together to provide you with the care you need, when you need it.  Your next appointment:   2 weeks  The format for your next appointment:   In Person  Provider:    You may see [name], MD or one of the following Advanced Practice Providers on your designated Care Team:    [name], NP  [name], PA-C  [name],  PA-C

## 2019-04-28 NOTE — Telephone Encounter (Signed)
Error

## 2019-04-29 ENCOUNTER — Encounter: Payer: Self-pay | Admitting: Internal Medicine

## 2019-04-29 ENCOUNTER — Telehealth: Payer: Medicare HMO

## 2019-04-29 ENCOUNTER — Telehealth: Payer: Self-pay | Admitting: Internal Medicine

## 2019-04-29 ENCOUNTER — Telehealth: Payer: Self-pay | Admitting: *Deleted

## 2019-04-29 ENCOUNTER — Other Ambulatory Visit: Payer: Self-pay

## 2019-04-29 ENCOUNTER — Ambulatory Visit
Admission: RE | Admit: 2019-04-29 | Discharge: 2019-04-29 | Disposition: A | Discharge status: Home / Self Care | Payer: Medicare HMO | Source: Ambulatory Visit | Attending: Internal Medicine | Admitting: Internal Medicine

## 2019-04-29 DIAGNOSIS — N184 Chronic kidney disease, stage 4 (severe): Secondary | ICD-10-CM | POA: Diagnosis not present

## 2019-04-29 DIAGNOSIS — N320 Bladder-neck obstruction: Secondary | ICD-10-CM | POA: Insufficient documentation

## 2019-04-29 DIAGNOSIS — N189 Chronic kidney disease, unspecified: Secondary | ICD-10-CM | POA: Diagnosis not present

## 2019-04-29 DIAGNOSIS — I10 Essential (primary) hypertension: Secondary | ICD-10-CM

## 2019-04-29 DIAGNOSIS — I5022 Chronic systolic (congestive) heart failure: Secondary | ICD-10-CM

## 2019-04-29 MED ORDER — TORSEMIDE 20 MG PO TABS: 40.0000 mg | ORAL_TABLET | Freq: Two times a day (BID) | ORAL | 1 refills | Status: DC

## 2019-04-29 MED ORDER — HYDRALAZINE HCL 50 MG PO TABS: 50.0000 mg | ORAL_TABLET | Freq: Three times a day (TID) | ORAL | 1 refills | Status: DC

## 2019-04-29 NOTE — Telephone Encounter (Signed)
-----   Message from [name], MD sent at 04/29/2019  2:20 PM EST ----- Please let Lucas Caldwell know that his kidney ultrasound is normal.  He does not appear to be retaining much urine.  His labs shows stable blood counts and improved kidney function.  I recommend that we increase his torsemide to 40 mg BID and also increase his hydralazine to 50 mg TID.  He should f/u with me as planned on 2/24, with a STAT BMP at the medical mall before the visit.  If his dyspnea does not begin to improve over the next few days, he should leg [place] know.

## 2019-04-29 NOTE — Telephone Encounter (Signed)
Ultrasound department calling  States patient was sent for a US Renal ultrasound yesterday They are still awaiting an order to be placed Please review and complete

## 2019-04-29 NOTE — Telephone Encounter (Signed)
Results called to pt and wife. They verbalized understanding of results and plan of care. Wife wrote down and repeated the med instructions back to me several times. They are aware to get lab work prior to appointment in the [facility] prior to appointment. Med list updated. Rx sent to pharmacy.

## 2019-04-29 NOTE — Telephone Encounter (Signed)
S/w Anderson Malta at the [facility] Radiology. She cannot see the order on her end. I can see the order and appt linked. She said she will ask her manager tomorrow and see if there's another way to see it and let me know if I need to do anything else.

## 2019-04-30 DIAGNOSIS — E1140 Type 2 diabetes mellitus with diabetic neuropathy, unspecified: Secondary | ICD-10-CM | POA: Diagnosis not present

## 2019-04-30 DIAGNOSIS — E1122 Type 2 diabetes mellitus with diabetic chronic kidney disease: Secondary | ICD-10-CM | POA: Diagnosis not present

## 2019-04-30 DIAGNOSIS — I255 Ischemic cardiomyopathy: Secondary | ICD-10-CM | POA: Diagnosis not present

## 2019-04-30 DIAGNOSIS — I5042 Chronic combined systolic (congestive) and diastolic (congestive) heart failure: Secondary | ICD-10-CM | POA: Diagnosis not present

## 2019-04-30 DIAGNOSIS — J432 Centrilobular emphysema: Secondary | ICD-10-CM | POA: Diagnosis not present

## 2019-04-30 DIAGNOSIS — N184 Chronic kidney disease, stage 4 (severe): Secondary | ICD-10-CM | POA: Diagnosis not present

## 2019-04-30 DIAGNOSIS — I480 Paroxysmal atrial fibrillation: Secondary | ICD-10-CM | POA: Diagnosis not present

## 2019-04-30 DIAGNOSIS — I2510 Atherosclerotic heart disease of native coronary artery without angina pectoris: Secondary | ICD-10-CM | POA: Diagnosis not present

## 2019-04-30 DIAGNOSIS — I130 Hypertensive heart and chronic kidney disease with heart failure and stage 1 through stage 4 chronic kidney disease, or unspecified chronic kidney disease: Secondary | ICD-10-CM | POA: Diagnosis not present

## 2019-05-03 ENCOUNTER — Telehealth: Payer: Self-pay | Admitting: Internal Medicine

## 2019-05-03 DIAGNOSIS — J984 Other disorders of lung: Secondary | ICD-10-CM

## 2019-05-03 DIAGNOSIS — I5022 Chronic systolic (congestive) heart failure: Secondary | ICD-10-CM | POA: Diagnosis not present

## 2019-05-03 NOTE — Telephone Encounter (Signed)
Please change order to match what patient is getiing

## 2019-05-03 NOTE — Telephone Encounter (Signed)
Called and spoke to pt's spouse, [name](DPR).  Per [name] pt is experiencing increased sob with exertion x1-2w. Pt is unable to walk from the bathroom to the living without getting winded.   [name] stated that pt is currently wearing 4L at night and as needed during the day, however per ONO order pt is supposed to only be on 1L QHS.  Pt does not have a pulse ox to monitor oxygen sats.   Denied fever, chills, sweats or additional symptoms.   [name] is requesting recommendations.   DK, please advise.

## 2019-05-03 NOTE — Telephone Encounter (Signed)
Due to Dr. Mortimer Fries not having any openings, I have discussed pt's symptoms with Dr. Patsey Berthold as she has openings on 05/05/2019. Upon research of pt's chart, Dr. Patsey Berthold feels that the increased sob may be related to Brilinta that was started after heart cath. Dr. Patsey Berthold would like pt to discuss sx with cardiology.  Pt's spouse, [name](DPR) is aware of above recommendations and voiced her understanding.   DK, would you still like to increase oxygen to 4L?

## 2019-05-03 NOTE — Telephone Encounter (Signed)
Pt c/o Shortness Of Breath: STAT if SOB developed within the last 24 hours or pt is noticeably SOB on the phone  1. Are you currently SOB (can you hear that pt is SOB on the phone)? Yes, currently has oxygen on  2. How long have you been experiencing SOB?  A while, since stent was placed 3. Are you SOB when sitting or when up moving around? both  4. Are you currently experiencing any other symptoms? No other symptoms

## 2019-05-03 NOTE — Telephone Encounter (Signed)
Spoke with patient and wife. Patient is short-winded upon ambulation to bathroom and occasionally at rest.  He describes it as being the same kind of shortness of breath as he has been experiencing since having the catheterization. The same as when he went to the Emergency room last week.  He has oxygen for night time but has been using it during the day as well as 4 L.  There is a call patient placed to Pulmonology as well regarding this. Patient does not have a pulse oximeter to measure O2 sat.   Lorenso Quarry, PA-C has opening tomorrow and patient/wife agreeable to come int. Advised I will make Dr End aware and may call them in the morning with further details if needed.

## 2019-05-03 NOTE — Progress Notes (Signed)
Office Visit    Patient Name: Lucas Caldwell Date of Encounter: 05/04/2019  Primary Care Provider:  [name], DO Primary Cardiologist:  [name], MD  Chief Complaint    86 yo male with history of coronary artery disease, HFrEF, mixed ischemic and nonischemic cardiomyopathy, hypertension, hyperlipidemia, DM2, paroxysmal atrial fibrillation on chronic warfarin, stage III CKD, CML, iron deficiency anemia, GI bleed due to duodenal ulcers, BPH, and hypothyroidism, and who presents early for 2 week follow-up given report of continued SOB/DOE.  Past Medical History    Past Medical History:  Diagnosis Date  . Arthritis    "probably in his legs" (08/15/2016)  . Blind right eye   . BPH (benign prostatic hyperplasia)   . Breast asymmetry    Left breast is larger, present for several years.  [name] CAD (coronary artery disease)    a. Cath in the late 90's - reportedly ok;  b. 2014 s/p stenting x 2 @ [facility]; c 08/19/16 Cath/PCI with DES -> RCA, plan to treat LM 70% medically. Seen by surgery and felt to be too high risk for CABG; d. 08/2018 Cath: LM 70 (iFR 0.86), LAD 20ost, 40p, 50/71m D1 70, LCX patent stent, OM1 30, RCA patent stent, 437mSR, 40d, RPDA 30. PCWP 8. CO/CI 4.0/2.1.  . Marland Kitchenhronic combined systolic (congestive) and diastolic (congestive) heart failure (HCLa Tour   a. Previously reduced EF-->50% by echo in 2012;  b. 06/2015 Echo: EF 50-55%  c. 07/2016 Echo: EF 45-50%; d. 12/2017 Echo: EF 55%, Gr1 DD, mild MR; e. 08/2018 Echo: EF 35-40%, mildly dil PA.  . Marland Kitchenhronic kidney disease (CKD), stage IV (severe) (HCBaidland  . CML (chronic myelocytic leukemia) (HCDunning  . GERD (gastroesophageal reflux disease)   . GIB (gastrointestinal bleeding)    a. 12/2017 3 unit PRBC GIB in setting of coumadin-->Endo/colon multiple duodenal ulcers and a single bleeding ulcer in the proximal ascending colon status post hemostatic clipping x2.  . Gout   . Hyperlipemia   . Hypertension   . Hypothyroidism    . Iron deficiency anemia   . Ischemic cardiomyopathy    a. Previously reduced EF-->50% by echo in 2012;  b. 06/2015 Echo: EF 50-55%, Gr2 DD, mild MR, mildly dil LA, Ao sclerosis, mild TR;  c. 07/2016 Echo: EF 45-50%; d. 12/2017 Echo: EF 55%, Gr1 DD, mild MR; e. 08/2018 Echo: EF 35-40%.  . Migraine    "in the 1960s" (08/15/2016)  . PAF (paroxysmal atrial fibrillation) (HCC)    a. ? Dx 2014-->s/p DCCV;  b. CHA2DS2VASc = 6--> Coumadin.  . Prostate cancer (HCHolley  . RBBB   . Type II diabetes mellitus (HCWilsey   Past Surgical History:  Procedure Laterality Date  . CATARACT EXTRACTION W/ INTRAOCULAR LENS  IMPLANT, BILATERAL Bilateral   . COLONOSCOPY N/A 01/13/2018   Procedure: COLONOSCOPY;  Surgeon: VaLin LandsmanMD;  [facility];  Service: Gastroenterology;  Laterality: N/A;  . COLONOSCOPY WITH PROPOFOL N/A 01/01/2018   Procedure: COLONOSCOPY WITH PROPOFOL;  Surgeon: VaLin LandsmanMD;  [facility];  Service: Gastroenterology;  Laterality: N/A;  . CORONARY ANGIOPLASTY WITH STENT PLACEMENT  09/2012   2 stents  . CORONARY STENT INTERVENTION N/A 08/19/2016   Procedure: Coronary Stent Intervention;  Surgeon: EnNelva BushMD;  [facility];  Service: Cardiovascular;  Laterality: N/A;  . CORONARY STENT INTERVENTION N/A 04/13/2019   Procedure: CORONARY STENT INTERVENTION;  Surgeon: EnNelva BushMD;  Location:  [place] CV LAB;  Service: Cardiovascular;  Laterality: N/A;  . ESOPHAGEAL MANOMETRY N/A 12/16/2017   Procedure: ESOPHAGEAL MANOMETRY (EM);  Surgeon: Lin Landsman, MD;  Location: [facility] ENDOSCOPY;  Service: Gastroenterology;  Laterality: N/A;  . ESOPHAGOGASTRODUODENOSCOPY N/A 12/20/2016   Procedure: ESOPHAGOGASTRODUODENOSCOPY (EGD);  Surgeon: Lin Landsman, MD;  [facility];  Service: Gastroenterology;  Laterality: N/A;  . ESOPHAGOGASTRODUODENOSCOPY N/A 01/13/2018   Procedure: ESOPHAGOGASTRODUODENOSCOPY (EGD);   Surgeon: Lin Landsman, MD;  Location: [facility] END[facility]COPY;  Service: Gastroenterology;  Laterality: N/A;  . ESOPHAGOGASTRODUODENOSCOPY (EGD) WITH PROPOFOL N/A 11/03/2017   Procedure: ESOPHAGOGASTRODUODENOSCOPY (EGD) WITH PROPOFOL;  Surgeon: Lin Landsman, MD;  Location: [facility] ENDOSCOPY;  Service: Gastroenterology;  Laterality: N/A;  . EYE SURGERY    . HERNIA REPAIR     "navel"  . LEFT HEART CATH AND CORONARY ANGIOGRAPHY N/A 08/14/2016   Procedure: Left Heart Cath and Coronary Angiography;  Surgeon: [name], MD;  Location: [place] CV LAB;  Service: Cardiovascular;  Laterality: N/A;  . RIGHT/LEFT HEART CATH AND CORONARY ANGIOGRAPHY N/A 09/01/2018   Procedure: RIGHT/LEFT HEART CATH AND CORONARY ANGIOGRAPHY;  Surgeon: [name], MD;  Location: [place] CV LAB;  Service: Cardiovascular;  Laterality: N/A;  . RIGHT/LEFT HEART CATH AND CORONARY ANGIOGRAPHY N/A 04/13/2019   Procedure: RIGHT/LEFT HEART CATH AND CORONARY ANGIOGRAPHY;  Surgeon: [name], MD;  Location: [place] CV LAB;  Service: Cardiovascular;  Laterality: N/A;  . TOE AMPUTATION Right    "big toe"    Allergies  Allergies  Allergen Reactions  . Clopidogrel Anaphylaxis    altered mental status;  (electrolytes "out of wack" and confusion per family; THEY DO NOT REMEMBER ANY OTHER REACTION)- MSB 10/10/15  . Ciprofloxacin Itching  . Mirtazapine Diarrhea    Bad dreams   . Spironolactone     gynecomastia    History of Present Illness    Mr. Willcutt is an 86 year old male with PMH as above. He underwent 08/2016 PCI of the RCA 2/2 persistent DOE complicated by CKD. He has needed frequent outpatient adjustment to his diuretics.  He underwent repeat diagnostic cath 08/2018 revealing a 40% LM stenosis with abnormal IFR of 0.86.  It was felt that the drop in IFR was secondary to pLAD disease with only a small gradient involving the ostial left main.  Nitrate was increased; however, he continued to  experience DOE despite further titration of outpatient diuretics.  He was seen on 04/02/2019 with subsequent repeat cath showing multivessel CAD similar to prior catheterizations with moderately elevated left heart filling pressure and mildly elevated right heart filling pressure.  It was noted he had 60% ostial LM stenosis with 60/70% mLAD dz. He had nonobstructive circumflex and RCA disease.  He was successfully treated with DES to the most significant mLADs.  Right heart cath was also performed and notable for elevated PCWP of 58mHg. He was admitted for diuresis over the next two days and noted improved DOE.  His metoprolol had been titrated to 50 mg daily with hydralazine added for additional afterload reduction. Given his prior history of GI bleed and in the setting of recent PCI with DAPT, warfarin was on hold.  Resumption of warfarin and discontinuation of Brilinta could be reconsidered in approximately 3 months.  He diuresed -3.8 L from admission with weight down to 91.5 kg.  He reported feeling 50 to 75% better prior to discharge.  He was discharged on torsemide 40 mg every morning and 20 mg in the evening. He was  scheduled for cardiac rehab and continued on beta-blocker, hydralazine, and nitrate. Renal function precluded addition of ACE/ARB/ARNI/MRA. On 04/19/19, he called the office with ongoing exertional dizziness and DOE since his cardiac catheterization and was referred to the ED.  Workup was unrevealing, and he was discharged home once he ruled out for PE. It was noted outpatient carotid dopplers oculd be considered. Subsequent echo showed LVEF 30-35%.  He was seen again 04/28/19 by his primary cardiologist and still reported feeling significantly SOB, similar to in the ED the week prior. He wondered if he was retaining urine as he felt his abdomen was swollen and did have a history of urinary retention following his cath. He was noted to be mildly volume overloaded though his weight was down from  prior visits. A repeat BMET showed stable renal function and renal ultrasound was negative for retention. His torsemide was increased to 17m BID and hydralazine to 527mTID to improve afterload reduction. ACE/ARB was again deferred pending reevaluation of his renal function.[place] some of his dyspnea could be 2/2 ticagrelor with preference to continue for at least 3 months if possible (and 1 month if absolutely needed). Then, on 05/03/19, te patient called the office and reported he was SOB and felt DOE with ambulation to the restroom. He reported he was using his night oxygen during the day. He was unable to measure his SpO2 (no pulse ox) and clinic appointment scheduled for next day.  Since that time, he has reportedly been increasingly short of breath with significant dyspnea on exertion.  No recent CP. He reports that he has not yet done a sleep study as pulmonology recommended but does use nighttime oxygen.  [name] not using his oxygen during the day.  He has not noticed a difference in his symptoms with increase of torsemide to 40 mg twice daily and hydralazine 50 mg 3 times daily.  He continues to feel as if he is holding onto fluid.  He feels unsteady when standing.  At times, he notices racing heart rate.  No reported palpitations.  He does note that he is constantly dizzy, which is not a new finding for him.  No recent falls or loss of consciousness.  He reports stable orthopnea (2 pillow).  No reported PND, abdominal distention, or lower extremity edema (though abdominal distention and lower extremity edema noted on today's exam).  He reports that he tries to reduce his water intake, though frequently is thirsty.  He does not add salt to his food.  He has had difficulty weighing himself recently, given his unsteadiness. He does not drink alcohol or currently use tobacco.   Home Medications    Prior to Admission medications   Medication Sig  Start Date End Date Taking? Authorizing Provider  ACCU-CHEK AVIVA PLUS test strip Use to check blood sugar up to 2 times daily 04/20/19   KaOlin HauserDO  Accu-Chek Softclix Lancets lancets Use to check blood sugar up to 2 times daily. 04/27/19   Karamalegos, AlDevonne DoughtyDO  acetaminophen (TYLENOL) 500 MG tablet Take 1,000 mg by mouth every 6 (six) hours as needed for moderate pain or headache.     [provider]  Alcohol Swabs 70 % PADS Use when checking blood sugar up to 3x daily 04/09/17   Karamalegos, AlDevonne DoughtyDO  allopurinol (ZYLOPRIM) 100 MG tablet TAKE ONE TABLET BY MOUTH ONCE DAILY Patient taking differently: Take 100  mg by mouth 2 (two) times daily.  02/22/19   Karamalegos, [name], DO  aspirin 81 MG chewable tablet Chew 1 tablet (81 mg total) by mouth daily. 04/16/19   Theora Gianotti, NP  Blood Glucose Monitoring Suppl (ACCU-CHEK AVIVA PLUS) w/Device KIT Use to check blood sugar up to 2 times daily 11/25/18   Parks Ranger, [name], DO  bosutinib (BOSULIF) 100 MG tablet Take 100 mg by mouth at bedtime as needed (tired/not feeling well). Take with food.     [provider]  Dulaglutide (TRULICITY) 0.09 QZ/3.0QT SOPN Inject 0.75 mg into the skin once a week. Patient taking differently: Inject 0.75 mg into the skin every Saturday.  10/31/17   Karamalegos, [name], DO  gabapentin (NEURONTIN) 300 MG capsule Take 2 capsules (600 mg total) by mouth 2 (two) times daily. Max dose = 1290m daily for kidney function Patient taking differently: Take 600 mg by mouth 2 (two) times daily.  03/25/19   Karamalegos, ADevonne Doughty DO  hydrALAZINE (APRESOLINE) 50 MG tablet Take 1 tablet (50 mg total) by mouth 3 (three) times daily. 04/29/19 07/28/19  End, CHarrell Gave MD  Insulin Glargine ([name] KWIKPEN) 100 UNIT/ML SOPN Inject 38 Units into the skin daily.     [provider]  Insulin Pen Needle 31G X 5 MM MISC 31 g by Does not apply route as directed.  04/16/17   KMikey College NP  isosorbide mononitrate (IMDUR) 60 MG 24 hr tablet TAKE 1 AND 1/2 TABLETS BY MOUTH ONCE DAILY Patient taking differently: Take 90 mg by mouth daily.  03/23/19   End, CHarrell Gave MD  levothyroxine (SYNTHROID) 25 MCG tablet TAKE 1 TABLET (25 MCG TOTAL) BY MOUTH DAILY BEFORE BREAKFAST. 09/28/18   Karamalegos, ADevonne Doughty DO  loperamide (IMODIUM A-D) 2 MG tablet Take 2 mg by mouth 3 (three) times daily as needed for diarrhea or loose stools.     [provider]  Menthol-Zinc Oxide (GOLD BOND EX) Apply 1 application topically daily as needed (muscle pain).    [provider]  metoprolol succinate (TOPROL-XL) 50 MG 24 hr tablet Take 1 tablet (50 mg total) by mouth daily. 04/15/19   BTheora Gianotti NP  Multiple Vitamins-Minerals (MULTIVITAMIN ADULTS 50+ PO) Take 1 tablet by mouth every morning.    [provider]  nitroGLYCERIN (NITROSTAT) 0.4 MG SL tablet Place 1 tablet (0.4 mg total) under the tongue every 5 (five) minutes x 3 doses as needed for chest pain. 12/23/18   End, CHarrell Gave MD  omeprazole (PRILOSEC) 20 MG capsule Take 20 mg by mouth 2 (two) times daily.    [provider]  Polyethyl Glycol-Propyl Glycol (SYSTANE OP) Place 1 drop into both eyes daily as needed (dry eyes).    [provider]  polyethylene glycol (MIRALAX / GLYCOLAX) 17 g packet Take 17 g by mouth daily as needed for mild constipation.     [provider]  PROVENTIL HFA 108 (90 Base) MCG/ACT inhaler Inhale 2 puffs into the lungs every 6 (six) hours as needed for wheezing or shortness of breath. 07/16/18   Karamalegos, ADevonne Doughty DO  ranolazine (RANEXA) 500 MG 12 hr tablet TAKE ONE TABLET BY MOUTH TWICE DAILY Patient taking differently: Take 500 mg by mouth 2 (two) times daily.  03/23/19   End, CHarrell Gave MD  rosuvastatin (CRESTOR) 5 MG tablet Take 1 tablet (5 mg total) by mouth daily. Patient taking differently: Take 5 mg by mouth  at bedtime.  07/14/18 12/23/27  End, [name], MD  senna (SENOKOT) 8.6 MG tablet Take 1 tablet by mouth daily as needed for constipation.    [provider]  ticagrelor (BRILINTA) 90 MG TABS tablet Take 1 tablet (90 mg total) by mouth 2 (two) times daily. 04/15/19   Theora Gianotti, NP  torsemide (DEMADEX) 20 MG tablet Take 2 tablets (40 mg total) by mouth 2 (two) times daily. 04/29/19   End, [name], MD  traMADol (ULTRAM) 50 MG tablet Take 1 tablet (50 mg total) by mouth every 8 (eight) hours as needed for moderate pain. 11/25/18   [name], DO    Review of Systems    He denies chest pain, palpitations, pnd, n, v, syncope, edema, weight gain, or early satiety.  He reports racing heart rate, dyspnea, shortness of breath, stable orthopnea 2 pillows, chronic dizziness.he also reports recent lighter stool.  All other systems reviewed and are otherwise negative except as noted above.  Physical Exam    VS:  BP (!) 102/58 (BP Location: Left Arm, Patient Position: Sitting, Cuff Size: Large)   Pulse 94   Ht _0  (1.626 m)   Wt 191 lb 4 oz (86.8 kg)   SpO2 96%   BMI 32.83 kg/m  , BMI Body mass index is 32.83 kg/m. GEN: Obese male, in no acute distress.  Accompanied by his wife. HEENT: normal. Neck: JVD difficult to assess due to body habitus Cardiac: RRR, no murmurs, rubs, or gallops. No clubbing, cyanosis, moderate to 1+ bilateral edema.  Radials/DP/PT 2+ and equal bilaterally.  Respiratory:  Respirations regular and unlabored, bibasilar reduced breath sounds. GI: Firm and mildly distended, BS + x 4. MS: no deformity or atrophy. Skin: warm and dry, no rash. Neuro:  Strength and sensation are intact. Psych: Normal affect.  Accessory Clinical Findings    ECG personally reviewed by me today -sinus rhythm with first-degree AV block, 94 bpm, PR interval 210 ms, QRS 176 ms, bifascicular block: left anterior fascicular block with right bundle branch block, LVH-  no acute changes.  Echo 04/23/19 1. Left ventricular ejection fraction, by visual estimation, is 30 to  35% (previous 35-40). The left ventricle has moderate to severely decreased function. There  is mildly increased left ventricular hypertrophy.  2. The left ventricle demonstrates global hypokinesis, lateral wall  motion best preserved.  3. Global right ventricle has normal systolic function.The right  ventricular size is normal. No increase in right ventricular wall  thickness.  4. Left atrial size was normal.  5. TR signal is inadequate for assessing pulmonary artery systolic  pressure.   R/LHC 04/13/19 Conclusions: 1. Multivessel coronary artery, similar to prior catheterization in 08/2018.  There is diffuse disease involving the ostial through mid LAD, up to 70%.  Prior iFR and 08/2018 showed sequential LAD lesions were hemodynamically significant. 2. Moderately elevated left heart filling pressure and pulmonary artery pressure. 3. Mildly elevated right heart filling pressure. 4. Mildly reduced Fick cardiac output/index. 5. Successful PCI to mid LAD using Resolute Onyx 2.5 x 15 mm drug eluting stent with <10% residual stenosis and TIMI-3 flow. Recommendations: 1. Dual antiplatelet therapy with aspirin and ticagrelor for 3 months, at which time we will consider restarting warfarin and dropping ticagrelor. 2. Left heart pressures are significantly higher than prior catheterization in 08/2018.  Patient needs further diuresis  and optimization of evidence-based heart failure therapy, including afterload reduction.  01/2019 PFTs Spirometry Data Is Acceptable and Reproducible Moderate Restrictive lung disease Disease with Significant Broncho-Dilator Response  Consider outpatient Pulmonary Consultation if needed Clinical Correlation Advised  Zio 01/22/2019  The patient was monitored for 10 days, 16 hours. 8 days, 23 hours were adequate for analysis.  The predominant rhythm was  sinus with an average rate of 83 bpm (range 59-128 bpm in sinus).  There were rare PAC's and PVC's.  Multiple (25) atrial runs were observed, lasting up to 16 beats. The maximal rate was 174 bpm.  There was no sustained arrhythmia or prolonged pause.  Patient triggered events correspond to sinus rhythm and PVC's. Predominantly sinus rhythm with rare PVC's.  Multiple brief runs of PSVT noted.  04/28/19: Na 136, K 3.5, Cr 1.47, BUN 20, Ca 9.5, Hgb 12.7, WBC 7.6, CT 39.3, TSH 1.971, Hg A1C 7.6 08/2018 LDL 51  Filed Weights   05/04/19 1419  Weight: 191 lb 4 oz (86.8 kg)  Previous clinic weight 206 pounds  Assessment & Plan    Chronic HFrEF --Continues to report dyspnea, despite recent medication changes.  Since increased diuresis and hydralazine, weight is signiicantly down from his visit 6 days prior. Suspect he may still be holding on to some volume. His abdomen is distended with recent bladder scan negative for urinary retention.  Will recheck a BMET/Mg  given his recent increase in torsemide to 40 mg twice daily.  Denies any recent constipation or diarrhea. Limiting salt and fluid. Will get a ReDS reading to allow for future trending of ReDS (31%).  Continue hydralazine for afterload reduction.  Continue to defer ACE/ARB given low BP today with known CKD. Reports adequate urine output on torsemide. Consider worsening renal dz as contributing to sx, as well as known pulmonary dz. Consider also history of atrial runs with most recent Zio above. [name] scheduled for follow-up in the advanced heart failure clinic in [facility] 3/18, as well as with pulmonology   DOE/SOB --Suspect that dyspnea and shortness of breath is multifactorial in the setting of pulmonary disease and known systolic heart failure with history of PSVT and atrial tachycardia.  Oxygen saturation today 96% on room air.  Recent increase in torsemide and hydralazine with patient reporting no improvement in sx, despite weight  decrease from that of previous visit and volume status improvement when compared with last physical event.  ReDS vest (31%).  Previous PE work-up negative.  As previously noted, he may benefit from sleep study. Long discussion regarding comorbid conditions, including pulmonary dz with upcoming appointment. He will plan to discuss current ticagrelor and BB at follow-up with primary cardiologist. On review of EMR, appears pulmonology also plans to discuss ticareglor with primary cardiologist.  Dizziness --Consider carotid scans at RTC, as carotid dz could contribute to dizziness / sx. Consider also current comorbid conditions as contributing to his current sx.   Coronary artery disease without angina --No recent or current chest pain. Consider DOE as anginal equivalent - recent cath above. As previously noted, consider dyspnea secondary to ticagrelor or BB, though he has tolerated this medication in the past.  [name] scheduled with his primary cardiologist next week and will plan to discuss this medication at that time. Continue to hold Warfarin for now on DAPT. No s/sx of bleeding. Continue metoprolol, Imdur, and Ranexa. Continue risk factor modification.   HTN --Blood pressure well controlled today and improved from last week's visit and s/p increased diuresis and hydralazine.  Recheck BMET.  Continue current torsemide and hydralazine in addition to metoprolol and Imdur.  Paroxysmal atrial fibrillation, PSVT --No evidence of recurrence.  Does report racing HR and has reported palpitations in the past. Consider as contributing to his DOE. 01/2019 ZIO as above.  As previously noted, he is on dual antiplatelet therapy with history of GI bleed in the past - defer restart of warfarin at this time. No s/sx of bleeding. Recheck a BMET/Mg given his most recent BMET showed lower K+ with recent increase of diuretic. Will also recheck TSH. Future considerations could include repeat Zio to quantify its burden and r/o  other arrhythmia not captured on EKG/telemetry in the past with referral to EP if needed.  Chronic kidney disease stage III --Recheck BMET today and titrate medications / K supplementation as needed.  History of GI bleed --Denies any signs or symptoms consistent with GI bleed today.  Does report lighter stools.  Recommended that he contact GI or his PCP with continued lighter stools.  HLD  --Continue high intensity statin. Last LDL well controlled and at goal <70.  Restrictive lung dz --Follow-up with pulmonology.   --- Disposition: Obtain Reds vest (31%).  Obtain BMET, magnesium, TSH.  Future recommendations possible include bilateral carotid study or Zio.  Plan for follow-up with heart failure clinic and pulmonology, as well as primary cardiologist. Pulmonology to reach out regarding SOB/DOE due to medications. Continue recently increased torsemide and hydralazine.  RTC as scheduled with primary cardiologist.   Arvil Chaco, PA-C 05/04/2019, 3:08 PM

## 2019-05-03 NOTE — Telephone Encounter (Signed)
Just to verify you would like to change oxygen order to 4L QHS, although ONO stated 1L? Pt is still experiencing SOB with 4L.

## 2019-05-03 NOTE — Telephone Encounter (Signed)
Yes, and patient probably need ER evaluation or Virtual OV from provider

## 2019-05-04 ENCOUNTER — Ambulatory Visit (INDEPENDENT_AMBULATORY_CARE_PROVIDER_SITE_OTHER): Payer: Medicare HMO | Admitting: Physician Assistant

## 2019-05-04 ENCOUNTER — Other Ambulatory Visit: Payer: Self-pay

## 2019-05-04 ENCOUNTER — Encounter: Payer: Self-pay | Admitting: Physician Assistant

## 2019-05-04 ENCOUNTER — Other Ambulatory Visit: Payer: Self-pay | Admitting: Pharmacy Technician

## 2019-05-04 VITALS — BP 102/58 | HR 94 | Ht 64.0 in | Wt 191.2 lb

## 2019-05-04 DIAGNOSIS — N184 Chronic kidney disease, stage 4 (severe): Secondary | ICD-10-CM

## 2019-05-04 DIAGNOSIS — J432 Centrilobular emphysema: Secondary | ICD-10-CM | POA: Diagnosis not present

## 2019-05-04 DIAGNOSIS — J984 Other disorders of lung: Secondary | ICD-10-CM

## 2019-05-04 DIAGNOSIS — R42 Dizziness and giddiness: Secondary | ICD-10-CM

## 2019-05-04 DIAGNOSIS — Z8719 Personal history of other diseases of the digestive system: Secondary | ICD-10-CM | POA: Diagnosis not present

## 2019-05-04 DIAGNOSIS — I471 Supraventricular tachycardia: Secondary | ICD-10-CM

## 2019-05-04 DIAGNOSIS — I25118 Atherosclerotic heart disease of native coronary artery with other forms of angina pectoris: Secondary | ICD-10-CM | POA: Diagnosis not present

## 2019-05-04 DIAGNOSIS — R0600 Dyspnea, unspecified: Secondary | ICD-10-CM

## 2019-05-04 DIAGNOSIS — E1140 Type 2 diabetes mellitus with diabetic neuropathy, unspecified: Secondary | ICD-10-CM | POA: Diagnosis not present

## 2019-05-04 DIAGNOSIS — I480 Paroxysmal atrial fibrillation: Secondary | ICD-10-CM | POA: Diagnosis not present

## 2019-05-04 DIAGNOSIS — E1122 Type 2 diabetes mellitus with diabetic chronic kidney disease: Secondary | ICD-10-CM | POA: Diagnosis not present

## 2019-05-04 DIAGNOSIS — I2510 Atherosclerotic heart disease of native coronary artery without angina pectoris: Secondary | ICD-10-CM | POA: Diagnosis not present

## 2019-05-04 DIAGNOSIS — R0609 Other forms of dyspnea: Secondary | ICD-10-CM

## 2019-05-04 DIAGNOSIS — I10 Essential (primary) hypertension: Secondary | ICD-10-CM | POA: Diagnosis not present

## 2019-05-04 DIAGNOSIS — I5022 Chronic systolic (congestive) heart failure: Secondary | ICD-10-CM

## 2019-05-04 DIAGNOSIS — I255 Ischemic cardiomyopathy: Secondary | ICD-10-CM | POA: Diagnosis not present

## 2019-05-04 DIAGNOSIS — E785 Hyperlipidemia, unspecified: Secondary | ICD-10-CM

## 2019-05-04 DIAGNOSIS — I5042 Chronic combined systolic (congestive) and diastolic (congestive) heart failure: Secondary | ICD-10-CM | POA: Diagnosis not present

## 2019-05-04 DIAGNOSIS — I130 Hypertensive heart and chronic kidney disease with heart failure and stage 1 through stage 4 chronic kidney disease, or unspecified chronic kidney disease: Secondary | ICD-10-CM | POA: Diagnosis not present

## 2019-05-04 NOTE — Patient Instructions (Signed)
Medication Instructions:  Your physician recommends that you continue on your current medications as directed. Please refer to the Current Medication list given to you today.  *If you need a refill on your cardiac medications before your next appointment, please call your pharmacy*  Lab Work: Your physician recommends that you have lab work today(BMET, Mag, Tsh)  If you have labs (blood work) drawn today and your tests are completely normal, you will receive your results only by: [name] MyChart Message (if you have MyChart) OR . A paper copy in the mail If you have any lab test that is abnormal or we need to change your treatment, we will call you to review the results.  Testing/Procedures: None ordered   Follow-Up: At [facility], you and your health needs are our priority.  As part of our continuing mission to provide you with exceptional heart care, we have created designated Provider Care Teams.  These Care Teams include your primary Cardiologist (physician) and Advanced Practice Providers (APPs -  Physician Assistants and Nurse Practitioners) who all work together to provide you with the care you need, when you need it.  Your next appointment:   Follow up as planned with Dr. Saunders Revel.

## 2019-05-04 NOTE — Telephone Encounter (Signed)
I think it is fine for him to see Jacquelyn today.  [name], MD [facility]

## 2019-05-04 NOTE — Patient Outreach (Signed)
[facility]) Care Management  05/04/2019  KOLTER REAVER [birth year] [id number]   Care coordination call placed to [place] in regards to patient's application for [place] and Trulicity.  Spoke to [facility] who informed patient was APPROVED 04/26/2018-03/17/2020. He informed a 4 months supply of medication was shipped 2/9 but does not have any delivery information.  Will outreach patient with this information.  Thomasina Housley P. Shaine Mount, [place] Management [phone]

## 2019-05-04 NOTE — Patient Outreach (Signed)
[facility]) Care Management  05/04/2019  LOFTON LEON 15-Apr-1933 [id number]    ADDENDUM  Successful call placed to patient regarding patient assistance medication delivery of [name] and Trulicity with [employer], HIPAA identifiers verified.   Spoke to patient's wife [name] who informed the medication has already arrived from the patient assistance company for her husband. Discussed refill procedure with [facility] and confirmed she had phone number to [place] should the automatic delivery not arrive and patient only has a 2 week supply or less remaining. [name] verbalized understanding. Confirmed patient had name and number.  Follow up:  Will route note to embedded [place] for case closure as patient assistance is completed.  Doretha Goding P. Kyson Kupper, [place] Management [phone]

## 2019-05-04 NOTE — Telephone Encounter (Signed)
Yes

## 2019-05-04 NOTE — Telephone Encounter (Signed)
Order has placed to [employer] to increased oxygen to 4L. Pt's spouse, [name] is aware and voiced her understanding.  Nothing further is needed.

## 2019-05-05 ENCOUNTER — Telehealth: Payer: Self-pay

## 2019-05-05 LAB — BASIC METABOLIC PANEL
BUN/Creatinine Ratio: 12 (ref 10–24)
BUN: 18 mg/dL (ref 8–27)
CO2: 23 mmol/L (ref 20–29)
Calcium: 9.6 mg/dL (ref 8.6–10.2)
Chloride: 97 mmol/L (ref 96–106)
Creatinine, Ser: 1.46 mg/dL — ABNORMAL HIGH (ref 0.76–1.27)
GFR calc Af Amer: 51 mL/min/{1.73_m2} — ABNORMAL LOW (ref >59)
GFR calc non Af Amer: 44 mL/min/{1.73_m2} — ABNORMAL LOW (ref >59)
Glucose: 127 mg/dL — ABNORMAL HIGH (ref 65–99)
Potassium: 3.8 mmol/L (ref 3.5–5.2)
Sodium: 139 mmol/L (ref 134–144)

## 2019-05-05 LAB — TSH: TSH: 3.85 u[IU]/mL (ref 0.450–4.500)

## 2019-05-05 LAB — MAGNESIUM: Magnesium: 1.8 mg/dL (ref 1.6–2.3)

## 2019-05-05 MED ORDER — METOPROLOL SUCCINATE ER 25 MG PO TB24: 25.0000 mg | ORAL_TABLET | Freq: Every day | ORAL | 1 refills | Status: DC

## 2019-05-05 NOTE — Telephone Encounter (Signed)
Returned call and spoke to wife.   I told her that new rx had been sent to pt preferred pharmacy on file for toprol 25 mg total, one tablet, once daily.   She verbalized understanding and will make change to his medications.   Advised pt to call for any further questions or concerns.

## 2019-05-05 NOTE — Telephone Encounter (Signed)
Call to patient to confirm how he is currently taking Toprol. Pt reports he is unaware. He reports wife handles all medications.   I attempted to call the house number but did not get an answer.   Will call back after lunch.

## 2019-05-05 NOTE — Telephone Encounter (Signed)
-----   Message from Arvil Chaco, [place] sent at 05/05/2019 11:12 AM EST ----- Regarding: [name] - Beta blocker Just spoke with Dr. Saunders Revel regarding this patient.  Can you call him and ask that he cut his metoprolol in half going forward and to Toprol 25mg  daily?   Thank you!  ----- Message ----- From: [name], MD Sent: 05/05/2019   6:30 AM EST To: Arvil Chaco, PA-C  Thanks for seeing him.  I will reach out to [place] to see if he can move up the HF appointment (no guarantees).  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  Do you mind cutting his metoprolol in half?  We may ultimately need to stop it, but I don't want him to rebound too much.  I'll let you know what I hear from [place].  Gerald Stabs ----- Message ----- From: Arvil Chaco, PA-C Sent: 05/04/2019   9:55 PM EST To: [name], MD  Just to keep you in the loop. Saw Mr. Lunz today. [name] still SOB despite increased hydralazine and torsemide; however, improved volume status and BP today when compared with vitals and exam of last visit. Checking labs after most recent bump in diuresis. He was fairly frustrated today. I told him I would reach out to you regarding ticagrelor.  Wife stated today you said you could move up their appointment with the advanced HF clinic if ongoing sx??? Currently scheduled with HF clinic and pulmonology, as well as to see you nxt week. Let me know if questions.

## 2019-05-07 ENCOUNTER — Telehealth: Payer: Self-pay

## 2019-05-07 NOTE — Telephone Encounter (Signed)
Call to patient to discuss lab results.   Pt verbalized understanding. No further orders at this time.   Advised pt to call for any further questions or concerns.

## 2019-05-07 NOTE — Telephone Encounter (Signed)
-----   Message from Arvil Chaco, PA-C sent at 05/06/2019  6:00 PM EST ----- Good news! Please let Lucas Caldwell know that renal function and electrolytes are stable from that of previous labs and slightly improved since increasing his torsemide (fluid pill). His TSH is also normal, which is reassuring. No new changes to medications since we decreased his BB to Toprol 25mg  daily yesterday.

## 2019-05-11 ENCOUNTER — Ambulatory Visit: Payer: Medicare HMO | Admitting: Pharmacist

## 2019-05-11 DIAGNOSIS — I130 Hypertensive heart and chronic kidney disease with heart failure and stage 1 through stage 4 chronic kidney disease, or unspecified chronic kidney disease: Secondary | ICD-10-CM | POA: Diagnosis not present

## 2019-05-11 DIAGNOSIS — N184 Chronic kidney disease, stage 4 (severe): Secondary | ICD-10-CM | POA: Diagnosis not present

## 2019-05-11 DIAGNOSIS — I5042 Chronic combined systolic (congestive) and diastolic (congestive) heart failure: Secondary | ICD-10-CM | POA: Diagnosis not present

## 2019-05-11 DIAGNOSIS — I255 Ischemic cardiomyopathy: Secondary | ICD-10-CM | POA: Diagnosis not present

## 2019-05-11 DIAGNOSIS — E1121 Type 2 diabetes mellitus with diabetic nephropathy: Secondary | ICD-10-CM

## 2019-05-11 DIAGNOSIS — I480 Paroxysmal atrial fibrillation: Secondary | ICD-10-CM | POA: Diagnosis not present

## 2019-05-11 DIAGNOSIS — J432 Centrilobular emphysema: Secondary | ICD-10-CM | POA: Diagnosis not present

## 2019-05-11 DIAGNOSIS — I2510 Atherosclerotic heart disease of native coronary artery without angina pectoris: Secondary | ICD-10-CM | POA: Diagnosis not present

## 2019-05-11 DIAGNOSIS — E1122 Type 2 diabetes mellitus with diabetic chronic kidney disease: Secondary | ICD-10-CM | POA: Diagnosis not present

## 2019-05-11 DIAGNOSIS — E1140 Type 2 diabetes mellitus with diabetic neuropathy, unspecified: Secondary | ICD-10-CM | POA: Diagnosis not present

## 2019-05-11 NOTE — Chronic Care Management (AMB) (Signed)
Chronic Care Management   Follow Up Note   05/11/2019 Name: Lucas Caldwell MRN: [id number] DOB: 1934-02-16  Referred by: [name], DO Reason for referral : Chronic Care Management (Patient/caregiver Phone Call)   Lucas Caldwell is a 86 y.o. year old male who is a primary care patient of [name], DO. The CCM team was consulted for assistance with chronic disease management and care coordination needs.    I reached out to [name] by phone today.   Review of patient status, including review of consultants reports, relevant laboratory and other test results, and collaboration with appropriate care team members and the patient's provider was performed as part of comprehensive patient evaluation and provision of chronic care management services.      Outpatient Encounter Medications as of 05/11/2019  Medication Sig Note  . ACCU-CHEK AVIVA PLUS test strip Use to check blood sugar up to 2 times daily   . Accu-Chek Softclix Lancets lancets Use to check blood sugar up to 2 times daily.   [name] acetaminophen (TYLENOL) 500 MG tablet Take 1,000 mg by mouth every 6 (six) hours as needed for moderate pain or headache.    . Alcohol Swabs 70 % PADS Use when checking blood sugar up to 3x daily   . allopurinol (ZYLOPRIM) 100 MG tablet TAKE ONE TABLET BY MOUTH ONCE DAILY (Patient taking differently: Take 100 mg by mouth 2 (two) times daily. )   . aspirin 81 MG chewable tablet Chew 1 tablet (81 mg total) by mouth daily.   . Blood Glucose Monitoring Suppl (ACCU-CHEK AVIVA PLUS) w/Device KIT Use to check blood sugar up to 2 times daily   . bosutinib (BOSULIF) 100 MG tablet Take 100 mg by mouth at bedtime as needed (tired/not feeling well). Take with food.  08/31/2018: Pt's wife states he only takes this as needed when he's "not feeling well"  . Dulaglutide (TRULICITY) 5.97 CB/6.3AG SOPN Inject 0.75 mg into the skin once a week. (Patient taking differently:  Inject 0.75 mg into the skin every Saturday. )   . gabapentin (NEURONTIN) 300 MG capsule Take 2 capsules (600 mg total) by mouth 2 (two) times daily. Max dose = 1280m daily for kidney function (Patient taking differently: Take 600 mg by mouth 2 (two) times daily. )   . hydrALAZINE (APRESOLINE) 50 MG tablet Take 1 tablet (50 mg total) by mouth 3 (three) times daily.   . Insulin Glargine ([name] KWIKPEN) 100 UNIT/ML SOPN Inject 38 Units into the skin daily.    . Insulin Pen Needle 31G X 5 MM MISC 31 g by Does not apply route as directed.   . isosorbide mononitrate (IMDUR) 60 MG 24 hr tablet TAKE 1 AND 1/2 TABLETS BY MOUTH ONCE DAILY (Patient taking differently: Take 90 mg by mouth daily. )   . levothyroxine (SYNTHROID) 25 MCG tablet TAKE 1 TABLET (25 MCG TOTAL) BY MOUTH DAILY BEFORE BREAKFAST.   .Marland Kitchenloperamide (IMODIUM A-D) 2 MG tablet Take 2 mg by mouth 3 (three) times daily as needed for diarrhea or loose stools.    . Menthol-Zinc Oxide (GOLD BOND EX) Apply 1 application topically daily as needed (muscle pain).   . metoprolol succinate (TOPROL-XL) 25 MG 24 hr tablet Take 1 tablet (25 mg total) by mouth daily.   . Multiple Vitamins-Minerals (MULTIVITAMIN ADULTS 50+ PO) Take 1 tablet by mouth every morning.   . nitroGLYCERIN (NITROSTAT) 0.4 MG SL tablet Place 1 tablet (0.4 mg  total) under the tongue every 5 (five) minutes x 3 doses as needed for chest pain.   [name] omeprazole (PRILOSEC) 20 MG capsule Take 20 mg by mouth 2 (two) times daily.   [name] Place 1 drop into both eyes daily as needed (dry eyes).   . polyethylene glycol (MIRALAX / GLYCOLAX) 17 g packet Take 17 g by mouth daily as needed for mild constipation.    [name] PROVENTIL HFA 108 (90 Base) MCG/ACT inhaler Inhale 2 puffs into the lungs every 6 (six) hours as needed for wheezing or shortness of breath.   . ranolazine (RANEXA) 500 MG 12 hr tablet TAKE ONE TABLET BY MOUTH TWICE DAILY (Patient taking  differently: Take 500 mg by mouth 2 (two) times daily. )   . rosuvastatin (CRESTOR) 5 MG tablet Take 1 tablet (5 mg total) by mouth daily. (Patient taking differently: Take 5 mg by mouth at bedtime. )   . senna (SENOKOT) 8.6 MG tablet Take 1 tablet by mouth daily as needed for constipation.   . ticagrelor (BRILINTA) 90 MG TABS tablet Take 1 tablet (90 mg total) by mouth 2 (two) times daily.   [name] torsemide (DEMADEX) 20 MG tablet Take 2 tablets (40 mg total) by mouth 2 (two) times daily.   . traMADol (ULTRAM) 50 MG tablet Take 1 tablet (50 mg total) by mouth every 8 (eight) hours as needed for moderate pain.    No facility-administered encounter medications on file as of 05/11/2019.    Goals Addressed            This Visit's Progress   . PharmD - Medication Managment       CARE PLAN ENTRY (see longitudinal plan of care for additional care plan information) Current Barriers:  . Financial o Note patient has Partial Extra Help/LIS Subsidy . Limited vision . Chronic Disease Management support and education needs related to multiple chronic disease processes  Pharmacist Clinical Goal(s):  [name] Over the next 30 days, patient will work with CM Pharmacist to address needs related to medication adherence and diabetes management  Interventions: . Perform chart review . Received coordination of care message from [place] letting me know that patient received [name] and Trulicity from [employer] patient assistance program and [facility] CPhT reviewed refill process with Mrs. Rahal. [name] Reschedule appointment to review medications and CBG results as requested by patient's wife ? Mrs. Alfrey denies any current medication concerns on behalf of patient and would prefer to review next month. . Provide Mrs. Asch with phone number to follow up about COVID-19 vaccine appointment as requested.  Patient Self Care Activities:  . Check blood sugar, blood pressure and daily weight as directed by  providers  . Patient takes medications, with assistance of wife, as directed o Mrs. Moyers manages patient's medications using weekly pillbox as adherence tool . Patient attends scheduled medical appointments  Please see past updates related to this goal by clicking on the "Past Updates" button in the selected goal         Plan  The care management team will reach out to the patient again over the next 30 days.   [name], PharmD, [place] [employer] [phone]

## 2019-05-11 NOTE — Patient Instructions (Signed)
Thank you allowing the Chronic Care Management Team to be a part of your care! It was a pleasure speaking with you today!     CCM (Chronic Care Management) Team    Noreene Larsson RN, MSN, CCM Nurse Care Coordinator  [phone]   [name] PharmD  Clinical Pharmacist  [phone]   Eula Fried LCSW Clinical Social Worker [phone]  Visit Information  Goals Addressed            This Visit's Progress   . PharmD - Medication Managment       CARE PLAN ENTRY (see longitudinal plan of care for additional care plan information) Current Barriers:  . Financial o Note patient has Partial Extra Help/LIS Subsidy . Limited vision . Chronic Disease Management support and education needs related to multiple chronic disease processes  Pharmacist Clinical Goal(s):  [name] Over the next 30 days, patient will work with CM Pharmacist to address needs related to medication adherence and diabetes management  Interventions: . Perform chart review . Received coordination of care message from [place] Sim[place] letting me know that patient received [name] and Trulicity from [employer] patient assistance program and [facility] CPhT reviewed refill process with Mrs. Gowin. [name] Reschedule appointment to review medications and CBG results as requested by patient's wife ? Mrs. Danis denies any current medication concerns on behalf of patient and would prefer to review next month. . Provide Mrs. Tomasik with phone number to follow up about COVID-19 vaccine appointment as requested.  Patient Self Care Activities:  . Check blood sugar, blood pressure and daily weight as directed by providers  . Patient takes medications, with assistance of wife, as directed o Mrs. Parran manages patient's medications using weekly pillbox as adherence tool . Patient attends scheduled medical appointments  Please see past updates related to this goal by clicking on the "Past Updates" button in the selected goal          Caregiver verbalizes understanding of instructions provided today.   The care management team will reach out to the patient again over the next 30 days.   [name], PharmD, [place] [employer] [phone]

## 2019-05-12 ENCOUNTER — Other Ambulatory Visit: Payer: Self-pay

## 2019-05-12 ENCOUNTER — Ambulatory Visit (INDEPENDENT_AMBULATORY_CARE_PROVIDER_SITE_OTHER): Payer: Medicare HMO | Admitting: Internal Medicine

## 2019-05-12 ENCOUNTER — Encounter: Payer: Self-pay | Admitting: Internal Medicine

## 2019-05-12 ENCOUNTER — Ambulatory Visit (INDEPENDENT_AMBULATORY_CARE_PROVIDER_SITE_OTHER): Payer: Medicare HMO

## 2019-05-12 VITALS — BP 114/62 | HR 94 | Ht 64.0 in | Wt 208.0 lb

## 2019-05-12 DIAGNOSIS — I25118 Atherosclerotic heart disease of native coronary artery with other forms of angina pectoris: Secondary | ICD-10-CM | POA: Diagnosis not present

## 2019-05-12 DIAGNOSIS — I452 Bifascicular block: Secondary | ICD-10-CM

## 2019-05-12 DIAGNOSIS — I5023 Acute on chronic systolic (congestive) heart failure: Secondary | ICD-10-CM

## 2019-05-12 DIAGNOSIS — Z79899 Other long term (current) drug therapy: Secondary | ICD-10-CM | POA: Diagnosis not present

## 2019-05-12 DIAGNOSIS — Z7901 Long term (current) use of anticoagulants: Secondary | ICD-10-CM

## 2019-05-12 DIAGNOSIS — Z5181 Encounter for therapeutic drug level monitoring: Secondary | ICD-10-CM

## 2019-05-12 DIAGNOSIS — I5022 Chronic systolic (congestive) heart failure: Secondary | ICD-10-CM | POA: Diagnosis not present

## 2019-05-12 DIAGNOSIS — N183 Chronic kidney disease, stage 3 unspecified: Secondary | ICD-10-CM | POA: Diagnosis not present

## 2019-05-12 DIAGNOSIS — I480 Paroxysmal atrial fibrillation: Secondary | ICD-10-CM

## 2019-05-12 MED ORDER — TORSEMIDE 20 MG PO TABS: 60.0000 mg | ORAL_TABLET | Freq: Two times a day (BID) | ORAL | 2 refills | Status: DC

## 2019-05-12 NOTE — Progress Notes (Signed)
 Follow-up Outpatient Visit Date: 05/12/2019  Primary Care Provider: Karamalegos, Alexander J, DO [address] [place] [address]  Chief Complaint: Shortness of breath  HPI:  Mr. Scifres is a 86 y.o. 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  He was last seen in our office a week ago for reassessment of his progressive shortness of breath that did not improve despite escalation of diuresis and PCI to the mid LAD last month.  He has been referred to the heart failure clinic with upcoming visit next month.  Blood pressure and weight were both improved at his last visit on 05/04/2019, though his marked exertional dyspnea was unchanged.  He was noticed to have mild PR prolongation in the setting of baseline bafascicular block.  He was therefore advised to cut metoprolol from 50 mg daily to 25 mg daily.  Today, Mr. Klasen reports that his shortness of breath is unchanged with marked dyspnea with minimal activity.  He reports minimal chest pain/tightness, which improved following PCI last month.  He has not had any significant edema, though he continues to have occasional abdominal fullness/distention.  He remains compliant with his medications, including ASA, ticagrelor, and torsemide 40 mg BID.  He has been trying to minimize his sodium intake.  He notes occasional lightheadedness but has not fallen.  --------------------------------------------------------------------------------------------------  Past Medical History:  Diagnosis Date  . Arthritis    "probably in his legs" (08/15/2016)  . Blind right eye   . BPH (benign  prostatic hyperplasia)   . Breast asymmetry    Left breast is larger, present for several years.  . CAD (coronary artery disease)    a. Cath in the late 90's - reportedly ok;  b. 2014 s/p stenting x 2 @ [facility]; c 08/19/16 Cath/PCI with DES -> RCA, plan to treat LM 70% medically. Seen by surgery and felt to be too high risk for CABG; d. 08/2018 Cath: LM 70 (iFR 0.86), LAD 20ost, 40p, 50/60m, D1 70, LCX patent stent, OM1 30, RCA patent stent, 40m ISR, 40d, RPDA 30. PCWP 8. CO/CI 4.0/2.1.  . Chronic combined systolic (congestive) and diastolic (congestive) heart failure (HCC)    a. Previously reduced EF-->50% by echo in 2012;  b. 06/2015 Echo: EF 50-55%  c. 07/2016 Echo: EF 45-50%; d. 12/2017 Echo: EF 55%, Gr1 DD, mild MR; e. 08/2018 Echo: EF 35-40%, mildly dil PA.  . Chronic kidney disease (CKD), stage IV (severe) (HCC)   . CML (chronic myelocytic leukemia) (HCC)   . GERD (gastroesophageal reflux disease)   . GIB (gastrointestinal bleeding)    a. 12/2017 3 unit PRBC GIB in setting of coumadin-->Endo/colon multiple duodenal ulcers and a single bleeding ulcer in the proximal ascending colon status post hemostatic clipping x2.  . Gout   . Hyperlipemia   . Hypertension   . Hypothyroidism   . Iron deficiency anemia   . Ischemic cardiomyopathy    a. Previously reduced EF-->50% by echo in 2012;  b. 06/2015 Echo: EF 50-55%, Gr2 DD, mild MR, mildly dil LA, Ao sclerosis, mild TR;  c. 07/2016 Echo: EF 45-50%; d. 12/2017 Echo: EF 55%, Gr1 DD, mild MR; e. 08/2018 Echo: EF 35-40%.  . Migraine    "in   the 1960s" (08/15/2016)  . PAF (paroxysmal atrial fibrillation) (HCC)    a. ? Dx 2014-->s/p DCCV;  b. CHA2DS2VASc = 6--> Coumadin.  . Prostate cancer (HCC)   . RBBB   . Type II diabetes mellitus (HCC)    Past Surgical History:  Procedure Laterality Date  . CATARACT EXTRACTION W/ INTRAOCULAR LENS  IMPLANT, BILATERAL Bilateral   . COLONOSCOPY N/A 01/13/2018   Procedure: COLONOSCOPY;  Surgeon: Vanga, Rohini Reddy, MD;   Location: [facility];  Service: Gastroenterology;  Laterality: N/A;  . COLONOSCOPY WITH PROPOFOL N/A 01/01/2018   Procedure: COLONOSCOPY WITH PROPOFOL;  Surgeon: Vanga, Rohini Reddy, MD;  Location: [facility];  Service: Gastroenterology;  Laterality: N/A;  . CORONARY ANGIOPLASTY WITH STENT PLACEMENT  09/2012   2 stents  . CORONARY STENT INTERVENTION N/A 08/19/2016   Procedure: Coronary Stent Intervention;  Surgeon: , , MD;  Location: [facility];  Service: Cardiovascular;  Laterality: N/A;  . CORONARY STENT INTERVENTION N/A 04/13/2019   Procedure: CORONARY STENT INTERVENTION;  Surgeon: , , MD;  Location: [facility];  Service: Cardiovascular;  Laterality: N/A;  . ESOPHAGEAL MANOMETRY N/A 12/16/2017   Procedure: ESOPHAGEAL MANOMETRY (EM);  Surgeon: Vanga, Rohini Reddy, MD;  Location: [facility];  Service: Gastroenterology;  Laterality: N/A;  . ESOPHAGOGASTRODUODENOSCOPY N/A 12/20/2016   Procedure: ESOPHAGOGASTRODUODENOSCOPY (EGD);  Surgeon: Vanga, Rohini Reddy, MD;  Location: [facility];  Service: Gastroenterology;  Laterality: N/A;  . ESOPHAGOGASTRODUODENOSCOPY N/A 01/13/2018   Procedure: ESOPHAGOGASTRODUODENOSCOPY (EGD);  Surgeon: Vanga, Rohini Reddy, MD;  Location: [facility];  Service: Gastroenterology;  Laterality: N/A;  . ESOPHAGOGASTRODUODENOSCOPY (EGD) WITH PROPOFOL N/A 11/03/2017   Procedure: ESOPHAGOGASTRODUODENOSCOPY (EGD) WITH PROPOFOL;  Surgeon: Vanga, Rohini Reddy, MD;  Location: [facility];  Service: Gastroenterology;  Laterality: N/A;  . EYE SURGERY    . HERNIA REPAIR     "navel"  . LEFT HEART CATH AND CORONARY ANGIOGRAPHY N/A 08/14/2016   Procedure: Left Heart Cath and Coronary Angiography;  Surgeon: , , MD;  Location: [facility];  Service: Cardiovascular;  Laterality: N/A;  . RIGHT/LEFT HEART CATH AND CORONARY ANGIOGRAPHY N/A 09/01/2018   Procedure: RIGHT/LEFT HEART CATH AND CORONARY  ANGIOGRAPHY;  Surgeon: , , MD;  Location: [facility];  Service: Cardiovascular;  Laterality: N/A;  . RIGHT/LEFT HEART CATH AND CORONARY ANGIOGRAPHY N/A 04/13/2019   Procedure: RIGHT/LEFT HEART CATH AND CORONARY ANGIOGRAPHY;  Surgeon: , , MD;  Location: [facility];  Service: Cardiovascular;  Laterality: N/A;  . TOE AMPUTATION Right    "big toe"    Current Meds  Medication Sig  . ACCU-CHEK AVIVA PLUS test strip Use to check blood sugar up to 2 times daily  . Accu-Chek Softclix Lancets lancets Use to check blood sugar up to 2 times daily.  . acetaminophen (TYLENOL) 500 MG tablet Take 1,000 mg by mouth every 6 (six) hours as needed for moderate pain or headache.   . Alcohol Swabs 70 % PADS Use when checking blood sugar up to 3x daily  . allopurinol (ZYLOPRIM) 100 MG tablet TAKE ONE TABLET BY MOUTH ONCE DAILY (Patient taking differently: Take 100 mg by mouth 2 (two) times daily. )  . aspirin 81 MG chewable tablet Chew 1 tablet (81 mg total) by mouth daily.  . Blood Glucose Monitoring Suppl (ACCU-CHEK AVIVA PLUS) w/Device KIT Use to check blood sugar up to 2 times daily  . bosutinib (BOSULIF) 100 MG tablet Take 100 mg by mouth at bedtime as needed (tired/not feeling well).   Take with food.   . Dulaglutide (TRULICITY) 8.41 LK/4.4WN SOPN Inject 0.75 mg into the skin once a week. (Patient taking differently: Inject 0.75 mg into the skin every Saturday. )  . gabapentin (NEURONTIN) 300 MG capsule Take 2 capsules (600 mg total) by mouth 2 (two) times daily. Max dose = 125m daily for kidney function (Patient taking differently: Take 600 mg by mouth 2 (two) times daily. )  . hydrALAZINE (APRESOLINE) 50 MG tablet Take 1 tablet (50 mg total) by mouth 3 (three) times daily.  . Insulin Glargine ([name] KWIKPEN) 100 UNIT/ML SOPN Inject 38 Units into the skin daily.   . Insulin Pen Needle 31G X 5 MM MISC 31 g by Does not apply route as directed.  . isosorbide  mononitrate (IMDUR) 60 MG 24 hr tablet TAKE 1 AND 1/2 TABLETS BY MOUTH ONCE DAILY (Patient taking differently: Take 90 mg by mouth daily. )  . levothyroxine (SYNTHROID) 25 MCG tablet TAKE 1 TABLET (25 MCG TOTAL) BY MOUTH DAILY BEFORE BREAKFAST.  .Marland Kitchenloperamide (IMODIUM A-D) 2 MG tablet Take 2 mg by mouth 3 (three) times daily as needed for diarrhea or loose stools.   . Menthol-Zinc Oxide (GOLD BOND EX) Apply 1 application topically daily as needed (muscle pain).  . metoprolol succinate (TOPROL-XL) 25 MG 24 hr tablet Take 1 tablet (25 mg total) by mouth daily.  . Multiple Vitamins-Minerals (MULTIVITAMIN ADULTS 50+ PO) Take 1 tablet by mouth every morning.  . nitroGLYCERIN (NITROSTAT) 0.4 MG SL tablet Place 1 tablet (0.4 mg total) under the tongue every 5 (five) minutes x 3 doses as needed for chest pain.  .Marland Kitchenomeprazole (PRILOSEC) 20 MG capsule Take 20 mg by mouth 2 (two) times daily.  .[name] (SYSTANE OP) Place 1 drop into both eyes daily as needed (dry eyes).  . polyethylene glycol (MIRALAX / GLYCOLAX) 17 g packet Take 17 g by mouth daily as needed for mild constipation.   .Marland KitchenPROVENTIL HFA 108 (90 Base) MCG/ACT inhaler Inhale 2 puffs into the lungs every 6 (six) hours as needed for wheezing or shortness of breath.  . ranolazine (RANEXA) 500 MG 12 hr tablet TAKE ONE TABLET BY MOUTH TWICE DAILY (Patient taking differently: Take 500 mg by mouth 2 (two) times daily. )  . rosuvastatin (CRESTOR) 5 MG tablet Take 1 tablet (5 mg total) by mouth daily. (Patient taking differently: Take 5 mg by mouth at bedtime. )  . senna (SENOKOT) 8.6 MG tablet Take 1 tablet by mouth daily as needed for constipation.  . ticagrelor (BRILINTA) 90 MG TABS tablet Take 1 tablet (90 mg total) by mouth 2 (two) times daily.  . traMADol (ULTRAM) 50 MG tablet Take 1 tablet (50 mg total) by mouth every 8 (eight) hours as needed for moderate pain.  . [DISCONTINUED] torsemide (DEMADEX) 20 MG tablet Take 2 tablets (40  mg total) by mouth 2 (two) times daily.    Allergies: Clopidogrel, Ciprofloxacin, Mirtazapine, and Spironolactone  Social History   Tobacco Use  . Smoking status: Former Smoker    Packs/day: 2.00    Years: 15.00    Pack years: 30.00    Types: Cigarettes    Quit date: 1979    Years since quitting: 42.1  . Smokeless tobacco: Former [employer] developer . Tobacco comment: quit 1979  Substance Use Topics  . Alcohol use: No    Comment: previously drank heavily - quit 1979.  . Drug use: No    Family History  Problem Relation  Age of Onset  . Brain cancer Father   . Diabetes Sister   . Heart attack Sister     Review of Systems: A 12-system review of systems was performed and was negative except as noted in the HPI.  --------------------------------------------------------------------------------------------------  Physical Exam: BP 114/62 (BP Location: Left Arm, Patient Position: Sitting, Cuff Size: Normal)   Pulse 94   Ht 5' 4" (1.626 m)   Wt 208 lb (94.3 kg)   SpO2 97%   BMI 35.70 kg/m    ReDS Vest: 37 (previously 31 a week ago)  General:  NAD.  Accompanied by his wife. Neck: No obvious JVD, though body habitus limits evaluation. Lungs: Clear bilaterally without wheezes or crackles. Heart: RRR w/o murmurs, rubs, or gallops. Abdomen: Firm but not significantly distended.  Non-tender. Ext: Trace pretibial edema bilaterally.  EKG:  NSR with PVC's, bifascicular block (LAFB and RBBB).  PR has shortened since last week's EKG.  Lab Results  Component Value Date   WBC 7.6 04/28/2019   HGB 12.7 (L) 04/28/2019   HCT 39.3 04/28/2019   MCV 86.4 04/28/2019   PLT 212 04/28/2019    Lab Results  Component Value Date   NA 134 05/12/2019   K 3.7 05/12/2019   CL 96 05/12/2019   CO2 21 05/12/2019   BUN 21 05/12/2019   CREATININE 1.52 (H) 05/12/2019   GLUCOSE 233 (H) 05/12/2019   ALT 14 08/28/2018    Lab Results  Component Value Date   CHOL 110 08/28/2018   HDL 28 (L) 08/28/2018    LDLCALC 51 08/28/2018   TRIG 155 (H) 08/28/2018   CHOLHDL 3.9 08/28/2018    --------------------------------------------------------------------------------------------------  ASSESSMENT AND PLAN: Acute on chronic HFrEF: Mr. Veneziano continue to have significant dyspnea with minimal activity consistent with NYHA class III-IV heart failure.  He has only trace edema on exam today and no obvious JVD.  However, his weight is up 17 pounds in 1 week (question accuracy of last week's weight, though baseline weight is usually closer to 200 pounds).  His ReDS vest reading has also increased form 31% to 37% in one week despite continue his same diuretic regimen.  I have advised him to increase torsemide to 60 mg BID and continue the remainder of his goal directed medical therapy, including metoprolol succinate, hydralazine, and isosorbide mononitrate.  He has had problems with renal insufficiency in the past, limiting ACEI/ARB use in the past, though we may need to rechallenge him given worsening HF and declining LVEF.  I have reached out to Dr. Bensimhon to expedite advanced heart failure consultation.  Coronary artery disease with stable angina: Chest tightness has improved following PCI to mid LAD last month.  Given significant worsening of shortness of breath following catheterization, I wonder if ticagrelor is contributing to increased shortness of breath.  We had originally planned to complete 3 months of ASA and ticagrelor before considering transition to ASA and warfarin.  However, in light of severe symptoms, we have agreed to restart warfarin and to consider stopping ticagrelor once INR is therapeutic, as it will be > 1 month from PCI later this week.  Continue low-dose rosuvastatin; LDL at goal (<70).  Bifascicular block: EKG today with normalized PR interval following decrease in metoprolol dose last week.  Progression of underlying conduction disease will need to be watched closely, though I do  not believe this is the primary cause of Mr. Herard' dyspnea.  If his PR interval increases above 200 ms again, we will   need to consider stopping metoprolol altogether and referring Mr. Gentles to EP to discuss pacemaker placement in the setting of "trifascicular" block.  Chronic kidney disease stage III: I will check a BMP today given significant increase in weight and ReDS vest reading, given stable diuretic regimen and diet.  As above, we plan to increase torsemide to 60 mg BID and may need to consider augmentation with metolazone in the future.  Follow-up: INR check and BMP in 1 week; return to see me in 2 weeks.  [name], MD 05/13/2019 7:03 AM

## 2019-05-12 NOTE — Patient Instructions (Signed)
-   take 1.5 tablets today and tomorrow - then on Friday resume previous dosage of 1 TABLET EVERY DAY EXCEPT 1.5 TABLETS ON [place]. - recheck INR in 1 week.

## 2019-05-12 NOTE — Patient Instructions (Signed)
Medication Instructions:  Your physician has recommended you make the following change in your medication:  1- INCREASE Torsemide to 60 mg (3 tablets) by mouth two times a day. 2- START Coumadin as Mandi advised.  *If you need a refill on your cardiac medications before your next appointment, please call your pharmacy*  Lab Work: 1- Your physician recommends that you return for lab work in: [place].  2- Your physician recommends that you return for lab work in: 1 week on Wednesday when you come in for your Coumadin appointment.   If you have labs (blood work) drawn today and your tests are completely normal, you will receive your results only by: [name] MyChart Message (if you have MyChart) OR . A paper copy in the mail If you have any lab test that is abnormal or we need to change your treatment, we will call you to review the results.  Testing/Procedures: none  Follow-Up: At [facility] you and your health needs are our priority.  As part of our continuing mission to provide you with exceptional heart care, we have created designated Provider Care Teams.  These Care Teams include your primary Cardiologist (physician) and Advanced Practice Providers (APPs -  Physician Assistants and Nurse Practitioners) who all work together to provide you with the care you need, when you need it.  Your next appointment:   2 week(s) with Dr End.  The format for your next appointment:   In Person  Provider:   [name], MD

## 2019-05-13 ENCOUNTER — Encounter: Payer: Self-pay | Admitting: Internal Medicine

## 2019-05-13 ENCOUNTER — Ambulatory Visit: Payer: Medicare HMO | Attending: Internal Medicine

## 2019-05-13 ENCOUNTER — Telehealth (HOSPITAL_COMMUNITY): Payer: Self-pay

## 2019-05-13 DIAGNOSIS — Z23 Encounter for immunization: Secondary | ICD-10-CM

## 2019-05-13 DIAGNOSIS — I452 Bifascicular block: Secondary | ICD-10-CM | POA: Insufficient documentation

## 2019-05-13 LAB — BASIC METABOLIC PANEL
BUN/Creatinine Ratio: 14 (ref 10–24)
BUN: 21 mg/dL (ref 8–27)
CO2: 21 mmol/L (ref 20–29)
Calcium: 9.1 mg/dL (ref 8.6–10.2)
Chloride: 96 mmol/L (ref 96–106)
Creatinine, Ser: 1.52 mg/dL — ABNORMAL HIGH (ref 0.76–1.27)
GFR calc Af Amer: 48 mL/min/{1.73_m2} — ABNORMAL LOW (ref >59)
GFR calc non Af Amer: 42 mL/min/{1.73_m2} — ABNORMAL LOW (ref >59)
Glucose: 233 mg/dL — ABNORMAL HIGH (ref 65–99)
Potassium: 3.7 mmol/L (ref 3.5–5.2)
Sodium: 134 mmol/L (ref 134–144)

## 2019-05-13 NOTE — Telephone Encounter (Signed)
COVID-19 pre-appointment screening questions: ANSWERED BY WIFE [name]    Do you have a history of COVID-19 or a positive test result in the past 7-10 days? NO   To the best of your knowledge, have you been in close contact with anyone with a confirmed diagnosis of COVID 19? NO   Have you had any one or more of the following: Fever, chills, cough, shortness of breath (out of the normal for you) or any flu-like symptoms? NO   Are you experiencing any of the following symptoms that is new or out of usual for you: NO   . Ear, nose or throat discomfort . Sore throat . Headache . Muscle Pain . Diarrhea  . Loss of taste or smell   Reviewed all the following with patient: REVIEWED . Use of hand sanitizer when entering the building . Everyone is required to wear a mask in the building, if you do not have a mask we are happy to provide you with one when you arrive . NO Visitor guidelines   If patient answers YES to any of questions they must change to a virtual visit and place note in comments about symptoms

## 2019-05-13 NOTE — Progress Notes (Signed)
   Covid-19 Vaccination Clinic  Name:  Lucas Caldwell    MRN: [id number] DOB: 1933/12/05  05/13/2019  [name] was observed post Covid-19 immunization for 15 minutes without incidence. He was provided with Vaccine Information Sheet and instruction to access the V-Safe system.   [name] was instructed to call 911 with any severe reactions post vaccine: [name] Difficulty breathing  . Swelling of your face and throat  . A fast heartbeat  . A bad rash all over your body  . Dizziness and weakness    Immunizations Administered    Name Date Dose VIS Date Route   Pfizer COVID-19 Vaccine 05/13/2019 12:39 PM 0.3 mL 02/26/2019 Intramuscular   Manufacturer: [place]   Lot: [id number]   [place]: [address]-7

## 2019-05-14 ENCOUNTER — Other Ambulatory Visit: Payer: Self-pay

## 2019-05-14 ENCOUNTER — Ambulatory Visit (HOSPITAL_COMMUNITY)
Admission: RE | Admit: 2019-05-14 | Discharge: 2019-05-14 | Disposition: A | Discharge status: Home / Self Care | Payer: Medicare HMO | Source: Ambulatory Visit | Attending: Internal Medicine | Admitting: Internal Medicine

## 2019-05-14 VITALS — BP 114/74 | HR 95 | Wt 205.4 lb

## 2019-05-14 DIAGNOSIS — I2510 Atherosclerotic heart disease of native coronary artery without angina pectoris: Secondary | ICD-10-CM | POA: Insufficient documentation

## 2019-05-14 DIAGNOSIS — E039 Hypothyroidism, unspecified: Secondary | ICD-10-CM | POA: Diagnosis not present

## 2019-05-14 DIAGNOSIS — R42 Dizziness and giddiness: Secondary | ICD-10-CM | POA: Diagnosis not present

## 2019-05-14 DIAGNOSIS — M1990 Unspecified osteoarthritis, unspecified site: Secondary | ICD-10-CM | POA: Diagnosis not present

## 2019-05-14 DIAGNOSIS — Z7982 Long term (current) use of aspirin: Secondary | ICD-10-CM | POA: Insufficient documentation

## 2019-05-14 DIAGNOSIS — Z7901 Long term (current) use of anticoagulants: Secondary | ICD-10-CM | POA: Diagnosis not present

## 2019-05-14 DIAGNOSIS — I453 Trifascicular block: Secondary | ICD-10-CM | POA: Diagnosis not present

## 2019-05-14 DIAGNOSIS — N183 Chronic kidney disease, stage 3 unspecified: Secondary | ICD-10-CM

## 2019-05-14 DIAGNOSIS — I4510 Unspecified right bundle-branch block: Secondary | ICD-10-CM | POA: Insufficient documentation

## 2019-05-14 DIAGNOSIS — K219 Gastro-esophageal reflux disease without esophagitis: Secondary | ICD-10-CM | POA: Diagnosis not present

## 2019-05-14 DIAGNOSIS — I25118 Atherosclerotic heart disease of native coronary artery with other forms of angina pectoris: Secondary | ICD-10-CM

## 2019-05-14 DIAGNOSIS — I255 Ischemic cardiomyopathy: Secondary | ICD-10-CM | POA: Insufficient documentation

## 2019-05-14 DIAGNOSIS — H5461 Unqualified visual loss, right eye, normal vision left eye: Secondary | ICD-10-CM | POA: Diagnosis not present

## 2019-05-14 DIAGNOSIS — E785 Hyperlipidemia, unspecified: Secondary | ICD-10-CM | POA: Diagnosis not present

## 2019-05-14 DIAGNOSIS — E1122 Type 2 diabetes mellitus with diabetic chronic kidney disease: Secondary | ICD-10-CM | POA: Diagnosis not present

## 2019-05-14 DIAGNOSIS — Z79899 Other long term (current) drug therapy: Secondary | ICD-10-CM | POA: Diagnosis not present

## 2019-05-14 DIAGNOSIS — N1831 Chronic kidney disease, stage 3a: Secondary | ICD-10-CM | POA: Insufficient documentation

## 2019-05-14 DIAGNOSIS — Z955 Presence of coronary angioplasty implant and graft: Secondary | ICD-10-CM | POA: Insufficient documentation

## 2019-05-14 DIAGNOSIS — I5022 Chronic systolic (congestive) heart failure: Secondary | ICD-10-CM

## 2019-05-14 DIAGNOSIS — R0683 Snoring: Secondary | ICD-10-CM | POA: Diagnosis not present

## 2019-05-14 DIAGNOSIS — I130 Hypertensive heart and chronic kidney disease with heart failure and stage 1 through stage 4 chronic kidney disease, or unspecified chronic kidney disease: Secondary | ICD-10-CM | POA: Insufficient documentation

## 2019-05-14 DIAGNOSIS — I5042 Chronic combined systolic (congestive) and diastolic (congestive) heart failure: Secondary | ICD-10-CM | POA: Insufficient documentation

## 2019-05-14 DIAGNOSIS — Z794 Long term (current) use of insulin: Secondary | ICD-10-CM | POA: Diagnosis not present

## 2019-05-14 DIAGNOSIS — Z87891 Personal history of nicotine dependence: Secondary | ICD-10-CM | POA: Diagnosis not present

## 2019-05-14 DIAGNOSIS — M109 Gout, unspecified: Secondary | ICD-10-CM | POA: Diagnosis not present

## 2019-05-14 DIAGNOSIS — I480 Paroxysmal atrial fibrillation: Secondary | ICD-10-CM | POA: Diagnosis not present

## 2019-05-14 LAB — BASIC METABOLIC PANEL WITH GFR
Anion gap: 14 (ref 5–15)
BUN: 18 mg/dL (ref 8–23)
CO2: 25 mmol/L (ref 22–32)
Calcium: 9 mg/dL (ref 8.9–10.3)
Chloride: 98 mmol/L (ref 98–111)
Creatinine, Ser: 1.57 mg/dL — ABNORMAL HIGH (ref 0.61–1.24)
GFR calc Af Amer: 47 mL/min — ABNORMAL LOW (ref >60)
GFR calc non Af Amer: 40 mL/min — ABNORMAL LOW (ref >60)
Glucose, Bld: 115 mg/dL — ABNORMAL HIGH (ref 70–99)
Potassium: 3.4 mmol/L — ABNORMAL LOW (ref 3.5–5.1)
Sodium: 137 mmol/L (ref 135–145)

## 2019-05-14 LAB — BRAIN NATRIURETIC PEPTIDE: B Natriuretic Peptide: 82.3 pg/mL (ref 0.0–100.0)

## 2019-05-14 MED ORDER — FARXIGA 10 MG PO TABS: 10.0000 mg | ORAL_TABLET | Freq: Every day | ORAL | 11 refills | Status: DC

## 2019-05-14 NOTE — Progress Notes (Signed)
ADVANCED HF CLINIC CONSULT NOTE  Referring Physician: Dr. Saunders Revel Primary Cardiologist: Dr. Saunders Revel  HPI:  Lucas Caldwell is a 86 y.o. male with history of coronary artery disease,chronic systolic heart failure due to likelymixed ischemic and nonischemic cardiomyopathy, hypertension, hyperlipidemia, type 2 diabetes mellitus, paroxysmal atrial fibrillation, chronic kidney disease stage III, CML, iron deficiency anemia,GI bleed due to duodenal ulcers,BPH, and hypothyroidism who is referred by Dr. Saunders Revel for further evaluation of his HF.    Has h/o NICM previously followed by Dr. Carolynn Serve at [facility]. He had good response to medical therapy in 2012 EF was reported to be 50%   Had PCI of his RCA CAD s/p RCA stent x2 08/2016 at [facility].    Switched his care to Dr. Saunders Revel in 2019. Recently struggling with increasing DOE.   Echo 08/2018 LVEF 35-40%, indeterminite LV diastolic filling pressures, tricuspid AV, mildly dilated pulmonary artery, no significant valvular disfunction.   Cardiac cath 08/2018 70% LM stenosis with abnl FFR of 0.86, though drop in iFR was due to diffuse prox LAD disease and only small gradient involving ostial LM. Imdur was increased.   Repeat Cath 1/21 LM 60% LAD 70% with +FFR otherwise diffuse non-obstructive CAD -> PCI/DES LAD 1/21 - RA 10 RV 53/12 PA 53/30 (40) PCWP 28 CO/CI 4.2/2.1 PA sat 67%  Echo 04/22/19 EF 30-35%  Says he didn't see much benefit  in his DOE after stenting. Seen by Dr. Saunders Revel recently and his weight trended up 17 pounds in 1 week. ReDS vest reading has also increased from 31% to 37% in one week. He was instructed to increase his torsemide to 60 mg BID and continue the remainder of his goal directed medical therapy, including metoprolol succinate (dose limited by bifascicular block), hydralazine, and isosorbide mononitrate.  He has had problems with renal insufficiency in the past, limiting ACEI/ARB use in the past, though we may need to rechallenge him given worsening  HF and declining LVEF.   Here with his wife and son. Says his breathing is a little better. Says he has a hard time standing due to dizziness so it is hard to weigh himself at home. Weight down about 3-4 pounds here (205 lbs) from last week at Dr. Darnelle Bos (208-209lbs).  No edema, orthopnea or PND. + bendopnea. Wife reports snoring. Drinks 3 bottles of water per day. Tries to stay away from salty foods. Can walk around the house and do ADLs without too much problem. Can walk to mailbox (50 ft) without problem. Can't walk up steps. Rates himself 6/10. Recent HgBa1c  BNP 04/19/19 55  Review of Systems: [y] = yes, _0  = no   General: Weight gain Blue.Reese ]; Weight loss _1 ; Anorexia _2 ; Fatigue _3 ; Fever _4 ; Chills _5 ; Weakness _6   Cardiac: Chest pain/pressure _7 ; Resting SOB Blue.Reese ]; Exertional SOB [ y]; Orthopnea _8 ; Pedal Edema _9 ; Palpitations _10 ; Syncope _11 ; Presyncope _12 ; Paroxysmal nocturnal dyspnea_13   Pulmonary: Cough _14 ; Wheezing_15 ; Hemoptysis_16 ; Sputum _17 ; Snoring _18   GI: Vomiting_19 ; Dysphagia_20 ; Melena_21 ; Hematochezia _22 ; Heartburn_23 ; Abdominal pain _24 ; Constipation _25 ; Diarrhea _26 ; BRBPR _27   GU: Hematuria_28 ; Dysuria _29 ; Nocturia_30   Vascular: Pain in legs with walking _31 ; Pain in feet with lying flat _32 ; Non-healing sores _33 ; Stroke _34 ; TIA _35 ; Slurred speech _36 ;  Neuro: Headaches_37 ;  Vertigo_0 ; Seizures_1 ; Paresthesias_2 ;Blurred vision _3 ; Diplopia _4 ; Vision changes _5   Ortho/Skin: Arthritis Blue.Reese ]; Joint pain Blue.Reese ]; Muscle pain _6 ; Joint swelling _7 ; Back Pain _8 ; Rash _9   Psych: Depression_10 ; Anxiety_11   Heme: Bleeding problems _12 ; Clotting disorders _13 ; Anemia _14   Endocrine: Diabetes [ y]; Thyroid dysfunction_15    Past Medical History:  Diagnosis Date  . Arthritis    "probably in his legs" (08/15/2016)  . Blind right eye   . BPH (benign prostatic hyperplasia)   . Breast asymmetry    Left breast is larger, present for several years.  [name] CAD  (coronary artery disease)    a. Cath in the late 90's - reportedly ok;  b. 2014 s/p stenting x 2 @ [facility]; c 08/19/16 Cath/PCI with DES -> RCA, plan to treat LM 70% medically. Seen by surgery and felt to be too high risk for CABG; d. 08/2018 Cath: LM 70 (iFR 0.86), LAD 20ost, 40p, 50/14m D1 70, LCX patent stent, OM1 30, RCA patent stent, 451mSR, 40d, RPDA 30. PCWP 8. CO/CI 4.0/2.1.  . Marland Kitchenhronic combined systolic (congestive) and diastolic (congestive) heart failure ([facility] [place]   a. Previously reduced EF-->50% by echo in 2012;  b. 06/2015 Echo: EF 50-55%  c. 07/2016 Echo: EF 45-50%; d. 12/2017 Echo: EF 55%, Gr1 DD, mild MR; e. 08/2018 Echo: EF 35-40%, mildly dil PA.  . Marland Kitchenhronic kidney disease (CKD), stage IV (severe) (HCC[place]  . CML (chronic myelocytic leukemia) ([facility]  . GERD (gastroesophageal reflux disease)   . GIB (gastrointestinal bleeding)    a. 12/2017 3 unit PRBC GIB in setting of coumadin-->Endo/colon multiple duodenal ulcers and a single bleeding ulcer in the proximal ascending colon status post hemostatic clipping x2.  . Gout   . Hyperlipemia   . Hypertension   . Hypothyroidism   . Iron deficiency anemia   . Ischemic cardiomyopathy    a. Previously reduced EF-->50% by echo in 2012;  b. 06/2015 Echo: EF 50-55%, Gr2 DD, mild MR, mildly dil LA, Ao sclerosis, mild TR;  c. 07/2016 Echo: EF 45-50%; d. 12/2017 Echo: EF 55%, Gr1 DD, mild MR; e. 08/2018 Echo: EF 35-40%.  . Migraine    "in the 1960s" (08/15/2016)  . PAF (paroxysmal atrial fibrillation) (HCC)    a. ? Dx 2014-->s/p DCCV;  b. CHA2DS2VASc = 6--> Coumadin.  . Prostate cancer ([facility]  . RBBB   . Type II diabetes mellitus ([facility] [place]    Current Outpatient Medications  Medication Sig Dispense Refill  . ACCU-CHEK AVIVA PLUS test strip Use to check blood sugar up to 2 times daily 200 each 3  . Accu-Chek Softclix Lancets lancets Use to check blood sugar up to 2 times daily. 200 each 3  . acetaminophen (TYLENOL) 500 MG tablet Take 1,000 mg by mouth every 6  (six) hours as needed for moderate pain or headache.     . Alcohol Swabs 70 % PADS Use when checking blood sugar up to 3x daily 100 each 11  . allopurinol (ZYLOPRIM) 100 MG tablet TAKE ONE TABLET BY MOUTH ONCE DAILY 90 tablet 3  . aspirin 81 MG chewable tablet Chew 1 tablet (81 mg total) by mouth daily. 30 tablet 6  . Blood Glucose Monitoring Suppl (ACCU-CHEK AVIVA PLUS) w/Device KIT Use to check blood sugar up to 2 times daily 1 kit 0  . bosutinib (BOSULIF) 100 MG tablet Take 100 mg  by mouth at bedtime as needed (tired/not feeling well). Take with food.     . Dulaglutide (TRULICITY) 7.41 UL/8.4TX SOPN Inject 0.75 mg into the skin once a week. (Patient taking differently: Inject 0.75 mg into the skin every Saturday. ) 4 pen 0  . gabapentin (NEURONTIN) 300 MG capsule Take 2 capsules (600 mg total) by mouth 2 (two) times daily. Max dose = 1246m daily for kidney function 360 capsule 1  . hydrALAZINE (APRESOLINE) 50 MG tablet Take 1 tablet (50 mg total) by mouth 3 (three) times daily. 270 tablet 1  . Insulin Glargine ([name] KWIKPEN) 100 UNIT/ML SOPN Inject 38 Units into the skin daily.     . Insulin Pen Needle 31G X 5 MM MISC 31 g by Does not apply route as directed. 100 each 5  . isosorbide mononitrate (IMDUR) 60 MG 24 hr tablet TAKE 1 AND 1/2 TABLETS BY MOUTH ONCE DAILY 135 tablet 0  . levothyroxine (SYNTHROID) 25 MCG tablet TAKE 1 TABLET (25 MCG TOTAL) BY MOUTH DAILY BEFORE BREAKFAST. 90 tablet 1  . loperamide (IMODIUM A-D) 2 MG tablet Take 2 mg by mouth 3 (three) times daily as needed for diarrhea or loose stools.     . Menthol-Zinc Oxide (GOLD BOND EX) Apply 1 application topically daily as needed (muscle pain).    . metoprolol succinate (TOPROL-XL) 25 MG 24 hr tablet Take 1 tablet (25 mg total) by mouth daily. 30 tablet 1  . Multiple Vitamins-Minerals (MULTIVITAMIN ADULTS 50+ PO) Take 1 tablet by mouth every morning.    . nitroGLYCERIN (NITROSTAT) 0.4 MG SL tablet Place 1 tablet (0.4 mg total)  under the tongue every 5 (five) minutes x 3 doses as needed for chest pain. 25 tablet 4  . omeprazole (PRILOSEC) 20 MG capsule Take 20 mg by mouth 2 (two) times daily.    .[name] (SYSTANE OP) Place 1 drop into both eyes daily as needed (dry eyes).    . polyethylene glycol (MIRALAX / GLYCOLAX) 17 g packet Take 17 g by mouth daily as needed for mild constipation.     .Marland KitchenPROVENTIL HFA 108 (90 Base) MCG/ACT inhaler Inhale 2 puffs into the lungs every 6 (six) hours as needed for wheezing or shortness of breath.    . ranolazine (RANEXA) 500 MG 12 hr tablet TAKE ONE TABLET BY MOUTH TWICE DAILY 180 tablet 0  . rosuvastatin (CRESTOR) 5 MG tablet Take 1 tablet (5 mg total) by mouth daily. 90 tablet 3  . senna (SENOKOT) 8.6 MG tablet Take 1 tablet by mouth daily as needed for constipation.    . ticagrelor (BRILINTA) 90 MG TABS tablet Take 1 tablet (90 mg total) by mouth 2 (two) times daily. 60 tablet 6  . torsemide (DEMADEX) 20 MG tablet Take 3 tablets (60 mg total) by mouth 2 (two) times daily. 180 tablet 2  . traMADol (ULTRAM) 50 MG tablet Take 1 tablet (50 mg total) by mouth every 8 (eight) hours as needed for moderate pain. 30 tablet 2  . warfarin (COUMADIN) 3 MG tablet Take 3 mg by mouth as directed.     No current facility-administered medications for this encounter.    Allergies  Allergen Reactions  . Clopidogrel Anaphylaxis    altered mental status;  (electrolytes "out of wack" and confusion per family; THEY DO NOT REMEMBER ANY OTHER REACTION)- MSB 10/10/15  . Ciprofloxacin Itching  . Mirtazapine Diarrhea    Bad dreams   . Spironolactone  gynecomastia      Social History   Socioeconomic History  . Marital status: Married    Spouse name: Not on file  . Number of children: Not on file  . Years of education: Not on file  . Highest education level: 7th grade  Occupational History  . 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  Tobacco  Use  . Smoking status: Former Smoker    Packs/day: 2.00    Years: 15.00    Pack years: 30.00    Types: Cigarettes    Quit date: 1979    Years since quitting: 42.1  . Smokeless tobacco: Former Systems developer  . Tobacco comment: quit 1979  Substance and Sexual Activity  . Alcohol use: No    Comment: previously drank heavily - quit 1979.  . Drug use: No  . Sexual activity: Never  Other Topics Concern  . Not on file  Social History Narrative   Working on transportation truck part time    Social Determinants of Radio broadcast assistant Strain:   . Difficulty of Paying Living Expenses: Not on file  Food Insecurity:   . Worried About Charity fundraiser in the Last Year: Not on file  . Ran Out of Food in the Last Year: Not on file  Transportation Needs:   . Lack of Transportation (Medical): Not on file  . Lack of Transportation (Non-Medical): Not on file  Physical Activity:   . Days of Exercise per Week: Not on file  . Minutes of Exercise per Session: Not on file  Stress:   . Feeling of Stress : Not on file  Social Connections:   . Frequency of Communication with Friends and Family: Not on file  . Frequency of Social Gatherings with Friends and Family: Not on file  . Attends Religious Services: Not on file  . Active Member of Clubs or Organizations: Not on file  . Attends Archivist Meetings: Not on file  . Marital Status: Not on file  Intimate Partner Violence:   . Fear of Current or Ex-Partner: Not on file  . Emotionally Abused: Not on file  . Physically Abused: Not on file  . Sexually Abused: Not on file      Family History  Problem Relation Age of Onset  . Brain cancer Father   . Diabetes Sister   . Heart attack Sister     Vitals:   05/14/19 1356  BP: 114/74  Pulse: 95  SpO2: 96%  Weight: 93.2 kg (205 lb 6 oz)    PHYSICAL EXAM: General:  Well appearing. No respiratory difficulty HEENT: normal Neck: supple. no JVD. Carotids 2+ bilat; no bruits. No  lymphadenopathy or thryomegaly appreciated. Cor: PMI nondisplaced. Regular rate & rhythm. No rubs, gallops or murmurs. Lungs: clear Abdomen: soft, nontender, nondistended. No hepatosplenomegaly. No bruits or masses. Good bowel sounds. Extremities: no cyanosis, clubbing, rash, edema Neuro: alert & oriented x 3, cranial nerves grossly intact. moves all 4 extremities w/o difficulty. Affect pleasant.  ECG: NSR 94 with tirfascicular block. Personally reviewed    ASSESSMENT & PLAN:  1. Chronic systolic HF: - likely combination of iCM/NICM - EF 30-35% echo 2/21 (was 35-40% in 6/20) - NYHA III - Recent RHC with elevated filling pressures but BNP normal  - Volume status hard to assess on exam. ReDS just mildly elevated 37%-> 39% - Continue hydral 50 tid and imdur 90 daily - Continue Toprol 25 daily (dose reduced due to  trifascicular block) - Not on ARB/ARNI due to CKD - can consider in near future - Difficult situation. Suspect dyspnea is multifactorial (obesity, probable OSA, deconditioning, etc) and not just related to edema. That said recent RHC with PCWP 28 - Continue torsemide 60 tid (recently increased). Follow labs closely. Can add weekly metolazone as needed.  - Will add Farxiga 10  - Reinforced need for daily weights and reviewed use of sliding scale diuretics. We provided a scale today in clinic - We discussed possible cardiomems device to help manage.  - Refer to cardiac rehab.  - F/u in 2-3 weeks  - If EF doesn't recover, will need to weight pros/cons of ICD  2. CAD - cath 04/13/19 LAD 70% with +FFR otherwise diffuse non-obstructive CAD  - PCI/DES LAD 1/21 - followed by Dr. Saunders Revel - No current angina - On triple therapy. No bleeding.   3. CKD 3a - Creatinine 1.5-1.6.  - Watch closely with diuresis   4. DM  - start [place]  5. PAF - in NSR. Continue warfarin   6. Snoring - will need sleep study at some point  Glori Bickers, MD  2:32 PM

## 2019-05-14 NOTE — Patient Instructions (Signed)
START Farxiga 10 mg, one tab daily  Labs today We will only contact you if something comes back abnormal or we need to make some changes. Otherwise no news is good news!  You have been referred to Cardiac Rehab at [facility] They will be in contact with you to set up orientation  [phone]  Your physician recommends that you schedule a follow-up appointment in: 2-3 weeks  in the Advanced Practitioners (PA/NP) Clinic    Do the following things EVERYDAY: 1) Weigh yourself in the morning before breakfast. Write it down and keep it in a log. 2) Take your medicines as prescribed 3) Eat low salt foods--Limit salt (sodium) to 2000 mg per day.  4) Stay as active as you can everyday 5) Limit all fluids for the day to less than 2 liters  At the [facility], you and your health needs are our priority. As part of our continuing mission to provide you with exceptional heart care, we have created designated Provider Care Teams. These Care Teams include your primary Cardiologist (physician) and Advanced Practice Providers (APPs- Physician Assistants and Nurse Practitioners) who all work together to provide you with the care you need, when you need it.   You may see any of the following providers on your designated Care Team at your next follow up: [name] Dr Glori Bickers . Dr Loralie Champagne . Darrick Grinder, NP . Lyda Jester, PA . Audry Riles, PharmD   Please be sure to bring in all your medications bottles to every appointment.

## 2019-05-14 NOTE — Progress Notes (Signed)
ReDS Vest / Clip - 05/14/19 1400      ReDS Vest / Clip   Station Marker  A    Ruler Value  30    ReDS Value Range  Moderate volume overload    ReDS Actual Value  39

## 2019-05-18 ENCOUNTER — Telehealth (HOSPITAL_COMMUNITY): Payer: Self-pay

## 2019-05-18 MED ORDER — POTASSIUM CHLORIDE CRYS ER 20 MEQ PO TBCR: 20.0000 meq | EXTENDED_RELEASE_TABLET | Freq: Every day | ORAL | 3 refills | Status: DC

## 2019-05-18 NOTE — Telephone Encounter (Signed)
Called and reviewed lab work and medication changes. Reviewed upcoming appointment with [name]. No further questions.

## 2019-05-18 NOTE — Telephone Encounter (Signed)
-----   Message from Jolaine Artist, MD sent at 05/16/2019 10:56 PM EST ----- Add potassium 20 meq daily. Take 3 tabs on the first day.

## 2019-05-19 ENCOUNTER — Ambulatory Visit (INDEPENDENT_AMBULATORY_CARE_PROVIDER_SITE_OTHER): Payer: Medicare HMO

## 2019-05-19 ENCOUNTER — Encounter: Payer: Medicare HMO | Attending: Internal Medicine | Admitting: *Deleted

## 2019-05-19 ENCOUNTER — Encounter: Payer: Self-pay | Admitting: *Deleted

## 2019-05-19 ENCOUNTER — Other Ambulatory Visit: Payer: Self-pay

## 2019-05-19 DIAGNOSIS — K219 Gastro-esophageal reflux disease without esophagitis: Secondary | ICD-10-CM | POA: Insufficient documentation

## 2019-05-19 DIAGNOSIS — Z79899 Other long term (current) drug therapy: Secondary | ICD-10-CM | POA: Insufficient documentation

## 2019-05-19 DIAGNOSIS — Z794 Long term (current) use of insulin: Secondary | ICD-10-CM | POA: Insufficient documentation

## 2019-05-19 DIAGNOSIS — Z7901 Long term (current) use of anticoagulants: Secondary | ICD-10-CM | POA: Insufficient documentation

## 2019-05-19 DIAGNOSIS — E119 Type 2 diabetes mellitus without complications: Secondary | ICD-10-CM | POA: Insufficient documentation

## 2019-05-19 DIAGNOSIS — I5042 Chronic combined systolic (congestive) and diastolic (congestive) heart failure: Secondary | ICD-10-CM | POA: Insufficient documentation

## 2019-05-19 DIAGNOSIS — I110 Hypertensive heart disease with heart failure: Secondary | ICD-10-CM | POA: Insufficient documentation

## 2019-05-19 DIAGNOSIS — Z5181 Encounter for therapeutic drug level monitoring: Secondary | ICD-10-CM

## 2019-05-19 DIAGNOSIS — Z87891 Personal history of nicotine dependence: Secondary | ICD-10-CM | POA: Insufficient documentation

## 2019-05-19 DIAGNOSIS — I480 Paroxysmal atrial fibrillation: Secondary | ICD-10-CM

## 2019-05-19 DIAGNOSIS — E039 Hypothyroidism, unspecified: Secondary | ICD-10-CM | POA: Insufficient documentation

## 2019-05-19 DIAGNOSIS — N400 Enlarged prostate without lower urinary tract symptoms: Secondary | ICD-10-CM | POA: Insufficient documentation

## 2019-05-19 DIAGNOSIS — E785 Hyperlipidemia, unspecified: Secondary | ICD-10-CM | POA: Insufficient documentation

## 2019-05-19 DIAGNOSIS — Z7982 Long term (current) use of aspirin: Secondary | ICD-10-CM | POA: Insufficient documentation

## 2019-05-19 LAB — POCT INR: INR: 1.3 — AB (ref 2.0–3.0)

## 2019-05-19 MED ORDER — WARFARIN SODIUM 3 MG PO TABS: 3.0000 mg | ORAL_TABLET | ORAL | 1 refills | Status: DC

## 2019-05-19 NOTE — Patient Instructions (Addendum)
-   take 2 tablets today and tomorrow - then on Friday resume previous dosage of 1 TABLET EVERY DAY EXCEPT 1.5 TABLETS ON [place]. - recheck INR in 1 week.

## 2019-05-19 NOTE — Progress Notes (Signed)
Completed virtual orientation today.  EP evaluation is scheduled for Thursday 3/4 at 2pm.  Documentation for diagnosis can be found in [facility] encounter 05/14/19.  Pt had oriented on 02/26/19 but never came in for his walk test for various reasons.  Now he is coming in for Cardiac Rehab.

## 2019-05-19 NOTE — Progress Notes (Signed)
Pt was coming out of the bathroom as I went to get him out of the waiting room.  Pt was audibly SOB while in wheelchair.  Pt requests to see Dr. Saunders Revel, as he "went to that doctor in [place], but this SOB isn't any better".  Advised pt that he has to stay on Brilinta until his INR is therapeutic.  [name] 1.3 today, boosted him for 2 days and asked him to return in 1 week.  Pt refuses & requests 2 weeks, as "it costs too much gas money to keep driving up here, I live in [place]".  After some discussion, pt's wife convinced him to come in for recheck next week.

## 2019-05-20 VITALS — Ht 67.0 in | Wt 206.0 lb

## 2019-05-20 DIAGNOSIS — Z794 Long term (current) use of insulin: Secondary | ICD-10-CM | POA: Diagnosis not present

## 2019-05-20 DIAGNOSIS — K219 Gastro-esophageal reflux disease without esophagitis: Secondary | ICD-10-CM | POA: Diagnosis not present

## 2019-05-20 DIAGNOSIS — H18599 Other hereditary corneal dystrophies, unspecified eye: Secondary | ICD-10-CM | POA: Diagnosis not present

## 2019-05-20 DIAGNOSIS — E039 Hypothyroidism, unspecified: Secondary | ICD-10-CM | POA: Diagnosis not present

## 2019-05-20 DIAGNOSIS — N400 Enlarged prostate without lower urinary tract symptoms: Secondary | ICD-10-CM | POA: Diagnosis not present

## 2019-05-20 DIAGNOSIS — I5042 Chronic combined systolic (congestive) and diastolic (congestive) heart failure: Secondary | ICD-10-CM

## 2019-05-20 DIAGNOSIS — E119 Type 2 diabetes mellitus without complications: Secondary | ICD-10-CM | POA: Diagnosis not present

## 2019-05-20 DIAGNOSIS — Z7901 Long term (current) use of anticoagulants: Secondary | ICD-10-CM | POA: Diagnosis not present

## 2019-05-20 DIAGNOSIS — H35342 Macular cyst, hole, or pseudohole, left eye: Secondary | ICD-10-CM | POA: Diagnosis not present

## 2019-05-20 DIAGNOSIS — Z87891 Personal history of nicotine dependence: Secondary | ICD-10-CM | POA: Diagnosis not present

## 2019-05-20 DIAGNOSIS — Z79899 Other long term (current) drug therapy: Secondary | ICD-10-CM | POA: Diagnosis not present

## 2019-05-20 DIAGNOSIS — I5040 Unspecified combined systolic (congestive) and diastolic (congestive) heart failure: Secondary | ICD-10-CM | POA: Diagnosis present

## 2019-05-20 DIAGNOSIS — I110 Hypertensive heart disease with heart failure: Secondary | ICD-10-CM | POA: Diagnosis not present

## 2019-05-20 DIAGNOSIS — I480 Paroxysmal atrial fibrillation: Secondary | ICD-10-CM | POA: Diagnosis not present

## 2019-05-20 DIAGNOSIS — E785 Hyperlipidemia, unspecified: Secondary | ICD-10-CM | POA: Diagnosis not present

## 2019-05-20 DIAGNOSIS — Z7982 Long term (current) use of aspirin: Secondary | ICD-10-CM | POA: Diagnosis not present

## 2019-05-20 DIAGNOSIS — H35372 Puckering of macula, left eye: Secondary | ICD-10-CM | POA: Diagnosis not present

## 2019-05-20 LAB — HM DIABETES EYE EXAM

## 2019-05-20 NOTE — Progress Notes (Signed)
Cardiac Individual Treatment Plan  Patient Details  Name: Lucas Caldwell MRN: [id number] Date of Birth: 1933/12/17 Referring Provider:     Cardiac Rehab from 05/20/2019 in [facility] Cardiac and Pulmonary Rehab  Referring Provider  Bensimhon      Initial Encounter Date:    Cardiac Rehab from 05/20/2019 in [facility] Cardiac and Pulmonary Rehab  Date  05/20/19      Visit Diagnosis: Chronic combined systolic and diastolic congestive heart failure ([place])  Patient's Home Medications on Admission:  Current Outpatient Medications:  .  ACCU-CHEK AVIVA PLUS test strip, Use to check blood sugar up to 2 times daily, Disp: 200 each, Rfl: 3 .  Accu-Chek Softclix Lancets lancets, Use to check blood sugar up to 2 times daily., Disp: 200 each, Rfl: 3 .  acetaminophen (TYLENOL) 500 MG tablet, Take 1,000 mg by mouth every 6 (six) hours as needed for moderate pain or headache. , Disp: , Rfl:  .  Alcohol Swabs 70 % PADS, Use when checking blood sugar up to 3x daily, Disp: 100 each, Rfl: 11 .  allopurinol (ZYLOPRIM) 100 MG tablet, TAKE ONE TABLET BY MOUTH ONCE DAILY (Patient not taking: Reported on 05/19/2019), Disp: 90 tablet, Rfl: 3 .  aspirin 81 MG chewable tablet, Chew 1 tablet (81 mg total) by mouth daily., Disp: 30 tablet, Rfl: 6 .  Blood Glucose Monitoring Suppl (ACCU-CHEK AVIVA PLUS) w/Device KIT, Use to check blood sugar up to 2 times daily, Disp: 1 kit, Rfl: 0 .  bosutinib (BOSULIF) 100 MG tablet, Take 100 mg by mouth at bedtime as needed (tired/not feeling well). Take with food. , Disp: , Rfl:  .  dapagliflozin propanediol (FARXIGA) 10 MG TABS tablet, Take 10 mg by mouth daily before breakfast., Disp: 30 tablet, Rfl: 11 .  Dulaglutide (TRULICITY) 2.50 NL/9.7QB SOPN, Inject 0.75 mg into the skin once a week. (Patient taking differently: Inject 0.75 mg into the skin every Saturday. ), Disp: 4 pen, Rfl: 0 .  gabapentin (NEURONTIN) 300 MG capsule, Take 2 capsules (600 mg total) by mouth 2 (two) times daily. Max  dose = 1234m daily for kidney function, Disp: 360 capsule, Rfl: 1 .  hydrALAZINE (APRESOLINE) 50 MG tablet, Take 1 tablet (50 mg total) by mouth 3 (three) times daily., Disp: 270 tablet, Rfl: 1 .  Insulin Glargine ([name] KWIKPEN) 100 UNIT/ML SOPN, Inject 38 Units into the skin daily. , Disp: , Rfl:  .  Insulin Pen Needle 31G X 5 MM MISC, 31 g by Does not apply route as directed., Disp: 100 each, Rfl: 5 .  isosorbide mononitrate (IMDUR) 60 MG 24 hr tablet, TAKE 1 AND 1/2 TABLETS BY MOUTH ONCE DAILY, Disp: 135 tablet, Rfl: 0 .  levothyroxine (SYNTHROID) 25 MCG tablet, TAKE 1 TABLET (25 MCG TOTAL) BY MOUTH DAILY BEFORE BREAKFAST., Disp: 90 tablet, Rfl: 1 .  loperamide (IMODIUM A-D) 2 MG tablet, Take 2 mg by mouth 3 (three) times daily as needed for diarrhea or loose stools. , Disp: , Rfl:  .  Menthol-Zinc Oxide (GOLD BOND EX), Apply 1 application topically daily as needed (muscle pain)., Disp: , Rfl:  .  metoprolol succinate (TOPROL-XL) 25 MG 24 hr tablet, Take 1 tablet (25 mg total) by mouth daily., Disp: 30 tablet, Rfl: 1 .  Multiple Vitamins-Minerals (MULTIVITAMIN ADULTS 50+ PO), Take 1 tablet by mouth every morning., Disp: , Rfl:  .  nitroGLYCERIN (NITROSTAT) 0.4 MG SL tablet, Place 1 tablet (0.4 mg total) under the tongue every 5 (five) minutes x  3 doses as needed for chest pain., Disp: 25 tablet, Rfl: 4 .  omeprazole (PRILOSEC) 20 MG capsule, Take 20 mg by mouth 2 (two) times daily., Disp: , Rfl:  .  Polyethyl Glycol-Propyl Glycol (SYSTANE OP), Place 1 drop into both eyes daily as needed (dry eyes)., Disp: , Rfl:  .  polyethylene glycol (MIRALAX / GLYCOLAX) 17 g packet, Take 17 g by mouth daily as needed for mild constipation. , Disp: , Rfl:  .  potassium chloride SA (KLOR-CON) 20 MEQ tablet, Take 1 tablet (20 mEq total) by mouth daily. Take 3 tablets the first day only., Disp: 90 tablet, Rfl: 3 .  PROVENTIL HFA 108 (90 Base) MCG/ACT inhaler, Inhale 2 puffs into the lungs every 6 (six) hours as  needed for wheezing or shortness of breath., Disp: , Rfl:  .  ranolazine (RANEXA) 500 MG 12 hr tablet, TAKE ONE TABLET BY MOUTH TWICE DAILY, Disp: 180 tablet, Rfl: 0 .  rosuvastatin (CRESTOR) 5 MG tablet, Take 1 tablet (5 mg total) by mouth daily., Disp: 90 tablet, Rfl: 3 .  senna (SENOKOT) 8.6 MG tablet, Take 1 tablet by mouth daily as needed for constipation., Disp: , Rfl:  .  ticagrelor (BRILINTA) 90 MG TABS tablet, Take 1 tablet (90 mg total) by mouth 2 (two) times daily., Disp: 60 tablet, Rfl: 6 .  torsemide (DEMADEX) 20 MG tablet, Take 3 tablets (60 mg total) by mouth 2 (two) times daily., Disp: 180 tablet, Rfl: 2 .  traMADol (ULTRAM) 50 MG tablet, Take 1 tablet (50 mg total) by mouth every 8 (eight) hours as needed for moderate pain. (Patient not taking: Reported on 05/19/2019), Disp: 30 tablet, Rfl: 2 .  warfarin (COUMADIN) 3 MG tablet, Take 1 tablet (3 mg total) by mouth as directed., Disp: 45 tablet, Rfl: 1  Past Medical History: Past Medical History:  Diagnosis Date  . Arthritis    "probably in his legs" (08/15/2016)  . Blind right eye   . BPH (benign prostatic hyperplasia)   . Breast asymmetry    Left breast is larger, present for several years.  [name] CAD (coronary artery disease)    a. Cath in the late 90's - reportedly ok;  b. 2014 s/p stenting x 2 @ [facility]; c 08/19/16 Cath/PCI with DES -> RCA, plan to treat LM 70% medically. Seen by surgery and felt to be too high risk for CABG; d. 08/2018 Cath: LM 70 (iFR 0.86), LAD 20ost, 40p, 50/30m D1 70, LCX patent stent, OM1 30, RCA patent stent, 484mSR, 40d, RPDA 30. PCWP 8. CO/CI 4.0/2.1.  . Marland Kitchenhronic combined systolic (congestive) and diastolic (congestive) heart failure ([place]   a. Previously reduced EF-->50% by echo in 2012;  b. 06/2015 Echo: EF 50-55%  c. 07/2016 Echo: EF 45-50%; d. 12/2017 Echo: EF 55%, Gr1 DD, mild MR; e. 08/2018 Echo: EF 35-40%, mildly dil PA.  . Mar[place] Kitchenhronic kidney disease (CKD), stage IV (severe) ([facility]  . CML (chronic myelocytic  leukemia) ([facility]  . GERD (gastroesophageal reflux disease)   . GIB (gastrointestinal bleeding)    a. 12/2017 3 unit PRBC GIB in setting of coumadin-->Endo/colon multiple duodenal ulcers and a single bleeding ulcer in the proximal ascending colon status post hemostatic clipping x2.  . Gout   . Hyperlipemia   . Hypertension   . Hypothyroidism   . Iron deficiency anemia   . Ischemic cardiomyopathy    a. Previously reduced EF-->50% by echo in 2012;  b. 06/2015 Echo: EF 50-55%,  Gr2 DD, mild MR, mildly dil LA, Ao sclerosis, mild TR;  c. 07/2016 Echo: EF 45-50%; d. 12/2017 Echo: EF 55%, Gr1 DD, mild MR; e. 08/2018 Echo: EF 35-40%.  . Migraine    "in the 1960s" (08/15/2016)  . PAF (paroxysmal atrial fibrillation) (HCC)    a. ? Dx 2014-->s/p DCCV;  b. CHA2DS2VASc = 6--> Coumadin.  . Prostate cancer ([place])   . RBBB   . Type II diabetes mellitus (HCC)     Tobacco Use: Social History   Tobacco Use  Smoking Status Former Smoker  . Packs/day: 2.00  . Years: 15.00  . Pack years: 30.00  . Types: Cigarettes  . Quit date: 54  . Years since quitting: 42.2  Smokeless Tobacco Former Systems developer  Tobacco Comment   quit 1979    Labs: Recent Review [facility] Cardiac [facility] Pul[facility] Re[facility] Latest Ref Rng & Units 05/06/2018 08/24/2018 08/28/2018 02/02/2019 04/13/2019   Cholestrol 0 - 200 mg/dL - - 110 - -   LDLCALC 0 - 99 mg/dL - - 51 - -   HDL >40 mg/dL - - 28(L) - -   Trlycerides <150 mg/dL - - 155(H) - -   Hemoglobin A1c 4.8 - 5.6 % 8.7(A) 8.3(A) - 6.6(A) 7.6(H)       Exercise Target Goals: Exercise Program Goal: Individual exercise prescription set using results from initial 6 min walk test and THRR while considering  patient's activity barriers and safety.   Exercise Prescription Goal: Initial exercise prescription builds to 30-45 minutes a day of aerobic activity, 2-3 days per week.  Home exercise guidelines will be given to patient during program as part of exercise prescription  that the participant will acknowledge.  Activity Barriers & Risk Stratification: Activity Barriers & Cardiac Risk Stratification - 05/19/19 0913      Activity Barriers & Cardiac Risk Stratification   Activity Barriers  Arthritis;Joint Problems;Deconditioning;Muscular Weakness;Shortness of Breath;Assistive Device   knee pain, uses walker   Cardiac Risk Stratification  High       6 Minute Walk: 6 Minute Walk    Row Name 05/20/19 1539         6 Minute Walk   Distance  715 feet     Walk Time  5.35 minutes     # of Rest Breaks  1     MPH  1.54     METS  1.14     RPE  15     Perceived Dyspnea   2     VO2 Peak  4     Symptoms  Yes (comment)     Comments  legs "going to sleep" from knees down     Resting HR  95 bpm     Resting BP  114/70     Resting Oxygen Saturation   98 %     Exercise Oxygen Saturation  during 6 min walk  92 %     Max Ex. HR  107 bpm     Max Ex. BP  154/68     2 Minute Post BP  124/68        Oxygen Initial Assessment: Oxygen Initial Assessment - 02/26/19 1119      Home Oxygen   Home Oxygen Device  None    Sleep Oxygen Prescription  None    Home Exercise Oxygen Prescription  None    Home at Rest Exercise Oxygen Prescription  None      Initial 6 min  Walk   Oxygen Used  None      Program Oxygen Prescription   Program Oxygen Prescription  None      Intervention   Short Term Goals  To learn and understand importance of monitoring SPO2 with pulse oximeter and demonstrate accurate use of the pulse oximeter.;To learn and understand importance of maintaining oxygen saturations>88%;To learn and demonstrate proper pursed lip breathing techniques or other breathing techniques.    Long  Term Goals  Verbalizes importance of monitoring SPO2 with pulse oximeter and return demonstration;Maintenance of O2 saturations>88%;Exhibits proper breathing techniques, such as pursed lip breathing or other method taught during program session;Compliance with respiratory  medication       Oxygen Re-Evaluation:   Oxygen Discharge (Final Oxygen Re-Evaluation):   Initial Exercise Prescription: Initial Exercise Prescription - 05/20/19 1500      Date of Initial Exercise RX and Referring Provider   Date  05/20/19    Referring Provider  Bensimhon      Treadmill   MPH  1    Grade  0    Minutes  15      REL-XR   Level  1    Speed  50    Minutes  15    METs  1      Prescription Details   Frequency (times per week)  3    Duration  Progress to 30 minutes of continuous aerobic without signs/symptoms of physical distress      Intensity   THRR 40-80% of Max Heartrate  111-128    Ratings of Perceived Exertion  11-13    Perceived Dyspnea  0-4      Resistance Training   Training Prescription  Yes    Weight  3 lb    Reps  10-15       Perform Capillary Blood Glucose checks as needed.  Exercise Prescription Changes: Exercise Prescription Changes    Row Name 05/20/19 1500             Response to Exercise   Blood Pressure (Admit)  114/70       Blood Pressure (Exercise)  154/68       Blood Pressure (Exit)  124/68       Heart Rate (Admit)  95 bpm       Heart Rate (Exercise)  107 bpm       Heart Rate (Exit)  90 bpm       Oxygen Saturation (Admit)  98 %       Oxygen Saturation (Exercise)  92 %       Oxygen Saturation (Exit)  96 %       Rating of Perceived Exertion (Exercise)  15       Perceived Dyspnea (Exercise)  2       Symptoms  legs "going to sleep"       Comments  L shoulder old injury          Exercise Comments:   Exercise Goals and Review: Exercise Goals    Row Name 05/20/19 1544             Exercise Goals   Increase Physical Activity  Yes       Intervention  Provide advice, education, support and counseling about physical activity/exercise needs.;Develop an individualized exercise prescription for aerobic and resistive training based on initial evaluation findings, risk stratification, comorbidities and participant's  personal goals.       Expected Outcomes  Short Term: Attend rehab on a regular  basis to increase amount of physical activity.;Long Term: Add in home exercise to make exercise part of routine and to increase amount of physical activity.;Long Term: Exercising regularly at least 3-5 days a week.       Increase Strength and Stamina  Yes       Intervention  Provide advice, education, support and counseling about physical activity/exercise needs.;Develop an individualized exercise prescription for aerobic and resistive training based on initial evaluation findings, risk stratification, comorbidities and participant's personal goals.       Expected Outcomes  Short Term: Increase workloads from initial exercise prescription for resistance, speed, and METs.;Short Term: Perform resistance training exercises routinely during rehab and add in resistance training at home;Long Term: Improve cardiorespiratory fitness, muscular endurance and strength as measured by increased METs and functional capacity (6MWT)       Able to understand and use rate of perceived exertion (RPE) scale  Yes       Intervention  Provide education and explanation on how to use RPE scale       Expected Outcomes  Short Term: Able to use RPE daily in rehab to express subjective intensity level;Long Term:  Able to use RPE to guide intensity level when exercising independently       Able to understand and use Dyspnea scale  Yes       Intervention  Provide education and explanation on how to use Dyspnea scale       Expected Outcomes  Short Term: Able to use Dyspnea scale daily in rehab to express subjective sense of shortness of breath during exertion;Long Term: Able to use Dyspnea scale to guide intensity level when exercising independently       Knowledge and understanding of Target Heart Rate Range (THRR)  Yes       Intervention  Provide education and explanation of THRR including how the numbers were predicted and where they are located for  reference       Expected Outcomes  Short Term: Able to state/look up THRR;Short Term: Able to use daily as guideline for intensity in rehab;Long Term: Able to use THRR to govern intensity when exercising independently       Able to check pulse independently  Yes       Intervention  Provide education and demonstration on how to check pulse in carotid and radial arteries.;Review the importance of being able to check your own pulse for safety during independent exercise       Expected Outcomes  Short Term: Able to explain why pulse checking is important during independent exercise;Long Term: Able to check pulse independently and accurately       Understanding of Exercise Prescription  Yes       Intervention  Provide education, explanation, and written materials on patient's individual exercise prescription       Expected Outcomes  Short Term: Able to explain program exercise prescription;Long Term: Able to explain home exercise prescription to exercise independently          Exercise Goals Re-Evaluation :   Discharge Exercise Prescription (Final Exercise Prescription Changes): Exercise Prescription Changes - 05/20/19 1500      Response to Exercise   Blood Pressure (Admit)  114/70    Blood Pressure (Exercise)  154/68    Blood Pressure (Exit)  124/68    Heart Rate (Admit)  95 bpm    Heart Rate (Exercise)  107 bpm    Heart Rate (Exit)  90 bpm    Oxygen Saturation (Admit)  98 %    Oxygen Saturation (Exercise)  92 %    Oxygen Saturation (Exit)  96 %    Rating of Perceived Exertion (Exercise)  15    Perceived Dyspnea (Exercise)  2    Symptoms  legs "going to sleep"    Comments  L shoulder old injury       Nutrition:  Target Goals: Understanding of nutrition guidelines, daily intake of sodium <1542m, cholesterol <2052m calories 30% from fat and 7% or less from saturated fats, daily to have 5 or more servings of fruits and vegetables.  Biometrics: Pre Biometrics - 05/20/19 1545       Pre Biometrics   Height  '5\' 7"'  (1.702 m)    Weight  206 lb (93.4 kg)    BMI (Calculated)  32.26        Nutrition Therapy Plan and Nutrition Goals:   Nutrition Assessments:   Nutrition Goals Re-Evaluation:   Nutrition Goals Discharge (Final Nutrition Goals Re-Evaluation):   Psychosocial: Target Goals: Acknowledge presence or absence of significant depression and/or stress, maximize coping skills, provide positive support system. Participant is able to verbalize types and ability to use techniques and skills needed for reducing stress and depression.   Initial Review & Psychosocial Screening: Initial Psych Review & Screening - 05/19/19 0914      Initial Review   Current issues with  Current Stress Concerns    Source of Stress Concerns  Chronic Illness;Unable to participate in former interests or hobbies;Unable to perform yard/household activities    Comments  Wants more energy to do more, less SOB, wearing oxygen and using cane      Family Dynamics   Good Support System?  Yes   wife, son, daughter     Barriers   Psychosocial barriers to participate in program  Psychosocial barriers identified (see note);The patient should benefit from training in stress management and relaxation.      Screening Interventions   Interventions  Encouraged to exercise;To provide support and resources with identified psychosocial needs;Provide feedback about the scores to participant    Expected Outcomes  Short Term goal: Utilizing psychosocial counselor, staff and physician to assist with identification of specific Stressors or current issues interfering with healing process. Setting desired goal for each stressor or current issue identified.;Long Term Goal: Stressors or current issues are controlled or eliminated.;Short Term goal: Identification and review with participant of any Quality of Life or Depression concerns found by scoring the questionnaire.;Long Term goal: The participant improves  quality of Life and PHQ9 Scores as seen by post scores and/or verbalization of changes       Quality of Life Scores:  Quality of Life - 05/20/19 1545      Quality of Life   Select  Quality of Life      Quality of Life Scores   Health/Function Pre  20.14 %    Socioeconomic Pre  30 %    Psych/Spiritual Pre  30 %    Family Pre  30 %    GLOBAL Pre  25.94 %      Scores of 19 and below usually indicate a poorer quality of life in these areas.  A difference of  2-3 points is a clinically meaningful difference.  A difference of 2-3 points in the total score of the Quality of Life Index has been associated with significant improvement in overall quality of life, self-image, physical symptoms, and general health in studies assessing change in quality of life.  PHQ-9:  Recent Review Flowsheet Data    Depression screen [facility] 2/9 05/20/2019 02/02/2019 11/24/2018 11/04/2018 08/24/2018   Decreased Interest 1 0 0 0 0   Down, Depressed, Hopeless 2 0 0 0 0   PHQ - 2 Score 3 0 0 0 0   Altered sleeping 2 0 - - -   Tired, decreased energy 3 0 - - -   Change in appetite 2 0 - - -   Feeling bad or failure about yourself  2 0 - - -   Trouble concentrating 2 0 - - -   Moving slowly or fidgety/restless 2 0 - - -   Suicidal thoughts 0 0 - - -   PHQ-9 Score 16 0 - - -   Difficult doing work/chores Somewhat difficult - - - -     Interpretation of Total Score  Total Score Depression Severity:  1-4 = Minimal depression, 5-9 = Mild depression, 10-14 = Moderate depression, 15-19 = Moderately severe depression, 20-27 = Severe depression   Psychosocial Evaluation and Intervention: Psychosocial Evaluation - 05/19/19 0922      Psychosocial Evaluation & Interventions   Interventions  Encouraged to exercise with the program and follow exercise prescription;Stress management education    Comments  Mr. Gunderson has bene referred for Cardiac Rehab this time as his EF has decreased.  [name] currently on oxygen at home and  using a walker to get around.  He does not have portable oxygen at this time, but we will use it in class.  [name] not sure if he is able to come with being hooked up with everything. He was encouraged that this was the very reason that he needed to attend the program.  His goals are to be able to get back and moving again like he was last year.  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  He has a strong support system in his wife and kids.  His wife manages his medications and appointments.    Expected Outcomes  Short: Attend rehab regularly to build stamina and improve breating. Long:Improve stamina.    Continue Psychosocial Services   Follow up required by staff       Psychosocial Re-Evaluation:   Psychosocial Discharge (Final Psychosocial Re-Evaluation):   Vocational Rehabilitation: Provide vocational rehab assistance to qualifying candidates.   Vocational Rehab Evaluation & Intervention: Vocational Rehab - 05/19/19 0913      Initial Vocational Rehab Evaluation & Intervention   Assessment shows need for Vocational Rehabilitation  No   retired      Education: Education Goals: Education classes will be provided on a variety of topics geared toward better understanding of heart health and risk factor modification. Participant will state understanding/return demonstration of topics presented as noted by education test scores.  Learning Barriers/Preferences: Learning Barriers/Preferences - 05/19/19 0914      Learning Barriers/Preferences   Learning Barriers  Sight;Hearing   wears glasses, HOH   Learning Preferences  None       Education Topics:  AED/CPR: - Group verbal and written instruction with the use of models to demonstrate the basic use of the AED with the basic ABC's of resuscitation.   General Nutrition Guidelines/Fats and Fiber: -Group instruction provided by verbal, written material, models and posters to present the general guidelines for heart healthy  nutrition. Gives an explanation and review of dietary fats and fiber.   Controlling Sodium/Reading Food Labels: -Group verbal and written material supporting the discussion of sodium  use in heart healthy nutrition. Review and explanation with models, verbal and written materials for utilization of the food label.   Exercise Physiology & General Exercise Guidelines: - Group verbal and written instruction with models to review the exercise physiology of the cardiovascular system and associated critical values. Provides general exercise guidelines with specific guidelines to those with heart or lung disease.    Aerobic Exercise & Resistance Training: - Gives group verbal and written instruction on the various components of exercise. Focuses on aerobic and resistive training programs and the benefits of this training and how to safely progress through these programs..   Flexibility, Balance, Mind/Body Relaxation: Provides group verbal/written instruction on the benefits of flexibility and balance training, including mind/body exercise modes such as yoga, pilates and tai chi.  Demonstration and skill practice provided.   Stress and Anxiety: - Provides group verbal and written instruction about the health risks of elevated stress and causes of high stress.  Discuss the correlation between heart/lung disease and anxiety and treatment options. Review healthy ways to manage with stress and anxiety.   Depression: - Provides group verbal and written instruction on the correlation between heart/lung disease and depressed mood, treatment options, and the stigmas associated with seeking treatment.   Anatomy & Physiology of the Heart: - Group verbal and written instruction and models provide basic cardiac anatomy and physiology, with the coronary electrical and arterial systems. Review of Valvular disease and Heart Failure   Cardiac Procedures: - Group verbal and written instruction to review  commonly prescribed medications for heart disease. Reviews the medication, class of the drug, and side effects. Includes the steps to properly store meds and maintain the prescription regimen. (beta blockers and nitrates)   Cardiac Medications I: - Group verbal and written instruction to review commonly prescribed medications for heart disease. Reviews the medication, class of the drug, and side effects. Includes the steps to properly store meds and maintain the prescription regimen.   Cardiac Medications II: -Group verbal and written instruction to review commonly prescribed medications for heart disease. Reviews the medication, class of the drug, and side effects. (all other drug classes)    Go Sex-Intimacy & Heart Disease, Get SMART - Goal Setting: - Group verbal and written instruction through game format to discuss heart disease and the return to sexual intimacy. Provides group verbal and written material to discuss and apply goal setting through the application of the S.M.A.R.T. Method.   Other Matters of the Heart: - Provides group verbal, written materials and models to describe Stable Angina and Peripheral Artery. Includes description of the disease process and treatment options available to the cardiac patient.   Exercise & Equipment Safety: - Individual verbal instruction and demonstration of equipment use and safety with use of the equipment.   Cardiac Rehab from 05/20/2019 in [facility]  Date  05/20/19  Educator  AS  Instruction Review Code  1- Verbalizes Understanding      Infection Prevention: - Provides verbal and written material to individual with discussion of infection control including proper hand washing and proper equipment cleaning during exercise session.   Cardiac Rehab from 05/20/2019 in [facility]  Date  05/20/19  Educator  AS  Instruction Review Code  1- Verbalizes Understanding      Falls Prevention: -  Provides verbal and written material to individual with discussion of falls prevention and safety.   Cardiac Rehab from 05/20/2019 in [facility]  Date  05/20/19  Educator  AS  Instruction Review Code  1- Verbalizes Understanding      Diabetes: - Individual verbal and written instruction to review signs/symptoms of diabetes, desired ranges of glucose level fasting, after meals and with exercise. Acknowledge that pre and post exercise glucose checks will be done for 3 sessions at entry of program.   Know Your Numbers and Risk Factors: -Group verbal and written instruction about important numbers in your health.  Discussion of what are risk factors and how they play a role in the disease process.  Review of Cholesterol, Blood Pressure, Diabetes, and BMI and the role they play in your overall health.   Sleep Hygiene: -Provides group verbal and written instruction about how sleep can affect your health.  Define sleep hygiene, discuss sleep cycles and impact of sleep habits. Review good sleep hygiene tips.    Other: -Provides group and verbal instruction on various topics (see comments)   Knowledge Questionnaire Score: Knowledge Questionnaire Score - 05/20/19 1546      Knowledge Questionnaire Score   Pre Score  22/26       Core Components/Risk Factors/Patient Goals at Admission: Personal Goals and Risk Factors at Admission - 05/20/19 1547      Core Components/Risk Factors/Patient Goals on Admission    Weight Management  Yes;Weight Loss;Obesity    Intervention  Weight Management: Develop a combined nutrition and exercise program designed to reach desired caloric intake, while maintaining appropriate intake of nutrient and fiber, sodium and fats, and appropriate energy expenditure required for the weight goal.;Weight Management: Provide education and appropriate resources to help participant work on and attain dietary goals.;Weight Management/Obesity: Establish  reasonable short term and long term weight goals.;Obesity: Provide education and appropriate resources to help participant work on and attain dietary goals.    Admit Weight  206 lb (93.4 kg)    Goal Weight: Short Term  196 lb (88.9 kg)    Goal Weight: Long Term  186 lb (84.4 kg)    Expected Outcomes  Short Term: Continue to assess and modify interventions until short term weight is achieved;Long Term: Adherence to nutrition and physical activity/exercise program aimed toward attainment of established weight goal;Weight Loss: Understanding of general recommendations for a balanced deficit meal plan, which promotes 1-2 lb weight loss per week and includes a negative energy balance of [phone] kcal/d;Understanding recommendations for meals to include 15-35% energy as protein, 25-35% energy from fat, 35-60% energy from carbohydrates, less than 241m of dietary cholesterol, 20-35 gm of total fiber daily;Understanding of distribution of calorie intake throughout the day with the consumption of 4-5 meals/snacks       Core Components/Risk Factors/Patient Goals Review:    Core Components/Risk Factors/Patient Goals at Discharge (Final Review):    ITP Comments: ITP Comments    Row Name 05/19/19 0936           ITP Comments  Completed virtual orientation today.  EP evaluation is scheduled for Thursday 3/4 at 2pm.  [facility]  Pt had oriented on 02/26/19 but never came in for his walk test for various reasons.  Now he is coming in for Cardiac Rehab.          Comments: initial ITP

## 2019-05-20 NOTE — Patient Instructions (Signed)
Patient Instructions  Patient Details  Name: Lucas Caldwell MRN: [id number] Date of Birth: [birth year] Referring Provider:  Jolaine Artist, MD  Below are your personal goals for exercise, nutrition, and risk factors. Our goal is to help you stay on track towards obtaining and maintaining these goals. We will be discussing your progress on these goals with you throughout the program.  Initial Exercise Prescription: Initial Exercise Prescription - 05/20/19 1500      Date of Initial Exercise RX and Referring Provider   Date  05/20/19    Referring Provider  Bensimhon      Treadmill   MPH  1    Grade  0    Minutes  15      REL-XR   Level  1    Speed  50    Minutes  15    METs  1      Prescription Details   Frequency (times per week)  3    Duration  Progress to 30 minutes of continuous aerobic without signs/symptoms of physical distress      Intensity   THRR 40-80% of Max Heartrate  111-128    Ratings of Perceived Exertion  11-13    Perceived Dyspnea  0-4      Resistance Training   Training Prescription  Yes    Weight  3 lb    Reps  10-15       Exercise Goals: Frequency: Be able to perform aerobic exercise two to three times per week in program working toward 2-5 days per week of home exercise.  Intensity: Work with a perceived exertion of 11 (fairly light) - 15 (hard) while following your exercise prescription.  We will make changes to your prescription with you as you progress through the program.   Duration: Be able to do 30 to 45 minutes of continuous aerobic exercise in addition to a 5 minute warm-up and a 5 minute cool-down routine.   Nutrition Goals: Your personal nutrition goals will be established when you do your nutrition analysis with the dietician.  The following are general nutrition guidelines to follow: Cholesterol < 200mg /day Sodium < 1500mg /day Fiber: Men over 50 yrs - 30 grams per day  Personal Goals: Personal Goals and Risk Factors at  Admission - 05/20/19 1547      Core Components/Risk Factors/Patient Goals on Admission    Weight Management  Yes;Weight Loss;Obesity    Intervention  Weight Management: Develop a combined nutrition and exercise program designed to reach desired caloric intake, while maintaining appropriate intake of nutrient and fiber, sodium and fats, and appropriate energy expenditure required for the weight goal.;Weight Management: Provide education and appropriate resources to help participant work on and attain dietary goals.;Weight Management/Obesity: Establish reasonable short term and long term weight goals.;Obesity: Provide education and appropriate resources to help participant work on and attain dietary goals.    Admit Weight  206 lb (93.4 kg)    Goal Weight: Short Term  196 lb (88.9 kg)    Goal Weight: Long Term  186 lb (84.4 kg)    Expected Outcomes  Short Term: Continue to assess and modify interventions until short term weight is achieved;Long Term: Adherence to nutrition and physical activity/exercise program aimed toward attainment of established weight goal;Weight Loss: Understanding of general recommendations for a balanced deficit meal plan, which promotes 1-2 lb weight loss per week and includes a negative energy balance of [phone] kcal/d;Understanding recommendations for meals to include 15-35% energy as protein, 25-35% energy  from fat, 35-60% energy from carbohydrates, less than 200mg  of dietary cholesterol, 20-35 gm of total fiber daily;Understanding of distribution of calorie intake throughout the day with the consumption of 4-5 meals/snacks       Tobacco Use Initial Evaluation: Social History   Tobacco Use  Smoking Status Former Smoker  . Packs/day: 2.00  . Years: 15.00  . Pack years: 30.00  . Types: Cigarettes  . Quit date: 21  . Years since quitting: 42.2  Smokeless Tobacco Former Systems developer  Tobacco Comment   quit 1979    Exercise Goals and Review: Exercise Goals    Row Name  05/20/19 1544             Exercise Goals   Increase Physical Activity  Yes       Intervention  Provide advice, education, support and counseling about physical activity/exercise needs.;Develop an individualized exercise prescription for aerobic and resistive training based on initial evaluation findings, risk stratification, comorbidities and participant's personal goals.       Expected Outcomes  Short Term: Attend rehab on a regular basis to increase amount of physical activity.;Long Term: Add in home exercise to make exercise part of routine and to increase amount of physical activity.;Long Term: Exercising regularly at least 3-5 days a week.       Increase Strength and Stamina  Yes       Intervention  Provide advice, education, support and counseling about physical activity/exercise needs.;Develop an individualized exercise prescription for aerobic and resistive training based on initial evaluation findings, risk stratification, comorbidities and participant's personal goals.       Expected Outcomes  Short Term: Increase workloads from initial exercise prescription for resistance, speed, and METs.;Short Term: Perform resistance training exercises routinely during rehab and add in resistance training at home;Long Term: Improve cardiorespiratory fitness, muscular endurance and strength as measured by increased METs and functional capacity (6MWT)       Able to understand and use rate of perceived exertion (RPE) scale  Yes       Intervention  Provide education and explanation on how to use RPE scale       Expected Outcomes  Short Term: Able to use RPE daily in rehab to express subjective intensity level;Long Term:  Able to use RPE to guide intensity level when exercising independently       Able to understand and use Dyspnea scale  Yes       Intervention  Provide education and explanation on how to use Dyspnea scale       Expected Outcomes  Short Term: Able to use Dyspnea scale daily in rehab to  express subjective sense of shortness of breath during exertion;Long Term: Able to use Dyspnea scale to guide intensity level when exercising independently       Knowledge and understanding of Target Heart Rate Range (THRR)  Yes       Intervention  Provide education and explanation of THRR including how the numbers were predicted and where they are located for reference       Expected Outcomes  Short Term: Able to state/look up THRR;Short Term: Able to use daily as guideline for intensity in rehab;Long Term: Able to use THRR to govern intensity when exercising independently       Able to check pulse independently  Yes       Intervention  Provide education and demonstration on how to check pulse in carotid and radial arteries.;Review the importance of being able to check your own  pulse for safety during independent exercise       Expected Outcomes  Short Term: Able to explain why pulse checking is important during independent exercise;Long Term: Able to check pulse independently and accurately       Understanding of Exercise Prescription  Yes       Intervention  Provide education, explanation, and written materials on patient's individual exercise prescription       Expected Outcomes  Short Term: Able to explain program exercise prescription;Long Term: Able to explain home exercise prescription to exercise independently          Copy of goals given to participant.Discharge Patient Instructions  Patient Details  Name: Lucas Caldwell MRN: [id number] Date of Birth: Jan 03, 1934 Referring Provider:  Jolaine Artist, MD   Number of Visits: Reason for Discharge:  Early Exit:  error  Smoking History:  Social History   Tobacco Use  Smoking Status Former Smoker  . Packs/day: 2.00  . Years: 15.00  . Pack years: 30.00  . Types: Cigarettes  . Quit date: 34  . Years since quitting: 42.2  Smokeless Tobacco Former User  Tobacco Comment   quit 1979    Diagnosis:  Chronic combined  systolic and diastolic congestive heart failure (HCC)  Initial Exercise Prescription: Initial Exercise Prescription - 05/20/19 1500      Date of Initial Exercise RX and Referring Provider   Date  05/20/19    Referring Provider  Bensimhon      Treadmill   MPH  1    Grade  0    Minutes  15      REL-XR   Level  1    Speed  50    Minutes  15    METs  1      Prescription Details   Frequency (times per week)  3    Duration  Progress to 30 minutes of continuous aerobic without signs/symptoms of physical distress      Intensity   THRR 40-80% of Max Heartrate  111-128    Ratings of Perceived Exertion  11-13    Perceived Dyspnea  0-4      Resistance Training   Training Prescription  Yes    Weight  3 lb    Reps  10-15       Discharge Exercise Prescription (Final Exercise Prescription Changes): Exercise Prescription Changes - 05/20/19 1500      Response to Exercise   Blood Pressure (Admit)  114/70    Blood Pressure (Exercise)  154/68    Blood Pressure (Exit)  124/68    Heart Rate (Admit)  95 bpm    Heart Rate (Exercise)  107 bpm    Heart Rate (Exit)  90 bpm    Oxygen Saturation (Admit)  98 %    Oxygen Saturation (Exercise)  92 %    Oxygen Saturation (Exit)  96 %    Rating of Perceived Exertion (Exercise)  15    Perceived Dyspnea (Exercise)  2    Symptoms  legs "going to sleep"    Comments  L shoulder old injury       Functional Capacity: 6 Minute Walk    Row Name 05/20/19 1539         6 Minute Walk   Distance  715 feet     Walk Time  5.35 minutes     # of Rest Breaks  1     MPH  1.54     METS  1.14  RPE  15     Perceived Dyspnea   2     VO2 Peak  4     Symptoms  Yes (comment)     Comments  legs "going to sleep" from knees down     Resting HR  95 bpm     Resting BP  114/70     Resting Oxygen Saturation   98 %     Exercise Oxygen Saturation  during 6 min walk  92 %     Max Ex. HR  107 bpm     Max Ex. BP  154/68     2 Minute Post BP  124/68         Quality of Life: Quality of Life - 05/20/19 1545      Quality of Life   Select  Quality of Life      Quality of Life Scores   Health/Function Pre  20.14 %    Socioeconomic Pre  30 %    Psych/Spiritual Pre  30 %    Family Pre  30 %    GLOBAL Pre  25.94 %       Personal Goals: Goals established at orientation with interventions provided to work toward goal. Personal Goals and Risk Factors at Admission - 05/20/19 1547      Core Components/Risk Factors/Patient Goals on Admission    Weight Management  Yes;Weight Loss;Obesity    Intervention  Weight Management: Develop a combined nutrition and exercise program designed to reach desired caloric intake, while maintaining appropriate intake of nutrient and fiber, sodium and fats, and appropriate energy expenditure required for the weight goal.;Weight Management: Provide education and appropriate resources to help participant work on and attain dietary goals.;Weight Management/Obesity: Establish reasonable short term and long term weight goals.;Obesity: Provide education and appropriate resources to help participant work on and attain dietary goals.    Admit Weight  206 lb (93.4 kg)    Goal Weight: Short Term  196 lb (88.9 kg)    Goal Weight: Long Term  186 lb (84.4 kg)    Expected Outcomes  Short Term: Continue to assess and modify interventions until short term weight is achieved;Long Term: Adherence to nutrition and physical activity/exercise program aimed toward attainment of established weight goal;Weight Loss: Understanding of general recommendations for a balanced deficit meal plan, which promotes 1-2 lb weight loss per week and includes a negative energy balance of [phone] kcal/d;Understanding recommendations for meals to include 15-35% energy as protein, 25-35% energy from fat, 35-60% energy from carbohydrates, less than 200mg  of dietary cholesterol, 20-35 gm of total fiber daily;Understanding of distribution of calorie intake  throughout the day with the consumption of 4-5 meals/snacks        Personal Goals Discharge:   Exercise Goals and Review: Exercise Goals    Row Name 05/20/19 1544             Exercise Goals   Increase Physical Activity  Yes       Intervention  Provide advice, education, support and counseling about physical activity/exercise needs.;Develop an individualized exercise prescription for aerobic and resistive training based on initial evaluation findings, risk stratification, comorbidities and participant's personal goals.       Expected Outcomes  Short Term: Attend rehab on a regular basis to increase amount of physical activity.;Long Term: Add in home exercise to make exercise part of routine and to increase amount of physical activity.;Long Term: Exercising regularly at least 3-5 days a week.  Increase Strength and Stamina  Yes       Intervention  Provide advice, education, support and counseling about physical activity/exercise needs.;Develop an individualized exercise prescription for aerobic and resistive training based on initial evaluation findings, risk stratification, comorbidities and participant's personal goals.       Expected Outcomes  Short Term: Increase workloads from initial exercise prescription for resistance, speed, and METs.;Short Term: Perform resistance training exercises routinely during rehab and add in resistance training at home;Long Term: Improve cardiorespiratory fitness, muscular endurance and strength as measured by increased METs and functional capacity (6MWT)       Able to understand and use rate of perceived exertion (RPE) scale  Yes       Intervention  Provide education and explanation on how to use RPE scale       Expected Outcomes  Short Term: Able to use RPE daily in rehab to express subjective intensity level;Long Term:  Able to use RPE to guide intensity level when exercising independently       Able to understand and use Dyspnea scale  Yes        Intervention  Provide education and explanation on how to use Dyspnea scale       Expected Outcomes  Short Term: Able to use Dyspnea scale daily in rehab to express subjective sense of shortness of breath during exertion;Long Term: Able to use Dyspnea scale to guide intensity level when exercising independently       Knowledge and understanding of Target Heart Rate Range (THRR)  Yes       Intervention  Provide education and explanation of THRR including how the numbers were predicted and where they are located for reference       Expected Outcomes  Short Term: Able to state/look up THRR;Short Term: Able to use daily as guideline for intensity in rehab;Long Term: Able to use THRR to govern intensity when exercising independently       Able to check pulse independently  Yes       Intervention  Provide education and demonstration on how to check pulse in carotid and radial arteries.;Review the importance of being able to check your own pulse for safety during independent exercise       Expected Outcomes  Short Term: Able to explain why pulse checking is important during independent exercise;Long Term: Able to check pulse independently and accurately       Understanding of Exercise Prescription  Yes       Intervention  Provide education, explanation, and written materials on patient's individual exercise prescription       Expected Outcomes  Short Term: Able to explain program exercise prescription;Long Term: Able to explain home exercise prescription to exercise independently          Exercise Goals Re-Evaluation:   Nutrition & Weight - Outcomes: Pre Biometrics - 05/20/19 1545      Pre Biometrics   Height  5\' 7"  (1.702 m)    Weight  206 lb (93.4 kg)    BMI (Calculated)  32.26        Nutrition:   Nutrition Discharge:   Education Questionnaire Score: Knowledge Questionnaire Score - 05/20/19 1546      Knowledge Questionnaire Score   Pre Score  22/26       Goals reviewed with  patient; copy given to patient. Patient Instructions  Patient Details  Name: Lucas Caldwell MRN: [id number] Date of Birth: 1934/01/24 Referring Provider:  Jolaine Artist, MD  Below  are your personal goals for exercise, nutrition, and risk factors. Our goal is to help you stay on track towards obtaining and maintaining these goals. We will be discussing your progress on these goals with you throughout the program.  Initial Exercise Prescription: Initial Exercise Prescription - 05/20/19 1500      Date of Initial Exercise RX and Referring Provider   Date  05/20/19    Referring Provider  Bensimhon      Treadmill   MPH  1    Grade  0    Minutes  15      REL-XR   Level  1    Speed  50    Minutes  15    METs  1      Prescription Details   Frequency (times per week)  3    Duration  Progress to 30 minutes of continuous aerobic without signs/symptoms of physical distress      Intensity   THRR 40-80% of Max Heartrate  111-128    Ratings of Perceived Exertion  11-13    Perceived Dyspnea  0-4      Resistance Training   Training Prescription  Yes    Weight  3 lb    Reps  10-15       Exercise Goals: Frequency: Be able to perform aerobic exercise two to three times per week in program working toward 2-5 days per week of home exercise.  Intensity: Work with a perceived exertion of 11 (fairly light) - 15 (hard) while following your exercise prescription.  We will make changes to your prescription with you as you progress through the program.   Duration: Be able to do 30 to 45 minutes of continuous aerobic exercise in addition to a 5 minute warm-up and a 5 minute cool-down routine.   Nutrition Goals: Your personal nutrition goals will be established when you do your nutrition analysis with the dietician.  The following are general nutrition guidelines to follow: Cholesterol < 200mg /day Sodium < 1500mg /day Fiber: Men over 50 yrs - 30 grams per day  Personal  Goals: Personal Goals and Risk Factors at Admission - 05/20/19 1547      Core Components/Risk Factors/Patient Goals on Admission    Weight Management  Yes;Weight Loss;Obesity    Intervention  Weight Management: Develop a combined nutrition and exercise program designed to reach desired caloric intake, while maintaining appropriate intake of nutrient and fiber, sodium and fats, and appropriate energy expenditure required for the weight goal.;Weight Management: Provide education and appropriate resources to help participant work on and attain dietary goals.;Weight Management/Obesity: Establish reasonable short term and long term weight goals.;Obesity: Provide education and appropriate resources to help participant work on and attain dietary goals.    Admit Weight  206 lb (93.4 kg)    Goal Weight: Short Term  196 lb (88.9 kg)    Goal Weight: Long Term  186 lb (84.4 kg)    Expected Outcomes  Short Term: Continue to assess and modify interventions until short term weight is achieved;Long Term: Adherence to nutrition and physical activity/exercise program aimed toward attainment of established weight goal;Weight Loss: Understanding of general recommendations for a balanced deficit meal plan, which promotes 1-2 lb weight loss per week and includes a negative energy balance of [phone] kcal/d;Understanding recommendations for meals to include 15-35% energy as protein, 25-35% energy from fat, 35-60% energy from carbohydrates, less than 200mg  of dietary cholesterol, 20-35 gm of total fiber daily;Understanding of distribution of calorie intake throughout the  day with the consumption of 4-5 meals/snacks       Tobacco Use Initial Evaluation: Social History   Tobacco Use  Smoking Status Former Smoker  . Packs/day: 2.00  . Years: 15.00  . Pack years: 30.00  . Types: Cigarettes  . Quit date: 65  . Years since quitting: 42.2  Smokeless Tobacco Former Systems developer  Tobacco Comment   quit 1979    Exercise Goals  and Review: Exercise Goals    Row Name 05/20/19 1544             Exercise Goals   Increase Physical Activity  Yes       Intervention  Provide advice, education, support and counseling about physical activity/exercise needs.;Develop an individualized exercise prescription for aerobic and resistive training based on initial evaluation findings, risk stratification, comorbidities and participant's personal goals.       Expected Outcomes  Short Term: Attend rehab on a regular basis to increase amount of physical activity.;Long Term: Add in home exercise to make exercise part of routine and to increase amount of physical activity.;Long Term: Exercising regularly at least 3-5 days a week.       Increase Strength and Stamina  Yes       Intervention  Provide advice, education, support and counseling about physical activity/exercise needs.;Develop an individualized exercise prescription for aerobic and resistive training based on initial evaluation findings, risk stratification, comorbidities and participant's personal goals.       Expected Outcomes  Short Term: Increase workloads from initial exercise prescription for resistance, speed, and METs.;Short Term: Perform resistance training exercises routinely during rehab and add in resistance training at home;Long Term: Improve cardiorespiratory fitness, muscular endurance and strength as measured by increased METs and functional capacity (6MWT)       Able to understand and use rate of perceived exertion (RPE) scale  Yes       Intervention  Provide education and explanation on how to use RPE scale       Expected Outcomes  Short Term: Able to use RPE daily in rehab to express subjective intensity level;Long Term:  Able to use RPE to guide intensity level when exercising independently       Able to understand and use Dyspnea scale  Yes       Intervention  Provide education and explanation on how to use Dyspnea scale       Expected Outcomes  Short Term: Able  to use Dyspnea scale daily in rehab to express subjective sense of shortness of breath during exertion;Long Term: Able to use Dyspnea scale to guide intensity level when exercising independently       Knowledge and understanding of Target Heart Rate Range (THRR)  Yes       Intervention  Provide education and explanation of THRR including how the numbers were predicted and where they are located for reference       Expected Outcomes  Short Term: Able to state/look up THRR;Short Term: Able to use daily as guideline for intensity in rehab;Long Term: Able to use THRR to govern intensity when exercising independently       Able to check pulse independently  Yes       Intervention  Provide education and demonstration on how to check pulse in carotid and radial arteries.;Review the importance of being able to check your own pulse for safety during independent exercise       Expected Outcomes  Short Term: Able to explain why pulse checking is important  during independent exercise;Long Term: Able to check pulse independently and accurately       Understanding of Exercise Prescription  Yes       Intervention  Provide education, explanation, and written materials on patient's individual exercise prescription       Expected Outcomes  Short Term: Able to explain program exercise prescription;Long Term: Able to explain home exercise prescription to exercise independently          Copy of goals given to participant.

## 2019-05-21 DIAGNOSIS — I255 Ischemic cardiomyopathy: Secondary | ICD-10-CM | POA: Diagnosis not present

## 2019-05-21 DIAGNOSIS — I5042 Chronic combined systolic (congestive) and diastolic (congestive) heart failure: Secondary | ICD-10-CM | POA: Diagnosis not present

## 2019-05-21 DIAGNOSIS — I480 Paroxysmal atrial fibrillation: Secondary | ICD-10-CM | POA: Diagnosis not present

## 2019-05-21 DIAGNOSIS — I130 Hypertensive heart and chronic kidney disease with heart failure and stage 1 through stage 4 chronic kidney disease, or unspecified chronic kidney disease: Secondary | ICD-10-CM | POA: Diagnosis not present

## 2019-05-21 DIAGNOSIS — I2510 Atherosclerotic heart disease of native coronary artery without angina pectoris: Secondary | ICD-10-CM | POA: Diagnosis not present

## 2019-05-21 DIAGNOSIS — N184 Chronic kidney disease, stage 4 (severe): Secondary | ICD-10-CM | POA: Diagnosis not present

## 2019-05-21 DIAGNOSIS — E1140 Type 2 diabetes mellitus with diabetic neuropathy, unspecified: Secondary | ICD-10-CM | POA: Diagnosis not present

## 2019-05-21 DIAGNOSIS — J432 Centrilobular emphysema: Secondary | ICD-10-CM | POA: Diagnosis not present

## 2019-05-21 DIAGNOSIS — E1122 Type 2 diabetes mellitus with diabetic chronic kidney disease: Secondary | ICD-10-CM | POA: Diagnosis not present

## 2019-05-24 ENCOUNTER — Other Ambulatory Visit: Payer: Self-pay

## 2019-05-24 DIAGNOSIS — I5042 Chronic combined systolic (congestive) and diastolic (congestive) heart failure: Secondary | ICD-10-CM

## 2019-05-24 NOTE — Progress Notes (Signed)
Completed Initial RD Eval 

## 2019-05-25 ENCOUNTER — Encounter: Payer: Self-pay | Admitting: Family Medicine

## 2019-05-25 NOTE — Progress Notes (Signed)
Follow-up Outpatient Visit Date: 05/26/2019  Primary Care Provider: [name], DO [address] [id number]  Chief Complaint: Follow-up heart failure and coronary artery disease  HPI:  [name] is a 86 y.o. male with history of coronary artery disease,chronic systolic heart failure due to likelymixed ischemic and nonischemic cardiomyopathy, hypertension, hyperlipidemia, type 2 diabetes mellitus, paroxysmal atrial fibrillation, chronic kidney disease stage III, CML, iron deficiency anemia,GI bleed due to duodenal ulcers,BPH, and hypothyroidism, who presents for follow-up of coronary artery disease and chronic heart failure.  I last saw him 2 weeks ago, at which time he continued to report significant shortness of breath with minimal activity, present ever since LHC/PCI in late January.  He had put on 17 pounds in 1 week and also exhibited a 6% increase in his ReDS vest reading.  We agreed to increase torsemide to 60 mg BID and restart warfarin with the plan to discontinue ticagrelor once his INR was therapeutic in case dyspnea was related to ticagrelor.  He was subsequently seen by [name], who felt that dyspnea was multifactorial (obesity, probable OSA, and deconditioning) superimposed on edema.  Wilder Glade was added to his regimen for diabetes and heart failure treatment.  No other medication changes were made.  Today, Mr. Bruntz reports that he might feel slightly better with decreased shortness of breath.  [name] certainly no worse than at our last visit.  His leg edema has improved.  He was started on Farxiga by [name], which he is tolerating well.  [name] concerned about urinary frequency and some accidents that forced him to use depends.  He denies chest pain, palpitations, and lightheadedness.  He has not had any significant bleeding, remaining on warfarin, ticagrelor, and aspirin.  His INR was slightly subtherapeutic today at  1.9.  --------------------------------------------------------------------------------------------------  Past Medical History:  Diagnosis Date  . Arthritis    "probably in his legs" (08/15/2016)  . Blind right eye   . BPH (benign prostatic hyperplasia)   . Breast asymmetry    Left breast is larger, present for several years.  [name] CAD (coronary artery disease)    a. Cath in the late 90's - reportedly ok;  b. 2014 s/p stenting x 2 @ [facility]; c 08/19/16 Cath/PCI with DES -> RCA, plan to treat LM 70% medically. Seen by surgery and felt to be too high risk for CABG; d. 08/2018 Cath: LM 70 (iFR 0.86), LAD 20ost, 40p, 50/10m D1 70, LCX patent stent, OM1 30, RCA patent stent, 419mSR, 40d, RPDA 30. PCWP 8. CO/CI 4.0/2.1.  . Marland Kitchenhronic combined systolic (congestive) and diastolic (congestive) heart failure (HCM[place]   a. Previously reduced EF-->50% by echo in 2012;  b. 06/2015 Echo: EF 50-55%  c. 07/2016 Echo: EF 45-50%; d. 12/2017 Echo: EF 55%, Gr1 DD, mild MR; e. 08/2018 Echo: EF 35-40%, mildly dil PA.  . Marland Kitchenhronic kidney disease (CKD), stage IV (severe) (HCPort [place]  . CML (chronic myelocytic leukemia) (HCNantuck[place]  . GERD (gastroesophageal reflux disease)   . GIB (gastrointestinal bleeding)    a. 12/2017 3 unit PRBC GIB in setting of coumadin-->Endo/colon multiple duodenal ulcers and a single bleeding ulcer in the proximal ascending colon status post hemostatic clipping x2.  . Gout   . Hyperlipemia   . Hypertension   . Hypothyroidism   . Iron deficiency anemia   . Ischemic cardiomyopathy    a. Previously reduced EF-->50% by echo in 2012;  b. 06/2015 Echo: EF 50-55%, Gr2  DD, mild MR, mildly dil LA, Ao sclerosis, mild TR;  c. 07/2016 Echo: EF 45-50%; d. 12/2017 Echo: EF 55%, Gr1 DD, mild MR; e. 08/2018 Echo: EF 35-40%.  . Migraine    "in the 1960s" (08/15/2016)  . PAF (paroxysmal atrial fibrillation) (HCC)    a. ? Dx 2014-->s/p DCCV;  b. CHA2DS2VASc = 6--> Coumadin.  . Prostate cancer ([place])   . RBBB   . Type II diabetes  mellitus ([place])    Past Surgical History:  Procedure Laterality Date  . CATARACT EXTRACTION W/ INTRAOCULAR LENS  IMPLANT, BILATERAL Bilateral   . COLONOSCOPY N/A 01/13/2018   Procedure: COLONOSCOPY;  Surgeon: Lin Landsman, MD;  Location: [facility] ENDOSCOPY;  Service: Gastroenterology;  Laterality: N/A;  . COLONOSCOPY WITH PROPOFOL N/A 01/01/2018   Procedure: COLONOSCOPY WITH PROPOFOL;  Surgeon: Lin Landsman, MD;  Location: [facility] ENDOSCOPY;  Service: Gastroenterology;  Laterality: N/A;  . CORONARY ANGIOPLASTY WITH STENT PLACEMENT  09/2012   2 stents  . CORONARY STENT INTERVENTION N/A 08/19/2016   Procedure: Coronary Stent Intervention;  Surgeon: [name], MD;  Location: [place] CV LAB;  Service: Cardiovascular;  Laterality: N/A;  . CORONARY STENT INTERVENTION N/A 04/13/2019   Procedure: CORONARY STENT INTERVENTION;  Surgeon: [name], MD;  Location: [place] CV LAB;  Service: Cardiovascular;  Laterality: N/A;  . ESOPHAGEAL MANOMETRY N/A 12/16/2017   Procedure: ESOPHAGEAL MANOMETRY (EM);  Surgeon: Lin Landsman, MD;  Location: [facility] ENDOSCOPY;  Service: Gastroenterology;  Laterality: N/A;  . ESOPHAGOGASTRODUODENOSCOPY N/A 12/20/2016   Procedure: ESOPHAGOGASTRODUODENOSCOPY (EGD);  Surgeon: Lin Landsman, MD;  [facility] END[facility]COPY;  Service: Gastroenterology;  Laterality: N/A;  . ESOPHAGOGASTRODUODENOSCOPY N/A 01/13/2018   Procedure: ESOPHAGOGASTRODUODENOSCOPY (EGD);  Surgeon: Lin Landsman, MD;  Location: [facility] ENDOSCOPY;  Service: Gastroenterology;  Laterality: N/A;  . ESOPHAGOGASTRODUODENOSCOPY (EGD) WITH PROPOFOL N/A 11/03/2017   Procedure: ESOPHAGOGASTRODUODENOSCOPY (EGD) WITH PROPOFOL;  Surgeon: Lin Landsman, MD;  Location: [facility] ENDOSCOPY;  Service: Gastroenterology;  Laterality: N/A;  . EYE SURGERY    . HERNIA REPAIR     "navel"  . LEFT HEART CATH AND CORONARY ANGIOGRAPHY N/A 08/14/2016   Procedure: Left Heart Cath and Coronary  Angiography;  Surgeon: [name], MD;  Location: [place] CV LAB;  Service: Cardiovascular;  Laterality: N/A;  . RIGHT/LEFT HEART CATH AND CORONARY ANGIOGRAPHY N/A 09/01/2018   Procedure: RIGHT/LEFT HEART CATH AND CORONARY ANGIOGRAPHY;  Surgeon: [name], MD;  Location: [place] CV LAB;  Service: Cardiovascular;  Laterality: N/A;  . RIGHT/LEFT HEART CATH AND CORONARY ANGIOGRAPHY N/A 04/13/2019   Procedure: RIGHT/LEFT HEART CATH AND CORONARY ANGIOGRAPHY;  Surgeon: [name], MD;  Location: [place] CV LAB;  Service: Cardiovascular;  Laterality: N/A;  . TOE AMPUTATION Right    "big toe"    Current Meds  Medication Sig  . ACCU-CHEK AVIVA PLUS test strip Use to check blood sugar up to 2 times daily  . Accu-Chek Softclix Lancets lancets Use to check blood sugar up to 2 times daily.  [name] acetaminophen (TYLENOL) 500 MG tablet Take 1,000 mg by mouth every 6 (six) hours as needed for moderate pain or headache.   . Alcohol Swabs 70 % PADS Use when checking blood sugar up to 3x daily  . allopurinol (ZYLOPRIM) 100 MG tablet TAKE ONE TABLET BY MOUTH ONCE DAILY  . aspirin 81 MG chewable tablet Chew 1 tablet (81 mg total) by mouth daily.  . Blood Glucose Monitoring Suppl (ACCU-CHEK AVIVA PLUS) w/Device KIT Use to check blood sugar  up to 2 times daily  . bosutinib (BOSULIF) 100 MG tablet Take 100 mg by mouth at bedtime as needed (tired/not feeling well). Take with food.   . dapagliflozin propanediol (FARXIGA) 10 MG TABS tablet Take 10 mg by mouth daily before breakfast.  . Dulaglutide (TRULICITY) 5.32 YE/3.3ID SOPN Inject 0.75 mg into the skin once a week. (Patient taking differently: Inject 0.75 mg into the skin every Saturday. )  . gabapentin (NEURONTIN) 300 MG capsule Take 2 capsules (600 mg total) by mouth 2 (two) times daily. Max dose = 1255m daily for kidney function  . hydrALAZINE (APRESOLINE) 50 MG tablet Take 1 tablet (50 mg total) by mouth 3 (three) times daily.  .  Insulin Glargine ([name] KWIKPEN) 100 UNIT/ML SOPN Inject 38 Units into the skin daily.   . Insulin Pen Needle 31G X 5 MM MISC 31 g by Does not apply route as directed.  . isosorbide mononitrate (IMDUR) 60 MG 24 hr tablet TAKE 1 AND 1/2 TABLETS BY MOUTH ONCE DAILY  . levothyroxine (SYNTHROID) 25 MCG tablet TAKE 1 TABLET (25 MCG TOTAL) BY MOUTH DAILY BEFORE BREAKFAST.  .Marland Kitchenloperamide (IMODIUM A-D) 2 MG tablet Take 2 mg by mouth 3 (three) times daily as needed for diarrhea or loose stools.   . Menthol-Zinc Oxide (GOLD BOND EX) Apply 1 application topically daily as needed (muscle pain).  . metoprolol succinate (TOPROL-XL) 25 MG 24 hr tablet Take 1 tablet (25 mg total) by mouth daily.  . Multiple Vitamins-Minerals (MULTIVITAMIN ADULTS 50+ PO) Take 1 tablet by mouth every morning.  . nitroGLYCERIN (NITROSTAT) 0.4 MG SL tablet Place 1 tablet (0.4 mg total) under the tongue every 5 (five) minutes x 3 doses as needed for chest pain.  .Marland Kitchenomeprazole (PRILOSEC) 20 MG capsule Take 20 mg by mouth 2 (two) times daily.  .[name] (SYSTANE OP) Place 1 drop into both eyes daily as needed (dry eyes).  . polyethylene glycol (MIRALAX / GLYCOLAX) 17 g packet Take 17 g by mouth daily as needed for mild constipation.   . potassium chloride SA (KLOR-CON) 20 MEQ tablet Take 1 tablet (20 mEq total) by mouth daily. Take 3 tablets the first day only.  .Marland KitchenPROVENTIL HFA 108 (90 Base) MCG/ACT inhaler Inhale 2 puffs into the lungs every 6 (six) hours as needed for wheezing or shortness of breath.  . ranolazine (RANEXA) 500 MG 12 hr tablet TAKE ONE TABLET BY MOUTH TWICE DAILY  . rosuvastatin (CRESTOR) 5 MG tablet Take 1 tablet (5 mg total) by mouth daily.  .Marland Kitchensenna (SENOKOT) 8.6 MG tablet Take 1 tablet by mouth daily as needed for constipation.  . ticagrelor (BRILINTA) 90 MG TABS tablet Take 1 tablet (90 mg total) by mouth 2 (two) times daily.  .Marland Kitchentorsemide (DEMADEX) 20 MG tablet Take 3 tablets (60 mg total) by  mouth 2 (two) times daily.  . traMADol (ULTRAM) 50 MG tablet Take 1 tablet (50 mg total) by mouth every 8 (eight) hours as needed for moderate pain.  .Marland Kitchenwarfarin (COUMADIN) 3 MG tablet Take 1 tablet (3 mg total) by mouth as directed.    Allergies: Clopidogrel, Ciprofloxacin, Mirtazapine, and Spironolactone  Social History   Tobacco Use  . Smoking status: Former Smoker    Packs/day: 2.00    Years: 15.00    Pack years: 30.00    Types: Cigarettes    Quit date: 1979    Years since quitting: 42.2  . Smokeless tobacco: Former USystems developer . Tobacco  comment: quit 1979  Substance Use Topics  . Alcohol use: No    Comment: previously drank heavily - quit 1979.  . Drug use: No    Family History  Problem Relation Age of Onset  . Brain cancer Father   . Diabetes Sister   . Heart attack Sister     Review of Systems: A 12-system review of systems was performed and was negative except as noted in the HPI.  --------------------------------------------------------------------------------------------------  Physical Exam: BP 118/72   Pulse 97   Ht '5\' 5"'  (1.651 m)   Wt 201 lb (91.2 kg)   SpO2 98%   BMI 33.45 kg/m   General: NAD.  Accompanied by his wife. HEENT: No conjunctival pallor or scleral icterus. Facemask in place. Neck: No significant JVD or HJR, though body habitus limits evaluation. Lungs: Normal work of breathing. Clear to auscultation bilaterally without wheezes or crackles. Heart: Regular rate and rhythm without murmurs, rubs, or gallops. Abd: Bowel sounds present. Soft, NT/ND. Ext: Trace pretibial edema bilaterally. Skin: Warm and dry without rash.  EKG: Normal sinus rhythm with bifascicular block (RBBB and LAFB) and LVH.  PR interval 176 ms.  No significant change.  Lab Results  Component Value Date   WBC 7.6 04/28/2019   HGB 12.7 (L) 04/28/2019   HCT 39.3 04/28/2019   MCV 86.4 04/28/2019   PLT 212 04/28/2019    Lab Results  Component Value Date   NA 137  05/14/2019   K 3.4 (L) 05/14/2019   CL 98 05/14/2019   CO2 25 05/14/2019   BUN 18 05/14/2019   CREATININE 1.57 (H) 05/14/2019   GLUCOSE 115 (H) 05/14/2019   ALT 14 08/28/2018    Lab Results  Component Value Date   CHOL 110 08/28/2018   HDL 28 (L) 08/28/2018   LDLCALC 51 08/28/2018   TRIG 155 (H) 08/28/2018   CHOLHDL 3.9 08/28/2018    --------------------------------------------------------------------------------------------------  ASSESSMENT AND PLAN: Chronic systolic and diastolic heart failure: Mr. Kovalenko is slowly improving and has lost 7 pounds since 05/12/2019.  He still has trace edema on exam today.  I recommend that we continue his current regimen including torsemide 60 mg twice daily.  We will continue current doses of hydralazine and isosorbide mononitrate.  I am reluctant to rechallenge Mr. 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  I have asked Mr. Berling to follow-up with [name] in the next 2 weeks for further assessment.  I will defer rechallenge with ACE inhibitor/ARB/ARN I to [name].  We will continue [place].  We will plan to repeat a BMP when Mr. Guin returns in 2 weeks for his INR check, given chronic kidney disease and ongoing diuresis.  Coronary artery disease: Mr. Vallee has not had any chest pain and reports stable to slightly improved dyspnea which has been an anginal equivalent for him in the past.  His INR remains slightly subtherapeutic today.  We will discontinue aspirin and continue ticagrelor and warfarin until INR is therapeutic.  At that point, we will consider discontinuation of ticagrelor and proceeding with warfarin and aspirin in case ticagrelor is contributing to Mr. Marsolek' shortness of breath.  Continue secondary prevention with rosuvastatin.  Hyperlipidemia: LDL at goal.  Continue low-dose rosuvastatin.  Hypertension: Blood pressure well controlled today.  No  medication changes at this time.  Follow-up: Return to see me in 1 month.  [name], MD 05/26/2019 2:22 PM

## 2019-05-26 ENCOUNTER — Other Ambulatory Visit: Payer: Self-pay

## 2019-05-26 ENCOUNTER — Encounter: Payer: Self-pay | Admitting: Internal Medicine

## 2019-05-26 ENCOUNTER — Ambulatory Visit (INDEPENDENT_AMBULATORY_CARE_PROVIDER_SITE_OTHER): Payer: Medicare HMO | Admitting: Internal Medicine

## 2019-05-26 ENCOUNTER — Ambulatory Visit (INDEPENDENT_AMBULATORY_CARE_PROVIDER_SITE_OTHER): Payer: Medicare HMO

## 2019-05-26 VITALS — BP 118/72 | HR 97 | Ht 65.0 in | Wt 201.0 lb

## 2019-05-26 DIAGNOSIS — I25118 Atherosclerotic heart disease of native coronary artery with other forms of angina pectoris: Secondary | ICD-10-CM | POA: Diagnosis not present

## 2019-05-26 DIAGNOSIS — Z79899 Other long term (current) drug therapy: Secondary | ICD-10-CM

## 2019-05-26 DIAGNOSIS — I10 Essential (primary) hypertension: Secondary | ICD-10-CM | POA: Diagnosis not present

## 2019-05-26 DIAGNOSIS — I5042 Chronic combined systolic (congestive) and diastolic (congestive) heart failure: Secondary | ICD-10-CM | POA: Diagnosis not present

## 2019-05-26 DIAGNOSIS — I480 Paroxysmal atrial fibrillation: Secondary | ICD-10-CM

## 2019-05-26 DIAGNOSIS — E785 Hyperlipidemia, unspecified: Secondary | ICD-10-CM

## 2019-05-26 DIAGNOSIS — Z7901 Long term (current) use of anticoagulants: Secondary | ICD-10-CM | POA: Diagnosis not present

## 2019-05-26 LAB — POCT INR: INR: 1.9 — AB (ref 2.0–3.0)

## 2019-05-26 MED ORDER — WARFARIN SODIUM 3 MG PO TABS: 3.0000 mg | ORAL_TABLET | ORAL | 1 refills | Status: DC

## 2019-05-26 MED ORDER — HYDRALAZINE HCL 50 MG PO TABS: 50.0000 mg | ORAL_TABLET | Freq: Three times a day (TID) | ORAL | 2 refills | Status: DC

## 2019-05-26 MED ORDER — ROSUVASTATIN CALCIUM 5 MG PO TABS: 5.0000 mg | ORAL_TABLET | Freq: Every day | ORAL | 3 refills | Status: DC

## 2019-05-26 NOTE — Patient Instructions (Signed)
-   take 1.5 tablets tonight, then tomorrow resume dosage warfarin of 1 TABLET EVERY DAY EXCEPT 1.5 TABLETS ON MONDAYS & FRIDAYS. - recheck INR in 2 weeks.

## 2019-05-26 NOTE — Patient Instructions (Addendum)
Medication Instructions:  Your physician has recommended you make the following change in your medication:  1- STOP Aspirin.  *If you need a refill on your cardiac medications before your next appointment, please call your pharmacy*  Lab Work: Your physician recommends that you return for lab work in: 2 weeks when you're here for INR check with [facility] in the office. BMET.  If you have labs (blood work) drawn today and your tests are completely normal, you will receive your results only by: [name] MyChart Message (if you have MyChart) OR . A paper copy in the mail If you have any lab test that is abnormal or we need to change your treatment, we will call you to review the results.  Testing/Procedures: none  Follow-Up: Your physician recommends that you schedule a follow-up appointment in: about 2 weeks with Dr Haroldine Laws. Please call them to schedule. [phone]  At [facility], you and your health needs are our priority.  As part of our continuing mission to provide you with exceptional heart care, we have created designated Provider Care Teams.  These Care Teams include your primary Cardiologist (physician) and Advanced Practice Providers (APPs -  Physician Assistants and Nurse Practitioners) who all work together to provide you with the care you need, when you need it.  We recommend signing up for the patient portal called "MyChart".  Sign up information is provided on this After Visit Summary.  MyChart is used to connect with patients for Virtual Visits (Telemedicine).  Patients are able to view lab/test results, encounter notes, upcoming appointments, etc.  Non-urgent messages can be sent to your provider as well.   To learn more about what you can do with MyChart, go to [email or web address].    Your next appointment:   1 month(s)  The format for your next appointment:   In Person  Provider:    You may see [name], MD or one of the following Advanced Practice Providers  on your designated Care Team:    [name], NP  [name], PA-C  [name], PA-C

## 2019-05-27 ENCOUNTER — Encounter: Payer: Self-pay | Admitting: Internal Medicine

## 2019-05-27 ENCOUNTER — Encounter (HOSPITAL_COMMUNITY): Payer: Medicare HMO

## 2019-05-28 DIAGNOSIS — I5042 Chronic combined systolic (congestive) and diastolic (congestive) heart failure: Secondary | ICD-10-CM | POA: Diagnosis not present

## 2019-05-28 DIAGNOSIS — N184 Chronic kidney disease, stage 4 (severe): Secondary | ICD-10-CM | POA: Diagnosis not present

## 2019-05-28 DIAGNOSIS — E1140 Type 2 diabetes mellitus with diabetic neuropathy, unspecified: Secondary | ICD-10-CM | POA: Diagnosis not present

## 2019-05-28 DIAGNOSIS — J432 Centrilobular emphysema: Secondary | ICD-10-CM | POA: Diagnosis not present

## 2019-05-28 DIAGNOSIS — I255 Ischemic cardiomyopathy: Secondary | ICD-10-CM | POA: Diagnosis not present

## 2019-05-28 DIAGNOSIS — E1122 Type 2 diabetes mellitus with diabetic chronic kidney disease: Secondary | ICD-10-CM | POA: Diagnosis not present

## 2019-05-28 DIAGNOSIS — I480 Paroxysmal atrial fibrillation: Secondary | ICD-10-CM | POA: Diagnosis not present

## 2019-05-28 DIAGNOSIS — I2510 Atherosclerotic heart disease of native coronary artery without angina pectoris: Secondary | ICD-10-CM | POA: Diagnosis not present

## 2019-05-28 DIAGNOSIS — I130 Hypertensive heart and chronic kidney disease with heart failure and stage 1 through stage 4 chronic kidney disease, or unspecified chronic kidney disease: Secondary | ICD-10-CM | POA: Diagnosis not present

## 2019-05-28 NOTE — Addendum Note (Signed)
Addended by: Raelene Bott, Brayli Klingbeil L on: 05/28/2019 04:10 PM   Modules accepted: Orders

## 2019-05-31 ENCOUNTER — Telehealth: Payer: Self-pay

## 2019-05-31 DIAGNOSIS — I5022 Chronic systolic (congestive) heart failure: Secondary | ICD-10-CM | POA: Diagnosis not present

## 2019-05-31 NOTE — Telephone Encounter (Signed)
Called pt as he missed his first day of rehab; he blew out his tire on the way over and plans to be here Wednesday 3/17.

## 2019-06-02 ENCOUNTER — Encounter: Payer: Medicare HMO | Admitting: *Deleted

## 2019-06-02 ENCOUNTER — Telehealth: Payer: Self-pay | Admitting: Family Medicine

## 2019-06-02 ENCOUNTER — Other Ambulatory Visit: Payer: Self-pay

## 2019-06-02 DIAGNOSIS — I5042 Chronic combined systolic (congestive) and diastolic (congestive) heart failure: Secondary | ICD-10-CM

## 2019-06-02 DIAGNOSIS — Z79899 Other long term (current) drug therapy: Secondary | ICD-10-CM | POA: Diagnosis not present

## 2019-06-02 DIAGNOSIS — N400 Enlarged prostate without lower urinary tract symptoms: Secondary | ICD-10-CM | POA: Diagnosis not present

## 2019-06-02 DIAGNOSIS — E119 Type 2 diabetes mellitus without complications: Secondary | ICD-10-CM | POA: Diagnosis not present

## 2019-06-02 DIAGNOSIS — Z7901 Long term (current) use of anticoagulants: Secondary | ICD-10-CM | POA: Diagnosis not present

## 2019-06-02 DIAGNOSIS — Z87891 Personal history of nicotine dependence: Secondary | ICD-10-CM | POA: Diagnosis not present

## 2019-06-02 DIAGNOSIS — Z7982 Long term (current) use of aspirin: Secondary | ICD-10-CM | POA: Diagnosis not present

## 2019-06-02 DIAGNOSIS — I110 Hypertensive heart disease with heart failure: Secondary | ICD-10-CM | POA: Diagnosis not present

## 2019-06-02 DIAGNOSIS — Z794 Long term (current) use of insulin: Secondary | ICD-10-CM | POA: Diagnosis not present

## 2019-06-02 LAB — GLUCOSE, CAPILLARY
Glucose-Capillary: 249 mg/dL — ABNORMAL HIGH (ref 70–99)
Glucose-Capillary: 173 mg/dL — ABNORMAL HIGH (ref 70–99)

## 2019-06-02 NOTE — Telephone Encounter (Signed)
   KNB 06/02/2019  Name: Lucas Caldwell   MRN: [id number]   DOB: [birth year]   AGE: 86 y.o.   GENDER: male   PCP [name], DO.   Called pt regarding Community Resource Referral pt was at hospital asked for a c/b Follow up on: 3/18   [name] . [email or web address]  [phone]

## 2019-06-02 NOTE — Progress Notes (Signed)
Daily Session Note  Patient Details  Name: Lucas Caldwell MRN: [id number] Date of Birth: Mar 04, 1934 Referring Provider:     Cardiac Rehab from 05/20/2019 in [facility]  Referring Provider  Bensimhon      Encounter Date: 06/02/2019  Check In: Session Check In - 06/02/19 1351      Check-In   Supervising physician immediately available to respond to emergencies  See telemetry face sheet for immediately available ER MD    Location  [facility]-Cardiac & Pulmonary Rehab    Staff Present  Heath Lark, RN, BSN, CCRP;Melissa Caiola RDN, LDN;Meredith Sherryll Burger, RN BSN;Joseph Hood RCP,RRT,BSRT    Virtual Visit  No    Medication changes reported      No    Fall or balance concerns reported     No    Warm-up and Cool-down  Performed on first and last piece of equipment    Resistance Training Performed  Yes    VAD Patient?  No    PAD/SET Patient?  No      Pain Assessment   Currently in Pain?  No/denies          Social History   Tobacco Use  Smoking Status Former Smoker  . Packs/day: 2.00  . Years: 15.00  . Pack years: 30.00  . Types: Cigarettes  . Quit date: 2  . Years since quitting: 42.2  Smokeless Tobacco Former Systems developer  Tobacco Comment   quit 1979    Goals Met:  Exercise tolerated well Personal goals reviewed No report of cardiac concerns or symptoms  Goals Unmet:  Not Applicable  Comments: First full day of exercise!  Patient was oriented to gym and equipment including functions, settings, policies, and procedures.  Patient's individual exercise prescription and treatment plan were reviewed.  All starting workloads were established based on the results of the 6 minute walk test done at initial orientation visit.  The plan for exercise progression was also introduced and progression will be customized based on patient's performance and goals.    Dr. Emily Filbert is Medical Director for [name].

## 2019-06-03 ENCOUNTER — Encounter: Payer: Self-pay | Admitting: Family Medicine

## 2019-06-03 ENCOUNTER — Encounter (HOSPITAL_COMMUNITY): Payer: Medicare HMO | Admitting: Internal Medicine

## 2019-06-03 ENCOUNTER — Ambulatory Visit: Payer: Medicare HMO | Admitting: Internal Medicine

## 2019-06-03 NOTE — Telephone Encounter (Signed)
   KNB 06/03/2019 1st Attempt  Name: DEVIAN BARTOLOMEI   MRN: [id number]   DOB: 1934-02-24   AGE: 86 y.o.   GENDER: male   PCP [name], DO.   Called pt and spoke with wife regarding Community Resource Referral for assistance paying for Oxygen. She was not certain whether Mr. [facility] Medicare was paying towards their bill or not. 3 way conf call with [employer] in [place] [phone] and spoke with representative Alden Benjamin. She stated that [facility] was paying 80% of the cost and the pt is responsible for remaining 20%. Their current bill is $43 from service Jan - March ($14.47 per month) she stated that there are not patient assistance programs to help towards cost since the monthly cost is so low but she did tell Ms. Flynn that she could pay a small portion when her bill comes so that it does not go into collections. Ms. Stith confirmed that she would do that when the bill arrives in 7 days.  Ms. Higginson stated that she had received assistance paying towards their electric bill from the [place] and I also reminded her that we helped with food bill and grocery gift certificate for Mr. Manthei in Nov 2019 Referral # [id number]  She stated that she was not sure whether she was eligible for Food Stamps and I told her that I would mail letter with information as well as Medicaid Spend down program should they need it in the future. Gave pt's wife my contact information and will include it on the letter mailed to her home. Closing referral pending any other needs of patient.  [place] . [email or web address]  [phone]

## 2019-06-04 ENCOUNTER — Ambulatory Visit: Payer: Medicare HMO | Admitting: Family Medicine

## 2019-06-07 ENCOUNTER — Other Ambulatory Visit (HOSPITAL_COMMUNITY): Payer: Self-pay

## 2019-06-07 ENCOUNTER — Encounter: Payer: Medicare HMO | Admitting: *Deleted

## 2019-06-07 ENCOUNTER — Ambulatory Visit (INDEPENDENT_AMBULATORY_CARE_PROVIDER_SITE_OTHER): Payer: Medicare HMO | Admitting: Pharmacist

## 2019-06-07 ENCOUNTER — Other Ambulatory Visit: Payer: Self-pay

## 2019-06-07 ENCOUNTER — Telehealth (HOSPITAL_COMMUNITY): Payer: Self-pay | Admitting: Pharmacist

## 2019-06-07 DIAGNOSIS — Z599 Problem related to housing and economic circumstances, unspecified: Secondary | ICD-10-CM

## 2019-06-07 DIAGNOSIS — Z794 Long term (current) use of insulin: Secondary | ICD-10-CM | POA: Diagnosis not present

## 2019-06-07 DIAGNOSIS — E119 Type 2 diabetes mellitus without complications: Secondary | ICD-10-CM | POA: Diagnosis not present

## 2019-06-07 DIAGNOSIS — Z7982 Long term (current) use of aspirin: Secondary | ICD-10-CM | POA: Diagnosis not present

## 2019-06-07 DIAGNOSIS — I5042 Chronic combined systolic (congestive) and diastolic (congestive) heart failure: Secondary | ICD-10-CM

## 2019-06-07 DIAGNOSIS — I5032 Chronic diastolic (congestive) heart failure: Secondary | ICD-10-CM | POA: Diagnosis not present

## 2019-06-07 DIAGNOSIS — I10 Essential (primary) hypertension: Secondary | ICD-10-CM | POA: Diagnosis not present

## 2019-06-07 DIAGNOSIS — I110 Hypertensive heart disease with heart failure: Secondary | ICD-10-CM | POA: Diagnosis not present

## 2019-06-07 DIAGNOSIS — Z598 Other problems related to housing and economic circumstances: Secondary | ICD-10-CM

## 2019-06-07 DIAGNOSIS — Z7901 Long term (current) use of anticoagulants: Secondary | ICD-10-CM | POA: Diagnosis not present

## 2019-06-07 DIAGNOSIS — J449 Chronic obstructive pulmonary disease, unspecified: Secondary | ICD-10-CM | POA: Diagnosis not present

## 2019-06-07 DIAGNOSIS — Z79899 Other long term (current) drug therapy: Secondary | ICD-10-CM | POA: Diagnosis not present

## 2019-06-07 DIAGNOSIS — E1121 Type 2 diabetes mellitus with diabetic nephropathy: Secondary | ICD-10-CM

## 2019-06-07 DIAGNOSIS — Z87891 Personal history of nicotine dependence: Secondary | ICD-10-CM | POA: Diagnosis not present

## 2019-06-07 DIAGNOSIS — E1169 Type 2 diabetes mellitus with other specified complication: Secondary | ICD-10-CM | POA: Diagnosis not present

## 2019-06-07 DIAGNOSIS — E1142 Type 2 diabetes mellitus with diabetic polyneuropathy: Secondary | ICD-10-CM | POA: Diagnosis not present

## 2019-06-07 DIAGNOSIS — E785 Hyperlipidemia, unspecified: Secondary | ICD-10-CM | POA: Diagnosis not present

## 2019-06-07 DIAGNOSIS — N400 Enlarged prostate without lower urinary tract symptoms: Secondary | ICD-10-CM | POA: Diagnosis not present

## 2019-06-07 LAB — GLUCOSE, CAPILLARY
Glucose-Capillary: 138 mg/dL — ABNORMAL HIGH (ref 70–99)
Glucose-Capillary: 152 mg/dL — ABNORMAL HIGH (ref 70–99)

## 2019-06-07 MED ORDER — EMPAGLIFLOZIN 10 MG TABLET
ORAL | 0 days
Start: 2019-06-07 — End: ?

## 2019-06-07 MED ORDER — EMPAGLIFLOZIN 10 MG PO TABS: 10.0000 mg | ORAL_TABLET | Freq: Every day | ORAL | 3 refills | Status: DC

## 2019-06-07 NOTE — Chronic Care Management (AMB) (Signed)
Chronic Care Management   Follow Up Note   06/07/2019 Name: Lucas Caldwell MRN: [id number] DOB: February 21, 1934  Referred by: Lucas Hauser, DO Reason for referral : Chronic Care Management (Patient Phone Call) and Care Coordination ([place] [facility])   Lucas Caldwell is a 86 y.o. year old male who is a primary care patient of Lucas Hauser, DO. The CCM team was consulted for assistance with chronic disease management and care coordination needs.  Lucas Caldwell has a past medical history including but not limited to type 2 diabetes, CKD, hyperlipidemia, COPD, atrial fibrillation, hypertension, coronary artery disease, CHF, hypothyroidism, and CML  I reached out to Lucas Caldwell and his wife by phone today.   Coordination of care call to Lucas Caldwell.  Review of patient status, including review of consultants reports, relevant laboratory and other test results, and collaboration with appropriate care team members and the patient's provider was performed as part of comprehensive patient evaluation and provision of chronic care management services.     Outpatient Encounter Medications as of 06/07/2019  Medication Sig Note  . acetaminophen (TYLENOL) 500 MG tablet Take 1,000 mg by mouth every 6 (six) hours as needed for moderate pain or headache.    . allopurinol (ZYLOPRIM) 100 MG tablet TAKE ONE TABLET BY MOUTH ONCE DAILY   . bosutinib (BOSULIF) 100 MG tablet Take 100 mg by mouth at bedtime as needed (tired/not feeling well). Take with food.  08/31/2018: Pt's wife states he only takes this as needed when he's "not feeling well"  . dapagliflozin propanediol (FARXIGA) 10 MG TABS tablet Take 10 mg by mouth daily before breakfast.   . Dulaglutide (TRULICITY) 1.22 QM/2.5OI SOPN Inject 0.75 mg into the skin once a week. (Patient taking differently: Inject 0.75 mg into the skin every Saturday. )   . gabapentin (NEURONTIN) 300 MG capsule Take 2  capsules (600 mg total) by mouth 2 (two) times daily. Max dose = 1234m daily for kidney function   . hydrALAZINE (APRESOLINE) 50 MG tablet Take 1 tablet (50 mg total) by mouth 3 (three) times daily.   . Insulin Glargine ([name] KWIKPEN) 100 UNIT/ML SOPN Inject 38 Units into the skin daily.    . isosorbide mononitrate (IMDUR) 60 MG 24 hr tablet TAKE 1 AND 1/2 TABLETS BY MOUTH ONCE DAILY   . levothyroxine (SYNTHROID) 25 MCG tablet TAKE 1 TABLET (25 MCG TOTAL) BY MOUTH DAILY BEFORE BREAKFAST.   .Marland KitchenMenthol-Zinc Oxide (GOLD BOND EX) Apply 1 application topically daily as needed (muscle pain).   . metoprolol succinate (TOPROL-XL) 25 MG 24 hr tablet Take 1 tablet (25 mg total) by mouth daily.   . Multiple Vitamins-Minerals (MULTIVITAMIN ADULTS 50+ PO) Take 1 tablet by mouth every morning.   .Marland Kitchenomeprazole (PRILOSEC) 20 MG capsule Take 20 mg by mouth 2 (two) times daily.   .[name] (SYSTANE OP) Place 1 drop into both eyes daily as needed (dry eyes).   . potassium chloride SA (KLOR-CON) 20 MEQ tablet Take 1 tablet (20 mEq total) by mouth daily. Take 3 tablets the first day only.   .Marland KitchenPROVENTIL HFA 108 (90 Base) MCG/ACT inhaler Inhale 2 puffs into the lungs every 6 (six) hours as needed for wheezing or shortness of breath.   . ranolazine (RANEXA) 500 MG 12 hr tablet TAKE ONE TABLET BY MOUTH TWICE DAILY   . rosuvastatin (CRESTOR) 5 MG tablet Take 1 tablet (5 mg total) by mouth daily.   .[name]  ticagrelor (BRILINTA) 90 MG TABS tablet Take 1 tablet (90 mg total) by mouth 2 (two) times daily.   [name] torsemide (DEMADEX) 20 MG tablet Take 3 tablets (60 mg total) by mouth 2 (two) times daily.   [name] warfarin (COUMADIN) 3 MG tablet Take 1 tablet (3 mg total) by mouth as directed.   [name] ACCU-CHEK AVIVA PLUS test strip Use to check blood sugar up to 2 times daily   . Accu-Chek Softclix Lancets lancets Use to check blood sugar up to 2 times daily.   . Alcohol Swabs 70 % PADS Use when checking blood sugar up  to 3x daily   . Blood Glucose Monitoring Suppl (ACCU-CHEK AVIVA PLUS) w/Device KIT Use to check blood sugar up to 2 times daily   . Insulin Pen Needle 31G X 5 MM MISC 31 g by Does not apply route as directed.   . loperamide (IMODIUM A-D) 2 MG tablet Take 2 mg by mouth 3 (three) times daily as needed for diarrhea or loose stools.    . nitroGLYCERIN (NITROSTAT) 0.4 MG SL tablet Place 1 tablet (0.4 mg total) under the tongue every 5 (five) minutes x 3 doses as needed for chest pain. (Patient not taking: Reported on 06/07/2019)   . polyethylene glycol (MIRALAX / GLYCOLAX) 17 g packet Take 17 g by mouth daily as needed for mild constipation.    . [DISCONTINUED] senna (SENOKOT) 8.6 MG tablet Take 1 tablet by mouth daily as needed for constipation.   . [DISCONTINUED] traMADol (ULTRAM) 50 MG tablet Take 1 tablet (50 mg total) by mouth every 8 (eight) hours as needed for moderate pain. (Patient not taking: Reported on 06/07/2019)    No facility-administered encounter medications on file as of 06/07/2019.    Goals Addressed            This Visit's Progress   . PharmD - Medication Managment       CARE PLAN ENTRY (see longitudinal plan of care for additional care plan information)  Current Barriers:  . Financial o Note patient has Partial Extra Help/LIS Subsidy . Limited vision . Chronic Disease Management support and education needs related to multiple chronic disease processes  Pharmacist Clinical Goal(s):  [name] Over the next 30 days, patient will work with CM Pharmacist to address needs related to medication adherence and diabetes management  Interventions: . Perform medication review; medication list updated in electronic medical record . Counsel on importance of medication adherence . Follow up with wife/patient regarding blood sugar monitoring and control ? Reports patient currently taking: ? [name] 38 untis daily ? Trulicity 3.14 mg once weekly ? Farxiga 10 mg once daily before  breakfast ? Patient reports AM CBGs: ? Today: 129  ? Yesterday: 136 ? Denies any recent low blood sugar results of symptoms ? Reports patient has ~5 day supply of Farxiga remaining and is unable to afford cost of refill . Review medication assistance options  ? Patient ineligible for patient assistance program for [place] as he has extra help subsidy ? Note patient currently enrolled in [employer] patient assistance program (eligible through 03/17/20), which also manufacturers Jardiance . Collaborate with Audry Riles, RPh with [facility] regarding possible switch of patient's [place] to [facility] for patient to be eligible for medication assistance. ? Lauren states that she will follow up with [employer] patient assistance program and as well as patient/wife.  Patient Self Care Activities:  . Check blood sugar, blood pressure and daily weight as directed by providers  .  Patient takes medications, with assistance of wife, as directed o Mrs. Mihok manages patient's medications using weekly pillbox as adherence tool . Patient attends scheduled medical appointments  Please see past updates related to this goal by clicking on the "Past Updates" button in the selected goal         Plan  The care management team will reach out to the patient again over the next 30 days.   [name], PharmD, [place] [employer] [phone]

## 2019-06-07 NOTE — Patient Instructions (Signed)
Thank you allowing the Chronic Care Management Team to be a part of your care! It was a pleasure speaking with you today!     CCM (Chronic Care Management) Team    Noreene Larsson RN, MSN, CCM Nurse Care Coordinator  [phone]   [name] PharmD  Clinical Pharmacist  [phone]   Eula Fried LCSW Clinical Social Worker [phone]  Visit Information  Goals Addressed            This Visit's Progress   . PharmD - Medication Managment       CARE PLAN ENTRY (see longitudinal plan of care for additional care plan information)  Current Barriers:  . Financial o Note patient has Partial Extra Help/LIS Subsidy . Limited vision . Chronic Disease Management support and education needs related to multiple chronic disease processes  Pharmacist Clinical Goal(s):  [name] Over the next 30 days, patient will work with CM Pharmacist to address needs related to medication adherence and diabetes management  Interventions: . Perform medication review; medication list updated in electronic medical record . Counsel on importance of medication adherence . Follow up with wife/patient regarding blood sugar monitoring and control ? Reports patient currently taking: ? [name] 38 untis daily ? Trulicity 3.71 mg once weekly ? Farxiga 10 mg once daily before breakfast ? Patient reports AM CBGs: ? Today: 129  ? Yesterday: 136 ? Denies any recent low blood sugar results of symptoms ? Reports patient has ~5 day supply of Farxiga remaining and is unable to afford cost of refill . Review medication assistance options  ? Patient ineligible for patient assistance program for Wilder Glade as he has extra help subsidy ? Note patient currently enrolled in [employer] patient assistance program (eligible through 03/17/20), which also manufacturers Jardiance . Collaborate with Audry Riles, RPh with [facility] regarding possible switch of patient's Wilder Glade to [facility] for patient to be  eligible for medication assistance. ? Lauren states that she will follow up with [employer] patient assistance program and as well as patient/wife.  Patient Self Care Activities:  . Check blood sugar, blood pressure and daily weight as directed by providers  . Patient takes medications, with assistance of wife, as directed o Mrs. Pfeifer manages patient's medications using weekly pillbox as adherence tool . Patient attends scheduled medical appointments  Please see past updates related to this goal by clicking on the "Past Updates" button in the selected goal         Patient verbalizes understanding of instructions provided today.   The care management team will reach out to the patient again over the next 30 days.   [name], PharmD, [place] [employer] [phone]

## 2019-06-07 NOTE — Progress Notes (Signed)
Daily Session Note  Patient Details  Name: Lucas Caldwell MRN: [id number] Date of Birth: March 31, 1933 Referring Provider:     Cardiac Rehab from 05/20/2019 in [facility] Cardiac and Pulmonary Rehab  Referring Provider  Bensimhon      Encounter Date: 06/07/2019  Check In: Session Check In - 06/07/19 1341      Check-In   Supervising physician immediately available to respond to emergencies  See telemetry face sheet for immediately available ER MD    Location  [facility]-Cardiac & Pulmonary Rehab    Staff Present  Renita Papa, RN Moises Blood, BS, ACSM CEP, Exercise Physiologist;Jessica [place], MA, RCEP, CCRP, CCET    Virtual Visit  No    Medication changes reported      No    Fall or balance concerns reported     No    Warm-up and Cool-down  Performed on first and last piece of equipment    Resistance Training Performed  Yes    VAD Patient?  No    PAD/SET Patient?  No      Pain Assessment   Currently in Pain?  No/denies          Social History   Tobacco Use  Smoking Status Former Smoker  . Packs/day: 2.00  . Years: 15.00  . Pack years: 30.00  . Types: Cigarettes  . Quit date: 25  . Years since quitting: 42.2  Smokeless Tobacco Former Systems developer  Tobacco Comment   quit 1979    Goals Met:  Independence with exercise equipment Exercise tolerated well No report of cardiac concerns or symptoms Strength training completed today  Goals Unmet:  Not Applicable  Comments: Pt able to follow exercise prescription today without complaint.  Will continue to monitor for progression.    Dr. Emily Filbert is Medical Director for [place] and [name].

## 2019-06-07 NOTE — Telephone Encounter (Signed)
Received message that patient was unable to afford [place]. Patient is already enrolled in [place] patient assistance program for Spiriva. As this company also makes Vania Rea, will also enroll him in their Jardiance patient assistance program. Will have him stop [place] and start Jardiance 10 mg daily. Will send [facility] new prescription (fax: [phone]). [name] is aware of change.   Audry Riles, PharmD, BCPS, BCCP, CPP [facility] Pharmacist [phone]

## 2019-06-08 ENCOUNTER — Other Ambulatory Visit: Payer: Self-pay | Admitting: Family Medicine

## 2019-06-08 ENCOUNTER — Ambulatory Visit: Payer: Medicare HMO | Attending: Internal Medicine

## 2019-06-08 ENCOUNTER — Ambulatory Visit (INDEPENDENT_AMBULATORY_CARE_PROVIDER_SITE_OTHER): Payer: Medicare HMO | Admitting: Family Medicine

## 2019-06-08 ENCOUNTER — Encounter: Payer: Self-pay | Admitting: Family Medicine

## 2019-06-08 ENCOUNTER — Other Ambulatory Visit: Payer: Self-pay

## 2019-06-08 VITALS — BP 131/63 | HR 82 | Resp 16 | Ht 65.0 in | Wt 204.6 lb

## 2019-06-08 DIAGNOSIS — C9211 Chronic myeloid leukemia, BCR/ABL-positive, in remission: Secondary | ICD-10-CM

## 2019-06-08 DIAGNOSIS — M1A39X0 Chronic gout due to renal impairment, multiple sites, without tophus (tophi): Secondary | ICD-10-CM

## 2019-06-08 DIAGNOSIS — Z794 Long term (current) use of insulin: Secondary | ICD-10-CM | POA: Diagnosis not present

## 2019-06-08 DIAGNOSIS — I10 Essential (primary) hypertension: Secondary | ICD-10-CM

## 2019-06-08 DIAGNOSIS — E1121 Type 2 diabetes mellitus with diabetic nephropathy: Secondary | ICD-10-CM

## 2019-06-08 DIAGNOSIS — E1169 Type 2 diabetes mellitus with other specified complication: Secondary | ICD-10-CM

## 2019-06-08 DIAGNOSIS — Z89411 Acquired absence of right great toe: Secondary | ICD-10-CM | POA: Diagnosis not present

## 2019-06-08 DIAGNOSIS — E1142 Type 2 diabetes mellitus with diabetic polyneuropathy: Secondary | ICD-10-CM | POA: Diagnosis not present

## 2019-06-08 DIAGNOSIS — I5032 Chronic diastolic (congestive) heart failure: Secondary | ICD-10-CM

## 2019-06-08 DIAGNOSIS — Z23 Encounter for immunization: Secondary | ICD-10-CM

## 2019-06-08 DIAGNOSIS — E785 Hyperlipidemia, unspecified: Secondary | ICD-10-CM

## 2019-06-08 DIAGNOSIS — J432 Centrilobular emphysema: Secondary | ICD-10-CM

## 2019-06-08 DIAGNOSIS — N1831 Chronic kidney disease, stage 3a: Secondary | ICD-10-CM

## 2019-06-08 MED ORDER — TRAMADOL HCL 50 MG PO TABS: 50.0000 mg | ORAL_TABLET | Freq: Every evening | ORAL | 2 refills | Status: DC | PRN

## 2019-06-08 NOTE — Patient Instructions (Addendum)
Thank you for coming to the office today.  Start Tramadol pain pill in evening at bedtime IF NEEDED ONLY - take 1 pill or in future if need you can increase to 2 pills as needed.  Safe to take with Gabapentin.  Likely your pain is from nerves neuropathy  Bring [place] the form from [employer] for Diabetic shoes.  DUE for FASTING BLOOD WORK (no food or drink after midnight before the lab appointment, only water or coffee without cream/sugar on the morning of)  SCHEDULE "Lab Only" visit in the morning at the clinic for lab draw in 3 MONTHS   - Make sure Lab Only appointment is at about 1 week before your next appointment, so that results will be available  For Lab Results, once available within 2-3 days of blood draw, you can can log in to MyChart online to view your results and a brief explanation. Also, we can discuss results at next follow-up visit.   Please schedule a Follow-up Appointment to: Return in about 3 months (around 09/08/2019) for 3 month follow-up Diabetes, Neuropathy, Lab results.  If you have any other questions or concerns, please feel free to call the office or send a message through [facility]. You may also schedule an earlier appointment if necessary.  Additionally, you may be receiving a survey about your experience at our office within a few days to 1 week by e-mail or mail. We value your feedback.  [name], DO [place]

## 2019-06-08 NOTE — Addendum Note (Signed)
Addended by: [name] on: 06/08/2019 06:09 PM   Modules accepted: Orders

## 2019-06-08 NOTE — Progress Notes (Signed)
Subjective:    Patient ID: Lucas Caldwell, male    DOB: [birth year], 86 y.o.   MRN: [id number]  Lucas Caldwell is a 86 y.o. male presenting on 06/08/2019 for Foot Pain (Right side SOB using 4 liter of O2)  History provided by patient and wife Lucas Caldwell.  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 [name] doing well with sugar now much improved Working w Lucas Caldwell RPH CBG readings avg [phone] Meds:- Trulicity 0.75mg  weekly inj Saturday, [name] 38u daily (med assist) Reports good compliance. Tolerating well w/o side-effects Currently OFF ACEi (Enalapril DC'd by Cardiology) Lifestyle: - Diet (improving DM diet, drinks some water) - Exercise (limited by dyspnea) - Admits chronicDM Neuropathywith pain in feet bilateral,on Gabapentin with improvement, taking 300mg  x 2 pills twice a day but still has neuropathy burning pain worse at night. Failed Lyrica in past. Only tried Tramadol temporary - interested again for pain med. - Followed by Podiatry, he is s/p R great toe amputation partial - [facility] Previously ordered diabetic shoes through [employer] - now he request a new pair - will get order form to [place]. He plans to return to Dr Elv[facility] for nails. - Denies hypoglycemia, nausea vomiting, fever chills, swelling, polyuria   [name] continuing on Cardiac Rehab currently with good results. He does use oxygen with exertion.  Depression screen [facility] 2/9 05/20/2019 02/02/2019 11/24/2018  Decreased Interest 1 0 0  Down, Depressed, Hopeless 2 0 0  PHQ - 2 Score 3 0 0  Altered sleeping 2 0 -  Tired, decreased energy 3 0 -  Change in appetite 2 0 -  Feeling bad or failure about yourself  2 0 -  Trouble concentrating 2 0 -  Moving slowly or fidgety/restless 2 0 -  Suicidal thoughts 0 0 -  PHQ-9 Score 16 0 -  Difficult doing work/chores Somewhat difficult - -  Some recent data  might be hidden    Social History   Tobacco Use  . Smoking status: Former Smoker    Packs/day: 2.00    Years: 15.00    Pack years: 30.00    Types: Cigarettes    Quit date: 1979    Years since quitting: 42.2  . Smokeless tobacco: Former Systems developer  . Tobacco comment: quit 1979  Substance Use Topics  . Alcohol use: No    Comment: previously drank heavily - quit 1979.  . Drug use: No    Review of Systems Per HPI unless specifically indicated above     Objective:    BP 131/63   Pulse 82   Resp 16   Ht 5\' 5"  (1.651 m)   Wt 204 lb 9.6 oz (92.8 kg)   SpO2 97%   BMI 34.05 kg/m   Wt Readings from Last 3 Encounters:  06/08/19 204 lb 9.6 oz (92.8 kg)  05/26/19 201 lb (91.2 kg)  05/20/19 206 lb (93.4 kg)    Physical Exam Vitals and nursing note reviewed.  Constitutional:      General: [name] not in acute distress.    Appearance: [name] well-developed. [name] obese. [name] not diaphoretic.     Comments: Well-appearing, comfortable, cooperative  HENT:     Head: Normocephalic and atraumatic.  Eyes:     General:        Right eye: No discharge.  Left eye: No discharge.     Conjunctiva/sclera: Conjunctivae normal.  Cardiovascular:     Rate and Rhythm: Normal rate.  Pulmonary:     Effort: Pulmonary effort is normal.  Musculoskeletal:     Right lower leg: No edema.     Left lower leg: No edema.     Comments: See foot exam.  Skin:    General: Skin is warm and dry.     Findings: No erythema or rash.  Neurological:     Mental Status: [name] alert and oriented to person, place, and time.  Psychiatric:        Behavior: Behavior normal.     Comments: Well groomed, good eye contact, normal speech and thoughts      Diabetic Foot Exam - Simple   Simple Foot Form Diabetic Foot exam was performed with the following findings: Yes 06/08/2019  4:15 PM  Visual Inspection See comments: Yes Sensation Testing See comments: Yes Pulse Check Posterior Tibialis and Dorsalis pulse  intact bilaterally: Yes Comments Right great toe s/p partial amputation, no longer has nail. Well healed. Other toenails 2nd toe slightly increased thickness. No other deformity. Generalized bilateral feet with dramatic reduced monofilament sensation absent in most areas, some preserved mild sensation to monofilament mid foot bilateral. Some callus formation with neuropathy. No ulceration.     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 We agree to try it again, now focus on night-time dosing PRN can increase from 50mg  to 100mg  (2 pills max dose) nightly PRN if needed, can continue current gabapentin dose. - New rx sent Tramadol, caution sedation and reviewed benefit risks - may consider a longer term option for him if indicated, this is safer alternative compared to NSAIDs for this high risk patient with cardiovascular disease and CKD. - Continue Gabapentin 600mg  BID, cannot dose  higher due to CKD - Considered SNRI Duloxetine for neuropathy but would likely take weeks and ultimately we decided to avoid a daily medication  Will complete paperwork from [place] for DM shoes once I receive it from patient.  Completed DM Foot Exam today in office. See exam note.  - Patient would benefit from Diabetic Shoes due to R great toe deformity and neuropathy with callus formation and great toe deformity causing pain, diabetes control is improving on current regimen, and I am continuing to monitor and manage diabetes.   Will CC chart to [facility]  [facility] RPH as well to notify her of new med change and also - patient asking about reviewing his meds to help figure out if anything he doesn't need anymore, I will collaborate with her on this. One possibility would be to limit his Allopurinol in future if upcoming lab work shows gout is very well controlled or not a problem.    Meds ordered this encounter  Medications  . traMADol (ULTRAM) 50 MG tablet    Sig: Take 1-2 tablets (50-100 mg total) by mouth at bedtime as needed.    Dispense:  30 tablet    Refill:  2      Follow up plan: Return in about 3 months (around 09/08/2019) for 3 month follow-up Diabetes, Neuropathy, Lab results.  Future labs ordered for  Uric Acid A1c all labs   Lucas Putnam, DO [facility] 06/08/2019, 3:59 PM

## 2019-06-08 NOTE — Progress Notes (Signed)
   Covid-19 Vaccination Clinic  Name:  AZIEL MORGAN    MRN: [id number] DOB: 1933/12/28  06/08/2019  Mr. Mccahill was observed post Covid-19 immunization for 15 minutes without incident. He was provided with Vaccine Information Sheet and instruction to access the V-Safe system.   Mr. Spychalski was instructed to call 911 with any severe reactions post vaccine: [name] Difficulty breathing  . Swelling of face and throat  . A fast heartbeat  . A bad rash all over body  . Dizziness and weakness   Immunizations Administered    Name Date Dose VIS Date Route   Pfizer COVID-19 Vaccine 06/08/2019  1:51 PM 0.3 mL 02/26/2019 Intramuscular   Manufacturer: [employer], [employer]   Lot: [id number]   [place]: [id number]

## 2019-06-09 ENCOUNTER — Encounter: Payer: Self-pay | Admitting: *Deleted

## 2019-06-09 ENCOUNTER — Encounter: Payer: Medicare HMO | Admitting: *Deleted

## 2019-06-09 ENCOUNTER — Other Ambulatory Visit: Payer: Medicare HMO

## 2019-06-09 DIAGNOSIS — Z7901 Long term (current) use of anticoagulants: Secondary | ICD-10-CM | POA: Diagnosis not present

## 2019-06-09 DIAGNOSIS — I5042 Chronic combined systolic (congestive) and diastolic (congestive) heart failure: Secondary | ICD-10-CM

## 2019-06-09 DIAGNOSIS — Z7982 Long term (current) use of aspirin: Secondary | ICD-10-CM | POA: Diagnosis not present

## 2019-06-09 DIAGNOSIS — Z79899 Other long term (current) drug therapy: Secondary | ICD-10-CM | POA: Diagnosis not present

## 2019-06-09 DIAGNOSIS — I5022 Chronic systolic (congestive) heart failure: Secondary | ICD-10-CM

## 2019-06-09 DIAGNOSIS — E119 Type 2 diabetes mellitus without complications: Secondary | ICD-10-CM | POA: Diagnosis not present

## 2019-06-09 DIAGNOSIS — Z87891 Personal history of nicotine dependence: Secondary | ICD-10-CM | POA: Diagnosis not present

## 2019-06-09 DIAGNOSIS — I110 Hypertensive heart disease with heart failure: Secondary | ICD-10-CM | POA: Diagnosis not present

## 2019-06-09 DIAGNOSIS — N400 Enlarged prostate without lower urinary tract symptoms: Secondary | ICD-10-CM | POA: Diagnosis not present

## 2019-06-09 DIAGNOSIS — Z794 Long term (current) use of insulin: Secondary | ICD-10-CM | POA: Diagnosis not present

## 2019-06-09 NOTE — Progress Notes (Signed)
Daily Session Note  Patient Details  Name: Lucas Caldwell MRN: [id number] Date of Birth: Oct 19, 1933 Referring Provider:     Cardiac Rehab from 05/20/2019 in [facility] Cardiac and Pulmonary Rehab  Referring Provider  Bensimhon      Encounter Date: 06/09/2019  Check In: Session Check In - 06/09/19 1407      Check-In   Supervising physician immediately available to respond to emergencies  See telemetry face sheet for immediately available ER MD    Location  [facility]-Cardiac & Pulmonary Rehab    Staff Present  Renita Papa, RN BSN;Joseph Foy [place], [place], ACSM CEP, Exercise Physiologist;Melissa Caiola RDN, LDN    Virtual Visit  No    Medication changes reported      No    Fall or balance concerns reported     No    Warm-up and Cool-down  Performed on first and last piece of equipment    Resistance Training Performed  Yes    VAD Patient?  No    PAD/SET Patient?  No      Pain Assessment   Currently in Pain?  No/denies          Social History   Tobacco Use  Smoking Status Former Smoker  . Packs/day: 2.00  . Years: 15.00  . Pack years: 30.00  . Types: Cigarettes  . Quit date: 72  . Years since quitting: 42.2  Smokeless Tobacco Former Systems developer  Tobacco Comment   quit 1979    Goals Met:  Independence with exercise equipment Exercise tolerated well No report of cardiac concerns or symptoms Strength training completed today  Goals Unmet:  Not Applicable  Comments: Pt able to follow exercise prescription today without complaint.  Will continue to monitor for progression.    Dr. Emily Filbert is Medical Director for [place] and [name].

## 2019-06-09 NOTE — Progress Notes (Signed)
Cardiac Individual Treatment Plan  Patient Details  Name: Lucas Caldwell MRN: [id number] Date of Birth: November 18, 1933 Referring Provider:     Cardiac Rehab from 05/20/2019 in [facility] Cardiac and Pulmonary Rehab  Referring Provider  Bensimhon      Initial Encounter Date:    Cardiac Rehab from 05/20/2019 in [facility] Cardiac and Pulmonary Rehab  Date  05/20/19      Visit Diagnosis: Chronic combined systolic and diastolic congestive heart failure ([place])  Patient's Home Medications on Admission:  Current Outpatient Medications:  .  ACCU-CHEK AVIVA PLUS test strip, Use to check blood sugar up to 2 times daily, Disp: 200 each, Rfl: 3 .  Accu-Chek Softclix Lancets lancets, Use to check blood sugar up to 2 times daily., Disp: 200 each, Rfl: 3 .  acetaminophen (TYLENOL) 500 MG tablet, Take 1,000 mg by mouth every 6 (six) hours as needed for moderate pain or headache. , Disp: , Rfl:  .  Alcohol Swabs 70 % PADS, Use when checking blood sugar up to 3x daily, Disp: 100 each, Rfl: 11 .  allopurinol (ZYLOPRIM) 100 MG tablet, TAKE ONE TABLET BY MOUTH ONCE DAILY, Disp: 90 tablet, Rfl: 3 .  Blood Glucose Monitoring Suppl (ACCU-CHEK AVIVA PLUS) w/Device KIT, Use to check blood sugar up to 2 times daily, Disp: 1 kit, Rfl: 0 .  bosutinib (BOSULIF) 100 MG tablet, Take 100 mg by mouth at bedtime as needed (tired/not feeling well). Take with food. , Disp: , Rfl:  .  Dulaglutide (TRULICITY) 3.00 PQ/3.3AQ SOPN, Inject 0.75 mg into the skin once a week. (Patient taking differently: Inject 0.75 mg into the skin every Saturday. ), Disp: 4 pen, Rfl: 0 .  empagliflozin (JARDIANCE) 10 MG TABS tablet, Take 10 mg by mouth daily before breakfast., Disp: 90 tablet, Rfl: 3 .  gabapentin (NEURONTIN) 300 MG capsule, Take 2 capsules (600 mg total) by mouth 2 (two) times daily. Max dose = 1240m daily for kidney function, Disp: 360 capsule, Rfl: 1 .  hydrALAZINE (APRESOLINE) 50 MG tablet, Take 1 tablet (50 mg total) by mouth 3 (three)  times daily., Disp: 270 tablet, Rfl: 2 .  Insulin Glargine ([name] KWIKPEN) 100 UNIT/ML SOPN, Inject 38 Units into the skin daily. , Disp: , Rfl:  .  Insulin Pen Needle 31G X 5 MM MISC, 31 g by Does not apply route as directed., Disp: 100 each, Rfl: 5 .  isosorbide mononitrate (IMDUR) 60 MG 24 hr tablet, TAKE 1 AND 1/2 TABLETS BY MOUTH ONCE DAILY, Disp: 135 tablet, Rfl: 0 .  levothyroxine (SYNTHROID) 25 MCG tablet, TAKE 1 TABLET (25 MCG TOTAL) BY MOUTH DAILY BEFORE BREAKFAST., Disp: 90 tablet, Rfl: 1 .  loperamide (IMODIUM A-D) 2 MG tablet, Take 2 mg by mouth 3 (three) times daily as needed for diarrhea or loose stools. , Disp: , Rfl:  .  Menthol-Zinc Oxide (GOLD BOND EX), Apply 1 application topically daily as needed (muscle pain)., Disp: , Rfl:  .  metoprolol succinate (TOPROL-XL) 25 MG 24 hr tablet, Take 1 tablet (25 mg total) by mouth daily., Disp: 30 tablet, Rfl: 1 .  Multiple Vitamins-Minerals (MULTIVITAMIN ADULTS 50+ PO), Take 1 tablet by mouth every morning., Disp: , Rfl:  .  nitroGLYCERIN (NITROSTAT) 0.4 MG SL tablet, Place 1 tablet (0.4 mg total) under the tongue every 5 (five) minutes x 3 doses as needed for chest pain., Disp: 25 tablet, Rfl: 4 .  omeprazole (PRILOSEC) 20 MG capsule, Take 20 mg by mouth 2 (two) times daily.,  Disp: , Rfl:  .  Polyethyl Glycol-Propyl Glycol (SYSTANE OP), Place 1 drop into both eyes daily as needed (dry eyes)., Disp: , Rfl:  .  polyethylene glycol (MIRALAX / GLYCOLAX) 17 g packet, Take 17 g by mouth daily as needed for mild constipation. , Disp: , Rfl:  .  potassium chloride SA (KLOR-CON) 20 MEQ tablet, Take 1 tablet (20 mEq total) by mouth daily. Take 3 tablets the first day only., Disp: 90 tablet, Rfl: 3 .  PROVENTIL HFA 108 (90 Base) MCG/ACT inhaler, Inhale 2 puffs into the lungs every 6 (six) hours as needed for wheezing or shortness of breath., Disp: , Rfl:  .  ranolazine (RANEXA) 500 MG 12 hr tablet, TAKE ONE TABLET BY MOUTH TWICE DAILY, Disp: 180  tablet, Rfl: 0 .  rosuvastatin (CRESTOR) 5 MG tablet, Take 1 tablet (5 mg total) by mouth daily., Disp: 90 tablet, Rfl: 3 .  ticagrelor (BRILINTA) 90 MG TABS tablet, Take 1 tablet (90 mg total) by mouth 2 (two) times daily., Disp: 60 tablet, Rfl: 6 .  torsemide (DEMADEX) 20 MG tablet, Take 3 tablets (60 mg total) by mouth 2 (two) times daily., Disp: 180 tablet, Rfl: 2 .  traMADol (ULTRAM) 50 MG tablet, Take 1-2 tablets (50-100 mg total) by mouth at bedtime as needed., Disp: 30 tablet, Rfl: 2 .  warfarin (COUMADIN) 3 MG tablet, Take 1 tablet (3 mg total) by mouth as directed., Disp: 45 tablet, Rfl: 1  Past Medical History: Past Medical History:  Diagnosis Date  . Arthritis    "probably in his legs" (08/15/2016)  . Blind right eye   . BPH (benign prostatic hyperplasia)   . Breast asymmetry    Left breast is larger, present for several years.  [name] CAD (coronary artery disease)    a. Cath in the late 90's - reportedly ok;  b. 2014 s/p stenting x 2 @ [facility]; c 08/19/16 Cath/PCI with DES -> RCA, plan to treat LM 70% medically. Seen by surgery and felt to be too high risk for CABG; d. 08/2018 Cath: LM 70 (iFR 0.86), LAD 20ost, 40p, 50/37m D1 70, LCX patent stent, OM1 30, RCA patent stent, 483mSR, 40d, RPDA 30. PCWP 8. CO/CI 4.0/2.1.  . Marland Kitchenhronic combined systolic (congestive) and diastolic (congestive) heart failure ([place]   a. Previously reduced EF-->50% by echo in 2012;  b. 06/2015 Echo: EF 50-55%  c. 07/2016 Echo: EF 45-50%; d. 12/2017 Echo: EF 55%, Gr1 DD, mild MR; e. 08/2018 Echo: EF 35-40%, mildly dil PA.  . Marland Kitchenhronic kidney disease (CKD), stage IV (severe) ([facility]  . CML (chronic myelocytic leukemia) ([facility]  . GERD (gastroesophageal reflux disease)   . GIB (gastrointestinal bleeding)    a. 12/2017 3 unit PRBC GIB in setting of coumadin-->Endo/colon multiple duodenal ulcers and a single bleeding ulcer in the proximal ascending colon status post hemostatic clipping x2.  . Gout   . Hyperlipemia   .  Hypertension   . Hypothyroidism   . Iron deficiency anemia   . Ischemic cardiomyopathy    a. Previously reduced EF-->50% by echo in 2012;  b. 06/2015 Echo: EF 50-55%, Gr2 DD, mild MR, mildly dil LA, Ao sclerosis, mild TR;  c. 07/2016 Echo: EF 45-50%; d. 12/2017 Echo: EF 55%, Gr1 DD, mild MR; e. 08/2018 Echo: EF 35-40%.  . Migraine    "in the 1960s" (08/15/2016)  . PAF (paroxysmal atrial fibrillation) (HCC)    a. ? Dx 2014-->s/p DCCV;  b. CHA2DS2VASc =  6--> Coumadin.  . Prostate cancer ([place])   . RBBB   . Type II diabetes mellitus (HCC)     Tobacco Use: Social History   Tobacco Use  Smoking Status Former Smoker  . Packs/day: 2.00  . Years: 15.00  . Pack years: 30.00  . Types: Cigarettes  . Quit date: 51  . Years since quitting: 42.2  Smokeless Tobacco Former Systems developer  Tobacco Comment   quit 1979    Labs: Recent Review [facility] Latest Ref Rng & Units 05/06/2018 08/24/2018 08/28/2018 02/02/2019 04/13/2019   Cholestrol 0 - 200 mg/dL - - 110 - -   LDLCALC 0 - 99 mg/dL - - 51 - -   HDL >40 mg/dL - - 28(L) - -   Trlycerides <150 mg/dL - - 155(H) - -   Hemoglobin A1c 4.8 - 5.6 % 8.7(A) 8.3(A) - 6.6(A) 7.6(H)       Exercise Target Goals: Exercise Program Goal: Individual exercise prescription set using results from initial 6 min walk test and THRR while considering  patient's activity barriers and safety.   Exercise Prescription Goal: Initial exercise prescription builds to 30-45 minutes a day of aerobic activity, 2-3 days per week.  Home exercise guidelines will be given to patient during program as part of exercise prescription that the participant will acknowledge.  Activity Barriers & Risk Stratification: Activity Barriers & Cardiac Risk Stratification - 05/19/19 0913      Activity Barriers & Cardiac Risk Stratification   Activity Barriers  Arthritis;Joint Problems;Deconditioning;Muscular Weakness;Shortness of Breath;Assistive  Device   knee pain, uses walker   Cardiac Risk Stratification  High       6 Minute Walk: 6 Minute Walk    Row Name 05/20/19 1539         6 Minute Walk   Distance  715 feet     Walk Time  5.35 minutes     # of Rest Breaks  1     MPH  1.54     METS  1.14     RPE  15     Perceived Dyspnea   2     VO2 Peak  4     Symptoms  Yes (comment)     Comments  legs "going to sleep" from knees down     Resting HR  95 bpm     Resting BP  114/70     Resting Oxygen Saturation   98 %     Exercise Oxygen Saturation  during 6 min walk  92 %     Max Ex. HR  107 bpm     Max Ex. BP  154/68     2 Minute Post BP  124/68        Oxygen Initial Assessment:   Oxygen Re-Evaluation:   Oxygen Discharge (Final Oxygen Re-Evaluation):   Initial Exercise Prescription: Initial Exercise Prescription - 05/20/19 1500      Date of Initial Exercise RX and Referring Provider   Date  05/20/19    Referring Provider  Bensimhon      Treadmill   MPH  1    Grade  0    Minutes  15      REL-XR   Level  1    Speed  50    Minutes  15    METs  1      Prescription Details   Frequency (times per week)  3  Duration  Progress to 30 minutes of continuous aerobic without signs/symptoms of physical distress      Intensity   THRR 40-80% of Max Heartrate  111-128    Ratings of Perceived Exertion  11-13    Perceived Dyspnea  0-4      Resistance Training   Training Prescription  Yes    Weight  3 lb    Reps  10-15       Perform Capillary Blood Glucose checks as needed.  Exercise Prescription Changes: Exercise Prescription Changes    Row Name 05/20/19 1500             Response to Exercise   Blood Pressure (Admit)  114/70       Blood Pressure (Exercise)  154/68       Blood Pressure (Exit)  124/68       Heart Rate (Admit)  95 bpm       Heart Rate (Exercise)  107 bpm       Heart Rate (Exit)  90 bpm       Oxygen Saturation (Admit)  98 %       Oxygen Saturation (Exercise)  92 %       Oxygen  Saturation (Exit)  96 %       Rating of Perceived Exertion (Exercise)  15       Perceived Dyspnea (Exercise)  2       Symptoms  legs "going to sleep"       Comments  L shoulder old injury          Exercise Comments: Exercise Comments    Row Name 06/02/19 1353           Exercise Comments  First full day of exercise!  Patient was oriented to gym and equipment including functions, settings, policies, and procedures.  Patient's individual exercise prescription and treatment plan were reviewed.  All starting workloads were established based on the results of the 6 minute walk test done at initial orientation visit.  The plan for exercise progression was also introduced and progression will be customized based on patient's performance and goals.          Exercise Goals and Review: Exercise Goals    Row Name 05/20/19 1544             Exercise Goals   Increase Physical Activity  Yes       Intervention  Provide advice, education, support and counseling about physical activity/exercise needs.;Develop an individualized exercise prescription for aerobic and resistive training based on initial evaluation findings, risk stratification, comorbidities and participant's personal goals.       Expected Outcomes  Short Term: Attend rehab on a regular basis to increase amount of physical activity.;Long Term: Add in home exercise to make exercise part of routine and to increase amount of physical activity.;Long Term: Exercising regularly at least 3-5 days a week.       Increase Strength and Stamina  Yes       Intervention  Provide advice, education, support and counseling about physical activity/exercise needs.;Develop an individualized exercise prescription for aerobic and resistive training based on initial evaluation findings, risk stratification, comorbidities and participant's personal goals.       Expected Outcomes  Short Term: Increase workloads from initial exercise prescription for resistance,  speed, and METs.;Short Term: Perform resistance training exercises routinely during rehab and add in resistance training at home;Long Term: Improve cardiorespiratory fitness, muscular endurance and strength as measured by increased METs  and functional capacity (6MWT)       Able to understand and use rate of perceived exertion (RPE) scale  Yes       Intervention  Provide education and explanation on how to use RPE scale       Expected Outcomes  Short Term: Able to use RPE daily in rehab to express subjective intensity level;Long Term:  Able to use RPE to guide intensity level when exercising independently       Able to understand and use Dyspnea scale  Yes       Intervention  Provide education and explanation on how to use Dyspnea scale       Expected Outcomes  Short Term: Able to use Dyspnea scale daily in rehab to express subjective sense of shortness of breath during exertion;Long Term: Able to use Dyspnea scale to guide intensity level when exercising independently       Knowledge and understanding of Target Heart Rate Range (THRR)  Yes       Intervention  Provide education and explanation of THRR including how the numbers were predicted and where they are located for reference       Expected Outcomes  Short Term: Able to state/look up THRR;Short Term: Able to use daily as guideline for intensity in rehab;Long Term: Able to use THRR to govern intensity when exercising independently       Able to check pulse independently  Yes       Intervention  Provide education and demonstration on how to check pulse in carotid and radial arteries.;Review the importance of being able to check your own pulse for safety during independent exercise       Expected Outcomes  Short Term: Able to explain why pulse checking is important during independent exercise;Long Term: Able to check pulse independently and accurately       Understanding of Exercise Prescription  Yes       Intervention  Provide education,  explanation, and written materials on patient's individual exercise prescription       Expected Outcomes  Short Term: Able to explain program exercise prescription;Long Term: Able to explain home exercise prescription to exercise independently          Exercise Goals Re-Evaluation : Exercise Goals Re-Evaluation    Row Name 06/02/19 1353             Exercise Goal Re-Evaluation   Exercise Goals Review  Able to understand and use rate of perceived exertion (RPE) scale;Understanding of Exercise Prescription;Knowledge and understanding of Target Heart Rate Range (THRR)       Comments  Reviewed RPE scale, THR and program prescription with pt today.  Pt voiced understanding and was given a copy of goals to take home.       Expected Outcomes  Short: Use RPE daily to regulate intensity. Long: Follow program prescription in THR.          Discharge Exercise Prescription (Final Exercise Prescription Changes): Exercise Prescription Changes - 05/20/19 1500      Response to Exercise   Blood Pressure (Admit)  114/70    Blood Pressure (Exercise)  154/68    Blood Pressure (Exit)  124/68    Heart Rate (Admit)  95 bpm    Heart Rate (Exercise)  107 bpm    Heart Rate (Exit)  90 bpm    Oxygen Saturation (Admit)  98 %    Oxygen Saturation (Exercise)  92 %    Oxygen Saturation (Exit)  96 %  Rating of Perceived Exertion (Exercise)  15    Perceived Dyspnea (Exercise)  2    Symptoms  legs "going to sleep"    Comments  L shoulder old injury       Nutrition:  Target Goals: Understanding of nutrition guidelines, daily intake of sodium <1547m, cholesterol <2041m calories 30% from fat and 7% or less from saturated fats, daily to have 5 or more servings of fruits and vegetables.  Biometrics: Pre Biometrics - 05/20/19 1545      Pre Biometrics   Height  '5\' 7"'  (1.702 m)    Weight  206 lb (93.4 kg)    BMI (Calculated)  32.26        Nutrition Therapy Plan and Nutrition Goals: Nutrition Therapy &  Goals - 05/24/19 0904      Nutrition Therapy   Diet  HH, Low Na, DM    Drug/Food Interactions  Coumadin/Vit K    Protein (specify units)  80-85g    Fiber  30 grams    Whole Grain Foods  3 servings    Saturated Fats  12 max. grams    Fruits and Vegetables  5 servings/day    Sodium  1.5 grams      Personal Nutrition Goals   Nutrition Goal  ST: have one protein food at each meal. Small frequent meals. LT: improving SOB and DOE. 4/10    Comments  Pt reports needing help. SOB with eating. Not much appetite. eggs and grits in am. turnip greens and green beans. Canned peaches. Discussed protein sources with wife and pt. Discussed how right now the focus is on protein and calorie needs as pt is not eating due to SOB, but discussed some HH and DM options as well. Wife goignt o try BBG Chicken. Pt reports not eating many foods high in protein. Fried chicken legs last night with some peaches, BG this morning was 188, usually around 130-150. BG stable throught the day. Discussed HH, DM friendly eating. Discussed protein and calorie needs and how to meet them with SOB such as small frequent meals. Hard for pt to hear, will talk more during class time in person.      Intervention Plan   Intervention  Prescribe, educate and counsel regarding individualized specific dietary modifications aiming towards targeted core components such as weight, hypertension, lipid management, diabetes, heart failure and other comorbidities.;Nutrition handout(s) given to patient.    Expected Outcomes  Short Term Goal: Understand basic principles of dietary content, such as calories, fat, sodium, cholesterol and nutrients.;Short Term Goal: A plan has been developed with personal nutrition goals set during dietitian appointment.;Long Term Goal: Adherence to prescribed nutrition plan.       Nutrition Assessments: Nutrition Assessments - 05/20/19 1652      MEDFICTS Scores   Pre Score  46       Nutrition Goals  Re-Evaluation:   Nutrition Goals Discharge (Final Nutrition Goals Re-Evaluation):   Psychosocial: Target Goals: Acknowledge presence or absence of significant depression and/or stress, maximize coping skills, provide positive support system. Participant is able to verbalize types and ability to use techniques and skills needed for reducing stress and depression.   Initial Review & Psychosocial Screening: Initial Psych Review & Screening - 05/19/19 0914      Initial Review   Current issues with  Current Stress Concerns    Source of Stress Concerns  Chronic Illness;Unable to participate in former interests or hobbies;Unable to perform yard/household activities    Comments  Wants  more energy to do more, less SOB, wearing oxygen and using cane      Family Dynamics   Good Support System?  Yes   wife, son, daughter     Barriers   Psychosocial barriers to participate in program  Psychosocial barriers identified (see note);The patient should benefit from training in stress management and relaxation.      Screening Interventions   Interventions  Encouraged to exercise;To provide support and resources with identified psychosocial needs;Provide feedback about the scores to participant    Expected Outcomes  Short Term goal: Utilizing psychosocial counselor, staff and physician to assist with identification of specific Stressors or current issues interfering with healing process. Setting desired goal for each stressor or current issue identified.;Long Term Goal: Stressors or current issues are controlled or eliminated.;Short Term goal: Identification and review with participant of any Quality of Life or Depression concerns found by scoring the questionnaire.;Long Term goal: The participant improves quality of Life and PHQ9 Scores as seen by post scores and/or verbalization of changes       Quality of Life Scores:  Quality of Life - 05/20/19 1545      Quality of Life   Select  Quality of Life       Quality of Life Scores   Health/Function Pre  20.14 %    Socioeconomic Pre  30 %    Psych/Spiritual Pre  30 %    Family Pre  30 %    GLOBAL Pre  25.94 %      Scores of 19 and below usually indicate a poorer quality of life in these areas.  A difference of  2-3 points is a clinically meaningful difference.  A difference of 2-3 points in the total score of the Quality of Life Index has been associated with significant improvement in overall quality of life, self-image, physical symptoms, and general health in studies assessing change in quality of life.  PHQ-9: Recent Review Flowsheet Data    Depression screen [facility] 2/9 05/20/2019 02/02/2019 11/24/2018 11/04/2018 08/24/2018   Decreased Interest 1 0 0 0 0   Down, Depressed, Hopeless 2 0 0 0 0   PHQ - 2 Score 3 0 0 0 0   Altered sleeping 2 0 - - -   Tired, decreased energy 3 0 - - -   Change in appetite 2 0 - - -   Feeling bad or failure about yourself  2 0 - - -   Trouble concentrating 2 0 - - -   Moving slowly or fidgety/restless 2 0 - - -   Suicidal thoughts 0 0 - - -   PHQ-9 Score 16 0 - - -   Difficult doing work/chores Somewhat difficult - - - -     Interpretation of Total Score  Total Score Depression Severity:  1-4 = Minimal depression, 5-9 = Mild depression, 10-14 = Moderate depression, 15-19 = Moderately severe depression, 20-27 = Severe depression   Psychosocial Evaluation and Intervention: Psychosocial Evaluation - 05/19/19 0922      Psychosocial Evaluation & Interventions   Interventions  Encouraged to exercise with the program and follow exercise prescription;Stress management education    Comments  Mr. Friedel has bene referred for Cardiac Rehab this time as his EF has decreased.  [name] currently on oxygen at home and using a walker to get around.  He does not have portable oxygen at this time, but we will use it in class.  [name] not sure if  he is able to come with being hooked up with everything. He was encouraged that this  was the very reason that he needed to attend the program.  His goals are to be able to get back and moving again like he was last year.  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  He has a strong support system in his wife and kids.  His wife manages his medications and appointments.    Expected Outcomes  Short: Attend rehab regularly to build stamina and improve breating. Long:Improve stamina.    Continue Psychosocial Services   Follow up required by staff       Psychosocial Re-Evaluation:   Psychosocial Discharge (Final Psychosocial Re-Evaluation):   Vocational Rehabilitation: Provide vocational rehab assistance to qualifying candidates.   Vocational Rehab Evaluation & Intervention: Vocational Rehab - 05/19/19 0913      Initial Vocational Rehab Evaluation & Intervention   Assessment shows need for Vocational Rehabilitation  No   retired      Education: Education Goals: Education classes will be provided on a variety of topics geared toward better understanding of heart health and risk factor modification. Participant will state understanding/return demonstration of topics presented as noted by education test scores.  Learning Barriers/Preferences: Learning Barriers/Preferences - 05/19/19 0914      Learning Barriers/Preferences   Learning Barriers  Sight;Hearing   wears glasses, HOH   Learning Preferences  None       Education Topics:  AED/CPR: - Group verbal and written instruction with the use of models to demonstrate the basic use of the AED with the basic ABC's of resuscitation.   General Nutrition Guidelines/Fats and Fiber: -Group instruction provided by verbal, written material, models and posters to present the general guidelines for heart healthy nutrition. Gives an explanation and review of dietary fats and fiber.   Controlling Sodium/Reading Food Labels: -Group verbal and written material supporting the discussion of sodium use in heart healthy  nutrition. Review and explanation with models, verbal and written materials for utilization of the food label.   Exercise Physiology & General Exercise Guidelines: - Group verbal and written instruction with models to review the exercise physiology of the cardiovascular system and associated critical values. Provides general exercise guidelines with specific guidelines to those with heart or lung disease.    Aerobic Exercise & Resistance Training: - Gives group verbal and written instruction on the various components of exercise. Focuses on aerobic and resistive training programs and the benefits of this training and how to safely progress through these programs..   Flexibility, Balance, Mind/Body Relaxation: Provides group verbal/written instruction on the benefits of flexibility and balance training, including mind/body exercise modes such as yoga, pilates and tai chi.  Demonstration and skill practice provided.   Stress and Anxiety: - Provides group verbal and written instruction about the health risks of elevated stress and causes of high stress.  Discuss the correlation between heart/lung disease and anxiety and treatment options. Review healthy ways to manage with stress and anxiety.   Depression: - Provides group verbal and written instruction on the correlation between heart/lung disease and depressed mood, treatment options, and the stigmas associated with seeking treatment.   Anatomy & Physiology of the Heart: - Group verbal and written instruction and models provide basic cardiac anatomy and physiology, with the coronary electrical and arterial systems. Review of Valvular disease and Heart Failure   Cardiac Procedures: - Group verbal and written instruction to review commonly prescribed medications for heart disease.  Reviews the medication, class of the drug, and side effects. Includes the steps to properly store meds and maintain the prescription regimen. (beta blockers and  nitrates)   Cardiac Medications I: - Group verbal and written instruction to review commonly prescribed medications for heart disease. Reviews the medication, class of the drug, and side effects. Includes the steps to properly store meds and maintain the prescription regimen.   Cardiac Medications II: -Group verbal and written instruction to review commonly prescribed medications for heart disease. Reviews the medication, class of the drug, and side effects. (all other drug classes)    Go Sex-Intimacy & Heart Disease, Get SMART - Goal Setting: - Group verbal and written instruction through game format to discuss heart disease and the return to sexual intimacy. Provides group verbal and written material to discuss and apply goal setting through the application of the S.M.A.R.T. Method.   Other Matters of the Heart: - Provides group verbal, written materials and models to describe Stable Angina and Peripheral Artery. Includes description of the disease process and treatment options available to the cardiac patient.   Exercise & Equipment Safety: - Individual verbal instruction and demonstration of equipment use and safety with use of the equipment.   Cardiac Rehab from 05/20/2019 in [facility] Cardiac and Pulmonary Rehab  Date  05/20/19  Educator  AS  Instruction Review Code  1- Verbalizes Understanding      Infection Prevention: - Provides verbal and written material to individual with discussion of infection control including proper hand washing and proper equipment cleaning during exercise session.   Cardiac Rehab from 05/20/2019 in [facility] Cardiac and Pulmonary Rehab  Date  05/20/19  Educator  AS  Instruction Review Code  1- Verbalizes Understanding      Falls Prevention: - Provides verbal and written material to individual with discussion of falls prevention and safety.   Cardiac Rehab from 05/20/2019 in [facility] Cardiac and Pulmonary Rehab  Date  05/20/19  Educator  AS  Instruction Review  Code  1- Verbalizes Understanding      Diabetes: - Individual verbal and written instruction to review signs/symptoms of diabetes, desired ranges of glucose level fasting, after meals and with exercise. Acknowledge that pre and post exercise glucose checks will be done for 3 sessions at entry of program.   Know Your Numbers and Risk Factors: -Group verbal and written instruction about important numbers in your health.  Discussion of what are risk factors and how they play a role in the disease process.  Review of Cholesterol, Blood Pressure, Diabetes, and BMI and the role they play in your overall health.   Sleep Hygiene: -Provides group verbal and written instruction about how sleep can affect your health.  Define sleep hygiene, discuss sleep cycles and impact of sleep habits. Review good sleep hygiene tips.    Other: -Provides group and verbal instruction on various topics (see comments)   Knowledge Questionnaire Score: Knowledge Questionnaire Score - 05/20/19 1546      Knowledge Questionnaire Score   Pre Score  22/26       Core Components/Risk Factors/Patient Goals at Admission: Personal Goals and Risk Factors at Admission - 05/20/19 1547      Core Components/Risk Factors/Patient Goals on Admission    Weight Management  Yes;Weight Loss;Obesity    Intervention  Weight Management: Develop a combined nutrition and exercise program designed to reach desired caloric intake, while maintaining appropriate intake of nutrient and fiber, sodium and fats, and appropriate energy expenditure required for the weight  goal.;Weight Management: Provide education and appropriate resources to help participant work on and attain dietary goals.;Weight Management/Obesity: Establish reasonable short term and long term weight goals.;Obesity: Provide education and appropriate resources to help participant work on and attain dietary goals.    Admit Weight  206 lb (93.4 kg)    Goal Weight: Short Term  196  lb (88.9 kg)    Goal Weight: Long Term  186 lb (84.4 kg)    Expected Outcomes  Short Term: Continue to assess and modify interventions until short term weight is achieved;Long Term: Adherence to nutrition and physical activity/exercise program aimed toward attainment of established weight goal;Weight Loss: Understanding of general recommendations for a balanced deficit meal plan, which promotes 1-2 lb weight loss per week and includes a negative energy balance of [phone] kcal/d;Understanding recommendations for meals to include 15-35% energy as protein, 25-35% energy from fat, 35-60% energy from carbohydrates, less than 265m of dietary cholesterol, 20-35 gm of total fiber daily;Understanding of distribution of calorie intake throughout the day with the consumption of 4-5 meals/snacks       Core Components/Risk Factors/Patient Goals Review:    Core Components/Risk Factors/Patient Goals at Discharge (Final Review):    ITP Comments: ITP Comments    Row Name 05/19/19 0936 05/24/19 0936 06/02/19 1353       ITP Comments  Completed virtual orientation today.  EP evaluation is scheduled for Thursday 3/4 at 2pm.  [facility]  Pt had oriented on 02/26/19 but never came in for his walk test for various reasons.  Now he is coming in for Cardiac Rehab.  Completed Initial RD Eval  First full day of exercise!  Patient was oriented to gym and equipment including functions, settings, policies, and procedures.  Patient's individual exercise prescription and treatment plan were reviewed.  All starting workloads were established based on the results of the 6 minute walk test done at initial orientation visit.  The plan for exercise progression was also introduced and progression will be customized based on patient's performance and goals.        Comments:

## 2019-06-10 ENCOUNTER — Encounter: Payer: Medicare HMO | Admitting: *Deleted

## 2019-06-10 ENCOUNTER — Ambulatory Visit: Payer: Self-pay | Admitting: General Practice

## 2019-06-10 ENCOUNTER — Other Ambulatory Visit: Payer: Self-pay

## 2019-06-10 ENCOUNTER — Telehealth: Payer: Medicare HMO | Admitting: General Practice

## 2019-06-10 DIAGNOSIS — N400 Enlarged prostate without lower urinary tract symptoms: Secondary | ICD-10-CM | POA: Diagnosis not present

## 2019-06-10 DIAGNOSIS — E1121 Type 2 diabetes mellitus with diabetic nephropathy: Secondary | ICD-10-CM

## 2019-06-10 DIAGNOSIS — E1169 Type 2 diabetes mellitus with other specified complication: Secondary | ICD-10-CM

## 2019-06-10 DIAGNOSIS — E785 Hyperlipidemia, unspecified: Secondary | ICD-10-CM | POA: Diagnosis not present

## 2019-06-10 DIAGNOSIS — I5042 Chronic combined systolic (congestive) and diastolic (congestive) heart failure: Secondary | ICD-10-CM | POA: Diagnosis not present

## 2019-06-10 DIAGNOSIS — Z794 Long term (current) use of insulin: Secondary | ICD-10-CM

## 2019-06-10 DIAGNOSIS — Z87891 Personal history of nicotine dependence: Secondary | ICD-10-CM | POA: Diagnosis not present

## 2019-06-10 DIAGNOSIS — I110 Hypertensive heart disease with heart failure: Secondary | ICD-10-CM | POA: Diagnosis not present

## 2019-06-10 DIAGNOSIS — I5032 Chronic diastolic (congestive) heart failure: Secondary | ICD-10-CM

## 2019-06-10 DIAGNOSIS — E1142 Type 2 diabetes mellitus with diabetic polyneuropathy: Secondary | ICD-10-CM

## 2019-06-10 DIAGNOSIS — J449 Chronic obstructive pulmonary disease, unspecified: Secondary | ICD-10-CM

## 2019-06-10 DIAGNOSIS — Z7982 Long term (current) use of aspirin: Secondary | ICD-10-CM | POA: Diagnosis not present

## 2019-06-10 DIAGNOSIS — I10 Essential (primary) hypertension: Secondary | ICD-10-CM

## 2019-06-10 DIAGNOSIS — Z7901 Long term (current) use of anticoagulants: Secondary | ICD-10-CM | POA: Diagnosis not present

## 2019-06-10 DIAGNOSIS — Z79899 Other long term (current) drug therapy: Secondary | ICD-10-CM | POA: Diagnosis not present

## 2019-06-10 DIAGNOSIS — E119 Type 2 diabetes mellitus without complications: Secondary | ICD-10-CM | POA: Diagnosis not present

## 2019-06-10 NOTE — Chronic Care Management (AMB) (Signed)
Chronic Care Management   Follow Up Note   06/10/2019 Name: Lucas Caldwell MRN: [id number] DOB: June 14, 1933  Referred by: [name], DO Reason for referral : Chronic Care Management (Follow up: Chronic Disease Management and care coordination needs )   WYETH HOFFER is a 86 y.o. year old male who is a primary care patient of [name], DO. The CCM team was consulted for assistance with chronic disease management and care coordination needs.    Review of patient status, including review of consultants reports, relevant laboratory and other test results, and collaboration with appropriate care team members and the patient's provider was performed as part of comprehensive patient evaluation and provision of chronic care management services.    SDOH (Social Determinants of Health) assessments performed: Yes See Care Plan activities for detailed interventions related to SDOH)  SDOH Interventions     Most Recent Value  SDOH Interventions  SDOH Interventions for the Following Domains  Physical Activity  Physical Activity Interventions  Other (Comments) [the patient is limited in ability to do activity or excercise due to shob and decreased mobility]       Outpatient Encounter Medications as of 06/10/2019  Medication Sig Note   ACCU-CHEK AVIVA PLUS test strip Use to check blood sugar up to 2 times daily    Accu-Chek Softclix Lancets lancets Use to check blood sugar up to 2 times daily.    acetaminophen (TYLENOL) 500 MG tablet Take 1,000 mg by mouth every 6 (six) hours as needed for moderate pain or headache.     Alcohol Swabs 70 % PADS Use when checking blood sugar up to 3x daily    allopurinol (ZYLOPRIM) 100 MG tablet TAKE ONE TABLET BY MOUTH ONCE DAILY    Blood Glucose Monitoring Suppl (ACCU-CHEK AVIVA PLUS) w/Device KIT Use to check blood sugar up to 2 times daily    bosutinib (BOSULIF) 100 MG tablet Take 100 mg by mouth at bedtime as needed  (tired/not feeling well). Take with food.  08/31/2018: Pt's wife states he only takes this as needed when he's "not feeling well"   Dulaglutide (TRULICITY) 8.87 NZ/9.7KQ SOPN Inject 0.75 mg into the skin once a week. (Patient taking differently: Inject 0.75 mg into the skin every Saturday. )    empagliflozin (JARDIANCE) 10 MG TABS tablet Take 10 mg by mouth daily before breakfast.    gabapentin (NEURONTIN) 300 MG capsule Take 2 capsules (600 mg total) by mouth 2 (two) times daily. Max dose = 1232m daily for kidney function    hydrALAZINE (APRESOLINE) 50 MG tablet Take 1 tablet (50 mg total) by mouth 3 (three) times daily.    Insulin Glargine ([name] KWIKPEN) 100 UNIT/ML SOPN Inject 38 Units into the skin daily.     Insulin Pen Needle 31G X 5 MM MISC 31 g by Does not apply route as directed.    isosorbide mononitrate (IMDUR) 60 MG 24 hr tablet TAKE 1 AND 1/2 TABLETS BY MOUTH ONCE DAILY    levothyroxine (SYNTHROID) 25 MCG tablet TAKE 1 TABLET (25 MCG TOTAL) BY MOUTH DAILY BEFORE BREAKFAST.    loperamide (IMODIUM A-D) 2 MG tablet Take 2 mg by mouth 3 (three) times daily as needed for diarrhea or loose stools.     Menthol-Zinc Oxide (GOLD BOND EX) Apply 1 application topically daily as needed (muscle pain).    metoprolol succinate (TOPROL-XL) 25 MG 24 hr tablet Take 1 tablet (25 mg total) by mouth daily.    Multiple Vitamins-Minerals (  MULTIVITAMIN ADULTS 50+ PO) Take 1 tablet by mouth every morning.    nitroGLYCERIN (NITROSTAT) 0.4 MG SL tablet Place 1 tablet (0.4 mg total) under the tongue every 5 (five) minutes x 3 doses as needed for chest pain.    omeprazole (PRILOSEC) 20 MG capsule Take 20 mg by mouth 2 (two) times daily.    Polyethyl Glycol-Propyl Glycol (SYSTANE OP) Place 1 drop into both eyes daily as needed (dry eyes).    polyethylene glycol (MIRALAX / GLYCOLAX) 17 g packet Take 17 g by mouth daily as needed for mild constipation.     potassium chloride SA (KLOR-CON) 20 MEQ  tablet Take 1 tablet (20 mEq total) by mouth daily. Take 3 tablets the first day only.    PROVENTIL HFA 108 (90 Base) MCG/ACT inhaler Inhale 2 puffs into the lungs every 6 (six) hours as needed for wheezing or shortness of breath.    ranolazine (RANEXA) 500 MG 12 hr tablet TAKE ONE TABLET BY MOUTH TWICE DAILY    rosuvastatin (CRESTOR) 5 MG tablet Take 1 tablet (5 mg total) by mouth daily.    ticagrelor (BRILINTA) 90 MG TABS tablet Take 1 tablet (90 mg total) by mouth 2 (two) times daily.    torsemide (DEMADEX) 20 MG tablet Take 3 tablets (60 mg total) by mouth 2 (two) times daily.    traMADol (ULTRAM) 50 MG tablet Take 1-2 tablets (50-100 mg total) by mouth at bedtime as needed.    warfarin (COUMADIN) 3 MG tablet Take 1 tablet (3 mg total) by mouth as directed.    No facility-administered encounter medications on file as of 06/10/2019.     Objective:   Goals Addressed            This Visit's Progress    RN- I am getting more short winded but I feel better now (pt-stated)       Current Barriers:   Knowledge deficit related to basic heart failure pathophysiology and self care management   Case Manager Clinical Goal(s):   Over the next 120 days, patient will verbalize understanding of Heart Failure Action Plan and when to call doctor  Over the next 120 days, patient will take all Heart Failure mediations as prescribed  Over the next 120 days, patient will weigh daily and record (notifying MD of 3 lb weight gain over night or 5 lb in a week)  Over the next 120 days, patient will take blood pressure daily and record, notifying the provider for blood pressure outside of set parameters  Interventions:   Basic overview and discussion of pathophysiology of Heart Failure reviewed   Provided verbal education on low sodium diet, per the patient wife he is doing better with dietary restrictions. The patients wife continues to monitor and help patient   Discussed importance of  daily weight and advised patient to weigh and record daily: patient is now weighing daily and recording, weight this am was 196 (-5 pound weight loss since ER visit last week). No new ER visits related to shortness of breath  Reviewed role of diuretics in prevention of fluid overload and management of heart failure.  His diuretic regimen is working well for him at this time.  Discussed s/s of HF and increased fluid. Patient verbalized that he is feeling much better and went today to have a "test".  (ECHO- 04/22/2019 with an EF% of 30-35%)  Patient has been followed closely by cardiology and recently had studies by pulmonology to determine underlying cause of  increased Shortness of breath.   Patient reports using 02 at night successfully. The patient expressed he usually wakes up in the middle of the night and the oxygen has dried his nose out. He usually takes it off then and goes back to sleep without incident.  The patient does use oxygen during the day if he needs it. No acute distress noted.  Discussed the importance of [place] and the patient states PT and a nurse are coming into the home to check on him regularly. The patient feels he is improving.  ejection fraction: 30-35% 04-22-2019   BP Readings from Last 3 Encounters:  06/08/19 131/63  05/26/19 118/72  05/14/19 114/74    Wt Readings from Last 3 Encounters:  06/08/19 204 lb 9.6 oz (92.8 kg)  05/26/19 201 lb (91.2 kg)  05/20/19 206 lb (93.4 kg)      Wt Readings from Last 3 Encounters:  04/22/19 196 lb (88.9 kg)  04/19/19 201 lb 8 oz (91.4 kg)  04/15/19 201 lb 11.2 oz (91.5 kg)    BP Readings from Last 3 Encounters:  04/21/19 (!) 150/82  04/19/19 (!) 160/97  04/15/19 117/74       Patient Self Care Activities:   Takes Heart Failure Medications as prescribed  Weighs daily and record (notifying MD of 3 lb weight gain over night or 5 lb in a week)  Verbalizes understanding of and follows CHF Action Plan  Adheres to low sodium  diet  Please see past updates related to this goal by clicking on the "Past Updates" button in the selected goal       RNCM: We doing better at watching his blood pressure and sugars       Current Barriers:   Chronic Disease Management support, education, and care coordination needs related to HTN, COPD, and DMII  Clinical Goal(s) related to  HTN, COPD, and DMII:  Over the next 120 days, patient will:   Work with the care management team to address educational, disease management, and care coordination needs   Begin or continue self health monitoring activities as directed today Measure and record cbg (blood glucose) 1 times daily, Measure and record blood pressure 5 times per week, Measure and record weight daily, and adhere to a heart healthy/ADA diet  Call provider office for new or worsened signs and symptoms Blood glucose findings outside established parameters, Blood pressure findings outside established parameters, Weight outside established parameters, Oxygen saturation lower than established parameter, Shortness of breath, and New or worsened symptom related to Chronic health conditions  Call care management team with questions or concerns  Verbalize basic understanding of patient centered plan of care established today  Interventions related to  HTN, COPD, and DMII:  Evaluation of current treatment plans and patient's adherence to plan as established by provider.  Recent pcp visit due to pain in feet due to neuropathy. Tramadol added to nighttime medication regimen. The patient verbalized this helped his pain in his feet last night. The patient wife is also getting paperwork together for diabetic shoes.  She will let the RNCM know if she needs assistance   Assessed patient understanding of disease states- the patient verbalized he does well and his wife keeps up with all the things he needs.  Assessed patient's education and care coordination needs.  Ongoing support and  education on maintaining a diet low in sodium and sugars.   Provided disease specific education to patient   Collaborated with appropriate clinical care team members regarding  patient needs: CCM pharmacist available and working with the patient also. Will update on the patients verbal relief of foot pain with the addition of Tramadol.  Patient Self Care Activities related to CHF, HTN, DMII, and increased shortness of breath with recent hospitalization and ER visit:   Patient is unable to independently self-manage chronic health conditions  Please see past updates related to this goal by clicking on the "Past Updates" button in the selected goal       COMPLETED: RNCM: We need to know where to get the COVID vaccine       Current Barriers:   Knowledge Deficits related to COVID-19 and impact on patient self health management  Clinical Goal(s):   Over the next 30 days, patient will verbalize basic understanding of COVID-19 impact on individual health and self health management as evidenced by verbalization of basic understanding of COVID-19 as a viral disease, measures to prevent exposure, signs and symptoms, when to contact provider  Interventions:  Advised patient to call the number:  [phone] if they have not heard from scheduling to inquire about status of the vaccine and when they can anticipate getting the vaccine  Provided patient and/or caregiver with COVID19  information about where in [facility] the Vaccine can be obtained  ([employer]).  Assisted by placing the patient on the [email or web address] for call when COVID 19 Vaccine is available   Reviewed scheduled/upcoming provider appointments including: Next appointment with pcp on 06/04/2019  Patient Self Care Activities:   Patient verbalizes understanding of plan to receive COVID19 vaccine in the next 30-60 days by getting a phone call when an appointment is available   Attends all scheduled provider  appointments  Calls provider office for new concerns or questions  Initial goal documentation  For information about COVID-19 or "Corona Virus", the following web resources may be helpful:  CDC: BeginnerSteps.be    [place]:  InsuranceIntern.[email or web address]                        Plan:   The care management team will reach out to the patient again over the next 60 days.    Noreene Larsson RN, MSN, [place] Mobile: [phone]

## 2019-06-10 NOTE — Progress Notes (Signed)
Daily Session Note  Patient Details  Name: GHALI MORISSETTE MRN: [id number] Date of Birth: 02/20/1934 Referring Provider:     Cardiac Rehab from 05/20/2019 in [facility] Cardiac and Pulmonary Rehab  Referring Provider  Bensimhon      Encounter Date: 06/10/2019  Check In: Session Check In - 06/10/19 1339      Check-In   Staff Present  Heath Lark, RN, BSN, CCRP;Meredith Sherryll Burger, RN BSN;Jessica [place], MA, RCEP, CCRP, CCET;[employer] Visit  No    Medication changes reported      No    Fall or balance concerns reported     No    Warm-up and Cool-down  Performed on first and last piece of equipment    Resistance Training Performed  Yes    VAD Patient?  No    PAD/SET Patient?  No      Pain Assessment   Currently in Pain?  No/denies          Social History   Tobacco Use  Smoking Status Former Smoker  . Packs/day: 2.00  . Years: 15.00  . Pack years: 30.00  . Types: Cigarettes  . Quit date: 86  . Years since quitting: 42.2  Smokeless Tobacco Former Systems developer  Tobacco Comment   quit 1979    Goals Met:  Independence with exercise equipment Exercise tolerated well No report of cardiac concerns or symptoms  Goals Unmet:  Not Applicable  Comments: Pt able to follow exercise prescription today without complaint.  Will continue to monitor for progression.    Dr. Emily Filbert is Medical Director for [place] [name].

## 2019-06-10 NOTE — Patient Instructions (Signed)
Visit Information  Goals Addressed            This Visit's Progress   . RN- I am getting more short winded but I feel better now (pt-stated)       Current Barriers:  [name] Knowledge deficit related to basic heart failure pathophysiology and self care management   Case Manager Clinical Goal(s):  [name] Over the next 120 days, patient will verbalize understanding of Heart Failure Action Plan and when to call doctor . Over the next 120 days, patient will take all Heart Failure mediations as prescribed . Over the next 120 days, patient will weigh daily and record (notifying MD of 3 lb weight gain over night or 5 lb in a week) . Over the next 120 days, patient will take blood pressure daily and record, notifying the provider for blood pressure outside of set parameters  Interventions:  . Basic overview and discussion of pathophysiology of Heart Failure reviewed  . Provided verbal education on low sodium diet, per the patient wife he is doing better with dietary restrictions. The patients wife continues to monitor and help patient  . Discussed importance of daily weight and advised patient to weigh and record daily: patient is now weighing daily and recording, weight this am was 196 (-5 pound weight loss since ER visit last week). No new ER visits related to shortness of breath . Reviewed role of diuretics in prevention of fluid overload and management of heart failure.  His diuretic regimen is working well for him at this time. . Discussed s/s of HF and increased fluid. Patient verbalized that he is feeling much better and went today to have a "test".  (ECHO- 04/22/2019 with an EF% of 30-35%) . Patient has been followed closely by cardiology and recently had studies by pulmonology to determine underlying cause of increased Shortness of breath.  . Patient reports using 02 at night successfully. The patient expressed he usually wakes up in the middle of the night and the oxygen has dried his nose out. He  usually takes it off then and goes back to sleep without incident.  The patient does use oxygen during the day if he needs it. No acute distress noted. . Discussed the importance of [place] and the patient states PT and a nurse are coming into the home to check on him regularly. The patient feels he is improving. [name] ejection fraction: 30-35% 04-22-2019   BP Readings from Last 3 Encounters:  06/08/19 131/63  05/26/19 118/72  05/14/19 114/74    Wt Readings from Last 3 Encounters:  06/08/19 204 lb 9.6 oz (92.8 kg)  05/26/19 201 lb (91.2 kg)  05/20/19 206 lb (93.4 kg)      Wt Readings from Last 3 Encounters:  04/22/19 196 lb (88.9 kg)  04/19/19 201 lb 8 oz (91.4 kg)  04/15/19 201 lb 11.2 oz (91.5 kg)    BP Readings from Last 3 Encounters:  04/21/19 (!) 150/82  04/19/19 (!) 160/97  04/15/19 117/74       Patient Self Care Activities:  . Takes Heart Failure Medications as prescribed . Weighs daily and record (notifying MD of 3 lb weight gain over night or 5 lb in a week) . Verbalizes understanding of and follows CHF Action Plan . Adheres to low sodium diet  Please see past updates related to this goal by clicking on the "Past Updates" button in the selected goal      . RNCM: We doing better at watching his  blood pressure and sugars       Current Barriers:  . Chronic Disease Management support, education, and care coordination needs related to HTN, COPD, and DMII  Clinical Goal(s) related to  HTN, COPD, and DMII:  Over the next 120 days, patient will:  . Work with the care management team to address educational, disease management, and care coordination needs  . Begin or continue self health monitoring activities as directed today Measure and record cbg (blood glucose) 1 times daily, Measure and record blood pressure 5 times per week, Measure and record weight daily, and adhere to a heart healthy/ADA diet . Call provider office for new or worsened signs and symptoms Blood glucose findings  outside established parameters, Blood pressure findings outside established parameters, Weight outside established parameters, Oxygen saturation lower than established parameter, Shortness of breath, and New or worsened symptom related to Chronic health conditions . Call care management team with questions or concerns . Verbalize basic understanding of patient centered plan of care established today  Interventions related to  HTN, COPD, and DMII: . Evaluation of current treatment plans and patient's adherence to plan as established by provider.  Recent pcp visit due to pain in feet due to neuropathy. Tramadol added to nighttime medication regimen. The patient verbalized this helped his pain in his feet last night. The patient wife is also getting paperwork together for diabetic shoes.  She will let the [facility] know if she needs assistance  . Assessed patient understanding of disease states- the patient verbalized he does well and his wife keeps up with all the things he needs. . Assessed patient's education and care coordination needs.  Ongoing support and education on maintaining a diet low in sodium and sugars.  . Provided disease specific education to patient  . Collaborated with appropriate clinical care team members regarding patient needs: CCM pharmacist available and working with the patient also. Will update on the patients verbal relief of foot pain with the addition of Tramadol.  Patient Self Care Activities related to CHF, HTN, DMII, and increased shortness of breath with recent hospitalization and ER visit:  . Patient is unable to independently self-manage chronic health conditions  Please see past updates related to this goal by clicking on the "Past Updates" button in the selected goal      . COMPLETED: RNCM: We need to know where to get the COVID vaccine       Current Barriers:  [name] Knowledge Deficits related to COVID-19 and impact on patient self health management  Clinical Goal(s):   [name] Over the next 30 days, patient will verbalize basic understanding of COVID-19 impact on individual health and self health management as evidenced by verbalization of basic understanding of COVID-19 as a viral disease, measures to prevent exposure, signs and symptoms, when to contact provider  Interventions: . Advised patient to call the number:  [phone] if they have not heard from scheduling to inquire about status of the vaccine and when they can anticipate getting the vaccine . Provided patient and/or caregiver with COVID19  information about where in [facility] the Vaccine can be obtained  Advice worker). . Assisted by placing the patient on the [employer]Fun[employer].com.br for call when COVID 19 Vaccine is available  . Reviewed scheduled/upcoming provider appointments including: Next appointment with pcp on 06/04/2019  Patient Self Care Activities:  . Patient verbalizes understanding of plan to receive COVID19 vaccine in the next 30-60 days by getting a phone call when an appointment is available  .  Attends all scheduled provider appointments . Calls provider office for new concerns or questions  Initial goal documentation  For information about COVID-19 or "Corona Virus", the following web resources may be helpful:  CDC: BeginnerSteps.be    [place]:  InsuranceIntern.se                       Patient verbalizes understanding of instructions provided today.   The care management team will reach out to the patient again over the next 60 days.   Noreene Larsson RN, MSN, [place] Mobile: [phone]

## 2019-06-14 ENCOUNTER — Other Ambulatory Visit: Payer: Self-pay

## 2019-06-14 ENCOUNTER — Encounter: Payer: Medicare HMO | Admitting: *Deleted

## 2019-06-14 DIAGNOSIS — Z87891 Personal history of nicotine dependence: Secondary | ICD-10-CM | POA: Diagnosis not present

## 2019-06-14 DIAGNOSIS — N400 Enlarged prostate without lower urinary tract symptoms: Secondary | ICD-10-CM | POA: Diagnosis not present

## 2019-06-14 DIAGNOSIS — I110 Hypertensive heart disease with heart failure: Secondary | ICD-10-CM | POA: Diagnosis not present

## 2019-06-14 DIAGNOSIS — E119 Type 2 diabetes mellitus without complications: Secondary | ICD-10-CM | POA: Diagnosis not present

## 2019-06-14 DIAGNOSIS — I5042 Chronic combined systolic (congestive) and diastolic (congestive) heart failure: Secondary | ICD-10-CM

## 2019-06-14 DIAGNOSIS — I5022 Chronic systolic (congestive) heart failure: Secondary | ICD-10-CM

## 2019-06-14 DIAGNOSIS — Z794 Long term (current) use of insulin: Secondary | ICD-10-CM | POA: Diagnosis not present

## 2019-06-14 DIAGNOSIS — Z79899 Other long term (current) drug therapy: Secondary | ICD-10-CM | POA: Diagnosis not present

## 2019-06-14 DIAGNOSIS — Z7901 Long term (current) use of anticoagulants: Secondary | ICD-10-CM | POA: Diagnosis not present

## 2019-06-14 DIAGNOSIS — Z7982 Long term (current) use of aspirin: Secondary | ICD-10-CM | POA: Diagnosis not present

## 2019-06-14 NOTE — Progress Notes (Signed)
Daily Session Note  Patient Details  Name: Lucas Caldwell MRN: [id number] Date of Birth: 01-Aug-1933 Referring Provider:     Cardiac Rehab from 05/20/2019 in [facility] Cardiac and Pulmonary Rehab  Referring Provider  Bensimhon      Encounter Date: 06/14/2019  Check In: Session Check In - 06/14/19 1350      Check-In   Supervising physician immediately available to respond to emergencies  See telemetry face sheet for immediately available ER MD    Location  [facility]-Cardiac & Pulmonary Rehab    Staff Present  Renita Papa, RN Moises Blood, BS, ACSM CEP, Exercise Physiologist;Melissa Caiola RDN, LDN;Jessica Luan Pulling, MA, RCEP, CCRP, CCET    Virtual Visit  No    Medication changes reported      No    Fall or balance concerns reported     No    Warm-up and Cool-down  Performed on first and last piece of equipment    Resistance Training Performed  Yes    VAD Patient?  No    PAD/SET Patient?  No      Pain Assessment   Currently in Pain?  No/denies          Social History   Tobacco Use  Smoking Status Former Smoker  . Packs/day: 2.00  . Years: 15.00  . Pack years: 30.00  . Types: Cigarettes  . Quit date: 47  . Years since quitting: 42.2  Smokeless Tobacco Former Systems developer  Tobacco Comment   quit 1979    Goals Met:  Independence with exercise equipment Exercise tolerated well No report of cardiac concerns or symptoms Strength training completed today  Goals Unmet:  Not Applicable  Comments: Pt able to follow exercise prescription today without complaint.  Will continue to monitor for progression.    Dr. Emily Filbert is Medical Director for [place] and [name].

## 2019-06-15 ENCOUNTER — Encounter (HOSPITAL_COMMUNITY): Payer: Medicare HMO

## 2019-06-16 ENCOUNTER — Encounter: Payer: Medicare HMO | Admitting: *Deleted

## 2019-06-16 ENCOUNTER — Other Ambulatory Visit: Payer: Self-pay

## 2019-06-16 DIAGNOSIS — Z7901 Long term (current) use of anticoagulants: Secondary | ICD-10-CM | POA: Diagnosis not present

## 2019-06-16 DIAGNOSIS — Z7982 Long term (current) use of aspirin: Secondary | ICD-10-CM | POA: Diagnosis not present

## 2019-06-16 DIAGNOSIS — N400 Enlarged prostate without lower urinary tract symptoms: Secondary | ICD-10-CM | POA: Diagnosis not present

## 2019-06-16 DIAGNOSIS — Z87891 Personal history of nicotine dependence: Secondary | ICD-10-CM | POA: Diagnosis not present

## 2019-06-16 DIAGNOSIS — I5042 Chronic combined systolic (congestive) and diastolic (congestive) heart failure: Secondary | ICD-10-CM

## 2019-06-16 DIAGNOSIS — E119 Type 2 diabetes mellitus without complications: Secondary | ICD-10-CM | POA: Diagnosis not present

## 2019-06-16 DIAGNOSIS — I110 Hypertensive heart disease with heart failure: Secondary | ICD-10-CM | POA: Diagnosis not present

## 2019-06-16 DIAGNOSIS — Z794 Long term (current) use of insulin: Secondary | ICD-10-CM | POA: Diagnosis not present

## 2019-06-16 DIAGNOSIS — Z79899 Other long term (current) drug therapy: Secondary | ICD-10-CM | POA: Diagnosis not present

## 2019-06-16 NOTE — Progress Notes (Signed)
Daily Session Note  Patient Details  Name: Lucas Caldwell MRN: [id number] Date of Birth: 11/21/1933 Referring Provider:     Cardiac Rehab from 05/20/2019 in [facility] and [facility] Re[facility]  Referring Provider  Bensimhon      Encounter Date: 06/16/2019  Check In: Session Check In - 06/16/19 1347      Check-In   Staff Present   Jo , RN, BSN, MA;Melissa Caiola RDN, LDN;Joseph Hood RCP,RRT,BSRT    Virtual Visit  No    Medication changes reported      No    Fall or balance concerns reported     No    Warm-up and Cool-down  Performed on first and last piece of equipment    Resistance Training Performed  Yes    VAD Patient?  No    PAD/SET Patient?  No      Pain Assessment   Currently in Pain?  No/denies          Social History   Tobacco Use  Smoking Status Former Smoker  . Packs/day: 2.00  . Years: 15.00  . Pack years: 30.00  . Types: Cigarettes  . Quit date: 1979  . Years since quitting: 42.2  Smokeless Tobacco Former User  Tobacco Comment   quit 1979    Goals Met:  Independence with exercise equipment Exercise tolerated well No report of cardiac concerns or symptoms  Goals Unmet:  Not Applicable  Comments: Pt able to follow exercise prescription today without complaint.  Will continue to monitor for progression.   Dr. Mark Miller is Medical Director for [facility] and [name]. 

## 2019-06-17 ENCOUNTER — Encounter: Payer: Medicare HMO | Attending: Internal Medicine | Admitting: *Deleted

## 2019-06-17 ENCOUNTER — Other Ambulatory Visit: Payer: Self-pay

## 2019-06-17 DIAGNOSIS — I480 Paroxysmal atrial fibrillation: Secondary | ICD-10-CM | POA: Insufficient documentation

## 2019-06-17 DIAGNOSIS — N400 Enlarged prostate without lower urinary tract symptoms: Secondary | ICD-10-CM | POA: Diagnosis not present

## 2019-06-17 DIAGNOSIS — E039 Hypothyroidism, unspecified: Secondary | ICD-10-CM | POA: Insufficient documentation

## 2019-06-17 DIAGNOSIS — Z79899 Other long term (current) drug therapy: Secondary | ICD-10-CM | POA: Diagnosis not present

## 2019-06-17 DIAGNOSIS — Z794 Long term (current) use of insulin: Secondary | ICD-10-CM | POA: Insufficient documentation

## 2019-06-17 DIAGNOSIS — E785 Hyperlipidemia, unspecified: Secondary | ICD-10-CM | POA: Insufficient documentation

## 2019-06-17 DIAGNOSIS — Z7901 Long term (current) use of anticoagulants: Secondary | ICD-10-CM | POA: Diagnosis not present

## 2019-06-17 DIAGNOSIS — I110 Hypertensive heart disease with heart failure: Secondary | ICD-10-CM | POA: Insufficient documentation

## 2019-06-17 DIAGNOSIS — Z7982 Long term (current) use of aspirin: Secondary | ICD-10-CM | POA: Insufficient documentation

## 2019-06-17 DIAGNOSIS — I5022 Chronic systolic (congestive) heart failure: Secondary | ICD-10-CM

## 2019-06-17 DIAGNOSIS — K219 Gastro-esophageal reflux disease without esophagitis: Secondary | ICD-10-CM | POA: Insufficient documentation

## 2019-06-17 DIAGNOSIS — E119 Type 2 diabetes mellitus without complications: Secondary | ICD-10-CM | POA: Diagnosis not present

## 2019-06-17 DIAGNOSIS — I5042 Chronic combined systolic (congestive) and diastolic (congestive) heart failure: Secondary | ICD-10-CM | POA: Diagnosis not present

## 2019-06-17 DIAGNOSIS — I5040 Unspecified combined systolic (congestive) and diastolic (congestive) heart failure: Secondary | ICD-10-CM | POA: Diagnosis present

## 2019-06-17 DIAGNOSIS — Z87891 Personal history of nicotine dependence: Secondary | ICD-10-CM | POA: Insufficient documentation

## 2019-06-17 NOTE — Progress Notes (Signed)
Daily Session Note  Patient Details  Name: Lucas Caldwell MRN: [id number] Date of Birth: [birth year] Referring Provider:     Cardiac Rehab from 05/20/2019 in [facility] Rehab  Referring Provider  Bensimhon      Encounter Date: 06/17/2019  Check In: Session Check In - 06/17/19 1326      Check-In   Supervising physician immediately available to respond to emergencies  See telemetry face sheet for immediately available ER MD    Location  [facility] Pul[facility] Re[facility]    Staff Present  Renita Papa, RN BSN;Joseph [address] [place], [place], [place], CCRP, CCET    Virtual Visit  No    Medication changes reported      No    Fall or balance concerns reported     No    Warm-up and Cool-down  Performed on first and last piece of equipment    Resistance Training Performed  Yes    VAD Patient?  No    PAD/SET Patient?  No      Pain Assessment   Currently in Pain?  No/denies          Social History   Tobacco Use  Smoking Status Former Smoker  . Packs/day: 2.00  . Years: 15.00  . Pack years: 30.00  . Types: Cigarettes  . Quit date: 62  . Years since quitting: 42.2  Smokeless Tobacco Former Systems developer  Tobacco Comment   quit 1979    Goals Met:  Independence with exercise equipment Exercise tolerated well No report of cardiac concerns or symptoms Strength training completed today  Goals Unmet:  Not Applicable  Comments: Pt able to follow exercise prescription today without complaint.  Will continue to monitor for progression.    Dr. Emily Filbert is Medical Director for [place] and [name].

## 2019-06-21 ENCOUNTER — Other Ambulatory Visit: Payer: Self-pay

## 2019-06-21 ENCOUNTER — Encounter: Payer: Medicare HMO | Admitting: *Deleted

## 2019-06-21 DIAGNOSIS — I5042 Chronic combined systolic (congestive) and diastolic (congestive) heart failure: Secondary | ICD-10-CM

## 2019-06-21 DIAGNOSIS — Z794 Long term (current) use of insulin: Secondary | ICD-10-CM | POA: Diagnosis not present

## 2019-06-21 DIAGNOSIS — Z7982 Long term (current) use of aspirin: Secondary | ICD-10-CM | POA: Diagnosis not present

## 2019-06-21 DIAGNOSIS — Z87891 Personal history of nicotine dependence: Secondary | ICD-10-CM | POA: Diagnosis not present

## 2019-06-21 DIAGNOSIS — N400 Enlarged prostate without lower urinary tract symptoms: Secondary | ICD-10-CM | POA: Diagnosis not present

## 2019-06-21 DIAGNOSIS — Z79899 Other long term (current) drug therapy: Secondary | ICD-10-CM | POA: Diagnosis not present

## 2019-06-21 DIAGNOSIS — E119 Type 2 diabetes mellitus without complications: Secondary | ICD-10-CM | POA: Diagnosis not present

## 2019-06-21 DIAGNOSIS — Z7901 Long term (current) use of anticoagulants: Secondary | ICD-10-CM | POA: Diagnosis not present

## 2019-06-21 DIAGNOSIS — I110 Hypertensive heart disease with heart failure: Secondary | ICD-10-CM | POA: Diagnosis not present

## 2019-06-21 NOTE — Progress Notes (Signed)
Daily Session Note  Patient Details  Name: Lucas Caldwell MRN: [id number] Date of Birth: Jul 20, 1933 Referring Provider:     Cardiac Rehab from 05/20/2019 in [facility] Cardiac and Pulmonary Rehab  Referring Provider  Bensimhon      Encounter Date: 06/21/2019  Check In: Session Check In - 06/21/19 1315      Check-In   Supervising physician immediately available to respond to emergencies  See telemetry face sheet for immediately available ER MD    Location  [facility]    Staff Present  Heath Lark, RN, BSN, [place]aveda Norman, BS, ACSM CEP, Exercise Physiologist;Joseph Hood RCP,RRT,BSRT;Melissa [place] RDN, LDN    Virtual Visit  No    Medication changes reported      No    Fall or balance concerns reported     No    Warm-up and Cool-down  Performed on first and last piece of equipment    Resistance Training Performed  Yes    VAD Patient?  No    PAD/SET Patient?  No      Pain Assessment   Currently in Pain?  No/denies          Social History   Tobacco Use  Smoking Status Former Smoker  . Packs/day: 2.00  . Years: 15.00  . Pack years: 30.00  . Types: Cigarettes  . Quit date: 1  . Years since quitting: 42.2  Smokeless Tobacco Former Systems developer  Tobacco Comment   quit 1979    Goals Met:  Independence with exercise equipment Exercise tolerated well No report of cardiac concerns or symptoms  Goals Unmet:  Not Applicable  Comments: Pt able to follow exercise prescription today without complaint.  Will continue to monitor for progression.    Dr. Emily Filbert is Medical Director for [place] and [name].

## 2019-06-23 ENCOUNTER — Ambulatory Visit: Payer: Medicare HMO | Admitting: Primary Care

## 2019-06-23 ENCOUNTER — Other Ambulatory Visit: Payer: Self-pay

## 2019-06-23 ENCOUNTER — Encounter: Payer: Medicare HMO | Admitting: *Deleted

## 2019-06-23 DIAGNOSIS — I5022 Chronic systolic (congestive) heart failure: Secondary | ICD-10-CM

## 2019-06-23 DIAGNOSIS — I110 Hypertensive heart disease with heart failure: Secondary | ICD-10-CM | POA: Diagnosis not present

## 2019-06-23 DIAGNOSIS — Z79899 Other long term (current) drug therapy: Secondary | ICD-10-CM | POA: Diagnosis not present

## 2019-06-23 DIAGNOSIS — N400 Enlarged prostate without lower urinary tract symptoms: Secondary | ICD-10-CM | POA: Diagnosis not present

## 2019-06-23 DIAGNOSIS — E119 Type 2 diabetes mellitus without complications: Secondary | ICD-10-CM | POA: Diagnosis not present

## 2019-06-23 DIAGNOSIS — I5042 Chronic combined systolic (congestive) and diastolic (congestive) heart failure: Secondary | ICD-10-CM | POA: Diagnosis not present

## 2019-06-23 DIAGNOSIS — Z87891 Personal history of nicotine dependence: Secondary | ICD-10-CM | POA: Diagnosis not present

## 2019-06-23 DIAGNOSIS — Z7982 Long term (current) use of aspirin: Secondary | ICD-10-CM | POA: Diagnosis not present

## 2019-06-23 DIAGNOSIS — Z794 Long term (current) use of insulin: Secondary | ICD-10-CM | POA: Diagnosis not present

## 2019-06-23 DIAGNOSIS — Z7901 Long term (current) use of anticoagulants: Secondary | ICD-10-CM | POA: Diagnosis not present

## 2019-06-23 NOTE — Progress Notes (Signed)
Daily Session Note  Patient Details  Name: Lucas Caldwell MRN: [id number] Date of Birth: Aug 28, 1933 Referring Provider:     Cardiac Rehab from 05/20/2019 in [facility] Cardiac and Pulmonary Rehab  Referring Provider  Bensimhon      Encounter Date: 06/23/2019  Check In: Session Check In - 06/23/19 1330      Check-In   Supervising physician immediately available to respond to emergencies  See telemetry face sheet for immediately available ER MD    Location  [facility]iac [facility] Pulmonary Re[facility]    Staff Present  Renita Papa, RN BSN;Joseph Hood RCP,RRT,BSRT;Melissa [place] RDN, Rowe Pavy, BA, ACSM CEP, Exercise Physiologist    Virtual Visit  No    Medication changes reported      No    Fall or balance concerns reported     No    Warm-up and Cool-down  Performed on first and last piece of equipment    Resistance Training Performed  Yes    VAD Patient?  No    PAD/SET Patient?  No      Pain Assessment   Currently in Pain?  No/denies          Social History   Tobacco Use  Smoking Status Former Smoker  . Packs/day: 2.00  . Years: 15.00  . Pack years: 30.00  . Types: Cigarettes  . Quit date: 67  . Years since quitting: 42.2  Smokeless Tobacco Former Systems developer  Tobacco Comment   quit 1979    Goals Met:  Independence with exercise equipment Exercise tolerated well No report of cardiac concerns or symptoms Strength training completed today  Goals Unmet:  Not Applicable  Comments: Pt able to follow exercise prescription today without complaint.  Will continue to monitor for progression.    Dr. Emily Filbert is Medical Director for [place] and [name].

## 2019-06-24 ENCOUNTER — Encounter: Payer: Medicare HMO | Admitting: *Deleted

## 2019-06-24 ENCOUNTER — Other Ambulatory Visit: Payer: Self-pay

## 2019-06-24 DIAGNOSIS — Z79899 Other long term (current) drug therapy: Secondary | ICD-10-CM | POA: Diagnosis not present

## 2019-06-24 DIAGNOSIS — Z87891 Personal history of nicotine dependence: Secondary | ICD-10-CM | POA: Diagnosis not present

## 2019-06-24 DIAGNOSIS — I110 Hypertensive heart disease with heart failure: Secondary | ICD-10-CM | POA: Diagnosis not present

## 2019-06-24 DIAGNOSIS — Z7901 Long term (current) use of anticoagulants: Secondary | ICD-10-CM | POA: Diagnosis not present

## 2019-06-24 DIAGNOSIS — I5042 Chronic combined systolic (congestive) and diastolic (congestive) heart failure: Secondary | ICD-10-CM | POA: Diagnosis not present

## 2019-06-24 DIAGNOSIS — Z7982 Long term (current) use of aspirin: Secondary | ICD-10-CM | POA: Diagnosis not present

## 2019-06-24 DIAGNOSIS — Z794 Long term (current) use of insulin: Secondary | ICD-10-CM | POA: Diagnosis not present

## 2019-06-24 DIAGNOSIS — N400 Enlarged prostate without lower urinary tract symptoms: Secondary | ICD-10-CM | POA: Diagnosis not present

## 2019-06-24 DIAGNOSIS — E119 Type 2 diabetes mellitus without complications: Secondary | ICD-10-CM | POA: Diagnosis not present

## 2019-06-24 NOTE — Progress Notes (Signed)
Daily Session Note  Patient Details  Name: Lucas Caldwell MRN: [id number] Date of Birth: 1933/07/31 Referring Provider:     Cardiac Rehab from 05/20/2019 in [facility] Cardiac and Pulmonary Rehab  Referring Provider  Bensimhon      Encounter Date: 06/24/2019  Check In:      Social History   Tobacco Use  Smoking Status Former Smoker  . Packs/day: 2.00  . Years: 15.00  . Pack years: 30.00  . Types: Cigarettes  . Quit date: 61  . Years since quitting: 42.2  Smokeless Tobacco Former Systems developer  Tobacco Comment   quit 1979    Goals Met:  Independence with exercise equipment Exercise tolerated well No report of cardiac concerns or symptoms  Goals Unmet:  Not Applicable  Comments: Pt able to follow exercise prescription today without complaint.  Will continue to monitor for progression.   Dr. Emily Filbert is Medical Director for [place] and [name].

## 2019-06-25 ENCOUNTER — Telehealth: Payer: Self-pay | Admitting: Family Medicine

## 2019-06-25 ENCOUNTER — Ambulatory Visit (INDEPENDENT_AMBULATORY_CARE_PROVIDER_SITE_OTHER): Payer: Medicare HMO | Admitting: Pharmacist

## 2019-06-25 ENCOUNTER — Telehealth: Payer: Self-pay | Admitting: Internal Medicine

## 2019-06-25 DIAGNOSIS — I25118 Atherosclerotic heart disease of native coronary artery with other forms of angina pectoris: Secondary | ICD-10-CM

## 2019-06-25 DIAGNOSIS — E1121 Type 2 diabetes mellitus with diabetic nephropathy: Secondary | ICD-10-CM

## 2019-06-25 NOTE — Chronic Care Management (AMB) (Signed)
Chronic Care Management   Follow Up Note   06/25/2019 Name: Lucas Caldwell MRN: [id number] DOB: 1933/05/14  Referred by: Lucas Hauser, DO Reason for referral : Chronic Care Management (Patient/caregiver Phone Call)   Lucas Caldwell is a 86 y.o. year old male who is a primary care patient of Lucas Hauser, DO. The CCM team was consulted for assistance with chronic disease management and care coordination needs.  Lucas Caldwell has a past medical history including but not limited to type 2 diabetes, CKD, hyperlipidemia, COPD, atrial fibrillation, hypertension, coronary artery disease, CHF, hypothyroidism, and CML  I reached out to Lucas Caldwell and his wife by phone today.   Coordination of care call to Lucas Caldwell.  Review of patient status, including review of consultants reports, relevant laboratory and other test results, and collaboration with appropriate care team members and the patient's provider was performed as part of comprehensive patient evaluation and provision of chronic care management services.      Outpatient Encounter Medications as of 06/25/2019  Medication Sig Note  . allopurinol (ZYLOPRIM) 100 MG tablet TAKE ONE TABLET BY MOUTH ONCE DAILY   . Dulaglutide (TRULICITY) 8.75 IE/3.3IR SOPN Inject 0.75 mg into the skin once a week. (Patient taking differently: Inject 0.75 mg into the skin every Saturday. )   . empagliflozin (Lucas Caldwell) 10 MG TABS tablet Take 10 mg by mouth daily before breakfast.   . Insulin Glargine ([name] KWIKPEN) 100 UNIT/ML SOPN Inject 38 Units into the skin daily.    [name] levothyroxine (SYNTHROID) 25 MCG tablet TAKE 1 TABLET (25 MCG TOTAL) BY MOUTH DAILY BEFORE BREAKFAST.   [name] omeprazole (PRILOSEC) 20 MG capsule Take 20 mg by mouth 2 (two) times daily.   . rosuvastatin (CRESTOR) 5 MG tablet Take 1 tablet (5 mg total) by mouth daily.   [name] warfarin (COUMADIN) 3 MG tablet Take 1 tablet (3 mg total) by mouth as directed.   [name]  ACCU-CHEK AVIVA PLUS test strip Use to check blood sugar up to 2 times daily   . Accu-Chek Softclix Lancets lancets Use to check blood sugar up to 2 times daily.   [name] acetaminophen (TYLENOL) 500 MG tablet Take 1,000 mg by mouth every 6 (six) hours as needed for moderate pain or headache.    . Alcohol Swabs 70 % PADS Use when checking blood sugar up to 3x daily   . Blood Glucose Monitoring Suppl (ACCU-CHEK AVIVA PLUS) w/Device KIT Use to check blood sugar up to 2 times daily   . bosutinib (BOSULIF) 100 MG tablet Take 100 mg by mouth at bedtime as needed (tired/not feeling well). Take with food.  08/31/2018: Pt's wife states he only takes this as needed when he's "not feeling well"  . gabapentin (NEURONTIN) 300 MG capsule Take 2 capsules (600 mg total) by mouth 2 (two) times daily. Max dose = 1259m daily for kidney function   . hydrALAZINE (APRESOLINE) 50 MG tablet Take 1 tablet (50 mg total) by mouth 3 (three) times daily.   . Insulin Pen Needle 31G X 5 MM MISC 31 g by Does not apply route as directed.   . isosorbide mononitrate (IMDUR) 60 MG 24 hr tablet TAKE 1 AND 1/2 TABLETS BY MOUTH ONCE DAILY   . loperamide (IMODIUM A-D) 2 MG tablet Take 2 mg by mouth 3 (three) times daily as needed for diarrhea or loose stools.    . Menthol-Zinc Oxide (GOLD BOND EX) Apply 1 application topically daily as needed (muscle  pain).   . metoprolol succinate (TOPROL-XL) 25 MG 24 hr tablet Take 1 tablet (25 mg total) by mouth daily.   . Multiple Vitamins-Minerals (MULTIVITAMIN ADULTS 50+ PO) Take 1 tablet by mouth every morning.   . nitroGLYCERIN (NITROSTAT) 0.4 MG SL tablet Place 1 tablet (0.4 mg total) under the tongue every 5 (five) minutes x 3 doses as needed for chest pain.   Lucas Caldwell Glycol-Propyl Glycol (SYSTANE OP) Place 1 drop into both eyes daily as needed (dry eyes).   . polyethylene glycol (MIRALAX / GLYCOLAX) 17 g packet Take 17 g by mouth daily as needed for mild constipation.    . potassium chloride SA  (KLOR-CON) 20 MEQ tablet Take 1 tablet (20 mEq total) by mouth daily. Take 3 tablets the first day only.   [name] PROVENTIL HFA 108 (90 Base) MCG/ACT inhaler Inhale 2 puffs into the lungs every 6 (six) hours as needed for wheezing or shortness of breath.   . ranolazine (RANEXA) 500 MG 12 hr tablet TAKE ONE TABLET BY MOUTH TWICE DAILY   . ticagrelor (BRILINTA) 90 MG TABS tablet Take 1 tablet (90 mg total) by mouth 2 (two) times daily. (Patient not taking: Reported on 06/25/2019)   . torsemide (DEMADEX) 20 MG tablet Take 3 tablets (60 mg total) by mouth 2 (two) times daily.   . traMADol (ULTRAM) 50 MG tablet Take 1-2 tablets (50-100 mg total) by mouth at bedtime as needed.    No facility-administered encounter medications on file as of 06/25/2019.    Goals Addressed            This Visit's Progress   . PharmD - Medication Managment       CARE PLAN ENTRY (see longitudinal plan of care for additional care plan information)  Current Barriers:  . Financial o Note patient has Partial Extra Help/LIS Subsidy . Limited vision . Chronic Disease Management support and education needs related to multiple chronic disease processes  Pharmacist Clinical Goal(s):  [name] Over the next 30 days, patient will work with CM Pharmacist to address needs related to medication adherence and diabetes management  Interventions: . Previously collaborated with RPh with [facility] on 3/22 regarding possible switch of patient's [place] to [facility] for patient to be eligible for medication assistance, particularly as patient currently enrolled in [employer] patient assistance program (eligible through 03/17/20) . Follow up with patient and wife today regarding patient assistance. Confirms that patient received Lucas Caldwell from assistance program. . Follow up with wife/patient regarding blood sugar monitoring and control ? Reports patient currently taking: ? [name] 38 untis daily ? Trulicity 1.61 mg once  weekly ? Lucas Caldwell 10 mg once daily before breakfast ? Patient reports AM CBGs: ? Today: 133  ? Yesterday: 146 . Discuss options for reducing pill burden ? Discuss option of tapering down patient's omeprazole ? Reports patient's GERD symptoms currently controlled on omperazole 20 mg twice daily ? Discuss non-pharmacologic strategies for reducing acid symptoms ? Plan for patient to continue to take omeprazole 20 mg QAM and then evening dose only as needed with goal to decrease down to just omeprazole 20 mg once daily if tolerated. ? Patient/wife express interest in reducing/stopping allopurinol ? Currently taking allopurinol 100 mg once daily ? Patient/wife do not recall last time patient had a gout flare ? Note PCP has placed order for uric acid lab work at last visit ? Will plan to f/u regarding possible reduction of dose once lab result back . Mrs. Langan reports  patient's Brilinta stopped since we last spoke. Reports taking warfarin following direction from Coumadin clinic. Denies currently taking aspirin. ? From chart review, see that per 3/10 note from Dr. Saunders Revel, "His INR remains slightly subtherapeutic today.  We will discontinue aspirin and continue ticagrelor and warfarin until INR is therapeutic.  At that point, we will consider discontinuation of ticagrelor and proceeding with warfarin and aspirin in case ticagrelor is contributing to Mr. Vogan' shortness of breath." . Place coordination of care call to [facility] and send [employer] to [place], [place] with clinic. Request call back to patient/wife to confirm current regimen for CAD. [name] Schedule follow up telephone call to complete medication review . Will collaborate with PCP regarding timing of lab work  Patient Self Care Activities:  . Check blood sugar, blood pressure and daily weight as directed by providers  . Patient takes medications, with assistance of wife, as directed o Mrs. Brahmbhatt manages patient's  medications using weekly pillbox as adherence tool . Patient attends scheduled medical appointments  Please see past updates related to this goal by clicking on the "Past Updates" button in the selected goal         Plan  The care management team will reach out to the patient again over the next 14 days.   [name], PharmD, [place] [employer] [phone]

## 2019-06-25 NOTE — Patient Instructions (Signed)
Thank you allowing the Chronic Care Management Team to be a part of your care! It was a pleasure speaking with you today!     CCM (Chronic Care Management) Team    Noreene Larsson RN, MSN, CCM Nurse Care Coordinator  [phone]   [name] PharmD  Clinical Pharmacist  [phone]   Eula Fried LCSW Clinical Social Worker [phone]  Visit Information  Goals Addressed            This Visit's Progress   . PharmD - Medication Managment       CARE PLAN ENTRY (see longitudinal plan of care for additional care plan information)  Current Barriers:  . Financial o Note patient has Partial Extra Help/LIS Subsidy . Limited vision . Chronic Disease Management support and education needs related to multiple chronic disease processes  Pharmacist Clinical Goal(s):  [name] Over the next 30 days, patient will work with CM Pharmacist to address needs related to medication adherence and diabetes management  Interventions: . Previously collaborated with RPh with [facility] on 3/22 regarding possible switch of patient's [place] to [facility] for patient to be eligible for medication assistance, particularly as patient currently enrolled in [employer] patient assistance program (eligible through 03/17/20) . Follow up with patient and wife today regarding patient assistance. Confirms that patient received Jardiance from assistance program. . Follow up with wife/patient regarding blood sugar monitoring and control ? Reports patient currently taking: ? [name] 38 untis daily ? Trulicity 5.46 mg once weekly ? Jardiance 10 mg once daily before breakfast ? Patient reports AM CBGs: ? Today: 133  ? Yesterday: 146 . Discuss options for reducing pill burden ? Discuss option of tapering down patient's omeprazole ? Reports patient's GERD symptoms currently controlled on omperazole 20 mg twice daily ? Discuss non-pharmacologic strategies for reducing acid symptoms ? Plan for  patient to continue to take omeprazole 20 mg QAM and then evening dose only as needed with goal to decrease down to just omeprazole 20 mg once daily if tolerated. ? Patient/wife express interest in reducing/stopping allopurinol ? Currently taking allopurinol 100 mg once daily ? Patient/wife do not recall last time patient had a gout flare ? Note PCP has placed order for uric acid lab work at last visit ? Will plan to f/u regarding possible reduction of dose once lab result back . Mrs. Fleury reports patient's Brilinta stopped since we last spoke. Reports taking warfarin following direction from Coumadin clinic. Denies currently taking aspirin. ? From chart review, see that per 3/10 note from Dr. Saunders Revel, "His INR remains slightly subtherapeutic today.  We will discontinue aspirin and continue ticagrelor and warfarin until INR is therapeutic.  At that point, we will consider discontinuation of ticagrelor and proceeding with warfarin and aspirin in case ticagrelor is contributing to Mr. Haven' shortness of breath." . Place coordination of care call to [facility] and send Google to [place], [place] with clinic. Request call back to patient/wife to confirm current regimen for CAD. [name] Schedule follow up telephone call to complete medication review . Will collaborate with PCP regarding timing of lab work  Patient Self Care Activities:  . Check blood sugar, blood pressure and daily weight as directed by providers  . Patient takes medications, with assistance of wife, as directed o Mrs. Alkire manages patient's medications using weekly pillbox as adherence tool . Patient attends scheduled medical appointments  Please see past updates related to this goal by clicking on the "Past Updates" button in  the selected goal         Patient verbalizes understanding of instructions provided today.   The care management team will reach out to the patient again over the next 14 days.    [name], PharmD, [place] [employer] [phone]

## 2019-06-25 NOTE — Telephone Encounter (Signed)
Called and spoke to patient's wife, ok per DPR. She verbalized understanding that patient is to start back on Brilinta as soon as possible, 90 mg two times a day. Set patient up for INR check on Monday, 06/28/19 at 9:15 am. She said she will start patient on Brilinta this evening.

## 2019-06-25 NOTE — Telephone Encounter (Signed)
Saw patient 1-2 weeks ago for DM Shoes. I did not receive any order form from patient or [facility].  I just called [facility] and they have seen this patient before for shoes. I asked if they can send [place] the order form, and they will fax [place] to [place] to complete now.  [name], [facility] 06/25/2019, 12:36 PM

## 2019-06-25 NOTE — Telephone Encounter (Signed)
Pt  Wife called requesting an order for diabetic shoes fax to [facility]  [phone]

## 2019-06-25 NOTE — Telephone Encounter (Signed)
RE: Current Medications Received: Today Message Contents  End, [name], MD  Kattleya Kuhnert, Eliberto Ivory, RN; Winfield Cunas [name], [facility]  Thank you for the update. Anderson Malta, please have Mr. Grinage start taking Brilinta 90 mg BID until his INR is consistently therapeutic. He should begin taking the medication as soon as possible.   Gerald Stabs       Previous Messages   ----- Message -----  From: Annia Belt, RN  Sent: 06/25/2019  3:49 PM EDT  To: [name], MD, Vella Raring, RPH  Subject: RE: Current Medications              Hi [name],   Thanks for touching base! I will call them to let them know he should still be taking the Brilinta and Aspirin. As far as I know the plan has not changed from what you reported.   It looks like Mr. Torrence missed his last coumadin check as well. I will make Dr End aware.   Thanks,  Meyer Cory, RN  ----- Message -----  From: Vella Raring, [facility]  Sent: 06/25/2019 12:53 PM EDT  To: Eliberto Ivory Cindy Fullman, RN  Subject: Current Medications                Good afternoon, Anderson Malta!   I just left a message with the office, but thought that it might be helpful to send this message as well. I just spoke with Mrs. Rodkey regarding patient's medications.   Reported patient currently taking warfarin following direction from Coumadin clinic. Denies patient currently taking either Brilinta or aspirin.   I saw from chart review per 3/10 note from Dr. Saunders Revel, "His INR remains slightly subtherapeutic today. We will discontinue aspirin and continue ticagrelor and warfarin until INR is therapeutic. At that point, we will consider discontinuation of ticagrelor and proceeding with warfarin and aspirin in case ticagrelor is contributing to Mr. Wickens' shortness of breath."   So just wanted to check in about this regimen and what patient should currently be taking.   I am just about to leave the office until Monday, but  patient and wife reported that they would be home, if you would not mind following up with them directly.   Thank you! I hope that you have a wonderful weekend!   Loraine Maple, PharmD, [place] [employer]  [phone]

## 2019-06-25 NOTE — Telephone Encounter (Signed)
Patients PCP office calling after speaking with patients wife. PCP office wanting [place] to call and go over medications for Lucas Caldwell for clarification   Please advise

## 2019-06-28 ENCOUNTER — Telehealth: Payer: Self-pay | Admitting: *Deleted

## 2019-06-28 ENCOUNTER — Other Ambulatory Visit: Payer: Self-pay

## 2019-06-28 ENCOUNTER — Ambulatory Visit (INDEPENDENT_AMBULATORY_CARE_PROVIDER_SITE_OTHER): Payer: Medicare HMO

## 2019-06-28 DIAGNOSIS — Z7901 Long term (current) use of anticoagulants: Secondary | ICD-10-CM | POA: Diagnosis not present

## 2019-06-28 DIAGNOSIS — I480 Paroxysmal atrial fibrillation: Secondary | ICD-10-CM

## 2019-06-28 LAB — POCT INR: INR: 2.3 (ref 2.0–3.0)

## 2019-06-28 MED ORDER — METOPROLOL SUCCINATE ER 25 MG PO TB24: 25.0000 mg | ORAL_TABLET | Freq: Every day | ORAL | 1 refills | Status: DC

## 2019-06-28 MED ORDER — RANOLAZINE ER 500 MG PO TB12: 500.0000 mg | ORAL_TABLET | Freq: Two times a day (BID) | ORAL | 2 refills | Status: DC

## 2019-06-28 MED ORDER — ISOSORBIDE MONONITRATE ER 60 MG PO TB24: 90.0000 mg | ORAL_TABLET | Freq: Every day | ORAL | 2 refills | Status: DC

## 2019-06-28 NOTE — Telephone Encounter (Signed)
If temporarily missing ticagrelor did not help his shortness of breath, I would recommend restarting ticagrelor 90 mg BID until his is 3 months out from his stent placement with therapeutic INR.  At that point, we can consider switching ticagrelor to aspirin 81 mg daily.  [name], MD [facility]

## 2019-06-28 NOTE — Telephone Encounter (Signed)
Received forms from [place] supply taken care.

## 2019-06-28 NOTE — Telephone Encounter (Signed)
Patient here seeing Coumadin nurse. Needed some refills for medications. Refills for the medications sent to [facility] for patient.  Patient/wife also wondering if he needs to continue the Brilinta. Advised I would need to as Dr End regarding this. They need samples in the meantime.  1 bottle sample given for 4 days until we can verify if patient will be continuing the [place].  Patient states he does not feel much different off the [place].  He still gets short winded and short of breath. Reports/states "the Cardiac Rehab is helping him more than anything." He feels Cardiac is helping him with his shortness of breath. He then said, "Anderson Malta I really don't know if I can tell a difference."  Advised I will route to Dr End for further advice concerning the Brilinta.

## 2019-06-28 NOTE — Patient Instructions (Signed)
-   continue dosage warfarin of 1 TABLET EVERY DAY EXCEPT 1.5 TABLETS ON MONDAYS & FRIDAYS. - recheck INR in 4 weeks.

## 2019-06-28 NOTE — Telephone Encounter (Signed)
Called patient's pharmacy to find out how much the Brilinta will be for them. It is $45 a month.   Called and s/w wife, ok per DPR. She verbalized understanding that patient should continue taking Brilinta 90 mg two times a day for 3 months.  We discussed the cost and she said $45 a month is difficult on a fixed income. Advised that I can supplement with samples as much as possible but they may need to purchase a month or two as we go. They were given one bottle of samples today as leaving their coumadin visit.  Two more bottles will be left at front desk for patient to pick up at his convenience.  She was very appreciative and is in agreement with the plan.  Medication Samples have been provided to the patient.  Drug name: Brilinta       Strength: 90 mg        Qty: 2 bottles  LOT: [id number]  Exp.Date: 04/23

## 2019-06-30 ENCOUNTER — Other Ambulatory Visit: Payer: Self-pay

## 2019-06-30 ENCOUNTER — Encounter: Payer: Medicare HMO | Admitting: *Deleted

## 2019-06-30 DIAGNOSIS — I5042 Chronic combined systolic (congestive) and diastolic (congestive) heart failure: Secondary | ICD-10-CM

## 2019-06-30 DIAGNOSIS — Z79899 Other long term (current) drug therapy: Secondary | ICD-10-CM | POA: Diagnosis not present

## 2019-06-30 DIAGNOSIS — Z794 Long term (current) use of insulin: Secondary | ICD-10-CM | POA: Diagnosis not present

## 2019-06-30 DIAGNOSIS — E119 Type 2 diabetes mellitus without complications: Secondary | ICD-10-CM | POA: Diagnosis not present

## 2019-06-30 DIAGNOSIS — Z87891 Personal history of nicotine dependence: Secondary | ICD-10-CM | POA: Diagnosis not present

## 2019-06-30 DIAGNOSIS — Z7901 Long term (current) use of anticoagulants: Secondary | ICD-10-CM | POA: Diagnosis not present

## 2019-06-30 DIAGNOSIS — N400 Enlarged prostate without lower urinary tract symptoms: Secondary | ICD-10-CM | POA: Diagnosis not present

## 2019-06-30 DIAGNOSIS — I110 Hypertensive heart disease with heart failure: Secondary | ICD-10-CM | POA: Diagnosis not present

## 2019-06-30 DIAGNOSIS — Z7982 Long term (current) use of aspirin: Secondary | ICD-10-CM | POA: Diagnosis not present

## 2019-06-30 NOTE — Progress Notes (Signed)
Daily Session Note  Patient Details  Name: DEANGLEO PASSAGE MRN: [id number] Date of Birth: February 08, 1934 Referring Provider:     Cardiac Rehab from 05/20/2019 in [facility]  Referring Provider  Bensimhon      Encounter Date: 06/30/2019  Check In: Session Check In - 06/30/19 1343      Check-In   Supervising physician immediately available to respond to emergencies  See telemetry face sheet for immediately available ER MD    Location  [facility]-Cardiac & Pulmonary Rehab    Staff Present  Renita Papa, RN BSN;Joseph Hood RCP,RRT,BSRT;Melissa Centerview RDN, LDN    Virtual Visit  No    Medication changes reported      No    Fall or balance concerns reported     No    Warm-up and Cool-down  Performed on first and last piece of equipment    Resistance Training Performed  Yes    VAD Patient?  No    PAD/SET Patient?  No      Pain Assessment   Currently in Pain?  No/denies          Social History   Tobacco Use  Smoking Status Former Smoker  . Packs/day: 2.00  . Years: 15.00  . Pack years: 30.00  . Types: Cigarettes  . Quit date: 70  . Years since quitting: 42.3  Smokeless Tobacco Former Systems developer  Tobacco Comment   quit 1979    Goals Met:  Independence with exercise equipment Exercise tolerated well No report of cardiac concerns or symptoms Strength training completed today  Goals Unmet:  Not Applicable  Comments: Pt able to follow exercise prescription today without complaint.  Will continue to monitor for progression.    Dr. Emily Filbert is Medical Director for [name].

## 2019-07-01 ENCOUNTER — Encounter: Payer: Medicare HMO | Admitting: *Deleted

## 2019-07-01 ENCOUNTER — Other Ambulatory Visit: Payer: Self-pay

## 2019-07-01 DIAGNOSIS — E119 Type 2 diabetes mellitus without complications: Secondary | ICD-10-CM | POA: Diagnosis not present

## 2019-07-01 DIAGNOSIS — I5022 Chronic systolic (congestive) heart failure: Secondary | ICD-10-CM | POA: Diagnosis not present

## 2019-07-01 DIAGNOSIS — N400 Enlarged prostate without lower urinary tract symptoms: Secondary | ICD-10-CM | POA: Diagnosis not present

## 2019-07-01 DIAGNOSIS — Z79899 Other long term (current) drug therapy: Secondary | ICD-10-CM | POA: Diagnosis not present

## 2019-07-01 DIAGNOSIS — I110 Hypertensive heart disease with heart failure: Secondary | ICD-10-CM | POA: Diagnosis not present

## 2019-07-01 DIAGNOSIS — Z87891 Personal history of nicotine dependence: Secondary | ICD-10-CM | POA: Diagnosis not present

## 2019-07-01 DIAGNOSIS — I5042 Chronic combined systolic (congestive) and diastolic (congestive) heart failure: Secondary | ICD-10-CM | POA: Diagnosis not present

## 2019-07-01 DIAGNOSIS — Z7901 Long term (current) use of anticoagulants: Secondary | ICD-10-CM | POA: Diagnosis not present

## 2019-07-01 DIAGNOSIS — Z794 Long term (current) use of insulin: Secondary | ICD-10-CM | POA: Diagnosis not present

## 2019-07-01 DIAGNOSIS — Z7982 Long term (current) use of aspirin: Secondary | ICD-10-CM | POA: Diagnosis not present

## 2019-07-01 NOTE — Progress Notes (Signed)
Daily Session Note  Patient Details  Name: Lucas Caldwell MRN: [id number] Date of Birth: Sep 23, 1933 Referring Provider:     Cardiac Rehab from 05/20/2019 in [facility]  Referring Provider  Bensimhon      Encounter Date: 07/01/2019  Check In: Session Check In - 07/01/19 1333      Check-In   Supervising physician immediately available to respond to emergencies  See telemetry face sheet for immediately available ER MD    Location  [facility]    Staff Present  Heath Lark, RN, BSN, CCRP;Meredith Ritchey, RN BSN;Joseph Hood RCP,RRT,BSRT;Jessica Wheelwright, [place], [place], CCRP, [place], [place], ACSM CEP, Exercise Physiologist    Virtual Visit  No    Medication changes reported      No    Fall or balance concerns reported     No    Warm-up and Cool-down  Performed on first and last piece of equipment    Resistance Training Performed  Yes    VAD Patient?  No    PAD/SET Patient?  No      Pain Assessment   Currently in Pain?  No/denies          Social History   Tobacco Use  Smoking Status Former Smoker  . Packs/day: 2.00  . Years: 15.00  . Pack years: 30.00  . Types: Cigarettes  . Quit date: 28  . Years since quitting: 42.3  Smokeless Tobacco Former Systems developer  Tobacco Comment   quit 1979    Goals Met:  Independence with exercise equipment Exercise tolerated well No report of cardiac concerns or symptoms  Goals Unmet:  Not Applicable  Comments: Pt able to follow exercise prescription today without complaint.  Will continue to monitor for progression.    Dr. Emily Filbert is Medical Director for [name].

## 2019-07-02 ENCOUNTER — Ambulatory Visit (INDEPENDENT_AMBULATORY_CARE_PROVIDER_SITE_OTHER): Payer: Medicare HMO | Admitting: Internal Medicine

## 2019-07-02 ENCOUNTER — Encounter: Payer: Self-pay | Admitting: Internal Medicine

## 2019-07-02 ENCOUNTER — Other Ambulatory Visit: Payer: Self-pay

## 2019-07-02 ENCOUNTER — Telehealth (HOSPITAL_COMMUNITY): Payer: Self-pay | Admitting: Vascular Surgery

## 2019-07-02 VITALS — BP 126/80 | HR 75 | Ht 64.0 in | Wt 202.4 lb

## 2019-07-02 DIAGNOSIS — E785 Hyperlipidemia, unspecified: Secondary | ICD-10-CM | POA: Diagnosis not present

## 2019-07-02 DIAGNOSIS — I480 Paroxysmal atrial fibrillation: Secondary | ICD-10-CM | POA: Diagnosis not present

## 2019-07-02 DIAGNOSIS — I5042 Chronic combined systolic (congestive) and diastolic (congestive) heart failure: Secondary | ICD-10-CM

## 2019-07-02 DIAGNOSIS — N1831 Chronic kidney disease, stage 3a: Secondary | ICD-10-CM | POA: Diagnosis not present

## 2019-07-02 DIAGNOSIS — I25118 Atherosclerotic heart disease of native coronary artery with other forms of angina pectoris: Secondary | ICD-10-CM | POA: Diagnosis not present

## 2019-07-02 NOTE — Progress Notes (Signed)
Follow-up Outpatient Visit Date: 07/02/2019  Primary Care Provider: [name], DO [address] [id number]  Chief Complaint: Follow-up coronary artery disease, heart failure, and paroxysmal atrial fibrillation  HPI:  [name] is a 86 y.o. male with history of coronary artery disease,chronic systolic heart failure due to likelymixed ischemic and nonischemic cardiomyopathy, hypertension, hyperlipidemia, type 2 diabetes mellitus, paroxysmal atrial fibrillation, chronic kidney disease stage III, CML, iron deficiency anemia,GI bleed due to duodenal ulcers,BPH, and hypothyroidism, who presents for follow-up of coronary artery disease and chronic HFrEF.  I last saw Lucas Caldwell a month ago, which time he felt slightly better with a little less shortness of breath.  We agreed to stop aspirin and continue with warfarin and ticagrelor until his INR was consistently therapeutic at which point we would consider stopping ticagrelor to see if his dyspnea would improve.  However, Lucas Caldwell inadvertently stopped ticagrelor and aspirin in the setting of subtherapeutic INR.  This was uncovered by the pharmacy team; he is now back on ticagrelor with most recent INR of 2.3 earlier this week.  He has been participating in cardiac rehab and feels like this is helping his dyspnea.  He has not followed with [name] since I last saw him.  Today, Lucas Caldwell reports that he is starting to feel little bit better.  He notes less shortness of breath and attributes his improvement to cardiac rehab.  His weight has also dropped down to 195 pounds at home, which is the lowest he has been in quite some time.  [name] tolerating his current medications well without side effects, including bleeding.  He denies chest pain and lightheadedness.  He still notes occasional palpitations that are brief, resembling a vibration in his chest.  He has not had any significant edema.  Lucas Caldwell is only using one  of his 3 prescribed respiratory medications, as he has difficulty using the inhalers.  He has not followed up with Dr. Mortimer Fries recently.  --------------------------------------------------------------------------------------------------  Past Medical History:  Diagnosis Date  . Arthritis    "probably in his legs" (08/15/2016)  . Blind right eye   . BPH (benign prostatic hyperplasia)   . Breast asymmetry    Left breast is larger, present for several years.  [name] CAD (coronary artery disease)    a. Cath in the late 90's - reportedly ok;  b. 2014 s/p stenting x 2 @ [facility]; c 08/19/16 Cath/PCI with DES -> RCA, plan to treat LM 70% medically. Seen by surgery and felt to be too high risk for CABG; d. 08/2018 Cath: LM 70 (iFR 0.86), LAD 20ost, 40p, 50/65m D1 70, LCX patent stent, OM1 30, RCA patent stent, 420mSR, 40d, RPDA 30. PCWP 8. CO/CI 4.0/2.1.  . Marland Kitchenhronic combined systolic (congestive) and diastolic (congestive) heart failure ([facility]   a. Previously reduced EF-->50% by echo in 2012;  b. 06/2015 Echo: EF 50-55%  c. 07/2016 Echo: EF 45-50%; d. 12/2017 Echo: EF 55%, Gr1 DD, mild MR; e. 08/2018 Echo: EF 35-40%, mildly dil PA.  . Marland Kitchenhronic kidney disease (CKD), stage IV (severe) (H[facility]  . CML (chronic myelocytic leukemia) ([facility]  . GERD (gastroesophageal reflux disease)   . GIB (gastrointestinal bleeding)    a. 12/2017 3 unit PRBC GIB in setting of coumadin-->Endo/colon multiple duodenal ulcers and a single bleeding ulcer in the proximal ascending colon status post hemostatic clipping x2.  . Gout   . Hyperlipemia   . Hypertension   .  Hypothyroidism   . Iron deficiency anemia   . Ischemic cardiomyopathy    a. Previously reduced EF-->50% by echo in 2012;  b. 06/2015 Echo: EF 50-55%, Gr2 DD, mild MR, mildly dil LA, Ao sclerosis, mild TR;  c. 07/2016 Echo: EF 45-50%; d. 12/2017 Echo: EF 55%, Gr1 DD, mild MR; e. 08/2018 Echo: EF 35-40%.  . Migraine    "in the 1960s" (08/15/2016)  . PAF (paroxysmal atrial fibrillation)  (HCC)    a. ? Dx 2014-->s/p DCCV;  b. CHA2DS2VASc = 6--> Coumadin.  . Prostate cancer ([place])   . RBBB   . Type II diabetes mellitus ([place])    Past Surgical History:  Procedure Laterality Date  . CATARACT EXTRACTION W/ INTRAOCULAR LENS  IMPLANT, BILATERAL Bilateral   . COLONOSCOPY N/A 01/13/2018   Procedure: COLONOSCOPY;  Surgeon: Lin Landsman, MD;  Location: [facility] ENDOSCOPY;  Service: Gastroenterology;  Laterality: N/A;  . COLONOSCOPY WITH PROPOFOL N/A 01/01/2018   Procedure: COLONOSCOPY WITH PROPOFOL;  Surgeon: Lin Landsman, MD;  Location: [facility] ENDOSCOPY;  Service: Gastroenterology;  Laterality: N/A;  . CORONARY ANGIOPLASTY WITH STENT PLACEMENT  09/2012   2 stents  . CORONARY STENT INTERVENTION N/A 08/19/2016   Procedure: Coronary Stent Intervention;  Surgeon: [name], MD;  Location: [place] CV LAB;  Service: Cardiovascular;  Laterality: N/A;  . CORONARY STENT INTERVENTION N/A 04/13/2019   Procedure: CORONARY STENT INTERVENTION;  Surgeon: [name], MD;  Location: [place] CV LAB;  Service: Cardiovascular;  Laterality: N/A;  . ESOPHAGEAL MANOMETRY N/A 12/16/2017   Procedure: ESOPHAGEAL MANOMETRY (EM);  Surgeon: Lin Landsman, MD;  Location: [facility] ENDOSCOPY;  Service: Gastroenterology;  Laterality: N/A;  . ESOPHAGOGASTRODUODENOSCOPY N/A 12/20/2016   Procedure: ESOPHAGOGASTRODUODENOSCOPY (EGD);  Surgeon: Lin Landsman, MD;  Location: [facility] END[facility]COPY;  Service: Gastroenterology;  Laterality: N/A;  . ESOPHAGOGASTRODUODENOSCOPY N/A 01/13/2018   Procedure: ESOPHAGOGASTRODUODENOSCOPY (EGD);  Surgeon: Lin Landsman, MD;  Location: [facility] ENDOSCOPY;  Service: Gastroenterology;  Laterality: N/A;  . ESOPHAGOGASTRODUODENOSCOPY (EGD) WITH PROPOFOL N/A 11/03/2017   Procedure: ESOPHAGOGASTRODUODENOSCOPY (EGD) WITH PROPOFOL;  Surgeon: Lin Landsman, MD;  Location[facility]COPY;  Service: Gastroenterology;  Laterality: N/A;  . EYE SURGERY    . HERNIA  REPAIR     "navel"  . LEFT HEART CATH AND CORONARY ANGIOGRAPHY N/A 08/14/2016   Procedure: Left Heart Cath and Coronary Angiography;  Surgeon: [name], MD;  Location: [place] CV LAB;  Service: Cardiovascular;  Laterality: N/A;  . RIGHT/LEFT HEART CATH AND CORONARY ANGIOGRAPHY N/A 09/01/2018   Procedure: RIGHT/LEFT HEART CATH AND CORONARY ANGIOGRAPHY;  Surgeon: [name], MD;  Location: [place] CV LAB;  Service: Cardiovascular;  Laterality: N/A;  . RIGHT/LEFT HEART CATH AND CORONARY ANGIOGRAPHY N/A 04/13/2019   Procedure: RIGHT/LEFT HEART CATH AND CORONARY ANGIOGRAPHY;  Surgeon: [name], MD;  Location: [place] CV LAB;  Service: Cardiovascular;  Laterality: N/A;  . TOE AMPUTATION Right    "big toe"    Current Meds  Medication Sig  . ACCU-CHEK AVIVA PLUS test strip Use to check blood sugar up to 2 times daily  . Accu-Chek Softclix Lancets lancets Use to check blood sugar up to 2 times daily.  [name] acetaminophen (TYLENOL) 500 MG tablet Take 1,000 mg by mouth every 6 (six) hours as needed for moderate pain or headache.   . Alcohol Swabs 70 % PADS Use when checking blood sugar up to 3x daily  . allopurinol (ZYLOPRIM) 100 MG tablet TAKE ONE TABLET BY MOUTH ONCE DAILY  .  Blood Glucose Monitoring Suppl (ACCU-CHEK AVIVA PLUS) w/Device KIT Use to check blood sugar up to 2 times daily  . bosutinib (BOSULIF) 100 MG tablet Take 100 mg by mouth at bedtime as needed (tired/not feeling well). Take with food.   . Dulaglutide (TRULICITY) 6.50 PT/4.6FK SOPN Inject 0.75 mg into the skin once a week. (Patient taking differently: Inject 0.75 mg into the skin every Saturday. )  . empagliflozin (JARDIANCE) 10 MG TABS tablet Take 10 mg by mouth daily before breakfast.  . gabapentin (NEURONTIN) 300 MG capsule Take 2 capsules (600 mg total) by mouth 2 (two) times daily. Max dose = 1261m daily for kidney function  . hydrALAZINE (APRESOLINE) 50 MG tablet Take 1 tablet (50 mg total) by  mouth 3 (three) times daily.  . Insulin Glargine ([name] KWIKPEN) 100 UNIT/ML SOPN Inject 38 Units into the skin daily.   . Insulin Pen Needle 31G X 5 MM MISC 31 g by Does not apply route as directed.  . isosorbide mononitrate (IMDUR) 60 MG 24 hr tablet Take 1.5 tablets (90 mg total) by mouth daily.  .Marland Kitchenlevothyroxine (SYNTHROID) 25 MCG tablet TAKE 1 TABLET (25 MCG TOTAL) BY MOUTH DAILY BEFORE BREAKFAST.  .Marland Kitchenloperamide (IMODIUM A-D) 2 MG tablet Take 2 mg by mouth 3 (three) times daily as needed for diarrhea or loose stools.   . Menthol-Zinc Oxide (GOLD BOND EX) Apply 1 application topically daily as needed (muscle pain).  . metoprolol succinate (TOPROL-XL) 25 MG 24 hr tablet Take 1 tablet (25 mg total) by mouth daily.  . Multiple Vitamins-Minerals (MULTIVITAMIN ADULTS 50+ PO) Take 1 tablet by mouth every morning.  . nitroGLYCERIN (NITROSTAT) 0.4 MG SL tablet Place 1 tablet (0.4 mg total) under the tongue every 5 (five) minutes x 3 doses as needed for chest pain.  .Marland Kitchenomeprazole (PRILOSEC) 20 MG capsule Take 20 mg by mouth 2 (two) times daily.  .[name] (SYSTANE OP) Place 1 drop into both eyes daily as needed (dry eyes).  . polyethylene glycol (MIRALAX / GLYCOLAX) 17 g packet Take 17 g by mouth daily as needed for mild constipation.   . potassium chloride SA (KLOR-CON) 20 MEQ tablet Take 1 tablet (20 mEq total) by mouth daily. Take 3 tablets the first day only.  .Marland KitchenPROVENTIL HFA 108 (90 Base) MCG/ACT inhaler Inhale 2 puffs into the lungs every 6 (six) hours as needed for wheezing or shortness of breath.  . ranolazine (RANEXA) 500 MG 12 hr tablet Take 1 tablet (500 mg total) by mouth 2 (two) times daily.  . rosuvastatin (CRESTOR) 5 MG tablet Take 1 tablet (5 mg total) by mouth daily.  . ticagrelor (BRILINTA) 90 MG TABS tablet Take 1 tablet (90 mg total) by mouth 2 (two) times daily.  .Marland Kitchentorsemide (DEMADEX) 20 MG tablet Take 3 tablets (60 mg total) by mouth 2 (two) times daily.  .  traMADol (ULTRAM) 50 MG tablet Take 1-2 tablets (50-100 mg total) by mouth at bedtime as needed.  . warfarin (COUMADIN) 3 MG tablet Take 1 tablet (3 mg total) by mouth as directed.    Allergies: Clopidogrel, Ciprofloxacin, Mirtazapine, and Spironolactone  Social History   Tobacco Use  . Smoking status: Former Smoker    Packs/day: 2.00    Years: 15.00    Pack years: 30.00    Types: Cigarettes    Quit date: 1979    Years since quitting: 42.3  . Smokeless tobacco: Former [employer] developer . Tobacco comment: quit 1979  Substance Use Topics  . Alcohol use: No    Comment: previously drank heavily - quit 1979.  . Drug use: No    Family History  Problem Relation Age of Onset  . Brain cancer Father   . Diabetes Sister   . Heart attack Sister     Review of Systems: A 12-system review of systems was performed and was negative except as noted in the HPI.  --------------------------------------------------------------------------------------------------  Physical Exam: BP 126/80 (BP Location: Left Arm, Patient Position: Sitting, Cuff Size: Normal)   Pulse 75   Ht '5\' 4"'  (1.626 m)   Wt 202 lb 6 oz (91.8 kg)   SpO2 98%   BMI 34.74 kg/m   General: NAD.  Accompanied by his wife. Neck: JVP difficult to assess but not significantly elevated. Lungs: Mildly diminished breath sounds throughout without wheezes or crackles. Heart: Regular rate and rhythm without murmurs, rubs, or gallops. Abdomen: Soft, nontender, nondistended. Extremities: Trace pretibial edema.  EKG: Normal sinus rhythm with bifascicular block (RBBB and LAFB) as well as borderline LVH.  No significant change from prior tracings.  Lab Results  Component Value Date   WBC 7.6 04/28/2019   HGB 12.7 (L) 04/28/2019   HCT 39.3 04/28/2019   MCV 86.4 04/28/2019   PLT 212 04/28/2019    Lab Results  Component Value Date   NA 138 07/02/2019   K 4.6 07/02/2019   CL 104 07/02/2019   CO2 16 (L) 07/02/2019   BUN 13 07/02/2019    CREATININE 1.41 (H) 07/02/2019   GLUCOSE 175 (H) 07/02/2019   ALT 14 08/28/2018    Lab Results  Component Value Date   CHOL 110 08/28/2018   HDL 28 (L) 08/28/2018   LDLCALC 51 08/28/2018   TRIG 155 (H) 08/28/2018   CHOLHDL 3.9 08/28/2018    --------------------------------------------------------------------------------------------------  ASSESSMENT AND PLAN: Chronic systolic and diastolic heart failure: Mr. Maffei appears fairly euvolemic on exam and continues to have gradual downtrending in his weight.  He notes that his breathing has improved with cardiac rehab.  He currently reports class III symptoms.  He inquires about de-escalation of his torsemide, though I would like to keep it where it is as long as his renal function has not worsened.  We will check a BMP today.  We will arrange for Mr. Nikolic to follow-up with [name] in about a month.  We will not make any adjustments to his current evidence-based heart failure therapy, consisting of metoprolol, Farxiga, hydralazine, and isosorbide mononitrate.  If his renal function is stable, however, we will consider rechallenging him with an ACE inhibitor or ARB.  Coronary artery disease with stable angina: Mr. Ramsaran reports some improvement in his shortness of breath, which has been an anginal equivalent in the past.  As he is now almost 3 months out from his PCI to the RCA, we have agreed to discontinue ticagrelor when he has exhausted his current supply at the [place] of this month.  At that point, he should begin taking aspirin 81 mg daily in addition to his long-term anticoagulation.  I will continue his antianginal regimen consisting of isosorbide mononitrate, metoprolol, and ranolazine.  Paroxysmal atrial fibrillation: Mr. Hiller notes brief palpitations but has not had any documentation of recurrent atrial fibrillation.  We will continue current dose of metoprolol as well as long-term anticoagulation with warfarin.  Further  escalation of metoprolol is limited by trifascicular block at higher doses.  Chronic kidney disease stage IIIa: I will check a BMP  today in the setting of ongoing diuresis.  Consider adding an ACE inhibitor or ARB back function is stable.  Hyperlipidemia: LDL at goal.  Continue current dose of rosuvastatin.  Follow-up: Return to heart failure clinic in 1 month; see me in 2 months.  [name], MD 07/03/2019 5:13 PM

## 2019-07-02 NOTE — Patient Instructions (Signed)
Medication Instructions:  Your physician has recommended you make the following change in your medication:   In 8 days when you complete your Brilinta supply. Then START Aspirin 81 mg daily.  Continue all you other medications as prescribed.  *If you need a refill on your cardiac medications before your next appointment, please call your pharmacy*   Lab Work: Bmet today. If you have labs (blood work) drawn today and your tests are completely normal, you will receive your results only by: [name] MyChart Message (if you have MyChart) OR . A paper copy in the mail If you have any lab test that is abnormal or we need to change your treatment, we will call you to review the results.   Testing/Procedures: None ordered   Follow-Up: At [facility], you and your health needs are our priority.  As part of our continuing mission to provide you with exceptional heart care, we have created designated Provider Care Teams.  These Care Teams include your primary Cardiologist (physician) and Advanced Practice Providers (APPs -  Physician Assistants and Nurse Practitioners) who all work together to provide you with the care you need, when you need it.  We recommend signing up for the patient portal called "MyChart".  Sign up information is provided on this After Visit Summary.  MyChart is used to connect with patients for Virtual Visits (Telemedicine).  Patients are able to view lab/test results, encounter notes, upcoming appointments, etc.  Non-urgent messages can be sent to your provider as well.   To learn more about what you can do with MyChart, go to [email or web address].    Your next appointment:   2 month(s)   Your physician recommends that you schedule a follow-up appointment in: 4 weeks with Dr. Missy Sabins in [place]. (They will contact you to schedule the appointment)   Please schedule an appointment with Dr. Mortimer Fries (pulmonogy) asap  The format for your next appointment:   In  Person  Provider:   [name], MD   Other Instructions N/A

## 2019-07-02 NOTE — Telephone Encounter (Signed)
Left pt VM ,giving 4 week f/u appt w/ app/ per Dr. Saunders Revel asked pt to call back to confirm appt

## 2019-07-03 ENCOUNTER — Encounter: Payer: Self-pay | Admitting: Internal Medicine

## 2019-07-03 LAB — BASIC METABOLIC PANEL
BUN/Creatinine Ratio: 9 — ABNORMAL LOW (ref 10–24)
BUN: 13 mg/dL (ref 8–27)
CO2: 16 mmol/L — ABNORMAL LOW (ref 20–29)
Calcium: 10.5 mg/dL — ABNORMAL HIGH (ref 8.6–10.2)
Chloride: 104 mmol/L (ref 96–106)
Creatinine, Ser: 1.41 mg/dL — ABNORMAL HIGH (ref 0.76–1.27)
GFR calc Af Amer: 53 mL/min/{1.73_m2} — ABNORMAL LOW (ref >59)
GFR calc non Af Amer: 46 mL/min/{1.73_m2} — ABNORMAL LOW (ref >59)
Glucose: 175 mg/dL — ABNORMAL HIGH (ref 65–99)
Potassium: 4.6 mmol/L (ref 3.5–5.2)
Sodium: 138 mmol/L (ref 134–144)

## 2019-07-05 ENCOUNTER — Telehealth: Payer: Medicare HMO

## 2019-07-05 ENCOUNTER — Telehealth: Payer: Self-pay | Admitting: *Deleted

## 2019-07-05 ENCOUNTER — Telehealth: Payer: Self-pay | Admitting: Internal Medicine

## 2019-07-05 ENCOUNTER — Ambulatory Visit: Payer: Self-pay | Admitting: Pharmacist

## 2019-07-05 DIAGNOSIS — I5042 Chronic combined systolic (congestive) and diastolic (congestive) heart failure: Secondary | ICD-10-CM

## 2019-07-05 DIAGNOSIS — Z79899 Other long term (current) drug therapy: Secondary | ICD-10-CM

## 2019-07-05 DIAGNOSIS — I5022 Chronic systolic (congestive) heart failure: Secondary | ICD-10-CM

## 2019-07-05 DIAGNOSIS — I25118 Atherosclerotic heart disease of native coronary artery with other forms of angina pectoris: Secondary | ICD-10-CM

## 2019-07-05 DIAGNOSIS — I10 Essential (primary) hypertension: Secondary | ICD-10-CM

## 2019-07-05 MED ORDER — LOSARTAN POTASSIUM 25 MG PO TABS: 25.0000 mg | ORAL_TABLET | Freq: Every day | ORAL | 1 refills | Status: DC

## 2019-07-05 NOTE — Telephone Encounter (Signed)
I called and spoke with the patient and he agreed to proceed to the emergency room as recommended by Eric Form, NP. I made Judson Roch aware that he states that he will go to be evaluated.

## 2019-07-05 NOTE — Telephone Encounter (Signed)
-----   Message from [name], MD sent at 07/05/2019  7:25 AM EDT ----- Please let Mr. Degregorio know that his labs are stable.  I recommend that he continue his current diuretic regimen as long as his weight remains stable.  I recommend that we add losartan 25 mg daily, with repeat BMP in 1-2 weeks.

## 2019-07-05 NOTE — Telephone Encounter (Signed)
Please call patient and let him know he needs to be seen. He needs to go to the ED. Saturations need to be > 88%. Minimally he needs a CXR to evaluate for pneumonia vs heart failure. If he continues to refuse to go to the ED he needs to be seen tomorrow somewhere that can do a CXR. Dr. Mortimer Fries has 2 openings tomorrow. If he gets worse not better before he is seen for an  appointment he needs to go to the ED immediately.

## 2019-07-05 NOTE — Telephone Encounter (Signed)
I called and spoke with the patient and he states that he's on 4 liters of oxygen and his oximeter is reading his levels between 73-77%. He states that he has had increased sob and he is unable to do anything. He also has a dry cough. He denies any fever. I advised him that with his sats being that low he will need to head to the ER. He refused. I advised him that I would let the provider know. Please advise.

## 2019-07-05 NOTE — Telephone Encounter (Signed)
Results called to pt and wife. Pt and wife verbalized understanding of results, to start losartan 25 mg daily and get lab work at the [facility] next week anytime. Rx sent to pharmacy. Lab order entered.

## 2019-07-06 ENCOUNTER — Telehealth: Payer: Self-pay | Admitting: Internal Medicine

## 2019-07-06 ENCOUNTER — Ambulatory Visit
Admission: RE | Admit: 2019-07-06 | Discharge: 2019-07-06 | Disposition: A | Discharge status: Home / Self Care | Payer: Medicare HMO | Source: Ambulatory Visit | Attending: Internal Medicine | Admitting: Internal Medicine

## 2019-07-06 ENCOUNTER — Ambulatory Visit: Payer: Medicare HMO | Admitting: Internal Medicine

## 2019-07-06 ENCOUNTER — Other Ambulatory Visit: Payer: Self-pay | Admitting: Internal Medicine

## 2019-07-06 DIAGNOSIS — R0602 Shortness of breath: Secondary | ICD-10-CM | POA: Diagnosis not present

## 2019-07-06 NOTE — Telephone Encounter (Signed)
Called patient back let him know per Dr. Mortimer Fries xray was clear. Dr. Mortimer Fries states we can schedule him on Thursday if needed.  Patient scheduled for 0945

## 2019-07-06 NOTE — Telephone Encounter (Signed)
See other phone note

## 2019-07-06 NOTE — Progress Notes (Unsigned)
PATIENT SOB, CANCELLED 2 APPOINTMENTS WITH ME, WANTS CXR, REFUSES ER VISIT

## 2019-07-06 NOTE — Telephone Encounter (Signed)
Patient is at admitting stating he is short of breath wants Dr. Mortimer Fries to order a cxr.   Order placed by Dr. Mortimer Fries

## 2019-07-06 NOTE — Telephone Encounter (Signed)
Patient call in regards to chest xray done today   Dr. Mortimer Fries please advise

## 2019-07-06 NOTE — Chronic Care Management (AMB) (Signed)
Chronic Care Management   Follow Up Note   07/05/2019 Name: Lucas Caldwell MRN: [id number] DOB: Mar 08, 1934  Referred by: [name], DO Reason for referral : Chronic Care Management (Patient Phone Call)   Lucas Caldwell is a 86 y.o. year old male who is a primary care patient of [name], DO. The CCM team was consulted for assistance with chronic disease management and care coordination needs.  Lucas Caldwell has a past medical history including but not limited to type 2 diabetes, CKD, hyperlipidemia, COPD, atrial fibrillation, hypertension, coronary artery disease, CHF, hypothyroidism, and CML.  I reached out to Lucas Caldwell by phone today. Patient asks that I call back when Lucas Caldwell is home to review medications.  Review of patient status, including review of consultants reports, relevant laboratory and other test results, and collaboration with appropriate care team members and the patient's provider was performed as part of comprehensive patient evaluation and provision of chronic care management services.      Outpatient Encounter Medications as of 07/05/2019  Medication Sig Note  . ACCU-CHEK AVIVA PLUS test strip Use to check blood sugar up to 2 times daily   . Accu-Chek Softclix Lancets lancets Use to check blood sugar up to 2 times daily.   [name] acetaminophen (TYLENOL) 500 MG tablet Take 1,000 mg by mouth every 6 (six) hours as needed for moderate pain or headache.    . Alcohol Swabs 70 % PADS Use when checking blood sugar up to 3x daily   . allopurinol (ZYLOPRIM) 100 MG tablet TAKE ONE TABLET BY MOUTH ONCE DAILY   . Blood Glucose Monitoring Suppl (ACCU-CHEK AVIVA PLUS) w/Device KIT Use to check blood sugar up to 2 times daily   . bosutinib (BOSULIF) 100 MG tablet Take 100 mg by mouth at bedtime as needed (tired/not feeling well). Take with food.  08/31/2018: Pt's wife states he only takes this as needed when he's "not feeling well"  .  Dulaglutide (TRULICITY) 3.66 QH/4.7ML SOPN Inject 0.75 mg into the skin once a week. (Patient taking differently: Inject 0.75 mg into the skin every Saturday. )   . empagliflozin (JARDIANCE) 10 MG TABS tablet Take 10 mg by mouth daily before breakfast.   . gabapentin (NEURONTIN) 300 MG capsule Take 2 capsules (600 mg total) by mouth 2 (two) times daily. Max dose = 1230m daily for kidney function   . hydrALAZINE (APRESOLINE) 50 MG tablet Take 1 tablet (50 mg total) by mouth 3 (three) times daily.   . Insulin Glargine ([name] KWIKPEN) 100 UNIT/ML SOPN Inject 38 Units into the skin daily.    . Insulin Pen Needle 31G X 5 MM MISC 31 g by Does not apply route as directed.   . isosorbide mononitrate (IMDUR) 60 MG 24 hr tablet Take 1.5 tablets (90 mg total) by mouth daily.   .Marland Kitchenlevothyroxine (SYNTHROID) 25 MCG tablet TAKE 1 TABLET (25 MCG TOTAL) BY MOUTH DAILY BEFORE BREAKFAST.   .Marland Kitchenloperamide (IMODIUM A-D) 2 MG tablet Take 2 mg by mouth 3 (three) times daily as needed for diarrhea or loose stools.    .Marland Kitchenlosartan (COZAAR) 25 MG tablet Take 1 tablet (25 mg total) by mouth daily.   . Menthol-Zinc Oxide (GOLD BOND EX) Apply 1 application topically daily as needed (muscle pain).   . metoprolol succinate (TOPROL-XL) 25 MG 24 hr tablet Take 1 tablet (25 mg total) by mouth daily.   . Multiple Vitamins-Minerals (MULTIVITAMIN ADULTS 50+ PO) Take 1 tablet  by mouth every morning.   . nitroGLYCERIN (NITROSTAT) 0.4 MG SL tablet Place 1 tablet (0.4 mg total) under the tongue every 5 (five) minutes x 3 doses as needed for chest pain.   [name] omeprazole (PRILOSEC) 20 MG capsule Take 20 mg by mouth 2 (two) times daily.   Lucas Caldwell Glycol-Propyl Glycol (SYSTANE OP) Place 1 drop into both eyes daily as needed (dry eyes).   . polyethylene glycol (MIRALAX / GLYCOLAX) 17 g packet Take 17 g by mouth daily as needed for mild constipation.    . potassium chloride SA (KLOR-CON) 20 MEQ tablet Take 1 tablet (20 mEq total) by mouth  daily. Take 3 tablets the first day only.   [name] PROVENTIL HFA 108 (90 Base) MCG/ACT inhaler Inhale 2 puffs into the lungs every 6 (six) hours as needed for wheezing or shortness of breath.   . ranolazine (RANEXA) 500 MG 12 hr tablet Take 1 tablet (500 mg total) by mouth 2 (two) times daily.   . rosuvastatin (CRESTOR) 5 MG tablet Take 1 tablet (5 mg total) by mouth daily.   . ticagrelor (BRILINTA) 90 MG TABS tablet Take 1 tablet (90 mg total) by mouth 2 (two) times daily.   [name] torsemide (DEMADEX) 20 MG tablet Take 3 tablets (60 mg total) by mouth 2 (two) times daily.   . traMADol (ULTRAM) 50 MG tablet Take 1-2 tablets (50-100 mg total) by mouth at bedtime as needed.   . warfarin (COUMADIN) 3 MG tablet Take 1 tablet (3 mg total) by mouth as directed.    No facility-administered encounter medications on file as of 07/05/2019.    Goals Addressed            This Visit's Progress   . PharmD - Medication Managment       CARE PLAN ENTRY (see longitudinal plan of care for additional care plan information)  Current Barriers:  . Financial o Note patient has Partial Extra Help/LIS Subsidy . Limited vision . Chronic Disease Management support and education needs related to multiple chronic disease processes  Pharmacist Clinical Goal(s):  [name] Over the next 30 days, patient will work with CM Pharmacist to address needs related to medication adherence and diabetes management  Interventions: . Perform chart review ? Note patient seen by Cardiologist on 4/16 ? Update on plan for Brilinta: patient "to discontinue ticagrelor when he has exhausted his current supply at the end of this month.  At that point, he should begin taking aspirin 81 mg daily in addition to his long-term anticoagulation." ? Based on follow up lab work, patient advised today to start losartan 25 mg daily with repeat BMP in 1-2 weeks . Follow up call to patient and wife. Lucas Caldwell states that he is resting and Lucas Caldwell is not  home. Asks that I call back to review medications with her.  Patient Self Care Activities:  . Check blood sugar, blood pressure and daily weight as directed by providers  . Patient takes medications, with assistance of wife, as directed o Mrs. Grilli manages patient's medications using weekly pillbox as adherence tool . Patient attends scheduled medical appointments  Please see past updates related to this goal by clicking on the "Past Updates" button in the selected goal         Plan  The care management team will reach out to the patient again over the next 10 days.   [name], PharmD, [place] [employer] [phone]

## 2019-07-07 ENCOUNTER — Encounter: Payer: Self-pay | Admitting: *Deleted

## 2019-07-07 ENCOUNTER — Other Ambulatory Visit: Payer: Self-pay

## 2019-07-07 ENCOUNTER — Encounter: Payer: Medicare HMO | Admitting: *Deleted

## 2019-07-07 DIAGNOSIS — I5022 Chronic systolic (congestive) heart failure: Secondary | ICD-10-CM

## 2019-07-07 DIAGNOSIS — I5042 Chronic combined systolic (congestive) and diastolic (congestive) heart failure: Secondary | ICD-10-CM | POA: Diagnosis not present

## 2019-07-07 DIAGNOSIS — Z79899 Other long term (current) drug therapy: Secondary | ICD-10-CM | POA: Diagnosis not present

## 2019-07-07 DIAGNOSIS — Z7901 Long term (current) use of anticoagulants: Secondary | ICD-10-CM | POA: Diagnosis not present

## 2019-07-07 DIAGNOSIS — I110 Hypertensive heart disease with heart failure: Secondary | ICD-10-CM | POA: Diagnosis not present

## 2019-07-07 DIAGNOSIS — N400 Enlarged prostate without lower urinary tract symptoms: Secondary | ICD-10-CM | POA: Diagnosis not present

## 2019-07-07 DIAGNOSIS — Z87891 Personal history of nicotine dependence: Secondary | ICD-10-CM | POA: Diagnosis not present

## 2019-07-07 DIAGNOSIS — Z794 Long term (current) use of insulin: Secondary | ICD-10-CM | POA: Diagnosis not present

## 2019-07-07 DIAGNOSIS — E119 Type 2 diabetes mellitus without complications: Secondary | ICD-10-CM | POA: Diagnosis not present

## 2019-07-07 DIAGNOSIS — Z7982 Long term (current) use of aspirin: Secondary | ICD-10-CM | POA: Diagnosis not present

## 2019-07-07 NOTE — Progress Notes (Signed)
Daily Session Note  Patient Details  Name: Lucas Caldwell MRN: [id number] Date of Birth: 09-15-1933 Referring Provider:     Cardiac Rehab from 05/20/2019 in [facility]  Referring Provider  Bensimhon      Encounter Date: 07/07/2019  Check In: Session Check In - 07/07/19 1357      Check-In   Supervising physician immediately available to respond to emergencies  See telemetry face sheet for immediately available ER MD    Location  [facility]    Staff Present  Renita Papa, RN BSN;Melissa Caiola RDN, LDN;Jessica [place], MA, RCEP, CCRP, CCET;Amanda Sommer, BA, ACSM CEP, Exercise Physiologist    Virtual Visit  No    Medication changes reported      No    Fall or balance concerns reported     No    Warm-up and Cool-down  Performed on first and last piece of equipment    Resistance Training Performed  Yes    VAD Patient?  No    PAD/SET Patient?  No      Pain Assessment   Currently in Pain?  No/denies          Social History   Tobacco Use  Smoking Status Former Smoker  . Packs/day: 2.00  . Years: 15.00  . Pack years: 30.00  . Types: Cigarettes  . Quit date: 79  . Years since quitting: 42.3  Smokeless Tobacco Former Systems developer  Tobacco Comment   quit 1979    Goals Met:  Independence with exercise equipment Exercise tolerated well No report of cardiac concerns or symptoms Strength training completed today  Goals Unmet:  Not Applicable  Comments: Pt able to follow exercise prescription today without complaint.  Will continue to monitor for progression.    Dr. Emily Filbert is Medical Director for [name].

## 2019-07-07 NOTE — Progress Notes (Signed)
Cardiac Individual Treatment Plan  Patient Details  Name: CAPRICE WASKO MRN: [id number] Date of Birth: 1933/12/24 Referring Provider:     Cardiac Rehab from 05/20/2019 in [facility] Cardiac and Pulmonary Rehab  Referring Provider  Bensimhon      Initial Encounter Date:    Cardiac Rehab from 05/20/2019 in [facility] Cardiac and Pulmonary Rehab  Date  05/20/19      Visit Diagnosis: Chronic combined systolic and diastolic congestive heart failure ([place])  Patient's Home Medications on Admission:  Current Outpatient Medications:  .  ACCU-CHEK AVIVA PLUS test strip, Use to check blood sugar up to 2 times daily, Disp: 200 each, Rfl: 3 .  Accu-Chek Softclix Lancets lancets, Use to check blood sugar up to 2 times daily., Disp: 200 each, Rfl: 3 .  acetaminophen (TYLENOL) 500 MG tablet, Take 1,000 mg by mouth every 6 (six) hours as needed for moderate pain or headache. , Disp: , Rfl:  .  Alcohol Swabs 70 % PADS, Use when checking blood sugar up to 3x daily, Disp: 100 each, Rfl: 11 .  allopurinol (ZYLOPRIM) 100 MG tablet, TAKE ONE TABLET BY MOUTH ONCE DAILY, Disp: 90 tablet, Rfl: 3 .  Blood Glucose Monitoring Suppl (ACCU-CHEK AVIVA PLUS) w/Device KIT, Use to check blood sugar up to 2 times daily, Disp: 1 kit, Rfl: 0 .  bosutinib (BOSULIF) 100 MG tablet, Take 100 mg by mouth at bedtime as needed (tired/not feeling well). Take with food. , Disp: , Rfl:  .  Dulaglutide (TRULICITY) 4.23 NT/6.1WE SOPN, Inject 0.75 mg into the skin once a week. (Patient taking differently: Inject 0.75 mg into the skin every Saturday. ), Disp: 4 pen, Rfl: 0 .  empagliflozin (JARDIANCE) 10 MG TABS tablet, Take 10 mg by mouth daily before breakfast., Disp: 90 tablet, Rfl: 3 .  gabapentin (NEURONTIN) 300 MG capsule, Take 2 capsules (600 mg total) by mouth 2 (two) times daily. Max dose = 1236m daily for kidney function, Disp: 360 capsule, Rfl: 1 .  hydrALAZINE (APRESOLINE) 50 MG tablet, Take 1 tablet (50 mg total) by mouth 3 (three)  times daily., Disp: 270 tablet, Rfl: 2 .  Insulin Glargine ([name] KWIKPEN) 100 UNIT/ML SOPN, Inject 38 Units into the skin daily. , Disp: , Rfl:  .  Insulin Pen Needle 31G X 5 MM MISC, 31 g by Does not apply route as directed., Disp: 100 each, Rfl: 5 .  isosorbide mononitrate (IMDUR) 60 MG 24 hr tablet, Take 1.5 tablets (90 mg total) by mouth daily., Disp: 135 tablet, Rfl: 2 .  levothyroxine (SYNTHROID) 25 MCG tablet, TAKE 1 TABLET (25 MCG TOTAL) BY MOUTH DAILY BEFORE BREAKFAST., Disp: 90 tablet, Rfl: 1 .  loperamide (IMODIUM A-D) 2 MG tablet, Take 2 mg by mouth 3 (three) times daily as needed for diarrhea or loose stools. , Disp: , Rfl:  .  losartan (COZAAR) 25 MG tablet, Take 1 tablet (25 mg total) by mouth daily., Disp: 90 tablet, Rfl: 1 .  Menthol-Zinc Oxide (GOLD BOND EX), Apply 1 application topically daily as needed (muscle pain)., Disp: , Rfl:  .  metoprolol succinate (TOPROL-XL) 25 MG 24 hr tablet, Take 1 tablet (25 mg total) by mouth daily., Disp: 90 tablet, Rfl: 1 .  Multiple Vitamins-Minerals (MULTIVITAMIN ADULTS 50+ PO), Take 1 tablet by mouth every morning., Disp: , Rfl:  .  nitroGLYCERIN (NITROSTAT) 0.4 MG SL tablet, Place 1 tablet (0.4 mg total) under the tongue every 5 (five) minutes x 3 doses as needed for chest pain.,  Disp: 25 tablet, Rfl: 4 .  omeprazole (PRILOSEC) 20 MG capsule, Take 20 mg by mouth 2 (two) times daily., Disp: , Rfl:  .  Polyethyl Glycol-Propyl Glycol (SYSTANE OP), Place 1 drop into both eyes daily as needed (dry eyes)., Disp: , Rfl:  .  polyethylene glycol (MIRALAX / GLYCOLAX) 17 g packet, Take 17 g by mouth daily as needed for mild constipation. , Disp: , Rfl:  .  potassium chloride SA (KLOR-CON) 20 MEQ tablet, Take 1 tablet (20 mEq total) by mouth daily. Take 3 tablets the first day only., Disp: 90 tablet, Rfl: 3 .  PROVENTIL HFA 108 (90 Base) MCG/ACT inhaler, Inhale 2 puffs into the lungs every 6 (six) hours as needed for wheezing or shortness of breath.,  Disp: , Rfl:  .  ranolazine (RANEXA) 500 MG 12 hr tablet, Take 1 tablet (500 mg total) by mouth 2 (two) times daily., Disp: 180 tablet, Rfl: 2 .  rosuvastatin (CRESTOR) 5 MG tablet, Take 1 tablet (5 mg total) by mouth daily., Disp: 90 tablet, Rfl: 3 .  ticagrelor (BRILINTA) 90 MG TABS tablet, Take 1 tablet (90 mg total) by mouth 2 (two) times daily., Disp: 60 tablet, Rfl: 6 .  torsemide (DEMADEX) 20 MG tablet, Take 3 tablets (60 mg total) by mouth 2 (two) times daily., Disp: 180 tablet, Rfl: 2 .  traMADol (ULTRAM) 50 MG tablet, Take 1-2 tablets (50-100 mg total) by mouth at bedtime as needed., Disp: 30 tablet, Rfl: 2 .  warfarin (COUMADIN) 3 MG tablet, Take 1 tablet (3 mg total) by mouth as directed., Disp: 45 tablet, Rfl: 1  Past Medical History: Past Medical History:  Diagnosis Date  . Arthritis    "probably in his legs" (08/15/2016)  . Blind right eye   . BPH (benign prostatic hyperplasia)   . Breast asymmetry    Left breast is larger, present for several years.  [name] CAD (coronary artery disease)    a. Cath in the late 90's - reportedly ok;  b. 2014 s/p stenting x 2 @ [facility]; c 08/19/16 Cath/PCI with DES -> RCA, plan to treat LM 70% medically. Seen by surgery and felt to be too high risk for CABG; d. 08/2018 Cath: LM 70 (iFR 0.86), LAD 20ost, 40p, 50/25m D1 70, LCX patent stent, OM1 30, RCA patent stent, 496mSR, 40d, RPDA 30. PCWP 8. CO/CI 4.0/2.1.  . Marland Kitchenhronic combined systolic (congestive) and diastolic (congestive) heart failure ([place]   a. Previously reduced EF-->50% by echo in 2012;  b. 06/2015 Echo: EF 50-55%  c. 07/2016 Echo: EF 45-50%; d. 12/2017 Echo: EF 55%, Gr1 DD, mild MR; e. 08/2018 Echo: EF 35-40%, mildly dil PA.  . Marland Kitchenhronic kidney disease (CKD), stage IV (severe) ([facility]  . CML (chronic myelocytic leukemia) ([facility]  . GERD (gastroesophageal reflux disease)   . GIB (gastrointestinal bleeding)    a. 12/2017 3 unit PRBC GIB in setting of coumadin-->Endo/colon multiple duodenal ulcers and a  single bleeding ulcer in the proximal ascending colon status post hemostatic clipping x2.  . Gout   . Hyperlipemia   . Hypertension   . Hypothyroidism   . Iron deficiency anemia   . Ischemic cardiomyopathy    a. Previously reduced EF-->50% by echo in 2012;  b. 06/2015 Echo: EF 50-55%, Gr2 DD, mild MR, mildly dil LA, Ao sclerosis, mild TR;  c. 07/2016 Echo: EF 45-50%; d. 12/2017 Echo: EF 55%, Gr1 DD, mild MR; e. 08/2018 Echo: EF 35-40%.  . Migraine    "  in the 1960s" (08/15/2016)  . PAF (paroxysmal atrial fibrillation) (HCC)    a. ? Dx 2014-->s/p DCCV;  b. CHA2DS2VASc = 6--> Coumadin.  . Prostate cancer ([place])   . RBBB   . Type II diabetes mellitus (HCC)     Tobacco Use: Social History   Tobacco Use  Smoking Status Former Smoker  . Packs/day: 2.00  . Years: 15.00  . Pack years: 30.00  . Types: Cigarettes  . Quit date: 62  . Years since quitting: 42.3  Smokeless Tobacco Former Systems developer  Tobacco Comment   quit 1979    Labs: Recent Review [facility] Cardiac and Pulmonary Rehab Latest Ref Rng & Units 05/06/2018 08/24/2018 08/28/2018 02/02/2019 04/13/2019   Cholestrol 0 - 200 mg/dL - - 110 - -   LDLCALC 0 - 99 mg/dL - - 51 - -   HDL >40 mg/dL - - 28(L) - -   Trlycerides <150 mg/dL - - 155(H) - -   Hemoglobin A1c 4.8 - 5.6 % 8.7(A) 8.3(A) - 6.6(A) 7.6(H)       Exercise Target Goals: Exercise Program Goal: Individual exercise prescription set using results from initial 6 min walk test and THRR while considering  patient's activity barriers and safety.   Exercise Prescription Goal: Initial exercise prescription builds to 30-45 minutes a day of aerobic activity, 2-3 days per week.  Home exercise guidelines will be given to patient during program as part of exercise prescription that the participant will acknowledge.   Education: Aerobic Exercise & Resistance Training: - Gives group verbal and written instruction on the various components of exercise. Focuses on  aerobic and resistive training programs and the benefits of this training and how to safely progress through these programs..   Education: Exercise & Equipment Safety: - Individual verbal instruction and demonstration of equipment use and safety with use of the equipment.   Cardiac Rehab from 05/20/2019 in [facility] Cardiac and Pulmonary Rehab  Date  05/20/19  Educator  AS  Instruction Review Code  1- Verbalizes Understanding      Education: Exercise Physiology & General Exercise Guidelines: - Group verbal and written instruction with models to review the exercise physiology of the cardiovascular system and associated critical values. Provides general exercise guidelines with specific guidelines to those with heart or lung disease.    Education: Flexibility, Balance, Mind/Body Relaxation: Provides group verbal/written instruction on the benefits of flexibility and balance training, including mind/body exercise modes such as yoga, pilates and tai chi.  Demonstration and skill practice provided.   Activity Barriers & Risk Stratification: Activity Barriers & Cardiac Risk Stratification - 05/19/19 0913      Activity Barriers & Cardiac Risk Stratification   Activity Barriers  Arthritis;Joint Problems;Deconditioning;Muscular Weakness;Shortness of Breath;Assistive Device   knee pain, uses walker   Cardiac Risk Stratification  High       6 Minute Walk: 6 Minute Walk    Row Name 05/20/19 1539         6 Minute Walk   Distance  715 feet     Walk Time  5.35 minutes     # of Rest Breaks  1     MPH  1.54     METS  1.14     RPE  15     Perceived Dyspnea   2     VO2 Peak  4     Symptoms  Yes (comment)     Comments  legs "going to sleep"  from knees down     Resting HR  95 bpm     Resting BP  114/70     Resting Oxygen Saturation   98 %     Exercise Oxygen Saturation  during 6 min walk  92 %     Max Ex. HR  107 bpm     Max Ex. BP  154/68     2 Minute Post BP  124/68        Oxygen  Initial Assessment:   Oxygen Re-Evaluation:   Oxygen Discharge (Final Oxygen Re-Evaluation):   Initial Exercise Prescription: Initial Exercise Prescription - 05/20/19 1500      Date of Initial Exercise RX and Referring Provider   Date  05/20/19    Referring Provider  Bensimhon      Treadmill   MPH  1    Grade  0    Minutes  15      REL-XR   Level  1    Speed  50    Minutes  15    METs  1      Prescription Details   Frequency (times per week)  3    Duration  Progress to 30 minutes of continuous aerobic without signs/symptoms of physical distress      Intensity   THRR 40-80% of Max Heartrate  111-128    Ratings of Perceived Exertion  11-13    Perceived Dyspnea  0-4      Resistance Training   Training Prescription  Yes    Weight  3 lb    Reps  10-15       Perform Capillary Blood Glucose checks as needed.  Exercise Prescription Changes: Exercise Prescription Changes    Row Name 05/20/19 1500 06/09/19 1100 06/22/19 1500         Response to Exercise   Blood Pressure (Admit)  114/70  110/70  140/82     Blood Pressure (Exercise)  154/68  120/80  130/70     Blood Pressure (Exit)  124/68  122/82  124/58     Heart Rate (Admit)  95 bpm  56 bpm  87 bpm     Heart Rate (Exercise)  107 bpm  126 bpm  116 bpm     Heart Rate (Exit)  90 bpm  109 bpm  95 bpm     Oxygen Saturation (Admit)  98 %  --  --     Oxygen Saturation (Exercise)  92 %  --  --     Oxygen Saturation (Exit)  96 %  --  --     Rating of Perceived Exertion (Exercise)  '15  11  12     ' Perceived Dyspnea (Exercise)  2  --  --     Symptoms  legs "going to sleep"  none  none     Comments  L shoulder old injury  second full day of exericse  --     Duration  --  Progress to 30 minutes of  aerobic without signs/symptoms of physical distress  Progress to 30 minutes of  aerobic without signs/symptoms of physical distress     Intensity  --  THRR unchanged  THRR unchanged       Progression   Progression  --   Continue to progress workloads to maintain intensity without signs/symptoms of physical distress.  Continue to progress workloads to maintain intensity without signs/symptoms of physical distress.     Average METs  --  2  1.3  Resistance Training   Training Prescription  --  Yes  Yes     Weight  --  3 lb   3 lb     Reps  --  10-15  10-15       Interval Training   Interval Training  --  No  No       Treadmill   MPH  --  1.3  --     Grade  --  0  --     Minutes  --  15  --     METs  --  2  --       REL-XR   Level  --  1  1     Speed  --  --  50     Minutes  --  15  15     METs  --  2  1.3        Exercise Comments: Exercise Comments    Row Name 06/02/19 1353           Exercise Comments  First full day of exercise!  Patient was oriented to gym and equipment including functions, settings, policies, and procedures.  Patient's individual exercise prescription and treatment plan were reviewed.  All starting workloads were established based on the results of the 6 minute walk test done at initial orientation visit.  The plan for exercise progression was also introduced and progression will be customized based on patient's performance and goals.          Exercise Goals and Review: Exercise Goals    Row Name 05/20/19 1544             Exercise Goals   Increase Physical Activity  Yes       Intervention  Provide advice, education, support and counseling about physical activity/exercise needs.;Develop an individualized exercise prescription for aerobic and resistive training based on initial evaluation findings, risk stratification, comorbidities and participant's personal goals.       Expected Outcomes  Short Term: Attend rehab on a regular basis to increase amount of physical activity.;Long Term: Add in home exercise to make exercise part of routine and to increase amount of physical activity.;Long Term: Exercising regularly at least 3-5 days a week.       Increase Strength  and Stamina  Yes       Intervention  Provide advice, education, support and counseling about physical activity/exercise needs.;Develop an individualized exercise prescription for aerobic and resistive training based on initial evaluation findings, risk stratification, comorbidities and participant's personal goals.       Expected Outcomes  Short Term: Increase workloads from initial exercise prescription for resistance, speed, and METs.;Short Term: Perform resistance training exercises routinely during rehab and add in resistance training at home;Long Term: Improve cardiorespiratory fitness, muscular endurance and strength as measured by increased METs and functional capacity (6MWT)       Able to understand and use rate of perceived exertion (RPE) scale  Yes       Intervention  Provide education and explanation on how to use RPE scale       Expected Outcomes  Short Term: Able to use RPE daily in rehab to express subjective intensity level;Long Term:  Able to use RPE to guide intensity level when exercising independently       Able to understand and use Dyspnea scale  Yes       Intervention  Provide education and explanation on how to use Dyspnea scale  Expected Outcomes  Short Term: Able to use Dyspnea scale daily in rehab to express subjective sense of shortness of breath during exertion;Long Term: Able to use Dyspnea scale to guide intensity level when exercising independently       Knowledge and understanding of Target Heart Rate Range (THRR)  Yes       Intervention  Provide education and explanation of THRR including how the numbers were predicted and where they are located for reference       Expected Outcomes  Short Term: Able to state/look up THRR;Short Term: Able to use daily as guideline for intensity in rehab;Long Term: Able to use THRR to govern intensity when exercising independently       Able to check pulse independently  Yes       Intervention  Provide education and demonstration on  how to check pulse in carotid and radial arteries.;Review the importance of being able to check your own pulse for safety during independent exercise       Expected Outcomes  Short Term: Able to explain why pulse checking is important during independent exercise;Long Term: Able to check pulse independently and accurately       Understanding of Exercise Prescription  Yes       Intervention  Provide education, explanation, and written materials on patient's individual exercise prescription       Expected Outcomes  Short Term: Able to explain program exercise prescription;Long Term: Able to explain home exercise prescription to exercise independently          Exercise Goals Re-Evaluation : Exercise Goals Re-Evaluation    Row Name 06/02/19 1353 06/09/19 1109 06/22/19 1553 06/30/19 1343       Exercise Goal Re-Evaluation   Exercise Goals Review  Able to understand and use rate of perceived exertion (RPE) scale;Understanding of Exercise Prescription;Knowledge and understanding of Target Heart Rate Range (THRR)  Increase Physical Activity;Increase Strength and Stamina;Understanding of Exercise Prescription  Increase Physical Activity;Increase Strength and Stamina;Able to understand and use rate of perceived exertion (RPE) scale;Able to understand and use Dyspnea scale;Knowledge and understanding of Target Heart Rate Range (THRR);Able to check pulse independently;Understanding of Exercise Prescription  Increase Physical Activity;Increase Strength and Stamina;Able to understand and use rate of perceived exertion (RPE) scale;Able to understand and use Dyspnea scale;Knowledge and understanding of Target Heart Rate Range (THRR);Able to check pulse independently;Understanding of Exercise Prescription    Comments  Reviewed RPE scale, THR and program prescription with pt today.  Pt voiced understanding and was given a copy of goals to take home.  Benjamin is off to a good start in rehab.  He has completed his first  two full days of exericse.  [name] now up to 1.3 mph on the treadmill.  We will continue to monitor his progress.  Adelaido only did the XR today.  He did not feel up to the TM.  Staff will monitor progress  Jaiveer is now on the treadmill again. Gehret reports walking at home for exercise; 1 mile 3x/week (RPE12).    Expected Outcomes  Short: Use RPE daily to regulate intensity. Long: Follow program prescription in THR.  Short: Continue to attend rehab regularly  Long: Continue to follow program prescription.  Short: attend consistently Long: improve overall stamina  Short: attend consistently, add some resistence at home Long: improve overall stamina       Discharge Exercise Prescription (Final Exercise Prescription Changes): Exercise Prescription Changes - 06/22/19 1500      Response to Exercise  Blood Pressure (Admit)  140/82    Blood Pressure (Exercise)  130/70    Blood Pressure (Exit)  124/58    Heart Rate (Admit)  87 bpm    Heart Rate (Exercise)  116 bpm    Heart Rate (Exit)  95 bpm    Rating of Perceived Exertion (Exercise)  12    Symptoms  none    Duration  Progress to 30 minutes of  aerobic without signs/symptoms of physical distress    Intensity  THRR unchanged      Progression   Progression  Continue to progress workloads to maintain intensity without signs/symptoms of physical distress.    Average METs  1.3      Resistance Training   Training Prescription  Yes    Weight   3 lb    Reps  10-15      Interval Training   Interval Training  No      REL-XR   Level  1    Speed  50    Minutes  15    METs  1.3       Nutrition:  Target Goals: Understanding of nutrition guidelines, daily intake of sodium <1571m, cholesterol <2022m calories 30% from fat and 7% or less from saturated fats, daily to have 5 or more servings of fruits and vegetables.  Education: Controlling Sodium/Reading Food Labels -Group verbal and written material supporting the discussion of sodium use  in heart healthy nutrition. Review and explanation with models, verbal and written materials for utilization of the food label.   Education: General Nutrition Guidelines/Fats and Fiber: -Group instruction provided by verbal, written material, models and posters to present the general guidelines for heart healthy nutrition. Gives an explanation and review of dietary fats and fiber.   Biometrics: Pre Biometrics - 05/20/19 1545      Pre Biometrics   Height  '5\' 7"'  (1.702 m)    Weight  206 lb (93.4 kg)    BMI (Calculated)  32.26        Nutrition Therapy Plan and Nutrition Goals: Nutrition Therapy & Goals - 05/24/19 0904      Nutrition Therapy   Diet  HH, Low Na, DM    Drug/Food Interactions  Coumadin/Vit K    Protein (specify units)  80-85g    Fiber  30 grams    Whole Grain Foods  3 servings    Saturated Fats  12 max. grams    Fruits and Vegetables  5 servings/day    Sodium  1.5 grams      Personal Nutrition Goals   Nutrition Goal  ST: have one protein food at each meal. Small frequent meals. LT: improving SOB and DOE. 4/10    Comments  Pt reports needing help. SOB with eating. Not much appetite. eggs and grits in am. turnip greens and green beans. Canned peaches. Discussed protein sources with wife and pt. Discussed how right now the focus is on protein and calorie needs as pt is not eating due to SOB, but discussed some HH and DM options as well. Wife goignt o try BBG Chicken. Pt reports not eating many foods high in protein. Fried chicken legs last night with some peaches, BG this morning was 188, usually around 130-150. BG stable throught the day. Discussed HH, DM friendly eating. Discussed protein and calorie needs and how to meet them with SOB such as small frequent meals. Hard for pt to hear, will talk more during class time in person.  Intervention Plan   Intervention  Prescribe, educate and counsel regarding individualized specific dietary modifications aiming towards  targeted core components such as weight, hypertension, lipid management, diabetes, heart failure and other comorbidities.;Nutrition handout(s) given to patient.    Expected Outcomes  Short Term Goal: Understand basic principles of dietary content, such as calories, fat, sodium, cholesterol and nutrients.;Short Term Goal: A plan has been developed with personal nutrition goals set during dietitian appointment.;Long Term Goal: Adherence to prescribed nutrition plan.       Nutrition Assessments: Nutrition Assessments - 05/20/19 1652      MEDFICTS Scores   Pre Score  46       MEDIFICTS Score Key:          ?70 Need to make dietary changes          40-70 Heart Healthy Diet         ? 40 Therapeutic Level Cholesterol Diet  Nutrition Goals Re-Evaluation: Nutrition Goals Re-Evaluation    [place] Name 06/10/19 1348             Goals   Nutrition Goal  ST: have one protein food at each meal. Small frequent meals. LT: improving SOB and DOE. 4/10       Comment  Pt's wife [name] will make all of his meals for him. Wife reports making a protein  food at each meal, mostly chicken. clarified what foods are good sources of protein, wife voiced understanding. Reiterated importance of getting enough protein intake to pt and small frequent meals for his SOB. Pt reports still having SOB. Pt and wife did not recall initial RD consult, will continue to follow up.       Expected Outcome  ST: have one protein food at each meal. Small frequent meals. LT: improving SOB and DOE. 4/10          Nutrition Goals Discharge (Final Nutrition Goals Re-Evaluation): Nutrition Goals Re-Evaluation - 06/10/19 1348      Goals   Nutrition Goal  ST: have one protein food at each meal. Small frequent meals. LT: improving SOB and DOE. 4/10    Comment  Pt's wife [name] will make all of his meals for him. Wife reports making a protein  food at each meal, mostly chicken. clarified what foods are good sources of protein, wife voiced  understanding. Reiterated importance of getting enough protein intake to pt and small frequent meals for his SOB. Pt reports still having SOB. Pt and wife did not recall initial RD consult, will continue to follow up.    Expected Outcome  ST: have one protein food at each meal. Small frequent meals. LT: improving SOB and DOE. 4/10       Psychosocial: Target Goals: Acknowledge presence or absence of significant depression and/or stress, maximize coping skills, provide positive support system. Participant is able to verbalize types and ability to use techniques and skills needed for reducing stress and depression.   Education: Depression - Provides group verbal and written instruction on the correlation between heart/lung disease and depressed mood, treatment options, and the stigmas associated with seeking treatment.   Education: Sleep Hygiene -Provides group verbal and written instruction about how sleep can affect your health.  Define sleep hygiene, discuss sleep cycles and impact of sleep habits. Review good sleep hygiene tips.     Education: Stress and Anxiety: - Provides group verbal and written instruction about the health risks of elevated stress and causes of high stress.  Discuss the correlation between heart/lung  disease and anxiety and treatment options. Review healthy ways to manage with stress and anxiety.    Initial Review & Psychosocial Screening: Initial Psych Review & Screening - 05/19/19 0914      Initial Review   Current issues with  Current Stress Concerns    Source of Stress Concerns  Chronic Illness;Unable to participate in former interests or hobbies;Unable to perform yard/household activities    Comments  Wants more energy to do more, less SOB, wearing oxygen and using cane      Family Dynamics   Good Support System?  Yes   wife, son, daughter     Barriers   Psychosocial barriers to participate in program  Psychosocial barriers identified (see note);The  patient should benefit from training in stress management and relaxation.      Screening Interventions   Interventions  Encouraged to exercise;To provide support and resources with identified psychosocial needs;Provide feedback about the scores to participant    Expected Outcomes  Short Term goal: Utilizing psychosocial counselor, staff and physician to assist with identification of specific Stressors or current issues interfering with healing process. Setting desired goal for each stressor or current issue identified.;Long Term Goal: Stressors or current issues are controlled or eliminated.;Short Term goal: Identification and review with participant of any Quality of Life or Depression concerns found by scoring the questionnaire.;Long Term goal: The participant improves quality of Life and PHQ9 Scores as seen by post scores and/or verbalization of changes       Quality of Life Scores:  Quality of Life - 05/20/19 1545      Quality of Life   Select  Quality of Life      Quality of Life Scores   Health/Function Pre  20.14 %    Socioeconomic Pre  30 %    Psych/Spiritual Pre  30 %    Family Pre  30 %    GLOBAL Pre  25.94 %      Scores of 19 and below usually indicate a poorer quality of life in these areas.  A difference of  2-3 points is a clinically meaningful difference.  A difference of 2-3 points in the total score of the Quality of Life Index has been associated with significant improvement in overall quality of life, self-image, physical symptoms, and general health in studies assessing change in quality of life.  PHQ-9: Recent Review Flowsheet Data    Depression screen [facility] 2/9 05/20/2019 02/02/2019 11/24/2018 11/04/2018 08/24/2018   Decreased Interest 1 0 0 0 0   Down, Depressed, Hopeless 2 0 0 0 0   PHQ - 2 Score 3 0 0 0 0   Altered sleeping 2 0 - - -   Tired, decreased energy 3 0 - - -   Change in appetite 2 0 - - -   Feeling bad or failure about yourself  2 0 - - -   Trouble  concentrating 2 0 - - -   Moving slowly or fidgety/restless 2 0 - - -   Suicidal thoughts 0 0 - - -   PHQ-9 Score 16 0 - - -   Difficult doing work/chores Somewhat difficult - - - -     Interpretation of Total Score  Total Score Depression Severity:  1-4 = Minimal depression, 5-9 = Mild depression, 10-14 = Moderate depression, 15-19 = Moderately severe depression, 20-27 = Severe depression   Psychosocial Evaluation and Intervention: Psychosocial Evaluation - 06/30/19 1345      Psychosocial Evaluation &  Interventions   Interventions  Encouraged to exercise with the program and follow exercise prescription;Stress management education    Comments  Dimitriy reports he is less stressed now that he has been coming to class and exercising at home. ADLs are still hard, but they are manageable; they have improved since starting rehab.    Expected Outcomes  Short: Attend rehab regularly to build stamina and improve breating. Long:Improve stamina.    Continue Psychosocial Services   Follow up required by staff       Psychosocial Re-Evaluation:   Psychosocial Discharge (Final Psychosocial Re-Evaluation):   Vocational Rehabilitation: Provide vocational rehab assistance to qualifying candidates.   Vocational Rehab Evaluation & Intervention: Vocational Rehab - 05/19/19 0913      Initial Vocational Rehab Evaluation & Intervention   Assessment shows need for Vocational Rehabilitation  No   retired      Education: Education Goals: Education classes will be provided on a variety of topics geared toward better understanding of heart health and risk factor modification. Participant will state understanding/return demonstration of topics presented as noted by education test scores.  Learning Barriers/Preferences: Learning Barriers/Preferences - 05/19/19 0914      Learning Barriers/Preferences   Learning Barriers  Sight;Hearing   wears glasses, HOH   Learning Preferences  None        General Cardiac Education Topics:  AED/CPR: - Group verbal and written instruction with the use of models to demonstrate the basic use of the AED with the basic ABC's of resuscitation.   Anatomy & Physiology of the Heart: - Group verbal and written instruction and models provide basic cardiac anatomy and physiology, with the coronary electrical and arterial systems. Review of Valvular disease and Heart Failure   Cardiac Procedures: - Group verbal and written instruction to review commonly prescribed medications for heart disease. Reviews the medication, class of the drug, and side effects. Includes the steps to properly store meds and maintain the prescription regimen. (beta blockers and nitrates)   Cardiac Medications I: - Group verbal and written instruction to review commonly prescribed medications for heart disease. Reviews the medication, class of the drug, and side effects. Includes the steps to properly store meds and maintain the prescription regimen.   Cardiac Medications II: -Group verbal and written instruction to review commonly prescribed medications for heart disease. Reviews the medication, class of the drug, and side effects. (all other drug classes)    Go Sex-Intimacy & Heart Disease, Get SMART - Goal Setting: - Group verbal and written instruction through game format to discuss heart disease and the return to sexual intimacy. Provides group verbal and written material to discuss and apply goal setting through the application of the S.M.A.R.T. Method.   Other Matters of the Heart: - Provides group verbal, written materials and models to describe Stable Angina and Peripheral Artery. Includes description of the disease process and treatment options available to the cardiac patient.   Infection Prevention: - Provides verbal and written material to individual with discussion of infection control including proper hand washing and proper equipment cleaning during  exercise session.   Cardiac Rehab from 05/20/2019 in [facility] Cardiac [facility] Pul[facility]  Date  05/20/19  Educator  AS  Instruction Review Code  1- Verbalizes Understanding      Falls Prevention: - Provides verbal and written material to individual with discussion of falls prevention and safety.   Cardiac Rehab from 05/20/2019 in [facility] Cardiac and Pulmonary Rehab  Date  05/20/19  Educator  AS  Instruction Review Code  1- Verbalizes Understanding      Other: -Provides group and verbal instruction on various topics (see comments)   Knowledge Questionnaire Score: Knowledge Questionnaire Score - 05/20/19 1546      Knowledge Questionnaire Score   Pre Score  22/26       Core Components/Risk Factors/Patient Goals at Admission: Personal Goals and Risk Factors at Admission - 05/20/19 1547      Core Components/Risk Factors/Patient Goals on Admission    Weight Management  Yes;Weight Loss;Obesity    Intervention  Weight Management: Develop a combined nutrition and exercise program designed to reach desired caloric intake, while maintaining appropriate intake of nutrient and fiber, sodium and fats, and appropriate energy expenditure required for the weight goal.;Weight Management: Provide education and appropriate resources to help participant work on and attain dietary goals.;Weight Management/Obesity: Establish reasonable short term and long term weight goals.;Obesity: Provide education and appropriate resources to help participant work on and attain dietary goals.    Admit Weight  206 lb (93.4 kg)    Goal Weight: Short Term  196 lb (88.9 kg)    Goal Weight: Long Term  186 lb (84.4 kg)    Expected Outcomes  Short Term: Continue to assess and modify interventions until short term weight is achieved;Long Term: Adherence to nutrition and physical activity/exercise program aimed toward attainment of established weight goal;Weight Loss: Understanding of general recommendations for a balanced deficit  meal plan, which promotes 1-2 lb weight loss per week and includes a negative energy balance of [phone] kcal/d;Understanding recommendations for meals to include 15-35% energy as protein, 25-35% energy from fat, 35-60% energy from carbohydrates, less than 26m of dietary cholesterol, 20-35 gm of total fiber daily;Understanding of distribution of calorie intake throughout the day with the consumption of 4-5 meals/snacks       Education:Diabetes - Individual verbal and written instruction to review signs/symptoms of diabetes, desired ranges of glucose level fasting, after meals and with exercise. Acknowledge that pre and post exercise glucose checks will be done for 3 sessions at entry of program.   Education: Know Your Numbers and Risk Factors: -Group verbal and written instruction about important numbers in your health.  Discussion of what are risk factors and how they play a role in the disease process.  Review of Cholesterol, Blood Pressure, Diabetes, and BMI and the role they play in your overall health.   Core Components/Risk Factors/Patient Goals Review:  Goals and Risk Factor Review    Row Name 06/30/19 1347             Core Components/Risk Factors/Patient Goals Review   Personal Goals Review  Weight Management/Obesity;Heart Failure;Improve shortness of breath with ADL's;Stress;Hypertension       Review  WShubhis taking his medications as directed. [name] managing his stress with exercise and his support system. He reports improving with ADLs.       Expected Outcomes  ST: continue taking medications as directed, continue managing stress levels, continue with exercise and nutrition regimen          Core Components/Risk Factors/Patient Goals at Discharge (Final Review):  Goals and Risk Factor Review - 06/30/19 1347      Core Components/Risk Factors/Patient Goals Review   Personal Goals Review  Weight Management/Obesity;Heart Failure;Improve shortness of breath with  ADL's;Stress;Hypertension    Review  WBaraais taking his medications as directed. [name] managing his stress with exercise and his support system. He reports improving with ADLs.  Expected Outcomes  ST: continue taking medications as directed, continue managing stress levels, continue with exercise and nutrition regimen       ITP Comments: ITP Comments    Row Name 05/19/19 0936 05/24/19 0936 06/02/19 1353 06/09/19 0602 07/07/19 0528   ITP Comments  Completed virtual orientation today.  EP evaluation is scheduled for Thursday 3/4 at 2pm.  Documentation for diagnosis can be found in [facility] encounter 05/14/19.  Pt had oriented on 02/26/19 but never came in for his walk test for various reasons.  Now he is coming in for Cardiac Rehab.  Completed Initial RD Eval  First full day of exercise!  Patient was oriented to gym and equipment including functions, settings, policies, and procedures.  Patient's individual exercise prescription and treatment plan were reviewed.  All starting workloads were established based on the results of the 6 minute walk test done at initial orientation visit.  The plan for exercise progression was also introduced and progression will be customized based on patient's performance and goals.  30 day chart review completed. ITP sent to Dr Zachery Dakins Medical Director, for review,changes as needed and signature. Continue with ITP if no changes requested  New to program  30 Day review completed. Medical Director review done, changes made as directed,and approval shown by signature of Market researcher.      Comments:

## 2019-07-08 ENCOUNTER — Ambulatory Visit (INDEPENDENT_AMBULATORY_CARE_PROVIDER_SITE_OTHER): Payer: Medicare HMO | Admitting: Internal Medicine

## 2019-07-08 ENCOUNTER — Other Ambulatory Visit: Payer: Self-pay

## 2019-07-08 ENCOUNTER — Encounter: Payer: Self-pay | Admitting: Internal Medicine

## 2019-07-08 ENCOUNTER — Telehealth: Payer: Self-pay | Admitting: Internal Medicine

## 2019-07-08 VITALS — BP 120/68 | HR 92 | Temp 97.5°F | Ht 64.0 in | Wt 199.2 lb

## 2019-07-08 DIAGNOSIS — J9611 Chronic respiratory failure with hypoxia: Secondary | ICD-10-CM

## 2019-07-08 DIAGNOSIS — R0602 Shortness of breath: Secondary | ICD-10-CM | POA: Diagnosis not present

## 2019-07-08 NOTE — Telephone Encounter (Signed)
S/w Lucas Caldwell who reports still feeling short of breath at rest. Denies swelling, chest pain. Feels lightheaded at times and like "hard to walk straight." Doesn't think its worse but not better.   He saw Dr Mortimer Fries this morning. Lucas Caldwell asked about his pumping of his heart only being 30%. Advised this can be a cause of this "short winded" feeling at this time. Explained this feeling may be his baseline or normal for the time being though he should let us know if it gets worse. Advised him and his wife the plan of care is to be seen in the [facility] (scheduled for 08/03/19) and continue current medications. Pulmonology is also ordering a sleep study.  Lucas Caldwell would like a sooner appointment at the [facility] clinic. I will send a message to their scheduling nurse to see if that's possible. Routing to Dr End for any further advice.

## 2019-07-08 NOTE — Telephone Encounter (Signed)
Pt c/o Shortness Of Breath: STAT if SOB developed within the last 24 hours or pt is noticeably SOB on the phone  1. Are you currently SOB (can you hear that pt is SOB on the phone)?yes  2. How long have you been experiencing SOB? For a while  3. Are you SOB when sitting or when up moving around? both  4. Are you currently experiencing any other symptoms? No other symptoms

## 2019-07-08 NOTE — Telephone Encounter (Signed)
These are all chronic problems.  He actually felt a little better when I saw him last week.  If he feels like his lightheadedness has worsened and he has started taking losartan as recommended last week, I suggest that he stop taking losartan and follow-up with advanced heart failure clinic when possible.  [name], MD [facility]

## 2019-07-08 NOTE — Telephone Encounter (Signed)
[name] notified and verbalized understanding of Dr Darnelle Bos recommendations. Wife said patient is the same since starting losartan. I advised to continue it if it is not causing lightheadedness.

## 2019-07-08 NOTE — Progress Notes (Signed)
Name: Lucas Caldwell MRN: [id number] DOB: 1933-12-15     CONSULTATION DATE: 07/08/2019  REFERRING MD : END  CHIEF COMPLAINT: Follow-up shortness of breath  STUDIES:     CXR independently reviewed by Me today 12/2017 NL CXR No edema No effusions  **Pulmonary function tests 01/05/2019 Moderate restrictive lung disease no obstructive lung disease noted  **Chest x-ray 9/20 No acute or obvious signs of interstitial lung disease however there it may be left lower lobe atelectasis due to body habitus  **CXR 07/06/19 NO acute or obvious signs of pneumonia or ILD   Studies have reviewed been reviewed with the patient   CC Follow-up shortness of breath and work of breathing    HISTORY OF PRESENT ILLNESS: Patient still remains short winded and shortness of breath No signs of distress at this time No overt signs of COPD exacerbation Pulmonary function test did not show any evidence of obstructive lung disease however it does show some restrictive lung disease due to body habitus Patient does have a significant cardiomyopathy EF of 30% with severe hypokinesis  I did explain to the patient that his progressive shortness of breath more likely related to his body habitus and cardiomyopathy   No evidence of heart failure at this time No evidence or signs of infection at this time No respiratory distress No fevers, chills, nausea, vomiting, diarrhea + extremity edema No evidence hemoptysis  Patient needs 6-minute walk test and sleep study as previously ordered Patient uses oxygen at nighttime for chronic hypoxic respiratory failure due to his cardiomyopathy Patient uses and benefits from oxygen therapy Patient has increased oxygen requirements to 4 L nasal cannula  PAST MEDICAL HISTORY :   has a past medical history of Arthritis, Blind right eye, BPH (benign prostatic hyperplasia), Breast asymmetry, CAD (coronary artery disease), Chronic combined systolic (congestive) and  diastolic (congestive) heart failure (HCC), Chronic kidney disease (CKD), stage IV (severe) ([place]), CML (chronic myelocytic leukemia) ([place]), GERD (gastroesophageal reflux disease), GIB (gastrointestinal bleeding), Gout, Hyperlipemia, Hypertension, Hypothyroidism, Iron deficiency anemia, Ischemic cardiomyopathy, Migraine, PAF (paroxysmal atrial fibrillation) ([place]), Prostate cancer ([place]), RBBB, and Type II diabetes mellitus ([place]).  has a past surgical history that includes Toe amputation (Right); LEFT HEART CATH AND CORONARY ANGIOGRAPHY (N/A, 08/14/2016); Hernia repair; Cataract extraction w/ intraocular lens  implant, bilateral (Bilateral); Coronary angioplasty with stent (09/2012); CORONARY STENT INTERVENTION (N/A, 08/19/2016); Esophagogastroduodenoscopy (N/A, 12/20/2016); Esophagogastroduodenoscopy (egd) with propofol (N/A, 11/03/2017); Esophageal manometry (N/A, 12/16/2017); Eye surgery; Colonoscopy with propofol (N/A, 01/01/2018); Esophagogastroduodenoscopy (N/A, 01/13/2018); Colonoscopy (N/A, 01/13/2018); RIGHT/LEFT HEART CATH AND CORONARY ANGIOGRAPHY (N/A, 09/01/2018); RIGHT/LEFT HEART CATH AND CORONARY ANGIOGRAPHY (N/A, 04/13/2019); and CORONARY STENT INTERVENTION (N/A, 04/13/2019). Prior to Admission medications   Medication Sig Start Date End Date Taking? Authorizing Provider  ACCU-CHEK AVIVA PLUS test strip Use to check blood sugar up to 2 times daily 11/25/18  Yes Karamalegos, [name], DO  Accu-Chek Softclix Lancets lancets Use to check blood sugar up to 2 times daily. 11/25/18  Yes Karamalegos, [name], DO  acetaminophen (TYLENOL) 500 MG tablet Take 1,000 mg by mouth every 6 (six) hours as needed for moderate pain or headache.    Yes [provider]  Alcohol Swabs 70 % PADS Use when checking blood sugar up to 3x daily 04/09/17  Yes Karamalegos, [name], DO  allopurinol (ZYLOPRIM) 100 MG tablet TAKE 1 TABLET EVERY DAY 09/28/18  Yes Karamalegos, [name], DO  Blood Glucose Monitoring Suppl  (ACCU-CHEK AVIVA PLUS) w/Device KIT Use to check blood sugar  up to 2 times daily 11/25/18  Yes Karamalegos, [name], DO  bosutinib (BOSULIF) 100 MG tablet Take 100 mg by mouth at bedtime as needed (tired/not feeling well). Take with food.    Yes [provider]  Dulaglutide (TRULICITY) 1.88 CZ/6.6AY SOPN Inject 0.75 mg into the skin once a week. Patient taking differently: Inject 0.75 mg into the skin every Saturday.  10/31/17  Yes Karamalegos, Alexander J, DO  enalapril (VASOTEC) 5 MG tablet TAKE ONE TABLET BY MOUTH EVERY DAY 09/02/18  Yes Karamalegos, [name], DO  gabapentin (NEURONTIN) 300 MG capsule Take 2 capsules (600 mg total) by mouth 2 (two) times daily. Max dose = 1260m daily for kidney function 07/12/18  Yes Karamalegos, ADevonne Doughty DO  Insulin Glargine ([name] KWIKPEN) 100 UNIT/ML SOPN Inject 60 Units into the skin daily.   Yes [provider]  Insulin Pen Needle 31G X 5 MM MISC 31 g by Does not apply route as directed. 04/16/17  Yes K[employer] NP  isosorbide mononitrate (IMDUR) 60 MG 24 hr tablet Take 1.5 tablets (90 mg total) by mouth daily. 11/25/18 02/23/19 Yes End, CHarrell Gave MD  levothyroxine (SYNTHROID) 25 MCG tablet TAKE 1 TABLET (25 MCG TOTAL) BY MOUTH DAILY BEFORE BREAKFAST. 09/28/18  Yes Karamalegos, ADevonne Doughty DO  loperamide (IMODIUM A-D) 2 MG tablet Take 2 mg by mouth 3 (three) times daily as needed for diarrhea or loose stools.    Yes [provider]  Menthol-Zinc Oxide (GOLD BOND EX) Apply 1 application topically daily as needed (muscle pain).   Yes [provider]  metoprolol succinate (TOPROL XL) 25 MG 24 hr tablet Take 1 tablet (25 mg total) by mouth daily. 03/06/18  Yes End, CHarrell Gave MD  Multiple Vitamins-Minerals (MULTIVITAMIN ADULTS 50+ PO) Take 1 tablet by mouth every morning.   Yes [provider]  nitroGLYCERIN (NITROSTAT) 0.4 MG SL tablet Place 1 tablet (0.4 mg total) under the tongue every 5 (five)  minutes x 3 doses as needed for chest pain. 04/22/18  Yes BTheora Gianotti NP  omeprazole (PRILOSEC) 40 MG capsule Take 1 capsule (40 mg total) by mouth 2 (two) times daily. 11/03/18 12/03/18 Yes Vanga, RTally Due MD  Polyethyl Glycol-Propyl Glycol (SYSTANE OP) Place 1 drop into both eyes daily as needed (dry eyes).   Yes [provider]  PROVENTIL HFA 108 (90 Base) MCG/ACT inhaler Inhale 2 puffs into the lungs every 6 (six) hours as needed for wheezing or shortness of breath. 07/16/18  Yes Karamalegos, ADevonne Doughty DO  ranolazine (RANEXA) 500 MG 12 hr tablet Take 1 tablet (500 mg total) by mouth 2 (two) times daily. 11/11/18  Yes End, CHarrell Gave MD  torsemide (DEMADEX) 20 MG tablet Take 1 tablet (20 mg total) by mouth 2 (two) times daily. 10/16/18  Yes BTheora Gianotti NP  traMADol (ULTRAM) 50 MG tablet Take 1 tablet (50 mg total) by mouth every 8 (eight) hours as needed for moderate pain. 11/25/18  Yes Karamalegos, ADevonne Doughty DO  warfarin (COUMADIN) 3 MG tablet Take 1 tablet (3 mg total) by mouth daily. Take as directed by the coumadin clinic. Patient taking differently: Take 3-4.5 mg by mouth See admin instructions. Take 3 mg at night on Sun, Tue, Wed, Thur, and Sat.  Take 4.5 mg at night on Mon and Fri. 04/17/18  Yes End, CHarrell Gave MD  rosuvastatin (CRESTOR) 5 MG tablet Take 1 tablet (5 mg total) by mouth daily. Patient taking differently: Take 5 mg by mouth at  bedtime.  07/14/18 11/24/18  End, [name], MD   Allergies  Allergen Reactions  . Clopidogrel Anaphylaxis    altered mental status;  (electrolytes "out of wack" and confusion per family; THEY DO NOT REMEMBER ANY OTHER REACTION)- MSB 10/10/15  . Ciprofloxacin Itching  . Mirtazapine Diarrhea    Bad dreams   . Spironolactone     gynecomastia    FAMILY HISTORY:  family history includes Brain cancer in his father; Diabetes in his sister; Heart attack in his sister. SOCIAL HISTORY:  reports that he quit  smoking about 42 years ago. His smoking use included cigarettes. He has a 30.00 pack-year smoking history. He has quit using smokeless tobacco. He reports that he does not drink alcohol or use drugs.     Review of Systems:  Gen:  Denies  fever, sweats, chills weight loss  HEENT: Denies blurred vision, double vision, ear pain, eye pain, hearing loss, nose bleeds, sore throat Cardiac:  No dizziness, chest pain or heaviness, chest tightness,edema, No JVD Resp:   No cough, -sputum production, -shortness of breath,-wheezing, -hemoptysis,  Gi: Denies swallowing difficulty, stomach pain, nausea or vomiting, diarrhea, constipation, bowel incontinence Gu:  Denies bladder incontinence, burning urine Ext:   Denies Joint pain, stiffness or swelling Skin: Denies  skin rash, easy bruising or bleeding or hives Endoc:  Denies polyuria, polydipsia , polyphagia or weight change Psych:   Denies depression, insomnia or hallucinations  Other:  All other systems negative   BP 120/68 (BP Location: Right Arm, Cuff Size: Normal)   Pulse 92   Temp (!) 97.5 F (36.4 C) (Temporal)   Ht '5\' 4"'  (1.626 m)   Wt 199 lb 3.2 oz (90.4 kg)   SpO2 99%   BMI 34.19 kg/m    Physical Examination:   General Appearance: No distress  Neuro:without focal findings,  speech normal,  HEENT: PERRLA, EOM intact.   Pulmonary: normal breath sounds, No wheezing.  CardiovascularNormal S1,S2.  No m/r/g.   Abdomen: Benign, Soft, non-tender. Renal:  No costovertebral tenderness  GU:  Not performed at this time. Endoc: No evident thyromegaly Skin:   warm, no rashes, no ecchymosis  Extremities: normal, no cyanosis, clubbing. PSYCHIATRIC: Mood, affect within normal limits.   ALL OTHER ROS ARE NEGATIVE       MEDICATIONS: I have reviewed all medications and confirmed regimen as documented     ECHO 08/2018 1. Severe hypokinesis of the left ventricular, entire inferior wall.  2. The left ventricle has moderately reduced  systolic function, with an ejection fraction of 35-40%. The cavity size was normal. There is mildly increased left ventricular wall thickness. Left ventricular diastolic Doppler parameters are indeterminate.  Left ventricular diffuse hypokinesis.  3. The tricuspid valve is grossly normal.  4. The aortic valve is tricuspid.  5. Mildly dilated pulmonary artery.  6. The interatrial septum was not well visualized.     ASSESSMENT AND PLAN SYNOPSIS  86 year old pleasant African-American male with follow-up for assessment of his progressive shortness of breath and dyspnea exertion over the last several months Recent chest x-ray does not show any significant changes which leads me to believe that his cardiomyopathy is more likely causing his shortness of breath His pulmonary function test do not show any significant obstructive lung disease but has very mild restrictive lung disease due to his body habitus  Patient does have a severe cardiomyopathy EF of 30% He also has underlying sleep apnea and needs a sleep study at this time  Patient does  have a significant former smoking history however there is no evidence of significant obstructive lung disease on spirometry and pulmonary function testing  We will need to assess for exertional hypoxia with 6-minute walk test Walk test pending  Chronic hypoxic respiratory failure Continue oxygen therapy at night Uses and benefits from oxygen therapy and needs this for survival  COVID-19 EDUCATION: The signs and symptoms of COVID-19 were discussed with the patient and how to seek care for testing.  The importance of social distancing was discussed today. Hand Washing Techniques and avoid touching face was advised.     MEDICATION ADJUSTMENTS/LABS AND TESTS ORDERED: Obtain sleep study Obtain 6-minute walk test Continue oxygen therapy as prescribed  CURRENT MEDICATIONS REVIEWED AT LENGTH WITH PATIENT TODAY   Patient satisfied with Plan of  action and management. All questions answered  Follow up in 6 months  Total time spent 32 minutes  Corrin Parker, M.D.  [facility]  Medical Director [place] Director [facility] Cardio-Pulmonary Department

## 2019-07-08 NOTE — Patient Instructions (Signed)
Obtain HOME SLEEP TEST  OBTAIN 6MWT  CONTINUE OXYGEN AT NIGHT

## 2019-07-09 ENCOUNTER — Ambulatory Visit: Payer: Medicare HMO | Admitting: Pharmacist

## 2019-07-09 DIAGNOSIS — I10 Essential (primary) hypertension: Secondary | ICD-10-CM

## 2019-07-09 DIAGNOSIS — E1121 Type 2 diabetes mellitus with diabetic nephropathy: Secondary | ICD-10-CM | POA: Diagnosis not present

## 2019-07-09 DIAGNOSIS — I25118 Atherosclerotic heart disease of native coronary artery with other forms of angina pectoris: Secondary | ICD-10-CM

## 2019-07-09 DIAGNOSIS — Z794 Long term (current) use of insulin: Secondary | ICD-10-CM

## 2019-07-09 NOTE — Chronic Care Management (AMB) (Signed)
Chronic Care Management   Follow Up Note   07/09/2019 Name: Lucas Caldwell MRN: [id number] DOB: Jul 10, 1933  Referred by: [name], DO Reason for referral : Chronic Care Management (Patient Phone Call)   Lucas Caldwell is a 86 y.o. year old male who is a primary care patient of [name], DO. The CCM team was consulted for assistance with chronic disease management and care coordination needs. Lucas Caldwell has a past medical history including but not limited to type 2 diabetes, CKD, hyperlipidemia, COPD, atrial fibrillation, hypertension, coronary artery disease, CHF, hypothyroidism, and CML.  I reached out to Lucas Caldwell and his wife by phone today.   Review of patient status, including review of consultants reports, relevant laboratory and other test results, and collaboration with appropriate care team members and the patient's provider was performed as part of comprehensive patient evaluation and provision of chronic care management services.      Outpatient Encounter Medications as of 07/09/2019  Medication Sig Note  . acetaminophen (TYLENOL) 500 MG tablet Take 1,000 mg by mouth every 6 (six) hours as needed for moderate pain or headache.    . allopurinol (ZYLOPRIM) 100 MG tablet TAKE ONE TABLET BY MOUTH ONCE DAILY   . bosutinib (BOSULIF) 100 MG tablet Take 100 mg by mouth at bedtime as needed (tired/not feeling well). Take with food.  08/31/2018: Pt's wife states he only takes this as needed when he's "not feeling well"  . Dulaglutide (TRULICITY) 9.38 BO/1.7PZ SOPN Inject 0.75 mg into the skin once a week. (Patient taking differently: Inject 0.75 mg into the skin every Saturday. )   . empagliflozin (JARDIANCE) 10 MG TABS tablet Take 10 mg by mouth daily before breakfast.   . gabapentin (NEURONTIN) 300 MG capsule Take 2 capsules (600 mg total) by mouth 2 (two) times daily. Max dose = 1249m daily for kidney function   . hydrALAZINE (APRESOLINE) 50  MG tablet Take 1 tablet (50 mg total) by mouth 3 (three) times daily.   . Insulin Glargine ([name] KWIKPEN) 100 UNIT/ML SOPN Inject 38 Units into the skin daily.    . isosorbide mononitrate (IMDUR) 60 MG 24 hr tablet Take 1.5 tablets (90 mg total) by mouth daily.   .Marland Kitchenlevothyroxine (SYNTHROID) 25 MCG tablet TAKE 1 TABLET (25 MCG TOTAL) BY MOUTH DAILY BEFORE BREAKFAST.   .Marland Kitchenlosartan (COZAAR) 25 MG tablet Take 1 tablet (25 mg total) by mouth daily.   . Menthol-Zinc Oxide (GOLD BOND EX) Apply 1 application topically daily as needed (muscle pain).   . metoprolol succinate (TOPROL-XL) 25 MG 24 hr tablet Take 1 tablet (25 mg total) by mouth daily.   . Multiple Vitamins-Minerals (MULTIVITAMIN ADULTS 50+ PO) Take 1 tablet by mouth every morning.   . nitroGLYCERIN (NITROSTAT) 0.4 MG SL tablet Place 1 tablet (0.4 mg total) under the tongue every 5 (five) minutes x 3 doses as needed for chest pain.   .Marland Kitchenomeprazole (PRILOSEC) 20 MG capsule Take 20 mg by mouth daily.    .[name] (SYSTANE OP) Place 1 drop into both eyes daily as needed (dry eyes).   . polyethylene glycol (MIRALAX / GLYCOLAX) 17 g packet Take 17 g by mouth daily as needed for mild constipation.    . potassium chloride SA (KLOR-CON) 20 MEQ tablet Take 1 tablet (20 mEq total) by mouth daily. Take 3 tablets the first day only.   . ranolazine (RANEXA) 500 MG 12 hr tablet Take 1 tablet (500  mg total) by mouth 2 (two) times daily.   . rosuvastatin (CRESTOR) 5 MG tablet Take 1 tablet (5 mg total) by mouth daily.   . ticagrelor (BRILINTA) 90 MG TABS tablet Take 1 tablet (90 mg total) by mouth 2 (two) times daily.   [name] torsemide (DEMADEX) 20 MG tablet Take 3 tablets (60 mg total) by mouth 2 (two) times daily.   . traMADol (ULTRAM) 50 MG tablet Take 1-2 tablets (50-100 mg total) by mouth at bedtime as needed.   . warfarin (COUMADIN) 3 MG tablet Take 1 tablet (3 mg total) by mouth as directed.   [name] ACCU-CHEK AVIVA PLUS test strip Use  to check blood sugar up to 2 times daily   . Accu-Chek Softclix Lancets lancets Use to check blood sugar up to 2 times daily.   . Alcohol Swabs 70 % PADS Use when checking blood sugar up to 3x daily   . Blood Glucose Monitoring Suppl (ACCU-CHEK AVIVA PLUS) w/Device KIT Use to check blood sugar up to 2 times daily   . Insulin Pen Needle 31G X 5 MM MISC 31 g by Does not apply route as directed.   [name] PROVENTIL HFA 108 (90 Base) MCG/ACT inhaler Inhale 2 puffs into the lungs every 6 (six) hours as needed for wheezing or shortness of breath.    No facility-administered encounter medications on file as of 07/09/2019.    Goals Addressed            This Visit's Progress   . PharmD - Medication Managment       CARE PLAN ENTRY (see longitudinal plan of care for additional care plan information)  Current Barriers:  . Financial o Note patient has Partial Extra Help/LIS Subsidy . Limited vision . Chronic Disease Management support and education needs related to multiple chronic disease processes  Pharmacist Clinical Goal(s):  [name] Over the next 30 days, patient will work with CM Pharmacist to address needs related to medication adherence and diabetes management  Interventions: . Performed chart review ? Patient seen by Cardiologist on 4/16 ? Update on plan for Brilinta: patient "to discontinue ticagrelor when he has exhausted his current supply at the end of this month.  At that point, he should begin taking aspirin 81 mg daily in addition to his long-term anticoagulation." ? Based on follow up lab work, patient advised today to start losartan 25 mg daily with repeat BMP in 1-2 weeks ? Patient seen by Pulmonologist on 4/22, patient instructed to: ? Obtain home sleep test ? Obtain 6MWT ? Continue Oxygen at night . Follow up with Mrs. Chamorro regarding home sleep test and 6MWT. Reports instructed will receive call from [facility] Pulmonology office regarding scheduling . Comprehensive medication  review performed; medication list updated in electronic medical record ? From chart review note Spiriva inhaler previously discontinued by Pulmonologist 02/17/19. Note Spiriva not included on med list from visit with Pulmonologist yesterday and patient denies using ? Mrs. Settles reports patient will run out of Brilinta tomorrow. Confirms once out of Brilinta, will begin taking aspirin 81 mg daily in addition to his long-term anticoagulation (warfarin) as directed by Cardiologist ? Reports patient's GERD symptoms controlled with reduced PPI dose, omeprazole 20 mg each morning . Follow up regarding foot pain.  ? Reports foot paint improved, but still planning to schedule follow up appointment with Podiatrist. Provide phone number for Podiatrist ? Reports only occasionally needing tramadol for pain, taking 1 tablet (50 mg) at bedtime as needed ? Caution  for risk of sedation with tramadol . Discuss importance of CBG control and monitoring o Reports patient taking:  [name] 38 untis daily  Trulicity 1.59 mg once weekly  Jardiance 10 mg once daily before breakfast o Patient reports AM CBG today: 138 ? Unable to provide further CBG readings now, but denies any recent low blood sugars ? Encourage continuing to monitor and keep log of CBGs  . Discuss importance of continuing home monitoring of BP and daily weights, bringing these records with him to medical appointments and follow up with Cardiology for readings outside of established parameters   Patient Self Care Activities:  . Check blood sugar, blood pressure and daily weight as directed by providers  . Patient takes medications, with assistance of wife, as directed o Mrs. Mercer manages patient's medications using weekly pillbox as adherence tool . Patient attends scheduled medical appointments o [facility]ointment [facility] on [facility]18 o Note patient next due for 3 month follow up around 6/23 with lab work 1 week prior, with  PCP  Please see past updates related to this goal by clicking on the "Past Updates" button in the selected goal         Plan  Telephone follow up appointment with care management team member scheduled for: 5/7 at 10:30 am  [name], PharmD, [place] [phone]

## 2019-07-09 NOTE — Patient Instructions (Signed)
Thank you allowing the Chronic Care Management Team to be a part of your care! It was a pleasure speaking with you today!     CCM (Chronic Care Management) Team    Noreene Larsson RN, MSN, CCM Nurse Care Coordinator  [phone]   [name] PharmD  Clinical Pharmacist  [phone]   Eula Fried LCSW Clinical Social Worker [phone]  Visit Information  Goals Addressed            This Visit's Progress   . PharmD - Medication Managment       CARE PLAN ENTRY (see longitudinal plan of care for additional care plan information)  Current Barriers:  . Financial o Note patient has Partial Extra Help/LIS Subsidy . Limited vision . Chronic Disease Management support and education needs related to multiple chronic disease processes  Pharmacist Clinical Goal(s):  [name] Over the next 30 days, patient will work with CM Pharmacist to address needs related to medication adherence and diabetes management  Interventions: . Performed chart review ? Patient seen by Cardiologist on 4/16 ? Update on plan for Brilinta: patient "to discontinue ticagrelor when he has exhausted his current supply at the end of this month.  At that point, he should begin taking aspirin 81 mg daily in addition to his long-term anticoagulation." ? Based on follow up lab work, patient advised today to start losartan 25 mg daily with repeat BMP in 1-2 weeks ? Patient seen by Pulmonologist on 4/22, patient instructed to: ? Obtain home sleep test ? Obtain 6MWT ? Continue Oxygen at night . Follow up with Mrs. Cueva regarding home sleep test and 6MWT. Reports instructed will receive call from [facility] Pulmonology office regarding scheduling . Comprehensive medication review performed; medication list updated in electronic medical record ? From chart review note Spiriva inhaler previously discontinued by Pulmonologist 02/17/19. Note Spiriva not included on med list from visit with Pulmonologist yesterday and  patient denies using ? Mrs. Harker reports patient will run out of Brilinta tomorrow. Confirms once out of Brilinta, will begin taking aspirin 81 mg daily in addition to his long-term anticoagulation (warfarin) as directed by Cardiologist ? Reports patient's GERD symptoms controlled with reduced PPI dose, omeprazole 20 mg each morning . Follow up regarding foot pain.  ? Reports foot paint improved, but still planning to schedule follow up appointment with Podiatrist. Provide phone number for Podiatrist ? Reports only occasionally needing tramadol for pain, taking 1 tablet (50 mg) at bedtime as needed ? Caution for risk of sedation with tramadol . Discuss importance of CBG control and monitoring o Reports patient taking:  [name] 38 untis daily  Trulicity 1.66 mg once weekly  Jardiance 10 mg once daily before breakfast o Patient reports AM CBG today: 138 ? Unable to provide further CBG readings now, but denies any recent low blood sugars ? Encourage continuing to monitor and keep log of CBGs  . Discuss importance of continuing home monitoring of BP and daily weights, bringing these records with him to medical appointments and follow up with Cardiology for readings outside of established parameters   Patient Self Care Activities:  . Check blood sugar, blood pressure and daily weight as directed by providers  . Patient takes medications, with assistance of wife, as directed o Mrs. Gracey manages patient's medications using weekly pillbox as adherence tool . Patient attends scheduled medical appointments o [facility]ointment with [facility] on [facility]18 o Note patient next due for 3 month follow up around 6/23 with  lab work 1 week prior, with PCP  Please see past updates related to this goal by clicking on the "Past Updates" button in the selected goal         Patient verbalizes understanding of instructions provided today.   Telephone follow up appointment with care  management team member scheduled for: 5/7 at 10:30 am  [name], PharmD, [place] [phone]

## 2019-07-12 ENCOUNTER — Other Ambulatory Visit: Payer: Self-pay

## 2019-07-12 ENCOUNTER — Encounter: Payer: Medicare HMO | Admitting: *Deleted

## 2019-07-12 DIAGNOSIS — I5042 Chronic combined systolic (congestive) and diastolic (congestive) heart failure: Secondary | ICD-10-CM | POA: Diagnosis not present

## 2019-07-12 DIAGNOSIS — N400 Enlarged prostate without lower urinary tract symptoms: Secondary | ICD-10-CM | POA: Diagnosis not present

## 2019-07-12 DIAGNOSIS — I110 Hypertensive heart disease with heart failure: Secondary | ICD-10-CM | POA: Diagnosis not present

## 2019-07-12 DIAGNOSIS — Z7982 Long term (current) use of aspirin: Secondary | ICD-10-CM | POA: Diagnosis not present

## 2019-07-12 DIAGNOSIS — Z87891 Personal history of nicotine dependence: Secondary | ICD-10-CM | POA: Diagnosis not present

## 2019-07-12 DIAGNOSIS — E119 Type 2 diabetes mellitus without complications: Secondary | ICD-10-CM | POA: Diagnosis not present

## 2019-07-12 DIAGNOSIS — Z79899 Other long term (current) drug therapy: Secondary | ICD-10-CM | POA: Diagnosis not present

## 2019-07-12 DIAGNOSIS — Z7901 Long term (current) use of anticoagulants: Secondary | ICD-10-CM | POA: Diagnosis not present

## 2019-07-12 DIAGNOSIS — Z794 Long term (current) use of insulin: Secondary | ICD-10-CM | POA: Diagnosis not present

## 2019-07-12 NOTE — Progress Notes (Signed)
Daily Session Note  Patient Details  Name: Lucas Caldwell MRN: [id number] Date of Birth: 1934/01/21 Referring Provider:     Cardiac Rehab from 05/20/2019 in [facility] Cardiac and Pulmonary Rehab  Referring Provider  Bensimhon      Encounter Date: 07/12/2019  Check In: Session Check In - 07/12/19 1355      Check-In   Supervising physician immediately available to respond to emergencies  See telemetry face sheet for immediately available ER MD    Location  [facility]-Cardiac & Pulmonary Rehab    Staff Present  Heath Lark, RN, BSN, Laveda Norman, BS, ACSM CEP, Exercise Physiologist;Joseph Tessie Fass RCP,RRT,BSRT    Virtual Visit  No    Medication changes reported      No    Fall or balance concerns reported     No    Warm-up and Cool-down  Performed on first and last piece of equipment    Resistance Training Performed  Yes    VAD Patient?  No    PAD/SET Patient?  No      Pain Assessment   Currently in Pain?  No/denies          Social History   Tobacco Use  Smoking Status Former Smoker  . Packs/day: 1.75  . Years: 20.00  . Pack years: 35.00  . Types: Cigarettes  . Start date: 27  . Quit date: 39  . Years since quitting: 46.3  Smokeless Tobacco Former Systems developer    Goals Met:  Independence with exercise equipment Exercise tolerated well No report of cardiac concerns or symptoms  Goals Unmet:  Not Applicable  Comments: Pt able to follow exercise prescription today without complaint.  Will continue to monitor for progression.    Dr. Emily Filbert is Medical Director for [name].

## 2019-07-14 ENCOUNTER — Other Ambulatory Visit: Payer: Self-pay

## 2019-07-14 ENCOUNTER — Encounter: Payer: Medicare HMO | Admitting: *Deleted

## 2019-07-14 DIAGNOSIS — Z7901 Long term (current) use of anticoagulants: Secondary | ICD-10-CM | POA: Diagnosis not present

## 2019-07-14 DIAGNOSIS — I5042 Chronic combined systolic (congestive) and diastolic (congestive) heart failure: Secondary | ICD-10-CM

## 2019-07-14 DIAGNOSIS — N400 Enlarged prostate without lower urinary tract symptoms: Secondary | ICD-10-CM | POA: Diagnosis not present

## 2019-07-14 DIAGNOSIS — I5022 Chronic systolic (congestive) heart failure: Secondary | ICD-10-CM

## 2019-07-14 DIAGNOSIS — Z7982 Long term (current) use of aspirin: Secondary | ICD-10-CM | POA: Diagnosis not present

## 2019-07-14 DIAGNOSIS — E119 Type 2 diabetes mellitus without complications: Secondary | ICD-10-CM | POA: Diagnosis not present

## 2019-07-14 DIAGNOSIS — Z87891 Personal history of nicotine dependence: Secondary | ICD-10-CM | POA: Diagnosis not present

## 2019-07-14 DIAGNOSIS — Z794 Long term (current) use of insulin: Secondary | ICD-10-CM | POA: Diagnosis not present

## 2019-07-14 DIAGNOSIS — I110 Hypertensive heart disease with heart failure: Secondary | ICD-10-CM | POA: Diagnosis not present

## 2019-07-14 DIAGNOSIS — Z79899 Other long term (current) drug therapy: Secondary | ICD-10-CM | POA: Diagnosis not present

## 2019-07-14 NOTE — Progress Notes (Signed)
Daily Session Note  Patient Details  Name: Lucas Caldwell MRN: [id number] Date of Birth: 23-Nov-1933 Referring Provider:     Cardiac Rehab from 05/20/2019 in [facility] Cardiac and Pulmonary Rehab  Referring Provider  Bensimhon      Encounter Date: 07/14/2019  Check In: Session Check In - 07/14/19 1340      Check-In   Supervising physician immediately available to respond to emergencies  See telemetry face sheet for immediately available ER MD    Location  [facility]-Cardiac & Pulmonary Rehab    Staff Present  Renita Papa, RN BSN;Jessica Sylva, MA, RCEP, CCRP, CCET;Melissa [place] RDN, Rowe Pavy, [place], ACSM CEP, Exercise Physiologist    Virtual Visit  No    Medication changes reported      No    Fall or balance concerns reported     No    Warm-up and Cool-down  Performed on first and last piece of equipment    Resistance Training Performed  Yes    VAD Patient?  No    PAD/SET Patient?  No      Pain Assessment   Currently in Pain?  No/denies          Social History   Tobacco Use  Smoking Status Former Smoker  . Packs/day: 1.75  . Years: 20.00  . Pack years: 35.00  . Types: Cigarettes  . Start date: 81  . Quit date: 46  . Years since quitting: 46.3  Smokeless Tobacco Former Systems developer    Goals Met:  Independence with exercise equipment Exercise tolerated well No report of cardiac concerns or symptoms Strength training completed today  Goals Unmet:  Not Applicable  Comments: Pt able to follow exercise prescription today without complaint.  Will continue to monitor for progression.    Dr. Emily Filbert is Medical Director for [place] [name].

## 2019-07-15 ENCOUNTER — Ambulatory Visit (HOSPITAL_COMMUNITY)
Admission: RE | Admit: 2019-07-15 | Discharge: 2019-07-15 | Disposition: A | Discharge status: Home / Self Care | Payer: Medicare HMO | Source: Ambulatory Visit | Attending: Cardiology | Admitting: Cardiology

## 2019-07-15 ENCOUNTER — Encounter (HOSPITAL_COMMUNITY): Payer: Self-pay

## 2019-07-15 ENCOUNTER — Other Ambulatory Visit: Payer: Self-pay

## 2019-07-15 VITALS — BP 144/92 | HR 71 | Wt 201.0 lb

## 2019-07-15 DIAGNOSIS — N400 Enlarged prostate without lower urinary tract symptoms: Secondary | ICD-10-CM | POA: Diagnosis not present

## 2019-07-15 DIAGNOSIS — Z881 Allergy status to other antibiotic agents status: Secondary | ICD-10-CM | POA: Diagnosis not present

## 2019-07-15 DIAGNOSIS — M1A39X0 Chronic gout due to renal impairment, multiple sites, without tophus (tophi): Secondary | ICD-10-CM

## 2019-07-15 DIAGNOSIS — I2510 Atherosclerotic heart disease of native coronary artery without angina pectoris: Secondary | ICD-10-CM | POA: Diagnosis not present

## 2019-07-15 DIAGNOSIS — R0683 Snoring: Secondary | ICD-10-CM | POA: Insufficient documentation

## 2019-07-15 DIAGNOSIS — Z89411 Acquired absence of right great toe: Secondary | ICD-10-CM

## 2019-07-15 DIAGNOSIS — Z7901 Long term (current) use of anticoagulants: Secondary | ICD-10-CM | POA: Diagnosis not present

## 2019-07-15 DIAGNOSIS — I5042 Chronic combined systolic (congestive) and diastolic (congestive) heart failure: Secondary | ICD-10-CM | POA: Diagnosis not present

## 2019-07-15 DIAGNOSIS — I255 Ischemic cardiomyopathy: Secondary | ICD-10-CM | POA: Diagnosis not present

## 2019-07-15 DIAGNOSIS — I130 Hypertensive heart and chronic kidney disease with heart failure and stage 1 through stage 4 chronic kidney disease, or unspecified chronic kidney disease: Secondary | ICD-10-CM | POA: Diagnosis not present

## 2019-07-15 DIAGNOSIS — I428 Other cardiomyopathies: Secondary | ICD-10-CM | POA: Insufficient documentation

## 2019-07-15 DIAGNOSIS — H5461 Unqualified visual loss, right eye, normal vision left eye: Secondary | ICD-10-CM | POA: Diagnosis not present

## 2019-07-15 DIAGNOSIS — Z833 Family history of diabetes mellitus: Secondary | ICD-10-CM | POA: Diagnosis not present

## 2019-07-15 DIAGNOSIS — E039 Hypothyroidism, unspecified: Secondary | ICD-10-CM | POA: Diagnosis not present

## 2019-07-15 DIAGNOSIS — M109 Gout, unspecified: Secondary | ICD-10-CM | POA: Insufficient documentation

## 2019-07-15 DIAGNOSIS — Z8249 Family history of ischemic heart disease and other diseases of the circulatory system: Secondary | ICD-10-CM | POA: Diagnosis not present

## 2019-07-15 DIAGNOSIS — Z794 Long term (current) use of insulin: Secondary | ICD-10-CM | POA: Insufficient documentation

## 2019-07-15 DIAGNOSIS — N1831 Chronic kidney disease, stage 3a: Secondary | ICD-10-CM | POA: Diagnosis not present

## 2019-07-15 DIAGNOSIS — E1122 Type 2 diabetes mellitus with diabetic chronic kidney disease: Secondary | ICD-10-CM | POA: Diagnosis not present

## 2019-07-15 DIAGNOSIS — K219 Gastro-esophageal reflux disease without esophagitis: Secondary | ICD-10-CM | POA: Insufficient documentation

## 2019-07-15 DIAGNOSIS — Z8546 Personal history of malignant neoplasm of prostate: Secondary | ICD-10-CM | POA: Insufficient documentation

## 2019-07-15 DIAGNOSIS — Z79899 Other long term (current) drug therapy: Secondary | ICD-10-CM | POA: Insufficient documentation

## 2019-07-15 DIAGNOSIS — I453 Trifascicular block: Secondary | ICD-10-CM | POA: Insufficient documentation

## 2019-07-15 DIAGNOSIS — E785 Hyperlipidemia, unspecified: Secondary | ICD-10-CM | POA: Diagnosis not present

## 2019-07-15 DIAGNOSIS — Z87891 Personal history of nicotine dependence: Secondary | ICD-10-CM | POA: Diagnosis not present

## 2019-07-15 DIAGNOSIS — I480 Paroxysmal atrial fibrillation: Secondary | ICD-10-CM

## 2019-07-15 DIAGNOSIS — Z955 Presence of coronary angioplasty implant and graft: Secondary | ICD-10-CM | POA: Insufficient documentation

## 2019-07-15 LAB — CBC
HCT: 37.8 % — ABNORMAL LOW (ref 39.0–52.0)
Hemoglobin: 11.9 g/dL — ABNORMAL LOW (ref 13.0–17.0)
MCH: 28.1 pg (ref 26.0–34.0)
MCHC: 31.5 g/dL (ref 30.0–36.0)
MCV: 89.4 fL (ref 80.0–100.0)
Platelets: 237 10*3/uL (ref 150–400)
RBC: 4.23 MIL/uL (ref 4.22–5.81)
RDW: 14.7 % (ref 11.5–15.5)
WBC: 8.5 10*3/uL (ref 4.0–10.5)
nRBC: 0 % (ref 0.0–0.2)

## 2019-07-15 LAB — BASIC METABOLIC PANEL
Anion gap: 9 (ref 5–15)
BUN: 14 mg/dL (ref 8–23)
CO2: 27 mmol/L (ref 22–32)
Calcium: 9 mg/dL (ref 8.9–10.3)
Chloride: 101 mmol/L (ref 98–111)
Creatinine, Ser: 1.44 mg/dL — ABNORMAL HIGH (ref 0.61–1.24)
GFR calc Af Amer: 52 mL/min — ABNORMAL LOW (ref >60)
GFR calc non Af Amer: 45 mL/min — ABNORMAL LOW (ref >60)
Glucose, Bld: 107 mg/dL — ABNORMAL HIGH (ref 70–99)
Potassium: 3.9 mmol/L (ref 3.5–5.1)
Sodium: 137 mmol/L (ref 135–145)

## 2019-07-15 LAB — BRAIN NATRIURETIC PEPTIDE: B Natriuretic Peptide: 81.8 pg/mL (ref 0.0–100.0)

## 2019-07-15 MED ORDER — POTASSIUM CHLORIDE CRYS ER 20 MEQ PO TBCR: 20.0000 meq | EXTENDED_RELEASE_TABLET | Freq: Every day | ORAL | 3 refills | Status: DC

## 2019-07-15 MED ORDER — WARFARIN SODIUM 3 MG PO TABS: 3.0000 mg | ORAL_TABLET | ORAL | 1 refills | Status: DC

## 2019-07-15 MED ORDER — ALLOPURINOL 100 MG PO TABS: 100.0000 mg | ORAL_TABLET | Freq: Every day | ORAL | 3 refills | Status: DC

## 2019-07-15 MED ORDER — TICAGRELOR 90 MG PO TABS: 90.0000 mg | ORAL_TABLET | Freq: Two times a day (BID) | ORAL | 6 refills | Status: DC

## 2019-07-15 NOTE — Progress Notes (Signed)
ADVANCED HF CLINIC PROGRESS NOTE  Primary Cardiologist: Dr. Saunders Revel AHF: [name]   HPI: Lucas Caldwell is a 86 y.o. male with history of coronary artery disease,chronic systolic heart failure due to likelymixed ischemic and nonischemic cardiomyopathy, hypertension, hyperlipidemia, type 2 diabetes mellitus, paroxysmal atrial fibrillation, chronic kidney disease stage III, CML, iron deficiency anemia,GI bleed due to duodenal ulcers,BPH, and hypothyroidism who is referred by Dr. Saunders Revel for further evaluation of his HF.    Has h/o NICM previously followed by Dr. Carolynn Serve at [facility]. He had good response to medical therapy in 2012 EF was reported to be 50%   Had PCI of his RCA CAD s/p RCA stent x2 08/2016 at [facility].    Switched his care to Dr. Saunders Revel in 2019. Recently struggling with increasing DOE.   Echo 08/2018 LVEF 35-40%, indeterminite LV diastolic filling pressures, tricuspid AV, mildly dilated pulmonary artery, no significant valvular disfunction.   Cardiac cath 08/2018 70% LM stenosis with abnl FFR of 0.86, though drop in iFR was due to diffuse prox LAD disease and only small gradient involving ostial LM. Imdur was increased.   Repeat Cath 1/21 LM 60% LAD 70% with +FFR otherwise diffuse non-obstructive CAD -> PCI/DES LAD 1/21. RHC: RA 10 RV 53/12 PA 53/30 (40) PCWP 28 CO/CI 4.2/2.1 PA sat 67%.  Echo 04/22/19 EF 30-35%  Referred to [facility] 04/2019 for further management of his systolic HF. He has been on goal directed medical therapy, including metoprolol succinate (dose limited by bifascicular block), hydralazine, and isosorbide mononitrate.  He has had problems with renal insufficiency in the past, limiting ACEI/ARB use in the past, though decision was recently made to rechallenge him given worsening HF and declining LVEF. At f/u visit w/ Dr. Saunders Revel earlier this month, Losartan 25 mg was added.   Today in f/u, he reports that he continues to struggle w/ impaired exercise tolerance. [name]  frustrated that he can't get around as well as he would like. [name] SOB with most of his ADLs but denies resting dyspnea. No orthopnea/PND. Was recently evaluated by pulmonology. He tells me had imaging done and was told his lungs were "ok.  He denies CP. Volume status ok on exam. ReDs Clip 35%. BP today is 144/92 but he has not yet taken his AM meds.   He also reports frequent intermittent palpitations along w/ dyspnea. RRR on exam today. Had EKG at Dr. Darnelle Bos office on 4/16 showing NSR. He does have a h/o afib. [name] on coumadin and reports full compliance. Denies abnormal bleeding.    Review of Systems: [y] = yes, '[ ]'  = no   General: Weight gain Blue.Reese ]; Weight loss '[ ]' ; Anorexia '[ ]' ; Fatigue '[ ]' ; Fever '[ ]' ; Chills '[ ]' ; Weakness '[ ]'   Cardiac: Chest pain/pressure '[ ]' ; Resting SOB Blue.Reese ]; Exertional SOB [ y]; Orthopnea '[ ]' ; Pedal Edema '[ ]' ; Palpitations '[ ]' ; Syncope '[ ]' ; Presyncope '[ ]' ; Paroxysmal nocturnal dyspnea'[ ]'   Pulmonary: Cough '[ ]' ; Wheezing'[ ]' ; Hemoptysis'[ ]' ; Sputum '[ ]' ; Snoring '[ ]'   GI: Vomiting'[ ]' ; Dysphagia'[ ]' ; Melena'[ ]' ; Hematochezia '[ ]' ; Heartburn'[ ]' ; Abdominal pain '[ ]' ; Constipation '[ ]' ; Diarrhea '[ ]' ; BRBPR '[ ]'   GU: Hematuria'[ ]' ; Dysuria '[ ]' ; Nocturia'[ ]'   Vascular: Pain in legs with walking '[ ]' ; Pain in feet with lying flat '[ ]' ; Non-healing sores '[ ]' ; Stroke '[ ]' ; TIA '[ ]' ; Slurred speech '[ ]' ;  Neuro: Headaches'[ ]' ; Vertigo'[ ]' ; Seizures'[ ]' ;  Paresthesias'[ ]' ;Blurred vision '[ ]' ; Diplopia '[ ]' ; Vision changes '[ ]'   Ortho/Skin: Arthritis Blue.Reese ]; Joint pain Blue.Reese ]; Muscle pain '[ ]' ; Joint swelling '[ ]' ; Back Pain '[ ]' ; Rash '[ ]'   Psych: Depression'[ ]' ; Anxiety'[ ]'   Heme: Bleeding problems '[ ]' ; Clotting disorders '[ ]' ; Anemia '[ ]'   Endocrine: Diabetes [ y]; Thyroid dysfunction'[ ]'    Past Medical History:  Diagnosis Date  . Arthritis    "probably in his legs" (08/15/2016)  . Blind right eye   . BPH (benign prostatic hyperplasia)   . Breast asymmetry    Left breast is larger, present for several years.  [name]  CAD (coronary artery disease)    a. Cath in the late 90's - reportedly ok;  b. 2014 s/p stenting x 2 @ [facility]; c 08/19/16 Cath/PCI with DES -> RCA, plan to treat LM 70% medically. Seen by surgery and felt to be too high risk for CABG; d. 08/2018 Cath: LM 70 (iFR 0.86), LAD 20ost, 40p, 50/48m D1 70, LCX patent stent, OM1 30, RCA patent stent, 457mSR, 40d, RPDA 30. PCWP 8. CO/CI 4.0/2.1.  . Marland Kitchenhronic combined systolic (congestive) and diastolic (congestive) heart failure (HCWoodinville   a. Previously reduced EF-->50% by echo in 2012;  b. 06/2015 Echo: EF 50-55%  c. 07/2016 Echo: EF 45-50%; d. 12/2017 Echo: EF 55%, Gr1 DD, mild MR; e. 08/2018 Echo: EF 35-40%, mildly dil PA.  . Marland Kitchenhronic kidney disease (CKD), stage IV (severe) (HCPahr[place]  . CML (chronic myelocytic leukemia) (HCCrest[place]  . GERD (gastroesophageal reflux disease)   . GIB (gastrointestinal bleeding)    a. 12/2017 3 unit PRBC GIB in setting of coumadin-->Endo/colon multiple duodenal ulcers and a single bleeding ulcer in the proximal ascending colon status post hemostatic clipping x2.  . Gout   . Hyperlipemia   . Hypertension   . Hypothyroidism   . Iron deficiency anemia   . Ischemic cardiomyopathy    a. Previously reduced EF-->50% by echo in 2012;  b. 06/2015 Echo: EF 50-55%, Gr2 DD, mild MR, mildly dil LA, Ao sclerosis, mild TR;  c. 07/2016 Echo: EF 45-50%; d. 12/2017 Echo: EF 55%, Gr1 DD, mild MR; e. 08/2018 Echo: EF 35-40%.  . Migraine    "in the 1960s" (08/15/2016)  . PAF (paroxysmal atrial fibrillation) (HCC)    a. ? Dx 2014-->s/p DCCV;  b. CHA2DS2VASc = 6--> Coumadin.  . Prostate cancer (HCFairview-Fern[place]  . RBBB   . Type II diabetes mellitus (HCGoree    Current Outpatient Medications  Medication Sig Dispense Refill  . allopurinol (ZYLOPRIM) 100 MG tablet TAKE ONE TABLET BY MOUTH ONCE DAILY 90 tablet 3  . bosutinib (BOSULIF) 100 MG tablet Take 100 mg by mouth at bedtime as needed (tired/not feeling well). Take with food.     . empagliflozin (JARDIANCE) 10 MG TABS  tablet Take 10 mg by mouth daily before breakfast. 90 tablet 3  . gabapentin (NEURONTIN) 300 MG capsule Take 2 capsules (600 mg total) by mouth 2 (two) times daily. Max dose = 120026maily for kidney function 360 capsule 1  . hydrALAZINE (APRESOLINE) 50 MG tablet Take 1 tablet (50 mg total) by mouth 3 (three) times daily. 270 tablet 2  . isosorbide mononitrate (IMDUR) 60 MG 24 hr tablet Take 1.5 tablets (90 mg total) by mouth daily. 135 tablet 2  . losartan (COZAAR) 25 MG tablet Take 1 tablet (25 mg total) by mouth daily. 90 tablet 1  . metoprolol succinate (  TOPROL-XL) 25 MG 24 hr tablet Take 1 tablet (25 mg total) by mouth daily. 90 tablet 1  . Multiple Vitamins-Minerals (MULTIVITAMIN ADULTS 50+ PO) Take 1 tablet by mouth every morning.    [name] omeprazole (PRILOSEC) 20 MG capsule Take 20 mg by mouth daily.     . potassium chloride SA (KLOR-CON) 20 MEQ tablet Take 1 tablet (20 mEq total) by mouth daily. Take 3 tablets the first day only. 90 tablet 3  . ranolazine (RANEXA) 500 MG 12 hr tablet Take 1 tablet (500 mg total) by mouth 2 (two) times daily. 180 tablet 2  . rosuvastatin (CRESTOR) 5 MG tablet Take 1 tablet (5 mg total) by mouth daily. 90 tablet 3  . ticagrelor (BRILINTA) 90 MG TABS tablet Take 1 tablet (90 mg total) by mouth 2 (two) times daily. 60 tablet 6  . torsemide (DEMADEX) 20 MG tablet Take 3 tablets (60 mg total) by mouth 2 (two) times daily. 180 tablet 2  . traMADol (ULTRAM) 50 MG tablet Take 1-2 tablets (50-100 mg total) by mouth at bedtime as needed. 30 tablet 2  . warfarin (COUMADIN) 3 MG tablet Take 1 tablet (3 mg total) by mouth as directed. 45 tablet 1  . ACCU-CHEK AVIVA PLUS test strip Use to check blood sugar up to 2 times daily 200 each 3  . Accu-Chek Softclix Lancets lancets Use to check blood sugar up to 2 times daily. 200 each 3  . acetaminophen (TYLENOL) 500 MG tablet Take 1,000 mg by mouth every 6 (six) hours as needed for moderate pain or headache.     . Alcohol Swabs 70  % PADS Use when checking blood sugar up to 3x daily 100 each 11  . Blood Glucose Monitoring Suppl (ACCU-CHEK AVIVA PLUS) w/Device KIT Use to check blood sugar up to 2 times daily 1 kit 0  . Dulaglutide (TRULICITY) 1.00 FH/2.1FX SOPN Inject 0.75 mg into the skin once a week. (Patient taking differently: Inject 0.75 mg into the skin every Saturday. ) 4 pen 0  . Insulin Glargine ([name] KWIKPEN) 100 UNIT/ML SOPN Inject 38 Units into the skin daily.     . Insulin Pen Needle 31G X 5 MM MISC 31 g by Does not apply route as directed. 100 each 5  . levothyroxine (SYNTHROID) 25 MCG tablet TAKE 1 TABLET (25 MCG TOTAL) BY MOUTH DAILY BEFORE BREAKFAST. 90 tablet 1  . Menthol-Zinc Oxide (GOLD BOND EX) Apply 1 application topically daily as needed (muscle pain).    . nitroGLYCERIN (NITROSTAT) 0.4 MG SL tablet Place 1 tablet (0.4 mg total) under the tongue every 5 (five) minutes x 3 doses as needed for chest pain. 25 tablet 4  . Polyethyl Glycol-Propyl Glycol (SYSTANE OP) Place 1 drop into both eyes daily as needed (dry eyes).    . polyethylene glycol (MIRALAX / GLYCOLAX) 17 g packet Take 17 g by mouth daily as needed for mild constipation.     [name] PROVENTIL HFA 108 (90 Base) MCG/ACT inhaler Inhale 2 puffs into the lungs every 6 (six) hours as needed for wheezing or shortness of breath.     No current facility-administered medications for this encounter.    Allergies  Allergen Reactions  . Clopidogrel Anaphylaxis    altered mental status;  (electrolytes "out of wack" and confusion per family; THEY DO NOT REMEMBER ANY OTHER REACTION)- MSB 10/10/15  . Ciprofloxacin Itching  . Mirtazapine Diarrhea    Bad dreams   . Spironolactone     gynecomastia  Social History   Socioeconomic History  . Marital status: Married    Spouse name: Not on file  . Number of children: Not on file  . Years of education: Not on file  . Highest education level: 7th grade  Occupational History  . 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  Tobacco Use  . Smoking status: Former Smoker    Packs/day: 1.75    Years: 20.00    Pack years: 35.00    Types: Cigarettes    Start date: 1955    Quit date: 1975    Years since quitting: 46.3  . Smokeless tobacco: Former Network engineer and Sexual Activity  . Alcohol use: No    Comment: previously drank heavily - quit 1979.  . Drug use: No  . Sexual activity: Never  Other Topics Concern  . Not on file  Social History Narrative   Working on transportation truck part time    Social Determinants of Radio broadcast assistant Strain: Low Risk   . Difficulty of Paying Living Expenses: Not very hard  Food Insecurity: No Food Insecurity  . Worried About Charity fundraiser in the Last Year: Never true  . Ran Out of Food in the Last Year: Never true  Transportation Needs: No Transportation Needs  . Lack of Transportation (Medical): No  . Lack of Transportation (Non-Medical): No  Physical Activity: Inactive  . Days of Exercise per Week: 0 days  . Minutes of Exercise per Session: 0 min  Stress: No Stress Concern Present  . Feeling of Stress : Not at all  Social Connections: Slightly Isolated  . Frequency of Communication with Friends and Family: More than three times a week  . Frequency of Social Gatherings with Friends and Family: More than three times a week  . Attends Religious Services: More than 4 times per year  . Active Member of Clubs or Organizations: No  . Attends Archivist Meetings: Never  . Marital Status: Married  Human resources officer Violence: Not At Risk  . Fear of Current or Ex-Partner: No  . Emotionally Abused: No  . Physically Abused: No  . Sexually Abused: No      Family History  Problem Relation Age of Onset  . Brain cancer Father   . Diabetes Sister   . Heart attack Sister     Vitals:   07/15/19 0932  BP: (!) 144/92  Pulse: 71  SpO2: 99%  Weight: 91.2 kg (201 lb)   PHYSICAL EXAM: ReDs Clip  35% General:  Well appearing elderly AAM. No respiratory difficulty HEENT: normal Neck: supple. no JVD. Carotids 2+ bilat; no bruits. No lymphadenopathy or thyromegaly appreciated. Cor: PMI nondisplaced. Regular rate & rhythm. No rubs, gallops or murmurs. Lungs: clear Abdomen: soft, nontender, nondistended. No hepatosplenomegaly. No bruits or masses. Good bowel sounds. Extremities: no cyanosis, clubbing, rash, edema Neuro: alert & oriented x 3, cranial nerves grossly intact. moves all 4 extremities w/o difficulty. Affect pleasant.    ASSESSMENT & PLAN:  1. Chronic systolic HF: - likely combination of iCM/NICM - EF 30-35% echo 2/21 (was 35-40% in 6/20) - Remains NYHA III. Frustrated w/ his functional status. Desires to be able to do more - Recent RHC 04/13/19 with elevated filling pressures but BNP normal  - Volume status ok on exam today and ReDs clip reading is 35% - He has had frequent intermittent palpitations along w/ dyspnea. ? PAF vs ventricular arrhythmias, will obtain 2  week Zio monitor - I am hesitant to further titrate HF meds given he has not yet taken any of his morning meds. On top of this, he has had some recent dizziness. Continue current regimen for now. He was advised to take all of his AM meds prior to his next f/u so that we can try to titrate regimen - Continue hydral 50 tid and imdur 90 daily - Continue Toprol 25 daily (dose reduced due to trifascicular block) - Continue Losartan 25 mg daily  - Continue torsemide 60 tid  - Continue Farxiga 10  - Check BMP today. If SCr stable. Consider adding digoxin at next visit - Reinforced need for daily weights and reviewed use of sliding scale diuretics.  - May consider cardiomems device if he has any further issues keeping volume stable  - Continue to optimize meds and plan repeat echo in ~3 months.  If EF doesn't recover, will need to weight pros/cons of ICD  2. CAD - cath 04/13/19 LAD 70% with +FFR otherwise diffuse  non-obstructive CAD  - PCI/DES LAD 1/21 - followed by Dr. Saunders Revel - denies anginal symptoms - currently on triple therapy w/ plans to stop Brilinta soon (per Dr. Saunders Revel, 3 months post PCI). He will continue on ASA and coumadin thereafter. He denies bleeding. Will check CBC.    3. CKD Stage 3a - check BMP today   4. DM  - continue Farxiga 10 mg daily   5. PAF - RRR on exam. Reviewed recent EKG showing NSR - Endorses frequent palpitations. Order 2 week Zio Patch - Home sleep study has been ordered to r/o OSA  - Continue warfarin for a/c  6. Snoring - will need sleep study. This has been ordered. Study pending   Lyda Jester, PA-C  9:45 AM

## 2019-07-15 NOTE — Patient Instructions (Addendum)
It was great to see you today! No medication changes are needed at this time.  Labs today We will only contact you if something comes back abnormal or we need to make some changes. Otherwise no news is good news!  Your provider has recommended that  you wear a Zio Patch for 14 days.  This monitor will record your heart rhythm for our review.  IF you have any symptoms while wearing the monitor please press the button.  If you have any issues with the patch or you notice a red or orange light on it please call the company at [phone].  Once you remove the patch please mail it back to the company as soon as possible so we can get the results.  Your physician recommends that you schedule a follow-up appointment in: 3 weeks with  in the Advanced Practitioners (PA/NP) Clinic  Next appointment: Thursday May 20th, 2021 at [place]: 5008    Please call office at [phone] option [phone] if you have any questions or concerns.    Do the following things EVERYDAY: 1) Weigh yourself in the morning before breakfast. Write it down and keep it in a log. 2) Take your medicines as prescribed 3) Eat low salt foods--Limit salt (sodium) to 2000 mg per day.  4) Stay as active as you can everyday 5) Limit all fluids for the day to less than 2 liters  At the [facility], you and your health needs are our priority. As part of our continuing mission to provide you with exceptional heart care, we have created designated Provider Care Teams. These Care Teams include your primary Cardiologist (physician) and Advanced Practice Providers (APPs- Physician Assistants and Nurse Practitioners) who all work together to provide you with the care you need, when you need it.   You may see any of the following providers on your designated Care Team at your next follow up: [name] Dr Glori Bickers . Dr Loralie Champagne . Darrick Grinder, NP . Lyda Jester, PA . Audry Riles, PharmD   Please be sure  to bring in all your medications bottles to every appointment.

## 2019-07-20 ENCOUNTER — Telehealth: Payer: Self-pay | Admitting: Internal Medicine

## 2019-07-20 MED ORDER — TORSEMIDE 20 MG PO TABS: 60.0000 mg | ORAL_TABLET | Freq: Two times a day (BID) | ORAL | 2 refills | Status: DC

## 2019-07-20 NOTE — Telephone Encounter (Signed)
Please call to discuss Torsemide. Patient is not sure if she should continue to keep taking

## 2019-07-20 NOTE — Telephone Encounter (Signed)
Spoke with patient's wife. Patient needs refill of Torsemide and knows he should continue taking it. Refill sent to pharmacy.

## 2019-07-21 DIAGNOSIS — E1142 Type 2 diabetes mellitus with diabetic polyneuropathy: Secondary | ICD-10-CM | POA: Diagnosis not present

## 2019-07-21 DIAGNOSIS — B351 Tinea unguium: Secondary | ICD-10-CM | POA: Diagnosis not present

## 2019-07-21 DIAGNOSIS — L97511 Non-pressure chronic ulcer of other part of right foot limited to breakdown of skin: Secondary | ICD-10-CM | POA: Diagnosis not present

## 2019-07-21 DIAGNOSIS — E11621 Type 2 diabetes mellitus with foot ulcer: Secondary | ICD-10-CM | POA: Diagnosis not present

## 2019-07-23 ENCOUNTER — Telehealth: Payer: Medicare HMO

## 2019-07-23 ENCOUNTER — Ambulatory Visit: Payer: Self-pay | Admitting: Pharmacist

## 2019-07-23 NOTE — Chronic Care Management (AMB) (Signed)
  Chronic Care Management   Follow Up Note   07/23/2019 Name: Lucas Caldwell MRN: [id number] DOB: 07-20-1933  Referred by: [name], DO Reason for referral : Chronic Care Management (Patient Phone Call)   Lucas Caldwell is a 86 y.o. year old male who is a primary care patient of [name], DO. The CCM team was consulted for assistance with chronic disease management and care coordination needs.    Was unable to reach patient via telephone today and have left HIPAA compliant voicemail asking patient to return my call.    Plan  The care management team will reach out to the patient again over the next 30 days.   [name], PharmD, [place] [employer] [phone]

## 2019-07-26 ENCOUNTER — Encounter: Payer: Medicare HMO | Attending: Internal Medicine

## 2019-07-26 DIAGNOSIS — N400 Enlarged prostate without lower urinary tract symptoms: Secondary | ICD-10-CM | POA: Insufficient documentation

## 2019-07-26 DIAGNOSIS — I110 Hypertensive heart disease with heart failure: Secondary | ICD-10-CM | POA: Insufficient documentation

## 2019-07-26 DIAGNOSIS — Z794 Long term (current) use of insulin: Secondary | ICD-10-CM | POA: Insufficient documentation

## 2019-07-26 DIAGNOSIS — E785 Hyperlipidemia, unspecified: Secondary | ICD-10-CM | POA: Insufficient documentation

## 2019-07-26 DIAGNOSIS — E119 Type 2 diabetes mellitus without complications: Secondary | ICD-10-CM | POA: Insufficient documentation

## 2019-07-26 DIAGNOSIS — K219 Gastro-esophageal reflux disease without esophagitis: Secondary | ICD-10-CM | POA: Insufficient documentation

## 2019-07-26 DIAGNOSIS — E1142 Type 2 diabetes mellitus with diabetic polyneuropathy: Secondary | ICD-10-CM | POA: Diagnosis not present

## 2019-07-26 DIAGNOSIS — E039 Hypothyroidism, unspecified: Secondary | ICD-10-CM | POA: Insufficient documentation

## 2019-07-26 DIAGNOSIS — I5042 Chronic combined systolic (congestive) and diastolic (congestive) heart failure: Secondary | ICD-10-CM | POA: Insufficient documentation

## 2019-07-26 DIAGNOSIS — I480 Paroxysmal atrial fibrillation: Secondary | ICD-10-CM | POA: Insufficient documentation

## 2019-07-26 DIAGNOSIS — E11621 Type 2 diabetes mellitus with foot ulcer: Secondary | ICD-10-CM | POA: Diagnosis not present

## 2019-07-26 DIAGNOSIS — Z7901 Long term (current) use of anticoagulants: Secondary | ICD-10-CM | POA: Insufficient documentation

## 2019-07-26 DIAGNOSIS — Z79899 Other long term (current) drug therapy: Secondary | ICD-10-CM | POA: Insufficient documentation

## 2019-07-26 DIAGNOSIS — Z87891 Personal history of nicotine dependence: Secondary | ICD-10-CM | POA: Insufficient documentation

## 2019-07-26 DIAGNOSIS — Z7982 Long term (current) use of aspirin: Secondary | ICD-10-CM | POA: Insufficient documentation

## 2019-07-26 DIAGNOSIS — L97511 Non-pressure chronic ulcer of other part of right foot limited to breakdown of skin: Secondary | ICD-10-CM | POA: Diagnosis not present

## 2019-07-27 ENCOUNTER — Telehealth: Payer: Self-pay

## 2019-07-27 NOTE — Telephone Encounter (Signed)
Called to check in on patient as he has recently had his toe amputated and is wearing a boot. His wife mentioned he was supposed to see his doctor Monday. LM

## 2019-07-28 ENCOUNTER — Ambulatory Visit: Payer: Medicare HMO

## 2019-07-31 DIAGNOSIS — I5022 Chronic systolic (congestive) heart failure: Secondary | ICD-10-CM | POA: Diagnosis not present

## 2019-08-02 ENCOUNTER — Other Ambulatory Visit: Payer: Self-pay

## 2019-08-02 ENCOUNTER — Encounter: Payer: Medicare HMO | Admitting: *Deleted

## 2019-08-02 ENCOUNTER — Ambulatory Visit (INDEPENDENT_AMBULATORY_CARE_PROVIDER_SITE_OTHER): Payer: Medicare HMO

## 2019-08-02 DIAGNOSIS — I480 Paroxysmal atrial fibrillation: Secondary | ICD-10-CM | POA: Diagnosis not present

## 2019-08-02 DIAGNOSIS — I5040 Unspecified combined systolic (congestive) and diastolic (congestive) heart failure: Secondary | ICD-10-CM | POA: Diagnosis present

## 2019-08-02 DIAGNOSIS — I5042 Chronic combined systolic (congestive) and diastolic (congestive) heart failure: Secondary | ICD-10-CM | POA: Diagnosis not present

## 2019-08-02 DIAGNOSIS — K219 Gastro-esophageal reflux disease without esophagitis: Secondary | ICD-10-CM | POA: Diagnosis not present

## 2019-08-02 DIAGNOSIS — Z87891 Personal history of nicotine dependence: Secondary | ICD-10-CM | POA: Diagnosis not present

## 2019-08-02 DIAGNOSIS — E039 Hypothyroidism, unspecified: Secondary | ICD-10-CM | POA: Diagnosis not present

## 2019-08-02 DIAGNOSIS — Z7901 Long term (current) use of anticoagulants: Secondary | ICD-10-CM | POA: Diagnosis not present

## 2019-08-02 DIAGNOSIS — E785 Hyperlipidemia, unspecified: Secondary | ICD-10-CM | POA: Diagnosis not present

## 2019-08-02 DIAGNOSIS — N400 Enlarged prostate without lower urinary tract symptoms: Secondary | ICD-10-CM | POA: Diagnosis not present

## 2019-08-02 DIAGNOSIS — Z79899 Other long term (current) drug therapy: Secondary | ICD-10-CM | POA: Diagnosis not present

## 2019-08-02 DIAGNOSIS — E119 Type 2 diabetes mellitus without complications: Secondary | ICD-10-CM | POA: Diagnosis not present

## 2019-08-02 DIAGNOSIS — Z7982 Long term (current) use of aspirin: Secondary | ICD-10-CM | POA: Diagnosis not present

## 2019-08-02 DIAGNOSIS — Z794 Long term (current) use of insulin: Secondary | ICD-10-CM | POA: Diagnosis not present

## 2019-08-02 DIAGNOSIS — I110 Hypertensive heart disease with heart failure: Secondary | ICD-10-CM | POA: Diagnosis not present

## 2019-08-02 LAB — POCT INR: INR: 1.9 — AB (ref 2.0–3.0)

## 2019-08-02 NOTE — Progress Notes (Signed)
Daily Session Note  Patient Details  Name: Lucas Caldwell MRN: [id number] Date of Birth: Aug 08, 1933 Referring Provider:     Cardiac Rehab from 05/20/2019 in [facility]  Referring Provider  Bensimhon      Encounter Date: 08/02/2019  Check In: Session Check In - 08/02/19 1340      Check-In   Supervising physician immediately available to respond to emergencies  See telemetry face sheet for immediately available ER MD    Location  [facility]    Staff Present  Renita Papa, RN BSN;Joseph [address] [place], [place], ACSM CEP, Exercise Physiologist    Virtual Visit  No    Medication changes reported      No    Fall or balance concerns reported     No    Warm-up and Cool-down  Performed on first and last piece of equipment    Resistance Training Performed  Yes    VAD Patient?  No    PAD/SET Patient?  No      Pain Assessment   Currently in Pain?  No/denies          Social History   Tobacco Use  Smoking Status Former Smoker  . Packs/day: 1.75  . Years: 20.00  . Pack years: 35.00  . Types: Cigarettes  . Start date: 69  . Quit date: 46  . Years since quitting: 46.4  Smokeless Tobacco Former Systems developer    Goals Met:  Independence with exercise equipment Exercise tolerated well No report of cardiac concerns or symptoms Strength training completed today  Goals Unmet:  Not Applicable  Comments: Pt able to follow exercise prescription today without complaint.  Will continue to monitor for progression.    Dr. Emily Filbert is Medical Director for [name].

## 2019-08-02 NOTE — Patient Instructions (Signed)
-   take 2 whole tablets tonight - continue dosage warfarin of 1 TABLET EVERY DAY EXCEPT 1.5 TABLETS ON MONDAYS & FRIDAYS. - recheck INR in 4 weeks.

## 2019-08-03 ENCOUNTER — Encounter (HOSPITAL_COMMUNITY): Payer: Medicare HMO

## 2019-08-03 ENCOUNTER — Other Ambulatory Visit: Payer: Self-pay | Admitting: Internal Medicine

## 2019-08-03 MED ORDER — WARFARIN SODIUM 3 MG PO TABS: 3.0000 mg | ORAL_TABLET | ORAL | 0 refills | Status: DC

## 2019-08-03 NOTE — Telephone Encounter (Signed)
*  STAT* If patient is at the pharmacy, call can be transferred to refill team.   1. Which medications need to be refilled? (please list name of each medication and dose if known)   Warfarin 3 mg po as directed   2. Which pharmacy/location (including street and city if local pharmacy) is medication to be sent to?  [place]    3. Do they need a 30 day or 90 day supply? [place]

## 2019-08-04 ENCOUNTER — Encounter: Payer: Self-pay | Admitting: *Deleted

## 2019-08-04 DIAGNOSIS — I5042 Chronic combined systolic (congestive) and diastolic (congestive) heart failure: Secondary | ICD-10-CM

## 2019-08-04 NOTE — Progress Notes (Signed)
Cardiac Individual Treatment Plan  Patient Details  Name: Lucas Caldwell MRN: [id number] Date of Birth: 09-Oct-1933 Referring Provider:     Cardiac Rehab from 05/20/2019 in [facility] Cardiac and Pulmonary Rehab  Referring Provider  Bensimhon      Initial Encounter Date:    Cardiac Rehab from 05/20/2019 in [facility] Cardiac and Pulmonary Rehab  Date  05/20/19      Visit Diagnosis: Chronic combined systolic and diastolic congestive heart failure ([employer])  Patient's Home Medications on Admission:  Current Outpatient Medications:  .  ACCU-CHEK AVIVA PLUS test strip, Use to check blood sugar up to 2 times daily, Disp: 200 each, Rfl: 3 .  Accu-Chek Softclix Lancets lancets, Use to check blood sugar up to 2 times daily., Disp: 200 each, Rfl: 3 .  acetaminophen (TYLENOL) 500 MG tablet, Take 1,000 mg by mouth every 6 (six) hours as needed for moderate pain or headache. , Disp: , Rfl:  .  Alcohol Swabs 70 % PADS, Use when checking blood sugar up to 3x daily, Disp: 100 each, Rfl: 11 .  allopurinol (ZYLOPRIM) 100 MG tablet, Take 1 tablet (100 mg total) by mouth daily., Disp: 90 tablet, Rfl: 3 .  Blood Glucose Monitoring Suppl (ACCU-CHEK AVIVA PLUS) w/Device KIT, Use to check blood sugar up to 2 times daily, Disp: 1 kit, Rfl: 0 .  bosutinib (BOSULIF) 100 MG tablet, Take 100 mg by mouth at bedtime as needed (tired/not feeling well). Take with food. , Disp: , Rfl:  .  Dulaglutide (TRULICITY) 4.76 LY/6.5KP SOPN, Inject 0.75 mg into the skin once a week. (Patient taking differently: Inject 0.75 mg into the skin every Saturday. ), Disp: 4 pen, Rfl: 0 .  empagliflozin (JARDIANCE) 10 MG TABS tablet, Take 10 mg by mouth daily before breakfast., Disp: 90 tablet, Rfl: 3 .  gabapentin (NEURONTIN) 300 MG capsule, Take 2 capsules (600 mg total) by mouth 2 (two) times daily. Max dose = 1216m daily for kidney function, Disp: 360 capsule, Rfl: 1 .  hydrALAZINE (APRESOLINE) 50 MG tablet, Take 1 tablet (50 mg total) by mouth 3  (three) times daily., Disp: 270 tablet, Rfl: 2 .  Insulin Glargine ([name] KWIKPEN) 100 UNIT/ML SOPN, Inject 38 Units into the skin daily. , Disp: , Rfl:  .  Insulin Pen Needle 31G X 5 MM MISC, 31 g by Does not apply route as directed., Disp: 100 each, Rfl: 5 .  isosorbide mononitrate (IMDUR) 60 MG 24 hr tablet, Take 1.5 tablets (90 mg total) by mouth daily., Disp: 135 tablet, Rfl: 2 .  levothyroxine (SYNTHROID) 25 MCG tablet, TAKE 1 TABLET (25 MCG TOTAL) BY MOUTH DAILY BEFORE BREAKFAST., Disp: 90 tablet, Rfl: 1 .  losartan (COZAAR) 25 MG tablet, Take 1 tablet (25 mg total) by mouth daily., Disp: 90 tablet, Rfl: 1 .  Menthol-Zinc Oxide (GOLD BOND EX), Apply 1 application topically daily as needed (muscle pain)., Disp: , Rfl:  .  metoprolol succinate (TOPROL-XL) 25 MG 24 hr tablet, Take 1 tablet (25 mg total) by mouth daily., Disp: 90 tablet, Rfl: 1 .  Multiple Vitamins-Minerals (MULTIVITAMIN ADULTS 50+ PO), Take 1 tablet by mouth every morning., Disp: , Rfl:  .  nitroGLYCERIN (NITROSTAT) 0.4 MG SL tablet, Place 1 tablet (0.4 mg total) under the tongue every 5 (five) minutes x 3 doses as needed for chest pain., Disp: 25 tablet, Rfl: 4 .  omeprazole (PRILOSEC) 20 MG capsule, Take 20 mg by mouth daily. , Disp: , Rfl:  .  Polyethyl Glycol-Propyl  Glycol (SYSTANE OP), Place 1 drop into both eyes daily as needed (dry eyes)., Disp: , Rfl:  .  polyethylene glycol (MIRALAX / GLYCOLAX) 17 g packet, Take 17 g by mouth daily as needed for mild constipation. , Disp: , Rfl:  .  potassium chloride SA (KLOR-CON) 20 MEQ tablet, Take 1 tablet (20 mEq total) by mouth daily., Disp: 90 tablet, Rfl: 3 .  PROVENTIL HFA 108 (90 Base) MCG/ACT inhaler, Inhale 2 puffs into the lungs every 6 (six) hours as needed for wheezing or shortness of breath., Disp: , Rfl:  .  ranolazine (RANEXA) 500 MG 12 hr tablet, Take 1 tablet (500 mg total) by mouth 2 (two) times daily., Disp: 180 tablet, Rfl: 2 .  rosuvastatin (CRESTOR) 5 MG  tablet, Take 1 tablet (5 mg total) by mouth daily., Disp: 90 tablet, Rfl: 3 .  ticagrelor (BRILINTA) 90 MG TABS tablet, Take 1 tablet (90 mg total) by mouth 2 (two) times daily., Disp: 60 tablet, Rfl: 6 .  torsemide (DEMADEX) 20 MG tablet, Take 3 tablets (60 mg total) by mouth 2 (two) times daily., Disp: 540 tablet, Rfl: 2 .  traMADol (ULTRAM) 50 MG tablet, Take 1-2 tablets (50-100 mg total) by mouth at bedtime as needed., Disp: 30 tablet, Rfl: 2 .  warfarin (COUMADIN) 3 MG tablet, Take 1 tablet (3 mg total) by mouth as directed., Disp: 120 tablet, Rfl: 0  Past Medical History: Past Medical History:  Diagnosis Date  . Arthritis    "probably in his legs" (08/15/2016)  . Blind right eye   . BPH (benign prostatic hyperplasia)   . Breast asymmetry    Left breast is larger, present for several years.  [name] CAD (coronary artery disease)    a. Cath in the late 90's - reportedly ok;  b. 2014 s/p stenting x 2 @ [facility]; c 08/19/16 Cath/PCI with DES -> RCA, plan to treat LM 70% medically. Seen by surgery and felt to be too high risk for CABG; d. 08/2018 Cath: LM 70 (iFR 0.86), LAD 20ost, 40p, 50/52m D1 70, LCX patent stent, OM1 30, RCA patent stent, 462mSR, 40d, RPDA 30. PCWP 8. CO/CI 4.0/2.1.  . Marland Kitchenhronic combined systolic (congestive) and diastolic (congestive) heart failure ([place] [place]   a. Previously reduced EF-->50% by echo in 2012;  b. 06/2015 Echo: EF 50-55%  c. 07/2016 Echo: EF 45-50%; d. 12/2017 Echo: EF 55%, Gr1 DD, mild MR; e. 08/2018 Echo: EF 35-40%, mildly dil PA.  . Marland Kitchenhronic kidney disease (CKD), stage IV (severe) ([place]  . CML (chronic myelocytic leukemia) (HCJackson  . GERD (gastroesophageal reflux disease)   . GIB (gastrointestinal bleeding)    a. 12/2017 3 unit PRBC GIB in setting of coumadin-->Endo/colon multiple duodenal ulcers and a single bleeding ulcer in the proximal ascending colon status post hemostatic clipping x2.  . Gout   . Hyperlipemia   . Hypertension   . Hypothyroidism   . Iron deficiency  anemia   . Ischemic cardiomyopathy    a. Previously reduced EF-->50% by echo in 2012;  b. 06/2015 Echo: EF 50-55%, Gr2 DD, mild MR, mildly dil LA, Ao sclerosis, mild TR;  c. 07/2016 Echo: EF 45-50%; d. 12/2017 Echo: EF 55%, Gr1 DD, mild MR; e. 08/2018 Echo: EF 35-40%.  . Migraine    "in the 1960s" (08/15/2016)  . PAF (paroxysmal atrial fibrillation) (HCC)    a. ? Dx 2014-->s/p DCCV;  b. CHA2DS2VASc = 6--> Coumadin.  . Prostate cancer ([place]  .  RBBB   . Type II diabetes mellitus ([place])     Tobacco Use: Social History   Tobacco Use  Smoking Status Former Smoker  . Packs/day: 1.75  . Years: 20.00  . Pack years: 35.00  . Types: Cigarettes  . Start date: 3  . Quit date: 76  . 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   Exercise Prescription Goal: Initial exercise prescription builds to 30-45 minutes a day of aerobic activity, 2-3 days per week.  Home exercise guidelines will be given to patient during program as part of exercise prescription that the participant will acknowledge.   Education: Aerobic Exercise & Resistance Training: - Gives group verbal and written instruction on the various components of exercise. Focuses on aerobic and resistive training programs and the benefits of this training and how to safely progress through these programs..   Education: Exercise & Equipment Safety: - Individual  verbal instruction and demonstration of equipment use and safety with use of the equipment.   Cardiac Rehab from 05/20/2019 in [name] Cardiac and Pulmonary Rehab  Date  05/20/19  Educator  AS  Instruction Review Code  1- Verbalizes Understanding      Education: Exercise Physiology & General Exercise Guidelines: - Group verbal and written instruction with models to review the exercise physiology of the cardiovascular system and associated critical values. Provides general exercise guidelines with specific guidelines to those with heart or lung disease.    Education: Flexibility, Balance, Mind/Body Relaxation: Provides group verbal/written instruction on the benefits of flexibility and balance training, including mind/body exercise modes such as yoga, pilates and tai chi.  Demonstration and skill practice provided.   Activity Barriers & Risk Stratification: Activity Barriers & Cardiac Risk Stratification - 05/19/19 0913      Activity Barriers & Cardiac Risk Stratification   Activity Barriers  Arthritis;Joint Problems;Deconditioning;Muscular Weakness;Shortness of Breath;Assistive Device   knee pain, uses walker   Cardiac Risk Stratification  High       6 Minute Walk: 6 Minute Walk    Row Name 05/20/19 1539         6 Minute Walk   Distance  715 feet     Walk Time  5.35 minutes     # of Rest Breaks  1     MPH  1.54     METS  1.14     RPE  15     Perceived Dyspnea   2     VO2 Peak  4     Symptoms  Yes (comment)     Comments  legs "going to sleep" from knees down     Resting HR  95 bpm     Resting BP  114/70     Resting Oxygen Saturation   98 %  Exercise Oxygen Saturation  during 6 min walk  92 %     Max Ex. HR  107 bpm     Max Ex. BP  154/68     2 Minute Post BP  124/68        Oxygen Initial Assessment:   Oxygen Re-Evaluation:   Oxygen Discharge (Final Oxygen Re-Evaluation):   Initial Exercise Prescription: Initial Exercise Prescription - 05/20/19 1500       Date of Initial Exercise RX and Referring Provider   Date  05/20/19    Referring Provider  Bensimhon      Treadmill   MPH  1    Grade  0    Minutes  15      REL-XR   Level  1    Speed  50    Minutes  15    METs  1      Prescription Details   Frequency (times per week)  3    Duration  Progress to 30 minutes of continuous aerobic without signs/symptoms of physical distress      Intensity   THRR 40-80% of Max Heartrate  111-128    Ratings of Perceived Exertion  11-13    Perceived Dyspnea  0-4      Resistance Training   Training Prescription  Yes    Weight  3 lb    Reps  10-15       Perform Capillary Blood Glucose checks as needed.  Exercise Prescription Changes: Exercise Prescription Changes    Row Name 05/20/19 1500 06/09/19 1100 06/22/19 1500 07/07/19 1100 07/20/19 1500     Response to Exercise   Blood Pressure (Admit)  114/70  110/70  140/82  130/64  126/64   Blood Pressure (Exercise)  154/68  120/80  130/70  140/72  128/74   Blood Pressure (Exit)  124/68  122/82  124/58  124/62  114/68   Heart Rate (Admit)  95 bpm  56 bpm  87 bpm  95 bpm  94 bpm   Heart Rate (Exercise)  107 bpm  126 bpm  116 bpm  127 bpm  116 bpm   Heart Rate (Exit)  90 bpm  109 bpm  95 bpm  111 bpm  97 bpm   Oxygen Saturation (Admit)  98 %  --  --  --  --   Oxygen Saturation (Exercise)  92 %  --  --  --  --   Oxygen Saturation (Exit)  96 %  --  --  --  --   Rating of Perceived Exertion (Exercise)  '15  11  12  12  12   ' Perceived Dyspnea (Exercise)  2  --  --  --  --   Symptoms  legs "going to sleep"  none  none  none  none   Comments  L shoulder old injury  second full day of exericse  --  --  --   Duration  --  Progress to 30 minutes of  aerobic without signs/symptoms of physical distress  Progress to 30 minutes of  aerobic without signs/symptoms of physical distress  Progress to 30 minutes of  aerobic without signs/symptoms of physical distress  Progress to 30 minutes of  aerobic without  signs/symptoms of physical distress   Intensity  --  THRR unchanged  THRR unchanged  THRR unchanged  THRR unchanged     Progression   Progression  --  Continue to progress workloads to maintain intensity without signs/symptoms of physical distress.  Continue to progress workloads to maintain intensity without signs/symptoms of physical distress.  Continue to progress workloads to maintain intensity without signs/symptoms of physical distress.  Continue to progress workloads to maintain intensity without signs/symptoms of physical distress.   Average METs  --  2  1.3  2.49  2.17     Resistance Training   Training Prescription  --  Yes  Yes  Yes  Yes   Weight  --  3 lb   3 lb   3 lb  4 lb   Reps  --  10-15  10-15  10-15  10-15     Interval Training   Interval Training  --  No  No  No  No     Treadmill   MPH  --  1.3  --  1.4  1.4   Grade  --  0  --  0.5  0.5   Minutes  --  15  --  15  15   METs  --  2  --  2.17  2.17     REL-XR   Level  --  '1  1  1  1   ' Speed  --  --  50  --  50   Minutes  --  '15  15  15  15   ' METs  --  2  1.3  2.2  --     T5 Nustep   Level  --  --  --  2  --   Minutes  --  --  --  15  --   METs  --  --  --  3.1  --      Exercise Comments: Exercise Comments    Row Name 06/02/19 1353           Exercise Comments  First full day of exercise!  Patient was oriented to gym and equipment including functions, settings, policies, and procedures.  Patient's individual exercise prescription and treatment plan were reviewed.  All starting workloads were established based on the results of the 6 minute walk test done at initial orientation visit.  The plan for exercise progression was also introduced and progression will be customized based on patient's performance and goals.          Exercise Goals and Review: Exercise Goals    Row Name 05/20/19 1544             Exercise Goals   Increase Physical Activity  Yes       Intervention  Provide advice, education,  support and counseling about physical activity/exercise needs.;Develop an individualized exercise prescription for aerobic and resistive training based on initial evaluation findings, risk stratification, comorbidities and participant's personal goals.       Expected Outcomes  Short Term: Attend rehab on a regular basis to increase amount of physical activity.;Long Term: Add in home exercise to make exercise part of routine and to increase amount of physical activity.;Long Term: Exercising regularly at least 3-5 days a week.       Increase Strength and Stamina  Yes       Intervention  Provide advice, education, support and counseling about physical activity/exercise needs.;Develop an individualized exercise prescription for aerobic and resistive training based on initial evaluation findings, risk stratification, comorbidities and participant's personal goals.       Expected Outcomes  Short Term: Increase workloads from initial exercise prescription for resistance, speed, and METs.;Short Term: Perform resistance training exercises routinely during rehab and add in resistance  training at home;Long Term: Improve cardiorespiratory fitness, muscular endurance and strength as measured by increased METs and functional capacity (6MWT)       Able to understand and use rate of perceived exertion (RPE) scale  Yes       Intervention  Provide education and explanation on how to use RPE scale       Expected Outcomes  Short Term: Able to use RPE daily in rehab to express subjective intensity level;Long Term:  Able to use RPE to guide intensity level when exercising independently       Able to understand and use Dyspnea scale  Yes       Intervention  Provide education and explanation on how to use Dyspnea scale       Expected Outcomes  Short Term: Able to use Dyspnea scale daily in rehab to express subjective sense of shortness of breath during exertion;Long Term: Able to use Dyspnea scale to guide intensity level when  exercising independently       Knowledge and understanding of Target Heart Rate Range (THRR)  Yes       Intervention  Provide education and explanation of THRR including how the numbers were predicted and where they are located for reference       Expected Outcomes  Short Term: Able to state/look up THRR;Short Term: Able to use daily as guideline for intensity in rehab;Long Term: Able to use THRR to govern intensity when exercising independently       Able to check pulse independently  Yes       Intervention  Provide education and demonstration on how to check pulse in carotid and radial arteries.;Review the importance of being able to check your own pulse for safety during independent exercise       Expected Outcomes  Short Term: Able to explain why pulse checking is important during independent exercise;Long Term: Able to check pulse independently and accurately       Understanding of Exercise Prescription  Yes       Intervention  Provide education, explanation, and written materials on patient's individual exercise prescription       Expected Outcomes  Short Term: Able to explain program exercise prescription;Long Term: Able to explain home exercise prescription to exercise independently          Exercise Goals Re-Evaluation : Exercise Goals Re-Evaluation    Row Name 06/02/19 1353 06/09/19 1109 06/22/19 1553 06/30/19 1343 07/07/19 1131     Exercise Goal Re-Evaluation   Exercise Goals Review  Able to understand and use rate of perceived exertion (RPE) scale;Understanding of Exercise Prescription;Knowledge and understanding of Target Heart Rate Range (THRR)  Increase Physical Activity;Increase Strength and Stamina;Understanding of Exercise Prescription  Increase Physical Activity;Increase Strength and Stamina;Able to understand and use rate of perceived exertion (RPE) scale;Able to understand and use Dyspnea scale;Knowledge and understanding of Target Heart Rate Range (THRR);Able to check pulse  independently;Understanding of Exercise Prescription  Increase Physical Activity;Increase Strength and Stamina;Able to understand and use rate of perceived exertion (RPE) scale;Able to understand and use Dyspnea scale;Knowledge and understanding of Target Heart Rate Range (THRR);Able to check pulse independently;Understanding of Exercise Prescription  Increase Physical Activity;Increase Strength and Stamina;Understanding of Exercise Prescription   Comments  Reviewed RPE scale, THR and program prescription with pt today.  Pt voiced understanding and was given a copy of goals to take home.  Usman is off to a good start in rehab.  He has completed his first two full days of  exericse.  [name] now up to 1.3 mph on the treadmill.  We will continue to monitor his progress.  Treydon only did the XR today.  He did not feel up to the TM.  Staff will monitor progress  Yostin is now on the treadmill again. Ganim reports walking at home for exercise; 1 mile 3x/week (RPE12).  Jaan is doing well in rehab.  [name] now up to 3.1 METs on the NuStep.  We will continue to monitor his progress.   Expected Outcomes  Short: Use RPE daily to regulate intensity. Long: Follow program prescription in THR.  Short: Continue to attend rehab regularly  Long: Continue to follow program prescription.  Short: attend consistently Long: improve overall stamina  Short: attend consistently, add some resistence at home Long: improve overall stamina  Short: Continue to improve walking on treadmill  Long; Continue to improve stamina.   [place] Name 07/20/19 1536 08/02/19 1542           Exercise Goal Re-Evaluation   Exercise Goals Review  Increase Physical Activity;Increase Strength and Stamina;Able to understand and use rate of perceived exertion (RPE) scale;Able to understand and use Dyspnea scale;Knowledge and understanding of Target Heart Rate Range (THRR);Able to check pulse independently;Understanding of Exercise Prescription  Increase  Physical Activity;Increase Strength and Stamina;Understanding of Exercise Prescription      Comments  Chaka works in [employer] range when he attends.  he has increased to 4 lb weights for strength work  Gwyndolyn Saxon returned to rehab today after missing since 4/28.  He had been out after debriment of a diabetic ulcer.  He did well today on the equipment and particiapated in weights as well.  We will continue to monitor his progress.      Expected Outcomes  Short: continue to attend consistently Long:  improve stamina and MET level  Short: Return to improved attendance Long: Continue to improve stamina.         Discharge Exercise Prescription (Final Exercise Prescription Changes): Exercise Prescription Changes - 07/20/19 1500      Response to Exercise   Blood Pressure (Admit)  126/64    Blood Pressure (Exercise)  128/74    Blood Pressure (Exit)  114/68    Heart Rate (Admit)  94 bpm    Heart Rate (Exercise)  116 bpm    Heart Rate (Exit)  97 bpm    Rating of Perceived Exertion (Exercise)  12    Symptoms  none    Duration  Progress to 30 minutes of  aerobic without signs/symptoms of physical distress    Intensity  THRR unchanged      Progression   Progression  Continue to progress workloads to maintain intensity without signs/symptoms of physical distress.    Average METs  2.17      Resistance Training   Training Prescription  Yes    Weight  4 lb    Reps  10-15      Interval Training   Interval Training  No      Treadmill   MPH  1.4    Grade  0.5    Minutes  15    METs  2.17      REL-XR   Level  1    Speed  50    Minutes  15       Nutrition:  Target Goals: Understanding of nutrition guidelines, daily intake of sodium <1584m, cholesterol <2052m calories 30% from fat and 7% or less from saturated fats, daily  to have 5 or more servings of fruits and vegetables.  Education: Controlling Sodium/Reading Food Labels -Group verbal and written material supporting the discussion of sodium  use in heart healthy nutrition. Review and explanation with models, verbal and written materials for utilization of the food label.   Education: General Nutrition Guidelines/Fats and Fiber: -Group instruction provided by verbal, written material, models and posters to present the general guidelines for heart healthy nutrition. Gives an explanation and review of dietary fats and fiber.   Biometrics: Pre Biometrics - 05/20/19 1545      Pre Biometrics   Height  '5\' 7"'  (1.702 m)    Weight  206 lb (93.4 kg)    BMI (Calculated)  32.26        Nutrition Therapy Plan and Nutrition Goals: Nutrition Therapy & Goals - 05/24/19 0904      Nutrition Therapy   Diet  HH, Low Na, DM    Drug/Food Interactions  Coumadin/Vit K    Protein (specify units)  80-85g    Fiber  30 grams    Whole Grain Foods  3 servings    Saturated Fats  12 max. grams    Fruits and Vegetables  5 servings/day    Sodium  1.5 grams      Personal Nutrition Goals   Nutrition Goal  ST: have one protein food at each meal. Small frequent meals. LT: improving SOB and DOE. 4/10    Comments  Pt reports needing help. SOB with eating. Not much appetite. eggs and grits in am. turnip greens and green beans. Canned peaches. Discussed protein sources with wife and pt. Discussed how right now the focus is on protein and calorie needs as pt is not eating due to SOB, but discussed some HH and DM options as well. Wife goignt o try BBG Chicken. Pt reports not eating many foods high in protein. Fried chicken legs last night with some peaches, BG this morning was 188, usually around 130-150. BG stable throught the day. Discussed HH, DM friendly eating. Discussed protein and calorie needs and how to meet them with SOB such as small frequent meals. Hard for pt to hear, will talk more during class time in person.      Intervention Plan   Intervention  Prescribe, educate and counsel regarding individualized specific dietary modifications aiming towards  targeted core components such as weight, hypertension, lipid management, diabetes, heart failure and other comorbidities.;Nutrition handout(s) given to patient.    Expected Outcomes  Short Term Goal: Understand basic principles of dietary content, such as calories, fat, sodium, cholesterol and nutrients.;Short Term Goal: A plan has been developed with personal nutrition goals set during dietitian appointment.;Long Term Goal: Adherence to prescribed nutrition plan.       Nutrition Assessments: Nutrition Assessments - 05/20/19 1652      MEDFICTS Scores   Pre Score  46       MEDIFICTS Score Key:          ?70 Need to make dietary changes          40-70 Heart Healthy Diet         ? 40 Therapeutic Level Cholesterol Diet  Nutrition Goals Re-Evaluation: Nutrition Goals Re-Evaluation    [place] Name 06/10/19 1348             Goals   Nutrition Goal  ST: have one protein food at each meal. Small frequent meals. LT: improving SOB and DOE. 4/10       Comment  Pt's wife [name] will make all of his meals for him. Wife reports making a protein  food at each meal, mostly chicken. clarified what foods are good sources of protein, wife voiced understanding. Reiterated importance of getting enough protein intake to pt and small frequent meals for his SOB. Pt reports still having SOB. Pt and wife did not recall initial RD consult, will continue to follow up.       Expected Outcome  ST: have one protein food at each meal. Small frequent meals. LT: improving SOB and DOE. 4/10          Nutrition Goals Discharge (Final Nutrition Goals Re-Evaluation): Nutrition Goals Re-Evaluation - 06/10/19 1348      Goals   Nutrition Goal  ST: have one protein food at each meal. Small frequent meals. LT: improving SOB and DOE. 4/10    Comment  Pt's wife [name] will make all of his meals for him. Wife reports making a protein  food at each meal, mostly chicken. clarified what foods are good sources of protein, wife voiced  understanding. Reiterated importance of getting enough protein intake to pt and small frequent meals for his SOB. Pt reports still having SOB. Pt and wife did not recall initial RD consult, will continue to follow up.    Expected Outcome  ST: have one protein food at each meal. Small frequent meals. LT: improving SOB and DOE. 4/10       Psychosocial: Target Goals: Acknowledge presence or absence of significant depression and/or stress, maximize coping skills, provide positive support system. Participant is able to verbalize types and ability to use techniques and skills needed for reducing stress and depression.   Education: Depression - Provides group verbal and written instruction on the correlation between heart/lung disease and depressed mood, treatment options, and the stigmas associated with seeking treatment.   Education: Sleep Hygiene -Provides group verbal and written instruction about how sleep can affect your health.  Define sleep hygiene, discuss sleep cycles and impact of sleep habits. Review good sleep hygiene tips.     Education: Stress and Anxiety: - Provides group verbal and written instruction about the health risks of elevated stress and causes of high stress.  Discuss the correlation between heart/lung disease and anxiety and treatment options. Review healthy ways to manage with stress and anxiety.    Initial Review & Psychosocial Screening: Initial Psych Review & Screening - 05/19/19 0914      Initial Review   Current issues with  Current Stress Concerns    Source of Stress Concerns  Chronic Illness;Unable to participate in former interests or hobbies;Unable to perform yard/household activities    Comments  Wants more energy to do more, less SOB, wearing oxygen and using cane      Family Dynamics   Good Support System?  Yes   wife, son, daughter     Barriers   Psychosocial barriers to participate in program  Psychosocial barriers identified (see note);The  patient should benefit from training in stress management and relaxation.      Screening Interventions   Interventions  Encouraged to exercise;To provide support and resources with identified psychosocial needs;Provide feedback about the scores to participant    Expected Outcomes  Short Term goal: Utilizing psychosocial counselor, staff and physician to assist with identification of specific Stressors or current issues interfering with healing process. Setting desired goal for each stressor or current issue identified.;Long Term Goal: Stressors or current issues are controlled or eliminated.;Short Term goal: Identification and  review with participant of any Quality of Life or Depression concerns found by scoring the questionnaire.;Long Term goal: The participant improves quality of Life and PHQ9 Scores as seen by post scores and/or verbalization of changes       Quality of Life Scores:  Quality of Life - 05/20/19 1545      Quality of Life   Select  Quality of Life      Quality of Life Scores   Health/Function Pre  20.14 %    Socioeconomic Pre  30 %    Psych/Spiritual Pre  30 %    Family Pre  30 %    GLOBAL Pre  25.94 %      Scores of 19 and below usually indicate a poorer quality of life in these areas.  A difference of  2-3 points is a clinically meaningful difference.  A difference of 2-3 points in the total score of the Quality of Life Index has been associated with significant improvement in overall quality of life, self-image, physical symptoms, and general health in studies assessing change in quality of life.  PHQ-9: Recent Review Flowsheet Data    Depression screen [facility] 2/9 05/20/2019 02/02/2019 11/24/2018 11/04/2018 08/24/2018   Decreased Interest 1 0 0 0 0   Down, Depressed, Hopeless 2 0 0 0 0   PHQ - 2 Score 3 0 0 0 0   Altered sleeping 2 0 - - -   Tired, decreased energy 3 0 - - -   Change in appetite 2 0 - - -   Feeling bad or failure about yourself  2 0 - - -   Trouble  concentrating 2 0 - - -   Moving slowly or fidgety/restless 2 0 - - -   Suicidal thoughts 0 0 - - -   PHQ-9 Score 16 0 - - -   Difficult doing work/chores Somewhat difficult - - - -     Interpretation of Total Score  Total Score Depression Severity:  1-4 = Minimal depression, 5-9 = Mild depression, 10-14 = Moderate depression, 15-19 = Moderately severe depression, 20-27 = Severe depression   Psychosocial Evaluation and Intervention: Psychosocial Evaluation - 06/30/19 1345      Psychosocial Evaluation & Interventions   Interventions  Encouraged to exercise with the program and follow exercise prescription;Stress management education    Comments  Loman reports he is less stressed now that he has been coming to class and exercising at home. ADLs are still hard, but they are manageable; they have improved since starting rehab.    Expected Outcomes  Short: Attend rehab regularly to build stamina and improve breating. Long:Improve stamina.    Continue Psychosocial Services   Follow up required by staff       Psychosocial Re-Evaluation:   Psychosocial Discharge (Final Psychosocial Re-Evaluation):   Vocational Rehabilitation: Provide vocational rehab assistance to qualifying candidates.   Vocational Rehab Evaluation & Intervention: Vocational Rehab - 05/19/19 0913      Initial Vocational Rehab Evaluation & Intervention   Assessment shows need for Vocational Rehabilitation  No   retired      Education: Education Goals: Education classes will be provided on a variety of topics geared toward better understanding of heart health and risk factor modification. Participant will state understanding/return demonstration of topics presented as noted by education test scores.  Learning Barriers/Preferences: Learning Barriers/Preferences - 05/19/19 0914      Learning Barriers/Preferences   Learning Barriers  Sight;Hearing   wears glasses, HOH  Learning Preferences  None        General Cardiac Education Topics:  AED/CPR: - Group verbal and written instruction with the use of models to demonstrate the basic use of the AED with the basic ABC's of resuscitation.   Anatomy & Physiology of the Heart: - Group verbal and written instruction and models provide basic cardiac anatomy and physiology, with the coronary electrical and arterial systems. Review of Valvular disease and Heart Failure   Cardiac Procedures: - Group verbal and written instruction to review commonly prescribed medications for heart disease. Reviews the medication, class of the drug, and side effects. Includes the steps to properly store meds and maintain the prescription regimen. (beta blockers and nitrates)   Cardiac Medications I: - Group verbal and written instruction to review commonly prescribed medications for heart disease. Reviews the medication, class of the drug, and side effects. Includes the steps to properly store meds and maintain the prescription regimen.   Cardiac Medications II: -Group verbal and written instruction to review commonly prescribed medications for heart disease. Reviews the medication, class of the drug, and side effects. (all other drug classes)    Go Sex-Intimacy & Heart Disease, Get SMART - Goal Setting: - Group verbal and written instruction through game format to discuss heart disease and the return to sexual intimacy. Provides group verbal and written material to discuss and apply goal setting through the application of the S.M.A.R.T. Method.   Other Matters of the Heart: - Provides group verbal, written materials and models to describe Stable Angina and Peripheral Artery. Includes description of the disease process and treatment options available to the cardiac patient.   Infection Prevention: - Provides verbal and written material to individual with discussion of infection control including proper hand washing and proper equipment cleaning during  exercise session.   Cardiac Rehab from 05/20/2019 in [facility]  Date  05/20/19  Educator  AS  Instruction Review Code  1- Verbalizes Understanding      Falls Prevention: - Provides verbal and written material to individual with discussion of falls prevention and safety.   Cardiac Rehab from 05/20/2019 in [facility] Cardiac [facility] Pulmonary Rehab  Date  05/20/19  Educator  AS  Instruction Review Code  1- Verbalizes Understanding      Other: -Provides group and verbal instruction on various topics (see comments)   Knowledge Questionnaire Score: Knowledge Questionnaire Score - 05/20/19 1546      Knowledge Questionnaire Score   Pre Score  22/26       Core Components/Risk Factors/Patient Goals at Admission: Personal Goals and Risk Factors at Admission - 05/20/19 1547      Core Components/Risk Factors/Patient Goals on Admission    Weight Management  Yes;Weight Loss;Obesity    Intervention  Weight Management: Develop a combined nutrition and exercise program designed to reach desired caloric intake, while maintaining appropriate intake of nutrient and fiber, sodium and fats, and appropriate energy expenditure required for the weight goal.;Weight Management: Provide education and appropriate resources to help participant work on and attain dietary goals.;Weight Management/Obesity: Establish reasonable short term and long term weight goals.;Obesity: Provide education and appropriate resources to help participant work on and attain dietary goals.    Admit Weight  206 lb (93.4 kg)    Goal Weight: Short Term  196 lb (88.9 kg)    Goal Weight: Long Term  186 lb (84.4 kg)    Expected Outcomes  Short Term: Continue to assess and modify interventions until short  term weight is achieved;Long Term: Adherence to nutrition and physical activity/exercise program aimed toward attainment of established weight goal;Weight Loss: Understanding of general recommendations for a balanced deficit  meal plan, which promotes 1-2 lb weight loss per week and includes a negative energy balance of [phone] kcal/d;Understanding recommendations for meals to include 15-35% energy as protein, 25-35% energy from fat, 35-60% energy from carbohydrates, less than 230m of dietary cholesterol, 20-35 gm of total fiber daily;Understanding of distribution of calorie intake throughout the day with the consumption of 4-5 meals/snacks       Education:Diabetes - Individual verbal and written instruction to review signs/symptoms of diabetes, desired ranges of glucose level fasting, after meals and with exercise. Acknowledge that pre and post exercise glucose checks will be done for 3 sessions at entry of program.   Education: Know Your Numbers and Risk Factors: -Group verbal and written instruction about important numbers in your health.  Discussion of what are risk factors and how they play a role in the disease process.  Review of Cholesterol, Blood Pressure, Diabetes, and BMI and the role they play in your overall health.   Core Components/Risk Factors/Patient Goals Review:  Goals and Risk Factor Review    [place] Name 06/30/19 1347             Core Components/Risk Factors/Patient Goals Review   Personal Goals Review  Weight Management/Obesity;Heart Failure;Improve shortness of breath with ADL's;Stress;Hypertension       Review  WDontrezis taking his medications as directed. [name] managing his stress with exercise and his support system. He reports improving with ADLs.       Expected Outcomes  ST: continue taking medications as directed, continue managing stress levels, continue with exercise and nutrition regimen          Core Components/Risk Factors/Patient Goals at Discharge (Final Review):  Goals and Risk Factor Review - 06/30/19 1347      Core Components/Risk Factors/Patient Goals Review   Personal Goals Review  Weight Management/Obesity;Heart Failure;Improve shortness of breath with  ADL's;Stress;Hypertension    Review  WMehulis taking his medications as directed. [name] managing his stress with exercise and his support system. He reports improving with ADLs.    Expected Outcomes  ST: continue taking medications as directed, continue managing stress levels, continue with exercise and nutrition regimen       ITP Comments: ITP Comments    [place] Name 05/19/19 0936 05/24/19 0936 06/02/19 1353 06/09/19 0602 07/07/19 0528   ITP Comments  Completed virtual orientation today.  EP evaluation is scheduled for Thursday 3/4 at 2pm.  [facility]  Pt had oriented on 02/26/19 but never came in for his walk test for various reasons.  Now he is coming in for Cardiac Rehab.  Completed Initial RD Eval  First full day of exercise!  Patient was oriented to gym and equipment including functions, settings, policies, and procedures.  Patient's individual exercise prescription and treatment plan were reviewed.  All starting workloads were established based on the results of the 6 minute walk test done at initial orientation visit.  The plan for exercise progression was also introduced and progression will be customized based on patient's performance and goals.  30 day chart review completed. ITP sent to Dr MZachery DakinsMedical Director, for review,changes as needed and signature. Continue with ITP if no changes requested  New to program  30 Day review completed. Medical Director review done, changes made  as directed,and approval shown by Risk analyst.   [place] Name 08/04/19 0541           ITP Comments  30 Day review completed. ITP review done, changes made as directed,and approval shown by signature of  Scientist, research (life sciences).          Comments:

## 2019-08-04 NOTE — Progress Notes (Signed)
ADVANCED HF CLINIC PROGRESS NOTE  Primary Cardiologist: Dr. Saunders Revel AHF: [name]   HPI: Lucas Caldwell is a 86 y.o. male with history of coronary artery disease,chronic systolic heart failure due to likelymixed ischemic and nonischemic cardiomyopathy, hypertension, hyperlipidemia, type 2 diabetes mellitus, paroxysmal atrial fibrillation, chronic kidney disease stage III, CML, iron deficiency anemia,GI bleed due to duodenal ulcers,BPH, and hypothyroidism who is referred by Dr. Saunders Revel for further evaluation of his HF.    Has h/o NICM previously followed by Dr. Carolynn Serve at [facility]. He had good response to medical therapy in 2012 EF was reported to be 50%   Had PCI of his RCA CAD s/p RCA stent x2 08/2016 at [facility].    Switched his care to Dr. Saunders Revel in 2019. Recently struggling with increasing DOE.   Echo 08/2018 LVEF 35-40%, indeterminite LV diastolic filling pressures, tricuspid AV, mildly dilated pulmonary artery, no significant valvular disfunction.   Cardiac cath 08/2018 70% LM stenosis with abnl FFR of 0.86, though drop in iFR was due to diffuse prox LAD disease and only small gradient involving ostial LM. Imdur was increased.   Admitted 1/26 /21 with chest pain and A/C systolic heart failure. Repeat Cath 1/21 LM 60% LAD 70% with +FFR otherwise diffuse non-obstructive CAD -> PCI/DES LAD 1/21. RHC: RA 10 RV 53/12 PA 53/30 (40) PCWP 28 CO/CI 4.2/2.1 PA sat 67%.  Echo 04/22/19 EF 30-35%  Referred to [facility] 04/2019 for further management of his systolic HF. He has been on goal directed medical therapy, including metoprolol succinate (dose limited by bifascicular block), hydralazine, and isosorbide mononitrate.  He has had problems with renal insufficiency in the past, limiting ACEI/ARB use in the past, though decision was recently made to rechallenge him given worsening HF and declining LVEF. At f/u visit w/ Dr. Saunders Revel earlier this month, Losartan 25 mg was added.   Today he returns for HF follow  up.Overall feeling ok. He remains SOB with exertion. Denies PND/Orthopnea. + Bendopnea. Denies chest pain. Complaining of dizziness when he stands up. Appetite improving. No fever or chills. Weight at home 194-195  pounds. Taking all medications.     Past Medical History:  Diagnosis Date  . Arthritis    "probably in his legs" (08/15/2016)  . Blind right eye   . BPH (benign prostatic hyperplasia)   . Breast asymmetry    Left breast is larger, present for several years.  [name] CAD (coronary artery disease)    a. Cath in the late 90's - reportedly ok;  b. 2014 s/p stenting x 2 @ [facility]; c 08/19/16 Cath/PCI with DES -> RCA, plan to treat LM 70% medically. Seen by surgery and felt to be too high risk for CABG; d. 08/2018 Cath: LM 70 (iFR 0.86), LAD 20ost, 40p, 50/39m D1 70, LCX patent stent, OM1 30, RCA patent stent, 424mSR, 40d, RPDA 30. PCWP 8. CO/CI 4.0/2.1.  . Marland Kitchenhronic combined systolic (congestive) and diastolic (congestive) heart failure (H[facility]   a. Previously reduced EF-->50% by echo in 2012;  b. 06/2015 Echo: EF 50-55%  c. 07/2016 Echo: EF 45-50%; d. 12/2017 Echo: EF 55%, Gr1 DD, mild MR; e. 08/2018 Echo: EF 35-40%, mildly dil PA.  . Marland Kitchenhronic kidney disease (CKD), stage IV (severe) (HCAlvarado  . CML (chronic myelocytic leukemia) (HCLovilia  . GERD (gastroesophageal reflux disease)   . GIB (gastrointestinal bleeding)    a. 12/2017 3 unit PRBC GIB in setting of coumadin-->Endo/colon multiple duodenal ulcers and a single bleeding ulcer  in the proximal ascending colon status post hemostatic clipping x2.  . Gout   . Hyperlipemia   . Hypertension   . Hypothyroidism   . Iron deficiency anemia   . Ischemic cardiomyopathy    a. Previously reduced EF-->50% by echo in 2012;  b. 06/2015 Echo: EF 50-55%, Gr2 DD, mild MR, mildly dil LA, Ao sclerosis, mild TR;  c. 07/2016 Echo: EF 45-50%; d. 12/2017 Echo: EF 55%, Gr1 DD, mild MR; e. 08/2018 Echo: EF 35-40%.  . Migraine    "in the 1960s" (08/15/2016)  . PAF (paroxysmal  atrial fibrillation) (HCC)    a. ? Dx 2014-->s/p DCCV;  b. CHA2DS2VASc = 6--> Coumadin.  . Prostate cancer ([place])   . RBBB   . Type II diabetes mellitus ([place])     Current Outpatient Medications  Medication Sig Dispense Refill  . ACCU-CHEK AVIVA PLUS test strip Use to check blood sugar up to 2 times daily 200 each 3  . Accu-Chek Softclix Lancets lancets Use to check blood sugar up to 2 times daily. 200 each 3  . acetaminophen (TYLENOL) 500 MG tablet Take 1,000 mg by mouth every 6 (six) hours as needed for moderate pain or headache.     . Alcohol Swabs 70 % PADS Use when checking blood sugar up to 3x daily 100 each 11  . allopurinol (ZYLOPRIM) 100 MG tablet Take 1 tablet (100 mg total) by mouth daily. 90 tablet 3  . aspirin 81 MG chewable tablet Chew 81 mg by mouth daily.    . Blood Glucose Monitoring Suppl (ACCU-CHEK AVIVA PLUS) w/Device KIT Use to check blood sugar up to 2 times daily 1 kit 0  . bosutinib (BOSULIF) 100 MG tablet Take 100 mg by mouth at bedtime as needed (tired/not feeling well). Take with food.     . Dulaglutide (TRULICITY) 2.99 BZ/1.6RC SOPN Inject 0.75 mg into the skin once a week. (Patient taking differently: Inject 0.75 mg into the skin every Saturday. ) 4 pen 0  . empagliflozin (JARDIANCE) 10 MG TABS tablet Take 10 mg by mouth daily before breakfast. 90 tablet 3  . gabapentin (NEURONTIN) 300 MG capsule Take 2 capsules (600 mg total) by mouth 2 (two) times daily. Max dose = 1219m daily for kidney function 360 capsule 1  . hydrALAZINE (APRESOLINE) 50 MG tablet Take 1 tablet (50 mg total) by mouth 3 (three) times daily. 270 tablet 2  . Insulin Glargine ([name] KWIKPEN) 100 UNIT/ML SOPN Inject 38 Units into the skin daily.     . Insulin Pen Needle 31G X 5 MM MISC 31 g by Does not apply route as directed. 100 each 5  . isosorbide mononitrate (IMDUR) 60 MG 24 hr tablet Take 1.5 tablets (90 mg total) by mouth daily. 135 tablet 2  . levothyroxine (SYNTHROID) 25 MCG tablet TAKE  1 TABLET (25 MCG TOTAL) BY MOUTH DAILY BEFORE BREAKFAST. 90 tablet 1  . losartan (COZAAR) 25 MG tablet Take 1 tablet (25 mg total) by mouth daily. 90 tablet 1  . Menthol-Zinc Oxide (GOLD BOND EX) Apply 1 application topically daily as needed (muscle pain).    . metoprolol succinate (TOPROL-XL) 25 MG 24 hr tablet Take 1 tablet (25 mg total) by mouth daily. 90 tablet 1  . Multiple Vitamins-Minerals (MULTIVITAMIN ADULTS 50+ PO) Take 1 tablet by mouth every morning.    . nitroGLYCERIN (NITROSTAT) 0.4 MG SL tablet Place 1 tablet (0.4 mg total) under the tongue every 5 (five) minutes x 3 doses as  needed for chest pain. 25 tablet 4  . omeprazole (PRILOSEC) 20 MG capsule Take 20 mg by mouth daily.     [name] Place 1 drop into both eyes daily as needed (dry eyes).    . polyethylene glycol (MIRALAX / GLYCOLAX) 17 g packet Take 17 g by mouth daily as needed for mild constipation.     . potassium chloride SA (KLOR-CON) 20 MEQ tablet Take 1 tablet (20 mEq total) by mouth daily. 90 tablet 3  . PROVENTIL HFA 108 (90 Base) MCG/ACT inhaler Inhale 2 puffs into the lungs every 6 (six) hours as needed for wheezing or shortness of breath.    . ranolazine (RANEXA) 500 MG 12 hr tablet Take 1 tablet (500 mg total) by mouth 2 (two) times daily. 180 tablet 2  . rosuvastatin (CRESTOR) 5 MG tablet Take 1 tablet (5 mg total) by mouth daily. 90 tablet 3  . torsemide (DEMADEX) 20 MG tablet Take 3 tablets (60 mg total) by mouth 2 (two) times daily. 540 tablet 2  . traMADol (ULTRAM) 50 MG tablet Take 1-2 tablets (50-100 mg total) by mouth at bedtime as needed. 30 tablet 2  . warfarin (COUMADIN) 3 MG tablet Take 1 tablet (3 mg total) by mouth as directed. 120 tablet 0   No current facility-administered medications for this encounter.    Allergies  Allergen Reactions  . Clopidogrel Anaphylaxis    altered mental status;  (electrolytes "out of wack" and confusion per family; THEY DO NOT  REMEMBER ANY OTHER REACTION)- MSB 10/10/15  . Ciprofloxacin Itching  . Mirtazapine Diarrhea    Bad dreams   . Spironolactone     gynecomastia      Social History   Socioeconomic History  . Marital status: Married    Spouse name: Not on file  . Number of children: Not on file  . Years of education: Not on file  . Highest education level: 7th grade  Occupational History  . 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  Tobacco Use  . Smoking status: Former Smoker    Packs/day: 1.75    Years: 20.00    Pack years: 35.00    Types: Cigarettes    Start date: 1955    Quit date: 1975    Years since quitting: 46.4  . Smokeless tobacco: Former Network engineer and Sexual Activity  . Alcohol use: No    Comment: previously drank heavily - quit 1979.  . Drug use: No  . Sexual activity: Never  Other Topics Concern  . Not on file  Social History Narrative   Working on transportation truck part time    Social Determinants of Radio broadcast assistant Strain: Low Risk   . Difficulty of Paying Living Expenses: Not very hard  Food Insecurity: No Food Insecurity  . Worried About Charity fundraiser in the Last Year: Never true  . Ran Out of Food in the Last Year: Never true  Transportation Needs: No Transportation Needs  . Lack of Transportation (Medical): No  . Lack of Transportation (Non-Medical): No  Physical Activity: Inactive  . Days of Exercise per Week: 0 days  . Minutes of Exercise per Session: 0 min  Stress: No Stress Concern Present  . Feeling of Stress : Not at all  Social Connections: Slightly Isolated  . Frequency of Communication with Friends and Family: More than three times a week  . Frequency of Social Gatherings  with Friends and Family: More than three times a week  . Attends Religious Services: More than 4 times per year  . Active Member of Clubs or Organizations: No  . Attends Archivist Meetings: Never  . Marital Status:  Married  Human resources officer Violence: Not At Risk  . Fear of Current or Ex-Partner: No  . Emotionally Abused: No  . Physically Abused: No  . Sexually Abused: No      Family History  Problem Relation Age of Onset  . Brain cancer Father   . Diabetes Sister   . Heart attack Sister     Vitals:   08/05/19 1040  BP: 128/62  Pulse: 79  SpO2: 97%  Weight: 92.3 kg (203 lb 6.4 oz)   Lucas Readings from Last 3 Encounters:  08/05/19 92.3 kg (203 lb 6.4 oz)  07/15/19 91.2 kg (201 lb)  07/08/19 90.4 kg (199 lb 3.2 oz)    PHYSICAL EXAM: General:  Well appearing elderly AAM. No respiratory difficulty HEENT: normal Neck: supple. no JVD. Carotids 2+ bilat; no bruits. No lymphadenopathy or thyromegaly appreciated. Cor: PMI nondisplaced. Regular rate & rhythm. No rubs, gallops or murmurs. Lungs: clear Abdomen: soft, nontender, nondistended. No hepatosplenomegaly. No bruits or masses. Good bowel sounds. Extremities: no cyanosis, clubbing, rash, edema Neuro: alert & oriented x 3, cranial nerves grossly intact. moves all 4 extremities w/o difficulty. Affect pleasant.    ASSESSMENT & PLAN:  1. Chronic systolic HF: - likely combination of iCM/NICM - EF 30-35% echo 2/21 (was 35-40% in 6/20) - NYHA III.  - Recent RHC 04/13/19 with elevated filling pressures but BNP normal  - Reds Clip 31%. - Volume status low. Cut back torsemide to 60 mg daily,  - Continue hydral 50 tid and imdur 90 daily - Continue Toprol 25 daily (dose reduced due to trifascicular block) - Continue Losartan 25 mg daily  - Continue Farxiga 10 mg daily  - Discussed Cardiomems. He would like to pursue. Consent for Cardiomems and set up once approved.    2. CAD - cath 04/13/19 LAD 70% with +FFR otherwise diffuse non-obstructive CAD  - PCI/DES LAD 1/21 - followed by Dr. Saunders Revel - No chest pain.  - currently on triple therapy w/ plans to stop Brilinta soon (per Dr. Saunders Revel, 3 months post PCI). He will continue on ASA and coumadin  thereafter.     3. CKD Stage 3a - Renal function stable on 07/15/19  - Check BMET today  4. DM  - continue Farxiga 10 mg daily   5. PAF - Zio Patch results pending.  - Home sleep study has been ordered to r/o OSA  - Continue warfarin for a/c  6. Snoring  Study pending - Needs home sleep study   Follow up in 4 weeks.   Darrick Grinder, NP  10:54 AM

## 2019-08-05 ENCOUNTER — Encounter (HOSPITAL_COMMUNITY): Payer: Self-pay

## 2019-08-05 ENCOUNTER — Telehealth: Payer: Medicare HMO

## 2019-08-05 ENCOUNTER — Ambulatory Visit: Payer: Self-pay | Admitting: General Practice

## 2019-08-05 ENCOUNTER — Ambulatory Visit (HOSPITAL_COMMUNITY)
Admission: RE | Admit: 2019-08-05 | Discharge: 2019-08-05 | Disposition: A | Discharge status: Home / Self Care | Payer: Medicare HMO | Source: Ambulatory Visit | Attending: Internal Medicine | Admitting: Internal Medicine

## 2019-08-05 ENCOUNTER — Other Ambulatory Visit: Payer: Self-pay

## 2019-08-05 ENCOUNTER — Telehealth: Payer: Self-pay

## 2019-08-05 ENCOUNTER — Ambulatory Visit: Payer: Medicare HMO | Admitting: Pulmonary Disease

## 2019-08-05 VITALS — BP 128/62 | HR 79 | Wt 203.4 lb

## 2019-08-05 DIAGNOSIS — I2510 Atherosclerotic heart disease of native coronary artery without angina pectoris: Secondary | ICD-10-CM | POA: Insufficient documentation

## 2019-08-05 DIAGNOSIS — Z79890 Hormone replacement therapy (postmenopausal): Secondary | ICD-10-CM | POA: Diagnosis not present

## 2019-08-05 DIAGNOSIS — Z888 Allergy status to other drugs, medicaments and biological substances status: Secondary | ICD-10-CM | POA: Diagnosis not present

## 2019-08-05 DIAGNOSIS — Z8249 Family history of ischemic heart disease and other diseases of the circulatory system: Secondary | ICD-10-CM | POA: Insufficient documentation

## 2019-08-05 DIAGNOSIS — Z7901 Long term (current) use of anticoagulants: Secondary | ICD-10-CM | POA: Diagnosis not present

## 2019-08-05 DIAGNOSIS — Z833 Family history of diabetes mellitus: Secondary | ICD-10-CM | POA: Diagnosis not present

## 2019-08-05 DIAGNOSIS — H5461 Unqualified visual loss, right eye, normal vision left eye: Secondary | ICD-10-CM | POA: Insufficient documentation

## 2019-08-05 DIAGNOSIS — E1122 Type 2 diabetes mellitus with diabetic chronic kidney disease: Secondary | ICD-10-CM | POA: Insufficient documentation

## 2019-08-05 DIAGNOSIS — E785 Hyperlipidemia, unspecified: Secondary | ICD-10-CM | POA: Insufficient documentation

## 2019-08-05 DIAGNOSIS — I130 Hypertensive heart and chronic kidney disease with heart failure and stage 1 through stage 4 chronic kidney disease, or unspecified chronic kidney disease: Secondary | ICD-10-CM | POA: Insufficient documentation

## 2019-08-05 DIAGNOSIS — N1831 Chronic kidney disease, stage 3a: Secondary | ICD-10-CM | POA: Insufficient documentation

## 2019-08-05 DIAGNOSIS — Z794 Long term (current) use of insulin: Secondary | ICD-10-CM | POA: Diagnosis not present

## 2019-08-05 DIAGNOSIS — R0683 Snoring: Secondary | ICD-10-CM | POA: Diagnosis not present

## 2019-08-05 DIAGNOSIS — Z955 Presence of coronary angioplasty implant and graft: Secondary | ICD-10-CM | POA: Diagnosis not present

## 2019-08-05 DIAGNOSIS — K219 Gastro-esophageal reflux disease without esophagitis: Secondary | ICD-10-CM | POA: Insufficient documentation

## 2019-08-05 DIAGNOSIS — M109 Gout, unspecified: Secondary | ICD-10-CM | POA: Insufficient documentation

## 2019-08-05 DIAGNOSIS — I428 Other cardiomyopathies: Secondary | ICD-10-CM | POA: Diagnosis not present

## 2019-08-05 DIAGNOSIS — M1990 Unspecified osteoarthritis, unspecified site: Secondary | ICD-10-CM | POA: Insufficient documentation

## 2019-08-05 DIAGNOSIS — I25118 Atherosclerotic heart disease of native coronary artery with other forms of angina pectoris: Secondary | ICD-10-CM | POA: Diagnosis not present

## 2019-08-05 DIAGNOSIS — I5042 Chronic combined systolic (congestive) and diastolic (congestive) heart failure: Secondary | ICD-10-CM | POA: Insufficient documentation

## 2019-08-05 DIAGNOSIS — N183 Chronic kidney disease, stage 3 unspecified: Secondary | ICD-10-CM

## 2019-08-05 DIAGNOSIS — E039 Hypothyroidism, unspecified: Secondary | ICD-10-CM | POA: Insufficient documentation

## 2019-08-05 DIAGNOSIS — Z8546 Personal history of malignant neoplasm of prostate: Secondary | ICD-10-CM | POA: Diagnosis not present

## 2019-08-05 DIAGNOSIS — Z87891 Personal history of nicotine dependence: Secondary | ICD-10-CM | POA: Diagnosis not present

## 2019-08-05 DIAGNOSIS — I480 Paroxysmal atrial fibrillation: Secondary | ICD-10-CM | POA: Diagnosis not present

## 2019-08-05 DIAGNOSIS — Z808 Family history of malignant neoplasm of other organs or systems: Secondary | ICD-10-CM | POA: Diagnosis not present

## 2019-08-05 DIAGNOSIS — Z881 Allergy status to other antibiotic agents status: Secondary | ICD-10-CM | POA: Diagnosis not present

## 2019-08-05 DIAGNOSIS — Z7982 Long term (current) use of aspirin: Secondary | ICD-10-CM | POA: Diagnosis not present

## 2019-08-05 DIAGNOSIS — I453 Trifascicular block: Secondary | ICD-10-CM | POA: Insufficient documentation

## 2019-08-05 DIAGNOSIS — Z79899 Other long term (current) drug therapy: Secondary | ICD-10-CM | POA: Diagnosis not present

## 2019-08-05 DIAGNOSIS — Z856 Personal history of leukemia: Secondary | ICD-10-CM | POA: Insufficient documentation

## 2019-08-05 LAB — BASIC METABOLIC PANEL
Anion gap: 12 (ref 5–15)
BUN: 27 mg/dL — ABNORMAL HIGH (ref 8–23)
CO2: 23 mmol/L (ref 22–32)
Calcium: 9.3 mg/dL (ref 8.9–10.3)
Chloride: 102 mmol/L (ref 98–111)
Creatinine, Ser: 1.75 mg/dL — ABNORMAL HIGH (ref 0.61–1.24)
GFR calc Af Amer: 41 mL/min — ABNORMAL LOW (ref >60)
GFR calc non Af Amer: 35 mL/min — ABNORMAL LOW (ref >60)
Glucose, Bld: 148 mg/dL — ABNORMAL HIGH (ref 70–99)
Potassium: 4.2 mmol/L (ref 3.5–5.1)
Sodium: 137 mmol/L (ref 135–145)

## 2019-08-05 MED ORDER — TORSEMIDE 20 MG PO TABS: 60.0000 mg | ORAL_TABLET | Freq: Every day | ORAL | 2 refills | Status: DC

## 2019-08-05 NOTE — Telephone Encounter (Signed)
Lucas Caldwell saw Dr today - plans to attend Monday

## 2019-08-05 NOTE — Progress Notes (Signed)
ReDS Vest / Clip - 08/05/19 1100      ReDS Vest / Clip   Station Marker  B    Ruler Value  36    ReDS Value Range  Low volume    ReDS Actual Value  31    Anatomical Comments  sitting

## 2019-08-05 NOTE — Chronic Care Management (AMB) (Signed)
  Chronic Care Management   Outreach Note  08/05/2019 Name: Lucas Caldwell MRN: [id number] DOB: December 10, 1933  Referred by: [name], DO Reason for referral : Chronic Care Management (Follow up: RNCM Chronic Disease Conditions and Care management needs)   An unsuccessful telephone outreach was attempted today. The patient was referred to the case management team for assistance with care management and care coordination.   Follow Up Plan: The care management team will reach out to the patient again over the next 30 to 60 days.   Noreene Larsson RN, MSN, [place] Mobile: [phone]

## 2019-08-05 NOTE — Patient Instructions (Addendum)
DECREASE Torsemide 60mg  (3 tabs) daily   Labs today We will only contact you if something comes back abnormal or we need to make some changes. Otherwise no news is good news!   Your physician recommends that you schedule a follow-up appointment in: 1 month with the Nurse Practitioner  NEXT APPOINTMENT: Monday, June 21st, 2021 at [facility]  Please call office at [phone] option 2 if you have any questions or concerns.   At the [facility], you and your health needs are our priority. As part of our continuing mission to provide you with exceptional heart care, we have created designated Provider Care Teams. These Care Teams include your primary Cardiologist (physician) and Advanced Practice Providers (APPs- Physician Assistants and Nurse Practitioners) who all work together to provide you with the care you need, when you need it.   You may see any of the following providers on your designated Care Team at your next follow up: [name] Dr Glori Bickers . Dr Loralie Champagne . Darrick Grinder, NP . Lyda Jester, PA . Audry Riles, PharmD   Please be sure to bring in all your medications bottles to every appointment.

## 2019-08-06 ENCOUNTER — Telehealth: Payer: Self-pay | Admitting: Internal Medicine

## 2019-08-06 ENCOUNTER — Other Ambulatory Visit: Payer: Self-pay | Admitting: Family Medicine

## 2019-08-06 DIAGNOSIS — E039 Hypothyroidism, unspecified: Secondary | ICD-10-CM

## 2019-08-06 MED ORDER — LEVOTHYROXINE SODIUM 25 MCG PO TABS: 25.0000 ug | ORAL_TABLET | Freq: Every day | ORAL | 1 refills | Status: DC

## 2019-08-06 MED ORDER — WARFARIN SODIUM 3 MG PO TABS: ORAL_TABLET | ORAL | 0 refills | Status: DC

## 2019-08-06 NOTE — Telephone Encounter (Signed)
Pt c/o medication issue:  1. Name of Medication:  warfarin  2. How are you currently taking this medication (dosage and times per day)? Please advise  3. Are you having a reaction (difficulty breathing--STAT)? no  4. What is your medication issue? Pharmacy calling for dose clarification

## 2019-08-06 NOTE — Telephone Encounter (Signed)
Pt request refill  levothyroxine (SYNTHROID) 25 MCG tablet  90 day  [place], [place] Phone:  [phone]  Fax:  [phone]

## 2019-08-09 ENCOUNTER — Ambulatory Visit: Admit: 2019-08-09 | Payer: MEDICARE

## 2019-08-11 ENCOUNTER — Encounter: Payer: Medicare HMO | Admitting: *Deleted

## 2019-08-11 ENCOUNTER — Other Ambulatory Visit: Payer: Self-pay

## 2019-08-11 DIAGNOSIS — Z794 Long term (current) use of insulin: Secondary | ICD-10-CM | POA: Diagnosis not present

## 2019-08-11 DIAGNOSIS — Z87891 Personal history of nicotine dependence: Secondary | ICD-10-CM | POA: Diagnosis not present

## 2019-08-11 DIAGNOSIS — Z7901 Long term (current) use of anticoagulants: Secondary | ICD-10-CM | POA: Diagnosis not present

## 2019-08-11 DIAGNOSIS — Z7982 Long term (current) use of aspirin: Secondary | ICD-10-CM | POA: Diagnosis not present

## 2019-08-11 DIAGNOSIS — I5042 Chronic combined systolic (congestive) and diastolic (congestive) heart failure: Secondary | ICD-10-CM | POA: Diagnosis not present

## 2019-08-11 DIAGNOSIS — E119 Type 2 diabetes mellitus without complications: Secondary | ICD-10-CM | POA: Diagnosis not present

## 2019-08-11 DIAGNOSIS — Z79899 Other long term (current) drug therapy: Secondary | ICD-10-CM | POA: Diagnosis not present

## 2019-08-11 DIAGNOSIS — I5022 Chronic systolic (congestive) heart failure: Secondary | ICD-10-CM

## 2019-08-11 DIAGNOSIS — I110 Hypertensive heart disease with heart failure: Secondary | ICD-10-CM | POA: Diagnosis not present

## 2019-08-11 DIAGNOSIS — N400 Enlarged prostate without lower urinary tract symptoms: Secondary | ICD-10-CM | POA: Diagnosis not present

## 2019-08-11 NOTE — Progress Notes (Signed)
Daily Session Note  Patient Details  Name: Lucas Caldwell MRN: [id number] Date of Birth: [birth year] Referring Provider:     Cardiac Rehab from 05/20/2019 in [facility] Cardiac and Pulmonary Rehab  Referring Provider  Bensimhon      Encounter Date: 08/11/2019  Check In: Session Check In - 08/11/19 1344      Check-In   Supervising physician immediately available to respond to emergencies  See telemetry face sheet for immediately available ER MD    Location  [facility]-Cardiac & Pulmonary Rehab    Staff Present  Renita Papa, RN BSN;Melissa Caiola RDN, Rowe Pavy, BA, ACSM CEP, Exercise Physiologist    Virtual Visit  No    Medication changes reported      No    Fall or balance concerns reported     No    Warm-up and Cool-down  Performed on first and last piece of equipment    Resistance Training Performed  Yes    VAD Patient?  No    PAD/SET Patient?  No      Pain Assessment   Currently in Pain?  No/denies          Social History   Tobacco Use  Smoking Status Former Smoker  . Packs/day: 1.75  . Years: 20.00  . Pack years: 35.00  . Types: Cigarettes  . Start date: 68  . Quit date: 77  . Years since quitting: 46.4  Smokeless Tobacco Former Systems developer    Goals Met:  Independence with exercise equipment Exercise tolerated well No report of cardiac concerns or symptoms Strength training completed today  Goals Unmet:  Not Applicable  Comments: Pt able to follow exercise prescription today without complaint.  Will continue to monitor for progression.    Dr. Emily Filbert is Medical Director for [place] and [name].

## 2019-08-12 ENCOUNTER — Encounter: Payer: Medicare HMO | Admitting: *Deleted

## 2019-08-12 ENCOUNTER — Other Ambulatory Visit: Payer: Self-pay

## 2019-08-12 DIAGNOSIS — Z7982 Long term (current) use of aspirin: Secondary | ICD-10-CM | POA: Diagnosis not present

## 2019-08-12 DIAGNOSIS — N400 Enlarged prostate without lower urinary tract symptoms: Secondary | ICD-10-CM | POA: Diagnosis not present

## 2019-08-12 DIAGNOSIS — E119 Type 2 diabetes mellitus without complications: Secondary | ICD-10-CM | POA: Diagnosis not present

## 2019-08-12 DIAGNOSIS — I5042 Chronic combined systolic (congestive) and diastolic (congestive) heart failure: Secondary | ICD-10-CM | POA: Diagnosis not present

## 2019-08-12 DIAGNOSIS — Z794 Long term (current) use of insulin: Secondary | ICD-10-CM | POA: Diagnosis not present

## 2019-08-12 DIAGNOSIS — Z7901 Long term (current) use of anticoagulants: Secondary | ICD-10-CM | POA: Diagnosis not present

## 2019-08-12 DIAGNOSIS — I110 Hypertensive heart disease with heart failure: Secondary | ICD-10-CM | POA: Diagnosis not present

## 2019-08-12 DIAGNOSIS — Z87891 Personal history of nicotine dependence: Secondary | ICD-10-CM | POA: Diagnosis not present

## 2019-08-12 DIAGNOSIS — Z79899 Other long term (current) drug therapy: Secondary | ICD-10-CM | POA: Diagnosis not present

## 2019-08-12 NOTE — Progress Notes (Signed)
Daily Session Note  Patient Details  Name: NAYSHAWN MESTA MRN: [id number] Date of Birth: 05-24-1933 Referring Provider:     Cardiac Rehab from 05/20/2019 in [facility] Cardiac and Pulmonary Rehab  Referring Provider  Bensimhon      Encounter Date: 08/12/2019  Check In: Session Check In - 08/12/19 1420      Check-In   Supervising physician immediately available to respond to emergencies  See telemetry face sheet for immediately available ER MD    Location  [facility]-Cardiac & Pulmonary Rehab    Staff Present  Renita Papa, RN BSN;Joseph [address] [place], [place], RCEP, CCRP, CCET    Virtual Visit  No    Medication changes reported      No    Fall or balance concerns reported     No    Warm-up and Cool-down  Performed on first and last piece of equipment    Resistance Training Performed  Yes    VAD Patient?  No    PAD/SET Patient?  No      Pain Assessment   Currently in Pain?  No/denies          Social History   Tobacco Use  Smoking Status Former Smoker  . Packs/day: 1.75  . Years: 20.00  . Pack years: 35.00  . Types: Cigarettes  . Start date: 19  . Quit date: 60  . Years since quitting: 46.4  Smokeless Tobacco Former Systems developer    Goals Met:  Independence with exercise equipment Exercise tolerated well No report of cardiac concerns or symptoms Strength training completed today  Goals Unmet:  Not Applicable  Comments: Pt able to follow exercise prescription today without complaint.  Will continue to monitor for progression.    Dr. Emily Filbert is Medical Director for [place] and [name].

## 2019-08-13 DIAGNOSIS — I480 Paroxysmal atrial fibrillation: Secondary | ICD-10-CM | POA: Diagnosis not present

## 2019-08-13 NOTE — Addendum Note (Signed)
Encounter addended by: Micki Riley, RN on: 08/13/2019 4:23 PM  Actions taken: Imaging Exam ended

## 2019-08-16 ENCOUNTER — Telehealth: Payer: Medicare HMO

## 2019-08-18 ENCOUNTER — Encounter: Payer: Medicare HMO | Attending: Internal Medicine | Admitting: *Deleted

## 2019-08-18 ENCOUNTER — Other Ambulatory Visit: Payer: Self-pay

## 2019-08-18 DIAGNOSIS — Z7982 Long term (current) use of aspirin: Secondary | ICD-10-CM | POA: Diagnosis not present

## 2019-08-18 DIAGNOSIS — E039 Hypothyroidism, unspecified: Secondary | ICD-10-CM | POA: Insufficient documentation

## 2019-08-18 DIAGNOSIS — I5040 Unspecified combined systolic (congestive) and diastolic (congestive) heart failure: Secondary | ICD-10-CM | POA: Diagnosis present

## 2019-08-18 DIAGNOSIS — Z7901 Long term (current) use of anticoagulants: Secondary | ICD-10-CM | POA: Insufficient documentation

## 2019-08-18 DIAGNOSIS — Z87891 Personal history of nicotine dependence: Secondary | ICD-10-CM | POA: Insufficient documentation

## 2019-08-18 DIAGNOSIS — Z794 Long term (current) use of insulin: Secondary | ICD-10-CM | POA: Insufficient documentation

## 2019-08-18 DIAGNOSIS — I5022 Chronic systolic (congestive) heart failure: Secondary | ICD-10-CM

## 2019-08-18 DIAGNOSIS — Z79899 Other long term (current) drug therapy: Secondary | ICD-10-CM | POA: Diagnosis not present

## 2019-08-18 DIAGNOSIS — K219 Gastro-esophageal reflux disease without esophagitis: Secondary | ICD-10-CM | POA: Insufficient documentation

## 2019-08-18 DIAGNOSIS — I110 Hypertensive heart disease with heart failure: Secondary | ICD-10-CM | POA: Diagnosis not present

## 2019-08-18 DIAGNOSIS — E119 Type 2 diabetes mellitus without complications: Secondary | ICD-10-CM | POA: Diagnosis not present

## 2019-08-18 DIAGNOSIS — I480 Paroxysmal atrial fibrillation: Secondary | ICD-10-CM | POA: Insufficient documentation

## 2019-08-18 DIAGNOSIS — E785 Hyperlipidemia, unspecified: Secondary | ICD-10-CM | POA: Insufficient documentation

## 2019-08-18 DIAGNOSIS — I5042 Chronic combined systolic (congestive) and diastolic (congestive) heart failure: Secondary | ICD-10-CM

## 2019-08-18 DIAGNOSIS — N400 Enlarged prostate without lower urinary tract symptoms: Secondary | ICD-10-CM | POA: Insufficient documentation

## 2019-08-18 NOTE — Progress Notes (Signed)
Daily Session Note  Patient Details  Name: Lucas Caldwell MRN: [id number] Date of Birth: 07-23-1933 Referring Provider:     Cardiac Rehab from 05/20/2019 in [facility] Cardiac and Pulmonary Rehab  Referring Provider  Bensimhon      Encounter Date: 08/18/2019  Check In: Session Check In - 08/18/19 1336      Check-In   Supervising physician immediately available to respond to emergencies  See telemetry face sheet for immediately available ER MD    Location  [facility]-Cardiac & Pulmonary Rehab    Staff Present  Renita Papa, RN BSN;Jessica Iago, MA, RCEP, CCRP, CCET;Amanda Sommer, [place], ACSM CEP, Exercise Physiologist    Virtual Visit  No    Medication changes reported      No    Fall or balance concerns reported     No    Warm-up and Cool-down  Performed on first and last piece of equipment    Resistance Training Performed  Yes    VAD Patient?  No    PAD/SET Patient?  No      Pain Assessment   Currently in Pain?  No/denies          Social History   Tobacco Use  Smoking Status Former Smoker  . Packs/day: 1.75  . Years: 20.00  . Pack years: 35.00  . Types: Cigarettes  . Start date: 84  . Quit date: 31  . Years since quitting: 46.4  Smokeless Tobacco Former Systems developer    Goals Met:  Independence with exercise equipment Exercise tolerated well No report of cardiac concerns or symptoms Strength training completed today  Goals Unmet:  Not Applicable  Comments: Pt able to follow exercise prescription today without complaint.  Will continue to monitor for progression.    Dr. Emily Filbert is Medical Director for [place] and [name].

## 2019-08-19 ENCOUNTER — Other Ambulatory Visit: Payer: Self-pay

## 2019-08-19 ENCOUNTER — Encounter: Payer: Medicare HMO | Admitting: *Deleted

## 2019-08-19 DIAGNOSIS — E119 Type 2 diabetes mellitus without complications: Secondary | ICD-10-CM | POA: Diagnosis not present

## 2019-08-19 DIAGNOSIS — Z79899 Other long term (current) drug therapy: Secondary | ICD-10-CM | POA: Diagnosis not present

## 2019-08-19 DIAGNOSIS — N400 Enlarged prostate without lower urinary tract symptoms: Secondary | ICD-10-CM | POA: Diagnosis not present

## 2019-08-19 DIAGNOSIS — Z87891 Personal history of nicotine dependence: Secondary | ICD-10-CM | POA: Diagnosis not present

## 2019-08-19 DIAGNOSIS — Z7901 Long term (current) use of anticoagulants: Secondary | ICD-10-CM | POA: Diagnosis not present

## 2019-08-19 DIAGNOSIS — I110 Hypertensive heart disease with heart failure: Secondary | ICD-10-CM | POA: Diagnosis not present

## 2019-08-19 DIAGNOSIS — I5042 Chronic combined systolic (congestive) and diastolic (congestive) heart failure: Secondary | ICD-10-CM | POA: Diagnosis not present

## 2019-08-19 DIAGNOSIS — Z794 Long term (current) use of insulin: Secondary | ICD-10-CM | POA: Diagnosis not present

## 2019-08-19 DIAGNOSIS — I5022 Chronic systolic (congestive) heart failure: Secondary | ICD-10-CM

## 2019-08-19 DIAGNOSIS — Z7982 Long term (current) use of aspirin: Secondary | ICD-10-CM | POA: Diagnosis not present

## 2019-08-19 NOTE — Progress Notes (Signed)
Daily Session Note  Patient Details  Name: EVANN ERAZO MRN: [id number] Date of Birth: 18-Feb-1934 Referring Provider:     Cardiac Rehab from 05/20/2019 in [facility] Cardiac and Pulmonary Rehab  Referring Provider  Bensimhon      Encounter Date: 08/19/2019  Check In: Session Check In - 08/19/19 1332      Check-In   Supervising physician immediately available to respond to emergencies  See telemetry face sheet for immediately available ER MD    Location  [facility]-Cardiac & Pulmonary Rehab    Staff Present  Renita Papa, RN Margurite Auerbach, MS Exercise Physiologist;Amanda Oletta Darter, [place], ACSM CEP, Exercise Physiologist;Laureen Owens Shark, BS, RRT, CPFT    Virtual Visit  No    Medication changes reported      No    Fall or balance concerns reported     No    Warm-up and Cool-down  Performed on first and last piece of equipment    Resistance Training Performed  Yes    VAD Patient?  No    PAD/SET Patient?  No      Pain Assessment   Currently in Pain?  No/denies          Social History   Tobacco Use  Smoking Status Former Smoker  . Packs/day: 1.75  . Years: 20.00  . Pack years: 35.00  . Types: Cigarettes  . Start date: 4  . Quit date: 37  . Years since quitting: 46.4  Smokeless Tobacco Former Systems developer    Goals Met:  Independence with exercise equipment Exercise tolerated well No report of cardiac concerns or symptoms Strength training completed today  Goals Unmet:  Not Applicable  Comments: Pt able to follow exercise prescription today without complaint.  Will continue to monitor for progression.    Dr. Emily Filbert is Medical Director for [place] and [name].

## 2019-08-23 ENCOUNTER — Ambulatory Visit (INDEPENDENT_AMBULATORY_CARE_PROVIDER_SITE_OTHER): Payer: Medicare HMO | Admitting: Family

## 2019-08-23 ENCOUNTER — Other Ambulatory Visit: Payer: Self-pay

## 2019-08-23 ENCOUNTER — Encounter: Payer: Self-pay | Admitting: Family

## 2019-08-23 VITALS — BP 116/78 | HR 99 | Ht 64.0 in | Wt 201.4 lb

## 2019-08-23 DIAGNOSIS — N183 Chronic kidney disease, stage 3 unspecified: Secondary | ICD-10-CM

## 2019-08-23 DIAGNOSIS — Z79899 Other long term (current) drug therapy: Secondary | ICD-10-CM | POA: Diagnosis not present

## 2019-08-23 DIAGNOSIS — I5042 Chronic combined systolic (congestive) and diastolic (congestive) heart failure: Secondary | ICD-10-CM

## 2019-08-23 DIAGNOSIS — Z7901 Long term (current) use of anticoagulants: Secondary | ICD-10-CM

## 2019-08-23 DIAGNOSIS — I480 Paroxysmal atrial fibrillation: Secondary | ICD-10-CM

## 2019-08-23 DIAGNOSIS — I25118 Atherosclerotic heart disease of native coronary artery with other forms of angina pectoris: Secondary | ICD-10-CM | POA: Diagnosis not present

## 2019-08-23 MED ORDER — EASY TOUCH 31 GAUGE X 5/16" NEEDLE (8 MM)
1 refills | 0 days | Status: CP
Start: 2019-08-23 — End: ?

## 2019-08-23 NOTE — Progress Notes (Signed)
Office Visit    Patient Name: Lucas Caldwell Date of Encounter: 08/23/2019  Primary Care Provider:  [name], DO Primary Cardiologist:  [name], MD Electrophysiologist:  None   Chief Complaint    Lucas Caldwell is a 86 y.o. male with a hx of CAD, chronic systolic heart failure, HTN, HLD, DM2, PAF, CKDIII, CML, iron deficiency anemia, GI bleed due to duodenal ulcers, BPH, hypothyroidism presents today for weakness.    Past Medical History    Past Medical History:  Diagnosis Date  . Arthritis    "probably in his legs" (08/15/2016)  . Blind right eye   . BPH (benign prostatic hyperplasia)   . Breast asymmetry    Left breast is larger, present for several years.  [name] CAD (coronary artery disease)    a. Cath in the late 90's - reportedly ok;  b. 2014 s/p stenting x 2 @ [facility]; c 08/19/16 Cath/PCI with DES -> RCA, plan to treat LM 70% medically. Seen by surgery and felt to be too high risk for CABG; d. 08/2018 Cath: LM 70 (iFR 0.86), LAD 20ost, 40p, 50/40m D1 70, LCX patent stent, OM1 30, RCA patent stent, 415mSR, 40d, RPDA 30. PCWP 8. CO/CI 4.0/2.1.  . Marland Kitchenhronic combined systolic (congestive) and diastolic (congestive) heart failure (HCMidway   a. Previously reduced EF-->50% by echo in 2012;  b. 06/2015 Echo: EF 50-55%  c. 07/2016 Echo: EF 45-50%; d. 12/2017 Echo: EF 55%, Gr1 DD, mild MR; e. 08/2018 Echo: EF 35-40%, mildly dil PA.  . Marland Kitchenhronic kidney disease (CKD), stage IV (severe) (HCMarlow  . CML (chronic myelocytic leukemia) (HCDewar  . GERD (gastroesophageal reflux disease)   . GIB (gastrointestinal bleeding)    a. 12/2017 3 unit PRBC GIB in setting of coumadin-->Endo/colon multiple duodenal ulcers and a single bleeding ulcer in the proximal ascending colon status post hemostatic clipping x2.  . Gout   . Hyperlipemia   . Hypertension   . Hypothyroidism   . Iron deficiency anemia   . Ischemic cardiomyopathy    a. Previously reduced EF-->50% by echo in 2012;  b. 06/2015  Echo: EF 50-55%, Gr2 DD, mild MR, mildly dil LA, Ao sclerosis, mild TR;  c. 07/2016 Echo: EF 45-50%; d. 12/2017 Echo: EF 55%, Gr1 DD, mild MR; e. 08/2018 Echo: EF 35-40%.  . Migraine    "in the 1960s" (08/15/2016)  . PAF (paroxysmal atrial fibrillation) (HCC)    a. ? Dx 2014-->s/p DCCV;  b. CHA2DS2VASc = 6--> Coumadin.  . Prostate cancer (HCCaldwell  . RBBB   . Type II diabetes mellitus (HCNorth Webster   Past Surgical History:  Procedure Laterality Date  . CATARACT EXTRACTION W/ INTRAOCULAR LENS  IMPLANT, BILATERAL Bilateral   . COLONOSCOPY N/A 01/13/2018   Procedure: COLONOSCOPY;  Surgeon: VaLin LandsmanMD;  [facility];  Service: Gastroenterology;  Laterality: N/A;  . COLONOSCOPY WITH PROPOFOL N/A 01/01/2018   Procedure: COLONOSCOPY WITH PROPOFOL;  Surgeon: VaLin LandsmanMD;  [facility];  Service: Gastroenterology;  Laterality: N/A;  . CORONARY ANGIOPLASTY WITH STENT PLACEMENT  09/2012   2 stents  . CORONARY STENT INTERVENTION N/A 08/19/2016   Procedure: Coronary Stent Intervention;  Surgeon: EnNelva BushMD;  [facility];  Service: Cardiovascular;  Laterality: N/A;  . CORONARY STENT INTERVENTION N/A 04/13/2019   Procedure: CORONARY STENT INTERVENTION;  Surgeon: EnNelva BushMD;  [place] [facility];  Service: Cardiovascular;  Laterality:  N/A;  . ESOPHAGEAL MANOMETRY N/A 12/16/2017   Procedure: ESOPHAGEAL MANOMETRY (EM);  Surgeon: Lin Landsman, MD;  Location: [facility] ENDOSCOPY;  Service: Gastroenterology;  Laterality: N/A;  . ESOPHAGOGASTRODUODENOSCOPY N/A 12/20/2016   Procedure: ESOPHAGOGASTRODUODENOSCOPY (EGD);  Surgeon: Lin Landsman, MD;  Location: [facility] ENDOSCOPY;  Service: Gastroenterology;  Laterality: N/A;  . ESOPHAGOGASTRODUODENOSCOPY N/A 01/13/2018   Procedure: ESOPHAGOGASTRODUODENOSCOPY (EGD);  Surgeon: Lin Landsman, MD;  Location: [facility] ENDOSCOPY;  Service: Gastroenterology;  Laterality: N/A;  .  ESOPHAGOGASTRODUODENOSCOPY (EGD) WITH PROPOFOL N/A 11/03/2017   Procedure: ESOPHAGOGASTRODUODENOSCOPY (EGD) WITH PROPOFOL;  Surgeon: Lin Landsman, MD;  Location[facility] END[facility]COPY;  Service: Gastroenterology;  Laterality: N/A;  . EYE SURGERY    . HERNIA REPAIR     "navel"  . LEFT HEART CATH AND CORONARY ANGIOGRAPHY N/A 08/14/2016   Procedure: Left Heart Cath and Coronary Angiography;  Surgeon: [name], MD;  Location: [place] CV LAB;  Service: Cardiovascular;  Laterality: N/A;  . RIGHT/LEFT HEART CATH AND CORONARY ANGIOGRAPHY N/A 09/01/2018   Procedure: RIGHT/LEFT HEART CATH AND CORONARY ANGIOGRAPHY;  Surgeon: [name], MD;  Location: [place] CV LAB;  Service: Cardiovascular;  Laterality: N/A;  . RIGHT/LEFT HEART CATH AND CORONARY ANGIOGRAPHY N/A 04/13/2019   Procedure: RIGHT/LEFT HEART CATH AND CORONARY ANGIOGRAPHY;  Surgeon: [name], MD;  Location: [place] CV LAB;  Service: Cardiovascular;  Laterality: N/A;  . TOE AMPUTATION Right    "big toe"    Allergies  Allergies  Allergen Reactions  . Clopidogrel Anaphylaxis    altered mental status;  (electrolytes "out of wack" and confusion per family; THEY DO NOT REMEMBER ANY OTHER REACTION)- MSB 10/10/15  . Ciprofloxacin Itching  . Mirtazapine Diarrhea    Bad dreams   . Spironolactone     gynecomastia    History of Present Illness    Lucas Caldwell is a 86 y.o. male with a hx of CAD, chronic systolic heart failure, HTN, HLD, DM2, PAF, CKDIII, CML, iron deficiency anemia, GI bleed due to duodenal ulcers, BPH, hypothyroidism. He was last seen by Darrick Grinder, NP in HF clinic 08/06/19.  History of NICM previously followed by Dr. Andree Elk at [facility] with good response to medical therapy 2013 with EF reported 50%. 08/2016 underwent PCI and DESx2 to RCA. Switched care to Dr. Saunders Revel 2019. Echo 08/2018 with LVEF 35-40%, indeterminate LV diastolic filling pressure, tricuspid AV, mildly dilated pulmonary artery, no  significant valvular dysfunction. Cardiac cath 08/2018 70% LM stenosis with FFR 0.86 though drop in iFR due to prox LAD disease with only small gradient involving ostial LM - Imdur was increased. Admitted 03/2019 chest pain and acute on chronic heart failure. Cardiac cath 03/2019 LM 60%, LAD 70% with positive FFR - underwent PCI/DES to LAD. Echo 04/22/19 with LVEF 30-35%.   Titration of Metoprolol limited by bifasicular block. Previous renal decline with ACE/ARB/ARNI but presently tolerating Losartan 55m daily.   He was seen in heart failure clinic by ADarrick Grinder NP 08/05/19. A his Reds Vest was 31% his Torsemide was reduced to 666mdaily. Home sleep study ordered due to snoring.   Feeling very weak and fatigued. Tells me he felt well for 2 weeks after seeing AmDarrick GrinderNP then felt poorly the last week. Approximately 1 week ago he self increased his Torsemide to 6068mn the AM and 44m23m the PM as he didn't feel he was urinating as much as he should be.   Drinks three 16 oz bottles of water per day. Endorses  low sodium diet.   Tells me he can't hardly walk. Having difficulty participating in cardiac rehab due to fatigue, lightheadedness, hypotension.   EKGs/Labs/Other Studies Reviewed:   The following studies were reviewed today:  EKG:  EKG is ordered today.  The ekg ordered today demonstrates NSR 99 bpm with left axis deviation, RBBB, and LVH with repolarization abnormality.   Recent Labs: 08/28/2018: ALT 14 05/04/2019: Magnesium 1.8; TSH 3.850 07/15/2019: B Natriuretic Peptide 81.8; Hemoglobin 11.9; Platelets 237 08/05/2019: BUN 27; Creatinine, Ser 1.75; Potassium 4.2; Sodium 137  Recent Lipid Panel    Component Value Date/Time   CHOL 110 08/28/2018 1107   TRIG 155 (H) 08/28/2018 1107   HDL 28 (L) 08/28/2018 1107   CHOLHDL 3.9 08/28/2018 1107   VLDL 31 08/28/2018 1107   LDLCALC 51 08/28/2018 1107    Home Medications   Current Meds  Medication Sig  . ACCU-CHEK AVIVA PLUS test strip Use  to check blood sugar up to 2 times daily  . Accu-Chek Softclix Lancets lancets Use to check blood sugar up to 2 times daily.  [name] acetaminophen (TYLENOL) 500 MG tablet Take 1,000 mg by mouth every 6 (six) hours as needed for moderate pain or headache.   . Alcohol Swabs 70 % PADS Use when checking blood sugar up to 3x daily  . allopurinol (ZYLOPRIM) 100 MG tablet Take 1 tablet (100 mg total) by mouth daily.  [name] aspirin 81 MG chewable tablet Chew 81 mg by mouth daily.  . Blood Glucose Monitoring Suppl (ACCU-CHEK AVIVA PLUS) w/Device KIT Use to check blood sugar up to 2 times daily  . bosutinib (BOSULIF) 100 MG tablet Take 100 mg by mouth at bedtime as needed (tired/not feeling well). Take with food.   . Dulaglutide (TRULICITY) 7.03 JK/0.9FG SOPN Inject 0.75 mg into the skin once a week. (Patient taking differently: Inject 0.75 mg into the skin every Saturday. )  . empagliflozin (JARDIANCE) 10 MG TABS tablet Take 10 mg by mouth daily before breakfast.  . gabapentin (NEURONTIN) 300 MG capsule Take 2 capsules (600 mg total) by mouth 2 (two) times daily. Max dose = 125m daily for kidney function  . hydrALAZINE (APRESOLINE) 50 MG tablet Take 1 tablet (50 mg total) by mouth 3 (three) times daily.  . Insulin Glargine ([name] KWIKPEN) 100 UNIT/ML SOPN Inject 38 Units into the skin daily.   . Insulin Pen Needle 31G X 5 MM MISC 31 g by Does not apply route as directed.  . isosorbide mononitrate (IMDUR) 60 MG 24 hr tablet Take 1.5 tablets (90 mg total) by mouth daily.  .Marland Kitchenlevothyroxine (SYNTHROID) 25 MCG tablet Take 1 tablet (25 mcg total) by mouth daily before breakfast.  . losartan (COZAAR) 25 MG tablet Take 1 tablet (25 mg total) by mouth daily.  . Menthol-Zinc Oxide (GOLD BOND EX) Apply 1 application topically daily as needed (muscle pain).  . metoprolol succinate (TOPROL-XL) 25 MG 24 hr tablet Take 1 tablet (25 mg total) by mouth daily.  . Multiple Vitamins-Minerals (MULTIVITAMIN ADULTS 50+ PO) Take 1  tablet by mouth every morning.  . nitroGLYCERIN (NITROSTAT) 0.4 MG SL tablet Place 1 tablet (0.4 mg total) under the tongue every 5 (five) minutes x 3 doses as needed for chest pain.  .Marland Kitchenomeprazole (PRILOSEC) 20 MG capsule Take 20 mg by mouth daily.   .[name] (SYSTANE OP) Place 1 drop into both eyes daily as needed (dry eyes).  . polyethylene glycol (MIRALAX / GLYCOLAX) 17 g packet  Take 17 g by mouth daily as needed for mild constipation.   . potassium chloride SA (KLOR-CON) 20 MEQ tablet Take 1 tablet (20 mEq total) by mouth daily.  [name] PROVENTIL HFA 108 (90 Base) MCG/ACT inhaler Inhale 2 puffs into the lungs every 6 (six) hours as needed for wheezing or shortness of breath.  . ranolazine (RANEXA) 500 MG 12 hr tablet Take 1 tablet (500 mg total) by mouth 2 (two) times daily.  . rosuvastatin (CRESTOR) 5 MG tablet Take 1 tablet (5 mg total) by mouth daily.  [name] torsemide (DEMADEX) 20 MG tablet Take 3 tablets (60 mg total) by mouth daily.  . traMADol (ULTRAM) 50 MG tablet Take 1-2 tablets (50-100 mg total) by mouth at bedtime as needed.  . warfarin (COUMADIN) 3 MG tablet Take 1 to 1 1/2 tablets by mouth daily as directed by the coumadin clinic      Review of Systems   Review of Systems  Constitution: Positive for malaise/fatigue. Negative for chills and fever.  Cardiovascular: Negative for chest pain, dyspnea on exertion, irregular heartbeat, leg swelling, near-syncope, orthopnea, palpitations and syncope.  Respiratory: Negative for cough, shortness of breath and wheezing.   Gastrointestinal: Negative for melena, nausea and vomiting.  Genitourinary: Negative for hematuria.  Neurological: Positive for light-headedness. Negative for dizziness and weakness.   All other systems reviewed and are otherwise negative except as noted above.  Physical Exam    VS:  BP 116/78 (BP Location: Left Arm, Patient Position: Sitting, Cuff Size: Normal)   Pulse 99   Ht '5\' 4"'  (1.626 m)    Wt 201 lb 6 oz (91.3 kg)   SpO2 98%   BMI 34.57 kg/m  , BMI Body mass index is 34.57 kg/m. GEN: Well nourished, overweight, well developed, in no acute distress. HEENT: normal. Neck: Supple, no JVD, carotid bruits, or masses. Cardiac: RRR, no murmurs, rubs, or gallops. No clubbing, cyanosis, edema.  Radials/PT 2+ and equal bilaterally.  Respiratory:  Respirations regular and unlabored, clear to auscultation bilaterally. GI: Soft, nontender, nondistended. MS: No deformity or atrophy. Skin: Warm and dry, no rash. Neuro:  Strength and sensation are intact. Psych: Normal affect.  Assessment & Plan    1. HFrEF - Likely combination ICM and NICM.  Reds Vest 32%. Endorses weakness, fatigue, lightheadedness. Diuretic dose reduced at clinic visit 08/06/19 due to hypovolemia. He self increased diuretic. Education provided that it was appropriate to have less urine output on reduced dose Torsemide. Reports no SOB nor edema. Recommend again reducing Torsemide to 66m daily as he is hypotensive, slightly tachycardic, and weak on exam. Reds Vest today 32%. Continue <2L fluid per day. Continue low sodium diet. Continue to participate in cardiac rehab. If BP continues to drop may require reduced dose of Losartan.   2. CAD - Cath 04/13/19 LAD 70% with +FFR otherwise nonobstructive CAD. Previous PCI/DES to LAS 03/2019. GDMIT includes BB, statin, asa. Continue aspirin and coumadin. Completed recommended Brilinta course. No indication for repeat ischemic evaluation at this time.   3. CKDIIIa - Careful titration of diuretics and antihypertensive. BMET today.   4. DM2 - Continue Empagliflozin (Jardiance). Was previously unable to afford FIran  04/13/19 A1c 7.6.   5. PAF - Monitor 07/15/19 with NSR, one 5-beat run NSVT, two runs SVT, rare PAC/PVC. EKG today shows he is maintaining NSR. Continue Toprol 273mdaily. Anticoagulated with Warfarin. Denies bleeding complications. CBC today in setting of dyspnea/fatigue to  rule out anemia.   6. Snoring - Plans  for home sleep study per pulmonology and HF clinic notes. Will discuss with those providers if they have any updates on timing.   7. Bifascicular heart block - RBBB present on EKG today. Dose of Metoprolol previously reduced. No near syncope, syncope.   8. HLD, LDL goal <70 - Continue Crestor 14m daily.   Disposition: Follow up in 2 week(s) with ADarrick Grinder NP as previously scheduled   CLoel Dubonnet NP 08/23/2019, 4:57 PM

## 2019-08-23 NOTE — Patient Instructions (Signed)
Medication Instructions:  Your physician recommends that you continue on your current medications as directed. Please refer to the Current Medication list given to you today.  TAKE Torsemide 60 mg by mouth once a day in the morning.   *If you need a refill on your cardiac medications before your next appointment, please call your pharmacy*   Lab Work: Your physician recommends that you return for lab work in: [place], BMET.   If you have labs (blood work) drawn today and your tests are completely normal, you will receive your results only by: [name] MyChart Message (if you have MyChart) OR . A paper copy in the mail If you have any lab test that is abnormal or we need to change your treatment, we will call you to review the results.   Testing/Procedures: none   Follow-Up: At [facility], you and your health needs are our priority.  As part of our continuing mission to provide you with exceptional heart care, we have created designated Provider Care Teams.  These Care Teams include your primary Cardiologist (physician) and Advanced Practice Providers (APPs -  Physician Assistants and Nurse Practitioners) who all work together to provide you with the care you need, when you need it.  We recommend signing up for the patient portal called "MyChart".  Sign up information is provided on this After Visit Summary.  MyChart is used to connect with patients for Virtual Visits (Telemedicine).  Patients are able to view lab/test results, encounter notes, upcoming appointments, etc.  Non-urgent messages can be sent to your provider as well.   To learn more about what you can do with MyChart, go to [email or web address].    Your next appointment:   As scheduled with Amy.  The format for your next appointment:   In Person

## 2019-08-24 ENCOUNTER — Telehealth: Payer: Self-pay

## 2019-08-24 LAB — CBC WITH DIFFERENTIAL/PLATELET
Basophils Absolute: 0.1 10*3/uL (ref 0.0–0.2)
Basos: 1 %
EOS (ABSOLUTE): 0.2 10*3/uL (ref 0.0–0.4)
Eos: 2 %
Hematocrit: 39.9 % (ref 37.5–51.0)
Hemoglobin: 13.4 g/dL (ref 13.0–17.7)
Immature Grans (Abs): 0 10*3/uL (ref 0.0–0.1)
Immature Granulocytes: 0 %
Lymphocytes Absolute: 1.8 10*3/uL (ref 0.7–3.1)
Lymphs: 22 %
MCH: 28.3 pg (ref 26.6–33.0)
MCHC: 33.6 g/dL (ref 31.5–35.7)
MCV: 84 fL (ref 79–97)
Monocytes Absolute: 0.9 10*3/uL (ref 0.1–0.9)
Monocytes: 11 %
Neutrophils Absolute: 5.4 10*3/uL (ref 1.4–7.0)
Neutrophils: 64 %
Platelets: 230 10*3/uL (ref 150–450)
RBC: 4.74 x10E6/uL (ref 4.14–5.80)
RDW: 14.4 % (ref 11.6–15.4)
WBC: 8.4 10*3/uL (ref 3.4–10.8)

## 2019-08-24 LAB — BASIC METABOLIC PANEL
BUN/Creatinine Ratio: 13 (ref 10–24)
BUN: 21 mg/dL (ref 8–27)
CO2: 22 mmol/L (ref 20–29)
Calcium: 9.3 mg/dL (ref 8.6–10.2)
Chloride: 98 mmol/L (ref 96–106)
Creatinine, Ser: 1.56 mg/dL — ABNORMAL HIGH (ref 0.76–1.27)
GFR calc Af Amer: 47 mL/min/{1.73_m2} — ABNORMAL LOW (ref >59)
GFR calc non Af Amer: 40 mL/min/{1.73_m2} — ABNORMAL LOW (ref >59)
Glucose: 167 mg/dL — ABNORMAL HIGH (ref 65–99)
Potassium: 4.1 mmol/L (ref 3.5–5.2)
Sodium: 138 mmol/L (ref 134–144)

## 2019-08-24 NOTE — Telephone Encounter (Signed)
-----   Message from Loel Dubonnet, NP sent at 08/24/2019  7:49 AM EDT ----- Stable kidney function. Normal electrolytes. CBC with no evidence of anemia. Good result!

## 2019-08-24 NOTE — Telephone Encounter (Signed)
Call to patient to review labs.    Pt verbalized understanding and has no further questions at this time.    Advised pt to call for any further questions or concerns.  No further orders.   

## 2019-08-25 ENCOUNTER — Ambulatory Visit (INDEPENDENT_AMBULATORY_CARE_PROVIDER_SITE_OTHER): Payer: Medicare HMO | Admitting: Pharmacist

## 2019-08-25 ENCOUNTER — Other Ambulatory Visit: Payer: Self-pay

## 2019-08-25 ENCOUNTER — Encounter: Payer: Medicare HMO | Admitting: *Deleted

## 2019-08-25 DIAGNOSIS — E119 Type 2 diabetes mellitus without complications: Secondary | ICD-10-CM | POA: Diagnosis not present

## 2019-08-25 DIAGNOSIS — Z7982 Long term (current) use of aspirin: Secondary | ICD-10-CM | POA: Diagnosis not present

## 2019-08-25 DIAGNOSIS — N400 Enlarged prostate without lower urinary tract symptoms: Secondary | ICD-10-CM | POA: Diagnosis not present

## 2019-08-25 DIAGNOSIS — I25118 Atherosclerotic heart disease of native coronary artery with other forms of angina pectoris: Secondary | ICD-10-CM | POA: Diagnosis not present

## 2019-08-25 DIAGNOSIS — Z7901 Long term (current) use of anticoagulants: Secondary | ICD-10-CM | POA: Diagnosis not present

## 2019-08-25 DIAGNOSIS — I110 Hypertensive heart disease with heart failure: Secondary | ICD-10-CM | POA: Diagnosis not present

## 2019-08-25 DIAGNOSIS — Z794 Long term (current) use of insulin: Secondary | ICD-10-CM

## 2019-08-25 DIAGNOSIS — Z87891 Personal history of nicotine dependence: Secondary | ICD-10-CM | POA: Diagnosis not present

## 2019-08-25 DIAGNOSIS — E1121 Type 2 diabetes mellitus with diabetic nephropathy: Secondary | ICD-10-CM | POA: Diagnosis not present

## 2019-08-25 DIAGNOSIS — I5042 Chronic combined systolic (congestive) and diastolic (congestive) heart failure: Secondary | ICD-10-CM

## 2019-08-25 DIAGNOSIS — Z79899 Other long term (current) drug therapy: Secondary | ICD-10-CM | POA: Diagnosis not present

## 2019-08-25 NOTE — Patient Instructions (Signed)
Thank you allowing the Chronic Care Management Team to be a part of your care! It was a pleasure speaking with you today!     CCM (Chronic Care Management) Team    Noreene Larsson RN, MSN, CCM Nurse Care Coordinator  [phone]   [name] PharmD  Clinical Pharmacist  [phone]   Eula Fried LCSW Clinical Social Worker [phone]  Visit Information  Goals Addressed            This Visit's Progress    PharmD - Medication Managment       CARE PLAN ENTRY (see longitudinal plan of care for additional care plan information)  Current Barriers:   Financial o Note patient has Partial Extra Help/LIS Subsidy  Limited vision  Chronic Disease Management support and education needs related to multiple chronic disease processes  Pharmacist Clinical Goal(s):   Over the next 30 days, patient will work with CM Pharmacist to address needs related to medication adherence and diabetes management  Interventions:  Perform chart review ? Patient seen by Cardiologist on 6/7. Advised to reduce his torsemide dose back to 60 mg daily  Follow up regarding foot pain.  ? Reports foot paint improved, since seen and treated by Podiatrist on 5/10 ? Reports continuing to take gabapentin 300 mg - 2 tablets twice daily for nerve pain. Denies using tramadol recently  Discuss importance of CBG control and monitoring o Reports patient taking:  [name] 38 untis daily  Trulicity 5.03 mg once weekly  Jardiance 10 mg once daily before breakfast o Patient reports recent AM CBGs ranging: 120s-140s ? Encourage continuing to monitor and keep log of CBGs   Discuss importance of medication adherence ? Note patient's wife manages weekly pillbox for patient ? [name] confirms patient taking torsemide as directed by Cardiologist  Discuss importance of continuing home monitoring of BP and daily weights, bringing these records with him to medical appointments and follow up with  Cardiology for readings outside of established parameters  Follow up regarding supply of patient assistance program medications. Reports has 1 injection of Trulicity remaining. Has ordered from program and notified that supply was shipped, going to PCP office, yesterday.  Patient Self Care Activities:   Check blood sugar, blood pressure and daily weight as directed by providers   Patient takes medications, with assistance of wife, as directed o Mrs. Loring manages patient's medications using weekly pillbox as adherence tool  Patient attends scheduled medical appointments  Please see past updates related to this goal by clicking on the "Past Updates" button in the selected goal         Patient verbalizes understanding of instructions provided today.   The care management team will reach out to the patient again over the next 30 days.   [name], PharmD, [place] [employer] [phone]

## 2019-08-25 NOTE — Progress Notes (Signed)
Daily Session Note  Patient Details  Name: Lucas Caldwell MRN: [id number] Date of Birth: [birth year] Referring Provider:     Cardiac Rehab from 05/20/2019 in [facility]  Referring Provider  Bensimhon      Encounter Date: 08/25/2019  Check In: Session Check In - 08/25/19 1402      Check-In   Supervising physician immediately available to respond to emergencies  See telemetry face sheet for immediately available ER MD    Location  [facility]-Cardiac & Pulmonary Rehab    Staff Present  Heath Lark, RN, BSN, CCRP;Melissa Caiola RDN, LDN;Laureen Owens Shark, BS, RRT, CPFT    Virtual Visit  No    Medication changes reported      No    Fall or balance concerns reported     No    Warm-up and Cool-down  Performed on first and last piece of equipment    Resistance Training Performed  Yes    VAD Patient?  No    PAD/SET Patient?  No      Pain Assessment   Currently in Pain?  No/denies          Social History   Tobacco Use  Smoking Status Former Smoker  . Packs/day: 1.75  . Years: 20.00  . Pack years: 35.00  . Types: Cigarettes  . Start date: 33  . Quit date: 53  . Years since quitting: 46.4  Smokeless Tobacco Former Systems developer    Goals Met:  Independence with exercise equipment Exercise tolerated well No report of cardiac concerns or symptoms  Goals Unmet:  Not Applicable  Comments: Pt able to follow exercise prescription today without complaint.  Will continue to monitor for progression.    Dr. Emily Filbert is Medical Director for [place] [name].

## 2019-08-25 NOTE — Chronic Care Management (AMB) (Signed)
Chronic Care Management   Follow Up Note   08/25/2019 Name: Lucas Caldwell MRN: [id number] DOB: 1933-08-08  Referred by: Lucas Hauser, DO Reason for referral : Chronic Care Management (Patient Phone Call)   Lucas Caldwell is a 86 y.o. year old male who is a primary care patient of Lucas Hauser, DO. The CCM team was consulted for assistance with chronic disease management and care coordination needs.    I reached out to Lucas Caldwell and his wife by phone today.   Review of patient status, including review of consultants reports, relevant laboratory and other test results, and collaboration with appropriate care team members and the patient's provider was performed as part of comprehensive patient evaluation and provision of chronic care management services.     Outpatient Encounter Medications as of 08/25/2019  Medication Sig Note  . Dulaglutide (TRULICITY) 7.34 KA/7.6OT SOPN Inject 0.75 mg into the skin once a week. (Patient taking differently: Inject 0.75 mg into the skin every Saturday. )   . empagliflozin (JARDIANCE) 10 MG TABS tablet Take 10 mg by mouth daily before breakfast.   . gabapentin (NEURONTIN) 300 MG capsule Take 2 capsules (600 mg total) by mouth 2 (two) times daily. Max dose = 1242m daily for kidney function   . Insulin Glargine ([name] KWIKPEN) 100 UNIT/ML SOPN Inject 38 Units into the skin daily.    .Marland Kitchentorsemide (DEMADEX) 20 MG tablet Take 3 tablets (60 mg total) by mouth daily.   .Marland KitchenACCU-CHEK AVIVA PLUS test strip Use to check blood sugar up to 2 times daily   . Accu-Chek Softclix Lancets lancets Use to check blood sugar up to 2 times daily.   .Marland Kitchenacetaminophen (TYLENOL) 500 MG tablet Take 1,000 mg by mouth every 6 (six) hours as needed for moderate pain or headache.    . Alcohol Swabs 70 % PADS Use when checking blood sugar up to 3x daily   . allopurinol (ZYLOPRIM) 100 MG tablet Take 1 tablet (100 mg total) by mouth daily.   .Marland Kitchenaspirin  81 MG chewable tablet Chew 81 mg by mouth daily.   . Blood Glucose Monitoring Suppl (ACCU-CHEK AVIVA PLUS) w/Device KIT Use to check blood sugar up to 2 times daily   . bosutinib (BOSULIF) 100 MG tablet Take 100 mg by mouth at bedtime as needed (tired/not feeling well). Take with food.  08/31/2018: Pt's wife states he only takes this as needed when he's "not feeling well"  . hydrALAZINE (APRESOLINE) 50 MG tablet Take 1 tablet (50 mg total) by mouth 3 (three) times daily.   . Insulin Pen Needle 31G X 5 MM MISC 31 g by Does not apply route as directed.   . isosorbide mononitrate (IMDUR) 60 MG 24 hr tablet Take 1.5 tablets (90 mg total) by mouth daily.   .Marland Kitchenlevothyroxine (SYNTHROID) 25 MCG tablet Take 1 tablet (25 mcg total) by mouth daily before breakfast.   . losartan (COZAAR) 25 MG tablet Take 1 tablet (25 mg total) by mouth daily.   . Menthol-Zinc Oxide (GOLD BOND EX) Apply 1 application topically daily as needed (muscle pain).   . metoprolol succinate (TOPROL-XL) 25 MG 24 hr tablet Take 1 tablet (25 mg total) by mouth daily.   . Multiple Vitamins-Minerals (MULTIVITAMIN ADULTS 50+ PO) Take 1 tablet by mouth every morning.   . nitroGLYCERIN (NITROSTAT) 0.4 MG SL tablet Place 1 tablet (0.4 mg total) under the tongue every 5 (five) minutes x 3 doses as needed  for chest pain.   [name] omeprazole (PRILOSEC) 20 MG capsule Take 20 mg by mouth daily.    [name] Place 1 drop into both eyes daily as needed (dry eyes).   . polyethylene glycol (MIRALAX / GLYCOLAX) 17 g packet Take 17 g by mouth daily as needed for mild constipation.    . potassium chloride SA (KLOR-CON) 20 MEQ tablet Take 1 tablet (20 mEq total) by mouth daily.   [name] PROVENTIL HFA 108 (90 Base) MCG/ACT inhaler Inhale 2 puffs into the lungs every 6 (six) hours as needed for wheezing or shortness of breath.   . ranolazine (RANEXA) 500 MG 12 hr tablet Take 1 tablet (500 mg total) by mouth 2 (two) times daily.   .  rosuvastatin (CRESTOR) 5 MG tablet Take 1 tablet (5 mg total) by mouth daily.   . traMADol (ULTRAM) 50 MG tablet Take 1-2 tablets (50-100 mg total) by mouth at bedtime as needed. (Patient not taking: Reported on 08/25/2019)   . warfarin (COUMADIN) 3 MG tablet Take 1 to 1 1/2 tablets by mouth daily as directed by the coumadin clinic    No facility-administered encounter medications on file as of 08/25/2019.    Goals Addressed            This Visit's Progress   . PharmD - Medication Managment       CARE PLAN ENTRY (see longitudinal plan of care for additional care plan information)  Current Barriers:  . Financial o Note patient has Partial Extra Help/LIS Subsidy . Limited vision . Chronic Disease Management support and education needs related to multiple chronic disease processes  Pharmacist Clinical Goal(s):  [name] Over the next 30 days, patient will work with CM Pharmacist to address needs related to medication adherence and diabetes management  Interventions: . Perform chart review ? Patient seen by Cardiologist on 6/7. Advised to reduce his torsemide dose back to 60 mg daily . Follow up regarding foot pain.  ? Reports foot paint improved, since seen and treated by Podiatrist on 5/10 ? Reports continuing to take gabapentin 300 mg - 2 tablets twice daily for nerve pain. Denies using tramadol recently . Discuss importance of CBG control and monitoring o Reports patient taking:  [name] 38 untis daily  Trulicity 8.33 mg once weekly  Jardiance 10 mg once daily before breakfast o Patient reports recent AM CBGs ranging: 120s-140s ? Encourage continuing to monitor and keep log of CBGs  . Discuss importance of medication adherence ? Note patient's wife manages weekly pillbox for patient ? [name] confirms patient taking torsemide as directed by Cardiologist . Discuss importance of continuing home monitoring of BP and daily weights, bringing these records with him to medical  appointments and follow up with Cardiology for readings outside of established parameters . Follow up regarding supply of patient assistance program medications. Reports has 1 injection of Trulicity remaining. Has ordered from program and notified that supply was shipped, going to PCP office, yesterday.  Patient Self Care Activities:  . Check blood sugar, blood pressure and daily weight as directed by providers  . Patient takes medications, with assistance of wife, as directed o Mrs. Borneman manages patient's medications using weekly pillbox as adherence tool . Patient attends scheduled medical appointments  Please see past updates related to this goal by clicking on the "Past Updates" button in the selected goal         Plan  The care management team will reach out  to the patient again over the next 30 days.   [name], PharmD, [place] [employer] [phone]

## 2019-08-27 ENCOUNTER — Telehealth: Payer: Self-pay | Admitting: Family Medicine

## 2019-08-27 NOTE — Telephone Encounter (Signed)
Patient's spouse advised to pick up the med's that was received.

## 2019-08-27 NOTE — Telephone Encounter (Unsigned)
Copied from [place] [phone]. Topic: General - Inquiry >> Aug 27, 2019 10:33 AM Mathis Bud wrote: Reason for CRM: Patient wife is calling to see if patient medication trulicity is in the office yet.  Wife would like a call back so she can go get medication Call back [phone]

## 2019-08-30 ENCOUNTER — Other Ambulatory Visit: Payer: Self-pay

## 2019-08-30 ENCOUNTER — Ambulatory Visit (INDEPENDENT_AMBULATORY_CARE_PROVIDER_SITE_OTHER): Payer: Medicare HMO

## 2019-08-30 DIAGNOSIS — Z5181 Encounter for therapeutic drug level monitoring: Secondary | ICD-10-CM

## 2019-08-30 DIAGNOSIS — I480 Paroxysmal atrial fibrillation: Secondary | ICD-10-CM

## 2019-08-30 DIAGNOSIS — Z7901 Long term (current) use of anticoagulants: Secondary | ICD-10-CM

## 2019-08-30 LAB — POCT INR: INR: 2.3 (ref 2.0–3.0)

## 2019-08-30 NOTE — Patient Instructions (Signed)
-   continue dosage warfarin of 1 TABLET EVERY DAY EXCEPT 1.5 TABLETS ON MONDAYS & FRIDAYS. - recheck INR in 5 weeks.

## 2019-08-31 DIAGNOSIS — I5022 Chronic systolic (congestive) heart failure: Secondary | ICD-10-CM | POA: Diagnosis not present

## 2019-09-01 ENCOUNTER — Encounter: Payer: Medicare HMO | Admitting: *Deleted

## 2019-09-01 ENCOUNTER — Encounter: Payer: Self-pay | Admitting: *Deleted

## 2019-09-01 ENCOUNTER — Other Ambulatory Visit: Payer: Self-pay

## 2019-09-01 DIAGNOSIS — I5042 Chronic combined systolic (congestive) and diastolic (congestive) heart failure: Secondary | ICD-10-CM

## 2019-09-01 DIAGNOSIS — Z87891 Personal history of nicotine dependence: Secondary | ICD-10-CM | POA: Diagnosis not present

## 2019-09-01 DIAGNOSIS — Z794 Long term (current) use of insulin: Secondary | ICD-10-CM | POA: Diagnosis not present

## 2019-09-01 DIAGNOSIS — I110 Hypertensive heart disease with heart failure: Secondary | ICD-10-CM | POA: Diagnosis not present

## 2019-09-01 DIAGNOSIS — E119 Type 2 diabetes mellitus without complications: Secondary | ICD-10-CM | POA: Diagnosis not present

## 2019-09-01 DIAGNOSIS — N400 Enlarged prostate without lower urinary tract symptoms: Secondary | ICD-10-CM | POA: Diagnosis not present

## 2019-09-01 DIAGNOSIS — Z79899 Other long term (current) drug therapy: Secondary | ICD-10-CM | POA: Diagnosis not present

## 2019-09-01 DIAGNOSIS — I5022 Chronic systolic (congestive) heart failure: Secondary | ICD-10-CM

## 2019-09-01 DIAGNOSIS — Z7982 Long term (current) use of aspirin: Secondary | ICD-10-CM | POA: Diagnosis not present

## 2019-09-01 DIAGNOSIS — Z7901 Long term (current) use of anticoagulants: Secondary | ICD-10-CM | POA: Diagnosis not present

## 2019-09-01 NOTE — Progress Notes (Signed)
Daily Session Note  Patient Details  Name: Lucas Caldwell MRN: [id number] Date of Birth: 1933/08/25 Referring Provider:     Cardiac Rehab from 05/20/2019 in [facility]  Referring Provider Bensimhon      Encounter Date: 09/01/2019  Check In:  Session Check In - 09/01/19 1358      Check-In   Supervising physician immediately available to respond to emergencies See telemetry face sheet for immediately available ER MD    Location [facility]    Staff Present Renita Papa, RN BSN;Melissa Caiola RDN, Rowe Pavy, BA, ACSM CEP, Exercise Physiologist    Virtual Visit No    Medication changes reported     No    Fall or balance concerns reported    No    Warm-up and Cool-down Performed on first and last piece of equipment    Resistance Training Performed Yes    VAD Patient? No    PAD/SET Patient? No      Pain Assessment   Currently in Pain? No/denies              Social History   Tobacco Use  Smoking Status Former Smoker  . Packs/day: 1.75  . Years: 20.00  . Pack years: 35.00  . Types: Cigarettes  . Start date: 67  . Quit date: 68  . Years since quitting: 46.4  Smokeless Tobacco Former Systems developer    Goals Met:  Independence with exercise equipment Exercise tolerated well No report of cardiac concerns or symptoms Strength training completed today  Goals Unmet:  Not Applicable  Comments: Pt able to follow exercise prescription today without complaint.  Will continue to monitor for progression.    Dr. Emily Filbert is Medical Director for [place] and [name].

## 2019-09-01 NOTE — Progress Notes (Signed)
Cardiac Individual Treatment Plan  Patient Details  Name: Lucas Caldwell MRN: [id number] Date of Birth: Aug 14, 1933 Referring Provider:     Cardiac Rehab from 05/20/2019 in [facility] Cardiac and Pulmonary Rehab  Referring Provider Bensimhon      Initial Encounter Date:    Cardiac Rehab from 05/20/2019 in [facility] Cardiac and Pulmonary Rehab  Date 05/20/19      Visit Diagnosis: Chronic combined systolic and diastolic congestive heart failure ([place])  Patient's Home Medications on Admission:  Current Outpatient Medications:  .  ACCU-CHEK AVIVA PLUS test strip, Use to check blood sugar up to 2 times daily, Disp: 200 each, Rfl: 3 .  Accu-Chek Softclix Lancets lancets, Use to check blood sugar up to 2 times daily., Disp: 200 each, Rfl: 3 .  acetaminophen (TYLENOL) 500 MG tablet, Take 1,000 mg by mouth every 6 (six) hours as needed for moderate pain or headache. , Disp: , Rfl:  .  Alcohol Swabs 70 % PADS, Use when checking blood sugar up to 3x daily, Disp: 100 each, Rfl: 11 .  allopurinol (ZYLOPRIM) 100 MG tablet, Take 1 tablet (100 mg total) by mouth daily., Disp: 90 tablet, Rfl: 3 .  aspirin 81 MG chewable tablet, Chew 81 mg by mouth daily., Disp: , Rfl:  .  Blood Glucose Monitoring Suppl (ACCU-CHEK AVIVA PLUS) w/Device KIT, Use to check blood sugar up to 2 times daily, Disp: 1 kit, Rfl: 0 .  bosutinib (BOSULIF) 100 MG tablet, Take 100 mg by mouth at bedtime as needed (tired/not feeling well). Take with food. , Disp: , Rfl:  .  Dulaglutide (TRULICITY) 8.11 XB/2.6OM SOPN, Inject 0.75 mg into the skin once a week. (Patient taking differently: Inject 0.75 mg into the skin every Saturday. ), Disp: 4 pen, Rfl: 0 .  empagliflozin (JARDIANCE) 10 MG TABS tablet, Take 10 mg by mouth daily before breakfast., Disp: 90 tablet, Rfl: 3 .  gabapentin (NEURONTIN) 300 MG capsule, Take 2 capsules (600 mg total) by mouth 2 (two) times daily. Max dose = 1255m daily for kidney function, Disp: 360 capsule, Rfl: 1 .   hydrALAZINE (APRESOLINE) 50 MG tablet, Take 1 tablet (50 mg total) by mouth 3 (three) times daily., Disp: 270 tablet, Rfl: 2 .  Insulin Glargine ([name] KWIKPEN) 100 UNIT/ML SOPN, Inject 38 Units into the skin daily. , Disp: , Rfl:  .  Insulin Pen Needle 31G X 5 MM MISC, 31 g by Does not apply route as directed., Disp: 100 each, Rfl: 5 .  isosorbide mononitrate (IMDUR) 60 MG 24 hr tablet, Take 1.5 tablets (90 mg total) by mouth daily., Disp: 135 tablet, Rfl: 2 .  levothyroxine (SYNTHROID) 25 MCG tablet, Take 1 tablet (25 mcg total) by mouth daily before breakfast., Disp: 90 tablet, Rfl: 1 .  losartan (COZAAR) 25 MG tablet, Take 1 tablet (25 mg total) by mouth daily., Disp: 90 tablet, Rfl: 1 .  Menthol-Zinc Oxide (GOLD BOND EX), Apply 1 application topically daily as needed (muscle pain)., Disp: , Rfl:  .  metoprolol succinate (TOPROL-XL) 25 MG 24 hr tablet, Take 1 tablet (25 mg total) by mouth daily., Disp: 90 tablet, Rfl: 1 .  Multiple Vitamins-Minerals (MULTIVITAMIN ADULTS 50+ PO), Take 1 tablet by mouth every morning., Disp: , Rfl:  .  nitroGLYCERIN (NITROSTAT) 0.4 MG SL tablet, Place 1 tablet (0.4 mg total) under the tongue every 5 (five) minutes x 3 doses as needed for chest pain., Disp: 25 tablet, Rfl: 4 .  omeprazole (PRILOSEC) 20 MG capsule,  Take 20 mg by mouth daily. , Disp: , Rfl:  .  Polyethyl Glycol-Propyl Glycol (SYSTANE OP), Place 1 drop into both eyes daily as needed (dry eyes)., Disp: , Rfl:  .  polyethylene glycol (MIRALAX / GLYCOLAX) 17 g packet, Take 17 g by mouth daily as needed for mild constipation. , Disp: , Rfl:  .  potassium chloride SA (KLOR-CON) 20 MEQ tablet, Take 1 tablet (20 mEq total) by mouth daily., Disp: 90 tablet, Rfl: 3 .  PROVENTIL HFA 108 (90 Base) MCG/ACT inhaler, Inhale 2 puffs into the lungs every 6 (six) hours as needed for wheezing or shortness of breath., Disp: , Rfl:  .  ranolazine (RANEXA) 500 MG 12 hr tablet, Take 1 tablet (500 mg total) by mouth 2  (two) times daily., Disp: 180 tablet, Rfl: 2 .  rosuvastatin (CRESTOR) 5 MG tablet, Take 1 tablet (5 mg total) by mouth daily., Disp: 90 tablet, Rfl: 3 .  torsemide (DEMADEX) 20 MG tablet, Take 3 tablets (60 mg total) by mouth daily., Disp: 180 tablet, Rfl: 2 .  traMADol (ULTRAM) 50 MG tablet, Take 1-2 tablets (50-100 mg total) by mouth at bedtime as needed. (Patient not taking: Reported on 08/25/2019), Disp: 30 tablet, Rfl: 2 .  warfarin (COUMADIN) 3 MG tablet, Take 1 to 1 1/2 tablets by mouth daily as directed by the coumadin clinic, Disp: 120 tablet, Rfl: 0  Past Medical History: Past Medical History:  Diagnosis Date  . Arthritis    "probably in his legs" (08/15/2016)  . Blind right eye   . BPH (benign prostatic hyperplasia)   . Breast asymmetry    Left breast is larger, present for several years.  [name] CAD (coronary artery disease)    a. Cath in the late 90's - reportedly ok;  b. 2014 s/p stenting x 2 @ [facility]; c 08/19/16 Cath/PCI with DES -> RCA, plan to treat LM 70% medically. Seen by surgery and felt to be too high risk for CABG; d. 08/2018 Cath: LM 70 (iFR 0.86), LAD 20ost, 40p, 50/40m D1 70, LCX patent stent, OM1 30, RCA patent stent, 442mSR, 40d, RPDA 30. PCWP 8. CO/CI 4.0/2.1.  . Marland Kitchenhronic combined systolic (congestive) and diastolic (congestive) heart failure ([facility]   a. Previously reduced EF-->50% by echo in 2012;  b. 06/2015 Echo: EF 50-55%  c. 07/2016 Echo: EF 45-50%; d. 12/2017 Echo: EF 55%, Gr1 DD, mild MR; e. 08/2018 Echo: EF 35-40%, mildly dil PA.  . Marland Kitchenhronic kidney disease (CKD), stage IV (severe) ([facility]  . CML (chronic myelocytic leukemia) ([facility]  . GERD (gastroesophageal reflux disease)   . GIB (gastrointestinal bleeding)    a. 12/2017 3 unit PRBC GIB in setting of coumadin-->Endo/colon multiple duodenal ulcers and a single bleeding ulcer in the proximal ascending colon status post hemostatic clipping x2.  . Gout   . Hyperlipemia   . Hypertension   . Hypothyroidism   . Iron  deficiency anemia   . Ischemic cardiomyopathy    a. Previously reduced EF-->50% by echo in 2012;  b. 06/2015 Echo: EF 50-55%, Gr2 DD, mild MR, mildly dil LA, Ao sclerosis, mild TR;  c. 07/2016 Echo: EF 45-50%; d. 12/2017 Echo: EF 55%, Gr1 DD, mild MR; e. 08/2018 Echo: EF 35-40%.  . Migraine    "in the 1960s" (08/15/2016)  . PAF (paroxysmal atrial fibrillation) (HCC)    a. ? Dx 2014-->s/p DCCV;  b. CHA2DS2VASc = 6--> Coumadin.  . Prostate cancer ([facility]  . RBBB   .  Type II diabetes mellitus (HCC)     Tobacco Use: Social History   Tobacco Use  Smoking Status Former Smoker  . Packs/day: 1.75  . Years: 20.00  . Pack years: 35.00  . Types: Cigarettes  . Start date: 71  . Quit date: 65  . 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   Exercise Prescription Goal: Initial exercise prescription builds to 30-45 minutes a day of aerobic activity, 2-3 days per week.  Home exercise guidelines will be given to patient during program as part of exercise prescription that the participant will acknowledge.   Education: Aerobic Exercise & Resistance Training: - Gives group verbal and written instruction on the various components of exercise. Focuses on aerobic and resistive training programs and the benefits of this training and how to safely progress through these programs..   Education: Exercise & Equipment Safety: -  Individual verbal instruction and demonstration of equipment use and safety with use of the equipment.   Cardiac Rehab from 05/20/2019 in [facility] Cardiac and Pulmonary Rehab  Date 05/20/19  Educator AS  Instruction Review Code 1- Verbalizes Understanding      Education: Exercise Physiology & General Exercise Guidelines: - Group verbal and written instruction with models to review the exercise physiology of the cardiovascular system and associated critical values. Provides general exercise guidelines with specific guidelines to those with heart or lung disease.    Education: Flexibility, Balance, Mind/Body Relaxation: Provides group verbal/written instruction on the benefits of flexibility and balance training, including mind/body exercise modes such as yoga, pilates and tai chi.  Demonstration and skill practice provided.   Activity Barriers & Risk Stratification:  Activity Barriers & Cardiac Risk Stratification - 05/19/19 0913      Activity Barriers & Cardiac Risk Stratification   Activity Barriers Arthritis;Joint Problems;Deconditioning;Muscular Weakness;Shortness of Breath;Assistive Device   knee pain, uses walker   Cardiac Risk Stratification High           6 Minute Walk:  6 Minute Walk    Row Name 05/20/19 1539         6 Minute Walk   Distance 715 feet     Walk Time 5.35 minutes     # of Rest Breaks 1     MPH 1.54     METS 1.14     RPE 15     Perceived Dyspnea  2     VO2 Peak 4     Symptoms Yes (comment)     Comments legs "going to sleep" from knees down     Resting HR 95 bpm     Resting BP 114/70     Resting Oxygen Saturation  98 %     Exercise Oxygen Saturation  during 6 min walk 92 %     Max Ex.  HR 107 bpm     Max Ex. BP 154/68     2 Minute Post BP 124/68            Oxygen Initial Assessment:   Oxygen Re-Evaluation:   Oxygen Discharge (Final Oxygen Re-Evaluation):   Initial Exercise Prescription:  Initial Exercise Prescription - 05/20/19 1500        Date of Initial Exercise RX and Referring Provider   Date 05/20/19    Referring Provider Bensimhon      Treadmill   MPH 1    Grade 0    Minutes 15      REL-XR   Level 1    Speed 50    Minutes 15    METs 1      Prescription Details   Frequency (times per week) 3    Duration Progress to 30 minutes of continuous aerobic without signs/symptoms of physical distress      Intensity   THRR 40-80% of Max Heartrate 111-128    Ratings of Perceived Exertion 11-13    Perceived Dyspnea 0-4      Resistance Training   Training Prescription Yes    Weight 3 lb    Reps 10-15           Perform Capillary Blood Glucose checks as needed.  Exercise Prescription Changes:  Exercise Prescription Changes    Row Name 05/20/19 1500 06/09/19 1100 06/22/19 1500 07/07/19 1100 07/20/19 1500     Response to Exercise   Blood Pressure (Admit) 114/70 110/70 140/82 130/64 126/64   Blood Pressure (Exercise) 154/68 120/80 130/70 140/72 128/74   Blood Pressure (Exit) 124/68 122/82 124/58 124/62 114/68   Heart Rate (Admit) 95 bpm 56 bpm 87 bpm 95 bpm 94 bpm   Heart Rate (Exercise) 107 bpm 126 bpm 116 bpm 127 bpm 116 bpm   Heart Rate (Exit) 90 bpm 109 bpm 95 bpm 111 bpm 97 bpm   Oxygen Saturation (Admit) 98 % -- -- -- --   Oxygen Saturation (Exercise) 92 % -- -- -- --   Oxygen Saturation (Exit) 96 % -- -- -- --   Rating of Perceived Exertion (Exercise) _0 Perceived Dyspnea (Exercise) 2 -- -- -- --   Symptoms legs "going to sleep" none none none none   Comments L shoulder old injury second full day of exericse -- -- --   Duration -- Progress to 30 minutes of  aerobic without signs/symptoms of physical distress Progress to 30 minutes of  aerobic without signs/symptoms of physical distress Progress to 30 minutes of  aerobic without signs/symptoms of physical distress Progress to 30 minutes of  aerobic without signs/symptoms of physical distress   Intensity -- THRR unchanged THRR unchanged  THRR unchanged THRR unchanged     Progression   Progression -- Continue to progress workloads to maintain intensity without signs/symptoms of physical distress. Continue to progress workloads to maintain intensity without signs/symptoms of physical distress. Continue to progress workloads to maintain intensity without signs/symptoms of physical distress. Continue to progress workloads to maintain intensity without signs/symptoms of physical distress.   Average METs -- 2 1.3 2.49 2.17     Resistance Training   Training Prescription -- Yes Yes Yes Yes   Weight -- 3 lb  3 lb  3 lb 4 lb   Reps -- 10-15 10-15 10-15 10-15     Interval Training   Interval Training -- No No No No     Treadmill  MPH -- 1.3 -- 1.4 1.4   Grade -- 0 -- 0.5 0.5   Minutes -- 15 -- 15 15   METs -- 2 -- 2.17 2.17     REL-XR   Level -- _0 Speed -- -- 50 -- 50   Minutes -- _1 METs -- 2 1.3 2.2 --     T5 Nustep   Level -- -- -- 2 --   Minutes -- -- -- 15 --   METs -- -- -- 3.1 --   Row Name 08/04/19 0800 08/18/19 1300           Response to Exercise   Blood Pressure (Admit) 122/80 132/66      Blood Pressure (Exercise) 140/64 146/70      Blood Pressure (Exit) 122/72 140/76      Heart Rate (Admit) 94 bpm 111 bpm      Heart Rate (Exercise) 114 bpm 119 bpm      Heart Rate (Exit) 99 bpm 95 bpm      Rating of Perceived Exertion (Exercise) 13 15      Symptoms none none      Duration Continue with 30 min of aerobic exercise without signs/symptoms of physical distress. Continue with 30 min of aerobic exercise without signs/symptoms of physical distress.      Intensity THRR unchanged THRR unchanged        Progression   Progression Continue to progress workloads to maintain intensity without signs/symptoms of physical distress. Continue to progress workloads to maintain intensity without signs/symptoms of physical distress.      Average METs 2.1 2.3        Resistance Training   Training  Prescription Yes Yes      Weight 4 lb 4 lb      Reps 10-15 10-15        Interval Training   Interval Training No No        Treadmill   MPH -- 1.6      Grade -- 0.5      Minutes -- 15      METs -- 2.17        NuStep   Level 3 3      SPM -- 80      Minutes 30 15      METs 2.1 2.4             Exercise Comments:  Exercise Comments    Row Name 06/02/19 1353           Exercise Comments First full day of exercise!  Patient was oriented to gym and equipment including functions, settings, policies, and procedures.  Patient's individual exercise prescription and treatment plan were reviewed.  All starting workloads were established based on the results of the 6 minute walk test done at initial orientation visit.  The plan for exercise progression was also introduced and progression will be customized based on patient's performance and goals.              Exercise Goals and Review:  Exercise Goals    Row Name 05/20/19 1544             Exercise Goals   Increase Physical Activity Yes       Intervention Provide advice, education, support and counseling about physical activity/exercise needs.;Develop an individualized exercise prescription for aerobic and resistive training based on initial evaluation findings, risk stratification, comorbidities and participant's personal goals.  Expected Outcomes Short Term: Attend rehab on a regular basis to increase amount of physical activity.;Long Term: Add in home exercise to make exercise part of routine and to increase amount of physical activity.;Long Term: Exercising regularly at least 3-5 days a week.       Increase Strength and Stamina Yes       Intervention Provide advice, education, support and counseling about physical activity/exercise needs.;Develop an individualized exercise prescription for aerobic and resistive training based on initial evaluation findings, risk stratification, comorbidities and participant's personal goals.        Expected Outcomes Short Term: Increase workloads from initial exercise prescription for resistance, speed, and METs.;Short Term: Perform resistance training exercises routinely during rehab and add in resistance training at home;Long Term: Improve cardiorespiratory fitness, muscular endurance and strength as measured by increased METs and functional capacity (6MWT)       Able to understand and use rate of perceived exertion (RPE) scale Yes       Intervention Provide education and explanation on how to use RPE scale       Expected Outcomes Short Term: Able to use RPE daily in rehab to express subjective intensity level;Long Term:  Able to use RPE to guide intensity level when exercising independently       Able to understand and use Dyspnea scale Yes       Intervention Provide education and explanation on how to use Dyspnea scale       Expected Outcomes Short Term: Able to use Dyspnea scale daily in rehab to express subjective sense of shortness of breath during exertion;Long Term: Able to use Dyspnea scale to guide intensity level when exercising independently       Knowledge and understanding of Target Heart Rate Range (THRR) Yes       Intervention Provide education and explanation of THRR including how the numbers were predicted and where they are located for reference       Expected Outcomes Short Term: Able to state/look up THRR;Short Term: Able to use daily as guideline for intensity in rehab;Long Term: Able to use THRR to govern intensity when exercising independently       Able to check pulse independently Yes       Intervention Provide education and demonstration on how to check pulse in carotid and radial arteries.;Review the importance of being able to check your own pulse for safety during independent exercise       Expected Outcomes Short Term: Able to explain why pulse checking is important during independent exercise;Long Term: Able to check pulse independently and accurately        Understanding of Exercise Prescription Yes       Intervention Provide education, explanation, and written materials on patient's individual exercise prescription       Expected Outcomes Short Term: Able to explain program exercise prescription;Long Term: Able to explain home exercise prescription to exercise independently              Exercise Goals Re-Evaluation :  Exercise Goals Re-Evaluation    Row Name 06/02/19 1353 06/09/19 1109 06/22/19 1553 06/30/19 1343 07/07/19 1131     Exercise Goal Re-Evaluation   Exercise Goals Review Able to understand and use rate of perceived exertion (RPE) scale;Understanding of Exercise Prescription;Knowledge and understanding of Target Heart Rate Range (THRR) Increase Physical Activity;Increase Strength and Stamina;Understanding of Exercise Prescription Increase Physical Activity;Increase Strength and Stamina;Able to understand and use rate of perceived exertion (RPE) scale;Able to understand and use Dyspnea  scale;Knowledge and understanding of Target Heart Rate Range (THRR);Able to check pulse independently;Understanding of Exercise Prescription Increase Physical Activity;Increase Strength and Stamina;Able to understand and use rate of perceived exertion (RPE) scale;Able to understand and use Dyspnea scale;Knowledge and understanding of Target Heart Rate Range (THRR);Able to check pulse independently;Understanding of Exercise Prescription Increase Physical Activity;Increase Strength and Stamina;Understanding of Exercise Prescription   Comments Reviewed RPE scale, THR and program prescription with pt today.  Pt voiced understanding and was given a copy of goals to take home. Lucas Caldwell is off to a good start in rehab.  He has completed his first two full days of exericse.  [name] now up to 1.3 mph on the treadmill.  We will continue to monitor his progress. Lucas Caldwell only did the XR today.  He did not feel up to the TM.  Staff will monitor progress Lucas Caldwell is now on the  treadmill again. Lucas Caldwell reports walking at home for exercise; 1 mile 3x/week (RPE12). Lucas Caldwell is doing well in rehab.  [name] now up to 3.1 METs on the NuStep.  We will continue to monitor his progress.   Expected Outcomes Short: Use RPE daily to regulate intensity. Long: Follow program prescription in THR. Short: Continue to attend rehab regularly  Long: Continue to follow program prescription. Short: attend consistently Long: improve overall stamina Short: attend consistently, add some resistence at home Long: improve overall stamina Short: Continue to improve walking on treadmill  Long; Continue to improve stamina.   [place] Name 07/20/19 1536 08/02/19 1542 08/18/19 1316         Exercise Goal Re-Evaluation   Exercise Goals Review Increase Physical Activity;Increase Strength and Stamina;Able to understand and use rate of perceived exertion (RPE) scale;Able to understand and use Dyspnea scale;Knowledge and understanding of Target Heart Rate Range (THRR);Able to check pulse independently;Understanding of Exercise Prescription Increase Physical Activity;Increase Strength and Stamina;Understanding of Exercise Prescription Increase Physical Activity;Increase Strength and Stamina;Understanding of Exercise Prescription     Comments Lucas Caldwell works in [employer] range when he attends.  he has increased to 4 lb weights for strength work Lucas Caldwell returned to rehab today after missing since 4/28.  He had been out after debriment of a diabetic ulcer.  He did well today on the equipment and particiapated in weights as well.  We will continue to monitor his progress. Lucas Caldwell has progressed well since returning.  He has increased speed to 1.6 on TM. Staff will monitor progress.     Expected Outcomes Short: continue to attend consistently Long:  improve stamina and MET level Short: Return to improved attendance Long: Continue to improve stamina. Short: attend class consistently Long: increase stamina            Discharge  Exercise Prescription (Final Exercise Prescription Changes):  Exercise Prescription Changes - 08/18/19 1300      Response to Exercise   Blood Pressure (Admit) 132/66    Blood Pressure (Exercise) 146/70    Blood Pressure (Exit) 140/76    Heart Rate (Admit) 111 bpm    Heart Rate (Exercise) 119 bpm    Heart Rate (Exit) 95 bpm    Rating of Perceived Exertion (Exercise) 15    Symptoms none    Duration Continue with 30 min of aerobic exercise without signs/symptoms of physical distress.    Intensity THRR unchanged      Progression   Progression Continue to progress workloads to maintain intensity without signs/symptoms of physical distress.    Average METs 2.3  Resistance Training   Training Prescription Yes    Weight 4 lb    Reps 10-15      Interval Training   Interval Training No      Treadmill   MPH 1.6    Grade 0.5    Minutes 15    METs 2.17      NuStep   Level 3    SPM 80    Minutes 15    METs 2.4           Nutrition:  Target Goals: Understanding of nutrition guidelines, daily intake of sodium <1532m, cholesterol <2024m calories 30% from fat and 7% or less from saturated fats, daily to have 5 or more servings of fruits and vegetables.  Education: Controlling Sodium/Reading Food Labels -Group verbal and written material supporting the discussion of sodium use in heart healthy nutrition. Review and explanation with models, verbal and written materials for utilization of the food label.   Education: General Nutrition Guidelines/Fats and Fiber: -Group instruction provided by verbal, written material, models and posters to present the general guidelines for heart healthy nutrition. Gives an explanation and review of dietary fats and fiber.   Biometrics:  Pre Biometrics - 05/20/19 1545      Pre Biometrics   Height _0  (1.702 m)    Weight 206 lb (93.4 kg)    BMI (Calculated) 32.26            Nutrition Therapy Plan and Nutrition Goals:  Nutrition  Therapy & Goals - 05/24/19 0904      Nutrition Therapy   Diet HH, Low Na, DM    Drug/Food Interactions Coumadin/Vit K    Protein (specify units) 80-85g    Fiber 30 grams    Whole Grain Foods 3 servings    Saturated Fats 12 max. grams    Fruits and Vegetables 5 servings/day    Sodium 1.5 grams      Personal Nutrition Goals   Nutrition Goal ST: have one protein food at each meal. Small frequent meals. LT: improving SOB and DOE. 4/10    Comments Pt reports needing help. SOB with eating. Not much appetite. eggs and grits in am. turnip greens and green beans. Canned peaches. Discussed protein sources with wife and pt. Discussed how right now the focus is on protein and calorie needs as pt is not eating due to SOB, but discussed some HH and DM options as well. Wife goignt o try BBG Chicken. Pt reports not eating many foods high in protein. Fried chicken legs last night with some peaches, BG this morning was 188, usually around 130-150. BG stable throught the day. Discussed HH, DM friendly eating. Discussed protein and calorie needs and how to meet them with SOB such as small frequent meals. Hard for pt to hear, will talk more during class time in person.      Intervention Plan   Intervention Prescribe, educate and counsel regarding individualized specific dietary modifications aiming towards targeted core components such as weight, hypertension, lipid management, diabetes, heart failure and other comorbidities.;Nutrition handout(s) given to patient.    Expected Outcomes Short Term Goal: Understand basic principles of dietary content, such as calories, fat, sodium, cholesterol and nutrients.;Short Term Goal: A plan has been developed with personal nutrition goals set during dietitian appointment.;Long Term Goal: Adherence to prescribed nutrition plan.           Nutrition Assessments:  Nutrition Assessments - 05/20/19 1652      MEDFICTS Scores  Pre Score 46           MEDIFICTS Score Key:           ?70 Need to make dietary changes          40-70 Heart Healthy Diet         ? 40 Therapeutic Level Cholesterol Diet  Nutrition Goals Re-Evaluation:  Nutrition Goals Re-Evaluation    Lucas Caldwell Name 06/10/19 1348 08/18/19 1409           Goals   Nutrition Goal ST: have one protein food at each meal. Small frequent meals. LT: improving SOB and DOE. 4/10 --      Comment Pt's wife [name] will make all of his meals for him. Wife reports making a protein  food at each meal, mostly chicken. clarified what foods are good sources of protein, wife voiced understanding. Reiterated importance of getting enough protein intake to pt and small frequent meals for his SOB. Pt reports still having SOB. Pt and wife did not recall initial RD consult, will continue to follow up. Lucas Caldwell has been having more slamon and fish for protein.      Expected Outcome ST: have one protein food at each meal. Small frequent meals. LT: improving SOB and DOE. 4/10 Short: continue having quality lean protein Long:  maintain heart healthy eating             Nutrition Goals Discharge (Final Nutrition Goals Re-Evaluation):  Nutrition Goals Re-Evaluation - 08/18/19 1409      Goals   Comment Lucas Caldwell has been having more slamon and fish for protein.    Expected Outcome Short: continue having quality lean protein Long:  maintain heart healthy eating           Psychosocial: Target Goals: Acknowledge presence or absence of significant depression and/or stress, maximize coping skills, provide positive support system. Participant is able to verbalize types and ability to use techniques and skills needed for reducing stress and depression.   Education: Depression - Provides group verbal and written instruction on the correlation between heart/lung disease and depressed mood, treatment options, and the stigmas associated with seeking treatment.   Education: Sleep Hygiene -Provides group verbal and written instruction about how  sleep can affect your health.  Define sleep hygiene, discuss sleep cycles and impact of sleep habits. Review good sleep hygiene tips.     Education: Stress and Anxiety: - Provides group verbal and written instruction about the health risks of elevated stress and causes of high stress.  Discuss the correlation between heart/lung disease and anxiety and treatment options. Review healthy ways to manage with stress and anxiety.    Initial Review & Psychosocial Screening:  Initial Psych Review & Screening - 05/19/19 0914      Initial Review   Current issues with Current Stress Concerns    Source of Stress Concerns Chronic Illness;Unable to participate in former interests or hobbies;Unable to perform yard/household activities    Comments Wants more energy to do more, less SOB, wearing oxygen and using cane      Family Dynamics   Good Support System? Yes   wife, son, daughter     Barriers   Psychosocial barriers to participate in program Psychosocial barriers identified (see note);The patient should benefit from training in stress management and relaxation.      Screening Interventions   Interventions Encouraged to exercise;To provide support and resources with identified psychosocial needs;Provide feedback about the scores to participant    Expected Outcomes  Short Term goal: Utilizing psychosocial counselor, staff and physician to assist with identification of specific Stressors or current issues interfering with healing process. Setting desired goal for each stressor or current issue identified.;Long Term Goal: Stressors or current issues are controlled or eliminated.;Short Term goal: Identification and review with participant of any Quality of Life or Depression concerns found by scoring the questionnaire.;Long Term goal: The participant improves quality of Life and PHQ9 Scores as seen by post scores and/or verbalization of changes           Quality of Life Scores:   Quality of Life -  05/20/19 1545      Quality of Life   Select Quality of Life      Quality of Life Scores   Health/Function Pre 20.14 %    Socioeconomic Pre 30 %    Psych/Spiritual Pre 30 %    Family Pre 30 %    GLOBAL Pre 25.94 %          Scores of 19 and below usually indicate a poorer quality of life in these areas.  A difference of  2-3 points is a clinically meaningful difference.  A difference of 2-3 points in the total score of the Quality of Life Index has been associated with significant improvement in overall quality of life, self-image, physical symptoms, and general health in studies assessing change in quality of life.  PHQ-9: Recent Review Flowsheet Data    Depression screen [facility] 2/9 05/20/2019 02/02/2019 11/24/2018 11/04/2018 08/24/2018   Decreased Interest 1 0 0 0 0   Down, Depressed, Hopeless 2 0 0 0 0   PHQ - 2 Score 3 0 0 0 0   Altered sleeping 2 0 - - -   Tired, decreased energy 3 0 - - -   Change in appetite 2 0 - - -   Feeling bad or failure about yourself  2 0 - - -   Trouble concentrating 2 0 - - -   Moving slowly or fidgety/restless 2 0 - - -   Suicidal thoughts 0 0 - - -   PHQ-9 Score 16 0 - - -   Difficult doing work/chores Somewhat difficult - - - -     Interpretation of Total Score  Total Score Depression Severity:  1-4 = Minimal depression, 5-9 = Mild depression, 10-14 = Moderate depression, 15-19 = Moderately severe depression, 20-27 = Severe depression   Psychosocial Evaluation and Intervention:  Psychosocial Evaluation - 06/30/19 1345      Psychosocial Evaluation & Interventions   Interventions Encouraged to exercise with the program and follow exercise prescription;Stress management education    Comments Lucas Caldwell reports he is less stressed now that he has been coming to class and exercising at home. ADLs are still hard, but they are manageable; they have improved since starting rehab.    Expected Outcomes Short: Attend rehab regularly to build stamina and improve  breating. Long:Improve stamina.    Continue Psychosocial Services  Follow up required by staff           Psychosocial Re-Evaluation:  Psychosocial Re-Evaluation    [place] Name 08/18/19 1408             Psychosocial Re-Evaluation   Current issues with Current Stress Concerns       Comments Lucas Caldwell says he sleeps pretty well and has no stress concerns       Expected Outcomes Short: continue to exercise Long: maintai positive outlook  Psychosocial Discharge (Final Psychosocial Re-Evaluation):  Psychosocial Re-Evaluation - 08/18/19 1408      Psychosocial Re-Evaluation   Current issues with Current Stress Concerns    Comments Lucas Caldwell says he sleeps pretty well and has no stress concerns    Expected Outcomes Short: continue to exercise Long: maintai positive outlook           Vocational Rehabilitation: Provide vocational rehab assistance to qualifying candidates.   Vocational Rehab Evaluation & Intervention:  Vocational Rehab - 05/19/19 0913      Initial Vocational Rehab Evaluation & Intervention   Assessment shows need for Vocational Rehabilitation No   retired          Education: Education Goals: Education classes will be provided on a variety of topics geared toward better understanding of heart health and risk factor modification. Participant will state understanding/return demonstration of topics presented as noted by education test scores.  Learning Barriers/Preferences:  Learning Barriers/Preferences - 05/19/19 0914      Learning Barriers/Preferences   Learning Barriers Sight;Hearing   wears glasses, HOH   Learning Preferences None           General Cardiac Education Topics:  AED/CPR: - Group verbal and written instruction with the use of models to demonstrate the basic use of the AED with the basic ABC's of resuscitation.   Anatomy & Physiology of the Heart: - Group verbal and written instruction and models provide basic cardiac anatomy  and physiology, with the coronary electrical and arterial systems. Review of Valvular disease and Heart Failure   Cardiac Procedures: - Group verbal and written instruction to review commonly prescribed medications for heart disease. Reviews the medication, class of the drug, and side effects. Includes the steps to properly store meds and maintain the prescription regimen. (beta blockers and nitrates)   Cardiac Medications I: - Group verbal and written instruction to review commonly prescribed medications for heart disease. Reviews the medication, class of the drug, and side effects. Includes the steps to properly store meds and maintain the prescription regimen.   Cardiac Medications II: -Group verbal and written instruction to review commonly prescribed medications for heart disease. Reviews the medication, class of the drug, and side effects. (all other drug classes)    Go Sex-Intimacy & Heart Disease, Get SMART - Goal Setting: - Group verbal and written instruction through game format to discuss heart disease and the return to sexual intimacy. Provides group verbal and written material to discuss and apply goal setting through the application of the S.M.A.R.T. Method.   Other Matters of the Heart: - Provides group verbal, written materials and models to describe Stable Angina and Peripheral Artery. Includes description of the disease process and treatment options available to the cardiac patient.   Infection Prevention: - Provides verbal and written material to individual with discussion of infection control including proper hand washing and proper equipment cleaning during exercise session.   Cardiac Rehab from 05/20/2019 in [facility] Cardiac and Pulmonary Rehab  Date 05/20/19  Educator AS  Instruction Review Code 1- Verbalizes Understanding      Falls Prevention: - Provides verbal and written material to individual with discussion of falls prevention and safety.   Cardiac Rehab from  05/20/2019 in [facility] Cardiac and Pulmonary Rehab  Date 05/20/19  Educator AS  Instruction Review Code 1- Verbalizes Understanding      Other: -Provides group and verbal instruction on various topics (see comments)   Knowledge Questionnaire Score:  Knowledge Questionnaire Score - 05/20/19 1546  Knowledge Questionnaire Score   Pre Score 22/26           Core Components/Risk Factors/Patient Goals at Admission:  Personal Goals and Risk Factors at Admission - 05/20/19 1547      Core Components/Risk Factors/Patient Goals on Admission    Weight Management Yes;Weight Loss;Obesity    Intervention Weight Management: Develop a combined nutrition and exercise program designed to reach desired caloric intake, while maintaining appropriate intake of nutrient and fiber, sodium and fats, and appropriate energy expenditure required for the weight goal.;Weight Management: Provide education and appropriate resources to help participant work on and attain dietary goals.;Weight Management/Obesity: Establish reasonable short term and long term weight goals.;Obesity: Provide education and appropriate resources to help participant work on and attain dietary goals.    Admit Weight 206 lb (93.4 kg)    Goal Weight: Short Term 196 lb (88.9 kg)    Goal Weight: Long Term 186 lb (84.4 kg)    Expected Outcomes Short Term: Continue to assess and modify interventions until short term weight is achieved;Long Term: Adherence to nutrition and physical activity/exercise program aimed toward attainment of established weight goal;Weight Loss: Understanding of general recommendations for a balanced deficit meal plan, which promotes 1-2 lb weight loss per week and includes a negative energy balance of [phone] kcal/d;Understanding recommendations for meals to include 15-35% energy as protein, 25-35% energy from fat, 35-60% energy from carbohydrates, less than 213m of dietary cholesterol, 20-35 gm of total fiber  daily;Understanding of distribution of calorie intake throughout the day with the consumption of 4-5 meals/snacks           Education:Diabetes - Individual verbal and written instruction to review signs/symptoms of diabetes, desired ranges of glucose level fasting, after meals and with exercise. Acknowledge that pre and post exercise glucose checks will be done for 3 sessions at entry of program.   Education: Know Your Numbers and Risk Factors: -Group verbal and written instruction about important numbers in your health.  Discussion of what are risk factors and how they play a role in the disease process.  Review of Cholesterol, Blood Pressure, Diabetes, and BMI and the role they play in your overall health.   Core Components/Risk Factors/Patient Goals Review:   Goals and Risk Factor Review    Row Name 06/30/19 1347 08/18/19 1407           Core Components/Risk Factors/Patient Goals Review   Personal Goals Review Weight Management/Obesity;Heart Failure;Improve shortness of breath with ADL's;Stress;Hypertension Weight Management/Obesity;Heart Failure;Improve shortness of breath with ADL's;Stress;Hypertension      Review Lucas Caldwell taking his medications as directed. [name] managing his stress with exercise and his support system. He reports improving with ADLs. WShahcontinues to take meds as directed.  [name] walking some outside of class.  He says he is more limber and feels better since starting HT.      Expected Outcomes ST: continue taking medications as directed, continue managing stress levels, continue with exercise and nutrition regimen Short:  continue with current program  Long: manage risk factors and HF             Core Components/Risk Factors/Patient Goals at Discharge (Final Review):   Goals and Risk Factor Review - 08/18/19 1407      Core Components/Risk Factors/Patient Goals Review   Personal Goals Review Weight Management/Obesity;Heart Failure;Improve shortness of  breath with ADL's;Stress;Hypertension    Review WJonavincontinues to take meds as directed.  [name] walking some outside of class.  He says he is more limber and feels better since starting HT.    Expected Outcomes Short:  continue with current program  Long: manage risk factors and HF           ITP Comments:  ITP Comments    Row Name 05/19/19 0936 05/24/19 0936 06/02/19 1353 06/09/19 0602 07/07/19 0528   ITP Comments Completed virtual orientation today.  EP evaluation is scheduled for Thursday 3/4 at 2pm.  Documentation for diagnosis can be found in [facility] encounter 05/14/19.  Pt had oriented on 02/26/19 but never came in for his walk test for various reasons.  Now he is coming in for Cardiac Rehab. Completed Initial RD Eval First full day of exercise!  Patient was oriented to gym and equipment including functions, settings, policies, and procedures.  Patient's individual exercise prescription and treatment plan were reviewed.  All starting workloads were established based on the results of the 6 minute walk test done at initial orientation visit.  The plan for exercise progression was also introduced and progression will be customized based on patient's performance and goals. 30 day chart review completed. ITP sent to Dr Zachery Dakins Medical Director, for review,changes as needed and signature. Continue with ITP if no changes requested  New to program 30 Day review completed. Medical Director review done, changes made as directed,and approval shown by signature of Market researcher.   Marceline Name 08/04/19 0541 09/01/19 0543         ITP Comments 30 Day review completed. ITP review done, changes made as directed,and approval shown by signature of  Scientist, research (life sciences). 30 Day review completed. Medical Director ITP review done, changes made as directed, and signed approval by Medical Director.             Comments: 30 Day review completed. Medical Director ITP review done, changes made as  directed, and signed approval by Medical Director.

## 2019-09-02 ENCOUNTER — Other Ambulatory Visit: Payer: Self-pay

## 2019-09-02 ENCOUNTER — Encounter: Payer: Medicare HMO | Admitting: *Deleted

## 2019-09-02 DIAGNOSIS — Z79899 Other long term (current) drug therapy: Secondary | ICD-10-CM | POA: Diagnosis not present

## 2019-09-02 DIAGNOSIS — I5022 Chronic systolic (congestive) heart failure: Secondary | ICD-10-CM

## 2019-09-02 DIAGNOSIS — E119 Type 2 diabetes mellitus without complications: Secondary | ICD-10-CM | POA: Diagnosis not present

## 2019-09-02 DIAGNOSIS — I5042 Chronic combined systolic (congestive) and diastolic (congestive) heart failure: Secondary | ICD-10-CM

## 2019-09-02 DIAGNOSIS — Z7982 Long term (current) use of aspirin: Secondary | ICD-10-CM | POA: Diagnosis not present

## 2019-09-02 DIAGNOSIS — Z7901 Long term (current) use of anticoagulants: Secondary | ICD-10-CM | POA: Diagnosis not present

## 2019-09-02 DIAGNOSIS — Z794 Long term (current) use of insulin: Secondary | ICD-10-CM | POA: Diagnosis not present

## 2019-09-02 DIAGNOSIS — N400 Enlarged prostate without lower urinary tract symptoms: Secondary | ICD-10-CM | POA: Diagnosis not present

## 2019-09-02 DIAGNOSIS — I110 Hypertensive heart disease with heart failure: Secondary | ICD-10-CM | POA: Diagnosis not present

## 2019-09-02 DIAGNOSIS — Z87891 Personal history of nicotine dependence: Secondary | ICD-10-CM | POA: Diagnosis not present

## 2019-09-02 NOTE — Progress Notes (Signed)
Daily Session Note  Patient Details  Name: Lucas Caldwell MRN: [id number] Date of Birth: 1933/10/29 Referring Provider:     Cardiac Rehab from 05/20/2019 in [facility] Cardiac and Pulmonary Rehab  Referring Provider Bensimhon      Encounter Date: 09/02/2019  Check In:  Session Check In - 09/02/19 1340      Check-In   Supervising physician immediately available to respond to emergencies See telemetry face sheet for immediately available ER MD    Location [facility]-Cardiac & Pulmonary Rehab    Staff Present Renita Papa, RN BSN;Joseph [address] [place], [place], RCEP, CCRP, CCET    Virtual Visit No    Medication changes reported     No    Fall or balance concerns reported    No    Warm-up and Cool-down Performed on first and last piece of equipment    Resistance Training Performed Yes    VAD Patient? No    PAD/SET Patient? No      Pain Assessment   Currently in Pain? No/denies              Social History   Tobacco Use  Smoking Status Former Smoker  . Packs/day: 1.75  . Years: 20.00  . Pack years: 35.00  . Types: Cigarettes  . Start date: 58  . Quit date: 47  . Years since quitting: 46.4  Smokeless Tobacco Former Systems developer    Goals Met:  Independence with exercise equipment Exercise tolerated well No report of cardiac concerns or symptoms Strength training completed today  Goals Unmet:  Not Applicable  Comments: Pt able to follow exercise prescription today without complaint.  Will continue to monitor for progression.    Dr. Emily Filbert is Medical Director for [facility] and [name].

## 2019-09-06 ENCOUNTER — Encounter (HOSPITAL_COMMUNITY): Payer: Self-pay

## 2019-09-06 ENCOUNTER — Other Ambulatory Visit: Payer: Self-pay

## 2019-09-06 ENCOUNTER — Ambulatory Visit (HOSPITAL_COMMUNITY)
Admission: RE | Admit: 2019-09-06 | Discharge: 2019-09-06 | Disposition: A | Discharge status: Home / Self Care | Payer: Medicare HMO | Source: Ambulatory Visit | Attending: Cardiology | Admitting: Cardiology

## 2019-09-06 VITALS — BP 110/72 | HR 73 | Wt 203.0 lb

## 2019-09-06 DIAGNOSIS — Z955 Presence of coronary angioplasty implant and graft: Secondary | ICD-10-CM | POA: Insufficient documentation

## 2019-09-06 DIAGNOSIS — M109 Gout, unspecified: Secondary | ICD-10-CM | POA: Insufficient documentation

## 2019-09-06 DIAGNOSIS — E1122 Type 2 diabetes mellitus with diabetic chronic kidney disease: Secondary | ICD-10-CM | POA: Insufficient documentation

## 2019-09-06 DIAGNOSIS — Z7982 Long term (current) use of aspirin: Secondary | ICD-10-CM | POA: Diagnosis not present

## 2019-09-06 DIAGNOSIS — I471 Supraventricular tachycardia: Secondary | ICD-10-CM | POA: Diagnosis not present

## 2019-09-06 DIAGNOSIS — I130 Hypertensive heart and chronic kidney disease with heart failure and stage 1 through stage 4 chronic kidney disease, or unspecified chronic kidney disease: Secondary | ICD-10-CM | POA: Insufficient documentation

## 2019-09-06 DIAGNOSIS — K219 Gastro-esophageal reflux disease without esophagitis: Secondary | ICD-10-CM | POA: Insufficient documentation

## 2019-09-06 DIAGNOSIS — I453 Trifascicular block: Secondary | ICD-10-CM | POA: Diagnosis not present

## 2019-09-06 DIAGNOSIS — Z87891 Personal history of nicotine dependence: Secondary | ICD-10-CM | POA: Diagnosis not present

## 2019-09-06 DIAGNOSIS — E039 Hypothyroidism, unspecified: Secondary | ICD-10-CM | POA: Diagnosis not present

## 2019-09-06 DIAGNOSIS — Z79899 Other long term (current) drug therapy: Secondary | ICD-10-CM | POA: Diagnosis not present

## 2019-09-06 DIAGNOSIS — I5042 Chronic combined systolic (congestive) and diastolic (congestive) heart failure: Secondary | ICD-10-CM | POA: Diagnosis not present

## 2019-09-06 DIAGNOSIS — Z794 Long term (current) use of insulin: Secondary | ICD-10-CM | POA: Diagnosis not present

## 2019-09-06 DIAGNOSIS — Z7901 Long term (current) use of anticoagulants: Secondary | ICD-10-CM | POA: Insufficient documentation

## 2019-09-06 DIAGNOSIS — I2510 Atherosclerotic heart disease of native coronary artery without angina pectoris: Secondary | ICD-10-CM | POA: Diagnosis not present

## 2019-09-06 DIAGNOSIS — R42 Dizziness and giddiness: Secondary | ICD-10-CM | POA: Diagnosis not present

## 2019-09-06 DIAGNOSIS — E785 Hyperlipidemia, unspecified: Secondary | ICD-10-CM | POA: Insufficient documentation

## 2019-09-06 DIAGNOSIS — I480 Paroxysmal atrial fibrillation: Secondary | ICD-10-CM | POA: Diagnosis not present

## 2019-09-06 DIAGNOSIS — N1831 Chronic kidney disease, stage 3a: Secondary | ICD-10-CM | POA: Insufficient documentation

## 2019-09-06 DIAGNOSIS — I493 Ventricular premature depolarization: Secondary | ICD-10-CM | POA: Insufficient documentation

## 2019-09-06 DIAGNOSIS — I255 Ischemic cardiomyopathy: Secondary | ICD-10-CM | POA: Insufficient documentation

## 2019-09-06 DIAGNOSIS — H5461 Unqualified visual loss, right eye, normal vision left eye: Secondary | ICD-10-CM | POA: Insufficient documentation

## 2019-09-06 LAB — CBC
HCT: 38.5 % — ABNORMAL LOW (ref 39.0–52.0)
Hemoglobin: 11.7 g/dL — ABNORMAL LOW (ref 13.0–17.0)
MCH: 27.9 pg (ref 26.0–34.0)
MCHC: 30.4 g/dL (ref 30.0–36.0)
MCV: 91.9 fL (ref 80.0–100.0)
Platelets: 204 10*3/uL (ref 150–400)
RBC: 4.19 MIL/uL — ABNORMAL LOW (ref 4.22–5.81)
RDW: 15.2 % (ref 11.5–15.5)
WBC: 6.8 10*3/uL (ref 4.0–10.5)
nRBC: 0 % (ref 0.0–0.2)

## 2019-09-06 LAB — BASIC METABOLIC PANEL
Anion gap: 10 (ref 5–15)
BUN: 19 mg/dL (ref 8–23)
CO2: 21 mmol/L — ABNORMAL LOW (ref 22–32)
Calcium: 9.1 mg/dL (ref 8.9–10.3)
Chloride: 105 mmol/L (ref 98–111)
Creatinine, Ser: 1.51 mg/dL — ABNORMAL HIGH (ref 0.61–1.24)
GFR calc Af Amer: 49 mL/min — ABNORMAL LOW (ref >60)
GFR calc non Af Amer: 42 mL/min — ABNORMAL LOW (ref >60)
Glucose, Bld: 169 mg/dL — ABNORMAL HIGH (ref 70–99)
Potassium: 3.7 mmol/L (ref 3.5–5.1)
Sodium: 136 mmol/L (ref 135–145)

## 2019-09-06 NOTE — Patient Instructions (Signed)
It was great to see you today! No medication changes are needed at this time.   Labs today We will only contact you if something comes back abnormal or we need to make some changes. Otherwise no news is good news!  Your physician recommends that you schedule a follow-up appointment in: 4-6 weeks  Do the following things EVERYDAY: 1) Weigh yourself in the morning before breakfast. Write it down and keep it in a log. 2) Take your medicines as prescribed 3) Eat low salt foods--Limit salt (sodium) to 2000 mg per day.  4) Stay as active as you can everyday 5) Limit all fluids for the day to less than 2 liters   At the [facility], you and your health needs are our priority. As part of our continuing mission to provide you with exceptional heart care, we have created designated Provider Care Teams. These Care Teams include your primary Cardiologist (physician) and Advanced Practice Providers (APPs- Physician Assistants and Nurse Practitioners) who all work together to provide you with the care you need, when you need it.   You may see any of the following providers on your designated Care Team at your next follow up: [name] Dr Glori Bickers . Dr Loralie Champagne . Darrick Grinder, NP . Lyda Jester, PA . Audry Riles, PharmD   Please be sure to bring in all your medications bottles to every appointment.  [name]

## 2019-09-06 NOTE — H&P (View-Only) (Signed)
ADVANCED HF CLINIC PROGRESS NOTE  Primary Cardiologist: Dr. Saunders Revel AHF: [name] \  F/u for chronic systolic heart failure   HPI: Lucas Caldwell is a 86 y.o. male with history of coronary artery disease,chronic systolic heart failure due to likelymixed ischemic and nonischemic cardiomyopathy, hypertension, hyperlipidemia, type 2 diabetes mellitus, paroxysmal atrial fibrillation, chronic kidney disease stage III, CML, iron deficiency anemia,GI bleed due to duodenal ulcers,BPH, and hypothyroidism who was initially referred by Dr. Saunders Revel for further evaluation of his HF.    Has h/o NICM previously followed by Dr. Carolynn Serve at [facility]. He had good response to medical therapy in 2012 EF was reported to be 50%   Had PCI of his RCA CAD s/p RCA stent x2 08/2016 at [facility].    Switched his care to Dr. Saunders Revel in 2019. Recently struggling with increasing DOE.   Echo 08/2018 LVEF 35-40%, indeterminite LV diastolic filling pressures, tricuspid AV, mildly dilated pulmonary artery, no significant valvular disfunction.   Cardiac cath 08/2018 70% LM stenosis with abnl FFR of 0.86, though drop in iFR was due to diffuse prox LAD disease and only small gradient involving ostial LM. Imdur was increased.   Admitted 1/26 /21 with chest pain and A/C systolic heart failure. Repeat Cath 1/21 LM 60% LAD 70% with +FFR otherwise diffuse non-obstructive CAD -> PCI/DES LAD 1/21. RHC: RA 10 RV 53/12 PA 53/30 (40) PCWP 28 CO/CI 4.2/2.1 PA sat 67%.  Echo 04/22/19 EF 30-35%  Referred to [facility] 04[facility]2019 for further management of his systolic HF. He has been on goal directed medical therapy, including metoprolol succinate (dose limited by bifascicular block), hydralazine, and isosorbide mononitrate.  He has had problems with renal insufficiency in the past, limiting ACEI/ARB use in the past, though decision was recently made to rechallenge him given worsening HF and declining LVEF. At f/u visit w/ Dr. Saunders Revel 3/21 Losartan 25 mg was  added. He has done well with losartan. No issues.   Zio patch 06/2019 showed SR w/ 1st degree AVB, avg HR 81 bpm, 1 brief 5 beat run of NSVT, Two runs of SVT - the longest lasting 5 beats with an avg rate of 104 bpm. Rare PACs and PVCs noted.   At his most recent [facility] f/u 5/20, his volume status was felt to be low, ReDs clip was 31%. Torsemide was reduced to 60 mg daily. Cardiomems was discussed. Awaiting approval.   He returns to clinic today for f/u. Here w/ wife. Doing well. Functional status improving some. Can do more w/ less dyspnea. Enrolled in cardiac rehab. Lucas has been stable since last OV. No resting dyspnea. Denies orthopnea/PND. No CP. Compliant w/ medications. BP stable. No abnormal bleeding or falls but reports issues w/ balance. Complains of frequent dizziness but no syncope/ near syncope.     Past Medical History:  Diagnosis Date  . Arthritis    "probably in his legs" (08/15/2016)  . Blind right eye   . BPH (benign prostatic hyperplasia)   . Breast asymmetry    Left breast is larger, present for several years.  [name] CAD (coronary artery disease)    a. Cath in the late 90's - reportedly ok;  b. 2014 s/p stenting x 2 @ [facility]; c 08/19/16 Cath/PCI with DES -> RCA, plan to treat LM 70% medically. Seen by surgery and felt to be too high risk for CABG; d. 08/2018 Cath: LM 70 (iFR 0.86), LAD 20ost, 40p, 50/20m D1 70, LCX patent stent, OM1 30, RCA patent stent,  64mISR, 40d, RPDA 30. PCWP 8. CO/CI 4.0/2.1.  .Marland KitchenChronic combined systolic (congestive) and diastolic (congestive) heart failure ([place]    a. Previously reduced EF-->50% by echo in 2012;  b. 06/2015 Echo: EF 50-55%  c. 07/2016 Echo: EF 45-50%; d. 12/2017 Echo: EF 55%, Gr1 DD, mild MR; e. 08/2018 Echo: EF 35-40%, mildly dil PA.  .Marland KitchenChronic kidney disease (CKD), stage IV (severe) ([place]   . CML (chronic myelocytic leukemia) ([place]   . GERD (gastroesophageal reflux disease)   . GIB (gastrointestinal bleeding)    a. 12/2017 3 unit PRBC GIB in  setting of coumadin-->Endo/colon multiple duodenal ulcers and a single bleeding ulcer in the proximal ascending colon status post hemostatic clipping x2.  . Gout   . Hyperlipemia   . Hypertension   . Hypothyroidism   . Iron deficiency anemia   . Ischemic cardiomyopathy    a. Previously reduced EF-->50% by echo in 2012;  b. 06/2015 Echo: EF 50-55%, Gr2 DD, mild MR, mildly dil LA, Ao sclerosis, mild TR;  c. 07/2016 Echo: EF 45-50%; d. 12/2017 Echo: EF 55%, Gr1 DD, mild MR; e. 08/2018 Echo: EF 35-40%.  . Migraine    "in the 1960s" (08/15/2016)  . PAF (paroxysmal atrial fibrillation) (HCC)    a. ? Dx 2014-->s/p DCCV;  b. CHA2DS2VASc = 6--> Coumadin.  . Prostate cancer ([place]   . RBBB   . Type II diabetes mellitus ([place]     Current Outpatient Medications  Medication Sig Dispense Refill  . ACCU-CHEK AVIVA PLUS test strip Use to check blood sugar up to 2 times daily 200 each 3  . Accu-Chek Softclix Lancets lancets Use to check blood sugar up to 2 times daily. 200 each 3  . acetaminophen (TYLENOL) 500 MG tablet Take 1,000 mg by mouth every 6 (six) hours as needed for moderate pain or headache.     . Alcohol Swabs 70 % PADS Use when checking blood sugar up to 3x daily 100 each 11  . allopurinol (ZYLOPRIM) 100 MG tablet Take 1 tablet (100 mg total) by mouth daily. 90 tablet 3  . aspirin 81 MG chewable tablet Chew 81 mg by mouth daily.    . Dulaglutide (TRULICITY) 06.71MIW/5.8KDSOPN Inject 0.75 mg into the skin once a week. (Patient taking differently: Inject 0.75 mg into the skin every Saturday. ) 4 pen 0  . empagliflozin (JARDIANCE) 10 MG TABS tablet Take 10 mg by mouth daily before breakfast. 90 tablet 3  . gabapentin (NEURONTIN) 300 MG capsule Take 2 capsules (600 mg total) by mouth 2 (two) times daily. Max dose = 12066mdaily for kidney function 360 capsule 1  . hydrALAZINE (APRESOLINE) 50 MG tablet Take 1 tablet (50 mg total) by mouth 3 (three) times daily. 270 tablet 2  . Insulin Glargine  ([name] KWIKPEN) 100 UNIT/ML SOPN Inject 38 Units into the skin daily.     . Insulin Pen Needle 31G X 5 MM MISC 31 g by Does not apply route as directed. 100 each 5  . isosorbide mononitrate (IMDUR) 60 MG 24 hr tablet Take 1.5 tablets (90 mg total) by mouth daily. 135 tablet 2  . levothyroxine (SYNTHROID) 25 MCG tablet Take 1 tablet (25 mcg total) by mouth daily before breakfast. 90 tablet 1  . losartan (COZAAR) 25 MG tablet Take 1 tablet (25 mg total) by mouth daily. 90 tablet 1  . Menthol-Zinc Oxide (GOLD BOND EX) Apply 1 application topically daily as needed (muscle pain).    .[name]  metoprolol succinate (TOPROL-XL) 25 MG 24 hr tablet Take 1 tablet (25 mg total) by mouth daily. 90 tablet 1  . Multiple Vitamins-Minerals (MULTIVITAMIN ADULTS 50+ PO) Take 1 tablet by mouth every morning.    [name] omeprazole (PRILOSEC) 20 MG capsule Take 20 mg by mouth daily.     [name] Place 1 drop into both eyes daily as needed (dry eyes).    . polyethylene glycol (MIRALAX / GLYCOLAX) 17 g packet Take 17 g by mouth daily as needed for mild constipation.     . potassium chloride SA (KLOR-CON) 20 MEQ tablet Take 1 tablet (20 mEq total) by mouth daily. 90 tablet 3  . PROVENTIL HFA 108 (90 Base) MCG/ACT inhaler Inhale 2 puffs into the lungs every 6 (six) hours as needed for wheezing or shortness of breath.    . ranolazine (RANEXA) 500 MG 12 hr tablet Take 1 tablet (500 mg total) by mouth 2 (two) times daily. 180 tablet 2  . rosuvastatin (CRESTOR) 5 MG tablet Take 1 tablet (5 mg total) by mouth daily. 90 tablet 3  . torsemide (DEMADEX) 20 MG tablet Take 3 tablets (60 mg total) by mouth daily. 180 tablet 2  . warfarin (COUMADIN) 3 MG tablet Take 1 to 1 1/2 tablets by mouth daily as directed by the coumadin clinic 120 tablet 0  . Blood Glucose Monitoring Suppl (ACCU-CHEK AVIVA PLUS) w/Device KIT Use to check blood sugar up to 2 times daily 1 kit 0  . bosutinib (BOSULIF) 100 MG tablet Take  100 mg by mouth at bedtime as needed (tired/not feeling well). Take with food.  (Patient not taking: Reported on 09/06/2019)    . nitroGLYCERIN (NITROSTAT) 0.4 MG SL tablet Place 1 tablet (0.4 mg total) under the tongue every 5 (five) minutes x 3 doses as needed for chest pain. (Patient not taking: Reported on 09/06/2019) 25 tablet 4  . traMADol (ULTRAM) 50 MG tablet Take 1-2 tablets (50-100 mg total) by mouth at bedtime as needed. (Patient not taking: Reported on 08/25/2019) 30 tablet 2   No current facility-administered medications for this encounter.    Allergies  Allergen Reactions  . Clopidogrel Anaphylaxis    altered mental status;  (electrolytes "out of wack" and confusion per family; THEY DO NOT REMEMBER ANY OTHER REACTION)- MSB 10/10/15  . Ciprofloxacin Itching  . Mirtazapine Diarrhea    Bad dreams   . Spironolactone     gynecomastia      Social History   Socioeconomic History  . Marital status: Married    Spouse name: Not on file  . Number of children: Not on file  . Years of education: Not on file  . Highest education level: 7th grade  Occupational History  . Occupation: Retired    Comment: Owned his own [employer].  Tobacco Use  . Smoking status: Former Smoker    Packs/day: 1.75    Years: 20.00    Pack years: 35.00    Types: Cigarettes    Start date: 1955    Quit date: 1975    Years since quitting: 46.5  . Smokeless tobacco: Former Network engineer  . Vaping Use: Never used  Substance and Sexual Activity  . Alcohol use: No    Comment: previously drank heavily - quit 1979.  . Drug use: No  . Sexual activity: Never  Other Topics Concern  . Not on file  Social History Narrative   Working on transportation truck part time  Social Determinants of Health   Financial Resource Strain: Low Risk   . Difficulty of Paying Living Expenses: Not very hard  Food Insecurity: No Food Insecurity  . Worried About Charity fundraiser in the Last Year: Never true   . Ran Out of Food in the Last Year: Never true  Transportation Needs: No Transportation Needs  . Lack of Transportation (Medical): No  . Lack of Transportation (Non-Medical): No  Physical Activity: Inactive  . Days of Exercise per Week: 0 days  . Minutes of Exercise per Session: 0 min  Stress: No Stress Concern Present  . Feeling of Stress : Not at all  Social Connections: Moderately Integrated  . Frequency of Communication with Friends and Family: More than three times a week  . Frequency of Social Gatherings with Friends and Family: More than three times a week  . Attends Religious Services: More than 4 times per year  . Active Member of Clubs or Organizations: No  . Attends Archivist Meetings: Never  . Marital Status: Married  Human resources officer Violence: Not At Risk  . Fear of Current or Ex-Partner: No  . Emotionally Abused: No  . Physically Abused: No  . Sexually Abused: No      Family History  Problem Relation Age of Onset  . Brain cancer Father   . Diabetes Sister   . Heart attack Sister     Vitals:   09/06/19 1006  BP: 110/72  Pulse: 73  SpO2: 93%  Weight: 92.1 kg (203 lb)   Lucas Readings from Last 3 Encounters:  09/06/19 92.1 kg (203 lb)  08/23/19 91.3 kg (201 lb 6 oz)  08/05/19 92.3 kg (203 lb 6.4 oz)    PHYSICAL EXAM: General:  Well appearing elderly AAM. Mildly obese No respiratory difficulty HEENT: normal Neck: supple. no JVD. Carotids 2+ bilat; no bruits. No lymphadenopathy or thyromegaly appreciated. Cor: PMI nondisplaced. Regular rate & rhythm. No rubs, gallops or murmurs. Lungs: clear Abdomen: soft, nontender, nondistended. No hepatosplenomegaly. No bruits or masses. Good bowel sounds. Extremities: no cyanosis, clubbing, rash, edema Neuro: alert & oriented x 3, cranial nerves grossly intact. moves all 4 extremities w/o difficulty. Affect pleasant.    ASSESSMENT & PLAN:  1. Chronic systolic HF: - likely combination of iCM/NICM - EF  30-35% echo 2/21 (was 35-40% in 6/20) - NYHA III, chronic  - Recent RHC 04/13/19 with elevated filling pressures but BNP normal  - Volume status/ Lucas stable. Continue torsemide to 60 mg daily - Continue hydral 50 tid - Continue Imdur 90 daily - Continue Toprol 25 daily (dose reduced due to trifascicular block) - Continue Losartan 25 mg daily  - Continue Farxiga 10 mg daily  - With SBP of 110 and occasional dizziness, will not titrate meds further today. Doubt he will tolerate Entresto.   - Check BMP today  - Discussed Cardiomems. He would like to pursue. Awaiting insurance approval     2. CAD - cath 04/13/19 LAD 70% with +FFR otherwise diffuse non-obstructive CAD  - S/p PCI/DES to LAD 1/21 - followed by Dr. Saunders Revel - Denies CP   - completed short course of triple therapy and recently taken off of Brilinta by general cardiology 08/2019.  -  He continues on ASA and coumadin. Denies abnormal bleeding. Check CBC today - Enrolled in cardiac rehab   3. CKD Stage 3a - check BMP today   4. DM  - continue Farxiga 10 mg daily   5. PAF -  Zio patch 4/21 showed no AF. Mostly NSR w/ brief SVT/ NSVT, PACs and PVCs - Home sleep study has been ordered to r/o OSA  - Continue warfarin for a/c. Check CBC today   6. Snoring  - sleep study pending   F/u in  6- 8 weeks or sooner or needed.   Lyda Jester, PA-C  10:21 AM

## 2019-09-06 NOTE — Progress Notes (Signed)
 [facility] CLINIC PROGRESS NOTE  Primary Cardiologist: Dr. End AHF: Dr. Bensimhon \  F/u for chronic systolic heart failure   HPI: Lucas Caldwell is a 86 y.o. male with history of coronary artery disease,chronic systolic heart failure due to likelymixed ischemic and nonischemic cardiomyopathy, hypertension, hyperlipidemia, type 2 diabetes mellitus, paroxysmal atrial fibrillation, chronic kidney disease stage III, CML, iron deficiency anemia,GI bleed due to duodenal ulcers,BPH, and hypothyroidism who was initially referred by Dr. End for further evaluation of his HF.    Has h/o NICM previously followed by Dr. Kirkwood Adams at [facility]. He had good response to medical therapy in 2012 EF was reported to be 50%   Had PCI of his RCA CAD s/p RCA stent x2 08/2016 at [facility].    Switched his care to Dr. End in 2019. Recently struggling with increasing DOE.   Echo 08/2018 LVEF 35-40%, indeterminite LV diastolic filling pressures, tricuspid AV, mildly dilated pulmonary artery, no significant valvular disfunction.   Cardiac cath 08/2018 70% LM stenosis with abnl FFR of 0.86, though drop in iFR was due to diffuse prox LAD disease and only small gradient involving ostial LM. Imdur was increased.   Admitted 1/26 /21 with chest pain and A/C systolic heart failure. Repeat Cath 1/21 LM 60% LAD 70% with +FFR otherwise diffuse non-obstructive CAD -> PCI/DES LAD 1/21. RHC: RA 10 RV 53/12 PA 53/30 (40) PCWP 28 CO/CI 4.2/2.1 PA sat 67%.  Echo 04/22/19 EF 30-35%  Referred to [facility] 04/2019 for further management of his systolic HF. He has been on goal directed medical therapy, including metoprolol succinate (dose limited by bifascicular block), hydralazine, and isosorbide mononitrate.  He has had problems with renal insufficiency in the past, limiting ACEI/ARB use in the past, though decision was recently made to rechallenge him given worsening HF and declining LVEF. At f/u visit w/ Dr. End 3/21 Losartan 25 mg was  added. He has done well with losartan. No issues.   Zio patch 06/2019 showed SR w/ 1st degree AVB, avg HR 81 bpm, 1 brief 5 beat run of NSVT, Two runs of SVT - the longest lasting 5 beats with an avg rate of 104 bpm. Rare PACs and PVCs noted.   At his most recent [facility] f/u 5/20, his volume status was felt to be low, ReDs clip was 31%. Torsemide was reduced to 60 mg daily. Cardiomems was discussed. Awaiting approval.   He returns to clinic today for f/u. Here w/ wife. Doing well. Functional status improving some. Can do more w/ less dyspnea. Enrolled in cardiac rehab. Lucas has been stable since last OV. No resting dyspnea. Denies orthopnea/PND. No CP. Compliant w/ medications. BP stable. No abnormal bleeding or falls but reports issues w/ balance. Complains of frequent dizziness but no syncope/ near syncope.     Past Medical History:  Diagnosis Date  . Arthritis    "probably in his legs" (08/15/2016)  . Blind right eye   . BPH (benign prostatic hyperplasia)   . Breast asymmetry    Left breast is larger, present for several years.  . CAD (coronary artery disease)    a. Cath in the late 90's - reportedly ok;  b. 2014 s/p stenting x 2 @ [facility]; c 08/19/16 Cath/PCI with DES -> RCA, plan to treat LM 70% medically. Seen by surgery and felt to be too high risk for CABG; d. 08/2018 Cath: LM 70 (iFR 0.86), LAD 20ost, 40p, 50/60m, D1 70, LCX patent stent, OM1 30, RCA patent stent,   40m ISR, 40d, RPDA 30. PCWP 8. CO/CI 4.0/2.1.  . Chronic combined systolic (congestive) and diastolic (congestive) heart failure (HCC)    a. Previously reduced EF-->50% by echo in 2012;  b. 06/2015 Echo: EF 50-55%  c. 07/2016 Echo: EF 45-50%; d. 12/2017 Echo: EF 55%, Gr1 DD, mild MR; e. 08/2018 Echo: EF 35-40%, mildly dil PA.  . Chronic kidney disease (CKD), stage IV (severe) (HCC)   . CML (chronic myelocytic leukemia) (HCC)   . GERD (gastroesophageal reflux disease)   . GIB (gastrointestinal bleeding)    a. 12/2017 3 unit PRBC GIB in  setting of coumadin-->Endo/colon multiple duodenal ulcers and a single bleeding ulcer in the proximal ascending colon status post hemostatic clipping x2.  . Gout   . Hyperlipemia   . Hypertension   . Hypothyroidism   . Iron deficiency anemia   . Ischemic cardiomyopathy    a. Previously reduced EF-->50% by echo in 2012;  b. 06/2015 Echo: EF 50-55%, Gr2 DD, mild MR, mildly dil LA, Ao sclerosis, mild TR;  c. 07/2016 Echo: EF 45-50%; d. 12/2017 Echo: EF 55%, Gr1 DD, mild MR; e. 08/2018 Echo: EF 35-40%.  . Migraine    "in the 1960s" (08/15/2016)  . PAF (paroxysmal atrial fibrillation) (HCC)    a. ? Dx 2014-->s/p DCCV;  b. CHA2DS2VASc = 6--> Coumadin.  . Prostate cancer (HCC)   . RBBB   . Type II diabetes mellitus (HCC)     Current Outpatient Medications  Medication Sig Dispense Refill  . ACCU-CHEK AVIVA PLUS test strip Use to check blood sugar up to 2 times daily 200 each 3  . Accu-Chek Softclix Lancets lancets Use to check blood sugar up to 2 times daily. 200 each 3  . acetaminophen (TYLENOL) 500 MG tablet Take 1,000 mg by mouth every 6 (six) hours as needed for moderate pain or headache.     . Alcohol Swabs 70 % PADS Use when checking blood sugar up to 3x daily 100 each 11  . allopurinol (ZYLOPRIM) 100 MG tablet Take 1 tablet (100 mg total) by mouth daily. 90 tablet 3  . aspirin 81 MG chewable tablet Chew 81 mg by mouth daily.    . Dulaglutide (TRULICITY) 0.75 MG/0.5ML SOPN Inject 0.75 mg into the skin once a week. (Patient taking differently: Inject 0.75 mg into the skin every Saturday. ) 4 pen 0  . empagliflozin (JARDIANCE) 10 MG TABS tablet Take 10 mg by mouth daily before breakfast. 90 tablet 3  . gabapentin (NEURONTIN) 300 MG capsule Take 2 capsules (600 mg total) by mouth 2 (two) times daily. Max dose = 1200mg daily for kidney function 360 capsule 1  . hydrALAZINE (APRESOLINE) 50 MG tablet Take 1 tablet (50 mg total) by mouth 3 (three) times daily. 270 tablet 2  . Insulin Glargine  ([name] KWIKPEN) 100 UNIT/ML SOPN Inject 38 Units into the skin daily.     . Insulin Pen Needle 31G X 5 MM MISC 31 g by Does not apply route as directed. 100 each 5  . isosorbide mononitrate (IMDUR) 60 MG 24 hr tablet Take 1.5 tablets (90 mg total) by mouth daily. 135 tablet 2  . levothyroxine (SYNTHROID) 25 MCG tablet Take 1 tablet (25 mcg total) by mouth daily before breakfast. 90 tablet 1  . losartan (COZAAR) 25 MG tablet Take 1 tablet (25 mg total) by mouth daily. 90 tablet 1  . Menthol-Zinc Oxide (GOLD BOND EX) Apply 1 application topically daily as needed (muscle pain).    .   metoprolol succinate (TOPROL-XL) 25 MG 24 hr tablet Take 1 tablet (25 mg total) by mouth daily. 90 tablet 1  . Multiple Vitamins-Minerals (MULTIVITAMIN ADULTS 50+ PO) Take 1 tablet by mouth every morning.    . omeprazole (PRILOSEC) 20 MG capsule Take 20 mg by mouth daily.     . Polyethyl Glycol-Propyl Glycol (SYSTANE OP) Place 1 drop into both eyes daily as needed (dry eyes).    . polyethylene glycol (MIRALAX / GLYCOLAX) 17 g packet Take 17 g by mouth daily as needed for mild constipation.     . potassium chloride SA (KLOR-CON) 20 MEQ tablet Take 1 tablet (20 mEq total) by mouth daily. 90 tablet 3  . PROVENTIL HFA 108 (90 Base) MCG/ACT inhaler Inhale 2 puffs into the lungs every 6 (six) hours as needed for wheezing or shortness of breath.    . ranolazine (RANEXA) 500 MG 12 hr tablet Take 1 tablet (500 mg total) by mouth 2 (two) times daily. 180 tablet 2  . rosuvastatin (CRESTOR) 5 MG tablet Take 1 tablet (5 mg total) by mouth daily. 90 tablet 3  . torsemide (DEMADEX) 20 MG tablet Take 3 tablets (60 mg total) by mouth daily. 180 tablet 2  . warfarin (COUMADIN) 3 MG tablet Take 1 to 1 1/2 tablets by mouth daily as directed by the coumadin clinic 120 tablet 0  . Blood Glucose Monitoring Suppl (ACCU-CHEK AVIVA PLUS) w/Device KIT Use to check blood sugar up to 2 times daily 1 kit 0  . bosutinib (BOSULIF) 100 MG tablet Take  100 mg by mouth at bedtime as needed (tired/not feeling well). Take with food.  (Patient not taking: Reported on 09/06/2019)    . nitroGLYCERIN (NITROSTAT) 0.4 MG SL tablet Place 1 tablet (0.4 mg total) under the tongue every 5 (five) minutes x 3 doses as needed for chest pain. (Patient not taking: Reported on 09/06/2019) 25 tablet 4  . traMADol (ULTRAM) 50 MG tablet Take 1-2 tablets (50-100 mg total) by mouth at bedtime as needed. (Patient not taking: Reported on 08/25/2019) 30 tablet 2   No current facility-administered medications for this encounter.    Allergies  Allergen Reactions  . Clopidogrel Anaphylaxis    altered mental status;  (electrolytes "out of wack" and confusion per family; THEY DO NOT REMEMBER ANY OTHER REACTION)- MSB 10/10/15  . Ciprofloxacin Itching  . Mirtazapine Diarrhea    Bad dreams   . Spironolactone     gynecomastia      Social History   Socioeconomic History  . Marital status: Married    Spouse name: Not on file  . Number of children: Not on file  . Years of education: Not on file  . Highest education level: 7th grade  Occupational History  . 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  Tobacco Use  . Smoking status: Former Smoker    Packs/day: 1.75    Years: 20.00    Pack years: 35.00    Types: Cigarettes    Start date: 1955    Quit date: 1975    Years since quitting: 46.5  . Smokeless tobacco: Former User  Vaping Use  . Vaping Use: Never used  Substance and Sexual Activity  . Alcohol use: No    Comment: previously drank heavily - quit 1979.  . Drug use: No  . Sexual activity: Never  Other Topics Concern  . Not on file  Social History Narrative   Working on transportation truck part time      Social Determinants of Health   Financial Resource Strain: Low Risk   . Difficulty of Paying Living Expenses: Not very hard  Food Insecurity: No Food Insecurity  . Worried About Running Out of Food in the Last Year: Never true   . Ran Out of Food in the Last Year: Never true  Transportation Needs: No Transportation Needs  . Lack of Transportation (Medical): No  . Lack of Transportation (Non-Medical): No  Physical Activity: Inactive  . Days of Exercise per Week: 0 days  . Minutes of Exercise per Session: 0 min  Stress: No Stress Concern Present  . Feeling of Stress : Not at all  Social Connections: Moderately Integrated  . Frequency of Communication with Friends and Family: More than three times a week  . Frequency of Social Gatherings with Friends and Family: More than three times a week  . Attends Religious Services: More than 4 times per year  . Active Member of Clubs or Organizations: No  . Attends Club or Organization Meetings: Never  . Marital Status: Married  Intimate Partner Violence: Not At Risk  . Fear of Current or Ex-Partner: No  . Emotionally Abused: No  . Physically Abused: No  . Sexually Abused: No      Family History  Problem Relation Age of Onset  . Brain cancer Father   . Diabetes Sister   . Heart attack Sister     Vitals:   09/06/19 1006  BP: 110/72  Pulse: 73  SpO2: 93%  Weight: 92.1 kg (203 lb)   Lucas Readings from Last 3 Encounters:  09/06/19 92.1 kg (203 lb)  08/23/19 91.3 kg (201 lb 6 oz)  08/05/19 92.3 kg (203 lb 6.4 oz)    PHYSICAL EXAM: General:  Well appearing elderly AAM. Mildly obese No respiratory difficulty HEENT: normal Neck: supple. no JVD. Carotids 2+ bilat; no bruits. No lymphadenopathy or thyromegaly appreciated. Cor: PMI nondisplaced. Regular rate & rhythm. No rubs, gallops or murmurs. Lungs: clear Abdomen: soft, nontender, nondistended. No hepatosplenomegaly. No bruits or masses. Good bowel sounds. Extremities: no cyanosis, clubbing, rash, edema Neuro: alert & oriented x 3, cranial nerves grossly intact. moves all 4 extremities w/o difficulty. Affect pleasant.    ASSESSMENT & PLAN:  1. Chronic systolic HF: - likely combination of iCM/NICM - EF  30-35% echo 2/21 (was 35-40% in 6/20) - NYHA III, chronic  - Recent RHC 04/13/19 with elevated filling pressures but BNP normal  - Volume status/ Lucas stable. Continue torsemide to 60 mg daily - Continue hydral 50 tid - Continue Imdur 90 daily - Continue Toprol 25 daily (dose reduced due to trifascicular block) - Continue Losartan 25 mg daily  - Continue Farxiga 10 mg daily  - With SBP of 110 and occasional dizziness, will not titrate meds further today. Doubt he will tolerate Entresto.   - Check BMP today  - Discussed Cardiomems. He would like to pursue. Awaiting insurance approval     2. CAD - cath 04/13/19 LAD 70% with +FFR otherwise diffuse non-obstructive CAD  - S/p PCI/DES to LAD 1/21 - followed by Dr. End - Denies CP   - completed short course of triple therapy and recently taken off of Brilinta by general cardiology 08/2019.  -  He continues on ASA and coumadin. Denies abnormal bleeding. Check CBC today - Enrolled in cardiac rehab   3. CKD Stage 3a - check BMP today   4. DM  - continue Farxiga 10 mg daily   5. PAF -   Zio patch 4/21 showed no AF. Mostly NSR w/ brief SVT/ NSVT, PACs and PVCs - Home sleep study has been ordered to r/o OSA  - Continue warfarin for a/c. Check CBC today   6. Snoring  - sleep study pending   F/u in  6- 8 weeks or sooner or needed.   Jilian West, PA-C  10:21 AM    

## 2019-09-08 ENCOUNTER — Ambulatory Visit (INDEPENDENT_AMBULATORY_CARE_PROVIDER_SITE_OTHER): Payer: Medicare HMO | Admitting: Family Medicine

## 2019-09-08 ENCOUNTER — Encounter: Payer: Medicare HMO | Admitting: *Deleted

## 2019-09-08 ENCOUNTER — Encounter: Payer: Self-pay | Admitting: Family Medicine

## 2019-09-08 ENCOUNTER — Other Ambulatory Visit: Payer: Self-pay

## 2019-09-08 VITALS — BP 140/76 | HR 91 | Temp 97.3°F | Resp 16 | Ht 64.0 in | Wt 205.0 lb

## 2019-09-08 DIAGNOSIS — I5042 Chronic combined systolic (congestive) and diastolic (congestive) heart failure: Secondary | ICD-10-CM

## 2019-09-08 DIAGNOSIS — E1121 Type 2 diabetes mellitus with diabetic nephropathy: Secondary | ICD-10-CM | POA: Diagnosis not present

## 2019-09-08 DIAGNOSIS — I5022 Chronic systolic (congestive) heart failure: Secondary | ICD-10-CM

## 2019-09-08 DIAGNOSIS — Z79899 Other long term (current) drug therapy: Secondary | ICD-10-CM | POA: Diagnosis not present

## 2019-09-08 DIAGNOSIS — R531 Weakness: Secondary | ICD-10-CM

## 2019-09-08 DIAGNOSIS — I110 Hypertensive heart disease with heart failure: Secondary | ICD-10-CM | POA: Diagnosis not present

## 2019-09-08 DIAGNOSIS — Z794 Long term (current) use of insulin: Secondary | ICD-10-CM

## 2019-09-08 DIAGNOSIS — R2689 Other abnormalities of gait and mobility: Secondary | ICD-10-CM

## 2019-09-08 DIAGNOSIS — Z87891 Personal history of nicotine dependence: Secondary | ICD-10-CM | POA: Diagnosis not present

## 2019-09-08 DIAGNOSIS — Z7982 Long term (current) use of aspirin: Secondary | ICD-10-CM | POA: Diagnosis not present

## 2019-09-08 DIAGNOSIS — N400 Enlarged prostate without lower urinary tract symptoms: Secondary | ICD-10-CM | POA: Diagnosis not present

## 2019-09-08 DIAGNOSIS — R29898 Other symptoms and signs involving the musculoskeletal system: Secondary | ICD-10-CM

## 2019-09-08 DIAGNOSIS — E119 Type 2 diabetes mellitus without complications: Secondary | ICD-10-CM | POA: Diagnosis not present

## 2019-09-08 DIAGNOSIS — Z7901 Long term (current) use of anticoagulants: Secondary | ICD-10-CM | POA: Diagnosis not present

## 2019-09-08 LAB — POCT GLYCOSYLATED HEMOGLOBIN (HGB A1C): Hemoglobin A1C: 7.1 % — AB (ref 4.0–5.6)

## 2019-09-08 NOTE — Progress Notes (Signed)
Daily Session Note  Patient Details  Name: Lucas Caldwell MRN: [id number] Date of Birth: Oct 13, 1933 Referring Provider:     Cardiac Rehab from 05/20/2019 in [facility]  Referring Provider Bensimhon      Encounter Date: 09/08/2019  Check In:  Session Check In - 09/08/19 1340      Check-In   Supervising physician immediately available to respond to emergencies See telemetry face sheet for immediately available ER MD    Location [facility]-Cardiac & Pulmonary Re[facility]    Staff Present Renita Papa, RN BSN;Jessica Channing, MA, RCEP, CCRP, CCET;Melissa Briarcliff RDN, Rowe Pavy, [place], ACSM CEP, Exercise Physiologist    Virtual Visit No    Medication changes reported     No    Fall or balance concerns reported    No    Warm-up and Cool-down Performed on first and last piece of equipment    Resistance Training Performed Yes    VAD Patient? No    PAD/SET Patient? No      Pain Assessment   Currently in Pain? No/denies              Social History   Tobacco Use  Smoking Status Former Smoker  . Packs/day: 1.75  . Years: 20.00  . Pack years: 35.00  . Types: Cigarettes  . Start date: 107  . Quit date: 18  . Years since quitting: 46.5  Smokeless Tobacco Former Systems developer    Goals Met:  Independence with exercise equipment Exercise tolerated well No report of cardiac concerns or symptoms Strength training completed today  Goals Unmet:  Not Applicable  Comments: Pt able to follow exercise prescription today without complaint.  Will continue to monitor for progression.    Dr. Emily Filbert is Medical Director for [name].

## 2019-09-08 NOTE — Progress Notes (Signed)
Subjective:    Patient ID: Lucas Caldwell, male    DOB: Feb 24, 1934, 86 y.o.   MRN: [id number]  Lucas Caldwell is a 86 y.o. 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 Tolerating well w/o side-effects CurrentlyOFFACEi(Enalapril DC'd by Cardiology) Lifestyle: - Diet (improving DM diet, drinks some water) - Exercise (limited by dyspnea) - Admits chronicDM Neuropathywith pain in feet bilateral,on Gabapentin with improvement, taking 3016mx 2 pills twice a day but still has neuropathy burning pain worse at night. Failed Lyrica in past. Only tried Tramadol temporary- STILL DID NOT TRY MED SINCE LAST VISIT REGULARLY - Followed by Podiatry, he is s/p R great toe amputation partial- [facility] - they will follow up with him to treat callus Has diabetic shoes - Denies hypoglycemia, nausea vomiting, fever chills, swelling, polyuria  Balance Disorder / Dizziness / General Weakness [name] followed by Cardiac Rehab, but now requesting PT for Balance therapy. High risk of falls and has issue with partial toe amputation and weakness affecting his gait.   [facility] 09/08/2019 09/08/2019 05/20/2019  Decreased Interest '1 1 1  ' Down, Depressed, Hopeless '1 1 2  ' PHQ - 2 Score '2 2 3  ' Altered sleeping '3 3 2  ' Tired, decreased energy 0 0 3  Change in appetite 0 0 2  Feeling bad or failure about yourself  '3 3 2  ' Trouble concentrating 0 0 2  Moving slowly or fidgety/restless - 3 2  Suicidal thoughts 0 0 0  PHQ-9 Score '8 11 16  ' Difficult doing work/chores Not difficult at all Not difficult at all Somewhat difficult    Some recent data might be hidden   GAD 7 : Generalized Anxiety Score 09/08/2019 09/08/2019  Nervous, Anxious, on Edge 3 3  Control/stop worrying 3 3  Worry too much - different things 3 3  Trouble relaxing 0 0  Restless 0 0  Easily annoyed or irritable 0 0  Afraid - awful might happen 1 1  Total GAD 7 Score 10 10  Anxiety Difficulty Not difficult at all Not difficult at all    Social History   Tobacco Use  . Smoking status: Former Smoker    Packs/day: 1.75    Years: 20.00    Pack years: 35.00    Types: Cigarettes    Start date: 1955    Quit date: 1975    Years since quitting: 46.5  . 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 Vaping Use: Never used  Substance Use Topics  . Alcohol use: No    Comment: previously drank heavily - quit 1979.  . Drug use: No    Review of Systems Per HPI unless specifically indicated above   Current Outpatient Medications:  .  ACCU-CHEK AVIVA PLUS test strip, Use to check blood sugar up to 2 times daily, Disp: 200 each, Rfl: 3 .  Accu-Chek Softclix Lancets lancets, Use to check blood sugar up to 2 times daily., Disp: 200 each, Rfl: 3 .  acetaminophen (TYLENOL) 500 MG tablet, Take 1,000 mg by mouth every 6 (six) hours as needed for moderate pain or headache. ,  Disp: , Rfl:  .  Alcohol Swabs 70 % PADS, Use when checking blood sugar up to 3x daily, Disp: 100 each, Rfl: 11 .  allopurinol (ZYLOPRIM) 100 MG tablet, Take 1 tablet (100 mg total) by mouth daily., Disp: 90 tablet, Rfl: 3 .  aspirin 81 MG chewable tablet, Chew 81 mg by mouth daily., Disp: , Rfl:  .  Blood Glucose Monitoring Suppl (ACCU-CHEK AVIVA PLUS) w/Device KIT, Use to check blood sugar up to 2 times daily, Disp: 1 kit, Rfl: 0 .  bosutinib (BOSULIF) 100 MG tablet, Take 100 mg by mouth at bedtime as needed (tired/not feeling well). Take with food. , Disp: , Rfl:  .  Dulaglutide (TRULICITY) 9.56 LO/7.5IE SOPN, Inject 0.75 mg into the skin once a week. (Patient taking differently:  Inject 0.75 mg into the skin every Saturday. ), Disp: 4 pen, Rfl: 0 .  empagliflozin (JARDIANCE) 10 MG TABS tablet, Take 10 mg by mouth daily before breakfast., Disp: 90 tablet, Rfl: 3 .  gabapentin (NEURONTIN) 300 MG capsule, Take 2 capsules (600 mg total) by mouth 2 (two) times daily. Max dose = 127m daily for kidney function, Disp: 360 capsule, Rfl: 1 .  Insulin Glargine ([name] KWIKPEN) 100 UNIT/ML SOPN, Inject 38 Units into the skin daily. , Disp: , Rfl:  .  Insulin Pen Needle 31G X 5 MM MISC, 31 g by Does not apply route as directed., Disp: 100 each, Rfl: 5 .  isosorbide mononitrate (IMDUR) 60 MG 24 hr tablet, Take 1.5 tablets (90 mg total) by mouth daily., Disp: 135 tablet, Rfl: 2 .  levothyroxine (SYNTHROID) 25 MCG tablet, Take 1 tablet (25 mcg total) by mouth daily before breakfast., Disp: 90 tablet, Rfl: 1 .  losartan (COZAAR) 25 MG tablet, Take 1 tablet (25 mg total) by mouth daily., Disp: 90 tablet, Rfl: 1 .  Menthol-Zinc Oxide (GOLD BOND EX), Apply 1 application topically daily as needed (muscle pain)., Disp: , Rfl:  .  metoprolol succinate (TOPROL-XL) 25 MG 24 hr tablet, Take 1 tablet (25 mg total) by mouth daily., Disp: 90 tablet, Rfl: 1 .  Multiple Vitamins-Minerals (MULTIVITAMIN ADULTS 50+ PO), Take 1 tablet by mouth every morning., Disp: , Rfl:  .  nitroGLYCERIN (NITROSTAT) 0.4 MG SL tablet, Place 1 tablet (0.4 mg total) under the tongue every 5 (five) minutes x 3 doses as needed for chest pain., Disp: 25 tablet, Rfl: 4 .  omeprazole (PRILOSEC) 20 MG capsule, Take 20 mg by mouth daily. , Disp: , Rfl:  .  Polyethyl Glycol-Propyl Glycol (SYSTANE OP), Place 1 drop into both eyes daily as needed (dry eyes)., Disp: , Rfl:  .  polyethylene glycol (MIRALAX / GLYCOLAX) 17 g packet, Take 17 g by mouth daily as needed for mild constipation. , Disp: , Rfl:  .  potassium chloride SA (KLOR-CON) 20 MEQ tablet, Take 1 tablet (20 mEq total) by mouth daily., Disp: 90 tablet, Rfl: 3 .  PROVENTIL  HFA 108 (90 Base) MCG/ACT inhaler, Inhale 2 puffs into the lungs every 6 (six) hours as needed for wheezing or shortness of breath., Disp: , Rfl:  .  ranolazine (RANEXA) 500 MG 12 hr tablet, Take 1 tablet (500 mg total) by mouth 2 (two) times daily., Disp: 180 tablet, Rfl: 2 .  torsemide (DEMADEX) 20 MG tablet, Take 3 tablets (60 mg total) by mouth daily., Disp: 180 tablet, Rfl: 2 .  traMADol (ULTRAM) 50 MG tablet, Take 1-2 tablets (50-100 mg total) by mouth at bedtime as needed.,  Disp: 30 tablet, Rfl: 2 .  warfarin (COUMADIN) 3 MG tablet, Take 1 to 1 1/2 tablets by mouth daily as directed by the coumadin clinic, Disp: 120 tablet, Rfl: 0 .  hydrALAZINE (APRESOLINE) 50 MG tablet, Take 1 tablet (50 mg total) by mouth 3 (three) times daily., Disp: 270 tablet, Rfl: 2 .  rosuvastatin (CRESTOR) 5 MG tablet, Take 1 tablet (5 mg total) by mouth daily., Disp: 90 tablet, Rfl: 3      Objective:    BP 140/76   Pulse 91   Temp (!) 97.3 F (36.3 C) (Temporal)   Resp 16   Ht '5\' 4"'  (1.626 m)   Wt 205 lb (93 kg)   SpO2 98%   BMI 35.19 kg/m   Wt Readings from Last 3 Encounters:  09/08/19 205 lb (93 kg)  09/06/19 203 lb (92.1 kg)  08/23/19 201 lb 6 oz (91.3 kg)    Physical Exam Vitals and nursing note reviewed.  Constitutional:      General: [name] not in acute distress.    Appearance: [name] well-developed. [name] obese. [name] not diaphoretic.     Comments: Well-appearing, comfortable, cooperative  HENT:     Head: Normocephalic and atraumatic.  Eyes:     General:        Right eye: No discharge.        Left eye: No discharge.     Conjunctiva/sclera: Conjunctivae normal.  Neck:     Thyroid: No thyromegaly.  Cardiovascular:     Rate and Rhythm: Normal rate and regular rhythm.     Heart sounds: Normal heart sounds. No murmur heard.   Pulmonary:     Effort: Pulmonary effort is normal. No respiratory distress.     Breath sounds: Normal breath sounds. No wheezing or rales.  Musculoskeletal:          General: Normal range of motion.     Cervical back: Normal range of motion and neck supple.     Comments: Feet No edema R foot s/p partial amputation great toe. Has thicker callus formation at tip of toe some flaking  Lymphadenopathy:     Cervical: No cervical adenopathy.  Skin:    General: Skin is warm and dry.     Findings: No erythema or rash.  Neurological:     Mental Status: [name] alert and oriented to person, place, and time.  Psychiatric:        Behavior: Behavior normal.     Comments: Well groomed, good eye contact, normal speech and thoughts       Results for orders placed or performed in visit on 09/08/19  POCT HgB A1C  Result Value Ref Range   Hemoglobin A1C 7.1 (A) 4.0 - 5.6 %      Assessment & Plan:   Problem List Items Addressed This Visit    Controlled type 2 diabetes mellitus with diabetic nephropathy ([place]) - Primary    Controlled A1c 7.1 lifestyle improving Complications - hypoglycemia (improved), CKD-III, peripheral neuropathy, cataracts, CAD  Plan:  1. Discussed DM management concern with CKD and risk of hypoglycemia - limited med options due to CKD - CONTINUE Trulicity 2.94TM weekly - for next 2 months approx as he has shipment of med. When runs low down to last 1 month, he can call us or notify and we can order CHANGE request to inc dos Trulicity 5.4YT to his PAP if still covered - For now continue [name] 38u, in future if inc  trulicity will decrease down to about 30u then further as indicated - Counseling on risk of hypoglycemia and symptoms 2. Encourage improved lifestyle - keep improving diet - limit carbs, limit sweet tea, inc water - he has poor health literacy on diabetes      Relevant Orders   POCT HgB A1C (Completed)    Other Visit Diagnoses    Bilateral leg weakness       Relevant Orders   Ambulatory referral to Physical Therapy   Generalized weakness       Relevant Orders   Ambulatory referral to Physical Therapy   Balance  disorder       Relevant Orders   Ambulatory referral to Physical Therapy      #Foot pain Trial back on Tramadol, has plenty of medicine. Take PRN Return to Podiatry  Referral to Amb PT for balance  No orders of the defined types were placed in this encounter.  Orders Placed This Encounter  Procedures  . Ambulatory referral to Physical Therapy    Referral Priority:   Routine    Referral Type:   Physical Medicine    Referral Reason:   Specialty Services Required    Requested Specialty:   Physical Therapy    Number of Visits Requested:   1  . POCT HgB A1C      Follow up plan: Return in about 3 months (around 12/09/2019) for 3 month DM, Cholesterol, Anemia, lab results, HTN.   [name], [facility] 09/08/2019, 4:10 PM

## 2019-09-08 NOTE — Patient Instructions (Addendum)
Thank you for coming to the office today.  Plan to increase Trulicity from 0.75mg  weekly injection - up to double dose 1.5mg  injection ONCE A WEEK STILL. - Use your current supply for next 2 months or so, until you get down to ONE MONTH LEFT of the Trulicity 9.53 - Then CALL us and request that we submit a DOSE INCREASE so you will get higher dose next time  Once you start the new higher dose in the future, you can REDUCE [name] from 38 down to 30 units, then after next 2-4 more weeks, can keep reducing [name] dose by 2 units every 1 week if your fasting sugar in morning is < 150  - Goal to get down to approximately 20 units would be good.  Try Tramadol for the foot pain as needed.  Follow up with Podiatry for treating the callus.  DUE for FASTING BLOOD WORK (no food or drink after midnight before the lab appointment, only water or coffee without cream/sugar on the morning of)  SCHEDULE "Lab Only" visit in the morning at the clinic for lab draw in 3 MONTHS   - Make sure Lab Only appointment is at about 1 week before your next appointment, so that results will be available  For Lab Results, once available within 2-3 days of blood draw, you can can log in to MyChart online to view your results and a brief explanation. Also, we can discuss results at next follow-up visit.   Please schedule a Follow-up Appointment to: Return in about 3 months (around 12/09/2019) for 3 month DM, Cholesterol, Anemia, lab results, HTN.  If you have any other questions or concerns, please feel free to call the office or send a message through [place]. You may also schedule an earlier appointment if necessary.  Additionally, you may be receiving a survey about your experience at our office within a few days to 1 week by e-mail or mail. We value your feedback.  [name], DO [place]

## 2019-09-09 ENCOUNTER — Encounter: Payer: Medicare HMO | Admitting: *Deleted

## 2019-09-09 DIAGNOSIS — I5042 Chronic combined systolic (congestive) and diastolic (congestive) heart failure: Secondary | ICD-10-CM

## 2019-09-09 DIAGNOSIS — N400 Enlarged prostate without lower urinary tract symptoms: Secondary | ICD-10-CM | POA: Diagnosis not present

## 2019-09-09 DIAGNOSIS — Z87891 Personal history of nicotine dependence: Secondary | ICD-10-CM | POA: Diagnosis not present

## 2019-09-09 DIAGNOSIS — Z7982 Long term (current) use of aspirin: Secondary | ICD-10-CM | POA: Diagnosis not present

## 2019-09-09 DIAGNOSIS — I110 Hypertensive heart disease with heart failure: Secondary | ICD-10-CM | POA: Diagnosis not present

## 2019-09-09 DIAGNOSIS — Z79899 Other long term (current) drug therapy: Secondary | ICD-10-CM | POA: Diagnosis not present

## 2019-09-09 DIAGNOSIS — Z7901 Long term (current) use of anticoagulants: Secondary | ICD-10-CM | POA: Diagnosis not present

## 2019-09-09 DIAGNOSIS — I5022 Chronic systolic (congestive) heart failure: Secondary | ICD-10-CM

## 2019-09-09 DIAGNOSIS — Z794 Long term (current) use of insulin: Secondary | ICD-10-CM | POA: Diagnosis not present

## 2019-09-09 DIAGNOSIS — E119 Type 2 diabetes mellitus without complications: Secondary | ICD-10-CM | POA: Diagnosis not present

## 2019-09-09 NOTE — Assessment & Plan Note (Signed)
Controlled A1c 7.1 lifestyle improving Complications - hypoglycemia (improved), CKD-III, peripheral neuropathy, cataracts, CAD  Plan:  1. Discussed DM management concern with CKD and risk of hypoglycemia - limited med options due to CKD - CONTINUE Trulicity 0.75mg  weekly - for next 2 months approx as he has shipment of med. When runs low down to last 1 month, he can call us or notify and we can order CHANGE request to [employer]  Trulicity 1.5mg  to his PAP if still covered - For now continue [name] 38u, in future if inc trulicity will decrease down to about 30u then further as indicated - Counseling on risk of hypoglycemia and symptoms 2. Encourage improved lifestyle - keep improving diet - limit carbs, limit sweet tea, inc water - he has poor health literacy on diabetes

## 2019-09-09 NOTE — Progress Notes (Signed)
Daily Session Note  Patient Details  Name: Lucas Caldwell MRN: [id number] Date of Birth: [birth year] Referring Provider:     Cardiac Rehab from 05/20/2019 in [facility]  Referring Provider Bensimhon      Encounter Date: 09/09/2019  Check In:  Session Check In - 09/09/19 1337      Check-In   Supervising physician immediately available to respond to emergencies See telemetry face sheet for immediately available ER MD    Location [facility]-Cardiac & Pulmonary Rehab    Staff Present Renita Papa, RN BSN;Joseph [address] [place], [place], [place], CCRP, CCET    Virtual Visit No    Medication changes reported     No    Fall or balance concerns reported    No    Warm-up and Cool-down Performed on first and last piece of equipment    Resistance Training Performed Yes    VAD Patient? No    PAD/SET Patient? No      Pain Assessment   Currently in Pain? No/denies              Social History   Tobacco Use  Smoking Status Former Smoker  . Packs/day: 1.75  . Years: 20.00  . Pack years: 35.00  . Types: Cigarettes  . Start date: 69  . Quit date: 84  . Years since quitting: 46.5  Smokeless Tobacco Former Systems developer    Goals Met:  Independence with exercise equipment Exercise tolerated well No report of cardiac concerns or symptoms Strength training completed today  Goals Unmet:  Not Applicable  Comments: Pt able to follow exercise prescription today without complaint.  Will continue to monitor for progression.    Dr. Emily Filbert is Medical Director for [place] and [name].

## 2019-09-13 DIAGNOSIS — L97511 Non-pressure chronic ulcer of other part of right foot limited to breakdown of skin: Secondary | ICD-10-CM | POA: Diagnosis not present

## 2019-09-13 DIAGNOSIS — E11621 Type 2 diabetes mellitus with foot ulcer: Secondary | ICD-10-CM | POA: Diagnosis not present

## 2019-09-13 DIAGNOSIS — E1142 Type 2 diabetes mellitus with diabetic polyneuropathy: Secondary | ICD-10-CM | POA: Diagnosis not present

## 2019-09-14 ENCOUNTER — Encounter (HOSPITAL_COMMUNITY): Payer: Self-pay | Admitting: Cardiology

## 2019-09-15 ENCOUNTER — Ambulatory Visit (INDEPENDENT_AMBULATORY_CARE_PROVIDER_SITE_OTHER): Payer: Medicare HMO | Admitting: Internal Medicine

## 2019-09-15 ENCOUNTER — Encounter: Payer: Medicare HMO | Admitting: *Deleted

## 2019-09-15 ENCOUNTER — Other Ambulatory Visit: Payer: Self-pay

## 2019-09-15 ENCOUNTER — Encounter: Payer: Self-pay | Admitting: Internal Medicine

## 2019-09-15 VITALS — BP 120/60 | HR 87 | Ht 65.0 in | Wt 205.0 lb

## 2019-09-15 DIAGNOSIS — Z87891 Personal history of nicotine dependence: Secondary | ICD-10-CM | POA: Diagnosis not present

## 2019-09-15 DIAGNOSIS — Z79899 Other long term (current) drug therapy: Secondary | ICD-10-CM | POA: Diagnosis not present

## 2019-09-15 DIAGNOSIS — N400 Enlarged prostate without lower urinary tract symptoms: Secondary | ICD-10-CM | POA: Diagnosis not present

## 2019-09-15 DIAGNOSIS — I5042 Chronic combined systolic (congestive) and diastolic (congestive) heart failure: Secondary | ICD-10-CM | POA: Diagnosis not present

## 2019-09-15 DIAGNOSIS — I5022 Chronic systolic (congestive) heart failure: Secondary | ICD-10-CM

## 2019-09-15 DIAGNOSIS — Z7901 Long term (current) use of anticoagulants: Secondary | ICD-10-CM | POA: Diagnosis not present

## 2019-09-15 DIAGNOSIS — I25118 Atherosclerotic heart disease of native coronary artery with other forms of angina pectoris: Secondary | ICD-10-CM | POA: Diagnosis not present

## 2019-09-15 DIAGNOSIS — I10 Essential (primary) hypertension: Secondary | ICD-10-CM | POA: Diagnosis not present

## 2019-09-15 DIAGNOSIS — I110 Hypertensive heart disease with heart failure: Secondary | ICD-10-CM | POA: Diagnosis not present

## 2019-09-15 DIAGNOSIS — E785 Hyperlipidemia, unspecified: Secondary | ICD-10-CM

## 2019-09-15 DIAGNOSIS — E119 Type 2 diabetes mellitus without complications: Secondary | ICD-10-CM | POA: Diagnosis not present

## 2019-09-15 DIAGNOSIS — Z7982 Long term (current) use of aspirin: Secondary | ICD-10-CM | POA: Diagnosis not present

## 2019-09-15 DIAGNOSIS — Z794 Long term (current) use of insulin: Secondary | ICD-10-CM | POA: Diagnosis not present

## 2019-09-15 MED ORDER — HYDRALAZINE HCL 25 MG PO TABS
25.0000 mg | ORAL_TABLET | Freq: Three times a day (TID) | ORAL | 1 refills | Status: DC
Start: 2019-09-15 — End: 2019-10-01

## 2019-09-15 NOTE — Progress Notes (Signed)
Follow-up Outpatient Visit Date: 09/15/2019  Primary Care Provider: [name], DO [address] [address]  Chief Complaint: Follow-up coronary artery disease and heart failure  HPI:  [name] is a 86 y.o. male with history of coronary artery disease,chronic systolic heart failure due to likelymixed ischemic and nonischemic cardiomyopathy, hypertension, hyperlipidemia, type 2 diabetes mellitus, paroxysmal atrial fibrillation, chronic kidney disease stage III, CML, iron deficiency anemia,GI bleed due to duodenal ulcers,BPH, and hypothyroidism, who presents for follow-up of coronary artery disease and chronic HFrEF.  Over the last 6 months, [name] has struggled with significant fatigue, which did not improve with PCI to be mid LAD.  Of note, the vessel is diffusely diseased and intervention was performed upon a segment of most severe stenosis.  We are concerned that ticagrelor may have been contributing to his dyspnea; we ultimately discontinued ticagrelor after 3 months in favor of aspirin and warfarin.  [name] has also been following with the advanced heart failure clinic and is scheduled for CardioMEMS placement next month to assist with volume management.  Today, [name] reports that he is feeling better than when I last saw him.  He rates his improvement at "50% better."  He feels like cardiac rehab is helping him.  [name] tolerating his medications okay, though he notes that his balance is frequently unsteady and is accompanied by orthostatic lightheadedness.  He has put on a few pounds over the last few days and admits to eating a gravy biscuit at [employer] morning.  [name] currently taking torsemide 60 mg daily.  --------------------------------------------------------------------------------------------------  Past Medical History:  Diagnosis Date  . Arthritis    "probably in his legs" (08/15/2016)  . Blind right eye   . BPH (benign prostatic  hyperplasia)   . Breast asymmetry    Left breast is larger, present for several years.  [name] CAD (coronary artery disease)    a. Cath in the late 90's - reportedly ok;  b. 2014 s/p stenting x 2 @ [facility]; c 08/19/16 Cath/PCI with DES -> RCA, plan to treat LM 70% medically. Seen by surgery and felt to be too high risk for CABG; d. 08/2018 Cath: LM 70 (iFR 0.86), LAD 20ost, 40p, 50/28m D1 70, LCX patent stent, OM1 30, RCA patent stent, 421mSR, 40d, RPDA 30. PCWP 8. CO/CI 4.0/2.1.  . Marland Kitchenhronic combined systolic (congestive) and diastolic (congestive) heart failure (HCAccident   a. Previously reduced EF-->50% by echo in 2012;  b. 06/2015 Echo: EF 50-55%  c. 07/2016 Echo: EF 45-50%; d. 12/2017 Echo: EF 55%, Gr1 DD, mild MR; e. 08/2018 Echo: EF 35-40%, mildly dil PA.  . Marland Kitchenhronic kidney disease (CKD), stage IV (severe) (H[place]lenpool  . CML (chronic myelocytic leukemia) ([facility]CS[place]  . GERD (gastroesophageal reflux disease)   . GIB (gastrointestinal bleeding)    a. 12/2017 3 unit PRBC GIB in setting of coumadin-->Endo/colon multiple duodenal ulcers and a single bleeding ulcer in the proximal ascending colon status post hemostatic clipping x2.  . Gout   . Hyperlipemia   . Hypertension   . Hypothyroidism   . Iron deficiency anemia   . Ischemic cardiomyopathy    a. Previously reduced EF-->50% by echo in 2012;  b. 06/2015 Echo: EF 50-55%, Gr2 DD, mild MR, mildly dil LA, Ao sclerosis, mild TR;  c. 07/2016 Echo: EF 45-50%; d. 12/2017 Echo: EF 55%, Gr1 DD, mild MR; e. 08/2018 Echo: EF 35-40%.  . Migraine    "in  the 1960s" (08/15/2016)  . PAF (paroxysmal atrial fibrillation) (HCC)    a. ? Dx 2014-->s/p DCCV;  b. CHA2DS2VASc = 6--> Coumadin.  . Prostate cancer ([place])   . RBBB   . Type II diabetes mellitus ([place])    Past Surgical History:  Procedure Laterality Date  . CATARACT EXTRACTION W/ INTRAOCULAR LENS  IMPLANT, BILATERAL Bilateral   . COLONOSCOPY N/A 01/13/2018   Procedure: COLONOSCOPY;  Surgeon: Lin Landsman, MD;  Location:  [facility] ENDOSCOPY;  Service: Gastroenterology;  Laterality: N/A;  . COLONOSCOPY WITH PROPOFOL N/A 01/01/2018   Procedure: COLONOSCOPY WITH PROPOFOL;  Surgeon: Lin Landsman, MD;  Location: [facility] ENDOSCOPY;  Service: Gastroenterology;  Laterality: N/A;  . CORONARY ANGIOPLASTY WITH STENT PLACEMENT  09/2012   2 stents  . CORONARY STENT INTERVENTION N/A 08/19/2016   Procedure: Coronary Stent Intervention;  Surgeon: [name], MD;  Location: [place] CV LAB;  Service: Cardiovascular;  Laterality: N/A;  . CORONARY STENT INTERVENTION N/A 04/13/2019   Procedure: CORONARY STENT INTERVENTION;  Surgeon: [name], MD;  Location: [place] CV LAB;  Service: Cardiovascular;  Laterality: N/A;  . ESOPHAGEAL MANOMETRY N/A 12/16/2017   Procedure: ESOPHAGEAL MANOMETRY (EM);  Surgeon: Lin Landsman, MD;  Location: [facility] ENDOSCOPY;  Service: Gastroenterology;  Laterality: N/A;  . ESOPHAGOGASTRODUODENOSCOPY N/A 12/20/2016   Procedure: ESOPHAGOGASTRODUODENOSCOPY (EGD);  Surgeon: Lin Landsman, MD;  Location: [facility] ENDOSCOPY;  Service: Gastroenterology;  Laterality: N/A;  . ESOPHAGOGASTRODUODENOSCOPY N/A 01/13/2018   Procedure: ESOPHAGOGASTRODUODENOSCOPY (EGD);  Surgeon: Lin Landsman, MD;  Location: [facility] ENDOSCOPY;  Service: Gastroenterology;  Laterality: N/A;  . ESOPHAGOGASTRODUODENOSCOPY (EGD) WITH PROPOFOL N/A 11/03/2017   Procedure: ESOPHAGOGASTRODUODENOSCOPY (EGD) WITH PROPOFOL;  Surgeon: Lin Landsman, MD;  Location: [facility] ENDOSCOPY;  Service: Gastroenterology;  Laterality: N/A;  . EYE SURGERY    . HERNIA REPAIR     "navel"  . LEFT HEART CATH AND CORONARY ANGIOGRAPHY N/A 08/14/2016   Procedure: Left Heart Cath and Coronary Angiography;  Surgeon: [name], MD;  Location: [place] CV LAB;  Service: Cardiovascular;  Laterality: N/A;  . RIGHT/LEFT HEART CATH AND CORONARY ANGIOGRAPHY N/A 09/01/2018   Procedure: RIGHT/LEFT HEART CATH AND CORONARY ANGIOGRAPHY;   Surgeon: [name], MD;  Location: [place] CV LAB;  Service: Cardiovascular;  Laterality: N/A;  . RIGHT/LEFT HEART CATH AND CORONARY ANGIOGRAPHY N/A 04/13/2019   Procedure: RIGHT/LEFT HEART CATH AND CORONARY ANGIOGRAPHY;  Surgeon: [name], MD;  Location: [place] CV LAB;  Service: Cardiovascular;  Laterality: N/A;  . TOE AMPUTATION Right    "big toe"    Current Meds  Medication Sig  . ACCU-CHEK AVIVA PLUS test strip Use to check blood sugar up to 2 times daily  . Accu-Chek Softclix Lancets lancets Use to check blood sugar up to 2 times daily.  [name] acetaminophen (TYLENOL) 500 MG tablet Take 1,000 mg by mouth every 6 (six) hours as needed for moderate pain or headache.   . Alcohol Swabs 70 % PADS Use when checking blood sugar up to 3x daily  . allopurinol (ZYLOPRIM) 100 MG tablet Take 1 tablet (100 mg total) by mouth daily.  [name] aspirin 81 MG chewable tablet Chew 81 mg by mouth daily.  . Blood Glucose Monitoring Suppl (ACCU-CHEK AVIVA PLUS) w/Device KIT Use to check blood sugar up to 2 times daily  . bosutinib (BOSULIF) 100 MG tablet Take 100 mg by mouth at bedtime as needed (tired/not feeling well). Take with food.   . Dulaglutide (TRULICITY) 5.79 JK/8.2SU SOPN Inject 0.75 mg  into the skin once a week. (Patient taking differently: Inject 0.75 mg into the skin every Saturday. )  . empagliflozin (JARDIANCE) 10 MG TABS tablet Take 10 mg by mouth daily before breakfast.  . gabapentin (NEURONTIN) 300 MG capsule Take 2 capsules (600 mg total) by mouth 2 (two) times daily. Max dose = 1250m daily for kidney function  . Insulin Glargine ([name] KWIKPEN) 100 UNIT/ML SOPN Inject 38 Units into the skin daily.   . Insulin Pen Needle 31G X 5 MM MISC 31 g by Does not apply route as directed.  . isosorbide mononitrate (IMDUR) 60 MG 24 hr tablet Take 1.5 tablets (90 mg total) by mouth daily.  .Marland Kitchenlevothyroxine (SYNTHROID) 25 MCG tablet Take 1 tablet (25 mcg total) by mouth daily before  breakfast.  . losartan (COZAAR) 25 MG tablet Take 1 tablet (25 mg total) by mouth daily.  . Menthol-Zinc Oxide (GOLD BOND EX) Apply 1 application topically daily as needed (muscle pain).  . metoprolol succinate (TOPROL-XL) 25 MG 24 hr tablet Take 1 tablet (25 mg total) by mouth daily.  . Multiple Vitamin (MULTIVITAMIN WITH MINERALS) TABS tablet Take 1 tablet by mouth daily.  . nitroGLYCERIN (NITROSTAT) 0.4 MG SL tablet Place 1 tablet (0.4 mg total) under the tongue every 5 (five) minutes x 3 doses as needed for chest pain.  .Marland Kitchenomeprazole (PRILOSEC) 20 MG capsule Take 20 mg by mouth in the morning and at bedtime. Morning & afternoon  . Polyethyl Glycol-Propyl Glycol (SYSTANE OP) Place 1 drop into both eyes 3 (three) times daily as needed (dry/irritated eyes.).   .Marland Kitchenpolyethylene glycol (MIRALAX / GLYCOLAX) 17 g packet Take 17 g by mouth daily as needed for mild constipation.   . potassium chloride SA (KLOR-CON) 20 MEQ tablet Take 1 tablet (20 mEq total) by mouth daily.  .Marland KitchenPROVENTIL HFA 108 (90 Base) MCG/ACT inhaler Inhale 2 puffs into the lungs every 6 (six) hours as needed for wheezing or shortness of breath.  . ranolazine (RANEXA) 500 MG 12 hr tablet Take 1 tablet (500 mg total) by mouth 2 (two) times daily.  . rosuvastatin (CRESTOR) 5 MG tablet Take 1 tablet (5 mg total) by mouth daily. (Patient taking differently: Take 5 mg by mouth every evening. )  . torsemide (DEMADEX) 20 MG tablet Take 3 tablets (60 mg total) by mouth daily.  . traMADol (ULTRAM) 50 MG tablet Take 1-2 tablets (50-100 mg total) by mouth at bedtime as needed. (Patient taking differently: Take 50-100 mg by mouth at bedtime as needed (pain.). )  . warfarin (COUMADIN) 3 MG tablet Take 1 to 1 1/2 tablets by mouth daily as directed by the coumadin clinic (Patient taking differently: Take 3-4.5 mg by mouth See admin instructions. Take 1 tablet (3 mg) by mouth in the evenings on Sundays, Tuesdays, Wednesdays,Thursdays, & Saturdays. Take  1.5 tablets (4.5 mg) by mouth in the evenings on Mondays & Fridays.)  . [DISCONTINUED] hydrALAZINE (APRESOLINE) 50 MG tablet Take 1 tablet (50 mg total) by mouth 3 (three) times daily.    Allergies: Clopidogrel, Ciprofloxacin, Mirtazapine, and Spironolactone  Social History   Tobacco Use  . Smoking status: Former Smoker    Packs/day: 1.75    Years: 20.00    Pack years: 35.00    Types: Cigarettes    Start date: 1955    Quit date: 1975    Years since quitting: 46.5  . Smokeless tobacco: Former UNetwork engineer . Vaping Use: Never used  Substance  Use Topics  . Alcohol use: No    Comment: previously drank heavily - quit 1979.  . Drug use: No    Family History  Problem Relation Age of Onset  . Brain cancer Father   . Diabetes Sister   . Heart attack Sister     Review of Systems: A 12-system review of systems was performed and was negative except as noted in the HPI.  --------------------------------------------------------------------------------------------------  Physical Exam: BP 120/60 (BP Location: Right Arm, Patient Position: Sitting, Cuff Size: Large)   Pulse 87   Ht _0  (1.651 m)   Wt 205 lb (93 kg)   SpO2 97%   BMI 34.11 kg/m   General: NAD.  Accompanied by his wife. Neck: No obvious JVD, though evaluation is limited by body habitus. Lungs: Coarse breath sounds without wheezes or crackles. Heart: Regular rate and rhythm without murmurs, rubs, or gallops. Abdomen: Soft, nontender, nondistended. Extremities: Trace ankle edema.  EKG: Normal sinus rhythm with PVCs, right bundle branch block, and left anterior fascicular block.  PVCs are now present.  Otherwise, no significant change from prior tracing on 08/23/2019.  Lab Results  Component Value Date   WBC 6.8 09/06/2019   HGB 11.7 (L) 09/06/2019   HCT 38.5 (L) 09/06/2019   MCV 91.9 09/06/2019   PLT 204 09/06/2019    Lab Results  Component Value Date   NA 136 09/06/2019   K 3.7 09/06/2019   CL 105  09/06/2019   CO2 21 (L) 09/06/2019   BUN 19 09/06/2019   CREATININE 1.51 (H) 09/06/2019   GLUCOSE 169 (H) 09/06/2019   ALT 14 08/28/2018    Lab Results  Component Value Date   CHOL 110 08/28/2018   HDL 28 (L) 08/28/2018   LDLCALC 51 08/28/2018   TRIG 155 (H) 08/28/2018   CHOLHDL 3.9 08/28/2018    --------------------------------------------------------------------------------------------------  ASSESSMENT AND PLAN: Chronic HFrEF: [name] weight is up 3 to 5 pounds from his baseline.  He notes some increased sodium intake recently that may have contributed to this.  We attempted a ReDS vest reading today but were unable to obtain this due to technical issues with the device.  I encouraged [name] to limit his sodium intake and to increase his torsemide to 60 mg twice daily if his weight climbs further.  Once his weight is back down to 200 to 203 pounds, he can return to torsemide 60 mg daily.  I have encouraged him to move forward with CardioMEMS device, which has been scheduled to be placed by [name] on 09/23/2019.  Due to intermittent orthostatic lightheadedness, we have agreed to decrease hydralazine to 25 mg every 8 hours.  We will otherwise continue his current evidence-based heart failure therapy.  I agree that challenging [name] with Delene Loll will be challenging given his intermittent soft blood pressures.  Coronary artery disease: Mr. Caldwell denies chest pain.  His exertional dyspnea is stable.  We will continue aspirin and warfarin following our stent placement earlier this year.  I have encouraged [name] to continue with cardiac rehab.  No changes to his current antianginal regimen.  Hypertension: Blood pressure normal today.  As above, we will decrease hydralazine in the setting of intermittent orthostatic lightheadedness.  Hyperlipidemia: Continue rosuvastatin 5 mg daily.  Consider repeating lipid panel at her next visit, as it has been a year since  this was last checked.  LDL was well controlled at that time.  Follow-up: Return to clinic in 2 months.  [name], MD 09/15/2019 1:24 PM

## 2019-09-15 NOTE — Progress Notes (Signed)
Daily Session Note  Patient Details  Name: Lucas Caldwell MRN: [id number] Date of Birth: 10/15/1933 Referring Provider:     Cardiac Rehab from 05/20/2019 in [facility]  Referring Provider Bensimhon      Encounter Date: 09/15/2019  Check In:  Session Check In - 09/15/19 1312      Check-In   Supervising physician immediately available to respond to emergencies See telemetry face sheet for immediately available ER MD    Location [facility]    Staff Present  , RN BSN;Laureen Brown, BS, RRT, CPFT;Jessica Hawkins, MA, RCEP, CCRP, CCET    Virtual Visit No    Medication changes reported     No    Fall or balance concerns reported    No    Warm-up and Cool-down Performed on first and last piece of equipment    Resistance Training Performed Yes    VAD Patient? No    PAD/SET Patient? No      Pain Assessment   Currently in Pain? No/denies              Social History   Tobacco Use  Smoking Status Former Smoker  . Packs/day: 1.75  . Years: 20.00  . Pack years: 35.00  . Types: Cigarettes  . Start date: 1955  . Quit date: 1975  . Years since quitting: 46.5  Smokeless Tobacco Former User    Goals Met:  Independence with exercise equipment Exercise tolerated well No report of cardiac concerns or symptoms Strength training completed today  Goals Unmet:  Not Applicable  Comments: Pt able to follow exercise prescription today without complaint.  Will continue to monitor for progression.    Dr. Mark Miller is Medical Director for [facility] and [name]. 

## 2019-09-15 NOTE — Patient Instructions (Signed)
Medication Instructions:  Your physician has recommended you make the following change in your medication:  1-DECREASE Hydralazine to 25 mg by mouth three times a day. 2- If your weight is greater than 205 pounds, please INCREASE Torsemide to 60 mg by mouth two times a day until your weight decreases back down to 202 pounds. THEN resume Torsemide 60 mg by mouth once a day.  *If you need a refill on your cardiac medications before your next appointment, please call your pharmacy*   Follow-Up: At [facility], you and your health needs are our priority.  As part of our continuing mission to provide you with exceptional heart care, we have created designated Provider Care Teams.  These Care Teams include your primary Cardiologist (physician) and Advanced Practice Providers (APPs -  Physician Assistants and Nurse Practitioners) who all work together to provide you with the care you need, when you need it.  We recommend signing up for the patient portal called "MyChart".  Sign up information is provided on this After Visit Summary.  MyChart is used to connect with patients for Virtual Visits (Telemedicine).  Patients are able to view lab/test results, encounter notes, upcoming appointments, etc.  Non-urgent messages can be sent to your provider as well.   To learn more about what you can do with MyChart, go to [email or web address].    Your next appointment:   2 month(s)  The format for your next appointment:   In Person  Provider:    You may see [name], MD or one of the following Advanced Practice Providers on your designated Care Team:    [name], NP  [name], PA-C  [name], PA-C

## 2019-09-16 ENCOUNTER — Ambulatory Visit (HOSPITAL_COMMUNITY)
Admission: RE | Admit: 2019-09-16 | Discharge: 2019-09-16 | Disposition: A | Discharge status: Home / Self Care | Payer: Medicare HMO | Source: Ambulatory Visit | Attending: Internal Medicine | Admitting: Internal Medicine

## 2019-09-16 ENCOUNTER — Encounter: Payer: Medicare HMO | Attending: Internal Medicine

## 2019-09-16 DIAGNOSIS — E039 Hypothyroidism, unspecified: Secondary | ICD-10-CM | POA: Insufficient documentation

## 2019-09-16 DIAGNOSIS — E785 Hyperlipidemia, unspecified: Secondary | ICD-10-CM | POA: Insufficient documentation

## 2019-09-16 DIAGNOSIS — Z87891 Personal history of nicotine dependence: Secondary | ICD-10-CM | POA: Insufficient documentation

## 2019-09-16 DIAGNOSIS — N400 Enlarged prostate without lower urinary tract symptoms: Secondary | ICD-10-CM | POA: Insufficient documentation

## 2019-09-16 DIAGNOSIS — Z7901 Long term (current) use of anticoagulants: Secondary | ICD-10-CM | POA: Insufficient documentation

## 2019-09-16 DIAGNOSIS — Z7982 Long term (current) use of aspirin: Secondary | ICD-10-CM | POA: Insufficient documentation

## 2019-09-16 DIAGNOSIS — E119 Type 2 diabetes mellitus without complications: Secondary | ICD-10-CM | POA: Insufficient documentation

## 2019-09-16 DIAGNOSIS — I480 Paroxysmal atrial fibrillation: Secondary | ICD-10-CM | POA: Insufficient documentation

## 2019-09-16 DIAGNOSIS — Z79899 Other long term (current) drug therapy: Secondary | ICD-10-CM | POA: Insufficient documentation

## 2019-09-16 DIAGNOSIS — K219 Gastro-esophageal reflux disease without esophagitis: Secondary | ICD-10-CM | POA: Insufficient documentation

## 2019-09-16 DIAGNOSIS — Z794 Long term (current) use of insulin: Secondary | ICD-10-CM | POA: Insufficient documentation

## 2019-09-16 DIAGNOSIS — I5022 Chronic systolic (congestive) heart failure: Secondary | ICD-10-CM | POA: Diagnosis not present

## 2019-09-16 DIAGNOSIS — I110 Hypertensive heart disease with heart failure: Secondary | ICD-10-CM | POA: Insufficient documentation

## 2019-09-16 DIAGNOSIS — I5042 Chronic combined systolic (congestive) and diastolic (congestive) heart failure: Secondary | ICD-10-CM | POA: Insufficient documentation

## 2019-09-16 LAB — CBC
HCT: 37.7 % — ABNORMAL LOW (ref 39.0–52.0)
Hemoglobin: 11.5 g/dL — ABNORMAL LOW (ref 13.0–17.0)
MCH: 27.1 pg (ref 26.0–34.0)
MCHC: 30.5 g/dL (ref 30.0–36.0)
MCV: 88.9 fL (ref 80.0–100.0)
Platelets: 191 10*3/uL (ref 150–400)
RBC: 4.24 MIL/uL (ref 4.22–5.81)
RDW: 15.2 % (ref 11.5–15.5)
WBC: 8.3 10*3/uL (ref 4.0–10.5)
nRBC: 0 % (ref 0.0–0.2)

## 2019-09-16 LAB — BASIC METABOLIC PANEL
Anion gap: 12 (ref 5–15)
BUN: 17 mg/dL (ref 8–23)
CO2: 21 mmol/L — ABNORMAL LOW (ref 22–32)
Calcium: 8.7 mg/dL — ABNORMAL LOW (ref 8.9–10.3)
Chloride: 101 mmol/L (ref 98–111)
Creatinine, Ser: 1.59 mg/dL — ABNORMAL HIGH (ref 0.61–1.24)
GFR calc Af Amer: 46 mL/min — ABNORMAL LOW (ref >60)
GFR calc non Af Amer: 40 mL/min — ABNORMAL LOW (ref >60)
Glucose, Bld: 196 mg/dL — ABNORMAL HIGH (ref 70–99)
Potassium: 3.3 mmol/L — ABNORMAL LOW (ref 3.5–5.1)
Sodium: 134 mmol/L — ABNORMAL LOW (ref 135–145)

## 2019-09-16 LAB — PROTIME-INR
INR: 2.1 — ABNORMAL HIGH (ref 0.8–1.2)
Prothrombin Time: 22.6 seconds — ABNORMAL HIGH (ref 11.4–15.2)

## 2019-09-16 NOTE — Addendum Note (Signed)
Addended by: Britt Bottom on: 09/16/2019 11:44 AM   Modules accepted: Orders

## 2019-09-17 ENCOUNTER — Telehealth (HOSPITAL_COMMUNITY): Payer: Self-pay | Admitting: Cardiology

## 2019-09-17 NOTE — Telephone Encounter (Signed)
-----   Message from Jolaine Artist, MD sent at 09/17/2019 10:06 AM EDT ----- Add k20 daily. Repeat 1 week

## 2019-09-20 ENCOUNTER — Telehealth: Payer: Medicare HMO

## 2019-09-22 ENCOUNTER — Telehealth: Payer: Self-pay | Admitting: Internal Medicine

## 2019-09-22 ENCOUNTER — Other Ambulatory Visit: Payer: Self-pay | Admitting: Family Medicine

## 2019-09-22 ENCOUNTER — Ambulatory Visit (INDEPENDENT_AMBULATORY_CARE_PROVIDER_SITE_OTHER): Payer: Medicare HMO | Admitting: Pharmacist

## 2019-09-22 DIAGNOSIS — Z794 Long term (current) use of insulin: Secondary | ICD-10-CM | POA: Diagnosis not present

## 2019-09-22 DIAGNOSIS — E1142 Type 2 diabetes mellitus with diabetic polyneuropathy: Secondary | ICD-10-CM

## 2019-09-22 DIAGNOSIS — E1121 Type 2 diabetes mellitus with diabetic nephropathy: Secondary | ICD-10-CM

## 2019-09-22 NOTE — Telephone Encounter (Signed)
[facility] calling  Needs clarification on hydralazine prescription Please call to discuss

## 2019-09-22 NOTE — Telephone Encounter (Signed)
Spoke to pharmacy they are aware pt is to take Hydralazine 25 mg tablet TID.

## 2019-09-22 NOTE — Patient Instructions (Signed)
Thank you allowing the Chronic Care Management Team to be a part of your care! It was a pleasure speaking with you today!     CCM (Chronic Care Management) Team    Noreene Larsson RN, MSN, CCM Nurse Care Coordinator  [phone]   [name] PharmD  Clinical Pharmacist  [phone]   Eula Fried LCSW Clinical Social Worker [phone]  Visit Information  Goals Addressed            This Visit's Progress   . PharmD - Medication Managment       CARE PLAN ENTRY (see longitudinal plan of care for additional care plan information)  Current Barriers:  . Financial o Note patient has Partial Extra Help/LIS Subsidy . Limited vision . Chronic Disease Management support and education needs related to multiple chronic disease processes  Pharmacist Clinical Goal(s):  [name] Over the next 30 days, patient will work with CM Pharmacist to address needs related to medication adherence and diabetes management  Interventions: . Receive coordination of care message from PCP following office visit with patient on 6/23. ? Plan to increase Trulicity dose from 2.77 mg weekly to 1.5 mg weekly - change to occur when patient has used up current supply of Trulicity 4.12 mg strength. Plan to decrease patient's [name] dose at that time. ? Note patient receives both L[employer] through [employer] patient assistance program . Perform chart review . Follow up with patient's wife regarding medication adherence ? Note patient's wife manages weekly pillbox for patient ? Confirms patient decreased hydralazine to 25 mg three time daily as directed ? Confirms patient weighing himself daily and following instruction from Cardiologist for torsemide dose accordingly ? Confirms taking potassium chloride 20 mEq, now twice daily as directed by [name] (note patient previously taking potassium chloride 20 mEq once daily) ? Note patient scheduled for right heart catherization tomorrow ? Mrs.  Fikes reports has reviewed and is following instructions for upcoming procedure tomorrow, including specific instructions for medication adjustments . Discuss CBG control and monitoring o Reports patient taking:  [name] 38 untis daily  Trulicity 8.78 mg once weekly  Jardiance 10 mg once daily before breakfast o Patient reports recent AM CBGs mostly ranging: 130s-140s ? Encourage continuing to monitor and keep log of CBGs  ? Mrs. Stary reports patient currently has ~12 week supply of Trulicity remaining. Discuss plan to follow up with PCP when ~1 month supply remaining to request provider send in Rx for Trulicity 1.5 mg weekly to assistance program . Will collaborate with PCP  Patient Self Care Activities:  . Check blood sugar, blood pressure and daily weight as directed by providers  . Patient takes medications, with assistance of wife, as directed o Mrs. Zietlow manages patient's medications using weekly pillbox as adherence tool . Patient attends scheduled medical appointments  Please see past updates related to this goal by clicking on the "Past Updates" button in the selected goal         Patient verbalizes understanding of instructions provided today.   The care management team will reach out to the patient again over the next 30 days.   [name], PharmD, [place] [employer] [phone]

## 2019-09-22 NOTE — Chronic Care Management (AMB) (Signed)
Chronic Care Management   Follow Up Note   09/22/2019 Name: Lucas Caldwell MRN: [id number] DOB: 1933-10-15  Referred by: [name], DO Reason for referral : Chronic Care Management (Patient Phone Call)   Lucas Caldwell is a 86 y.o. year old male who is a primary care patient of [name], DO. The CCM team was consulted for assistance with chronic disease management and care coordination needs.    I reached out to Lucas Caldwell and his wife by phone today.   Review of patient status, including review of consultants reports, relevant laboratory and other test results, and collaboration with appropriate care team members and the patient's provider was performed as part of comprehensive patient evaluation and provision of chronic care management services.     Outpatient Encounter Medications as of 09/22/2019  Medication Sig  . Dulaglutide (TRULICITY) 2.56 LS/9.3TD SOPN Inject 0.75 mg into the skin once a week. (Patient taking differently: Inject 0.75 mg into the skin every Saturday. )  . empagliflozin (JARDIANCE) 10 MG TABS tablet Take 10 mg by mouth daily before breakfast.  . hydrALAZINE (APRESOLINE) 25 MG tablet Take 1 tablet (25 mg total) by mouth 3 (three) times daily.  . Insulin Glargine ([name] KWIKPEN) 100 UNIT/ML SOPN Inject 38 Units into the skin daily.   . potassium chloride SA (KLOR-CON) 20 MEQ tablet Take 1 tablet (20 mEq total) by mouth daily. (Patient taking differently: Take 20 mEq by mouth 2 (two) times daily. )  . torsemide (DEMADEX) 20 MG tablet Take 3 tablets (60 mg total) by mouth daily.  [name] ACCU-CHEK AVIVA PLUS test strip Use to check blood sugar up to 2 times daily  . Accu-Chek Softclix Lancets lancets Use to check blood sugar up to 2 times daily.  [name] acetaminophen (TYLENOL) 500 MG tablet Take 1,000 mg by mouth every 6 (six) hours as needed for moderate pain or headache.   . Alcohol Swabs 70 % PADS Use when checking blood sugar up to  3x daily  . allopurinol (ZYLOPRIM) 100 MG tablet Take 1 tablet (100 mg total) by mouth daily.  [name] aspirin 81 MG chewable tablet Chew 81 mg by mouth daily.  . Blood Glucose Monitoring Suppl (ACCU-CHEK AVIVA PLUS) w/Device KIT Use to check blood sugar up to 2 times daily  . bosutinib (BOSULIF) 100 MG tablet Take 100 mg by mouth at bedtime as needed (tired/not feeling well). Take with food.   . gabapentin (NEURONTIN) 300 MG capsule Take 2 capsules (600 mg total) by mouth 2 (two) times daily. Max dose = 1246m daily for kidney function  . Insulin Pen Needle 31G X 5 MM MISC 31 g by Does not apply route as directed.  . isosorbide mononitrate (IMDUR) 60 MG 24 hr tablet Take 1.5 tablets (90 mg total) by mouth daily.  .Marland Kitchenlevothyroxine (SYNTHROID) 25 MCG tablet Take 1 tablet (25 mcg total) by mouth daily before breakfast.  . losartan (COZAAR) 25 MG tablet Take 1 tablet (25 mg total) by mouth daily.  . Menthol-Zinc Oxide (GOLD BOND EX) Apply 1 application topically daily as needed (muscle pain).  . metoprolol succinate (TOPROL-XL) 25 MG 24 hr tablet Take 1 tablet (25 mg total) by mouth daily.  . Multiple Vitamin (MULTIVITAMIN WITH MINERALS) TABS tablet Take 1 tablet by mouth daily.  . nitroGLYCERIN (NITROSTAT) 0.4 MG SL tablet Place 1 tablet (0.4 mg total) under the tongue every 5 (five) minutes x 3 doses as needed for chest pain.  .[name]  omeprazole (PRILOSEC) 20 MG capsule Take 20 mg by mouth in the morning and at bedtime. Morning & afternoon  . Polyethyl Glycol-Propyl Glycol (SYSTANE OP) Place 1 drop into both eyes 3 (three) times daily as needed (dry/irritated eyes.).   [name] polyethylene glycol (MIRALAX / GLYCOLAX) 17 g packet Take 17 g by mouth daily as needed for mild constipation.   [name] PROVENTIL HFA 108 (90 Base) MCG/ACT inhaler Inhale 2 puffs into the lungs every 6 (six) hours as needed for wheezing or shortness of breath.  . ranolazine (RANEXA) 500 MG 12 hr tablet Take 1 tablet (500 mg total) by mouth 2 (two)  times daily.  . rosuvastatin (CRESTOR) 5 MG tablet Take 1 tablet (5 mg total) by mouth daily. (Patient taking differently: Take 5 mg by mouth every evening. )  . traMADol (ULTRAM) 50 MG tablet Take 1-2 tablets (50-100 mg total) by mouth at bedtime as needed. (Patient taking differently: Take 50-100 mg by mouth at bedtime as needed (pain.). )  . warfarin (COUMADIN) 3 MG tablet Take 1 to 1 1/2 tablets by mouth daily as directed by the coumadin clinic (Patient taking differently: Take 3-4.5 mg by mouth See admin instructions. Take 1 tablet (3 mg) by mouth in the evenings on Sundays, Tuesdays, Wednesdays,Thursdays, & Saturdays. Take 1.5 tablets (4.5 mg) by mouth in the evenings on Mondays & Fridays.)   No facility-administered encounter medications on file as of 09/22/2019.    Goals Addressed            This Visit's Progress   . PharmD - Medication Managment       CARE PLAN ENTRY (see longitudinal plan of care for additional care plan information)  Current Barriers:  . Financial o Note patient has Partial Extra Help/LIS Subsidy . Limited vision . Chronic Disease Management support and education needs related to multiple chronic disease processes  Pharmacist Clinical Goal(s):  [name] Over the next 30 days, patient will work with CM Pharmacist to address needs related to medication adherence and diabetes management  Interventions: . Receive coordination of care message from PCP following office visit with patient on 6/23. ? Plan to increase Trulicity dose from 3.23 mg weekly to 1.5 mg weekly - change to occur when patient has used up current supply of Trulicity 5.57 mg strength. Plan to decrease patient's [name] dose at that time. ? Note patient receives both Lobby[employer] through [employer] patient assistance program . Perform chart review ? Patient seen by Cardiology on 6/30 and advised to: "1-DECREASE Hydralazine to 25 mg by mouth three times a day 2- If your weight is greater than 205  pounds, please INCREASE Torsemide to 60 mg by mouth two times a day until your weight decreases back down to 202 pounds. THEN resume Torsemide 60 mg by mouth once a day." ? Per lab work note from [name] on 7/2, patient advised to "add k20 daily" and repeat labs in 1 week . Follow up with patient's wife regarding medication adherence ? Note patient's wife manages weekly pillbox for patient ? Confirms patient decreased hydralazine to 25 mg three time daily as directed ? Confirms patient weighing himself daily and following instruction from Cardiologist for torsemide dose accordingly ? Confirms taking potassium chloride 20 mEq, now twice daily as directed by [name] (note patient previously taking potassium chloride 20 mEq once daily) ? Note patient scheduled for right heart catherization tomorrow ? Mrs. Karapetyan reports has reviewed and is following instructions for upcoming procedure tomorrow,  including specific instructions for medication adjustments . Discuss CBG control and monitoring o Reports patient taking:  [name] 38 untis daily  Trulicity 9.19 mg once weekly  Jardiance 10 mg once daily before breakfast o Patient reports recent AM CBGs mostly ranging: 130s-140s ? Encourage continuing to monitor and keep log of CBGs  ? Mrs. Wrightsman reports patient currently has ~12 week supply of Trulicity remaining. Discuss plan to follow up with PCP when ~1 month supply remaining to request provider send in Rx for Trulicity 1.5 mg weekly to assistance program . Will collaborate with PCP  Patient Self Care Activities:  . Check blood sugar, blood pressure and daily weight as directed by providers  . Patient takes medications, with assistance of wife, as directed o Mrs. Osentoski manages patient's medications using weekly pillbox as adherence tool . Patient attends scheduled medical appointments  Please see past updates related to this goal by clicking on the "Past Updates" button in the  selected goal         Plan  The care management team will reach out to the patient again over the next 30 days.   [name], PharmD, [place] [employer] [phone]

## 2019-09-23 ENCOUNTER — Encounter (HOSPITAL_COMMUNITY)
Admission: RE | Disposition: A | Discharge status: Home / Self Care | Payer: Self-pay | Source: Home / Self Care | Attending: Internal Medicine

## 2019-09-23 ENCOUNTER — Telehealth: Payer: Medicare HMO

## 2019-09-23 ENCOUNTER — Other Ambulatory Visit: Payer: Self-pay

## 2019-09-23 ENCOUNTER — Ambulatory Visit (HOSPITAL_COMMUNITY)
Admission: RE | Admit: 2019-09-23 | Discharge: 2019-09-23 | Disposition: A | Discharge status: Home / Self Care | Payer: Medicare HMO | Attending: Internal Medicine | Admitting: Internal Medicine

## 2019-09-23 DIAGNOSIS — Z7982 Long term (current) use of aspirin: Secondary | ICD-10-CM | POA: Insufficient documentation

## 2019-09-23 DIAGNOSIS — E039 Hypothyroidism, unspecified: Secondary | ICD-10-CM | POA: Diagnosis not present

## 2019-09-23 DIAGNOSIS — H5461 Unqualified visual loss, right eye, normal vision left eye: Secondary | ICD-10-CM | POA: Diagnosis not present

## 2019-09-23 DIAGNOSIS — Z955 Presence of coronary angioplasty implant and graft: Secondary | ICD-10-CM | POA: Insufficient documentation

## 2019-09-23 DIAGNOSIS — E785 Hyperlipidemia, unspecified: Secondary | ICD-10-CM | POA: Diagnosis not present

## 2019-09-23 DIAGNOSIS — Z881 Allergy status to other antibiotic agents status: Secondary | ICD-10-CM | POA: Insufficient documentation

## 2019-09-23 DIAGNOSIS — I480 Paroxysmal atrial fibrillation: Secondary | ICD-10-CM | POA: Insufficient documentation

## 2019-09-23 DIAGNOSIS — Z79899 Other long term (current) drug therapy: Secondary | ICD-10-CM | POA: Insufficient documentation

## 2019-09-23 DIAGNOSIS — I2510 Atherosclerotic heart disease of native coronary artery without angina pectoris: Secondary | ICD-10-CM | POA: Diagnosis not present

## 2019-09-23 DIAGNOSIS — E1122 Type 2 diabetes mellitus with diabetic chronic kidney disease: Secondary | ICD-10-CM | POA: Diagnosis not present

## 2019-09-23 DIAGNOSIS — Z7901 Long term (current) use of anticoagulants: Secondary | ICD-10-CM | POA: Diagnosis not present

## 2019-09-23 DIAGNOSIS — Z8546 Personal history of malignant neoplasm of prostate: Secondary | ICD-10-CM | POA: Diagnosis not present

## 2019-09-23 DIAGNOSIS — I428 Other cardiomyopathies: Secondary | ICD-10-CM | POA: Diagnosis not present

## 2019-09-23 DIAGNOSIS — I130 Hypertensive heart and chronic kidney disease with heart failure and stage 1 through stage 4 chronic kidney disease, or unspecified chronic kidney disease: Secondary | ICD-10-CM | POA: Insufficient documentation

## 2019-09-23 DIAGNOSIS — I5022 Chronic systolic (congestive) heart failure: Secondary | ICD-10-CM

## 2019-09-23 DIAGNOSIS — N1831 Chronic kidney disease, stage 3a: Secondary | ICD-10-CM | POA: Diagnosis not present

## 2019-09-23 DIAGNOSIS — N400 Enlarged prostate without lower urinary tract symptoms: Secondary | ICD-10-CM | POA: Diagnosis not present

## 2019-09-23 DIAGNOSIS — Z794 Long term (current) use of insulin: Secondary | ICD-10-CM | POA: Insufficient documentation

## 2019-09-23 DIAGNOSIS — D509 Iron deficiency anemia, unspecified: Secondary | ICD-10-CM | POA: Insufficient documentation

## 2019-09-23 DIAGNOSIS — M109 Gout, unspecified: Secondary | ICD-10-CM | POA: Insufficient documentation

## 2019-09-23 DIAGNOSIS — Z87891 Personal history of nicotine dependence: Secondary | ICD-10-CM | POA: Insufficient documentation

## 2019-09-23 DIAGNOSIS — R0683 Snoring: Secondary | ICD-10-CM | POA: Insufficient documentation

## 2019-09-23 DIAGNOSIS — K219 Gastro-esophageal reflux disease without esophagitis: Secondary | ICD-10-CM | POA: Diagnosis not present

## 2019-09-23 DIAGNOSIS — Z79890 Hormone replacement therapy (postmenopausal): Secondary | ICD-10-CM | POA: Diagnosis not present

## 2019-09-23 HISTORY — PX: RIGHT HEART CATH: CATH118263

## 2019-09-23 HISTORY — PX: PRESSURE SENSOR/CARDIOMEMS: CATH118258

## 2019-09-23 LAB — POCT I-STAT EG7
Acid-Base Excess: 2 mmol/L (ref 0.0–2.0)
Acid-Base Excess: 3 mmol/L — ABNORMAL HIGH (ref 0.0–2.0)
Bicarbonate: 27.4 mmol/L (ref 20.0–28.0)
Bicarbonate: 29.6 mmol/L — ABNORMAL HIGH (ref 20.0–28.0)
Calcium, Ion: 1.08 mmol/L — ABNORMAL LOW (ref 1.15–1.40)
Calcium, Ion: 1.23 mmol/L (ref 1.15–1.40)
HCT: 34 % — ABNORMAL LOW (ref 39.0–52.0)
HCT: 36 % — ABNORMAL LOW (ref 39.0–52.0)
Hemoglobin: 11.6 g/dL — ABNORMAL LOW (ref 13.0–17.0)
Hemoglobin: 12.2 g/dL — ABNORMAL LOW (ref 13.0–17.0)
O2 Saturation: 71 %
O2 Saturation: 72 %
Potassium: 4 mmol/L (ref 3.5–5.1)
Potassium: 4.4 mmol/L (ref 3.5–5.1)
Sodium: 142 mmol/L (ref 135–145)
Sodium: 146 mmol/L — ABNORMAL HIGH (ref 135–145)
TCO2: 29 mmol/L (ref 22–32)
TCO2: 31 mmol/L (ref 22–32)
pCO2, Ven: 47.2 mmHg (ref 44.0–60.0)
pCO2, Ven: 50.3 mmHg (ref 44.0–60.0)
pH, Ven: 7.373 (ref 7.250–7.430)
pH, Ven: 7.377 (ref 7.250–7.430)
pO2, Ven: 38 mmHg (ref 32.0–45.0)
pO2, Ven: 40 mmHg (ref 32.0–45.0)

## 2019-09-23 LAB — GLUCOSE, CAPILLARY
Glucose-Capillary: 162 mg/dL — ABNORMAL HIGH (ref 70–99)
Glucose-Capillary: 124 mg/dL — ABNORMAL HIGH (ref 70–99)

## 2019-09-23 LAB — POTASSIUM: Potassium: 4.4 mmol/L (ref 3.5–5.1)

## 2019-09-23 LAB — PROTIME-INR
INR: 1.1 (ref 0.8–1.2)
Prothrombin Time: 13.8 seconds (ref 11.4–15.2)

## 2019-09-23 SURGERY — RIGHT HEART CATH
Anesthesia: LOCAL

## 2019-09-23 MED ORDER — SODIUM CHLORIDE 0.9% FLUSH: 3.0000 mL | Freq: Two times a day (BID) | INTRAVENOUS | Status: DC

## 2019-09-23 MED ORDER — LABETALOL HCL 5 MG/ML IV SOLN: 10.0000 mg | INTRAVENOUS | Status: DC | PRN

## 2019-09-23 MED ORDER — SODIUM CHLORIDE 0.9 % IV SOLN: INTRAVENOUS | Status: DC

## 2019-09-23 MED ORDER — FENTANYL CITRATE (PF) 100 MCG/2ML IJ SOLN
INTRAMUSCULAR | Status: DC | PRN
  Administered 2019-09-23: 25 ug via INTRAVENOUS

## 2019-09-23 MED ORDER — HYDRALAZINE HCL 20 MG/ML IJ SOLN: 10.0000 mg | INTRAMUSCULAR | Status: DC | PRN

## 2019-09-23 MED ORDER — LIDOCAINE HCL (PF) 1 % IJ SOLN
INTRAMUSCULAR | Status: DC | PRN
  Administered 2019-09-23: 15 mL

## 2019-09-23 MED ORDER — SODIUM CHLORIDE 0.9 % IV SOLN: 250.0000 mL | INTRAVENOUS | Status: DC | PRN

## 2019-09-23 MED ORDER — MIDAZOLAM HCL 2 MG/2ML IJ SOLN
INTRAMUSCULAR | Status: DC | PRN
  Administered 2019-09-23: 2 mg via INTRAVENOUS

## 2019-09-23 MED ORDER — IOHEXOL 350 MG/ML SOLN
INTRAVENOUS | Status: DC | PRN
  Administered 2019-09-23: 10 mL

## 2019-09-23 MED ORDER — HEPARIN (PORCINE) IN NACL 1000-0.9 UT/500ML-% IV SOLN
INTRAVENOUS | Status: DC | PRN
  Administered 2019-09-23: 500 mL

## 2019-09-23 MED ORDER — HEPARIN (PORCINE) IN NACL 1000-0.9 UT/500ML-% IV SOLN
INTRAVENOUS | Status: AC
  Filled 2019-09-23: qty 500

## 2019-09-23 MED ORDER — ACETAMINOPHEN 325 MG PO TABS: 650.0000 mg | ORAL_TABLET | ORAL | Status: DC | PRN

## 2019-09-23 MED ORDER — ASPIRIN 81 MG PO CHEW: 81.0000 mg | CHEWABLE_TABLET | ORAL | Status: DC

## 2019-09-23 MED ORDER — FENTANYL CITRATE (PF) 100 MCG/2ML IJ SOLN
INTRAMUSCULAR | Status: AC
  Filled 2019-09-23: qty 2

## 2019-09-23 MED ORDER — LIDOCAINE HCL (PF) 1 % IJ SOLN
INTRAMUSCULAR | Status: AC
  Filled 2019-09-23: qty 30

## 2019-09-23 MED ORDER — SODIUM CHLORIDE 0.9% FLUSH: 3.0000 mL | INTRAVENOUS | Status: DC | PRN

## 2019-09-23 MED ORDER — MIDAZOLAM HCL 2 MG/2ML IJ SOLN
INTRAMUSCULAR | Status: AC
  Filled 2019-09-23: qty 2

## 2019-09-23 MED ORDER — ONDANSETRON HCL 4 MG/2ML IJ SOLN: 4.0000 mg | Freq: Four times a day (QID) | INTRAMUSCULAR | Status: DC | PRN

## 2019-09-23 SURGICAL SUPPLY — 16 items
CARDIOMEMS PA SENSOR W/DELIVER (Prosthesis & Implant Heart) ×2 IMPLANT
CATH SWAN GANZ 7F STRAIGHT (CATHETERS) ×2 IMPLANT
DILATOR VESSEL 10FR 20CM (INTRODUCER) ×2 IMPLANT
DILATOR VESSEL 38 20CM 8FR (INTRODUCER) ×2 IMPLANT
PACK CARDIAC CATHETERIZATION (CUSTOM PROCEDURE TRAY) ×2 IMPLANT
PROTECTION STATION PRESSURIZED (MISCELLANEOUS) ×2
SENSOR CARDIOMEMS PA W/DELIVER (Prosthesis & Implant Heart) ×1 IMPLANT
SHEATH FAST CATH 12F 12CM (SHEATH) ×2 IMPLANT
SHEATH PROBE COVER 6X72 (BAG) ×2 IMPLANT
STATION PROTECTION PRESSURIZED (MISCELLANEOUS) ×1 IMPLANT
TRANSDUCER W/STOPCOCK (MISCELLANEOUS) ×2 IMPLANT
TUBING ART PRESS 72  MALE/FEM (TUBING) ×1
TUBING ART PRESS 72 MALE/FEM (TUBING) ×1 IMPLANT
WIRE EMERALD 3MM-J .025X260CM (WIRE) ×2 IMPLANT
WIRE EMERALD 3MM-J .035X150CM (WIRE) ×2 IMPLANT
WIRE NITREX .018X300 STIFF (WIRE) ×2 IMPLANT

## 2019-09-23 NOTE — Interval H&P Note (Signed)
History and Physical Interval Note:  09/23/2019 1:14 PM  Lucas Caldwell  has presented today for surgery, with the diagnosis of heart failure.  The various methods of treatment have been discussed with the patient and family. After consideration of risks, benefits and other options for treatment, the patient has consented to  Procedure(s): RIGHT HEART CATH (N/A) and Cardiomems implant as a surgical intervention.  The patient's history has been reviewed, patient examined, no change in status, stable for surgery.  I have reviewed the patient's chart and labs.  Questions were answered to the patient's satisfaction.     Lizbeth Feijoo

## 2019-09-23 NOTE — Discharge Instructions (Signed)
Femoral Site Care This sheet gives you information about how to care for yourself after your procedure. Your health care provider may also give you more specific instructions. If you have problems or questions, contact your health care provider. What can I expect after the procedure?  After the procedure, it is common to have:  Bruising that usually fades within 1-2 weeks.  Tenderness at the site. Follow these instructions at home: Wound care 1. Follow instructions from your health care provider about how to take care of your insertion site. Make sure you: ? Wash your hands with soap and water before you change your bandage (dressing). If soap and water are not available, use hand sanitizer. ? Remove your dressing as told by your health care provider. In 24 hours 2. Do not take baths, swim, or use a hot tub until your health care provider approves. 3. You may shower 24-48 hours after the procedure or as told by your health care provider. ? Gently wash the site with plain soap and water. ? Pat the area dry with a clean towel. ? Do not rub the site. This may cause bleeding. 4. Do not apply powder or lotion to the site. Keep the site clean and dry. 5. Check your femoral site every day for signs of infection. Check for: ? Redness, swelling, or pain. ? Fluid or blood. ? Warmth. ? Pus or a bad smell. Activity 1. For the first 2-3 days after your procedure, or as long as directed: ? Avoid climbing stairs as much as possible. ? Do not squat. 2. Do not lift anything that is heavier than 10 lb (4.5 kg), or the limit that you are told, until your health care provider says that it is safe. For 5 days 3. Rest as directed. ? Avoid sitting for a long time without moving. Get up to take short walks every 1-2 hours. 4. Do not drive for 24 hours if you were given a medicine to help you relax (sedative). General instructions  Take over-the-counter and prescription medicines only as told by your  health care provider.  Keep all follow-up visits as told by your health care provider. This is important. Contact a health care provider if you have:  A fever or chills.  You have redness, swelling, or pain around your insertion site. Get help right away if:  The catheter insertion area swells very fast.  You pass out.  You suddenly start to sweat or your skin gets clammy.  The catheter insertion area is bleeding, and the bleeding does not stop when you hold steady pressure on the area. These symptoms may represent a serious problem that is an emergency. Do not wait to see if the symptoms will go away. Get medical help right away. Call your local emergency services (911 in the [place] Do not drive yourself to the hospital. Summary  After the procedure, it is common to have bruising that usually fades within 1-2 weeks.  Check your femoral site every day for signs of infection.  Do not lift anything that is heavier than 10 lb (4.5 kg), or the limit that you are told, until your health care provider says that it is safe. This information is not intended to replace advice given to you by your health care provider. Make sure you discuss any questions you have with your health care provider. Document Revised: 03/17/2017 Document Reviewed: 03/17/2017 Elsevier Patient Education  2020 [employer].

## 2019-09-24 ENCOUNTER — Encounter (HOSPITAL_COMMUNITY): Payer: Self-pay | Admitting: Internal Medicine

## 2019-09-24 ENCOUNTER — Telehealth: Payer: Self-pay | Admitting: Internal Medicine

## 2019-09-24 DIAGNOSIS — J9611 Chronic respiratory failure with hypoxia: Secondary | ICD-10-CM

## 2019-09-24 NOTE — Telephone Encounter (Signed)
Patient was taken off his coumadin on 7/3 for surgery that was done on 7/8. Patients wife is calling in to see what dosage he should start back on today  Please advise

## 2019-09-24 NOTE — Telephone Encounter (Signed)
Spoke to pt's spouse, [name](DPR).  [name] is requesting that we place an order to [facility] to d/c pt's oxygen. Pt does not feel that he needs oxygen any longer.  [name] is aware that Dr. Mortimer Fries is out of the office and will be available on 09/27/2019.  Dr. Mortimer Fries, please advise. Thanks

## 2019-09-24 NOTE — Telephone Encounter (Signed)
Pts wife [name] called because she called [facility] to let them know Lucas Caldwell does not need the oxygen machine, but they told him that DK has to send a letter to [place] stating that he does not need the machine so that they can pick it up.

## 2019-09-24 NOTE — Telephone Encounter (Signed)
Returned call to the wife, Khale Nigh, and she states he had to hold warfarin for a few days for heart cath procedure and they did not remember how to take his Warfarin today and tomorrow since it is normally special doses post procedure.  Advised to have pt to take an extra 1/2 tablet today and tomorrow of Warfarin, which is, Warfarin 2 tablets (6mg ) today and 1.5 tablets (4.5mg ) Warfarin tomorrow then resume taking 1 tablet daily except 1.5 tablets on Mondays and Fridays and recheck INR on 09/29/2019. She wrote the instructions down and read them back and confirmed it was correct.

## 2019-09-27 ENCOUNTER — Encounter: Payer: Self-pay | Admitting: *Deleted

## 2019-09-27 DIAGNOSIS — L97511 Non-pressure chronic ulcer of other part of right foot limited to breakdown of skin: Secondary | ICD-10-CM | POA: Diagnosis not present

## 2019-09-27 DIAGNOSIS — I5042 Chronic combined systolic (congestive) and diastolic (congestive) heart failure: Secondary | ICD-10-CM

## 2019-09-27 DIAGNOSIS — E11621 Type 2 diabetes mellitus with foot ulcer: Secondary | ICD-10-CM | POA: Diagnosis not present

## 2019-09-27 DIAGNOSIS — E1142 Type 2 diabetes mellitus with diabetic polyneuropathy: Secondary | ICD-10-CM | POA: Diagnosis not present

## 2019-09-27 NOTE — Telephone Encounter (Signed)
If patient no longer wants oxygen, he will need to understand the risks of not wearing oxygen, heart problems, breathing problems can lead to death/stroke

## 2019-09-27 NOTE — Telephone Encounter (Signed)
Pt is aware risk of d/c oxygen. Pt voiced his understanding and had no further questions.  Order has been placed to [facility] to d/c oxygen.  Nothing further is needed at this time.

## 2019-09-28 ENCOUNTER — Telehealth: Payer: Self-pay | Admitting: *Deleted

## 2019-09-28 NOTE — Telephone Encounter (Signed)
-----   Message from Jolaine Artist, MD sent at 09/27/2019  6:55 PM EDT ----- Regarding: RE: Clearance to return to rehab [name] cleared to return ASAP. ----- Message ----- From: Clotilde Dieter Sent: 09/27/2019   2:11 PM EDT To: Jolaine Artist, MD Subject: Clearance to return to rehab                   [name], We noted that Mr. Harts had a right heart cath on 7/8.  [name] currently enrolled in cardiac rehab, but has not attended since 6/30.  When can he return to rehab since his cath last week?   Please advise. Thank you for all you help!! Alberteen Sam, MA, RCEP, CCRP 09/27/2019 2:13 PM

## 2019-09-29 ENCOUNTER — Other Ambulatory Visit: Payer: Self-pay

## 2019-09-29 ENCOUNTER — Ambulatory Visit (INDEPENDENT_AMBULATORY_CARE_PROVIDER_SITE_OTHER): Payer: Medicare HMO

## 2019-09-29 ENCOUNTER — Encounter: Payer: Self-pay | Admitting: *Deleted

## 2019-09-29 DIAGNOSIS — I5042 Chronic combined systolic (congestive) and diastolic (congestive) heart failure: Secondary | ICD-10-CM

## 2019-09-29 DIAGNOSIS — Z7901 Long term (current) use of anticoagulants: Secondary | ICD-10-CM

## 2019-09-29 DIAGNOSIS — I480 Paroxysmal atrial fibrillation: Secondary | ICD-10-CM | POA: Diagnosis not present

## 2019-09-29 LAB — POCT INR: INR: 1.4 — AB (ref 2.0–3.0)

## 2019-09-29 NOTE — Progress Notes (Signed)
Cardiac Individual Treatment Plan  Patient Details  Name: TODDRICK SANNA MRN: [id number] Date of Birth: 1933-06-28 Referring Provider:     Cardiac Rehab from 05/20/2019 in [facility] Cardiac and Pulmonary Rehab  Referring Provider Bensimhon      Initial Encounter Date:    Cardiac Rehab from 05/20/2019 in [facility] Cardiac and Pulmonary Rehab  Date 05/20/19      Visit Diagnosis: Chronic combined systolic and diastolic congestive heart failure ([place])  Patient's Home Medications on Admission:  Current Outpatient Medications:  .  ACCU-CHEK AVIVA PLUS test strip, Use to check blood sugar up to 2 times daily, Disp: 200 each, Rfl: 3 .  Accu-Chek Softclix Lancets lancets, Use to check blood sugar up to 2 times daily., Disp: 200 each, Rfl: 3 .  acetaminophen (TYLENOL) 500 MG tablet, Take 1,000 mg by mouth every 6 (six) hours as needed for moderate pain or headache. , Disp: , Rfl:  .  Alcohol Swabs 70 % PADS, Use when checking blood sugar up to 3x daily, Disp: 100 each, Rfl: 11 .  allopurinol (ZYLOPRIM) 100 MG tablet, Take 1 tablet (100 mg total) by mouth daily., Disp: 90 tablet, Rfl: 3 .  aspirin 81 MG chewable tablet, Chew 81 mg by mouth daily., Disp: , Rfl:  .  Blood Glucose Monitoring Suppl (ACCU-CHEK AVIVA PLUS) w/Device KIT, Use to check blood sugar up to 2 times daily, Disp: 1 kit, Rfl: 0 .  bosutinib (BOSULIF) 100 MG tablet, Take 100 mg by mouth at bedtime as needed (tired/not feeling well). Take with food. , Disp: , Rfl:  .  Dulaglutide (TRULICITY) 5.37 SM/2.7MB SOPN, Inject 0.75 mg into the skin once a week. (Patient taking differently: Inject 0.75 mg into the skin every Saturday. ), Disp: 4 pen, Rfl: 0 .  empagliflozin (JARDIANCE) 10 MG TABS tablet, Take 10 mg by mouth daily before breakfast., Disp: 90 tablet, Rfl: 3 .  gabapentin (NEURONTIN) 300 MG capsule, Take 2 capsules (600 mg total) by mouth 2 (two) times daily. Max dose = 1210m daily for kidney function, Disp: 360 capsule, Rfl: 1 .   hydrALAZINE (APRESOLINE) 25 MG tablet, Take 1 tablet (25 mg total) by mouth 3 (three) times daily., Disp: 270 tablet, Rfl: 1 .  Insulin Glargine ([name] KWIKPEN) 100 UNIT/ML SOPN, Inject 38 Units into the skin daily. , Disp: , Rfl:  .  Insulin Pen Needle 31G X 5 MM MISC, 31 g by Does not apply route as directed., Disp: 100 each, Rfl: 5 .  isosorbide mononitrate (IMDUR) 60 MG 24 hr tablet, Take 1.5 tablets (90 mg total) by mouth daily., Disp: 135 tablet, Rfl: 2 .  levothyroxine (SYNTHROID) 25 MCG tablet, Take 1 tablet (25 mcg total) by mouth daily before breakfast., Disp: 90 tablet, Rfl: 1 .  losartan (COZAAR) 25 MG tablet, Take 1 tablet (25 mg total) by mouth daily., Disp: 90 tablet, Rfl: 1 .  Menthol-Zinc Oxide (GOLD BOND EX), Apply 1 application topically daily as needed (muscle pain)., Disp: , Rfl:  .  metoprolol succinate (TOPROL-XL) 25 MG 24 hr tablet, Take 1 tablet (25 mg total) by mouth daily., Disp: 90 tablet, Rfl: 1 .  Multiple Vitamin (MULTIVITAMIN WITH MINERALS) TABS tablet, Take 1 tablet by mouth daily., Disp: , Rfl:  .  nitroGLYCERIN (NITROSTAT) 0.4 MG SL tablet, Place 1 tablet (0.4 mg total) under the tongue every 5 (five) minutes x 3 doses as needed for chest pain., Disp: 25 tablet, Rfl: 4 .  omeprazole (PRILOSEC) 20 MG capsule,  Take 20 mg by mouth in the morning and at bedtime. Morning & afternoon, Disp: , Rfl:  .  Polyethyl Glycol-Propyl Glycol (SYSTANE OP), Place 1 drop into both eyes 3 (three) times daily as needed (dry/irritated eyes.). , Disp: , Rfl:  .  polyethylene glycol (MIRALAX / GLYCOLAX) 17 g packet, Take 17 g by mouth daily as needed for mild constipation. , Disp: , Rfl:  .  potassium chloride SA (KLOR-CON) 20 MEQ tablet, Take 1 tablet (20 mEq total) by mouth daily. (Patient taking differently: Take 20 mEq by mouth 2 (two) times daily. ), Disp: 90 tablet, Rfl: 3 .  PROVENTIL HFA 108 (90 Base) MCG/ACT inhaler, Inhale 2 puffs into the lungs every 6 (six) hours as needed for  wheezing or shortness of breath., Disp: , Rfl:  .  ranolazine (RANEXA) 500 MG 12 hr tablet, Take 1 tablet (500 mg total) by mouth 2 (two) times daily., Disp: 180 tablet, Rfl: 2 .  rosuvastatin (CRESTOR) 5 MG tablet, Take 1 tablet (5 mg total) by mouth daily. (Patient taking differently: Take 5 mg by mouth every evening. ), Disp: 90 tablet, Rfl: 3 .  torsemide (DEMADEX) 20 MG tablet, Take 3 tablets (60 mg total) by mouth daily., Disp: 180 tablet, Rfl: 2 .  traMADol (ULTRAM) 50 MG tablet, Take 1-2 tablets (50-100 mg total) by mouth at bedtime as needed. (Patient taking differently: Take 50-100 mg by mouth at bedtime as needed (pain.). ), Disp: 30 tablet, Rfl: 2 .  warfarin (COUMADIN) 3 MG tablet, Take 1 to 1 1/2 tablets by mouth daily as directed by the coumadin clinic (Patient taking differently: Take 3-4.5 mg by mouth See admin instructions. Take 1 tablet (3 mg) by mouth in the evenings on Sundays, Tuesdays, Wednesdays,Thursdays, & Saturdays. Take 1.5 tablets (4.5 mg) by mouth in the evenings on Mondays & Fridays.), Disp: 120 tablet, Rfl: 0  Past Medical History: Past Medical History:  Diagnosis Date  . Arthritis    "probably in his legs" (08/15/2016)  . Blind right eye   . BPH (benign prostatic hyperplasia)   . Breast asymmetry    Left breast is larger, present for several years.  [name] CAD (coronary artery disease)    a. Cath in the late 90's - reportedly ok;  b. 2014 s/p stenting x 2 @ [facility]; c 08/19/16 Cath/PCI with DES -> RCA, plan to treat LM 70% medically. Seen by surgery and felt to be too high risk for CABG; d. 08/2018 Cath: LM 70 (iFR 0.86), LAD 20ost, 40p, 50/73m D1 70, LCX patent stent, OM1 30, RCA patent stent, 443mSR, 40d, RPDA 30. PCWP 8. CO/CI 4.0/2.1.  . Marland Kitchenhronic combined systolic (congestive) and diastolic (congestive) heart failure ([facility]   a. Previously reduced EF-->50% by echo in 2012;  b. 06/2015 Echo: EF 50-55%  c. 07/2016 Echo: EF 45-50%; d. 12/2017 Echo: EF 55%, Gr1 DD, mild MR; e.  08/2018 Echo: EF 35-40%, mildly dil PA.  . Marland Kitchenhronic kidney disease (CKD), stage IV (severe) ([facility] [place]  . CML (chronic myelocytic leukemia) ([place]  . GERD (gastroesophageal reflux disease)   . GIB (gastrointestinal bleeding)    a. 12/2017 3 unit PRBC GIB in setting of coumadin-->Endo/colon multiple duodenal ulcers and a single bleeding ulcer in the proximal ascending colon status post hemostatic clipping x2.  . Gout   . Hyperlipemia   . Hypertension   . Hypothyroidism   . Iron deficiency anemia   . Ischemic cardiomyopathy    a. Previously  reduced EF-->50% by echo in 2012;  b. 06/2015 Echo: EF 50-55%, Gr2 DD, mild MR, mildly dil LA, Ao sclerosis, mild TR;  c. 07/2016 Echo: EF 45-50%; d. 12/2017 Echo: EF 55%, Gr1 DD, mild MR; e. 08/2018 Echo: EF 35-40%.  . Migraine    "in the 1960s" (08/15/2016)  . PAF (paroxysmal atrial fibrillation) (HCC)    a. ? Dx 2014-->s/p DCCV;  b. CHA2DS2VASc = 6--> Coumadin.  . Prostate cancer ([place])   . RBBB   . Type II diabetes mellitus (HCC)     Tobacco Use: Social History   Tobacco Use  Smoking Status Former Smoker  . Packs/day: 1.75  . Years: 20.00  . Pack years: 35.00  . Types: Cigarettes  . Start date: 96  . Quit date: 85  . 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   Exercise Prescription  Goal: Initial exercise prescription builds to 30-45 minutes a day of aerobic activity, 2-3 days per week.  Home exercise guidelines will be given to patient during program as part of exercise prescription that the participant will acknowledge.   Education: Aerobic Exercise & Resistance Training: - Gives group verbal and written instruction on the various components of exercise. Focuses on aerobic and resistive training programs and the benefits of this training and how to safely progress through these programs..   Education: Exercise & Equipment Safety: - Individual verbal instruction and demonstration of equipment use and safety with use of the equipment.   Cardiac Rehab from 05/20/2019 in [facility] Cardiac and Pulmonary Rehab  Date 05/20/19  Educator AS  Instruction Review Code 1- Verbalizes Understanding      Education: Exercise Physiology & General Exercise Guidelines: - Group verbal and written instruction with models to review the exercise physiology of the cardiovascular system and associated critical values. Provides general exercise guidelines with specific guidelines to those with heart or lung disease.    Education: Flexibility, Balance, Mind/Body Relaxation: Provides group verbal/written instruction on the benefits of flexibility and balance training, including mind/body exercise modes such as yoga, pilates and tai chi.  Demonstration and skill practice provided.   Activity Barriers & Risk Stratification:  Activity Barriers & Cardiac Risk Stratification - 05/19/19 0913      Activity Barriers & Cardiac Risk Stratification   Activity Barriers Arthritis;Joint Problems;Deconditioning;Muscular Weakness;Shortness of Breath;Assistive Device   knee pain, uses walker   Cardiac Risk Stratification High           6 Minute Walk:  6 Minute Walk    Row Name 05/20/19 1539         6 Minute Walk  Distance 715 feet     Walk Time 5.35 minutes     # of Rest Breaks 1     MPH 1.54      METS 1.14     RPE 15     Perceived Dyspnea  2     VO2 Peak 4     Symptoms Yes (comment)     Comments legs "going to sleep" from knees down     Resting HR 95 bpm     Resting BP 114/70     Resting Oxygen Saturation  98 %     Exercise Oxygen Saturation  during 6 min walk 92 %     Max Ex. HR 107 bpm     Max Ex. BP 154/68     2 Minute Post BP 124/68            Oxygen Initial Assessment:   Oxygen Re-Evaluation:   Oxygen Discharge (Final Oxygen Re-Evaluation):   Initial Exercise Prescription:  Initial Exercise Prescription - 05/20/19 1500      Date of Initial Exercise RX and Referring Provider   Date 05/20/19    Referring Provider Bensimhon      Treadmill   MPH 1    Grade 0    Minutes 15      REL-XR   Level 1    Speed 50    Minutes 15    METs 1      Prescription Details   Frequency (times per week) 3    Duration Progress to 30 minutes of continuous aerobic without signs/symptoms of physical distress      Intensity   THRR 40-80% of Max Heartrate 111-128    Ratings of Perceived Exertion 11-13    Perceived Dyspnea 0-4      Resistance Training   Training Prescription Yes    Weight 3 lb    Reps 10-15           Perform Capillary Blood Glucose checks as needed.  Exercise Prescription Changes:  Exercise Prescription Changes    Row Name 05/20/19 1500 06/09/19 1100 06/22/19 1500 07/07/19 1100 07/20/19 1500     Response to Exercise   Blood Pressure (Admit) 114/70 110/70 140/82 130/64 126/64   Blood Pressure (Exercise) 154/68 120/80 130/70 140/72 128/74   Blood Pressure (Exit) 124/68 122/82 124/58 124/62 114/68   Heart Rate (Admit) 95 bpm 56 bpm 87 bpm 95 bpm 94 bpm   Heart Rate (Exercise) 107 bpm 126 bpm 116 bpm 127 bpm 116 bpm   Heart Rate (Exit) 90 bpm 109 bpm 95 bpm 111 bpm 97 bpm   Oxygen Saturation (Admit) 98 % -- -- -- --   Oxygen Saturation (Exercise) 92 % -- -- -- --   Oxygen Saturation (Exit) 96 % -- -- -- --   Rating of Perceived Exertion  (Exercise) '15 11 12 12 12   ' Perceived Dyspnea (Exercise) 2 -- -- -- --   Symptoms legs "going to sleep" none none none none   Comments L shoulder old injury second full day of exericse -- -- --   Duration -- Progress to 30 minutes of  aerobic without signs/symptoms of physical distress Progress to 30 minutes of  aerobic without signs/symptoms of physical distress Progress to 30 minutes of  aerobic without signs/symptoms of physical distress Progress to 30 minutes of  aerobic without signs/symptoms of physical distress   Intensity -- THRR unchanged THRR unchanged THRR unchanged THRR unchanged     Progression   Progression --  Continue to progress workloads to maintain intensity without signs/symptoms of physical distress. Continue to progress workloads to maintain intensity without signs/symptoms of physical distress. Continue to progress workloads to maintain intensity without signs/symptoms of physical distress. Continue to progress workloads to maintain intensity without signs/symptoms of physical distress.   Average METs -- 2 1.3 2.49 2.17     Resistance Training   Training Prescription -- Yes Yes Yes Yes   Weight -- 3 lb  3 lb  3 lb 4 lb   Reps -- 10-15 10-15 10-15 10-15     Interval Training   Interval Training -- No No No No     Treadmill   MPH -- 1.3 -- 1.4 1.4   Grade -- 0 -- 0.5 0.5   Minutes -- 15 -- 15 15   METs -- 2 -- 2.17 2.17     REL-XR   Level -- '1 1 1 1   ' Speed -- -- 50 -- 50   Minutes -- '15 15 15 15   ' METs -- 2 1.3 2.2 --     T5 Nustep   Level -- -- -- 2 --   Minutes -- -- -- 15 --   METs -- -- -- 3.1 --   Row Name 08/04/19 0800 08/18/19 1300 09/14/19 1400 09/27/19 1400       Response to Exercise   Blood Pressure (Admit) 122/80 132/66 132/74 126/80    Blood Pressure (Exercise) 140/64 146/70 136/70 148/80    Blood Pressure (Exit) 122/72 140/76 124/66 116/74    Heart Rate (Admit) 94 bpm 111 bpm 98 bpm 84 bpm    Heart Rate (Exercise) 114 bpm 119 bpm 118 bpm  109 bpm    Heart Rate (Exit) 99 bpm 95 bpm 106 bpm 95 bpm    Rating of Perceived Exertion (Exercise) '13 15 12 11    ' Symptoms none none none none    Duration Continue with 30 min of aerobic exercise without signs/symptoms of physical distress. Continue with 30 min of aerobic exercise without signs/symptoms of physical distress. Progress to 30 minutes of  aerobic without signs/symptoms of physical distress Continue with 30 min of aerobic exercise without signs/symptoms of physical distress.    Intensity THRR unchanged THRR unchanged THRR unchanged THRR unchanged      Progression   Progression Continue to progress workloads to maintain intensity without signs/symptoms of physical distress. Continue to progress workloads to maintain intensity without signs/symptoms of physical distress. Continue to progress workloads to maintain intensity without signs/symptoms of physical distress. Continue to progress workloads to maintain intensity without signs/symptoms of physical distress.    Average METs 2.1 2.3 2.53 2.6      Resistance Training   Training Prescription Yes Yes Yes Yes    Weight 4 lb 4 lb 4 lb 3 lb    Reps 10-15 10-15 10-15 10-15      Interval Training   Interval Training No No No No      Treadmill   MPH -- 1.6 1.7 --    Grade -- 0.5 0.5 --    Minutes -- 15 15 --    METs -- 2.17 2.42 --      Recumbant Bike   Level -- -- 1 3    Watts -- -- 25 20    Minutes -- -- 15 15    METs -- -- -- 2.9      NuStep   Level '3 3 1 2    ' SPM -- 80 -- --  Minutes '30 15 15 15    ' METs 2.1 2.4 2.8 2.5      T5 Nustep   Level -- -- 3 --    Minutes -- -- 15 --    METs -- -- 2 --           Exercise Comments:  Exercise Comments    Row Name 06/02/19 1353           Exercise Comments First full day of exercise!  Patient was oriented to gym and equipment including functions, settings, policies, and procedures.  Patient's individual exercise prescription and treatment plan were reviewed.  All  starting workloads were established based on the results of the 6 minute walk test done at initial orientation visit.  The plan for exercise progression was also introduced and progression will be customized based on patient's performance and goals.              Exercise Goals and Review:  Exercise Goals    Row Name 05/20/19 1544             Exercise Goals   Increase Physical Activity Yes       Intervention Provide advice, education, support and counseling about physical activity/exercise needs.;Develop an individualized exercise prescription for aerobic and resistive training based on initial evaluation findings, risk stratification, comorbidities and participant's personal goals.       Expected Outcomes Short Term: Attend rehab on a regular basis to increase amount of physical activity.;Long Term: Add in home exercise to make exercise part of routine and to increase amount of physical activity.;Long Term: Exercising regularly at least 3-5 days a week.       Increase Strength and Stamina Yes       Intervention Provide advice, education, support and counseling about physical activity/exercise needs.;Develop an individualized exercise prescription for aerobic and resistive training based on initial evaluation findings, risk stratification, comorbidities and participant's personal goals.       Expected Outcomes Short Term: Increase workloads from initial exercise prescription for resistance, speed, and METs.;Short Term: Perform resistance training exercises routinely during rehab and add in resistance training at home;Long Term: Improve cardiorespiratory fitness, muscular endurance and strength as measured by increased METs and functional capacity (6MWT)       Able to understand and use rate of perceived exertion (RPE) scale Yes       Intervention Provide education and explanation on how to use RPE scale       Expected Outcomes Short Term: Able to use RPE daily in rehab to express subjective  intensity level;Long Term:  Able to use RPE to guide intensity level when exercising independently       Able to understand and use Dyspnea scale Yes       Intervention Provide education and explanation on how to use Dyspnea scale       Expected Outcomes Short Term: Able to use Dyspnea scale daily in rehab to express subjective sense of shortness of breath during exertion;Long Term: Able to use Dyspnea scale to guide intensity level when exercising independently       Knowledge and understanding of Target Heart Rate Range (THRR) Yes       Intervention Provide education and explanation of THRR including how the numbers were predicted and where they are located for reference       Expected Outcomes Short Term: Able to state/look up THRR;Short Term: Able to use daily as guideline for intensity in rehab;Long Term: Able to use THRR  to govern intensity when exercising independently       Able to check pulse independently Yes       Intervention Provide education and demonstration on how to check pulse in carotid and radial arteries.;Review the importance of being able to check your own pulse for safety during independent exercise       Expected Outcomes Short Term: Able to explain why pulse checking is important during independent exercise;Long Term: Able to check pulse independently and accurately       Understanding of Exercise Prescription Yes       Intervention Provide education, explanation, and written materials on patient's individual exercise prescription       Expected Outcomes Short Term: Able to explain program exercise prescription;Long Term: Able to explain home exercise prescription to exercise independently              Exercise Goals Re-Evaluation :  Exercise Goals Re-Evaluation    Row Name 06/02/19 1353 06/09/19 1109 06/22/19 1553 06/30/19 1343 07/07/19 1131     Exercise Goal Re-Evaluation   Exercise Goals Review Able to understand and use rate of perceived exertion (RPE)  scale;Understanding of Exercise Prescription;Knowledge and understanding of Target Heart Rate Range (THRR) Increase Physical Activity;Increase Strength and Stamina;Understanding of Exercise Prescription Increase Physical Activity;Increase Strength and Stamina;Able to understand and use rate of perceived exertion (RPE) scale;Able to understand and use Dyspnea scale;Knowledge and understanding of Target Heart Rate Range (THRR);Able to check pulse independently;Understanding of Exercise Prescription Increase Physical Activity;Increase Strength and Stamina;Able to understand and use rate of perceived exertion (RPE) scale;Able to understand and use Dyspnea scale;Knowledge and understanding of Target Heart Rate Range (THRR);Able to check pulse independently;Understanding of Exercise Prescription Increase Physical Activity;Increase Strength and Stamina;Understanding of Exercise Prescription   Comments Reviewed RPE scale, THR and program prescription with pt today.  Pt voiced understanding and was given a copy of goals to take home. Del is off to a good start in rehab.  He has completed his first two full days of exericse.  [name] now up to 1.3 mph on the treadmill.  We will continue to monitor his progress. Mathius only did the XR today.  He did not feel up to the TM.  Staff will monitor progress Olen is now on the treadmill again. Kray reports walking at home for exercise; 1 mile 3x/week (RPE12). Amarrion is doing well in rehab.  [name] now up to 3.1 METs on the NuStep.  We will continue to monitor his progress.   Expected Outcomes Short: Use RPE daily to regulate intensity. Long: Follow program prescription in THR. Short: Continue to attend rehab regularly  Long: Continue to follow program prescription. Short: attend consistently Long: improve overall stamina Short: attend consistently, add some resistence at home Long: improve overall stamina Short: Continue to improve walking on treadmill  Long; Continue to  improve stamina.   [place] Name 07/20/19 1536 08/02/19 1542 08/18/19 1316 09/14/19 1404 09/27/19 1411     Exercise Goal Re-Evaluation   Exercise Goals Review Increase Physical Activity;Increase Strength and Stamina;Able to understand and use rate of perceived exertion (RPE) scale;Able to understand and use Dyspnea scale;Knowledge and understanding of Target Heart Rate Range (THRR);Able to check pulse independently;Understanding of Exercise Prescription Increase Physical Activity;Increase Strength and Stamina;Understanding of Exercise Prescription Increase Physical Activity;Increase Strength and Stamina;Understanding of Exercise Prescription Increase Physical Activity;Increase Strength and Stamina;Understanding of Exercise Prescription Increase Physical Activity;Increase Strength and Stamina;Understanding of Exercise Prescription   Comments Rondey works in [employer] range  when he attends.  he has increased to 4 lb weights for strength work Gwyndolyn Saxon returned to rehab today after missing since 4/28.  He had been out after debriment of a diabetic ulcer.  He did well today on the equipment and particiapated in weights as well.  We will continue to monitor his progress. Ethelene Browns has progressed well since returning.  He has increased speed to 1.6 on TM. Staff will monitor progress. Derrall is doing well in rehab. [name] continuing to improve his exercise levels gradually. [name] at 1.7 on the TM. Will continue to monitor. Only attended once since last review.  He had a cath last week, waiting for clearance to return (note sent to MD).  He would do better with improved attendance.  We will continue to monitor his progress.  Also saw in chart that he wishes to discontinue his home oxygen therapy.   Expected Outcomes Short: continue to attend consistently Long:  improve stamina and MET level Short: Return to improved attendance Long: Continue to improve stamina. Short: attend class consistently Long: increase stamina Short: Maintain  compliance with rehab classes Long: Improve MET level and strength and endurance Short: Regularly attendance in rehab  Long: Continue to improve stamina.          Discharge Exercise Prescription (Final Exercise Prescription Changes):  Exercise Prescription Changes - 09/27/19 1400      Response to Exercise   Blood Pressure (Admit) 126/80    Blood Pressure (Exercise) 148/80    Blood Pressure (Exit) 116/74    Heart Rate (Admit) 84 bpm    Heart Rate (Exercise) 109 bpm    Heart Rate (Exit) 95 bpm    Rating of Perceived Exertion (Exercise) 11    Symptoms none    Duration Continue with 30 min of aerobic exercise without signs/symptoms of physical distress.    Intensity THRR unchanged      Progression   Progression Continue to progress workloads to maintain intensity without signs/symptoms of physical distress.    Average METs 2.6      Resistance Training   Training Prescription Yes    Weight 3 lb    Reps 10-15      Interval Training   Interval Training No      Recumbant Bike   Level 3    Watts 20    Minutes 15    METs 2.9      NuStep   Level 2    Minutes 15    METs 2.5           Nutrition:  Target Goals: Understanding of nutrition guidelines, daily intake of sodium <1569m, cholesterol <2076m calories 30% from fat and 7% or less from saturated fats, daily to have 5 or more servings of fruits and vegetables.  Education: Controlling Sodium/Reading Food Labels -Group verbal and written material supporting the discussion of sodium use in heart healthy nutrition. Review and explanation with models, verbal and written materials for utilization of the food label.   Education: General Nutrition Guidelines/Fats and Fiber: -Group instruction provided by verbal, written material, models and posters to present the general guidelines for heart healthy nutrition. Gives an explanation and review of dietary fats and fiber.   Biometrics:  Pre Biometrics - 05/20/19 1545      Pre  Biometrics   Height '5\' 7"'  (1.702 m)    Weight 206 lb (93.4 kg)    BMI (Calculated) 32.26  Nutrition Therapy Plan and Nutrition Goals:  Nutrition Therapy & Goals - 05/24/19 0904      Nutrition Therapy   Diet HH, Low Na, DM    Drug/Food Interactions Coumadin/Vit K    Protein (specify units) 80-85g    Fiber 30 grams    Whole Grain Foods 3 servings    Saturated Fats 12 max. grams    Fruits and Vegetables 5 servings/day    Sodium 1.5 grams      Personal Nutrition Goals   Nutrition Goal ST: have one protein food at each meal. Small frequent meals. LT: improving SOB and DOE. 4/10    Comments Pt reports needing help. SOB with eating. Not much appetite. eggs and grits in am. turnip greens and green beans. Canned peaches. Discussed protein sources with wife and pt. Discussed how right now the focus is on protein and calorie needs as pt is not eating due to SOB, but discussed some HH and DM options as well. Wife goignt o try BBG Chicken. Pt reports not eating many foods high in protein. Fried chicken legs last night with some peaches, BG this morning was 188, usually around 130-150. BG stable throught the day. Discussed HH, DM friendly eating. Discussed protein and calorie needs and how to meet them with SOB such as small frequent meals. Hard for pt to hear, will talk more during class time in person.      Intervention Plan   Intervention Prescribe, educate and counsel regarding individualized specific dietary modifications aiming towards targeted core components such as weight, hypertension, lipid management, diabetes, heart failure and other comorbidities.;Nutrition handout(s) given to patient.    Expected Outcomes Short Term Goal: Understand basic principles of dietary content, such as calories, fat, sodium, cholesterol and nutrients.;Short Term Goal: A plan has been developed with personal nutrition goals set during dietitian appointment.;Long Term Goal: Adherence to prescribed  nutrition plan.           Nutrition Assessments:  Nutrition Assessments - 05/20/19 1652      MEDFICTS Scores   Pre Score 46           MEDIFICTS Score Key:          ?70 Need to make dietary changes          40-70 Heart Healthy Diet         ? 40 Therapeutic Level Cholesterol Diet  Nutrition Goals Re-Evaluation:  Nutrition Goals Re-Evaluation    [place] Name 06/10/19 1348 08/18/19 1409 09/09/19 1400         Goals   Current Weight -- -- 204 lb (92.5 kg)     Nutrition Goal ST: have one protein food at each meal. Small frequent meals. LT: improving SOB and DOE. 4/10 -- Have more of an appetite     Comment Pt's wife [name] will make all of his meals for him. Wife reports making a protein  food at each meal, mostly chicken. clarified what foods are good sources of protein, wife voiced understanding. Reiterated importance of getting enough protein intake to pt and small frequent meals for his SOB. Pt reports still having SOB. Pt and wife did not recall initial RD consult, will continue to follow up. Laiden has been having more slamon and fish for protein. He states that when he is hungry that he is eating hot dogs or a hamburger. Informed him that he needs to work on his diet and focus on eating healthier foods that can help with his appetite.  Expected Outcome ST: have one protein food at each meal. Small frequent meals. LT: improving SOB and DOE. 4/10 Short: continue having quality lean protein Long:  maintain heart healthy eating Short: eat more  heart healthy foods and watch sodium intake. Long: maintain a healthy amount of sodium intake independently            Nutrition Goals Discharge (Final Nutrition Goals Re-Evaluation):  Nutrition Goals Re-Evaluation - 09/09/19 1400      Goals   Current Weight 204 lb (92.5 kg)    Nutrition Goal Have more of an appetite    Comment He states that when he is hungry that he is eating hot dogs or a hamburger. Informed him that he needs to work on  his diet and focus on eating healthier foods that can help with his appetite.    Expected Outcome Short: eat more  heart healthy foods and watch sodium intake. Long: maintain a healthy amount of sodium intake independently           Psychosocial: Target Goals: Acknowledge presence or absence of significant depression and/or stress, maximize coping skills, provide positive support system. Participant is able to verbalize types and ability to use techniques and skills needed for reducing stress and depression.   Education: Depression - Provides group verbal and written instruction on the correlation between heart/lung disease and depressed mood, treatment options, and the stigmas associated with seeking treatment.   Education: Sleep Hygiene -Provides group verbal and written instruction about how sleep can affect your health.  Define sleep hygiene, discuss sleep cycles and impact of sleep habits. Review good sleep hygiene tips.     Education: Stress and Anxiety: - Provides group verbal and written instruction about the health risks of elevated stress and causes of high stress.  Discuss the correlation between heart/lung disease and anxiety and treatment options. Review healthy ways to manage with stress and anxiety.    Initial Review & Psychosocial Screening:  Initial Psych Review & Screening - 05/19/19 0914      Initial Review   Current issues with Current Stress Concerns    Source of Stress Concerns Chronic Illness;Unable to participate in former interests or hobbies;Unable to perform yard/household activities    Comments Wants more energy to do more, less SOB, wearing oxygen and using cane      Family Dynamics   Good Support System? Yes   wife, son, daughter     Barriers   Psychosocial barriers to participate in program Psychosocial barriers identified (see note);The patient should benefit from training in stress management and relaxation.      Screening Interventions    Interventions Encouraged to exercise;To provide support and resources with identified psychosocial needs;Provide feedback about the scores to participant    Expected Outcomes Short Term goal: Utilizing psychosocial counselor, staff and physician to assist with identification of specific Stressors or current issues interfering with healing process. Setting desired goal for each stressor or current issue identified.;Long Term Goal: Stressors or current issues are controlled or eliminated.;Short Term goal: Identification and review with participant of any Quality of Life or Depression concerns found by scoring the questionnaire.;Long Term goal: The participant improves quality of Life and PHQ9 Scores as seen by post scores and/or verbalization of changes           Quality of Life Scores:   Quality of Life - 05/20/19 1545      Quality of Life   Select Quality of Life  Quality of Life Scores   Health/Function Pre 20.14 %    Socioeconomic Pre 30 %    Psych/Spiritual Pre 30 %    Family Pre 30 %    GLOBAL Pre 25.94 %          Scores of 19 and below usually indicate a poorer quality of life in these areas.  A difference of  2-3 points is a clinically meaningful difference.  A difference of 2-3 points in the total score of the Quality of Life Index has been associated with significant improvement in overall quality of life, self-image, physical symptoms, and general health in studies assessing change in quality of life.  PHQ-9: Recent Review Flowsheet Data    Depression screen [facility] 2/9 09/09/2019 09/08/2019 09/08/2019 05/20/2019 02/02/2019   Decreased Interest 0 '1 1 1 ' 0   Down, Depressed, Hopeless '1 1 1 2 ' 0   PHQ - 2 Score '1 2 2 3 ' 0   Altered sleeping '1 3 3 2 ' 0   Tired, decreased energy 1 0 0 3 0   Change in appetite 2 0 0 2 0   Feeling bad or failure about yourself  0 '3 3 2 ' 0   Trouble concentrating 0 0 0 2 0   Moving slowly or fidgety/restless 0 - 3 2 0   Suicidal thoughts 0 0 0 0 0    PHQ-9 Score '5 8 11 16 ' 0   Difficult doing work/chores Somewhat difficult Not difficult at all Not difficult at all Somewhat difficult -     Interpretation of Total Score  Total Score Depression Severity:  1-4 = Minimal depression, 5-9 = Mild depression, 10-14 = Moderate depression, 15-19 = Moderately severe depression, 20-27 = Severe depression   Psychosocial Evaluation and Intervention:  Psychosocial Evaluation - 06/30/19 1345      Psychosocial Evaluation & Interventions   Interventions Encouraged to exercise with the program and follow exercise prescription;Stress management education    Comments Delmont reports he is less stressed now that he has been coming to class and exercising at home. ADLs are still hard, but they are manageable; they have improved since starting rehab.    Expected Outcomes Short: Attend rehab regularly to build stamina and improve breating. Long:Improve stamina.    Continue Psychosocial Services  Follow up required by staff           Psychosocial Re-Evaluation:  Psychosocial Re-Evaluation    [place] Name 08/18/19 1408 09/09/19 1357           Psychosocial Re-Evaluation   Current issues with Current Stress Concerns History of Depression;Current Psychotropic Meds;Current Sleep Concerns      Comments Jermon says he sleeps pretty well and has no stress concerns Reviewed patient health questionnaire (PHQ-9) with patient for follow up. Previously, patients score indicated signs/symptoms of depression.  Reviewed to see if patient is improving symptom wise while in program.  Score improved and patient states that it is because he has been able to workout more and have energy.      Expected Outcomes Short: continue to exercise Long: maintai positive outlook Short: Continue to attend [facility] regularly for regular exercise and social engagement. Long: Continue to improve symptoms and manage a positive mental state.      Interventions -- Encouraged to attend Cardiac  Rehabilitation for the exercise      Continue Psychosocial Services  -- Follow up required by staff             Psychosocial Discharge (  Final Psychosocial Re-Evaluation):  Psychosocial Re-Evaluation - 09/09/19 1357      Psychosocial Re-Evaluation   Current issues with History of Depression;Current Psychotropic Meds;Current Sleep Concerns    Comments Reviewed patient health questionnaire (PHQ-9) with patient for follow up. Previously, patients score indicated signs/symptoms of depression.  Reviewed to see if patient is improving symptom wise while in program.  Score improved and patient states that it is because he has been able to workout more and have energy.    Expected Outcomes Short: Continue to attend [facility] regularly for regular exercise and social engagement. Long: Continue to improve symptoms and manage a positive mental state.    Interventions Encouraged to attend Cardiac Rehabilitation for the exercise    Continue Psychosocial Services  Follow up required by staff           Vocational Rehabilitation: Provide vocational rehab assistance to qualifying candidates.   Vocational Rehab Evaluation & Intervention:  Vocational Rehab - 05/19/19 0913      Initial Vocational Rehab Evaluation & Intervention   Assessment shows need for Vocational Rehabilitation No   retired          Education: Education Goals: Education classes will be provided on a variety of topics geared toward better understanding of heart health and risk factor modification. Participant will state understanding/return demonstration of topics presented as noted by education test scores.  Learning Barriers/Preferences:  Learning Barriers/Preferences - 05/19/19 0914      Learning Barriers/Preferences   Learning Barriers Sight;Hearing   wears glasses, HOH   Learning Preferences None           General Cardiac Education Topics:  AED/CPR: - Group verbal and written instruction with the use of models  to demonstrate the basic use of the AED with the basic ABC's of resuscitation.   Anatomy & Physiology of the Heart: - Group verbal and written instruction and models provide basic cardiac anatomy and physiology, with the coronary electrical and arterial systems. Review of Valvular disease and Heart Failure   Cardiac Procedures: - Group verbal and written instruction to review commonly prescribed medications for heart disease. Reviews the medication, class of the drug, and side effects. Includes the steps to properly store meds and maintain the prescription regimen. (beta blockers and nitrates)   Cardiac Medications I: - Group verbal and written instruction to review commonly prescribed medications for heart disease. Reviews the medication, class of the drug, and side effects. Includes the steps to properly store meds and maintain the prescription regimen.   Cardiac Medications II: -Group verbal and written instruction to review commonly prescribed medications for heart disease. Reviews the medication, class of the drug, and side effects. (all other drug classes)    Go Sex-Intimacy & Heart Disease, Get SMART - Goal Setting: - Group verbal and written instruction through game format to discuss heart disease and the return to sexual intimacy. Provides group verbal and written material to discuss and apply goal setting through the application of the S.M.A.R.T. Method.   Other Matters of the Heart: - Provides group verbal, written materials and models to describe Stable Angina and Peripheral Artery. Includes description of the disease process and treatment options available to the cardiac patient.   Infection Prevention: - Provides verbal and written material to individual with discussion of infection control including proper hand washing and proper equipment cleaning during exercise session.   Cardiac Rehab from 05/20/2019 in [facility] Cardiac and Pulmonary Rehab  Date 05/20/19  Educator AS   Instruction Review Code  1- Verbalizes Understanding      Falls Prevention: - Provides verbal and written material to individual with discussion of falls prevention and safety.   Cardiac Rehab from 05/20/2019 in [facility] Cardiac and Pulmonary Rehab  Date 05/20/19  Educator AS  Instruction Review Code 1- Verbalizes Understanding      Other: -Provides group and verbal instruction on various topics (see comments)   Knowledge Questionnaire Score:  Knowledge Questionnaire Score - 05/20/19 1546      Knowledge Questionnaire Score   Pre Score 22/26           Core Components/Risk Factors/Patient Goals at Admission:  Personal Goals and Risk Factors at Admission - 05/20/19 1547      Core Components/Risk Factors/Patient Goals on Admission    Weight Management Yes;Weight Loss;Obesity    Intervention Weight Management: Develop a combined nutrition and exercise program designed to reach desired caloric intake, while maintaining appropriate intake of nutrient and fiber, sodium and fats, and appropriate energy expenditure required for the weight goal.;Weight Management: Provide education and appropriate resources to help participant work on and attain dietary goals.;Weight Management/Obesity: Establish reasonable short term and long term weight goals.;Obesity: Provide education and appropriate resources to help participant work on and attain dietary goals.    Admit Weight 206 lb (93.4 kg)    Goal Weight: Short Term 196 lb (88.9 kg)    Goal Weight: Long Term 186 lb (84.4 kg)    Expected Outcomes Short Term: Continue to assess and modify interventions until short term weight is achieved;Long Term: Adherence to nutrition and physical activity/exercise program aimed toward attainment of established weight goal;Weight Loss: Understanding of general recommendations for a balanced deficit meal plan, which promotes 1-2 lb weight loss per week and includes a negative energy balance of [phone]  kcal/d;Understanding recommendations for meals to include 15-35% energy as protein, 25-35% energy from fat, 35-60% energy from carbohydrates, less than 274m of dietary cholesterol, 20-35 gm of total fiber daily;Understanding of distribution of calorie intake throughout the day with the consumption of 4-5 meals/snacks           Education:Diabetes - Individual verbal and written instruction to review signs/symptoms of diabetes, desired ranges of glucose level fasting, after meals and with exercise. Acknowledge that pre and post exercise glucose checks will be done for 3 sessions at entry of program.   Education: Know Your Numbers and Risk Factors: -Group verbal and written instruction about important numbers in your health.  Discussion of what are risk factors and how they play a role in the disease process.  Review of Cholesterol, Blood Pressure, Diabetes, and BMI and the role they play in your overall health.   Core Components/Risk Factors/Patient Goals Review:   Goals and Risk Factor Review    [place] Name 06/30/19 1347 08/18/19 1407 09/09/19 1359         Core Components/Risk Factors/Patient Goals Review   Personal Goals Review Weight Management/Obesity;Heart Failure;Improve shortness of breath with ADL's;Stress;Hypertension Weight Management/Obesity;Heart Failure;Improve shortness of breath with ADL's;Stress;Hypertension Weight Management/Obesity;Improve shortness of breath with ADL's;Diabetes     Review WTrygveis taking his medications as directed. [name] managing his stress with exercise and his support system. He reports improving with ADLs. WMirandacontinues to take meds as directed.  [name] walking some outside of class.  He says he is more limber and feels better since starting HT. Spoke to patient about their shortness of breath and what they can do to improve. Patient has been informed of breathing  techniques when starting the program. Patient is informed to tell staff if they have had  any med changes and that certain meds they are taking or not taking can be causing shortness of breath.     Expected Outcomes ST: continue taking medications as directed, continue managing stress levels, continue with exercise and nutrition regimen Short:  continue with current program  Long: manage risk factors and HF Short: Attend [facility] regularly to improve shortness of breath with ADL's. Long: maintain independence with ADL's            Core Components/Risk Factors/Patient Goals at Discharge (Final Review):   Goals and Risk Factor Review - 09/09/19 1359      Core Components/Risk Factors/Patient Goals Review   Personal Goals Review Weight Management/Obesity;Improve shortness of breath with ADL's;Diabetes    Review Spoke to patient about their shortness of breath and what they can do to improve. Patient has been informed of breathing techniques when starting the program. Patient is informed to tell staff if they have had any med changes and that certain meds they are taking or not taking can be causing shortness of breath.    Expected Outcomes Short: Attend [facility] regularly to improve shortness of breath with ADL's. Long: maintain independence with ADL's           ITP Comments:  ITP Comments    [place] Name 05/19/19 0936 05/24/19 0936 06/02/19 1353 06/09/19 0602 07/07/19 0528   ITP Comments Completed virtual orientation today.  EP evaluation is scheduled for Thursday 3/4 at 2pm.  Documentation for diagnosis can be found in [facility] encounter 05/14/19.  Pt had oriented on 02/26/19 but never came in for his walk test for various reasons.  Now he is coming in for Cardiac Rehab. Completed Initial RD Eval First full day of exercise!  Patient was oriented to gym and equipment including functions, settings, policies, and procedures.  Patient's individual exercise prescription and treatment plan were reviewed.  All starting workloads were established based on the results of the 6 minute walk test done  at initial orientation visit.  The plan for exercise progression was also introduced and progression will be customized based on patient's performance and goals. 30 day chart review completed. ITP sent to Dr Zachery Dakins Medical Director, for review,changes as needed and signature. Continue with ITP if no changes requested  New to program 30 Day review completed. Medical Director review done, changes made as directed,and approval shown by signature of Market researcher.   [place] Name 08/04/19 0541 09/01/19 0543 09/27/19 1410 09/29/19 0610     ITP Comments 30 Day review completed. ITP review done, changes made as directed,and approval shown by signature of  Scientist, research (life sciences). 30 Day review completed. Medical Director ITP review done, changes made as directed, and signed approval by Medical Director. Out since 6/30.  Had right heart cath on 7/8. 30 Day review completed. Medical Director ITP review done, changes made as directed, and signed approval by Medical Director.           Comments:

## 2019-09-29 NOTE — Patient Instructions (Signed)
-   take 1.5 tablets warfarin today & tomorrow - continue dosage warfarin of 1 TABLET EVERY DAY EXCEPT 1.5 TABLETS ON MONDAYS & FRIDAYS. - recheck INR in 2 weeks.

## 2019-09-30 ENCOUNTER — Telehealth: Payer: Self-pay | Admitting: Internal Medicine

## 2019-09-30 NOTE — Telephone Encounter (Signed)
Spoke with [place]. She is concerned because the patient's CardioMems transfer device is not working. She has a new one being sent to her. It was last checked on Monday and she was told his fluid was fine. She also said Mr. Cogswell reported he did not urinate as much over the past 24 hours.  Patient took Torsemide 80 mg this morning to help.  Patient is not home at this time. Per wife, his weight is stable at 198 lb this morning. Denies any swelling. [name] still "shortwinded." Advised her that his weight is stable and no swelling is a good sign. I think she is concerned about his fluid because she cannot use the device right now to see what it is. I suggested he could come in for an office visit to further discuss. She is agreeable to come in tomorrow to see [name], NP.

## 2019-09-30 NOTE — Telephone Encounter (Signed)
Patient wife would like to discuss patient's fluid. States the machine that measured his fluid has stopped working and they are awaiting another one. States she is unable to know how to adjust his fluid medication. Please call to discuss.

## 2019-10-01 ENCOUNTER — Other Ambulatory Visit: Payer: Self-pay

## 2019-10-01 ENCOUNTER — Encounter: Payer: Self-pay | Admitting: Nurse Practitioner

## 2019-10-01 ENCOUNTER — Ambulatory Visit: Payer: Medicare HMO | Admitting: Family Medicine

## 2019-10-01 ENCOUNTER — Ambulatory Visit (INDEPENDENT_AMBULATORY_CARE_PROVIDER_SITE_OTHER): Payer: Medicare HMO | Admitting: Nurse Practitioner

## 2019-10-01 VITALS — BP 130/62 | HR 87 | Ht 64.0 in | Wt 201.1 lb

## 2019-10-01 DIAGNOSIS — I480 Paroxysmal atrial fibrillation: Secondary | ICD-10-CM | POA: Diagnosis not present

## 2019-10-01 DIAGNOSIS — I255 Ischemic cardiomyopathy: Secondary | ICD-10-CM

## 2019-10-01 DIAGNOSIS — I5020 Unspecified systolic (congestive) heart failure: Secondary | ICD-10-CM

## 2019-10-01 DIAGNOSIS — N184 Chronic kidney disease, stage 4 (severe): Secondary | ICD-10-CM | POA: Diagnosis not present

## 2019-10-01 DIAGNOSIS — I10 Essential (primary) hypertension: Secondary | ICD-10-CM

## 2019-10-01 DIAGNOSIS — E785 Hyperlipidemia, unspecified: Secondary | ICD-10-CM | POA: Diagnosis not present

## 2019-10-01 DIAGNOSIS — I2510 Atherosclerotic heart disease of native coronary artery without angina pectoris: Secondary | ICD-10-CM | POA: Diagnosis not present

## 2019-10-01 MED ORDER — HYDRALAZINE 25 MG TABLET
ORAL | 0 days
Start: 2019-10-01 — End: ?

## 2019-10-01 MED ORDER — NITROGLYCERIN 0.4 MG SL SUBL: 0.4000 mg | SUBLINGUAL_TABLET | SUBLINGUAL | 4 refills | Status: DC | PRN

## 2019-10-01 MED ORDER — HYDRALAZINE HCL 25 MG PO TABS: 25.0000 mg | ORAL_TABLET | Freq: Three times a day (TID) | ORAL | 3 refills | Status: DC

## 2019-10-01 NOTE — Patient Instructions (Signed)
Medication Instructions: Your physician recommends that you continue on your current medications as directed. Please refer to the Current Medication list given to you today.  *If you need a refill on your cardiac medications before your next appointment, please call your pharmacy*   Lab Work:none ordered If you have labs (blood work) drawn today and your tests are completely normal, you will receive your results only by: [name] MyChart Message (if you have MyChart) OR . A paper copy in the mail If you have any lab test that is abnormal or we need to change your treatment, we will call you to review the results.   Testing/Procedures:none ordered  Follow-Up: At [facility], you and your health needs are our priority.  As part of our continuing mission to provide you with exceptional heart care, we have created designated Provider Care Teams.  These Care Teams include your primary Cardiologist (physician) and Advanced Practice Providers (APPs -  Physician Assistants and Nurse Practitioners) who all work together to provide you with the care you need, when you need it.  We recommend signing up for the patient portal called "MyChart".  Sign up information is provided on this After Visit Summary.  MyChart is used to connect with patients for Virtual Visits (Telemedicine).  Patients are able to view lab/test results, encounter notes, upcoming appointments, etc.  Non-urgent messages can be sent to your provider as well.   To learn more about what you can do with MyChart, go to [email or web address].    Your next appointment:   3 month(s)  The format for your next appointment:   In Person  Provider:    You may see [name], MD or [name], NP

## 2019-10-01 NOTE — Progress Notes (Signed)
Office Visit    Patient Name: Lucas Caldwell Date of Encounter: 10/01/2019  Primary Care Provider:  [name], DO Primary Cardiologist:  [name], MD  Chief Complaint    86 year old male with a history of CAD status post prior RCA, circumflex and most recently LAD stenting, ischemic cardiomyopathy, chronic combined systolic and diastolic congestive heart failure, hypertension, hyperlipidemia, type 2 diabetes mellitus, paroxysmal atrial fibrillation (on warfarin), stage III-IV chronic kidney disease, CML, iron deficiency anemia, GI bleed due to duodenal ulcers (October 2019), BPH, and hypothyroidism who presents for follow-up of heart failure status post CardioMEMS placement.  Past Medical History    Past Medical History:  Diagnosis Date  . Arthritis    "probably in his legs" (08/15/2016)  . Blind right eye   . BPH (benign prostatic hyperplasia)   . Breast asymmetry    Left breast is larger, present for several years.  [name] CAD (coronary artery disease)    a. Cath in the late 90's - reportedly ok;  b. 2014 s/p stenting x 2 @ [facility]; c 08/19/16 Cath/PCI with DES -> RCA, plan to treat LM 70% medically. Seen by surgery and felt to be too high risk for CABG; d. 08/2018 Cath: LM 70 (iFR 0.86), LAD 20ost, 40p, 50/22m D1 70, LCX patent stent, OM1 30, RCA patent stent, 426mSR, 40d, RPDA 30. PCWP 8. CO/CI 4.0/2.1.  . Marland Kitchenhronic combined systolic (congestive) and diastolic (congestive) heart failure (HCM[facility]   a. Previously reduced EF-->50% by echo in 2012;  b. 06/2015 Echo: EF 50-55%  c. 07/2016 Echo: EF 45-50%; d. 12/2017 Echo: EF 55%, Gr1 DD, mild MR; e. 08/2018 Echo: EF 35-40%, mildly dil PA.  . Marland Kitchenhronic kidney disease (CKD), stage IV (severe) (HCCl[place]  . CML (chronic myelocytic leukemia) (HCA[place]  . GERD (gastroesophageal reflux disease)   . GIB (gastrointestinal bleeding)    a. 12/2017 3 unit PRBC GIB in setting of coumadin-->Endo/colon multiple duodenal ulcers and a single bleeding  ulcer in the proximal ascending colon status post hemostatic clipping x2.  . Gout   . Hyperlipemia   . Hypertension   . Hypothyroidism   . Iron deficiency anemia   . Ischemic cardiomyopathy    a. Previously reduced EF-->50% by echo in 2012;  b. 06/2015 Echo: EF 50-55%, Gr2 DD, mild MR, mildly dil LA, Ao sclerosis, mild TR;  c. 07/2016 Echo: EF 45-50%; d. 12/2017 Echo: EF 55%, Gr1 DD, mild MR; e. 08/2018 Echo: EF 35-40%.  . Migraine    "in the 1960s" (08/15/2016)  . PAF (paroxysmal atrial fibrillation) (HCC)    a. ? Dx 2014-->s/p DCCV;  b. CHA2DS2VASc = 6--> Coumadin.  . Prostate cancer (HCAmasa  . RBBB   . Type II diabetes mellitus (HCS[place]   Past Surgical History:  Procedure Laterality Date  . CATARACT EXTRACTION W/ INTRAOCULAR LENS  IMPLANT, BILATERAL Bilateral   . COLONOSCOPY N/A 01/13/2018   Procedure: COLONOSCOPY;  Surgeon: VaLin LandsmanMD;  [facility];  Service: Gastroenterology;  Laterality: N/A;  . COLONOSCOPY WITH PROPOFOL N/A 01/01/2018   Procedure: COLONOSCOPY WITH PROPOFOL;  Surgeon: VaLin LandsmanMD;  [facility]  Service: Gastroenterology;  Laterality: N/A;  . CORONARY ANGIOPLASTY WITH STENT PLACEMENT  09/2012   2 stents  . CORONARY STENT INTERVENTION N/A 08/19/2016   Procedure: Coronary Stent Intervention;  Surgeon: EnNelva BushMD;  [facility];  Service: Cardiovascular;  Laterality: N/A;  . CORONARY STENT  INTERVENTION N/A 04/13/2019   Procedure: CORONARY STENT INTERVENTION;  Surgeon: [name], MD;  Location: [place] CV LAB;  Service: Cardiovascular;  Laterality: N/A;  . ESOPHAGEAL MANOMETRY N/A 12/16/2017   Procedure: ESOPHAGEAL MANOMETRY (EM);  Surgeon: Lin Landsman, MD;  Location: [facility] ENDOSCOPY;  Service: Gastroenterology;  Laterality: N/A;  . ESOPHAGOGASTRODUODENOSCOPY N/A 12/20/2016   Procedure: ESOPHAGOGASTRODUODENOSCOPY (EGD);  Surgeon: Lin Landsman, MD;  [facility];   Service: Gastroenterology;  Laterality: N/A;  . ESOPHAGOGASTRODUODENOSCOPY N/A 01/13/2018   Procedure: ESOPHAGOGASTRODUODENOSCOPY (EGD);  Surgeon: Lin Landsman, MD;  Location: [facility] ENDOSCOPY;  Service: Gastroenterology;  Laterality: N/A;  . ESOPHAGOGASTRODUODENOSCOPY (EGD) WITH PROPOFOL N/A 11/03/2017   Procedure: ESOPHAGOGASTRODUODENOSCOPY (EGD) WITH PROPOFOL;  Surgeon: Lin Landsman, MD;  Location: [facility] ENDOSCOPY;  Service: Gastroenterology;  Laterality: N/A;  . EYE SURGERY    . HERNIA REPAIR     "navel"  . LEFT HEART CATH AND CORONARY ANGIOGRAPHY N/A 08/14/2016   Procedure: Left Heart Cath and Coronary Angiography;  Surgeon: [name], MD;  Location: [place] CV LAB;  Service: Cardiovascular;  Laterality: N/A;  . PRESSURE SENSOR/CARDIOMEMS N/A 09/23/2019   Procedure: PRESSURE SENSOR/CARDIOMEMS;  Surgeon: Jolaine Artist, MD;  Location: [place] CV LAB;  Service: Cardiovascular;  Laterality: N/A;  . RIGHT HEART CATH N/A 09/23/2019   Procedure: RIGHT HEART CATH;  Surgeon: Jolaine Artist, MD;  Location: [place] CV LAB;  Service: Cardiovascular;  Laterality: N/A;  . RIGHT/LEFT HEART CATH AND CORONARY ANGIOGRAPHY N/A 09/01/2018   Procedure: RIGHT/LEFT HEART CATH AND CORONARY ANGIOGRAPHY;  Surgeon: [name], MD;  Location: [place] CV LAB;  Service: Cardiovascular;  Laterality: N/A;  . RIGHT/LEFT HEART CATH AND CORONARY ANGIOGRAPHY N/A 04/13/2019   Procedure: RIGHT/LEFT HEART CATH AND CORONARY ANGIOGRAPHY;  Surgeon: [name], MD;  Location: [place] CV LAB;  Service: Cardiovascular;  Laterality: N/A;  . TOE AMPUTATION Right    "big toe"    Allergies  Allergies  Allergen Reactions  . Clopidogrel Anaphylaxis    altered mental status;  (electrolytes "out of wack" and confusion per family; THEY DO NOT REMEMBER ANY OTHER REACTION)- MSB 10/10/15  . Ciprofloxacin Itching  . Mirtazapine Diarrhea    Bad dreams   . Spironolactone      gynecomastia    History of Present Illness    86 year old male with above complex past medical history including CAD, mixed ischemic and nonischemic cardiomyopathy, HFrEF, hypertension, hyperlipidemia, type 2 diabetes mellitus, paroxysmal atrial fibrillation (on warfarin), stage III-IV chronic kidney disease, CML, iron deficiency anemia, GI bleed, BPH, and hypothyroidism.  He previously underwent stenting of the right coronary artery in June 2018 and was noted to have residual 70% left main disease.  He was evaluated by CT surgery at that time was not felt to be a suitable surgical candidate and he was medically managed.  He continued to have dyspnea on exertion and underwent diagnostic catheterization in June 2020 showing 70% left main stenosis with an abnormal iFR of 0.86, though drop in iFR was due to diffuse proximal LAD disease and there was only a small gradient involving the ostial left main.  Nitrate therapy was adjusted.  Follow-up echocardiogram showed slight reduction LV function to 35-40%.  Due to ongoing symptoms throughout the remainder of 2020, he finally underwent PCI and drug-eluting stent placement of the proximal LAD in January of this year.  Follow-up echo in February showed an EF of 30-35% with global hypokinesis.  He did not note significant improvement  in symptoms and was referred to the heart failure clinic.  Event monitoring in April of this year did not show any significant arrhythmias.  He was last seen here in June, at which time he was relatively stable on torsemide 60 mg daily.  He underwent placement of CardioMEMS and right heart catheterization on July 8.  Right heart cath notable for an RA of 8, PA 34/5 (17), PCWP 12, and normal output/index.  Since then, he did note slight weight gain yesterday and took an additional 20 mg of torsemide.  [name] feeling well today and although he reported feeling at 50% on June 30, he says he is now is about 85%.  He continues to participate in  cardiac rehabilitation and has not been having any significant chest pain.  He continues to note occasional dyspnea on exertion but overall, this has improved.  He denies PND, orthopnea, dizziness, syncope, palpitations, or edema.  He has noted a drop in his appetite.  Home Medications    Prior to Admission medications   Medication Sig Start Date End Date Taking? Authorizing Provider  ACCU-CHEK AVIVA PLUS test strip Use to check blood sugar up to 2 times daily 04/20/19  Yes Karamalegos, [name], DO  Accu-Chek Softclix Lancets lancets Use to check blood sugar up to 2 times daily. 04/27/19  Yes Karamalegos, [name], DO  acetaminophen (TYLENOL) 500 MG tablet Take 1,000 mg by mouth every 6 (six) hours as needed for moderate pain or headache.    Yes [provider]  Alcohol Swabs 70 % PADS Use when checking blood sugar up to 3x daily 04/09/17  Yes Karamalegos, [name], DO  allopurinol (ZYLOPRIM) 100 MG tablet Take 1 tablet (100 mg total) by mouth daily. 07/15/19  Yes Lyda Jester M, PA-C  aspirin 81 MG chewable tablet Chew 81 mg by mouth daily.   Yes [provider]  Blood Glucose Monitoring Suppl (ACCU-CHEK AVIVA PLUS) w/Device KIT Use to check blood sugar up to 2 times daily 11/25/18  Yes Karamalegos, [name], DO  bosutinib (BOSULIF) 100 MG tablet Take 100 mg by mouth at bedtime as needed (tired/not feeling well). Take with food.    Yes [provider]  Dulaglutide (TRULICITY) 7.62 UQ/3.3HL SOPN Inject 0.75 mg into the skin once a week. Patient taking differently: Inject 0.75 mg into the skin every Saturday.  10/31/17  Yes Karamalegos, [name], DO  empagliflozin (JARDIANCE) 10 MG TABS tablet Take 10 mg by mouth daily before breakfast. 06/07/19  Yes Bensimhon, Shaune Pascal, MD  gabapentin (NEURONTIN) 300 MG capsule Take 2 capsules (600 mg total) by mouth 2 (two) times daily. Max dose = 1259m daily for kidney function 03/25/19  Yes Karamalegos, ADevonne Doughty DO   hydrALAZINE (APRESOLINE) 25 MG tablet Take 1 tablet (25 mg total) by mouth 3 (three) times daily. 09/15/19 12/14/19 Yes End, CHarrell Gave MD  Insulin Glargine ([name] KWIKPEN) 100 UNIT/ML SOPN Inject 38 Units into the skin daily.    Yes [provider]  Insulin Pen Needle 31G X 5 MM MISC 31 g by Does not apply route as directed. 04/16/17  Yes KMikey [employer] NP  isosorbide mononitrate (IMDUR) 60 MG 24 hr tablet Take 1.5 tablets (90 mg total) by mouth daily. 06/28/19  Yes End, CHarrell Gave MD  levothyroxine (SYNTHROID) 25 MCG tablet Take 1 tablet (25 mcg total) by mouth daily before breakfast. 08/06/19  Yes Karamalegos, ADevonne Doughty DO  losartan (COZAAR) 25 MG tablet Take 1 tablet (25 mg  total) by mouth daily. 07/05/19 10/03/19 Yes End, [name], MD  Menthol-Zinc Oxide (GOLD BOND EX) Apply 1 application topically daily as needed (muscle pain).   Yes [provider]  metoprolol succinate (TOPROL-XL) 25 MG 24 hr tablet Take 1 tablet (25 mg total) by mouth daily. 06/28/19  Yes End, [name], MD  Multiple Vitamin (MULTIVITAMIN WITH MINERALS) TABS tablet Take 1 tablet by mouth daily.   Yes [provider]  nitroGLYCERIN (NITROSTAT) 0.4 MG SL tablet Place 1 tablet (0.4 mg total) under the tongue every 5 (five) minutes x 3 doses as needed for chest pain. 12/23/18  Yes End, [name], MD  omeprazole (PRILOSEC) 20 MG capsule Take 20 mg by mouth in the morning and at bedtime. Morning & afternoon   Yes [provider]  Polyethyl Glycol-Propyl Glycol (SYSTANE OP) Place 1 drop into both eyes 3 (three) times daily as needed (dry/irritated eyes.).    Yes [provider]  polyethylene glycol (MIRALAX / GLYCOLAX) 17 g packet Take 17 g by mouth daily as needed for mild constipation.    Yes [provider]  potassium chloride SA (KLOR-CON) 20 MEQ tablet Take 1 tablet (20 mEq total) by mouth daily. Patient taking differently: Take 20 mEq by mouth 2 (two) times  daily.  07/15/19 10/13/19 Yes Simmons, Brittainy M, PA-C  PROVENTIL HFA 108 (90 Base) MCG/ACT inhaler Inhale 2 puffs into the lungs every 6 (six) hours as needed for wheezing or shortness of breath. 07/16/18  Yes Karamalegos, [name], DO  ranolazine (RANEXA) 500 MG 12 hr tablet Take 1 tablet (500 mg total) by mouth 2 (two) times daily. 06/28/19  Yes End, [name], MD  rosuvastatin (CRESTOR) 5 MG tablet Take 1 tablet (5 mg total) by mouth daily. Patient taking differently: Take 5 mg by mouth every evening.  05/26/19 09/14/20 Yes End, [name], MD  torsemide (DEMADEX) 20 MG tablet Take 3 tablets (60 mg total) by mouth daily. 08/05/19  Yes Clegg, Amy D, NP  traMADol (ULTRAM) 50 MG tablet Take 1-2 tablets (50-100 mg total) by mouth at bedtime as needed. Patient taking differently: Take 50-100 mg by mouth at bedtime as needed (pain.).  06/08/19  Yes Karamalegos, [name], DO  warfarin (COUMADIN) 3 MG tablet Take 1 to 1 1/2 tablets by mouth daily as directed by the coumadin clinic Patient taking differently: Take 3-4.5 mg by mouth See admin instructions. Take 1 tablet (3 mg) by mouth in the evenings on Sundays, Tuesdays, Wednesdays,Thursdays, & Saturdays. Take 1.5 tablets (4.5 mg) by mouth in the evenings on Mondays & Fridays. 08/06/19  Yes End, [name], MD    Review of Systems    Overall improved though still notes some dyspnea on exertion.  Notes poor appetite but denies chest pain, palpitations, PND, orthopnea, dizziness, syncope, edema.  All other systems reviewed and are otherwise negative except as noted above.  Physical Exam    VS:  BP 130/62 (BP Location: Left Arm, Patient Position: Sitting, Cuff Size: Large)   Pulse 87   Ht '5\' 4"'  (1.626 m)   Wt 201 lb 2 oz (91.2 kg)   SpO2 98%   BMI 34.52 kg/m  , BMI Body mass index is 34.52 kg/m.  ReDS Vest equals 34% GEN: Well nourished, well developed, in no acute distress. HEENT: normal. Neck: Supple, no JVD, carotid bruits, or  masses. Cardiac: RRR, no murmurs, rubs, or gallops. No clubbing, cyanosis.  Trace bilateral ankle edema.  Radials/PT 1+ and equal bilaterally.  Respiratory:  Respirations  regular and unlabored, clear to auscultation bilaterally. GI: Soft, nontender, nondistended, BS + x 4. MS: no deformity or atrophy. Skin: warm and dry, no rash. Neuro:  Strength and sensation are intact. Psych: Normal affect.  Accessory Clinical Findings    ECG personally reviewed by me today -regular sinus rhythm, 87, left axis deviation, left anterior fascicular block, right bundle branch block - no acute changes.  Lab Results  Component Value Date   WBC 8.3 09/16/2019   HGB 12.2 (L) 09/23/2019   HGB 11.6 (L) 09/23/2019   HCT 36.0 (L) 09/23/2019   HCT 34.0 (L) 09/23/2019   MCV 88.9 09/16/2019   PLT 191 09/16/2019   Lab Results  Component Value Date   CREATININE 1.59 (H) 09/16/2019   BUN 17 09/16/2019   NA 142 09/23/2019   NA 146 (H) 09/23/2019   K 4.4 09/23/2019   K 4.0 09/23/2019   CL 101 09/16/2019   CO2 21 (L) 09/16/2019   Lab Results  Component Value Date   ALT 14 08/28/2018   AST 23 08/28/2018   ALKPHOS 82 08/28/2018   BILITOT 0.5 08/28/2018   Lab Results  Component Value Date   CHOL 110 08/28/2018   HDL 28 (L) 08/28/2018   LDLCALC 51 08/28/2018   TRIG 155 (H) 08/28/2018   CHOLHDL 3.9 08/28/2018    Lab Results  Component Value Date   HGBA1C 7.1 (A) 09/08/2019    Assessment & Plan    1.  Chronic diastolic diastolic congestive heart failure/ischemic cardiomyopathy: EF 30-35% with global hypokinesis by echo in February of this year.  [name] now status post CardioMEMS placement on July 8.  Right heart catheterization at that time showed stable filling pressures on torsemide 60 mg daily.  He did note a slight increase in his weight earlier this week and did take an extra 20 mg of torsemide yesterday.  He notes that overall, he has been feeling much better with his breathing at about 85% of  prior baseline (reported to 50% of her baseline at June visit).  He and his wife have been having trouble getting his cardio mems monitoring set up and they are scheduled for a tech call later this evening.  His ReDS Vest today was normal at 34%.  Continue current doses of beta-blocker, ARB, hydralazine, nitrate, and torsemide.  [name] not on an MRA in the setting of gynecomastia and stage III-IV chronic kidney disease.  2.  Coronary artery disease: Most recently status post LAD stenting.  Though he did not notice significant improvement in symptoms following PCI, with ongoing cardiac rehabilitation, he has noted improved exercise tolerance.  He has not been experiencing any chest pain remains on aspirin, nitrate, beta-blocker, Ranexa, and statin therapy.  3.  Paroxysmal atrial fibrillation: Maintaining sinus rhythm on beta-blocker therapy.  [name] anticoagulated with warfarin.  4.  Essential hypertension: Blood pressure stable today.  5.  Hyperlipidemia: LDL of 51 last June.  [name] due for follow-up but is not fasting today.  I will enter an order for labs and [place] will [facility] medical mall to have this checked when fasting.  Continue statin therapy.  6.  Stage III-IV chronic kidney disease: Creatinine stable at recent check on July 1 (1.59).  7.  Disposition: He has follow-up with heart failure clinic in mid August.  We will plan to see him back in late September/early October.  [name], NP 10/01/2019, 5:16 PM

## 2019-10-02 MED ORDER — LOSARTAN 25 MG TABLET
ORAL | 0 days
Start: 2019-10-02 — End: ?

## 2019-10-05 ENCOUNTER — Other Ambulatory Visit (HOSPITAL_COMMUNITY): Payer: Self-pay

## 2019-10-05 MED ORDER — GABAPENTIN 300 MG CAPSULE
0 days
Start: 2019-10-05 — End: ?

## 2019-10-05 MED ORDER — POTASSIUM CHLORIDE CRYS ER 20 MEQ PO TBCR: 20.0000 meq | EXTENDED_RELEASE_TABLET | Freq: Two times a day (BID) | ORAL | 1 refills | Status: DC

## 2019-10-06 ENCOUNTER — Ambulatory Visit: Payer: Self-pay | Admitting: Pharmacist

## 2019-10-06 ENCOUNTER — Telehealth: Payer: Medicare HMO

## 2019-10-06 NOTE — Chronic Care Management (AMB) (Signed)
  Chronic Care Management   Follow Up Note   10/06/2019 Name: Lucas Caldwell MRN: [id number] DOB: Jan 17, 1934  Referred by: Lucas Hauser, DO Reason for referral : Chronic Care Management (Patient Phone Call)   Lucas Caldwell is a 86 y.o. year old male who is a primary care patient of Lucas Hauser, DO. The CCM team was consulted for assistance with chronic disease management and care coordination needs.    I reached out to Lucas Caldwell by phone today. Patient states that he is not home and asks that I call his home number to reach Lucas Caldwell to review medications today.  Unable to reach Lucas Caldwell today by phone and unable to leave a message as no voicemail picks up.  Plan  The care management team will reach out to the patient again over the next 30 days.   [name], PharmD, [place] [employer] [employer] [phone]

## 2019-10-07 ENCOUNTER — Telehealth: Payer: Self-pay

## 2019-10-07 ENCOUNTER — Other Ambulatory Visit: Payer: Self-pay | Admitting: Family Medicine

## 2019-10-07 ENCOUNTER — Telehealth: Payer: Self-pay | Admitting: Internal Medicine

## 2019-10-07 DIAGNOSIS — N183 Chronic kidney disease, stage 3 unspecified: Secondary | ICD-10-CM | POA: Diagnosis not present

## 2019-10-07 DIAGNOSIS — K219 Gastro-esophageal reflux disease without esophagitis: Secondary | ICD-10-CM | POA: Diagnosis not present

## 2019-10-07 DIAGNOSIS — E669 Obesity, unspecified: Secondary | ICD-10-CM | POA: Diagnosis not present

## 2019-10-07 DIAGNOSIS — E039 Hypothyroidism, unspecified: Secondary | ICD-10-CM | POA: Diagnosis not present

## 2019-10-07 DIAGNOSIS — M109 Gout, unspecified: Secondary | ICD-10-CM | POA: Diagnosis not present

## 2019-10-07 DIAGNOSIS — C9290 Myeloid leukemia, unspecified, not having achieved remission: Secondary | ICD-10-CM | POA: Diagnosis not present

## 2019-10-07 DIAGNOSIS — E1142 Type 2 diabetes mellitus with diabetic polyneuropathy: Secondary | ICD-10-CM | POA: Diagnosis not present

## 2019-10-07 DIAGNOSIS — I509 Heart failure, unspecified: Secondary | ICD-10-CM | POA: Diagnosis not present

## 2019-10-07 DIAGNOSIS — E1151 Type 2 diabetes mellitus with diabetic peripheral angiopathy without gangrene: Secondary | ICD-10-CM | POA: Diagnosis not present

## 2019-10-07 DIAGNOSIS — J449 Chronic obstructive pulmonary disease, unspecified: Secondary | ICD-10-CM | POA: Diagnosis not present

## 2019-10-07 DIAGNOSIS — C9210 Chronic myeloid leukemia, BCR/ABL-positive, not having achieved remission: Secondary | ICD-10-CM | POA: Diagnosis not present

## 2019-10-07 DIAGNOSIS — Z794 Long term (current) use of insulin: Secondary | ICD-10-CM | POA: Diagnosis not present

## 2019-10-07 DIAGNOSIS — I4891 Unspecified atrial fibrillation: Secondary | ICD-10-CM | POA: Diagnosis not present

## 2019-10-07 DIAGNOSIS — E785 Hyperlipidemia, unspecified: Secondary | ICD-10-CM | POA: Diagnosis not present

## 2019-10-07 DIAGNOSIS — Z6833 Body mass index (BMI) 33.0-33.9, adult: Secondary | ICD-10-CM | POA: Diagnosis not present

## 2019-10-07 DIAGNOSIS — Z7901 Long term (current) use of anticoagulants: Secondary | ICD-10-CM | POA: Diagnosis not present

## 2019-10-07 DIAGNOSIS — E1122 Type 2 diabetes mellitus with diabetic chronic kidney disease: Secondary | ICD-10-CM | POA: Diagnosis not present

## 2019-10-07 DIAGNOSIS — E876 Hypokalemia: Secondary | ICD-10-CM | POA: Diagnosis not present

## 2019-10-07 DIAGNOSIS — H547 Unspecified visual loss: Secondary | ICD-10-CM | POA: Diagnosis not present

## 2019-10-07 NOTE — Telephone Encounter (Signed)
Called patient's wife (DPR). She stated patient has a spot in his groin where he had heart cath that is bleeding here and there. [name] stated home health nurse looked at it and stated it looks like a scratch by his cath site. Informed patient's wife to keep an eye on it and if it starts bleeding to put a gauze on it and put pressure on it for at least 5 minutes. Informed her if gauze becomes saturated with blood to hold pressure and call 911. [name] stated it has not been like that, that it's old dry blood and not bright red. [name] wanted patient to come in and have someone look at his site.  Will forward to Dr. Saunders Revel for further advisement.

## 2019-10-07 NOTE — Telephone Encounter (Signed)
LMOM

## 2019-10-07 NOTE — Telephone Encounter (Signed)
Patients wife calling in stating that his recent incision is "not looking right". She states there is blood near it. Patients wife would like for patient to be seen in office to get this under control. Wife states patient had a procedure in [place] on 7/8 by [name]

## 2019-10-08 ENCOUNTER — Other Ambulatory Visit: Payer: Self-pay

## 2019-10-08 ENCOUNTER — Ambulatory Visit (INDEPENDENT_AMBULATORY_CARE_PROVIDER_SITE_OTHER): Payer: Medicare HMO | Admitting: Internal Medicine

## 2019-10-08 ENCOUNTER — Encounter: Payer: Self-pay | Admitting: Internal Medicine

## 2019-10-08 ENCOUNTER — Other Ambulatory Visit: Payer: Self-pay | Admitting: Family Medicine

## 2019-10-08 VITALS — BP 130/74 | HR 72 | Ht 64.0 in | Wt 201.0 lb

## 2019-10-08 DIAGNOSIS — L089 Local infection of the skin and subcutaneous tissue, unspecified: Secondary | ICD-10-CM | POA: Insufficient documentation

## 2019-10-08 DIAGNOSIS — T148XXA Other injury of unspecified body region, initial encounter: Secondary | ICD-10-CM | POA: Diagnosis not present

## 2019-10-08 DIAGNOSIS — I5022 Chronic systolic (congestive) heart failure: Secondary | ICD-10-CM

## 2019-10-08 DIAGNOSIS — E1142 Type 2 diabetes mellitus with diabetic polyneuropathy: Secondary | ICD-10-CM

## 2019-10-08 MED ORDER — CEPHALEXIN 500 MG PO CAPS: 500.0000 mg | ORAL_CAPSULE | Freq: Three times a day (TID) | ORAL | 0 refills | Status: DC

## 2019-10-08 NOTE — Patient Instructions (Addendum)
Medication Instructions:  - Your physician has recommended you make the following change in your medication:   1) Start Keflex 500 mg- take 1 capsule three times a day x 7 days  *If you need a refill on your cardiac medications before your next appointment, please call your pharmacy*   Lab Work: - none ordered  If you have labs (blood work) drawn today and your tests are completely normal, you will receive your results only by: [name] MyChart Message (if you have MyChart) OR . A paper copy in the mail If you have any lab test that is abnormal or we need to change your treatment, we will call you to review the results.   Testing/Procedures: - none ordered   Follow-Up: At [facility], you and your health needs are our priority.  As part of our continuing mission to provide you with exceptional heart care, we have created designated Provider Care Teams.  These Care Teams include your primary Cardiologist (physician) and Advanced Practice Providers (APPs -  Physician Assistants and Nurse Practitioners) who all work together to provide you with the care you need, when you need it.  We recommend signing up for the patient portal called "MyChart".  Sign up information is provided on this After Visit Summary.  MyChart is used to connect with patients for Virtual Visits (Telemedicine).  Patients are able to view lab/test results, encounter notes, upcoming appointments, etc.  Non-urgent messages can be sent to your provider as well.   To learn more about what you can do with MyChart, go to [email or web address].    Your next appointment:   2 week(s)- wound check  The format for your next appointment:   In Person  Provider:    You may see [name], MD or one of the following Advanced Practice Providers on your designated Care Team:    [name], NP  [name], PA-C  [name], PA-C    Other Instructions n/a

## 2019-10-08 NOTE — Progress Notes (Signed)
Follow-up Outpatient Visit Date: 10/08/2019  Primary Care Provider: [name], DO [address] [place] [address]  Chief Complaint: Wound problem  HPI:  Lucas Caldwell is a 86 y.o. male with history of coronary artery disease,chronic systolic heart failure due to likelymixed ischemic and nonischemic cardiomyopathy, hypertension, hyperlipidemia, type 2 diabetes mellitus, paroxysmal atrial fibrillation, chronic kidney disease stage III, CML, iron deficiency anemia,GI bleed due to duodenal ulcers,BPH, and hypothyroidism, who presents for evaluation of right groin wound.  Patient underwent CardioMEMS placement by [name] 2 weeks ago.  He has noticed some intermittent discomfort and small amount of drainage from the venotomy site in the right groin.  He denies fevers and chills.  He has not had any significant dyspnea or leg edema.  The site was evaluated by home health nursing yesterday, Lucas Caldwell was advised to contact us for further recommendations.  --------------------------------------------------------------------------------------------------  Past Medical History:  Diagnosis Date  . Arthritis    "probably in his legs" (08/15/2016)  . Blind right eye   . BPH (benign prostatic hyperplasia)   . Breast asymmetry    Left breast is larger, present for several years.  [name] CAD (coronary artery disease)    a. Cath in the late 90's - reportedly ok;  b. 2014 s/p stenting x 2 @ [facility]; c 08/19/16 Cath/PCI with DES -> RCA, plan to treat LM 70% medically. Seen by surgery and felt to be too high risk for CABG; d. 08/2018 Cath: LM 70 (iFR 0.86), LAD 20ost, 40p, 50/4m D1 70, LCX patent stent, OM1 30, RCA patent stent, 490mSR, 40d, RPDA 30. PCWP 8. CO/CI 4.0/2.1; e. 03/2019 PCI to LAD (2.5x15 Resolute Onyx DES).  . Chronic combined systolic (congestive) and diastolic (congestive) heart failure (H[facility]   a. Previously reduced EF-->50% by echo in 2012;  b. 06/2015 Echo: EF 50-55%  c. 07/2016  Echo: EF 45-50%; d. 12/2017 Echo: EF 55%, Gr1 DD, mild MR; e. 08/2018 Echo: EF 35-40%, mildly dil PA; f. 04/2019 Echo: EF 30-35%, glob HK, nl RV fxn; g. s/p Cardiomems.  . Chronic kidney disease (CKD), stage IV (severe) ([facility]  . CML (chronic myelocytic leukemia) (HCNorth San Juan  . GERD (gastroesophageal reflux disease)   . GIB (gastrointestinal bleeding)    a. 12/2017 3 unit PRBC GIB in setting of coumadin-->Endo/colon multiple duodenal ulcers and a single bleeding ulcer in the proximal ascending colon status post hemostatic clipping x2.  . Gout   . Hyperlipemia   . Hypertension   . Hypothyroidism   . Iron deficiency anemia   . Ischemic cardiomyopathy    a. Previously reduced EF-->50% by echo in 2012;  b. 06/2015 Echo: EF 50-55%, Gr2 DD, mild MR, mildly dil LA, Ao sclerosis, mild TR;  c. 07/2016 Echo: EF 45-50%; d. 12/2017 Echo: EF 55%, Gr1 DD, mild MR; e. 08/2018 Echo: EF 35-40%; f. 04/2019 Echo: EF 30-35%.  . Migraine    "in the 1960s" (08/15/2016)  . PAF (paroxysmal atrial fibrillation) (HCC)    a. ? Dx 2014-->s/p DCCV;  b. CHA2DS2VASc = 6--> Coumadin.  . Prostate cancer (HCGreens[facility]  . RBBB   . Type II diabetes mellitus (HCPleasant Hill   Past Surgical History:  Procedure Laterality Date  . CATARACT EXTRACTION W/ INTRAOCULAR LENS  IMPLANT, BILATERAL Bilateral   . COLONOSCOPY N/A 01/13/2018   Procedure: COLONOSCOPY;  Surgeon: VaLin LandsmanMD;  [facility];  Service: Gastroenterology;  Laterality: N/A;  . COLONOSCOPY WITH PROPOFOL N/A  01/01/2018   Procedure: COLONOSCOPY WITH PROPOFOL;  Surgeon: Lin Landsman, MD;  [facility] END[facility]COPY;  Service: Gastroenterology;  Laterality: N/A;  . CORONARY ANGIOPLASTY WITH STENT PLACEMENT  09/2012   2 stents  . CORONARY STENT INTERVENTION N/A 08/19/2016   Procedure: Coronary Stent Intervention;  Surgeon: [name], MD;  Location: [place] CV LAB;  Service: Cardiovascular;  Laterality: N/A;  . CORONARY STENT INTERVENTION N/A 04/13/2019    Procedure: CORONARY STENT INTERVENTION;  Surgeon: [name], MD;  Location: [place] CV LAB;  Service: Cardiovascular;  Laterality: N/A;  . ESOPHAGEAL MANOMETRY N/A 12/16/2017   Procedure: ESOPHAGEAL MANOMETRY (EM);  Surgeon: Lin Landsman, MD;  Location: [facility] ENDOSCOPY;  Service: Gastroenterology;  Laterality: N/A;  . ESOPHAGOGASTRODUODENOSCOPY N/A 12/20/2016   Procedure: ESOPHAGOGASTRODUODENOSCOPY (EGD);  Surgeon: Lin Landsman, MD;  Location[facility]COPY;  Service: Gastroenterology;  Laterality: N/A;  . ESOPHAGOGASTRODUODENOSCOPY N/A 01/13/2018   Procedure: ESOPHAGOGASTRODUODENOSCOPY (EGD);  Surgeon: Lin Landsman, MD;  Location: [facility] ENDOSCOPY;  Service: Gastroenterology;  Laterality: N/A;  . ESOPHAGOGASTRODUODENOSCOPY (EGD) WITH PROPOFOL N/A 11/03/2017   Procedure: ESOPHAGOGASTRODUODENOSCOPY (EGD) WITH PROPOFOL;  Surgeon: Lin Landsman, MD;  Location[facility];  Service: Gastroenterology;  Laterality: N/A;  . EYE SURGERY    . HERNIA REPAIR     "navel"  . LEFT HEART CATH AND CORONARY ANGIOGRAPHY N/A 08/14/2016   Procedure: Left Heart Cath and Coronary Angiography;  Surgeon: [name], MD;  Location: [place] CV LAB;  Service: Cardiovascular;  Laterality: N/A;  . PRESSURE SENSOR/CARDIOMEMS N/A 09/23/2019   Procedure: PRESSURE SENSOR/CARDIOMEMS;  Surgeon: Jolaine Artist, MD;  Location: [place] CV LAB;  Service: Cardiovascular;  Laterality: N/A;  . RIGHT HEART CATH N/A 09/23/2019   Procedure: RIGHT HEART CATH;  Surgeon: Jolaine Artist, MD;  Location: [place] CV LAB;  Service: Cardiovascular;  Laterality: N/A;  . RIGHT/LEFT HEART CATH AND CORONARY ANGIOGRAPHY N/A 09/01/2018   Procedure: RIGHT/LEFT HEART CATH AND CORONARY ANGIOGRAPHY;  Surgeon: [name], MD;  Location: [place] CV LAB;  Service: Cardiovascular;  Laterality: N/A;  . RIGHT/LEFT HEART CATH AND CORONARY ANGIOGRAPHY N/A 04/13/2019   Procedure: RIGHT/LEFT  HEART CATH AND CORONARY ANGIOGRAPHY;  Surgeon: [name], MD;  Location: [place] CV LAB;  Service: Cardiovascular;  Laterality: N/A;  . TOE AMPUTATION Right    "big toe"    Current Meds  Medication Sig  . ACCU-CHEK AVIVA PLUS test strip Use to check blood sugar up to 2 times daily  . Accu-Chek Softclix Lancets lancets Use to check blood sugar up to 2 times daily.  [name] acetaminophen (TYLENOL) 500 MG tablet Take 1,000 mg by mouth every 6 (six) hours as needed for moderate pain or headache.   . Alcohol Swabs 70 % PADS Use when checking blood sugar up to 3x daily  . allopurinol (ZYLOPRIM) 100 MG tablet Take 1 tablet (100 mg total) by mouth daily.  [name] aspirin 81 MG chewable tablet Chew 81 mg by mouth daily.  . Blood Glucose Monitoring Suppl (ACCU-CHEK AVIVA PLUS) w/Device KIT Use to check blood sugar up to 2 times daily  . bosutinib (BOSULIF) 100 MG tablet Take 100 mg by mouth at bedtime as needed (tired/not feeling well). Take with food.   . Dulaglutide (TRULICITY) 7.74 JO/8.7OM SOPN Inject 0.75 mg into the skin once a week. (Patient taking differently: Inject 0.75 mg into the skin every Saturday. )  . empagliflozin (JARDIANCE) 10 MG TABS tablet Take 10 mg by mouth daily before breakfast.  .  gabapentin (NEURONTIN) 300 MG capsule Take 2 capsules (600 mg total) by mouth 2 (two) times daily. Max dose = 1233m daily for kidney function  . hydrALAZINE (APRESOLINE) 25 MG tablet Take 1 tablet (25 mg total) by mouth 3 (three) times daily.  . Insulin Glargine ([name] KWIKPEN) 100 UNIT/ML SOPN Inject 38 Units into the skin daily.   . Insulin Pen Needle 31G X 5 MM MISC 31 g by Does not apply route as directed.  . isosorbide mononitrate (IMDUR) 60 MG 24 hr tablet Take 1.5 tablets (90 mg total) by mouth daily.  .Marland Kitchenlevothyroxine (SYNTHROID) 25 MCG tablet Take 1 tablet (25 mcg total) by mouth daily before breakfast.  . losartan (COZAAR) 25 MG tablet Take 1 tablet (25 mg total) by mouth daily.  .  Menthol-Zinc Oxide (GOLD BOND EX) Apply 1 application topically daily as needed (muscle pain).  . metoprolol succinate (TOPROL-XL) 25 MG 24 hr tablet Take 1 tablet (25 mg total) by mouth daily.  . Multiple Vitamin (MULTIVITAMIN WITH MINERALS) TABS tablet Take 1 tablet by mouth daily.  . nitroGLYCERIN (NITROSTAT) 0.4 MG SL tablet Place 1 tablet (0.4 mg total) under the tongue every 5 (five) minutes x 3 doses as needed for chest pain.  .Marland Kitchenomeprazole (PRILOSEC) 20 MG capsule Take 20 mg by mouth in the morning and at bedtime. Morning & afternoon  . Polyethyl Glycol-Propyl Glycol (SYSTANE OP) Place 1 drop into both eyes 3 (three) times daily as needed (dry/irritated eyes.).   .Marland Kitchenpolyethylene glycol (MIRALAX / GLYCOLAX) 17 g packet Take 17 g by mouth daily as needed for mild constipation.   . potassium chloride SA (KLOR-CON) 20 MEQ tablet Take 1 tablet (20 mEq total) by mouth 2 (two) times daily.  .Marland KitchenPROVENTIL HFA 108 (90 Base) MCG/ACT inhaler Inhale 2 puffs into the lungs every 6 (six) hours as needed for wheezing or shortness of breath.  . ranolazine (RANEXA) 500 MG 12 hr tablet Take 1 tablet (500 mg total) by mouth 2 (two) times daily.  . rosuvastatin (CRESTOR) 5 MG tablet Take 1 tablet (5 mg total) by mouth daily. (Patient taking differently: Take 5 mg by mouth every evening. )  . torsemide (DEMADEX) 20 MG tablet Take 3 tablets (60 mg total) by mouth daily.  . traMADol (ULTRAM) 50 MG tablet Take 1-2 tablets (50-100 mg total) by mouth at bedtime as needed. (Patient taking differently: Take 50-100 mg by mouth at bedtime as needed (pain.). )  . warfarin (COUMADIN) 3 MG tablet Take 1 to 1 1/2 tablets by mouth daily as directed by the coumadin clinic (Patient taking differently: Take 3-4.5 mg by mouth See admin instructions. Take 1 tablet (3 mg) by mouth in the evenings on Sundays, Tuesdays, Wednesdays,Thursdays, & Saturdays. Take 1.5 tablets (4.5 mg) by mouth in the evenings on Mondays & Fridays.)     Allergies: Clopidogrel, Ciprofloxacin, Mirtazapine, and Spironolactone  Social History   Tobacco Use  . Smoking status: Former Smoker    Packs/day: 1.75    Years: 20.00    Pack years: 35.00    Types: Cigarettes    Start date: 1955    Quit date: 1975    Years since quitting: 46.5  . Smokeless tobacco: Former UNetwork engineer . Vaping Use: Never used  Substance Use Topics  . Alcohol use: No    Comment: previously drank heavily - quit 1979.  . Drug use: No    Family History  Problem Relation Age of Onset  .  Brain cancer Father   . Diabetes Sister   . Heart attack Sister     Review of Systems: A 12-system review of systems was performed and was negative except as noted in the HPI.  --------------------------------------------------------------------------------------------------  Physical Exam: BP (!) 130/74 (BP Location: Left Arm, Patient Position: Sitting, Cuff Size: Normal)   Pulse 72   Ht _0  (1.626 m)   Wt 201 lb (91.2 kg)   SpO2 98%   BMI 34.50 kg/m   General: NAD.  Accompanied by his wife. Skin: There are small puncture sites that are open in the right groin.  The more superior puncture has an exposed suture.  There is a small amount of serosanguineous drainage.  Surrounding hyperpigmentation of the skin is evident without erythema or warmth. Extremities: Trace pretibial edema bilaterally.  Lab Results  Component Value Date   WBC 8.3 09/16/2019   HGB 12.2 (L) 09/23/2019   HGB 11.6 (L) 09/23/2019   HCT 36.0 (L) 09/23/2019   HCT 34.0 (L) 09/23/2019   MCV 88.9 09/16/2019   PLT 191 09/16/2019    Lab Results  Component Value Date   NA 142 09/23/2019   NA 146 (H) 09/23/2019   K 4.4 09/23/2019   K 4.0 09/23/2019   CL 101 09/16/2019   CO2 21 (L) 09/16/2019   BUN 17 09/16/2019   CREATININE 1.59 (H) 09/16/2019   GLUCOSE 196 (H) 09/16/2019   ALT 14 08/28/2018    Lab Results  Component Value Date   CHOL 110 08/28/2018   HDL 28 (L) 08/28/2018    LDLCALC 51 08/28/2018   TRIG 155 (H) 08/28/2018   CHOLHDL 3.9 08/28/2018   --------------------------------------------------------------------------------------------------  ASSESSMENT AND PLAN: Right groin wound infection: Two small puncture wounds appear unapproximated and have small amount of serosanguineous drainage.  I suspect there may be some inflammation related to exposed suture.  I have spoken with [name], who placed the CardioMEMS 2 weeks ago, and have removed the exposed suture at the bedside today.  I do not see any evidence of abscess formation based on examination.  The site was covered with clean dressing.  I advised Lucas Caldwell to keep the site clean and dry until it has completely healed.  We will place him on cephalexin 500 mg 3 times daily for 1 week.  I will have him return to the office in 2 weeks to ensure that the site has completely healed.  He should contact us or seek immediate medical attention should he develop worsening pain at the site or constitutional symptoms such as fevers, chills, nausea/vomiting.  Chronic HFrEF: Lucas Caldwell reports feeling relatively well and appears grossly euvolemic on limited exam today.  He should continue remote monitoring through his CardioMEMS device as recommended by the heart failure clinic.  Follow-up: Return to clinic in 2 weeks with APP.  [name], MD 10/08/2019 8:11 AM

## 2019-10-08 NOTE — Telephone Encounter (Signed)
I spoke with Lucas Caldwell last night and advised him to come to the [place] office at 8 AM on 10/08/19 so that I can evaluate his cath site.  [name], MD [facility]

## 2019-10-08 NOTE — Telephone Encounter (Signed)
Pt seen in office this morning with Dr. Saunders Revel. Encounter resolved.

## 2019-10-08 NOTE — Telephone Encounter (Signed)
Requested medication (s) are due for refill today: n/a  Requested medication (s) are on the active medication list: Yes  Last refill:  03/25/19  Future visit scheduled: Yes  Notes to clinic:  Request states medication was filled 10/07/19, but it is not noted in pt. Chart.    Requested Prescriptions  Pending Prescriptions Disp Refills   gabapentin (NEURONTIN) 300 MG capsule [Pharmacy Med Name: gabapentin 300 mg capsule] 360 capsule 1    Sig: TAKE TWO CAPSULES BY MOUTH TWICE DAILY. (MAX DOSE = 1200MG  DAILY FOR KIDNEY FUNCTION)      Neurology: Anticonvulsants - gabapentin Passed - 10/08/2019  2:34 PM      Passed - Valid encounter within last 12 months    Recent Outpatient Visits           1 month ago Controlled type 2 diabetes mellitus with diabetic nephropathy, with long-term current use of insulin ([employer])   [place], DO   4 months ago Diabetic polyneuropathy associated with type 2 diabetes mellitus ([place])   [place], DO   8 months ago Controlled type 2 diabetes mellitus with diabetic nephropathy, with long-term current use of insulin [facility])   [place], DO   11 months ago Diabetic polyneuropathy associated with type 2 diabetes mellitus [facility])   [facility] [name], DO   1 year ago Uncontrolled type 2 diabetes mellitus with nephropathy [facility])   [place], [name], DO       Future Appointments             In 1 week [place], [name], MD [facility], [place]   In 1 month [place], [name], DO [facility], [place]   In 2 months End, [name], MD [facility], [place]

## 2019-10-11 ENCOUNTER — Encounter: Payer: Medicare HMO | Admitting: *Deleted

## 2019-10-11 ENCOUNTER — Other Ambulatory Visit: Payer: Self-pay

## 2019-10-11 DIAGNOSIS — I5042 Chronic combined systolic (congestive) and diastolic (congestive) heart failure: Secondary | ICD-10-CM

## 2019-10-11 DIAGNOSIS — K219 Gastro-esophageal reflux disease without esophagitis: Secondary | ICD-10-CM | POA: Diagnosis not present

## 2019-10-11 DIAGNOSIS — Z87891 Personal history of nicotine dependence: Secondary | ICD-10-CM | POA: Diagnosis not present

## 2019-10-11 DIAGNOSIS — E039 Hypothyroidism, unspecified: Secondary | ICD-10-CM | POA: Diagnosis not present

## 2019-10-11 DIAGNOSIS — E119 Type 2 diabetes mellitus without complications: Secondary | ICD-10-CM | POA: Diagnosis not present

## 2019-10-11 DIAGNOSIS — I110 Hypertensive heart disease with heart failure: Secondary | ICD-10-CM | POA: Diagnosis not present

## 2019-10-11 DIAGNOSIS — E785 Hyperlipidemia, unspecified: Secondary | ICD-10-CM | POA: Diagnosis not present

## 2019-10-11 DIAGNOSIS — Z7901 Long term (current) use of anticoagulants: Secondary | ICD-10-CM | POA: Diagnosis not present

## 2019-10-11 DIAGNOSIS — Z7982 Long term (current) use of aspirin: Secondary | ICD-10-CM | POA: Diagnosis not present

## 2019-10-11 DIAGNOSIS — L97511 Non-pressure chronic ulcer of other part of right foot limited to breakdown of skin: Secondary | ICD-10-CM | POA: Diagnosis not present

## 2019-10-11 DIAGNOSIS — E11621 Type 2 diabetes mellitus with foot ulcer: Secondary | ICD-10-CM | POA: Diagnosis not present

## 2019-10-11 DIAGNOSIS — I480 Paroxysmal atrial fibrillation: Secondary | ICD-10-CM | POA: Diagnosis not present

## 2019-10-11 DIAGNOSIS — Z794 Long term (current) use of insulin: Secondary | ICD-10-CM | POA: Diagnosis not present

## 2019-10-11 DIAGNOSIS — E1142 Type 2 diabetes mellitus with diabetic polyneuropathy: Secondary | ICD-10-CM | POA: Diagnosis not present

## 2019-10-11 DIAGNOSIS — Z79899 Other long term (current) drug therapy: Secondary | ICD-10-CM | POA: Diagnosis not present

## 2019-10-11 DIAGNOSIS — I5040 Unspecified combined systolic (congestive) and diastolic (congestive) heart failure: Secondary | ICD-10-CM | POA: Diagnosis present

## 2019-10-11 DIAGNOSIS — N400 Enlarged prostate without lower urinary tract symptoms: Secondary | ICD-10-CM | POA: Diagnosis not present

## 2019-10-11 NOTE — Progress Notes (Signed)
Daily Session Note  Patient Details  Name: ELRIC TIRADO MRN: [id number] Date of Birth: [birth year] Referring Provider:     Cardiac Rehab from 05/20/2019 in [facility] Cardiac and Pulmonary Rehab  Referring Provider Bensimhon      Encounter Date: 10/11/2019  Check In:  Session Check In - 10/11/19 1410      Check-In   Supervising physician immediately available to respond to emergencies See telemetry face sheet for immediately available ER MD    Location [facility]-Cardiac & Pulmonary Rehab    Staff Present Renita Papa, RN BSN;Joseph [address] [place], [place], ACSM CEP, Exercise Physiologist;Amanda Oletta Darter, [place], ACSM CEP, Exercise Physiologist    Virtual Visit No    Medication changes reported     No    Fall or balance concerns reported    No    Warm-up and Cool-down Performed on first and last piece of equipment    Resistance Training Performed Yes    VAD Patient? No    PAD/SET Patient? No      Pain Assessment   Currently in Pain? No/denies              Social History   Tobacco Use  Smoking Status Former Smoker  . Packs/day: 1.75  . Years: 20.00  . Pack years: 35.00  . Types: Cigarettes  . Start date: 9  . Quit date: 62  . Years since quitting: 46.5  Smokeless Tobacco Former Systems developer    Goals Met:  Independence with exercise equipment Exercise tolerated well No report of cardiac concerns or symptoms Strength training completed today  Goals Unmet:  Not Applicable  Comments: Pt able to follow exercise prescription today without complaint.  Will continue to monitor for progression.    Dr. Emily Filbert is Medical Director for [place] and [name].

## 2019-10-14 ENCOUNTER — Other Ambulatory Visit: Payer: Self-pay

## 2019-10-14 ENCOUNTER — Encounter: Payer: Medicare HMO | Admitting: *Deleted

## 2019-10-14 DIAGNOSIS — Z794 Long term (current) use of insulin: Secondary | ICD-10-CM | POA: Diagnosis not present

## 2019-10-14 DIAGNOSIS — Z87891 Personal history of nicotine dependence: Secondary | ICD-10-CM | POA: Diagnosis not present

## 2019-10-14 DIAGNOSIS — I5042 Chronic combined systolic (congestive) and diastolic (congestive) heart failure: Secondary | ICD-10-CM

## 2019-10-14 DIAGNOSIS — I110 Hypertensive heart disease with heart failure: Secondary | ICD-10-CM | POA: Diagnosis not present

## 2019-10-14 DIAGNOSIS — Z79899 Other long term (current) drug therapy: Secondary | ICD-10-CM | POA: Diagnosis not present

## 2019-10-14 DIAGNOSIS — E1121 Type 2 diabetes mellitus with diabetic nephropathy: Secondary | ICD-10-CM | POA: Diagnosis not present

## 2019-10-14 DIAGNOSIS — N400 Enlarged prostate without lower urinary tract symptoms: Secondary | ICD-10-CM | POA: Diagnosis not present

## 2019-10-14 DIAGNOSIS — Z7901 Long term (current) use of anticoagulants: Secondary | ICD-10-CM | POA: Diagnosis not present

## 2019-10-14 DIAGNOSIS — Z7982 Long term (current) use of aspirin: Secondary | ICD-10-CM | POA: Diagnosis not present

## 2019-10-14 DIAGNOSIS — E119 Type 2 diabetes mellitus without complications: Secondary | ICD-10-CM | POA: Diagnosis not present

## 2019-10-14 NOTE — Progress Notes (Signed)
Daily Session Note  Patient Details  Name: Lucas Caldwell MRN: [id number] Date of Birth: 04-06-1933 Referring Provider:     Cardiac Rehab from 05/20/2019 in [facility] Cardiac and Pulmonary Rehab  Referring Provider Bensimhon      Encounter Date: 10/14/2019  Check In:  Session Check In - 10/14/19 1337      Check-In   Supervising physician immediately available to respond to emergencies See telemetry face sheet for immediately available ER MD    Location [facility]-Cardiac & Pulmonary Rehab    Staff Present Renita Papa, RN BSN;Joseph [address] [place], [place], [place], CCRP, CCET    Virtual Visit No    Medication changes reported     No    Fall or balance concerns reported    No    Warm-up and Cool-down Performed on first and last piece of equipment    Resistance Training Performed Yes    VAD Patient? No    PAD/SET Patient? No      Pain Assessment   Currently in Pain? No/denies              Social History   Tobacco Use  Smoking Status Former Smoker  . Packs/day: 1.75  . Years: 20.00  . Pack years: 35.00  . Types: Cigarettes  . Start date: 58  . Quit date: 47  . Years since quitting: 46.6  Smokeless Tobacco Former Systems developer    Goals Met:  Independence with exercise equipment Exercise tolerated well No report of cardiac concerns or symptoms Strength training completed today  Goals Unmet:  Not Applicable  Comments: Pt able to follow exercise prescription today without complaint.  Will continue to monitor for progression.    Dr. Emily Filbert is Medical Director for [place] and [name].

## 2019-10-18 ENCOUNTER — Telehealth: Payer: Self-pay | Admitting: Internal Medicine

## 2019-10-18 ENCOUNTER — Encounter: Payer: Medicare HMO | Attending: Internal Medicine

## 2019-10-18 DIAGNOSIS — E039 Hypothyroidism, unspecified: Secondary | ICD-10-CM | POA: Insufficient documentation

## 2019-10-18 DIAGNOSIS — K219 Gastro-esophageal reflux disease without esophagitis: Secondary | ICD-10-CM | POA: Insufficient documentation

## 2019-10-18 DIAGNOSIS — Z794 Long term (current) use of insulin: Secondary | ICD-10-CM | POA: Insufficient documentation

## 2019-10-18 DIAGNOSIS — E785 Hyperlipidemia, unspecified: Secondary | ICD-10-CM | POA: Insufficient documentation

## 2019-10-18 DIAGNOSIS — Z87891 Personal history of nicotine dependence: Secondary | ICD-10-CM | POA: Insufficient documentation

## 2019-10-18 DIAGNOSIS — Z7901 Long term (current) use of anticoagulants: Secondary | ICD-10-CM | POA: Insufficient documentation

## 2019-10-18 DIAGNOSIS — I480 Paroxysmal atrial fibrillation: Secondary | ICD-10-CM | POA: Insufficient documentation

## 2019-10-18 DIAGNOSIS — Z79899 Other long term (current) drug therapy: Secondary | ICD-10-CM | POA: Insufficient documentation

## 2019-10-18 DIAGNOSIS — E119 Type 2 diabetes mellitus without complications: Secondary | ICD-10-CM | POA: Insufficient documentation

## 2019-10-18 DIAGNOSIS — I110 Hypertensive heart disease with heart failure: Secondary | ICD-10-CM | POA: Insufficient documentation

## 2019-10-18 DIAGNOSIS — N400 Enlarged prostate without lower urinary tract symptoms: Secondary | ICD-10-CM | POA: Insufficient documentation

## 2019-10-18 DIAGNOSIS — I5042 Chronic combined systolic (congestive) and diastolic (congestive) heart failure: Secondary | ICD-10-CM | POA: Insufficient documentation

## 2019-10-18 DIAGNOSIS — Z7982 Long term (current) use of aspirin: Secondary | ICD-10-CM | POA: Insufficient documentation

## 2019-10-18 NOTE — Telephone Encounter (Signed)
Spoke with wife on the phone with patient in the background. She is concerned because she does not know what the patient's fluid status is with the new CardioMems which was recently inserted. States she was told they would call if there was any issue with the fluid. Advised I do not see any reports in Epic at this time.  She says the patient is "short winded" which has been the complaint over the past several weeks. She asked the patient is he was ok while on the phone and I could hear him same "I'm better than I was." We discussed patient having appointment on 10/20/19 and to keep that appointment for further evaluation. She says his incision has healed nicely now.   I told her I was not sure how to find out about the CardioMems. Routing to [employer], RN in the [place] Failure clinic as I feel patient/wife need more education on how this works and not to worry if they do not receive a report call.

## 2019-10-18 NOTE — Telephone Encounter (Signed)
Pt c/o Shortness Of Breath: STAT if SOB developed within the last 24 hours or pt is noticeably SOB on the phone  1. Are you currently SOB (can you hear that pt is SOB on the phone)? yes  2. How long have you been experiencing SOB? Since Saturday  3. Are you SOB when sitting or when up moving around? Sitting and moving around  4. Are you currently experiencing any other symptoms? Dizziness and weakness  Patient wife states patient is currently on a fluid pill

## 2019-10-19 NOTE — Progress Notes (Signed)
Follow-up Outpatient Visit Date: 10/20/2019  Primary Care Provider: [name], DO [address] [address]  Chief Complaint: Balance problems  HPI:  Mr. Mineer is a 86 y.o. male with history of coronary artery disease,chronic systolic heart failure due to likelymixed ischemic and nonischemic cardiomyopathy, hypertension, hyperlipidemia, type 2 diabetes mellitus, paroxysmal atrial fibrillation, chronic kidney disease stage III, CML, iron deficiency anemia,GI bleed due to duodenal ulcers,BPH, and hypothyroidism, who presents for follow-up of heart failure and coronary artery disease as well as recent right groin wound infection.  I last saw him about 2 weeks ago for urgent evaluation of a wound in the right groin where CardioMEMS had been placed.  He was noted to have an exposed suture and as well as scant serosanguineous drainage from the puncture site.  The suture was removed in the office and Mr. Moncada placed on a 7-day course of cephalexin.  Today, Mr. Zeringue reports that his right groin wound has completely healed.  He feels like his breathing has also improved a little bit from prior visits.  He still has some fatigue and exertional dyspnea, however.  He denies chest pain, palpitations, and edema.  His chief complaint today is of feeling off balance.  Sometimes he stumbles though he has not fallen.  He ambulates with a cane.  --------------------------------------------------------------------------------------------------  Past Medical History:  Diagnosis Date  . Arthritis    "probably in his legs" (08/15/2016)  . Blind right eye   . BPH (benign prostatic hyperplasia)   . Breast asymmetry    Left breast is larger, present for several years.  [name] CAD (coronary artery disease)    a. Cath in the late 90's - reportedly ok;  b. 2014 s/p stenting x 2 @ [facility]; c 08/19/16 Cath/PCI with DES -> RCA, plan to treat LM 70% medically. Seen by surgery and felt to be too high  risk for CABG; d. 08/2018 Cath: LM 70 (iFR 0.86), LAD 20ost, 40p, 50/55m D1 70, LCX patent stent, OM1 30, RCA patent stent, 459mSR, 40d, RPDA 30. PCWP 8. CO/CI 4.0/2.1; e. 03/2019 PCI to LAD (2.5x15 Resolute Onyx DES).  . Chronic combined systolic (congestive) and diastolic (congestive) heart failure ([facility]   a. Previously reduced EF-->50% by echo in 2012;  b. 06/2015 Echo: EF 50-55%  c. 07/2016 Echo: EF 45-50%; d. 12/2017 Echo: EF 55%, Gr1 DD, mild MR; e. 08/2018 Echo: EF 35-40%, mildly dil PA; f. 04/2019 Echo: EF 30-35%, glob HK, nl RV fxn; g. s/p Cardiomems.  . Chronic kidney disease (CKD), stage IV (severe) ([facility]  . CML (chronic myelocytic leukemia) ([facility]  . GERD (gastroesophageal reflux disease)   . GIB (gastrointestinal bleeding)    a. 12/2017 3 unit PRBC GIB in setting of coumadin-->Endo/colon multiple duodenal ulcers and a single bleeding ulcer in the proximal ascending colon status post hemostatic clipping x2.  . Gout   . Hyperlipemia   . Hypertension   . Hypothyroidism   . Iron deficiency anemia   . Ischemic cardiomyopathy    a. Previously reduced EF-->50% by echo in 2012;  b. 06/2015 Echo: EF 50-55%, Gr2 DD, mild MR, mildly dil LA, Ao sclerosis, mild TR;  c. 07/2016 Echo: EF 45-50%; d. 12/2017 Echo: EF 55%, Gr1 DD, mild MR; e. 08/2018 Echo: EF 35-40%; f. 04/2019 Echo: EF 30-35%.  . Migraine    "in the 1960s" (08/15/2016)  . PAF (paroxysmal atrial fibrillation) (HCC)    a. ? Dx 2014-->s/p DCCV;  b. CHA2DS2VASc = 6--> Coumadin.  . Prostate cancer ([place])   . RBBB   . Type II diabetes mellitus ([place])    Past Surgical History:  Procedure Laterality Date  . CATARACT EXTRACTION W/ INTRAOCULAR LENS  IMPLANT, BILATERAL Bilateral   . COLONOSCOPY N/A 01/13/2018   Procedure: COLONOSCOPY;  Surgeon: Lin Landsman, MD;  Location: [facility] ENDOSCOPY;  Service: Gastroenterology;  Laterality: N/A;  . COLONOSCOPY WITH PROPOFOL N/A 01/01/2018   Procedure: COLONOSCOPY WITH PROPOFOL;  Surgeon: Lin Landsman, MD;  Location: [facility] ENDOSCOPY;  Service: Gastroenterology;  Laterality: N/A;  . CORONARY ANGIOPLASTY WITH STENT PLACEMENT  09/2012   2 stents  . CORONARY STENT INTERVENTION N/A 08/19/2016   Procedure: Coronary Stent Intervention;  Surgeon: [name], MD;  Location: [place] CV LAB;  Service: Cardiovascular;  Laterality: N/A;  . CORONARY STENT INTERVENTION N/A 04/13/2019   Procedure: CORONARY STENT INTERVENTION;  Surgeon: [name], MD;  Location: [place] CV LAB;  Service: Cardiovascular;  Laterality: N/A;  . ESOPHAGEAL MANOMETRY N/A 12/16/2017   Procedure: ESOPHAGEAL MANOMETRY (EM);  Surgeon: Lin Landsman, MD;  Location: [facility] ENDOSCOPY;  Service: Gastroenterology;  Laterality: N/A;  . ESOPHAGOGASTRODUODENOSCOPY N/A 12/20/2016   Procedure: ESOPHAGOGASTRODUODENOSCOPY (EGD);  Surgeon: Lin Landsman, MD;  Location: [facility] ENDOSCOPY;  Service: Gastroenterology;  Laterality: N/A;  . ESOPHAGOGASTRODUODENOSCOPY N/A 01/13/2018   Procedure: ESOPHAGOGASTRODUODENOSCOPY (EGD);  Surgeon: Lin Landsman, MD;  Location: [facility] ENDOSCOPY;  Service: Gastroenterology;  Laterality: N/A;  . ESOPHAGOGASTRODUODENOSCOPY (EGD) WITH PROPOFOL N/A 11/03/2017   Procedure: ESOPHAGOGASTRODUODENOSCOPY (EGD) WITH PROPOFOL;  Surgeon: Lin Landsman, MD;  Location: [facility] ENDOSCOPY;  Service: Gastroenterology;  Laterality: N/A;  . EYE SURGERY    . HERNIA REPAIR     "navel"  . LEFT HEART CATH AND CORONARY ANGIOGRAPHY N/A 08/14/2016   Procedure: Left Heart Cath and Coronary Angiography;  Surgeon: [name], MD;  Location: [place] CV LAB;  Service: Cardiovascular;  Laterality: N/A;  . PRESSURE SENSOR/CARDIOMEMS N/A 09/23/2019   Procedure: PRESSURE SENSOR/CARDIOMEMS;  Surgeon: Jolaine Artist, MD;  Location: [place] CV LAB;  Service: Cardiovascular;  Laterality: N/A;  . RIGHT HEART CATH N/A 09/23/2019   Procedure: RIGHT HEART CATH;  Surgeon: Jolaine Artist, MD;   Location: [place] CV LAB;  Service: Cardiovascular;  Laterality: N/A;  . RIGHT/LEFT HEART CATH AND CORONARY ANGIOGRAPHY N/A 09/01/2018   Procedure: RIGHT/LEFT HEART CATH AND CORONARY ANGIOGRAPHY;  Surgeon: [name], MD;  Location: [place] CV LAB;  Service: Cardiovascular;  Laterality: N/A;  . RIGHT/LEFT HEART CATH AND CORONARY ANGIOGRAPHY N/A 04/13/2019   Procedure: RIGHT/LEFT HEART CATH AND CORONARY ANGIOGRAPHY;  Surgeon: [name], MD;  Location: [place] CV LAB;  Service: Cardiovascular;  Laterality: N/A;  . TOE AMPUTATION Right    "big toe"    Current Meds  Medication Sig  . ACCU-CHEK AVIVA PLUS test strip Use to check blood sugar up to 2 times daily  . Accu-Chek Softclix Lancets lancets Use to check blood sugar up to 2 times daily.  [name] acetaminophen (TYLENOL) 500 MG tablet Take 1,000 mg by mouth every 6 (six) hours as needed for moderate pain or headache.   . Alcohol Swabs 70 % PADS Use when checking blood sugar up to 3x daily  . allopurinol (ZYLOPRIM) 100 MG tablet Take 1 tablet (100 mg total) by mouth daily.  [name] aspirin 81 MG chewable tablet Chew 81 mg by mouth daily.  . Blood Glucose Monitoring Suppl (ACCU-CHEK AVIVA PLUS) w/Device KIT Use to  check blood sugar up to 2 times daily  . bosutinib (BOSULIF) 100 MG tablet Take 100 mg by mouth at bedtime as needed (tired/not feeling well). Take with food.   . Dulaglutide (TRULICITY) 6.78 LF/8.1OF SOPN Inject 0.75 mg into the skin once a week. (Patient taking differently: Inject 0.75 mg into the skin every Saturday. )  . empagliflozin (JARDIANCE) 10 MG TABS tablet Take 10 mg by mouth daily before breakfast.  . gabapentin (NEURONTIN) 300 MG capsule TAKE TWO CAPSULES BY MOUTH TWICE DAILY. (MAX DOSE = '1200MG'$  DAILY FOR KIDNEY FUNCTION)  . hydrALAZINE (APRESOLINE) 25 MG tablet Take 1 tablet (25 mg total) by mouth 3 (three) times daily.  . Insulin Glargine ([name] KWIKPEN) 100 UNIT/ML SOPN Inject 38 Units into the skin  daily.   . Insulin Pen Needle 31G X 5 MM MISC 31 g by Does not apply route as directed.  . isosorbide mononitrate (IMDUR) 60 MG 24 hr tablet Take 1 tablet (60 mg total) by mouth daily.  [name] levothyroxine (SYNTHROID) 25 MCG tablet Take 1 tablet (25 mcg total) by mouth daily before breakfast.  . losartan (COZAAR) 25 MG tablet Take 1 tablet (25 mg total) by mouth daily.  . Menthol-Zinc Oxide (GOLD BOND EX) Apply 1 application topically daily as needed (muscle pain).  . metoprolol succinate (TOPROL-XL) 25 MG 24 hr tablet Take 1 tablet (25 mg total) by mouth daily.  . Multiple Vitamin (MULTIVITAMIN WITH MINERALS) TABS tablet Take 1 tablet by mouth daily.  . nitroGLYCERIN (NITROSTAT) 0.4 MG SL tablet Place 1 tablet (0.4 mg total) under the tongue every 5 (five) minutes x 3 doses as needed for chest pain.  [name] omeprazole (PRILOSEC) 20 MG capsule Take 20 mg by mouth in the morning and at bedtime. Morning & afternoon  . Polyethyl Glycol-Propyl Glycol (SYSTANE OP) Place 1 drop into both eyes 3 (three) times daily as needed (dry/irritated eyes.).   [name] polyethylene glycol (MIRALAX / GLYCOLAX) 17 g packet Take 17 g by mouth daily as needed for mild constipation.   . potassium chloride SA (KLOR-CON) 20 MEQ tablet Take 1 tablet (20 mEq total) by mouth 2 (two) times daily.  [name] PROVENTIL HFA 108 (90 Base) MCG/ACT inhaler Inhale 2 puffs into the lungs every 6 (six) hours as needed for wheezing or shortness of breath.  . ranolazine (RANEXA) 500 MG 12 hr tablet Take 1 tablet (500 mg total) by mouth 2 (two) times daily.  . rosuvastatin (CRESTOR) 5 MG tablet Take 1 tablet (5 mg total) by mouth daily. (Patient taking differently: Take 5 mg by mouth every evening. )  . torsemide (DEMADEX) 20 MG tablet Take 3 tablets (60 mg total) by mouth daily.  . traMADol (ULTRAM) 50 MG tablet Take 1-2 tablets (50-100 mg total) by mouth at bedtime as needed. (Patient taking differently: Take 50-100 mg by mouth at bedtime as needed (pain.). )   . warfarin (COUMADIN) 3 MG tablet Take 1 to 1 1/2 tablets by mouth daily as directed by the coumadin clinic (Patient taking differently: Take 3-4.5 mg by mouth See admin instructions. Take 1 tablet (3 mg) by mouth in the evenings on Sundays, Tuesdays, Wednesdays,Thursdays, & Saturdays. Take 1.5 tablets (4.5 mg) by mouth in the evenings on Mondays & Fridays.)  . [DISCONTINUED] isosorbide mononitrate (IMDUR) 60 MG 24 hr tablet Take 1.5 tablets (90 mg total) by mouth daily.    Allergies: Clopidogrel, Ciprofloxacin, Mirtazapine, and Spironolactone  Social History   Tobacco Use  . Smoking status: Former  Smoker    Packs/day: 1.75    Years: 20.00    Pack years: 35.00    Types: Cigarettes    Start date: 13    Quit date: 1975    Years since quitting: 46.6  . Smokeless tobacco: Former Network engineer  . Vaping Use: Never used  Substance Use Topics  . Alcohol use: No    Comment: previously drank heavily - quit 1979.  . Drug use: No    Family History  Problem Relation Age of Onset  . Brain cancer Father   . Diabetes Sister   . Heart attack Sister     Review of Systems: A 12-system review of systems was performed and was negative except as noted in the HPI.  --------------------------------------------------------------------------------------------------  Physical Exam: BP 122/80 (BP Location: Left Arm, Patient Position: Sitting, Cuff Size: Normal)   Pulse 72   Ht '5\' 4"'$  (1.626 m)   Wt 203 lb 12.8 oz (92.4 kg)   SpO2 95%   BMI 34.98 kg/m   General: NAD.  Accompanied by his wife. Neck: No JVD or HJR, though body habitus limits evaluation. Lungs: Coarse breath sounds bilaterally without wheezes or crackles.  Good air movement. Heart: Regular rate and rhythm without murmurs, rubs, or gallops. Abdomen: Soft, nontender, nondistended. Extremities: No lower extremity edema. Skin: Right groin catheterization site has healed.  There is no hematoma or discharge.  EKG: Normal  sinus rhythm with trifascicular block (first-degree AV block, right bundle branch block, and left anterior fascicular block; PR interval 216 ms).  PR interval has lengthened slightly from prior tracing on 10/01/2019.  Lab Results  Component Value Date   WBC 8.3 09/16/2019   HGB 12.2 (L) 09/23/2019   HGB 11.6 (L) 09/23/2019   HCT 36.0 (L) 09/23/2019   HCT 34.0 (L) 09/23/2019   MCV 88.9 09/16/2019   PLT 191 09/16/2019    Lab Results  Component Value Date   NA 142 09/23/2019   NA 146 (H) 09/23/2019   K 4.4 09/23/2019   K 4.0 09/23/2019   CL 101 09/16/2019   CO2 21 (L) 09/16/2019   BUN 17 09/16/2019   CREATININE 1.59 (H) 09/16/2019   GLUCOSE 196 (H) 09/16/2019   ALT 14 08/28/2018    Lab Results  Component Value Date   CHOL 110 08/28/2018   HDL 28 (L) 08/28/2018   LDLCALC 51 08/28/2018   TRIG 155 (H) 08/28/2018   CHOLHDL 3.9 08/28/2018    --------------------------------------------------------------------------------------------------  ASSESSMENT AND PLAN: Coronary artery disease with stable angina: No chest pain reported.  Chronic exertional dyspnea is stable to slightly improved.  Continue current medications for secondary prevention, including warfarin and aspirin status post PCI to the LAD earlier this year.  I will decrease isosorbide mononitrate from 90 mg daily to 60 mg daily due to lightheadedness/dizziness.  We may need to de-escalate his medications further if dizziness persists.  Continue ranolazine 500 mg twice daily for now.  Chronic HFrEF: Mr. Belt appears grossly euvolemic with NYHA class II-III symptoms.  [name] status post CardioMEMS placement with monitoring through the advanced heart failure clinic.  Continue current regimen of metoprolol, losartan, and torsemide.  Given slight PR prolongation today, low threshold for discontinuing metoprolol altogether if trifascicular block persists at follow-up.  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  PR interval today is slightly longer than on prior tracings at 216 ms.  We will plan to repeat EKG  when Mr. Minus is seen for follow-up in a month.  If PR interval remains prolonged, metoprolol will need to be discontinued.  It is foreseeable that Mr. Luber will progress to high-grade AV block at some point and may require pacemaker placement.  Paroxysmal atrial fibrillation: No evidence of atrial fibrillation recurrence.  Continue indefinite anticoagulation with warfarin.  We will continue low-dose metoprolol for now, though this may need to be discontinued in the future, as discussed above.  Hypertension: Blood pressure reasonably well controlled today.  Given dizziness, will decrease isosorbide mononitrate.  Hyperlipidemia: LDL at goal.  Continue rosuvastatin.  Follow-up: Return to clinic in 1 month.  [name], MD 10/20/2019 1:46 PM

## 2019-10-20 ENCOUNTER — Ambulatory Visit (INDEPENDENT_AMBULATORY_CARE_PROVIDER_SITE_OTHER): Payer: Medicare HMO | Admitting: Internal Medicine

## 2019-10-20 ENCOUNTER — Encounter: Payer: Self-pay | Admitting: Internal Medicine

## 2019-10-20 ENCOUNTER — Ambulatory Visit (INDEPENDENT_AMBULATORY_CARE_PROVIDER_SITE_OTHER): Payer: Medicare HMO

## 2019-10-20 ENCOUNTER — Other Ambulatory Visit: Payer: Self-pay

## 2019-10-20 VITALS — BP 122/80 | HR 72 | Ht 64.0 in | Wt 203.8 lb

## 2019-10-20 DIAGNOSIS — I25118 Atherosclerotic heart disease of native coronary artery with other forms of angina pectoris: Secondary | ICD-10-CM | POA: Diagnosis not present

## 2019-10-20 DIAGNOSIS — I480 Paroxysmal atrial fibrillation: Secondary | ICD-10-CM

## 2019-10-20 DIAGNOSIS — Z7901 Long term (current) use of anticoagulants: Secondary | ICD-10-CM

## 2019-10-20 DIAGNOSIS — I10 Essential (primary) hypertension: Secondary | ICD-10-CM | POA: Diagnosis not present

## 2019-10-20 DIAGNOSIS — E785 Hyperlipidemia, unspecified: Secondary | ICD-10-CM | POA: Diagnosis not present

## 2019-10-20 DIAGNOSIS — I453 Trifascicular block: Secondary | ICD-10-CM

## 2019-10-20 DIAGNOSIS — I5022 Chronic systolic (congestive) heart failure: Secondary | ICD-10-CM

## 2019-10-20 LAB — POCT INR: INR: 1.9 — AB (ref 2.0–3.0)

## 2019-10-20 MED ORDER — ISOSORBIDE MONONITRATE ER 60 MG PO TB24: 60.0000 mg | ORAL_TABLET | Freq: Every day | ORAL | 2 refills | Status: DC

## 2019-10-20 NOTE — Patient Instructions (Signed)
Medication Instructions:  Your physician has recommended you make the following change in your medication:  1- DECREASE Imdur to 60 mg (1 tablet) by mouth once a day.  *If you need a refill on your cardiac medications before your next appointment, please call your pharmacy*  Follow-Up: At [facility], you and your health needs are our priority.  As part of our continuing mission to provide you with exceptional heart care, we have created designated Provider Care Teams.  These Care Teams include your primary Cardiologist (physician) and Advanced Practice Providers (APPs -  Physician Assistants and Nurse Practitioners) who all work together to provide you with the care you need, when you need it.  We recommend signing up for the patient portal called "MyChart".  Sign up information is provided on this After Visit Summary.  MyChart is used to connect with patients for Virtual Visits (Telemedicine).  Patients are able to view lab/test results, encounter notes, upcoming appointments, etc.  Non-urgent messages can be sent to your provider as well.   To learn more about what you can do with MyChart, go to [email or web address].    Your next appointment:   1 month(s) with Dr End or APP on the same day as coumadin check   The format for your next appointment:   In Person  Provider:    You may see [name], MD or one of the following Advanced Practice Providers on your designated Care Team:    [name], NP  [name], PA-C  [name], PA-C

## 2019-10-20 NOTE — Patient Instructions (Addendum)
-   START NEW DOSAGE warfarin of 1 TABLET EVERY DAY EXCEPT 1.5 TABLETS ON MONDAYS, WEDNESDAYS & FRIDAYS. - recheck INR in 4 weeks.

## 2019-10-25 ENCOUNTER — Telehealth: Payer: Medicare HMO | Admitting: General Practice

## 2019-10-25 ENCOUNTER — Other Ambulatory Visit: Payer: Self-pay

## 2019-10-25 ENCOUNTER — Encounter: Payer: Medicare HMO | Admitting: *Deleted

## 2019-10-25 ENCOUNTER — Ambulatory Visit (INDEPENDENT_AMBULATORY_CARE_PROVIDER_SITE_OTHER): Payer: Medicare HMO | Admitting: General Practice

## 2019-10-25 DIAGNOSIS — R0602 Shortness of breath: Secondary | ICD-10-CM

## 2019-10-25 DIAGNOSIS — E119 Type 2 diabetes mellitus without complications: Secondary | ICD-10-CM | POA: Diagnosis not present

## 2019-10-25 DIAGNOSIS — I5022 Chronic systolic (congestive) heart failure: Secondary | ICD-10-CM

## 2019-10-25 DIAGNOSIS — I5042 Chronic combined systolic (congestive) and diastolic (congestive) heart failure: Secondary | ICD-10-CM

## 2019-10-25 DIAGNOSIS — Z794 Long term (current) use of insulin: Secondary | ICD-10-CM

## 2019-10-25 DIAGNOSIS — E785 Hyperlipidemia, unspecified: Secondary | ICD-10-CM

## 2019-10-25 DIAGNOSIS — Z7982 Long term (current) use of aspirin: Secondary | ICD-10-CM | POA: Diagnosis not present

## 2019-10-25 DIAGNOSIS — I5040 Unspecified combined systolic (congestive) and diastolic (congestive) heart failure: Secondary | ICD-10-CM | POA: Diagnosis present

## 2019-10-25 DIAGNOSIS — Z7901 Long term (current) use of anticoagulants: Secondary | ICD-10-CM | POA: Diagnosis not present

## 2019-10-25 DIAGNOSIS — Z87891 Personal history of nicotine dependence: Secondary | ICD-10-CM | POA: Diagnosis not present

## 2019-10-25 DIAGNOSIS — I10 Essential (primary) hypertension: Secondary | ICD-10-CM

## 2019-10-25 DIAGNOSIS — E1121 Type 2 diabetes mellitus with diabetic nephropathy: Secondary | ICD-10-CM | POA: Diagnosis not present

## 2019-10-25 DIAGNOSIS — I5032 Chronic diastolic (congestive) heart failure: Secondary | ICD-10-CM

## 2019-10-25 DIAGNOSIS — I480 Paroxysmal atrial fibrillation: Secondary | ICD-10-CM | POA: Diagnosis not present

## 2019-10-25 DIAGNOSIS — K219 Gastro-esophageal reflux disease without esophagitis: Secondary | ICD-10-CM | POA: Diagnosis not present

## 2019-10-25 DIAGNOSIS — E039 Hypothyroidism, unspecified: Secondary | ICD-10-CM | POA: Diagnosis not present

## 2019-10-25 DIAGNOSIS — N400 Enlarged prostate without lower urinary tract symptoms: Secondary | ICD-10-CM | POA: Diagnosis not present

## 2019-10-25 DIAGNOSIS — E1169 Type 2 diabetes mellitus with other specified complication: Secondary | ICD-10-CM | POA: Diagnosis not present

## 2019-10-25 DIAGNOSIS — Z79899 Other long term (current) drug therapy: Secondary | ICD-10-CM | POA: Diagnosis not present

## 2019-10-25 DIAGNOSIS — I25118 Atherosclerotic heart disease of native coronary artery with other forms of angina pectoris: Secondary | ICD-10-CM | POA: Diagnosis not present

## 2019-10-25 DIAGNOSIS — I110 Hypertensive heart disease with heart failure: Secondary | ICD-10-CM | POA: Diagnosis not present

## 2019-10-25 NOTE — Chronic Care Management (AMB) (Signed)
Chronic Care Management   Follow Up Note   10/25/2019 Name: Lucas Caldwell MRN: [id number] DOB: [birth year]  Referred by: [name], DO Reason for referral : Chronic Care Management (RNCM Follow up call: Chronic Disease Managment and Care Coordination Needs)   SAW MENDENHALL is a 86 y.o. year old male who is a primary care patient of [name], DO. The CCM team was consulted for assistance with chronic disease management and care coordination needs.    Review of patient status, including review of consultants reports, relevant laboratory and other test results, and collaboration with appropriate care team members and the patient's provider was performed as part of comprehensive patient evaluation and provision of chronic care management services.    SDOH (Social Determinants of Health) assessments performed: Yes See Care Plan activities for detailed interventions related to [facility])     Outpatient Encounter Medications as of 10/25/2019  Medication Sig  . ACCU-CHEK AVIVA PLUS test strip Use to check blood sugar up to 2 times daily  . Accu-Chek Softclix Lancets lancets Use to check blood sugar up to 2 times daily.  [name] acetaminophen (TYLENOL) 500 MG tablet Take 1,000 mg by mouth every 6 (six) hours as needed for moderate pain or headache.   . Alcohol Swabs 70 % PADS Use when checking blood sugar up to 3x daily  . allopurinol (ZYLOPRIM) 100 MG tablet Take 1 tablet (100 mg total) by mouth daily.  [name] aspirin 81 MG chewable tablet Chew 81 mg by mouth daily.  . Blood Glucose Monitoring Suppl (ACCU-CHEK AVIVA PLUS) w/Device KIT Use to check blood sugar up to 2 times daily  . bosutinib (BOSULIF) 100 MG tablet Take 100 mg by mouth at bedtime as needed (tired/not feeling well). Take with food.   . Dulaglutide (TRULICITY) 5.94 VO/5.9YT SOPN Inject 0.75 mg into the skin once a week. (Patient taking differently: Inject 0.75 mg into the skin every Saturday. )  . empagliflozin  (JARDIANCE) 10 MG TABS tablet Take 10 mg by mouth daily before breakfast.  . gabapentin (NEURONTIN) 300 MG capsule TAKE TWO CAPSULES BY MOUTH TWICE DAILY. (MAX DOSE = 1200MG DAILY FOR KIDNEY FUNCTION)  . hydrALAZINE (APRESOLINE) 25 MG tablet Take 1 tablet (25 mg total) by mouth 3 (three) times daily.  . Insulin Glargine ([name] KWIKPEN) 100 UNIT/ML SOPN Inject 38 Units into the skin daily.   . Insulin Pen Needle 31G X 5 MM MISC 31 g by Does not apply route as directed.  . isosorbide mononitrate (IMDUR) 60 MG 24 hr tablet Take 1 tablet (60 mg total) by mouth daily.  [name] levothyroxine (SYNTHROID) 25 MCG tablet Take 1 tablet (25 mcg total) by mouth daily before breakfast.  . losartan (COZAAR) 25 MG tablet Take 1 tablet (25 mg total) by mouth daily.  . Menthol-Zinc Oxide (GOLD BOND EX) Apply 1 application topically daily as needed (muscle pain).  . metoprolol succinate (TOPROL-XL) 25 MG 24 hr tablet Take 1 tablet (25 mg total) by mouth daily.  . Multiple Vitamin (MULTIVITAMIN WITH MINERALS) TABS tablet Take 1 tablet by mouth daily.  . nitroGLYCERIN (NITROSTAT) 0.4 MG SL tablet Place 1 tablet (0.4 mg total) under the tongue every 5 (five) minutes x 3 doses as needed for chest pain.  [name] omeprazole (PRILOSEC) 20 MG capsule Take 20 mg by mouth in the morning and at bedtime. Morning & afternoon  . Polyethyl Glycol-Propyl Glycol (SYSTANE OP) Place 1 drop into both eyes 3 (three) times daily as needed (  dry/irritated eyes.).   [name] polyethylene glycol (MIRALAX / GLYCOLAX) 17 g packet Take 17 g by mouth daily as needed for mild constipation.   . potassium chloride SA (KLOR-CON) 20 MEQ tablet Take 1 tablet (20 mEq total) by mouth 2 (two) times daily.  [name] PROVENTIL HFA 108 (90 Base) MCG/ACT inhaler Inhale 2 puffs into the lungs every 6 (six) hours as needed for wheezing or shortness of breath.  . ranolazine (RANEXA) 500 MG 12 hr tablet Take 1 tablet (500 mg total) by mouth 2 (two) times daily.  . rosuvastatin  (CRESTOR) 5 MG tablet Take 1 tablet (5 mg total) by mouth daily. (Patient taking differently: Take 5 mg by mouth every evening. )  . torsemide (DEMADEX) 20 MG tablet Take 3 tablets (60 mg total) by mouth daily.  . traMADol (ULTRAM) 50 MG tablet Take 1-2 tablets (50-100 mg total) by mouth at bedtime as needed. (Patient taking differently: Take 50-100 mg by mouth at bedtime as needed (pain.). )  . warfarin (COUMADIN) 3 MG tablet Take 1 to 1 1/2 tablets by mouth daily as directed by the coumadin clinic (Patient taking differently: Take 3-4.5 mg by mouth See admin instructions. Take 1 tablet (3 mg) by mouth in the evenings on Sundays, Tuesdays, Wednesdays,Thursdays, & Saturdays. Take 1.5 tablets (4.5 mg) by mouth in the evenings on Mondays & Fridays.)   No facility-administered encounter medications on file as of 10/25/2019.     Objective:   Goals Addressed              This Visit's Progress   .  RN- I am getting more short winded but I feel better now (pt-stated)        Current Barriers:  [name] Knowledge deficit related to basic heart failure pathophysiology and self care management   Case Manager Clinical Goal(s):  [name] Over the next 120 days, patient will verbalize understanding of Heart Failure Action Plan and when to call doctor . Over the next 120 days, patient will take all Heart Failure mediations as prescribed . Over the next 120 days, patient will weigh daily and record (notifying MD of 3 lb weight gain over night or 5 lb in a week) . Over the next 120 days, patient will take blood pressure daily and record, notifying the provider for blood pressure outside of set parameters  Interventions:  . Basic overview and discussion of pathophysiology of Heart Failure reviewed  . Provided verbal education on low sodium diet, per the patient wife he is doing better with dietary restrictions. The patients wife continues to monitor and help patient  . Discussed importance of daily weight and advised  patient to weigh and record daily: patient is now weighing daily and recording, weight on 8-4 was 203.  The patients wife was not at home and the patient states that she keeps up with all of that "stuff" for him.  . Reviewed role of diuretics in prevention of fluid overload and management of heart failure.  His diuretic regimen is working well for him at this time. . Discussed s/s of HF and increased fluid. Patient verbalized that he is feeling much better and went today to have a "test".  (ECHO- 04/22/2019 with an EF% of 30-35%) 10-25-2019: The patient states he is doing much better than last time he talked with the [facility]. He saw cardiologist last week for evaluation of his groin. Had previously had an infection at the procedure site and the patient verbalized the antibiotic healed it  right up. The patient verbalized he is feeling good and plans to start back with cardiac rehab today. Is excited about his upcoming birthday at the end of the month.  . Evaluation of oxygen needs: Patient reports using 02 at night successfully. The patient expressed he usually wakes up in the middle of the night and the oxygen has dried his nose out. He usually takes it off then and goes back to sleep without incident.  The patient does use oxygen during the day if he needs it. No acute distress noted. . Discussed the importance of [place] and the patient states PT and a nurse are coming into the home to check on him regularly. The patient feels he is improving. [name] ejection fraction: 30-35% 04-22-2019   BP Readings from Last 3 Encounters:  10/20/19 122/80  10/08/19 (!) 130/74  10/01/19 130/62     Wt Readings from Last 3 Encounters:  10/20/19 203 lb 12.8 oz (92.4 kg)  10/08/19 201 lb (91.2 kg)  10/01/19 201 lb 2 oz (91.2 kg)         Patient Self Care Activities:  . Takes Heart Failure Medications as prescribed . Weighs daily and record (notifying MD of 3 lb weight gain over night or 5 lb in a week) . Verbalizes understanding  of and follows CHF Action Plan . Adheres to low sodium diet  Please see past updates related to this goal by clicking on the "Past Updates" button in the selected goal      .  RNCM: We doing better at watching his blood pressure and sugars        Current Barriers:  . Chronic Disease Management support, education, and care coordination needs related to HTN, COPD, and DMII  Clinical Goal(s) related to  HTN, COPD, and DMII:  Over the next 120 days, patient will:  . Work with the care management team to address educational, disease management, and care coordination needs  . Begin or continue self health monitoring activities as directed today Measure and record cbg (blood glucose) 1 times daily, Measure and record blood pressure 5 times per week, Measure and record weight daily, and adhere to a heart healthy/ADA diet . Call provider office for new or worsened signs and symptoms Blood glucose findings outside established parameters, Blood pressure findings outside established parameters, Weight outside established parameters, Oxygen saturation lower than established parameter, Shortness of breath, and New or worsened symptom related to Chronic health conditions . Call care management team with questions or concerns . Verbalize basic understanding of patient centered plan of care established today  Interventions related to  HTN, COPD, and DMII: . Evaluation of current treatment plans and patient's adherence to plan as established by provider.  10-25-2019: the patient states he feels he is doing very well right now with his chronic health conditions. Denies any acute distress. Sees providers regularly.  . Assessed patient understanding of disease states- the patient verbalized he does well and his wife keeps up with all the things he needs. 10-25-2019: the patient states his wife was not available to give the [facility] updates but he is feeling great and denies any new concerns at this time.  . Assessed  patient's education and care coordination needs.  Ongoing support and education on maintaining a diet low in sodium and sugars.  . Provided disease specific education to patient. 10-25-2019: Reminded the patient of the CCM team available to assist as needed and to continue to follow up with providers on a  regular basis. Next appointment with the pcp on 12-01-2019. Nash Dimmer with appropriate clinical care team members regarding patient needs: CCM pharmacist available and working with the patient also.  . Evaluation of sx/sx of shortness of breath. The patient denies shortness of breath at this times. Does admit he has some when he is active. Feels he has improved overall. Is happy to resume cardiac rehab today.   Patient Self Care Activities related to CHF, HTN, DMII, and increased shortness of breath with recent hospitalization and ER visit:  . Patient is unable to independently self-manage chronic health conditions  Please see past updates related to this goal by clicking on the "Past Updates" button in the selected goal          Plan:   Telephone follow up appointment with care management team member scheduled for: 12-16-2019 at 0900 am.   Noreene Larsson RN, MSN, [place] Mobile: [phone]

## 2019-10-25 NOTE — Patient Instructions (Signed)
Visit Information  Goals Addressed              This Visit's Progress     RN- I am getting more short winded but I feel better now (pt-stated)        Current Barriers:   Knowledge deficit related to basic heart failure pathophysiology and self care management   Case Manager Clinical Goal(s):   Over the next 120 days, patient will verbalize understanding of Heart Failure Action Plan and when to call doctor  Over the next 120 days, patient will take all Heart Failure mediations as prescribed  Over the next 120 days, patient will weigh daily and record (notifying MD of 3 lb weight gain over night or 5 lb in a week)  Over the next 120 days, patient will take blood pressure daily and record, notifying the provider for blood pressure outside of set parameters  Interventions:   Basic overview and discussion of pathophysiology of Heart Failure reviewed   Provided verbal education on low sodium diet, per the patient wife he is doing better with dietary restrictions. The patients wife continues to monitor and help patient   Discussed importance of daily weight and advised patient to weigh and record daily: patient is now weighing daily and recording, weight on 8-4 was 203.  The patients wife was not at home and the patient states that she keeps up with all of that "stuff" for him.   Reviewed role of diuretics in prevention of fluid overload and management of heart failure.  His diuretic regimen is working well for him at this time.  Discussed s/s of HF and increased fluid. Patient verbalized that he is feeling much better and went today to have a "test".  (ECHO- 04/22/2019 with an EF% of 30-35%) 10-25-2019: The patient states he is doing much better than last time he talked with the [facility]. He saw cardiologist last week for evaluation of his groin. Had previously had an infection at the procedure site and the patient verbalized the antibiotic healed it right up. The patient verbalized he is  feeling good and plans to start back with cardiac rehab today. Is excited about his upcoming birthday at the end of the month.   Evaluation of oxygen needs: Patient reports using 02 at night successfully. The patient expressed he usually wakes up in the middle of the night and the oxygen has dried his nose out. He usually takes it off then and goes back to sleep without incident.  The patient does use oxygen during the day if he needs it. No acute distress noted.  Discussed the importance of [place] and the patient states PT and a nurse are coming into the home to check on him regularly. The patient feels he is improving.  ejection fraction: 30-35% 04-22-2019   BP Readings from Last 3 Encounters:  10/20/19 122/80  10/08/19 (!) 130/74  10/01/19 130/62     Wt Readings from Last 3 Encounters:  10/20/19 203 lb 12.8 oz (92.4 kg)  10/08/19 201 lb (91.2 kg)  10/01/19 201 lb 2 oz (91.2 kg)         Patient Self Care Activities:   Takes Heart Failure Medications as prescribed  Weighs daily and record (notifying MD of 3 lb weight gain over night or 5 lb in a week)  Verbalizes understanding of and follows CHF Action Plan  Adheres to low sodium diet  Please see past updates related to this goal by clicking on the "Past Updates"  button in the selected goal        RNCM: We doing better at watching his blood pressure and sugars        Current Barriers:   Chronic Disease Management support, education, and care coordination needs related to HTN, COPD, and DMII  Clinical Goal(s) related to  HTN, COPD, and DMII:  Over the next 120 days, patient will:   Work with the care management team to address educational, disease management, and care coordination needs   Begin or continue self health monitoring activities as directed today Measure and record cbg (blood glucose) 1 times daily, Measure and record blood pressure 5 times per week, Measure and record weight daily, and adhere to a heart healthy/ADA  diet  Call provider office for new or worsened signs and symptoms Blood glucose findings outside established parameters, Blood pressure findings outside established parameters, Weight outside established parameters, Oxygen saturation lower than established parameter, Shortness of breath, and New or worsened symptom related to Chronic health conditions  Call care management team with questions or concerns  Verbalize basic understanding of patient centered plan of care established today  Interventions related to  HTN, COPD, and DMII:  Evaluation of current treatment plans and patient's adherence to plan as established by provider.  10-25-2019: the patient states he feels he is doing very well right now with his chronic health conditions. Denies any acute distress. Sees providers regularly.   Assessed patient understanding of disease states- the patient verbalized he does well and his wife keeps up with all the things he needs. 10-25-2019: the patient states his wife was not available to give the [facility] updates but he is feeling great and denies any new concerns at this time.   Assessed patient's education and care coordination needs.  Ongoing support and education on maintaining a diet low in sodium and sugars.   Provided disease specific education to patient. 10-25-2019: Reminded the patient of the CCM team available to assist as needed and to continue to follow up with providers on a regular basis. Next appointment with the pcp on 12-01-2019.  Collaborated with appropriate clinical care team members regarding patient needs: CCM pharmacist available and working with the patient also.   Evaluation of sx/sx of shortness of breath. The patient denies shortness of breath at this times. Does admit he has some when he is active. Feels he has improved overall. Is happy to resume cardiac rehab today.   Patient Self Care Activities related to CHF, HTN, DMII, and increased shortness of breath with recent  hospitalization and ER visit:   Patient is unable to independently self-manage chronic health conditions  Please see past updates related to this goal by clicking on the "Past Updates" button in the selected goal         Patient verbalizes understanding of instructions provided today.   Telephone follow up appointment with care management team member scheduled for: 12-16-2019 at 0900 am  Noreene Larsson RN, MSN, [place] Mobile: [phone]

## 2019-10-25 NOTE — Progress Notes (Signed)
Daily Session Note  Patient Details  Name: Lucas Caldwell MRN: [id number] Date of Birth: 07/22/1933 Referring Provider:     Cardiac Rehab from 05/20/2019 in [facility]  Referring Provider Bensimhon      Encounter Date: 10/25/2019  Check In:  Session Check In - 10/25/19 1340      Check-In   Supervising physician immediately available to respond to emergencies See telemetry face sheet for immediately available ER MD    Location [facility]    Staff Present  , RN BSN;Jessica Hawkins, MA, RCEP, CCRP, CCET;Kara Langdon, MS Exercise Physiologist    Virtual Visit No    Medication changes reported     No    Fall or balance concerns reported    No    Warm-up and Cool-down Performed on first and last piece of equipment    Resistance Training Performed Yes    VAD Patient? No    PAD/SET Patient? No      Pain Assessment   Currently in Pain? No/denies              Social History   Tobacco Use  Smoking Status Former Smoker  . Packs/day: 1.75  . Years: 20.00  . Pack years: 35.00  . Types: Cigarettes  . Start date: 1955  . Quit date: 1975  . Years since quitting: 46.6  Smokeless Tobacco Former User    Goals Met:  Independence with exercise equipment Exercise tolerated well No report of cardiac concerns or symptoms Strength training completed today  Goals Unmet:  Not Applicable  Comments: Pt able to follow exercise prescription today without complaint.  Will continue to monitor for progression. Reviewed home exercise with pt today.  Pt plans to walk for exercise.  Reviewed THR, pulse, RPE, sign and symptoms, pulse oximetery and when to call 911 or MD.  Also discussed weather considerations and indoor options.  Pt voiced understanding.    Dr. Mark Miller is Medical Director for [facility] and [name]. 

## 2019-10-26 ENCOUNTER — Other Ambulatory Visit: Payer: Self-pay | Admitting: Internal Medicine

## 2019-10-26 NOTE — Telephone Encounter (Signed)
Please review for refill. Thanks!  

## 2019-10-27 ENCOUNTER — Other Ambulatory Visit: Payer: Self-pay

## 2019-10-27 ENCOUNTER — Encounter: Payer: Self-pay | Admitting: *Deleted

## 2019-10-27 ENCOUNTER — Ambulatory Visit: Payer: Medicare HMO | Admitting: Pharmacist

## 2019-10-27 ENCOUNTER — Encounter: Payer: Medicare HMO | Admitting: *Deleted

## 2019-10-27 DIAGNOSIS — I10 Essential (primary) hypertension: Secondary | ICD-10-CM | POA: Diagnosis not present

## 2019-10-27 DIAGNOSIS — I5022 Chronic systolic (congestive) heart failure: Secondary | ICD-10-CM

## 2019-10-27 DIAGNOSIS — I25118 Atherosclerotic heart disease of native coronary artery with other forms of angina pectoris: Secondary | ICD-10-CM

## 2019-10-27 DIAGNOSIS — I5032 Chronic diastolic (congestive) heart failure: Secondary | ICD-10-CM | POA: Diagnosis not present

## 2019-10-27 DIAGNOSIS — E1169 Type 2 diabetes mellitus with other specified complication: Secondary | ICD-10-CM | POA: Diagnosis not present

## 2019-10-27 DIAGNOSIS — Z7901 Long term (current) use of anticoagulants: Secondary | ICD-10-CM | POA: Diagnosis not present

## 2019-10-27 DIAGNOSIS — Z7982 Long term (current) use of aspirin: Secondary | ICD-10-CM | POA: Diagnosis not present

## 2019-10-27 DIAGNOSIS — I5042 Chronic combined systolic (congestive) and diastolic (congestive) heart failure: Secondary | ICD-10-CM

## 2019-10-27 DIAGNOSIS — Z794 Long term (current) use of insulin: Secondary | ICD-10-CM | POA: Diagnosis not present

## 2019-10-27 DIAGNOSIS — E785 Hyperlipidemia, unspecified: Secondary | ICD-10-CM | POA: Diagnosis not present

## 2019-10-27 DIAGNOSIS — E119 Type 2 diabetes mellitus without complications: Secondary | ICD-10-CM | POA: Diagnosis not present

## 2019-10-27 DIAGNOSIS — Z79899 Other long term (current) drug therapy: Secondary | ICD-10-CM | POA: Diagnosis not present

## 2019-10-27 DIAGNOSIS — Z87891 Personal history of nicotine dependence: Secondary | ICD-10-CM | POA: Diagnosis not present

## 2019-10-27 DIAGNOSIS — N400 Enlarged prostate without lower urinary tract symptoms: Secondary | ICD-10-CM | POA: Diagnosis not present

## 2019-10-27 DIAGNOSIS — I110 Hypertensive heart disease with heart failure: Secondary | ICD-10-CM | POA: Diagnosis not present

## 2019-10-27 DIAGNOSIS — E1121 Type 2 diabetes mellitus with diabetic nephropathy: Secondary | ICD-10-CM | POA: Diagnosis not present

## 2019-10-27 NOTE — Progress Notes (Signed)
Cardiac Individual Treatment Plan  Patient Details  Name: Lucas Caldwell MRN: [id number] Date of Birth: [birth year] Referring Provider:     Cardiac Rehab from 05/20/2019 in [facility] Cardiac and Pulmonary Rehab  Referring Provider Bensimhon      Initial Encounter Date:    Cardiac Rehab from 05/20/2019 in [facility] Cardiac and Pulmonary Rehab  Date 05/20/19      Visit Diagnosis: Chronic combined systolic and diastolic congestive heart failure ([place])  Patient's Home Medications on Admission:  Current Outpatient Medications:  .  ACCU-CHEK AVIVA PLUS test strip, Use to check blood sugar up to 2 times daily, Disp: 200 each, Rfl: 3 .  Accu-Chek Softclix Lancets lancets, Use to check blood sugar up to 2 times daily., Disp: 200 each, Rfl: 3 .  acetaminophen (TYLENOL) 500 MG tablet, Take 1,000 mg by mouth every 6 (six) hours as needed for moderate pain or headache. , Disp: , Rfl:  .  Alcohol Swabs 70 % PADS, Use when checking blood sugar up to 3x daily, Disp: 100 each, Rfl: 11 .  allopurinol (ZYLOPRIM) 100 MG tablet, Take 1 tablet (100 mg total) by mouth daily., Disp: 90 tablet, Rfl: 3 .  aspirin 81 MG chewable tablet, Chew 81 mg by mouth daily., Disp: , Rfl:  .  Blood Glucose Monitoring Suppl (ACCU-CHEK AVIVA PLUS) w/Device KIT, Use to check blood sugar up to 2 times daily, Disp: 1 kit, Rfl: 0 .  bosutinib (BOSULIF) 100 MG tablet, Take 100 mg by mouth at bedtime as needed (tired/not feeling well). Take with food. , Disp: , Rfl:  .  Dulaglutide (TRULICITY) 4.09 WJ/1.9JY SOPN, Inject 0.75 mg into the skin once a week. (Patient taking differently: Inject 0.75 mg into the skin every Saturday. ), Disp: 4 pen, Rfl: 0 .  empagliflozin (JARDIANCE) 10 MG TABS tablet, Take 10 mg by mouth daily before breakfast., Disp: 90 tablet, Rfl: 3 .  gabapentin (NEURONTIN) 300 MG capsule, TAKE TWO CAPSULES BY MOUTH TWICE DAILY. (MAX DOSE = 1200MG DAILY FOR KIDNEY FUNCTION), Disp: 360 capsule, Rfl: 1 .  hydrALAZINE (APRESOLINE)  25 MG tablet, Take 1 tablet (25 mg total) by mouth 3 (three) times daily., Disp: 270 tablet, Rfl: 3 .  Insulin Glargine ([name] KWIKPEN) 100 UNIT/ML SOPN, Inject 38 Units into the skin daily. , Disp: , Rfl:  .  Insulin Pen Needle 31G X 5 MM MISC, 31 g by Does not apply route as directed., Disp: 100 each, Rfl: 5 .  isosorbide mononitrate (IMDUR) 60 MG 24 hr tablet, Take 1 tablet (60 mg total) by mouth daily., Disp: 135 tablet, Rfl: 2 .  levothyroxine (SYNTHROID) 25 MCG tablet, Take 1 tablet (25 mcg total) by mouth daily before breakfast., Disp: 90 tablet, Rfl: 1 .  losartan (COZAAR) 25 MG tablet, Take 1 tablet (25 mg total) by mouth daily., Disp: 90 tablet, Rfl: 1 .  Menthol-Zinc Oxide (GOLD BOND EX), Apply 1 application topically daily as needed (muscle pain)., Disp: , Rfl:  .  metoprolol succinate (TOPROL-XL) 25 MG 24 hr tablet, Take 1 tablet (25 mg total) by mouth daily., Disp: 90 tablet, Rfl: 1 .  Multiple Vitamin (MULTIVITAMIN WITH MINERALS) TABS tablet, Take 1 tablet by mouth daily., Disp: , Rfl:  .  nitroGLYCERIN (NITROSTAT) 0.4 MG SL tablet, Place 1 tablet (0.4 mg total) under the tongue every 5 (five) minutes x 3 doses as needed for chest pain., Disp: 25 tablet, Rfl: 4 .  omeprazole (PRILOSEC) 20 MG capsule, Take 20 mg by mouth  in the morning and at bedtime. Morning & afternoon, Disp: , Rfl:  .  Polyethyl Glycol-Propyl Glycol (SYSTANE OP), Place 1 drop into both eyes 3 (three) times daily as needed (dry/irritated eyes.). , Disp: , Rfl:  .  polyethylene glycol (MIRALAX / GLYCOLAX) 17 g packet, Take 17 g by mouth daily as needed for mild constipation. , Disp: , Rfl:  .  potassium chloride SA (KLOR-CON) 20 MEQ tablet, Take 1 tablet (20 mEq total) by mouth 2 (two) times daily., Disp: 180 tablet, Rfl: 1 .  PROVENTIL HFA 108 (90 Base) MCG/ACT inhaler, Inhale 2 puffs into the lungs every 6 (six) hours as needed for wheezing or shortness of breath., Disp: , Rfl:  .  ranolazine (RANEXA) 500 MG 12 hr  tablet, Take 1 tablet (500 mg total) by mouth 2 (two) times daily., Disp: 180 tablet, Rfl: 2 .  rosuvastatin (CRESTOR) 5 MG tablet, Take 1 tablet (5 mg total) by mouth daily. (Patient taking differently: Take 5 mg by mouth every evening. ), Disp: 90 tablet, Rfl: 3 .  torsemide (DEMADEX) 20 MG tablet, Take 3 tablets (60 mg total) by mouth daily., Disp: 180 tablet, Rfl: 2 .  traMADol (ULTRAM) 50 MG tablet, Take 1-2 tablets (50-100 mg total) by mouth at bedtime as needed. (Patient taking differently: Take 50-100 mg by mouth at bedtime as needed (pain.). ), Disp: 30 tablet, Rfl: 2 .  warfarin (COUMADIN) 3 MG tablet, TAKE 1 TO 1 & 1/2 TABLETS ONCE A DAY AS DIRECTED BY THE COUMADIN CLINIC, Disp: 120 tablet, Rfl: 0  Past Medical History: Past Medical History:  Diagnosis Date  . Arthritis    "probably in his legs" (08/15/2016)  . Blind right eye   . BPH (benign prostatic hyperplasia)   . Breast asymmetry    Left breast is larger, present for several years.  [name] CAD (coronary artery disease)    a. Cath in the late 90's - reportedly ok;  b. 2014 s/p stenting x 2 @ [facility]; c 08/19/16 Cath/PCI with DES -> RCA, plan to treat LM 70% medically. Seen by surgery and felt to be too high risk for CABG; d. 08/2018 Cath: LM 70 (iFR 0.86), LAD 20ost, 40p, 50/57m D1 70, LCX patent stent, OM1 30, RCA patent stent, 44mSR, 40d, RPDA 30. PCWP 8. CO/CI 4.0/2.1; e. 03/2019 PCI to LAD (2.5x15 Resolute Onyx DES).  . Chronic combined systolic (congestive) and diastolic (congestive) heart failure ([place]   a. Previously reduced EF-->50% by echo in 2012;  b. 06/2015 Echo: EF 50-55%  c. 07/2016 Echo: EF 45-50%; d. 12/2017 Echo: EF 55%, Gr1 DD, mild MR; e. 08/2018 Echo: EF 35-40%, mildly dil PA; f. 04/2019 Echo: EF 30-35%, glob HK, nl RV fxn; g. s/p Cardiomems.  . Chronic kidney disease (CKD), stage IV (severe) ([place]  . CML (chronic myelocytic leukemia) ([facility]  . GERD (gastroesophageal reflux disease)   . GIB (gastrointestinal bleeding)     a. 12/2017 3 unit PRBC GIB in setting of coumadin-->Endo/colon multiple duodenal ulcers and a single bleeding ulcer in the proximal ascending colon status post hemostatic clipping x2.  . Gout   . Hyperlipemia   . Hypertension   . Hypothyroidism   . Iron deficiency anemia   . Ischemic cardiomyopathy    a. Previously reduced EF-->50% by echo in 2012;  b. 06/2015 Echo: EF 50-55%, Gr2 DD, mild MR, mildly dil LA, Ao sclerosis, mild TR;  c. 07/2016 Echo: EF 45-50%; d. 12/2017 Echo: EF  55%, Gr1 DD, mild MR; e. 08/2018 Echo: EF 35-40%; f. 04/2019 Echo: EF 30-35%.  . Migraine    "in the 1960s" (08/15/2016)  . PAF (paroxysmal atrial fibrillation) (HCC)    a. ? Dx 2014-->s/p DCCV;  b. CHA2DS2VASc = 6--> Coumadin.  . Prostate cancer ([place])   . RBBB   . Type II diabetes mellitus (HCC)     Tobacco Use: Social History   Tobacco Use  Smoking Status Former Smoker  . Packs/day: 1.75  . Years: 20.00  . Pack years: 35.00  . Types: Cigarettes  . Start date: 29  . Quit date: 63  . 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   Exercise Prescription Goal: Initial exercise prescription builds to 30-45 minutes a day of aerobic activity, 2-3 days per week.  Home exercise guidelines will be given to patient during program as part of exercise  prescription that the participant will acknowledge.   Education: Aerobic Exercise & Resistance Training: - Gives group verbal and written instruction on the various components of exercise. Focuses on aerobic and resistive training programs and the benefits of this training and how to safely progress through these programs..   Education: Exercise & Equipment Safety: - Individual verbal instruction and demonstration of equipment use and safety with use of the equipment.   Cardiac Rehab from 05/20/2019 in [facility] Cardiac and Pulmonary Rehab  Date 05/20/19  Educator AS  Instruction Review Code 1- Verbalizes Understanding      Education: Exercise Physiology & General Exercise Guidelines: - Group verbal and written instruction with models to review the exercise physiology of the cardiovascular system and associated critical values. Provides general exercise guidelines with specific guidelines to those with heart or lung disease.    Education: Flexibility, Balance, Mind/Body Relaxation: Provides group verbal/written instruction on the benefits of flexibility and balance training, including mind/body exercise modes such as yoga, pilates and tai chi.  Demonstration and skill practice provided.   Activity Barriers & Risk Stratification:  Activity Barriers & Cardiac Risk Stratification - 05/19/19 0913      Activity Barriers & Cardiac Risk Stratification   Activity Barriers Arthritis;Joint Problems;Deconditioning;Muscular Weakness;Shortness of Breath;Assistive Device   knee pain, uses walker   Cardiac Risk Stratification High           6 Minute Walk:  6 Minute Walk    Row Name 05/20/19 1539         6 Minute Walk   Distance 715 feet     Walk Time 5.35 minutes     # of Rest Breaks 1     MPH 1.54  METS 1.14     RPE 15     Perceived Dyspnea  2     VO2 Peak 4     Symptoms Yes (comment)     Comments legs "going to sleep" from knees down     Resting HR 95 bpm     Resting BP 114/70       Resting Oxygen Saturation  98 %     Exercise Oxygen Saturation  during 6 min walk 92 %     Max Ex. HR 107 bpm     Max Ex. BP 154/68     2 Minute Post BP 124/68            Oxygen Initial Assessment:   Oxygen Re-Evaluation:   Oxygen Discharge (Final Oxygen Re-Evaluation):   Initial Exercise Prescription:  Initial Exercise Prescription - 05/20/19 1500      Date of Initial Exercise RX and Referring Provider   Date 05/20/19    Referring Provider Bensimhon      Treadmill   MPH 1    Grade 0    Minutes 15      REL-XR   Level 1    Speed 50    Minutes 15    METs 1      Prescription Details   Frequency (times per week) 3    Duration Progress to 30 minutes of continuous aerobic without signs/symptoms of physical distress      Intensity   THRR 40-80% of Max Heartrate 111-128    Ratings of Perceived Exertion 11-13    Perceived Dyspnea 0-4      Resistance Training   Training Prescription Yes    Weight 3 lb    Reps 10-15           Perform Capillary Blood Glucose checks as needed.  Exercise Prescription Changes:  Exercise Prescription Changes    Row Name 05/20/19 1500 06/09/19 1100 06/22/19 1500 07/07/19 1100 07/20/19 1500     Response to Exercise   Blood Pressure (Admit) 114/70 110/70 140/82 130/64 126/64   Blood Pressure (Exercise) 154/68 120/80 130/70 140/72 128/74   Blood Pressure (Exit) 124/68 122/82 124/58 124/62 114/68   Heart Rate (Admit) 95 bpm 56 bpm 87 bpm 95 bpm 94 bpm   Heart Rate (Exercise) 107 bpm 126 bpm 116 bpm 127 bpm 116 bpm   Heart Rate (Exit) 90 bpm 109 bpm 95 bpm 111 bpm 97 bpm   Oxygen Saturation (Admit) 98 % -- -- -- --   Oxygen Saturation (Exercise) 92 % -- -- -- --   Oxygen Saturation (Exit) 96 % -- -- -- --   Rating of Perceived Exertion (Exercise) _0 Perceived Dyspnea (Exercise) 2 -- -- -- --   Symptoms legs "going to sleep" none none none none   Comments L shoulder old injury second full day of exericse -- -- --    Duration -- Progress to 30 minutes of  aerobic without signs/symptoms of physical distress Progress to 30 minutes of  aerobic without signs/symptoms of physical distress Progress to 30 minutes of  aerobic without signs/symptoms of physical distress Progress to 30 minutes of  aerobic without signs/symptoms of physical distress   Intensity -- THRR unchanged THRR unchanged THRR unchanged THRR unchanged     Progression   Progression -- Continue to progress workloads to maintain intensity without signs/symptoms of physical distress. Continue to progress workloads to maintain intensity without signs/symptoms of physical distress. Continue to progress workloads  to maintain intensity without signs/symptoms of physical distress. Continue to progress workloads to maintain intensity without signs/symptoms of physical distress.   Average METs -- 2 1.3 2.49 2.17     Resistance Training   Training Prescription -- Yes Yes Yes Yes   Weight -- 3 lb  3 lb  3 lb 4 lb   Reps -- 10-15 10-15 10-15 10-15     Interval Training   Interval Training -- No No No No     Treadmill   MPH -- 1.3 -- 1.4 1.4   Grade -- 0 -- 0.5 0.5   Minutes -- 15 -- 15 15   METs -- 2 -- 2.17 2.17     REL-XR   Level -- _0 Speed -- -- 50 -- 50   Minutes -- _1 METs -- 2 1.3 2.2 --     T5 Nustep   Level -- -- -- 2 --   Minutes -- -- -- 15 --   METs -- -- -- 3.1 --   Row Name 08/04/19 0800 08/18/19 1300 09/14/19 1400 09/27/19 1400 10/13/19 1200     Response to Exercise   Blood Pressure (Admit) 122/80 132/66 132/74 126/80 138/82   Blood Pressure (Exercise) 140/64 146/70 136/70 148/80 150/58   Blood Pressure (Exit) 122/72 140/76 124/66 116/74 122/76   Heart Rate (Admit) 94 bpm 111 bpm 98 bpm 84 bpm 86 bpm   Heart Rate (Exercise) 114 bpm 119 bpm 118 bpm 109 bpm 120 bpm   Heart Rate (Exit) 99 bpm 95 bpm 106 bpm 95 bpm 113 bpm   Rating of Perceived Exertion (Exercise) _2 Symptoms none none none none  none   Duration Continue with 30 min of aerobic exercise without signs/symptoms of physical distress. Continue with 30 min of aerobic exercise without signs/symptoms of physical distress. Progress to 30 minutes of  aerobic without signs/symptoms of physical distress Continue with 30 min of aerobic exercise without signs/symptoms of physical distress. Continue with 30 min of aerobic exercise without signs/symptoms of physical distress.   Intensity _3      Progression   Progression Continue to progress workloads to maintain intensity without signs/symptoms of physical distress. Continue to progress workloads to maintain intensity without signs/symptoms of physical distress. Continue to progress workloads to maintain intensity without signs/symptoms of physical distress. Continue to progress workloads to maintain intensity without signs/symptoms of physical distress. Continue to progress workloads to maintain intensity without signs/symptoms of physical distress.   Average METs 2.1 2.3 2.53 2.6 2.15     Resistance Training   Training Prescription Yes Yes Yes Yes --   Weight 4 lb 4 lb 4 lb 3 lb --   Reps 10-15 10-15 10-15 10-15 --     Interval Training   Interval Training No No No No --     Treadmill   MPH -- 1.6 1.7 -- --   Grade -- 0.5 0.5 -- --   Minutes -- 15 15 -- --   METs -- 2.17 2.42 -- --     Recumbant Bike   Level -- -- 1 3 --   Watts -- -- 25 20 --   Minutes -- -- 15 15 --   METs -- -- -- 2.9 --     NuStep   Level _4 --   SPM -- 80 -- -- --  Minutes _0 --   METs 2.1 2.4 2.8 2.5 --     T5 Nustep   Level -- -- 3 -- --   Minutes -- -- 15 -- --   METs -- -- 2 -- --   Row Name 10/25/19 1700             Home Exercise Plan   Plans to continue exercise at Home (comment)  walking       Frequency Add 2 additional days to program exercise sessions.       Initial Home Exercises Provided  10/25/19              Exercise Comments:  Exercise Comments    Row Name 06/02/19 1353           Exercise Comments First full day of exercise!  Patient was oriented to gym and equipment including functions, settings, policies, and procedures.  Patient's individual exercise prescription and treatment plan were reviewed.  All starting workloads were established based on the results of the 6 minute walk test done at initial orientation visit.  The plan for exercise progression was also introduced and progression will be customized based on patient's performance and goals.              Exercise Goals and Review:  Exercise Goals    Row Name 05/20/19 1544             Exercise Goals   Increase Physical Activity Yes       Intervention Provide advice, education, support and counseling about physical activity/exercise needs.;Develop an individualized exercise prescription for aerobic and resistive training based on initial evaluation findings, risk stratification, comorbidities and participant's personal goals.       Expected Outcomes Short Term: Attend rehab on a regular basis to increase amount of physical activity.;Long Term: Add in home exercise to make exercise part of routine and to increase amount of physical activity.;Long Term: Exercising regularly at least 3-5 days a week.       Increase Strength and Stamina Yes       Intervention Provide advice, education, support and counseling about physical activity/exercise needs.;Develop an individualized exercise prescription for aerobic and resistive training based on initial evaluation findings, risk stratification, comorbidities and participant's personal goals.       Expected Outcomes Short Term: Increase workloads from initial exercise prescription for resistance, speed, and METs.;Short Term: Perform resistance training exercises routinely during rehab and add in resistance training at home;Long Term: Improve cardiorespiratory fitness,  muscular endurance and strength as measured by increased METs and functional capacity (6MWT)       Able to understand and use rate of perceived exertion (RPE) scale Yes       Intervention Provide education and explanation on how to use RPE scale       Expected Outcomes Short Term: Able to use RPE daily in rehab to express subjective intensity level;Long Term:  Able to use RPE to guide intensity level when exercising independently       Able to understand and use Dyspnea scale Yes       Intervention Provide education and explanation on how to use Dyspnea scale       Expected Outcomes Short Term: Able to use Dyspnea scale daily in rehab to express subjective sense of shortness of breath during exertion;Long Term: Able to use Dyspnea scale to guide intensity level when exercising independently       Knowledge and understanding of Target  Heart Rate Range (THRR) Yes       Intervention Provide education and explanation of THRR including how the numbers were predicted and where they are located for reference       Expected Outcomes Short Term: Able to state/look up THRR;Short Term: Able to use daily as guideline for intensity in rehab;Long Term: Able to use THRR to govern intensity when exercising independently       Able to check pulse independently Yes       Intervention Provide education and demonstration on how to check pulse in carotid and radial arteries.;Review the importance of being able to check your own pulse for safety during independent exercise       Expected Outcomes Short Term: Able to explain why pulse checking is important during independent exercise;Long Term: Able to check pulse independently and accurately       Understanding of Exercise Prescription Yes       Intervention Provide education, explanation, and written materials on patient's individual exercise prescription       Expected Outcomes Short Term: Able to explain program exercise prescription;Long Term: Able to explain home  exercise prescription to exercise independently              Exercise Goals Re-Evaluation :  Exercise Goals Re-Evaluation    Row Name 06/02/19 1353 06/09/19 1109 06/22/19 1553 06/30/19 1343 07/07/19 1131     Exercise Goal Re-Evaluation   Exercise Goals Review Able to understand and use rate of perceived exertion (RPE) scale;Understanding of Exercise Prescription;Knowledge and understanding of Target Heart Rate Range (THRR) Increase Physical Activity;Increase Strength and Stamina;Understanding of Exercise Prescription Increase Physical Activity;Increase Strength and Stamina;Able to understand and use rate of perceived exertion (RPE) scale;Able to understand and use Dyspnea scale;Knowledge and understanding of Target Heart Rate Range (THRR);Able to check pulse independently;Understanding of Exercise Prescription Increase Physical Activity;Increase Strength and Stamina;Able to understand and use rate of perceived exertion (RPE) scale;Able to understand and use Dyspnea scale;Knowledge and understanding of Target Heart Rate Range (THRR);Able to check pulse independently;Understanding of Exercise Prescription Increase Physical Activity;Increase Strength and Stamina;Understanding of Exercise Prescription   Comments Reviewed RPE scale, THR and program prescription with pt today.  Pt voiced understanding and was given a copy of goals to take home. Adien is off to a good start in rehab.  He has completed his first two full days of exericse.  [name] now up to 1.3 mph on the treadmill.  We will continue to monitor his progress. Kolin only did the XR today.  He did not feel up to the TM.  Staff will monitor progress Denim is now on the treadmill again. Karge reports walking at home for exercise; 1 mile 3x/week (RPE12). Chima is doing well in rehab.  [name] now up to 3.1 METs on the NuStep.  We will continue to monitor his progress.   Expected Outcomes Short: Use RPE daily to regulate intensity. Long: Follow  program prescription in THR. Short: Continue to attend rehab regularly  Long: Continue to follow program prescription. Short: attend consistently Long: improve overall stamina Short: attend consistently, add some resistence at home Long: improve overall stamina Short: Continue to improve walking on treadmill  Long; Continue to improve stamina.   [place] Name 07/20/19 1536 08/02/19 1542 08/18/19 1316 09/14/19 1404 09/27/19 1411     Exercise Goal Re-Evaluation   Exercise Goals Review Increase Physical Activity;Increase Strength and Stamina;Able to understand and use rate of perceived exertion (RPE) scale;Able to understand and  use Dyspnea scale;Knowledge and understanding of Target Heart Rate Range (THRR);Able to check pulse independently;Understanding of Exercise Prescription Increase Physical Activity;Increase Strength and Stamina;Understanding of Exercise Prescription Increase Physical Activity;Increase Strength and Stamina;Understanding of Exercise Prescription Increase Physical Activity;Increase Strength and Stamina;Understanding of Exercise Prescription Increase Physical Activity;Increase Strength and Stamina;Understanding of Exercise Prescription   Comments Adonus works in [facility] range when he attends.  he has increased to 4 lb weights for strength work Gwyndolyn Saxon returned to rehab today after missing since 4/28.  He had been out after debriment of a diabetic ulcer.  He did well today on the equipment and particiapated in weights as well.  We will continue to monitor his progress. Ethelene Browns has progressed well since returning.  He has increased speed to 1.6 on TM. Staff will monitor progress. Ermal is doing well in rehab. [name] continuing to improve his exercise levels gradually. [name] at 1.7 on the TM. Will continue to monitor. Only attended once since last review.  He had a cath last week, waiting for clearance to return (note sent to MD).  He would do better with improved attendance.  We will continue to monitor  his progress.  Also saw in chart that he wishes to discontinue his home oxygen therapy.   Expected Outcomes Short: continue to attend consistently Long:  improve stamina and MET level Short: Return to improved attendance Long: Continue to improve stamina. Short: attend class consistently Long: increase stamina Short: Maintain compliance with rehab classes Long: Improve MET level and strength and endurance Short: Regularly attendance in rehab  Long: Continue to improve stamina.   [place] Name 10/13/19 1257 10/25/19 1342           Exercise Goal Re-Evaluation   Exercise Goals Review Increase Physical Activity;Increase Strength and Stamina;Understanding of Exercise Prescription Increase Physical Activity;Increase Strength and Stamina;Understanding of Exercise Prescription      Comments Today was patients first day back after extened absence.  Staff will monitor progress. Michail has been out for some time. Home exercise was reviewed with patient and was explained with reasons why and the benefits for continuous cardiovascular exercising at home. Patient stated he is able to walk at home. Reviewed home exercise with pt today.  Pt plans to walk for exercise.  Reviewed THR, pulse, RPE, sign and symptoms, pulse oximetery and when to call 911 or MD.  Also discussed weather considerations and indoor options.  Pt voiced understanding.      Expected Outcomes Short: get back to regular attendance Long: improve strength and stamina Short: Add in extra days at home for walking for exercise Long: Learn to exercise independently using RPE and THR at home             Discharge Exercise Prescription (Final Exercise Prescription Changes):  Exercise Prescription Changes - 10/25/19 1700      Home Exercise Plan   Plans to continue exercise at Home (comment)   walking   Frequency Add 2 additional days to program exercise sessions.    Initial Home Exercises Provided 10/25/19           Nutrition:  Target Goals:  Understanding of nutrition guidelines, daily intake of sodium <1557m, cholesterol <2023m calories 30% from fat and 7% or less from saturated fats, daily to have 5 or more servings of fruits and vegetables.  Education: Controlling Sodium/Reading Food Labels -Group verbal and written material supporting the discussion of sodium use in heart healthy nutrition. Review and explanation with models, verbal and written materials  for utilization of the food label.   Education: General Nutrition Guidelines/Fats and Fiber: -Group instruction provided by verbal, written material, models and posters to present the general guidelines for heart healthy nutrition. Gives an explanation and review of dietary fats and fiber.   Biometrics:  Pre Biometrics - 05/20/19 1545      Pre Biometrics   Height _0  (1.702 m)    Weight 206 lb (93.4 kg)    BMI (Calculated) 32.26            Nutrition Therapy Plan and Nutrition Goals:  Nutrition Therapy & Goals - 05/24/19 0904      Nutrition Therapy   Diet HH, Low Na, DM    Drug/Food Interactions Coumadin/Vit K    Protein (specify units) 80-85g    Fiber 30 grams    Whole Grain Foods 3 servings    Saturated Fats 12 max. grams    Fruits and Vegetables 5 servings/day    Sodium 1.5 grams      Personal Nutrition Goals   Nutrition Goal ST: have one protein food at each meal. Small frequent meals. LT: improving SOB and DOE. 4/10    Comments Pt reports needing help. SOB with eating. Not much appetite. eggs and grits in am. turnip greens and green beans. Canned peaches. Discussed protein sources with wife and pt. Discussed how right now the focus is on protein and calorie needs as pt is not eating due to SOB, but discussed some HH and DM options as well. Wife goignt o try BBG Chicken. Pt reports not eating many foods high in protein. Fried chicken legs last night with some peaches, BG this morning was 188, usually around 130-150. BG stable throught the day. Discussed  HH, DM friendly eating. Discussed protein and calorie needs and how to meet them with SOB such as small frequent meals. Hard for pt to hear, will talk more during class time in person.      Intervention Plan   Intervention Prescribe, educate and counsel regarding individualized specific dietary modifications aiming towards targeted core components such as weight, hypertension, lipid management, diabetes, heart failure and other comorbidities.;Nutrition handout(s) given to patient.    Expected Outcomes Short Term Goal: Understand basic principles of dietary content, such as calories, fat, sodium, cholesterol and nutrients.;Short Term Goal: A plan has been developed with personal nutrition goals set during dietitian appointment.;Long Term Goal: Adherence to prescribed nutrition plan.           Nutrition Assessments:  Nutrition Assessments - 05/20/19 1652      MEDFICTS Scores   Pre Score 46           MEDIFICTS Score Key:          ?70 Need to make dietary changes          40-70 Heart Healthy Diet         ? 40 Therapeutic Level Cholesterol Diet  Nutrition Goals Re-Evaluation:  Nutrition Goals Re-Evaluation    [place] Name 06/10/19 1348 08/18/19 1409 09/09/19 1400         Goals   Current Weight -- -- 204 lb (92.5 kg)     Nutrition Goal ST: have one protein food at each meal. Small frequent meals. LT: improving SOB and DOE. 4/10 -- Have more of an appetite     Comment Pt's wife [name] will make all of his meals for him. Wife reports making a protein  food at each meal, mostly chicken. clarified what foods are  good sources of protein, wife voiced understanding. Reiterated importance of getting enough protein intake to pt and small frequent meals for his SOB. Pt reports still having SOB. Pt and wife did not recall initial RD consult, will continue to follow up. Zeph has been having more slamon and fish for protein. He states that when he is hungry that he is eating hot dogs or a hamburger.  Informed him that he needs to work on his diet and focus on eating healthier foods that can help with his appetite.     Expected Outcome ST: have one protein food at each meal. Small frequent meals. LT: improving SOB and DOE. 4/10 Short: continue having quality lean protein Long:  maintain heart healthy eating Short: eat more  heart healthy foods and watch sodium intake. Long: maintain a healthy amount of sodium intake independently            Nutrition Goals Discharge (Final Nutrition Goals Re-Evaluation):  Nutrition Goals Re-Evaluation - 09/09/19 1400      Goals   Current Weight 204 lb (92.5 kg)    Nutrition Goal Have more of an appetite    Comment He states that when he is hungry that he is eating hot dogs or a hamburger. Informed him that he needs to work on his diet and focus on eating healthier foods that can help with his appetite.    Expected Outcome Short: eat more  heart healthy foods and watch sodium intake. Long: maintain a healthy amount of sodium intake independently           Psychosocial: Target Goals: Acknowledge presence or absence of significant depression and/or stress, maximize coping skills, provide positive support system. Participant is able to verbalize types and ability to use techniques and skills needed for reducing stress and depression.   Education: Depression - Provides group verbal and written instruction on the correlation between heart/lung disease and depressed mood, treatment options, and the stigmas associated with seeking treatment.   Education: Sleep Hygiene -Provides group verbal and written instruction about how sleep can affect your health.  Define sleep hygiene, discuss sleep cycles and impact of sleep habits. Review good sleep hygiene tips.     Education: Stress and Anxiety: - Provides group verbal and written instruction about the health risks of elevated stress and causes of high stress.  Discuss the correlation between heart/lung disease  and anxiety and treatment options. Review healthy ways to manage with stress and anxiety.    Initial Review & Psychosocial Screening:  Initial Psych Review & Screening - 05/19/19 0914      Initial Review   Current issues with Current Stress Concerns    Source of Stress Concerns Chronic Illness;Unable to participate in former interests or hobbies;Unable to perform yard/household activities    Comments Wants more energy to do more, less SOB, wearing oxygen and using cane      Family Dynamics   Good Support System? Yes   wife, son, daughter     Barriers   Psychosocial barriers to participate in program Psychosocial barriers identified (see note);The patient should benefit from training in stress management and relaxation.      Screening Interventions   Interventions Encouraged to exercise;To provide support and resources with identified psychosocial needs;Provide feedback about the scores to participant    Expected Outcomes Short Term goal: Utilizing psychosocial counselor, staff and physician to assist with identification of specific Stressors or current issues interfering with healing process. Setting desired goal for each stressor or  current issue identified.;Long Term Goal: Stressors or current issues are controlled or eliminated.;Short Term goal: Identification and review with participant of any Quality of Life or Depression concerns found by scoring the questionnaire.;Long Term goal: The participant improves quality of Life and PHQ9 Scores as seen by post scores and/or verbalization of changes           Quality of Life Scores:   Quality of Life - 05/20/19 1545      Quality of Life   Select Quality of Life      Quality of Life Scores   Health/Function Pre 20.14 %    Socioeconomic Pre 30 %    Psych/Spiritual Pre 30 %    Family Pre 30 %    GLOBAL Pre 25.94 %          Scores of 19 and below usually indicate a poorer quality of life in these areas.  A difference of  2-3 points  is a clinically meaningful difference.  A difference of 2-3 points in the total score of the Quality of Life Index has been associated with significant improvement in overall quality of life, self-image, physical symptoms, and general health in studies assessing change in quality of life.  PHQ-9: Recent Review Flowsheet Data    Depression screen [facility] 2/9 10/25/2019 09/09/2019 09/08/2019 09/08/2019 05/20/2019   Decreased Interest 2 0 _0 Down, Depressed, Hopeless 0 _1 PHQ - 2 Score _2 Altered sleeping _3 Tired, decreased energy 1 1 0 0 3   Change in appetite 0 2 0 0 2   Feeling bad or failure about yourself  1 0 _4 Trouble concentrating 0 0 0 0 2   Moving slowly or fidgety/restless 0 0 - 3 2   Suicidal thoughts 0 0 0 0 0   PHQ-9 Score _5 Difficult doing work/chores Somewhat difficult Somewhat difficult Not difficult at all Not difficult at all Somewhat difficult     Interpretation of Total Score  Total Score Depression Severity:  1-4 = Minimal depression, 5-9 = Mild depression, 10-14 = Moderate depression, 15-19 = Moderately severe depression, 20-27 = Severe depression   Psychosocial Evaluation and Intervention:  Psychosocial Evaluation - 06/30/19 1345      Psychosocial Evaluation & Interventions   Interventions Encouraged to exercise with the program and follow exercise prescription;Stress management education    Comments Caysen reports he is less stressed now that he has been coming to class and exercising at home. ADLs are still hard, but they are manageable; they have improved since starting rehab.    Expected Outcomes Short: Attend rehab regularly to build stamina and improve breating. Long:Improve stamina.    Continue Psychosocial Services  Follow up required by staff           Psychosocial Re-Evaluation:  Psychosocial Re-Evaluation    [place] Name 08/18/19 1408 09/09/19 1357 10/25/19 1352         Psychosocial Re-Evaluation   Current  issues with Current Stress Concerns History of Depression;Current Psychotropic Meds;Current Sleep Concerns History of Depression;Current Psychotropic Meds;Current Sleep Concerns     Comments Erby says he sleeps pretty well and has no stress concerns Reviewed patient health questionnaire (PHQ-9) with patient for follow up. Previously, patients score indicated signs/symptoms of depression.  Reviewed to see if patient is improving symptom wise while in program.  Score improved and patient states that it is because he has been able to workout more and have energy. Patient reports he is holding up well mentally. Reports compliance with medications. Reports sleeping is better, he is keeping a more consistent schedule with the times that he is going to bed and waking up. PHQ-9 did go up 1 point, however, patient has been out of rehab for some time.     Expected Outcomes Short: continue to exercise Long: maintai positive outlook Short: Continue to attend [facility] regularly for regular exercise and social engagement. Long: Continue to improve symptoms and manage a positive mental state. Short: Continue to maintain routine sleep schedule Long: Utilize exercise to maintain positive attitude and social interaction     Interventions -- Encouraged to attend Cardiac Rehabilitation for the exercise Encouraged to attend Cardiac Rehabilitation for the exercise     Continue Psychosocial Services  -- Follow up required by staff Follow up required by staff            Psychosocial Discharge (Final Psychosocial Re-Evaluation):  Psychosocial Re-Evaluation - 10/25/19 1352      Psychosocial Re-Evaluation   Current issues with History of Depression;Current Psychotropic Meds;Current Sleep Concerns    Comments Patient reports he is holding up well mentally. Reports compliance with medications. Reports sleeping is better, he is keeping a more consistent schedule with the times that he is going to bed and waking up. PHQ-9 did  go up 1 point, however, patient has been out of rehab for some time.    Expected Outcomes Short: Continue to maintain routine sleep schedule Long: Utilize exercise to maintain positive attitude and social interaction    Interventions Encouraged to attend Cardiac Rehabilitation for the exercise    Continue Psychosocial Services  Follow up required by staff           Vocational Rehabilitation: Provide vocational rehab assistance to qualifying candidates.   Vocational Rehab Evaluation & Intervention:  Vocational Rehab - 05/19/19 0913      Initial Vocational Rehab Evaluation & Intervention   Assessment shows need for Vocational Rehabilitation No   retired          Education: Education Goals: Education classes will be provided on a variety of topics geared toward better understanding of heart health and risk factor modification. Participant will state understanding/return demonstration of topics presented as noted by education test scores.  Learning Barriers/Preferences:  Learning Barriers/Preferences - 05/19/19 0914      Learning Barriers/Preferences   Learning Barriers Sight;Hearing   wears glasses, HOH   Learning Preferences None           General Cardiac Education Topics:  AED/CPR: - Group verbal and written instruction with the use of models to demonstrate the basic use of the AED with the basic ABC's of resuscitation.   Anatomy & Physiology of the Heart: - Group verbal and written instruction and models provide basic cardiac anatomy and physiology, with the coronary electrical and arterial systems. Review of Valvular disease and Heart Failure   Cardiac Procedures: - Group verbal and written instruction to review commonly prescribed medications for heart disease. Reviews the medication, class of the drug, and side effects. Includes the steps to properly store meds and maintain the prescription regimen. (beta blockers and nitrates)   Cardiac Medications I: - Group  verbal and written instruction to review commonly prescribed medications for heart disease. Reviews the medication, class of the drug, and side effects. Includes the steps to properly store meds and  maintain the prescription regimen.   Cardiac Medications II: -Group verbal and written instruction to review commonly prescribed medications for heart disease. Reviews the medication, class of the drug, and side effects. (all other drug classes)    Go Sex-Intimacy & Heart Disease, Get SMART - Goal Setting: - Group verbal and written instruction through game format to discuss heart disease and the return to sexual intimacy. Provides group verbal and written material to discuss and apply goal setting through the application of the S.M.A.R.T. Method.   Other Matters of the Heart: - Provides group verbal, written materials and models to describe Stable Angina and Peripheral Artery. Includes description of the disease process and treatment options available to the cardiac patient.   Infection Prevention: - Provides verbal and written material to individual with discussion of infection control including proper hand washing and proper equipment cleaning during exercise session.   Cardiac Rehab from 05/20/2019 in [facility]  Date 05/20/19  Educator AS  Instruction Review Code 1- Verbalizes Understanding      Falls Prevention: - Provides verbal and written material to individual with discussion of falls prevention and safety.   Cardiac Rehab from 05/20/2019 in [facility] Re[facility]  Date 05/20/19  Educator AS  Instruction Review Code 1- Verbalizes Understanding      Other: -Provides group and verbal instruction on various topics (see comments)   Knowledge Questionnaire Score:  Knowledge Questionnaire Score - 05/20/19 1546      Knowledge Questionnaire Score   Pre Score 22/26           Core Components/Risk Factors/Patient Goals at Admission:  Personal  Goals and Risk Factors at Admission - 05/20/19 1547      Core Components/Risk Factors/Patient Goals on Admission    Weight Management Yes;Weight Loss;Obesity    Intervention Weight Management: Develop a combined nutrition and exercise program designed to reach desired caloric intake, while maintaining appropriate intake of nutrient and fiber, sodium and fats, and appropriate energy expenditure required for the weight goal.;Weight Management: Provide education and appropriate resources to help participant work on and attain dietary goals.;Weight Management/Obesity: Establish reasonable short term and long term weight goals.;Obesity: Provide education and appropriate resources to help participant work on and attain dietary goals.    Admit Weight 206 lb (93.4 kg)    Goal Weight: Short Term 196 lb (88.9 kg)    Goal Weight: Long Term 186 lb (84.4 kg)    Expected Outcomes Short Term: Continue to assess and modify interventions until short term weight is achieved;Long Term: Adherence to nutrition and physical activity/exercise program aimed toward attainment of established weight goal;Weight Loss: Understanding of general recommendations for a balanced deficit meal plan, which promotes 1-2 lb weight loss per week and includes a negative energy balance of [id number] kcal/d;Understanding recommendations for meals to include 15-35% energy as protein, 25-35% energy from fat, 35-60% energy from carbohydrates, less than 217m of dietary cholesterol, 20-35 gm of total fiber daily;Understanding of distribution of calorie intake throughout the day with the consumption of 4-5 meals/snacks           Education:Diabetes - Individual verbal and written instruction to review signs/symptoms of diabetes, desired ranges of glucose level fasting, after meals and with exercise. Acknowledge that pre and post exercise glucose checks will be done for 3 sessions at entry of program.   Education: Know Your Numbers and Risk  Factors: -Group verbal and written instruction about important numbers in your health.  Discussion of what  are risk factors and how they play a role in the disease process.  Review of Cholesterol, Blood Pressure, Diabetes, and BMI and the role they play in your overall health.   Core Components/Risk Factors/Patient Goals Review:   Goals and Risk Factor Review    [place] Name 06/30/19 1347 08/18/19 1407 09/09/19 1359 10/25/19 1345       Core Components/Risk Factors/Patient Goals Review   Personal Goals Review Weight Management/Obesity;Heart Failure;Improve shortness of breath with ADL's;Stress;Hypertension Weight Management/Obesity;Heart Failure;Improve shortness of breath with ADL's;Stress;Hypertension Weight Management/Obesity;Improve shortness of breath with ADL's;Diabetes Weight Management/Obesity;Improve shortness of breath with ADL's;Diabetes    Review Basheer is taking his medications as directed. [name] managing his stress with exercise and his support system. He reports improving with ADLs. Braison continues to take meds as directed.  [name] walking some outside of class.  He says he is more limber and feels better since starting HT. Spoke to patient about their shortness of breath and what they can do to improve. Patient has been informed of breathing techniques when starting the program. Patient is informed to tell staff if they have had any med changes and that certain meds they are taking or not taking can be causing shortness of breath. Chas states he still reports some SOB, feels he is better at managing on his own, has learned PLB technique. Weight has been stable, reports he has lost some since the start of the program. Wife cooks at home and reports he is aiming towards "heart-healthy." Takes blood sugar at home every day, reports stable sugars and recognizes symptoms if he is too high or too low.    Expected Outcomes ST: continue taking medications as directed, continue managing stress  levels, continue with exercise and nutrition regimen Short:  continue with current program  Long: manage risk factors and HF Short: Attend [facility] regularly to improve shortness of breath with ADL's. Long: maintain independence with ADL's Short: Add exercise at homecontinue to cook heart-healthy to help contribute to weight loss Long: Manage lifestyle risk factors           Core Components/Risk Factors/Patient Goals at Discharge (Final Review):   Goals and Risk Factor Review - 10/25/19 1345      Core Components/Risk Factors/Patient Goals Review   Personal Goals Review Weight Management/Obesity;Improve shortness of breath with ADL's;Diabetes    Review Yoon states he still reports some SOB, feels he is better at managing on his own, has learned PLB technique. Weight has been stable, reports he has lost some since the start of the program. Wife cooks at home and reports he is aiming towards "heart-healthy." Takes blood sugar at home every day, reports stable sugars and recognizes symptoms if he is too high or too low.    Expected Outcomes Short: Add exercise at homecontinue to cook heart-healthy to help contribute to weight loss Long: Manage lifestyle risk factors           ITP Comments:  ITP Comments    [place] Name 05/19/19 0936 05/24/19 0936 06/02/19 1353 06/09/19 0602 07/07/19 0528   ITP Comments Completed virtual orientation today.  EP evaluation is scheduled for Thursday 3/4 at 2pm.  Documentation for diagnosis can be found in [facility] encounter 05/14/19.  Pt had oriented on 02/26/19 but never came in for his walk test for various reasons.  Now he is coming in for Cardiac Rehab. Completed Initial RD Eval First full day of exercise!  Patient was oriented to gym and equipment including functions,  settings, policies, and procedures.  Patient's individual exercise prescription and treatment plan were reviewed.  All starting workloads were established based on the results of the 6 minute walk test  done at initial orientation visit.  The plan for exercise progression was also introduced and progression will be customized based on patient's performance and goals. 30 day chart review completed. ITP sent to Dr Zachery Dakins Medical Director, for review,changes as needed and signature. Continue with ITP if no changes requested  New to program 30 Day review completed. Medical Director review done, changes made as directed,and approval shown by signature of Market researcher.   [place] Name 08/04/19 0541 09/01/19 0543 09/27/19 1410 09/29/19 0610 10/27/19 0553   ITP Comments 30 Day review completed. ITP review done, changes made as directed,and approval shown by signature of  Scientist, research (life sciences). 30 Day review completed. Medical Director ITP review done, changes made as directed, and signed approval by Medical Director. Out since 6/30.  Had right heart cath on 7/8. 30 Day review completed. Medical Director ITP review done, changes made as directed, and signed approval by Medical Director. 30 Day review completed. Medical Director ITP review done, changes made as directed, and signed approval by Medical Director.          Comments:

## 2019-10-27 NOTE — Chronic Care Management (AMB) (Signed)
Chronic Care Management   Follow Up Note   10/27/2019 Name: Lucas Caldwell MRN: [id number] DOB: July 04, 1933  Referred by: Lucas Hauser, DO Reason for referral : Chronic Care Management (Patient Phone Call)   Lucas Caldwell is a 86 y.o. year old male who is a primary care patient of Lucas Hauser, DO. The CCM team was consulted for assistance with chronic disease management and care coordination needs.    I reached out to Lucas Caldwell and his wife by phone today.   Review of patient status, including review of consultants reports, relevant laboratory and other test results, and collaboration with appropriate care team members and the patient's provider was performed as part of comprehensive patient evaluation and provision of chronic care management services.     Outpatient Encounter Medications as of 10/27/2019  Medication Sig  . Dulaglutide (TRULICITY) 5.63 OV/5.6EP SOPN Inject 0.75 mg into the skin once a week. (Patient taking differently: Inject 0.75 mg into the skin every Saturday. )  . empagliflozin (JARDIANCE) 10 MG TABS tablet Take 10 mg by mouth daily before breakfast.  . Insulin Glargine ([name] KWIKPEN) 100 UNIT/ML SOPN Inject 38 Units into the skin daily.   . isosorbide mononitrate (IMDUR) 60 MG 24 hr tablet Take 1 tablet (60 mg total) by mouth daily.  . polyethylene glycol (MIRALAX / GLYCOLAX) 17 g packet Take 17 g by mouth daily as needed for mild constipation.   . torsemide (DEMADEX) 20 MG tablet Take 3 tablets (60 mg total) by mouth daily.  [name] ACCU-CHEK AVIVA PLUS test strip Use to check blood sugar up to 2 times daily  . Accu-Chek Softclix Lancets lancets Use to check blood sugar up to 2 times daily.  [name] acetaminophen (TYLENOL) 500 MG tablet Take 1,000 mg by mouth every 6 (six) hours as needed for moderate pain or headache.   . Alcohol Swabs 70 % PADS Use when checking blood sugar up to 3x daily  . allopurinol (ZYLOPRIM) 100 MG tablet Take  1 tablet (100 mg total) by mouth daily.  [name] aspirin 81 MG chewable tablet Chew 81 mg by mouth daily.  . Blood Glucose Monitoring Suppl (ACCU-CHEK AVIVA PLUS) w/Device KIT Use to check blood sugar up to 2 times daily  . bosutinib (BOSULIF) 100 MG tablet Take 100 mg by mouth at bedtime as needed (tired/not feeling well). Take with food.   . gabapentin (NEURONTIN) 300 MG capsule TAKE TWO CAPSULES BY MOUTH TWICE DAILY. (MAX DOSE = 1200MG DAILY FOR KIDNEY FUNCTION)  . hydrALAZINE (APRESOLINE) 25 MG tablet Take 1 tablet (25 mg total) by mouth 3 (three) times daily.  . Insulin Pen Needle 31G X 5 MM MISC 31 g by Does not apply route as directed.  [name] levothyroxine (SYNTHROID) 25 MCG tablet Take 1 tablet (25 mcg total) by mouth daily before breakfast.  . losartan (COZAAR) 25 MG tablet Take 1 tablet (25 mg total) by mouth daily.  . Menthol-Zinc Oxide (GOLD BOND EX) Apply 1 application topically daily as needed (muscle pain).  . metoprolol succinate (TOPROL-XL) 25 MG 24 hr tablet Take 1 tablet (25 mg total) by mouth daily.  . Multiple Vitamin (MULTIVITAMIN WITH MINERALS) TABS tablet Take 1 tablet by mouth daily.  . nitroGLYCERIN (NITROSTAT) 0.4 MG SL tablet Place 1 tablet (0.4 mg total) under the tongue every 5 (five) minutes x 3 doses as needed for chest pain.  [name] omeprazole (PRILOSEC) 20 MG capsule Take 20 mg by mouth in the morning and  at bedtime. Morning & afternoon  . Polyethyl Glycol-Propyl Glycol (SYSTANE OP) Place 1 drop into both eyes 3 (three) times daily as needed (dry/irritated eyes.).   [name] potassium chloride SA (KLOR-CON) 20 MEQ tablet Take 1 tablet (20 mEq total) by mouth 2 (two) times daily.  [name] PROVENTIL HFA 108 (90 Base) MCG/ACT inhaler Inhale 2 puffs into the lungs every 6 (six) hours as needed for wheezing or shortness of breath.  . ranolazine (RANEXA) 500 MG 12 hr tablet Take 1 tablet (500 mg total) by mouth 2 (two) times daily.  . rosuvastatin (CRESTOR) 5 MG tablet Take 1 tablet (5 mg total) by  mouth daily. (Patient taking differently: Take 5 mg by mouth every evening. )  . traMADol (ULTRAM) 50 MG tablet Take 1-2 tablets (50-100 mg total) by mouth at bedtime as needed. (Patient taking differently: Take 50-100 mg by mouth at bedtime as needed (pain.). )  . warfarin (COUMADIN) 3 MG tablet TAKE 1 TO 1 & 1/2 TABLETS ONCE A DAY AS DIRECTED BY THE COUMADIN CLINIC   No facility-administered encounter medications on file as of 10/27/2019.    Goals Addressed            This Visit's Progress   . PharmD - Medication Managment       CARE PLAN ENTRY (see longitudinal plan of care for additional care plan information)  Current Barriers:  . Financial o Note patient has Partial Extra Help/LIS Subsidy . Limited vision . Chronic Disease Management support and education needs related to multiple chronic disease processes  Pharmacist Clinical Goal(s):  [name] Over the next 30 days, patient will work with CM Pharmacist to address needs related to medication adherence and diabetes management  Interventions: . Perform chart review . Follow up with patient and wife regarding medication adherence ? Note patient's wife manages weekly pillbox for patient ? Confirms patient decreased isosorbide mononitrate dose to 60 mg daily as directed by Cardiologist on 8/4 ? Reports lightheadedness/dizziness somewhat better since dose decreased ? Confirms taking readings of CardioMEMS system at home as directed . Discuss CBG control and monitoring o Reports patient taking:  [name] 38 untis daily  Trulicity 2.09 mg once weekly  Jardiance 10 mg once daily before breakfast o Patient reports recent AM CBGs:  Yesterday: 129  Today: 155 (but reports taken after started eating) ? Encourage continuing to monitor and keep log of CBGs  ? [name] reports patient currently has a couple of boxes of Trulicity remaining. Discuss plan to follow up with PCP/CM Pharmacist when ~1 month supply remaining to request  provider send in Rx for Trulicity 1.5 mg weekly to assistance program . Denies any medication questions/concerns today.  Patient Self Care Activities:  . Check blood sugar, blood pressure and daily weight as directed by providers  . Patient takes medications, with assistance of wife, as directed o Mrs. Ryser manages patient's medications using weekly pillbox as adherence tool . Patient attends scheduled medical appointments  Please see past updates related to this goal by clicking on the "Past Updates" button in the selected goal         Plan  Telephone follow up appointment with care management team member scheduled for: 9/13 at 11 am  [name], PharmD, [place] [phone]

## 2019-10-27 NOTE — Progress Notes (Signed)
Daily Session Note  Patient Details  Name: Lucas Caldwell MRN: [id number] Date of Birth: 1933/08/18 Referring Provider:     Cardiac Rehab from 05/20/2019 in [facility]  Referring Provider Lucas Caldwell      Encounter Date: 10/27/2019  Check In:  Session Check In - 10/27/19 1406      Check-In   Supervising physician immediately available to respond to emergencies See telemetry face sheet for immediately available ER MD    Location [facility]-Cardiac & Pulmonary Rehab    Staff Present Renita Papa, RN BSN;Joseph Lou Miner, [place] Exercise Physiologist    Virtual Visit No    Medication changes reported     No    Fall or balance concerns reported    No    Warm-up and Cool-down Performed on first and last piece of equipment    Resistance Training Performed Yes    VAD Patient? No    PAD/SET Patient? No      Pain Assessment   Currently in Pain? No/denies             Exercise Prescription Changes - 10/27/19 1400      Response to Exercise   Blood Pressure (Admit) 128/74    Blood Pressure (Exercise) 130/66    Blood Pressure (Exit) 124/74    Heart Rate (Admit) 95 bpm    Heart Rate (Exercise) 111 bpm    Heart Rate (Exit) 97 bpm    Rating of Perceived Exertion (Exercise) 12    Symptoms none    Duration Continue with 30 min of aerobic exercise without signs/symptoms of physical distress.    Intensity THRR unchanged      Progression   Progression Continue to progress workloads to maintain intensity without signs/symptoms of physical distress.    Average METs 2.5      Resistance Training   Training Prescription Yes    Weight 3 lb    Reps 10-15      Interval Training   Interval Training No      NuStep   Level 4    Minutes 15    METs 2.5      Home Exercise Plan   Plans to continue exercise at Home (comment)   walking   Frequency Add 2 additional days to program exercise sessions.    Initial Home Exercises Provided 10/25/19             Social History   Tobacco Use  Smoking Status Former Smoker  . Packs/day: 1.75  . Years: 20.00  . Pack years: 35.00  . Types: Cigarettes  . Start date: 38  . Quit date: 64  . Years since quitting: 46.6  Smokeless Tobacco Former Systems developer    Goals Met:  Independence with exercise equipment Exercise tolerated well No report of cardiac concerns or symptoms Strength training completed today  Goals Unmet:  Not Applicable  Comments: Pt able to follow exercise prescription today without complaint.  Will continue to monitor for progression.    Dr. Emily Caldwell is Medical Director for [facility].

## 2019-10-27 NOTE — Patient Instructions (Signed)
Thank you allowing the Chronic Care Management Team to be a part of your care! It was a pleasure speaking with you today!     CCM (Chronic Care Management) Team    Noreene Larsson RN, MSN, CCM Nurse Care Coordinator  [phone]   [name] PharmD  Clinical Pharmacist  [phone]   Eula Fried LCSW Clinical Social Worker [phone]  Visit Information  Goals Addressed            This Visit's Progress   . PharmD - Medication Managment       CARE PLAN ENTRY (see longitudinal plan of care for additional care plan information)  Current Barriers:  . Financial o Note patient has Partial Extra Help/LIS Subsidy . Limited vision . Chronic Disease Management support and education needs related to multiple chronic disease processes  Pharmacist Clinical Goal(s):  [name] Over the next 30 days, patient will work with CM Pharmacist to address needs related to medication adherence and diabetes management  Interventions: . Perform chart review . Follow up with patient and wife regarding medication adherence ? Note patient's wife manages weekly pillbox for patient ? Confirms patient decreased isosorbide mononitrate dose to 60 mg daily as directed by Cardiologist on 8/4 ? Reports lightheadedness/dizziness somewhat better since dose decreased ? Confirms taking readings of CardioMEMS system at home as directed . Discuss CBG control and monitoring o Reports patient taking:  [name] 38 untis daily  Trulicity 9.24 mg once weekly  Jardiance 10 mg once daily before breakfast o Patient reports recent AM CBGs:  Yesterday: 129  Today: 155 (but reports taken after started eating) ? Encourage continuing to monitor and keep log of CBGs  ? Mrs. Bushey reports patient currently has a couple of boxes of Trulicity remaining. Discuss plan to follow up with PCP/CM Pharmacist when ~1 month supply remaining to request provider send in Rx for Trulicity 1.5 mg weekly to assistance  program . Denies any medication questions/concerns today.  Patient Self Care Activities:  . Check blood sugar, blood pressure and daily weight as directed by providers  . Patient takes medications, with assistance of wife, as directed o Mrs. Mcquiston manages patient's medications using weekly pillbox as adherence tool . Patient attends scheduled medical appointments  Please see past updates related to this goal by clicking on the "Past Updates" button in the selected goal         Patient verbalizes understanding of instructions provided today.   Telephone follow up appointment with care management team member scheduled for: 9/13 at 47 am  [name], PharmD, [place] [phone]

## 2019-10-28 DIAGNOSIS — N1832 Chronic kidney disease, stage 3b: Secondary | ICD-10-CM | POA: Diagnosis not present

## 2019-10-28 DIAGNOSIS — I10 Essential (primary) hypertension: Secondary | ICD-10-CM | POA: Diagnosis not present

## 2019-10-28 DIAGNOSIS — E1122 Type 2 diabetes mellitus with diabetic chronic kidney disease: Secondary | ICD-10-CM | POA: Diagnosis not present

## 2019-10-28 DIAGNOSIS — N400 Enlarged prostate without lower urinary tract symptoms: Secondary | ICD-10-CM | POA: Diagnosis not present

## 2019-11-01 ENCOUNTER — Encounter: Payer: Medicare HMO | Admitting: *Deleted

## 2019-11-02 ENCOUNTER — Ambulatory Visit: Payer: Self-pay

## 2019-11-02 NOTE — Telephone Encounter (Signed)
Patients wife called to ask why gabapentin has noted on label  Max dose1200 mg daily for kidney function. Patient wife was informed that the notation was to note that to exceed the 1200 mg dose could be damaging to kidney function.  Instructions for taking gabapentin were reviewed with her.  She verbalized understanding of all information.   Reason for Disposition . Caller has medicine question only, adult not sick, AND triager answers question  Answer Assessment - Initial Assessment Questions 1. NAME of MEDICATION: "What medicine are you calling about?"     gabapentin 2. QUESTION: "What is your question?" (e.g., medication refill, side effect)    What is it for why does it say 1200 for kidney function 3. PRESCRIBING HCP: "Who prescribed it?" Reason: if prescribed by specialist, call should be referred to that group.    Dr Parks Ranger 4. SYMPTOMS: "Do you have any symptoms?"     None 5. SEVERITY: If symptoms are present, ask "Are they mild, moderate or severe?"     N/A no symptoms 6. PREGNANCY:  "Is there any chance that you are pregnant?" "When was your last menstrual period?"    N/A  Protocols used: MEDICATION QUESTION CALL-A-AH

## 2019-11-03 ENCOUNTER — Other Ambulatory Visit: Payer: Self-pay

## 2019-11-03 ENCOUNTER — Encounter: Payer: Medicare HMO | Admitting: *Deleted

## 2019-11-03 DIAGNOSIS — Z7901 Long term (current) use of anticoagulants: Secondary | ICD-10-CM | POA: Diagnosis not present

## 2019-11-03 DIAGNOSIS — I110 Hypertensive heart disease with heart failure: Secondary | ICD-10-CM | POA: Diagnosis not present

## 2019-11-03 DIAGNOSIS — I5042 Chronic combined systolic (congestive) and diastolic (congestive) heart failure: Secondary | ICD-10-CM | POA: Diagnosis not present

## 2019-11-03 DIAGNOSIS — Z794 Long term (current) use of insulin: Secondary | ICD-10-CM | POA: Diagnosis not present

## 2019-11-03 DIAGNOSIS — Z7982 Long term (current) use of aspirin: Secondary | ICD-10-CM | POA: Diagnosis not present

## 2019-11-03 DIAGNOSIS — Z79899 Other long term (current) drug therapy: Secondary | ICD-10-CM | POA: Diagnosis not present

## 2019-11-03 DIAGNOSIS — E119 Type 2 diabetes mellitus without complications: Secondary | ICD-10-CM | POA: Diagnosis not present

## 2019-11-03 DIAGNOSIS — N400 Enlarged prostate without lower urinary tract symptoms: Secondary | ICD-10-CM | POA: Diagnosis not present

## 2019-11-03 DIAGNOSIS — Z87891 Personal history of nicotine dependence: Secondary | ICD-10-CM | POA: Diagnosis not present

## 2019-11-03 NOTE — Progress Notes (Signed)
Daily Session Note  Patient Details  Name: Lucas Caldwell MRN: [id number] Date of Birth: March 24, 1933 Referring Provider:     Cardiac Rehab from 05/20/2019 in [facility]  Referring Provider Bensimhon      Encounter Date: 11/03/2019  Check In:  Session Check In - 11/03/19 1721      Check-In   Supervising physician immediately available to respond to emergencies See telemetry face sheet for immediately available ER MD    Location [facility]-Cardiac & Pulmonary Rehab    Staff Present Nyoka Cowden, RN, BSN, MA;Joseph Hood Sharren [place], [place] Exercise Physiologist    Virtual Visit No    Medication changes reported     No    Fall or balance concerns reported    No    Warm-up and Cool-down Performed on first and last piece of equipment    Resistance Training Performed Yes    VAD Patient? No    PAD/SET Patient? No              Social History   Tobacco Use  Smoking Status Former Smoker  . Packs/day: 1.75  . Years: 20.00  . Pack years: 35.00  . Types: Cigarettes  . Start date: 55  . Quit date: 74  . Years since quitting: 46.6  Smokeless Tobacco Former Systems developer    Goals Met:  Independence with exercise equipment Exercise tolerated well No report of cardiac concerns or symptoms Strength training completed today  Goals Unmet:  Not Applicable  Comments: Pt able to follow exercise prescription today without complaint.  Will continue to monitor for progression.   Dr. Emily Filbert is Medical Director for [place] [name].

## 2019-11-04 ENCOUNTER — Other Ambulatory Visit: Payer: Self-pay

## 2019-11-04 ENCOUNTER — Encounter (HOSPITAL_COMMUNITY): Payer: Self-pay

## 2019-11-04 ENCOUNTER — Ambulatory Visit (HOSPITAL_COMMUNITY)
Admission: RE | Admit: 2019-11-04 | Discharge: 2019-11-04 | Disposition: A | Discharge status: Home / Self Care | Payer: Medicare HMO | Source: Ambulatory Visit | Attending: Internal Medicine | Admitting: Internal Medicine

## 2019-11-04 ENCOUNTER — Telehealth (HOSPITAL_COMMUNITY): Payer: Self-pay | Admitting: Cardiology

## 2019-11-04 VITALS — BP 110/80 | HR 85 | Wt 204.2 lb

## 2019-11-04 DIAGNOSIS — I130 Hypertensive heart and chronic kidney disease with heart failure and stage 1 through stage 4 chronic kidney disease, or unspecified chronic kidney disease: Secondary | ICD-10-CM | POA: Insufficient documentation

## 2019-11-04 DIAGNOSIS — E039 Hypothyroidism, unspecified: Secondary | ICD-10-CM | POA: Insufficient documentation

## 2019-11-04 DIAGNOSIS — I480 Paroxysmal atrial fibrillation: Secondary | ICD-10-CM | POA: Diagnosis not present

## 2019-11-04 DIAGNOSIS — M1990 Unspecified osteoarthritis, unspecified site: Secondary | ICD-10-CM | POA: Insufficient documentation

## 2019-11-04 DIAGNOSIS — I440 Atrioventricular block, first degree: Secondary | ICD-10-CM | POA: Insufficient documentation

## 2019-11-04 DIAGNOSIS — H5461 Unqualified visual loss, right eye, normal vision left eye: Secondary | ICD-10-CM | POA: Diagnosis not present

## 2019-11-04 DIAGNOSIS — I453 Trifascicular block: Secondary | ICD-10-CM | POA: Insufficient documentation

## 2019-11-04 DIAGNOSIS — D509 Iron deficiency anemia, unspecified: Secondary | ICD-10-CM | POA: Insufficient documentation

## 2019-11-04 DIAGNOSIS — Z881 Allergy status to other antibiotic agents status: Secondary | ICD-10-CM | POA: Insufficient documentation

## 2019-11-04 DIAGNOSIS — I2510 Atherosclerotic heart disease of native coronary artery without angina pectoris: Secondary | ICD-10-CM | POA: Diagnosis not present

## 2019-11-04 DIAGNOSIS — I471 Supraventricular tachycardia: Secondary | ICD-10-CM | POA: Insufficient documentation

## 2019-11-04 DIAGNOSIS — I428 Other cardiomyopathies: Secondary | ICD-10-CM | POA: Insufficient documentation

## 2019-11-04 DIAGNOSIS — I5022 Chronic systolic (congestive) heart failure: Secondary | ICD-10-CM | POA: Diagnosis not present

## 2019-11-04 DIAGNOSIS — Z794 Long term (current) use of insulin: Secondary | ICD-10-CM | POA: Insufficient documentation

## 2019-11-04 DIAGNOSIS — E669 Obesity, unspecified: Secondary | ICD-10-CM | POA: Diagnosis not present

## 2019-11-04 DIAGNOSIS — Z856 Personal history of leukemia: Secondary | ICD-10-CM | POA: Diagnosis not present

## 2019-11-04 DIAGNOSIS — Z87891 Personal history of nicotine dependence: Secondary | ICD-10-CM | POA: Insufficient documentation

## 2019-11-04 DIAGNOSIS — R0683 Snoring: Secondary | ICD-10-CM | POA: Diagnosis not present

## 2019-11-04 DIAGNOSIS — E1122 Type 2 diabetes mellitus with diabetic chronic kidney disease: Secondary | ICD-10-CM | POA: Insufficient documentation

## 2019-11-04 DIAGNOSIS — I5042 Chronic combined systolic (congestive) and diastolic (congestive) heart failure: Secondary | ICD-10-CM | POA: Insufficient documentation

## 2019-11-04 DIAGNOSIS — Z888 Allergy status to other drugs, medicaments and biological substances status: Secondary | ICD-10-CM | POA: Insufficient documentation

## 2019-11-04 DIAGNOSIS — Z8719 Personal history of other diseases of the digestive system: Secondary | ICD-10-CM | POA: Insufficient documentation

## 2019-11-04 DIAGNOSIS — E785 Hyperlipidemia, unspecified: Secondary | ICD-10-CM | POA: Insufficient documentation

## 2019-11-04 DIAGNOSIS — N400 Enlarged prostate without lower urinary tract symptoms: Secondary | ICD-10-CM | POA: Insufficient documentation

## 2019-11-04 DIAGNOSIS — M109 Gout, unspecified: Secondary | ICD-10-CM | POA: Diagnosis not present

## 2019-11-04 DIAGNOSIS — Z8546 Personal history of malignant neoplasm of prostate: Secondary | ICD-10-CM | POA: Insufficient documentation

## 2019-11-04 DIAGNOSIS — K219 Gastro-esophageal reflux disease without esophagitis: Secondary | ICD-10-CM | POA: Diagnosis not present

## 2019-11-04 DIAGNOSIS — N184 Chronic kidney disease, stage 4 (severe): Secondary | ICD-10-CM | POA: Insufficient documentation

## 2019-11-04 DIAGNOSIS — Z7982 Long term (current) use of aspirin: Secondary | ICD-10-CM | POA: Insufficient documentation

## 2019-11-04 DIAGNOSIS — Z79899 Other long term (current) drug therapy: Secondary | ICD-10-CM | POA: Insufficient documentation

## 2019-11-04 DIAGNOSIS — Z7901 Long term (current) use of anticoagulants: Secondary | ICD-10-CM | POA: Insufficient documentation

## 2019-11-04 DIAGNOSIS — Z955 Presence of coronary angioplasty implant and graft: Secondary | ICD-10-CM | POA: Diagnosis not present

## 2019-11-04 DIAGNOSIS — Z8249 Family history of ischemic heart disease and other diseases of the circulatory system: Secondary | ICD-10-CM | POA: Insufficient documentation

## 2019-11-04 LAB — BASIC METABOLIC PANEL
Anion gap: 12 (ref 5–15)
BUN: 21 mg/dL (ref 8–23)
CO2: 21 mmol/L — ABNORMAL LOW (ref 22–32)
Calcium: 9.2 mg/dL (ref 8.9–10.3)
Chloride: 102 mmol/L (ref 98–111)
Creatinine, Ser: 1.71 mg/dL — ABNORMAL HIGH (ref 0.61–1.24)
GFR calc Af Amer: 42 mL/min — ABNORMAL LOW (ref >60)
GFR calc non Af Amer: 36 mL/min — ABNORMAL LOW (ref >60)
Glucose, Bld: 207 mg/dL — ABNORMAL HIGH (ref 70–99)
Potassium: 4.1 mmol/L (ref 3.5–5.1)
Sodium: 135 mmol/L (ref 135–145)

## 2019-11-04 LAB — BRAIN NATRIURETIC PEPTIDE: B Natriuretic Peptide: 100.5 pg/mL — ABNORMAL HIGH (ref 0.0–100.0)

## 2019-11-04 LAB — CBC
HCT: 39.9 % (ref 39.0–52.0)
Hemoglobin: 11.9 g/dL — ABNORMAL LOW (ref 13.0–17.0)
MCH: 26.6 pg (ref 26.0–34.0)
MCHC: 29.8 g/dL — ABNORMAL LOW (ref 30.0–36.0)
MCV: 89.1 fL (ref 80.0–100.0)
Platelets: 223 10*3/uL (ref 150–400)
RBC: 4.48 MIL/uL (ref 4.22–5.81)
RDW: 15.9 % — ABNORMAL HIGH (ref 11.5–15.5)
WBC: 6.8 10*3/uL (ref 4.0–10.5)
nRBC: 0 % (ref 0.0–0.2)

## 2019-11-04 MED ORDER — POTASSIUM CHLORIDE CRYS ER 20 MEQ PO TBCR: 60.0000 meq | EXTENDED_RELEASE_TABLET | Freq: Every day | ORAL | 1 refills | Status: DC

## 2019-11-04 MED ORDER — TORSEMIDE 20 MG PO TABS: 80.0000 mg | ORAL_TABLET | Freq: Every day | ORAL | 2 refills | Status: DC

## 2019-11-04 NOTE — Telephone Encounter (Signed)
Pt aware and voiced understanding.  Will have repeat labs done with Dr End

## 2019-11-04 NOTE — Patient Instructions (Addendum)
Lab work done today. We will notify you of any abnormal lab work. No news is good news!  Please follow up with the [facility] in 2-3 months.  At the [facility], you and your health needs are our priority. As part of our continuing mission to provide you with exceptional heart care, we have created designated Provider Care Teams. These Care Teams include your primary Cardiologist (physician) and Advanced Practice Providers (APPs- Physician Assistants and Nurse Practitioners) who all work together to provide you with the care you need, when you need it.   You may see any of the following providers on your designated Care Team at your next follow up: [name] Dr Glori Bickers . Dr Loralie Champagne . Darrick Grinder, NP . Lyda Jester, PA . Audry Riles, PharmD   Please be sure to bring in all your medications bottles to every appointment.

## 2019-11-04 NOTE — Progress Notes (Signed)
ADVANCED HF CLINIC PROGRESS NOTE  Primary Cardiologist: Dr. Saunders Revel AHF: [name]   Reasons for Visit: F/u for chronic systolic heart failure   HPI: Lucas Caldwell is a 86 y.o. male with history of coronary artery disease,chronic systolic heart failure due to likelymixed ischemic and nonischemic cardiomyopathy, hypertension, hyperlipidemia, type 2 diabetes mellitus, paroxysmal atrial fibrillation, chronic kidney disease stage III, CML, iron deficiency anemia,GI bleed due to duodenal ulcers,BPH, and hypothyroidism who was initially referred by Dr. Saunders Revel for further evaluation of his HF.    Has h/o NICM previously followed by Dr. Carolynn Serve at [facility]. He had good response to medical therapy in 2012 EF was reported to be 50%   Had PCI of his RCA CAD s/p RCA stent x2 08/2016 at [facility].    Switched his care to Dr. Saunders Revel in 2019. Recently struggling with increasing DOE.   Echo 08/2018 LVEF 35-40%, indeterminite LV diastolic filling pressures, tricuspid AV, mildly dilated pulmonary artery, no significant valvular disfunction.   Cardiac cath 08/2018 70% LM stenosis with abnl FFR of 0.86, though drop in iFR was due to diffuse prox LAD disease and only small gradient involving ostial LM. Imdur was increased.   Admitted 04/13/19 with chest pain and A/C systolic heart failure. Repeat Cath 1/21 LM 60% LAD 70% with +FFR otherwise diffuse non-obstructive CAD -> PCI/DES LAD 1/21. RHC: RA 10 RV 53/12 PA 53/30 (40) PCWP 28 CO/CI 4.2/2.1 PA sat 67%.  Echo 04/22/19 EF 30-35%  Referred to [facility] 04/2019 for further management of his systolic HF. He has been on goal directed medical therapy, including metoprolol succinate (dose limited by bifascicular block), hydralazine, and isosorbide mononitrate.  He has had problems with renal insufficiency in the past, limiting ACEI/ARB use in the past, though decision was recently made to rechallenge him given worsening HF and declining LVEF. At f/u visit w/ Dr. Saunders Revel 3/21  Losartan 25 mg was added. He has done well with losartan. No issues.   Zio patch 06/2019 showed SR w/ 1st degree AVB, avg HR 81 bpm, 1 brief 5 beat run of NSVT, Two runs of SVT - the longest lasting 5 beats with an avg rate of 104 bpm. Rare PACs and PVCs noted.   At his most recent [facility] f/u 5/20, his volume status was felt to be low, ReDs clip was 31%. Torsemide was reduced to 60 mg daily. He now has CardioMEMS implant, placed by [name] on 7/8. DPAP goal 15 mmHg. Post implant was c/b right groin wound infection that was treated and monitor closely by Dr. Saunders Revel. He was paced on a 7 day course of cephalexin. He had return f/u again w/ Dr. Saunders Revel on 8/4 and his groin wound had completely healed.   He now returns to [facility] for routine f/u. He has been inconsistent w/ his cardioMEMS transmissions. Has missed 25 readings since implant. His last transmission was 2 days ago on 8/17 and his DPAP was right at goal at 15. His DPAPs have ranged from 14-19. His weight is stable at 204 lb, c/w normal Lucas. BP well controlled 110/80. He has not tolerated titration of meds due to frequent dizziness. He ambulates w/ a cane. Denies falls. He reports that his groin is stable. No further issues. Also denies CP.     Past Medical History:  Diagnosis Date  . Arthritis    "probably in his legs" (08/15/2016)  . Blind right eye   . BPH (benign prostatic hyperplasia)   . Breast asymmetry  Left breast is larger, present for several years.  [name] CAD (coronary artery disease)    a. Cath in the late 90's - reportedly ok;  b. 2014 s/p stenting x 2 @ [facility]; c 08/19/16 Cath/PCI with DES -> RCA, plan to treat LM 70% medically. Seen by surgery and felt to be too high risk for CABG; d. 08/2018 Cath: LM 70 (iFR 0.86), LAD 20ost, 40p, 50/27m D1 70, LCX patent stent, OM1 30, RCA patent stent, 439mSR, 40d, RPDA 30. PCWP 8. CO/CI 4.0/2.1; e. 03/2019 PCI to LAD (2.5x15 Resolute Onyx DES).  . Chronic combined systolic (congestive) and diastolic  (congestive) heart failure (HCL[place]   a. Previously reduced EF-->50% by echo in 2012;  b. 06/2015 Echo: EF 50-55%  c. 07/2016 Echo: EF 45-50%; d. 12/2017 Echo: EF 55%, Gr1 DD, mild MR; e. 08/2018 Echo: EF 35-40%, mildly dil PA; f. 04/2019 Echo: EF 30-35%, glob HK, nl RV fxn; g. s/p Cardiomems.  . Chronic kidney disease (CKD), stage IV (severe) (HCHaz[place]  . CML (chronic myelocytic leukemia) (HCBrus[facility]  . GERD (gastroesophageal reflux disease)   . GIB (gastrointestinal bleeding)    a. 12/2017 3 unit PRBC GIB in setting of coumadin-->Endo/colon multiple duodenal ulcers and a single bleeding ulcer in the proximal ascending colon status post hemostatic clipping x2.  . Gout   . Hyperlipemia   . Hypertension   . Hypothyroidism   . Iron deficiency anemia   . Ischemic cardiomyopathy    a. Previously reduced EF-->50% by echo in 2012;  b. 06/2015 Echo: EF 50-55%, Gr2 DD, mild MR, mildly dil LA, Ao sclerosis, mild TR;  c. 07/2016 Echo: EF 45-50%; d. 12/2017 Echo: EF 55%, Gr1 DD, mild MR; e. 08/2018 Echo: EF 35-40%; f. 04/2019 Echo: EF 30-35%.  . Migraine    "in the 1960s" (08/15/2016)  . PAF (paroxysmal atrial fibrillation) (HCC)    a. ? Dx 2014-->s/p DCCV;  b. CHA2DS2VASc = 6--> Coumadin.  . Prostate cancer (HCBul[facility]  . RBBB   . Type II diabetes mellitus (HCLockes[place]    Current Outpatient Medications  Medication Sig Dispense Refill  . ACCU-CHEK AVIVA PLUS test strip Use to check blood sugar up to 2 times daily 200 each 3  . Accu-Chek Softclix Lancets lancets Use to check blood sugar up to 2 times daily. 200 each 3  . acetaminophen (TYLENOL) 500 MG tablet Take 1,000 mg by mouth every 6 (six) hours as needed for moderate pain or headache.     . Alcohol Swabs 70 % PADS Use when checking blood sugar up to 3x daily 100 each 11  . allopurinol (ZYLOPRIM) 100 MG tablet Take 1 tablet (100 mg total) by mouth daily. 90 tablet 3  . aspirin 81 MG chewable tablet Chew 81 mg by mouth daily.    . Blood Glucose Monitoring Suppl (ACCU-CHEK  AVIVA PLUS) w/Device KIT Use to check blood sugar up to 2 times daily 1 kit 0  . bosutinib (BOSULIF) 100 MG tablet Take 100 mg by mouth at bedtime as needed (tired/not feeling well). Take with food.     . Dulaglutide (TRULICITY) 0.4.09GBD/5.3GDOPN Inject 0.75 mg into the skin once a week. (Patient taking differently: Inject 0.75 mg into the skin every Saturday. ) 4 pen 0  . empagliflozin (JARDIANCE) 10 MG TABS tablet Take 10 mg by mouth daily before breakfast. 90 tablet 3  . gabapentin (NEURONTIN) 300 MG capsule TAKE TWO CAPSULES BY MOUTH TWICE DAILY. (MAX DOSE =  1200MG DAILY FOR KIDNEY FUNCTION) 360 capsule 1  . hydrALAZINE (APRESOLINE) 25 MG tablet Take 1 tablet (25 mg total) by mouth 3 (three) times daily. 270 tablet 3  . Insulin Glargine ([name] KWIKPEN) 100 UNIT/ML SOPN Inject 38 Units into the skin daily.     . Insulin Pen Needle 31G X 5 MM MISC 31 g by Does not apply route as directed. 100 each 5  . isosorbide mononitrate (IMDUR) 60 MG 24 hr tablet Take 1 tablet (60 mg total) by mouth daily. 135 tablet 2  . levothyroxine (SYNTHROID) 25 MCG tablet Take 1 tablet (25 mcg total) by mouth daily before breakfast. 90 tablet 1  . losartan (COZAAR) 25 MG tablet Take 1 tablet (25 mg total) by mouth daily. 90 tablet 1  . Menthol-Zinc Oxide (GOLD BOND EX) Apply 1 application topically daily as needed (muscle pain).    . metoprolol succinate (TOPROL-XL) 25 MG 24 hr tablet Take 1 tablet (25 mg total) by mouth daily. 90 tablet 1  . Multiple Vitamin (MULTIVITAMIN WITH MINERALS) TABS tablet Take 1 tablet by mouth daily.    [name] omeprazole (PRILOSEC) 20 MG capsule Take 20 mg by mouth in the morning and at bedtime. Morning & afternoon    . Polyethyl Glycol-Propyl Glycol (SYSTANE OP) Place 1 drop into both eyes 3 (three) times daily as needed (dry/irritated eyes.).     [name] polyethylene glycol (MIRALAX / GLYCOLAX) 17 g packet Take 17 g by mouth daily as needed for mild constipation.     . potassium chloride SA  (KLOR-CON) 20 MEQ tablet Take 1 tablet (20 mEq total) by mouth 2 (two) times daily. 180 tablet 1  . PROVENTIL HFA 108 (90 Base) MCG/ACT inhaler Inhale 2 puffs into the lungs every 6 (six) hours as needed for wheezing or shortness of breath.    . ranolazine (RANEXA) 500 MG 12 hr tablet Take 1 tablet (500 mg total) by mouth 2 (two) times daily. 180 tablet 2  . rosuvastatin (CRESTOR) 5 MG tablet Take 1 tablet (5 mg total) by mouth daily. (Patient taking differently: Take 5 mg by mouth every evening. ) 90 tablet 3  . torsemide (DEMADEX) 20 MG tablet Take 3 tablets (60 mg total) by mouth daily. 180 tablet 2  . traMADol (ULTRAM) 50 MG tablet Take 1-2 tablets (50-100 mg total) by mouth at bedtime as needed. (Patient taking differently: Take 50-100 mg by mouth at bedtime as needed (pain.). ) 30 tablet 2  . warfarin (COUMADIN) 3 MG tablet TAKE 1 TO 1 & 1/2 TABLETS ONCE A DAY AS DIRECTED BY THE COUMADIN CLINIC 120 tablet 0  . nitroGLYCERIN (NITROSTAT) 0.4 MG SL tablet Place 1 tablet (0.4 mg total) under the tongue every 5 (five) minutes x 3 doses as needed for chest pain. (Patient not taking: Reported on 11/04/2019) 25 tablet 4   No current facility-administered medications for this encounter.    Allergies  Allergen Reactions  . Clopidogrel Anaphylaxis    altered mental status;  (electrolytes "out of wack" and confusion per family; THEY DO NOT REMEMBER ANY OTHER REACTION)- MSB 10/10/15  . Ciprofloxacin Itching  . Mirtazapine Diarrhea    Bad dreams   . Spironolactone     gynecomastia      Social History   Socioeconomic History  . Marital status: Married    Spouse name: Not on file  . Number of children: Not on file  . Years of education: Not on file  . Highest education level: 7th  grade  Occupational History  . Occupation: Retired    Comment: Owned his own [place].  Tobacco Use  . Smoking status: Former Smoker    Packs/day: 1.75    Years: 20.00    Pack years: 35.00    Types:  Cigarettes    Start date: 1955    Quit date: 1975    Years since quitting: 46.6  . Smokeless tobacco: Former Network engineer  . Vaping Use: Never used  Substance and Sexual Activity  . Alcohol use: No    Comment: previously drank heavily - quit 1979.  . Drug use: No  . Sexual activity: Never  Other Topics Concern  . Not on file  Social History Narrative   Working on transportation truck part time    Social Determinants of Radio broadcast assistant Strain: Low Risk   . Difficulty of Paying Living Expenses: Not very hard  Food Insecurity: No Food Insecurity  . Worried About Charity fundraiser in the Last Year: Never true  . Ran Out of Food in the Last Year: Never true  Transportation Needs: No Transportation Needs  . Lack of Transportation (Medical): No  . Lack of Transportation (Non-Medical): No  Physical Activity: Inactive  . Days of Exercise per Week: 0 days  . Minutes of Exercise per Session: 0 min  Stress: No Stress Concern Present  . Feeling of Stress : Not at all  Social Connections: Moderately Integrated  . Frequency of Communication with Friends and Family: More than three times a week  . Frequency of Social Gatherings with Friends and Family: More than three times a week  . Attends Religious Services: More than 4 times per year  . Active Member of Clubs or Organizations: No  . Attends Archivist Meetings: Never  . Marital Status: Married  Human resources officer Violence: Not At Risk  . Fear of Current or Ex-Partner: No  . Emotionally Abused: No  . Physically Abused: No  . Sexually Abused: No      Family History  Problem Relation Age of Onset  . Brain cancer Father   . Diabetes Sister   . Heart attack Sister     Vitals:   11/04/19 1022  BP: 110/80  Pulse: 85  SpO2: 99%  Weight: 92.6 kg (204 lb 3.2 oz)   Lucas Readings from Last 3 Encounters:  11/04/19 92.6 kg (204 lb 3.2 oz)  10/20/19 92.4 kg (203 lb 12.8 oz)  10/08/19 91.2 kg (201 lb)    PHYSICAL EXAM: General:  Well appearing elderly male, ambulating w/ cane. No respiratory difficulty HEENT: normal Neck: supple. no JVD. Carotids 2+ bilat; no bruits. No lymphadenopathy or thyromegaly appreciated. Cor: PMI nondisplaced. Regular rate & rhythm. No rubs, gallops or murmurs. Lungs: clear Abdomen: soft, nontender, nondistended. No hepatosplenomegaly. No bruits or masses. Good bowel sounds. Extremities: no cyanosis, clubbing, rash, edema Neuro: alert & oriented x 3, cranial nerves grossly intact. moves all 4 extremities w/o difficulty. Affect pleasant.     ASSESSMENT & PLAN:  1. Chronic systolic HF: - likely combination of iCM/NICM - EF 30-35% echo 2/21 (was 35-40% in 6/20) - NYHA III, chronic, confounded by obesity, advanced age and deconditioning   - RHC 04/13/19 with elevated filling pressures but BNP normal  - Volume status/ Lucas stable. Most recent cardioMEMS transmission 2 days ago w/ DPAP at goal at 15 mmHg. He needs to improve compliance w/ CardioMEMS and send transmissions daily. We discussed this in detail  today.  - Continue torsemide to 60 mg daily - Continue hydral to 25 mg tid (will not titrate due to dizziness) - Continue Imdur 90 daily - Continue Toprol 25 daily (dose reduced due to trifascicular block) - Continue Losartan 25 mg daily  - Continue Jardiance 10 mg daily  - check BMP today     2. CAD - cath 04/13/19 LAD 70% with +FFR otherwise diffuse non-obstructive CAD  - S/p PCI/DES to LAD 1/21 - followed by Dr. Saunders Revel - stable w/o CP  - completed short course of triple therapy and recently taken off of Brilinta by general cardiology 08/2019.  -  He continues on ASA and coumadin. No abnormal bleeding or falls. Check CBC today   3. CKD Stage 3a - baseline SCr ~1.5  - check BMP today   4. DM  - continue Jardiance 10 mg daily   5. PAF - Zio patch 4/21 showed no AF. Mostly NSR w/ brief SVT/ NSVT, PACs and PVCs - Home sleep study has been ordered to r/o  OSA, pending  - Continue warfarin for a/c. Check CBC today   6. Rt Groin Wound Infection - post CardioMEMS implant - treated and followed by Dr. Saunders Revel. Completed course of cephalexin - now resolved   Long discussion regarding importance of daily CardioMEMS transmissions to allow [place] to closely monitor volume status in an effort to reduce risk for CHF exacerbations/ hospitalizations. He will start sending daily transmissions. Will follow reports weekly. F/u in clinic in 2 months w/ [name] or sooner if needed.   Lyda Jester, PA-C  12:04 PM

## 2019-11-04 NOTE — Telephone Encounter (Signed)
-----   Message from Consuelo Pandy, [place] sent at 11/04/2019  4:24 PM EDT ----- BNP (fluid marker) is mildly elevated). Increase Torsemide to 80 mg daily and increase KCl to 60 mEq daily. Repeat BMP and BNP in 1 week

## 2019-11-05 ENCOUNTER — Telehealth (HOSPITAL_COMMUNITY): Payer: Self-pay | Admitting: Cardiology

## 2019-11-05 NOTE — Progress Notes (Signed)
Patient Name: Telford Archambeau        DOB: [birth year]      Height: 5'4"    Weight: 39  Office Name:[facility]         Referring Provider: Gale Journey Bensimhon, MD    STOP BANG RISK ASSESSMENT S (snore) Have you been told that you snore?     YES   T (tired) Are you often tired, fatigued, or sleepy during the day?   YES  O (obstruction) Do you stop breathing, choke, or gasp during sleep? NO   P (pressure) Do you have or are you being treated for high blood pressure? YES   B (BMI) Is your body index greater than 35 kg/m? YES   A (age) Are you 86 years old or older? YES   N (neck) Do you have a neck circumference greater than 16 inches?   NO   G (gender) Are you a male? YES   TOTAL STOP/BANG "YES" ANSWERS                                                                        For Office Use Only              Procedure Order Form    YES to 3+ Stop Bang questions OR two clinical symptoms - patient qualifies for WatchPAT (CPT 95800)     Submit: This Form + Patient Face Sheet + Clinical Note via CloudPAT or Fax: [phone]         Clinical Notes: Will consult Sleep Specialist and refer for management of therapy due to patient increased risk of Sleep Apnea. Ordering a sleep study due to the following two clinical symptoms:  / Loud snoring R06.83 /  History of high blood pressure R03.0   I understand that I am proceeding with a home sleep apnea test as ordered by my treating physician. I understand that untreated sleep apnea is a serious cardiovascular risk factor and it is my responsibility to perform the test and seek management for sleep apnea. I will be contacted with the results and be managed for sleep apnea by a local sleep physician. I will be receiving equipment and further instructions from [facility]. I shall promptly ship back the equipment via the included mailing label. I understand my insurance will be billed for the test and as the  patient I am responsible for any insurance related out-of-pocket costs incurred. I have been provided with written instructions and can call for additional video or telephonic instruction, with 24-hour availability of qualified personnel to answer any questions: Patient Help Desk [phone].  Patient Signature ______________________________________________________   Date______________________ Patient Telemedicine Verbal Consent

## 2019-11-05 NOTE — Telephone Encounter (Signed)
Order, OV note, stop bang and demographics all faxed to [facility] at [phone]  

## 2019-11-05 NOTE — Addendum Note (Signed)
Encounter addended by: Kerry Dory, CMA on: 11/05/2019 9:34 AM  Actions taken: Visit diagnoses modified, Order list changed, Diagnosis association updated, Clinical Note Signed

## 2019-11-08 ENCOUNTER — Encounter: Payer: Medicare HMO | Attending: Internal Medicine

## 2019-11-08 ENCOUNTER — Ambulatory Visit: Payer: Medicare HMO | Admitting: Physician Assistant

## 2019-11-08 DIAGNOSIS — E119 Type 2 diabetes mellitus without complications: Secondary | ICD-10-CM | POA: Insufficient documentation

## 2019-11-08 DIAGNOSIS — E785 Hyperlipidemia, unspecified: Secondary | ICD-10-CM | POA: Insufficient documentation

## 2019-11-08 DIAGNOSIS — Z794 Long term (current) use of insulin: Secondary | ICD-10-CM | POA: Insufficient documentation

## 2019-11-08 DIAGNOSIS — I5042 Chronic combined systolic (congestive) and diastolic (congestive) heart failure: Secondary | ICD-10-CM | POA: Insufficient documentation

## 2019-11-08 DIAGNOSIS — Z79899 Other long term (current) drug therapy: Secondary | ICD-10-CM | POA: Insufficient documentation

## 2019-11-08 DIAGNOSIS — K219 Gastro-esophageal reflux disease without esophagitis: Secondary | ICD-10-CM | POA: Insufficient documentation

## 2019-11-08 DIAGNOSIS — N400 Enlarged prostate without lower urinary tract symptoms: Secondary | ICD-10-CM | POA: Insufficient documentation

## 2019-11-08 DIAGNOSIS — E039 Hypothyroidism, unspecified: Secondary | ICD-10-CM | POA: Insufficient documentation

## 2019-11-08 DIAGNOSIS — Z87891 Personal history of nicotine dependence: Secondary | ICD-10-CM | POA: Insufficient documentation

## 2019-11-08 DIAGNOSIS — I110 Hypertensive heart disease with heart failure: Secondary | ICD-10-CM | POA: Insufficient documentation

## 2019-11-08 DIAGNOSIS — Z7982 Long term (current) use of aspirin: Secondary | ICD-10-CM | POA: Insufficient documentation

## 2019-11-08 DIAGNOSIS — I480 Paroxysmal atrial fibrillation: Secondary | ICD-10-CM | POA: Insufficient documentation

## 2019-11-08 DIAGNOSIS — Z7901 Long term (current) use of anticoagulants: Secondary | ICD-10-CM | POA: Insufficient documentation

## 2019-11-09 ENCOUNTER — Telehealth: Payer: Self-pay | Admitting: Internal Medicine

## 2019-11-09 NOTE — Telephone Encounter (Signed)
The patient was seen by the CHF clinic in [place] on 11/04/19. He does of torsemide was increased by Ellen Henri, PA on at that time.   Forwarding this message to the CHF clinic for further recommendations.

## 2019-11-09 NOTE — Telephone Encounter (Signed)
Pt c/o medication issue:  1. Name of Medication: furosemide   2. How are you currently taking this medication (dosage and times per day)? 3 in am 1 in pm   3. Are you having a reaction (difficulty breathing--STAT)? No   4. What is your medication issue?  Per wife this med was recently increased and now he cannot hold his water.  Patient is getting clothing wet and doesn't like this   Please call to discuss.

## 2019-11-10 ENCOUNTER — Ambulatory Visit: Payer: Medicare HMO | Admitting: Family Medicine

## 2019-11-10 NOTE — Telephone Encounter (Signed)
[phone] (M No answer, unable to leave message as VM full

## 2019-11-11 ENCOUNTER — Other Ambulatory Visit (INDEPENDENT_AMBULATORY_CARE_PROVIDER_SITE_OTHER): Payer: Medicare HMO

## 2019-11-11 ENCOUNTER — Other Ambulatory Visit: Payer: Self-pay

## 2019-11-11 DIAGNOSIS — I25118 Atherosclerotic heart disease of native coronary artery with other forms of angina pectoris: Secondary | ICD-10-CM | POA: Diagnosis not present

## 2019-11-11 DIAGNOSIS — I5022 Chronic systolic (congestive) heart failure: Secondary | ICD-10-CM

## 2019-11-11 MED ORDER — TORSEMIDE 20 MG PO TABS: 60.0000 mg | ORAL_TABLET | Freq: Every day | ORAL | 2 refills | Status: DC

## 2019-11-11 NOTE — Telephone Encounter (Signed)
Pt and patients wife aware and voiced understanding

## 2019-11-12 LAB — BASIC METABOLIC PANEL
BUN/Creatinine Ratio: 16 (ref 10–24)
BUN: 32 mg/dL — ABNORMAL HIGH (ref 8–27)
CO2: 15 mmol/L — ABNORMAL LOW (ref 20–29)
Calcium: 9.5 mg/dL (ref 8.6–10.2)
Chloride: 95 mmol/L — ABNORMAL LOW (ref 96–106)
Creatinine, Ser: 1.96 mg/dL — ABNORMAL HIGH (ref 0.76–1.27)
GFR calc Af Amer: 36 mL/min/{1.73_m2} — ABNORMAL LOW (ref >59)
GFR calc non Af Amer: 31 mL/min/{1.73_m2} — ABNORMAL LOW (ref >59)
Glucose: 180 mg/dL — ABNORMAL HIGH (ref 65–99)
Potassium: 5.6 mmol/L — ABNORMAL HIGH (ref 3.5–5.2)
Sodium: 135 mmol/L (ref 134–144)

## 2019-11-15 NOTE — Patient Instructions (Signed)
Discharge Patient Instructions  Patient Details  Name: Lucas Caldwell MRN: [id number] Date of Birth: 02-17-1934 Referring Provider:  Jolaine Artist, MD    Number of Visits: 44  Reason for Discharge:  Patient reached a stable level of exercise.  Smoking History:  Social History   Tobacco Use  Smoking Status Former Smoker  . Packs/day: 1.75  . Years: 20.00  . Pack years: 35.00  . Types: Cigarettes  . Start date: 27  . Quit date: 18  . Years since quitting: 46.6  Smokeless Tobacco Former User    Diagnosis:  Chronic combined systolic and diastolic congestive heart failure (HCC)  Initial Exercise Prescription:  Initial Exercise Prescription - 05/20/19 1500      Date of Initial Exercise RX and Referring Provider   Date 05/20/19    Referring Provider Bensimhon      Treadmill   MPH 1    Grade 0    Minutes 15      REL-XR   Level 1    Speed 50    Minutes 15    METs 1      Prescription Details   Frequency (times per week) 3    Duration Progress to 30 minutes of continuous aerobic without signs/symptoms of physical distress      Intensity   THRR 40-80% of Max Heartrate 111-128    Ratings of Perceived Exertion 11-13    Perceived Dyspnea 0-4      Resistance Training   Training Prescription Yes    Weight 3 lb    Reps 10-15           Discharge Exercise Prescription (Final Exercise Prescription Changes):  Exercise Prescription Changes - 11/10/19 1300      Response to Exercise   Blood Pressure (Admit) 124/78    Blood Pressure (Exercise) 118/60    Heart Rate (Admit) 80 bpm    Heart Rate (Exercise) 112 bpm    Heart Rate (Exit) 108 bpm    Rating of Perceived Exertion (Exercise) 11    Symptoms none    Duration Continue with 30 min of aerobic exercise without signs/symptoms of physical distress.    Intensity THRR unchanged      Progression   Progression Continue to progress workloads to maintain intensity without signs/symptoms of physical  distress.    Average METs 2.5      Resistance Training   Training Prescription Yes    Weight 3 lb    Reps 10-15      Interval Training   Interval Training No      Recumbant Bike   Level 5    Watts 22    Minutes 15    METs 2      Home Exercise Plan   Plans to continue exercise at Home (comment)   walking   Frequency Add 2 additional days to program exercise sessions.    Initial Home Exercises Provided 10/25/19           Functional Capacity:  6 Minute Walk    Row Name 05/20/19 1539 10/27/19 1538       6 Minute Walk   Phase -- Discharge    Distance 715 feet 813 feet    Distance % Change -- 13.7 %    Distance Feet Change -- 98 ft    Walk Time 5.35 minutes 4.8 minutes    # of Rest Breaks 1 1    MPH 1.54 1.92    METS 1.14 1.54  RPE 15 16    Perceived Dyspnea  2 3    VO2 Peak 4 5.39    Symptoms Yes (comment) Yes (comment)    Comments legs "going to sleep" from knees down SOB- resolved after rest    Resting HR 95 bpm 76 bpm    Resting BP 114/70 140/70    Resting Oxygen Saturation  98 % 98 %    Exercise Oxygen Saturation  during 6 min walk 92 % 98 %    Max Ex. HR 107 bpm 109 bpm    Max Ex. BP 154/68 168/76    2 Minute Post BP 124/68 --           Nutrition & Weight - Outcomes:  Pre Biometrics - 05/20/19 1545      Pre Biometrics   Height 5\' 7"  (1.702 m)    Weight 206 lb (93.4 kg)    BMI (Calculated) 32.26          Nutrition:  Nutrition Therapy & Goals - 05/24/19 0904      Nutrition Therapy   Diet HH, Low Na, DM    Drug/Food Interactions Coumadin/Vit K    Protein (specify units) 80-85g    Fiber 30 grams    Whole Grain Foods 3 servings    Saturated Fats 12 max. grams    Fruits and Vegetables 5 servings/day    Sodium 1.5 grams      Personal Nutrition Goals   Nutrition Goal ST: have one protein food at each meal. Small frequent meals. LT: improving SOB and DOE. 4/10    Comments Pt reports needing help. SOB with eating. Not much appetite. eggs and  grits in am. turnip greens and green beans. Canned peaches. Discussed protein sources with wife and pt. Discussed how right now the focus is on protein and calorie needs as pt is not eating due to SOB, but discussed some HH and DM options as well. Wife goignt o try BBG Chicken. Pt reports not eating many foods high in protein. Fried chicken legs last night with some peaches, BG this morning was 188, usually around 130-150. BG stable throught the day. Discussed HH, DM friendly eating. Discussed protein and calorie needs and how to meet them with SOB such as small frequent meals. Hard for pt to hear, will talk more during class time in person.      Intervention Plan   Intervention Prescribe, educate and counsel regarding individualized specific dietary modifications aiming towards targeted core components such as weight, hypertension, lipid management, diabetes, heart failure and other comorbidities.;Nutrition handout(s) given to patient.    Expected Outcomes Short Term Goal: Understand basic principles of dietary content, such as calories, fat, sodium, cholesterol and nutrients.;Short Term Goal: A plan has been developed with personal nutrition goals set during dietitian appointment.;Long Term Goal: Adherence to prescribed nutrition plan.           Goals reviewed with patient; copy given to patient.

## 2019-11-16 ENCOUNTER — Telehealth: Payer: Self-pay

## 2019-11-16 NOTE — Telephone Encounter (Signed)
No answer - called cell number and he asked me to call home number with no answer

## 2019-11-17 ENCOUNTER — Other Ambulatory Visit: Payer: Self-pay

## 2019-11-17 ENCOUNTER — Other Ambulatory Visit: Payer: Self-pay | Admitting: Family Medicine

## 2019-11-17 ENCOUNTER — Telehealth: Payer: Self-pay

## 2019-11-17 ENCOUNTER — Encounter: Payer: Medicare HMO | Attending: Internal Medicine | Admitting: *Deleted

## 2019-11-17 DIAGNOSIS — I5042 Chronic combined systolic (congestive) and diastolic (congestive) heart failure: Secondary | ICD-10-CM | POA: Diagnosis not present

## 2019-11-17 DIAGNOSIS — E039 Hypothyroidism, unspecified: Secondary | ICD-10-CM | POA: Diagnosis not present

## 2019-11-17 DIAGNOSIS — Z7982 Long term (current) use of aspirin: Secondary | ICD-10-CM | POA: Insufficient documentation

## 2019-11-17 DIAGNOSIS — Z7901 Long term (current) use of anticoagulants: Secondary | ICD-10-CM | POA: Insufficient documentation

## 2019-11-17 DIAGNOSIS — Z794 Long term (current) use of insulin: Secondary | ICD-10-CM | POA: Insufficient documentation

## 2019-11-17 DIAGNOSIS — I110 Hypertensive heart disease with heart failure: Secondary | ICD-10-CM | POA: Diagnosis not present

## 2019-11-17 DIAGNOSIS — Z87891 Personal history of nicotine dependence: Secondary | ICD-10-CM | POA: Insufficient documentation

## 2019-11-17 DIAGNOSIS — K219 Gastro-esophageal reflux disease without esophagitis: Secondary | ICD-10-CM | POA: Insufficient documentation

## 2019-11-17 DIAGNOSIS — Z79899 Other long term (current) drug therapy: Secondary | ICD-10-CM | POA: Diagnosis not present

## 2019-11-17 DIAGNOSIS — I480 Paroxysmal atrial fibrillation: Secondary | ICD-10-CM | POA: Insufficient documentation

## 2019-11-17 DIAGNOSIS — N400 Enlarged prostate without lower urinary tract symptoms: Secondary | ICD-10-CM | POA: Insufficient documentation

## 2019-11-17 DIAGNOSIS — I5022 Chronic systolic (congestive) heart failure: Secondary | ICD-10-CM

## 2019-11-17 DIAGNOSIS — E119 Type 2 diabetes mellitus without complications: Secondary | ICD-10-CM | POA: Diagnosis not present

## 2019-11-17 DIAGNOSIS — I5040 Unspecified combined systolic (congestive) and diastolic (congestive) heart failure: Secondary | ICD-10-CM | POA: Diagnosis present

## 2019-11-17 DIAGNOSIS — E785 Hyperlipidemia, unspecified: Secondary | ICD-10-CM | POA: Diagnosis not present

## 2019-11-17 NOTE — Progress Notes (Signed)
Cardiac Individual Treatment Plan  Patient Details  Name: Lucas Caldwell MRN: [id number] Date of Birth: 1933-12-27 Referring Provider:     Cardiac Rehab from 05/20/2019 in [facility] Cardiac and Pulmonary Rehab  Referring Provider Bensimhon      Initial Encounter Date:    Cardiac Rehab from 05/20/2019 in [facility] Cardiac and Pulmonary Rehab  Date 05/20/19      Visit Diagnosis: Chronic combined systolic and diastolic congestive heart failure ([place])  Heart failure, chronic systolic ([place])  Patient's Home Medications on Admission:  Current Outpatient Medications:    ACCU-CHEK AVIVA PLUS test strip, Use to check blood sugar up to 2 times daily, Disp: 200 each, Rfl: 3   Accu-Chek Softclix Lancets lancets, Use to check blood sugar up to 2 times daily., Disp: 200 each, Rfl: 3   acetaminophen (TYLENOL) 500 MG tablet, Take 1,000 mg by mouth every 6 (six) hours as needed for moderate pain or headache. , Disp: , Rfl:    Alcohol Swabs 70 % PADS, Use when checking blood sugar up to 3x daily, Disp: 100 each, Rfl: 11   allopurinol (ZYLOPRIM) 100 MG tablet, Take 1 tablet (100 mg total) by mouth daily., Disp: 90 tablet, Rfl: 3   aspirin 81 MG chewable tablet, Chew 81 mg by mouth daily., Disp: , Rfl:    Blood Glucose Monitoring Suppl (ACCU-CHEK AVIVA PLUS) w/Device KIT, Use to check blood sugar up to 2 times daily, Disp: 1 kit, Rfl: 0   bosutinib (BOSULIF) 100 MG tablet, Take 100 mg by mouth at bedtime as needed (tired/not feeling well). Take with food. , Disp: , Rfl:    Dulaglutide (TRULICITY) 1.58 XE/9.4MH SOPN, Inject 0.75 mg into the skin once a week. (Patient taking differently: Inject 0.75 mg into the skin every Saturday. ), Disp: 4 pen, Rfl: 0   empagliflozin (JARDIANCE) 10 MG TABS tablet, Take 10 mg by mouth daily before breakfast., Disp: 90 tablet, Rfl: 3   gabapentin (NEURONTIN) 300 MG capsule, TAKE TWO CAPSULES BY MOUTH TWICE DAILY. (MAX DOSE = 1200MG DAILY FOR KIDNEY FUNCTION), Disp: 360  capsule, Rfl: 1   hydrALAZINE (APRESOLINE) 25 MG tablet, Take 1 tablet (25 mg total) by mouth 3 (three) times daily., Disp: 270 tablet, Rfl: 3   Insulin Glargine ([name] KWIKPEN) 100 UNIT/ML SOPN, Inject 38 Units into the skin daily. , Disp: , Rfl:    Insulin Pen Needle 31G X 5 MM MISC, 31 g by Does not apply route as directed., Disp: 100 each, Rfl: 5   isosorbide mononitrate (IMDUR) 60 MG 24 hr tablet, Take 1 tablet (60 mg total) by mouth daily., Disp: 135 tablet, Rfl: 2   levothyroxine (SYNTHROID) 25 MCG tablet, Take 1 tablet (25 mcg total) by mouth daily before breakfast., Disp: 90 tablet, Rfl: 1   losartan (COZAAR) 25 MG tablet, Take 1 tablet (25 mg total) by mouth daily., Disp: 90 tablet, Rfl: 1   Menthol-Zinc Oxide (GOLD BOND EX), Apply 1 application topically daily as needed (muscle pain)., Disp: , Rfl:    metoprolol succinate (TOPROL-XL) 25 MG 24 hr tablet, Take 1 tablet (25 mg total) by mouth daily., Disp: 90 tablet, Rfl: 1   Multiple Vitamin (MULTIVITAMIN WITH MINERALS) TABS tablet, Take 1 tablet by mouth daily., Disp: , Rfl:    nitroGLYCERIN (NITROSTAT) 0.4 MG SL tablet, Place 1 tablet (0.4 mg total) under the tongue every 5 (five) minutes x 3 doses as needed for chest pain. (Patient not taking: Reported on 11/04/2019), Disp: 25 tablet, Rfl: 4  omeprazole (PRILOSEC) 20 MG capsule, Take 20 mg by mouth in the morning and at bedtime. Morning & afternoon, Disp: , Rfl:    Polyethyl Glycol-Propyl Glycol (SYSTANE OP), Place 1 drop into both eyes 3 (three) times daily as needed (dry/irritated eyes.). , Disp: , Rfl:    polyethylene glycol (MIRALAX / GLYCOLAX) 17 g packet, Take 17 g by mouth daily as needed for mild constipation. , Disp: , Rfl:    potassium chloride SA (KLOR-CON) 20 MEQ tablet, Take 3 tablets (60 mEq total) by mouth daily., Disp: 180 tablet, Rfl: 1   PROVENTIL HFA 108 (90 Base) MCG/ACT inhaler, Inhale 2 puffs into the lungs every 6 (six) hours as needed for wheezing  or shortness of breath., Disp: , Rfl:    ranolazine (RANEXA) 500 MG 12 hr tablet, Take 1 tablet (500 mg total) by mouth 2 (two) times daily., Disp: 180 tablet, Rfl: 2   rosuvastatin (CRESTOR) 5 MG tablet, Take 1 tablet (5 mg total) by mouth daily. (Patient taking differently: Take 5 mg by mouth every evening. ), Disp: 90 tablet, Rfl: 3   torsemide (DEMADEX) 20 MG tablet, Take 3 tablets (60 mg total) by mouth daily., Disp: 90 tablet, Rfl: 2   traMADol (ULTRAM) 50 MG tablet, Take 1-2 tablets (50-100 mg total) by mouth at bedtime as needed. (Patient taking differently: Take 50-100 mg by mouth at bedtime as needed (pain.). ), Disp: 30 tablet, Rfl: 2   warfarin (COUMADIN) 3 MG tablet, TAKE 1 TO 1 & 1/2 TABLETS ONCE A DAY AS DIRECTED BY THE COUMADIN CLINIC, Disp: 120 tablet, Rfl: 0  Past Medical History: Past Medical History:  Diagnosis Date   Arthritis    "probably in his legs" (08/15/2016)   Blind right eye    BPH (benign prostatic hyperplasia)    Breast asymmetry    Left breast is larger, present for several years.   CAD (coronary artery disease)    a. Cath in the late 90's - reportedly ok;  b. 2014 s/p stenting x 2 @ [facility]; c 08/19/16 Cath/PCI with DES -> RCA, plan to treat LM 70% medically. Seen by surgery and felt to be too high risk for CABG; d. 08/2018 Cath: LM 70 (iFR 0.86), LAD 20ost, 40p, 50/36m D1 70, LCX patent stent, OM1 30, RCA patent stent, 429mSR, 40d, RPDA 30. PCWP 8. CO/CI 4.0/2.1; e. 03/2019 PCI to LAD (2.5x15 Resolute Onyx DES).   Chronic combined systolic (congestive) and diastolic (congestive) heart failure ([place]   a. Previously reduced EF-->50% by echo in 2012;  b. 06/2015 Echo: EF 50-55%  c. 07/2016 Echo: EF 45-50%; d. 12/2017 Echo: EF 55%, Gr1 DD, mild MR; e. 08/2018 Echo: EF 35-40%, mildly dil PA; f. 04/2019 Echo: EF 30-35%, glob HK, nl RV fxn; g. s/p Cardiomems.   Chronic kidney disease (CKD), stage IV (severe) (HCC)    CML (chronic myelocytic leukemia) (HCC)    GERD  (gastroesophageal reflux disease)    GIB (gastrointestinal bleeding)    a. 12/2017 3 unit PRBC GIB in setting of coumadin-->Endo/colon multiple duodenal ulcers and a single bleeding ulcer in the proximal ascending colon status post hemostatic clipping x2.   Gout    Hyperlipemia    Hypertension    Hypothyroidism    Iron deficiency anemia    Ischemic cardiomyopathy    a. Previously reduced EF-->50% by echo in 2012;  b. 06/2015 Echo: EF 50-55%, Gr2 DD, mild MR, mildly dil LA, Ao sclerosis, mild TR;  c. 07/2016  Echo: EF 45-50%; d. 12/2017 Echo: EF 55%, Gr1 DD, mild MR; e. 08/2018 Echo: EF 35-40%; f. 04/2019 Echo: EF 30-35%.   Migraine    "in the 1960s" (08/15/2016)   PAF (paroxysmal atrial fibrillation) (HCC)    a. ? Dx 2014-->s/p DCCV;  b. CHA2DS2VASc = 6--> Coumadin.   Prostate cancer ([place])    RBBB    Type II diabetes mellitus ([place])     Tobacco Use: Social History   Tobacco Use  Smoking Status Former Smoker   Packs/day: 1.75   Years: 20.00   Pack years: 35.00   Types: Cigarettes   Start date: 77   Quit date: 1975   Years since quitting: 46.6  Smokeless Tobacco Former Systems developer    Labs: Recent [employer]chant [employer] for IT[employer] Cardiac and Pulmonary Rehab Latest Ref Rng & Units 02/02/2019 04/13/2019 09/08/2019 09/23/2019 09/23/2019   Cholestrol 0 - 200 mg/dL - - - - -   LDLCALC 0 - 99 mg/dL - - - - -   HDL >40 mg/dL - - - - -   Trlycerides <150 mg/dL - - - - -   Hemoglobin A1c 4.0 - 5.6 % 6.6(A) 7.6(H) 7.1(A) - -   HCO3 20.0 - 28.0 mmol/L - - - 29.6(H) 27.4   TCO2 22 - 32 mmol/L - - - 31 29   O2SAT % - - - 72.0 71.0       Exercise Target Goals: Exercise Program Goal: Individual exercise prescription set using results from initial 6 min walk test and THRR while considering  patients activity barriers and safety.   Exercise Prescription Goal: Initial exercise prescription builds to 30-45 minutes a day of aerobic activity, 2-3 days per week.  Home exercise  guidelines will be given to patient during program as part of exercise prescription that the participant will acknowledge.   Education: Aerobic Exercise & Resistance Training: - Gives group verbal and written instruction on the various components of exercise. Focuses on aerobic and resistive training programs and the benefits of this training and how to safely progress through these programs..   Education: Exercise & Equipment Safety: - Individual verbal instruction and demonstration of equipment use and safety with use of the equipment.   Cardiac Rehab from 11/17/2019 in [facility] Cardiac and Pulmonary Rehab  Date 05/20/19  Educator AS  Instruction Review Code 1- Verbalizes Understanding      Education: Exercise Physiology & General Exercise Guidelines: - Group verbal and written instruction with models to review the exercise physiology of the cardiovascular system and associated critical values. Provides general exercise guidelines with specific guidelines to those with heart or lung disease.    Education: Flexibility, Balance, Mind/Body Relaxation: Provides group verbal/written instruction on the benefits of flexibility and balance training, including mind/body exercise modes such as yoga, pilates and tai chi.  Demonstration and skill practice provided.   Cardiac Rehab from 11/17/2019 in [facility] Cardiac and Pulmonary Rehab  Date 11/17/19  Educator AS  Instruction Review Code 1- Verbalizes Understanding      Activity Barriers & Risk Stratification:   6 Minute Walk:  6 Minute Walk    Row Name 10/27/19 1538         6 Minute Walk   Phase Discharge     Distance 813 feet     Distance % Change 13.7 %     Distance Feet Change 98 ft     Walk Time 4.8 minutes     # of Rest  Breaks 1     MPH 1.92     METS 1.54     RPE 16     Perceived Dyspnea  3     VO2 Peak 5.39     Symptoms Yes (comment)     Comments SOB- resolved after rest     Resting HR 76 bpm     Resting BP 140/70     Resting  Oxygen Saturation  98 %     Exercise Oxygen Saturation  during 6 min walk 98 %     Max Ex. HR 109 bpm     Max Ex. BP 168/76            Oxygen Initial Assessment:   Oxygen Re-Evaluation:   Oxygen Discharge (Final Oxygen Re-Evaluation):   Initial Exercise Prescription:   Perform Capillary Blood Glucose checks as needed.  Exercise Prescription Changes:  Exercise Prescription Changes    Row Name 06/09/19 1100 06/22/19 1500 07/07/19 1100 07/20/19 1500 08/04/19 0800     Response to Exercise   Blood Pressure (Admit) 110/70 140/82 130/64 126/64 122/80   Blood Pressure (Exercise) 120/80 130/70 140/72 128/74 140/64   Blood Pressure (Exit) 122/82 124/58 124/62 114/68 122/72   Heart Rate (Admit) 56 bpm 87 bpm 95 bpm 94 bpm 94 bpm   Heart Rate (Exercise) 126 bpm 116 bpm 127 bpm 116 bpm 114 bpm   Heart Rate (Exit) 109 bpm 95 bpm 111 bpm 97 bpm 99 bpm   Rating of Perceived Exertion (Exercise) '11 12 12 12 13   ' Symptoms none none none none none   Comments second full day of exericse -- -- -- --   Duration Progress to 30 minutes of  aerobic without signs/symptoms of physical distress Progress to 30 minutes of  aerobic without signs/symptoms of physical distress Progress to 30 minutes of  aerobic without signs/symptoms of physical distress Progress to 30 minutes of  aerobic without signs/symptoms of physical distress Continue with 30 min of aerobic exercise without signs/symptoms of physical distress.   Intensity THRR unchanged THRR unchanged THRR unchanged THRR unchanged THRR unchanged     Progression   Progression Continue to progress workloads to maintain intensity without signs/symptoms of physical distress. Continue to progress workloads to maintain intensity without signs/symptoms of physical distress. Continue to progress workloads to maintain intensity without signs/symptoms of physical distress. Continue to progress workloads to maintain intensity without signs/symptoms of physical  distress. Continue to progress workloads to maintain intensity without signs/symptoms of physical distress.   Average METs 2 1.3 2.49 2.17 2.1     Resistance Training   Training Prescription Yes Yes Yes Yes Yes   Weight 3 lb  3 lb  3 lb 4 lb 4 lb   Reps 10-15 10-15 10-15 10-15 10-15     Interval Training   Interval Training No No No No No     Treadmill   MPH 1.3 -- 1.4 1.4 --   Grade 0 -- 0.5 0.5 --   Minutes 15 -- 15 15 --   METs 2 -- 2.17 2.17 --     NuStep   Level -- -- -- -- 3   Minutes -- -- -- -- 30   METs -- -- -- -- 2.1     REL-XR   Level '1 1 1 1 ' --   Speed -- 50 -- 50 --   Minutes '15 15 15 15 ' --   METs 2 1.3 2.2 -- --     T5 Nustep  Level -- -- 2 -- --   Minutes -- -- 15 -- --   METs -- -- 3.1 -- --   Row Name 08/18/19 1300 09/14/19 1400 09/27/19 1400 10/13/19 1200 10/25/19 1700     Response to Exercise   Blood Pressure (Admit) 132/66 132/74 126/80 138/82 --   Blood Pressure (Exercise) 146/70 136/70 148/80 150/58 --   Blood Pressure (Exit) 140/76 124/66 116/74 122/76 --   Heart Rate (Admit) 111 bpm 98 bpm 84 bpm 86 bpm --   Heart Rate (Exercise) 119 bpm 118 bpm 109 bpm 120 bpm --   Heart Rate (Exit) 95 bpm 106 bpm 95 bpm 113 bpm --   Rating of Perceived Exertion (Exercise) '15 12 11 13 ' --   Symptoms none none none none --   Duration Continue with 30 min of aerobic exercise without signs/symptoms of physical distress. Progress to 30 minutes of  aerobic without signs/symptoms of physical distress Continue with 30 min of aerobic exercise without signs/symptoms of physical distress. Continue with 30 min of aerobic exercise without signs/symptoms of physical distress. --   Intensity THRR unchanged THRR unchanged THRR unchanged THRR unchanged --     Progression   Progression Continue to progress workloads to maintain intensity without signs/symptoms of physical distress. Continue to progress workloads to maintain intensity without signs/symptoms of physical distress.  Continue to progress workloads to maintain intensity without signs/symptoms of physical distress. Continue to progress workloads to maintain intensity without signs/symptoms of physical distress. --   Average METs 2.3 2.53 2.6 2.15 --     Resistance Training   Training Prescription Yes Yes Yes -- --   Weight 4 lb 4 lb 3 lb -- --   Reps 10-15 10-15 10-15 -- --     Interval Training   Interval Training No No No -- --     Treadmill   MPH 1.6 1.7 -- -- --   Grade 0.5 0.5 -- -- --   Minutes 15 15 -- -- --   METs 2.17 2.42 -- -- --     Recumbant Bike   Level -- 1 3 -- --   Watts -- 25 20 -- --   Minutes -- 15 15 -- --   METs -- -- 2.9 -- --     NuStep   Level '3 1 2 ' -- --   SPM 80 -- -- -- --   Minutes '15 15 15 ' -- --   METs 2.4 2.8 2.5 -- --     T5 Nustep   Level -- 3 -- -- --   Minutes -- 15 -- -- --   METs -- 2 -- -- --     Home Exercise Plan   Plans to continue exercise at -- -- -- -- Home (comment)  walking   Frequency -- -- -- -- Add 2 additional days to program exercise sessions.   Initial Home Exercises Provided -- -- -- -- 10/25/19   Row Name 10/27/19 1400 11/10/19 1300           Response to Exercise   Blood Pressure (Admit) 128/74 124/78      Blood Pressure (Exercise) 130/66 118/60      Blood Pressure (Exit) 124/74 --      Heart Rate (Admit) 95 bpm 80 bpm      Heart Rate (Exercise) 111 bpm 112 bpm      Heart Rate (Exit) 97 bpm 108 bpm      Rating of Perceived Exertion (Exercise) 12  11      Symptoms none none      Duration Continue with 30 min of aerobic exercise without signs/symptoms of physical distress. Continue with 30 min of aerobic exercise without signs/symptoms of physical distress.      Intensity THRR unchanged THRR unchanged        Progression   Progression Continue to progress workloads to maintain intensity without signs/symptoms of physical distress. Continue to progress workloads to maintain intensity without signs/symptoms of physical distress.       Average METs 2.5 2.5        Resistance Training   Training Prescription Yes Yes      Weight 3 lb 3 lb      Reps 10-15 10-15        Interval Training   Interval Training No No        Recumbant Bike   Level -- 5      Watts -- 22      Minutes -- 15      METs -- 2        NuStep   Level 4 --      Minutes 15 --      METs 2.5 --        Home Exercise Plan   Plans to continue exercise at Home (comment)  walking Home (comment)  walking      Frequency Add 2 additional days to program exercise sessions. Add 2 additional days to program exercise sessions.      Initial Home Exercises Provided 10/25/19 10/25/19             Exercise Comments:  Exercise Comments    Row Name 06/02/19 1353           Exercise Comments First full day of exercise!  Patient was oriented to gym and equipment including functions, settings, policies, and procedures.  Patient's individual exercise prescription and treatment plan were reviewed.  All starting workloads were established based on the results of the 6 minute walk test done at initial orientation visit.  The plan for exercise progression was also introduced and progression will be customized based on patient's performance and goals.              Exercise Goals and Review:   Exercise Goals Re-Evaluation :  Exercise Goals Re-Evaluation    Row Name 06/02/19 1353 06/09/19 1109 06/22/19 1553 06/30/19 1343 07/07/19 1131     Exercise Goal Re-Evaluation   Exercise Goals Review Able to understand and use rate of perceived exertion (RPE) scale;Understanding of Exercise Prescription;Knowledge and understanding of Target Heart Rate Range (THRR) Increase Physical Activity;Increase Strength and Stamina;Understanding of Exercise Prescription Increase Physical Activity;Increase Strength and Stamina;Able to understand and use rate of perceived exertion (RPE) scale;Able to understand and use Dyspnea scale;Knowledge and understanding of Target Heart Rate Range  (THRR);Able to check pulse independently;Understanding of Exercise Prescription Increase Physical Activity;Increase Strength and Stamina;Able to understand and use rate of perceived exertion (RPE) scale;Able to understand and use Dyspnea scale;Knowledge and understanding of Target Heart Rate Range (THRR);Able to check pulse independently;Understanding of Exercise Prescription Increase Physical Activity;Increase Strength and Stamina;Understanding of Exercise Prescription   Comments Reviewed RPE scale, THR and program prescription with pt today.  Pt voiced understanding and was given a copy of goals to take home. Lucas Caldwell is off to a good start in rehab.  He has completed his first two full days of exericse.  [name] now up to 1.3 mph on the treadmill.  We will continue to monitor his progress. Lucas Caldwell only did the XR today.  He did not feel up to the TM.  Staff will monitor progress Lucas Caldwell is now on the treadmill again. Lucas Caldwell reports walking at home for exercise; 1 mile 3x/week (RPE12). Lucas Caldwell is doing well in rehab.  [name] now up to 3.1 METs on the NuStep.  We will continue to monitor his progress.   Expected Outcomes Short: Use RPE daily to regulate intensity. Long: Follow program prescription in THR. Short: Continue to attend rehab regularly  Long: Continue to follow program prescription. Short: attend consistently Long: improve overall stamina Short: attend consistently, add some resistence at home Long: improve overall stamina Short: Continue to improve walking on treadmill  Long; Continue to improve stamina.   Lucas Caldwell Name 07/20/19 1536 08/02/19 1542 08/18/19 1316 09/14/19 1404 09/27/19 1411     Exercise Goal Re-Evaluation   Exercise Goals Review Increase Physical Activity;Increase Strength and Stamina;Able to understand and use rate of perceived exertion (RPE) scale;Able to understand and use Dyspnea scale;Knowledge and understanding of Target Heart Rate Range (THRR);Able to check pulse  independently;Understanding of Exercise Prescription Increase Physical Activity;Increase Strength and Stamina;Understanding of Exercise Prescription Increase Physical Activity;Increase Strength and Stamina;Understanding of Exercise Prescription Increase Physical Activity;Increase Strength and Stamina;Understanding of Exercise Prescription Increase Physical Activity;Increase Strength and Stamina;Understanding of Exercise Prescription   Comments Lucas Caldwell works in [employer] range when he attends.  he has increased to 4 lb weights for strength work Lucas Caldwell returned to rehab today after missing since 4/28.  He had been out after debriment of a diabetic ulcer.  He did well today on the equipment and particiapated in weights as well.  We will continue to monitor his progress. Lucas Caldwell has progressed well since returning.  He has increased speed to 1.6 on TM. Staff will monitor progress. Lucas Caldwell is doing well in rehab. [name] continuing to improve his exercise levels gradually. [name] at 1.7 on the TM. Will continue to monitor. Only attended once since last review.  He had a cath last week, waiting for clearance to return (note sent to MD).  He would do better with improved attendance.  We will continue to monitor his progress.  Also saw in chart that he wishes to discontinue his home oxygen therapy.   Expected Outcomes Short: continue to attend consistently Long:  improve stamina and MET level Short: Return to improved attendance Long: Continue to improve stamina. Short: attend class consistently Long: increase stamina Short: Maintain compliance with rehab classes Long: Improve MET level and strength and endurance Short: Regularly attendance in rehab  Long: Continue to improve stamina.   Lucas Caldwell Name 10/13/19 1257 10/25/19 1342 10/27/19 1358 11/10/19 1329       Exercise Goal Re-Evaluation   Exercise Goals Review Increase Physical Activity;Increase Strength and Stamina;Understanding of Exercise Prescription Increase Physical  Activity;Increase Strength and Stamina;Understanding of Exercise Prescription Increase Physical Activity;Increase Strength and Stamina;Understanding of Exercise Prescription Increase Physical Activity;Increase Strength and Stamina;Understanding of Exercise Prescription    Comments Today was patients first day back after extened absence.  Staff will monitor progress. Lucas Caldwell has been out for some time. Home exercise was reviewed with patient and was explained with reasons why and the benefits for continuous cardiovascular exercising at home. Patient stated he is able to walk at home. Reviewed home exercise with pt today.  Pt plans to walk for exercise.  Reviewed THR, pulse, RPE, sign and symptoms, pulse oximetery and when to call 911 or MD.  Also discussed weather considerations and indoor options.  Pt voiced understanding. Lucas Caldwell is nearing graduation.  [name] completing his post 6MWT today and we hope to see some improvement.  He plans to continue to walk for exercise after graduation.  He has worked his way up to level 4 on the NuStep.  We will continue to monitor his progress. Lucas Caldwell has not attended consistently this month.  Staff plan to have him graduate at his next session.  He did improve post 6MWT by 98 feet.    Expected Outcomes Short: get back to regular attendance Long: improve strength and stamina Short: Add in extra days at home for walking for exercise Long: Learn to exercise independently using RPE and THR at home Short: Improve post 6MWT Long: Continue to exercise independently Short:  complete HT program Long: maintain exercise on his own           Discharge Exercise Prescription (Final Exercise Prescription Changes):  Exercise Prescription Changes - 11/10/19 1300      Response to Exercise   Blood Pressure (Admit) 124/78    Blood Pressure (Exercise) 118/60    Heart Rate (Admit) 80 bpm    Heart Rate (Exercise) 112 bpm    Heart Rate (Exit) 108 bpm    Rating of Perceived Exertion  (Exercise) 11    Symptoms none    Duration Continue with 30 min of aerobic exercise without signs/symptoms of physical distress.    Intensity THRR unchanged      Progression   Progression Continue to progress workloads to maintain intensity without signs/symptoms of physical distress.    Average METs 2.5      Resistance Training   Training Prescription Yes    Weight 3 lb    Reps 10-15      Interval Training   Interval Training No      Recumbant Bike   Level 5    Watts 22    Minutes 15    METs 2      Home Exercise Plan   Plans to continue exercise at Home (comment)   walking   Frequency Add 2 additional days to program exercise sessions.    Initial Home Exercises Provided 10/25/19           Nutrition:  Target Goals: Understanding of nutrition guidelines, daily intake of sodium <159m, cholesterol <2059m calories 30% from fat and 7% or less from saturated fats, daily to have 5 or more servings of fruits and vegetables.  Education: Controlling Sodium/Reading Food Labels -Group verbal and written material supporting the discussion of sodium use in heart healthy nutrition. Review and explanation with models, verbal and written materials for utilization of the food label.   Education: General Nutrition Guidelines/Fats and Fiber: -Group instruction provided by verbal, written material, models and posters to present the general guidelines for heart healthy nutrition. Gives an explanation and review of dietary fats and fiber.   Biometrics:    Nutrition Therapy Plan and Nutrition Goals:  Nutrition Therapy & Goals - 05/24/19 0904      Nutrition Therapy   Diet HH, Low Na, DM    Drug/Food Interactions Coumadin/Vit K    Protein (specify units) 80-85g    Fiber 30 grams    Whole Grain Foods 3 servings    Saturated Fats 12 max. grams    Fruits and Vegetables 5 servings/day    Sodium 1.5 grams      Personal Nutrition Goals   Nutrition Goal ST: have one protein  food at  each meal. Small frequent meals. LT: improving SOB and DOE. 4/10    Comments Pt reports needing help. SOB with eating. Not much appetite. eggs and grits in am. turnip greens and green beans. Canned peaches. Discussed protein sources with wife and pt. Discussed how right now the focus is on protein and calorie needs as pt is not eating due to SOB, but discussed some HH and DM options as well. Wife goignt o try BBG Chicken. Pt reports not eating many foods high in protein. Fried chicken legs last night with some peaches, BG this morning was 188, usually around 130-150. BG stable throught the day. Discussed HH, DM friendly eating. Discussed protein and calorie needs and how to meet them with SOB such as small frequent meals. Hard for pt to hear, will talk more during class time in person.      Intervention Plan   Intervention Prescribe, educate and counsel regarding individualized specific dietary modifications aiming towards targeted core components such as weight, hypertension, lipid management, diabetes, heart failure and other comorbidities.;Nutrition handout(s) given to patient.    Expected Outcomes Short Term Goal: Understand basic principles of dietary content, such as calories, fat, sodium, cholesterol and nutrients.;Short Term Goal: A plan has been developed with personal nutrition goals set during dietitian appointment.;Long Term Goal: Adherence to prescribed nutrition plan.           Nutrition Assessments:   MEDIFICTS Score Key:          ?70 Need to make dietary changes          40-70 Heart Healthy Diet         ? 40 Therapeutic Level Cholesterol Diet  Nutrition Goals Re-Evaluation:  Nutrition Goals Re-Evaluation    Lucas Caldwell Name 06/10/19 1348 08/18/19 1409 09/09/19 1400         Goals   Current Weight -- -- 204 lb (92.5 kg)     Nutrition Goal ST: have one protein food at each meal. Small frequent meals. LT: improving SOB and DOE. 4/10 -- Have more of an appetite     Comment Pt's wife  [name] will make all of his meals for him. Wife reports making a protein  food at each meal, mostly chicken. clarified what foods are good sources of protein, wife voiced understanding. Reiterated importance of getting enough protein intake to pt and small frequent meals for his SOB. Pt reports still having SOB. Pt and wife did not recall initial RD consult, will continue to follow up. Armanie has been having more slamon and fish for protein. He states that when he is hungry that he is eating hot dogs or a hamburger. Informed him that he needs to work on his diet and focus on eating healthier foods that can help with his appetite.     Expected Outcome ST: have one protein food at each meal. Small frequent meals. LT: improving SOB and DOE. 4/10 Short: continue having quality lean protein Long:  maintain heart healthy eating Short: eat more  heart healthy foods and watch sodium intake. Long: maintain a healthy amount of sodium intake independently            Nutrition Goals Discharge (Final Nutrition Goals Re-Evaluation):  Nutrition Goals Re-Evaluation - 09/09/19 1400      Goals   Current Weight 204 lb (92.5 kg)    Nutrition Goal Have more of an appetite    Comment He states that when he is hungry that he is eating hot  dogs or a hamburger. Informed him that he needs to work on his diet and focus on eating healthier foods that can help with his appetite.    Expected Outcome Short: eat more  heart healthy foods and watch sodium intake. Long: maintain a healthy amount of sodium intake independently           Psychosocial: Target Goals: Acknowledge presence or absence of significant depression and/or stress, maximize coping skills, provide positive support system. Participant is able to verbalize types and ability to use techniques and skills needed for reducing stress and depression.   Education: Depression - Provides group verbal and written instruction on the correlation between heart/lung disease  and depressed mood, treatment options, and the stigmas associated with seeking treatment.   Education: Sleep Hygiene -Provides group verbal and written instruction about how sleep can affect your health.  Define sleep hygiene, discuss sleep cycles and impact of sleep habits. Review good sleep hygiene tips.     Education: Stress and Anxiety: - Provides group verbal and written instruction about the health risks of elevated stress and causes of high stress.  Discuss the correlation between heart/lung disease and anxiety and treatment options. Review healthy ways to manage with stress and anxiety.    Initial Review & Psychosocial Screening:   Quality of Life Scores:   Scores of 19 and below usually indicate a poorer quality of life in these areas.  A difference of  2-3 points is a clinically meaningful difference.  A difference of 2-3 points in the total score of the Quality of Life Index has been associated with significant improvement in overall quality of life, self-image, physical symptoms, and general health in studies assessing change in quality of life.  PHQ-9: Recent Review Flowsheet Data    Depression screen [facility] 2/9 10/25/2019 09/09/2019 09/08/2019 09/08/2019 05/20/2019   Decreased Interest 2 0 '1 1 1   ' Down, Depressed, Hopeless 0 '1 1 1 2   ' PHQ - 2 Score '2 1 2 2 3   ' Altered sleeping '2 1 3 3 2   ' Tired, decreased energy 1 1 0 0 3   Change in appetite 0 2 0 0 2   Feeling bad or failure about yourself  1 0 '3 3 2   ' Trouble concentrating 0 0 0 0 2   Moving slowly or fidgety/restless 0 0 - 3 2   Suicidal thoughts 0 0 0 0 0   PHQ-9 Score '6 5 8 11 16   ' Difficult doing work/chores Somewhat difficult Somewhat difficult Not difficult at all Not difficult at all Somewhat difficult     Interpretation of Total Score  Total Score Depression Severity:  1-4 = Minimal depression, 5-9 = Mild depression, 10-14 = Moderate depression, 15-19 = Moderately severe depression, 20-27 = Severe depression    Psychosocial Evaluation and Intervention:  Psychosocial Evaluation - 06/30/19 1345      Psychosocial Evaluation & Interventions   Interventions Encouraged to exercise with the program and follow exercise prescription;Stress management education    Comments Lucas Caldwell reports he is less stressed now that he has been coming to class and exercising at home. ADLs are still hard, but they are manageable; they have improved since starting rehab.    Expected Outcomes Short: Attend rehab regularly to build stamina and improve breating. Long:Improve stamina.    Continue Psychosocial Services  Follow up required by staff           Psychosocial Re-Evaluation:  Psychosocial Re-Evaluation    Row  Name 08/18/19 1408 09/09/19 1357 10/25/19 1352         Psychosocial Re-Evaluation   Current issues with Current Stress Concerns History of Depression;Current Psychotropic Meds;Current Sleep Concerns History of Depression;Current Psychotropic Meds;Current Sleep Concerns     Comments Lucas Caldwell says he sleeps pretty well and has no stress concerns Reviewed patient health questionnaire (PHQ-9) with patient for follow up. Previously, patients score indicated signs/symptoms of depression.  Reviewed to see if patient is improving symptom wise while in program.  Score improved and patient states that it is because he has been able to workout more and have energy. Patient reports he is holding up well mentally. Reports compliance with medications. Reports sleeping is better, he is keeping a more consistent schedule with the times that he is going to bed and waking up. PHQ-9 did go up 1 point, however, patient has been out of rehab for some time.     Expected Outcomes Short: continue to exercise Long: maintai positive outlook Short: Continue to attend [facility] regularly for regular exercise and social engagement. Long: Continue to improve symptoms and manage a positive mental state. Short: Continue to maintain routine sleep  schedule Long: Utilize exercise to maintain positive attitude and social interaction     Interventions -- Encouraged to attend Cardiac Rehabilitation for the exercise Encouraged to attend Cardiac Rehabilitation for the exercise     Continue Psychosocial Services  -- Follow up required by staff Follow up required by staff            Psychosocial Discharge (Final Psychosocial Re-Evaluation):  Psychosocial Re-Evaluation - 10/25/19 1352      Psychosocial Re-Evaluation   Current issues with History of Depression;Current Psychotropic Meds;Current Sleep Concerns    Comments Patient reports he is holding up well mentally. Reports compliance with medications. Reports sleeping is better, he is keeping a more consistent schedule with the times that he is going to bed and waking up. PHQ-9 did go up 1 point, however, patient has been out of rehab for some time.    Expected Outcomes Short: Continue to maintain routine sleep schedule Long: Utilize exercise to maintain positive attitude and social interaction    Interventions Encouraged to attend Cardiac Rehabilitation for the exercise    Continue Psychosocial Services  Follow up required by staff           Vocational Rehabilitation: Provide vocational rehab assistance to qualifying candidates.   Vocational Rehab Evaluation & Intervention:   Education: Education Goals: Education classes will be provided on a variety of topics geared toward better understanding of heart health and risk factor modification. Participant will state understanding/return demonstration of topics presented as noted by education test scores.  Learning Barriers/Preferences:   General Cardiac Education Topics:  AED/CPR: - Group verbal and written instruction with the use of models to demonstrate the basic use of the AED with the basic ABC's of resuscitation.   Anatomy & Physiology of the Heart: - Group verbal and written instruction and models provide basic cardiac  anatomy and physiology, with the coronary electrical and arterial systems. Review of Valvular disease and Heart Failure   Cardiac Procedures: - Group verbal and written instruction to review commonly prescribed medications for heart disease. Reviews the medication, class of the drug, and side effects. Includes the steps to properly store meds and maintain the prescription regimen. (beta blockers and nitrates)   Cardiac Medications I: - Group verbal and written instruction to review commonly prescribed medications for heart disease. Reviews the medication, class  of the drug, and side effects. Includes the steps to properly store meds and maintain the prescription regimen.   Cardiac Medications II: -Group verbal and written instruction to review commonly prescribed medications for heart disease. Reviews the medication, class of the drug, and side effects. (all other drug classes)   Cardiac Rehab from 11/17/2019 in [facility] Cardiac and Pulmonary Rehab  Date 10/27/19  Educator SB  Instruction Review Code 1- Verbalizes Understanding       Go Sex-Intimacy & Heart Disease, Get SMART - Goal Setting: - Group verbal and written instruction through game format to discuss heart disease and the return to sexual intimacy. Provides group verbal and written material to discuss and apply goal setting through the application of the S.M.A.R.T. Method.   Other Matters of the Heart: - Provides group verbal, written materials and models to describe Stable Angina and Peripheral Artery. Includes description of the disease process and treatment options available to the cardiac patient.   Infection Prevention: - Provides verbal and written material to individual with discussion of infection control including proper hand washing and proper equipment cleaning during exercise session.   Cardiac Rehab from 11/17/2019 in [facility] Cardiac and Pulmonary Rehab  Date 05/20/19  Educator AS  Instruction Review Code 1- Verbalizes  Understanding      Falls Prevention: - Provides verbal and written material to individual with discussion of falls prevention and safety.   Cardiac Rehab from 11/17/2019 in [facility] Cardiac and Pulmonary Rehab  Date 05/20/19  Educator AS  Instruction Review Code 1- Verbalizes Understanding      Other: -Provides group and verbal instruction on various topics (see comments)   Knowledge Questionnaire Score:   Core Components/Risk Factors/Patient Goals at Admission:   Education:Diabetes - Individual verbal and written instruction to review signs/symptoms of diabetes, desired ranges of glucose level fasting, after meals and with exercise. Acknowledge that pre and post exercise glucose checks will be done for 3 sessions at entry of program.   Education: Know Your Numbers and Risk Factors: -Group verbal and written instruction about important numbers in your health.  Discussion of what are risk factors and how they play a role in the disease process.  Review of Cholesterol, Blood Pressure, Diabetes, and BMI and the role they play in your overall health.   Cardiac Rehab from 11/17/2019 in [facility] Cardiac and Pulmonary Rehab  Date 10/27/19  Educator SB  Instruction Review Code 1- Verbalizes Understanding      Core Components/Risk Factors/Patient Goals Review:   Goals and Risk Factor Review    Row Name 06/30/19 1347 08/18/19 1407 09/09/19 1359 10/25/19 1345       Core Components/Risk Factors/Patient Goals Review   Personal Goals Review Weight Management/Obesity;Heart Failure;Improve shortness of breath with ADL's;Stress;Hypertension Weight Management/Obesity;Heart Failure;Improve shortness of breath with ADL's;Stress;Hypertension Weight Management/Obesity;Improve shortness of breath with ADL's;Diabetes Weight Management/Obesity;Improve shortness of breath with ADL's;Diabetes    Review Lucas Caldwell is taking his medications as directed. [name] managing his stress with exercise and his support system.  He reports improving with ADLs. Lucas Caldwell continues to take meds as directed.  [name] walking some outside of class.  He says he is more limber and feels better since starting HT. Spoke to patient about their shortness of breath and what they can do to improve. Patient has been informed of breathing techniques when starting the program. Patient is informed to tell staff if they have had any med changes and that certain meds they are taking or not taking can  be causing shortness of breath. Batu states he still reports some SOB, feels he is better at managing on his own, has learned PLB technique. Weight has been stable, reports he has lost some since the start of the program. Wife cooks at home and reports he is aiming towards "heart-healthy." Takes blood sugar at home every day, reports stable sugars and recognizes symptoms if he is too high or too low.    Expected Outcomes ST: continue taking medications as directed, continue managing stress levels, continue with exercise and nutrition regimen Short:  continue with current program  Long: manage risk factors and HF Short: Attend [facility] regularly to improve shortness of breath with ADLs. Long: maintain independence with ADLs Short: Add exercise at homecontinue to cook heart-healthy to help contribute to weight loss Long: Manage lifestyle risk factors           Core Components/Risk Factors/Patient Goals at Discharge (Final Review):   Goals and Risk Factor Review - 10/25/19 1345      Core Components/Risk Factors/Patient Goals Review   Personal Goals Review Weight Management/Obesity;Improve shortness of breath with ADL's;Diabetes    Review Lucas Caldwell states he still reports some SOB, feels he is better at managing on his own, has learned PLB technique. Weight has been stable, reports he has lost some since the start of the program. Wife cooks at home and reports he is aiming towards "heart-healthy." Takes blood sugar at home every day, reports stable  sugars and recognizes symptoms if he is too high or too low.    Expected Outcomes Short: Add exercise at homecontinue to cook heart-healthy to help contribute to weight loss Long: Manage lifestyle risk factors           ITP Comments:  ITP Comments    Row Name 05/24/19 0936 06/02/19 1353 06/09/19 0602 07/07/19 0528 08/04/19 0541   ITP Comments Completed Initial RD Eval First full day of exercise!  Patient was oriented to gym and equipment including functions, settings, policies, and procedures.  Patient's individual exercise prescription and treatment plan were reviewed.  All starting workloads were established based on the results of the 6 minute walk test done at initial orientation visit.  The plan for exercise progression was also introduced and progression will be customized based on patient's performance and goals. 30 day chart review completed. ITP sent to Dr Zachery Dakins Medical Director, for review,changes as needed and signature. Continue with ITP if no changes requested  New to program 30 Day review completed. Medical Director review done, changes made as directed,and approval shown by signature of Lucas Caldwell. 30 Day review completed. ITP review done, changes made as directed,and approval shown by signature of  Scientist, research (life sciences).   [place] Name 09/01/19 0543 09/27/19 1410 09/29/19 0610 10/27/19 0553 11/17/19 1346   ITP Comments 30 Day review completed. Medical Director ITP review done, changes made as directed, and signed approval by Medical Director. Out since 6/30.  Had right heart cath on 7/8. 30 Day review completed. Medical Director ITP review done, changes made as directed, and signed approval by Medical Director. 30 Day review completed. Medical Director ITP review done, changes made as directed, and signed approval by Medical Director. Marques graduated today from  rehab with 34 sessions completed.  Details of the patient's exercise prescription and what He needs to do in  order to continue the prescription and progress were discussed with patient.  Patient was given a copy of prescription and goals.  Patient verbalized understanding.  [place] plans to continue to exercise by walking at home.          Comments: Discharge ITP

## 2019-11-17 NOTE — Progress Notes (Signed)
Discharge Progress Report  Patient Details  Name: Lucas Caldwell MRN: [id number] Date of Birth: February 09, 1934 Referring Provider:     Cardiac Rehab from 05/20/2019 in [name]  Referring Provider Bensimhon       Number of Visits: 35  Reason for Discharge:  Patient reached a stable level of exercise. Patient independent in their exercise. Patient has met program and personal goals.  Smoking History:  Social History   Tobacco Use  Smoking Status Former Smoker  . Packs/day: 1.75  . Years: 20.00  . Pack years: 35.00  . Types: Cigarettes  . Start date: 5  . Quit date: 62  . Years since quitting: 46.6  Smokeless Tobacco Former User    Diagnosis:  Chronic combined systolic and diastolic congestive heart failure (HCC)  Heart failure, chronic systolic (HCC)  ADL UCSD:   Initial Exercise Prescription:   Discharge Exercise Prescription (Final Exercise Prescription Changes):  Exercise Prescription Changes - 11/10/19 1300      Response to Exercise   Blood Pressure (Admit) 124/78    Blood Pressure (Exercise) 118/60    Heart Rate (Admit) 80 bpm    Heart Rate (Exercise) 112 bpm    Heart Rate (Exit) 108 bpm    Rating of Perceived Exertion (Exercise) 11    Symptoms none    Duration Continue with 30 min of aerobic exercise without signs/symptoms of physical distress.    Intensity THRR unchanged      Progression   Progression Continue to progress workloads to maintain intensity without signs/symptoms of physical distress.    Average METs 2.5      Resistance Training   Training Prescription Yes    Weight 3 lb    Reps 10-15      Interval Training   Interval Training No      Recumbant Bike   Level 5    Watts 22    Minutes 15    METs 2      Home Exercise Plan   Plans to continue exercise at Home (comment)   walking   Frequency Add 2 additional days to program exercise sessions.    Initial Home Exercises Provided 10/25/19            Functional Capacity:  6 Minute Walk    Row Name 10/27/19 1538         6 Minute Walk   Phase Discharge     Distance 813 feet     Distance % Change 13.7 %     Distance Feet Change 98 ft     Walk Time 4.8 minutes     # of Rest Breaks 1     MPH 1.92     METS 1.54     RPE 16     Perceived Dyspnea  3     VO2 Peak 5.39     Symptoms Yes (comment)     Comments SOB- resolved after rest     Resting HR 76 bpm     Resting BP 140/70     Resting Oxygen Saturation  98 %     Exercise Oxygen Saturation  during 6 min walk 98 %     Max Ex. HR 109 bpm     Max Ex. BP 168/76            Psychological, QOL, Others - Outcomes: PHQ 2/9: Depression screen [facility] 2/9 10/25/2019 09/09/2019 09/08/2019 09/08/2019 05/20/2019  Decreased Interest 2 0 '1 1 1  ' Down, Depressed,  Hopeless 0 '1 1 1 2  ' PHQ - 2 Score '2 1 2 2 3  ' Altered sleeping '2 1 3 3 2  ' Tired, decreased energy 1 1 0 0 3  Change in appetite 0 2 0 0 2  Feeling bad or failure about yourself  1 0 '3 3 2  ' Trouble concentrating 0 0 0 0 2  Moving slowly or fidgety/restless 0 0 - 3 2  Suicidal thoughts 0 0 0 0 0  PHQ-9 Score '6 5 8 11 16  ' Difficult doing work/chores Somewhat difficult Somewhat difficult Not difficult at all Not difficult at all Somewhat difficult  Some recent data might be hidden    Quality of Life:  Nutrition & Weight - Outcomes:   Nutrition:  Nutrition Therapy & Goals - 05/24/19 0904      Nutrition Therapy   Diet HH, Low Na, DM    Drug/Food Interactions Coumadin/Vit K    Protein (specify units) 80-85g    Fiber 30 grams    Whole Grain Foods 3 servings    Saturated Fats 12 max. grams    Fruits and Vegetables 5 servings/day    Sodium 1.5 grams      Personal Nutrition Goals   Nutrition Goal ST: have one protein food at each meal. Small frequent meals. LT: improving SOB and DOE. 4/10    Comments Pt reports needing help. SOB with eating. Not much appetite. eggs and grits in am. turnip greens and green beans. Canned peaches.  Discussed protein sources with wife and pt. Discussed how right now the focus is on protein and calorie needs as pt is not eating due to SOB, but discussed some HH and DM options as well. Wife goignt o try BBG Chicken. Pt reports not eating many foods high in protein. Fried chicken legs last night with some peaches, BG this morning was 188, usually around 130-150. BG stable throught the day. Discussed HH, DM friendly eating. Discussed protein and calorie needs and how to meet them with SOB such as small frequent meals. Hard for pt to hear, will talk more during class time in person.      Intervention Plan   Intervention Prescribe, educate and counsel regarding individualized specific dietary modifications aiming towards targeted core components such as weight, hypertension, lipid management, diabetes, heart failure and other comorbidities.;Nutrition handout(s) given to patient.    Expected Outcomes Short Term Goal: Understand basic principles of dietary content, such as calories, fat, sodium, cholesterol and nutrients.;Short Term Goal: A plan has been developed with personal nutrition goals set during dietitian appointment.;Long Term Goal: Adherence to prescribed nutrition plan.           Nutrition Discharge:   Education Questionnaire Score:   Goals reviewed with patient; copy given to patient.

## 2019-11-17 NOTE — Telephone Encounter (Signed)
I called and spoke with Mr. Quesinberry and notified him that his medication is available for pick up.

## 2019-11-17 NOTE — Progress Notes (Signed)
Daily Session Note  Patient Details  Name: Lucas Caldwell MRN: [id number] Date of Birth: [birth year] Referring Provider:     Cardiac Rehab from 05/20/2019 in [facility] Cardiac and Pulmonary Rehab  Referring Provider Bensimhon      Encounter Date: 11/17/2019  Check In:  Session Check In - 11/17/19 1344      Check-In   Supervising physician immediately available to respond to emergencies See telemetry face sheet for immediately available ER MD    Location [facility]-Cardiac & Pulmonary Rehab    Staff Present Renita Papa, RN BSN;Joseph Lou Miner, [place] Exercise Physiologist;Amanda Oletta Darter, [place], ACSM CEP, Exercise Physiologist    Virtual Visit No    Medication changes reported     No    Fall or balance concerns reported    No    Warm-up and Cool-down Performed on first and last piece of equipment    Resistance Training Performed Yes    VAD Patient? No    PAD/SET Patient? No      Pain Assessment   Currently in Pain? No/denies              Social History   Tobacco Use  Smoking Status Former Smoker  . Packs/day: 1.75  . Years: 20.00  . Pack years: 35.00  . Types: Cigarettes  . Start date: 64  . Quit date: 46  . Years since quitting: 46.6  Smokeless Tobacco Former Systems developer    Goals Met:  Independence with exercise equipment Exercise tolerated well No report of cardiac concerns or symptoms Strength training completed today  Goals Unmet:  Not Applicable  Comments:  Lucas Caldwell graduated today from  rehab with 34 sessions completed.  Details of the patient's exercise prescription and what He needs to do in order to continue the prescription and progress were discussed with patient.  Patient was given a copy of prescription and goals.  Patient verbalized understanding.  Lucas Caldwell plans to continue to exercise by walking at home.    Dr. Emily Filbert is Medical Director for [name].

## 2019-11-23 ENCOUNTER — Other Ambulatory Visit: Payer: Self-pay | Admitting: Internal Medicine

## 2019-11-23 NOTE — Progress Notes (Signed)
Cardiology Office Note    Date:  11/24/2019   ID:  Lucas Caldwell, DOB 29-Oct-1933, MRN [id number]  PCP:  Lucas Hauser, DO  Cardiologist:  Lucas Bush, MD  Electrophysiologist:  None  Advanced Heart Failure: Lucas Caldwell  Chief Complaint: Follow up  History of Present Illness:   Lucas Caldwell is a 86 y.o. 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  He previously underwent RCA stenting in 08/2016 and was noted to have residual 70% left main stenosis. At that time, he was seen by CVTS and not felt to be a suitable surgical candidate, and was medically managed. Following this, he continued to note DOE and underwent repeat diagnostic cath in 08/2018 which demonstrated 70% left main stenosis with an abnormal iFR of 0.86, though the drop in iFR was due to diffuse proximal LAD disease and there was only a small gradient involving the ostial left main. He was medically managed. Follow up echo at that time showed a reduction in his EF to 35-40%. In the setting of ongoing anginal symptoms throughout 2020, he subsequently underwent PCI/DES to the proximal LAD in 03/2019. Follow up echo in 04/2019 showed an EF of 30-35% with global hypokinesis. Following PCI, he noted significant improvement in his symptoms and he was referred to the heart failure clinic. Prior event monitoring in 06/2019, did not demonstrate any significant arrhythmias. He underwent CardioMEMS and RHC in 7/021. RHC was notable for an RA of 8, PA 34/7 (17), PCWP 12, and normal cardiac output/index. Post procedure course was complicated by a right groin wound infection. He was last seen in this office on 10/20/2019 and was doing reasonably well from a cardiac  perspective with his dyspnea a little improved when compared to prior visits, though he did continue to note DOE and fatigue. His weight was stable. He was last seen by the advanced heart failure service on 11/04/2019, and noted to be inconsistent with his CardioMEMS transmissions. He had missed 25 readings since his implant with most readings ranging from 14-19 and a stable weight. It was noted he has not tolerated titration of medications due to dizziness. Following this visit, his torsemide was briefly titrated to 80 mg daily, followed by subsequent return to prior dosing of 60 mg daily.   CardioMEMS data reviewed dating back to 11/20/2019 with current readings at goal. Please see attached note to this patient encounter.    He comes in today accompanied by his wife and is doing very well from a cardiac perspective.  He denies any chest pain, dyspnea, palpitations, dizziness, presyncope, syncope, lower extremity swelling, abdominal distention, worsening orthopnea, PND, or early satiety.  [name] tolerating all medications without issues.  His weight remains stable at home typically running around 196 to 198 pounds.  [name] watching his salt and p.o. fluid intake.  He has been more adherent with transmitting his CardioMEMS data.  No falls, hematochezia, melena, hemoptysis, hematuria, or hematemesis.  He does not have any issues or concerns at this time.   Labs independently reviewed: 11/24/2019 - INR 2.4 10/2019 - BUN 32, SCr 1.96, potassium 5.6, HGB 11.9, PLT 223, BNP 100 08/2019 - A1c 7.1 04/2019 - TSH normal, magnesium 1.8 08/2018 -  TC 110, TG 155, HDL 28, LDL 51, albumin 3.7, AST/ALT normal  Past Medical History:  Diagnosis Date   Arthritis    "probably in his legs" (08/15/2016)   Blind right eye    BPH (benign prostatic hyperplasia)    Breast asymmetry    Left breast is larger, present for several years.   CAD (coronary artery disease)    a. Cath in the late 90's - reportedly ok;  b. 2014 s/p  stenting x 2 @ [facility]; c 08/19/16 Cath/PCI with DES -> RCA, plan to treat LM 70% medically. Seen by surgery and felt to be too high risk for CABG; d. 08/2018 Cath: LM 70 (iFR 0.86), LAD 20ost, 40p, 50/14m D1 70, LCX patent stent, OM1 30, RCA patent stent, 448mSR, 40d, RPDA 30. PCWP 8. CO/CI 4.0/2.1; e. 03/2019 PCI to LAD (2.5x15 Resolute Onyx DES).   Chronic combined systolic (congestive) and diastolic (congestive) heart failure ([facility]   a. Previously reduced EF-->50% by echo in 2012;  b. 06/2015 Echo: EF 50-55%  c. 07/2016 Echo: EF 45-50%; d. 12/2017 Echo: EF 55%, Gr1 DD, mild MR; e. 08/2018 Echo: EF 35-40%, mildly dil PA; f. 04/2019 Echo: EF 30-35%, glob HK, nl RV fxn; g. s/p Cardiomems.   Chronic kidney disease (CKD), stage IV (severe) (HCC)    CML (chronic myelocytic leukemia) (HCC)    GERD (gastroesophageal reflux disease)    GIB (gastrointestinal bleeding)    a. 12/2017 3 unit PRBC GIB in setting of coumadin-->Endo/colon multiple duodenal ulcers and a single bleeding ulcer in the proximal ascending colon status post hemostatic clipping x2.   Gout    Hyperlipemia    Hypertension    Hypothyroidism    Iron deficiency anemia    Ischemic cardiomyopathy    a. Previously reduced EF-->50% by echo in 2012;  b. 06/2015 Echo: EF 50-55%, Gr2 DD, mild MR, mildly dil LA, Ao sclerosis, mild TR;  c. 07/2016 Echo: EF 45-50%; d. 12/2017 Echo: EF 55%, Gr1 DD, mild MR; e. 08/2018 Echo: EF 35-40%; f. 04/2019 Echo: EF 30-35%.   Migraine    "in the 1960s" (08/15/2016)   PAF (paroxysmal atrial fibrillation) (HCC)    a. ? Dx 2014-->s/p DCCV;  b. CHA2DS2VASc = 6--> Coumadin.   [place]state cancer ([place]   RBBB    Type II diabetes mellitus ([place]CT[facility] [place]    Past Surgical History:  Procedure Laterality Date   CATARACT EXTRACTION W/ INTRAOCULAR LENS  IMPLANT, BILATERAL Bilateral    COLONOSCOPY N/A 01/13/2018   Procedure: COLONOSCOPY;  Surgeon: VaLin LandsmanMD;  Location: [facility] ENDOSCOPY;  Service:  Gastroenterology;  Laterality: N/A;   COLONOSCOPY WITH PROPOFOL N/A 01/01/2018   Procedure: COLONOSCOPY WITH PROPOFOL;  Surgeon: VaLin LandsmanMD;  [facility];  Service: Gastroenterology;  Laterality: N/A;   CORONARY ANGIOPLASTY WITH STENT PLACEMENT  09/2012   2 stents   CORONARY STENT INTERVENTION N/A 08/19/2016   Procedure: Coronary Stent Intervention;  Surgeon: EnNelva BushMD;  [place] [facility];  Service: Cardiovascular;  Laterality: N/A;   CORONARY STENT INTERVENTION N/A 04/13/2019   Procedure: CORONARY STENT INTERVENTION;  Surgeon: EnNelva BushMD;  [place] [facility];  Service: Cardiovascular;  Laterality: N/A;   ESOPHAGEAL MANOMETRY N/A 12/16/2017   Procedure: ESOPHAGEAL MANOMETRY (EM);  Surgeon: VaLin LandsmanMD;  Location: [facility] ENDOSCOPY;  Service: Gastroenterology;  Laterality: N/A;   ESOPHAGOGASTRODUODENOSCOPY N/A 12/20/2016   Procedure: ESOPHAGOGASTRODUODENOSCOPY (EGD);  Surgeon: VaLin LandsmanMD;  Location: [facility] ENDOSCOPY;  Service: Gastroenterology;  Laterality: N/A;   ESOPHAGOGASTRODUODENOSCOPY N/A 01/13/2018   Procedure: ESOPHAGOGASTRODUODENOSCOPY (EGD);  Surgeon: Lin Landsman, MD;  Location[facility] ENDOSCOPY;  Service: Gastroenterology;  Laterality: N/A;   ESOPHAGOGASTRODUODENOSCOPY (EGD) WITH PROPOFOL N/A 11/03/2017   Procedure: ESOPHAGOGASTRODUODENOSCOPY (EGD) WITH PROPOFOL;  Surgeon: Lin Landsman, MD;  Location: [facility] ENDOSCOPY;  Service: Gastroenterology;  Laterality: N/A;   EYE SURGERY     HERNIA REPAIR     "navel"   LEFT HEART CATH AND CORONARY ANGIOGRAPHY N/A 08/14/2016   Procedure: Left Heart Cath and Coronary Angiography;  Surgeon: Lucas Bush, MD;  Location: [place] CV LAB;  Service: Cardiovascular;  Laterality: N/A;   PRESSURE SENSOR/CARDIOMEMS N/A 09/23/2019   Procedure: PRESSURE SENSOR/CARDIOMEMS;  Surgeon: Jolaine Artist, MD;  Location: [place] CV LAB;   Service: Cardiovascular;  Laterality: N/A;   RIGHT HEART CATH N/A 09/23/2019   Procedure: RIGHT HEART CATH;  Surgeon: Jolaine Artist, MD;  Location: [place] CV LAB;  Service: Cardiovascular;  Laterality: N/A;   RIGHT/LEFT HEART CATH AND CORONARY ANGIOGRAPHY N/A 09/01/2018   Procedure: RIGHT/LEFT HEART CATH AND CORONARY ANGIOGRAPHY;  Surgeon: Lucas Bush, MD;  Location: [place] CV LAB;  Service: Cardiovascular;  Laterality: N/A;   RIGHT/LEFT HEART CATH AND CORONARY ANGIOGRAPHY N/A 04/13/2019   Procedure: RIGHT/LEFT HEART CATH AND CORONARY ANGIOGRAPHY;  Surgeon: Lucas Bush, MD;  Location: [place] CV LAB;  Service: Cardiovascular;  Laterality: N/A;   TOE AMPUTATION Right    "big toe"    Current Medications: Current Meds  Medication Sig   ACCU-CHEK AVIVA PLUS test strip Use to check blood sugar up to 2 times daily   Accu-Chek Softclix Lancets lancets Use to check blood sugar up to 2 times daily.   acetaminophen (TYLENOL) 500 MG tablet Take 1,000 mg by mouth every 6 (six) hours as needed for moderate pain or headache.    Alcohol Swabs 70 % PADS Use when checking blood sugar up to 3x daily   allopurinol (ZYLOPRIM) 100 MG tablet Take 1 tablet (100 mg total) by mouth daily.   aspirin 81 MG chewable tablet Chew 81 mg by mouth daily.   Blood Glucose Monitoring Suppl (ACCU-CHEK AVIVA PLUS) w/Device KIT Use to check blood sugar up to 2 times daily   bosutinib (BOSULIF) 100 MG tablet Take 100 mg by mouth at bedtime as needed (tired/not feeling well). Take with food.    Dulaglutide (TRULICITY) 3.40 BT/2.4EL SOPN Inject 0.75 mg into the skin once a week. (Patient taking differently: Inject 0.75 mg into the skin every Saturday. )   empagliflozin (JARDIANCE) 10 MG TABS tablet Take 10 mg by mouth daily before breakfast.   gabapentin (NEURONTIN) 300 MG capsule TAKE TWO CAPSULES BY MOUTH TWICE DAILY. (MAX DOSE = 1200MG DAILY FOR KIDNEY FUNCTION)   hydrALAZINE  (APRESOLINE) 25 MG tablet Take 1 tablet (25 mg total) by mouth 3 (three) times daily.   Insulin Glargine ([name] KWIKPEN) 100 UNIT/ML SOPN Inject 38 Units into the skin daily.    Insulin Pen Needle 31G X 5 MM MISC 31 g by Does not apply route as directed.   isosorbide mononitrate (IMDUR) 60 MG 24 hr tablet Take 1 tablet (60 mg total) by mouth daily.   levothyroxine (SYNTHROID) 25 MCG tablet Take 1 tablet (25 mcg total) by mouth daily before breakfast.   Menthol-Zinc Oxide (GOLD BOND EX) Apply 1 application topically daily as needed (muscle pain).   metoprolol succinate (TOPROL-XL) 25 MG 24 hr  tablet TAKE ONE TABLET BY MOUTH ONCE DAILY   Multiple Vitamin (MULTIVITAMIN WITH MINERALS) TABS tablet Take 1 tablet by mouth daily.   nitroGLYCERIN (NITROSTAT) 0.4 MG SL tablet Place 1 tablet (0.4 mg total) under the tongue every 5 (five) minutes x 3 doses as needed for chest pain.   omeprazole (PRILOSEC) 20 MG capsule Take 20 mg by mouth in the morning and at bedtime. Morning & afternoon   Polyethyl Glycol-Propyl Glycol (SYSTANE OP) Place 1 drop into both eyes 3 (three) times daily as needed (dry/irritated eyes.).    polyethylene glycol (MIRALAX / GLYCOLAX) 17 g packet Take 17 g by mouth daily as needed for mild constipation.    potassium chloride SA (KLOR-CON) 20 MEQ tablet Take 3 tablets (60 mEq total) by mouth daily. (Patient taking differently: Take 40 mEq by mouth daily. )   PROVENTIL HFA 108 (90 Base) MCG/ACT inhaler Inhale 2 puffs into the lungs every 6 (six) hours as needed for wheezing or shortness of breath.   ranolazine (RANEXA) 500 MG 12 hr tablet Take 1 tablet (500 mg total) by mouth 2 (two) times daily.   rosuvastatin (CRESTOR) 5 MG tablet TAKE ONE TABLET BY MOUTH ONCE DAILY   torsemide (DEMADEX) 20 MG tablet Take 3 tablets (60 mg total) by mouth daily.   traMADol (ULTRAM) 50 MG tablet Take 1-2 tablets (50-100 mg total) by mouth at bedtime as needed. (Patient taking  differently: Take 50-100 mg by mouth at bedtime as needed (pain.). )   warfarin (COUMADIN) 3 MG tablet TAKE 1 TO 1 & 1/2 TABLETS ONCE A DAY AS DIRECTED BY THE COUMADIN CLINIC    Allergies:   Clopidogrel, Ciprofloxacin, Mirtazapine, and Spironolactone   Social History   Socioeconomic History   Marital status: Married    Spouse name: Not on file   Number of children: Not on file   Years of education: Not on file   Highest education level: 7th grade  Occupational History   Occupation: Retired    Comment: Owned his own [employer].  Tobacco Use   Smoking status: Former Smoker    Packs/day: 1.75    Years: 20.00    Pack years: 35.00    Types: Cigarettes    Start date: 1955    Quit date: 1975    Years since quitting: 46.7   Smokeless tobacco: Former Counsellor Use: Never used  Substance and Sexual Activity   Alcohol use: No    Comment: previously drank heavily - quit 1979.   Drug use: No   Sexual activity: Never  Other Topics Concern   Not on file  Social History Narrative   Working on transportation truck part time    Social Determinants of Radio broadcast assistant Strain: Low Risk    Difficulty of Paying Living Expenses: Not very hard  Food Insecurity: No Food Insecurity   Worried About Charity fundraiser in the Last Year: Never true   Arboriculturist in the Last Year: Never true  Transportation Needs: No Transportation Needs   Lack of Transportation (Medical): No   Lack of Transportation (Non-Medical): No  Physical Activity: Inactive   Days of Exercise per Week: 0 days   Minutes of Exercise per Session: 0 min  Stress: No Stress Concern Present   Feeling of Stress : Not at all  Social Connections: Moderately Integrated   Frequency of Communication with Friends and Family: More than three times a week  Frequency of Social Gatherings with Friends and Family: More than three times a week   Attends Religious Services:  More than 4 times per year   Active Member of [employer] or Organizations: No   Attends Archivist Meetings: Never   Marital Status: Married     Family History:  The patient's family history includes Brain cancer in his father; Diabetes in his sister; Heart attack in his sister.  ROS:   Review of Systems  Constitutional: Positive for malaise/fatigue. Negative for chills, diaphoresis, fever and weight loss.  HENT: Negative for congestion.   Eyes: Negative for discharge and redness.  Respiratory: Negative for cough, sputum production, shortness of breath and wheezing.   Cardiovascular: Negative for chest pain, palpitations, orthopnea, claudication, leg swelling and PND.  Gastrointestinal: Negative for abdominal pain, blood in stool, heartburn, melena, nausea and vomiting.  Musculoskeletal: Negative for falls and myalgias.  Skin: Negative for rash.  Neurological: Negative for dizziness, tingling, tremors, sensory change, speech change, focal weakness, loss of consciousness and weakness.  Endo/Heme/Allergies: Does not bruise/bleed easily.  Psychiatric/Behavioral: Negative for substance abuse. The patient is not nervous/anxious.   All other systems reviewed and are negative.    EKGs/Labs/Other Studies Reviewed:    Studies reviewed were summarized above. The additional studies were reviewed today:  2D echo 07/2016: - Left ventricle: The cavity size was mildly dilated. There was  mild concentric hypertrophy. Systolic function was mildly  reduced. The estimated ejection fraction was in the range of 45%  to 50%. Possible hypokinesis of the inferior myocardium. Left  ventricular diastolic function parameters were normal.  - Mitral valve: There was mild regurgitation.  - Left atrium: The atrium was mildly dilated.  - Pulmonary arteries: Systolic pressure could not be accurately  estimated.  __________  L[facility] 07/2016: Conclusions: 1. Significant multivessel coronary  artery disease, including 70% ostial LMCA stenosis (with catheter dampening) and 95% proximal/mid RCA lesion. 2. Patent stents in the mid LCx and mid RCA. 3. Upper normal left ventricular filling pressure.  Recommendations: 1. Given LMCA and RCA disease in the setting of diabetes mellitus, surgical consultation for CABG should be considered. However, if comorbidities preclude surgical intervention, PCI to unprotected LMCA and mid RCA could be considered. 2. Medical therapy, including gentle hydration, given chronic kidney disease. 3. Aggressive secondary prevention. __________  [facility] 08/2016: Conclusions: 4. Moderate to severe proximal/mid RCA disease with focal 95% stenosis at proximal margin of previously placed mid-RCA stent. 5. Mild in-stent restenosis of mid RCA stent. 6. Successful PCI to proximal/mid RCA with placement of a Synergy 3.0 x 28 mm drug-eluting stent with 0% residual stenosis and TIMI-3 flow.  Recommendations: 4. Restart warfarin per pharmacy. Anticipate triple therapy with ASA, ticagrelor, and warfarin x 1 month, following by warfarin and ticagrelor. 5. Aggressive secondary prevention. 6. Medical management of non-critical LMCA stenosis. If chest pain and shortness of breath persist, high-risk PCI to the ostial LMCA will need to be considered. __________  2D echo 12/2017: - Left ventricle: The cavity size was normal. Systolic function was  normal. The estimated ejection fraction was 55%. Doppler  parameters are consistent with abnormal left ventricular  relaxation (grade 1 diastolic dysfunction).  - Mitral valve: There was mild regurgitation.   Impressions:   - Normal LVEF with mild diastolic dysfunction and mild MR. __________  ABSI'S 03/2018:  +-------+-----------+-----------+------------+------------+   ABI/TBI Today's ABI Today's TBI Previous ABI Previous TBI   +-------+-----------+-----------+------------+------------+   Right  [place]      0.74  0.76       +-------+-----------+-----------+------------+------------+   Left   [place]      0.77            0.82       +-------+-----------+-----------+------------+------------+     Bilateral ABIs appear essentially unchanged compared to prior study on  07/2015.    Summary:  Right: Resting right ankle-brachial index indicates noncompressible right  lower extremity arteries.  The right toe-brachial index is normal.   Left: Resting left ankle-brachial index indicates noncompressible left  lower extremity arteries.  The left toe-brachial index is normal. __________  [facility] 08/2018: Conclusions: 1. Multivessel CAD, including 60% ostial LMCA, diffuse proximal and mid LAD disease up to 60%, and 40% in-stent restenosis of more distal mid RCA stent. 2. Widely patent proximal RCA and LCx stents. 3. iFR of LMCA/LAD is significant at 0.86, though iFR pullback suggests that reduced flow is due to diffuse LAD disease with only a small pressure gradient involving the ostial LMCA. 4. Normal left and right heart filling pressures. 5. Mildly reduced Fick cardiac output/index. 6. Likely mildly reduced left ventricular systolic function (EF 44-96%), though evaluation is limited by suboptimal opacification of the ventricle.  Recommendations: 1. Optimize medical therapy; will increase isosorbide mononitrate to 30 mg daily and continue other antianginal medications. 2. Decrease torsemide to 40 mg QAM and 20 mg QPM. 3. Obtain echo as an outpatient to better assess LVEF and valves. 4. Follow-up in the office in 2 weeks with BMP at that time. __________  2D echo 08/2018: 1. Severe hypokinesis of the left ventricular, entire inferior wall.  2. The left ventricle has moderately reduced systolic function, with an  ejection fraction of 35-40%. The cavity size was normal. There is mildly  increased left ventricular wall thickness. Left ventricular diastolic  Doppler  parameters are indeterminate.  Left ventricular diffuse hypokinesis.  3. The tricuspid valve is grossly normal.  4. The aortic valve is tricuspid.  5. Mildly dilated pulmonary artery.  6. The interatrial septum was not well visualized.  __________  Elwyn Reach 01/2019:  The patient was monitored for 10 days, 16 hours. 8 days, 23 hours were adequate for analysis.  The predominant rhythm was sinus with an average rate of 83 bpm (range 59-128 bpm in sinus).  There were rare PAC's and PVC's.  Multiple (25) atrial runs were observed, lasting up to 16 beats. The maximal rate was 174 bpm.  There was no sustained arrhythmia or prolonged pause.  Patient triggered events correspond to sinus rhythm and PVC's.   Predominantly sinus rhythm with rare PVC's.  Multiple brief runs of PSVT noted. ___________  [facility] 03/2019: Conclusions: 1. Multivessel coronary artery, similar to prior catheterization in 08/2018.  There is diffuse disease involving the ostial through mid LAD, up to 70%.  Prior iFR and 08/2018 showed sequential LAD lesions were hemodynamically significant. 2. Moderately elevated left heart filling pressure and pulmonary artery pressure. 3. Mildly elevated right heart filling pressure. 4. Mildly reduced Fick cardiac output/index. 5. Successful PCI to mid LAD using Resolute Onyx 2.5 x 15 mm drug eluting stent with <10% residual stenosis and TIMI-3 flow.  Recommendations: 1. Dual antiplatelet therapy with aspirin and ticagrelor for 3 months, at which time we will consider restarting warfarin and dropping ticagrelor. 2. Left heart pressures are significantly higher than prior catheterization in 08/2018.  Patient needs further diuresis and optimization of evidence-based heart failure therapy, including afterload reduction. __________  2D echo 04/2019: 1. Left ventricular ejection fraction, by visual  estimation, is 30 to  35%. The left ventricle has moderate to severely decreased function.  There  is mildly increased left ventricular hypertrophy.  2. The left ventricle demonstrates global hypokinesis, lateral wall  motion best preserved.  3. Global right ventricle has normal systolic function.The right  ventricular size is normal. No increase in right ventricular wall  thickness.  4. Left atrial size was normal.  5. TR signal is inadequate for assessing pulmonary artery systolic  pressure.  __________  Elwyn Reach 06/2019: 1. Sinus rythms with 1AVB - avg HR of 81 bpm. 2. One 5-beat run NSVT 3. Two runs of SVT - the longest lasting 5 beats with an avg rate of 104 bpm 4. Rare PACs and PVCs,  ___________  [facility] 09/2019: Findings:  RA = 8 RV = 24/5 PA = 34/5 (17) PCW = 12 Fick cardiac output/index = 5.9/3.0 Thermo CO/CI = 3.7/1.9 PVR = 1.3 WU Ao sat = 99% PA sat = 71%, 72%  Assessment: 1. Well compensated hemodynamics 2. Successful Cardiomems placement   EKG:  EKG is ordered today.  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 with RBBB, LAFB, and improved 1st degree AV block at 202 ms  Recent Labs: 05/04/2019: Magnesium 1.8; TSH 3.850 11/04/2019: B Natriuretic Peptide 100.5; Hemoglobin 11.9; Platelets 223 11/11/2019: BUN 32; Creatinine, Ser 1.96; Potassium 5.6; Sodium 135  Recent Lipid Panel    Component Value Date/Time   CHOL 110 08/28/2018 1107   TRIG 155 (H) 08/28/2018 1107   HDL 28 (L) 08/28/2018 1107   CHOLHDL 3.9 08/28/2018 1107   VLDL 31 08/28/2018 1107   LDLCALC 51 08/28/2018 1107    PHYSICAL EXAM:    VS:  BP 120/64 (BP Location: Left Arm, Patient Position: Sitting, Cuff Size: Large)    Pulse 80    Ht '5\' 4"'  (1.626 m)    Wt 203 lb 6 oz (92.3 kg)    BMI 34.91 kg/m   BMI: Body mass index is 34.91 kg/m.  Physical Exam Constitutional:      Appearance: [name] well-developed.  HENT:     Head: Normocephalic and atraumatic.  Eyes:     General:        Right eye: No discharge.        Left eye: No discharge.  Neck:     Vascular: No  JVD.  Cardiovascular:     Rate and Rhythm: Normal rate and regular rhythm.     Pulses: No midsystolic click and no opening snap.          Posterior tibial pulses are 2+ on the right side and 2+ on the left side.     Heart sounds: Normal heart sounds, S1 normal and S2 normal. Heart sounds not distant. No murmur heard.  No friction rub.  Pulmonary:     Effort: Pulmonary effort is normal. No respiratory distress.     Breath sounds: Normal breath sounds. No decreased breath sounds, wheezing or rales.  Chest:     Chest wall: No tenderness.  Abdominal:     General: There is no distension.     Palpations: Abdomen is soft.     Tenderness: There is no abdominal tenderness.  Musculoskeletal:     Cervical back: Normal range of motion.  Skin:    General: Skin is warm and dry.     Nails: There is no clubbing.  Neurological:     Mental Status: [name] alert and oriented to person, place, and time.  Psychiatric:  Speech: Speech normal.        Behavior: Behavior normal.        Thought Content: Thought content normal.        Judgment: Judgment normal.     Wt Readings from Last 3 Encounters:  11/24/19 203 lb 6 oz (92.3 kg)  11/04/19 204 lb 3.2 oz (92.6 kg)  10/20/19 203 lb 12.8 oz (92.4 kg)     ASSESSMENT & PLAN:   1. 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  Continue secondary prevention with current medications including aspirin, warfarin, metoprolol, Imdur, Ranexa, and losartan.  [name] no longer on DAPT given need for warfarin with his underlying A. fib under the direction of interventional cardiology.  No plans/indication for further ischemic evaluation or escalation of antianginal medical therapy at this time.  2. HFrEF secondary to mixed NICM and ICM: [name] euvolemic and well compensated with stable CardioMEMS reading.  He has NYHA class III symptoms.  Continue GDMT including Toprol-XL, losartan,  Imdur/hydralazine, Jardiance, and torsemide.  Dizziness/lightheadedness historically has precluded escalation of evidence-based therapy and therefore, he is not on ARNI.  Gynecomastia and underlying CKD preclude addition of spironolactone.  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  However, should his trifascicular block return it is foreseeable he may progress to high-grade AV block at some point and may require PPM.  3. PAF: [name] maintaining sinus rhythm on Toprol-XL without evidence of atrial arrhythmia recurrence.  Given CHA2DS2-VASc of 6 he remains on warfarin with therapeutic INR earlier today.  Follow-up with Coumadin clinic as directed.  No symptoms concerning for bleeding.  Recent CBC with stable hemoglobin.  4. Acute on CKD stage III: Appears to be in the setting of brief course of outpatient diuresis.  Repeat BMP.  5. HTN: Blood pressure is well controlled in the office.  Continue current medical therapy as outlined above.  6. HLD: LDL 51 from 08/2018.  He remains on Crestor.  Not fasting at this time.  When he is seen in follow-up consider fasting lipid panel and LFT.  7. Hyperkalemia: Check BMP.  8. Fatigue and sleep disordered breathing: Previously referred for home sleep study.    Disposition: F/u with Dr. Saunders Revel and [name] as previously scheduled next month.   Medication Adjustments/Labs and Tests Ordered: Current medicines are reviewed at length with the patient today.  Concerns regarding medicines are outlined above. Medication changes, Labs and Tests ordered today are summarized above and listed in the Patient Instructions accessible in Encounters.   Signed, [name], PA-C 11/24/2019 2:08 PM     [name] [place], [place] [address] [phone]

## 2019-11-24 ENCOUNTER — Ambulatory Visit (INDEPENDENT_AMBULATORY_CARE_PROVIDER_SITE_OTHER): Payer: Medicare HMO | Admitting: Physician Assistant

## 2019-11-24 ENCOUNTER — Encounter: Payer: Self-pay | Admitting: Physician Assistant

## 2019-11-24 ENCOUNTER — Other Ambulatory Visit: Payer: Self-pay

## 2019-11-24 ENCOUNTER — Ambulatory Visit (INDEPENDENT_AMBULATORY_CARE_PROVIDER_SITE_OTHER): Payer: Medicare HMO

## 2019-11-24 VITALS — BP 120/64 | HR 80 | Ht 64.0 in | Wt 203.4 lb

## 2019-11-24 DIAGNOSIS — I480 Paroxysmal atrial fibrillation: Secondary | ICD-10-CM

## 2019-11-24 DIAGNOSIS — R5383 Other fatigue: Secondary | ICD-10-CM | POA: Diagnosis not present

## 2019-11-24 DIAGNOSIS — Z7901 Long term (current) use of anticoagulants: Secondary | ICD-10-CM

## 2019-11-24 DIAGNOSIS — E875 Hyperkalemia: Secondary | ICD-10-CM | POA: Diagnosis not present

## 2019-11-24 DIAGNOSIS — I10 Essential (primary) hypertension: Secondary | ICD-10-CM | POA: Diagnosis not present

## 2019-11-24 DIAGNOSIS — N179 Acute kidney failure, unspecified: Secondary | ICD-10-CM | POA: Diagnosis not present

## 2019-11-24 DIAGNOSIS — E785 Hyperlipidemia, unspecified: Secondary | ICD-10-CM | POA: Diagnosis not present

## 2019-11-24 DIAGNOSIS — N183 Chronic kidney disease, stage 3 unspecified: Secondary | ICD-10-CM

## 2019-11-24 DIAGNOSIS — I5022 Chronic systolic (congestive) heart failure: Secondary | ICD-10-CM

## 2019-11-24 DIAGNOSIS — G4730 Sleep apnea, unspecified: Secondary | ICD-10-CM

## 2019-11-24 DIAGNOSIS — I2510 Atherosclerotic heart disease of native coronary artery without angina pectoris: Secondary | ICD-10-CM | POA: Diagnosis not present

## 2019-11-24 LAB — POCT INR: INR: 2.4 (ref 2.0–3.0)

## 2019-11-24 MED ORDER — TAMSULOSIN HCL 0.4 MG PO CAPS: 0.4000 mg | ORAL_CAPSULE | Freq: Every day | ORAL | 11 refills | Status: DC

## 2019-11-24 NOTE — Patient Instructions (Signed)
-   continue warfarin of 1 TABLET EVERY DAY EXCEPT 1.5 TABLETS ON MONDAYS, Lucas Caldwell. - recheck INR in 5 weeks.

## 2019-11-24 NOTE — Patient Instructions (Signed)
Medication Instructions:  1- RESTART Flomax  *If you need a refill on your cardiac medications before your next appointment, please call your pharmacy*   Lab Work: Your physician recommends that you have lab work today(BMET  If you have labs (blood work) drawn today and your tests are completely normal, you will receive your results only by: [name] MyChart Message (if you have MyChart) OR . A paper copy in the mail If you have any lab test that is abnormal or we need to change your treatment, we will call you to review the results.   Testing/Procedures: None ordered   Follow-Up: At [facility], you and your health needs are our priority.  As part of our continuing mission to provide you with exceptional heart care, we have created designated Provider Care Teams.  These Care Teams include your primary Cardiologist (physician) and Advanced Practice Providers (APPs -  Physician Assistants and Nurse Practitioners) who all work together to provide you with the care you need, when you need it.  We recommend signing up for the patient portal called "MyChart".  Sign up information is provided on this After Visit Summary.  MyChart is used to connect with patients for Virtual Visits (Telemedicine).  Patients are able to view lab/test results, encounter notes, upcoming appointments, etc.  Non-urgent messages can be sent to your provider as well.   To learn more about what you can do with MyChart, go to [email or web address].    Your next appointment:   As scheduled

## 2019-11-25 ENCOUNTER — Telehealth: Payer: Self-pay

## 2019-11-25 LAB — BASIC METABOLIC PANEL
BUN/Creatinine Ratio: 19 (ref 10–24)
BUN: 31 mg/dL — ABNORMAL HIGH (ref 8–27)
CO2: 26 mmol/L (ref 20–29)
Calcium: 9.8 mg/dL (ref 8.6–10.2)
Chloride: 98 mmol/L (ref 96–106)
Creatinine, Ser: 1.65 mg/dL — ABNORMAL HIGH (ref 0.76–1.27)
GFR calc Af Amer: 43 mL/min/{1.73_m2} — ABNORMAL LOW (ref >59)
GFR calc non Af Amer: 38 mL/min/{1.73_m2} — ABNORMAL LOW (ref >59)
Glucose: 162 mg/dL — ABNORMAL HIGH (ref 65–99)
Potassium: 4 mmol/L (ref 3.5–5.2)
Sodium: 139 mmol/L (ref 134–144)

## 2019-11-25 NOTE — Telephone Encounter (Signed)
Call to patient to review labs.    Pt verbalized understanding and has no further questions at this time.    Advised pt to call for any further questions or concerns.  No further orders.   

## 2019-11-25 NOTE — Telephone Encounter (Signed)
-----   Message from Rise Mu, PA-C sent at 11/25/2019  7:35 AM EDT ----- Random glucose is elevated with known diabetes Renal function is improved and at his approximate baseline Potassium is improved and at goal  No changes

## 2019-11-28 IMAGING — NM NM GI BLOOD LOSS
2 series · 12 of 12 positions shown · non-contrast
Comparison: CT of the abdomen and pelvis on 06/20/2016

CLINICAL DATA: Several episodes of rectal bleeding/melena. Near
syncope. Patient takes Coumadin. No GI Surgeries Colonoscopy 2 weeks
ago Given 2 units of Blood: Finished at 11am Most Recent Bleeding:
10am

EXAM:
NUCLEAR MEDICINE GASTROINTESTINAL BLEEDING SCAN
TECHNIQUE: Sequential abdominal images were obtained following intravenous
administration of 5c-99m labeled red blood cells.
RADIOPHARMACEUTICALS:  22.04 mCi 5c-99m pertechnetate in-vitro
labeled red cells.

[Series 1000: hour 1 gi bleed · 4.80mm/px · 6 of 60 frames shown]
[frame 6/60]
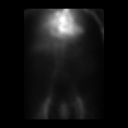
[frame 16/60]
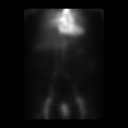
[frame 26/60]
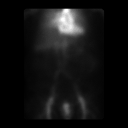
[frame 36/60]
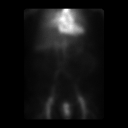
[frame 46/60]
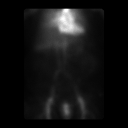
[frame 56/60]
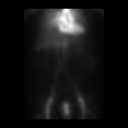

[Series 1000: gi bleed hour 2 · 4.80mm/px · 6 of 60 frames shown]
[frame 6/60]
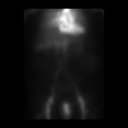
[frame 16/60]
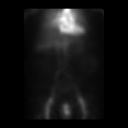
[frame 26/60]
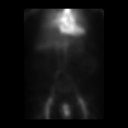
[frame 36/60]
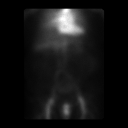
[frame 46/60]
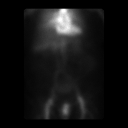
[frame 56/60]
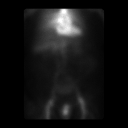

[12 of 12 positions shown; findings below may reference images not displayed]

FINDINGS: There is expected blood pool activity. No activity identified within
bowel loops to indicate the location of GI bleeding.
IMPRESSION: Negative GI bleeding scan.

## 2019-11-29 ENCOUNTER — Ambulatory Visit (INDEPENDENT_AMBULATORY_CARE_PROVIDER_SITE_OTHER): Payer: Medicare HMO | Admitting: Pharmacist

## 2019-11-29 ENCOUNTER — Other Ambulatory Visit: Payer: Self-pay | Admitting: Internal Medicine

## 2019-11-29 DIAGNOSIS — E1121 Type 2 diabetes mellitus with diabetic nephropathy: Secondary | ICD-10-CM

## 2019-11-29 MED ORDER — EASY TOUCH 31 GAUGE X 5/16" NEEDLE (8 MM)
1 refills | 0 days
Start: 2019-11-29 — End: ?

## 2019-11-29 NOTE — Chronic Care Management (AMB) (Signed)
Chronic Care Management   Follow Up Note   11/29/2019 Name: Lucas Caldwell MRN: [id number] DOB: 1933/05/26  Referred by: [name], DO Reason for referral : Chronic Care Management (Patient Phone Call)   Lucas Caldwell is a 86 y.o. year old male who is a primary care patient of [name], DO. The CCM team was consulted for assistance with chronic disease management and care coordination needs.    I reached out to [name] by phone today.   Review of patient status, including review of consultants reports, relevant laboratory and other test results, and collaboration with appropriate care team members and the patient's provider was performed as part of comprehensive patient evaluation and provision of chronic care management services.    SDOH (Social Determinants of Health) assessments performed: No See Care Plan activities for detailed interventions related to [facility])     Outpatient Encounter Medications as of 11/29/2019  Medication Sig  . Dulaglutide (TRULICITY) 7.40 CX/4.4YJ SOPN Inject 0.75 mg into the skin once a week. (Patient taking differently: Inject 0.75 mg into the skin every Saturday. )  . empagliflozin (JARDIANCE) 10 MG TABS tablet Take 10 mg by mouth daily before breakfast.  . Insulin Glargine ([name] KWIKPEN) 100 UNIT/ML SOPN Inject 30 Units into the skin daily.   . isosorbide mononitrate (IMDUR) 60 MG 24 hr tablet Take 1 tablet (60 mg total) by mouth daily.  . potassium chloride SA (KLOR-CON) 20 MEQ tablet Take 3 tablets (60 mEq total) by mouth daily. (Patient taking differently: Take 40 mEq by mouth daily. )  . torsemide (DEMADEX) 20 MG tablet Take 3 tablets (60 mg total) by mouth daily.  [name] ACCU-CHEK AVIVA PLUS test strip Use to check blood sugar up to 2 times daily  . Accu-Chek Softclix Lancets lancets Use to check blood sugar up to 2 times daily.  [name] acetaminophen (TYLENOL) 500 MG tablet Take 1,000 mg by mouth  every 6 (six) hours as needed for moderate pain or headache.   . Alcohol Swabs 70 % PADS Use when checking blood sugar up to 3x daily  . allopurinol (ZYLOPRIM) 100 MG tablet Take 1 tablet (100 mg total) by mouth daily.  [name] aspirin 81 MG chewable tablet Chew 81 mg by mouth daily.  . Blood Glucose Monitoring Suppl (ACCU-CHEK AVIVA PLUS) w/Device KIT Use to check blood sugar up to 2 times daily  . bosutinib (BOSULIF) 100 MG tablet Take 100 mg by mouth at bedtime as needed (tired/not feeling well). Take with food.   . gabapentin (NEURONTIN) 300 MG capsule TAKE TWO CAPSULES BY MOUTH TWICE DAILY. (MAX DOSE = 1200MG DAILY FOR KIDNEY FUNCTION)  . hydrALAZINE (APRESOLINE) 25 MG tablet Take 1 tablet (25 mg total) by mouth 3 (three) times daily.  . Insulin Pen Needle 31G X 5 MM MISC 31 g by Does not apply route as directed.  [name] levothyroxine (SYNTHROID) 25 MCG tablet Take 1 tablet (25 mcg total) by mouth daily before breakfast.  . losartan (COZAAR) 25 MG tablet Take 1 tablet (25 mg total) by mouth daily.  . Menthol-Zinc Oxide (GOLD BOND EX) Apply 1 application topically daily as needed (muscle pain).  . metoprolol succinate (TOPROL-XL) 25 MG 24 hr tablet TAKE ONE TABLET BY MOUTH ONCE DAILY  . Multiple Vitamin (MULTIVITAMIN WITH MINERALS) TABS tablet Take 1 tablet by mouth daily.  . nitroGLYCERIN (NITROSTAT) 0.4 MG SL tablet Place 1 tablet (0.4 mg total) under the tongue every 5 (five)  minutes x 3 doses as needed for chest pain.  [name] omeprazole (PRILOSEC) 20 MG capsule Take 20 mg by mouth in the morning and at bedtime. Morning & afternoon  . Polyethyl Glycol-Propyl Glycol (SYSTANE OP) Place 1 drop into both eyes 3 (three) times daily as needed (dry/irritated eyes.).   [name] polyethylene glycol (MIRALAX / GLYCOLAX) 17 g packet Take 17 g by mouth daily as needed for mild constipation.   [name] PROVENTIL HFA 108 (90 Base) MCG/ACT inhaler Inhale 2 puffs into the lungs every 6 (six) hours as needed for wheezing or shortness of  breath.  . ranolazine (RANEXA) 500 MG 12 hr tablet Take 1 tablet (500 mg total) by mouth 2 (two) times daily.  . rosuvastatin (CRESTOR) 5 MG tablet TAKE ONE TABLET BY MOUTH ONCE DAILY  . tamsulosin (FLOMAX) 0.4 MG CAPS capsule Take 1 capsule (0.4 mg total) by mouth daily after supper.  . traMADol (ULTRAM) 50 MG tablet Take 1-2 tablets (50-100 mg total) by mouth at bedtime as needed. (Patient taking differently: Take 50-100 mg by mouth at bedtime as needed (pain.). )  . warfarin (COUMADIN) 3 MG tablet TAKE 1 TO 1 & 1/2 TABLETS ONCE A DAY AS DIRECTED BY THE COUMADIN CLINIC   No facility-administered encounter medications on file as of 11/29/2019.    Goals Addressed            This Visit's Progress   . PharmD - Medication Managment       CARE PLAN ENTRY (see longitudinal plan of care for additional care plan information)  Current Barriers:  . Chronic Disease Management support, education, and care coordination needs related to type 2 diabetes, CKD, HFrEF s/p CardioMEMS, hyperlipidemia, atrial fibrillation, hypertension, coronary artery disease, CHF, hypothyroidism, and CML . Financial o Note patient has Partial Extra Help/LIS Subsidy o Patient enrolled in [place] [employer] health practitioner and Trulicity) and [employer] [place]) patient assistance programs for 2021 calendar year . Limited vision  Pharmacist Clinical Goal(s):  [name] Over the next 30 days, patient will work with CM Pharmacist to address needs related to medication adherence and diabetes management  Interventions: . Perform chart review . Follow up with patient and wife regarding medication adherence ? Note patient's wife manages weekly pillbox for patient ? Confirms patient taking potassium and torsemide as directed by Cardiology . Discuss CBG control and monitoring o Reports patient taking:  [name] 30 untis daily  Trulicity 7.74 mg once weekly  Jardiance 10 mg once daily before breakfast o Patient reports recent AM  CBGs:  Fasting blood sugar  7 - September 138  8 - September 144  9 - September 140  10 - September 149  11 - September 144  12 - September 150  13 - September 128  Average 142   ? Encourage continuing to monitor and keep log of CBGs and bring to upcoming PCP appointment ? Mrs. Manfred reports received a refill of Trulicity 1.28 mg pens as automatically shipped by patient assistance program ~ 2 weeks ago.  ? Reports now has 3 boxes and 2 pens of Trulicity remaining ? Reports patient has 2 boxes of [name] remaining . Place coordination of care call to [employer] patient assistance program. Representative states: ? Unable to remove Trulicity from automatic refill; next scheduled to ship to patient on 11/17 ? Requests provider send in new Rx for [place]. Reports current Rx expired . Will collaborate with PCP regarding plan to increase patient's to Trulicity dose to 1.5 mg weekly (with dose  reduction of [name] at that time) and new Rx for [employer] . Denies any medication questions/concerns today.  Patient Self Care Activities:  . Check blood sugar, blood pressure and daily weight as directed by providers  . Patient takes medications, with assistance of wife, as directed o Mrs. Bhavsar manages patient's medications using weekly pillbox as adherence tool . Patient attends scheduled medical appointments o Next appointment with PCP on 9/15  Please see past updates related to this goal by clicking on the "Past Updates" button in the selected goal         Plan  Telephone follow up appointment with care management team member scheduled for: 10/25 at 10 am  [name], PharmD, [place] [phone]

## 2019-11-29 NOTE — Patient Instructions (Signed)
Thank you allowing the Chronic Care Management Team to be a part of your care! It was a pleasure speaking with you today!     CCM (Chronic Care Management) Team    Noreene Larsson RN, MSN, CCM Nurse Care Coordinator  [phone]   [name] PharmD  Clinical Pharmacist  [phone]   Eula Fried LCSW Clinical Social Worker [phone]  Visit Information  Goals Addressed            This Visit's Progress   . PharmD - Medication Managment       CARE PLAN ENTRY (see longitudinal plan of care for additional care plan information)  Current Barriers:  . Chronic Disease Management support, education, and care coordination needs related to type 2 diabetes, CKD, HFrEF s/p CardioMEMS, hyperlipidemia, atrial fibrillation, hypertension, coronary artery disease, CHF, hypothyroidism, and CML . Financial o Note patient has Partial Extra Help/LIS Subsidy o Patient enrolled in [place] [employer] health practitioner and Trulicity) and [employer] [place]) patient assistance programs for 2021 calendar year . Limited vision  Pharmacist Clinical Goal(s):  [name] Over the next 30 days, patient will work with CM Pharmacist to address needs related to medication adherence and diabetes management  Interventions: . Perform chart review . Follow up with patient and wife regarding medication adherence ? Note patient's wife manages weekly pillbox for patient ? Confirms patient taking potassium and torsemide as directed by Cardiology . Discuss CBG control and monitoring o Reports patient taking:  [name] 30 untis daily  Trulicity 7.59 mg once weekly  Jardiance 10 mg once daily before breakfast o Patient reports recent AM CBGs:  Fasting blood sugar  7 - September 138  8 - September 144  9 - September 140  10 - September 149  11 - September 144  12 - September 150  13 - September 128  Average 142   ? Encourage continuing to monitor and keep log of CBGs and bring to upcoming PCP  appointment ? [name] reports received a refill of Trulicity 1.63 mg pens as automatically shipped by patient assistance program ~ 2 weeks ago.  ? Reports now has 3 boxes and 2 pens of Trulicity remaining ? Reports patient has 2 boxes of [name] remaining . Place coordination of care call to [employer] patient assistance program. Representative states: ? Unable to remove Trulicity from automatic refill; next scheduled to ship to patient on 11/17 ? Requests provider send in new Rx for [place]. Reports current Rx expired . Will collaborate with PCP regarding plan to increase patient's to Trulicity dose to 1.5 mg weekly (with dose reduction of [name] at that time) and new Rx for [employer] . Denies any medication questions/concerns today.  Patient Self Care Activities:  . Check blood sugar, blood pressure and daily weight as directed by providers  . Patient takes medications, with assistance of wife, as directed o Mrs. Dinneen manages patient's medications using weekly pillbox as adherence tool . Patient attends scheduled medical appointments o Next appointment with PCP on 9/15  Please see past updates related to this goal by clicking on the "Past Updates" button in the selected goal         Patient verbalizes understanding of instructions provided today.   Telephone follow up appointment with care management team member scheduled for:  10/25 at 10 am  [name], PharmD, [place] [phone]

## 2019-12-01 ENCOUNTER — Other Ambulatory Visit: Payer: Self-pay

## 2019-12-01 ENCOUNTER — Ambulatory Visit (INDEPENDENT_AMBULATORY_CARE_PROVIDER_SITE_OTHER): Payer: Medicare HMO | Admitting: Family Medicine

## 2019-12-01 VITALS — Resp 16 | Ht 64.0 in | Wt 205.6 lb

## 2019-12-01 DIAGNOSIS — E1121 Type 2 diabetes mellitus with diabetic nephropathy: Secondary | ICD-10-CM

## 2019-12-01 DIAGNOSIS — Z794 Long term (current) use of insulin: Secondary | ICD-10-CM

## 2019-12-01 NOTE — Progress Notes (Signed)
Patient not seen today for medical evaluation. [name] 2 weeks early for A1c. No other medical complaint. Apt scheduled for hospital follow-up but he was not in hospital.  Apt re-scheduled after vitals taken today  [name], [facility] 12/01/2019, 5:28 PM

## 2019-12-03 ENCOUNTER — Ambulatory Visit: Payer: Self-pay | Admitting: Pharmacist

## 2019-12-03 DIAGNOSIS — Z794 Long term (current) use of insulin: Secondary | ICD-10-CM

## 2019-12-03 DIAGNOSIS — E1121 Type 2 diabetes mellitus with diabetic nephropathy: Secondary | ICD-10-CM

## 2019-12-03 DIAGNOSIS — E1169 Type 2 diabetes mellitus with other specified complication: Secondary | ICD-10-CM

## 2019-12-03 DIAGNOSIS — I25118 Atherosclerotic heart disease of native coronary artery with other forms of angina pectoris: Secondary | ICD-10-CM | POA: Diagnosis not present

## 2019-12-03 DIAGNOSIS — J449 Chronic obstructive pulmonary disease, unspecified: Secondary | ICD-10-CM

## 2019-12-03 DIAGNOSIS — I10 Essential (primary) hypertension: Secondary | ICD-10-CM

## 2019-12-03 DIAGNOSIS — I5032 Chronic diastolic (congestive) heart failure: Secondary | ICD-10-CM | POA: Diagnosis not present

## 2019-12-03 DIAGNOSIS — E785 Hyperlipidemia, unspecified: Secondary | ICD-10-CM

## 2019-12-03 NOTE — Patient Instructions (Signed)
Thank you allowing the Chronic Care Management Team to be a part of your care! It was a pleasure speaking with you today!     CCM (Chronic Care Management) Team    Noreene Larsson RN, MSN, CCM Nurse Care Coordinator  [phone]   [name] PharmD  Clinical Pharmacist  [phone]   Eula Fried LCSW Clinical Social Worker [phone]  Visit Information  Goals Addressed            This Visit's Progress   . PharmD - Medication Managment       CARE PLAN ENTRY (see longitudinal plan of care for additional care plan information)  Current Barriers:  . Chronic Disease Management support, education, and care coordination needs related to type 2 diabetes, CKD, HFrEF s/p CardioMEMS, hyperlipidemia, atrial fibrillation, hypertension, coronary artery disease, CHF, hypothyroidism, and CML . Financial o Note patient has Partial Extra Help/LIS Subsidy o Patient enrolled in [place] [employer] health practitioner and Trulicity) and [employer] [place]) patient assistance programs for 2021 calendar year . Limited vision  Pharmacist Clinical Goal(s):  [name] Over the next 30 days, patient will work with CM Pharmacist to address needs related to medication adherence and diabetes management  Interventions: . Collaborated with PCP regarding patient's diabetes medication management and need for new Rx for [name] to assistance program . Place coordination of care call to [facility], dispensing pharmacy for [employer] patient assistance program. Speak with representative who confirms new Rx for [name] received and next refill scheduled to be shipped to office for patient on 9/24. . Follow up with patient's wife to provide her with this update  Patient Self Care Activities:  . Check blood sugar, blood pressure and daily weight as directed by providers  . Patient takes medications, with assistance of wife, as directed o Mrs. Sarin manages patient's medications using weekly pillbox as adherence  tool . Patient attends scheduled medical appointments o Next appointment with PCP on 10/6  Please see past updates related to this goal by clicking on the "Past Updates" button in the selected goal         Patient verbalizes understanding of instructions provided today.   Telephone follow up appointment with care management team member scheduled for: 10/25 at 10 am   [name], PharmD, [place] [phone]

## 2019-12-03 NOTE — Chronic Care Management (AMB) (Signed)
Chronic Care Management   Follow Up Note   12/03/2019 Name: Lucas Caldwell MRN: [id number] DOB: [birth year]  Referred by: [name], DO Reason for referral : Chronic Care Management ([facility])   Lucas Caldwell is a 86 y.o. year old male who is a primary care patient of [name], DO. The CCM team was consulted for assistance with chronic disease management and care coordination needs.    Place coordination of care call to [facility], dispensing pharmacy for [employer]   Review of patient status, including review of consultants reports, relevant laboratory and other test results, and collaboration with appropriate care team members and the patient's provider was performed as part of comprehensive patient evaluation and provision of chronic care management services.    SDOH (Social Determinants of Health) assessments performed: No See Care Plan activities for detailed interventions related to [facility])     Outpatient Encounter Medications as of 12/03/2019  Medication Sig  . ACCU-CHEK AVIVA PLUS test strip Use to check blood sugar up to 2 times daily  . Accu-Chek Softclix Lancets lancets Use to check blood sugar up to 2 times daily.  [name] acetaminophen (TYLENOL) 500 MG tablet Take 1,000 mg by mouth every 6 (six) hours as needed for moderate pain or headache.   . Alcohol Swabs 70 % PADS Use when checking blood sugar up to 3x daily  . allopurinol (ZYLOPRIM) 100 MG tablet Take 1 tablet (100 mg total) by mouth daily.  [name] aspirin 81 MG chewable tablet Chew 81 mg by mouth daily.  . Blood Glucose Monitoring Suppl (ACCU-CHEK AVIVA PLUS) w/Device KIT Use to check blood sugar up to 2 times daily  . bosutinib (BOSULIF) 100 MG tablet Take 100 mg by mouth at bedtime as needed (tired/not feeling well). Take with food.   . Dulaglutide (TRULICITY) 5.68 LE/7.5TZ SOPN Inject 0.75 mg into the skin once a week. (Patient taking differently: Inject 0.75 mg into the skin every  Saturday. )  . empagliflozin (JARDIANCE) 10 MG TABS tablet Take 10 mg by mouth daily before breakfast.  . gabapentin (NEURONTIN) 300 MG capsule TAKE TWO CAPSULES BY MOUTH TWICE DAILY. (MAX DOSE = 1200MG DAILY FOR KIDNEY FUNCTION)  . hydrALAZINE (APRESOLINE) 25 MG tablet Take 1 tablet (25 mg total) by mouth 3 (three) times daily.  . Insulin Glargine ([name] KWIKPEN) 100 UNIT/ML Inject 30 Units into the skin daily.  . Insulin Pen Needle 31G X 5 MM MISC 31 g by Does not apply route as directed.  . isosorbide mononitrate (IMDUR) 60 MG 24 hr tablet Take 1 tablet (60 mg total) by mouth daily.  [name] levothyroxine (SYNTHROID) 25 MCG tablet Take 1 tablet (25 mcg total) by mouth daily before breakfast.  . losartan (COZAAR) 25 MG tablet TAKE ONE TABLET BY MOUTH ONCE DAILY  . Menthol-Zinc Oxide (GOLD BOND EX) Apply 1 application topically daily as needed (muscle pain).  . metoprolol succinate (TOPROL-XL) 25 MG 24 hr tablet TAKE ONE TABLET BY MOUTH ONCE DAILY  . Multiple Vitamin (MULTIVITAMIN WITH MINERALS) TABS tablet Take 1 tablet by mouth daily.  . nitroGLYCERIN (NITROSTAT) 0.4 MG SL tablet Place 1 tablet (0.4 mg total) under the tongue every 5 (five) minutes x 3 doses as needed for chest pain.  [name] omeprazole (PRILOSEC) 20 MG capsule Take 20 mg by mouth in the morning and at bedtime. Morning & afternoon  . Polyethyl Glycol-Propyl Glycol (SYSTANE OP) Place 1 drop into both eyes 3 (three) times daily as needed (  dry/irritated eyes.).   [name] polyethylene glycol (MIRALAX / GLYCOLAX) 17 g packet Take 17 g by mouth daily as needed for mild constipation.   . potassium chloride SA (KLOR-CON) 20 MEQ tablet Take 3 tablets (60 mEq total) by mouth daily. (Patient taking differently: Take 40 mEq by mouth daily. )  . PROVENTIL HFA 108 (90 Base) MCG/ACT inhaler Inhale 2 puffs into the lungs every 6 (six) hours as needed for wheezing or shortness of breath.  . ranolazine (RANEXA) 500 MG 12 hr tablet Take 1 tablet (500 mg total)  by mouth 2 (two) times daily.  . rosuvastatin (CRESTOR) 5 MG tablet TAKE ONE TABLET BY MOUTH ONCE DAILY  . tamsulosin (FLOMAX) 0.4 MG CAPS capsule Take 1 capsule (0.4 mg total) by mouth daily after supper.  . torsemide (DEMADEX) 20 MG tablet Take 3 tablets (60 mg total) by mouth daily.  . traMADol (ULTRAM) 50 MG tablet Take 1-2 tablets (50-100 mg total) by mouth at bedtime as needed. (Patient taking differently: Take 50-100 mg by mouth at bedtime as needed (pain.). )  . warfarin (COUMADIN) 3 MG tablet TAKE 1 TO 1 & 1/2 TABLETS ONCE A DAY AS DIRECTED BY THE COUMADIN CLINIC   No facility-administered encounter medications on file as of 12/03/2019.    Goals Addressed            This Visit's Progress   . PharmD - Medication Managment       CARE PLAN ENTRY (see longitudinal plan of care for additional care plan information)  Current Barriers:  . Chronic Disease Management support, education, and care coordination needs related to type 2 diabetes, CKD, HFrEF s/p CardioMEMS, hyperlipidemia, atrial fibrillation, hypertension, coronary artery disease, CHF, hypothyroidism, and CML . Financial o Note patient has Partial Extra Help/LIS Subsidy o Patient enrolled in [place] [facility] health practitioner and [employer]) and [employer]) patient assistance programs for 2021 calendar year . Limited vision  Pharmacist Clinical Goal(s):  [name] Over the next 30 days, patient will work with CM Pharmacist to address needs related to medication adherence and diabetes management  Interventions: . Collaborated with PCP regarding patient's diabetes medication management and need for new Rx for [name] to assistance program . Place coordination of care call to [facility], dispensing pharmacy for [employer] patient assistance program. Speak with representative who confirms new Rx for [name] received and next refill scheduled to be shipped to office for patient on 9/24. . Follow up with patient's wife to provide  her with this update  Patient Self Care Activities:  . Check blood sugar, blood pressure and daily weight as directed by providers  . Patient takes medications, with assistance of wife, as directed o Mrs. Burgo manages patient's medications using weekly pillbox as adherence tool . Patient attends scheduled medical appointments o Next appointment with PCP on 10/6  Please see past updates related to this goal by clicking on the "Past Updates" button in the selected goal         Plan  Telephone follow up appointment with care management team member scheduled for: 10/25 at 10 am  [name], PharmD, [place] [phone]

## 2019-12-07 DIAGNOSIS — H18599 Other hereditary corneal dystrophies, unspecified eye: Secondary | ICD-10-CM | POA: Diagnosis not present

## 2019-12-07 DIAGNOSIS — E119 Type 2 diabetes mellitus without complications: Secondary | ICD-10-CM | POA: Diagnosis not present

## 2019-12-07 DIAGNOSIS — H35342 Macular cyst, hole, or pseudohole, left eye: Secondary | ICD-10-CM | POA: Diagnosis not present

## 2019-12-07 DIAGNOSIS — H35372 Puckering of macula, left eye: Secondary | ICD-10-CM | POA: Diagnosis not present

## 2019-12-07 LAB — HM DIABETES EYE EXAM

## 2019-12-13 ENCOUNTER — Telehealth: Payer: Self-pay

## 2019-12-13 DIAGNOSIS — Z0100 Encounter for examination of eyes and vision without abnormal findings: Secondary | ICD-10-CM | POA: Diagnosis not present

## 2019-12-13 NOTE — Telephone Encounter (Signed)
I called and notified the patient that his basaglar is ready for pick up.

## 2019-12-15 ENCOUNTER — Other Ambulatory Visit: Payer: Self-pay | Admitting: Family Medicine

## 2019-12-15 ENCOUNTER — Ambulatory Visit: Admit: 2019-12-15 | Discharge: 2019-12-15 | Payer: MEDICARE

## 2019-12-15 ENCOUNTER — Other Ambulatory Visit: Admit: 2019-12-15 | Discharge: 2019-12-15 | Payer: MEDICARE

## 2019-12-15 DIAGNOSIS — C9210 Chronic myeloid leukemia, BCR/ABL-positive, not having achieved remission: Principal | ICD-10-CM

## 2019-12-15 DIAGNOSIS — I480 Paroxysmal atrial fibrillation: Secondary | ICD-10-CM | POA: Diagnosis not present

## 2019-12-15 DIAGNOSIS — E1121 Type 2 diabetes mellitus with diabetic nephropathy: Secondary | ICD-10-CM

## 2019-12-15 DIAGNOSIS — Z79890 Hormone replacement therapy (postmenopausal): Secondary | ICD-10-CM | POA: Diagnosis not present

## 2019-12-15 DIAGNOSIS — Z794 Long term (current) use of insulin: Secondary | ICD-10-CM | POA: Diagnosis not present

## 2019-12-15 DIAGNOSIS — I130 Hypertensive heart and chronic kidney disease with heart failure and stage 1 through stage 4 chronic kidney disease, or unspecified chronic kidney disease: Secondary | ICD-10-CM | POA: Diagnosis not present

## 2019-12-15 DIAGNOSIS — E1122 Type 2 diabetes mellitus with diabetic chronic kidney disease: Secondary | ICD-10-CM | POA: Diagnosis not present

## 2019-12-15 DIAGNOSIS — Z7901 Long term (current) use of anticoagulants: Secondary | ICD-10-CM | POA: Diagnosis not present

## 2019-12-15 DIAGNOSIS — N189 Chronic kidney disease, unspecified: Secondary | ICD-10-CM | POA: Diagnosis not present

## 2019-12-15 DIAGNOSIS — I509 Heart failure, unspecified: Secondary | ICD-10-CM | POA: Diagnosis not present

## 2019-12-15 DIAGNOSIS — Z23 Encounter for immunization: Secondary | ICD-10-CM | POA: Diagnosis not present

## 2019-12-15 MED ORDER — BOSUTINIB 100 MG TABLET
ORAL_TABLET | Freq: Every day | ORAL | 11 refills | 30.00000 days | Status: CP
Start: 2019-12-15 — End: ?

## 2019-12-16 ENCOUNTER — Telehealth: Payer: Medicare HMO | Admitting: General Practice

## 2019-12-16 ENCOUNTER — Ambulatory Visit: Payer: Self-pay | Admitting: General Practice

## 2019-12-16 DIAGNOSIS — I10 Essential (primary) hypertension: Secondary | ICD-10-CM

## 2019-12-16 DIAGNOSIS — J449 Chronic obstructive pulmonary disease, unspecified: Secondary | ICD-10-CM

## 2019-12-16 DIAGNOSIS — I5032 Chronic diastolic (congestive) heart failure: Secondary | ICD-10-CM

## 2019-12-16 DIAGNOSIS — I25118 Atherosclerotic heart disease of native coronary artery with other forms of angina pectoris: Secondary | ICD-10-CM

## 2019-12-16 DIAGNOSIS — E785 Hyperlipidemia, unspecified: Secondary | ICD-10-CM

## 2019-12-16 DIAGNOSIS — E1121 Type 2 diabetes mellitus with diabetic nephropathy: Secondary | ICD-10-CM

## 2019-12-16 MED ORDER — RELION TRUE METRIX TEST STRIPS VI STRP: ORAL_STRIP | 3 refills | Status: DC

## 2019-12-16 MED ORDER — BD SWAB SINGLE USE REGULAR PADS: MEDICATED_PAD | 3 refills | Status: DC

## 2019-12-16 MED ORDER — TRUE METRIX METER DEVI: 0 refills | Status: DC

## 2019-12-16 NOTE — Chronic Care Management (AMB) (Signed)
Chronic Care Management   Follow Up Note   12/16/2019 Name: Lucas Caldwell MRN: [id number] DOB: June 21, 1933  Referred by: [name], DO Reason for referral : Chronic Care Management (RNCM Follow up call: Chronic Disease Management and care coordination needs)   Lucas Caldwell is a 86 y.o. year old male who is a primary care patient of [name], DO. The CCM team was consulted for assistance with chronic disease management and care coordination needs.    Review of patient status, including review of consultants reports, relevant laboratory and other test results, and collaboration with appropriate care team members and the patient's provider was performed as part of comprehensive patient evaluation and provision of chronic care management services.    SDOH (Social Determinants of Health) assessments performed: Yes See Care Plan activities for detailed interventions related to [facility])     Outpatient Encounter Medications as of 12/16/2019  Medication Sig  . ACCU-CHEK AVIVA PLUS test strip Use to check blood sugar up to 2 times daily  . Accu-Chek Softclix Lancets lancets Use to check blood sugar up to 2 times daily.  [name] acetaminophen (TYLENOL) 500 MG tablet Take 1,000 mg by mouth every 6 (six) hours as needed for moderate pain or headache.   . Alcohol Swabs 70 % PADS Use when checking blood sugar up to 3x daily  . allopurinol (ZYLOPRIM) 100 MG tablet Take 1 tablet (100 mg total) by mouth daily.  [name] aspirin 81 MG chewable tablet Chew 81 mg by mouth daily.  . Blood Glucose Monitoring Suppl (ACCU-CHEK AVIVA PLUS) w/Device KIT Use to check blood sugar up to 2 times daily  . bosutinib (BOSULIF) 100 MG tablet Take 100 mg by mouth at bedtime as needed (tired/not feeling well). Take with food.   . Dulaglutide (TRULICITY) 3.22 GU/5.4YH SOPN Inject 0.75 mg into the skin once a week. (Patient taking differently: Inject 0.75 mg into the skin every Saturday. )  .  empagliflozin (JARDIANCE) 10 MG TABS tablet Take 10 mg by mouth daily before breakfast.  . gabapentin (NEURONTIN) 300 MG capsule TAKE TWO CAPSULES BY MOUTH TWICE DAILY. (MAX DOSE = 1200MG DAILY FOR KIDNEY FUNCTION)  . hydrALAZINE (APRESOLINE) 25 MG tablet Take 1 tablet (25 mg total) by mouth 3 (three) times daily.  . Insulin Glargine ([name] KWIKPEN) 100 UNIT/ML Inject 30 Units into the skin daily.  . Insulin Pen Needle 31G X 5 MM MISC 31 g by Does not apply route as directed.  . isosorbide mononitrate (IMDUR) 60 MG 24 hr tablet Take 1 tablet (60 mg total) by mouth daily.  [name] levothyroxine (SYNTHROID) 25 MCG tablet Take 1 tablet (25 mcg total) by mouth daily before breakfast.  . losartan (COZAAR) 25 MG tablet TAKE ONE TABLET BY MOUTH ONCE DAILY  . Menthol-Zinc Oxide (GOLD BOND EX) Apply 1 application topically daily as needed (muscle pain).  . metoprolol succinate (TOPROL-XL) 25 MG 24 hr tablet TAKE ONE TABLET BY MOUTH ONCE DAILY  . Multiple Vitamin (MULTIVITAMIN WITH MINERALS) TABS tablet Take 1 tablet by mouth daily.  . nitroGLYCERIN (NITROSTAT) 0.4 MG SL tablet Place 1 tablet (0.4 mg total) under the tongue every 5 (five) minutes x 3 doses as needed for chest pain.  [name] omeprazole (PRILOSEC) 20 MG capsule Take 20 mg by mouth in the morning and at bedtime. Morning & afternoon  . Polyethyl Glycol-Propyl Glycol (SYSTANE OP) Place 1 drop into both eyes 3 (three) times daily as needed (dry/irritated eyes.).   [name] polyethylene  glycol (MIRALAX / GLYCOLAX) 17 g packet Take 17 g by mouth daily as needed for mild constipation.   . potassium chloride SA (KLOR-CON) 20 MEQ tablet Take 3 tablets (60 mEq total) by mouth daily. (Patient taking differently: Take 40 mEq by mouth daily. )  . PROVENTIL HFA 108 (90 Base) MCG/ACT inhaler Inhale 2 puffs into the lungs every 6 (six) hours as needed for wheezing or shortness of breath.  . ranolazine (RANEXA) 500 MG 12 hr tablet Take 1 tablet (500 mg total) by mouth 2 (two)  times daily.  . rosuvastatin (CRESTOR) 5 MG tablet TAKE ONE TABLET BY MOUTH ONCE DAILY  . tamsulosin (FLOMAX) 0.4 MG CAPS capsule Take 1 capsule (0.4 mg total) by mouth daily after supper.  . torsemide (DEMADEX) 20 MG tablet Take 3 tablets (60 mg total) by mouth daily.  . traMADol (ULTRAM) 50 MG tablet Take 1-2 tablets (50-100 mg total) by mouth at bedtime as needed. (Patient taking differently: Take 50-100 mg by mouth at bedtime as needed (pain.). )  . warfarin (COUMADIN) 3 MG tablet TAKE 1 TO 1 & 1/2 TABLETS ONCE A DAY AS DIRECTED BY THE COUMADIN CLINIC   No facility-administered encounter medications on file as of 12/16/2019.     Objective:   Goals Addressed              This Visit's Progress   .  RN- I am getting more short winded but I feel better now (pt-stated)        Current Barriers:  [name] Knowledge deficit related to basic heart failure pathophysiology and self care management   Case Manager Clinical Goal(s):  [name] Over the next 120 days, patient will verbalize understanding of Heart Failure Action Plan and when to call doctor . Over the next 120 days, patient will take all Heart Failure mediations as prescribed . Over the next 120 days, patient will weigh daily and record (notifying MD of 3 lb weight gain over night or 5 lb in a week) . Over the next 120 days, patient will take blood pressure daily and record, notifying the provider for blood pressure outside of set parameters  Interventions:  . Basic overview and discussion of pathophysiology of Heart Failure reviewed  . Provided verbal education on low sodium diet, per the patient wife he is doing better with dietary restrictions. The patients wife continues to monitor and help patient  . Discussed importance of daily weight and advised patient to weigh and record daily: patient is now weighing daily and recording, weight on 8-4 was 203.  The patients wife was not at home and the patient states that she keeps up with all of  that "stuff" for him. 12-16-2019: the patient has a "pillow" that he gets on each day and it measures his weight and sends it the the cardiologist.  If he has extra fluid on board it alerts the provider and they call the patient and give instructions. The patients wife states he does this every day and they have not called but they do get the readings. It is a new thing they are doing and it is working well for him.  [name] taking "3" fluid pills a day. The wife helps the patient keep up with his medications, appointments, and daily routines.  . Reviewed role of diuretics in prevention of fluid overload and management of heart failure.  His diuretic regimen is working well for him at this time. 12-16-2019: Review education and support.   [name]  Discussed s/s of HF and increased fluid. Patient verbalized that he is feeling much better and went today to have a "test".  (ECHO- 04/22/2019 with an EF% of 30-35%) 12-16-2019: The patient is stable at this time. His wife verbalized he will follow up with the cardiologist next month.  . Evaluation of oxygen needs: Patient reports using 02 at night successfully. The patient expressed he usually wakes up in the middle of the night and the oxygen has dried his nose out. He usually takes it off then and goes back to sleep without incident.  The patient does use oxygen during the day if he needs it. No acute distress noted. . Discussed the importance of [place] and the patient states PT and a nurse are coming into the home to check on him regularly. The patient feels he is improving. [name] ejection fraction: 30-35% 04-22-2019   Wt Readings from Last 3 Encounters:  12/01/19 205 lb 9.6 oz (93.3 kg)  11/24/19 203 lb 6 oz (92.3 kg)  11/04/19 204 lb 3.2 oz (92.6 kg)    BP Readings from Last 3 Encounters:  11/24/19 120/64  11/04/19 110/80  10/20/19 122/80            Patient Self Care Activities:  . Takes Heart Failure Medications as prescribed . Weighs daily and record (notifying MD of  3 lb weight gain over night or 5 lb in a week) . Verbalizes understanding of and follows CHF Action Plan . Adheres to low sodium diet  Please see past updates related to this goal by clicking on the "Past Updates" button in the selected goal      .  RNCM: We doing better at watching his blood pressure and sugars        Current Barriers:  . Chronic Disease Management support, education, and care coordination needs related to HTN, COPD, and DMII  Clinical Goal(s) related to  HTN, COPD, and DMII:  Over the next 120 days, patient will:  . Work with the care management team to address educational, disease management, and care coordination needs  . Begin or continue self health monitoring activities as directed today Measure and record cbg (blood glucose) 1 times daily, Measure and record blood pressure 5 times per week, Measure and record weight daily, and adhere to a heart healthy/ADA diet . Call provider office for new or worsened signs and symptoms Blood glucose findings outside established parameters, Blood pressure findings outside established parameters, Weight outside established parameters, Oxygen saturation lower than established parameter, Shortness of breath, and New or worsened symptom related to Chronic health conditions . Call care management team with questions or concerns . Verbalize basic understanding of patient centered plan of care established today  Interventions related to  HTN, COPD, and DMII: . Evaluation of current treatment plans and patient's adherence to plan as established by provider.  12-16-2019: the patient states he feels he is doing very well right now with his chronic health conditions. Denies any acute distress. Sees providers regularly.  . Assessed patient understanding of disease states- the patient verbalized he does well and his wife keeps up with all the things he needs. 12-16-2019: The patient's wife states the patient is stable at this time. Denies any new  concerns at this time.  . Assessed patient's education and care coordination needs.  Ongoing support and education on maintaining a diet low in sodium and sugars. 12-16-2019: The patient is still waiting on his new glucose meter from [facility]. The patients wife  said she thought it would be here by now. Advised the wife to call [facility] and inquire about shipment. The wife states that she has a tracking number. She also states that he sill is using his other meter but needs a battery for it. Review of watching for hypoglycemia.  . Provided disease specific education to patient. 12-16-2019: Reminded the patient of the CCM team available to assist as needed and to continue to follow up with providers on a regular basis. Next appointment with the pcp on 12-22-2019. Nash Dimmer with appropriate clinical care team members regarding patient needs: CCM pharmacist available and working with the patient also.  . Evaluation of sx/sx of shortness of breath. The patient denies shortness of breath at this times. Does admit he has some when he is active. Feels he has improved overall. 12-16-2019: The patient saw his cancer doctor yesterday and got a good report. The patient also received his COVID 19 booster shot. Vaccination record updated accordingly. The patients wife states he is doing fine and denies any side effects from the booster.   Patient Self Care Activities related to CHF, HTN, DMII, and increased shortness of breath with recent hospitalization and ER visit:  . Patient is unable to independently self-manage chronic health conditions  Please see past updates related to this goal by clicking on the "Past Updates" button in the selected goal          Plan:   Telephone follow up appointment with care management team member scheduled for: 01-20-2020 at [place] am   [place], MSN, [place] Mobile: [phone]

## 2019-12-16 NOTE — Patient Instructions (Signed)
Visit Information  Goals Addressed              This Visit's Progress   .  RN- I am getting more short winded but I feel better now (pt-stated)        Current Barriers:  [name] Knowledge deficit related to basic heart failure pathophysiology and self care management   Case Manager Clinical Goal(s):  [name] Over the next 120 days, patient will verbalize understanding of Heart Failure Action Plan and when to call doctor . Over the next 120 days, patient will take all Heart Failure mediations as prescribed . Over the next 120 days, patient will weigh daily and record (notifying MD of 3 lb weight gain over night or 5 lb in a week) . Over the next 120 days, patient will take blood pressure daily and record, notifying the provider for blood pressure outside of set parameters  Interventions:  . Basic overview and discussion of pathophysiology of Heart Failure reviewed  . Provided verbal education on low sodium diet, per the patient wife he is doing better with dietary restrictions. The patients wife continues to monitor and help patient  . Discussed importance of daily weight and advised patient to weigh and record daily: patient is now weighing daily and recording, weight on 8-4 was 203.  The patients wife was not at home and the patient states that she keeps up with all of that "stuff" for him. 12-16-2019: the patient has a "pillow" that he gets on each day and it measures his weight and sends it the the cardiologist.  If he has extra fluid on board it alerts the provider and they call the patient and give instructions. The patients wife states he does this every day and they have not called but they do get the readings. It is a new thing they are doing and it is working well for him.  [name] taking "3" fluid pills a day. The wife helps the patient keep up with his medications, appointments, and daily routines.  . Reviewed role of diuretics in prevention of fluid overload and management of heart failure.  His  diuretic regimen is working well for him at this time. 12-16-2019: Review education and support.   . Discussed s/s of HF and increased fluid. Patient verbalized that he is feeling much better and went today to have a "test".  (ECHO- 04/22/2019 with an EF% of 30-35%) 12-16-2019: The patient is stable at this time. His wife verbalized he will follow up with the cardiologist next month.  . Evaluation of oxygen needs: Patient reports using 02 at night successfully. The patient expressed he usually wakes up in the middle of the night and the oxygen has dried his nose out. He usually takes it off then and goes back to sleep without incident.  The patient does use oxygen during the day if he needs it. No acute distress noted. . Discussed the importance of [place] and the patient states PT and a nurse are coming into the home to check on him regularly. The patient feels he is improving. [name] ejection fraction: 30-35% 04-22-2019   Wt Readings from Last 3 Encounters:  12/01/19 205 lb 9.6 oz (93.3 kg)  11/24/19 203 lb 6 oz (92.3 kg)  11/04/19 204 lb 3.2 oz (92.6 kg)    BP Readings from Last 3 Encounters:  11/24/19 120/64  11/04/19 110/80  10/20/19 122/80            Patient Self Care Activities:  .  Takes Heart Failure Medications as prescribed . Weighs daily and record (notifying MD of 3 lb weight gain over night or 5 lb in a week) . Verbalizes understanding of and follows CHF Action Plan . Adheres to low sodium diet  Please see past updates related to this goal by clicking on the "Past Updates" button in the selected goal      .  RNCM: We doing better at watching his blood pressure and sugars        Current Barriers:  . Chronic Disease Management support, education, and care coordination needs related to HTN, COPD, and DMII  Clinical Goal(s) related to  HTN, COPD, and DMII:  Over the next 120 days, patient will:  . Work with the care management team to address educational, disease management, and care  coordination needs  . Begin or continue self health monitoring activities as directed today Measure and record cbg (blood glucose) 1 times daily, Measure and record blood pressure 5 times per week, Measure and record weight daily, and adhere to a heart healthy/ADA diet . Call provider office for new or worsened signs and symptoms Blood glucose findings outside established parameters, Blood pressure findings outside established parameters, Weight outside established parameters, Oxygen saturation lower than established parameter, Shortness of breath, and New or worsened symptom related to Chronic health conditions . Call care management team with questions or concerns . Verbalize basic understanding of patient centered plan of care established today  Interventions related to  HTN, COPD, and DMII: . Evaluation of current treatment plans and patient's adherence to plan as established by provider.  12-16-2019: the patient states he feels he is doing very well right now with his chronic health conditions. Denies any acute distress. Sees providers regularly.  . Assessed patient understanding of disease states- the patient verbalized he does well and his wife keeps up with all the things he needs. 12-16-2019: The patient's wife states the patient is stable at this time. Denies any new concerns at this time.  . Assessed patient's education and care coordination needs.  Ongoing support and education on maintaining a diet low in sodium and sugars. 12-16-2019: The patient is still waiting on his new glucose meter from [facility]. The patients wife said she thought it would be here by now. Advised the wife to call [facility] and inquire about shipment. The wife states that she has a tracking number. She also states that he sill is using his other meter but needs a battery for it. Review of watching for hypoglycemia.  . Provided disease specific education to patient. 12-16-2019: Reminded the patient of the CCM team available to  assist as needed and to continue to follow up with providers on a regular basis. Next appointment with the pcp on 12-22-2019. Nash Dimmer with appropriate clinical care team members regarding patient needs: CCM pharmacist available and working with the patient also.  . Evaluation of sx/sx of shortness of breath. The patient denies shortness of breath at this times. Does admit he has some when he is active. Feels he has improved overall. 12-16-2019: The patient saw his cancer doctor yesterday and got a good report. The patient also received his COVID 19 booster shot. Vaccination record updated accordingly. The patients wife states he is doing fine and denies any side effects from the booster.   Patient Self Care Activities related to CHF, HTN, DMII, and increased shortness of breath with recent hospitalization and ER visit:  . Patient is unable to independently self-manage chronic health  conditions  Please see past updates related to this goal by clicking on the "Past Updates" button in the selected goal         Patient verbalizes understanding of instructions provided today.   Telephone follow up appointment with care management team member scheduled for: 01-20-2020 at [place] am  Noreene Larsson RN, MSN, [place] [facility] Mobile: [phone]

## 2019-12-17 ENCOUNTER — Telehealth: Payer: Self-pay | Admitting: Internal Medicine

## 2019-12-17 ENCOUNTER — Encounter: Payer: Self-pay | Admitting: Family Medicine

## 2019-12-17 ENCOUNTER — Ambulatory Visit: Payer: Medicare HMO | Admitting: Pharmacist

## 2019-12-17 DIAGNOSIS — E1121 Type 2 diabetes mellitus with diabetic nephropathy: Secondary | ICD-10-CM

## 2019-12-17 NOTE — Telephone Encounter (Signed)
Agree with RN recommendations. He should not stop diuretic and follow up with PCP for incontinence issues.

## 2019-12-17 NOTE — Telephone Encounter (Signed)
Pt c/o medication issue:  1. Name of Medication:   2. How are you currently taking this medication (dosage and times per day)? Fluid pill  3. Are you having a reaction (difficulty breathing--STAT)?  Unable to hold his water   4. What is your medication issue?  Patient wants to know if he is taking to much   Please call

## 2019-12-17 NOTE — Chronic Care Management (AMB) (Signed)
Chronic Care Management   Follow Up Note   12/17/2019 Name: Lucas Caldwell MRN: [id number] DOB: 1933/11/30  Referred by: [name], DO Reason for referral : Chronic Care Management (Caregiver Phone Call)   Lucas Caldwell is a 86 y.o. year old male who is a primary care patient of [name], DO. The CCM team was consulted for assistance with chronic disease management and care coordination needs.    Receive voicemail from patient's wife requesting a call back.  I reached out to patient's wife by phone today.   Review of patient status, including review of consultants reports, relevant laboratory and other test results, and collaboration with appropriate care team members and the patient's provider was performed as part of comprehensive patient evaluation and provision of chronic care management services.    SDOH (Social Determinants of Health) assessments performed: No See Care Plan activities for detailed interventions related to [facility])     Outpatient Encounter Medications as of 12/17/2019  Medication Sig  . Accu-Chek Softclix Lancets lancets Use to check blood sugar up to 2 times daily.  [name] acetaminophen (TYLENOL) 500 MG tablet Take 1,000 mg by mouth every 6 (six) hours as needed for moderate pain or headache.   . Alcohol Swabs (B-D SINGLE USE SWABS REGULAR) PADS Use to check blood sugar up to 2 times daily  . Alcohol Swabs 70 % PADS Use when checking blood sugar up to 3x daily  . allopurinol (ZYLOPRIM) 100 MG tablet Take 1 tablet (100 mg total) by mouth daily.  [name] aspirin 81 MG chewable tablet Chew 81 mg by mouth daily.  . Blood Glucose Monitoring Suppl (ACCU-CHEK AVIVA PLUS) w/Device KIT Use to check blood sugar up to 2 times daily  . Blood Glucose Monitoring Suppl (TRUE METRIX METER) DEVI Use to check blood sugar up to 2 times daily  . bosutinib (BOSULIF) 100 MG tablet Take 100 mg by mouth at bedtime as needed (tired/not feeling well). Take with  food.   . Dulaglutide (TRULICITY) 7.62 UQ/3.3HL SOPN Inject 0.75 mg into the skin once a week. (Patient taking differently: Inject 0.75 mg into the skin every Saturday. )  . empagliflozin (JARDIANCE) 10 MG TABS tablet Take 10 mg by mouth daily before breakfast.  . gabapentin (NEURONTIN) 300 MG capsule TAKE TWO CAPSULES BY MOUTH TWICE DAILY. (MAX DOSE = 1200MG DAILY FOR KIDNEY FUNCTION)  . hydrALAZINE (APRESOLINE) 25 MG tablet Take 1 tablet (25 mg total) by mouth 3 (three) times daily.  . Insulin Glargine ([name] KWIKPEN) 100 UNIT/ML Inject 30 Units into the skin daily.  . Insulin Pen Needle 31G X 5 MM MISC 31 g by Does not apply route as directed.  . isosorbide mononitrate (IMDUR) 60 MG 24 hr tablet Take 1 tablet (60 mg total) by mouth daily.  [name] levothyroxine (SYNTHROID) 25 MCG tablet Take 1 tablet (25 mcg total) by mouth daily before breakfast.  . losartan (COZAAR) 25 MG tablet TAKE ONE TABLET BY MOUTH ONCE DAILY  . Menthol-Zinc Oxide (GOLD BOND EX) Apply 1 application topically daily as needed (muscle pain).  . metoprolol succinate (TOPROL-XL) 25 MG 24 hr tablet TAKE ONE TABLET BY MOUTH ONCE DAILY  . Multiple Vitamin (MULTIVITAMIN WITH MINERALS) TABS tablet Take 1 tablet by mouth daily.  . nitroGLYCERIN (NITROSTAT) 0.4 MG SL tablet Place 1 tablet (0.4 mg total) under the tongue every 5 (five) minutes x 3 doses as needed for chest pain.  [name] omeprazole (PRILOSEC) 20 MG capsule Take 20 mg  by mouth in the morning and at bedtime. Morning & afternoon  . Polyethyl Glycol-Propyl Glycol (SYSTANE OP) Place 1 drop into both eyes 3 (three) times daily as needed (dry/irritated eyes.).   [name] polyethylene glycol (MIRALAX / GLYCOLAX) 17 g packet Take 17 g by mouth daily as needed for mild constipation.   . potassium chloride SA (KLOR-CON) 20 MEQ tablet Take 3 tablets (60 mEq total) by mouth daily. (Patient taking differently: Take 40 mEq by mouth daily. )  . PROVENTIL HFA 108 (90 Base) MCG/ACT inhaler Inhale 2  puffs into the lungs every 6 (six) hours as needed for wheezing or shortness of breath.  . ranolazine (RANEXA) 500 MG 12 hr tablet Take 1 tablet (500 mg total) by mouth 2 (two) times daily.  [name] RELION TRUE METRIX TEST STRIPS test strip Use to check blood sugar up to 2 times daily  . rosuvastatin (CRESTOR) 5 MG tablet TAKE ONE TABLET BY MOUTH ONCE DAILY  . tamsulosin (FLOMAX) 0.4 MG CAPS capsule Take 1 capsule (0.4 mg total) by mouth daily after supper.  . torsemide (DEMADEX) 20 MG tablet Take 3 tablets (60 mg total) by mouth daily.  . traMADol (ULTRAM) 50 MG tablet Take 1-2 tablets (50-100 mg total) by mouth at bedtime as needed. (Patient taking differently: Take 50-100 mg by mouth at bedtime as needed (pain.). )  . warfarin (COUMADIN) 3 MG tablet TAKE 1 TO 1 & 1/2 TABLETS ONCE A DAY AS DIRECTED BY THE COUMADIN CLINIC   No facility-administered encounter medications on file as of 12/17/2019.    Goals Addressed            This Visit's Progress   . PharmD - Medication Managment       CARE PLAN ENTRY (see longitudinal plan of care for additional care plan information)  Current Barriers:  . Chronic Disease Management support, education, and care coordination needs related to type 2 diabetes, CKD, HFrEF s/p CardioMEMS, hyperlipidemia, atrial fibrillation, hypertension, coronary artery disease, CHF, hypothyroidism, and CML . Financial o Note patient has Partial Extra Help/LIS Subsidy o Patient enrolled in [place] Environmental health practitioner and [employer]) and [employer] [place]) patient assistance programs for 2021 calendar year . Limited vision  Pharmacist Clinical Goal(s):  [name] Over the next 30 days, patient will work with CM Pharmacist to address needs related to medication adherence and diabetes management  Interventions: . Return call to patient's wife/caregiver regarding concern about glucometer ? Mrs. Kohles reports she has followed up with [facility] and was informed True Metrix meter  and supplies have been shipped ? Note True Metrix meter new device for patient (previously using Accu-Check Aviva).  ? Caregiver reports patient does not have access to Internet for review of how to use video. ? Advise for caregiver/patient to review instruction booklet that comes with meter and review resources to call for questions ? Mrs. Borgmeyer states will bring meter with to patient's upcoming appointment with PCP on 10/6 if needing further assistance. . Denies further medication questions/concerns on behalf of patient today.  Patient Self Care Activities:  . Check blood sugar, blood pressure and daily weight as directed by providers  . Patient takes medications, with assistance of wife, as directed o Mrs. Langenberg manages patient's medications using weekly pillbox as adherence tool . Patient attends scheduled medical appointments o Next appointment with PCP on 10/6  Please see past updates related to this goal by clicking on the "Past Updates" button in the selected goal  Plan  Telephone follow up appointment with care management team member scheduled for: 10/25  [name], PharmD, [facility] [phone]

## 2019-12-17 NOTE — Patient Instructions (Signed)
Thank you allowing the Chronic Care Management Team to be a part of your care! It was a pleasure speaking with you today!     CCM (Chronic Care Management) Team    Noreene Larsson RN, MSN, CCM Nurse Care Coordinator  [phone]   [name] PharmD  Clinical Pharmacist  [phone]   Eula Fried LCSW Clinical Social Worker [phone]  Visit Information  Goals Addressed            This Visit's Progress   . PharmD - Medication Managment       CARE PLAN ENTRY (see longitudinal plan of care for additional care plan information)  Current Barriers:  . Chronic Disease Management support, education, and care coordination needs related to type 2 diabetes, CKD, HFrEF s/p CardioMEMS, hyperlipidemia, atrial fibrillation, hypertension, coronary artery disease, CHF, hypothyroidism, and CML . Financial o Note patient has Partial Extra Help/LIS Subsidy o Patient enrolled in [place] Environmental health practitioner and [employer]) and [employer] [place]) patient assistance programs for 2021 calendar year . Limited vision  Pharmacist Clinical Goal(s):  [name] Over the next 30 days, patient will work with CM Pharmacist to address needs related to medication adherence and diabetes management  Interventions: . Return call to patient's wife/caregiver regarding concern about glucometer ? Mrs. Tavella reports she has followed up with [facility] and was informed True Metrix meter and supplies have been shipped ? Note True Metrix meter new device for patient (previously using Accu-Check Aviva).  ? Caregiver reports patient does not have access to Internet for review of how to use video. ? Advise for caregiver/patient to review instruction booklet that comes with meter and review resources to call for questions ? Mrs. Heal states will bring meter with to patient's upcoming appointment with PCP on 10/6 if needing further assistance. . Denies further medication questions/concerns on behalf of patient  today.  Patient Self Care Activities:  . Check blood sugar, blood pressure and daily weight as directed by providers  . Patient takes medications, with assistance of wife, as directed o Mrs. Gonder manages patient's medications using weekly pillbox as adherence tool . Patient attends scheduled medical appointments o Next appointment with PCP on 10/6  Please see past updates related to this goal by clicking on the "Past Updates" button in the selected goal         Patient verbalizes understanding of instructions provided today.   Telephone follow up appointment with care management team member scheduled for: 10/25  [name], PharmD, [place] [employer] [phone]

## 2019-12-17 NOTE — Telephone Encounter (Signed)
Wife calling saying patient is unable to make it to the bathroom sometimes after taking his Torsemide. She says its been going on for sometime.  He was at a great-grandchild's ball game yesterday and did not make it in time and wet the front of his pants. I suggested patient may need to wear men's depends to prevent these circumstances and check with PCP (Appt coming up next week) to see if referral to urology is appropriate. Advised he should continue taking diuretic and try the depends when out in public or at a place where getting to the restroom quickly would be difficult. Denies burning with urination or fever or flank pain.   She verbalized understanding and was very Patent attorney. Routing to [employer], [place] for review.

## 2019-12-22 ENCOUNTER — Other Ambulatory Visit: Payer: Self-pay

## 2019-12-22 ENCOUNTER — Encounter: Payer: Self-pay | Admitting: Family Medicine

## 2019-12-22 ENCOUNTER — Ambulatory Visit (INDEPENDENT_AMBULATORY_CARE_PROVIDER_SITE_OTHER): Payer: Medicare HMO | Admitting: Family Medicine

## 2019-12-22 VITALS — BP 121/61 | HR 80 | Temp 97.1°F | Resp 16 | Ht 64.0 in | Wt 207.0 lb

## 2019-12-22 DIAGNOSIS — N3946 Mixed incontinence: Secondary | ICD-10-CM | POA: Diagnosis not present

## 2019-12-22 DIAGNOSIS — Z794 Long term (current) use of insulin: Secondary | ICD-10-CM

## 2019-12-22 DIAGNOSIS — E1142 Type 2 diabetes mellitus with diabetic polyneuropathy: Secondary | ICD-10-CM

## 2019-12-22 DIAGNOSIS — N3941 Urge incontinence: Secondary | ICD-10-CM | POA: Insufficient documentation

## 2019-12-22 DIAGNOSIS — E1121 Type 2 diabetes mellitus with diabetic nephropathy: Secondary | ICD-10-CM

## 2019-12-22 LAB — POCT GLYCOSYLATED HEMOGLOBIN (HGB A1C): Hemoglobin A1C: 7.4 % — AB (ref 4.0–5.6)

## 2019-12-22 MED ORDER — TRAMADOL HCL 50 MG PO TABS: 50.0000 mg | ORAL_TABLET | Freq: Every evening | ORAL | 2 refills | Status: DC | PRN

## 2019-12-22 NOTE — Progress Notes (Signed)
Subjective:    Patient ID: Lucas Caldwell, male    DOB: 12/19/1933, 86 y.o.   MRN: [id number]  EFRAIN CLAUSON is a 86 y.o. male presenting on 12/22/2019 for Diabetes   HPI   CHRONIC DM, Type 2/ CKD-III Nephropathyand Neuropathy Partial Ray Amputation R Great Toe Last A1c 7.1 (08/2019) Due A1c today [name] doing well with sugar now much improved Working w [name] [facility] Meds:- Trulicity 0.75mg  weekly inj Saturday (has not increased yet to 1.5 due to still has med supply), [name]30u daily (down from 38 med assist), Jardiance 10mg  daily Has new True Metrix glucometer - CBG readings 144 in AM. Ranging 120-180. Reports good compliance. Tolerating well w/o side-effects CurrentlyOFFACEi(Enalapril DC'd by Cardiology) Lifestyle: - Diet (improving DM diet, drinks some water) - Exercise (limited by dyspnea) - Admits chronicDM Neuropathywith pain in feet bilateral,on Gabapentin with improvement, taking 300mg  x 2 pills twice a daybut still has neuropathy burning pain worse at night. Failed Lyrica in past. - Improved on Tramadol PRN 50-100mg  - Followed by Podiatry, he is s/p R great toe amputation partial- [facility] - they will follow up with him to treat callus Has diabetic shoes - Denies hypoglycemia, nausea vomiting, fever chills, swelling, polyuria  Urinary Incontinence Mixed See apt 10/1 telephone [name] having significant worse urinary leakage, using depends pads. Has mixed stress symptoms with straining and also spontaneous  Health Maintenance: UTD Vaccines - Flu Vaccine, COVID19 booster, PNA  Depression screen [facility] 2/9 12/22/2019 10/25/2019 09/09/2019  Decreased Interest 0 2 0  Down, Depressed, Hopeless 0 0 1  PHQ - 2 Score 0 2 1  Altered sleeping - 2 1  Tired, decreased energy - 1 1  Change in appetite - 0 2  Feeling bad or failure about yourself  - 1 0  Trouble concentrating - 0 0  Moving slowly or fidgety/restless - 0 0  Suicidal thoughts - 0 0  PHQ-9  Score - 6 5  Difficult doing work/chores - Somewhat difficult Somewhat difficult  Some recent data might be hidden    Social History   Tobacco Use  . Smoking status: Former Smoker    Packs/day: 1.75    Years: 20.00    Pack years: 35.00    Types: Cigarettes    Start date: 1955    Quit date: 1975    Years since quitting: 46.7  . Smokeless tobacco: Former Network engineer  . Vaping Use: Never used  Substance Use Topics  . Alcohol use: No    Comment: previously drank heavily - quit 1979.  . Drug use: No    Review of Systems Per HPI unless specifically indicated above     Objective:    BP 121/61   Pulse 80   Temp (!) 97.1 F (36.2 C) (Temporal)   Resp 16   Ht 5\' 4"  (1.626 m)   Wt 207 lb (93.9 kg)   SpO2 100%   BMI 35.53 kg/m   Wt Readings from Last 3 Encounters:  12/22/19 207 lb (93.9 kg)  12/01/19 205 lb 9.6 oz (93.3 kg)  11/24/19 203 lb 6 oz (92.3 kg)    Physical Exam Vitals and nursing note reviewed.  Constitutional:      General: [name] not in acute distress.    Appearance: [name] well-developed. [name] not diaphoretic.     Comments: Chronically ill-appearing but currently well and comfortable, cooperative  HENT:     Head: Normocephalic and atraumatic.  Eyes:     General:        Right eye: No discharge.        Left eye: No discharge.     Conjunctiva/sclera: Conjunctivae normal.  Cardiovascular:     Rate and Rhythm: Normal rate.  Pulmonary:     Effort: Pulmonary effort is normal.  Musculoskeletal:     Right lower leg: No edema.     Left lower leg: No edema.     Comments: Uses cane for ambulation  Skin:    General: Skin is warm and dry.     Findings: No erythema or rash.  Neurological:     Mental Status: [name] alert and oriented to person, place, and time.  Psychiatric:        Behavior: Behavior normal.     Comments: Well groomed, good eye contact, normal speech and thoughts     Recent Labs    04/13/19 1919 09/08/19 1611 12/22/19 1610    HGBA1C 7.6* 7.1* 7.4*     Results for orders placed or performed in visit on 12/22/19  POCT HgB A1C  Result Value Ref Range   Hemoglobin A1C 7.4 (A) 4.0 - 5.6 %      Assessment & Plan:   Problem List Items Addressed This Visit    Mixed stress and urge urinary incontinence   Relevant Orders   Ambulatory referral to Urology   Diabetic neuropathy associated with type 2 diabetes mellitus ([place])    Chronic bilateral diabetic neuropathy in both feet, as complication of diabetes Additionally with R toe deformity, s/p partial amputation of R Great toe Evidence of callus formation both feet, heels and mid foot Failed Lyrica previously  Plan Refill Tramadol PRN - Continue Gabapentin 600mg  BID, cannot dose higher due to CKD - Considered SNRI Duloxetine for neuropathy but would likely take weeks and ultimately we decided to avoid a daily medication  F/u as needed      Relevant Medications   traMADol (ULTRAM) 50 MG tablet   Controlled type 2 diabetes mellitus with diabetic nephropathy (HCC) - Primary    Controlled A1c 7.4, slightly increased but still at goal 7-8 lifestyle improving Complications - hypoglycemia (improved), CKD-III, peripheral neuropathy, cataracts, CAD  Plan:  1. Discussed DM management concern with CKD and risk of hypoglycemia - limited med options due to CKD - CONTINUE Trulicity 0.75mg  weekly for now until ready for dose inc to 1.5 mg - future can order higher strength through PAP  - Reduce [name] 30u to 28u daily now, continue gradual titration down if fasting CBG <150 consistently - Counseling on risk of hypoglycemia and symptoms 2. Encourage improved lifestyle - keep improving diet - limit carbs, limit sweet tea, inc water - he has poor health literacy on diabetes      Relevant Orders   POCT HgB A1C (Completed)       #Incontinence, mixed Likely mixed stress/urge incontinence Possible neurogenic component with DM Refer to Urology for further  management  Orders Placed This Encounter  Procedures  . Ambulatory referral to Urology    Referral Priority:   Routine    Referral Type:   Consultation    Referral Reason:   Specialty Services Required    Requested Specialty:   Urology    Number of Visits Requested:   1  . POCT HgB A1C    Meds ordered this encounter  Medications  . traMADol (ULTRAM) 50 MG tablet    Sig: Take 1-2 tablets (50-100 mg total)  by mouth at bedtime as needed (pain.).    Dispense:  30 tablet    Refill:  2    Follow up plan: Return in about 4 months (around 04/23/2020) for 4 month DM A1c, Foot exam, Urology may add fasting labs AFTER.   [name], [facility] 12/22/2019, 4:09 PM

## 2019-12-22 NOTE — Patient Instructions (Addendum)
Thank you for coming to the office today.  [facility] [address] -[address] floor [place],  [place]  [address] Phone: [phone]  Stay tuned for apt for urinary incontinence  Reduce [name] insulin from 30 u down to 28 unit daily for now, can gradually keep lowering if fasting sugar is < 150 on average.  Keep up with [name], she will let me know when ready for new order of Trulicity 1.5mg  dose.  Recent Labs    04/13/19 1919 09/08/19 1611 12/22/19 1610  HGBA1C 7.6* 7.1* 7.4*    Please schedule a Follow-up Appointment to: Return in about 4 months (around 04/23/2020) for 4 month DM A1c, Foot exam, Urology may add fasting labs AFTER.  If you have any other questions or concerns, please feel free to call the office or send a message through [place]. You may also schedule an earlier appointment if necessary.  Additionally, you may be receiving a survey about your experience at our office within a few days to 1 week by e-mail or mail. We value your feedback.  [name], DO [place]

## 2019-12-23 NOTE — Assessment & Plan Note (Signed)
Chronic bilateral diabetic neuropathy in both feet, as complication of diabetes Additionally with R toe deformity, s/p partial amputation of R Great toe Evidence of callus formation both feet, heels and mid foot Failed Lyrica previously  Plan Refill Tramadol PRN - Continue Gabapentin 600mg  BID, cannot dose higher due to CKD - Considered SNRI Duloxetine for neuropathy but would likely take weeks and ultimately we decided to avoid a daily medication  F/u as needed

## 2019-12-23 NOTE — Assessment & Plan Note (Signed)
Controlled A1c 7.4, slightly increased but still at goal 7-8 lifestyle improving Complications - hypoglycemia (improved), CKD-III, peripheral neuropathy, cataracts, CAD  Plan:  1. Discussed DM management concern with CKD and risk of hypoglycemia - limited med options due to CKD - CONTINUE Trulicity 0.75mg  weekly for now until ready for dose inc to 1.5 mg - future can order higher strength through PAP  - Reduce [name] 30u to 28u daily now, continue gradual titration down if fasting CBG <150 consistently - Counseling on risk of hypoglycemia and symptoms 2. Encourage improved lifestyle - keep improving diet - limit carbs, limit sweet tea, inc water - he has poor health literacy on diabetes

## 2019-12-27 ENCOUNTER — Telehealth: Payer: Self-pay

## 2019-12-27 ENCOUNTER — Telehealth: Payer: Medicare HMO

## 2019-12-27 NOTE — Telephone Encounter (Signed)
Copied from [place] [id number]. Topic: Referral - Status >> Dec 27, 2019  2:01 PM Yvette Rack wrote: Reason for CRM: Pt spouse called for an update regarding the referral for urologist. Pt spouse stated they have not heard from anyone and requests a call back

## 2020-01-04 ENCOUNTER — Encounter (HOSPITAL_COMMUNITY): Payer: Medicare HMO | Admitting: Internal Medicine

## 2020-01-05 ENCOUNTER — Ambulatory Visit (INDEPENDENT_AMBULATORY_CARE_PROVIDER_SITE_OTHER): Payer: Medicare HMO | Admitting: Internal Medicine

## 2020-01-05 ENCOUNTER — Encounter: Payer: Self-pay | Admitting: Internal Medicine

## 2020-01-05 ENCOUNTER — Other Ambulatory Visit: Payer: Self-pay

## 2020-01-05 ENCOUNTER — Ambulatory Visit (INDEPENDENT_AMBULATORY_CARE_PROVIDER_SITE_OTHER): Payer: Medicare HMO

## 2020-01-05 VITALS — BP 128/80 | HR 90 | Ht 64.0 in | Wt 205.0 lb

## 2020-01-05 DIAGNOSIS — I5022 Chronic systolic (congestive) heart failure: Secondary | ICD-10-CM | POA: Diagnosis not present

## 2020-01-05 DIAGNOSIS — N3946 Mixed incontinence: Secondary | ICD-10-CM

## 2020-01-05 DIAGNOSIS — I480 Paroxysmal atrial fibrillation: Secondary | ICD-10-CM

## 2020-01-05 DIAGNOSIS — I10 Essential (primary) hypertension: Secondary | ICD-10-CM | POA: Diagnosis not present

## 2020-01-05 DIAGNOSIS — Z7901 Long term (current) use of anticoagulants: Secondary | ICD-10-CM

## 2020-01-05 DIAGNOSIS — E785 Hyperlipidemia, unspecified: Secondary | ICD-10-CM | POA: Diagnosis not present

## 2020-01-05 DIAGNOSIS — I25118 Atherosclerotic heart disease of native coronary artery with other forms of angina pectoris: Secondary | ICD-10-CM

## 2020-01-05 LAB — POCT INR: INR: 2.1 (ref 2.0–3.0)

## 2020-01-05 NOTE — Progress Notes (Signed)
Follow-up Outpatient Visit Date: 01/05/2020  Primary Care Provider: [name] [id number]  Chief Complaint: Follow-up coronary artery disease and heart failure.  HPI:  Lucas Caldwell is a 86 y.o. male with history of coronary artery disease,chronic systolic heart failure due to likelymixed ischemic and nonischemic cardiomyopathy, hypertension, hyperlipidemia, type 2 diabetes mellitus, paroxysmal atrial fibrillation, chronic kidney disease stage III, CML, iron deficiency anemia,GI bleed due to duodenal ulcers,BPH, and hypothyroidism, who presents for follow-up of heart failure and coronary artery disease.  He was last seen in our office in early September by [name], PA, at which time he was doing fairly well from my cardiac standpoint.  He was in his sodium and fluid intake, with continued CardioMEMS monitoring through the heart failure team.  Today, Lucas Caldwell happily reports that he is feeling "100% better" in regard to his breathing.  He still has some exertional dyspnea but is able to do many activities that were too difficult for him just a few months ago.  He has not had any chest pain but continues to have an intermittent vibratory sensation in the left side of his chest.  It lasts a few seconds at a time and comes on randomly.  It is unchanged from when he wore a heart monitor earlier this year.  He has not had any significant leg swelling.  [name] concerned about instability on his feet though he has not fallen or passed out.  [name] not frankly lightheaded but feels like it is difficult for him to keep his balance and that things seem to spin around him at times.  Lucas Caldwell continues to be bothered by urinary incontinence, often times being unable to make it to the bathroom before he begins to void.  [name] scheduled for urology evaluation  tomorrow.  --------------------------------------------------------------------------------------------------  Past Medical History:  Diagnosis Date  . Arthritis    "probably in his legs" (08/15/2016)  . Blind right eye   . BPH (benign prostatic hyperplasia)   . Breast asymmetry    Left breast is larger, present for several years.  [name] CAD (coronary artery disease)    a. Cath in the late 90's - reportedly ok;  b. 2014 s/p stenting x 2 @ [facility]; c 08/19/16 Cath/PCI with DES -> RCA, plan to treat LM 70% medically. Seen by surgery and felt to be too high risk for CABG; d. 08/2018 Cath: LM 70 (iFR 0.86), LAD 20ost, 40p, 50/36m D1 70, LCX patent stent, OM1 30, RCA patent stent, 444mSR, 40d, RPDA 30. PCWP 8. CO/CI 4.0/2.1; e. 03/2019 PCI to LAD (2.5x15 Resolute Onyx DES).  . Chronic combined systolic (congestive) and diastolic (congestive) heart failure ([place]   a. Previously reduced EF-->50% by echo in 2012;  b. 06/2015 Echo: EF 50-55%  c. 07/2016 Echo: EF 45-50%; d. 12/2017 Echo: EF 55%, Gr1 DD, mild MR; e. 08/2018 Echo: EF 35-40%, mildly dil PA; f. 04/2019 Echo: EF 30-35%, glob HK, nl RV fxn; g. s/p Cardiomems.  . Chronic kidney disease (CKD), stage IV (severe) ([facility]  . CML (chronic myelocytic leukemia) ([place]  . GERD (gastroesophageal reflux disease)   . GIB (gastrointestinal bleeding)    a. 12/2017 3 unit PRBC GIB in setting of coumadin-->Endo/colon multiple duodenal ulcers and a single bleeding ulcer in the proximal ascending colon status post hemostatic clipping x2.  . Gout   . Hyperlipemia   . Hypertension   . Hypothyroidism   .  Iron deficiency anemia   . Ischemic cardiomyopathy    a. Previously reduced EF-->50% by echo in 2012;  b. 06/2015 Echo: EF 50-55%, Gr2 DD, mild MR, mildly dil LA, Ao sclerosis, mild TR;  c. 07/2016 Echo: EF 45-50%; d. 12/2017 Echo: EF 55%, Gr1 DD, mild MR; e. 08/2018 Echo: EF 35-40%; f. 04/2019 Echo: EF 30-35%.  . Migraine    "in the 1960s" (08/15/2016)  . PAF (paroxysmal  atrial fibrillation) (HCC)    a. ? Dx 2014-->s/p DCCV;  b. CHA2DS2VASc = 6--> Coumadin.  . Prostate cancer ([place])   . RBBB   . Type II diabetes mellitus ([place])    Past Surgical History:  Procedure Laterality Date  . CATARACT EXTRACTION W/ INTRAOCULAR LENS  IMPLANT, BILATERAL Bilateral   . COLONOSCOPY N/A 01/13/2018   Procedure: COLONOSCOPY;  Surgeon: Lin Landsman, MD;  [facility] ENDOSCOPY;  Service: Gastroenterology;  Laterality: N/A;  . COLONOSCOPY WITH PROPOFOL N/A 01/01/2018   Procedure: COLONOSCOPY WITH PROPOFOL;  Surgeon: Lin Landsman, MD;  Location[facility] ENDOSCOPY;  Service: Gastroenterology;  Laterality: N/A;  . CORONARY ANGIOPLASTY WITH STENT PLACEMENT  09/2012   2 stents  . CORONARY STENT INTERVENTION N/A 08/19/2016   Procedure: Coronary Stent Intervention;  Surgeon: [name], MD;  Location: [place] CV LAB;  Service: Cardiovascular;  Laterality: N/A;  . CORONARY STENT INTERVENTION N/A 04/13/2019   Procedure: CORONARY STENT INTERVENTION;  Surgeon: [name], MD;  Location: [place] CV LAB;  Service: Cardiovascular;  Laterality: N/A;  . ESOPHAGEAL MANOMETRY N/A 12/16/2017   Procedure: ESOPHAGEAL MANOMETRY (EM);  Surgeon: Lin Landsman, MD;  Location: [facility] ENDOSCOPY;  Service: Gastroenterology;  Laterality: N/A;  . ESOPHAGOGASTRODUODENOSCOPY N/A 12/20/2016   Procedure: ESOPHAGOGASTRODUODENOSCOPY (EGD);  Surgeon: Lin Landsman, MD;  Location: [facility] ENDOSCOPY;  Service: Gastroenterology;  Laterality: N/A;  . ESOPHAGOGASTRODUODENOSCOPY N/A 01/13/2018   Procedure: ESOPHAGOGASTRODUODENOSCOPY (EGD);  Surgeon: Lin Landsman, MD;  Location: [facility] ENDOSCOPY;  Service: Gastroenterology;  Laterality: N/A;  . ESOPHAGOGASTRODUODENOSCOPY (EGD) WITH PROPOFOL N/A 11/03/2017   Procedure: ESOPHAGOGASTRODUODENOSCOPY (EGD) WITH PROPOFOL;  Surgeon: Lin Landsman, MD;  Location: [facility] ENDOSCOPY;  Service: Gastroenterology;  Laterality: N/A;  . EYE  SURGERY    . HERNIA REPAIR     "navel"  . LEFT HEART CATH AND CORONARY ANGIOGRAPHY N/A 08/14/2016   Procedure: Left Heart Cath and Coronary Angiography;  Surgeon: [name], MD;  Location: [place] CV LAB;  Service: Cardiovascular;  Laterality: N/A;  . PRESSURE SENSOR/CARDIOMEMS N/A 09/23/2019   Procedure: PRESSURE SENSOR/CARDIOMEMS;  Surgeon: Jolaine Artist, MD;  Location: [place] CV LAB;  Service: Cardiovascular;  Laterality: N/A;  . RIGHT HEART CATH N/A 09/23/2019   Procedure: RIGHT HEART CATH;  Surgeon: Jolaine Artist, MD;  Location: [place] CV LAB;  Service: Cardiovascular;  Laterality: N/A;  . RIGHT/LEFT HEART CATH AND CORONARY ANGIOGRAPHY N/A 09/01/2018   Procedure: RIGHT/LEFT HEART CATH AND CORONARY ANGIOGRAPHY;  Surgeon: [name], MD;  Location: [place] CV LAB;  Service: Cardiovascular;  Laterality: N/A;  . RIGHT/LEFT HEART CATH AND CORONARY ANGIOGRAPHY N/A 04/13/2019   Procedure: RIGHT/LEFT HEART CATH AND CORONARY ANGIOGRAPHY;  Surgeon: [name], MD;  Location: [place] CV LAB;  Service: Cardiovascular;  Laterality: N/A;  . TOE AMPUTATION Right    "big toe"    Current Meds  Medication Sig  . Accu-Chek Softclix Lancets lancets Use to check blood sugar up to 2 times daily.  [name] acetaminophen (TYLENOL) 500 MG tablet Take 1,000 mg by mouth  every 6 (six) hours as needed for moderate pain or headache.   . Alcohol Swabs (B-D SINGLE USE SWABS REGULAR) PADS Use to check blood sugar up to 2 times daily  . Alcohol Swabs 70 % PADS Use when checking blood sugar up to 3x daily  . allopurinol (ZYLOPRIM) 100 MG tablet Take 1 tablet (100 mg total) by mouth daily.  [name] aspirin 81 MG chewable tablet Chew 81 mg by mouth daily.  . Blood Glucose Monitoring Suppl (ACCU-CHEK AVIVA PLUS) w/Device KIT Use to check blood sugar up to 2 times daily  . Blood Glucose Monitoring Suppl (TRUE METRIX METER) DEVI Use to check blood sugar up to 2 times daily  . bosutinib  (BOSULIF) 100 MG tablet Take 100 mg by mouth at bedtime as needed (tired/not feeling well). Take with food.   . Dulaglutide (TRULICITY) 0.34 VQ/2.5ZD SOPN Inject 0.75 mg into the skin once a week. (Patient taking differently: Inject 0.75 mg into the skin every Saturday. )  . empagliflozin (JARDIANCE) 10 MG TABS tablet Take 10 mg by mouth daily before breakfast.  . gabapentin (NEURONTIN) 300 MG capsule TAKE TWO CAPSULES BY MOUTH TWICE DAILY. (MAX DOSE = 1200MG DAILY FOR KIDNEY FUNCTION)  . hydrALAZINE (APRESOLINE) 50 MG tablet Take 50 mg by mouth 3 (three) times daily.  . Insulin Glargine ([name] KWIKPEN) 100 UNIT/ML Inject 28 Units into the skin daily.  . Insulin Pen Needle 31G X 5 MM MISC 31 g by Does not apply route as directed.  . isosorbide mononitrate (IMDUR) 60 MG 24 hr tablet Take 1 tablet (60 mg total) by mouth daily.  [name] levothyroxine (SYNTHROID) 25 MCG tablet Take 1 tablet (25 mcg total) by mouth daily before breakfast.  . losartan (COZAAR) 25 MG tablet TAKE ONE TABLET BY MOUTH ONCE DAILY  . Menthol-Zinc Oxide (GOLD BOND EX) Apply 1 application topically daily as needed (muscle pain).  . metoprolol succinate (TOPROL-XL) 25 MG 24 hr tablet TAKE ONE TABLET BY MOUTH ONCE DAILY  . Multiple Vitamin (MULTIVITAMIN WITH MINERALS) TABS tablet Take 1 tablet by mouth daily.  . nitroGLYCERIN (NITROSTAT) 0.4 MG SL tablet Place 1 tablet (0.4 mg total) under the tongue every 5 (five) minutes x 3 doses as needed for chest pain.  [name] omeprazole (PRILOSEC) 20 MG capsule Take 20 mg by mouth in the morning and at bedtime. Morning & afternoon  . omeprazole (PRILOSEC) 40 MG capsule Take by mouth.  [name] Place 1 drop into both eyes 3 (three) times daily as needed (dry/irritated eyes.).   [name] polyethylene glycol (MIRALAX / GLYCOLAX) 17 g packet Take 17 g by mouth daily as needed for mild constipation.   . potassium chloride SA (KLOR-CON) 20 MEQ tablet Take 2 tablets (40 mEq  total) by mouth daily.  [name] PROVENTIL HFA 108 (90 Base) MCG/ACT inhaler Inhale 2 puffs into the lungs every 6 (six) hours as needed for wheezing or shortness of breath.  . ranolazine (RANEXA) 500 MG 12 hr tablet Take 1 tablet (500 mg total) by mouth 2 (two) times daily.  [name] RELION TRUE METRIX TEST STRIPS test strip Use to check blood sugar up to 2 times daily  . rosuvastatin (CRESTOR) 5 MG tablet TAKE ONE TABLET BY MOUTH ONCE DAILY  . tamsulosin (FLOMAX) 0.4 MG CAPS capsule Take 1 capsule (0.4 mg total) by mouth daily after supper.  . torsemide (DEMADEX) 20 MG tablet Take 3 tablets (60 mg total) by mouth daily.  . traMADol Veatrice Bourbon)  50 MG tablet Take 1-2 tablets (50-100 mg total) by mouth at bedtime as needed (pain.).  [name] warfarin (COUMADIN) 3 MG tablet TAKE 1 TO 1 & 1/2 TABLETS ONCE A DAY AS DIRECTED BY THE COUMADIN CLINIC    Allergies: Clopidogrel, Ciprofloxacin, Mirtazapine, and Spironolactone  Social History   Tobacco Use  . Smoking status: Former Smoker    Packs/day: 1.75    Years: 20.00    Pack years: 35.00    Types: Cigarettes    Start date: 1955    Quit date: 1975    Years since quitting: 46.8  . Smokeless tobacco: Former Network engineer  . Vaping Use: Never used  Substance Use Topics  . Alcohol use: No    Comment: previously drank heavily - quit 1979.  . Drug use: No    Family History  Problem Relation Age of Onset  . Brain cancer Father   . Diabetes Sister   . Heart attack Sister     Review of Systems: A 12-system review of systems was performed and was negative except as noted in the HPI.  --------------------------------------------------------------------------------------------------  Physical Exam: BP 128/80 (BP Location: Left Arm, Patient Position: Sitting, Cuff Size: Normal)   Pulse 90   Ht _0  (1.626 m)   Wt 205 lb (93 kg)   SpO2 96%   BMI 35.19 kg/m   General: NAD.  Accompanied by his wife.  Neck: Supple without lymphadenopathy, thyromegaly,  JVD, or HJR. Lungs: Normal work of breathing. Clear to auscultation bilaterally without wheezes or crackles. Heart: Regular rate and rhythm without murmurs, rubs, or gallops. Abd: Bowel sounds present. Soft, NT/ND without hepatosplenomegaly Ext: No lower extremity edema. Skin: Warm and dry without rash.  Lab Results  Component Value Date   WBC 6.8 11/04/2019   HGB 11.9 (L) 11/04/2019   HCT 39.9 11/04/2019   MCV 89.1 11/04/2019   PLT 223 11/04/2019    Lab Results  Component Value Date   NA 139 11/24/2019   K 4.0 11/24/2019   CL 98 11/24/2019   CO2 26 11/24/2019   BUN 31 (H) 11/24/2019   CREATININE 1.65 (H) 11/24/2019   GLUCOSE 162 (H) 11/24/2019   ALT 14 08/28/2018    Lab Results  Component Value Date   CHOL 110 08/28/2018   HDL 28 (L) 08/28/2018   LDLCALC 51 08/28/2018   TRIG 155 (H) 08/28/2018   CHOLHDL 3.9 08/28/2018    --------------------------------------------------------------------------------------------------  ASSESSMENT AND PLAN: Chronic HFrEF: Lucas Caldwell reports feeling significantly better compared to a few months ago.  He appears euvolemic on examination today and continues to be followed closely by the heart failure clinic with CardioMEMS device in place.  I will defer medication changes at this time.  Coronary artery disease with stable angina: No angina reported.  Continue current medications for secondary prevention and antianginal therapy, including metoprolol, isosorbide mononitrate, and ranolazine  Paroxysmal atrial fibrillation: No clinical evidence of recurrence today.  Prior EKGs and event monitor have not shown any evidence of atrial fibrillation.  Continue warfarin as directed by the anticoagulation clinic.  Urinary incontinence: Likely multifactorial.  Given tenuous heart failure in the past, I would be reluctant to de-escalate his diuretic regimen at this time.  I agree with urology consultation.  Hyperlipidemia: LDL at goal.  Continue  low-dose rosuvastatin.  Hypertension: Diastolic blood pressure borderline but pressures otherwise reasonable.  No medication changes today.  Follow-up: Return to clinic in 3 months.  [name], MD 01/06/2020 6:54 AM

## 2020-01-05 NOTE — Patient Instructions (Signed)
-   continue warfarin of 1 TABLET EVERY DAY EXCEPT 1.5 TABLETS ON MONDAYS, [place]. - recheck INR in 6 weeks.

## 2020-01-05 NOTE — Patient Instructions (Signed)
Medication Instructions:  Your physician recommends that you continue on your current medications as directed. Please refer to the Current Medication list given to you today.  *If you need a refill on your cardiac medications before your next appointment, please call your pharmacy*  Follow-Up: At [facility], you and your health needs are our priority.  As part of our continuing mission to provide you with exceptional heart care, we have created designated Provider Care Teams.  These Care Teams include your primary Cardiologist (physician) and Advanced Practice Providers (APPs -  Physician Assistants and Nurse Practitioners) who all work together to provide you with the care you need, when you need it.  We recommend signing up for the patient portal called "MyChart".  Sign up information is provided on this After Visit Summary.  MyChart is used to connect with patients for Virtual Visits (Telemedicine).  Patients are able to view lab/test results, encounter notes, upcoming appointments, etc.  Non-urgent messages can be sent to your provider as well.   To learn more about what you can do with MyChart, go to [email or web address].com.    Your next appointment:   3 month(s)  The format for your next appointment:   In Person  Provider:   You may see Christopher End, MD or one of the following Advanced Practice Providers on your designated Care Team:    Christopher Berge, NP  Ryan Dunn, PA-C  Jacquelyn Visser, PA-C  Cadence Furth, PA-C  

## 2020-01-06 ENCOUNTER — Encounter: Payer: Self-pay | Admitting: Internal Medicine

## 2020-01-06 ENCOUNTER — Ambulatory Visit: Payer: Medicare HMO | Admitting: Urology

## 2020-01-06 ENCOUNTER — Encounter: Payer: Self-pay | Admitting: Urology

## 2020-01-06 VITALS — BP 122/72 | HR 94 | Ht 64.0 in | Wt 205.0 lb

## 2020-01-06 DIAGNOSIS — N3941 Urge incontinence: Secondary | ICD-10-CM | POA: Diagnosis not present

## 2020-01-06 DIAGNOSIS — N39498 Other specified urinary incontinence: Secondary | ICD-10-CM | POA: Diagnosis not present

## 2020-01-06 LAB — BLADDER SCAN AMB NON-IMAGING: Scan Result: 233

## 2020-01-06 NOTE — Progress Notes (Signed)
01/06/2020 2:27 PM   Lucas Caldwell [birth year] [id number]  Referring provider: [name], DO [address] [place],  [place] [address]  Chief Complaint  Patient presents with  . Urinary Incontinence    HPI: Lucas Caldwell is an 86 y.o. male seen at the request of Dr. Parks Ranger for evaluation of urinary incontinence.   69-monthhistory of urinary incontinence  Complains of onset of urge which he is unable to suppress  Denies stress incontinence  Denies dysuria, gross hematuria  No flank, abdominal or pelvic pain  No prior history prostate cancer or neurologic problems  Does have chronic constipation  Denies prior urologic evaluation or treatment of his symptoms  On tamsulosin 0.4 mg which he started approximately 1 month ago without symptom improvement  IPSS 19/35    PMH: Past Medical History:  Diagnosis Date  . Arthritis    "probably in his legs" (08/15/2016)  . Blind right eye   . BPH (benign prostatic hyperplasia)   . Breast asymmetry    Left breast is larger, present for several years.  .Marland KitchenCAD (coronary artery disease)    a. Cath in the late 90's - reportedly ok;  b. 2014 s/p stenting x 2 @ [facility]; c 08/19/16 Cath/PCI with DES -> RCA, plan to treat LM 70% medically. Seen by surgery and felt to be too high risk for CABG; d. 08/2018 Cath: LM 70 (iFR 0.86), LAD 20ost, 40p, 50/681mD1 70, LCX patent stent, OM1 30, RCA patent stent, 4036mR, 40d, RPDA 30. PCWP 8. CO/CI 4.0/2.1; e. 03/2019 PCI to LAD (2.5x15 Resolute Onyx DES).  . Chronic combined systolic (congestive) and diastolic (congestive) heart failure (HCCWeston  a. Previously reduced EF-->50% by echo in 2012;  b. 06/2015 Echo: EF 50-55%  c. 07/2016 Echo: EF 45-50%; d. 12/2017 Echo: EF 55%, Gr1 DD, mild MR; e. 08/2018 Echo: EF 35-40%, mildly dil PA; f. 04/2019 Echo: EF 30-35%, glob HK, nl RV fxn; g. s/p Cardiomems.  . Chronic kidney disease (CKD), stage IV (severe) (HCCAdena . CML (chronic myelocytic leukemia)  (HCCMorgan's Point . GERD (gastroesophageal reflux disease)   . GIB (gastrointestinal bleeding)    a. 12/2017 3 unit PRBC GIB in setting of coumadin-->Endo/colon multiple duodenal ulcers and a single bleeding ulcer in the proximal ascending colon status post hemostatic clipping x2.  . Gout   . Hyperlipemia   . Hypertension   . Hypothyroidism   . Iron deficiency anemia   . Ischemic cardiomyopathy    a. Previously reduced EF-->50% by echo in 2012;  b. 06/2015 Echo: EF 50-55%, Gr2 DD, mild MR, mildly dil LA, Ao sclerosis, mild TR;  c. 07/2016 Echo: EF 45-50%; d. 12/2017 Echo: EF 55%, Gr1 DD, mild MR; e. 08/2018 Echo: EF 35-40%; f. 04/2019 Echo: EF 30-35%.  . Migraine    "in the 1960s" (08/15/2016)  . PAF (paroxysmal atrial fibrillation) (HCC)    a. ? Dx 2014-->s/p DCCV;  b. CHA2DS2VASc = 6--> Coumadin.  . Prostate cancer (HCCJena . RBBB   . Type II diabetes mellitus (HCCMontz   Surgical History: Past Surgical History:  Procedure Laterality Date  . CATARACT EXTRACTION W/ INTRAOCULAR LENS  IMPLANT, BILATERAL Bilateral   . COLONOSCOPY N/A 01/13/2018   Procedure: COLONOSCOPY;  Surgeon: VanLin LandsmanD;  [facility];  Service: Gastroenterology;  Laterality: N/A;  . COLONOSCOPY WITH PROPOFOL N/A 01/01/2018   Procedure: COLONOSCOPY WITH PROPOFOL;  Surgeon: VanLin Landsman  MD;  Location: [facility] ENDOSCOPY;  Service: Gastroenterology;  Laterality: N/A;  . CORONARY ANGIOPLASTY WITH STENT PLACEMENT  09/2012   2 stents  . CORONARY STENT INTERVENTION N/A 08/19/2016   Procedure: Coronary Stent Intervention;  Surgeon: [name], MD;  Location: [place] [facility];  Service: Cardiovascular;  Laterality: N/A;  . CORONARY STENT INTERVENTION N/A 04/13/2019   Procedure: CORONARY STENT INTERVENTION;  Surgeon: [name], MD;  Location: [place] [facility];  Service: Cardiovascular;  Laterality: N/A;  . ESOPHAGEAL MANOMETRY N/A 12/16/2017   Procedure: ESOPHAGEAL MANOMETRY (EM);  Surgeon:  Lin Landsman, MD;  Location: [facility] ENDOSCOPY;  Service: Gastroenterology;  Laterality: N/A;  . ESOPHAGOGASTRODUODENOSCOPY N/A 12/20/2016   Procedure: ESOPHAGOGASTRODUODENOSCOPY (EGD);  Surgeon: Lin Landsman, MD;  [facility];  Service: Gastroenterology;  Laterality: N/A;  . ESOPHAGOGASTRODUODENOSCOPY N/A 01/13/2018   Procedure: ESOPHAGOGASTRODUODENOSCOPY (EGD);  Surgeon: Lin Landsman, MD;  [facility];  Service: Gastroenterology;  Laterality: N/A;  . ESOPHAGOGASTRODUODENOSCOPY (EGD) WITH PROPOFOL N/A 11/03/2017   Procedure: ESOPHAGOGASTRODUODENOSCOPY (EGD) WITH PROPOFOL;  Surgeon: Lin Landsman, MD;  Location: [facility] ENDOSCOPY;  Service: Gastroenterology;  Laterality: N/A;  . EYE SURGERY    . HERNIA REPAIR     "navel"  . LEFT HEART CATH AND CORONARY ANGIOGRAPHY N/A 08/14/2016   Procedure: Left Heart Cath and Coronary Angiography;  Surgeon: [name], MD;  Location: [place] [facility];  Service: Cardiovascular;  Laterality: N/A;  . PRESSURE SENSOR/CARDIOMEMS N/A 09/23/2019   Procedure: PRESSURE SENSOR/CARDIOMEMS;  Surgeon: Jolaine Artist, MD;  Location: [place] [facility];  Service: Cardiovascular;  Laterality: N/A;  . RIGHT HEART CATH N/A 09/23/2019   Procedure: RIGHT HEART CATH;  Surgeon: Jolaine Artist, MD;  Location: [place] [facility];  Service: Cardiovascular;  Laterality: N/A;  . RIGHT/LEFT HEART CATH AND CORONARY ANGIOGRAPHY N/A 09/01/2018   Procedure: RIGHT/LEFT HEART CATH AND CORONARY ANGIOGRAPHY;  Surgeon: [name], MD;  Location: [place] [facility];  Service: Cardiovascular;  Laterality: N/A;  . RIGHT/LEFT HEART CATH AND CORONARY ANGIOGRAPHY N/A 04/13/2019   Procedure: RIGHT/LEFT HEART CATH AND CORONARY ANGIOGRAPHY;  Surgeon: [name], MD;  Location: [place] [facility];  Service: Cardiovascular;  Laterality: N/A;  . TOE AMPUTATION Right    "big toe"    Home Medications:  Allergies as of 01/06/2020       Reactions   Clopidogrel Anaphylaxis, Other (See Comments)   altered mental status;  (electrolytes "out of wack" and confusion per family; THEY DO NOT REMEMBER ANY OTHER REACTION)- MSB 10/10/15   Ciprofloxacin Itching   Mirtazapine Diarrhea   Bad dreams   Spironolactone Other (See Comments)   gynecomastia      Medication List       Accurate as of January 06, 2020  2:27 PM. If you have any questions, ask your nurse or doctor.        Accu-Chek Aviva Plus w/Device Kit Use to check blood sugar up to 2 times daily   True Metrix Meter Devi Use to check blood sugar up to 2 times daily   Accu-Chek Softclix Lancets lancets Use to check blood sugar up to 2 times daily.   acetaminophen 500 MG tablet Commonly known as: TYLENOL Take 1,000 mg by mouth every 6 (six) hours as needed for moderate pain or headache.   Alcohol Swabs 70 % Pads Use when checking blood sugar up to 3x daily   B-D SINGLE USE SWABS REGULAR Pads Use to check blood sugar up to  2 times daily   allopurinol 100 MG tablet Commonly known as: ZYLOPRIM Take 1 tablet (100 mg total) by mouth daily.   aspirin 81 MG chewable tablet Chew 81 mg by mouth daily.   [name] KwikPen 100 UNIT/ML Inject 28 Units into the skin daily.   bosutinib 100 MG tablet Commonly known as: BOSULIF Take 100 mg by mouth at bedtime as needed (tired/not feeling well). Take with food.   Dulaglutide 0.75 MG/0.5ML Sopn Commonly known as: Trulicity Inject 8.29 mg into the skin once a week. What changed: when to take this   empagliflozin 10 MG Tabs tablet Commonly known as: JARDIANCE Take 10 mg by mouth daily before breakfast.   gabapentin 300 MG capsule Commonly known as: NEURONTIN TAKE TWO CAPSULES BY MOUTH TWICE DAILY. (MAX DOSE = 1200MG DAILY FOR KIDNEY FUNCTION)   GOLD BOND EX Apply 1 application topically daily as needed (muscle pain).   hydrALAZINE 50 MG tablet Commonly known as: APRESOLINE Take 50 mg by mouth 3 (three) times  daily.   hydrALAZINE 25 MG tablet Commonly known as: APRESOLINE Take 1 tablet (25 mg total) by mouth 3 (three) times daily.   Insulin Pen Needle 31G X 5 MM Misc 31 g by Does not apply route as directed.   isosorbide mononitrate 60 MG 24 hr tablet Commonly known as: IMDUR Take 1 tablet (60 mg total) by mouth daily.   levothyroxine 25 MCG tablet Commonly known as: SYNTHROID Take 1 tablet (25 mcg total) by mouth daily before breakfast.   losartan 25 MG tablet Commonly known as: COZAAR TAKE ONE TABLET BY MOUTH ONCE DAILY   metoprolol succinate 25 MG 24 hr tablet Commonly known as: TOPROL-XL TAKE ONE TABLET BY MOUTH ONCE DAILY   multivitamin with minerals Tabs tablet Take 1 tablet by mouth daily.   nitroGLYCERIN 0.4 MG SL tablet Commonly known as: NITROSTAT Place 1 tablet (0.4 mg total) under the tongue every 5 (five) minutes x 3 doses as needed for chest pain.   omeprazole 20 MG capsule Commonly known as: PRILOSEC Take 20 mg by mouth in the morning and at bedtime. Morning & afternoon   omeprazole 40 MG capsule Commonly known as: PRILOSEC Take by mouth.   polyethylene glycol 17 g packet Commonly known as: MIRALAX / GLYCOLAX Take 17 g by mouth daily as needed for mild constipation.   potassium chloride SA 20 MEQ tablet Commonly known as: KLOR-CON Take 2 tablets (40 mEq total) by mouth daily.   Proventil HFA 108 (90 Base) MCG/ACT inhaler Generic drug: albuterol Inhale 2 puffs into the lungs every 6 (six) hours as needed for wheezing or shortness of breath.   ranolazine 500 MG 12 hr tablet Commonly known as: RANEXA Take 1 tablet (500 mg total) by mouth 2 (two) times daily.   ReliOn True Metrix Test Strips test strip Generic drug: glucose blood Use to check blood sugar up to 2 times daily   rosuvastatin 5 MG tablet Commonly known as: CRESTOR TAKE ONE TABLET BY MOUTH ONCE DAILY   SYSTANE OP Place 1 drop into both eyes 3 (three) times daily as needed  (dry/irritated eyes.).   tamsulosin 0.4 MG Caps capsule Commonly known as: FLOMAX Take 1 capsule (0.4 mg total) by mouth daily after supper.   torsemide 20 MG tablet Commonly known as: DEMADEX Take 3 tablets (60 mg total) by mouth daily.   traMADol 50 MG tablet Commonly known as: ULTRAM Take 1-2 tablets (50-100 mg total) by mouth at bedtime as needed (  pain.).   warfarin 3 MG tablet Commonly known as: COUMADIN Take as directed by the anticoagulation clinic. If you are unsure how to take this medication, talk to your nurse or doctor. Original instructions: TAKE 1 TO 1 & 1/2 TABLETS ONCE A DAY AS DIRECTED BY THE COUMADIN CLINIC       Allergies:  Allergies  Allergen Reactions  . Clopidogrel Anaphylaxis and Other (See Comments)    altered mental status;  (electrolytes "out of wack" and confusion per family; THEY DO NOT [place])- MSB 10/10/15  . Ciprofloxacin Itching  . Mirtazapine Diarrhea    Bad dreams   . Spironolactone Other (See Comments)    gynecomastia    Family History: Family History  Problem Relation Age of Onset  . Brain cancer Father   . Diabetes Sister   . Heart attack Sister     Social History:  reports that he quit smoking about 46 years ago. His smoking use included cigarettes. He started smoking about 66 years ago. He has a 35.00 pack-year smoking history. He has quit using smokeless tobacco. He reports that he does not drink alcohol and does not use drugs.   Physical Exam: BP 122/72   Pulse 94   Ht '5\' 4"'  (1.626 m)   Wt 205 lb (93 kg)   BMI 35.19 kg/m   Constitutional:  Alert and oriented, No acute distress. HEENT: [place] AT, moist mucus membranes.  Trachea midline, no masses. Cardiovascular: No clubbing, cyanosis, or edema. Respiratory: Normal respiratory effort, no increased work of breathing. GI: Abdomen is soft, nontender, nondistended, no abdominal masses GU: Phallus without lesions, testes descended bilateral.masses or  tenderness.  Spermatic cord/epididymis palpably normal bilaterally.  Prostate 40 g, smooth without nodules Skin: No rashes, bruises or suspicious lesions. Neurologic: Grossly intact, no focal deficits, moving all 4 extremities. Psychiatric: Normal mood and affect.   Assessment & Plan:    1.  Urge incontinence  Bladder scan PVR was 233 mL  Trial Myrbetriq 25 mg daily x4 weeks  PA follow-up 1 month for symptom reassessment with repeat bladder scan  UA pending   Abbie Sons, [place] [address], [place], [place] [address] [phone]

## 2020-01-07 ENCOUNTER — Other Ambulatory Visit: Payer: Medicare HMO

## 2020-01-07 ENCOUNTER — Other Ambulatory Visit: Payer: Self-pay

## 2020-01-07 DIAGNOSIS — N39498 Other specified urinary incontinence: Secondary | ICD-10-CM

## 2020-01-08 LAB — MICROSCOPIC EXAMINATION: RBC, Urine: NONE SEEN /hpf (ref 0–2)

## 2020-01-08 LAB — URINALYSIS, COMPLETE
Bilirubin, UA: NEGATIVE
Ketones, UA: NEGATIVE
Leukocytes,UA: NEGATIVE
Nitrite, UA: NEGATIVE
Protein,UA: NEGATIVE
RBC, UA: NEGATIVE
Specific Gravity, UA: 1.015 (ref 1.005–1.030)
Urobilinogen, Ur: 0.2 mg/dL (ref 0.2–1.0)
pH, UA: 6 (ref 5.0–7.5)

## 2020-01-10 ENCOUNTER — Encounter: Payer: Self-pay | Admitting: Urology

## 2020-01-10 ENCOUNTER — Ambulatory Visit: Payer: Medicare HMO | Admitting: Pharmacist

## 2020-01-10 ENCOUNTER — Other Ambulatory Visit: Payer: Self-pay | Admitting: Family Medicine

## 2020-01-10 DIAGNOSIS — Z794 Long term (current) use of insulin: Secondary | ICD-10-CM

## 2020-01-10 DIAGNOSIS — E1121 Type 2 diabetes mellitus with diabetic nephropathy: Secondary | ICD-10-CM

## 2020-01-10 DIAGNOSIS — I10 Essential (primary) hypertension: Secondary | ICD-10-CM

## 2020-01-10 MED ORDER — TRULICITY 1.5 MG/0.5ML ~~LOC~~ SOAJ: 1.5000 mg | SUBCUTANEOUS | 3 refills | Status: DC

## 2020-01-10 NOTE — Patient Instructions (Signed)
Thank you allowing the Chronic Care Management Team to be a part of your care! It was a pleasure speaking with you today!     CCM (Chronic Care Management) Team    Noreene Larsson RN, MSN, CCM Nurse Care Coordinator  [phone]   [name] PharmD  Clinical Pharmacist  [phone]   Eula Fried LCSW Clinical Social Worker [phone]  Visit Information  Goals Addressed            This Visit's Progress    PharmD - Medication Managment       CARE PLAN ENTRY (see longitudinal plan of care for additional care plan information)  Current Barriers:   Chronic Disease Management support, education, and care coordination needs related to type 2 diabetes, CKD, HFrEF s/p CardioMEMS, hyperlipidemia, atrial fibrillation, hypertension, coronary artery disease, CHF, hypothyroidism, and CML  Financial o Note patient has Partial Extra Help/LIS Subsidy o Patient enrolled in [employer] ([employer] and [employer]) and [employer] ([place]) patient assistance programs for 2021 calendar year  Limited vision  Pharmacist Clinical Goal(s):   Over the next 30 days, patient will work with [employer] Pharmacist to address needs related to medication adherence and diabetes management  Interventions:  Perform chart review ? Note patient seen by Urologist on 10/21 regarding urinary incontinence ? Provider started patient on trial of Myrbetriq 25 mg daily x4 weeks ? Follow up appointment scheduled for 11/23  Follow up regarding start of Myrbetriq. Mrs. Fawver reports patient received Myrbetriq as sample from Urologist and has started taking as directed. ? Note Myrbetriq can raise blood pressure. Deny patient monitoring since started on Myrbetriq.  ? Encourage home blood pressure monitoring, keeping log of results and following up with providers for readings outside of established parameters   Discuss CBG control and monitoring ? Reports patient taking:  [name] 28 untis  daily  Trulicity 3.81 mg once weekly  Jardiance 10 mg once daily before breakfast o Patient reports recent AM CBGs ranging 131-158; fasting CBG today: 141 ? Encourage continuing to monitor and keep log of CBGs and bring to PCP appointments  Will collaborate with PCP regarding plan to increase patient's to Trulicity dose to 1.5 mg weekly (with dose reduction of [name] at that time)  ? Note patient not yet used up current supply of current strength, but assistance program declined to remove Trulicity Rx from automatic refill and next fill to ship ~11/17  Coordination of care -Send coordination of care message to [facility]. Note 2 different strengths of hydralazine listed on patient's current medication list in chart. Today patient's wife reports patient currently taking hydralazine 25 mg TID. From review of chart, dose of hydralazine decreased from 50 mg TID to 25mg  TID on 6/30. However, medication list in latest Cardiology office visit note from 10/20 reflects dose of hydralazine 50 mg TID.   Will collaborate with [facility] to assist patient with re-enrollment for 2022 calendar year for patient assistance for Jardiance through [employer] and for [employer] and [employer] through [employer]  Patient Self Care Activities:   Check blood sugar, blood pressure and daily weight as directed by providers   Patient takes medications, with assistance of wife, as directed o Mrs. Dibartolo manages patient's medications using weekly pillbox as adherence tool  Patient attends scheduled medical appointments  Please see past updates related to this goal by clicking on the "Past Updates" button in the selected goal         Patient verbalizes understanding of instructions provided  today.   Telephone follow up appointment with care management team member scheduled for: 11/8 at 10:45 am  [name], PharmD, [place] [phone]

## 2020-01-10 NOTE — Chronic Care Management (AMB) (Signed)
Chronic Care Management   Follow Up Note   01/10/2020 Name: Lucas Caldwell MRN: [id number] DOB: [birth year]  Referred by: Lucas Hauser, DO Reason for referral : No chief complaint on file.   Lucas Caldwell is a 86 y.o. year old male who is a primary care patient of Lucas Hauser, DO. The CCM team was consulted for assistance with chronic disease management and care coordination needs.    I reached out to Lucas Caldwell and his wife by phone today.  Review of patient status, including review of consultants reports, relevant laboratory and other test results, and collaboration with appropriate care team members and the patient's provider was performed as part of comprehensive patient evaluation and provision of chronic care management services.    SDOH (Social Determinants of Health) assessments performed: No See Care Plan activities for detailed interventions related to [facility])     Outpatient Encounter Medications as of 01/10/2020  Medication Sig  . Dulaglutide (TRULICITY) 1.09 NA/3.5TD SOPN Inject 0.75 mg into the skin once a week. (Patient taking differently: Inject 0.75 mg into the skin every Saturday. )  . empagliflozin (JARDIANCE) 10 MG TABS tablet Take 10 mg by mouth daily before breakfast.  . hydrALAZINE (APRESOLINE) 25 MG tablet Take 1 tablet (25 mg total) by mouth 3 (three) times daily.  . Insulin Glargine ([name] KWIKPEN) 100 UNIT/ML Inject 28 Units into the skin daily.  . mirabegron ER (MYRBETRIQ) 25 MG TB24 tablet Take 25 mg by mouth daily. From Urology  . omeprazole (PRILOSEC) 20 MG capsule Take 20 mg by mouth in the morning.   . Accu-Chek Softclix Lancets lancets Use to check blood sugar up to 2 times daily.  [name] acetaminophen (TYLENOL) 500 MG tablet Take 1,000 mg by mouth every 6 (six) hours as needed for moderate pain or headache.   . Alcohol Swabs (B-D SINGLE USE SWABS REGULAR) PADS Use to check blood sugar up to 2 times daily  . Alcohol  Swabs 70 % PADS Use when checking blood sugar up to 3x daily  . allopurinol (ZYLOPRIM) 100 MG tablet Take 1 tablet (100 mg total) by mouth daily.  [name] aspirin 81 MG chewable tablet Chew 81 mg by mouth daily.  . Blood Glucose Monitoring Suppl (ACCU-CHEK AVIVA PLUS) w/Device KIT Use to check blood sugar up to 2 times daily  . Blood Glucose Monitoring Suppl (TRUE METRIX METER) DEVI Use to check blood sugar up to 2 times daily  . bosutinib (BOSULIF) 100 MG tablet Take 100 mg by mouth at bedtime as needed (tired/not feeling well). Take with food.   . gabapentin (NEURONTIN) 300 MG capsule TAKE TWO CAPSULES BY MOUTH TWICE DAILY. (MAX DOSE = 1200MG DAILY FOR KIDNEY FUNCTION)  . hydrALAZINE (APRESOLINE) 50 MG tablet Take 50 mg by mouth 3 (three) times daily. (Patient not taking: Reported on 01/10/2020)  . Insulin Pen Needle 31G X 5 MM MISC 31 g by Does not apply route as directed.  . isosorbide mononitrate (IMDUR) 60 MG 24 hr tablet Take 1 tablet (60 mg total) by mouth daily.  [name] levothyroxine (SYNTHROID) 25 MCG tablet Take 1 tablet (25 mcg total) by mouth daily before breakfast.  . losartan (COZAAR) 25 MG tablet TAKE ONE TABLET BY MOUTH ONCE DAILY  . Menthol-Zinc Oxide (GOLD BOND EX) Apply 1 application topically daily as needed (muscle pain).  . metoprolol succinate (TOPROL-XL) 25 MG 24 hr tablet TAKE ONE TABLET BY MOUTH ONCE DAILY  . Multiple Vitamin (MULTIVITAMIN WITH MINERALS)  TABS tablet Take 1 tablet by mouth daily.  . nitroGLYCERIN (NITROSTAT) 0.4 MG SL tablet Place 1 tablet (0.4 mg total) under the tongue every 5 (five) minutes x 3 doses as needed for chest pain.  [name] omeprazole (PRILOSEC) 40 MG capsule Take by mouth. (Patient not taking: Reported on 01/10/2020)  . Polyethyl Glycol-Propyl Glycol (SYSTANE OP) Place 1 drop into both eyes 3 (three) times daily as needed (dry/irritated eyes.).   [name] polyethylene glycol (MIRALAX / GLYCOLAX) 17 g packet Take 17 g by mouth daily as needed for mild constipation.    . potassium chloride SA (KLOR-CON) 20 MEQ tablet Take 2 tablets (40 mEq total) by mouth daily.  [name] PROVENTIL HFA 108 (90 Base) MCG/ACT inhaler Inhale 2 puffs into the lungs every 6 (six) hours as needed for wheezing or shortness of breath.  . ranolazine (RANEXA) 500 MG 12 hr tablet Take 1 tablet (500 mg total) by mouth 2 (two) times daily.  [name] RELION TRUE METRIX TEST STRIPS test strip Use to check blood sugar up to 2 times daily  . rosuvastatin (CRESTOR) 5 MG tablet TAKE ONE TABLET BY MOUTH ONCE DAILY  . tamsulosin (FLOMAX) 0.4 MG CAPS capsule Take 1 capsule (0.4 mg total) by mouth daily after supper.  . torsemide (DEMADEX) 20 MG tablet Take 3 tablets (60 mg total) by mouth daily.  . traMADol (ULTRAM) 50 MG tablet Take 1-2 tablets (50-100 mg total) by mouth at bedtime as needed (pain.).  [name] warfarin (COUMADIN) 3 MG tablet TAKE 1 TO 1 & 1/2 TABLETS ONCE A DAY AS DIRECTED BY THE COUMADIN CLINIC   No facility-administered encounter medications on file as of 01/10/2020.    Goals Addressed            This Visit's Progress   . PharmD - Medication Managment       CARE PLAN ENTRY (see longitudinal plan of care for additional care plan information)  Current Barriers:  . Chronic Disease Management support, education, and care coordination needs related to type 2 diabetes, CKD, HFrEF s/p CardioMEMS, hyperlipidemia, atrial fibrillation, hypertension, coronary artery disease, CHF, hypothyroidism, and CML . Financial o Note patient has Partial Extra Help/LIS Subsidy o Patient enrolled in [place] [employer] health practitioner and Trulicity) and [employer]) patient assistance programs for 2021 calendar year . Limited vision  Pharmacist Clinical Goal(s):  [name] Over the next 30 days, patient will work with CM Pharmacist to address needs related to medication adherence and diabetes management  Interventions: . Perform chart review ? Note patient seen by Urologist on 10/21 regarding urinary  incontinence ? Provider started patient on trial of Myrbetriq 25 mg daily x4 weeks ? Follow up appointment scheduled for 11/23 . Follow up regarding start of Myrbetriq. Mrs. Kober reports patient received Myrbetriq as sample from Urologist and has started taking as directed. ? Note Myrbetriq can raise blood pressure. Deny patient monitoring since started on Myrbetriq.  ? Encourage home blood pressure monitoring, keeping log of results and following up with providers for readings outside of established parameters  . Discuss CBG control and monitoring ? Reports patient taking:  [name] 28 untis daily  Trulicity 8.00 mg once weekly  Jardiance 10 mg once daily before breakfast o Patient reports recent AM CBGs ranging 131-158; fasting CBG today: 141 ? Encourage continuing to monitor and keep log of CBGs and bring to PCP appointments . Will collaborate with PCP regarding plan to increase patient's to Trulicity dose to 1.5 mg weekly (with dose reduction of [name]  at that time)  ? Note patient not yet used up current supply of current strength, but assistance program declined to remove Trulicity Rx from automatic refill and next fill to ship ~11/17 . Coordination of care -Send coordination of care message to [facility]. Note 2 different strengths of hydralazine listed on patient's current medication list in chart. Today patient's wife reports patient currently taking hydralazine 25 mg TID. From review of chart, dose of hydralazine decreased from 50 mg TID to 102m TID on 6/30. However, medication list in latest Cardiology office visit note from 10/20 reflects dose of hydralazine 50 mg TID.  . [facility] to assist patient with re-enrollment for 2022 calendar year for patient assistance for Jardiance through [employer]and for [place] [employer] Patient Self Care Activities:  . Check blood sugar, blood pressure and daily weight as directed by  providers  . Patient takes medications, with assistance of wife, as directed o Mrs. Fettig manages patient's medications using weekly pillbox as adherence tool . Patient attends scheduled medical appointments  Please see past updates related to this goal by clicking on the "Past Updates" button in the selected goal         Plan  Telephone follow up appointment with care management team member scheduled for: 11/8 at 10:45 am  EHarlow Asa PharmD, [phone]

## 2020-01-11 ENCOUNTER — Other Ambulatory Visit: Payer: Self-pay | Admitting: Family Medicine

## 2020-01-11 ENCOUNTER — Other Ambulatory Visit: Payer: Self-pay | Admitting: Internal Medicine

## 2020-01-11 DIAGNOSIS — E039 Hypothyroidism, unspecified: Secondary | ICD-10-CM

## 2020-01-11 NOTE — Telephone Encounter (Signed)
Requested Prescriptions  Pending Prescriptions Disp Refills  . levothyroxine (SYNTHROID) 25 MCG tablet [Pharmacy Med Name: levothyroxine 25 mcg tablet] 90 tablet 1    Sig: TAKE ONE TABLET BY MOUTH EVERY MORNING BEFORE BREAKFAST     Endocrinology:  Hypothyroid Agents Failed - 01/11/2020 10:59 AM      Failed - TSH needs to be rechecked within 3 months after an abnormal result. Refill until TSH is due.      Passed - TSH in normal range and within 360 days    TSH  Date Value Ref Range Status  05/04/2019 3.850 0.450 - 4.500 uIU/mL Final         Passed - Valid encounter within last 12 months    Recent Outpatient Visits          2 weeks ago Controlled type 2 diabetes mellitus with diabetic nephropathy, with long-term current use of insulin ([place])   Clyman, DO   1 month ago Controlled type 2 diabetes mellitus with diabetic nephropathy, with long-term current use of insulin ([place])   Dickson, DO   4 months ago Controlled type 2 diabetes mellitus with diabetic nephropathy, with long-term current use of insulin ([place])   [facility], [name], DO   7 months ago Diabetic polyneuropathy associated with type 2 diabetes mellitus [facility])   [facility], [name], DO   11 months ago Controlled type 2 diabetes mellitus with diabetic nephropathy, with long-term current use of insulin ([place])   [place], [name], DO      Future Appointments            In 4 weeks Debroah Loop, PA-C [facility]   In 3 months Dunn, Areta Haber, PA-C [place] [place]   In 3 months [place], [name], DO [facility]

## 2020-01-11 NOTE — Telephone Encounter (Signed)
Please review for refill. Thanks!  

## 2020-01-12 DIAGNOSIS — H903 Sensorineural hearing loss, bilateral: Secondary | ICD-10-CM | POA: Diagnosis not present

## 2020-01-14 ENCOUNTER — Ambulatory Visit: Payer: Self-pay | Admitting: Pharmacist

## 2020-01-14 DIAGNOSIS — E1121 Type 2 diabetes mellitus with diabetic nephropathy: Secondary | ICD-10-CM

## 2020-01-14 DIAGNOSIS — I10 Essential (primary) hypertension: Secondary | ICD-10-CM

## 2020-01-14 DIAGNOSIS — H903 Sensorineural hearing loss, bilateral: Secondary | ICD-10-CM | POA: Diagnosis not present

## 2020-01-14 NOTE — Chronic Care Management (AMB) (Signed)
End, [name], MD  Rimmer, Eliberto Ivory, RN; [place] Cun[place] Lucas Caldwell, [facility] I would continue with hydralazine 25 mg TID. Mr. Klinke should bring all of his medication bottles with him to all doctors visits to ensure that his medication list is properly updated. Thanks.   Gerald Stabs       Previous Messages   ----- Message -----  From: Annia Belt, RN  Sent: 01/10/2020  3:20 PM EDT  To: [name], MD, Vella Raring, RPH  Subject: RE: Hydralazine Dose               Dr End can you confirm as well? I do not seeing where he is back up to the hydralazine 50 mg TID. It appears he should be taking th hydralazine 25 mg TID. Hope you're doing well!   Thanks,  Anderson Malta, RN  ----- Message -----  From: Vella Raring, [facility]  Sent: 01/10/2020 10:42 AM EDT  To: Eliberto Ivory Rimmer, RN  Subject: Hydralazine Dose                 Good morning, Anderson Malta!   I am reaching out to confirm Mr. Parkerson' current hydralazine dose. When I looked over patient's med list today, saw the two different strengths of hydralazine. [name] reports patient currently taking hydralazine 25 mg TID, which matches with recent dispensing history. From review of chart, note dose of hydralazine decreased from 50 mg TID to 25mg  TID on 6/30. However, want to check-in because when I look at the med list that was pulled in to Dr. Darnelle Bos note for 10/20, this shows the hydralazine 50 mg dose (but I do not see notes about a change).   Would you please confirm whether patient to continue on hydralazine 25 mg TID or was an adjustment to be made?   Thank you!   [name], PharmD, [place] [employer]  [phone]

## 2020-01-14 NOTE — Chronic Care Management (AMB) (Signed)
Chronic Care Management   Follow Up Note   01/14/2020 Name: Lucas Caldwell MRN: [id number] DOB: 31-Aug-1933  Referred by: [name], DO Reason for referral : Chronic Care Management (Patient Phone Call) and Care Coordination (Patient Assistance Program)   Lucas Caldwell is a 86 y.o. year old male who is a primary care patient of [name], DO. The CCM team was consulted for assistance with chronic disease management and care coordination needs.    Coordination of care call to [facility], dispensing pharmacy for [employer] patient assistance program.  Unable to reach patient/wife by phone today  Review of patient status, including review of consultants reports, relevant laboratory and other test results, and collaboration with appropriate care team members and the patient's provider was performed as part of comprehensive patient evaluation and provision of chronic care management services.    SDOH (Social Determinants of Health) assessments performed: No See Care Plan activities for detailed interventions related to [facility])     Outpatient Encounter Medications as of 01/14/2020  Medication Sig  . hydrALAZINE (APRESOLINE) 25 MG tablet Take 1 tablet (25 mg total) by mouth 3 (three) times daily.  . Accu-Chek Softclix Lancets lancets Use to check blood sugar up to 2 times daily.  [name] acetaminophen (TYLENOL) 500 MG tablet Take 1,000 mg by mouth every 6 (six) hours as needed for moderate pain or headache.   . Alcohol Swabs (B-D SINGLE USE SWABS REGULAR) PADS Use to check blood sugar up to 2 times daily  . Alcohol Swabs 70 % PADS Use when checking blood sugar up to 3x daily  . allopurinol (ZYLOPRIM) 100 MG tablet Take 1 tablet (100 mg total) by mouth daily.  [name] aspirin 81 MG chewable tablet Chew 81 mg by mouth daily.  . Blood Glucose Monitoring Suppl (ACCU-CHEK AVIVA PLUS) w/Device KIT Use to check blood sugar up to 2 times daily  . Blood Glucose Monitoring  Suppl (TRUE METRIX METER) DEVI Use to check blood sugar up to 2 times daily  . bosutinib (BOSULIF) 100 MG tablet Take 100 mg by mouth at bedtime as needed (tired/not feeling well). Take with food.   . empagliflozin (JARDIANCE) 10 MG TABS tablet Take 10 mg by mouth daily before breakfast.  . gabapentin (NEURONTIN) 300 MG capsule TAKE TWO CAPSULES BY MOUTH TWICE DAILY. (MAX DOSE = 1200MG DAILY FOR KIDNEY FUNCTION)  . Insulin Glargine ([name] KWIKPEN) 100 UNIT/ML Inject 28 Units into the skin daily.  . Insulin Pen Needle 31G X 5 MM MISC 31 g by Does not apply route as directed.  . isosorbide mononitrate (IMDUR) 60 MG 24 hr tablet Take 1 tablet (60 mg total) by mouth daily.  [name] levothyroxine (SYNTHROID) 25 MCG tablet TAKE ONE TABLET BY MOUTH EVERY MORNING BEFORE BREAKFAST  . losartan (COZAAR) 25 MG tablet TAKE ONE TABLET BY MOUTH ONCE DAILY  . Menthol-Zinc Oxide (GOLD BOND EX) Apply 1 application topically daily as needed (muscle pain).  . metoprolol succinate (TOPROL-XL) 25 MG 24 hr tablet TAKE ONE TABLET BY MOUTH ONCE DAILY  . mirabegron ER (MYRBETRIQ) 25 MG TB24 tablet Take 25 mg by mouth daily. From Urology  . Multiple Vitamin (MULTIVITAMIN WITH MINERALS) TABS tablet Take 1 tablet by mouth daily.  . nitroGLYCERIN (NITROSTAT) 0.4 MG SL tablet Place 1 tablet (0.4 mg total) under the tongue every 5 (five) minutes x 3 doses as needed for chest pain.  [name] omeprazole (PRILOSEC) 20 MG capsule Take 20 mg by mouth in the  morning.   [name] omeprazole (PRILOSEC) 40 MG capsule Take by mouth. (Patient not taking: Reported on 01/10/2020)  . Polyethyl Glycol-Propyl Glycol (SYSTANE OP) Place 1 drop into both eyes 3 (three) times daily as needed (dry/irritated eyes.).   [name] polyethylene glycol (MIRALAX / GLYCOLAX) 17 g packet Take 17 g by mouth daily as needed for mild constipation.   . potassium chloride SA (KLOR-CON) 20 MEQ tablet Take 2 tablets (40 mEq total) by mouth daily.  [name] PROVENTIL HFA 108 (90 Base) MCG/ACT  inhaler Inhale 2 puffs into the lungs every 6 (six) hours as needed for wheezing or shortness of breath.  . ranolazine (RANEXA) 500 MG 12 hr tablet Take 1 tablet (500 mg total) by mouth 2 (two) times daily.  [name] RELION TRUE METRIX TEST STRIPS test strip Use to check blood sugar up to 2 times daily  . rosuvastatin (CRESTOR) 5 MG tablet TAKE ONE TABLET BY MOUTH ONCE DAILY  . tamsulosin (FLOMAX) 0.4 MG CAPS capsule Take 1 capsule (0.4 mg total) by mouth daily after supper.  . torsemide (DEMADEX) 20 MG tablet Take 3 tablets (60 mg total) by mouth daily.  . traMADol (ULTRAM) 50 MG tablet Take 1-2 tablets (50-100 mg total) by mouth at bedtime as needed (pain.).  [name] TRULICITY 1.5 JJ/9.4RD SOPN Inject 1.5 mg into the skin once a week.  . warfarin (COUMADIN) 3 MG tablet TAKE 1 TO 1 & 1/2 TABLETS BY MOUTH ONCE DAILY AS DIRECTED BY THE COUMADIN CLINIC  . [DISCONTINUED] hydrALAZINE (APRESOLINE) 50 MG tablet Take 50 mg by mouth 3 (three) times daily. (Patient not taking: Reported on 01/10/2020)   No facility-administered encounter medications on file as of 01/14/2020.    Goals Addressed            This Visit's Progress   . PharmD - Medication Managment       CARE PLAN ENTRY (see longitudinal plan of care for additional care plan information)  Current Barriers:  . Chronic Disease Management support, education, and care coordination needs related to type 2 diabetes, CKD, HFrEF s/p CardioMEMS, hyperlipidemia, atrial fibrillation, hypertension, coronary artery disease, CHF, hypothyroidism, and CML . Financial o Note patient has Partial Extra Help/LIS Subsidy o Patient enrolled in [place] Environmental health practitioner and Trulicity) and [employer] [place]) patient assistance programs for 2021 calendar year . Limited vision  Pharmacist Clinical Goal(s):  [name] Over the next 30 days, patient will work with CM Pharmacist to address needs related to medication adherence and diabetes management  Interventions: . Sent  coordination of care message to [facility] on 10/25 for clarification regarding patient's current dose of hydralazine (see additional documentation of conversation within this encounter. ? Update patient's medication list accordingly . Collaborated with PCP regarding plan to increase patient's Trulicity dose to 1.5 mg weekly (with dose reduction of [name] at that time)  ? Note patient not yet used up current supply of current strength, but assistance program declined to remove Trulicity Rx from automatic refill and next fill to ship ~11/17 ? Provider Sent Trulicity 4.0CX weekly Rx to [facility] via e-script ? Provider agrees with plan to decrease [name] dose to 20 units daily when increase Trulicity dose  . Place coordination of care call to [facility] today. Speak with representative Karena Addison who confirms that program has received Rx from PCP for Trulicity 4.4YJ strength Rx and can ship when patient ready . Unable to reach patient/wife by phone today. Will follow up to determine current Trulicity supply/when to ship new  dose . Will continue to collaborate with [facility]PhT to assist patient with re-enrollment for 2022 calendar year for patient assistance for Jardiance through [employer] and for [place] through [employer]  Patient Self Care Activities:  . Check blood sugar, blood pressure and daily weight as directed by providers  . Patient takes medications, with assistance of wife, as directed o Mrs. Leighton manages patient's medications using weekly pillbox as adherence tool . Patient attends scheduled medical appointments  Please see past updates related to this goal by clicking on the "Past Updates" button in the selected goal         Plan  The care management team will reach out to the patient again over the next 14 days.   [name], PharmD, [place] [employer] [phone]

## 2020-01-17 ENCOUNTER — Telehealth: Payer: Medicare HMO

## 2020-01-17 NOTE — Addendum Note (Signed)
Addended by: [name] A on: 01/17/2020 12:40 PM   Modules accepted: Orders

## 2020-01-20 ENCOUNTER — Ambulatory Visit: Payer: Self-pay | Admitting: General Practice

## 2020-01-20 ENCOUNTER — Telehealth: Payer: Medicare HMO | Admitting: General Practice

## 2020-01-20 DIAGNOSIS — I480 Paroxysmal atrial fibrillation: Secondary | ICD-10-CM

## 2020-01-20 DIAGNOSIS — J449 Chronic obstructive pulmonary disease, unspecified: Secondary | ICD-10-CM

## 2020-01-20 DIAGNOSIS — R0602 Shortness of breath: Secondary | ICD-10-CM

## 2020-01-20 DIAGNOSIS — N3946 Mixed incontinence: Secondary | ICD-10-CM

## 2020-01-20 DIAGNOSIS — I25118 Atherosclerotic heart disease of native coronary artery with other forms of angina pectoris: Secondary | ICD-10-CM

## 2020-01-20 DIAGNOSIS — Z794 Long term (current) use of insulin: Secondary | ICD-10-CM

## 2020-01-20 DIAGNOSIS — E1121 Type 2 diabetes mellitus with diabetic nephropathy: Secondary | ICD-10-CM

## 2020-01-20 DIAGNOSIS — I10 Essential (primary) hypertension: Secondary | ICD-10-CM

## 2020-01-20 DIAGNOSIS — I5023 Acute on chronic systolic (congestive) heart failure: Secondary | ICD-10-CM

## 2020-01-20 NOTE — Chronic Care Management (AMB) (Signed)
Chronic Care Management   Follow Up Note   01/20/2020 Name: Lucas Caldwell MRN: [id number] DOB: [birth year]  Referred by: [name], DO Reason for referral : Chronic Care Management (RNCM Follow up for Chonic Disease Management and Care Coordination Needs )   Lucas Caldwell is a 86 y.o. year old male who is a primary care patient of [name], DO. The CCM team was consulted for assistance with chronic disease management and care coordination needs.    Review of patient status, including review of consultants reports, relevant laboratory and other test results, and collaboration with appropriate care team members and the patient's provider was performed as part of comprehensive patient evaluation and provision of chronic care management services.    SDOH (Social Determinants of Health) assessments performed: Yes See Care Plan activities for detailed interventions related to [facility])     Outpatient Encounter Medications as of 01/20/2020  Medication Sig  . Accu-Chek Softclix Lancets lancets Use to check blood sugar up to 2 times daily.  [name] acetaminophen (TYLENOL) 500 MG tablet Take 1,000 mg by mouth every 6 (six) hours as needed for moderate pain or headache.   . Alcohol Swabs (B-D SINGLE USE SWABS REGULAR) PADS Use to check blood sugar up to 2 times daily  . Alcohol Swabs 70 % PADS Use when checking blood sugar up to 3x daily  . allopurinol (ZYLOPRIM) 100 MG tablet Take 1 tablet (100 mg total) by mouth daily.  [name] aspirin 81 MG chewable tablet Chew 81 mg by mouth daily.  . Blood Glucose Monitoring Suppl (ACCU-CHEK AVIVA PLUS) w/Device KIT Use to check blood sugar up to 2 times daily  . Blood Glucose Monitoring Suppl (TRUE METRIX METER) DEVI Use to check blood sugar up to 2 times daily  . bosutinib (BOSULIF) 100 MG tablet Take 100 mg by mouth at bedtime as needed (tired/not feeling well). Take with food.   . empagliflozin (JARDIANCE) 10 MG TABS tablet Take 10 mg  by mouth daily before breakfast.  . gabapentin (NEURONTIN) 300 MG capsule TAKE TWO CAPSULES BY MOUTH TWICE DAILY. (MAX DOSE = 1200MG DAILY FOR KIDNEY FUNCTION)  . hydrALAZINE (APRESOLINE) 25 MG tablet Take 1 tablet (25 mg total) by mouth 3 (three) times daily.  . Insulin Glargine ([name] KWIKPEN) 100 UNIT/ML Inject 28 Units into the skin daily.  . Insulin Pen Needle 31G X 5 MM MISC 31 g by Does not apply route as directed.  . isosorbide mononitrate (IMDUR) 60 MG 24 hr tablet Take 1 tablet (60 mg total) by mouth daily.  [name] levothyroxine (SYNTHROID) 25 MCG tablet TAKE ONE TABLET BY MOUTH EVERY MORNING BEFORE BREAKFAST  . losartan (COZAAR) 25 MG tablet TAKE ONE TABLET BY MOUTH ONCE DAILY  . Menthol-Zinc Oxide (GOLD BOND EX) Apply 1 application topically daily as needed (muscle pain).  . metoprolol succinate (TOPROL-XL) 25 MG 24 hr tablet TAKE ONE TABLET BY MOUTH ONCE DAILY  . mirabegron ER (MYRBETRIQ) 25 MG TB24 tablet Take 25 mg by mouth daily. From Urology  . Multiple Vitamin (MULTIVITAMIN WITH MINERALS) TABS tablet Take 1 tablet by mouth daily.  . nitroGLYCERIN (NITROSTAT) 0.4 MG SL tablet Place 1 tablet (0.4 mg total) under the tongue every 5 (five) minutes x 3 doses as needed for chest pain.  [name] omeprazole (PRILOSEC) 20 MG capsule Take 20 mg by mouth in the morning.   [name] Place 1 drop into both eyes 3 (three) times daily as needed (  dry/irritated eyes.).   [name] polyethylene glycol (MIRALAX / GLYCOLAX) 17 g packet Take 17 g by mouth daily as needed for mild constipation.   . potassium chloride SA (KLOR-CON) 20 MEQ tablet Take 2 tablets (40 mEq total) by mouth daily.  [name] PROVENTIL HFA 108 (90 Base) MCG/ACT inhaler Inhale 2 puffs into the lungs every 6 (six) hours as needed for wheezing or shortness of breath.  . ranolazine (RANEXA) 500 MG 12 hr tablet Take 1 tablet (500 mg total) by mouth 2 (two) times daily.  [name] RELION TRUE METRIX TEST STRIPS test strip Use to  check blood sugar up to 2 times daily  . rosuvastatin (CRESTOR) 5 MG tablet TAKE ONE TABLET BY MOUTH ONCE DAILY  . tamsulosin (FLOMAX) 0.4 MG CAPS capsule Take 1 capsule (0.4 mg total) by mouth daily after supper.  . torsemide (DEMADEX) 20 MG tablet Take 3 tablets (60 mg total) by mouth daily.  . traMADol (ULTRAM) 50 MG tablet Take 1-2 tablets (50-100 mg total) by mouth at bedtime as needed (pain.).  [name] TRULICITY 1.5 UP/1.0RP SOPN Inject 1.5 mg into the skin once a week.  . warfarin (COUMADIN) 3 MG tablet TAKE 1 TO 1 & 1/2 TABLETS BY MOUTH ONCE DAILY AS DIRECTED BY THE COUMADIN CLINIC   No facility-administered encounter medications on file as of 01/20/2020.     Objective:  BP Readings from Last 3 Encounters:  01/06/20 122/72  01/05/20 128/80  12/22/19 121/61    Goals Addressed              This Visit's Progress   .  RN- I am getting more short winded but I feel better now (pt-stated)        Current Barriers:  [name] Knowledge deficit related to basic heart failure pathophysiology and self care management   Case Manager Clinical Goal(s):  [name] Over the next 120 days, patient will verbalize understanding of Heart Failure Action Plan and when to call doctor . Over the next 120 days, patient will take all Heart Failure mediations as prescribed . Over the next 120 days, patient will weigh daily and record (notifying MD of 3 lb weight gain over night or 5 lb in a week) . Over the next 120 days, patient will take blood pressure daily and record, notifying the provider for blood pressure outside of set parameters  Interventions:  . Basic overview and discussion of pathophysiology of Heart Failure reviewed  . Provided verbal education on low sodium diet, per the patient wife he is doing better with dietary restrictions. The patients wife continues to monitor and help patient  . Discussed importance of daily weight and advised patient to weigh and record daily: patient is now weighing daily and  recording, weight on 8-4 was 203.  The patients wife was not at home and the patient states that she keeps up with all of that "stuff" for him. 12-16-2019: the patient has a "pillow" that he gets on each day and it measures his weight and sends it the the cardiologist.  If he has extra fluid on board it alerts the provider and they call the patient and give instructions. The patients wife states he does this every day and they have not called but they do get the readings. It is a new thing they are doing and it is working well for him.  [name] taking "3" fluid pills a day. The wife helps the patient keep up with his medications, appointments, and daily routines.  01-20-2020: The patient continues to use the pillow daily and his weight is staying 202 to 205 range. They call if the weight is out of range. The patients wife states that they have not received a call. She called to makes sure it was working right. The patient wife states they told her everything was fine and that if it was not accurate they would receive a call. Education and support given.  . Reviewed role of diuretics in prevention of fluid overload and management of heart failure.  His diuretic regimen is working well for him at this time. 01-20-2020: Review education and support.   . Discussed s/s of HF and increased fluid. Patient verbalized that he is feeling much better and went today to have a "test".  (ECHO- 04/22/2019 with an EF% of 30-35%) 01-20-2020: The patient is stable at this time. His wife verbalized he will follow up with the cardiologist next month. No changes in plan of care at this time. Has been working with the CCM pharmacist to understand medications and remain compliant. . Evaluation of oxygen needs: Patient reports using 02 at night successfully. The patient expressed he usually wakes up in the middle of the night and the oxygen has dried his nose out. He usually takes it off then and goes back to sleep without incident.  The patient  does use oxygen during the day if he needs it. No acute distress noted. . Discussed the importance of [place] and the patient states PT and a nurse are coming into the home to check on him regularly. The patient feels he is improving. [name] ejection fraction: 30-35% 04-22-2019   Wt Readings from Last 3 Encounters:  01/06/20 205 lb (93 kg)  01/05/20 205 lb (93 kg)  12/22/19 207 lb (93.9 kg)     BP Readings from Last 3 Encounters:  01/06/20 122/72  01/05/20 128/80  12/22/19 121/61             Patient Self Care Activities:  . Takes Heart Failure Medications as prescribed . Weighs daily and record (notifying MD of 3 lb weight gain over night or 5 lb in a week) . Verbalizes understanding of and follows CHF Action Plan . Adheres to low sodium diet  Please see past updates related to this goal by clicking on the "Past Updates" button in the selected goal      .  RNCM: Pt's wife: "[name] still going to the bathroom alot"        [place] (see longitudinal plan of care for additional care plan information)  Current Barriers:  [name] Knowledge Deficits related to urinary incontinence and frequency . Chronic Disease Management support and education needs related to urinary incontinence and frequency . Lacks caregiver support.   Nurse Case Manager Clinical Goal(s):  [name] Over the next 120 days, patient will verbalize understanding of plan for urinary incontinence and frequency . Over the next 120 days, patient will work with [facility] [place] team, pcp and specialist  to address needs related to urinary changes . Over the next 120 days, patient will demonstrate a decrease in urinary incontinence and frequency exacerbations as evidenced by plan of care to help with urinary needs . Over the next 120 days, patient will attend all scheduled medical appointments: specialist on 02/08/2020, pcp on 04-24-2020 . Over the next 120 days, patient will verbalize basic understanding of urinary/kidney  disease process and self  health management plan as evidenced by plan of care to help the  patient with urinary incontinence and frequency  Interventions:  . Inter-disciplinary care team collaboration (see longitudinal plan of care) . Evaluation of current treatment plan related to urinary incontinence and frequency and patient's adherence to plan as established by provider. . Advised patient to talk to specialist about changes in urinary habits.  The patient has been taking myrbetiq and the patient feels this has helped but the wife states he is still going to the bathroom a lot.  . Provided education to patient re: Writing down questions to ask the provider at his next appointment on 02-08-2020.   [name] Reviewed medications with patient and discussed Myrbetiq and compliance. The patient has a supply of this medication to take for 30 days.  Nash Dimmer with CCM pharmacist  regarding Myrbetiq, recent follow up with the patient and the patients wife by the pharmacist.  . Discussed plans with patient for ongoing care management follow up and provided patient with direct contact information for care management team . Reviewed scheduled/upcoming provider appointments including: Specialist on 02-08-2020 and pcp on 04-24-2020  Patient Self Care Activities:  . Patient verbalizes understanding of plan to work with the CCM team, pcp, and specialist in managing urinary incontinence and frequency.  . Self administers medications as prescribed . Attends all scheduled provider appointments . Calls provider office for new concerns or questions . Unable to independently manage urinary incontinence and frequency   Initial goal documentation     .  RNCM: We doing better at watching his blood pressure and sugars        Current Barriers:  . Chronic Disease Management support, education, and care coordination needs related to HTN, COPD, and DMII  Clinical Goal(s) related to  HTN, COPD, and DMII:  Over the next 120 days, patient will:   . Work with the care management team to address educational, disease management, and care coordination needs  . Begin or continue self health monitoring activities as directed today Measure and record cbg (blood glucose) 1 times daily, Measure and record blood pressure 5 times per week, Measure and record weight daily, and adhere to a heart healthy/ADA diet . Call provider office for new or worsened signs and symptoms Blood glucose findings outside established parameters, Blood pressure findings outside established parameters, Weight outside established parameters, Oxygen saturation lower than established parameter, Shortness of breath, and New or worsened symptom related to Chronic health conditions . Call care management team with questions or concerns . Verbalize basic understanding of patient centered plan of care established today  Interventions related to  HTN, COPD, and DMII: . Evaluation of current treatment plans and patient's adherence to plan as established by provider.  01-20-2020: the patient states he feels he is doing very well right now with his chronic health conditions. Denies any acute distress. Sees providers regularly. The patient likes for the [facility] to talk to his wife. His wife states he is doing well.  . Assessed patient understanding of disease states- the patient verbalized he does well and his wife keeps up with all the things he needs. 12-16-2019: The patient's wife states the patient is stable at this time. Denies any new concerns at this time. 01-20-2020: The patient  has a new concern with urinary incontinence and frequency.  See new goal for this.  . Assessed patient's education and care coordination needs.  Ongoing support and education on maintaining a diet low in sodium and sugars. 12-16-2019: The patient is still waiting on his new glucose meter from [facility].  The patients wife said she thought it would be here by now. Advised the wife to call [facility] and inquire about  shipment. The wife states that she has a tracking number. She also states that he sill is using his other meter but needs a battery for it. Review of watching for hypoglycemia. 01-20-2020: The patients blood sugars are ranging 145 to 156, this am it was 150.  The patient doesn't have the best appetite but the patient is eating and staying hydrated.  Denies any episodes of hypoglycemia.   . Provided disease specific education to patient. 01-20-2020: Reminded the patient of the CCM team available to assist as needed and to continue to follow up with providers on a regular basis. Next appointment with the pcp on 04-24-2020 . Collaborated with appropriate clinical care team members regarding patient needs: CCM pharmacist available and working with the patient also.  . Evaluation of sx/sx of shortness of breath. The patient denies shortness of breath at this times. Does admit he has some when he is active. Feels he has improved overall. 12-16-2019: The patient saw his cancer doctor yesterday and got a good report. The patient also received his COVID 19 booster shot. Vaccination record updated accordingly. The patients wife states he is doing fine and denies any side effects from the booster. 01-20-2020: The patient is stable with his COPD.  The wife states he still gets "short winded" but it is not worse than usual. The patients wife knows to call the provider for changes.   Patient Self Care Activities related to HTN, DMII, and COPD . Patient is unable to independently self-manage chronic health conditions  Please see past updates related to this goal by clicking on the "Past Updates" button in the selected goal          Plan:   Telephone follow up appointment with care management team member scheduled for: 03-16-2020 at [place] am   [place], MSN, [place] Mobile: [phone]

## 2020-01-20 NOTE — Patient Instructions (Signed)
Visit Information  Goals Addressed              This Visit's Progress     RN- I am getting more short winded but I feel better now (pt-stated)        Current Barriers:   Knowledge deficit related to basic heart failure pathophysiology and self care management   Case Manager Clinical Goal(s):   Over the next 120 days, patient will verbalize understanding of Heart Failure Action Plan and when to call doctor  Over the next 120 days, patient will take all Heart Failure mediations as prescribed  Over the next 120 days, patient will weigh daily and record (notifying MD of 3 lb weight gain over night or 5 lb in a week)  Over the next 120 days, patient will take blood pressure daily and record, notifying the provider for blood pressure outside of set parameters  Interventions:   Basic overview and discussion of pathophysiology of Heart Failure reviewed   Provided verbal education on low sodium diet, per the patient wife he is doing better with dietary restrictions. The patients wife continues to monitor and help patient   Discussed importance of daily weight and advised patient to weigh and record daily: patient is now weighing daily and recording, weight on 8-4 was 203.  The patients wife was not at home and the patient states that she keeps up with all of that "stuff" for him. 12-16-2019: the patient has a "pillow" that he gets on each day and it measures his weight and sends it the the cardiologist.  If he has extra fluid on board it alerts the provider and they call the patient and give instructions. The patients wife states he does this every day and they have not called but they do get the readings. It is a new thing they are doing and it is working well for him.  [name] taking "3" fluid pills a day. The wife helps the patient keep up with his medications, appointments, and daily routines. 01-20-2020: The patient continues to use the pillow daily and his weight is staying 202 to 205 range.  They call if the weight is out of range. The patients wife states that they have not received a call. She called to makes sure it was working right. The patient wife states they told her everything was fine and that if it was not accurate they would receive a call. Education and support given.   Reviewed role of diuretics in prevention of fluid overload and management of heart failure.  His diuretic regimen is working well for him at this time. 01-20-2020: Review education and support.    Discussed s/s of HF and increased fluid. Patient verbalized that he is feeling much better and went today to have a "test".  (ECHO- 04/22/2019 with an EF% of 30-35%) 01-20-2020: The patient is stable at this time. His wife verbalized he will follow up with the cardiologist next month. No changes in plan of care at this time. Has been working with the CCM pharmacist to understand medications and remain compliant.  Evaluation of oxygen needs: Patient reports using 02 at night successfully. The patient expressed he usually wakes up in the middle of the night and the oxygen has dried his nose out. He usually takes it off then and goes back to sleep without incident.  The patient does use oxygen during the day if he needs it. No acute distress noted.  Discussed the importance of [place] and  the patient states PT and a nurse are coming into the home to check on him regularly. The patient feels he is improving.  ejection fraction: 30-35% 04-22-2019   Wt Readings from Last 3 Encounters:  01/06/20 205 lb (93 kg)  01/05/20 205 lb (93 kg)  12/22/19 207 lb (93.9 kg)     BP Readings from Last 3 Encounters:  01/06/20 122/72  01/05/20 128/80  12/22/19 121/61             Patient Self Care Activities:   Takes Heart Failure Medications as prescribed  Weighs daily and record (notifying MD of 3 lb weight gain over night or 5 lb in a week)  Verbalizes understanding of and follows CHF Action Plan  Adheres to low sodium  diet  Please see past updates related to this goal by clicking on the "Past Updates" button in the selected goal        RNCM: Pt's wife: "[name] still going to the bathroom alot"        [place] (see longitudinal plan of care for additional care plan information)  Current Barriers:   Knowledge Deficits related to urinary incontinence and frequency  Chronic Disease Management support and education needs related to urinary incontinence and frequency  Lacks caregiver support.   Nurse Case Manager Clinical Goal(s):   Over the next 120 days, patient will verbalize understanding of plan for urinary incontinence and frequency  Over the next 120 days, patient will work with [facility], CCM team, pcp and specialist  to address needs related to urinary changes  Over the next 120 days, patient will demonstrate a decrease in urinary incontinence and frequency exacerbations as evidenced by plan of care to help with urinary needs  Over the next 120 days, patient will attend all scheduled medical appointments: specialist on 02/08/2020, pcp on 04-24-2020  Over the next 120 days, patient will verbalize basic understanding of urinary/kidney  disease process and self health management plan as evidenced by plan of care to help the patient with urinary incontinence and frequency  Interventions:   Inter-disciplinary care team collaboration (see longitudinal plan of care)  Evaluation of current treatment plan related to urinary incontinence and frequency and patient's adherence to plan as established by provider.  Advised patient to talk to specialist about changes in urinary habits.  The patient has been taking myrbetiq and the patient feels this has helped but the wife states he is still going to the bathroom a lot.   Provided education to patient re: Writing down questions to ask the provider at his next appointment on 02-08-2020.    Reviewed medications with patient and discussed Myrbetiq and  compliance. The patient has a supply of this medication to take for 30 days.   Collaborated with CCM pharmacist  regarding Myrbetiq, recent follow up with the patient and the patients wife by the pharmacist.   Discussed plans with patient for ongoing care management follow up and provided patient with direct contact information for care management team  Reviewed scheduled/upcoming provider appointments including: Specialist on 02-08-2020 and pcp on 04-24-2020  Patient Self Care Activities:   Patient verbalizes understanding of plan to work with the CCM team, pcp, and specialist in managing urinary incontinence and frequency.   Self administers medications as prescribed  Attends all scheduled provider appointments  Calls provider office for new concerns or questions  Unable to independently manage urinary incontinence and frequency   Initial goal documentation  RNCM: We doing better at watching his blood pressure and sugars        Current Barriers:   Chronic Disease Management support, education, and care coordination needs related to HTN, COPD, and DMII  Clinical Goal(s) related to  HTN, COPD, and DMII:  Over the next 120 days, patient will:   Work with the care management team to address educational, disease management, and care coordination needs   Begin or continue self health monitoring activities as directed today Measure and record cbg (blood glucose) 1 times daily, Measure and record blood pressure 5 times per week, Measure and record weight daily, and adhere to a heart healthy/ADA diet  Call provider office for new or worsened signs and symptoms Blood glucose findings outside established parameters, Blood pressure findings outside established parameters, Weight outside established parameters, Oxygen saturation lower than established parameter, Shortness of breath, and New or worsened symptom related to Chronic health conditions  Call care management team with questions  or concerns  Verbalize basic understanding of patient centered plan of care established today  Interventions related to  HTN, COPD, and DMII:  Evaluation of current treatment plans and patient's adherence to plan as established by provider.  01-20-2020: the patient states he feels he is doing very well right now with his chronic health conditions. Denies any acute distress. Sees providers regularly. The patient likes for the [facility] to talk to his wife. His wife states he is doing well.   Assessed patient understanding of disease states- the patient verbalized he does well and his wife keeps up with all the things he needs. 12-16-2019: The patient's wife states the patient is stable at this time. Denies any new concerns at this time. 01-20-2020: The patient  has a new concern with urinary incontinence and frequency.  See new goal for this.   Assessed patient's education and care coordination needs.  Ongoing support and education on maintaining a diet low in sodium and sugars. 12-16-2019: The patient is still waiting on his new glucose meter from [facility]. The patients wife said she thought it would be here by now. Advised the wife to call [facility] and inquire about shipment. The wife states that she has a tracking number. She also states that he sill is using his other meter but needs a battery for it. Review of watching for hypoglycemia. 01-20-2020: The patients blood sugars are ranging 145 to 156, this am it was 150.  The patient doesn't have the best appetite but the patient is eating and staying hydrated.  Denies any episodes of hypoglycemia.    Provided disease specific education to patient. 01-20-2020: Reminded the patient of the CCM team available to assist as needed and to continue to follow up with providers on a regular basis. Next appointment with the pcp on 04-24-2020  Collaborated with appropriate clinical care team members regarding patient needs: CCM pharmacist available and working with the patient  also.   Evaluation of sx/sx of shortness of breath. The patient denies shortness of breath at this times. Does admit he has some when he is active. Feels he has improved overall. 12-16-2019: The patient saw his cancer doctor yesterday and got a good report. The patient also received his COVID 19 booster shot. Vaccination record updated accordingly. The patients wife states he is doing fine and denies any side effects from the booster. 01-20-2020: The patient is stable with his COPD.  The wife states he still gets "short winded" but it is not worse than usual. The  patients wife knows to call the provider for changes.   Patient Self Care Activities related to HTN, DMII, and COPD  Patient is unable to independently self-manage chronic health conditions  Please see past updates related to this goal by clicking on the "Past Updates" button in the selected goal         Patient verbalizes understanding of instructions provided today.   Telephone follow up appointment with care management team member scheduled for: 03-16-2020 0945 am  [place], MSN, [place] Mobile: [phone]

## 2020-01-24 ENCOUNTER — Ambulatory Visit: Payer: Medicare HMO | Admitting: Pharmacist

## 2020-01-24 DIAGNOSIS — E1121 Type 2 diabetes mellitus with diabetic nephropathy: Secondary | ICD-10-CM

## 2020-01-24 NOTE — Patient Instructions (Signed)
Thank you allowing the Chronic Care Management Team to be a part of your care! It was a pleasure speaking with you today!     CCM (Chronic Care Management) Team    Noreene Larsson RN, MSN, CCM Nurse Care Coordinator  [phone]   [name] PharmD  Clinical Pharmacist  [phone]   Eula Fried LCSW Clinical Social Worker [phone]  Visit Information  Goals Addressed            This Visit's Progress    PharmD - Medication Managment       CARE PLAN ENTRY (see longitudinal plan of care for additional care plan information)  Current Barriers:   Chronic Disease Management support, education, and care coordination needs related to type 2 diabetes, CKD, HFrEF s/p CardioMEMS, hyperlipidemia, atrial fibrillation, hypertension, coronary artery disease, CHF, hypothyroidism, and CML  Financial o Note patient has Partial Extra Help/LIS Subsidy o Patient enrolled in [place] ([employer], agricultural and Trulicity) and [employer] [place]) patient assistance programs for 2021 calendar year  Limited vision  Pharmacist Clinical Goal(s):   Over the next 30 days, patient will work with [employer] Pharmacist to address needs related to medication adherence and diabetes management  Interventions:  Collaborated with PCP via [employer] on 10/25 regarding plan to increase patient's Trulicity dose to 1.5 mg weekly (with dose reduction of [name] at that time)  ? Provider Sent Trulicity 1.5mg  weekly Rx to [facility] via e-script ? Provider agrees with plan to decrease [name] dose to 20 units daily with Trulicity dose increase  Follow up with patient's wife today regarding patient's diabetes medication management and monitoring ? Reports patient taking: ? [name] 28 untis daily ? Trulicity 2.99 mg once weekly on Saturdays (last dose on 11/6) ? Jardiance 10 mg once daily before breakfast ? Denies missed doses ? Patient reports recent AM CBGs readings  AM Blood Sugar Notes  1  - November 156   2 - November 145   3 - November 158   4 - November 150   5 - November 189* *Wife attributes higher readings to patient having fried foods on evenings prior to these readings  6 - November 171*   7 - November 170*   8 - November 181*     ? Wife notes patient has had higher morning CBGs past 4 mornings. Attributes this to patient eating fried foods in evenings recently. ? Discuss importance of well-balanced meals and limiting fried foods as well as portion sizes of other carbohydrates ? Mrs. Sar reports picked up supply from patient assistance program of new strength of Trulicity 1.5 mg from office today and reports patient/wife interested in starting new dose ? Counsel wife/caregiver for patient to START Trulicity 1.5 mg weekly next Saturday, 11/13, and to decrease [name] dose at that time to [place] 20 units daily per PCP. ? Mrs. Dietrick writes down these directions and verbalizes understanding via teachback method ? Wife reports will discard remaining supply of Trulicity 2.42 pens  Continue to collaborate with [facility] CPh[facility] to assist patient with re-enrollment for 2022 calendar year for patient assistance for Jardiance through [employer] and for [place] through [employer] ? Mrs. Payson confirms received applications in mail last week and plans to follow up with [facility] CPhT as needed for questions  Patient Self Care Activities:   Check blood sugar, blood pressure and daily weight as directed by providers   Patient takes medications, with assistance of wife, as directed o Mrs. [employer]  patient's medications using weekly pillbox as adherence tool  Patient attends scheduled medical appointments  Please see past updates related to this goal by clicking on the "Past Updates" button in the selected goal         Patient verbalizes understanding of instructions provided today.   Telephone follow up appointment with care management team  member scheduled for: 11/19 at 10:30 am  [name], PharmD, [place] [phone]

## 2020-01-24 NOTE — Chronic Care Management (AMB) (Signed)
Chronic Care Management   Follow Up Note   01/24/2020 Name: Lucas Caldwell MRN: [id number] DOB: Nov 20, 1933  Referred by: Lucas Hauser, DO Reason for referral : Chronic Care Management (Patient/caregiver Phone Call)   Lucas Caldwell is a 86 y.o. year old male who is a primary care patient of Lucas Hauser, DO. The CCM team was consulted for assistance with chronic disease management and care coordination needs.    I reached out to Lucas Caldwell and his wife by phone today. Speak with patient's wife (listed on DPR) today.  Review of patient status, including review of consultants reports, relevant laboratory and other test results, and collaboration with appropriate care team members and the patient's provider was performed as part of comprehensive patient evaluation and provision of chronic care management services.    SDOH (Social Determinants of Health) assessments performed: No See Care Plan activities for detailed interventions related to [name])     Outpatient Encounter Medications as of 01/24/2020  Medication Sig Note  . empagliflozin (JARDIANCE) 10 MG TABS tablet Take 10 mg by mouth daily before breakfast.   . Insulin Glargine ([name] KWIKPEN) 100 UNIT/ML Inject 28 Units into the skin daily.   . Accu-Chek Softclix Lancets lancets Use to check blood sugar up to 2 times daily.   [name] acetaminophen (TYLENOL) 500 MG tablet Take 1,000 mg by mouth every 6 (six) hours as needed for moderate pain or headache.    . Alcohol Swabs (B-D SINGLE USE SWABS REGULAR) PADS Use to check blood sugar up to 2 times daily   . Alcohol Swabs 70 % PADS Use when checking blood sugar up to 3x daily   . allopurinol (ZYLOPRIM) 100 MG tablet Take 1 tablet (100 mg total) by mouth daily.   [name] aspirin 81 MG chewable tablet Chew 81 mg by mouth daily.   . Blood Glucose Monitoring Suppl (ACCU-CHEK AVIVA PLUS) w/Device KIT Use to check blood sugar up to 2 times daily   . Blood Glucose  Monitoring Suppl (TRUE METRIX METER) DEVI Use to check blood sugar up to 2 times daily   . bosutinib (BOSULIF) 100 MG tablet Take 100 mg by mouth at bedtime as needed (tired/not feeling well). Take with food.    . gabapentin (NEURONTIN) 300 MG capsule TAKE TWO CAPSULES BY MOUTH TWICE DAILY. (MAX DOSE = 1200MG DAILY FOR KIDNEY FUNCTION)   . hydrALAZINE (APRESOLINE) 25 MG tablet Take 1 tablet (25 mg total) by mouth 3 (three) times daily.   . Insulin Pen Needle 31G X 5 MM MISC 31 g by Does not apply route as directed.   . isosorbide mononitrate (IMDUR) 60 MG 24 hr tablet Take 1 tablet (60 mg total) by mouth daily.   [name] levothyroxine (SYNTHROID) 25 MCG tablet TAKE ONE TABLET BY MOUTH EVERY MORNING BEFORE BREAKFAST   . losartan (COZAAR) 25 MG tablet TAKE ONE TABLET BY MOUTH ONCE DAILY   . Menthol-Zinc Oxide (GOLD BOND EX) Apply 1 application topically daily as needed (muscle pain).   . metoprolol succinate (TOPROL-XL) 25 MG 24 hr tablet TAKE ONE TABLET BY MOUTH ONCE DAILY   . mirabegron ER (MYRBETRIQ) 25 MG TB24 tablet Take 25 mg by mouth daily. From Urology   . Multiple Vitamin (MULTIVITAMIN WITH MINERALS) TABS tablet Take 1 tablet by mouth daily.   . nitroGLYCERIN (NITROSTAT) 0.4 MG SL tablet Place 1 tablet (0.4 mg total) under the tongue every 5 (five) minutes x 3 doses as needed for chest pain.   [name]  omeprazole (PRILOSEC) 20 MG capsule Take 20 mg by mouth in the morning.    Lucas Caldwell Glycol-Propyl Glycol (SYSTANE OP) Place 1 drop into both eyes 3 (three) times daily as needed (dry/irritated eyes.).    [name] polyethylene glycol (MIRALAX / GLYCOLAX) 17 g packet Take 17 g by mouth daily as needed for mild constipation.    . potassium chloride SA (KLOR-CON) 20 MEQ tablet Take 2 tablets (40 mEq total) by mouth daily.   [name] PROVENTIL HFA 108 (90 Base) MCG/ACT inhaler Inhale 2 puffs into the lungs every 6 (six) hours as needed for wheezing or shortness of breath.   . ranolazine (RANEXA) 500 MG 12 hr tablet Take 1  tablet (500 mg total) by mouth 2 (two) times daily.   [name] RELION TRUE METRIX TEST STRIPS test strip Use to check blood sugar up to 2 times daily   . rosuvastatin (CRESTOR) 5 MG tablet TAKE ONE TABLET BY MOUTH ONCE DAILY   . tamsulosin (FLOMAX) 0.4 MG CAPS capsule Take 1 capsule (0.4 mg total) by mouth daily after supper.   . torsemide (DEMADEX) 20 MG tablet Take 3 tablets (60 mg total) by mouth daily.   . traMADol (ULTRAM) 50 MG tablet Take 1-2 tablets (50-100 mg total) by mouth at bedtime as needed (pain.).   [name] TRULICITY 1.5 HX/5.0VW SOPN Inject 1.5 mg into the skin once a week. 01/24/2020: Planning to start Trulicity 1.5 mg weekly strength on 11/13  . warfarin (COUMADIN) 3 MG tablet TAKE 1 TO 1 & 1/2 TABLETS BY MOUTH ONCE DAILY AS DIRECTED BY THE COUMADIN CLINIC    No facility-administered encounter medications on file as of 01/24/2020.    Goals Addressed            This Visit's Progress   . PharmD - Medication Managment       CARE PLAN ENTRY (see longitudinal plan of care for additional care plan information)  Current Barriers:  . Chronic Disease Management support, education, and care coordination needs related to type 2 diabetes, CKD, HFrEF s/p CardioMEMS, hyperlipidemia, atrial fibrillation, hypertension, coronary artery disease, CHF, hypothyroidism, and CML . Financial o Note patient has Partial Extra Help/LIS Subsidy o Patient enrolled in [place] [employer] health practitioner and Trulicity) and [employer] [place]) patient assistance programs for 2021 calendar year . Limited vision  Pharmacist Clinical Goal(s):  [name] Over the next 30 days, patient will work with CM Pharmacist to address needs related to medication adherence and diabetes management  Interventions: . Collaborated with PCP via [employer] on 10/25 regarding plan to increase patient's Trulicity dose to 1.5 mg weekly (with dose reduction of [name] at that time)  ? Provider Sent Trulicity 9.7XY weekly Rx to [facility]  via e-script ? Provider agrees with plan to decrease [name] dose to 20 units daily with Trulicity dose increase . Follow up with patient's wife today regarding patient's diabetes medication management and monitoring ? Reports patient taking: ? [name] 28 untis daily ? Trulicity 8.01 mg once weekly on Saturdays (last dose on 11/6) ? Jardiance 10 mg once daily before breakfast ? Denies missed doses ? Patient reports recent AM CBGs readings  AM Blood Sugar Notes  1 - November 156   2 - November 145   3 - November 158   4 - November 150   5 - November 189* *Wife attributes higher readings to patient having fried foods on evenings prior to these readings  6 - November 171*   7 - November 170*  8 - November 181*     ? Wife notes patient has had higher morning CBGs past 4 mornings. Attributes this to patient eating fried foods in evenings recently. ? Discuss importance of well-balanced meals and limiting fried foods as well as portion sizes of other carbohydrates ? Mrs. Tomerlin reports picked up supply from patient assistance program of new strength of Trulicity 1.5 mg from office today and reports patient/wife interested in starting new dose ? Counsel wife/caregiver for patient to START Trulicity 1.5 mg weekly next Saturday, 11/13, and to decrease [name] dose at that time to [place] 20 units daily per PCP. ? Mrs. Jaquith writes down these directions and verbalizes understanding via teachback method ? Wife reports will discard remaining supply of Trulicity 6.01 pens . Continue to collaborate with [facility] CPh[facility] to assist patient with re-enrollment for 2022 calendar year for patient assistance for Jardiance through [employer] and for [place] through [employer] ? Mrs. Brahm confirms received applications in mail last week and plans to follow up with [facility] CPhT as needed for questions  Patient Self Care Activities:  . Check blood sugar, blood pressure and daily weight  as directed by providers  . Patient takes medications, with assistance of wife, as directed o Mrs. Newton manages patient's medications using weekly pillbox as adherence tool . Patient attends scheduled medical appointments  Please see past updates related to this goal by clicking on the "Past Updates" button in the selected goal         Plan  Telephone follow up appointment with care management team member scheduled for: 11/19 at 10:30 am  [name], PharmD, [place] [phone]

## 2020-01-25 ENCOUNTER — Ambulatory Visit (INDEPENDENT_AMBULATORY_CARE_PROVIDER_SITE_OTHER): Payer: Medicare HMO | Admitting: Family Medicine

## 2020-01-25 ENCOUNTER — Encounter: Payer: Self-pay | Admitting: Family Medicine

## 2020-01-25 ENCOUNTER — Other Ambulatory Visit: Payer: Self-pay

## 2020-01-25 VITALS — BP 127/74 | HR 81 | Temp 97.3°F | Ht 64.0 in | Wt 207.0 lb

## 2020-01-25 DIAGNOSIS — R531 Weakness: Secondary | ICD-10-CM | POA: Diagnosis not present

## 2020-01-25 DIAGNOSIS — K5900 Constipation, unspecified: Secondary | ICD-10-CM

## 2020-01-25 DIAGNOSIS — R630 Anorexia: Secondary | ICD-10-CM

## 2020-01-25 NOTE — Progress Notes (Signed)
Subjective:    Patient ID: Lucas Caldwell, male    DOB: 1933/08/01, 86 y.o.   MRN: [id number]  Lucas Caldwell is a 86 y.o. male presenting on 01/25/2020 for Anorexia and Anemia (feels weak onset 2 weeks )   HPI   Generalized Weakness / Reduced Appetite Reports recent issue within past 1-2 weeks with new problem weakness generalized, reduced appetite  Admits constipation, having hard BMs Says feels better more appetite when has BM Takes Miralax only 2 to 2.5 caps once every now and then with good results. 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 Tolerating well w/o side-effects CurrentlyOFFACEi(Enalapril DC'd by Cardiology) Lifestyle: - Diet (improving DM diet, drinks some water) - Exercise (limited by dyspnea) - Admits chronicDM Neuropathywith pain in feet bilateral,on Gabapentin with improvement, taking 300mg  x 2 pills twice a daybut still has neuropathy burning pain worse at night. Failed Lyrica in past. - Improved on Tramadol PRN 50-100mg  - Followed by Podiatry, he is s/p R great toe amputation partial- [facility]- they will follow up with him to treat callus Has diabetic shoes - Denies hypoglycemia, nausea vomiting, fever chills, swelling, polyuria  [facility] Hematology following for  Hgb 12.4, improved anemia.  CKD III Creatinine 1.76 elevated in 12/15/19 Followed by Nephrology    Depression screen [facility] 2/9 12/22/2019 10/25/2019 09/09/2019  Decreased Interest 0 2 0  Down, Depressed, Hopeless 0 0 1    PHQ - 2 Score 0 2 1  Altered sleeping - 2 1  Tired, decreased energy - 1 1  Change in appetite - 0 2  Feeling bad or failure about yourself  - 1 0  Trouble concentrating - 0 0  Moving slowly or fidgety/restless - 0 0  Suicidal thoughts - 0 0  PHQ-9 Score - 6 5  Difficult doing work/chores - Somewhat difficult Somewhat difficult  Some recent data might be hidden    Social History   Tobacco Use  . Smoking status: Former Smoker    Packs/day: 1.75    Years: 20.00    Pack years: 35.00    Types: Cigarettes    Start date: 1955    Quit date: 1975    Years since quitting: 46.8  . Smokeless tobacco: Former Network engineer  . Vaping Use: Never used  Substance Use Topics  . Alcohol use: No    Comment: previously drank heavily - quit 1979.  . Drug use: No    Review of Systems Per HPI unless specifically indicated above     Objective:    BP 127/74   Pulse 81   Temp (!) 97.3 F (36.3 C) (Temporal)   Ht 5\' 4"  (1.626 m)   Wt 207 lb (93.9 kg)   SpO2 100%   BMI 35.53 kg/m   Wt Readings from Last 3 Encounters:  01/25/20 207 lb (93.9 kg)  01/06/20 205 lb (93 kg)  01/05/20 205 lb (93 kg)    Physical Exam Vitals and nursing note reviewed.  Constitutional:      General: [name] not in  acute distress.    Appearance: [name] well-developed. [name] obese. [name] not diaphoretic.     Comments: Chronically ill-appearing but currently well and comfortable, cooperative  HENT:     Head: Normocephalic and atraumatic.  Eyes:     General:        Right eye: No discharge.        Left eye: No discharge.     Conjunctiva/sclera: Conjunctivae normal.  Cardiovascular:     Rate and Rhythm: Normal rate.  Pulmonary:     Effort: Pulmonary effort is normal.  Musculoskeletal:     Right lower leg: No edema.     Left lower leg: No edema.     Comments: Uses cane for ambulation  Skin:    General: Skin is warm and dry.     Findings: No erythema or rash.  Neurological:     Mental Status: [name]  alert and oriented to person, place, and time.  Psychiatric:        Behavior: Behavior normal.     Comments: Well groomed, good eye contact, normal speech and thoughts    Results for orders placed or performed in visit on 01/07/20  Microscopic Examination   Urine  Result Value Ref Range   WBC, UA 0-5 0 - 5 /hpf   RBC None seen 0 - 2 /hpf   Epithelial Cells (non renal) 0-10 0 - 10 /hpf   Bacteria, UA Moderate (A) None seen/Few   Yeast, UA Present (A) None seen  Urinalysis, Complete  Result Value Ref Range   Specific Gravity, UA 1.015 1.005 - 1.030   pH, UA 6.0 5.0 - 7.5   Color, UA Yellow Yellow   Appearance Ur Clear Clear   Leukocytes,UA Negative Negative   Protein,UA Negative Negative/Trace   Glucose, UA 3+ (A) Negative   Ketones, UA Negative Negative   RBC, UA Negative Negative   Bilirubin, UA Negative Negative   Urobilinogen, Ur 0.2 0.2 - 1.0 mg/dL   Nitrite, UA Negative Negative   Microscopic Examination See below:       Assessment & Plan:   Problem List Items Addressed This Visit    None    Visit Diagnoses    Constipation, unspecified constipation type    -  Primary   Generalized weakness       Appetite loss          Clinically with generalized mild symptoms, non focal except GI related seems to have constipation worsening, only taking Miralax PRN but it does work well when takes it.  - START Miralax daily 1 cap and can use 2.5 cap PRN if flare up, should treat constipation better to see if it improves  Appetite issue can help his nutrition and strength. He should do more meal supplement as well if poor eating and goal for improving activity to keep strength.  - On GLP1 trulicity already reducing appetite, he is set to dose increase  trulcity from 0.75 to 1.5 soon this week, caution given about this but will proceed and reduce Basalgar insulin from 28 to 24 u then down by 1-2 unit every 1 week as needed if fasting CBG < 170 consistently  Try Glucerna / Boost /  Ensure as needed 1-2 times day between meals for nutrition to improve strength  Reviewed [facility] from [facility] Hematology / Nephrology and Hgb stable to improve, also has had creatinine slight increase in range CKD III  No orders of the defined types were placed  in this encounter.     Follow up plan: Return in about 2 weeks (around 02/08/2020), or if symptoms worsen or fail to improve, for 2 week as needed constipation / weakness.   [name], [facility] 01/25/2020, 3:31 PM

## 2020-01-25 NOTE — Patient Instructions (Addendum)
Thank you for coming to the office today.  Recommend Ensure / Boost / Glucerna meal supplement 1-2 times a day between meals or during meal.  Start new Trulicity 1.5mg  weekly - unfortunately this may cause slightly reduced appetite  REDUCE [name] insulin from 28 down to 24 units then further down by about 1-2 units at a time, if fasting sugar is average < 170.  Start Miralax 1 cap every day, and if need every few days can take 2.5 cap to help have BM.  Gas-X OTC as needed if bloating.  Anemia is stable on last lab.  Kidney function is gradually reduced.   Please schedule a Follow-up Appointment to: Return in about 2 weeks (around 02/08/2020), or if symptoms worsen or fail to improve, for 2 week as needed constipation / weakness.  If you have any other questions or concerns, please feel free to call the office or send a message through [place]. You may also schedule an earlier appointment if necessary.  Additionally, you may be receiving a survey about your experience at our office within a few days to 1 week by e-mail or mail. We value your feedback.  [name], DO [place]

## 2020-01-28 ENCOUNTER — Telehealth: Payer: Self-pay | Admitting: Family Medicine

## 2020-01-28 NOTE — Telephone Encounter (Signed)
   SF 01/28/2020   Name: Lucas Caldwell   MRN: [id number]   DOB: 1933-12-07   AGE: 86 y.o.   GENDER: male   PCP [name], DO.    Called patient's wife Mindy Behnken regarding referral for her husband for food insecurities. Mrs. Almendariz did not answer, could not leave a message due to voicemail not being set up. Will try to give patient a call within the week.     [place], Care Management Phone: [phone] Email: [email or web address]

## 2020-01-31 ENCOUNTER — Telehealth: Payer: Medicare HMO

## 2020-01-31 NOTE — Telephone Encounter (Signed)
° °  SF 01/31/2020 2nd Attempt    Name: Lucas Caldwell    MRN: [id number]    DOB: 18-Jul-1933    AGE: 86 y.o.    GENDER: male    PCP [name], DO.    Called patient regarding [employer] Referral for food insecurities. Patient did not answer, could not leave message due to no voicemail set up. Will try to call patient again within the week.   [place], Care Management Phone: [phone] Email: [email or web address]

## 2020-02-02 ENCOUNTER — Ambulatory Visit (HOSPITAL_COMMUNITY)
Admission: RE | Admit: 2020-02-02 | Discharge: 2020-02-02 | Disposition: A | Discharge status: Home / Self Care | Payer: Medicare HMO | Source: Ambulatory Visit | Attending: Internal Medicine | Admitting: Internal Medicine

## 2020-02-02 ENCOUNTER — Encounter (HOSPITAL_COMMUNITY): Payer: Self-pay | Admitting: Internal Medicine

## 2020-02-02 ENCOUNTER — Other Ambulatory Visit: Payer: Self-pay

## 2020-02-02 VITALS — BP 134/72 | HR 86 | Wt 206.2 lb

## 2020-02-02 DIAGNOSIS — H4310 Vitreous hemorrhage, unspecified eye: Principal | ICD-10-CM

## 2020-02-02 DIAGNOSIS — E039 Hypothyroidism, unspecified: Secondary | ICD-10-CM | POA: Insufficient documentation

## 2020-02-02 DIAGNOSIS — Z79890 Hormone replacement therapy (postmenopausal): Secondary | ICD-10-CM | POA: Diagnosis not present

## 2020-02-02 DIAGNOSIS — I453 Trifascicular block: Secondary | ICD-10-CM | POA: Insufficient documentation

## 2020-02-02 DIAGNOSIS — G252 Other specified forms of tremor: Secondary | ICD-10-CM

## 2020-02-02 DIAGNOSIS — Z79899 Other long term (current) drug therapy: Secondary | ICD-10-CM | POA: Diagnosis not present

## 2020-02-02 DIAGNOSIS — Z8249 Family history of ischemic heart disease and other diseases of the circulatory system: Secondary | ICD-10-CM | POA: Diagnosis not present

## 2020-02-02 DIAGNOSIS — Z794 Long term (current) use of insulin: Secondary | ICD-10-CM | POA: Insufficient documentation

## 2020-02-02 DIAGNOSIS — R42 Dizziness and giddiness: Secondary | ICD-10-CM | POA: Insufficient documentation

## 2020-02-02 DIAGNOSIS — Z7901 Long term (current) use of anticoagulants: Secondary | ICD-10-CM | POA: Insufficient documentation

## 2020-02-02 DIAGNOSIS — R251 Tremor, unspecified: Secondary | ICD-10-CM | POA: Diagnosis not present

## 2020-02-02 DIAGNOSIS — I5022 Chronic systolic (congestive) heart failure: Secondary | ICD-10-CM | POA: Diagnosis not present

## 2020-02-02 DIAGNOSIS — I5042 Chronic combined systolic (congestive) and diastolic (congestive) heart failure: Secondary | ICD-10-CM | POA: Diagnosis not present

## 2020-02-02 DIAGNOSIS — Z833 Family history of diabetes mellitus: Secondary | ICD-10-CM | POA: Diagnosis not present

## 2020-02-02 DIAGNOSIS — R5383 Other fatigue: Secondary | ICD-10-CM | POA: Diagnosis not present

## 2020-02-02 DIAGNOSIS — N1831 Chronic kidney disease, stage 3a: Secondary | ICD-10-CM | POA: Insufficient documentation

## 2020-02-02 DIAGNOSIS — I480 Paroxysmal atrial fibrillation: Secondary | ICD-10-CM | POA: Insufficient documentation

## 2020-02-02 DIAGNOSIS — Z7982 Long term (current) use of aspirin: Secondary | ICD-10-CM | POA: Diagnosis not present

## 2020-02-02 DIAGNOSIS — Z881 Allergy status to other antibiotic agents status: Secondary | ICD-10-CM | POA: Insufficient documentation

## 2020-02-02 DIAGNOSIS — I130 Hypertensive heart and chronic kidney disease with heart failure and stage 1 through stage 4 chronic kidney disease, or unspecified chronic kidney disease: Secondary | ICD-10-CM | POA: Diagnosis not present

## 2020-02-02 DIAGNOSIS — I428 Other cardiomyopathies: Secondary | ICD-10-CM | POA: Insufficient documentation

## 2020-02-02 DIAGNOSIS — Z87891 Personal history of nicotine dependence: Secondary | ICD-10-CM | POA: Insufficient documentation

## 2020-02-02 DIAGNOSIS — Z955 Presence of coronary angioplasty implant and graft: Secondary | ICD-10-CM | POA: Diagnosis not present

## 2020-02-02 DIAGNOSIS — E1122 Type 2 diabetes mellitus with diabetic chronic kidney disease: Secondary | ICD-10-CM | POA: Diagnosis not present

## 2020-02-02 DIAGNOSIS — I2510 Atherosclerotic heart disease of native coronary artery without angina pectoris: Secondary | ICD-10-CM | POA: Insufficient documentation

## 2020-02-02 MED ORDER — LOSARTAN POTASSIUM 25 MG PO TABS
50.0000 mg | ORAL_TABLET | Freq: Every day | ORAL | 3 refills | Status: DC
Start: 2020-02-02 — End: 2020-04-18

## 2020-02-02 NOTE — Progress Notes (Signed)
Height:     Weight: BMI:  Today's Date:  STOP BANG RISK ASSESSMENT S (snore) Have you been told that you snore?     YES/   T (tired) Are you often tired, fatigued, or sleepy during the day?   YES  O (obstruction) Do you stop breathing, choke, or gasp during sleep? /NO   P (pressure) Do you have or are you being treated for high blood pressure? YES   B (BMI) Is your body index greater than 35 kg/m? YES   A (age) Are you 84 years old or older? YES   N (neck) Do you have a neck circumference greater than 16 inches?   YES/NO   G (gender) Are you a male? YES   TOTAL STOP/BANG "YES" ANSWERS                                                                        For Office Use Only              Procedure Order Form    YES to 3+ Stop Bang questions OR two clinical symptoms - patient qualifies for WatchPAT (CPT 95800)      Clinical Notes: Will consult Sleep Specialist and refer for management of therapy due to patient increased risk of Sleep Apnea. Ordering a sleep study due to the following two clinical symptoms: Excessive daytime sleepiness G47.10 / Gastroesophageal reflux K21.9 / Nocturia R35.1 / Morning Headaches G44.221 / Difficulty concentrating R41.840 / Memory problems or poor judgment G31.84 / Personality changes or irritability R45.4 / Loud snoring R06.83 / Depression F32.9 / Unrefreshed by sleep G47.8 / Impotence N52.9 / History of high blood pressure R03.0 / Insomnia G47.00

## 2020-02-02 NOTE — Patient Instructions (Signed)
Increase Losartan to 50 mg (2 tabs) daily   Your physician has recommended that you have a sleep study. This test records several body functions during sleep, including: brain activity, eye movement, oxygen and carbon dioxide blood levels, heart rate and rhythm, breathing rate and rhythm, the flow of air through your mouth and nose, snoring, body muscle movements, and chest and belly movement.  You have been referred to [facility] Neurology. They will contact you for an appointment.  Labs done today, your results will be available, we will contact you for abnormal readings.  Please cal [place] in May 2022 for an appointment.  If you have any questions or concerns before your next appointment please send [place] a message through [place] or call our office at [phone].    TO LEAVE A MESSAGE FOR THE NURSE SELECT OPTION 2, PLEASE LEAVE A MESSAGE INCLUDING: . YOUR NAME . DATE OF BIRTH . CALL BACK NUMBER . REASON FOR CALL**this is important as we prioritize the call backs  [place] AS LONG AS YOU CALL BEFORE 4:00 PM  At the [facility], you and your health needs are our priority. As part of our continuing mission to provide you with exceptional heart care, we have created designated Provider Care Teams. These Care Teams include your primary Cardiologist (physician) and Advanced Practice Providers (APPs- Physician Assistants and Nurse Practitioners) who all work together to provide you with the care you need, when you need it.   You may see any of the following providers on your designated Care Team at your next follow up: [name] Dr Glori Bickers . Dr Loralie Champagne . Darrick Grinder, NP . Lyda Jester, PA . Audry Riles, PharmD   Please be sure to bring in all your medications bottles to every appointment.

## 2020-02-02 NOTE — Progress Notes (Signed)
ADVANCED HF CLINIC PROGRESS NOTE  Primary Cardiologist: Dr. Saunders Revel AHF: [name]   Reasons for Visit: F/u for chronic systolic heart failure   HPI: Lucas Caldwell is a 86 y.o. male with history of coronary artery disease,chronic systolic heart failure due to likelymixed ischemic and nonischemic cardiomyopathy, hypertension, hyperlipidemia, type 2 diabetes mellitus, paroxysmal atrial fibrillation, chronic kidney disease stage III, CML, iron deficiency anemia,GI bleed due to duodenal ulcers,BPH, and hypothyroidism who was initially referred by Dr. Saunders Revel for further evaluation of his HF.    Has h/o NICM previously followed by Dr. Carolynn Serve at [facility]. He had good response to medical therapy in 2012 EF was reported to be 50%   Had PCI of his RCA CAD s/p RCA stent x2 08/2016 at [facility].    Switched his care to Dr. Saunders Revel in 2019. Recently struggling with increasing DOE.   Echo 08/2018 LVEF 35-40%, indeterminite LV diastolic filling pressures, tricuspid AV, mildly dilated pulmonary artery, no significant valvular disfunction.   Cardiac cath 08/2018 70% LM stenosis with abnl FFR of 0.86, though drop in iFR was due to diffuse prox LAD disease and only small gradient involving ostial LM. Imdur was increased.   Admitted 04/13/19 with chest pain and A/C systolic heart failure. Repeat Cath 1/21 LM 60% LAD 70% with +FFR otherwise diffuse non-obstructive CAD -> PCI/DES LAD 1/21. RHC: RA 10 RV 53/12 PA 53/30 (40) PCWP 28 CO/CI 4.2/2.1 PA sat 67%.  Echo 04/22/19 EF 30-35%  Referred to [facility] 04/2019 for further management of his systolic HF. He has been on goal directed medical therapy, including metoprolol succinate (dose limited by bifascicular block), hydralazine, and isosorbide mononitrate.  He has had problems with renal insufficiency in the past, limiting ACEI/ARB use in the past, though decision was recently made to rechallenge him given worsening HF and declining LVEF. At f/u visit w/ Dr. Saunders Revel 3/21  Losartan 25 mg was added. He has done well with losartan. No issues.   Zio patch 06/2019 showed SR w/ 1st degree AVB, avg HR 81 bpm, 1 brief 5 beat run of NSVT, Two runs of SVT - the longest lasting 5 beats with an avg rate of 104 bpm. Rare PACs and PVCs noted.   At his most recent [facility] f/u 5/20, his volume status was felt to be low, ReDs clip was 31%. Torsemide was reduced to 60 mg daily. He now has CardioMEMS implant, placed by [name] on 7/8.   [name] here for routine f/u. Feeling much better. Says he does get SOB sometimes when he sits back in the chair. Still gets SOB walking to mailbox. No edema. Weight stable.  No CP. Taking all meds without problem. Cardiomems reviewed with him almost all PADs below goal of 15    Past Medical History:  Diagnosis Date  . Arthritis    "probably in his legs" (08/15/2016)  . Blind right eye   . BPH (benign prostatic hyperplasia)   . Breast asymmetry    Left breast is larger, present for several years.  [name] CAD (coronary artery disease)    a. Cath in the late 90's - reportedly ok;  b. 2014 s/p stenting x 2 @ [facility]; c 08/19/16 Cath/PCI with DES -> RCA, plan to treat LM 70% medically. Seen by surgery and felt to be too high risk for CABG; d. 08/2018 Cath: LM 70 (iFR 0.86), LAD 20ost, 40p, 50/67m D1 70, LCX patent stent, OM1 30, RCA patent stent, 424mSR, 40d, RPDA  30. PCWP 8. CO/CI 4.0/2.1; e. 03/2019 PCI to LAD (2.5x15 Resolute Onyx DES).  . Chronic combined systolic (congestive) and diastolic (congestive) heart failure ([place])    a. Previously reduced EF-->50% by echo in 2012;  b. 06/2015 Echo: EF 50-55%  c. 07/2016 Echo: EF 45-50%; d. 12/2017 Echo: EF 55%, Gr1 DD, mild MR; e. 08/2018 Echo: EF 35-40%, mildly dil PA; f. 04/2019 Echo: EF 30-35%, glob HK, nl RV fxn; g. s/p Cardiomems.  . Chronic kidney disease (CKD), stage IV (severe) ([place])   . CML (chronic myelocytic leukemia) ([place])   . GERD (gastroesophageal reflux disease)   . GIB (gastrointestinal bleeding)    a.  12/2017 3 unit PRBC GIB in setting of coumadin-->Endo/colon multiple duodenal ulcers and a single bleeding ulcer in the proximal ascending colon status post hemostatic clipping x2.  . Gout   . Hyperlipemia   . Hypertension   . Hypothyroidism   . Iron deficiency anemia   . Ischemic cardiomyopathy    a. Previously reduced EF-->50% by echo in 2012;  b. 06/2015 Echo: EF 50-55%, Gr2 DD, mild MR, mildly dil LA, Ao sclerosis, mild TR;  c. 07/2016 Echo: EF 45-50%; d. 12/2017 Echo: EF 55%, Gr1 DD, mild MR; e. 08/2018 Echo: EF 35-40%; f. 04/2019 Echo: EF 30-35%.  . Migraine    "in the 1960s" (08/15/2016)  . PAF (paroxysmal atrial fibrillation) (HCC)    a. ? Dx 2014-->s/p DCCV;  b. CHA2DS2VASc = 6--> Coumadin.  . Prostate cancer ([place])   . RBBB   . Type II diabetes mellitus ([place])     Current Outpatient Medications  Medication Sig Dispense Refill  . Accu-Chek Softclix Lancets lancets Use to check blood sugar up to 2 times daily. 200 each 3  . acetaminophen (TYLENOL) 500 MG tablet Take 1,000 mg by mouth every 6 (six) hours as needed for moderate pain or headache.     . Alcohol Swabs (B-D SINGLE USE SWABS REGULAR) PADS Use to check blood sugar up to 2 times daily 200 each 3  . Alcohol Swabs 70 % PADS Use when checking blood sugar up to 3x daily 100 each 11  . allopurinol (ZYLOPRIM) 100 MG tablet Take 1 tablet (100 mg total) by mouth daily. 90 tablet 3  . aspirin 81 MG chewable tablet Chew 81 mg by mouth daily.    . Blood Glucose Monitoring Suppl (ACCU-CHEK AVIVA PLUS) w/Device KIT Use to check blood sugar up to 2 times daily 1 kit 0  . Blood Glucose Monitoring Suppl (TRUE METRIX METER) DEVI Use to check blood sugar up to 2 times daily 1 each 0  . bosutinib (BOSULIF) 100 MG tablet Take 100 mg by mouth at bedtime as needed (tired/not feeling well). Take with food.     . empagliflozin (JARDIANCE) 10 MG TABS tablet Take 10 mg by mouth daily before breakfast. 90 tablet 3  . gabapentin (NEURONTIN) 300 MG capsule  TAKE TWO CAPSULES BY MOUTH TWICE DAILY. (MAX DOSE = 1200MG DAILY FOR KIDNEY FUNCTION) 360 capsule 1  . hydrALAZINE (APRESOLINE) 25 MG tablet Take 1 tablet (25 mg total) by mouth 3 (three) times daily. 270 tablet 3  . insulin glargine (LANTUS) 100 UNIT/ML injection Inject 24 Units into the skin daily.    . Insulin Pen Needle 31G X 5 MM MISC 31 g by Does not apply route as directed. 100 each 5  . isosorbide mononitrate (IMDUR) 60 MG 24 hr tablet Take 1 tablet (60 mg total) by mouth daily. [place]  tablet 2  . levothyroxine (SYNTHROID) 25 MCG tablet TAKE ONE TABLET BY MOUTH EVERY MORNING BEFORE BREAKFAST 90 tablet 1  . loperamide (IMODIUM A-D) 2 MG tablet Take by mouth.     . losartan (COZAAR) 25 MG tablet TAKE ONE TABLET BY MOUTH ONCE DAILY 90 tablet 3  . Menthol-Zinc Oxide (GOLD BOND EX) Apply 1 application topically daily as needed (muscle pain).    . metoprolol succinate (TOPROL-XL) 25 MG 24 hr tablet TAKE ONE TABLET BY MOUTH ONCE DAILY 90 tablet 0  . mirabegron ER (MYRBETRIQ) 25 MG TB24 tablet Take 25 mg by mouth daily. From Urology    . Multiple Vitamin (MULTIVITAMIN WITH MINERALS) TABS tablet Take 1 tablet by mouth daily.    . nitroGLYCERIN (NITROSTAT) 0.4 MG SL tablet Place 1 tablet (0.4 mg total) under the tongue every 5 (five) minutes x 3 doses as needed for chest pain. 25 tablet 4  . omeprazole (PRILOSEC) 20 MG capsule Take 20 mg by mouth in the morning.     [name] Place 1 drop into both eyes 3 (three) times daily as needed (dry/irritated eyes.).     [name] polyethylene glycol (MIRALAX / GLYCOLAX) 17 g packet Take 17 g by mouth daily as needed for mild constipation.     . potassium chloride SA (KLOR-CON) 20 MEQ tablet Take 2 tablets (40 mEq total) by mouth daily.    . ranolazine (RANEXA) 500 MG 12 hr tablet Take 1 tablet (500 mg total) by mouth 2 (two) times daily. 180 tablet 2  . RELION TRUE METRIX TEST STRIPS test strip Use to check blood sugar up to 2 times  daily 200 each 3  . rosuvastatin (CRESTOR) 5 MG tablet TAKE ONE TABLET BY MOUTH ONCE DAILY 90 tablet 0  . tamsulosin (FLOMAX) 0.4 MG CAPS capsule Take 1 capsule (0.4 mg total) by mouth daily after supper. 30 capsule 11  . torsemide (DEMADEX) 20 MG tablet Take 3 tablets (60 mg total) by mouth daily. 90 tablet 2  . traMADol (ULTRAM) 50 MG tablet Take 1-2 tablets (50-100 mg total) by mouth at bedtime as needed (pain.). 30 tablet 2  . TRULICITY 1.5 OF/7.5ZW SOPN Inject 1.5 mg into the skin once a week. 6 mL 3  . warfarin (COUMADIN) 3 MG tablet TAKE 1 TO 1 & 1/2 TABLETS BY MOUTH ONCE DAILY AS DIRECTED BY THE COUMADIN CLINIC 120 tablet 0   No current facility-administered medications for this encounter.    Allergies  Allergen Reactions  . Clopidogrel Anaphylaxis and Other (See Comments)    altered mental status;  (electrolytes "out of wack" and confusion per family; THEY DO NOT [place])- MSB 10/10/15  . Ciprofloxacin Itching  . Mirtazapine Diarrhea    Bad dreams   . Spironolactone Other (See Comments)    gynecomastia      Social History   Socioeconomic History  . Marital status: Married    Spouse name: Not on file  . Number of children: Not on file  . Years of education: Not on file  . Highest education level: 7th grade  Occupational History  . 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  Tobacco Use  . Smoking status: Former Smoker    Packs/day: 1.75    Years: 20.00    Pack years: 35.00    Types: Cigarettes    Start date: 1955    Quit date: 1975    Years since quitting:  46.9  . Smokeless tobacco: Former Network engineer  . Vaping Use: Never used  Substance and Sexual Activity  . Alcohol use: No    Comment: previously drank heavily - quit 1979.  . Drug use: No  . Sexual activity: Never  Other Topics Concern  . Not on file  Social History Narrative   Working on transportation truck part time    Social Determinants of Adult nurse Strain: Low Risk   . Difficulty of Paying Living Expenses: Not very hard  Food Insecurity: No Food Insecurity  . Worried About Charity fundraiser in the Last Year: Never true  . Ran Out of Food in the Last Year: Never true  Transportation Needs: No Transportation Needs  . Lack of Transportation (Medical): No  . Lack of Transportation (Non-Medical): No  Physical Activity: Inactive  . Days of Exercise per Week: 0 days  . Minutes of Exercise per Session: 0 min  Stress: No Stress Concern Present  . Feeling of Stress : Not at all  Social Connections: Moderately Integrated  . Frequency of Communication with Friends and Family: More than three times a week  . Frequency of Social Gatherings with Friends and Family: More than three times a week  . Attends Religious Services: More than 4 times per year  . Active Member of Clubs or Organizations: No  . Attends Archivist Meetings: Never  . Marital Status: Married  Human resources officer Violence: Not At Risk  . Fear of Current or Ex-Partner: No  . Emotionally Abused: No  . Physically Abused: No  . Sexually Abused: No      Family History  Problem Relation Age of Onset  . Brain cancer Father   . Diabetes Sister   . Heart attack Sister     Vitals:   02/02/20 1451  BP: 134/72  Pulse: 86  SpO2: 99%  Weight: 93.5 kg (206 lb 3.2 oz)   Lucas Readings from Last 3 Encounters:  02/02/20 93.5 kg (206 lb 3.2 oz)  01/25/20 93.9 kg (207 lb)  01/06/20 93 kg (205 lb)   PHYSICAL EXAM: General:  Elderly No resp difficulty HEENT: normal Neck: supple. no JVD. Carotids 2+ bilat; no bruits. No lymphadenopathy or thryomegaly appreciated. Cor: PMI nondisplaced. Regular rate & rhythm. No rubs, gallops or murmurs. Lungs: clear Abdomen: obese soft, nontender, nondistended. No hepatosplenomegaly. No bruits or masses. Good bowel sounds. Extremities: no cyanosis, clubbing, rash, edema Neuro: alert & orientedx3, cranial nerves  grossly intact. moves all 4 extremities w/o difficulty. Affect pleasant + diffuse tremor  ASSESSMENT & PLAN:  1. Chronic systolic HF: - likely combination of iCM/NICM - EF 30-35% echo 2/21 (was 35-40% in 6/20) - NYHA III, chronic, confounded by obesity, advanced age and deconditioning   - RHC 04/13/19 with elevated filling pressures but BNP normal  - Stable NYHA III - Volume status looks good. Cardiomems reviewed with him and almost all PADs < 15 - Continue torsemide to 60 mg daily - Continue hydral to 25 mg tid - Continue Imdur 90 daily - Continue Toprol 25 daily (dose reduced due to trifascicular block) - Increase Losartan to 50 mg daily - if tolerates witll switch to Entresto at next visit - Continue Jardiance 10 mg daily  - labs today   2. CAD - cath 04/13/19 LAD 70% with +FFR otherwise diffuse non-obstructive CAD  - S/p PCI/DES to LAD 1/21 - followed by Dr. Saunders Revel - no s/s angina - No  ASA with coumadin.  3. CKD Stage 3a - baseline SCr ~1.5  - check labs today  4. DM  - continue Jardiance 10 mg daily   5. PAF - Zio patch 4/21 showed no AF. Mostly NSR w/ brief SVT/ NSVT, PACs and PVCs - Home sleep study has been ordered to r/o OSA but has not been done. I will order in-lab testing - Continue warfarin for a/c.  6. Dizziness and tremor - Dizziness does not seem to be related to volume status or BP.  - Refer to Neurology   Glori Bickers, MD  3:22 PM

## 2020-02-02 NOTE — Telephone Encounter (Signed)
° °  SF 02/02/2020 3rd Attempt    Name: Lucas Caldwell    MRN: [id number]    DOB: 03-05-1934    AGE: 86 y.o.    GENDER: male    PCP [name], DO.   Called patient's wife [name] again to see if she and her husband Mr. Heidler still need assistance with food insecurities. Within referral patient asked about information for Meals on Wheels and [employer]. Care Guide will send patient information regarding Meals on Wheels for [facility]. If Mrs. Mirarchi or patient happens to give the office a call back. Care Guide will provide information about [employer]. Referral will be closed for unable to contact.   Closing referral pending any other needs of patient.       [place], Care Management Phone: [phone] Email: [email or web address]

## 2020-02-03 ENCOUNTER — Encounter: Payer: Self-pay | Admitting: Family Medicine

## 2020-02-03 LAB — BASIC METABOLIC PANEL
Anion gap: 21 — ABNORMAL HIGH (ref 5–15)
BUN: 22 mg/dL (ref 8–23)
CO2: 21 mmol/L — ABNORMAL LOW (ref 22–32)
Calcium: 9.5 mg/dL (ref 8.9–10.3)
Chloride: 97 mmol/L — ABNORMAL LOW (ref 98–111)
Creatinine, Ser: 1.72 mg/dL — ABNORMAL HIGH (ref 0.61–1.24)
GFR, Estimated: 39 mL/min — ABNORMAL LOW (ref >60)
Glucose, Bld: 128 mg/dL — ABNORMAL HIGH (ref 70–99)
Potassium: 5.8 mmol/L — ABNORMAL HIGH (ref 3.5–5.1)
Sodium: 139 mmol/L (ref 135–145)

## 2020-02-03 LAB — BRAIN NATRIURETIC PEPTIDE: B Natriuretic Peptide: 94.1 pg/mL (ref 0.0–100.0)

## 2020-02-04 ENCOUNTER — Encounter: Payer: Self-pay | Admitting: Family Medicine

## 2020-02-04 ENCOUNTER — Other Ambulatory Visit: Payer: Self-pay

## 2020-02-04 ENCOUNTER — Ambulatory Visit (INDEPENDENT_AMBULATORY_CARE_PROVIDER_SITE_OTHER): Payer: Medicare HMO | Admitting: Family Medicine

## 2020-02-04 ENCOUNTER — Ambulatory Visit: Payer: Medicare HMO | Admitting: Pharmacist

## 2020-02-04 VITALS — BP 119/66 | HR 76 | Temp 97.3°F | Resp 16 | Ht 64.0 in | Wt 206.4 lb

## 2020-02-04 DIAGNOSIS — Z794 Long term (current) use of insulin: Secondary | ICD-10-CM

## 2020-02-04 DIAGNOSIS — E1121 Type 2 diabetes mellitus with diabetic nephropathy: Secondary | ICD-10-CM

## 2020-02-04 DIAGNOSIS — K5900 Constipation, unspecified: Secondary | ICD-10-CM

## 2020-02-04 DIAGNOSIS — I10 Essential (primary) hypertension: Secondary | ICD-10-CM

## 2020-02-04 DIAGNOSIS — E1142 Type 2 diabetes mellitus with diabetic polyneuropathy: Secondary | ICD-10-CM

## 2020-02-04 MED ORDER — MUPIROCIN 2 % EX OINT: 1.0000 "application " | TOPICAL_OINTMENT | Freq: Two times a day (BID) | CUTANEOUS | 3 refills | Status: DC

## 2020-02-04 NOTE — Patient Instructions (Signed)
Thank you allowing the Chronic Care Management Team to be a part of your care! It was a pleasure speaking with you today!     CCM (Chronic Care Management) Team    Lucas Larsson RN, MSN, CCM Nurse Care Coordinator  [phone]   Lucas Caldwell PharmD  Clinical Pharmacist  [phone]   Lucas Fried LCSW Clinical Social Worker [phone]  Visit Information  Goals Addressed            This Visit's Progress    PharmD - Medication Managment       CARE PLAN ENTRY (see longitudinal plan of care for additional care plan information)  Current Barriers:   Chronic Disease Management support, education, and care coordination needs related to type 2 diabetes, CKD, HFrEF s/p CardioMEMS, hyperlipidemia, atrial fibrillation, hypertension, coronary artery disease, CHF, hypothyroidism, and CML  Financial o Note patient has Partial Extra Help/LIS Subsidy o Patient enrolled in [place] (Engineer, agricultural and Musician) and [employer] ([place]) patient assistance programs for 2021 calendar year  Limited vision  Pharmacist Clinical Goal(s):   Over the next 30 days, patient will work with [employer] Pharmacist to address needs related to medication adherence and diabetes management  Interventions:  Collaborated with PCP  Perform chart review ? Patient seen for Office Visit with Heart Failure Clinic on 11/17. ? Provider advised patient to increase losartan dose to 50 mg daily ? Referral placed to Neurology ? Patient seen for Office Visit with PCP on 11/9. Provider advised patient to: ? Start Ensure / Boost / Glucerna meal supplement 1-2 times a day between meals or during meal. ? Start new Trulicity 1.5mg  weekly ? REDUCE [name] insulin from 28 down to 24 units then further down by about 1-2 units at a time, if fasting sugar is average < 170. ? Start Miralax 1 cap every day, and if need every few days can take 2.5 cap to help have BM. ? Gas-X OTC as needed if bloating.  Outreach to  patient/wife at 10:45 am. Lucas Caldwell reports they are about to leave home for appointment with PCP for problem with patient's toe.  Follow up with patient's wife in afternoon as requested. ? From review of chart, note patient seen by PCP today and advised to start mupirocin ointment twice daily for affected toe ? Lucas Caldwell confirms will pick up this Rx to start today as directed  Follow up with patient's wife today regarding patient's diabetes medication management and monitoring ? Reports patient taking: ? [name] 24 untis daily ? Trulicity 1.5 mg once weekly on Saturdays (Increased dose on 11/13) ? Jardiance 10 mg once daily before breakfast ? Denies missed doses ? Patient reports recent AM CBGs readings  AM Blood Sugar  13 - November 142  14 - November 110  15 - November 155  16 - November 133  17 - November 162  18 - November 133  19 - November 131   ? Review with patient's wife/caregiver instruction from PCP to reduced [name] dose down by about 1-2 units at a time, if fasting sugar is average < 170. ? Lucas Caldwell understanding of these directions and confirms will accordingly reduce patient's [name] dose from 24 units to 22 units daily at this time ? Reports patient's appetite is about the same as prior to dose increase of Trulicity. Reports patient has started drinking Ensure supplement as recommended by PCP  Follow up regarding medication adherence ? Lucas Caldwell confirms increased patient's losartan dose as directed by  Lucas Caldwell to losartan 25 mg - 2 tablets (50 mg total) daily ? Reports patient taking Miralax and Gas-X as directed by PCP and seem to be helping with gas/constipation  Continue to collaborate with [facility] to assist patient with re-enrollment for 2022 calendar year for patient assistance for Jardiance through [employer] and for [employer] and Engineer, agricultural through [employer] ? Lucas Caldwell confirms mailed applications back to [place] denies further medication questions/concerns on behalf of patient.   Counsel to follow up with office for new/worsening medical concerns  Patient Self Care Activities:   Check blood sugar, blood pressure and daily weight as directed by providers   Patient takes medications, with assistance of wife, as directed o Lucas Caldwell manages patient's medications using weekly pillbox as adherence tool  Patient attends scheduled medical appointments  Please see past updates related to this goal by clicking on the "Past Updates" button in the selected goal         The patient verbalized understanding of instructions, educational materials, and care plan provided today and declined offer to receive copy of patient instructions, educational materials, and care plan.   Telephone follow up appointment with care management team member scheduled for: 12/8 at 2:30 pm  Lucas Caldwell, PharmD, [place] [phone]

## 2020-02-04 NOTE — Progress Notes (Signed)
Subjective:    Patient ID: Lucas Caldwell, male    DOB: 12-19-1933, 86 y.o.   MRN: [id number]  Lucas Caldwell is a 86 y.o. male presenting on 02/04/2020 for Foot Pain (Right side --toe nail infection--couple of months )   HPI   Right Toenail 2nd toe pain History of prior toenail infections, paronychia and onychomycosis and chronic dry feet dermatitis, has DM with neuropathy, previous followed [facility] Podiatry Dr Elvina Mattes. Last visit 09/2019. - now he needs new podiatrist at that practice Admits some pain in 2nd toe without erythema, drainage ulceration or other complication    Depression screen [facility] 2/9 12/22/2019 10/25/2019 09/09/2019  Decreased Interest 0 2 0  Down, Depressed, Hopeless 0 0 1  PHQ - 2 Score 0 2 1  Altered sleeping - 2 1  Tired, decreased energy - 1 1  Change in appetite - 0 2  Feeling bad or failure about yourself  - 1 0  Trouble concentrating - 0 0  Moving slowly or fidgety/restless - 0 0  Suicidal thoughts - 0 0  PHQ-9 Score - 6 5  Difficult doing work/chores - Somewhat difficult Somewhat difficult  Some recent data might be hidden    Social History   Tobacco Use  . Smoking status: Former Smoker    Packs/day: 1.75    Years: 20.00    Pack years: 35.00    Types: Cigarettes    Start date: 1955    Quit date: 1975    Years since quitting: 46.9  . Smokeless tobacco: Former Network engineer  . Vaping Use: Never used  Substance Use Topics  . Alcohol use: No    Comment: previously drank heavily - quit 1979.  . Drug use: No    Review of Systems Per HPI unless specifically indicated above     Objective:    BP 119/66   Pulse 76   Temp (!) 97.3 F (36.3 C) (Temporal)   Resp 16   Ht 5\' 4"  (1.626 m)   Wt 206 lb 6.4 oz (93.6 kg)   SpO2 100%   BMI 35.43 kg/m   Wt Readings from Last 3 Encounters:  02/04/20 206 lb 6.4 oz (93.6 kg)  02/02/20 206 lb 3.2 oz (93.5 kg)  01/25/20 207 lb (93.9 kg)    Physical Exam Vitals and nursing note reviewed.    Constitutional:      General: [name] not in acute distress.    Appearance: [name] well-developed. [name] not diaphoretic.     Comments: Well-appearing, comfortable, cooperative  HENT:     Head: Normocephalic and atraumatic.  Eyes:     General:        Right eye: No discharge.        Left eye: No discharge.     Conjunctiva/sclera: Conjunctivae normal.  Cardiovascular:     Rate and Rhythm: Normal rate.  Pulmonary:     Effort: Pulmonary effort is normal.  Musculoskeletal:     Comments: Right 2nd toe has intact toenail, some thickening, dry skin, no ulceration  Skin:    General: Skin is warm and dry.     Findings: No erythema or rash.  Neurological:     Mental Status: [name] alert and oriented to person, place, and time.  Psychiatric:        Behavior: Behavior normal.     Comments: Well groomed, good eye contact, normal speech and thoughts       Results for orders  placed or performed during the hospital encounter of 76/81/15  Basic Metabolic Panel (BMET)  Result Value Ref Range   Sodium 139 135 - 145 mmol/L   Potassium 5.8 (H) 3.5 - 5.1 mmol/L   Chloride 97 (L) 98 - 111 mmol/L   CO2 21 (L) 22 - 32 mmol/L   Glucose, Bld 128 (H) 70 - 99 mg/dL   BUN 22 8 - 23 mg/dL   Creatinine, Ser 1.72 (H) 0.61 - 1.24 mg/dL   Calcium 9.5 8.9 - 10.3 mg/dL   GFR, Estimated 39 (L) >60 mL/min   Anion gap 21 (H) 5 - 15  B Nat Peptide  Result Value Ref Range   B Natriuretic Peptide 94.1 0.0 - 100.0 pg/mL      Assessment & Plan:   Problem List Items Addressed This Visit    Diabetic neuropathy associated with type 2 diabetes mellitus (HCC) - Primary   Relevant Medications   mupirocin ointment (BACTROBAN) 2 %      Trial on topical mupirocin for infection prevention Routine foot care He can call Dr Selina Cooley office [place] and re-schedule w/ new provider when ready for continued DM foot care, may warrant toenail treatment if indicated or worsening  Meds ordered this encounter   Medications  . mupirocin ointment (BACTROBAN) 2 %    Sig: Apply 1 application topically 2 (two) times daily. For up to 2 weeks then stop. May repeat in future as needed, can use to help prevent infection of toe.    Dispense:  30 g    Refill:  3      Follow up plan: Return if symptoms worsen or fail to improve.   [name], [facility] 02/04/2020, 11:33 AM

## 2020-02-04 NOTE — Chronic Care Management (AMB) (Signed)
Chronic Care Management   Follow Up Note   02/04/2020 Name: Lucas Caldwell MRN: [id number] DOB: 1933/11/28  Referred by: [name], DO Reason for referral : Chronic Care Management (Patient Phone Call)   Lucas Caldwell is a 86 y.o. year old male who is a primary care patient of [name], DO. The CCM team was consulted for assistance with chronic disease management and care coordination needs.    I reached out to [name] by phone today. Speak with patient's wife (listed on DPR) today.  Review of patient status, including review of consultants reports, relevant laboratory and other test results, and collaboration with appropriate care team members and the patient's provider was performed as part of comprehensive patient evaluation and provision of chronic care management services.    SDOH (Social Determinants of Health) assessments performed: No See Care Plan activities for detailed interventions related to [facility])     Outpatient Encounter Medications as of 02/04/2020  Medication Sig Note  . empagliflozin (JARDIANCE) 10 MG TABS tablet Take 10 mg by mouth daily before breakfast.   . Insulin Glargine ([name] KWIKPEN) 100 UNIT/ML Inject 22 Units into the skin daily.   [name] losartan (COZAAR) 25 MG tablet Take 2 tablets (50 mg total) by mouth daily.   . polyethylene glycol (MIRALAX / GLYCOLAX) 17 g packet Take 17 g by mouth daily as needed for mild constipation.    . simethicone (GAS-X) 80 MG chewable tablet Chew 80 mg by mouth every 6 (six) hours as needed for flatulence.   . TRULICITY 1.5 OV/7.8HY SOPN Inject 1.5 mg into the skin once a week. 02/04/2020: On Saturdays  . [DISCONTINUED] insulin glargine (LANTUS) 100 UNIT/ML injection Inject 22 Units into the skin daily.    . Accu-Chek Softclix Lancets lancets Use to check blood sugar up to 2 times daily.   [name] acetaminophen (TYLENOL) 500 MG tablet Take 1,000 mg by mouth every 6 (six) hours as needed  for moderate pain or headache.    . Alcohol Swabs (B-D SINGLE USE SWABS REGULAR) PADS Use to check blood sugar up to 2 times daily   . allopurinol (ZYLOPRIM) 100 MG tablet Take 1 tablet (100 mg total) by mouth daily.   [name] aspirin 81 MG chewable tablet Chew 81 mg by mouth daily.   . Blood Glucose Monitoring Suppl (ACCU-CHEK AVIVA PLUS) w/Device KIT Use to check blood sugar up to 2 times daily   . Blood Glucose Monitoring Suppl (TRUE METRIX METER) DEVI Use to check blood sugar up to 2 times daily   . bosutinib (BOSULIF) 100 MG tablet Take 100 mg by mouth at bedtime as needed (tired/not feeling well). Take with food.    . gabapentin (NEURONTIN) 300 MG capsule TAKE TWO CAPSULES BY MOUTH TWICE DAILY. (MAX DOSE = 1200MG DAILY FOR KIDNEY FUNCTION)   . hydrALAZINE (APRESOLINE) 25 MG tablet Take 1 tablet (25 mg total) by mouth 3 (three) times daily.   . Insulin Pen Needle 31G X 5 MM MISC 31 g by Does not apply route as directed.   . isosorbide mononitrate (IMDUR) 60 MG 24 hr tablet Take 1 tablet (60 mg total) by mouth daily.   [name] levothyroxine (SYNTHROID) 25 MCG tablet TAKE ONE TABLET BY MOUTH EVERY MORNING BEFORE BREAKFAST   . loperamide (IMODIUM A-D) 2 MG tablet Take by mouth.    . Menthol-Zinc Oxide (GOLD BOND EX) Apply 1 application topically daily as needed (muscle pain).   . metoprolol succinate (TOPROL-XL)  25 MG 24 hr tablet TAKE ONE TABLET BY MOUTH ONCE DAILY   . mirabegron ER (MYRBETRIQ) 25 MG TB24 tablet Take 25 mg by mouth daily. From Urology   . Multiple Vitamin (MULTIVITAMIN WITH MINERALS) TABS tablet Take 1 tablet by mouth daily.   . mupirocin ointment (BACTROBAN) 2 % Apply 1 application topically 2 (two) times daily. For up to 2 weeks then stop. May repeat in future as needed, can use to help prevent infection of toe.   . nitroGLYCERIN (NITROSTAT) 0.4 MG SL tablet Place 1 tablet (0.4 mg total) under the tongue every 5 (five) minutes x 3 doses as needed for chest pain.   [name] omeprazole (PRILOSEC)  20 MG capsule Take 20 mg by mouth in the morning.    [name] Place 1 drop into both eyes 3 (three) times daily as needed (dry/irritated eyes.).    [name] potassium chloride SA (KLOR-CON) 20 MEQ tablet Take 2 tablets (40 mEq total) by mouth daily.   . ranolazine (RANEXA) 500 MG 12 hr tablet Take 1 tablet (500 mg total) by mouth 2 (two) times daily.   [name] RELION TRUE METRIX TEST STRIPS test strip Use to check blood sugar up to 2 times daily   . rosuvastatin (CRESTOR) 5 MG tablet TAKE ONE TABLET BY MOUTH ONCE DAILY   . tamsulosin (FLOMAX) 0.4 MG CAPS capsule Take 1 capsule (0.4 mg total) by mouth daily after supper.   . torsemide (DEMADEX) 20 MG tablet Take 3 tablets (60 mg total) by mouth daily.   . traMADol (ULTRAM) 50 MG tablet Take 1-2 tablets (50-100 mg total) by mouth at bedtime as needed (pain.).   [name] warfarin (COUMADIN) 3 MG tablet TAKE 1 TO 1 & 1/2 TABLETS BY MOUTH ONCE DAILY AS DIRECTED BY THE COUMADIN CLINIC   . [DISCONTINUED] Alcohol Swabs 70 % PADS Use when checking blood sugar up to 3x daily    No facility-administered encounter medications on file as of 02/04/2020.    Goals Addressed            This Visit's Progress   . PharmD - Medication Managment       CARE PLAN ENTRY (see longitudinal plan of care for additional care plan information)  Current Barriers:  . Chronic Disease Management support, education, and care coordination needs related to type 2 diabetes, CKD, HFrEF s/p CardioMEMS, hyperlipidemia, atrial fibrillation, hypertension, coronary artery disease, CHF, hypothyroidism, and CML . Financial o Note patient has Partial Extra Help/LIS Subsidy o Patient enrolled in [employer] health practitioner and [employer]) and [employer] [place]) patient assistance programs for 2021 calendar year . Limited vision  Pharmacist Clinical Goal(s):  [name] Over the next 30 days, patient will work with CM Pharmacist to address needs related to medication  adherence and diabetes management  Interventions: . Collaborated with PCP . Perform chart review ? Patient seen for Office Visit with [facility] Failure Clinic on 11/17. ? Provider advised patient to increase losartan dose to 50 mg daily ? Referral placed to Neurology ? Patient seen for Office Visit with PCP on 11/9. Provider advised patient to: ? Start Ensure / Boost / Glucerna meal supplement 1-2 times a day between meals or during meal. ? Start new Trulicity 7.5ZW weekly ? REDUCE [name] insulin from 28 down to 24 units then further down by about 1-2 units at a time, if fasting sugar is average < 170. ? Start Miralax 1 cap every day, and if need every few days can  take 2.5 cap to help have BM. ? Gas-X OTC as needed if bloating. . Outreach to patient/wife at 10:45 am. Mrs. Harron reports they are about to leave home for appointment with PCP for problem with patient's toe. . Follow up with patient's wife in afternoon as requested. ? From review of chart, note patient seen by PCP today and advised to start mupirocin ointment twice daily for affected toe ? Mrs. Dentler confirms will pick up this Rx to start today as directed . Follow up with patient's wife today regarding patient's diabetes medication management and monitoring ? Reports patient taking: ? [name] 24 untis daily ? Trulicity 1.5 mg once weekly on Saturdays (Increased dose on 11/13) ? Jardiance 10 mg once daily before breakfast ? Denies missed doses ? Patient reports recent AM CBGs readings  AM Blood Sugar  13 - November 142  14 - November 110  15 - November 155  16 - November 133  17 - November 162  18 - November 133  19 - November 131   ? Review with patient's wife/caregiver instruction from PCP to reduced [name] dose down by about 1-2 units at a time, if fasting sugar is average < 170. ? Mrs. kali ambler understanding of these directions and confirms will accordingly reduce patient's [name] dose from 24  units to 22 units daily at this time ? Reports patient's appetite is about the same as prior to dose increase of Trulicity. Reports patient has started drinking Ensure supplement as recommended by PCP . Follow up regarding medication adherence ? Mrs. Sermeno confirms increased patient's losartan dose as directed by [name] to losartan 25 mg - 2 tablets (50 mg total) daily ? Reports patient taking Miralax and Gas-X as directed by PCP and seem to be helping with gas/constipation . Continue to collaborate with [facility] to assist patient with re-enrollment for 2022 calendar year for patient assistance for Jardiance through [employer] and for [place] through [employer] ? Mrs. Nielson confirms mailed applications back to [place] . Spouse denies further medication questions/concerns on behalf of patient.  Myles Rosenthal to follow up with office for new/worsening medical concerns  Patient Self Care Activities:  . Check blood sugar, blood pressure and daily weight as directed by providers  . Patient takes medications, with assistance of wife, as directed o Mrs. Jalbert manages patient's medications using weekly pillbox as adherence tool . Patient attends scheduled medical appointments  Please see past updates related to this goal by clicking on the "Past Updates" button in the selected goal         Plan  Telephone follow up appointment with care management team member scheduled for: 12/8 at 2:30 pm  [name], PharmD, [place] [phone]

## 2020-02-04 NOTE — Patient Instructions (Addendum)
Thank you for coming to the office today.  Previous saw Dr Shana Ch[facility] 09/2019 [facility]) - if you can call them to schedule an appointment with new provider at that location they should be able to help you as this is something Dr Elvina Mattes was treating you for.  [address], [place] [id number]  [phone]   Use topical Bactroban Mupirocin antibiotic ointment apply twice a day to 2nd (Right) toe for 2 weeks then can stop and use again if bothering you.  Please schedule a Follow-up Appointment to: Return if symptoms worsen or fail to improve.  If you have any other questions or concerns, please feel free to call the office or send a message through [place]. You may also schedule an earlier appointment if necessary.  Additionally, you may be receiving a survey about your experience at our office within a few days to 1 week by e-mail or mail. We value your feedback.  [name], DO [place]

## 2020-02-07 ENCOUNTER — Telehealth (HOSPITAL_COMMUNITY): Payer: Self-pay | Admitting: Licensed Clinical Social Worker

## 2020-02-07 NOTE — Telephone Encounter (Signed)
CSW consulted by Pharmacy Advocate to assist with pt applying for Extra Help program.  CSW called pt and spoke with him and his wife regarding Extra Help program and [employer] requirement for them to apply prior to assisting with Jardiance.  Application for Extra Help completed over the phone- informed pt and wife they should get a letter in 3-4 weeks informing them of approval or denial and asking them to call me back once this letter is received- pt and wife expressed understanding  Will continue to follow and assist as needed  Jorge Ny, [place] Desk#: [phone] Cell#: [phone]

## 2020-02-08 ENCOUNTER — Ambulatory Visit (INDEPENDENT_AMBULATORY_CARE_PROVIDER_SITE_OTHER): Payer: Medicare HMO | Admitting: Physician Assistant

## 2020-02-08 ENCOUNTER — Other Ambulatory Visit: Payer: Self-pay

## 2020-02-08 ENCOUNTER — Encounter: Payer: Self-pay | Admitting: Physician Assistant

## 2020-02-08 VITALS — BP 127/76 | HR 91 | Ht 64.0 in | Wt 204.0 lb

## 2020-02-08 DIAGNOSIS — N3941 Urge incontinence: Secondary | ICD-10-CM | POA: Diagnosis not present

## 2020-02-08 LAB — BLADDER SCAN AMB NON-IMAGING

## 2020-02-08 MED ORDER — MIRABEGRON ER 25 MG PO TB24: 25.0000 mg | ORAL_TABLET | Freq: Every day | ORAL | 1 refills | Status: DC

## 2020-02-08 NOTE — Progress Notes (Signed)
02/08/2020 3:49 PM   Lucas Caldwell 05-Nov-1933 [id number]  CC: Chief Complaint  Patient presents with  . Follow-up    HPI: Lucas Caldwell is a 86 y.o. male most PMH BPH with LUTS on Flomax and urge incontinence who presents today for symptom recheck on Myrbetriq 25 mg daily.  [name] accompanied today by his wife, who contributes to HPI.  Today patient reports having completed Myrbetriq samples last week.  He continues to take Flomax.  He reports slight improvement in his urinary urgency and urge incontinence but is unable to quantify this.  He remains bothered by his urinary symptoms.  PVR 7 mL.  Patient reports taking multiple medications and is hesitant to add new medications to his regimen.    PMH: Past Medical History:  Diagnosis Date  . Arthritis    "probably in his legs" (08/15/2016)  . Blind right eye   . BPH (benign prostatic hyperplasia)   . Breast asymmetry    Left breast is larger, present for several years.  [name] CAD (coronary artery disease)    a. Cath in the late 90's - reportedly ok;  b. 2014 s/p stenting x 2 @ [facility]; c 08/19/16 Cath/PCI with DES -> RCA, plan to treat LM 70% medically. Seen by surgery and felt to be too high risk for CABG; d. 08/2018 Cath: LM 70 (iFR 0.86), LAD 20ost, 40p, 50/33m D1 70, LCX patent stent, OM1 30, RCA patent stent, 481mSR, 40d, RPDA 30. PCWP 8. CO/CI 4.0/2.1; e. 03/2019 PCI to LAD (2.5x15 Resolute Onyx DES).  . Chronic combined systolic (congestive) and diastolic (congestive) heart failure (HCCenter   a. Previously reduced EF-->50% by echo in 2012;  b. 06/2015 Echo: EF 50-55%  c. 07/2016 Echo: EF 45-50%; d. 12/2017 Echo: EF 55%, Gr1 DD, mild MR; e. 08/2018 Echo: EF 35-40%, mildly dil PA; f. 04/2019 Echo: EF 30-35%, glob HK, nl RV fxn; g. s/p Cardiomems.  . Chronic kidney disease (CKD), stage IV (severe) (HCMarked [place]  . CML (chronic myelocytic leukemia) ([place]  . GERD (gastroesophageal reflux disease)   . GIB (gastrointestinal bleeding)    a.  12/2017 3 unit PRBC GIB in setting of coumadin-->Endo/colon multiple duodenal ulcers and a single bleeding ulcer in the proximal ascending colon status post hemostatic clipping x2.  . Gout   . Hyperlipemia   . Hypertension   . Hypothyroidism   . Iron deficiency anemia   . Ischemic cardiomyopathy    a. Previously reduced EF-->50% by echo in 2012;  b. 06/2015 Echo: EF 50-55%, Gr2 DD, mild MR, mildly dil LA, Ao sclerosis, mild TR;  c. 07/2016 Echo: EF 45-50%; d. 12/2017 Echo: EF 55%, Gr1 DD, mild MR; e. 08/2018 Echo: EF 35-40%; f. 04/2019 Echo: EF 30-35%.  . Migraine    "in the 1960s" (08/15/2016)  . PAF (paroxysmal atrial fibrillation) (HCC)    a. ? Dx 2014-->s/p DCCV;  b. CHA2DS2VASc = 6--> Coumadin.  . Prostate cancer (HCCochise  . RBBB   . Type II diabetes mellitus (HCNatchitoches    Surgical History: Past Surgical History:  Procedure Laterality Date  . CATARACT EXTRACTION W/ INTRAOCULAR LENS  IMPLANT, BILATERAL Bilateral   . COLONOSCOPY N/A 01/13/2018   Procedure: COLONOSCOPY;  Surgeon: VaLin LandsmanMD;  [facility];  Service: Gastroenterology;  Laterality: N/A;  . COLONOSCOPY WITH PROPOFOL N/A 01/01/2018   Procedure: COLONOSCOPY WITH PROPOFOL;  Surgeon: VaLin LandsmanMD;  [facility];  Service: Gastroenterology;  Laterality: N/A;  . CORONARY ANGIOPLASTY WITH STENT PLACEMENT  09/2012   2 stents  . CORONARY STENT INTERVENTION N/A 08/19/2016   Procedure: Coronary Stent Intervention;  Surgeon: [name], MD;  Location: [place] CV LAB;  Service: Cardiovascular;  Laterality: N/A;  . CORONARY STENT INTERVENTION N/A 04/13/2019   Procedure: CORONARY STENT INTERVENTION;  Surgeon: [name], MD;  Location: [place] CV LAB;  Service: Cardiovascular;  Laterality: N/A;  . ESOPHAGEAL MANOMETRY N/A 12/16/2017   Procedure: ESOPHAGEAL MANOMETRY (EM);  Surgeon: Lin Landsman, MD;  Location: [facility] ENDOSCOPY;  Service: Gastroenterology;  Laterality: N/A;    . ESOPHAGOGASTRODUODENOSCOPY N/A 12/20/2016   Procedure: ESOPHAGOGASTRODUODENOSCOPY (EGD);  Surgeon: Lin Landsman, MD;  [facility];  Service: Gastroenterology;  Laterality: N/A;  . ESOPHAGOGASTRODUODENOSCOPY N/A 01/13/2018   Procedure: ESOPHAGOGASTRODUODENOSCOPY (EGD);  Surgeon: Lin Landsman, MD;  Location[facility];  Service: Gastroenterology;  Laterality: N/A;  . ESOPHAGOGASTRODUODENOSCOPY (EGD) WITH PROPOFOL N/A 11/03/2017   Procedure: ESOPHAGOGASTRODUODENOSCOPY (EGD) WITH PROPOFOL;  Surgeon: Lin Landsman, MD;  Location[facility] END[facility];  Service: Gastroenterology;  Laterality: N/A;  . EYE SURGERY    . HERNIA REPAIR     "[place]"  . LEFT HEART CATH AND CORONARY ANGIOGRAPHY N/A 08/14/2016   Procedure: Left Heart Cath and Coronary Angiography;  Surgeon: [name], MD;  Location: [place] CV LAB;  Service: Cardiovascular;  Laterality: N/A;  . PRESSURE SENSOR/CARDIOMEMS N/A 09/23/2019   Procedure: PRESSURE SENSOR/CARDIOMEMS;  Surgeon: Jolaine Artist, MD;  Location: [place] CV LAB;  Service: Cardiovascular;  Laterality: N/A;  . RIGHT HEART CATH N/A 09/23/2019   Procedure: RIGHT HEART CATH;  Surgeon: Jolaine Artist, MD;  Location: [place] CV LAB;  Service: Cardiovascular;  Laterality: N/A;  . RIGHT/LEFT HEART CATH AND CORONARY ANGIOGRAPHY N/A 09/01/2018   Procedure: RIGHT/LEFT HEART CATH AND CORONARY ANGIOGRAPHY;  Surgeon: [name], MD;  Location: [place] CV LAB;  Service: Cardiovascular;  Laterality: N/A;  . RIGHT/LEFT HEART CATH AND CORONARY ANGIOGRAPHY N/A 04/13/2019   Procedure: RIGHT/LEFT HEART CATH AND CORONARY ANGIOGRAPHY;  Surgeon: [name], MD;  Location: [place] CV LAB;  Service: Cardiovascular;  Laterality: N/A;  . TOE AMPUTATION Right    "big toe"    Home Medications:  Allergies as of 02/08/2020      Reactions   Clopidogrel Anaphylaxis, Other (See Comments)   altered mental status;   (electrolytes "out of wack" and confusion per family; THEY DO NOT REMEMBER ANY OTHER REACTION)- MSB 10/10/15   Ciprofloxacin Itching   Mirtazapine Diarrhea   Bad dreams   Spironolactone Other (See Comments)   gynecomastia      Medication List       Accurate as of February 08, 2020  3:49 PM. If you have any questions, ask your nurse or doctor.        Accu-Chek Aviva Plus w/Device Kit Use to check blood sugar up to 2 times daily   True Metrix Meter Devi Use to check blood sugar up to 2 times daily   Accu-Chek Softclix Lancets lancets Use to check blood sugar up to 2 times daily.   acetaminophen 500 MG tablet Commonly known as: TYLENOL Take 1,000 mg by mouth every 6 (six) hours as needed for moderate pain or headache.   allopurinol 100 MG tablet Commonly known as: ZYLOPRIM Take 1 tablet (100 mg total) by mouth daily.   aspirin 81 MG chewable tablet Chew 81 mg by mouth daily.   B-D SINGLE USE SWABS REGULAR  Pads Use to check blood sugar up to 2 times daily   [name] KwikPen 100 UNIT/ML Inject 22 Units into the skin daily.   bosutinib 100 MG tablet Commonly known as: BOSULIF Take 100 mg by mouth at bedtime as needed (tired/not feeling well). Take with food.   empagliflozin 10 MG Tabs tablet Commonly known as: JARDIANCE Take 10 mg by mouth daily before breakfast.   gabapentin 300 MG capsule Commonly known as: NEURONTIN TAKE TWO CAPSULES BY MOUTH TWICE DAILY. (MAX DOSE = 1200MG DAILY FOR KIDNEY FUNCTION)   Gas-X 80 MG chewable tablet Generic drug: simethicone Chew 80 mg by mouth every 6 (six) hours as needed for flatulence.   GOLD BOND EX Apply 1 application topically daily as needed (muscle pain).   hydrALAZINE 25 MG tablet Commonly known as: APRESOLINE Take 1 tablet (25 mg total) by mouth 3 (three) times daily.   Insulin Pen Needle 31G X 5 MM Misc 31 g by Does not apply route as directed.   isosorbide mononitrate 60 MG 24 hr tablet Commonly known as:  IMDUR Take 1 tablet (60 mg total) by mouth daily.   levothyroxine 25 MCG tablet Commonly known as: SYNTHROID TAKE ONE TABLET BY MOUTH EVERY MORNING BEFORE BREAKFAST   loperamide 2 MG tablet Commonly known as: IMODIUM A-D Take by mouth.   losartan 25 MG tablet Commonly known as: COZAAR Take 2 tablets (50 mg total) by mouth daily.   metoprolol succinate 25 MG 24 hr tablet Commonly known as: TOPROL-XL TAKE ONE TABLET BY MOUTH ONCE DAILY   mirabegron ER 25 MG Tb24 tablet Commonly known as: Myrbetriq Take 1 tablet (25 mg total) by mouth daily. From Urology   multivitamin with minerals Tabs tablet Take 1 tablet by mouth daily.   mupirocin ointment 2 % Commonly known as: BACTROBAN Apply 1 application topically 2 (two) times daily. For up to 2 weeks then stop. May repeat in future as needed, can use to help prevent infection of toe.   nitroGLYCERIN 0.4 MG SL tablet Commonly known as: NITROSTAT Place 1 tablet (0.4 mg total) under the tongue every 5 (five) minutes x 3 doses as needed for chest pain.   omeprazole 20 MG capsule Commonly known as: PRILOSEC Take 20 mg by mouth in the morning.   polyethylene glycol 17 g packet Commonly known as: MIRALAX / GLYCOLAX Take 17 g by mouth daily as needed for mild constipation.   potassium chloride SA 20 MEQ tablet Commonly known as: KLOR-CON Take 2 tablets (40 mEq total) by mouth daily.   ranolazine 500 MG 12 hr tablet Commonly known as: RANEXA Take 1 tablet (500 mg total) by mouth 2 (two) times daily.   ReliOn True Metrix Test Strips test strip Generic drug: glucose blood Use to check blood sugar up to 2 times daily   rosuvastatin 5 MG tablet Commonly known as: CRESTOR TAKE ONE TABLET BY MOUTH ONCE DAILY   SYSTANE OP Place 1 drop into both eyes 3 (three) times daily as needed (dry/irritated eyes.).   tamsulosin 0.4 MG Caps capsule Commonly known as: FLOMAX Take 1 capsule (0.4 mg total) by mouth daily after supper.     torsemide 20 MG tablet Commonly known as: DEMADEX Take 3 tablets (60 mg total) by mouth daily.   traMADol 50 MG tablet Commonly known as: ULTRAM Take 1-2 tablets (50-100 mg total) by mouth at bedtime as needed (pain.).   Trulicity 1.5 LD/3.5TS Sopn Generic drug: Dulaglutide Inject 1.5 mg into the skin once  a week.   warfarin 3 MG tablet Commonly known as: COUMADIN Take as directed by the anticoagulation clinic. If you are unsure how to take this medication, talk to your nurse or doctor. Original instructions: TAKE 1 TO 1 & 1/2 TABLETS BY MOUTH ONCE DAILY AS DIRECTED BY THE COUMADIN CLINIC       Allergies:  Allergies  Allergen Reactions  . Clopidogrel Anaphylaxis and Other (See Comments)    altered mental status;  (electrolytes "out of wack" and confusion per family; THEY DO NOT [place])- MSB 10/10/15  . Ciprofloxacin Itching  . Mirtazapine Diarrhea    Bad dreams   . Spironolactone Other (See Comments)    gynecomastia    Family History: Family History  Problem Relation Age of Onset  . Brain cancer Father   . Diabetes Sister   . Heart attack Sister     Social History:   reports that he quit smoking about 46 years ago. His smoking use included cigarettes. He started smoking about 66 years ago. He has a 35.00 pack-year smoking history. He has quit using smokeless tobacco. He reports that he does not drink alcohol and does not use drugs.  Physical Exam: BP 127/76 (BP Location: Left Arm, Patient Position: Sitting, Cuff Size: Normal)   Pulse 91   Ht '5\' 4"'  (1.626 m)   Wt 204 lb (92.5 kg)   BMI 35.02 kg/m   Constitutional:  Alert and oriented, no acute distress, nontoxic appearing HEENT: [place], AT Cardiovascular: No clubbing, cyanosis, or edema Respiratory: Normal respiratory effort, no increased work of breathing Skin: No rashes, bruises or suspicious lesions Neurologic: Grossly intact, no focal deficits, moving all 4 extremities Psychiatric: Normal  mood and affect  Laboratory Data: Results for orders placed or performed in visit on 02/08/20  Bladder Scan (Post Void Residual) in office  Result Value Ref Range   Scan Result 58m    Assessment & Plan:   1. Urge incontinence of urine Likely secondary to underlying BPH, PVR WNL..[name] I offered the patient various treatment options at this point including continued trial of Myrbetriq with plans for symptom recheck after 12 weeks of continuous therapy versus trial of an alternative OAB med versus addition of finasteride.  Patient prefers to continue Myrbetriq with plans for symptom recheck in 2 months.  I am in agreement with this plan. - Bladder Scan (Post Void Residual) in office - mirabegron ER (MYRBETRIQ) 25 MG TB24 tablet; Take 1 tablet (25 mg total) by mouth daily. From Urology  Dispense: 30 tablet; Refill: 1  Return in about 2 months (around 04/09/2020) for Symptom recheck with PVR.  SDebroah Loop PA-C  [facility] [address] [place] [phone]

## 2020-02-15 ENCOUNTER — Telehealth (HOSPITAL_COMMUNITY): Payer: Self-pay

## 2020-02-15 ENCOUNTER — Telehealth (HOSPITAL_COMMUNITY): Payer: Self-pay | Admitting: Internal Medicine

## 2020-02-15 NOTE — Telephone Encounter (Signed)
Malena Edman, RN  02/15/2020 1:21 PM EST Back to Top    Tried calling patient, no answer, unable to leave message. Letter Mailed to address on file   Malena Edman, RN  02/14/2020 12:27 PM EST     Tried calling patient, no answer, unable to leave message. Will try again later   Malena Edman, RN  02/04/2020 9:53 AM EST     Tried to reach out to patient, patient answered phone and then ended call. Will try again later

## 2020-02-15 NOTE — Telephone Encounter (Signed)
Pt wife called she would like the sleep study to be scheduled in [facility] 12/15 instead [facility], pt can be reached [phone], or [phone]. Please advise

## 2020-02-15 NOTE — Telephone Encounter (Signed)
-----   Message from Jolaine Artist, MD sent at 02/04/2020  9:10 AM EST ----- Hemolyzed. Repeat BMET in [facility] next week

## 2020-02-16 ENCOUNTER — Ambulatory Visit (INDEPENDENT_AMBULATORY_CARE_PROVIDER_SITE_OTHER): Payer: Medicare HMO

## 2020-02-16 ENCOUNTER — Other Ambulatory Visit: Payer: Self-pay

## 2020-02-16 DIAGNOSIS — Z5181 Encounter for therapeutic drug level monitoring: Secondary | ICD-10-CM | POA: Diagnosis not present

## 2020-02-16 DIAGNOSIS — I480 Paroxysmal atrial fibrillation: Secondary | ICD-10-CM | POA: Diagnosis not present

## 2020-02-16 DIAGNOSIS — Z7901 Long term (current) use of anticoagulants: Secondary | ICD-10-CM | POA: Diagnosis not present

## 2020-02-16 LAB — POCT INR: INR: 2.7 (ref 2.0–3.0)

## 2020-02-16 NOTE — Patient Instructions (Signed)
-   continue warfarin of 1 TABLET EVERY DAY EXCEPT 1.5 TABLETS ON MONDAYS, [place]. - recheck INR in 6 weeks.

## 2020-02-16 NOTE — Progress Notes (Signed)
Assessment/Plan:   1.   Essential Tremor.  -This is evidenced by the symmetrical nature and longstanding hx of gradually getting worse.  We discussed nature and pathophysiology.  We discussed that this can continue to gradually get worse with time.  We discussed that some medications can worsen this, as can caffeine use.  We discussed medication therapy as well as surgical therapy.  Ultimately, the patient decided to try primidone.  discussed interaction with coumadin/ranexa.  He has an appointment for INR check in a month but I will need to check with Dr. Saunders Revel to see if okay with Ranexa.  Will send him a message.  2.  Peripheral neuropathy  -The patient has clinical examination evidence of a diffuse peripheral neuropathy, which certainly can affect gait and balance.  This is likely due to DM and he is treated and followed by his PCP and podiatry for this.  We discussed safety associated with peripheral neuropathy.  We discussed balance therapy and the importance of ambulatory assistive device for balance assistance.  [name] already using this.  Gait also appears affected by degenerative changes in knee.    Subjective:   Lucas Caldwell was seen today in the movement disorders clinic for neurologic consultation at the request of Bensimhon, Shaune Pascal, MD.  The consultation is for the evaluation of tremor.  Prior records made available to me are reviewed.  [name] present and supplements history.  Tremor: Yes.     How long has it been going on? 2 years  At rest or with activation?  activation  When is it noted the most?  Eating, writing  Fam hx of tremor?  No.  Located where?  Bilateral UE - unsure where it started but think it started in one hand.  They shake equally now.  [name] R hand dominant  Affected by caffeine:  Doesn't drink enough to know  Affected by alcohol:  Doesn't drink any  Affected by stress:  Yes.    Affected by fatigue:  Yes.    Spills soup if on spoon:  May or may  not  Spills glass of liquid if full:  No.  Affects ADL's (tying shoes, brushing teeth, etc):  No.  - still shaving with a blade  Tremor inducing meds:  No.   -tremor improving meds: gabapentin (on for PN)  Other Specific Symptoms:  Voice: no change Sleep: sleeps well Postural symptoms:  Yes.  , attributes to diabetic PN  Falls?  No.  - uses cane x  2 years Bradykinesia symptoms: shuffling gait and slow movements Loss of smell:  No. Loss of taste:  No. Urinary Incontinence:  Yes.   Difficulty Swallowing:  Yes.   Memory changes:  Yes.   , some short term trouble N/V:  No. Lightheaded:  Yes.   , followed bycardiology.  Not felt to be volume induced Diplopia:  Yes.   occasionally   Neuroimaging of the brain has not previously been performed.     ALLERGIES:   Allergies  Allergen Reactions  . Clopidogrel Anaphylaxis and Other (See Comments)    altered mental status;  (electrolytes "out of wack" and confusion per family; THEY DO NOT [place])- MSB 10/10/15  . Ciprofloxacin Itching  . Mirtazapine Diarrhea    Bad dreams   . Spironolactone Other (See Comments)    gynecomastia    CURRENT MEDICATIONS:  Current Outpatient Medications  Medication Instructions  . Accu-Chek Softclix Lancets lancets Use to check  blood sugar up to 2 times daily.  [name] acetaminophen (TYLENOL) 1,000 mg, Oral, Every 6 hours PRN  . Alcohol Swabs (B-D SINGLE USE SWABS REGULAR) PADS Use to check blood sugar up to 2 times daily  . allopurinol (ZYLOPRIM) 100 mg, Oral, Daily  . aspirin 81 mg, Oral, Daily  . [name] KwikPen 22 Units, Subcutaneous, Daily  . Blood Glucose Monitoring Suppl (ACCU-CHEK AVIVA PLUS) w/Device KIT Use to check blood sugar up to 2 times daily  . Blood Glucose Monitoring Suppl (TRUE METRIX METER) DEVI Use to check blood sugar up to 2 times daily  . bosutinib (BOSULIF) 100 mg, Oral, At bedtime PRN, Take with food.  . empagliflozin (JARDIANCE) 10 mg, Oral, Daily before  breakfast  . gabapentin (NEURONTIN) 300 MG capsule TAKE TWO CAPSULES BY MOUTH TWICE DAILY. (MAX DOSE = 1200MG DAILY FOR KIDNEY FUNCTION)  . hydrALAZINE (APRESOLINE) 25 mg, Oral, 3 times daily  . Insulin Pen Needle 31 g, Does not apply, As directed  . isosorbide mononitrate (IMDUR) 60 mg, Oral, Daily  . levothyroxine (SYNTHROID) 25 MCG tablet TAKE ONE TABLET BY MOUTH EVERY MORNING BEFORE BREAKFAST  . loperamide (IMODIUM A-D) 2 MG tablet Oral  . losartan (COZAAR) 50 mg, Oral, Daily  . Menthol-Zinc Oxide (GOLD BOND EX) 1 application, Apply externally, Daily PRN  . metoprolol succinate (TOPROL-XL) 25 MG 24 hr tablet TAKE ONE TABLET BY MOUTH ONCE DAILY  . mirabegron ER (MYRBETRIQ) 25 mg, Oral, Daily, From Urology   . Multiple Vitamin (MULTIVITAMIN WITH MINERALS) TABS tablet 1 tablet, Oral, Daily  . mupirocin ointment (BACTROBAN) 2 % 1 application, Topical, 2 times daily, For up to 2 weeks then stop. May repeat in future as needed, can use to help prevent infection of toe.  . nitroGLYCERIN (NITROSTAT) 0.4 mg, Sublingual, Every 5 min x3 PRN  . omeprazole (PRILOSEC) 20 mg, Oral, Every morning  . Polyethyl Glycol-Propyl Glycol (SYSTANE OP) 1 drop, Both Eyes, 3 times daily PRN  . polyethylene glycol (MIRALAX / GLYCOLAX) 17 g, Oral, Daily PRN  . potassium chloride SA (KLOR-CON) 20 MEQ tablet 40 mEq, Oral, Daily  . ranolazine (RANEXA) 500 mg, Oral, 2 times daily  . RELION TRUE METRIX TEST STRIPS test strip Use to check blood sugar up to 2 times daily  . rosuvastatin (CRESTOR) 5 MG tablet TAKE ONE TABLET BY MOUTH ONCE DAILY  . simethicone (GAS-X) 80 mg, Oral, Every 6 hours PRN  . tamsulosin (FLOMAX) 0.4 mg, Oral, Daily after supper  . torsemide (DEMADEX) 60 mg, Oral, Daily  . traMADol (ULTRAM) 50-100 mg, Oral, At bedtime PRN  . Trulicity 1.5 mg, Subcutaneous, Weekly  . warfarin (COUMADIN) 3 MG tablet TAKE 1 TO 1 & 1/2 TABLETS BY MOUTH ONCE DAILY AS DIRECTED BY THE COUMADIN CLINIC    Objective:    PHYSICAL EXAMINATION:    VITALS:   Vitals:   02/17/20 0928  BP: 109/68  Pulse: 92  SpO2: 97%  Weight: 203 lb (92.1 kg)  Height: '5\' 5"'  (1.651 m)    GEN:  The patient appears stated age and is in NAD. HEENT:  Normocephalic, atraumatic.  The mucous membranes are moist. The superficial temporal arteries are without ropiness or tenderness. CV:  RRR Lungs:  CTAB Neck/HEME:  There are no carotid bruits bilaterally.  Neurological examination:  Orientation: The patient is alert and oriented x3.  Cranial nerves: There is good facial symmetry.  Extraocular muscles are intact.  The right eye is opaque.  The visual fields are full  to confrontational testing. The speech is fluent and clear. Soft palate rises symmetrically and there is no tongue deviation. Hearing is intact to conversational tone. Sensation: Sensation is intact to light touch throughout (facial, trunk, extremities).  stimulation.  Motor: Strength is 5/5 in the bilateral upper and lower extremities, with the exception of limited ability to abduct the left arm due to shoulder/rotator cuff issue.   Shoulder shrug is equal and symmetric.  There is no pronator drift.   Movement examination: Tone: There is normal tone in the bilateral upper extremities.  The tone in the lower extremities is normal.  Abnormal movements: There is no rest tremor.  There is postural tremor that is mild, but becomes mild to moderate with intention bilaterally.  He has trouble with Archimedes spirals bilaterally.  He has just mild trouble pouring water from 1 glass to another.  He does not really spill it. Coordination:  There is no decremation with RAM's Gait and Station: The patient pushes off of the chair to arise.  He ambulates with his cane.  He has an antalgic gait.  [name] wide-based.   I have reviewed and interpreted the following labs independently   Chemistry      Component Value Date/Time   NA 139 02/03/2020 0846   NA 139 11/24/2019 1424   NA  134 (L) 05/18/2012 0440   K 5.8 (H) 02/03/2020 0846   K 4.2 05/18/2012 0440   CL 97 (L) 02/03/2020 0846   CL 103 05/18/2012 0440   CO2 21 (L) 02/03/2020 0846   CO2 25 05/18/2012 0440   BUN 22 02/03/2020 0846   BUN 31 (H) 11/24/2019 1424   BUN 38 (H) 05/18/2012 0440   CREATININE 1.72 (H) 02/03/2020 0846   CREATININE 1.26 05/18/2012 0440      Component Value Date/Time   CALCIUM 9.5 02/03/2020 0846   CALCIUM 8.4 (L) 05/18/2012 0440   ALKPHOS 82 08/28/2018 1107   ALKPHOS 99 05/17/2012 1440   AST 23 08/28/2018 1107   AST 24 05/17/2012 1440   ALT 14 08/28/2018 1107   ALT 28 05/17/2012 1440   BILITOT 0.5 08/28/2018 1107   BILITOT <0.2 04/22/2018 1513   BILITOT 0.5 05/17/2012 1440      Lab Results  Component Value Date   TSH 3.850 05/04/2019   Lab Results  Component Value Date   WBC 6.8 11/04/2019   HGB 11.9 (L) 11/04/2019   HCT 39.9 11/04/2019   MCV 89.1 11/04/2019   PLT 223 11/04/2019      Total time spent on today's visit was 45 minutes, including both face-to-face time and nonface-to-face time.  Time included that spent on review of records (prior notes available to me/labs/imaging if pertinent), discussing treatment and goals, answering patient's questions and coordinating care.  Cc:  [name], DO

## 2020-02-17 ENCOUNTER — Ambulatory Visit: Payer: Medicare HMO | Admitting: Neurology

## 2020-02-17 ENCOUNTER — Encounter: Payer: Self-pay | Admitting: Neurology

## 2020-02-17 ENCOUNTER — Ambulatory Visit (INDEPENDENT_AMBULATORY_CARE_PROVIDER_SITE_OTHER): Payer: Medicare HMO | Admitting: Neurology

## 2020-02-17 VITALS — BP 109/68 | HR 92 | Ht 65.0 in | Wt 203.0 lb

## 2020-02-17 DIAGNOSIS — G250 Essential tremor: Secondary | ICD-10-CM

## 2020-02-17 DIAGNOSIS — E1142 Type 2 diabetes mellitus with diabetic polyneuropathy: Secondary | ICD-10-CM

## 2020-02-17 NOTE — Patient Instructions (Addendum)
I will send a message to Dr. Saunders Revel.  If he gives approval for starting primidone, you will start as follows:  Start primidone 50 mg - 1/2 tablet at bedtime for 1 week and then increase to 1 tablet at bedtime thereafter.  If tremor is not well controlled after a month, call me and we can increase it  The physicians and staff at [facility] are committed to providing excellent care. You may receive a survey requesting feedback about your experience at our office. We strive to receive "very good" responses to the survey questions. If you feel that your experience would prevent you from giving the office a "very good " response, please contact our office to try to remedy the situation. We may be reached at [phone]. Thank you for taking the time out of your busy day to complete the survey.

## 2020-02-18 ENCOUNTER — Telehealth: Payer: Self-pay | Admitting: Neurology

## 2020-02-18 NOTE — Telephone Encounter (Signed)
Let pt know that I talked with Dr. Saunders Revel.  While he was willing to d/c ranexa to start primidone, I don't think that the benefits of doing that outweigh the risks (especially since there is also interaction with his CA meds).  Right now, I would recommend that he not take meds for tremor because of interactions with his other meds.  We can readdress in the future if we need to and tremor is unmanageable.

## 2020-02-18 NOTE — Telephone Encounter (Signed)
Spoke with patients spouse and gave her Dr Tat's recommendations. She voiced understanding.  

## 2020-02-21 ENCOUNTER — Telehealth (HOSPITAL_COMMUNITY): Payer: Self-pay | Admitting: Internal Medicine

## 2020-02-21 ENCOUNTER — Other Ambulatory Visit
Admission: RE | Admit: 2020-02-21 | Discharge: 2020-02-21 | Disposition: A | Discharge status: Home / Self Care | Payer: Medicare HMO | Attending: Internal Medicine | Admitting: Internal Medicine

## 2020-02-21 DIAGNOSIS — M2042 Other hammer toe(s) (acquired), left foot: Secondary | ICD-10-CM | POA: Diagnosis not present

## 2020-02-21 DIAGNOSIS — I5022 Chronic systolic (congestive) heart failure: Secondary | ICD-10-CM | POA: Insufficient documentation

## 2020-02-21 DIAGNOSIS — M216X2 Other acquired deformities of left foot: Secondary | ICD-10-CM | POA: Diagnosis not present

## 2020-02-21 DIAGNOSIS — E1142 Type 2 diabetes mellitus with diabetic polyneuropathy: Secondary | ICD-10-CM | POA: Diagnosis not present

## 2020-02-21 DIAGNOSIS — E11621 Type 2 diabetes mellitus with foot ulcer: Secondary | ICD-10-CM | POA: Diagnosis not present

## 2020-02-21 DIAGNOSIS — E08621 Diabetes mellitus due to underlying condition with foot ulcer: Secondary | ICD-10-CM | POA: Diagnosis not present

## 2020-02-21 DIAGNOSIS — M216X1 Other acquired deformities of right foot: Secondary | ICD-10-CM | POA: Diagnosis not present

## 2020-02-21 DIAGNOSIS — M2041 Other hammer toe(s) (acquired), right foot: Secondary | ICD-10-CM | POA: Diagnosis not present

## 2020-02-21 DIAGNOSIS — M792 Neuralgia and neuritis, unspecified: Secondary | ICD-10-CM | POA: Diagnosis not present

## 2020-02-21 DIAGNOSIS — L97512 Non-pressure chronic ulcer of other part of right foot with fat layer exposed: Secondary | ICD-10-CM | POA: Diagnosis not present

## 2020-02-21 DIAGNOSIS — Z89411 Acquired absence of right great toe: Secondary | ICD-10-CM | POA: Diagnosis not present

## 2020-02-21 DIAGNOSIS — B351 Tinea unguium: Secondary | ICD-10-CM | POA: Diagnosis not present

## 2020-02-21 LAB — BASIC METABOLIC PANEL
Anion gap: 12 (ref 5–15)
BUN: 23 mg/dL (ref 8–23)
CO2: 24 mmol/L (ref 22–32)
Calcium: 9.3 mg/dL (ref 8.9–10.3)
Chloride: 99 mmol/L (ref 98–111)
Creatinine, Ser: 1.6 mg/dL — ABNORMAL HIGH (ref 0.61–1.24)
GFR, Estimated: 42 mL/min — ABNORMAL LOW (ref >60)
Glucose, Bld: 168 mg/dL — ABNORMAL HIGH (ref 70–99)
Potassium: 3.9 mmol/L (ref 3.5–5.1)
Sodium: 135 mmol/L (ref 135–145)

## 2020-02-21 NOTE — Telephone Encounter (Signed)
Pt received a letter in mail regarding labs, stating it was not processed, please call wife [phone]

## 2020-02-21 NOTE — Telephone Encounter (Signed)
Spoke with patients wife she is aware and agreeable with plan.   Per Dr.Bensimhon lab hemolyzed need repeat at [facility].

## 2020-02-23 ENCOUNTER — Ambulatory Visit: Payer: Medicare HMO | Admitting: Pharmacist

## 2020-02-23 DIAGNOSIS — E1121 Type 2 diabetes mellitus with diabetic nephropathy: Secondary | ICD-10-CM

## 2020-02-23 DIAGNOSIS — Z794 Long term (current) use of insulin: Secondary | ICD-10-CM

## 2020-02-23 NOTE — Chronic Care Management (AMB) (Signed)
Chronic Care Management   Follow Up Note   02/23/2020 Name: Lucas Caldwell MRN: [id number] DOB: [birth year]  Referred by: Lucas Hauser, DO Reason for referral : Chronic Care Management (Patient Phone Call)   Lucas Caldwell is a 86 y.o. year old male who is a primary care patient of Lucas Hauser, DO. The CCM team was consulted for assistance with chronic disease management and care coordination needs.    I reached out to Lucas Caldwell and his wife by phone today.   Review of patient status, including review of consultants reports, relevant laboratory and other test results, and collaboration with appropriate care team members and the patient's provider was performed as part of comprehensive patient evaluation and provision of chronic care management services.    SDOH (Social Determinants of Health) assessments performed: No See Care Plan activities for detailed interventions related to [facility])     Outpatient Encounter Medications as of 02/23/2020  Medication Sig Note  . Accu-Chek Softclix Lancets lancets Use to check blood sugar up to 2 times daily.   [name] acetaminophen (TYLENOL) 500 MG tablet Take 1,000 mg by mouth every 6 (six) hours as needed for moderate pain or headache.    . Alcohol Swabs (B-D SINGLE USE SWABS REGULAR) PADS Use to check blood sugar up to 2 times daily   . allopurinol (ZYLOPRIM) 100 MG tablet Take 1 tablet (100 mg total) by mouth daily.   [name] aspirin 81 MG chewable tablet Chew 81 mg by mouth daily.   . Blood Glucose Monitoring Suppl (ACCU-CHEK AVIVA PLUS) w/Device KIT Use to check blood sugar up to 2 times daily   . Blood Glucose Monitoring Suppl (TRUE METRIX METER) DEVI Use to check blood sugar up to 2 times daily   . bosutinib (BOSULIF) 100 MG tablet Take 100 mg by mouth at bedtime as needed (tired/not feeling well). Take with food.    . empagliflozin (JARDIANCE) 10 MG TABS tablet Take 10 mg by mouth daily before breakfast.   .  gabapentin (NEURONTIN) 300 MG capsule TAKE TWO CAPSULES BY MOUTH TWICE DAILY. (MAX DOSE = 1200MG DAILY FOR KIDNEY FUNCTION)   . hydrALAZINE (APRESOLINE) 25 MG tablet Take 1 tablet (25 mg total) by mouth 3 (three) times daily.   . Insulin Glargine ([name] KWIKPEN) 100 UNIT/ML Inject 22 Units into the skin daily.   . Insulin Pen Needle 31G X 5 MM MISC 31 g by Does not apply route as directed.   . isosorbide mononitrate (IMDUR) 60 MG 24 hr tablet Take 1 tablet (60 mg total) by mouth daily.   [name] levothyroxine (SYNTHROID) 25 MCG tablet TAKE ONE TABLET BY MOUTH EVERY MORNING BEFORE BREAKFAST   . loperamide (IMODIUM A-D) 2 MG tablet Take by mouth.    . losartan (COZAAR) 25 MG tablet Take 2 tablets (50 mg total) by mouth daily.   . Menthol-Zinc Oxide (GOLD BOND EX) Apply 1 application topically daily as needed (muscle pain).   . metoprolol succinate (TOPROL-XL) 25 MG 24 hr tablet TAKE ONE TABLET BY MOUTH ONCE DAILY   . mirabegron ER (MYRBETRIQ) 25 MG TB24 tablet Take 1 tablet (25 mg total) by mouth daily. From Urology   . Multiple Vitamin (MULTIVITAMIN WITH MINERALS) TABS tablet Take 1 tablet by mouth daily.   . mupirocin ointment (BACTROBAN) 2 % Apply 1 application topically 2 (two) times daily. For up to 2 weeks then stop. May repeat in future as needed, can use to help prevent infection of  toe.   . nitroGLYCERIN (NITROSTAT) 0.4 MG SL tablet Place 1 tablet (0.4 mg total) under the tongue every 5 (five) minutes x 3 doses as needed for chest pain.   [name] omeprazole (PRILOSEC) 20 MG capsule Take 20 mg by mouth in the morning.    Lucas Caldwell Glycol-Propyl Glycol (SYSTANE OP) Place 1 drop into both eyes 3 (three) times daily as needed (dry/irritated eyes.).    [name] polyethylene glycol (MIRALAX / GLYCOLAX) 17 g packet Take 17 g by mouth daily as needed for mild constipation.    . potassium chloride SA (KLOR-CON) 20 MEQ tablet Take 2 tablets (40 mEq total) by mouth daily.   . ranolazine (RANEXA) 500 MG 12 hr tablet  Take 1 tablet (500 mg total) by mouth 2 (two) times daily.   [name] RELION TRUE METRIX TEST STRIPS test strip Use to check blood sugar up to 2 times daily   . rosuvastatin (CRESTOR) 5 MG tablet TAKE ONE TABLET BY MOUTH ONCE DAILY   . simethicone (GAS-X) 80 MG chewable tablet Chew 80 mg by mouth every 6 (six) hours as needed for flatulence.   . tamsulosin (FLOMAX) 0.4 MG CAPS capsule Take 1 capsule (0.4 mg total) by mouth daily after supper.   . torsemide (DEMADEX) 20 MG tablet Take 3 tablets (60 mg total) by mouth daily.   . traMADol (ULTRAM) 50 MG tablet Take 1-2 tablets (50-100 mg total) by mouth at bedtime as needed (pain.).   [name] TRULICITY 1.5 FK/8.1EX SOPN Inject 1.5 mg into the skin once a week. 02/04/2020: On Saturdays  . warfarin (COUMADIN) 3 MG tablet TAKE 1 TO 1 & 1/2 TABLETS BY MOUTH ONCE DAILY AS DIRECTED BY THE COUMADIN CLINIC    No facility-administered encounter medications on file as of 02/23/2020.    Goals Addressed            This Visit's Progress   . PharmD - Medication Managment       CARE PLAN ENTRY (see longitudinal plan of care for additional care plan information)  Current Barriers:  . Chronic Disease Management support, education, and care coordination needs related to type 2 diabetes, CKD, HFrEF s/p CardioMEMS, hyperlipidemia, atrial fibrillation, hypertension, coronary artery disease, CHF, hypothyroidism, and CML . Financial o Note patient has Partial Extra Help/LIS Subsidy o Patient enrolled in [place] [employer] health practitioner and Trulicity) and [employer] [place]) patient assistance programs for 2021 calendar year . Limited vision  Pharmacist Clinical Goal(s):  [name] Over the next 30 days, patient will work with CM Pharmacist to address needs related to medication adherence and diabetes management  Interventions: . Receive coordination of care message from [place] regarding medication assistance applications for [name] ? Reports patient has been  approved for re-enrollment for [name] and Trulicity from [place] for 2022 calendar year ? States that for 2022 renewal of patient assistance for Joaquim Nam is requiring additional documentation: ? Print out from his pharmacy showing the copay with quantity and days supply that he would pay for the Jardiance . Collaborate with [facility]. Pharmacist advises that pharmacy does not have a Rx for Jardiance on file for patient. ? Will collaborate with provider to request Rx for Jardiance sent to pharmacy for purpose of determining copayment and then follow up with [facility] to request required documentation be faxed to [place] . Follow up with patient/wife regarding patient's diabetes medication management and monitoring ? Reports patient taking: ? [name] 22 untis daily ? Trulicity 1.5  mg once weekly on Saturdays ? Jardiance 10 mg once daily before breakfast ? Patient reports recent AM CBGs readings  AM Blood Sugar  2 - December 151  3 - December 147  4 - December 151  5 - December 129  6 - December 149  7 - December 177  8 - December 131  Average 148   ? [name] states, per instruction from PCP to reduced [name] dose down by about 1-2 units at a time, if fasting sugar is average < 170 she will accordingly reduce patient's [name] dose from 22 units to 20 units daily at this time ? Reports patient's appetite is about the same as prior to dose increase of Trulicity. Reports patient is drinking Ensure supplement as recommended by PCP . Spouse denies further medication questions/concerns on behalf of patient.   Patient Self Care Activities:  . Check blood sugar, blood pressure and daily weight as directed by providers  . Patient takes medications, with assistance of wife, as directed o Mrs. Fort manages patient's medications using weekly pillbox as adherence tool . Patient attends scheduled medical appointments  Please see past updates related to  this goal by clicking on the "Past Updates" button in the selected goal         Plan  The care management team will reach out to the patient again over the next 7 days.    [name], PharmD, [place] [employer] [phone]

## 2020-02-23 NOTE — Patient Instructions (Signed)
Thank you allowing the Chronic Care Management Team to be a part of your care! It was a pleasure speaking with you today!     CCM (Chronic Care Management) Team    Noreene Larsson RN, MSN, CCM Nurse Care Coordinator  [phone]   [name] PharmD  Clinical Pharmacist  [phone]   Eula Fried LCSW Clinical Social Worker [phone]  Visit Information  Goals Addressed            This Visit's Progress   . PharmD - Medication Managment       CARE PLAN ENTRY (see longitudinal plan of care for additional care plan information)  Current Barriers:  . Chronic Disease Management support, education, and care coordination needs related to type 2 diabetes, CKD, HFrEF s/p CardioMEMS, hyperlipidemia, atrial fibrillation, hypertension, coronary artery disease, CHF, hypothyroidism, and CML . Financial o Note patient has Partial Extra Help/LIS Subsidy o Patient enrolled in [place] [employer] health practitioner and Trulicity) and [employer] [place]) patient assistance programs for 2021 calendar year . Limited vision  Pharmacist Clinical Goal(s):  [name] Over the next 30 days, patient will work with CM Pharmacist to address needs related to medication adherence and diabetes management  Interventions: . Receive coordination of care message from [place] regarding medication assistance applications for Mr. Cangemi ? Reports patient has been approved for re-enrollment for [name] and Trulicity from [place] for 2022 calendar year ? States that for 2022 renewal of patient assistance for Joaquim Nam is requiring additional documentation: ? Print out from his pharmacy showing the copay with quantity and days supply that he would pay for the Jardiance . Collaborate with [facility]. Pharmacist advises that pharmacy does not have a Rx for Jardiance on file for patient. ? Will collaborate with provider to request Rx for Jardiance sent to pharmacy for purpose of  determining copayment and then follow up with [facility] to request required documentation be faxed to [place] . Follow up with patient/wife regarding patient's diabetes medication management and monitoring ? Reports patient taking: ? [name] 22 untis daily ? Trulicity 1.5 mg once weekly on Saturdays ? Jardiance 10 mg once daily before breakfast ? Patient reports recent AM CBGs readings  AM Blood Sugar  2 - December 151  3 - December 147  4 - December 151  5 - December 129  6 - December 149  7 - December 177  8 - December 131  Average 148   ? [name] states, per instruction from PCP to reduced [name] dose down by about 1-2 units at a time, if fasting sugar is average < 170 she will accordingly reduce patient's [name] dose from 22 units to 20 units daily at this time ? Reports patient's appetite is about the same as prior to dose increase of Trulicity. Reports patient is drinking Ensure supplement as recommended by PCP . Spouse denies further medication questions/concerns on behalf of patient.   Patient Self Care Activities:  . Check blood sugar, blood pressure and daily weight as directed by providers  . Patient takes medications, with assistance of wife, as directed o Mrs. Sparks manages patient's medications using weekly pillbox as adherence tool . Patient attends scheduled medical appointments  Please see past updates related to this goal by clicking on the "Past Updates" button in the selected goal         The patient verbalized understanding of instructions, educational materials, and care plan provided today and declined offer to receive copy  of patient instructions, educational materials, and care plan.   The care management team will reach out to the patient again over the next 7 days.   [name], PharmD, [place] [employer] [phone]

## 2020-02-24 ENCOUNTER — Telehealth: Payer: Self-pay | Admitting: Internal Medicine

## 2020-02-24 ENCOUNTER — Telehealth (HOSPITAL_COMMUNITY): Payer: Self-pay | Admitting: Pharmacist

## 2020-02-24 ENCOUNTER — Ambulatory Visit: Payer: Self-pay | Admitting: *Deleted

## 2020-02-24 MED ORDER — EMPAGLIFLOZIN 10 MG PO TABS
10.0000 mg | ORAL_TABLET | Freq: Every day | ORAL | 1 refills | Status: DC
Start: 2020-02-24 — End: 2020-09-11

## 2020-02-24 NOTE — Telephone Encounter (Signed)
If Home Health RN is calling please get Coumadin Nurse on the phone STAT  1.  Are you calling in regards to an appointment?  no  2.  Are you calling for a refill ? no  3.  Are you having bleeding issues? Yes nose bleeding interim since yesterday   4.  Do you need clearance to hold Coumadin? No    Please route to the Coumadin Clinic Pool

## 2020-02-24 NOTE — Telephone Encounter (Signed)
Called received from [place] and pt's wife was on the phone and she states the pt had a nosebleed yesterday. After asking some questions she states the pt is not bleeding now and that the pt may need to cut down on his Warfarin.  Advised that in many cases the INR/Warfarin is not always the reason he is having a nosebleed; things like humidity, picking it, and sinus things can be the reason, but the INR can be checked after he seeks assistance for his nosebleed that she is now stating he is having but it is only bleeding a little.  She asked if the Dr. Saunders Revel needs to check his nose and advised that Dr. Saunders Revel would not check the nose but she could seek medical care for him at Urgent Care, ER, PCP, or ENT if he has one. She named the doctors the pt has and said they have a PCP. Asked again if the nose was bleeding and she stated just a little. Advised that they should call his PCP and see if they can assess his nosebleed. Advised that some PCP offices can check INR and if they do let us know or call back so we can check it. She stated she would call them and I will follow up to ensure later and see if she can come to have INR checked.

## 2020-02-24 NOTE — Telephone Encounter (Signed)
Nosebleed yesterday and early this morning. Wife reporting mild-1 cloth of blood. No clots noted and just several minutes to stop bleeding. Takes Coumadin 1-1.5mg  almost every day. Nose feels dry, no humidifier available.  Denies any other new symptoms. Care advice including pinching the nose, head forward, use nasal spray several times daily, no blowing/picking nose today. If worsens, seek evaluation at the UC or call back.  Reason for Disposition . [1] Mild-moderate nosebleed AND [2] bleeding stopped now  Answer Assessment - Initial Assessment Questions 1. AMOUNT OF BLEEDING: "How bad is the bleeding?" "How much blood was lost?" "Has the bleeding stopped?"   - MILD: needed a couple tissues   - MODERATE: needed many tissues   - SEVERE: large blood clots, soaked many tissues, lasted more than 30 minutes      mild 2. ONSET: "When did the nosebleed start?"      Yesterday 3. FREQUENCY: "How many nosebleeds have you had in the last 24 hours?"      2 4. RECURRENT SYMPTOMS: "Have there been other recent nosebleeds?" If Yes, ask: "How long did it take you to stop the bleeding?" "What worked best?"      no 5. CAUSE: "What do you think caused this nosebleed?"     Dry air 6. LOCAL FACTORS: "Do you have any cold symptoms?", "Have you been rubbing or picking at your nose?"     no 7. SYSTEMIC FACTORS: "Do you have high blood pressure or any bleeding problems?"     Takes medicine 8. BLOOD THINNERS: "Do you take any blood thinners?" (e.g., coumadin, heparin, aspirin, Plavix)     Coumadin 1 mg EOD and 1.5mg  on MWF 9. OTHER SYMPTOMS: "Do you have any other symptoms?" (e.g., lightheadedness) no 10. PREGNANCY: "Is there any chance you are pregnant?" "When was your last menstrual period?"       na  Protocols used: [facility]

## 2020-02-24 NOTE — Telephone Encounter (Signed)
Jardiance prescription sent to [facility].

## 2020-02-25 ENCOUNTER — Other Ambulatory Visit: Payer: Self-pay | Admitting: Family Medicine

## 2020-02-25 ENCOUNTER — Ambulatory Visit: Payer: Self-pay | Admitting: Pharmacist

## 2020-02-25 DIAGNOSIS — Z794 Long term (current) use of insulin: Secondary | ICD-10-CM

## 2020-02-25 DIAGNOSIS — E1142 Type 2 diabetes mellitus with diabetic polyneuropathy: Secondary | ICD-10-CM

## 2020-02-25 DIAGNOSIS — E1121 Type 2 diabetes mellitus with diabetic nephropathy: Secondary | ICD-10-CM

## 2020-02-25 NOTE — Chronic Care Management (AMB) (Signed)
Chronic Care Management   Follow Up Note   02/25/2020 Name: Lucas Caldwell MRN: [id number] DOB: 11/19/1933  Referred by: Karamalegos, Alexander J, DO Reason for referral : No chief complaint on file.   Lucas Caldwell is a 86 y.o. year old male who is a primary care patient of Karamalegos, Alexander J, DO. The CCM team was consulted for assistance with chronic disease management and care coordination needs.    I reached out to patient's wife by phone today.   Review of patient status, including review of consultants reports, relevant laboratory and other test results, and collaboration with appropriate care team members and the patient's provider was performed as part of comprehensive patient evaluation and provision of chronic care management services.    SDOH (Social Determinants of Health) assessments performed: No See Care Plan activities for detailed interventions related to SDOH)     Outpatient Encounter Medications as of 02/25/2020  Medication Sig Note  . Accu-Chek Softclix Lancets lancets Use to check blood sugar up to 2 times daily.   . acetaminophen (TYLENOL) 500 MG tablet Take 1,000 mg by mouth every 6 (six) hours as needed for moderate pain or headache.    . Alcohol Swabs (B-D SINGLE USE SWABS REGULAR) PADS Use to check blood sugar up to 2 times daily   . allopurinol (ZYLOPRIM) 100 MG tablet Take 1 tablet (100 mg total) by mouth daily.   . aspirin 81 MG chewable tablet Chew 81 mg by mouth daily.   . Blood Glucose Monitoring Suppl (ACCU-CHEK AVIVA PLUS) w/Device KIT Use to check blood sugar up to 2 times daily   . Blood Glucose Monitoring Suppl (TRUE METRIX METER) DEVI Use to check blood sugar up to 2 times daily   . bosutinib (BOSULIF) 100 MG tablet Take 100 mg by mouth at bedtime as needed (tired/not feeling well). Take with food.    . empagliflozin (JARDIANCE) 10 MG TABS tablet Take 1 tablet (10 mg total) by mouth daily before breakfast.   . gabapentin (NEURONTIN)  300 MG capsule TAKE TWO CAPSULES BY MOUTH TWICE DAILY. (MAX DOSE = 1200MG DAILY FOR KIDNEY FUNCTION)   . hydrALAZINE (APRESOLINE) 25 MG tablet Take 1 tablet (25 mg total) by mouth 3 (three) times daily.   . Insulin Glargine ([name] KWIKPEN) 100 UNIT/ML Inject 22 Units into the skin daily.   . Insulin Pen Needle 31G X 5 MM MISC 31 g by Does not apply route as directed.   . isosorbide mononitrate (IMDUR) 60 MG 24 hr tablet Take 1 tablet (60 mg total) by mouth daily.   . levothyroxine (SYNTHROID) 25 MCG tablet TAKE ONE TABLET BY MOUTH EVERY MORNING BEFORE BREAKFAST   . loperamide (IMODIUM A-D) 2 MG tablet Take by mouth.    . losartan (COZAAR) 25 MG tablet Take 2 tablets (50 mg total) by mouth daily.   . Menthol-Zinc Oxide (GOLD BOND EX) Apply 1 application topically daily as needed (muscle pain).   . metoprolol succinate (TOPROL-XL) 25 MG 24 hr tablet TAKE ONE TABLET BY MOUTH ONCE DAILY   . mirabegron ER (MYRBETRIQ) 25 MG TB24 tablet Take 1 tablet (25 mg total) by mouth daily. From Urology   . Multiple Vitamin (MULTIVITAMIN WITH MINERALS) TABS tablet Take 1 tablet by mouth daily.   . mupirocin ointment (BACTROBAN) 2 % Apply 1 application topically 2 (two) times daily. For up to 2 weeks then stop. May repeat in future as needed, can use to help prevent infection of toe.   .   nitroGLYCERIN (NITROSTAT) 0.4 MG SL tablet Place 1 tablet (0.4 mg total) under the tongue every 5 (five) minutes x 3 doses as needed for chest pain.   [name] omeprazole (PRILOSEC) 20 MG capsule Take 20 mg by mouth in the morning.    Lucas Caldwell Glycol-Propyl Glycol (SYSTANE OP) Place 1 drop into both eyes 3 (three) times daily as needed (dry/irritated eyes.).    [name] polyethylene glycol (MIRALAX / GLYCOLAX) 17 g packet Take 17 g by mouth daily as needed for mild constipation.    . potassium chloride SA (KLOR-CON) 20 MEQ tablet Take 2 tablets (40 mEq total) by mouth daily.   . ranolazine (RANEXA) 500 MG 12 hr tablet Take 1 tablet (500 mg  total) by mouth 2 (two) times daily.   [name] RELION TRUE METRIX TEST STRIPS test strip Use to check blood sugar up to 2 times daily   . rosuvastatin (CRESTOR) 5 MG tablet TAKE ONE TABLET BY MOUTH ONCE DAILY   . simethicone (GAS-X) 80 MG chewable tablet Chew 80 mg by mouth every 6 (six) hours as needed for flatulence.   . tamsulosin (FLOMAX) 0.4 MG CAPS capsule Take 1 capsule (0.4 mg total) by mouth daily after supper.   . torsemide (DEMADEX) 20 MG tablet Take 3 tablets (60 mg total) by mouth daily.   . traMADol (ULTRAM) 50 MG tablet Take 1-2 tablets (50-100 mg total) by mouth at bedtime as needed (pain.).   [name] TRULICITY 1.5 AC/1.6SA SOPN Inject 1.5 mg into the skin once a week. 02/04/2020: On Saturdays  . warfarin (COUMADIN) 3 MG tablet TAKE 1 TO 1 & 1/2 TABLETS BY MOUTH ONCE DAILY AS DIRECTED BY THE COUMADIN CLINIC    No facility-administered encounter medications on file as of 02/25/2020.    Goals Addressed            This Visit's Progress   . PharmD - Medication Managment       CARE PLAN ENTRY (see longitudinal plan of care for additional care plan information)  Current Barriers:  . Chronic Disease Management support, education, and care coordination needs related to type 2 diabetes, CKD, HFrEF s/p CardioMEMS, hyperlipidemia, atrial fibrillation, hypertension, coronary artery disease, CHF, hypothyroidism, and CML . Financial o Note patient has Partial Extra Help/LIS Subsidy o Patient enrolled in [place] [employer] health practitioner and Trulicity) and [employer] [place]) patient assistance programs for 2021 calendar year . Limited vision  Pharmacist Clinical Goal(s):  [name] Over the next 30 days, patient will work with CM Pharmacist to address needs related to medication adherence and diabetes management  Interventions: . Received coordination of care message from Lucas Caldwell regarding medication assistance applications for Lucas Caldwell ? Reports patient has been approved for  re-enrollment for [name] and Trulicity from [place] for 2022 calendar year ? States that for 2022 renewal of patient assistance for [place], [employer] requiring additional documentation: ? Print out from his pharmacy showing the copay with quantity and days supply that he would pay for the Jardiance . Collaborated with Audry Riles, RPh-CPP with [facility]. Provider sends Rx for Jardiance to pharmacy for purpose of determining copayment  . Collaborated with Geophysicist/field seismologist at [facility] to request required documentation be faxed to [place] . Follow up with patient's wife today . Spouse denies further medication questions/concerns on behalf of patient.  . Will send referral to Care Guide team to request team aid patient with obtaining coupons for Glucerna   Patient Self Care Activities:  . Check  blood sugar, blood pressure and daily weight as directed by providers  . Patient takes medications, with assistance of wife, as directed o Mrs. Deacon manages patient's medications using weekly pillbox as adherence tool . Patient attends scheduled medical appointments  Please see past updates related to this goal by clicking on the "Past Updates" button in the selected goal         Plan  Telephone follow up appointment with care management team member scheduled for: 03/24/2020 at 10:30 am  [name], PharmD, [place] [phone]

## 2020-02-25 NOTE — Patient Instructions (Addendum)
Thank you allowing the Chronic Care Management Team to be a part of your care! It was a pleasure speaking with you today!     CCM (Chronic Care Management) Team    Noreene Larsson RN, MSN, CCM Nurse Care Coordinator  [phone]   [name] PharmD  Clinical Pharmacist  [phone]   Eula Fried LCSW Clinical Social Worker [phone]  Visit Information  Goals Addressed            This Visit's Progress   . PharmD - Medication Managment       CARE PLAN ENTRY (see longitudinal plan of care for additional care plan information)  Current Barriers:  . Chronic Disease Management support, education, and care coordination needs related to type 2 diabetes, CKD, HFrEF s/p CardioMEMS, hyperlipidemia, atrial fibrillation, hypertension, coronary artery disease, CHF, hypothyroidism, and CML . Financial o Note patient has Partial Extra Help/LIS Subsidy o Patient enrolled in [place] Environmental health practitioner and Trulicity) and [employer] [place]) patient assistance programs for 2021 calendar year . Limited vision  Pharmacist Clinical Goal(s):  [name] Over the next 30 days, patient will work with CM Pharmacist to address needs related to medication adherence and diabetes management  Interventions: . Received coordination of care message from Maloy Simcox regarding medication assistance applications for Mr. Rayon ? Reports patient has been approved for re-enrollment for [name] and Trulicity from [place] for 2022 calendar year ? States that for 2022 renewal of patient assistance for [place], [employer] requiring additional documentation: ? Print out from his pharmacy showing the copay with quantity and days supply that he would pay for the Jardiance . Collaborated with Audry Riles, RPh-CPP with [facility]. Provider sends Rx for Jardiance to pharmacy for purpose of determining copayment  . Collaborated with Geophysicist/field seismologist at [facility] to request required documentation be  faxed to [place] . Follow up with patient's wife today . Spouse denies further medication questions/concerns on behalf of patient.  . Will send referral to Care Guide team to request team aid patient with obtaining coupons for Glucerna   Patient Self Care Activities:  . Check blood sugar, blood pressure and daily weight as directed by providers  . Patient takes medications, with assistance of wife, as directed o Mrs. Decou manages patient's medications using weekly pillbox as adherence tool . Patient attends scheduled medical appointments  Please see past updates related to this goal by clicking on the "Past Updates" button in the selected goal         The patient verbalized understanding of instructions, educational materials, and care plan provided today and declined offer to receive copy of patient instructions, educational materials, and care plan.   Telephone follow up appointment with care management team member scheduled for: 03/24/2020 10:30 AM  [name], PharmD, [facility] [phone]

## 2020-02-25 NOTE — Addendum Note (Signed)
Addended by: [name] A on: 02/25/2020 02:26 PM   Modules accepted: Orders

## 2020-02-28 ENCOUNTER — Telehealth: Payer: Self-pay | Admitting: Family Medicine

## 2020-02-28 NOTE — Telephone Encounter (Signed)
Lucas Caldwell 02/28/2020 Called pt regarding community resource referral received. Left message for pt to call me back, my info is [phone] please see ref notes for more details.  Lucas Caldwell Care Guide, Embedded Care Coordination [place], Care Management    

## 2020-02-29 ENCOUNTER — Telehealth: Payer: Self-pay

## 2020-02-29 DIAGNOSIS — R5383 Other fatigue: Secondary | ICD-10-CM

## 2020-02-29 DIAGNOSIS — R0683 Snoring: Secondary | ICD-10-CM

## 2020-02-29 NOTE — Telephone Encounter (Signed)
Order faxed.

## 2020-02-29 NOTE — Telephone Encounter (Signed)
Please fax order  [phone] .

## 2020-02-29 NOTE — Telephone Encounter (Signed)
Split study

## 2020-02-29 NOTE — Telephone Encounter (Signed)
Ok thanks. What order needs to be faxed???

## 2020-02-29 NOTE — Telephone Encounter (Signed)
Lucas Caldwell ,   Just fyi [name] ordered this sleep study and patient called [facility] to cancel.  They do not wants to drive to [place] and want to reschedule.  [facility] is going to cancel the [facility] appt and I LMOV at [facility] sleep lab.  Just wanted to make sure you are aware for fu.   Thanks   [place] at [facility] [address] [place],  [place]  [address] Main: [phone] ===View-only below this line=== ----- Message ----- From: Memory Dance Sent: 02/28/2020   6:18 PM EST To: Lucas Caldwell Subject: RE: reschedule sleep study                     Hi. We don't schedule for the [facility] sleep lab. You would have to call them directly. We do not have a number for their sleep lab. Would you like for me to go ahead and cancel the study scheduled for [name]? ----- Message ----- From: Lucas Caldwell Sent: 02/28/2020   4:55 PM EST To: Memory Dance Subject: reschedule sleep study                         Hey lady   this  patient wants to reschedule this  sleep study to [place] .     Can you help me ?

## 2020-03-01 ENCOUNTER — Telehealth: Payer: Self-pay | Admitting: Family Medicine

## 2020-03-01 NOTE — Telephone Encounter (Signed)
   Referral Reason: getting coupons for glucerna  Interventions: Unsuccessful outbound call placed to the patient.  Follow up plan: Care guide will follow up with patient by phone over the next couple of days. My information is [place] [phone]   \

## 2020-03-02 MED ORDER — POTASSIUM CHLORIDE ER 20 MEQ TABLET,EXTENDED RELEASE(PART/CRYST)
Freq: Two times a day (BID) | ORAL | 0 days
Start: 2020-03-02 — End: ?

## 2020-03-03 ENCOUNTER — Ambulatory Visit: Payer: Medicare HMO | Admitting: Pharmacist

## 2020-03-03 ENCOUNTER — Telehealth (HOSPITAL_COMMUNITY): Payer: Self-pay | Admitting: *Deleted

## 2020-03-03 ENCOUNTER — Telehealth: Payer: Self-pay | Admitting: Family Medicine

## 2020-03-03 ENCOUNTER — Telehealth (HOSPITAL_COMMUNITY): Payer: Self-pay | Admitting: Pharmacy Technician

## 2020-03-03 DIAGNOSIS — E1121 Type 2 diabetes mellitus with diabetic nephropathy: Secondary | ICD-10-CM

## 2020-03-03 DIAGNOSIS — I5023 Acute on chronic systolic (congestive) heart failure: Secondary | ICD-10-CM

## 2020-03-03 NOTE — Telephone Encounter (Signed)
Samples of Jardiance left at front desk for pt to p/u   Medication Samples have been provided to the patient.  Drug name: Jardiance       Strength: 10 mg        Qty: 4  LOT: [id number]  Exp.Date: 9/23  Dosing instructions: Take 1 tab Daily  The patient has been instructed regarding the correct time, dose, and frequency of taking this medication, including desired effects and most common side effects.   Rosemary Pentecost 2:14 PM 03/03/2020

## 2020-03-03 NOTE — Patient Instructions (Signed)
Thank you allowing the Chronic Care Management Team to be a part of your care! It was a pleasure speaking with you today!     CCM (Chronic Care Management) Team    Noreene Larsson RN, MSN, CCM Nurse Care Coordinator  [phone]   [name] PharmD  Clinical Pharmacist  [phone]   Eula Fried LCSW Clinical Social Worker [phone]  Visit Information  Goals Addressed            This Visit's Progress   . PharmD - Medication Managment       CARE PLAN ENTRY (see longitudinal plan of care for additional care plan information)  Current Barriers:  . Chronic Disease Management support, education, and care coordination needs related to type 2 diabetes, CKD, HFrEF s/p CardioMEMS, hyperlipidemia, atrial fibrillation, hypertension, coronary artery disease, CHF, hypothyroidism, and CML . Financial o Note patient has Partial Extra Help/LIS Subsidy o Patient enrolled in [place] [employer] health practitioner and [employer]ul[employer]) patient assistance program for 2022 calendar year o Patient denied for patient assistance for Lorenso [place]ar[place] from [employer] for 2022 calendar year . Limited vision  Pharmacist Clinical Goal(s):  [name] Over the next 30 days, patient will work with CM Pharmacist to address needs related to medication adherence and diabetes management  Interventions: . Received coordination of care message from Monson Simcox advising that patient was denied for patient assistance for Jardiance for 2022 calendar year based on copayment amount/household income.  . Review alternative medication assistance options. Note heart failure funds from both [place] and [employer] are currently closed . Follow up with patient and his wife today. o Advise patient's wife of the denial o Reports unable to afford copayment through health plan for Jardiance o Reports patient has 5 tablets of Jardiance remaining. Denies having requested refill from assistane program - Note patient  currently enrolled in [employer] assistance program through 03/17/2020. - Advise to reorder refill through program today . From review of chart, note that [place] has attempted to reach patient regarding coupons for Glucerna.  o Provide Mrs. Mireles with phone number for Care Guide and encourage her to return the call. . Collaborate with [facility] RPh-CPP Audry Riles and [place]. CPhT advises clinic will provide patient with samples of Jardiance and while placing patient on [place] waitlist for heart failure fund.   Patient Self Care Activities:  . Check blood sugar, blood pressure and daily weight as directed by providers  . Patient takes medications, with assistance of wife, as directed o Mrs. Sochacki manages patient's medications using weekly pillbox as adherence tool . Patient attends scheduled medical appointments  Please see past updates related to this goal by clicking on the "Past Updates" button in the selected goal         The patient verbalized understanding of instructions, educational materials, and care plan provided today and declined offer to receive copy of patient instructions, educational materials, and care plan.   Telephone follow up appointment with care management team member scheduled for: 03/24/2020 at 10:30 am  [name], PharmD, [place] [phone]

## 2020-03-03 NOTE — Chronic Care Management (AMB) (Signed)
Chronic Care Management   Follow Up Note   03/03/2020 Name: Lucas Caldwell MRN: [id number] DOB: 06-07-1933  Referred by: Lucas Hauser, DO Reason for referral : Chronic Care Management (Patient Phone Call)   Lucas Caldwell is a 86 y.o. year old male who is a primary care patient of Lucas Hauser, DO. The CCM team was consulted for assistance with chronic disease management and care coordination needs.    I reached out to Lucas Caldwell and his wife by phone today.   Review of patient status, including review of consultants reports, relevant laboratory and other test results, and collaboration with appropriate care team members and the patient's provider was performed as part of comprehensive patient evaluation and provision of chronic care management services.    SDOH (Social Determinants of Health) assessments performed: No See Care Plan activities for detailed interventions related to Lucas Caldwell)     Outpatient Encounter Medications as of 03/03/2020  Medication Sig Note  . acetaminophen (TYLENOL) 500 MG tablet Take 1,000 mg by mouth every 6 (six) hours as needed for moderate pain or headache.    . Alcohol Swabs (B-D SINGLE USE SWABS REGULAR) PADS Use to check blood sugar up to 2 times daily   . allopurinol (ZYLOPRIM) 100 MG tablet Take 1 tablet (100 mg total) by mouth daily.   [name] aspirin 81 MG chewable tablet Chew 81 mg by mouth daily.   . Blood Glucose Monitoring Suppl (TRUE METRIX METER) DEVI Use to check blood sugar up to 2 times daily   . bosutinib (BOSULIF) 100 MG tablet Take 100 mg by mouth at bedtime as needed (tired/not feeling well). Take with food.    . empagliflozin (JARDIANCE) 10 MG TABS tablet Take 1 tablet (10 mg total) by mouth daily before breakfast.   . gabapentin (NEURONTIN) 300 MG capsule TAKE TWO CAPSULES BY MOUTH TWICE DAILY. (MAX DOSE = 1200MG  DAILY FOR KIDNEY FUNCTION)   . hydrALAZINE (APRESOLINE) 25 MG tablet Take 1 tablet (25 mg  total) by mouth 3 (three) times daily.   . Insulin Glargine ([name] KWIKPEN) 100 UNIT/ML Inject 22 Units into the skin daily.   . Insulin Pen Needle 31G X 5 MM MISC 31 g by Does not apply route as directed.   . isosorbide mononitrate (IMDUR) 60 MG 24 hr tablet Take 1 tablet (60 mg total) by mouth daily.   [name] levothyroxine (SYNTHROID) 25 MCG tablet TAKE ONE TABLET BY MOUTH EVERY MORNING BEFORE BREAKFAST   . loperamide (IMODIUM A-D) 2 MG tablet Take by mouth.    . losartan (COZAAR) 25 MG tablet Take 2 tablets (50 mg total) by mouth daily.   . Menthol-Zinc Oxide (GOLD BOND EX) Apply 1 application topically daily as needed (muscle pain).   . metoprolol succinate (TOPROL-XL) 25 MG 24 hr tablet TAKE ONE TABLET BY MOUTH ONCE DAILY   . mirabegron ER (MYRBETRIQ) 25 MG TB24 tablet Take 1 tablet (25 mg total) by mouth daily. From Urology   . Multiple Vitamin (MULTIVITAMIN WITH MINERALS) TABS tablet Take 1 tablet by mouth daily.   . mupirocin ointment (BACTROBAN) 2 % Apply 1 application topically 2 (two) times daily. For up to 2 weeks then stop. May repeat in future as needed, can use to help prevent infection of toe.   . nitroGLYCERIN (NITROSTAT) 0.4 MG SL tablet Place 1 tablet (0.4 mg total) under the tongue every 5 (five) minutes x 3 doses as needed for chest pain.   [name] omeprazole (PRILOSEC)  20 MG capsule Take 20 mg by mouth in the morning.    Lucas Caldwell Glycol-Propyl Glycol (SYSTANE OP) Place 1 drop into both eyes 3 (three) times daily as needed (dry/irritated eyes.).    [name] polyethylene glycol (MIRALAX / GLYCOLAX) 17 g packet Take 17 g by mouth daily as needed for mild constipation.    . potassium chloride SA (KLOR-CON) 20 MEQ tablet Take 2 tablets (40 mEq total) by mouth daily.   . ranolazine (RANEXA) 500 MG 12 hr tablet Take 1 tablet (500 mg total) by mouth 2 (two) times daily.   [name] RELION TRUE METRIX TEST STRIPS test strip Use to check blood sugar up to 2 times daily   . rosuvastatin (CRESTOR) 5 MG  tablet TAKE ONE TABLET BY MOUTH ONCE DAILY   . simethicone (GAS-X) 80 MG chewable tablet Chew 80 mg by mouth every 6 (six) hours as needed for flatulence.   . tamsulosin (FLOMAX) 0.4 MG CAPS capsule Take 1 capsule (0.4 mg total) by mouth daily after supper.   . torsemide (DEMADEX) 20 MG tablet Take 3 tablets (60 mg total) by mouth daily.   . traMADol (ULTRAM) 50 MG tablet Take 1-2 tablets (50-100 mg total) by mouth at bedtime as needed (pain.).   [name] TRULICITY 1.5 EY/8.1KG SOPN Inject 1.5 mg into the skin once a week. 02/04/2020: On Saturdays  . warfarin (COUMADIN) 3 MG tablet TAKE 1 TO 1 & 1/2 TABLETS BY MOUTH ONCE DAILY AS DIRECTED BY THE COUMADIN CLINIC    No facility-administered encounter medications on file as of 03/03/2020.    Goals Addressed            This Visit's Progress   . PharmD - Medication Managment       CARE PLAN ENTRY (see longitudinal plan of care for additional care plan information)  Current Barriers:  . Chronic Disease Management support, education, and care coordination needs related to type 2 diabetes, CKD, HFrEF s/p CardioMEMS, hyperlipidemia, atrial fibrillation, hypertension, coronary artery disease, CHF, hypothyroidism, and CML . Financial o Note patient has Partial Extra Help/LIS Subsidy o Patient enrolled in [place] [employer] health practitioner and [employer]) patient assistance program for 2022 calendar year o Patient denied for patient assistance for Lucas Caldwell from [employer] for 2022 calendar year . Limited vision  Pharmacist Clinical Goal(s):  [name] Over the next 30 days, patient will work with CM Pharmacist to address needs related to medication adherence and diabetes management  Interventions: . Received coordination of care message from Lucas Caldwell advising that patient was denied for patient assistance for Jardiance for 2022 calendar year based on copayment amount/household income.  . Review alternative medication assistance options. Note heart failure  funds from both [place] and [employer] are currently closed . Follow up with patient and his wife today. o Advise patient's wife of the denial o Reports unable to afford copayment through health plan for Jardiance o Reports patient has 5 tablets of Jardiance remaining. Denies having requested refill from assistane program - Note patient currently enrolled in [employer] assistance program through 03/17/2020. - Advise to reorder refill through program today . From review of chart, note that [facility] has attempted to reach patient regarding coupons for Glucerna.  o Provide Mrs. Villescas with phone number for Care Guide and encourage her to return the call. . Collaborate with [facility] and [place]. CPhT advises clinic will provide patient with samples of Jardiance and while placing patient on  [employer] waitlist for heart failure fund.   Patient Self Care Activities:  . Check blood sugar, blood pressure and daily weight as directed by providers  . Patient takes medications, with assistance of wife, as directed o Mrs. Macapagal manages patient's medications using weekly pillbox as adherence tool . Patient attends scheduled medical appointments  Please see past updates related to this goal by clicking on the "Past Updates" button in the selected goal         Plan  Telephone follow up appointment with care management team member scheduled for: 03/24/2020 at 10:30 am  [name], PharmD, [place] [phone]

## 2020-03-03 NOTE — Telephone Encounter (Signed)
Patient was denied assistance from [facility], due to having partial LIS and due to the companies new income cut off.  Asked nursing staff to provide a month of samples while we wait for the PAN grant to open. Another office helped to put the patient on the PAN wait list, we will not get a notification when the grant opens because of that.  Attempted to call patient to pick up samples, his voicemail was full.  Charlann Boxer, CPhT

## 2020-03-03 NOTE — Telephone Encounter (Signed)
Lucas Caldwell 03/03/2020 Called pt regarding community resource referral received. Left message for pt to call me back, my info is [phone] please see ref notes for more details.  [place], Care Management

## 2020-03-06 ENCOUNTER — Ambulatory Visit: Payer: Self-pay | Admitting: General Practice

## 2020-03-06 ENCOUNTER — Ambulatory Visit: Payer: Self-pay | Admitting: Pharmacist

## 2020-03-06 DIAGNOSIS — I25118 Atherosclerotic heart disease of native coronary artery with other forms of angina pectoris: Secondary | ICD-10-CM

## 2020-03-06 DIAGNOSIS — J432 Centrilobular emphysema: Secondary | ICD-10-CM

## 2020-03-06 DIAGNOSIS — Z794 Long term (current) use of insulin: Secondary | ICD-10-CM

## 2020-03-06 DIAGNOSIS — I10 Essential (primary) hypertension: Secondary | ICD-10-CM

## 2020-03-06 DIAGNOSIS — E1121 Type 2 diabetes mellitus with diabetic nephropathy: Secondary | ICD-10-CM

## 2020-03-06 DIAGNOSIS — I5032 Chronic diastolic (congestive) heart failure: Secondary | ICD-10-CM

## 2020-03-06 DIAGNOSIS — E785 Hyperlipidemia, unspecified: Secondary | ICD-10-CM

## 2020-03-06 NOTE — Chronic Care Management (AMB) (Signed)
Chronic Care Management   Follow Up Note   03/06/2020 Name: JAYLEND REILAND MRN: [id number] DOB: 1933-05-08  Referred by: [name], DO Reason for referral : Chronic Care Management (RNCM Follow Up from voicemail left by his wife asking for a call back. Chronic Disease Management and Care Coordination Needs)   LINKEN MCGLOTHEN is a 86 y.o. year old male who is a primary care patient of [name], DO. The CCM team was consulted for assistance with chronic disease management and care coordination needs.    Review of patient status, including review of consultants reports, relevant laboratory and other test results, and collaboration with appropriate care team members and the patient's provider was performed as part of comprehensive patient evaluation and provision of chronic care management services.    SDOH (Social Determinants of Health) assessments performed: Yes See Care Plan activities for detailed interventions related to [facility])     Outpatient Encounter Medications as of 03/06/2020  Medication Sig Note  . acetaminophen (TYLENOL) 500 MG tablet Take 1,000 mg by mouth every 6 (six) hours as needed for moderate pain or headache.    . Alcohol Swabs (B-D SINGLE USE SWABS REGULAR) PADS Use to check blood sugar up to 2 times daily   . allopurinol (ZYLOPRIM) 100 MG tablet Take 1 tablet (100 mg total) by mouth daily.   [name] aspirin 81 MG chewable tablet Chew 81 mg by mouth daily.   . Blood Glucose Monitoring Suppl (TRUE METRIX METER) DEVI Use to check blood sugar up to 2 times daily   . bosutinib (BOSULIF) 100 MG tablet Take 100 mg by mouth at bedtime as needed (tired/not feeling well). Take with food.    . empagliflozin (JARDIANCE) 10 MG TABS tablet Take 1 tablet (10 mg total) by mouth daily before breakfast.   . gabapentin (NEURONTIN) 300 MG capsule TAKE TWO CAPSULES BY MOUTH TWICE DAILY. (MAX DOSE = 1200MG  DAILY FOR KIDNEY FUNCTION)   . hydrALAZINE (APRESOLINE)  25 MG tablet Take 1 tablet (25 mg total) by mouth 3 (three) times daily.   . Insulin Glargine ([name] KWIKPEN) 100 UNIT/ML Inject 22 Units into the skin daily.   . Insulin Pen Needle 31G X 5 MM MISC 31 g by Does not apply route as directed.   . isosorbide mononitrate (IMDUR) 60 MG 24 hr tablet Take 1 tablet (60 mg total) by mouth daily.   [name] levothyroxine (SYNTHROID) 25 MCG tablet TAKE ONE TABLET BY MOUTH EVERY MORNING BEFORE BREAKFAST   . loperamide (IMODIUM A-D) 2 MG tablet Take by mouth.    . losartan (COZAAR) 25 MG tablet Take 2 tablets (50 mg total) by mouth daily.   . Menthol-Zinc Oxide (GOLD BOND EX) Apply 1 application topically daily as needed (muscle pain).   . metoprolol succinate (TOPROL-XL) 25 MG 24 hr tablet TAKE ONE TABLET BY MOUTH ONCE DAILY   . mirabegron ER (MYRBETRIQ) 25 MG TB24 tablet Take 1 tablet (25 mg total) by mouth daily. From Urology   . Multiple Vitamin (MULTIVITAMIN WITH MINERALS) TABS tablet Take 1 tablet by mouth daily.   . mupirocin ointment (BACTROBAN) 2 % Apply 1 application topically 2 (two) times daily. For up to 2 weeks then stop. May repeat in future as needed, can use to help prevent infection of toe.   . nitroGLYCERIN (NITROSTAT) 0.4 MG SL tablet Place 1 tablet (0.4 mg total) under the tongue every 5 (five) minutes x 3 doses as needed for chest pain.   [name]  omeprazole (PRILOSEC) 20 MG capsule Take 20 mg by mouth in the morning.    [name] Place 1 drop into both eyes 3 (three) times daily as needed (dry/irritated eyes.).    [name] polyethylene glycol (MIRALAX / GLYCOLAX) 17 g packet Take 17 g by mouth daily as needed for mild constipation.    . potassium chloride SA (KLOR-CON) 20 MEQ tablet Take 2 tablets (40 mEq total) by mouth daily.   . ranolazine (RANEXA) 500 MG 12 hr tablet Take 1 tablet (500 mg total) by mouth 2 (two) times daily.   [name] RELION TRUE METRIX TEST STRIPS test strip Use to check blood sugar up to 2 times daily    . rosuvastatin (CRESTOR) 5 MG tablet TAKE ONE TABLET BY MOUTH ONCE DAILY   . simethicone (GAS-X) 80 MG chewable tablet Chew 80 mg by mouth every 6 (six) hours as needed for flatulence.   . tamsulosin (FLOMAX) 0.4 MG CAPS capsule Take 1 capsule (0.4 mg total) by mouth daily after supper.   . torsemide (DEMADEX) 20 MG tablet Take 3 tablets (60 mg total) by mouth daily.   . traMADol (ULTRAM) 50 MG tablet Take 1-2 tablets (50-100 mg total) by mouth at bedtime as needed (pain.).   [name] TRULICITY 1.5 TK/2.4OX SOPN Inject 1.5 mg into the skin once a week. 02/04/2020: On Saturdays  . warfarin (COUMADIN) 3 MG tablet TAKE 1 TO 1 & 1/2 TABLETS BY MOUTH ONCE DAILY AS DIRECTED BY THE COUMADIN CLINIC    No facility-administered encounter medications on file as of 03/06/2020.     Objective:   Goals Addressed              This Visit's Progress   .  RN- I am getting more short winded but I feel better now (pt-stated)        Current Barriers:  [name] Knowledge deficit related to basic heart failure pathophysiology and self care management   Case Manager Clinical Goal(s):  [name] Over the next 120 days, patient will verbalize understanding of Heart Failure Action Plan and when to call doctor . Over the next 120 days, patient will take all Heart Failure mediations as prescribed . Over the next 120 days, patient will weigh daily and record (notifying MD of 3 lb weight gain over night or 5 lb in a week) . Over the next 120 days, patient will take blood pressure daily and record, notifying the provider for blood pressure outside of set parameters  Interventions:  . Basic overview and discussion of pathophysiology of Heart Failure reviewed  . Provided verbal education on low sodium diet, per the patient wife he is doing better with dietary restrictions. The patients wife continues to monitor and help patient  . Discussed importance of daily weight and advised patient to weigh and record daily: patient is now weighing  daily and recording, weight on 8-4 was 203.  The patients wife was not at home and the patient states that she keeps up with all of that "stuff" for him. 12-16-2019: the patient has a "pillow" that he gets on each day and it measures his weight and sends it the the cardiologist.  If he has extra fluid on board it alerts the provider and they call the patient and give instructions. The patients wife states he does this every day and they have not called but they do get the readings. It is a new thing they are doing and it is working well  for him.  [name] taking "3" fluid pills a day. The wife helps the patient keep up with his medications, appointments, and daily routines. 01-20-2020: The patient continues to use the pillow daily and his weight is staying 202 to 205 range. They call if the weight is out of range. The patients wife states that they have not received a call. She called to makes sure it was working right. The patient wife states they told her everything was fine and that if it was not accurate they would receive a call. Education and support given.  . Reviewed role of diuretics in prevention of fluid overload and management of heart failure.  His diuretic regimen is working well for him at this time. 01-20-2020: Review education and support.   . Discussed s/s of HF and increased fluid. Patient verbalized that he is feeling much better and went today to have a "test".  (ECHO- 04/22/2019 with an EF% of 30-35%) 03-06-2020: The patient is stable at this time. His wife verbalized he will follow up with the cardiologist next month. No changes in plan of care at this time. Has been working with the CCM pharmacist to understand medications and remain compliant. . Evaluation of oxygen needs: Patient reports using 02 at night successfully. The patient expressed he usually wakes up in the middle of the night and the oxygen has dried his nose out. He usually takes it off then and goes back to sleep without incident.   The patient does use oxygen during the day if he needs it. No acute distress noted. . Discussed the importance of Capac and the patient states PT and a nurse are coming into the home to check on him regularly. The patient feels he is improving. [name] ejection fraction: 30-35% 04-22-2019   Wt Readings from Last 3 Encounters:  01/06/20 205 lb (93 kg)  01/05/20 205 lb (93 kg)  12/22/19 207 lb (93.9 kg)     BP Readings from Last 3 Encounters:  01/06/20 122/72  01/05/20 128/80  12/22/19 121/61             Patient Self Care Activities:  . Takes Heart Failure Medications as prescribed . Weighs daily and record (notifying MD of 3 lb weight gain over night or 5 lb in a week) . Verbalizes understanding of and follows CHF Action Plan . Adheres to low sodium diet  Please see past updates related to this goal by clicking on the "Past Updates" button in the selected goal      .  RNCM: Pt's wife: "[name] still going to the bathroom alot"        [place] (see longitudinal plan of care for additional care plan information)  Current Barriers:  [name] Knowledge Deficits related to urinary incontinence and frequency . Chronic Disease Management support and education needs related to urinary incontinence and frequency . Lacks caregiver support.   Nurse Case Manager Clinical Goal(s):  [name] Over the next 120 days, patient will verbalize understanding of plan for urinary incontinence and frequency . Over the next 120 days, patient will work with [facility], [place] team, pcp and specialist  to address needs related to urinary changes . Over the next 120 days, patient will demonstrate a decrease in urinary incontinence and frequency exacerbations as evidenced by plan of care to help with urinary needs . Over the next 120 days, patient will attend all scheduled medical appointments: specialist on 02/08/2020, pcp on 04-24-2020 . Over the next 120 days,  patient will verbalize basic understanding of urinary/kidney  disease  process and self health management plan as evidenced by plan of care to help the patient with urinary incontinence and frequency  Interventions:  . Inter-disciplinary care team collaboration (see longitudinal plan of care) . Evaluation of current treatment plan related to urinary incontinence and frequency and patient's adherence to plan as established by provider. . Advised patient to talk to specialist about changes in urinary habits.  The patient has been taking myrbetiq and the patient feels this has helped but the wife states he is still going to the bathroom a lot.  . Provided education to patient re: Writing down questions to ask the provider at his next appointment on 02-08-2020.   [name] Reviewed medications with patient and discussed Myrbetiq and compliance. The patient has a supply of this medication to take for 30 days.  Nash Dimmer with CCM pharmacist  regarding Myrbetiq, recent follow up with the patient and the patients wife by the pharmacist.  . Discussed plans with patient for ongoing care management follow up and provided patient with direct contact information for care management team . Reviewed scheduled/upcoming provider appointments including: Specialist on 02-08-2020 and pcp on 04-24-2020  Patient Self Care Activities:  . Patient verbalizes understanding of plan to work with the CCM team, pcp, and specialist in managing urinary incontinence and frequency.  . Self administers medications as prescribed . Attends all scheduled provider appointments . Calls provider office for new concerns or questions . Unable to independently manage urinary incontinence and frequency   Please see past updates related to this goal by clicking on the "Past Updates" button in the selected goal      .  RNCM: We doing better at watching his blood pressure and sugars        Current Barriers:  . Chronic Disease Management support, education, and care coordination needs related to HTN, COPD, and  DMII  Clinical Goal(s) related to  HTN, COPD, and DMII:  Over the next 120 days, patient will:  . Work with the care management team to address educational, disease management, and care coordination needs  . Begin or continue self health monitoring activities as directed today Measure and record cbg (blood glucose) 1 times daily, Measure and record blood pressure 5 times per week, Measure and record weight daily, and adhere to a heart healthy/ADA diet . Call provider office for new or worsened signs and symptoms Blood glucose findings outside established parameters, Blood pressure findings outside established parameters, Weight outside established parameters, Oxygen saturation lower than established parameter, Shortness of breath, and New or worsened symptom related to Chronic health conditions . Call care management team with questions or concerns . Verbalize basic understanding of patient centered plan of care established today  Interventions related to  HTN, COPD, and DMII: . Evaluation of current treatment plans and patient's adherence to plan as established by provider.  01-20-2020: the patient states he feels he is doing very well right now with his chronic health conditions. Denies any acute distress. Sees providers regularly. The patient likes for the [facility] to talk to his wife. His wife states he is doing well.  . Assessed patient understanding of disease states- the patient verbalized he does well and his wife keeps up with all the things he needs. 12-16-2019: The patient's wife states the patient is stable at this time. Denies any new concerns at this time. 01-20-2020: The patient  has a new concern with urinary incontinence and frequency.  See new goal for this.  . Assessed patient's education and care coordination needs.  Ongoing support and education on maintaining a diet low in sodium and sugars. 12-16-2019: The patient is still waiting on his new glucose meter from [facility]. The patients wife  said she thought it would be here by now. Advised the wife to call [facility] and inquire about shipment. The wife states that she has a tracking number. She also states that he sill is using his other meter but needs a battery for it. Review of watching for hypoglycemia. 03-06-2020: The patients blood sugars are ranging 145 to 156, this am it was 150.  The patient doesn't have the best appetite but the patient is eating and staying hydrated.  Denies any episodes of hypoglycemia.   . Provided disease specific education to patient. 03-06-2020: Reminded the patient of the CCM team available to assist as needed and to continue to follow up with providers on a regular basis. Next appointment with the pcp on 04-24-2020.  The patients wife is interested in talking to the care guides about meal delivery service and also about help with installation of rails in the home to help with safety. Care guide referral placed.  Nash Dimmer with appropriate clinical care team members regarding patient needs: CCM pharmacist available and working with the patient also.  . Evaluation of sx/sx of shortness of breath. The patient denies shortness of breath at this times. Does admit he has some when he is active. Feels he has improved overall. 12-16-2019: The patient saw his cancer doctor yesterday and got a good report. The patient also received his COVID 19 booster shot. Vaccination record updated accordingly. The patients wife states he is doing fine and denies any side effects from the booster. 01-20-2020: The patient is stable with his COPD.  The wife states he still gets "short winded" but it is not worse than usual. The patients wife knows to call the provider for changes.   Patient Self Care Activities related to HTN, DMII, and COPD . Patient is unable to independently self-manage chronic health conditions  Please see past updates related to this goal by clicking on the "Past Updates" button in the selected goal            Plan:   Telephone follow up appointment with care management team member scheduled for: 03-16-2020   Noreene Larsson RN, MSN, [place] Mobile: [phone]

## 2020-03-06 NOTE — Patient Instructions (Signed)
Visit Information  Goals Addressed              This Visit's Progress     RN- I am getting more short winded but I feel better now (pt-stated)        Current Barriers:   Knowledge deficit related to basic heart failure pathophysiology and self care management   Case Manager Clinical Goal(s):   Over the next 120 days, patient will verbalize understanding of Heart Failure Action Plan and when to call doctor  Over the next 120 days, patient will take all Heart Failure mediations as prescribed  Over the next 120 days, patient will weigh daily and record (notifying MD of 3 lb weight gain over night or 5 lb in a week)  Over the next 120 days, patient will take blood pressure daily and record, notifying the provider for blood pressure outside of set parameters  Interventions:   Basic overview and discussion of pathophysiology of Heart Failure reviewed   Provided verbal education on low sodium diet, per the patient wife he is doing better with dietary restrictions. The patients wife continues to monitor and help patient   Discussed importance of daily weight and advised patient to weigh and record daily: patient is now weighing daily and recording, weight on 8-4 was 203.  The patients wife was not at home and the patient states that she keeps up with all of that "stuff" for him. 12-16-2019: the patient has a "pillow" that he gets on each day and it measures his weight and sends it the the cardiologist.  If he has extra fluid on board it alerts the provider and they call the patient and give instructions. The patients wife states he does this every day and they have not called but they do get the readings. It is a new thing they are doing and it is working well for him.  [name] taking "3" fluid pills a day. The wife helps the patient keep up with his medications, appointments, and daily routines. 01-20-2020: The patient continues to use the pillow daily and his weight is staying 202 to 205 range.  They call if the weight is out of range. The patients wife states that they have not received a call. She called to makes sure it was working right. The patient wife states they told her everything was fine and that if it was not accurate they would receive a call. Education and support given.   Reviewed role of diuretics in prevention of fluid overload and management of heart failure.  His diuretic regimen is working well for him at this time. 01-20-2020: Review education and support.    Discussed s/s of HF and increased fluid. Patient verbalized that he is feeling much better and went today to have a "test".  (ECHO- 04/22/2019 with an EF% of 30-35%) 03-06-2020: The patient is stable at this time. His wife verbalized he will follow up with the cardiologist next month. No changes in plan of care at this time. Has been working with the CCM pharmacist to understand medications and remain compliant.  Evaluation of oxygen needs: Patient reports using 02 at night successfully. The patient expressed he usually wakes up in the middle of the night and the oxygen has dried his nose out. He usually takes it off then and goes back to sleep without incident.  The patient does use oxygen during the day if he needs it. No acute distress noted.  Discussed the importance of [place] and  the patient states PT and a nurse are coming into the home to check on him regularly. The patient feels he is improving.  ejection fraction: 30-35% 04-22-2019   Wt Readings from Last 3 Encounters:  01/06/20 205 lb (93 kg)  01/05/20 205 lb (93 kg)  12/22/19 207 lb (93.9 kg)     BP Readings from Last 3 Encounters:  01/06/20 122/72  01/05/20 128/80  12/22/19 121/61             Patient Self Care Activities:   Takes Heart Failure Medications as prescribed  Weighs daily and record (notifying MD of 3 lb weight gain over night or 5 lb in a week)  Verbalizes understanding of and follows CHF Action Plan  Adheres to low sodium  diet  Please see past updates related to this goal by clicking on the "Past Updates" button in the selected goal        RNCM: Pt's wife: "[name] still going to the bathroom alot"        [place] (see longitudinal plan of care for additional care plan information)  Current Barriers:   Knowledge Deficits related to urinary incontinence and frequency  Chronic Disease Management support and education needs related to urinary incontinence and frequency  Lacks caregiver support.   Nurse Case Manager Clinical Goal(s):   Over the next 120 days, patient will verbalize understanding of plan for urinary incontinence and frequency  Over the next 120 days, patient will work with [facility], CCM team, pcp and specialist  to address needs related to urinary changes  Over the next 120 days, patient will demonstrate a decrease in urinary incontinence and frequency exacerbations as evidenced by plan of care to help with urinary needs  Over the next 120 days, patient will attend all scheduled medical appointments: specialist on 02/08/2020, pcp on 04-24-2020  Over the next 120 days, patient will verbalize basic understanding of urinary/kidney  disease process and self health management plan as evidenced by plan of care to help the patient with urinary incontinence and frequency  Interventions:   Inter-disciplinary care team collaboration (see longitudinal plan of care)  Evaluation of current treatment plan related to urinary incontinence and frequency and patient's adherence to plan as established by provider.  Advised patient to talk to specialist about changes in urinary habits.  The patient has been taking myrbetiq and the patient feels this has helped but the wife states he is still going to the bathroom a lot.   Provided education to patient re: Writing down questions to ask the provider at his next appointment on 02-08-2020.    Reviewed medications with patient and discussed Myrbetiq and  compliance. The patient has a supply of this medication to take for 30 days.   Collaborated with CCM pharmacist  regarding Myrbetiq, recent follow up with the patient and the patients wife by the pharmacist.   Discussed plans with patient for ongoing care management follow up and provided patient with direct contact information for care management team  Reviewed scheduled/upcoming provider appointments including: Specialist on 02-08-2020 and pcp on 04-24-2020  Patient Self Care Activities:   Patient verbalizes understanding of plan to work with the CCM team, pcp, and specialist in managing urinary incontinence and frequency.   Self administers medications as prescribed  Attends all scheduled provider appointments  Calls provider office for new concerns or questions  Unable to independently manage urinary incontinence and frequency   Please see past updates related to this goal by  clicking on the "Past Updates" button in the selected goal        RNCM: We doing better at watching his blood pressure and sugars        Current Barriers:   Chronic Disease Management support, education, and care coordination needs related to HTN, COPD, and DMII  Clinical Goal(s) related to  HTN, COPD, and DMII:  Over the next 120 days, patient will:   Work with the care management team to address educational, disease management, and care coordination needs   Begin or continue self health monitoring activities as directed today Measure and record cbg (blood glucose) 1 times daily, Measure and record blood pressure 5 times per week, Measure and record weight daily, and adhere to a heart healthy/ADA diet  Call provider office for new or worsened signs and symptoms Blood glucose findings outside established parameters, Blood pressure findings outside established parameters, Weight outside established parameters, Oxygen saturation lower than established parameter, Shortness of breath, and New or worsened symptom  related to Chronic health conditions  Call care management team with questions or concerns  Verbalize basic understanding of patient centered plan of care established today  Interventions related to  HTN, COPD, and DMII:  Evaluation of current treatment plans and patient's adherence to plan as established by provider.  01-20-2020: the patient states he feels he is doing very well right now with his chronic health conditions. Denies any acute distress. Sees providers regularly. The patient likes for the [facility] to talk to his wife. His wife states he is doing well.   Assessed patient understanding of disease states- the patient verbalized he does well and his wife keeps up with all the things he needs. 12-16-2019: The patient's wife states the patient is stable at this time. Denies any new concerns at this time. 01-20-2020: The patient  has a new concern with urinary incontinence and frequency.  See new goal for this.   Assessed patient's education and care coordination needs.  Ongoing support and education on maintaining a diet low in sodium and sugars. 12-16-2019: The patient is still waiting on his new glucose meter from [facility]. The patients wife said she thought it would be here by now. Advised the wife to call [facility] and inquire about shipment. The wife states that she has a tracking number. She also states that he sill is using his other meter but needs a battery for it. Review of watching for hypoglycemia. 03-06-2020: The patients blood sugars are ranging 145 to 156, this am it was 150.  The patient doesn't have the best appetite but the patient is eating and staying hydrated.  Denies any episodes of hypoglycemia.    Provided disease specific education to patient. 03-06-2020: Reminded the patient of the CCM team available to assist as needed and to continue to follow up with providers on a regular basis. Next appointment with the pcp on 04-24-2020.  The patients wife is interested in talking to the care  guides about meal delivery service and also about help with installation of rails in the home to help with safety. Care guide referral placed.   Collaborated with appropriate clinical care team members regarding patient needs: CCM pharmacist available and working with the patient also.   Evaluation of sx/sx of shortness of breath. The patient denies shortness of breath at this times. Does admit he has some when he is active. Feels he has improved overall. 12-16-2019: The patient saw his cancer doctor yesterday and got a good report. The  patient also received his COVID 19 booster shot. Vaccination record updated accordingly. The patients wife states he is doing fine and denies any side effects from the booster. 01-20-2020: The patient is stable with his COPD.  The wife states he still gets "short winded" but it is not worse than usual. The patients wife knows to call the provider for changes.   Patient Self Care Activities related to HTN, DMII, and COPD  Patient is unable to independently self-manage chronic health conditions  Please see past updates related to this goal by clicking on the "Past Updates" button in the selected goal         The patient verbalized understanding of instructions, educational materials, and care plan provided today and declined offer to receive copy of patient instructions, educational materials, and care plan.   Telephone follow up appointment with care management team member scheduled for: 03-16-2020   Noreene Larsson RN, MSN, [place] Mobile: [phone]

## 2020-03-06 NOTE — Chronic Care Management (AMB) (Signed)
Chronic Care Management   Follow Up Note   03/06/2020 Name: Lucas Caldwell MRN: [id number] DOB: 09/19/1933  Referred by: [name], DO Reason for referral : Chronic Care Management (Patient Phone Call)   Lucas Caldwell is a 86 y.o. year old male who is a primary care patient of [name], DO. The CCM team was consulted for assistance with chronic disease management and care coordination needs.    Receive a voicemail from patient's wife requesting a call back.  I reached out to Lucas Caldwell and his wife by phone today.   Review of patient status, including review of consultants reports, relevant laboratory and other test results, and collaboration with appropriate care team members and the patient's provider was performed as part of comprehensive patient evaluation and provision of chronic care management services.    SDOH (Social Determinants of Health) assessments performed: No See Care Plan activities for detailed interventions related to [facility])     Outpatient Encounter Medications as of 03/06/2020  Medication Sig Note  . acetaminophen (TYLENOL) 500 MG tablet Take 1,000 mg by mouth every 6 (six) hours as needed for moderate pain or headache.    . Alcohol Swabs (B-D SINGLE USE SWABS REGULAR) PADS Use to check blood sugar up to 2 times daily   . allopurinol (ZYLOPRIM) 100 MG tablet Take 1 tablet (100 mg total) by mouth daily.   [name] aspirin 81 MG chewable tablet Chew 81 mg by mouth daily.   . Blood Glucose Monitoring Suppl (TRUE METRIX METER) DEVI Use to check blood sugar up to 2 times daily   . bosutinib (BOSULIF) 100 MG tablet Take 100 mg by mouth at bedtime as needed (tired/not feeling well). Take with food.    . empagliflozin (JARDIANCE) 10 MG TABS tablet Take 1 tablet (10 mg total) by mouth daily before breakfast.   . gabapentin (NEURONTIN) 300 MG capsule TAKE TWO CAPSULES BY MOUTH TWICE DAILY. (MAX DOSE = 1200MG  DAILY FOR KIDNEY FUNCTION)    . hydrALAZINE (APRESOLINE) 25 MG tablet Take 1 tablet (25 mg total) by mouth 3 (three) times daily.   . Insulin Glargine ([name] KWIKPEN) 100 UNIT/ML Inject 22 Units into the skin daily.   . Insulin Pen Needle 31G X 5 MM MISC 31 g by Does not apply route as directed.   . isosorbide mononitrate (IMDUR) 60 MG 24 hr tablet Take 1 tablet (60 mg total) by mouth daily.   [name] levothyroxine (SYNTHROID) 25 MCG tablet TAKE ONE TABLET BY MOUTH EVERY MORNING BEFORE BREAKFAST   . loperamide (IMODIUM A-D) 2 MG tablet Take by mouth.    . losartan (COZAAR) 25 MG tablet Take 2 tablets (50 mg total) by mouth daily.   . Menthol-Zinc Oxide (GOLD BOND EX) Apply 1 application topically daily as needed (muscle pain).   . metoprolol succinate (TOPROL-XL) 25 MG 24 hr tablet TAKE ONE TABLET BY MOUTH ONCE DAILY   . mirabegron ER (MYRBETRIQ) 25 MG TB24 tablet Take 1 tablet (25 mg total) by mouth daily. From Urology   . Multiple Vitamin (MULTIVITAMIN WITH MINERALS) TABS tablet Take 1 tablet by mouth daily.   . mupirocin ointment (BACTROBAN) 2 % Apply 1 application topically 2 (two) times daily. For up to 2 weeks then stop. May repeat in future as needed, can use to help prevent infection of toe.   . nitroGLYCERIN (NITROSTAT) 0.4 MG SL tablet Place 1 tablet (0.4 mg total) under the tongue every 5 (five) minutes x 3  doses as needed for chest pain.   [name] omeprazole (PRILOSEC) 20 MG capsule Take 20 mg by mouth in the morning.    Lucas Caldwell (SYSTANE OP) Place 1 drop into both eyes 3 (three) times daily as needed (dry/irritated eyes.).    [name] polyethylene Caldwell (MIRALAX / GLYCOLAX) 17 g packet Take 17 g by mouth daily as needed for mild constipation.    . potassium chloride SA (KLOR-CON) 20 MEQ tablet Take 2 tablets (40 mEq total) by mouth daily.   . ranolazine (RANEXA) 500 MG 12 hr tablet Take 1 tablet (500 mg total) by mouth 2 (two) times daily.   [name] RELION TRUE METRIX TEST STRIPS test strip Use to check blood  sugar up to 2 times daily   . rosuvastatin (CRESTOR) 5 MG tablet TAKE ONE TABLET BY MOUTH ONCE DAILY   . simethicone (GAS-X) 80 MG chewable tablet Chew 80 mg by mouth every 6 (six) hours as needed for flatulence.   . tamsulosin (FLOMAX) 0.4 MG CAPS capsule Take 1 capsule (0.4 mg total) by mouth daily after supper.   . torsemide (DEMADEX) 20 MG tablet Take 3 tablets (60 mg total) by mouth daily.   . traMADol (ULTRAM) 50 MG tablet Take 1-2 tablets (50-100 mg total) by mouth at bedtime as needed (pain.).   [name] TRULICITY 1.5 ZO/1.0RU SOPN Inject 1.5 mg into the skin once a week. 02/04/2020: On Saturdays  . warfarin (COUMADIN) 3 MG tablet TAKE 1 TO 1 & 1/2 TABLETS BY MOUTH ONCE DAILY AS DIRECTED BY THE COUMADIN CLINIC    No facility-administered encounter medications on file as of 03/06/2020.    Goals Addressed            This Visit's Progress   . PharmD - Medication Managment       CARE PLAN ENTRY (see longitudinal plan of care for additional care plan information)  Current Barriers:  . Chronic Disease Management support, education, and care coordination needs related to type 2 diabetes, CKD, HFrEF s/p CardioMEMS, hyperlipidemia, atrial fibrillation, hypertension, coronary artery disease, CHF, hypothyroidism, and CML . Financial o Note patient has Partial Extra Help/LIS Subsidy o Patient enrolled in [employer] practitioner and [employer]) patient assistance program for 2022 calendar year o Patient denied for patient assistance for Lorenso Quarry from [employer] for 2022 calendar year . Limited vision  Pharmacist Clinical Goal(s):  [name] Over the next 30 days, patient will work with CM Pharmacist to address needs related to medication adherence and diabetes management  Interventions: . Receive voicemail from patient's wife requesting a call back. . Follow up with patient and wife today regarding questions around coordination of care . From review of chart, note that [place] has  attempted to reach patient regarding coupons for Glucerna.  o Mrs. Dockter reports that she has attempted to reach [place] as well, but will give her a call again today. . From review of chart, note [place] with [facility] has attempted to reach patient regarding providing samples of Jardiance o Encourage patient/wife to follow up with [facility]  . Patient/wife do not recall having signed up for [place] waitlist for heart failure fund. Attempt to complete this waitlist enrollment with patient and wife today, but error occurs on website.  o Will attempt to enroll patient on waitlist again during next telephone appointment  Patient Self Care Activities:  . Check blood sugar, blood pressure and daily weight as directed by providers  .  Patient takes medications, with assistance of wife, as directed o Mrs. Caddell manages patient's medications using weekly pillbox as adherence tool . Patient attends scheduled medical appointments  Please see past updates related to this goal by clicking on the "Past Updates" button in the selected goal         Plan  Telephone follow up appointment with care management team member scheduled for: 03/24/2020 at 10:30 am  [name], PharmD, [place] [phone]

## 2020-03-06 NOTE — Patient Instructions (Signed)
Thank you allowing the Chronic Care Management Team to be a part of your care! It was a pleasure speaking with you today!     CCM (Chronic Care Management) Team    Noreene Larsson RN, MSN, CCM Nurse Care Coordinator  [phone]   [name] PharmD  Clinical Pharmacist  [phone]   Eula Fried LCSW Clinical Social Worker [phone]  Visit Information  Goals Addressed            This Visit's Progress   . PharmD - Medication Managment       CARE PLAN ENTRY (see longitudinal plan of care for additional care plan information)  Current Barriers:  . Chronic Disease Management support, education, and care coordination needs related to type 2 diabetes, CKD, HFrEF s/p CardioMEMS, hyperlipidemia, atrial fibrillation, hypertension, coronary artery disease, CHF, hypothyroidism, and CML . Financial o Note patient has Partial Extra Help/LIS Subsidy o Patient enrolled in [place] [employer] health practitioner and [employer]) patient assistance program for 2022 calendar year o Patient denied for patient assistance for Lorenso Quarry from [employer] for 2022 calendar year . Limited vision  Pharmacist Clinical Goal(s):  [name] Over the next 30 days, patient will work with CM Pharmacist to address needs related to medication adherence and diabetes management  Interventions: . Receive voicemail from patient's wife requesting a call back. . Follow up with patient and wife today regarding questions around coordination of care . From review of chart, note that [place] has attempted to reach patient regarding coupons for Glucerna.  o Mrs. Train reports that she has attempted to reach [place] as well, but will give her a call again today. . From review of chart, note [place] with [facility] has attempted to reach patient regarding providing samples of Jardiance o Encourage patient/wife to follow up with [facility]  . Patient/wife do not  recall having signed up for [place] waitlist for heart failure fund. Attempt to complete this waitlist enrollment with patient and wife today, but error occurs on website.  o Will attempt to enroll patient on waitlist again during next telephone appointment  Patient Self Care Activities:  . Check blood sugar, blood pressure and daily weight as directed by providers  . Patient takes medications, with assistance of wife, as directed o Mrs. Sanon manages patient's medications using weekly pillbox as adherence tool . Patient attends scheduled medical appointments  Please see past updates related to this goal by clicking on the "Past Updates" button in the selected goal         The patient verbalized understanding of instructions, educational materials, and care plan provided today and declined offer to receive copy of patient instructions, educational materials, and care plan.   Telephone follow up appointment with care management team member scheduled for: 03/24/2020 at 10:30 am  [name], PharmD, [place] [phone]

## 2020-03-07 ENCOUNTER — Telehealth: Payer: Self-pay

## 2020-03-07 NOTE — Telephone Encounter (Signed)
    MA12/21/2021 1st Attempt  Name: Lucas Caldwell   MRN: [id number]   DOB: 10-Mar-1934   AGE: 86 y.o.   GENDER: male   PCP [name], DO.   Referral Reason: Food Insecurity and Home Modifications  Interventions: Successful outbound call placed to the patient Patient provided with information about care guide support team and interviewed to confirm resource needs Investigation of community resources performed Obtained verbal consent to place patient referral to [place] referral to [facility] via email  Follow up plan: Care guide will follow up with patient by phone over the next 7 days   Magdaleno Lortie, AAS Paralegal, [place] . Embedded Care Coordination [facility]  Care Management  [address], [place] [address] [email or web address]  [phone]   [email or web address]

## 2020-03-08 ENCOUNTER — Telehealth: Payer: Self-pay

## 2020-03-08 ENCOUNTER — Telehealth: Payer: Self-pay | Admitting: Family Medicine

## 2020-03-08 NOTE — Telephone Encounter (Signed)
    MA12/22/2021   Name: Lucas Caldwell   MRN: [id number]   DOB: 04/06/1933   AGE: 86 y.o.   GENDER: male   PCP [name], DO.   Referral Reason: Food Insecurity and Home Modifications  Interventions: Unsuccessful outbound call placed to the patient. Follow up call placed to the patient to discuss status of referral Unable to leave message home voicemail not set-up and cell voicemail is full. Received email confirmation from Lincoln Maxin at [employer] will start for both patient and spouse on Tuesday, December 28th. Received email confirmation form Hollice Gong at [employer] referral has been received and the [facility] will be contacted in the next few weeks for intake.  Follow up plan: Care guide will follow up with patient by phone over the next 7 days     Magie Ciampa, AAS Paralegal, [employer] . Embedded Care Coordination [facility]  Care Management  [address], [place] [address] [email or web address]  [phone]   [email or web address]

## 2020-03-08 NOTE — Telephone Encounter (Signed)
Lucas Caldwell 03/08/2020 Pt called regarding community resource referral received. My info is [phone] please see ref notes for more details. Thank you.  [place], Care Management \

## 2020-03-14 ENCOUNTER — Telehealth: Payer: Self-pay

## 2020-03-14 NOTE — Telephone Encounter (Signed)
    MA12/28/2021  Name: AIKEEM LILLEY   MRN: [id number]   DOB: May 21, 1933   AGE: 86 y.o.   GENDER: male   PCP [name], DO.   Referral Reason: Food Insecurity and Home Modifications  Interventions: Unsuccessful outbound call placed to the patient. A HIPAA compliant voice message was left requesting a return call. Called to make sure MOW started today and let patient know that [facility] has received their referral and will be contacting them in the next few weeks.  Follow up plan: Care guide will follow up with patient by phone over the next 7 days.     Rayleen Wyrick, AAS Paralegal, [place] . Embedded Care Coordination [facility]  Care Management  [address], [place] [address] [email or web address]  [phone]   [email or web address]

## 2020-03-15 ENCOUNTER — Telehealth: Payer: Self-pay

## 2020-03-15 NOTE — Telephone Encounter (Signed)
    MA12/29/2021   Name: Lucas Caldwell   MRN: [id number]   DOB: July 06, 1933   AGE: 86 y.o.   GENDER: male   PCP [name], DO.   Referral Reason: Food Insecurity and Home Modifications  Interventions: Successful outbound call placed to the patient Follow up call placed to the patient to discuss status of referral Patient and spouse have received food delivery from [facility] and they are aware that they will receive a call from Hollice Gong at [facility]   Follow up plan: No further follow up planned at this time. The patient has been provided with needed resources. and closing referral      Tache Bobst, AAS Paralegal, [place] . Embedded Care Coordination [facility]  Care Management  [address], [place] [address] [email or web address]  [phone]   [email or web address]

## 2020-03-16 ENCOUNTER — Telehealth: Payer: Self-pay | Admitting: Internal Medicine

## 2020-03-16 ENCOUNTER — Telehealth: Payer: Medicare HMO

## 2020-03-16 NOTE — Telephone Encounter (Signed)
Wife is calling to check the status of patient's sleep study appointment

## 2020-03-16 NOTE — Telephone Encounter (Signed)
Spoke with patients wife per release form. She states that he was scheduled about a month ago over at [facility] but had to cancel due to them not being able to drive at night. Advised that I would send over to Dr. Clayborne Dana nurse and she wants you to call her back on her cell phone. She wanted to see if he could be rescheduled here in [place]. She states that she knows several people who have had it done here. Let her know that I would have them call her back. She verbalized understanding with no further questions at this time.

## 2020-03-20 ENCOUNTER — Telehealth (HOSPITAL_COMMUNITY): Payer: Self-pay | Admitting: Licensed Clinical Social Worker

## 2020-03-20 ENCOUNTER — Telehealth: Payer: Self-pay | Admitting: Internal Medicine

## 2020-03-20 NOTE — Telephone Encounter (Signed)
Spoke to patient's wife, ok per DPR. Let her know we had Jardiance samples. She was appreciative and will come by in the next day or so to pick them up as it is snowing right now. Medication Samples have been left at front desk for patient or wife to pick up.  Drug name: Jardiance       Strength: 10 mg        Qty: 2 boxes  LOT: [id number]  Exp.Date: Aug2023

## 2020-03-20 NOTE — Telephone Encounter (Signed)
Patient calling the office for samples of medication:   1.  What medication and dosage are you requesting samples for? Jardiance 10 MG  2.  Are you currently out of this medication? Just about - maybe 1 left

## 2020-03-20 NOTE — Telephone Encounter (Signed)
CSW found samples of jardiance for pt from 12/17 at front desk and called pt to get update regarding his medication status to see if they still need samples at this time.  CSW spoke with pt wife who states they will run out of jardiance this week- still hoping that [employer] foundation will open up soon so they can get assistance with copay.  Needs samples but lives in [place] and unsure when they could make it here- will plan to call their local cardiologist to discuss samples and call CSW back if they still need to come get samples.  Will continue to follow and assist as needed  Jorge Ny, [facility] Desk#: [phone] Cell#: [phone]

## 2020-03-21 ENCOUNTER — Other Ambulatory Visit: Payer: Self-pay | Admitting: Family Medicine

## 2020-03-22 DIAGNOSIS — C9210 Chronic myeloid leukemia, BCR/ABL-positive, not having achieved remission: Principal | ICD-10-CM

## 2020-03-22 NOTE — Telephone Encounter (Signed)
Order, demographics, and last OV note all faxed to [facility] at [facility] at [phone]

## 2020-03-23 ENCOUNTER — Ambulatory Visit
Admit: 2020-03-23 | Discharge: 2020-03-24 | Payer: MEDICARE | Attending: Hematology & Oncology | Primary: Hematology & Oncology

## 2020-03-23 ENCOUNTER — Other Ambulatory Visit: Admit: 2020-03-23 | Discharge: 2020-03-24 | Payer: MEDICARE

## 2020-03-23 DIAGNOSIS — C9210 Chronic myeloid leukemia, BCR/ABL-positive, not having achieved remission: Principal | ICD-10-CM

## 2020-03-23 MED ORDER — BOSUTINIB 100 MG TABLET
ORAL_TABLET | Freq: Every day | ORAL | 11 refills | 30 days | Status: CP
Start: 2020-03-23 — End: ?

## 2020-03-24 ENCOUNTER — Ambulatory Visit: Payer: Medicare HMO | Admitting: Pharmacist

## 2020-03-24 DIAGNOSIS — I10 Essential (primary) hypertension: Secondary | ICD-10-CM

## 2020-03-24 DIAGNOSIS — E1121 Type 2 diabetes mellitus with diabetic nephropathy: Secondary | ICD-10-CM

## 2020-03-24 DIAGNOSIS — Z794 Long term (current) use of insulin: Secondary | ICD-10-CM

## 2020-03-24 NOTE — Patient Instructions (Signed)
Thank you allowing the Chronic Care Management Team to be a part of your care! It was a pleasure speaking with you today!     CCM (Chronic Care Management) Team    Noreene Larsson RN, MSN, CCM Nurse Care Coordinator  [phone]   [name] PharmD  Clinical Pharmacist  [phone]   Eula Fried LCSW Clinical Social Worker [phone]  Visit Information  Goals Addressed            This Visit's Progress   . PharmD - Medication Managment       CARE PLAN ENTRY (see longitudinal plan of care for additional care plan information)  Current Barriers:  . Chronic Disease Management support, education, and care coordination needs related to type 2 diabetes, CKD, HFrEF s/p CardioMEMS, hyperlipidemia, atrial fibrillation, hypertension, coronary artery disease, CHF, hypothyroidism, and CML . Financial o Note patient has Partial Extra Help/LIS Subsidy o Patient enrolled in [place] [employer] health practitioner and Trulicity) patient assistance program for 2022 calendar year o Patient denied for patient assistance for Lorenso Quarry from [employer] for 2022 calendar year . Limited vision  Pharmacist Clinical Goal(s):  [name] Over the next 30 days, patient will work with CM Pharmacist to address needs related to medication adherence and diabetes management  Interventions: . Discuss importance of continuing home monitoring of BP and daily weights, bringing these records with him to medical appointments and follow up with Cardiology for readings outside of established parameters  Type 2 Diabetes . Reports patient taking: o [name] 20 untis daily o Trulicity 1.5 mg once weekly on Saturdays o Jardiance 10 mg once daily before breakfast . Patient reports recent AM CBGs readings  AM Blood Sugar  1 - January 173  2 - January 151  3 - January 152  4 - January 147  5 - January 150  6 - January 166  7 - January 126  Average 152   . Reports patient's appetite remains about the same as prior to  dose increase of Trulicity. Reports patient is drinking Glucerna supplement daily as recommended by PCP ? Confirms received Glucerna coupon as sent by Care Guide ? Note patient also now signed up for Meals on Wheels with assistance from [place] and receiving meals 5 days/week  Medication Assistance . Note patient was denied for [employer] patient assistance for Jardiance for 2022 calendar year based on copayment amount/household income . Confirms received final 1 month supply of Jardiance from 2021 enrollment in assistance program . Note CPP with [facility] Failure Clinic has advised clinic will provide patient with samples of Jardiance and while patient on [place] waitlist for heart failure fund.  o [name] aware of samples available from HF Clinic in [place]; plans to pick up samples from [facility] in [place] in the meantime. . Complete enrollment for [place] waitlist for heart failure fund for patient with patient and wife today.   Spouse denies further medication questions/concerns on behalf of patient.    Patient Self Care Activities:  . Check blood sugar, blood pressure and daily weight as directed by providers  . Patient takes medications, with assistance of wife, as directed o Mrs. Prohaska manages patient's medications using weekly pillbox as adherence tool . Patient attends scheduled medical appointments o Next appointment with Cardiology on 04/12/2020 o Next appointment with PCP on 04/24/2020  Please see past updates related to this goal by clicking on the "Past Updates" button in the selected goal         The  patient verbalized understanding of instructions, educational materials, and care plan provided today and declined offer to receive copy of patient instructions, educational materials, and care plan.   Telephone follow up appointment with care management team member scheduled for: 05/12/2020 at 10 am  [name], PharmD,  [place] [phone]

## 2020-03-24 NOTE — Chronic Care Management (AMB) (Signed)
Chronic Care Management   Follow Up Note   03/24/2020 Name: Lucas Caldwell MRN: [id number] DOB: Apr 12, 1933  Referred by: [name], DO Reason for referral : Chronic Care Management (Patient Phone Call)   Lucas Caldwell is a 87 y.o. year old male who is a primary care patient of [name], DO. The CCM team was consulted for assistance with chronic disease management and care coordination needs.    I reached out to Lucas Caldwell and his wife by phone today.   Review of patient status, including review of consultants reports, relevant laboratory and other test results, and collaboration with appropriate care team members and the patient's provider was performed as part of comprehensive patient evaluation and provision of chronic care management services.    SDOH (Social Determinants of Health) assessments performed: No See Care Plan activities for detailed interventions related to [facility])     Outpatient Encounter Medications as of 03/24/2020  Medication Sig Note  . empagliflozin (JARDIANCE) 10 MG TABS tablet Take 1 tablet (10 mg total) by mouth daily before breakfast.   . Insulin Glargine ([name] KWIKPEN) 100 UNIT/ML Inject 20 Units into the skin daily.   . TRULICITY 1.5 KJ/1.7HX SOPN Inject 1.5 mg into the skin once a week. 02/04/2020: On Saturdays  . acetaminophen (TYLENOL) 500 MG tablet Take 1,000 mg by mouth every 6 (six) hours as needed for moderate pain or headache.    . Alcohol Swabs (B-D SINGLE USE SWABS REGULAR) PADS Use to check blood sugar up to 2 times daily   . allopurinol (ZYLOPRIM) 100 MG tablet Take 1 tablet (100 mg total) by mouth daily.   [name] aspirin 81 MG chewable tablet Chew 81 mg by mouth daily.   . Blood Glucose Monitoring Suppl (TRUE METRIX METER) DEVI Use to check blood sugar up to 2 times daily   . bosutinib (BOSULIF) 100 MG tablet Take 100 mg by mouth at bedtime as needed (tired/not feeling well). Take with food.    [name] EASY  TOUCH PEN NEEDLES 31G X 8 MM MISC USE 1 NEEDLE DAILY WITH LEVEMIR   . gabapentin (NEURONTIN) 300 MG capsule TAKE TWO CAPSULES BY MOUTH TWICE DAILY. (MAX DOSE = 1200MG  DAILY FOR KIDNEY FUNCTION)   . hydrALAZINE (APRESOLINE) 25 MG tablet Take 1 tablet (25 mg total) by mouth 3 (three) times daily.   . Insulin Pen Needle 31G X 5 MM MISC 31 g by Does not apply route as directed.   . isosorbide mononitrate (IMDUR) 60 MG 24 hr tablet Take 1 tablet (60 mg total) by mouth daily.   [name] levothyroxine (SYNTHROID) 25 MCG tablet TAKE ONE TABLET BY MOUTH EVERY MORNING BEFORE BREAKFAST   . loperamide (IMODIUM A-D) 2 MG tablet Take by mouth.    . losartan (COZAAR) 25 MG tablet Take 2 tablets (50 mg total) by mouth daily.   . Menthol-Zinc Oxide (GOLD BOND EX) Apply 1 application topically daily as needed (muscle pain).   . metoprolol succinate (TOPROL-XL) 25 MG 24 hr tablet TAKE ONE TABLET BY MOUTH ONCE DAILY   . mirabegron ER (MYRBETRIQ) 25 MG TB24 tablet Take 1 tablet (25 mg total) by mouth daily. From Urology   . Multiple Vitamin (MULTIVITAMIN WITH MINERALS) TABS tablet Take 1 tablet by mouth daily.   . mupirocin ointment (BACTROBAN) 2 % Apply 1 application topically 2 (two) times daily. For up to 2 weeks then stop. May repeat in future as needed, can use to help prevent infection of  toe.   . nitroGLYCERIN (NITROSTAT) 0.4 MG SL tablet Place 1 tablet (0.4 mg total) under the tongue every 5 (five) minutes x 3 doses as needed for chest pain.   [name] omeprazole (PRILOSEC) 20 MG capsule Take 20 mg by mouth in the morning.    [name] Place 1 drop into both eyes 3 (three) times daily as needed (dry/irritated eyes.).    [name] polyethylene glycol (MIRALAX / GLYCOLAX) 17 g packet Take 17 g by mouth daily as needed for mild constipation.    . potassium chloride SA (KLOR-CON) 20 MEQ tablet Take 2 tablets (40 mEq total) by mouth daily.   . ranolazine (RANEXA) 500 MG 12 hr tablet Take 1 tablet (500  mg total) by mouth 2 (two) times daily.   [name] RELION TRUE METRIX TEST STRIPS test strip Use to check blood sugar up to 2 times daily   . rosuvastatin (CRESTOR) 5 MG tablet TAKE ONE TABLET BY MOUTH ONCE DAILY   . simethicone (GAS-X) 80 MG chewable tablet Chew 80 mg by mouth every 6 (six) hours as needed for flatulence.   . tamsulosin (FLOMAX) 0.4 MG CAPS capsule Take 1 capsule (0.4 mg total) by mouth daily after supper.   . torsemide (DEMADEX) 20 MG tablet Take 3 tablets (60 mg total) by mouth daily.   . traMADol (ULTRAM) 50 MG tablet Take 1-2 tablets (50-100 mg total) by mouth at bedtime as needed (pain.).   [name] warfarin (COUMADIN) 3 MG tablet TAKE 1 TO 1 & 1/2 TABLETS BY MOUTH ONCE DAILY AS DIRECTED BY THE COUMADIN CLINIC    No facility-administered encounter medications on file as of 03/24/2020.    Goals Addressed            This Visit's Progress   . PharmD - Medication Managment       CARE PLAN ENTRY (see longitudinal plan of care for additional care plan information)  Current Barriers:  . Chronic Disease Management support, education, and care coordination needs related to type 2 diabetes, CKD, HFrEF s/p CardioMEMS, hyperlipidemia, atrial fibrillation, hypertension, coronary artery disease, CHF, hypothyroidism, and CML . Financial o Note patient has Partial Extra Help/LIS Subsidy o Patient enrolled in [place] Environmental health practitioner and Trulicity) patient assistance program for 2022 calendar year o Patient denied for patient assistance for Lorenso Quarry from [employer] for 2022 calendar year . Limited vision  Pharmacist Clinical Goal(s):  [name] Over the next 30 days, patient will work with CM Pharmacist to address needs related to medication adherence and diabetes management  Interventions: . Discuss importance of continuing home monitoring of BP and daily weights, bringing these records with him to medical appointments and follow up with Cardiology for readings outside of established  parameters  Type 2 Diabetes . Reports patient taking: o [name] 20 untis daily o Trulicity 1.5 mg once weekly on Saturdays o Jardiance 10 mg once daily before breakfast . Patient reports recent AM CBGs readings  AM Blood Sugar  1 - January 173  2 - January 151  3 - January 152  4 - January 147  5 - January 150  6 - January 166  7 - January 126  Average 152   . Reports patient's appetite remains about the same as prior to dose increase of Trulicity. Reports patient is drinking Glucerna supplement daily as recommended by PCP ? Confirms received Glucerna coupon as sent by Care Guide ? Note patient also now signed up for Meals on Wheels with assistance from  Care Guide and receiving meals 5 days/week  Medication Assistance . Note patient was denied for [employer] patient assistance for Jardiance for 2022 calendar year based on copayment amount/household income . Confirms received final 1 month supply of Jardiance from 2021 enrollment in assistance program . Note CPP with [facility] Failure Clinic has advised clinic will provide patient with samples of Jardiance and while patient on [place] waitlist for heart failure fund.  o [name] aware of samples available from HF Clinic in [place]; plans to pick up samples from [facility] in [place] in the meantime. . Complete enrollment for [place] waitlist for heart failure fund for patient with patient and wife today.   Spouse denies further medication questions/concerns on behalf of patient.    Patient Self Care Activities:  . Check blood sugar, blood pressure and daily weight as directed by providers  . Patient takes medications, with assistance of wife, as directed o Mrs. Bateson manages patient's medications using weekly pillbox as adherence tool . Patient attends scheduled medical appointments o Next appointment with Cardiology on 04/12/2020 o Next appointment with PCP on 04/24/2020  Please see past  updates related to this goal by clicking on the "Past Updates" button in the selected goal         Plan  Telephone follow up appointment with care management team member scheduled for: 05/12/2020 at 10 am  [name], PharmD, [place] [phone]

## 2020-03-29 ENCOUNTER — Other Ambulatory Visit: Payer: Self-pay | Admitting: Family Medicine

## 2020-03-29 ENCOUNTER — Other Ambulatory Visit: Payer: Self-pay | Admitting: Internal Medicine

## 2020-03-29 ENCOUNTER — Ambulatory Visit (INDEPENDENT_AMBULATORY_CARE_PROVIDER_SITE_OTHER): Payer: Medicare HMO

## 2020-03-29 ENCOUNTER — Other Ambulatory Visit: Payer: Self-pay

## 2020-03-29 DIAGNOSIS — Z7901 Long term (current) use of anticoagulants: Secondary | ICD-10-CM | POA: Diagnosis not present

## 2020-03-29 DIAGNOSIS — I480 Paroxysmal atrial fibrillation: Secondary | ICD-10-CM

## 2020-03-29 DIAGNOSIS — E1142 Type 2 diabetes mellitus with diabetic polyneuropathy: Secondary | ICD-10-CM

## 2020-03-29 LAB — POCT INR: INR: 2.3 (ref 2.0–3.0)

## 2020-03-29 NOTE — Patient Instructions (Signed)
-   continue warfarin of 1 TABLET EVERY DAY EXCEPT 1.5 TABLETS ON MONDAYS, [place]. - recheck INR in 6 weeks.

## 2020-03-30 ENCOUNTER — Other Ambulatory Visit: Payer: Self-pay | Admitting: Family Medicine

## 2020-03-30 DIAGNOSIS — E1142 Type 2 diabetes mellitus with diabetic polyneuropathy: Secondary | ICD-10-CM

## 2020-03-30 NOTE — Telephone Encounter (Signed)
Requested medication (s) are due for refill today- yes  Requested medication (s) are on the active medication list -yes  Future visit scheduled -yes  Last refill: 02/25/20  Notes to clinic: Request for RF non delegated rx  Requested Prescriptions  Pending Prescriptions Disp Refills   traMADol (ULTRAM) 50 MG tablet [Pharmacy Med Name: tramadol 50 mg tablet] 30 tablet 2    Sig: TAKE ONE TO TWO TABLETS BY MOUTH AT BEDTIME AS NEEDED      Not Delegated - Analgesics:  Opioid Agonists Failed - 03/30/2020 10:18 AM      Failed - This refill cannot be delegated      Failed - Urine Drug Screen completed in last 360 days      Passed - Valid encounter within last 6 months    Recent Outpatient Visits           1 month ago Diabetic polyneuropathy associated with type 2 diabetes mellitus ([place])   [facility] [name], DO   2 months ago Constipation, unspecified constipation type   [place], DO   3 months ago Controlled type 2 diabetes mellitus with diabetic nephropathy, with long-term current use of insulin ([place])   [facility], [name], DO   4 months ago Controlled type 2 diabetes mellitus with diabetic nephropathy, with long-term current use of insulin ([place])   [facility], [name], DO   6 months ago Controlled type 2 diabetes mellitus with diabetic nephropathy, with long-term current use of insulin ([place])   [place], [name], DO       Future Appointments             In 1 week Debroah Loop, PA-C [facility]   In 1 week [place], Areta Haber, PA-C [facility], [facility]   In 3 weeks [place], [name], DO [facility], [facility]                 Requested Prescriptions  Pending Prescriptions Disp Refills   traMADol (ULTRAM) 50 MG tablet [Pharmacy Med Name:  tramadol 50 mg tablet] 30 tablet 2    Sig: TAKE ONE TO TWO TABLETS BY MOUTH AT BEDTIME AS NEEDED      Not Delegated - Analgesics:  Opioid Agonists Failed - 03/30/2020 10:18 AM      Failed - This refill cannot be delegated      Failed - Urine Drug Screen completed in last 360 days      Passed - Valid encounter within last 6 months    Recent Outpatient Visits           1 month ago Diabetic polyneuropathy associated with type 2 diabetes mellitus ([place])   [facility] [name], DO   2 months ago Constipation, unspecified constipation type   [place], DO   3 months ago Controlled type 2 diabetes mellitus with diabetic nephropathy, with long-term current use of insulin [facility])   [place], DO   4 months ago Controlled type 2 diabetes mellitus with diabetic nephropathy, with long-term current use of insulin [facility])   [place], DO   6 months ago Controlled type 2 diabetes mellitus with diabetic nephropathy, with long-term current use of insulin [facility])   [place], [name], DO  Future Appointments             In 1 week Debroah Loop, PA-C [facility]   In 1 week Dunn, Areta Haber, PA-C [facility], [place]   In 3 weeks [place], [name], DO [facility], [facility]

## 2020-04-06 ENCOUNTER — Ambulatory Visit: Payer: Self-pay | Admitting: General Practice

## 2020-04-06 ENCOUNTER — Encounter: Payer: Medicare HMO | Admitting: General Practice

## 2020-04-06 DIAGNOSIS — I10 Essential (primary) hypertension: Secondary | ICD-10-CM

## 2020-04-06 DIAGNOSIS — Z794 Long term (current) use of insulin: Secondary | ICD-10-CM

## 2020-04-06 DIAGNOSIS — E1121 Type 2 diabetes mellitus with diabetic nephropathy: Secondary | ICD-10-CM

## 2020-04-06 DIAGNOSIS — I5032 Chronic diastolic (congestive) heart failure: Secondary | ICD-10-CM

## 2020-04-06 NOTE — Patient Instructions (Signed)
Visit Information  Goals Addressed              This Visit's Progress   .  COMPLETED: RN- I am getting more short winded but I feel better now (pt-stated)        Current Barriers: Closing this goal and opening in new ELS . Knowledge deficit related to basic heart failure pathophysiology and self care management   Case Manager Clinical Goal(s):  [name] Over the next 120 days, patient will verbalize understanding of Heart Failure Action Plan and when to call doctor . Over the next 120 days, patient will take all Heart Failure mediations as prescribed . Over the next 120 days, patient will weigh daily and record (notifying MD of 3 lb weight gain over night or 5 lb in a week) . Over the next 120 days, patient will take blood pressure daily and record, notifying the provider for blood pressure outside of set parameters  Interventions:  . Basic overview and discussion of pathophysiology of Heart Failure reviewed  . Provided verbal education on low sodium diet, per the patient wife he is doing better with dietary restrictions. The patients wife continues to monitor and help patient  . Discussed importance of daily weight and advised patient to weigh and record daily: patient is now weighing daily and recording, weight on 8-4 was 203.  The patients wife was not at home and the patient states that she keeps up with all of that "stuff" for him. 12-16-2019: the patient has a "pillow" that he gets on each day and it measures his weight and sends it the the cardiologist.  If he has extra fluid on board it alerts the provider and they call the patient and give instructions. The patients wife states he does this every day and they have not called but they do get the readings. It is a new thing they are doing and it is working well for him.  [name] taking "3" fluid pills a day. The wife helps the patient keep up with his medications, appointments, and daily routines. 01-20-2020: The patient continues to use the  pillow daily and his weight is staying 202 to 205 range. They call if the weight is out of range. The patients wife states that they have not received a call. She called to makes sure it was working right. The patient wife states they told her everything was fine and that if it was not accurate they would receive a call. Education and support given.  . Reviewed role of diuretics in prevention of fluid overload and management of heart failure.  His diuretic regimen is working well for him at this time. 01-20-2020: Review education and support.   . Discussed s/s of HF and increased fluid. Patient verbalized that he is feeling much better and went today to have a "test".  (ECHO- 04/22/2019 with an EF% of 30-35%) 03-06-2020: The patient is stable at this time. His wife verbalized he will follow up with the cardiologist next month. No changes in plan of care at this time. Has been working with the CCM pharmacist to understand medications and remain compliant. . Evaluation of oxygen needs: Patient reports using 02 at night successfully. The patient expressed he usually wakes up in the middle of the night and the oxygen has dried his nose out. He usually takes it off then and goes back to sleep without incident.  The patient does use oxygen during the day if he needs it. No acute distress  noted. . Discussed the importance of [place] and the patient states PT and a nurse are coming into the home to check on him regularly. The patient feels he is improving. [name] ejection fraction: 30-35% 04-22-2019   Wt Readings from Last 3 Encounters:  01/06/20 205 lb (93 kg)  01/05/20 205 lb (93 kg)  12/22/19 207 lb (93.9 kg)     BP Readings from Last 3 Encounters:  01/06/20 122/72  01/05/20 128/80  12/22/19 121/61             Patient Self Care Activities:  . Takes Heart Failure Medications as prescribed . Weighs daily and record (notifying MD of 3 lb weight gain over night or 5 lb in a week) . Verbalizes understanding of and  follows CHF Action Plan . Adheres to low sodium diet  Please see past updates related to this goal by clicking on the "Past Updates" button in the selected goal      .  COMPLETED: RNCM: Pt's wife: "[name] still going to the bathroom alot"        [place] (see longitudinal plan of care for additional care plan information)  Current Barriers: Closing this goal and opening new goals in ELS . Knowledge Deficits related to urinary incontinence and frequency . Chronic Disease Management support and education needs related to urinary incontinence and frequency . Lacks caregiver support.   Nurse Case Manager Clinical Goal(s):  [name] Over the next 120 days, patient will verbalize understanding of plan for urinary incontinence and frequency . Over the next 120 days, patient will work with [facility] [place] team, pcp and specialist  to address needs related to urinary changes . Over the next 120 days, patient will demonstrate a decrease in urinary incontinence and frequency exacerbations as evidenced by plan of care to help with urinary needs . Over the next 120 days, patient will attend all scheduled medical appointments: specialist on 02/08/2020, pcp on 04-24-2020 . Over the next 120 days, patient will verbalize basic understanding of urinary/kidney  disease process and self health management plan as evidenced by plan of care to help the patient with urinary incontinence and frequency  Interventions:  . Inter-disciplinary care team collaboration (see longitudinal plan of care) . Evaluation of current treatment plan related to urinary incontinence and frequency and patient's adherence to plan as established by provider. . Advised patient to talk to specialist about changes in urinary habits.  The patient has been taking myrbetiq and the patient feels this has helped but the wife states he is still going to the bathroom a lot.  . Provided education to patient re: Writing down questions to ask the provider at  his next appointment on 02-08-2020.   [name] Reviewed medications with patient and discussed Myrbetiq and compliance. The patient has a supply of this medication to take for 30 days.  Nash Dimmer with CCM pharmacist  regarding Myrbetiq, recent follow up with the patient and the patients wife by the pharmacist.  . Discussed plans with patient for ongoing care management follow up and provided patient with direct contact information for care management team . Reviewed scheduled/upcoming provider appointments including: Specialist on 02-08-2020 and pcp on 04-24-2020  Patient Self Care Activities:  . Patient verbalizes understanding of plan to work with the CCM team, pcp, and specialist in managing urinary incontinence and frequency.  . Self administers medications as prescribed . Attends all scheduled provider appointments . Calls provider office for new concerns or questions . Unable to  independently manage urinary incontinence and frequency   Please see past updates related to this goal by clicking on the "Past Updates" button in the selected goal      .  COMPLETED: RNCM: We doing better at watching his blood pressure and sugars        Current Barriers: Closing this goal and opening in new ELS . Chronic Disease Management support, education, and care coordination needs related to HTN, COPD, and DMII  Clinical Goal(s) related to  HTN, COPD, and DMII:  Over the next 120 days, patient will:  . Work with the care management team to address educational, disease management, and care coordination needs  . Begin or continue self health monitoring activities as directed today Measure and record cbg (blood glucose) 1 times daily, Measure and record blood pressure 5 times per week, Measure and record weight daily, and adhere to a heart healthy/ADA diet . Call provider office for new or worsened signs and symptoms Blood glucose findings outside established parameters, Blood pressure findings outside  established parameters, Weight outside established parameters, Oxygen saturation lower than established parameter, Shortness of breath, and New or worsened symptom related to Chronic health conditions . Call care management team with questions or concerns . Verbalize basic understanding of patient centered plan of care established today  Interventions related to  HTN, COPD, and DMII: . Evaluation of current treatment plans and patient's adherence to plan as established by provider.  01-20-2020: the patient states he feels he is doing very well right now with his chronic health conditions. Denies any acute distress. Sees providers regularly. The patient likes for the [facility] to talk to his wife. His wife states he is doing well.  . Assessed patient understanding of disease states- the patient verbalized he does well and his wife keeps up with all the things he needs. 12-16-2019: The patient's wife states the patient is stable at this time. Denies any new concerns at this time. 01-20-2020: The patient  has a new concern with urinary incontinence and frequency.  See new goal for this.  . Assessed patient's education and care coordination needs.  Ongoing support and education on maintaining a diet low in sodium and sugars. 12-16-2019: The patient is still waiting on his new glucose meter from [facility]. The patients wife said she thought it would be here by now. Advised the wife to call [facility] and inquire about shipment. The wife states that she has a tracking number. She also states that he sill is using his other meter but needs a battery for it. Review of watching for hypoglycemia. 03-06-2020: The patients blood sugars are ranging 145 to 156, this am it was 150.  The patient doesn't have the best appetite but the patient is eating and staying hydrated.  Denies any episodes of hypoglycemia.   . Provided disease specific education to patient. 03-06-2020: Reminded the patient of the CCM team available to assist as needed  and to continue to follow up with providers on a regular basis. Next appointment with the pcp on 04-24-2020.  The patients wife is interested in talking to the care guides about meal delivery service and also about help with installation of rails in the home to help with safety. Care guide referral placed.  Nash Dimmer with appropriate clinical care team members regarding patient needs: CCM pharmacist available and working with the patient also.  . Evaluation of sx/sx of shortness of breath. The patient denies shortness of breath at this times. Does admit he  has some when he is active. Feels he has improved overall. 12-16-2019: The patient saw his cancer doctor yesterday and got a good report. The patient also received his COVID 19 booster shot. Vaccination record updated accordingly. The patients wife states he is doing fine and denies any side effects from the booster. 01-20-2020: The patient is stable with his COPD.  The wife states he still gets "short winded" but it is not worse than usual. The patients wife knows to call the provider for changes.   Patient Self Care Activities related to HTN, DMII, and COPD . Patient is unable to independently self-manage chronic health conditions  Please see past updates related to this goal by clicking on the "Past Updates" button in the selected goal         Patient verbalizes understanding of instructions provided today.  Face to Face appointment with care management team member scheduled for: 04-24-2020 at 0830 am  Noreene Larsson RN, MSN,  [place] Mobile: [phone]

## 2020-04-06 NOTE — Progress Notes (Deleted)
Cardiology Office Note    Date:  04/06/2020   ID:  Lucas Caldwell, Lucas Caldwell 1933-11-19, MRN [id number]  PCP:  [name], DO  Cardiologist:  [name], MD  Electrophysiologist:  None  Advanced Heart Failure: Bensimhon, MD  Chief Complaint: ***  History of Present Illness:   Lucas Caldwell is a 87 y.o. 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  He previously underwent RCA stenting in 08/2016 and was noted to have residual 70% left main stenosis. At that time, he was seen by CVTS and not felt to be a suitable surgical candidate, and was medically managed. Following this, he continued to note DOE and underwent repeat diagnostic cath in 08/2018 which demonstrated 70% left main stenosis with an abnormal iFR of 0.86, though the drop in iFR was due to diffuse proximal LAD disease and there was only a small gradient involving the ostial left main. He was medically managed. Follow up echo at that time showed a reduction in his EF to 35-40%. In the setting of ongoing anginal symptoms throughout 2020, he subsequently underwent PCI/DES to the proximal LAD in 03/2019. Follow up echo in 04/2019 showed an EF of 30-35% with global hypokinesis. Following PCI, he noted significant improvement in his symptoms and he was referred to the heart failure clinic. Prior event monitoring in 06/2019, did not demonstrate any significant arrhythmias. He underwent CardioMEMS and RHC in 7/021. RHC was notable for an RA of 8, PA 34/7 (17), PCWP 12, and normal cardiac output/index. Post procedure course was complicated by a right groin wound infection.  We last saw him in the [place] office in 12/2019, at which time he was doing well from a  cardiac perspective, with stable symptoms.  He was planning to see Urology for incontinence issues.  He was last seen by the advanced heart failure team in 01/2020, with losartan being titrated to 50 mg with further plans to possibly transition to [facility]. He did experience epistaxis in 02/2020 with noted therapeutic INR in that timeframe.   ***   Labs independently reviewed: 03/29/2020 - INR 2.3 02/2020 - potassium 3.9, BUN 23, SCr 1.60 12/2019 - A1c 7.4 10/2019 - HGB 11.9, PLT 223, BNP 100 04/2019 - TSH normal, magnesium 1.8 08/2018 - TC 110, TG 155, HDL 28, LDL 51, albumin 3.7, AST/ALT normal   Past Medical History:  Diagnosis Date  . Arthritis    "probably in his legs" (08/15/2016)  . Blind right eye   . BPH (benign prostatic hyperplasia)   . Breast asymmetry    Left breast is larger, present for several years.  [name] CAD (coronary artery disease)    a. Cath in the late 90's - reportedly ok;  b. 2014 s/p stenting x 2 @ [facility]; c 08/19/16 Cath/PCI with DES -> RCA, plan to treat LM 70% medically. Seen by surgery and felt to be too high risk for CABG; d. 08/2018 Cath: LM 70 (iFR 0.86), LAD 20ost, 40p, 50/35m, D1 70, LCX patent stent, OM1 30, RCA patent stent, 67m ISR, 40d, RPDA 30. PCWP 8. CO/CI 4.0/2.1; e. 03/2019 PCI to LAD (2.5x15 Resolute Onyx DES).  . Chronic combined systolic (congestive) and diastolic (congestive) heart failure (HCC)  a. Previously reduced EF-->50% by echo in 2012;  b. 06/2015 Echo: EF 50-55%  c. 07/2016 Echo: EF 45-50%; d. 12/2017 Echo: EF 55%, Gr1 DD, mild MR; e. 08/2018 Echo: EF 35-40%, mildly dil PA; f. 04/2019 Echo: EF 30-35%, glob HK, nl RV fxn; g. s/p Cardiomems.  . Chronic kidney disease (CKD), stage IV (severe) ([place])   . CML (chronic myelocytic leukemia) ([place])   . GERD (gastroesophageal reflux disease)   . GIB (gastrointestinal bleeding)    a. 12/2017 3 unit PRBC GIB in setting of coumadin-->Endo/colon multiple duodenal ulcers and a single bleeding ulcer in the  proximal ascending colon status post hemostatic clipping x2.  . Gout   . Hyperlipemia   . Hypertension   . Hypothyroidism   . Iron deficiency anemia   . Ischemic cardiomyopathy    a. Previously reduced EF-->50% by echo in 2012;  b. 06/2015 Echo: EF 50-55%, Gr2 DD, mild MR, mildly dil LA, Ao sclerosis, mild TR;  c. 07/2016 Echo: EF 45-50%; d. 12/2017 Echo: EF 55%, Gr1 DD, mild MR; e. 08/2018 Echo: EF 35-40%; f. 04/2019 Echo: EF 30-35%.  . Migraine    "in the 1960s" (08/15/2016)  . PAF (paroxysmal atrial fibrillation) (HCC)    a. ? Dx 2014-->s/p DCCV;  b. CHA2DS2VASc = 6--> Coumadin.  . Prostate cancer ([place])   . RBBB   . Type II diabetes mellitus ([place])     Past Surgical History:  Procedure Laterality Date  . CATARACT EXTRACTION W/ INTRAOCULAR LENS  IMPLANT, BILATERAL Bilateral   . COLONOSCOPY N/A 01/13/2018   Procedure: COLONOSCOPY;  Surgeon: Lin Landsman, MD;  Location: [facility] ENDOSCOPY;  Service: Gastroenterology;  Laterality: N/A;  . COLONOSCOPY WITH PROPOFOL N/A 01/01/2018   Procedure: COLONOSCOPY WITH PROPOFOL;  Surgeon: Lin Landsman, MD;  [facility] ENDOSCOPY;  Service: Gastroenterology;  Laterality: N/A;  . CORONARY ANGIOPLASTY WITH STENT PLACEMENT  09/2012   2 stents  . CORONARY STENT INTERVENTION N/A 08/19/2016   Procedure: Coronary Stent Intervention;  Surgeon: [name], MD;  Location: [place] CV LAB;  Service: Cardiovascular;  Laterality: N/A;  . CORONARY STENT INTERVENTION N/A 04/13/2019   Procedure: CORONARY STENT INTERVENTION;  Surgeon: [name], MD;  Location: [place] CV LAB;  Service: Cardiovascular;  Laterality: N/A;  . ESOPHAGEAL MANOMETRY N/A 12/16/2017   Procedure: ESOPHAGEAL MANOMETRY (EM);  Surgeon: Lin Landsman, MD;  Location: [facility] ENDOSCOPY;  Service: Gastroenterology;  Laterality: N/A;  . ESOPHAGOGASTRODUODENOSCOPY N/A 12/20/2016   Procedure: ESOPHAGOGASTRODUODENOSCOPY (EGD);  Surgeon: Lin Landsman, MD;  Location:  [facility] ENDOSCOPY;  Service: Gastroenterology;  Laterality: N/A;  . ESOPHAGOGASTRODUODENOSCOPY N/A 01/13/2018   Procedure: ESOPHAGOGASTRODUODENOSCOPY (EGD);  Surgeon: Lin Landsman, MD;  Location: [facility] ENDOSCOPY;  Service: Gastroenterology;  Laterality: N/A;  . ESOPHAGOGASTRODUODENOSCOPY (EGD) WITH PROPOFOL N/A 11/03/2017   Procedure: ESOPHAGOGASTRODUODENOSCOPY (EGD) WITH PROPOFOL;  Surgeon: Lin Landsman, MD;  Location: [facility] ENDOSCOPY;  Service: Gastroenterology;  Laterality: N/A;  . EYE SURGERY    . HERNIA REPAIR     "navel"  . LEFT HEART CATH AND CORONARY ANGIOGRAPHY N/A 08/14/2016   Procedure: Left Heart Cath and Coronary Angiography;  Surgeon: [name], MD;  Location: [place] CV LAB;  Service: Cardiovascular;  Laterality: N/A;  . PRESSURE SENSOR/CARDIOMEMS N/A 09/23/2019   Procedure: PRESSURE SENSOR/CARDIOMEMS;  Surgeon: Jolaine Artist, MD;  Location: [place] CV LAB;  Service: Cardiovascular;  Laterality: N/A;  . RIGHT HEART CATH N/A 09/23/2019   Procedure: RIGHT HEART CATH;  Surgeon: Jolaine Artist,  MD;  Location: [place] CV LAB;  Service: Cardiovascular;  Laterality: N/A;  . RIGHT/LEFT HEART CATH AND CORONARY ANGIOGRAPHY N/A 09/01/2018   Procedure: RIGHT/LEFT HEART CATH AND CORONARY ANGIOGRAPHY;  Surgeon: [name], MD;  Location: [place] CV LAB;  Service: Cardiovascular;  Laterality: N/A;  . RIGHT/LEFT HEART CATH AND CORONARY ANGIOGRAPHY N/A 04/13/2019   Procedure: RIGHT/LEFT HEART CATH AND CORONARY ANGIOGRAPHY;  Surgeon: [name], MD;  Location: [place] CV LAB;  Service: Cardiovascular;  Laterality: N/A;  . TOE AMPUTATION Right    "big toe"    Current Medications: No outpatient medications have been marked as taking for the 04/12/20 encounter (Appointment) with Rise Mu, PA-C.    Allergies:   Clopidogrel, Ciprofloxacin, Mirtazapine, and Spironolactone   Social History   Socioeconomic History  . Marital status:  Married    Spouse name: Not on file  . Number of children: Not on file  . Years of education: Not on file  . Highest education level: 7th grade  Occupational History  . Occupation: Retired    Comment: Owned his own [place].  Tobacco Use  . Smoking status: Former Smoker    Packs/day: 1.75    Years: 20.00    Pack years: 35.00    Types: Cigarettes    Start date: 1955    Quit date: 1975    Years since quitting: 47.0  . Smokeless tobacco: Former Network engineer  . Vaping Use: Never used  Substance and Sexual Activity  . Alcohol use: No    Comment: previously drank heavily - quit 1979.  . Drug use: No  . Sexual activity: Never  Other Topics Concern  . Not on file  Social History Narrative   Working on transportation truck part time    Social Determinants of Radio broadcast assistant Strain: Low Risk   . Difficulty of Paying Living Expenses: Not very hard  Food Insecurity: No Food Insecurity  . Worried About Charity fundraiser in the Last Year: Never true  . Ran Out of Food in the Last Year: Never true  Transportation Needs: No Transportation Needs  . Lack of Transportation (Medical): No  . Lack of Transportation (Non-Medical): No  Physical Activity: Inactive  . Days of Exercise per Week: 0 days  . Minutes of Exercise per Session: 0 min  Stress: No Stress Concern Present  . Feeling of Stress : Not at all  Social Connections: Moderately Integrated  . Frequency of Communication with Friends and Family: More than three times a week  . Frequency of Social Gatherings with Friends and Family: More than three times a week  . Attends Religious Services: More than 4 times per year  . Active Member of Clubs or Organizations: No  . Attends Archivist Meetings: Never  . Marital Status: Married     Family History:  The patient's family history includes Brain cancer in his father; Diabetes in his sister; Heart attack in his sister.  ROS:    ROS   EKGs/Labs/Other Studies Reviewed:    Studies reviewed were summarized above. The additional studies were reviewed today:  2D echo 07/2016: - Left ventricle: The cavity size was mildly dilated. There was  mild concentric hypertrophy. Systolic function was mildly  reduced. The estimated ejection fraction was in the range of 45%  to 50%. Possible hypokinesis of the inferior myocardium. Left  ventricular diastolic function parameters were normal.  - Mitral valve: There was mild regurgitation.  -  Left atrium: The atrium was mildly dilated.  - Pulmonary arteries: Systolic pressure could not be accurately  estimated.  __________  LHC 07/2016: Conclusions: 1. Significant multivessel coronary artery disease, including 70% ostial LMCA stenosis (with catheter dampening) and 95% proximal/mid RCA lesion. 2. Patent stents in the mid LCx and mid RCA. 3. Upper normal left ventricular filling pressure.  Recommendations: 1. Given LMCA and RCA disease in the setting of diabetes mellitus, surgical consultation for CABG should be considered. However, if comorbidities preclude surgical intervention, PCI to unprotected LMCA and mid RCA could be considered. 2. Medical therapy, including gentle hydration, given chronic kidney disease. 3. Aggressive secondary prevention. __________  LHC 08/2016: Conclusions: 4. Moderate to severe proximal/mid RCA disease with focal 95% stenosis at proximal margin of previously placed mid-RCA stent. 5. Mild in-stent restenosis of mid RCA stent. 6. Successful PCI to proximal/mid RCA with placement of a Synergy 3.0 x 28 mm drug-eluting stent with 0% residual stenosis and TIMI-3 flow.  Recommendations: 4. Restart warfarin per pharmacy. Anticipate triple therapy with ASA, ticagrelor, and warfarin x 1 month, following by warfarin and ticagrelor. 5. Aggressive secondary prevention. 6. Medical management of non-critical LMCA stenosis. If chest pain and  shortness of breath persist, high-risk PCI to the ostial LMCA will need to be considered. __________  2D echo 12/2017: - Left ventricle: The cavity size was normal. Systolic function was  normal. The estimated ejection fraction was 55%. Doppler  parameters are consistent with abnormal left ventricular  relaxation (grade 1 diastolic dysfunction).  - Mitral valve: There was mild regurgitation.   Impressions:   - Normal LVEF with mild diastolic dysfunction and mild MR. __________  ABSI'S 03/2018:  +-------+-----------+-----------+------------+------------+  ABI/TBIToday's ABIToday's TBIPrevious ABIPrevious TBI  +-------+-----------+-----------+------------+------------+  Right [place]     0.74          0.76      +-------+-----------+-----------+------------+------------+  Left  [place]     0.77          0.82      +-------+-----------+-----------+------------+------------+     Bilateral ABIs appear essentially unchanged compared to prior study on  07/2015.    Summary:  Right: Resting right ankle-brachial index indicates noncompressible right  lower extremity arteries.  The right toe-brachial index is normal.   Left: Resting left ankle-brachial index indicates noncompressible left  lower extremity arteries.  The left toe-brachial index is normal. __________  [facility] 08/2018: Conclusions: 1. Multivessel CAD, including 60% ostial LMCA, diffuse proximal and mid LAD disease up to 60%, and 40% in-stent restenosis of more distal mid RCA stent. 2. Widely patent proximal RCA and LCx stents. 3. iFR of LMCA/LAD is significant at 0.86, though iFR pullback suggests that reduced flow is due to diffuse LAD disease with only a small pressure gradient involving the ostial LMCA. 4. Normal left and right heart filling pressures. 5. Mildly reduced Fick cardiac output/index. 6. Likely mildly reduced left ventricular systolic function (EF 08-65%),  though evaluation is limited by suboptimal opacification of the ventricle.  Recommendations: 1. Optimize medical therapy; will increase isosorbide mononitrate to 30 mg daily and continue other antianginal medications. 2. Decrease torsemide to 40 mg QAM and 20 mg QPM. 3. Obtain echo as an outpatient to better assess LVEF and valves. 4. Follow-up in the office in 2 weeks with BMP at that time. __________  2D echo 08/2018: 1. Severe hypokinesis of the left ventricular, entire inferior wall.  2. The left ventricle has moderately reduced systolic function, with an  ejection fraction of 35-40%.  The cavity size was normal. There is mildly  increased left ventricular wall thickness. Left ventricular diastolic  Doppler parameters are indeterminate.  Left ventricular diffuse hypokinesis.  3. The tricuspid valve is grossly normal.  4. The aortic valve is tricuspid.  5. Mildly dilated pulmonary artery.  6. The interatrial septum was not well visualized.  __________  Elwyn Reach 01/2019:  The patient was monitored for 10 days, 16 hours. 8 days, 23 hours were adequate for analysis.  The predominant rhythm was sinus with an average rate of 83 bpm (range 59-128 bpm in sinus).  There were rare PAC's and PVC's.  Multiple (25) atrial runs were observed, lasting up to 16 beats. The maximal rate was 174 bpm.  There was no sustained arrhythmia or prolonged pause.  Patient triggered events correspond to sinus rhythm and PVC's.  Predominantly sinus rhythm with rare PVC's. Multiple brief runs of PSVT noted. ___________  [facility] 03/2019: Conclusions: 1. Multivessel coronary artery, similar to prior catheterization in 08/2018. There is diffuse disease involving the ostial through mid LAD, up to 70%. Prior iFR and 08/2018 showed sequential LAD lesions were hemodynamically significant. 2. Moderately elevated left heart filling pressure and pulmonary artery pressure. 3. Mildly elevated right heart  filling pressure. 4. Mildly reduced Fick cardiac output/index. 5. Successful PCI to mid LAD using Resolute Onyx 2.5 x 15 mm drug eluting stent with <10% residual stenosis and TIMI-3 flow.  Recommendations: 1. Dual antiplatelet therapy with aspirin and ticagrelor for 3 months, at which time we will consider restarting warfarin and dropping ticagrelor. 2. Left heart pressures are significantly higher than prior catheterization in 08/2018. Patient needs further diuresis and optimization of evidence-based heart failure therapy, including afterload reduction. __________  2D echo 04/2019: 1. Left ventricular ejection fraction, by visual estimation, is 30 to  35%. The left ventricle has moderate to severely decreased function. There  is mildly increased left ventricular hypertrophy.  2. The left ventricle demonstrates global hypokinesis, lateral wall  motion best preserved.  3. Global right ventricle has normal systolic function.The right  ventricular size is normal. No increase in right ventricular wall  thickness.  4. Left atrial size was normal.  5. TR signal is inadequate for assessing pulmonary artery systolic  pressure.  __________  Elwyn Reach 06/2019: 1. Sinus rythms with 1AVB - avg HR of 81 bpm. 2. One 5-beat run NSVT 3. Two runs of SVT - the longest lasting 5 beats with an avg rate of 104 bpm 4. Rare PACs and PVCs,  ___________  R[facility] 09/2019: Findings:  RA = 8 RV = 24/5 PA = 34/5 (17) PCW = 12 Fick cardiac output/index = 5.9/3.0 Thermo CO/CI = 3.7/1.9 PVR = 1.3 WU Ao sat = 99% PA sat = 71%, 72%  Assessment: 1. Well compensated hemodynamics 2. Successful Cardiomems placement   EKG:  EKG is ordered today.  The EKG ordered today demonstrates ***  Recent Labs: 05/04/2019: Magnesium 1.8; TSH 3.850 11/04/2019: Hemoglobin 11.9; Platelets 223 02/03/2020: B Natriuretic Peptide 94.1 02/21/2020: BUN 23; Creatinine, Ser 1.60; Potassium 3.9; Sodium 135  Recent Lipid Panel     Component Value Date/Time   CHOL 110 08/28/2018 1107   TRIG 155 (H) 08/28/2018 1107   HDL 28 (L) 08/28/2018 1107   CHOLHDL 3.9 08/28/2018 1107   VLDL 31 08/28/2018 1107   LDLCALC 51 08/28/2018 1107    PHYSICAL EXAM:    VS:  There were no vitals taken for this visit.  BMI: There is no height or weight on file  to calculate BMI.  Physical Exam  Wt Readings from Last 3 Encounters:  02/17/20 203 lb (92.1 kg)  02/08/20 204 lb (92.5 kg)  02/04/20 206 lb 6.4 oz (93.6 kg)     ASSESSMENT & PLAN:   1. ***  Disposition: F/u with Dr. Saunders Revel or an APP in ***, and the advanced heart failure team as directed.   Medication Adjustments/Labs and Tests Ordered: Current medicines are reviewed at length with the patient today.  Concerns regarding medicines are outlined above. Medication changes, Labs and Tests ordered today are summarized above and listed in the Patient Instructions accessible in Encounters.   Signed, [name], PA-C 04/06/2020 1:32 PM     [place] [address], [place] [id number] [phone]

## 2020-04-06 NOTE — Chronic Care Management (AMB) (Signed)
Chronic Care Management   CCM RN Visit Note  04/06/2020 Name: Lucas Caldwell MRN: [id number] DOB: Apr 19, 1933  Subjective: Lucas Caldwell is a 87 y.o. year old male who is a primary care patient of [name], DO. The care management team was consulted for assistance with disease management and care coordination needs.    Engaged with patient by telephone for follow up visit in response to provider referral for case management and/or care coordination services.   Consent to Services:  Currently engaged with CCM team  Patient agreed to services and verbal consent obtained.   Assessment: Review of patient past medical history, allergies, medications, health status, including review of consultants reports, laboratory and other test data, was performed as part of comprehensive evaluation and provision of chronic care management services.   SDOH (Social Determinants of Health) assessments and interventions performed:    CCM Care Plan  Allergies  Allergen Reactions   Clopidogrel Anaphylaxis and Other (See Comments)    altered mental status;  (electrolytes "out of wack" and confusion per family; THEY DO NOT REMEMBER ANY OTHER REACTION)- MSB 10/10/15   Ciprofloxacin Itching   Mirtazapine Diarrhea    Bad dreams    Spironolactone Other (See Comments)    gynecomastia    Outpatient Encounter Medications as of 04/06/2020  Medication Sig Note   acetaminophen (TYLENOL) 500 MG tablet Take 1,000 mg by mouth every 6 (six) hours as needed for moderate pain or headache.     Alcohol Swabs (B-D SINGLE USE SWABS REGULAR) PADS Use to check blood sugar up to 2 times daily    allopurinol (ZYLOPRIM) 100 MG tablet Take 1 tablet (100 mg total) by mouth daily.    aspirin 81 MG chewable tablet Chew 81 mg by mouth daily.    Blood Glucose Monitoring Suppl (TRUE METRIX METER) DEVI Use to check blood sugar up to 2 times daily    bosutinib (BOSULIF) 100 MG tablet Take 100 mg by mouth  at bedtime as needed (tired/not feeling well). Take with food.     EASY TOUCH PEN NEEDLES 31G X 8 MM MISC USE 1 NEEDLE DAILY WITH LEVEMIR    empagliflozin (JARDIANCE) 10 MG TABS tablet Take 1 tablet (10 mg total) by mouth daily before breakfast.    gabapentin (NEURONTIN) 300 MG capsule TAKE TWO CAPSULES BY MOUTH TWICE DAILY MAX DOSE = 1200MG DAILY FOR KIDNEY PROTECTION    hydrALAZINE (APRESOLINE) 25 MG tablet Take 1 tablet (25 mg total) by mouth 3 (three) times daily.    Insulin Glargine ([name] KWIKPEN) 100 UNIT/ML Inject 20 Units into the skin daily.    Insulin Pen Needle 31G X 5 MM MISC 31 g by Does not apply route as directed.    isosorbide mononitrate (IMDUR) 60 MG 24 hr tablet Take 1 tablet (60 mg total) by mouth daily.    levothyroxine (SYNTHROID) 25 MCG tablet TAKE ONE TABLET BY MOUTH EVERY MORNING BEFORE BREAKFAST    loperamide (IMODIUM A-D) 2 MG tablet Take by mouth.     losartan (COZAAR) 25 MG tablet Take 2 tablets (50 mg total) by mouth daily.    Menthol-Zinc Oxide (GOLD BOND EX) Apply 1 application topically daily as needed (muscle pain).    metoprolol succinate (TOPROL-XL) 25 MG 24 hr tablet TAKE ONE TABLET BY MOUTH ONCE DAILY    mirabegron ER (MYRBETRIQ) 25 MG TB24 tablet Take 1 tablet (25 mg total) by mouth daily. From Urology    Multiple Vitamin (MULTIVITAMIN WITH MINERALS) TABS  tablet Take 1 tablet by mouth daily.    mupirocin ointment (BACTROBAN) 2 % Apply 1 application topically 2 (two) times daily. For up to 2 weeks then stop. May repeat in future as needed, can use to help prevent infection of toe.    nitroGLYCERIN (NITROSTAT) 0.4 MG SL tablet Place 1 tablet (0.4 mg total) under the tongue every 5 (five) minutes x 3 doses as needed for chest pain.    omeprazole (PRILOSEC) 20 MG capsule Take 20 mg by mouth in the morning.     Polyethyl Glycol-Propyl Glycol (SYSTANE OP) Place 1 drop into both eyes 3 (three) times daily as needed (dry/irritated eyes.).      polyethylene glycol (MIRALAX / GLYCOLAX) 17 g packet Take 17 g by mouth daily as needed for mild constipation.     potassium chloride SA (KLOR-CON) 20 MEQ tablet Take 2 tablets (40 mEq total) by mouth daily.    ranolazine (RANEXA) 500 MG 12 hr tablet Take 1 tablet (500 mg total) by mouth 2 (two) times daily.    RELION TRUE METRIX TEST STRIPS test strip Use to check blood sugar up to 2 times daily    rosuvastatin (CRESTOR) 5 MG tablet TAKE ONE TABLET BY MOUTH ONCE DAILY    simethicone (GAS-X) 80 MG chewable tablet Chew 80 mg by mouth every 6 (six) hours as needed for flatulence.    tamsulosin (FLOMAX) 0.4 MG CAPS capsule Take 1 capsule (0.4 mg total) by mouth daily after supper.    torsemide (DEMADEX) 20 MG tablet Take 3 tablets (60 mg total) by mouth daily.    traMADol (ULTRAM) 50 MG tablet TAKE ONE TO TWO TABLETS BY MOUTH AT BEDTIME AS NEEDED    TRULICITY 1.5 RD/4.0CX SOPN Inject 1.5 mg into the skin once a week. 02/04/2020: On Saturdays   warfarin (COUMADIN) 3 MG tablet TAKE 1 TO 1 & 1/2 TABLETS BY MOUTH ONCE DAILY AS DIRECTED BY THE COUMADIN CLINIC    No facility-administered encounter medications on file as of 04/06/2020.    Patient Active Problem List   Diagnosis Date Noted   Urge incontinence of urine 12/22/2019   Trifascicular block 10/20/2019   Wound infection 10/08/2019   Bifascicular block 05/13/2019   Coronary stent patent 04/13/2019   Chronic HFrEF (heart failure with reduced ejection fraction) ([place]) 03/19/2019   PSVT (paroxysmal supraventricular tachycardia) ([place]) 03/19/2019   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 ([place]) 08/24/2018   CKD (chronic kidney disease), stage IV ([place]) 06/04/2018   Osteoarthritis of knees, bilateral 06/02/2018   Hyperlipidemia associated with type 2 diabetes mellitus ([place]) 05/06/2018   Pain of lower extremity 03/06/2018   Duodenitis 01/30/2018   Chronic heart failure with preserved ejection fraction (HFpEF)  ([place]) 01/23/2018   Hematochezia    Melena    Rectal bleeding 01/11/2018   AVM (arteriovenous malformation) of colon without hemorrhage    Schatzki's ring    Esophageal dysphagia    Diabetic retinopathy ([place]) 07/08/2017   Ischemic cardiomyopathy 06/26/2017   Cardiomyopathy ([place]) 06/26/2017   Centrilobular emphysema (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[place]) 12/29/2015  Gynecomastia, male 03/31/2014   Hypertrophy of breast 03/31/2014   Nuclear sclerosis of left eye 10/22/2013   Enthesopathy of ankle and tarsus 04/10/2013   Controlled type 2 diabetes mellitus with diabetic nephropathy ([place]) 04/09/2013   Diabetic neuropathy associated with type 2 diabetes mellitus ([place]) 04/09/2013   Cataract of left eye 11/25/2011   Corneal opacity 09/30/2011   Pseudophakia of right eye 09/30/2011   GERD (gastroesophageal reflux disease) 09/25/2011   Gout 09/25/2011   Hyperlipidemia LDL goal <70 09/25/2011   Status post cataract extraction 04/11/2011   Cataract extraction status of eye 04/11/2011   Iron deficiency anemia 04/01/2011   Benign localized hyperplasia of prostate without urinary obstruction and other lower urinary tract symptoms (LUTS) 12/10/2010   Enlarged prostate without lower urinary tract symptoms (luts) 12/10/2010   Colon cancer screening 10/11/2010   Hypothyroidism 10/11/2010   Hyperlipidemia 09/20/2003   Essential and other specified forms of tremor 01/05/2001   Essential tremor 01/05/2001   Acute on chronic HFrEF (heart failure with  reduced ejection fraction) ([place]) 06/22/1993    Conditions to be addressed/monitored:CHF, HTN and DMII  Care Plan : RNCM: Heart Failure (Adult)  Updates made by Vanita Ingles since 04/06/2020 12:00 AM    Problem: RNCM: Symptom Exacerbation (Heart Failure)   Priority: Medium    Goal: RNCM: Symptom Exacerbation Prevented or Minimized   Priority: Medium  Note:   Current Barriers:   Knowledge deficits related to basic heart failure pathophysiology and self care management  Unable to independently manage HF  Unable to self administer medications as prescribed  Lacks social connections  Does not contact provider office for questions/concerns  Lack of scale in home- has scales and uses a mat supplied by cardiology clinic- weighs daily and is monitored daily by the clinic   Financial strain  Nurse Case Manager Clinical Goal(s):   Over the next 120 days, patient will weigh self daily and record  Over the next 120 days, patient will verbalize understanding of Heart Failure Action Plan and when to call doctor  Over the next 120 days, patient will take all Heart Failure mediations as prescribed Interventions:   Collaboration with [name], DO regarding development and update of comprehensive plan of care as evidenced by provider attestation and co-signature  Inter-disciplinary care team collaboration (see longitudinal plan of care)  Basic overview and discussion of pathophysiology of Heart Failure  Provided written and verbal education on low sodium diet  Reviewed Heart Failure Action Plan in depth and provided written copy  Assessed for scales in home  Discussed importance of daily weight  Reviewed role of diuretics in prevention of fluid overload  Patient Goals/Self-Care Activities  Over the next 120 days, patient will:  - Take Heart Failure Medications as prescribed - Weigh daily and record (notify MD with 3 lb weight gain over night or 5 lb in a week)  weight today was 201 - Follow CHF Action Plan - Adhere to low sodium diet - barriers to lifestyle changes reviewed and addressed - barriers to treatment reviewed and addressed - depression screen reviewed - healthy lifestyle promoted - medication-adherence assessment completed - rescue (action) plan developed - rescue (action) plan reviewed - self-awareness of signs/symptoms of worsening disease encouraged Follow Up Plan: Face to Face appointment with care management team member scheduled for:  04-24-2020 at 0830 am   Task: RNCM: Identify and Minimize Risk of Heart Failure Exacerbation   Note:   Care Management Activities:    - barriers  to lifestyle changes reviewed and addressed - barriers to treatment reviewed and addressed - depression screen reviewed - healthy lifestyle promoted - medication-adherence assessment completed - rescue (action) plan developed - rescue (action) plan reviewed - self-awareness of signs/symptoms of worsening disease encouraged       Care Plan : RNCM: Hypertension (Adult)  Updates made by Vanita Ingles since 04/06/2020 12:00 AM    Problem: RNCM: Hypertension (Hypertension)   Priority: Medium    Goal: RNCM: Hypertension Monitored   Priority: Medium  Note:   Objective:   Last practice recorded BP readings:  BP Readings from Last 3 Encounters:  02/17/20 109/68  02/08/20 127/76  02/04/20 119/66     Most recent eGFR/CrCl: No results found for: EGFR  No components found for: CRCL Current Barriers:   Knowledge Deficits related to basic understanding of hypertension pathophysiology and self care management  Knowledge Deficits related to understanding of medications prescribed for management of hypertension  Limited Social Support  Unable to independently manage HTN  Lacks social connections  Does not contact provider office for questions/concerns Case Manager Clinical Goal(s):   Over the next 120 days, patient will verbalize  understanding of plan for hypertension management  Over the next 120 days, patient will attend all scheduled medical appointments: sees cardiologist on 04-12-2020, pcp on 04-24-2020  Over the next 120 days, patient will demonstrate improved adherence to prescribed treatment plan for hypertension as evidenced by taking all medications as prescribed, monitoring and recording blood pressure as directed, adhering to low sodium/DASH diet  Over the next 120 days, patient will demonstrate improved health management independence as evidenced by checking blood pressure as directed and notifying PCP if SBP>160 or DBP > 80, taking all medications as prescribe, and adhering to a low sodium diet as discussed.  Over the next 120 days, patient will verbalize basic understanding of hypertension disease process and self health management plan as evidenced by compliance with diet restrictions, compliance with medications, working with CCM team for optimal management of health and well being.  Interventions:   Collaboration with [name], DO regarding development and update of comprehensive plan of care as evidenced by provider attestation and co-signature  Inter-disciplinary care team collaboration (see longitudinal plan of care)  UNABLE to independently:manage HTN  Evaluation of current treatment plan related to hypertension self management and patient's adherence to plan as established by provider.  Provided education to patient re: stroke prevention, s/s of heart attack and stroke, DASH diet, complications of uncontrolled blood pressure  Reviewed medications with patient and discussed importance of compliance  Discussed plans with patient for ongoing care management follow up and provided patient with direct contact information for care management team  Advised patient, providing education and rationale, to monitor blood pressure daily and record, calling PCP for findings outside established  parameters.   Reviewed scheduled/upcoming provider appointments including:  Patient Goals/Self-Care Activities  Over the next 120 days, patient will:  - Self administers medications as prescribed Attends all scheduled provider appointments Calls provider office for new concerns, questions, or BP outside discussed parameters Checks BP and records as discussed Follows a low sodium diet/DASH diet - blood pressure trends reviewed - depression screen reviewed - home or ambulatory blood pressure monitoring encouraged Follow Up Plan: Face to Face appointment with care management team member scheduled for:  04-24-2020 at 0830 am   Task: RNCM: Identify and Monitor Blood Pressure Elevation   Note:   Care Management Activities:    - blood  pressure trends reviewed - depression screen reviewed - home or ambulatory blood pressure monitoring encouraged      Care Plan : RNCM: Diabetes Type 2 (Adult)  Updates made by Vanita Ingles since 04/06/2020 12:00 AM    Problem: RNCM: Glycemic Management (Diabetes, Type 2)   Priority: Medium    Goal: RNCM: Glycemic Management Optimized   Priority: Medium  Note:   Objective:  Lab Results  Component Value Date   HGBA1C 7.4 (A) 12/22/2019    Lab Results  Component Value Date   CREATININE 1.60 (H) 02/21/2020   CREATININE 1.72 (H) 02/03/2020   CREATININE 1.65 (H) 11/24/2019     No results found for: EGFR Current Barriers:   Knowledge Deficits related to basic Diabetes pathophysiology and self care/management  Knowledge Deficits related to medications used for management of diabetes  Difficulty obtaining or cannot afford medications  Limited Social Support  Unable to self administer medications as prescribed  Lacks social connections  Does not contact provider office for questions/concerns Case Manager Clinical Goal(s):   Collaboration with [name], DO regarding development and update of comprehensive plan of care as  evidenced by provider attestation and co-signature  Inter-disciplinary care team collaboration (see longitudinal plan of care)  Over the next 120 days, patient will demonstrate improved adherence to prescribed treatment plan for diabetes self care/management as evidenced by:   daily monitoring and recording of CBG   adherence to ADA/ carb modified diet  adherence to prescribed medication regimen Interventions:   Provided education to patient about basic DM disease process  Reviewed medications with patient and discussed importance of medication adherence  Discussed plans with patient for ongoing care management follow up and provided patient with direct contact information for care management team  Provided patient with written educational materials related to hypo and hyperglycemia and importance of correct treatment  Reviewed scheduled/upcoming provider appointments including: 04-24-2020  Advised patient, providing education and rationale, to check cbg bid  and record, calling pcp for findings outside established parameters.    Review of patient status, including review of consultants reports, relevant laboratory and other test results, and medications completed. Patient Goals/Self-Care Activities  Over the next 120 days, patient will:  - Self administers oral medications as prescribed Self administers insulin as prescribed Attends all scheduled provider appointments Checks blood sugars as prescribed and utilize hyper and hypoglycemia protocol as needed Adheres to prescribed ADA/carb modified - barriers to adherence to treatment plan identified - blood glucose monitoring encouraged - blood glucose readings reviewed - individualized medical nutrition therapy provided - resources required to improve adherence to care identified - self-awareness of signs/symptoms of hypo or hyperglycemia encouraged - use of blood glucose monitoring log promoted Follow Up Plan: Face to Face  appointment with care management team member scheduled for:  04-24-2020 at 0830 am    Task: RNCM: Alleviate Barriers to Glycemic Management   Note:   Care Management Activities:    - barriers to adherence to treatment plan identified - blood glucose monitoring encouraged - blood glucose readings reviewed - individualized medical nutrition therapy provided - resources required to improve adherence to care identified - self-awareness of signs/symptoms of hypo or hyperglycemia encouraged - use of blood glucose monitoring log promoted         Plan:Face to Face appointment with care management team member scheduled for: 04-24-2020 at 0830 am  [place], MSN, [facility]  Mobile: [phone]

## 2020-04-10 ENCOUNTER — Encounter: Payer: Self-pay | Admitting: Physician Assistant

## 2020-04-10 ENCOUNTER — Ambulatory Visit: Payer: Medicare HMO | Admitting: Physician Assistant

## 2020-04-11 ENCOUNTER — Ambulatory Visit: Payer: Medicare HMO | Admitting: Family Medicine

## 2020-04-12 ENCOUNTER — Ambulatory Visit: Payer: Medicare HMO | Admitting: Physician Assistant

## 2020-04-13 ENCOUNTER — Encounter: Payer: Self-pay | Admitting: Physician Assistant

## 2020-04-13 ENCOUNTER — Ambulatory Visit (INDEPENDENT_AMBULATORY_CARE_PROVIDER_SITE_OTHER): Payer: Medicare HMO | Admitting: Family Medicine

## 2020-04-13 ENCOUNTER — Encounter: Payer: Self-pay | Admitting: Family Medicine

## 2020-04-13 ENCOUNTER — Other Ambulatory Visit: Payer: Self-pay

## 2020-04-13 VITALS — BP 129/75 | HR 85 | Ht 64.0 in | Wt 204.0 lb

## 2020-04-13 DIAGNOSIS — R531 Weakness: Secondary | ICD-10-CM | POA: Diagnosis not present

## 2020-04-13 DIAGNOSIS — N1831 Chronic kidney disease, stage 3a: Secondary | ICD-10-CM

## 2020-04-13 DIAGNOSIS — E1142 Type 2 diabetes mellitus with diabetic polyneuropathy: Secondary | ICD-10-CM | POA: Diagnosis not present

## 2020-04-13 DIAGNOSIS — R2689 Other abnormalities of gait and mobility: Secondary | ICD-10-CM | POA: Diagnosis not present

## 2020-04-13 DIAGNOSIS — E875 Hyperkalemia: Secondary | ICD-10-CM | POA: Diagnosis not present

## 2020-04-13 DIAGNOSIS — E1121 Type 2 diabetes mellitus with diabetic nephropathy: Secondary | ICD-10-CM

## 2020-04-13 DIAGNOSIS — Z794 Long term (current) use of insulin: Secondary | ICD-10-CM

## 2020-04-13 NOTE — Patient Instructions (Addendum)
Thank you for coming to the office today.  Recommend return to [facility] Dr Elvina Mattes - for Foot Pain And tell them you need the Diabetic Shoes.  We can sign off on Diabetic shoe order in 2 months - end of March  Re-schedule apt for 2 months from today.  I will check with your Neurologist Dr Tat about balance therapy  Recommend using a 4 pronged Cane for balance Caution standing quickly can cause you to be dizzy  Please schedule a Follow-up Appointment to: Return in about 2 months (around 06/11/2020) for re-schedule upcoming 04/24/20 (out of office) for 2 month from today 06/11/20 approx, DM A1c, DM Shoe.  If you have any other questions or concerns, please feel free to call the office or send a message through [place]. You may also schedule an earlier appointment if necessary.  Additionally, you may be receiving a survey about your experience at our office within a few days to 1 week by e-mail or mail. We value your feedback.  [name], DO [place]

## 2020-04-13 NOTE — Progress Notes (Signed)
Subjective:    Patient ID: Lucas Caldwell, male    DOB: 05-Jan-1934, 87 y.o.   MRN: [id number]  Lucas Caldwell is a 87 y.o. male presenting on 04/13/2020 for balance issues , Dizziness, and Blurred Vision   HPI   Balance Problem / Dizziness Diabetes / Neuropathy  Chronic problems, he is being managed for diabetes, and A1c was controlled on last measure. [name] also followed by Dr Carles Collet [facility]) for variety of issues including essential tremors and neuropathy. He has been evaluated for balance problem in past as well. He has completed physical therapy before including cardiac rehab.  Today he asks about new DM Shoes. Chart shows Last DM Shoes ordered in 06/08/19 at that visit, he asks today but it has not been 1 year since that order. He has used Psychologist, counselling med supply  He follows with [employer]. Dr Elvina Mattes. Has toenail pain and neuropathy, has upcoming appointment.  Hyperkalemia Last result 4.7 (03/23/20) Today he asks for repeat lab  Depression screen [facility] 2/9 12/22/2019 10/25/2019 09/09/2019  Decreased Interest 0 2 0  Down, Depressed, Hopeless 0 0 1  PHQ - 2 Score 0 2 1  Altered sleeping - 2 1  Tired, decreased energy - 1 1  Change in appetite - 0 2  Feeling bad or failure about yourself  - 1 0  Trouble concentrating - 0 0  Moving slowly or fidgety/restless - 0 0  Suicidal thoughts - 0 0  PHQ-9 Score - 6 5  Difficult doing work/chores - Somewhat difficult Somewhat difficult  Some recent data might be hidden    Social History   Tobacco Use  . Smoking status: Former Smoker    Packs/day: 1.75    Years: 20.00    Pack years: 35.00    Types: Cigarettes    Start date: 1955    Quit date: 1975    Years since quitting: 47.1  . Smokeless tobacco: Former Network engineer  . Vaping Use: Never used  Substance Use Topics  . Alcohol use: No    Comment: previously drank heavily - quit 1979.  . Drug use: No    Review of Systems Per HPI unless specifically indicated  above     Objective:    BP 129/75   Pulse 85   Ht 5\' 4"  (1.626 m)   Wt 204 lb (92.5 kg)   SpO2 100%   BMI 35.02 kg/m   Wt Readings from Last 3 Encounters:  04/13/20 204 lb (92.5 kg)  02/17/20 203 lb (92.1 kg)  02/08/20 204 lb (92.5 kg)    Physical Exam Vitals and nursing note reviewed.  Constitutional:      General: [name] not in acute distress.    Appearance: [name] well-developed and well-nourished. [name] not diaphoretic.     Comments: Well-appearing, comfortable, cooperative  HENT:     Head: Normocephalic and atraumatic.     Mouth/Throat:     Mouth: Oropharynx is clear and moist.  Eyes:     General:        Right eye: No discharge.        Left eye: No discharge.     Conjunctiva/sclera: Conjunctivae normal.  Cardiovascular:     Rate and Rhythm: Normal rate.  Pulmonary:     Effort: Pulmonary effort is normal.  Musculoskeletal:        General: No edema.  Skin:    General: Skin is warm and dry.  Findings: No erythema or rash.  Neurological:     Mental Status: [name] alert and oriented to person, place, and time.  Psychiatric:        Mood and Affect: Mood and affect normal.        Behavior: Behavior normal.     Comments: Well groomed, good eye contact, normal speech and thoughts    Results for orders placed or performed in visit on 03/29/20  POCT INR  Result Value Ref Range   INR 2.3 2.0 - 3.0      Assessment & Plan:   Problem List Items Addressed This Visit    Stage 3a chronic kidney disease ([place])   Relevant Orders   BASIC METABOLIC PANEL WITH GFR   Diabetic neuropathy associated with type 2 diabetes mellitus ([place])   Relevant Orders   Ambulatory referral to Physical Therapy   Controlled type 2 diabetes mellitus with diabetic nephropathy ([place])    Other Visit Diagnoses    Balance disorder    -  Primary   Relevant Orders   Ambulatory referral to Physical Therapy   Hyperkalemia       Relevant Orders   BASIC METABOLIC PANEL WITH GFR   Generalized  weakness       Relevant Orders   Ambulatory referral to Physical Therapy      #HyperK CKD Last result 4.7, will check BMET today and follow-up result. [name] on K supplement 28mEq dailyi and on diuretic that loses K.  #Balance Disorder / Generalized Weakness / Neuropathy T2DM Chronic problems - mostly controlled Diabetes. Discussed multifactorial cause of balance issue. No focal neuro deficit today or injury impacting his balance. He has reduced eye sight and neuropathy chronic issues as well as some muscle weakness these all contribute and he has history of essential tremors as well. - [name] using a single prong standard cane today, I advised him I would recommend a wider base cane such as a 3-4 prong cane. He can benefit from walker at times as well in future.  I collaborated with patient's Neurologist Dr Carles Collet [facility]) via [employer] and reviewed this case, and ultimately we agree that he may still benefit from Physical Therapy with focus on Balance, at [facility]. I will place this order / referral now. Otherwise, limited options to resolve his symptoms.  He has some dizziness but does not seem to demonstrate explicit vertigo.   Orders Placed This Encounter  Procedures  . BASIC METABOLIC PANEL WITH GFR  . Ambulatory referral to Physical Therapy    Referral Priority:   Routine    Referral Type:   Physical Medicine    Referral Reason:   Specialty Services Required    Requested Specialty:   Physical Therapy    Number of Visits Requested:   1     No orders of the defined types were placed in this encounter.     Follow up plan: Return in about 2 months (around 06/11/2020) for re-schedule upcoming 04/24/20 (out of office) for 2 month from today 06/11/20 approx, DM A1c, DM Shoe.    [name], [facility] 04/13/2020, 10:53 AM

## 2020-04-14 ENCOUNTER — Ambulatory Visit: Payer: Medicare HMO | Admitting: Pharmacist

## 2020-04-14 DIAGNOSIS — Z794 Long term (current) use of insulin: Secondary | ICD-10-CM

## 2020-04-14 LAB — BASIC METABOLIC PANEL WITH GFR
BUN/Creatinine Ratio: 16 (calc) (ref 6–22)
BUN: 27 mg/dL — ABNORMAL HIGH (ref 7–25)
CO2: 25 mmol/L (ref 20–32)
Calcium: 9.2 mg/dL (ref 8.6–10.3)
Chloride: 102 mmol/L (ref 98–110)
Creat: 1.72 mg/dL — ABNORMAL HIGH (ref 0.70–1.11)
GFR, Est African American: 41 mL/min/{1.73_m2} — ABNORMAL LOW (ref >=60)
GFR, Est Non African American: 36 mL/min/{1.73_m2} — ABNORMAL LOW (ref >=60)
Glucose, Bld: 161 mg/dL — ABNORMAL HIGH (ref 65–99)
Potassium: 4.3 mmol/L (ref 3.5–5.3)
Sodium: 137 mmol/L (ref 135–146)

## 2020-04-14 NOTE — Patient Instructions (Signed)
Visit Information  Care Plan : PharmD - Medication Management/Assistance  Updates made by Vella Raring, RPH since 04/14/2020 12:00 AM    Problem: Disease Progression     Long-Range Goal: Disease Progression Prevented or Minimized   Start Date: 04/14/2020  Expected End Date: 07/13/2020  This Visit's Progress: On track  Priority: High  Note:   Current Barriers:  . Chronic Disease Management support, education, and care coordination needs related to type 2 diabetes, CKD, HFrEF s/p CardioMEMS, hyperlipidemia, atrial fibrillation, hypertension, coronary artery disease, CHF, hypothyroidism, and CML . Financial o Note patient has Partial Extra Help/LIS Subsidy o Patient enrolled in [employer] health practitioner and [employer]) patient assistance program for 2022 calendar year o Patient denied for patient assistance for Lorenso Quarry from [employer] for 2022 calendar year . Limited vision  Pharmacist Clinical Goal(s):  [name] Over the next 90 days, patient will verbalize ability to afford treatment regimen through collaboration with PharmD and provider.  .   Interventions: . 1:1 collaboration with [name], DO regarding development and update of comprehensive plan of care as evidenced by provider attestation and co-signature . Inter-disciplinary care team collaboration (see longitudinal plan of care) . Receive a message from Center letting me know patient reached out to her yesterday requesting a call back from me.  Medication Assistance . Today patient's wife reports patient's supply of Vania Rea is running low (6 day supply remaining) . Note patient was denied for [employer] patient assistance for Jardiance for 2022 calendar year based on copayment amount/household income . Note CPP with [facility] has advised clinic will provide patient with samples of Jardiance and while patient on [place] waitlist for heart failure fund.  o Remind  patient's wife samples available from HF Clinic in [place]; confirms she has phone number for clinic. States that she will call today  Coordination of care . From review of chart, note patient missed appointments with Cardiology on 1/26 and with Urology on 1/24 o Mrs. Schara reports aware of missed appointment with Urology, but not Cardiology. Confirms has phone numbers to call about rescheduling   Patient Goals/Self-Care Activities . Over the next 90 days, patient will:  . Check blood sugar, blood pressure and daily weight as directed by providers  . Take medications, with assistance of wife, as directed o Mrs. Velarde manages patient's medications using weekly pillbox as adherence tool . Attends scheduled medical appointments  Follow Up Plan: Telephone follow up appointment with care management team member scheduled for: 05/12/2020 10:00 AM       The patient verbalized understanding of instructions, educational materials, and care plan provided today and declined offer to receive copy of patient instructions, educational materials, and care plan.   [name], PharmD, [place] [phone]

## 2020-04-14 NOTE — Progress Notes (Signed)
Cardiology Office Note    Date:  04/18/2020   ID:  Lucas Caldwell, DOB Aug 04, 1933, MRN [id number]  PCP:  [name], DO  Cardiologist:  [name], MD  Electrophysiologist:  None  Advanced Heart Failure: Bensimhon, MD  Chief Complaint: Follow up  History of Present Illness:   Lucas Caldwell is a 87 y.o. male with history of CAD s/p prior RCA, LCx, and LAD stenting, HFrEF s/p CardioMEMS with likely mixed ICM and NICM, PAF on warfarin, CKD stage III-IV, DM2, GI bleed due to duodenal ulcers in 12/2017, HTN, HLD, iron deficiency anemia, hypothyroidism, and BPH who presents for follow up of his CAD, cardiomyopathy, and Afib.  He previously underwent RCA stenting in 08/2016 and was noted to have residual 70% left main stenosis. At that time, he was seen by CVTS and not felt to be a suitable surgical candidate, and was medically managed. Following this, he continued to note DOE and underwent repeat diagnostic cath in 08/2018 which demonstrated 70% left main stenosis with an abnormal iFR of 0.86, though the drop in iFR was due to diffuse proximal LAD disease and there was only a small gradient involving the ostial left main. He was medically managed. Follow up echo at that time showed a reduction in his EF to 35-40%. In the setting of ongoing anginal symptoms throughout 2020, he subsequently underwent PCI/DES to the proximal LAD in 03/2019. Follow up echo in 04/2019 showed an EF of 30-35% with global hypokinesis. Following PCI, he noted significant improvement in his symptoms and he was referred to the heart failure clinic. Prior event monitoring in 06/2019, did not demonstrate any significant arrhythmias. He underwent CardioMEMS and RHC in 09/2019. RHC was notable for an RA of 8, PA 34/7 (17), PCWP 12, and normal cardiac output/index. Post procedure course was complicated by a right groin wound infection.  We last saw him in the [place] office in 12/2019, at which time he was doing  well from a cardiac perspective, with stable symptoms.  He was planning to see Urology for incontinence issues.  He was last seen by the advanced heart failure team in 01/2020, with losartan being titrated to 50 mg with further plans to possibly transition to [facility]. He did experience epistaxis in 02/2020 with noted therapeutic INR in that timeframe.   CardioMEMS data received from the advanced heart failure clinic indicate his diastolic PA pressure is at goal dating back to 03/20/2020.  He comes in today accompanied by his wife.  He notes a several month history of increased positional dizziness and generalized weakness.  Indicates he was already somewhat dizzy and weak before but over the past several months this has worsened.  He did suffer a mechanical fall a couple weeks ago tripping while coming in from the back door.  He did not hit his head or suffer LOC.  He denies any hematochezia or melena.  Weight has been stable.  [name] uncertain about blood pressure readings.  His wife wonders if he is on too much medication.  He notes stable chronic dyspnea.  Otherwise, he denies any chest pain, palpitations, worsening dyspnea, lower extremity swelling, abdominal distention, orthopnea, PND, early satiety.  [name] adherent to all medications.   Labs independently reviewed: 03/29/2020 - INR 2.3 02/2020 - potassium 4.3, BUN 27, SCr 1.72 12/2019 - A1c 7.4 10/2019 - HGB 11.9, PLT 223, BNP 100 04/2019 - TSH normal, magnesium 1.8 08/2018 - TC 110, TG 155, HDL 28, LDL 51,  albumin 3.7, AST/ALT normal    Past Medical History:  Diagnosis Date  . Arthritis    "probably in his legs" (08/15/2016)  . Blind right eye   . BPH (benign prostatic hyperplasia)   . Breast asymmetry    Left breast is larger, present for several years.  [name] CAD (coronary artery disease)    a. Cath in the late 90's - reportedly ok;  b. 2014 s/p stenting x 2 @ [facility]; c 08/19/16 Cath/PCI with DES -> RCA, plan to treat LM 70% medically. Seen by  surgery and felt to be too high risk for CABG; d. 08/2018 Cath: LM 70 (iFR 0.86), LAD 20ost, 40p, 50/58m, D1 70, LCX patent stent, OM1 30, RCA patent stent, 16m ISR, 40d, RPDA 30. PCWP 8. CO/CI 4.0/2.1; e. 03/2019 PCI to LAD (2.5x15 Resolute Onyx DES).  . Chronic combined systolic (congestive) and diastolic (congestive) heart failure ([place])    a. Previously reduced EF-->50% by echo in 2012;  b. 06/2015 Echo: EF 50-55%  c. 07/2016 Echo: EF 45-50%; d. 12/2017 Echo: EF 55%, Gr1 DD, mild MR; e. 08/2018 Echo: EF 35-40%, mildly dil PA; f. 04/2019 Echo: EF 30-35%, glob HK, nl RV fxn; g. s/p Cardiomems.  . Chronic kidney disease (CKD), stage IV (severe) ([place])   . CML (chronic myelocytic leukemia) ([place])   . GERD (gastroesophageal reflux disease)   . GIB (gastrointestinal bleeding)    a. 12/2017 3 unit PRBC GIB in setting of coumadin-->Endo/colon multiple duodenal ulcers and a single bleeding ulcer in the proximal ascending colon status post hemostatic clipping x2.  . Gout   . Hyperlipemia   . Hypertension   . Hypothyroidism   . Iron deficiency anemia   . Ischemic cardiomyopathy    a. Previously reduced EF-->50% by echo in 2012;  b. 06/2015 Echo: EF 50-55%, Gr2 DD, mild MR, mildly dil LA, Ao sclerosis, mild TR;  c. 07/2016 Echo: EF 45-50%; d. 12/2017 Echo: EF 55%, Gr1 DD, mild MR; e. 08/2018 Echo: EF 35-40%; f. 04/2019 Echo: EF 30-35%.  . Migraine    "in the 1960s" (08/15/2016)  . PAF (paroxysmal atrial fibrillation) (HCC)    a. ? Dx 2014-->s/p DCCV;  b. CHA2DS2VASc = 6--> Coumadin.  . Prostate cancer ([place])   . RBBB   . Type II diabetes mellitus ([place])     Past Surgical History:  Procedure Laterality Date  . CATARACT EXTRACTION W/ INTRAOCULAR LENS  IMPLANT, BILATERAL Bilateral   . COLONOSCOPY N/A 01/13/2018   Procedure: COLONOSCOPY;  Surgeon: Lin Landsman, MD;  Location: [facility] ENDOSCOPY;  Service: Gastroenterology;  Laterality: N/A;  . COLONOSCOPY WITH PROPOFOL N/A 01/01/2018   Procedure: COLONOSCOPY  WITH PROPOFOL;  Surgeon: Lin Landsman, MD;  Location: [facility] ENDOSCOPY;  Service: Gastroenterology;  Laterality: N/A;  . CORONARY ANGIOPLASTY WITH STENT PLACEMENT  09/2012   2 stents  . CORONARY STENT INTERVENTION N/A 08/19/2016   Procedure: Coronary Stent Intervention;  Surgeon: [name], MD;  Location: [place] CV LAB;  Service: Cardiovascular;  Laterality: N/A;  . CORONARY STENT INTERVENTION N/A 04/13/2019   Procedure: CORONARY STENT INTERVENTION;  Surgeon: [name], MD;  Location: [place] CV LAB;  Service: Cardiovascular;  Laterality: N/A;  . ESOPHAGEAL MANOMETRY N/A 12/16/2017   Procedure: ESOPHAGEAL MANOMETRY (EM);  Surgeon: Lin Landsman, MD;  Location: [facility] ENDOSCOPY;  Service: Gastroenterology;  Laterality: N/A;  . ESOPHAGOGASTRODUODENOSCOPY N/A 12/20/2016   Procedure: ESOPHAGOGASTRODUODENOSCOPY (EGD);  Surgeon: Lin Landsman, MD;  Location: [facility] ENDOSCOPY;  Service: Gastroenterology;  Laterality: N/A;  . ESOPHAGOGASTRODUODENOSCOPY N/A 01/13/2018   Procedure: ESOPHAGOGASTRODUODENOSCOPY (EGD);  Surgeon: Lin Landsman, MD;  Location: [facility] ENDOSCOPY;  Service: Gastroenterology;  Laterality: N/A;  . ESOPHAGOGASTRODUODENOSCOPY (EGD) WITH PROPOFOL N/A 11/03/2017   Procedure: ESOPHAGOGASTRODUODENOSCOPY (EGD) WITH PROPOFOL;  Surgeon: Lin Landsman, MD;  Location: [facility] ENDOSCOPY;  Service: Gastroenterology;  Laterality: N/A;  . EYE SURGERY    . HERNIA REPAIR     "navel"  . LEFT HEART CATH AND CORONARY ANGIOGRAPHY N/A 08/14/2016   Procedure: Left Heart Cath and Coronary Angiography;  Surgeon: [name], MD;  Location: [place] CV LAB;  Service: Cardiovascular;  Laterality: N/A;  . PRESSURE SENSOR/CARDIOMEMS N/A 09/23/2019   Procedure: PRESSURE SENSOR/CARDIOMEMS;  Surgeon: Jolaine Artist, MD;  Location: [place] CV LAB;  Service: Cardiovascular;  Laterality: N/A;  . RIGHT HEART CATH N/A 09/23/2019   Procedure: RIGHT HEART CATH;   Surgeon: Jolaine Artist, MD;  Location: [place] CV LAB;  Service: Cardiovascular;  Laterality: N/A;  . RIGHT/LEFT HEART CATH AND CORONARY ANGIOGRAPHY N/A 09/01/2018   Procedure: RIGHT/LEFT HEART CATH AND CORONARY ANGIOGRAPHY;  Surgeon: [name], MD;  Location: [place] CV LAB;  Service: Cardiovascular;  Laterality: N/A;  . RIGHT/LEFT HEART CATH AND CORONARY ANGIOGRAPHY N/A 04/13/2019   Procedure: RIGHT/LEFT HEART CATH AND CORONARY ANGIOGRAPHY;  Surgeon: [name], MD;  Location: [facility] CV LAB;  Service: Cardiovascular;  Laterality: N/A;  . TOE AMPUTATION Right    "big toe"    Current Medications: Current Meds  Medication Sig  . acetaminophen (TYLENOL) 500 MG tablet Take 1,000 mg by mouth every 6 (six) hours as needed for moderate pain or headache.   . Alcohol Swabs (B-D SINGLE USE SWABS REGULAR) PADS Use to check blood sugar up to 2 times daily  . allopurinol (ZYLOPRIM) 100 MG tablet Take 1 tablet (100 mg total) by mouth daily.  [name] aspirin 81 MG chewable tablet Chew 81 mg by mouth daily.  . Blood Glucose Monitoring Suppl (TRUE METRIX METER) DEVI Use to check blood sugar up to 2 times daily  . bosutinib (BOSULIF) 100 MG tablet Take 100 mg by mouth at bedtime as needed (tired/not feeling well). Take with food.   [name] EASY TOUCH PEN NEEDLES 31G X 8 MM MISC USE 1 NEEDLE DAILY WITH LEVEMIR  . empagliflozin (JARDIANCE) 10 MG TABS tablet Take 1 tablet (10 mg total) by mouth daily before breakfast.  . gabapentin (NEURONTIN) 300 MG capsule TAKE TWO CAPSULES BY MOUTH TWICE DAILY MAX DOSE = 1200MG  DAILY FOR KIDNEY PROTECTION  . hydrALAZINE (APRESOLINE) 25 MG tablet Take 1 tablet (25 mg total) by mouth 3 (three) times daily.  . Insulin Glargine ([name] KWIKPEN) 100 UNIT/ML Inject 20 Units into the skin daily.  . Insulin Pen Needle 31G X 5 MM MISC 31 g by Does not apply route as directed.  . isosorbide mononitrate (IMDUR) 60 MG 24 hr tablet Take 1 tablet (60 mg total) by  mouth daily.  [name] levothyroxine (SYNTHROID) 25 MCG tablet TAKE ONE TABLET BY MOUTH EVERY MORNING BEFORE BREAKFAST  . loperamide (IMODIUM A-D) 2 MG tablet Take by mouth.   . losartan (COZAAR) 25 MG tablet Take 1 tablet (25 mg total) by mouth daily.  . Menthol-Zinc Oxide (GOLD BOND EX) Apply 1 application topically daily as needed (muscle pain).  . metoprolol succinate (TOPROL-XL) 25 MG 24 hr tablet TAKE ONE TABLET BY MOUTH ONCE DAILY  . mirabegron ER (MYRBETRIQ) 25 MG TB24 tablet Take 1 tablet (25  mg total) by mouth daily. From Urology  . Multiple Vitamin (MULTIVITAMIN WITH MINERALS) TABS tablet Take 1 tablet by mouth daily.  . mupirocin ointment (BACTROBAN) 2 % Apply 1 application topically 2 (two) times daily. For up to 2 weeks then stop. May repeat in future as needed, can use to help prevent infection of toe.  . nitroGLYCERIN (NITROSTAT) 0.4 MG SL tablet Place 1 tablet (0.4 mg total) under the tongue every 5 (five) minutes x 3 doses as needed for chest pain.  [name] omeprazole (PRILOSEC) 20 MG capsule Take 20 mg by mouth in the morning.   [name] Place 1 drop into both eyes 3 (three) times daily as needed (dry/irritated eyes.).   [name] polyethylene glycol (MIRALAX / GLYCOLAX) 17 g packet Take 17 g by mouth daily as needed for mild constipation.   . potassium chloride SA (KLOR-CON) 20 MEQ tablet Take 2 tablets (40 mEq total) by mouth daily.  . ranolazine (RANEXA) 500 MG 12 hr tablet Take 1 tablet (500 mg total) by mouth 2 (two) times daily.  [name] RELION TRUE METRIX TEST STRIPS test strip Use to check blood sugar up to 2 times daily  . rosuvastatin (CRESTOR) 5 MG tablet TAKE ONE TABLET BY MOUTH ONCE DAILY  . simethicone (MYLICON) 80 MG chewable tablet Chew 80 mg by mouth every 6 (six) hours as needed for flatulence.  . tamsulosin (FLOMAX) 0.4 MG CAPS capsule Take 1 capsule (0.4 mg total) by mouth daily after supper.  . torsemide (DEMADEX) 20 MG tablet Take 3 tablets (60 mg  total) by mouth daily.  . traMADol (ULTRAM) 50 MG tablet TAKE ONE TO TWO TABLETS BY MOUTH AT BEDTIME AS NEEDED  . TRULICITY 1.5 LO/7.5IE SOPN Inject 1.5 mg into the skin once a week.  . warfarin (COUMADIN) 3 MG tablet TAKE 1 TO 1 & 1/2 TABLETS BY MOUTH ONCE DAILY AS DIRECTED BY THE COUMADIN CLINIC  . [DISCONTINUED] losartan (COZAAR) 25 MG tablet Take 2 tablets (50 mg total) by mouth daily.    Allergies:   Clopidogrel, Ciprofloxacin, Mirtazapine, and Spironolactone   Social History   Socioeconomic History  . Marital status: Married    Spouse name: Not on file  . Number of children: Not on file  . Years of education: Not on file  . Highest education level: 7th grade  Occupational History  . Occupation: Retired    Comment: Owned his own [place].  Tobacco Use  . Smoking status: Former Smoker    Packs/day: 1.75    Years: 20.00    Pack years: 35.00    Types: Cigarettes    Start date: 1955    Quit date: 1975    Years since quitting: 47.1  . Smokeless tobacco: Former Network engineer  . Vaping Use: Never used  Substance and Sexual Activity  . Alcohol use: No    Comment: previously drank heavily - quit 1979.  . Drug use: No  . Sexual activity: Never  Other Topics Concern  . Not on file  Social History Narrative   Working on transportation truck part time    Social Determinants of Radio broadcast assistant Strain: Low Risk   . Difficulty of Paying Living Expenses: Not very hard  Food Insecurity: No Food Insecurity  . Worried About Charity fundraiser in the Last Year: Never true  . Ran Out of Food in the Last Year: Never true  Transportation Needs: No Transportation Needs  .  Lack of Transportation (Medical): No  . Lack of Transportation (Non-Medical): No  Physical Activity: Inactive  . Days of Exercise per Week: 0 days  . Minutes of Exercise per Session: 0 min  Stress: No Stress Concern Present  . Feeling of Stress : Not at all  Social Connections: Moderately  Integrated  . Frequency of Communication with Friends and Family: More than three times a week  . Frequency of Social Gatherings with Friends and Family: More than three times a week  . Attends Religious Services: More than 4 times per year  . Active Member of Clubs or Organizations: No  . Attends Archivist Meetings: Never  . Marital Status: Married     Family History:  The patient's family history includes Brain cancer in his father; Diabetes in his sister; Heart attack in his sister.  ROS:   Review of Systems  Constitutional: Positive for malaise/fatigue. Negative for chills, diaphoresis, fever and weight loss.  HENT: Negative for congestion.   Eyes: Negative for discharge and redness.  Respiratory: Positive for shortness of breath. Negative for cough, sputum production and wheezing.   Cardiovascular: Negative for chest pain, palpitations, orthopnea, claudication, leg swelling and PND.  Gastrointestinal: Negative for abdominal pain, blood in stool, heartburn, melena, nausea and vomiting.  Musculoskeletal: Positive for falls. Negative for myalgias.  Skin: Negative for rash.  Neurological: Positive for dizziness and weakness. Negative for tingling, tremors, sensory change, speech change, focal weakness and loss of consciousness.  Endo/Heme/Allergies: Does not bruise/bleed easily.  Psychiatric/Behavioral: Negative for substance abuse. The patient is not nervous/anxious.   All other systems reviewed and are negative.    EKGs/Labs/Other Studies Reviewed:    Studies reviewed were summarized above. The additional studies were reviewed today:  2D echo 07/2016: - Left ventricle: The cavity size was mildly dilated. There was  mild concentric hypertrophy. Systolic function was mildly  reduced. The estimated ejection fraction was in the range of 45%  to 50%. Possible hypokinesis of the inferior myocardium. Left  ventricular diastolic function parameters were normal.  -  Mitral valve: There was mild regurgitation.  - Left atrium: The atrium was mildly dilated.  - Pulmonary arteries: Systolic pressure could not be accurately  estimated.  __________  [facility] 07/2016: Conclusions: 1. Significant multivessel coronary artery disease, including 70% ostial LMCA stenosis (with catheter dampening) and 95% proximal/mid RCA lesion. 2. Patent stents in the mid LCx and mid RCA. 3. Upper normal left ventricular filling pressure.  Recommendations: 1. Given LMCA and RCA disease in the setting of diabetes mellitus, surgical consultation for CABG should be considered. However, if comorbidities preclude surgical intervention, PCI to unprotected LMCA and mid RCA could be considered. 2. Medical therapy, including gentle hydration, given chronic kidney disease. 3. Aggressive secondary prevention. __________  [facility] 08/2016: Conclusions: 4. Moderate to severe proximal/mid RCA disease with focal 95% stenosis at proximal margin of previously placed mid-RCA stent. 5. Mild in-stent restenosis of mid RCA stent. 6. Successful PCI to proximal/mid RCA with placement of a Synergy 3.0 x 28 mm drug-eluting stent with 0% residual stenosis and TIMI-3 flow.  Recommendations: 4. Restart warfarin per pharmacy. Anticipate triple therapy with ASA, ticagrelor, and warfarin x 1 month, following by warfarin and ticagrelor. 5. Aggressive secondary prevention. 6. Medical management of non-critical LMCA stenosis. If chest pain and shortness of breath persist, high-risk PCI to the ostial LMCA will need to be considered. __________  2D echo 12/2017: - Left ventricle: The cavity size was normal. Systolic function was  normal. The estimated ejection fraction was 55%. Doppler  parameters are consistent with abnormal left ventricular  relaxation (grade 1 diastolic dysfunction).  - Mitral valve: There was mild regurgitation.   Impressions:   - Normal LVEF with mild diastolic dysfunction and  mild MR. __________  ABSI'S 03/2018:  +-------+-----------+-----------+------------+------------+  ABI/TBIToday's ABIToday's TBIPrevious ABIPrevious TBI  +-------+-----------+-----------+------------+------------+  Right [place]     0.74          0.76      +-------+-----------+-----------+------------+------------+  Left  [place]     0.77          0.82      +-------+-----------+-----------+------------+------------+     Bilateral ABIs appear essentially unchanged compared to prior study on  07/2015.    Summary:  Right: Resting right ankle-brachial index indicates noncompressible right  lower extremity arteries.  The right toe-brachial index is normal.   Left: Resting left ankle-brachial index indicates noncompressible left  lower extremity arteries.  The left toe-brachial index is normal. __________  [facility] 08/2018: Conclusions: 1. Multivessel CAD, including 60% ostial LMCA, diffuse proximal and mid LAD disease up to 60%, and 40% in-stent restenosis of more distal mid RCA stent. 2. Widely patent proximal RCA and LCx stents. 3. iFR of LMCA/LAD is significant at 0.86, though iFR pullback suggests that reduced flow is due to diffuse LAD disease with only a small pressure gradient involving the ostial LMCA. 4. Normal left and right heart filling pressures. 5. Mildly reduced Fick cardiac output/index. 6. Likely mildly reduced left ventricular systolic function (EF 41-96%), though evaluation is limited by suboptimal opacification of the ventricle.  Recommendations: 1. Optimize medical therapy; will increase isosorbide mononitrate to 30 mg daily and continue other antianginal medications. 2. Decrease torsemide to 40 mg QAM and 20 mg QPM. 3. Obtain echo as an outpatient to better assess LVEF and valves. 4. Follow-up in the office in 2 weeks with BMP at that time. __________  2D echo 08/2018: 1. Severe hypokinesis of the left  ventricular, entire inferior wall.  2. The left ventricle has moderately reduced systolic function, with an  ejection fraction of 35-40%. The cavity size was normal. There is mildly  increased left ventricular wall thickness. Left ventricular diastolic  Doppler parameters are indeterminate.  Left ventricular diffuse hypokinesis.  3. The tricuspid valve is grossly normal.  4. The aortic valve is tricuspid.  5. Mildly dilated pulmonary artery.  6. The interatrial septum was not well visualized.  __________  Elwyn Reach 01/2019:  The patient was monitored for 10 days, 16 hours. 8 days, 23 hours were adequate for analysis.  The predominant rhythm was sinus with an average rate of 83 bpm (range 59-128 bpm in sinus).  There were rare PAC's and PVC's.  Multiple (25) atrial runs were observed, lasting up to 16 beats. The maximal rate was 174 bpm.  There was no sustained arrhythmia or prolonged pause.  Patient triggered events correspond to sinus rhythm and PVC's.  Predominantly sinus rhythm with rare PVC's. Multiple brief runs of PSVT noted. ___________  [facility] 03/2019: Conclusions: 1. Multivessel coronary artery, similar to prior catheterization in 08/2018. There is diffuse disease involving the ostial through mid LAD, up to 70%. Prior iFR and 08/2018 showed sequential LAD lesions were hemodynamically significant. 2. Moderately elevated left heart filling pressure and pulmonary artery pressure. 3. Mildly elevated right heart filling pressure. 4. Mildly reduced Fick cardiac output/index. 5. Successful PCI to mid LAD using Resolute Onyx 2.5 x 15 mm drug eluting stent with <10% residual stenosis  and TIMI-3 flow.  Recommendations: 1. Dual antiplatelet therapy with aspirin and ticagrelor for 3 months, at which time we will consider restarting warfarin and dropping ticagrelor. 2. Left heart pressures are significantly higher than prior catheterization in 08/2018. Patient needs further  diuresis and optimization of evidence-based heart failure therapy, including afterload reduction. __________  2D echo 04/2019: 1. Left ventricular ejection fraction, by visual estimation, is 30 to  35%. The left ventricle has moderate to severely decreased function. There  is mildly increased left ventricular hypertrophy.  2. The left ventricle demonstrates global hypokinesis, lateral wall  motion best preserved.  3. Global right ventricle has normal systolic function.The right  ventricular size is normal. No increase in right ventricular wall  thickness.  4. Left atrial size was normal.  5. TR signal is inadequate for assessing pulmonary artery systolic  pressure.  __________  Elwyn Reach 06/2019: 1. Sinus rythms with 1AVB - avg HR of 81 bpm. 2. One 5-beat run NSVT 3. Two runs of SVT - the longest lasting 5 beats with an avg rate of 104 bpm 4. Rare PACs and PVCs,  ___________  [facility] 09/2019: Findings:  RA = 8 RV = 24/5 PA = 34/5 (17) PCW = 12 Fick cardiac output/index = 5.9/3.0 Thermo CO/CI = 3.7/1.9 PVR = 1.3 WU Ao sat = 99% PA sat = 71%, 72%  Assessment: 1. Well compensated hemodynamics 2. Successful Cardiomems placement   EKG:  EKG is not ordered today.    Recent Labs: 05/04/2019: Magnesium 1.8; TSH 3.850 11/04/2019: Hemoglobin 11.9; Platelets 223 02/03/2020: B Natriuretic Peptide 94.1 04/13/2020: BUN 27; Creat 1.72; Potassium 4.3; Sodium 137  Recent Lipid Panel    Component Value Date/Time   CHOL 110 08/28/2018 1107   TRIG 155 (H) 08/28/2018 1107   HDL 28 (L) 08/28/2018 1107   CHOLHDL 3.9 08/28/2018 1107   VLDL 31 08/28/2018 1107   LDLCALC 51 08/28/2018 1107    PHYSICAL EXAM:    VS:  BP 122/62 (BP Location: Right Arm, Patient Position: Sitting, Cuff Size: Large)   Pulse 82   Ht 5\' 4"  (1.626 m)   Wt 203 lb (92.1 kg)   SpO2 97%   BMI 34.84 kg/m   BMI: Body mass index is 34.84 kg/m.  Physical Exam Vitals reviewed.  Constitutional:      Appearance:  [name] well-developed and well-nourished.  HENT:     Head: Normocephalic and atraumatic.  Eyes:     General:        Right eye: No discharge.        Left eye: No discharge.  Neck:     Vascular: No JVD.  Cardiovascular:     Rate and Rhythm: Normal rate and regular rhythm.     Pulses: No midsystolic click and no opening snap.          Posterior tibial pulses are 2+ on the right side and 2+ on the left side.     Heart sounds: Normal heart sounds, S1 normal and S2 normal. Heart sounds not distant. No murmur heard. No friction rub.  Pulmonary:     Effort: Pulmonary effort is normal. No respiratory distress.     Breath sounds: Normal breath sounds. No decreased breath sounds, wheezing or rales.  Chest:     Chest wall: No tenderness.  Abdominal:     General: There is no distension.     Palpations: Abdomen is soft.     Tenderness: There is no abdominal tenderness.  Musculoskeletal:  General: No edema.     Cervical back: Normal range of motion.  Skin:    General: Skin is warm and dry.     Nails: There is no clubbing or cyanosis.  Neurological:     Mental Status: [name] alert and oriented to person, place, and time.  Psychiatric:        Mood and Affect: Mood and affect normal.        Speech: Speech normal.        Behavior: Behavior normal.        Thought Content: Thought content normal.        Judgment: Judgment normal.     Wt Readings from Last 3 Encounters:  04/18/20 203 lb (92.1 kg)  04/13/20 204 lb (92.5 kg)  02/17/20 203 lb (92.1 kg)     ASSESSMENT & PLAN:   1. CAD involving the native coronary arteries without angina: [name] doing well without any symptoms concerning for angina, continue current medical therapy including aspirin, Imdur, losartan, metoprolol, ranolazine, Crestor, and as needed SL NTG.  No plans for ischemic testing at this time.  2. HFrEF likely secondary to mixed NICM and ICM: He appears euvolemic and well compensated with NYHA class III symptoms.   His wife seems to think his symptoms began following the titration of losartan.  In this setting, we will reduce this to 25 mg daily.  He will otherwise continue current dose of Jardiance, hydralazine/Imdur, Toprol-XL, and torsemide.  Recent labs demonstrated stable renal function as outlined above.  3. PAF: [name] maintaining sinus rhythm on Toprol-XL without evidence of atrial arrhythmia recurrence.  Given a CHA2DS2-VASc of 6 he remains on warfarin with most recent INR therapeutic.  Follow-up with Coumadin clinic as directed.  No symptoms concerning for significant bleeding.  Recent CBC with stable hemoglobin.  4. CKD stage III: Stable on most recent check.  5. HTN: Blood pressure is reasonably controlled.  Continue current medications as outlined above.  6. HLD: LDL of 51 from 08/2018 with normal LFT at that time.  He remains on Crestor.  Check LFT, lipid panel, and direct LDL.  7. Dizziness/generalized weakness: His wife attributes this to possibly being on too many medications.  In this setting we have agreed to reduce his losartan back down to 25 mg daily as outlined above.  Continue to ambulate with a cane.  Disposition: F/u with Dr. Saunders Revel in 2 months, and advanced heart failure team as directed.   Medication Adjustments/Labs and Tests Ordered: Current medicines are reviewed at length with the patient today.  Concerns regarding medicines are outlined above. Medication changes, Labs and Tests ordered today are summarized above and listed in the Patient Instructions accessible in Encounters.   Signed, [name], PA-C 04/18/2020 3:54 PM     [place], [place] [address] [phone]

## 2020-04-14 NOTE — Progress Notes (Signed)
This encounter was created in error - please disregard.

## 2020-04-14 NOTE — Chronic Care Management (AMB) (Signed)
Chronic Care Management Pharmacy Note  04/14/2020 Name:  Lucas Caldwell MRN:  [id number] DOB:  Mar 07, 1934  Subjective: Lucas Caldwell is an 87 y.o. year old male who is a primary patient of [name], DO.  The CCM team was consulted for assistance with disease management and care coordination needs.    Receive a message from [place] letting me know patient reached out to her yesterday requesting a call back from me.  Engaged with patient and his wife by telephone for follow up visit in response to provider referral for pharmacy case management and/or care coordination services.   Consent to Services:  The patient was given information about Chronic Care Management services, agreed to services, and gave verbal consent prior to initiation of services.  Please see initial visit note for detailed documentation.   Objective:  Lab Results  Component Value Date   CREATININE 1.72 (H) 04/13/2020   CREATININE 1.60 (H) 02/21/2020   CREATININE 1.72 (H) 02/03/2020    Lab Results  Component Value Date   HGBA1C 7.4 (A) 12/22/2019       Component Value Date/Time   CHOL 110 08/28/2018 1107   TRIG 155 (H) 08/28/2018 1107   HDL 28 (L) 08/28/2018 1107   CHOLHDL 3.9 08/28/2018 1107   VLDL 31 08/28/2018 1107   LDLCALC 51 08/28/2018 1107    BP Readings from Last 3 Encounters:  04/13/20 129/75  02/17/20 109/68  02/08/20 127/76    Assessment: Review of patient past medical history, allergies, medications, health status, including review of consultants reports, laboratory and other test data, was performed as part of comprehensive evaluation and provision of chronic care management services.   SDOH:  (Social Determinants of Health) assessments and interventions performed: none   CCM Care Plan  Allergies  Allergen Reactions  . Clopidogrel Anaphylaxis and Other (See Comments)    altered mental status;  (electrolytes "out of wack" and confusion per  family; THEY DO NOT [place])- MSB 10/10/15  . Ciprofloxacin Itching  . Mirtazapine Diarrhea    Bad dreams   . Spironolactone Other (See Comments)    gynecomastia    Medications Reviewed Today    Reviewed by [name], DO (Physician) on 04/13/20 at 1053  Med List Status: <None>  Medication Order Taking? Sig Documenting Provider Last Dose Status Informant  acetaminophen (TYLENOL) 500 MG tablet [id number] Yes Take 1,000 mg by mouth every 6 (six) hours as needed for moderate pain or headache.  [provider] Taking Active Spouse/Significant Other  Alcohol Swabs (B-D SINGLE USE SWABS REGULAR) PADS [id number] Yes Use to check blood sugar up to 2 times daily [name], DO Taking Active   allopurinol (ZYLOPRIM) 100 MG tablet [id number] Yes Take 1 tablet (100 mg total) by mouth daily. Lyda Jester M, PA-C Taking Active Spouse/Significant Other  aspirin 81 MG chewable tablet [id number] Yes Chew 81 mg by mouth daily. [provider] Taking Active Spouse/Significant Other  Blood Glucose Monitoring Suppl (TRUE METRIX METER) DEVI [id number] Yes Use to check blood sugar up to 2 times daily [name], DO Taking Active   bosutinib (BOSULIF) 100 MG tablet [id number] Yes Take 100 mg by mouth at bedtime as needed (tired/not feeling well). Take with food.  [provider] Taking Active Spouse/Significant Other           Med Note Jeanie Cooks Sep 15, 2019  8:38 AM)    EASY  TOUCH PEN NEEDLES 31G X 8 MM MISC [id number] Yes USE 1 NEEDLE DAILY WITH LEVEMIR Karamalegos, [name], DO Taking Active   empagliflozin (JARDIANCE) 10 MG TABS tablet [id number] Yes Take 1 tablet (10 mg total) by mouth daily before breakfast. Bensimhon, Shaune Pascal, MD Taking Active   gabapentin (NEURONTIN) 300 MG capsule [id number] Yes TAKE TWO CAPSULES BY MOUTH TWICE DAILY MAX DOSE = 1200MG  DAILY FOR KIDNEY PROTECTION [name], DO  Taking Active   hydrALAZINE (APRESOLINE) 25 MG tablet [id number]  Take 1 tablet (25 mg total) by mouth 3 (three) times daily. Theora Gianotti, NP  Expired 02/02/20 2359   Insulin Glargine ([name] KWIKPEN) 100 UNIT/ML [id number] Yes Inject 20 Units into the skin daily. [provider] Taking Active   Insulin Pen Needle 31G X 5 MM MISC [id number] Yes 31 g by Does not apply route as directed. [name], NP Taking Active Spouse/Significant Other  isosorbide mononitrate (IMDUR) 60 MG 24 hr tablet [id number] Yes Take 1 tablet (60 mg total) by mouth daily. End, [name], MD Taking Active   levothyroxine (SYNTHROID) 25 MCG tablet [id number] Yes TAKE ONE TABLET BY MOUTH EVERY MORNING BEFORE BREAKFAST Karamalegos, [name], DO Taking Active   loperamide (IMODIUM A-D) 2 MG tablet [id number] Yes Take by mouth.  [provider] Taking Active   losartan (COZAAR) 25 MG tablet [id number] Yes Take 2 tablets (50 mg total) by mouth daily. Bensimhon, Shaune Pascal, MD Taking Active   Menthol-Zinc Oxide (GOLD [place]) [id number] Yes Apply 1 application topically daily as needed (muscle pain). [provider] Taking Active Spouse/Significant Other  metoprolol succinate (TOPROL-XL) 25 MG 24 hr tablet [id number] Yes TAKE ONE TABLET BY MOUTH ONCE DAILY End, [name], MD Taking Active   mirabegron ER (MYRBETRIQ) 25 MG TB24 tablet [id number] Yes Take 1 tablet (25 mg total) by mouth daily. From Urology Debroah Loop, PA-C Taking Active   Multiple Vitamin (MULTIVITAMIN WITH MINERALS) TABS tablet [id number] Yes Take 1 tablet by mouth daily. [provider] Taking Active Spouse/Significant Other  mupirocin ointment (BACTROBAN) 2 % [id number] Yes Apply 1 application topically 2 (two) times daily. For up to 2 weeks then stop. May repeat in future as needed, can use to help prevent infection of toe. [name], DO Taking Active   nitroGLYCERIN (NITROSTAT) 0.4 MG  SL tablet [id number] Yes Place 1 tablet (0.4 mg total) under the tongue every 5 (five) minutes x 3 doses as needed for chest pain. Theora Gianotti, NP Taking Active   omeprazole (PRILOSEC) 20 MG capsule [id number] Yes Take 20 mg by mouth in the morning.  [provider] Taking Active Spouse/Significant Other  Polyethyl Glycol-Propyl Glycol (SYSTANE OP) [id number] Yes Place 1 drop into both eyes 3 (three) times daily as needed (dry/irritated eyes.).  [provider] Taking Active Spouse/Significant Other  polyethylene glycol (MIRALAX / GLYCOLAX) 17 g packet [id number] Yes Take 17 g by mouth daily as needed for mild constipation.  [provider] Taking Active Spouse/Significant Other  potassium chloride SA (KLOR-CON) 20 MEQ tablet [id number] Yes Take 2 tablets (40 mEq total) by mouth daily. [name], DO Taking Active   ranolazine (RANEXA) 500 MG 12 hr tablet [id number] Yes Take 1 tablet (500 mg total) by mouth 2 (two) times daily. End, [name], MD Taking Active Spouse/Significant Other  RELION TRUE METRIX TEST STRIPS test strip [id number] Yes Use to check blood sugar up to 2 times daily [name], DO Taking Active  rosuvastatin (CRESTOR) 5 MG tablet [id number] Yes TAKE ONE TABLET BY MOUTH ONCE DAILY End, [name], MD Taking Active   simethicone (MYLICON) 80 MG chewable tablet [id number] Yes Chew 80 mg by mouth every 6 (six) hours as needed for flatulence. [provider] Taking Active   tamsulosin (FLOMAX) 0.4 MG CAPS capsule [id number] Yes Take 1 capsule (0.4 mg total) by mouth daily after supper. Rise Mu, PA-C Taking Active   torsemide (DEMADEX) 20 MG tablet [id number] Yes Take 3 tablets (60 mg total) by mouth daily. Lyda Jester M, PA-C Taking Active   traMADol (ULTRAM) 50 MG tablet [id number] Yes TAKE ONE TO TWO TABLETS BY MOUTH AT BEDTIME AS NEEDED [name], DO Taking Active   TRULICITY 1.5 FB/5.1WC  SOPN [id number] Yes Inject 1.5 mg into the skin once a week. [name], DO Taking Active            Med Note [place] Cunas, Eimi Viney A   Fri Feb 04, 2020  4:09 PM) On Saturdays  warfarin (COUMADIN) 3 MG tablet [id number] Yes TAKE 1 TO 1 & 1/2 TABLETS BY MOUTH ONCE DAILY AS DIRECTED BY THE COUMADIN CLINIC End, [name], MD Taking Active           Patient Active Problem List   Diagnosis Date Noted  . Urge incontinence of urine 12/22/2019  . Trifascicular block 10/20/2019  . Wound infection 10/08/2019  . Bifascicular block 05/13/2019  . Coronary stent patent 04/13/2019  . Chronic HFrEF (heart failure with reduced ejection fraction) ([place]) 03/19/2019  . PSVT (paroxysmal supraventricular tachycardia) ([place]) 03/19/2019  . 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 CKD (chronic kidney disease), stage IV ([place]) 06/04/2018  . Osteoarthritis of knees, bilateral 06/02/2018  . Hyperlipidemia associated with type 2 diabetes mellitus ([place]) 05/06/2018  . Pain of lower extremity 03/06/2018  . Duodenitis 01/30/2018  . Chronic heart failure with preserved ejection fraction (HFpEF) ([place]) 01/23/2018  . Hematochezia   . Melena   . Rectal bleeding 01/11/2018  . AVM (arteriovenous malformation) of colon without hemorrhage   . Schatzki's ring   . Esophageal dysphagia   . Diabetic retinopathy ([place]) 07/08/2017  . Ischemic cardiomyopathy 06/26/2017  . Cardiomyopathy ([place]) 06/26/2017  . Centrilobular emphysema ([place]) 06/13/2017  . Fatigue 12/18/2016  . Stable angina ([place]) 09/20/2016  . Stage 3a chronic kidney disease ([place]) 09/20/2016  . Paroxysmal atrial fibrillation ([place]) 08/28/2016  . Chronic anticoagulation 08/28/2016  . Essential hypertension 08/20/2016  . 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 heart with stable angina pectoris ([place]) 08/19/2016  . Accelerating angina ([place]) 08/15/2016  . Exertional dyspnea 08/12/2016  . 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 Gynecomastia, male 03/31/2014  . Hypertrophy of breast 03/31/2014  . Nuclear sclerosis of left eye 10/22/2013  . Enthesopathy of ankle and tarsus 04/10/2013  . Controlled type 2 diabetes mellitus with diabetic nephropathy ([place]) 04/09/2013  . Diabetic neuropathy associated with type 2 diabetes mellitus ([place]) 04/09/2013  . Cataract of left eye 11/25/2011  . Corneal opacity 09/30/2011  . Pseudophakia of right eye 09/30/2011  . GERD (gastroesophageal reflux disease) 09/25/2011  . Gout 09/25/2011  . Hyperlipidemia LDL goal <70 09/25/2011  . Status post cataract extraction 04/11/2011  . Cataract extraction status of eye 04/11/2011  . Iron deficiency anemia 04/01/2011  . Benign localized hyperplasia of prostate without urinary obstruction and other lower urinary tract symptoms (LUTS) 12/10/2010  . Enlarged prostate without lower urinary tract symptoms (luts)  12/10/2010  . Colon cancer screening 10/11/2010  . Hypothyroidism 10/11/2010  . Hyperlipidemia 09/20/2003  . Essential and other specified forms of tremor 01/05/2001  . Essential tremor 01/05/2001  . Acute on chronic HFrEF (heart failure with reduced ejection fraction) ([place]) 06/22/1993    Conditions to be addressed/monitored: HTN, HLD and DMII  Care Plan : PharmD - Medication Management/Assistance  Updates made by Vella Raring, [place] since 04/14/2020 12:00 AM    Problem: Disease Progression     Long-Range Goal: Disease Progression Prevented or Minimized   Start Date: 04/14/2020  Expected End Date: 07/13/2020  This Visit's Progress: On track  Priority: High  Note:   Current Barriers:  . Chronic Disease Management support, education, and care coordination needs related to type 2 diabetes, CKD, HFrEF s/p CardioMEMS, hyperlipidemia, atrial fibrillation, hypertension, coronary artery disease, CHF, hypothyroidism, and CML . Financial o Note patient has Partial Extra Help/LIS Subsidy o Patient enrolled in [place]  Environmental health practitioner and [employer]) patient assistance program for 2022 calendar year o Patient denied for patient assistance for Lorenso [place]ar[place] from [employer] for 2022 calendar year . Limited vision  Pharmacist Clinical Goal(s):  [name] Over the next 90 days, patient will verbalize ability to afford treatment regimen through collaboration with PharmD and provider.  .   Interventions: . 1:1 collaboration with [name], DO regarding development and update of comprehensive plan of care as evidenced by provider attestation and co-signature . Inter-disciplinary care team collaboration (see longitudinal plan of care) . Receive a message from [place] letting me know patient reached out to her yesterday requesting a call back from me.  Medication Assistance . Today patient's wife reports patient's supply of Vania Rea is running low (6 day supply remaining) . Note patient was denied for [employer] patient assistance for Jardiance for 2022 calendar year based on copayment amount/household income . Note CPP with Heart Failure Clinic has advised clinic will provide patient with samples of Jardiance and while patient on [place] waitlist for heart failure fund.  o Remind patient's wife samples available from HF Clinic in [place]; confirms she has phone number for clinic. States that she will call today  Coordination of care . From review of chart, note patient missed appointments with Cardiology on 1/26 and with Urology on 1/24 o Mrs. Vallery reports aware of missed appointment with Urology, but not Cardiology. Confirms has phone numbers to call about rescheduling   Patient Goals/Self-Care Activities . Over the next 90 days, patient will:  . Check blood sugar, blood pressure and daily weight as directed by providers  . Take medications, with assistance of wife, as directed o Mrs. Kandel manages patient's medications using weekly pillbox as adherence  tool . Attends scheduled medical appointments  Follow Up Plan: Telephone follow up appointment with care management team member scheduled for: 05/12/2020 10:00 AM     Follow Up:  Patient agrees to Care Plan and Follow-up.  [name], PharmD, [place] [phone]

## 2020-04-17 ENCOUNTER — Telehealth: Payer: Self-pay | Admitting: Physician Assistant

## 2020-04-17 NOTE — Telephone Encounter (Signed)
[name] is requesting Jardiance samples. Please call to advise if there are samples available

## 2020-04-17 NOTE — Telephone Encounter (Signed)
I spoke with pt today and let pt know that we don't have any samples available and will need to contact drug rep. Pt has upcoming appointment tomorrow with [name].

## 2020-04-18 ENCOUNTER — Other Ambulatory Visit: Payer: Self-pay

## 2020-04-18 ENCOUNTER — Encounter (HOSPITAL_COMMUNITY): Payer: Self-pay | Admitting: *Deleted

## 2020-04-18 ENCOUNTER — Ambulatory Visit (INDEPENDENT_AMBULATORY_CARE_PROVIDER_SITE_OTHER): Payer: Medicare HMO | Admitting: Physician Assistant

## 2020-04-18 ENCOUNTER — Encounter: Payer: Self-pay | Admitting: Physician Assistant

## 2020-04-18 VITALS — BP 122/62 | HR 82 | Ht 64.0 in | Wt 203.0 lb

## 2020-04-18 DIAGNOSIS — I480 Paroxysmal atrial fibrillation: Secondary | ICD-10-CM | POA: Diagnosis not present

## 2020-04-18 DIAGNOSIS — E785 Hyperlipidemia, unspecified: Secondary | ICD-10-CM

## 2020-04-18 DIAGNOSIS — R5381 Other malaise: Secondary | ICD-10-CM | POA: Diagnosis not present

## 2020-04-18 DIAGNOSIS — I2510 Atherosclerotic heart disease of native coronary artery without angina pectoris: Secondary | ICD-10-CM

## 2020-04-18 DIAGNOSIS — I10 Essential (primary) hypertension: Secondary | ICD-10-CM

## 2020-04-18 DIAGNOSIS — R42 Dizziness and giddiness: Secondary | ICD-10-CM | POA: Diagnosis not present

## 2020-04-18 DIAGNOSIS — I5022 Chronic systolic (congestive) heart failure: Secondary | ICD-10-CM | POA: Diagnosis not present

## 2020-04-18 DIAGNOSIS — N183 Chronic kidney disease, stage 3 unspecified: Secondary | ICD-10-CM

## 2020-04-18 MED ORDER — LOSARTAN POTASSIUM 25 MG PO TABS: 25.0000 mg | ORAL_TABLET | Freq: Every day | ORAL | 3 refills | Status: DC

## 2020-04-18 NOTE — Telephone Encounter (Signed)
Spoke with patients wife per release form and she inquired about samples for his jardiance. Confirmed his appointment later today and let her know that we can review at that time. Discussed that per previous call there are not samples available but we can discuss at his visit. She verbalized understanding with no further questions at this time.   We do not have any Jardiance samples to provide at this time.

## 2020-04-18 NOTE — Patient Instructions (Signed)
Medication Instructions:  Your physician has recommended you make the following change in your medication:  1. DECREASE Losartan to 25 mg once daily  *If you need a refill on your cardiac medications before your next appointment, please call your pharmacy*   Lab Work: None  If you have labs (blood work) drawn today and your tests are completely normal, you will receive your results only by: [name] MyChart Message (if you have MyChart) OR . A paper copy in the mail If you have any lab test that is abnormal or we need to change your treatment, we will call you to review the results.   Testing/Procedures: None   Follow-Up: At [facility], you and your health needs are our priority.  As part of our continuing mission to provide you with exceptional heart care, we have created designated Provider Care Teams.  These Care Teams include your primary Cardiologist (physician) and Advanced Practice Providers (APPs -  Physician Assistants and Nurse Practitioners) who all work together to provide you with the care you need, when you need it.  We recommend signing up for the patient portal called "MyChart".  Sign up information is provided on this After Visit Summary.  MyChart is used to connect with patients for Virtual Visits (Telemedicine).  Patients are able to view lab/test results, encounter notes, upcoming appointments, etc.  Non-urgent messages can be sent to your provider as well.   To learn more about what you can do with MyChart, go to [email or web address].    Your next appointment:   2 month(s)  The format for your next appointment:   In Person  Provider:   [name], MD

## 2020-04-18 NOTE — Telephone Encounter (Signed)
Please see note below. 

## 2020-04-18 NOTE — Progress Notes (Signed)
Heart Failure clinic in [place] requested 4 weeks of samples of Jardiance for this patient. Samples were delivered to our office and will be given to him at appointment today.  Medication Samples have been provided to the patient.  Drug name: Jardiance Strength: 10 mg         Qty: 16   LOT: [id number] Exp.Date: 12/23

## 2020-04-19 ENCOUNTER — Ambulatory Visit: Payer: Medicare HMO | Attending: Neurology

## 2020-04-19 DIAGNOSIS — G4733 Obstructive sleep apnea (adult) (pediatric): Secondary | ICD-10-CM | POA: Insufficient documentation

## 2020-04-19 DIAGNOSIS — G4761 Periodic limb movement disorder: Secondary | ICD-10-CM | POA: Diagnosis not present

## 2020-04-19 DIAGNOSIS — J9690 Respiratory failure, unspecified, unspecified whether with hypoxia or hypercapnia: Secondary | ICD-10-CM | POA: Diagnosis not present

## 2020-04-20 ENCOUNTER — Other Ambulatory Visit: Payer: Self-pay

## 2020-04-21 DIAGNOSIS — B351 Tinea unguium: Secondary | ICD-10-CM | POA: Diagnosis not present

## 2020-04-21 DIAGNOSIS — E1142 Type 2 diabetes mellitus with diabetic polyneuropathy: Secondary | ICD-10-CM | POA: Diagnosis not present

## 2020-04-21 DIAGNOSIS — L851 Acquired keratosis [keratoderma] palmaris et plantaris: Secondary | ICD-10-CM | POA: Diagnosis not present

## 2020-04-24 ENCOUNTER — Ambulatory Visit: Payer: Medicare HMO | Admitting: Family Medicine

## 2020-04-24 ENCOUNTER — Other Ambulatory Visit: Payer: Self-pay | Admitting: Internal Medicine

## 2020-04-24 ENCOUNTER — Ambulatory Visit: Payer: Medicare HMO

## 2020-04-24 NOTE — Telephone Encounter (Signed)
Refill Request.  

## 2020-04-26 ENCOUNTER — Other Ambulatory Visit: Payer: Self-pay | Admitting: Internal Medicine

## 2020-04-26 ENCOUNTER — Encounter (HOSPITAL_COMMUNITY): Payer: Self-pay

## 2020-04-26 NOTE — Progress Notes (Signed)
Records request from [facility] received. Records faxed to [phone]. Request form sent to be scanned into chart

## 2020-04-28 ENCOUNTER — Ambulatory Visit: Payer: Self-pay | Admitting: Pharmacist

## 2020-04-28 ENCOUNTER — Telehealth: Payer: Self-pay

## 2020-04-28 DIAGNOSIS — E1121 Type 2 diabetes mellitus with diabetic nephropathy: Secondary | ICD-10-CM

## 2020-04-28 NOTE — Telephone Encounter (Signed)
   Telephone encounter was:  Successful.  04/28/2020 Name: Lucas Caldwell MRN: [id number] DOB: [birth year]  Lucas Caldwell is a 87 y.o. year old male who is a primary care patient of [name], DO . The community resource team was consulted for assistance with Hand rails for the home  Care guide performed the following interventions: Follow up call placed to community resources to determine status of patients referral Follow up call placed to the patient to discuss status of referral Had Hollice Gong at [employer] call patient's wife to update on status of request. Patient received call from [place] while I was on the phone. She is aware that she can call Sharyn Lull directly with any questions or concerns..  Follow Up Plan:  No further follow up planned at this time. The patient has been provided with needed resources.  Milicent Adams, AAS Paralegal, [place] . Embedded Care Coordination [facility]  Care Management  [address], [place] [address] ??[email or web address]  ?? [phone]   [email or web address]

## 2020-04-28 NOTE — Patient Instructions (Signed)
Visit Information  PATIENT GOALS: Care Plan : PharmD - Medication Management/Assistance  Updates made by Vella Raring, RPH since 04/28/2020 12:00 AM    Problem: Disease Progression     Long-Range Goal: Disease Progression Prevented or Minimized   Start Date: 04/14/2020  Expected End Date: 07/13/2020  Recent Progress: On track  Priority: High  Note:   Current Barriers:  . Chronic Disease Management support, education, and care coordination needs related to type 2 diabetes, CKD, HFrEF s/p CardioMEMS, hyperlipidemia, atrial fibrillation, hypertension, coronary artery disease, CHF, hypothyroidism, and CML . Financial o Note patient has Partial Extra Help/LIS Subsidy o Patient enrolled in [place] Environmental health practitioner and [employer]) patient assistance program for 2022 calendar year o Patient denied for patient assistance for Lucas Caldwell from [employer] for 2022 calendar year . Limited vision  Pharmacist Clinical Goal(s):  Lucas Caldwell Over the next 90 days, patient will verbalize ability to afford treatment regimen through collaboration with PharmD and provider.  .   Interventions: . 1:1 collaboration with Lucas Hauser, DO regarding development and update of comprehensive plan of care as evidenced by provider attestation and co-signature . Inter-disciplinary care team collaboration (see longitudinal plan of care) . Receive call from Lucas Caldwell. She received a call from patient's wife requesting help with follow up contact information for person who was helping with getting handrails installed in their home.  o From review of chart note, note Care Guide previously reached out to patient and placed referral to Vocational Rehab. . Outreach to [place] who advises that she will get in touch with Lucas Caldwell at [facility] to get an update on the status of this referral and then reach out to patient as well. . Outreach to patient's wife today to provide this update  from and contact info for Care Guide. o Lucas Caldwell also lets me know that she now has a supply of Jardiance as picked up from [facility] (received from [facility] o Will follow up with patient/wife regarding diabetes management as previously scheduled on 2/25.   Patient Goals/Self-Care Activities . Over the next 90 days, patient will:  . Check blood sugar, blood pressure and daily weight as directed by providers  . Take medications, with assistance of wife, as directed o Lucas Caldwell manages patient's medications using weekly pillbox as adherence tool . Attends scheduled medical appointments  Follow Up Plan: Telephone follow up appointment with care management team member scheduled for: 05/12/2020 10:00 AM      The patient verbalized understanding of instructions, educational materials, and care plan provided today and declined offer to receive copy of patient instructions, educational materials, and care plan.   Lucas Caldwell, PharmD, [place] [phone]

## 2020-04-28 NOTE — Chronic Care Management (AMB) (Signed)
Chronic Care Management Pharmacy Note  04/28/2020 Name:  CARA AGUINO MRN:  [id number] DOB:  September 10, 1933  Subjective: MELITON SAMAD is an 87 y.o. year old male who is a primary patient of [name], DO.  The CCM team was consulted for assistance with disease management and care coordination needs.    Receive coordination of care call from Palmyra Simcox.  Engaged with patient by telephone for follow up visit in response to provider referral for pharmacy case management and/or care coordination services.   Consent to Services:  The patient was given information about Chronic Care Management services, agreed to services, and gave verbal consent prior to initiation of services.  Please see initial visit note for detailed documentation.   Assessment: Review of patient past medical history, allergies, medications, health status, including review of consultants reports, laboratory and other test data, was performed as part of comprehensive evaluation and provision of chronic care management services.   SDOH:  (Social Determinants of Health) assessments and interventions performed: none   CCM Care Plan  Allergies  Allergen Reactions  . Clopidogrel Anaphylaxis and Other (See Comments)    altered mental status;  (electrolytes "out of wack" and confusion per family; THEY DO NOT [place])- MSB 10/10/15  . Ciprofloxacin Itching  . Mirtazapine Diarrhea    Bad dreams   . Spironolactone Other (See Comments)    gynecomastia    Medications    Reviewed by Ronaldo Miyamoto, CMA (Certified Medical Assistant) on 04/18/20 at [place] List Status: <None>  Medication Order Taking? Sig Documenting Provider Last Dose Status Informant  acetaminophen (TYLENOL) 500 MG tablet [id number] Yes Take 1,000 mg by mouth every 6 (six) hours as needed for moderate pain or headache.  [provider] Taking Active Spouse/Significant Other  Alcohol Swabs (B-D  SINGLE USE SWABS REGULAR) PADS [id number] Yes Use to check blood sugar up to 2 times daily [name], DO Taking Active   allopurinol (ZYLOPRIM) 100 MG tablet [id number] Yes Take 1 tablet (100 mg total) by mouth daily. Lyda Jester M, PA-C Taking Active Spouse/Significant Other  aspirin 81 MG chewable tablet [id number] Yes Chew 81 mg by mouth daily. [provider] Taking Active Spouse/Significant Other  Blood Glucose Monitoring Suppl (TRUE METRIX METER) DEVI [id number] Yes Use to check blood sugar up to 2 times daily [name], DO Taking Active   bosutinib (BOSULIF) 100 MG tablet [id number] Yes Take 100 mg by mouth at bedtime as needed (tired/not feeling well). Take with food.  [provider] Taking Active Spouse/Significant Other           Med Note Jeanie Cooks Sep 15, 2019  8:38 AM)    EASY TOUCH PEN NEEDLES 31G X 8 MM MISC [id number] Yes USE 1 NEEDLE DAILY WITH LEVEMIR Karamalegos, [name], DO Taking Active   empagliflozin (JARDIANCE) 10 MG TABS tablet [id number] Yes Take 1 tablet (10 mg total) by mouth daily before breakfast. Bensimhon, Shaune Pascal, MD Taking Active   gabapentin (NEURONTIN) 300 MG capsule [id number] Yes TAKE TWO CAPSULES BY MOUTH TWICE DAILY MAX DOSE = 1200MG  DAILY FOR KIDNEY PROTECTION [name], DO Taking Active   hydrALAZINE (APRESOLINE) 25 MG tablet [id number] Yes Take 1 tablet (25 mg total) by mouth 3 (three) times daily. Theora Gianotti, NP Taking Expired 02/02/20 2359   Insulin Glargine ([name] KWIKPEN) 100 UNIT/ML [id number] Yes Inject 20 Units into the skin daily.  [provider] Taking Active   Insulin Pen Needle 31G X 5 MM MISC [id number] Yes 31 g by Does not apply route as directed. [name], NP Taking Active Spouse/Significant Other  isosorbide mononitrate (IMDUR) 60 MG 24 hr tablet [id number] Yes Take 1 tablet (60 mg total) by mouth daily. End, [name], MD  Taking Active   levothyroxine (SYNTHROID) 25 MCG tablet [id number] Yes TAKE ONE TABLET BY MOUTH EVERY MORNING BEFORE BREAKFAST Karamalegos, [name], DO Taking Active   loperamide (IMODIUM A-D) 2 MG tablet [id number] Yes Take by mouth.  [provider] Taking Active   losartan (COZAAR) 25 MG tablet [id number] Yes Take 2 tablets (50 mg total) by mouth daily. Bensimhon, Shaune Pascal, MD Taking Active   Menthol-Zinc Oxide (GOLD [place]) [id number] Yes Apply 1 application topically daily as needed (muscle pain). [provider] Taking Active Spouse/Significant Other  metoprolol succinate (TOPROL-XL) 25 MG 24 hr tablet [id number] Yes TAKE ONE TABLET BY MOUTH ONCE DAILY End, [name], MD Taking Active   mirabegron ER (MYRBETRIQ) 25 MG TB24 tablet [id number] Yes Take 1 tablet (25 mg total) by mouth daily. From Urology Debroah Loop, PA-C Taking Active   Multiple Vitamin (MULTIVITAMIN WITH MINERALS) TABS tablet [id number] Yes Take 1 tablet by mouth daily. [provider] Taking Active Spouse/Significant Other  mupirocin ointment (BACTROBAN) 2 % [id number] Yes Apply 1 application topically 2 (two) times daily. For up to 2 weeks then stop. May repeat in future as needed, can use to help prevent infection of toe. [name], DO Taking Active   nitroGLYCERIN (NITROSTAT) 0.4 MG SL tablet [id number] Yes Place 1 tablet (0.4 mg total) under the tongue every 5 (five) minutes x 3 doses as needed for chest pain. Theora Gianotti, NP Taking Active   omeprazole (PRILOSEC) 20 MG capsule [id number] Yes Take 20 mg by mouth in the morning.  [provider] Taking Active Spouse/Significant Other  Polyethyl Glycol-Propyl Glycol (SYSTANE OP) [id number] Yes Place 1 drop into both eyes 3 (three) times daily as needed (dry/irritated eyes.).  [provider] Taking Active Spouse/Significant Other  polyethylene glycol (MIRALAX / GLYCOLAX) 17 g packet [id number] Yes Take  17 g by mouth daily as needed for mild constipation.  [provider] Taking Active Spouse/Significant Other  potassium chloride SA (KLOR-CON) 20 MEQ tablet [id number] Yes Take 2 tablets (40 mEq total) by mouth daily. [name], DO Taking Active   ranolazine (RANEXA) 500 MG 12 hr tablet [id number] Yes Take 1 tablet (500 mg total) by mouth 2 (two) times daily. End, [name], MD Taking Active Spouse/Significant Other  RELION TRUE METRIX TEST STRIPS test strip [id number] Yes Use to check blood sugar up to 2 times daily [name], DO Taking Active   rosuvastatin (CRESTOR) 5 MG tablet [id number] Yes TAKE ONE TABLET BY MOUTH ONCE DAILY End, [name], MD Taking Active   simethicone (MYLICON) 80 MG chewable tablet [id number] Yes Chew 80 mg by mouth every 6 (six) hours as needed for flatulence. [provider] Taking Active   tamsulosin (FLOMAX) 0.4 MG CAPS capsule [id number] Yes Take 1 capsule (0.4 mg total) by mouth daily after supper. Rise Mu, PA-C Taking Active   torsemide (DEMADEX) 20 MG tablet [id number] Yes Take 3 tablets (60 mg total) by mouth daily. Lyda Jester M, PA-C Taking Active   traMADol (ULTRAM) 50 MG tablet [id number] Yes TAKE ONE TO TWO TABLETS BY MOUTH AT BEDTIME AS NEEDED [name], DO Taking Active  TRULICITY 1.5 EX/5.2WU SOPN [id number] Yes Inject 1.5 mg into the skin once a week. [name], DO Taking Active            Med Note Winfield Cunas, Vennie Salsbury A   Fri Feb 04, 2020  4:09 PM) On Saturdays  warfarin (COUMADIN) 3 MG tablet [id number] Yes TAKE 1 TO 1 & 1/2 TABLETS BY MOUTH ONCE DAILY AS DIRECTED BY THE COUMADIN CLINIC End, [name], MD Taking Active           Patient Active Problem List   Diagnosis Date Noted  . Urge incontinence of urine 12/22/2019  . Trifascicular block 10/20/2019  . Wound infection 10/08/2019  . Bifascicular block 05/13/2019  . Coronary stent patent 04/13/2019  . Chronic  HFrEF (heart failure with reduced ejection fraction) ([place]) 03/19/2019  . PSVT (paroxysmal supraventricular tachycardia) ([place]) 03/19/2019  . 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 CKD (chronic kidney disease), stage IV ([place]) 06/04/2018  . Osteoarthritis of knees, bilateral 06/02/2018  . Hyperlipidemia associated with type 2 diabetes mellitus ([place]) 05/06/2018  . Pain of lower extremity 03/06/2018  . Duodenitis 01/30/2018  . Chronic heart failure with preserved ejection fraction (HFpEF) ([place]) 01/23/2018  . Hematochezia   . Melena   . Rectal bleeding 01/11/2018  . AVM (arteriovenous malformation) of colon without hemorrhage   . Schatzki's ring   . Esophageal dysphagia   . Diabetic retinopathy ([place]) 07/08/2017  . Ischemic cardiomyopathy 06/26/2017  . Cardiomyopathy ([place]) 06/26/2017  . Centrilobular emphysema ([place]) 06/13/2017  . Fatigue 12/18/2016  . Stable angina ([place]) 09/20/2016  . Stage 3a chronic kidney disease ([place]) 09/20/2016  . Paroxysmal atrial fibrillation ([place]) 08/28/2016  . Chronic anticoagulation 08/28/2016  . Essential hypertension 08/20/2016  . 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 heart with stable angina pectoris ([place]) 08/19/2016  . Accelerating angina ([place]) 08/15/2016  . Exertional dyspnea 08/12/2016  . 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 Gynecomastia, male 03/31/2014  . Hypertrophy of breast 03/31/2014  . Nuclear sclerosis of left eye 10/22/2013  . Enthesopathy of ankle and tarsus 04/10/2013  . Controlled type 2 diabetes mellitus with diabetic nephropathy ([place]) 04/09/2013  . Diabetic neuropathy associated with type 2 diabetes mellitus ([place]) 04/09/2013  . Cataract of left eye 11/25/2011  . Corneal opacity 09/30/2011  . Pseudophakia of right eye 09/30/2011  . GERD (gastroesophageal reflux disease) 09/25/2011  . Gout 09/25/2011  . Hyperlipidemia LDL goal <70 09/25/2011  . Status post cataract extraction 04/11/2011  .  Cataract extraction status of eye 04/11/2011  . Iron deficiency anemia 04/01/2011  . Benign localized hyperplasia of prostate without urinary obstruction and other lower urinary tract symptoms (LUTS) 12/10/2010  . Enlarged prostate without lower urinary tract symptoms (luts) 12/10/2010  . Colon cancer screening 10/11/2010  . Hypothyroidism 10/11/2010  . Hyperlipidemia 09/20/2003  . Essential and other specified forms of tremor 01/05/2001  . Essential tremor 01/05/2001  . Acute on chronic HFrEF (heart failure with reduced ejection fraction) ([place]) 06/22/1993    Conditions to be addressed/monitored: HTN, HLD and DMII  Care Plan : PharmD - Medication Management/Assistance  Updates made by Vella Raring, [place] since 04/28/2020 12:00 AM    Problem: Disease Progression     Long-Range Goal: Disease Progression Prevented or Minimized   Start Date: 04/14/2020  Expected End Date: 07/13/2020  Recent Progress: On track  Priority: High  Note:   Current Barriers:  . Chronic Disease Management support, education, and care coordination  needs related to type 2 diabetes, CKD, HFrEF s/p CardioMEMS, hyperlipidemia, atrial fibrillation, hypertension, coronary artery disease, CHF, hypothyroidism, and CML . Financial o Note patient has Partial Extra Help/LIS Subsidy o Patient enrolled in [place] [employer] health practitioner and [employer]) patient assistance program for 2022 calendar year o Patient denied for patient assistance for Lorenso Quarry from [employer] for 2022 calendar year . Limited vision  Pharmacist Clinical Goal(s):  [name] Over the next 90 days, patient will verbalize ability to afford treatment regimen through collaboration with PharmD and provider.  .   Interventions: . 1:1 collaboration with [name], DO regarding development and update of comprehensive plan of care as evidenced by provider attestation and co-signature . Inter-disciplinary care team collaboration (see longitudinal plan  of care) . Receive call from Ophir Simcox. She received a call from patient's wife requesting help with follow up contact information for person who was helping with getting handrails installed in their home.  o From review of chart note, note Care Guide previously reached out to patient and placed referral to [facility]. . Outreach to [place] who advises that she will get in touch with Sharyn Lull at [facility] to get an update on the status of this referral and then reach out to patient as well. . Outreach to patient's wife today to provide this update from and contact info for Care Guide. o Mrs. Auriemma also lets me know that she now has a supply of Jardiance as picked up from [facility] ([facility]) o Will follow up with patient/wife regarding diabetes management as previously scheduled on 2/25.   Patient Goals/Self-Care Activities . Over the next 90 days, patient will:  . Check blood sugar, blood pressure and daily weight as directed by providers  . Take medications, with assistance of wife, as directed o Mrs. Moncada manages patient's medications using weekly pillbox as adherence tool . Attends scheduled medical appointments  Follow Up Plan: Telephone follow up appointment with care management team member scheduled for: 05/12/2020 10:00 AM      Follow Up:  Patient agrees to Care Plan and Follow-up.  [name], PharmD, [place] [phone]

## 2020-05-01 ENCOUNTER — Telehealth: Payer: Medicare HMO | Admitting: General Practice

## 2020-05-01 ENCOUNTER — Ambulatory Visit (INDEPENDENT_AMBULATORY_CARE_PROVIDER_SITE_OTHER): Payer: Medicare HMO | Admitting: General Practice

## 2020-05-01 DIAGNOSIS — Z794 Long term (current) use of insulin: Secondary | ICD-10-CM

## 2020-05-01 DIAGNOSIS — I10 Essential (primary) hypertension: Secondary | ICD-10-CM

## 2020-05-01 DIAGNOSIS — I5032 Chronic diastolic (congestive) heart failure: Secondary | ICD-10-CM

## 2020-05-01 NOTE — Chronic Care Management (AMB) (Signed)
Chronic Care Management   CCM RN Visit Note  05/01/2020 Name: VINCIENT VANAMAN MRN: [id number] DOB: 1934-01-07  Subjective: MELQUISEDEC JOURNEY is a 87 y.o. year old male who is a primary care patient of [name], DO. The care management team was consulted for assistance with disease management and care coordination needs.    Engaged with patient by telephone for follow up visit in response to provider referral for case management and/or care coordination services.   Consent to Services:  The patient was given information about Chronic Care Management services, agreed to services, and gave verbal consent prior to initiation of services.  Please see initial visit note for detailed documentation.   Patient agreed to services and verbal consent obtained.   Assessment: Review of patient past medical history, allergies, medications, health status, including review of consultants reports, laboratory and other test data, was performed as part of comprehensive evaluation and provision of chronic care management services.   SDOH (Social Determinants of Health) assessments and interventions performed:    CCM Care Plan  Allergies  Allergen Reactions   Clopidogrel Anaphylaxis and Other (See Comments)    altered mental status;  (electrolytes "out of wack" and confusion per family; THEY DO NOT REMEMBER ANY OTHER REACTION)- MSB 10/10/15   Ciprofloxacin Itching   Mirtazapine Diarrhea    Bad dreams    Spironolactone Other (See Comments)    gynecomastia    Outpatient Encounter Medications as of 05/01/2020  Medication Sig Note   acetaminophen (TYLENOL) 500 MG tablet Take 1,000 mg by mouth every 6 (six) hours as needed for moderate pain or headache.     Alcohol Swabs (B-D SINGLE USE SWABS REGULAR) PADS Use to check blood sugar up to 2 times daily    allopurinol (ZYLOPRIM) 100 MG tablet Take 1 tablet (100 mg total) by mouth daily.    aspirin 81 MG chewable tablet Chew 81 mg by  mouth daily.    Blood Glucose Monitoring Suppl (TRUE METRIX METER) DEVI Use to check blood sugar up to 2 times daily    bosutinib (BOSULIF) 100 MG tablet Take 100 mg by mouth at bedtime as needed (tired/not feeling well). Take with food.     EASY TOUCH PEN NEEDLES 31G X 8 MM MISC USE 1 NEEDLE DAILY WITH LEVEMIR    empagliflozin (JARDIANCE) 10 MG TABS tablet Take 1 tablet (10 mg total) by mouth daily before breakfast.    gabapentin (NEURONTIN) 300 MG capsule TAKE TWO CAPSULES BY MOUTH TWICE DAILY MAX DOSE = 1200MG DAILY FOR KIDNEY PROTECTION    hydrALAZINE (APRESOLINE) 25 MG tablet Take 1 tablet (25 mg total) by mouth 3 (three) times daily.    Insulin Glargine ([name] KWIKPEN) 100 UNIT/ML Inject 20 Units into the skin daily.    Insulin Pen Needle 31G X 5 MM MISC 31 g by Does not apply route as directed.    isosorbide mononitrate (IMDUR) 60 MG 24 hr tablet Take 1 tablet (60 mg total) by mouth daily.    levothyroxine (SYNTHROID) 25 MCG tablet TAKE ONE TABLET BY MOUTH EVERY MORNING BEFORE BREAKFAST    loperamide (IMODIUM A-D) 2 MG tablet Take by mouth.     losartan (COZAAR) 25 MG tablet Take 1 tablet (25 mg total) by mouth daily.    Menthol-Zinc Oxide (GOLD BOND EX) Apply 1 application topically daily as needed (muscle pain).    metoprolol succinate (TOPROL-XL) 25 MG 24 hr tablet TAKE ONE TABLET BY MOUTH ONCE DAILY  mirabegron ER (MYRBETRIQ) 25 MG TB24 tablet Take 1 tablet (25 mg total) by mouth daily. From Urology    Multiple Vitamin (MULTIVITAMIN WITH MINERALS) TABS tablet Take 1 tablet by mouth daily.    mupirocin ointment (BACTROBAN) 2 % Apply 1 application topically 2 (two) times daily. For up to 2 weeks then stop. May repeat in future as needed, can use to help prevent infection of toe.    nitroGLYCERIN (NITROSTAT) 0.4 MG SL tablet Place 1 tablet (0.4 mg total) under the tongue every 5 (five) minutes x 3 doses as needed for chest pain.    omeprazole (PRILOSEC) 20 MG  capsule Take 20 mg by mouth in the morning.     Polyethyl Glycol-Propyl Glycol (SYSTANE OP) Place 1 drop into both eyes 3 (three) times daily as needed (dry/irritated eyes.).     polyethylene glycol (MIRALAX / GLYCOLAX) 17 g packet Take 17 g by mouth daily as needed for mild constipation.     potassium chloride SA (KLOR-CON) 20 MEQ tablet Take 2 tablets (40 mEq total) by mouth daily.    ranolazine (RANEXA) 500 MG 12 hr tablet Take 1 tablet (500 mg total) by mouth 2 (two) times daily.    RELION TRUE METRIX TEST STRIPS test strip Use to check blood sugar up to 2 times daily    rosuvastatin (CRESTOR) 5 MG tablet TAKE ONE TABLET BY MOUTH ONCE DAILY    simethicone (MYLICON) 80 MG chewable tablet Chew 80 mg by mouth every 6 (six) hours as needed for flatulence.    tamsulosin (FLOMAX) 0.4 MG CAPS capsule Take 1 capsule (0.4 mg total) by mouth daily after supper.    torsemide (DEMADEX) 20 MG tablet Take 3 tablets (60 mg total) by mouth daily.    traMADol (ULTRAM) 50 MG tablet TAKE ONE TO TWO TABLETS BY MOUTH AT BEDTIME AS NEEDED    TRULICITY 1.5 CH/8.8FO SOPN Inject 1.5 mg into the skin once a week. 02/04/2020: On Saturdays   warfarin (COUMADIN) 3 MG tablet TAKE 1 TO 1 & 1/2 TABLETS BY MOUTH AS DIRECTED BY THE COUMADIN CLINIC    No facility-administered encounter medications on file as of 05/01/2020.    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 ([place]) 08/24/2018   CKD (chronic kidney disease), stage IV ([place]) 06/04/2018   Osteoarthritis of knees, bilateral 06/02/2018   Hyperlipidemia associated with type  2 diabetes mellitus ([place]) 05/06/2018   Pain of lower extremity 03/06/2018   Duodenitis 01/30/2018   Chronic heart failure with preserved ejection fraction (HFpEF) ([place]) 01/23/2018   Hematochezia    Melena    Rectal bleeding 01/11/2018   AVM (arteriovenous malformation) of colon without hemorrhage    Schatzki's ring    Esophageal dysphagia    Diabetic retinopathy ([place]) 07/08/2017   Ischemic cardiomyopathy 06/26/2017   Cardiomyopathy ([place]) 06/26/2017   Centrilobular emphysema ([place]) 06/13/2017   Fatigue 12/18/2016   Stable angina ([place]) 09/20/2016   Stage 3a chronic kidney disease ([place]) 09/20/2016   Paroxysmal atrial fibrillation ([place]) 08/28/2016   Chronic anticoagulation 08/28/2016   Essential hypertension 08/20/2016   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 heart with  stable angina pectoris ([place]) 08/19/2016   Accelerating angina ([place]) 08/15/2016   Exertional dyspnea 08/12/2016   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   Gynecomastia, male 03/31/2014   Hypertrophy of breast 03/31/2014   Nuclear sclerosis of left eye 10/22/2013   Enthesopathy of ankle and tarsus 04/10/2013   Controlled type 2 diabetes mellitus with diabetic nephropathy ([place]) 04/09/2013   Diabetic neuropathy associated with type 2 diabetes mellitus ([place]) 04/09/2013   Cataract of left eye 11/25/2011   Corneal opacity 09/30/2011   Pseudophakia of right eye 09/30/2011   GERD (gastroesophageal reflux disease) 09/25/2011   Gout 09/25/2011   Hyperlipidemia LDL goal <70 09/25/2011   Status post cataract extraction 04/11/2011   Cataract extraction status of eye 04/11/2011   Iron deficiency anemia 04/01/2011   Benign localized hyperplasia of prostate without urinary obstruction and other lower urinary tract symptoms (LUTS) 12/10/2010   Enlarged prostate without lower urinary tract symptoms (luts) 12/10/2010   Colon cancer screening 10/11/2010   Hypothyroidism 10/11/2010    Hyperlipidemia 09/20/2003   Essential and other specified forms of tremor 01/05/2001   Essential tremor 01/05/2001   Acute on chronic HFrEF (heart failure with reduced ejection fraction) ([place]) 06/22/1993    Conditions to be addressed/monitored:CHF, HTN and DMII  Care Plan : RNCM: Heart Failure (Adult)  Updates made by Vanita Ingles since 05/01/2020 12:00 AM    Problem: RNCM: Symptom Exacerbation (Heart Failure)   Priority: Medium    Goal: RNCM: Symptom Exacerbation Prevented or Minimized   Priority: Medium  Note:   Current Barriers:   Knowledge deficits related to basic heart failure pathophysiology and self care management  Unable to independently manage HF  Unable to self administer medications as prescribed  Lacks social connections  Does not contact provider office for questions/concerns  Lack of scale in home- has scales and uses a mat supplied by cardiology clinic- weighs daily and is monitored daily by the clinic   Financial strain  Nurse Case Manager Clinical Goal(s):   Over the next 120 days, patient will weigh self daily and record  Over the next 120 days, patient will verbalize understanding of Heart Failure Action Plan and when to call doctor  Over the next 120 days, patient will take all Heart Failure mediations as prescribed Interventions:   Collaboration with [name], DO regarding development and update of comprehensive plan of care as evidenced by provider attestation and co-signature  Inter-disciplinary care team collaboration (see longitudinal plan of care)  Basic overview and discussion of pathophysiology of Heart Failure  Provided written and verbal education on low sodium diet- 05-01-2020: The patients wife states he does not have a good appetite but he is adherent to heart healthy diet   Reviewed Heart Failure Action Plan in depth and provided written copy. 05-01-2020: The patient continues to be monitored at home by using a  device that he lays on daily and sends to the heart failure clinic. The patient has not had any recent exacerbations.   Assessed for scales in home  Discussed importance of daily weight- 05-01-2020: The patient is compliant with daily weights   Reviewed role of diuretics in prevention of fluid overload  Patient Goals/Self-Care Activities  Over the next 120 days, patient will:  - Take Heart Failure Medications as prescribed - Weigh daily and record (notify MD with 3 lb weight gain over night or 5 lb in a week) weight today was 201 - Follow CHF Action Plan - Adhere to low sodium diet -  barriers to lifestyle changes reviewed and addressed - barriers to treatment reviewed and addressed - depression screen reviewed - healthy lifestyle promoted - medication-adherence assessment completed - rescue (action) plan developed - rescue (action) plan reviewed - self-awareness of signs/symptoms of worsening disease encouraged Follow Up Plan: Telephone follow up appointment with care management team member scheduled for: 06-26-2020 at 55 am   Care Plan : RNCM: Hypertension (Adult)  Updates made by Vanita Ingles since 05/01/2020 12:00 AM    Problem: RNCM: Hypertension (Hypertension)   Priority: Medium    Goal: RNCM: Hypertension Monitored   Priority: Medium  Note:   Objective:   Last practice recorded BP readings:   BP Readings from Last 3 Encounters:   04/18/20  122/62   04/13/20  129/75   02/17/20  109/68      Most recent eGFR/CrCl: No results found for: EGFR  No components found for: CRCL Current Barriers:   Knowledge Deficits related to basic understanding of hypertension pathophysiology and self care management  Knowledge Deficits related to understanding of medications prescribed for management of hypertension  Limited Social Support  Unable to independently manage HTN  Lacks social connections  Does not contact provider office for questions/concerns Case Manager  Clinical Goal(s):   Over the next 120 days, patient will verbalize understanding of plan for hypertension management  Over the next 120 days, patient will attend all scheduled medical appointments: sees cardiologist regularly , pcp on 06-14-2020  Over the next 120 days, patient will demonstrate improved adherence to prescribed treatment plan for hypertension as evidenced by taking all medications as prescribed, monitoring and recording blood pressure as directed, adhering to low sodium/DASH diet  Over the next 120 days, patient will demonstrate improved health management independence as evidenced by checking blood pressure as directed and notifying PCP if SBP>160 or DBP > 80, taking all medications as prescribe, and adhering to a low sodium diet as discussed.  Over the next 120 days, patient will verbalize basic understanding of hypertension disease process and self health management plan as evidenced by compliance with diet restrictions, compliance with medications, working with CCM team for optimal management of health and well being.  Interventions:   Collaboration with [name], DO regarding development and update of comprehensive plan of care as evidenced by provider attestation and co-signature  Inter-disciplinary care team collaboration (see longitudinal plan of care)  UNABLE to independently:manage HTN  Evaluation of current treatment plan related to hypertension self management and patient's adherence to plan as established by provider.  Provided education to patient re: stroke prevention, s/s of heart attack and stroke, DASH diet, complications of uncontrolled blood pressure  Reviewed medications with patient and discussed importance of compliance. 05-01-2020: Followed closely by pham D and also verbalized compliance   Discussed plans with patient for ongoing care management follow up and provided patient with direct contact information for care management  team  Advised patient, providing education and rationale, to monitor blood pressure daily and record, calling PCP for findings outside established parameters.   Reviewed scheduled/upcoming provider appointments including: 06-14-2020 at 240 pm Patient Goals/Self-Care Activities  Over the next 120 days, patient will:  - Self administers medications as prescribed Attends all scheduled provider appointments Calls provider office for new concerns, questions, or BP outside discussed parameters Checks BP and records as discussed Follows a low sodium diet/DASH diet - blood pressure trends reviewed - depression screen reviewed - home or ambulatory blood pressure monitoring encouraged Follow Up Plan: Telephone follow  up appointment with care management team member scheduled for: 06-26-2020 at 10:30 am    Care Plan : RNCM: Diabetes Type 2 (Adult)  Updates made by Vanita Ingles since 05/01/2020 12:00 AM    Problem: RNCM: Glycemic Management (Diabetes, Type 2)   Priority: Medium    Goal: RNCM: Glycemic Management Optimized   Priority: Medium  Note:   Objective:  Lab Results  Component Value Date   HGBA1C 7.4 (A) 12/22/2019    Lab Results  Component Value Date   CREATININE 1.60 (H) 02/21/2020   CREATININE 1.72 (H) 02/03/2020   CREATININE 1.65 (H) 11/24/2019     No results found for: EGFR Current Barriers:   Knowledge Deficits related to basic Diabetes pathophysiology and self care/management  Knowledge Deficits related to medications used for management of diabetes  Difficulty obtaining or cannot afford medications  Limited Social Support  Unable to self administer medications as prescribed  Lacks social connections  Does not contact provider office for questions/concerns Case Manager Clinical Goal(s):   Collaboration with [name], DO regarding development and update of comprehensive plan of care as evidenced by provider attestation and  co-signature  Inter-disciplinary care team collaboration (see longitudinal plan of care)  Over the next 120 days, patient will demonstrate improved adherence to prescribed treatment plan for diabetes self care/management as evidenced by:   daily monitoring and recording of CBG- 05-01-2020: Per the patients wife his blood sugars are 120-156 and staying under 200  adherence to ADA/ carb modified diet  adherence to prescribed medication regimen Interventions:   Provided education to patient about basic DM disease process  Reviewed medications with patient and discussed importance of medication adherence  Discussed plans with patient for ongoing care management follow up and provided patient with direct contact information for care management team  Provided patient with written educational materials related to hypo and hyperglycemia and importance of correct treatment  Reviewed scheduled/upcoming provider appointments including: 06-14-2020  Advised patient, providing education and rationale, to check cbg bid  and record, calling pcp for findings outside established parameters.    Review of patient status, including review of consultants reports, relevant laboratory and other test results, and medications completed. Patient Goals/Self-Care Activities  Over the next 120 days, patient will:  - Self administers oral medications as prescribed Self administers insulin as prescribed Attends all scheduled provider appointments Checks blood sugars as prescribed and utilize hyper and hypoglycemia protocol as needed Adheres to prescribed ADA/carb modified - barriers to adherence to treatment plan identified - blood glucose monitoring encouraged - blood glucose readings reviewed - individualized medical nutrition therapy provided - resources required to improve adherence to care identified - self-awareness of signs/symptoms of hypo or hyperglycemia encouraged - use of blood glucose monitoring  log promoted Follow Up Plan: Telephone follow up appointment with care management team member scheduled for: 06-26-2020 at 60 am      Plan:Telephone follow up appointment with care management team member scheduled for:  06-26-2020 at 66 am  [place], MSN, [place] Mobile: [phone]

## 2020-05-01 NOTE — Patient Instructions (Signed)
Visit Information  PATIENT GOALS: Patient Care Plan: RNCM: Heart Failure (Adult)    Problem Identified: RNCM: Symptom Exacerbation (Heart Failure)   Priority: Medium    Goal: RNCM: Symptom Exacerbation Prevented or Minimized   Priority: Medium  Note:   Current Barriers:  [name] Knowledge deficits related to basic heart failure pathophysiology and self care management . Unable to independently manage HF . Unable to self administer medications as prescribed . Lacks social connections . Does not contact provider office for questions/concerns . Lack of scale in home- has scales and uses a mat supplied by cardiology clinic- weighs daily and is monitored daily by the clinic  . Financial strain  Occupational hygienist):   Over the next 120 days, patient will weigh self daily and record  Over the next 120 days, patient will verbalize understanding of Heart Failure Action Plan and when to call doctor  Over the next 120 days, patient will take all Heart Failure mediations as prescribed Interventions:  . Collaboration with [name], DO regarding development and update of comprehensive plan of care as evidenced by provider attestation and co-signature . Inter-disciplinary care team collaboration (see longitudinal plan of care) . Basic overview and discussion of pathophysiology of Heart Failure . Provided written and verbal education on low sodium diet- 05-01-2020: The patients wife states he does not have a good appetite but he is adherent to heart healthy diet  . Reviewed Heart Failure Action Plan in depth and provided written copy. 05-01-2020: The patient continues to be monitored at home by using a device that he lays on daily and sends to the heart failure clinic. The patient has not had any recent exacerbations.  . Assessed for scales in home . Discussed importance of daily weight- 05-01-2020: The patient is compliant with daily weights  . Reviewed role of diuretics in  prevention of fluid overload  Patient Goals/Self-Care Activities . Over the next 120 days, patient will:  - Take Heart Failure Medications as prescribed - Weigh daily and record (notify MD with 3 lb weight gain over night or 5 lb in a week) weight today was 201 - Follow CHF Action Plan - Adhere to low sodium diet - barriers to lifestyle changes reviewed and addressed - barriers to treatment reviewed and addressed - depression screen reviewed - healthy lifestyle promoted - medication-adherence assessment completed - rescue (action) plan developed - rescue (action) plan reviewed - self-awareness of signs/symptoms of worsening disease encouraged Follow Up Plan: Telephone follow up appointment with care management team member scheduled for: 06-26-2020 at 43 am   Task: RNCM: Identify and Minimize Risk of Heart Failure Exacerbation   Note:   Care Management Activities:    - barriers to lifestyle changes reviewed and addressed - barriers to treatment reviewed and addressed - depression screen reviewed - healthy lifestyle promoted - medication-adherence assessment completed - rescue (action) plan developed - rescue (action) plan reviewed - self-awareness of signs/symptoms of worsening disease encouraged       Patient Care Plan: RNCM: Hypertension (Adult)    Problem Identified: RNCM: Hypertension (Hypertension)   Priority: Medium    Goal: RNCM: Hypertension Monitored   Priority: Medium  Note:   Objective:  . Last practice recorded BP readings:  . BP Readings from Last 3 Encounters: .  04/18/20 . 122/62 .  04/13/20 . 129/75 .  02/17/20 . 109/68 .    [name] Most recent eGFR/CrCl: No results found for: EGFR  No components found for: CRCL Current  Barriers:  [name] Knowledge Deficits related to basic understanding of hypertension pathophysiology and self care management . Knowledge Deficits related to understanding of medications prescribed for management of hypertension . Limited Social  Support . Unable to independently manage HTN . Lacks social connections . Does not contact provider office for questions/concerns Case Manager Clinical Goal(s):  [name] Over the next 120 days, patient will verbalize understanding of plan for hypertension management . Over the next 120 days, patient will attend all scheduled medical appointments: sees cardiologist regularly , pcp on 06-14-2020 . Over the next 120 days, patient will demonstrate improved adherence to prescribed treatment plan for hypertension as evidenced by taking all medications as prescribed, monitoring and recording blood pressure as directed, adhering to low sodium/DASH diet . Over the next 120 days, patient will demonstrate improved health management independence as evidenced by checking blood pressure as directed and notifying PCP if SBP>160 or DBP > 80, taking all medications as prescribe, and adhering to a low sodium diet as discussed. . Over the next 120 days, patient will verbalize basic understanding of hypertension disease process and self health management plan as evidenced by compliance with diet restrictions, compliance with medications, working with CCM team for optimal management of health and well being.  Interventions:  . Collaboration with [name], DO regarding development and update of comprehensive plan of care as evidenced by provider attestation and co-signature . Inter-disciplinary care team collaboration (see longitudinal plan of care) . UNABLE to independently:manage HTN . Evaluation of current treatment plan related to hypertension self management and patient's adherence to plan as established by provider. . Provided education to patient re: stroke prevention, s/s of heart attack and stroke, DASH diet, complications of uncontrolled blood pressure . Reviewed medications with patient and discussed importance of compliance. 05-01-2020: Followed closely by pham D and also verbalized compliance   . Discussed plans with patient for ongoing care management follow up and provided patient with direct contact information for care management team . Advised patient, providing education and rationale, to monitor blood pressure daily and record, calling PCP for findings outside established parameters.  . Reviewed scheduled/upcoming provider appointments including: 06-14-2020 at 240 pm Patient Goals/Self-Care Activities . Over the next 120 days, patient will:  - Self administers medications as prescribed Attends all scheduled provider appointments Calls provider office for new concerns, questions, or BP outside discussed parameters Checks BP and records as discussed Follows a low sodium diet/DASH diet - blood pressure trends reviewed - depression screen reviewed - home or ambulatory blood pressure monitoring encouraged Follow Up Plan: Telephone follow up appointment with care management team member scheduled for: 06-26-2020 at 10:30 am    Task: RNCM: Identify and Monitor Blood Pressure Elevation   Note:   Care Management Activities:    - blood pressure trends reviewed - depression screen reviewed - home or ambulatory blood pressure monitoring encouraged      Patient Care Plan: RNCM: Diabetes Type 2 (Adult)    Problem Identified: RNCM: Glycemic Management (Diabetes, Type 2)   Priority: Medium    Goal: RNCM: Glycemic Management Optimized   Priority: Medium  Note:   Objective:  Lab Results  Component Value Date   HGBA1C 7.4 (A) 12/22/2019 .   Lab Results  Component Value Date   CREATININE 1.60 (H) 02/21/2020   CREATININE 1.72 (H) 02/03/2020   CREATININE 1.65 (H) 11/24/2019 .   [name] No results found for: EGFR Current Barriers:  [name] Knowledge Deficits related to basic Diabetes pathophysiology and  self care/management . Knowledge Deficits related to medications used for management of diabetes . Difficulty obtaining or cannot afford medications . Limited Social Support . Unable to  self administer medications as prescribed . Lacks social connections . Does not contact provider office for questions/concerns Case Manager Clinical Goal(s):  [name] Collaboration with [name], DO regarding development and update of comprehensive plan of care as evidenced by provider attestation and co-signature . Inter-disciplinary care team collaboration (see longitudinal plan of care) . Over the next 120 days, patient will demonstrate improved adherence to prescribed treatment plan for diabetes self care/management as evidenced by:  . daily monitoring and recording of CBG- 05-01-2020: Per the patients wife his blood sugars are 120-156 and staying under 200 . adherence to ADA/ carb modified diet . adherence to prescribed medication regimen Interventions:  . Provided education to patient about basic DM disease process . Reviewed medications with patient and discussed importance of medication adherence . Discussed plans with patient for ongoing care management follow up and provided patient with direct contact information for care management team . Provided patient with written educational materials related to hypo and hyperglycemia and importance of correct treatment . Reviewed scheduled/upcoming provider appointments including: 06-14-2020 . Advised patient, providing education and rationale, to check cbg bid  and record, calling pcp for findings outside established parameters.   . Review of patient status, including review of consultants reports, relevant laboratory and other test results, and medications completed. Patient Goals/Self-Care Activities . Over the next 120 days, patient will:  - Self administers oral medications as prescribed Self administers insulin as prescribed Attends all scheduled provider appointments Checks blood sugars as prescribed and utilize hyper and hypoglycemia protocol as needed Adheres to prescribed ADA/carb modified - barriers to adherence to  treatment plan identified - blood glucose monitoring encouraged - blood glucose readings reviewed - individualized medical nutrition therapy provided - resources required to improve adherence to care identified - self-awareness of signs/symptoms of hypo or hyperglycemia encouraged - use of blood glucose monitoring log promoted Follow Up Plan: Telephone follow up appointment with care management team member scheduled for: 06-26-2020 at 1030 am    Task: RNCM: Alleviate Barriers to Glycemic Management   Note:   Care Management Activities:    - barriers to adherence to treatment plan identified - blood glucose monitoring encouraged - blood glucose readings reviewed - individualized medical nutrition therapy provided - resources required to improve adherence to care identified - self-awareness of signs/symptoms of hypo or hyperglycemia encouraged - use of blood glucose monitoring log promoted       Patient Care Plan: PharmD - Medication Management/Assistance    Problem Identified: Disease Progression     Long-Range Goal: Disease Progression Prevented or Minimized   Start Date: 04/14/2020  Expected End Date: 07/13/2020  Recent Progress: On track  Priority: High  Note:   Current Barriers:  . Chronic Disease Management support, education, and care coordination needs related to type 2 diabetes, CKD, HFrEF s/p CardioMEMS, hyperlipidemia, atrial fibrillation, hypertension, coronary artery disease, CHF, hypothyroidism, and CML . Financial o Note patient has Partial Extra Help/LIS Subsidy o Patient enrolled in [employer] practitioner and [employer]) patient assistance program for 2022 calendar year o Patient denied for patient assistance for Lorenso Quarry from [employer] for 2022 calendar year . Limited vision  Pharmacist Clinical Goal(s):  [name] Over the next 90 days, patient will verbalize ability to afford treatment regimen through collaboration with PharmD and provider.   .   Interventions: . 1:1 collaboration  with [name], DO regarding development and update of comprehensive plan of care as evidenced by provider attestation and co-signature . Inter-disciplinary care team collaboration (see longitudinal plan of care) . Receive call from [employer] [place]. She received a call from patient's wife requesting help with follow up contact information for person who was helping with getting handrails installed in their home.  o From review of chart note, note Care Guide previously reached out to patient and placed referral to [facility]. . Outreach to [place] who advises that she will get in touch with Sharyn Lull at [facility] to get an update on the status of this referral and then reach out to patient as well. . Outreach to patient's wife today to provide this update from and contact info for Care Guide. o Mrs. Capri also lets me know that she now has a supply of Jardiance as picked up from [facility] (received from [facility]) o Will follow up with patient/wife regarding diabetes management as previously scheduled on 2/25.   Patient Goals/Self-Care Activities . Over the next 90 days, patient will:  . Check blood sugar, blood pressure and daily weight as directed by providers  . Take medications, with assistance of wife, as directed o Mrs. Ashbaugh manages patient's medications using weekly pillbox as adherence tool . Attends scheduled medical appointments  Follow Up Plan: Telephone follow up appointment with care management team member scheduled for: 05/12/2020 10:00 AM    Task: Alleviate Barriers to Chronic Kidney Disease Treatment    The patient verbalized understanding of instructions, educational materials, and care plan provided today and declined offer to receive copy of patient instructions, educational materials, and care plan.   Telephone follow up appointment with care management team  member scheduled for:06-26-2020 at 12 am  [place], MSN, [place] Mobile: [phone]

## 2020-05-02 DIAGNOSIS — E1122 Type 2 diabetes mellitus with diabetic chronic kidney disease: Secondary | ICD-10-CM | POA: Diagnosis not present

## 2020-05-02 DIAGNOSIS — I10 Essential (primary) hypertension: Secondary | ICD-10-CM | POA: Diagnosis not present

## 2020-05-02 DIAGNOSIS — N1832 Chronic kidney disease, stage 3b: Secondary | ICD-10-CM | POA: Diagnosis not present

## 2020-05-02 DIAGNOSIS — N400 Enlarged prostate without lower urinary tract symptoms: Secondary | ICD-10-CM | POA: Diagnosis not present

## 2020-05-04 ENCOUNTER — Other Ambulatory Visit: Payer: Self-pay | Admitting: Family Medicine

## 2020-05-04 DIAGNOSIS — Z794 Long term (current) use of insulin: Secondary | ICD-10-CM

## 2020-05-04 DIAGNOSIS — E1121 Type 2 diabetes mellitus with diabetic nephropathy: Secondary | ICD-10-CM

## 2020-05-04 MED ORDER — TRUE METRIX METER DEVI: 0 refills | Status: DC

## 2020-05-04 NOTE — Telephone Encounter (Signed)
Medication Refill - Medication: true metrix testing strips. Pt checks bs twice a day. Pt needs enough for 90 day supply  Has the patient contacted their pharmacy?yes pt wife states [facility] order pharm told her to call the md office [facility] [place] Agent: Please be advised that RX refills may take up to 3 business days. We ask that you follow-up with your pharmacy.

## 2020-05-10 ENCOUNTER — Other Ambulatory Visit: Payer: Self-pay

## 2020-05-10 ENCOUNTER — Ambulatory Visit (INDEPENDENT_AMBULATORY_CARE_PROVIDER_SITE_OTHER): Payer: Medicare HMO

## 2020-05-10 DIAGNOSIS — Z7901 Long term (current) use of anticoagulants: Secondary | ICD-10-CM | POA: Diagnosis not present

## 2020-05-10 DIAGNOSIS — I480 Paroxysmal atrial fibrillation: Secondary | ICD-10-CM

## 2020-05-10 LAB — POCT INR: INR: 1.8 — AB (ref 2.0–3.0)

## 2020-05-10 NOTE — Patient Instructions (Signed)
-   take 2 tablets tonight, then  - continue warfarin of 1 TABLET EVERY DAY EXCEPT 1.5 TABLETS ON MONDAYS, [place]. - recheck INR in 6 weeks.

## 2020-05-12 ENCOUNTER — Ambulatory Visit: Payer: Medicare HMO | Admitting: Pharmacist

## 2020-05-12 DIAGNOSIS — I10 Essential (primary) hypertension: Secondary | ICD-10-CM

## 2020-05-12 DIAGNOSIS — I5032 Chronic diastolic (congestive) heart failure: Secondary | ICD-10-CM

## 2020-05-12 DIAGNOSIS — Z794 Long term (current) use of insulin: Secondary | ICD-10-CM | POA: Diagnosis not present

## 2020-05-12 DIAGNOSIS — E1121 Type 2 diabetes mellitus with diabetic nephropathy: Secondary | ICD-10-CM

## 2020-05-12 NOTE — Patient Instructions (Signed)
Visit Information  PATIENT GOALS: Goals Addressed            This Visit's Progress   . Pharmacy Goals       Please check your blood sugar, keep a log of the results and bring this with you to your medical appointments.  Please check your home blood pressure, keep a log of the results and bring this with you to your medical appointments.  Our goal bad cholesterol, or LDL, is less than 70 . This is why it is important to continue taking your rosuvastatin.  Feel free to call me with any questions or concerns. I look forward to our next call!  [name], PharmD, [place] [phone]       The patient verbalized understanding of instructions, educational materials, and care plan provided today and declined offer to receive copy of patient instructions, educational materials, and care plan.   Telephone follow up appointment with care management team member scheduled for: 07/05/2020 at 11:15 AM  [name], PharmD, [place] [phone]

## 2020-05-12 NOTE — Chronic Care Management (AMB) (Signed)
Chronic Care Management Pharmacy Note  05/12/2020 Name:  Lucas Caldwell MRN:  [id number] DOB:  1933-07-31  Subjective: Lucas Caldwell is an 87 y.o. year old male who is a primary patient of [name], DO.  The CCM team was consulted for assistance with disease management and care coordination needs.    Engaged with patient and spouse by telephone for follow up visit in response to provider referral for pharmacy case management and/or care coordination services.   Consent to Services:  The patient was given information about Chronic Care Management services, agreed to services, and gave verbal consent prior to initiation of services.  Please see initial visit note for detailed documentation.   Patient Care Team: [name], DO as PCP - General (Family Medicine) End, [name], MD as PCP - Cardiology (Cardiology) Marquay Kruse, [name], [place] as Pharmacist Vanita Ingles, RN as Case Manager (General Practice)   Recent consult visits: Office Visit with Nephrology - follow up on 2/15 [facility] Visit on 2/23  Hospital visits: None in previous 6 months  Objective:  Lab Results  Component Value Date/Time   HGBA1C 7.4 (A) 12/22/2019 04:10 PM   HGBA1C 7.1 (A) 09/08/2019 04:11 PM   HGBA1C 7.6 (H) 04/13/2019 07:19 PM   HGBA1C 10.4 (H) 12/20/2016 04:30 AM   MICROALBUR Negative 02/02/2019 04:41 PM    Last diabetic Eye exam:  Lab Results  Component Value Date/Time   HMDIABEYEEXA Retinopathy (A) 12/07/2019 12:00 AM     Lab Results  Component Value Date   CHOL 110 08/28/2018   HDL 28 (L) 08/28/2018   LDLCALC 51 08/28/2018   TRIG 155 (H) 08/28/2018   CHOLHDL 3.9 08/28/2018     Depression screen [facility] 2/9 12/22/2019 10/25/2019 09/09/2019  Decreased Interest 0 2 0  Down, Depressed, Hopeless 0 0 1  PHQ - 2 Score 0 2 1  Altered sleeping - 2 1  Tired, decreased energy - 1 1  Change in appetite - 0 2  Feeling bad or failure about yourself  - 1 0   Trouble concentrating - 0 0  Moving slowly or fidgety/restless - 0 0  Suicidal thoughts - 0 0  PHQ-9 Score - 6 5  Difficult doing work/chores - Somewhat difficult Somewhat difficult  Some recent data might be hidden     Social History   Tobacco Use  Smoking Status Former Smoker  . Packs/day: 1.75  . Years: 20.00  . Pack years: 35.00  . Types: Cigarettes  . Start date: 39  . Quit date: 71  . Years since quitting: 47.1  Smokeless Tobacco Former User   BP Readings from Last 3 Encounters:  04/18/20 122/62  04/13/20 129/75  02/17/20 109/68   Pulse Readings from Last 3 Encounters:  04/18/20 82  04/13/20 85  02/17/20 92   Wt Readings from Last 3 Encounters:  04/18/20 203 lb (92.1 kg)  04/13/20 204 lb (92.5 kg)  02/17/20 203 lb (92.1 kg)    Assessment/Interventions: Review of patient past medical history, allergies, medications, health status, including review of consultants reports, laboratory and other test data, was performed as part of comprehensive evaluation and provision of chronic care management services.   SDOH:  (Social Determinants of Health) assessments and interventions performed: No   CCM Care Plan  Allergies  Allergen Reactions  . Clopidogrel Anaphylaxis and Other (See Comments)    altered mental status;  (electrolytes "out of wack" and confusion per family; THEY DO NOT REMEMBER ANY  OTHER REACTION)- MSB 10/10/15  . Ciprofloxacin Itching  . Mirtazapine Diarrhea    Bad dreams   . Spironolactone Other (See Comments)    gynecomastia    Medications Reviewed Today    Reviewed by Ronaldo Miyamoto, CMA (Certified Medical Assistant) on 04/18/20 at [place] List Status: <None>  Medication Order Taking? Sig Documenting Provider Last Dose Status Informant  acetaminophen (TYLENOL) 500 MG tablet [id number] Yes Take 1,000 mg by mouth every 6 (six) hours as needed for moderate pain or headache.  [provider] Taking Active Spouse/Significant Other   Alcohol Swabs (B-D SINGLE USE SWABS REGULAR) PADS [id number] Yes Use to check blood sugar up to 2 times daily [name], DO Taking Active   allopurinol (ZYLOPRIM) 100 MG tablet [id number] Yes Take 1 tablet (100 mg total) by mouth daily. Lyda Jester M, PA-C Taking Active Spouse/Significant Other  aspirin 81 MG chewable tablet [id number] Yes Chew 81 mg by mouth daily. [provider] Taking Active Spouse/Significant Other  Blood Glucose Monitoring Suppl (TRUE METRIX METER) DEVI [id number] Yes Use to check blood sugar up to 2 times daily [name], DO Taking Active   bosutinib (BOSULIF) 100 MG tablet [id number] Yes Take 100 mg by mouth at bedtime as needed (tired/not feeling well). Take with food.  [provider] Taking Active Spouse/Significant Other           Med Note Jeanie Cooks Sep 15, 2019  8:38 AM)    EASY TOUCH PEN NEEDLES 31G X 8 MM MISC [id number] Yes USE 1 NEEDLE DAILY WITH LEVEMIR Karamalegos, [name], DO Taking Active   empagliflozin (JARDIANCE) 10 MG TABS tablet [id number] Yes Take 1 tablet (10 mg total) by mouth daily before breakfast. Bensimhon, Shaune Pascal, MD Taking Active   gabapentin (NEURONTIN) 300 MG capsule [id number] Yes TAKE TWO CAPSULES BY MOUTH TWICE DAILY MAX DOSE = 1200MG  DAILY FOR KIDNEY PROTECTION [name], DO Taking Active   hydrALAZINE (APRESOLINE) 25 MG tablet [id number] Yes Take 1 tablet (25 mg total) by mouth 3 (three) times daily. Theora Gianotti, NP Taking Expired 02/02/20 2359   Insulin Glargine ([name] KWIKPEN) 100 UNIT/ML [id number] Yes Inject 20 Units into the skin daily. [provider] Taking Active   Insulin Pen Needle 31G X 5 MM MISC [id number] Yes 31 g by Does not apply route as directed. [name], NP Taking Active Spouse/Significant Other  isosorbide mononitrate (IMDUR) 60 MG 24 hr tablet [id number] Yes Take 1 tablet (60 mg total) by mouth daily. End,  [name], MD Taking Active   levothyroxine (SYNTHROID) 25 MCG tablet [id number] Yes TAKE ONE TABLET BY MOUTH EVERY MORNING BEFORE BREAKFAST Karamalegos, [name], DO Taking Active   loperamide (IMODIUM A-D) 2 MG tablet [id number] Yes Take by mouth.  [provider] Taking Active   losartan (COZAAR) 25 MG tablet [id number] Yes Take 2 tablets (50 mg total) by mouth daily. Bensimhon, Shaune Pascal, MD Taking Active   Menthol-Zinc Oxide (GOLD [place]) [id number] Yes Apply 1 application topically daily as needed (muscle pain). [provider] Taking Active Spouse/Significant Other  metoprolol succinate (TOPROL-XL) 25 MG 24 hr tablet [id number] Yes TAKE ONE TABLET BY MOUTH ONCE DAILY End, [name], MD Taking Active   mirabegron ER (MYRBETRIQ) 25 MG TB24 tablet [id number] Yes Take 1 tablet (25 mg total) by mouth daily. From Urology Debroah Loop, PA-C Taking Active   Multiple Vitamin (MULTIVITAMIN WITH MINERALS) TABS tablet [id number] Yes Take 1  tablet by mouth daily. [provider] Taking Active Spouse/Significant Other  mupirocin ointment (BACTROBAN) 2 % [id number] Yes Apply 1 application topically 2 (two) times daily. For up to 2 weeks then stop. May repeat in future as needed, can use to help prevent infection of toe. [name], DO Taking Active   nitroGLYCERIN (NITROSTAT) 0.4 MG SL tablet [id number] Yes Place 1 tablet (0.4 mg total) under the tongue every 5 (five) minutes x 3 doses as needed for chest pain. Theora Gianotti, NP Taking Active   omeprazole (PRILOSEC) 20 MG capsule [id number] Yes Take 20 mg by mouth in the morning.  [provider] Taking Active Spouse/Significant Other  Polyethyl Glycol-Propyl Glycol (SYSTANE OP) [id number] Yes Place 1 drop into both eyes 3 (three) times daily as needed (dry/irritated eyes.).  [provider] Taking Active Spouse/Significant Other  polyethylene glycol (MIRALAX / GLYCOLAX) 17 g packet  [id number] Yes Take 17 g by mouth daily as needed for mild constipation.  [provider] Taking Active Spouse/Significant Other  potassium chloride SA (KLOR-CON) 20 MEQ tablet [id number] Yes Take 2 tablets (40 mEq total) by mouth daily. [name], DO Taking Active   ranolazine (RANEXA) 500 MG 12 hr tablet [id number] Yes Take 1 tablet (500 mg total) by mouth 2 (two) times daily. End, [name], MD Taking Active Spouse/Significant Other  RELION TRUE METRIX TEST STRIPS test strip [id number] Yes Use to check blood sugar up to 2 times daily [name], DO Taking Active   rosuvastatin (CRESTOR) 5 MG tablet [id number] Yes TAKE ONE TABLET BY MOUTH ONCE DAILY End, [name], MD Taking Active   simethicone (MYLICON) 80 MG chewable tablet [id number] Yes Chew 80 mg by mouth every 6 (six) hours as needed for flatulence. [provider] Taking Active   tamsulosin (FLOMAX) 0.4 MG CAPS capsule [id number] Yes Take 1 capsule (0.4 mg total) by mouth daily after supper. Rise Mu, PA-C Taking Active   torsemide (DEMADEX) 20 MG tablet [id number] Yes Take 3 tablets (60 mg total) by mouth daily. Lyda Jester M, PA-C Taking Active   traMADol (ULTRAM) 50 MG tablet [id number] Yes TAKE ONE TO TWO TABLETS BY MOUTH AT BEDTIME AS NEEDED [name], DO Taking Active   TRULICITY 1.5 TG/2.5WL SOPN [id number] Yes Inject 1.5 mg into the skin once a week. [name], DO Taking Active            Med Note Winfield Cunas, Ashliegh Parekh A   Fri Feb 04, 2020  4:09 PM) On Saturdays  warfarin (COUMADIN) 3 MG tablet [id number] Yes TAKE 1 TO 1 & 1/2 TABLETS BY MOUTH ONCE DAILY AS DIRECTED BY THE COUMADIN CLINIC End, [name], MD Taking Active           Patient Active Problem List   Diagnosis Date Noted  . Urge incontinence of urine 12/22/2019  . Trifascicular block 10/20/2019  . Wound infection 10/08/2019  . Bifascicular block 05/13/2019  . Coronary stent patent  04/13/2019  . Chronic HFrEF (heart failure with reduced ejection fraction) ([place]) 03/19/2019  . PSVT (paroxysmal supraventricular tachycardia) ([place]) 03/19/2019  . 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 CKD (chronic kidney disease), stage IV ([place]) 06/04/2018  . Osteoarthritis of knees, bilateral 06/02/2018  . Hyperlipidemia associated with type 2 diabetes mellitus ([place]) 05/06/2018  . Pain of lower extremity 03/06/2018  . Duodenitis 01/30/2018  . Chronic heart failure with preserved ejection fraction (HFpEF) ([place]) 01/23/2018  . Hematochezia   .  Melena   . Rectal bleeding 01/11/2018  . AVM (arteriovenous malformation) of colon without hemorrhage   . Schatzki's ring   . Esophageal dysphagia   . Diabetic retinopathy ([place]) 07/08/2017  . Ischemic cardiomyopathy 06/26/2017  . Cardiomyopathy ([place]) 06/26/2017  . Centrilobular emphysema ([place]) 06/13/2017  . Fatigue 12/18/2016  . Stable angina ([place]) 09/20/2016  . Stage 3a chronic kidney disease ([place]) 09/20/2016  . Paroxysmal atrial fibrillation ([place]) 08/28/2016  . Chronic anticoagulation 08/28/2016  . Essential hypertension 08/20/2016  . 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 heart with stable angina pectoris ([place]) 08/19/2016  . Accelerating angina ([place]) 08/15/2016  . Exertional dyspnea 08/12/2016  . 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 Gynecomastia, male 03/31/2014  . Hypertrophy of breast 03/31/2014  . Nuclear sclerosis of left eye 10/22/2013  . Enthesopathy of ankle and tarsus 04/10/2013  . Controlled type 2 diabetes mellitus with diabetic nephropathy ([place]) 04/09/2013  . Diabetic neuropathy associated with type 2 diabetes mellitus ([place]) 04/09/2013  . Cataract of left eye 11/25/2011  . Corneal opacity 09/30/2011  . Pseudophakia of right eye 09/30/2011  . GERD (gastroesophageal reflux disease) 09/25/2011  . Gout 09/25/2011  . Hyperlipidemia LDL goal <70 09/25/2011  . Status post cataract  extraction 04/11/2011  . Cataract extraction status of eye 04/11/2011  . Iron deficiency anemia 04/01/2011  . Benign localized hyperplasia of prostate without urinary obstruction and other lower urinary tract symptoms (LUTS) 12/10/2010  . Enlarged prostate without lower urinary tract symptoms (luts) 12/10/2010  . Colon cancer screening 10/11/2010  . Hypothyroidism 10/11/2010  . Hyperlipidemia 09/20/2003  . Essential and other specified forms of tremor 01/05/2001  . Essential tremor 01/05/2001  . Acute on chronic HFrEF (heart failure with reduced ejection fraction) ([place]) 06/22/1993    Immunization History  Administered Date(s) Administered  . Influenza, High Dose Seasonal PF 12/29/2014, 12/19/2016, 12/22/2017  . Influenza, Seasonal, Injecte, Preservative Fre 01/13/2007, 12/13/2008, 01/30/2010, 12/26/2011  . Influenza,inj,Quad PF,6+ Mos 12/25/2012, 12/09/2015, 11/26/2018, 12/15/2019  . PFIZER(Purple Top)SARS-COV-2 Vaccination 05/13/2019, 06/08/2019, 12/15/2019  . PPD Test 09/25/2010  . Pneumococcal Conjugate-13 01/31/2014  . Pneumococcal Polysaccharide-23 06/20/2008, 09/02/2010    Conditions to be addressed/monitored:  Hypertension, Hyperlipidemia and Diabetes  Care Plan : PharmD - Medication Management/Assistance  Updates made by Vella Raring, [place] since 05/12/2020 12:00 AM    Problem: Disease Progression     Long-Range Goal: Disease Progression Prevented or Minimized   Start Date: 04/14/2020  Expected End Date: 07/13/2020  Recent Progress: On track  Priority: High  Note:   Current Barriers:  . Chronic Disease Management support, education, and care coordination needs related to type 2 diabetes, CKD, HFrEF s/p CardioMEMS, hyperlipidemia, atrial fibrillation, hypertension, coronary artery disease, CHF, hypothyroidism, and CML . Financial o Note patient has Partial Extra Help/LIS Subsidy o Patient enrolled in [place] [employer] health practitioner and Tr[place]) patient assistance program for 2022  calendar year o Patient denied for patient assistance for Lorenso Quarry from [employer] for 2022 calendar year - Patient on waitlist for [place] . Limited vision  Pharmacist Clinical Goal(s):  [name] Over the next 90 days, patient will verbalize ability to afford treatment regimen through collaboration with PharmD and provider.  .   Interventions: . 1:1 collaboration with [name], DO regarding development and update of comprehensive plan of care as evidenced by provider attestation and co-signature . Inter-disciplinary care team collaboration (see longitudinal plan of care)  Type 2 Diabetes . Reports patient taking: o [name] 20 units daily o  Trulicity 1.5 mg once weekly on Saturdays o Jardiance 10 mg once daily in morning . Patient reports recent AM CBGs readings   AM Blood Sugar  19 - February 153  20 - February 150  21 - February 141  22 - February 151  23 - February 131  24 - February 137  25 - February 144  Average 144    . Reports patient's appetite is improved. Reports patient is drinking Glucerna supplement daily as recommended by PCP o Note patient previously received Glucerna coupons as sent by [place] - Reports will follow up with Care Guide about whether she can receive more Glucerna coupons o Note patient receiving Meals on Wheels 5 days/week  Medication Assistance  Confirms patient currently has supply of Jardiance as picked up from [facility] (received from [facility])  Note patient was denied for [employer] patient assistance for Jardiance for 2022 calendar year based on copayment amount/household income  Heart Failure Clinic providing patient with samples of Jardiance while patient on [place] waitlist for heart failure fund.  ? Remind patient's wife to follow up with HF Clinic in [place] if needed for further samples; confirms she has phone number for  clinic.   Patient Goals/Self-Care Activities . Over the next 90 days, patient will:  . Check blood sugar, blood pressure and fluid status as directed by providers  . Take medications, with assistance of wife, as directed o Mrs. Kamel manages patient's medications using weekly pillbox as adherence tool . Attends scheduled medical appointments o Next appointment with PCP on 3/30 o Next appointment with Cardiology on 4/14  Follow Up Plan: Telephone follow up appointment with care management team member scheduled for: 4/20 at 11:15 am       Patient's preferred pharmacy is:  [facility] [place], [place] [address] Phone: [phone] Fax: [place], [place] [address] [place] [address] Phone: [phone] Fax: [facility] [place], [place] [address] Phone: [phone] Fax: [phone]  [facility] [place], [place] - [address] [place] [address] Phone: [phone] Fax: [phone]  Uses pill box? Yes   Care Plan and Follow Up Patient Decision:  Patient agrees to Care Plan and Follow-up.

## 2020-05-17 ENCOUNTER — Telehealth: Payer: Self-pay | Admitting: Internal Medicine

## 2020-05-17 ENCOUNTER — Other Ambulatory Visit: Payer: Self-pay | Admitting: Family Medicine

## 2020-05-17 ENCOUNTER — Telehealth: Payer: Self-pay

## 2020-05-17 DIAGNOSIS — E1142 Type 2 diabetes mellitus with diabetic polyneuropathy: Secondary | ICD-10-CM

## 2020-05-17 NOTE — Telephone Encounter (Signed)
I reviewed the patient lab work with his wife. The pt wife state she is concern about her husband balance issue. She said he has been unsteady for awhile but seems to be getting worse. She is also concern that his appetite has decreased.I scheduled an appt with Dr. Raliegh Ip on this Friday at 11:00am.

## 2020-05-17 NOTE — Telephone Encounter (Signed)
Copied from [place] [id number]. Topic: General - Other >> May 17, 2020 10:54 AM Mcneil, Ja-Kwan wrote: Reason for CRM: Pt wife stated pt had labs drawn about a month ago but he has not heard from anyone regarding the results. Cb# [id number]

## 2020-05-17 NOTE — Telephone Encounter (Signed)
Patient spouse calling  Would like for patient to be seen today States he feels weak and has no appetite  They also said they had a test recently but have not had any results - would like to know if we know anything Please call to discuss

## 2020-05-17 NOTE — Telephone Encounter (Signed)
Requested medication (s) are due for refill today:   There is a note that it was filled today 05/17/2020   Provider to review for refills  Requested medication (s) are on the active medication list:   Yes  Future visit scheduled:   Yes   Last ordered: 03/30/2020 #30, 2 refills  Clinic note:  Non delegated refill   Requested Prescriptions  Pending Prescriptions Disp Refills   traMADol (ULTRAM) 50 MG tablet [Pharmacy Med Name: tramadol 50 mg tablet] 30 tablet 2    Sig: TAKE ONE TO TWO TABLETS BY MOUTH AT BEDTIME AS NEEDED      Not Delegated - Analgesics:  Opioid Agonists Failed - 05/17/2020 10:29 AM      Failed - This refill cannot be delegated      Failed - Urine Drug Screen completed in last 360 days      Passed - Valid encounter within last 6 months    Recent Outpatient Visits           1 month ago Balance disorder   [place], DO   3 months ago Diabetic polyneuropathy associated with type 2 diabetes mellitus ([place])   [facility], [name], DO   3 months ago Constipation, unspecified constipation type   [place], DO   4 months ago Controlled type 2 diabetes mellitus with diabetic nephropathy, with long-term current use of insulin [facility])   [place], DO   5 months ago Controlled type 2 diabetes mellitus with diabetic nephropathy, with long-term current use of insulin ([place])   [place], [name], DO       Future Appointments             In 4 weeks [place], [name], DO [facility], [place]   In 1 month End, [name], MD [facility], [place]

## 2020-05-17 NOTE — Telephone Encounter (Signed)
Appears patient's wife has talked to PCP as well and has appointment on Friday. Attempted to call back and no answer, left message to call back.

## 2020-05-18 ENCOUNTER — Telehealth: Payer: Self-pay | Admitting: Family Medicine

## 2020-05-18 NOTE — Telephone Encounter (Signed)
   Telephone encounter was:  Successful.  05/18/2020 Name: Lucas Caldwell MRN: [id number] DOB: 05-21-1933  Lucas Caldwell is a 87 y.o. year old male who is a primary care patient of [name], DO . The community resource team was consulted for assistance with needing more glucerna coupons  Care guide performed the following interventions: [name] Lucas Caldwell called and left a message a few days ago askin gofr more glucerna coupons. I called [employer] and they mailed me a set of coupon books. I called Lucas Caldwell and let her know that we will be mailing out a whole booklet of 25 $3.00 coupons for Glucerna. .  Follow Up Plan:  No further follow up planned at this time. The patient has been provided with needed resources.  [place], Care Management Phone: [phone] Email: [email or web address]

## 2020-05-19 ENCOUNTER — Other Ambulatory Visit (HOSPITAL_COMMUNITY): Payer: Self-pay | Admitting: *Deleted

## 2020-05-19 ENCOUNTER — Ambulatory Visit (INDEPENDENT_AMBULATORY_CARE_PROVIDER_SITE_OTHER): Payer: Medicare HMO | Admitting: Family Medicine

## 2020-05-19 ENCOUNTER — Telehealth: Payer: Self-pay | Admitting: Neurology

## 2020-05-19 ENCOUNTER — Other Ambulatory Visit: Payer: Self-pay

## 2020-05-19 ENCOUNTER — Encounter: Payer: Self-pay | Admitting: Family Medicine

## 2020-05-19 ENCOUNTER — Telehealth (HOSPITAL_COMMUNITY): Payer: Self-pay | Admitting: Internal Medicine

## 2020-05-19 VITALS — BP 132/67 | HR 76 | Ht 66.0 in | Wt 202.8 lb

## 2020-05-19 DIAGNOSIS — R630 Anorexia: Secondary | ICD-10-CM | POA: Diagnosis not present

## 2020-05-19 DIAGNOSIS — J0110 Acute frontal sinusitis, unspecified: Secondary | ICD-10-CM | POA: Diagnosis not present

## 2020-05-19 DIAGNOSIS — G4733 Obstructive sleep apnea (adult) (pediatric): Secondary | ICD-10-CM

## 2020-05-19 DIAGNOSIS — N1831 Chronic kidney disease, stage 3a: Secondary | ICD-10-CM | POA: Diagnosis not present

## 2020-05-19 DIAGNOSIS — E1142 Type 2 diabetes mellitus with diabetic polyneuropathy: Secondary | ICD-10-CM | POA: Diagnosis not present

## 2020-05-19 DIAGNOSIS — R29818 Other symptoms and signs involving the nervous system: Secondary | ICD-10-CM

## 2020-05-19 DIAGNOSIS — R531 Weakness: Secondary | ICD-10-CM | POA: Diagnosis not present

## 2020-05-19 DIAGNOSIS — E876 Hypokalemia: Secondary | ICD-10-CM | POA: Diagnosis not present

## 2020-05-19 DIAGNOSIS — R2689 Other abnormalities of gait and mobility: Secondary | ICD-10-CM

## 2020-05-19 MED ORDER — TORSEMIDE 20 MG PO TABS
60.0000 mg | ORAL_TABLET | Freq: Every day | ORAL | 6 refills | Status: DC
Start: 2020-05-19 — End: 2021-02-24

## 2020-05-19 MED ORDER — AMOXICILLIN-POT CLAVULANATE 875-125 MG PO TABS: 1.0000 | ORAL_TABLET | Freq: Two times a day (BID) | ORAL | 0 refills | Status: DC

## 2020-05-19 MED ORDER — POTASSIUM CHLORIDE CRYS ER 20 MEQ PO TBCR: 20.0000 meq | EXTENDED_RELEASE_TABLET | Freq: Every day | ORAL | 1 refills | Status: DC

## 2020-05-19 NOTE — Telephone Encounter (Signed)
Patient has appointment with PCP this morning at 11 am.   No answer. Left message to call back after he sees PCP if needed to address any cardiac needs if needed.

## 2020-05-19 NOTE — Telephone Encounter (Signed)
Reviewing patients chart it looks like pts wife had been calling neurology for sleep study results not our office. Per Adline Potter patient needs to contact Dr.Traci Turner our office does not handle sleep study results.

## 2020-05-19 NOTE — Progress Notes (Signed)
Subjective:    Patient ID: Lucas Caldwell, male    DOB: May 15, 1933, 87 y.o.   MRN: [id number]  Lucas Caldwell is a 87 y.o. male presenting on 05/19/2020 for loss of balance, Dizziness, and Anorexia   HPI   Balance Problem / Dizziness Diabetes / Neuropathy  Chronic problems, he is being managed for diabetes, and A1c was controlled on last measure. [name] also followed by Dr Carles Collet [facility]) for variety of issues including essential tremors and neuropathy. He has been evaluated for balance problem in past as well. He has completed physical therapy before including cardiac rehab.  Next apt upcoming for [facility] 06/14/20 - [facility], [facility] Podiatry Dr Elvina Mattes  He did not return to [facility] PT - they were unable to reach patient due to full voicemail to schedule.  He has tried 4 prong cane but said he trips on it at times.  Sinusitis Difficulty with some chest congestion, throat clearing. Has sinus drainage in back of throat asking about antibiotic. Causes reduced appetite Weight unchanged.    Depression screen [facility] 2/9 12/22/2019 10/25/2019 09/09/2019  Decreased Interest 0 2 0  Down, Depressed, Hopeless 0 0 1  PHQ - 2 Score 0 2 1  Altered sleeping - 2 1  Tired, decreased energy - 1 1  Change in appetite - 0 2  Feeling bad or failure about yourself  - 1 0  Trouble concentrating - 0 0  Moving slowly or fidgety/restless - 0 0  Suicidal thoughts - 0 0  PHQ-9 Score - 6 5  Difficult doing work/chores - Somewhat difficult Somewhat difficult  Some recent data might be hidden    Social History   Tobacco Use  . Smoking status: Former Smoker    Packs/day: 1.75    Years: 20.00    Pack years: 35.00    Types: Cigarettes    Start date: 1955    Quit date: 1975    Years since quitting: 55.2  . Smokeless tobacco: Former Network engineer  . Vaping Use: Never used  Substance Use Topics  . Alcohol use: No    Comment: previously drank heavily - quit 1979.  . Drug use:  No    Review of Systems Per HPI unless specifically indicated above     Objective:    BP 132/67   Pulse 76   Ht 5\' 6"  (1.676 m)   Wt 202 lb 12.8 oz (92 kg)   SpO2 99%   BMI 32.73 kg/m   Wt Readings from Last 3 Encounters:  05/19/20 202 lb 12.8 oz (92 kg)  04/18/20 203 lb (92.1 kg)  04/13/20 204 lb (92.5 kg)    Physical Exam Vitals and nursing note reviewed.  Constitutional:      General: [name] not in acute distress.    Appearance: [name] well-developed. [name] not diaphoretic.     Comments: Well-appearing, comfortable, cooperative  HENT:     Head: Normocephalic and atraumatic.  Eyes:     General:        Right eye: No discharge.        Left eye: No discharge.     Conjunctiva/sclera: Conjunctivae normal.  Cardiovascular:     Rate and Rhythm: Normal rate.  Pulmonary:     Effort: Pulmonary effort is normal.  Skin:    General: Skin is warm and dry.     Findings: No erythema or rash.  Neurological:     Mental Status:  [name] alert and oriented to person, place, and time.  Psychiatric:        Behavior: Behavior normal.     Comments: Well groomed, good eye contact, normal speech and thoughts    Results for orders placed or performed in visit on 05/10/20  POCT INR  Result Value Ref Range   INR 1.8 (A) 2.0 - 3.0      Assessment & Plan:   Problem List Items Addressed This Visit    Diabetic neuropathy associated with type 2 diabetes mellitus ([place])    Other Visit Diagnoses    Balance disorder    -  Primary   Generalized weakness       Suspected sleep apnea       Appetite loss       Acute non-recurrent frontal sinusitis       Relevant Medications   amoxicillin-clavulanate (AUGMENTIN) 875-125 MG tablet      #Loss of Appetite #Sinusitis Uncertain etiology, weight has not been significantly reduced or impacted Attributes some sinus drainage to reduced appetite Reviewed course, and will offer Augmentin for sinusitis given drainage impacting coughing and swallowing  at times  #OSA ./ Sleep Apnea Had sleep study by [facility], ordered by Cardiology Asked to get copy of last result for follow-up  #HyperK CKD Last lab BMET 4.7, today will REDUCE Potassium from 109mEq daily down to 46mEq daily, take one tab only, new rx sent  #Balance Disorder / Generalized Weakness / Neuropathy T2DM Chronic problems - mostly controlled Diabetes. Discussed multifactorial cause of balance issue. No focal neuro deficit today or injury impacting his balance. He has reduced eye sight and neuropathy chronic issues as well as some muscle weakness these all contribute and he has history of essential tremors as well. - [name] using a single prong standard cane today, I advised him I would recommend a wider base cane such as a 3-4 prong cane. He can benefit from walker at times as well in future.  Last visit 03/2020 - I collaborated with patient's Neurologist Dr Carles Collet Vel[facility] Heckler) via [place] chat and reviewed this case, and ultimately we agree that he may still benefit from Physical Therapy with focus on Balance, at [facility].  No apt was scheduled due to patient voicemail full. Our office called today and we scheduled him for Neuro PT at [facility] 3/22 and 3/29 next available apt, info given to patient.  Meds ordered this encounter  Medications  . amoxicillin-clavulanate (AUGMENTIN) 875-125 MG tablet    Sig: Take 1 tablet by mouth 2 (two) times daily.    Dispense:  20 tablet    Refill:  0    Follow up plan: Return if symptoms worsen or fail to improve.   [name], [facility] 05/19/2020, 11:24 AM

## 2020-05-19 NOTE — Telephone Encounter (Signed)
Called pts wife to advise her to call Dr.Turners office for sleep study results but no answer/vm box full.

## 2020-05-19 NOTE — Telephone Encounter (Signed)
Pt wife stated she has been calling to get sleep study results, please call pt [phone], Thanks

## 2020-05-19 NOTE — Telephone Encounter (Signed)
Patient wife is calling to get the results of the sleep study test

## 2020-05-19 NOTE — Telephone Encounter (Signed)
Spoke with patients wife and advised her to contact Dr Valarie Cones office. She voiced understanding but wanted to know if I had a number for that doctor. The patient states he knows the number and the wife stated they would call.

## 2020-05-19 NOTE — Patient Instructions (Addendum)
Thank you for coming to the office today.  Physical Therapy for Balance scheduled Tues 06/06/20 at 2pm  It is located at the [facility] can do Valet parking.  -----------------  [facility] Address: [address], [place], [place] [address] Phone: [phone]  Please call them back to ask if they can send copy of your report to our office.  ------    Please schedule a Follow-up Appointment to: Return if symptoms worsen or fail to improve.  If you have any other questions or concerns, please feel free to call the office or send a message through [place]. You may also schedule an earlier appointment if necessary.  Additionally, you may be receiving a survey about your experience at our office within a few days to 1 week by e-mail or mail. We value your feedback.  [name], DO [place]

## 2020-05-19 NOTE — Telephone Encounter (Signed)
Looks like Dr. Sung Amabile ordered it and they will need to call his office

## 2020-05-22 ENCOUNTER — Telehealth: Payer: Self-pay | Admitting: Cardiology

## 2020-05-22 DIAGNOSIS — G4733 Obstructive sleep apnea (adult) (pediatric): Secondary | ICD-10-CM

## 2020-05-22 NOTE — Telephone Encounter (Signed)
° ° °  Pt's wife calling, she said they haven't heard anything from pt's sleep study since December. They would like to get the result

## 2020-05-22 NOTE — Telephone Encounter (Signed)
Spoke to wife, advised her that Dr. Radford Pax does not read sleep studies for [facility]. Advised her to reach out to Dr. Letha Cape office since he is the one who ordered the sleep study to see if he knows who could have the results.   Spoke with Gae Bon and she advised me that the pulmonologist at [facility] will read the sleep study.

## 2020-05-22 NOTE — Telephone Encounter (Signed)
Per Dr Haroldine Laws pt w/OSA, needs CPAP per Dr Radford Pax, pt's wife is aware and agreeable, referral placed to Dr Radford Pax and message sent to Sydell Axon

## 2020-05-24 ENCOUNTER — Other Ambulatory Visit: Payer: Self-pay | Admitting: Internal Medicine

## 2020-05-24 ENCOUNTER — Other Ambulatory Visit: Payer: Self-pay | Admitting: Family Medicine

## 2020-05-24 DIAGNOSIS — E1121 Type 2 diabetes mellitus with diabetic nephropathy: Secondary | ICD-10-CM

## 2020-05-24 DIAGNOSIS — Z794 Long term (current) use of insulin: Secondary | ICD-10-CM

## 2020-05-24 NOTE — Telephone Encounter (Signed)
  Notes to clinic: test strips that are been requested are no longer on medication list  Review for refill    Requested Prescriptions  Pending Prescriptions Disp Refills   ACCU-CHEK AVIVA PLUS test strip [Pharmacy Med Name: Accu-Chek Aviva Plus test strips] 200 strip 3    Sig: USE TO [place]      Endocrinology: Diabetes - Testing Supplies Passed - 05/24/2020 11:59 AM      Passed - Valid encounter within last 12 months    Recent Outpatient Visits           5 days ago Balance disorder   [place], DO   1 month ago Balance disorder   [place], DO   3 months ago Diabetic polyneuropathy associated with type 2 diabetes mellitus [facility])   [place], DO   4 months ago Constipation, unspecified constipation type   [place], DO   5 months ago Controlled type 2 diabetes mellitus with diabetic nephropathy, with long-term current use of insulin ([place])   [facility] Ranger, [name], DO       Future Appointments             In 3 weeks [place], [name], DO [facility], [place]   In 1 month End, [name], MD [facility], [place]

## 2020-05-31 ENCOUNTER — Other Ambulatory Visit: Payer: Self-pay | Admitting: Family Medicine

## 2020-05-31 ENCOUNTER — Other Ambulatory Visit: Payer: Self-pay | Admitting: Internal Medicine

## 2020-05-31 DIAGNOSIS — E1142 Type 2 diabetes mellitus with diabetic polyneuropathy: Secondary | ICD-10-CM

## 2020-06-01 NOTE — Telephone Encounter (Addendum)
Scarlette Calico, RN  Freada Bergeron, CMA Hey, he had a sleep study done at [facility] 2/9 and has OSA and needs CPAP, can you guys please arrange, I wasn't sure if I just need to place a referral to Dr Radford Pax or what was the best way to arrange, let me know thanks   Reached out to Edgar Frisk the nurse for dr End and explained the patient refuses to come to [facility] for his titration. The patient was referred by dr Haroldine Laws but tested at [facility] The ENT doctor read his sleep study and recommended a titration. Patients wife called [place] for the sleep study results but the results were not sent to [place]. I explained to [place] that dr Radford Pax does not read for [facility] but they are followed by pulmonary in [place]. Anderson Malta will try to persuade the patient to come to [facility] to test.

## 2020-06-01 NOTE — Telephone Encounter (Signed)
If he refuses to followup in [place] then he needs to be established with Pulmonary at [facility].

## 2020-06-01 NOTE — Addendum Note (Signed)
Addended by: Freada Bergeron on: 06/01/2020 12:40 PM   Modules accepted: Orders

## 2020-06-01 NOTE — Telephone Encounter (Signed)
Patient refuses to come to [place]. PER dr Radford Pax If he refuses to followup in [place] then he needs to be established with Pulmonary at [facility].

## 2020-06-02 ENCOUNTER — Telehealth: Payer: Self-pay | Admitting: Internal Medicine

## 2020-06-02 DIAGNOSIS — G4733 Obstructive sleep apnea (adult) (pediatric): Secondary | ICD-10-CM

## 2020-06-02 NOTE — Telephone Encounter (Signed)
New message:     Patient wife calling to get the result of patient test that was done last mo. Patient has not heard from anyone please call today.

## 2020-06-02 NOTE — Telephone Encounter (Signed)
Appt. Cancelled for 06-02-20. Patient declines to come to  for sleep follow up. hfe will follow up with [facility] pulmonary doctors.

## 2020-06-02 NOTE — Telephone Encounter (Signed)
No answer. Left message to call back.  I plan to follow up on what to do about the sleep study next week.

## 2020-06-02 NOTE — Telephone Encounter (Signed)
Follow up:       Patient calling to check the status of he message. She would like for a nurse to call her. I see the note was routed.

## 2020-06-02 NOTE — Telephone Encounter (Signed)
Jennifer Clarke/dr End in [place] is handling this patient case. He will follow up with pulmonary in [place] as he does not want to travel to [place] for his sleep therapy with dr Radford Pax.

## 2020-06-02 NOTE — Telephone Encounter (Signed)
See under note section in chart review note from Kevan Rosebush, RN @ 10:58 to Gershon Cull, [place]. I am going to route this call to both Gershon Cull and Kevan Rosebush, RN.

## 2020-06-05 ENCOUNTER — Telehealth: Payer: Medicare HMO | Admitting: Cardiology

## 2020-06-05 ENCOUNTER — Telehealth: Payer: Self-pay | Admitting: Internal Medicine

## 2020-06-05 NOTE — Telephone Encounter (Signed)
Called Dr Reola Mosher office to see what info I can gather on if they will do titration for the patient and manage sleep apnea supplies.

## 2020-06-05 NOTE — Telephone Encounter (Signed)
Spoke to [employer] at Dr [employer] office. She advised they do not do the titration and the sleep equipment, Dr Richardson Landry only reads the sleep studies.   Spoke with patient and wife. They are not able to drive to [facility] for the set up of the equipment. They are agreeable to referral to pulmonary to get this set up. Patient has seen Dr Mortimer Fries in the past. Referral placed and number given to wife to call to schedule appointment. She stated they will be up here tomorrow for physicial therapy and will stop by the pulmonary desk to schedule appointment.   They also requested samples of Jardiance.  Medication Samples have been left at front desk for patient to pick up.  Drug name: Reino Kent       Strength: 10 mg        Qty: 2 bottles  LOT: [id number]  Exp.Date: 02/2022

## 2020-06-05 NOTE — Telephone Encounter (Signed)
Spoke to patient's spouse, [name](DPR). [name] stated that patient recently had a sleep study and she was under the impression that patient needed an appt with our office for cpap setup.  I have reviewed sleep study and a cpap titration was recommended. Titration study was ordered by PCP on 06/01/2020. [name] stated that she was not made aware of titration study. I have made [name] aware that cpap setup would be completed through a DME company and not our office. [name] is questioning if Dr. Mortimer Fries could place order for cpap based off of sleep study or is an appointment with Dr. Mortimer Fries or NP needed prior.  Dr. Mortimer Fries, please advise. Thanks

## 2020-06-06 ENCOUNTER — Other Ambulatory Visit: Payer: Self-pay

## 2020-06-06 ENCOUNTER — Ambulatory Visit: Payer: Medicare HMO | Attending: Family Medicine

## 2020-06-06 DIAGNOSIS — R2689 Other abnormalities of gait and mobility: Secondary | ICD-10-CM | POA: Insufficient documentation

## 2020-06-06 DIAGNOSIS — M6281 Muscle weakness (generalized): Secondary | ICD-10-CM | POA: Insufficient documentation

## 2020-06-06 DIAGNOSIS — R2681 Unsteadiness on feet: Secondary | ICD-10-CM | POA: Diagnosis not present

## 2020-06-06 NOTE — Therapy (Deleted)
[place] MAIN [facility] SERVICES [place], [place], [address] Phone: [phone]   Fax:  [phone]  Physical Therapy Evaluation  Patient Details  Name: Lucas Caldwell MRN: [id number] Date of Birth: 12/20/1933 No data recorded  Encounter Date: 06/06/2020    Past Medical History:  Diagnosis Date  . Arthritis    "probably in his legs" (08/15/2016)  . Blind right eye   . BPH (benign prostatic hyperplasia)   . Breast asymmetry    Left breast is larger, present for several years.  [name] CAD (coronary artery disease)    a. Cath in the late 90's - reportedly ok;  b. 2014 s/p stenting x 2 @ [facility]; c 08/19/16 Cath/PCI with DES -> RCA, plan to treat LM 70% medically. Seen by surgery and felt to be too high risk for CABG; d. 08/2018 Cath: LM 70 (iFR 0.86), LAD 20ost, 40p, 50/67m, D1 70, LCX patent stent, OM1 30, RCA patent stent, 74m ISR, 40d, RPDA 30. PCWP 8. CO/CI 4.0/2.1; e. 03/2019 PCI to LAD (2.5x15 Resolute Onyx DES).  . Chronic combined systolic (congestive) and diastolic (congestive) heart failure ([place])    a. Previously reduced EF-->50% by echo in 2012;  b. 06/2015 Echo: EF 50-55%  c. 07/2016 Echo: EF 45-50%; d. 12/2017 Echo: EF 55%, Gr1 DD, mild MR; e. 08/2018 Echo: EF 35-40%, mildly dil PA; f. 04/2019 Echo: EF 30-35%, glob HK, nl RV fxn; g. s/p Cardiomems.  . Chronic kidney disease (CKD), stage IV (severe) ([place])   . CML (chronic myelocytic leukemia) ([place])   . GERD (gastroesophageal reflux disease)   . GIB (gastrointestinal bleeding)    a. 12/2017 3 unit PRBC GIB in setting of coumadin-->Endo/colon multiple duodenal ulcers and a single bleeding ulcer in the proximal ascending colon status post hemostatic clipping x2.  . Gout   . Hyperlipemia   . Hypertension   . Hypothyroidism   . Iron deficiency anemia   . Ischemic cardiomyopathy    a. Previously reduced EF-->50% by echo in 2012;  b. 06/2015 Echo: EF 50-55%, Gr2 DD, mild MR, mildly dil LA, Ao  sclerosis, mild TR;  c. 07/2016 Echo: EF 45-50%; d. 12/2017 Echo: EF 55%, Gr1 DD, mild MR; e. 08/2018 Echo: EF 35-40%; f. 04/2019 Echo: EF 30-35%.  . Migraine    "in the 1960s" (08/15/2016)  . PAF (paroxysmal atrial fibrillation) (H[facility])    a. ? Dx 2014-->s/p DCCV;  b. CHA2DS2VASc = 6--> Coumadin.  . Prostate cancer ([place])   . RBBB   . Type II diabetes mellitus ([place])     Past Surgical History:  Procedure Laterality Date  . CATARACT EXTRACTION W/ INTRAOCULAR LENS  IMPLANT, BILATERAL Bilateral   . COLONOSCOPY N/A 01/13/2018   Procedure: COLONOSCOPY;  Surgeon: Lin Landsman, MD;  Location: [facility] ENDOSCOPY;  Service: Gastroenterology;  Laterality: N/A;  . COLONOSCOPY WITH PROPOFOL N/A 01/01/2018   Procedure: COLONOSCOPY WITH PROPOFOL;  Surgeon: Lin Landsman, MD;  Location: [facility] ENDOSCOPY;  Service: Gastroenterology;  Laterality: N/A;  . CORONARY ANGIOPLASTY WITH STENT PLACEMENT  09/2012   2 stents  . CORONARY STENT INTERVENTION N/A 08/19/2016   Procedure: Coronary Stent Intervention;  Surgeon: [name], MD;  Location: [place] CV LAB;  Service: Cardiovascular;  Laterality: N/A;  . CORONARY STENT INTERVENTION N/A 04/13/2019   Procedure: CORONARY STENT INTERVENTION;  Surgeon: [name], MD;  Location: [place] CV LAB;  Service: Cardiovascular;  Laterality: N/A;  . ESOPHAGEAL MANOMETRY N/A 12/16/2017  Procedure: ESOPHAGEAL MANOMETRY (EM);  Surgeon: Lin Landsman, MD;  Location: [facility] ENDOSCOPY;  Service: Gastroenterology;  Laterality: N/A;  . ESOPHAGOGASTRODUODENOSCOPY N/A 12/20/2016   Procedure: ESOPHAGOGASTRODUODENOSCOPY (EGD);  Surgeon: Lin Landsman, MD;  Location: [facility] ENDOSCOPY;  Service: Gastroenterology;  Laterality: N/A;  . ESOPHAGOGASTRODUODENOSCOPY N/A 01/13/2018   Procedure: ESOPHAGOGASTRODUODENOSCOPY (EGD);  Surgeon: Lin Landsman, MD;  Location: [facility] ENDOSCOPY;  Service: Gastroenterology;  Laterality: N/A;  . ESOPHAGOGASTRODUODENOSCOPY  (EGD) WITH PROPOFOL N/A 11/03/2017   Procedure: ESOPHAGOGASTRODUODENOSCOPY (EGD) WITH PROPOFOL;  Surgeon: Lin Landsman, MD;  Location: [facility] ENDOSCOPY;  Service: Gastroenterology;  Laterality: N/A;  . EYE SURGERY    . HERNIA REPAIR     "navel"  . LEFT HEART CATH AND CORONARY ANGIOGRAPHY N/A 08/14/2016   Procedure: Left Heart Cath and Coronary Angiography;  Surgeon: [name], MD;  Location: [place] CV LAB;  Service: Cardiovascular;  Laterality: N/A;  . PRESSURE SENSOR/CARDIOMEMS N/A 09/23/2019   Procedure: PRESSURE SENSOR/CARDIOMEMS;  Surgeon: Jolaine Artist, MD;  Location: [place] CV LAB;  Service: Cardiovascular;  Laterality: N/A;  . RIGHT HEART CATH N/A 09/23/2019   Procedure: RIGHT HEART CATH;  Surgeon: Jolaine Artist, MD;  Location: [place] CV LAB;  Service: Cardiovascular;  Laterality: N/A;  . RIGHT/LEFT HEART CATH AND CORONARY ANGIOGRAPHY N/A 09/01/2018   Procedure: RIGHT/LEFT HEART CATH AND CORONARY ANGIOGRAPHY;  Surgeon: [name], MD;  Location: [place] CV LAB;  Service: Cardiovascular;  Laterality: N/A;  . RIGHT/LEFT HEART CATH AND CORONARY ANGIOGRAPHY N/A 04/13/2019   Procedure: RIGHT/LEFT HEART CATH AND CORONARY ANGIOGRAPHY;  Surgeon: [name], MD;  Location: [place] CV LAB;  Service: Cardiovascular;  Laterality: N/A;  . TOE AMPUTATION Right    "big toe"    There were no vitals filed for this visit.    Subjective Assessment - 06/06/20 1409    Subjective Pt reports he is "swimmy headed" and that he can't walking without SPC. Pt reports onset occured one year ago and that it's been getting worse since. Pt reports he feels like he's moving when he's not and that he has been having a lot "bad dreams."  Pt denies any spinning sensation. Pt reports one fall in the last 6 months. Pt spouse reports he tripped. Pt denies he sometimes has HA but says they're "not that bad." Pt reports he feels short of breath with walking and with  household tasks. Pt dr aware. Pt denies any unintentioal weight gain or loss.    Patient is accompained by: Family member    Limitations House hold activities;Lifting;Standing;Walking;Writing   Pt reports when he tries to write his hand shakes a lot   How long can you sit comfortably? not limited    How long can you stand comfortably? 5 minutes    How long can you walk comfortably? 10 minutes    Diagnostic tests none    Patient Stated Goals Feel more steady with walking    Currently in Pain? No/denies              [facility] PT Assessment - 06/06/20 1417      Assessment   Hand Dominance Right    Next MD Visit Follow up  April 30th with Dr Raliegh Ip    Prior Therapy Yes here for balance      Precautions   Precautions None      Restrictions   Weight Bearing Restrictions No      Balance Screen   Has the patient fallen in the  past 6 months Yes    How many times? 1    Has the patient had a decrease in activity level because of a fear of falling?  No    Is the patient reluctant to leave their home because of a fear of falling?  No      Home Environment   Living Environment Private residence    Living Arrangements Spouse/significant other;Children   Pt reports living with 37 yo son as well   Available Help at Discharge Family    Type of [place] to enter    Entrance Stairs-Number of Steps 3    Entrance Stairs-Rails [place] One level    [place] - single point;Walker - 2 wheels;Shower seat;Grab bars - toilet;Grab bars - tub/shower;Toilet riser      Prior Function   Level of Independence Independent    Vocation Retired    Leisure play with my two great-grandsons; play ball      Cognition   Overall Cognitive Status Within Functional Limits for tasks assessed      Standardized Balance Assessment   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 Nothing different from usual for b/b   OBJECTIVE  MUSCULOSKELETAL: Tremor: Absent Bulk: Normal Tone: Normal, no clonus  Posture Forward head posture, thoracic kyphosis  Gait   Strength R/L 4+/5 Hip flexion 4+/5 Hip abduction 4/5 Hip adduction 5/5 B Knee extension 4+/5 R, 5/5 L Knee flexion 5/5 Ankle Plantarflexion 4+/5 B Ankle Dorsiflexion   NEUROLOGICAL:  Mental Status Patient is oriented to person, place and time.  Recent memory is intact.  Remote memory is intact.  Attention span and concentration are intact.  Expressive speech is intact.  Patient's fund of knowledge is within normal limits for educational level.  Sensation Grossly intact to light touch bilateral UEs/LEs as determined by testing dermatomes C2-T2/L2-S2 respectively Proprioception and hot/cold testing deferred on this date   Coordination/Cerebellar Finger to Nose: WNL Heel to Shin: WNL Rapid alternating movements: WNL    FUNCTIONAL OUTCOME MEASURES   Results Comments  BERG 40/56 Fall risk, in need of intervention  DGI /24   FGA /30   TUG seconds   5TSTS 18 seconds B UE assist    6 Minute Walk Test    10 Meter Gait Speed 0.59 m/s Below normative values for full community ambulation  ABC Scale %   DHI /100     POSTURAL CONTROL TESTS   Modified Clinical Test of Sensory Interaction for Balance    (CTSIB):  CONDITION TIME STRATEGY SWAY  Eyes open, firm surface 30 seconds ankle   Eyes closed, firm surface 30  seconds ankle   Eyes open, foam surface 30 seconds ankle   Eyes closed, foam surface 30 seconds ankle     ASSESSMENT Clinical Impression: Pt is a pleasant year-old male/male referred for difficulty with balance. PT examination reveals deficits .  Pt presents with deficits in strength, gait and balance. Pt will benefit from skilled PT services to address deficits in balance and decrease risk for future falls.    PLAN Next Visit: HEP:    Pt will be independent with HEP in order to improve strength and balance in order to decrease fall risk and improve function at home and work.   Pt will improve BERG by at least 3 points in order to demonstrate clinically significant improvement in balance.    Pt will improve DGI by at least 3 points in order to demonstrate clinically significant improvement in balance and decreased risk for falls.  Pt will improve ABC by at least 13% in order to demonstrate clinically significant improvement in balance confidence.   Pt will decrease 5TSTS by at least 3 seconds in order to demonstrate clinically significant improvement in LE strength.  Pt will decrease TUG to below 14 seconds/decrease in order to demonstrate decreased fall risk.  Pt will decrease DHI score by at least 18 points in order to demonstrate clinically significant reduction in disability   Pt will increase 6MWT by at least 28m (133ft) in order to demonstrate clinically significant improvement in cardiopulmonary endurance and community ambulation             Objective measurements completed on examination: See above findings.                             Patient will benefit from skilled therapeutic intervention in order to improve the following deficits and impairments:     Visit Diagnosis: No diagnosis found.     Problem List Patient Active Problem List   Diagnosis Date Noted  . Urge incontinence of urine 12/22/2019  . Trifascicular block  10/20/2019  . Wound infection 10/08/2019  . Bifascicular block 05/13/2019  . Coronary stent patent 04/13/2019  . Chronic HFrEF (heart failure with reduced ejection fraction) ([place]) 03/19/2019  . PSVT (paroxysmal supraventricular tachycardia) ([place]) 03/19/2019  . 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 CKD (chronic kidney disease), stage IV ([place]) 06/04/2018  . Osteoarthritis of knees, bilateral 06/02/2018  . Hyperlipidemia associated with type 2 diabetes mellitus ([place]) 05/06/2018  . Pain of lower extremity 03/06/2018  . Duodenitis 01/30/2018  . Chronic heart failure with preserved ejection fraction (HFpEF) ([place]) 01/23/2018  . Hematochezia   . Melena   . Rectal bleeding 01/11/2018  . AVM (arteriovenous malformation) of colon without hemorrhage   . Schatzki's ring   . Esophageal dysphagia   . Diabetic retinopathy ([place]) 07/08/2017  . Ischemic cardiomyopathy 06/26/2017  . Cardiomyopathy ([place]) 06/26/2017  . Centrilobular emphysema ([place]) 06/13/2017  . Fatigue 12/18/2016  . Stable angina ([place]) 09/20/2016  . Stage 3a chronic kidney disease ([place]) 09/20/2016  . Paroxysmal atrial fibrillation ([place]) 08/28/2016  . Chronic anticoagulation 08/28/2016  . Essential hypertension 08/20/2016  . 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 heart with stable angina pectoris ([place]) 08/19/2016  . Accelerating angina ([place]) 08/15/2016  . Exertional dyspnea 08/12/2016  . 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[place]) 12/29/2015  . Gynecomastia, male 03/31/2014  . Hypertrophy of breast 03/31/2014  . Nuclear sclerosis of left eye 10/22/2013  . Enthesopathy of ankle and tarsus 04/10/2013  . Controlled type 2 diabetes mellitus with diabetic nephropathy ([place]) 04/09/2013  . Diabetic neuropathy associated with type 2 diabetes mellitus ([place]) 04/09/2013  . Cataract of left eye 11/25/2011  . Corneal opacity 09/30/2011  .  Pseudophakia of right eye 09/30/2011  . GERD (gastroesophageal reflux disease)  09/25/2011  . Gout 09/25/2011  . Hyperlipidemia LDL goal <70 09/25/2011  . Status post cataract extraction 04/11/2011  . Cataract extraction status of eye 04/11/2011  . Iron deficiency anemia 04/01/2011  . Benign localized hyperplasia of prostate without urinary obstruction and other lower urinary tract symptoms (LUTS) 12/10/2010  . Enlarged prostate without lower urinary tract symptoms (luts) 12/10/2010  . Colon cancer screening 10/11/2010  . Hypothyroidism 10/11/2010  . Hyperlipidemia 09/20/2003  . Essential and other specified forms of tremor 01/05/2001  . Essential tremor 01/05/2001  . Acute on chronic HFrEF (heart failure with reduced ejection fraction) ([place]) 06/22/1993    Zollie Pee 06/06/2020, 2:37 PM  [place] MAIN [facility] SERVICES [address] [place], [place], [address] Phone: [phone]   Fax:  [phone]  Name: Lucas Caldwell MRN: [id number] Date of Birth: 11/19/1933

## 2020-06-07 NOTE — Therapy (Signed)
[place] MAIN [facility] SERVICES [address] [place], [place], [address] Phone: [phone]   Fax:  [phone]  Physical Therapy Evaluation  Patient Details  Name: Lucas Caldwell MRN: [id number] Date of Birth: [birth year] Referring Provider (PT): [name], DO   Encounter Date: 06/06/2020   PT End of Session - 06/07/20 0808    Visit Number 1    Number of Visits 25    Date for PT Re-Evaluation 08/29/20    Authorization Time Period eval performed on 06/06/2020    PT Start Time 1405    PT Stop Time 1500    PT Time Calculation (min) 55 min    Equipment Utilized During Treatment Gait belt    Activity Tolerance Patient tolerated treatment well    Behavior During Therapy [facility] for tasks assessed/performed           Past Medical History:  Diagnosis Date  . Arthritis    "probably in his legs" (08/15/2016)  . Blind right eye   . BPH (benign prostatic hyperplasia)   . Breast asymmetry    Left breast is larger, present for several years.  [name] CAD (coronary artery disease)    a. Cath in the late 90's - reportedly ok;  b. 2014 s/p stenting x 2 @ [facility]; c 08/19/16 Cath/PCI with DES -> RCA, plan to treat LM 70% medically. Seen by surgery and felt to be too high risk for CABG; d. 08/2018 Cath: LM 70 (iFR 0.86), LAD 20ost, 40p, 50/57m, D1 70, LCX patent stent, OM1 30, RCA patent stent, 101m ISR, 40d, RPDA 30. PCWP 8. CO/CI 4.0/2.1; e. 03/2019 PCI to LAD (2.5x15 Resolute Onyx DES).  . Chronic combined systolic (congestive) and diastolic (congestive) heart failure ([place])    a. Previously reduced EF-->50% by echo in 2012;  b. 06/2015 Echo: EF 50-55%  c. 07/2016 Echo: EF 45-50%; d. 12/2017 Echo: EF 55%, Gr1 DD, mild MR; e. 08/2018 Echo: EF 35-40%, mildly dil PA; f. 04/2019 Echo: EF 30-35%, glob HK, nl RV fxn; g. s/p Cardiomems.  . Chronic kidney disease (CKD), stage IV (severe) ([place])   . CML (chronic myelocytic leukemia) ([place])   . GERD (gastroesophageal  reflux disease)   . GIB (gastrointestinal bleeding)    a. 12/2017 3 unit PRBC GIB in setting of coumadin-->Endo/colon multiple duodenal ulcers and a single bleeding ulcer in the proximal ascending colon status post hemostatic clipping x2.  . Gout   . Hyperlipemia   . Hypertension   . Hypothyroidism   . Iron deficiency anemia   . Ischemic cardiomyopathy    a. Previously reduced EF-->50% by echo in 2012;  b. 06/2015 Echo: EF 50-55%, Gr2 DD, mild MR, mildly dil LA, Ao sclerosis, mild TR;  c. 07/2016 Echo: EF 45-50%; d. 12/2017 Echo: EF 55%, Gr1 DD, mild MR; e. 08/2018 Echo: EF 35-40%; f. 04/2019 Echo: EF 30-35%.  . Migraine    "in the 1960s" (08/15/2016)  . PAF (paroxysmal atrial fibrillation) (HCC)    a. ? Dx 2014-->s/p DCCV;  b. CHA2DS2VASc = 6--> Coumadin.  . Prostate cancer ([place])   . RBBB   . Type II diabetes mellitus ([place])     Past Surgical History:  Procedure Laterality Date  . CATARACT EXTRACTION W/ INTRAOCULAR LENS  IMPLANT, BILATERAL Bilateral   . COLONOSCOPY N/A 01/13/2018   Procedure: COLONOSCOPY;  Surgeon: Lin Landsman, MD;  Location: [facility] ENDOSCOPY;  Service: Gastroenterology;  Laterality: N/A;  . COLONOSCOPY WITH PROPOFOL N/A 01/01/2018  Procedure: COLONOSCOPY WITH PROPOFOL;  Surgeon: Lin Landsman, MD;  Location: [facility] ENDOSCOPY;  Service: Gastroenterology;  Laterality: N/A;  . CORONARY ANGIOPLASTY WITH STENT PLACEMENT  09/2012   2 stents  . CORONARY STENT INTERVENTION N/A 08/19/2016   Procedure: Coronary Stent Intervention;  Surgeon: [name], MD;  Location: [place] CV LAB;  Service: Cardiovascular;  Laterality: N/A;  . CORONARY STENT INTERVENTION N/A 04/13/2019   Procedure: CORONARY STENT INTERVENTION;  Surgeon: [name], MD;  Location: [place] CV LAB;  Service: Cardiovascular;  Laterality: N/A;  . ESOPHAGEAL MANOMETRY N/A 12/16/2017   Procedure: ESOPHAGEAL MANOMETRY (EM);  Surgeon: Lin Landsman, MD;  Location: [facility] ENDOSCOPY;   Service: Gastroenterology;  Laterality: N/A;  . ESOPHAGOGASTRODUODENOSCOPY N/A 12/20/2016   Procedure: ESOPHAGOGASTRODUODENOSCOPY (EGD);  Surgeon: Lin Landsman, MD;  Location: [facility] ENDOSCOPY;  Service: Gastroenterology;  Laterality: N/A;  . ESOPHAGOGASTRODUODENOSCOPY N/A 01/13/2018   Procedure: ESOPHAGOGASTRODUODENOSCOPY (EGD);  Surgeon: Lin Landsman, MD;  Location: [facility] ENDOSCOPY;  Service: Gastroenterology;  Laterality: N/A;  . ESOPHAGOGASTRODUODENOSCOPY (EGD) WITH PROPOFOL N/A 11/03/2017   Procedure: ESOPHAGOGASTRODUODENOSCOPY (EGD) WITH PROPOFOL;  Surgeon: Lin Landsman, MD;  Location: [facility] ENDOSCOPY;  Service: Gastroenterology;  Laterality: N/A;  . EYE SURGERY    . HERNIA REPAIR     "navel"  . LEFT HEART CATH AND CORONARY ANGIOGRAPHY N/A 08/14/2016   Procedure: Left Heart Cath and Coronary Angiography;  Surgeon: [name], MD;  Location: [place] CV LAB;  Service: Cardiovascular;  Laterality: N/A;  . PRESSURE SENSOR/CARDIOMEMS N/A 09/23/2019   Procedure: PRESSURE SENSOR/CARDIOMEMS;  Surgeon: Jolaine Artist, MD;  Location: [place] CV LAB;  Service: Cardiovascular;  Laterality: N/A;  . RIGHT HEART CATH N/A 09/23/2019   Procedure: RIGHT HEART CATH;  Surgeon: Jolaine Artist, MD;  Location: [place] CV LAB;  Service: Cardiovascular;  Laterality: N/A;  . RIGHT/LEFT HEART CATH AND CORONARY ANGIOGRAPHY N/A 09/01/2018   Procedure: RIGHT/LEFT HEART CATH AND CORONARY ANGIOGRAPHY;  Surgeon: [name], MD;  Location: [place] CV LAB;  Service: Cardiovascular;  Laterality: N/A;  . RIGHT/LEFT HEART CATH AND CORONARY ANGIOGRAPHY N/A 04/13/2019   Procedure: RIGHT/LEFT HEART CATH AND CORONARY ANGIOGRAPHY;  Surgeon: [name], MD;  Location: [place] CV LAB;  Service: Cardiovascular;  Laterality: N/A;  . TOE AMPUTATION Right    "big toe"    There were no vitals filed for this visit.    Subjective Assessment - 06/06/20 1409    Subjective Pt  presents to therapy with spouse. He reports no pain today. Pt has follow-up with his doctor in April.    Patient is accompained by: Family member    Pertinent History Pt is an 87 y/o male presenting to physical therapy today with spouse with reports of difficulty with balance, dizziness, and walking. Pt states he started feeling "swimmy headed" approximately one year ago and that it is affecting his balance with walking and has been progressively worsening. Pt reports he feels he is moving when he's still. He denies spinning sensation. He reports he sometimes has headache but says "they are not that bad." Pt reports feeling SOB with walking and household tasks (his doctor is aware). Pt denies unintentional weight gain or loss. Pt reports 1 fall the last 6 months. Per spouse pt tripped at time of fall and do not provide further details. Pt with extensive PMH. Relevant PMH per chart: Exertional dyspnea, centrilobular emphysema, angina, CAD, acute on chronic HFrEF, controlled DMII with diabetic nephropathy, diabetic neuropathy,  gout, CKD, essential  HTN, paroxysmal atrial fibrillation, chronic anticoagulation, CKD, fatigue, iron deficiency anemia, pain of LE, OA of B knees, hx of partial ray amputation of R great toe, s/p cataract extraction status of eye, enthesopathy of ankle and tarsus, GERD, hyperlipidemia, hypothyroidism, nuclear sclerosis of L eye, psedophakia of R eye, ischemic cardiomyopathy, esophageal dysphagia, chronic heart failure with preserved ejection fraction, PSVT, coronary stent patent, bifascicular block, trifascicular block, wound infection, urge incontinence of urine.    Limitations House hold activities;Lifting;Standing;Walking;Writing   Pt reports when he tries to write his hand shakes a lot   How long can you sit comfortably? not limited    How long can you stand comfortably? 5 minutes    How long can you walk comfortably? 10 minutes    Diagnostic tests DG Chest 2 View  from 07/06/2019  per chart: "  IMPRESSION:  No acute abnormalities."    Patient Stated Goals Feel more steady with walking    Currently in Pain? No/denies              [facility] PT Assessment - 06/06/20 1417      Assessment   Medical Diagnosis Balance disorder, generalized weakness, Diabetic Polyneuropathy associated with DMII    Referring Provider (PT) [name], DO    Onset Date/Surgical Date 03/19/19   pt reports onset approx 1 year ago   Hand Dominance Right    Next MD Visit Follow up  April 30th with Dr Raliegh Ip    Prior Therapy Yes here for balance      Precautions   Precautions None      Restrictions   Weight Bearing Restrictions No      Balance Screen   Has the patient fallen in the past 6 months Yes    How many times? 1    Has the patient had a decrease in activity level because of a fear of falling?  No    Is the patient reluctant to leave their home because of a fear of falling?  No      Home Environment   Living Environment Private residence    Living Arrangements Spouse/significant other;Children   Pt reports living with 36 yo son as well   Available Help at Discharge Family    Type of [place] to enter    Entrance Stairs-Number of Steps 3    Entrance Stairs-[place] One level    [place] - single point;Walker - 2 wheels;Shower seat;Grab bars - toilet;Grab bars - tub/shower;Toilet riser      Prior Function   Level of Independence Independent    Vocation Retired    Leisure play with my two great-grandsons; play ball      Cognition   Overall Cognitive Status Within Functional Limits for tasks assessed      Standardized Balance Assessment   Standardized Balance Assessment Berg Balance Test      Berg Balance Test   Sit to Stand Able to stand without using hands and stabilize independently    Standing Unsupported Able to stand safely 2 minutes    Sitting with Back Unsupported but Feet Supported on Floor or  Stool Able to sit safely and securely 2 minutes    Stand to Sit Sits safely with minimal use of hands    Transfers Able to transfer safely, definite need of hands    Standing Unsupported with Eyes Closed Unable to keep eyes closed 3 seconds but  stays steady    Standing Unsupported with Feet Together Able to place feet together independently and stand for 1 minute with supervision    From Standing, Reach Forward with Outstretched Arm Can reach forward >12 cm safely (5")    From Standing Position, Pick up Object from [place] to pick up shoe, needs supervision    From Standing Position, Turn to Look Behind Over each Shoulder Looks behind from both sides and weight shifts well    Turn 360 Degrees Able to turn 360 degrees safely but slowly    Standing Unsupported, Alternately Place Feet on Step/Stool Able to stand independently and complete 8 steps >20 seconds    Standing Unsupported, One Foot in [place] to take small step independently and hold 30 seconds    Standing on One Leg Unable to try or needs assist to prevent fall    Total Score 40            SUBJECTIVE  Chief complaint: imbalance Onset: approx 1 year ago Recent changes in overall health/medication: No Prior history of physical therapy for balance: yes Follow-up appointment with MD: in April  Red flags (bowel/bladder changes, saddle paresthesia, personal history of cancer, chills/fever, night sweats, unrelenting pain) Negative, however, pt does report some sweating at night where his clothing gets "damp," pt instructed on signs to look for for increased sweating, but instructed to still report this to his physician.   Pt report N/T in B feet and B hands   OBJECTIVE  MUSCULOSKELETAL: Bulk: Normal Tone: Normal, no clonus  Posture Forward head posture, thoracic kyphosis, shoulder IR at rest B  Gait Pt ambulates with SPC, decreased gait speed (see 10MWT), decreased step-length, poor overall  mobility   Strength R/L 4+/5 B Hip flexion 4+/5 B Hip abduction 4/5 B Hip adduction 5/5 B Knee extension 4+/5 R, 5/5 L Knee flexion 5/5 B Ankle Plantarflexion 4+/5 B Ankle Dorsiflexion  NEUROLOGICAL:  Sensation Deferred future session  Coordination/Cerebellar Finger to Nose: WNL Rapid alternating movements: WNL  FUNCTIONAL OUTCOME MEASURES FOTO: 46% 10MWT: 0.59 m/s with SPC; impaired, below speed necessary for community ambulation Berg: 40/56; impaired, indicates increased risk for falls 5xSTS: 18 sec; impaired, indicates decreased BLE power and increased fall risk DHI: 48%; impaired, indicates perceived moderate handicap  ASSESSMENT Pt is a pleasant 87 year-old male referred for a combination of impairments in balance, strength, dizziness, and overall debility. PT examination reveals gross BLE strength/power deficit (MMT, 5xSTS 18 sec), impaired balance (Berg 40/56), and impaired gait speed (10MWT 0.59 m/s), mechanics and ability. Pt DHI score of 48% also indicates moderate perceived handicap related to dizziness. Pt FOTO score of 46% indicates decreased functional mobility. These aforementioned findings are impacting pt's community participation, ADLs, and place the patient at an increased risk for falls, where pt has a history of a fall in the last 6 months. Pt will benefit from skilled PT services to address these deficits in order to decrease fall risk, and improve safety and ease with ADLs and community participation to improve QOL.   PLAN Next Visit: initiate balance exercises, assess TUG, FGA HEP: to be issued next session  Objective measurements completed on examination: See above findings.      PT Education - 06/07/20 0807    Education Details Pt and spouse (present at appointment) educated on exam findings, indications for therapy and POC    Person(s) Educated Patient;Spouse    Methods Explanation    Comprehension Verbalized understanding  PT  Short Term Goals - 06/07/20 2035      PT SHORT TERM GOAL #1   Title Patient will be independent in home exercise program to improve strength/mobility for better functional independence with ADLs.    Baseline 06/06/2020: HEP to be initiated next visit    Time 6    Period Weeks    Status New    Target Date 07/18/20             PT Long Term Goals - 06/07/20 0813      PT LONG TERM GOAL #1   Title Patient will increase FOTO score to equal to or greater than 54 to demonstrate statistically significant improvement in mobility and quality of life.    Baseline 06/06/2020: 46 (risk-adjusted 45)    Time 12    Period Weeks    Status New    Target Date 08/29/20      PT LONG TERM GOAL #2   Title Patient will increase 10 meter walk test to >1.17m/s as to improve gait speed for better community ambulation and to reduce fall risk.    Baseline 06/06/2020: 0.59 m/s with SPC    Time 12    Period Weeks    Status New    Target Date 08/29/20      PT LONG TERM GOAL #3   Title Patient will increase Berg Balance score by > 6 points to demonstrate decreased fall risk during functional activities.    Baseline 06/06/2020: 40/56    Time 12    Period Weeks    Status New    Target Date 08/29/20      PT LONG TERM GOAL #4   Title Patient (> 32 years old) will complete five times sit to stand test in < 15 seconds indicating an increased LE strength and improved balance.    Baseline 06/06/2020: 18 sec performed with BUE assist off chair    Time 12    Period Weeks    Status New    Target Date 08/29/20      PT LONG TERM GOAL #5   Title Patient will reduce dizziness handicap inventory score by at least 18 points, for less dizziness with ADLs and increased safety with home and work tasks.    Baseline 06/06/2020: 48% (moderate handicap)    Time 12    Period Weeks    Status New    Target Date 08/29/20                  Plan - 06/07/20 5974    Clinical Impression Statement Pt is a pleasant 87  year-old male referred for a combination of impairments in balance, strength, dizziness, and overall debility. PT examination reveals gross BLE strength/power deficit (MMT, 5xSTS 18 sec), impaired balance (Berg 40/56), and impaired gait speed (10MWT 0.59 m/s), mechanics and ability. Pt DHI score of 48% also indicates moderate perceived handicap related to dizziness. Pt FOTO score of 46% indicates decreased functional mobility. These aforementioned findings are impacting pt's community participation, ADLs, and place the patient at an increased risk for falls, where pt has a history of a fall in the last 6 months. Pt will benefit from skilled PT services to address these deficits in order to decrease fall risk, and improve safety and ease with ADLs and community participation to improve QOL.    Personal Factors and Comorbidities Age;Comorbidity 3+;Fitness;Comorbidity 1;Comorbidity 2;Past/Current Experience;Time since onset of injury/illness/exacerbation    Comorbidities Pertinent PMH/comorbidities per chart: Exertional dyspnea,  centrilobular emphysema, angina, CAD, acute on chronic HFrEF, controlled DMII with diabetic nephropathy, diabetic neuropathy, gout, CKD, essential HTN, paroxysmal atrial fibrillation, chronic anticoagulation, CKD, fatigue, iron deficiency anemia, pain of LE, OA of B knees, hx of partial ray amputation of R great toe, s/p cataract extraction status of eye, enthesopathy of ankle and tarsus, GERD, hyperlipidemia, hypothyroidism, nuclear sclerosis of L eye, psedophakia of R eye, ischemic cardiomyopathy, esophageal dysphagia, chronic heart failure with preserved ejection fraction, PSVT, coronary stent patent, bifascicular block, trifascicular block, wound infection, urge incontinence of urine.    Examination-Activity Limitations Bathing;Bed Mobility;Bend;Caring for Others;Carry;Continence;Dressing;Hygiene/Grooming;Stairs;Squat;Reach Overhead;Locomotion Level;Lift;Stand;Toileting;Transfers     Examination-Participation Restrictions Medication Management;Cleaning;Meal Prep;Community Activity;Driving;Laundry;Shop;Yard Work;Volunteer    Stability/Clinical Decision Making Evolving/Moderate complexity    Clinical Decision Making Moderate    Rehab Potential Good    PT Frequency 2x / week    PT Duration 12 weeks    PT Treatment/Interventions ADLs/Self Care Home Management;Biofeedback;Canalith Repostioning;Cryotherapy;Electrical Stimulation;Moist Heat;Traction;Ultrasound;DME Instruction;Gait training;Stair training;Functional mobility training;Therapeutic activities;Therapeutic exercise;Balance training;Neuromuscular re-education;Patient/family education;Orthotic Fit/Training;Manual techniques;Wheelchair mobility training;Passive range of motion;Dry needling;Energy conservation;Taping;Vestibular;Joint Manipulations;Spinal Manipulations;Aquatic Therapy    PT Next Visit Plan initiate balance exercises, TUG, FGA    PT Home Exercise Plan to be initiated next session    Consulted and Agree with Plan of Care Patient;Family member/caregiver    Family Member Consulted spouse           Patient will benefit from skilled therapeutic intervention in order to improve the following deficits and impairments:  Abnormal gait,Dizziness,Increased fascial restricitons,Impaired sensation,Improper body mechanics,Pain,Cardiopulmonary status limiting activity,Decreased coordination,Decreased mobility,Postural dysfunction,Decreased activity tolerance,Decreased endurance,Decreased range of motion,Hypomobility,Decreased strength,Decreased balance,Difficulty walking,Impaired flexibility  Visit Diagnosis: Unsteadiness on feet  Other abnormalities of gait and mobility  Muscle weakness (generalized)     Problem List Patient Active Problem List   Diagnosis Date Noted  . Urge incontinence of urine 12/22/2019  . Trifascicular block 10/20/2019  . Wound infection 10/08/2019  . Bifascicular block 05/13/2019  .  Coronary stent patent 04/13/2019  . Chronic HFrEF (heart failure with reduced ejection fraction) ([place]) 03/19/2019  . PSVT (paroxysmal supraventricular tachycardia) ([place]) 03/19/2019  . 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 ([place]) 08/24/2018  . CKD (chronic kidney disease), stage IV ([place]) 06/04/2018  . Osteoarthritis of knees, bilateral 06/02/2018  . Hyperlipidemia associated with type 2 diabetes mellitus ([place]) 05/06/2018  . Pain of lower extremity 03/06/2018  . Duodenitis 01/30/2018  . Chronic heart failure with preserved ejection fraction (HFpEF) ([place]) 01/23/2018  . Hematochezia   . Melena   . Rectal bleeding 01/11/2018  . AVM (arteriovenous malformation) of colon without hemorrhage   . Schatzki's ring   . Esophageal dysphagia   . Diabetic retinopathy ([place]) 07/08/2017  . Ischemic cardiomyopathy 06/26/2017  . Cardiomyopathy ([place]) 06/26/2017  . Centrilobular emphysema ([place]) 06/13/2017  . Fatigue 12/18/2016  . Stable angina ([place]) 09/20/2016  . Stage 3a chronic kidney disease ([place]) 09/20/2016  . Paroxysmal atrial fibrillation ([place]) 08/28/2016  . Chronic anticoagulation 08/28/2016  . Essential hypertension 08/20/2016  . 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 heart with stable angina pectoris ([place]) 08/19/2016  . Accelerating angina ([place]) 08/15/2016  . Exertional dyspnea 08/12/2016  . 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[place]) 12/29/2015  . Gynecomastia, male 03/31/2014  . Hypertrophy of breast 03/31/2014  . Nuclear sclerosis of left eye 10/22/2013  . Enthesopathy of ankle and tarsus 04/10/2013  . Controlled type 2 diabetes mellitus with diabetic nephropathy ([place]) 04/09/2013  . Diabetic neuropathy associated with type 2 diabetes mellitus ([place]) 04/09/2013  .  Cataract of left eye 11/25/2011  . Corneal opacity 09/30/2011  . Pseudophakia of right eye 09/30/2011  . GERD (gastroesophageal reflux disease) 09/25/2011  . Gout 09/25/2011  . Hyperlipidemia LDL goal <70 09/25/2011  .  Status post cataract extraction 04/11/2011  . Cataract extraction status of eye 04/11/2011  . Iron deficiency anemia 04/01/2011  . Benign localized hyperplasia of prostate without urinary obstruction and other lower urinary tract symptoms (LUTS) 12/10/2010  . Enlarged prostate without lower urinary tract symptoms (luts) 12/10/2010  . Colon cancer screening 10/11/2010  . Hypothyroidism 10/11/2010  . Hyperlipidemia 09/20/2003  . Essential and other specified forms of tremor 01/05/2001  . Essential tremor 01/05/2001  . Acute on chronic HFrEF (heart failure with reduced ejection fraction) ([place]) 06/22/1993   Ricard Dillon PT, DPT 06/07/2020, 10:14 AM  [place] MAIN [facility] SERVICES [address] [place], [place], [address] Phone: [phone]   Fax:  [phone]  Name: Lucas Caldwell MRN: [id number] Date of Birth: 1933-12-22

## 2020-06-07 NOTE — Telephone Encounter (Signed)
Patient has appointment with Pulmonology on 07/12/20.

## 2020-06-12 ENCOUNTER — Telehealth: Payer: Self-pay | Admitting: Family Medicine

## 2020-06-12 DIAGNOSIS — M2041 Other hammer toe(s) (acquired), right foot: Secondary | ICD-10-CM | POA: Diagnosis not present

## 2020-06-12 DIAGNOSIS — M2042 Other hammer toe(s) (acquired), left foot: Secondary | ICD-10-CM | POA: Diagnosis not present

## 2020-06-12 DIAGNOSIS — Z89411 Acquired absence of right great toe: Secondary | ICD-10-CM | POA: Diagnosis not present

## 2020-06-12 DIAGNOSIS — M216X2 Other acquired deformities of left foot: Secondary | ICD-10-CM | POA: Diagnosis not present

## 2020-06-12 DIAGNOSIS — E1142 Type 2 diabetes mellitus with diabetic polyneuropathy: Secondary | ICD-10-CM | POA: Diagnosis not present

## 2020-06-12 DIAGNOSIS — L97512 Non-pressure chronic ulcer of other part of right foot with fat layer exposed: Secondary | ICD-10-CM | POA: Diagnosis not present

## 2020-06-12 DIAGNOSIS — B351 Tinea unguium: Secondary | ICD-10-CM | POA: Diagnosis not present

## 2020-06-12 DIAGNOSIS — M216X1 Other acquired deformities of right foot: Secondary | ICD-10-CM | POA: Diagnosis not present

## 2020-06-12 DIAGNOSIS — M792 Neuralgia and neuritis, unspecified: Secondary | ICD-10-CM | POA: Diagnosis not present

## 2020-06-12 NOTE — Telephone Encounter (Signed)
Patient's spouse, [name](DPR) is aware of below message. She voiced her understanding and had no further questions.  Nothing further needed.

## 2020-06-12 NOTE — Telephone Encounter (Signed)
Lets wait for CPAP titration study and can order settings accordingly without OV

## 2020-06-12 NOTE — Telephone Encounter (Signed)
He has not had cpap titration yet.

## 2020-06-12 NOTE — Telephone Encounter (Signed)
Copied from [place] [id number]. Topic: Medicare AWV >> Jun 12, 2020 10:35 AM Cher Nakai R wrote: Reason for CRM:   Left message for patient to call back and schedule the Medicare Annual Wellness Visit (AWV) virtually or by telephone.  Last AWV 11/24/2018  Please schedule at anytime with [place].  40 minute appointment  Any questions, please call me at [phone]

## 2020-06-12 NOTE — Telephone Encounter (Signed)
Has patient had CPAP titration study?

## 2020-06-13 ENCOUNTER — Other Ambulatory Visit: Payer: Self-pay

## 2020-06-13 ENCOUNTER — Ambulatory Visit: Payer: Medicare HMO

## 2020-06-13 DIAGNOSIS — M6281 Muscle weakness (generalized): Secondary | ICD-10-CM | POA: Diagnosis not present

## 2020-06-13 DIAGNOSIS — R2681 Unsteadiness on feet: Secondary | ICD-10-CM | POA: Diagnosis not present

## 2020-06-13 DIAGNOSIS — R2689 Other abnormalities of gait and mobility: Secondary | ICD-10-CM

## 2020-06-13 NOTE — Therapy (Signed)
[place] [facility] [address] [place], [place], [address] Phone: [phone]   Fax:  [phone]  Physical Therapy Treatment  Patient Details  Name: Lucas Caldwell MRN: [id number] Date of Birth: 1933-12-28 Referring Provider (PT): [name], DO   Encounter Date: 06/13/2020   PT End of Session - 06/13/20 1709    Visit Number 2    Number of Visits 25    Date for PT Re-Evaluation 08/29/20    Authorization Time Period eval performed on 06/06/2020    PT Start Time 1435    PT Stop Time 1515    PT Time Calculation (min) 40 min    Equipment Utilized During Treatment Gait belt    Activity Tolerance Patient tolerated treatment well    Behavior During Therapy [facility] for tasks assessed/performed           Past Medical History:  Diagnosis Date  . Arthritis    "probably in his legs" (08/15/2016)  . Blind right eye   . BPH (benign prostatic hyperplasia)   . Breast asymmetry    Left breast is larger, present for several years.  [name] CAD (coronary artery disease)    a. Cath in the late 90's - reportedly ok;  b. 2014 s/p stenting x 2 @ [facility]; c 08/19/16 Cath/PCI with DES -> RCA, plan to treat LM 70% medically. Seen by surgery and felt to be too high risk for CABG; d. 08/2018 Cath: LM 70 (iFR 0.86), LAD 20ost, 40p, 50/55m, D1 70, LCX patent stent, OM1 30, RCA patent stent, 45m ISR, 40d, RPDA 30. PCWP 8. CO/CI 4.0/2.1; e. 03/2019 PCI to LAD (2.5x15 Resolute Onyx DES).  . Chronic combined systolic (congestive) and diastolic (congestive) heart failure ([place])    a. Previously reduced EF-->50% by echo in 2012;  b. 06/2015 Echo: EF 50-55%  c. 07/2016 Echo: EF 45-50%; d. 12/2017 Echo: EF 55%, Gr1 DD, mild MR; e. 08/2018 Echo: EF 35-40%, mildly dil PA; f. 04/2019 Echo: EF 30-35%, glob HK, nl RV fxn; g. s/p Cardiomems.  . Chronic kidney disease (CKD), stage IV (severe) ([place])   . CML (chronic myelocytic leukemia) ([place])   . GERD (gastroesophageal reflux  disease)   . GIB (gastrointestinal bleeding)    a. 12/2017 3 unit PRBC GIB in setting of coumadin-->Endo/colon multiple duodenal ulcers and a single bleeding ulcer in the proximal ascending colon status post hemostatic clipping x2.  . Gout   . Hyperlipemia   . Hypertension   . Hypothyroidism   . Iron deficiency anemia   . Ischemic cardiomyopathy    a. Previously reduced EF-->50% by echo in 2012;  b. 06/2015 Echo: EF 50-55%, Gr2 DD, mild MR, mildly dil LA, Ao sclerosis, mild TR;  c. 07/2016 Echo: EF 45-50%; d. 12/2017 Echo: EF 55%, Gr1 DD, mild MR; e. 08/2018 Echo: EF 35-40%; f. 04/2019 Echo: EF 30-35%.  . Migraine    "in the 1960s" (08/15/2016)  . PAF (paroxysmal atrial fibrillation) (HCC)    a. ? Dx 2014-->s/p DCCV;  b. CHA2DS2VASc = 6--> Coumadin.  . Prostate cancer ([place])   . RBBB   . Type II diabetes mellitus ([place])     Past Surgical History:  Procedure Laterality Date  . CATARACT EXTRACTION W/ INTRAOCULAR LENS  IMPLANT, BILATERAL Bilateral   . COLONOSCOPY N/A 01/13/2018   Procedure: COLONOSCOPY;  Surgeon: Lin Landsman, MD;  Location: [facility] ENDOSCOPY;  Service: Gastroenterology;  Laterality: N/A;  . COLONOSCOPY WITH PROPOFOL N/A 01/01/2018  Procedure: COLONOSCOPY WITH PROPOFOL;  Surgeon: Lin Landsman, MD;  Location: [facility] ENDOSCOPY;  Service: Gastroenterology;  Laterality: N/A;  . CORONARY ANGIOPLASTY WITH STENT PLACEMENT  09/2012   2 stents  . CORONARY STENT INTERVENTION N/A 08/19/2016   Procedure: Coronary Stent Intervention;  Surgeon: [name], MD;  Location: [place] CV LAB;  Service: Cardiovascular;  Laterality: N/A;  . CORONARY STENT INTERVENTION N/A 04/13/2019   Procedure: CORONARY STENT INTERVENTION;  Surgeon: [name], MD;  Location: [place] CV LAB;  Service: Cardiovascular;  Laterality: N/A;  . ESOPHAGEAL MANOMETRY N/A 12/16/2017   Procedure: ESOPHAGEAL MANOMETRY (EM);  Surgeon: Lin Landsman, MD;  Location: [facility] ENDOSCOPY;  Service:  Gastroenterology;  Laterality: N/A;  . ESOPHAGOGASTRODUODENOSCOPY N/A 12/20/2016   Procedure: ESOPHAGOGASTRODUODENOSCOPY (EGD);  Surgeon: Lin Landsman, MD;  Location: [facility] ENDOSCOPY;  Service: Gastroenterology;  Laterality: N/A;  . ESOPHAGOGASTRODUODENOSCOPY N/A 01/13/2018   Procedure: ESOPHAGOGASTRODUODENOSCOPY (EGD);  Surgeon: Lin Landsman, MD;  Location: [facility] ENDOSCOPY;  Service: Gastroenterology;  Laterality: N/A;  . ESOPHAGOGASTRODUODENOSCOPY (EGD) WITH PROPOFOL N/A 11/03/2017   Procedure: ESOPHAGOGASTRODUODENOSCOPY (EGD) WITH PROPOFOL;  Surgeon: Lin Landsman, MD;  Location: [facility] ENDOSCOPY;  Service: Gastroenterology;  Laterality: N/A;  . EYE SURGERY    . HERNIA REPAIR     "navel"  . LEFT HEART CATH AND CORONARY ANGIOGRAPHY N/A 08/14/2016   Procedure: Left Heart Cath and Coronary Angiography;  Surgeon: [name], MD;  Location: [place] CV LAB;  Service: Cardiovascular;  Laterality: N/A;  . PRESSURE SENSOR/CARDIOMEMS N/A 09/23/2019   Procedure: PRESSURE SENSOR/CARDIOMEMS;  Surgeon: Jolaine Artist, MD;  Location: [place] CV LAB;  Service: Cardiovascular;  Laterality: N/A;  . RIGHT HEART CATH N/A 09/23/2019   Procedure: RIGHT HEART CATH;  Surgeon: Jolaine Artist, MD;  Location: [place] CV LAB;  Service: Cardiovascular;  Laterality: N/A;  . RIGHT/LEFT HEART CATH AND CORONARY ANGIOGRAPHY N/A 09/01/2018   Procedure: RIGHT/LEFT HEART CATH AND CORONARY ANGIOGRAPHY;  Surgeon: [name], MD;  Location: [place] CV LAB;  Service: Cardiovascular;  Laterality: N/A;  . RIGHT/LEFT HEART CATH AND CORONARY ANGIOGRAPHY N/A 04/13/2019   Procedure: RIGHT/LEFT HEART CATH AND CORONARY ANGIOGRAPHY;  Surgeon: [name], MD;  Location: [place] CV LAB;  Service: Cardiovascular;  Laterality: N/A;  . TOE AMPUTATION Right    "big toe"    There were no vitals filed for this visit.   Subjective Assessment - 06/13/20 1436    Subjective Pt denies  pain today. Pt saw his doctor yesterday about his R foot where they placed cushion under R big toe and wrapped it up. Pt spouse says per dr he has no restrictions with weightbearing.  Pt reports no falls or near falls. Pt states he has been walking some since his doctor's appointment without issue.    Patient is accompained by: Family member    Pertinent History Pt is an 87 y/o male presenting to physical therapy today with spouse with reports of difficulty with balance, dizziness, and walking. Pt states he started feeling "swimmy headed" approximately one year ago and that it is affecting his balance with walking and has been progressively worsening. Pt reports he feels he is moving when he's still. He denies spinning sensation. He reports he sometimes has headache but says "they are not that bad." Pt reports feeling SOB with walking and household tasks (his doctor is aware). Pt denies unintentional weight gain or loss. Pt reports 1 fall the last 6 months. Per spouse pt tripped at time  of fall and do not provide further details. Pt with extensive PMH. 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    Limitations House hold activities;Lifting;Standing;Walking;Writing   Pt reports when he tries to write his hand shakes a lot   How  long can you sit comfortably? not limited    How long can you stand comfortably? 5 minutes    How long can you walk comfortably? 10 minutes    Diagnostic tests DG Chest 2 View  from 07/06/2019 per chart: "  IMPRESSION:  No acute abnormalities."    Patient Stated Goals Feel more steady with walking    Currently in Pain? No/denies            TREATMENT  Neuro: FGA: 15/30 performed with SPC, CGA. Greatest difficulty with gait with EC, stepping over an obstacle, gait and pivot turn, and change in gait speed.  Therex: demo, VC/TC provided for all therex  Seated marches: 1x12 with no AW, 1x12 with 2.5# AW BLEs, followed with demo for performing with RTB (issued to pt) at home Hip abduction with RTB - 2x12 BLEs; pt rates exercise "medium" difficulty Seated knee extension with RTB 2x12 B LEs Seated knee flexion/hamstring curls with RTB 2x12 BLEs STS 1x10, CGA; pt rates difficult    Access Code: [email or web address] URL: [email or web address] Date: 06/13/2020 Prepared by: Ricard Dillon  Exercises Seated March - 1 x daily - 4 x weekly - 3 sets - 10 reps Seated Hip Abduction with Resistance - 1 x daily - 4 x weekly - 3 sets - 10 reps Sitting Knee Extension with Resistance - 1 x daily - 4 x weekly - 3 sets - 10 reps Seated Hamstring Curls with Resistance - 1 x daily - 4 x weekly - 3 sets - 10 reps Sit to Stand with Counter Support - 1 x daily - 4 x weekly - 3 sets - 10 reps    Education provided throughout in the form of demo, VC/TC to facilitate movement at target joints and correct muscle activation with exercises.    PT Education - 06/13/20 1708    Education Details Issued HEP with handout (see note)    Person(s) Educated Patient    Methods Explanation;Demonstration;Verbal cues;Tactile cues;Handout    Comprehension Verbalized understanding;Returned demonstration            PT Short Term Goals - 06/07/20 8413      PT SHORT TERM GOAL #1   Title Patient will be independent in  home exercise program to improve strength/mobility for better functional independence with ADLs.    Baseline 06/06/2020: HEP to be initiated next visit    Time 6    Period Weeks    Status New    Target Date 07/18/20             PT Long Term Goals - 06/13/20 1713      PT LONG TERM GOAL #1   Title Patient will increase FOTO score  to equal to or greater than 54 to demonstrate statistically significant improvement in mobility and quality of life.    Baseline 06/06/2020: 46 (risk-adjusted 45)    Time 12    Period Weeks    Status New    Target Date 08/29/20      PT LONG TERM GOAL #2   Title Patient will increase 10 meter walk test to >1.14m/s as to improve gait speed for better community ambulation and to reduce fall risk.    Baseline 06/06/2020: 0.59 m/s with SPC    Time 12    Period Weeks    Status New    Target Date 08/29/20      PT LONG TERM GOAL #3   Title Patient will increase Berg Balance score by > 6 points to demonstrate decreased fall risk during functional activities.    Baseline 06/06/2020: 40/56    Time 12    Period Weeks    Status New    Target Date 08/29/20      PT LONG TERM GOAL #4   Title Patient (> 25 years old) will complete five times sit to stand test in < 15 seconds indicating an increased LE strength and improved balance.    Baseline 06/06/2020: 18 sec performed with BUE assist off chair    Time 12    Period Weeks    Status New    Target Date 08/29/20      PT LONG TERM GOAL #5   Title Patient will reduce dizziness handicap inventory score by at least 18 points, for less dizziness with ADLs and increased safety with home and work tasks.    Baseline 06/06/2020: 48% (moderate handicap)    Time 12    Period Weeks    Status New    Target Date 08/29/20      Additional Long Term Goals   Additional Long Term Goals Yes      PT LONG TERM GOAL #6   Title Patient will increase Functional Gait Assessment score to >20/30 as to reduce fall risk and improve dynamic  gait safety with community ambulation.    Baseline 06/13/2020: 15/30    Time 11    Period Weeks    Status New    Target Date 08/29/20                 Plan - 06/13/20 1709    Clinical Impression Statement Pt performed FGA today for further assessment of balance. The pt scored 15/30 indicating impairment of dynamic balance and increased risk for falls. PT also issued pt HEP this session (see note) via handout. PT reviewed the exercises with the pt in session. Pt verbalized understanding and returned demonstration for his HEP. Pt will continue to benefit from further skilled PT to improve BLE strength, and balance in order to decrease fall risk.    Personal Factors and Comorbidities Age;Comorbidity 3+;Fitness;Comorbidity 1;Comorbidity 2;Past/Current Experience;Time since onset of injury/illness/exacerbation    Comorbidities Pertinent PMH/comorbidities per chart: Exertional dyspnea, centrilobular emphysema, angina, CAD, acute on chronic HFrEF, controlled DMII with diabetic nephropathy, diabetic neuropathy, gout, CKD, essential HTN, paroxysmal atrial fibrillation, chronic anticoagulation, CKD, fatigue, iron deficiency anemia, pain of LE, OA of B knees, hx of partial ray amputation of R great toe, s/p cataract extraction status of eye, enthesopathy of ankle and tarsus, GERD, hyperlipidemia, hypothyroidism, nuclear sclerosis of L eye, psedophakia of R eye, ischemic cardiomyopathy, esophageal dysphagia, chronic heart failure with preserved ejection fraction, PSVT, coronary stent patent, bifascicular block, trifascicular  block, wound infection, urge incontinence of urine.    Examination-Activity Limitations Bathing;Bed Mobility;Bend;Caring for Others;Carry;Continence;Dressing;Hygiene/Grooming;Stairs;Squat;Reach Overhead;Locomotion Level;Lift;Stand;Toileting;Transfers    Examination-Participation Restrictions Medication Management;Cleaning;Meal Prep;Community Activity;Driving;Laundry;Shop;Yard  Work;Volunteer    Stability/Clinical Decision Making Evolving/Moderate complexity    Rehab Potential Good    PT Frequency 2x / week    PT Duration 12 weeks    PT Treatment/Interventions ADLs/Self Care Home Management;Biofeedback;Canalith Repostioning;Cryotherapy;Electrical Stimulation;Moist Heat;Traction;Ultrasound;DME Instruction;Gait training;Stair training;Functional mobility training;Therapeutic activities;Therapeutic exercise;Balance training;Neuromuscular re-education;Patient/family education;Orthotic Fit/Training;Manual techniques;Wheelchair mobility training;Passive range of motion;Dry needling;Energy conservation;Taping;Vestibular;Joint Manipulations;Spinal Manipulations;Aquatic Therapy    PT Next Visit Plan initiate balance exercises, TUG, FGA, progress LE strengthening    PT Home Exercise Plan to be initiated next session    Consulted and Agree with Plan of Care Patient;Family member/caregiver    Family Member Consulted spouse           Patient will benefit from skilled therapeutic intervention in order to improve the following deficits and impairments:  Abnormal gait,Dizziness,Increased fascial restricitons,Impaired sensation,Improper body mechanics,Pain,Cardiopulmonary status limiting activity,Decreased coordination,Decreased mobility,Postural dysfunction,Decreased activity tolerance,Decreased endurance,Decreased range of motion,Hypomobility,Decreased strength,Decreased balance,Difficulty walking,Impaired flexibility  Visit Diagnosis: Unsteadiness on feet  Other abnormalities of gait and mobility  Muscle weakness (generalized)     Problem List Patient Active Problem List   Diagnosis Date Noted  . Urge incontinence of urine 12/22/2019  . Trifascicular block 10/20/2019  . Wound infection 10/08/2019  . Bifascicular block 05/13/2019  . Coronary stent patent 04/13/2019  . Chronic HFrEF (heart failure with reduced ejection fraction) ([place]) 03/19/2019  . PSVT (paroxysmal  supraventricular tachycardia) ([place]) 03/19/2019  . 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 ([place]) 08/24/2018  . CKD (chronic kidney disease), stage IV ([place]) 06/04/2018  . Osteoarthritis of knees, bilateral 06/02/2018  . Hyperlipidemia associated with type 2 diabetes mellitus ([place]) 05/06/2018  . Pain of lower extremity 03/06/2018  . Duodenitis 01/30/2018  . Chronic heart failure with preserved ejection fraction (HFpEF) ([place]) 01/23/2018  . Hematochezia   . Melena   . Rectal bleeding 01/11/2018  . AVM (arteriovenous malformation) of colon without hemorrhage   . Schatzki's ring   . Esophageal dysphagia   . Diabetic retinopathy ([place]) 07/08/2017  . Ischemic cardiomyopathy 06/26/2017  . Cardiomyopathy ([place]) 06/26/2017  . Centrilobular emphysema ([place]) 06/13/2017  . Fatigue 12/18/2016  . Stable angina ([place]) 09/20/2016  . Stage 3a chronic kidney disease ([place]) 09/20/2016  . Paroxysmal atrial fibrillation ([place]) 08/28/2016  . Chronic anticoagulation 08/28/2016  . Essential hypertension 08/20/2016  . 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 heart with stable angina pectoris ([place]) 08/19/2016  . Accelerating angina ([place]) 08/15/2016  . Exertional dyspnea 08/12/2016  . 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[place]) 12/29/2015  . Gynecomastia, male 03/31/2014  . Hypertrophy of breast 03/31/2014  . Nuclear sclerosis of left eye 10/22/2013  . Enthesopathy of ankle and tarsus 04/10/2013  . Controlled type 2 diabetes mellitus with diabetic nephropathy ([place]) 04/09/2013  . Diabetic neuropathy associated with type 2 diabetes mellitus ([place]) 04/09/2013  . Cataract of left eye 11/25/2011  . Corneal opacity 09/30/2011  . Pseudophakia of right eye 09/30/2011  . GERD (gastroesophageal reflux disease) 09/25/2011  . Gout 09/25/2011  . Hyperlipidemia LDL goal <70 09/25/2011  . Status post cataract extraction 04/11/2011  . Cataract extraction status of eye 04/11/2011  . Iron deficiency anemia 04/01/2011  .  Benign localized hyperplasia of prostate without urinary obstruction and other lower urinary tract symptoms (LUTS) 12/10/2010  . Enlarged prostate without lower urinary tract symptoms (luts) 12/10/2010  .  Colon cancer screening 10/11/2010  . Hypothyroidism 10/11/2010  . Hyperlipidemia 09/20/2003  . Essential and other specified forms of tremor 01/05/2001  . Essential tremor 01/05/2001  . Acute on chronic HFrEF (heart failure with reduced ejection fraction) ([place]) 06/22/1993   Ricard Dillon PT, DPT 06/13/2020, 5:21 PM  [place] [facility] SERVICES [address] [place], [place], [address] Phone: [phone]   Fax:  [phone]  Name: Lucas Caldwell MRN: [id number] Date of Birth: 1933-12-19

## 2020-06-14 ENCOUNTER — Other Ambulatory Visit: Payer: Self-pay

## 2020-06-14 ENCOUNTER — Other Ambulatory Visit: Payer: Self-pay | Admitting: Family Medicine

## 2020-06-14 ENCOUNTER — Ambulatory Visit (INDEPENDENT_AMBULATORY_CARE_PROVIDER_SITE_OTHER): Payer: Medicare HMO | Admitting: Family Medicine

## 2020-06-14 ENCOUNTER — Encounter: Payer: Self-pay | Admitting: Family Medicine

## 2020-06-14 VITALS — BP 118/56 | HR 75 | Temp 97.3°F | Resp 18 | Ht 66.0 in | Wt 203.6 lb

## 2020-06-14 DIAGNOSIS — E1142 Type 2 diabetes mellitus with diabetic polyneuropathy: Secondary | ICD-10-CM | POA: Diagnosis not present

## 2020-06-14 DIAGNOSIS — E876 Hypokalemia: Secondary | ICD-10-CM

## 2020-06-14 DIAGNOSIS — E1121 Type 2 diabetes mellitus with diabetic nephropathy: Secondary | ICD-10-CM

## 2020-06-14 DIAGNOSIS — E785 Hyperlipidemia, unspecified: Secondary | ICD-10-CM

## 2020-06-14 DIAGNOSIS — E1169 Type 2 diabetes mellitus with other specified complication: Secondary | ICD-10-CM

## 2020-06-14 DIAGNOSIS — Z794 Long term (current) use of insulin: Secondary | ICD-10-CM

## 2020-06-14 DIAGNOSIS — D649 Anemia, unspecified: Secondary | ICD-10-CM

## 2020-06-14 LAB — POCT GLYCOSYLATED HEMOGLOBIN (HGB A1C): Hemoglobin A1C: 7.1 % — AB (ref 4.0–5.6)

## 2020-06-14 MED ORDER — POTASSIUM CHLORIDE ER 10 MEQ PO TBCR
10.0000 meq | EXTENDED_RELEASE_TABLET | Freq: Every day | ORAL | 1 refills | Status: DC
Start: 2020-06-14 — End: 2021-02-05

## 2020-06-14 NOTE — Assessment & Plan Note (Signed)
Chronic bilateral diabetic neuropathy in both feet, as complication of diabetes Additionally with R toe deformity, s/p partial amputation of R Great toe - with ulceration Evidence of callus formation both feet, heels and mid foot Failed Lyrica previously Followed by [facility] Refill Tramadol PRN for neuropathy pain flares - Continue Gabapentin 600mg  BID, cannot dose higher due to CKD  Completed DM Foot Exam today in office. See exam note.  - Proceed with ordering Diabetic Shoes, completed handwritten rx today, will fax back to [place] along with this office note to request order form. - Patient would benefit from Diabetic Shoes due to neuropathy with callus formation and great toe deformity causing pain, diabetes control is improving on current regimen, and I am continuing to monitor and manage diabetes.

## 2020-06-14 NOTE — Patient Instructions (Addendum)
Thank you for coming to the office today.  [address], [place] [address] [address] [place] Phone: [phone]  Orders faxed to [facility] for Diabetic Shoes - stay tuned may be next week until they are ready.  Recent Labs    09/08/19 1611 12/22/19 1610 06/14/20 1511  HGBA1C 7.1* 7.4* 7.1*   Keep on Glucerna 1-2 times a day  Keep on Tramadol for foot pain.  Reduce potassium pill from 75mEq down to 10 - you can chop the big ones in half, and take one half a day.  New order sent for the smaller one.    Please schedule a Follow-up Appointment to: Return in about 3 months (around 09/14/2020) for 3 month fasting lab only then 1 week later Follow-up DM, Potassium, HTN.  If you have any other questions or concerns, please feel free to call the office or send a message through [place]. You may also schedule an earlier appointment if necessary.  Additionally, you may be receiving a survey about your experience at our office within a few days to 1 week by e-mail or mail. We value your feedback.  [name], DO [place]

## 2020-06-14 NOTE — Assessment & Plan Note (Signed)
Controlled A1c 7.1, improved control lifestyle improving Complications - hypoglycemia (improved), CKD-III, peripheral neuropathy, cataracts, CAD  Plan:  1. Discussed DM management concern with CKD and risk of hypoglycemia - limited med options due to CKD - CONTINUE Trulicity 1.5mg  weekly - Continue reduced [name] 20u, in future can continue to gradually reduce dose by 1unit per week if fasting CBG < 150 consistently - Counseling on risk of hypoglycemia and symptoms 2. Encourage improved lifestyle - keep improving diet - limit carbs, limit sweet tea, inc water - he has poor health literacy on diabetes

## 2020-06-14 NOTE — Progress Notes (Signed)
Subjective:    Patient ID: Lucas Caldwell, male    DOB: [birth year], 87 y.o.   MRN: [id number]  Lucas Caldwell is a 87 y.o. 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 Tolerating well w/o side-effects CurrentlyOFFACEi(Enalapril DC'd by Cardiology) Lifestyle: He takes glucerna 1-2 times daily - Diet (improving DM diet, drinks some water) - Exercise (limited by dyspnea) Has diabetic shoes needs new pair of DM shoes now. Previously from [employer]. - Denies hypoglycemia, nausea vomiting, fever chills, swelling, polyuria  Diabetic Neuropathy See above  S/p R great toe amputation, has ulceration at amputation site with plantar pressure [facility] Dr Luana Shu,  Last seen 06/12/20 - Debridement of foot done 3/28, offloading of foot therapy done - He uses Scientist, clinical (histocompatibility and immunogenetics) for DM Shoes. - Taking Tramadol 50mg  1-2 at night PRN foot pain - Admits chronicDM Neuropathywith pain in feet bilateral,on Gabapentin with improvement, taking 300mg  x 2 pills twice a daybut still has neuropathy burning pain worse at night. Failed Lyrica in past.  Hypokalemia Last K was normal range 4.3 He now takes K Supplement 64mEq once daily. Large pills cause him difficulty  Balance / unsteadiness Continues with PT   Depression screen [employer] 2/9 12/22/2019 10/25/2019 09/09/2019  Decreased Interest 0 2 0  Down, Depressed, Hopeless 0 0 1  PHQ - 2 Score 0 2 1  Altered sleeping - 2 1  Tired, decreased energy - 1 1  Change in appetite - 0 2  Feeling bad or failure about yourself  - 1 0  Trouble concentrating - 0 0  Moving slowly or fidgety/restless  - 0 0  Suicidal thoughts - 0 0  PHQ-9 Score - 6 5  Difficult doing work/chores - Somewhat difficult Somewhat difficult  Some recent data might be hidden    Social History   Tobacco Use  . Smoking status: Former Smoker    Packs/day: 1.75    Years: 20.00    Pack years: 35.00    Types: Cigarettes    Start date: 1955    Quit date: 1975    Years since quitting: 4.2  . Smokeless tobacco: Former Network engineer  . Vaping Use: Never used  Substance Use Topics  . Alcohol use: No    Comment: previously drank heavily - quit 1979.  . Drug use: No    Review of Systems Per HPI unless specifically indicated above     Objective:    BP (!) 118/56 (BP Location: Right Arm, Patient Position: Sitting, Cuff Size: Large)   Pulse 75   Temp (!) 97.3 F (36.3 C) (Temporal)   Resp 18   Ht 5\' 6"  (1.676 m)   Wt 203 lb 9.6 oz (92.4 kg)   SpO2 98%   BMI 32.86 kg/m   Wt Readings from Last 3 Encounters:  06/14/20 203 lb 9.6 oz (92.4 kg)  05/19/20 202 lb 12.8 oz (92 kg)  04/18/20 203 lb (92.1 kg)    Physical Exam Vitals and nursing note reviewed.  Constitutional:      General: [name] not in acute distress.    Appearance: [name] well-developed. [name] not diaphoretic.  Comments: Well-appearing, comfortable, cooperative  HENT:     Head: Normocephalic and atraumatic.  Eyes:     General:        Right eye: No discharge.        Left eye: No discharge.     Conjunctiva/sclera: Conjunctivae normal.  Cardiovascular:     Rate and Rhythm: Normal rate.  Pulmonary:     Effort: Pulmonary effort is normal.  Musculoskeletal:     Comments: Uses cane  Skin:    General: Skin is warm and dry.     Findings: No erythema or rash.  Neurological:     Mental Status: [name] alert and oriented to person, place, and time.  Psychiatric:        Behavior: Behavior normal.     Comments: Well groomed, good eye contact, normal speech and thoughts      Diabetic Foot Exam - Simple   Simple Foot  Form Diabetic Foot exam was performed with the following findings: Yes 06/14/2020  3:17 PM  Visual Inspection See comments: Yes Sensation Testing See comments: Yes Pulse Check Posterior Tibialis and Dorsalis pulse intact bilaterally: Yes Comments Right foot plantar reduced monofilament sensation overall. Intact dorsal monofilament sensation. Left foot mostly intact monofilament sensation. Generalized mild callus formation both feet heel and great toe. S/p chronic R great toe amputation, with secondary ulceration at pressure point.     Recent Labs    09/08/19 1611 12/22/19 1610 06/14/20 1511  HGBA1C 7.1* 7.4* 7.1*    Results for orders placed or performed in visit on 06/14/20  POCT glycosylated hemoglobin (Hb A1C)  Result Value Ref Range   Hemoglobin A1C 7.1 (A) 4.0 - 5.6 %      Assessment & Plan:   Problem List Items Addressed This Visit    Diabetic neuropathy associated with type 2 diabetes mellitus ([place])    Chronic bilateral diabetic neuropathy in both feet, as complication of diabetes Additionally with R toe deformity, s/p partial amputation of R Great toe - with ulceration Evidence of callus formation both feet, heels and mid foot Failed Lyrica previously Followed by [place] [place] Refill Tramadol PRN for neuropathy pain flares - Continue Gabapentin 600mg  BID, cannot dose higher due to CKD  Completed DM Foot Exam today in office. See exam note.  - Proceed with ordering Diabetic Shoes, completed handwritten rx today, will fax back to [place] along with this office note to request order form. - Patient would benefit from Diabetic Shoes due to neuropathy with callus formation and great toe deformity causing pain, diabetes control is improving on current regimen, and I am continuing to monitor and manage diabetes.       Controlled type 2 diabetes mellitus with diabetic nephropathy (HCC) - Primary    Controlled A1c 7.1, improved  control lifestyle improving Complications - hypoglycemia (improved), CKD-III, peripheral neuropathy, cataracts, CAD  Plan:  1. Discussed DM management concern with CKD and risk of hypoglycemia - limited med options due to CKD - CONTINUE Trulicity 1.5mg  weekly - Continue reduced [name] 20u, in future can continue to gradually reduce dose by 1unit per week if fasting CBG < 150 consistently - Counseling on risk of hypoglycemia and symptoms 2. Encourage improved lifestyle - keep improving diet - limit carbs, limit sweet tea, inc water - he has poor health literacy on diabetes      Relevant Orders   POCT glycosylated hemoglobin (Hb A1C) (Completed)    Other Visit Diagnoses  Hypokalemia       Relevant Medications   potassium chloride (KLOR-CON) 10 MEQ tablet    Previously stable Potassium on lower dose supplement 48mEq daily, at 4.3, he has inc some K in diet Will reduce once more to Potassium 70mEq smaller dose/pill Repeat chemistry in 3 months.    Meds ordered this encounter  Medications  . potassium chloride (KLOR-CON) 10 MEQ tablet    Sig: Take 1 tablet (10 mEq total) by mouth daily.    Dispense:  90 tablet    Refill:  1    Discontinue 26mEq switch to 10      Follow up plan: Return in about 3 months (around 09/14/2020) for 3 month fasting lab only then 1 week later Follow-up DM, Potassium, HTN.  Future labs ordered 08/2020  [name], [facility] 06/14/2020, 3:10 PM

## 2020-06-15 ENCOUNTER — Ambulatory Visit: Payer: Medicare HMO | Admitting: Physical Therapy

## 2020-06-19 DIAGNOSIS — E1121 Type 2 diabetes mellitus with diabetic nephropathy: Secondary | ICD-10-CM | POA: Diagnosis not present

## 2020-06-19 DIAGNOSIS — L97409 Non-pressure chronic ulcer of unspecified heel and midfoot with unspecified severity: Secondary | ICD-10-CM | POA: Diagnosis not present

## 2020-06-20 ENCOUNTER — Other Ambulatory Visit: Payer: Self-pay

## 2020-06-20 ENCOUNTER — Ambulatory Visit: Payer: Medicare HMO | Attending: Family Medicine

## 2020-06-20 DIAGNOSIS — M6281 Muscle weakness (generalized): Secondary | ICD-10-CM | POA: Diagnosis not present

## 2020-06-20 DIAGNOSIS — R2681 Unsteadiness on feet: Secondary | ICD-10-CM | POA: Diagnosis not present

## 2020-06-20 DIAGNOSIS — R2689 Other abnormalities of gait and mobility: Secondary | ICD-10-CM | POA: Diagnosis not present

## 2020-06-20 DIAGNOSIS — R278 Other lack of coordination: Secondary | ICD-10-CM | POA: Insufficient documentation

## 2020-06-20 NOTE — Therapy (Signed)
[place] MAIN [facility] SERVICES [address] [place], [place], [address] Phone: [phone]   Fax:  [phone]  Physical Therapy Treatment  Patient Details  Name: Lucas Caldwell MRN: [id number] Date of Birth: [birth year] Referring Provider (PT): [name], DO   Encounter Date: 06/20/2020   PT End of Session - 06/20/20 1547    Visit Number 3    Number of Visits 25    Date for PT Re-Evaluation 08/29/20    Authorization Time Period eval performed on 06/06/2020    PT Start Time 1426    PT Stop Time 1514    PT Time Calculation (min) 48 min    Equipment Utilized During Treatment Gait belt    Activity Tolerance Patient tolerated treatment well    Behavior During Therapy [facility] for tasks assessed/performed           Past Medical History:  Diagnosis Date  . Arthritis    "probably in his legs" (08/15/2016)  . Blind right eye   . BPH (benign prostatic hyperplasia)   . Breast asymmetry    Left breast is larger, present for several years.  [name] CAD (coronary artery disease)    a. Cath in the late 90's - reportedly ok;  b. 2014 s/p stenting x 2 @ [facility]; c 08/19/16 Cath/PCI with DES -> RCA, plan to treat LM 70% medically. Seen by surgery and felt to be too high risk for CABG; d. 08/2018 Cath: LM 70 (iFR 0.86), LAD 20ost, 40p, 50/70m, D1 70, LCX patent stent, OM1 30, RCA patent stent, 52m ISR, 40d, RPDA 30. PCWP 8. CO/CI 4.0/2.1; e. 03/2019 PCI to LAD (2.5x15 Resolute Onyx DES).  . Chronic combined systolic (congestive) and diastolic (congestive) heart failure ([place])    a. Previously reduced EF-->50% by echo in 2012;  b. 06/2015 Echo: EF 50-55%  c. 07/2016 Echo: EF 45-50%; d. 12/2017 Echo: EF 55%, Gr1 DD, mild MR; e. 08/2018 Echo: EF 35-40%, mildly dil PA; f. 04/2019 Echo: EF 30-35%, glob HK, nl RV fxn; g. s/p Cardiomems.  . Chronic kidney disease (CKD), stage IV (severe) ([place])   . CML (chronic myelocytic leukemia) ([place])   . GERD (gastroesophageal reflux  disease)   . GIB (gastrointestinal bleeding)    a. 12/2017 3 unit PRBC GIB in setting of coumadin-->Endo/colon multiple duodenal ulcers and a single bleeding ulcer in the proximal ascending colon status post hemostatic clipping x2.  . Gout   . Hyperlipemia   . Hypertension   . Hypothyroidism   . Iron deficiency anemia   . Ischemic cardiomyopathy    a. Previously reduced EF-->50% by echo in 2012;  b. 06/2015 Echo: EF 50-55%, Gr2 DD, mild MR, mildly dil LA, Ao sclerosis, mild TR;  c. 07/2016 Echo: EF 45-50%; d. 12/2017 Echo: EF 55%, Gr1 DD, mild MR; e. 08/2018 Echo: EF 35-40%; f. 04/2019 Echo: EF 30-35%.  . Migraine    "in the 1960s" (08/15/2016)  . PAF (paroxysmal atrial fibrillation) (HCC)    a. ? Dx 2014-->s/p DCCV;  b. CHA2DS2VASc = 6--> Coumadin.  . Prostate cancer ([place])   . RBBB   . Type II diabetes mellitus ([place])     Past Surgical History:  Procedure Laterality Date  . CATARACT EXTRACTION W/ INTRAOCULAR LENS  IMPLANT, BILATERAL Bilateral   . COLONOSCOPY N/A 01/13/2018   Procedure: COLONOSCOPY;  Surgeon: Lin Landsman, MD;  Location[facility] ENDOSCOPY;  Service: Gastroenterology;  Laterality: N/A;  . COLONOSCOPY WITH PROPOFOL N/A 01/01/2018  Procedure: COLONOSCOPY WITH PROPOFOL;  Surgeon: Lin Landsman, MD;  Location: [facility] ENDOSCOPY;  Service: Gastroenterology;  Laterality: N/A;  . CORONARY ANGIOPLASTY WITH STENT PLACEMENT  09/2012   2 stents  . CORONARY STENT INTERVENTION N/A 08/19/2016   Procedure: Coronary Stent Intervention;  Surgeon: [name], MD;  Location: [place] CV LAB;  Service: Cardiovascular;  Laterality: N/A;  . CORONARY STENT INTERVENTION N/A 04/13/2019   Procedure: CORONARY STENT INTERVENTION;  Surgeon: [name], MD;  Location: [place] CV LAB;  Service: Cardiovascular;  Laterality: N/A;  . ESOPHAGEAL MANOMETRY N/A 12/16/2017   Procedure: ESOPHAGEAL MANOMETRY (EM);  Surgeon: Lin Landsman, MD;  Location: [facility] ENDOSCOPY;  Service:  Gastroenterology;  Laterality: N/A;  . ESOPHAGOGASTRODUODENOSCOPY N/A 12/20/2016   Procedure: ESOPHAGOGASTRODUODENOSCOPY (EGD);  Surgeon: Lin Landsman, MD;  Location[facility] ENDOSCOPY;  Service: Gastroenterology;  Laterality: N/A;  . ESOPHAGOGASTRODUODENOSCOPY N/A 01/13/2018   Procedure: ESOPHAGOGASTRODUODENOSCOPY (EGD);  Surgeon: Lin Landsman, MD;  Location: [facility] ENDOSCOPY;  Service: Gastroenterology;  Laterality: N/A;  . ESOPHAGOGASTRODUODENOSCOPY (EGD) WITH PROPOFOL N/A 11/03/2017   Procedure: ESOPHAGOGASTRODUODENOSCOPY (EGD) WITH PROPOFOL;  Surgeon: Lin Landsman, MD;  Location: [facility] ENDOSCOPY;  Service: Gastroenterology;  Laterality: N/A;  . EYE SURGERY    . HERNIA REPAIR     "navel"  . LEFT HEART CATH AND CORONARY ANGIOGRAPHY N/A 08/14/2016   Procedure: Left Heart Cath and Coronary Angiography;  Surgeon: [name], MD;  Location: [place] CV LAB;  Service: Cardiovascular;  Laterality: N/A;  . PRESSURE SENSOR/CARDIOMEMS N/A 09/23/2019   Procedure: PRESSURE SENSOR/CARDIOMEMS;  Surgeon: Jolaine Artist, MD;  Location: [place] CV LAB;  Service: Cardiovascular;  Laterality: N/A;  . RIGHT HEART CATH N/A 09/23/2019   Procedure: RIGHT HEART CATH;  Surgeon: Jolaine Artist, MD;  Location: [place] CV LAB;  Service: Cardiovascular;  Laterality: N/A;  . RIGHT/LEFT HEART CATH AND CORONARY ANGIOGRAPHY N/A 09/01/2018   Procedure: RIGHT/LEFT HEART CATH AND CORONARY ANGIOGRAPHY;  Surgeon: [name], MD;  Location: [place] CV LAB;  Service: Cardiovascular;  Laterality: N/A;  . RIGHT/LEFT HEART CATH AND CORONARY ANGIOGRAPHY N/A 04/13/2019   Procedure: RIGHT/LEFT HEART CATH AND CORONARY ANGIOGRAPHY;  Surgeon: [name], MD;  Location: [place] CV LAB;  Service: Cardiovascular;  Laterality: N/A;  . TOE AMPUTATION Right    "big toe"    There were no vitals filed for this visit.   Subjective Assessment - 06/20/20 1427    Subjective Pt reports  difficult weekend and reports his sister in the hospital. Pt repots doing some of his HEP.    Patient is accompained by: Family member    Pertinent History Pt is an 87 y/o male presenting to physical therapy today with spouse with reports of difficulty with balance, dizziness, and walking. Pt states he started feeling "swimmy headed" approximately one year ago and that it is affecting his balance with walking and has been progressively worsening. Pt reports he feels he is moving when he's still. He denies spinning sensation. He reports he sometimes has headache but says "they are not that bad." Pt reports feeling SOB with walking and household tasks (his doctor is aware). Pt denies unintentional weight gain or loss. Pt reports 1 fall the last 6 months. Per spouse pt tripped at time of fall and do not provide further details. Pt with extensive PMH. Relevant PMH per chart: Exertional dyspnea, centrilobular emphysema, angina, CAD, acute on chronic HFrEF, controlled DMII with diabetic nephropathy, diabetic neuropathy,  gout, CKD, essential HTN, paroxysmal  atrial fibrillation, chronic anticoagulation, CKD, fatigue, iron deficiency anemia, pain of LE, OA of B knees, hx of partial ray amputation of R great toe, s/p cataract extraction status of eye, enthesopathy of ankle and tarsus, GERD, hyperlipidemia, hypothyroidism, nuclear sclerosis of L eye, psedophakia of R eye, ischemic cardiomyopathy, esophageal dysphagia, chronic heart failure with preserved ejection fraction, PSVT, coronary stent patent, bifascicular block, trifascicular block, wound infection, urge incontinence of urine.    Limitations House hold activities;Lifting;Standing;Walking;Writing   Pt reports when he tries to write his hand shakes a lot   How long Lucas you sit comfortably? not limited    How long Lucas you stand comfortably? 5 minutes    How long Lucas you walk comfortably? 10 minutes    Diagnostic tests DG Chest 2 View  from 07/06/2019 per chart: "   IMPRESSION:  No acute abnormalities."    Patient Stated Goals Feel more steady with walking    Currently in Pain? No/denies          TREATMENT  THEREX:  Amb. For agility/increased gait speed over 10 meters, CGA: Trial 1: 11 sec Trial 2: 11 sec Trial 3: 12 sec Trial 4: 13 sec At end of trials SPO2 94%, HR 85 bpm  Amb over 10 meters for increased step-length/hip extension, close CGA: trials for step length  Trial 1: 22 steps Trial 2: 20 steps Trial 3: 20 steps Trial 4: 19 steps At end of trials SPO2 98%, HR 83 bpm  Step up/down onto 6" step at support surface - 2x10 with RLE leading and LLE leading, CGA; VC, demo for technique  Lateral step up/down 10x each side, CGA; VC, demo for technique  Neuro Re-Ed: Obstacle course around cones with stepping over objects and onto step and off of step - 6x, CGA, pt using SPC; Pt with difficulty clearing steps, particularly when stepping down from step. Pt knocked over a few cones.  At support bar: Standing airex pad WBOS 2x30 sec, CGA; pt exhibits improved postural stability with reps  Standing airex pad NBOS 2x30 sec, CGA; VC for technique  Standing twists WBOS, 12x, CGA; VC/Demo for technique  Standing twists NBOS; 12x, CGA-min assist x1; VC for technique; Pt requiring intermittent UE support to maintain balance  STS with airex pad under feet 8x, CGA- min assist  Education provided throughout in the form of demo, VC/TC to facilitate movement at target joints and correct muscle activation with exercises.   Assessment: Pt continues to exhibit difficulty increasing step-length and gait speed during ambulation exercises. However, he did see modest improvement ambulating in fewer steps over 10 meters from 22 steps to 19, but did exhibit decreased postural stability, requiring close CGA. During obstacle course pt did have difficulty navigating cones, knocking over a few, and did not fully clear obstacles. PT had pt practice step-ups  following obstacle course to target B foot clearance, where pt saw improvement within session. The pt will benefit from further skilled therapy to improve balance, and BLE strength in order to increase safety with all functional mobility.    PT Education - 06/20/20 1549    Education Details body mechanics, exercise technique with step-ups    Person(s) Educated Patient    Methods Explanation    Comprehension Verbalized understanding;Returned demonstration            PT Short Term Goals - 06/07/20 0811      PT SHORT TERM GOAL #1   Title Patient will be independent in home exercise program  to improve strength/mobility for better functional independence with ADLs.    Baseline 06/06/2020: HEP to be initiated next visit    Time 6    Period Weeks    Status New    Target Date 07/18/20             PT Long Term Goals - 06/13/20 1713      PT LONG TERM GOAL #1   Title Patient will increase FOTO score to equal to or greater than 54 to demonstrate statistically significant improvement in mobility and quality of life.    Baseline 06/06/2020: 46 (risk-adjusted 45)    Time 12    Period Weeks    Status New    Target Date 08/29/20      PT LONG TERM GOAL #2   Title Patient will increase 10 meter walk test to >1.18m/s as to improve gait speed for better community ambulation and to reduce fall risk.    Baseline 06/06/2020: 0.59 m/s with SPC    Time 12    Period Weeks    Status New    Target Date 08/29/20      PT LONG TERM GOAL #3   Title Patient will increase Berg Balance score by > 6 points to demonstrate decreased fall risk during functional activities.    Baseline 06/06/2020: 40/56    Time 12    Period Weeks    Status New    Target Date 08/29/20      PT LONG TERM GOAL #4   Title Patient (> 24 years old) will complete five times sit to stand test in < 15 seconds indicating an increased LE strength and improved balance.    Baseline 06/06/2020: 18 sec performed with BUE assist off chair     Time 12    Period Weeks    Status New    Target Date 08/29/20      PT LONG TERM GOAL #5   Title Patient will reduce dizziness handicap inventory score by at least 18 points, for less dizziness with ADLs and increased safety with home and work tasks.    Baseline 06/06/2020: 48% (moderate handicap)    Time 12    Period Weeks    Status New    Target Date 08/29/20      Additional Long Term Goals   Additional Long Term Goals Yes      PT LONG TERM GOAL #6   Title Patient will increase Functional Gait Assessment score to >20/30 as to reduce fall risk and improve dynamic gait safety with community ambulation.    Baseline 06/13/2020: 15/30    Time 11    Period Weeks    Status New    Target Date 08/29/20            Plan - 06/20/20 1546    Clinical Impression Statement Pt continues to exhibit difficulty increasing step-length and gait speed during ambulation exercises. However, he did see modest improvement ambulating in fewer steps over 10 meters from 22 steps to 19, but did exhibit decreased postural stability, requiring close CGA. During obstacle course pt did have difficulty navigating cones, knocking over a few, and did not fully clear obstacles. PT had pt practice step-ups following obstacle course to target B foot clearance, where pt saw improvement within session. The pt will benefit from further skilled therapy to improve balance, and BLE strength in order to increase safety with all functional mobility.    Personal Factors and Comorbidities Age;Comorbidity 3+;Fitness;Comorbidity 1;Comorbidity 2;Past/Current  Experience;Time since onset of injury/illness/exacerbation    Comorbidities Pertinent PMH/comorbidities per chart: Exertional dyspnea, centrilobular emphysema, angina, CAD, acute on chronic HFrEF, controlled DMII with diabetic nephropathy, diabetic neuropathy, gout, CKD, essential HTN, paroxysmal atrial fibrillation, chronic anticoagulation, CKD, fatigue, iron deficiency anemia,  pain of LE, OA of B knees, hx of partial ray amputation of R great toe, s/p cataract extraction status of eye, enthesopathy of ankle and tarsus, GERD, hyperlipidemia, hypothyroidism, nuclear sclerosis of L eye, psedophakia of R eye, ischemic cardiomyopathy, esophageal dysphagia, chronic heart failure with preserved ejection fraction, PSVT, coronary stent patent, bifascicular block, trifascicular block, wound infection, urge incontinence of urine.    Examination-Activity Limitations Bathing;Bed Mobility;Bend;Caring for Others;Carry;Continence;Dressing;Hygiene/Grooming;Stairs;Squat;Reach Overhead;Locomotion Level;Lift;Stand;Toileting;Transfers    Examination-Participation Restrictions Medication Management;Cleaning;Meal Prep;Community Activity;Driving;Laundry;Shop;Yard Work;Volunteer    Stability/Clinical Decision Making Evolving/Moderate complexity    Rehab Potential Good    PT Frequency 2x / week    PT Duration 12 weeks    PT Treatment/Interventions ADLs/Self Care Home Management;Biofeedback;Canalith Repostioning;Cryotherapy;Electrical Stimulation;Moist Heat;Traction;Ultrasound;DME Instruction;Gait training;Stair training;Functional mobility training;Therapeutic activities;Therapeutic exercise;Balance training;Neuromuscular re-education;Patient/family education;Orthotic Fit/Training;Manual techniques;Wheelchair mobility training;Passive range of motion;Dry needling;Energy conservation;Taping;Vestibular;Joint Manipulations;Spinal Manipulations;Aquatic Therapy    PT Next Visit Plan initiate balance exercises, TUG, FGA, progress LE strengthening, foot clearance exercises    Consulted and Agree with Plan of Care Patient;Family member/caregiver    Family Member Consulted spouse           Patient will benefit from skilled therapeutic intervention in order to improve the following deficits and impairments:  Abnormal gait,Dizziness,Increased fascial restricitons,Impaired sensation,Improper body  mechanics,Pain,Cardiopulmonary status limiting activity,Decreased coordination,Decreased mobility,Postural dysfunction,Decreased activity tolerance,Decreased endurance,Decreased range of motion,Hypomobility,Decreased strength,Decreased balance,Difficulty walking,Impaired flexibility  Visit Diagnosis: Unsteadiness on feet  Other abnormalities of gait and mobility  Muscle weakness (generalized)     Problem List Patient Active Problem List   Diagnosis Date Noted  . Urge incontinence of urine 12/22/2019  . Trifascicular block 10/20/2019  . Wound infection 10/08/2019  . Bifascicular block 05/13/2019  . Coronary stent patent 04/13/2019  . Chronic HFrEF (heart failure with reduced ejection fraction) ([place]) 03/19/2019  . PSVT (paroxysmal supraventricular tachycardia) ([place]) 03/19/2019  . 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 CKD (chronic kidney disease), stage IV ([place]) 06/04/2018  . Osteoarthritis of knees, bilateral 06/02/2018  . Hyperlipidemia associated with type 2 diabetes mellitus ([place]) 05/06/2018  . Pain of lower extremity 03/06/2018  . Duodenitis 01/30/2018  . Chronic heart failure with preserved ejection fraction (HFpEF) ([place]) 01/23/2018  . Hematochezia   . Melena   . Rectal bleeding 01/11/2018  . AVM (arteriovenous malformation) of colon without hemorrhage   . Schatzki's ring   . Esophageal dysphagia   . Diabetic retinopathy ([place]) 07/08/2017  . Ischemic cardiomyopathy 06/26/2017  . Cardiomyopathy ([place]) 06/26/2017  . Centrilobular emphysema ([place]) 06/13/2017  . Fatigue 12/18/2016  . Stable angina ([place]) 09/20/2016  . Stage 3a chronic kidney disease ([place]) 09/20/2016  . Paroxysmal atrial fibrillation ([place]) 08/28/2016  . Chronic anticoagulation 08/28/2016  . Essential hypertension 08/20/2016  . 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 heart with stable angina pectoris ([place]) 08/19/2016  . Accelerating angina ([place]) 08/15/2016  .  Exertional dyspnea 08/12/2016  . 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[place]) 12/29/2015  . Gynecomastia, male 03/31/2014  . Hypertrophy of breast 03/31/2014  . Nuclear sclerosis of left eye 10/22/2013  . Enthesopathy of ankle and tarsus 04/10/2013  . Controlled type 2 diabetes mellitus with diabetic nephropathy ([place]) 04/09/2013  . Diabetic neuropathy associated with type 2 diabetes  mellitus ([place]) 04/09/2013  . Cataract of left eye 11/25/2011  . Corneal opacity 09/30/2011  . Pseudophakia of right eye 09/30/2011  . GERD (gastroesophageal reflux disease) 09/25/2011  . Gout 09/25/2011  . Hyperlipidemia LDL goal <70 09/25/2011  . Status post cataract extraction 04/11/2011  . Cataract extraction status of eye 04/11/2011  . Iron deficiency anemia 04/01/2011  . Benign localized hyperplasia of prostate without urinary obstruction and other lower urinary tract symptoms (LUTS) 12/10/2010  . Enlarged prostate without lower urinary tract symptoms (luts) 12/10/2010  . Colon cancer screening 10/11/2010  . Hypothyroidism 10/11/2010  . Hyperlipidemia 09/20/2003  . Essential and other specified forms of tremor 01/05/2001  . Essential tremor 01/05/2001   Ricard Dillon PT, DPT 06/20/2020, 3:50 PM  [place] MAIN [facility] SERVICES [address] [place], [place], [address] Phone: [phone]   Fax:  [phone]  Name: Lucas Caldwell MRN: [id number] Date of Birth: 04/30/1933

## 2020-06-22 ENCOUNTER — Ambulatory Visit: Payer: Medicare HMO

## 2020-06-22 ENCOUNTER — Other Ambulatory Visit: Payer: Self-pay

## 2020-06-22 DIAGNOSIS — R2681 Unsteadiness on feet: Secondary | ICD-10-CM | POA: Diagnosis not present

## 2020-06-22 DIAGNOSIS — M6281 Muscle weakness (generalized): Secondary | ICD-10-CM

## 2020-06-22 DIAGNOSIS — R278 Other lack of coordination: Secondary | ICD-10-CM | POA: Diagnosis not present

## 2020-06-22 DIAGNOSIS — R2689 Other abnormalities of gait and mobility: Secondary | ICD-10-CM

## 2020-06-22 NOTE — Therapy (Signed)
[place] MAIN [facility] SERVICES [address] [place], [place], [address] Phone: [phone]   Fax:  [phone]  Physical Therapy Treatment  Patient Details  Name: Lucas Caldwell MRN: [id number] Date of Birth: April 01, 1933 Referring Provider (PT): [name], DO   Encounter Date: 06/22/2020   PT End of Session - 06/22/20 1532    Visit Number 4    Number of Visits 25    Date for PT Re-Evaluation 08/29/20    Authorization Time Period eval performed on 06/06/2020    PT Start Time 1421    PT Stop Time 1507    PT Time Calculation (min) 46 min    Equipment Utilized During Treatment Gait belt    Activity Tolerance Patient tolerated treatment well    Behavior During Therapy [facility] for tasks assessed/performed           Past Medical History:  Diagnosis Date  . Arthritis    "probably in his legs" (08/15/2016)  . Blind right eye   . BPH (benign prostatic hyperplasia)   . Breast asymmetry    Left breast is larger, present for several years.  [name] CAD (coronary artery disease)    a. Cath in the late 90's - reportedly ok;  b. 2014 s/p stenting x 2 @ [facility]; c 08/19/16 Cath/PCI with DES -> RCA, plan to treat LM 70% medically. Seen by surgery and felt to be too high risk for CABG; d. 08/2018 Cath: LM 70 (iFR 0.86), LAD 20ost, 40p, 50/23m, D1 70, LCX patent stent, OM1 30, RCA patent stent, 83m ISR, 40d, RPDA 30. PCWP 8. CO/CI 4.0/2.1; e. 03/2019 PCI to LAD (2.5x15 Resolute Onyx DES).  . Chronic combined systolic (congestive) and diastolic (congestive) heart failure ([place])    a. Previously reduced EF-->50% by echo in 2012;  b. 06/2015 Echo: EF 50-55%  c. 07/2016 Echo: EF 45-50%; d. 12/2017 Echo: EF 55%, Gr1 DD, mild MR; e. 08/2018 Echo: EF 35-40%, mildly dil PA; f. 04/2019 Echo: EF 30-35%, glob HK, nl RV fxn; g. s/p Cardiomems.  . Chronic kidney disease (CKD), stage IV (severe) ([place])   . CML (chronic myelocytic leukemia) ([place])   . GERD (gastroesophageal reflux  disease)   . GIB (gastrointestinal bleeding)    a. 12/2017 3 unit PRBC GIB in setting of coumadin-->Endo/colon multiple duodenal ulcers and a single bleeding ulcer in the proximal ascending colon status post hemostatic clipping x2.  . Gout   . Hyperlipemia   . Hypertension   . Hypothyroidism   . Iron deficiency anemia   . Ischemic cardiomyopathy    a. Previously reduced EF-->50% by echo in 2012;  b. 06/2015 Echo: EF 50-55%, Gr2 DD, mild MR, mildly dil LA, Ao sclerosis, mild TR;  c. 07/2016 Echo: EF 45-50%; d. 12/2017 Echo: EF 55%, Gr1 DD, mild MR; e. 08/2018 Echo: EF 35-40%; f. 04/2019 Echo: EF 30-35%.  . Migraine    "in the 1960s" (08/15/2016)  . PAF (paroxysmal atrial fibrillation) (HCC)    a. ? Dx 2014-->s/p DCCV;  b. CHA2DS2VASc = 6--> Coumadin.  . Prostate cancer ([place])   . RBBB   . Type II diabetes mellitus ([place])     Past Surgical History:  Procedure Laterality Date  . CATARACT EXTRACTION W/ INTRAOCULAR LENS  IMPLANT, BILATERAL Bilateral   . COLONOSCOPY N/A 01/13/2018   Procedure: COLONOSCOPY;  Surgeon: Lin Landsman, MD;  Location: [facility] ENDOSCOPY;  Service: Gastroenterology;  Laterality: N/A;  . COLONOSCOPY WITH PROPOFOL N/A 01/01/2018  Procedure: COLONOSCOPY WITH PROPOFOL;  Surgeon: Lin Landsman, MD;  Location: [facility] ENDOSCOPY;  Service: Gastroenterology;  Laterality: N/A;  . CORONARY ANGIOPLASTY WITH STENT PLACEMENT  09/2012   2 stents  . CORONARY STENT INTERVENTION N/A 08/19/2016   Procedure: Coronary Stent Intervention;  Surgeon: [name], MD;  Location: [place] CV LAB;  Service: Cardiovascular;  Laterality: N/A;  . CORONARY STENT INTERVENTION N/A 04/13/2019   Procedure: CORONARY STENT INTERVENTION;  Surgeon: [name], MD;  Location: [place] CV LAB;  Service: Cardiovascular;  Laterality: N/A;  . ESOPHAGEAL MANOMETRY N/A 12/16/2017   Procedure: ESOPHAGEAL MANOMETRY (EM);  Surgeon: Lin Landsman, MD;  Location: [facility] ENDOSCOPY;  Service:  Gastroenterology;  Laterality: N/A;  . ESOPHAGOGASTRODUODENOSCOPY N/A 12/20/2016   Procedure: ESOPHAGOGASTRODUODENOSCOPY (EGD);  Surgeon: Lin Landsman, MD;  Location: [facility] ENDOSCOPY;  Service: Gastroenterology;  Laterality: N/A;  . ESOPHAGOGASTRODUODENOSCOPY N/A 01/13/2018   Procedure: ESOPHAGOGASTRODUODENOSCOPY (EGD);  Surgeon: Lin Landsman, MD;  Location: [facility] ENDOSCOPY;  Service: Gastroenterology;  Laterality: N/A;  . ESOPHAGOGASTRODUODENOSCOPY (EGD) WITH PROPOFOL N/A 11/03/2017   Procedure: ESOPHAGOGASTRODUODENOSCOPY (EGD) WITH PROPOFOL;  Surgeon: Lin Landsman, MD;  Location: [facility] ENDOSCOPY;  Service: Gastroenterology;  Laterality: N/A;  . EYE SURGERY    . HERNIA REPAIR     "navel"  . LEFT HEART CATH AND CORONARY ANGIOGRAPHY N/A 08/14/2016   Procedure: Left Heart Cath and Coronary Angiography;  Surgeon: [name], MD;  Location: [place] CV LAB;  Service: Cardiovascular;  Laterality: N/A;  . PRESSURE SENSOR/CARDIOMEMS N/A 09/23/2019   Procedure: PRESSURE SENSOR/CARDIOMEMS;  Surgeon: Jolaine Artist, MD;  Location: [place] CV LAB;  Service: Cardiovascular;  Laterality: N/A;  . RIGHT HEART CATH N/A 09/23/2019   Procedure: RIGHT HEART CATH;  Surgeon: Jolaine Artist, MD;  Location: [place] CV LAB;  Service: Cardiovascular;  Laterality: N/A;  . RIGHT/LEFT HEART CATH AND CORONARY ANGIOGRAPHY N/A 09/01/2018   Procedure: RIGHT/LEFT HEART CATH AND CORONARY ANGIOGRAPHY;  Surgeon: [name], MD;  Location: [place] CV LAB;  Service: Cardiovascular;  Laterality: N/A;  . RIGHT/LEFT HEART CATH AND CORONARY ANGIOGRAPHY N/A 04/13/2019   Procedure: RIGHT/LEFT HEART CATH AND CORONARY ANGIOGRAPHY;  Surgeon: [name], MD;  Location: [place] CV LAB;  Service: Cardiovascular;  Laterality: N/A;  . TOE AMPUTATION Right    "big toe"    There were no vitals filed for this visit.   Subjective Assessment - 06/22/20 1424    Subjective Pt reports no  pain today. Pt reports doing some HEP.    Patient is accompained by: Family member    Pertinent History Pt is an 87 y/o male presenting to physical therapy today with spouse with reports of difficulty with balance, dizziness, and walking. Pt states he started feeling "swimmy headed" approximately one year ago and that it is affecting his balance with walking and has been progressively worsening. Pt reports he feels he is moving when he's still. He denies spinning sensation. He reports he sometimes has headache but says "they are not that bad." Pt reports feeling SOB with walking and household tasks (his doctor is aware). Pt denies unintentional weight gain or loss. Pt reports 1 fall the last 6 months. Per spouse pt tripped at time of fall and do not provide further details. Pt with extensive PMH. Relevant PMH per chart: Exertional dyspnea, centrilobular emphysema, angina, CAD, acute on chronic HFrEF, controlled DMII with diabetic nephropathy, diabetic neuropathy,  gout, CKD, essential HTN, paroxysmal atrial fibrillation, chronic anticoagulation, CKD, fatigue, iron deficiency  anemia, pain of LE, OA of B knees, hx of partial ray amputation of R great toe, s/p cataract extraction status of eye, enthesopathy of ankle and tarsus, GERD, hyperlipidemia, hypothyroidism, nuclear sclerosis of L eye, psedophakia of R eye, ischemic cardiomyopathy, esophageal dysphagia, chronic heart failure with preserved ejection fraction, PSVT, coronary stent patent, bifascicular block, trifascicular block, wound infection, urge incontinence of urine.    Limitations House hold activities;Lifting;Standing;Walking;Writing   Pt reports when he tries to write his hand shakes a lot   How long can you sit comfortably? not limited    How long can you stand comfortably? 5 minutes    How long can you walk comfortably? 10 minutes    Diagnostic tests DG Chest 2 View  from 07/06/2019 per chart: "  IMPRESSION:  No acute abnormalities."    Patient  Stated Goals Feel more steady with walking    Currently in Pain? No/denies          TREATMENT   THEREX:  LAQ 3# AW 1x15; pt rates "easy," progressed to 5# AW 2x15; pt rates "medium," VC/demo for technique HR at end of exercise 73 BPM  Seated marches 5# AW 2x15, 1x16 pt rates "hard," VC/demo for technique  STS 1x8, 1x6, 1x7; pt rates "difficult," CGA provided HR with exercise 85 BPM  Neuro: CGA provided for all  Head impulse test: positive B with undershooting/corrective saccades  Seated twists with gaze fixed on red ball 12x  Seated vertical head turns with gaze fixed on red ball 1x12, 2x15, pt reports Dizziness with first set but not remaining sets  Standing, CGA, gaze fixed on red ball, WBOS, horizontal and vertical head turns 15x each  Standing CGA, gaze fixed on red ball, NBOS horizontal and vertical 15x each Pt reports some dizziness with vertical head turns  Pt standing, CGA-min assist, WBOS, gaze fixed on red ball CC/CW 10x each  Pt standing, CGA-min assist, NBOS gaze fixed on red ball CC/CW 10x each  SLB 2x30 sec BLEs, CGA and intermittent UE support  Education provided throughout in the form of demo, VC/TC to facilitate movement at target joints and correct muscle activation with exercises.    Assessment: Pt with positive B head impulsive testing, with undershooting followed by corrective saccades. Pt performed multiple standing and seated exercises with various head movements with gaze fixed on ball. Pt had most difficulty with vertical head turns and maintaining gaze on target with dizziness sx that resolved within a minute or rest. Pt also demonstrated decreased postural stability with posterior lean performing exercises with NBOS, requiring up to min assist from PT for pt to maintain balance. The pt will benefit from further skilled therapy to improve BLE strength and balance to increase safety with ADLs.     PT Short Term Goals - 06/07/20 9485      PT  SHORT TERM GOAL #1   Title Patient will be independent in home exercise program to improve strength/mobility for better functional independence with ADLs.    Baseline 06/06/2020: HEP to be initiated next visit    Time 6    Period Weeks    Status New    Target Date 07/18/20             PT Long Term Goals - 06/13/20 1713      PT LONG TERM GOAL #1   Title Patient will increase FOTO score to equal to or greater than 54 to demonstrate statistically significant improvement in mobility and quality of life.  Baseline 06/06/2020: 46 (risk-adjusted 45)    Time 12    Period Weeks    Status New    Target Date 08/29/20      PT LONG TERM GOAL #2   Title Patient will increase 10 meter walk test to >1.41m/s as to improve gait speed for better community ambulation and to reduce fall risk.    Baseline 06/06/2020: 0.59 m/s with SPC    Time 12    Period Weeks    Status New    Target Date 08/29/20      PT LONG TERM GOAL #3   Title Patient will increase Berg Balance score by > 6 points to demonstrate decreased fall risk during functional activities.    Baseline 06/06/2020: 40/56    Time 12    Period Weeks    Status New    Target Date 08/29/20      PT LONG TERM GOAL #4   Title Patient (> 62 years old) will complete five times sit to stand test in < 15 seconds indicating an increased LE strength and improved balance.    Baseline 06/06/2020: 18 sec performed with BUE assist off chair    Time 12    Period Weeks    Status New    Target Date 08/29/20      PT LONG TERM GOAL #5   Title Patient will reduce dizziness handicap inventory score by at least 18 points, for less dizziness with ADLs and increased safety with home and work tasks.    Baseline 06/06/2020: 48% (moderate handicap)    Time 12    Period Weeks    Status New    Target Date 08/29/20      Additional Long Term Goals   Additional Long Term Goals Yes      PT LONG TERM GOAL #6   Title Patient will increase Functional Gait Assessment  score to >20/30 as to reduce fall risk and improve dynamic gait safety with community ambulation.    Baseline 06/13/2020: 15/30    Time 11    Period Weeks    Status New    Target Date 08/29/20                 Plan - 06/22/20 1532    Clinical Impression Statement Pt with positive B head impulsive testing, with undershooting followed by corrective saccades. Pt performed multiple standing and seated exercises with various head movements with gaze fixed on ball. Pt had most difficulty with vertical head turns and maintaining gaze on target with dizziness sx that resolved within a minute or rest. Pt also demonstrated decreased postural stability with posterior lean performing exercises with NBOS, requiring up to min assist from PT for pt to maintain balance. The pt will benefit from further skilled therapy to improve BLE strength and balance to increase safety with ADLs.    Personal Factors and Comorbidities Age;Comorbidity 3+;Fitness;Comorbidity 1;Comorbidity 2;Past/Current Experience;Time since onset of injury/illness/exacerbation    Comorbidities Pertinent PMH/comorbidities per chart: Exertional dyspnea, centrilobular emphysema, angina, CAD, acute on chronic HFrEF, controlled DMII with diabetic nephropathy, diabetic neuropathy, gout, CKD, essential HTN, paroxysmal atrial fibrillation, chronic anticoagulation, CKD, fatigue, iron deficiency anemia, pain of LE, OA of B knees, hx of partial ray amputation of R great toe, s/p cataract extraction status of eye, enthesopathy of ankle and tarsus, GERD, hyperlipidemia, hypothyroidism, nuclear sclerosis of L eye, psedophakia of R eye, ischemic cardiomyopathy, esophageal dysphagia, chronic heart failure with preserved ejection fraction, PSVT, coronary stent patent, bifascicular  block, trifascicular block, wound infection, urge incontinence of urine.    Examination-Activity Limitations Bathing;Bed Mobility;Bend;Caring for  Others;Carry;Continence;Dressing;Hygiene/Grooming;Stairs;Squat;Reach Overhead;Locomotion Level;Lift;Stand;Toileting;Transfers    Examination-Participation Restrictions Medication Management;Cleaning;Meal Prep;Community Activity;Driving;Laundry;Shop;Yard Work;Volunteer    Stability/Clinical Decision Making Evolving/Moderate complexity    Rehab Potential Good    PT Frequency 2x / week    PT Duration 12 weeks    PT Treatment/Interventions ADLs/Self Care Home Management;Biofeedback;Canalith Repostioning;Cryotherapy;Electrical Stimulation;Moist Heat;Traction;Ultrasound;DME Instruction;Gait training;Stair training;Functional mobility training;Therapeutic activities;Therapeutic exercise;Balance training;Neuromuscular re-education;Patient/family education;Orthotic Fit/Training;Manual techniques;Wheelchair mobility training;Passive range of motion;Dry needling;Energy conservation;Taping;Vestibular;Joint Manipulations;Spinal Manipulations;Aquatic Therapy    PT Next Visit Plan initiate balance exercises, TUG, FGA, progress LE strengthening, foot clearance exercises, VORx1    Consulted and Agree with Plan of Care Patient;Family member/caregiver    Family Member Consulted spouse           Patient will benefit from skilled therapeutic intervention in order to improve the following deficits and impairments:  Abnormal gait,Dizziness,Increased fascial restricitons,Impaired sensation,Improper body mechanics,Pain,Cardiopulmonary status limiting activity,Decreased coordination,Decreased mobility,Postural dysfunction,Decreased activity tolerance,Decreased endurance,Decreased range of motion,Hypomobility,Decreased strength,Decreased balance,Difficulty walking,Impaired flexibility  Visit Diagnosis: Unsteadiness on feet  Muscle weakness (generalized)  Other abnormalities of gait and mobility     Problem List Patient Active Problem List   Diagnosis Date Noted  . Urge incontinence of urine 12/22/2019  .  Trifascicular block 10/20/2019  . Wound infection 10/08/2019  . Bifascicular block 05/13/2019  . Coronary stent patent 04/13/2019  . Chronic HFrEF (heart failure with reduced ejection fraction) ([place]) 03/19/2019  . PSVT (paroxysmal supraventricular tachycardia) ([place]) 03/19/2019  . 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 CKD (chronic kidney disease), stage IV ([place]) 06/04/2018  . Osteoarthritis of knees, bilateral 06/02/2018  . Hyperlipidemia associated with type 2 diabetes mellitus ([place]) 05/06/2018  . Pain of lower extremity 03/06/2018  . Duodenitis 01/30/2018  . Chronic heart failure with preserved ejection fraction (HFpEF) ([place]) 01/23/2018  . Hematochezia   . Melena   . Rectal bleeding 01/11/2018  . AVM (arteriovenous malformation) of colon without hemorrhage   . Schatzki's ring   . Esophageal dysphagia   . Diabetic retinopathy ([place]) 07/08/2017  . Ischemic cardiomyopathy 06/26/2017  . Cardiomyopathy ([place]) 06/26/2017  . Centrilobular emphysema ([place]) 06/13/2017  . Fatigue 12/18/2016  . Stable angina ([place]) 09/20/2016  . Stage 3a chronic kidney disease ([place]) 09/20/2016  . Paroxysmal atrial fibrillation ([place]) 08/28/2016  . Chronic anticoagulation 08/28/2016  . Essential hypertension 08/20/2016  . 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 heart with stable angina pectoris ([place]) 08/19/2016  . Accelerating angina ([place]) 08/15/2016  . Exertional dyspnea 08/12/2016  . 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[place]) 12/29/2015  . Gynecomastia, male 03/31/2014  . Hypertrophy of breast 03/31/2014  . Nuclear sclerosis of left eye 10/22/2013  . Enthesopathy of ankle and tarsus 04/10/2013  . Controlled type 2 diabetes mellitus with diabetic nephropathy ([place]) 04/09/2013  . Diabetic neuropathy associated with type 2 diabetes mellitus ([place]) 04/09/2013  . Cataract of left eye 11/25/2011  . Corneal opacity 09/30/2011  . Pseudophakia of right eye 09/30/2011  . GERD  (gastroesophageal reflux disease) 09/25/2011  . Gout 09/25/2011  . Hyperlipidemia LDL goal <70 09/25/2011  . Status post cataract extraction 04/11/2011  . Cataract extraction status of eye 04/11/2011  . Iron deficiency anemia 04/01/2011  . Benign localized hyperplasia of prostate without urinary obstruction and other lower urinary tract symptoms (LUTS) 12/10/2010  . Enlarged prostate without lower urinary tract symptoms (luts) 12/10/2010  . Colon cancer screening 10/11/2010  .  Hypothyroidism 10/11/2010  . Hyperlipidemia 09/20/2003  . Essential and other specified forms of tremor 01/05/2001  . Essential tremor 01/05/2001   Ricard Dillon PT, DPT 06/22/2020, 3:36 PM  [place] MAIN [facility] SERVICES [address] [place], [place], [address] Phone: [phone]   Fax:  [phone]  Name: Lucas Caldwell MRN: [id number] Date of Birth: 1933/11/22

## 2020-06-26 ENCOUNTER — Ambulatory Visit (INDEPENDENT_AMBULATORY_CARE_PROVIDER_SITE_OTHER): Payer: Medicare HMO | Admitting: General Practice

## 2020-06-26 ENCOUNTER — Telehealth: Payer: Medicare HMO | Admitting: General Practice

## 2020-06-26 DIAGNOSIS — I10 Essential (primary) hypertension: Secondary | ICD-10-CM

## 2020-06-26 DIAGNOSIS — E1121 Type 2 diabetes mellitus with diabetic nephropathy: Secondary | ICD-10-CM

## 2020-06-26 DIAGNOSIS — L603 Nail dystrophy: Secondary | ICD-10-CM | POA: Diagnosis not present

## 2020-06-26 DIAGNOSIS — I5032 Chronic diastolic (congestive) heart failure: Secondary | ICD-10-CM

## 2020-06-26 DIAGNOSIS — IMO0002 Reserved for concepts with insufficient information to code with codable children: Secondary | ICD-10-CM

## 2020-06-26 DIAGNOSIS — E1142 Type 2 diabetes mellitus with diabetic polyneuropathy: Secondary | ICD-10-CM | POA: Diagnosis not present

## 2020-06-26 DIAGNOSIS — L851 Acquired keratosis [keratoderma] palmaris et plantaris: Secondary | ICD-10-CM | POA: Diagnosis not present

## 2020-06-26 NOTE — Chronic Care Management (AMB) (Signed)
Chronic Care Management   CCM RN Visit Note  06/26/2020 Name: Lucas Caldwell MRN: [id number] DOB: May 07, 1933  Subjective: Lucas Caldwell is a 87 y.o. year old male who is a primary care patient of [name], DO. The care management team was consulted for assistance with disease management and care coordination needs.    Engaged with patient by telephone for follow up visit in response to provider referral for case management and/or care coordination services.   Consent to Services:  The patient was given information about Chronic Care Management services, agreed to services, and gave verbal consent prior to initiation of services.  Please see initial visit note for detailed documentation.   Patient agreed to services and verbal consent obtained.   Assessment: Review of patient past medical history, allergies, medications, health status, including review of consultants reports, laboratory and other test data, was performed as part of comprehensive evaluation and provision of chronic care management services.   SDOH (Social Determinants of Health) assessments and interventions performed:    CCM Care Plan  Allergies  Allergen Reactions  . Clopidogrel Anaphylaxis and Other (See Comments)    altered mental status;  (electrolytes "out of wack" and confusion per family; THEY DO NOT [place])- MSB 10/10/15  . Ciprofloxacin Itching  . Mirtazapine Diarrhea    Bad dreams   . Spironolactone Other (See Comments)    gynecomastia    Outpatient Encounter Medications as of 06/26/2020  Medication Sig Note  . ACCU-CHEK AVIVA PLUS test strip USE TO CHECK BLOOD SUGAR TWICE A DAY   . acetaminophen (TYLENOL) 500 MG tablet Take 1,000 mg by mouth every 6 (six) hours as needed for moderate pain or headache.    . Alcohol Swabs (B-D SINGLE USE SWABS REGULAR) PADS Use to check blood sugar up to 2 times daily   . allopurinol (ZYLOPRIM) 100 MG tablet Take 1 tablet (100  mg total) by mouth daily.   [name] aspirin 81 MG chewable tablet Chew 81 mg by mouth daily.   . Blood Glucose Monitoring Suppl (TRUE METRIX METER) DEVI Use to check blood sugar up to 2 times daily   . bosutinib (BOSULIF) 100 MG tablet Take 100 mg by mouth at bedtime as needed (tired/not feeling well). Take with food.    . Cholecalciferol 25 MCG (1000 UT) capsule Take by mouth.   [name] EASY TOUCH PEN NEEDLES 31G X 8 MM MISC USE 1 NEEDLE DAILY WITH LEVEMIR   . empagliflozin (JARDIANCE) 10 MG TABS tablet Take 1 tablet (10 mg total) by mouth daily before breakfast.   . ferrous sulfate 325 (65 FE) MG tablet Take by mouth.   . gabapentin (NEURONTIN) 300 MG capsule TAKE TWO CAPSULES BY MOUTH TWICE DAILY MAX DOSE = 1200MG DAILY FOR KIDNEY PROTECTION   . hydrALAZINE (APRESOLINE) 25 MG tablet Take 1 tablet (25 mg total) by mouth 3 (three) times daily.   . Insulin Glargine ([name] KWIKPEN) 100 UNIT/ML Inject 20 Units into the skin daily.   . Insulin Pen Needle 31G X 5 MM MISC 31 g by Does not apply route as directed.   . isosorbide mononitrate (IMDUR) 60 MG 24 hr tablet Take 1 tablet (60 mg total) by mouth daily.   [name] levothyroxine (SYNTHROID) 25 MCG tablet TAKE ONE TABLET BY MOUTH EVERY MORNING BEFORE BREAKFAST   . loperamide (IMODIUM A-D) 2 MG tablet Take by mouth.  (Patient not taking: Reported on 06/14/2020)   . losartan (COZAAR) 25 MG tablet  Take 1 tablet (25 mg total) by mouth daily.   . Menthol-Zinc Oxide (GOLD BOND EX) Apply 1 application topically daily as needed (muscle pain).   . metoprolol succinate (TOPROL-XL) 25 MG 24 hr tablet TAKE ONE TABLET BY MOUTH ONCE DAILY   . mirabegron ER (MYRBETRIQ) 25 MG TB24 tablet Take 1 tablet (25 mg total) by mouth daily. From Urology (Patient not taking: Reported on 06/14/2020)   . Multiple Vitamin (MULTIVITAMIN WITH MINERALS) TABS tablet Take 1 tablet by mouth daily.   . mupirocin ointment (BACTROBAN) 2 % Apply 1 application topically 2 (two) times daily. For up to 2  weeks then stop. May repeat in future as needed, can use to help prevent infection of toe.   . nitroGLYCERIN (NITROSTAT) 0.4 MG SL tablet Place 1 tablet (0.4 mg total) under the tongue every 5 (five) minutes x 3 doses as needed for chest pain.   [name] omeprazole (PRILOSEC) 20 MG capsule Take 20 mg by mouth in the morning.    [name] Place 1 drop into both eyes 3 (three) times daily as needed (dry/irritated eyes.).    [name] polyethylene glycol (MIRALAX / GLYCOLAX) 17 g packet Take 17 g by mouth daily as needed for mild constipation.    . potassium chloride (KLOR-CON) 10 MEQ tablet Take 1 tablet (10 mEq total) by mouth daily.   . ranolazine (RANEXA) 500 MG 12 hr tablet TAKE ONE TABLET BY MOUTH TWICE DAILY   . rosuvastatin (CRESTOR) 5 MG tablet TAKE ONE TABLET BY MOUTH ONCE DAILY   . simethicone (MYLICON) 80 MG chewable tablet Chew 80 mg by mouth every 6 (six) hours as needed for flatulence.   . tamsulosin (FLOMAX) 0.4 MG CAPS capsule Take 1 capsule (0.4 mg total) by mouth daily after supper.   . Tiotropium Bromide Monohydrate (SPIRIVA RESPIMAT) 1.25 MCG/ACT AERS Inhale into the lungs.   . torsemide (DEMADEX) 20 MG tablet Take 3 tablets (60 mg total) by mouth daily.   . traMADol (ULTRAM) 50 MG tablet Take 1-2 tablets (50-100 mg total) by mouth at bedtime as needed. (Patient not taking: Reported on 06/14/2020)   . TRULICITY 1.5 MP/5.3IR SOPN Inject 1.5 mg into the skin once a week. 02/04/2020: On Saturdays  . warfarin (COUMADIN) 3 MG tablet TAKE 1 TO 1 & 1/2 TABLETS BY MOUTH AS DIRECTED BY THE COUMADIN CLINIC    No facility-administered encounter medications on file as of 06/26/2020.    Patient Active Problem List   Diagnosis Date Noted  . Urge incontinence of urine 12/22/2019  . Trifascicular block 10/20/2019  . Wound infection 10/08/2019  . Bifascicular block 05/13/2019  . Coronary stent patent 04/13/2019  . Chronic HFrEF (heart failure with reduced ejection  fraction) ([place]) 03/19/2019  . PSVT (paroxysmal supraventricular tachycardia) ([place]) 03/19/2019  . 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 CKD (chronic kidney disease), stage IV ([place]) 06/04/2018  . Osteoarthritis of knees, bilateral 06/02/2018  . Hyperlipidemia associated with type 2 diabetes mellitus ([place]) 05/06/2018  . Pain of lower extremity 03/06/2018  . Duodenitis 01/30/2018  . Chronic heart failure with preserved ejection fraction (HFpEF) ([place]) 01/23/2018  . Hematochezia   . Melena   . Rectal bleeding 01/11/2018  . AVM (arteriovenous malformation) of colon without hemorrhage   . Schatzki's ring   . Esophageal dysphagia   . Diabetic retinopathy ([place]) 07/08/2017  . Ischemic cardiomyopathy 06/26/2017  . Cardiomyopathy ([place]) 06/26/2017  . Centrilobular emphysema ([place])  06/13/2017  . Fatigue 12/18/2016  . Stable angina ([place]) 09/20/2016  . Stage 3a chronic kidney disease ([place]) 09/20/2016  . Paroxysmal atrial fibrillation ([place]) 08/28/2016  . Chronic anticoagulation 08/28/2016  . Essential hypertension 08/20/2016  . 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 heart with stable angina pectoris ([place]) 08/19/2016  . Accelerating angina ([place]) 08/15/2016  . Exertional dyspnea 08/12/2016  . 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 Gynecomastia, male 03/31/2014  . Hypertrophy of breast 03/31/2014  . Nuclear sclerosis of left eye 10/22/2013  . Enthesopathy of ankle and tarsus 04/10/2013  . Controlled type 2 diabetes mellitus with diabetic nephropathy ([place]) 04/09/2013  . Diabetic neuropathy associated with type 2 diabetes mellitus ([place]) 04/09/2013  . Cataract of left eye 11/25/2011  . Corneal opacity 09/30/2011  . Pseudophakia of right eye 09/30/2011  . GERD (gastroesophageal reflux disease) 09/25/2011  . Gout 09/25/2011  . Hyperlipidemia LDL goal <70 09/25/2011  . Status post cataract extraction 04/11/2011  . Cataract extraction status of eye 04/11/2011   . Iron deficiency anemia 04/01/2011  . Benign localized hyperplasia of prostate without urinary obstruction and other lower urinary tract symptoms (LUTS) 12/10/2010  . Enlarged prostate without lower urinary tract symptoms (luts) 12/10/2010  . Colon cancer screening 10/11/2010  . Hypothyroidism 10/11/2010  . Hyperlipidemia 09/20/2003  . Essential and other specified forms of tremor 01/05/2001  . Essential tremor 01/05/2001    Conditions to be addressed/monitored:CHF, HTN and DMII  Care Plan : RNCM: Heart Failure (Adult)  Updates made by Vanita Ingles since 06/26/2020 12:00 AM    Problem: RNCM: Symptom Exacerbation (Heart Failure)   Priority: Medium    Goal: RNCM: Symptom Exacerbation Prevented or Minimized   Priority: Medium  Note:   Current Barriers:  [name] Knowledge deficits related to basic heart failure pathophysiology and self care management . Unable to independently manage HF . Unable to self administer medications as prescribed . Lacks social connections . Does not contact provider office for questions/concerns . Lack of scale in home- has scales and uses a mat supplied by cardiology clinic- weighs daily and is monitored daily by the clinic  . Financial strain  Occupational hygienist):   Over the next 120 days, patient will weigh self daily and record  Over the next 120 days, patient will verbalize understanding of Heart Failure Action Plan and when to call doctor  Over the next 120 days, patient will take all Heart Failure mediations as prescribed Interventions:  . Collaboration with [name], DO regarding development and update of comprehensive plan of care as evidenced by provider attestation and co-signature . Inter-disciplinary care team collaboration (see longitudinal plan of care) . Basic overview and discussion of pathophysiology of Heart Failure . Provided written and verbal education on low sodium diet- 05-01-2020: The patients wife  states he does not have a good appetite but he is adherent to heart healthy diet  . Reviewed Heart Failure Action Plan in depth and provided written copy. 06-26-2020: The patient continues to be monitored at home by using a device that he lays on daily and sends to the heart failure clinic. The patient has not had any recent exacerbations. Denies changes in his HF. Is monitored closely through the HF clinic and the device he lays on daily to send in daily weights.  . Assessed for scales in home . Discussed importance of daily weight- 06-26-2020: The patient is compliant with daily weights  . Reviewed role of diuretics in  prevention of fluid overload  Patient Goals/Self-Care Activities . Over the next 120 days, patient will:  - Take Heart Failure Medications as prescribed - Weigh daily and record (notify MD with 3 lb weight gain over night or 5 lb in a week) weight today was 201 - Follow CHF Action Plan - Adhere to low sodium diet - barriers to lifestyle changes reviewed and addressed - barriers to treatment reviewed and addressed - depression screen reviewed - healthy lifestyle promoted - medication-adherence assessment completed - rescue (action) plan developed - rescue (action) plan reviewed - self-awareness of signs/symptoms of worsening disease encouraged Follow Up Plan: Telephone follow up appointment with care management team member scheduled for: 09-04-2020 at 1145 am   Care Plan : RNCM: Hypertension (Adult)  Updates made by Vanita Ingles since 06/26/2020 12:00 AM    Problem: RNCM: Hypertension (Hypertension)   Priority: Medium    Long-Range Goal: RNCM: Hypertension Monitored   Priority: Medium  Note:   Objective:  . Last practice recorded BP readings:  . BP Readings from Last 3 Encounters: .  06/14/20 . (!) 118/56 .  05/19/20 . 132/67 .  04/18/20 . 122/62 .     [name] Most recent eGFR/CrCl: No results found for: EGFR  No components found for: CRCL Current Barriers:   [name] Knowledge Deficits related to basic understanding of hypertension pathophysiology and self care management . Knowledge Deficits related to understanding of medications prescribed for management of hypertension . Limited Social Support . Unable to independently manage HTN . Lacks social connections . Does not contact provider office for questions/concerns Case Manager Clinical Goal(s):  [name] Over the next 120 days, patient will verbalize understanding of plan for hypertension management . Over the next 120 days, patient will attend all scheduled medical appointments: sees cardiologist regularly , pcp on 06-14-2020- saw pcp, no upcoming appointments, knows to call for changes  . Over the next 120 days, patient will demonstrate improved adherence to prescribed treatment plan for hypertension as evidenced by taking all medications as prescribed, monitoring and recording blood pressure as directed, adhering to low sodium/DASH diet . Over the next 120 days, patient will demonstrate improved health management independence as evidenced by checking blood pressure as directed and notifying PCP if SBP>160 or DBP > 80, taking all medications as prescribe, and adhering to a low sodium diet as discussed. . Over the next 120 days, patient will verbalize basic understanding of hypertension disease process and self health management plan as evidenced by compliance with diet restrictions, compliance with medications, working with CCM team for optimal management of health and well being.  Interventions:  . Collaboration with [name], DO regarding development and update of comprehensive plan of care as evidenced by provider attestation and co-signature . Inter-disciplinary care team collaboration (see longitudinal plan of care) . UNABLE to independently:manage HTN . Evaluation of current treatment plan related to hypertension self management and patient's adherence to plan as established by provider.  06-26-2020: The patient saw the pcp recently. The patient is doing well. Managing chronic conditions well. No changes in the plan of care. Will continue to monitor.  . Provided education to patient re: stroke prevention, s/s of heart attack and stroke, DASH diet, complications of uncontrolled blood pressure . Reviewed medications with patient and discussed importance of compliance. 06-26-2020: Followed closely by pham D and also verbalized compliance  . Discussed plans with patient for ongoing care management follow up and provided patient with direct contact information for care management  team . Advised patient, providing education and rationale, to monitor blood pressure daily and record, calling PCP for findings outside established parameters.  . Reviewed scheduled/upcoming provider appointments including: saw pcp 06-14-2020, no upcoming appointments. Knows to call for changes. Patient Goals/Self-Care Activities . Over the next 120 days, patient will:  - Self administers medications as prescribed Attends all scheduled provider appointments Calls provider office for new concerns, questions, or BP outside discussed parameters Checks BP and records as discussed Follows a low sodium diet/DASH diet - blood pressure trends reviewed - depression screen reviewed - home or ambulatory blood pressure monitoring encouraged Follow Up Plan: Telephone follow up appointment with care management team member scheduled for: 09-04-2020 at 11:45 am    Care Plan : RNCM: Diabetes Type 2 (Adult)  Updates made by Vanita Ingles since 06/26/2020 12:00 AM    Problem: RNCM: Glycemic Management (Diabetes, Type 2)   Priority: Medium    Long-Range Goal: RNCM: Glycemic Management Optimized   Priority: Medium  Note:   Objective:  . Lab Results .  Component . Value . Date .   [name] HGBA1C . 7.1 (A) . 06/14/2020 .    [name] Lab Results .  Component . Value . Date .   [name] CREATININE . 1.72 (H) . 04/13/2020 .   [name] BUN . 27 (H)  . 04/13/2020 .   [name] NA . 137 . 04/13/2020 .   . K . 4.3 . 04/13/2020 .   . CL . 102 . 04/13/2020 .   [name] CO2 . 25 . 04/13/2020 .    [name] No results found for: EGFR Current Barriers:  [name] Knowledge Deficits related to basic Diabetes pathophysiology and self care/management . Knowledge Deficits related to medications used for management of diabetes . Difficulty obtaining or cannot afford medications . Limited Social Support . Unable to self administer medications as prescribed . Lacks social connections . Does not contact provider office for questions/concerns Case Manager Clinical Goal(s):  [name] Collaboration with [name], DO regarding development and update of comprehensive plan of care as evidenced by provider attestation and co-signature . Inter-disciplinary care team collaboration (see longitudinal plan of care) . Over the next 120 days, patient will demonstrate improved adherence to prescribed treatment plan for diabetes self care/management as evidenced by:  . daily monitoring and recording of CBG- 06-26-2020: Per the patients wife his blood sugars are 120-156 and staying under 200 . adherence to ADA/ carb modified diet . adherence to prescribed medication regimen Interventions:  . Provided education to patient about basic DM disease process . Reviewed medications with patient and discussed importance of medication adherence. 06-26-2020: The patient is compliant with the current medications regimen. Working with pharm D on a consistent basis.  . Discussed plans with patient for ongoing care management follow up and provided patient with direct contact information for care management team . Provided patient with written educational materials related to hypo and hyperglycemia and importance of correct treatment . Reviewed scheduled/upcoming provider appointments including:  saw pcp 06-14-2020, no upcoming appointments but will call for changes. Sees podiatrist today, 06-26-2020 and is  also working with PT for strengthening and balance.  . Advised patient, providing education and rationale, to check cbg bid  and record, calling pcp for findings outside established parameters.  06-26-2020: The patient is compliant with blood sugar checks. Praised for lower A1C. His wife helps manage his care.  . Review of patient status, including review of consultants reports, relevant laboratory and other  test results, and medications completed. . Evaluation of diabetic shoes. The patient has his diabetic shoes and they are working well for him. He has an appointment today to see the podiatrist about his big toe on his right foot. [name] being followed closely. Education and support given. Will continue to monitor.  Patient Goals/Self-Care Activities . Over the next 120 days, patient will:  - Self administers oral medications as prescribed Self administers insulin as prescribed Attends all scheduled provider appointments Checks blood sugars as prescribed and utilize hyper and hypoglycemia protocol as needed Adheres to prescribed ADA/carb modified - barriers to adherence to treatment plan identified - blood glucose monitoring encouraged - blood glucose readings reviewed - individualized medical nutrition therapy provided - resources required to improve adherence to care identified - self-awareness of signs/symptoms of hypo or hyperglycemia encouraged - use of blood glucose monitoring log promoted Follow Up Plan: Telephone follow up appointment with care management team member scheduled for: 09-04-2020 at 1145 am      Plan:Telephone follow up appointment with care management team member scheduled for:  09-04-2020 at 1145 am  Noreene Larsson RN, MSN, [place] Mobile: [phone]

## 2020-06-27 ENCOUNTER — Ambulatory Visit: Payer: Medicare HMO

## 2020-06-27 ENCOUNTER — Other Ambulatory Visit: Payer: Self-pay

## 2020-06-27 DIAGNOSIS — R278 Other lack of coordination: Secondary | ICD-10-CM | POA: Diagnosis not present

## 2020-06-27 DIAGNOSIS — M6281 Muscle weakness (generalized): Secondary | ICD-10-CM

## 2020-06-27 DIAGNOSIS — R2681 Unsteadiness on feet: Secondary | ICD-10-CM

## 2020-06-27 DIAGNOSIS — R2689 Other abnormalities of gait and mobility: Secondary | ICD-10-CM | POA: Diagnosis not present

## 2020-06-27 NOTE — Therapy (Signed)
[place] MAIN [facility] SERVICES [address] [place], [place], [address] Phone: [phone]   Fax:  [phone]  Physical Therapy Treatment  Patient Details  Name: Lucas Caldwell MRN: [id number] Date of Birth: 1934-01-19 Referring Provider (PT): [name], DO   Encounter Date: 06/27/2020   PT End of Session - 06/27/20 1539    Visit Number 5    Number of Visits 25    Date for PT Re-Evaluation 08/29/20    Authorization Time Period eval performed on 06/06/2020    PT Start Time 1434    PT Stop Time 1520    PT Time Calculation (min) 46 min    Equipment Utilized During Treatment Gait belt    Activity Tolerance Patient tolerated treatment well    Behavior During Therapy [facility] for tasks assessed/performed           Past Medical History:  Diagnosis Date  . Arthritis    "probably in his legs" (08/15/2016)  . Blind right eye   . BPH (benign prostatic hyperplasia)   . Breast asymmetry    Left breast is larger, present for several years.  [name] CAD (coronary artery disease)    a. Cath in the late 90's - reportedly ok;  b. 2014 s/p stenting x 2 @ [facility]; c 08/19/16 Cath/PCI with DES -> RCA, plan to treat LM 70% medically. Seen by surgery and felt to be too high risk for CABG; d. 08/2018 Cath: LM 70 (iFR 0.86), LAD 20ost, 40p, 50/55m, D1 70, LCX patent stent, OM1 30, RCA patent stent, 32m ISR, 40d, RPDA 30. PCWP 8. CO/CI 4.0/2.1; e. 03/2019 PCI to LAD (2.5x15 Resolute Onyx DES).  . Chronic combined systolic (congestive) and diastolic (congestive) heart failure ([place])    a. Previously reduced EF-->50% by echo in 2012;  b. 06/2015 Echo: EF 50-55%  c. 07/2016 Echo: EF 45-50%; d. 12/2017 Echo: EF 55%, Gr1 DD, mild MR; e. 08/2018 Echo: EF 35-40%, mildly dil PA; f. 04/2019 Echo: EF 30-35%, glob HK, nl RV fxn; g. s/p Cardiomems.  . Chronic kidney disease (CKD), stage IV (severe) ([place])   . CML (chronic myelocytic leukemia) ([place])   . GERD (gastroesophageal reflux  disease)   . GIB (gastrointestinal bleeding)    a. 12/2017 3 unit PRBC GIB in setting of coumadin-->Endo/colon multiple duodenal ulcers and a single bleeding ulcer in the proximal ascending colon status post hemostatic clipping x2.  . Gout   . Hyperlipemia   . Hypertension   . Hypothyroidism   . Iron deficiency anemia   . Ischemic cardiomyopathy    a. Previously reduced EF-->50% by echo in 2012;  b. 06/2015 Echo: EF 50-55%, Gr2 DD, mild MR, mildly dil LA, Ao sclerosis, mild TR;  c. 07/2016 Echo: EF 45-50%; d. 12/2017 Echo: EF 55%, Gr1 DD, mild MR; e. 08/2018 Echo: EF 35-40%; f. 04/2019 Echo: EF 30-35%.  . Migraine    "in the 1960s" (08/15/2016)  . PAF (paroxysmal atrial fibrillation) (HCC)    a. ? Dx 2014-->s/p DCCV;  b. CHA2DS2VASc = 6--> Coumadin.  . Prostate cancer ([place])   . RBBB   . Type II diabetes mellitus ([place])     Past Surgical History:  Procedure Laterality Date  . CATARACT EXTRACTION W/ INTRAOCULAR LENS  IMPLANT, BILATERAL Bilateral   . COLONOSCOPY N/A 01/13/2018   Procedure: COLONOSCOPY;  Surgeon: Lin Landsman, MD;  Location[facility] ENDOSCOPY;  Service: Gastroenterology;  Laterality: N/A;  . COLONOSCOPY WITH PROPOFOL N/A 01/01/2018  Procedure: COLONOSCOPY WITH PROPOFOL;  Surgeon: Lin Landsman, MD;  Location: [facility] ENDOSCOPY;  Service: Gastroenterology;  Laterality: N/A;  . CORONARY ANGIOPLASTY WITH STENT PLACEMENT  09/2012   2 stents  . CORONARY STENT INTERVENTION N/A 08/19/2016   Procedure: Coronary Stent Intervention;  Surgeon: [name], MD;  Location: [place] CV LAB;  Service: Cardiovascular;  Laterality: N/A;  . CORONARY STENT INTERVENTION N/A 04/13/2019   Procedure: CORONARY STENT INTERVENTION;  Surgeon: [name], MD;  Location: [place] CV LAB;  Service: Cardiovascular;  Laterality: N/A;  . ESOPHAGEAL MANOMETRY N/A 12/16/2017   Procedure: ESOPHAGEAL MANOMETRY (EM);  Surgeon: Lin Landsman, MD;  Location: [facility] ENDOSCOPY;  Service:  Gastroenterology;  Laterality: N/A;  . ESOPHAGOGASTRODUODENOSCOPY N/A 12/20/2016   Procedure: ESOPHAGOGASTRODUODENOSCOPY (EGD);  Surgeon: Lin Landsman, MD;  Location: [facility] ENDOSCOPY;  Service: Gastroenterology;  Laterality: N/A;  . ESOPHAGOGASTRODUODENOSCOPY N/A 01/13/2018   Procedure: ESOPHAGOGASTRODUODENOSCOPY (EGD);  Surgeon: Lin Landsman, MD;  Location: [name]COPY;  Service: Gastroenterology;  Laterality: N/A;  . ESOPHAGOGASTRODUODENOSCOPY (EGD) WITH PROPOFOL N/A 11/03/2017   Procedure: ESOPHAGOGASTRODUODENOSCOPY (EGD) WITH PROPOFOL;  Surgeon: Lin Landsman, MD;  Location: [facility] ENDOSCOPY;  Service: Gastroenterology;  Laterality: N/A;  . EYE SURGERY    . HERNIA REPAIR     "navel"  . LEFT HEART CATH AND CORONARY ANGIOGRAPHY N/A 08/14/2016   Procedure: Left Heart Cath and Coronary Angiography;  Surgeon: [name], MD;  Location: [place] CV LAB;  Service: Cardiovascular;  Laterality: N/A;  . PRESSURE SENSOR/CARDIOMEMS N/A 09/23/2019   Procedure: PRESSURE SENSOR/CARDIOMEMS;  Surgeon: Jolaine Artist, MD;  Location: [place] CV LAB;  Service: Cardiovascular;  Laterality: N/A;  . RIGHT HEART CATH N/A 09/23/2019   Procedure: RIGHT HEART CATH;  Surgeon: Jolaine Artist, MD;  Location: [place] CV LAB;  Service: Cardiovascular;  Laterality: N/A;  . RIGHT/LEFT HEART CATH AND CORONARY ANGIOGRAPHY N/A 09/01/2018   Procedure: RIGHT/LEFT HEART CATH AND CORONARY ANGIOGRAPHY;  Surgeon: [name], MD;  Location: [place] CV LAB;  Service: Cardiovascular;  Laterality: N/A;  . RIGHT/LEFT HEART CATH AND CORONARY ANGIOGRAPHY N/A 04/13/2019   Procedure: RIGHT/LEFT HEART CATH AND CORONARY ANGIOGRAPHY;  Surgeon: [name], MD;  Location: [place] CV LAB;  Service: Cardiovascular;  Laterality: N/A;  . TOE AMPUTATION Right    "big toe"    There were no vitals filed for this visit.   Subjective Assessment - 06/27/20 1437    Subjective Pt denies any  falls or near falls. Pt denies pain. Pt reports doing some of his HEP.    Patient is accompained by: Family member    Pertinent History Pt is an 87 y/o male presenting to physical therapy today with spouse with reports of difficulty with balance, dizziness, and walking. Pt states he started feeling "swimmy headed" approximately one year ago and that it is affecting his balance with walking and has been progressively worsening. Pt reports he feels he is moving when he's still. He denies spinning sensation. He reports he sometimes has headache but says "they are not that bad." Pt reports feeling SOB with walking and household tasks (his doctor is aware). Pt denies unintentional weight gain or loss. Pt reports 1 fall the last 6 months. Per spouse pt tripped at time of fall and do not provide further details. Pt with extensive PMH. Relevant PMH per chart: Exertional dyspnea, centrilobular emphysema, angina, CAD, acute on chronic HFrEF, controlled DMII with diabetic nephropathy, diabetic neuropathy,  gout, CKD, essential HTN, paroxysmal atrial  fibrillation, chronic anticoagulation, CKD, fatigue, iron deficiency anemia, pain of LE, OA of B knees, hx of partial ray amputation of R great toe, s/p cataract extraction status of eye, enthesopathy of ankle and tarsus, GERD, hyperlipidemia, hypothyroidism, nuclear sclerosis of L eye, psedophakia of R eye, ischemic cardiomyopathy, esophageal dysphagia, chronic heart failure with preserved ejection fraction, PSVT, coronary stent patent, bifascicular block, trifascicular block, wound infection, urge incontinence of urine.    Limitations House hold activities;Lifting;Standing;Walking;Writing   Pt reports when he tries to write his hand shakes a lot   How long can you sit comfortably? not limited    How long can you stand comfortably? 5 minutes    How long can you walk comfortably? 10 minutes    Diagnostic tests DG Chest 2 View  from 07/06/2019 per chart: "  IMPRESSION:  No  acute abnormalities."    Patient Stated Goals Feel more steady with walking              TREATMENT   Neuro Re-ed: CGA for all exercises  Seated VORx1 4x30 sec to train for L eye; pt reports minor dizziness sx with first two sets of exercise  Seated gaze stabilization with focus on ball CW/CC - 10x each direction Standing gaze stabilization with focus on ball CW/CC - 10x each direction   Obstacle course stepping over objects and around cones x multiple reps; pt multiple instances of knocking into cones and difficulty clearing objects, requiring up to min assist to regain balance d/t LOB    Therex:  Standing at support surface, alternating toe taps onto 6" step with 2.5# AW 3x16, 1x8; pt multiple instances of difficulty clearing step  At support surface, step ups onto 6" step with 2.5# AW 10x   Seated dorsiflexion with 2.5# AW 2x20; pt rates medium  Seated heel raises with 2.5# AW 2x20  Education provided throughout in the form of demo, VC/TC to facilitate movement at target joints and correct muscle activation with exercises.       PT Short Term Goals - 06/07/20 2229      PT SHORT TERM GOAL #1   Title Patient will be independent in home exercise program to improve strength/mobility for better functional independence with ADLs.    Baseline 06/06/2020: HEP to be initiated next visit    Time 6    Period Weeks    Status New    Target Date 07/18/20             PT Long Term Goals - 06/13/20 1713      PT LONG TERM GOAL #1   Title Patient will increase FOTO score to equal to or greater than 54 to demonstrate statistically significant improvement in mobility and quality of life.    Baseline 06/06/2020: 46 (risk-adjusted 45)    Time 12    Period Weeks    Status New    Target Date 08/29/20      PT LONG TERM GOAL #2   Title Patient will increase 10 meter walk test to >1.23m/s as to improve gait speed for better community ambulation and to reduce fall risk.    Baseline  06/06/2020: 0.59 m/s with SPC    Time 12    Period Weeks    Status New    Target Date 08/29/20      PT LONG TERM GOAL #3   Title Patient will increase Berg Balance score by > 6 points to demonstrate decreased fall risk during functional activities.  Baseline 06/06/2020: 40/56    Time 12    Period Weeks    Status New    Target Date 08/29/20      PT LONG TERM GOAL #4   Title Patient (> 33 years old) will complete five times sit to stand test in < 15 seconds indicating an increased LE strength and improved balance.    Baseline 06/06/2020: 18 sec performed with BUE assist off chair    Time 12    Period Weeks    Status New    Target Date 08/29/20      PT LONG TERM GOAL #5   Title Patient will reduce dizziness handicap inventory score by at least 18 points, for less dizziness with ADLs and increased safety with home and work tasks.    Baseline 06/06/2020: 48% (moderate handicap)    Time 12    Period Weeks    Status New    Target Date 08/29/20      Additional Long Term Goals   Additional Long Term Goals Yes      PT LONG TERM GOAL #6   Title Patient will increase Functional Gait Assessment score to >20/30 as to reduce fall risk and improve dynamic gait safety with community ambulation.    Baseline 06/13/2020: 15/30    Time 11    Period Weeks    Status New    Target Date 08/29/20                 Plan - 06/27/20 1534    Clinical Impression Statement Pt performed seated VORx1 with focus on L eye. Pt did report minor dizziness with first two sets that resolved after a couple minutes. Pt with multiple instances of knocking into cones and inability to clear steps during obstacle course, requiring up to min assist for pt to regain balance after LOB when tripping over obstacles. Following the obstacle course, pt performed multiple reps of alternating toe taps and dorsiflexion exercises to strengthen target muscles to help in clearing obstacles with gait. The pt will benefit from  further skilled therapy to improve balance and BLE strength to decrease risk of falls.    Personal Factors and Comorbidities Age;Comorbidity 3+;Fitness;Comorbidity 1;Comorbidity 2;Past/Current Experience;Time since onset of injury/illness/exacerbation    Comorbidities Pertinent PMH/comorbidities per chart: Exertional dyspnea, centrilobular emphysema, angina, CAD, acute on chronic HFrEF, controlled DMII with diabetic nephropathy, diabetic neuropathy, gout, CKD, essential HTN, paroxysmal atrial fibrillation, chronic anticoagulation, CKD, fatigue, iron deficiency anemia, pain of LE, OA of B knees, hx of partial ray amputation of R great toe, s/p cataract extraction status of eye, enthesopathy of ankle and tarsus, GERD, hyperlipidemia, hypothyroidism, nuclear sclerosis of L eye, psedophakia of R eye, ischemic cardiomyopathy, esophageal dysphagia, chronic heart failure with preserved ejection fraction, PSVT, coronary stent patent, bifascicular block, trifascicular block, wound infection, urge incontinence of urine.    Examination-Activity Limitations Bathing;Bed Mobility;Bend;Caring for Others;Carry;Continence;Dressing;Hygiene/Grooming;Stairs;Squat;Reach Overhead;Locomotion Level;Lift;Stand;Toileting;Transfers    Examination-Participation Restrictions Medication Management;Cleaning;Meal Prep;Community Activity;Driving;Laundry;Shop;Yard Work;Volunteer    Stability/Clinical Decision Making Evolving/Moderate complexity    Rehab Potential Good    PT Frequency 2x / week    PT Duration 12 weeks    PT Treatment/Interventions ADLs/Self Care Home Management;Biofeedback;Canalith Repostioning;Cryotherapy;Electrical Stimulation;Moist Heat;Traction;Ultrasound;DME Instruction;Gait training;Stair training;Functional mobility training;Therapeutic activities;Therapeutic exercise;Balance training;Neuromuscular re-education;Patient/family education;Orthotic Fit/Training;Manual techniques;Wheelchair mobility training;Passive range  of motion;Dry needling;Energy conservation;Taping;Vestibular;Joint Manipulations;Spinal Manipulations;Aquatic Therapy    PT Next Visit Plan initiate balance exercises, TUG, FGA, progress LE strengthening, foot clearance exercises, VORx1, hip abudction/extension    Consulted and Agree  with Plan of Care Patient;Family member/caregiver    Family Member Consulted spouse           Patient will benefit from skilled therapeutic intervention in order to improve the following deficits and impairments:  Abnormal gait,Dizziness,Increased fascial restricitons,Impaired sensation,Improper body mechanics,Pain,Cardiopulmonary status limiting activity,Decreased coordination,Decreased mobility,Postural dysfunction,Decreased activity tolerance,Decreased endurance,Decreased range of motion,Hypomobility,Decreased strength,Decreased balance,Difficulty walking,Impaired flexibility  Visit Diagnosis: Unsteadiness on feet  Muscle weakness (generalized)  Other abnormalities of gait and mobility     Problem List Patient Active Problem List   Diagnosis Date Noted  . Urge incontinence of urine 12/22/2019  . Trifascicular block 10/20/2019  . Wound infection 10/08/2019  . Bifascicular block 05/13/2019  . Coronary stent patent 04/13/2019  . Chronic HFrEF (heart failure with reduced ejection fraction) ([place]) 03/19/2019  . PSVT (paroxysmal supraventricular tachycardia) ([place]) 03/19/2019  . 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 ([place]) 08/24/2018  . CKD (chronic kidney disease), stage IV ([place]) 06/04/2018  . Osteoarthritis of knees, bilateral 06/02/2018  . Hyperlipidemia associated with type 2 diabetes mellitus ([place]) 05/06/2018  . Pain of lower extremity 03/06/2018  . Duodenitis 01/30/2018  . Chronic heart failure with preserved ejection fraction (HFpEF) ([place]) 01/23/2018  . Hematochezia   . Melena   . Rectal bleeding 01/11/2018  . AVM (arteriovenous malformation) of colon without hemorrhage   .  Schatzki's ring   . Esophageal dysphagia   . Diabetic retinopathy ([place]) 07/08/2017  . Ischemic cardiomyopathy 06/26/2017  . Cardiomyopathy ([place]) 06/26/2017  . Centrilobular emphysema ([place]) 06/13/2017  . Fatigue 12/18/2016  . Stable angina ([place]) 09/20/2016  . Stage 3a chronic kidney disease ([place]) 09/20/2016  . Paroxysmal atrial fibrillation ([place]) 08/28/2016  . Chronic anticoagulation 08/28/2016  . Essential hypertension 08/20/2016  . 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 heart with stable angina pectoris ([place]) 08/19/2016  . Accelerating angina ([place]) 08/15/2016  . Exertional dyspnea 08/12/2016  . 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[place]) 12/29/2015  . Gynecomastia, male 03/31/2014  . Hypertrophy of breast 03/31/2014  . Nuclear sclerosis of left eye 10/22/2013  . Enthesopathy of ankle and tarsus 04/10/2013  . Controlled type 2 diabetes mellitus with diabetic nephropathy ([place]) 04/09/2013  . Diabetic neuropathy associated with type 2 diabetes mellitus ([place]) 04/09/2013  . Cataract of left eye 11/25/2011  . Corneal opacity 09/30/2011  . Pseudophakia of right eye 09/30/2011  . GERD (gastroesophageal reflux disease) 09/25/2011  . Gout 09/25/2011  . Hyperlipidemia LDL goal <70 09/25/2011  . Status post cataract extraction 04/11/2011  . Cataract extraction status of eye 04/11/2011  . Iron deficiency anemia 04/01/2011  . Benign localized hyperplasia of prostate without urinary obstruction and other lower urinary tract symptoms (LUTS) 12/10/2010  . Enlarged prostate without lower urinary tract symptoms (luts) 12/10/2010  . Colon cancer screening 10/11/2010  . Hypothyroidism 10/11/2010  . Hyperlipidemia 09/20/2003  . Essential and other specified forms of tremor 01/05/2001  . Essential tremor 01/05/2001   Ricard Dillon PT, DPT 06/27/2020, 3:42 PM  [place] MAIN [facility] SERVICES [address] [place], [place], [address] Phone: [phone]   Fax:   [phone]  Name: Lucas Caldwell MRN: [id number] Date of Birth: 26-Apr-1933

## 2020-06-28 ENCOUNTER — Ambulatory Visit (INDEPENDENT_AMBULATORY_CARE_PROVIDER_SITE_OTHER): Payer: Medicare HMO

## 2020-06-28 ENCOUNTER — Other Ambulatory Visit: Payer: Self-pay

## 2020-06-28 DIAGNOSIS — I480 Paroxysmal atrial fibrillation: Secondary | ICD-10-CM | POA: Diagnosis not present

## 2020-06-28 DIAGNOSIS — Z7901 Long term (current) use of anticoagulants: Secondary | ICD-10-CM

## 2020-06-28 LAB — POCT INR: INR: 1.7 — AB (ref 2.0–3.0)

## 2020-06-28 NOTE — Patient Instructions (Signed)
-   take 2 tablets tonight, then  - START NEW DOSAGE of warfarin of 1.5 TABLETS EVERY DAY EXCEPT 1 TABLETS ON MONDAYS, [place]. - recheck INR in 6 weeks.

## 2020-06-29 ENCOUNTER — Encounter: Payer: Self-pay | Admitting: Internal Medicine

## 2020-06-29 ENCOUNTER — Ambulatory Visit: Payer: Medicare HMO

## 2020-06-29 ENCOUNTER — Ambulatory Visit (INDEPENDENT_AMBULATORY_CARE_PROVIDER_SITE_OTHER): Payer: Medicare HMO | Admitting: Internal Medicine

## 2020-06-29 VITALS — BP 140/56 | HR 75 | Ht 64.0 in | Wt 198.0 lb

## 2020-06-29 DIAGNOSIS — I25118 Atherosclerotic heart disease of native coronary artery with other forms of angina pectoris: Secondary | ICD-10-CM | POA: Diagnosis not present

## 2020-06-29 DIAGNOSIS — E785 Hyperlipidemia, unspecified: Secondary | ICD-10-CM | POA: Diagnosis not present

## 2020-06-29 DIAGNOSIS — R2681 Unsteadiness on feet: Secondary | ICD-10-CM | POA: Diagnosis not present

## 2020-06-29 DIAGNOSIS — R2689 Other abnormalities of gait and mobility: Secondary | ICD-10-CM

## 2020-06-29 DIAGNOSIS — R278 Other lack of coordination: Secondary | ICD-10-CM | POA: Diagnosis not present

## 2020-06-29 DIAGNOSIS — M6281 Muscle weakness (generalized): Secondary | ICD-10-CM

## 2020-06-29 DIAGNOSIS — G4733 Obstructive sleep apnea (adult) (pediatric): Secondary | ICD-10-CM | POA: Diagnosis not present

## 2020-06-29 DIAGNOSIS — I5032 Chronic diastolic (congestive) heart failure: Secondary | ICD-10-CM | POA: Diagnosis not present

## 2020-06-29 DIAGNOSIS — I480 Paroxysmal atrial fibrillation: Secondary | ICD-10-CM | POA: Diagnosis not present

## 2020-06-29 DIAGNOSIS — I10 Essential (primary) hypertension: Secondary | ICD-10-CM

## 2020-06-29 DIAGNOSIS — I5022 Chronic systolic (congestive) heart failure: Secondary | ICD-10-CM | POA: Diagnosis not present

## 2020-06-29 DIAGNOSIS — E1169 Type 2 diabetes mellitus with other specified complication: Secondary | ICD-10-CM

## 2020-06-29 NOTE — Patient Instructions (Signed)
Medication Instructions:  Your physician recommends that you continue on your current medications as directed. Please refer to the Current Medication list given to you today.  *If you need a refill on your cardiac medications before your next appointment, please call your pharmacy*   Lab Work: Bmp, Lipid, Cbc today If you have labs (blood work) drawn today and your tests are completely normal, you will receive your results only by: [name] MyChart Message (if you have MyChart) OR . A paper copy in the mail If you have any lab test that is abnormal or we need to change your treatment, we will call you to review the results.   Testing/Procedures: None ordered   Follow-Up: At [facility], you and your health needs are our priority.  As part of our continuing mission to provide you with exceptional heart care, we have created designated Provider Care Teams.  These Care Teams include your primary Cardiologist (physician) and Advanced Practice Providers (APPs -  Physician Assistants and Nurse Practitioners) who all work together to provide you with the care you need, when you need it.  We recommend signing up for the patient portal called "MyChart".  Sign up information is provided on this After Visit Summary.  MyChart is used to connect with patients for Virtual Visits (Telemedicine).  Patients are able to view lab/test results, encounter notes, upcoming appointments, etc.  Non-urgent messages can be sent to your provider as well.   To learn more about what you can do with MyChart, go to [email or web address].    Your next appointment:   3 month(s)  The format for your next appointment:   In Person  Provider:   You may see [name], MD or one of the following Advanced Practice Providers on your designated Care Team:    [name], NP  [name], PA-C  [name], PA-C  Cadence [place], [place]  Laurann [place], NP    Other Instructions N/A

## 2020-06-29 NOTE — Progress Notes (Signed)
Follow-up Outpatient Visit Date: 06/29/2020  Primary Care Provider: [name], DO [address] [place] [address]  Chief Complaint: Follow-up CAD and HFpEF  HPI:  Mr. Champlain is a 87 y.o. male with history of coronary artery disease,chronic systolic heart failure due to likelymixed ischemic and nonischemic cardiomyopathy status post CardioMEMS followed in our advanced heart failure clinic, obstructive sleep apnea, hypertension, hyperlipidemia, type 2 diabetes mellitus, paroxysmal atrial fibrillation, chronic kidney disease stage III, CML, iron deficiency anemia,GI bleed due to duodenal ulcers,BPH, and hypothyroidism, who presents for follow-up of coronary artery disease, heart failure, and atrial fibrillation.  He was last seen in our office in early February by [name], PA, at which time he complained of a several month history of generalized weakness and positional dizziness.  He was advised to decrease losartan to 25 mg daily.  He was recently diagnosed with sleep apnea but has refused to undergo CPAP titration in [place].  Today, Mr. Ells reports that he has been feeling quite well, noticeably better than 6 months ago.  He still has some exertional dyspnea but feels like it is getting better.  His balance and dizziness have improved with physical therapy, which he continues.  He denies chest pain, palpitations, lightheadedness, and edema.  He denies bleeding, remaining compliant with warfarin and apixaban.  --------------------------------------------------------------------------------------------------  Past Medical History:  Diagnosis Date  . Arthritis    "probably in his legs" (08/15/2016)  . Blind right eye   . BPH (benign prostatic hyperplasia)   . Breast asymmetry    Left breast is larger, present for several years.  [name] CAD (coronary artery disease)    a. Cath in the late 90's - reportedly ok;  b. 2014 s/p stenting x 2 @ [facility]; c 08/19/16 Cath/PCI with DES  -> RCA, plan to treat LM 70% medically. Seen by surgery and felt to be too high risk for CABG; d. 08/2018 Cath: LM 70 (iFR 0.86), LAD 20ost, 40p, 50/25m, D1 70, LCX patent stent, OM1 30, RCA patent stent, 18m ISR, 40d, RPDA 30. PCWP 8. CO/CI 4.0/2.1; e. 03/2019 PCI to LAD (2.5x15 Resolute Onyx DES).  . Chronic combined systolic (congestive) and diastolic (congestive) heart failure ([place])    a. Previously reduced EF-->50% by echo in 2012;  b. 06/2015 Echo: EF 50-55%  c. 07/2016 Echo: EF 45-50%; d. 12/2017 Echo: EF 55%, Gr1 DD, mild MR; e. 08/2018 Echo: EF 35-40%, mildly dil PA; f. 04/2019 Echo: EF 30-35%, glob HK, nl RV fxn; g. s/p Cardiomems.  . Chronic kidney disease (CKD), stage IV (severe) ([place])   . CML (chronic myelocytic leukemia) ([place])   . GERD (gastroesophageal reflux disease)   . GIB (gastrointestinal bleeding)    a. 12/2017 3 unit PRBC GIB in setting of coumadin-->Endo/colon multiple duodenal ulcers and a single bleeding ulcer in the proximal ascending colon status post hemostatic clipping x2.  . Gout   . Hyperlipemia   . Hypertension   . Hypothyroidism   . Iron deficiency anemia   . Ischemic cardiomyopathy    a. Previously reduced EF-->50% by echo in 2012;  b. 06/2015 Echo: EF 50-55%, Gr2 DD, mild MR, mildly dil LA, Ao sclerosis, mild TR;  c. 07/2016 Echo: EF 45-50%; d. 12/2017 Echo: EF 55%, Gr1 DD, mild MR; e. 08/2018 Echo: EF 35-40%; f. 04/2019 Echo: EF 30-35%.  . Migraine    "in the 1960s" (08/15/2016)  . PAF (paroxysmal atrial fibrillation) ([facility])    a. ? Dx 2014-->s/p DCCV;  b. CHA2DS2VASc = 6--> Coumadin.  . Prostate cancer ([place])   . RBBB   . Type II diabetes mellitus ([place])    Past Surgical History:  Procedure Laterality Date  . CATARACT EXTRACTION W/ INTRAOCULAR LENS  IMPLANT, BILATERAL Bilateral   . COLONOSCOPY N/A 01/13/2018   Procedure: COLONOSCOPY;  Surgeon: Lin Landsman, MD;  Location: [facility] ENDOSCOPY;  Service: Gastroenterology;  Laterality: N/A;  . COLONOSCOPY WITH  PROPOFOL N/A 01/01/2018   Procedure: COLONOSCOPY WITH PROPOFOL;  Surgeon: Lin Landsman, MD;  Location[facility] ENDOSCOPY;  Service: Gastroenterology;  Laterality: N/A;  . CORONARY ANGIOPLASTY WITH STENT PLACEMENT  09/2012   2 stents  . CORONARY STENT INTERVENTION N/A 08/19/2016   Procedure: Coronary Stent Intervention;  Surgeon: [name], MD;  Location: [place] CV LAB;  Service: Cardiovascular;  Laterality: N/A;  . CORONARY STENT INTERVENTION N/A 04/13/2019   Procedure: CORONARY STENT INTERVENTION;  Surgeon: [name], MD;  Location: [place] CV LAB;  Service: Cardiovascular;  Laterality: N/A;  . ESOPHAGEAL MANOMETRY N/A 12/16/2017   Procedure: ESOPHAGEAL MANOMETRY (EM);  Surgeon: Lin Landsman, MD;  Location: [facility] ENDOSCOPY;  Service: Gastroenterology;  Laterality: N/A;  . ESOPHAGOGASTRODUODENOSCOPY N/A 12/20/2016   Procedure: ESOPHAGOGASTRODUODENOSCOPY (EGD);  Surgeon: Lin Landsman, MD;  Location: [facility] ENDOSCOPY;  Service: Gastroenterology;  Laterality: N/A;  . ESOPHAGOGASTRODUODENOSCOPY N/A 01/13/2018   Procedure: ESOPHAGOGASTRODUODENOSCOPY (EGD);  Surgeon: Lin Landsman, MD;  Location: [facility] ENDOSCOPY;  Service: Gastroenterology;  Laterality: N/A;  . ESOPHAGOGASTRODUODENOSCOPY (EGD) WITH PROPOFOL N/A 11/03/2017   Procedure: ESOPHAGOGASTRODUODENOSCOPY (EGD) WITH PROPOFOL;  Surgeon: Lin Landsman, MD;  Location: [facility] ENDOSCOPY;  Service: Gastroenterology;  Laterality: N/A;  . EYE SURGERY    . HERNIA REPAIR     "navel"  . LEFT HEART CATH AND CORONARY ANGIOGRAPHY N/A 08/14/2016   Procedure: Left Heart Cath and Coronary Angiography;  Surgeon: [name], MD;  Location: [place] CV LAB;  Service: Cardiovascular;  Laterality: N/A;  . PRESSURE SENSOR/CARDIOMEMS N/A 09/23/2019   Procedure: PRESSURE SENSOR/CARDIOMEMS;  Surgeon: Jolaine Artist, MD;  Location: [place] CV LAB;  Service: Cardiovascular;  Laterality: N/A;  . RIGHT HEART CATH  N/A 09/23/2019   Procedure: RIGHT HEART CATH;  Surgeon: Jolaine Artist, MD;  Location: [place] CV LAB;  Service: Cardiovascular;  Laterality: N/A;  . RIGHT/LEFT HEART CATH AND CORONARY ANGIOGRAPHY N/A 09/01/2018   Procedure: RIGHT/LEFT HEART CATH AND CORONARY ANGIOGRAPHY;  Surgeon: [name], MD;  Location: [place] CV LAB;  Service: Cardiovascular;  Laterality: N/A;  . RIGHT/LEFT HEART CATH AND CORONARY ANGIOGRAPHY N/A 04/13/2019   Procedure: RIGHT/LEFT HEART CATH AND CORONARY ANGIOGRAPHY;  Surgeon: [name], MD;  Location: [place] CV LAB;  Service: Cardiovascular;  Laterality: N/A;  . TOE AMPUTATION Right    "big toe"    No outpatient medications have been marked as taking for the 06/29/20 encounter (Appointment) with Nesreen Albano, [name], MD.    Allergies: Clopidogrel, Ciprofloxacin, Mirtazapine, and Spironolactone  Social History   Tobacco Use  . Smoking status: Former Smoker    Packs/day: 1.75    Years: 20.00    Pack years: 35.00    Types: Cigarettes    Start date: 1955    Quit date: 1975    Years since quitting: 47.3  . Smokeless tobacco: Former Network engineer  . Vaping Use: Never used  Substance Use Topics  . Alcohol use: No    Comment: previously drank heavily - quit 1979.  . Drug use: No  Family History  Problem Relation Age of Onset  . Brain cancer Father   . Diabetes Sister   . Heart attack Sister     Review of Systems: A 12-system review of systems was performed and was negative except as noted in the HPI.  --------------------------------------------------------------------------------------------------  Physical Exam: There were no vitals taken for this visit.  General:  NAD. Neck: No JVD or HJR. Lungs: Clear to auscultation bilaterally without wheezes or crackles. Heart: Regular rate and rhythm without murmurs, rubs, or gallops. Abdomen: Soft, nontender, nondistended. Extremities: No lower extremity edema.  EKG:   NSR with LAFB and RBBB (bifascicular block).  No significant change since prior tracing on 02/02/2020.  Lab Results  Component Value Date   WBC 6.8 11/04/2019   HGB 11.9 (L) 11/04/2019   HCT 39.9 11/04/2019   MCV 89.1 11/04/2019   PLT 223 11/04/2019    Lab Results  Component Value Date   NA 137 04/13/2020   K 4.3 04/13/2020   CL 102 04/13/2020   CO2 25 04/13/2020   BUN 27 (H) 04/13/2020   CREATININE 1.72 (H) 04/13/2020   GLUCOSE 161 (H) 04/13/2020   ALT 14 08/28/2018    Lab Results  Component Value Date   CHOL 110 08/28/2018   HDL 28 (L) 08/28/2018   LDLCALC 51 08/28/2018   TRIG 155 (H) 08/28/2018   CHOLHDL 3.9 08/28/2018    --------------------------------------------------------------------------------------------------  ASSESSMENT AND PLAN: CAD with stable angina: No angina reported on antianginal regimen consistent of isosorbide mononitrate, metoprolol succinate, and ranolazine.  DOE, that is likely multifactorial, continues to improve.  Continue secondary prevention, including ASA and warfarin.  I will check a CBC, BMP, and lipid panel today.  Chronic HFrEF: Mr. Attridge appears euvolemic with NYHA class II symptoms.  Continue current regimen of metoprolol succinate, empagliflozin, and losartan, as well as torsemide 60 mg daily.  I will check a BMP today.  Ongoing follow-up with the AHF, including CardioMEMS monitoring.  Paroxysmal atrial fibrillation: EKG shows NSR again today.  Continue metoprolol succinate and long-term anticoagulation with warfarin per our anticoagulation clinic.  Hypertension: BP mildly elevated today but better at other recent MD visits.  Defer medication changes today, though escalation of metoprolol and/or losartan should be considered in the future if blood pressure tolerates.  Hyperlipidemia associated with type 2 diabetes mellitus: Check lipid panel today.  LFT's normal on last check at [facility] in 03/2020.  Continue low-dose rosuvastatin for  now with target LDL < 70.  OSA: Recently diagnosed, awaiting CPAP titration.  I will reach out to Dr. Mortimer Fries and Ms. Volanda Napoleon (seeing Mr. Savidge on 07/12/2020) to see if they can assist with CPAP titration in [place] as Mr. Boettcher is unable to travel to [place] for this.  Follow-up: Return to clinic in 3 months.  [name], MD 06/29/2020 7:21 AM

## 2020-06-29 NOTE — Therapy (Signed)
[place] MAIN [facility] SERVICES [address] [place], [place], [address] Phone: [phone]   Fax:  [phone]  Physical Therapy Treatment  Patient Details  Name: Lucas Caldwell MRN: [id number] Date of Birth: Sep 11, 1933 Referring Provider (PT): [name], DO   Encounter Date: 06/29/2020   PT End of Session - 06/29/20 1657    Visit Number 6    Number of Visits 25    Date for PT Re-Evaluation 08/29/20    Authorization Time Period eval performed on 06/06/2020    PT Start Time 1517    PT Stop Time 1558    PT Time Calculation (min) 41 min    Equipment Utilized During Treatment Gait belt    Activity Tolerance Patient tolerated treatment well    Behavior During Therapy [facility] for tasks assessed/performed           Past Medical History:  Diagnosis Date  . Arthritis    "probably in his legs" (08/15/2016)  . Blind right eye   . BPH (benign prostatic hyperplasia)   . Breast asymmetry    Left breast is larger, present for several years.  [name] CAD (coronary artery disease)    a. Cath in the late 90's - reportedly ok;  b. 2014 s/p stenting x 2 @ [facility]; c 08/19/16 Cath/PCI with DES -> RCA, plan to treat LM 70% medically. Seen by surgery and felt to be too high risk for CABG; d. 08/2018 Cath: LM 70 (iFR 0.86), LAD 20ost, 40p, 50/23m, D1 70, LCX patent stent, OM1 30, RCA patent stent, 53m ISR, 40d, RPDA 30. PCWP 8. CO/CI 4.0/2.1; e. 03/2019 PCI to LAD (2.5x15 Resolute Onyx DES).  . Chronic combined systolic (congestive) and diastolic (congestive) heart failure ([place])    a. Previously reduced EF-->50% by echo in 2012;  b. 06/2015 Echo: EF 50-55%  c. 07/2016 Echo: EF 45-50%; d. 12/2017 Echo: EF 55%, Gr1 DD, mild MR; e. 08/2018 Echo: EF 35-40%, mildly dil PA; f. 04/2019 Echo: EF 30-35%, glob HK, nl RV fxn; g. s/p Cardiomems.  . Chronic kidney disease (CKD), stage IV (severe) ([place])   . CML (chronic myelocytic leukemia) ([place])   . GERD (gastroesophageal reflux  disease)   . GIB (gastrointestinal bleeding)    a. 12/2017 3 unit PRBC GIB in setting of coumadin-->Endo/colon multiple duodenal ulcers and a single bleeding ulcer in the proximal ascending colon status post hemostatic clipping x2.  . Gout   . Hyperlipemia   . Hypertension   . Hypothyroidism   . Iron deficiency anemia   . Ischemic cardiomyopathy    a. Previously reduced EF-->50% by echo in 2012;  b. 06/2015 Echo: EF 50-55%, Gr2 DD, mild MR, mildly dil LA, Ao sclerosis, mild TR;  c. 07/2016 Echo: EF 45-50%; d. 12/2017 Echo: EF 55%, Gr1 DD, mild MR; e. 08/2018 Echo: EF 35-40%; f. 04/2019 Echo: EF 30-35%.  . Migraine    "in the 1960s" (08/15/2016)  . Obstructive sleep apnea   . PAF (paroxysmal atrial fibrillation) (HCC)    a. ? Dx 2014-->s/p DCCV;  b. CHA2DS2VASc = 6--> Coumadin.  . Prostate cancer ([place])   . RBBB   . Type II diabetes mellitus ([place])     Past Surgical History:  Procedure Laterality Date  . CATARACT EXTRACTION W/ INTRAOCULAR LENS  IMPLANT, BILATERAL Bilateral   . COLONOSCOPY N/A 01/13/2018   Procedure: COLONOSCOPY;  Surgeon: Lin Landsman, MD;  Location: [facility] ENDOSCOPY;  Service: Gastroenterology;  Laterality: N/A;  .  COLONOSCOPY WITH PROPOFOL N/A 01/01/2018   Procedure: COLONOSCOPY WITH PROPOFOL;  Surgeon: Lin Landsman, MD;  Location: [facility] ENDOSCOPY;  Service: Gastroenterology;  Laterality: N/A;  . CORONARY ANGIOPLASTY WITH STENT PLACEMENT  09/2012   2 stents  . CORONARY STENT INTERVENTION N/A 08/19/2016   Procedure: Coronary Stent Intervention;  Surgeon: [name], MD;  Location: [place] CV LAB;  Service: Cardiovascular;  Laterality: N/A;  . CORONARY STENT INTERVENTION N/A 04/13/2019   Procedure: CORONARY STENT INTERVENTION;  Surgeon: [name], MD;  Location: [place] CV LAB;  Service: Cardiovascular;  Laterality: N/A;  . ESOPHAGEAL MANOMETRY N/A 12/16/2017   Procedure: ESOPHAGEAL MANOMETRY (EM);  Surgeon: Lin Landsman, MD;  Location:  [facility] ENDOSCOPY;  Service: Gastroenterology;  Laterality: N/A;  . ESOPHAGOGASTRODUODENOSCOPY N/A 12/20/2016   Procedure: ESOPHAGOGASTRODUODENOSCOPY (EGD);  Surgeon: Lin Landsman, MD;  Location: [facility] ENDOSCOPY;  Service: Gastroenterology;  Laterality: N/A;  . ESOPHAGOGASTRODUODENOSCOPY N/A 01/13/2018   Procedure: ESOPHAGOGASTRODUODENOSCOPY (EGD);  Surgeon: Lin Landsman, MD;  Location: [facility] ENDOSCOPY;  Service: Gastroenterology;  Laterality: N/A;  . ESOPHAGOGASTRODUODENOSCOPY (EGD) WITH PROPOFOL N/A 11/03/2017   Procedure: ESOPHAGOGASTRODUODENOSCOPY (EGD) WITH PROPOFOL;  Surgeon: Lin Landsman, MD;  Location: [facility] ENDOSCOPY;  Service: Gastroenterology;  Laterality: N/A;  . EYE SURGERY    . HERNIA REPAIR     "navel"  . LEFT HEART CATH AND CORONARY ANGIOGRAPHY N/A 08/14/2016   Procedure: Left Heart Cath and Coronary Angiography;  Surgeon: [name], MD;  Location: [place] CV LAB;  Service: Cardiovascular;  Laterality: N/A;  . PRESSURE SENSOR/CARDIOMEMS N/A 09/23/2019   Procedure: PRESSURE SENSOR/CARDIOMEMS;  Surgeon: Jolaine Artist, MD;  Location: [place] CV LAB;  Service: Cardiovascular;  Laterality: N/A;  . RIGHT HEART CATH N/A 09/23/2019   Procedure: RIGHT HEART CATH;  Surgeon: Jolaine Artist, MD;  Location: [place] CV LAB;  Service: Cardiovascular;  Laterality: N/A;  . RIGHT/LEFT HEART CATH AND CORONARY ANGIOGRAPHY N/A 09/01/2018   Procedure: RIGHT/LEFT HEART CATH AND CORONARY ANGIOGRAPHY;  Surgeon: [name], MD;  Location: [place] CV LAB;  Service: Cardiovascular;  Laterality: N/A;  . RIGHT/LEFT HEART CATH AND CORONARY ANGIOGRAPHY N/A 04/13/2019   Procedure: RIGHT/LEFT HEART CATH AND CORONARY ANGIOGRAPHY;  Surgeon: [name], MD;  Location: [place] CV LAB;  Service: Cardiovascular;  Laterality: N/A;  . TOE AMPUTATION Right    "big toe"    There were no vitals filed for this visit.   Subjective Assessment - 06/29/20 1655     Subjective Pt says he has been walking at home without his cane. Pt reports he feels he is doing much better.    Patient is accompained by: Family member    Pertinent History Pt is an 87 y/o male presenting to physical therapy today with spouse with reports of difficulty with balance, dizziness, and walking. Pt states he started feeling "swimmy headed" approximately one year ago and that it is affecting his balance with walking and has been progressively worsening. Pt reports he feels he is moving when he's still. He denies spinning sensation. He reports he sometimes has headache but says "they are not that bad." Pt reports feeling SOB with walking and household tasks (his doctor is aware). Pt denies unintentional weight gain or loss. Pt reports 1 fall the last 6 months. Per spouse pt tripped at time of fall and do not provide further details. Pt with extensive PMH. Relevant PMH per chart: Exertional dyspnea, centrilobular emphysema, angina, CAD, acute on chronic HFrEF, controlled DMII with diabetic  nephropathy, diabetic neuropathy,  gout, CKD, essential HTN, paroxysmal atrial fibrillation, chronic anticoagulation, CKD, fatigue, iron deficiency anemia, pain of LE, OA of B knees, hx of partial ray amputation of R great toe, s/p cataract extraction status of eye, enthesopathy of ankle and tarsus, GERD, hyperlipidemia, hypothyroidism, nuclear sclerosis of L eye, psedophakia of R eye, ischemic cardiomyopathy, esophageal dysphagia, chronic heart failure with preserved ejection fraction, PSVT, coronary stent patent, bifascicular block, trifascicular block, wound infection, urge incontinence of urine.    Limitations House hold activities;Lifting;Standing;Walking;Writing   Pt reports when he tries to write his hand shakes a lot   How long can you sit comfortably? not limited    How long can you stand comfortably? 5 minutes    How long can you walk comfortably? 10 minutes    Diagnostic tests DG Chest 2 View  from  07/06/2019 per chart: "  IMPRESSION:  No acute abnormalities."    Patient Stated Goals Feel more steady with walking    Currently in Pain? No/denies          TREATMENT  NEURO RE-ED: CGA-min assist for all of the following  Step ups forward/backward and side-to-side with UUE support to finger touch support on bar x multiple reps; only one instace of poor foot clearance.  Standing on airex pad with NBOS 4x30 sec  Hedgehog toe taps 3x10 each LE; intermittent UE support, pt required up to min assist d/t LOB to regain balance  Hedgehog cone taps with UUE support to one finger support on bar with forward stepping and cross-stepping x multiple reps each LE; frequent VC cuing provided  Standig on airex pad semi-tandem 6x30 sec each LE; up to min assist due to LOB to regain balance  SLB on even surface 6x30 sec each LE; pt able to perform on RLE for approx 5 seconds without intermittent UE support. Requires intermittent UE support for LLE.   Education provided throughout in the form of demo, VC/TC to facilitate movement at target joints and correct muscle activation with exercises. Pt demonstrates good carryover within session with cues.  Assessment: PT educated pt on continued use of SPC outside of therapy for safety. Pt verbalized understanding. The pt exhibits improvement with SLB, maintaining SLB on RLE for approximately 5 seconds today before requiring UE support. Pt also only with one instance of poor foot clearance with step-up exercises also indicating improvement. The pt will benefit from further skilled PT to improve BLE strength and balance to decrease fall risk.        Plan - 06/29/20 1705    Clinical Impression Statement PT educated pt on continued use of SPC outside of therapy for safety. Pt verbalized understanding. The pt exhibits improvement with SLB, maintaining SLB on RLE for approximately 5 seconds today before requiring UE support. Pt also only with one instance of poor foot  clearance with step-up exercises also indicating improvement. The pt will benefit from further skilled PT to improve BLE strength and balance to decrease fall risk.    Personal Factors and Comorbidities Age;Comorbidity 3+;Fitness;Comorbidity 1;Comorbidity 2;Past/Current Experience;Time since onset of injury/illness/exacerbation    Comorbidities Pertinent PMH/comorbidities per chart: Exertional dyspnea, centrilobular emphysema, angina, CAD, acute on chronic HFrEF, controlled DMII with diabetic nephropathy, diabetic neuropathy, gout, CKD, essential HTN, paroxysmal atrial fibrillation, chronic anticoagulation, CKD, fatigue, iron deficiency anemia, pain of LE, OA of B knees, hx of partial ray amputation of R great toe, s/p cataract extraction status of eye, enthesopathy of ankle and tarsus, GERD, hyperlipidemia, hypothyroidism,  nuclear sclerosis of L eye, psedophakia of R eye, ischemic cardiomyopathy, esophageal dysphagia, chronic heart failure with preserved ejection fraction, PSVT, coronary stent patent, bifascicular block, trifascicular block, wound infection, urge incontinence of urine.    Examination-Activity Limitations Bathing;Bed Mobility;Bend;Caring for Others;Carry;Continence;Dressing;Hygiene/Grooming;Stairs;Squat;Reach Overhead;Locomotion Level;Lift;Stand;Toileting;Transfers    Examination-Participation Restrictions Medication Management;Cleaning;Meal Prep;Community Activity;Driving;Laundry;Shop;Yard Work;Volunteer    Stability/Clinical Decision Making Evolving/Moderate complexity    Rehab Potential Good    PT Frequency 2x / week    PT Duration 12 weeks    PT Treatment/Interventions ADLs/Self Care Home Management;Biofeedback;Canalith Repostioning;Cryotherapy;Electrical Stimulation;Moist Heat;Traction;Ultrasound;DME Instruction;Gait training;Stair training;Functional mobility training;Therapeutic activities;Therapeutic exercise;Balance training;Neuromuscular re-education;Patient/family  education;Orthotic Fit/Training;Manual techniques;Wheelchair mobility training;Passive range of motion;Dry needling;Energy conservation;Taping;Vestibular;Joint Manipulations;Spinal Manipulations;Aquatic Therapy    PT Next Visit Plan initiate balance exercises, TUG, FGA, progress LE strengthening, foot clearance exercises, VORx1, hip abudction/extension, nustep    Consulted and Agree with Plan of Care Patient;Family member/caregiver    Family Member Consulted spouse             PT Short Term Goals - 06/07/20 [phone]      PT SHORT TERM GOAL #1   Title Patient will be independent in home exercise program to improve strength/mobility for better functional independence with ADLs.    Baseline 06/06/2020: HEP to be initiated next visit    Time 6    Period Weeks    Status New    Target Date 07/18/20             PT Long Term Goals - 06/13/20 1713      PT LONG TERM GOAL #1   Title Patient will increase FOTO score to equal to or greater than 54 to demonstrate statistically significant improvement in mobility and quality of life.    Baseline 06/06/2020: 46 (risk-adjusted 45)    Time 12    Period Weeks    Status New    Target Date 08/29/20      PT LONG TERM GOAL #2   Title Patient will increase 10 meter walk test to >1.1m/s as to improve gait speed for better community ambulation and to reduce fall risk.    Baseline 06/06/2020: 0.59 m/s with SPC    Time 12    Period Weeks    Status New    Target Date 08/29/20      PT LONG TERM GOAL #3   Title Patient will increase Berg Balance score by > 6 points to demonstrate decreased fall risk during functional activities.    Baseline 06/06/2020: 40/56    Time 12    Period Weeks    Status New    Target Date 08/29/20      PT LONG TERM GOAL #4   Title Patient (> 76 years old) will complete five times sit to stand test in < 15 seconds indicating an increased LE strength and improved balance.    Baseline 06/06/2020: 18 sec performed with BUE assist  off chair    Time 12    Period Weeks    Status New    Target Date 08/29/20      PT LONG TERM GOAL #5   Title Patient will reduce dizziness handicap inventory score by at least 18 points, for less dizziness with ADLs and increased safety with home and work tasks.    Baseline 06/06/2020: 48% (moderate handicap)    Time 12    Period Weeks    Status New    Target Date 08/29/20      Additional Long Term Goals  Additional Long Term Goals Yes      PT LONG TERM GOAL #6   Title Patient will increase Functional Gait Assessment score to >20/30 as to reduce fall risk and improve dynamic gait safety with community ambulation.    Baseline 06/13/2020: 15/30    Time 11    Period Weeks    Status New    Target Date 08/29/20             Patient will benefit from skilled therapeutic intervention in order to improve the following deficits and impairments:  Abnormal gait,Dizziness,Increased fascial restricitons,Impaired sensation,Improper body mechanics,Pain,Cardiopulmonary status limiting activity,Decreased coordination,Decreased mobility,Postural dysfunction,Decreased activity tolerance,Decreased endurance,Decreased range of motion,Hypomobility,Decreased strength,Decreased balance,Difficulty walking,Impaired flexibility  Visit Diagnosis: Other abnormalities of gait and mobility  Other lack of coordination  Unsteadiness on feet  Muscle weakness (generalized)     Problem List Patient Active Problem List   Diagnosis Date Noted  . Obstructive sleep apnea 06/29/2020  . Urge incontinence of urine 12/22/2019  . Trifascicular block 10/20/2019  . Wound infection 10/08/2019  . Bifascicular block 05/13/2019  . Coronary stent patent 04/13/2019  . Chronic HFrEF (heart failure with reduced ejection fraction) ([place]) 03/19/2019  . PSVT (paroxysmal supraventricular tachycardia) ([place]) 03/19/2019  . 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 ([place]) 08/24/2018  . CKD (chronic kidney disease),  stage IV ([place]) 06/04/2018  . Osteoarthritis of knees, bilateral 06/02/2018  . Hyperlipidemia associated with type 2 diabetes mellitus ([place]) 05/06/2018  . Pain of lower extremity 03/06/2018  . Duodenitis 01/30/2018  . Chronic heart failure with preserved ejection fraction (HFpEF) ([place]) 01/23/2018  . Hematochezia   . Melena   . Rectal bleeding 01/11/2018  . AVM (arteriovenous malformation) of colon without hemorrhage   . Schatzki's ring   . Esophageal dysphagia   . Diabetic retinopathy ([place]) 07/08/2017  . Ischemic cardiomyopathy 06/26/2017  . Cardiomyopathy ([place]) 06/26/2017  . Centrilobular emphysema ([place]) 06/13/2017  . Fatigue 12/18/2016  . Stable angina ([place]) 09/20/2016  . Stage 3a chronic kidney disease ([place]) 09/20/2016  . Paroxysmal atrial fibrillation ([place]) 08/28/2016  . Chronic anticoagulation 08/28/2016  . Essential hypertension 08/20/2016  . 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 heart with stable angina pectoris ([place]) 08/19/2016  . Accelerating angina ([place]) 08/15/2016  . Exertional dyspnea 08/12/2016  . 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[place]) 12/29/2015  . Gynecomastia, male 03/31/2014  . Hypertrophy of breast 03/31/2014  . Nuclear sclerosis of left eye 10/22/2013  . Enthesopathy of ankle and tarsus 04/10/2013  . Controlled type 2 diabetes mellitus with diabetic nephropathy ([place]) 04/09/2013  . Diabetic neuropathy associated with type 2 diabetes mellitus ([place]) 04/09/2013  . Cataract of left eye 11/25/2011  . Corneal opacity 09/30/2011  . Pseudophakia of right eye 09/30/2011  . GERD (gastroesophageal reflux disease) 09/25/2011  . Gout 09/25/2011  . Hyperlipidemia LDL goal <70 09/25/2011  . Status post cataract extraction 04/11/2011  . Cataract extraction status of eye 04/11/2011  . Iron deficiency anemia 04/01/2011  . Benign localized hyperplasia of prostate without urinary obstruction and other lower urinary tract symptoms (LUTS) 12/10/2010  . Enlarged prostate without  lower urinary tract symptoms (luts) 12/10/2010  . Colon cancer screening 10/11/2010  . Hypothyroidism 10/11/2010  . Hyperlipidemia 09/20/2003  . Essential and other specified forms of tremor 01/05/2001  . Essential tremor 01/05/2001   Ricard Dillon PT, DPT 06/29/2020, 5:06 PM  [place] MAIN [facility] SERVICES [address] [place], [place], [address] Phone: [phone]   Fax:  [phone]  Name: ANKITH EDMONSTON MRN: [id number] Date of Birth: January 03, 1934

## 2020-06-30 ENCOUNTER — Ambulatory Visit: Payer: Medicare HMO

## 2020-06-30 LAB — BASIC METABOLIC PANEL
BUN/Creatinine Ratio: 14 (ref 10–24)
BUN: 22 mg/dL (ref 8–27)
CO2: 17 mmol/L — ABNORMAL LOW (ref 20–29)
Calcium: 9.2 mg/dL (ref 8.6–10.2)
Chloride: 100 mmol/L (ref 96–106)
Creatinine, Ser: 1.55 mg/dL — ABNORMAL HIGH (ref 0.76–1.27)
Glucose: 156 mg/dL — ABNORMAL HIGH (ref 65–99)
Potassium: 4.6 mmol/L (ref 3.5–5.2)
Sodium: 134 mmol/L (ref 134–144)
eGFR: 44 mL/min/{1.73_m2} — ABNORMAL LOW (ref >59)

## 2020-06-30 LAB — CBC
Hematocrit: 39.4 % (ref 37.5–51.0)
Hemoglobin: 13.2 g/dL (ref 13.0–17.7)
MCH: 28.2 pg (ref 26.6–33.0)
MCHC: 33.5 g/dL (ref 31.5–35.7)
MCV: 84 fL (ref 79–97)
Platelets: 182 10*3/uL (ref 150–450)
RBC: 4.68 x10E6/uL (ref 4.14–5.80)
RDW: 15.6 % — ABNORMAL HIGH (ref 11.6–15.4)
WBC: 7 10*3/uL (ref 3.4–10.8)

## 2020-06-30 LAB — LIPID PANEL
Chol/HDL Ratio: 4.2 ratio (ref 0.0–5.0)
Cholesterol, Total: 123 mg/dL (ref 100–199)
HDL: 29 mg/dL — ABNORMAL LOW (ref >39)
LDL Chol Calc (NIH): 65 mg/dL (ref 0–99)
Triglycerides: 170 mg/dL — ABNORMAL HIGH (ref 0–149)
VLDL Cholesterol Cal: 29 mg/dL (ref 5–40)

## 2020-07-04 ENCOUNTER — Other Ambulatory Visit: Payer: Self-pay

## 2020-07-04 ENCOUNTER — Telehealth: Payer: Self-pay

## 2020-07-04 ENCOUNTER — Ambulatory Visit: Payer: Medicare HMO

## 2020-07-04 DIAGNOSIS — R278 Other lack of coordination: Secondary | ICD-10-CM | POA: Diagnosis not present

## 2020-07-04 DIAGNOSIS — G4719 Other hypersomnia: Secondary | ICD-10-CM

## 2020-07-04 DIAGNOSIS — M6281 Muscle weakness (generalized): Secondary | ICD-10-CM

## 2020-07-04 DIAGNOSIS — R2681 Unsteadiness on feet: Secondary | ICD-10-CM | POA: Diagnosis not present

## 2020-07-04 DIAGNOSIS — G4733 Obstructive sleep apnea (adult) (pediatric): Secondary | ICD-10-CM

## 2020-07-04 DIAGNOSIS — R2689 Other abnormalities of gait and mobility: Secondary | ICD-10-CM | POA: Diagnosis not present

## 2020-07-04 NOTE — Telephone Encounter (Signed)
cpap titration has been ordered.  Patient's spouse, [name](DPR) is aware and voiced her understanding.  Nothing further needed at this time.

## 2020-07-04 NOTE — Telephone Encounter (Signed)
-----   Message from Martyn Ehrich, NP sent at 07/04/2020  2:43 PM EDT ----- Regarding: CPAP TITRATOIN STUDY Can we get patient CPAP titration study in [place]?   ----- Message ----- From: [name], MD Sent: 06/29/2020   2:13 PM EDT To: Flora Lipps, MD, Martyn Ehrich, NP  Hello,  Lucas Caldwell was recently dx'ed with OSA but has been unable to move forward with CPAP titration in [place].  Would you be able to help arrange for CPAP titration in [place]?  I believe he is scheduled for f/u with you in ~2 week.  Let me know if any questions or concerns come up.  Thanks.  Chris End

## 2020-07-04 NOTE — Therapy (Signed)
[place] MAIN [facility] SERVICES [address] [place], [place], [address] Phone: [phone]   Fax:  [phone]  Physical Therapy Treatment  Patient Details  Name: Lucas Caldwell MRN: [id number] Date of Birth: 1933-12-23 Referring Provider (PT): [name], DO   Encounter Date: 07/04/2020   PT End of Session - 07/04/20 1712    Visit Number 7    Number of Visits 25    Date for PT Re-Evaluation 08/29/20    Authorization Time Period eval performed on 06/06/2020    PT Start Time 1302    PT Stop Time 1345    PT Time Calculation (min) 43 min    Equipment Utilized During Treatment Gait belt    Activity Tolerance Patient tolerated treatment well    Behavior During Therapy [facility] for tasks assessed/performed           Past Medical History:  Diagnosis Date  . Arthritis    "probably in his legs" (08/15/2016)  . Blind right eye   . BPH (benign prostatic hyperplasia)   . Breast asymmetry    Left breast is larger, present for several years.  [name] CAD (coronary artery disease)    a. Cath in the late 90's - reportedly ok;  b. 2014 s/p stenting x 2 @ [facility]; c 08/19/16 Cath/PCI with DES -> RCA, plan to treat LM 70% medically. Seen by surgery and felt to be too high risk for CABG; d. 08/2018 Cath: LM 70 (iFR 0.86), LAD 20ost, 40p, 50/73m, D1 70, LCX patent stent, OM1 30, RCA patent stent, 81m ISR, 40d, RPDA 30. PCWP 8. CO/CI 4.0/2.1; e. 03/2019 PCI to LAD (2.5x15 Resolute Onyx DES).  . Chronic combined systolic (congestive) and diastolic (congestive) heart failure ([place])    a. Previously reduced EF-->50% by echo in 2012;  b. 06/2015 Echo: EF 50-55%  c. 07/2016 Echo: EF 45-50%; d. 12/2017 Echo: EF 55%, Gr1 DD, mild MR; e. 08/2018 Echo: EF 35-40%, mildly dil PA; f. 04/2019 Echo: EF 30-35%, glob HK, nl RV fxn; g. s/p Cardiomems.  . Chronic kidney disease (CKD), stage IV (severe) ([place])   . CML (chronic myelocytic leukemia) ([place])   . GERD (gastroesophageal reflux  disease)   . GIB (gastrointestinal bleeding)    a. 12/2017 3 unit PRBC GIB in setting of coumadin-->Endo/colon multiple duodenal ulcers and a single bleeding ulcer in the proximal ascending colon status post hemostatic clipping x2.  . Gout   . Hyperlipemia   . Hypertension   . Hypothyroidism   . Iron deficiency anemia   . Ischemic cardiomyopathy    a. Previously reduced EF-->50% by echo in 2012;  b. 06/2015 Echo: EF 50-55%, Gr2 DD, mild MR, mildly dil LA, Ao sclerosis, mild TR;  c. 07/2016 Echo: EF 45-50%; d. 12/2017 Echo: EF 55%, Gr1 DD, mild MR; e. 08/2018 Echo: EF 35-40%; f. 04/2019 Echo: EF 30-35%.  . Migraine    "in the 1960s" (08/15/2016)  . Obstructive sleep apnea   . PAF (paroxysmal atrial fibrillation) (HCC)    a. ? Dx 2014-->s/p DCCV;  b. CHA2DS2VASc = 6--> Coumadin.  . Prostate cancer ([place])   . RBBB   . Type II diabetes mellitus ([place])     Past Surgical History:  Procedure Laterality Date  . CATARACT EXTRACTION W/ INTRAOCULAR LENS  IMPLANT, BILATERAL Bilateral   . COLONOSCOPY N/A 01/13/2018   Procedure: COLONOSCOPY;  Surgeon: Lin Landsman, MD;  Location: [facility] ENDOSCOPY;  Service: Gastroenterology;  Laterality: N/A;  .  COLONOSCOPY WITH PROPOFOL N/A 01/01/2018   Procedure: COLONOSCOPY WITH PROPOFOL;  Surgeon: Lin Landsman, MD;  Location: [facility] ENDOSCOPY;  Service: Gastroenterology;  Laterality: N/A;  . CORONARY ANGIOPLASTY WITH STENT PLACEMENT  09/2012   2 stents  . CORONARY STENT INTERVENTION N/A 08/19/2016   Procedure: Coronary Stent Intervention;  Surgeon: [name], MD;  Location: [place] CV LAB;  Service: Cardiovascular;  Laterality: N/A;  . CORONARY STENT INTERVENTION N/A 04/13/2019   Procedure: CORONARY STENT INTERVENTION;  Surgeon: [name], MD;  Location: [place] CV LAB;  Service: Cardiovascular;  Laterality: N/A;  . ESOPHAGEAL MANOMETRY N/A 12/16/2017   Procedure: ESOPHAGEAL MANOMETRY (EM);  Surgeon: Lin Landsman, MD;  Location:  [facility] ENDOSCOPY;  Service: Gastroenterology;  Laterality: N/A;  . ESOPHAGOGASTRODUODENOSCOPY N/A 12/20/2016   Procedure: ESOPHAGOGASTRODUODENOSCOPY (EGD);  Surgeon: Lin Landsman, MD;  Location[facility] ENDOSCOPY;  Service: Gastroenterology;  Laterality: N/A;  . ESOPHAGOGASTRODUODENOSCOPY N/A 01/13/2018   Procedure: ESOPHAGOGASTRODUODENOSCOPY (EGD);  Surgeon: Lin Landsman, MD;  Location: [facility] ENDOSCOPY;  Service: Gastroenterology;  Laterality: N/A;  . ESOPHAGOGASTRODUODENOSCOPY (EGD) WITH PROPOFOL N/A 11/03/2017   Procedure: ESOPHAGOGASTRODUODENOSCOPY (EGD) WITH PROPOFOL;  Surgeon: Lin Landsman, MD;  Location: [facility] ENDOSCOPY;  Service: Gastroenterology;  Laterality: N/A;  . EYE SURGERY    . HERNIA REPAIR     "navel"  . LEFT HEART CATH AND CORONARY ANGIOGRAPHY N/A 08/14/2016   Procedure: Left Heart Cath and Coronary Angiography;  Surgeon: [name], MD;  Location: [place] CV LAB;  Service: Cardiovascular;  Laterality: N/A;  . PRESSURE SENSOR/CARDIOMEMS N/A 09/23/2019   Procedure: PRESSURE SENSOR/CARDIOMEMS;  Surgeon: Jolaine Artist, MD;  Location: [place] CV LAB;  Service: Cardiovascular;  Laterality: N/A;  . RIGHT HEART CATH N/A 09/23/2019   Procedure: RIGHT HEART CATH;  Surgeon: Jolaine Artist, MD;  Location: [place] CV LAB;  Service: Cardiovascular;  Laterality: N/A;  . RIGHT/LEFT HEART CATH AND CORONARY ANGIOGRAPHY N/A 09/01/2018   Procedure: RIGHT/LEFT HEART CATH AND CORONARY ANGIOGRAPHY;  Surgeon: [name], MD;  Location: [place] CV LAB;  Service: Cardiovascular;  Laterality: N/A;  . RIGHT/LEFT HEART CATH AND CORONARY ANGIOGRAPHY N/A 04/13/2019   Procedure: RIGHT/LEFT HEART CATH AND CORONARY ANGIOGRAPHY;  Surgeon: [name], MD;  Location: [place] CV LAB;  Service: Cardiovascular;  Laterality: N/A;  . TOE AMPUTATION Right    "big toe"    There were no vitals filed for this visit.   Subjective Assessment - 07/04/20 1259     Subjective Pt reports no pain today. Pt states, "I'm still dizzy." Pt reports when he drives in his car and he stops he feels like he's still moving forward.    Patient is accompained by: Family member    Pertinent History Pt is an 87 y/o male presenting to physical therapy today with spouse with reports of difficulty with balance, dizziness, and walking. Pt states he started feeling "swimmy headed" approximately one year ago and that it is affecting his balance with walking and has been progressively worsening. Pt reports he feels he is moving when he's still. He denies spinning sensation. He reports he sometimes has headache but says "they are not that bad." Pt reports feeling SOB with walking and household tasks (his doctor is aware). Pt denies unintentional weight gain or loss. Pt reports 1 fall the last 6 months. Per spouse pt tripped at time of fall and do not provide further details. Pt with extensive PMH. Relevant PMH per chart: Exertional dyspnea, centrilobular emphysema, angina, CAD,  acute on chronic HFrEF, controlled DMII with diabetic nephropathy, diabetic neuropathy,  gout, CKD, essential HTN, paroxysmal atrial fibrillation, chronic anticoagulation, CKD, fatigue, iron deficiency anemia, pain of LE, OA of B knees, hx of partial ray amputation of R great toe, s/p cataract extraction status of eye, enthesopathy of ankle and tarsus, GERD, hyperlipidemia, hypothyroidism, nuclear sclerosis of L eye, psedophakia of R eye, ischemic cardiomyopathy, esophageal dysphagia, chronic heart failure with preserved ejection fraction, PSVT, coronary stent patent, bifascicular block, trifascicular block, wound infection, urge incontinence of urine.    Limitations House hold activities;Lifting;Standing;Walking;Writing   Pt reports when he tries to write his hand shakes a lot   How long can you sit comfortably? not limited    How long can you stand comfortably? 5 minutes    How long can you walk comfortably? 10  minutes    Diagnostic tests DG Chest 2 View  from 07/06/2019 per chart: "  IMPRESSION:  No acute abnormalities."    Patient Stated Goals Feel more steady with walking    Currently in Pain? No/denies          TREATMENT  THEREX:  Seated LAQ 2.5# 3x15; VC/Demo for technique HR 80 bpm  Nustep level 1 x 5 min; VC for SPM >60s, pt maintained in SPM in 90s. HR monitored:  at 2 min HR 95 bpm At 5 min HR 98 bpm Pt rates overall exercise difficulty "medium"  Step-ups onto 6" step with 2.5# AWs on BLEs 3x15 reps; pt rates medium; HR with exercise 94 bpm Lateral step-ups onto 6" step with 2.5# AW on BLEs x multiple reps each side; Post-exercise HR 80 bpm  Neuro Re-Ed: in // bars, CGA-min assist for the following  Stepping over orange hurdles 10x back and forth using UUE support to no UUE support  Side-stepping over orange hurdles with UUE support 6x each way  Seated VORx1 with horizontal head turns, seated 4x30 sec; VC/demo for technique. Handout provided. Pt reports dizziness with exercise.    Education provided throughout in the form of demo, VC/TC to facilitate movement at target joints and correct muscle activation with exercises. Pt demonstrates good carryover within session with cues.   Access Code: [email or web address] URL: [email or web address] Date: 07/04/2020 Prepared by: Ricard Dillon  Exercises  Seated Gaze Stabilization with Head Rotation - 1 x daily - 7 x weekly - 6 sets - 1 reps - 30 hold   PT Education - 07/04/20 1712    Education Details further instruction in vorx1 exercise    Person(s) Educated Patient    Methods Explanation;Demonstration;Verbal cues;Handout    Comprehension Verbalized understanding;Returned demonstration            PT Short Term Goals - 06/07/20 4782      PT SHORT TERM GOAL #1   Title Patient will be independent in home exercise program to improve strength/mobility for better functional independence with ADLs.    Baseline 06/06/2020: HEP  to be initiated next visit    Time 6    Period Weeks    Status New    Target Date 07/18/20             PT Long Term Goals - 06/13/20 1713      PT LONG TERM GOAL #1   Title Patient will increase FOTO score to equal to or greater than 54 to demonstrate statistically significant improvement in mobility and quality of life.    Baseline 06/06/2020: 46 (risk-adjusted 45)    Time 12  Period Weeks    Status New    Target Date 08/29/20      PT LONG TERM GOAL #2   Title Patient will increase 10 meter walk test to >1.38m/s as to improve gait speed for better community ambulation and to reduce fall risk.    Baseline 06/06/2020: 0.59 m/s with SPC    Time 12    Period Weeks    Status New    Target Date 08/29/20      PT LONG TERM GOAL #3   Title Patient will increase Berg Balance score by > 6 points to demonstrate decreased fall risk during functional activities.    Baseline 06/06/2020: 40/56    Time 12    Period Weeks    Status New    Target Date 08/29/20      PT LONG TERM GOAL #4   Title Patient (> 24 years old) will complete five times sit to stand test in < 15 seconds indicating an increased LE strength and improved balance.    Baseline 06/06/2020: 18 sec performed with BUE assist off chair    Time 12    Period Weeks    Status New    Target Date 08/29/20      PT LONG TERM GOAL #5   Title Patient will reduce dizziness handicap inventory score by at least 18 points, for less dizziness with ADLs and increased safety with home and work tasks.    Baseline 06/06/2020: 48% (moderate handicap)    Time 12    Period Weeks    Status New    Target Date 08/29/20      Additional Long Term Goals   Additional Long Term Goals Yes      PT LONG TERM GOAL #6   Title Patient will increase Functional Gait Assessment score to >20/30 as to reduce fall risk and improve dynamic gait safety with community ambulation.    Baseline 06/13/2020: 15/30    Time 11    Period Weeks    Status New    Target  Date 08/29/20                 Plan - 07/04/20 1717    Clinical Impression Statement Pt challenged with dynamic balance exercises today, particularly with side-stepping over orange in // bars. The pt required up to min assist x1 to maintain balance due to LOB, and had difficulty clearing hurdles. The pt was also provided a handout on how to perform seated VORx1 with horizontal head turns. The pt will benefit from further skilled therapy to improve BLE strength and balance in order to increase safety with all activities.    Personal Factors and Comorbidities Age;Comorbidity 3+;Fitness;Comorbidity 1;Comorbidity 2;Past/Current Experience;Time since onset of injury/illness/exacerbation    Comorbidities Pertinent PMH/comorbidities per chart: Exertional dyspnea, centrilobular emphysema, angina, CAD, acute on chronic HFrEF, controlled DMII with diabetic nephropathy, diabetic neuropathy, gout, CKD, essential HTN, paroxysmal atrial fibrillation, chronic anticoagulation, CKD, fatigue, iron deficiency anemia, pain of LE, OA of B knees, hx of partial ray amputation of R great toe, s/p cataract extraction status of eye, enthesopathy of ankle and tarsus, GERD, hyperlipidemia, hypothyroidism, nuclear sclerosis of L eye, psedophakia of R eye, ischemic cardiomyopathy, esophageal dysphagia, chronic heart failure with preserved ejection fraction, PSVT, coronary stent patent, bifascicular block, trifascicular block, wound infection, urge incontinence of urine.    Examination-Activity Limitations Bathing;Bed Mobility;Bend;Caring for Others;Carry;Continence;Dressing;Hygiene/Grooming;Stairs;Squat;Reach Overhead;Locomotion Level;Lift;Stand;Toileting;Transfers    Examination-Participation Restrictions Medication Management;Cleaning;Meal Prep;Community Activity;Driving;Laundry;Shop;Yard Work;Volunteer    Stability/Clinical Decision Making  Evolving/Moderate complexity    Rehab Potential Good    PT Frequency 2x / week    PT  Duration 12 weeks    PT Treatment/Interventions ADLs/Self Care Home Management;Biofeedback;Canalith Repostioning;Cryotherapy;Electrical Stimulation;Moist Heat;Traction;Ultrasound;DME Instruction;Gait training;Stair training;Functional mobility training;Therapeutic activities;Therapeutic exercise;Balance training;Neuromuscular re-education;Patient/family education;Orthotic Fit/Training;Manual techniques;Wheelchair mobility training;Passive range of motion;Dry needling;Energy conservation;Taping;Vestibular;Joint Manipulations;Spinal Manipulations;Aquatic Therapy    PT Next Visit Plan initiate balance exercises, TUG, FGA, progress LE strengthening, foot clearance exercises, VORx1, hip abudction/extension, nustep, dynamic balance exercises    Consulted and Agree with Plan of Care Patient;Family member/caregiver    Family Member Consulted spouse           Patient will benefit from skilled therapeutic intervention in order to improve the following deficits and impairments:  Abnormal gait,Dizziness,Increased fascial restricitons,Impaired sensation,Improper body mechanics,Pain,Cardiopulmonary status limiting activity,Decreased coordination,Decreased mobility,Postural dysfunction,Decreased activity tolerance,Decreased endurance,Decreased range of motion,Hypomobility,Decreased strength,Decreased balance,Difficulty walking,Impaired flexibility  Visit Diagnosis: Muscle weakness (generalized)  Unsteadiness on feet  Other abnormalities of gait and mobility     Problem List Patient Active Problem List   Diagnosis Date Noted  . Obstructive sleep apnea 06/29/2020  . Urge incontinence of urine 12/22/2019  . Trifascicular block 10/20/2019  . Wound infection 10/08/2019  . Bifascicular block 05/13/2019  . Coronary stent patent 04/13/2019  . Chronic HFrEF (heart failure with reduced ejection fraction) ([place]) 03/19/2019  . PSVT (paroxysmal supraventricular tachycardia) ([place]) 03/19/2019  . History of partial  ray amputation of right great toe ([place]) 08/24/2018  . CKD (chronic kidney disease), stage IV ([place]) 06/04/2018  . Osteoarthritis of knees, bilateral 06/02/2018  . Hyperlipidemia associated with type 2 diabetes mellitus ([place]) 05/06/2018  . Pain of lower extremity 03/06/2018  . Duodenitis 01/30/2018  . Chronic heart failure with preserved ejection fraction (HFpEF) ([place]) 01/23/2018  . Hematochezia   . Melena   . Rectal bleeding 01/11/2018  . AVM (arteriovenous malformation) of colon without hemorrhage   . Schatzki's ring   . Esophageal dysphagia   . Diabetic retinopathy ([place]) 07/08/2017  . Ischemic cardiomyopathy 06/26/2017  . Cardiomyopathy ([place]) 06/26/2017  . Centrilobular emphysema ([place]) 06/13/2017  . Fatigue 12/18/2016  . Stable angina ([place]) 09/20/2016  . Stage 3a chronic kidney disease ([place]) 09/20/2016  . Paroxysmal atrial fibrillation ([place]) 08/28/2016  . Chronic anticoagulation 08/28/2016  . Essential hypertension 08/20/2016  . 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 heart with stable angina pectoris ([place]) 08/19/2016  . Accelerating angina ([place]) 08/15/2016  . Exertional dyspnea 08/12/2016  . 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[place]) 12/29/2015  . Gynecomastia, male 03/31/2014  . Hypertrophy of breast 03/31/2014  . Nuclear sclerosis of left eye 10/22/2013  . Enthesopathy of ankle and tarsus 04/10/2013  . Controlled type 2 diabetes mellitus with diabetic nephropathy ([place]) 04/09/2013  . Diabetic neuropathy associated with type 2 diabetes mellitus ([place]) 04/09/2013  . Cataract of left eye 11/25/2011  . Corneal opacity 09/30/2011  . Pseudophakia of right eye 09/30/2011  . GERD (gastroesophageal reflux disease) 09/25/2011  . Gout 09/25/2011  . Hyperlipidemia LDL goal <70 09/25/2011  . Status post cataract extraction 04/11/2011  . Cataract extraction status of eye 04/11/2011  . Iron deficiency anemia 04/01/2011  . Benign localized hyperplasia of prostate without urinary obstruction and  other lower urinary tract symptoms (LUTS) 12/10/2010  . Enlarged prostate without lower urinary tract symptoms (luts) 12/10/2010  . Colon cancer screening 10/11/2010  . Hypothyroidism 10/11/2010  . Hyperlipidemia 09/20/2003  . Essential and other specified forms of tremor 01/05/2001  . Essential tremor 01/05/2001   Hildred Alamin  Lacretia Nicks PT, DPT 07/04/2020, 5:20 PM  [place] MAIN [facility] SERVICES [address] [place], [place], [address] Phone: [phone]   Fax:  [phone]  Name: WETZEL MEESTER MRN: [id number] Date of Birth: Jan 04, 1934

## 2020-07-05 ENCOUNTER — Ambulatory Visit: Payer: Medicare HMO | Admitting: Pharmacist

## 2020-07-05 ENCOUNTER — Other Ambulatory Visit: Payer: Self-pay | Admitting: Family Medicine

## 2020-07-05 DIAGNOSIS — E1121 Type 2 diabetes mellitus with diabetic nephropathy: Secondary | ICD-10-CM | POA: Diagnosis not present

## 2020-07-05 DIAGNOSIS — E785 Hyperlipidemia, unspecified: Secondary | ICD-10-CM

## 2020-07-05 DIAGNOSIS — Z794 Long term (current) use of insulin: Secondary | ICD-10-CM | POA: Diagnosis not present

## 2020-07-05 DIAGNOSIS — I5032 Chronic diastolic (congestive) heart failure: Secondary | ICD-10-CM

## 2020-07-05 DIAGNOSIS — E039 Hypothyroidism, unspecified: Secondary | ICD-10-CM

## 2020-07-05 DIAGNOSIS — I10 Essential (primary) hypertension: Secondary | ICD-10-CM | POA: Diagnosis not present

## 2020-07-05 DIAGNOSIS — E1165 Type 2 diabetes mellitus with hyperglycemia: Secondary | ICD-10-CM | POA: Diagnosis not present

## 2020-07-05 DIAGNOSIS — E119 Type 2 diabetes mellitus without complications: Secondary | ICD-10-CM | POA: Diagnosis not present

## 2020-07-05 DIAGNOSIS — H18599 Other hereditary corneal dystrophies, unspecified eye: Secondary | ICD-10-CM | POA: Diagnosis not present

## 2020-07-05 DIAGNOSIS — E1169 Type 2 diabetes mellitus with other specified complication: Secondary | ICD-10-CM

## 2020-07-05 DIAGNOSIS — H35372 Puckering of macula, left eye: Secondary | ICD-10-CM | POA: Diagnosis not present

## 2020-07-05 DIAGNOSIS — H35342 Macular cyst, hole, or pseudohole, left eye: Secondary | ICD-10-CM | POA: Diagnosis not present

## 2020-07-05 NOTE — Patient Instructions (Signed)
Visit Information  PATIENT GOALS: Goals Addressed            This Visit's Progress   . Pharmacy Goals       Please check your blood sugar, keep a log of the results and bring this with you to your medical appointments.  Please check your home blood pressure, keep a log of the results and bring this with you to your medical appointments.  Our goal bad cholesterol, or LDL, is less than 70 . This is why it is important to continue taking your rosuvastatin.  Feel free to call me with any questions or concerns. I look forward to our next call!   [name], PharmD, [facility] [phone]       The patient verbalized understanding of instructions, educational materials, and care plan provided today and declined offer to receive copy of patient instructions, educational materials, and care plan.   Telephone follow up appointment with care management team member scheduled for: 08/14/2020 at 11:15 AM

## 2020-07-05 NOTE — Chronic Care Management (AMB) (Signed)
Chronic Care Management Pharmacy Note  07/05/2020 Name:  Lucas Caldwell MRN:  [id number] DOB:  1933/07/08  Subjective: Lucas Caldwell is an 87 y.o. year old male who is a primary patient of [name], DO.  The CCM team was consulted for assistance with disease management and care coordination needs.    Engaged with patient by telephone for follow up visit in response to provider referral for pharmacy case management and/or care coordination services.   Consent to Services:  The patient was given information about Chronic Care Management services, agreed to services, and gave verbal consent prior to initiation of services.  Please see initial visit note for detailed documentation.   Patient Care Team: [name], DO as PCP - General (Family Medicine) End, [name], MD as PCP - Cardiology (Cardiology) Nicholis Stepanek, [name], RPH-CPP as Pharmacist [name] [name], RN as Case Manager (General Practice)  Recent office visits: Office Visit with PCP on 3/4 for loss of balance Office Visit with PCP on 3/30 for T2DM follow up  Recent consult visits: Office Visit with Podiatry on 3/28 and 4/11 Office Visit with Anti-coag Clinic on 4/13 Office Visit with Cardiology on 4/14  Hospital visits: None in previous 6 months  Objective:  Lab Results  Component Value Date   CREATININE 1.55 (H) 06/29/2020   CREATININE 1.72 (H) 04/13/2020   CREATININE 1.60 (H) 02/21/2020    Lab Results  Component Value Date   HGBA1C 7.1 (A) 06/14/2020   Last diabetic Eye exam:  Lab Results  Component Value Date/Time   HMDIABEYEEXA Retinopathy (A) 12/07/2019 12:00 AM    Last diabetic Foot exam: No results found for: HMDIABFOOTEX      Component Value Date/Time   CHOL 123 06/29/2020 1411   TRIG 170 (H) 06/29/2020 1411   HDL 29 (L) 06/29/2020 1411   CHOLHDL 4.2 06/29/2020 1411   CHOLHDL 3.9 08/28/2018 1107   VLDL 31 08/28/2018 1107   LDLCALC 65 06/29/2020 1411     Hepatic Function Latest Ref Rng & Units 08/28/2018 04/22/2018 01/11/2018  Total Protein 6.5 - 8.1 g/dL 8.0 7.2 6.4(L)  Albumin 3.5 - 5.0 g/dL 3.7 4.0 3.2(L)  AST 15 - 41 U/L 23 21 42(H)  ALT 0 - 44 U/L '14 10 31  ' Alk Phosphatase 38 - 126 U/L 82 105 52  Total Bilirubin 0.3 - 1.2 mg/dL 0.5 <0.2 0.5  Bilirubin, Direct 0.0 - 0.2 mg/dL 0.1 - -    Social History   Tobacco Use  Smoking Status Former Smoker  . Packs/day: 1.75  . Years: 20.00  . Pack years: 35.00  . Types: Cigarettes  . Start date: 67  . Quit date: 39  . Years since quitting: 47.3  Smokeless Tobacco Former User   BP Readings from Last 3 Encounters:  06/29/20 (!) 140/56  06/14/20 (!) 118/56  05/19/20 132/67   Pulse Readings from Last 3 Encounters:  06/29/20 75  06/14/20 75  05/19/20 76   Wt Readings from Last 3 Encounters:  06/29/20 198 lb (89.8 kg)  06/14/20 203 lb 9.6 oz (92.4 kg)  05/19/20 202 lb 12.8 oz (92 kg)    Assessment: Review of patient past medical history, allergies, medications, health status, including review of consultants reports, laboratory and other test data, was performed as part of comprehensive evaluation and provision of chronic care management services.   SDOH:  (Social Determinants of Health) assessments and interventions performed: none   CCM Care Plan  Allergies  Allergen  Reactions  . Clopidogrel Anaphylaxis and Other (See Comments)    altered mental status;  (electrolytes "out of wack" and confusion per family; THEY DO NOT [place])- MSB 10/10/15  . Ciprofloxacin Itching  . Mirtazapine Diarrhea    Bad dreams   . Spironolactone Other (See Comments)    gynecomastia    Medications Reviewed Today    Reviewed by Vella Raring, RPH-CPP (Pharmacist) on 07/05/20 at 1138  Med List Status: <None>  Medication Order Taking? Sig Documenting Provider Last Dose Status Informant  acetaminophen (TYLENOL) 500 MG tablet [id number] Yes Take 1,000 mg by mouth  every 6 (six) hours as needed for moderate pain or headache.  [provider] Taking Active Spouse/Significant Other  Alcohol Swabs (B-D SINGLE USE SWABS REGULAR) PADS [id number]  Use to check blood sugar up to 2 times daily [name], DO  Active   allopurinol (ZYLOPRIM) 100 MG tablet [id number] Yes Take 1 tablet (100 mg total) by mouth daily. Lyda Jester M, PA-C Taking Active Spouse/Significant Other  aspirin 81 MG chewable tablet [id number] Yes Chew 81 mg by mouth daily. [provider] Taking Active Spouse/Significant Other  Blood Glucose Monitoring Suppl (TRUE METRIX METER) DEVI [id number] Yes Use to check blood sugar up to 2 times daily [name], DO Taking Active   bosutinib (BOSULIF) 100 MG tablet [id number] Yes Take 100 mg by mouth at bedtime as needed (tired/not feeling well). Take with food.  [provider] Taking Active Spouse/Significant Other           Med Note Jeanie Cooks Sep 15, 2019  8:38 AM)    Cholecalciferol 25 MCG (1000 UT) capsule [id number] Yes Take by mouth. [provider] Taking Active   EASY TOUCH PEN NEEDLES 31G X 8 MM MISC [id number]  USE 1 NEEDLE DAILY WITH LEVEMIR Karamalegos, [name], DO  Active   empagliflozin (JARDIANCE) 10 MG TABS tablet [id number] Yes Take 1 tablet (10 mg total) by mouth daily before breakfast. Bensimhon, Shaune Pascal, MD Taking Active   ferrous sulfate 325 (65 FE) MG tablet [id number] Yes Take by mouth. [provider] Taking Active   gabapentin (NEURONTIN) 300 MG capsule [id number] Yes TAKE TWO CAPSULES BY MOUTH TWICE DAILY MAX DOSE = 1200MG DAILY FOR KIDNEY PROTECTION [name], DO Taking Active   hydrALAZINE (APRESOLINE) 25 MG tablet [id number] Yes Take 1 tablet (25 mg total) by mouth 3 (three) times daily. Theora Gianotti, NP Taking Expired 02/02/20 2359   Insulin Glargine ([name] KWIKPEN) 100 UNIT/ML [id number] Yes Inject 20 Units into  the skin daily. [provider] Taking Active   Insulin Pen Needle 31G X 5 MM MISC [id number]  31 g by Does not apply route as directed. [name], NP  Active Spouse/Significant Other  isosorbide mononitrate (IMDUR) 60 MG 24 hr tablet [id number] Yes Take 1 tablet (60 mg total) by mouth daily. End, [name], MD Taking Active   levothyroxine (SYNTHROID) 25 MCG tablet [id number] Yes TAKE ONE TABLET BY MOUTH EVERY MORNING BEFORE BREAKFAST Karamalegos, [name], DO Taking Active   loperamide (IMODIUM A-D) 2 MG tablet [id number] No Take by mouth.  Patient not taking: Reported on 07/05/2020   [provider] Not Taking Active   losartan (COZAAR) 25 MG tablet [id number] Yes Take 1 tablet (25 mg total) by mouth daily. Rise Mu, PA-C Taking Active   Menthol-Zinc Oxide (GOLD [place]) [id number] Yes Apply 1 application topically daily as  needed (muscle pain). [provider] Taking Active Spouse/Significant Other  metoprolol succinate (TOPROL-XL) 25 MG 24 hr tablet [id number] Yes TAKE ONE TABLET BY MOUTH ONCE DAILY End, [name], MD Taking Active   mirabegron ER (MYRBETRIQ) 25 MG TB24 tablet [id number] No Take 1 tablet (25 mg total) by mouth daily. From Urology  Patient not taking: Reported on 07/05/2020   Debroah Loop, PA-C Not Taking Active   Multiple Vitamin (MULTIVITAMIN WITH MINERALS) TABS tablet [id number] Yes Take 1 tablet by mouth daily. [provider] Taking Active Spouse/Significant Other  nitroGLYCERIN (NITROSTAT) 0.4 MG SL tablet [id number] No Place 1 tablet (0.4 mg total) under the tongue every 5 (five) minutes x 3 doses as needed for chest pain.  Patient not taking: Reported on 07/05/2020   Theora Gianotti, NP Not Taking Active   omeprazole (PRILOSEC) 20 MG capsule [id number] Yes Take 20 mg by mouth in the morning.  [provider] Taking Active Spouse/Significant Other  Polyethyl Glycol-Propyl Glycol (SYSTANE OP)  [id number]  Place 1 drop into both eyes 3 (three) times daily as needed (dry/irritated eyes.).  [provider]  Active Spouse/Significant Other  polyethylene glycol (MIRALAX / GLYCOLAX) 17 g packet [id number] Yes Take 17 g by mouth daily as needed for mild constipation.  [provider] Taking Active Spouse/Significant Other  potassium chloride (KLOR-CON) 10 MEQ tablet [id number] Yes Take 1 tablet (10 mEq total) by mouth daily. [name], DO Taking Active   ranolazine (RANEXA) 500 MG 12 hr tablet [id number] Yes TAKE ONE TABLET BY MOUTH TWICE DAILY End, [name], MD Taking Active   rosuvastatin (CRESTOR) 5 MG tablet [id number] Yes TAKE ONE TABLET BY MOUTH ONCE DAILY End, [name], MD Taking Active   simethicone (MYLICON) 80 MG chewable tablet [id number] No Chew 80 mg by mouth every 6 (six) hours as needed for flatulence.  Patient not taking: Reported on 07/05/2020   [provider] Not Taking Active   tamsulosin (FLOMAX) 0.4 MG CAPS capsule [id number] Yes Take 1 capsule (0.4 mg total) by mouth daily after supper. Rise Mu, PA-C Taking Active   torsemide (DEMADEX) 20 MG tablet [id number] Yes Take 3 tablets (60 mg total) by mouth daily. Bensimhon, Shaune Pascal, MD Taking Active   traMADol (ULTRAM) 50 MG tablet [id number] Yes Take 1-2 tablets (50-100 mg total) by mouth at bedtime as needed. [name], DO Taking Active   TRULICITY 1.5 EN/4.0HW Bonney Aid [id number] Yes Inject 1.5 mg into the skin once a week. [name], DO Taking Active            Med Note [facility], BRANDY L   Thu Jun 29, 2020  1:45 PM)    warfarin (COUMADIN) 3 MG tablet [id number] Yes TAKE 1 TO 1 & 1/2 TABLETS BY MOUTH AS DIRECTED BY THE COUMADIN CLINIC End, [name], MD Taking Active           Patient Active Problem List   Diagnosis Date Noted  . Obstructive sleep apnea 06/29/2020  . Urge incontinence of urine 12/22/2019  . Trifascicular block 10/20/2019   . Wound infection 10/08/2019  . Bifascicular block 05/13/2019  . Coronary stent patent 04/13/2019  . Chronic HFrEF (heart failure with reduced ejection fraction) ([place]) 03/19/2019  . PSVT (paroxysmal supraventricular tachycardia) ([place]) 03/19/2019  . 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 CKD (chronic kidney disease), stage IV ([place]) 06/04/2018  . Osteoarthritis of knees, bilateral 06/02/2018  . Hyperlipidemia associated with type 2  diabetes mellitus ([place]) 05/06/2018  . Pain of lower extremity 03/06/2018  . Duodenitis 01/30/2018  . Chronic heart failure with preserved ejection fraction (HFpEF) ([place]) 01/23/2018  . Hematochezia   . Melena   . Rectal bleeding 01/11/2018  . AVM (arteriovenous malformation) of colon without hemorrhage   . Schatzki's ring   . Esophageal dysphagia   . Diabetic retinopathy ([place]) 07/08/2017  . Ischemic cardiomyopathy 06/26/2017  . Cardiomyopathy ([place]) 06/26/2017  . Centrilobular emphysema ([place]) 06/13/2017  . Fatigue 12/18/2016  . Stable angina ([place]) 09/20/2016  . Stage 3a chronic kidney disease ([place]) 09/20/2016  . Paroxysmal atrial fibrillation ([place]) 08/28/2016  . Chronic anticoagulation 08/28/2016  . Essential hypertension 08/20/2016  . 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 heart with stable angina pectoris ([place]) 08/19/2016  . Accelerating angina ([place]) 08/15/2016  . Exertional dyspnea 08/12/2016  . 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 Gynecomastia, male 03/31/2014  . Hypertrophy of breast 03/31/2014  . Nuclear sclerosis of left eye 10/22/2013  . Enthesopathy of ankle and tarsus 04/10/2013  . Controlled type 2 diabetes mellitus with diabetic nephropathy ([place]) 04/09/2013  . Diabetic neuropathy associated with type 2 diabetes mellitus ([place]) 04/09/2013  . Cataract of left eye 11/25/2011  . Corneal opacity 09/30/2011  . Pseudophakia of right eye 09/30/2011  . GERD (gastroesophageal reflux disease) 09/25/2011   . Gout 09/25/2011  . Hyperlipidemia LDL goal <70 09/25/2011  . Status post cataract extraction 04/11/2011  . Cataract extraction status of eye 04/11/2011  . Iron deficiency anemia 04/01/2011  . Benign localized hyperplasia of prostate without urinary obstruction and other lower urinary tract symptoms (LUTS) 12/10/2010  . Enlarged prostate without lower urinary tract symptoms (luts) 12/10/2010  . Colon cancer screening 10/11/2010  . Hypothyroidism 10/11/2010  . Hyperlipidemia 09/20/2003  . Essential and other specified forms of tremor 01/05/2001  . Essential tremor 01/05/2001    Immunization History  Administered Date(s) Administered  . Influenza, High Dose Seasonal PF 12/29/2014, 12/19/2016, 12/22/2017  . Influenza, Seasonal, Injecte, Preservative Fre 01/13/2007, 12/13/2008, 01/30/2010, 12/26/2011  . Influenza,inj,Quad PF,6+ Mos 12/25/2012, 12/09/2015, 11/26/2018, 12/15/2019  . PFIZER(Purple Top)SARS-COV-2 Vaccination 05/13/2019, 06/08/2019, 12/15/2019  . PPD Test 09/25/2010  . Pneumococcal Conjugate-13 01/31/2014  . Pneumococcal Polysaccharide-23 06/20/2008, 09/02/2010    Conditions to be addressed/monitored: HTN, HLD and DMII  Care Plan : PharmD - Medication Management/Assistance  Updates made by Vella Raring, RPH-CPP since 07/05/2020 12:00 AM    Problem: Disease Progression     Long-Range Goal: Disease Progression Prevented or Minimized   Start Date: 04/14/2020  Expected End Date: 07/13/2020  This Visit's Progress: On track  Recent Progress: On track  Priority: High  Note:   Current Barriers:  . Chronic Disease Management support, education, and care coordination needs related to type 2 diabetes, CKD, HFrEF s/p CardioMEMS, hyperlipidemia, atrial fibrillation, hypertension, coronary artery disease, CHF, hypothyroidism, and CML . Financial o Note patient has Partial Extra Help/LIS Subsidy o Patient enrolled in [employer] health practitioner and Trulicity) patient assistance  program for 2022 calendar year o Patient denied for patient assistance for Lorenso Quarry from [employer] for 2022 calendar year - Patient on waitlist for [facility] . Limited vision  Pharmacist Clinical Goal(s):  [name] Over the next 90 days, patient will verbalize ability to afford treatment regimen through collaboration with PharmD and provider.  .   Interventions: . 1:1 collaboration with [name], DO regarding development and update of comprehensive plan of care as evidenced by provider attestation and co-signature .  Inter-disciplinary care team collaboration (see longitudinal plan of care) . Perform chart review o Patient seen for Office Visit with PCP on 3/4 for loss of balance. Provider advised: - Start course of Augmentin for sinusitis - Follow up regarding sleep study - Reduce potassium dose to 94mq daily - Patient to obtain wider base cane such as a 3-4 prong cane - [facility] Patient seen for Office Visit with PCP on 3/30 for T2DM follow up - A1C: 7.1% - Provider advised patient to: .Marland KitchenContinue Gabapentin 6034mBID and tramadol as needed for Neuropathy  . Reduce potassium dose to 1062mdaily . Order for diabetic shoes faxed o Patient seen for Office Visits with Podiatry on 3/28 and 4/11 o Patient seen for Office Visit with Cardiology on 4/14 o Note patient scheduled for appointment for Sleep Consult/CPAP on 4/27 . Today patient and wife report patient attending and doing well with [facility] . Report patient obtained diabetic shoes as ordered by PCP and using 3-prong cane . Reports a friend has installed a railing to go up the back steps of their home . Reports has shower and bathroom railings . Comprehensive medication review performed; medication list updated in electronic medical record o Reports patient taking potassium 16m35maily as directed by PCP o Caution for risk of dizziness with  tramadol. Reports patient using only as needed at bedtime for neuropathy flares as directed  Coordination of care: . PaMarland Kitchenient not currently taking Myrbetriq. From review of chart, note [place] and provider advised patient continue trial of Myrtetriq and recheck after 12 weeks of continuous therapy. Patient missed appointment with Urology on 1/24.  o Provide patient's wife with phone number and encourage to call to reschedule follow up with Urology  Type 2 Diabetes . Controlled; current treatment: o [name] 20 units daily o Trulicity 1.5 mg once weekly  o Jardiance 10 mg once daily in morning . Patient reports recent AM CBGs readings ranging 110s-150s; reports this morning CBG: 137 . Denies episodse of hypoglycemia  Medication Assistance  Confirms patient currently [facility] for [employer] patient assistance for Jardiance for 2022 calendar year based on copayment amount/household income  Heart Failure Clinic providing patient with samples of Jardiance while patient on [facility] [place] [facility] for heart failure fund.  ? Remind patient's wife to [place] [facility] [place]; confirms she has phone number for clinic.   Patient Goals/Self-Care Activities . Over the next 90 days, patient will:  . Check blood sugar, blood pressure and fluid status as directed by providers  . Take medications, with assistance of wife, as directed o Mrs. Matteucci manages patient's medications using weekly pillbox as adherence tool . Attends scheduled medical appointments o Appointment for Sleep Consult/CPAP on 4/27  Follow Up Plan: Telephone follow up appointment with care management team member scheduled for: 08/14/2020 at 11:15 AM      Medication Assistance:See above  Patient's preferred  pharmacy is:  [place]: [phone]: 336-[place] [address] [place]: [phone]: [place]  Del[place] - [place], [place] [address] Phone: [phone] Fax: [phone]  [facility] [place], [place] - [address] [place] [address] Phone: [phone] Fax: [phone]  Uses pill box? Yes  Follow Up:  Patient agrees to Care Plan and Follow-up.  Plan: Telephone follow up appointment with care management team member scheduled for:  08/14/2020 at 11:15 AM  [name], PharmD, Para [place], [place] [phone]

## 2020-07-11 NOTE — Progress Notes (Deleted)
@Patient  ID: Lucas Caldwell, male    DOB: [birth year], 87 y.o.   MRN: [id number]  No chief complaint on file.   Referring provider: [name], MD  HPI: 87 year old male, former smoker quit 1975 (35-pack-year history).  Past medical history significant for chronic respiratory failure with hypoxia, excessive daytime sleepiness.  Patient of Dr. Mortimer Fries  07/12/2020 Patient was recently diagnosed with obstructive sleep apnea. PSG sleep study completed on 04/19/2020 which showed moderate obstructive sleep apnea, total apnea hypopnea index (AHI) was 15.3/hr. REM related AHI of 47/hr. He was ordered for CPAP titration study in [place] which has not yet been completed.      Allergies  Allergen Reactions  . Clopidogrel Anaphylaxis and Other (See Comments)    altered mental status;  (electrolytes "out of wack" and confusion per family; THEY DO NOT [place])- MSB 10/10/15  . Ciprofloxacin Itching  . Mirtazapine Diarrhea    Bad dreams   . Spironolactone Other (See Comments)    gynecomastia    Immunization History  Administered Date(s) Administered  . Influenza, High Dose Seasonal PF 12/29/2014, 12/19/2016, 12/22/2017  . Influenza, Seasonal, Injecte, Preservative Fre 01/13/2007, 12/13/2008, 01/30/2010, 12/26/2011  . Influenza,inj,Quad PF,6+ Mos 12/25/2012, 12/09/2015, 11/26/2018, 12/15/2019  . PFIZER(Purple Top)SARS-COV-2 Vaccination 05/13/2019, 06/08/2019, 12/15/2019  . PPD Test 09/25/2010  . Pneumococcal Conjugate-13 01/31/2014  . Pneumococcal Polysaccharide-23 06/20/2008, 09/02/2010    Past Medical History:  Diagnosis Date  . Arthritis    "probably in his legs" (08/15/2016)  . Blind right eye   . BPH (benign prostatic hyperplasia)   . Breast asymmetry    Left breast is larger, present for several years.  [name] CAD (coronary artery disease)    a. Cath in the late 90's - reportedly ok;  b. 2014 s/p stenting x 2 @ [facility]; c 08/19/16 Cath/PCI with DES -> RCA, plan  to treat LM 70% medically. Seen by surgery and felt to be too high risk for CABG; d. 08/2018 Cath: LM 70 (iFR 0.86), LAD 20ost, 40p, 50/35m, D1 70, LCX patent stent, OM1 30, RCA patent stent, 110m ISR, 40d, RPDA 30. PCWP 8. CO/CI 4.0/2.1; e. 03/2019 PCI to LAD (2.5x15 Resolute Onyx DES).  . Chronic combined systolic (congestive) and diastolic (congestive) heart failure ([place])    a. Previously reduced EF-->50% by echo in 2012;  b. 06/2015 Echo: EF 50-55%  c. 07/2016 Echo: EF 45-50%; d. 12/2017 Echo: EF 55%, Gr1 DD, mild MR; e. 08/2018 Echo: EF 35-40%, mildly dil PA; f. 04/2019 Echo: EF 30-35%, glob HK, nl RV fxn; g. s/p Cardiomems.  . Chronic kidney disease (CKD), stage IV (severe) ([place])   . CML (chronic myelocytic leukemia) ([place])   . GERD (gastroesophageal reflux disease)   . GIB (gastrointestinal bleeding)    a. 12/2017 3 unit PRBC GIB in setting of coumadin-->Endo/colon multiple duodenal ulcers and a single bleeding ulcer in the proximal ascending colon status post hemostatic clipping x2.  . Gout   . Hyperlipemia   . Hypertension   . Hypothyroidism   . Iron deficiency anemia   . Ischemic cardiomyopathy    a. Previously reduced EF-->50% by echo in 2012;  b. 06/2015 Echo: EF 50-55%, Gr2 DD, mild MR, mildly dil LA, Ao sclerosis, mild TR;  c. 07/2016 Echo: EF 45-50%; d. 12/2017 Echo: EF 55%, Gr1 DD, mild MR; e. 08/2018 Echo: EF 35-40%; f. 04/2019 Echo: EF 30-35%.  . Migraine    "in the 1960s" (08/15/2016)  . Obstructive sleep apnea   .  PAF (paroxysmal atrial fibrillation) ([facility])    a. ? Dx 2014-->s/p DCCV;  b. CHA2DS2VASc = 6--> Coumadin.  . Prostate cancer ([place])   . RBBB   . Type II diabetes mellitus ([facility])     Tobacco History: Social History   Tobacco Use  Smoking Status Former Smoker  . Packs/day: 1.75  . Years: 20.00  . Pack years: 35.00  . Types: Cigarettes  . Start date: 20  . Quit date: 89  . Years since quitting: 39.3  Smokeless Tobacco Former Air traffic controller given: Not  Answered   Outpatient Medications Prior to Visit  Medication Sig Dispense Refill  . acetaminophen (TYLENOL) 500 MG tablet Take 1,000 mg by mouth every 6 (six) hours as needed for moderate pain or headache.     . Alcohol Swabs (B-D SINGLE USE SWABS REGULAR) PADS Use to check blood sugar up to 2 times daily 200 each 3  . allopurinol (ZYLOPRIM) 100 MG tablet Take 1 tablet (100 mg total) by mouth daily. 90 tablet 3  . aspirin 81 MG chewable tablet Chew 81 mg by mouth daily.    . Blood Glucose Monitoring Suppl (TRUE METRIX METER) DEVI Use to check blood sugar up to 2 times daily 1 each 0  . bosutinib (BOSULIF) 100 MG tablet Take 100 mg by mouth at bedtime as needed (tired/not feeling well). Take with food.     . Cholecalciferol 25 MCG (1000 UT) capsule Take by mouth.    [name] EASY TOUCH PEN NEEDLES 31G X 8 MM MISC USE 1 NEEDLE DAILY WITH LEVEMIR 100 each 2  . empagliflozin (JARDIANCE) 10 MG TABS tablet Take 1 tablet (10 mg total) by mouth daily before breakfast. 30 tablet 1  . ferrous sulfate 325 (65 FE) MG tablet Take by mouth.    . gabapentin (NEURONTIN) 300 MG capsule TAKE TWO CAPSULES BY MOUTH TWICE DAILY MAX DOSE = 1200MG  DAILY FOR KIDNEY PROTECTION 120 capsule 0  . hydrALAZINE (APRESOLINE) 25 MG tablet Take 1 tablet (25 mg total) by mouth 3 (three) times daily. 270 tablet 3  . Insulin Glargine ([name] KWIKPEN) 100 UNIT/ML Inject 20 Units into the skin daily.    . Insulin Pen Needle 31G X 5 MM MISC 31 g by Does not apply route as directed. 100 each 5  . isosorbide mononitrate (IMDUR) 60 MG 24 hr tablet Take 1 tablet (60 mg total) by mouth daily. 135 tablet 2  . levothyroxine (SYNTHROID) 25 MCG tablet TAKE ONE TABLET BY MOUTH EVERY MORNING BEFORE BREAKFAST 90 tablet 0  . loperamide (IMODIUM A-D) 2 MG tablet Take by mouth. (Patient not taking: Reported on 07/05/2020)    . losartan (COZAAR) 25 MG tablet Take 1 tablet (25 mg total) by mouth daily. 90 tablet 3  . Menthol-Zinc Oxide (GOLD BOND EX)  Apply 1 application topically daily as needed (muscle pain).    . metoprolol succinate (TOPROL-XL) 25 MG 24 hr tablet TAKE ONE TABLET BY MOUTH ONCE DAILY 30 tablet 2  . mirabegron ER (MYRBETRIQ) 25 MG TB24 tablet Take 1 tablet (25 mg total) by mouth daily. From Urology (Patient not taking: Reported on 07/05/2020) 30 tablet 1  . Multiple Vitamin (MULTIVITAMIN WITH MINERALS) TABS tablet Take 1 tablet by mouth daily.    . nitroGLYCERIN (NITROSTAT) 0.4 MG SL tablet Place 1 tablet (0.4 mg total) under the tongue every 5 (five) minutes x 3 doses as needed for chest pain. (Patient not taking: Reported on 07/05/2020) 25 tablet 4  .  omeprazole (PRILOSEC) 20 MG capsule Take 20 mg by mouth in the morning.     [name] Place 1 drop into both eyes 3 (three) times daily as needed (dry/irritated eyes.).     [name] polyethylene glycol (MIRALAX / GLYCOLAX) 17 g packet Take 17 g by mouth daily as needed for mild constipation.     . potassium chloride (KLOR-CON) 10 MEQ tablet Take 1 tablet (10 mEq total) by mouth daily. 90 tablet 1  . ranolazine (RANEXA) 500 MG 12 hr tablet TAKE ONE TABLET BY MOUTH TWICE DAILY 180 tablet 2  . rosuvastatin (CRESTOR) 5 MG tablet TAKE ONE TABLET BY MOUTH ONCE DAILY 90 tablet 0  . simethicone (MYLICON) 80 MG chewable tablet Chew 80 mg by mouth every 6 (six) hours as needed for flatulence. (Patient not taking: Reported on 07/05/2020)    . tamsulosin (FLOMAX) 0.4 MG CAPS capsule Take 1 capsule (0.4 mg total) by mouth daily after supper. 30 capsule 11  . torsemide (DEMADEX) 20 MG tablet Take 3 tablets (60 mg total) by mouth daily. 90 tablet 6  . traMADol (ULTRAM) 50 MG tablet Take 1-2 tablets (50-100 mg total) by mouth at bedtime as needed. 30 tablet 2  . TRULICITY 1.5 BW/4.6KZ SOPN Inject 1.5 mg into the skin once a week. 6 mL 3  . warfarin (COUMADIN) 3 MG tablet TAKE 1 TO 1 & 1/2 TABLETS BY MOUTH AS DIRECTED BY THE COUMADIN CLINIC 120 tablet 0   No  facility-administered medications prior to visit.      Review of Systems  Review of Systems   Physical Exam  There were no vitals taken for this visit. Physical Exam   Lab Results:  CBC    Component Value Date/Time   WBC 7.0 06/29/2020 1411   WBC 6.8 11/04/2019 1048   RBC 4.68 06/29/2020 1411   RBC 4.48 11/04/2019 1048   HGB 13.2 06/29/2020 1411   HCT 39.4 06/29/2020 1411   PLT 182 06/29/2020 1411   MCV 84 06/29/2020 1411   MCV 92 05/18/2012 0440   MCH 28.2 06/29/2020 1411   MCH 26.6 11/04/2019 1048   MCHC 33.5 06/29/2020 1411   MCHC 29.8 (L) 11/04/2019 1048   RDW 15.6 (H) 06/29/2020 1411   RDW 15.3 (H) 05/18/2012 0440   LYMPHSABS 1.8 08/23/2019 1544   LYMPHSABS 2.4 03/16/2012 0932   MONOABS 0.8 04/28/2019 1731   MONOABS 1.6 (H) 03/16/2012 0932   EOSABS 0.2 08/23/2019 1544   EOSABS 0.1 03/16/2012 0932   BASOSABS 0.1 08/23/2019 1544   BASOSABS 4 05/18/2012 0440   BASOSABS 0.0 03/16/2012 0932    BMET    Component Value Date/Time   NA 134 06/29/2020 1411   NA 134 (L) 05/18/2012 0440   K 4.6 06/29/2020 1411   K 4.2 05/18/2012 0440   CL 100 06/29/2020 1411   CL 103 05/18/2012 0440   CO2 17 (L) 06/29/2020 1411   CO2 25 05/18/2012 0440   GLUCOSE 156 (H) 06/29/2020 1411   GLUCOSE 161 (H) 04/13/2020 1117   GLUCOSE 220 (H) 05/18/2012 0440   BUN 22 06/29/2020 1411   BUN 38 (H) 05/18/2012 0440   CREATININE 1.55 (H) 06/29/2020 1411   CREATININE 1.72 (H) 04/13/2020 1117   CALCIUM 9.2 06/29/2020 1411   CALCIUM 8.4 (L) 05/18/2012 0440   GFRNONAA 36 (L) 04/13/2020 1117   GFRAA 41 (L) 04/13/2020 1117    BNP    Component Value Date/Time   BNP 94.1  02/03/2020 0847    ProBNP No results found for: PROBNP  Imaging: No results found.   Assessment & Plan:   No problem-specific Assessment & Plan notes found for this encounter.     Martyn Ehrich, NP 07/11/2020

## 2020-07-12 ENCOUNTER — Ambulatory Visit: Payer: Medicare HMO

## 2020-07-12 ENCOUNTER — Institutional Professional Consult (permissible substitution): Payer: Medicare HMO | Admitting: Primary Care

## 2020-07-12 ENCOUNTER — Other Ambulatory Visit: Payer: Self-pay

## 2020-07-12 DIAGNOSIS — R2681 Unsteadiness on feet: Secondary | ICD-10-CM

## 2020-07-12 DIAGNOSIS — R278 Other lack of coordination: Secondary | ICD-10-CM | POA: Diagnosis not present

## 2020-07-12 DIAGNOSIS — M6281 Muscle weakness (generalized): Secondary | ICD-10-CM | POA: Diagnosis not present

## 2020-07-12 DIAGNOSIS — R2689 Other abnormalities of gait and mobility: Secondary | ICD-10-CM | POA: Diagnosis not present

## 2020-07-12 NOTE — Therapy (Signed)
[place] [facility] SERVICES [address] [place], [place], [address] Phone: [phone]   Fax:  [phone]  Physical Therapy Treatment  Patient Details  Name: Lucas Caldwell MRN: [id number] Date of Birth: [birth year] Referring Provider (PT): [name], DO   Encounter Date: 07/12/2020   PT End of Session - 07/12/20 1449    Visit Number 8    Number of Visits 25    Date for PT Re-Evaluation 08/29/20    Authorization Time Period eval performed on 06/06/2020    PT Start Time 1350    PT Stop Time 1431    PT Time Calculation (min) 41 min    Equipment Utilized During Treatment Gait belt    Activity Tolerance Patient tolerated treatment well    Behavior During Therapy [facility] for tasks assessed/performed           Past Medical History:  Diagnosis Date  . Arthritis    "probably in his legs" (08/15/2016)  . Blind right eye   . BPH (benign prostatic hyperplasia)   . Breast asymmetry    Left breast is larger, present for several years.  [name] CAD (coronary artery disease)    a. Cath in the late 90's - reportedly ok;  b. 2014 s/p stenting x 2 @ [facility]; c 08/19/16 Cath/PCI with DES -> RCA, plan to treat LM 70% medically. Seen by surgery and felt to be too high risk for CABG; d. 08/2018 Cath: LM 70 (iFR 0.86), LAD 20ost, 40p, 50/35m, D1 70, LCX patent stent, OM1 30, RCA patent stent, 50m ISR, 40d, RPDA 30. PCWP 8. CO/CI 4.0/2.1; e. 03/2019 PCI to LAD (2.5x15 Resolute Onyx DES).  . Chronic combined systolic (congestive) and diastolic (congestive) heart failure ([place])    a. Previously reduced EF-->50% by echo in 2012;  b. 06/2015 Echo: EF 50-55%  c. 07/2016 Echo: EF 45-50%; d. 12/2017 Echo: EF 55%, Gr1 DD, mild MR; e. 08/2018 Echo: EF 35-40%, mildly dil PA; f. 04/2019 Echo: EF 30-35%, glob HK, nl RV fxn; g. s/p Cardiomems.  . Chronic kidney disease (CKD), stage IV (severe) ([place])   . CML (chronic myelocytic leukemia) ([place])   . GERD (gastroesophageal reflux  disease)   . GIB (gastrointestinal bleeding)    a. 12/2017 3 unit PRBC GIB in setting of coumadin-->Endo/colon multiple duodenal ulcers and a single bleeding ulcer in the proximal ascending colon status post hemostatic clipping x2.  . Gout   . Hyperlipemia   . Hypertension   . Hypothyroidism   . Iron deficiency anemia   . Ischemic cardiomyopathy    a. Previously reduced EF-->50% by echo in 2012;  b. 06/2015 Echo: EF 50-55%, Gr2 DD, mild MR, mildly dil LA, Ao sclerosis, mild TR;  c. 07/2016 Echo: EF 45-50%; d. 12/2017 Echo: EF 55%, Gr1 DD, mild MR; e. 08/2018 Echo: EF 35-40%; f. 04/2019 Echo: EF 30-35%.  . Migraine    "in the 1960s" (08/15/2016)  . Obstructive sleep apnea   . PAF (paroxysmal atrial fibrillation) (HCC)    a. ? Dx 2014-->s/p DCCV;  b. CHA2DS2VASc = 6--> Coumadin.  . Prostate cancer ([place])   . RBBB   . Type II diabetes mellitus ([place])     Past Surgical History:  Procedure Laterality Date  . CATARACT EXTRACTION W/ INTRAOCULAR LENS  IMPLANT, BILATERAL Bilateral   . COLONOSCOPY N/A 01/13/2018   Procedure: COLONOSCOPY;  Surgeon: Lin Landsman, MD;  Location[facility] ENDOSCOPY;  Service: Gastroenterology;  Laterality: N/A;  .  COLONOSCOPY WITH PROPOFOL N/A 01/01/2018   Procedure: COLONOSCOPY WITH PROPOFOL;  Surgeon: Lin Landsman, MD;  Location: [facility] ENDOSCOPY;  Service: Gastroenterology;  Laterality: N/A;  . CORONARY ANGIOPLASTY WITH STENT PLACEMENT  09/2012   2 stents  . CORONARY STENT INTERVENTION N/A 08/19/2016   Procedure: Coronary Stent Intervention;  Surgeon: [name], MD;  Location: [place] CV LAB;  Service: Cardiovascular;  Laterality: N/A;  . CORONARY STENT INTERVENTION N/A 04/13/2019   Procedure: CORONARY STENT INTERVENTION;  Surgeon: [name], MD;  Location: [place] CV LAB;  Service: Cardiovascular;  Laterality: N/A;  . ESOPHAGEAL MANOMETRY N/A 12/16/2017   Procedure: ESOPHAGEAL MANOMETRY (EM);  Surgeon: Lin Landsman, MD;  Location:  [facility] ENDOSCOPY;  Service: Gastroenterology;  Laterality: N/A;  . ESOPHAGOGASTRODUODENOSCOPY N/A 12/20/2016   Procedure: ESOPHAGOGASTRODUODENOSCOPY (EGD);  Surgeon: Lin Landsman, MD;  Location: [facility] ENDOSCOPY;  Service: Gastroenterology;  Laterality: N/A;  . ESOPHAGOGASTRODUODENOSCOPY N/A 01/13/2018   Procedure: ESOPHAGOGASTRODUODENOSCOPY (EGD);  Surgeon: Lin Landsman, MD;  Location: [facility] ENDOSCOPY;  Service: Gastroenterology;  Laterality: N/A;  . ESOPHAGOGASTRODUODENOSCOPY (EGD) WITH PROPOFOL N/A 11/03/2017   Procedure: ESOPHAGOGASTRODUODENOSCOPY (EGD) WITH PROPOFOL;  Surgeon: Lin Landsman, MD;  Location: [facility] ENDOSCOPY;  Service: Gastroenterology;  Laterality: N/A;  . EYE SURGERY    . HERNIA REPAIR     "navel"  . LEFT HEART CATH AND CORONARY ANGIOGRAPHY N/A 08/14/2016   Procedure: Left Heart Cath and Coronary Angiography;  Surgeon: [name], MD;  Location: [place] CV LAB;  Service: Cardiovascular;  Laterality: N/A;  . PRESSURE SENSOR/CARDIOMEMS N/A 09/23/2019   Procedure: PRESSURE SENSOR/CARDIOMEMS;  Surgeon: Jolaine Artist, MD;  Location: [place] CV LAB;  Service: Cardiovascular;  Laterality: N/A;  . RIGHT HEART CATH N/A 09/23/2019   Procedure: RIGHT HEART CATH;  Surgeon: Jolaine Artist, MD;  Location: [place] CV LAB;  Service: Cardiovascular;  Laterality: N/A;  . RIGHT/LEFT HEART CATH AND CORONARY ANGIOGRAPHY N/A 09/01/2018   Procedure: RIGHT/LEFT HEART CATH AND CORONARY ANGIOGRAPHY;  Surgeon: [name], MD;  Location: [place] CV LAB;  Service: Cardiovascular;  Laterality: N/A;  . RIGHT/LEFT HEART CATH AND CORONARY ANGIOGRAPHY N/A 04/13/2019   Procedure: RIGHT/LEFT HEART CATH AND CORONARY ANGIOGRAPHY;  Surgeon: [name], MD;  Location: [place] CV LAB;  Service: Cardiovascular;  Laterality: N/A;  . TOE AMPUTATION Right    "big toe"    There were no vitals filed for this visit.   Subjective Assessment - 07/12/20 1443     Subjective Patient had a doctor appointment today. Will be getting a call monday about his sleep study. No falls or LOB since last session.    Patient is accompained by: Family member    Pertinent History Pt is an 87 y/o male presenting to physical therapy today with spouse with reports of difficulty with balance, dizziness, and walking. Pt states he started feeling "swimmy headed" approximately one year ago and that it is affecting his balance with walking and has been progressively worsening. Pt reports he feels he is moving when he's still. He denies spinning sensation. He reports he sometimes has headache but says "they are not that bad." Pt reports feeling SOB with walking and household tasks (his doctor is aware). Pt denies unintentional weight gain or loss. Pt reports 1 fall the last 6 months. Per spouse pt tripped at time of fall and do not provide further details. Pt with extensive PMH. Relevant PMH per chart: Exertional dyspnea, centrilobular emphysema, angina, CAD, acute on chronic HFrEF, controlled  DMII with diabetic nephropathy, diabetic neuropathy,  gout, CKD, essential HTN, paroxysmal atrial fibrillation, chronic anticoagulation, CKD, fatigue, iron deficiency anemia, pain of LE, OA of B knees, hx of partial ray amputation of R great toe, s/p cataract extraction status of eye, enthesopathy of ankle and tarsus, GERD, hyperlipidemia, hypothyroidism, nuclear sclerosis of L eye, psedophakia of R eye, ischemic cardiomyopathy, esophageal dysphagia, chronic heart failure with preserved ejection fraction, PSVT, coronary stent patent, bifascicular block, trifascicular block, wound infection, urge incontinence of urine.    Limitations House hold activities;Lifting;Standing;Walking;Writing   Pt reports when he tries to write his hand shakes a lot   How long can you sit comfortably? not limited    How long can you stand comfortably? 5 minutes    How long can you walk comfortably? 10 minutes    Diagnostic  tests DG Chest 2 View  from 07/06/2019 per chart: "  IMPRESSION:  No acute abnormalities."    Patient Stated Goals Feel more steady with walking    Currently in Pain? No/denies                  TREATMENT   NEURO RE-ED: CGA-min assist for all of the following   Standing with CGA next to support surface:  Airex pad: static stand 30 seconds x 2 trials, noticeable trembling of ankles/LE's with fatigue and challenge to maintain stability; occasional severe posterior LOB  Airex pad: horizontal head turns 30 seconds scanning room 10x ; cueing for arc of motion very challenging  Airex pad: vertical head turns 30 seconds, cueing for arc of motion, noticeable sway with upward gaze increasing demand on ankle righting reaction musculature; multiple near LOB  Airex pad: one foot on 6" step one foot on airex pad, hold position for 30 seconds, switch legs, 2x each LE; airex pad 6" step toe taps with SUE support 10x each LE airex balance beam: lateral stepping with decreased UE support to no UE support x8 lengths of // bars    Hedgehog toe taps 3x10 each LE; intermittent UE support, pt required up to min assist d/t LOB to regain balance   Hedgehog cone taps with UUE support to one finger support on bar with forward stepping and cross-stepping x multiple reps each LE; frequent VC cuing provided     SLB on even surface 2x30 sec each LE;with finger tip support    Education provided throughout in the form of demo, VC/TC to facilitate movement at target joints and correct muscle activation with exercises. Pt demonstrates good carryover within session with cues.  TherEx: STS 10x with cues for sequencing and body mechanics for improved control and safety awarness Small ball adduction 10x 3 second holds Small ball between knees; ER/IR 12x each LE    Assessment:  Patient initially is challenged with maintaining upright positioning with pertubation's from unstable surface and/or head movements however  by the end of session is able to self correct with decreased assistance from PT to retain COM. [name] highly motivated throughout session and his vitals remain within functional range despite fatigue. The pt will benefit from further skilled PT to improve BLE strength and balance to decrease fall risk.                          PT Education - 07/12/20 1449    Education Details exercise technique, body mechanics    Person(s) Educated Patient    Methods Explanation;Demonstration;Tactile cues;Verbal cues  Comprehension Verbalized understanding;Returned demonstration;Verbal cues required;Tactile cues required            PT Short Term Goals - 06/07/20 0811      PT SHORT TERM GOAL #1   Title Patient will be independent in home exercise program to improve strength/mobility for better functional independence with ADLs.    Baseline 06/06/2020: HEP to be initiated next visit    Time 6    Period Weeks    Status New    Target Date 07/18/20             PT Long Term Goals - 06/13/20 1713      PT LONG TERM GOAL #1   Title Patient will increase FOTO score to equal to or greater than 54 to demonstrate statistically significant improvement in mobility and quality of life.    Baseline 06/06/2020: 46 (risk-adjusted 45)    Time 12    Period Weeks    Status New    Target Date 08/29/20      PT LONG TERM GOAL #2   Title Patient will increase 10 meter walk test to >1.14m/s as to improve gait speed for better community ambulation and to reduce fall risk.    Baseline 06/06/2020: 0.59 m/s with SPC    Time 12    Period Weeks    Status New    Target Date 08/29/20      PT LONG TERM GOAL #3   Title Patient will increase Berg Balance score by > 6 points to demonstrate decreased fall risk during functional activities.    Baseline 06/06/2020: 40/56    Time 12    Period Weeks    Status New    Target Date 08/29/20      PT LONG TERM GOAL #4   Title Patient (> 67 years old) will  complete five times sit to stand test in < 15 seconds indicating an increased LE strength and improved balance.    Baseline 06/06/2020: 18 sec performed with BUE assist off chair    Time 12    Period Weeks    Status New    Target Date 08/29/20      PT LONG TERM GOAL #5   Title Patient will reduce dizziness handicap inventory score by at least 18 points, for less dizziness with ADLs and increased safety with home and work tasks.    Baseline 06/06/2020: 48% (moderate handicap)    Time 12    Period Weeks    Status New    Target Date 08/29/20      Additional Long Term Goals   Additional Long Term Goals Yes      PT LONG TERM GOAL #6   Title Patient will increase Functional Gait Assessment score to >20/30 as to reduce fall risk and improve dynamic gait safety with community ambulation.    Baseline 06/13/2020: 15/30    Time 11    Period Weeks    Status New    Target Date 08/29/20                 Plan - 07/12/20 1450    Clinical Impression Statement Patient initially is challenged with maintaining upright positioning with pertubation's from unstable surface and/or head movements however by the end of session is able to self correct with decreased assistance from PT to retain COM. [name] highly motivated throughout session and his vitals remain within functional range despite fatigue. The pt will benefit from further skilled PT to improve  BLE strength and balance to decrease fall risk.    Personal Factors and Comorbidities Age;Comorbidity 3+;Fitness;Comorbidity 1;Comorbidity 2;Past/Current Experience;Time since onset of injury/illness/exacerbation    Comorbidities Pertinent PMH/comorbidities per chart: Exertional dyspnea, centrilobular emphysema, angina, CAD, acute on chronic HFrEF, controlled DMII with diabetic nephropathy, diabetic neuropathy, gout, CKD, essential HTN, paroxysmal atrial fibrillation, chronic anticoagulation, CKD, fatigue, iron deficiency anemia, pain of LE, OA of B knees,  hx of partial ray amputation of R great toe, s/p cataract extraction status of eye, enthesopathy of ankle and tarsus, GERD, hyperlipidemia, hypothyroidism, nuclear sclerosis of L eye, psedophakia of R eye, ischemic cardiomyopathy, esophageal dysphagia, chronic heart failure with preserved ejection fraction, PSVT, coronary stent patent, bifascicular block, trifascicular block, wound infection, urge incontinence of urine.    Examination-Activity Limitations Bathing;Bed Mobility;Bend;Caring for Others;Carry;Continence;Dressing;Hygiene/Grooming;Stairs;Squat;Reach Overhead;Locomotion Level;Lift;Stand;Toileting;Transfers    Examination-Participation Restrictions Medication Management;Cleaning;Meal Prep;Community Activity;Driving;Laundry;Shop;Yard Work;Volunteer    Stability/Clinical Decision Making Evolving/Moderate complexity    Rehab Potential Good    PT Frequency 2x / week    PT Duration 12 weeks    PT Treatment/Interventions ADLs/Self Care Home Management;Biofeedback;Canalith Repostioning;Cryotherapy;Electrical Stimulation;Moist Heat;Traction;Ultrasound;DME Instruction;Gait training;Stair training;Functional mobility training;Therapeutic activities;Therapeutic exercise;Balance training;Neuromuscular re-education;Patient/family education;Orthotic Fit/Training;Manual techniques;Wheelchair mobility training;Passive range of motion;Dry needling;Energy conservation;Taping;Vestibular;Joint Manipulations;Spinal Manipulations;Aquatic Therapy    PT Next Visit Plan initiate balance exercises, TUG, FGA, progress LE strengthening, foot clearance exercises, VORx1, hip abudction/extension, nustep, dynamic balance exercises    Consulted and Agree with Plan of Care Patient;Family member/caregiver    Family Member Consulted spouse           Patient will benefit from skilled therapeutic intervention in order to improve the following deficits and impairments:  Abnormal gait,Dizziness,Increased fascial  restricitons,Impaired sensation,Improper body mechanics,Pain,Cardiopulmonary status limiting activity,Decreased coordination,Decreased mobility,Postural dysfunction,Decreased activity tolerance,Decreased endurance,Decreased range of motion,Hypomobility,Decreased strength,Decreased balance,Difficulty walking,Impaired flexibility  Visit Diagnosis: Unsteadiness on feet  Other abnormalities of gait and mobility  Muscle weakness (generalized)     Problem List Patient Active Problem List   Diagnosis Date Noted  . Obstructive sleep apnea 06/29/2020  . Urge incontinence of urine 12/22/2019  . Trifascicular block 10/20/2019  . Wound infection 10/08/2019  . Bifascicular block 05/13/2019  . Coronary stent patent 04/13/2019  . Chronic HFrEF (heart failure with reduced ejection fraction) ([place]) 03/19/2019  . PSVT (paroxysmal supraventricular tachycardia) ([place]) 03/19/2019  . 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 ([place]) 08/24/2018  . CKD (chronic kidney disease), stage IV ([place]) 06/04/2018  . Osteoarthritis of knees, bilateral 06/02/2018  . Hyperlipidemia associated with type 2 diabetes mellitus ([place]) 05/06/2018  . Pain of lower extremity 03/06/2018  . Duodenitis 01/30/2018  . Chronic heart failure with preserved ejection fraction (HFpEF) ([place]) 01/23/2018  . Hematochezia   . Melena   . Rectal bleeding 01/11/2018  . AVM (arteriovenous malformation) of colon without hemorrhage   . Schatzki's ring   . Esophageal dysphagia   . Diabetic retinopathy ([place]) 07/08/2017  . Ischemic cardiomyopathy 06/26/2017  . Cardiomyopathy ([place]) 06/26/2017  . Centrilobular emphysema ([place]) 06/13/2017  . Fatigue 12/18/2016  . Stable angina ([place]) 09/20/2016  . Stage 3a chronic kidney disease ([place]) 09/20/2016  . Paroxysmal atrial fibrillation ([place]) 08/28/2016  . Chronic anticoagulation 08/28/2016  . Essential hypertension 08/20/2016  . 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 heart with  stable angina pectoris ([place]) 08/19/2016  . Accelerating angina ([place]) 08/15/2016  . Exertional dyspnea 08/12/2016  . 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[place]) 12/29/2015  . Gynecomastia, male 03/31/2014  . Hypertrophy of breast 03/31/2014  . Nuclear sclerosis of  left eye 10/22/2013  . Enthesopathy of ankle and tarsus 04/10/2013  . Controlled type 2 diabetes mellitus with diabetic nephropathy ([place]) 04/09/2013  . Diabetic neuropathy associated with type 2 diabetes mellitus ([place]) 04/09/2013  . Cataract of left eye 11/25/2011  . Corneal opacity 09/30/2011  . Pseudophakia of right eye 09/30/2011  . GERD (gastroesophageal reflux disease) 09/25/2011  . Gout 09/25/2011  . Hyperlipidemia LDL goal <70 09/25/2011  . Status post cataract extraction 04/11/2011  . Cataract extraction status of eye 04/11/2011  . Iron deficiency anemia 04/01/2011  . Benign localized hyperplasia of prostate without urinary obstruction and other lower urinary tract symptoms (LUTS) 12/10/2010  . Enlarged prostate without lower urinary tract symptoms (luts) 12/10/2010  . Colon cancer screening 10/11/2010  . Hypothyroidism 10/11/2010  . Hyperlipidemia 09/20/2003  . Essential and other specified forms of tremor 01/05/2001  . Essential tremor 01/05/2001   Janna Arch, PT, DPT    07/12/2020, 2:51 PM  [place] [facility] SERVICES [address] [place], [place], [address] Phone: [phone]   Fax:  [phone]  Name: Lucas Caldwell MRN: [id number] Date of Birth: 24-Aug-1933

## 2020-07-14 ENCOUNTER — Other Ambulatory Visit: Payer: Self-pay | Admitting: Family Medicine

## 2020-07-14 DIAGNOSIS — E1142 Type 2 diabetes mellitus with diabetic polyneuropathy: Secondary | ICD-10-CM

## 2020-07-14 NOTE — Telephone Encounter (Signed)
Requested medication (s) are due for refill today: yes  Requested medication (s) are on the active medication list: yes  Last refill:    Future visit scheduled: no  Notes to clinic: not delegated    Requested Prescriptions  Pending Prescriptions Disp Refills   traMADol (ULTRAM) 50 MG tablet [Pharmacy Med Name: tramadol 50 mg tablet] 30 tablet 2    Sig: TAKE 1 OR 2 TABLETS BY MOUTH AT BEDTIME AS NEEDED      Not Delegated - Analgesics:  Opioid Agonists Failed - 07/14/2020 10:21 AM      Failed - This refill cannot be delegated      Failed - Urine Drug Screen completed in last 360 days      Passed - Valid encounter within last 6 months    Recent Outpatient Visits           1 month ago Controlled type 2 diabetes mellitus with diabetic nephropathy, with long-term current use of insulin ([place])   [facility] [name], DO   1 month ago Balance disorder   [place], DO   3 months ago Balance disorder   [place], DO   5 months ago Diabetic polyneuropathy associated with type 2 diabetes mellitus [facility])   [place], DO   5 months ago Constipation, unspecified constipation type   [place], DO       Future Appointments             In 2 months End, [name], MD [facility] [place]

## 2020-07-17 ENCOUNTER — Other Ambulatory Visit: Payer: Self-pay

## 2020-07-17 ENCOUNTER — Encounter: Payer: Self-pay | Admitting: Primary Care

## 2020-07-17 ENCOUNTER — Ambulatory Visit (INDEPENDENT_AMBULATORY_CARE_PROVIDER_SITE_OTHER): Payer: Medicare HMO | Admitting: Primary Care

## 2020-07-17 DIAGNOSIS — G4733 Obstructive sleep apnea (adult) (pediatric): Secondary | ICD-10-CM

## 2020-07-17 NOTE — Patient Instructions (Signed)
CPAP titration study scheduled for May 5th 9:45 pm

## 2020-07-17 NOTE — Progress Notes (Signed)
Virtual Visit via Telephone Note  I connected with Lucas Caldwell on 07/17/20 at  9:30 AM EDT by telephone and verified that I am speaking with the correct person using two identifiers.  Location: Patient: Home  Provider: Office   I discussed the limitations, risks, security and privacy concerns of performing an evaluation and management service by telephone and the availability of in person appointments. I also discussed with the patient that there may be a patient responsible charge related to this service. The patient expressed understanding and agreed to proceed.   History of Present Illness: 87 year old male, former smoker quit 1975 (35-pack-year history).  Past medical history significant for chronic respiratory failure with hypoxia, excessive daytime sleepiness.  Patient of Dr. Mortimer Fries, last seen on 07/08/19.   07/12/2020 Patient was recently diagnosed with obstructive sleep apnea. PSG sleep study completed on 04/19/2020 which showed moderate obstructive sleep apnea, total apnea hypopnea index (AHI) was 15.3/hr. REM related AHI of 47/hr. He was ordered for CPAP titration study in [place] which has not yet been completed. This has been scheduled May 5th. He does not drink alcohol. He does not take sleep medication. No acute complaints. We will follow-up after CPAP titration study.    Observations/Objective:  - Wife on phone call   Assessment and Plan:  Moderate OSA - PSG 04/19/2020 which showed moderate obstructive sleep apnea, total apnea hypopnea index (AHI) was 15.3/hr. REM related AHI of 47/hr - He was ordered for CPAP titration study in [place] which has not yet been completed. This has been scheduled May 5th. - We will follow-up after CPAP titration study/ at that time will place order for CPAP machine and supplies  - Advised not to drive if experiencing excessive daytime fatigue/somolence   Follow Up Instructions:  3 months with Dr. Mortimer Fries    I discussed the assessment and  treatment plan with the patient. The patient was provided an opportunity to ask questions and all were answered. The patient agreed with the plan and demonstrated an understanding of the instructions.   The patient was advised to call back or seek an in-person evaluation if the symptoms worsen or if the condition fails to improve as anticipated.  I provided 10 minutes of non-face-to-face time during this encounter.   Martyn Ehrich, NP \

## 2020-07-19 ENCOUNTER — Other Ambulatory Visit: Payer: Self-pay

## 2020-07-19 ENCOUNTER — Ambulatory Visit: Payer: Medicare HMO | Attending: Family Medicine

## 2020-07-19 DIAGNOSIS — M6281 Muscle weakness (generalized): Secondary | ICD-10-CM | POA: Diagnosis not present

## 2020-07-19 DIAGNOSIS — G4733 Obstructive sleep apnea (adult) (pediatric): Secondary | ICD-10-CM | POA: Insufficient documentation

## 2020-07-19 DIAGNOSIS — R2681 Unsteadiness on feet: Secondary | ICD-10-CM

## 2020-07-19 DIAGNOSIS — R2689 Other abnormalities of gait and mobility: Secondary | ICD-10-CM | POA: Diagnosis not present

## 2020-07-19 NOTE — Therapy (Signed)
[place] MAIN [facility] SERVICES [address] [place], [place], [address] Phone: [phone]   Fax:  [phone]  Physical Therapy Treatment  Patient Details  Name: Lucas Caldwell MRN: [id number] Date of Birth: 01/30/1934 Referring Provider (PT): [name], DO   Encounter Date: 07/19/2020   PT End of Session - 07/19/20 1342    Visit Number 9    Number of Visits 25    Date for PT Re-Evaluation 08/29/20    Authorization Time Period eval performed on 06/06/2020    PT Start Time 1345    PT Stop Time 1429    PT Time Calculation (min) 44 min    Equipment Utilized During Treatment Gait belt    Activity Tolerance Patient tolerated treatment well    Behavior During Therapy [facility] for tasks assessed/performed           Past Medical History:  Diagnosis Date  . Arthritis    "probably in his legs" (08/15/2016)  . Blind right eye   . BPH (benign prostatic hyperplasia)   . Breast asymmetry    Left breast is larger, present for several years.  [name] CAD (coronary artery disease)    a. Cath in the late 90's - reportedly ok;  b. 2014 s/p stenting x 2 @ [facility]; c 08/19/16 Cath/PCI with DES -> RCA, plan to treat LM 70% medically. Seen by surgery and felt to be too high risk for CABG; d. 08/2018 Cath: LM 70 (iFR 0.86), LAD 20ost, 40p, 50/61m, D1 70, LCX patent stent, OM1 30, RCA patent stent, 34m ISR, 40d, RPDA 30. PCWP 8. CO/CI 4.0/2.1; e. 03/2019 PCI to LAD (2.5x15 Resolute Onyx DES).  . Chronic combined systolic (congestive) and diastolic (congestive) heart failure ([place])    a. Previously reduced EF-->50% by echo in 2012;  b. 06/2015 Echo: EF 50-55%  c. 07/2016 Echo: EF 45-50%; d. 12/2017 Echo: EF 55%, Gr1 DD, mild MR; e. 08/2018 Echo: EF 35-40%, mildly dil PA; f. 04/2019 Echo: EF 30-35%, glob HK, nl RV fxn; g. s/p Cardiomems.  . Chronic kidney disease (CKD), stage IV (severe) ([place])   . CML (chronic myelocytic leukemia) ([place])   . GERD (gastroesophageal reflux  disease)   . GIB (gastrointestinal bleeding)    a. 12/2017 3 unit PRBC GIB in setting of coumadin-->Endo/colon multiple duodenal ulcers and a single bleeding ulcer in the proximal ascending colon status post hemostatic clipping x2.  . Gout   . Hyperlipemia   . Hypertension   . Hypothyroidism   . Iron deficiency anemia   . Ischemic cardiomyopathy    a. Previously reduced EF-->50% by echo in 2012;  b. 06/2015 Echo: EF 50-55%, Gr2 DD, mild MR, mildly dil LA, Ao sclerosis, mild TR;  c. 07/2016 Echo: EF 45-50%; d. 12/2017 Echo: EF 55%, Gr1 DD, mild MR; e. 08/2018 Echo: EF 35-40%; f. 04/2019 Echo: EF 30-35%.  . Migraine    "in the 1960s" (08/15/2016)  . Obstructive sleep apnea   . PAF (paroxysmal atrial fibrillation) (HCC)    a. ? Dx 2014-->s/p DCCV;  b. CHA2DS2VASc = 6--> Coumadin.  . Prostate cancer ([name])   . RBBB   . Type II diabetes mellitus ([place])     Past Surgical History:  Procedure Laterality Date  . CATARACT EXTRACTION W/ INTRAOCULAR LENS  IMPLANT, BILATERAL Bilateral   . COLONOSCOPY N/A 01/13/2018   Procedure: COLONOSCOPY;  Surgeon: Lin Landsman, MD;  Location: [facility] ENDOSCOPY;  Service: Gastroenterology;  Laterality: N/A;  .  COLONOSCOPY WITH PROPOFOL N/A 01/01/2018   Procedure: COLONOSCOPY WITH PROPOFOL;  Surgeon: Lin Landsman, MD;  Location: [facility] ENDOSCOPY;  Service: Gastroenterology;  Laterality: N/A;  . CORONARY ANGIOPLASTY WITH STENT PLACEMENT  09/2012   2 stents  . CORONARY STENT INTERVENTION N/A 08/19/2016   Procedure: Coronary Stent Intervention;  Surgeon: [name], MD;  Location: [place] CV LAB;  Service: Cardiovascular;  Laterality: N/A;  . CORONARY STENT INTERVENTION N/A 04/13/2019   Procedure: CORONARY STENT INTERVENTION;  Surgeon: [name], MD;  Location: [place] CV LAB;  Service: Cardiovascular;  Laterality: N/A;  . ESOPHAGEAL MANOMETRY N/A 12/16/2017   Procedure: ESOPHAGEAL MANOMETRY (EM);  Surgeon: Lin Landsman, MD;  Location:  [facility] ENDOSCOPY;  Service: Gastroenterology;  Laterality: N/A;  . ESOPHAGOGASTRODUODENOSCOPY N/A 12/20/2016   Procedure: ESOPHAGOGASTRODUODENOSCOPY (EGD);  Surgeon: Lin Landsman, MD;  Location: [facility] ENDOSCOPY;  Service: Gastroenterology;  Laterality: N/A;  . ESOPHAGOGASTRODUODENOSCOPY N/A 01/13/2018   Procedure: ESOPHAGOGASTRODUODENOSCOPY (EGD);  Surgeon: Lin Landsman, MD;  Location: [facility] ENDOSCOPY;  Service: Gastroenterology;  Laterality: N/A;  . ESOPHAGOGASTRODUODENOSCOPY (EGD) WITH PROPOFOL N/A 11/03/2017   Procedure: ESOPHAGOGASTRODUODENOSCOPY (EGD) WITH PROPOFOL;  Surgeon: Lin Landsman, MD;  Location: [facility] ENDOSCOPY;  Service: Gastroenterology;  Laterality: N/A;  . EYE SURGERY    . HERNIA REPAIR     "navel"  . LEFT HEART CATH AND CORONARY ANGIOGRAPHY N/A 08/14/2016   Procedure: Left Heart Cath and Coronary Angiography;  Surgeon: [name], MD;  Location: [place] CV LAB;  Service: Cardiovascular;  Laterality: N/A;  . PRESSURE SENSOR/CARDIOMEMS N/A 09/23/2019   Procedure: PRESSURE SENSOR/CARDIOMEMS;  Surgeon: Jolaine Artist, MD;  Location: [place] CV LAB;  Service: Cardiovascular;  Laterality: N/A;  . RIGHT HEART CATH N/A 09/23/2019   Procedure: RIGHT HEART CATH;  Surgeon: Jolaine Artist, MD;  Location: [place] CV LAB;  Service: Cardiovascular;  Laterality: N/A;  . RIGHT/LEFT HEART CATH AND CORONARY ANGIOGRAPHY N/A 09/01/2018   Procedure: RIGHT/LEFT HEART CATH AND CORONARY ANGIOGRAPHY;  Surgeon: [name], MD;  Location: [place] CV LAB;  Service: Cardiovascular;  Laterality: N/A;  . RIGHT/LEFT HEART CATH AND CORONARY ANGIOGRAPHY N/A 04/13/2019   Procedure: RIGHT/LEFT HEART CATH AND CORONARY ANGIOGRAPHY;  Surgeon: [name], MD;  Location: [place] CV LAB;  Service: Cardiovascular;  Laterality: N/A;  . TOE AMPUTATION Right    "big toe"    There were no vitals filed for this visit.   Subjective Assessment - 07/19/20 1416     Subjective Patient went up to [place] to see his sick sister. No falls or LOB since last session    Patient is accompained by: Family member    Pertinent History Pt is an 87 y/o male presenting to physical therapy today with spouse with reports of difficulty with balance, dizziness, and walking. Pt states he started feeling "swimmy headed" approximately one year ago and that it is affecting his balance with walking and has been progressively worsening. Pt reports he feels he is moving when he's still. He denies spinning sensation. He reports he sometimes has headache but says "they are not that bad." Pt reports feeling SOB with walking and household tasks (his doctor is aware). Pt denies unintentional weight gain or loss. Pt reports 1 fall the last 6 months. Per spouse pt tripped at time of fall and do not provide further details. Pt with extensive PMH. 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    Limitations House hold activities;Lifting;Standing;Walking;Writing   Pt reports when he tries to write his hand shakes a lot   How long can you sit comfortably? not limited    How long can you stand comfortably? 5 minutes    How long can you walk comfortably? 10 minutes    Diagnostic tests DG Chest 2 View  from 07/06/2019 per  chart: "  IMPRESSION:  No acute abnormalities."    Patient Stated Goals Feel more steady with walking    Currently in Pain? No/denies              Patient went up to [place] to see his sick sister. No falls or LOB since last session    TREATMENT   NEURO RE-ED: CGA-min assist for all of the following   Standing with CGA next to support surface:   Airex pad: grab ball and throw at target without UE support x15 balls Airex pad: one foot on green dynadisc one foot on airex pad, hold position for 30 seconds, switch legs, 2x each LE; airex pad 6" step toe taps with SUE support 10x each LE Step over orange hurdle and back 10x each LE; BUE Support cues for body mechanics and alignment  bosu ball: round side up: forward modified lunges 12x each LE with intermittent LOB bosu ball: round side up: lateral modified lunges 12x each LE, intermittent LOB.   Half foam roller: df/pf 15s BUE support   Lateral step with basketball chest press each step 4x length of // bars    Three cone taps 3x12 each LE; SUE support, pt required up to min assist d/t LOB to regain balance   Hedgehog cone taps with UUE support to one finger support on bar with forward stepping and cross-stepping x multiple reps each LE; frequent VC cuing provided     Education provided throughout in the form of demo, VC/TC to facilitate movement at target joints and correct muscle activation with exercises. Pt demonstrates good carryover within session with cues.   TherEx: Lateral step with basketball chest press each step 4x length of // bars  High knee forward/backwards walking/marching 6x length of // bars  Heel raises 10x with BUE support    STS 10x with basketball with cues for sequencing and body mechanics for improved control and safety awarness  Seated LAQ 10x each LE 3 second holds     Assessment:  Patient is highly motivated throughout physical therapy session. He ha decreased episodes of posterior LOB this  session. His ankle righting reactions are improving however as he fatigues he does have decreased ability to retain COM. The pt will benefit from further skilled PT to improve BLE strength and balance to decrease fall risk.                       PT Education - 07/19/20 1342    Education Details exercise technique, body mechanics    Person(s) Educated Patient    Methods Explanation;Demonstration;Tactile cues;Verbal cues  Comprehension Verbalized understanding;Returned demonstration;Verbal cues required;Tactile cues required            PT Short Term Goals - 06/07/20 0811      PT SHORT TERM GOAL #1   Title Patient will be independent in home exercise program to improve strength/mobility for better functional independence with ADLs.    Baseline 06/06/2020: HEP to be initiated next visit    Time 6    Period Weeks    Status New    Target Date 07/18/20             PT Long Term Goals - 06/13/20 1713      PT LONG TERM GOAL #1   Title Patient will increase FOTO score to equal to or greater than 54 to demonstrate statistically significant improvement in mobility and quality of life.    Baseline 06/06/2020: 46 (risk-adjusted 45)    Time 12    Period Weeks    Status New    Target Date 08/29/20      PT LONG TERM GOAL #2   Title Patient will increase 10 meter walk test to >1.47m/s as to improve gait speed for better community ambulation and to reduce fall risk.    Baseline 06/06/2020: 0.59 m/s with SPC    Time 12    Period Weeks    Status New    Target Date 08/29/20      PT LONG TERM GOAL #3   Title Patient will increase Berg Balance score by > 6 points to demonstrate decreased fall risk during functional activities.    Baseline 06/06/2020: 40/56    Time 12    Period Weeks    Status New    Target Date 08/29/20      PT LONG TERM GOAL #4   Title Patient (> 28 years old) will complete five times sit to stand test in < 15 seconds indicating an increased LE strength  and improved balance.    Baseline 06/06/2020: 18 sec performed with BUE assist off chair    Time 12    Period Weeks    Status New    Target Date 08/29/20      PT LONG TERM GOAL #5   Title Patient will reduce dizziness handicap inventory score by at least 18 points, for less dizziness with ADLs and increased safety with home and work tasks.    Baseline 06/06/2020: 48% (moderate handicap)    Time 12    Period Weeks    Status New    Target Date 08/29/20      Additional Long Term Goals   Additional Long Term Goals Yes      PT LONG TERM GOAL #6   Title Patient will increase Functional Gait Assessment score to >20/30 as to reduce fall risk and improve dynamic gait safety with community ambulation.    Baseline 06/13/2020: 15/30    Time 11    Period Weeks    Status New    Target Date 08/29/20                 Plan - 07/19/20 1443    Clinical Impression Statement Patient is highly motivated throughout physical therapy session. He ha decreased episodes of posterior LOB this session. His ankle righting reactions are improving however as he fatigues he does have decreased ability to retain COM. The pt will benefit from further skilled PT to improve BLE strength and balance to decrease fall risk.    Personal Factors and Comorbidities  Age;Comorbidity 3+;Fitness;Comorbidity 1;Comorbidity 2;Past/Current Experience;Time since onset of injury/illness/exacerbation    Comorbidities Pertinent PMH/comorbidities per chart: Exertional dyspnea, centrilobular emphysema, angina, CAD, acute on chronic HFrEF, controlled DMII with diabetic nephropathy, diabetic neuropathy, gout, CKD, essential HTN, paroxysmal atrial fibrillation, chronic anticoagulation, CKD, fatigue, iron deficiency anemia, pain of LE, OA of B knees, hx of partial ray amputation of R great toe, s/p cataract extraction status of eye, enthesopathy of ankle and tarsus, GERD, hyperlipidemia, hypothyroidism, nuclear sclerosis of L eye, psedophakia  of R eye, ischemic cardiomyopathy, esophageal dysphagia, chronic heart failure with preserved ejection fraction, PSVT, coronary stent patent, bifascicular block, trifascicular block, wound infection, urge incontinence of urine.    Examination-Activity Limitations Bathing;Bed Mobility;Bend;Caring for Others;Carry;Continence;Dressing;Hygiene/Grooming;Stairs;Squat;Reach Overhead;Locomotion Level;Lift;Stand;Toileting;Transfers    Examination-Participation Restrictions Medication Management;Cleaning;Meal Prep;Community Activity;Driving;Laundry;Shop;Yard Work;Volunteer    Stability/Clinical Decision Making Evolving/Moderate complexity    Rehab Potential Good    PT Frequency 2x / week    PT Duration 12 weeks    PT Treatment/Interventions ADLs/Self Care Home Management;Biofeedback;Canalith Repostioning;Cryotherapy;Electrical Stimulation;Moist Heat;Traction;Ultrasound;DME Instruction;Gait training;Stair training;Functional mobility training;Therapeutic activities;Therapeutic exercise;Balance training;Neuromuscular re-education;Patient/family education;Orthotic Fit/Training;Manual techniques;Wheelchair mobility training;Passive range of motion;Dry needling;Energy conservation;Taping;Vestibular;Joint Manipulations;Spinal Manipulations;Aquatic Therapy    PT Next Visit Plan initiate balance exercises, TUG, FGA, progress LE strengthening, foot clearance exercises, VORx1, hip abudction/extension, nustep, dynamic balance exercises    Consulted and Agree with Plan of Care Patient;Family member/caregiver    Family Member Consulted spouse           Patient will benefit from skilled therapeutic intervention in order to improve the following deficits and impairments:  Abnormal gait,Dizziness,Increased fascial restricitons,Impaired sensation,Improper body mechanics,Pain,Cardiopulmonary status limiting activity,Decreased coordination,Decreased mobility,Postural dysfunction,Decreased activity tolerance,Decreased  endurance,Decreased range of motion,Hypomobility,Decreased strength,Decreased balance,Difficulty walking,Impaired flexibility  Visit Diagnosis: Unsteadiness on feet  Other abnormalities of gait and mobility  Muscle weakness (generalized)     Problem List Patient Active Problem List   Diagnosis Date Noted  . Obstructive sleep apnea 06/29/2020  . Urge incontinence of urine 12/22/2019  . Trifascicular block 10/20/2019  . Wound infection 10/08/2019  . Bifascicular block 05/13/2019  . Coronary stent patent 04/13/2019  . Chronic HFrEF (heart failure with reduced ejection fraction) ([place]) 03/19/2019  . PSVT (paroxysmal supraventricular tachycardia) ([place]) 03/19/2019  . 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 ([place]) 08/24/2018  . CKD (chronic kidney disease), stage IV ([place]) 06/04/2018  . Osteoarthritis of knees, bilateral 06/02/2018  . Hyperlipidemia associated with type 2 diabetes mellitus ([place]) 05/06/2018  . Pain of lower extremity 03/06/2018  . Duodenitis 01/30/2018  . Chronic heart failure with preserved ejection fraction (HFpEF) ([place]) 01/23/2018  . Hematochezia   . Melena   . Rectal bleeding 01/11/2018  . AVM (arteriovenous malformation) of colon without hemorrhage   . Schatzki's ring   . Esophageal dysphagia   . Diabetic retinopathy ([place]) 07/08/2017  . Ischemic cardiomyopathy 06/26/2017  . Cardiomyopathy ([place]) 06/26/2017  . Centrilobular emphysema ([place]) 06/13/2017  . Fatigue 12/18/2016  . Stable angina ([place]) 09/20/2016  . Stage 3a chronic kidney disease ([place]) 09/20/2016  . Paroxysmal atrial fibrillation ([place]) 08/28/2016  . Chronic anticoagulation 08/28/2016  . Essential hypertension 08/20/2016  . 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 heart with stable angina pectoris ([place]) 08/19/2016  . Accelerating angina ([place]) 08/15/2016  . Exertional dyspnea 08/12/2016  . 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[place]) 12/29/2015  . Gynecomastia, male 03/31/2014  . Hypertrophy of  breast 03/31/2014  . Nuclear sclerosis of left eye 10/22/2013  . Enthesopathy of ankle and tarsus 04/10/2013  . Controlled type  2 diabetes mellitus with diabetic nephropathy ([place]) 04/09/2013  . Diabetic neuropathy associated with type 2 diabetes mellitus ([place]) 04/09/2013  . Cataract of left eye 11/25/2011  . Corneal opacity 09/30/2011  . Pseudophakia of right eye 09/30/2011  . GERD (gastroesophageal reflux disease) 09/25/2011  . Gout 09/25/2011  . Hyperlipidemia LDL goal <70 09/25/2011  . Status post cataract extraction 04/11/2011  . Cataract extraction status of eye 04/11/2011  . Iron deficiency anemia 04/01/2011  . Benign localized hyperplasia of prostate without urinary obstruction and other lower urinary tract symptoms (LUTS) 12/10/2010  . Enlarged prostate without lower urinary tract symptoms (luts) 12/10/2010  . Colon cancer screening 10/11/2010  . Hypothyroidism 10/11/2010  . Hyperlipidemia 09/20/2003  . Essential and other specified forms of tremor 01/05/2001  . Essential tremor 01/05/2001   Janna Arch, PT, DPT   07/19/2020, 2:45 PM  [place] MAIN [facility] SERVICES [address] [place], [place], [address] Phone: [phone]   Fax:  [phone]  Name: Lucas Caldwell MRN: [id number] Date of Birth: 1933-09-14

## 2020-07-20 ENCOUNTER — Telehealth: Payer: Self-pay

## 2020-07-20 ENCOUNTER — Ambulatory Visit: Payer: Medicare HMO | Attending: Pulmonary Disease

## 2020-07-20 DIAGNOSIS — G4733 Obstructive sleep apnea (adult) (pediatric): Secondary | ICD-10-CM | POA: Diagnosis not present

## 2020-07-20 NOTE — Telephone Encounter (Signed)
Called and spoke with the patient to let him know that the medications have been dropped off here at the office and they are available for him to pick up.    Patient verbalized understanding and said that his wife would be by to collect them possibly today.

## 2020-07-21 ENCOUNTER — Ambulatory Visit: Payer: Medicare HMO

## 2020-07-21 ENCOUNTER — Other Ambulatory Visit: Payer: Self-pay

## 2020-07-21 DIAGNOSIS — R2689 Other abnormalities of gait and mobility: Secondary | ICD-10-CM | POA: Diagnosis not present

## 2020-07-21 DIAGNOSIS — R2681 Unsteadiness on feet: Secondary | ICD-10-CM | POA: Diagnosis not present

## 2020-07-21 DIAGNOSIS — M6281 Muscle weakness (generalized): Secondary | ICD-10-CM | POA: Diagnosis not present

## 2020-07-21 DIAGNOSIS — G4733 Obstructive sleep apnea (adult) (pediatric): Secondary | ICD-10-CM | POA: Diagnosis not present

## 2020-07-26 ENCOUNTER — Other Ambulatory Visit: Payer: Self-pay | Admitting: Family Medicine

## 2020-07-26 ENCOUNTER — Other Ambulatory Visit: Payer: Self-pay

## 2020-07-26 ENCOUNTER — Ambulatory Visit: Payer: Medicare HMO

## 2020-07-26 ENCOUNTER — Telehealth: Payer: Self-pay | Admitting: Internal Medicine

## 2020-07-26 ENCOUNTER — Other Ambulatory Visit: Payer: Self-pay | Admitting: Internal Medicine

## 2020-07-26 DIAGNOSIS — M6281 Muscle weakness (generalized): Secondary | ICD-10-CM

## 2020-07-26 DIAGNOSIS — R2681 Unsteadiness on feet: Secondary | ICD-10-CM | POA: Diagnosis not present

## 2020-07-26 DIAGNOSIS — R2689 Other abnormalities of gait and mobility: Secondary | ICD-10-CM | POA: Diagnosis not present

## 2020-07-26 DIAGNOSIS — G4733 Obstructive sleep apnea (adult) (pediatric): Secondary | ICD-10-CM | POA: Diagnosis not present

## 2020-07-26 DIAGNOSIS — E1142 Type 2 diabetes mellitus with diabetic polyneuropathy: Secondary | ICD-10-CM

## 2020-07-26 NOTE — Telephone Encounter (Signed)
Patient calling the office for samples of medication:   1.  What medication and dosage are you requesting samples for? Jardiance 10 mg po q d  2.  Are you currently out of this medication? No 1 week left pending assistance and cannot afford   Patient will check with front desk after rehab appt today to see if ready .

## 2020-07-26 NOTE — Telephone Encounter (Signed)
Pt came to office today requesting samples of Jardiance 10 mg---see note below. Samples provided.

## 2020-07-26 NOTE — Therapy (Signed)
[place] MAIN [facility] SERVICES [address] [place], [place], [address] Phone: [phone]   Fax:  [phone]  Physical Therapy Treatment/Physical Therapy Progress Note   Dates of reporting period  06/06/2020   to   07/26/2020   Patient Details  Name: Lucas Caldwell MRN: [id number] Date of Birth: 17-Sep-1933 Referring Provider (PT): [name], DO   Encounter Date: 07/26/2020   PT End of Session - 07/26/20 1549    Visit Number 10    Number of Visits 25    Date for PT Re-Evaluation 08/29/20    Authorization Time Period eval performed on 06/06/2020    PT Start Time 1431    PT Stop Time 1515    PT Time Calculation (min) 44 min    Equipment Utilized During Treatment Gait belt    Activity Tolerance Patient tolerated treatment well    Behavior During Therapy [facility] for tasks assessed/performed           Past Medical History:  Diagnosis Date  . Arthritis    "probably in his legs" (08/15/2016)  . Blind right eye   . BPH (benign prostatic hyperplasia)   . Breast asymmetry    Left breast is larger, present for several years.  [name] CAD (coronary artery disease)    a. Cath in the late 90's - reportedly ok;  b. 2014 s/p stenting x 2 @ [facility]; c 08/19/16 Cath/PCI with DES -> RCA, plan to treat LM 70% medically. Seen by surgery and felt to be too high risk for CABG; d. 08/2018 Cath: LM 70 (iFR 0.86), LAD 20ost, 40p, 50/58m, D1 70, LCX patent stent, OM1 30, RCA patent stent, 58m ISR, 40d, RPDA 30. PCWP 8. CO/CI 4.0/2.1; e. 03/2019 PCI to LAD (2.5x15 Resolute Onyx DES).  . Chronic combined systolic (congestive) and diastolic (congestive) heart failure ([place])    a. Previously reduced EF-->50% by echo in 2012;  b. 06/2015 Echo: EF 50-55%  c. 07/2016 Echo: EF 45-50%; d. 12/2017 Echo: EF 55%, Gr1 DD, mild MR; e. 08/2018 Echo: EF 35-40%, mildly dil PA; f. 04/2019 Echo: EF 30-35%, glob HK, nl RV fxn; g. s/p Cardiomems.  . Chronic kidney disease (CKD), stage IV  (severe) ([place])   . CML (chronic myelocytic leukemia) ([place])   . GERD (gastroesophageal reflux disease)   . GIB (gastrointestinal bleeding)    a. 12/2017 3 unit PRBC GIB in setting of coumadin-->Endo/colon multiple duodenal ulcers and a single bleeding ulcer in the proximal ascending colon status post hemostatic clipping x2.  . Gout   . Hyperlipemia   . Hypertension   . Hypothyroidism   . Iron deficiency anemia   . Ischemic cardiomyopathy    a. Previously reduced EF-->50% by echo in 2012;  b. 06/2015 Echo: EF 50-55%, Gr2 DD, mild MR, mildly dil LA, Ao sclerosis, mild TR;  c. 07/2016 Echo: EF 45-50%; d. 12/2017 Echo: EF 55%, Gr1 DD, mild MR; e. 08/2018 Echo: EF 35-40%; f. 04/2019 Echo: EF 30-35%.  . Migraine    "in the 1960s" (08/15/2016)  . Obstructive sleep apnea   . PAF (paroxysmal atrial fibrillation) (H[facility])    a. ? Dx 2014-->s/p DCCV;  b. CHA2DS2VASc = 6--> Coumadin.  . Prostate cancer ([place])   . RBBB   . Type II diabetes mellitus ([place])     Past Surgical History:  Procedure Laterality Date  . CATARACT EXTRACTION W/ INTRAOCULAR LENS  IMPLANT, BILATERAL Bilateral   . COLONOSCOPY N/A 01/13/2018   Procedure:  COLONOSCOPY;  Surgeon: Lin Landsman, MD;  Location: [facility] ENDOSCOPY;  Service: Gastroenterology;  Laterality: N/A;  . COLONOSCOPY WITH PROPOFOL N/A 01/01/2018   Procedure: COLONOSCOPY WITH PROPOFOL;  Surgeon: Lin Landsman, MD;  Location: [facility] ENDOSCOPY;  Service: Gastroenterology;  Laterality: N/A;  . CORONARY ANGIOPLASTY WITH STENT PLACEMENT  09/2012   2 stents  . CORONARY STENT INTERVENTION N/A 08/19/2016   Procedure: Coronary Stent Intervention;  Surgeon: [name], MD;  Location: [place] CV LAB;  Service: Cardiovascular;  Laterality: N/A;  . CORONARY STENT INTERVENTION N/A 04/13/2019   Procedure: CORONARY STENT INTERVENTION;  Surgeon: [name], MD;  Location: [place] CV LAB;  Service: Cardiovascular;  Laterality: N/A;  . ESOPHAGEAL MANOMETRY N/A  12/16/2017   Procedure: ESOPHAGEAL MANOMETRY (EM);  Surgeon: Lin Landsman, MD;  Location: [facility] ENDOSCOPY;  Service: Gastroenterology;  Laterality: N/A;  . ESOPHAGOGASTRODUODENOSCOPY N/A 12/20/2016   Procedure: ESOPHAGOGASTRODUODENOSCOPY (EGD);  Surgeon: Lin Landsman, MD;  Location: [facility] ENDOSCOPY;  Service: Gastroenterology;  Laterality: N/A;  . ESOPHAGOGASTRODUODENOSCOPY N/A 01/13/2018   Procedure: ESOPHAGOGASTRODUODENOSCOPY (EGD);  Surgeon: Lin Landsman, MD;  Location: [facility] ENDOSCOPY;  Service: Gastroenterology;  Laterality: N/A;  . ESOPHAGOGASTRODUODENOSCOPY (EGD) WITH PROPOFOL N/A 11/03/2017   Procedure: ESOPHAGOGASTRODUODENOSCOPY (EGD) WITH PROPOFOL;  Surgeon: Lin Landsman, MD;  Location: [facility] ENDOSCOPY;  Service: Gastroenterology;  Laterality: N/A;  . EYE SURGERY    . HERNIA REPAIR     "nav[place]"  . LEFT HEART CATH AND CORONARY ANGIOGRAPHY N/A 08/14/2016   Procedure: Left Heart Cath and Coronary Angiography;  Surgeon: [name], MD;  Location: [place] CV LAB;  Service: Cardiovascular;  Laterality: N/A;  . PRESSURE SENSOR/CARDIOMEMS N/A 09/23/2019   Procedure: PRESSURE SENSOR/CARDIOMEMS;  Surgeon: Jolaine Artist, MD;  Location: [place] CV LAB;  Service: Cardiovascular;  Laterality: N/A;  . RIGHT HEART CATH N/A 09/23/2019   Procedure: RIGHT HEART CATH;  Surgeon: Jolaine Artist, MD;  Location: [place] CV LAB;  Service: Cardiovascular;  Laterality: N/A;  . RIGHT/LEFT HEART CATH AND CORONARY ANGIOGRAPHY N/A 09/01/2018   Procedure: RIGHT/LEFT HEART CATH AND CORONARY ANGIOGRAPHY;  Surgeon: [name], MD;  Location: [place] CV LAB;  Service: Cardiovascular;  Laterality: N/A;  . RIGHT/LEFT HEART CATH AND CORONARY ANGIOGRAPHY N/A 04/13/2019   Procedure: RIGHT/LEFT HEART CATH AND CORONARY ANGIOGRAPHY;  Surgeon: [name], MD;  Location: [place] CV LAB;  Service: Cardiovascular;  Laterality: N/A;  . TOE AMPUTATION Right    "big  toe"    There were no vitals filed for this visit.   Subjective Assessment - 07/26/20 1547    Subjective No falls or LOB since last session. Pt does report he feels his balance is off today. He says he may have to go see his sister as her health is declining.    Patient is accompained by: Family member    Pertinent History Pt is an 87 y/o male presenting to physical therapy today with spouse with reports of difficulty with balance, dizziness, and walking. Pt states he started feeling "swimmy headed" approximately one year ago and that it is affecting his balance with walking and has been progressively worsening. Pt reports he feels he is moving when he's still. He denies spinning sensation. He reports he sometimes has headache but says "they are not that bad." Pt reports feeling SOB with walking and household tasks (his doctor is aware). Pt denies unintentional weight gain or loss. Pt reports 1 fall the last 6 months. Per spouse pt tripped at  time of fall and do not provide further details. Pt with extensive PMH. 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    Limitations House hold activities;Lifting;Standing;Walking;Writing   Pt reports when he tries to write his hand shakes a lot   How long can you sit comfortably? not  limited    How long can you stand comfortably? 5 minutes    How long can you walk comfortably? 10 minutes    Diagnostic tests DG Chest 2 View  from 07/06/2019 per chart: "  IMPRESSION:  No acute abnormalities."    Patient Stated Goals Feel more steady with walking    Currently in Pain? No/denies              [facility] PT Assessment - 07/26/20 0001      Berg Balance Test   Sit to Stand Able to stand without using hands and stabilize independently    Standing Unsupported Able to stand safely 2 minutes    Sitting with Back Unsupported but Feet Supported on Floor or Stool Able to sit safely and securely 2 minutes    Stand to Sit Sits safely with minimal use of hands    Transfers Able to transfer safely, minor use of hands    Standing Unsupported with Eyes Closed Able to stand 10 seconds safely    Standing Unsupported with Feet Together Able to place feet together independently and stand for 1 minute with supervision    From Standing, Reach Forward with Outstretched Arm Can reach forward >12 cm safely (5")    From Standing Position, Pick up Object from Floor Able to pick up shoe, needs supervision    From Standing Position, Turn to Look Behind Over each Shoulder Looks behind from both sides and weight shifts well    Turn 360 Degrees Able to turn 360 degrees safely but slowly    Standing Unsupported, Alternately Place Feet on Step/Stool Able to stand independently and complete 8 steps >20 seconds    Standing Unsupported, One Foot in Front Able to take small step independently and hold 30 seconds    Standing on One Leg Able to lift leg independently and hold equal to or more than 3 seconds    Total Score 46           TREATMENT - Reassessment of goals   Therex:   5xSTS: 12 sec  FOTO: 73%    Neuro Re-Education:   DHI: 36  10MWT: 0.83. m/s  Berg: 46/56   Education provided with VC/TC and demonstration for movement at target joints and to facilitate correct muscle activation with  all testing/exercise performed this session. Pt with good carryover within session. Pt and his spouse also educated on performance on tests, progress, areas to be improved and POC. They verbalized understanding.    Assessment: Pt making gains toward all therapy goals tested today. Pt shows improvement on 5xSTS (12 sec), FOTO score (73%), DHI score (36), 10MWT (0.83 m/s)  and Berg [phone]). These findings indicate pt has improved his LE strength/power, gait speed/ability, balance and perceived handicap due to dizziness as well as overall functional mobility. While pt shows improved scores, his Berg, 10MWT, and DHI scores still indicate impaired balance, gait speed, and some perception of handicap due to dizziness. FGA to be tested next session. Patient's condition has the potential to improve in response to therapy. Maximum improvement is yet to be obtained. The anticipated improvement is attainable and reasonable in a generally predictable time.  The pt will benefit from further skilled PT to improve the aforementioned impairments in order to reduce fall risk and increase QOL.     PT Education - 07/26/20 1548    Education Details Pt educated on reassessment findings, POC, indications for therapy    Person(s) Educated Patient    Methods Explanation    Comprehension Verbalized understanding;Returned demonstration            PT Short Term Goals - 06/07/20 0811      PT SHORT TERM GOAL #1   Title Patient will be independent in home exercise program to improve strength/mobility for better functional independence with ADLs.    Baseline 06/06/2020: HEP to be initiated next visit    Time 6    Period Weeks    Status New    Target Date 07/18/20             PT Long Term Goals - 07/26/20 1435      PT LONG TERM GOAL #1   Title Patient will increase FOTO score to equal to or greater than 54 to demonstrate statistically significant improvement in mobility and quality of life.    Baseline 06/06/2020:  46 (risk-adjusted 45); 5/11 73%    Time 12    Period Weeks    Status Achieved      PT LONG TERM GOAL #2   Title Patient will increase 10 meter walk test to >1.72m/s as to improve gait speed for better community ambulation and to reduce fall risk.    Baseline 06/06/2020: 0.59 m/s with SPC; 5/11: 0.83 m/s SPC    Time 12    Period Weeks    Status New      PT LONG TERM GOAL #3   Title Patient will increase Berg Balance score by > 6 points to demonstrate decreased fall risk during functional activities.    Baseline 06/06/2020: 40/56;  5/11: 46/56    Time 12    Period Weeks    Status New      PT LONG TERM GOAL #4   Title Patient (> 23 years old) will complete five times sit to stand test in < 15 seconds indicating an increased LE strength and improved balance.    Baseline 06/06/2020: 18 sec performed with BUE assist off chair; 5/11: 12 seconds using BUEs    Time 12    Period Weeks    Status Achieved      PT LONG TERM GOAL #5   Title Patient will reduce dizziness handicap inventory score by at least 18 points, for less dizziness with ADLs and increased safety with home and work tasks.    Baseline 06/06/2020: 48% (moderate handicap);    Time 12    Period Weeks    Status Achieved      PT LONG TERM GOAL #6   Title Patient will increase Functional Gait Assessment score to >20/30 as to reduce fall risk and improve dynamic gait safety with community ambulation.  Baseline 06/13/2020: 15/30    Time 11    Period Weeks    Status New                  Patient will benefit from skilled therapeutic intervention in order to improve the following deficits and impairments:     Visit Diagnosis: Unsteadiness on feet  Other abnormalities of gait and mobility  Muscle weakness (generalized)     Problem List Patient Active Problem List   Diagnosis Date Noted  . Obstructive sleep apnea 06/29/2020  . Urge incontinence of urine 12/22/2019  . Trifascicular block 10/20/2019  . Wound  infection 10/08/2019  . Bifascicular block 05/13/2019  . Coronary stent patent 04/13/2019  . Chronic HFrEF (heart failure with reduced ejection fraction) ([place]) 03/19/2019  . PSVT (paroxysmal supraventricular tachycardia) ([place]) 03/19/2019  . 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 CKD (chronic kidney disease), stage IV ([place]) 06/04/2018  . Osteoarthritis of knees, bilateral 06/02/2018  . Hyperlipidemia associated with type 2 diabetes mellitus ([place]) 05/06/2018  . Pain of lower extremity 03/06/2018  . Duodenitis 01/30/2018  . Chronic heart failure with preserved ejection fraction (HFpEF) ([place]) 01/23/2018  . Hematochezia   . Melena   . Rectal bleeding 01/11/2018  . AVM (arteriovenous malformation) of colon without hemorrhage   . Schatzki's ring   . Esophageal dysphagia   . Diabetic retinopathy ([place]) 07/08/2017  . Ischemic cardiomyopathy 06/26/2017  . Cardiomyopathy ([place]) 06/26/2017  . Centrilobular emphysema ([place]) 06/13/2017  . Fatigue 12/18/2016  . Stable angina ([place]) 09/20/2016  . Stage 3a chronic kidney disease ([place]) 09/20/2016  . Paroxysmal atrial fibrillation ([place]) 08/28/2016  . Chronic anticoagulation 08/28/2016  . Essential hypertension 08/20/2016  . 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 heart with stable angina pectoris ([place]) 08/19/2016  . Accelerating angina ([place]) 08/15/2016  . Exertional dyspnea 08/12/2016  . 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[place]) 12/29/2015  . Gynecomastia, male 03/31/2014  . Hypertrophy of breast 03/31/2014  . Nuclear sclerosis of left eye 10/22/2013  . Enthesopathy of ankle and tarsus 04/10/2013  . Controlled type 2 diabetes mellitus with diabetic nephropathy ([place]) 04/09/2013  . Diabetic neuropathy associated with type 2 diabetes mellitus ([place]) 04/09/2013  . Cataract of left eye 11/25/2011  . Corneal opacity 09/30/2011  . Pseudophakia of right eye 09/30/2011  . GERD (gastroesophageal reflux disease) 09/25/2011  . Gout  09/25/2011  . Hyperlipidemia LDL goal <70 09/25/2011  . Status post cataract extraction 04/11/2011  . Cataract extraction status of eye 04/11/2011  . Iron deficiency anemia 04/01/2011  . Benign localized hyperplasia of prostate without urinary obstruction and other lower urinary tract symptoms (LUTS) 12/10/2010  . Enlarged prostate without lower urinary tract symptoms (luts) 12/10/2010  . Colon cancer screening 10/11/2010  . Hypothyroidism 10/11/2010  . Hyperlipidemia 09/20/2003  . Essential and other specified forms of tremor 01/05/2001  . Essential tremor 01/05/2001   Ricard Dillon PT, DPT 07/26/2020, 3:50 PM  [place] MAIN [facility] SERVICES [address] [place], [place], [address] Phone: [phone]   Fax:  [phone]  Name: WYATT THORSTENSON MRN: [id number] Date of Birth: [birth year]

## 2020-07-28 ENCOUNTER — Encounter: Payer: Self-pay | Admitting: Family Medicine

## 2020-07-28 ENCOUNTER — Other Ambulatory Visit: Payer: Self-pay

## 2020-07-28 ENCOUNTER — Ambulatory Visit: Payer: Medicare HMO

## 2020-07-28 ENCOUNTER — Ambulatory Visit (INDEPENDENT_AMBULATORY_CARE_PROVIDER_SITE_OTHER): Payer: Medicare HMO | Admitting: Family Medicine

## 2020-07-28 VITALS — BP 114/57 | HR 79 | Temp 98.3°F | Ht 66.0 in | Wt 197.0 lb

## 2020-07-28 DIAGNOSIS — M6281 Muscle weakness (generalized): Secondary | ICD-10-CM | POA: Diagnosis not present

## 2020-07-28 DIAGNOSIS — J0110 Acute frontal sinusitis, unspecified: Secondary | ICD-10-CM | POA: Diagnosis not present

## 2020-07-28 DIAGNOSIS — G4733 Obstructive sleep apnea (adult) (pediatric): Secondary | ICD-10-CM | POA: Diagnosis not present

## 2020-07-28 DIAGNOSIS — R2689 Other abnormalities of gait and mobility: Secondary | ICD-10-CM

## 2020-07-28 DIAGNOSIS — R2681 Unsteadiness on feet: Secondary | ICD-10-CM

## 2020-07-28 MED ORDER — AMOXICILLIN-POT CLAVULANATE 875-125 MG PO TABS
1.0000 | ORAL_TABLET | Freq: Two times a day (BID) | ORAL | 0 refills | Status: DC
Start: 2020-07-28 — End: 2020-08-22

## 2020-07-28 NOTE — Therapy (Signed)
[place] MAIN [facility] SERVICES [address] [place], [place], [address] Phone: [phone]   Fax:  [phone]  Physical Therapy Treatment  Patient Details  Name: Lucas Caldwell MRN: [id number] Date of Birth: 1934/03/12 Referring Provider (PT): [name], DO   Encounter Date: 07/28/2020    Past Medical History:  Diagnosis Date  . Arthritis    "probably in his legs" (08/15/2016)  . Blind right eye   . BPH (benign prostatic hyperplasia)   . Breast asymmetry    Left breast is larger, present for several years.  [name] CAD (coronary artery disease)    a. Cath in the late 90's - reportedly ok;  b. 2014 s/p stenting x 2 @ [facility]; c 08/19/16 Cath/PCI with DES -> RCA, plan to treat LM 70% medically. Seen by surgery and felt to be too high risk for CABG; d. 08/2018 Cath: LM 70 (iFR 0.86), LAD 20ost, 40p, 50/18m D1 70, LCX patent stent, OM1 30, RCA patent stent, 458mSR, 40d, RPDA 30. PCWP 8. CO/CI 4.0/2.1; e. 03/2019 PCI to LAD (2.5x15 Resolute Onyx DES).  . Chronic combined systolic (congestive) and diastolic (congestive) heart failure (H[place]   a. Previously reduced EF-->50% by echo in 2012;  b. 06/2015 Echo: EF 50-55%  c. 07/2016 Echo: EF 45-50%; d. 12/2017 Echo: EF 55%, Gr1 DD, mild MR; e. 08/2018 Echo: EF 35-40%, mildly dil PA; f. 04/2019 Echo: EF 30-35%, glob HK, nl RV fxn; g. s/p Cardiomems.  . Chronic kidney disease (CKD), stage IV (severe) (HCHannas[place]  . CML (chronic myelocytic leukemia) (HCFairmount [place]  . GERD (gastroesophageal reflux disease)   . GIB (gastrointestinal bleeding)    a. 12/2017 3 unit PRBC GIB in setting of coumadin-->Endo/colon multiple duodenal ulcers and a single bleeding ulcer in the proximal ascending colon status post hemostatic clipping x2.  . Gout   . Hyperlipemia   . Hypertension   . Hypothyroidism   . Iron deficiency anemia   . Ischemic cardiomyopathy    a. Previously reduced EF-->50% by echo in 2012;  b. 06/2015 Echo: EF 50-55%,  Gr2 DD, mild MR, mildly dil LA, Ao sclerosis, mild TR;  c. 07/2016 Echo: EF 45-50%; d. 12/2017 Echo: EF 55%, Gr1 DD, mild MR; e. 08/2018 Echo: EF 35-40%; f. 04/2019 Echo: EF 30-35%.  . Migraine    "in the 1960s" (08/15/2016)  . Obstructive sleep apnea   . PAF (paroxysmal atrial fibrillation) (HCC)    a. ? Dx 2014-->s/p DCCV;  b. CHA2DS2VASc = 6--> Coumadin.  . Prostate cancer (HCWarrenville  . RBBB   . Type II diabetes mellitus (HCWest Springfield    Past Surgical History:  Procedure Laterality Date  . CATARACT EXTRACTION W/ INTRAOCULAR LENS  IMPLANT, BILATERAL Bilateral   . COLONOSCOPY N/A 01/13/2018   Procedure: COLONOSCOPY;  Surgeon: VaLin LandsmanMD;  [facility];  Service: Gastroenterology;  Laterality: N/A;  . COLONOSCOPY WITH PROPOFOL N/A 01/01/2018   Procedure: COLONOSCOPY WITH PROPOFOL;  Surgeon: VaLin LandsmanMD;  [facility];  Service: Gastroenterology;  Laterality: N/A;  . CORONARY ANGIOPLASTY WITH STENT PLACEMENT  09/2012   2 stents  . CORONARY STENT INTERVENTION N/A 08/19/2016   Procedure: Coronary Stent Intervention;  Surgeon: EnNelva BushMD;  Location[facility] [place] LAB;  Service: Cardiovascular;  Laterality: N/A;  . CORONARY STENT INTERVENTION N/A 04/13/2019   Procedure: CORONARY STENT INTERVENTION;  Surgeon: EnNelva BushMD;  Location: A[facility] LAB;  Service: Cardiovascular;  Laterality: N/A;  . ESOPHAGEAL MANOMETRY N/A 12/16/2017   Procedure: ESOPHAGEAL MANOMETRY (EM);  Surgeon: Lin Landsman, MD;  Location: [facility] ENDOSCOPY;  Service: Gastroenterology;  Laterality: N/A;  . ESOPHAGOGASTRODUODENOSCOPY N/A 12/20/2016   Procedure: ESOPHAGOGASTRODUODENOSCOPY (EGD);  Surgeon: Lin Landsman, MD;  Location[facility];  Service: Gastroenterology;  Laterality: N/A;  . ESOPHAGOGASTRODUODENOSCOPY N/A 01/13/2018   Procedure: ESOPHAGOGASTRODUODENOSCOPY (EGD);  Surgeon: Lin Landsman, MD;  Location: [facility] ENDOSCOPY;  Service:  Gastroenterology;  Laterality: N/A;  . ESOPHAGOGASTRODUODENOSCOPY (EGD) WITH PROPOFOL N/A 11/03/2017   Procedure: ESOPHAGOGASTRODUODENOSCOPY (EGD) WITH PROPOFOL;  Surgeon: Lin Landsman, MD;  Location: [facility] END[facility]COPY;  Service: Gastroenterology;  Laterality: N/A;  . EYE SURGERY    . HERNIA REPAIR     "navel"  . LEFT HEART CATH AND CORONARY ANGIOGRAPHY N/A 08/14/2016   Procedure: Left Heart Cath and Coronary Angiography;  Surgeon: [name], MD;  Location: [place] CV LAB;  Service: Cardiovascular;  Laterality: N/A;  . PRESSURE SENSOR/CARDIOMEMS N/A 09/23/2019   Procedure: PRESSURE SENSOR/CARDIOMEMS;  Surgeon: Jolaine Artist, MD;  Location: [place] CV LAB;  Service: Cardiovascular;  Laterality: N/A;  . RIGHT HEART CATH N/A 09/23/2019   Procedure: RIGHT HEART CATH;  Surgeon: Jolaine Artist, MD;  Location: [place] CV LAB;  Service: Cardiovascular;  Laterality: N/A;  . RIGHT/LEFT HEART CATH AND CORONARY ANGIOGRAPHY N/A 09/01/2018   Procedure: RIGHT/LEFT HEART CATH AND CORONARY ANGIOGRAPHY;  Surgeon: [name], MD;  Location: [place] CV LAB;  Service: Cardiovascular;  Laterality: N/A;  . RIGHT/LEFT HEART CATH AND CORONARY ANGIOGRAPHY N/A 04/13/2019   Procedure: RIGHT/LEFT HEART CATH AND CORONARY ANGIOGRAPHY;  Surgeon: [name], MD;  Location: [place] CV LAB;  Service: Cardiovascular;  Laterality: N/A;  . TOE AMPUTATION Right    "big toe"    There were no vitals filed for this visit.   Subjective Assessment - 07/28/20 1053    Subjective Pt reports no pain or falls or near falls. Pt reports his sister passed away on 06-07-2022.    Patient is accompained by: Family member    Pertinent History Pt is an 87 y/o male presenting to physical therapy today with spouse with reports of difficulty with balance, dizziness, and walking. Pt states he started feeling "swimmy headed" approximately one year ago and that it is affecting his balance with walking  and has been progressively worsening. Pt reports he feels he is moving when he's still. He denies spinning sensation. He reports he sometimes has headache but says "they are not that bad." Pt reports feeling SOB with walking and household tasks (his doctor is aware). Pt denies unintentional weight gain or loss. Pt reports 1 fall the last 6 months. Per spouse pt tripped at time of fall and do not provide further details. Pt with extensive PMH. 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    Limitations House hold activities;Lifting;Standing;Walking;Writing   Pt reports when he tries to write his hand shakes  a lot   How long can you sit comfortably? not limited    How long can you stand comfortably? 5 minutes    How long can you walk comfortably? 10 minutes    Diagnostic tests DG Chest 2 View  from 07/06/2019 per chart: "  IMPRESSION:  No acute abnormalities."    Patient Stated Goals Feel more steady with walking           Treatment:  FGA: 17/30  Dix-Hallpike test: negative B, no sx reported, although unclear if some beating present.  Roll testing: negative B   Ambulation with horizontal head turns 7x (over 10 meters) with SPC, close CGA. Denies feeling light-headed.  Ambulation with vertical head turns, SPC,  close CGA-min assist as pt staggers to help him maintain balance- 8x back and forth.    Education provided throughout in the form of demo, VC/TC to facilitate movement at target joints and correct muscle activation with exercises. Pt demonstrates good carryover within session with cues.   PT Short Term Goals - 07/26/20 1550      PT SHORT TERM GOAL #1   Title Patient will be independent in home exercise program to improve strength/mobility for better functional independence with ADLs.    Baseline 06/06/2020: HEP to be initiated next visit; 5/11: Pt HEP to be updated/advanced.    Time 6    Period Weeks    Status On-going    Target Date 09/06/20             PT Long Term Goals - 07/26/20 1435      PT LONG TERM GOAL #1   Title Patient will increase FOTO score to equal to or greater than 54 to demonstrate statistically significant improvement in mobility and quality of life.    Baseline 06/06/2020: 46 (risk-adjusted 45); 5/11 73%    Time 12    Period Weeks    Status Achieved      PT LONG TERM GOAL #2   Title Patient will increase 10 meter walk test to >1.75ms as to improve gait speed for better community ambulation and to reduce fall risk.    Baseline 06/06/2020: 0.59 m/s with SPC; 5/11: 0.83 m/s SPC    Time 12    Period Weeks    Status On-going    Target Date 08/29/20      PT LONG TERM GOAL #3   Title Patient will increase Berg Balance score by > 6 points to demonstrate decreased fall risk during functional activities.    Baseline 06/06/2020: 40/56;  5/11: 46/56    Time 12    Period Weeks    Status Partially Met    Target Date 08/29/20      PT LONG TERM GOAL #4   Title Patient (> 679years old) will complete five times sit to stand test in < 15 seconds indicating an increased LE strength and improved balance.    Baseline 06/06/2020: 18 sec performed with BUE assist off chair; 5/11: 12 seconds using BUEs    Time 12    Period Weeks    Status Achieved    Target Date 08/29/20       PT LONG TERM GOAL #5   Title Patient will reduce dizziness handicap inventory score by at least 18 points, for less dizziness with ADLs and increased safety with home and work tasks.    Baseline 06/06/2020: 48% (moderate handicap); 5/11: 36%    Time 12    Period Weeks  Status On-going    Target Date 08/29/20      PT LONG TERM GOAL #6   Title Patient will increase Functional Gait Assessment score to >20/30 as to reduce fall risk and improve dynamic gait safety with community ambulation.    Baseline 06/13/2020: 15/30 5/11: deferred this session due to time    Time 11    Period Weeks    Status On-going    Target Date 08/29/20                  Patient will benefit from skilled therapeutic intervention in order to improve the following deficits and impairments:     Visit Diagnosis: No diagnosis found.     Problem List Patient Active Problem List   Diagnosis Date Noted  . Obstructive sleep apnea 06/29/2020  . Urge incontinence of urine 12/22/2019  . Trifascicular block 10/20/2019  . Wound infection 10/08/2019  . Bifascicular block 05/13/2019  . Coronary stent patent 04/13/2019  . Chronic HFrEF (heart failure with reduced ejection fraction) ([place]) 03/19/2019  . PSVT (paroxysmal supraventricular tachycardia) ([place]) 03/19/2019  . 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 CKD (chronic kidney disease), stage IV ([place]) 06/04/2018  . Osteoarthritis of knees, bilateral 06/02/2018  . Hyperlipidemia associated with type 2 diabetes mellitus ([place]) 05/06/2018  . Pain of lower extremity 03/06/2018  . Duodenitis 01/30/2018  . Chronic heart failure with preserved ejection fraction (HFpEF) ([place]) 01/23/2018  . Hematochezia   . Melena   . Rectal bleeding 01/11/2018  . AVM (arteriovenous malformation) of colon without hemorrhage   . Schatzki's ring   . Esophageal dysphagia   . Diabetic retinopathy ([place]) 07/08/2017  . Ischemic cardiomyopathy 06/26/2017   . Cardiomyopathy ([place]) 06/26/2017  . Centrilobular emphysema ([place]) 06/13/2017  . Fatigue 12/18/2016  . Stable angina ([place]) 09/20/2016  . Stage 3a chronic kidney disease ([place]) 09/20/2016  . Paroxysmal atrial fibrillation ([place]) 08/28/2016  . Chronic anticoagulation 08/28/2016  . Essential hypertension 08/20/2016  . 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 heart with stable angina pectoris ([place]) 08/19/2016  . Accelerating angina ([place]) 08/15/2016  . Exertional dyspnea 08/12/2016  . 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[place]) 12/29/2015  . Gynecomastia, male 03/31/2014  . Hypertrophy of breast 03/31/2014  . Nuclear sclerosis of left eye 10/22/2013  . Enthesopathy of ankle and tarsus 04/10/2013  . Controlled type 2 diabetes mellitus with diabetic nephropathy ([place]) 04/09/2013  . Diabetic neuropathy associated with type 2 diabetes mellitus ([place]) 04/09/2013  . Cataract of left eye 11/25/2011  . Corneal opacity 09/30/2011  . Pseudophakia of right eye 09/30/2011  . GERD (gastroesophageal reflux disease) 09/25/2011  . Gout 09/25/2011  . Hyperlipidemia LDL goal <70 09/25/2011  . Status post cataract extraction 04/11/2011  . Cataract extraction status of eye 04/11/2011  . Iron deficiency anemia 04/01/2011  . Benign localized hyperplasia of prostate without urinary obstruction and other lower urinary tract symptoms (LUTS) 12/10/2010  . Enlarged prostate without lower urinary tract symptoms (luts) 12/10/2010  . Colon cancer screening 10/11/2010  . Hypothyroidism 10/11/2010  . Hyperlipidemia 09/20/2003  . Essential and other specified forms of tremor 01/05/2001  . Essential tremor 01/05/2001   Ricard Dillon PT, DPT  07/28/2020, 11:19 AM  [place] MAIN [facility] SERVICES [address] [place], [place], [address] Phone: [phone]   Fax:  [phone]  Name: DOMANI BAKOS MRN: [id number] Date of Birth: 20-Jan-1934

## 2020-07-28 NOTE — Patient Instructions (Addendum)
Thank you for coming to the office today.  OTC - Start nasal steroid Flonase 2 sprays in each nostril daily for 4-6 weeks, may repeat course seasonally or as needed  Start Augmentin antibiotic for sinuses.  SHould help clear up congestion and drainage.  You have fluid behind the ear that is cloudy and likely caused by sinuses.  It can reduce your hearing.  Please schedule a Follow-up Appointment to: Return if symptoms worsen or fail to improve.  If you have any other questions or concerns, please feel free to call the office or send a message through [place]. You may also schedule an earlier appointment if necessary.  Additionally, you may be receiving a survey about your experience at our office within a few days to 1 week by e-mail or mail. We value your feedback.  [name], DO [place]

## 2020-07-28 NOTE — Progress Notes (Signed)
Subjective:    Patient ID: Lucas Caldwell, male    DOB: 05/24/1933, 87 y.o.   MRN: [id number]  Lucas Caldwell is a 87 y.o. male presenting on 07/28/2020 for Ear Pain (X1-2 months, Left ear, no painful, feels like its stopped up ) and Sinusitis (Coughing with white mucous, no fever )   HPI   Sinusitis / Eustachian Tube Dysfunction Effusion Reports >1 month of sinusitis symptoms with inc pressure in sinuses He has had sinus drainage, and ear pressure fullness L>R Some reduced hearing and pressure Admits occasional cough Not running fever Denies nausea vomiting body aches loss of taste smell, headache dyspnea   Depression screen [facility] 2/9 07/28/2020 12/22/2019 10/25/2019  Decreased Interest 0 0 2  Down, Depressed, Hopeless 0 0 0  PHQ - 2 Score 0 0 2  Altered sleeping 2 - 2  Tired, decreased energy 0 - 1  Change in appetite 0 - 0  Feeling bad or failure about yourself  0 - 1  Trouble concentrating 0 - 0  Moving slowly or fidgety/restless 0 - 0  Suicidal thoughts 0 - 0  PHQ-9 Score 2 - 6  Difficult doing work/chores - - Somewhat difficult  Some recent data might be hidden    Social History   Tobacco Use  . Smoking status: Former Smoker    Packs/day: 1.75    Years: 20.00    Pack years: 35.00    Types: Cigarettes    Start date: 1955    Quit date: 1975    Years since quitting: 47.3  . Smokeless tobacco: Former Network engineer  . Vaping Use: Never used  Substance Use Topics  . Alcohol use: No    Comment: previously drank heavily - quit 1979.  . Drug use: No    Review of Systems Per HPI unless specifically indicated above     Objective:    BP (!) 114/57   Pulse 79   Temp 98.3 F (36.8 C) (Oral)   Ht _0  (1.676 m)   Wt 197 lb (89.4 kg)   SpO2 100%   BMI 31.80 kg/m   Wt Readings from Last 3 Encounters:  07/28/20 197 lb (89.4 kg)  06/29/20 198 lb (89.8 kg)  06/14/20 203 lb 9.6 oz (92.4 kg)    Physical Exam Vitals and nursing note reviewed.   Constitutional:      General: [name] not in acute distress.    Appearance: [name] well-developed. [name] not diaphoretic.     Comments: Well-appearing, comfortable, cooperative  HENT:     Head: Normocephalic and atraumatic.     Right Ear: Ear canal and external ear normal.     Left Ear: Ear canal and external ear normal. There is no impacted cerumen.     Ears:     Comments: TM full with effusion Eyes:     General:        Right eye: No discharge.        Left eye: No discharge.     Conjunctiva/sclera: Conjunctivae normal.  Cardiovascular:     Rate and Rhythm: Normal rate.  Pulmonary:     Effort: Pulmonary effort is normal.  Skin:    General: Skin is warm and dry.     Findings: No erythema or rash.  Neurological:     Mental Status: [name] alert and oriented to person, place, and time.  Psychiatric:        Behavior: Behavior normal.  Comments: Well groomed, good eye contact, normal speech and thoughts    Results for orders placed or performed in visit on 81/19/14  Basic metabolic panel  Result Value Ref Range   Glucose 156 (H) 65 - 99 mg/dL   BUN 22 8 - 27 mg/dL   Creatinine, Ser 1.55 (H) 0.76 - 1.27 mg/dL   eGFR 44 (L) >59 mL/min/1.73   BUN/Creatinine Ratio 14 10 - 24   Sodium 134 134 - 144 mmol/L   Potassium 4.6 3.5 - 5.2 mmol/L   Chloride 100 96 - 106 mmol/L   CO2 17 (L) 20 - 29 mmol/L   Calcium 9.2 8.6 - 10.2 mg/dL  CBC  Result Value Ref Range   WBC 7.0 3.4 - 10.8 x10E3/uL   RBC 4.68 4.14 - 5.80 x10E6/uL   Hemoglobin 13.2 13.0 - 17.7 g/dL   Hematocrit 39.4 37.5 - 51.0 %   MCV 84 79 - 97 fL   MCH 28.2 26.6 - 33.0 pg   MCHC 33.5 31.5 - 35.7 g/dL   RDW 15.6 (H) 11.6 - 15.4 %   Platelets 182 150 - 450 x10E3/uL  Lipid panel  Result Value Ref Range   Cholesterol, Total 123 100 - 199 mg/dL   Triglycerides 170 (H) 0 - 149 mg/dL   HDL 29 (L) >39 mg/dL   VLDL Cholesterol Cal 29 5 - 40 mg/dL   LDL Chol Calc (NIH) 65 0 - 99 mg/dL   Chol/HDL Ratio 4.2 0.0 - 5.0 ratio    *Note: Due to a large number of results and/or encounters for the requested time period, some results have not been displayed. A complete set of results can be found in Results Review.      Assessment & Plan:   Problem List Items Addressed This Visit   None   Visit Diagnoses    Acute non-recurrent frontal sinusitis    -  Primary   Relevant Medications   amoxicillin-clavulanate (AUGMENTIN) 875-125 MG tablet       Consistent with acute frontal rhinosinusitis, likely initially viral URI vs allergic rhinitis component with worsening concern for bacterial infection.   Plan: 1 Start Augmentin 875-12m PO BID x 10 days 2. Start nasal steroid Flonase 2 sprays in each nostril daily for 4-6 weeks, may repeat course seasonally or as needed Return criteria reviewed   Meds ordered this encounter  Medications  . amoxicillin-clavulanate (AUGMENTIN) 875-125 MG tablet    Sig: Take 1 tablet by mouth 2 (two) times daily.    Dispense:  20 tablet    Refill:  0    Follow up plan: Return if symptoms worsen or fail to improve.   ANobie Putnam DPrior LakeGroup 07/28/2020, 2:33 PM

## 2020-08-01 ENCOUNTER — Other Ambulatory Visit: Payer: Self-pay

## 2020-08-01 ENCOUNTER — Ambulatory Visit: Payer: Medicare HMO

## 2020-08-01 DIAGNOSIS — R2689 Other abnormalities of gait and mobility: Secondary | ICD-10-CM

## 2020-08-01 DIAGNOSIS — M6281 Muscle weakness (generalized): Secondary | ICD-10-CM

## 2020-08-01 DIAGNOSIS — R2681 Unsteadiness on feet: Secondary | ICD-10-CM | POA: Diagnosis not present

## 2020-08-01 DIAGNOSIS — G4733 Obstructive sleep apnea (adult) (pediatric): Secondary | ICD-10-CM | POA: Diagnosis not present

## 2020-08-01 NOTE — Therapy (Signed)
[place] MAIN [facility] SERVICES [address] [place], [place], [address] Phone: [phone]   Fax:  [phone]  Physical Therapy Treatment  Patient Details  Name: Lucas Caldwell MRN: [id number] Date of Birth: 1933-04-14 Referring Provider (PT): [name], DO   Encounter Date: 08/01/2020   PT End of Session - 08/01/20 1303    Visit Number 12    Number of Visits 25    Date for PT Re-Evaluation 08/29/20    Authorization Time Period eval performed on 06/06/2020    PT Start Time 1150    PT Stop Time 1229    PT Time Calculation (min) 39 min    Equipment Utilized During Treatment Gait belt    Activity Tolerance Patient tolerated treatment well    Behavior During Therapy [facility] for tasks assessed/performed           Past Medical History:  Diagnosis Date  . Arthritis    "probably in his legs" (08/15/2016)  . Blind right eye   . BPH (benign prostatic hyperplasia)   . Breast asymmetry    Left breast is larger, present for several years.  [name] CAD (coronary artery disease)    a. Cath in the late 90's - reportedly ok;  b. 2014 s/p stenting x 2 @ [facility]; c 08/19/16 Cath/PCI with DES -> RCA, plan to treat LM 70% medically. Seen by surgery and felt to be too high risk for CABG; d. 08/2018 Cath: LM 70 (iFR 0.86), LAD 20ost, 40p, 50/49m D1 70, LCX patent stent, OM1 30, RCA patent stent, 477mSR, 40d, RPDA 30. PCWP 8. CO/CI 4.0/2.1; e. 03/2019 PCI to LAD (2.5x15 Resolute Onyx DES).  . Chronic combined systolic (congestive) and diastolic (congestive) heart failure (H[place]   a. Previously reduced EF-->50% by echo in 2012;  b. 06/2015 Echo: EF 50-55%  c. 07/2016 Echo: EF 45-50%; d. 12/2017 Echo: EF 55%, Gr1 DD, mild MR; e. 08/2018 Echo: EF 35-40%, mildly dil PA; f. 04/2019 Echo: EF 30-35%, glob HK, nl RV fxn; g. s/p Cardiomems.  . Chronic kidney disease (CKD), stage IV (severe) (HCC[place]  . CML (chronic myelocytic leukemia) (HCNew [place]  . GERD (gastroesophageal  reflux disease)   . GIB (gastrointestinal bleeding)    a. 12/2017 3 unit PRBC GIB in setting of coumadin-->Endo/colon multiple duodenal ulcers and a single bleeding ulcer in the proximal ascending colon status post hemostatic clipping x2.  . Gout   . Hyperlipemia   . Hypertension   . Hypothyroidism   . Iron deficiency anemia   . Ischemic cardiomyopathy    a. Previously reduced EF-->50% by echo in 2012;  b. 06/2015 Echo: EF 50-55%, Gr2 DD, mild MR, mildly dil LA, Ao sclerosis, mild TR;  c. 07/2016 Echo: EF 45-50%; d. 12/2017 Echo: EF 55%, Gr1 DD, mild MR; e. 08/2018 Echo: EF 35-40%; f. 04/2019 Echo: EF 30-35%.  . Migraine    "in the 1960s" (08/15/2016)  . Obstructive sleep apnea   . PAF (paroxysmal atrial fibrillation) (HCC)    a. ? Dx 2014-->s/p DCCV;  b. CHA2DS2VASc = 6--> Coumadin.  . Prostate cancer (HCFair[place]  . RBBB   . Type II diabetes mellitus (HCR[place]emed[place]    Past Surgical History:  Procedure Laterality Date  . CATARACT EXTRACTION W/ INTRAOCULAR LENS  IMPLANT, BILATERAL Bilateral   . COLONOSCOPY N/A 01/13/2018   Procedure: COLONOSCOPY;  Surgeon: VaLin LandsmanMD;  [facility];  Service: Gastroenterology;  Laterality: N/A;  .  COLONOSCOPY WITH PROPOFOL N/A 01/01/2018   Procedure: COLONOSCOPY WITH PROPOFOL;  Surgeon: Lin Landsman, MD;  Location: [facility] ENDOSCOPY;  Service: Gastroenterology;  Laterality: N/A;  . CORONARY ANGIOPLASTY WITH STENT PLACEMENT  09/2012   2 stents  . CORONARY STENT INTERVENTION N/A 08/19/2016   Procedure: Coronary Stent Intervention;  Surgeon: [name], MD;  Location: [place] CV LAB;  Service: Cardiovascular;  Laterality: N/A;  . CORONARY STENT INTERVENTION N/A 04/13/2019   Procedure: CORONARY STENT INTERVENTION;  Surgeon: [name], MD;  Location: [place] CV LAB;  Service: Cardiovascular;  Laterality: N/A;  . ESOPHAGEAL MANOMETRY N/A 12/16/2017   Procedure: ESOPHAGEAL MANOMETRY (EM);  Surgeon: Lin Landsman, MD;   Location: [facility] ENDOSCOPY;  Service: Gastroenterology;  Laterality: N/A;  . ESOPHAGOGASTRODUODENOSCOPY N/A 12/20/2016   Procedure: ESOPHAGOGASTRODUODENOSCOPY (EGD);  Surgeon: Lin Landsman, MD;  [facility];  Service: Gastroenterology;  Laterality: N/A;  . ESOPHAGOGASTRODUODENOSCOPY N/A 01/13/2018   Procedure: ESOPHAGOGASTRODUODENOSCOPY (EGD);  Surgeon: Lin Landsman, MD;  Location: [facility] ENDOSCOPY;  Service: Gastroenterology;  Laterality: N/A;  . ESOPHAGOGASTRODUODENOSCOPY (EGD) WITH PROPOFOL N/A 11/03/2017   Procedure: ESOPHAGOGASTRODUODENOSCOPY (EGD) WITH PROPOFOL;  Surgeon: Lin Landsman, MD;  Location[facility] END[facility]COPY;  Service: Gastroenterology;  Laterality: N/A;  . EYE SURGERY    . HERNIA REPAIR     "navel"  . LEFT HEART CATH AND CORONARY ANGIOGRAPHY N/A 08/14/2016   Procedure: Left Heart Cath and Coronary Angiography;  Surgeon: [name], MD;  Location: [place] CV LAB;  Service: Cardiovascular;  Laterality: N/A;  . PRESSURE SENSOR/CARDIOMEMS N/A 09/23/2019   Procedure: PRESSURE SENSOR/CARDIOMEMS;  Surgeon: Jolaine Artist, MD;  Location: [place] CV LAB;  Service: Cardiovascular;  Laterality: N/A;  . RIGHT HEART CATH N/A 09/23/2019   Procedure: RIGHT HEART CATH;  Surgeon: Jolaine Artist, MD;  Location: [place] CV LAB;  Service: Cardiovascular;  Laterality: N/A;  . RIGHT/LEFT HEART CATH AND CORONARY ANGIOGRAPHY N/A 09/01/2018   Procedure: RIGHT/LEFT HEART CATH AND CORONARY ANGIOGRAPHY;  Surgeon: [name], MD;  Location: [place] CV LAB;  Service: Cardiovascular;  Laterality: N/A;  . RIGHT/LEFT HEART CATH AND CORONARY ANGIOGRAPHY N/A 04/13/2019   Procedure: RIGHT/LEFT HEART CATH AND CORONARY ANGIOGRAPHY;  Surgeon: [name], MD;  Location: [place] CV LAB;  Service: Cardiovascular;  Laterality: N/A;  . TOE AMPUTATION Right    "big toe"    There were no vitals filed for this visit.   Subjective Assessment -  08/01/20 1151    Subjective Pt reports he was given a nose spray and something for his ears at his doctor appointment last Friday and is feeling better. He says he got up early this morning and is feeling a little more short-winded than usual.    Patient is accompained by: Family member    Pertinent History Pt is an 87 y/o male presenting to physical therapy today with spouse with reports of difficulty with balance, dizziness, and walking. Pt states he started feeling "swimmy headed" approximately one year ago and that it is affecting his balance with walking and has been progressively worsening. Pt reports he feels he is moving when he's still. He denies spinning sensation. He reports he sometimes has headache but says "they are not that bad." Pt reports feeling SOB with walking and household tasks (his doctor is aware). Pt denies unintentional weight gain or loss. Pt reports 1 fall the last 6 months. Per spouse pt tripped at time of fall and do not provide further details. Pt with  extensive PMH. 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    Limitations House hold activities;Lifting;Standing;Walking;Writing   Pt reports when he tries to write his hand shakes a lot   How long can you sit comfortably? not limited    How long  can you stand comfortably? 5 minutes    How long can you walk comfortably? 10 minutes    Diagnostic tests DG Chest 2 View  from 07/06/2019 per chart: "  IMPRESSION:  No acute abnormalities."    Patient Stated Goals Feel more steady with walking    Currently in Pain? No/denies          TREATMENT   Neuro Re-education: close CGA provided throughout  Walking while reading numbers, letters, symbols on wall with horizontal head turns 6x up and down hallway, 2x without head turns/reading.  HR 98 bpm, SPO2% 99  Rest break  Ambulation with vertical head turns - 6x with/without SPC; pt exhibits variable BOS . HR 90s bpm, SPO2 100%. Within one minute HR down to 81 bpm. Pt rates exercise "medium"  Therex  BP, seated, LUE: 160/61 HR 61 bpm  STS with UUE support - 1x10, 2x5 with no UE support; pt rates exercise "hard," with first set. Require rest breaks  Standing heel raises at support surface - 1x15. VC/Demo for technique  Seated marches with 2.5# AW 3x15; pt rates "medium"  Education provided throughout session primarily in the form of VC/Demo to facilitate improved technique with muscle activation and movement at target joints. Pt exhibits good carryover within session after cuing.   Assessment: Pt with poor endurance today, requiring multiple seated rest breaks and slightly limiting session. Pt's HR reached 86-98 bpm with exercise but showed decrease within 1 minute of rest into 80s.  Pt SPO2% remained 98-100%. He reported no increase in dizziness from his baseline throughout session. Pt will benefit from further skilled therapy to improve balance, LE strength and gait to increase safety with all activity.      PT Short Term Goals - 07/26/20 1550      PT SHORT TERM GOAL #1   Title Patient will be independent in home exercise program to improve strength/mobility for better functional independence with ADLs.    Baseline 06/06/2020: HEP to be initiated next visit; 5/11: Pt HEP to be  updated/advanced.    Time 6    Period Weeks    Status On-going    Target Date 09/06/20             PT Long Term Goals - 07/26/20 1435      PT LONG TERM GOAL #1   Title Patient will increase FOTO score to equal to or greater than 54 to demonstrate statistically significant improvement in mobility and quality of life.    Baseline 06/06/2020: 46 (risk-adjusted 45); 5/11 73%    Time 12    Period Weeks    Status Achieved      PT LONG TERM  GOAL #2   Title Patient will increase 10 meter walk test to >1.40ms as to improve gait speed for better community ambulation and to reduce fall risk.    Baseline 06/06/2020: 0.59 m/s with SPC; 5/11: 0.83 m/s SPC    Time 12    Period Weeks    Status On-going    Target Date 08/29/20      PT LONG TERM GOAL #3   Title Patient will increase Berg Balance score by > 6 points to demonstrate decreased fall risk during functional activities.    Baseline 06/06/2020: 40/56;  5/11: 46/56    Time 12    Period Weeks    Status Partially Met    Target Date 08/29/20      PT LONG TERM GOAL #4   Title Patient (> 6107years old) will complete five times sit to stand test in < 15 seconds indicating an increased LE strength and improved balance.    Baseline 06/06/2020: 18 sec performed with BUE assist off chair; 5/11: 12 seconds using BUEs    Time 12    Period Weeks    Status Achieved    Target Date 08/29/20      PT LONG TERM GOAL #5   Title Patient will reduce dizziness handicap inventory score by at least 18 points, for less dizziness with ADLs and increased safety with home and work tasks.    Baseline 06/06/2020: 48% (moderate handicap); 5/11: 36%    Time 12    Period Weeks    Status On-going    Target Date 08/29/20      PT LONG TERM GOAL #6   Title Patient will increase Functional Gait Assessment score to >20/30 as to reduce fall risk and improve dynamic gait safety with community ambulation.    Baseline 06/13/2020: 15/30 5/11: deferred this session due to  time    Time 11    Period Weeks    Status On-going    Target Date 08/29/20                 Plan - 08/01/20 1309    Clinical Impression Statement Pt with poor endurance today, requiring multiple seated rest breaks and slightly limiting session. Pt's HR reached 86-98 bpm with exercise but showed decrease within 1 minute of rest into 80s.  Pt SPO2% remained 98-100%. He reported no increase in dizziness from his baseline throughout session. Pt will benefit from further skilled therapy to improve balance, LE strength and gait to increase safety with all activity.    Personal Factors and Comorbidities Age;Comorbidity 3+;Fitness;Comorbidity 1;Comorbidity 2;Past/Current Experience;Time since onset of injury/illness/exacerbation    Comorbidities Pertinent PMH/comorbidities per chart: Exertional dyspnea, centrilobular emphysema, angina, CAD, acute on chronic HFrEF, controlled DMII with diabetic nephropathy, diabetic neuropathy, gout, CKD, essential HTN, paroxysmal atrial fibrillation, chronic anticoagulation, CKD, fatigue, iron deficiency anemia, pain of LE, OA of B knees, hx of partial ray amputation of R great toe, s/p cataract extraction status of eye, enthesopathy of ankle and tarsus, GERD, hyperlipidemia, hypothyroidism, nuclear sclerosis of L eye, psedophakia of R eye, ischemic cardiomyopathy, esophageal dysphagia, chronic heart failure with preserved ejection fraction, PSVT, coronary stent patent, bifascicular block, trifascicular block, wound infection, urge incontinence of urine.    Examination-Activity Limitations Bathing;Bed Mobility;Bend;Caring for Others;Carry;Continence;Dressing;Hygiene/Grooming;Stairs;Squat;Reach Overhead;Locomotion Level;Lift;Stand;Toileting;Transfers    Examination-Participation Restrictions Medication Management;Cleaning;Meal Prep;Community Activity;Driving;Laundry;Shop;Yard Work;Volunteer    Stability/Clinical Decision Making Evolving/Moderate complexity    Rehab  Potential Good    PT Frequency 2x / week    PT  Duration 12 weeks    PT Treatment/Interventions ADLs/Self Care Home Management;Biofeedback;Canalith Repostioning;Cryotherapy;Electrical Stimulation;Moist Heat;Traction;Ultrasound;DME Instruction;Gait training;Stair training;Functional mobility training;Therapeutic activities;Therapeutic exercise;Balance training;Neuromuscular re-education;Patient/family education;Orthotic Fit/Training;Manual techniques;Wheelchair mobility training;Passive range of motion;Dry needling;Energy conservation;Taping;Vestibular;Joint Manipulations;Spinal Manipulations;Aquatic Therapy    PT Next Visit Plan dynamic balance, endurance    PT Home Exercise Plan advance HEP next session    Consulted and Agree with Plan of Care Patient;Family member/caregiver    Family Member Consulted spouse           Patient will benefit from skilled therapeutic intervention in order to improve the following deficits and impairments:  Abnormal gait,Dizziness,Increased fascial restricitons,Impaired sensation,Improper body mechanics,Pain,Cardiopulmonary status limiting activity,Decreased coordination,Decreased mobility,Postural dysfunction,Decreased activity tolerance,Decreased endurance,Decreased range of motion,Hypomobility,Decreased strength,Decreased balance,Difficulty walking,Impaired flexibility  Visit Diagnosis: Unsteadiness on feet  Other abnormalities of gait and mobility  Muscle weakness (generalized)     Problem List Patient Active Problem List   Diagnosis Date Noted  . Obstructive sleep apnea 06/29/2020  . Urge incontinence of urine 12/22/2019  . Trifascicular block 10/20/2019  . Wound infection 10/08/2019  . Bifascicular block 05/13/2019  . Coronary stent patent 04/13/2019  . Chronic HFrEF (heart failure with reduced ejection fraction) ([place]) 03/19/2019  . PSVT (paroxysmal supraventricular tachycardia) ([place]) 03/19/2019  . 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 CKD (chronic kidney disease), stage IV ([place]) 06/04/2018  . Osteoarthritis of knees, bilateral 06/02/2018  . Hyperlipidemia associated with type 2 diabetes mellitus ([place]) 05/06/2018  . Pain of lower extremity 03/06/2018  . Duodenitis 01/30/2018  . Chronic heart failure with preserved ejection fraction (HFpEF) ([place]) 01/23/2018  . Hematochezia   . Melena   . Rectal bleeding 01/11/2018  . AVM (arteriovenous malformation) of colon without hemorrhage   . Schatzki's ring   . Esophageal dysphagia   . Diabetic retinopathy ([place]) 07/08/2017  . Ischemic cardiomyopathy 06/26/2017  . Cardiomyopathy ([place]) 06/26/2017  . Centrilobular emphysema ([place]) 06/13/2017  . Fatigue 12/18/2016  . Stable angina ([place]) 09/20/2016  . Stage 3a chronic kidney disease ([place]) 09/20/2016  . Paroxysmal atrial fibrillation ([place]) 08/28/2016  . Chronic anticoagulation 08/28/2016  . Essential hypertension 08/20/2016  . 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 heart with stable angina pectoris ([place]) 08/19/2016  . Accelerating angina ([place]) 08/15/2016  . Exertional dyspnea 08/12/2016  . 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[place]) 12/29/2015  . Gynecomastia, male 03/31/2014  . Hypertrophy of breast 03/31/2014  . Nuclear sclerosis of left eye 10/22/2013  . Enthesopathy of ankle and tarsus 04/10/2013  . Controlled type 2 diabetes mellitus with diabetic nephropathy ([place]) 04/09/2013  . Diabetic neuropathy associated with type 2 diabetes mellitus ([place]) 04/09/2013  . Cataract of left eye 11/25/2011  . Corneal opacity 09/30/2011  . Pseudophakia of right eye 09/30/2011  . GERD (gastroesophageal reflux disease) 09/25/2011  . Gout 09/25/2011  . Hyperlipidemia LDL goal <70 09/25/2011  . Status post cataract extraction 04/11/2011  . Cataract extraction status of eye 04/11/2011  . Iron deficiency anemia 04/01/2011  . Benign localized hyperplasia of prostate without urinary obstruction and other lower urinary tract  symptoms (LUTS) 12/10/2010  . Enlarged prostate without lower urinary tract symptoms (luts) 12/10/2010  . Colon cancer screening 10/11/2010  . Hypothyroidism 10/11/2010  . Hyperlipidemia 09/20/2003  . Essential and other specified forms of tremor 01/05/2001  . Essential tremor 01/05/2001   Ricard Dillon PT, DPT 08/01/2020, 4:58 PM  [place] MAIN [facility] SERVICES [address] [place], [place], [address] Phone: [phone]  Fax:  [phone]  Name: Lucas Caldwell MRN: [id number] Date of Birth: 1933-07-24

## 2020-08-02 ENCOUNTER — Other Ambulatory Visit: Payer: Self-pay | Admitting: Internal Medicine

## 2020-08-04 ENCOUNTER — Other Ambulatory Visit: Payer: Self-pay

## 2020-08-04 ENCOUNTER — Other Ambulatory Visit (HOSPITAL_COMMUNITY): Payer: Self-pay

## 2020-08-04 ENCOUNTER — Ambulatory Visit: Payer: Medicare HMO

## 2020-08-04 DIAGNOSIS — R2689 Other abnormalities of gait and mobility: Secondary | ICD-10-CM | POA: Diagnosis not present

## 2020-08-04 DIAGNOSIS — R2681 Unsteadiness on feet: Secondary | ICD-10-CM | POA: Diagnosis not present

## 2020-08-04 DIAGNOSIS — M1A39X0 Chronic gout due to renal impairment, multiple sites, without tophus (tophi): Secondary | ICD-10-CM

## 2020-08-04 DIAGNOSIS — M6281 Muscle weakness (generalized): Secondary | ICD-10-CM

## 2020-08-04 DIAGNOSIS — G4733 Obstructive sleep apnea (adult) (pediatric): Secondary | ICD-10-CM | POA: Diagnosis not present

## 2020-08-04 MED ORDER — ALLOPURINOL 100 MG PO TABS: 100.0000 mg | ORAL_TABLET | Freq: Every day | ORAL | 0 refills | Status: DC

## 2020-08-04 NOTE — Therapy (Signed)
[facility] [address] [place], [place], [address] Phone: [phone]   Fax:  [phone]  Physical Therapy Treatment  Patient Details  Name: Lucas Caldwell MRN: [id number] Date of Birth: 06-25-1933 Referring Provider (PT): [name], DO   Encounter Date: 08/04/2020   PT End of Session - 08/04/20 1143    Visit Number 13    Number of Visits 25    Date for PT Re-Evaluation 08/29/20    Authorization Time Period eval performed on 06/06/2020    PT Start Time 1054    PT Stop Time 1137    PT Time Calculation (min) 43 min    Equipment Utilized During Treatment Gait belt    Activity Tolerance Patient tolerated treatment well;Patient limited by fatigue    Behavior During Therapy [facility] for tasks assessed/performed           Past Medical History:  Diagnosis Date  . Arthritis    "probably in his legs" (08/15/2016)  . Blind right eye   . BPH (benign prostatic hyperplasia)   . Breast asymmetry    Left breast is larger, present for several years.  [name] CAD (coronary artery disease)    a. Cath in the late 90's - reportedly ok;  b. 2014 s/p stenting x 2 @ [facility]; c 08/19/16 Cath/PCI with DES -> RCA, plan to treat LM 70% medically. Seen by surgery and felt to be too high risk for CABG; d. 08/2018 Cath: LM 70 (iFR 0.86), LAD 20ost, 40p, 50/33m D1 70, LCX patent stent, OM1 30, RCA patent stent, 460mSR, 40d, RPDA 30. PCWP 8. CO/CI 4.0/2.1; e. 03/2019 PCI to LAD (2.5x15 Resolute Onyx DES).  . Chronic combined systolic (congestive) and diastolic (congestive) heart failure ([place]   a. Previously reduced EF-->50% by echo in 2012;  b. 06/2015 Echo: EF 50-55%  c. 07/2016 Echo: EF 45-50%; d. 12/2017 Echo: EF 55%, Gr1 DD, mild MR; e. 08/2018 Echo: EF 35-40%, mildly dil PA; f. 04/2019 Echo: EF 30-35%, glob HK, nl RV fxn; g. s/p Cardiomems.  . Chronic kidney disease (CKD), stage IV (severe) (H[place]  . CML (chronic myelocytic leukemia) (HCG[place]  .  GERD (gastroesophageal reflux disease)   . GIB (gastrointestinal bleeding)    a. 12/2017 3 unit PRBC GIB in setting of coumadin-->Endo/colon multiple duodenal ulcers and a single bleeding ulcer in the proximal ascending colon status post hemostatic clipping x2.  . Gout   . Hyperlipemia   . Hypertension   . Hypothyroidism   . Iron deficiency anemia   . Ischemic cardiomyopathy    a. Previously reduced EF-->50% by echo in 2012;  b. 06/2015 Echo: EF 50-55%, Gr2 DD, mild MR, mildly dil LA, Ao sclerosis, mild TR;  c. 07/2016 Echo: EF 45-50%; d. 12/2017 Echo: EF 55%, Gr1 DD, mild MR; e. 08/2018 Echo: EF 35-40%; f. 04/2019 Echo: EF 30-35%.  . Migraine    "in the 1960s" (08/15/2016)  . Obstructive sleep apnea   . PAF (paroxysmal atrial fibrillation) (HCC)    a. ? Dx 2014-->s/p DCCV;  b. CHA2DS2VASc = 6--> Coumadin.  . Prostate cancer (HCPl[place]  . RBBB   . Type II diabetes mellitus (HCP[place]    Past Surgical History:  Procedure Laterality Date  . CATARACT EXTRACTION W/ INTRAOCULAR LENS  IMPLANT, BILATERAL Bilateral   . COLONOSCOPY N/A 01/13/2018   Procedure: COLONOSCOPY;  Surgeon: VaLin LandsmanMD;  [facility];  Service: Gastroenterology;  Laterality: N/A;  . COLONOSCOPY WITH PROPOFOL N/A 01/01/2018   Procedure: COLONOSCOPY WITH PROPOFOL;  Surgeon: Lin Landsman, MD;  Location: [facility] ENDOSCOPY;  Service: Gastroenterology;  Laterality: N/A;  . CORONARY ANGIOPLASTY WITH STENT PLACEMENT  09/2012   2 stents  . CORONARY STENT INTERVENTION N/A 08/19/2016   Procedure: Coronary Stent Intervention;  Surgeon: [name], MD;  Location: [place] CV LAB;  Service: Cardiovascular;  Laterality: N/A;  . CORONARY STENT INTERVENTION N/A 04/13/2019   Procedure: CORONARY STENT INTERVENTION;  Surgeon: [name], MD;  Location: [place] CV LAB;  Service: Cardiovascular;  Laterality: N/A;  . ESOPHAGEAL MANOMETRY N/A 12/16/2017   Procedure: ESOPHAGEAL MANOMETRY (EM);  Surgeon: Lin Landsman, MD;  Location: [facility] ENDOSCOPY;  Service: Gastroenterology;  Laterality: N/A;  . ESOPHAGOGASTRODUODENOSCOPY N/A 12/20/2016   Procedure: ESOPHAGOGASTRODUODENOSCOPY (EGD);  Surgeon: Lin Landsman, MD;  Location: [facility] ENDOSCOPY;  Service: Gastroenterology;  Laterality: N/A;  . ESOPHAGOGASTRODUODENOSCOPY N/A 01/13/2018   Procedure: ESOPHAGOGASTRODUODENOSCOPY (EGD);  Surgeon: Lin Landsman, MD;  Location: [facility] ENDOSCOPY;  Service: Gastroenterology;  Laterality: N/A;  . ESOPHAGOGASTRODUODENOSCOPY (EGD) WITH PROPOFOL N/A 11/03/2017   Procedure: ESOPHAGOGASTRODUODENOSCOPY (EGD) WITH PROPOFOL;  Surgeon: Lin Landsman, MD;  Location: [facility] ENDOSCOPY;  Service: Gastroenterology;  Laterality: N/A;  . EYE SURGERY    . HERNIA REPAIR     "navel"  . LEFT HEART CATH AND CORONARY ANGIOGRAPHY N/A 08/14/2016   Procedure: Left Heart Cath and Coronary Angiography;  Surgeon: [name], MD;  Location: [place] CV LAB;  Service: Cardiovascular;  Laterality: N/A;  . PRESSURE SENSOR/CARDIOMEMS N/A 09/23/2019   Procedure: PRESSURE SENSOR/CARDIOMEMS;  Surgeon: Jolaine Artist, MD;  Location: [place] CV LAB;  Service: Cardiovascular;  Laterality: N/A;  . RIGHT HEART CATH N/A 09/23/2019   Procedure: RIGHT HEART CATH;  Surgeon: Jolaine Artist, MD;  Location: [place] CV LAB;  Service: Cardiovascular;  Laterality: N/A;  . RIGHT/LEFT HEART CATH AND CORONARY ANGIOGRAPHY N/A 09/01/2018   Procedure: RIGHT/LEFT HEART CATH AND CORONARY ANGIOGRAPHY;  Surgeon: [name], MD;  Location: [place] CV LAB;  Service: Cardiovascular;  Laterality: N/A;  . RIGHT/LEFT HEART CATH AND CORONARY ANGIOGRAPHY N/A 04/13/2019   Procedure: RIGHT/LEFT HEART CATH AND CORONARY ANGIOGRAPHY;  Surgeon: [name], MD;  Location: [place] CV LAB;  Service: Cardiovascular;  Laterality: N/A;  . TOE AMPUTATION Right    "big toe"    There were no vitals filed for this visit.   Subjective  Assessment - 08/04/20 1142    Subjective Pt reports continued dizziness and feelings of being "swimmy-headed." Pt reports no pain.    Patient is accompained by: Family member    Pertinent History Pt is an 87 y/o male presenting to physical therapy today with spouse with reports of difficulty with balance, dizziness, and walking. Pt states he started feeling "swimmy headed" approximately one year ago and that it is affecting his balance with walking and has been progressively worsening. Pt reports he feels he is moving when he's still. He denies spinning sensation. He reports he sometimes has headache but says "they are not that bad." Pt reports feeling SOB with walking and household tasks (his doctor is aware). Pt denies unintentional weight gain or loss. Pt reports 1 fall the last 6 months. Per spouse pt tripped at time of fall and do not provide further details. Pt with extensive PMH. 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    Limitations House hold activities;Lifting;Standing;Walking;Writing   Pt reports when he tries to write his hand shakes a lot   How long can you sit comfortably? not limited    How long can you stand comfortably? 5 minutes    How long can you walk comfortably? 10 minutes    Diagnostic tests DG  Chest 2 View  from 07/06/2019 per chart: "  IMPRESSION:  No acute abnormalities."    Patient Stated Goals Feel more steady with walking    Currently in Pain? No/denies             TREATMENT  Neuro Re-Ed: VORx1 horizontal head turns, plain background 3x30 sec VORx1 vertical head turns, plain background 2x30 sec Ambulation with vertical head turns, 1x6, 1x2. Rest break. Ambulation with horizontal head turns, 1x8. Rest break.   Therex-   Nustep (seat 9) for cardiovascular and LE muscular endurance -  Min assist for LE placement with mount. VC for technique of coordinating UE and LEs. Baseline HR: 73 bpm First minute and a half at level 0, pt reports LEs feel a little tired. VC for SPM to maintained in 60s-80s. Increased level to 1 for min; at 3 min mark pt HR 94-05 bpm Decreased to level 0 for last minute to cool down. HR at end of exercise 87 bpm. Pt reports feeling a little short-winded. Rates exercise as medium.   Education on progressively increasing time walked at home. Pt advised to walk in his hallway for 5 min at a time. Work up to not needing rest break. Pt verbalized understanding.  STS - 2x8 without UE assist. Pt reports fatigue. Rest break  Standing hip abduction at support surface 2x10 B LEs.  Seated hamstring stretch 2x30 sec BLEs    Education provided throughout session primarily in the form of VC/Demo to facilitate improved technique with muscle activation and movement at target joints. Pt exhibits good carryover within session after cuing.        PT Education - 08/04/20 1143    Education Details exercise technique, body mechanics with standing hip abduction    Person(s) Educated Patient    Methods Explanation;Demonstration;Verbal cues    Comprehension Verbalized understanding;Returned demonstration            PT Short Term Goals - 07/26/20 1550      PT SHORT TERM GOAL #1   Title Patient will be independent in home exercise program to improve  strength/mobility for better functional independence with ADLs.    Baseline 06/06/2020: HEP to be initiated next visit; 5/11: Pt HEP to be updated/advanced.    Time 6    Period Weeks    Status On-going    Target Date 09/06/20             PT Long Term Goals - 07/26/20 1435      PT LONG TERM GOAL #1   Title Patient will increase FOTO score to equal to or greater than 54 to demonstrate statistically significant improvement in mobility and quality of life.  Baseline 06/06/2020: 46 (risk-adjusted 45); 5/11 73%    Time 12    Period Weeks    Status Achieved      PT LONG TERM GOAL #2   Title Patient will increase 10 meter walk test to >1.41ms as to improve gait speed for better community ambulation and to reduce fall risk.    Baseline 06/06/2020: 0.59 m/s with SPC; 5/11: 0.83 m/s SPC    Time 12    Period Weeks    Status On-going    Target Date 08/29/20      PT LONG TERM GOAL #3   Title Patient will increase Berg Balance score by > 6 points to demonstrate decreased fall risk during functional activities.    Baseline 06/06/2020: 40/56;  5/11: 46/56    Time 12    Period Weeks    Status Partially Met    Target Date 08/29/20      PT LONG TERM GOAL #4   Title Patient (> 616years old) will complete five times sit to stand test in < 15 seconds indicating an increased LE strength and improved balance.    Baseline 06/06/2020: 18 sec performed with BUE assist off chair; 5/11: 12 seconds using BUEs    Time 12    Period Weeks    Status Achieved    Target Date 08/29/20      PT LONG TERM GOAL #5   Title Patient will reduce dizziness handicap inventory score by at least 18 points, for less dizziness with ADLs and increased safety with home and work tasks.    Baseline 06/06/2020: 48% (moderate handicap); 5/11: 36%    Time 12    Period Weeks    Status On-going    Target Date 08/29/20      PT LONG TERM GOAL #6   Title Patient will increase Functional Gait Assessment score to >20/30 as to  reduce fall risk and improve dynamic gait safety with community ambulation.    Baseline 06/13/2020: 15/30 5/11: deferred this session due to time    Time 11    Period Weeks    Status On-going    Target Date 08/29/20                 Plan - 08/04/20 1144    Clinical Impression Statement Initiated endurance training on nustep this session to address pt impaired endurance in order to decrease fall risk. Pt continues to report fatigue with exercises. Pt most dizzy and challenged with ambulation with vertical head turns>horizontal head turns, and he is also limited by fatigue with ambulation. PT instructed pt on building his endurance for ambulating at home with AD for 5 min at a time and to progress to 10 min over time when that feels easy. Pt verbalized understanding. Pt will benefit from further skilled PT to improve balance, endurance, LE strength and gait ability to increase QOL and decrease fall risk.    Personal Factors and Comorbidities Age;Comorbidity 3+;Fitness;Comorbidity 1;Comorbidity 2;Past/Current Experience;Time since onset of injury/illness/exacerbation    Comorbidities Pertinent PMH/comorbidities per chart: Exertional dyspnea, centrilobular emphysema, angina, CAD, acute on chronic HFrEF, controlled DMII with diabetic nephropathy, diabetic neuropathy, gout, CKD, essential HTN, paroxysmal atrial fibrillation, chronic anticoagulation, CKD, fatigue, iron deficiency anemia, pain of LE, OA of B knees, hx of partial ray amputation of R great toe, s/p cataract extraction status of eye, enthesopathy of ankle and tarsus, GERD, hyperlipidemia, hypothyroidism, nuclear sclerosis of L eye, psedophakia of R eye, ischemic cardiomyopathy, esophageal dysphagia, chronic heart  failure with preserved ejection fraction, PSVT, coronary stent patent, bifascicular block, trifascicular block, wound infection, urge incontinence of urine.    Examination-Activity Limitations Bathing;Bed Mobility;Bend;Caring for  Others;Carry;Continence;Dressing;Hygiene/Grooming;Stairs;Squat;Reach Overhead;Locomotion Level;Lift;Stand;Toileting;Transfers    Examination-Participation Restrictions Medication Management;Cleaning;Meal Prep;Community Activity;Driving;Laundry;Shop;Yard Work;Volunteer    Stability/Clinical Decision Making Evolving/Moderate complexity    Rehab Potential Good    PT Frequency 2x / week    PT Duration 12 weeks    PT Treatment/Interventions ADLs/Self Care Home Management;Biofeedback;Canalith Repostioning;Cryotherapy;Electrical Stimulation;Moist Heat;Traction;Ultrasound;DME Instruction;Gait training;Stair training;Functional mobility training;Therapeutic activities;Therapeutic exercise;Balance training;Neuromuscular re-education;Patient/family education;Orthotic Fit/Training;Manual techniques;Wheelchair mobility training;Passive range of motion;Dry needling;Energy conservation;Taping;Vestibular;Joint Manipulations;Spinal Manipulations;Aquatic Therapy    PT Next Visit Plan dynamic balance, endurance    PT Home Exercise Plan advance HEP next session    Consulted and Agree with Plan of Care Patient;Family member/caregiver    Family Member Consulted spouse           Patient will benefit from skilled therapeutic intervention in order to improve the following deficits and impairments:  Abnormal gait,Dizziness,Increased fascial restricitons,Impaired sensation,Improper body mechanics,Pain,Cardiopulmonary status limiting activity,Decreased coordination,Decreased mobility,Postural dysfunction,Decreased activity tolerance,Decreased endurance,Decreased range of motion,Hypomobility,Decreased strength,Decreased balance,Difficulty walking,Impaired flexibility  Visit Diagnosis: Unsteadiness on feet  Other abnormalities of gait and mobility  Muscle weakness (generalized)     Problem List Patient Active Problem List   Diagnosis Date Noted  . Obstructive sleep apnea 06/29/2020  . Urge incontinence of urine  12/22/2019  . Trifascicular block 10/20/2019  . Wound infection 10/08/2019  . Bifascicular block 05/13/2019  . Coronary stent patent 04/13/2019  . Chronic HFrEF (heart failure with reduced ejection fraction) ([place]) 03/19/2019  . PSVT (paroxysmal supraventricular tachycardia) ([place]) 03/19/2019  . 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 ([place]) 08/24/2018  . CKD (chronic kidney disease), stage IV ([place]) 06/04/2018  . Osteoarthritis of knees, bilateral 06/02/2018  . Hyperlipidemia associated with type 2 diabetes mellitus ([place]) 05/06/2018  . Pain of lower extremity 03/06/2018  . Duodenitis 01/30/2018  . Chronic heart failure with preserved ejection fraction (HFpEF) ([place]) 01/23/2018  . Hematochezia   . Melena   . Rectal bleeding 01/11/2018  . AVM (arteriovenous malformation) of colon without hemorrhage   . Schatzki's ring   . Esophageal dysphagia   . Diabetic retinopathy ([place]) 07/08/2017  . Ischemic cardiomyopathy 06/26/2017  . Cardiomyopathy ([place]) 06/26/2017  . Centrilobular emphysema ([place]) 06/13/2017  . Fatigue 12/18/2016  . Stable angina () 09/20/2016  . Stage 3a chronic kidney disease ([place]) 09/20/2016  . Paroxysmal atrial fibrillation ([place]) 08/28/2016  . Chronic anticoagulation 08/28/2016  . Essential hypertension 08/20/2016  . 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 heart with stable angina pectoris ([place]) 08/19/2016  . Accelerating angina ([place]) 08/15/2016  . Exertional dyspnea 08/12/2016  . 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[place]) 12/29/2015  . Gynecomastia, male 03/31/2014  . Hypertrophy of breast 03/31/2014  . Nuclear sclerosis of left eye 10/22/2013  . Enthesopathy of ankle and tarsus 04/10/2013  . Controlled type 2 diabetes mellitus with diabetic nephropathy ([place]) 04/09/2013  . Diabetic neuropathy associated with type 2 diabetes mellitus ([place]) 04/09/2013  . Cataract of left eye 11/25/2011  . Corneal opacity 09/30/2011  . Pseudophakia of right eye 09/30/2011  . GERD  (gastroesophageal reflux disease) 09/25/2011  . Gout 09/25/2011  . Hyperlipidemia LDL goal <70 09/25/2011  . Status post cataract extraction 04/11/2011  . Cataract extraction status of eye 04/11/2011  . Iron deficiency anemia 04/01/2011  . Benign localized hyperplasia of prostate without urinary obstruction and other lower urinary tract symptoms (LUTS) 12/10/2010  .  Enlarged prostate without lower urinary tract symptoms (luts) 12/10/2010  . Colon cancer screening 10/11/2010  . Hypothyroidism 10/11/2010  . Hyperlipidemia 09/20/2003  . Essential and other specified forms of tremor 01/05/2001  . Essential tremor 01/05/2001   Ricard Dillon PT, DPT 08/04/2020, 11:53 AM  [place] [facility] SERVICES [address] [place], [place], [address] Phone: [phone]   Fax:  [phone]  Name: Lucas Caldwell MRN: [id number] Date of Birth: 1933-04-16

## 2020-08-07 ENCOUNTER — Other Ambulatory Visit: Payer: Self-pay

## 2020-08-07 ENCOUNTER — Ambulatory Visit: Payer: Medicare HMO

## 2020-08-07 ENCOUNTER — Telehealth: Payer: Self-pay | Admitting: Primary Care

## 2020-08-07 DIAGNOSIS — R2689 Other abnormalities of gait and mobility: Secondary | ICD-10-CM | POA: Diagnosis not present

## 2020-08-07 DIAGNOSIS — M6281 Muscle weakness (generalized): Secondary | ICD-10-CM

## 2020-08-07 DIAGNOSIS — R2681 Unsteadiness on feet: Secondary | ICD-10-CM

## 2020-08-07 DIAGNOSIS — G4733 Obstructive sleep apnea (adult) (pediatric): Secondary | ICD-10-CM | POA: Diagnosis not present

## 2020-08-07 NOTE — Therapy (Signed)
[place] MAIN [facility] SERVICES [address] [place], [place], [address] Phone: [phone]   Fax:  [phone]  Physical Therapy Treatment  Patient Details  Name: Lucas Caldwell MRN: [id number] Date of Birth: 09/13/1933 Referring Provider (PT): [name], DO   Encounter Date: 08/07/2020   PT End of Session - 08/07/20 1357    Visit Number 14    Number of Visits 25    Date for PT Re-Evaluation 08/29/20    Authorization Time Period eval performed on 06/06/2020    PT Start Time 1348    PT Stop Time 1430    PT Time Calculation (min) 42 min    Equipment Utilized During Treatment Gait belt    Activity Tolerance Patient tolerated treatment well    Behavior During Therapy [facility] for tasks assessed/performed           Past Medical History:  Diagnosis Date  . Arthritis    "probably in his legs" (08/15/2016)  . Blind right eye   . BPH (benign prostatic hyperplasia)   . Breast asymmetry    Left breast is larger, present for several years.  [name] CAD (coronary artery disease)    a. Cath in the late 90's - reportedly ok;  b. 2014 s/p stenting x 2 @ [facility]; c 08/19/16 Cath/PCI with DES -> RCA, plan to treat LM 70% medically. Seen by surgery and felt to be too high risk for CABG; d. 08/2018 Cath: LM 70 (iFR 0.86), LAD 20ost, 40p, 50/5m D1 70, LCX patent stent, OM1 30, RCA patent stent, 473mSR, 40d, RPDA 30. PCWP 8. CO/CI 4.0/2.1; e. 03/2019 PCI to LAD (2.5x15 Resolute Onyx DES).  . Chronic combined systolic (congestive) and diastolic (congestive) heart failure (HC[place]   a. Previously reduced EF-->50% by echo in 2012;  b. 06/2015 Echo: EF 50-55%  c. 07/2016 Echo: EF 45-50%; d. 12/2017 Echo: EF 55%, Gr1 DD, mild MR; e. 08/2018 Echo: EF 35-40%, mildly dil PA; f. 04/2019 Echo: EF 30-35%, glob HK, nl RV fxn; g. s/p Cardiomems.  . Chronic kidney disease (CKD), stage IV (severe) (HCMoca  . CML (chronic myelocytic leukemia) (HCSmithland  . GERD (gastroesophageal  reflux disease)   . GIB (gastrointestinal bleeding)    a. 12/2017 3 unit PRBC GIB in setting of coumadin-->Endo/colon multiple duodenal ulcers and a single bleeding ulcer in the proximal ascending colon status post hemostatic clipping x2.  . Gout   . Hyperlipemia   . Hypertension   . Hypothyroidism   . Iron deficiency anemia   . Ischemic cardiomyopathy    a. Previously reduced EF-->50% by echo in 2012;  b. 06/2015 Echo: EF 50-55%, Gr2 DD, mild MR, mildly dil LA, Ao sclerosis, mild TR;  c. 07/2016 Echo: EF 45-50%; d. 12/2017 Echo: EF 55%, Gr1 DD, mild MR; e. 08/2018 Echo: EF 35-40%; f. 04/2019 Echo: EF 30-35%.  . Migraine    "in the 1960s" (08/15/2016)  . Obstructive sleep apnea   . PAF (paroxysmal atrial fibrillation) (HCC)    a. ? Dx 2014-->s/p DCCV;  b. CHA2DS2VASc = 6--> Coumadin.  . Prostate cancer (HCKillona  . RBBB   . Type II diabetes mellitus (HCPrudenville    Past Surgical History:  Procedure Laterality Date  . CATARACT EXTRACTION W/ INTRAOCULAR LENS  IMPLANT, BILATERAL Bilateral   . COLONOSCOPY N/A 01/13/2018   Procedure: COLONOSCOPY;  Surgeon: VaLin LandsmanMD;  [facility];  Service: Gastroenterology;  Laterality: N/A;  .  COLONOSCOPY WITH PROPOFOL N/A 01/01/2018   Procedure: COLONOSCOPY WITH PROPOFOL;  Surgeon: Lin Landsman, MD;  Location: [facility] END[facility]COPY;  Service: Gastroenterology;  Laterality: N/A;  . CORONARY ANGIOPLASTY WITH STENT PLACEMENT  09/2012   2 stents  . CORONARY STENT INTERVENTION N/A 08/19/2016   Procedure: Coronary Stent Intervention;  Surgeon: [name], MD;  Location: [place] CV LAB;  Service: Cardiovascular;  Laterality: N/A;  . CORONARY STENT INTERVENTION N/A 04/13/2019   Procedure: CORONARY STENT INTERVENTION;  Surgeon: [name], MD;  Location: [place] CV LAB;  Service: Cardiovascular;  Laterality: N/A;  . ESOPHAGEAL MANOMETRY N/A 12/16/2017   Procedure: ESOPHAGEAL MANOMETRY (EM);  Surgeon: Lin Landsman, MD;   Location: [facility] ENDOSCOPY;  Service: Gastroenterology;  Laterality: N/A;  . ESOPHAGOGASTRODUODENOSCOPY N/A 12/20/2016   Procedure: ESOPHAGOGASTRODUODENOSCOPY (EGD);  Surgeon: Lin Landsman, MD;  Location: [facility] ENDOSCOPY;  Service: Gastroenterology;  Laterality: N/A;  . ESOPHAGOGASTRODUODENOSCOPY N/A 01/13/2018   Procedure: ESOPHAGOGASTRODUODENOSCOPY (EGD);  Surgeon: Lin Landsman, MD;  Location: [facility] ENDOSCOPY;  Service: Gastroenterology;  Laterality: N/A;  . ESOPHAGOGASTRODUODENOSCOPY (EGD) WITH PROPOFOL N/A 11/03/2017   Procedure: ESOPHAGOGASTRODUODENOSCOPY (EGD) WITH PROPOFOL;  Surgeon: Lin Landsman, MD;  Location[facility] END[facility]COPY;  Service: Gastroenterology;  Laterality: N/A;  . EYE SURGERY    . HERNIA REPAIR     "navel"  . LEFT HEART CATH AND CORONARY ANGIOGRAPHY N/A 08/14/2016   Procedure: Left Heart Cath and Coronary Angiography;  Surgeon: [name], MD;  Location: [place] CV LAB;  Service: Cardiovascular;  Laterality: N/A;  . PRESSURE SENSOR/CARDIOMEMS N/A 09/23/2019   Procedure: PRESSURE SENSOR/CARDIOMEMS;  Surgeon: Jolaine Artist, MD;  Location: [place] CV LAB;  Service: Cardiovascular;  Laterality: N/A;  . RIGHT HEART CATH N/A 09/23/2019   Procedure: RIGHT HEART CATH;  Surgeon: Jolaine Artist, MD;  Location: [place] CV LAB;  Service: Cardiovascular;  Laterality: N/A;  . RIGHT/LEFT HEART CATH AND CORONARY ANGIOGRAPHY N/A 09/01/2018   Procedure: RIGHT/LEFT HEART CATH AND CORONARY ANGIOGRAPHY;  Surgeon: [name], MD;  Location: [place] CV LAB;  Service: Cardiovascular;  Laterality: N/A;  . RIGHT/LEFT HEART CATH AND CORONARY ANGIOGRAPHY N/A 04/13/2019   Procedure: RIGHT/LEFT HEART CATH AND CORONARY ANGIOGRAPHY;  Surgeon: [name], MD;  Location: [place] CV LAB;  Service: Cardiovascular;  Laterality: N/A;  . TOE AMPUTATION Right    "big toe"    There were no vitals filed for this visit.   Subjective Assessment -  08/07/20 1352    Subjective Pt denies dizziness today at baseline but occurs some with walking. No pain and no falls.    Patient is accompained by: Family member    Pertinent History Pt is an 87 y/o male presenting to physical therapy today with spouse with reports of difficulty with balance, dizziness, and walking. Pt states he started feeling "swimmy headed" approximately one year ago and that it is affecting his balance with walking and has been progressively worsening. Pt reports he feels he is moving when he's still. He denies spinning sensation. He reports he sometimes has headache but says "they are not that bad." Pt reports feeling SOB with walking and household tasks (his doctor is aware). Pt denies unintentional weight gain or loss. Pt reports 1 fall the last 6 months. Per spouse pt tripped at time of fall and do not provide further details. Pt with extensive PMH. 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    Limitations House hold activities;Lifting;Standing;Walking;Writing   Pt reports when he tries to write his hand shakes a lot   How long can you sit comfortably? not limited    How long can you stand comfortably? 5 minutes    How long can you walk comfortably? 10 minutes    Diagnostic tests DG Chest 2 View   from 07/06/2019 per chart: "  IMPRESSION:  No acute abnormalities."    Patient Stated Goals Feel more steady with walking    Currently in Pain? No/denies           Therex-    Amb 198' x1 then 240' x1 with no AD and seated rest b/t bouts. Reciprocal pattern. Pt begins to display lateral stepping strategy at end of both walking bouts indicative of LE fatigue. Used to increase LE endurance for household and community distances. Pt carries SPC in hand in case it is needed and he feels unsteady. CGA.  4 way hip exercise at supportive surface with no resistance: 2x15, seated rest b/t bouts. VC's and TC's along with PT demo for improved form/technique with exercises. Max TC's on hip and shoulder for upright posture for hip abduction.  Heel raises with BUE support on supportive surface: 2x15. VC's to reduce hips shifting anterior. Good carryover after cuing.    260' bout of ambulation to continue LE endurance with walking tasks post strength exercises to further challenge endurance to improve household and community walking tasks.     PT Education - 08/07/20 1357    Education Details gait and form/technique with exercise.    Person(s) Educated Patient    Methods Explanation;Demonstration;Verbal cues    Comprehension Verbalized understanding            PT Short Term Goals - 07/26/20 1550      PT SHORT TERM GOAL #1   Title Patient will be independent in home exercise program to improve strength/mobility for better functional independence with ADLs.    Baseline 06/06/2020: HEP to be initiated next visit; 5/11: Pt HEP to be updated/advanced.    Time 6    Period Weeks    Status On-going    Target Date 09/06/20             PT Long Term Goals - 07/26/20 1435      PT LONG TERM GOAL #1   Title Patient will increase FOTO score to equal to or greater than 54 to demonstrate statistically significant improvement in mobility and quality of life.    Baseline 06/06/2020: 46 (risk-adjusted 45);  5/11 73%    Time 12    Period Weeks    Status Achieved      PT LONG TERM GOAL #2   Title Patient will increase 10 meter walk test to >1.50ms as to improve gait speed for better community ambulation and to reduce fall risk.    Baseline 06/06/2020: 0.59 m/s with SPC; 5/11: 0.83 m/s SPC    Time 12    Period Weeks    Status On-going  Target Date 08/29/20      PT LONG TERM GOAL #3   Title Patient will increase Berg Balance score by > 6 points to demonstrate decreased fall risk during functional activities.    Baseline 06/06/2020: 40/56;  5/11: 46/56    Time 12    Period Weeks    Status Partially Met    Target Date 08/29/20      PT LONG TERM GOAL #4   Title Patient (> 13 years old) will complete five times sit to stand test in < 15 seconds indicating an increased LE strength and improved balance.    Baseline 06/06/2020: 18 sec performed with BUE assist off chair; 5/11: 12 seconds using BUEs    Time 12    Period Weeks    Status Achieved    Target Date 08/29/20      PT LONG TERM GOAL #5   Title Patient will reduce dizziness handicap inventory score by at least 18 points, for less dizziness with ADLs and increased safety with home and work tasks.    Baseline 06/06/2020: 48% (moderate handicap); 5/11: 36%    Time 12    Period Weeks    Status On-going    Target Date 08/29/20      PT LONG TERM GOAL #6   Title Patient will increase Functional Gait Assessment score to >20/30 as to reduce fall risk and improve dynamic gait safety with community ambulation.    Baseline 06/13/2020: 15/30 5/11: deferred this session due to time    Time 11    Period Weeks    Status On-going    Target Date 08/29/20                 Plan - 08/07/20 1412    Clinical Impression Statement PT following pt POC on improving walking endurance and LE strengthening. Pt displayed ability to amb 240' with no AD with lateral steppag strategy to regain LOB towards end of wlaking bout which pt reports due to LE  fatigue but displayed good reciprocal pattern. Noted reverse [place] with gait indicative of glut med weakness and limited hip extension at terminal stance. Addressed impairments with 4 way hip exercises to improve gait. Pt will continue to benefit from skilled PT services to address strength and gait impairments so pt can return to PLOF.    Personal Factors and Comorbidities Age;Comorbidity 3+;Fitness;Comorbidity 1;Comorbidity 2;Past/Current Experience;Time since onset of injury/illness/exacerbation    Comorbidities Pertinent PMH/comorbidities per chart: Exertional dyspnea, centrilobular emphysema, angina, CAD, acute on chronic HFrEF, controlled DMII with diabetic nephropathy, diabetic neuropathy, gout, CKD, essential HTN, paroxysmal atrial fibrillation, chronic anticoagulation, CKD, fatigue, iron deficiency anemia, pain of LE, OA of B knees, hx of partial ray amputation of R great toe, s/p cataract extraction status of eye, enthesopathy of ankle and tarsus, GERD, hyperlipidemia, hypothyroidism, nuclear sclerosis of L eye, psedophakia of R eye, ischemic cardiomyopathy, esophageal dysphagia, chronic heart failure with preserved ejection fraction, PSVT, coronary stent patent, bifascicular block, trifascicular block, wound infection, urge incontinence of urine.    Examination-Activity Limitations Bathing;Bed Mobility;Bend;Caring for Others;Carry;Continence;Dressing;Hygiene/Grooming;Stairs;Squat;Reach Overhead;Locomotion Level;Lift;Stand;Toileting;Transfers    Examination-Participation Restrictions Medication Management;Cleaning;Meal Prep;Community Activity;Driving;Laundry;Shop;Yard Work;Volunteer    Stability/Clinical Decision Making Evolving/Moderate complexity    Rehab Potential Good    PT Frequency 2x / week    PT Duration 12 weeks    PT Treatment/Interventions ADLs/Self Care Home Management;Biofeedback;Canalith Repostioning;Cryotherapy;Electrical Stimulation;Moist Heat;Traction;Ultrasound;DME  Instruction;Gait training;Stair training;Functional mobility training;Therapeutic activities;Therapeutic exercise;Balance training;Neuromuscular re-education;Patient/family education;Orthotic Fit/Training;Manual techniques;Wheelchair mobility training;Passive range of motion;Dry needling;Energy conservation;Taping;Vestibular;Joint  Manipulations;Spinal Manipulations;Aquatic Therapy    PT Next Visit Plan dynamic balance, endurance    PT Home Exercise Plan advance HEP next session    Consulted and Agree with Plan of Care Patient;Family member/caregiver    Family Member Consulted spouse           Patient will benefit from skilled therapeutic intervention in order to improve the following deficits and impairments:  Abnormal gait,Dizziness,Increased fascial restricitons,Impaired sensation,Improper body mechanics,Pain,Cardiopulmonary status limiting activity,Decreased coordination,Decreased mobility,Postural dysfunction,Decreased activity tolerance,Decreased endurance,Decreased range of motion,Hypomobility,Decreased strength,Decreased balance,Difficulty walking,Impaired flexibility  Visit Diagnosis: Unsteadiness on feet  Other abnormalities of gait and mobility  Muscle weakness (generalized)     Problem List Patient Active Problem List   Diagnosis Date Noted  . Obstructive sleep apnea 06/29/2020  . Urge incontinence of urine 12/22/2019  . Trifascicular block 10/20/2019  . Wound infection 10/08/2019  . Bifascicular block 05/13/2019  . Coronary stent patent 04/13/2019  . Chronic HFrEF (heart failure with reduced ejection fraction) ([place]) 03/19/2019  . PSVT (paroxysmal supraventricular tachycardia) ([place]) 03/19/2019  . 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 ([place]) 08/24/2018  . CKD (chronic kidney disease), stage IV ([place]) 06/04/2018  . Osteoarthritis of knees, bilateral 06/02/2018  . Hyperlipidemia associated with type 2 diabetes mellitus ([place]) 05/06/2018  . Pain of lower  extremity 03/06/2018  . Duodenitis 01/30/2018  . Chronic heart failure with preserved ejection fraction (HFpEF) ([place]) 01/23/2018  . Hematochezia   . Melena   . Rectal bleeding 01/11/2018  . AVM (arteriovenous malformation) of colon without hemorrhage   . Schatzki's ring   . Esophageal dysphagia   . Diabetic retinopathy ([place]) 07/08/2017  . Ischemic cardiomyopathy 06/26/2017  . Cardiomyopathy ([place]) 06/26/2017  . Centrilobular emphysema ([place]) 06/13/2017  . Fatigue 12/18/2016  . Stable angina ([place]) 09/20/2016  . Stage 3a chronic kidney disease ([place]) 09/20/2016  . Paroxysmal atrial fibrillation ([place]) 08/28/2016  . Chronic anticoagulation 08/28/2016  . Essential hypertension 08/20/2016  . 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 heart with stable angina pectoris ([place]) 08/19/2016  . Accelerating angina ([place]) 08/15/2016  . Exertional dyspnea 08/12/2016  . 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[place]) 12/29/2015  . Gynecomastia, male 03/31/2014  . Hypertrophy of breast 03/31/2014  . Nuclear sclerosis of left eye 10/22/2013  . Enthesopathy of ankle and tarsus 04/10/2013  . Controlled type 2 diabetes mellitus with diabetic nephropathy ([place]) 04/09/2013  . Diabetic neuropathy associated with type 2 diabetes mellitus ([place]) 04/09/2013  . Cataract of left eye 11/25/2011  . Corneal opacity 09/30/2011  . Pseudophakia of right eye 09/30/2011  . GERD (gastroesophageal reflux disease) 09/25/2011  . Gout 09/25/2011  . Hyperlipidemia LDL goal <70 09/25/2011  . Status post cataract extraction 04/11/2011  . Cataract extraction status of eye 04/11/2011  . Iron deficiency anemia 04/01/2011  . Benign localized hyperplasia of prostate without urinary obstruction and other lower urinary tract symptoms (LUTS) 12/10/2010  . Enlarged prostate without lower urinary tract symptoms (luts) 12/10/2010  . Colon cancer screening 10/11/2010  . Hypothyroidism 10/11/2010  . Hyperlipidemia 09/20/2003  . Essential and other  specified forms of tremor 01/05/2001  . Essential tremor 01/05/2001    Salem Caster. Fairly IV, PT, DPT Physical Therapist- [facility]  08/07/2020, 3:41 PM  [place] MAIN [facility] SERVICES [address] [place], [place], [address] Phone: [phone]   Fax:  [phone]  Name: SHEP PORTER MRN: [id number] Date of Birth: Aug 17, 1933

## 2020-08-07 NOTE — Telephone Encounter (Signed)
Patient's spouse, [name](DPR) is requesting results of cpap titration.  Beth, please advise. Thanks

## 2020-08-07 NOTE — Telephone Encounter (Signed)
PSG showed moderate OSA with AHI 15.3/hr. He returned for titration study and needs CPAP pressure of 14cm h20 with Resmed F30i full dace mask size small wide. Can we place order.   Please let him know that he may need to wait several weeks until medical supply store can get CPAP machine. Needs follow-up in 3 months.

## 2020-08-07 NOTE — Telephone Encounter (Signed)
Patient's spouse, [name](DPR) is aware of results and voiced her understanding.  Order has been placed for cpap.  44mo recall has been placed. Nothing further needed at this time.

## 2020-08-11 ENCOUNTER — Ambulatory Visit: Payer: Medicare HMO

## 2020-08-14 ENCOUNTER — Telehealth: Payer: Medicare HMO

## 2020-08-15 ENCOUNTER — Ambulatory Visit: Payer: Medicare HMO

## 2020-08-16 ENCOUNTER — Telehealth: Payer: Self-pay | Admitting: Pharmacist

## 2020-08-16 ENCOUNTER — Emergency Department: Payer: Medicare HMO

## 2020-08-16 ENCOUNTER — Emergency Department
Admission: EM | Admit: 2020-08-16 | Discharge: 2020-08-16 | Disposition: A | Discharge status: Home / Self Care | Payer: Medicare HMO | Attending: Emergency Medicine | Admitting: Emergency Medicine

## 2020-08-16 ENCOUNTER — Other Ambulatory Visit: Payer: Self-pay

## 2020-08-16 ENCOUNTER — Ambulatory Visit: Payer: Self-pay | Admitting: *Deleted

## 2020-08-16 ENCOUNTER — Telehealth: Payer: Medicare HMO

## 2020-08-16 DIAGNOSIS — I130 Hypertensive heart and chronic kidney disease with heart failure and stage 1 through stage 4 chronic kidney disease, or unspecified chronic kidney disease: Secondary | ICD-10-CM | POA: Insufficient documentation

## 2020-08-16 DIAGNOSIS — R079 Chest pain, unspecified: Secondary | ICD-10-CM | POA: Diagnosis not present

## 2020-08-16 DIAGNOSIS — I517 Cardiomegaly: Secondary | ICD-10-CM | POA: Diagnosis not present

## 2020-08-16 DIAGNOSIS — Z79899 Other long term (current) drug therapy: Secondary | ICD-10-CM | POA: Diagnosis not present

## 2020-08-16 DIAGNOSIS — Z794 Long term (current) use of insulin: Secondary | ICD-10-CM | POA: Insufficient documentation

## 2020-08-16 DIAGNOSIS — R0789 Other chest pain: Secondary | ICD-10-CM | POA: Diagnosis not present

## 2020-08-16 DIAGNOSIS — N184 Chronic kidney disease, stage 4 (severe): Secondary | ICD-10-CM | POA: Insufficient documentation

## 2020-08-16 DIAGNOSIS — E039 Hypothyroidism, unspecified: Secondary | ICD-10-CM | POA: Insufficient documentation

## 2020-08-16 DIAGNOSIS — Z20822 Contact with and (suspected) exposure to covid-19: Secondary | ICD-10-CM | POA: Insufficient documentation

## 2020-08-16 DIAGNOSIS — I5042 Chronic combined systolic (congestive) and diastolic (congestive) heart failure: Secondary | ICD-10-CM | POA: Diagnosis not present

## 2020-08-16 DIAGNOSIS — Z955 Presence of coronary angioplasty implant and graft: Secondary | ICD-10-CM | POA: Insufficient documentation

## 2020-08-16 DIAGNOSIS — Z8546 Personal history of malignant neoplasm of prostate: Secondary | ICD-10-CM | POA: Diagnosis not present

## 2020-08-16 DIAGNOSIS — Z7984 Long term (current) use of oral hypoglycemic drugs: Secondary | ICD-10-CM | POA: Insufficient documentation

## 2020-08-16 DIAGNOSIS — Z7982 Long term (current) use of aspirin: Secondary | ICD-10-CM | POA: Diagnosis not present

## 2020-08-16 DIAGNOSIS — Z7901 Long term (current) use of anticoagulants: Secondary | ICD-10-CM | POA: Diagnosis not present

## 2020-08-16 DIAGNOSIS — J9811 Atelectasis: Secondary | ICD-10-CM | POA: Diagnosis not present

## 2020-08-16 DIAGNOSIS — J811 Chronic pulmonary edema: Secondary | ICD-10-CM | POA: Diagnosis not present

## 2020-08-16 DIAGNOSIS — Z87891 Personal history of nicotine dependence: Secondary | ICD-10-CM | POA: Insufficient documentation

## 2020-08-16 DIAGNOSIS — J810 Acute pulmonary edema: Secondary | ICD-10-CM | POA: Diagnosis not present

## 2020-08-16 DIAGNOSIS — E1122 Type 2 diabetes mellitus with diabetic chronic kidney disease: Secondary | ICD-10-CM | POA: Insufficient documentation

## 2020-08-16 DIAGNOSIS — I2510 Atherosclerotic heart disease of native coronary artery without angina pectoris: Secondary | ICD-10-CM | POA: Diagnosis not present

## 2020-08-16 DIAGNOSIS — J40 Bronchitis, not specified as acute or chronic: Secondary | ICD-10-CM | POA: Insufficient documentation

## 2020-08-16 DIAGNOSIS — I509 Heart failure, unspecified: Secondary | ICD-10-CM | POA: Diagnosis not present

## 2020-08-16 LAB — BRAIN NATRIURETIC PEPTIDE: B Natriuretic Peptide: 108.6 pg/mL — ABNORMAL HIGH (ref 0.0–100.0)

## 2020-08-16 LAB — RESP PANEL BY RT-PCR (FLU A&B, COVID) ARPGX2
Influenza A by PCR: NEGATIVE
Influenza B by PCR: NEGATIVE
SARS Coronavirus 2 by RT PCR: NEGATIVE

## 2020-08-16 LAB — CBC
HCT: 37.3 % — ABNORMAL LOW (ref 39.0–52.0)
Hemoglobin: 12.3 g/dL — ABNORMAL LOW (ref 13.0–17.0)
MCH: 28.9 pg (ref 26.0–34.0)
MCHC: 33 g/dL (ref 30.0–36.0)
MCV: 87.6 fL (ref 80.0–100.0)
Platelets: 168 10*3/uL (ref 150–400)
RBC: 4.26 MIL/uL (ref 4.22–5.81)
RDW: 14.9 % (ref 11.5–15.5)
WBC: 13.6 10*3/uL — ABNORMAL HIGH (ref 4.0–10.5)
nRBC: 0 % (ref 0.0–0.2)

## 2020-08-16 LAB — HEPATIC FUNCTION PANEL
ALT: 13 U/L (ref 0–44)
AST: 21 U/L (ref 15–41)
Albumin: 3.8 g/dL (ref 3.5–5.0)
Alkaline Phosphatase: 53 U/L (ref 38–126)
Bilirubin, Direct: <0.1 mg/dL (ref 0.0–0.2)
Total Bilirubin: 0.8 mg/dL (ref 0.3–1.2)
Total Protein: 7.7 g/dL (ref 6.5–8.1)

## 2020-08-16 LAB — BASIC METABOLIC PANEL
Anion gap: 11 (ref 5–15)
BUN: 23 mg/dL (ref 8–23)
CO2: 24 mmol/L (ref 22–32)
Calcium: 9.1 mg/dL (ref 8.9–10.3)
Chloride: 100 mmol/L (ref 98–111)
Creatinine, Ser: 1.73 mg/dL — ABNORMAL HIGH (ref 0.61–1.24)
GFR, Estimated: 38 mL/min — ABNORMAL LOW (ref >60)
Glucose, Bld: 138 mg/dL — ABNORMAL HIGH (ref 70–99)
Potassium: 4.7 mmol/L (ref 3.5–5.1)
Sodium: 135 mmol/L (ref 135–145)

## 2020-08-16 LAB — TROPONIN I (HIGH SENSITIVITY)
Troponin I (High Sensitivity): 12 ng/L (ref <18)
Troponin I (High Sensitivity): 13 ng/L (ref <18)
Troponin I (High Sensitivity): 12 ng/L (ref <18)

## 2020-08-16 MED ORDER — DOXYCYCLINE HYCLATE 100 MG PO CAPS: 100.0000 mg | ORAL_CAPSULE | Freq: Two times a day (BID) | ORAL | 0 refills | Status: DC

## 2020-08-16 MED ORDER — BENZONATATE 100 MG PO CAPS: 100.0000 mg | ORAL_CAPSULE | Freq: Three times a day (TID) | ORAL | 0 refills | Status: DC | PRN

## 2020-08-16 MED ORDER — FUROSEMIDE 10 MG/ML IJ SOLN
40.0000 mg | Freq: Once | INTRAMUSCULAR | Status: AC
  Administered 2020-08-16: 40 mg via INTRAVENOUS
  Filled 2020-08-16: qty 4

## 2020-08-16 NOTE — ED Provider Notes (Signed)
[facility] Emergency Department Provider Note  ____________________________________________   Event Date/Time   First MD Initiated Contact with Patient 08/16/20 1155     (approximate)  I have reviewed the triage vital signs and the nursing notes.   HISTORY  Chief Complaint Chest Pain    HPI Lucas Caldwell is a 87 y.o. male with past medical history as below here with mild chest pain.  The patient states that for the last several days, he has had a cough, nonproductive, with some mild shortness of breath with exertion.  He states that he thought he had COVID and took a home test which was negative.  He has had some mild nasal congestion.  He states that over the last day, after he coughs and when he moves her sits in certain positions, he has an aching anterior chest pain.  This then resolves.  He states that given his history he wanted to be evaluated for possible pneumonia.  He otherwise has been taking his meds as prescribed.  Denies any significant leg swelling.  No ongoing chest pain or chest pain at rest.  No hemoptysis.        Past Medical History:  Diagnosis Date  . Arthritis    "probably in his legs" (08/15/2016)  . Blind right eye   . BPH (benign prostatic hyperplasia)   . Breast asymmetry    Left breast is larger, present for several years.  [name] CAD (coronary artery disease)    a. Cath in the late 90's - reportedly ok;  b. 2014 s/p stenting x 2 @ [facility]; c 08/19/16 Cath/PCI with DES -> RCA, plan to treat LM 70% medically. Seen by surgery and felt to be too high risk for CABG; d. 08/2018 Cath: LM 70 (iFR 0.86), LAD 20ost, 40p, 50/107m, D1 70, LCX patent stent, OM1 30, RCA patent stent, 75m ISR, 40d, RPDA 30. PCWP 8. CO/CI 4.0/2.1; e. 03/2019 PCI to LAD (2.5x15 Resolute Onyx DES).  . Chronic combined systolic (congestive) and diastolic (congestive) heart failure ([place])    a. Previously reduced EF-->50% by echo in 2012;  b. 06/2015 Echo: EF 50-55%  c. 07/2016  Echo: EF 45-50%; d. 12/2017 Echo: EF 55%, Gr1 DD, mild MR; e. 08/2018 Echo: EF 35-40%, mildly dil PA; f. 04/2019 Echo: EF 30-35%, glob HK, nl RV fxn; g. s/p Cardiomems.  . Chronic kidney disease (CKD), stage IV (severe) ([place])   . CML (chronic myelocytic leukemia) ([employer])   . GERD (gastroesophageal reflux disease)   . GIB (gastrointestinal bleeding)    a. 12/2017 3 unit PRBC GIB in setting of coumadin-->Endo/colon multiple duodenal ulcers and a single bleeding ulcer in the proximal ascending colon status post hemostatic clipping x2.  . Gout   . Hyperlipemia   . Hypertension   . Hypothyroidism   . Iron deficiency anemia   . Ischemic cardiomyopathy    a. Previously reduced EF-->50% by echo in 2012;  b. 06/2015 Echo: EF 50-55%, Gr2 DD, mild MR, mildly dil LA, Ao sclerosis, mild TR;  c. 07/2016 Echo: EF 45-50%; d. 12/2017 Echo: EF 55%, Gr1 DD, mild MR; e. 08/2018 Echo: EF 35-40%; f. 04/2019 Echo: EF 30-35%.  . Migraine    "in the 1960s" (08/15/2016)  . Obstructive sleep apnea   . PAF (paroxysmal atrial fibrillation) (HCC)    a. ? Dx 2014-->s/p DCCV;  b. CHA2DS2VASc = 6--> Coumadin.  . Prostate cancer ([place])   . RBBB   . Type II diabetes mellitus (  [facility])     Patient Active Problem List   Diagnosis Date Noted  . Obstructive sleep apnea 06/29/2020  . Urge incontinence of urine 12/22/2019  . Trifascicular block 10/20/2019  . Wound infection 10/08/2019  . Bifascicular block 05/13/2019  . Coronary stent patent 04/13/2019  . Chronic HFrEF (heart failure with reduced ejection fraction) ([place]) 03/19/2019  . PSVT (paroxysmal supraventricular tachycardia) ([place]) 03/19/2019  . 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 CKD (chronic kidney disease), stage IV ([place]) 06/04/2018  . Osteoarthritis of knees, bilateral 06/02/2018  . Hyperlipidemia associated with type 2 diabetes mellitus ([place]) 05/06/2018  . Pain of lower extremity 03/06/2018  . Duodenitis 01/30/2018  . Chronic heart  failure with preserved ejection fraction (HFpEF) ([place]) 01/23/2018  . Hematochezia   . Melena   . Rectal bleeding 01/11/2018  . AVM (arteriovenous malformation) of colon without hemorrhage   . Schatzki's ring   . Esophageal dysphagia   . Diabetic retinopathy ([place]) 07/08/2017  . Ischemic cardiomyopathy 06/26/2017  . Cardiomyopathy ([place]) 06/26/2017  . Centrilobular emphysema ([place]) 06/13/2017  . Fatigue 12/18/2016  . Stable angina ([place]) 09/20/2016  . Stage 3a chronic kidney disease ([place]) 09/20/2016  . Paroxysmal atrial fibrillation ([place]) 08/28/2016  . Chronic anticoagulation 08/28/2016  . Essential hypertension 08/20/2016  . 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 heart with stable angina pectoris ([place]) 08/19/2016  . Accelerating angina ([place]) 08/15/2016  . Exertional dyspnea 08/12/2016  . 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[place]) 12/29/2015  . Gynecomastia, male 03/31/2014  . Hypertrophy of breast 03/31/2014  . Nuclear sclerosis of left eye 10/22/2013  . Enthesopathy of ankle and tarsus 04/10/2013  . Controlled type 2 diabetes mellitus with diabetic nephropathy ([place]) 04/09/2013  . Diabetic neuropathy associated with type 2 diabetes mellitus ([place]) 04/09/2013  . Cataract of left eye 11/25/2011  . Corneal opacity 09/30/2011  . Pseudophakia of right eye 09/30/2011  . GERD (gastroesophageal reflux disease) 09/25/2011  . Gout 09/25/2011  . Hyperlipidemia LDL goal <70 09/25/2011  . Status post cataract extraction 04/11/2011  . Cataract extraction status of eye 04/11/2011  . Iron deficiency anemia 04/01/2011  . Benign localized hyperplasia of prostate without urinary obstruction and other lower urinary tract symptoms (LUTS) 12/10/2010  . Enlarged prostate without lower urinary tract symptoms (luts) 12/10/2010  . Colon cancer screening 10/11/2010  . Hypothyroidism 10/11/2010  . Hyperlipidemia 09/20/2003  . Essential and other specified forms of tremor 01/05/2001  . Essential tremor 01/05/2001     Past Surgical History:  Procedure Laterality Date  . CATARACT EXTRACTION W/ INTRAOCULAR LENS  IMPLANT, BILATERAL Bilateral   . COLONOSCOPY N/A 01/13/2018   Procedure: COLONOSCOPY;  Surgeon: Lin Landsman, MD;  Location: [facility] ENDOSCOPY;  Service: Gastroenterology;  Laterality: N/A;  . COLONOSCOPY WITH PROPOFOL N/A 01/01/2018   Procedure: COLONOSCOPY WITH PROPOFOL;  Surgeon: Lin Landsman, MD;  Location: [facility] ENDOSCOPY;  Service: Gastroenterology;  Laterality: N/A;  . CORONARY ANGIOPLASTY WITH STENT PLACEMENT  09/2012   2 stents  . CORONARY STENT INTERVENTION N/A 08/19/2016   Procedure: Coronary Stent Intervention;  Surgeon: [name], MD;  Location: [place] CV LAB;  Service: Cardiovascular;  Laterality: N/A;  . CORONARY STENT INTERVENTION N/A 04/13/2019   Procedure: CORONARY STENT INTERVENTION;  Surgeon: [name], MD;  Location: [place] CV LAB;  Service: Cardiovascular;  Laterality: N/A;  . ESOPHAGEAL MANOMETRY N/A 12/16/2017   Procedure: ESOPHAGEAL MANOMETRY (EM);  Surgeon: Lin Landsman, MD;  Location: [facility] ENDOSCOPY;  Service:  Gastroenterology;  Laterality: N/A;  . ESOPHAGOGASTRODUODENOSCOPY N/A 12/20/2016   Procedure: ESOPHAGOGASTRODUODENOSCOPY (EGD);  Surgeon: Lin Landsman, MD;  Location: [facility] ENDOSCOPY;  Service: Gastroenterology;  Laterality: N/A;  . ESOPHAGOGASTRODUODENOSCOPY N/A 01/13/2018   Procedure: ESOPHAGOGASTRODUODENOSCOPY (EGD);  Surgeon: Lin Landsman, MD;  Location: [facility] ENDOSCOPY;  Service: Gastroenterology;  Laterality: N/A;  . ESOPHAGOGASTRODUODENOSCOPY (EGD) WITH PROPOFOL N/A 11/03/2017   Procedure: ESOPHAGOGASTRODUODENOSCOPY (EGD) WITH PROPOFOL;  Surgeon: Lin Landsman, MD;  Location: [facility] ENDOSCOPY;  Service: Gastroenterology;  Laterality: N/A;  . EYE SURGERY    . HERNIA REPAIR     "navel"  . LEFT HEART CATH AND CORONARY ANGIOGRAPHY N/A 08/14/2016   Procedure: Left Heart Cath and Coronary Angiography;   Surgeon: [name], MD;  Location: [place] CV LAB;  Service: Cardiovascular;  Laterality: N/A;  . PRESSURE SENSOR/CARDIOMEMS N/A 09/23/2019   Procedure: PRESSURE SENSOR/CARDIOMEMS;  Surgeon: Jolaine Artist, MD;  Location: [place] CV LAB;  Service: Cardiovascular;  Laterality: N/A;  . RIGHT HEART CATH N/A 09/23/2019   Procedure: RIGHT HEART CATH;  Surgeon: Jolaine Artist, MD;  Location: [place] CV LAB;  Service: Cardiovascular;  Laterality: N/A;  . RIGHT/LEFT HEART CATH AND CORONARY ANGIOGRAPHY N/A 09/01/2018   Procedure: RIGHT/LEFT HEART CATH AND CORONARY ANGIOGRAPHY;  Surgeon: [name], MD;  Location: [place] CV LAB;  Service: Cardiovascular;  Laterality: N/A;  . RIGHT/LEFT HEART CATH AND CORONARY ANGIOGRAPHY N/A 04/13/2019   Procedure: RIGHT/LEFT HEART CATH AND CORONARY ANGIOGRAPHY;  Surgeon: [name], MD;  Location: [place] CV LAB;  Service: Cardiovascular;  Laterality: N/A;  . TOE AMPUTATION Right    "big toe"    Prior to Admission medications   Medication Sig Start Date End Date Taking? Authorizing Provider  benzonatate (TESSALON PERLES) 100 MG capsule Take 1 capsule (100 mg total) by mouth 3 (three) times daily as needed for cough. 08/16/20 08/16/21 Yes [name], MD  doxycycline (VIBRAMYCIN) 100 MG capsule Take 1 capsule (100 mg total) by mouth 2 (two) times daily for 7 days. 08/16/20 08/23/20 Yes [name], MD  acetaminophen (TYLENOL) 500 MG tablet Take 1,000 mg by mouth every 6 (six) hours as needed for moderate pain or headache.     [provider]  Alcohol Swabs (B-D SINGLE USE SWABS REGULAR) PADS Use to check blood sugar up to 2 times daily 12/16/19   Parks Ranger, [name], DO  allopurinol (ZYLOPRIM) 100 MG tablet Take 1 tablet (100 mg total) by mouth daily. 08/04/20   Bensimhon, Shaune Pascal, MD  amoxicillin-clavulanate (AUGMENTIN) 875-125 MG tablet Take 1 tablet by mouth 2 (two) times daily. 07/28/20   Karamalegos,  [name], DO  aspirin 81 MG chewable tablet Chew 81 mg by mouth daily.    [provider]  Blood Glucose Monitoring Suppl (TRUE METRIX METER) DEVI Use to check blood sugar up to 2 times daily 05/04/20   Parks Ranger, [name], DO  bosutinib (BOSULIF) 100 MG tablet Take 100 mg by mouth at bedtime as needed (tired/not feeling well). Take with food.    [provider]  Cholecalciferol 25 MCG (1000 UT) capsule Take by mouth.    [provider]  EASY TOUCH PEN NEEDLES 31G X 8 MM MISC USE 1 NEEDLE DAILY WITH LEVEMIR 03/21/20   Karamalegos, [name], DO  empagliflozin (JARDIANCE) 10 MG TABS tablet Take 1 tablet (10 mg total) by mouth daily before breakfast. 02/24/20   Bensimhon, Shaune Pascal, MD  ferrous sulfate 325 (65 FE) MG tablet Take by  mouth.    [provider]  gabapentin (NEURONTIN) 300 MG capsule TAKE TWO CAPSULES BY MOUTH TWICE DAILY MAX 4 CAPSULE PER DAY - FOR KIDNEY PROTECTION 07/27/20   Parks Ranger, [name], DO  hydrALAZINE (APRESOLINE) 25 MG tablet Take 1 tablet (25 mg total) by mouth 3 (three) times daily. 10/01/19 02/02/20  Theora Gianotti, NP  Insulin Glargine [facility]) 100 UNIT/ML Inject 20 Units into the skin daily.    [provider]  Insulin Pen Needle 31G X 5 MM MISC 31 g by Does not apply route as directed. 04/16/17   [name], NP  isosorbide mononitrate (IMDUR) 60 MG 24 hr tablet Take 1 tablet (60 mg total) by mouth daily. 10/20/19   End, [name], MD  levothyroxine (SYNTHROID) 25 MCG tablet TAKE ONE TABLET BY MOUTH EVERY MORNING BEFORE BREAKFAST 07/05/20   Karamalegos, [name], DO  losartan (COZAAR) 25 MG tablet Take 1 tablet (25 mg total) by mouth daily. 04/18/20 07/17/20  Rise Mu, PA-C  Menthol-Zinc Oxide (GOLD BOND EX) Apply 1 application topically daily as needed (muscle pain).    [provider]  metoprolol succinate (TOPROL-XL) 25 MG 24 hr tablet TAKE ONE TABLET BY MOUTH ONCE DAILY  07/27/20   End, [name], MD  mirabegron ER (MYRBETRIQ) 25 MG TB24 tablet Take 1 tablet (25 mg total) by mouth daily. From Urology 02/08/20   Debroah Loop, PA-C  Multiple Vitamin (MULTIVITAMIN WITH MINERALS) TABS tablet Take 1 tablet by mouth daily.    [provider]  nitroGLYCERIN (NITROSTAT) 0.4 MG SL tablet Place 1 tablet (0.4 mg total) under the tongue every 5 (five) minutes x 3 doses as needed for chest pain. 10/01/19   Theora Gianotti, NP  omeprazole (PRILOSEC) 20 MG capsule Take 20 mg by mouth in the morning.     [provider]  Polyethyl Glycol-Propyl Glycol (SYSTANE OP) Place 1 drop into both eyes 3 (three) times daily as needed (dry/irritated eyes.).     [provider]  polyethylene glycol (MIRALAX / GLYCOLAX) 17 g packet Take 17 g by mouth daily as needed for mild constipation.     [provider]  potassium chloride (KLOR-CON) 10 MEQ tablet Take 1 tablet (10 mEq total) by mouth daily. 06/14/20   Karamalegos, [name], DO  ranolazine (RANEXA) 500 MG 12 hr tablet TAKE ONE TABLET BY MOUTH TWICE DAILY 05/25/20   End, [name], MD  rosuvastatin (CRESTOR) 5 MG tablet TAKE ONE TABLET BY MOUTH ONCE DAILY 08/03/20   End, [name], MD  simethicone (MYLICON) 80 MG chewable tablet Chew 80 mg by mouth every 6 (six) hours as needed for flatulence.    [provider]  tamsulosin (FLOMAX) 0.4 MG CAPS capsule Take 1 capsule (0.4 mg total) by mouth daily after supper. 11/24/19   Dunn, Areta Haber, PA-C  torsemide (DEMADEX) 20 MG tablet Take 3 tablets (60 mg total) by mouth daily. 05/19/20   Bensimhon, Shaune Pascal, MD  traMADol (ULTRAM) 50 MG tablet TAKE 1 OR 2 TABLETS BY MOUTH AT BEDTIME AS NEEDED 07/14/20   Karamalegos, [name], DO  TRULICITY 1.5 TD/3.2KG SOPN Inject 1.5 mg into the skin once a week. 01/10/20   Karamalegos, [name], DO  warfarin (COUMADIN) 3 MG tablet TAKE 1 TO 1 & 1/2 TABLETS BY MOUTH AS DIRECTED BY THE COUMADIN CLINIC  04/24/20   End, [name], MD    Allergies Clopidogrel, Ciprofloxacin, Mirtazapine, and Spironolactone  Family History  Problem Relation Age of Onset  .  Brain cancer Father   . Diabetes Sister   . Heart attack Sister     Social History Social History   Tobacco Use  . Smoking status: Former Smoker    Packs/day: 1.75    Years: 20.00    Pack years: 35.00    Types: Cigarettes    Start date: 1955    Quit date: 1975    Years since quitting: 47.4  . Smokeless tobacco: Former Network engineer  . Vaping Use: Never used  Substance Use Topics  . Alcohol use: No    Comment: previously drank heavily - quit 1979.  . Drug use: No    Review of Systems  Review of Systems  Constitutional: Positive for fatigue. Negative for chills and fever.  HENT: Negative for sore throat.   Respiratory: Positive for cough. Negative for shortness of breath.   Cardiovascular: Positive for chest pain.  Gastrointestinal: Negative for abdominal pain.  Genitourinary: Negative for flank pain.  Musculoskeletal: Negative for neck pain.  Skin: Negative for rash and wound.  Allergic/Immunologic: Negative for immunocompromised state.  Neurological: Negative for weakness and numbness.  Hematological: Does not bruise/bleed easily.  All other systems reviewed and are negative.    ____________________________________________  PHYSICAL EXAM:      VITAL SIGNS: ED Triage Vitals  Enc Vitals Group     BP 08/16/20 1157 132/75     Pulse Rate 08/16/20 1157 87     Resp 08/16/20 1157 (!) 23     Temp 08/16/20 1157 99.4 F (37.4 C)     Temp Source 08/16/20 1157 Oral     SpO2 08/16/20 1157 99 %     Weight 08/16/20 1202 198 lb 6.6 oz (90 kg)     Height --      Head Circumference --      Peak Flow --      Pain Score 08/16/20 1155 6     Pain Loc --      Pain Edu? --      Excl. in [place]? --      Physical Exam Vitals and nursing note reviewed.  Constitutional:      General: [name] not in acute distress.     Appearance: [name] well-developed.  HENT:     Head: Normocephalic and atraumatic.  Eyes:     Conjunctiva/sclera: Conjunctivae normal.  Cardiovascular:     Rate and Rhythm: Normal rate and regular rhythm.     Heart sounds: Normal heart sounds. No murmur heard. No friction rub.  Pulmonary:     Effort: Pulmonary effort is normal. No respiratory distress.     Breath sounds: Examination of the right-lower field reveals rales. Examination of the left-lower field reveals rales. Rales (Mild, bibasilar) present. No wheezing.  Abdominal:     General: There is no distension.     Palpations: Abdomen is soft.     Tenderness: There is no abdominal tenderness.  Musculoskeletal:     Cervical back: Neck supple.     Comments: Trace pitting edema bilateral lower extremities  Skin:    General: Skin is warm.     Capillary Refill: Capillary refill takes less than 2 seconds.  Neurological:     Mental Status: [name] alert and oriented to person, place, and time.     Motor: No abnormal muscle tone.       ____________________________________________   LABS (all labs ordered are listed, but only abnormal results are displayed)  Labs Reviewed  BASIC METABOLIC  PANEL - Abnormal; Notable for the following components:      Result Value   Glucose, Bld 138 (*)    Creatinine, Ser 1.73 (*)    GFR, Estimated 38 (*)    All other components within normal limits  CBC - Abnormal; Notable for the following components:   WBC 13.6 (*)    Hemoglobin 12.3 (*)    HCT 37.3 (*)    All other components within normal limits  BRAIN NATRIURETIC PEPTIDE - Abnormal; Notable for the following components:   B Natriuretic Peptide 108.6 (*)    All other components within normal limits  RESP PANEL BY RT-PCR (FLU A&B, COVID) ARPGX2  HEPATIC FUNCTION PANEL  TROPONIN I (HIGH SENSITIVITY)  TROPONIN I (HIGH SENSITIVITY)  TROPONIN I (HIGH SENSITIVITY)    ____________________________________________  EKG: Normal sinus rhythm,  ventricular rate 87.  PR 2 8, QRS 183, QTc 499.  No acute ischemic changes. ________________________________________  RADIOLOGY All imaging, including plain films, CT scans, and ultrasounds, independently reviewed by me, and interpretations confirmed via formal radiology reads.  ED MD interpretation:   Chest x-ray: Mild CHF  Official radiology report(s): DG Chest 2 View  Result Date: 08/16/2020 CLINICAL DATA:  Chest pain. EXAM: CHEST - 2 VIEW COMPARISON:  04/19/2019. FINDINGS: Mild cardiomegaly with mild pulmonary venous congestion. Mild bibasilar interstitial prominence. Mild CHF cannot be excluded. Low lung volumes with bibasilar atelectasis. No pleural effusion or pneumothorax. Metallic density noted over the left chest, question etiology. IMPRESSION: 1. Mild cardiomegaly and pulmonary venous congestion. Mild bilateral interstitial prominence. Findings consistent mild CHF. 2. Small metallic density noted over the left chest, question etiology. Electronically Signed   By: Marcello Moores  Register   On: 08/16/2020 12:21    ____________________________________________  PROCEDURES   Procedure(s) performed (including Critical Care):  Procedures  ____________________________________________  INITIAL IMPRESSION / MDM / [place] / ED COURSE  As part of my medical decision making, I reviewed the following data within the [place] notes reviewed and incorporated, Old chart reviewed, Notes from prior ED visits, and [place] Controlled Substance Database       *Lucas Caldwell was evaluated in Emergency Department on 08/16/2020 for the symptoms described in the history of present illness. He was evaluated in the context of the global COVID-19 pandemic, which necessitated consideration that the patient might be at risk for infection with the SARS-CoV-2 virus that causes COVID-19. Institutional protocols and algorithms that pertain to the evaluation of patients at risk  for COVID-19 are in a state of rapid change based on information released by regulatory bodies including the CDC and federal and state organizations. These policies and algorithms were followed during the patient's care in the ED.  Some ED evaluations and interventions may be delayed as a result of limited staffing during the pandemic.*     Medical Decision Making:  87 yo M here with mild chest pain, cough. Suspect chest wall pain 2/2 URI/bronchitis, versus CHF with chest wall pain from coughing. EKG is nonischemic, troponins negative x 2 despite constant sx - do not suspect ischemia or infarct. Pt is not hypoxic, is in NAD and has no clinical signs of PE. Lab work reviewed, remarkable for mild BNP elevation but otherwise is reassuring. CKD is at baseline. Mild leukocytosis is likely reactive. CXR obtained, reviewed, shows pulm edema. Pt given lasix, monitored in ED with significant improvement in sx and is ambulatory without difficulty or hypoxia.  He would like to attempt outpt  management which I think is reasonable. Will have him double his lasix for 2 days, as well as rx doxy for possible concomitant atypical PNA. Will f/u with PCP. Return precautions given.   ____________________________________________  FINAL CLINICAL IMPRESSION(S) / ED DIAGNOSES  Final diagnoses:  Bronchitis  Acute pulmonary edema ([place])     MEDICATIONS GIVEN DURING THIS VISIT:  Medications  furosemide (LASIX) injection 40 mg (40 mg Intravenous Given 08/16/20 1424)     ED Discharge Orders         Ordered    doxycycline (VIBRAMYCIN) 100 MG capsule  2 times daily        08/16/20 1600    benzonatate (TESSALON PERLES) 100 MG capsule  3 times daily PRN        08/16/20 1600           Note:  This document was prepared using Dragon voice recognition software and may include unintentional dictation errors.   [name], MD 08/16/20 [phone]

## 2020-08-16 NOTE — ED Notes (Signed)
Patient transported to X-ray 

## 2020-08-16 NOTE — ED Notes (Signed)
Unsuccessful IV attempt in left antecubital area, pressure dressing applied.

## 2020-08-16 NOTE — ED Notes (Signed)
Spoke with lab tech who states she will send phlebotomy to stick patient for labs. Peripheral IV started in right hand which flushes without resistance but will not pull back labs.

## 2020-08-16 NOTE — Discharge Instructions (Addendum)
For your breathing:  Start the antibiotic for possible bronchitis  INCREASE your dose of TORSEMIDE as follows: - For the next 2 days, take 60 mg TWICE a day instead of ONCE a day - Then, go back to your usual dose - Take potassium with each dose

## 2020-08-16 NOTE — ED Triage Notes (Addendum)
Pt come with c/o mid sternal CP and some epigastric belly pain. Pt states this started yesterday. Pt states no radiation. Pt states some SOB with exertion.

## 2020-08-16 NOTE — Telephone Encounter (Signed)
Cough, congestion, chest soreness with coughing, chest congestion for 3 days. Feeling very weak. Unsure of fever. Reported negative Covid taken yesterday after symptoms began Monday. No availability. Advised UC now.  Reason for Disposition . Patient sounds very sick or weak to the triager  Answer Assessment - Initial Assessment Questions 1. LOCATION: "Where does it hurt?"       Upper center part of chest 2. RADIATION: "Does the pain go anywhere else?" (e.g., into neck, jaw, arms, back)     no 3. ONSET: "When did the chest pain begin?" (Minutes, hours or days)      Monday 4. PATTERN "Does the pain come and go, or has it been constant since it started?"  "Does it get worse with exertion?"      Comes and goes 5. DURATION: "How long does it last" (e.g., seconds, minutes, hours)    Comes when he coughs 6. SEVERITY: "How bad is the pain?"  (e.g., Scale 1-10; mild, moderate, or severe)    - MILD (1-3): doesn't interfere with normal activities     - MODERATE (4-7): interferes with normal activities or awakens from sleep    - SEVERE (8-10): excruciating pain, unable to do any normal activities       10 7. CARDIAC RISK FACTORS: "Do you have any history of heart problems or risk factors for heart disease?" (e.g., angina, prior heart attack; diabetes, high blood pressure, high cholesterol, smoker, or strong family history of heart disease)     CHF 8. PULMONARY RISK FACTORS: "Do you have any history of lung disease?"  (e.g., blood clots in lung, asthma, emphysema, birth control pills)     no 9. CAUSE: "What do you think is causing the chest pain?"     unsure 10. OTHER SYMPTOMS: "Do you have any other symptoms?" (e.g., dizziness, nausea, vomiting, sweating, fever, difficulty breathing, cough)      Dizziness, cough, congestion 11. PREGNANCY: "Is there any chance you are pregnant?" "When was your last menstrual period?"       na  Protocols used: CHEST PAIN-A-AH

## 2020-08-16 NOTE — Telephone Encounter (Signed)
  Chronic Care Management   Outreach Note  08/16/2020 Name: Lucas Caldwell MRN: [id number] DOB: 14-Jan-1934  Referred by: [name], DO Reason for referral : No chief complaint on file.  Was unable to reach patient via telephone today and unable to leave a message as no voicemail picks up.  From review of chart, note patient's wife contacted office today regarding cough, congestion, chest soreness and was advised to take patient to Urgent Care.   Follow Up Plan: Will collaborate with Care Guide to outreach to reschedule follow up with me  [name], PharmD, [employer] [facility] [phone]

## 2020-08-18 ENCOUNTER — Ambulatory Visit: Payer: Medicare HMO

## 2020-08-21 ENCOUNTER — Telehealth: Payer: Medicare HMO

## 2020-08-22 ENCOUNTER — Ambulatory Visit (INDEPENDENT_AMBULATORY_CARE_PROVIDER_SITE_OTHER): Payer: Medicare HMO | Admitting: Family Medicine

## 2020-08-22 ENCOUNTER — Other Ambulatory Visit: Payer: Self-pay

## 2020-08-22 ENCOUNTER — Encounter: Payer: Self-pay | Admitting: Family Medicine

## 2020-08-22 VITALS — BP 129/62 | HR 78 | Ht 66.0 in | Wt 193.0 lb

## 2020-08-22 DIAGNOSIS — I5032 Chronic diastolic (congestive) heart failure: Secondary | ICD-10-CM

## 2020-08-22 DIAGNOSIS — J40 Bronchitis, not specified as acute or chronic: Secondary | ICD-10-CM

## 2020-08-22 DIAGNOSIS — N3946 Mixed incontinence: Secondary | ICD-10-CM

## 2020-08-22 DIAGNOSIS — I5031 Acute diastolic (congestive) heart failure: Secondary | ICD-10-CM | POA: Diagnosis not present

## 2020-08-22 MED ORDER — DOXYCYCLINE HYCLATE 100 MG PO TABS: 100.0000 mg | ORAL_TABLET | Freq: Two times a day (BID) | ORAL | 0 refills | Status: DC

## 2020-08-22 NOTE — Progress Notes (Signed)
Subjective:    Patient ID: Lucas Caldwell, male    DOB: 1933/09/26, 87 y.o.   MRN: [id number]  Lucas Caldwell is a 87 y.o. male presenting on 08/22/2020 for Hospitalization Follow-up  CC: Congestive Heart Failure  HPI  ED FOLLOW-UP VISIT  Hospital/Location: [facility] Date of ED Visit: 08/16/20  Reason for Presenting to ED: Cough Bronchitis Primary (+Secondary) Diagnosis: Mild CHF Exacerbation, Atypical Pneuomonia  FOLLOW-UP - ED provider note and record have been reviewed - Patient presents today about 6 days after recent ED visit. Brief summary of recent course, patient had symptoms of cough bronchitis and fluid swelling, presented to ED, testing in ED with CXR with mild CHF fluid see below, elevated BNP, and other labs, treated with Lasix diuretic IV in hospital. Then discharged with double dose Torsemide for 2 days . - Today reports overall has done fairly well after discharge from ED. Symptoms of bronchitis cough and fluid retention have improved but not yet resolved Weight down from 198 down to 193 Wife has similar cough COVID testing negative.  - New medications on discharge: Doxycycline - Changes to current meds on discharge: Torsemide 20mg  x 3 = 60mg  - normally DAILY they increased to BID dosing for 2 days.    I have reviewed the discharge medication list, and have reconciled the current and discharge medications today.   Current Outpatient Medications:  .  acetaminophen (TYLENOL) 500 MG tablet, Take 1,000 mg by mouth every 6 (six) hours as needed for moderate pain or headache. , Disp: , Rfl:  .  Alcohol Swabs (B-D SINGLE USE SWABS REGULAR) PADS, Use to check blood sugar up to 2 times daily, Disp: 200 each, Rfl: 3 .  allopurinol (ZYLOPRIM) 100 MG tablet, Take 1 tablet (100 mg total) by mouth daily., Disp: 90 tablet, Rfl: 0 .  aspirin 81 MG chewable tablet, Chew 81 mg by mouth daily., Disp: , Rfl:  .  benzonatate (TESSALON PERLES) 100 MG capsule, Take 1 capsule (100 mg  total) by mouth 3 (three) times daily as needed for cough., Disp: 30 capsule, Rfl: 0 .  Blood Glucose Monitoring Suppl (TRUE METRIX METER) DEVI, Use to check blood sugar up to 2 times daily, Disp: 1 each, Rfl: 0 .  bosutinib (BOSULIF) 100 MG tablet, Take 100 mg by mouth at bedtime as needed (tired/not feeling well). Take with food., Disp: , Rfl:  .  Cholecalciferol 25 MCG (1000 UT) capsule, Take by mouth., Disp: , Rfl:  .  doxycycline (VIBRAMYCIN) 100 MG capsule, Take 1 capsule (100 mg total) by mouth 2 (two) times daily for 7 days., Disp: 14 capsule, Rfl: 0 .  EASY TOUCH PEN NEEDLES 31G X 8 MM MISC, USE 1 NEEDLE DAILY WITH LEVEMIR, Disp: 100 each, Rfl: 2 .  empagliflozin (JARDIANCE) 10 MG TABS tablet, Take 1 tablet (10 mg total) by mouth daily before breakfast., Disp: 30 tablet, Rfl: 1 .  ferrous sulfate 325 (65 FE) MG tablet, Take by mouth., Disp: , Rfl:  .  gabapentin (NEURONTIN) 300 MG capsule, TAKE TWO CAPSULES BY MOUTH TWICE DAILY MAX 4 CAPSULE PER DAY - FOR KIDNEY PROTECTION, Disp: 120 capsule, Rfl: 5 .  Insulin Glargine ([name] KWIKPEN) 100 UNIT/ML, Inject 20 Units into the skin daily., Disp: , Rfl:  .  Insulin Pen Needle 31G X 5 MM MISC, 31 g by Does not apply route as directed., Disp: 100 each, Rfl: 5 .  isosorbide mononitrate (IMDUR) 60 MG 24 hr tablet, Take 1 tablet (60  mg total) by mouth daily., Disp: 135 tablet, Rfl: 2 .  levothyroxine (SYNTHROID) 25 MCG tablet, TAKE ONE TABLET BY MOUTH EVERY MORNING BEFORE BREAKFAST, Disp: 90 tablet, Rfl: 0 .  Menthol-Zinc Oxide (GOLD BOND EX), Apply 1 application topically daily as needed (muscle pain)., Disp: , Rfl:  .  metoprolol succinate (TOPROL-XL) 25 MG 24 hr tablet, TAKE ONE TABLET BY MOUTH ONCE DAILY, Disp: 30 tablet, Rfl: 2 .  mirabegron ER (MYRBETRIQ) 25 MG TB24 tablet, Take 1 tablet (25 mg total) by mouth daily. From Urology, Disp: 30 tablet, Rfl: 1 .  Multiple Vitamin (MULTIVITAMIN WITH MINERALS) TABS tablet, Take 1 tablet by mouth  daily., Disp: , Rfl:  .  nitroGLYCERIN (NITROSTAT) 0.4 MG SL tablet, Place 1 tablet (0.4 mg total) under the tongue every 5 (five) minutes x 3 doses as needed for chest pain., Disp: 25 tablet, Rfl: 4 .  omeprazole (PRILOSEC) 20 MG capsule, Take 20 mg by mouth in the morning. , Disp: , Rfl:  .  Polyethyl Glycol-Propyl Glycol (SYSTANE OP), Place 1 drop into both eyes 3 (three) times daily as needed (dry/irritated eyes.). , Disp: , Rfl:  .  polyethylene glycol (MIRALAX / GLYCOLAX) 17 g packet, Take 17 g by mouth daily as needed for mild constipation. , Disp: , Rfl:  .  potassium chloride (KLOR-CON) 10 MEQ tablet, Take 1 tablet (10 mEq total) by mouth daily., Disp: 90 tablet, Rfl: 1 .  ranolazine (RANEXA) 500 MG 12 hr tablet, TAKE ONE TABLET BY MOUTH TWICE DAILY, Disp: 180 tablet, Rfl: 2 .  rosuvastatin (CRESTOR) 5 MG tablet, TAKE ONE TABLET BY MOUTH ONCE DAILY, Disp: 90 tablet, Rfl: 0 .  simethicone (MYLICON) 80 MG chewable tablet, Chew 80 mg by mouth every 6 (six) hours as needed for flatulence., Disp: , Rfl:  .  tamsulosin (FLOMAX) 0.4 MG CAPS capsule, Take 1 capsule (0.4 mg total) by mouth daily after supper., Disp: 30 capsule, Rfl: 11 .  torsemide (DEMADEX) 20 MG tablet, Take 3 tablets (60 mg total) by mouth daily., Disp: 90 tablet, Rfl: 6 .  traMADol (ULTRAM) 50 MG tablet, TAKE 1 OR 2 TABLETS BY MOUTH AT BEDTIME AS NEEDED, Disp: 30 tablet, Rfl: 2 .  TRULICITY 1.5 OJ/5.0KX SOPN, Inject 1.5 mg into the skin once a week., Disp: 6 mL, Rfl: 3 .  warfarin (COUMADIN) 3 MG tablet, TAKE 1 TO 1 & 1/2 TABLETS BY MOUTH AS DIRECTED BY THE COUMADIN CLINIC, Disp: 120 tablet, Rfl: 0 .  hydrALAZINE (APRESOLINE) 25 MG tablet, Take 1 tablet (25 mg total) by mouth 3 (three) times daily., Disp: 270 tablet, Rfl: 3 .  losartan (COZAAR) 25 MG tablet, Take 1 tablet (25 mg total) by mouth daily., Disp: 90 tablet, Rfl: 3  ------------------------------------------------------------------------- Social History   Tobacco  Use  . Smoking status: Former Smoker    Packs/day: 1.75    Years: 20.00    Pack years: 35.00    Types: Cigarettes    Start date: 1955    Quit date: 1975    Years since quitting: 47.4  . Smokeless tobacco: Former Network engineer  . Vaping Use: Never used  Substance Use Topics  . Alcohol use: No    Comment: previously drank heavily - quit 1979.  . Drug use: No    Review of Systems Per HPI unless specifically indicated above     Objective:    BP 129/62   Pulse 78   Ht 5\' 6"  (1.676 m)  Wt 193 lb (87.5 kg)   SpO2 100%   BMI 31.15 kg/m   Wt Readings from Last 3 Encounters:  08/22/20 193 lb (87.5 kg)  08/16/20 198 lb 6.6 oz (90 kg)  07/28/20 197 lb (89.4 kg)    Physical Exam Vitals and nursing note reviewed.  Constitutional:      General: [name] not in acute distress.    Appearance: [name] well-developed. [name] not diaphoretic.     Comments: Well-appearing, comfortable, cooperative  HENT:     Head: Normocephalic and atraumatic.  Eyes:     General:        Right eye: No discharge.        Left eye: No discharge.     Conjunctiva/sclera: Conjunctivae normal.  Neck:     Thyroid: No thyromegaly.  Cardiovascular:     Rate and Rhythm: Normal rate and regular rhythm.     Heart sounds: Normal heart sounds. No murmur heard.   Pulmonary:     Effort: Pulmonary effort is normal. No respiratory distress.     Breath sounds: Normal breath sounds. No wheezing or rales.     Comments: Improved, some cough Musculoskeletal:        General: Normal range of motion.     Cervical back: Normal range of motion and neck supple.     Right lower leg: No edema.     Left lower leg: No edema.  Lymphadenopathy:     Cervical: No cervical adenopathy.  Skin:    General: Skin is warm and dry.     Findings: No erythema or rash.  Neurological:     Mental Status: [name] alert and oriented to person, place, and time.  Psychiatric:        Behavior: Behavior normal.     Comments: Well groomed,  good eye contact, normal speech and thoughts       Results for orders placed or performed during the hospital encounter of 08/16/20  Resp Panel by RT-PCR (Flu A&B, Covid) Nasopharyngeal Swab   Specimen: Nasopharyngeal Swab; Nasopharyngeal(NP) swabs in vial transport medium  Result Value Ref Range   SARS Coronavirus 2 by RT PCR NEGATIVE NEGATIVE   Influenza A by PCR NEGATIVE NEGATIVE   Influenza B by PCR NEGATIVE NEGATIVE  Basic metabolic panel  Result Value Ref Range   Sodium 135 135 - 145 mmol/L   Potassium 4.7 3.5 - 5.1 mmol/L   Chloride 100 98 - 111 mmol/L   CO2 24 22 - 32 mmol/L   Glucose, Bld 138 (H) 70 - 99 mg/dL   BUN 23 8 - 23 mg/dL   Creatinine, Ser 1.73 (H) 0.61 - 1.24 mg/dL   Calcium 9.1 8.9 - 10.3 mg/dL   GFR, Estimated 38 (L) >60 mL/min   Anion gap 11 5 - 15  CBC  Result Value Ref Range   WBC 13.6 (H) 4.0 - 10.5 K/uL   RBC 4.26 4.22 - 5.81 MIL/uL   Hemoglobin 12.3 (L) 13.0 - 17.0 g/dL   HCT 37.3 (L) 39.0 - 52.0 %   MCV 87.6 80.0 - 100.0 fL   MCH 28.9 26.0 - 34.0 pg   MCHC 33.0 30.0 - 36.0 g/dL   RDW 14.9 11.5 - 15.5 %   Platelets 168 150 - 400 K/uL   nRBC 0.0 0.0 - 0.2 %  Brain natriuretic peptide  Result Value Ref Range   B Natriuretic Peptide 108.6 (H) 0.0 - 100.0 pg/mL  Hepatic function panel  Result  Value Ref Range   Total Protein 7.7 6.5 - 8.1 g/dL   Albumin 3.8 3.5 - 5.0 g/dL   AST 21 15 - 41 U/L   ALT 13 0 - 44 U/L   Alkaline Phosphatase 53 38 - 126 U/L   Total Bilirubin 0.8 0.3 - 1.2 mg/dL   Bilirubin, Direct <0.1 0.0 - 0.2 mg/dL   Indirect Bilirubin NOT CALCULATED 0.3 - 0.9 mg/dL  Troponin I (High Sensitivity)  Result Value Ref Range   Troponin I (High Sensitivity) 13 <18 ng/L  Troponin I (High Sensitivity)  Result Value Ref Range   Troponin I (High Sensitivity) 12 <18 ng/L  Troponin I (High Sensitivity)  Result Value Ref Range   Troponin I (High Sensitivity) 12 <18 ng/L   *Note: Due to a large number of results and/or encounters for  the requested time period, some results have not been displayed. A complete set of results can be found in Results Review.      Assessment & Plan:   Problem List Items Addressed This Visit    Chronic heart failure with preserved ejection fraction (HFpEF) ([place])    Other Visit Diagnoses    Acute diastolic congestive heart failure (HCC)    -  Primary   Mixed stress and urge urinary incontinence       Bronchitis         Acute Diastolic CHF Volume up weight gain recently, also associated bronchitis vs atypical pneumonia Improved overall after diuresis IV in ED and adjusted home diuretic Torsemide x 2 days  Improved wt now back near baseline. Euvolemic no edema Improved clear lungs, occasional cough.  Complete Doxycycline 7 days, will agree to print 3 more days dosing if NEEDED ONLY if not improved, back up plan, printed rx only fill if not improved  Today will double dose Torsemide once more 60mg  BID then return to daily Return to Cardiology as scheduled  May finish Tessalon, Mucinex as well  For Mixed Stress / Urge Urinary incontinence Should return to [facility] last seen 01/2020 for same problem trial on Myrbetriq PVR Advised him to call and scheduled return visit in follow-up  No orders of the defined types were placed in this encounter.   Follow up plan: Return if symptoms worsen or fail to improve.   [name], [facility] 08/22/2020, 10:35 AM

## 2020-08-22 NOTE — Patient Instructions (Addendum)
Thank you for coming to the office today.  Last apt 01/2020, please call them back to re-schedule follow-up for Urinary Incontinence mixed.  Debroah Loop, PA-C  [facility] [address] [place], [place] [address] [phone]  --------  Take one more day of double dose Torsemide TWICE a day then go back to DAILY again  Apt with Cardiology coming up.  May finish Mucinex, Antibiotic, and Cough Perls.  Please schedule a Follow-up Appointment to: Return if symptoms worsen or fail to improve.  If you have any other questions or concerns, please feel free to call the office or send a message through [facility]. You may also schedule an earlier appointment if necessary.  Additionally, you may be receiving a survey about your experience at our office within a few days to 1 week by e-mail or mail. We value your feedback.  [name], DO [place]

## 2020-08-23 ENCOUNTER — Ambulatory Visit: Payer: Medicare HMO | Attending: Family Medicine

## 2020-08-23 ENCOUNTER — Ambulatory Visit (INDEPENDENT_AMBULATORY_CARE_PROVIDER_SITE_OTHER): Payer: Medicare HMO

## 2020-08-23 DIAGNOSIS — I480 Paroxysmal atrial fibrillation: Secondary | ICD-10-CM | POA: Diagnosis not present

## 2020-08-23 DIAGNOSIS — R2681 Unsteadiness on feet: Secondary | ICD-10-CM | POA: Insufficient documentation

## 2020-08-23 DIAGNOSIS — Z7901 Long term (current) use of anticoagulants: Secondary | ICD-10-CM

## 2020-08-23 DIAGNOSIS — Z5181 Encounter for therapeutic drug level monitoring: Secondary | ICD-10-CM

## 2020-08-23 DIAGNOSIS — M6281 Muscle weakness (generalized): Secondary | ICD-10-CM | POA: Insufficient documentation

## 2020-08-23 DIAGNOSIS — R2689 Other abnormalities of gait and mobility: Secondary | ICD-10-CM | POA: Insufficient documentation

## 2020-08-23 LAB — POCT INR: INR: 2.3 (ref 2.0–3.0)

## 2020-08-23 NOTE — Patient Instructions (Signed)
-   continue dosage of warfarin of 1.5 TABLETS EVERY DAY EXCEPT 1 TABLETS ON MONDAYS, [place]. - recheck INR in 6 weeks.

## 2020-08-24 DIAGNOSIS — L603 Nail dystrophy: Secondary | ICD-10-CM | POA: Diagnosis not present

## 2020-08-24 DIAGNOSIS — E1142 Type 2 diabetes mellitus with diabetic polyneuropathy: Secondary | ICD-10-CM | POA: Diagnosis not present

## 2020-08-24 DIAGNOSIS — E11621 Type 2 diabetes mellitus with foot ulcer: Secondary | ICD-10-CM | POA: Diagnosis not present

## 2020-08-24 DIAGNOSIS — M216X1 Other acquired deformities of right foot: Secondary | ICD-10-CM | POA: Diagnosis not present

## 2020-08-24 DIAGNOSIS — M2041 Other hammer toe(s) (acquired), right foot: Secondary | ICD-10-CM | POA: Diagnosis not present

## 2020-08-24 DIAGNOSIS — B351 Tinea unguium: Secondary | ICD-10-CM | POA: Diagnosis not present

## 2020-08-24 DIAGNOSIS — M792 Neuralgia and neuritis, unspecified: Secondary | ICD-10-CM | POA: Diagnosis not present

## 2020-08-24 DIAGNOSIS — E08621 Diabetes mellitus due to underlying condition with foot ulcer: Secondary | ICD-10-CM | POA: Diagnosis not present

## 2020-08-24 DIAGNOSIS — L97512 Non-pressure chronic ulcer of other part of right foot with fat layer exposed: Secondary | ICD-10-CM | POA: Diagnosis not present

## 2020-08-24 DIAGNOSIS — Z89411 Acquired absence of right great toe: Secondary | ICD-10-CM | POA: Diagnosis not present

## 2020-08-25 ENCOUNTER — Telehealth: Payer: Self-pay | Admitting: Pharmacist

## 2020-08-25 ENCOUNTER — Ambulatory Visit (INDEPENDENT_AMBULATORY_CARE_PROVIDER_SITE_OTHER): Payer: Medicare HMO | Admitting: Pharmacist

## 2020-08-25 DIAGNOSIS — E1121 Type 2 diabetes mellitus with diabetic nephropathy: Secondary | ICD-10-CM

## 2020-08-25 DIAGNOSIS — I5032 Chronic diastolic (congestive) heart failure: Secondary | ICD-10-CM | POA: Diagnosis not present

## 2020-08-25 DIAGNOSIS — J40 Bronchitis, not specified as acute or chronic: Secondary | ICD-10-CM | POA: Diagnosis not present

## 2020-08-25 DIAGNOSIS — Z794 Long term (current) use of insulin: Secondary | ICD-10-CM

## 2020-08-25 DIAGNOSIS — I10 Essential (primary) hypertension: Secondary | ICD-10-CM | POA: Diagnosis not present

## 2020-08-25 NOTE — Patient Instructions (Signed)
Visit Information  PATIENT GOALS:  Goals Addressed             This Visit's Progress    Pharmacy Goals       Please check your blood sugar, keep a log of the results and bring this with you to your medical appointments.  Please check your home blood pressure, keep a log of the results and bring this with you to your medical appointments.  Our goal bad cholesterol, or LDL, is less than 70 . This is why it is important to continue taking your rosuvastatin.  Feel free to call me with any questions or concerns. I look forward to our next call!    [name], PharmD, [place] [phone]         The patient verbalized understanding of instructions, educational materials, and care plan provided today and declined offer to receive copy of patient instructions, educational materials, and care plan.   Telephone follow up appointment with care management team member scheduled for: 7/11 at 11:15 am

## 2020-08-25 NOTE — Chronic Care Management (AMB) (Signed)
Chronic Care Management Pharmacy Note  08/25/2020 Name:  Lucas Caldwell MRN:  [id number] DOB:  11-19-1933   Subjective: Lucas Caldwell is an 87 y.o. year old male who is a primary patient of [name], DO.  The CCM team was consulted for assistance with disease management and care coordination needs.    Engaged with patient and wife by telephone for follow up visit in response to provider referral for pharmacy case management and/or care coordination services.   Consent to Services:  The patient was given information about Chronic Care Management services, agreed to services, and gave verbal consent prior to initiation of services.  Please see initial visit note for detailed documentation.   Patient Care Team: [name], DO as PCP - General (Family Medicine) End, [name], MD as PCP - Cardiology (Cardiology) Everson, [name], [place] as Pharmacist [name] [name], RN as Case Manager (General Practice)  Recent office visits: Office Visit with PCP on 6/7 for ED visit follow up  Recent consult visits: Office Visit with Podiatry on 6/9 Office Visit with Anti-coag clinic on 6/8  Hospital visits: Patient seen [facility] ED on 6/1 for bronchitis  Objective:  Lab Results  Component Value Date   CREATININE 1.73 (H) 08/16/2020   CREATININE 1.55 (H) 06/29/2020   CREATININE 1.72 (H) 04/13/2020    Lab Results  Component Value Date   HGBA1C 7.1 (A) 06/14/2020   Last diabetic Eye exam:  Lab Results  Component Value Date/Time   HMDIABEYEEXA Retinopathy (A) 12/07/2019 12:00 AM    Last diabetic Foot exam: No results found for: HMDIABFOOTEX   Social History   Tobacco Use  Smoking Status Former   Packs/day: 1.75   Years: 20.00   Pack years: 35.00   Types: Cigarettes   Start date: 1955   Quit date: 1975   Years since quitting: 47.4  Smokeless Tobacco Former   BP Readings from Last 3 Encounters:  08/22/20  129/62  08/16/20 102/67  07/28/20 (!) 114/57   Pulse Readings from Last 3 Encounters:  08/22/20 78  08/16/20 75  07/28/20 79   Wt Readings from Last 3 Encounters:  08/22/20 193 lb (87.5 kg)  08/16/20 198 lb 6.6 oz (90 kg)  07/28/20 197 lb (89.4 kg)    Assessment: Review of patient past medical history, allergies, medications, health status, including review of consultants reports, laboratory and other test data, was performed as part of comprehensive evaluation and provision of chronic care management services.   SDOH:  (Social Determinants of Health) assessments and interventions performed: none   CCM Care Plan  Allergies  Allergen Reactions   Clopidogrel Anaphylaxis and Other (See Comments)    altered mental status;  (electrolytes "out of wack" and confusion per family; THEY DO NOT REMEMBER ANY OTHER REACTION)- MSB 10/10/15   Ciprofloxacin Itching   Mirtazapine Diarrhea    Bad dreams    Spironolactone Other (See Comments)    gynecomastia    Medications Reviewed Today     Reviewed by [name], DO (Physician) on 08/22/20 at 1034  Med List Status: <None>   Medication Order Taking? Sig Documenting Provider Last Dose Status Informant  acetaminophen (TYLENOL) 500 MG tablet [id number] Yes Take 1,000 mg by mouth every 6 (six) hours as needed for moderate pain or headache.  [provider] Taking Active Spouse/Significant Other  Alcohol Swabs (B-D SINGLE USE SWABS REGULAR) PADS [id number] Yes Use to check blood sugar up to  2 times daily [name], DO Taking Active   allopurinol (ZYLOPRIM) 100 MG tablet [id number] Yes Take 1 tablet (100 mg total) by mouth daily. Bensimhon, Shaune Pascal, MD Taking Active   amoxicillin-clavulanate (AUGMENTIN) 875-125 MG tablet [id number] Yes Take 1 tablet by mouth 2 (two) times daily. [name], DO Taking Active   aspirin 81 MG chewable tablet [id number] Yes Chew 81 mg by mouth daily. [provider] Taking Active Spouse/Significant Other  benzonatate (TESSALON PERLES) 100 MG capsule [id number] Yes Take 1 capsule (100 mg total) by mouth 3 (three) times daily as needed for cough. [name], MD Taking Active   Blood Glucose Monitoring Suppl (TRUE Antonietta Breach) DEVI [id number] Yes Use to check blood sugar up to 2 times daily [name], DO Taking Active   bosutinib (BOSULIF) 100 MG tablet [id number] Yes Take 100 mg by mouth at bedtime as needed (tired/not feeling well). Take with food. [provider] Taking Active            Med Note Jeanie Cooks Sep 15, 2019  8:38 AM)    Cholecalciferol 25 MCG (1000 UT) capsule [id number] Yes Take by mouth. [provider] Taking Active   doxycycline (VIBRAMYCIN) 100 MG capsule [id number] Yes Take 1 capsule (100 mg total) by mouth 2 (two) times daily for 7 days. [name], MD Taking Active   EASY TOUCH PEN NEEDLES 31G X 8 MM MISC [id number] Yes USE 1 NEEDLE DAILY WITH LEVEMIR Karamalegos, [name], DO Taking Active   empagliflozin (JARDIANCE) 10 MG TABS tablet [id number] Yes Take 1 tablet (10 mg total) by mouth daily before breakfast. Bensimhon, Shaune Pascal, MD Taking Active   ferrous sulfate 325 (65 FE) MG tablet [id number] Yes Take by mouth. [provider] Taking Active   gabapentin (NEURONTIN) 300 MG capsule [id number] Yes TAKE TWO CAPSULES BY MOUTH TWICE DAILY MAX 4 CAPSULE PER DAY - FOR KIDNEY PROTECTION [name], DO Taking Active   hydrALAZINE (APRESOLINE) 25 MG tablet [id number]  Take 1 tablet (25 mg total) by mouth 3 (three) times daily. Theora Gianotti, NP  Expired 02/02/20 2359   Insulin Glargine ([name] KWIKPEN) 100 UNIT/ML [id number] Yes Inject 20 Units into the skin daily. [provider] Taking Active   Insulin Pen Needle 31G X 5 MM MISC [id number] Yes 31 g by Does not apply route as directed. [name], NP Taking Active  Spouse/Significant Other  isosorbide mononitrate (IMDUR) 60 MG 24 hr tablet [id number] Yes Take 1 tablet (60 mg total) by mouth daily. End, [name], MD Taking Active   levothyroxine (SYNTHROID) 25 MCG tablet [id number] Yes TAKE ONE TABLET BY MOUTH EVERY MORNING BEFORE BREAKFAST Karamalegos, [name], DO Taking Active   losartan (COZAAR) 25 MG tablet [id number]  Take 1 tablet (25 mg total) by mouth daily. Rise Mu, PA-C  Expired 07/17/20 2359   Menthol-Zinc Oxide (GOLD BOND EX) [id number] Yes Apply 1 application topically daily as needed (muscle pain). [provider] Taking Active Spouse/Significant Other  metoprolol succinate (TOPROL-XL) 25 MG 24 hr tablet [id number] Yes TAKE ONE TABLET BY MOUTH ONCE DAILY End, [name], MD Taking Active   mirabegron ER (MYRBETRIQ) 25 MG TB24 tablet [id number] Yes Take 1 tablet (25 mg total) by mouth daily. From Urology Debroah Loop, PA-C Taking Active   Multiple Vitamin (MULTIVITAMIN WITH MINERALS) TABS tablet [id number] Yes Take 1 tablet by mouth daily. [provider] Taking Active Spouse/Significant Other  nitroGLYCERIN (NITROSTAT)  0.4 MG SL tablet [id number] Yes Place 1 tablet (0.4 mg total) under the tongue every 5 (five) minutes x 3 doses as needed for chest pain. Theora Gianotti, NP Taking Active   omeprazole (PRILOSEC) 20 MG capsule [id number] Yes Take 20 mg by mouth in the morning.  [provider] Taking Active Spouse/Significant Other  Polyethyl Glycol-Propyl Glycol (SYSTANE OP) [id number] Yes Place 1 drop into both eyes 3 (three) times daily as needed (dry/irritated eyes.).  [provider] Taking Active Spouse/Significant Other  polyethylene glycol (MIRALAX / GLYCOLAX) 17 g packet [id number] Yes Take 17 g by mouth daily as needed for mild constipation.  [provider] Taking Active Spouse/Significant Other  potassium chloride (KLOR-CON) 10 MEQ tablet [id number] Yes Take 1 tablet (10 mEq  total) by mouth daily. [name], DO Taking Active   ranolazine (RANEXA) 500 MG 12 hr tablet [id number] Yes TAKE ONE TABLET BY MOUTH TWICE DAILY End, [name], MD Taking Active   rosuvastatin (CRESTOR) 5 MG tablet [id number] Yes TAKE ONE TABLET BY MOUTH ONCE DAILY End, [name], MD Taking Active   simethicone (MYLICON) 80 MG chewable tablet [id number] Yes Chew 80 mg by mouth every 6 (six) hours as needed for flatulence. [provider] Taking Active   tamsulosin (FLOMAX) 0.4 MG CAPS capsule [id number] Yes Take 1 capsule (0.4 mg total) by mouth daily after supper. Rise Mu, PA-C Taking Active   torsemide (DEMADEX) 20 MG tablet [id number] Yes Take 3 tablets (60 mg total) by mouth daily. Bensimhon, Shaune Pascal, MD Taking Active   traMADol (ULTRAM) 50 MG tablet [id number] Yes TAKE 1 OR 2 TABLETS BY MOUTH AT BEDTIME AS NEEDED [name], DO Taking Active   TRULICITY 1.5 ZM/6.2HU SOPN [id number] Yes Inject 1.5 mg into the skin once a week. [name], DO Taking Active            Med Note [facility], BRANDY L   Thu Jun 29, 2020  1:45 PM)    warfarin (COUMADIN) 3 MG tablet [id number] Yes TAKE 1 TO 1 & 1/2 TABLETS BY MOUTH AS DIRECTED BY THE COUMADIN CLINIC End, [name], MD Taking Active             Patient Active Problem List   Diagnosis Date Noted   Obstructive sleep apnea 06/29/2020   Urge incontinence of urine 12/22/2019   Trifascicular block 10/20/2019   Wound infection 10/08/2019   Bifascicular block 05/13/2019   Coronary stent patent 04/13/2019   Chronic HFrEF (heart failure with reduced ejection fraction) ([place]) 03/19/2019   PSVT (paroxysmal supraventricular tachycardia) ([place]) 03/19/2019   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 ([place]) 08/24/2018   CKD (chronic kidney disease), stage IV ([place]) 06/04/2018   Osteoarthritis of knees, bilateral 06/02/2018   Hyperlipidemia associated with type 2 diabetes mellitus ([place])  05/06/2018   Pain of lower extremity 03/06/2018   Duodenitis 01/30/2018   Chronic heart failure with preserved ejection fraction (HFpEF) ([place]) 01/23/2018   Hematochezia    Melena    Rectal bleeding 01/11/2018   AVM (arteriovenous malformation) of colon without hemorrhage    Schatzki's ring    Esophageal dysphagia    Diabetic retinopathy ([place]) 07/08/2017   Ischemic cardiomyopathy 06/26/2017   Cardiomyopathy ([place]) 06/26/2017   Centrilobular emphysema ([place]) 06/13/2017   Fatigue 12/18/2016   Stable angina ([place]) 09/20/2016   Stage 3a chronic kidney disease ([place]) 09/20/2016   Paroxysmal atrial fibrillation ([place]) 08/28/2016   Chronic anticoagulation 08/28/2016   Essential  hypertension 08/20/2016   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 heart with stable angina pectoris ([place]) 08/19/2016   Accelerating angina ([place]) 08/15/2016   Exertional dyspnea 08/12/2016   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   Gynecomastia, male 03/31/2014   Hypertrophy of breast 03/31/2014   Nuclear sclerosis of left eye 10/22/2013   Enthesopathy of ankle and tarsus 04/10/2013   Controlled type 2 diabetes mellitus with diabetic nephropathy ([place]) 04/09/2013   Diabetic neuropathy associated with type 2 diabetes mellitus ([place]) 04/09/2013   Cataract of left eye 11/25/2011   Corneal opacity 09/30/2011   Pseudophakia of right eye 09/30/2011   GERD (gastroesophageal reflux disease) 09/25/2011   Gout 09/25/2011   Hyperlipidemia LDL goal <70 09/25/2011   Status post cataract extraction 04/11/2011   Cataract extraction status of eye 04/11/2011   Iron deficiency anemia 04/01/2011   Benign localized hyperplasia of prostate without urinary obstruction and other lower urinary tract symptoms (LUTS) 12/10/2010   Enlarged prostate without lower urinary tract symptoms (luts) 12/10/2010   Colon cancer screening 10/11/2010   Hypothyroidism 10/11/2010   Hyperlipidemia 09/20/2003   Essential and other specified forms of  tremor 01/05/2001   Essential tremor 01/05/2001    Immunization History  Administered Date(s) Administered   Influenza, High Dose Seasonal PF 12/29/2014, 12/19/2016, 12/22/2017   Influenza, Seasonal, Injecte, Preservative Fre 01/13/2007, 12/13/2008, 01/30/2010, 12/26/2011   Influenza,inj,Quad PF,6+ Mos 12/25/2012, 12/09/2015, 11/26/2018, 12/15/2019   PFIZER(Purple Top)SARS-COV-2 Vaccination 05/13/2019, 06/08/2019, 12/15/2019, 05/19/2020   PPD Test 09/25/2010   Pneumococcal Conjugate-13 01/31/2014   Pneumococcal Polysaccharide-23 06/20/2008, 09/02/2010    Conditions to be addressed/monitored: T2DM, CKD, HF, HLD, atrial fibrillation, OSA, HTN, hypothyroidism  Care Plan : PharmD - Medication Management/Assistance  Updates made by Vella Raring, RPH-CPP since 08/25/2020 12:00 AM     Problem: Disease Progression      Long-Range Goal: Disease Progression Prevented or Minimized   Start Date: 04/14/2020  Expected End Date: 07/13/2020  This Visit's Progress: On track  Recent Progress: On track  Priority: High  Note:   Current Barriers:  Chronic Disease Management support, education, and care coordination needs related to type 2 diabetes, CKD, HFrEF s/p CardioMEMS, hyperlipidemia, atrial fibrillation, hypertension, coronary artery disease, CHF, hypothyroidism, and CML Financial Note patient has Partial Extra Help/LIS Subsidy Patient enrolled in [place] [employer] health practitioner and [employer]) patient assistance program for 2022 calendar year Patient denied for patient assistance for [place] from [employer] for 2022 calendar year Patient on waitlist for [place]  Pharmacist Clinical Goal(s):  Over the next 90 days, patient will verbalize ability to afford treatment regimen through collaboration with PharmD and provider.    Interventions: 1:1 collaboration with [name], DO regarding development and update of comprehensive plan of  care as evidenced by provider attestation and co-signature Inter-disciplinary care team collaboration (see longitudinal plan of care) Perform chart review Patient seen for Office Visit with Podiatry on 6/9 Office Visit with Anti-coag clinic on 6/8 Office Visit with PCP on 6/7 for ED visit follow up. Provider advised: Complete Doxycycline 7 days, will agree to print 3 more days dosing if NEEDED ONLY if not improved, back up plan, printed rx only fill if not improved Today (6/7) will double dose Torsemide once more 60mg  BID then return to daily Return to Cardiology as scheduled Call [place] to reschedule follow up Visit with Urology has been rescheduled for 6/15 Patient seen in [facility] ED on 6/1 for bronchitis. Patient advised  to: double his Lasix for 2 days,  Rx for doxycycline given  to f/u with PCP Today patient reports that he is feeling better. Confirms completed 7-day course of doxycycline and did not needed to use the back up Rx from PCP Confirms followed instruction from PCP from torsemide and currently back to taking torsemide 60 mg daily Reports patient adherent to scans required for CardioMEMS monitoring  Report patient has not attended [facility] PT recently due to illness, but planning to restart on 6/16   Type 2 Diabetes Controlled; current treatment: [name] 20 units daily Trulicity 1.5 mg once weekly  Jardiance 10 mg once daily in morning Patient reports recent AM CBGs readings ranging 105-165 Denies episodse of hypoglycemia  Medication Assistance Confirms patient currently has supply of Jardiance as picked up from [facility] (received from [facility]) Note patient was denied for [employer] patient assistance for Jardiance for 2022 calendar year based on copayment amount/household income Heart Failure Clinic providing patient with samples of Jardiance while patient on [place] waitlist  for heart failure fund.  Remind patient's wife to follow up with HF Clinic in [place] if needed for further samples; confirms she has phone number for clinic.  OSA: From review of pulmonology telephone note from 5/23, note patient completed sleep study, CPAP titration and order placed for CPAP, but patient advised that getting CPAP from medical supply can take several weeks.  Today patient's wife reports that she followed up with the medical supply company yesterday and was advised that may still take a couple of weeks to receive CPAP machine  Patient Goals/Self-Care Activities Over the next 90 days, patient will:  Check blood sugar, blood pressure and fluid status as directed by providers  Take medications, with assistance of wife, as directed Mrs. Brooke manages patient's medications using weekly pillbox as adherence tool Attends scheduled medical appointments  Follow Up Plan: Telephone follow up appointment with care management team member scheduled for: 7/11 at 11:15 am       Patient's preferred pharmacy is:  [facility], [place], [place] [address] Phone: [phone] Fax: [place], [place] [address] [place] [address] Phone: [phone] Fax: [facility] [place], [place] [address] Phone: [phone] Fax: [phone]  [facility] [place], [place] - [address] [place] [address] Phone: [phone] Fax: [phone]  Uses pill box? Yes   Follow Up:  Patient agrees to Care Plan and Follow-up.  Plan: Telephone follow up appointment with care management team member scheduled for:  7/11 at 11:15 am  [name], PharmD, [place], [place] [phone]

## 2020-08-25 NOTE — Telephone Encounter (Signed)
  Chronic Care Management   Outreach Note  08/25/2020 Name: Lucas Caldwell MRN: [id number] DOB: 1933/06/17  Referred by: [name], DO Reason for referral : No chief complaint on file.  This afternoon receive notification from [place] has opened for accepting applications from patients on the wait list.   Spoke with patient wife this morning, but unable to reach either this afternoon via telephone and have left HIPAA compliant voicemail asking patient to return my call.   Follow Up Plan: Will attempt to reach patient/wife by telephone on 6/13 regarding medication assistance fund  [name], PharmD, [place] [facility] [phone]

## 2020-08-28 ENCOUNTER — Ambulatory Visit: Payer: Medicare HMO | Admitting: Pharmacist

## 2020-08-28 DIAGNOSIS — I5032 Chronic diastolic (congestive) heart failure: Secondary | ICD-10-CM

## 2020-08-28 DIAGNOSIS — Z794 Long term (current) use of insulin: Secondary | ICD-10-CM

## 2020-08-28 NOTE — Chronic Care Management (AMB) (Signed)
Chronic Care Management Pharmacy Note  08/28/2020 Name:  Lucas Caldwell MRN:  [id number] DOB:  [birth year]  Subjective: Lucas Caldwell is an 87 y.o. year old male who is a primary patient of [name], DO.  The CCM team was consulted for assistance with disease management and care coordination needs.    Engaged with patient and wife by telephone for follow up visit in response to provider referral for pharmacy case management and/or care coordination services.   Consent to Services:  The patient was given information about Chronic Care Management services, agreed to services, and gave verbal consent prior to initiation of services.  Please see initial visit note for detailed documentation.   Patient Care Team: [name], DO as PCP - General (Family Medicine) End, [name], MD as PCP - Cardiology (Cardiology) Emali Heyward, [name], [place] as Pharmacist [name] [name], RN as Case Manager ([place])   Hospital visits: None in previous 6 months  Objective:  Lab Results  Component Value Date   CREATININE 1.73 (H) 08/16/2020   CREATININE 1.55 (H) 06/29/2020   CREATININE 1.72 (H) 04/13/2020    Lab Results  Component Value Date   HGBA1C 7.1 (A) 06/14/2020   Last diabetic Eye exam:  Lab Results  Component Value Date/Time   HMDIABEYEEXA Retinopathy (A) 12/07/2019 12:00 AM     Social History   Tobacco Use  Smoking Status Former   Packs/day: 1.75   Years: 20.00   Pack years: 35.00   Types: Cigarettes   Start date: 89   Quit date: 1975   Years since quitting: 47.4  Smokeless Tobacco Former   BP Readings from Last 3 Encounters:  08/22/20 129/62  08/16/20 102/67  07/28/20 (!) 114/57   Pulse Readings from Last 3 Encounters:  08/22/20 78  08/16/20 75  07/28/20 79   Wt Readings from Last 3 Encounters:  08/22/20 193 lb (87.5 kg)  08/16/20 198 lb 6.6 oz (90 kg)  07/28/20 197 lb (89.4 kg)    Assessment: Review of  patient past medical history, allergies, medications, health status, including review of consultants reports, laboratory and other test data, was performed as part of comprehensive evaluation and provision of chronic care management services.   SDOH:  (Social Determinants of Health) assessments and interventions performed: none   CCM Care Plan  Allergies  Allergen Reactions   Clopidogrel Anaphylaxis and Other (See Comments)    altered mental status;  (electrolytes "out of wack" and confusion per family; THEY DO NOT REMEMBER ANY OTHER REACTION)- MSB 10/10/15   Ciprofloxacin Itching   Mirtazapine Diarrhea    Bad dreams    Spironolactone Other (See Comments)    gynecomastia    Medications Reviewed Today     Reviewed by [name], DO (Physician) on 08/22/20 at 1034  Med List Status: <None>   Medication Order Taking? Sig Documenting Provider Last Dose Status Informant  acetaminophen (TYLENOL) 500 MG tablet [id number] Yes Take 1,000 mg by mouth every 6 (six) hours as needed for moderate pain or headache.  [provider] Taking Active Spouse/Significant Other  Alcohol Swabs (B-D SINGLE USE SWABS REGULAR) PADS [id number] Yes Use to check blood sugar up to 2 times daily [name], DO Taking Active   allopurinol (ZYLOPRIM) 100 MG tablet [id number] Yes Take 1 tablet (100 mg total) by mouth daily. Bensimhon, Shaune Pascal, MD Taking Active   amoxicillin-clavulanate (AUGMENTIN) 875-125 MG tablet [id number] Yes Take 1 tablet by mouth 2 (two)  times daily. [name], DO Taking Active   aspirin 81 MG chewable tablet [id number] Yes Chew 81 mg by mouth daily. [provider] Taking Active Spouse/Significant Other  benzonatate (TESSALON PERLES) 100 MG capsule [id number] Yes Take 1 capsule (100 mg total) by mouth 3 (three) times daily as needed for cough. [name], MD Taking Active   Blood Glucose Monitoring Suppl (TRUE Antonietta Breach) DEVI [id number]  Yes Use to check blood sugar up to 2 times daily [name], DO Taking Active   bosutinib (BOSULIF) 100 MG tablet [id number] Yes Take 100 mg by mouth at bedtime as needed (tired/not feeling well). Take with food. [provider] Taking Active            Med Note Jeanie Cooks Sep 15, 2019  8:38 AM)    Cholecalciferol 25 MCG (1000 UT) capsule [id number] Yes Take by mouth. [provider] Taking Active   doxycycline (VIBRAMYCIN) 100 MG capsule [id number] Yes Take 1 capsule (100 mg total) by mouth 2 (two) times daily for 7 days. [name], MD Taking Active   EASY TOUCH PEN NEEDLES 31G X 8 MM MISC [id number] Yes USE 1 NEEDLE DAILY WITH LEVEMIR Karamalegos, [name], DO Taking Active   empagliflozin (JARDIANCE) 10 MG TABS tablet [id number] Yes Take 1 tablet (10 mg total) by mouth daily before breakfast. Bensimhon, Shaune Pascal, MD Taking Active   ferrous sulfate 325 (65 FE) MG tablet [id number] Yes Take by mouth. [provider] Taking Active   gabapentin (NEURONTIN) 300 MG capsule [id number] Yes TAKE TWO CAPSULES BY MOUTH TWICE DAILY MAX 4 CAPSULE PER DAY - FOR KIDNEY PROTECTION [name], DO Taking Active   hydrALAZINE (APRESOLINE) 25 MG tablet [id number]  Take 1 tablet (25 mg total) by mouth 3 (three) times daily. Theora Gianotti, NP  Expired 02/02/20 2359   Insulin Glargine ([name] KWIKPEN) 100 UNIT/ML [id number] Yes Inject 20 Units into the skin daily. [provider] Taking Active   Insulin Pen Needle 31G X 5 MM MISC [id number] Yes 31 g by Does not apply route as directed. [name], NP Taking Active Spouse/Significant Other  isosorbide mononitrate (IMDUR) 60 MG 24 hr tablet [id number] Yes Take 1 tablet (60 mg total) by mouth daily. End, [name], MD Taking Active   levothyroxine (SYNTHROID) 25 MCG tablet [id number] Yes TAKE ONE TABLET BY MOUTH EVERY MORNING BEFORE BREAKFAST Karamalegos, [name], DO  Taking Active   losartan (COZAAR) 25 MG tablet [id number]  Take 1 tablet (25 mg total) by mouth daily. Rise Mu, PA-C  Expired 07/17/20 2359   Menthol-Zinc Oxide (GOLD BOND EX) [id number] Yes Apply 1 application topically daily as needed (muscle pain). [provider] Taking Active Spouse/Significant Other  metoprolol succinate (TOPROL-XL) 25 MG 24 hr tablet [id number] Yes TAKE ONE TABLET BY MOUTH ONCE DAILY End, [name], MD Taking Active   mirabegron ER (MYRBETRIQ) 25 MG TB24 tablet [id number] Yes Take 1 tablet (25 mg total) by mouth daily. From Urology Debroah Loop, PA-C Taking Active   Multiple Vitamin (MULTIVITAMIN WITH MINERALS) TABS tablet [id number] Yes Take 1 tablet by mouth daily. [provider] Taking Active Spouse/Significant Other  nitroGLYCERIN (NITROSTAT) 0.4 MG SL tablet [id number] Yes Place 1 tablet (0.4 mg total) under the tongue every 5 (five) minutes x 3 doses as needed for chest pain. Theora Gianotti, NP Taking Active   omeprazole (PRILOSEC) 20 MG capsule [id number] Yes Take 20 mg by mouth in the  morning.  [provider] Taking Active Spouse/Significant Other  Polyethyl Glycol-Propyl Glycol (SYSTANE OP) [id number] Yes Place 1 drop into both eyes 3 (three) times daily as needed (dry/irritated eyes.).  [provider] Taking Active Spouse/Significant Other  polyethylene glycol (MIRALAX / GLYCOLAX) 17 g packet [id number] Yes Take 17 g by mouth daily as needed for mild constipation.  [provider] Taking Active Spouse/Significant Other  potassium chloride (KLOR-CON) 10 MEQ tablet [id number] Yes Take 1 tablet (10 mEq total) by mouth daily. [name], DO Taking Active   ranolazine (RANEXA) 500 MG 12 hr tablet [id number] Yes TAKE ONE TABLET BY MOUTH TWICE DAILY End, [name], MD Taking Active   rosuvastatin (CRESTOR) 5 MG tablet [id number] Yes TAKE ONE TABLET BY MOUTH ONCE DAILY End, [name], MD Taking  Active   simethicone (MYLICON) 80 MG chewable tablet [id number] Yes Chew 80 mg by mouth every 6 (six) hours as needed for flatulence. [provider] Taking Active   tamsulosin (FLOMAX) 0.4 MG CAPS capsule [id number] Yes Take 1 capsule (0.4 mg total) by mouth daily after supper. Rise Mu, PA-C Taking Active   torsemide (DEMADEX) 20 MG tablet [id number] Yes Take 3 tablets (60 mg total) by mouth daily. Bensimhon, Shaune Pascal, MD Taking Active   traMADol (ULTRAM) 50 MG tablet [id number] Yes TAKE 1 OR 2 TABLETS BY MOUTH AT BEDTIME AS NEEDED [name], DO Taking Active   TRULICITY 1.5 JO/8.4ZY SOPN [id number] Yes Inject 1.5 mg into the skin once a week. [name], DO Taking Active            Med Note [facility], BRANDY L   Thu Jun 29, 2020  1:45 PM)    warfarin (COUMADIN) 3 MG tablet [id number] Yes TAKE 1 TO 1 & 1/2 TABLETS BY MOUTH AS DIRECTED BY THE COUMADIN CLINIC End, [name], MD Taking Active             Patient Active Problem List   Diagnosis Date Noted   Obstructive sleep apnea 06/29/2020   Urge incontinence of urine 12/22/2019   Trifascicular block 10/20/2019   Wound infection 10/08/2019   Bifascicular block 05/13/2019   Coronary stent patent 04/13/2019   Chronic HFrEF (heart failure with reduced ejection fraction) ([place]) 03/19/2019   PSVT (paroxysmal supraventricular tachycardia) ([place]) 03/19/2019   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 ([place]) 08/24/2018   CKD (chronic kidney disease), stage IV ([place]) 06/04/2018   Osteoarthritis of knees, bilateral 06/02/2018   Hyperlipidemia associated with type 2 diabetes mellitus ([place]) 05/06/2018   Pain of lower extremity 03/06/2018   Duodenitis 01/30/2018   Chronic heart failure with preserved ejection fraction (HFpEF) ([place]) 01/23/2018   Hematochezia    Melena    Rectal bleeding 01/11/2018   AVM (arteriovenous malformation) of colon without hemorrhage    Schatzki's ring     Esophageal dysphagia    Diabetic retinopathy ([place]) 07/08/2017   Ischemic cardiomyopathy 06/26/2017   Cardiomyopathy ([place]) 06/26/2017   Centrilobular emphysema ([name]) 06/13/2017   Fatigue 12/18/2016   Stable angina ([place]) 09/20/2016   Stage 3a chronic kidney disease ([place]) 09/20/2016   Paroxysmal atrial fibrillation ([place]) 08/28/2016   Chronic anticoagulation 08/28/2016   Essential hypertension 08/20/2016   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 heart with stable angina pectoris ([place]) 08/19/2016   Accelerating angina ([place]) 08/15/2016   Exertional dyspnea 08/12/2016   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   Gynecomastia, male 03/31/2014   Hypertrophy of breast 03/31/2014  Nuclear sclerosis of left eye 10/22/2013   Enthesopathy of ankle and tarsus 04/10/2013   Controlled type 2 diabetes mellitus with diabetic nephropathy ([place]) 04/09/2013   Diabetic neuropathy associated with type 2 diabetes mellitus ([place]) 04/09/2013   Cataract of left eye 11/25/2011   Corneal opacity 09/30/2011   Pseudophakia of right eye 09/30/2011   GERD (gastroesophageal reflux disease) 09/25/2011   Gout 09/25/2011   Hyperlipidemia LDL goal <70 09/25/2011   Status post cataract extraction 04/11/2011   Cataract extraction status of eye 04/11/2011   Iron deficiency anemia 04/01/2011   Benign localized hyperplasia of prostate without urinary obstruction and other lower urinary tract symptoms (LUTS) 12/10/2010   Enlarged prostate without lower urinary tract symptoms (luts) 12/10/2010   Colon cancer screening 10/11/2010   Hypothyroidism 10/11/2010   Hyperlipidemia 09/20/2003   Essential and other specified forms of tremor 01/05/2001   Essential tremor 01/05/2001    Immunization History  Administered Date(s) Administered   Influenza, High Dose Seasonal PF 12/29/2014, 12/19/2016, 12/22/2017   Influenza, Seasonal, Injecte, Preservative Fre 01/13/2007, 12/13/2008, 01/30/2010, 12/26/2011   Influenza,inj,Quad  PF,6+ Mos 12/25/2012, 12/09/2015, 11/26/2018, 12/15/2019   PFIZER(Purple Top)SARS-COV-2 Vaccination 05/13/2019, 06/08/2019, 12/15/2019, 05/19/2020   PPD Test 09/25/2010   Pneumococcal Conjugate-13 01/31/2014   Pneumococcal Polysaccharide-23 06/20/2008, 09/02/2010    Conditions to be addressed/monitored: T2DM, CKD, HF, HLD, atrial fibrillation, OSA, HTN, hypothyroidism  Care Plan : PharmD - Medication Management/Assistance  Updates made by Vella Raring, RPH-CPP since 08/28/2020 12:00 AM     Problem: Disease Progression      Long-Range Goal: Disease Progression Prevented or Minimized   Start Date: 04/14/2020  Expected End Date: 07/13/2020  This Visit's Progress: On track  Recent Progress: On track  Priority: High  Note:   Current Barriers:  Chronic Disease Management support, education, and care coordination needs related to type 2 diabetes, CKD, HFrEF s/p CardioMEMS, hyperlipidemia, atrial fibrillation, hypertension, coronary artery disease, CHF, hypothyroidism, and CML Financial Note patient has Partial Extra Help/LIS Subsidy Patient enrolled in [place] [employer] health practitioner and [place]) patient assistance program for 2022 calendar year Patient denied for patient assistance for [place] from [employer] for 2022 calendar year Patient on waitlist for [place]  Pharmacist Clinical Goal(s):  Over the next 90 days, patient will verbalize ability to afford treatment regimen through collaboration with PharmD and provider.    Interventions: 1:1 collaboration with [name], DO regarding development and update of comprehensive plan of care as evidenced by provider attestation and co-signature Inter-disciplinary care team collaboration (see longitudinal plan of care)  Medication Assistance Received notification from [facility] on 6/10 that the [place] has opened for accepting applications from patients  on the wait list (patient's wait list ID: [id number]) Today reach out to patient and wife to provide this update. Assist patient/wife with submitting application for [place] on [employer] today. Per website, patient will be notified within 4 business days of whether application was approved Patient currently has supply of Jardiance as picked up from [facility] (received from [facility]) Note patient was denied for [employer] patient assistance for Jardiance for 2022 calendar year based on copayment amount/household income [facility] providing patient with samples of Jardiance while patient on [place] waitlist for heart failure fund.    Patient Goals/Self-Care Activities Over the next 90 days, patient will:  Check blood sugar, blood pressure and fluid status as directed by providers  Take medications, with  assistance of wife, as directed Mrs. Demary manages patient's medications using weekly pillbox as adherence tool Attends scheduled medical appointments  Follow Up Plan: Telephone follow up appointment with care management team member scheduled for: 7/11 at 11:15 am       Patient's preferred pharmacy is:  [facility], [place], [address] [place] [address] Phone: [phone] Fax: [place], [place] [address] [place] [address] Phone: [phone] Fax: [facility] [place], [address] [address] Phone: [phone] Fax: [phone]  [facility] [place], [place] - [address] [place] [address] Phone: [phone] Fax: [phone]   Follow Up:  Patient agrees to Care Plan and Follow-up.  [name], PharmD, [place], CPP Clinical  Pharmacist [facility] [place] [phone]

## 2020-08-28 NOTE — Patient Instructions (Signed)
Visit Information  PATIENT GOALS:  Goals Addressed             This Visit's Progress    Pharmacy Goals       Please check your blood sugar, keep a log of the results and bring this with you to your medical appointments.  Please check your home blood pressure, keep a log of the results and bring this with you to your medical appointments.  Our goal bad cholesterol, or LDL, is less than 70 . This is why it is important to continue taking your rosuvastatin.  Feel free to call me with any questions or concerns. I look forward to our next call!   [name], PharmD, [place] [phone]         The patient verbalized understanding of instructions, educational materials, and care plan provided today and declined offer to receive copy of patient instructions, educational materials, and care plan.   Telephone follow up appointment with care management team member scheduled for: 7/11

## 2020-08-30 ENCOUNTER — Ambulatory Visit: Payer: Medicare HMO | Admitting: Pharmacist

## 2020-08-30 ENCOUNTER — Other Ambulatory Visit: Payer: Self-pay

## 2020-08-30 ENCOUNTER — Ambulatory Visit (INDEPENDENT_AMBULATORY_CARE_PROVIDER_SITE_OTHER): Payer: Medicare HMO | Admitting: Physician Assistant

## 2020-08-30 ENCOUNTER — Telehealth: Payer: Self-pay

## 2020-08-30 VITALS — BP 121/71 | HR 81 | Ht 64.0 in | Wt 194.0 lb

## 2020-08-30 DIAGNOSIS — N3941 Urge incontinence: Secondary | ICD-10-CM

## 2020-08-30 DIAGNOSIS — E1121 Type 2 diabetes mellitus with diabetic nephropathy: Secondary | ICD-10-CM

## 2020-08-30 LAB — BLADDER SCAN AMB NON-IMAGING: Scan Result: 5

## 2020-08-30 MED ORDER — WARFARIN SODIUM 3 MG PO TABS: ORAL_TABLET | ORAL | 0 refills | Status: DC

## 2020-08-30 MED ORDER — MIRABEGRON ER 25 MG PO TB24: 25.0000 mg | ORAL_TABLET | Freq: Every day | ORAL | 11 refills | Status: DC

## 2020-08-30 NOTE — Telephone Encounter (Signed)
*  STAT* If patient is at the pharmacy, call can be transferred to refill team.   1. Which medications need to be refilled? (please list name of each medication and dose if known) warfarin  2. Which pharmacy/location (including street and city if local pharmacy) is medication to be sent to? [place] [facility]  3. Do they need a 30 day or 90 day supply? 30  Patient is out of medication

## 2020-08-30 NOTE — Patient Instructions (Signed)
Visit Information  PATIENT GOALS:  Goals Addressed             This Visit's Progress    Pharmacy Goals       Please check your blood sugar, keep a log of the results and bring this with you to your medical appointments.  Please check your home blood pressure, keep a log of the results and bring this with you to your medical appointments.  Our goal bad cholesterol, or LDL, is less than 70 . This is why it is important to continue taking your rosuvastatin.  Feel free to call me with any questions or concerns. I look forward to our next call!  [name], PharmD, [place] [phone]         The patient verbalized understanding of instructions, educational materials, and care plan provided today and declined offer to receive copy of patient instructions, educational materials, and care plan.   Telephone follow up appointment with care management team member scheduled for: 7/11

## 2020-08-30 NOTE — Chronic Care Management (AMB) (Signed)
Chronic Care Management Pharmacy Note  08/30/2020 Name:  Lucas Caldwell MRN:  [id number] DOB:  1933-05-08   Subjective: Lucas Caldwell is an 87 y.o. year old male who is a primary patient of [name], DO.  The CCM team was consulted for assistance with disease management and care coordination needs.    Engaged with patient's wife by telephone for follow up visit in response to provider referral for pharmacy case management and/or care coordination services.   Consent to Services:  The patient was given information about Chronic Care Management services, agreed to services, and gave verbal consent prior to initiation of services.  Please see initial visit note for detailed documentation.   Patient Care Team: [name], DO as PCP - General (Family Medicine) End, [name], MD as PCP - Cardiology (Cardiology) Riven Mabile, [name], Brier as Pharmacist [name] [name], RN as Case Manager (General Practice)  Recent office visits: None  Hospital visits: Patient seen in [facility] ED on 6/1 for bronchitis  Objective:  Lab Results  Component Value Date   CREATININE 1.73 (H) 08/16/2020   CREATININE 1.55 (H) 06/29/2020   CREATININE 1.72 (H) 04/13/2020    Lab Results  Component Value Date   HGBA1C 7.1 (A) 06/14/2020   Last diabetic Eye exam:  Lab Results  Component Value Date/Time   HMDIABEYEEXA Retinopathy (A) 12/07/2019 12:00 AM    Last diabetic Foot exam: No results found for: HMDIABFOOTEX   Social History   Tobacco Use  Smoking Status Former   Packs/day: 1.75   Years: 20.00   Pack years: 35.00   Types: Cigarettes   Start date: 8   Quit date: 1975   Years since quitting: 47.4  Smokeless Tobacco Former   BP Readings from Last 3 Encounters:  08/22/20 129/62  08/16/20 102/67  07/28/20 (!) 114/57   Pulse Readings from Last 3 Encounters:  08/22/20 78  08/16/20 75  07/28/20 79   Wt Readings from  Last 3 Encounters:  08/22/20 193 lb (87.5 kg)  08/16/20 198 lb 6.6 oz (90 kg)  07/28/20 197 lb (89.4 kg)    Assessment: Review of patient past medical history, allergies, medications, health status, including review of consultants reports, laboratory and other test data, was performed as part of comprehensive evaluation and provision of chronic care management services.   SDOH:  (Social Determinants of Health) assessments and interventions performed: none   CCM Care Plan  Allergies  Allergen Reactions   Clopidogrel Anaphylaxis and Other (See Comments)    altered mental status;  (electrolytes "out of wack" and confusion per family; THEY DO NOT REMEMBER ANY OTHER REACTION)- MSB 10/10/15   Ciprofloxacin Itching   Mirtazapine Diarrhea    Bad dreams    Spironolactone Other (See Comments)    gynecomastia    Medications Reviewed Today     Reviewed by Vella Raring, RPH-CPP (Pharmacist) on 08/30/20 at 1000  Med List Status: <None>   Medication Order Taking? Sig Documenting Provider Last Dose Status Informant  acetaminophen (TYLENOL) 500 MG tablet [id number]  Take 1,000 mg by mouth every 6 (six) hours as needed for moderate pain or headache.  [provider]  Active Spouse/Significant Other  Alcohol Swabs (B-D SINGLE USE SWABS REGULAR) PADS [id number]  Use to check blood sugar up to 2 times daily [name], DO  Active   allopurinol (ZYLOPRIM) 100 MG tablet [id number]  Take 1 tablet (100 mg total) by mouth daily. Bensimhon,  Shaune Pascal, MD  Active   aspirin 81 MG chewable tablet [id number]  Chew 81 mg by mouth daily. [provider]  Active Spouse/Significant Other  benzonatate (TESSALON PERLES) 100 MG capsule [id number]  Take 1 capsule (100 mg total) by mouth 3 (three) times daily as needed for cough. [name], MD  Active   Blood Glucose Monitoring Suppl (TRUE Antonietta Breach) DEVI [id number]  Use to check blood sugar up to 2 times daily [name], DO  Active   bosutinib (BOSULIF) 100 MG tablet [id number]  Take 100 mg by mouth at bedtime as needed (tired/not feeling well). Take with food. [provider]  Active            Med Note Jeanie Cooks Sep 15, 2019  8:38 AM)    Cholecalciferol 25 MCG (1000 UT) capsule [id number]  Take by mouth. [provider]  Active   EASY TOUCH PEN NEEDLES 31G X 8 MM MISC [id number]  USE 1 NEEDLE DAILY WITH LEVEMIR Karamalegos, [name], DO  Active   empagliflozin (JARDIANCE) 10 MG TABS tablet [id number] Yes Take 1 tablet (10 mg total) by mouth daily before breakfast. Bensimhon, Shaune Pascal, MD Taking Active   ferrous sulfate 325 (65 FE) MG tablet [id number]  Take by mouth. [provider]  Active   gabapentin (NEURONTIN) 300 MG capsule [id number]  TAKE TWO CAPSULES BY MOUTH TWICE DAILY MAX 4 CAPSULE PER DAY - FOR KIDNEY PROTECTION [name], DO  Active   hydrALAZINE (APRESOLINE) 25 MG tablet [id number]  Take 1 tablet (25 mg total) by mouth 3 (three) times daily. Theora Gianotti, NP  Expired 02/02/20 2359   Insulin Glargine ([name] KWIKPEN) 100 UNIT/ML [id number]  Inject 20 Units into the skin daily. [provider]  Active   Insulin Pen Needle 31G X 5 MM MISC [id number]  31 g by Does not apply route as directed. [name], NP  Active Spouse/Significant Other  isosorbide mononitrate (IMDUR) 60 MG 24 hr tablet [id number]  Take 1 tablet (60 mg total) by mouth daily. End, [name], MD  Active   levothyroxine (SYNTHROID) 25 MCG tablet [id number]  TAKE ONE TABLET BY MOUTH EVERY MORNING BEFORE BREAKFAST Karamalegos, [name], DO  Active   losartan (COZAAR) 25 MG tablet [id number]  Take 1 tablet (25 mg total) by mouth daily. Rise Mu, PA-C  Expired 07/17/20 2359   Menthol-Zinc Oxide (GOLD BOND EX) [id number]  Apply 1 application topically daily as needed (muscle pain). [provider]  Active Spouse/Significant Other   metoprolol succinate (TOPROL-XL) 25 MG 24 hr tablet [id number]  TAKE ONE TABLET BY MOUTH ONCE DAILY End, [name], MD  Active   mirabegron ER (MYRBETRIQ) 25 MG TB24 tablet [id number]  Take 1 tablet (25 mg total) by mouth daily. From Urology Debroah Loop, PA-C  Active   Multiple Vitamin (MULTIVITAMIN WITH MINERALS) TABS tablet [id number]  Take 1 tablet by mouth daily. [provider]  Active Spouse/Significant Other  nitroGLYCERIN (NITROSTAT) 0.4 MG SL tablet [id number]  Place 1 tablet (0.4 mg total) under the tongue every 5 (five) minutes x 3 doses as needed for chest pain. Theora Gianotti, NP  Active   omeprazole (PRILOSEC) 20 MG capsule [id number]  Take 20 mg by mouth in the morning.  [provider]  Active Spouse/Significant Other  Polyethyl Glycol-Propyl Glycol (SYSTANE OP) [id number]  Place 1 drop into both eyes 3 (three) times daily as needed (dry/irritated eyes.).  [provider]  Active Spouse/Significant Other  polyethylene glycol (MIRALAX / GLYCOLAX) 17 g packet [id number]  Take 17 g by mouth daily as needed for mild constipation.  [provider]  Active Spouse/Significant Other  potassium chloride (KLOR-CON) 10 MEQ tablet [id number]  Take 1 tablet (10 mEq total) by mouth daily. Karamalegos, [name], DO  Active   ranolazine (RANEXA) 500 MG 12 hr tablet [id number]  TAKE ONE TABLET BY MOUTH TWICE DAILY End, [name], MD  Active   rosuvastatin (CRESTOR) 5 MG tablet [id number]  TAKE ONE TABLET BY MOUTH ONCE DAILY End, [name], MD  Active   simethicone (MYLICON) 80 MG chewable tablet [id number]  Chew 80 mg by mouth every 6 (six) hours as needed for flatulence. [provider]  Active   tamsulosin (FLOMAX) 0.4 MG CAPS capsule [id number]  Take 1 capsule (0.4 mg total) by mouth daily after supper. Rise Mu, PA-C  Active   torsemide (DEMADEX) 20 MG tablet [id number]  Take 3 tablets (60 mg total) by mouth daily. Bensimhon, Shaune Pascal, MD  Active   traMADol (ULTRAM) 50 MG tablet [id number]  TAKE 1 OR 2 TABLETS BY MOUTH AT BEDTIME AS NEEDED [name], DO  Active   TRULICITY 1.5 XH/3.7JI SOPN [id number]  Inject 1.5 mg into the skin once a week. [name], DO  Active            Med Note ([place], BRANDY L   Thu Jun 29, 2020  1:45 PM)    warfarin (COUMADIN) 3 MG tablet [id number]  TAKE 1 TO 1 & 1/2 TABLETS BY MOUTH AS DIRECTED BY THE COUMADIN CLINIC End, [name], MD  Active             Patient Active Problem List   Diagnosis Date Noted   Obstructive sleep apnea 06/29/2020   Urge incontinence of urine 12/22/2019   Trifascicular block 10/20/2019   Wound infection 10/08/2019   Bifascicular block 05/13/2019   Coronary stent patent 04/13/2019   Chronic HFrEF (heart failure with reduced ejection fraction) ([place]) 03/19/2019   PSVT (paroxysmal supraventricular tachycardia) ([place]) 03/19/2019   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 ([place]) 08/24/2018   CKD (chronic kidney disease), stage IV ([place]) 06/04/2018   Osteoarthritis of knees, bilateral 06/02/2018   Hyperlipidemia associated with type 2 diabetes mellitus ([place]) 05/06/2018   Pain of lower extremity 03/06/2018   Duodenitis 01/30/2018   Chronic heart failure with preserved ejection fraction (HFpEF) ([place]) 01/23/2018   Hematochezia    Melena    Rectal bleeding 01/11/2018   AVM (arteriovenous malformation) of colon without hemorrhage    Schatzki's ring    Esophageal dysphagia    Diabetic retinopathy ([place]) 07/08/2017   Ischemic cardiomyopathy 06/26/2017   Cardiomyopathy ([place]) 06/26/2017   Centrilobular emphysema ([place]) 06/13/2017   Fatigue 12/18/2016   Stable angina ([place]) 09/20/2016   Stage 3a chronic kidney disease ([place]) 09/20/2016   Paroxysmal atrial fibrillation ([place]) 08/28/2016   Chronic anticoagulation 08/28/2016   Essential hypertension 08/20/2016   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 heart  with stable angina pectoris ([place]) 08/19/2016   Accelerating angina ([place]) 08/15/2016   Exertional dyspnea 08/12/2016   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   Gynecomastia, male 03/31/2014   Hypertrophy of breast 03/31/2014   Nuclear sclerosis of left eye 10/22/2013   Enthesopathy of ankle and tarsus 04/10/2013   Controlled type 2 diabetes mellitus with diabetic nephropathy ([place]) 04/09/2013   Diabetic neuropathy associated  with type 2 diabetes mellitus ([place]) 04/09/2013   Cataract of left eye 11/25/2011   Corneal opacity 09/30/2011   Pseudophakia of right eye 09/30/2011   GERD (gastroesophageal reflux disease) 09/25/2011   Gout 09/25/2011   Hyperlipidemia LDL goal <70 09/25/2011   Status post cataract extraction 04/11/2011   Cataract extraction status of eye 04/11/2011   Iron deficiency anemia 04/01/2011   Benign localized hyperplasia of prostate without urinary obstruction and other lower urinary tract symptoms (LUTS) 12/10/2010   Enlarged prostate without lower urinary tract symptoms (luts) 12/10/2010   Colon cancer screening 10/11/2010   Hypothyroidism 10/11/2010   Hyperlipidemia 09/20/2003   Essential and other specified forms of tremor 01/05/2001   Essential tremor 01/05/2001    Immunization History  Administered Date(s) Administered   Influenza, High Dose Seasonal PF 12/29/2014, 12/19/2016, 12/22/2017   Influenza, Seasonal, Injecte, Preservative Fre 01/13/2007, 12/13/2008, 01/30/2010, 12/26/2011   Influenza,inj,Quad PF,6+ Mos 12/25/2012, 12/09/2015, 11/26/2018, 12/15/2019   PFIZER(Purple Top)SARS-COV-2 Vaccination 05/13/2019, 06/08/2019, 12/15/2019, 05/19/2020   PPD Test 09/25/2010   Pneumococcal Conjugate-13 01/31/2014   Pneumococcal Polysaccharide-23 06/20/2008, 09/02/2010    Conditions to be addressed/monitored: T2DM, CKD, HF, HLD, atrial fibrillation, OSA, HTN, hypothyroidism  Care Plan : PharmD - Medication Management/Assistance  Updates made by Vella Raring,  RPH-CPP since 08/30/2020 12:00 AM     Problem: Disease Progression      Long-Range Goal: Disease Progression Prevented or Minimized   Start Date: 04/14/2020  Expected End Date: 07/13/2020  This Visit's Progress: On track  Recent Progress: On track  Priority: High  Note:   Current Barriers:  Chronic Disease Management support, education, and care coordination needs related to type 2 diabetes, CKD, HFrEF s/p CardioMEMS, hyperlipidemia, atrial fibrillation, hypertension, coronary artery disease, CHF, hypothyroidism, and CML Financial Note patient has Partial Extra Help/LIS Subsidy Patient enrolled in [place] [employer] health practitioner and [place]) patient assistance program for 2022 calendar year Patient denied for patient assistance for [place] from [employer] for 2022 calendar year Patient enrolled in [place] for [place] copayment assistance (eligibility through 08/28/2021) Limited vision  Pharmacist Clinical Goal(s):  Over the next 90 days, patient will verbalize ability to afford treatment regimen through collaboration with PharmD and provider.    Interventions: 1:1 collaboration with [name], DO regarding development and update of comprehensive plan of care as evidenced by provider attestation and co-signature Inter-disciplinary care team collaboration (see longitudinal plan of care)  Medication Assistance Receive message from [facility] that patient has been approved for assistance for [place]/Heart Failure fund Access patient's approval letter and pharmacy billing info from [employer] Outreach to patient's wife to provide this update Place call to [place] to provide [employer] information for secondary billing. [place] runs claim, confirms billed to insurance and grant successfully and will get Rx ready for patient Will send message to [facility] to update   Patient  Goals/Self-Care Activities Over the next 90 days, patient will:  Check blood sugar, blood pressure and fluid status as directed by providers  Take medications, with assistance of wife, as directed Mrs. [place] manages patient's medications using weekly pillbox as adherence tool Attends scheduled medical appointments  Follow Up Plan: Telephone follow up appointment with care management team member scheduled for: 7/11 at 11:15 am       Patient's preferred pharmacy is:  [facility], [place], [place]  [place] [address] Phone: [phone] Fax: [phone]  [facility], [place] [address] [place] [address] Phone: [phone] Fax: [phone]  [facility] [place], [place] [address] Phone: [phone] Fax: [phone]  [facility] [place], [place] - [address] [place] [address] Phone: [phone] Fax: [phone]   Follow Up:  Patient agrees to Care Plan and Follow-up.  [name], PharmD, Para [place], CPP Clinical Pharmacist [facility] [phone]

## 2020-08-30 NOTE — Progress Notes (Signed)
08/30/2020 4:05 PM   Lucas Caldwell May 16, 1933 [id number]  CC: Chief Complaint  Patient presents with   Urinary Incontinence   HPI: Lucas Caldwell is a 87 y.o. male with PMH BPH with LUTS on Flomax and urge incontinence previously on Myrbetriq 25 mg daily who presents today for evaluation of urge incontinence.  [name] accompanied today by his wife, who contributes to HPI.  Today he reports having ran out of Myrbetriq several months ago.  He primarily complains of daytime leakage and denies bothersome nighttime symptoms.  He reports nocturia x2.  [name] not wearing absorbent pads or underwear for protection.  He takes torsemide 60 mg daily in the morning.  While he previously reported concerns with adding additional medications to his regimen, he states he is sufficiently bothered by his urge incontinence that he would like to resume pharmacotherapy.  He was recently found to have appropriate bladder emptying on Myrbetriq 25 mg.  PVR 5 mL today.  He denies dysuria, flank pain, and gross hematuria.  In office UA today notable for 3+ glucose; urine microscopy pan negative.  PMH: Past Medical History:  Diagnosis Date   Arthritis    "probably in his legs" (08/15/2016)   Blind right eye    BPH (benign prostatic hyperplasia)    Breast asymmetry    Left breast is larger, present for several years.   CAD (coronary artery disease)    a. Cath in the late 90's - reportedly ok;  b. 2014 s/p stenting x 2 @ [facility]; c 08/19/16 Cath/PCI with DES -> RCA, plan to treat LM 70% medically. Seen by surgery and felt to be too high risk for CABG; d. 08/2018 Cath: LM 70 (iFR 0.86), LAD 20ost, 40p, 50/29m, D1 70, LCX patent stent, OM1 30, RCA patent stent, 14m ISR, 40d, RPDA 30. PCWP 8. CO/CI 4.0/2.1; e. 03/2019 PCI to LAD (2.5x15 Resolute Onyx DES).   Chronic combined systolic (congestive) and diastolic (congestive) heart failure ([employer])    a. Previously reduced EF-->50% by echo in 2012;  b. 06/2015 Echo: EF  50-55%  c. 07/2016 Echo: EF 45-50%; d. 12/2017 Echo: EF 55%, Gr1 DD, mild MR; e. 08/2018 Echo: EF 35-40%, mildly dil PA; f. 04/2019 Echo: EF 30-35%, glob HK, nl RV fxn; g. s/p Cardiomems.   Chronic kidney disease (CKD), stage IV (severe) (HCC)    CML (chronic myelocytic leukemia) (HCC)    GERD (gastroesophageal reflux disease)    GIB (gastrointestinal bleeding)    a. 12/2017 3 unit PRBC GIB in setting of coumadin-->Endo/colon multiple duodenal ulcers and a single bleeding ulcer in the proximal ascending colon status post hemostatic clipping x2.   Gout    Hyperlipemia    Hypertension    Hypothyroidism    Iron deficiency anemia    Ischemic cardiomyopathy    a. Previously reduced EF-->50% by echo in 2012;  b. 06/2015 Echo: EF 50-55%, Gr2 DD, mild MR, mildly dil LA, Ao sclerosis, mild TR;  c. 07/2016 Echo: EF 45-50%; d. 12/2017 Echo: EF 55%, Gr1 DD, mild MR; e. 08/2018 Echo: EF 35-40%; f. 04/2019 Echo: EF 30-35%.   Migraine    "in the 1960s" (08/15/2016)   Obstructive sleep apnea    PAF (paroxysmal atrial fibrillation) (HCC)    a. ? Dx 2014-->s/p DCCV;  b. CHA2DS2VASc = 6--> Coumadin.   Prostate cancer ([place])    RBBB    Type II diabetes mellitus [facility])     Surgical History: Past Surgical History:  Procedure Laterality Date   CATARACT EXTRACTION W/ INTRAOCULAR LENS  IMPLANT, BILATERAL Bilateral    COLONOSCOPY N/A 01/13/2018   Procedure: COLONOSCOPY;  Surgeon: Lin Landsman, MD;  Location: [facility] ENDOSCOPY;  Service: Gastroenterology;  Laterality: N/A;   COLONOSCOPY WITH PROPOFOL N/A 01/01/2018   Procedure: COLONOSCOPY WITH PROPOFOL;  Surgeon: Lin Landsman, MD;  Location: [facility] ENDOSCOPY;  Service: Gastroenterology;  Laterality: N/A;   CORONARY ANGIOPLASTY WITH STENT PLACEMENT  09/2012   2 stents   CORONARY STENT INTERVENTION N/A 08/19/2016   Procedure: Coronary Stent Intervention;  Surgeon: [name], MD;  Location: [place] CV LAB;  Service: Cardiovascular;  Laterality: N/A;    CORONARY STENT INTERVENTION N/A 04/13/2019   Procedure: CORONARY STENT INTERVENTION;  Surgeon: [name], MD;  [facility];  Service: Cardiovascular;  Laterality: N/A;   ESOPHAGEAL MANOMETRY N/A 12/16/2017   Procedure: ESOPHAGEAL MANOMETRY (EM);  Surgeon: Lin Landsman, MD;  Location: [facility] ENDOSCOPY;  Service: Gastroenterology;  Laterality: N/A;   ESOPHAGOGASTRODUODENOSCOPY N/A 12/20/2016   Procedure: ESOPHAGOGASTRODUODENOSCOPY (EGD);  Surgeon: Lin Landsman, MD;  Location: [facility] ENDOSCOPY;  Service: Gastroenterology;  Laterality: N/A;   ESOPHAGOGASTRODUODENOSCOPY N/A 01/13/2018   Procedure: ESOPHAGOGASTRODUODENOSCOPY (EGD);  Surgeon: Lin Landsman, MD;  Location: [facility] ENDOSCOPY;  Service: Gastroenterology;  Laterality: N/A;   ESOPHAGOGASTRODUODENOSCOPY (EGD) WITH PROPOFOL N/A 11/03/2017   Procedure: ESOPHAGOGASTRODUODENOSCOPY (EGD) WITH PROPOFOL;  Surgeon: Lin Landsman, MD;  Location: [facility] ENDOSCOPY;  Service: Gastroenterology;  Laterality: N/A;   EYE SURGERY     HERNIA REPAIR     "navel"   LEFT HEART CATH AND CORONARY ANGIOGRAPHY N/A 08/14/2016   Procedure: Left Heart Cath and Coronary Angiography;  Surgeon: [name], MD;  Location: [place] CV LAB;  Service: Cardiovascular;  Laterality: N/A;   PRESSURE SENSOR/CARDIOMEMS N/A 09/23/2019   Procedure: PRESSURE SENSOR/CARDIOMEMS;  Surgeon: Jolaine Artist, MD;  Location: [place] CV LAB;  Service: Cardiovascular;  Laterality: N/A;   RIGHT HEART CATH N/A 09/23/2019   Procedure: RIGHT HEART CATH;  Surgeon: Jolaine Artist, MD;  Location: [place] CV LAB;  Service: Cardiovascular;  Laterality: N/A;   RIGHT/LEFT HEART CATH AND CORONARY ANGIOGRAPHY N/A 09/01/2018   Procedure: RIGHT/LEFT HEART CATH AND CORONARY ANGIOGRAPHY;  Surgeon: [name], MD;  Location: [place] CV LAB;  Service: Cardiovascular;  Laterality: N/A;   RIGHT/LEFT HEART CATH AND CORONARY ANGIOGRAPHY N/A  04/13/2019   Procedure: RIGHT/LEFT HEART CATH AND CORONARY ANGIOGRAPHY;  Surgeon: [name], MD;  Location: [place] CV LAB;  Service: Cardiovascular;  Laterality: N/A;   TOE AMPUTATION Right    "big toe"    Home Medications:  Allergies as of 08/30/2020       Reactions   Clopidogrel Anaphylaxis, Other (See Comments)   altered mental status;  (electrolytes "out of wack" and confusion per family; THEY DO NOT REMEMBER ANY OTHER REACTION)- MSB 10/10/15   Ciprofloxacin Itching   Mirtazapine Diarrhea   Bad dreams   Spironolactone Other (See Comments)   gynecomastia        Medication List        Accurate as of August 30, 2020  4:05 PM. If you have any questions, ask your nurse or doctor.          acetaminophen 500 MG tablet Commonly known as: TYLENOL Take 1,000 mg by mouth every 6 (six) hours as needed for moderate pain or headache.   allopurinol 100 MG tablet Commonly known as: ZYLOPRIM Take 1 tablet (100 mg total)  by mouth daily.   aspirin 81 MG chewable tablet Chew 81 mg by mouth daily.   B-D SINGLE USE SWABS REGULAR Pads Use to check blood sugar up to 2 times daily   [name] KwikPen 100 UNIT/ML Inject 20 Units into the skin daily.   benzonatate 100 MG capsule Commonly known as: Tessalon Perles Take 1 capsule (100 mg total) by mouth 3 (three) times daily as needed for cough.   bosutinib 100 MG tablet Commonly known as: BOSULIF Take 100 mg by mouth at bedtime as needed (tired/not feeling well). Take with food.   Cholecalciferol 25 MCG (1000 UT) capsule Take by mouth.   empagliflozin 10 MG Tabs tablet Commonly known as: JARDIANCE Take 1 tablet (10 mg total) by mouth daily before breakfast.   ferrous sulfate 325 (65 FE) MG tablet Take by mouth.   gabapentin 300 MG capsule Commonly known as: NEURONTIN TAKE TWO CAPSULES BY MOUTH TWICE DAILY MAX 4 CAPSULE PER DAY - FOR KIDNEY PROTECTION   GOLD BOND EX Apply 1 application topically daily as needed  (muscle pain).   hydrALAZINE 25 MG tablet Commonly known as: APRESOLINE Take 1 tablet (25 mg total) by mouth 3 (three) times daily.   Insulin Pen Needle 31G X 5 MM Misc 31 g by Does not apply route as directed.   Easy Touch Pen Needles 31G X 8 MM Misc Generic drug: Insulin Pen Needle USE 1 NEEDLE DAILY WITH LEVEMIR   isosorbide mononitrate 60 MG 24 hr tablet Commonly known as: IMDUR Take 1 tablet (60 mg total) by mouth daily.   levothyroxine 25 MCG tablet Commonly known as: SYNTHROID TAKE ONE TABLET BY MOUTH EVERY MORNING BEFORE BREAKFAST   losartan 25 MG tablet Commonly known as: COZAAR Take 1 tablet (25 mg total) by mouth daily.   metoprolol succinate 25 MG 24 hr tablet Commonly known as: TOPROL-XL TAKE ONE TABLET BY MOUTH ONCE DAILY   mirabegron ER 25 MG Tb24 tablet Commonly known as: Myrbetriq Take 1 tablet (25 mg total) by mouth daily. From Urology   multivitamin with minerals Tabs tablet Take 1 tablet by mouth daily.   nitroGLYCERIN 0.4 MG SL tablet Commonly known as: NITROSTAT Place 1 tablet (0.4 mg total) under the tongue every 5 (five) minutes x 3 doses as needed for chest pain.   omeprazole 20 MG capsule Commonly known as: PRILOSEC Take 20 mg by mouth in the morning.   polyethylene glycol 17 g packet Commonly known as: MIRALAX / GLYCOLAX Take 17 g by mouth daily as needed for mild constipation.   potassium chloride 10 MEQ tablet Commonly known as: KLOR-CON Take 1 tablet (10 mEq total) by mouth daily.   ranolazine 500 MG 12 hr tablet Commonly known as: RANEXA TAKE ONE TABLET BY MOUTH TWICE DAILY   rosuvastatin 5 MG tablet Commonly known as: CRESTOR TAKE ONE TABLET BY MOUTH ONCE DAILY   simethicone 80 MG chewable tablet Commonly known as: MYLICON Chew 80 mg by mouth every 6 (six) hours as needed for flatulence.   SYSTANE OP Place 1 drop into both eyes 3 (three) times daily as needed (dry/irritated eyes.).   tamsulosin 0.4 MG Caps  capsule Commonly known as: FLOMAX Take 1 capsule (0.4 mg total) by mouth daily after supper.   torsemide 20 MG tablet Commonly known as: DEMADEX Take 3 tablets (60 mg total) by mouth daily.   traMADol 50 MG tablet Commonly known as: ULTRAM TAKE 1 OR 2 TABLETS BY MOUTH AT BEDTIME AS NEEDED  True Metrix Meter Devi Use to check blood sugar up to 2 times daily   Trulicity 1.5 TD/9.7CB Sopn Generic drug: Dulaglutide Inject 1.5 mg into the skin once a week.   warfarin 3 MG tablet Commonly known as: COUMADIN Take as directed by the anticoagulation clinic. If you are unsure how to take this medication, talk to your nurse or doctor. Original instructions: TAKE 1 TO 1 & 1/2 TABLETS BY MOUTH AS DIRECTED BY THE COUMADIN CLINIC        Allergies:  Allergies  Allergen Reactions   Clopidogrel Anaphylaxis and Other (See Comments)    altered mental status;  (electrolytes "out of wack" and confusion per family; THEY DO NOT REMEMBER ANY OTHER REACTION)- MSB 10/10/15   Ciprofloxacin Itching   Mirtazapine Diarrhea    Bad dreams    Spironolactone Other (See Comments)    gynecomastia    Family History: Family History  Problem Relation Age of Onset   Brain cancer Father    Diabetes Sister    Heart attack Sister     Social History:   reports that he quit smoking about 47 years ago. His smoking use included cigarettes. He started smoking about 67 years ago. He has a 35.00 pack-year smoking history. He has quit using smokeless tobacco. He reports that he does not drink alcohol and does not use drugs.  Physical Exam: BP 121/71   Pulse 81   Ht 5\' 4"  (1.626 m)   Wt 194 lb (88 kg)   BMI 33.30 kg/m   Constitutional:  Alert and oriented, no acute distress, nontoxic appearing HEENT: [place], AT Cardiovascular: No clubbing, cyanosis, or edema Respiratory: Normal respiratory effort, no increased work of breathing Skin: No rashes, bruises or suspicious lesions Neurologic: Grossly intact, no  focal deficits, moving all 4 extremities Psychiatric: Normal mood and affect  Laboratory Data: Results for orders placed or performed in visit on 08/30/20  Microscopic Examination   Urine  Result Value Ref Range   WBC, UA 0-5 0 - 5 /hpf   RBC 0-2 0 - 2 /hpf   Epithelial Cells (non renal) None seen 0 - 10 /hpf   Bacteria, UA None seen None seen/Few  Urinalysis, Complete  Result Value Ref Range   Specific Gravity, UA 1.015 1.005 - 1.030   pH, UA 6.0 5.0 - 7.5   Color, UA Yellow Yellow   Appearance Ur Clear Clear   Leukocytes,UA Negative Negative   Protein,UA Negative Negative/Trace   Glucose, UA 3+ (A) Negative   Ketones, UA Negative Negative   RBC, UA Negative Negative   Bilirubin, UA Negative Negative   Urobilinogen, Ur 0.2 0.2 - 1.0 mg/dL   Nitrite, UA Negative Negative   Microscopic Examination See below:   Bladder Scan (Post Void Residual) in office  Result Value Ref Range   Scan Result 5    *Note: Due to a large number of results and/or encounters for the requested time period, some results have not been displayed. A complete set of results can be found in Results Review.   Assessment & Plan:   1. Urge incontinence of urine Okay to resume Myrbetriq 25 mg daily.  Will defer PVR in 1 month, as he was previously found to empty appropriately on this medication.  We will plan for symptom recheck with IPSS and PVR in 1 year, sooner if needed.  May consider dose increase in the future to 50 mg daily if his GFR remains greater than 30.  We discussed that  his torsemide is likely playing a role in his daytime urinary symptoms and that he should not reduce his torsemide dose, as this is important for his overall health.  We discussed consideration of condom catheters versus Cunningham clamps in the future to help with his leakage.  Patient wishes to defer this today, but will consider this again in the future if he remains bothered by his symptoms. - Urinalysis, Complete - Bladder  Scan (Post Void Residual) in office - mirabegron ER (MYRBETRIQ) 25 MG TB24 tablet; Take 1 tablet (25 mg total) by mouth daily. From Urology  Dispense: 30 tablet; Refill: 11  Return in about 1 year (around 08/30/2021) for Symptom recheck with IPSS, PVR.  Debroah Loop, PA-C  [facility] [address], [place], [place] [address] [phone]

## 2020-08-31 ENCOUNTER — Ambulatory Visit: Payer: Medicare HMO

## 2020-09-01 LAB — MICROSCOPIC EXAMINATION
Bacteria, UA: NONE SEEN
Epithelial Cells (non renal): NONE SEEN /hpf (ref 0–10)

## 2020-09-01 LAB — URINALYSIS, COMPLETE
Bilirubin, UA: NEGATIVE
Ketones, UA: NEGATIVE
Leukocytes,UA: NEGATIVE
Nitrite, UA: NEGATIVE
Protein,UA: NEGATIVE
RBC, UA: NEGATIVE
Specific Gravity, UA: 1.015 (ref 1.005–1.030)
Urobilinogen, Ur: 0.2 mg/dL (ref 0.2–1.0)
pH, UA: 6 (ref 5.0–7.5)

## 2020-09-04 ENCOUNTER — Telehealth: Payer: Self-pay | Admitting: General Practice

## 2020-09-04 ENCOUNTER — Telehealth: Payer: Medicare HMO

## 2020-09-04 NOTE — Telephone Encounter (Signed)
  Care Management   Follow Up Note   09/04/2020 Name: TYREE FLUHARTY MRN: [id number] DOB: 29-Mar-1933   Referred by: [name], DO Reason for referral : Chronic Care Management (RNCM: Follow up for Chronic Disease Management and Care Coordination Needs)   An unsuccessful telephone outreach was attempted today. The patient was referred to the case management team for assistance with care management and care coordination.   Follow Up Plan: A HIPPA compliant phone message was left for the patient providing contact information and requesting a return call.   Noreene Larsson RN, MSN, [place] Mobile: [phone]

## 2020-09-07 ENCOUNTER — Other Ambulatory Visit: Payer: Self-pay | Admitting: Family Medicine

## 2020-09-07 DIAGNOSIS — E1142 Type 2 diabetes mellitus with diabetic polyneuropathy: Secondary | ICD-10-CM

## 2020-09-07 NOTE — Telephone Encounter (Signed)
Requested medication (s) are due for refill today: no  Requested medication (s) are on the active medication list: yes   Last refill:  09/07/2020  Future visit scheduled: no  Notes to clinic: This prescription was filled on 09/07/2020. Any refills authorized will be placed on file   Requested Prescriptions  Pending Prescriptions Disp Refills   traMADol (ULTRAM) 50 MG tablet [Pharmacy Med Name: tramadol 50 mg tablet] 30 tablet 2    Sig: TAKE 1 OR 2 TABLETS BY MOUTH AT BEDTIME AS NEEDED      Not Delegated - Analgesics:  Opioid Agonists Failed - 09/07/2020  3:03 PM      Failed - This refill cannot be delegated      Failed - Urine Drug Screen completed in last 360 days      Passed - Valid encounter within last 6 months    Recent Outpatient Visits           2 weeks ago Acute diastolic congestive heart failure [facility])   [place], DO   1 month ago Acute non-recurrent frontal sinusitis   [place], DO   2 months ago Controlled type 2 diabetes mellitus with diabetic nephropathy, with long-term current use of insulin [facility])   [place], DO   3 months ago Balance disorder   [place], DO   4 months ago Balance disorder   [place], [name], DO       Future Appointments             In 3 weeks [place], [name], MD [facility], [place]   In 11 months Debroah Loop, [place]

## 2020-09-08 ENCOUNTER — Ambulatory Visit: Payer: Medicare HMO

## 2020-09-08 ENCOUNTER — Ambulatory Visit: Payer: Medicare HMO | Admitting: Pharmacist

## 2020-09-08 ENCOUNTER — Telehealth: Payer: Self-pay

## 2020-09-08 DIAGNOSIS — I10 Essential (primary) hypertension: Secondary | ICD-10-CM

## 2020-09-08 DIAGNOSIS — E1121 Type 2 diabetes mellitus with diabetic nephropathy: Secondary | ICD-10-CM

## 2020-09-08 DIAGNOSIS — I5032 Chronic diastolic (congestive) heart failure: Secondary | ICD-10-CM

## 2020-09-08 DIAGNOSIS — Z794 Long term (current) use of insulin: Secondary | ICD-10-CM

## 2020-09-08 NOTE — Chronic Care Management (AMB) (Signed)
  Care Management   Note  09/08/2020 Name: Lucas Caldwell MRN: [id number] DOB: Jul 15, 1933  Lucas Caldwell is a 87 y.o. year old male who is a primary care patient of [name], DO and is actively engaged with the care management team. I reached out to [name] by phone today to assist with re-scheduling a follow up visit with the RN Case Manager  Follow up plan: Unsuccessful telephone outreach attempt made. A HIPAA compliant phone message was left for the patient providing contact information and requesting a return call.  The care management team will reach out to the patient again over the next 7 days.  If patient returns call to provider office, please advise to call Ethan  at [place], [place], [place], [place] [address] Direct Dial: [phone] [email or web address]: [email or web address]

## 2020-09-08 NOTE — Patient Instructions (Signed)
Visit Information  PATIENT GOALS:  Goals Addressed             This Visit's Progress    Pharmacy Goals       Please check your blood sugar, keep a log of the results and bring this with you to your medical appointments.  Please check your home blood pressure, keep a log of the results and bring this with you to your medical appointments.  Our goal bad cholesterol, or LDL, is less than 70 . This is why it is important to continue taking your rosuvastatin.  Feel free to call me with any questions or concerns. I look forward to our next call!    [name], PharmD, [place] [phone]         The patient verbalized understanding of instructions, educational materials, and care plan provided today and declined offer to receive copy of patient instructions, educational materials, and care plan.   Telephone follow up appointment with care management team member scheduled for: 7/11

## 2020-09-08 NOTE — Chronic Care Management (AMB) (Signed)
Chronic Care Management Pharmacy Note  09/08/2020 Name:  Lucas Caldwell MRN:  [id number] DOB:  [birth year]  Subjective: Lucas Caldwell is an 87 y.o. year old male who is a primary patient of [name], DO.  The CCM team was consulted for assistance with disease management and care coordination needs.    The CCM team was consulted for assistance with chronic disease management and care coordination needs by patient's health plan.   Engaged with patient's wife by telephone for follow up visit in response to provider referral for pharmacy case management and/or care coordination services.   Consent to Services:  The patient was given information about Chronic Care Management services, agreed to services, and gave verbal consent prior to initiation of services.  Please see initial visit note for detailed documentation.   Patient Care Team: [name], DO as PCP - General (Family Medicine) End, [name], MD as PCP - Cardiology (Cardiology) [name], [name], Wilkinson as Pharmacist [name] [name], RN as Case Manager (General Practice)   Recent consult visits: Office Visit with [name] on 6/15  Hospital visits: Patient seen in [name] ED on 6/1 for bronchitis  Objective:  Lab Results  Component Value Date   CREATININE 1.73 (H) 08/16/2020   CREATININE 1.55 (H) 06/29/2020   CREATININE 1.72 (H) 04/13/2020    Lab Results  Component Value Date   HGBA1C 7.1 (A) 06/14/2020   Last diabetic Eye exam:  Lab Results  Component Value Date/Time   HMDIABEYEEXA Retinopathy (A) 12/07/2019 12:00 AM    Last diabetic Foot exam: No results found for: HMDIABFOOTEX   Social History   Tobacco Use  Smoking Status Former   Packs/day: 1.75   Years: 20.00   Pack years: 35.00   Types: Cigarettes   Start date: 1955   Quit date: 1975   Years since quitting: 47.5  Smokeless Tobacco Former   BP Readings  from Last 3 Encounters:  08/30/20 121/71  08/22/20 129/62  08/16/20 102/67   Pulse Readings from Last 3 Encounters:  08/30/20 81  08/22/20 78  08/16/20 75   Wt Readings from Last 3 Encounters:  08/30/20 194 lb (88 kg)  08/22/20 193 lb (87.5 kg)  08/16/20 198 lb 6.6 oz (90 kg)    Assessment: Review of patient past medical history, allergies, medications, health status, including review of consultants reports, laboratory and other test data, was performed as part of comprehensive evaluation and provision of chronic care management services.   SDOH:  (Social Determinants of Health) assessments and interventions performed: none   CCM Care Plan  Allergies  Allergen Reactions   Clopidogrel Anaphylaxis and Other (See Comments)    altered mental status;  (electrolytes "out of wack" and confusion per family; THEY DO NOT REMEMBER ANY OTHER REACTION)- MSB 10/10/15   Ciprofloxacin Itching   Mirtazapine Diarrhea    Bad dreams    Spironolactone Other (See Comments)    gynecomastia    Medications Reviewed Today     Reviewed by Evelina Bucy, CMA (Certified Medical Assistant) on 08/30/20 at 1104  Med List Status: <None>   Medication Order Taking? Sig Documenting Provider Last Dose Status Informant  acetaminophen (TYLENOL) 500 MG tablet [id number] Yes Take 1,000 mg by mouth every 6 (six) hours as needed for moderate pain or headache.  [provider] Taking Active Spouse/Significant Other  Alcohol Swabs (B-D SINGLE USE SWABS REGULAR) PADS [id number] Yes Use to check blood sugar up  to 2 times daily [name], DO Taking Active   allopurinol (ZYLOPRIM) 100 MG tablet [id number] Yes Take 1 tablet (100 mg total) by mouth daily. Bensimhon, Shaune Pascal, MD Taking Active   aspirin 81 MG chewable tablet [id number] Yes Chew 81 mg by mouth daily. [provider] Taking Active Spouse/Significant Other  benzonatate (TESSALON PERLES) 100 MG capsule [id number] Yes Take 1  capsule (100 mg total) by mouth 3 (three) times daily as needed for cough. [name], MD Taking Active   Blood Glucose Monitoring Suppl (TRUE Antonietta Breach) DEVI [id number] Yes Use to check blood sugar up to 2 times daily [name], DO Taking Active   bosutinib (BOSULIF) 100 MG tablet [id number] Yes Take 100 mg by mouth at bedtime as needed (tired/not feeling well). Take with food. [provider] Taking Active            Med Note Jeanie Cooks Sep 15, 2019  8:38 AM)    Cholecalciferol 25 MCG (1000 UT) capsule [id number] Yes Take by mouth. [provider] Taking Active   EASY TOUCH PEN NEEDLES 31G X 8 MM MISC [id number] Yes USE 1 NEEDLE DAILY WITH LEVEMIR Karamalegos, [name], DO Taking Active   empagliflozin (JARDIANCE) 10 MG TABS tablet [id number] Yes Take 1 tablet (10 mg total) by mouth daily before breakfast. Bensimhon, Shaune Pascal, MD Taking Active   ferrous sulfate 325 (65 FE) MG tablet [id number] Yes Take by mouth. [provider] Taking Active   gabapentin (NEURONTIN) 300 MG capsule [id number] Yes TAKE TWO CAPSULES BY MOUTH TWICE DAILY MAX 4 CAPSULE PER DAY - FOR KIDNEY PROTECTION [name], DO Taking Active   hydrALAZINE (APRESOLINE) 25 MG tablet [id number]  Take 1 tablet (25 mg total) by mouth 3 (three) times daily. Theora Gianotti, NP  Expired 02/02/20 2359   Insulin Glargine ([name] KWIKPEN) 100 UNIT/ML [id number] Yes Inject 20 Units into the skin daily. [provider] Taking Active   Insulin Pen Needle 31G X 5 MM MISC [id number] Yes 31 g by Does not apply route as directed. [name], NP Taking Active Spouse/Significant Other  isosorbide mononitrate (IMDUR) 60 MG 24 hr tablet [id number] Yes Take 1 tablet (60 mg total) by mouth daily. End, [name], MD Taking Active   levothyroxine (SYNTHROID) 25 MCG tablet [id number] Yes TAKE ONE TABLET BY MOUTH EVERY MORNING BEFORE BREAKFAST Karamalegos,  [name], DO Taking Active   losartan (COZAAR) 25 MG tablet [id number]  Take 1 tablet (25 mg total) by mouth daily. Rise Mu, PA-C  Expired 07/17/20 2359   Menthol-Zinc Oxide (GOLD BOND EX) [id number] Yes Apply 1 application topically daily as needed (muscle pain). [provider] Taking Active Spouse/Significant Other  metoprolol succinate (TOPROL-XL) 25 MG 24 hr tablet [id number] Yes TAKE ONE TABLET BY MOUTH ONCE DAILY End, [name], MD Taking Active   mirabegron ER (MYRBETRIQ) 25 MG TB24 tablet [id number] Yes Take 1 tablet (25 mg total) by mouth daily. From Urology Debroah Loop, PA-C Taking Active   Multiple Vitamin (MULTIVITAMIN WITH MINERALS) TABS tablet [id number] Yes Take 1 tablet by mouth daily. [provider] Taking Active Spouse/Significant Other  nitroGLYCERIN (NITROSTAT) 0.4 MG SL tablet [id number] Yes Place 1 tablet (0.4 mg total) under the tongue every 5 (five) minutes x 3 doses as needed for chest pain. Theora Gianotti, NP Taking Active   omeprazole (PRILOSEC) 20 MG capsule [id number] Yes Take 20 mg by mouth in the morning.  [provider] Taking Active Spouse/Significant Other  Polyethyl Glycol-Propyl Glycol (SYSTANE OP) [id number] Yes Place 1 drop into both eyes 3 (three) times daily as needed (dry/irritated eyes.).  [provider] Taking Active Spouse/Significant Other  polyethylene glycol (MIRALAX / GLYCOLAX) 17 g packet [id number] Yes Take 17 g by mouth daily as needed for mild constipation.  [provider] Taking Active Spouse/Significant Other  potassium chloride (KLOR-CON) 10 MEQ tablet [id number] Yes Take 1 tablet (10 mEq total) by mouth daily. [name], DO Taking Active   ranolazine (RANEXA) 500 MG 12 hr tablet [id number] Yes TAKE ONE TABLET BY MOUTH TWICE DAILY End, [name], MD Taking Active   rosuvastatin (CRESTOR) 5 MG tablet [id number] Yes TAKE ONE TABLET BY MOUTH ONCE DAILY End,  [name], MD Taking Active   simethicone (MYLICON) 80 MG chewable tablet [id number] Yes Chew 80 mg by mouth every 6 (six) hours as needed for flatulence. [provider] Taking Active   tamsulosin (FLOMAX) 0.4 MG CAPS capsule [id number] Yes Take 1 capsule (0.4 mg total) by mouth daily after supper. Rise Mu, PA-C Taking Active   torsemide (DEMADEX) 20 MG tablet [id number] Yes Take 3 tablets (60 mg total) by mouth daily. Bensimhon, Shaune Pascal, MD Taking Active   traMADol (ULTRAM) 50 MG tablet [id number] Yes TAKE 1 OR 2 TABLETS BY MOUTH AT BEDTIME AS NEEDED [name], DO Taking Active   TRULICITY 1.5 YC/1.4GY SOPN [id number] Yes Inject 1.5 mg into the skin once a week. [name], DO Taking Active            Med Note [facility], BRANDY L   Thu Jun 29, 2020  1:45 PM)    warfarin (COUMADIN) 3 MG tablet [id number] Yes TAKE 1 TO 1 & 1/2 TABLETS BY MOUTH AS DIRECTED BY THE COUMADIN CLINIC End, [name], MD Taking Active             Patient Active Problem List   Diagnosis Date Noted   Obstructive sleep apnea 06/29/2020   Urge incontinence of urine 12/22/2019   Trifascicular block 10/20/2019   Wound infection 10/08/2019   Bifascicular block 05/13/2019   Coronary stent patent 04/13/2019   Chronic HFrEF (heart failure with reduced ejection fraction) ([place]) 03/19/2019   PSVT (paroxysmal supraventricular tachycardia) ([place]) 03/19/2019   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 ([place]) 08/24/2018   CKD (chronic kidney disease), stage IV ([place]) 06/04/2018   Osteoarthritis of knees, bilateral 06/02/2018   Hyperlipidemia associated with type 2 diabetes mellitus ([place]) 05/06/2018   Pain of lower extremity 03/06/2018   Duodenitis 01/30/2018   Chronic heart failure with preserved ejection fraction (HFpEF) ([place]) 01/23/2018   Hematochezia    Melena    Rectal bleeding 01/11/2018   AVM (arteriovenous malformation) of colon without hemorrhage     Schatzki's ring    Esophageal dysphagia    Diabetic retinopathy ([place]) 07/08/2017   Ischemic cardiomyopathy 06/26/2017   Cardiomyopathy ([place]) 06/26/2017   Centrilobular emphysema ([place]) 06/13/2017   Fatigue 12/18/2016   Stable angina ([place]) 09/20/2016   Stage 3a chronic kidney disease ([place]) 09/20/2016   Paroxysmal atrial fibrillation ([place]) 08/28/2016   Chronic anticoagulation 08/28/2016   Essential hypertension 08/20/2016   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 heart with stable angina pectoris ([place]) 08/19/2016   Accelerating angina ([place]) 08/15/2016   Exertional dyspnea 08/12/2016   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   Gynecomastia, male 03/31/2014   Hypertrophy of breast 03/31/2014   Nuclear sclerosis  of left eye 10/22/2013   Enthesopathy of ankle and tarsus 04/10/2013   Controlled type 2 diabetes mellitus with diabetic nephropathy ([place]) 04/09/2013   Diabetic neuropathy associated with type 2 diabetes mellitus ([place]) 04/09/2013   Cataract of left eye 11/25/2011   Corneal opacity 09/30/2011   Pseudophakia of right eye 09/30/2011   GERD (gastroesophageal reflux disease) 09/25/2011   Gout 09/25/2011   Hyperlipidemia LDL goal <70 09/25/2011   Status post cataract extraction 04/11/2011   Cataract extraction status of eye 04/11/2011   Iron deficiency anemia 04/01/2011   Benign localized hyperplasia of prostate without urinary obstruction and other lower urinary tract symptoms (LUTS) 12/10/2010   Enlarged prostate without lower urinary tract symptoms (luts) 12/10/2010   Colon cancer screening 10/11/2010   Hypothyroidism 10/11/2010   Hyperlipidemia 09/20/2003   Essential and other specified forms of tremor 01/05/2001   Essential tremor 01/05/2001    Immunization History  Administered Date(s) Administered   Influenza, High Dose Seasonal PF 12/29/2014, 12/19/2016, 12/22/2017   Influenza, Seasonal, Injecte, Preservative Fre 01/13/2007, 12/13/2008, 01/30/2010, 12/26/2011    Influenza,inj,Quad PF,6+ Mos 12/25/2012, 12/09/2015, 11/26/2018, 12/15/2019   PFIZER(Purple Top)SARS-COV-2 Vaccination 05/13/2019, 06/08/2019, 12/15/2019, 05/19/2020   PPD Test 09/25/2010   Pneumococcal Conjugate-13 01/31/2014   Pneumococcal Polysaccharide-23 06/20/2008, 09/02/2010    Conditions to be addressed/monitored: T2DM, CKD, HF, HLD, atrial fibrillation, OSA, HTN, hypothyroidism  Care Plan : PharmD - Medication Management/Assistance  Updates made by Vella Raring, RPH-CPP since 09/08/2020 12:00 AM     Problem: Disease Progression      Long-Range Goal: Disease Progression Prevented or Minimized   Start Date: 04/14/2020  Expected End Date: 07/13/2020  This Visit's Progress: On track  Recent Progress: On track  Priority: High  Note:   Current Barriers:  Chronic Disease Management support, education, and care coordination needs related to type 2 diabetes, CKD, HFrEF s/p CardioMEMS, hyperlipidemia, atrial fibrillation, hypertension, coronary artery disease, CHF, hypothyroidism, and CML Financial Note patient has Partial Extra Help/LIS Subsidy Patient enrolled in [place] [employer] health practitioner and [place]) patient assistance program for 2022 calendar year Patient denied for patient assistance for [place] from [employer] for 2022 calendar year Patient enrolled in [place] for [place] copayment assistance (eligibility through 08/28/2021) Limited vision  Pharmacist Clinical Goal(s):  Over the next 90 days, patient will verbalize ability to afford treatment regimen through collaboration with PharmD and provider.  Need for evaluation of medication adherence, as identified by patient's health plan, based on medication adherence quality data  Interventions: 1:1 collaboration with [name], DO regarding development and update of comprehensive plan of care as evidenced by provider attestation and co-signature Inter-disciplinary care team  collaboration (see longitudinal plan of care) Perform chart review. Patient seen for Office Visit with [place] on 6/15 Provider advised patient Okay to resume Myrbetriq 25 mg daily  Medication Adherence: Today assess adherence to losartan 25 mg (as identified by health plan).  From review of chart, note: Patient's losartan dose was increased to losartan 50 mg daily by [name] on 02/02/2020 Dose then decreased back to losartan 25 mg daily by [name], PA at [facility] on 04/18/2020 (updated Rx sent) as patient/wife attributed symptoms of dizziness/generalized weakness to previous dose increase. Review dispensing history in chart.  Rx for losartan 25 mg - 2 tablets/day refilled by pharmacy again after direction change.  Updated Rx from 2/1 reflecting losartan 25 mg - 1 tablet daily filled for 90 day supply on 4/25 [name] reports patient taking  losartan 25 mg daily as directed by Cardiologist since February. Denies missed doses. Note patient continues to use of weekly pillbox as adherence aid, as filled by spouse  Medication Assistance [name] requests assistance as patient received refill of Jardiance from [facility] covered by [employer], but only for 14 day supply Place call to speak with [place] who states that [place] filled on 6/15 for #14 tablets as this was quantity pharmacy had in stock. Reports Rx was split billed through both patient's [employer] and then [employer]. Reports $21 charged to [employer] for this fill [employer] ordered and patient will receive 30 day supply for next refill Request pharmacy also send refill request to provider, so patient will receive full 30 day supplies for future refills Outreach to patient's wife to provide this update [name] asks about possible medicaiton assistance for Myrbetriq Advise wife that patient does not meet criteria  from from manufacturer Copy) patient assistance program as Myrbetriq is covered through his plan and [employer] Medicare does not currently allow tier exception requests for Myrbetriq Advise wife to continue to follow up with Urology office to see if samples available to reduce cost through year from copayment   Patient Goals/Self-Care Activities Over the next 90 days, patient will:  Check blood sugar, blood pressure and fluid status as directed by providers  Take medications, with assistance of wife, as directed Mrs. Langford manages patient's medications using weekly pillbox as adherence tool Attends scheduled medical appointments  Follow Up Plan: Telephone follow up appointment with care management team member scheduled for: 7/11 at 11:15 am      Patient's preferred pharmacy is:  [facility], [place], [address] [place] [address] [place] [address] Phone: [phone] Fax: [facility], [place] [address] [place] [address] Phone: [phone] Fax: [phone]  [facility] ([facility]) - [place], [place] [address] Phone: [phone] Fax: [phone]  [facility] [place], [place] - [address] [place] [address] Phone: [phone] Fax: [phone]  Uses pill box? Yes  Follow Up:  Patient agrees to Care Plan and Follow-up.  [name], PharmD, Para [place], CPP Clinical Pharmacist [facility] [phone]

## 2020-09-11 ENCOUNTER — Other Ambulatory Visit (HOSPITAL_COMMUNITY): Payer: Self-pay | Admitting: Internal Medicine

## 2020-09-11 DIAGNOSIS — B351 Tinea unguium: Secondary | ICD-10-CM | POA: Diagnosis not present

## 2020-09-11 DIAGNOSIS — E1142 Type 2 diabetes mellitus with diabetic polyneuropathy: Secondary | ICD-10-CM | POA: Diagnosis not present

## 2020-09-11 DIAGNOSIS — L851 Acquired keratosis [keratoderma] palmaris et plantaris: Secondary | ICD-10-CM | POA: Diagnosis not present

## 2020-09-12 NOTE — Chronic Care Management (AMB) (Signed)
  Care Management   Note  09/12/2020 Name: PUNEET MASONER MRN: [id number] DOB: 08-Dec-1933  HIKEEM ANDERSSON is a 87 y.o. year old male who is a primary care patient of [name], DO and is actively engaged with the care management team. I reached out to [name] by phone today to assist with re-scheduling a follow up visit with the RN Case Manager  Follow up plan: Unsuccessful telephone outreach attempt made. A HIPAA compliant phone message was left for the patient providing contact information and requesting a return call.  The care management team will reach out to the patient again over the next 7 days.  If patient returns call to provider office, please advise to call [facility]  at [place], [place], [place], [place] [address] Direct Dial: [phone] [email or web address]: [email or web address]

## 2020-09-13 ENCOUNTER — Ambulatory Visit: Payer: Medicare HMO

## 2020-09-13 ENCOUNTER — Other Ambulatory Visit: Payer: Self-pay

## 2020-09-13 ENCOUNTER — Telehealth: Payer: Self-pay | Admitting: Internal Medicine

## 2020-09-13 DIAGNOSIS — M6281 Muscle weakness (generalized): Secondary | ICD-10-CM | POA: Diagnosis not present

## 2020-09-13 DIAGNOSIS — R2681 Unsteadiness on feet: Secondary | ICD-10-CM | POA: Diagnosis not present

## 2020-09-13 DIAGNOSIS — R2689 Other abnormalities of gait and mobility: Secondary | ICD-10-CM

## 2020-09-13 NOTE — Therapy (Signed)
[place] MAIN [facility] SERVICES [address] [place], [place], [address] Phone: [phone]   Fax:  [phone]  Physical Therapy Treatment/RECERT  Patient Details  Name: Lucas Caldwell MRN: [id number] Date of Birth: Feb 01, 1934 Referring Provider (PT): [name], DO   Encounter Date: 09/13/2020   PT End of Session - 09/14/20 0810     Visit Number 15    Number of Visits 49    Date for PT Re-Evaluation 12/06/20    Authorization Time Period eval performed on 06/06/2020    PT Start Time 1059    PT Stop Time 1145    PT Time Calculation (min) 46 min    Equipment Utilized During Treatment Gait belt    Activity Tolerance Patient tolerated treatment well    Behavior During Therapy [facility] for tasks assessed/performed             Past Medical History:  Diagnosis Date   Arthritis    "probably in his legs" (08/15/2016)   Blind right eye    BPH (benign prostatic hyperplasia)    Breast asymmetry    Left breast is larger, present for several years.   CAD (coronary artery disease)    a. Cath in the late 90's - reportedly ok;  b. 2014 s/p stenting x 2 @ [facility]; c 08/19/16 Cath/PCI with DES -> RCA, plan to treat LM 70% medically. Seen by surgery and felt to be too high risk for CABG; d. 08/2018 Cath: LM 70 (iFR 0.86), LAD 20ost, 40p, 50/60m D1 70, LCX patent stent, OM1 30, RCA patent stent, 457mSR, 40d, RPDA 30. PCWP 8. CO/CI 4.0/2.1; e. 03/2019 PCI to LAD (2.5x15 Resolute Onyx DES).   Chronic combined systolic (congestive) and diastolic (congestive) heart failure ([facility]   a. Previously reduced EF-->50% by echo in 2012;  b. 06/2015 Echo: EF 50-55%  c. 07/2016 Echo: EF 45-50%; d. 12/2017 Echo: EF 55%, Gr1 DD, mild MR; e. 08/2018 Echo: EF 35-40%, mildly dil PA; f. 04/2019 Echo: EF 30-35%, glob HK, nl RV fxn; g. s/p Cardiomems.   Chronic kidney disease (CKD), stage IV (severe) (HCC)    CML (chronic myelocytic leukemia) (HCC)    GERD (gastroesophageal  reflux disease)    GIB (gastrointestinal bleeding)    a. 12/2017 3 unit PRBC GIB in setting of coumadin-->Endo/colon multiple duodenal ulcers and a single bleeding ulcer in the proximal ascending colon status post hemostatic clipping x2.   Gout    Hyperlipemia    Hypertension    Hypothyroidism    Iron deficiency anemia    Ischemic cardiomyopathy    a. Previously reduced EF-->50% by echo in 2012;  b. 06/2015 Echo: EF 50-55%, Gr2 DD, mild MR, mildly dil LA, Ao sclerosis, mild TR;  c. 07/2016 Echo: EF 45-50%; d. 12/2017 Echo: EF 55%, Gr1 DD, mild MR; e. 08/2018 Echo: EF 35-40%; f. 04/2019 Echo: EF 30-35%.   Migraine    "in the 1960s" (08/15/2016)   Obstructive sleep apnea    PAF (paroxysmal atrial fibrillation) (HCC)    a. ? Dx 2014-->s/p DCCV;  b. CHA2DS2VASc = 6--> Coumadin.   Prostate cancer ([facility]   RBBB    Type II diabetes mellitus ([facility]    Past Surgical History:  Procedure Laterality Date   CATARACT EXTRACTION W/ INTRAOCULAR LENS  IMPLANT, BILATERAL Bilateral    COLONOSCOPY N/A 01/13/2018   Procedure: COLONOSCOPY;  Surgeon: VaLin LandsmanMD;  Location: [facility] ENDOSCOPY;  Service: Gastroenterology;  Laterality: N/A;   COLONOSCOPY WITH PROPOFOL N/A 01/01/2018   Procedure: COLONOSCOPY WITH PROPOFOL;  Surgeon: Lin Landsman, MD;  Location: [facility] ENDOSCOPY;  Service: Gastroenterology;  Laterality: N/A;   CORONARY ANGIOPLASTY WITH STENT PLACEMENT  09/2012   2 stents   CORONARY STENT INTERVENTION N/A 08/19/2016   Procedure: Coronary Stent Intervention;  Surgeon: [name], MD;  Location: [place] CV LAB;  Service: Cardiovascular;  Laterality: N/A;   CORONARY STENT INTERVENTION N/A 04/13/2019   Procedure: CORONARY STENT INTERVENTION;  Surgeon: [name], MD;  Location: [place] CV LAB;  Service: Cardiovascular;  Laterality: N/A;   ESOPHAGEAL MANOMETRY N/A 12/16/2017   Procedure: ESOPHAGEAL MANOMETRY (EM);  Surgeon: Lin Landsman, MD;  Location: [facility]  ENDOSCOPY;  Service: Gastroenterology;  Laterality: N/A;   ESOPHAGOGASTRODUODENOSCOPY N/A 12/20/2016   Procedure: ESOPHAGOGASTRODUODENOSCOPY (EGD);  Surgeon: Lin Landsman, MD;  Location: [facility] ENDOSCOPY;  Service: Gastroenterology;  Laterality: N/A;   ESOPHAGOGASTRODUODENOSCOPY N/A 01/13/2018   Procedure: ESOPHAGOGASTRODUODENOSCOPY (EGD);  Surgeon: Lin Landsman, MD;  Location: [facility] ENDOSCOPY;  Service: Gastroenterology;  Laterality: N/A;   ESOPHAGOGASTRODUODENOSCOPY (EGD) WITH PROPOFOL N/A 11/03/2017   Procedure: ESOPHAGOGASTRODUODENOSCOPY (EGD) WITH PROPOFOL;  Surgeon: Lin Landsman, MD;  Location: [facility] ENDOSCOPY;  Service: Gastroenterology;  Laterality: N/A;   EYE SURGERY     HERNIA REPAIR     "navel"   LEFT HEART CATH AND CORONARY ANGIOGRAPHY N/A 08/14/2016   Procedure: Left Heart Cath and Coronary Angiography;  Surgeon: [name], MD;  Location: [place] CV LAB;  Service: Cardiovascular;  Laterality: N/A;   PRESSURE SENSOR/CARDIOMEMS N/A 09/23/2019   Procedure: PRESSURE SENSOR/CARDIOMEMS;  Surgeon: Jolaine Artist, MD;  Location: [place] CV LAB;  Service: Cardiovascular;  Laterality: N/A;   RIGHT HEART CATH N/A 09/23/2019   Procedure: RIGHT HEART CATH;  Surgeon: Jolaine Artist, MD;  Location: [place] CV LAB;  Service: Cardiovascular;  Laterality: N/A;   RIGHT/LEFT HEART CATH AND CORONARY ANGIOGRAPHY N/A 09/01/2018   Procedure: RIGHT/LEFT HEART CATH AND CORONARY ANGIOGRAPHY;  Surgeon: [name], MD;  Location: [place] CV LAB;  Service: Cardiovascular;  Laterality: N/A;   RIGHT/LEFT HEART CATH AND CORONARY ANGIOGRAPHY N/A 04/13/2019   Procedure: RIGHT/LEFT HEART CATH AND CORONARY ANGIOGRAPHY;  Surgeon: [name], MD;  Location: [place] CV LAB;  Service: Cardiovascular;  Laterality: N/A;   TOE AMPUTATION Right    "big toe"    There were no vitals filed for this visit.   Subjective Assessment - 09/13/20 1101     Subjective  Pt presents to PT after prolonged absence. Pt reports he has been "down." Pt says his R foot has been giving him trouble d/t bunion and he had it taped yesterday. Pt says that has helped. Pt was also out of town after his sister passed away during which pt also developed bronchitis. He has been treated with antbiotics for his bronchitis. Pt reports only minor pain in R foot currently. Pt reports he is still having dizziness. Pt reports he has "almost fallen" since he's been here last. He says dizziness is worse when he first gets out of bed in the morning. Pt says he feels light-headed.    Patient is accompained by: Family member    Pertinent History Pt is an 87 y/o male presenting to physical therapy today with spouse with reports of difficulty with balance, dizziness, and walking. Pt states he started feeling "swimmy headed" approximately one year ago and that it is affecting his balance with walking and has  been progressively worsening. Pt reports he feels he is moving when he's still. He denies spinning sensation. He reports he sometimes has headache but says "they are not that bad." Pt reports feeling SOB with walking and household tasks (his doctor is aware). Pt denies unintentional weight gain or loss. Pt reports 1 fall the last 6 months. Per spouse pt tripped at time of fall and do not provide further details. Pt with extensive PMH. Relevant PMH per chart: Exertional dyspnea, centrilobular emphysema, angina, CAD, acute on chronic HFrEF, controlled DMII with diabetic nephropathy, diabetic neuropathy,  gout, CKD, essential HTN, paroxysmal atrial fibrillation, chronic anticoagulation, CKD, fatigue, iron deficiency anemia, pain of LE, OA of B knees, hx of partial ray amputation of R great toe, s/p cataract extraction status of eye, enthesopathy of ankle and tarsus, GERD, hyperlipidemia, hypothyroidism, nuclear sclerosis of L eye, psedophakia of R eye, ischemic cardiomyopathy, esophageal dysphagia, chronic  heart failure with preserved ejection fraction, PSVT, coronary stent patent, bifascicular block, trifascicular block, wound infection, urge incontinence of urine.    Limitations House hold activities;Lifting;Standing;Walking;Writing   Pt reports when he tries to write his hand shakes a lot   How long can you sit comfortably? not limited    How long can you stand comfortably? 5 minutes    How long can you walk comfortably? 10 minutes    Diagnostic tests DG Chest 2 View  from 07/06/2019 per chart: "  IMPRESSION:  No acute abnormalities."    Patient Stated Goals Feel more steady with walking    Currently in Pain? Yes    Pain Location Foot    Pain Orientation Right            TREATMENT - Reassessment of goals  FOTO: 56 (decreased, likely d/t prolonged absence from PT, not performing HEP, recent illness, family member passing)  [place]: 58% (decrease)  FGA: 14/30 (decrease of one point)  10MWT: 0.67 m/s without SPC (decrease)  5XSTS: 19 seconds without UE assist (previously 12 seconds, but with use of BUEs to assist)  [place] Jans[place]: deferred to future session d/t time  BP positional change: Supine: 124/63 HR 75 Seated: symptomatic 112/62 HR 74 Standing: 107/60 HR 79 till symptomatic   Patient's condition has the potential to improve in response to therapy. Maximum improvement is yet to be obtained. The anticipated improvement is attainable and reasonable in a generally predictable time.      PT Education - 09/14/20 0809     Education Details goal reassessment, BP positional change testing/findings, POC, indications for PT    Person(s) Educated Patient;Spouse    Methods Explanation    Comprehension Verbalized understanding              PT Short Term Goals - 09/14/20 3300       PT SHORT TERM GOAL #1   Title Patient will be independent in home exercise program to improve strength/mobility for better functional independence with ADLs.    Baseline 06/06/2020: HEP to be initiated next  visit; 5/11: Pt HEP to be updated/advanced; 6/29: not yet indep d/t prolonged absence from PT    Time 6    Period Weeks    Status On-going    Target Date 10/25/20               PT Long Term Goals - 09/14/20 7622       PT LONG TERM GOAL #1   Title Patient will increase FOTO score to equal to or greater than 54 to demonstrate  statistically significant improvement in mobility and quality of life.    Baseline 06/06/2020: 46 (risk-adjusted 45); 5/11 73%    Time 12    Period Weeks    Status Achieved      PT LONG TERM GOAL #2   Title Patient will increase 10 meter walk test to >1.46ms as to improve gait speed for better community ambulation and to reduce fall risk.    Baseline 06/06/2020: 0.59 m/s with SPC; 5/11: 0.83 m/s SPC; 6/29: 0.67 m/s with SPC    Time 12    Period Weeks    Status On-going    Target Date 12/06/20      PT LONG TERM GOAL #3   Title Patient will increase Berg Balance score by > 6 points to demonstrate decreased fall risk during functional activities.    Baseline 06/06/2020: 40/56;  5/11: 46/56; 6/29: deferred d/t time    Time 12    Period Weeks    Status Partially Met    Target Date 12/06/20      PT LONG TERM GOAL #4   Title Patient (> 662years old) will complete five times sit to stand test in < 15 seconds indicating an increased LE strength and improved balance.    Baseline 06/06/2020: 18 sec performed with BUE assist off chair; 5/11: 12 seconds using BUEs    Time 12    Period Weeks    Status Achieved      PT LONG TERM GOAL #5   Title Patient will reduce dizziness handicap inventory score by at least 18 points, for less dizziness with ADLs and increased safety with home and work tasks.    Baseline 06/06/2020: 48% (moderate handicap); 5/11: 36%; 6/29: 58%    Time 12    Period Weeks    Status On-going    Target Date 12/06/20      PT LONG TERM GOAL #6   Title Patient will increase Functional Gait Assessment score to >20/30 as to reduce fall risk and  improve dynamic gait safety with community ambulation.    Baseline 06/13/2020: 15/30 5/11: deferred this session due to time; 6/29: 14/30    Time 11    Period Weeks    Status On-going    Target Date 12/06/20                   Plan - 09/14/20 0816     Clinical Impression Statement Goals reassessed this session for recertification. Pt has seen decrease in scores on all testing likely secondary to prolonged absence from PT (pt last at PT on 08/07/2020) d/t family member passing away and illness (bronchitis), where pt has also not been performing HEP much. Orthostatics assessed d/t pt reports of continued dizziness and light-headed feeling with positional changes. Pt BP was the following: 123/64 mmHg supine, 112/62 mmHg seated and pt symptomatic, 107/60 mmHg standing. D/t drop in systolic BP along with reported symptoms of light-headedness with positional change, PT instructed pt and his spouse to contact his doctor regarding his BP. Pt and his spouse verbalized understanding. The pt will benefit from further skilled PT in order to improve strength, gait and balance in order to decrease fall risk.    Personal Factors and Comorbidities Age;Comorbidity 3+;Fitness;Comorbidity 1;Comorbidity 2;Past/Current Experience;Time since onset of injury/illness/exacerbation    Comorbidities Pertinent PMH/comorbidities per chart: Exertional dyspnea, centrilobular emphysema, angina, CAD, acute on chronic HFrEF, controlled DMII with diabetic nephropathy, diabetic neuropathy, gout, CKD, essential HTN, paroxysmal atrial fibrillation, chronic anticoagulation, CKD,  fatigue, iron deficiency anemia, pain of LE, OA of B knees, hx of partial ray amputation of R great toe, s/p cataract extraction status of eye, enthesopathy of ankle and tarsus, GERD, hyperlipidemia, hypothyroidism, nuclear sclerosis of L eye, psedophakia of R eye, ischemic cardiomyopathy, esophageal dysphagia, chronic heart failure with preserved ejection  fraction, PSVT, coronary stent patent, bifascicular block, trifascicular block, wound infection, urge incontinence of urine.    Examination-Activity Limitations Bathing;Bed Mobility;Bend;Caring for Others;Carry;Continence;Dressing;Hygiene/Grooming;Stairs;Squat;Reach Overhead;Locomotion Level;Lift;Stand;Toileting;Transfers    Examination-Participation Restrictions Medication Management;Cleaning;Meal Prep;Community Activity;Driving;Laundry;Shop;Yard Work;Volunteer    Stability/Clinical Decision Making Evolving/Moderate complexity    Rehab Potential Good    PT Frequency 2x / week    PT Duration 12 weeks    PT Treatment/Interventions ADLs/Self Care Home Management;Biofeedback;Canalith Repostioning;Cryotherapy;Electrical Stimulation;Moist Heat;Traction;Ultrasound;DME Instruction;Gait training;Stair training;Functional mobility training;Therapeutic activities;Therapeutic exercise;Balance training;Neuromuscular re-education;Patient/family education;Orthotic Fit/Training;Manual techniques;Wheelchair mobility training;Passive range of motion;Dry needling;Energy conservation;Taping;Vestibular;Joint Manipulations;Spinal Manipulations;Aquatic Therapy    PT Next Visit Plan dynamic balance, endurance, strengthening, reassess BERG    PT Home Exercise Plan no changes on this date    Consulted and Agree with Plan of Care Patient;Family member/caregiver    Family Member Consulted spouse             Patient will benefit from skilled therapeutic intervention in order to improve the following deficits and impairments:  Abnormal gait, Dizziness, Increased fascial restricitons, Impaired sensation, Improper body mechanics, Pain, Cardiopulmonary status limiting activity, Decreased coordination, Decreased mobility, Postural dysfunction, Decreased activity tolerance, Decreased endurance, Decreased range of motion, Hypomobility, Decreased strength, Decreased balance, Difficulty walking, Impaired flexibility  Visit  Diagnosis: Unsteadiness on feet  Muscle weakness (generalized)  Other abnormalities of gait and mobility     Problem List Patient Active Problem List   Diagnosis Date Noted   Obstructive sleep apnea 06/29/2020   Urge incontinence of urine 12/22/2019   Trifascicular block 10/20/2019   Wound infection 10/08/2019   Bifascicular block 05/13/2019   Coronary stent patent 04/13/2019   Chronic HFrEF (heart failure with reduced ejection fraction) ([place]) 03/19/2019   PSVT (paroxysmal supraventricular tachycardia) ([place]) 03/19/2019   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 ([place]) 08/24/2018   CKD (chronic kidney disease), stage IV ([place]) 06/04/2018   Osteoarthritis of knees, bilateral 06/02/2018   Hyperlipidemia associated with type 2 diabetes mellitus ([place]) 05/06/2018   Pain of lower extremity 03/06/2018   Duodenitis 01/30/2018   Chronic heart failure with preserved ejection fraction (HFpEF) ([place]) 01/23/2018   Hematochezia    Melena    Rectal bleeding 01/11/2018   AVM (arteriovenous malformation) of colon without hemorrhage    Schatzki's ring    Esophageal dysphagia    Diabetic retinopathy ([place]) 07/08/2017   Ischemic cardiomyopathy 06/26/2017   Cardiomyopathy ([place]) 06/26/2017   Centrilobular emphysema ([place]) 06/13/2017   Fatigue 12/18/2016   Stable angina ([place]) 09/20/2016   Stage 3a chronic kidney disease ([place]) 09/20/2016   Paroxysmal atrial fibrillation ([place]) 08/28/2016   Chronic anticoagulation 08/28/2016   Essential hypertension 08/20/2016   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 heart with stable angina pectoris ([place]) 08/19/2016   Accelerating angina ([place]) 08/15/2016   Exertional dyspnea 08/12/2016   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[place]) 12/29/2015   Gynecomastia, male 03/31/2014   Hypertrophy of breast 03/31/2014   Nuclear sclerosis of left eye 10/22/2013   Enthesopathy of ankle and tarsus 04/10/2013   Controlled type 2 diabetes mellitus with diabetic nephropathy  ([place]) 04/09/2013   Diabetic neuropathy associated with type 2 diabetes mellitus ([place]) 04/09/2013   Cataract of left  eye 11/25/2011   Corneal opacity 09/30/2011   Pseudophakia of right eye 09/30/2011   GERD (gastroesophageal reflux disease) 09/25/2011   Gout 09/25/2011   Hyperlipidemia LDL goal <70 09/25/2011   Status post cataract extraction 04/11/2011   Cataract extraction status of eye 04/11/2011   Iron deficiency anemia 04/01/2011   Benign localized hyperplasia of prostate without urinary obstruction and other lower urinary tract symptoms (LUTS) 12/10/2010   Enlarged prostate without lower urinary tract symptoms (luts) 12/10/2010   Colon cancer screening 10/11/2010   Hypothyroidism 10/11/2010   Hyperlipidemia 09/20/2003   Essential and other specified forms of tremor 01/05/2001   Essential tremor 01/05/2001   Ricard Dillon PT, DPT  09/14/2020, 8:23 AM  [place] MAIN [facility] SERVICES [address] [place], [place], [address] Phone: [phone]   Fax:  [phone]  Name: Lucas Caldwell MRN: [id number] Date of Birth: 1933-10-06

## 2020-09-13 NOTE — Telephone Encounter (Signed)
Pt c/o BP issue: STAT if pt c/o blurred vision, one-sided weakness or slurred speech  1. What are your last 5 BP readings? No readings  2. Are you having any other symptoms (ex. Dizziness, headache, blurred vision, passed out)? Dizziness, blurred vision   3. What is your BP issue? Patient in lobby to talk to nurse

## 2020-09-13 NOTE — Telephone Encounter (Signed)
Patient came in after going to rehab with complaints of dizziness. Staff requested that I assess patient due to dizziness and did not see message stating that he had blurry vision at that time I brought him back. Message was sent to different nurse. Patient wife requested they see Dr. Saunders Revel today when they walked in room. Advised that he was not currently in the office and would need to schedule appointment or reschedule if necessary. Patients wife states they did blood pressures over at rehab while laying, sitting, and standing and had some changes in blood pressures stating it was around systolic of 010'U. Reviewed that with change in position this can happen and he needs to do so slowly and holding onto something. Blood pressure checked today and it was 120/80 and they did not have any other complaints at this time other than dizziness. Patient was scheduled to see provider this Friday for further assessment and recommendations. They were appreciative for the blood pressure check with no further questions for now.

## 2020-09-15 ENCOUNTER — Ambulatory Visit (INDEPENDENT_AMBULATORY_CARE_PROVIDER_SITE_OTHER): Payer: Medicare HMO | Admitting: Physician Assistant

## 2020-09-15 ENCOUNTER — Other Ambulatory Visit: Payer: Self-pay

## 2020-09-15 ENCOUNTER — Encounter: Payer: Self-pay | Admitting: Physician Assistant

## 2020-09-15 VITALS — BP 146/82 | HR 77 | Ht 64.0 in | Wt 194.0 lb

## 2020-09-15 DIAGNOSIS — I5022 Chronic systolic (congestive) heart failure: Secondary | ICD-10-CM | POA: Diagnosis not present

## 2020-09-15 DIAGNOSIS — Z7901 Long term (current) use of anticoagulants: Secondary | ICD-10-CM

## 2020-09-15 DIAGNOSIS — G4733 Obstructive sleep apnea (adult) (pediatric): Secondary | ICD-10-CM

## 2020-09-15 DIAGNOSIS — E1169 Type 2 diabetes mellitus with other specified complication: Secondary | ICD-10-CM | POA: Diagnosis not present

## 2020-09-15 DIAGNOSIS — I10 Essential (primary) hypertension: Secondary | ICD-10-CM | POA: Diagnosis not present

## 2020-09-15 DIAGNOSIS — E785 Hyperlipidemia, unspecified: Secondary | ICD-10-CM

## 2020-09-15 DIAGNOSIS — N183 Chronic kidney disease, stage 3 unspecified: Secondary | ICD-10-CM

## 2020-09-15 DIAGNOSIS — I480 Paroxysmal atrial fibrillation: Secondary | ICD-10-CM

## 2020-09-15 DIAGNOSIS — I25118 Atherosclerotic heart disease of native coronary artery with other forms of angina pectoris: Secondary | ICD-10-CM

## 2020-09-15 DIAGNOSIS — I951 Orthostatic hypotension: Secondary | ICD-10-CM | POA: Diagnosis not present

## 2020-09-15 DIAGNOSIS — Z789 Other specified health status: Secondary | ICD-10-CM

## 2020-09-15 DIAGNOSIS — F515 Nightmare disorder: Secondary | ICD-10-CM

## 2020-09-15 MED ORDER — CARVEDILOL 3.125 MG PO TABS: 3.1250 mg | ORAL_TABLET | Freq: Two times a day (BID) | ORAL | 3 refills | Status: DC

## 2020-09-15 NOTE — Progress Notes (Signed)
Office Visit    Patient Name: Lucas Caldwell Date of Encounter: 09/17/2020  PCP:  [name], [place]  Cardiologist:  [name], MD  Advanced Practice Provider:  No care team member to display Electrophysiologist:  None :[phone]}   Chief Complaint    Chief Complaint  Patient presents with   OTHER    Patient c/o blood pressure issues, per wife he was told his BP was dropping when he changes positions. Medications verbally reviewed with patient.     87 y.o. male with history of coronary artery disease, HFrEF, s/p CardioMEMS and followed in the advanced heart failure clinic, obstructive sleep apnea, mixed ischemic and nonischemic cardiomyopathy, hypertension, hyperlipidemia, DM2, paroxysmal atrial fibrillation on warfarin, stage III CKD, CML, iron deficiency anemia, GI bleed due to duodenal ulcers, BPH, and hypothyroidism, and who presents for issues with BP.  Past Medical History    Past Medical History:  Diagnosis Date   Arthritis    "probably in his legs" (08/15/2016)   Blind right eye    BPH (benign prostatic hyperplasia)    Breast asymmetry    Left breast is larger, present for several years.   CAD (coronary artery disease)    a. Cath in the late 90's - reportedly ok;  b. 2014 s/p stenting x 2 @ [facility]; c 08/19/16 Cath/PCI with DES -> RCA, plan to treat LM 70% medically. Seen by surgery and felt to be too high risk for CABG; d. 08/2018 Cath: LM 70 (iFR 0.86), LAD 20ost, 40p, 50/63m, D1 70, LCX patent stent, OM1 30, RCA patent stent, 1m ISR, 40d, RPDA 30. PCWP 8. CO/CI 4.0/2.1; e. 03/2019 PCI to LAD (2.5x15 Resolute Onyx DES).   Chronic combined systolic (congestive) and diastolic (congestive) heart failure ([place])    a. Previously reduced EF-->50% by echo in 2012;  b. 06/2015 Echo: EF 50-55%  c. 07/2016 Echo: EF 45-50%; d. 12/2017 Echo: EF 55%, Gr1 DD, mild MR; e. 08/2018 Echo: EF 35-40%, mildly dil PA; f. 04/2019 Echo: EF 30-35%,  glob HK, nl RV fxn; g. s/p Cardiomems.   Chronic kidney disease (CKD), stage IV (severe) (HCC)    CML (chronic myelocytic leukemia) (HCC)    GERD (gastroesophageal reflux disease)    GIB (gastrointestinal bleeding)    a. 12/2017 3 unit PRBC GIB in setting of coumadin-->Endo/colon multiple duodenal ulcers and a single bleeding ulcer in the proximal ascending colon status post hemostatic clipping x2.   Gout    Hyperlipemia    Hypertension    Hypothyroidism    Iron deficiency anemia    Ischemic cardiomyopathy    a. Previously reduced EF-->50% by echo in 2012;  b. 06/2015 Echo: EF 50-55%, Gr2 DD, mild MR, mildly dil LA, Ao sclerosis, mild TR;  c. 07/2016 Echo: EF 45-50%; d. 12/2017 Echo: EF 55%, Gr1 DD, mild MR; e. 08/2018 Echo: EF 35-40%; f. 04/2019 Echo: EF 30-35%.   Migraine    "in the 1960s" (08/15/2016)   Obstructive sleep apnea    PAF (paroxysmal atrial fibrillation) (HCC)    a. ? Dx 2014-->s/p DCCV;  b. CHA2DS2VASc = 6--> Coumadin.   Prostate cancer ([place])    RBBB    Type II diabetes mellitus ([place])    Past Surgical History:  Procedure Laterality Date   CATARACT EXTRACTION W/ INTRAOCULAR LENS  IMPLANT, BILATERAL Bilateral    COLONOSCOPY N/A 01/13/2018   Procedure: COLONOSCOPY;  Surgeon: Lin Landsman, MD;  Location: [facility]  ENDOSCOPY;  Service: Gastroenterology;  Laterality: N/A;   COLONOSCOPY WITH PROPOFOL N/A 01/01/2018   Procedure: COLONOSCOPY WITH PROPOFOL;  Surgeon: Lin Landsman, MD;  Location: [facility] ENDOSCOPY;  Service: Gastroenterology;  Laterality: N/A;   CORONARY ANGIOPLASTY WITH STENT PLACEMENT  09/2012   2 stents   CORONARY STENT INTERVENTION N/A 08/19/2016   Procedure: Coronary Stent Intervention;  Surgeon: [name], MD;  Location: [place] CV LAB;  Service: Cardiovascular;  Laterality: N/A;   CORONARY STENT INTERVENTION N/A 04/13/2019   Procedure: CORONARY STENT INTERVENTION;  Surgeon: [name], MD;  Location: [place] CV LAB;  Service:  Cardiovascular;  Laterality: N/A;   ESOPHAGEAL MANOMETRY N/A 12/16/2017   Procedure: ESOPHAGEAL MANOMETRY (EM);  Surgeon: Lin Landsman, MD;  Location: [facility] ENDOSCOPY;  Service: Gastroenterology;  Laterality: N/A;   ESOPHAGOGASTRODUODENOSCOPY N/A 12/20/2016   Procedure: ESOPHAGOGASTRODUODENOSCOPY (EGD);  Surgeon: Lin Landsman, MD;  Location: [facility] ENDOSCOPY;  Service: Gastroenterology;  Laterality: N/A;   ESOPHAGOGASTRODUODENOSCOPY N/A 01/13/2018   Procedure: ESOPHAGOGASTRODUODENOSCOPY (EGD);  Surgeon: Lin Landsman, MD;  Location: [facility] ENDOSCOPY;  Service: Gastroenterology;  Laterality: N/A;   ESOPHAGOGASTRODUODENOSCOPY (EGD) WITH PROPOFOL N/A 11/03/2017   Procedure: ESOPHAGOGASTRODUODENOSCOPY (EGD) WITH PROPOFOL;  Surgeon: Lin Landsman, MD;  Location: [facility] ENDOSCOPY;  Service: Gastroenterology;  Laterality: N/A;   EYE SURGERY     HERNIA REPAIR     "navel"   LEFT HEART CATH AND CORONARY ANGIOGRAPHY N/A 08/14/2016   Procedure: Left Heart Cath and Coronary Angiography;  Surgeon: [name], MD;  Location: [place] CV LAB;  Service: Cardiovascular;  Laterality: N/A;   PRESSURE SENSOR/CARDIOMEMS N/A 09/23/2019   Procedure: PRESSURE SENSOR/CARDIOMEMS;  Surgeon: Jolaine Artist, MD;  Location: [place] CV LAB;  Service: Cardiovascular;  Laterality: N/A;   RIGHT HEART CATH N/A 09/23/2019   Procedure: RIGHT HEART CATH;  Surgeon: Jolaine Artist, MD;  Location: [place] CV LAB;  Service: Cardiovascular;  Laterality: N/A;   RIGHT/LEFT HEART CATH AND CORONARY ANGIOGRAPHY N/A 09/01/2018   Procedure: RIGHT/LEFT HEART CATH AND CORONARY ANGIOGRAPHY;  Surgeon: [name], MD;  Location: [place] CV LAB;  Service: Cardiovascular;  Laterality: N/A;   RIGHT/LEFT HEART CATH AND CORONARY ANGIOGRAPHY N/A 04/13/2019   Procedure: RIGHT/LEFT HEART CATH AND CORONARY ANGIOGRAPHY;  Surgeon: [name], MD;  Location: [place] CV LAB;  Service: Cardiovascular;   Laterality: N/A;   TOE AMPUTATION Right    "big toe"    Allergies  Allergies  Allergen Reactions   Clopidogrel Anaphylaxis and Other (See Comments)    altered mental status;  (electrolytes "out of wack" and confusion per family; THEY DO NOT REMEMBER ANY OTHER REACTION)- MSB 10/10/15   Ciprofloxacin Itching   Mirtazapine Diarrhea    Bad dreams    Spironolactone Other (See Comments)    gynecomastia    History of Present Illness    MANFORD SPRONG is a 87 y.o. male with PMH as above. Mr. Gasparro is an 87 year old male with PMH as above.   He underwent 08/2016 LHC with PCI of the RCA 2/2 persistent DOE complicated by CKD. He has needed frequent outpatient adjustment to his diuretics.    He underwent repeat diagnostic LHC 08/2018 revealing a 40%s LM with abnormal IFR of 0.86 thought secondary to pLAD disease with only a small gradient involving the ostial left main.  Nitrate was increased; however, he continued to experience DOE despite further titration of outpatient diuretics.    03/2019 R/LHC showed multivessel CAD similar to prior catheterizations with  moderately elevated L/R heart filling pressure. 60%s ostial LM with 60-70% mLAD dz noted. He had nonobstructive circumflex and RCA disease.  PCI/DES performed to most significant mLAD stenosis.  PCWP was 24mmHg with IV diuresis over the next 2d and noted improved sx.  Lopressor had increased to 50 mg daily with hydralazine added for additional afterload reduction. Given his prior history of GI bleed with recent PCI / DAPT, warfarin placed on hold.  Discharged on torsemide 40 mg every morning and 20 mg in the evening. He was scheduled for cardiac rehab. Cr precluded ACE/ARB/ARNI/MRA.   On 04/19/19, he called the office with ongoing exertional dizziness and DOE and sent to ED then ruled out for PE. It was noted outpatient carotid dopplers oculd be considered.   Echo showed LVEF 30-35%.   It was wondered if he had urinary retention -  torsemide was increased to 40mg  BID and hydralazine to 50mg  TID to improve afterload reduction. ACE/ARB again deferred.He was referred to the advanced HF clinic in [place]. It was noted some of his dyspnea could be 2/2 ticagrelor but continued.    Since that time, he is continued to note issues with breathing, as well as positional dizziness and that he feels unsteady when standing.    He has been diagnosed with obstructive sleep apnea and continues to await his CPAP. [name] now s/p CardioMEMS device and continues to be followed at the advanced heart failure clinic in [place].   At his 04/2020 clinic visit, losartan reduced to 25 mg daily.  He was last seen by his primary cardiologist 06/29/2020 and reported feeling better than 6 months prior.  He still reported exertional dyspnea, though felt as if it was improving.  He continued to note issues with balance and dizziness, though improved with physical therapy.  Today, 09/15/2020, he returns to clinic and reports a significant increase in his presyncope/dizziness recently.  He reports staying well-hydrated with 2 bottles of [employer] per day.  Orthostatics completed and positive for orthostatic hypotension.  No chest pain reported, though ongoing difficulties reported with breathing.  Significant wheezing heard on exam with wife's report that the patient does often wheeze to an audible level.  He reports that he is able to feel his heart racing when changing clothes, and sometimes he feels his heart skip a beat.  He also is frequently short of breath when changing clothes.  He denies any syncopal episodes or falls.  No signs or symptoms of bleeding.  No reported bloating.  [name] monitoring his BP at home with SBP at its highest in the 140s and lowest 130s.  He reports that he is still waiting on a CPAP machine and is uncertain the reason that is taking so long to shift him.  He continues to use 2 pillows for comfort.  He reports that he does not add salt  to his food and watches his diet.  [name] compliant with his medications.  Home Medications   Current Outpatient Medications  Medication Instructions   acetaminophen (TYLENOL) 1,000 mg, Oral, Every 6 hours PRN   Alcohol Swabs (B-D SINGLE USE SWABS REGULAR) PADS Use to check blood sugar up to 2 times daily   allopurinol (ZYLOPRIM) 100 mg, Oral, Daily   aspirin 81 mg, Oral, Daily   [name] KwikPen 20 Units, Subcutaneous, Daily   benzonatate (TESSALON PERLES) 100 mg, Oral, 3 times daily PRN   Blood Glucose Monitoring Suppl (TRUE METRIX METER) DEVI Use to check blood sugar  up to 2 times daily   bosutinib (BOSULIF) 100 mg, Oral, At bedtime PRN, Take with food.   carvedilol (COREG) 3.125 mg, Oral, 2 times daily   Cholecalciferol 25 MCG (1000 UT) capsule Oral   EASY TOUCH PEN NEEDLES 31G X 8 MM MISC USE 1 NEEDLE DAILY WITH LEVEMIR   empagliflozin (JARDIANCE) 10 mg, Oral, Daily with breakfast, Please make an appointment for further refills   ferrous sulfate 325 (65 FE) MG tablet Oral   gabapentin (NEURONTIN) 300 MG capsule TAKE TWO CAPSULES BY MOUTH TWICE DAILY MAX 4 CAPSULE PER DAY - FOR KIDNEY PROTECTION   hydrALAZINE (APRESOLINE) 25 mg, Oral, 3 times daily   Insulin Pen Needle 31 g, Does not apply, As directed   isosorbide mononitrate (IMDUR) 60 mg, Oral, Daily   levothyroxine (SYNTHROID) 25 MCG tablet TAKE ONE TABLET BY MOUTH EVERY MORNING BEFORE BREAKFAST   losartan (COZAAR) 25 mg, Oral, Daily   Menthol-Zinc Oxide (GOLD BOND EX) 1 application, Apply externally, Daily PRN   mirabegron ER (MYRBETRIQ) 25 mg, Oral, Daily, From Urology    Multiple Vitamin (MULTIVITAMIN WITH MINERALS) TABS tablet 1 tablet, Oral, Daily   nitroGLYCERIN (NITROSTAT) 0.4 mg, Sublingual, Every 5 min x3 PRN   omeprazole (PRILOSEC) 20 mg, Oral, Every morning   Polyethyl Glycol-Propyl Glycol (SYSTANE OP) 1 drop, Both Eyes, 3 times daily PRN   polyethylene glycol (MIRALAX / GLYCOLAX) 17 g, Oral, Daily PRN   potassium  chloride (KLOR-CON) 10 MEQ tablet 10 mEq, Oral, Daily   ranolazine (RANEXA) 500 MG 12 hr tablet TAKE ONE TABLET BY MOUTH TWICE DAILY   rosuvastatin (CRESTOR) 5 MG tablet TAKE ONE TABLET BY MOUTH ONCE DAILY   simethicone (MYLICON) 80 mg, Oral, Every 6 hours PRN   tamsulosin (FLOMAX) 0.4 mg, Oral, Daily after supper   torsemide (DEMADEX) 60 mg, Oral, Daily   traMADol (ULTRAM) 50 MG tablet TAKE 1 OR 2 TABLETS BY MOUTH AT BEDTIME AS NEEDED   Trulicity 1.5 mg, Subcutaneous, Weekly   warfarin (COUMADIN) 3 MG tablet TAKE 1 TO 1 & 1/2 TABLETS BY MOUTH AS DIRECTED BY THE COUMADIN CLINIC     Review of Systems    He reports a sudden increase in dizziness, as well as ongoing issues with breathing.  He becomes short of breath with racing heart rate and occasional palpitations, even while changing clothes.  His wife reports audible wheezing. He denies chest pain, pnd, orthopnea, n, v, syncope, edema, weight gain, or early satiety.   All other systems reviewed and are otherwise negative except as noted above.  Physical Exam    VS:  BP (!) 146/82 (BP Location: Left Arm, Patient Position: Sitting, Cuff Size: Normal)   Pulse 77   Ht 5\' 4"  (1.626 m)   Wt 194 lb (88 kg)   SpO2 98%   BMI 33.30 kg/m  , BMI Body mass index is 33.3 kg/m. GEN: Well nourished, well developed, in no acute distress.  Joined by his wife HEENT: normal. Neck: Supple, no JVD, carotid bruits, or masses. Cardiac: RRR, no murmurs, rubs, or gallops. No clubbing, cyanosis.  Mild to moderate bilateral lower extremity edema.  Radials/DP/PT 2+ and equal bilaterally.  Respiratory:  Respirations regular and unlabored, bilateral wheezing/expiratory wheeze noted. GI: Soft, nontender, nondistended, BS + x 4. MS: no deformity or atrophy. Skin: warm and dry, no rash. Neuro:  Strength and sensation are intact. Psych: Normal affect.  Accessory Clinical Findings    ECG personally reviewed by me today -sinus rhythm  with first-degree AV block, PR  interval 224 ms, QRS 180 ms, first-degree AV block, left anterior fascicular block, right bundle branch block, QTC 506 ms--no acute changes from previous tracing  Orthostatics today Orthostatic Vital Signs:    BP  HR  Lying:  135/77  79 Sitting:  139/79  79 Standing:  116/80  87 3 minutes: 129/75  85  Reported dizziness with position changes   VITALS Reviewed today   Temp Readings from Last 3 Encounters:  08/16/20 99.4 F (37.4 C) (Oral)  07/28/20 98.3 F (36.8 C) (Oral)  06/14/20 (!) 97.3 F (36.3 C) (Temporal)   BP Readings from Last 3 Encounters:  09/15/20 (!) 146/82  08/30/20 121/71  08/22/20 129/62   Pulse Readings from Last 3 Encounters:  09/15/20 77  08/30/20 81  08/22/20 78    Wt Readings from Last 3 Encounters:  09/15/20 194 lb (88 kg)  08/30/20 194 lb (88 kg)  08/22/20 193 lb (87.5 kg)     LABS  reviewed today   Lab Results  Component Value Date   WBC 13.6 (H) 08/16/2020   HGB 12.3 (L) 08/16/2020   HCT 37.3 (L) 08/16/2020   MCV 87.6 08/16/2020   PLT 168 08/16/2020   Lab Results  Component Value Date   CREATININE 1.73 (H) 08/16/2020   BUN 23 08/16/2020   NA 135 08/16/2020   K 4.7 08/16/2020   CL 100 08/16/2020   CO2 24 08/16/2020   Lab Results  Component Value Date   ALT 13 08/16/2020   AST 21 08/16/2020   ALKPHOS 53 08/16/2020   BILITOT 0.8 08/16/2020   Lab Results  Component Value Date   CHOL 123 06/29/2020   HDL 29 (L) 06/29/2020   LDLCALC 65 06/29/2020   TRIG 170 (H) 06/29/2020   CHOLHDL 4.2 06/29/2020    Lab Results  Component Value Date   HGBA1C 7.1 (A) 06/14/2020   Lab Results  Component Value Date   TSH 3.850 05/04/2019     STUDIES/PROCEDURES reviewed today   Echo 04/23/19  1. Left ventricular ejection fraction, by visual estimation, is 30 to  35% (previous 35-40). The left ventricle has moderate to severely decreased function. There  is mildly increased left ventricular hypertrophy.   2. The left ventricle  demonstrates global hypokinesis, lateral wall  motion best preserved.   3. Global right ventricle has normal systolic function.The right  ventricular size is normal. No increase in right ventricular wall  thickness.   4. Left atrial size was normal.   5. TR signal is inadequate for assessing pulmonary artery systolic  pressure.   R/LHC 04/13/19 Conclusions: Multivessel coronary artery, similar to prior catheterization in 08/2018.  There is diffuse disease involving the ostial through mid LAD, up to 70%.  Prior iFR and 08/2018 showed sequential LAD lesions were hemodynamically significant. Moderately elevated left heart filling pressure and pulmonary artery pressure. Mildly elevated right heart filling pressure. Mildly reduced [place] cardiac output/index. Successful PCI to mid LAD using Resolute Onyx 2.5 x 15 mm drug eluting stent with <10% residual stenosis and TIMI-3 flow. Recommendations: Dual antiplatelet therapy with aspirin and ticagrelor for 3 months, at which time we will consider restarting warfarin and dropping ticagrelor. Left heart pressures are significantly higher than prior catheterization in 08/2018.  Patient needs further diuresis  and optimization of evidence-based heart failure therapy, including afterload reduction.   01/2019 PFTs Spirometry Data Is Acceptable and Reproducible  Moderate Restrictive lung disease Disease with Significant Broncho-Dilator Response  Consider outpatient Pulmonary Consultation if needed Clinical Correlation Advised   [place] 01/22/2019 The patient was monitored for 10 days, 16 hours. 8 days, 23 hours were adequate for analysis. The predominant rhythm was sinus with an average rate of 83 bpm (range 59-128 bpm in sinus). There were rare PAC's and PVC's. Multiple (25) atrial runs were observed, lasting up to 16 beats. The maximal rate was 174 bpm. There was no sustained arrhythmia or prolonged pause. Patient triggered events correspond to sinus rhythm  and PVC's. Predominantly sinus rhythm with rare PVC's.  Multiple brief runs of PSVT noted.  Assessment & Plan    Orthostatic hypotension  -- Reports acute increase in dizziness with orthostatic vitals positive today.  Suspect dizziness as 2/2 orthostatic hypotension.  Recommended adequate hydration, though with total daily fluids under 2 L/day and salt under 2 g/day.  Slow position changes recommended, especially from sitting to standing given orthostatics today.  Also discussed wear compression stockings and abdominal binders but with patient preference to defer.  Encouraged to use his cane.  If ongoing symptoms at RTC, consider repeat carotids but will defer for now. Previous cardiac monitoring as directly below.  Tachypalpitations History of pSVT, PAF, PAC/PVCs --He does note some racing HR / dyspnea as in past.  EKG today NSR without arrhythmia but Qtc prolonged and instructed to avoid QT prolonging medications as directly below. 2020 cardiac monitoring without sustained arrhythmia or pauses and showed NSR and pSVT with ectopy. Known history of PAF on metoprolol. Today, pt requested to trial off Toprol to see if improvement in nightmares and instead try Coreg. Agreeable to restart Toprol if no change in dreams on Coreg. Continue [place] with Warfarin for PAF. No s/sx of bleeding. Follow-up with warfarin clinic as directed. .  Prolonged Qtc --Qtc prolonged with PVCs on EKG rhythm strip, which could be contributing to the prolonged interval. Recommend caution on Ranexa. Avoid QT prolonging medications. Ensure electrolytes at goal. Reassess with repeat EKG at RTC.  Chronic HFrEF --Continues to report dyspnea, though euvolemic and well compensated on exam.  NYHA class II symptoms.  Continue BB with trial of Coreg per pt request today, empagliflozin, losartan, torsemide.  Continue follow-up with the advanced heart failure clinic in [place], including cardioMEMS monitoring.    Coronary artery disease  without angina --No recent or current chest pain. DOE suspected as multifactorial with expiratory wheeze noted today. Continue Imdur  and Ranexa. Monitor QT on Ranexa. Continue risk factor modification with dietary changes / increased activity as tolerated. Continue ASA and warfarin, BB, and statin.   HTN, goal BP <130/80 --Blood pressure elevated.  Will trial Coreg per pt request and defer escalation of other antihypertensives at this time to prevent multiple medication changes at once. Reassess at clinic. If BP still elevated, consider increased dose Losartan. Reviewed Na restrictions.    Chronic kidney disease stage III --Closely monitor renal function.   History of GI bleed --Denies any signs or symptoms consistent with GI bleed today.  Continue PPI and to monitor on ASA and Warfarin.   HLD, LDL goal below 70   --Continue statin. LDL goal <70.  OSA --Recently diagnosed and awaiting CPAP. Encouraged tx and reviewed the benefits.    Restrictive lung dz --Follow-up with pulmonology as recommended.  DM2 --Glycemic control recommended. Continue ARB with comorbid HTN.   Medication management, pt request for medication changes  --Reports recent nightmares with concern this is due to medications. Requests trial off metoprolol. Will start Coreg 3.125mg  BID  in its place, though uncertain if this will result in any relief of sx. Pt will restart Toprol if no difference in sx on Coreg. Of note, we did review that the last added rx was Jardiance. He does not feel sx related to [place], even  though added most recently. ---  Medication changes: Trial of Coreg per pt request given concern that medications are the cause of nightmares. Holding Toprol and will transition back to metoprolol if no difference in sx on Coreg. Future considerations: Carotids Disposition: RTC 1 month   *Please be aware that the above documentation was completed voice recognition software and may contain dictation  errors.      Arvil Chaco, PA-C 09/17/2020

## 2020-09-15 NOTE — Patient Instructions (Signed)
Medication Instructions:  STOP Metoprolol  START Coreg 3.125 mg TWICE a day  *If you need a refill on your cardiac medications before your next appointment, please call your pharmacy*   Lab Work: None  Testing/Procedures: None  Follow-Up: At [facility], you and your health needs are our priority.  As part of our continuing mission to provide you with exceptional heart care, we have created designated Provider Care Teams.  These Care Teams include your primary Cardiologist (physician) and Advanced Practice Providers (APPs -  Physician Assistants and Nurse Practitioners) who all work together to provide you with the care you need, when you need it.  We recommend signing up for the patient portal called "MyChart".  Sign up information is provided on this After Visit Summary.  MyChart is used to connect with patients for Virtual Visits (Telemedicine).  Patients are able to view lab/test results, encounter notes, upcoming appointments, etc.  Non-urgent messages can be sent to your provider as well.   To learn more about what you can do with MyChart, go to [email or web address].    Your next appointment:   1 month(s)  The format for your next appointment:   In Person  Provider:   [name], PA-C   Other Instructions Change position slowly, this helps to reduce episodes of "dropping" your BP, reduce likelihood of dizziness/lightheadedness, and prevent falls related to moving too quick resulting with near syncopal episodes (passing out)

## 2020-09-19 NOTE — Chronic Care Management (AMB) (Signed)
  Care Management   Note  09/19/2020 Name: [name] MRN: [id number] DOB: 03-04-1934  Lucas Caldwell is a 87 y.o. year old male who is a primary care patient of [name], DO and is actively engaged with the care management team. I reached out to [name] by phone today to assist with re-scheduling a follow up visit with the RN Case Manager  Follow up plan: Telephone appointment with care management team member scheduled for:10/02/2020  Noreene Larsson, [place], [place], [place] [address] Direct Dial: [phone] [email or web address]: [email or web address]

## 2020-09-20 ENCOUNTER — Ambulatory Visit: Payer: Medicare HMO | Attending: Family Medicine

## 2020-09-20 ENCOUNTER — Other Ambulatory Visit: Payer: Self-pay

## 2020-09-20 DIAGNOSIS — M6281 Muscle weakness (generalized): Secondary | ICD-10-CM | POA: Diagnosis not present

## 2020-09-20 DIAGNOSIS — R2681 Unsteadiness on feet: Secondary | ICD-10-CM | POA: Insufficient documentation

## 2020-09-20 DIAGNOSIS — R2689 Other abnormalities of gait and mobility: Secondary | ICD-10-CM | POA: Insufficient documentation

## 2020-09-20 NOTE — Therapy (Signed)
[place] MAIN [facility] [address] [place], [place], [address] Phone: [phone]   Fax:  [phone]  Physical Therapy Treatment  Patient Details  Name: Lucas Caldwell MRN: [id number] Date of Birth: [birth year] Referring Provider (PT): [name], DO   Encounter Date: 09/20/2020   PT End of Session - 09/20/20 1523     Visit Number 16    Number of Visits 49    Date for PT Re-Evaluation 12/06/20    Authorization Time Period eval performed on 06/06/2020    PT Start Time 1017    PT Stop Time 1100    PT Time Calculation (min) 43 min    Equipment Utilized During Treatment Gait belt    Activity Tolerance Patient tolerated treatment well    Behavior During Therapy [facility] for tasks assessed/performed             Past Medical History:  Diagnosis Date   Arthritis    "probably in his legs" (08/15/2016)   Blind right eye    BPH (benign prostatic hyperplasia)    Breast asymmetry    Left breast is larger, present for several years.   CAD (coronary artery disease)    a. Cath in the late 90's - reportedly ok;  b. 2014 s/p stenting x 2 @ [facility]; c 08/19/16 Cath/PCI with DES -> RCA, plan to treat LM 70% medically. Seen by surgery and felt to be too high risk for CABG; d. 08/2018 Cath: LM 70 (iFR 0.86), LAD 20ost, 40p, 50/48m D1 70, LCX patent stent, OM1 30, RCA patent stent, 467mSR, 40d, RPDA 30. PCWP 8. CO/CI 4.0/2.1; e. 03/2019 PCI to LAD (2.5x15 Resolute Onyx DES).   Chronic combined systolic (congestive) and diastolic (congestive) heart failure ([place]   a. Previously reduced EF-->50% by echo in 2012;  b. 06/2015 Echo: EF 50-55%  c. 07/2016 Echo: EF 45-50%; d. 12/2017 Echo: EF 55%, Gr1 DD, mild MR; e. 08/2018 Echo: EF 35-40%, mildly dil PA; f. 04/2019 Echo: EF 30-35%, glob HK, nl RV fxn; g. s/p Cardiomems.   Chronic kidney disease (CKD), stage IV (severe) (HCC)    CML (chronic myelocytic leukemia) (HCC)    GERD (gastroesophageal reflux  disease)    GIB (gastrointestinal bleeding)    a. 12/2017 3 unit PRBC GIB in setting of coumadin-->Endo/colon multiple duodenal ulcers and a single bleeding ulcer in the proximal ascending colon status post hemostatic clipping x2.   Gout    Hyperlipemia    Hypertension    Hypothyroidism    Iron deficiency anemia    Ischemic cardiomyopathy    a. Previously reduced EF-->50% by echo in 2012;  b. 06/2015 Echo: EF 50-55%, Gr2 DD, mild MR, mildly dil LA, Ao sclerosis, mild TR;  c. 07/2016 Echo: EF 45-50%; d. 12/2017 Echo: EF 55%, Gr1 DD, mild MR; e. 08/2018 Echo: EF 35-40%; f. 04/2019 Echo: EF 30-35%.   Migraine    "in the 1960s" (08/15/2016)   Obstructive sleep apnea    PAF (paroxysmal atrial fibrillation) (HCC)    a. ? Dx 2014-->s/p DCCV;  b. CHA2DS2VASc = 6--> Coumadin.   Prostate cancer ([facility]   RBBB    Type II diabetes mellitus ([facility]    Past Surgical History:  Procedure Laterality Date   CATARACT EXTRACTION W/ INTRAOCULAR LENS  IMPLANT, BILATERAL Bilateral    COLONOSCOPY N/A 01/13/2018   Procedure: COLONOSCOPY;  Surgeon: VaLin LandsmanMD;  Location: [facility] ENDOSCOPY;  Service: Gastroenterology;  Laterality: N/A;   COLONOSCOPY WITH PROPOFOL N/A 01/01/2018   Procedure: COLONOSCOPY WITH PROPOFOL;  Surgeon: Lin Landsman, MD;  Location: [facility] ENDOSCOPY;  Service: Gastroenterology;  Laterality: N/A;   CORONARY ANGIOPLASTY WITH STENT PLACEMENT  09/2012   2 stents   CORONARY STENT INTERVENTION N/A 08/19/2016   Procedure: Coronary Stent Intervention;  Surgeon: [name], MD;  Location: [place] CV LAB;  Service: Cardiovascular;  Laterality: N/A;   CORONARY STENT INTERVENTION N/A 04/13/2019   Procedure: CORONARY STENT INTERVENTION;  Surgeon: [name], MD;  Location: [place] CV LAB;  Service: Cardiovascular;  Laterality: N/A;   ESOPHAGEAL MANOMETRY N/A 12/16/2017   Procedure: ESOPHAGEAL MANOMETRY (EM);  Surgeon: Lin Landsman, MD;  Location: [facility] ENDOSCOPY;   Service: Gastroenterology;  Laterality: N/A;   ESOPHAGOGASTRODUODENOSCOPY N/A 12/20/2016   Procedure: ESOPHAGOGASTRODUODENOSCOPY (EGD);  Surgeon: Lin Landsman, MD;  Location: [facility] ENDOSCOPY;  Service: Gastroenterology;  Laterality: N/A;   ESOPHAGOGASTRODUODENOSCOPY N/A 01/13/2018   Procedure: ESOPHAGOGASTRODUODENOSCOPY (EGD);  Surgeon: Lin Landsman, MD;  Location: [facility] ENDOSCOPY;  Service: Gastroenterology;  Laterality: N/A;   ESOPHAGOGASTRODUODENOSCOPY (EGD) WITH PROPOFOL N/A 11/03/2017   Procedure: ESOPHAGOGASTRODUODENOSCOPY (EGD) WITH PROPOFOL;  Surgeon: Lin Landsman, MD;  Location: [facility] ENDOSCOPY;  Service: Gastroenterology;  Laterality: N/A;   EYE SURGERY     HERNIA REPAIR     "navel"   LEFT HEART CATH AND CORONARY ANGIOGRAPHY N/A 08/14/2016   Procedure: Left Heart Cath and Coronary Angiography;  Surgeon: [name], MD;  Location: [place] CV LAB;  Service: Cardiovascular;  Laterality: N/A;   PRESSURE SENSOR/CARDIOMEMS N/A 09/23/2019   Procedure: PRESSURE SENSOR/CARDIOMEMS;  Surgeon: Jolaine Artist, MD;  Location: [place] CV LAB;  Service: Cardiovascular;  Laterality: N/A;   RIGHT HEART CATH N/A 09/23/2019   Procedure: RIGHT HEART CATH;  Surgeon: Jolaine Artist, MD;  Location: [place] CV LAB;  Service: Cardiovascular;  Laterality: N/A;   RIGHT/LEFT HEART CATH AND CORONARY ANGIOGRAPHY N/A 09/01/2018   Procedure: RIGHT/LEFT HEART CATH AND CORONARY ANGIOGRAPHY;  Surgeon: [name], MD;  Location: [place] CV LAB;  Service: Cardiovascular;  Laterality: N/A;   RIGHT/LEFT HEART CATH AND CORONARY ANGIOGRAPHY N/A 04/13/2019   Procedure: RIGHT/LEFT HEART CATH AND CORONARY ANGIOGRAPHY;  Surgeon: [name], MD;  Location: [place] CV LAB;  Service: Cardiovascular;  Laterality: N/A;   TOE AMPUTATION Right    "big toe"    There were no vitals filed for this visit.   Subjective Assessment - 09/20/20 1519     Subjective Pt spouse  present with pt at session. Pt spouse reports pt went to doctor and that the dose of his BP medication has been reduced to see if it helps with pt dizziness sx/BP drops. Pt has started this new dose as of Saturday. He reports he thinks it has helped somewhat. He reports his dizziness is currently an 8/10. He reports his doctor is going to continue to evaluate med list to determine if dizziness sx are being affected by other medications.    Patient is accompained by: Family member    Pertinent History Pt is an 87 y/o male presenting to physical therapy today with spouse with reports of difficulty with balance, dizziness, and walking. Pt states he started feeling "swimmy headed" approximately one year ago and that it is affecting his balance with walking and has been progressively worsening. Pt reports he feels he is moving when he's still. He denies spinning sensation. He reports he sometimes has headache but says "they  are not that bad." Pt reports feeling SOB with walking and household tasks (his doctor is aware). Pt denies unintentional weight gain or loss. Pt reports 1 fall the last 6 months. Per spouse pt tripped at time of fall and do not provide further details. Pt with extensive PMH. Relevant PMH per chart: Exertional dyspnea, centrilobular emphysema, angina, CAD, acute on chronic HFrEF, controlled DMII with diabetic nephropathy, diabetic neuropathy,  gout, CKD, essential HTN, paroxysmal atrial fibrillation, chronic anticoagulation, CKD, fatigue, iron deficiency anemia, pain of LE, OA of B knees, hx of partial ray amputation of R great toe, s/p cataract extraction status of eye, enthesopathy of ankle and tarsus, GERD, hyperlipidemia, hypothyroidism, nuclear sclerosis of L eye, psedophakia of R eye, ischemic cardiomyopathy, esophageal dysphagia, chronic heart failure with preserved ejection fraction, PSVT, coronary stent patent, bifascicular block, trifascicular block, wound infection, urge incontinence of  urine.    Limitations House hold activities;Lifting;Standing;Walking;Writing   Pt reports when he tries to write his hand shakes a lot   How long can you sit comfortably? not limited    How long can you stand comfortably? 5 minutes    How long can you walk comfortably? 10 minutes    Diagnostic tests DG Chest 2 View  from 07/06/2019 per chart: "  IMPRESSION:  No acute abnormalities."    Patient Stated Goals Feel more steady with walking    Currently in Pain? No/denies            TREATMENT-  BP positional assessment as pt reports recent change in medications: Seated LUE BP: 118/63 mmHg, HR 82 bpm Standing LUE BP: 95/57 mmHg, HR 94 bpm; pt reports feeling light-headed  PT instructs pt in seated therex as pt with BP drop and sx upon standing: 4# AWs on each LE-  Seated marches 2x21; pt rates medium LAQ 3x15; pt rates medium  STS 10x, 8x, 4x, close CGA.   Seated RTB hamstring curls - 3x15 BLEs; pt rates medium  Reassessment of standing BP (LUE) following therex: 93/55 mmHg, HR 100 bpm  Nustep level 2 x5 min, SPM maintained mostly in 80s. Pt rates medium (unbilled)  PT instructs pt in activity modification/performing only seated therex at home while he continues to have dizziness and significant changes in his BP, to take rest breaks, sit down if he were to feel dizzy when walking in his home. Pt and spouse verbalizes understanding.       PT Short Term Goals - 09/14/20 4709       PT SHORT TERM GOAL #1   Title Patient will be independent in home exercise program to improve strength/mobility for better functional independence with ADLs.    Baseline 06/06/2020: HEP to be initiated next visit; 5/11: Pt HEP to be updated/advanced; 6/29: not yet indep d/t prolonged absence from PT    Time 6    Period Weeks    Status On-going    Target Date 10/25/20               PT Long Term Goals - 09/14/20 6283       PT LONG TERM GOAL #1   Title Patient will increase FOTO score to  equal to or greater than 54 to demonstrate statistically significant improvement in mobility and quality of life.    Baseline 06/06/2020: 46 (risk-adjusted 45); 5/11 73%    Time 12    Period Weeks    Status Achieved      PT LONG TERM GOAL #2   Title  Patient will increase 10 meter walk test to >1.76ms as to improve gait speed for better community ambulation and to reduce fall risk.    Baseline 06/06/2020: 0.59 m/s with SPC; 5/11: 0.83 m/s SPC; 6/29: 0.67 m/s with SPC    Time 12    Period Weeks    Status On-going    Target Date 12/06/20      PT LONG TERM GOAL #3   Title Patient will increase Berg Balance score by > 6 points to demonstrate decreased fall risk during functional activities.    Baseline 06/06/2020: 40/56;  5/11: 46/56; 6/29: deferred d/t time    Time 12    Period Weeks    Status Partially Met    Target Date 12/06/20      PT LONG TERM GOAL #4   Title Patient (> 639years old) will complete five times sit to stand test in < 15 seconds indicating an increased LE strength and improved balance.    Baseline 06/06/2020: 18 sec performed with BUE assist off chair; 5/11: 12 seconds using BUEs    Time 12    Period Weeks    Status Achieved      PT LONG TERM GOAL #5   Title Patient will reduce dizziness handicap inventory score by at least 18 points, for less dizziness with ADLs and increased safety with home and work tasks.    Baseline 06/06/2020: 48% (moderate handicap); 5/11: 36%; 6/29: 58%    Time 12    Period Weeks    Status On-going    Target Date 12/06/20      PT LONG TERM GOAL #6   Title Patient will increase Functional Gait Assessment score to >20/30 as to reduce fall risk and improve dynamic gait safety with community ambulation.    Baseline 06/13/2020: 15/30 5/11: deferred this session due to time; 6/29: 14/30    Time 11    Period Weeks    Status On-going    Target Date 12/06/20              Plan - 09/20/20 1536     Clinical Impression Statement Pt limited in  type of therex performed secondary to pt fatigue and dizziness sx with BP drops (initial BPs: seated 118/63, standing 95/57). Pt asks for frequent rest breaks due to fatigue. All exercises performed sitting down except for STS, where PT monitored pt for symptoms and provided close CGA throughout. PT instructs pt in activity modification/performing only seated therex at home while he continues to have dizziness and significant changes in his BP, to take rest breaks, sit down if he were to feel dizzy when walking in his home, to continue to use his AD. Pt and spouse verbalize understanding. The pt will benefit from further skilled PT to improve strength and balance in order to decrease fall risk.    Personal Factors and Comorbidities Age;Comorbidity 3+;Fitness;Comorbidity 1;Comorbidity 2;Past/Current Experience;Time since onset of injury/illness/exacerbation    Comorbidities Pertinent PMH/comorbidities per chart: Exertional dyspnea, centrilobular emphysema, angina, CAD, acute on chronic HFrEF, controlled DMII with diabetic nephropathy, diabetic neuropathy, gout, CKD, essential HTN, paroxysmal atrial fibrillation, chronic anticoagulation, CKD, fatigue, iron deficiency anemia, pain of LE, OA of B knees, hx of partial ray amputation of R great toe, s/p cataract extraction status of eye, enthesopathy of ankle and tarsus, GERD, hyperlipidemia, hypothyroidism, nuclear sclerosis of L eye, psedophakia of R eye, ischemic cardiomyopathy, esophageal dysphagia, chronic heart failure with preserved ejection fraction, PSVT, coronary stent patent, bifascicular block, trifascicular block,  wound infection, urge incontinence of urine.    Examination-Activity Limitations Bathing;Bed Mobility;Bend;Caring for Others;Carry;Continence;Dressing;Hygiene/Grooming;Stairs;Squat;Reach Overhead;Locomotion Level;Lift;Stand;Toileting;Transfers    Examination-Participation Restrictions Medication Management;Cleaning;Meal Prep;Community  Activity;Driving;Laundry;Shop;Yard Work;Volunteer    Stability/Clinical Decision Making Evolving/Moderate complexity    Rehab Potential Good    PT Frequency 2x / week    PT Duration 12 weeks    PT Treatment/Interventions ADLs/Self Care Home Management;Biofeedback;Canalith Repostioning;Cryotherapy;Electrical Stimulation;Moist Heat;Traction;Ultrasound;DME Instruction;Gait training;Stair training;Functional mobility training;Therapeutic activities;Therapeutic exercise;Balance training;Neuromuscular re-education;Patient/family education;Orthotic Fit/Training;Manual techniques;Wheelchair mobility training;Passive range of motion;Dry needling;Energy conservation;Taping;Vestibular;Joint Manipulations;Spinal Manipulations;Aquatic Therapy    PT Next Visit Plan seated or standing therex as pt is able, balance    PT Home Exercise Plan no changes on this date    Consulted and Agree with Plan of Care Patient;Family member/caregiver    Family Member Consulted spouse             Patient will benefit from skilled therapeutic intervention in order to improve the following deficits and impairments:  Abnormal gait, Dizziness, Increased fascial restricitons, Impaired sensation, Improper body mechanics, Pain, Cardiopulmonary status limiting activity, Decreased coordination, Decreased mobility, Postural dysfunction, Decreased activity tolerance, Decreased endurance, Decreased range of motion, Hypomobility, Decreased strength, Decreased balance, Difficulty walking, Impaired flexibility  Visit Diagnosis: Muscle weakness (generalized)  Unsteadiness on feet  Other abnormalities of gait and mobility     Problem List Patient Active Problem List   Diagnosis Date Noted   Obstructive sleep apnea 06/29/2020   Urge incontinence of urine 12/22/2019   Trifascicular block 10/20/2019   Wound infection 10/08/2019   Bifascicular block 05/13/2019   Coronary stent patent 04/13/2019   Chronic HFrEF (heart failure with  reduced ejection fraction) ([place]) 03/19/2019   PSVT (paroxysmal supraventricular tachycardia) ([place]) 03/19/2019   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 ([place]) 08/24/2018   CKD (chronic kidney disease), stage IV ([place]) 06/04/2018   Osteoarthritis of knees, bilateral 06/02/2018   Hyperlipidemia associated with type 2 diabetes mellitus ([place]) 05/06/2018   Pain of lower extremity 03/06/2018   Duodenitis 01/30/2018   Chronic heart failure with preserved ejection fraction (HFpEF) ([place]) 01/23/2018   Hematochezia    Melena    Rectal bleeding 01/11/2018   AVM (arteriovenous malformation) of colon without hemorrhage    Schatzki's ring    Esophageal dysphagia    Diabetic retinopathy ([place]) 07/08/2017   Ischemic cardiomyopathy 06/26/2017   Cardiomyopathy ([place]) 06/26/2017   Centrilobular emphysema ([place]) 06/13/2017   Fatigue 12/18/2016   Stable angina ([place]) 09/20/2016   Stage 3a chronic kidney disease ([place]) 09/20/2016   Paroxysmal atrial fibrillation ([place]) 08/28/2016   Chronic anticoagulation 08/28/2016   Essential hypertension 08/20/2016   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 heart with stable angina pectoris ([place]) 08/19/2016   Accelerating angina ([place]) 08/15/2016   Exertional dyspnea 08/12/2016   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[place]) 12/29/2015   Gynecomastia, male 03/31/2014   Hypertrophy of breast 03/31/2014   Nuclear sclerosis of left eye 10/22/2013   Enthesopathy of ankle and tarsus 04/10/2013   Controlled type 2 diabetes mellitus with diabetic nephropathy ([place]) 04/09/2013   Diabetic neuropathy associated with type 2 diabetes mellitus ([place]) 04/09/2013   Cataract of left eye 11/25/2011   Corneal opacity 09/30/2011   Pseudophakia of right eye 09/30/2011   GERD (gastroesophageal reflux disease) 09/25/2011   Gout 09/25/2011   Hyperlipidemia LDL goal <70 09/25/2011   Status post cataract extraction 04/11/2011   Cataract extraction status of eye 04/11/2011   Iron deficiency  anemia 04/01/2011   Benign localized hyperplasia of  prostate without urinary obstruction and other lower urinary tract symptoms (LUTS) 12/10/2010   Enlarged prostate without lower urinary tract symptoms (luts) 12/10/2010   Colon cancer screening 10/11/2010   Hypothyroidism 10/11/2010   Hyperlipidemia 09/20/2003   Essential and other specified forms of tremor 01/05/2001   Essential tremor 01/05/2001   Ricard Dillon PT, DPT 09/20/2020, 3:44 PM  [place] MAIN [facility] SERVICES [address] [place], [place], [address] Phone: [phone]   Fax:  [phone]  Name: SANDON YOHO MRN: [id number] Date of Birth: November 19, 1933

## 2020-09-21 ENCOUNTER — Ambulatory Visit: Admit: 2020-09-21 | Payer: MEDICARE

## 2020-09-25 ENCOUNTER — Ambulatory Visit (INDEPENDENT_AMBULATORY_CARE_PROVIDER_SITE_OTHER): Payer: Medicare HMO | Admitting: Pharmacist

## 2020-09-25 ENCOUNTER — Other Ambulatory Visit: Payer: Self-pay | Admitting: Family Medicine

## 2020-09-25 DIAGNOSIS — I10 Essential (primary) hypertension: Secondary | ICD-10-CM

## 2020-09-25 DIAGNOSIS — I5032 Chronic diastolic (congestive) heart failure: Secondary | ICD-10-CM

## 2020-09-25 DIAGNOSIS — Z794 Long term (current) use of insulin: Secondary | ICD-10-CM

## 2020-09-25 NOTE — Chronic Care Management (AMB) (Signed)
Chronic Care Management Pharmacy Note  09/25/2020 Name:  Lucas Caldwell MRN:  [id number] DOB:  1934-01-29   Subjective: Lucas Caldwell is an 87 y.o. year old male who is a primary patient of [name], DO.  The CCM team was consulted for assistance with disease management and care coordination needs.    Engaged with patient by telephone for follow up visit in response to provider referral for pharmacy case management and/or care coordination services.   Consent to Services:  The patient was given information about Chronic Care Management services, agreed to services, and gave verbal consent prior to initiation of services.  Please see initial visit note for detailed documentation.   Patient Care Team: [name], DO as PCP - General (Family Medicine) End, [name], MD as PCP - Cardiology (Cardiology) Zoraya Fiorenza, [name], RPH-CPP as Pharmacist [name] [name], RN as Case Manager (General Practice)  Recent consult visits: Office Visit with [facility] on 6/27 Office Visit with [facility] on 6/29 and 7/6 Office Visit with [place] on 7/1 for c/o blood pressure issues  Hospital visits: Patient seen in [facility] ED on 6/1 for bronchitis  Objective:  Lab Results  Component Value Date   CREATININE 1.73 (H) 08/16/2020   CREATININE 1.55 (H) 06/29/2020   CREATININE 1.72 (H) 04/13/2020    Lab Results  Component Value Date   HGBA1C 7.1 (A) 06/14/2020   Last diabetic Eye exam:  Lab Results  Component Value Date/Time   HMDIABEYEEXA Retinopathy (A) 12/07/2019 12:00 AM    Last diabetic Foot exam: No results found for: HMDIABFOOTEX      Component Value Date/Time   CHOL 123 06/29/2020 1411   TRIG 170 (H) 06/29/2020 1411   HDL 29 (L) 06/29/2020 1411   CHOLHDL 4.2 06/29/2020 1411   CHOLHDL 3.9 08/28/2018 1107   VLDL 31 08/28/2018 1107   LDLCALC 65 06/29/2020 1411     Hepatic Function Latest Ref Rng & Units 08/16/2020 08/28/2018 04/22/2018  Total Protein 6.5 - 8.1 g/dL 7.7 8.0 7.2  Albumin 3.5 - 5.0 g/dL 3.8 3.7 4.0  AST 15 - 41 U/L '21 23 21  ' ALT 0 - 44 U/L '13 14 10  ' Alk Phosphatase 38 - 126 U/L 53 82 105  Total Bilirubin 0.3 - 1.2 mg/dL 0.8 0.5 <0.2  Bilirubin, Direct 0.0 - 0.2 mg/dL <0.1 0.1 -    Lab Results  Component Value Date/Time   TSH 3.850 05/04/2019 03:06 PM   TSH 1.971 12/18/2016 06:35 PM   FREET4 1.10 01/11/2018 02:05 PM    CBC Latest Ref Rng & Units 08/16/2020 06/29/2020 11/04/2019  WBC 4.0 - 10.5 K/uL 13.6(H) 7.0 6.8  Hemoglobin 13.0 - 17.0 g/dL 12.3(L) 13.2 11.9(L)  Hematocrit 39.0 - 52.0 % 37.3(L) 39.4 39.9  Platelets 150 - 400 K/uL 168 182 223    Social History   Tobacco Use  Smoking Status Former   Packs/day: 1.75   Years: 20.00   Pack years: 35.00   Types: Cigarettes   Start date: 64   Quit date: 1975   Years since quitting: 47.5  Smokeless Tobacco Former   BP Readings from Last 3 Encounters:  09/15/20 (!) 146/82  08/30/20 121/71  08/22/20 129/62   Pulse Readings from Last 3 Encounters:  09/15/20 77  08/30/20 81  08/22/20 78   Wt Readings from Last 3 Encounters:  09/15/20 194 lb (88 kg)  08/30/20 194 lb (88 kg)  08/22/20 193 lb (87.5 kg)    Assessment: Review of patient past medical history, allergies, medications, health status, including review of consultants reports, laboratory and other test data, was performed as part of comprehensive evaluation and provision of chronic care management services.   SDOH:  (Social Determinants of Health) assessments and interventions performed: none   CCM Care Plan  Allergies  Allergen Reactions   Clopidogrel Anaphylaxis and Other (See Comments)    altered mental status;  (electrolytes "out of wack" and confusion per family; THEY DO NOT REMEMBER ANY OTHER REACTION)- MSB 10/10/15   Ciprofloxacin Itching   Mirtazapine Diarrhea    Bad dreams    Spironolactone  Other (See Comments)    gynecomastia    Medications Reviewed Today     Reviewed by Vella Raring, RPH-CPP (Pharmacist) on 09/25/20 at 1157  Med List Status: <None>   Medication Order Taking? Sig Documenting Provider Last Dose Status Informant  acetaminophen (TYLENOL) 500 MG tablet [id number] Yes Take 1,000 mg by mouth every 6 (six) hours as needed for moderate pain or headache.  [provider] Taking Active Spouse/Significant Other  Alcohol Swabs (B-D SINGLE USE SWABS REGULAR) PADS [id number]  Use to check blood sugar up to 2 times daily [name], DO  Active   allopurinol (ZYLOPRIM) 100 MG tablet [id number] Yes Take 1 tablet (100 mg total) by mouth daily. Bensimhon, Shaune Pascal, MD Taking Active   aspirin 81 MG chewable tablet [id number] Yes Chew 81 mg by mouth daily. [provider] Taking Active Spouse/Significant Other  Blood Glucose Monitoring Suppl (TRUE METRIX METER) DEVI [id number]  Use to check blood sugar up to 2 times daily [name], DO  Active   bosutinib (BOSULIF) 100 MG tablet [id number] Yes Take 100 mg by mouth at bedtime. Take with food. [provider] Taking Active            Med Note Kenton Kingfisher, Germaine Pomfret Sep 15, 2019  8:38 AM)    carvedilol (COREG) 3.125 MG tablet [id number] Yes Take 1 tablet (3.125 mg total) by mouth 2 (two) times daily. [name] D, PA-C Taking Active   Cholecalciferol 25 MCG (1000 UT) capsule [id number] Yes Take 1,000 Units by mouth daily. [provider] Taking Active   EASY TOUCH PEN NEEDLES 31G X 8 MM MISC [id number]  USE 1 NEEDLE DAILY WITH LEVEMIR Karamalegos, [name], DO  Active   empagliflozin (JARDIANCE) 10 MG TABS tablet [id number] Yes Take 1 tablet (10 mg total) by mouth daily with breakfast. Please make an appointment for further refills Bensimhon, Shaune Pascal, MD Taking Active   ferrous sulfate 325 (65 FE) MG tablet [id number] No Take by mouth.  Patient not taking:  Reported on 09/25/2020   [provider] Not Taking Active   gabapentin (NEURONTIN) 300 MG capsule [id number] Yes TAKE TWO CAPSULES BY MOUTH TWICE DAILY MAX 4 CAPSULE PER DAY - FOR KIDNEY PROTECTION [name], DO Taking Active   hydrALAZINE (APRESOLINE) 25 MG tablet [id number] Yes Take 1 tablet (25 mg total) by mouth 3 (three) times daily. Theora Gianotti, NP Taking Active   Insulin Glargine [facility]) 100 UNIT/ML [id number] Yes Inject 20 Units into the skin daily. [provider] Taking Active   Insulin Pen Needle 31G X 5 MM MISC [id number]  31 g by Does not apply route as directed. [name], NP  Active Spouse/Significant Other  isosorbide mononitrate (IMDUR) 60 MG 24 hr tablet [id number] Yes  Take 1 tablet (60 mg total) by mouth daily. End, [name], MD Taking Active   levothyroxine (SYNTHROID) 25 MCG tablet [id number] Yes TAKE ONE TABLET BY MOUTH EVERY MORNING BEFORE BREAKFAST Karamalegos, [name], DO Taking Active   losartan (COZAAR) 25 MG tablet [id number] Yes Take 1 tablet (25 mg total) by mouth daily. Rise Mu, PA-C Taking Active   Menthol-Zinc Oxide (GOLD [place]) [id number] Yes Apply 1 application topically daily as needed (muscle pain). [provider] Taking Active Spouse/Significant Other  mirabegron ER (MYRBETRIQ) 25 MG TB24 tablet [id number] Yes Take 1 tablet (25 mg total) by mouth daily. From Urology Debroah Loop, PA-C Taking Active   Multiple Vitamin (MULTIVITAMIN WITH MINERALS) TABS tablet [id number] Yes Take 1 tablet by mouth daily. [provider] Taking Active Spouse/Significant Other  nitroGLYCERIN (NITROSTAT) 0.4 MG SL tablet [id number] No Place 1 tablet (0.4 mg total) under the tongue every 5 (five) minutes x 3 doses as needed for chest pain.  Patient not taking: Reported on 09/25/2020   Theora Gianotti, NP Not Taking Active   omeprazole (PRILOSEC) 20 MG capsule [id number] Yes Take  20 mg by mouth in the morning.  [provider] Taking Active Spouse/Significant Other  Polyethyl Glycol-Propyl Glycol (SYSTANE OP) [id number] Yes Place 1 drop into both eyes 3 (three) times daily as needed (dry/irritated eyes.).  [provider] Taking Active Spouse/Significant Other  polyethylene glycol (MIRALAX / GLYCOLAX) 17 g packet [id number] Yes Take 17 g by mouth daily as needed for mild constipation.  [provider] Taking Active Spouse/Significant Other  potassium chloride (KLOR-CON) 10 MEQ tablet [id number] Yes Take 1 tablet (10 mEq total) by mouth daily. [name], DO Taking Active   ranolazine (RANEXA) 500 MG 12 hr tablet [id number] Yes TAKE ONE TABLET BY MOUTH TWICE DAILY End, [name], MD Taking Active   rosuvastatin (CRESTOR) 5 MG tablet [id number] Yes TAKE ONE TABLET BY MOUTH ONCE DAILY End, [name], MD Taking Active   simethicone (MYLICON) 80 MG chewable tablet [id number] No Chew 80 mg by mouth every 6 (six) hours as needed for flatulence.  Patient not taking: Reported on 09/25/2020   [provider] Not Taking Active   tamsulosin (FLOMAX) 0.4 MG CAPS capsule [id number] Yes Take 1 capsule (0.4 mg total) by mouth daily after supper. Rise Mu, PA-C Taking Active   torsemide (DEMADEX) 20 MG tablet [id number] Yes Take 3 tablets (60 mg total) by mouth daily. Bensimhon, Shaune Pascal, MD Taking Active   traMADol (ULTRAM) 50 MG tablet [id number] Yes TAKE 1 OR 2 TABLETS BY MOUTH AT BEDTIME AS NEEDED [name], DO Taking Active   TRULICITY 1.5 UM/3.5TI SOPN [id number] Yes Inject 1.5 mg into the skin once a week. [name], DO Taking Active            Med Note [facility], BRANDY L   Thu Jun 29, 2020  1:45 PM)    warfarin (COUMADIN) 3 MG tablet [id number]  TAKE 1 TO 1 & 1/2 TABLETS BY MOUTH AS DIRECTED BY THE COUMADIN CLINIC End, [name], MD  Active             Patient Active Problem List   Diagnosis  Date Noted   Obstructive sleep apnea 06/29/2020   Urge incontinence of urine 12/22/2019   Trifascicular block 10/20/2019   Wound infection 10/08/2019   Bifascicular block 05/13/2019   Coronary stent patent 04/13/2019   Chronic HFrEF (heart failure with reduced ejection fraction) ([place]) 03/19/2019  PSVT (paroxysmal supraventricular tachycardia) ([place]) 03/19/2019   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 ([place]) 08/24/2018   CKD (chronic kidney disease), stage IV ([place]) 06/04/2018   Osteoarthritis of knees, bilateral 06/02/2018   Hyperlipidemia associated with type 2 diabetes mellitus ([place]) 05/06/2018   Pain of lower extremity 03/06/2018   Duodenitis 01/30/2018   Chronic heart failure with preserved ejection fraction (HFpEF) ([place]) 01/23/2018   Hematochezia    Melena    Rectal bleeding 01/11/2018   AVM (arteriovenous malformation) of colon without hemorrhage    Schatzki's ring    Esophageal dysphagia    Diabetic retinopathy ([place]) 07/08/2017   Ischemic cardiomyopathy 06/26/2017   Cardiomyopathy ([place]) 06/26/2017   Centrilobular emphysema ([place]) 06/13/2017   Fatigue 12/18/2016   Stable angina (HCC) 09/20/2016   Stage 3a chronic kidney disease ([place]) 09/20/2016   Paroxysmal atrial fibrillation ([place]) 08/28/2016   Chronic anticoagulation 08/28/2016   Essential hypertension 08/20/2016   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 heart with stable angina pectoris ([place]) 08/19/2016   Accelerating angina ([place]) 08/15/2016   Exertional dyspnea 08/12/2016   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   Gynecomastia, male 03/31/2014   Hypertrophy of breast 03/31/2014   Nuclear sclerosis of left eye 10/22/2013   Enthesopathy of ankle and tarsus 04/10/2013   Controlled type 2 diabetes mellitus with diabetic nephropathy ([place]) 04/09/2013   Diabetic neuropathy associated with type 2 diabetes mellitus ([place]) 04/09/2013   Cataract of left eye 11/25/2011   Corneal opacity 09/30/2011    Pseudophakia of right eye 09/30/2011   GERD (gastroesophageal reflux disease) 09/25/2011   Gout 09/25/2011   Hyperlipidemia LDL goal <70 09/25/2011   Status post cataract extraction 04/11/2011   Cataract extraction status of eye 04/11/2011   Iron deficiency anemia 04/01/2011   Benign localized hyperplasia of prostate without urinary obstruction and other lower urinary tract symptoms (LUTS) 12/10/2010   Enlarged prostate without lower urinary tract symptoms (luts) 12/10/2010   Colon cancer screening 10/11/2010   Hypothyroidism 10/11/2010   Hyperlipidemia 09/20/2003   Essential and other specified forms of tremor 01/05/2001   Essential tremor 01/05/2001    Immunization History  Administered Date(s) Administered   Influenza, High Dose Seasonal PF 12/29/2014, 12/19/2016, 12/22/2017   Influenza, Seasonal, Injecte, Preservative Fre 01/13/2007, 12/13/2008, 01/30/2010, 12/26/2011   Influenza,inj,Quad PF,6+ Mos 12/25/2012, 12/09/2015, 11/26/2018, 12/15/2019   PFIZER(Purple Top)SARS-COV-2 Vaccination 05/13/2019, 06/08/2019, 12/15/2019, 05/19/2020   PPD Test 09/25/2010   Pneumococcal Conjugate-13 01/31/2014   Pneumococcal Polysaccharide-23 06/20/2008, 09/02/2010    Conditions to be addressed/monitored: T2DM, CKD, HF, HLD, atrial fibrillation, OSA, HTN, hypothyroidism  Care Plan : PharmD - Medication Management/Assistance  Updates made by Vella Raring, RPH-CPP since 09/25/2020 12:00 AM     Problem: Disease Progression      Long-Range Goal: Disease Progression Prevented or Minimized   Start Date: 04/14/2020  Expected End Date: 07/13/2020  This Visit's Progress: On track  Recent Progress: On track  Priority: High  Note:   Current Barriers:  Chronic Disease Management support, education, and care coordination needs related to type 2 diabetes, CKD, HFrEF s/p CardioMEMS, hyperlipidemia, atrial fibrillation, hypertension, coronary artery disease, CHF, hypothyroidism, and  CML Financial Note patient has Partial Extra Help/LIS Subsidy Patient enrolled in [employer] health practitioner and [place]) patient assistance program for 2022 calendar year Patient denied for patient assistance for [place] from [employer] for 2022 calendar year Patient enrolled in [place] for [place] copayment assistance (eligibility through 08/28/2021) Limited vision  Pharmacist Clinical Goal(s):  Over the next 90 days, patient will verbalize ability to afford treatment regimen through collaboration with PharmD and provider.   Interventions: 1:1 collaboration with [name], DO regarding development and update of comprehensive plan of care as evidenced by provider attestation and co-signature Inter-disciplinary care team collaboration (see longitudinal plan of care) Perform chart review Patient seen for Office Visit with [facility] on 6/27 [facility] Visit with [facility] on 6/29 and 7/6 [facility] on [facility]1 for c/o blood pressure issues. Provider advised patient  Per patient request, trial off metoprolol. Start Coreg 3.157m BID in its place Today spouse confirms patient stopped metoprolol and started carvedilol 3.1212mBID as directed by Cardiologist Reports thinks patient may have had some improvement, but continues to have nightmares Reports patient still having dizziness, but improved with taking positional changes slowly Reiterate encouraging patient to use cane consistently Denies monitoring patient's home BP Encourage to restart monitoring home BP, keep log of results and bring record with patient to upcoming appointment with Dr. EnSaunders Reveln 7/20 Counsel on BP monitoring technique Reports patient remains adherent to scans required for CardioMEMS monitoring Comprehensive medication review performed; medication list updated in electronic medical record Reports patient not taking  ferrous sulfate recently. Reports due to pill burden for patient, wants to talk to Cardiologist at upcoming appointment about whether patient could hold iron supplement  Type 2 Diabetes Controlled; current treatment: [name] 20 units daily Trulicity 1.5 mg once weekly Jardiance 10 mg once daily in morning Patient reports recent AM CBGs readings ranging 100-138 Denies episodse of hypoglycemia Encourage patient to have regular well-balanced meals   Patient Goals/Self-Care Activities Over the next 90 days, patient will:  Check blood sugar, blood pressure and fluid status as directed by providers  Take medications, with assistance of wife, as directed Mrs. Taddei manages patient's medications using weekly pillbox as adherence tool Attends scheduled medical appointments  Follow Up Plan: Telephone follow up appointment with care management team member scheduled for: 8/17 at 10:30 am      Patient's preferred pharmacy is:  [place]: [phone]: [address]hone: [phone]: [phone] Eagleail Delivery (Now [facility]) - [address]one: [phone]: [phone] by [facility] [place] [address]hone: [phone]: [phone] pill box? Yes  Follow Up:  Patient agrees to Care Plan and Follow-up.  ElHarlow AsaPharmD, BCPara MarchCPP Clinical Pharmacist SoAdventhealth Daytona [phone]

## 2020-09-25 NOTE — Patient Instructions (Signed)
Visit Information  PATIENT GOALS:  Goals Addressed             This Visit's Progress    Pharmacy Goals       Please check your blood sugar, keep a log of the results and bring this with you to your medical appointments.  Please check your home blood pressure, keep a log of the results and bring this with you to your medical appointments.  Our goal bad cholesterol, or LDL, is less than 70 . This is why it is important to continue taking your rosuvastatin.  Feel free to call me with any questions or concerns. I look forward to our next call!   [name], PharmD, [place] [phone]         The patient verbalized understanding of instructions, educational materials, and care plan provided today and declined offer to receive copy of patient instructions, educational materials, and care plan.   Telephone follow up appointment with care management team member scheduled for: 8/17 at 10:30 am

## 2020-09-27 ENCOUNTER — Ambulatory Visit: Payer: Medicare HMO

## 2020-09-27 ENCOUNTER — Other Ambulatory Visit: Payer: Self-pay

## 2020-09-27 DIAGNOSIS — M6281 Muscle weakness (generalized): Secondary | ICD-10-CM

## 2020-09-27 DIAGNOSIS — R2681 Unsteadiness on feet: Secondary | ICD-10-CM | POA: Diagnosis not present

## 2020-09-27 DIAGNOSIS — R2689 Other abnormalities of gait and mobility: Secondary | ICD-10-CM

## 2020-09-27 NOTE — Therapy (Signed)
[place] MAIN [facility] SERVICES [address] [place], [place], [address] Phone: [phone]   Fax:  [phone]  Physical Therapy Treatment  Patient Details  Name: Lucas Caldwell MRN: [id number] Date of Birth: [birth year] Referring Provider (PT): [name], DO   Encounter Date: 09/27/2020   PT End of Session - 09/27/20 1105     Visit Number 17    Number of Visits 49    Date for PT Re-Evaluation 12/06/20    Authorization Time Period eval performed on 06/06/2020    PT Start Time 1017    PT Stop Time 1101    PT Time Calculation (min) 44 min    Equipment Utilized During Treatment Gait belt    Activity Tolerance Patient tolerated treatment well    Behavior During Therapy [facility] for tasks assessed/performed             Past Medical History:  Diagnosis Date   Arthritis    "probably in his legs" (08/15/2016)   Blind right eye    BPH (benign prostatic hyperplasia)    Breast asymmetry    Left breast is larger, present for several years.   CAD (coronary artery disease)    a. Cath in the late 90's - reportedly ok;  b. 2014 s/p stenting x 2 @ [facility]; c 08/19/16 Cath/PCI with DES -> RCA, plan to treat LM 70% medically. Seen by surgery and felt to be too high risk for CABG; d. 08/2018 Cath: LM 70 (iFR 0.86), LAD 20ost, 40p, 50/6m D1 70, LCX patent stent, OM1 30, RCA patent stent, 436mSR, 40d, RPDA 30. PCWP 8. CO/CI 4.0/2.1; e. 03/2019 PCI to LAD (2.5x15 Resolute Onyx DES).   Chronic combined systolic (congestive) and diastolic (congestive) heart failure ([place]   a. Previously reduced EF-->50% by echo in 2012;  b. 06/2015 Echo: EF 50-55%  c. 07/2016 Echo: EF 45-50%; d. 12/2017 Echo: EF 55%, Gr1 DD, mild MR; e. 08/2018 Echo: EF 35-40%, mildly dil PA; f. 04/2019 Echo: EF 30-35%, glob HK, nl RV fxn; g. s/p Cardiomems.   Chronic kidney disease (CKD), stage IV (severe) (HCC)    CML (chronic myelocytic leukemia) (HCC)    GERD (gastroesophageal reflux  disease)    GIB (gastrointestinal bleeding)    a. 12/2017 3 unit PRBC GIB in setting of coumadin-->Endo/colon multiple duodenal ulcers and a single bleeding ulcer in the proximal ascending colon status post hemostatic clipping x2.   Gout    Hyperlipemia    Hypertension    Hypothyroidism    Iron deficiency anemia    Ischemic cardiomyopathy    a. Previously reduced EF-->50% by echo in 2012;  b. 06/2015 Echo: EF 50-55%, Gr2 DD, mild MR, mildly dil LA, Ao sclerosis, mild TR;  c. 07/2016 Echo: EF 45-50%; d. 12/2017 Echo: EF 55%, Gr1 DD, mild MR; e. 08/2018 Echo: EF 35-40%; f. 04/2019 Echo: EF 30-35%.   Migraine    "in the 1960s" (08/15/2016)   Obstructive sleep apnea    PAF (paroxysmal atrial fibrillation) (HCC)    a. ? Dx 2014-->s/p DCCV;  b. CHA2DS2VASc = 6--> Coumadin.   Prostate cancer ([place]   RBBB    Type II diabetes mellitus ([facility]    Past Surgical History:  Procedure Laterality Date   CATARACT EXTRACTION W/ INTRAOCULAR LENS  IMPLANT, BILATERAL Bilateral    COLONOSCOPY N/A 01/13/2018   Procedure: COLONOSCOPY;  Surgeon: VaLin LandsmanMD;  Location: [facility] ENDOSCOPY;  Service: Gastroenterology;  Laterality: N/A;   COLONOSCOPY WITH PROPOFOL N/A 01/01/2018   Procedure: COLONOSCOPY WITH PROPOFOL;  Surgeon: Lin Landsman, MD;  Location: [facility] ENDOSCOPY;  Service: Gastroenterology;  Laterality: N/A;   CORONARY ANGIOPLASTY WITH STENT PLACEMENT  09/2012   2 stents   CORONARY STENT INTERVENTION N/A 08/19/2016   Procedure: Coronary Stent Intervention;  Surgeon: [name], MD;  Location: [place] CV LAB;  Service: Cardiovascular;  Laterality: N/A;   CORONARY STENT INTERVENTION N/A 04/13/2019   Procedure: CORONARY STENT INTERVENTION;  Surgeon: [name], MD;  Location: [place] CV LAB;  Service: Cardiovascular;  Laterality: N/A;   ESOPHAGEAL MANOMETRY N/A 12/16/2017   Procedure: ESOPHAGEAL MANOMETRY (EM);  Surgeon: Lin Landsman, MD;  Location: [facility] ENDOSCOPY;   Service: Gastroenterology;  Laterality: N/A;   ESOPHAGOGASTRODUODENOSCOPY N/A 12/20/2016   Procedure: ESOPHAGOGASTRODUODENOSCOPY (EGD);  Surgeon: Lin Landsman, MD;  Location: [facility] ENDOSCOPY;  Service: Gastroenterology;  Laterality: N/A;   ESOPHAGOGASTRODUODENOSCOPY N/A 01/13/2018   Procedure: ESOPHAGOGASTRODUODENOSCOPY (EGD);  Surgeon: Lin Landsman, MD;  Location: [facility] ENDOSCOPY;  Service: Gastroenterology;  Laterality: N/A;   ESOPHAGOGASTRODUODENOSCOPY (EGD) WITH PROPOFOL N/A 11/03/2017   Procedure: ESOPHAGOGASTRODUODENOSCOPY (EGD) WITH PROPOFOL;  Surgeon: Lin Landsman, MD;  Location[facility] ENDOSCOPY;  Service: Gastroenterology;  Laterality: N/A;   EYE SURGERY     HERNIA REPAIR     "navel"   LEFT HEART CATH AND CORONARY ANGIOGRAPHY N/A 08/14/2016   Procedure: Left Heart Cath and Coronary Angiography;  Surgeon: [name], MD;  Location: [place] CV LAB;  Service: Cardiovascular;  Laterality: N/A;   PRESSURE SENSOR/CARDIOMEMS N/A 09/23/2019   Procedure: PRESSURE SENSOR/CARDIOMEMS;  Surgeon: Jolaine Artist, MD;  Location: [place] CV LAB;  Service: Cardiovascular;  Laterality: N/A;   RIGHT HEART CATH N/A 09/23/2019   Procedure: RIGHT HEART CATH;  Surgeon: Jolaine Artist, MD;  Location: [place] CV LAB;  Service: Cardiovascular;  Laterality: N/A;   RIGHT/LEFT HEART CATH AND CORONARY ANGIOGRAPHY N/A 09/01/2018   Procedure: RIGHT/LEFT HEART CATH AND CORONARY ANGIOGRAPHY;  Surgeon: [name], MD;  Location: [place] CV LAB;  Service: Cardiovascular;  Laterality: N/A;   RIGHT/LEFT HEART CATH AND CORONARY ANGIOGRAPHY N/A 04/13/2019   Procedure: RIGHT/LEFT HEART CATH AND CORONARY ANGIOGRAPHY;  Surgeon: [name], MD;  Location: [place] CV LAB;  Service: Cardiovascular;  Laterality: N/A;   TOE AMPUTATION Right    "big toe"    There were no vitals filed for this visit.   Subjective Assessment - 09/27/20 1019     Subjective Pt reports he  is tired today. Pt reports no dizziness currently but that it has remained. Pt reports no pain. Pt reports he feels his "breathing has been pretty good." [name] waiting for CPAP to come in. Pt says he fell on Monday getting out of his bed, stating, "my R foot just gave way." Pt says he did not hit is head. He says he came down on his R side but that he has no pain and does not believe he injured himself.    Patient is accompained by: Family member    Pertinent History Pt is an 87 y/o male presenting to physical therapy today with spouse with reports of difficulty with balance, dizziness, and walking. Pt states he started feeling "swimmy headed" approximately one year ago and that it is affecting his balance with walking and has been progressively worsening. Pt reports he feels he is moving when he's still. He denies spinning sensation. He reports he sometimes has headache  but says "they are not that bad." Pt reports feeling SOB with walking and household tasks (his doctor is aware). Pt denies unintentional weight gain or loss. Pt reports 1 fall the last 6 months. Per spouse pt tripped at time of fall and do not provide further details. Pt with extensive PMH. 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    Limitations House hold  activities;Lifting;Standing;Walking;Writing   Pt reports when he tries to write his hand shakes a lot   How long can you sit comfortably? not limited    How long can you stand comfortably? 5 minutes    How long can you walk comfortably? 10 minutes    Diagnostic tests DG Chest 2 View  from 07/06/2019 per chart: "  IMPRESSION:  No acute abnormalities."    Patient Stated Goals Feel more steady with walking    Currently in Pain? No/denies                TREATMENT-  Continued BP positional assessment as pt reports recent change in medications: Seated RUE BP: 118/62 mmHg, HR 83 bpm Standing RUE BP: 103/60 mmHg, HR 86 bpm; pt reports feeling "just a little bit" when asked if light-headed  Nustep for cardioresp. And LE mm endurance - -level 0 x 3 min --increased to level 2 for 5 min Pt rates medium SPM maintained in 60s-70s Cuing for cool-down HR post-nustep 98-101 bpm, SPO2% 98% CGA for mount/dismount.  Monitoring throughout for exercise response. SOB. Pt report no SOB but slight dizziness. When asked he does not think dizziness is increased from baseline.   Leg press  25# 10x BLEs 25# 5-8x individual LE with PT-assist  PT provides hands-on assist throughout and VC/TC for technique. Pt struggles with technique.   Neuro Re-education: CGA provided for all At support surface- Standing NBOS 60 sec Standing NBOS EC 60 sec  Split stance with one foot on floor and one on airex pad 60 sec BLEs  Single leg toe taps onto 6" step with decreasing UE support (requires at least 3-finger support) 20x reps.    PT Short Term Goals - 09/14/20 8502       PT SHORT TERM GOAL #1   Title Patient will be independent in home exercise program to improve strength/mobility for better functional independence with ADLs.    Baseline 06/06/2020: HEP to be initiated next visit; 5/11: Pt HEP to be updated/advanced; 6/29: not yet indep d/t prolonged absence from PT    Time 6    Period Weeks    Status  On-going    Target Date 10/25/20               PT Long Term Goals - 09/14/20 7741       PT LONG TERM GOAL #1   Title Patient will increase FOTO score to equal to or greater than 54 to demonstrate statistically significant improvement in mobility and quality of life.    Baseline  06/06/2020: 46 (risk-adjusted 45); 5/11 73%    Time 12    Period Weeks    Status Achieved      PT LONG TERM GOAL #2   Title Patient will increase 10 meter walk test to >1.38ms as to improve gait speed for better community ambulation and to reduce fall risk.    Baseline 06/06/2020: 0.59 m/s with SPC; 5/11: 0.83 m/s SPC; 6/29: 0.67 m/s with SPC    Time 12    Period Weeks    Status On-going    Target Date 12/06/20      PT LONG TERM GOAL #3   Title Patient will increase Berg Balance score by > 6 points to demonstrate decreased fall risk during functional activities.    Baseline 06/06/2020: 40/56;  5/11: 46/56; 6/29: deferred d/t time    Time 12    Period Weeks    Status Partially Met    Target Date 12/06/20      PT LONG TERM GOAL #4   Title Patient (> 666years old) will complete five times sit to stand test in < 15 seconds indicating an increased LE strength and improved balance.    Baseline 06/06/2020: 18 sec performed with BUE assist off chair; 5/11: 12 seconds using BUEs    Time 12    Period Weeks    Status Achieved      PT LONG TERM GOAL #5   Title Patient will reduce dizziness handicap inventory score by at least 18 points, for less dizziness with ADLs and increased safety with home and work tasks.    Baseline 06/06/2020: 48% (moderate handicap); 5/11: 36%; 6/29: 58%    Time 12    Period Weeks    Status On-going    Target Date 12/06/20      PT LONG TERM GOAL #6   Title Patient will increase Functional Gait Assessment score to >20/30 as to reduce fall risk and improve dynamic gait safety with community ambulation.    Baseline 06/13/2020: 15/30 5/11: deferred this session due to time; 6/29: 14/30     Time 11    Period Weeks    Status On-going    Target Date 12/06/20                   Plan - 09/27/20 1044     Clinical Impression Statement Pt shows improved static balance with requiring no UE support with majority of balance exercises. He does require UE support for single leg toe taps. He requires frequent rest breaks. [name] most challenged with endurance. The pt will benefit from further skilled PT to improve strength, endurance and balance to decrease fall risk and improve QOL    Personal Factors and Comorbidities Age;Comorbidity 3+;Fitness;Comorbidity 1;Comorbidity 2;Past/Current Experience;Time since onset of injury/illness/exacerbation    Comorbidities Pertinent PMH/comorbidities per chart: Exertional dyspnea, centrilobular emphysema, angina, CAD, acute on chronic HFrEF, controlled DMII with diabetic nephropathy, diabetic neuropathy, gout, CKD, essential HTN, paroxysmal atrial fibrillation, chronic anticoagulation, CKD, fatigue, iron deficiency anemia, pain of LE, OA of B knees, hx of partial ray amputation of R great toe, s/p cataract extraction status of eye, enthesopathy of ankle and tarsus, GERD, hyperlipidemia, hypothyroidism, nuclear sclerosis of L eye, psedophakia of R eye, ischemic cardiomyopathy, esophageal dysphagia, chronic heart failure with preserved ejection fraction, PSVT, coronary stent patent, bifascicular block, trifascicular block, wound infection, urge incontinence of urine.    Examination-Activity Limitations Bathing;Bed Mobility;Bend;Caring for Others;Carry;Continence;Dressing;Hygiene/Grooming;Stairs;Squat;Reach Overhead;Locomotion Level;Lift;Stand;Toileting;Transfers    Examination-Participation Restrictions Medication Management;Cleaning;Meal Prep;Community  Activity;Driving;Laundry;Shop;Yard Work;Volunteer    Stability/Clinical Decision Making Evolving/Moderate complexity    Rehab Potential Good    PT Frequency 2x / week    PT Duration 12 weeks    PT  Treatment/Interventions ADLs/Self Care Home Management;Biofeedback;Canalith Repostioning;Cryotherapy;Electrical Stimulation;Moist Heat;Traction;Ultrasound;DME Instruction;Gait training;Stair training;Functional mobility training;Therapeutic activities;Therapeutic exercise;Balance training;Neuromuscular re-education;Patient/family education;Orthotic Fit/Training;Manual techniques;Wheelchair mobility training;Passive range of motion;Dry needling;Energy conservation;Taping;Vestibular;Joint Manipulations;Spinal Manipulations;Aquatic Therapy    PT Next Visit Plan seated or standing therex as pt is able, balance    PT Home Exercise Plan no changes on this date    Consulted and Agree with Plan of Care Patient;Family member/caregiver    Family Member Consulted spouse             Patient will benefit from skilled therapeutic intervention in order to improve the following deficits and impairments:  Abnormal gait, Dizziness, Increased fascial restricitons, Impaired sensation, Improper body mechanics, Pain, Cardiopulmonary status limiting activity, Decreased coordination, Decreased mobility, Postural dysfunction, Decreased activity tolerance, Decreased endurance, Decreased range of motion, Hypomobility, Decreased strength, Decreased balance, Difficulty walking, Impaired flexibility  Visit Diagnosis: Other abnormalities of gait and mobility  Muscle weakness (generalized)  Unsteadiness on feet     Problem List Patient Active Problem List   Diagnosis Date Noted   Obstructive sleep apnea 06/29/2020   Urge incontinence of urine 12/22/2019   Trifascicular block 10/20/2019   Wound infection 10/08/2019   Bifascicular block 05/13/2019   Coronary stent patent 04/13/2019   Chronic HFrEF (heart failure with reduced ejection fraction) ([place]) 03/19/2019   PSVT (paroxysmal supraventricular tachycardia) ([place]) 03/19/2019   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 ([place]) 08/24/2018   CKD (chronic  kidney disease), stage IV ([place]) 06/04/2018   Osteoarthritis of knees, bilateral 06/02/2018   Hyperlipidemia associated with type 2 diabetes mellitus ([place]) 05/06/2018   Pain of lower extremity 03/06/2018   Duodenitis 01/30/2018   Chronic heart failure with preserved ejection fraction (HFpEF) ([place]) 01/23/2018   Hematochezia    Melena    Rectal bleeding 01/11/2018   AVM (arteriovenous malformation) of colon without hemorrhage    Schatzki's ring    Esophageal dysphagia    Diabetic retinopathy ([place]) 07/08/2017   Ischemic cardiomyopathy 06/26/2017   Cardiomyopathy ([place]) 06/26/2017   Centrilobular emphysema ([place]) 06/13/2017   Fatigue 12/18/2016   Stable angina ([place]) 09/20/2016   Stage 3a chronic kidney disease ([place]) 09/20/2016   Paroxysmal atrial fibrillation ([place]) 08/28/2016   Chronic anticoagulation 08/28/2016   Essential hypertension 08/20/2016   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 heart with stable angina pectoris ([place]) 08/19/2016   Accelerating angina ([place]) 08/15/2016   Exertional dyspnea 08/12/2016   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[place]) 12/29/2015   Gynecomastia, male 03/31/2014   Hypertrophy of breast 03/31/2014   Nuclear sclerosis of left eye 10/22/2013   Enthesopathy of ankle and tarsus 04/10/2013   Controlled type 2 diabetes mellitus with diabetic nephropathy ([name]) 04/09/2013   Diabetic neuropathy associated with type 2 diabetes mellitus ([place]) 04/09/2013   Cataract of left eye 11/25/2011   Corneal opacity 09/30/2011   Pseudophakia of right eye 09/30/2011   GERD (gastroesophageal reflux disease) 09/25/2011   Gout 09/25/2011   Hyperlipidemia LDL goal <70 09/25/2011   Status post cataract extraction 04/11/2011   Cataract extraction status of eye 04/11/2011   Iron deficiency anemia 04/01/2011   Benign localized hyperplasia of prostate without urinary obstruction and other lower urinary tract symptoms (LUTS) 12/10/2010   Enlarged prostate without lower urinary tract symptoms  (luts) 12/10/2010  Colon cancer screening 10/11/2010   Hypothyroidism 10/11/2010   Hyperlipidemia 09/20/2003   Essential and other specified forms of tremor 01/05/2001   Essential tremor 01/05/2001   Ricard Dillon PT, DPT  09/27/2020, 11:10 AM  [place] MAIN [facility] SERVICES [address] [place], [place], [address] Phone: [phone]   Fax:  [phone]  Name: ISAI GOTTLIEB MRN: [id number] Date of Birth: 1933/11/01

## 2020-09-29 ENCOUNTER — Telehealth: Payer: Self-pay

## 2020-09-29 NOTE — Telephone Encounter (Signed)
Unfortunately while oxybutynin would be less expensive for the patient and his insurer, the class of medications to which oxybutynin belongs is contraindicated for patients over the age of 25. In addition to the common side effects of constipation, dry mouth, and dry eye, the long-term use of these medications has been shown to increase the risk for cognitive impairment and dementia.  Myrbetriq has no age-based contraindication and does not carry the cognitive concerns of the oxybutynin-type medications. It would be a far safer option for him to use in the long term, which is why I prescribed it in the first place. It is a shame that his insurer does not share the same concerns for his long-term wellbeing.  If the copay for Myrbetriq is cost prohibitive to him, we could try trospium as an alternative. This is another member of the oxybutynin family of drugs, but it is less likely to contribute to cognitive impairment. It is still likely to cause constipation, dry mouth, and dry eye. Unfortunately, it does not appear that [facility] covers this drug and so I cannot guarantee that it would be much cheaper than the Myrbetriq. I would be willing to prescribe this for him if he would like.

## 2020-09-29 NOTE — Telephone Encounter (Signed)
[name] called wanting to know if there was a less expensive medication he could be started on for his bladder symptoms. Form faxed from [facility] requesting oxybutinin switch, $2 copay. It was explained due to patient's age and high risk of side effects this would be contraindicated. [name] states that the Myrbetriq is $45 copay a mont and wants to know if there is another medication less expensive that would be agreeable

## 2020-09-29 NOTE — Telephone Encounter (Signed)
LMOM letting patient know trospium could be an alternative however it may end up being more expensive than the myrbetriq. Also included in the message that Sam would not be prescribing oxybutynin because of the contraindications. Advised patient to call office back next week if he decides to try the trospium.

## 2020-10-02 ENCOUNTER — Telehealth: Payer: Medicare HMO | Admitting: General Practice

## 2020-10-02 ENCOUNTER — Ambulatory Visit: Payer: Self-pay | Admitting: General Practice

## 2020-10-02 DIAGNOSIS — I5032 Chronic diastolic (congestive) heart failure: Secondary | ICD-10-CM

## 2020-10-02 DIAGNOSIS — I10 Essential (primary) hypertension: Secondary | ICD-10-CM

## 2020-10-02 DIAGNOSIS — R0602 Shortness of breath: Secondary | ICD-10-CM

## 2020-10-02 DIAGNOSIS — E1165 Type 2 diabetes mellitus with hyperglycemia: Secondary | ICD-10-CM

## 2020-10-02 DIAGNOSIS — J432 Centrilobular emphysema: Secondary | ICD-10-CM

## 2020-10-02 DIAGNOSIS — Z794 Long term (current) use of insulin: Secondary | ICD-10-CM

## 2020-10-02 DIAGNOSIS — E1121 Type 2 diabetes mellitus with diabetic nephropathy: Secondary | ICD-10-CM | POA: Diagnosis not present

## 2020-10-02 DIAGNOSIS — R5383 Other fatigue: Secondary | ICD-10-CM

## 2020-10-02 DIAGNOSIS — IMO0002 Reserved for concepts with insufficient information to code with codable children: Secondary | ICD-10-CM

## 2020-10-02 DIAGNOSIS — R29818 Other symptoms and signs involving the nervous system: Secondary | ICD-10-CM

## 2020-10-02 NOTE — Patient Instructions (Signed)
Visit Information  PATIENT GOALS:  Goals Addressed             This Visit's Progress    RNCM: Manage Fatigue (Tiredness-COPD)       Follow Up Date 12/11/2020    - develop a new routine to improve sleep - don't eat or exercise right before bedtime - eat healthy - get at least 7 to 8 hours of sleep at night - get outdoors every day (weather permitting) - keep room cool and dark - limit daytime naps - practice relaxation or meditation daily - use a fan or white noise in bedroom - use devices that will help like a cane, sock-puller or reacher    Why is this important?   Feeling tired or worn out is a common symptom of COPD (chronic obstructive pulmonary disease).  Learning when you feel your best and when you need rest is important.  Managing the tiredness (fatigue) will help you be active and enjoy life.     Notes: Per the patients wife he dreams a lot at night and this causes him not to sleep well. Also it has been determined that he has sleep apnea and would benefit from CPAP. The wife is checking into this for the patient. The patient has to pace activities and says he easily gets short of breath just by putting on his pants. Education on taking rest breaks and pacing activity.      RNCM: Track and Manage My Triggers-COPD       Timeframe:  Long-Range Goal Priority:  High Start Date:    10-02-2020                         Expected End Date:      10-02-2021                 Follow Up Date 12/11/2020    - avoid second hand smoke - eliminate smoking in my home - identify and avoid work-related triggers - identify and remove indoor air pollutants - limit outdoor activity during cold weather - listen for public air quality announcements every day    Why is this important?   Triggers are activities or things, like tobacco smoke or cold weather, that make your COPD (chronic obstructive pulmonary disease) flare-up.  Knowing these triggers helps you plan how to stay away from them.   When you cannot remove them, you can learn how to manage them.     Notes: 10-02-2020: The patient is a former smoker. Has complex medical conditions that impact his breathing. Easily gets short of breath. Denies any acute distress. Had a sleep apnea test and needs a cpap. Has been ordered since May and the wife states she called Friday and they still don't have it. Reminded the patient to call every week for updates on device.         The patient verbalized understanding of instructions, educational materials, and care plan provided today and declined offer to receive copy of patient instructions, educational materials, and care plan.   Telephone follow up appointment with care management team member scheduled for: 12-11-2020 at 43 am  Noreene Larsson RN, MSN, [place] Mobile: [phone]

## 2020-10-02 NOTE — Chronic Care Management (AMB) (Signed)
Chronic Care Management   CCM RN Visit Note  10/02/2020 Name: Lucas Caldwell MRN: [id number] DOB: 05-Jun-1933  Subjective: Lucas Caldwell is a 87 y.o. year old male who is a primary care patient of [name], DO. The care management team was consulted for assistance with disease management and care coordination needs.    Engaged with patient by telephone for follow up visit in response to provider referral for case management and/or care coordination services.   Consent to Services:  The patient was given information about Chronic Care Management services, agreed to services, and gave verbal consent prior to initiation of services.  Please see initial visit note for detailed documentation.   Patient agreed to services and verbal consent obtained.   Assessment: Review of patient past medical history, allergies, medications, health status, including review of consultants reports, laboratory and other test data, was performed as part of comprehensive evaluation and provision of chronic care management services.   SDOH (Social Determinants of Health) assessments and interventions performed:  SDOH Interventions    Flowsheet Row Most Recent Value  SDOH Interventions   Physical Activity Interventions Other (Comments)  [gets easily short of breath. Limited to the activity he can do]        CCM Care Plan  Allergies  Allergen Reactions   Clopidogrel Anaphylaxis and Other (See Comments)    altered mental status;  (electrolytes "out of wack" and confusion per family; THEY DO NOT REMEMBER ANY OTHER REACTION)- MSB 10/10/15   Ciprofloxacin Itching   Mirtazapine Diarrhea    Bad dreams    Spironolactone Other (See Comments)    gynecomastia    Outpatient Encounter Medications as of 10/02/2020  Medication Sig   acetaminophen (TYLENOL) 500 MG tablet Take 1,000 mg by mouth every 6 (six) hours as needed for moderate pain or headache.    Alcohol Swabs (B-D SINGLE USE SWABS  REGULAR) PADS Use to check blood sugar up to 2 times daily   allopurinol (ZYLOPRIM) 100 MG tablet Take 1 tablet (100 mg total) by mouth daily.   aspirin 81 MG chewable tablet Chew 81 mg by mouth daily.   Blood Glucose Monitoring Suppl (TRUE METRIX METER) DEVI Use to check blood sugar up to 2 times daily   bosutinib (BOSULIF) 100 MG tablet Take 100 mg by mouth at bedtime. Take with food.   carvedilol (COREG) 3.125 MG tablet Take 1 tablet (3.125 mg total) by mouth 2 (two) times daily.   Cholecalciferol 25 MCG (1000 UT) capsule Take 1,000 Units by mouth daily.   EASY TOUCH PEN NEEDLES 31G X 8 MM MISC USE 1 NEEDLE DAILY WITH LEVEMIR   empagliflozin (JARDIANCE) 10 MG TABS tablet Take 1 tablet (10 mg total) by mouth daily with breakfast. Please make an appointment for further refills   ferrous sulfate 325 (65 FE) MG tablet Take by mouth. (Patient not taking: Reported on 09/25/2020)   gabapentin (NEURONTIN) 300 MG capsule TAKE TWO CAPSULES BY MOUTH TWICE DAILY MAX 4 CAPSULE PER DAY - FOR KIDNEY PROTECTION   hydrALAZINE (APRESOLINE) 25 MG tablet Take 1 tablet (25 mg total) by mouth 3 (three) times daily.   Insulin Glargine ([name] KWIKPEN) 100 UNIT/ML Inject 20 Units into the skin daily.   Insulin Pen Needle 31G X 5 MM MISC 31 g by Does not apply route as directed.   isosorbide mononitrate (IMDUR) 60 MG 24 hr tablet Take 1 tablet (60 mg total) by mouth daily.   levothyroxine (SYNTHROID) 25 MCG tablet  TAKE ONE TABLET BY MOUTH EVERY MORNING BEFORE BREAKFAST   losartan (COZAAR) 25 MG tablet Take 1 tablet (25 mg total) by mouth daily.   Menthol-Zinc Oxide (GOLD BOND EX) Apply 1 application topically daily as needed (muscle pain).   mirabegron ER (MYRBETRIQ) 25 MG TB24 tablet Take 1 tablet (25 mg total) by mouth daily. From Urology   Multiple Vitamin (MULTIVITAMIN WITH MINERALS) TABS tablet Take 1 tablet by mouth daily.   nitroGLYCERIN (NITROSTAT) 0.4 MG SL tablet Place 1 tablet (0.4 mg total) under the  tongue every 5 (five) minutes x 3 doses as needed for chest pain. (Patient not taking: Reported on 09/25/2020)   omeprazole (PRILOSEC) 20 MG capsule Take 20 mg by mouth in the morning.    Polyethyl Glycol-Propyl Glycol (SYSTANE OP) Place 1 drop into both eyes 3 (three) times daily as needed (dry/irritated eyes.).    polyethylene glycol (MIRALAX / GLYCOLAX) 17 g packet Take 17 g by mouth daily as needed for mild constipation.    potassium chloride (KLOR-CON) 10 MEQ tablet Take 1 tablet (10 mEq total) by mouth daily.   ranolazine (RANEXA) 500 MG 12 hr tablet TAKE ONE TABLET BY MOUTH TWICE DAILY   rosuvastatin (CRESTOR) 5 MG tablet TAKE ONE TABLET BY MOUTH ONCE DAILY   simethicone (MYLICON) 80 MG chewable tablet Chew 80 mg by mouth every 6 (six) hours as needed for flatulence. (Patient not taking: Reported on 09/25/2020)   tamsulosin (FLOMAX) 0.4 MG CAPS capsule Take 1 capsule (0.4 mg total) by mouth daily after supper.   torsemide (DEMADEX) 20 MG tablet Take 3 tablets (60 mg total) by mouth daily.   traMADol (ULTRAM) 50 MG tablet TAKE 1 OR 2 TABLETS BY MOUTH AT BEDTIME AS NEEDED   TRULICITY 1.5 DX/8.3JA SOPN Inject 1.5 mg into the skin once a week.   warfarin (COUMADIN) 3 MG tablet TAKE 1 TO 1 & 1/2 TABLETS BY MOUTH AS DIRECTED BY THE COUMADIN CLINIC   No facility-administered encounter medications on file as of 10/02/2020.    Patient Active Problem List   Diagnosis Date Noted   Obstructive sleep apnea 06/29/2020   Urge incontinence of urine 12/22/2019   Trifascicular block 10/20/2019   Wound infection 10/08/2019   Bifascicular block 05/13/2019   Coronary stent patent 04/13/2019   Chronic HFrEF (heart failure with reduced ejection fraction) ([place]) 03/19/2019   PSVT (paroxysmal supraventricular tachycardia) ([place]) 03/19/2019   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 ([place]) 08/24/2018   CKD (chronic kidney disease), stage IV ([place]) 06/04/2018   Osteoarthritis of knees, bilateral  06/02/2018   Hyperlipidemia associated with type 2 diabetes mellitus ([place]) 05/06/2018   Pain of lower extremity 03/06/2018   Duodenitis 01/30/2018   Chronic heart failure with preserved ejection fraction (HFpEF) ([place]) 01/23/2018   Hematochezia    Melena    Rectal bleeding 01/11/2018   AVM (arteriovenous malformation) of colon without hemorrhage    Schatzki's ring    Esophageal dysphagia    Diabetic retinopathy ([place]) 07/08/2017   Ischemic cardiomyopathy 06/26/2017   Cardiomyopathy ([place]) 06/26/2017   Centrilobular emphysema ([place]) 06/13/2017   Fatigue 12/18/2016   Stable angina ([place]) 09/20/2016   Stage 3a chronic kidney disease ([place]) 09/20/2016   Paroxysmal atrial fibrillation ([place]) 08/28/2016   Chronic anticoagulation 08/28/2016   Essential hypertension 08/20/2016   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 heart with stable angina pectoris ([place]) 08/19/2016   Accelerating angina ([place]) 08/15/2016   Exertional dyspnea 08/12/2016   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   Gynecomastia, male 03/31/2014   Hypertrophy of breast 03/31/2014   Nuclear sclerosis of left eye 10/22/2013   Enthesopathy of ankle and tarsus 04/10/2013   Controlled type 2 diabetes mellitus with diabetic nephropathy ([place]) 04/09/2013   Diabetic neuropathy associated with type 2 diabetes mellitus ([place]) 04/09/2013   Cataract of left eye 11/25/2011   Corneal opacity 09/30/2011   Pseudophakia of right eye 09/30/2011   GERD (gastroesophageal reflux disease) 09/25/2011   Gout 09/25/2011   Hyperlipidemia LDL goal <70 09/25/2011   Status post cataract extraction 04/11/2011   Cataract extraction status of eye 04/11/2011   Iron deficiency anemia 04/01/2011   Benign localized hyperplasia of prostate without urinary obstruction and other lower urinary tract symptoms (LUTS) 12/10/2010   Enlarged prostate without lower urinary tract symptoms (luts) 12/10/2010   Colon cancer screening 10/11/2010   Hypothyroidism  10/11/2010   Hyperlipidemia 09/20/2003   Essential and other specified forms of tremor 01/05/2001   Essential tremor 01/05/2001    Conditions to be addressed/monitored:CHF, HTN, COPD, DMII, and OSA with the need for CPAP  Care Plan : RNCM: Heart Failure (Adult)  Updates made by Vanita Ingles since 10/02/2020 12:00 AM     Problem: RNCM: Symptom Exacerbation (Heart Failure)   Priority: Medium     Long-Range Goal: RNCM: Symptom Exacerbation Prevented or Minimized   Start Date: 04/06/2020  Expected End Date: 09/27/2021  This Visit's Progress: On track  Priority: Medium  Note:   Current Barriers:  Knowledge deficits related to basic heart failure pathophysiology and self care management Unable to independently manage HF Unable to self administer medications as prescribed Lacks social connections Does not contact provider office for questions/concerns Lack of scale in home- has scales and uses a mat supplied by cardiology clinic- weighs daily and is monitored daily by the clinic  Financial strain  Nurse Case Manager Clinical Goal(s):  Over the next 120 days, patient will weigh self daily and record Over the next 120 days, patient will verbalize understanding of Heart Failure Action Plan and when to call doctor Over the next 120 days, patient will take all Heart Failure mediations as prescribed Interventions:  Collaboration with [name], DO regarding development and update of comprehensive plan of care as evidenced by provider attestation and co-signature Inter-disciplinary care team collaboration (see longitudinal plan of care) Basic overview and discussion of pathophysiology of Heart Failure Provided written and verbal education on low sodium diet- 05-01-2020: The patients wife states he does not have a good appetite but he is adherent to heart healthy diet  Reviewed Heart Failure Action Plan in depth and provided written copy. 10-02-2020: The patient continues to be  monitored at home by using a device that he lays on daily and sends to the heart failure clinic. The patient has not had any recent exacerbations. Denies changes in his HF. Is monitored closely through the HF clinic and the device he lays on daily to send in daily weights. The patients wife states the patient continues to use the device and has not had any out of range weights. Is followed closely.  Assessed for scales in home- has a mat he gets on daily to assess extra fluid Discussed importance of daily weight- 10-02-2020: The patient is compliant with daily weights  Reviewed role of diuretics in prevention of fluid overload. 10-02-2020: The patient is compliant with medications. Recently was taken off of Metoprolol Evaluation of upcoming appointments. Office to call and get the  patient an appointment for follow up with pcp, has an appointment with dr. Saunders Revel on 10-04-2020 at 4 pm- reviewed with the patients wife.   Patient Goals/Self-Care Activities Over the next 120 days, patient will:  - Take Heart Failure Medications as prescribed - Weigh daily and record (notify MD with 3 lb weight gain over night or 5 lb in a week) weight today was 201 - Follow CHF Action Plan - Adhere to low sodium diet - barriers to lifestyle changes reviewed and addressed - barriers to treatment reviewed and addressed - depression screen reviewed - healthy lifestyle promoted - medication-adherence assessment completed - rescue (action) plan developed - rescue (action) plan reviewed - self-awareness of signs/symptoms of worsening disease encouraged Follow Up Plan: Telephone follow up appointment with care management team member scheduled for: 12-11-2020 at 1030 am    Task: RNCM: Identify and Minimize Risk of Heart Failure Exacerbation Completed 10/02/2020  Outcome: Positive  Note:   Care Management Activities:    - barriers to lifestyle changes reviewed and addressed - barriers to treatment reviewed and addressed -  depression screen reviewed - healthy lifestyle promoted - medication-adherence assessment completed - rescue (action) plan developed - rescue (action) plan reviewed - self-awareness of signs/symptoms of worsening disease encouraged        Care Plan : RNCM: Hypertension (Adult)  Updates made by Vanita Ingles since 10/02/2020 12:00 AM     Problem: RNCM: Hypertension (Hypertension)   Priority: Medium     Long-Range Goal: RNCM: Hypertension Monitored   Start Date: 04/06/2020  Expected End Date: 07/29/2021  This Visit's Progress: On track  Priority: Medium  Note:   Objective:  Last practice recorded BP readings:  BP Readings from Last 3 Encounters:  09/15/20 (!) 146/82  08/30/20 121/71  08/22/20 129/62   Most recent eGFR/CrCl:  Lab Results  Component Value Date   EGFR 44 (L) 06/29/2020    No components found for: CRCL Current Barriers:  Knowledge Deficits related to basic understanding of hypertension pathophysiology and self care management Knowledge Deficits related to understanding of medications prescribed for management of hypertension Unable to independently HTN Case Manager Clinical Goal(s):  patient will verbalize understanding of plan for hypertension management patient will attend all scheduled medical appointments: Dr. Saunders Revel on 10-04-2020, office to call and get an appointment set up with the pcp for follow up.  patient will demonstrate improved adherence to prescribed treatment plan for hypertension as evidenced by taking all medications as prescribed, monitoring and recording blood pressure as directed, adhering to low sodium/DASH diet patient will demonstrate improved health management independence as evidenced by checking blood pressure as directed and notifying PCP if SBP>160 or DBP > 90, taking all medications as prescribe, and adhering to a low sodium diet as discussed. patient will verbalize basic understanding of hypertension disease process and self health  management plan as evidenced by compliance with heart healthy/ADA diet, compliance with medications, and working with the CCM team to optimize health and well being.  Interventions:  Collaboration with [name], DO regarding development and update of comprehensive plan of care as evidenced by provider attestation and co-signature Inter-disciplinary care team collaboration (see longitudinal plan of care) Evaluation of current treatment plan related to hypertension self management and patient's adherence to plan as established by provider. 10-02-2020: The patients wife states the patients blood pressures have been up and down. The cardiologist took the patient off of his metoprolol and will be evaluated on 10-04-2020. The patient complains of  dizziness at times. Reviewed with the patients wife being safe and changing position slowly. Will continue to monitor for changes and new concerns. Provided education to patient re: stroke prevention, s/s of heart attack and stroke, DASH diet, complications of uncontrolled blood pressure Reviewed medications with patient and discussed importance of compliance Discussed plans with patient for ongoing care management follow up and provided patient with direct contact information for care management team Advised patient, providing education and rationale, to monitor blood pressure daily and record, calling PCP for findings outside established parameters.  Reviewed scheduled/upcoming provider appointments including: 10-04-2020 with cardiology and office to call for an appointment with the pcp Self-Care Activities: - Self administers medications as prescribed Attends all scheduled provider appointments Calls provider office for new concerns, questions, or BP outside discussed parameters Checks BP and records as discussed Follows a low sodium diet/DASH diet Patient Goals: - check blood pressure 3 times per week - choose a place to take my blood pressure  (home, clinic or office, retail store) - write blood pressure results in a log or diary - agree on reward when goals are met - agree to work together to make changes - ask questions to understand - have a family meeting to talk about healthy habits - learn about high blood pressure  Follow Up Plan: Telephone follow up appointment with care management team member scheduled for: 12-11-2020 at 1030 am    Task: RNCM: Identify and Monitor Blood Pressure Elevation Completed 10/02/2020  Outcome: Positive  Note:   Care Management Activities:    - blood pressure trends reviewed - depression screen reviewed - home or ambulatory blood pressure monitoring encouraged       Care Plan : RNCM: Diabetes Type 2 (Adult)  Updates made by Vanita Ingles since 10/02/2020 12:00 AM     Problem: RNCM: Glycemic Management (Diabetes, Type 2)   Priority: Medium     Long-Range Goal: RNCM: Glycemic Management Optimized   Start Date: 04/06/2020  Expected End Date: 09/27/2021  This Visit's Progress: On track  Priority: Medium  Note:   Objective:  Lab Results  Component Value Date   HGBA1C 7.1 (A) 06/14/2020     Lab Results  Component Value Date   CREATININE 1.72 (H) 04/13/2020   BUN 27 (H) 04/13/2020   NA 137 04/13/2020   K 4.3 04/13/2020   CL 102 04/13/2020   CO2 25 04/13/2020    No results found for: EGFR Current Barriers:  Knowledge Deficits related to basic Diabetes pathophysiology and self care/management Knowledge Deficits related to medications used for management of diabetes Difficulty obtaining or cannot afford medications Limited Social Support Unable to self administer medications as prescribed Lacks social connections Does not contact provider office for questions/concerns Case Manager Clinical Goal(s):  Collaboration with [name], DO regarding development and update of comprehensive plan of care as evidenced by provider attestation and  co-signature Inter-disciplinary care team collaboration (see longitudinal plan of care) Over the next 120 days, patient will demonstrate improved adherence to prescribed treatment plan for diabetes self care/management as evidenced by:  daily monitoring and recording of CBG- 06-26-2020: Per the patients wife his blood sugars are 120-156 and staying under 200. 10-02-2020: States his blood sugar this am was 114. Will have new blood work soon. Education and support given.  adherence to ADA/ carb modified diet adherence to prescribed medication regimen Interventions:  Provided education to patient about basic DM disease process Reviewed medications with patient and discussed importance of medication  adherence. 10-02-2020: The patient is compliant with the current medications regimen. Working with pharm D on a consistent basis.  Discussed plans with patient for ongoing care management follow up and provided patient with direct contact information for care management team Provided patient with written educational materials related to hypo and hyperglycemia and importance of correct treatment. 10-02-2020: Denies any lows at this time. States he is eating well.  Reviewed scheduled/upcoming provider appointments including:  saw pcp 06-14-2020, no upcoming appointments but will call for changes. Sees podiatrist today, 06-26-2020 and is also working with PT for strengthening and balance. 10-02-2020: Ask for the office to call and set up a follow up for pcp. In basket message sent to the office staff to call and get an appointment for the patient for follow up.  Advised patient, providing education and rationale, to check cbg bid  and record, calling pcp for findings outside established parameters.  10-02-2020: The patient is compliant with blood sugar checks. Praised for lower A1C. His wife helps manage his care.  Review of patient status, including review of consultants reports, relevant laboratory and other test  results, and medications completed. Evaluation of diabetic shoes. The patient has his diabetic shoes and they are working well for him. He has an appointment today to see the podiatrist about his big toe on his right foot. [name] being followed closely. Education and support given. Will continue to monitor.  Patient Goals/Self-Care Activities Over the next 120 days, patient will:  - Self administers oral medications as prescribed Self administers insulin as prescribed Attends all scheduled provider appointments Checks blood sugars as prescribed and utilize hyper and hypoglycemia protocol as needed Adheres to prescribed ADA/carb modified - barriers to adherence to treatment plan identified - blood glucose monitoring encouraged - blood glucose readings reviewed - individualized medical nutrition therapy provided - resources required to improve adherence to care identified - self-awareness of signs/symptoms of hypo or hyperglycemia encouraged - use of blood glucose monitoring log promoted Follow Up Plan: Telephone follow up appointment with care management team member scheduled for: 12-11-2020 at 1030 am     Task: RNCM: Alleviate Barriers to Glycemic Management Completed 10/02/2020  Outcome: Positive  Note:   Care Management Activities:    - barriers to adherence to treatment plan identified - blood glucose monitoring encouraged - blood glucose readings reviewed - individualized medical nutrition therapy provided - resources required to improve adherence to care identified - self-awareness of signs/symptoms of hypo or hyperglycemia encouraged - use of blood glucose monitoring log promoted        Care Plan : RNCM: COPD (Adult)  Updates made by Vanita Ingles since 10/02/2020 12:00 AM     Problem: RNCM: Disease Progression (COPD)   Priority: High     Long-Range Goal: RNCM: Disease Progression Minimized or Managed   Start Date: 04/06/2020  Expected End Date: 09/27/2021  This Visit's  Progress: On track  Priority: High  Note:   Current Barriers:  Knowledge deficits related to basic understanding of COPD disease process Knowledge deficits related to basic COPD self care/management Knowledge deficit related to basic understanding of how to use inhalers and how inhaled medications work Knowledge deficit related to importance of energy conservation Unable to independently manage COPD as evidence of continued shortness of breath in patient with multiple chronic conditions.  Does not contact provider office for questions/concerns  Case Manager Clinical Goal(s): patient will report using inhalers as prescribed including rinsing mouth after use patient will report utilizing  pursed lip breathing for shortness of breath patient will verbalize understanding of COPD action plan and when to seek appropriate levels of medical care patient will engage in lite exercise as tolerated to build/regain stamina and strength and reduce shortness of breath through activity tolerance patient will verbalize basic understanding of COPD disease process and self care activities patient will not be hospitalized for COPD exacerbation as evidenced  Interventions:  Collaboration with [name], DO regarding development and update of comprehensive plan of care as evidenced by provider attestation and co-signature Inter-disciplinary care team collaboration (see longitudinal plan of care) Provided patient with basic written and verbal COPD education on self care/management/and exacerbation prevention. Per the patients wife he has OSA also and needs a CPAP machine. She called about it on Friday. States the order has been there since May. Advised the patients wife to call weekly for updates. Will continue to monitor.  Provided patient with COPD action plan and reinforced importance of daily self assessment. May have some additional shortness of breath due to his heart failure. Is being monitored  closely.  Discussed Pulmonary Rehab and offered to assist with referral placement Provided written and verbal instructions on pursed lip breathing and utilized returned demonstration as teach back Provided instruction about proper use of medications used for management of COPD including inhalers Advised patient to self assesses COPD action plan zone and make appointment with provider if in the yellow zone for 48 hours without improvement. Provided patient with education about the role of exercise in the management of COPD Advised patient to engage in light exercise as tolerated 3-5 days a week Provided education about and advised patient to utilize infection prevention strategies to reduce risk of respiratory infection  Self-Care Activities:  Patient verbalizes understanding of plan to effective management of COPD Self administers medications as prescribed Attends all scheduled provider appointments Calls pharmacy for medication refills Attends church or other social activities Performs ADL's independently Performs IADL's independently Calls provider office for new concerns or questions Patient Goals: - do breathing exercises at least 2 times each day - do exercises in a comfortable position that makes breathing as easy as possible - develop a new routine to improve sleep - don't eat or exercise right before bedtime - eat healthy - get at least 7 to 8 hours of sleep at night - get outdoors every day (weather permitting) - keep room cool and dark - limit daytime naps - practice relaxation or meditation daily - use a fan or white noise in bedroom - use devices that will help like a cane, sock-puller or reacher - develop a rescue plan - eliminate symptom triggers at home - follow rescue plan if symptoms flare-up - keep follow-up appointments - use an extra pillow to sleep - avoid second hand smoke - eliminate smoking in my home - identify and avoid work-related triggers - identify  and remove indoor air pollutants - limit outdoor activity during cold weather - listen for public air quality announcements every day Follow Up Plan: Telephone follow up appointment with care management team member scheduled for: 12-11-2020 at 1030 am    Task: RNCM: Alleviate Barriers to COPD Management Completed 10/02/2020  Outcome: Positive  Note:   Care Management Activities:    - activity or exercise based on tolerance encouraged - barriers to treatment managed - breathing techniques encouraged - communicable disease prevention promoted - individualized medical nutrition therapy provided - inhaler technique observed - medication-adherence assessment completed - pulmonary rehabilitation encouraged -  quality of sleep assessed - rescue (action) plan reviewed - screen for functional limitations completed and reviewed - smoking counseling provided - self-awareness of symptom triggers encouraged    Notes:      Plan:Telephone follow up appointment with care management team member scheduled for:  12-11-2020 at 76 am  [place], MSN, [facility] [place] Mobile: [phone]

## 2020-10-04 ENCOUNTER — Ambulatory Visit (INDEPENDENT_AMBULATORY_CARE_PROVIDER_SITE_OTHER): Payer: Medicare HMO

## 2020-10-04 ENCOUNTER — Ambulatory Visit (INDEPENDENT_AMBULATORY_CARE_PROVIDER_SITE_OTHER): Payer: Medicare HMO | Admitting: Internal Medicine

## 2020-10-04 ENCOUNTER — Other Ambulatory Visit: Payer: Self-pay

## 2020-10-04 ENCOUNTER — Encounter: Payer: Self-pay | Admitting: Internal Medicine

## 2020-10-04 VITALS — BP 110/70 | HR 82 | Ht 63.0 in | Wt 194.0 lb

## 2020-10-04 DIAGNOSIS — Z7901 Long term (current) use of anticoagulants: Secondary | ICD-10-CM

## 2020-10-04 DIAGNOSIS — I25118 Atherosclerotic heart disease of native coronary artery with other forms of angina pectoris: Secondary | ICD-10-CM

## 2020-10-04 DIAGNOSIS — I480 Paroxysmal atrial fibrillation: Secondary | ICD-10-CM

## 2020-10-04 DIAGNOSIS — Z5181 Encounter for therapeutic drug level monitoring: Secondary | ICD-10-CM

## 2020-10-04 DIAGNOSIS — E785 Hyperlipidemia, unspecified: Secondary | ICD-10-CM | POA: Diagnosis not present

## 2020-10-04 DIAGNOSIS — N183 Chronic kidney disease, stage 3 unspecified: Secondary | ICD-10-CM

## 2020-10-04 DIAGNOSIS — I5022 Chronic systolic (congestive) heart failure: Secondary | ICD-10-CM

## 2020-10-04 LAB — POCT INR: INR: 1.8 — AB (ref 2.0–3.0)

## 2020-10-04 NOTE — Patient Instructions (Signed)
-   take 2 tablets warfarin tonight and tomorrow, then  - continue dosage of warfarin of 1.5 TABLETS EVERY DAY EXCEPT 1 TABLETS ON MONDAYS, [place]. - recheck INR in 6 weeks.

## 2020-10-04 NOTE — Progress Notes (Signed)
Follow-up Outpatient Visit Date: 10/04/2020  Primary Care Provider: [name], DO [address] [place] [address]  Chief Complaint: Nightmares  HPI:  Lucas Caldwell is a 87 y.o. 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  I last saw Lucas Caldwell in April, at which time he was feeling fairly well, noticeably better than 6 months earlier.  He was seen in follow-up about 3 weeks ago by Lucas Quarry, PA, due to concerns with his blood pressure and associated lightheadedness.  He was noted to have orthostatic hypotension in the office.  EKG showed sinus rhythm with mild QT prolongation.  He was switched from metoprolol to carvedilol out of concern that metoprolol was contributing to nightmares.  Today, Lucas Caldwell is most concerned about persistent nightmares that have actually worsened since switching from metoprolol to carvedilol.  He also reports that he has quite a bit of indigestion, feeling like he has to burp.  This is not related to eating or any particular activity.  He otherwise denies frank chest pain.  [name] also off balance.  He has not had any edema.  His weight has been stable.  Chronic exertional dyspnea is stable.  [name] discouraged that he is not feeling better.  He endorses being fairly sedentary the last few months.  [name] scheduled to follow-up with his oncologist at [facility] in September.  --------------------------------------------------------------------------------------------------  Past Medical  History:  Diagnosis Date   Arthritis    "probably in his legs" (08/15/2016)   Blind right eye    BPH (benign prostatic hyperplasia)    Breast asymmetry    Left breast is larger, present for several years.   CAD (coronary artery disease)    a. Cath in the late 90's - reportedly ok;  b. 2014 s/p stenting x 2 @ [facility]; c 08/19/16 Cath/PCI with DES -> RCA, plan to treat LM 70% medically. Seen by surgery and felt to be too high risk for CABG; d. 08/2018 Cath: LM 70 (iFR 0.86), LAD 20ost, 40p, 50/57m, D1 70, LCX patent stent, OM1 30, RCA patent stent, 53m ISR, 40d, RPDA 30. PCWP 8. CO/CI 4.0/2.1; e. 03/2019 PCI to LAD (2.5x15 Resolute Onyx DES).   Chronic combined systolic (congestive) and diastolic (congestive) heart failure ([place])    a. Previously reduced EF-->50% by echo in 2012;  b. 06/2015 Echo: EF 50-55%  c. 07/2016 Echo: EF 45-50%; d. 12/2017 Echo: EF 55%, Gr1 DD, mild MR; e. 08/2018 Echo: EF 35-40%, mildly dil PA; f. 04/2019 Echo: EF 30-35%, glob HK, nl RV fxn; g. s/p Cardiomems.   Chronic kidney disease (CKD), stage IV (severe) (HCC)    CML (chronic myelocytic leukemia) (HCC)    GERD (gastroesophageal reflux disease)    GIB (gastrointestinal bleeding)    a. 12/2017 3 unit PRBC GIB in setting of coumadin-->Endo/colon multiple duodenal ulcers and a single bleeding ulcer in the proximal ascending colon status post hemostatic clipping x2.   Gout    Hyperlipemia    Hypertension    Hypothyroidism    Iron deficiency anemia    Ischemic cardiomyopathy    a. Previously reduced  EF-->50% by echo in 2012;  b. 06/2015 Echo: EF 50-55%, Gr2 DD, mild MR, mildly dil LA, Ao sclerosis, mild TR;  c. 07/2016 Echo: EF 45-50%; d. 12/2017 Echo: EF 55%, Gr1 DD, mild MR; e. 08/2018 Echo: EF 35-40%; f. 04/2019 Echo: EF 30-35%.   Migraine    "in the 1960s" (08/15/2016)   Obstructive sleep apnea    PAF (paroxysmal atrial fibrillation) (HCC)    a. ? Dx 2014-->s/p DCCV;  b. CHA2DS2VASc = 6--> Coumadin.   Prostate cancer ([place])     RBBB    Type II diabetes mellitus ([place])    Past Surgical History:  Procedure Laterality Date   CATARACT EXTRACTION W/ INTRAOCULAR LENS  IMPLANT, BILATERAL Bilateral    COLONOSCOPY N/A 01/13/2018   Procedure: COLONOSCOPY;  Surgeon: Lin Landsman, MD;  Location: [facility] ENDOSCOPY;  Service: Gastroenterology;  Laterality: N/A;   COLONOSCOPY WITH PROPOFOL N/A 01/01/2018   Procedure: COLONOSCOPY WITH PROPOFOL;  Surgeon: Lin Landsman, MD;  Location: [facility] ENDOSCOPY;  Service: Gastroenterology;  Laterality: N/A;   CORONARY ANGIOPLASTY WITH STENT PLACEMENT  09/2012   2 stents   CORONARY STENT INTERVENTION N/A 08/19/2016   Procedure: Coronary Stent Intervention;  Surgeon: [name], MD;  Location: [place] CV LAB;  Service: Cardiovascular;  Laterality: N/A;   CORONARY STENT INTERVENTION N/A 04/13/2019   Procedure: CORONARY STENT INTERVENTION;  Surgeon: [name], MD;  Location: [place] CV LAB;  Service: Cardiovascular;  Laterality: N/A;   ESOPHAGEAL MANOMETRY N/A 12/16/2017   Procedure: ESOPHAGEAL MANOMETRY (EM);  Surgeon: Lin Landsman, MD;  Location: [facility] ENDOSCOPY;  Service: Gastroenterology;  Laterality: N/A;   ESOPHAGOGASTRODUODENOSCOPY N/A 12/20/2016   Procedure: ESOPHAGOGASTRODUODENOSCOPY (EGD);  Surgeon: Lin Landsman, MD;  Location: [facility] ENDOSCOPY;  Service: Gastroenterology;  Laterality: N/A;   ESOPHAGOGASTRODUODENOSCOPY N/A 01/13/2018   Procedure: ESOPHAGOGASTRODUODENOSCOPY (EGD);  Surgeon: Lin Landsman, MD;  Location: [facility] ENDOSCOPY;  Service: Gastroenterology;  Laterality: N/A;   ESOPHAGOGASTRODUODENOSCOPY (EGD) WITH PROPOFOL N/A 11/03/2017   Procedure: ESOPHAGOGASTRODUODENOSCOPY (EGD) WITH PROPOFOL;  Surgeon: Lin Landsman, MD;  Location: [facility] ENDOSCOPY;  Service: Gastroenterology;  Laterality: N/A;   EYE SURGERY     HERNIA REPAIR     "navel"   LEFT HEART CATH AND CORONARY ANGIOGRAPHY N/A 08/14/2016   Procedure: Left Heart Cath and  Coronary Angiography;  Surgeon: [name], MD;  Location: [place] [facility] LAB;  Service: Cardiovascular;  Laterality: N/A;   PRESSURE SENSOR/CARDIOMEMS N/A 09/23/2019   Procedure: PRESSURE SENSOR/CARDIOMEMS;  Surgeon: Jolaine Artist, MD;  Location: [place] CV LAB;  Service: Cardiovascular;  Laterality: N/A;   RIGHT HEART CATH N/A 09/23/2019   Procedure: RIGHT HEART CATH;  Surgeon: Jolaine Artist, MD;  Location: [place] CV LAB;  Service: Cardiovascular;  Laterality: N/A;   RIGHT/LEFT HEART CATH AND CORONARY ANGIOGRAPHY N/A 09/01/2018   Procedure: RIGHT/LEFT HEART CATH AND CORONARY ANGIOGRAPHY;  Surgeon: [name], MD;  Location: [place] CV LAB;  Service: Cardiovascular;  Laterality: N/A;   RIGHT/LEFT HEART CATH AND CORONARY ANGIOGRAPHY N/A 04/13/2019   Procedure: RIGHT/LEFT HEART CATH AND CORONARY ANGIOGRAPHY;  Surgeon: [name], MD;  Location: [place] [facility] LAB;  Service: Cardiovascular;  Laterality: N/A;   TOE AMPUTATION Right    "big toe"     Current Meds  Medication Sig   acetaminophen (TYLENOL) 500 MG tablet Take 1,000 mg by mouth every 6 (six) hours as needed for moderate pain or headache.    Alcohol Swabs (B-D SINGLE USE SWABS REGULAR) PADS Use  to check blood sugar up to 2 times daily   allopurinol (ZYLOPRIM) 100 MG tablet Take 1 tablet (100 mg total) by mouth daily.   aspirin 81 MG chewable tablet Chew 81 mg by mouth daily.   Blood Glucose Monitoring Suppl (TRUE METRIX METER) DEVI Use to check blood sugar up to 2 times daily   bosutinib (BOSULIF) 100 MG tablet Take 100 mg by mouth at bedtime. Take with food.   carvedilol (COREG) 3.125 MG tablet Take 1 tablet (3.125 mg total) by mouth 2 (two) times daily.   Cholecalciferol 25 MCG (1000 UT) capsule Take 1,000 Units by mouth daily.   EASY TOUCH PEN NEEDLES 31G X 8 MM MISC USE 1 NEEDLE DAILY WITH LEVEMIR   empagliflozin (JARDIANCE) 10 MG TABS tablet Take 1 tablet (10 mg total) by mouth daily  with breakfast. Please make an appointment for further refills   ferrous sulfate 325 (65 FE) MG tablet Take by mouth.   gabapentin (NEURONTIN) 300 MG capsule TAKE TWO CAPSULES BY MOUTH TWICE DAILY MAX 4 CAPSULE PER DAY - FOR KIDNEY PROTECTION   hydrALAZINE (APRESOLINE) 25 MG tablet Take 1 tablet (25 mg total) by mouth 3 (three) times daily.   Insulin Glargine ([name] KWIKPEN) 100 UNIT/ML Inject 20 Units into the skin daily.   Insulin Pen Needle 31G X 5 MM MISC 31 g by Does not apply route as directed.   isosorbide mononitrate (IMDUR) 60 MG 24 hr tablet Take 1 tablet (60 mg total) by mouth daily.   levothyroxine (SYNTHROID) 25 MCG tablet TAKE ONE TABLET BY MOUTH EVERY MORNING BEFORE BREAKFAST   losartan (COZAAR) 25 MG tablet Take 1 tablet (25 mg total) by mouth daily.   Menthol-Zinc Oxide (GOLD BOND EX) Apply 1 application topically daily as needed (muscle pain).   mirabegron ER (MYRBETRIQ) 25 MG TB24 tablet Take 1 tablet (25 mg total) by mouth daily. From Urology   Multiple Vitamin (MULTIVITAMIN WITH MINERALS) TABS tablet Take 1 tablet by mouth daily.   nitroGLYCERIN (NITROSTAT) 0.4 MG SL tablet Place 1 tablet (0.4 mg total) under the tongue every 5 (five) minutes x 3 doses as needed for chest pain.   omeprazole (PRILOSEC) 20 MG capsule Take 20 mg by mouth in the morning.    Polyethyl Glycol-Propyl Glycol (SYSTANE OP) Place 1 drop into both eyes 3 (three) times daily as needed (dry/irritated eyes.).    polyethylene glycol (MIRALAX / GLYCOLAX) 17 g packet Take 17 g by mouth daily as needed for mild constipation.    potassium chloride (KLOR-CON) 10 MEQ tablet Take 1 tablet (10 mEq total) by mouth daily.   ranolazine (RANEXA) 500 MG 12 hr tablet TAKE ONE TABLET BY MOUTH TWICE DAILY   rosuvastatin (CRESTOR) 5 MG tablet TAKE ONE TABLET BY MOUTH ONCE DAILY   simethicone (MYLICON) 80 MG chewable tablet Chew 80 mg by mouth every 6 (six) hours as needed for flatulence.   tamsulosin (FLOMAX) 0.4 MG CAPS  capsule Take 1 capsule (0.4 mg total) by mouth daily after supper.   torsemide (DEMADEX) 20 MG tablet Take 3 tablets (60 mg total) by mouth daily.   traMADol (ULTRAM) 50 MG tablet TAKE 1 OR 2 TABLETS BY MOUTH AT BEDTIME AS NEEDED   TRULICITY 1.5 OE/4.2PN SOPN Inject 1.5 mg into the skin once a week.   warfarin (COUMADIN) 3 MG tablet TAKE 1 TO 1 & 1/2 TABLETS BY MOUTH AS DIRECTED BY THE COUMADIN CLINIC    Allergies: Clopidogrel, Ciprofloxacin, Mirtazapine, and Spironolactone  Social History   Tobacco Use   Smoking status: Former    Packs/day: 1.75    Years: 20.00    Pack years: 35.00    Types: Cigarettes    Start date: 1955    Quit date: 1975    Years since quitting: 47.5   Smokeless tobacco: Former  Scientific laboratory technician Use: Never used  Substance Use Topics   Alcohol use: No    Comment: previously drank heavily - quit 1979.   Drug use: No    Family History  Problem Relation Age of Onset   Brain cancer Father    Diabetes Sister    Heart attack Sister     Review of Systems: A 12-system review of systems was performed and was negative except as noted in the HPI.  --------------------------------------------------------------------------------------------------  Physical Exam: BP 110/70 (BP Location: Left Arm, Patient Position: Sitting, Cuff Size: Large)   Pulse 82   Ht 5\' 3"  (1.6 m)   Wt 194 lb (88 kg)   SpO2 97%   BMI 34.37 kg/m   General:  NAD. Neck: No JVD or HJR. Lungs: Clear to auscultation bilaterally without wheezes or crackles. Heart: Regular rate and rhythm without murmurs, rubs, or gallops. Abdomen: Soft, nontender, nondistended. Extremities: No lower extremity edema.  EKG: Normal sinus rhythm with bifascicular block (RBBB and LAFB).  QTc 502 ms.  No significant change from prior tracing on 09/15/2020.  Lab Results  Component Value Date   WBC 13.6 (H) 08/16/2020   HGB 12.3 (L) 08/16/2020   HCT 37.3 (L) 08/16/2020   MCV 87.6 08/16/2020   PLT 168  08/16/2020    Lab Results  Component Value Date   NA 135 08/16/2020   K 4.7 08/16/2020   CL 100 08/16/2020   CO2 24 08/16/2020   BUN 23 08/16/2020   CREATININE 1.73 (H) 08/16/2020   GLUCOSE 138 (H) 08/16/2020   ALT 13 08/16/2020    Lab Results  Component Value Date   CHOL 123 06/29/2020   HDL 29 (L) 06/29/2020   LDLCALC 65 06/29/2020   TRIG 170 (H) 06/29/2020   CHOLHDL 4.2 06/29/2020    --------------------------------------------------------------------------------------------------  ASSESSMENT AND PLAN: Chronic HFrEF: Lucas Caldwell appears euvolemic though his energy and breathing seem to be worse than when I last saw him a few months ago.  I have reviewed his recent CardioMEMS reports, which show that he is at goal with his target PA diastolic of 15.  We will discontinue carvedilol in case it is contributing to his symptoms, particularly nightmares.  If he does not notice any significant improvement in the next month, carvedilol or metoprolol succinate should be restarted.  I encouraged him to increase his activity, as I suspect that deconditioning is playing a large role in his worsening fatigue and dyspnea.  We have agreed to repeat an echocardiogram at Lucas Caldwell' convenience to ensure that his LVEF has not declined further.  Coronary artery disease with stable angina: Lucas Caldwell reports atypical chest pain that is difficult to connect with his underlying CAD or GERD.  I favor continued aggressive medical therapy; we will discontinue carvedilol as above but continue current doses of isosorbide mononitrate and ranolazine.  If his symptoms do not improve, functional assessment with myocardial perfusion stress test should be considered.  I would reserve coronary angiography for severe refractory symptoms given comorbidities and lack of significant improvement following PCI to the LAD last year.  Paroxysmal atrial fibrillation: EKG shows sinus rhythm today.  Continue  anticoagulation with warfarin.  Hold carvedilol, as above, to see if nightmares improve.  Chronic kidney disease stage III: Stable renal function on last check.  Hyperlipidemia: LDL at goal on last check in April.  Triglycerides mildly elevated.  Continue rosuvastatin 5 mg daily.  Follow-up: Return to clinic after completion of echocardiogram.  [name], MD 10/04/2020 4:29 PM

## 2020-10-04 NOTE — Patient Instructions (Signed)
Medication Instructions:   Your physician has recommended you make the following change in your medication:   STOP taking Carvedilol   *If you need a refill on your cardiac medications before your next appointment, please call your pharmacy*   Lab Work:  None ordered  Testing/Procedures:  Your physician has requested that you have an echocardiogram. Echocardiography is a painless test that uses sound waves to create images of your heart. It provides your doctor with information about the size and shape of your heart and how well your heart's chambers and valves are working. This procedure takes approximately one hour. There are no restrictions for this procedure.   Follow-Up: At [facility], you and your health needs are our priority.  As part of our continuing mission to provide you with exceptional heart care, we have created designated Provider Care Teams.  These Care Teams include your primary Cardiologist (physician) and Advanced Practice Providers (APPs -  Physician Assistants and Nurse Practitioners) who all work together to provide you with the care you need, when you need it.  We recommend signing up for the patient portal called "MyChart".  Sign up information is provided on this After Visit Summary.  MyChart is used to connect with patients for Virtual Visits (Telemedicine).  Patients are able to view lab/test results, encounter notes, upcoming appointments, etc.  Non-urgent messages can be sent to your provider as well.   To learn more about what you can do with MyChart, go to [email or web address].    Your next appointment:    Follow up (shortly after) echocardiogram   The format for your next appointment:   In Person  Provider:   You may see [name], MD or one of the following Advanced Practice Providers on your designated Care Team:   [name], NP [name], PA-C [name], PA-C Cadence Hi-Nella, [place]

## 2020-10-05 ENCOUNTER — Encounter: Payer: Self-pay | Admitting: Internal Medicine

## 2020-10-06 ENCOUNTER — Ambulatory Visit: Payer: Medicare HMO

## 2020-10-11 ENCOUNTER — Other Ambulatory Visit: Payer: Self-pay

## 2020-10-11 ENCOUNTER — Ambulatory Visit: Payer: Medicare HMO

## 2020-10-11 DIAGNOSIS — R2681 Unsteadiness on feet: Secondary | ICD-10-CM | POA: Diagnosis not present

## 2020-10-11 DIAGNOSIS — M6281 Muscle weakness (generalized): Secondary | ICD-10-CM

## 2020-10-11 DIAGNOSIS — R2689 Other abnormalities of gait and mobility: Secondary | ICD-10-CM

## 2020-10-11 NOTE — Therapy (Signed)
[place] MAIN [facility] SERVICES [address] [place], [place], [address] Phone: [phone]   Fax:  [phone]  Physical Therapy Treatment  Patient Details  Name: Lucas Caldwell MRN: [id number] Date of Birth: 26-Oct-1933 Referring Provider (PT): [name], DO   Encounter Date: 10/11/2020   PT End of Session - 10/11/20 1639     Visit Number 18    Number of Visits 49    Date for PT Re-Evaluation 12/06/20    Authorization Time Period eval performed on 06/06/2020    PT Start Time 1019    PT Stop Time 1100    PT Time Calculation (min) 41 min    Equipment Utilized During Treatment Gait belt    Activity Tolerance Patient tolerated treatment well    Behavior During Therapy [facility] for tasks assessed/performed             Past Medical History:  Diagnosis Date   Arthritis    "probably in his legs" (08/15/2016)   Blind right eye    BPH (benign prostatic hyperplasia)    Breast asymmetry    Left breast is larger, present for several years.   CAD (coronary artery disease)    a. Cath in the late 90's - reportedly ok;  b. 2014 s/p stenting x 2 @ [facility]; c 08/19/16 Cath/PCI with DES -> RCA, plan to treat LM 70% medically. Seen by surgery and felt to be too high risk for CABG; d. 08/2018 Cath: LM 70 (iFR 0.86), LAD 20ost, 40p, 50/5m D1 70, LCX patent stent, OM1 30, RCA patent stent, 487mSR, 40d, RPDA 30. PCWP 8. CO/CI 4.0/2.1; e. 03/2019 PCI to LAD (2.5x15 Resolute Onyx DES).   Chronic combined systolic (congestive) and diastolic (congestive) heart failure ([place]   a. Previously reduced EF-->50% by echo in 2012;  b. 06/2015 Echo: EF 50-55%  c. 07/2016 Echo: EF 45-50%; d. 12/2017 Echo: EF 55%, Gr1 DD, mild MR; e. 08/2018 Echo: EF 35-40%, mildly dil PA; f. 04/2019 Echo: EF 30-35%, glob HK, nl RV fxn; g. s/p Cardiomems.   Chronic kidney disease (CKD), stage IV (severe) (HCC)    CML (chronic myelocytic leukemia) (HCC)    GERD (gastroesophageal reflux  disease)    GIB (gastrointestinal bleeding)    a. 12/2017 3 unit PRBC GIB in setting of coumadin-->Endo/colon multiple duodenal ulcers and a single bleeding ulcer in the proximal ascending colon status post hemostatic clipping x2.   Gout    Hyperlipemia    Hypertension    Hypothyroidism    Iron deficiency anemia    Ischemic cardiomyopathy    a. Previously reduced EF-->50% by echo in 2012;  b. 06/2015 Echo: EF 50-55%, Gr2 DD, mild MR, mildly dil LA, Ao sclerosis, mild TR;  c. 07/2016 Echo: EF 45-50%; d. 12/2017 Echo: EF 55%, Gr1 DD, mild MR; e. 08/2018 Echo: EF 35-40%; f. 04/2019 Echo: EF 30-35%.   Migraine    "in the 1960s" (08/15/2016)   Obstructive sleep apnea    PAF (paroxysmal atrial fibrillation) (HCC)    a. ? Dx 2014-->s/p DCCV;  b. CHA2DS2VASc = 6--> Coumadin.   [place]state cancer ([place]   RBBB    Type II diabetes mellitus ([place]    Past Surgical History:  Procedure Laterality Date   CATARACT EXTRACTION W/ INTRAOCULAR LENS  IMPLANT, BILATERAL Bilateral    COLONOSCOPY N/A 01/13/2018   Procedure: COLONOSCOPY;  Surgeon: VaLin LandsmanMD;  Location: [facility] ENDOSCOPY;  Service: Gastroenterology;  Laterality: N/A;   COLONOSCOPY WITH PROPOFOL N/A 01/01/2018   Procedure: COLONOSCOPY WITH PROPOFOL;  Surgeon: Lin Landsman, MD;  Location: [facility] ENDOSCOPY;  Service: Gastroenterology;  Laterality: N/A;   CORONARY ANGIOPLASTY WITH STENT PLACEMENT  09/2012   2 stents   CORONARY STENT INTERVENTION N/A 08/19/2016   Procedure: Coronary Stent Intervention;  Surgeon: [name], MD;  Location: [place] CV LAB;  Service: Cardiovascular;  Laterality: N/A;   CORONARY STENT INTERVENTION N/A 04/13/2019   Procedure: CORONARY STENT INTERVENTION;  Surgeon: [name], MD;  Location: [place] CV LAB;  Service: Cardiovascular;  Laterality: N/A;   ESOPHAGEAL MANOMETRY N/A 12/16/2017   Procedure: ESOPHAGEAL MANOMETRY (EM);  Surgeon: Lin Landsman, MD;  Location: [facility] ENDOSCOPY;   Service: Gastroenterology;  Laterality: N/A;   ESOPHAGOGASTRODUODENOSCOPY N/A 12/20/2016   Procedure: ESOPHAGOGASTRODUODENOSCOPY (EGD);  Surgeon: Lin Landsman, MD;  Location: [facility] ENDOSCOPY;  Service: Gastroenterology;  Laterality: N/A;   ESOPHAGOGASTRODUODENOSCOPY N/A 01/13/2018   Procedure: ESOPHAGOGASTRODUODENOSCOPY (EGD);  Surgeon: Lin Landsman, MD;  Location: [facility] ENDOSCOPY;  Service: Gastroenterology;  Laterality: N/A;   ESOPHAGOGASTRODUODENOSCOPY (EGD) WITH PROPOFOL N/A 11/03/2017   Procedure: ESOPHAGOGASTRODUODENOSCOPY (EGD) WITH PROPOFOL;  Surgeon: Lin Landsman, MD;  Location: [facility] ENDOSCOPY;  Service: Gastroenterology;  Laterality: N/A;   EYE SURGERY     HERNIA REPAIR     "navel"   LEFT HEART CATH AND CORONARY ANGIOGRAPHY N/A 08/14/2016   Procedure: Left Heart Cath and Coronary Angiography;  Surgeon: [name], MD;  Location: [place] CV LAB;  Service: Cardiovascular;  Laterality: N/A;   PRESSURE SENSOR/CARDIOMEMS N/A 09/23/2019   Procedure: PRESSURE SENSOR/CARDIOMEMS;  Surgeon: Jolaine Artist, MD;  Location: [place] CV LAB;  Service: Cardiovascular;  Laterality: N/A;   RIGHT HEART CATH N/A 09/23/2019   Procedure: RIGHT HEART CATH;  Surgeon: Jolaine Artist, MD;  Location: [place] CV LAB;  Service: Cardiovascular;  Laterality: N/A;   RIGHT/LEFT HEART CATH AND CORONARY ANGIOGRAPHY N/A 09/01/2018   Procedure: RIGHT/LEFT HEART CATH AND CORONARY ANGIOGRAPHY;  Surgeon: [name], MD;  Location: [place] CV LAB;  Service: Cardiovascular;  Laterality: N/A;   RIGHT/LEFT HEART CATH AND CORONARY ANGIOGRAPHY N/A 04/13/2019   Procedure: RIGHT/LEFT HEART CATH AND CORONARY ANGIOGRAPHY;  Surgeon: [name], MD;  Location: [place] CV LAB;  Service: Cardiovascular;  Laterality: N/A;   TOE AMPUTATION Right    "big toe"    There were no vitals filed for this visit.   Subjective Assessment - 10/11/20 1021     Subjective Pt reports he  almost fell over the weekend but caught himself. Pt reports he is going to his doctor July 1st d/t R knee pain. Pt wearing soft R knee brace and reports it makes it feel better. He says it helps his knee from buckling as well.    Patient is accompained by: Family member    Pertinent History Pt is an 87 y/o male presenting to physical therapy today with spouse with reports of difficulty with balance, dizziness, and walking. Pt states he started feeling "swimmy headed" approximately one year ago and that it is affecting his balance with walking and has been progressively worsening. Pt reports he feels he is moving when he's still. He denies spinning sensation. He reports he sometimes has headache but says "they are not that bad." Pt reports feeling SOB with walking and household tasks (his doctor is aware). Pt denies unintentional weight gain or loss. Pt reports 1 fall the last 6 months. Per spouse pt  tripped at time of fall and do not provide further details. Pt with extensive PMH. 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    Limitations House hold activities;Lifting;Standing;Walking;Writing   Pt reports when he tries to write his hand shakes a lot   How long can you sit comfortably? not limited    How long can you stand  comfortably? 5 minutes    How long can you walk comfortably? 10 minutes    Diagnostic tests DG Chest 2 View  from 07/06/2019 per chart: "  IMPRESSION:  No acute abnormalities."    Patient Stated Goals Feel more steady with walking    Currently in Pain? Yes    Pain Location Knee    Pain Orientation Right             TREATMENT-  BP assessment: Seated LUE BP: 114/72 mmHg, HR 86 bpm Standing LUE BP: 87/64 mmHg, HR 98 bpm; pt reports feeling light-headed rates it about 7-8/10  Continued with seated therex d/t BP drop and reports of light-headedness- 5# AW on BLES for the following: LAQ 3x16 BLEs; pt rates "medium"  Seated marches 3x16; pt rates "medium"  Nustep for cardioresp. and LE musuclar endurance -  -level 2 x 2 min -level 3 x 4 min Pt rates medium SPM ranged from 60s-79 CGA for mount/dismount.  Monitoring pt throughout for exercise response. No SOB or dizziness with exercise reported.  Leg press - 25# 2x8-10x BLEs  PT continues to provide hands-on assist throughout and VC/TC for technique as pt struggles to keep BLEs on press (likely due to poor plantarflexion strength). Pt reports some light-headedness after leg press exercise  Seated LUE BP: 150/71 HR 85 bpm [place] LUE BP: 124/68 HR 92 bpm Pt reports some continued light-headedness   Pt educated throughout session about proper posture and technique with exercises. Improved exercise technique, movement at target joints, use of target muscles after min to mod verbal, visual, tactile cues.   PT Education - 10/11/20 1048     Education Details exercise technique, body mechanics    Person(s) Educated Patient    Methods Explanation;Demonstration;Tactile cues;Verbal cues    Comprehension Verbalized understanding;Returned demonstration              PT Short Term Goals - 09/14/20 8527       PT SHORT TERM GOAL #1   Title Patient will be independent in home exercise program to improve strength/mobility for  better functional independence with ADLs.    Baseline 06/06/2020: HEP to be initiated next visit; 5/11: Pt HEP to be updated/advanced; 6/29: not yet indep d/t prolonged absence from PT    Time 6    Period Weeks    Status On-going    Target Date 10/25/20               PT Long Term Goals - 09/14/20 0812       PT LONG TERM GOAL #1   Title  Patient will increase FOTO score to equal to or greater than 54 to demonstrate statistically significant improvement in mobility and quality of life.    Baseline 06/06/2020: 46 (risk-adjusted 45); 5/11 73%    Time 12    Period Weeks    Status Achieved      PT LONG TERM GOAL #2   Title Patient will increase 10 meter walk test to >1.78ms as to improve gait speed for better community ambulation and to reduce fall risk.    Baseline 06/06/2020: 0.59 m/s with SPC; 5/11: 0.83 m/s SPC; 6/29: 0.67 m/s with SPC    Time 12    Period Weeks    Status On-going    Target Date 12/06/20      PT LONG TERM GOAL #3   Title Patient will increase Berg Balance score by > 6 points to demonstrate decreased fall risk during functional activities.    Baseline 06/06/2020: 40/56;  5/11: 46/56; 6/29: deferred d/t time    Time 12    Period Weeks    Status Partially Met    Target Date 12/06/20      PT LONG TERM GOAL #4   Title Patient (> 687years old) will complete five times sit to stand test in < 15 seconds indicating an increased LE strength and improved balance.    Baseline 06/06/2020: 18 sec performed with BUE assist off chair; 5/11: 12 seconds using BUEs    Time 12    Period Weeks    Status Achieved      PT LONG TERM GOAL #5   Title Patient will reduce dizziness handicap inventory score by at least 18 points, for less dizziness with ADLs and increased safety with home and work tasks.    Baseline 06/06/2020: 48% (moderate handicap); 5/11: 36%; 6/29: 58%    Time 12    Period Weeks    Status On-going    Target Date 12/06/20      PT LONG TERM GOAL #6   Title  Patient will increase Functional Gait Assessment score to >20/30 as to reduce fall risk and improve dynamic gait safety with community ambulation.    Baseline 06/13/2020: 15/30 5/11: deferred this session due to time; 6/29: 14/30    Time 11    Period Weeks    Status On-going    Target Date 12/06/20              Plan - 10/11/20 1645     Clinical Impression Statement Pt exercises continued to be limited secondary to BP drop with positional change with lightheaded sx. Pt did tolerate majority of seated therex well, but did report some sx with leg press that improved after pt was done with exercise. PT provided pt and his spouse with BP readings this session to bring to his next doctor's appointment. The pt will benefit from further skilled PT to improve endurance, strength and balance to decrease fall risk and improve QOL.    Personal Factors and Comorbidities Age;Comorbidity 3+;Fitness;Comorbidity 1;Comorbidity 2;Past/Current Experience;Time since onset of injury/illness/exacerbation    Comorbidities Pertinent PMH/comorbidities per chart: Exertional dyspnea, centrilobular emphysema, angina, CAD, acute on chronic HFrEF, controlled DMII with diabetic nephropathy, diabetic neuropathy, gout, CKD, essential HTN, paroxysmal atrial fibrillation, chronic anticoagulation, CKD, fatigue, iron deficiency anemia, pain of LE, OA of B knees, hx of partial ray amputation of R great toe, s/p cataract extraction status of eye, enthesopathy of ankle and tarsus, GERD, hyperlipidemia, hypothyroidism, nuclear sclerosis of L eye, psedophakia of R eye,  ischemic cardiomyopathy, esophageal dysphagia, chronic heart failure with preserved ejection fraction, PSVT, coronary stent patent, bifascicular block, trifascicular block, wound infection, urge incontinence of urine.    Examination-Activity Limitations Bathing;Bed Mobility;Bend;Caring for Others;Carry;Continence;Dressing;Hygiene/Grooming;Stairs;Squat;Reach Overhead;Locomotion  Level;Lift;Stand;Toileting;Transfers    Examination-Participation Restrictions Medication Management;Cleaning;Meal Prep;Community Activity;Driving;Laundry;Shop;Yard Work;Volunteer    Stability/Clinical Decision Making Evolving/Moderate complexity    Rehab Potential Good    PT Frequency 2x / week    PT Duration 12 weeks    PT Treatment/Interventions ADLs/Self Care Home Management;Biofeedback;Canalith Repostioning;Cryotherapy;Electrical Stimulation;Moist Heat;Traction;Ultrasound;DME Instruction;Gait training;Stair training;Functional mobility training;Therapeutic activities;Therapeutic exercise;Balance training;Neuromuscular re-education;Patient/family education;Orthotic Fit/Training;Manual techniques;Wheelchair mobility training;Passive range of motion;Dry needling;Energy conservation;Taping;Vestibular;Joint Manipulations;Spinal Manipulations;Aquatic Therapy    PT Next Visit Plan seated or standing therex as pt is able, balance    PT Home Exercise Plan no changes on this date    Consulted and Agree with Plan of Care Patient;Family member/caregiver    Family Member Consulted spouse             Patient will benefit from skilled therapeutic intervention in order to improve the following deficits and impairments:  Abnormal gait, Dizziness, Increased fascial restricitons, Impaired sensation, Improper body mechanics, Pain, Cardiopulmonary status limiting activity, Decreased coordination, Decreased mobility, Postural dysfunction, Decreased activity tolerance, Decreased endurance, Decreased range of motion, Hypomobility, Decreased strength, Decreased balance, Difficulty walking, Impaired flexibility  Visit Diagnosis: Muscle weakness (generalized)  Other abnormalities of gait and mobility  Unsteadiness on feet     Problem List Patient Active Problem List   Diagnosis Date Noted   Obstructive sleep apnea 06/29/2020   Urge incontinence of urine 12/22/2019   Trifascicular block 10/20/2019    Wound infection 10/08/2019   Bifascicular block 05/13/2019   Coronary stent patent 04/13/2019   Chronic HFrEF (heart failure with reduced ejection fraction) ([place]) 03/19/2019   PSVT (paroxysmal supraventricular tachycardia) ([place]) 03/19/2019   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 ([place]) 08/24/2018   CKD (chronic kidney disease), stage IV ([place]) 06/04/2018   Osteoarthritis of knees, bilateral 06/02/2018   Hyperlipidemia associated with type 2 diabetes mellitus ([place]) 05/06/2018   Pain of lower extremity 03/06/2018   Duodenitis 01/30/2018   Chronic heart failure with preserved ejection fraction (HFpEF) ([place]) 01/23/2018   Hematochezia    Melena    Rectal bleeding 01/11/2018   AVM (arteriovenous malformation) of colon without hemorrhage    Schatzki's ring    Esophageal dysphagia    Diabetic retinopathy ([place]) 07/08/2017   Ischemic cardiomyopathy 06/26/2017   Cardiomyopathy ([place]) 06/26/2017   Centrilobular emphysema ([place]) 06/13/2017   Fatigue 12/18/2016   Stable angina ([place]) 09/20/2016   Stage 3a chronic kidney disease ([place]) 09/20/2016   Paroxysmal atrial fibrillation ([place]) 08/28/2016   Chronic anticoagulation 08/28/2016   Essential hypertension 08/20/2016   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 heart with stable angina pectoris ([place]) 08/19/2016   Accelerating angina ([place]) 08/15/2016   Exertional dyspnea 08/12/2016   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[place]) 12/29/2015   Gynecomastia, male 03/31/2014   Hypertrophy of breast 03/31/2014   Nuclear sclerosis of left eye 10/22/2013   Enthesopathy of ankle and tarsus 04/10/2013   Controlled type 2 diabetes mellitus with diabetic nephropathy ([place]) 04/09/2013   Diabetic neuropathy associated with type 2 diabetes mellitus ([place]) 04/09/2013   Cataract of left eye 11/25/2011   Corneal opacity 09/30/2011   Pseudophakia of right eye 09/30/2011   GERD (gastroesophageal reflux disease) 09/25/2011   Gout 09/25/2011   Hyperlipidemia LDL goal  <70 09/25/2011   Status post cataract extraction 04/11/2011  Cataract extraction status of eye 04/11/2011   Iron deficiency anemia 04/01/2011   Benign localized hyperplasia of prostate without urinary obstruction and other lower urinary tract symptoms (LUTS) 12/10/2010   Enlarged prostate without lower urinary tract symptoms (luts) 12/10/2010   Colon cancer screening 10/11/2010   Hypothyroidism 10/11/2010   Hyperlipidemia 09/20/2003   Essential and other specified forms of tremor 01/05/2001   Essential tremor 01/05/2001   Ricard Dillon PT, DPT 10/11/2020, 4:48 PM  [place] MAIN [facility] SERVICES [address] [place], [place], [address] Phone: [phone]   Fax:  [phone]  Name: PAUL TRETTIN MRN: [id number] Date of Birth: [birth year]

## 2020-10-16 ENCOUNTER — Encounter: Payer: Self-pay | Admitting: Family Medicine

## 2020-10-16 ENCOUNTER — Other Ambulatory Visit: Payer: Self-pay

## 2020-10-16 ENCOUNTER — Ambulatory Visit (INDEPENDENT_AMBULATORY_CARE_PROVIDER_SITE_OTHER): Payer: Medicare HMO | Admitting: Family Medicine

## 2020-10-16 VITALS — BP 120/55 | HR 80 | Ht 63.0 in | Wt 195.2 lb

## 2020-10-16 DIAGNOSIS — I5032 Chronic diastolic (congestive) heart failure: Secondary | ICD-10-CM | POA: Diagnosis not present

## 2020-10-16 DIAGNOSIS — I10 Essential (primary) hypertension: Secondary | ICD-10-CM

## 2020-10-16 DIAGNOSIS — Z794 Long term (current) use of insulin: Secondary | ICD-10-CM

## 2020-10-16 DIAGNOSIS — E1121 Type 2 diabetes mellitus with diabetic nephropathy: Secondary | ICD-10-CM

## 2020-10-16 DIAGNOSIS — I951 Orthostatic hypotension: Secondary | ICD-10-CM | POA: Diagnosis not present

## 2020-10-16 LAB — POCT GLYCOSYLATED HEMOGLOBIN (HGB A1C): Hemoglobin A1C: 6.2 % — AB (ref 4.0–5.6)

## 2020-10-16 NOTE — Progress Notes (Addendum)
Subjective:    Patient ID: Lucas Caldwell, male    DOB: 1934-02-09, 87 y.o.   MRN: [id number]  Lucas Caldwell is a 87 y.o. male presenting on 10/16/2020 for Diabetes   HPI  Orthostatic Hypotension HTN Diastolic CHF  Doing balance / neuro PT, has episodes of low BP with drop in pressure with standing.  Reported - 10/11/20 - seated BP 114/72, Standing 87/64. Also repeat Seated 150/74 to standing 124/68  Last visit with [facility] 10/04/20, they stopped Carvedilol  Thought to have some deconditioning as well.  Today reviewed BP medications. He feels well with sitting and resting  No increase in swelling or Edema.  CHRONIC DM, Type 2 / CKD-III Nephropathy and Neuropathy Partial Ray Amputation R Great Toe Today due A1c [name] doing well with sugar now much improved Works with [facility] service Meds: - Trulicity 1.5mg  weekly inj, [name] 20u daily, Jardiance 10mg  daily True Metrix glucometer - CBG readings < 150 in AM, before breakfast usually and before bed. Checks CBG twice per day BID Reports good compliance. Tolerating well w/o side-effects Currently on ARB Lifestyle: He takes glucerna 1-2 times daily - Diet (improving DM diet, drinks some water) - Exercise (limited by dyspnea) Has DM Shoes from [employer] - Denies hypoglycemia, nausea vomiting, fever chills, swelling, polyuria   Depression screen [facility] 2/9 10/02/2020 07/28/2020 12/22/2019  Decreased Interest 0 0 0  Down, Depressed, Hopeless 0 0 0  PHQ - 2 Score 0 0 0  Altered sleeping - 2 -  Tired, decreased energy - 0 -  Change in appetite - 0 -  Feeling bad or failure about yourself  - 0 -  Trouble concentrating - 0 -  Moving slowly or fidgety/restless - 0 -  Suicidal thoughts - 0 -  PHQ-9 Score - 2 -  Difficult doing work/chores - - -  Some recent data might be hidden    Social History   Tobacco Use   Smoking status: Former    Packs/day: 1.75    Years: 20.00    Pack years: 35.00    Types:  Cigarettes    Start date: 1955    Quit date: 1975    Years since quitting: 47.6   Smokeless tobacco: Former  Scientific laboratory technician Use: Never used  Substance Use Topics   Alcohol use: No    Comment: previously drank heavily - quit 1979.   Drug use: No    Review of Systems Per HPI unless specifically indicated above     Objective:    BP (!) 120/55   Pulse 80   Ht 5\' 3"  (1.6 m)   Wt 195 lb 3.2 oz (88.5 kg)   SpO2 97%   BMI 34.58 kg/m   Wt Readings from Last 3 Encounters:  10/16/20 195 lb 3.2 oz (88.5 kg)  10/04/20 194 lb (88 kg)  09/15/20 194 lb (88 kg)    Physical Exam Vitals and nursing note reviewed.  Constitutional:      General: [name] not in acute distress.    Appearance: Normal appearance. [name] well-developed. [name] not diaphoretic.     Comments: Well-appearing, comfortable, cooperative  HENT:     Head: Normocephalic and atraumatic.  Eyes:     General:        Right eye: No discharge.        Left eye: No discharge.     Conjunctiva/sclera: Conjunctivae normal.  Cardiovascular:     Rate and  Rhythm: Normal rate.  Pulmonary:     Effort: Pulmonary effort is normal.  Skin:    General: Skin is warm and dry.     Findings: No erythema or rash.  Neurological:     Mental Status: [name] alert and oriented to person, place, and time.  Psychiatric:        Mood and Affect: Mood normal.        Behavior: Behavior normal.        Thought Content: Thought content normal.     Comments: Well groomed, good eye contact, normal speech and thoughts     Results for orders placed or performed in visit on 10/16/20  POCT HgB A1C  Result Value Ref Range   Hemoglobin A1C 6.2 (A) 4.0 - 5.6 %   *Note: Due to a large number of results and/or encounters for the requested time period, some results have not been displayed. A complete set of results can be found in Results Review.      Assessment & Plan:   Problem List Items Addressed This Visit     Orthostatic hypotension    Essential hypertension    See A&P DC Hydralazine       Controlled type 2 diabetes mellitus with diabetic nephropathy (HCC) - Primary    Controlled A1c down to 6.2 now lifestyle improving Complications - hypoglycemia (improved), CKD-III, peripheral neuropathy, cataracts, CAD  Plan:  1. Discussed DM management concern with CKD and risk of hypoglycemia - limited med options due to CKD - CONTINUE Trulicity 1.5mg  weekly - CONTINUE Jardiance 25mg  daily - Continue reduced [name] 20u, in future can continue to gradually reduce dose by 1unit per week if fasting CBG < 150 consistently - Counseling on risk of hypoglycemia and symptoms 2. Encourage improved lifestyle - keep improving diet - limit carbs, limit sweet tea, inc water - he has poor health literacy on diabetes  Future recommend reduce Insulin as tolerated / future may reconsider jardiance change or DC      Relevant Orders   POCT HgB A1C (Completed)   Chronic heart failure with preserved ejection fraction (HFpEF) (HCC)    Orthostatic Hypotension HTN Reviewed recent BP logs and noted drop with seated to standing in BP and symptomatic episodes reported.  Already recently Cardiology held Carvedilol  Today he is euvolemic  Will further recommend HTN med adjustment, will DISCONTINUE Hydralazine. 25mg  TID  Continue Losartan 25mg  daily  Also on Imdur, Jardiance, Torsemide, Tamsulosin that can impact blood pressure / orthostatics.  Will CC chart to Dr End for review of this med change as well as FYI.  ------  Also Muscle Cramps - can try OTC option [place]  No orders of the defined types were placed in this encounter.     Follow up plan: Return in about 4 months (around 02/15/2021) for 4 month Follow-up DM A1c, HTN (Hypotension) / Neuro/Cards.  [name], [facility] 10/16/2020, 3:08 PM

## 2020-10-16 NOTE — Patient Instructions (Addendum)
Thank you for coming to the office today.  STOP Hydralazine 25mg  3 times a day  Monitor your blood pressure for now, goal is to make sure your BP remains controlled off this med.  May need to add back one or two doses a day in future ONLY IF NEEDED if elevated BP >160/110 consistently.  I will forward this to [name] NP to review as well.  Keep on other medications for now.  Recent Labs    12/22/19 1610 06/14/20 1511 10/16/20 1512  HGBA1C 7.4* 7.1* 6.2*   Leg cramps - Try spoonful of yellow mustard to relieve leg cramps or try daily to prevent the problem  - OTC natural option is Hyland's Leg Cramps (Dissolving tablet) take as needed for muscle cramps   Please schedule a Follow-up Appointment to: Return in about 4 months (around 02/15/2021) for 4 month Follow-up DM A1c, HTN (Hypotension) / Neuro/Cards.  If you have any other questions or concerns, please feel free to call the office or send a message through [place]. You may also schedule an earlier appointment if necessary.  Additionally, you may be receiving a survey about your experience at our office within a few days to 1 week by e-mail or mail. We value your feedback.  [name], DO [place]

## 2020-10-16 NOTE — Assessment & Plan Note (Addendum)
Controlled A1c down to 6.2 now lifestyle improving Complications - hypoglycemia (improved), CKD-III, peripheral neuropathy, cataracts, CAD  Plan:  1. Discussed DM management concern with CKD and risk of hypoglycemia - limited med options due to CKD - CONTINUE Trulicity 1.5mg  weekly - CONTINUE Jardiance 25mg  daily - Continue reduced [name] 20u, in future can continue to gradually reduce dose by 1unit per week if fasting CBG < 150 consistently - Counseling on risk of hypoglycemia and symptoms 2. Encourage improved lifestyle - keep improving diet - limit carbs, limit sweet tea, inc water - he has poor health literacy on diabetes  Future recommend reduce Insulin as tolerated / future may reconsider jardiance change or DC

## 2020-10-16 NOTE — Assessment & Plan Note (Addendum)
See A&P DC Hydralazine

## 2020-10-17 ENCOUNTER — Ambulatory Visit: Admit: 2020-10-17 | Payer: MEDICARE

## 2020-10-18 ENCOUNTER — Other Ambulatory Visit: Payer: Self-pay

## 2020-10-18 ENCOUNTER — Ambulatory Visit: Payer: Medicare HMO | Attending: Family Medicine

## 2020-10-18 DIAGNOSIS — M6281 Muscle weakness (generalized): Secondary | ICD-10-CM | POA: Diagnosis not present

## 2020-10-18 DIAGNOSIS — R2681 Unsteadiness on feet: Secondary | ICD-10-CM | POA: Insufficient documentation

## 2020-10-18 DIAGNOSIS — R2689 Other abnormalities of gait and mobility: Secondary | ICD-10-CM | POA: Diagnosis not present

## 2020-10-18 NOTE — Therapy (Signed)
[place] MAIN [facility] SERVICES [address] [place], [place], [address] Phone: [phone]   Fax:  [phone]  Physical Therapy Treatment  Patient Details  Name: Lucas Caldwell MRN: [id number] Date of Birth: 07/22/1933 Referring Provider (PT): [name], DO   Encounter Date: 10/18/2020   PT End of Session - 10/18/20 1109     Visit Number 19    Number of Visits 49    Date for PT Re-Evaluation 12/06/20    Authorization Time Period eval performed on 06/06/2020    PT Start Time 1019    PT Stop Time 1101    PT Time Calculation (min) 42 min    Equipment Utilized During Treatment Gait belt    Activity Tolerance Patient tolerated treatment well    Behavior During Therapy [facility] for tasks assessed/performed             Past Medical History:  Diagnosis Date   Arthritis    "probably in his legs" (08/15/2016)   Blind right eye    BPH (benign prostatic hyperplasia)    Breast asymmetry    Left breast is larger, present for several years.   CAD (coronary artery disease)    a. Cath in the late 90's - reportedly ok;  b. 2014 s/p stenting x 2 @ [facility]; c 08/19/16 Cath/PCI with DES -> RCA, plan to treat LM 70% medically. Seen by surgery and felt to be too high risk for CABG; d. 08/2018 Cath: LM 70 (iFR 0.86), LAD 20ost, 40p, 50/75m D1 70, LCX patent stent, OM1 30, RCA patent stent, 441mSR, 40d, RPDA 30. PCWP 8. CO/CI 4.0/2.1; e. 03/2019 PCI to LAD (2.5x15 Resolute Onyx DES).   Chronic combined systolic (congestive) and diastolic (congestive) heart failure ([facility]   a. Previously reduced EF-->50% by echo in 2012;  b. 06/2015 Echo: EF 50-55%  c. 07/2016 Echo: EF 45-50%; d. 12/2017 Echo: EF 55%, Gr1 DD, mild MR; e. 08/2018 Echo: EF 35-40%, mildly dil PA; f. 04/2019 Echo: EF 30-35%, glob HK, nl RV fxn; g. s/p Cardiomems.   Chronic kidney disease (CKD), stage IV (severe) (HCC)    CML (chronic myelocytic leukemia) (HCC)    GERD (gastroesophageal reflux  disease)    GIB (gastrointestinal bleeding)    a. 12/2017 3 unit PRBC GIB in setting of coumadin-->Endo/colon multiple duodenal ulcers and a single bleeding ulcer in the proximal ascending colon status post hemostatic clipping x2.   Gout    Hyperlipemia    Hypertension    Hypothyroidism    Iron deficiency anemia    Ischemic cardiomyopathy    a. Previously reduced EF-->50% by echo in 2012;  b. 06/2015 Echo: EF 50-55%, Gr2 DD, mild MR, mildly dil LA, Ao sclerosis, mild TR;  c. 07/2016 Echo: EF 45-50%; d. 12/2017 Echo: EF 55%, Gr1 DD, mild MR; e. 08/2018 Echo: EF 35-40%; f. 04/2019 Echo: EF 30-35%.   Migraine    "in the 1960s" (08/15/2016)   Obstructive sleep apnea    PAF (paroxysmal atrial fibrillation) (HCC)    a. ? Dx 2014-->s/p DCCV;  b. CHA2DS2VASc = 6--> Coumadin.   Prostate cancer ([facility]   RBBB    Type II diabetes mellitus ([facility]    Past Surgical History:  Procedure Laterality Date   CATARACT EXTRACTION W/ INTRAOCULAR LENS  IMPLANT, BILATERAL Bilateral    COLONOSCOPY N/A 01/13/2018   Procedure: COLONOSCOPY;  Surgeon: VaLin LandsmanMD;  Location: [facility] ENDOSCOPY;  Service: Gastroenterology;  Laterality: N/A;   COLONOSCOPY WITH PROPOFOL N/A 01/01/2018   Procedure: COLONOSCOPY WITH PROPOFOL;  Surgeon: Lin Landsman, MD;  Location: [facility] ENDOSCOPY;  Service: Gastroenterology;  Laterality: N/A;   CORONARY ANGIOPLASTY WITH STENT PLACEMENT  09/2012   2 stents   CORONARY STENT INTERVENTION N/A 08/19/2016   Procedure: Coronary Stent Intervention;  Surgeon: [name], MD;  Location: [place] CV LAB;  Service: Cardiovascular;  Laterality: N/A;   CORONARY STENT INTERVENTION N/A 04/13/2019   Procedure: CORONARY STENT INTERVENTION;  Surgeon: [name], MD;  Location: [place] CV LAB;  Service: Cardiovascular;  Laterality: N/A;   ESOPHAGEAL MANOMETRY N/A 12/16/2017   Procedure: ESOPHAGEAL MANOMETRY (EM);  Surgeon: Lin Landsman, MD;  Location: [facility] ENDOSCOPY;   Service: Gastroenterology;  Laterality: N/A;   ESOPHAGOGASTRODUODENOSCOPY N/A 12/20/2016   Procedure: ESOPHAGOGASTRODUODENOSCOPY (EGD);  Surgeon: Lin Landsman, MD;  Location[facility] ENDOSCOPY;  Service: Gastroenterology;  Laterality: N/A;   ESOPHAGOGASTRODUODENOSCOPY N/A 01/13/2018   Procedure: ESOPHAGOGASTRODUODENOSCOPY (EGD);  Surgeon: Lin Landsman, MD;  Location: [facility] ENDOSCOPY;  Service: Gastroenterology;  Laterality: N/A;   ESOPHAGOGASTRODUODENOSCOPY (EGD) WITH PROPOFOL N/A 11/03/2017   Procedure: ESOPHAGOGASTRODUODENOSCOPY (EGD) WITH PROPOFOL;  Surgeon: Lin Landsman, MD;  Location: [facility] ENDOSCOPY;  Service: Gastroenterology;  Laterality: N/A;   EYE SURGERY     HERNIA REPAIR     "navel"   LEFT HEART CATH AND CORONARY ANGIOGRAPHY N/A 08/14/2016   Procedure: Left Heart Cath and Coronary Angiography;  Surgeon: [name], MD;  Location: [place] CV LAB;  Service: Cardiovascular;  Laterality: N/A;   PRESSURE SENSOR/CARDIOMEMS N/A 09/23/2019   Procedure: PRESSURE SENSOR/CARDIOMEMS;  Surgeon: Jolaine Artist, MD;  Location: [employer] CV LAB;  Service: Cardiovascular;  Laterality: N/A;   RIGHT HEART CATH N/A 09/23/2019   Procedure: RIGHT HEART CATH;  Surgeon: Jolaine Artist, MD;  Location: [place] CV LAB;  Service: Cardiovascular;  Laterality: N/A;   RIGHT/LEFT HEART CATH AND CORONARY ANGIOGRAPHY N/A 09/01/2018   Procedure: RIGHT/LEFT HEART CATH AND CORONARY ANGIOGRAPHY;  Surgeon: [name], MD;  Location: [place] CV LAB;  Service: Cardiovascular;  Laterality: N/A;   RIGHT/LEFT HEART CATH AND CORONARY ANGIOGRAPHY N/A 04/13/2019   Procedure: RIGHT/LEFT HEART CATH AND CORONARY ANGIOGRAPHY;  Surgeon: [name], MD;  Location: [place] CV LAB;  Service: Cardiovascular;  Laterality: N/A;   TOE AMPUTATION Right    "big toe"    There were no vitals filed for this visit.   Subjective Assessment - 10/18/20 1023     Subjective Pt presents  with spouse. Pt saw doctor earlier this week and reports had hydralazine discontinued. Pt reports no pain. Pt reports "I'm feeling real good today."  Pt reports he has been sleeping better. Pt reports no falls or near falls. Says he has had very little dizziness.    Patient is accompained by: Family member    Pertinent History Pt is an 87 y/o male presenting to physical therapy today with spouse with reports of difficulty with balance, dizziness, and walking. Pt states he started feeling "swimmy headed" approximately one year ago and that it is affecting his balance with walking and has been progressively worsening. Pt reports he feels he is moving when he's still. He denies spinning sensation. He reports he sometimes has headache but says "they are not that bad." Pt reports feeling SOB with walking and household tasks (his doctor is aware). Pt denies unintentional weight gain or loss. Pt reports 1 fall the last 6 months. Per spouse pt  tripped at time of fall and do not provide further details. Pt with extensive PMH. 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    Limitations House hold activities;Lifting;Standing;Walking;Writing   Pt reports when he tries to write his hand shakes a lot   How long can you sit comfortably? not  limited    How long can you stand comfortably? 5 minutes    How long can you walk comfortably? 10 minutes    Diagnostic tests DG Chest 2 View  from 07/06/2019 per chart: "  IMPRESSION:  No acute abnormalities."    Patient Stated Goals Feel more steady with walking    Currently in Pain? No/denies            TREATMENT  Seated BP: RUE 132/61 HR 84 bpm Standing BP: RUE102/62 hr 97 bpm, reports some dizziness upon standing up   STS - 2x6; moderate difficulty   Seated 4# AW on BLES for the following: LAQ 1x18, 1x24 BLEs; pt rates "easy" Seated marches 1x25, 1x22 pt rates "easy"   Nustep for cardioresp. and LE musuclar endurance - -level 3 x 1 min -level 4 x  2.5 min -level 3 x 2.5 min Pt rates easy-medium SPM ranged from 50s-80s CGA for mount/dismount. Monitoring pt throughout for exercise response. Reports 5/10 dizziness immediately post-exercise. After one minutes reports feeling better. Says still "just a little bit dizzy."     Neuro Re-education - CGA throughout In // bars Standing WBOS 60 sec Standing NBOS 60 sec Standing NBOS EC 60 sec increased sway Standing semi-tandem 60 sec BLEs; intermittent UE support Cone taps for SLB progression - x multiple reps, decreasing levels of UE support (from BUE support to 3 finger support). Challenging.   Pt educated throughout session about proper posture and technique with exercises. Improved exercise technique, movement at target joints, use of target muscles after min to mod verbal, visual, tactile cues.      PT Short Term Goals - 09/14/20 9935       PT SHORT TERM GOAL #1   Title Patient will be independent in home exercise program to improve strength/mobility for better functional independence with ADLs.    Baseline 06/06/2020: HEP to be initiated next visit; 5/11: Pt HEP to be updated/advanced; 6/29: not yet indep d/t prolonged absence from PT    Time 6    Period Weeks    Status On-going    Target Date 10/25/20                PT Long Term Goals - 09/14/20 7017       PT LONG TERM GOAL #1   Title Patient will increase FOTO score to equal to or greater than 54 to demonstrate statistically significant improvement in mobility and quality of life.    Baseline 06/06/2020: 46 (risk-adjusted 45); 5/11 73%    Time 12    Period Weeks    Status Achieved      PT  LONG TERM GOAL #2   Title Patient will increase 10 meter walk test to >1.40ms as to improve gait speed for better community ambulation and to reduce fall risk.    Baseline 06/06/2020: 0.59 m/s with SPC; 5/11: 0.83 m/s SPC; 6/29: 0.67 m/s with SPC    Time 12    Period Weeks    Status On-going    Target Date 12/06/20      PT LONG TERM GOAL #3   Title Patient will increase Berg Balance score by > 6 points to demonstrate decreased fall risk during functional activities.    Baseline 06/06/2020: 40/56;  5/11: 46/56; 6/29: deferred d/t time    Time 12    Period Weeks    Status Partially Met    Target Date 12/06/20      PT LONG TERM GOAL #4   Title Patient (> 673years old) will complete five times sit to stand test in < 15 seconds indicating an increased LE strength and improved balance.    Baseline 06/06/2020: 18 sec performed with BUE assist off chair; 5/11: 12 seconds using BUEs    Time 12    Period Weeks    Status Achieved      PT LONG TERM GOAL #5   Title Patient will reduce dizziness handicap inventory score by at least 18 points, for less dizziness with ADLs and increased safety with home and work tasks.    Baseline 06/06/2020: 48% (moderate handicap); 5/11: 36%; 6/29: 58%    Time 12    Period Weeks    Status On-going    Target Date 12/06/20      PT LONG TERM GOAL #6   Title Patient will increase Functional Gait Assessment score to >20/30 as to reduce fall risk and improve dynamic gait safety with community ambulation.    Baseline 06/13/2020: 15/30 5/11: deferred this session due to time; 6/29: 14/30    Time 11    Period Weeks    Status  On-going    Target Date 12/06/20              Plan - 10/18/20 1112     Clinical Impression Statement Pt with improved exercise and upright activity tolerance this session. He still exhibits BP drop with positional change (see note), but reports less dizziness and reports he just discontinued medication with doctor's visit on Monday. He was able to progress on volume and level of resistance with exercise. He reported minimal dizziness increase from his baseline with nustep that resolved after exercise. By end of session pt reported that he feels good. The pt will benefit from further skilled PT to continue to improve endurance, strength and balance to decrease fall risk.    Personal Factors and Comorbidities Age;Comorbidity 3+;Fitness;Comorbidity 1;Comorbidity 2;Past/Current Experience;Time since onset of injury/illness/exacerbation    Comorbidities Pertinent PMH/comorbidities per chart: Exertional dyspnea, centrilobular emphysema, angina, CAD, acute on chronic HFrEF, controlled DMII with diabetic nephropathy, diabetic neuropathy, gout, CKD, essential HTN, paroxysmal atrial fibrillation, chronic anticoagulation, CKD, fatigue, iron deficiency anemia, pain of LE, OA of B knees, hx of partial ray amputation of R great toe, s/p cataract extraction status of eye, enthesopathy of ankle and tarsus, GERD, hyperlipidemia, hypothyroidism, nuclear sclerosis of L eye, psedophakia of R eye, ischemic cardiomyopathy, esophageal dysphagia, chronic heart failure with preserved ejection fraction, PSVT, coronary stent patent, bifascicular block, trifascicular block, wound infection, urge incontinence of urine.    Examination-Activity Limitations Bathing;Bed Mobility;Bend;Caring for Others;Carry;Continence;Dressing;Hygiene/Grooming;Stairs;Squat;Reach Overhead;Locomotion Level;Lift;Stand;Toileting;Transfers    Examination-Participation Restrictions  Medication Management;Cleaning;Meal Prep;Community  Activity;Driving;Laundry;Shop;Yard Work;Volunteer    Stability/Clinical Decision Making Evolving/Moderate complexity    Rehab Potential Good    PT Frequency 2x / week    PT Duration 12 weeks    PT Treatment/Interventions ADLs/Self Care Home Management;Biofeedback;Canalith Repostioning;Cryotherapy;Electrical Stimulation;Moist Heat;Traction;Ultrasound;DME Instruction;Gait training;Stair training;Functional mobility training;Therapeutic activities;Therapeutic exercise;Balance training;Neuromuscular re-education;Patient/family education;Orthotic Fit/Training;Manual techniques;Wheelchair mobility training;Passive range of motion;Dry needling;Energy conservation;Taping;Vestibular;Joint Manipulations;Spinal Manipulations;Aquatic Therapy    PT Next Visit Plan seated or standing therex as pt is able, balance, continue POC    PT Home Exercise Plan no changes on this date    Consulted and Agree with Plan of Care Patient;Family member/caregiver    Family Member Consulted spouse             Patient will benefit from skilled therapeutic intervention in order to improve the following deficits and impairments:  Abnormal gait, Dizziness, Increased fascial restricitons, Impaired sensation, Improper body mechanics, Pain, Cardiopulmonary status limiting activity, Decreased coordination, Decreased mobility, Postural dysfunction, Decreased activity tolerance, Decreased endurance, Decreased range of motion, Hypomobility, Decreased strength, Decreased balance, Difficulty walking, Impaired flexibility  Visit Diagnosis: Muscle weakness (generalized)  Unsteadiness on feet  Other abnormalities of gait and mobility     Problem List Patient Active Problem List   Diagnosis Date Noted   Orthostatic hypotension 10/16/2020   Obstructive sleep apnea 06/29/2020   Urge incontinence of urine 12/22/2019   Trifascicular block 10/20/2019   Wound infection 10/08/2019   Bifascicular block 05/13/2019   Coronary stent  patent 04/13/2019   Chronic HFrEF (heart failure with reduced ejection fraction) ([place]) 03/19/2019   PSVT (paroxysmal supraventricular tachycardia) ([place]) 03/19/2019   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 ([place]) 08/24/2018   CKD (chronic kidney disease), stage IV ([place]) 06/04/2018   Osteoarthritis of knees, bilateral 06/02/2018   Hyperlipidemia associated with type 2 diabetes mellitus ([place]) 05/06/2018   Pain of lower extremity 03/06/2018   Duodenitis 01/30/2018   Chronic heart failure with preserved ejection fraction (HFpEF) ([place]) 01/23/2018   Hematochezia    Melena    Rectal bleeding 01/11/2018   AVM (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) 07/08/2017   Ischemic cardiomyopathy 06/26/2017   Cardiomyopathy ([place]) 06/26/2017   Centrilobular emphysema ([place]) 06/13/2017   Fatigue 12/18/2016   Stable angina ([place]) 09/20/2016   Stage 3a chronic kidney disease ([place]) 09/20/2016   Paroxysmal atrial fibrillation ([place]) 08/28/2016   Chronic anticoagulation 08/28/2016   Essential hypertension 08/20/2016   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 heart with stable angina pectoris ([place]) 08/19/2016   Accelerating angina ([place]) 08/15/2016   Exertional dyspnea 08/12/2016   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[place]) 12/29/2015   Gynecomastia, male 03/31/2014   Hypertrophy of breast 03/31/2014   Nuclear sclerosis of left eye 10/22/2013   Enthesopathy of ankle and tarsus 04/10/2013   Controlled type 2 diabetes mellitus with diabetic nephropathy ([place]) 04/09/2013   Diabetic neuropathy associated with type 2 diabetes mellitus ([place]) 04/09/2013   Cataract of left eye 11/25/2011   Corneal opacity 09/30/2011   Pseudophakia of right eye 09/30/2011   GERD (gastroesophageal reflux disease) 09/25/2011   Gout 09/25/2011   Hyperlipidemia LDL goal <70 09/25/2011   Status post cataract extraction 04/11/2011   Cataract  extraction status of eye 04/11/2011   Iron deficiency anemia 04/01/2011   Benign localized hyperplasia of prostate without urinary obstruction and other lower urinary tract symptoms (LUTS) 12/10/2010   Enlarged  prostate without lower urinary tract symptoms (luts) 12/10/2010   Colon cancer screening 10/11/2010   Hypothyroidism 10/11/2010   Hyperlipidemia 09/20/2003   Essential and other specified forms of tremor 01/05/2001   Essential tremor 01/05/2001   Ricard Dillon PT, DPT 10/18/2020, 11:15 AM  [place] [facility] MAIN [facility] SERVICES [address] [place], [place], [address] Phone: [phone]   Fax:  [phone]  Name: PAULMICHAEL SCHRECK MRN: [id number] Date of Birth: 1933/09/07

## 2020-10-21 ENCOUNTER — Other Ambulatory Visit: Payer: Self-pay | Admitting: Family Medicine

## 2020-10-21 DIAGNOSIS — E1142 Type 2 diabetes mellitus with diabetic polyneuropathy: Secondary | ICD-10-CM

## 2020-10-21 NOTE — Telephone Encounter (Signed)
Requested medication (s) are due for refill today: no  Requested medication (s) are on the active medication list: yes  Last refill:  09/08/20  Future visit scheduled: no  Notes to clinic:  med not delegated to NT to RF   Requested Prescriptions  Pending Prescriptions Disp Refills   traMADol (ULTRAM) 50 MG tablet [Pharmacy Med Name: tramadol 50 mg tablet] 30 tablet 2    Sig: TAKE 1 OR 2 TABLETS BY MOUTH AT BEDTIME AS NEEDED      Not Delegated - Analgesics:  Opioid Agonists Failed - 10/21/2020 10:02 AM      Failed - This refill cannot be delegated      Failed - Urine Drug Screen completed in last 360 days      Passed - Valid encounter within last 6 months    Recent Outpatient Visits           5 days ago Controlled type 2 diabetes mellitus with diabetic nephropathy, with long-term current use of insulin ([place])   [facility] [name], DO   2 months ago Acute diastolic congestive heart failure [facility])   [place], DO   2 months ago Acute non-recurrent frontal sinusitis   [place], DO   4 months ago Controlled type 2 diabetes mellitus with diabetic nephropathy, with long-term current use of insulin [facility])   [facility] [place], [name], DO   5 months ago Balance disorder   [place], [name], DO       Future Appointments             In 2 weeks Sharolyn Douglas, Clance Boll, NP [facility], [place]   In 10 months Debroah Loop, [place]

## 2020-10-25 ENCOUNTER — Ambulatory Visit: Payer: Medicare HMO

## 2020-10-25 ENCOUNTER — Other Ambulatory Visit: Payer: Self-pay

## 2020-10-25 DIAGNOSIS — R2689 Other abnormalities of gait and mobility: Secondary | ICD-10-CM

## 2020-10-25 DIAGNOSIS — M6281 Muscle weakness (generalized): Secondary | ICD-10-CM

## 2020-10-25 DIAGNOSIS — R2681 Unsteadiness on feet: Secondary | ICD-10-CM

## 2020-10-25 NOTE — Therapy (Addendum)
Patient DC Summary  Pt arrived late for session, caught in traffic. Discussed recent reassessment, achievement in 3 LT goals and progress toward 3 others. Discussed recent workup with PCP, cardiology, and neurology to address ongoing orthostatic hypotension and concerns of any medications that may be contributing to falls. Plan is for patient to be DC from PT services at this time while medical team continues to fine tune medications. Pt is pleased with his progress and feels ready for DC. He denies any needed HEP updates at this time. Pt encouraged to obtain a new referral if he has therapy needs in the future.   5:35 PM, 11/07/20 Etta Grandchild, PT, DPT Physical Therapist - [facility]  Outpatient Physical Therapy- [place] MAIN [facility] SERVICES [address] [place], [place], [address] Phone: [phone]   Fax:  [phone]  Physical Therapy Treatment/Physical Therapy Progress Note   Dates of reporting period  07/26/2020   to   10/25/2020 VISIT 20  Patient Details  Name: Lucas Caldwell MRN: [id number] Date of Birth: 1934/01/08 Referring Provider (PT): [name], DO   Encounter Date: 10/25/2020   PT End of Session - 10/25/20 1209     Visit Number 20    Number of Visits 49    Date for PT Re-Evaluation 12/06/20    Authorization Time Period eval performed on 06/06/2020    PT Start Time 1020    PT Stop Time 1100    PT Time Calculation (min) 40 min    Equipment Utilized During Treatment Gait belt    Activity Tolerance Patient tolerated treatment well    Behavior During Therapy WFL for tasks assessed/performed             Past Medical History:  Diagnosis Date   Arthritis    "probably in his legs" (08/15/2016)   Blind right eye    BPH (benign prostatic hyperplasia)    Breast asymmetry    Left breast is larger, present for several years.   CAD  (coronary artery disease)    a. Cath in the late 90's - reportedly ok;  b. 2014 s/p stenting x 2 @ [facility]; c 08/19/16 Cath/PCI with DES -> RCA, plan to treat LM 70% medically. Seen by surgery and felt to be too high risk for CABG; d. 08/2018 Cath: LM 70 (iFR 0.86), LAD 20ost, 40p, 50/61m, D1 70, LCX patent stent, OM1 30, RCA patent stent, 74m ISR, 40d, RPDA 30. PCWP 8. CO/CI 4.0/2.1; e. 03/2019 PCI to LAD (2.5x15 Resolute Onyx DES).   Chronic combined systolic (congestive) and diastolic (congestive) heart failure ([place])    a. Previously reduced EF-->50% by echo in 2012;  b. 06/2015 Echo: EF 50-55%  c. 07/2016 Echo: EF 45-50%; d. 12/2017 Echo: EF 55%, Gr1 DD, mild MR; e. 08/2018 Echo: EF 35-40%, mildly dil PA; f. 04/2019 Echo: EF 30-35%, glob HK, nl RV fxn; g. s/p Cardiomems.   Chronic kidney disease (CKD), stage IV (severe) (HCC)    CML (chronic myelocytic leukemia) (HCC)    GERD (gastroesophageal reflux disease)    GIB (gastrointestinal bleeding)    a. 12/2017 3 unit PRBC GIB in setting of coumadin-->Endo/colon multiple duodenal ulcers and a single bleeding ulcer in the proximal ascending colon status post hemostatic clipping x2.   Gout    Hyperlipemia    Hypertension    Hypothyroidism    Iron deficiency anemia  Ischemic cardiomyopathy    a. Previously reduced EF-->50% by echo in 2012;  b. 06/2015 Echo: EF 50-55%, Gr2 DD, mild MR, mildly dil LA, Ao sclerosis, mild TR;  c. 07/2016 Echo: EF 45-50%; d. 12/2017 Echo: EF 55%, Gr1 DD, mild MR; e. 08/2018 Echo: EF 35-40%; f. 04/2019 Echo: EF 30-35%.   Migraine    "in the 1960s" (08/15/2016)   Obstructive sleep apnea    PAF (paroxysmal atrial fibrillation) (HCC)    a. ? Dx 2014-->s/p DCCV;  b. CHA2DS2VASc = 6--> Coumadin.   Prostate cancer ([place])    RBBB    Type II diabetes mellitus ([place])     Past Surgical History:  Procedure Laterality Date   CATARACT EXTRACTION W/ INTRAOCULAR LENS  IMPLANT, BILATERAL Bilateral    COLONOSCOPY N/A 01/13/2018   Procedure:  COLONOSCOPY;  Surgeon: Lin Landsman, MD;  Location: [facility] ENDOSCOPY;  Service: Gastroenterology;  Laterality: N/A;   COLONOSCOPY WITH PROPOFOL N/A 01/01/2018   Procedure: COLONOSCOPY WITH PROPOFOL;  Surgeon: Lin Landsman, MD;  Location: [facility] ENDOSCOPY;  Service: Gastroenterology;  Laterality: N/A;   CORONARY ANGIOPLASTY WITH STENT PLACEMENT  09/2012   2 stents   CORONARY STENT INTERVENTION N/A 08/19/2016   Procedure: Coronary Stent Intervention;  Surgeon: [name], MD;  Location: [place] CV LAB;  Service: Cardiovascular;  Laterality: N/A;   CORONARY STENT INTERVENTION N/A 04/13/2019   Procedure: CORONARY STENT INTERVENTION;  Surgeon: [name], MD;  Location: [place] CV LAB;  Service: Cardiovascular;  Laterality: N/A;   ESOPHAGEAL MANOMETRY N/A 12/16/2017   Procedure: ESOPHAGEAL MANOMETRY (EM);  Surgeon: Lin Landsman, MD;  Location: [facility] ENDOSCOPY;  Service: Gastroenterology;  Laterality: N/A;   ESOPHAGOGASTRODUODENOSCOPY N/A 12/20/2016   Procedure: ESOPHAGOGASTRODUODENOSCOPY (EGD);  Surgeon: Lin Landsman, MD;  Location: [facility] ENDOSCOPY;  Service: Gastroenterology;  Laterality: N/A;   ESOPHAGOGASTRODUODENOSCOPY N/A 01/13/2018   Procedure: ESOPHAGOGASTRODUODENOSCOPY (EGD);  Surgeon: Lin Landsman, MD;  Location: [facility] ENDOSCOPY;  Service: Gastroenterology;  Laterality: N/A;   ESOPHAGOGASTRODUODENOSCOPY (EGD) WITH PROPOFOL N/A 11/03/2017   Procedure: ESOPHAGOGASTRODUODENOSCOPY (EGD) WITH PROPOFOL;  Surgeon: Lin Landsman, MD;  Location[facility] END[facility]COPY;  Service: Gastroenterology;  Laterality: N/A;   EYE SURGERY     HERNIA REPAIR     "navel"   LEFT HEART CATH AND CORONARY ANGIOGRAPHY N/A 08/14/2016   Procedure: Left Heart Cath and Coronary Angiography;  Surgeon: [name], MD;  Location: [place] CV LAB;  Service: Cardiovascular;  Laterality: N/A;   PRESSURE SENSOR/CARDIOMEMS N/A 09/23/2019   Procedure: PRESSURE SENSOR/CARDIOMEMS;   Surgeon: Jolaine Artist, MD;  Location: [place] CV LAB;  Service: Cardiovascular;  Laterality: N/A;   RIGHT HEART CATH N/A 09/23/2019   Procedure: RIGHT HEART CATH;  Surgeon: Jolaine Artist, MD;  Location: [place] CV LAB;  Service: Cardiovascular;  Laterality: N/A;   RIGHT/LEFT HEART CATH AND CORONARY ANGIOGRAPHY N/A 09/01/2018   Procedure: RIGHT/LEFT HEART CATH AND CORONARY ANGIOGRAPHY;  Surgeon: [name], MD;  Location: [place] CV LAB;  Service: Cardiovascular;  Laterality: N/A;   RIGHT/LEFT HEART CATH AND CORONARY ANGIOGRAPHY N/A 04/13/2019   Procedure: RIGHT/LEFT HEART CATH AND CORONARY ANGIOGRAPHY;  Surgeon: [name], MD;  Location: [place] CV LAB;  Service: Cardiovascular;  Laterality: N/A;   TOE AMPUTATION Right    "big toe"    There were no vitals filed for this visit.   Subjective Assessment - 10/25/20 1222     Subjective Pt reports he has been feeling "really dizzy" lately. He reports lightheadedness  when he first stands up in the morning and says it initially affects his walking. He reports dizziness with bending over. Pt reports he has doctor's appointment tomorrow.    Patient is accompained by: Family member    Pertinent History Pt is an 87 y/o male presenting to physical therapy today with spouse with reports of difficulty with balance, dizziness, and walking. Pt states he started feeling "swimmy headed" approximately one year ago and that it is affecting his balance with walking and has been progressively worsening. Pt reports he feels he is moving when he's still. He denies spinning sensation. He reports he sometimes has headache but says "they are not that bad." Pt reports feeling SOB with walking and household tasks (his doctor is aware). Pt denies unintentional weight gain or loss. Pt reports 1 fall the last 6 months. Per spouse pt tripped at time of fall and do not provide further details. Pt with extensive PMH. Relevant PMH per chart:  Exertional dyspnea, centrilobular emphysema, angina, CAD, acute on chronic HFrEF, controlled DMII with diabetic nephropathy, diabetic neuropathy,  gout, CKD, essential HTN, paroxysmal atrial fibrillation, chronic anticoagulation, CKD, fatigue, iron deficiency anemia, pain of LE, OA of B knees, hx of partial ray amputation of R great toe, s/p cataract extraction status of eye, enthesopathy of ankle and tarsus, GERD, hyperlipidemia, hypothyroidism, nuclear sclerosis of L eye, psedophakia of R eye, ischemic cardiomyopathy, esophageal dysphagia, chronic heart failure with preserved ejection fraction, PSVT, coronary stent patent, bifascicular block, trifascicular block, wound infection, urge incontinence of urine.    Limitations House hold activities;Lifting;Standing;Walking;Writing   Pt reports when he tries to write his hand shakes a lot   How long can you sit comfortably? not limited    How long can you stand comfortably? 5 minutes    How long can you walk comfortably? 10 minutes    Diagnostic tests DG Chest 2 View  from 07/06/2019 per chart: "  IMPRESSION:  No acute abnormalities."    Patient Stated Goals Feel more steady with walking    Currently in Pain? No/denies                [facility] PT Assessment - 10/25/20 0001       Berg Balance Test   Sit to Stand Able to stand without using hands and stabilize independently    Standing Unsupported Able to stand safely 2 minutes    Sitting with Back Unsupported but Feet Supported on Floor or Stool Able to sit safely and securely 2 minutes    Stand to Sit Sits safely with minimal use of hands    Transfers Able to transfer safely, minor use of hands    Standing Unsupported with Eyes Closed Able to stand 10 seconds safely    Standing Unsupported with Feet Together Able to place feet together independently and stand for 1 minute with supervision    From Standing, Reach Forward with Outstretched Arm Can reach forward >12 cm safely (5")    From Standing  Position, Pick up Object from Floor Able to pick up shoe, needs supervision    From Standing Position, Turn to Look Behind Over each Shoulder Looks behind from both sides and weight shifts well    Turn 360 Degrees Able to turn 360 degrees safely in 4 seconds or less    Standing Unsupported, Alternately Place Feet on Step/Stool Able to stand independently and complete 8 steps >20 seconds    Standing Unsupported, One Foot in Front Able to plae foot  ahead of the other independently and hold 30 seconds    Standing on One Leg Able to lift leg independently and hold equal to or more than 3 seconds    Total Score 49              TREATMENT - RETESTING of goals for progress note (see goal section for details)   Seated BP: LUE 127/61 HR 91 bpm Standing BP: LUE 105/68 hr 105 bpm  Provides handout of BP readings per pt request to take to upcoming doctor's appointment  10MWT 0.74 m/s   BERG 49/56  FOTO -42   DHI 62%  FGA: deferred   Patient's condition has the potential to improve in response to therapy. Maximum improvement is yet to be obtained. The anticipated improvement is attainable and reasonable in a generally predictable time.  Patient reports       PT Education - 10/25/20 1206     Education Details Findings of retesting, indications for PT/POC, exercise technique, body mechanics    Person(s) Educated Patient    Methods Explanation;Demonstration;Verbal cues;Tactile cues    Comprehension Verbalized understanding;Returned demonstration              PT Short Term Goals - 10/25/20 1210       PT SHORT TERM GOAL #1   Title Patient will be independent in home exercise program to improve strength/mobility for better functional independence with ADLs.    Baseline 06/06/2020: HEP to be initiated next visit; 5/11: Pt HEP to be updated/advanced; 6/29: not yet indep d/t prolonged absence from PT 8/10: pt has been performing some of his HEP, seated marches, some walking    Time 6     Period Weeks    Status On-going    Target Date 12/06/20               PT Long Term Goals - 10/25/20 1211       PT LONG TERM GOAL #1   Title Patient will increase FOTO score to equal to or greater than 54 to demonstrate statistically significant improvement in mobility and quality of life.    Baseline 06/06/2020: 46 (risk-adjusted 45); 5/11 73%    Time 12    Period Weeks    Status Achieved      PT LONG TERM GOAL #2   Title Patient will increase 10 meter walk test to >1.78m/s as to improve gait speed for better community ambulation and to reduce fall risk.    Baseline 06/06/2020: 0.59 m/s with SPC; 5/11: 0.83 m/s SPC; 6/29: 0.67 m/s with SPC; 8/10: 0.74 m/s    Time 12    Period Weeks    Status On-going    Target Date 12/06/20      PT LONG TERM GOAL #3   Title Patient will increase Berg Balance score by > 6 points to demonstrate decreased fall risk during functional activities.    Baseline 06/06/2020: 40/56;  5/11: 46/56; 6/29: deferred d/t time; 49/56    Time 12    Period Weeks    Status Achieved      PT LONG TERM GOAL #4   Title Patient (> 47 years old) will complete five times sit to stand test in < 15 seconds indicating an increased LE strength and improved balance.    Baseline 06/06/2020: 18 sec performed with BUE assist off chair; 5/11: 12 seconds using BUEs    Time 12    Period Weeks    Status Achieved  PT LONG TERM GOAL #5   Title Patient will reduce dizziness handicap inventory score by at least 18 points, for less dizziness with ADLs and increased safety with home and work tasks.    Baseline 06/06/2020: 48% (moderate handicap); 5/11: 36%; 6/29: 58%; 8/10: 62    Time 12    Period Weeks    Status On-going    Target Date 12/06/20      PT LONG TERM GOAL #6   Title Patient will increase Functional Gait Assessment score to >20/30 as to reduce fall risk and improve dynamic gait safety with community ambulation.    Baseline 06/13/2020: 15/30 5/11: deferred this  session due to time; 6/29: 14/30; 8/10: deferred    Time 11    Period Weeks    Status On-going    Target Date 12/06/20                   Plan - 10/25/20 1218     Clinical Impression Statement Goals reassessed for progress note. Pt achieved Berg goal, indicating improved balance. Pt also improved on 10MWT, indicating increased gait speed. Although pt shows some progress, he did see a decrease on his FOTO score and an increase in perceived handicap as evidenced by [place] score. These changes may in part be influenced by recent increase in pt dizziness. Pt has been working with his doctor regarding his BP medications as pt is still experiencing significant drop in BP with positional changes. Patient's condition has the potential to improve in response to therapy. Maximum improvement is yet to be obtained. The anticipated improvement is attainable and reasonable in a generally predictable time. The pt will benefit from further skilled PT to improve his balance, strength and gait ability to decrease fall risk.    Personal Factors and Comorbidities Age;Comorbidity 3+;Fitness;Comorbidity 1;Comorbidity 2;Past/Current Experience;Time since onset of injury/illness/exacerbation    Comorbidities Pertinent PMH/comorbidities per chart: Exertional dyspnea, centrilobular emphysema, angina, CAD, acute on chronic HFrEF, controlled DMII with diabetic nephropathy, diabetic neuropathy, gout, CKD, essential HTN, paroxysmal atrial fibrillation, chronic anticoagulation, CKD, fatigue, iron deficiency anemia, pain of LE, OA of B knees, hx of partial ray amputation of R great toe, s/p cataract extraction status of eye, enthesopathy of ankle and tarsus, GERD, hyperlipidemia, hypothyroidism, nuclear sclerosis of L eye, psedophakia of R eye, ischemic cardiomyopathy, esophageal dysphagia, chronic heart failure with preserved ejection fraction, PSVT, coronary stent patent, bifascicular block, trifascicular block, wound infection,  urge incontinence of urine.    Examination-Activity Limitations Bathing;Bed Mobility;Bend;Caring for Others;Carry;Continence;Dressing;Hygiene/Grooming;Stairs;Squat;Reach Overhead;Locomotion Level;Lift;Stand;Toileting;Transfers    Examination-Participation Restrictions Medication Management;Cleaning;Meal Prep;Community Activity;Driving;Laundry;Shop;Yard Work;Volunteer    Stability/Clinical Decision Making Evolving/Moderate complexity    Rehab Potential Good    PT Frequency 2x / week    PT Duration 12 weeks    PT Treatment/Interventions ADLs/Self Care Home Management;Biofeedback;Canalith Repostioning;Cryotherapy;Electrical Stimulation;Moist Heat;Traction;Ultrasound;DME Instruction;Gait training;Stair training;Functional mobility training;Therapeutic activities;Therapeutic exercise;Balance training;Neuromuscular re-education;Patient/family education;Orthotic Fit/Training;Manual techniques;Wheelchair mobility training;Passive range of motion;Dry needling;Energy conservation;Taping;Vestibular;Joint Manipulations;Spinal Manipulations;Aquatic Therapy    PT Next Visit Plan seated or standing therex as pt is able, balance, continue POC    PT Home Exercise Plan no changes on this date    Consulted and Agree with Plan of Care Patient;Family member/caregiver    Family Member Consulted spouse             Patient will benefit from skilled therapeutic intervention in order to improve the following deficits and impairments:  Abnormal gait, Dizziness, Increased fascial restricitons, Impaired sensation, Improper body mechanics, Pain, Cardiopulmonary status limiting  activity, Decreased coordination, Decreased mobility, Postural dysfunction, Decreased activity tolerance, Decreased endurance, Decreased range of motion, Hypomobility, Decreased strength, Decreased balance, Difficulty walking, Impaired flexibility  Visit Diagnosis: Unsteadiness on feet  Muscle weakness (generalized)  Other abnormalities of gait  and mobility     Problem List Patient Active Problem List   Diagnosis Date Noted   Orthostatic hypotension 10/16/2020   Obstructive sleep apnea 06/29/2020   Urge incontinence of urine 12/22/2019   Trifascicular block 10/20/2019   Wound infection 10/08/2019   Bifascicular block 05/13/2019   Coronary stent patent 04/13/2019   Chronic HFrEF (heart failure with reduced ejection fraction) ([place]) 03/19/2019   PSVT (paroxysmal supraventricular tachycardia) ([place]) 03/19/2019   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 ([place]) 08/24/2018   CKD (chronic kidney disease), stage IV ([place]) 06/04/2018   Osteoarthritis of knees, bilateral 06/02/2018   Hyperlipidemia associated with type 2 diabetes mellitus ([place]) 05/06/2018   Pain of lower extremity 03/06/2018   Duodenitis 01/30/2018   Chronic heart failure with preserved ejection fraction (HFpEF) ([place]) 01/23/2018   Hematochezia    Melena    Rectal bleeding 01/11/2018   AVM (arteriovenous malformation) of colon without hemorrhage    Schatzki's ring    Esophageal dysphagia    Diabetic retinopathy ([place]) 07/08/2017   Ischemic cardiomyopathy 06/26/2017   Cardiomyopathy ([place]) 06/26/2017   Centrilobular emphysema ([place]) 06/13/2017   Fatigue 12/18/2016   Stable angina (HCC) 09/20/2016   Stage 3a chronic kidney disease ([place]) 09/20/2016   Paroxysmal atrial fibrillation ([place]) 08/28/2016   Chronic anticoagulation 08/28/2016   Essential hypertension 08/20/2016   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 heart with stable angina pectoris ([place]) 08/19/2016   Accelerating angina ([place]) 08/15/2016   Exertional dyspnea 08/12/2016   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[place]) 12/29/2015   Gynecomastia, male 03/31/2014   Hypertrophy of breast 03/31/2014   Nuclear sclerosis of left eye 10/22/2013   Enthesopathy of ankle and tarsus 04/10/2013   Controlled type 2 diabetes mellitus with diabetic nephropathy ([place]) 04/09/2013   Diabetic neuropathy associated with type  2 diabetes mellitus ([place]) 04/09/2013   Cataract of left eye 11/25/2011   Corneal opacity 09/30/2011   Pseudophakia of right eye 09/30/2011   GERD (gastroesophageal reflux disease) 09/25/2011   Gout 09/25/2011   Hyperlipidemia LDL goal <70 09/25/2011   Status post cataract extraction 04/11/2011   Cataract extraction status of eye 04/11/2011   Iron deficiency anemia 04/01/2011   Benign localized hyperplasia of prostate without urinary obstruction and other lower urinary tract symptoms (LUTS) 12/10/2010   Enlarged prostate without lower urinary tract symptoms (luts) 12/10/2010   Colon cancer screening 10/11/2010   Hypothyroidism 10/11/2010   Hyperlipidemia 09/20/2003   Essential and other specified forms of tremor 01/05/2001   Essential tremor 01/05/2001   Ricard Dillon PT, DPT  10/25/2020, 12:23 PM  [facility] MAIN [facility] SERVICES [address] [place], [place], [address] Phone: [phone]   Fax:  [phone]  Name: Lucas Caldwell MRN: [id number] Date of Birth: [birth year]

## 2020-10-26 ENCOUNTER — Ambulatory Visit (INDEPENDENT_AMBULATORY_CARE_PROVIDER_SITE_OTHER): Payer: Medicare HMO

## 2020-10-26 DIAGNOSIS — I5022 Chronic systolic (congestive) heart failure: Secondary | ICD-10-CM | POA: Diagnosis not present

## 2020-10-26 MED ORDER — PERFLUTREN LIPID MICROSPHERE
1.0000 mL | INTRAVENOUS | Status: AC | PRN
  Administered 2020-10-26: 3 mL via INTRAVENOUS

## 2020-10-27 ENCOUNTER — Other Ambulatory Visit: Payer: Self-pay | Admitting: Family Medicine

## 2020-10-27 ENCOUNTER — Other Ambulatory Visit: Payer: Self-pay | Admitting: Internal Medicine

## 2020-10-27 DIAGNOSIS — E039 Hypothyroidism, unspecified: Secondary | ICD-10-CM

## 2020-10-27 LAB — ECHOCARDIOGRAM COMPLETE
AR max vel: 2.83 cm2
AV Area VTI: 2.76 cm2
AV Area mean vel: 2.61 cm2
AV Mean grad: 3 mmHg
AV Peak grad: 5 mmHg
Ao pk vel: 1.12 m/s
Area-P 1/2: 10.25 cm2
Calc EF: 41.3 %
MV VTI: 2.6 cm2
S' Lateral: 2.9 cm
Single Plane A2C EF: 43.2 %
Single Plane A4C EF: 41.5 %

## 2020-10-27 NOTE — Telephone Encounter (Signed)
Requested medication (s) are due for refill today: no  Requested medication (s) are on the active medication list:  yes   Last refill:  09/28/2020  Future visit scheduled: yes  Notes to clinic:  Failed protocol: TSH needs to be rechecked within 3 months after an abnormal result. Refill until TSH is due   Requested Prescriptions  Pending Prescriptions Disp Refills   levothyroxine (SYNTHROID) 25 MCG tablet [Pharmacy Med Name: levothyroxine 25 mcg tablet] 90 tablet 0    Sig: TAKE ONE TABLET BY MOUTH EVERY MORNING TAKE BEFORE BREAKFAST     Endocrinology:  Hypothyroid Agents Failed - 10/27/2020 11:28 AM      Failed - TSH needs to be rechecked within 3 months after an abnormal result. Refill until TSH is due.      Failed - TSH in normal range and within 360 days    TSH  Date Value Ref Range Status  05/04/2019 3.850 0.450 - 4.500 uIU/mL Final          Passed - Valid encounter within last 12 months    Recent Outpatient Visits           1 week ago Controlled type 2 diabetes mellitus with diabetic nephropathy, with long-term current use of insulin ([place])   [facility] [place], [name], DO   2 months ago Acute diastolic congestive heart failure [facility])   [place], DO   3 months ago Acute non-recurrent frontal sinusitis   [place], DO   4 months ago Controlled type 2 diabetes mellitus with diabetic nephropathy, with long-term current use of insulin [facility])   [facility] [place], [name], DO   5 months ago Balance disorder   [place], [name], DO       Future Appointments             In 1 week Sharolyn Douglas, Clance Boll, NP [facility] [place]   In 10 months Debroah Loop, [place]

## 2020-10-30 ENCOUNTER — Telehealth: Payer: Self-pay | Admitting: Family Medicine

## 2020-10-30 ENCOUNTER — Ambulatory Visit: Payer: Self-pay

## 2020-10-30 DIAGNOSIS — R42 Dizziness and giddiness: Secondary | ICD-10-CM

## 2020-10-30 DIAGNOSIS — R2689 Other abnormalities of gait and mobility: Secondary | ICD-10-CM

## 2020-10-30 NOTE — Telephone Encounter (Signed)
Christine from [facility] states that will need a new referral to see patient. She states that this is a different issues for what they previously saw him for.

## 2020-10-30 NOTE — Addendum Note (Signed)
Addended by: [name] on: 10/30/2020 02:27 PM   Modules accepted: Orders

## 2020-10-30 NOTE — Telephone Encounter (Signed)
Pt's wife had previously notified office and PCP advised her to call [phone]. Called and spoke with wife. Stated she has not called the number. Informed that number is to his neurologist Dr. Carles Collet. Wife asked if I could transfer call. Called office and LM on VM with message and gave wife's name and number and pt's name and DOB.  Pt has a  h/o  dizziness, tremors. Was last seen by Dr Tat 02/2020. Advised pt's wife to discuss with Dr Tat and if she feels she needs to take him to ED to get needed testing done sooner.   Please TE. Gave wife the contact number if she needs anything further- pt was not at home during call "he is out with friends."      Reason for Disposition  [1] Follow-up call to recent contact AND [2] information only call, no triage required    Pt not home  Answer Assessment - Initial Assessment Questions 1. REASON FOR CALL or QUESTION: "What is your reason for calling today?" or "How can I best help you?" or "What question do you have that I can help answer?"     Pt's wife concerned about pt dizziness and falls - wife wants pt to get a head CT.  Protocols used: Information Only Call - No Triage-A-AH

## 2020-10-30 NOTE — Telephone Encounter (Signed)
Referral in now to [facility] Dr Tat  [name], [facility] 10/30/2020, 2:27 PM

## 2020-10-30 NOTE — Telephone Encounter (Signed)
Patient wife would like patient to be seen today for continued dizziness and leg pain. Patient was offered first appt on 11/09/2020 but wants to be seen today. Also offered to let patient be triage by a nurse and they refused.

## 2020-10-30 NOTE — Telephone Encounter (Signed)
He has had long history of dizziness balance issues. I am not quite sure if this is new.  Yes we would typically do an evaluation before ordering head CT, I usually don't order too many CT regardless if they are worried about something worse or more urgent that should be done in Hospital instead.  He has been followed by [facility] in [place] has seen Dr Wells Guiles Tat before.  If he wanted to follow-up with them instead that is fine and they may be able to order any CT imaging and discuss dizziness further  But if worse or new concern, then hospital ED is best option to get the scan they would want.  [name], [facility] 10/30/2020, 9:08 AM

## 2020-10-31 ENCOUNTER — Telehealth: Payer: Self-pay | Admitting: *Deleted

## 2020-10-31 ENCOUNTER — Ambulatory Visit: Payer: Medicare HMO

## 2020-10-31 NOTE — Telephone Encounter (Signed)
Spoke with pt's wife, notified of results and Dr. Darnelle Bos recc (DPR approved).  Wife voiced understanding. No further questions at this time.  Pt will follow up as scheduled 11/08/20.

## 2020-10-31 NOTE — Telephone Encounter (Signed)
-----   Message from [name], MD sent at 10/30/2020  7:04 AM EDT ----- Please let Lucas Caldwell know that his echocardiogram shows that his heart remains weak, unchanged compared with 04/2019.  I recommend that he continue his current medications and follow-up as previously arranged.

## 2020-11-01 ENCOUNTER — Ambulatory Visit (INDEPENDENT_AMBULATORY_CARE_PROVIDER_SITE_OTHER): Payer: Medicare HMO | Admitting: Pharmacist

## 2020-11-01 DIAGNOSIS — E1121 Type 2 diabetes mellitus with diabetic nephropathy: Secondary | ICD-10-CM

## 2020-11-01 DIAGNOSIS — I10 Essential (primary) hypertension: Secondary | ICD-10-CM

## 2020-11-01 NOTE — Patient Instructions (Signed)
Visit Information  PATIENT GOALS:  Goals Addressed             This Visit's Progress    Pharmacy Goals       Please check your blood sugar, keep a log of the results and bring this with you to your medical appointments.  Please check your home blood pressure, keep a log of the results and bring this with you to your medical appointments.  Our goal bad cholesterol, or LDL, is less than 70 . This is why it is important to continue taking your rosuvastatin.  Feel free to call me with any questions or concerns. I look forward to our next call!   [name], PharmD, [place] [phone]        The patient verbalized understanding of instructions, educational materials, and care plan provided today and declined offer to receive copy of patient instructions, educational materials, and care plan.   Telephone follow up appointment with care management team member scheduled for: 11/15/2020 at 10:00 AM

## 2020-11-01 NOTE — Progress Notes (Signed)
Assessment/Plan:    Orthostatic hypotension  -Followed by cardiology.  Last seen by cardiology in July, 2022, at which point carvedilol was discontinued (primarily to see if that was contributing to nightmares.  -Patient mildly orthostatic in the office today.  Was able to reproduce symptoms by doing orthostatics.  Was orthostatic at rehab.  Discussed with patient and wife that he is on several medications that could be contributing, including losartan, Imdur, tamsulosin, Demadex.  However, I told the patient that he also may need these medications for other conditions (including kidney protection).  Patient and I talked about using compression stockings.  He has them at home but does not use them.  Encouraged him to start using them.  We discussed hydration (states that he is not on a fluid restriction by nephrology).  -pt has upcoming appt with cardiology.  Will address with them and pcp.  Will send message.  -neuro exam nonfocal and nonlateralizing.  Does have some evidence of diabetic peripheral neuropathy, but that is stable and he has been in physical therapy.   Subjective:   Lucas Caldwell was seen today in follow up for balance issues.  My previous records as well as any outside records available were reviewed prior to todays visit.  Pt previously seen 1 time at our clinic in December, 2021 for tremor and neuropathy.  At that time, gait issues were felt to be multifactorial, due to peripheral neuropathy (diabetic), but also due to degenerative joint changes.  He also had a history of essential tremor.  We decided to hold off on primidone at the time.  His cardiologist was okay with stopping the Ranexa (interfered with the primidone) that I did not think that the benefits of the primidone really outweigh the risks at the time.  More recently, patient saw primary care physician on August 1.  He has been attending physical therapy for balance.  He reported that he was having orthostatic  symptoms when standing.  Cardiology stopped his carvedilol in July.  This was primarily because he thought it was causing nightmares.  Cardiology thought that he was deconditioned and felt that this was playing a big role in his fatigue and dyspnea.  Pt states "when I first get up in the AM I have to sit there."  In addition, if he first gets up, he feels dizzy and "I have to walk with my buddy (cane)."  States that his dizziness is what causes him to stagger.  The dizziness is a spinning sensation.  It has been going on 5-6 months.  Didn't think that the dizziness changed with stopping the carvedilol.     CURRENT MEDICATIONS:  Outpatient Encounter Medications as of 11/02/2020  Medication Sig   acetaminophen (TYLENOL) 500 MG tablet Take 1,000 mg by mouth every 6 (six) hours as needed for moderate pain or headache.    Alcohol Swabs (B-D SINGLE USE SWABS REGULAR) PADS Use to check blood sugar up to 2 times daily   allopurinol (ZYLOPRIM) 100 MG tablet Take 1 tablet (100 mg total) by mouth daily.   aspirin 81 MG chewable tablet Chew 81 mg by mouth daily.   Blood Glucose Monitoring Suppl (TRUE METRIX METER) DEVI Use to check blood sugar up to 2 times daily   bosutinib (BOSULIF) 100 MG tablet Take 100 mg by mouth at bedtime. Take with food.   Cholecalciferol 25 MCG (1000 UT) capsule Take 1,000 Units by mouth daily.   empagliflozin (JARDIANCE) 10 MG TABS tablet Take 1  tablet (10 mg total) by mouth daily with breakfast. Please make an appointment for further refills   ferrous sulfate 325 (65 FE) MG tablet Take by mouth.   gabapentin (NEURONTIN) 300 MG capsule TAKE TWO CAPSULES BY MOUTH TWICE DAILY MAX 4 CAPSULE PER DAY - FOR KIDNEY PROTECTION   Insulin Glargine ([name] KWIKPEN) 100 UNIT/ML Inject 20 Units into the skin daily.   Insulin Pen Needle 31G X 5 MM MISC 31 g by Does not apply route as directed.   isosorbide mononitrate (IMDUR) 60 MG 24 hr tablet Take 1 tablet (60 mg total) by mouth daily.    levothyroxine (SYNTHROID) 25 MCG tablet TAKE ONE TABLET BY MOUTH EVERY MORNING TAKE BEFORE BREAKFAST   losartan (COZAAR) 25 MG tablet Take 1 tablet (25 mg total) by mouth daily.   mirabegron ER (MYRBETRIQ) 25 MG TB24 tablet Take 1 tablet (25 mg total) by mouth daily. From Urology   Multiple Vitamin (MULTIVITAMIN WITH MINERALS) TABS tablet Take 1 tablet by mouth daily.   nitroGLYCERIN (NITROSTAT) 0.4 MG SL tablet Place 1 tablet (0.4 mg total) under the tongue every 5 (five) minutes x 3 doses as needed for chest pain.   omeprazole (PRILOSEC) 20 MG capsule Take 20 mg by mouth in the morning.    Polyethyl Glycol-Propyl Glycol (SYSTANE OP) Place 1 drop into both eyes 3 (three) times daily as needed (dry/irritated eyes.).    polyethylene glycol (MIRALAX / GLYCOLAX) 17 g packet Take 17 g by mouth daily as needed for mild constipation.    potassium chloride (KLOR-CON) 10 MEQ tablet Take 1 tablet (10 mEq total) by mouth daily.   ranolazine (RANEXA) 500 MG 12 hr tablet TAKE ONE TABLET BY MOUTH TWICE DAILY   rosuvastatin (CRESTOR) 5 MG tablet TAKE ONE TABLET BY MOUTH ONCE DAILY   simethicone (MYLICON) 80 MG chewable tablet Chew 80 mg by mouth every 6 (six) hours as needed for flatulence.   tamsulosin (FLOMAX) 0.4 MG CAPS capsule Take 1 capsule (0.4 mg total) by mouth daily after supper.   torsemide (DEMADEX) 20 MG tablet Take 3 tablets (60 mg total) by mouth daily.   traMADol (ULTRAM) 50 MG tablet TAKE 1 OR 2 TABLETS BY MOUTH AT BEDTIME AS NEEDED   TRULICITY 1.5 IR/5.1OA SOPN Inject 1.5 mg into the skin once a week.   warfarin (COUMADIN) 3 MG tablet TAKE 1 TO 1 & 1/2 TABLETS BY MOUTH AS DIRECTED BY THE COUMADIN CLINIC   Menthol-Zinc Oxide (GOLD BOND EX) Apply 1 application topically daily as needed (muscle pain). (Patient not taking: Reported on 11/02/2020)   [DISCONTINUED] EASY TOUCH PEN NEEDLES 31G X 8 MM MISC USE 1 NEEDLE DAILY WITH LEVEMIR (Patient not taking: Reported on 11/02/2020)   [DISCONTINUED]  hydrALAZINE (APRESOLINE) 25 MG tablet Take 1 tablet (25 mg total) by mouth 3 (three) times daily. (Patient not taking: No sig reported)   No facility-administered encounter medications on file as of 11/02/2020.     Objective:   PHYSICAL EXAMINATION:    VITALS:   Vitals:   11/02/20 0913 11/02/20 0923 11/02/20 0924  SpO2: 97% 94% 97%  Weight: 194 lb (88 kg)    Height: 5\' 3"  (1.6 m)     Orthostatic VS for the past 72 hrs (Last 3 readings):  Orthostatic BP Patient Position BP Location Orthostatic Pulse  11/02/20 0924 115/58 Standing Right Arm 98  11/02/20 0923 110/72 Sitting Right Arm 93  11/02/20 0913 126/74 Supine Right Arm 85   (Brings in BP from  rehab and it was 127/61 seated and 105/68 standing)  GEN:  The patient appears stated age and is in NAD. HEENT:  Normocephalic, atraumatic.  The mucous membranes are moist. The superficial temporal arteries are without ropiness or tenderness. CV:  RRR Lungs:  CTAB Neck/HEME:  There are no carotid bruits bilaterally.  Neurological examination:  Orientation: The patient is alert and oriented x3. Cranial nerves: There is good facial symmetry.The speech is fluent and clear. Soft palate rises symmetrically and there is no tongue deviation. Hearing is intact to conversational tone. Sensation: Sensation is intact to light touch throughout Motor: Strength is 5/5 in the upper and lower extremities.  No pronator drift. Coordination: No trouble with rapid alternating movements or finger-to-nose.  Movement examination: Tone: There is normal tone in the bilateral upper extremities.  The tone in the lower extremities is normal.  Abnormal movements: There is no rest tremor.  Minimal postural tremor today. Gait and Station: The patient pushes off of the chair to arise.  He ambulates with his cane.  He has an antalgic gait.  [name] wide-based.      Total time spent on today's visit was 35 minutes, including both face-to-face time and  nonface-to-face time.  Time included that spent on review of records (prior notes available to me/labs/imaging if pertinent), discussing treatment and goals, answering patient's questions and coordinating care.  Cc:  [name], DO

## 2020-11-01 NOTE — Chronic Care Management (AMB) (Signed)
Chronic Care Management Pharmacy Note  11/01/2020 Name:  Lucas Caldwell MRN:  [id number] DOB:  14-Jan-1934   Subjective: Lucas Caldwell is an 87 y.o. year old male who is a primary patient of [name], DO.  The CCM team was consulted for assistance with disease management and care coordination needs.    Engaged with patient by telephone for follow up visit in response to provider referral for pharmacy case management and/or care coordination services.   Consent to Services:  The patient was given information about Chronic Care Management services, agreed to services, and gave verbal consent prior to initiation of services.  Please see initial visit note for detailed documentation.   Patient Care Team: [name], DO as PCP - General (Family Medicine) End, [name], MD as PCP - Cardiology (Cardiology) Curley Spice, [name], RPH-CPP as Pharmacist Vanita Ingles, RN as Case Manager (General Practice)  Recent office visits: Office Visit with PCP on 8/1 for diabetes follow up  Recent consult visits: Office Visit with [facility] on 7/20  Hospital visits: Patient seen in [facility] ED on 6/1 for bronchitis   Objective:  Lab Results  Component Value Date   CREATININE 1.73 (H) 08/16/2020   CREATININE 1.55 (H) 06/29/2020   CREATININE 1.72 (H) 04/13/2020    Lab Results  Component Value Date   HGBA1C 6.2 (A) 10/16/2020   Last diabetic Eye exam:  Lab Results  Component Value Date/Time   HMDIABEYEEXA Retinopathy (A) 12/07/2019 12:00 AM    Last diabetic Foot exam: No results found for: HMDIABFOOTEX      Component Value Date/Time   CHOL 123 06/29/2020 1411   TRIG 170 (H) 06/29/2020 1411   HDL 29 (L) 06/29/2020 1411   CHOLHDL 4.2 06/29/2020 1411   CHOLHDL 3.9 08/28/2018 1107   VLDL 31 08/28/2018 1107   LDLCALC 65 06/29/2020 1411    Hepatic Function Latest Ref Rng & Units 08/16/2020 08/28/2018  04/22/2018  Total Protein 6.5 - 8.1 g/dL 7.7 8.0 7.2  Albumin 3.5 - 5.0 g/dL 3.8 3.7 4.0  AST 15 - 41 U/L '21 23 21  ' ALT 0 - 44 U/L '13 14 10  ' Alk Phosphatase 38 - 126 U/L 53 82 105  Total Bilirubin 0.3 - 1.2 mg/dL 0.8 0.5 <0.2  Bilirubin, Direct 0.0 - 0.2 mg/dL <0.1 0.1 -    Social History   Tobacco Use  Smoking Status Former   Packs/day: 1.75   Years: 20.00   Pack years: 35.00   Types: Cigarettes   Start date: 88   Quit date: 1975   Years since quitting: 47.6  Smokeless Tobacco Former   BP Readings from Last 3 Encounters:  10/16/20 (!) 120/55  10/04/20 110/70  09/15/20 (!) 146/82   Pulse Readings from Last 3 Encounters:  10/16/20 80  10/04/20 82  09/15/20 77   Wt Readings from Last 3 Encounters:  10/16/20 195 lb 3.2 oz (88.5 kg)  10/04/20 194 lb (88 kg)  09/15/20 194 lb (88 kg)    Assessment: Review of patient past medical history, allergies, medications, health status, including review of consultants reports, laboratory and other test data, was performed as part of comprehensive evaluation and provision of chronic care management services.   SDOH:  (Social Determinants of Health) assessments and interventions performed:    CCM Care Plan  Allergies  Allergen Reactions   Clopidogrel Anaphylaxis and Other (See Comments)    altered mental status;  (electrolytes "out  of wack" and confusion per family; THEY DO NOT REMEMBER ANY OTHER REACTION)- MSB 10/10/15   Ciprofloxacin Itching   Mirtazapine Diarrhea    Bad dreams    Spironolactone Other (See Comments)    gynecomastia    Medications Reviewed Today     Reviewed by Rennis Petty, RPH-CPP (Pharmacist) on 11/01/20 at 1548  Med List Status: <None>   Medication Order Taking? Sig Documenting Provider Last Dose Status Informant  acetaminophen (TYLENOL) 500 MG tablet [id number]  Take 1,000 mg by mouth every 6 (six) hours as needed for moderate pain or headache.  [provider]  Active  Spouse/Significant Other  Alcohol Swabs (B-D SINGLE USE SWABS REGULAR) PADS [id number]  Use to check blood sugar up to 2 times daily [name], DO  Active   allopurinol (ZYLOPRIM) 100 MG tablet [id number]  Take 1 tablet (100 mg total) by mouth daily. Bensimhon, Shaune Pascal, MD  Active   aspirin 81 MG chewable tablet [id number]  Chew 81 mg by mouth daily. [provider]  Active Spouse/Significant Other  Blood Glucose Monitoring Suppl (TRUE METRIX METER) DEVI [id number]  Use to check blood sugar up to 2 times daily [name], DO  Active   bosutinib (BOSULIF) 100 MG tablet [id number]  Take 100 mg by mouth at bedtime. Take with food. [provider]  Active            Med Note Kenton Kingfisher, Germaine Pomfret Sep 15, 2019  8:38 AM)    Cholecalciferol 25 MCG (1000 UT) capsule [id number]  Take 1,000 Units by mouth daily. [provider]  Active   EASY TOUCH PEN NEEDLES 31G X 8 MM MISC [id number]  USE 1 NEEDLE DAILY WITH LEVEMIR Karamalegos, [name], DO  Active   empagliflozin (JARDIANCE) 10 MG TABS tablet [id number]  Take 1 tablet (10 mg total) by mouth daily with breakfast. Please make an appointment for further refills Bensimhon, Shaune Pascal, MD  Active   ferrous sulfate 325 (65 FE) MG tablet [id number]  Take by mouth. [provider]  Active   gabapentin (NEURONTIN) 300 MG capsule [id number]  TAKE TWO CAPSULES BY MOUTH TWICE DAILY MAX 4 CAPSULE PER DAY - FOR KIDNEY PROTECTION [name], DO  Active   hydrALAZINE (APRESOLINE) 25 MG tablet [id number] No Take 1 tablet (25 mg total) by mouth 3 (three) times daily.  Patient not taking: Reported on 11/01/2020   Theora Gianotti, NP Not Taking Active   Insulin Glargine [facility]) 100 UNIT/ML [id number]  Inject 20 Units into the skin daily. [provider]  Active   Insulin Pen Needle 31G X 5 MM MISC [id number]  31 g by Does not apply route as directed. [name], NP   Active Spouse/Significant Other  isosorbide mononitrate (IMDUR) 60 MG 24 hr tablet [id number]  Take 1 tablet (60 mg total) by mouth daily. End, [name], MD  Active   levothyroxine (SYNTHROID) 25 MCG tablet [id number]  TAKE ONE TABLET BY MOUTH EVERY MORNING TAKE BEFORE BREAKFAST Karamalegos, Alexander Lenna Sciara, DO  Active   losartan (COZAAR) 25 MG tablet [id number]  Take 1 tablet (25 mg total) by mouth daily. Rise Mu, PA-C  Active   Menthol-Zinc Oxide (GOLD [place]) [id number]  Apply 1 application topically daily as needed (muscle pain). [provider]  Active Spouse/Significant Other  mirabegron ER (MYRBETRIQ) 25 MG TB24 tablet [id number]  Take 1 tablet (25 mg total) by mouth daily. From Urology Vaillancourt,  Aldona Bar, PA-C  Active   Multiple Vitamin (MULTIVITAMIN WITH MINERALS) TABS tablet [id number]  Take 1 tablet by mouth daily. [provider]  Active Spouse/Significant Other  nitroGLYCERIN (NITROSTAT) 0.4 MG SL tablet [id number]  Place 1 tablet (0.4 mg total) under the tongue every 5 (five) minutes x 3 doses as needed for chest pain. Theora Gianotti, NP  Active   omeprazole (PRILOSEC) 20 MG capsule [id number]  Take 20 mg by mouth in the morning.  [provider]  Active Spouse/Significant Other  Polyethyl Glycol-Propyl Glycol (SYSTANE OP) [id number]  Place 1 drop into both eyes 3 (three) times daily as needed (dry/irritated eyes.).  [provider]  Active Spouse/Significant Other  polyethylene glycol (MIRALAX / GLYCOLAX) 17 g packet [id number]  Take 17 g by mouth daily as needed for mild constipation.  [provider]  Active Spouse/Significant Other  potassium chloride (KLOR-CON) 10 MEQ tablet [id number]  Take 1 tablet (10 mEq total) by mouth daily. Karamalegos, [name], DO  Active   ranolazine (RANEXA) 500 MG 12 hr tablet [id number]  TAKE ONE TABLET BY MOUTH TWICE DAILY End, [name], MD  Active   rosuvastatin (CRESTOR) 5 MG tablet [id number]   TAKE ONE TABLET BY MOUTH ONCE DAILY End, [name], MD  Active   simethicone (MYLICON) 80 MG chewable tablet [id number]  Chew 80 mg by mouth every 6 (six) hours as needed for flatulence. [provider]  Active   tamsulosin (FLOMAX) 0.4 MG CAPS capsule [id number]  Take 1 capsule (0.4 mg total) by mouth daily after supper. Rise Mu, PA-C  Active   torsemide (DEMADEX) 20 MG tablet [id number]  Take 3 tablets (60 mg total) by mouth daily. Bensimhon, Shaune Pascal, MD  Active   traMADol (ULTRAM) 50 MG tablet [id number]  TAKE 1 OR 2 TABLETS BY MOUTH AT BEDTIME AS NEEDED [name], DO  Active   TRULICITY 1.5 VE/7.2CN SOPN [id number]  Inject 1.5 mg into the skin once a week. [name], DO  Active            Med Note (NEWCOMER [place], BRANDY L   Thu Jun 29, 2020  1:45 PM)    warfarin (COUMADIN) 3 MG tablet [id number]  TAKE 1 TO 1 & 1/2 TABLETS BY MOUTH AS DIRECTED BY THE COUMADIN CLINIC End, [name], MD  Active             Patient Active Problem List   Diagnosis Date Noted   Orthostatic hypotension 10/16/2020   Obstructive sleep apnea 06/29/2020   Urge incontinence of urine 12/22/2019   Trifascicular block 10/20/2019   Wound infection 10/08/2019   Bifascicular block 05/13/2019   Coronary stent patent 04/13/2019   Chronic HFrEF (heart failure with reduced ejection fraction) ([place]) 03/19/2019   PSVT (paroxysmal supraventricular tachycardia) ([place]) 03/19/2019   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 ([place]) 08/24/2018   CKD (chronic kidney disease), stage IV ([place]) 06/04/2018   Osteoarthritis of knees, bilateral 06/02/2018   Hyperlipidemia associated with type 2 diabetes mellitus ([place]) 05/06/2018   Pain of lower extremity 03/06/2018   Duodenitis 01/30/2018   Chronic heart failure with preserved ejection fraction (HFpEF) ([place]) 01/23/2018   Hematochezia    Melena    Rectal bleeding 01/11/2018   AVM (arteriovenous malformation) of colon without  hemorrhage    Schatzki's ring    Esophageal dysphagia    Diabetic retinopathy ([place]) 07/08/2017   Ischemic cardiomyopathy 06/26/2017   Cardiomyopathy ([place]) 06/26/2017   Centrilobular emphysema (  [place]) 06/13/2017   Fatigue 12/18/2016   Stable angina (HCC) 09/20/2016   Stage 3a chronic kidney disease ([place]) 09/20/2016   Paroxysmal atrial fibrillation ([place]) 08/28/2016   Chronic anticoagulation 08/28/2016   Essential hypertension 08/20/2016   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 heart with stable angina pectoris ([place]) 08/19/2016   Accelerating angina ([place]) 08/15/2016   Exertional dyspnea 08/12/2016   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   Gynecomastia, male 03/31/2014   Hypertrophy of breast 03/31/2014   Nuclear sclerosis of left eye 10/22/2013   Enthesopathy of ankle and tarsus 04/10/2013   Controlled type 2 diabetes mellitus with diabetic nephropathy ([place]) 04/09/2013   Diabetic neuropathy associated with type 2 diabetes mellitus ([place]) 04/09/2013   Cataract of left eye 11/25/2011   Corneal opacity 09/30/2011   Pseudophakia of right eye 09/30/2011   GERD (gastroesophageal reflux disease) 09/25/2011   Gout 09/25/2011   Hyperlipidemia LDL goal <70 09/25/2011   Status post cataract extraction 04/11/2011   Cataract extraction status of eye 04/11/2011   Iron deficiency anemia 04/01/2011   Benign localized hyperplasia of prostate without urinary obstruction and other lower urinary tract symptoms (LUTS) 12/10/2010   Enlarged prostate without lower urinary tract symptoms (luts) 12/10/2010   Colon cancer screening 10/11/2010   Hypothyroidism 10/11/2010   Hyperlipidemia 09/20/2003   Essential and other specified forms of tremor 01/05/2001   Essential tremor 01/05/2001    Immunization History  Administered Date(s) Administered   Influenza, High Dose Seasonal PF 12/29/2014, 12/19/2016, 12/22/2017   Influenza, Seasonal, Injecte, Preservative Fre 01/13/2007, 12/13/2008, 01/30/2010,  12/26/2011   Influenza,inj,Quad PF,6+ Mos 12/25/2012, 12/09/2015, 11/26/2018, 12/15/2019   PFIZER(Purple Top)SARS-COV-2 Vaccination 05/13/2019, 06/08/2019, 12/15/2019, 05/19/2020   PPD Test 09/25/2010   Pneumococcal Conjugate-13 01/31/2014   Pneumococcal Polysaccharide-23 06/20/2008, 09/02/2010    Conditions to be addressed/monitored: T2DM, CKD, CAD, HF, HLD, atrial fibrillation, OSA, HTN, hypothyroidism  Care Plan : PharmD - Medication Management/Assistance  Updates made by Rennis Petty, RPH-CPP since 11/01/2020 12:00 AM     Problem: Disease Progression      Long-Range Goal: Disease Progression Prevented or Minimized   Start Date: 04/14/2020  Expected End Date: 07/13/2020  This Visit's Progress: On track  Recent Progress: On track  Priority: High  Note:   Current Barriers:  Chronic Disease Management support, education, and care coordination needs related to type 2 diabetes, CKD, HFrEF s/p CardioMEMS, hyperlipidemia, atrial fibrillation, hypertension, coronary artery disease, CHF, hypothyroidism, and CML Financial Note patient has Partial Extra Help/LIS Subsidy Patient enrolled in [place] [employer] health practitioner and [place]) patient assistance program for 2022 calendar year Patient denied for patient assistance for [place] from [employer] for 2022 calendar year Patient enrolled in [place] for [place] copayment assistance (eligibility through 08/28/2021) Limited vision  Pharmacist Clinical Goal(s):  Over the next 90 days, patient will verbalize ability to afford treatment regimen through collaboration with PharmD and provider.   Interventions: 1:1 collaboration with [name], DO regarding development and update of comprehensive plan of care as evidenced by provider attestation and co-signature Inter-disciplinary care team collaboration (see longitudinal plan of care) Perform chart review Office Visit with [facility]  on 7/20. Provider advised: Discontinue carvedilol in case it is contributing to his symptoms, particularly nightmares.  If he does not notice any significant improvement in the next month, carvedilol or metoprolol succinate should be restarted Encouraged patient to increase his activity Repeat an echocardiogram (completed on 8/11) Office Visit with PCP on 8/1 for diabetes follow up  A1C 6.2% Continue [name] 20 units daily, in future can continue to gradually reduce dose by 1unit per week if fasting CBG < 150 consistently STOP Hydralazine 11m 3 times a day Monitor your blood pressure for now, goal is to make sure your BP remains controlled off this med. May need to add back one or two doses a day in future ONLY IF NEEDED if elevated BP >160/110 consistently Note patient's wife called office on 8/15 requesting patient have an appointment, and then requests head CT, related to continued dizziness/leg pain.  Provider [facility] of [facility] (new referral placed by PCP as requested by Neurology) Today spouse confirms patient stopped carvedilol as directed by Cardiologist and stopped hydralazine as directed by PCP Denies monitoring patient's home BP recently Encourage to restart monitoring home BP, keep log of results and bring record with patient to upcoming medical appointments  Coordination of Care: Patient's spouse reports she has been having trouble getting in touch [facility] to get patient scheduled. Asks that I call with her today Place call to [place] with patient's wife on the line Wife schedules appointment with Dr. TCarles Colletfor tomorrow, 8/18, at 9:15 am Scheduler notes appointment tomorrow available due to cancelation; provider's next availability is not until November Patient/spouse report that they will call family members to figure who can go with them to [place] for [place] Confirms will call [facility] to cancel appointment with rehab for tomorrow  Type 2 Diabetes: Controlled; current treatment: [name] 20 units daily Trulicity 1.5 mg once weekly Jardiance 10 mg once daily in morning Patient reports recent AM CBGs readings ranging 130s-140s Confirms will follow PCP's direction to gradually reduce dose by 1 unit per week if fasting CBG < 150 consistently.  Accordingly, plans to reduce patient's [name] dose to 19 units daily starting tomorrow Denies episodes of hypoglycemia Have encouraged patient to have regular well-balanced meals   Patient Goals/Self-Care Activities Over the next 90 days, patient will:  Check blood sugar, blood pressure and fluid status as directed by providers  Take medications, with assistance of wife, as directed Mrs. Galloway manages patient's medications using weekly pillbox as adherence tool Attends scheduled medical appointments  Follow Up Plan: Telephone follow up appointment with care management team member scheduled for: 11/15/2020 at 10:00 AM      Patient's preferred pharmacy is:  [place] [address]Phone: [phone]: [phone] [address] [address]HN[place]Phone: [phone]: [phone] HWest [facility] ViewMail Delivery (Now [facility] Delivery) - [place] [address]Phone: [phone]: [phone] [facility] [place]- [address] SWhelen Springs[address]Phone: [phone]: [phone]  Follow Up:  Patient agrees to Care Plan and Follow-up.  EWallace Cullens PharmD, BPara March CPP Clinical Pharmacist [phone]

## 2020-11-02 ENCOUNTER — Other Ambulatory Visit: Payer: Self-pay

## 2020-11-02 ENCOUNTER — Ambulatory Visit: Payer: Medicare HMO

## 2020-11-02 ENCOUNTER — Encounter: Payer: Self-pay | Admitting: Neurology

## 2020-11-02 ENCOUNTER — Ambulatory Visit (INDEPENDENT_AMBULATORY_CARE_PROVIDER_SITE_OTHER): Payer: Medicare HMO | Admitting: Neurology

## 2020-11-02 VITALS — Ht 63.0 in | Wt 194.0 lb

## 2020-11-02 DIAGNOSIS — I951 Orthostatic hypotension: Secondary | ICD-10-CM

## 2020-11-07 ENCOUNTER — Other Ambulatory Visit: Payer: Self-pay

## 2020-11-07 ENCOUNTER — Ambulatory Visit: Payer: Medicare HMO

## 2020-11-08 ENCOUNTER — Encounter: Payer: Self-pay | Admitting: Nurse Practitioner

## 2020-11-08 ENCOUNTER — Ambulatory Visit (INDEPENDENT_AMBULATORY_CARE_PROVIDER_SITE_OTHER): Payer: Medicare HMO

## 2020-11-08 ENCOUNTER — Ambulatory Visit (INDEPENDENT_AMBULATORY_CARE_PROVIDER_SITE_OTHER): Payer: Medicare HMO | Admitting: Nurse Practitioner

## 2020-11-08 VITALS — BP 108/66 | HR 84 | Ht 64.0 in | Wt 196.0 lb

## 2020-11-08 DIAGNOSIS — I2510 Atherosclerotic heart disease of native coronary artery without angina pectoris: Secondary | ICD-10-CM | POA: Diagnosis not present

## 2020-11-08 DIAGNOSIS — I951 Orthostatic hypotension: Secondary | ICD-10-CM | POA: Diagnosis not present

## 2020-11-08 DIAGNOSIS — E785 Hyperlipidemia, unspecified: Secondary | ICD-10-CM | POA: Diagnosis not present

## 2020-11-08 DIAGNOSIS — I480 Paroxysmal atrial fibrillation: Secondary | ICD-10-CM

## 2020-11-08 DIAGNOSIS — I10 Essential (primary) hypertension: Secondary | ICD-10-CM

## 2020-11-08 DIAGNOSIS — I255 Ischemic cardiomyopathy: Secondary | ICD-10-CM

## 2020-11-08 DIAGNOSIS — Z7901 Long term (current) use of anticoagulants: Secondary | ICD-10-CM

## 2020-11-08 DIAGNOSIS — N183 Chronic kidney disease, stage 3 unspecified: Secondary | ICD-10-CM | POA: Diagnosis not present

## 2020-11-08 DIAGNOSIS — I5022 Chronic systolic (congestive) heart failure: Secondary | ICD-10-CM | POA: Diagnosis not present

## 2020-11-08 DIAGNOSIS — Z5181 Encounter for therapeutic drug level monitoring: Secondary | ICD-10-CM

## 2020-11-08 LAB — POCT INR: INR: 2.1 (ref 2.0–3.0)

## 2020-11-08 MED ORDER — LOSARTAN POTASSIUM 25 MG PO TABS: 12.5000 mg | ORAL_TABLET | Freq: Every day | ORAL | 3 refills | Status: DC

## 2020-11-08 NOTE — Patient Instructions (Signed)
Medication Instructions:  - Your physician has recommended you make the following change in your medication:   1) DECREASE losartan 25 mg- take 0.5 tablet (12.5 mg) by mouth once daily  *If you need a refill on your cardiac medications before your next appointment, please call your pharmacy*   Lab Work: - none ordered  If you have labs (blood work) drawn today and your tests are completely normal, you will receive your results only by: [place] (if you have MyChart) OR A paper copy in the mail If you have any lab test that is abnormal or we need to change your treatment, we will call you to review the results.   Testing/Procedures: - none ordered   Follow-Up: At [facility], you and your health needs are our priority.  As part of our continuing mission to provide you with exceptional heart care, we have created designated Provider Care Teams.  These Care Teams include your primary Cardiologist (physician) and Advanced Practice Providers (APPs -  Physician Assistants and Nurse Practitioners) who all work together to provide you with the care you need, when you need it.  We recommend signing up for the patient portal called "MyChart".  Sign up information is provided on this After Visit Summary.  MyChart is used to connect with patients for Virtual Visits (Telemedicine).  Patients are able to view lab/test results, encounter notes, upcoming appointments, etc.  Non-urgent messages can be sent to your provider as well.   To learn more about what you can do with MyChart, go to [email or web address].    Your next appointment:   3 month(s)  The format for your next appointment:   In Person  Provider:   You may see [name], MD or one of the following Advanced Practice Providers on your designated Care Team:   [name], NP    Other Instructions N/a

## 2020-11-08 NOTE — Patient Instructions (Signed)
-   continue dosage of warfarin of 1.5 TABLETS EVERY DAY EXCEPT 1 TABLETS ON MONDAYS, [place]. - recheck INR in 6 weeks.

## 2020-11-08 NOTE — Progress Notes (Signed)
Office Visit    Patient Name: Lucas Caldwell Date of Encounter: 11/08/2020  Primary Care Provider:  [name], DO Primary Cardiologist:  [name], MD  Chief Complaint    87 y/o ? with a history of CAD status post prior RCA, circumflex, and LAD stents, ischemic cardiomyopathy, chronic combined systolic and diastolic congestive heart failure, hypertension, hyperlipidemia, diabetes, paroxysmal atrial fibrillation (on warfarin), stage III-IV chronic kidney disease, CML, iron deficiency anemia, GI bleed due to duodenal ulcers (October 2019), BPH, hypothyroidism, and peripheral neuropathy with unsteady gait, who presents for follow-up of CAD and heart failure.  Past Medical History    Past Medical History:  Diagnosis Date   Arthritis    "probably in his legs" (08/15/2016)   Blind right eye    BPH (benign prostatic hyperplasia)    Breast asymmetry    Left breast is larger, present for several years.   CAD (coronary artery disease)    a. Cath in the late 90's - reportedly ok;  b. 2014 s/p stenting x 2 @ [facility]; c 08/19/16 Cath/PCI with DES -> RCA, plan to treat LM 70% medically. Seen by surgery and felt to be too high risk for CABG; d. 08/2018 Cath: LM 70 (iFR 0.86), LAD 20ost, 40p, 50/106m, D1 70, LCX patent stent, OM1 30, RCA patent stent, 54m ISR, 40d, RPDA 30. PCWP 8. CO/CI 4.0/2.1; e. 03/2019 PCI to LAD (2.5x15 Resolute Onyx DES).   Chronic combined systolic (congestive) and diastolic (congestive) heart failure ([place])    a. Previously reduced EF-->50% by echo in 2012;  b. 06/2015 Echo: EF 50-55%  c. 07/2016 Echo: EF 45-50%; d. 12/2017 Echo: EF 55%; e. 08/2018 Echo: EF 35-40%; f. 04/2019 Echo: EF 30-35%; g. s/p cardiomems; h. 10/2020 Echo: EF 30-35%, mod-sev glob HK, gr1 DD.   Chronic kidney disease (CKD), stage IV (severe) (HCC)    CML (chronic myelocytic leukemia) (HCC)    GERD (gastroesophageal reflux disease)    GIB (gastrointestinal bleeding)    a. 12/2017 3 unit PRBC  GIB in setting of coumadin-->Endo/colon multiple duodenal ulcers and a single bleeding ulcer in the proximal ascending colon status post hemostatic clipping x2.   Gout    Hyperlipemia    Hypertension    Hypothyroidism    Iron deficiency anemia    Ischemic cardiomyopathy    a. Previously reduced EF-->50% by echo in 2012;  b. 06/2015 Echo: EF 50-55%, Gr2 DD;  c. 07/2016 Echo: EF 45-50%; d. 12/2017 Echo: EF 55%, Gr1 DD, mild MR; e. 08/2018 Echo: EF 35-40%; f. 04/2019 Echo: EF 30-35%; g. 10/2020 Echo: EF 30-35%.   Migraine    "in the 1960s" (08/15/2016)   Obstructive sleep apnea    Orthostatic hypotension    a. 20222 -->carvedilol and hydralazine d/c'd.   PAF (paroxysmal atrial fibrillation) (HCC)    a. ? Dx 2014-->s/p DCCV;  b. CHA2DS2VASc = 6--> Coumadin.   Prostate cancer ([place])    RBBB    Type II diabetes mellitus ([place])    Past Surgical History:  Procedure Laterality Date   CATARACT EXTRACTION W/ INTRAOCULAR LENS  IMPLANT, BILATERAL Bilateral    COLONOSCOPY N/A 01/13/2018   Procedure: COLONOSCOPY;  Surgeon: Lin Landsman, MD;  Location: [facility] ENDOSCOPY;  Service: Gastroenterology;  Laterality: N/A;   COLONOSCOPY WITH PROPOFOL N/A 01/01/2018   Procedure: COLONOSCOPY WITH PROPOFOL;  Surgeon: Lin Landsman, MD;  Location: [facility] ENDOSCOPY;  Service: Gastroenterology;  Laterality: N/A;   CORONARY ANGIOPLASTY WITH STENT PLACEMENT  09/2012   2 stents   CORONARY STENT INTERVENTION N/A 08/19/2016   Procedure: Coronary Stent Intervention;  Surgeon: [name], MD;  Location: [place] [facility];  Service: Cardiovascular;  Laterality: N/A;   CORONARY STENT INTERVENTION N/A 04/13/2019   Procedure: CORONARY STENT INTERVENTION;  Surgeon: [name], MD;  Location: [place] [facility];  Service: Cardiovascular;  Laterality: N/A;   ESOPHAGEAL MANOMETRY N/A 12/16/2017   Procedure: ESOPHAGEAL MANOMETRY (EM);  Surgeon: Lin Landsman, MD;  Location: [facility] ENDOSCOPY;  Service:  Gastroenterology;  Laterality: N/A;   ESOPHAGOGASTRODUODENOSCOPY N/A 12/20/2016   Procedure: ESOPHAGOGASTRODUODENOSCOPY (EGD);  Surgeon: Lin Landsman, MD;  Location: [facility] ENDOSCOPY;  Service: Gastroenterology;  Laterality: N/A;   ESOPHAGOGASTRODUODENOSCOPY N/A 01/13/2018   Procedure: ESOPHAGOGASTRODUODENOSCOPY (EGD);  Surgeon: Lin Landsman, MD;  Location: [facility] ENDOSCOPY;  Service: Gastroenterology;  Laterality: N/A;   ESOPHAGOGASTRODUODENOSCOPY (EGD) WITH PROPOFOL N/A 11/03/2017   Procedure: ESOPHAGOGASTRODUODENOSCOPY (EGD) WITH PROPOFOL;  Surgeon: Lin Landsman, MD;  Location: [facility] ENDOSCOPY;  Service: Gastroenterology;  Laterality: N/A;   EYE SURGERY     HERNIA REPAIR     "[place]"   LEFT HEART CATH AND CORONARY ANGIOGRAPHY N/A 08/14/2016   Procedure: Left Heart Cath and Coronary Angiography;  Surgeon: [name], MD;  Location: [place] [facility];  Service: Cardiovascular;  Laterality: N/A;   PRESSURE SENSOR/CARDIOMEMS N/A 09/23/2019   Procedure: PRESSURE SENSOR/CARDIOMEMS;  Surgeon: Jolaine Artist, MD;  Location: [place] [facility];  Service: Cardiovascular;  Laterality: N/A;   RIGHT HEART CATH N/A 09/23/2019   Procedure: RIGHT HEART CATH;  Surgeon: Jolaine Artist, MD;  Location: [place] [facility];  Service: Cardiovascular;  Laterality: N/A;   RIGHT/LEFT HEART CATH AND CORONARY ANGIOGRAPHY N/A 09/01/2018   Procedure: RIGHT/LEFT HEART CATH AND CORONARY ANGIOGRAPHY;  Surgeon: [name], MD;  Location: [place] [facility];  Service: Cardiovascular;  Laterality: N/A;   RIGHT/LEFT HEART CATH AND CORONARY ANGIOGRAPHY N/A 04/13/2019   Procedure: RIGHT/LEFT HEART CATH AND CORONARY ANGIOGRAPHY;  Surgeon: [name], MD;  Location: [place] [facility];  Service: Cardiovascular;  Laterality: N/A;   TOE AMPUTATION Right    "big toe"    Allergies  Allergies  Allergen Reactions   Clopidogrel Anaphylaxis and Other (See Comments)    altered mental status;   (electrolytes "out of wack" and confusion per family; THEY DO NOT REMEMBER ANY OTHER REACTION)- MSB 10/10/15   Ciprofloxacin Itching   Mirtazapine Diarrhea    Bad dreams    Spironolactone Other (See Comments)    gynecomastia    History of Present Illness    87 y/o ? with a history of CAD status post prior RCA, circumflex, and LAD stents, ischemic cardiomyopathy, chronic combined systolic and diastolic congestive heart failure, hypertension, hyperlipidemia, diabetes, paroxysmal atrial fibrillation (on warfarin), stage III-IV chronic kidney disease, CML, iron deficiency anemia, GI bleed due to duodenal ulcers (October 2019), BPH, hypothyroidism, and peripheral neuropathy with unsteady gait.  He previously underwent stenting of the right coronary artery in June 2018 was noted to have residual 70% left main disease.  He was evaluated by CT surgery at that time and felt not to be a suitable surgical candidate.  Due to ongoing symptoms, he underwent PCI of the LAD and January 2021.  EF has been variable over the years with a high of 55% by echocardiogram in 2019 with subsequent decline in EF to 30-35% by echo in February 2021.  In July 2021, he underwent CardioMEMS placement and this has  been followed by our advanced heart failure team in [place] since.  Earlier this year, he reported orthostatic lightheadedness.  He was also having nightmares.  Metoprolol was changed to carvedilol however, this seemed to worsen his symptoms and was subsequently discontinued in July.  Echocardiogram earlier this month showed an EF of 30 to 35%.  He continued to have orthostatic symptoms at PCP follow-up on August 1 and hydralazine was discontinued.  Mr. Cush presents with his wife today.  Overall, things have been stable, though he is particularly bothered by lower extremity peripheral neuropathy with associated unstable gait.  He has been using a cane to get around and does have a walker and sometimes uses this as well.   He has been seen by neurology.  He also continues to experience orthostatic lightheadedness.  He has been receiving physical therapy at home and his wife shows that his blood pressures will drop between 10 and 20 mmHg simply by moving from a seated to a standing position.  Fortunately, Mr. Bifulco has not had any chest pain.  He has chronic, stable dyspnea exertion.  His weight has been stable and he denies palpitations, PND, orthopnea, syncope, edema, or early satiety.  He reports he continues to have vivid dreams though not necessarily nightmares.  Home Medications    Current Outpatient Medications  Medication Sig Dispense Refill   acetaminophen (TYLENOL) 500 MG tablet Take 1,000 mg by mouth every 6 (six) hours as needed for moderate pain or headache.      Alcohol Swabs (B-D SINGLE USE SWABS REGULAR) PADS Use to check blood sugar up to 2 times daily 200 each 3   allopurinol (ZYLOPRIM) 100 MG tablet Take 1 tablet (100 mg total) by mouth daily. 90 tablet 0   aspirin 81 MG chewable tablet Chew 81 mg by mouth daily.     Blood Glucose Monitoring Suppl (TRUE METRIX METER) DEVI Use to check blood sugar up to 2 times daily 1 each 0   bosutinib (BOSULIF) 100 MG tablet Take 100 mg by mouth at bedtime. Take with food.     Cholecalciferol 25 MCG (1000 UT) capsule Take 1,000 Units by mouth daily.     empagliflozin (JARDIANCE) 10 MG TABS tablet Take 1 tablet (10 mg total) by mouth daily with breakfast. Please make an appointment for further refills 30 tablet 0   ferrous sulfate 325 (65 FE) MG tablet Take by mouth.     gabapentin (NEURONTIN) 300 MG capsule TAKE TWO CAPSULES BY MOUTH TWICE DAILY MAX 4 CAPSULE PER DAY - FOR KIDNEY PROTECTION 120 capsule 5   Insulin Glargine ([name] KWIKPEN) 100 UNIT/ML Inject 20 Units into the skin daily.     Insulin Pen Needle 31G X 5 MM MISC 31 g by Does not apply route as directed. 100 each 5   isosorbide mononitrate (IMDUR) 60 MG 24 hr tablet Take 1 tablet (60 mg total) by  mouth daily. 135 tablet 2   levothyroxine (SYNTHROID) 25 MCG tablet TAKE ONE TABLET BY MOUTH EVERY MORNING TAKE BEFORE BREAKFAST 90 tablet 0   Menthol-Zinc Oxide (GOLD BOND EX) Apply 1 application topically daily as needed (muscle pain).     mirabegron ER (MYRBETRIQ) 25 MG TB24 tablet Take 1 tablet (25 mg total) by mouth daily. From Urology 30 tablet 11   Multiple Vitamin (MULTIVITAMIN WITH MINERALS) TABS tablet Take 1 tablet by mouth daily.     nitroGLYCERIN (NITROSTAT) 0.4 MG SL tablet Place 1 tablet (0.4 mg total) under the tongue  every 5 (five) minutes x 3 doses as needed for chest pain. 25 tablet 4   omeprazole (PRILOSEC) 20 MG capsule Take 20 mg by mouth in the morning.      Polyethyl Glycol-Propyl Glycol (SYSTANE OP) Place 1 drop into both eyes 3 (three) times daily as needed (dry/irritated eyes.).      polyethylene glycol (MIRALAX / GLYCOLAX) 17 g packet Take 17 g by mouth daily as needed for mild constipation.      potassium chloride (KLOR-CON) 10 MEQ tablet Take 1 tablet (10 mEq total) by mouth daily. 90 tablet 1   ranolazine (RANEXA) 500 MG 12 hr tablet TAKE ONE TABLET BY MOUTH TWICE DAILY 180 tablet 2   rosuvastatin (CRESTOR) 5 MG tablet TAKE ONE TABLET BY MOUTH ONCE DAILY 90 tablet 0   simethicone (MYLICON) 80 MG chewable tablet Chew 80 mg by mouth every 6 (six) hours as needed for flatulence.     tamsulosin (FLOMAX) 0.4 MG CAPS capsule Take 1 capsule (0.4 mg total) by mouth daily after supper. 30 capsule 11   torsemide (DEMADEX) 20 MG tablet Take 3 tablets (60 mg total) by mouth daily. 90 tablet 6   traMADol (ULTRAM) 50 MG tablet TAKE 1 OR 2 TABLETS BY MOUTH AT BEDTIME AS NEEDED 30 tablet 2   TRULICITY 1.5 MV/7.8IO SOPN Inject 1.5 mg into the skin once a week. 6 mL 3   warfarin (COUMADIN) 3 MG tablet TAKE 1 TO 1 & 1/2 TABLETS BY MOUTH AS DIRECTED BY THE COUMADIN CLINIC 120 tablet 0   losartan (COZAAR) 25 MG tablet Take 0.5 tablets (12.5 mg total) by mouth daily. 90 tablet 3   No  current facility-administered medications for this visit.     Review of Systems    As above, he has chronic, stable dyspnea on exertion.  [name] bothered by lower extremity peripheral neuropathy and unsteady gait.  He has vivid dreams but not necessarily nightmares.  He continues to experience orthostatic lightheadedness.  He denies chest pain, palpitations, PND, orthopnea, syncope, edema, or early satiety.  All other systems reviewed and are otherwise negative except as noted above.  Physical Exam    VS:  BP 108/66 (BP Location: Left Arm, Patient Position: Sitting, Cuff Size: Normal)   Pulse 84   Ht 5\' 4"  (1.626 m)   Wt 196 lb (88.9 kg)   SpO2 97%   BMI 33.64 kg/m  , BMI Body mass index is 33.64 kg/m.    Orthostatic VS for the past 24 hrs:  BP- Lying Pulse- Lying BP- Sitting Pulse- Sitting BP- Standing at 0 minutes Pulse- Standing at 0 minutes  11/08/20 1614 119/74 83 119/69 89 112/68 101    GEN: Well nourished, well developed, in no acute distress. HEENT: normal. Neck: Supple, no JVD, carotid bruits, or masses. Cardiac: RRR, no murmurs, rubs, or gallops. No clubbing, cyanosis, edema.  Radials/PT 2+ and equal bilaterally.  Respiratory:  Respirations regular and unlabored, clear to auscultation bilaterally. GI: Soft, nontender, nondistended, BS + x 4. MS: no deformity or atrophy. Skin: warm and dry, no rash. Neuro:  Strength and sensation are intact. Psych: Normal affect.  Accessory Clinical Findings    Lab Results  Component Value Date   WBC 13.6 (H) 08/16/2020   HGB 12.3 (L) 08/16/2020   HCT 37.3 (L) 08/16/2020   MCV 87.6 08/16/2020   PLT 168 08/16/2020   Lab Results  Component Value Date   CREATININE 1.73 (H) 08/16/2020   BUN 23 08/16/2020  NA 135 08/16/2020   K 4.7 08/16/2020   CL 100 08/16/2020   CO2 24 08/16/2020   Lab Results  Component Value Date   ALT 13 08/16/2020   AST 21 08/16/2020   ALKPHOS 53 08/16/2020   BILITOT 0.8 08/16/2020   Lab Results   Component Value Date   CHOL 123 06/29/2020   HDL 29 (L) 06/29/2020   LDLCALC 65 06/29/2020   TRIG 170 (H) 06/29/2020   CHOLHDL 4.2 06/29/2020    Lab Results  Component Value Date   HGBA1C 6.2 (A) 10/16/2020    Assessment & Plan    1.  Chronic heart failure with reduced ejection fraction/ischemic cardiomyopathy: Recent echocardiogram shows stable LV dysfunction with an EF of 30 to 35%, moderate to severe global hypokinesis, and grade 1 diastolic dysfunction.  Symptomatically, patient has been stable with chronic dyspnea on exertion that is unchanged.  His weight is stable and he is euvolemic on examination today.  Unfortunately, in the setting of orthostatic hypotension, his heart failure medications have been cut back and he is now no longer on beta-blocker or hydralazine therapy.  [name] mildly orthostatic today and continues to have orthostatic lightheadedness.  As such, I have reduced his losartan to 12.5 mg daily.  He remains on torsemide, isosorbide, and empagliflozin.  He has a CardioMEMS and signs of a measurement daily.  He has not been contacted with any abnormal readings recently.  2.  Coronary artery disease: Status post prior circumflex followed by RCA and most recently LAD stenting.  Since his last visit, Mr. Rabinovich has not experienced any chest pain.  His dyspnea is stable.  Remains on nitrate, statin, and Ranexa therapy.  No aspirin in the setting of chronic warfarin.  3.  Orthostatic hypotension: Ongoing and mild today.  [name] bothered by lightheadedness when standing and this is somewhat compounded by peripheral neuropathy and unsteady gait.  He has fallen before but not in 3 months.  As noted above, beta-blocker and hydralazine therapy previously discontinued.  With ongoing orthostatic symptoms and soft blood pressures at rest (108/66), I have asked him to reduce his losartan to 12.5 mg daily.  It is possible that this may need to be discontinued altogether.  4.  Paroxysmal  atrial fibrillation: Quiescent.  [name] regular on exam today.  [name] now off of beta-blocker therapy.  [name] anticoagulated with warfarin.  5.  Stage III-IV chronic kidney disease: Labs were stable in June with a creatinine of 1.73.  6.  Hyperlipidemia: LDL of 65 in April with normal LFTs in June.  Continue statin therapy.  7.  Type 2 diabetes mellitus: Well-controlled with an A1c of 6.2 in August.  8.  Peripheral neuropathy: As noted above, this has been bothersome and has resulted in unsteady gait.  He has been using a cane.  He sees neurology.  9.  Disposition: Follow-up in clinic in 3 months or sooner if necessary.  Patient knows to contact us for ongoing orthostatic symptoms.  [name], NP 11/08/2020, 4:40 PM

## 2020-11-09 ENCOUNTER — Ambulatory Visit: Payer: Medicare HMO

## 2020-11-14 ENCOUNTER — Ambulatory Visit: Payer: Medicare HMO

## 2020-11-15 ENCOUNTER — Ambulatory Visit: Payer: Medicare HMO | Admitting: Pharmacist

## 2020-11-15 DIAGNOSIS — E1121 Type 2 diabetes mellitus with diabetic nephropathy: Secondary | ICD-10-CM

## 2020-11-15 DIAGNOSIS — Z794 Long term (current) use of insulin: Secondary | ICD-10-CM | POA: Diagnosis not present

## 2020-11-15 DIAGNOSIS — I10 Essential (primary) hypertension: Secondary | ICD-10-CM

## 2020-11-15 NOTE — Patient Instructions (Signed)
Visit Information  PATIENT GOALS:  Goals Addressed             This Visit's Progress    Pharmacy Goals       Please check your blood sugar, keep a log of the results and bring this with you to your medical appointments.  Please check your home blood pressure, keep a log of the results and bring this with you to your medical appointments.  Our goal bad cholesterol, or LDL, is less than 70 . This is why it is important to continue taking your rosuvastatin.  Feel free to call me with any questions or concerns. I look forward to our next call!  [name], PharmD, [place] [phone]        The patient verbalized understanding of instructions, educational materials, and care plan provided today and declined offer to receive copy of patient instructions, educational materials, and care plan.   Telephone follow up appointment with care management team member scheduled for: 10/3 at 10:45 am

## 2020-11-15 NOTE — Chronic Care Management (AMB) (Signed)
Chronic Care Management Pharmacy Note  11/15/2020 Name:  Lucas Caldwell MRN:  [id number] DOB:  09/16/1933   Subjective: KOAH CHISENHALL is an 87 y.o. year old male who is a primary patient of [name], DO.  The CCM team was consulted for assistance with disease management and care coordination needs.    Engaged with patient by telephone for follow up visit in response to provider referral for pharmacy case management and/or care coordination services.   Consent to Services:  The patient was given information about Chronic Care Management services, agreed to services, and gave verbal consent prior to initiation of services.  Please see initial visit note for detailed documentation.   Patient Care Team: [name], DO as PCP - General (Family Medicine) End, [name], MD as PCP - Cardiology (Cardiology) Curley Spice, [name], [place] as Pharmacist [name] [name], RN as Case Manager (General Practice) Tat, Eustace Quail, DO as Consulting Physician (Neurology)  Recent consult visits: Office Visit with [facility] Neurology [place] on 8/18 Office Visit with [place] on 8/24 Anti-coag Visit on 8/24  Hospital visits: Patient seen in [facility] ED on 6/1 for bronchitis  Objective:  Lab Results  Component Value Date   CREATININE 1.73 (H) 08/16/2020   CREATININE 1.55 (H) 06/29/2020   CREATININE 1.72 (H) 04/13/2020    Lab Results  Component Value Date   HGBA1C 6.2 (A) 10/16/2020   Last diabetic Eye exam:  Lab Results  Component Value Date/Time   HMDIABEYEEXA Retinopathy (A) 12/07/2019 12:00 AM    Social History   Tobacco Use  Smoking Status Former   Packs/day: 1.75   Years: 20.00   Pack years: 35.00   Types: Cigarettes   Start date: 1955   Quit date: 1975   Years since quitting: 47.6  Smokeless Tobacco Former   BP Readings from Last 3 Encounters:  11/08/20 108/66  10/16/20 (!) 120/55  10/04/20  110/70   Pulse Readings from Last 3 Encounters:  11/08/20 84  10/16/20 80  10/04/20 82   Wt Readings from Last 3 Encounters:  11/08/20 196 lb (88.9 kg)  11/02/20 194 lb (88 kg)  10/16/20 195 lb 3.2 oz (88.5 kg)    Assessment: Review of patient past medical history, allergies, medications, health status, including review of consultants reports, laboratory and other test data, was performed as part of comprehensive evaluation and provision of chronic care management services.   SDOH:  (Social Determinants of Health) assessments and interventions performed:    CCM Care Plan  Allergies  Allergen Reactions   Clopidogrel Anaphylaxis and Other (See Comments)    altered mental status;  (electrolytes "out of wack" and confusion per family; THEY DO NOT REMEMBER ANY OTHER REACTION)- MSB 10/10/15   Ciprofloxacin Itching   Mirtazapine Diarrhea    Bad dreams    Spironolactone Other (See Comments)    gynecomastia    Medications Reviewed Today     Reviewed by Rennis Petty, RPH-CPP (Pharmacist) on 11/15/20 at 1106  Med List Status: <None>   Medication Order Taking? Sig Documenting Provider Last Dose Status Informant  acetaminophen (TYLENOL) 500 MG tablet [id number]  Take 1,000 mg by mouth every 6 (six) hours as needed for moderate pain or headache.  [provider]  Active Spouse/Significant Other  Alcohol Swabs (B-D SINGLE USE SWABS REGULAR) PADS [id number]  Use to check blood sugar up to 2 times daily Karamalegos, [name], DO  Active   allopurinol (ZYLOPRIM)  100 MG tablet [id number]  Take 1 tablet (100 mg total) by mouth daily. Bensimhon, Shaune Pascal, MD  Active   aspirin 81 MG chewable tablet [id number]  Chew 81 mg by mouth daily. [provider]  Active Spouse/Significant Other  Blood Glucose Monitoring Suppl (TRUE METRIX METER) DEVI [id number]  Use to check blood sugar up to 2 times daily [name], DO  Active   bosutinib (BOSULIF) 100 MG tablet  [id number]  Take 100 mg by mouth at bedtime. Take with food. [provider]  Active            Med Note Kenton Kingfisher, Germaine Pomfret Sep 15, 2019  8:38 AM)    Cholecalciferol 25 MCG (1000 UT) capsule [id number]  Take 1,000 Units by mouth daily. [provider]  Active   empagliflozin (JARDIANCE) 10 MG TABS tablet [id number]  Take 1 tablet (10 mg total) by mouth daily with breakfast. Please make an appointment for further refills Bensimhon, Shaune Pascal, MD  Active   ferrous sulfate 325 (65 FE) MG tablet [id number]  Take by mouth. [provider]  Active   gabapentin (NEURONTIN) 300 MG capsule [id number]  TAKE TWO CAPSULES BY MOUTH TWICE DAILY MAX 4 CAPSULE PER DAY - FOR KIDNEY PROTECTION [name], DO  Active   Insulin Glargine ([name] KWIKPEN) 100 UNIT/ML [id number]  Inject 20 Units into the skin daily. [provider]  Active   Insulin Pen Needle 31G X 5 MM MISC [id number]  31 g by Does not apply route as directed. [name], NP  Active Spouse/Significant Other  isosorbide mononitrate (IMDUR) 60 MG 24 hr tablet [id number] Yes Take 1 tablet (60 mg total) by mouth daily. End, [name], MD Taking Active   levothyroxine (SYNTHROID) 25 MCG tablet [id number]  TAKE ONE TABLET BY MOUTH EVERY MORNING TAKE BEFORE BREAKFAST Karamalegos, Alexander Lenna Sciara, DO  Active   losartan (COZAAR) 25 MG tablet [id number] Yes Take 0.5 tablets (12.5 mg total) by mouth daily. Theora Gianotti, NP Taking Active   Menthol-Zinc Oxide (GOLD BOND EX) [id number]  Apply 1 application topically daily as needed (muscle pain). [provider]  Active   mirabegron ER (MYRBETRIQ) 25 MG TB24 tablet [id number]  Take 1 tablet (25 mg total) by mouth daily. From Urology Debroah Loop, PA-C  Active   Multiple Vitamin (MULTIVITAMIN WITH MINERALS) TABS tablet [id number]  Take 1 tablet by mouth daily. [provider]  Active Spouse/Significant Other  nitroGLYCERIN  (NITROSTAT) 0.4 MG SL tablet [id number]  Place 1 tablet (0.4 mg total) under the tongue every 5 (five) minutes x 3 doses as needed for chest pain. Theora Gianotti, NP  Active   omeprazole (PRILOSEC) 20 MG capsule [id number]  Take 20 mg by mouth in the morning.  [provider]  Active Spouse/Significant Other  Polyethyl Glycol-Propyl Glycol (SYSTANE OP) [id number]  Place 1 drop into both eyes 3 (three) times daily as needed (dry/irritated eyes.).  [provider]  Active Spouse/Significant Other  polyethylene glycol (MIRALAX / GLYCOLAX) 17 g packet [id number]  Take 17 g by mouth daily as needed for mild constipation.  [provider]  Active Spouse/Significant Other  potassium chloride (KLOR-CON) 10 MEQ tablet [id number]  Take 1 tablet (10 mEq total) by mouth daily. Karamalegos, [name], DO  Active   ranolazine (RANEXA) 500 MG 12 hr tablet [id number] Yes TAKE ONE TABLET BY MOUTH TWICE DAILY End, [name], MD Taking Active   rosuvastatin (CRESTOR) 5 MG tablet  [id number]  TAKE ONE TABLET BY MOUTH ONCE DAILY End, [name], MD  Active   simethicone (MYLICON) 80 MG chewable tablet [id number]  Chew 80 mg by mouth every 6 (six) hours as needed for flatulence. [provider]  Active   tamsulosin (FLOMAX) 0.4 MG CAPS capsule [id number]  Take 1 capsule (0.4 mg total) by mouth daily after supper. Rise Mu, PA-C  Active   torsemide (DEMADEX) 20 MG tablet [id number] Yes Take 3 tablets (60 mg total) by mouth daily. Bensimhon, Shaune Pascal, MD Taking Active   traMADol (ULTRAM) 50 MG tablet [id number]  TAKE 1 OR 2 TABLETS BY MOUTH AT BEDTIME AS NEEDED [name], DO  Active   TRULICITY 1.5 PR/9.1MB SOPN [id number]  Inject 1.5 mg into the skin once a week. [name], DO  Active            Med Note [facility], BRANDY L   Thu Jun 29, 2020  1:45 PM)    warfarin (COUMADIN) 3 MG tablet [id number]  TAKE 1 TO 1 & 1/2 TABLETS BY MOUTH AS DIRECTED  BY THE COUMADIN CLINIC End, [name], MD  Active             Patient Active Problem List   Diagnosis Date Noted   Orthostatic hypotension 10/16/2020   Obstructive sleep apnea 06/29/2020   Urge incontinence of urine 12/22/2019   Trifascicular block 10/20/2019   Wound infection 10/08/2019   Bifascicular block 05/13/2019   Coronary stent patent 04/13/2019   Chronic HFrEF (heart failure with reduced ejection fraction) ([place]) 03/19/2019   PSVT (paroxysmal supraventricular tachycardia) ([place]) 03/19/2019   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 ([place]) 08/24/2018   CKD (chronic kidney disease), stage IV ([place]) 06/04/2018   Osteoarthritis of knees, bilateral 06/02/2018   Hyperlipidemia associated with type 2 diabetes mellitus ([place]) 05/06/2018   Pain of lower extremity 03/06/2018   Duodenitis 01/30/2018   Chronic heart failure with preserved ejection fraction (HFpEF) ([place]) 01/23/2018   Hematochezia    Melena    Rectal bleeding 01/11/2018   AVM (arteriovenous malformation) of colon without hemorrhage    Schatzki's ring    Esophageal dysphagia    Diabetic retinopathy ([place]) 07/08/2017   Ischemic cardiomyopathy 06/26/2017   Cardiomyopathy ([place]) 06/26/2017   Centrilobular emphysema ([place]) 06/13/2017   Fatigue 12/18/2016   Stable angina ([place]) 09/20/2016   Stage 3a chronic kidney disease ([place]) 09/20/2016   Paroxysmal atrial fibrillation ([place]) 08/28/2016   Chronic anticoagulation 08/28/2016   Essential hypertension 08/20/2016   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 heart with stable angina pectoris ([place]) 08/19/2016   Accelerating angina ([place]) 08/15/2016   Exertional dyspnea 08/12/2016   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) 04/09/2013   Cataract of left eye 11/25/2011   Corneal opacity 09/30/2011   Pseudophakia of right eye 09/30/2011   GERD (gastroesophageal reflux disease) 09/25/2011   Gout 09/25/2011   Hyperlipidemia LDL goal <70 09/25/2011   Status post cataract extraction 04/11/2011   Cataract extraction status of eye 04/11/2011   Iron deficiency anemia 04/01/2011   Benign localized hyperplasia of prostate without urinary obstruction and other lower urinary tract symptoms (LUTS) 12/10/2010   Enlarged  prostate without lower urinary tract symptoms (luts) 12/10/2010   Colon cancer screening 10/11/2010   Hypothyroidism 10/11/2010   Hyperlipidemia 09/20/2003   Essential and other specified forms of tremor 01/05/2001   Essential tremor 01/05/2001    Immunization History  Administered Date(s) Administered   Influenza, High Dose Seasonal PF 12/29/2014, 12/19/2016, 12/22/2017   Influenza, Seasonal, Injecte, Preservative Fre 01/13/2007, 12/13/2008, 01/30/2010, 12/26/2011   Influenza,inj,Quad PF,6+ Mos 12/25/2012, 12/09/2015, 11/26/2018, 12/15/2019   PFIZER(Purple Top)SARS-COV-2 Vaccination 05/13/2019, 06/08/2019, 12/15/2019, 05/19/2020   PPD Test 09/25/2010   Pneumococcal Conjugate-13 01/31/2014   Pneumococcal Polysaccharide-23 06/20/2008, 09/02/2010    Conditions to be addressed/monitored: T2DM, CKD, CAD, HF, HLD, atrial fibrillation, OSA, HTN, hypothyroidism  Care Plan : PharmD - Medication Management/Assistance  Updates made by Rennis Petty, RPH-CPP since 11/15/2020 12:00 AM     Problem: Disease Progression      Long-Range Goal: Disease Progression Prevented or Minimized   Start Date: 04/14/2020  Expected End Date: 07/13/2020  This Visit's Progress: On track  Recent Progress: On track  Priority: High  Note:   Current Barriers:  Chronic Disease Management support, education, and care coordination needs related to type 2  diabetes, CKD, HFrEF s/p CardioMEMS, hyperlipidemia, atrial fibrillation, hypertension, coronary artery disease, CHF, hypothyroidism, and CML Financial Note patient has Partial Extra Help/LIS Subsidy Patient enrolled in [place] [employer] health practitioner and [place]) patient assistance program for 2022 calendar year Patient denied for patient assistance for [place] from [employer] for 2022 calendar year Patient enrolled in [place] for [place] copayment assistance (eligibility through 08/28/2021) Limited vision  Pharmacist Clinical Goal(s):  Over the next 90 days, patient will verbalize ability to afford treatment regimen through collaboration with PharmD and provider.   Interventions: 1:1 collaboration with [name], DO regarding development and update of comprehensive plan of care as evidenced by provider attestation and co-signature Inter-disciplinary care team collaboration (see longitudinal plan of care) Perform chart review Office Visit with [facility] Neurology [place] on 8/18. Provider advised patient: Start using compression stockings Discussed hydration Office Visit with [place] on 8/24. Provider advised patient to: Reduce losartan to 25 mg- take 1/2 tablet (12.5 mg) by mouth once daily Anti-coag Visit on 8/24 Today spouse confirms losartan dose as directed by Cardiologist  Reports last checked BP on 8/26: 108/66, HR 84 Encourage to restart monitoring home BP, keep log of results and bring record with patient to upcoming medical appointments Reports patient wearing compression stockings as recommended by Neurologist and patient says has noticed legs feeling somewhat better Reports patient has made an appointment with [facility] in [place] on 9/2 to address his neuropathy symptoms  Type 2 Diabetes: Controlled; current treatment: [name] 18 units daily Trulicity 1.5 mg once weekly Jardiance 10 mg once  daily in morning Patient reports fasting blood sugar today: 145 Have discussed following PCP's direction to gradually reduce dose by 1 unit per week if fasting CBG < 150 consistently.  Denies episodes of hypoglycemia Have encouraged patient to have regular well-balanced meals   Patient Goals/Self-Care Activities Over the next 90 days, patient will:  Check blood sugar, blood pressure and fluid status as directed by providers  Take medications, with assistance of wife, as directed Mrs. Rodocker manages patient's medications using weekly pillbox as adherence tool Attends scheduled medical appointments  Follow Up Plan: Telephone follow up appointment with care management team member scheduled for: 10/3 at 10:45 am      Patient's preferred pharmacy is:  [facility] -  [place], [place], [place] [address] Phone: [phone] Fax: [phone]  [place], [place] [address] [place] [address] Phone: [phone] Fax: [phone]  [facility] ([facility]) - [place], [place] [address] Phone: [phone] Fax: [phone]  [facility] [place], [place] - [address] [place] [address] Phone: [phone] Fax: [phone]   Follow Up:  Patient agrees to Care Plan and Follow-up.  [name], PharmD, Para March, CPP Clinical Pharmacist [facility] [phone]

## 2020-11-16 ENCOUNTER — Ambulatory Visit: Payer: Medicare HMO

## 2020-11-16 ENCOUNTER — Ambulatory Visit: Admit: 2020-11-16 | Discharge: 2020-11-17 | Payer: MEDICARE

## 2020-11-16 ENCOUNTER — Ambulatory Visit
Admit: 2020-11-16 | Discharge: 2020-11-17 | Payer: MEDICARE | Attending: Hematology & Oncology | Primary: Hematology & Oncology

## 2020-11-16 ENCOUNTER — Other Ambulatory Visit: Admit: 2020-11-16 | Discharge: 2020-11-17 | Payer: MEDICARE

## 2020-11-16 DIAGNOSIS — C9210 Chronic myeloid leukemia, BCR/ABL-positive, not having achieved remission: Principal | ICD-10-CM

## 2020-11-17 ENCOUNTER — Telehealth: Payer: Self-pay

## 2020-11-17 NOTE — Telephone Encounter (Signed)
Can you please follow-up with patient to figure out more information?  He has a Art therapist and a Garment/textile technologist already. They both have seen him within past 3 months. I am not sure reason for requesting new doctor.  Neurology = Dr Carles Collet [facility] [place])  Podiatry = Dr Luana Shu [facility])  [name], [facility] 11/17/2020, 4:21 PM

## 2020-11-17 NOTE — Telephone Encounter (Signed)
Copied from [place] [id number]. Topic: Referral - Request for Referral >> Nov 17, 2020 12:46 PM Tessa Lerner A wrote: Has patient seen PCP for this complaint? No.  *If NO, is insurance requiring patient see PCP for this issue before PCP can refer them?  Referral for which specialty: Neurologist   Preferred provider/office: Patient has no preference   Reason for referral: Patient has concerns related to neuropathy in their feet

## 2020-11-21 ENCOUNTER — Ambulatory Visit: Payer: Medicare HMO

## 2020-11-21 ENCOUNTER — Telehealth: Payer: Self-pay | Admitting: Family Medicine

## 2020-11-21 NOTE — Telephone Encounter (Signed)
Left message for patient to call back and schedule the Medicare Annual Wellness Visit (AWV) virtually or by telephone.  Last AWV 11/24/18  Please schedule at anytime with [place].  40 minute appointment  Any questions, please call me at [phone]

## 2020-11-23 ENCOUNTER — Ambulatory Visit (INDEPENDENT_AMBULATORY_CARE_PROVIDER_SITE_OTHER): Payer: Medicare HMO | Admitting: Family Medicine

## 2020-11-23 ENCOUNTER — Other Ambulatory Visit: Payer: Self-pay

## 2020-11-23 ENCOUNTER — Ambulatory Visit: Payer: Medicare HMO

## 2020-11-23 ENCOUNTER — Encounter: Payer: Self-pay | Admitting: Family Medicine

## 2020-11-23 VITALS — BP 132/56 | HR 76 | Ht 64.0 in | Wt 192.4 lb

## 2020-11-23 DIAGNOSIS — Z89411 Acquired absence of right great toe: Secondary | ICD-10-CM | POA: Diagnosis not present

## 2020-11-23 DIAGNOSIS — Z794 Long term (current) use of insulin: Secondary | ICD-10-CM | POA: Diagnosis not present

## 2020-11-23 DIAGNOSIS — E1121 Type 2 diabetes mellitus with diabetic nephropathy: Secondary | ICD-10-CM

## 2020-11-23 DIAGNOSIS — E1142 Type 2 diabetes mellitus with diabetic polyneuropathy: Secondary | ICD-10-CM | POA: Diagnosis not present

## 2020-11-23 NOTE — Patient Instructions (Addendum)
Thank you for coming to the office today.  Alpha Lipoic Acid supplement try this OTC for neuropathy  Referral to [facility] Neurology and see if they can help more with the Neuropathy.  [facility] - Neurology Dept [place], [place] [address] Phone: [phone]  I don't have any other medicines to offer to help reduce the neuropathy.  I am not confident that a dietary change will fix this problem.  Please schedule a Follow-up Appointment to: Return if symptoms worsen or fail to improve.  If you have any other questions or concerns, please feel free to call the office or send a message through [place]. You may also schedule an earlier appointment if necessary.  Additionally, you may be receiving a survey about your experience at our office within a few days to 1 week by e-mail or mail. We value your feedback.  [name], DO [place]

## 2020-11-23 NOTE — Progress Notes (Signed)
Subjective:    Patient ID: Lucas Caldwell, male    DOB: Jul 05, 1933, 87 y.o.   MRN: [id number]  Lucas Caldwell is a 87 y.o. male presenting on 11/23/2020 for Foot Swelling and Foot Pain   HPI  Dizziness He has seen Neuro and done Neuro PT vestibular without resolution  CHRONIC DM, Type 2 / CKD-III Nephropathy and Neuropathy Partial Ray Amputation R Great Toe On med management for DM, improved sugars. Has diabetic shoes Followed by Podiatry currently, previously with Neurology in [place]. He requests another opinion from neurology on neuropathy On Gabapentin currently. Has failed lyrica in past due to side effect On Tramadol PRN Limited benefit from neuropathy.  Depression screen [facility] [facility] 2/9 10/02/2020 07/28/2020 12/22/2019  Decreased Interest 0 0 0  Down, Depressed, Hopeless 0 0 0  PHQ - 2 Score 0 0 0  Altered sleeping - 2 -  Tired, decreased energy - 0 -  Change in appetite - 0 -  Feeling bad or failure about yourself  - 0 -  Trouble concentrating - 0 -  Moving slowly or fidgety/restless - 0 -  Suicidal thoughts - 0 -  PHQ-9 Score - 2 -  Difficult doing work/chores - - -  Some recent data might be hidden    Social History   Tobacco Use   Smoking status: Former    Packs/day: 1.75    Years: 20.00    Pack years: 35.00    Types: Cigarettes    Start date: 1955    Quit date: 1975    Years since quitting: 47.7   Smokeless tobacco: Former  Scientific laboratory technician Use: Never used  Substance Use Topics   Alcohol use: No    Comment: previously drank heavily - quit 1979.   Drug use: No    Review of Systems Per HPI unless specifically indicated above     Objective:    BP (!) 132/56   Pulse 76   Ht 5\' 4"  (1.626 m)   Wt 192 lb 6.4 oz (87.3 kg)   SpO2 98%   BMI 33.03 kg/m   Wt Readings from Last 3 Encounters:  11/23/20 192 lb 6.4 oz (87.3 kg)  11/08/20 196 lb (88.9 kg)  11/02/20 194 lb (88 kg)    Physical Exam Vitals and nursing note reviewed.   Constitutional:      General: [name] not in acute distress.    Appearance: Normal appearance. [name] well-developed. [name] not diaphoretic.     Comments: Well-appearing, comfortable, cooperative  HENT:     Head: Normocephalic and atraumatic.  Eyes:     General:        Right eye: No discharge.        Left eye: No discharge.     Conjunctiva/sclera: Conjunctivae normal.  Cardiovascular:     Rate and Rhythm: Normal rate.  Pulmonary:     Effort: Pulmonary effort is normal.  Skin:    General: Skin is warm and dry.     Findings: No erythema or rash.  Neurological:     Mental Status: [name] alert and oriented to person, place, and time.  Psychiatric:        Mood and Affect: Mood normal.        Behavior: Behavior normal.        Thought Content: Thought content normal.     Comments: Well groomed, good eye contact, normal speech and thoughts     Diabetic Foot Exam -  Simple   Simple Foot Form Diabetic Foot exam was performed with the following findings: Yes 11/23/2020 11:45 AM  Visual Inspection See comments: Yes Sensation Testing See comments: Yes Pulse Check Posterior Tibialis and Dorsalis pulse intact bilaterally: Yes Comments Right foot partial great toe amputation well healed. Some callus formation. Dry skin. Reduced sensation to monofilament. No ulceration      Results for orders placed or performed in visit on 11/08/20  POCT INR  Result Value Ref Range   INR 2.1 2.0 - 3.0   *Note: Due to a large number of results and/or encounters for the requested time period, some results have not been displayed. A complete set of results can be found in Results Review.      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 On gabapentin, failed lyrica, has partial R toe amputation, has seen [facility]. Needs further testing and evaluation and management of diabetic neuropathy.    Orders Placed This Encounter  Procedures   Ambulatory referral to Neurology    Referral Priority:   Routine    Referral Type:   Consultation    Referral Reason:   Specialty Services Required    Requested Specialty:   Neurology    Number of Visits Requested:   1     No orders of the defined types were placed in this encounter.     Follow up plan: Return if symptoms worsen or fail to improve.   [name], [facility] 11/23/2020, 11:42 AM

## 2020-11-24 DIAGNOSIS — I10 Essential (primary) hypertension: Secondary | ICD-10-CM | POA: Diagnosis not present

## 2020-11-24 DIAGNOSIS — E1129 Type 2 diabetes mellitus with other diabetic kidney complication: Secondary | ICD-10-CM | POA: Diagnosis not present

## 2020-11-24 DIAGNOSIS — N183 Chronic kidney disease, stage 3 unspecified: Secondary | ICD-10-CM | POA: Diagnosis not present

## 2020-11-28 ENCOUNTER — Ambulatory Visit: Payer: Medicare HMO

## 2020-11-29 ENCOUNTER — Telehealth (HOSPITAL_COMMUNITY): Payer: Self-pay | Admitting: Adult Health

## 2020-11-29 NOTE — Telephone Encounter (Signed)
   Attempted to call and ask to repeat cardiomems.   No answer on home and mobile phone.   Miciah Covelli NP-C  3:03 PM

## 2020-11-30 ENCOUNTER — Ambulatory Visit: Payer: Self-pay | Admitting: *Deleted

## 2020-11-30 DIAGNOSIS — I10 Essential (primary) hypertension: Secondary | ICD-10-CM | POA: Diagnosis not present

## 2020-11-30 DIAGNOSIS — E1122 Type 2 diabetes mellitus with diabetic chronic kidney disease: Secondary | ICD-10-CM | POA: Diagnosis not present

## 2020-11-30 DIAGNOSIS — N1832 Chronic kidney disease, stage 3b: Secondary | ICD-10-CM | POA: Diagnosis not present

## 2020-11-30 DIAGNOSIS — N400 Enlarged prostate without lower urinary tract symptoms: Secondary | ICD-10-CM | POA: Diagnosis not present

## 2020-11-30 NOTE — Telephone Encounter (Signed)
Per agent:  "Pts wife, Bertin Inabinet, calling on behalf of pt. She states that the pt is experiencing worsening pain in his arms when trying to lift them. She states that this has been going on x1 month and is wanting to have advice on what to do to help him. Please advise. "  Pt's wife calling, asked to speak to pt, wife on speaker. Pt reports bilateral shoulder pain , occurs only when lifting arms "Can't lift them over my head." States onset 1 month ago, worsening since Monday. Pain does not radiate, denies swelling. No injury, no overuse. Requesting appt, next available 12/12/20, declines. Wife requesting XRay of shoulders and advice from Dr. Parks Ranger. Assured pt NT would route to practice for PCPs review and final disposition. ADvised ED for worsening symptoms. Verbalizes understanding.  CB# [phone]       Reason for Disposition  Shoulder pain is a chronic symptom (recurrent or ongoing AND present > 4 weeks)  Answer Assessment - Initial Assessment Questions 1. ONSET: "When did the pain start?"      1 month ago 2. LOCATION: "Where is the pain located?"     Both shoulders "Only when lifting my arms." 3. PAIN: "How bad is the pain?" (Scale 1-10; or mild, moderate, severe)   - MILD (1-3): doesn't interfere with normal activities   - MODERATE (4-7): interferes with normal activities (e.g., work or school) or awakens from sleep   - SEVERE (8-10): excruciating pain, unable to do any normal activities, unable to move arm at all due to pain     8/10 4. WORK OR EXERCISE: "Has there been any recent work or exercise that involved this part of the body?"     no 5. CAUSE: "What do you think is causing the shoulder pain?"     Unsure 6. OTHER SYMPTOMS: "Do you have any other symptoms?" (e.g., neck pain, swelling, rash, fever, numbness, weakness) Arms weaker  Protocols used: Shoulder Pain-A-AH

## 2020-12-01 ENCOUNTER — Other Ambulatory Visit: Payer: Self-pay | Admitting: Internal Medicine

## 2020-12-01 ENCOUNTER — Ambulatory Visit: Payer: Medicare HMO

## 2020-12-01 NOTE — Telephone Encounter (Signed)
Prescription refill request received for warfarin [place]: 10/04/2020 Next INR check: 10/5 Warfarin tablet strength: 3mg 

## 2020-12-01 NOTE — Telephone Encounter (Signed)
Please review for refill, thanks ! 

## 2020-12-01 NOTE — Telephone Encounter (Signed)
I scheduled the patient for an appointment on Wednesday, Sept 21 @ 11:20am.

## 2020-12-06 ENCOUNTER — Ambulatory Visit: Payer: Medicare HMO

## 2020-12-06 ENCOUNTER — Ambulatory Visit: Payer: Medicare HMO | Admitting: Family Medicine

## 2020-12-07 ENCOUNTER — Ambulatory Visit
Admission: RE | Admit: 2020-12-07 | Discharge: 2020-12-07 | Disposition: A | Discharge status: Home / Self Care | Payer: Medicare HMO | Attending: Family Medicine | Admitting: Family Medicine

## 2020-12-07 ENCOUNTER — Ambulatory Visit
Admission: RE | Admit: 2020-12-07 | Discharge: 2020-12-07 | Disposition: A | Discharge status: Home / Self Care | Payer: Medicare HMO | Source: Ambulatory Visit | Attending: Family Medicine | Admitting: Family Medicine

## 2020-12-07 ENCOUNTER — Other Ambulatory Visit: Payer: Self-pay

## 2020-12-07 ENCOUNTER — Other Ambulatory Visit: Payer: Self-pay | Admitting: Family Medicine

## 2020-12-07 DIAGNOSIS — G8929 Other chronic pain: Secondary | ICD-10-CM

## 2020-12-07 DIAGNOSIS — M25512 Pain in left shoulder: Secondary | ICD-10-CM | POA: Diagnosis not present

## 2020-12-07 DIAGNOSIS — R2689 Other abnormalities of gait and mobility: Secondary | ICD-10-CM | POA: Diagnosis not present

## 2020-12-07 DIAGNOSIS — R270 Ataxia, unspecified: Secondary | ICD-10-CM | POA: Diagnosis not present

## 2020-12-07 DIAGNOSIS — R251 Tremor, unspecified: Secondary | ICD-10-CM | POA: Diagnosis not present

## 2020-12-07 DIAGNOSIS — M19011 Primary osteoarthritis, right shoulder: Secondary | ICD-10-CM | POA: Diagnosis not present

## 2020-12-07 DIAGNOSIS — M25511 Pain in right shoulder: Secondary | ICD-10-CM | POA: Insufficient documentation

## 2020-12-07 DIAGNOSIS — G629 Polyneuropathy, unspecified: Secondary | ICD-10-CM | POA: Diagnosis not present

## 2020-12-07 DIAGNOSIS — E119 Type 2 diabetes mellitus without complications: Secondary | ICD-10-CM | POA: Diagnosis not present

## 2020-12-08 ENCOUNTER — Ambulatory Visit: Payer: Medicare HMO

## 2020-12-11 ENCOUNTER — Telehealth: Payer: Self-pay

## 2020-12-11 ENCOUNTER — Encounter: Payer: Self-pay | Admitting: Family Medicine

## 2020-12-11 ENCOUNTER — Ambulatory Visit (INDEPENDENT_AMBULATORY_CARE_PROVIDER_SITE_OTHER): Payer: Medicare HMO | Admitting: Family Medicine

## 2020-12-11 ENCOUNTER — Other Ambulatory Visit: Payer: Self-pay

## 2020-12-11 ENCOUNTER — Telehealth: Payer: Medicare HMO

## 2020-12-11 VITALS — BP 148/75 | HR 79 | Ht 64.0 in | Wt 194.2 lb

## 2020-12-11 DIAGNOSIS — M67911 Unspecified disorder of synovium and tendon, right shoulder: Secondary | ICD-10-CM | POA: Diagnosis not present

## 2020-12-11 DIAGNOSIS — M25512 Pain in left shoulder: Secondary | ICD-10-CM

## 2020-12-11 DIAGNOSIS — Z23 Encounter for immunization: Secondary | ICD-10-CM

## 2020-12-11 DIAGNOSIS — M25511 Pain in right shoulder: Secondary | ICD-10-CM

## 2020-12-11 DIAGNOSIS — J0100 Acute maxillary sinusitis, unspecified: Secondary | ICD-10-CM | POA: Diagnosis not present

## 2020-12-11 DIAGNOSIS — G8929 Other chronic pain: Secondary | ICD-10-CM | POA: Diagnosis not present

## 2020-12-11 MED ORDER — AMOXICILLIN-POT CLAVULANATE 875-125 MG PO TABS: 1.0000 | ORAL_TABLET | Freq: Two times a day (BID) | ORAL | 0 refills | Status: DC

## 2020-12-11 MED ORDER — LIDOCAINE HCL (PF) 1 % IJ SOLN
4.0000 mL | Freq: Once | INTRAMUSCULAR | Status: AC
Start: 2020-12-11 — End: 2020-12-11
  Administered 2020-12-11: 4 mL

## 2020-12-11 MED ORDER — METHYLPREDNISOLONE ACETATE 40 MG/ML IJ SUSP
40.0000 mg | Freq: Once | INTRAMUSCULAR | Status: AC
  Administered 2020-12-11: 40 mg via INTRA_ARTICULAR

## 2020-12-11 NOTE — Progress Notes (Signed)
Subjective:    Patient ID: Lucas Caldwell, male    DOB: May 06, 1933, 87 y.o.   MRN: [id number]  Lucas Caldwell is a 87 y.o. male presenting on 12/11/2020 for Shoulder Pain   HPI  Acute sinusitis / cough Recent increased sinus drainage and into chest with congestion. Some productive cough.  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 He has done heavy lifting work for years. He has had issues with shoulder range of motion for several years now as well. Worse lately. He has to use other arm to lift arm up above at a shoulder 90 * level.   Health Maintenance: Due for Flu Shot today  Depression screen [facility] 2/9 10/02/2020 07/28/2020 12/22/2019  Decreased Interest 0 0 0  Down, Depressed, Hopeless 0 0 0  PHQ - 2 Score 0 0 0  Altered sleeping - 2 -  Tired, decreased energy - 0 -  Change in appetite - 0 -  Feeling bad or failure about yourself  - 0 -  Trouble concentrating - 0 -  Moving slowly or fidgety/restless - 0 -  Suicidal thoughts - 0 -  PHQ-9 Score - 2 -  Difficult doing work/chores - - -  Some recent data might be hidden    Social History   Tobacco Use   Smoking status: Former    Packs/day: 1.75    Years: 20.00    Pack years: 35.00    Types: Cigarettes    Start date: 1955    Quit date: 1975    Years since quitting: 47.7   Smokeless tobacco: Former  Scientific laboratory technician Use: Never used  Substance Use Topics   Alcohol use: No    Comment: previously drank heavily - quit 1979.   Drug use: No    Review of Systems Per HPI unless specifically indicated above     Objective:    BP (!) 148/75   Pulse 79   Ht 5\' 4"  (1.626 m)   Wt 194 lb 3.2 oz (88.1 kg)   SpO2 100%   BMI 33.33 kg/m   Wt Readings from Last 3 Encounters:  12/11/20 194 lb 3.2 oz (88.1 kg)  11/23/20 192 lb 6.4 oz (87.3 kg)  11/08/20 196 lb (88.9 kg)    Physical  Exam Vitals and nursing note reviewed.  Constitutional:      General: [name] not in acute distress.    Appearance: [name] well-developed. [name] not diaphoretic.     Comments: Well-appearing, comfortable, cooperative  HENT:     Head: Normocephalic and atraumatic.  Eyes:     General:        Right eye: No discharge.        Left eye: No discharge.     Conjunctiva/sclera: Conjunctivae normal.  Neck:     Thyroid: No thyromegaly.  Cardiovascular:     Rate and Rhythm: Normal rate and regular rhythm.     Pulses: Normal pulses.     Heart sounds: Normal heart sounds. No murmur heard. Pulmonary:     Effort: Pulmonary effort is normal. No respiratory distress.     Breath sounds: Rhonchi present. No wheezing or rales.     Comments: Coarse breath sound Musculoskeletal:     Cervical back: Normal range of motion and neck supple.     Comments: Reduced motion of bilateral shoulders,  unable to lift shoulders bilateral without using other shoulder to support, initial motion is limited  Lymphadenopathy:     Cervical: No cervical adenopathy.  Skin:    General: Skin is warm and dry.     Findings: No erythema or rash.  Neurological:     Mental Status: [name] alert and oriented to person, place, and time. Mental status is at baseline.  Psychiatric:        Behavior: Behavior normal.     Comments: Well groomed, good eye contact, normal speech and thoughts   ________________________________________________________ PROCEDURE NOTE Date: 12/11/20 Subacromial steroid injection Discussed benefits and risks (including pain, bleeding, infection, steroid flare). Verbal consent given by patient. Medication:  1 cc Depo-medrol 40mg  and 4 cc Lidocaine 1% without epi Time Out taken  Landmarks identified. Area cleansed with alcohol wipes. Using 21 gauge and 1, 1/2 inch needle, Right subacromial bursa space was injected (with above listed medication) via posterior approach cold spray used for superficial anesthetic.  Sterile bandage placed. Patient tolerated procedure well without bleeding or paresthesias. No complications.   I have personally reviewed the radiology report from X-ray Shoulders on 12/07/20.  CLINICAL DATA:  Bilateral shoulder pain and limited range of motion.   EXAM: RIGHT SHOULDER - 3 VIEW   COMPARISON:  None.   FINDINGS: No fracture or bone lesion.   Glenohumeral joint normally spaced and aligned without arthropathic changes.   Narrowed subacromial space to 4 mm.   Minor marginal spurring at the [facility] joint with joint space narrowing.   Soft tissues are unremarkable.   IMPRESSION: 1. No fracture or acute finding. 2. Narrowed subacromial space consistent with a chronic full-thickness rotator cuff tear. 3. Mild AC joint osteoarthritis.     Electronically Signed   By: Lajean Manes M.D.   On: 12/09/2020 11:45  ------------------------------  CLINICAL DATA:  88 year old male with bilateral shoulder pain.   EXAM: LEFT SHOULDER - 2+ VIEW   COMPARISON:  10/09/2017   FINDINGS: There is no evidence of fracture or dislocation. There is no evidence of arthropathy or other focal bone abnormality. Soft tissues are unremarkable.   IMPRESSION: No acute fracture, malalignment, or significant degenerative change.     Electronically Signed   By: Ruthann Cancer M.D.   On: 12/09/2020 11:43   Results for orders placed or performed in visit on 11/08/20  POCT INR  Result Value Ref Range   INR 2.1 2.0 - 3.0   *Note: Due to a large number of results and/or encounters for the requested time period, some results have not been displayed. A complete set of results can be found in Results Review.      Assessment & Plan:   Problem List Items Addressed This Visit   None Visit Diagnoses     Chronic pain of both shoulders    -  Primary   Relevant Medications   lidocaine (PF) (XYLOCAINE) 1 % injection 4 mL (Completed)   methylPREDNISolone acetate (DEPO-MEDROL) injection 40 mg  (Completed)   Tendinopathy of right rotator cuff       Relevant Medications   lidocaine (PF) (XYLOCAINE) 1 % injection 4 mL (Completed)   methylPREDNISolone acetate (DEPO-MEDROL) injection 40 mg (Completed)   Needs flu shot       Relevant Orders   Flu Vaccine QUAD High Dose(Fluad) (Completed)   Acute non-recurrent maxillary sinusitis       Relevant Medications   methylPREDNISolone acetate (DEPO-MEDROL) injection 40 mg (Completed)   amoxicillin-clavulanate (AUGMENTIN) 875-125 MG  tablet        Consistent with acute maxillary with progression to lower chest congestion  Plan: 1 Start Augmentin 875-125mg  PO BID x 10 days Return criteria reviewed  Consistent with subacute bilateral R>L shoulder bursitis vs rotator cuff tendinopathy with dramatic reduced active ROM and with weakness. Known repetitive overhead/strenuous activity as likely etiology   X-rays reviewed. Show evidence of chronic rotator cuff tendinopathy with likely old full thickeness tear suggested on x-ray R sided.  Plan: Right shoulder subacromial steroid injection performed today, see procedure note for details. Goal to improve range of motion with injection can do L side anytime if ready if this is successful, otherwise would defer to Orthopedic management.  Relative rest but keep shoulder mobile, demonstrated ROM exercises, avoid heavy lifting May try heating pad PRN Follow-up 4-6 weeks if not improved for re-evaluation - consider Orthopedics next given concern for structural damage to rotator cuff     Meds ordered this encounter  Medications   lidocaine (PF) (XYLOCAINE) 1 % injection 4 mL   methylPREDNISolone acetate (DEPO-MEDROL) injection 40 mg   amoxicillin-clavulanate (AUGMENTIN) 875-125 MG tablet    Sig: Take 1 tablet by mouth 2 (two) times daily. For 10 days    Dispense:  20 tablet    Refill:  0      Follow up plan: Return in about 3 months (around 03/12/2021) for 2-3 months - within mid to end  December 2022 DM A1c, Shoulder pain.   [name], [facility] 12/11/2020, 8:34 AM

## 2020-12-11 NOTE — Patient Instructions (Addendum)
Thank you for coming to the office today.  You received a Right Shoulder Joint steroid injection today. - Lidocaine numbing medicine may ease the pain initially for a few hours until it wears off - As discussed, you may experience a "steroid flare" this evening or within 24-48 hours, anytime medicine is injected into an inflamed joint it can cause the pain to get worse temporarily - Everyone responds differently to these injections, it depends on the patient and the severity of the joint problem, it may provide anywhere from days to weeks, to months of relief. Ideal response is >6 months relief - Try to take it easy for next 1-2 days, avoid over activity and strain on joint (limit lifting for shoulder) - Recommend the following:   - For swelling - rest, compression sleeve / ACE wrap, elevation, and ice packs as needed for first few days   - For pain in future may use heating pad or moist heat as needed  - Given the nature of your injury duration of your pain/injury, I am concerned for torn totator cuff muscle in your shoulder. If not improving as expected over next several weeks we can refer to Orthopedics   Please schedule a Follow-up Appointment to: Return in about 3 months (around 03/12/2021) for 2-3 months - within mid to end December 2022 DM A1c, Shoulder pain.  If you have any other questions or concerns, please feel free to call the office or send a message through [place]. You may also schedule an earlier appointment if necessary.  Additionally, you may be receiving a survey about your experience at our office within a few days to 1 week by e-mail or mail. We value your feedback.  [name], DO [place]

## 2020-12-11 NOTE — Telephone Encounter (Signed)
  Care Management   Follow Up Note   12/11/2020 Name: Lucas Caldwell MRN: [id number] DOB: [birth year]   Referred by: [name], DO Reason for referral : Chronic Care Management (RNCM: Follow up for Chronic Disease Management and Care Coordination Needs )   An unsuccessful telephone outreach was attempted today. The patient was referred to the case management team for assistance with care management and care coordination.   Follow Up Plan: A HIPPA compliant phone message was left for the patient providing contact information and requesting a return call.   Noreene Larsson RN, MSN, [place] Mobile: [phone]

## 2020-12-13 ENCOUNTER — Ambulatory Visit: Payer: Medicare HMO

## 2020-12-14 ENCOUNTER — Ambulatory Visit (INDEPENDENT_AMBULATORY_CARE_PROVIDER_SITE_OTHER): Payer: Medicare HMO

## 2020-12-14 ENCOUNTER — Telehealth: Payer: Medicare HMO | Admitting: General Practice

## 2020-12-14 DIAGNOSIS — I10 Essential (primary) hypertension: Secondary | ICD-10-CM

## 2020-12-14 DIAGNOSIS — M25511 Pain in right shoulder: Secondary | ICD-10-CM

## 2020-12-14 DIAGNOSIS — J432 Centrilobular emphysema: Secondary | ICD-10-CM

## 2020-12-14 DIAGNOSIS — M67911 Unspecified disorder of synovium and tendon, right shoulder: Secondary | ICD-10-CM

## 2020-12-14 DIAGNOSIS — E1121 Type 2 diabetes mellitus with diabetic nephropathy: Secondary | ICD-10-CM

## 2020-12-14 DIAGNOSIS — R0602 Shortness of breath: Secondary | ICD-10-CM

## 2020-12-14 DIAGNOSIS — I5032 Chronic diastolic (congestive) heart failure: Secondary | ICD-10-CM

## 2020-12-14 DIAGNOSIS — G8929 Other chronic pain: Secondary | ICD-10-CM

## 2020-12-14 NOTE — Chronic Care Management (AMB) (Signed)
Chronic Care Management   CCM RN Visit Note  12/14/2020 Name: Lucas Caldwell MRN: [id number] DOB: 10/31/1933  Subjective: Lucas Caldwell is a 87 y.o. year old male who is a primary care patient of [name], DO. The care management team was consulted for assistance with disease management and care coordination needs.    Engaged with patient by telephone for follow up visit in response to provider referral for case management and/or care coordination services. Spoke to the patients wife, Lucas Caldwell  Consent to Services:  The patient was given information about Chronic Care Management services, agreed to services, and gave verbal consent prior to initiation of services.  Please see initial visit note for detailed documentation.   Patient agreed to services and verbal consent obtained.   Assessment: Review of patient past medical history, allergies, medications, health status, including review of consultants reports, laboratory and other test data, was performed as part of comprehensive evaluation and provision of chronic care management services.   SDOH (Social Determinants of Health) assessments and interventions performed:    CCM Care Plan  Allergies  Allergen Reactions   Clopidogrel Anaphylaxis and Other (See Comments)    altered mental status;  (electrolytes "out of wack" and confusion per family; THEY DO NOT REMEMBER ANY OTHER REACTION)- MSB 10/10/15   Ciprofloxacin Itching   Mirtazapine Diarrhea    Bad dreams    Spironolactone Other (See Comments)    gynecomastia    Outpatient Encounter Medications as of 12/14/2020  Medication Sig   gabapentin (NEURONTIN) 300 MG capsule TAKE TWO CAPSULES BY MOUTH TWICE DAILY MAX 4 CAPSULE PER DAY - FOR KIDNEY PROTECTION (Patient taking differently: Take 600 mg by mouth 3 (three) times daily. Wife states the neurologist changed his dose to 600 mg TID recently. Confirmed in the MD notes as well.)   acetaminophen (TYLENOL)  500 MG tablet Take 1,000 mg by mouth every 6 (six) hours as needed for moderate pain or headache.    Alcohol Swabs (B-D SINGLE USE SWABS REGULAR) PADS Use to check blood sugar up to 2 times daily   allopurinol (ZYLOPRIM) 100 MG tablet Take 1 tablet (100 mg total) by mouth daily.   amoxicillin-clavulanate (AUGMENTIN) 875-125 MG tablet Take 1 tablet by mouth 2 (two) times daily. For 10 days   aspirin 81 MG chewable tablet Chew 81 mg by mouth daily.   Blood Glucose Monitoring Suppl (TRUE METRIX METER) DEVI Use to check blood sugar up to 2 times daily   bosutinib (BOSULIF) 100 MG tablet Take 100 mg by mouth at bedtime. Take with food.   Cholecalciferol 25 MCG (1000 UT) capsule Take 1,000 Units by mouth daily.   empagliflozin (JARDIANCE) 10 MG TABS tablet Take 1 tablet (10 mg total) by mouth daily with breakfast. Please make an appointment for further refills   ferrous sulfate 325 (65 FE) MG tablet Take by mouth.   Insulin Glargine ([name] KWIKPEN) 100 UNIT/ML Inject 18 Units into the skin daily.   Insulin Pen Needle 31G X 5 MM MISC 31 g by Does not apply route as directed.   isosorbide mononitrate (IMDUR) 60 MG 24 hr tablet Take 1 tablet (60 mg total) by mouth daily.   levothyroxine (SYNTHROID) 25 MCG tablet TAKE ONE TABLET BY MOUTH EVERY MORNING TAKE BEFORE BREAKFAST   losartan (COZAAR) 25 MG tablet Take 0.5 tablets (12.5 mg total) by mouth daily.   Menthol-Zinc Oxide (GOLD BOND EX) Apply 1 application topically daily as needed (muscle pain).  mirabegron ER (MYRBETRIQ) 25 MG TB24 tablet Take 1 tablet (25 mg total) by mouth daily. From Urology   Multiple Vitamin (MULTIVITAMIN WITH MINERALS) TABS tablet Take 1 tablet by mouth daily.   nitroGLYCERIN (NITROSTAT) 0.4 MG SL tablet Place 1 tablet (0.4 mg total) under the tongue every 5 (five) minutes x 3 doses as needed for chest pain.   omeprazole (PRILOSEC) 20 MG capsule Take 20 mg by mouth in the morning.    Polyethyl Glycol-Propyl Glycol (SYSTANE  OP) Place 1 drop into both eyes 3 (three) times daily as needed (dry/irritated eyes.).    polyethylene glycol (MIRALAX / GLYCOLAX) 17 g packet Take 17 g by mouth daily as needed for mild constipation.    potassium chloride (KLOR-CON) 10 MEQ tablet Take 1 tablet (10 mEq total) by mouth daily.   ranolazine (RANEXA) 500 MG 12 hr tablet TAKE ONE TABLET BY MOUTH TWICE DAILY   rosuvastatin (CRESTOR) 5 MG tablet TAKE ONE TABLET BY MOUTH ONCE DAILY   simethicone (MYLICON) 80 MG chewable tablet Chew 80 mg by mouth every 6 (six) hours as needed for flatulence.   tamsulosin (FLOMAX) 0.4 MG CAPS capsule Take 1 capsule (0.4 mg total) by mouth daily after supper.   torsemide (DEMADEX) 20 MG tablet Take 3 tablets (60 mg total) by mouth daily.   traMADol (ULTRAM) 50 MG tablet TAKE 1 OR 2 TABLETS BY MOUTH AT BEDTIME AS NEEDED   TRULICITY 1.5 OL/0.7EM SOPN Inject 1.5 mg into the skin once a week.   warfarin (COUMADIN) 3 MG tablet TAKE 1 TO 1 & 1/2 TABLETS BY MOUTH AS DIRECTED BY THE COUMADIN CLINIC   No facility-administered encounter medications on file as of 12/14/2020.    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 ([place]) 08/24/2018   CKD (chronic kidney disease), stage IV ([place]) 06/04/2018   Osteoarthritis of knees, bilateral 06/02/2018   Hyperlipidemia associated with type 2 diabetes mellitus ([place]) 05/06/2018   Pain of lower extremity 03/06/2018   Duodenitis 01/30/2018    Chronic heart failure with preserved ejection fraction (HFpEF) ([place]) 01/23/2018   Hematochezia    Melena    Rectal bleeding 01/11/2018   AVM (arteriovenous malformation) of colon without hemorrhage    Schatzki's ring    Esophageal dysphagia    Diabetic retinopathy ([place]) 07/08/2017   Ischemic cardiomyopathy 06/26/2017   Cardiomyopathy ([place]) 06/26/2017   Centrilobular emphysema ([place]) 06/13/2017   Fatigue 12/18/2016   Stable angina ([place]) 09/20/2016   Stage 3a chronic kidney disease ([place]) 09/20/2016   Paroxysmal atrial fibrillation ([place]) 08/28/2016   Chronic anticoagulation 08/28/2016   Essential hypertension 08/20/2016   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 heart with stable angina pectoris ([place]) 08/19/2016   Accelerating angina ([place]) 08/15/2016   Exertional dyspnea 08/12/2016   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[place]) 12/29/2015   Gynecomastia, male 03/31/2014   Hypertrophy of breast 03/31/2014   Nuclear sclerosis of left eye 10/22/2013   Enthesopathy of ankle and tarsus 04/10/2013   Controlled type 2 diabetes mellitus  with diabetic nephropathy, with long-term current use of insulin ([place]) 04/09/2013   Diabetic neuropathy associated with type 2 diabetes mellitus ([place]) 04/09/2013   Cataract of left eye 11/25/2011   Corneal opacity 09/30/2011   Pseudophakia of right eye 09/30/2011   GERD (gastroesophageal reflux disease) 09/25/2011   Gout 09/25/2011   Hyperlipidemia LDL goal <70 09/25/2011   Status post cataract extraction 04/11/2011   Cataract extraction status of eye 04/11/2011   Iron deficiency anemia 04/01/2011   Benign localized hyperplasia of prostate without urinary obstruction and other lower urinary tract symptoms (LUTS) 12/10/2010   Enlarged prostate without lower urinary tract symptoms (luts) 12/10/2010   Colon cancer screening 10/11/2010   Hypothyroidism 10/11/2010   Hyperlipidemia 09/20/2003   Essential and other specified forms of tremor 01/05/2001   Essential tremor  01/05/2001    Conditions to be addressed/monitored:CHF, HTN, COPD, DMII, and Chronic pain   Care Plan : RNCM: Heart Failure (Adult)  Updates made by Vanita Ingles, RN since 12/14/2020 12:00 AM     Problem: RNCM: Symptom Exacerbation (Heart Failure)   Priority: Medium     Long-Range Goal: RNCM: Symptom Exacerbation Prevented or Minimized   Start Date: 04/06/2020  Expected End Date: 09/27/2021  This Visit's Progress: On track  Recent Progress: On track  Priority: Medium  Note:   Current Barriers:  Knowledge deficits related to basic heart failure pathophysiology and self care management Unable to independently manage HF Unable to self administer medications as prescribed Lacks social connections Does not contact provider office for questions/concerns Lack of scale in home- has scales and uses a mat supplied by cardiology clinic- weighs daily and is monitored daily by the clinic  Financial strain  Nurse Case Manager Clinical Goal(s):   patient will weigh self daily and record patient will verbalize understanding of Heart Failure Action Plan and when to call doctor  patient will take all Heart Failure mediations as prescribed Interventions:  Collaboration with [place] [name], DO regarding development and update of comprehensive plan of care as evidenced by provider attestation and co-signature Inter-disciplinary care team collaboration (see longitudinal plan of care) Basic overview and discussion of pathophysiology of Heart Failure Provided written and verbal education on low sodium diet- 05-01-2020: The patients wife states he does not have a good appetite but he is adherent to heart healthy diet. 12-14-2020: The patient is adherent to a heart healthy/ADA diet. States he ate a good breakfast this am.  Reviewed Heart Failure Action Plan in depth and provided written copy. 12-14-2020: The patient continues to be monitored at home by using a device that he lays on daily and sends  to the heart failure clinic. The patient has not had any recent exacerbations. Denies changes in his HF. Is monitored closely through the HF clinic and the device he lays on daily to send in daily weights. The patients wife states the patient continues to use the device and has not had any out of range weights. Is followed closely.  Assessed for scales in home- has a mat he gets on daily to assess extra fluid Discussed importance of daily weight- 9-29--2022: The patient is compliant with daily weights. Reports no acute changes in weight.  Reviewed role of diuretics in prevention of fluid overload. 10-02-2020: The patient is compliant with medications. Recently was taken off of Metoprolol. 12-14-2020: The patient is compliant with medications. Denies any concerns with medications at this time.  Evaluation of upcoming appointments. 03-14-2021 at 2 pm with the pcp  Patient Goals/Self-Care Activities  patient will:  - Take Heart Failure Medications as prescribed - Weigh daily and record (notify MD with 3 lb weight gain over night or 5 lb in a week) weight today was 201 - Follow CHF Action Plan - Adhere to low sodium diet - barriers to lifestyle changes reviewed and addressed - barriers to treatment reviewed and addressed - depression screen reviewed - healthy lifestyle promoted - medication-adherence assessment completed - rescue (action) plan developed - rescue (action) plan reviewed - self-awareness of signs/symptoms of worsening disease encouraged Follow Up Plan: Telephone follow up appointment with care management team member scheduled for: 01-22-2021 at 37 am    Care Plan : RNCM: Hypertension (Adult)  Updates made by Vanita Ingles, RN since 12/14/2020 12:00 AM     Problem: RNCM: Hypertension (Hypertension)   Priority: Medium     Long-Range Goal: RNCM: Hypertension Monitored   Start Date: 04/06/2020  Expected End Date: 07/29/2021  This Visit's Progress: On track  Recent Progress: On  track  Priority: Medium  Note:   Objective:  Last practice recorded BP readings:  BP Readings from Last 3 Encounters:  12/11/20 (!) 148/75  11/23/20 (!) 132/56  11/08/20 108/66   Most recent eGFR/CrCl:  Lab Results  Component Value Date   EGFR 44 (L) 06/29/2020    No components found for: CRCL Current Barriers:  Knowledge Deficits related to basic understanding of hypertension pathophysiology and self care management Knowledge Deficits related to understanding of medications prescribed for management of hypertension Unable to independently HTN Case Manager Clinical Goal(s):  patient will verbalize understanding of plan for hypertension management patient will attend all scheduled medical appointments: 03-14-2021 at 2 pm with the pcp   patient will demonstrate improved adherence to prescribed treatment plan for hypertension as evidenced by taking all medications as prescribed, monitoring and recording blood pressure as directed, adhering to low sodium/DASH diet patient will demonstrate improved health management independence as evidenced by checking blood pressure as directed and notifying PCP if SBP>160 or DBP > 90, taking all medications as prescribe, and adhering to a low sodium diet as discussed. patient will verbalize basic understanding of hypertension disease process and self health management plan as evidenced by compliance with heart healthy/ADA diet, compliance with medications, and working with the CCM team to optimize health and well being.  Interventions:  Collaboration with [name], DO regarding development and update of comprehensive plan of care as evidenced by provider attestation and co-signature Inter-disciplinary care team collaboration (see longitudinal plan of care) Evaluation of current treatment plan related to hypertension self management and patient's adherence to plan as established by provider. 10-02-2020: The patients wife states the patients  blood pressures have been up and down. The cardiologist took the patient off of his metoprolol and will be evaluated on 10-04-2020. The patient complains of dizziness at times. Reviewed with the patients wife being safe and changing position slowly. Will continue to monitor for changes and new concerns. 12-14-2020: The patient with elevated blood pressures, may be due to pain he is experiencing in his bilateral shoulders. The patients wife helps the patient manage his chronic conditions. Is aware what worse looks like and to call for changes.  Provided education to patient re: stroke prevention, s/s of heart attack and stroke, DASH diet, complications of uncontrolled blood pressure Reviewed medications with patient and discussed importance of compliance. 12-14-2020: The patient is compliant with medications.  Discussed plans with patient for ongoing care management follow up  and provided patient with direct contact information for care management team Advised patient, providing education and rationale, to monitor blood pressure daily and record, calling PCP for findings outside established parameters.  Reviewed scheduled/upcoming provider appointments including: 03-14-2021 at 2 pm Self-Care Activities: - Self administers medications as prescribed Attends all scheduled provider appointments Calls provider office for new concerns, questions, or BP outside discussed parameters Checks BP and records as discussed Follows a low sodium diet/DASH diet Patient Goals: - check blood pressure 3 times per week - choose a place to take my blood pressure (home, clinic or office, retail store) - write blood pressure results in a log or diary - agree on reward when goals are met - agree to work together to make changes - ask questions to understand - have a family meeting to talk about healthy habits - learn about high blood pressure  Follow Up Plan: Telephone follow up appointment with care management team member  scheduled for: 01-22-2021 at 26 am    Care Plan : RNCM: Diabetes Type 2 (Adult)  Updates made by Vanita Ingles, RN since 12/14/2020 12:00 AM     Problem: RNCM: Glycemic Management (Diabetes, Type 2)   Priority: Medium     Long-Range Goal: RNCM: Glycemic Management Optimized   Start Date: 04/06/2020  Expected End Date: 09/27/2021  This Visit's Progress: On track  Recent Progress: On track  Priority: Medium  Note:   Objective:  Lab Results  Component Value Date   HGBA1C 6.2 (A) 10/16/2020     Lab Results  Component Value Date   CREATININE 1.73 (H) 08/16/2020   BUN 23 08/16/2020   NA 135 08/16/2020   K 4.7 08/16/2020   CL 100 08/16/2020   CO2 24 08/16/2020    No results found for: EGFR Current Barriers:  Knowledge Deficits related to basic Diabetes pathophysiology and self care/management Knowledge Deficits related to medications used for management of diabetes Difficulty obtaining or cannot afford medications Limited Social Support Unable to self administer medications as prescribed Lacks social connections Does not contact provider office for questions/concerns Case Manager Clinical Goal(s):  Collaboration with [name], DO regarding development and update of comprehensive plan of care as evidenced by provider attestation and co-signature Inter-disciplinary care team collaboration (see longitudinal plan of care) patient will demonstrate improved adherence to prescribed treatment plan for diabetes self care/management as evidenced by:  daily monitoring and recording of CBG- 06-26-2020: Per the patients wife his blood sugars are 120-156 and staying under 200. 10-02-2020: States his blood sugar this am was 114. Will have new blood work soon. Education and support given.  adherence to ADA/ carb modified diet adherence to prescribed medication regimen Interventions:  Provided education to patient about basic DM disease process Reviewed medications with patient  and discussed importance of medication adherence. 12-14-2020: The patient is compliant with the current medications regimen. Working with pharm D on a consistent basis.  Discussed plans with patient for ongoing care management follow up and provided patient with direct contact information for care management team Provided patient with written educational materials related to hypo and hyperglycemia and importance of correct treatment. 12-14-2020: Denies any lows at this time. States he is eating well.  Reviewed scheduled/upcoming provider appointments including:  03-14-2021 at 2 pm with the pcp  Advised patient, providing education and rationale, to check cbg bid  and record, calling pcp for findings outside established parameters.  12-14-2020: The patient is compliant with blood sugar checks. Praised for lower  A1C. His wife helps manage his care. The wife states his blood sugar this am was 142. Will continue to monitor.  Review of patient status, including review of consultants reports, relevant laboratory and other test results, and medications completed. Evaluation of diabetic shoes. The patient has his diabetic shoes and they are working well for him. He has an appointment today to see the podiatrist about his big toe on his right foot. [name] being followed closely. Education and support given. Will continue to monitor.  Patient Goals/Self-Care Activities  patient will:  - Self administers oral medications as prescribed Self administers insulin as prescribed Attends all scheduled provider appointments Checks blood sugars as prescribed and utilize hyper and hypoglycemia protocol as needed Adheres to prescribed ADA/carb modified - barriers to adherence to treatment plan identified - blood glucose monitoring encouraged - blood glucose readings reviewed - individualized medical nutrition therapy provided - resources required to improve adherence to care identified - self-awareness of signs/symptoms of  hypo or hyperglycemia encouraged - use of blood glucose monitoring log promoted Follow Up Plan: Telephone follow up appointment with care management team member scheduled for: 01-22-2021 at 72 am     Care Plan : RNCM: COPD (Adult)  Updates made by Vanita Ingles, RN since 12/14/2020 12:00 AM     Problem: RNCM: Disease Progression (COPD)   Priority: High     Long-Range Goal: RNCM: Disease Progression Minimized or Managed   Start Date: 04/06/2020  Expected End Date: 09/27/2021  This Visit's Progress: On track  Recent Progress: On track  Priority: High  Note:   Current Barriers:  Knowledge deficits related to basic understanding of COPD disease process Knowledge deficits related to basic COPD self care/management Knowledge deficit related to basic understanding of how to use inhalers and how inhaled medications work Knowledge deficit related to importance of energy conservation Unable to independently manage COPD as evidence of continued shortness of breath in patient with multiple chronic conditions.  Does not contact provider office for questions/concerns  Case Manager Clinical Goal(s): patient will report using inhalers as prescribed including rinsing mouth after use patient will report utilizing pursed lip breathing for shortness of breath patient will verbalize understanding of COPD action plan and when to seek appropriate levels of medical care patient will engage in lite exercise as tolerated to build/regain stamina and strength and reduce shortness of breath through activity tolerance patient will verbalize basic understanding of COPD disease process and self care activities patient will not be hospitalized for COPD exacerbation as evidenced  Interventions:  Collaboration with [name], DO regarding development and update of comprehensive plan of care as evidenced by provider attestation and co-signature Inter-disciplinary care team collaboration (see  longitudinal plan of care) Provided patient with basic written and verbal COPD education on self care/management/and exacerbation prevention. Per the patients wife he has OSA also and needs a CPAP machine. She called about it on Friday. States the order has been there since May. Advised the patients wife to call weekly for updates. Will continue to monitor. 12-14-2020: The patient has needed supplies. Denies any new concerns with CPAP machine equipment at this time.  Provided patient with COPD action plan and reinforced importance of daily self assessment. May have some additional shortness of breath due to his heart failure. Is being monitored closely. 12-14-2020: The patients wife states his COPD is stable at this time. Denies any acute findings. States if he does certain activities he gets short of breath.  Discussed Pulmonary  Rehab and offered to assist with referral placement Provided written and verbal instructions on pursed lip breathing and utilized returned demonstration as teach back Provided instruction about proper use of medications used for management of COPD including inhalers Advised patient to self assesses COPD action plan zone and make appointment with provider if in the yellow zone for 48 hours without improvement. 12-14-2020: The patient and his wife do well with calling office for changes in condition. The patient is stable at this time. Provided patient with education about the role of exercise in the management of COPD Advised patient to engage in light exercise as tolerated 3-5 days a week Provided education about and advised patient to utilize infection prevention strategies to reduce risk of respiratory infection. 12-14-2020: The patient is stable at this time. Knows what worse looks like. Will continue to monitor for changes.  Self-Care Activities:  Patient verbalizes understanding of plan to effective management of COPD Self administers medications as prescribed Attends all  scheduled provider appointments Calls pharmacy for medication refills Attends church or other social activities Performs ADL's independently Performs IADL's independently Calls provider office for new concerns or questions Patient Goals: - do breathing exercises at least 2 times each day - do exercises in a comfortable position that makes breathing as easy as possible - develop a new routine to improve sleep - don't eat or exercise right before bedtime - eat healthy - get at least 7 to 8 hours of sleep at night - get outdoors every day (weather permitting) - keep room cool and dark - limit daytime naps - practice relaxation or meditation daily - use a fan or white noise in bedroom - use devices that will help like a cane, sock-puller or reacher - develop a rescue plan - eliminate symptom triggers at home - follow rescue plan if symptoms flare-up - keep follow-up appointments - use an extra pillow to sleep - avoid second hand smoke - eliminate smoking in my home - identify and avoid work-related triggers - identify and remove indoor air pollutants - limit outdoor activity during cold weather - listen for public air quality announcements every day Follow Up Plan: Telephone follow up appointment with care management team member scheduled for: 01-22-2021 at 1030 am    Care Plan : Chronic Pain (Adult)  Updates made by Vanita Ingles, RN since 12/14/2020 12:00 AM     Problem: Chronic Pain Management (Chronic Pain)      Long-Range Goal: Chronic Pain Managed   Start Date: 12/14/2020  Expected End Date: 12/14/2021  This Visit's Progress: On track  Priority: High  Note:   Current Barriers:  Knowledge Deficits related to managing acute/chronic pain Non-adherence to scheduled provider appointments Non-adherence to prescribed medication regimen Difficulty obtaining medications Chronic Disease Management support and education needs related to chronic pain Unable to independently  manage chronic pain in bilateral shoulders Nurse Case Manager Clinical Goal(s):  patient will verbalize understanding of plan for managing pain patient will work with [place] to address pain relief measures and effective management of pain  patient will attend all scheduled medical appointments: 03-14-2021 at 2 pm patient will demonstrate use of different relaxation  skills and/or diversional activities to assist with pain reduction (distraction, imagery, relaxation, massage, acupressure, TENS, heat, and cold application patient will report pain at a level less than 3 to 4 on a 10-10 rating scale patient will use pharmacological and nonpharmacological pain relief strategies patient will verbalize acceptable level of pain relief and ability  to engage in desired activities patient will engage in desired activities without an increase in pain level Interventions:  Collaboration with [place] Ranger, [name], DO regarding development and update of comprehensive plan of care as evidenced by provider attestation and co-signature Inter-disciplinary care team collaboration (see longitudinal plan of care) - careful application of heat or ice encouraged - deep breathing, relaxation and mindfulness use promoted - effectiveness of pharmacologic therapy monitored - medication-induced side effects managed - misuse of pain medication assessed - motivation and barriers to change assessed and addressed - mutually acceptable comfort goal set - pain assessed - pain treatment goals reviewed - premedication prior to activity encouraged Evaluation of current treatment plan related to pain in bilateral shoulders and patient's adherence to plan as established by provider. 12-14-2020: The patient had a cortisone shot in his right shoulder on 12-11-2020. The patients wife states the patient still can not raise his arm and states it is still hurting. The patients wife states the pcp may have to refer the patient  to an orthopedic provider. The wife states the neurologist changed the gabapentin to 600 mg TID and this has been very helpful for the patient. Advised the patients wife to call the office on Monday if the patient had not had an improvement in his pain level.  Advised patient to call the office for changes in level or intensity of pain in his bilateral shoulders  Provided education to patient re: use of heat application. The patients wife states this has not been effective for him.  Reviewed medications with patient and discussed 12-14-2020: The patients wife endorses compliance with medications.  Discussed plans with patient for ongoing care management follow up and provided patient with direct contact information for care management team Allow patient to maintain a diary of pain ratings, timing, precipitating events, medications, treatments, and what works best to relieve pain,  Refer to support groups and self-help groups Educate patient about the use of pharmacological interventions for pain management- antianxiety, antidepressants, NSAIDS, opioid analgesics,  Explain the importance of lifestyle modifications to effective pain management  Patient Goals/Self Care Activities:   Self-administers medications as prescribed Attends all scheduled provider appointments Calls pharmacy for medication refills Calls provider office for new concerns or questions Follow Up Plan: Telephone follow up appointment with care management team member scheduled for: 01-22-2021 at 1030 am        Plan:Telephone follow up appointment with care management team member scheduled for:  01-22-2021 at 3 am  Noreene Larsson RN, MSN, [place] Mobile: [phone]

## 2020-12-14 NOTE — Patient Instructions (Signed)
Visit Information  PATIENT GOALS:  Goals Addressed             This Visit's Progress    RNCM: Manage Chronic Pain       Timeframe:  Long-Range Goal Priority:  High Start Date:    12-14-2020                         Expected End Date:    12-14-2021                   Follow Up Date 01/22/2021    - call for medicine refill 2 or 3 days before it runs out - develop a personal pain management plan - keep track of prescription refills - plan exercise or activity when pain is best controlled - prioritize tasks for the day - track times pain is worst and when it is best - track what makes the pain worse and what makes it better - use ice or heat for pain relief - work slower and less intense when having pain    Why is this important?   Day-to-day life can be hard when you have chronic pain.  Pain medicine is just one piece of the treatment puzzle.  You can try these action steps to help you manage your pain.    Notes: 12-14-2020: The patient had an injection of cortisone on Monday at pcp office. The patients wife states today that he cannot see a lot of improvement yet in his pain level. The wife states that Dr. Raliegh Ip states that he may have to refer the patient to an orthopedic doctor. The wife states the neurologist did increase his Gabapentin to 600 mg TID and this has been helpful. Denies any acute distress. Encouraged the patients wife to call the office on Monday if no improvement in his pain level in his right shoulder. The wife verbalized understanding.      RNCM: Manage Fatigue (Tiredness-COPD)       Follow Up Date 01/22/2021    - develop a new routine to improve sleep - don't eat or exercise right before bedtime - eat healthy - get at least 7 to 8 hours of sleep at night - get outdoors every day (weather permitting) - keep room cool and dark - limit daytime naps - practice relaxation or meditation daily - use a fan or white noise in bedroom - use devices that will help like a  cane, sock-puller or reacher    Why is this important?   Feeling tired or worn out is a common symptom of COPD (chronic obstructive pulmonary disease).  Learning when you feel your best and when you need rest is important.  Managing the tiredness (fatigue) will help you be active and enjoy life.     Notes: Per the patients wife he dreams a lot at night and this causes him not to sleep well. Also it has been determined that he has sleep apnea and would benefit from CPAP. The wife is checking into this for the patient. The patient has to pace activities and says he easily gets short of breath just by putting on his pants. Education on taking rest breaks and pacing activity. 12-14-2020: The patient is doing well. The wife states he still gets short of breath with some activity but is not having any acute distress. The patient had eaten breakfast and was gone out in his truck this morning at the time of the call.  RNCM: Track and Manage My Triggers-COPD       Timeframe:  Long-Range Goal Priority:  High Start Date:    10-02-2020                         Expected End Date:      10-02-2021                 Follow Up Date 01/22/2021    - avoid second hand smoke - eliminate smoking in my home - identify and avoid work-related triggers - identify and remove indoor air pollutants - limit outdoor activity during cold weather - listen for public air quality announcements every day    Why is this important?   Triggers are activities or things, like tobacco smoke or cold weather, that make your COPD (chronic obstructive pulmonary disease) flare-up.  Knowing these triggers helps you plan how to stay away from them.  When you cannot remove them, you can learn how to manage them.     Notes: 10-02-2020: The patient is a former smoker. Has complex medical conditions that impact his breathing. Easily gets short of breath. Denies any acute distress. Had a sleep apnea test and needs a cpap. Has been ordered since  May and the wife states she called Friday and they still don't have it. Reminded the patient to call every week for updates on device. 12-14-2020: The patients wife states the patient is stable at this time. Denies any acute distress. Has seen specialist for multiple chronic conditions. Saw pcp this week. Will continue to monitor.         The patient verbalized understanding of instructions, educational materials, and care plan provided today and declined offer to receive copy of patient instructions, educational materials, and care plan.   Telephone follow up appointment with care management team member scheduled for: 01-22-2021 at 71 am  Noreene Larsson RN, MSN, [place] Mobile: [phone]

## 2020-12-15 ENCOUNTER — Ambulatory Visit: Payer: Medicare HMO

## 2020-12-15 DIAGNOSIS — L97512 Non-pressure chronic ulcer of other part of right foot with fat layer exposed: Secondary | ICD-10-CM | POA: Diagnosis not present

## 2020-12-15 DIAGNOSIS — M792 Neuralgia and neuritis, unspecified: Secondary | ICD-10-CM | POA: Diagnosis not present

## 2020-12-15 DIAGNOSIS — L84 Corns and callosities: Secondary | ICD-10-CM | POA: Diagnosis not present

## 2020-12-15 DIAGNOSIS — J432 Centrilobular emphysema: Secondary | ICD-10-CM

## 2020-12-15 DIAGNOSIS — I5032 Chronic diastolic (congestive) heart failure: Secondary | ICD-10-CM | POA: Diagnosis not present

## 2020-12-15 DIAGNOSIS — Z89411 Acquired absence of right great toe: Secondary | ICD-10-CM | POA: Diagnosis not present

## 2020-12-15 DIAGNOSIS — M2041 Other hammer toe(s) (acquired), right foot: Secondary | ICD-10-CM | POA: Diagnosis not present

## 2020-12-15 DIAGNOSIS — L603 Nail dystrophy: Secondary | ICD-10-CM | POA: Diagnosis not present

## 2020-12-15 DIAGNOSIS — I10 Essential (primary) hypertension: Secondary | ICD-10-CM

## 2020-12-15 DIAGNOSIS — Z794 Long term (current) use of insulin: Secondary | ICD-10-CM | POA: Diagnosis not present

## 2020-12-15 DIAGNOSIS — M216X1 Other acquired deformities of right foot: Secondary | ICD-10-CM | POA: Diagnosis not present

## 2020-12-15 DIAGNOSIS — B351 Tinea unguium: Secondary | ICD-10-CM | POA: Diagnosis not present

## 2020-12-15 DIAGNOSIS — E1121 Type 2 diabetes mellitus with diabetic nephropathy: Secondary | ICD-10-CM | POA: Diagnosis not present

## 2020-12-15 DIAGNOSIS — E1142 Type 2 diabetes mellitus with diabetic polyneuropathy: Secondary | ICD-10-CM | POA: Diagnosis not present

## 2020-12-18 ENCOUNTER — Telehealth: Payer: Self-pay | Admitting: Pharmacist

## 2020-12-18 ENCOUNTER — Telehealth: Payer: Medicare HMO

## 2020-12-18 NOTE — Telephone Encounter (Signed)
  Chronic Care Management   Outreach Note  12/18/2020 Name: NEFI MUSICH MRN: [id number] DOB: October 22, 1933  Referred by: [name], DO Reason for referral : No chief complaint on file.   Was unable to reach patient via telephone today and have left HIPAA compliant voicemail asking patient to return my call.    Follow Up Plan: CM Pharmacist will attempt to reach patient by phone within the next 14 days  [name], PharmD, [facility] [phone]

## 2020-12-26 ENCOUNTER — Telehealth: Payer: Self-pay

## 2020-12-26 DIAGNOSIS — M25512 Pain in left shoulder: Secondary | ICD-10-CM

## 2020-12-26 DIAGNOSIS — G8929 Other chronic pain: Secondary | ICD-10-CM

## 2020-12-26 DIAGNOSIS — M67911 Unspecified disorder of synovium and tendon, right shoulder: Secondary | ICD-10-CM

## 2020-12-26 NOTE — Telephone Encounter (Signed)
Copied from [place] [phone] [phone]. Topic: General - Other >> Dec 26, 2020  2:49 PM Loma Boston wrote: Reason for CRM: Pt states that Dr Raliegh Ip had given a shot of Cortizone to pt  for shoulder and was supposed to let him know if there was not any improvement. [name], pt wife says that it did not make much of a difference at all and they will wait for Dr K to advise further. States to call her, [name] back at [phone].

## 2020-12-27 NOTE — Telephone Encounter (Signed)
Please let patient and wife know that Referral sent to [facility] as next plan of treatment. There is not much else I can offer for the shoulder pain at this time. They will call and schedule  [name], DO [facility] 12/27/2020, 12:23 PM

## 2021-01-01 ENCOUNTER — Other Ambulatory Visit: Payer: Self-pay

## 2021-01-01 DIAGNOSIS — E1121 Type 2 diabetes mellitus with diabetic nephropathy: Secondary | ICD-10-CM

## 2021-01-01 MED ORDER — BD SWAB SINGLE USE REGULAR PADS: MEDICATED_PAD | 3 refills | Status: DC

## 2021-01-03 ENCOUNTER — Ambulatory Visit (INDEPENDENT_AMBULATORY_CARE_PROVIDER_SITE_OTHER): Payer: Medicare HMO

## 2021-01-03 DIAGNOSIS — I480 Paroxysmal atrial fibrillation: Secondary | ICD-10-CM | POA: Diagnosis not present

## 2021-01-03 DIAGNOSIS — E1142 Type 2 diabetes mellitus with diabetic polyneuropathy: Secondary | ICD-10-CM | POA: Diagnosis not present

## 2021-01-03 DIAGNOSIS — Z7901 Long term (current) use of anticoagulants: Secondary | ICD-10-CM

## 2021-01-03 DIAGNOSIS — L851 Acquired keratosis [keratoderma] palmaris et plantaris: Secondary | ICD-10-CM | POA: Diagnosis not present

## 2021-01-03 LAB — POCT INR: INR: 1.5 — AB (ref 2.0–3.0)

## 2021-01-03 NOTE — Patient Instructions (Signed)
-   take 2 tablets warfarin tonight and tomorrow, then  - continue dosage of warfarin of 1.5 TABLETS EVERY DAY EXCEPT 1 TABLETS ON MONDAYS, [place]. - recheck INR in 4 weeks.

## 2021-01-04 ENCOUNTER — Inpatient Hospital Stay (HOSPITAL_COMMUNITY)
Admission: RE | Admit: 2021-01-04 | Discharge: 2021-01-04 | Disposition: A | Discharge status: Home / Self Care | Payer: Medicare HMO | Source: Ambulatory Visit | Attending: Internal Medicine | Admitting: Internal Medicine

## 2021-01-04 ENCOUNTER — Telehealth (HOSPITAL_COMMUNITY): Payer: Self-pay | Admitting: Adult Health

## 2021-01-04 DIAGNOSIS — I5022 Chronic systolic (congestive) heart failure: Secondary | ICD-10-CM

## 2021-01-04 NOTE — Telephone Encounter (Signed)
[phone] Jerilynn Mages) LMOM

## 2021-01-04 NOTE — Progress Notes (Signed)
ADVANCED HF CLINIC PROGRESS NOTE   --Patient did not show for appt. Note left for templating purposes only. --   Primary Cardiologist: Dr. Saunders Revel AHF: [name]   Reasons for Visit: F/u for chronic systolic heart failure    HPI: Lucas Caldwell is a 87 y.o. 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 Saunders Revel for further evaluation of his HF.    Has h/o NICM previously followed by Dr. Carolynn Serve at [facility]. He had good response to medical therapy in 2012 EF was reported to be 50%   Had PCI of his RCA CAD s/p RCA stent x2 08/2016 at [facility].    Switched his care to Dr. Saunders Revel in 2019. Recently struggling with increasing DOE.   Echo 08/2018 LVEF 35-40%, indeterminite LV diastolic filling pressures, tricuspid AV, mildly dilated pulmonary artery, no significant valvular disfunction.   Cardiac cath 08/2018 70% LM stenosis with abnl FFR of 0.86, though drop in iFR was due to diffuse prox LAD disease and only small gradient involving ostial LM. Imdur was increased.   Admitted 04/13/19 with chest pain and A/C systolic heart failure. Repeat Cath 1/21 LM 60% LAD 70% with +FFR otherwise diffuse non-obstructive CAD -> PCI/DES LAD 1/21. RHC: RA 10 RV 53/12 PA 53/30 (40) PCWP 28 CO/CI 4.2/2.1 PA sat 67%.  Echo 04/22/19 EF 30-35%  Referred to [facility] 04/2019 for further management of his systolic HF. He has been on goal directed medical therapy, including metoprolol succinate (dose limited by bifascicular block), hydralazine, and isosorbide mononitrate.  He has had problems with renal insufficiency in the past, limiting ACEI/ARB use in the past, though decision was recently made to  rechallenge him given worsening HF and declining LVEF. At f/u visit w/ Dr. Saunders Revel 3/21 Losartan 25 mg was added. He has done well with losartan. No issues.   Zio patch 06/2019 showed SR w/ 1st degree AVB, avg HR 81 bpm, 1 brief 5 beat run of NSVT, Two runs of SVT - the longest lasting 5 beats with an avg rate of 104 bpm. Rare PACs and PVCs noted.   At his most recent [facility] f/u 5/20, his volume status was felt to be low, ReDs clip was 31%. Torsemide was reduced to 60 mg daily. He now has CardioMEMS implant, placed by [name] on 7/8.   [name] here for routine f/u. Feeling much better. Says he does get SOB sometimes when he sits back in the chair. Still gets SOB walking to mailbox. No edema. Weight stable.  No CP. Taking all meds without problem. Cardiomems reviewed with him almost all PADs below goal of 15    Past Medical History:  Diagnosis Date   Arthritis    "probably in his legs" (08/15/2016)   Blind right eye    BPH (benign prostatic hyperplasia)    Breast asymmetry    Left breast is larger, present for several years.   CAD (coronary artery disease)    a. Cath in the late 90's - reportedly ok;  b. 2014 s/p stenting x 2 @ [facility]; c 08/19/16 Cath/PCI with DES -> RCA, plan to treat LM 70% medically. Seen by surgery and felt to be too high risk for CABG; d. 08/2018 Cath: LM  70 (iFR 0.86), LAD 20ost, 40p, 50/44m, D1 70, LCX patent stent, OM1 30, RCA patent stent, 65m ISR, 40d, RPDA 30. PCWP 8. CO/CI 4.0/2.1; e. 03/2019 PCI to LAD (2.5x15 Resolute Onyx DES).   Chronic combined systolic (congestive) and diastolic (congestive) heart failure ([place])    a. Previously reduced EF-->50% by echo in 2012;  b. 06/2015 Echo: EF 50-55%  c. 07/2016 Echo: EF 45-50%; d. 12/2017 Echo: EF 55%; e. 08/2018 Echo: EF 35-40%; f. 04/2019 Echo: EF 30-35%; g. s/p cardiomems; h. 10/2020 Echo: EF 30-35%, mod-sev glob HK, gr1 DD.   Chronic kidney disease (CKD), stage IV (severe) (HCC)    CML (chronic myelocytic leukemia) (HCC)    GERD  (gastroesophageal reflux disease)    GIB (gastrointestinal bleeding)    a. 12/2017 3 unit PRBC GIB in setting of coumadin-->Endo/colon multiple duodenal ulcers and a single bleeding ulcer in the proximal ascending colon status post hemostatic clipping x2.   Gout    Hyperlipemia    Hypertension    Hypothyroidism    Iron deficiency anemia    Ischemic cardiomyopathy    a. Previously reduced EF-->50% by echo in 2012;  b. 06/2015 Echo: EF 50-55%, Gr2 DD;  c. 07/2016 Echo: EF 45-50%; d. 12/2017 Echo: EF 55%, Gr1 DD, mild MR; e. 08/2018 Echo: EF 35-40%; f. 04/2019 Echo: EF 30-35%; g. 10/2020 Echo: EF 30-35%.   Migraine    "in the 1960s" (08/15/2016)   Obstructive sleep apnea    Orthostatic hypotension    a. 20222 -->carvedilol and hydralazine d/c'd.   PAF (paroxysmal atrial fibrillation) (HCC)    a. ? Dx 2014-->s/p DCCV;  b. CHA2DS2VASc = 6--> Coumadin.   Prostate cancer (HCC)    RBBB    Type II diabetes mellitus (HCC)     Current Outpatient Medications  Medication Sig Dispense Refill   acetaminophen (TYLENOL) 500 MG tablet Take 1,000 mg by mouth every 6 (six) hours as needed for moderate pain or headache.      Alcohol Swabs (B-D SINGLE USE SWABS REGULAR) PADS Use to check blood sugar up to 2 times daily 200 each 3   allopurinol (ZYLOPRIM) 100 MG tablet Take 1 tablet (100 mg total) by mouth daily. 90 tablet 0   amoxicillin-clavulanate (AUGMENTIN) 875-125 MG tablet Take 1 tablet by mouth 2 (two) times daily. For 10 days 20 tablet 0   aspirin 81 MG chewable tablet Chew 81 mg by mouth daily.     Blood Glucose Monitoring Suppl (TRUE METRIX METER) DEVI Use to check blood sugar up to 2 times daily 1 each 0   bosutinib (BOSULIF) 100 MG tablet Take 100 mg by mouth at bedtime. Take with food.     Cholecalciferol 25 MCG (1000 UT) capsule Take 1,000 Units by mouth daily.     empagliflozin (JARDIANCE) 10 MG TABS tablet Take 1 tablet (10 mg total) by mouth daily with breakfast. Please make an appointment for  further refills 30 tablet 0   ferrous sulfate 325 (65 FE) MG tablet Take by mouth.     gabapentin (NEURONTIN) 300 MG capsule TAKE TWO CAPSULES BY MOUTH TWICE DAILY MAX 4 CAPSULE PER DAY - FOR KIDNEY PROTECTION (Patient taking differently: Take 600 mg by mouth 3 (three) times daily. Wife states the neurologist changed his dose to 600 mg TID recently. Confirmed in the Lucas Caldwell notes as well.) 120 capsule 5   Insulin Glargine ([name] KWIKPEN) 100 UNIT/ML Inject 18 Units into the skin daily.     Insulin Pen  Needle 31G X 5 MM MISC 31 g by Does not apply route as directed. 100 each 5   isosorbide mononitrate (IMDUR) 60 MG 24 hr tablet Take 1 tablet (60 mg total) by mouth daily. 135 tablet 2   levothyroxine (SYNTHROID) 25 MCG tablet TAKE ONE TABLET BY MOUTH EVERY MORNING TAKE BEFORE BREAKFAST 90 tablet 0   losartan (COZAAR) 25 MG tablet Take 0.5 tablets (12.5 mg total) by mouth daily. 90 tablet 3   Menthol-Zinc Oxide (GOLD BOND EX) Apply 1 application topically daily as needed (muscle pain).     mirabegron ER (MYRBETRIQ) 25 MG TB24 tablet Take 1 tablet (25 mg total) by mouth daily. From Urology 30 tablet 11   Multiple Vitamin (MULTIVITAMIN WITH MINERALS) TABS tablet Take 1 tablet by mouth daily.     nitroGLYCERIN (NITROSTAT) 0.4 MG SL tablet Place 1 tablet (0.4 mg total) under the tongue every 5 (five) minutes x 3 doses as needed for chest pain. 25 tablet 4   omeprazole (PRILOSEC) 20 MG capsule Take 20 mg by mouth in the morning.      Polyethyl Glycol-Propyl Glycol (SYSTANE OP) Place 1 drop into both eyes 3 (three) times daily as needed (dry/irritated eyes.).      polyethylene glycol (MIRALAX / GLYCOLAX) 17 g packet Take 17 g by mouth daily as needed for mild constipation.      potassium chloride (KLOR-CON) 10 MEQ tablet Take 1 tablet (10 mEq total) by mouth daily. 90 tablet 1   ranolazine (RANEXA) 500 MG 12 hr tablet TAKE ONE TABLET BY MOUTH TWICE DAILY 180 tablet 2   rosuvastatin (CRESTOR) 5 MG tablet TAKE  ONE TABLET BY MOUTH ONCE DAILY 90 tablet 0   simethicone (MYLICON) 80 MG chewable tablet Chew 80 mg by mouth every 6 (six) hours as needed for flatulence.     tamsulosin (FLOMAX) 0.4 MG CAPS capsule Take 1 capsule (0.4 mg total) by mouth daily after supper. 30 capsule 11   torsemide (DEMADEX) 20 MG tablet Take 3 tablets (60 mg total) by mouth daily. 90 tablet 6   traMADol (ULTRAM) 50 MG tablet TAKE 1 OR 2 TABLETS BY MOUTH AT BEDTIME AS NEEDED 30 tablet 2   TRULICITY 1.5 QI/2.9NL SOPN Inject 1.5 mg into the skin once a week. 6 mL 3   warfarin (COUMADIN) 3 MG tablet TAKE 1 TO 1 & 1/2 TABLETS BY MOUTH AS DIRECTED BY THE COUMADIN CLINIC 120 tablet 0   No current facility-administered medications for this encounter.    Allergies  Allergen Reactions   Clopidogrel Anaphylaxis and Other (See Comments)    altered mental status;  (electrolytes "out of wack" and confusion per family; THEY DO NOT REMEMBER ANY OTHER REACTION)- MSB 10/10/15   Ciprofloxacin Itching   Mirtazapine Diarrhea    Bad dreams    Spironolactone Other (See Comments)    gynecomastia      Social History   Socioeconomic History   Marital status: Married    Spouse name: Not on file   Number of children: Not on file   Years of education: Not on file   Highest education level: 7th grade  Occupational History   Occupation: Retired    Comment: Owned his own [employer].  Tobacco Use   Smoking status: Former    Packs/day: 1.75    Years: 20.00    Pack years: 35.00    Types: Cigarettes    Start date: 65    Quit date: 1975    Years  since quitting: 47.8   Smokeless tobacco: Former  Scientific laboratory technician Use: Never used  Substance and Sexual Activity   Alcohol use: No    Comment: previously drank heavily - quit 1979.   Drug use: No   Sexual activity: Never  Other Topics Concern   Not on file  Social History Narrative   Working on transportation truck part time    Social Determinants of Systems developer Strain: Low Risk    Difficulty of Paying Living Expenses: Not hard at all  Food Insecurity: No Food Insecurity   Worried About Charity fundraiser in the Last Year: Never true   Arboriculturist in the Last Year: Never true  Transportation Needs: No Transportation Needs   Lack of Transportation (Medical): No   Lack of Transportation (Non-Medical): No  Physical Activity: Inactive   Days of Exercise per Week: 0 days   Minutes of Exercise per Session: 0 min  Stress: No Stress Concern Present   Feeling of Stress : Not at all  Social Connections: Moderately Integrated   Frequency of Communication with Friends and Family: More than three times a week   Frequency of Social Gatherings with Friends and Family: More than three times a week   Attends Religious Services: More than 4 times per year   Active Member of [employer] or Organizations: No   Attends Archivist Meetings: Never   Marital Status: Married  Human resources officer Violence: Not At Risk   Fear of Current or Ex-Partner: No   Emotionally Abused: No   Physically Abused: No   Sexually Abused: No      Family History  Problem Relation Age of Onset   Brain cancer Father    Diabetes Sister    Heart attack Sister     There were no vitals filed for this visit.  Lucas Readings from Last 3 Encounters:  12/11/20 88.1 kg (194 lb 3.2 oz)  11/23/20 87.3 kg (192 lb 6.4 oz)  11/08/20 88.9 kg (196 lb)   PHYSICAL EXAM: General:  Elderly No resp difficulty HEENT: normal Neck: supple. no JVD. Carotids 2+ bilat; no bruits. No lymphadenopathy or thryomegaly appreciated. Cor: PMI nondisplaced. Regular rate & rhythm. No rubs, gallops or murmurs. Lungs: clear Abdomen: obese soft, nontender, nondistended. No hepatosplenomegaly. No bruits or masses. Good bowel sounds. Extremities: no cyanosis, clubbing, rash, edema Neuro: alert & orientedx3, cranial nerves grossly intact. moves all 4 extremities w/o difficulty. Affect pleasant +  diffuse tremor  ASSESSMENT & PLAN:  1. Chronic systolic HF: - likely combination of iCM/NICM - EF 30-35% echo 2/21 (was 35-40% in 6/20) - NYHA III, chronic, confounded by obesity, advanced age and deconditioning   - RHC 04/13/19 with elevated filling pressures but BNP normal  - Stable NYHA III - Volume status looks good. Cardiomems reviewed with him and almost all PADs < 15 - Continue torsemide to 60 mg daily - Continue hydral to 25 mg tid - Continue Imdur 90 daily - Continue Toprol 25 daily (dose reduced due to trifascicular block) - Increase Losartan to 50 mg daily - if tolerates witll switch to Entresto at next visit - Continue Jardiance 10 mg daily  - labs today   2. CAD - cath 04/13/19 LAD 70% with +FFR otherwise diffuse non-obstructive CAD  - S/p PCI/DES to LAD 1/21 - followed by Dr. Saunders Revel - no s/s angina - No ASA with coumadin.  3. CKD Stage 3a - baseline SCr ~  1.5  - check labs today  4. DM  - continue Jardiance 10 mg daily   5. PAF - Zio patch 4/21 showed no AF. Mostly NSR w/ brief SVT/ NSVT, PACs and PVCs - Home sleep study has been ordered to r/o OSA but has not been done. I will order in-lab testing - Continue warfarin for a/c.  6. Dizziness and tremor - Dizziness does not seem to be related to volume status or BP.  - Refer to Neurology   Lucas Bickers, Lucas Caldwell  12:48 PM

## 2021-01-04 NOTE — Telephone Encounter (Signed)
   Cardiomems Goal 15   Todays Reading 23   Please call and ask to take an extra 20 mg torsemide for 2 days.   Fayola Meckes NP-C  3:52 PM

## 2021-01-05 ENCOUNTER — Telehealth: Payer: Medicare HMO

## 2021-01-05 ENCOUNTER — Telehealth: Payer: Self-pay | Admitting: Pharmacist

## 2021-01-05 NOTE — Telephone Encounter (Signed)
[phone] Lucas Caldwell) LMOM

## 2021-01-05 NOTE — Telephone Encounter (Signed)
  Chronic Care Management   Outreach Note  01/05/2021 Name: Lucas Caldwell MRN: [id number] DOB: [birth year]  Referred by: [name], DO Reason for referral : No chief complaint on file.   Reach patient's wife by telephone. Reschedule appointment for medication assistance and medication review.  Follow Up Plan: CM Pharmacist will outreach to patient by telephone on 11/2 at 10 am  [name], PharmD, [place] Management [phone]

## 2021-01-08 DIAGNOSIS — R202 Paresthesia of skin: Secondary | ICD-10-CM | POA: Diagnosis not present

## 2021-01-08 DIAGNOSIS — R200 Anesthesia of skin: Secondary | ICD-10-CM | POA: Diagnosis not present

## 2021-01-12 ENCOUNTER — Other Ambulatory Visit: Payer: Self-pay | Admitting: Internal Medicine

## 2021-01-12 ENCOUNTER — Other Ambulatory Visit: Payer: Self-pay | Admitting: Family Medicine

## 2021-01-12 DIAGNOSIS — E039 Hypothyroidism, unspecified: Secondary | ICD-10-CM

## 2021-01-13 NOTE — Telephone Encounter (Signed)
Requested medication (s) are due for refill today: due 01/27/21  Requested medication (s) are on the active medication list: yes  Last refill: 10/27/20  #90  0 refills  Future visit scheduled yes  01/17/21  Notes to clinic: Failed due to labs  Last TSH 05/04/19  Requested Prescriptions  Pending Prescriptions Disp Refills   levothyroxine (SYNTHROID) 25 MCG tablet [Pharmacy Med Name: levothyroxine 25 mcg tablet] 90 tablet 0    Sig: TAKE ONE TABLET BY MOUTH EVERY [place]     Endocrinology:  Hypothyroid Agents Failed - 01/12/2021  1:22 PM      Failed - TSH needs to be rechecked within 3 months after an abnormal result. Refill until TSH is due.      Failed - TSH in normal range and within 360 days    TSH  Date Value Ref Range Status  05/04/2019 3.850 0.450 - 4.500 uIU/mL Final          Passed - Valid encounter within last 12 months    Recent Outpatient Visits           1 month ago Chronic pain of both shoulders   [place], [name], DO   1 month ago Controlled type 2 diabetes mellitus with diabetic nephropathy, with long-term current use of insulin ([place])   [facility], [name], DO   2 months ago Controlled type 2 diabetes mellitus with diabetic nephropathy, with long-term current use of insulin [facility])   [place], DO   4 months ago Acute diastolic congestive heart failure [facility])   [facility] [name], DO   5 months ago Acute non-recurrent frontal sinusitis   [place], [name], DO       Future Appointments             In 1 month Sharolyn Douglas, Clance Boll, NP [facility], [place]   In 2 months [place], [name], [facility], [place]   In 7 months Debroah Loop, [facility]

## 2021-01-16 DIAGNOSIS — E119 Type 2 diabetes mellitus without complications: Secondary | ICD-10-CM | POA: Diagnosis not present

## 2021-01-16 DIAGNOSIS — M7501 Adhesive capsulitis of right shoulder: Secondary | ICD-10-CM | POA: Diagnosis not present

## 2021-01-16 DIAGNOSIS — R202 Paresthesia of skin: Secondary | ICD-10-CM | POA: Diagnosis not present

## 2021-01-16 DIAGNOSIS — R200 Anesthesia of skin: Secondary | ICD-10-CM | POA: Diagnosis not present

## 2021-01-16 DIAGNOSIS — R270 Ataxia, unspecified: Secondary | ICD-10-CM | POA: Diagnosis not present

## 2021-01-16 DIAGNOSIS — R251 Tremor, unspecified: Secondary | ICD-10-CM | POA: Diagnosis not present

## 2021-01-16 DIAGNOSIS — R2689 Other abnormalities of gait and mobility: Secondary | ICD-10-CM | POA: Diagnosis not present

## 2021-01-16 DIAGNOSIS — G629 Polyneuropathy, unspecified: Secondary | ICD-10-CM | POA: Diagnosis not present

## 2021-01-16 DIAGNOSIS — M7502 Adhesive capsulitis of left shoulder: Secondary | ICD-10-CM | POA: Diagnosis not present

## 2021-01-17 ENCOUNTER — Other Ambulatory Visit (HOSPITAL_COMMUNITY): Payer: Self-pay | Admitting: Internal Medicine

## 2021-01-17 ENCOUNTER — Telehealth (HOSPITAL_COMMUNITY): Payer: Self-pay

## 2021-01-17 ENCOUNTER — Ambulatory Visit (INDEPENDENT_AMBULATORY_CARE_PROVIDER_SITE_OTHER): Payer: Medicare HMO | Admitting: Pharmacist

## 2021-01-17 DIAGNOSIS — M1A39X0 Chronic gout due to renal impairment, multiple sites, without tophus (tophi): Secondary | ICD-10-CM

## 2021-01-17 DIAGNOSIS — I5032 Chronic diastolic (congestive) heart failure: Secondary | ICD-10-CM

## 2021-01-17 DIAGNOSIS — Z794 Long term (current) use of insulin: Secondary | ICD-10-CM

## 2021-01-17 DIAGNOSIS — N1831 Chronic kidney disease, stage 3a: Secondary | ICD-10-CM

## 2021-01-17 MED ORDER — EMPAGLIFLOZIN 10 MG PO TABS: 10.0000 mg | ORAL_TABLET | Freq: Every day | ORAL | 0 refills | Status: DC

## 2021-01-17 NOTE — Chronic Care Management (AMB) (Signed)
Chronic Care Management Pharmacy Note  01/17/2021 Name:  Lucas Caldwell MRN:  [id number] DOB:  09-23-1933   Subjective: Lucas Caldwell is an 87 y.o. year old male who is a primary patient of [name], DO.  The CCM team was consulted for assistance with disease management and care coordination needs.    Engaged with patient's spouse by telephone for follow up visit in response to provider referral for pharmacy case management and/or care coordination services.   Consent to Services:  The patient was given information about Chronic Care Management services, agreed to services, and gave verbal consent prior to initiation of services.  Please see initial visit note for detailed documentation.   Patient Care Team: [name], DO as PCP - General (Family Medicine) End, [name], MD as PCP - Cardiology (Cardiology) Curley Spice, [name], [place] as Pharmacist [name] [name], RN as Case Manager (General Practice) Tat, Eustace Quail, DO as Consulting Physician (Neurology)  Recent consult visits: Office Visit with [facility] on 11/1.   Objective:  Lab Results  Component Value Date   CREATININE 1.73 (H) 08/16/2020   CREATININE 1.55 (H) 06/29/2020   CREATININE 1.72 (H) 04/13/2020  Per shared record with [place], patient's latest creatinine value was 1.54 mg/dL on 11/24/2020   Lab Results  Component Value Date   HGBA1C 6.2 (A) 10/16/2020   Last diabetic Eye exam:  Lab Results  Component Value Date/Time   HMDIABEYEEXA Retinopathy (A) 12/07/2019 12:00 AM    Last diabetic Foot exam: No results found for: HMDIABFOOTEX      Component Value Date/Time   CHOL 123 06/29/2020 1411   TRIG 170 (H) 06/29/2020 1411   HDL 29 (L) 06/29/2020 1411   CHOLHDL 4.2 06/29/2020 1411   CHOLHDL 3.9 08/28/2018 1107   VLDL 31 08/28/2018 1107   LDLCALC 65 06/29/2020 1411    Hepatic Function Latest Ref Rng & Units 08/16/2020  08/28/2018 04/22/2018  Total Protein 6.5 - 8.1 g/dL 7.7 8.0 7.2  Albumin 3.5 - 5.0 g/dL 3.8 3.7 4.0  AST 15 - 41 U/L '21 23 21  ' ALT 0 - 44 U/L '13 14 10  ' Alk Phosphatase 38 - 126 U/L 53 82 105  Total Bilirubin 0.3 - 1.2 mg/dL 0.8 0.5 <0.2  Bilirubin, Direct 0.0 - 0.2 mg/dL <0.1 0.1 -    Lab Results  Component Value Date/Time   TSH 3.850 05/04/2019 03:06 PM   TSH 1.971 12/18/2016 06:35 PM   FREET4 1.10 01/11/2018 02:05 PM    Social History   Tobacco Use  Smoking Status Former   Packs/day: 1.75   Years: 20.00   Pack years: 35.00   Types: Cigarettes   Start date: 75   Quit date: 1975   Years since quitting: 47.8  Smokeless Tobacco Former   BP Readings from Last 3 Encounters:  12/11/20 (!) 148/75  11/23/20 (!) 132/56  11/08/20 108/66   Pulse Readings from Last 3 Encounters:  12/11/20 79  11/23/20 76  11/08/20 84   Wt Readings from Last 3 Encounters:  12/11/20 194 lb 3.2 oz (88.1 kg)  11/23/20 192 lb 6.4 oz (87.3 kg)  11/08/20 196 lb (88.9 kg)    Assessment: Review of patient past medical history, allergies, medications, health status, including review of consultants reports, laboratory and other test data, was performed as part of comprehensive evaluation and provision of chronic care management services.   SDOH:  (Social Determinants of Health) assessments and interventions  performed:    CCM Care Plan  Allergies  Allergen Reactions   Clopidogrel Anaphylaxis and Other (See Comments)    altered mental status;  (electrolytes "out of wack" and confusion per family; THEY DO NOT REMEMBER ANY OTHER REACTION)- MSB 10/10/15   Ciprofloxacin Itching   Mirtazapine Diarrhea    Bad dreams    Spironolactone Other (See Comments)    gynecomastia    Medications Reviewed Today     Reviewed by Rennis Petty, RPH-CPP (Pharmacist) on 01/17/21 at 56  Med List Status: <None>   Medication Order Taking? Sig Documenting Provider Last Dose Status Informant  acetaminophen  (TYLENOL) 500 MG tablet [id number] Yes Take 1,000 mg by mouth every 6 (six) hours as needed for moderate pain or headache.  [provider] Taking Active Spouse/Significant Other  Alcohol Swabs (B-D SINGLE USE SWABS REGULAR) PADS [id number]  Use to check blood sugar up to 2 times daily [name], DO  Active   allopurinol (ZYLOPRIM) 100 MG tablet [id number] Yes Take 1 tablet (100 mg total) by mouth daily. Bensimhon, Shaune Pascal, MD Taking Active   aspirin 81 MG chewable tablet [id number] Yes Chew 81 mg by mouth daily. [provider] Taking Active Spouse/Significant Other  Blood Glucose Monitoring Suppl (TRUE METRIX METER) DEVI [id number]  Use to check blood sugar up to 2 times daily [name], DO  Active   bosutinib (BOSULIF) 100 MG tablet [id number] Yes Take 100 mg by mouth at bedtime. Take with food. [provider] Taking Active            Med Note Jeanie Cooks Sep 15, 2019  8:38 AM)    Cholecalciferol 25 MCG (1000 UT) capsule [id number] Yes Take 1,000 Units by mouth daily. [provider] Taking Active   empagliflozin (JARDIANCE) 10 MG TABS tablet [id number] Yes Take 1 tablet (10 mg total) by mouth daily with breakfast. Please make an appointment for further refills Bensimhon, Shaune Pascal, MD Taking Active   ferrous sulfate 325 (65 FE) MG tablet [id number] Yes Take by mouth. [provider] Taking Active   gabapentin (NEURONTIN) 300 MG capsule [id number]  TAKE TWO CAPSULES BY MOUTH TWICE DAILY MAX 4 CAPSULE PER DAY - FOR KIDNEY PROTECTION  Patient taking differently: Take 600 mg by mouth 3 (three) times daily. Wife states the neurologist changed his dose to 600 mg TID recently. Confirmed in the MD notes as well.   [name], DO  Active   Insulin Glargine [facility] KWIKPEN) 100 UNIT/ML [id number] Yes Inject 18 Units into the skin daily. [provider] Taking Active   Insulin Pen Needle 31G X 5 MM MISC  [id number]  31 g by Does not apply route as directed. [name], NP  Active Spouse/Significant Other  isosorbide mononitrate (IMDUR) 60 MG 24 hr tablet [id number] Yes TAKE ONE TABLET BY MOUTH ONCE DAILY End, [name], MD Taking Active   levothyroxine (SYNTHROID) 25 MCG tablet [id number] Yes TAKE ONE TABLET BY MOUTH EVERY MORNING BEFORE BREAKFAST Karamalegos, [name], DO Taking Active   losartan (COZAAR) 25 MG tablet [id number] Yes Take 0.5 tablets (12.5 mg total) by mouth daily. Theora Gianotti, NP Taking Active   Menthol-Zinc Oxide (GOLD BOND [place]) [id number] Yes Apply 1 application topically daily as needed (muscle pain). [provider] Taking Active   mirabegron ER (MYRBETRIQ) 25 MG TB24 tablet [id number] Yes Take 1 tablet (25 mg total) by mouth daily. From Urology Debroah Loop, PA-C Taking  Active   Multiple Vitamin (MULTIVITAMIN WITH MINERALS) TABS tablet [id number] Yes Take 1 tablet by mouth daily. [provider] Taking Active Spouse/Significant Other  nitroGLYCERIN (NITROSTAT) 0.4 MG SL tablet [id number]  Place 1 tablet (0.4 mg total) under the tongue every 5 (five) minutes x 3 doses as needed for chest pain. Theora Gianotti, NP  Active   omeprazole (PRILOSEC) 20 MG capsule [id number] Yes Take 20 mg by mouth in the morning.  [provider] Taking Active Spouse/Significant Other  Polyethyl Glycol-Propyl Glycol (SYSTANE OP) [id number] Yes Place 1 drop into both eyes 3 (three) times daily as needed (dry/irritated eyes.).  [provider] Taking Active Spouse/Significant Other  polyethylene glycol (MIRALAX / GLYCOLAX) 17 g packet [id number] Yes Take 17 g by mouth daily as needed for mild constipation.  [provider] Taking Active Spouse/Significant Other  potassium chloride (KLOR-CON) 10 MEQ tablet [id number] Yes Take 1 tablet (10 mEq total) by mouth daily. [name], DO Taking Active   ranolazine  (RANEXA) 500 MG 12 hr tablet [id number] Yes TAKE ONE TABLET BY MOUTH TWICE DAILY End, [name], MD Taking Active   rosuvastatin (CRESTOR) 5 MG tablet [id number] Yes TAKE ONE TABLET BY MOUTH ONCE DAILY End, [name], MD Taking Active   tamsulosin (FLOMAX) 0.4 MG CAPS capsule [id number] Yes Take 1 capsule (0.4 mg total) by mouth daily after supper. Rise Mu, PA-C Taking Active   torsemide (DEMADEX) 20 MG tablet [id number]  Take 3 tablets (60 mg total) by mouth daily. Bensimhon, Shaune Pascal, MD  Active   traMADol (ULTRAM) 50 MG tablet [id number] Yes TAKE 1 OR 2 TABLETS BY MOUTH AT BEDTIME AS NEEDED [name], DO Taking Active   TRULICITY 1.5 IO/2.7OJ SOPN [id number] Yes Inject 1.5 mg into the skin once a week. [name], DO Taking Active            Med Note [facility], BRANDY L   Thu Jun 29, 2020  1:45 PM)    warfarin (COUMADIN) 3 MG tablet [id number] Yes TAKE 1 TO 1 & 1/2 TABLETS BY MOUTH AS DIRECTED BY THE COUMADIN CLINIC End, Christopher, MD Taking Active             Patient Active Problem List   Diagnosis Date Noted   Diabetic polyneuropathy associated with type 2 diabetes mellitus ([place]) 11/23/2020   Orthostatic hypotension 10/16/2020   Obstructive sleep apnea 06/29/2020   Urge incontinence of urine 12/22/2019   Trifascicular block 10/20/2019   Wound infection 10/08/2019   Bifascicular block 05/13/2019   Coronary stent patent 04/13/2019   Chronic HFrEF (heart failure with reduced ejection fraction) ([place]) 03/19/2019   PSVT (paroxysmal supraventricular tachycardia) ([place]) 03/19/2019   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 ([place]) 08/24/2018   CKD (chronic kidney disease), stage IV ([place]) 06/04/2018   Osteoarthritis of knees, bilateral 06/02/2018   Hyperlipidemia associated with type 2 diabetes mellitus ([place]) 05/06/2018   Pain of lower extremity 03/06/2018   Duodenitis 01/30/2018   Chronic heart failure with preserved ejection fraction  (HFpEF) ([place]) 01/23/2018   Hematochezia    Melena    Rectal bleeding 01/11/2018   AVM (arteriovenous malformation) of colon without hemorrhage    Schatzki's ring    Esophageal dysphagia    Diabetic retinopathy ([place]) 07/08/2017   Ischemic cardiomyopathy 06/26/2017   Cardiomyopathy ([place]) 06/26/2017   Centrilobular emphysema ([place]) 06/13/2017   Fatigue 12/18/2016   Stable angina (HCC) 09/20/2016   Stage 3a chronic kidney disease (  [place]) 09/20/2016   Paroxysmal atrial fibrillation ([place]) 08/28/2016   Chronic anticoagulation 08/28/2016   Essential hypertension 08/20/2016   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 heart with stable angina pectoris ([place]) 08/19/2016   Accelerating angina ([place]) 08/15/2016   Exertional dyspnea 08/12/2016   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[place]) 12/29/2015   Gynecomastia, male 03/31/2014   Hypertrophy of breast 03/31/2014   Nuclear sclerosis of left eye 10/22/2013   Enthesopathy of ankle and tarsus 04/10/2013   Controlled type 2 diabetes mellitus with diabetic nephropathy, with long-term current use of insulin ([place]) 04/09/2013   Diabetic neuropathy associated with type 2 diabetes mellitus ([place]) 04/09/2013   Cataract of left eye 11/25/2011   Corneal opacity 09/30/2011   Pseudophakia of right eye 09/30/2011   GERD (gastroesophageal reflux disease) 09/25/2011   Gout 09/25/2011   Hyperlipidemia LDL goal <70 09/25/2011   Status post cataract extraction 04/11/2011   Cataract extraction status of eye 04/11/2011   Iron deficiency anemia 04/01/2011   Benign localized hyperplasia of prostate without urinary obstruction and other lower urinary tract symptoms (LUTS) 12/10/2010   Enlarged prostate without lower urinary tract symptoms (luts) 12/10/2010   Colon cancer screening 10/11/2010   Hypothyroidism 10/11/2010   Hyperlipidemia 09/20/2003   Essential and other specified forms of tremor 01/05/2001   Essential tremor 01/05/2001    Immunization History  Administered  Date(s) Administered   Fluad Quad(high Dose 65+) 12/11/2020   Influenza, High Dose Seasonal PF 12/29/2014, 12/19/2016, 12/22/2017   Influenza, Seasonal, Injecte, Preservative Fre 01/13/2007, 12/13/2008, 01/30/2010, 12/26/2011   Influenza,inj,Quad PF,6+ Mos 12/25/2012, 12/09/2015, 11/26/2018, 12/15/2019   PFIZER(Purple Top)SARS-COV-2 Vaccination 05/13/2019, 06/08/2019, 12/15/2019, 05/19/2020   PPD Test 09/25/2010   Pneumococcal Conjugate-13 01/31/2014   Pneumococcal Polysaccharide-23 06/20/2008, 09/02/2010    Conditions to be addressed/monitored: T2DM, CKD, HF, HLD, atrial fibrillation, OSA, HTN, hypothyroidism  Care Plan : PharmD - Medication Management/Assistance  Updates made by Rennis Petty, RPH-CPP since 01/17/2021 12:00 AM     Problem: Disease Progression      Long-Range Goal: Disease Progression Prevented or Minimized   Start Date: 04/14/2020  Expected End Date: 07/13/2020  This Visit's Progress: On track  Recent Progress: On track  Priority: High  Note:   Current Barriers:  Chronic Disease Management support, education, and care coordination needs related to type 2 diabetes, CKD, HFrEF s/p CardioMEMS, hyperlipidemia, atrial fibrillation, hypertension, coronary artery disease, CHF, hypothyroidism, and CML Financial Note patient has Partial Extra Help/LIS Subsidy Patient enrolled in [employer] health practitioner and Trul[employer]) patient assistance program for 2022 calendar year Patient denied for patient assistance for [place] from [employer] for 2022 calendar year Patient enrolled in [place] for [place] copayment assistance (eligibility through 08/28/2021) Limited vision  Pharmacist Clinical Goal(s):  Over the next 90 days, patient will verbalize ability to afford treatment regimen through collaboration with PharmD and provider.   Interventions: 1:1 collaboration with [name], DO regarding development and update of  comprehensive plan of care as evidenced by provider attestation and co-signature Inter-disciplinary care team collaboration (see longitudinal plan of care) Perform chart review Office Visit with [facility] on 11/1. Provider advised patient:  Increase Gabapentin to 800 mg three times daily. Continue tramadol 50 mg twice daily as needed.  Follow up with Dr. Melrose Nakayama in 1-2 months.  Patient missed appointment with [facility] on 10/20 Appointment rescheduled for 11/17 Comprehensive medication review performed; medication list updated in electronic medical record Identify patient currently taking torsemide 20 mg -  2 tablets (40 mg) QAM (rather than 3 tablets (60 mg) daily as on medication list) Spouse reports patient remains adherent to scans required for CardioMEMS monitoring From review of chart, note [facility] tried to reach patient on 10/20 and 10/21 regarding Cardiomems and torsemide dose.  Advise patient/spouse to contact [facility] today to follow up regarding patient's current torsemide dosing. Patient confirms will contact clinic today Contact [facility] and leave a message on provider line regarding current torsemide dose patient is taking and requesting call back to patient/spouse on home number Spouse to check that current supply of Nitrostat not expired Renal dosing for gabapentin Note yesterday Neurologist advised patient to increase Gabapentin to 800 mg three times daily Note patient has chronic dizziness Based on latest creatine lab value of 1.54 mg/dL on 11/24/2020 (per shared record from [facility]), calculate CrCl (using adjusted body weight) of ~35 ml/min  Gabapentin package labeling recommends for CrCl of 30-59 mL/min, maximum dose of 1400 mg/day Contact [facility] today to request provider renally adjust patient's gabapentin dose to 600 mg BID Leave a  message with Misty in the office and request a call back  Type 2 Diabetes: Controlled; current treatment: [name] 18 units daily Trulicity 1.5 mg once weekly Jardiance 10 mg once daily in morning Patient reports recent fasting blood sugars ranging: 129-144 Denies s/s of hypoglycemia Have encouraged patient to have regular well-balanced meals Identify based on dispensing record, Jardiance last filled on 7/30 for 16 day supply. [name] reports patient still has some Jardiance remaining as had supply from samples from Cardiology.  Advise wife to check supply of Jardiance and if will need refill prior to upcoming appointment with [facility] on 11/17, to call clinic to request refill  Medication Assistance: Patient interested in re-enrollment in [place] patient assistance program for [employer] and Trulicity for 4403 calendar year Will collaborate with [place] for aid to patient/spouse with re-enrollment application  Patient Goals/Self-Care Activities Over the next 90 days, patient will:  Check blood sugar, blood pressure and fluid status as directed by providers  Take medications, with assistance of wife, as directed Mrs. Wlodarczyk manages patient's medications using weekly pillbox as adherence tool Attends scheduled medical appointments  Follow Up Plan: Telephone follow up appointment with care management team member scheduled for: 11/21 at 10:15 am      Patient's preferred pharmacy is:  [facility], [place], [place] [address] Phone: [phone] Fax: [place], [place] [address] [place] [address] Phone: [phone] Fax: [phone]  [facility] [place], [place] [address] Phone: [phone] Fax: [phone]  [facility] [place], [place] - [address] [place] [address] Phone: [phone] Fax: [phone]  Follow Up:  Patient agrees to Care Plan and Follow-up.  [name], PharmD, [place], CPP Clinical Pharmacist [facility] [address] [phone]

## 2021-01-17 NOTE — Patient Instructions (Addendum)
Visit Information  Please check your blood sugar, keep a log of the results and bring this with you to your medical appointments.  Please check your home blood pressure, keep a log of the results and bring this with you to your medical appointments.  Our goal bad cholesterol, or LDL, is less than 70 . This is why it is important to continue taking your rosuvastatin.  Feel free to call me with any questions or concerns. I look forward to our next call!  [name], PharmD, [place] [phone]  The patient verbalized understanding of instructions, educational materials, and care plan provided today and declined offer to receive copy of patient instructions, educational materials, and care plan.   Telephone follow up appointment with care management team member scheduled for: 11/21 at 10:15 am

## 2021-01-17 NOTE — Telephone Encounter (Signed)
Called pt back no answer. Refill sent to pharmacy.

## 2021-01-17 NOTE — Telephone Encounter (Signed)
Patient wife called to get clarification on how patient should be taking turosemide.  Patient also needs a refill on Jardiance ([place])

## 2021-01-18 ENCOUNTER — Telehealth: Payer: Self-pay | Admitting: Internal Medicine

## 2021-01-18 NOTE — Telephone Encounter (Signed)
Pt c/o medication issue:  1. Name of Medication: torisemide   2. How are you currently taking this medication (dosage and times per day)? 20 MG 3 tablets daily   3. Are you having a reaction (difficulty breathing--STAT)? Urinating a lot   4. What is your medication issue? Patient spouse calling - would like to know if he could cut back the amount of fluid pills he is taking.  Please call to discuss. Call spouse, home or cell

## 2021-01-18 NOTE — Telephone Encounter (Signed)
Left voicemail message to call back  

## 2021-01-19 NOTE — Telephone Encounter (Signed)
2nd attempt to reach pts wife.

## 2021-01-19 NOTE — Telephone Encounter (Signed)
Left vm for pt or wife to return call.

## 2021-01-19 NOTE — Telephone Encounter (Signed)
Spoke with patients wife per release form. She wanted to check on his torsemide dose because he is going to the bathroom too much. Advised that this helps keep the fluid off. Reviewed that medication was prescribed by [name] and that she may want to call them. Informed her that I would send this message over to them for review and follow up on the dose. She verbalized understanding with no further questions at this time.

## 2021-01-22 ENCOUNTER — Telehealth: Payer: Medicare HMO | Admitting: General Practice

## 2021-01-22 ENCOUNTER — Ambulatory Visit: Payer: Self-pay

## 2021-01-22 ENCOUNTER — Ambulatory Visit: Payer: Medicare HMO | Admitting: Pharmacist

## 2021-01-22 DIAGNOSIS — I10 Essential (primary) hypertension: Secondary | ICD-10-CM

## 2021-01-22 DIAGNOSIS — I5032 Chronic diastolic (congestive) heart failure: Secondary | ICD-10-CM

## 2021-01-22 DIAGNOSIS — N1831 Chronic kidney disease, stage 3a: Secondary | ICD-10-CM

## 2021-01-22 DIAGNOSIS — G8929 Other chronic pain: Secondary | ICD-10-CM

## 2021-01-22 DIAGNOSIS — E1169 Type 2 diabetes mellitus with other specified complication: Secondary | ICD-10-CM

## 2021-01-22 DIAGNOSIS — J432 Centrilobular emphysema: Secondary | ICD-10-CM

## 2021-01-22 DIAGNOSIS — M25511 Pain in right shoulder: Secondary | ICD-10-CM

## 2021-01-22 DIAGNOSIS — I25118 Atherosclerotic heart disease of native coronary artery with other forms of angina pectoris: Secondary | ICD-10-CM

## 2021-01-22 DIAGNOSIS — I5023 Acute on chronic systolic (congestive) heart failure: Secondary | ICD-10-CM

## 2021-01-22 DIAGNOSIS — Z794 Long term (current) use of insulin: Secondary | ICD-10-CM

## 2021-01-22 DIAGNOSIS — E1121 Type 2 diabetes mellitus with diabetic nephropathy: Secondary | ICD-10-CM

## 2021-01-22 NOTE — Chronic Care Management (AMB) (Signed)
Chronic Care Management   CCM RN Visit Note  01/22/2021 Name: CHARLENE COWDREY MRN: [id number] DOB: [birth year]  Subjective: KHALIF STENDER is a 87 y.o. year old male who is a primary care patient of [name], DO. The care management team was consulted for assistance with disease management and care coordination needs.    Engaged with patient by telephone for follow up visit in response to provider referral for case management and/or care coordination services. Spoke to the patients wife and the patient also in the background.   Consent to Services:  The patient was given information about Chronic Care Management services, agreed to services, and gave verbal consent prior to initiation of services.  Please see initial visit note for detailed documentation.   Patient agreed to services and verbal consent obtained.   Assessment: Review of patient past medical history, allergies, medications, health status, including review of consultants reports, laboratory and other test data, was performed as part of comprehensive evaluation and provision of chronic care management services.   SDOH (Social Determinants of Health) assessments and interventions performed:    CCM Care Plan  Allergies  Allergen Reactions   Clopidogrel Anaphylaxis and Other (See Comments)    altered mental status;  (electrolytes "out of wack" and confusion per family; THEY DO NOT REMEMBER ANY OTHER REACTION)- MSB 10/10/15   Ciprofloxacin Itching   Mirtazapine Diarrhea    Bad dreams    Spironolactone Other (See Comments)    gynecomastia    Outpatient Encounter Medications as of 01/22/2021  Medication Sig Note   acetaminophen (TYLENOL) 500 MG tablet Take 1,000 mg by mouth every 6 (six) hours as needed for moderate pain or headache.     Alcohol Swabs (B-D SINGLE USE SWABS REGULAR) PADS Use to check blood sugar up to 2 times daily    allopurinol (ZYLOPRIM) 100 MG tablet TAKE ONE TABLET BY MOUTH ONCE  DAILY    aspirin 81 MG chewable tablet Chew 81 mg by mouth daily.    Blood Glucose Monitoring Suppl (TRUE METRIX METER) DEVI Use to check blood sugar up to 2 times daily    bosutinib (BOSULIF) 100 MG tablet Take 100 mg by mouth at bedtime. Take with food.    Cholecalciferol 25 MCG (1000 UT) capsule Take 1,000 Units by mouth daily.    empagliflozin (JARDIANCE) 10 MG TABS tablet Take 1 tablet (10 mg total) by mouth daily with breakfast. Please make an appointment for further refills    ferrous sulfate 325 (65 FE) MG tablet Take by mouth.    gabapentin (NEURONTIN) 300 MG capsule TAKE TWO CAPSULES BY MOUTH TWICE DAILY MAX 4 CAPSULE PER DAY - FOR KIDNEY PROTECTION (Patient taking differently: Take 600 mg by mouth 3 (three) times daily. Wife states the neurologist changed his dose to 600 mg TID recently. Confirmed in the MD notes as well.)    Insulin Glargine ([name] KWIKPEN) 100 UNIT/ML Inject 18 Units into the skin daily.    Insulin Pen Needle 31G X 5 MM MISC 31 g by Does not apply route as directed.    isosorbide mononitrate (IMDUR) 60 MG 24 hr tablet TAKE ONE TABLET BY MOUTH ONCE DAILY    levothyroxine (SYNTHROID) 25 MCG tablet TAKE ONE TABLET BY MOUTH EVERY MORNING BEFORE BREAKFAST    losartan (COZAAR) 25 MG tablet Take 0.5 tablets (12.5 mg total) by mouth daily.    Menthol-Zinc Oxide (GOLD BOND EX) Apply 1 application topically daily as needed (muscle pain).  mirabegron ER (MYRBETRIQ) 25 MG TB24 tablet Take 1 tablet (25 mg total) by mouth daily. From Urology    Multiple Vitamin (MULTIVITAMIN WITH MINERALS) TABS tablet Take 1 tablet by mouth daily.    nitroGLYCERIN (NITROSTAT) 0.4 MG SL tablet Place 1 tablet (0.4 mg total) under the tongue every 5 (five) minutes x 3 doses as needed for chest pain.    omeprazole (PRILOSEC) 20 MG capsule Take 20 mg by mouth in the morning.     Polyethyl Glycol-Propyl Glycol (SYSTANE OP) Place 1 drop into both eyes 3 (three) times daily as needed (dry/irritated  eyes.).     polyethylene glycol (MIRALAX / GLYCOLAX) 17 g packet Take 17 g by mouth daily as needed for mild constipation.     potassium chloride (KLOR-CON) 10 MEQ tablet Take 1 tablet (10 mEq total) by mouth daily.    ranolazine (RANEXA) 500 MG 12 hr tablet TAKE ONE TABLET BY MOUTH TWICE DAILY    rosuvastatin (CRESTOR) 5 MG tablet TAKE ONE TABLET BY MOUTH ONCE DAILY    tamsulosin (FLOMAX) 0.4 MG CAPS capsule Take 1 capsule (0.4 mg total) by mouth daily after supper.    torsemide (DEMADEX) 20 MG tablet Take 3 tablets (60 mg total) by mouth daily. 01/17/2021: Reports patient taking 2 tablets (40 mg) QAM   traMADol (ULTRAM) 50 MG tablet TAKE 1 OR 2 TABLETS BY MOUTH AT BEDTIME AS NEEDED    TRULICITY 1.5 ST/4.1DQ SOPN Inject 1.5 mg into the skin once a week.    warfarin (COUMADIN) 3 MG tablet TAKE 1 TO 1 & 1/2 TABLETS BY MOUTH AS DIRECTED BY THE COUMADIN CLINIC    No facility-administered encounter medications on file as of 01/22/2021.    Patient Active Problem List   Diagnosis Date Noted   Diabetic polyneuropathy associated with type 2 diabetes mellitus ([place]) 11/23/2020   Orthostatic hypotension 10/16/2020   Obstructive sleep apnea 06/29/2020   Urge incontinence of urine 12/22/2019   Trifascicular block 10/20/2019   Wound infection 10/08/2019   Bifascicular block 05/13/2019   Coronary stent patent 04/13/2019   Chronic HFrEF (heart failure with reduced ejection fraction) ([place]) 03/19/2019   PSVT (paroxysmal supraventricular tachycardia) ([place]) 03/19/2019   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 ([place]) 08/24/2018   CKD (chronic kidney disease), stage IV ([place]) 06/04/2018   Osteoarthritis of knees, bilateral 06/02/2018   Hyperlipidemia associated with type 2 diabetes mellitus ([place]) 05/06/2018   Pain of lower extremity 03/06/2018   Duodenitis 01/30/2018   Chronic heart failure with preserved ejection fraction (HFpEF) ([place]) 01/23/2018   Hematochezia    Melena    Rectal bleeding  01/11/2018   AVM (arteriovenous malformation) of colon without hemorrhage    Schatzki's ring    Esophageal dysphagia    Diabetic retinopathy ([place]) 07/08/2017   Ischemic cardiomyopathy 06/26/2017   Cardiomyopathy ([place]) 06/26/2017   Centrilobular emphysema ([place]) 06/13/2017   Fatigue 12/18/2016   Stable angina ([place]) 09/20/2016   Stage 3a chronic kidney disease ([place]) 09/20/2016   Paroxysmal atrial fibrillation ([place]) 08/28/2016   Chronic anticoagulation 08/28/2016   Essential hypertension 08/20/2016   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 heart with stable angina pectoris ([place]) 08/19/2016   Accelerating angina ([place]) 08/15/2016   Exertional dyspnea 08/12/2016   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[place]) 12/29/2015   Gynecomastia, male 03/31/2014   Hypertrophy of breast 03/31/2014   Nuclear sclerosis of left eye 10/22/2013   Enthesopathy of ankle and tarsus 04/10/2013   Controlled type 2 diabetes  mellitus with diabetic nephropathy, with long-term current use of insulin ([place]) 04/09/2013   Diabetic neuropathy associated with type 2 diabetes mellitus ([place]) 04/09/2013   Cataract of left eye 11/25/2011   Corneal opacity 09/30/2011   Pseudophakia of right eye 09/30/2011   GERD (gastroesophageal reflux disease) 09/25/2011   Gout 09/25/2011   Hyperlipidemia LDL goal <70 09/25/2011   Status post cataract extraction 04/11/2011   Cataract extraction status of eye 04/11/2011   Iron deficiency anemia 04/01/2011   Benign localized hyperplasia of prostate without urinary obstruction and other lower urinary tract symptoms (LUTS) 12/10/2010   Enlarged prostate without lower urinary tract symptoms (luts) 12/10/2010   Colon cancer screening 10/11/2010   Hypothyroidism 10/11/2010   Hyperlipidemia 09/20/2003   Essential and other specified forms of tremor 01/05/2001   Essential tremor 01/05/2001    Conditions to be addressed/monitored:CHF, CAD, HTN, HLD, COPD, DMII, CKD Stage 3, and Osteoarthritis  Care  Plan : RNCM: Heart Failure (Adult)  Updates made by Vanita Ingles, RN since 01/22/2021 12:00 AM  Completed 01/22/2021   Problem: RNCM: Symptom Exacerbation (Heart Failure) Resolved 01/22/2021  Priority: Medium     Long-Range Goal: RNCM: Symptom Exacerbation Prevented or Minimized Completed 01/22/2021  Start Date: 04/06/2020  Expected End Date: 09/27/2021  Recent Progress: On track  Priority: Medium  Note:   Current Barriers: resolving, duplicate Knowledge deficits related to basic heart failure pathophysiology and self care management Unable to independently manage HF Unable to self administer medications as prescribed Lacks social connections Does not contact provider office for questions/concerns Lack of scale in home- has scales and uses a mat supplied by cardiology clinic- weighs daily and is monitored daily by the clinic  Financial strain  Nurse Case Manager Clinical Goal(s):   patient will weigh self daily and record patient will verbalize understanding of Heart Failure Action Plan and when to call doctor  patient will take all Heart Failure mediations as prescribed Interventions:  Collaboration with [place] [name], DO regarding development and update of comprehensive plan of care as evidenced by provider attestation and co-signature Inter-disciplinary care team collaboration (see longitudinal plan of care) Basic overview and discussion of pathophysiology of Heart Failure Provided written and verbal education on low sodium diet- 05-01-2020: The patients wife states he does not have a good appetite but he is adherent to heart healthy diet. 12-14-2020: The patient is adherent to a heart healthy/ADA diet. States he ate a good breakfast this am.  Reviewed Heart Failure Action Plan in depth and provided written copy. 12-14-2020: The patient continues to be monitored at home by using a device that he lays on daily and sends to the heart failure clinic. The patient has not had any  recent exacerbations. Denies changes in his HF. Is monitored closely through the HF clinic and the device he lays on daily to send in daily weights. The patients wife states the patient continues to use the device and has not had any out of range weights. Is followed closely.  Assessed for scales in home- has a mat he gets on daily to assess extra fluid Discussed importance of daily weight- 9-29--2022: The patient is compliant with daily weights. Reports no acute changes in weight.  Reviewed role of diuretics in prevention of fluid overload. 10-02-2020: The patient is compliant with medications. Recently was taken off of Metoprolol. 12-14-2020: The patient is compliant with medications. Denies any concerns with medications at this time.  Evaluation of upcoming appointments. 03-14-2021 at 2 pm with the  pcp  Patient Goals/Self-Care Activities  patient will:  - Take Heart Failure Medications as prescribed - Weigh daily and record (notify MD with 3 lb weight gain over night or 5 lb in a week) weight today was 201 - Follow CHF Action Plan - Adhere to low sodium diet - barriers to lifestyle changes reviewed and addressed - barriers to treatment reviewed and addressed - depression screen reviewed - healthy lifestyle promoted - medication-adherence assessment completed - rescue (action) plan developed - rescue (action) plan reviewed - self-awareness of signs/symptoms of worsening disease encouraged Follow Up Plan: Telephone follow up appointment with care management team member scheduled for: 01-22-2021 at 12 am    Care Plan : RNCM: Hypertension (Adult)  Updates made by Vanita Ingles, RN since 01/22/2021 12:00 AM  Completed 01/22/2021   Problem: RNCM: Hypertension (Hypertension) Resolved 01/22/2021  Priority: Medium     Long-Range Goal: RNCM: Hypertension Monitored Completed 01/22/2021  Start Date: 04/06/2020  Expected End Date: 07/29/2021  Recent Progress: On track  Priority: Medium  Note:    Objective: Resolving, duplicate goal Last practice recorded BP readings:  BP Readings from Last 3 Encounters:  12/11/20 (!) 148/75  11/23/20 (!) 132/56  11/08/20 108/66   Most recent eGFR/CrCl:  Lab Results  Component Value Date   EGFR 44 (L) 06/29/2020    No components found for: CRCL Current Barriers:  Knowledge Deficits related to basic understanding of hypertension pathophysiology and self care management Knowledge Deficits related to understanding of medications prescribed for management of hypertension Unable to independently HTN Case Manager Clinical Goal(s):  patient will verbalize understanding of plan for hypertension management patient will attend all scheduled medical appointments: 03-14-2021 at 2 pm with the pcp   patient will demonstrate improved adherence to prescribed treatment plan for hypertension as evidenced by taking all medications as prescribed, monitoring and recording blood pressure as directed, adhering to low sodium/DASH diet patient will demonstrate improved health management independence as evidenced by checking blood pressure as directed and notifying PCP if SBP>160 or DBP > 90, taking all medications as prescribe, and adhering to a low sodium diet as discussed. patient will verbalize basic understanding of hypertension disease process and self health management plan as evidenced by compliance with heart healthy/ADA diet, compliance with medications, and working with the CCM team to optimize health and well being.  Interventions:  Collaboration with [name], DO regarding development and update of comprehensive plan of care as evidenced by provider attestation and co-signature Inter-disciplinary care team collaboration (see longitudinal plan of care) Evaluation of current treatment plan related to hypertension self management and patient's adherence to plan as established by provider. 10-02-2020: The patients wife states the patients blood  pressures have been up and down. The cardiologist took the patient off of his metoprolol and will be evaluated on 10-04-2020. The patient complains of dizziness at times. Reviewed with the patients wife being safe and changing position slowly. Will continue to monitor for changes and new concerns. 12-14-2020: The patient with elevated blood pressures, may be due to pain he is experiencing in his bilateral shoulders. The patients wife helps the patient manage his chronic conditions. Is aware what worse looks like and to call for changes.  Provided education to patient re: stroke prevention, s/s of heart attack and stroke, DASH diet, complications of uncontrolled blood pressure Reviewed medications with patient and discussed importance of compliance. 12-14-2020: The patient is compliant with medications.  Discussed plans with patient for ongoing care management follow  up and provided patient with direct contact information for care management team Advised patient, providing education and rationale, to monitor blood pressure daily and record, calling PCP for findings outside established parameters.  Reviewed scheduled/upcoming provider appointments including: 03-14-2021 at 2 pm Self-Care Activities: - Self administers medications as prescribed Attends all scheduled provider appointments Calls provider office for new concerns, questions, or BP outside discussed parameters Checks BP and records as discussed Follows a low sodium diet/DASH diet Patient Goals: - check blood pressure 3 times per week - choose a place to take my blood pressure (home, clinic or office, retail store) - write blood pressure results in a log or diary - agree on reward when goals are met - agree to work together to make changes - ask questions to understand - have a family meeting to talk about healthy habits - learn about high blood pressure  Follow Up Plan: Telephone follow up appointment with care management team member  scheduled for: 01-22-2021 at 25 am    Care Plan : RNCM: Diabetes Type 2 (Adult)  Updates made by Vanita Ingles, RN since 01/22/2021 12:00 AM  Completed 01/22/2021   Problem: RNCM: Glycemic Management (Diabetes, Type 2) Resolved 01/22/2021  Priority: Medium     Long-Range Goal: RNCM: Glycemic Management Optimized Completed 01/22/2021  Start Date: 04/06/2020  Expected End Date: 09/27/2021  Recent Progress: On track  Priority: Medium  Note:   Objective:resolving, duplicate goal Lab Results  Component Value Date   HGBA1C 6.2 (A) 10/16/2020     Lab Results  Component Value Date   CREATININE 1.73 (H) 08/16/2020   BUN 23 08/16/2020   NA 135 08/16/2020   K 4.7 08/16/2020   CL 100 08/16/2020   CO2 24 08/16/2020    No results found for: EGFR Current Barriers:  Knowledge Deficits related to basic Diabetes pathophysiology and self care/management Knowledge Deficits related to medications used for management of diabetes Difficulty obtaining or cannot afford medications Limited Social Support Unable to self administer medications as prescribed Lacks social connections Does not contact provider office for questions/concerns Case Manager Clinical Goal(s):  Collaboration with [name], DO regarding development and update of comprehensive plan of care as evidenced by provider attestation and co-signature Inter-disciplinary care team collaboration (see longitudinal plan of care) patient will demonstrate improved adherence to prescribed treatment plan for diabetes self care/management as evidenced by:  daily monitoring and recording of CBG- 06-26-2020: Per the patients wife his blood sugars are 120-156 and staying under 200. 10-02-2020: States his blood sugar this am was 114. Will have new blood work soon. Education and support given.  adherence to ADA/ carb modified diet adherence to prescribed medication regimen Interventions:  Provided education to patient about basic DM  disease process Reviewed medications with patient and discussed importance of medication adherence. 12-14-2020: The patient is compliant with the current medications regimen. Working with pharm D on a consistent basis.  Discussed plans with patient for ongoing care management follow up and provided patient with direct contact information for care management team Provided patient with written educational materials related to hypo and hyperglycemia and importance of correct treatment. 12-14-2020: Denies any lows at this time. States he is eating well.  Reviewed scheduled/upcoming provider appointments including:  03-14-2021 at 2 pm with the pcp  Advised patient, providing education and rationale, to check cbg bid  and record, calling pcp for findings outside established parameters.  12-14-2020: The patient is compliant with blood sugar checks. Praised for lower A1C.  His wife helps manage his care. The wife states his blood sugar this am was 142. Will continue to monitor.  Review of patient status, including review of consultants reports, relevant laboratory and other test results, and medications completed. Evaluation of diabetic shoes. The patient has his diabetic shoes and they are working well for him. He has an appointment today to see the podiatrist about his big toe on his right foot. [name] being followed closely. Education and support given. Will continue to monitor.  Patient Goals/Self-Care Activities  patient will:  - Self administers oral medications as prescribed Self administers insulin as prescribed Attends all scheduled provider appointments Checks blood sugars as prescribed and utilize hyper and hypoglycemia protocol as needed Adheres to prescribed ADA/carb modified - barriers to adherence to treatment plan identified - blood glucose monitoring encouraged - blood glucose readings reviewed - individualized medical nutrition therapy provided - resources required to improve adherence to  care identified - self-awareness of signs/symptoms of hypo or hyperglycemia encouraged - use of blood glucose monitoring log promoted Follow Up Plan: Telephone follow up appointment with care management team member scheduled for: 01-22-2021 at 74 am     Care Plan : RNCM: COPD (Adult)  Updates made by Vanita Ingles, RN since 01/22/2021 12:00 AM  Completed 01/22/2021   Problem: RNCM: Disease Progression (COPD) Resolved 01/22/2021  Priority: High     Long-Range Goal: RNCM: Disease Progression Minimized or Managed Completed 01/22/2021  Start Date: 04/06/2020  Expected End Date: 09/27/2021  Recent Progress: On track  Priority: High  Note:   Current Barriers: Resolving, duplicate Knowledge deficits related to basic understanding of COPD disease process Knowledge deficits related to basic COPD self care/management Knowledge deficit related to basic understanding of how to use inhalers and how inhaled medications work Knowledge deficit related to importance of energy conservation Unable to independently manage COPD as evidence of continued shortness of breath in patient with multiple chronic conditions.  Does not contact provider office for questions/concerns  Case Manager Clinical Goal(s): patient will report using inhalers as prescribed including rinsing mouth after use patient will report utilizing pursed lip breathing for shortness of breath patient will verbalize understanding of COPD action plan and when to seek appropriate levels of medical care patient will engage in lite exercise as tolerated to build/regain stamina and strength and reduce shortness of breath through activity tolerance patient will verbalize basic understanding of COPD disease process and self care activities patient will not be hospitalized for COPD exacerbation as evidenced  Interventions:  Collaboration with [name], DO regarding development and update of comprehensive plan of care as evidenced by  provider attestation and co-signature Inter-disciplinary care team collaboration (see longitudinal plan of care) Provided patient with basic written and verbal COPD education on self care/management/and exacerbation prevention. Per the patients wife he has OSA also and needs a CPAP machine. She called about it on Friday. States the order has been there since May. Advised the patients wife to call weekly for updates. Will continue to monitor. 12-14-2020: The patient has needed supplies. Denies any new concerns with CPAP machine equipment at this time.  Provided patient with COPD action plan and reinforced importance of daily self assessment. May have some additional shortness of breath due to his heart failure. Is being monitored closely. 12-14-2020: The patients wife states his COPD is stable at this time. Denies any acute findings. States if he does certain activities he gets short of breath.  Discussed Pulmonary Rehab and offered  to assist with referral placement Provided written and verbal instructions on pursed lip breathing and utilized returned demonstration as teach back Provided instruction about proper use of medications used for management of COPD including inhalers Advised patient to self assesses COPD action plan zone and make appointment with provider if in the yellow zone for 48 hours without improvement. 12-14-2020: The patient and his wife do well with calling office for changes in condition. The patient is stable at this time. Provided patient with education about the role of exercise in the management of COPD Advised patient to engage in light exercise as tolerated 3-5 days a week Provided education about and advised patient to utilize infection prevention strategies to reduce risk of respiratory infection. 12-14-2020: The patient is stable at this time. Knows what worse looks like. Will continue to monitor for changes.  Self-Care Activities:  Patient verbalizes understanding of plan to  effective management of COPD Self administers medications as prescribed Attends all scheduled provider appointments Calls pharmacy for medication refills Attends church or other social activities Performs ADL's independently Performs IADL's independently Calls provider office for new concerns or questions Patient Goals: - do breathing exercises at least 2 times each day - do exercises in a comfortable position that makes breathing as easy as possible - develop a new routine to improve sleep - don't eat or exercise right before bedtime - eat healthy - get at least 7 to 8 hours of sleep at night - get outdoors every day (weather permitting) - keep room cool and dark - limit daytime naps - practice relaxation or meditation daily - use a fan or white noise in bedroom - use devices that will help like a cane, sock-puller or reacher - develop a rescue plan - eliminate symptom triggers at home - follow rescue plan if symptoms flare-up - keep follow-up appointments - use an extra pillow to sleep - avoid second hand smoke - eliminate smoking in my home - identify and avoid work-related triggers - identify and remove indoor air pollutants - limit outdoor activity during cold weather - listen for public air quality announcements every day Follow Up Plan: Telephone follow up appointment with care management team member scheduled for: 01-22-2021 at 1030 am    Care Plan : Chronic Pain (Adult)  Updates made by Vanita Ingles, RN since 01/22/2021 12:00 AM  Completed 01/22/2021   Problem: Chronic Pain Management (Chronic Pain) Resolved 01/22/2021     Long-Range Goal: Chronic Pain Managed Completed 01/22/2021  Start Date: 12/14/2020  Expected End Date: 12/14/2021  Recent Progress: On track  Priority: High  Note:   Current Barriers: Resolving, duplicate goal Knowledge Deficits related to managing acute/chronic pain Non-adherence to scheduled provider appointments Non-adherence to prescribed  medication regimen Difficulty obtaining medications Chronic Disease Management support and education needs related to chronic pain Unable to independently manage chronic pain in bilateral shoulders Nurse Case Manager Clinical Goal(s):  patient will verbalize understanding of plan for managing pain patient will work with [place] to address pain relief measures and effective management of pain  patient will attend all scheduled medical appointments: 03-14-2021 at 2 pm patient will demonstrate use of different relaxation  skills and/or diversional activities to assist with pain reduction (distraction, imagery, relaxation, massage, acupressure, TENS, heat, and cold application patient will report pain at a level less than 3 to 4 on a 10-10 rating scale patient will use pharmacological and nonpharmacological pain relief strategies patient will verbalize acceptable level of pain relief and  ability to engage in desired activities patient will engage in desired activities without an increase in pain level Interventions:  Collaboration with Parks Ranger, [name], DO regarding development and update of comprehensive plan of care as evidenced by provider attestation and co-signature Inter-disciplinary care team collaboration (see longitudinal plan of care) - careful application of heat or ice encouraged - deep breathing, relaxation and mindfulness use promoted - effectiveness of pharmacologic therapy monitored - medication-induced side effects managed - misuse of pain medication assessed - motivation and barriers to change assessed and addressed - mutually acceptable comfort goal set - pain assessed - pain treatment goals reviewed - premedication prior to activity encouraged Evaluation of current treatment plan related to pain in bilateral shoulders and patient's adherence to plan as established by provider. 12-14-2020: The patient had a cortisone shot in his right shoulder on 12-11-2020. The  patients wife states the patient still can not raise his arm and states it is still hurting. The patients wife states the pcp may have to refer the patient to an orthopedic provider. The wife states the neurologist changed the gabapentin to 600 mg TID and this has been very helpful for the patient. Advised the patients wife to call the office on Monday if the patient had not had an improvement in his pain level.  Advised patient to call the office for changes in level or intensity of pain in his bilateral shoulders  Provided education to patient re: use of heat application. The patients wife states this has not been effective for him.  Reviewed medications with patient and discussed 12-14-2020: The patients wife endorses compliance with medications.  Discussed plans with patient for ongoing care management follow up and provided patient with direct contact information for care management team Allow patient to maintain a diary of pain ratings, timing, precipitating events, medications, treatments, and what works best to relieve pain,  Refer to support groups and self-help groups Educate patient about the use of pharmacological interventions for pain management- antianxiety, antidepressants, NSAIDS, opioid analgesics,  Explain the importance of lifestyle modifications to effective pain management  Patient Goals/Self Care Activities:   Self-administers medications as prescribed Attends all scheduled provider appointments Calls pharmacy for medication refills Calls provider office for new concerns or questions Follow Up Plan: Telephone follow up appointment with care management team member scheduled for: 01-22-2021 at 57 am      Care Plan : RNCM: General Plan of Care (Adult) for Chronic Disease Management and Care Cooerdination Needs  Updates made by Vanita Ingles, RN since 01/22/2021 12:00 AM     Problem: RNCM: Development of Plan of Care for Chronic Disease Management (HF, CAD, HTN,HLD, DM,  COPD, CKD3, Chronic Pain)   Priority: High     Long-Range Goal: RNCM: Effective Management  of Plan of Care for Chronic Disease Management (HF, CAD, HTN,HLD, DM, COPD, CKD3, Chronic Pain)   Start Date: 01/22/2021  Expected End Date: 01/22/2022  Priority: High  Note:   Current Barriers:  Knowledge Deficits related to plan of care for management of CHF, CAD, HTN, HLD, COPD, DMII, CKD Stage 3, and chronic pain  Care Coordination needs related to Level of care concerns and multiple chronic conditions  Chronic Disease Management support and education needs related to CHF, CAD, HTN, HLD, COPD, DMII, CKD Stage 3, and chronic pain   RNCM Clinical Goal(s):  Patient will verbalize understanding of plan for management of CHF, CAD, HTN, HLD, COPD, DMII, CKD Stage 3, and Osteoarthritis as evidenced by understanding  the plan of care, calling the provider for changes, and working with the CCM to optimize health and well being for effective management of chronic conditions take all medications exactly as prescribed and will call provider for medication related questions as evidenced by taking medications as prescribed, contacting providers for questions about medications, and working with the pharm D to manage polypharmacy and medications concerns     attend all scheduled medical appointments: 03-14-2021 at 2 pm as evidenced by by keeping appointments with pcp and other specialist and calling for new appointments needed or questions        demonstrate improved and ongoing adherence to prescribed treatment plan for CHF, CAD, HTN, HLD, COPD, DMII, CKD Stage 3, and Osteoarthritis as evidenced by working with the CCM team to optimize health and well being  demonstrate a decrease in CHF, CAD, HTN, HLD, COPD, DMII, CKD Stage 3, and Osteoarthritis exacerbations  as evidenced by calling the provider for changes in conditions, stable chronic conditions and working with the CCM team to effectively manage health and well  being  demonstrate ongoing self health care management ability for effective management of chronic conditions  as evidenced by  working with the CCM team  through collaboration with Consulting civil engineer, provider, and care team.   Interventions: 1:1 collaboration with primary care provider regarding development and update of comprehensive plan of care as evidenced by provider attestation and co-signature Inter-disciplinary care team collaboration (see longitudinal plan of care) Evaluation of current treatment plan related to  self management and patient's adherence to plan as established by provider   SDOH Barriers (Status: Goal on Track (progressing): YES.) Long Term Goal  Patient interviewed and SDOH assessment performed        Patient interviewed and appropriate assessments performed Provided patient with information about resources in [facility] and care guides available for assistance with changes in SDOH, questions or concerns Discussed plans with patient for ongoing care management follow up and provided patient with direct contact information for care management team Advised patient to call the office for changes in [place], new concerns or questions  Provided education to patient/caregiver regarding level of care options.    CAD  (Status: Goal on Track (progressing): YES.) Long Term Goal  Assessed understanding of CAD diagnosis Medications reviewed including medications utilized in CAD treatment plan Provided education on importance of blood pressure control in management of CAD; Provided education on Importance of limiting foods high in cholesterol; Counseled on importance of regular laboratory monitoring as prescribed; Reviewed Importance of taking all medications as prescribed Reviewed Importance of attending all scheduled provider appointments Advised to report any changes in symptoms or exercise tolerance Screening for signs and symptoms of depression related to chronic  disease state;  Assessed social determinant of health barriers;   Heart Failure Interventions:  (Status: Goal on Track (progressing): YES.)  Long Term Goal  Basic overview and discussion of pathophysiology of Heart Failure reviewed; Provided education on low sodium diet; Reviewed Heart Failure Action Plan in depth and provided written copy; Assessed need for readable accurate scales in home; Provided education about placing scale on hard, flat surface; Advised patient to weigh each morning after emptying bladder; Discussed importance of daily weight and advised patient to weigh and record daily; Reviewed role of diuretics in prevention of fluid overload and management of heart failure; Discussed the importance of keeping all appointments with provider; Provided patient with education about the role of exercise in the management of heart failure; Advised  patient to discuss torsemide dose  with provider.  01-22-2021: The patients wife states that she has not heard back from the heart failure clinic on the Torsemide dose. Advised the patients wife to call the HF clinic back and request clarification on Torsemide dose. The patients wife verbalized understanding. She states he is not having any issues with swelling but he is having a problem with "holding his water" and its been going on for a while.   Chronic Kidney Disease (Status: Goal on Track (progressing): YES.)  Long Term Goal  Last practice recorded BP readings:  BP Readings from Last 3 Encounters:  12/11/20 (!) 148/75  11/23/20 (!) 132/56  11/08/20 108/66  Most recent eGFR/CrCl:  Lab Results  Component Value Date   EGFR 44 (L) 06/29/2020    No components found for: CRCL  Assessed the patient   and patients wife for   understanding of chronic kidney disease    Evaluation of current treatment plan related to chronic kidney disease self management and patient's adherence to plan as established by provider . 01-22-2021: The patients wife  states the patient is having issues with "holding his water". States the urologist gave him a pill but it does not help. The patient is taking myrbetriq but the patients wife says he cannot tell where it helps him. Advised the patients wife to follow up with urologist, will also collaborate with the pcp for any other additional recommendations. Advised the patients wife with the multiple medications he takes and his chronic conditions that this is something that may be a little harder to manage.     Provided education to patient re: stroke prevention, s/s of heart attack and stroke    Reviewed prescribed diet heart healthy/ADA diet  Reviewed medications with patient and discussed importance of compliance    Advised patient, providing education and rationale, to monitor blood pressure daily and record, calling PCP for findings outside established parameters    Discussed complications of poorly controlled blood pressure such as heart disease, stroke, circulatory complications, vision complications, kidney impairment, sexual dysfunction    Advised patient to discuss urinary incontinence and frequency  with provider    Discussed plans with patient for ongoing care management follow up and provided patient with direct contact information for care management team    Discussed the impact of chronic kidney disease on daily life and mental health and acknowledged and normalized feelings of disempowerment, fear, and frustration    Engage patient in early, proactive and ongoing discussion about goals of care and what matters most to them      COPD: (Status: Goal on Track (progressing): YES.) Long Term Goal  Reviewed medications with patient, including use of prescribed maintenance and rescue inhalers, and provided instruction on medication management and the importance of adherence Provided patient with basic written and verbal COPD education on self care/management/and exacerbation prevention; Advised patient  to track and manage COPD triggers;  Provided written and verbal instructions on pursed lip breathing and utilized returned demonstration as teach back; Provided instruction about proper use of medications used for management of COPD including inhalers; Advised patient to self assesses COPD action plan zone and make appointment with provider if in the yellow zone for 48 hours without improvement; Advised patient to engage in light exercise as tolerated 3-5 days a week to aid in the the management of COPD; Provided education about and advised patient to utilize infection prevention strategies to reduce risk of respiratory infection; Discussed the importance of  adequate rest and management of fatigue with COPD;  Diabetes:  (Status: Goal on Track (progressing): YES.) Long Term Goal   Lab Results  Component Value Date   HGBA1C 6.2 (A) 10/16/2020  Assessed patient's understanding of A1c goal: <7% Provided education to patient about basic DM disease process; Reviewed medications with patient and discussed importance of medication adherence;        Reviewed prescribed diet with patient heart healthy/ADA; Counseled on importance of regular laboratory monitoring as prescribed;        Discussed plans with patient for ongoing care management follow up and provided patient with direct contact information for care management team;      Provided patient with written educational materials related to hypo and hyperglycemia and importance of correct treatment;       Reviewed scheduled/upcoming provider appointments including: 03-14-2021 at 2 pm;         Advised patient, providing education and rationale, to check cbg as directed  and record        call provider for findings outside established parameters;       Review of patient status, including review of consultants reports, relevant laboratory and other test results, and medications completed;        Hyperlipidemia:  (Status: Goal on Track (progressing):  YES.) Lab Results  Component Value Date   CHOL 123 06/29/2020   HDL 29 (L) 06/29/2020   LDLCALC 65 06/29/2020   TRIG 170 (H) 06/29/2020   CHOLHDL 4.2 06/29/2020     Medication review performed; medication list updated in electronic medical record.  Provider established cholesterol goals reviewed; Counseled on importance of regular laboratory monitoring as prescribed; Provided HLD educational materials; Reviewed role and benefits of statin for ASCVD risk reduction; Discussed strategies to manage statin-induced myalgias; Reviewed importance of limiting foods high in cholesterol; Reviewed exercise goals and target of 150 minutes per week;  Hypertension: (Status: Goal on Track (progressing): YES.) Last practice recorded BP readings:  BP Readings from Last 3 Encounters:  12/11/20 (!) 148/75  11/23/20 (!) 132/56  11/08/20 108/66  Most recent eGFR/CrCl:  Lab Results  Component Value Date   EGFR 44 (L) 06/29/2020    No components found for: CRCL  Evaluation of current treatment plan related to hypertension self management and patient's adherence to plan as established by provider;   Provided education to patient re: stroke prevention, s/s of heart attack and stroke; Reviewed prescribed diet heart healthy/ADA Reviewed medications with patient and discussed importance of compliance;  Discussed plans with patient for ongoing care management follow up and provided patient with direct contact information for care management team; Advised patient, providing education and rationale, to monitor blood pressure daily and record, calling PCP for findings outside established parameters;  Advised patient to discuss blood pressure trends  with provider; Provided education on prescribed diet heart healthy/ADA diet ;  Discussed complications of poorly controlled blood pressure such as heart disease, stroke, circulatory complications, vision complications, kidney impairment, sexual dysfunction;    Pain:  (Status: Goal on track: NO.) Long Term Goal  Pain assessment performed. 03-24-2020: the patient rates his pain in his bilateral shoulders at an 8 today on a scale of 0-10.  The patient states that he cannot raise his arms but so high. The patient is not taking any medications for pain relief. Has not tried heat or cold applications. The patient states the injections he gets at the provider office usually help but they have not this time. The patient states  that he is open to recommendations.  Medications reviewed. 01-22-2021: The patient is currently not taking any medications for pain relief. Does have gabapentin that he takes on a consistent basis for neuropathy pain. Dosage in question concerning this as the neurologist has increased dose to 800 mg and renal dose recommends dose not to exceed 600 mg BID. Education with wife concerning medication changes.  Reviewed provider established plan for pain management. 01-22-2021: The patient and the patients wife ask if physical therapy would help with shoulder pain and discomfort. Will collaborate with the pcp on recommendations and let the patient and the patients wife know.  Discussed importance of adherence to all scheduled medical appointments; Counseled on the importance of reporting any/all new or changed pain symptoms or management strategies to pain management provider; Advised patient to report to care team affect of pain on daily activities; Discussed use of relaxation techniques and/or diversional activities to assist with pain reduction (distraction, imagery, relaxation, massage, acupressure, TENS, heat, and cold application; Reviewed with patient prescribed pharmacological and nonpharmacological pain relief strategies; Advised patient to discuss unresolved pain, changes in level and intensity of pain with provider;  Patient Goals/Self-Care Activities: Patient will self administer medications as prescribed as evidenced by self  report/primary caregiver report  Patient will attend all scheduled provider appointments as evidenced by clinician review of documented attendance to scheduled appointments and patient/caregiver report Patient will call pharmacy for medication refills as evidenced by patient report and review of pharmacy fill history as appropriate Patient will attend church or other social activities as evidenced by patient report Patient will continue to perform ADL's independently as evidenced by patient/caregiver report Patient will continue to perform IADL's independently as evidenced by patient/caregiver report Patient will call provider office for new concerns or questions as evidenced by review of documented incoming telephone call notes and patient report Patient will work with BSW to address care coordination needs and will continue to work with the clinical team to address health care and disease management related needs as evidenced by documented adherence to scheduled care management/care coordination appointments - call office if I gain more than 2 pounds in one day or 5 pounds in one week - keep legs up while sitting - track weight in diary - use salt in moderation - watch for swelling in feet, ankles and legs every day - weigh myself daily - develop a rescue plan - follow rescue plan if symptoms flare-up - know when to call the doctor:for changes in HF sx and sx - track symptoms and what helps feel better or worse - dress right for the weather, hot or cold - schedule appointment with eye doctor - check blood sugar at prescribed times: before meals and at bedtime, when you have symptoms of low or high blood sugar, and before and after exercise - check feet daily for cuts, sores or redness - enter blood sugar readings and medication or insulin into daily log - take the blood sugar log to all doctor visits - trim toenails straight across - drink 6 to 8 glasses of water each day - eat fish at  least once per week - fill half of plate with vegetables - limit fast food meals to no more than 1 per week - manage portion size - prepare main meal at home 3 to 5 days each week - read food labels for fat, fiber, carbohydrates and portion size - reduce red meat to 2 to 3 times a week - keep feet up  while sitting - wash and dry feet carefully every day - wear comfortable, cotton socks - wear comfortable, well-fitting shoes Keep appointments with the podiatrist, next appointment on 01-24-2021 - avoid second hand smoke - eliminate smoking in my home - identify and remove indoor air pollutants - limit outdoor activity during cold weather - listen for public air quality announcements every day - do breathing exercises every day - develop a rescue plan - eliminate symptom triggers at home - follow rescue plan if symptoms flare-up - use an extra pillow to sleep - develop a new routine to improve sleep - don't eat or exercise right before bedtime - eat healthy/prescribed diet: heart healthy/ADA diet  - get at least 7 to 8 hours of sleep at night - use devices that will help like a cane, sock-puller or reacher - practice relaxation or meditation daily - do exercises in a comfortable position that makes breathing as easy as possible - check blood pressure 3 times per week - choose a place to take my blood pressure (home, clinic or office, retail store) - write blood pressure results in a log or diary - learn about high blood pressure - keep a blood pressure log - take blood pressure log to all doctor appointments - call doctor for signs and symptoms of high blood pressure - develop an action plan for high blood pressure - keep all doctor appointments - take medications for blood pressure exactly as prescribed - report new symptoms to your doctor - eat more whole grains, fruits and vegetables, lean meats and healthy fats - call for medicine refill 2 or 3 days before it runs out - take  all medications exactly as prescribed - call doctor with any symptoms you believe are related to your medicine - call doctor when you experience any new symptoms - go to all doctor appointments as scheduled - adhere to prescribed diet: heart healthy/ADA diet        Plan:Telephone follow up appointment with care management team member scheduled for:  03-26-2021 at [place] am  Noreene Larsson RN, MSN, [place] Mobile: [phone]

## 2021-01-22 NOTE — Telephone Encounter (Signed)
Spoke with pts wife and discussed how diuretics work and confirmed dose. Wife thanked me for the call.

## 2021-01-22 NOTE — Chronic Care Management (AMB) (Signed)
Chronic Care Management Pharmacy Note  01/22/2021 Name:  Lucas Caldwell MRN:  [id number] DOB:  04/05/1933   Subjective: Lucas Caldwell is an 87 y.o. year old male who is a primary patient of [name], DO.  The CCM team was consulted for assistance with disease management and care coordination needs.    Receive a message from CCM Nurse Case Manager requesting outreach to patient/spouse.  Engaged with patient by telephone for follow up visit in response to provider referral for pharmacy case management and/or care coordination services.   Consent to Services:  The patient was given information about Chronic Care Management services, agreed to services, and gave verbal consent prior to initiation of services.  Please see initial visit note for detailed documentation.   Patient Care Team: [name], DO as PCP - General (Family Medicine) End, [name], MD as PCP - Cardiology (Cardiology) Curley Spice, [name], RPH-CPP as Pharmacist [name] [name], RN as Case Manager (General Practice) Tat, Eustace Quail, DO as Consulting Physician (Neurology)   Objective:  Lab Results  Component Value Date   CREATININE 1.73 (H) 08/16/2020   CREATININE 1.55 (H) 06/29/2020   CREATININE 1.72 (H) 04/13/2020    Lab Results  Component Value Date   HGBA1C 6.2 (A) 10/16/2020   Last diabetic Eye exam:  Lab Results  Component Value Date/Time   HMDIABEYEEXA Retinopathy (A) 12/07/2019 12:00 AM    Last diabetic Foot exam: No results found for: HMDIABFOOTEX   Social History   Tobacco Use  Smoking Status Former   Packs/day: 1.75   Years: 20.00   Pack years: 35.00   Types: Cigarettes   Start date: 81   Quit date: 1975   Years since quitting: 47.8  Smokeless Tobacco Former   BP Readings from Last 3 Encounters:  12/11/20 (!) 148/75  11/23/20 (!) 132/56  11/08/20 108/66   Pulse Readings from Last 3 Encounters:  12/11/20 79  11/23/20 76  11/08/20 84    Wt Readings from Last 3 Encounters:  12/11/20 194 lb 3.2 oz (88.1 kg)  11/23/20 192 lb 6.4 oz (87.3 kg)  11/08/20 196 lb (88.9 kg)    Assessment: Review of patient past medical history, allergies, medications, health status, including review of consultants reports, laboratory and other test data, was performed as part of comprehensive evaluation and provision of chronic care management services.   SDOH:  (Social Determinants of Health) assessments and interventions performed:    CCM Care Plan  Allergies  Allergen Reactions   Clopidogrel Anaphylaxis and Other (See Comments)    altered mental status;  (electrolytes "out of wack" and confusion per family; THEY DO NOT REMEMBER ANY OTHER REACTION)- MSB 10/10/15   Ciprofloxacin Itching   Mirtazapine Diarrhea    Bad dreams    Spironolactone Other (See Comments)    gynecomastia    Medications Reviewed Today     Reviewed by Rennis Petty, RPH-CPP (Pharmacist) on 01/22/21 at [place] List Status: <None>   Medication Order Taking? Sig Documenting Provider Last Dose Status Informant  acetaminophen (TYLENOL) 500 MG tablet [id number]  Take 1,000 mg by mouth every 6 (six) hours as needed for moderate pain or headache.  [provider]  Active Spouse/Significant Other  Alcohol Swabs (B-D SINGLE USE SWABS REGULAR) PADS [id number]  Use to check blood sugar up to 2 times daily [name], DO  Active   allopurinol (ZYLOPRIM) 100 MG tablet [id number]  TAKE ONE TABLET BY MOUTH ONCE  DAILY Bensimhon, Shaune Pascal, MD  Active   aspirin 81 MG chewable tablet [id number]  Chew 81 mg by mouth daily. [provider]  Active Spouse/Significant Other  Blood Glucose Monitoring Suppl (TRUE METRIX METER) DEVI [id number]  Use to check blood sugar up to 2 times daily [name], DO  Active   bosutinib (BOSULIF) 100 MG tablet [id number]  Take 100 mg by mouth at bedtime. Take with food. [provider]  Active             Med Note Kenton Kingfisher, Germaine Pomfret Sep 15, 2019  8:38 AM)    Cholecalciferol 25 MCG (1000 UT) capsule [id number]  Take 1,000 Units by mouth daily. [provider]  Active   empagliflozin (JARDIANCE) 10 MG TABS tablet [id number] Yes Take 1 tablet (10 mg total) by mouth daily with breakfast. Please make an appointment for further refills Clegg, Amy D, NP Taking Active   ferrous sulfate 325 (65 FE) MG tablet [id number]  Take by mouth. [provider]  Active   gabapentin (NEURONTIN) 300 MG capsule [id number] Yes Take 1 capsule by mouth 3 (three) times daily. [provider]  Active   Insulin Glargine ([name] KWIKPEN) 100 UNIT/ML [id number]  Inject 18 Units into the skin daily. [provider]  Active   Insulin Pen Needle 31G X 5 MM MISC [id number]  31 g by Does not apply route as directed. [name], NP  Active Spouse/Significant Other  isosorbide mononitrate (IMDUR) 60 MG 24 hr tablet [id number]  TAKE ONE TABLET BY MOUTH ONCE DAILY End, [name], MD  Active   levothyroxine (SYNTHROID) 25 MCG tablet [id number]  TAKE ONE TABLET BY MOUTH EVERY MORNING BEFORE BREAKFAST Karamalegos, [name], DO  Active   losartan (COZAAR) 25 MG tablet [id number]  Take 0.5 tablets (12.5 mg total) by mouth daily. Theora Gianotti, NP  Active   Menthol-Zinc Oxide (GOLD BOND EX) [id number]  Apply 1 application topically daily as needed (muscle pain). [provider]  Active   mirabegron ER (MYRBETRIQ) 25 MG TB24 tablet [id number]  Take 1 tablet (25 mg total) by mouth daily. From Urology Debroah Loop, PA-C  Active   Multiple Vitamin (MULTIVITAMIN WITH MINERALS) TABS tablet [id number]  Take 1 tablet by mouth daily. [provider]  Active Spouse/Significant Other  nitroGLYCERIN (NITROSTAT) 0.4 MG SL tablet [id number]  Place 1 tablet (0.4 mg total) under the tongue every 5 (five) minutes x 3 doses as needed for chest pain. Theora Gianotti, NP  Active   omeprazole (PRILOSEC) 20 MG capsule [id number]  Take 20 mg by mouth in the morning.  [provider]  Active Spouse/Significant Other  Polyethyl Glycol-Propyl Glycol (SYSTANE OP) [id number]  Place 1 drop into both eyes 3 (three) times daily as needed (dry/irritated eyes.).  [provider]  Active Spouse/Significant Other  polyethylene glycol (MIRALAX / GLYCOLAX) 17 g packet [id number]  Take 17 g by mouth daily as needed for mild constipation.  [provider]  Active Spouse/Significant Other  potassium chloride (KLOR-CON) 10 MEQ tablet [id number]  Take 1 tablet (10 mEq total) by mouth daily. Karamalegos, [name], DO  Active   ranolazine (RANEXA) 500 MG 12 hr tablet [id number]  TAKE ONE TABLET BY MOUTH TWICE DAILY End, [name], MD  Active   rosuvastatin (CRESTOR) 5 MG tablet [id number]  TAKE ONE TABLET BY MOUTH ONCE DAILY End, [name], MD  Active   tamsulosin (FLOMAX) 0.4 MG CAPS capsule [id number]  Take 1 capsule (0.4 mg total) by mouth daily after supper. Rise Mu, PA-C  Active   torsemide (DEMADEX) 20 MG tablet [id number] Yes Take 3 tablets (60 mg total) by mouth daily. Bensimhon, Shaune Pascal, MD Taking Active            Med Note Bretta Bang Jan 22, 2021  3:18 PM)    traMADol (ULTRAM) 50 MG tablet [id number]  TAKE 1 OR 2 TABLETS BY MOUTH AT BEDTIME AS NEEDED [name], DO  Active   TRULICITY 1.5 ZJ/6.9CV SOPN [id number]  Inject 1.5 mg into the skin once a week. [name], DO  Active            Med Note (NEWCOMER [place], BRANDY L   Thu Jun 29, 2020  1:45 PM)    warfarin (COUMADIN) 3 MG tablet [id number]  TAKE 1 TO 1 & 1/2 TABLETS BY MOUTH AS DIRECTED BY THE COUMADIN CLINIC End, Christopher, MD  Active             Patient Active Problem List   Diagnosis Date Noted   Diabetic polyneuropathy associated with type 2 diabetes mellitus ([place]) 11/23/2020   Orthostatic hypotension 10/16/2020    Obstructive sleep apnea 06/29/2020   Urge incontinence of urine 12/22/2019   Trifascicular block 10/20/2019   Wound infection 10/08/2019   Bifascicular block 05/13/2019   Coronary stent patent 04/13/2019   Chronic HFrEF (heart failure with reduced ejection fraction) ([place]) 03/19/2019   PSVT (paroxysmal supraventricular tachycardia) ([place]) 03/19/2019   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 ([place]) 08/24/2018   CKD (chronic kidney disease), stage IV ([place]) 06/04/2018   Osteoarthritis of knees, bilateral 06/02/2018   Hyperlipidemia associated with type 2 diabetes mellitus ([place]) 05/06/2018   Pain of lower extremity 03/06/2018   Duodenitis 01/30/2018   Chronic heart failure with preserved ejection fraction (HFpEF) ([place]) 01/23/2018   Hematochezia    Melena    Rectal bleeding 01/11/2018   AVM (arteriovenous malformation) of colon without hemorrhage    Schatzki's ring    Esophageal dysphagia    Diabetic retinopathy ([place]) 07/08/2017   Ischemic cardiomyopathy 06/26/2017   Cardiomyopathy ([place]) 06/26/2017   Centrilobular emphysema ([place]) 06/13/2017   Fatigue 12/18/2016   Stable angina ([place]) 09/20/2016   Stage 3a chronic kidney disease ([place]) 09/20/2016   Paroxysmal atrial fibrillation ([place]) 08/28/2016   Chronic anticoagulation 08/28/2016   Essential hypertension 08/20/2016   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 heart with stable angina pectoris ([place]) 08/19/2016   Accelerating angina ([place]) 08/15/2016   Exertional dyspnea 08/12/2016   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[place]) 12/29/2015   Gynecomastia, male 03/31/2014   Hypertrophy of breast 03/31/2014   Nuclear sclerosis of left eye 10/22/2013   Enthesopathy of ankle and tarsus 04/10/2013   Controlled type 2 diabetes mellitus with diabetic nephropathy, with long-term current use of insulin ([place]) 04/09/2013   Diabetic neuropathy associated with type 2 diabetes mellitus ([place]) 04/09/2013   Cataract of left eye 11/25/2011   Corneal  opacity 09/30/2011   Pseudophakia of right eye 09/30/2011   GERD (gastroesophageal reflux disease) 09/25/2011   Gout 09/25/2011   Hyperlipidemia LDL goal <70 09/25/2011   Status post cataract extraction 04/11/2011   Cataract extraction status of eye 04/11/2011   Iron deficiency anemia 04/01/2011   Benign localized hyperplasia of prostate without urinary obstruction and other lower urinary tract symptoms (LUTS) 12/10/2010   Enlarged prostate without lower urinary tract symptoms (luts) 12/10/2010  Colon cancer screening 10/11/2010   Hypothyroidism 10/11/2010   Hyperlipidemia 09/20/2003   Essential and other specified forms of tremor 01/05/2001   Essential tremor 01/05/2001    Immunization History  Administered Date(s) Administered   Fluad Quad(high Dose 65+) 12/11/2020   Influenza, High Dose Seasonal PF 12/29/2014, 12/19/2016, 12/22/2017   Influenza, Seasonal, Injecte, Preservative Fre 01/13/2007, 12/13/2008, 01/30/2010, 12/26/2011   Influenza,inj,Quad PF,6+ Mos 12/25/2012, 12/09/2015, 11/26/2018, 12/15/2019   PFIZER(Purple Top)SARS-COV-2 Vaccination 05/13/2019, 06/08/2019, 12/15/2019, 05/19/2020   PPD Test 09/25/2010   Pneumococcal Conjugate-13 01/31/2014   Pneumococcal Polysaccharide-23 06/20/2008, 09/02/2010    Conditions to be addressed/monitored: T2DM, CKD, HF, HLD, atrial fibrillation, OSA, HTN, hypothyroidism   Care Plan : PharmD - Medication Management/Assistance  Updates made by Rennis Petty, RPH-CPP since 01/22/2021 12:00 AM     Problem: Disease Progression      Long-Range Goal: Disease Progression Prevented or Minimized   Start Date: 04/14/2020  Expected End Date: 07/13/2020  This Visit's Progress: On track  Recent Progress: On track  Priority: High  Note:   Current Barriers:  Chronic Disease Management support, education, and care coordination needs related to type 2 diabetes, CKD, HFrEF s/p CardioMEMS, hyperlipidemia, atrial fibrillation, hypertension,  coronary artery disease, CHF, hypothyroidism, and CML Financial Note patient has Partial Extra Help/LIS Subsidy Patient enrolled in [employer] health practitioner and [employer]) patient assistance program for 2022 calendar year Patient denied for patient assistance for [place] from [employer] for 2022 calendar year Patient enrolled in [place] for [place] copayment assistance (eligibility through 08/28/2021) Limited vision  Pharmacist Clinical Goal(s):  Over the next 90 days, patient will verbalize ability to afford treatment regimen through collaboration with PharmD and provider.   Interventions: 1:1 collaboration with [name], DO regarding development and update of comprehensive plan of care as evidenced by provider attestation and co-signature Inter-disciplinary care team collaboration (see longitudinal plan of care) Receive coordination of care message from CCM Nurse Case Manager  Medication Management: Again place call to [facility] regarding renal dosing for gabapentin Note on 11/1 Neurologist advised patient to increase gabapentin dose to 800 mg three times daily Note patient has chronic dizziness Based on latest creatine lab value of 1.54 mg/dL on 11/24/2020 (per shared record from [facility]), calculated CrCl (using adjusted body weight) of ~35 ml/min  Gabapentin package labeling recommends for CrCl of 30-59 mL/min, maximum dose of 1400 mg/day Receive a call back from [facility] with [facility]. States Dr. Melrose Nakayama reviewed patient's latest renal function and will now reduce his gabapentin dose to 300 mg - 1 capsule three time daily Loma Sousa confirms new Rx will be sent to patient's pharmacy and will contact patient to provide this update Last week identified patient was taking torsemide 20 mg - 2 tablets (40 mg) QAM (rather than 3 tablets (60 mg) daily as on medication list) From review of  chart, note [facility] tried to reach patient on 10/20 and 10/21 regarding Cardiomems and torsemide dose.  Contacted [facility] and left message requesting call back to patient/spouse From review of chart, note [facility] had attempted to reach patient, but without success Today speak with spouse, who reports patient currently taking torsemide 20 mg - 3 tablets (60 mg) QAM, but concerned about how frequently patient using the bathroom.  Place call to [facility] again today Follow up this afternoon and patient's wife reports spoke with clinic and confirmed patient to continue  current dose of torsemide Spouse reports patient remains adherent to scans required for CardioMEMS monitoring  Coordination of Care: [name] reports patient needs assistance with transportation to appointment with [facility] in [place] on 11/17 Will send referral to Care Guide team for assistance with transportation  Medication Assistance: Patient interested in re-enrollment in [place] patient assistance program for [employer] and [employer] for 6599 calendar year Collaborating with [place] for aid to patient/spouse with re-enrollment application  Patient Goals/Self-Care Activities Over the next 90 days, patient will:  Check blood sugar, blood pressure and fluid status as directed by providers  Take medications, with assistance of wife, as directed Mrs. Toomey manages patient's medications using weekly pillbox as adherence tool Attends scheduled medical appointments  Follow Up Plan: Telephone follow up appointment with care management team member scheduled for: 02/05/2021 at 10:15 AM      Patient's preferred pharmacy is:  [facility], [place], [place] [address] Phone: [phone] Fax: [place], [place] [address] [place] [address] Phone: [phone] Fax: [phone]  [facility] - [place], [place] [address] Phone: [phone] Fax: [phone]  [facility], [place] - [address] [place] [address] Phone: [phone] Fax: [phone]   Follow Up:  Patient agrees to Care Plan and Follow-up.  [name], PharmD, Para March, CPP Clinical Pharmacist [facility] [phone]

## 2021-01-22 NOTE — Patient Instructions (Signed)
Visit Information  Please check your blood sugar, keep a log of the results and bring this with you to your medical appointments.  Please check your home blood pressure, keep a log of the results and bring this with you to your medical appointments.  Our goal bad cholesterol, or LDL, is less than 70 . This is why it is important to continue taking your rosuvastatin.  Feel free to call me with any questions or concerns. I look forward to our next call!  [name], PharmD, [place] [phone]  The patient verbalized understanding of instructions, educational materials, and care plan provided today and declined offer to receive copy of patient instructions, educational materials, and care plan.   Telephone follow up appointment with care management team member scheduled for: 02/05/2021 at 10:15 AM

## 2021-01-22 NOTE — Patient Instructions (Signed)
Visit Information  The patient verbalized understanding of instructions, educational materials, and care plan provided today and declined offer to receive copy of patient instructions, educational materials, and care plan.   Telephone follow up appointment with care management team member scheduled for: 03-26-2021 at [place] am  Noreene Larsson RN, MSN, [place] Mobile: [phone]

## 2021-01-23 ENCOUNTER — Telehealth (HOSPITAL_COMMUNITY): Payer: Self-pay | Admitting: Adult Health

## 2021-01-23 ENCOUNTER — Telehealth: Payer: Self-pay

## 2021-01-23 NOTE — Telephone Encounter (Signed)
I called- no answer.   Cardiomems Reading Goal 15  Todays reading is 21 suggestive of fluid accumulation.    Needs to take extra 40 mg torsemide x2 days.   Darshan Solanki NP-C  2:12 PM

## 2021-01-23 NOTE — Telephone Encounter (Signed)
   Telephone encounter was:  Successful.  01/23/2021 Name: Lucas Caldwell MRN: [id number] DOB: 1933/10/27  Lucas Caldwell is a 87 y.o. year old male who is a primary care patient of [name], DO . The community resource team was consulted for assistance with Transportation Needs   Care guide performed the following interventions:  Spouse advised to contact her back on her mobile number. She also stated she would be traveling with pt. Spouse advised pt is ambulatory with cane. Spouse has been educationed on [place]. I will set the first trip at this time to ensure pt is enrolled.  Follow Up Plan:  Care guide will follow up with patient by phone over the next few days.  [place] management  [place], [place]  Main Phone: [phone]  E-mail: [email or web address]  Website: [email or web address]

## 2021-01-23 NOTE — Telephone Encounter (Signed)
  Care Management   Follow Up Note   01/23/2021 Name: Lucas Caldwell MRN: [id number] DOB: 1933-07-07   Referred by: [name], DO Reason for referral : Chronic Care Management (RNCM: Follow up on ortho appointment for 02-14-2021 at 10 am)   Call made to [facility] at [phone] to see about referral information, The patient has an appointment on 02-14-2021 at 10 am. Attempted to call the patient x 2. Will follow up with the patient for review.   Follow Up Plan: The care management team will reach out to the patient again over the next 2 to 10 days.    Noreene Larsson RN, MSN, [place] Mobile: [phone]

## 2021-01-24 ENCOUNTER — Telehealth (HOSPITAL_COMMUNITY): Payer: Self-pay | Admitting: Licensed Clinical Social Worker

## 2021-01-24 ENCOUNTER — Telehealth: Payer: Self-pay

## 2021-01-24 ENCOUNTER — Ambulatory Visit: Payer: Self-pay

## 2021-01-24 ENCOUNTER — Telehealth: Payer: Self-pay | Admitting: Pharmacy Technician

## 2021-01-24 DIAGNOSIS — G8929 Other chronic pain: Secondary | ICD-10-CM

## 2021-01-24 DIAGNOSIS — I10 Essential (primary) hypertension: Secondary | ICD-10-CM

## 2021-01-24 DIAGNOSIS — B351 Tinea unguium: Secondary | ICD-10-CM | POA: Diagnosis not present

## 2021-01-24 DIAGNOSIS — I5032 Chronic diastolic (congestive) heart failure: Secondary | ICD-10-CM

## 2021-01-24 DIAGNOSIS — E1142 Type 2 diabetes mellitus with diabetic polyneuropathy: Secondary | ICD-10-CM | POA: Diagnosis not present

## 2021-01-24 DIAGNOSIS — Z596 Low income: Secondary | ICD-10-CM

## 2021-01-24 NOTE — Telephone Encounter (Signed)
   Telephone encounter was:  Unsuccessful.  01/24/2021 Name: CHIVAS NOTZ MRN: [id number] DOB: 1933/04/08  Unsuccessful outbound call made today to assist with:  Transportation Needs   Outreach Attempt:  1st Attempt  A HIPAA compliant voice message was left requesting a return call.  Instructed patient to call back at [phone] at their earliest convenience. - Left voicemail for spouse to return my call. I have arranged pt's transportation with [facility] for 11/16 and 11/17. 11/30 is too early to schedule as they start taking request up to 2 weeks.   [place] 01/31/2021 pick up 3:00pm-3:15pm  8:50a-9:05am pick up 02/01/2021 [place] [id number]   Ja'Mesha Lylee Corrow Care Guide, Embedded Care Coordination [place]  Care management  [place], [place]  Main Phone: [phone]  E-mail: [email or web address]  Website: [email or web address]

## 2021-01-24 NOTE — Telephone Encounter (Signed)
CSW consulted to reach out to pt and pt wife regarding transportation barriers to attending appts in [place].  Pt no longer drives- was getting rides from his daughter but she has moved far away and cannot assist per pt wife.  CSW discussed [facility] [employer] with pt wife- she states pt can get in and out of the car independently so should not have an issue using uber service.  CSW sent in referral to [facility] and requesting they reach out to help them schedule ride for appt next week.  Encouraged pt wife to call [facility] Medicare to discuss if they have transportation benefit for future appts that they cannot get ride to.  Will continue to follow and assist as needed  Jorge Ny, [facility] Desk#: [phone] Cell#: [phone]

## 2021-01-24 NOTE — Progress Notes (Signed)
[facility])                                            [place] Team    01/24/2021  Lucas Caldwell Apr 19, 1933 [id number]  FOR 2023 RE ENROLLMENT                                      Medication Assistance Referral  Referral From: [place]: Danelle Berry / Ralph Leyden Patient application portion:  Mailed Provider application portion: Faxed  to Dr. Parks Ranger Provider address/fax verified via: Office website  Medication/Company: Nancee Liter / Ralph Leyden Patient application portion:  Mailed Provider application portion: Faxed  to Dr. Parks Ranger Provider address/fax verified via: Office website    [name], [place]  [phone]

## 2021-01-24 NOTE — Chronic Care Management (AMB) (Signed)
Chronic Care Management   CCM RN Visit Note  01/24/2021 Name: RAMAR NOBREGA MRN: [id number] DOB: 10/01/1933  Subjective: Lucas Caldwell is a 87 y.o. year old male who is a primary care patient of [name], DO. The care management team was consulted for assistance with disease management and care coordination needs.    Engaged with patient by telephone for follow up visit in response to provider referral for case management and/or care coordination services. Spoke to the patients wife, [place].   Consent to Services:  The patient was given information about Chronic Care Management services, agreed to services, and gave verbal consent prior to initiation of services.  Please see initial visit note for detailed documentation.   Patient agreed to services and verbal consent obtained.   Assessment: Review of patient past medical history, allergies, medications, health status, including review of consultants reports, laboratory and other test data, was performed as part of comprehensive evaluation and provision of chronic care management services.   SDOH (Social Determinants of Health) assessments and interventions performed:    CCM Care Plan  Allergies  Allergen Reactions   Clopidogrel Anaphylaxis and Other (See Comments)    altered mental status;  (electrolytes "out of wack" and confusion per family; THEY DO NOT REMEMBER ANY OTHER REACTION)- MSB 10/10/15   Ciprofloxacin Itching   Mirtazapine Diarrhea    Bad dreams    Spironolactone Other (See Comments)    gynecomastia    Outpatient Encounter Medications as of 01/24/2021  Medication Sig   acetaminophen (TYLENOL) 500 MG tablet Take 1,000 mg by mouth every 6 (six) hours as needed for moderate pain or headache.    Alcohol Swabs (B-D SINGLE USE SWABS REGULAR) PADS Use to check blood sugar up to 2 times daily   allopurinol (ZYLOPRIM) 100 MG tablet TAKE ONE TABLET BY MOUTH ONCE DAILY   aspirin 81 MG chewable tablet Chew  81 mg by mouth daily.   Blood Glucose Monitoring Suppl (TRUE METRIX METER) DEVI Use to check blood sugar up to 2 times daily   bosutinib (BOSULIF) 100 MG tablet Take 100 mg by mouth at bedtime. Take with food.   Cholecalciferol 25 MCG (1000 UT) capsule Take 1,000 Units by mouth daily.   empagliflozin (JARDIANCE) 10 MG TABS tablet Take 1 tablet (10 mg total) by mouth daily with breakfast. Please make an appointment for further refills   ferrous sulfate 325 (65 FE) MG tablet Take by mouth.   gabapentin (NEURONTIN) 300 MG capsule Take 1 capsule by mouth 3 (three) times daily.   Insulin Glargine ([name] KWIKPEN) 100 UNIT/ML Inject 18 Units into the skin daily.   Insulin Pen Needle 31G X 5 MM MISC 31 g by Does not apply route as directed.   isosorbide mononitrate (IMDUR) 60 MG 24 hr tablet TAKE ONE TABLET BY MOUTH ONCE DAILY   levothyroxine (SYNTHROID) 25 MCG tablet TAKE ONE TABLET BY MOUTH EVERY MORNING BEFORE BREAKFAST   losartan (COZAAR) 25 MG tablet Take 0.5 tablets (12.5 mg total) by mouth daily.   Menthol-Zinc Oxide (GOLD BOND EX) Apply 1 application topically daily as needed (muscle pain).   mirabegron ER (MYRBETRIQ) 25 MG TB24 tablet Take 1 tablet (25 mg total) by mouth daily. From Urology   Multiple Vitamin (MULTIVITAMIN WITH MINERALS) TABS tablet Take 1 tablet by mouth daily.   nitroGLYCERIN (NITROSTAT) 0.4 MG SL tablet Place 1 tablet (0.4 mg total) under the tongue every 5 (five) minutes x 3 doses as needed for chest  pain.   omeprazole (PRILOSEC) 20 MG capsule Take 20 mg by mouth in the morning.    Polyethyl Glycol-Propyl Glycol (SYSTANE OP) Place 1 drop into both eyes 3 (three) times daily as needed (dry/irritated eyes.).    polyethylene glycol (MIRALAX / GLYCOLAX) 17 g packet Take 17 g by mouth daily as needed for mild constipation.    potassium chloride (KLOR-CON) 10 MEQ tablet Take 1 tablet (10 mEq total) by mouth daily.   ranolazine (RANEXA) 500 MG 12 hr tablet TAKE ONE TABLET BY  MOUTH TWICE DAILY   rosuvastatin (CRESTOR) 5 MG tablet TAKE ONE TABLET BY MOUTH ONCE DAILY   tamsulosin (FLOMAX) 0.4 MG CAPS capsule Take 1 capsule (0.4 mg total) by mouth daily after supper.   torsemide (DEMADEX) 20 MG tablet Take 3 tablets (60 mg total) by mouth daily.   traMADol (ULTRAM) 50 MG tablet TAKE 1 OR 2 TABLETS BY MOUTH AT BEDTIME AS NEEDED   TRULICITY 1.5 BO/1.7PZ SOPN Inject 1.5 mg into the skin once a week.   warfarin (COUMADIN) 3 MG tablet TAKE 1 TO 1 & 1/2 TABLETS BY MOUTH AS DIRECTED BY THE COUMADIN CLINIC   No facility-administered encounter medications on file as of 01/24/2021.    Patient Active Problem List   Diagnosis Date Noted   Diabetic polyneuropathy associated with type 2 diabetes mellitus ([place]) 11/23/2020   Orthostatic hypotension 10/16/2020   Obstructive sleep apnea 06/29/2020   Urge incontinence of urine 12/22/2019   Trifascicular block 10/20/2019   Wound infection 10/08/2019   Bifascicular block 05/13/2019   Coronary stent patent 04/13/2019   Chronic HFrEF (heart failure with reduced ejection fraction) ([place]) 03/19/2019   PSVT (paroxysmal supraventricular tachycardia) ([place]) 03/19/2019   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 ([place]) 08/24/2018   CKD (chronic kidney disease), stage IV ([place]) 06/04/2018   Osteoarthritis of knees, bilateral 06/02/2018   Hyperlipidemia associated with type 2 diabetes mellitus ([place]) 05/06/2018   Pain of lower extremity 03/06/2018   Duodenitis 01/30/2018   Chronic heart failure with preserved ejection fraction (HFpEF) ([place]) 01/23/2018   Hematochezia    Melena    Rectal bleeding 01/11/2018   AVM (arteriovenous malformation) of colon without hemorrhage    Schatzki's ring    Esophageal dysphagia    Diabetic retinopathy ([place]) 07/08/2017   Ischemic cardiomyopathy 06/26/2017   Cardiomyopathy ([place]) 06/26/2017   Centrilobular emphysema ([place]) 06/13/2017   Fatigue 12/18/2016   Stable angina ([place]) 09/20/2016   Stage 3a  chronic kidney disease ([place]) 09/20/2016   Paroxysmal atrial fibrillation ([place]) 08/28/2016   Chronic anticoagulation 08/28/2016   Essential hypertension 08/20/2016   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 heart with stable angina pectoris ([place]) 08/19/2016   Accelerating angina ([place]) 08/15/2016   Exertional dyspnea 08/12/2016   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[place]) 12/29/2015   Gynecomastia, male 03/31/2014   Hypertrophy of breast 03/31/2014   Nuclear sclerosis of left eye 10/22/2013   Enthesopathy of ankle and tarsus 04/10/2013   Controlled type 2 diabetes mellitus with diabetic nephropathy, with long-term current use of insulin ([place]) 04/09/2013   Diabetic neuropathy associated with type 2 diabetes mellitus ([place]) 04/09/2013   Cataract of left eye 11/25/2011   Corneal opacity 09/30/2011   Pseudophakia of right eye 09/30/2011   GERD (gastroesophageal reflux disease) 09/25/2011   Gout 09/25/2011   Hyperlipidemia LDL goal <70 09/25/2011   Status post cataract extraction 04/11/2011   Cataract extraction status of eye 04/11/2011   Iron deficiency anemia 04/01/2011  Benign localized hyperplasia of prostate without urinary obstruction and other lower urinary tract symptoms (LUTS) 12/10/2010   Enlarged prostate without lower urinary tract symptoms (luts) 12/10/2010   Colon cancer screening 10/11/2010   Hypothyroidism 10/11/2010   Hyperlipidemia 09/20/2003   Essential and other specified forms of tremor 01/05/2001   Essential tremor 01/05/2001    Conditions to be addressed/monitored:CHF, HTN, and Shoulder pain  Care Plan : RNCM: General Plan of Care (Adult) for Chronic Disease Management and Care Cooerdination Needs  Updates made by Vanita Ingles, RN since 01/24/2021 12:00 AM     Problem: RNCM: Development of Plan of Care for Chronic Disease Management (HF, CAD, HTN,HLD, DM, COPD, CKD3, Chronic Pain)   Priority: High     Long-Range Goal: RNCM: Effective Management  of Plan of Care for  Chronic Disease Management (HF, CAD, HTN,HLD, DM, COPD, CKD3, Chronic Pain)   Start Date: 01/22/2021  Expected End Date: 01/22/2022  Priority: High  Note:   Current Barriers:  Knowledge Deficits related to plan of care for management of CHF, CAD, HTN, HLD, COPD, DMII, CKD Stage 3, and chronic pain  Care Coordination needs related to Level of care concerns and multiple chronic conditions  Chronic Disease Management support and education needs related to CHF, CAD, HTN, HLD, COPD, DMII, CKD Stage 3, and chronic pain   RNCM Clinical Goal(s):  Patient will verbalize understanding of plan for management of CHF, CAD, HTN, HLD, COPD, DMII, CKD Stage 3, and Osteoarthritis as evidenced by understanding the plan of care, calling the provider for changes, and working with the CCM to optimize health and well being for effective management of chronic conditions take all medications exactly as prescribed and will call provider for medication related questions as evidenced by taking medications as prescribed, contacting providers for questions about medications, and working with the pharm D to manage polypharmacy and medications concerns     attend all scheduled medical appointments: 03-14-2021 at 2 pm as evidenced by by keeping appointments with pcp and other specialist and calling for new appointments needed or questions        demonstrate improved and ongoing adherence to prescribed treatment plan for CHF, CAD, HTN, HLD, COPD, DMII, CKD Stage 3, and Osteoarthritis as evidenced by working with the CCM team to optimize health and well being  demonstrate a decrease in CHF, CAD, HTN, HLD, COPD, DMII, CKD Stage 3, and Osteoarthritis exacerbations  as evidenced by calling the provider for changes in conditions, stable chronic conditions and working with the CCM team to effectively manage health and well being  demonstrate ongoing self health care management ability for effective management of chronic conditions  as  evidenced by  working with the CCM team  through collaboration with Consulting civil engineer, provider, and care team.   Interventions: 1:1 collaboration with primary care provider regarding development and update of comprehensive plan of care as evidenced by provider attestation and co-signature Inter-disciplinary care team collaboration (see longitudinal plan of care) Evaluation of current treatment plan related to  self management and patient's adherence to plan as established by provider   SDOH Barriers (Status: Goal on Track (progressing): YES.) Long Term Goal  Patient interviewed and SDOH assessment performed        Patient interviewed and appropriate assessments performed Provided patient with information about resources in [facility] and care guides available for assistance with changes in SDOH, questions or concerns Discussed plans with patient for ongoing care management follow up and provided patient with  direct contact information for care management team Advised patient to call the office for changes in SDOH, new concerns or questions  Provided education to patient/caregiver regarding level of care options.    CAD  (Status: Goal on Track (progressing): YES.) Long Term Goal  Assessed understanding of CAD diagnosis Medications reviewed including medications utilized in CAD treatment plan Provided education on importance of blood pressure control in management of CAD; Provided education on Importance of limiting foods high in cholesterol; Counseled on importance of regular laboratory monitoring as prescribed; Reviewed Importance of taking all medications as prescribed Reviewed Importance of attending all scheduled provider appointments Advised to report any changes in symptoms or exercise tolerance Screening for signs and symptoms of depression related to chronic disease state;  Assessed social determinant of health barriers;   Heart Failure Interventions:  (Status: Goal on Track  (progressing): YES.)  Long Term Goal  Basic overview and discussion of pathophysiology of Heart Failure reviewed; Provided education on low sodium diet; Reviewed Heart Failure Action Plan in depth and provided written copy; Assessed need for readable accurate scales in home; Provided education about placing scale on hard, flat surface; Advised patient to weigh each morning after emptying bladder; Discussed importance of daily weight and advised patient to weigh and record daily; Reviewed role of diuretics in prevention of fluid overload and management of heart failure; Discussed the importance of keeping all appointments with provider; Provided patient with education about the role of exercise in the management of heart failure; Advised patient to discuss torsemide dose  with provider.  01-22-2021: The patients wife states that she has not heard back from the heart failure clinic on the Torsemide dose. Advised the patients wife to call the HF clinic back and request clarification on Torsemide dose. The patients wife verbalized understanding. She states he is not having any issues with swelling but he is having a problem with "holding his water" and its been going on for a while. 01-24-2021: The pharm D is also assisting with getting in touch with the cardiologist/HF clinic to get clarification of medication dosage.  Chronic Kidney Disease (Status: Goal on Track (progressing): YES.)  Long Term Goal  Last practice recorded BP readings:  BP Readings from Last 3 Encounters:  12/11/20 (!) 148/75  11/23/20 (!) 132/56  11/08/20 108/66  Most recent eGFR/CrCl:  Lab Results  Component Value Date   EGFR 44 (L) 06/29/2020    No components found for: CRCL  Assessed the patient   and patients wife for   understanding of chronic kidney disease    Evaluation of current treatment plan related to chronic kidney disease self management and patient's adherence to plan as established by provider . 01-22-2021: The  patients wife states the patient is having issues with "holding his water". States the urologist gave him a pill but it does not help. The patient is taking myrbetriq but the patients wife says he cannot tell where it helps him. Advised the patients wife to follow up with urologist, will also collaborate with the pcp for any other additional recommendations. Advised the patients wife with the multiple medications he takes and his chronic conditions that this is something that may be a little harder to manage.     Provided education to patient re: stroke prevention, s/s of heart attack and stroke    Reviewed prescribed diet heart healthy/ADA diet  Reviewed medications with patient and discussed importance of compliance    Advised patient, providing education and rationale, to monitor  blood pressure daily and record, calling PCP for findings outside established parameters    Discussed complications of poorly controlled blood pressure such as heart disease, stroke, circulatory complications, vision complications, kidney impairment, sexual dysfunction    Advised patient to discuss urinary incontinence and frequency  with provider    Discussed plans with patient for ongoing care management follow up and provided patient with direct contact information for care management team    Discussed the impact of chronic kidney disease on daily life and mental health and acknowledged and normalized feelings of disempowerment, fear, and frustration    Engage patient in early, proactive and ongoing discussion about goals of care and what matters most to them      COPD: (Status: Goal on Track (progressing): YES.) Long Term Goal  Reviewed medications with patient, including use of prescribed maintenance and rescue inhalers, and provided instruction on medication management and the importance of adherence Provided patient with basic written and verbal COPD education on self care/management/and exacerbation  prevention; Advised patient to track and manage COPD triggers;  Provided written and verbal instructions on pursed lip breathing and utilized returned demonstration as teach back; Provided instruction about proper use of medications used for management of COPD including inhalers; Advised patient to self assesses COPD action plan zone and make appointment with provider if in the yellow zone for 48 hours without improvement; Advised patient to engage in light exercise as tolerated 3-5 days a week to aid in the the management of COPD; Provided education about and advised patient to utilize infection prevention strategies to reduce risk of respiratory infection; Discussed the importance of adequate rest and management of fatigue with COPD;  Diabetes:  (Status: Goal on Track (progressing): YES.) Long Term Goal   Lab Results  Component Value Date   HGBA1C 6.2 (A) 10/16/2020  Assessed patient's understanding of A1c goal: <7% Provided education to patient about basic DM disease process; Reviewed medications with patient and discussed importance of medication adherence;        Reviewed prescribed diet with patient heart healthy/ADA; Counseled on importance of regular laboratory monitoring as prescribed;        Discussed plans with patient for ongoing care management follow up and provided patient with direct contact information for care management team;      Provided patient with written educational materials related to hypo and hyperglycemia and importance of correct treatment;       Reviewed scheduled/upcoming provider appointments including: 03-14-2021 at 2 pm;         Advised patient, providing education and rationale, to check cbg as directed  and record        call provider for findings outside established parameters;       Review of patient status, including review of consultants reports, relevant laboratory and other test results, and medications completed;        Hyperlipidemia:  (Status:  Goal on Track (progressing): YES.) Lab Results  Component Value Date   CHOL 123 06/29/2020   HDL 29 (L) 06/29/2020   LDLCALC 65 06/29/2020   TRIG 170 (H) 06/29/2020   CHOLHDL 4.2 06/29/2020     Medication review performed; medication list updated in electronic medical record.  Provider established cholesterol goals reviewed; Counseled on importance of regular laboratory monitoring as prescribed; Provided HLD educational materials; Reviewed role and benefits of statin for ASCVD risk reduction; Discussed strategies to manage statin-induced myalgias; Reviewed importance of limiting foods high in cholesterol; Reviewed exercise goals and target of 150  minutes per week;  Hypertension: (Status: Goal on Track (progressing): YES.) Last practice recorded BP readings:  BP Readings from Last 3 Encounters:  12/11/20 (!) 148/75  11/23/20 (!) 132/56  11/08/20 108/66  Most recent eGFR/CrCl:  Lab Results  Component Value Date   EGFR 44 (L) 06/29/2020    No components found for: CRCL  Evaluation of current treatment plan related to hypertension self management and patient's adherence to plan as established by provider. 01-24-2021: The patient was eating breakfast. Reviewed with wife several upcoming appointments and follow ups scheduled.    Provided education to patient re: stroke prevention, s/s of heart attack and stroke; Reviewed prescribed diet heart healthy/ADA Reviewed medications with patient and discussed importance of compliance;  Discussed plans with patient for ongoing care management follow up and provided patient with direct contact information for care management team; Advised patient, providing education and rationale, to monitor blood pressure daily and record, calling PCP for findings outside established parameters;  Advised patient to discuss blood pressure trends  with provider; Provided education on prescribed diet heart healthy/ADA diet ;  Discussed complications of poorly  controlled blood pressure such as heart disease, stroke, circulatory complications, vision complications, kidney impairment, sexual dysfunction;   Pain:  (Status: Goal on track: NO.) Long Term Goal  Pain assessment performed. 03-24-2020: the patient rates his pain in his bilateral shoulders at an 8 today on a scale of 0-10.  The patient states that he cannot raise his arms but so high. The patient is not taking any medications for pain relief. Has not tried heat or cold applications. The patient states the injections he gets at the provider office usually help but they have not this time. The patient states that he is open to recommendations.  Medications reviewed. 01-22-2021: The patient is currently not taking any medications for pain relief. Does have gabapentin that he takes on a consistent basis for neuropathy pain. Dosage in question concerning this as the neurologist has increased dose to 800 mg and renal dose recommends dose not to exceed 600 mg BID. Education with wife concerning medication changes.  Reviewed provider established plan for pain management. 01-22-2021: The patient and the patients wife ask if physical therapy would help with shoulder pain and discomfort. Will collaborate with the pcp on recommendations and let the patient and the patients wife know. 01-24-2021: Call made to [facility] on 01-23-2021 to ask about referral from pcp office. The staff states the referral was received and the patient has an appointment With Dr. Eliberto Ivory at 10 am on 02-14-2021.  Attempted to reach the patient or patients wife but was unsuccessful. Returned call today and explained to the wife the orthopedic appointment would address the patients shoulder pain and evaluate and recommend further treatment. Used teach back method for understanding of upcoming appointment with the orthopedic provider. Will continue to monitor.  Discussed importance of adherence to all scheduled medical appointments; Counseled on the  importance of reporting any/all new or changed pain symptoms or management strategies to pain management provider; Advised patient to report to care team affect of pain on daily activities; Discussed use of relaxation techniques and/or diversional activities to assist with pain reduction (distraction, imagery, relaxation, massage, acupressure, TENS, heat, and cold application; Reviewed with patient prescribed pharmacological and nonpharmacological pain relief strategies; Advised patient to discuss unresolved pain, changes in level and intensity of pain with provider;  Patient Goals/Self-Care Activities: Patient will self administer medications as prescribed as evidenced by self report/primary caregiver report  Patient will attend all scheduled provider appointments as evidenced by clinician review of documented attendance to scheduled appointments and patient/caregiver report Patient will call pharmacy for medication refills as evidenced by patient report and review of pharmacy fill history as appropriate Patient will attend church or other social activities as evidenced by patient report Patient will continue to perform ADL's independently as evidenced by patient/caregiver report Patient will continue to perform IADL's independently as evidenced by patient/caregiver report Patient will call provider office for new concerns or questions as evidenced by review of documented incoming telephone call notes and patient report Patient will work with BSW to address care coordination needs and will continue to work with the clinical team to address health care and disease management related needs as evidenced by documented adherence to scheduled care management/care coordination appointments - call office if I gain more than 2 pounds in one day or 5 pounds in one week - keep legs up while sitting - track weight in diary - use salt in moderation - watch for swelling in feet, ankles and legs every day -  weigh myself daily - develop a rescue plan - follow rescue plan if symptoms flare-up - know when to call the doctor:for changes in HF sx and sx - track symptoms and what helps feel better or worse - dress right for the weather, hot or cold - schedule appointment with eye doctor - check blood sugar at prescribed times: before meals and at bedtime, when you have symptoms of low or high blood sugar, and before and after exercise - check feet daily for cuts, sores or redness - enter blood sugar readings and medication or insulin into daily log - take the blood sugar log to all doctor visits - trim toenails straight across - drink 6 to 8 glasses of water each day - eat fish at least once per week - fill half of plate with vegetables - limit fast food meals to no more than 1 per week - manage portion size - prepare main meal at home 3 to 5 days each week - read food labels for fat, fiber, carbohydrates and portion size - reduce red meat to 2 to 3 times a week - keep feet up while sitting - wash and dry feet carefully every day - wear comfortable, cotton socks - wear comfortable, well-fitting shoes Keep appointments with the podiatrist, next appointment on 01-24-2021 - avoid second hand smoke - eliminate smoking in my home - identify and remove indoor air pollutants - limit outdoor activity during cold weather - listen for public air quality announcements every day - do breathing exercises every day - develop a rescue plan - eliminate symptom triggers at home - follow rescue plan if symptoms flare-up - use an extra pillow to sleep - develop a new routine to improve sleep - don't eat or exercise right before bedtime - eat healthy/prescribed diet: heart healthy/ADA diet  - get at least 7 to 8 hours of sleep at night - use devices that will help like a cane, sock-puller or reacher - practice relaxation or meditation daily - do exercises in a comfortable position that makes breathing as  easy as possible - check blood pressure 3 times per week - choose a place to take my blood pressure (home, clinic or office, retail store) - write blood pressure results in a log or diary - learn about high blood pressure - keep a blood pressure log - take blood pressure log to all doctor appointments -  call doctor for signs and symptoms of high blood pressure - develop an action plan for high blood pressure - keep all doctor appointments - take medications for blood pressure exactly as prescribed - report new symptoms to your doctor - eat more whole grains, fruits and vegetables, lean meats and healthy fats - call for medicine refill 2 or 3 days before it runs out - take all medications exactly as prescribed - call doctor with any symptoms you believe are related to your medicine - call doctor when you experience any new symptoms - go to all doctor appointments as scheduled - adhere to prescribed diet: heart healthy/ADA diet        Plan:Telephone follow up appointment with care management team member scheduled for:  03-26-2021 at [facility] am  [place], MSN, [place] Mobile: [phone]

## 2021-01-24 NOTE — Patient Instructions (Signed)
Visit Information Patient will self administer medications as prescribed as evidenced by self report/primary caregiver report  Patient will attend all scheduled provider appointments as evidenced by clinician review of documented attendance to scheduled appointments and patient/caregiver report Patient will call pharmacy for medication refills as evidenced by patient report and review of pharmacy fill history as appropriate Patient will attend church or other social activities as evidenced by patient report Patient will continue to perform ADL's independently as evidenced by patient/caregiver report Patient will continue to perform IADL's independently as evidenced by patient/caregiver report Patient will call provider office for new concerns or questions as evidenced by review of documented incoming telephone call notes and patient report Patient will work with BSW to address care coordination needs and will continue to work with the clinical team to address health care and disease management related needs as evidenced by documented adherence to scheduled care management/care coordination appointments - call office if I gain more than 2 pounds in one day or 5 pounds in one week - keep legs up while sitting - track weight in diary - use salt in moderation - watch for swelling in feet, ankles and legs every day - weigh myself daily - develop a rescue plan - follow rescue plan if symptoms flare-up - know when to call the doctor:for changes in HF sx and sx - track symptoms and what helps feel better or worse - dress right for the weather, hot or cold - schedule appointment with eye doctor - check blood sugar at prescribed times: before meals and at bedtime, when you have symptoms of low or high blood sugar, and before and after exercise - check feet daily for cuts, sores or redness - enter blood sugar readings and medication or insulin into daily log - take the blood sugar log to all doctor  visits - trim toenails straight across - drink 6 to 8 glasses of water each day - eat fish at least once per week - fill half of plate with vegetables - limit fast food meals to no more than 1 per week - manage portion size - prepare main meal at home 3 to 5 days each week - read food labels for fat, fiber, carbohydrates and portion size - reduce red meat to 2 to 3 times a week - keep feet up while sitting - wash and dry feet carefully every day - wear comfortable, cotton socks - wear comfortable, well-fitting shoes Keep appointments with the podiatrist, next appointment on 01-24-2021 - avoid second hand smoke - eliminate smoking in my home - identify and remove indoor air pollutants - limit outdoor activity during cold weather - listen for public air quality announcements every day - do breathing exercises every day - develop a rescue plan - eliminate symptom triggers at home - follow rescue plan if symptoms flare-up - use an extra pillow to sleep - develop a new routine to improve sleep - don't eat or exercise right before bedtime - eat healthy/prescribed diet: heart healthy/ADA diet  - get at least 7 to 8 hours of sleep at night - use devices that will help like a cane, sock-puller or reacher - practice relaxation or meditation daily - do exercises in a comfortable position that makes breathing as easy as possible - check blood pressure 3 times per week - choose a place to take my blood pressure (home, clinic or office, retail store) - write blood pressure results in a log or diary - learn about high blood  pressure - keep a blood pressure log - take blood pressure log to all doctor appointments - call doctor for signs and symptoms of high blood pressure - develop an action plan for high blood pressure - keep all doctor appointments - take medications for blood pressure exactly as prescribed - report new symptoms to your doctor - eat more whole grains, fruits and  vegetables, lean meats and healthy fats - call for medicine refill 2 or 3 days before it runs out - take all medications exactly as prescribed - call doctor with any symptoms you believe are related to your medicine - call doctor when you experience any new symptoms - go to all doctor appointments as scheduled - adhere to prescribed diet: heart healthy/ADA diet    The patient verbalized understanding of instructions, educational materials, and care plan provided today and declined offer to receive copy of patient instructions, educational materials, and care plan.   Telephone follow up appointment with care management team member scheduled for: 03-26-2021  Noreene Larsson RN, MSN, [place] Mobile: [phone]

## 2021-01-25 ENCOUNTER — Telehealth: Payer: Self-pay

## 2021-01-25 NOTE — Telephone Encounter (Signed)
   Telephone encounter was:  Successful.  01/25/2021 Name: Lucas Caldwell MRN: [id number] DOB: February 21, 1934  Lucas Caldwell is a 87 y.o. year old male who is a primary care patient of [name], DO . The community resource team was consulted for assistance with Transportation Needs   Care guide performed the following interventions:  Transportation has been arranged for 11/16 and 11/17. Spouse and pt have been advised. Lucas Caldwell will be providing a round trip for pt and spouse. They have been educated on how and when to call [facility]. [phone] is the contact number.  Follow Up Plan:  No further follow up planned at this time. The patient has been provided with needed resources. Spouse acknowledge she understood the process and verbalized everything I stated regarding the phone number, pick up time, and how and when to contact [place].  [facility] management  [place], [place]  Main Phone: [phone]  E-mail: [email or web address]  Website: [email or web address]

## 2021-01-31 ENCOUNTER — Telehealth: Payer: Self-pay | Admitting: Internal Medicine

## 2021-01-31 ENCOUNTER — Ambulatory Visit (INDEPENDENT_AMBULATORY_CARE_PROVIDER_SITE_OTHER): Payer: Medicare HMO

## 2021-01-31 ENCOUNTER — Other Ambulatory Visit: Payer: Self-pay

## 2021-01-31 DIAGNOSIS — I480 Paroxysmal atrial fibrillation: Secondary | ICD-10-CM | POA: Diagnosis not present

## 2021-01-31 DIAGNOSIS — Z7901 Long term (current) use of anticoagulants: Secondary | ICD-10-CM

## 2021-01-31 DIAGNOSIS — R6889 Other general symptoms and signs: Secondary | ICD-10-CM | POA: Diagnosis not present

## 2021-01-31 LAB — POCT INR: INR: 1.9 — AB (ref 2.0–3.0)

## 2021-01-31 NOTE — Patient Instructions (Signed)
-   START NEW dosage of warfarin of 1.5 TABLETS EVERY DAY EXCEPT 1 TABLETS ON MONDAYS & FRIDAYS. - recheck INR in 4 weeks.

## 2021-01-31 NOTE — Telephone Encounter (Signed)
I called and spoke with Colletta Maryland at [facility] and she stated they just got in a shipment of Cpaps and he is on the list to call

## 2021-02-01 ENCOUNTER — Ambulatory Visit (HOSPITAL_COMMUNITY)
Admission: RE | Admit: 2021-02-01 | Discharge: 2021-02-01 | Disposition: A | Discharge status: Home / Self Care | Payer: Medicare HMO | Source: Ambulatory Visit | Attending: Internal Medicine | Admitting: Internal Medicine

## 2021-02-01 ENCOUNTER — Encounter (HOSPITAL_COMMUNITY): Payer: Self-pay | Admitting: Internal Medicine

## 2021-02-01 VITALS — BP 110/64 | HR 81 | Wt 196.0 lb

## 2021-02-01 DIAGNOSIS — Z7901 Long term (current) use of anticoagulants: Secondary | ICD-10-CM | POA: Insufficient documentation

## 2021-02-01 DIAGNOSIS — I471 Supraventricular tachycardia: Secondary | ICD-10-CM | POA: Insufficient documentation

## 2021-02-01 DIAGNOSIS — C9210 Chronic myeloid leukemia, BCR/ABL-positive, not having achieved remission: Secondary | ICD-10-CM | POA: Insufficient documentation

## 2021-02-01 DIAGNOSIS — N183 Chronic kidney disease, stage 3 unspecified: Secondary | ICD-10-CM

## 2021-02-01 DIAGNOSIS — Z79899 Other long term (current) drug therapy: Secondary | ICD-10-CM | POA: Insufficient documentation

## 2021-02-01 DIAGNOSIS — E785 Hyperlipidemia, unspecified: Secondary | ICD-10-CM | POA: Insufficient documentation

## 2021-02-01 DIAGNOSIS — I2510 Atherosclerotic heart disease of native coronary artery without angina pectoris: Secondary | ICD-10-CM | POA: Diagnosis not present

## 2021-02-01 DIAGNOSIS — E1122 Type 2 diabetes mellitus with diabetic chronic kidney disease: Secondary | ICD-10-CM | POA: Diagnosis not present

## 2021-02-01 DIAGNOSIS — I480 Paroxysmal atrial fibrillation: Secondary | ICD-10-CM

## 2021-02-01 DIAGNOSIS — I10 Essential (primary) hypertension: Secondary | ICD-10-CM

## 2021-02-01 DIAGNOSIS — N400 Enlarged prostate without lower urinary tract symptoms: Secondary | ICD-10-CM | POA: Diagnosis not present

## 2021-02-01 DIAGNOSIS — N184 Chronic kidney disease, stage 4 (severe): Secondary | ICD-10-CM | POA: Insufficient documentation

## 2021-02-01 DIAGNOSIS — I130 Hypertensive heart and chronic kidney disease with heart failure and stage 1 through stage 4 chronic kidney disease, or unspecified chronic kidney disease: Secondary | ICD-10-CM | POA: Diagnosis not present

## 2021-02-01 DIAGNOSIS — I5022 Chronic systolic (congestive) heart failure: Secondary | ICD-10-CM

## 2021-02-01 DIAGNOSIS — D509 Iron deficiency anemia, unspecified: Secondary | ICD-10-CM | POA: Diagnosis not present

## 2021-02-01 DIAGNOSIS — Z7984 Long term (current) use of oral hypoglycemic drugs: Secondary | ICD-10-CM | POA: Insufficient documentation

## 2021-02-01 DIAGNOSIS — Z955 Presence of coronary angioplasty implant and graft: Secondary | ICD-10-CM | POA: Diagnosis not present

## 2021-02-01 DIAGNOSIS — E039 Hypothyroidism, unspecified: Secondary | ICD-10-CM | POA: Insufficient documentation

## 2021-02-01 DIAGNOSIS — K264 Chronic or unspecified duodenal ulcer with hemorrhage: Secondary | ICD-10-CM | POA: Diagnosis not present

## 2021-02-01 DIAGNOSIS — R6889 Other general symptoms and signs: Secondary | ICD-10-CM | POA: Diagnosis not present

## 2021-02-01 LAB — BASIC METABOLIC PANEL
Anion gap: 11 (ref 5–15)
BUN: 25 mg/dL — ABNORMAL HIGH (ref 8–23)
CO2: 24 mmol/L (ref 22–32)
Calcium: 8.9 mg/dL (ref 8.9–10.3)
Chloride: 100 mmol/L (ref 98–111)
Creatinine, Ser: 1.8 mg/dL — ABNORMAL HIGH (ref 0.61–1.24)
GFR, Estimated: 36 mL/min — ABNORMAL LOW (ref >60)
Glucose, Bld: 146 mg/dL — ABNORMAL HIGH (ref 70–99)
Potassium: 3.8 mmol/L (ref 3.5–5.1)
Sodium: 135 mmol/L (ref 135–145)

## 2021-02-01 LAB — BRAIN NATRIURETIC PEPTIDE: B Natriuretic Peptide: 41 pg/mL (ref 0.0–100.0)

## 2021-02-01 NOTE — Progress Notes (Signed)
ReDS Vest / Clip - 02/01/21 1000       ReDS Vest / Clip   Station Marker B    Ruler Value 39    ReDS Value Range Low volume    ReDS Actual Value 28

## 2021-02-01 NOTE — Progress Notes (Signed)
ADVANCED HF CLINIC PROGRESS NOTE  Primary Cardiologist: Dr. Saunders Revel AHF: [name]   Reasons for Visit: F/u for chronic systolic heart failure    HPI: Lucas Caldwell is a 87 y.o. 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 Saunders Revel for further evaluation of his HF.    Has h/o NICM previously followed by Dr. Carolynn Serve at [facility]. He had good response to medical therapy in 2012 EF was reported to be 50%   Had PCI of his RCA CAD s/p RCA stent x2 08/2016 at [facility].   Echo 6/20 LVEF 35-40%  Cardiac cath 08/2018 70% LM stenosis with abnl FFR of 0.86, though drop in iFR was due to diffuse prox LAD disease and only small gradient involving ostial LM. Imdur was increased.   Admitted 04/13/19 with chest pain and A/C systolic heart failure. Repeat Cath 1/21 LM 60% LAD 70% with +FFR otherwise diffuse non-obstructive CAD -> PCI/DES LAD 1/21. RHC: RA 10 PA 53/30 (40) PCWP 28 CO/CI 4.2/2.1 PA sat 67%.  Echo 04/22/19 EF 30-35%  Referred to [facility] 04/2019 for further management of his systolic HF. Had cardiomems implanted in 7/22  Zio 4/21 SR w/ 1st AVB, avg HR 81 bpm, 1 brief 5 beat run of NSVT, Two runs SVT  Echo 8/22 EF 30-35% RV ok   [name] here for routine f/u with his wife. Still feels SOB at times but overall doing ok. Can get to the mailbox and back without problem. Can also go to store. Able to bring wood in. No edema, orthopnea or PND. Compliant with meds. Gets dizzy at times.   Cardiomems 15 (goal 15) ReDS 28%    Past Medical History:  Diagnosis Date   Arthritis    "probably in his legs" (08/15/2016)   Blind right eye    BPH (benign prostatic hyperplasia)     Breast asymmetry    Left breast is larger, present for several years.   CAD (coronary artery disease)    a. Cath in the late 90's - reportedly ok;  b. 2014 s/p stenting x 2 @ [facility]; c 08/19/16 Cath/PCI with DES -> RCA, plan to treat LM 70% medically. Seen by surgery and felt to be too high risk for CABG; d. 08/2018 Cath: LM 70 (iFR 0.86), LAD 20ost, 40p, 50/28m, D1 70, LCX patent stent, OM1 30, RCA patent stent, 66m ISR, 40d, RPDA 30. PCWP 8. CO/CI 4.0/2.1; e. 03/2019 PCI to LAD (2.5x15 Resolute Onyx DES).   Chronic combined systolic (congestive) and diastolic (congestive) heart failure ([place])    a. Previously reduced EF-->50% by echo in 2012;  b. 06/2015 Echo: EF 50-55%  c. 07/2016 Echo: EF 45-50%; d. 12/2017 Echo: EF 55%; e. 08/2018 Echo: EF 35-40%; f. 04/2019 Echo: EF 30-35%; g. s/p cardiomems; h. 10/2020 Echo: EF 30-35%, mod-sev glob HK, gr1 DD.   Chronic kidney disease (CKD), stage IV (severe) (HCC)    CML (chronic myelocytic leukemia) (HCC)    GERD (gastroesophageal reflux disease)    GIB (gastrointestinal bleeding)    a. 12/2017 3 unit PRBC GIB in setting of coumadin-->Endo/colon multiple duodenal ulcers and a single bleeding ulcer in the proximal ascending colon status post hemostatic clipping x2.   Gout  Hyperlipemia    Hypertension    Hypothyroidism    Iron deficiency anemia    Ischemic cardiomyopathy    a. Previously reduced EF-->50% by echo in 2012;  b. 06/2015 Echo: EF 50-55%, Gr2 DD;  c. 07/2016 Echo: EF 45-50%; d. 12/2017 Echo: EF 55%, Gr1 DD, mild MR; e. 08/2018 Echo: EF 35-40%; f. 04/2019 Echo: EF 30-35%; g. 10/2020 Echo: EF 30-35%.   Migraine    "in the 1960s" (08/15/2016)   Obstructive sleep apnea    Orthostatic hypotension    a. 20222 -->carvedilol and hydralazine d/c'd.   PAF (paroxysmal atrial fibrillation) (HCC)    a. ? Dx 2014-->s/p DCCV;  b. CHA2DS2VASc = 6--> Coumadin.   Prostate cancer (HCC)    RBBB    Type II diabetes mellitus (HCC)     Current Outpatient Medications   Medication Sig Dispense Refill   acetaminophen (TYLENOL) 500 MG tablet Take 1,000 mg by mouth every 6 (six) hours as needed for moderate pain or headache.      Alcohol Swabs (B-D SINGLE USE SWABS REGULAR) PADS Use to check blood sugar up to 2 times daily 200 each 3   allopurinol (ZYLOPRIM) 100 MG tablet TAKE ONE TABLET BY MOUTH ONCE DAILY 90 tablet 0   aspirin 81 MG chewable tablet Chew 81 mg by mouth daily.     Blood Glucose Monitoring Suppl (TRUE METRIX METER) DEVI Use to check blood sugar up to 2 times daily 1 each 0   bosutinib (BOSULIF) 100 MG tablet Take 100 mg by mouth at bedtime. Take with food.     Cholecalciferol 25 MCG (1000 UT) capsule Take 1,000 Units by mouth daily.     empagliflozin (JARDIANCE) 10 MG TABS tablet Take 1 tablet (10 mg total) by mouth daily with breakfast. Please make an appointment for further refills 30 tablet 0   ferrous sulfate 325 (65 FE) MG tablet Take by mouth.     gabapentin (NEURONTIN) 300 MG capsule Take 1 capsule by mouth 3 (three) times daily.     Insulin Glargine ([name] KWIKPEN) 100 UNIT/ML Inject 18 Units into the skin daily.     Insulin Pen Needle 31G X 5 MM MISC 31 g by Does not apply route as directed. 100 each 5   isosorbide mononitrate (IMDUR) 60 MG 24 hr tablet TAKE ONE TABLET BY MOUTH ONCE DAILY 90 tablet 0   levothyroxine (SYNTHROID) 25 MCG tablet TAKE ONE TABLET BY MOUTH EVERY MORNING BEFORE BREAKFAST 90 tablet 1   losartan (COZAAR) 25 MG tablet Take 0.5 tablets (12.5 mg total) by mouth daily. 90 tablet 3   Menthol-Zinc Oxide (GOLD BOND EX) Apply 1 application topically daily as needed (muscle pain).     mirabegron ER (MYRBETRIQ) 25 MG TB24 tablet Take 1 tablet (25 mg total) by mouth daily. From Urology 30 tablet 11   Multiple Vitamin (MULTIVITAMIN WITH MINERALS) TABS tablet Take 1 tablet by mouth daily.     nitroGLYCERIN (NITROSTAT) 0.4 MG SL tablet Place 1 tablet (0.4 mg total) under the tongue every 5 (five) minutes x 3 doses as needed  for chest pain. 25 tablet 4   omeprazole (PRILOSEC) 20 MG capsule Take 20 mg by mouth in the morning.      Polyethyl Glycol-Propyl Glycol (SYSTANE OP) Place 1 drop into both eyes 3 (three) times daily as needed (dry/irritated eyes.).      polyethylene glycol (MIRALAX / GLYCOLAX) 17 g packet Take 17 g by mouth daily as needed for mild constipation.  potassium chloride (KLOR-CON) 10 MEQ tablet Take 1 tablet (10 mEq total) by mouth daily. 90 tablet 1   ranolazine (RANEXA) 500 MG 12 hr tablet TAKE ONE TABLET BY MOUTH TWICE DAILY 180 tablet 2   rosuvastatin (CRESTOR) 5 MG tablet TAKE ONE TABLET BY MOUTH ONCE DAILY 90 tablet 0   tamsulosin (FLOMAX) 0.4 MG CAPS capsule Take 1 capsule (0.4 mg total) by mouth daily after supper. 30 capsule 11   torsemide (DEMADEX) 20 MG tablet Take 3 tablets (60 mg total) by mouth daily. 90 tablet 6   traMADol (ULTRAM) 50 MG tablet TAKE 1 OR 2 TABLETS BY MOUTH AT BEDTIME AS NEEDED 30 tablet 2   TRULICITY 1.5 BT/5.1VO SOPN Inject 1.5 mg into the skin once a week. 6 mL 3   warfarin (COUMADIN) 3 MG tablet TAKE 1 TO 1 & 1/2 TABLETS BY MOUTH AS DIRECTED BY THE COUMADIN CLINIC 120 tablet 0   No current facility-administered medications for this encounter.    Allergies  Allergen Reactions   Clopidogrel Anaphylaxis and Other (See Comments)    altered mental status;  (electrolytes "out of wack" and confusion per family; THEY DO NOT REMEMBER ANY OTHER REACTION)- MSB 10/10/15   Ciprofloxacin Itching   Mirtazapine Diarrhea    Bad dreams    Spironolactone Other (See Comments)    gynecomastia      Social History   Socioeconomic History   Marital status: Married    Spouse name: Not on file   Number of children: Not on file   Years of education: Not on file   Highest education level: 7th grade  Occupational History   Occupation: Retired    Comment: Owned his own [employer].  Tobacco Use   Smoking status: Former    Packs/day: 1.75    Years: 20.00    Pack  years: 35.00    Types: Cigarettes    Start date: 53    Quit date: 1975    Years since quitting: 47.9   Smokeless tobacco: Former  Scientific laboratory technician Use: Never used  Substance and Sexual Activity   Alcohol use: No    Comment: previously drank heavily - quit 1979.   Drug use: No   Sexual activity: Never  Other Topics Concern   Not on file  Social History Narrative   Working on transportation truck part time    Social Determinants of Radio broadcast assistant Strain: Low Risk    Difficulty of Paying Living Expenses: Not hard at all  Food Insecurity: No Food Insecurity   Worried About Charity fundraiser in the Last Year: Never true   Arboriculturist in the Last Year: Never true  Transportation Needs: No Transportation Needs   Lack of Transportation (Medical): No   Lack of Transportation (Non-Medical): No  Physical Activity: Inactive   Days of Exercise per Week: 0 days   Minutes of Exercise per Session: 0 min  Stress: No Stress Concern Present   Feeling of Stress : Not at all  Social Connections: Moderately Integrated   Frequency of Communication with Friends and Family: More than three times a week   Frequency of Social Gatherings with Friends and Family: More than three times a week   Attends Religious Services: More than 4 times per year   Active Member of [employer] or Organizations: No   Attends Archivist Meetings: Never   Marital Status: Married  Human resources officer Violence: Not At Risk  Fear of Current or Ex-Partner: No   Emotionally Abused: No   Physically Abused: No   Sexually Abused: No      Family History  Problem Relation Age of Onset   Brain cancer Father    Diabetes Sister    Heart attack Sister     Vitals:   02/01/21 1029  BP: 110/64  Pulse: 81  SpO2: 98%  Weight: 88.9 kg (196 lb)    Lucas Readings from Last 3 Encounters:  02/01/21 88.9 kg (196 lb)  12/11/20 88.1 kg (194 lb 3.2 oz)  11/23/20 87.3 kg (192 lb 6.4 oz)   PHYSICAL  EXAM: General:  Elderly Well appearing. No resp difficulty HEENT: normal Neck: supple. no JVD. Carotids 2+ bilat; no bruits. No lymphadenopathy or thryomegaly appreciated. Cor: PMI nondisplaced. Regular rate & rhythm. No rubs, gallops or murmurs. Lungs: clear Abdomen: soft, nontender, nondistended. No hepatosplenomegaly. No bruits or masses. Good bowel sounds. Extremities: no cyanosis, clubbing, rash, edema Neuro: alert & orientedx3, cranial nerves grossly intact. moves all 4 extremities w/o difficulty. Affect pleasant  ASSESSMENT & PLAN:  1. Chronic systolic HF: - likely combination of iCM/NICM - EF 30-35% echo 2/21 (was 35-40% in 6/20) - RHC 04/13/19 with elevated filling pressures but BNP normal  - Stable NYHA III - Volume status looks good. Cardiomems reviewed with him and almost all PADs < 15 - Continue torsemide to 60 mg daily - Was on hydralazine 25 tid but not on his list today and I can't find any notes in which he was told to stop it. 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 Without hydral may be able to stop soon unless he needs for CAD - labs today   2. CAD - cath 04/13/19 LAD 70% with +FFR otherwise diffuse non-obstructive CAD  - S/p PCI/DES to LAD 1/21 - followed by Dr. Saunders Revel - no s/s angina - No ASA with coumadin.  3. CKD Stage 3a - baseline SCr ~1.5  - recheck labs today  4. DM  - continue Jardiance 10 mg daily   5. PAF - Zio patch 4/21 showed no AF. Mostly NSR w/ brief SVT/ NSVT, PACs and PVCs - Continue warfarin   Glori Bickers, MD  10:58 AM

## 2021-02-01 NOTE — Patient Instructions (Signed)
Take Medications as prescribed.  Labs done today, your results will be available in MyChart, we will contact you for abnormal readings.  Your physician recommends that you schedule a follow-up appointment in: 6 months (May 2023) ** call in March 2023** to make appointment.  If you have any questions or concerns before your next appointment please send [place] a message through [place] or call our office at [phone].    TO LEAVE A MESSAGE FOR THE NURSE SELECT OPTION 2, PLEASE LEAVE A MESSAGE INCLUDING: YOUR NAME DATE OF BIRTH CALL BACK NUMBER REASON FOR CALL**this is important as we prioritize the call backs  YOU WILL RECEIVE A CALL BACK THE SAME DAY AS LONG AS YOU CALL BEFORE 4:00 PM  At the [facility], you and your health needs are our priority. As part of our continuing mission to provide you with exceptional heart care, we have created designated Provider Care Teams. These Care Teams include your primary Cardiologist (physician) and Advanced Practice Providers (APPs- Physician Assistants and Nurse Practitioners) who all work together to provide you with the care you need, when you need it.   You may see any of the following providers on your designated Care Team at your next follow up: Dr Glori Bickers Dr Haynes Kerns, NP Lyda Jester, [place] [facility] [place], [place] Audry Riles, PharmD   Please be sure to bring in all your medications bottles to every appointment.

## 2021-02-02 ENCOUNTER — Other Ambulatory Visit: Payer: Self-pay | Admitting: Internal Medicine

## 2021-02-05 ENCOUNTER — Ambulatory Visit: Payer: Medicare HMO | Admitting: Pharmacist

## 2021-02-05 ENCOUNTER — Telehealth: Payer: Self-pay | Admitting: Family Medicine

## 2021-02-05 DIAGNOSIS — E1121 Type 2 diabetes mellitus with diabetic nephropathy: Secondary | ICD-10-CM

## 2021-02-05 DIAGNOSIS — Z794 Long term (current) use of insulin: Secondary | ICD-10-CM

## 2021-02-05 DIAGNOSIS — I5032 Chronic diastolic (congestive) heart failure: Secondary | ICD-10-CM

## 2021-02-05 DIAGNOSIS — E876 Hypokalemia: Secondary | ICD-10-CM

## 2021-02-05 MED ORDER — JARDIANCE 10 MG PO TABS: 10.0000 mg | ORAL_TABLET | Freq: Every day | ORAL | 1 refills | Status: DC

## 2021-02-05 MED ORDER — POTASSIUM CHLORIDE ER 10 MEQ PO TBCR: 10.0000 meq | EXTENDED_RELEASE_TABLET | Freq: Every day | ORAL | 1 refills | Status: DC

## 2021-02-05 NOTE — Chronic Care Management (AMB) (Signed)
Chronic Care Management CCM Pharmacy Note  02/05/2021 Name:  Lucas Caldwell MRN:  [id number] DOB:  [birth year]  Subjective: Lucas Caldwell is an 87 y.o. year old male who is a primary patient of [name], DO.  The CCM team was consulted for assistance with disease management and care coordination needs.    Engaged with patient by telephone for follow up visit for pharmacy case management and/or care coordination services.   Objective:  Medications Reviewed Today     Reviewed by Rennis Petty, RPH-CPP (Pharmacist) on 02/05/21 at 78  Med List Status: <None>   Medication Order Taking? Sig Documenting Provider Last Dose Status Informant  acetaminophen (TYLENOL) 500 MG tablet [id number]  Take 1,000 mg by mouth every 6 (six) hours as needed for moderate pain or headache.  [provider]  Active Spouse/Significant Other  Alcohol Swabs (B-D SINGLE USE SWABS REGULAR) PADS [id number]  Use to check blood sugar up to 2 times daily [name], DO  Active   allopurinol (ZYLOPRIM) 100 MG tablet [id number]  TAKE ONE TABLET BY MOUTH ONCE DAILY Bensimhon, Shaune Pascal, MD  Active   aspirin 81 MG chewable tablet [id number]  Chew 81 mg by mouth daily. [provider]  Active Spouse/Significant Other  Blood Glucose Monitoring Suppl (TRUE METRIX METER) DEVI [id number]  Use to check blood sugar up to 2 times daily [name], DO  Active   bosutinib (BOSULIF) 100 MG tablet [id number]  Take 100 mg by mouth at bedtime. Take with food. [provider]  Active            Med Note Kenton Kingfisher, Germaine Pomfret Sep 15, 2019  8:38 AM)    Cholecalciferol 25 MCG (1000 UT) capsule [id number]  Take 1,000 Units by mouth daily. [provider]  Active   empagliflozin (JARDIANCE) 10 MG TABS tablet [id number] Yes Take 1 tablet (10 mg total) by mouth daily with breakfast. Please make an appointment for further refills Clegg, Amy D, NP Taking Active    ferrous sulfate 325 (65 FE) MG tablet [id number]  Take by mouth. [provider]  Active   gabapentin (NEURONTIN) 300 MG capsule [id number] Yes Take 1 capsule by mouth 3 (three) times daily. [provider] Taking Active   Insulin Glargine [facility] KWIKPEN) 100 UNIT/ML [id number] Yes Inject 18 Units into the skin daily. [provider] Taking Active   Insulin Pen Needle 31G X 5 MM MISC [id number]  31 g by Does not apply route as directed. [name], NP  Active Spouse/Significant Other  isosorbide mononitrate (IMDUR) 60 MG 24 hr tablet [id number] Yes TAKE ONE TABLET BY MOUTH ONCE DAILY End, [name], MD Taking Active   levothyroxine (SYNTHROID) 25 MCG tablet [id number]  TAKE ONE TABLET BY MOUTH EVERY MORNING BEFORE BREAKFAST Karamalegos, [name], DO  Active   losartan (COZAAR) 25 MG tablet [id number] Yes Take 0.5 tablets (12.5 mg total) by mouth daily. Theora Gianotti, NP Taking Active   Menthol-Zinc Oxide (GOLD BOND EX) [id number]  Apply 1 application topically daily as needed (muscle pain). [provider]  Active   mirabegron ER (MYRBETRIQ) 25 MG TB24 tablet [id number]  Take 1 tablet (25 mg total) by mouth daily. From Urology Debroah Loop, PA-C  Active   Multiple Vitamin (MULTIVITAMIN WITH MINERALS) TABS tablet [id number]  Take 1 tablet by mouth daily. [provider]  Active Spouse/Significant Other  nitroGLYCERIN (NITROSTAT) 0.4 MG SL tablet [id number]  Place  1 tablet (0.4 mg total) under the tongue every 5 (five) minutes x 3 doses as needed for chest pain. Theora Gianotti, NP  Active   omeprazole (PRILOSEC) 20 MG capsule [id number]  Take 20 mg by mouth in the morning.  [provider]  Active Spouse/Significant Other  Polyethyl Glycol-Propyl Glycol (SYSTANE OP) [id number]  Place 1 drop into both eyes 3 (three) times daily as needed (dry/irritated eyes.).  [provider]  Active Spouse/Significant  Other  polyethylene glycol (MIRALAX / GLYCOLAX) 17 g packet [id number]  Take 17 g by mouth daily as needed for mild constipation.  [provider]  Active Spouse/Significant Other  potassium chloride (KLOR-CON) 10 MEQ tablet [id number] Yes Take 1 tablet (10 mEq total) by mouth daily. [name], DO Taking Active   ranolazine (RANEXA) 500 MG 12 hr tablet [id number] Yes TAKE ONE TABLET BY MOUTH TWICE DAILY End, [name], MD Taking Active   rosuvastatin (CRESTOR) 5 MG tablet [id number]  TAKE ONE TABLET BY MOUTH ONCE DAILY End, [name], MD  Active   tamsulosin (FLOMAX) 0.4 MG CAPS capsule [id number]  Take 1 capsule (0.4 mg total) by mouth daily after supper. Rise Mu, PA-C  Active   torsemide (DEMADEX) 20 MG tablet [id number] Yes Take 3 tablets (60 mg total) by mouth daily. Bensimhon, Shaune Pascal, MD Taking Active            Med Note Bretta Bang Jan 22, 2021  3:18 PM)    traMADol (ULTRAM) 50 MG tablet [id number]  TAKE 1 OR 2 TABLETS BY MOUTH AT BEDTIME AS NEEDED [name], DO  Active   TRULICITY 1.5 BD/5.3GD SOPN [id number] Yes Inject 1.5 mg into the skin once a week. [name], DO Taking Active            Med Note [facility], BRANDY L   Thu Jun 29, 2020  1:45 PM)    warfarin (COUMADIN) 3 MG tablet [id number]  TAKE 1 TO 1 & 1/2 TABLETS BY MOUTH AS DIRECTED BY THE COUMADIN CLINIC End, [name], MD  Active             Pertinent Labs:  Lab Results  Component Value Date   HGBA1C 6.2 (A) 10/16/2020   Lab Results  Component Value Date   CHOL 123 06/29/2020   HDL 29 (L) 06/29/2020   LDLCALC 65 06/29/2020   TRIG 170 (H) 06/29/2020   CHOLHDL 4.2 06/29/2020   Lab Results  Component Value Date   CREATININE 1.80 (H) 02/01/2021   BUN 25 (H) 02/01/2021   NA 135 02/01/2021   K 3.8 02/01/2021   CL 100 02/01/2021   CO2 24 02/01/2021   BP Readings from Last 3 Encounters:  02/01/21 110/64  12/11/20 (!) 148/75  11/23/20  (!) 132/56   Pulse Readings from Last 3 Encounters:  02/01/21 81  12/11/20 79  11/23/20 76    SDOH:  (Social Determinants of Health) assessments and interventions performed:    [place]  Review of patient past medical history, allergies, medications, health status, including review of consultants reports, laboratory and other test data, was performed as part of comprehensive evaluation and provision of chronic care management services.   Care Plan : PharmD - Medication Management/Assistance  Updates made by Rennis Petty, RPH-CPP since 02/05/2021 12:00 AM     Problem: Disease Progression      Long-Range Goal: Disease Progression Prevented or Minimized   Start Date: 04/14/2020  Expected End Date: 07/13/2020  Recent Progress: On track  Priority: High  Note:   Current Barriers:  Chronic Disease Management support, education, and care coordination needs related to type 2 diabetes, CKD, HFrEF s/p CardioMEMS, hyperlipidemia, atrial fibrillation, hypertension, coronary artery disease, CHF, hypothyroidism, and CML Financial Note patient has Partial Extra Help/LIS Subsidy Patient enrolled in [employer] health practitioner and [employer]) patient assistance program for 2022 calendar year Patient denied for patient assistance for [place] from [employer] for 2022 calendar year Patient enrolled in [place] for [place] copayment assistance (eligibility through 08/28/2021) Limited vision  Pharmacist Clinical Goal(s):  Over the next 90 days, patient will verbalize ability to afford treatment regimen through collaboration with PharmD and provider.   Interventions: 1:1 collaboration with [name], DO regarding development and update of comprehensive plan of care as evidenced by provider attestation and co-signature Inter-disciplinary care team collaboration (see longitudinal plan of care) Perform chart review. Office Visit with [facility] in [place] on 11/17 Reports patient continues to take torsemide 20 mg - 3 tablets (60 mg) QAM as directed by Cardiologist Confirms patient plans to attend upcoming appointment with [facility] on 11/30 and will bring patient's medications with him to appointment Note patient has CardioMEMS monitoring Confirms picked up and patient now taking gabapentin 300 mg three times daily as renally adjusted by Neurologist Identify patient in need of refill of potassium chloride and Jardiance Rxs. Reports will call pharmacy today to have providers faxed for renewal  Type 2 Diabetes: Controlled; current treatment: [name] 18 units daily Trulicity 1.5 mg once weekly Jardiance 10 mg once daily in morning Patient reports recent fasting blood sugars ranging: 130s-140s; this morning: 147 Denies hypoglycemia Have encouraged patient to have regular well-balanced meals  Medication Assistance: Confirms patient has a sufficient supply of [name] and Trulicity to last until end of calendar year Patient interested in re-enrollment in [place] patient assistance program for [employer] and [employer] for 8786 calendar year Reports received application from, completed and mailed back to [place] on 11/19  Patient Goals/Self-Care Activities Over the next 90 days, patient will:  Check blood sugar, blood pressure and fluid status as directed by providers  Take medications, with assistance of wife, as directed Mrs. Tolin manages patient's medications using weekly pillbox as adherence tool Attends scheduled medical appointments Next appointment with [facility] on 11/30 Next appointment with PCP on 12/28  Follow Up Plan: Telephone follow up appointment with care management team member scheduled for: 1/16 at 10:15 am       [name], PharmD, Para [place], [place] [phone]

## 2021-02-05 NOTE — Telephone Encounter (Signed)
Copied from [place] [id number]. Topic: Quick Communication - Rx Refill/Question >> Feb 05, 2021 11:00 AM Tessa Lerner A wrote: Medication: potassium chloride (KLOR-CON) 10 MEQ tablet [[id number]]   empagliflozin (JARDIANCE) 10 MG TABS tablet [[id number]]   Has the patient contacted their pharmacy? No. The patient's wife was uncertain of the medication's status.  (Agent: If no, request that the patient contact the pharmacy for the refill. If patient does not wish to contact the pharmacy document the reason why and proceed with request.) (Agent: If yes, when and what did the pharmacy advise?)  Preferred Pharmacy (with phone number or street name): [place] [place]  Phone:  [phone] Fax:  [phone]   Has the patient been seen for an appointment in the last year OR does the patient have an upcoming appointment? Yes.    Agent: Please be advised that RX refills may take up to 3 business days. We ask that you follow-up with your pharmacy.

## 2021-02-05 NOTE — Patient Instructions (Signed)
Visit Information  Thank you for taking time to visit with me today. Please don't hesitate to contact me if I can be of assistance to you before our next scheduled telephone appointment.  Following are the goals we discussed today:  Please check your blood sugar, keep a log of the results and bring this with you to your medical appointments.  Please check your home blood pressure, keep a log of the results and bring this with you to your medical appointments.  Our goal bad cholesterol, or LDL, is less than 70 . This is why it is important to continue taking your rosuvastatin.  Feel free to call me with any questions or concerns. I look forward to our next call!  [name], PharmD, [place] [phone]  Our next appointment is by telephone on 1/16 at 10:15 am  Please call the care guide team at [phone] if you need to cancel or reschedule your appointment.    The patient verbalized understanding of instructions, educational materials, and care plan provided today and declined offer to receive copy of patient instructions, educational materials, and care plan.

## 2021-02-06 NOTE — Telephone Encounter (Signed)
Called and spoke with pt's spouse [name] to see if pt had received a cpap machine yet and she said that pt still has yet to receive one. Stated to her the message received from [facility] and stated to her that pt is on the list for [employer] to call to receive a machine. [name] verbalized understanding. Stated to her once pt did receive the machine to call the office so we could get pt scheduled for an appt. Nothing further needed.

## 2021-02-13 ENCOUNTER — Telehealth: Payer: Self-pay | Admitting: Pharmacy Technician

## 2021-02-13 DIAGNOSIS — Z596 Low income: Secondary | ICD-10-CM

## 2021-02-13 NOTE — Progress Notes (Signed)
[facility]                                            [place] Team    02/13/2021  Lucas Caldwell [birth year] [id number]  Received both patient and provider portion(s) of patient assistance application(s) for [employer] and Trulicity. Faxed completed application and required documents into [employer].    [name] P. [name], [place]  [phone]

## 2021-02-14 ENCOUNTER — Encounter: Payer: Self-pay | Admitting: Nurse Practitioner

## 2021-02-14 ENCOUNTER — Other Ambulatory Visit: Payer: Self-pay | Admitting: Physician Assistant

## 2021-02-14 ENCOUNTER — Other Ambulatory Visit: Payer: Self-pay

## 2021-02-14 ENCOUNTER — Ambulatory Visit (INDEPENDENT_AMBULATORY_CARE_PROVIDER_SITE_OTHER): Payer: Medicare HMO | Admitting: Nurse Practitioner

## 2021-02-14 VITALS — BP 110/62 | HR 93 | Ht 63.0 in | Wt 197.0 lb

## 2021-02-14 DIAGNOSIS — I25118 Atherosclerotic heart disease of native coronary artery with other forms of angina pectoris: Secondary | ICD-10-CM

## 2021-02-14 DIAGNOSIS — Z87891 Personal history of nicotine dependence: Secondary | ICD-10-CM | POA: Diagnosis not present

## 2021-02-14 DIAGNOSIS — I2510 Atherosclerotic heart disease of native coronary artery without angina pectoris: Secondary | ICD-10-CM

## 2021-02-14 DIAGNOSIS — Z7985 Long-term (current) use of injectable non-insulin antidiabetic drugs: Secondary | ICD-10-CM

## 2021-02-14 DIAGNOSIS — N184 Chronic kidney disease, stage 4 (severe): Secondary | ICD-10-CM

## 2021-02-14 DIAGNOSIS — M12811 Other specific arthropathies, not elsewhere classified, right shoulder: Secondary | ICD-10-CM

## 2021-02-14 DIAGNOSIS — E1122 Type 2 diabetes mellitus with diabetic chronic kidney disease: Secondary | ICD-10-CM

## 2021-02-14 DIAGNOSIS — E1121 Type 2 diabetes mellitus with diabetic nephropathy: Secondary | ICD-10-CM | POA: Diagnosis not present

## 2021-02-14 DIAGNOSIS — I951 Orthostatic hypotension: Secondary | ICD-10-CM

## 2021-02-14 DIAGNOSIS — M25512 Pain in left shoulder: Secondary | ICD-10-CM | POA: Diagnosis not present

## 2021-02-14 DIAGNOSIS — Z794 Long term (current) use of insulin: Secondary | ICD-10-CM | POA: Diagnosis not present

## 2021-02-14 DIAGNOSIS — I130 Hypertensive heart and chronic kidney disease with heart failure and stage 1 through stage 4 chronic kidney disease, or unspecified chronic kidney disease: Secondary | ICD-10-CM

## 2021-02-14 DIAGNOSIS — E1169 Type 2 diabetes mellitus with other specified complication: Secondary | ICD-10-CM | POA: Diagnosis not present

## 2021-02-14 DIAGNOSIS — E785 Hyperlipidemia, unspecified: Secondary | ICD-10-CM

## 2021-02-14 DIAGNOSIS — M12812 Other specific arthropathies, not elsewhere classified, left shoulder: Secondary | ICD-10-CM | POA: Diagnosis not present

## 2021-02-14 DIAGNOSIS — J432 Centrilobular emphysema: Secondary | ICD-10-CM

## 2021-02-14 DIAGNOSIS — N1831 Chronic kidney disease, stage 3a: Secondary | ICD-10-CM

## 2021-02-14 DIAGNOSIS — E1142 Type 2 diabetes mellitus with diabetic polyneuropathy: Secondary | ICD-10-CM

## 2021-02-14 DIAGNOSIS — I5022 Chronic systolic (congestive) heart failure: Secondary | ICD-10-CM

## 2021-02-14 DIAGNOSIS — I5032 Chronic diastolic (congestive) heart failure: Secondary | ICD-10-CM

## 2021-02-14 DIAGNOSIS — I480 Paroxysmal atrial fibrillation: Secondary | ICD-10-CM

## 2021-02-14 DIAGNOSIS — Z7984 Long term (current) use of oral hypoglycemic drugs: Secondary | ICD-10-CM

## 2021-02-14 DIAGNOSIS — M25511 Pain in right shoulder: Secondary | ICD-10-CM | POA: Diagnosis not present

## 2021-02-14 MED ORDER — NITROGLYCERIN 0.4 MG SL SUBL: 0.4000 mg | SUBLINGUAL_TABLET | SUBLINGUAL | 1 refills | Status: DC | PRN

## 2021-02-14 MED ORDER — ISOSORBIDE MONONITRATE ER 60 MG PO TB24: 30.0000 mg | ORAL_TABLET | Freq: Every day | ORAL | 0 refills | Status: DC

## 2021-02-14 NOTE — Patient Instructions (Signed)
Medication Instructions:   Your physician has recommended you make the following change in your medication:   REDUCE your Imdur to 30 MG once a day. (Cut your 60 MG tablet in half)  *If you need a refill on your cardiac medications before your next appointment, please call your pharmacy*   Lab Work: None ordered If you have labs (blood work) drawn today and your tests are completely normal, you will receive your results only by: [place] (if you have MyChart) OR A paper copy in the mail If you have any lab test that is abnormal or we need to change your treatment, we will call you to review the results.   Testing/Procedures: None ordered   Follow-Up: At [facility], you and your health needs are our priority.  As part of our continuing mission to provide you with exceptional heart care, we have created designated Provider Care Teams.  These Care Teams include your primary Cardiologist (physician) and Advanced Practice Providers (APPs -  Physician Assistants and Nurse Practitioners) who all work together to provide you with the care you need, when you need it.  We recommend signing up for the patient portal called "MyChart".  Sign up information is provided on this After Visit Summary.  MyChart is used to connect with patients for Virtual Visits (Telemedicine).  Patients are able to view lab/test results, encounter notes, upcoming appointments, etc.  Non-urgent messages can be sent to your provider as well.   To learn more about what you can do with MyChart, go to [email or web address].    Your next appointment:   3 month(s)  The format for your next appointment:   In Person  Provider:   You may see [name], MD or one of the following Advanced Practice Providers on your designated Care Team:   [name], NP [name], PA-C Cadence Kathlen Mody, [place]    Other Instructions

## 2021-02-14 NOTE — Progress Notes (Signed)
Office Visit    Patient Name: Lucas Caldwell Date of Encounter: 02/14/2021  Primary Care Provider:  [name], DO Primary Cardiologist:  [name], MD  Chief Complaint    87 y/o ? with a history of CAD status post prior RCA, circumflex, and LAD stents, ischemic cardiomyopathy, chronic combined systolic and diastolic congestive heart failure, hypertension, hyperlipidemia, diabetes, paroxysmal atrial fibrillation (on warfarin), stage III-IV chronic kidney disease, CML, iron deficiency anemia, GI bleed due to duodenal ulcers (October 2019), BPH, hypothyroidism, and peripheral neuropathy with unsteady gait, who presents for follow-up of CAD and heart failure.  Past Medical History    Past Medical History:  Diagnosis Date   Arthritis    "probably in his legs" (08/15/2016)   Blind right eye    BPH (benign prostatic hyperplasia)    Breast asymmetry    Left breast is larger, present for several years.   CAD (coronary artery disease)    a. Cath in the late 90's - reportedly ok;  b. 2014 s/p stenting x 2 @ [facility]; c 08/19/16 Cath/PCI with DES -> RCA, plan to treat LM 70% medically. Seen by surgery and felt to be too high risk for CABG; d. 08/2018 Cath: LM 70 (iFR 0.86), LAD 20ost, 40p, 50/75m, D1 70, LCX patent stent, OM1 30, RCA patent stent, 94m ISR, 40d, RPDA 30. PCWP 8. CO/CI 4.0/2.1; e. 03/2019 PCI to LAD (2.5x15 Resolute Onyx DES).   Chronic combined systolic (congestive) and diastolic (congestive) heart failure ([place])    a. Previously reduced EF-->50% by echo in 2012;  b. 06/2015 Echo: EF 50-55%  c. 07/2016 Echo: EF 45-50%; d. 12/2017 Echo: EF 55%; e. 08/2018 Echo: EF 35-40%; f. 04/2019 Echo: EF 30-35%; g. s/p cardiomems; h. 10/2020 Echo: EF 30-35%, mod-sev glob HK, gr1 DD.   Chronic kidney disease (CKD), stage IV (severe) (HCC)    CML (chronic myelocytic leukemia) (HCC)    GERD (gastroesophageal reflux disease)    GIB (gastrointestinal bleeding)    a. 12/2017 3 unit PRBC  GIB in setting of coumadin-->Endo/colon multiple duodenal ulcers and a single bleeding ulcer in the proximal ascending colon status post hemostatic clipping x2.   Gout    Hyperlipemia    Hypertension    Hypothyroidism    Iron deficiency anemia    Ischemic cardiomyopathy    a. Previously reduced EF-->50% by echo in 2012;  b. 06/2015 Echo: EF 50-55%, Gr2 DD;  c. 07/2016 Echo: EF 45-50%; d. 12/2017 Echo: EF 55%, Gr1 DD, mild MR; e. 08/2018 Echo: EF 35-40%; f. 04/2019 Echo: EF 30-35%; g. 10/2020 Echo: EF 30-35%.   Migraine    "in the 1960s" (08/15/2016)   Obstructive sleep apnea    Orthostatic hypotension    a. 20222 -->carvedilol and hydralazine d/c'd.   PAF (paroxysmal atrial fibrillation) (HCC)    a. ? Dx 2014-->s/p DCCV;  b. CHA2DS2VASc = 6--> Coumadin.   Prostate cancer ([place])    RBBB    Type II diabetes mellitus ([place])    Past Surgical History:  Procedure Laterality Date   CATARACT EXTRACTION W/ INTRAOCULAR LENS  IMPLANT, BILATERAL Bilateral    COLONOSCOPY N/A 01/13/2018   Procedure: COLONOSCOPY;  Surgeon: Lin Landsman, MD;  Location: [facility] ENDOSCOPY;  Service: Gastroenterology;  Laterality: N/A;   COLONOSCOPY WITH PROPOFOL N/A 01/01/2018   Procedure: COLONOSCOPY WITH PROPOFOL;  Surgeon: Lin Landsman, MD;  Location: [facility] ENDOSCOPY;  Service: Gastroenterology;  Laterality: N/A;   CORONARY ANGIOPLASTY WITH STENT PLACEMENT  09/2012   2 stents   CORONARY STENT INTERVENTION N/A 08/19/2016   Procedure: Coronary Stent Intervention;  Surgeon: [name], MD;  Location: [place] CV LAB;  Service: Cardiovascular;  Laterality: N/A;   CORONARY STENT INTERVENTION N/A 04/13/2019   Procedure: CORONARY STENT INTERVENTION;  Surgeon: [name], MD;  Location: [place] CV LAB;  Service: Cardiovascular;  Laterality: N/A;   ESOPHAGEAL MANOMETRY N/A 12/16/2017   Procedure: ESOPHAGEAL MANOMETRY (EM);  Surgeon: Lin Landsman, MD;  Location: [facility] ENDOSCOPY;  Service:  Gastroenterology;  Laterality: N/A;   ESOPHAGOGASTRODUODENOSCOPY N/A 12/20/2016   Procedure: ESOPHAGOGASTRODUODENOSCOPY (EGD);  Surgeon: Lin Landsman, MD;  Location: [facility] ENDOSCOPY;  Service: Gastroenterology;  Laterality: N/A;   ESOPHAGOGASTRODUODENOSCOPY N/A 01/13/2018   Procedure: ESOPHAGOGASTRODUODENOSCOPY (EGD);  Surgeon: Lin Landsman, MD;  Location: [facility] ENDOSCOPY;  Service: Gastroenterology;  Laterality: N/A;   ESOPHAGOGASTRODUODENOSCOPY (EGD) WITH PROPOFOL N/A 11/03/2017   Procedure: ESOPHAGOGASTRODUODENOSCOPY (EGD) WITH PROPOFOL;  Surgeon: Lin Landsman, MD;  Location: [facility] ENDOSCOPY;  Service: Gastroenterology;  Laterality: N/A;   EYE SURGERY     HERNIA REPAIR     "navel"   LEFT HEART CATH AND CORONARY ANGIOGRAPHY N/A 08/14/2016   Procedure: Left Heart Cath and Coronary Angiography;  Surgeon: [name], MD;  Location: [place] CV LAB;  Service: Cardiovascular;  Laterality: N/A;   PRESSURE SENSOR/CARDIOMEMS N/A 09/23/2019   Procedure: PRESSURE SENSOR/CARDIOMEMS;  Surgeon: Jolaine Artist, MD;  Location: [place] CV LAB;  Service: Cardiovascular;  Laterality: N/A;   RIGHT HEART CATH N/A 09/23/2019   Procedure: RIGHT HEART CATH;  Surgeon: Jolaine Artist, MD;  Location: [place] CV LAB;  Service: Cardiovascular;  Laterality: N/A;   RIGHT/LEFT HEART CATH AND CORONARY ANGIOGRAPHY N/A 09/01/2018   Procedure: RIGHT/LEFT HEART CATH AND CORONARY ANGIOGRAPHY;  Surgeon: [name], MD;  Location: [place] CV LAB;  Service: Cardiovascular;  Laterality: N/A;   RIGHT/LEFT HEART CATH AND CORONARY ANGIOGRAPHY N/A 04/13/2019   Procedure: RIGHT/LEFT HEART CATH AND CORONARY ANGIOGRAPHY;  Surgeon: [name], MD;  Location: [place] CV LAB;  Service: Cardiovascular;  Laterality: N/A;   TOE AMPUTATION Right    "big toe"    Allergies  Allergies  Allergen Reactions   Clopidogrel Anaphylaxis and Other (See Comments)    altered mental status;   (electrolytes "out of wack" and confusion per family; THEY DO NOT REMEMBER ANY OTHER REACTION)- MSB 10/10/15   Ciprofloxacin Itching   Beta Adrenergic Blockers     Nightmares   Mirtazapine Diarrhea    Bad dreams    Spironolactone Other (See Comments)    gynecomastia    History of Present Illness    87 year old ? with a history of CAD status post prior RCA, circumflex, and LAD stents, ischemic cardiomyopathy, chronic combined systolic and diastolic congestive heart failure, hypertension, hyperlipidemia, diabetes, paroxysmal atrial fibrillation (on warfarin), stage III-IV chronic kidney disease, CML, iron deficiency anemia, GI bleed due to duodenal ulcers (October 2019), BPH, hypothyroidism, and peripheral neuropathy with unsteady gait.  He previously underwent stenting of the right coronary artery in June 2018 was noted to have residual 70% left main disease.  He was evaluated by CT surgery at that time and felt not to be a suitable surgical candidate.  Due to ongoing symptoms, he underwent PCI of the LAD and January 2021.  EF has been variable over the years with a high of 55% by echocardiogram in 2019 with subsequent decline in EF to 30-35% by echo in February 2021.  In  July 2021, he underwent CardioMEMS placement and this has been followed by our advanced heart failure team in [place] since.  Earlier this year, he reported orthostatic lightheadedness.  He was also having nightmares.  Metoprolol was changed to carvedilol however, this seemed to worsen his symptoms and was subsequently discontinued in July.  Echocardiogram in August 2022 showed persistent LV dysfunction with an EF of 30 to 35%.  Due to ongoing orthostasis, hydralazine was discontinued in August 2022 and losartan was cut in half from 25 to 12.5 mg daily in late August.  Mr. Unangst was recently seen in heart failure clinic on November 17, at which time he reported stable symptoms.  CardioMEMS was reviewed and showed PA diastolic  pressures of less than 15.  He was advised to continue torsemide 60 mg daily.  Today, Mr. Mclinden reports chronic, stable dyspnea on exertion.  He also continues to experience orthostatic lightheadedness, especially when tying his shoes or putting his socks on, and then sitting or standing up.  His wife says he gets short of breath with minimal activity however, Mr. Riese is compared to 6 months ago his breathing is much improved.  [name] still able to chop wood without significant limitations.  He does not experience chest pain.  He denies palpitations, PND, orthopnea, syncope, edema, or early satiety.  Home Medications    Current Outpatient Medications  Medication Sig Dispense Refill   acetaminophen (TYLENOL) 500 MG tablet Take 1,000 mg by mouth every 6 (six) hours as needed for moderate pain or headache.      Alcohol Swabs (B-D SINGLE USE SWABS REGULAR) PADS Use to check blood sugar up to 2 times daily 200 each 3   allopurinol (ZYLOPRIM) 100 MG tablet TAKE ONE TABLET BY MOUTH ONCE DAILY 90 tablet 0   aspirin 81 MG chewable tablet Chew 81 mg by mouth daily.     Blood Glucose Monitoring Suppl (TRUE METRIX METER) DEVI Use to check blood sugar up to 2 times daily 1 each 0   bosutinib (BOSULIF) 100 MG tablet Take 100 mg by mouth at bedtime. Take with food.     Cholecalciferol 25 MCG (1000 UT) capsule Take 1,000 Units by mouth daily.     gabapentin (NEURONTIN) 600 MG tablet Take 600 mg by mouth 3 (three) times daily.     Insulin Glargine ([name] KWIKPEN) 100 UNIT/ML Inject 18 Units into the skin daily.     Insulin Pen Needle 31G X 5 MM MISC 31 g by Does not apply route as directed. 100 each 5   JARDIANCE 10 MG TABS tablet Take 1 tablet (10 mg total) by mouth daily with breakfast. 90 tablet 1   levothyroxine (SYNTHROID) 25 MCG tablet TAKE ONE TABLET BY MOUTH EVERY MORNING BEFORE BREAKFAST 90 tablet 1   losartan (COZAAR) 25 MG tablet Take 25 mg by mouth daily.     Menthol-Zinc Oxide (GOLD BOND EX)  Apply 1 application topically daily as needed (muscle pain).     Multiple Vitamin (MULTIVITAMIN WITH MINERALS) TABS tablet Take 1 tablet by mouth daily.     omeprazole (PRILOSEC) 20 MG capsule Take 20 mg by mouth in the morning.      Polyethyl Glycol-Propyl Glycol (SYSTANE OP) Place 1 drop into both eyes 3 (three) times daily as needed (dry/irritated eyes.).      polyethylene glycol (MIRALAX / GLYCOLAX) 17 g packet Take 17 g by mouth daily as needed for mild constipation.      potassium chloride (KLOR-CON)  10 MEQ tablet Take 1 tablet (10 mEq total) by mouth daily. 90 tablet 1   ranolazine (RANEXA) 500 MG 12 hr tablet TAKE ONE TABLET BY MOUTH TWICE DAILY 180 tablet 2   rosuvastatin (CRESTOR) 5 MG tablet TAKE ONE TABLET BY MOUTH ONCE DAILY 90 tablet 0   tamsulosin (FLOMAX) 0.4 MG CAPS capsule Take 1 capsule (0.4 mg total) by mouth daily after supper. 30 capsule 11   torsemide (DEMADEX) 20 MG tablet Take 3 tablets (60 mg total) by mouth daily. 90 tablet 6   traMADol (ULTRAM) 50 MG tablet TAKE 1 OR 2 TABLETS BY MOUTH AT BEDTIME AS NEEDED 30 tablet 2   TRULICITY 1.5 SL/3.7DS SOPN Inject 1.5 mg into the skin once a week. 6 mL 3   warfarin (COUMADIN) 3 MG tablet TAKE 1 TO 1 & 1/2 TABLETS BY MOUTH AS DIRECTED BY THE COUMADIN CLINIC 120 tablet 0   isosorbide mononitrate (IMDUR) 60 MG 24 hr tablet Take 0.5 tablets (30 mg total) by mouth daily. 90 tablet 0   nitroGLYCERIN (NITROSTAT) 0.4 MG SL tablet Place 1 tablet (0.4 mg total) under the tongue every 5 (five) minutes x 3 doses as needed for chest pain. 25 tablet 1   No current facility-administered medications for this visit.    Review of Systems    Chronic, stable dyspnea on exertion.  He continues to experience orthostatic lightheadedness.  He denies chest pain, palpitations, PND, orthopnea, syncope, edema, or early satiety.  All other systems reviewed and are otherwise negative except as noted above.   Physical Exam    VS:  BP 110/62 (BP Location:  Left Arm, Patient Position: Sitting, Cuff Size: Large)   Pulse 93   Ht 5\' 3"  (1.6 m)   Wt 197 lb (89.4 kg)   SpO2 97%   BMI 34.90 kg/m  , BMI Body mass index is 34.9 kg/m.     GEN: Well nourished, well developed, in no acute distress. HEENT: normal. Neck: Supple, no JVD, carotid bruits, or masses. Cardiac: RRR, no murmurs, rubs, or gallops. No clubbing, cyanosis, edema.  Radials/PT 2+ and equal bilaterally.  Respiratory:  Respirations regular and unlabored, clear to auscultation bilaterally. GI: Soft, nontender, nondistended, BS + x 4. MS: no deformity or atrophy. Skin: warm and dry, no rash. Neuro:  Strength and sensation are intact. Psych: Normal affect.  Accessory Clinical Findings    ECG personally reviewed by me today -regular sinus rhythm, 93, left axis deviation, left anterior fascicular block, right bundle branch block (bifascicular block)- no acute changes.  Lab Results  Component Value Date   WBC 13.6 (H) 08/16/2020   HGB 12.3 (L) 08/16/2020   HCT 37.3 (L) 08/16/2020   MCV 87.6 08/16/2020   PLT 168 08/16/2020   Lab Results  Component Value Date   CREATININE 1.80 (H) 02/01/2021   BUN 25 (H) 02/01/2021   NA 135 02/01/2021   K 3.8 02/01/2021   CL 100 02/01/2021   CO2 24 02/01/2021   Lab Results  Component Value Date   ALT 13 08/16/2020   AST 21 08/16/2020   ALKPHOS 53 08/16/2020   BILITOT 0.8 08/16/2020   Lab Results  Component Value Date   CHOL 123 06/29/2020   HDL 29 (L) 06/29/2020   LDLCALC 65 06/29/2020   TRIG 170 (H) 06/29/2020   CHOLHDL 4.2 06/29/2020    Lab Results  Component Value Date   HGBA1C 6.2 (A) 10/16/2020    Assessment & Plan    1.  Chronic heart failure with reduced ejection fraction/ischemic cardiomyopathy: EF 30 to 35% in August 2022 with moderate to severe global hypokinesis, grade 1 diastolic dysfunction.  Recent evaluation of CardioMEMS showed PA diastolic pressures consistently less than 15.  [name] chronic dyspnea on  exertion, which he notes is slightly improved over the past 6 months but remains bothersome.  He does not experience chest pain.  [name] euvolemic on examination today and his weight has been stable at home.  He continues to struggle with orthostatic lightheadedness despite prior reduction of losartan to 12.5 mg daily, and discontinuation of hydralazine earlier this year.  I am going to reduce his isosorbide mononitrate to 30 mg daily.  He otherwise remains on torsemide and empagliflozin.  [name] no longer on beta-blocker therapy secondary to severe nightmares, though he has not noticed any difference in frequency of nightmares after stopping.  That said, blood pressure too soft to resume.  2.  Coronary artery disease: Status post prior circumflex followed by RCA and most recently LAD stenting.  He does not experience chest pain.  He has chronic, stable dyspnea on exertion.  He remains on statin and aspirin therapy.  As noted above, reducing long-acting nitrate to 30 mg daily in the setting of ongoing orthostatic symptoms.  No aspirin in the setting of chronic warfarin.  3.  Orthostatic hypotension: This is been ongoing despite prior discontinuation of hydralazine and reduction of losartan to 12.5 mg daily.  Going to reduce his Imdur to 30 mg once a day.    4.  Stage III-IV chronic kidney disease: Creatinine stable at 1.8 November 17.  He remains on low-dose losartan and dapagliflozin therapy.  5.  Paroxysmal atrial fibrillation: Maintaining sinus rhythm.  As above, no longer on beta-blocker.  Anticoagulated with warfarin.  6.  Hyperlipidemia: LDL of 65 in April of this year with normal LFTs in June.  Continue statin therapy.  7.  Type 2 diabetes mellitus: A1c of 6.2 in August.  8.  Peripheral neuropathy: This is resulted in unsteady gait and he has been using his cane.  Followed by neurology.  9.  Disposition: He has follow-up in heart failure clinic in 6 months.  We will arrange for follow-up here in  3 months.   [name], NP 02/14/2021, 4:54 PM

## 2021-02-17 ENCOUNTER — Other Ambulatory Visit: Payer: Self-pay | Admitting: Family Medicine

## 2021-02-17 DIAGNOSIS — E1142 Type 2 diabetes mellitus with diabetic polyneuropathy: Secondary | ICD-10-CM

## 2021-02-18 NOTE — Telephone Encounter (Signed)
Requested medication (s) are due for refill today: yes  Requested medication (s) are on the active medication list: yes  Last refill:  10/23/20 #30 2 RF  Future visit scheduled: yes  Notes to clinic:  med not delegated to NT to RF   Requested Prescriptions  Pending Prescriptions Disp Refills   traMADol (ULTRAM) 50 MG tablet [Pharmacy Med Name: tramadol 50 mg tablet] 30 tablet 2    Sig: TAKE 1 OR 2 TABLETS BY MOUTH AT BEDTIME AS NEEDED     Not Delegated - Analgesics:  Opioid Agonists Failed - 02/17/2021 10:07 AM      Failed - This refill cannot be delegated      Failed - Urine Drug Screen completed in last 360 days      Passed - Valid encounter within last 6 months    Recent Outpatient Visits           2 months ago Chronic pain of both shoulders   [place], DO   2 months ago Controlled type 2 diabetes mellitus with diabetic nephropathy, with long-term current use of insulin ([place])   [facility], [name], DO   4 months ago Controlled type 2 diabetes mellitus with diabetic nephropathy, with long-term current use of insulin [facility])   [place], DO   6 months ago Acute diastolic congestive heart failure [facility])   [place], DO   6 months ago Acute non-recurrent frontal sinusitis   [place], [name], DO       Future Appointments             In 3 weeks Parks Ranger, [name], DO [facility], [place]   In 2 months Sharolyn Douglas, Clance Boll, NP [facility], [place]   In 6 months Debroah Loop, [facility]

## 2021-02-19 DIAGNOSIS — C9210 Chronic myeloid leukemia, BCR/ABL-positive, not having achieved remission: Principal | ICD-10-CM

## 2021-02-19 MED ORDER — BOSUTINIB 100 MG TABLET
ORAL_TABLET | Freq: Every day | ORAL | 11 refills | 30 days | Status: CP
Start: 2021-02-19 — End: ?

## 2021-02-21 ENCOUNTER — Emergency Department: Payer: Medicare HMO

## 2021-02-21 ENCOUNTER — Inpatient Hospital Stay
Admission: EM | Admit: 2021-02-21 | Discharge: 2021-02-24 | DRG: 617 | Disposition: A | Discharge status: Home Health | Payer: Medicare HMO | Source: Ambulatory Visit | Attending: Student in an Organized Health Care Education/Training Program | Admitting: Student in an Organized Health Care Education/Training Program

## 2021-02-21 ENCOUNTER — Other Ambulatory Visit: Payer: Self-pay

## 2021-02-21 ENCOUNTER — Encounter: Payer: Self-pay | Admitting: Emergency Medicine

## 2021-02-21 DIAGNOSIS — N400 Enlarged prostate without lower urinary tract symptoms: Secondary | ICD-10-CM | POA: Diagnosis present

## 2021-02-21 DIAGNOSIS — J432 Centrilobular emphysema: Secondary | ICD-10-CM | POA: Diagnosis present

## 2021-02-21 DIAGNOSIS — E11621 Type 2 diabetes mellitus with foot ulcer: Secondary | ICD-10-CM | POA: Diagnosis present

## 2021-02-21 DIAGNOSIS — L97509 Non-pressure chronic ulcer of other part of unspecified foot with unspecified severity: Secondary | ICD-10-CM | POA: Diagnosis present

## 2021-02-21 DIAGNOSIS — G4733 Obstructive sleep apnea (adult) (pediatric): Secondary | ICD-10-CM | POA: Diagnosis not present

## 2021-02-21 DIAGNOSIS — Z8546 Personal history of malignant neoplasm of prostate: Secondary | ICD-10-CM

## 2021-02-21 DIAGNOSIS — E1122 Type 2 diabetes mellitus with diabetic chronic kidney disease: Secondary | ICD-10-CM | POA: Diagnosis present

## 2021-02-21 DIAGNOSIS — I130 Hypertensive heart and chronic kidney disease with heart failure and stage 1 through stage 4 chronic kidney disease, or unspecified chronic kidney disease: Secondary | ICD-10-CM | POA: Diagnosis not present

## 2021-02-21 DIAGNOSIS — Z794 Long term (current) use of insulin: Secondary | ICD-10-CM | POA: Diagnosis not present

## 2021-02-21 DIAGNOSIS — M86071 Acute hematogenous osteomyelitis, right ankle and foot: Secondary | ICD-10-CM | POA: Diagnosis present

## 2021-02-21 DIAGNOSIS — E785 Hyperlipidemia, unspecified: Secondary | ICD-10-CM | POA: Diagnosis present

## 2021-02-21 DIAGNOSIS — E1169 Type 2 diabetes mellitus with other specified complication: Secondary | ICD-10-CM | POA: Diagnosis not present

## 2021-02-21 DIAGNOSIS — S98111A Complete traumatic amputation of right great toe, initial encounter: Secondary | ICD-10-CM | POA: Diagnosis not present

## 2021-02-21 DIAGNOSIS — M86171 Other acute osteomyelitis, right ankle and foot: Secondary | ICD-10-CM | POA: Diagnosis not present

## 2021-02-21 DIAGNOSIS — Z79899 Other long term (current) drug therapy: Secondary | ICD-10-CM

## 2021-02-21 DIAGNOSIS — M109 Gout, unspecified: Secondary | ICD-10-CM | POA: Diagnosis present

## 2021-02-21 DIAGNOSIS — I70235 Atherosclerosis of native arteries of right leg with ulceration of other part of foot: Secondary | ICD-10-CM | POA: Diagnosis not present

## 2021-02-21 DIAGNOSIS — I70203 Unspecified atherosclerosis of native arteries of extremities, bilateral legs: Secondary | ICD-10-CM | POA: Diagnosis not present

## 2021-02-21 DIAGNOSIS — Z808 Family history of malignant neoplasm of other organs or systems: Secondary | ICD-10-CM

## 2021-02-21 DIAGNOSIS — Z856 Personal history of leukemia: Secondary | ICD-10-CM | POA: Diagnosis not present

## 2021-02-21 DIAGNOSIS — M869 Osteomyelitis, unspecified: Secondary | ICD-10-CM

## 2021-02-21 DIAGNOSIS — B351 Tinea unguium: Secondary | ICD-10-CM | POA: Diagnosis not present

## 2021-02-21 DIAGNOSIS — K219 Gastro-esophageal reflux disease without esophagitis: Secondary | ICD-10-CM | POA: Diagnosis present

## 2021-02-21 DIAGNOSIS — Z7982 Long term (current) use of aspirin: Secondary | ICD-10-CM

## 2021-02-21 DIAGNOSIS — Z20822 Contact with and (suspected) exposure to covid-19: Secondary | ICD-10-CM | POA: Diagnosis not present

## 2021-02-21 DIAGNOSIS — L089 Local infection of the skin and subcutaneous tissue, unspecified: Secondary | ICD-10-CM

## 2021-02-21 DIAGNOSIS — Z8631 Personal history of diabetic foot ulcer: Secondary | ICD-10-CM | POA: Diagnosis not present

## 2021-02-21 DIAGNOSIS — M79671 Pain in right foot: Secondary | ICD-10-CM | POA: Diagnosis not present

## 2021-02-21 DIAGNOSIS — Z7984 Long term (current) use of oral hypoglycemic drugs: Secondary | ICD-10-CM

## 2021-02-21 DIAGNOSIS — N184 Chronic kidney disease, stage 4 (severe): Secondary | ICD-10-CM | POA: Diagnosis present

## 2021-02-21 DIAGNOSIS — L03119 Cellulitis of unspecified part of limb: Secondary | ICD-10-CM

## 2021-02-21 DIAGNOSIS — R451 Restlessness and agitation: Secondary | ICD-10-CM | POA: Diagnosis not present

## 2021-02-21 DIAGNOSIS — M2041 Other hammer toe(s) (acquired), right foot: Secondary | ICD-10-CM | POA: Diagnosis not present

## 2021-02-21 DIAGNOSIS — Z79890 Hormone replacement therapy (postmenopausal): Secondary | ICD-10-CM | POA: Diagnosis not present

## 2021-02-21 DIAGNOSIS — N185 Chronic kidney disease, stage 5: Secondary | ICD-10-CM | POA: Diagnosis not present

## 2021-02-21 DIAGNOSIS — E1121 Type 2 diabetes mellitus with diabetic nephropathy: Secondary | ICD-10-CM

## 2021-02-21 DIAGNOSIS — Z89411 Acquired absence of right great toe: Secondary | ICD-10-CM | POA: Diagnosis not present

## 2021-02-21 DIAGNOSIS — R600 Localized edema: Secondary | ICD-10-CM | POA: Diagnosis not present

## 2021-02-21 DIAGNOSIS — M8600 Acute hematogenous osteomyelitis, unspecified site: Secondary | ICD-10-CM

## 2021-02-21 DIAGNOSIS — I5042 Chronic combined systolic (congestive) and diastolic (congestive) heart failure: Secondary | ICD-10-CM | POA: Diagnosis not present

## 2021-02-21 DIAGNOSIS — I480 Paroxysmal atrial fibrillation: Secondary | ICD-10-CM | POA: Diagnosis not present

## 2021-02-21 DIAGNOSIS — Z833 Family history of diabetes mellitus: Secondary | ICD-10-CM

## 2021-02-21 DIAGNOSIS — E1142 Type 2 diabetes mellitus with diabetic polyneuropathy: Secondary | ICD-10-CM | POA: Diagnosis not present

## 2021-02-21 DIAGNOSIS — M216X1 Other acquired deformities of right foot: Secondary | ICD-10-CM | POA: Diagnosis not present

## 2021-02-21 DIAGNOSIS — Z87891 Personal history of nicotine dependence: Secondary | ICD-10-CM

## 2021-02-21 DIAGNOSIS — Z8249 Family history of ischemic heart disease and other diseases of the circulatory system: Secondary | ICD-10-CM

## 2021-02-21 DIAGNOSIS — I25118 Atherosclerotic heart disease of native coronary artery with other forms of angina pectoris: Secondary | ICD-10-CM | POA: Diagnosis present

## 2021-02-21 DIAGNOSIS — I10 Essential (primary) hypertension: Secondary | ICD-10-CM | POA: Diagnosis present

## 2021-02-21 DIAGNOSIS — E11628 Type 2 diabetes mellitus with other skin complications: Secondary | ICD-10-CM | POA: Diagnosis not present

## 2021-02-21 DIAGNOSIS — Z7901 Long term (current) use of anticoagulants: Secondary | ICD-10-CM

## 2021-02-21 DIAGNOSIS — E039 Hypothyroidism, unspecified: Secondary | ICD-10-CM | POA: Diagnosis not present

## 2021-02-21 DIAGNOSIS — L03031 Cellulitis of right toe: Secondary | ICD-10-CM | POA: Diagnosis not present

## 2021-02-21 DIAGNOSIS — L97519 Non-pressure chronic ulcer of other part of right foot with unspecified severity: Secondary | ICD-10-CM | POA: Diagnosis not present

## 2021-02-21 DIAGNOSIS — R410 Disorientation, unspecified: Secondary | ICD-10-CM | POA: Diagnosis not present

## 2021-02-21 DIAGNOSIS — E119 Type 2 diabetes mellitus without complications: Secondary | ICD-10-CM | POA: Diagnosis not present

## 2021-02-21 DIAGNOSIS — Z7985 Long-term (current) use of injectable non-insulin antidiabetic drugs: Secondary | ICD-10-CM

## 2021-02-21 DIAGNOSIS — Z888 Allergy status to other drugs, medicaments and biological substances status: Secondary | ICD-10-CM

## 2021-02-21 DIAGNOSIS — R001 Bradycardia, unspecified: Secondary | ICD-10-CM | POA: Diagnosis present

## 2021-02-21 DIAGNOSIS — I255 Ischemic cardiomyopathy: Secondary | ICD-10-CM | POA: Diagnosis present

## 2021-02-21 DIAGNOSIS — M2042 Other hammer toe(s) (acquired), left foot: Secondary | ICD-10-CM | POA: Diagnosis not present

## 2021-02-21 DIAGNOSIS — Z955 Presence of coronary angioplasty implant and graft: Secondary | ICD-10-CM

## 2021-02-21 DIAGNOSIS — M7989 Other specified soft tissue disorders: Secondary | ICD-10-CM | POA: Diagnosis not present

## 2021-02-21 DIAGNOSIS — L97514 Non-pressure chronic ulcer of other part of right foot with necrosis of bone: Secondary | ICD-10-CM | POA: Diagnosis not present

## 2021-02-21 DIAGNOSIS — L03115 Cellulitis of right lower limb: Secondary | ICD-10-CM | POA: Diagnosis not present

## 2021-02-21 DIAGNOSIS — Z89421 Acquired absence of other right toe(s): Secondary | ICD-10-CM | POA: Diagnosis not present

## 2021-02-21 LAB — CBC WITH DIFFERENTIAL/PLATELET
Abs Immature Granulocytes: 0.03 10*3/uL (ref 0.00–0.07)
Basophils Absolute: 0 10*3/uL (ref 0.0–0.1)
Basophils Relative: 0 %
Eosinophils Absolute: 0.1 10*3/uL (ref 0.0–0.5)
Eosinophils Relative: 1 %
HCT: 39.3 % (ref 39.0–52.0)
Hemoglobin: 12.8 g/dL — ABNORMAL LOW (ref 13.0–17.0)
Immature Granulocytes: 0 %
Lymphocytes Relative: 12 %
Lymphs Abs: 1.2 10*3/uL (ref 0.7–4.0)
MCH: 27.8 pg (ref 26.0–34.0)
MCHC: 32.6 g/dL (ref 30.0–36.0)
MCV: 85.4 fL (ref 80.0–100.0)
Monocytes Absolute: 1 10*3/uL (ref 0.1–1.0)
Monocytes Relative: 9 %
Neutro Abs: 8.1 10*3/uL — ABNORMAL HIGH (ref 1.7–7.7)
Neutrophils Relative %: 78 %
Platelets: 164 10*3/uL (ref 150–400)
RBC: 4.6 MIL/uL (ref 4.22–5.81)
RDW: 15.6 % — ABNORMAL HIGH (ref 11.5–15.5)
WBC: 10.5 10*3/uL (ref 4.0–10.5)
nRBC: 0 % (ref 0.0–0.2)

## 2021-02-21 LAB — URINALYSIS, ROUTINE W REFLEX MICROSCOPIC
Bacteria, UA: NONE SEEN
Bilirubin Urine: NEGATIVE
Glucose, UA: >1000 mg/dL — AB
Hgb urine dipstick: NEGATIVE
Ketones, ur: NEGATIVE mg/dL
Leukocytes,Ua: NEGATIVE
Nitrite: NEGATIVE
Protein, ur: NEGATIVE mg/dL
Specific Gravity, Urine: 1.015 (ref 1.005–1.030)
WBC, UA: NONE SEEN WBC/hpf (ref 0–5)
pH: 5.5 (ref 5.0–8.0)

## 2021-02-21 LAB — COMPREHENSIVE METABOLIC PANEL
ALT: 11 U/L (ref 0–44)
AST: 22 U/L (ref 15–41)
Albumin: 3.4 g/dL — ABNORMAL LOW (ref 3.5–5.0)
Alkaline Phosphatase: 60 U/L (ref 38–126)
Anion gap: 10 (ref 5–15)
BUN: 39 mg/dL — ABNORMAL HIGH (ref 8–23)
CO2: 23 mmol/L (ref 22–32)
Calcium: 8.8 mg/dL — ABNORMAL LOW (ref 8.9–10.3)
Chloride: 101 mmol/L (ref 98–111)
Creatinine, Ser: 1.83 mg/dL — ABNORMAL HIGH (ref 0.61–1.24)
GFR, Estimated: 36 mL/min — ABNORMAL LOW (ref >60)
Glucose, Bld: 212 mg/dL — ABNORMAL HIGH (ref 70–99)
Potassium: 3.9 mmol/L (ref 3.5–5.1)
Sodium: 134 mmol/L — ABNORMAL LOW (ref 135–145)
Total Bilirubin: 0.9 mg/dL (ref 0.3–1.2)
Total Protein: 8 g/dL (ref 6.5–8.1)

## 2021-02-21 LAB — CBC
HCT: 38.2 % — ABNORMAL LOW (ref 39.0–52.0)
Hemoglobin: 12.1 g/dL — ABNORMAL LOW (ref 13.0–17.0)
MCH: 27.3 pg (ref 26.0–34.0)
MCHC: 31.7 g/dL (ref 30.0–36.0)
MCV: 86.2 fL (ref 80.0–100.0)
Platelets: 167 10*3/uL (ref 150–400)
RBC: 4.43 MIL/uL (ref 4.22–5.81)
RDW: 15.4 % (ref 11.5–15.5)
WBC: 11.1 10*3/uL — ABNORMAL HIGH (ref 4.0–10.5)
nRBC: 0 % (ref 0.0–0.2)

## 2021-02-21 LAB — RESP PANEL BY RT-PCR (FLU A&B, COVID) ARPGX2
Influenza A by PCR: NEGATIVE
Influenza B by PCR: NEGATIVE
SARS Coronavirus 2 by RT PCR: NEGATIVE

## 2021-02-21 LAB — CREATININE, SERUM
Creatinine, Ser: 1.5 mg/dL — ABNORMAL HIGH (ref 0.61–1.24)
GFR, Estimated: 45 mL/min — ABNORMAL LOW (ref >60)

## 2021-02-21 LAB — CBG MONITORING, ED: Glucose-Capillary: 132 mg/dL — ABNORMAL HIGH (ref 70–99)

## 2021-02-21 LAB — LACTIC ACID, PLASMA
Lactic Acid, Venous: 1.6 mmol/L (ref 0.5–1.9)
Lactic Acid, Venous: 1.2 mmol/L (ref 0.5–1.9)

## 2021-02-21 LAB — PROTIME-INR
INR: 1.6 — ABNORMAL HIGH (ref 0.8–1.2)
Prothrombin Time: 19.3 seconds — ABNORMAL HIGH (ref 11.4–15.2)

## 2021-02-21 MED ORDER — VANCOMYCIN HCL IN DEXTROSE 1-5 GM/200ML-% IV SOLN
1000.0000 mg | Freq: Once | INTRAVENOUS | Status: AC
  Administered 2021-02-21: 1000 mg via INTRAVENOUS
  Filled 2021-02-21: qty 200

## 2021-02-21 MED ORDER — TAMSULOSIN HCL 0.4 MG PO CAPS
0.4000 mg | ORAL_CAPSULE | Freq: Every day | ORAL | Status: DC
  Administered 2021-02-22 – 2021-02-23 (×2): 0.4 mg via ORAL
  Filled 2021-02-21 (×2): qty 1

## 2021-02-21 MED ORDER — ALLOPURINOL 100 MG PO TABS
100.0000 mg | ORAL_TABLET | Freq: Every day | ORAL | Status: DC
  Administered 2021-02-22 – 2021-02-24 (×3): 100 mg via ORAL
  Filled 2021-02-21 (×4): qty 1

## 2021-02-21 MED ORDER — LOSARTAN POTASSIUM 25 MG PO TABS
25.0000 mg | ORAL_TABLET | Freq: Every day | ORAL | Status: DC
  Administered 2021-02-22 – 2021-02-24 (×3): 25 mg via ORAL
  Filled 2021-02-21 (×3): qty 1

## 2021-02-21 MED ORDER — PIPERACILLIN-TAZOBACTAM 3.375 G IVPB 30 MIN
3.3750 g | Freq: Once | INTRAVENOUS | Status: AC
  Administered 2021-02-21: 3.375 g via INTRAVENOUS
  Filled 2021-02-21: qty 50

## 2021-02-21 MED ORDER — VANCOMYCIN HCL 1250 MG/250ML IV SOLN: 1250.0000 mg | INTRAVENOUS | Status: DC

## 2021-02-21 MED ORDER — WARFARIN SODIUM 4 MG PO TABS
4.5000 mg | ORAL_TABLET | Freq: Once | ORAL | Status: AC
  Administered 2021-02-21: 4.5 mg via ORAL
  Filled 2021-02-21: qty 1

## 2021-02-21 MED ORDER — ASPIRIN 81 MG PO CHEW
81.0000 mg | CHEWABLE_TABLET | Freq: Every day | ORAL | Status: DC
  Administered 2021-02-22 – 2021-02-24 (×3): 81 mg via ORAL
  Filled 2021-02-21 (×3): qty 1

## 2021-02-21 MED ORDER — SODIUM CHLORIDE 0.9 % IV BOLUS
1000.0000 mL | Freq: Once | INTRAVENOUS | Status: AC
  Administered 2021-02-21: 1000 mL via INTRAVENOUS

## 2021-02-21 MED ORDER — HEPARIN SODIUM (PORCINE) 5000 UNIT/ML IJ SOLN
5000.0000 [IU] | Freq: Three times a day (TID) | INTRAMUSCULAR | Status: DC
  Administered 2021-02-21 – 2021-02-24 (×8): 5000 [IU] via SUBCUTANEOUS
  Filled 2021-02-21 (×8): qty 1

## 2021-02-21 MED ORDER — ACETAMINOPHEN 500 MG PO TABS
1000.0000 mg | ORAL_TABLET | Freq: Four times a day (QID) | ORAL | Status: DC | PRN
  Administered 2021-02-22: 19:00:00 1000 mg via ORAL
  Filled 2021-02-21 (×2): qty 2

## 2021-02-21 MED ORDER — WARFARIN - PHARMACIST DOSING INPATIENT
Freq: Every day | Status: DC
  Filled 2021-02-21: qty 1

## 2021-02-21 MED ORDER — ISOSORBIDE MONONITRATE ER 30 MG PO TB24
30.0000 mg | ORAL_TABLET | Freq: Every day | ORAL | Status: DC
  Administered 2021-02-22 – 2021-02-24 (×3): 30 mg via ORAL
  Filled 2021-02-21 (×3): qty 1

## 2021-02-21 MED ORDER — NITROGLYCERIN 0.4 MG SL SUBL: 0.4000 mg | SUBLINGUAL_TABLET | SUBLINGUAL | Status: DC | PRN

## 2021-02-21 MED ORDER — BOSUTINIB 100 MG PO TABS: 100.0000 mg | ORAL_TABLET | Freq: Every day | ORAL | Status: DC

## 2021-02-21 MED ORDER — HYDRALAZINE HCL 20 MG/ML IJ SOLN: 10.0000 mg | Freq: Four times a day (QID) | INTRAMUSCULAR | Status: DC | PRN

## 2021-02-21 MED ORDER — SODIUM CHLORIDE 0.9 % IV SOLN: INTRAVENOUS | Status: DC

## 2021-02-21 MED ORDER — INSULIN GLARGINE-YFGN 100 UNIT/ML ~~LOC~~ SOLN
18.0000 [IU] | Freq: Every day | SUBCUTANEOUS | Status: DC
  Administered 2021-02-21: 18 [IU] via SUBCUTANEOUS
  Filled 2021-02-21 (×2): qty 0.18

## 2021-02-21 MED ORDER — PIPERACILLIN-TAZOBACTAM 3.375 G IVPB
3.3750 g | Freq: Three times a day (TID) | INTRAVENOUS | Status: DC
  Administered 2021-02-22 – 2021-02-23 (×4): 3.375 g via INTRAVENOUS
  Filled 2021-02-21 (×4): qty 50

## 2021-02-21 MED ORDER — RANOLAZINE ER 500 MG PO TB12
500.0000 mg | ORAL_TABLET | Freq: Two times a day (BID) | ORAL | Status: DC
  Administered 2021-02-21 – 2021-02-24 (×5): 500 mg via ORAL
  Filled 2021-02-21 (×8): qty 1

## 2021-02-21 MED ORDER — ROSUVASTATIN CALCIUM 5 MG PO TABS
5.0000 mg | ORAL_TABLET | Freq: Every day | ORAL | Status: DC
  Administered 2021-02-22 – 2021-02-24 (×3): 5 mg via ORAL
  Filled 2021-02-21 (×4): qty 1

## 2021-02-21 MED ORDER — INSULIN ASPART 100 UNIT/ML IJ SOLN: 0.0000 [IU] | Freq: Three times a day (TID) | INTRAMUSCULAR | Status: DC

## 2021-02-21 MED ORDER — BASAGLAR KWIKPEN 100 UNIT/ML ~~LOC~~ SOPN
18.0000 [IU] | PEN_INJECTOR | Freq: Every day | SUBCUTANEOUS | Status: DC
Start: 2021-02-21 — End: 2021-02-21

## 2021-02-21 MED ORDER — LEVOTHYROXINE SODIUM 25 MCG PO TABS
25.0000 ug | ORAL_TABLET | Freq: Every day | ORAL | Status: DC
  Administered 2021-02-22 – 2021-02-24 (×2): 25 ug via ORAL
  Filled 2021-02-21 (×5): qty 1

## 2021-02-21 MED ORDER — TRAMADOL HCL 50 MG PO TABS
50.0000 mg | ORAL_TABLET | Freq: Two times a day (BID) | ORAL | Status: DC
  Administered 2021-02-22 – 2021-02-24 (×3): 100 mg via ORAL
  Filled 2021-02-21 (×6): qty 2

## 2021-02-21 NOTE — H&P (Signed)
History and Physical    Lucas Caldwell HCW:[id number] DOB: 11-27-1933 DOA: 02/21/2021  PCP: [name], DO    Patient coming from:  Home   Chief Complaint:  Diabetic foot infection.   HPI:  Lucas Caldwell is a 87 y.o. male seen in ed with complaints of a foot infection that started about 5 days ago, since sunday and involves the right great to and is now going up to his foot there is some redness and ascending streaking dorsal surface of his foot patient also reports warmth and feeling chills, patient went to see his podiatrist and was advised to to be admitted inpatient treatment for his foot infection.  Chart review shows podiatry note stating antibiotic debridement possible toe amputation, ulcer to the right partial hallux amputation site where there is exposed bone and need for vascular evaluation prior to amputation.  Patient's last cardiology note was on 14 February 2021 with Dr. [facility] [place].   Pt has past medical history of allergies to Plavix Cipro, beta-blockers, mirtazapine and Aldactone. Patient has past history of arthritis, blindness in the right eye, BPH, heart disease, chronic combined systolic and diastolic heart failure chronic kidney disease, CML, GERD, GI bleed, Hypertension, hyperlipidemia, hypothyroidism, iron deficiency anemia obstructive sleep apnea, history of migraines, PAF, orthostatic hypotension, GI bleed due to duodenal ulcer in 2019 October,Right bundle branch block, prostate cancer, diabetes mellitus type 2.  ED Course:  Vitals:   02/21/21 1519 02/21/21 1805 02/21/21 1830 02/21/21 1900  BP: 116/67 139/64 (!) 143/78 125/64  Pulse: 81 78  82  Resp: 18 18  17   Temp:      TempSrc:      SpO2: 98% 97%  97%  Weight:      Height:      In the emergency room patient is alert awake oriented afebrile oxygenating 97% on room air.  Vitals are stable.  Initial imaging with chest x-ray negative for any  fracture dislocation or signs of osteomyelitis.  Blood work shows, CKD with a creatinine of 1.83, hyperglycemia with a glucose of 212, normal LFTs otherwise, mild hyponatremia.,  Lactic of 1.6, CBC shows a normal white count of 10.5 hemoglobin of 12.8, RDW of 15.6 platelets of 164, patient does not meet SIRS criteria.  Cultures have been collected as patient had chills. In the emergency room patient given vancomycin and Zosyn.   Review of Systems:  Review of Systems  Constitutional:  Positive for chills and fever.  Respiratory:  Positive for shortness of breath.   Musculoskeletal:  Positive for joint pain.  All other systems reviewed and are negative.   Past Medical History:  Diagnosis Date   Arthritis    "probably in his legs" (08/15/2016)   Blind right eye    BPH (benign prostatic hyperplasia)    Breast asymmetry    Left breast is larger, present for several years.   CAD (coronary artery disease)    a. Cath in the late 90's - reportedly ok;  b. 2014 s/p stenting x 2 @ [facility]; c 08/19/16 Cath/PCI with DES -> RCA, plan to treat LM 70% medically. Seen by surgery and felt to be too high risk for CABG; d. 08/2018 Cath: LM 70 (iFR 0.86), LAD 20ost, 40p, 50/40m, D1 70, LCX patent stent, OM1 30, RCA patent stent, 29m ISR, 40d, RPDA 30. PCWP 8. CO/CI 4.0/2.1; e. 03/2019 PCI to LAD (2.5x15 Resolute Onyx DES).   Chronic combined systolic (congestive) and diastolic (  congestive) heart failure ([name])    a. Previously reduced EF-->50% by echo in 2012;  b. 06/2015 Echo: EF 50-55%  c. 07/2016 Echo: EF 45-50%; d. 12/2017 Echo: EF 55%; e. 08/2018 Echo: EF 35-40%; f. 04/2019 Echo: EF 30-35%; g. s/p cardiomems; h. 10/2020 Echo: EF 30-35%, mod-sev glob HK, gr1 DD.   Chronic kidney disease (CKD), stage IV (severe) (HCC)    CML (chronic myelocytic leukemia) (HCC)    GERD (gastroesophageal reflux disease)    GIB (gastrointestinal bleeding)    a. 12/2017 3 unit PRBC GIB in setting of coumadin-->Endo/colon multiple duodenal  ulcers and a single bleeding ulcer in the proximal ascending colon status post hemostatic clipping x2.   Gout    Hyperlipemia    Hypertension    Hypothyroidism    Iron deficiency anemia    Ischemic cardiomyopathy    a. Previously reduced EF-->50% by echo in 2012;  b. 06/2015 Echo: EF 50-55%, Gr2 DD;  c. 07/2016 Echo: EF 45-50%; d. 12/2017 Echo: EF 55%, Gr1 DD, mild MR; e. 08/2018 Echo: EF 35-40%; f. 04/2019 Echo: EF 30-35%; g. 10/2020 Echo: EF 30-35%.   Migraine    "in the 1960s" (08/15/2016)   Obstructive sleep apnea    Orthostatic hypotension    a. 20222 -->carvedilol and hydralazine d/c'd.   PAF (paroxysmal atrial fibrillation) (HCC)    a. ? Dx 2014-->s/p DCCV;  b. CHA2DS2VASc = 6--> Coumadin.   Prostate cancer ([place])    RBBB    Type II diabetes mellitus ([name])     Past Surgical History:  Procedure Laterality Date   CATARACT EXTRACTION W/ INTRAOCULAR LENS  IMPLANT, BILATERAL Bilateral    COLONOSCOPY N/A 01/13/2018   Procedure: COLONOSCOPY;  Surgeon: Lin Landsman, MD;  Location: [facility] ENDOSCOPY;  Service: Gastroenterology;  Laterality: N/A;   COLONOSCOPY WITH PROPOFOL N/A 01/01/2018   Procedure: COLONOSCOPY WITH PROPOFOL;  Surgeon: Lin Landsman, MD;  Location: [facility] ENDOSCOPY;  Service: Gastroenterology;  Laterality: N/A;   CORONARY ANGIOPLASTY WITH STENT PLACEMENT  09/2012   2 stents   CORONARY STENT INTERVENTION N/A 08/19/2016   Procedure: Coronary Stent Intervention;  Surgeon: [name], MD;  Location: [place] CV LAB;  Service: Cardiovascular;  Laterality: N/A;   CORONARY STENT INTERVENTION N/A 04/13/2019   Procedure: CORONARY STENT INTERVENTION;  Surgeon: [name], MD;  Location: [place] CV LAB;  Service: Cardiovascular;  Laterality: N/A;   ESOPHAGEAL MANOMETRY N/A 12/16/2017   Procedure: ESOPHAGEAL MANOMETRY (EM);  Surgeon: Lin Landsman, MD;  Location: [facility] ENDOSCOPY;  Service: Gastroenterology;  Laterality: N/A;   ESOPHAGOGASTRODUODENOSCOPY  N/A 12/20/2016   Procedure: ESOPHAGOGASTRODUODENOSCOPY (EGD);  Surgeon: Lin Landsman, MD;  Location: [facility] ENDOSCOPY;  Service: Gastroenterology;  Laterality: N/A;   ESOPHAGOGASTRODUODENOSCOPY N/A 01/13/2018   Procedure: ESOPHAGOGASTRODUODENOSCOPY (EGD);  Surgeon: Lin Landsman, MD;  Location[facility] ENDOSCOPY;  Service: Gastroenterology;  Laterality: N/A;   ESOPHAGOGASTRODUODENOSCOPY (EGD) WITH PROPOFOL N/A 11/03/2017   Procedure: ESOPHAGOGASTRODUODENOSCOPY (EGD) WITH PROPOFOL;  Surgeon: Lin Landsman, MD;  Location: [facility] ENDOSCOPY;  Service: Gastroenterology;  Laterality: N/A;   EYE SURGERY     HERNIA REPAIR     "navel"   LEFT HEART CATH AND CORONARY ANGIOGRAPHY N/A 08/14/2016   Procedure: Left Heart Cath and Coronary Angiography;  Surgeon: [name], MD;  Location: [place] CV LAB;  Service: Cardiovascular;  Laterality: N/A;   PRESSURE SENSOR/CARDIOMEMS N/A 09/23/2019   Procedure: PRESSURE SENSOR/CARDIOMEMS;  Surgeon: Jolaine Artist, MD;  Location: [place] CV LAB;  Service: Cardiovascular;  Laterality: N/A;   RIGHT HEART CATH N/A 09/23/2019   Procedure: RIGHT HEART CATH;  Surgeon: Jolaine Artist, MD;  Location: [place] CV LAB;  Service: Cardiovascular;  Laterality: N/A;   RIGHT/LEFT HEART CATH AND CORONARY ANGIOGRAPHY N/A 09/01/2018   Procedure: RIGHT/LEFT HEART CATH AND CORONARY ANGIOGRAPHY;  Surgeon: [name], MD;  Location: [place] CV LAB;  Service: Cardiovascular;  Laterality: N/A;   RIGHT/LEFT HEART CATH AND CORONARY ANGIOGRAPHY N/A 04/13/2019   Procedure: RIGHT/LEFT HEART CATH AND CORONARY ANGIOGRAPHY;  Surgeon: [name], MD;  Location: [place] CV LAB;  Service: Cardiovascular;  Laterality: N/A;   TOE AMPUTATION Right    "big toe"     reports that he quit smoking about 47 years ago. His smoking use included cigarettes. He started smoking about 67 years ago. He has a 35.00 pack-year smoking history. He has quit using  smokeless tobacco. He reports that he does not drink alcohol and does not use drugs.  Allergies  Allergen Reactions   Clopidogrel Anaphylaxis and Other (See Comments)    altered mental status;  (electrolytes "out of wack" and confusion per family; THEY DO NOT REMEMBER ANY OTHER REACTION)- MSB 10/10/15   Ciprofloxacin Itching   Beta Adrenergic Blockers     Nightmares   Mirtazapine Diarrhea    Bad dreams    Spironolactone Other (See Comments)    gynecomastia    Family History  Problem Relation Age of Onset   Brain cancer Father    Diabetes Sister    Heart attack Sister     Prior to Admission medications   Medication Sig Start Date End Date Taking? Authorizing Provider  acetaminophen (TYLENOL) 500 MG tablet Take 1,000 mg by mouth every 6 (six) hours as needed for moderate pain or headache.     [provider]  Alcohol Swabs (B-D SINGLE USE SWABS REGULAR) PADS Use to check blood sugar up to 2 times daily 01/01/21   Parks Ranger, [name], DO  allopurinol (ZYLOPRIM) 100 MG tablet TAKE ONE TABLET BY MOUTH ONCE DAILY 01/19/21   Bensimhon, Shaune Pascal, MD  aspirin 81 MG chewable tablet Chew 81 mg by mouth daily.    [provider]  Blood Glucose Monitoring Suppl (TRUE METRIX METER) DEVI Use to check blood sugar up to 2 times daily 05/04/20   Parks Ranger, [name], DO  bosutinib (BOSULIF) 100 MG tablet Take 100 mg by mouth at bedtime. Take with food.    [provider]  Cholecalciferol 25 MCG (1000 UT) capsule Take 1,000 Units by mouth daily.    [provider]  gabapentin (NEURONTIN) 600 MG tablet Take 600 mg by mouth 3 (three) times daily.    [provider]  Insulin Glargine ([name] KWIKPEN) 100 UNIT/ML Inject 18 Units into the skin daily.    [provider]  Insulin Pen Needle 31G X 5 MM MISC 31 g by Does not apply route as directed. 04/16/17   [name], NP  isosorbide mononitrate (IMDUR) 60 MG 24 hr tablet Take 0.5  tablets (30 mg total) by mouth daily. 02/14/21   Theora Gianotti, NP  JARDIANCE 10 MG TABS tablet Take 1 tablet (10 mg total) by mouth daily with breakfast. 02/05/21   Parks Ranger, [name], DO  levothyroxine (SYNTHROID) 25 MCG tablet TAKE ONE TABLET BY MOUTH EVERY MORNING BEFORE BREAKFAST 01/15/21   Karamalegos, [name], DO  losartan (COZAAR) 25 MG tablet Take 25 mg by mouth daily.    [provider]  Menthol-Zinc Oxide (GOLD BOND EX) Apply 1 application topically daily as needed (muscle pain).    [provider]  Multiple Vitamin (MULTIVITAMIN WITH MINERALS) TABS tablet Take 1 tablet by mouth daily.    [provider]  nitroGLYCERIN (NITROSTAT) 0.4 MG SL tablet Place 1 tablet (0.4 mg total) under the tongue every 5 (five) minutes x 3 doses as needed for chest pain. 02/14/21   Theora Gianotti, NP  omeprazole (PRILOSEC) 20 MG capsule Take 20 mg by mouth in the morning.     [provider]  Polyethyl Glycol-Propyl Glycol (SYSTANE OP) Place 1 drop into both eyes 3 (three) times daily as needed (dry/irritated eyes.).     [provider]  polyethylene glycol (MIRALAX / GLYCOLAX) 17 g packet Take 17 g by mouth daily as needed for mild constipation.     [provider]  potassium chloride (KLOR-CON) 10 MEQ tablet Take 1 tablet (10 mEq total) by mouth daily. 02/05/21   Karamalegos, [name], DO  ranolazine (RANEXA) 500 MG 12 hr tablet TAKE ONE TABLET BY MOUTH TWICE DAILY 05/25/20   End, [name], MD  rosuvastatin (CRESTOR) 5 MG tablet TAKE ONE TABLET BY MOUTH ONCE DAILY 02/02/21   End, [name], MD  tamsulosin (FLOMAX) 0.4 MG CAPS capsule Take 1 capsule (0.4 mg total) by mouth daily after supper. 11/24/19   Dunn, Areta Haber, PA-C  torsemide (DEMADEX) 20 MG tablet Take 3 tablets (60 mg total) by mouth daily. 05/19/20   Bensimhon, Shaune Pascal, MD  traMADol (ULTRAM) 50 MG tablet Take 1-2 tablets (50-100 mg total) by mouth at bedtime  as needed. 02/19/21   Karamalegos, [name], DO  TRULICITY 1.5 FF/6.3WG SOPN Inject 1.5 mg into the skin once a week. 01/10/20   Karamalegos, [name], DO  warfarin (COUMADIN) 3 MG tablet TAKE 1 TO 1 & 1/2 TABLETS BY MOUTH AS DIRECTED BY THE COUMADIN CLINIC 12/01/20   [name], MD    Physical Exam: Vitals:   02/21/21 1519 02/21/21 1805 02/21/21 1830 02/21/21 1900  BP: 116/67 139/64 (!) 143/78 125/64  Pulse: 81 78  82  Resp: 18 18  17   Temp:      TempSrc:      SpO2: 98% 97%  97%  Weight:      Height:       Physical Exam Vitals reviewed.  Constitutional:      General: [name] not in acute distress.    Appearance: Normal appearance. [name] obese. [name] not ill-appearing.  HENT:     Head: Normocephalic and atraumatic.     Nose: Nose normal.     Mouth/Throat:     Mouth: Mucous membranes are moist.  Eyes:     Extraocular Movements: Extraocular movements intact.     Pupils: Pupils are equal, round, and reactive to light.  Cardiovascular:     Rate and Rhythm: Bradycardia present.     Pulses: Normal pulses.     Heart sounds: Normal heart sounds.  Pulmonary:     Effort: Pulmonary effort is normal.     Breath sounds: Normal breath sounds.  Abdominal:     General: Bowel sounds are normal.     Palpations: Abdomen is soft.  Musculoskeletal:        General: No swelling.     Left lower leg: No edema.  Skin:    Coloration: Skin is not jaundiced.     Findings: No bruising or erythema.  Neurological:     General:  No focal deficit present.     Mental Status: [name] alert.     Labs on Admission: I have personally reviewed following labs and imaging studies  No results for input(s): CKTOTAL, CKMB, TROPONINI in the last 72 hours. Lab Results  Component Value Date   WBC 11.1 (H) 02/21/2021   HGB 12.1 (L) 02/21/2021   HCT 38.2 (L) 02/21/2021   MCV 86.2 02/21/2021   PLT 167 02/21/2021    Recent Labs  Lab 02/21/21 1127 02/21/21 1944  NA 134*  --   K 3.9  --   CL 101   --   CO2 23  --   BUN 39*  --   CREATININE 1.83* 1.50*  CALCIUM 8.8*  --   PROT 8.0  --   BILITOT 0.9  --   ALKPHOS 60  --   ALT 11  --   AST 22  --   GLUCOSE 212*  --      Radiological Exams on Admission: DG Foot 2 Views Right  Result Date: 02/21/2021 CLINICAL DATA:  Toe infection EXAM: RIGHT FOOT - 2 VIEW COMPARISON:  None. FINDINGS: No fracture or malalignment. Vascular calcifications. No soft tissue emphysema. Previous amputation at the level of the first IP joint. No erosive change or osseous destruction IMPRESSION: Partial amputation first digit.  No acute osseous abnormality Electronically Signed   By: Donavan Foil M.D.   On: 02/21/2021 17:36    EKG: Independently reviewed.  A.fib   Assessment/Plan: Principal Problem:   Diabetic foot ulcer ([place]) Active Problems:   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 heart with stable angina pectoris (HCC)   Essential hypertension   Paroxysmal atrial fibrillation (HCC)   Controlled type 2 diabetes mellitus with diabetic nephropathy, with long-term current use of insulin (HCC)   Centrilobular emphysema (HCC)   CKD (chronic kidney disease), stage IV (HCC)   Obstructive sleep apnea   Diabetic foot infection/diabetic foot ulcer: Patient started on vancomycin and Zosyn in the emergency room for broad-spectrum coverage for gram-positive as well as MRSA which we will continue per pharmacy protocol. General surgery consulted.  Dressing changes per wound care consult.  Heart disease: We will continue patient on aspirin 81, losartan 25. No chest pain and otherwise stable.   Paroxysmal atrial fibrillation: Continue patient on aspirin 81, metoprolol held secondary to bradycardia.  Diabetes mellitus type 2, mixed: Glycemic protocol.   Emphysema, COPD is stable sliding-scale insulin.  CKD: Stable, renally dose medication, avoid contrast studies.    OSA: Sleep apnea for home settings.    DVT prophylaxis:  heparin    Code Status:  Full code    Family Communication:  Anacleto, Batterman (Spouse)  [phone] (Mobile)   Disposition Plan:  SNF.3   Consults called:  None   Admission status: Inpatient.     Para Skeans MD [facility] [phone] How to contact the [facility] Attending or Consulting provider [place] or covering provider during after hours [place], for this patient.    Check the care team in [facility] and look for a) attending/consulting [facility] provider listed and b) the [facility] team listed Log into [email or web address] and use [place] universal password to access. If you do not have the password, please contact the hospital operator. Locate the [facility] provider you are looking for under [facility] and page to a number that you can be directly reached. If you still have difficulty reaching the provider, please page the [facility] (Director on Call)  for the Hospitalists listed on amion for assistance. [email or web address] Password [email or web address] 02/21/2021, 8:22 PM

## 2021-02-21 NOTE — Consult Note (Signed)
Pt known to podiatry department.  Sent from office for antibiotic and likely debridement of bone/toe amputation.  Ulcer to right partial hallux amputation site.  Bone is exposed from the ulcerative site.  Patient will need vascular evaluation prior to amputation.  We will evaluate once patient has become fully admitted.  Likely plan for surgery after vascular evaluation.

## 2021-02-21 NOTE — ED Notes (Signed)
This RN unable to obtain IV access at this time

## 2021-02-21 NOTE — ED Triage Notes (Signed)
Pt comes into the ED via POV c/o Great toe infection on the right foot.  [facility] clinic sent patient over for IV antibiotics.  Pt states he is diabetic and is unsure how his CBG has been running.  PT currently in NAD at this time with even and unlabored respirations.

## 2021-02-21 NOTE — ED Provider Notes (Signed)
[facility] Emergency Department Provider Note   ____________________________________________   Event Date/Time   First MD Initiated Contact with Patient 02/21/21 1605     (approximate)  I have reviewed the triage vital signs and the nursing notes.   HISTORY  Chief Complaint Wound Infection (/)    HPI Lucas Caldwell is a 87 y.o. male with past medical history of hypertension, hyperlipidemia, diabetes, CAD, CHF, atrial fibrillation on coumadin, CKD, and COPD who presents to the ED complaining of foot infection.  Patient reports that over the past 5 days he has noticed increased pain starting in his right great toe and moving up into his right foot.  His wife has begun to notice redness streaking from his great toe into the top of his foot.  Patient states he has felt hot and sweaty with occasional chills, but he has not taken his temperature at home.  He went to see his podiatrist, Dr. Luana Shu, earlier today and was told that he needed to go to the hospital for IV antibiotics.        Past Medical History:  Diagnosis Date   Arthritis    "probably in his legs" (08/15/2016)   Blind right eye    BPH (benign prostatic hyperplasia)    Breast asymmetry    Left breast is larger, present for several years.   CAD (coronary artery disease)    a. Cath in the late 90's - reportedly ok;  b. 2014 s/p stenting x 2 @ [facility]; c 08/19/16 Cath/PCI with DES -> RCA, plan to treat LM 70% medically. Seen by surgery and felt to be too high risk for CABG; d. 08/2018 Cath: LM 70 (iFR 0.86), LAD 20ost, 40p, 50/38m, D1 70, LCX patent stent, OM1 30, RCA patent stent, 14m ISR, 40d, RPDA 30. PCWP 8. CO/CI 4.0/2.1; e. 03/2019 PCI to LAD (2.5x15 Resolute Onyx DES).   Chronic combined systolic (congestive) and diastolic (congestive) heart failure ([place])    a. Previously reduced EF-->50% by echo in 2012;  b. 06/2015 Echo: EF 50-55%  c. 07/2016 Echo: EF 45-50%; d. 12/2017 Echo: EF 55%; e. 08/2018 Echo:  EF 35-40%; f. 04/2019 Echo: EF 30-35%; g. s/p cardiomems; h. 10/2020 Echo: EF 30-35%, mod-sev glob HK, gr1 DD.   Chronic kidney disease (CKD), stage IV (severe) (HCC)    CML (chronic myelocytic leukemia) (HCC)    GERD (gastroesophageal reflux disease)    GIB (gastrointestinal bleeding)    a. 12/2017 3 unit PRBC GIB in setting of coumadin-->Endo/colon multiple duodenal ulcers and a single bleeding ulcer in the proximal ascending colon status post hemostatic clipping x2.   Gout    Hyperlipemia    Hypertension    Hypothyroidism    Iron deficiency anemia    Ischemic cardiomyopathy    a. Previously reduced EF-->50% by echo in 2012;  b. 06/2015 Echo: EF 50-55%, Gr2 DD;  c. 07/2016 Echo: EF 45-50%; d. 12/2017 Echo: EF 55%, Gr1 DD, mild MR; e. 08/2018 Echo: EF 35-40%; f. 04/2019 Echo: EF 30-35%; g. 10/2020 Echo: EF 30-35%.   Migraine    "in the 1960s" (08/15/2016)   Obstructive sleep apnea    Orthostatic hypotension    a. 20222 -->carvedilol and hydralazine d/c'd.   PAF (paroxysmal atrial fibrillation) (HCC)    a. ? Dx 2014-->s/p DCCV;  b. CHA2DS2VASc = 6--> Coumadin.   Prostate cancer ([place])    RBBB    Type II diabetes mellitus [facility])     Patient Active Problem  List   Diagnosis Date Noted   Diabetic polyneuropathy associated with type 2 diabetes mellitus ([place]) 11/23/2020   Orthostatic hypotension 10/16/2020   Obstructive sleep apnea 06/29/2020   Urge incontinence of urine 12/22/2019   Trifascicular block 10/20/2019   Wound infection 10/08/2019   Bifascicular block 05/13/2019   Coronary stent patent 04/13/2019   Chronic HFrEF (heart failure with reduced ejection fraction) ([place]) 03/19/2019   PSVT (paroxysmal supraventricular tachycardia) ([place]) 03/19/2019   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 ([place]) 08/24/2018   CKD (chronic kidney disease), stage IV ([place]) 06/04/2018   Osteoarthritis of knees, bilateral 06/02/2018   Hyperlipidemia associated with type 2 diabetes mellitus ([place])  05/06/2018   Pain of lower extremity 03/06/2018   Duodenitis 01/30/2018   Chronic heart failure with preserved ejection fraction (HFpEF) ([place]) 01/23/2018   Hematochezia    Melena    Rectal bleeding 01/11/2018   AVM (arteriovenous malformation) of colon without hemorrhage    Schatzki's ring    Esophageal dysphagia    Diabetic retinopathy ([place]) 07/08/2017   Ischemic cardiomyopathy 06/26/2017   Cardiomyopathy ([place]) 06/26/2017   Centrilobular emphysema ([place]) 06/13/2017   Fatigue 12/18/2016   Stable angina ([place]) 09/20/2016   Stage 3a chronic kidney disease ([place]) 09/20/2016   Paroxysmal atrial fibrillation ([place]) 08/28/2016   Chronic anticoagulation 08/28/2016   Essential hypertension 08/20/2016   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 heart with stable angina pectoris ([place]) 08/19/2016   Accelerating angina ([place]) 08/15/2016   Exertional dyspnea 08/12/2016   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[place]) 12/29/2015   Gynecomastia, male 03/31/2014   Hypertrophy of breast 03/31/2014   Nuclear sclerosis of left eye 10/22/2013   Enthesopathy of ankle and tarsus 04/10/2013   Controlled type 2 diabetes mellitus with diabetic nephropathy, with long-term current use of insulin ([place]) 04/09/2013   Diabetic neuropathy associated with type 2 diabetes mellitus ([place]) 04/09/2013   Cataract of left eye 11/25/2011   Corneal opacity 09/30/2011   Pseudophakia of right eye 09/30/2011   GERD (gastroesophageal reflux disease) 09/25/2011   Gout 09/25/2011   Hyperlipidemia LDL goal <70 09/25/2011   Status post cataract extraction 04/11/2011   Cataract extraction status of eye 04/11/2011   Iron deficiency anemia 04/01/2011   Benign localized hyperplasia of prostate without urinary obstruction and other lower urinary tract symptoms (LUTS) 12/10/2010   Enlarged prostate without lower urinary tract symptoms (luts) 12/10/2010   Colon cancer screening 10/11/2010   Hypothyroidism 10/11/2010   Hyperlipidemia 09/20/2003    Essential and other specified forms of tremor 01/05/2001   Essential tremor 01/05/2001    Past Surgical History:  Procedure Laterality Date   CATARACT EXTRACTION W/ INTRAOCULAR LENS  IMPLANT, BILATERAL Bilateral    COLONOSCOPY N/A 01/13/2018   Procedure: COLONOSCOPY;  Surgeon: Lin Landsman, MD;  Location: [facility] ENDOSCOPY;  Service: Gastroenterology;  Laterality: N/A;   COLONOSCOPY WITH PROPOFOL N/A 01/01/2018   Procedure: COLONOSCOPY WITH PROPOFOL;  Surgeon: Lin Landsman, MD;  Location: [facility] ENDOSCOPY;  Service: Gastroenterology;  Laterality: N/A;   CORONARY ANGIOPLASTY WITH STENT PLACEMENT  09/2012   2 stents   CORONARY STENT INTERVENTION N/A 08/19/2016   Procedure: Coronary Stent Intervention;  Surgeon: [name], MD;  Location: [place] CV LAB;  Service: Cardiovascular;  Laterality: N/A;   CORONARY STENT INTERVENTION N/A 04/13/2019   Procedure: CORONARY STENT INTERVENTION;  Surgeon: [name], MD;  Location: [place] CV LAB;  Service: Cardiovascular;  Laterality: N/A;   ESOPHAGEAL MANOMETRY N/A 12/16/2017   Procedure:  ESOPHAGEAL MANOMETRY (EM);  Surgeon: Lin Landsman, MD;  Location: [facility] ENDOSCOPY;  Service: Gastroenterology;  Laterality: N/A;   ESOPHAGOGASTRODUODENOSCOPY N/A 12/20/2016   Procedure: ESOPHAGOGASTRODUODENOSCOPY (EGD);  Surgeon: Lin Landsman, MD;  Location: [facility] ENDOSCOPY;  Service: Gastroenterology;  Laterality: N/A;   ESOPHAGOGASTRODUODENOSCOPY N/A 01/13/2018   Procedure: ESOPHAGOGASTRODUODENOSCOPY (EGD);  Surgeon: Lin Landsman, MD;  Location: [facility] ENDOSCOPY;  Service: Gastroenterology;  Laterality: N/A;   ESOPHAGOGASTRODUODENOSCOPY (EGD) WITH PROPOFOL N/A 11/03/2017   Procedure: ESOPHAGOGASTRODUODENOSCOPY (EGD) WITH PROPOFOL;  Surgeon: Lin Landsman, MD;  Location: [facility] ENDOSCOPY;  Service: Gastroenterology;  Laterality: N/A;   EYE SURGERY     HERNIA REPAIR     "[place]"   LEFT HEART CATH AND CORONARY ANGIOGRAPHY  N/A 08/14/2016   Procedure: Left Heart Cath and Coronary Angiography;  Surgeon: [name], MD;  Location: [place] CV LAB;  Service: Cardiovascular;  Laterality: N/A;   PRESSURE SENSOR/CARDIOMEMS N/A 09/23/2019   Procedure: PRESSURE SENSOR/CARDIOMEMS;  Surgeon: Jolaine Artist, MD;  Location: [place] CV LAB;  Service: Cardiovascular;  Laterality: N/A;   RIGHT HEART CATH N/A 09/23/2019   Procedure: RIGHT HEART CATH;  Surgeon: Jolaine Artist, MD;  Location: [place] CV LAB;  Service: Cardiovascular;  Laterality: N/A;   RIGHT/LEFT HEART CATH AND CORONARY ANGIOGRAPHY N/A 09/01/2018   Procedure: RIGHT/LEFT HEART CATH AND CORONARY ANGIOGRAPHY;  Surgeon: [name], MD;  Location: [place] CV LAB;  Service: Cardiovascular;  Laterality: N/A;   RIGHT/LEFT HEART CATH AND CORONARY ANGIOGRAPHY N/A 04/13/2019   Procedure: RIGHT/LEFT HEART CATH AND CORONARY ANGIOGRAPHY;  Surgeon: [name], MD;  Location: [place] CV LAB;  Service: Cardiovascular;  Laterality: N/A;   TOE AMPUTATION Right    "big toe"    Prior to Admission medications   Medication Sig Start Date End Date Taking? Authorizing Provider  acetaminophen (TYLENOL) 500 MG tablet Take 1,000 mg by mouth every 6 (six) hours as needed for moderate pain or headache.     [provider]  Alcohol Swabs (B-D SINGLE USE SWABS REGULAR) PADS Use to check blood sugar up to 2 times daily 01/01/21   Parks Ranger, [name], DO  allopurinol (ZYLOPRIM) 100 MG tablet TAKE ONE TABLET BY MOUTH ONCE DAILY 01/19/21   Bensimhon, Shaune Pascal, MD  aspirin 81 MG chewable tablet Chew 81 mg by mouth daily.    [provider]  Blood Glucose Monitoring Suppl (TRUE METRIX METER) DEVI Use to check blood sugar up to 2 times daily 05/04/20   Parks Ranger, [name], DO  bosutinib (BOSULIF) 100 MG tablet Take 100 mg by mouth at bedtime. Take with food.    [provider]  Cholecalciferol 25 MCG (1000 UT) capsule Take  1,000 Units by mouth daily.    [provider]  gabapentin (NEURONTIN) 600 MG tablet Take 600 mg by mouth 3 (three) times daily.    [provider]  Insulin Glargine ([name] KWIKPEN) 100 UNIT/ML Inject 18 Units into the skin daily.    [provider]  Insulin Pen Needle 31G X 5 MM MISC 31 g by Does not apply route as directed. 04/16/17   [name], NP  isosorbide mononitrate (IMDUR) 60 MG 24 hr tablet Take 0.5 tablets (30 mg total) by mouth daily. 02/14/21   Theora Gianotti, NP  JARDIANCE 10 MG TABS tablet Take 1 tablet (10 mg total) by mouth daily with breakfast. 02/05/21   Parks Ranger, [name], DO  levothyroxine (SYNTHROID) 25 MCG tablet TAKE ONE TABLET BY  MOUTH EVERY MORNING BEFORE BREAKFAST 01/15/21   Karamalegos, [name], DO  losartan (COZAAR) 25 MG tablet Take 25 mg by mouth daily.    [provider]  Menthol-Zinc Oxide (GOLD BOND EX) Apply 1 application topically daily as needed (muscle pain).    [provider]  Multiple Vitamin (MULTIVITAMIN WITH MINERALS) TABS tablet Take 1 tablet by mouth daily.    [provider]  nitroGLYCERIN (NITROSTAT) 0.4 MG SL tablet Place 1 tablet (0.4 mg total) under the tongue every 5 (five) minutes x 3 doses as needed for chest pain. 02/14/21   Theora Gianotti, NP  omeprazole (PRILOSEC) 20 MG capsule Take 20 mg by mouth in the morning.     [provider]  Polyethyl Glycol-Propyl Glycol (SYSTANE OP) Place 1 drop into both eyes 3 (three) times daily as needed (dry/irritated eyes.).     [provider]  polyethylene glycol (MIRALAX / GLYCOLAX) 17 g packet Take 17 g by mouth daily as needed for mild constipation.     [provider]  potassium chloride (KLOR-CON) 10 MEQ tablet Take 1 tablet (10 mEq total) by mouth daily. 02/05/21   Karamalegos, [name], DO  ranolazine (RANEXA) 500 MG 12 hr tablet TAKE ONE TABLET BY MOUTH TWICE DAILY 05/25/20    End, [name], MD  rosuvastatin (CRESTOR) 5 MG tablet TAKE ONE TABLET BY MOUTH ONCE DAILY 02/02/21   End, [name], MD  tamsulosin (FLOMAX) 0.4 MG CAPS capsule Take 1 capsule (0.4 mg total) by mouth daily after supper. 11/24/19   Dunn, Areta Haber, PA-C  torsemide (DEMADEX) 20 MG tablet Take 3 tablets (60 mg total) by mouth daily. 05/19/20   Bensimhon, Shaune Pascal, MD  traMADol (ULTRAM) 50 MG tablet Take 1-2 tablets (50-100 mg total) by mouth at bedtime as needed. 02/19/21   Karamalegos, [name], DO  TRULICITY 1.5 WC/3.7SE SOPN Inject 1.5 mg into the skin once a week. 01/10/20   Karamalegos, [name], DO  warfarin (COUMADIN) 3 MG tablet TAKE 1 TO 1 & 1/2 TABLETS BY MOUTH AS DIRECTED BY THE COUMADIN CLINIC 12/01/20   End, [name], MD    Allergies Clopidogrel, Ciprofloxacin, Beta adrenergic blockers, Mirtazapine, and Spironolactone  Family History  Problem Relation Age of Onset   Brain cancer Father    Diabetes Sister    Heart attack Sister     Social History Social History   Tobacco Use   Smoking status: Former    Packs/day: 1.75    Years: 20.00    Pack years: 35.00    Types: Cigarettes    Start date: 1955    Quit date: 1975    Years since quitting: 47.9   Smokeless tobacco: Former  Scientific laboratory technician Use: Never used  Substance Use Topics   Alcohol use: No    Comment: previously drank heavily - quit 1979.   Drug use: No    Review of Systems  Constitutional: Positive for subjective fever/chills Eyes: No visual changes. ENT: No sore throat. Cardiovascular: Denies chest pain. Respiratory: Denies shortness of breath. Gastrointestinal: No abdominal pain.  No nausea, no vomiting.  No diarrhea.  No constipation. Genitourinary: Negative for dysuria. Musculoskeletal: Positive for right great toe and foot pain. Skin: Positive for rash. Neurological: Negative for headaches, focal weakness or numbness.  ____________________________________________   PHYSICAL  EXAM:  VITAL SIGNS: ED Triage Vitals [02/21/21 1124]  Enc Vitals Group     BP 121/73     Pulse Rate 80  Resp 18     Temp 98.6 F (37 C)     Temp Source Oral     SpO2 97 %     Weight 197 lb (89.4 kg)     Height 5\' 3"  (1.6 m)     Head Circumference      Peak Flow      Pain Score 0     Pain Loc      Pain Edu?      Excl. in [place]?     Constitutional: Alert and oriented. Eyes: Conjunctivae are normal. Head: Atraumatic. Nose: No congestion/rhinnorhea. Mouth/Throat: Mucous membranes are moist. Neck: Normal ROM Cardiovascular: Normal rate, regular rhythm. Grossly normal heart sounds.  2+ DP pulses bilaterally. Respiratory: Normal respiratory effort.  No retractions. Lungs CTAB. Gastrointestinal: Soft and nontender. No distention. Genitourinary: deferred Musculoskeletal: Ulceration noted to plantar surface of right great toe with erythema, edema, and warmth affecting the toe circumferentially with erythema tracking into the dorsum of his right foot.  Diffuse tenderness to palpation noted over right great toe and dorsum of right foot.  Purulent drainage with foul odor noted from ulceration. Neurologic:  Normal speech and language. No gross focal neurologic deficits are appreciated. Skin:  Skin is warm, dry and intact. No rash noted. Psychiatric: Mood and affect are normal. Speech and behavior are normal.  ____________________________________________   LABS (all labs ordered are listed, but only abnormal results are displayed)  Labs Reviewed  COMPREHENSIVE METABOLIC PANEL - Abnormal; Notable for the following components:      Result Value   Sodium 134 (*)    Glucose, Bld 212 (*)    BUN 39 (*)    Creatinine, Ser 1.83 (*)    Calcium 8.8 (*)    Albumin 3.4 (*)    GFR, Estimated 36 (*)    All other components within normal limits  CBC WITH DIFFERENTIAL/PLATELET - Abnormal; Notable for the following components:   Hemoglobin 12.8 (*)    RDW 15.6 (*)    Neutro Abs 8.1 (*)     All other components within normal limits  CULTURE, BLOOD (ROUTINE X 2)  CULTURE, BLOOD (ROUTINE X 2)  RESP PANEL BY RT-PCR (FLU A&B, COVID) ARPGX2  LACTIC ACID, PLASMA  LACTIC ACID, PLASMA  URINALYSIS, ROUTINE W REFLEX MICROSCOPIC  PROTIME-INR    PROCEDURES  Procedure(s) performed (including Critical Care):  Procedures   ____________________________________________   INITIAL IMPRESSION / ASSESSMENT AND PLAN / ED COURSE      87 year old male with possible history of hypertension, hyperlipidemia, CAD, atrial fibrillation on Coumadin, diabetes, CHF, CKD, and COPD who presents to the ED complaining of increasing pain, swelling, and redness affecting his right great toe and dorsum of right foot.  He has ulceration to the base of his right great toe with associated erythema, edema, warmth, and tenderness.  Presentation is concerning for diabetic foot infection with cellulitis tracking into the dorsum of his right foot.  He remains neurovascularly intact to the right foot, x-ray reviewed by me with no fracture, dislocation, or obvious signs of osteomyelitis.  We will draw blood cultures and start on broad-spectrum antibiotics, labs are unremarkable at this time.  Plan to discuss with hospitalist for admission.      ____________________________________________   FINAL CLINICAL IMPRESSION(S) / ED DIAGNOSES  Final diagnoses:  Diabetic foot infection ([place])  Cellulitis of foot     ED Discharge Orders     None        Note:  This document was  prepared using Systems analyst and may include unintentional dictation errors.    Blake Divine, MD 02/21/21 1755

## 2021-02-21 NOTE — Consult Note (Signed)
ANTICOAGULATION CONSULT NOTE - Initial Consult  Pharmacy Consult for Warfarin Indication: atrial fibrillation  Allergies  Allergen Reactions   Clopidogrel Anaphylaxis and Other (See Comments)    altered mental status;  (electrolytes "out of wack" and confusion per family; THEY DO NOT REMEMBER ANY OTHER REACTION)- MSB 10/10/15   Ciprofloxacin Itching   Beta Adrenergic Blockers     Nightmares   Mirtazapine Diarrhea    Bad dreams    Spironolactone Other (See Comments)    gynecomastia    Patient Measurements: Height: 5\' 3"  (160 cm) Weight: 89.4 kg (197 lb) IBW/kg (Calculated) : 56.9 Heparin Dosing Weight: 76.6 kg  Vital Signs: Temp: 98.6 F (37 C) (12/07 1124) Temp Source: Oral (12/07 1124) BP: 125/64 (12/07 1900) Pulse Rate: 82 (12/07 1900)  Labs: Recent Labs    02/21/21 1127  HGB 12.8*  HCT 39.3  PLT 164  CREATININE 1.83*    Estimated Creatinine Clearance: 29.2 mL/min (A) (by C-G formula based on SCr of 1.83 mg/dL (H)).   Medical History: Past Medical History:  Diagnosis Date   Arthritis    "probably in his legs" (08/15/2016)   Blind right eye    BPH (benign prostatic hyperplasia)    Breast asymmetry    Left breast is larger, present for several years.   CAD (coronary artery disease)    a. Cath in the late 90's - reportedly ok;  b. 2014 s/p stenting x 2 @ [facility]; c 08/19/16 Cath/PCI with DES -> RCA, plan to treat LM 70% medically. Seen by surgery and felt to be too high risk for CABG; d. 08/2018 Cath: LM 70 (iFR 0.86), LAD 20ost, 40p, 50/16m, D1 70, LCX patent stent, OM1 30, RCA patent stent, 59m ISR, 40d, RPDA 30. PCWP 8. CO/CI 4.0/2.1; e. 03/2019 PCI to LAD (2.5x15 Resolute Onyx DES).   Chronic combined systolic (congestive) and diastolic (congestive) heart failure ([place])    a. Previously reduced EF-->50% by echo in 2012;  b. 06/2015 Echo: EF 50-55%  c. 07/2016 Echo: EF 45-50%; d. 12/2017 Echo: EF 55%; e. 08/2018 Echo: EF 35-40%; f. 04/2019 Echo: EF 30-35%; g. s/p  cardiomems; h. 10/2020 Echo: EF 30-35%, mod-sev glob HK, gr1 DD.   Chronic kidney disease (CKD), stage IV (severe) (HCC)    CML (chronic myelocytic leukemia) (HCC)    GERD (gastroesophageal reflux disease)    GIB (gastrointestinal bleeding)    a. 12/2017 3 unit PRBC GIB in setting of coumadin-->Endo/colon multiple duodenal ulcers and a single bleeding ulcer in the proximal ascending colon status post hemostatic clipping x2.   Gout    Hyperlipemia    Hypertension    Hypothyroidism    Iron deficiency anemia    Ischemic cardiomyopathy    a. Previously reduced EF-->50% by echo in 2012;  b. 06/2015 Echo: EF 50-55%, Gr2 DD;  c. 07/2016 Echo: EF 45-50%; d. 12/2017 Echo: EF 55%, Gr1 DD, mild MR; e. 08/2018 Echo: EF 35-40%; f. 04/2019 Echo: EF 30-35%; g. 10/2020 Echo: EF 30-35%.   Migraine    "in the 1960s" (08/15/2016)   Obstructive sleep apnea    Orthostatic hypotension    a. 20222 -->carvedilol and hydralazine d/c'd.   PAF (paroxysmal atrial fibrillation) (HCC)    a. ? Dx 2014-->s/p DCCV;  b. CHA2DS2VASc = 6--> Coumadin.   Prostate cancer ([place])    RBBB    Type II diabetes mellitus ([place])     Medications:  (Not in a hospital admission)  Scheduled:   allopurinol  100 mg  Oral Daily   aspirin  81 mg Oral Daily   [name] KwikPen  18 Units Subcutaneous Daily   [START ON 02/22/2021] bosutinib  100 mg Oral Q breakfast   heparin  5,000 Units Subcutaneous Q8H   [START ON 02/22/2021] insulin aspart  0-15 Units Subcutaneous TID WC   isosorbide mononitrate  30 mg Oral Daily   [START ON 02/22/2021] levothyroxine  25 mcg Oral Q breakfast   losartan  25 mg Oral Daily   ranolazine  500 mg Oral BID   rosuvastatin  5 mg Oral Daily   tamsulosin  0.4 mg Oral QPC supper   traMADol  50-100 mg Oral Q12H   Infusions:   sodium chloride     [START ON 02/22/2021] piperacillin-tazobactam     vancomycin     PRN: acetaminophen, hydrALAZINE, nitroGLYCERIN Anti-infectives (From admission, onward)    Start      Dose/Rate Route Frequency Ordered Stop   02/22/21 0600  piperacillin-tazobactam (ZOSYN) IVPB 3.375 g        3.375 g 12.5 mL/hr over 240 Minutes Intravenous Every 8 hours 02/21/21 1932     02/21/21 1945  vancomycin (VANCOCIN) IVPB 1000 mg/200 mL premix        1,000 mg 200 mL/hr over 60 Minutes Intravenous  Once 02/21/21 1943     02/21/21 1715  vancomycin (VANCOCIN) IVPB 1000 mg/200 mL premix        1,000 mg 200 mL/hr over 60 Minutes Intravenous  Once 02/21/21 1710 02/21/21 1939   02/21/21 1715  piperacillin-tazobactam (ZOSYN) IVPB 3.375 g        3.375 g 100 mL/hr over 30 Minutes Intravenous  Once 02/21/21 1710 02/21/21 1832       Assessment: Pharmacy has been consulted to monitor and dose warfarin in 87yo patient with history of atrial fibrillation. Patient's home dose of Warfarin: alternates between 4.5mg  and 3mg  daily. Last dose taken was 12/6 in the evening.  12/7 INR: 1.6  Goal of Therapy:  INR 2-3 Monitor platelets by anticoagulation protocol: Yes   Plan:  --Will order Warfarin 4.5mg  x 1 dose for this evening. --Will recheck INR with AM labs, CBC daily  Marcquis Ridlon A Vasily Fedewa 02/21/2021,7:47 PM

## 2021-02-21 NOTE — Consult Note (Signed)
Pharmacy Antibiotic Note  Lucas Caldwell is a 87 y.o. male admitted on 02/21/2021 with cellulitis.  Pharmacy has been consulted for Vancomycin dosing. Patient is also receiving Zosyn.  Plan: Vancomycin 1250 mg IV Q 48 hrs. Goal AUC 400-550. Expected AUC: 491.4 SCr used: 1.5   Height: 5\' 3"  (160 cm) Weight: 89.4 kg (197 lb) IBW/kg (Calculated) : 56.9  Temp (24hrs), Avg:98.6 F (37 C), Min:98.6 F (37 C), Max:98.6 F (37 C)  Recent Labs  Lab 02/21/21 1127 02/21/21 1800 02/21/21 1944  WBC 10.5  --  11.1*  CREATININE 1.83*  --  1.50*  LATICACIDVEN 1.6 1.2  --     Estimated Creatinine Clearance: 35.6 mL/min (A) (by C-G formula based on SCr of 1.5 mg/dL (H)).    Allergies  Allergen Reactions   Clopidogrel Anaphylaxis and Other (See Comments)    altered mental status;  (electrolytes "out of wack" and confusion per family; THEY DO NOT REMEMBER ANY OTHER REACTION)- MSB 10/10/15   Ciprofloxacin Itching   Beta Adrenergic Blockers     Nightmares   Mirtazapine Diarrhea    Bad dreams    Spironolactone Other (See Comments)    gynecomastia    Antimicrobials this admission: Vancomycin 12/7 >>  Zosyn 12/7 >>   Microbiology results: 12/7 BCx: pending  Thank you for allowing pharmacy to be a part of this patient's care.  Lucas Caldwell 02/21/2021 8:38 PM

## 2021-02-22 ENCOUNTER — Inpatient Hospital Stay: Payer: Medicare HMO

## 2021-02-22 ENCOUNTER — Other Ambulatory Visit (HOSPITAL_COMMUNITY): Payer: Self-pay

## 2021-02-22 DIAGNOSIS — I10 Essential (primary) hypertension: Secondary | ICD-10-CM

## 2021-02-22 DIAGNOSIS — G4733 Obstructive sleep apnea (adult) (pediatric): Secondary | ICD-10-CM

## 2021-02-22 DIAGNOSIS — I480 Paroxysmal atrial fibrillation: Secondary | ICD-10-CM

## 2021-02-22 DIAGNOSIS — I70235 Atherosclerosis of native arteries of right leg with ulceration of other part of foot: Secondary | ICD-10-CM

## 2021-02-22 DIAGNOSIS — L03119 Cellulitis of unspecified part of limb: Secondary | ICD-10-CM | POA: Insufficient documentation

## 2021-02-22 DIAGNOSIS — N185 Chronic kidney disease, stage 5: Secondary | ICD-10-CM

## 2021-02-22 DIAGNOSIS — E11628 Type 2 diabetes mellitus with other skin complications: Secondary | ICD-10-CM | POA: Insufficient documentation

## 2021-02-22 DIAGNOSIS — L97514 Non-pressure chronic ulcer of other part of right foot with necrosis of bone: Secondary | ICD-10-CM

## 2021-02-22 DIAGNOSIS — L089 Local infection of the skin and subcutaneous tissue, unspecified: Secondary | ICD-10-CM

## 2021-02-22 LAB — BLOOD CULTURE ID PANEL (REFLEXED) - BCID2
A.calcoaceticus-baumannii: NOT DETECTED
A.calcoaceticus-baumannii: NOT DETECTED
Bacteroides fragilis: NOT DETECTED
Bacteroides fragilis: NOT DETECTED
Candida albicans: NOT DETECTED
Candida albicans: NOT DETECTED
Candida auris: NOT DETECTED
Candida auris: NOT DETECTED
Candida glabrata: NOT DETECTED
Candida glabrata: NOT DETECTED
Candida krusei: NOT DETECTED
Candida krusei: NOT DETECTED
Candida parapsilosis: NOT DETECTED
Candida parapsilosis: NOT DETECTED
Candida tropicalis: NOT DETECTED
Candida tropicalis: NOT DETECTED
Cryptococcus neoformans/gattii: NOT DETECTED
Cryptococcus neoformans/gattii: NOT DETECTED
Enterobacter cloacae complex: NOT DETECTED
Enterobacter cloacae complex: NOT DETECTED
Enterobacterales: NOT DETECTED
Enterobacterales: NOT DETECTED
Enterococcus Faecium: NOT DETECTED
Enterococcus Faecium: NOT DETECTED
Enterococcus faecalis: NOT DETECTED
Enterococcus faecalis: NOT DETECTED
Escherichia coli: NOT DETECTED
Escherichia coli: NOT DETECTED
Haemophilus influenzae: NOT DETECTED
Haemophilus influenzae: NOT DETECTED
Klebsiella aerogenes: NOT DETECTED
Klebsiella aerogenes: NOT DETECTED
Klebsiella oxytoca: NOT DETECTED
Klebsiella oxytoca: NOT DETECTED
Klebsiella pneumoniae: NOT DETECTED
Klebsiella pneumoniae: NOT DETECTED
Listeria monocytogenes: NOT DETECTED
Listeria monocytogenes: NOT DETECTED
Neisseria meningitidis: NOT DETECTED
Neisseria meningitidis: NOT DETECTED
Proteus species: NOT DETECTED
Proteus species: NOT DETECTED
Pseudomonas aeruginosa: NOT DETECTED
Pseudomonas aeruginosa: NOT DETECTED
Salmonella species: NOT DETECTED
Salmonella species: NOT DETECTED
Serratia marcescens: NOT DETECTED
Serratia marcescens: NOT DETECTED
Staphylococcus aureus (BCID): NOT DETECTED
Staphylococcus aureus (BCID): NOT DETECTED
Staphylococcus epidermidis: NOT DETECTED
Staphylococcus epidermidis: NOT DETECTED
Staphylococcus lugdunensis: NOT DETECTED
Staphylococcus lugdunensis: NOT DETECTED
Staphylococcus species: DETECTED — AB
Staphylococcus species: NOT DETECTED
Stenotrophomonas maltophilia: NOT DETECTED
Stenotrophomonas maltophilia: NOT DETECTED
Streptococcus agalactiae: NOT DETECTED
Streptococcus agalactiae: NOT DETECTED
Streptococcus pneumoniae: NOT DETECTED
Streptococcus pneumoniae: NOT DETECTED
Streptococcus pyogenes: NOT DETECTED
Streptococcus pyogenes: NOT DETECTED
Streptococcus species: NOT DETECTED
Streptococcus species: NOT DETECTED

## 2021-02-22 LAB — GLUCOSE, CAPILLARY
Glucose-Capillary: 115 mg/dL — ABNORMAL HIGH (ref 70–99)
Glucose-Capillary: 132 mg/dL — ABNORMAL HIGH (ref 70–99)

## 2021-02-22 LAB — COMPREHENSIVE METABOLIC PANEL
ALT: 10 U/L (ref 0–44)
AST: 18 U/L (ref 15–41)
Albumin: 3.2 g/dL — ABNORMAL LOW (ref 3.5–5.0)
Alkaline Phosphatase: 57 U/L (ref 38–126)
Anion gap: 8 (ref 5–15)
BUN: 28 mg/dL — ABNORMAL HIGH (ref 8–23)
CO2: 24 mmol/L (ref 22–32)
Calcium: 8.5 mg/dL — ABNORMAL LOW (ref 8.9–10.3)
Chloride: 101 mmol/L (ref 98–111)
Creatinine, Ser: 1.32 mg/dL — ABNORMAL HIGH (ref 0.61–1.24)
GFR, Estimated: 53 mL/min — ABNORMAL LOW (ref >60)
Glucose, Bld: 83 mg/dL (ref 70–99)
Potassium: 3.8 mmol/L (ref 3.5–5.1)
Sodium: 133 mmol/L — ABNORMAL LOW (ref 135–145)
Total Bilirubin: 1.1 mg/dL (ref 0.3–1.2)
Total Protein: 7.9 g/dL (ref 6.5–8.1)

## 2021-02-22 LAB — CBC
HCT: 38.7 % — ABNORMAL LOW (ref 39.0–52.0)
Hemoglobin: 12.3 g/dL — ABNORMAL LOW (ref 13.0–17.0)
MCH: 27.5 pg (ref 26.0–34.0)
MCHC: 31.8 g/dL (ref 30.0–36.0)
MCV: 86.6 fL (ref 80.0–100.0)
Platelets: 181 10*3/uL (ref 150–400)
RBC: 4.47 MIL/uL (ref 4.22–5.81)
RDW: 15.3 % (ref 11.5–15.5)
WBC: 11.6 10*3/uL — ABNORMAL HIGH (ref 4.0–10.5)
nRBC: 0 % (ref 0.0–0.2)

## 2021-02-22 LAB — CBG MONITORING, ED: Glucose-Capillary: 94 mg/dL (ref 70–99)

## 2021-02-22 LAB — PROTIME-INR
INR: 1.7 — ABNORMAL HIGH (ref 0.8–1.2)
Prothrombin Time: 19.6 seconds — ABNORMAL HIGH (ref 11.4–15.2)

## 2021-02-22 MED ORDER — POVIDONE-IODINE 10 % EX SWAB
2.0000 "application " | Freq: Once | CUTANEOUS | Status: AC
  Administered 2021-02-23: 2 via TOPICAL

## 2021-02-22 MED ORDER — EMPAGLIFLOZIN 10 MG PO TABS
10.0000 mg | ORAL_TABLET | Freq: Every day | ORAL | Status: DC
  Administered 2021-02-24: 10 mg via ORAL
  Filled 2021-02-22 (×2): qty 1

## 2021-02-22 MED ORDER — INSULIN GLARGINE-YFGN 100 UNIT/ML ~~LOC~~ SOLN
18.0000 [IU] | Freq: Every day | SUBCUTANEOUS | Status: DC
  Administered 2021-02-23: 18 [IU] via SUBCUTANEOUS
  Filled 2021-02-22: qty 0.18

## 2021-02-22 MED ORDER — WARFARIN SODIUM 5 MG PO TABS
5.0000 mg | ORAL_TABLET | Freq: Once | ORAL | Status: DC
Start: 2021-02-22 — End: 2021-02-22

## 2021-02-22 MED ORDER — CHLORHEXIDINE GLUCONATE 4 % EX LIQD
60.0000 mL | Freq: Once | CUTANEOUS | Status: DC
  Administered 2021-02-23: 4 via TOPICAL

## 2021-02-22 MED ORDER — VANCOMYCIN HCL 1500 MG/300ML IV SOLN
1500.0000 mg | INTRAVENOUS | Status: DC
  Administered 2021-02-24: 1500 mg via INTRAVENOUS
  Filled 2021-02-22: qty 300

## 2021-02-22 NOTE — Progress Notes (Signed)
PROGRESS NOTE  Lucas Caldwell    DOB: 1933/04/30, 87 y.o.  DVV:[id number]  PCP: [name], DO   Code Status: Full Code   DOA: 02/21/2021   LOS: 1  Brief Narrative of Current Hospitalization  Lucas Caldwell is a 87 y.o. male with a PMH significant for OA, right eye blindness, BPH, HTN, HLD, hypothyroidism, IDA, OSA, migraines, PAF, orthostatic hypotension, history of GI bleed from duodenal ulcer, RBBB, type II DM, GERD, HFrEF EF 30 to 35%. They presented from home to the ED on 02/21/2021 with redness and pain to right great toe x 5 days.  He was seen by his podiatrist and they recommended presentation to ED.  In the ED, it was found that they had R great toe foot ulcer with ascending erythema. They were treated with IV antibiotics and general surgery was consulted.  Patient was admitted to medicine service for further workup and management of diabetic foot ulcer as outlined in detail below.  02/22/21 -stable  Assessment & Plan  Principal Problem:   Diabetic foot ulcer ([place]) Active Problems:   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 heart with stable angina pectoris ([place])   Essential hypertension   Paroxysmal atrial fibrillation (HCC)   Controlled type 2 diabetes mellitus with diabetic nephropathy, with long-term current use of insulin (HCC)   Centrilobular emphysema (HCC)   CKD (chronic kidney disease), stage IV (HCC)   Obstructive sleep apnea  Diabetic foot ulcer right great toe-patient has minimal pain. ABI completed - MRI R foot - vascular consulted - continue IV Abx -Discontinue warfarin -PT/INR daily -Analgesia as needed  CAD  HFrEF  HTN  Afib-chronic, stable, asymptomatic.  Rate controlled. - continue home medications - consulted pharmacy to discuss if patient can be changed from warfarin to [place] following surgery  Type II DM-hemoglobin A1c was 6.2 recently which is well below goal of 8 for his age.  Home meds include 18 units long-acting  daily, Jard[place].  -Continue home medications and discontinue sliding scale  CKDIII- Cr appears to be at or better than baseline this admission -BMP a.m.   DVT prophylaxis: heparin injection 5,000 Units Start: 02/21/21 2200 SCDs Start: 02/21/21 1933   Diet:  Diet Orders (From admission, onward)     Start     Ordered   02/21/21 1933  Diet heart healthy/carb modified Room service appropriate? Yes; Fluid consistency: Thin  Diet effective now       Question Answer Comment  Diet-HS Snack? Nothing   Room service appropriate? Yes   Fluid consistency: Thin      02/21/21 1932            Subjective 02/22/21    Pt reports minimal pain. Endorses spreading erythema from R great toe for several days  Disposition Plan & Communication  Patient status: Inpatient  Admitted From: Home Disposition: Home Anticipated discharge date: TBD  Family Communication: wife at bedside  Consults, Procedures, Significant Events  Consultants:  Vascular surgery  Procedures/significant events:  none Antimicrobials:  Anti-infectives (From admission, onward)    Start     Dose/Rate Route Frequency Ordered Stop   02/23/21 2000  vancomycin (VANCOREADY) IVPB 1250 mg/250 mL        1,250 mg 166.7 mL/hr over 90 Minutes Intravenous Every 48 hours 02/21/21 2040     02/22/21 0600  piperacillin-tazobactam (ZOSYN) IVPB 3.375 g        3.375 g 12.5 mL/hr over 240 Minutes Intravenous Every 8 hours 02/21/21 1932  02/21/21 1945  vancomycin (VANCOCIN) IVPB 1000 mg/200 mL premix        1,000 mg 200 mL/hr over 60 Minutes Intravenous  Once 02/21/21 1943 02/21/21 2153   02/21/21 1715  vancomycin (VANCOCIN) IVPB 1000 mg/200 mL premix        1,000 mg 200 mL/hr over 60 Minutes Intravenous  Once 02/21/21 1710 02/21/21 1939   02/21/21 1715  piperacillin-tazobactam (ZOSYN) IVPB 3.375 g        3.375 g 100 mL/hr over 30 Minutes Intravenous  Once 02/21/21 1710 02/21/21 1832       Objective   Vitals:   02/22/21  0500 02/22/21 0530 02/22/21 0600 02/22/21 0700  BP: (!) 144/90 115/80 137/75 (!) 153/74  Pulse: 92 82 77 73  Resp: 18     Temp:      TempSrc:      SpO2: 98% 97% 99% 96%  Weight:      Height:       No intake or output data in the 24 hours ending 02/22/21 0723 Filed Weights   02/21/21 1124  Weight: 89.4 kg    Patient BMI: Body mass index is 34.9 kg/m.   Physical Exam: General: awake, alert, NAD Respiratory: normal respiratory effort. Cardiovascular: quick capillary refill Ues. Decreased pedal pulses, Les warm. RRR.  Nervous: A&O x3. no gross focal neurologic deficits, normal speech Extremities: negative edema Skin: dry, intact, normal temperature, normal color. Positive for ulcer of R great toe on plantar surface. Non draining or bleeding.  Psychiatry: normal mood, congruent affect  Labs   I have personally reviewed following labs and imaging studies Admission on 02/21/2021  Component Date Value Ref Range Status   Lactic Acid, Venous 02/21/2021 1.6  0.5 - 1.9 mmol/L Final   Lactic Acid, Venous 02/21/2021 1.2  0.5 - 1.9 mmol/L Final   Sodium 02/21/2021 134 (L)  135 - 145 mmol/L Final   Potassium 02/21/2021 3.9  3.5 - 5.1 mmol/L Final   Chloride 02/21/2021 101  98 - 111 mmol/L Final   CO2 02/21/2021 23  22 - 32 mmol/L Final   Glucose, Bld 02/21/2021 212 (H)  70 - 99 mg/dL Final   BUN 02/21/2021 39 (H)  8 - 23 mg/dL Final   Creatinine, Ser 02/21/2021 1.83 (H)  0.61 - 1.24 mg/dL Final   Calcium 02/21/2021 8.8 (L)  8.9 - 10.3 mg/dL Final   Total Protein 02/21/2021 8.0  6.5 - 8.1 g/dL Final   Albumin 02/21/2021 3.4 (L)  3.5 - 5.0 g/dL Final   AST 02/21/2021 22  15 - 41 U/L Final   ALT 02/21/2021 11  0 - 44 U/L Final   Alkaline Phosphatase 02/21/2021 60  38 - 126 U/L Final   Total Bilirubin 02/21/2021 0.9  0.3 - 1.2 mg/dL Final   GFR, Estimated 02/21/2021 36 (L)  >60 mL/min Final   Anion gap 02/21/2021 10  5 - 15 Final   WBC 02/21/2021 10.5  4.0 - 10.5 K/uL Final   RBC  02/21/2021 4.60  4.22 - 5.81 MIL/uL Final   Hemoglobin 02/21/2021 12.8 (L)  13.0 - 17.0 g/dL Final   HCT 02/21/2021 39.3  39.0 - 52.0 % Final   MCV 02/21/2021 85.4  80.0 - 100.0 fL Final   MCH 02/21/2021 27.8  26.0 - 34.0 pg Final   MCHC 02/21/2021 32.6  30.0 - 36.0 g/dL Final   RDW 02/21/2021 15.6 (H)  11.5 - 15.5 % Final   Platelets 02/21/2021 164  150 -  400 K/uL Final   nRBC 02/21/2021 0.0  0.0 - 0.2 % Final   Neutrophils Relative % 02/21/2021 78  % Final   Neutro Abs 02/21/2021 8.1 (H)  1.7 - 7.7 K/uL Final   Lymphocytes Relative 02/21/2021 12  % Final   Lymphs Abs 02/21/2021 1.2  0.7 - 4.0 K/uL Final   Monocytes Relative 02/21/2021 9  % Final   Monocytes Absolute 02/21/2021 1.0  0.1 - 1.0 K/uL Final   Eosinophils Relative 02/21/2021 1  % Final   Eosinophils Absolute 02/21/2021 0.1  0.0 - 0.5 K/uL Final   Basophils Relative 02/21/2021 0  % Final   Basophils Absolute 02/21/2021 0.0  0.0 - 0.1 K/uL Final   Immature Granulocytes 02/21/2021 0  % Final   Abs Immature Granulocytes 02/21/2021 0.03  0.00 - 0.07 K/uL Final   Color, Urine 02/21/2021 YELLOW  YELLOW Final   APPearance 02/21/2021 CLEAR (A)  CLEAR Final   Specific Gravity, Urine 02/21/2021 1.015  1.005 - 1.030 Final   pH 02/21/2021 5.5  5.0 - 8.0 Final   Glucose, UA 02/21/2021 >1,000 (A)  NEGATIVE mg/dL Final   Hgb urine dipstick 02/21/2021 NEGATIVE  NEGATIVE Final   Bilirubin Urine 02/21/2021 NEGATIVE  NEGATIVE Final   Ketones, ur 02/21/2021 NEGATIVE  NEGATIVE mg/dL Final   Protein, ur 02/21/2021 NEGATIVE  NEGATIVE mg/dL Final   Nitrite 02/21/2021 NEGATIVE  NEGATIVE Final   Leukocytes,Ua 02/21/2021 NEGATIVE  NEGATIVE Final   WBC, UA 02/21/2021 NONE SEEN  0 - 5 WBC/hpf Final   Bacteria, UA 02/21/2021 NONE SEEN  NONE SEEN Final   Squamous Epithelial / LPF 02/21/2021 0-5  0 - 5 Final   Mucus 02/21/2021 PRESENT   Final   Hyaline Casts, UA 02/21/2021 PRESENT   Final   SARS Coronavirus 2 by RT PCR 02/21/2021 NEGATIVE  NEGATIVE  Final   Influenza A by PCR 02/21/2021 NEGATIVE  NEGATIVE Final   Influenza B by PCR 02/21/2021 NEGATIVE  NEGATIVE Final   WBC 02/21/2021 11.1 (H)  4.0 - 10.5 K/uL Final   RBC 02/21/2021 4.43  4.22 - 5.81 MIL/uL Final   Hemoglobin 02/21/2021 12.1 (L)  13.0 - 17.0 g/dL Final   HCT 02/21/2021 38.2 (L)  39.0 - 52.0 % Final   MCV 02/21/2021 86.2  80.0 - 100.0 fL Final   MCH 02/21/2021 27.3  26.0 - 34.0 pg Final   MCHC 02/21/2021 31.7  30.0 - 36.0 g/dL Final   RDW 02/21/2021 15.4  11.5 - 15.5 % Final   Platelets 02/21/2021 167  150 - 400 K/uL Final   nRBC 02/21/2021 0.0  0.0 - 0.2 % Final   Creatinine, Ser 02/21/2021 1.50 (H)  0.61 - 1.24 mg/dL Final   GFR, Estimated 02/21/2021 45 (L)  >60 mL/min Final   Sodium 02/22/2021 133 (L)  135 - 145 mmol/L Final   Potassium 02/22/2021 3.8  3.5 - 5.1 mmol/L Final   Chloride 02/22/2021 101  98 - 111 mmol/L Final   CO2 02/22/2021 24  22 - 32 mmol/L Final   Glucose, Bld 02/22/2021 83  70 - 99 mg/dL Final   BUN 02/22/2021 28 (H)  8 - 23 mg/dL Final   Creatinine, Ser 02/22/2021 1.32 (H)  0.61 - 1.24 mg/dL Final   Calcium 02/22/2021 8.5 (L)  8.9 - 10.3 mg/dL Final   Total Protein 02/22/2021 7.9  6.5 - 8.1 g/dL Final   Albumin 02/22/2021 3.2 (L)  3.5 - 5.0 g/dL Final   AST 02/22/2021 18  15 - 41 U/L Final   ALT 02/22/2021 10  0 - 44 U/L Final   Alkaline Phosphatase 02/22/2021 57  38 - 126 U/L Final   Total Bilirubin 02/22/2021 1.1  0.3 - 1.2 mg/dL Final   GFR, Estimated 02/22/2021 53 (L)  >60 mL/min Final   Anion gap 02/22/2021 8  5 - 15 Final   WBC 02/22/2021 11.6 (H)  4.0 - 10.5 K/uL Final   RBC 02/22/2021 4.47  4.22 - 5.81 MIL/uL Final   Hemoglobin 02/22/2021 12.3 (L)  13.0 - 17.0 g/dL Final   HCT 02/22/2021 38.7 (L)  39.0 - 52.0 % Final   MCV 02/22/2021 86.6  80.0 - 100.0 fL Final   MCH 02/22/2021 27.5  26.0 - 34.0 pg Final   MCHC 02/22/2021 31.8  30.0 - 36.0 g/dL Final   RDW 02/22/2021 15.3  11.5 - 15.5 % Final   Platelets 02/22/2021 181  150 -  400 K/uL Final   nRBC 02/22/2021 0.0  0.0 - 0.2 % Final   Prothrombin Time 02/21/2021 19.3 (H)  11.4 - 15.2 seconds Final   INR 02/21/2021 1.6 (H)  0.8 - 1.2 Final   Prothrombin Time 02/22/2021 19.6 (H)  11.4 - 15.2 seconds Final   INR 02/22/2021 1.7 (H)  0.8 - 1.2 Final   Glucose-Capillary 02/21/2021 132 (H)  70 - 99 mg/dL Final    Imaging Studies  DG Foot 2 Views Right  Result Date: 02/21/2021 CLINICAL DATA:  Toe infection EXAM: RIGHT FOOT - 2 VIEW COMPARISON:  None. FINDINGS: No fracture or malalignment. Vascular calcifications. No soft tissue emphysema. Previous amputation at the level of the first IP joint. No erosive change or osseous destruction IMPRESSION: Partial amputation first digit.  No acute osseous abnormality Electronically Signed   By: Donavan Foil M.D.   On: 02/21/2021 17:36   Medications   Scheduled Meds:  allopurinol  100 mg Oral Daily   aspirin  81 mg Oral Daily   bosutinib  100 mg Oral Q breakfast   heparin  5,000 Units Subcutaneous Q8H   insulin aspart  0-15 Units Subcutaneous TID WC   insulin glargine-yfgn  18 Units Subcutaneous QHS   isosorbide mononitrate  30 mg Oral Daily   levothyroxine  25 mcg Oral Q breakfast   losartan  25 mg Oral Daily   ranolazine  500 mg Oral BID   rosuvastatin  5 mg Oral Daily   tamsulosin  0.4 mg Oral QPC supper   traMADol  50-100 mg Oral Q12H   Warfarin - Pharmacist Dosing Inpatient   Does not apply [id number]   No recently discontinued medications to reconcile  LOS: 1 day   Time spent: >36min  Richarda Osmond, DO [facility] 02/22/2021, 7:23 AM   Please refer to amion to contact the [facility] Attending or Consulting provider for this pt  [email or web address] Available by Epic secure chat 7AM-7PM. If 7PM-7AM, please contact night-coverage

## 2021-02-22 NOTE — Consult Note (Addendum)
ORTHOPAEDIC CONSULTATION  REQUESTING PHYSICIAN: Para Skeans, MD  Chief Complaint: Ulcer right foot  HPI: Lucas Caldwell is a 87 y.o. male who complains of worsening redness and ulceration to his right foot.  He has been seen in the outpatient clinic by Dr. Luana Shu for some time now.  Noted redness swelling and drainage to this in the outpatient clinic yesterday.  Past Medical History:  Diagnosis Date   Arthritis    "probably in his legs" (08/15/2016)   Blind right eye    BPH (benign prostatic hyperplasia)    Breast asymmetry    Left breast is larger, present for several years.   CAD (coronary artery disease)    a. Cath in the late 90's - reportedly ok;  b. 2014 s/p stenting x 2 @ [facility]; c 08/19/16 Cath/PCI with DES -> RCA, plan to treat LM 70% medically. Seen by surgery and felt to be too high risk for CABG; d. 08/2018 Cath: LM 70 (iFR 0.86), LAD 20ost, 40p, 50/44m, D1 70, LCX patent stent, OM1 30, RCA patent stent, 78m ISR, 40d, RPDA 30. PCWP 8. CO/CI 4.0/2.1; e. 03/2019 PCI to LAD (2.5x15 Resolute Onyx DES).   Chronic combined systolic (congestive) and diastolic (congestive) heart failure ([place])    a. Previously reduced EF-->50% by echo in 2012;  b. 06/2015 Echo: EF 50-55%  c. 07/2016 Echo: EF 45-50%; d. 12/2017 Echo: EF 55%; e. 08/2018 Echo: EF 35-40%; f. 04/2019 Echo: EF 30-35%; g. s/p cardiomems; h. 10/2020 Echo: EF 30-35%, mod-sev glob HK, gr1 DD.   Chronic kidney disease (CKD), stage IV (severe) (HCC)    CML (chronic myelocytic leukemia) (HCC)    GERD (gastroesophageal reflux disease)    GIB (gastrointestinal bleeding)    a. 12/2017 3 unit PRBC GIB in setting of coumadin-->Endo/colon multiple duodenal ulcers and a single bleeding ulcer in the proximal ascending colon status post hemostatic clipping x2.   Gout    Hyperlipemia    Hypertension    Hypothyroidism    Iron deficiency anemia    Ischemic cardiomyopathy    a. Previously reduced EF-->50% by echo in 2012;  b. 06/2015 Echo: EF  50-55%, Gr2 DD;  c. 07/2016 Echo: EF 45-50%; d. 12/2017 Echo: EF 55%, Gr1 DD, mild MR; e. 08/2018 Echo: EF 35-40%; f. 04/2019 Echo: EF 30-35%; g. 10/2020 Echo: EF 30-35%.   Migraine    "in the 1960s" (08/15/2016)   Obstructive sleep apnea    Orthostatic hypotension    a. 20222 -->carvedilol and hydralazine d/c'd.   PAF (paroxysmal atrial fibrillation) (HCC)    a. ? Dx 2014-->s/p DCCV;  b. CHA2DS2VASc = 6--> Coumadin.   Prostate cancer ([place])    RBBB    Type II diabetes mellitus ([place])    Past Surgical History:  Procedure Laterality Date   CATARACT EXTRACTION W/ INTRAOCULAR LENS  IMPLANT, BILATERAL Bilateral    COLONOSCOPY N/A 01/13/2018   Procedure: COLONOSCOPY;  Surgeon: Lin Landsman, MD;  Location: [facility] ENDOSCOPY;  Service: Gastroenterology;  Laterality: N/A;   COLONOSCOPY WITH PROPOFOL N/A 01/01/2018   Procedure: COLONOSCOPY WITH PROPOFOL;  Surgeon: Lin Landsman, MD;  Location: [facility] ENDOSCOPY;  Service: Gastroenterology;  Laterality: N/A;   CORONARY ANGIOPLASTY WITH STENT PLACEMENT  09/2012   2 stents   CORONARY STENT INTERVENTION N/A 08/19/2016   Procedure: Coronary Stent Intervention;  Surgeon: [name], MD;  Location: [place] CV LAB;  Service: Cardiovascular;  Laterality: N/A;   CORONARY STENT INTERVENTION N/A 04/13/2019   Procedure: CORONARY  STENT INTERVENTION;  Surgeon: [name], MD;  Location: [place] CV LAB;  Service: Cardiovascular;  Laterality: N/A;   ESOPHAGEAL MANOMETRY N/A 12/16/2017   Procedure: ESOPHAGEAL MANOMETRY (EM);  Surgeon: Lin Landsman, MD;  Location: [facility] ENDOSCOPY;  Service: Gastroenterology;  Laterality: N/A;   ESOPHAGOGASTRODUODENOSCOPY N/A 12/20/2016   Procedure: ESOPHAGOGASTRODUODENOSCOPY (EGD);  Surgeon: Lin Landsman, MD;  Location: [facility] ENDOSCOPY;  Service: Gastroenterology;  Laterality: N/A;   ESOPHAGOGASTRODUODENOSCOPY N/A 01/13/2018   Procedure: ESOPHAGOGASTRODUODENOSCOPY (EGD);  Surgeon: Lin Landsman,  MD;  Location: [facility] ENDOSCOPY;  Service: Gastroenterology;  Laterality: N/A;   ESOPHAGOGASTRODUODENOSCOPY (EGD) WITH PROPOFOL N/A 11/03/2017   Procedure: ESOPHAGOGASTRODUODENOSCOPY (EGD) WITH PROPOFOL;  Surgeon: Lin Landsman, MD;  Location: [facility] ENDOSCOPY;  Service: Gastroenterology;  Laterality: N/A;   EYE SURGERY     HERNIA REPAIR     "navel"   LEFT HEART CATH AND CORONARY ANGIOGRAPHY N/A 08/14/2016   Procedure: Left Heart Cath and Coronary Angiography;  Surgeon: [name], MD;  Location: [place] CV LAB;  Service: Cardiovascular;  Laterality: N/A;   PRESSURE SENSOR/CARDIOMEMS N/A 09/23/2019   Procedure: PRESSURE SENSOR/CARDIOMEMS;  Surgeon: Jolaine Artist, MD;  Location: [place] CV LAB;  Service: Cardiovascular;  Laterality: N/A;   RIGHT HEART CATH N/A 09/23/2019   Procedure: RIGHT HEART CATH;  Surgeon: Jolaine Artist, MD;  Location: [place] CV LAB;  Service: Cardiovascular;  Laterality: N/A;   RIGHT/LEFT HEART CATH AND CORONARY ANGIOGRAPHY N/A 09/01/2018   Procedure: RIGHT/LEFT HEART CATH AND CORONARY ANGIOGRAPHY;  Surgeon: [name], MD;  Location: [place] CV LAB;  Service: Cardiovascular;  Laterality: N/A;   RIGHT/LEFT HEART CATH AND CORONARY ANGIOGRAPHY N/A 04/13/2019   Procedure: RIGHT/LEFT HEART CATH AND CORONARY ANGIOGRAPHY;  Surgeon: [name], MD;  Location: [place] CV LAB;  Service: Cardiovascular;  Laterality: N/A;   TOE AMPUTATION Right    "big toe"   Social History   Socioeconomic History   Marital status: Married    Spouse name: Not on file   Number of children: Not on file   Years of education: Not on file   Highest education level: 7th grade  Occupational History   Occupation: Retired    Comment: Owned his own [employer].  Tobacco Use   Smoking status: Former    Packs/day: 1.75    Years: 20.00    Pack years: 35.00    Types: Cigarettes    Start date: 35    Quit date: 1975    Years since quitting:  47.9   Smokeless tobacco: Former  Scientific laboratory technician Use: Never used  Substance and Sexual Activity   Alcohol use: No    Comment: previously drank heavily - quit 1979.   Drug use: No   Sexual activity: Never  Other Topics Concern   Not on file  Social History Narrative   Working on transportation truck part time    Social Determinants of Radio broadcast assistant Strain: Low Risk    Difficulty of Paying Living Expenses: Not hard at all  Food Insecurity: No Food Insecurity   Worried About Charity fundraiser in the Last Year: Never true   Arboriculturist in the Last Year: Never true  Transportation Needs: No Transportation Needs   Lack of Transportation (Medical): No   Lack of Transportation (Non-Medical): No  Physical Activity: Inactive   Days of Exercise per Week: 0 days   Minutes of Exercise per Session: 0 min  Stress:  No Stress Concern Present   Feeling of Stress : Not at all  Social Connections: Moderately Integrated   Frequency of Communication with Friends and Family: More than three times a week   Frequency of Social Gatherings with Friends and Family: More than three times a week   Attends Religious Services: More than 4 times per year   Active Member of [employer] or Organizations: No   Attends Music therapist: Never   Marital Status: Married   Family History  Problem Relation Age of Onset   Brain cancer Father    Diabetes Sister    Heart attack Sister    Allergies  Allergen Reactions   Clopidogrel Anaphylaxis and Other (See Comments)    altered mental status;  (electrolytes "out of wack" and confusion per family; THEY DO NOT REMEMBER ANY OTHER REACTION)- MSB 10/10/15   Ciprofloxacin Itching   Beta Adrenergic Blockers     Nightmares   Mirtazapine Diarrhea    Bad dreams    Spironolactone Other (See Comments)    gynecomastia   Prior to Admission medications   Medication Sig Start Date End Date Taking? Authorizing Provider  allopurinol  (ZYLOPRIM) 100 MG tablet TAKE ONE TABLET BY MOUTH ONCE DAILY Patient taking differently: Take 100 mg by mouth daily. 01/19/21  Yes Bensimhon, Shaune Pascal, MD  aspirin 81 MG chewable tablet Chew 81 mg by mouth daily.   Yes [provider]  bosutinib (BOSULIF) 100 MG tablet Take 100 mg by mouth at bedtime. Take with food.   Yes [provider]  Cholecalciferol 25 MCG (1000 UT) capsule Take 1,000 Units by mouth daily.   Yes [provider]  gabapentin (NEURONTIN) 600 MG tablet Take 600 mg by mouth 3 (three) times daily.   Yes [provider]  Insulin Glargine ([name] KWIKPEN) 100 UNIT/ML Inject 18 Units into the skin daily.   Yes [provider]  isosorbide mononitrate (IMDUR) 60 MG 24 hr tablet Take 0.5 tablets (30 mg total) by mouth daily. 02/14/21  Yes Theora Gianotti, NP  JARDIANCE 10 MG TABS tablet Take 1 tablet (10 mg total) by mouth daily with breakfast. 02/05/21  Yes Karamalegos, [name], DO  levothyroxine (SYNTHROID) 25 MCG tablet TAKE ONE TABLET BY MOUTH EVERY MORNING BEFORE BREAKFAST Patient taking differently: Take 25 mcg by mouth daily before breakfast. 01/15/21  Yes Karamalegos, [name], DO  losartan (COZAAR) 25 MG tablet Take 12.5 mg by mouth daily.   Yes [provider]  Multiple Vitamin (MULTIVITAMIN WITH MINERALS) TABS tablet Take 1 tablet by mouth daily.   Yes [provider]  omeprazole (PRILOSEC) 20 MG capsule Take 20 mg by mouth in the morning.    Yes [provider]  potassium chloride (KLOR-CON) 10 MEQ tablet Take 1 tablet (10 mEq total) by mouth daily. 02/05/21  Yes Karamalegos, [name], DO  ranolazine (RANEXA) 500 MG 12 hr tablet TAKE ONE TABLET BY MOUTH TWICE DAILY Patient taking differently: Take 500 mg by mouth 2 (two) times daily. 05/25/20  Yes End, [name], MD  rosuvastatin (CRESTOR) 5 MG tablet TAKE ONE TABLET BY MOUTH ONCE DAILY Patient taking differently: Take 5 mg by mouth  daily. 02/02/21  Yes End, [name], MD  tamsulosin (FLOMAX) 0.4 MG CAPS capsule Take 1 capsule (0.4 mg total) by mouth daily after supper. 11/24/19  Yes Dunn, Areta Haber, PA-C  torsemide (DEMADEX) 20 MG tablet Take 3 tablets (60 mg total) by mouth daily. 05/19/20  Yes Bensimhon, Shaune Pascal,  MD  traMADol (ULTRAM) 50 MG tablet Take 1-2 tablets (50-100 mg total) by mouth at bedtime as needed. 02/19/21  Yes Karamalegos, [name], DO  warfarin (COUMADIN) 3 MG tablet TAKE 1 TO 1 & 1/2 TABLETS BY MOUTH AS DIRECTED BY THE COUMADIN CLINIC 12/01/20  Yes End, [name], MD  acetaminophen (TYLENOL) 500 MG tablet Take 1,000 mg by mouth every 6 (six) hours as needed for moderate pain or headache.     [provider]  Alcohol Swabs (B-D SINGLE USE SWABS REGULAR) PADS Use to check blood sugar up to 2 times daily 01/01/21   Parks Ranger, [name], DO  Blood Glucose Monitoring Suppl (TRUE METRIX METER) DEVI Use to check blood sugar up to 2 times daily 05/04/20   Parks Ranger, [name], DO  gabapentin (NEURONTIN) 300 MG capsule Take 600 mg by mouth 2 (two) times daily. 02/17/21   [provider]  Insulin Pen Needle 31G X 5 MM MISC 31 g by Does not apply route as directed. 04/16/17   [name], NP  Menthol-Zinc Oxide (GOLD BOND EX) Apply 1 application topically daily as needed (muscle pain).    [provider]  nitroGLYCERIN (NITROSTAT) 0.4 MG SL tablet Place 1 tablet (0.4 mg total) under the tongue every 5 (five) minutes x 3 doses as needed for chest pain. 02/14/21   Theora Gianotti, NP  Polyethyl Glycol-Propyl Glycol (SYSTANE OP) Place 1 drop into both eyes 3 (three) times daily as needed (dry/irritated eyes.).     [provider]  polyethylene glycol (MIRALAX / GLYCOLAX) 17 g packet Take 17 g by mouth daily as needed for mild constipation.     [provider]  TRULICITY 1.5 IP/3.8SN SOPN Inject 1.5 mg into the skin once a week. 01/10/20   [name], DO   US ARTERIAL ABI (SCREENING LOWER EXTREMITY)  Result Date: 02/22/2021 CLINICAL DATA:  87 year old male with a history of cardiac disease EXAM: NONINVASIVE PHYSIOLOGIC VASCULAR STUDY OF BILATERAL LOWER EXTREMITIES TECHNIQUE: Evaluation of both lower extremities was performed at rest, including calculation of ankle-brachial indices, multiple segmental pressure evaluation, segmental Doppler and segmental pulse volume recording. COMPARISON:  None. FINDINGS: Right ABI:  0.48 Left ABI:  Noncompressible Right Lower Extremity: Segmental Doppler at the right ankle demonstrates monophasic waveform of the dorsalis pedis and biphasic blunted posterior tibial waveform Left Lower Extremity: Segmental Doppler at the left ankle demonstrates monophasic waveforms IMPRESSION: Right: Resting ABI in the moderate range arterial occlusive disease. Segmental Doppler at the right ankle demonstrates evidence of more proximal occlusive disease. Left: Resting ABI noncompressible Segmental Doppler at the left ankle demonstrates evidence of more proximal occlusive disease Signed, Dulcy Fanny. Dellia Nims, RPVI [facility] Radiology Electronically Signed   By: Corrie Mckusick D.O.   On: 02/22/2021 08:56   DG Foot 2 Views Right  Result Date: 02/21/2021 CLINICAL DATA:  Toe infection EXAM: RIGHT FOOT - 2 VIEW COMPARISON:  None. FINDINGS: No fracture or malalignment. Vascular calcifications. No soft tissue emphysema. Previous amputation at the level of the first IP joint. No erosive change or osseous destruction IMPRESSION: Partial amputation first digit.  No acute osseous abnormality Electronically Signed   By: Donavan Foil M.D.   On: 02/21/2021 17:36    Positive ROS: All other systems have been reviewed and were otherwise negative with the exception of those mentioned in the HPI and as above.  12 point ROS was performed.  Physical Exam: General: Alert and oriented.  No  apparent distress.  Vascular:  Left foot:Dorsalis Pedis:  present Posterior Tibial:  diminished  Right foot: Dorsalis Pedis:  absent Posterior Tibial:  absent  Doppler examination revealed by and triphasic pulses on his left foot.   He had a monophasic DP pulse on his right foot and a monophasic to barely biphasic pulse at his posterior tibial on his right side.  Neuro:absent protective sensation  Derm: Left foot without ulceration.  Skin is totally intact  He has a small ulcer on the plantar aspect of the distal right great toe at the previous amputation site.  This probes directly down to bone.  There is noted erythema dorsally.  Ortho/MS: Status post partial amputation right great toe.  Assessment: Diabetic foot ulcer with infection right great toe Likely osteomyelitis right great toe  Plan: The patient will need debridement and removal of the residual distal aspect of the great toe.  This wound probes directly down to bone with purulence and surrounding erythema.  X-rays were negative for obvious erosive changes but with the ulcer probing to bone and this has been a very chronic problem the recommendation would be removal of the proximal phalanx.  His pulses were not palpable but he had dopplerable pulses.  ABI on the right side if .48.  I spoke to vascular surgery and unfortunately they will not be able to perform angio prior to the weekend.  Due to the infection at this time I have recommended amputation of the great toe to remove the infectious process.  If there is poor perfusion of the skin flaps will need to perform angio early next week.  I discussed this with vascular surgery.  I discussed the risk benefits alternatives and complications associated with surgery and the patient has given consent for this.    Elesa Hacker, DPM Cell [phone]   02/22/2021 12:21 PM

## 2021-02-22 NOTE — Progress Notes (Signed)
PHARMACY - PHYSICIAN COMMUNICATION CRITICAL VALUE ALERT - BLOOD CULTURE IDENTIFICATION (BCID)  Lucas Caldwell is an 87 y.o. male who presented to [facility] on 02/21/2021 with a chief complaint of foot infection  Assessment:  diabetic foot infection on broad spectrum antibiotics.  12/7 blood culture with GPC in 1/4 bottles, BCID detected staphylococcus species (NOT S aureus or epidermidis).    Name of physician (or Provider) Contacted: Dr Ouida Sills  Current antibiotics: vancomycin and pip/tazo  Changes to prescribed antibiotics recommended:  Patient is on recommended antibiotics - No changes needed - possible contaminant but current antibiotics provide activity  Results for orders placed or performed during the hospital encounter of 02/21/21  Blood Culture ID Panel (Reflexed) (Collected: 02/21/2021  6:00 PM)  Result Value Ref Range   Enterococcus faecalis NOT DETECTED NOT DETECTED   Enterococcus Faecium NOT DETECTED NOT DETECTED   Listeria monocytogenes NOT DETECTED NOT DETECTED   Staphylococcus species DETECTED (A) NOT DETECTED   Staphylococcus aureus (BCID) NOT DETECTED NOT DETECTED   Staphylococcus epidermidis NOT DETECTED NOT DETECTED   Staphylococcus lugdunensis NOT DETECTED NOT DETECTED   Streptococcus species NOT DETECTED NOT DETECTED   Streptococcus agalactiae NOT DETECTED NOT DETECTED   Streptococcus pneumoniae NOT DETECTED NOT DETECTED   Streptococcus pyogenes NOT DETECTED NOT DETECTED   A.calcoaceticus-baumannii NOT DETECTED NOT DETECTED   Bacteroides fragilis NOT DETECTED NOT DETECTED   Enterobacterales NOT DETECTED NOT DETECTED   Enterobacter cloacae complex NOT DETECTED NOT DETECTED   Escherichia coli NOT DETECTED NOT DETECTED   Klebsiella aerogenes NOT DETECTED NOT DETECTED   Klebsiella oxytoca NOT DETECTED NOT DETECTED   Klebsiella pneumoniae NOT DETECTED NOT DETECTED   Proteus species NOT DETECTED NOT DETECTED   Salmonella species NOT DETECTED NOT DETECTED    Serratia marcescens NOT DETECTED NOT DETECTED   Haemophilus influenzae NOT DETECTED NOT DETECTED   Neisseria meningitidis NOT DETECTED NOT DETECTED   Pseudomonas aeruginosa NOT DETECTED NOT DETECTED   Stenotrophomonas maltophilia NOT DETECTED NOT DETECTED   Candida albicans NOT DETECTED NOT DETECTED   Candida auris NOT DETECTED NOT DETECTED   Candida glabrata NOT DETECTED NOT DETECTED   Candida krusei NOT DETECTED NOT DETECTED   Candida parapsilosis NOT DETECTED NOT DETECTED   Candida tropicalis NOT DETECTED NOT DETECTED   Cryptococcus neoformans/gattii NOT DETECTED NOT DETECTED    Doreene Eland, PharmD, BCPS, BCIDP Work Cell: [phone] 02/22/2021 12:23 PM

## 2021-02-22 NOTE — ED Notes (Signed)
RN informed bed assigned 

## 2021-02-22 NOTE — Progress Notes (Signed)
PHARMACY - PHYSICIAN COMMUNICATION CRITICAL VALUE ALERT - BLOOD CULTURE IDENTIFICATION (BCID)  Lucas Caldwell is an 87 y.o. male who presented to [facility] on 02/21/2021 with a chief complaint of foot infection  Assessment:  diabetic foot infection on broad spectrum antibiotics.  12/7 blood culture with GPC in 1/4 bottles, BCID detected staphylococcus species (NOT S aureus or epidermidis).  12/7 update: NEW gram-variable cocci in 1/4 bottles not detected on BCID    Name of physician (or Provider) Contacted:   Current antibiotics: vancomycin and pip/tazo  Changes to prescribed antibiotics recommended:  Patient is on recommended antibiotics - No changes needed - current antibiotics provide activity  Results for orders placed or performed during the hospital encounter of 02/21/21  Blood Culture ID Panel (Reflexed) (Collected: 02/21/2021  6:00 PM)  Result Value Ref Range   Enterococcus faecalis NOT DETECTED NOT DETECTED   Enterococcus Faecium NOT DETECTED NOT DETECTED   Listeria monocytogenes NOT DETECTED NOT DETECTED   Staphylococcus species NOT DETECTED NOT DETECTED   Staphylococcus aureus (BCID) NOT DETECTED NOT DETECTED   Staphylococcus epidermidis NOT DETECTED NOT DETECTED   Staphylococcus lugdunensis NOT DETECTED NOT DETECTED   Streptococcus species NOT DETECTED NOT DETECTED   Streptococcus agalactiae NOT DETECTED NOT DETECTED   Streptococcus pneumoniae NOT DETECTED NOT DETECTED   Streptococcus pyogenes NOT DETECTED NOT DETECTED   A.calcoaceticus-baumannii NOT DETECTED NOT DETECTED   Bacteroides fragilis NOT DETECTED NOT DETECTED   Enterobacterales NOT DETECTED NOT DETECTED   Enterobacter cloacae complex NOT DETECTED NOT DETECTED   Escherichia coli NOT DETECTED NOT DETECTED   Klebsiella aerogenes NOT DETECTED NOT DETECTED   Klebsiella oxytoca NOT DETECTED NOT DETECTED   Klebsiella pneumoniae NOT DETECTED NOT DETECTED   Proteus species NOT DETECTED NOT DETECTED   Salmonella  species NOT DETECTED NOT DETECTED   Serratia marcescens NOT DETECTED NOT DETECTED   Haemophilus influenzae NOT DETECTED NOT DETECTED   Neisseria meningitidis NOT DETECTED NOT DETECTED   Pseudomonas aeruginosa NOT DETECTED NOT DETECTED   Stenotrophomonas maltophilia NOT DETECTED NOT DETECTED   Candida albicans NOT DETECTED NOT DETECTED   Candida auris NOT DETECTED NOT DETECTED   Candida glabrata NOT DETECTED NOT DETECTED   Candida krusei NOT DETECTED NOT DETECTED   Candida parapsilosis NOT DETECTED NOT DETECTED   Candida tropicalis NOT DETECTED NOT DETECTED   Cryptococcus neoformans/gattii NOT DETECTED NOT Keyes E Suzzane Quilter, PharmD, BCPS Clinical Pharmacist   02/22/2021 5:10 PM

## 2021-02-22 NOTE — Consult Note (Signed)
ANTICOAGULATION CONSULT NOTE - Initial Consult  Pharmacy Consult for Warfarin Indication: atrial fibrillation  Allergies  Allergen Reactions   Clopidogrel Anaphylaxis and Other (See Comments)    altered mental status;  (electrolytes "out of wack" and confusion per family; THEY DO NOT REMEMBER ANY OTHER REACTION)- MSB 10/10/15   Ciprofloxacin Itching   Beta Adrenergic Blockers     Nightmares   Mirtazapine Diarrhea    Bad dreams    Spironolactone Other (See Comments)    gynecomastia    Patient Measurements: Height: 5\' 3"  (160 cm) Weight: 89.4 kg (197 lb) IBW/kg (Calculated) : 56.9 Heparin Dosing Weight: 76.6 kg  Vital Signs: BP: 153/74 (12/08 0700) Pulse Rate: 73 (12/08 0700)  Labs: Recent Labs    02/21/21 1127 02/21/21 1944 02/22/21 0500  HGB 12.8* 12.1* 12.3*  HCT 39.3 38.2* 38.7*  PLT 164 167 181  LABPROT  --  19.3* 19.6*  INR  --  1.6* 1.7*  CREATININE 1.83* 1.50* 1.32*     Estimated Creatinine Clearance: 40.5 mL/min (A) (by C-G formula based on SCr of 1.32 mg/dL (H)).   Medical History: Past Medical History:  Diagnosis Date   Arthritis    "probably in his legs" (08/15/2016)   Blind right eye    BPH (benign prostatic hyperplasia)    Breast asymmetry    Left breast is larger, present for several years.   CAD (coronary artery disease)    a. Cath in the late 90's - reportedly ok;  b. 2014 s/p stenting x 2 @ [facility]; c 08/19/16 Cath/PCI with DES -> RCA, plan to treat LM 70% medically. Seen by surgery and felt to be too high risk for CABG; d. 08/2018 Cath: LM 70 (iFR 0.86), LAD 20ost, 40p, 50/83m, D1 70, LCX patent stent, OM1 30, RCA patent stent, 6m ISR, 40d, RPDA 30. PCWP 8. CO/CI 4.0/2.1; e. 03/2019 PCI to LAD (2.5x15 Resolute Onyx DES).   Chronic combined systolic (congestive) and diastolic (congestive) heart failure ([place])    a. Previously reduced EF-->50% by echo in 2012;  b. 06/2015 Echo: EF 50-55%  c. 07/2016 Echo: EF 45-50%; d. 12/2017 Echo: EF 55%; e. 08/2018  Echo: EF 35-40%; f. 04/2019 Echo: EF 30-35%; g. s/p cardiomems; h. 10/2020 Echo: EF 30-35%, mod-sev glob HK, gr1 DD.   Chronic kidney disease (CKD), stage IV (severe) (HCC)    CML (chronic myelocytic leukemia) (HCC)    GERD (gastroesophageal reflux disease)    GIB (gastrointestinal bleeding)    a. 12/2017 3 unit PRBC GIB in setting of coumadin-->Endo/colon multiple duodenal ulcers and a single bleeding ulcer in the proximal ascending colon status post hemostatic clipping x2.   Gout    Hyperlipemia    Hypertension    Hypothyroidism    Iron deficiency anemia    Ischemic cardiomyopathy    a. Previously reduced EF-->50% by echo in 2012;  b. 06/2015 Echo: EF 50-55%, Gr2 DD;  c. 07/2016 Echo: EF 45-50%; d. 12/2017 Echo: EF 55%, Gr1 DD, mild MR; e. 08/2018 Echo: EF 35-40%; f. 04/2019 Echo: EF 30-35%; g. 10/2020 Echo: EF 30-35%.   Migraine    "in the 1960s" (08/15/2016)   Obstructive sleep apnea    Orthostatic hypotension    a. 20222 -->carvedilol and hydralazine d/c'd.   PAF (paroxysmal atrial fibrillation) (HCC)    a. ? Dx 2014-->s/p DCCV;  b. CHA2DS2VASc = 6--> Coumadin.   Prostate cancer ([place])    RBBB    Type II diabetes mellitus ([place])  Medications:  (Not in a hospital admission) Scheduled:   allopurinol  100 mg Oral Daily   aspirin  81 mg Oral Daily   bosutinib  100 mg Oral Q breakfast   heparin  5,000 Units Subcutaneous Q8H   insulin aspart  0-15 Units Subcutaneous TID WC   insulin glargine-yfgn  18 Units Subcutaneous QHS   isosorbide mononitrate  30 mg Oral Daily   levothyroxine  25 mcg Oral Q breakfast   losartan  25 mg Oral Daily   ranolazine  500 mg Oral BID   rosuvastatin  5 mg Oral Daily   tamsulosin  0.4 mg Oral QPC supper   traMADol  50-100 mg Oral Q12H   Warfarin - Pharmacist Dosing Inpatient   Does not apply q1600   Infusions:   sodium chloride 30 mL/hr at 02/21/21 2036   piperacillin-tazobactam Stopped (02/22/21 0934)   [START ON 02/23/2021] vancomycin     PRN:  acetaminophen, hydrALAZINE, nitroGLYCERIN Anti-infectives (From admission, onward)    Start     Dose/Rate Route Frequency Ordered Stop   02/23/21 2000  vancomycin (VANCOREADY) IVPB 1250 mg/250 mL  Status:  Discontinued        1,250 mg 166.7 mL/hr over 90 Minutes Intravenous Every 48 hours 02/21/21 2040 02/22/21 1006   02/23/21 2000  vancomycin (VANCOREADY) IVPB 1500 mg/300 mL        1,500 mg 150 mL/hr over 120 Minutes Intravenous Every 48 hours 02/22/21 1006     02/22/21 0600  piperacillin-tazobactam (ZOSYN) IVPB 3.375 g        3.375 g 12.5 mL/hr over 240 Minutes Intravenous Every 8 hours 02/21/21 1932     02/21/21 1945  vancomycin (VANCOCIN) IVPB 1000 mg/200 mL premix        1,000 mg 200 mL/hr over 60 Minutes Intravenous  Once 02/21/21 1943 02/21/21 2153   02/21/21 1715  vancomycin (VANCOCIN) IVPB 1000 mg/200 mL premix        1,000 mg 200 mL/hr over 60 Minutes Intravenous  Once 02/21/21 1710 02/21/21 1939   02/21/21 1715  piperacillin-tazobactam (ZOSYN) IVPB 3.375 g        3.375 g 100 mL/hr over 30 Minutes Intravenous  Once 02/21/21 1710 02/21/21 1832       Assessment: Pharmacy has been consulted to monitor and dose warfarin in 87yo patient with history of atrial fibrillation. Patient's home dose of Warfarin: alternates between 4.5mg  and 3mg  daily. Last dose taken was 12/6 in the evening.  DDI: new antibiotics started: Zosyn/Vancomycin 12/7 >   Date INR Warfarin Dose  12/6 -- 3 mg (PTA) 12/7 1.6 4.5 mg  12/7 1.8 5 mg   Goal of Therapy:  INR 2-3 Monitor platelets by anticoagulation protocol: Yes   Plan:  INR subtherapeutic on admission --Will order increased dose Warfarin 5mg  x 1 dose for this evening. --Will recheck INR with AM labs, CBC daily  Dorothe Pea, PharmD, BCPS Clinical Pharmacist   02/22/2021,10:06 AM

## 2021-02-22 NOTE — Consult Note (Signed)
Pharmacy Antibiotic Note  Lucas Caldwell is a 87 y.o. male admitted on 02/21/2021 with cellulitis.  Pharmacy has been consulted for Vancomycin dosing. Patient is also receiving Zosyn.  Plan: Continue Zosyn 3.375 g Q8H Increase Vancomycin from 1250 mg IV Q 48 hrs to 1500 mg Q48H. Goal AUC 400-550. Expected AUC: 528 SCr used: 1.32   Height: 5\' 3"  (160 cm) Weight: 89.4 kg (197 lb) IBW/kg (Calculated) : 56.9  Temp (24hrs), Avg:98.6 F (37 C), Min:98.6 F (37 C), Max:98.6 F (37 C)  Recent Labs  Lab 02/21/21 1127 02/21/21 1800 02/21/21 1944 02/22/21 0500  WBC 10.5  --  11.1* 11.6*  CREATININE 1.83*  --  1.50* 1.32*  LATICACIDVEN 1.6 1.2  --   --      Estimated Creatinine Clearance: 40.5 mL/min (A) (by C-G formula based on SCr of 1.32 mg/dL (H)).    Allergies  Allergen Reactions   Clopidogrel Anaphylaxis and Other (See Comments)    altered mental status;  (electrolytes "out of wack" and confusion per family; THEY DO NOT REMEMBER ANY OTHER REACTION)- MSB 10/10/15   Ciprofloxacin Itching   Beta Adrenergic Blockers     Nightmares   Mirtazapine Diarrhea    Bad dreams    Spironolactone Other (See Comments)    gynecomastia    Antimicrobials this admission: Vancomycin 12/7 >>  Zosyn 12/7 >>   Microbiology results: 12/7 BCx: pending  Thank you for allowing pharmacy to be a part of this patient's care.  Dorothe Pea, PharmD, BCPS Clinical Pharmacist   02/22/2021 10:03 AM

## 2021-02-22 NOTE — TOC Initial Note (Signed)
Transition of Care [facility]) - Initial/Assessment Note    Patient Details  Name: Lucas Caldwell MRN: [id number] Date of Birth: 1933/06/30  [facility]) CM/SW Contact:    Pete Pelt, RN Phone Number: 02/22/2021, 5:19 PM  Clinical Narrative:  Patient lives at home with wife, who can assist him as needed.  He does not have home health, but does walk with a walker at home.  Patient has no concerns about obtaining medications and taking them as directed.  Wife states that patient usually drives to appointments, however they are unable to drive to [facility] and will be unable to drive due to this admission.  She states that the last time he had an appointment in [place], they called [employer] and took an uber.  Patient has an appt 12/14 in [place] to obtain a cpap for patient and would like transportation resources.  Wife states patient is due to have surgery tomorrow and would like to see what recommendations are for discharge following surgery. TOC contact information provided, TOC to follow to discharge.                  Expected Discharge Plan:  (TBD) Barriers to Discharge: Continued Medical Work up   Patient Goals and CMS Choice        Expected Discharge Plan and Services Expected Discharge Plan:  (TBD)   Discharge Planning Services: CM Consult   Living arrangements for the past 2 months: Single Family Home                                      Prior Living Arrangements/Services Living arrangements for the past 2 months: Single Family Home Lives with:: Spouse Patient language and need for interpreter reviewed:: Yes (No interpreter required)        Need for Family Participation in Patient Care: Yes (Comment) Care giver support system in place?: Yes (comment) Current home services: DME Criminal Activity/Legal Involvement Pertinent to Current Situation/Hospitalization: No - Comment as needed  Activities of Daily Living Home Assistive  Devices/Equipment: Hearing aid, Cane (specify quad or straight), CBG Meter, Eyeglasses, Dentures (specify type) ADL Screening (condition at time of admission) Patient's cognitive ability adequate to safely complete daily activities?: Yes Is the patient deaf or have difficulty hearing?: Yes Does the patient have difficulty seeing, even when wearing glasses/contacts?: No Does the patient have difficulty concentrating, remembering, or making decisions?: No Patient able to express need for assistance with ADLs?: Yes Does the patient have difficulty dressing or bathing?: No Independently performs ADLs?: Yes (appropriate for developmental age) Does the patient have difficulty walking or climbing stairs?: Yes Weakness of Legs: Both Weakness of Arms/Hands: None  Permission Sought/Granted                  Emotional Assessment Appearance:: Appears stated age Attitude/Demeanor/Rapport: Gracious, Engaged Affect (typically observed): Pleasant, Appropriate Orientation: : Oriented to Situation, Oriented to  Time, Oriented to Place, Oriented to Self Alcohol / Substance Use: Not Applicable Psych Involvement: No (comment)  Admission diagnosis:  Cellulitis of foot [L03.119] Diabetic foot ulcer ([place]) [O37.858, L97.509] Diabetic foot infection ([place]) [I50.277, L08.9] Ulcerated, foot, right, with necrosis of bone (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 ([place]) 08/24/2018   CKD (chronic kidney disease), stage IV ([place]) 06/04/2018   Osteoarthritis of knees, bilateral 06/02/2018   Hyperlipidemia associated with type 2 diabetes mellitus ([place]) 05/06/2018   Pain of lower extremity 03/06/2018   Duodenitis 01/30/2018   Hematochezia    Melena    Rectal bleeding 01/11/2018   AVM (arteriovenous malformation) of colon without hemorrhage    Schatzki's ring    Esophageal dysphagia    Diabetic retinopathy ([place]) 07/08/2017   Ischemic cardiomyopathy 06/26/2017   Cardiomyopathy ([place]) 06/26/2017   Centrilobular emphysema ([place]) 06/13/2017   Fatigue 12/18/2016   Stable angina (HCC) 09/20/2016   Stage 3a chronic kidney disease ([place]) 09/20/2016   Paroxysmal atrial fibrillation ([place]) 08/28/2016   Chronic anticoagulation 08/28/2016   Essential hypertension 08/20/2016   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 heart with stable angina pectoris ([place]) 08/19/2016   Accelerating angina ([place]) 08/15/2016   Exertional dyspnea 08/12/2016   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[place]) 12/29/2015   Ulcerated, foot, right, with necrosis of bone ([place]) 09/26/2015   Gynecomastia, male 03/31/2014   Hypertrophy of breast 03/31/2014   Nuclear sclerosis of left eye 10/22/2013   Enthesopathy of ankle and tarsus 04/10/2013   Controlled type 2 diabetes mellitus with diabetic nephropathy, with long-term current use of insulin ([place]) 04/09/2013   Diabetic neuropathy associated with type 2 diabetes mellitus ([place]) 04/09/2013   Cataract of left eye 11/25/2011   Corneal opacity 09/30/2011   Pseudophakia of right eye 09/30/2011   GERD (gastroesophageal reflux disease) 09/25/2011   Gout 09/25/2011   Hyperlipidemia LDL goal <70 09/25/2011   Status post cataract extraction 04/11/2011   Cataract extraction status of eye 04/11/2011   Iron deficiency anemia 04/01/2011   Benign localized hyperplasia of prostate without urinary obstruction and other lower urinary tract symptoms  (LUTS) 12/10/2010   Enlarged prostate without lower urinary tract symptoms (luts) 12/10/2010   Colon cancer screening 10/11/2010   Hypothyroidism 10/11/2010   Hyperlipidemia 09/20/2003   Essential and other specified forms of tremor 01/05/2001   Essential tremor 01/05/2001   PCP:  [name], DO Pharmacy:   [place], [place], [place] [address] [place] [address] Phone: [phone] Fax: [place], [place] [address] [place] [address] Phone: [phone] Fax: [phone]  [facility] [place], [place] [address] Phone: [phone] Fax: [phone]  [facility] [place], [place] - [address] [place] [address] Phone: [phone] Fax: [phone]     Social Determinants of Health (SDOH) Interventions    Readmission Risk Interventions No flowsheet data found.

## 2021-02-22 NOTE — Consult Note (Signed)
@LOGO @   MRN : [id number]  Lucas Caldwell is a 87 y.o. ([birth year]) male who presents with chief complaint of ulceration of the right great toe.  History of Present Illness:  I am asked to evaluate the patient by Dr. Vickki Muff.  Patient is an 87 year old gentleman who was sent from podiatry to the emergency room after evaluation demonstrated changes consistent with osteomyelitis of the great toe and therefore the need for appropriate antibiotic therapy.  Patient states that this has been going on for quite a while but recently worsened became more angry.  He notes that there was increasing redness which was advancing proximally.  Prior to developing his toe ulcer he denies claudication symptoms.  There is no history of vascular angiography and interventions or prior vascular surgeries.  Current Meds  Medication Sig   allopurinol (ZYLOPRIM) 100 MG tablet TAKE ONE TABLET BY MOUTH ONCE DAILY (Patient taking differently: Take 100 mg by mouth daily.)   aspirin 81 MG chewable tablet Chew 81 mg by mouth daily.   bosutinib (BOSULIF) 100 MG tablet Take 100 mg by mouth at bedtime. Take with food.   Cholecalciferol 25 MCG (1000 UT) capsule Take 1,000 Units by mouth daily.   gabapentin (NEURONTIN) 600 MG tablet Take 600 mg by mouth 3 (three) times daily.   Insulin Glargine ([name] KWIKPEN) 100 UNIT/ML Inject 18 Units into the skin daily.   isosorbide mononitrate (IMDUR) 60 MG 24 hr tablet Take 0.5 tablets (30 mg total) by mouth daily.   JARDIANCE 10 MG TABS tablet Take 1 tablet (10 mg total) by mouth daily with breakfast.   levothyroxine (SYNTHROID) 25 MCG tablet TAKE ONE TABLET BY MOUTH EVERY MORNING BEFORE BREAKFAST (Patient taking differently: Take 25 mcg by mouth daily before breakfast.)   losartan (COZAAR) 25 MG tablet Take 12.5 mg by mouth daily.   Multiple Vitamin (MULTIVITAMIN WITH MINERALS) TABS tablet Take 1 tablet by mouth daily.   omeprazole (PRILOSEC) 20 MG capsule Take 20 mg by mouth  in the morning.    potassium chloride (KLOR-CON) 10 MEQ tablet Take 1 tablet (10 mEq total) by mouth daily.   ranolazine (RANEXA) 500 MG 12 hr tablet TAKE ONE TABLET BY MOUTH TWICE DAILY (Patient taking differently: Take 500 mg by mouth 2 (two) times daily.)   rosuvastatin (CRESTOR) 5 MG tablet TAKE ONE TABLET BY MOUTH ONCE DAILY (Patient taking differently: Take 5 mg by mouth daily.)   tamsulosin (FLOMAX) 0.4 MG CAPS capsule Take 1 capsule (0.4 mg total) by mouth daily after supper.   torsemide (DEMADEX) 20 MG tablet Take 3 tablets (60 mg total) by mouth daily.   traMADol (ULTRAM) 50 MG tablet Take 1-2 tablets (50-100 mg total) by mouth at bedtime as needed.   warfarin (COUMADIN) 3 MG tablet TAKE 1 TO 1 & 1/2 TABLETS BY MOUTH AS DIRECTED BY THE COUMADIN CLINIC    Past Medical History:  Diagnosis Date   Arthritis    "probably in his legs" (08/15/2016)   Blind right eye    BPH (benign prostatic hyperplasia)    Breast asymmetry    Left breast is larger, present for several years.   CAD (coronary artery disease)    a. Cath in the late 90's - reportedly ok;  b. 2014 s/p stenting x 2 @ [facility]; c 08/19/16 Cath/PCI with DES -> RCA, plan to treat LM 70% medically. Seen by surgery and felt to be too high risk for CABG; d. 08/2018 Cath: LM 70 (iFR 0.86), LAD 20ost,  40p, 50/78m, D1 70, LCX patent stent, OM1 30, RCA patent stent, 46m ISR, 40d, RPDA 30. PCWP 8. CO/CI 4.0/2.1; e. 03/2019 PCI to LAD (2.5x15 Resolute Onyx DES).   Chronic combined systolic (congestive) and diastolic (congestive) heart failure ([place])    a. Previously reduced EF-->50% by echo in 2012;  b. 06/2015 Echo: EF 50-55%  c. 07/2016 Echo: EF 45-50%; d. 12/2017 Echo: EF 55%; e. 08/2018 Echo: EF 35-40%; f. 04/2019 Echo: EF 30-35%; g. s/p cardiomems; h. 10/2020 Echo: EF 30-35%, mod-sev glob HK, gr1 DD.   Chronic kidney disease (CKD), stage IV (severe) (HCC)    CML (chronic myelocytic leukemia) (HCC)    GERD (gastroesophageal reflux disease)    GIB  (gastrointestinal bleeding)    a. 12/2017 3 unit PRBC GIB in setting of coumadin-->Endo/colon multiple duodenal ulcers and a single bleeding ulcer in the proximal ascending colon status post hemostatic clipping x2.   Gout    Hyperlipemia    Hypertension    Hypothyroidism    Iron deficiency anemia    Ischemic cardiomyopathy    a. Previously reduced EF-->50% by echo in 2012;  b. 06/2015 Echo: EF 50-55%, Gr2 DD;  c. 07/2016 Echo: EF 45-50%; d. 12/2017 Echo: EF 55%, Gr1 DD, mild MR; e. 08/2018 Echo: EF 35-40%; f. 04/2019 Echo: EF 30-35%; g. 10/2020 Echo: EF 30-35%.   Migraine    "in the 1960s" (08/15/2016)   Obstructive sleep apnea    Orthostatic hypotension    a. 20222 -->carvedilol and hydralazine d/c'd.   PAF (paroxysmal atrial fibrillation) (HCC)    a. ? Dx 2014-->s/p DCCV;  b. CHA2DS2VASc = 6--> Coumadin.   Prostate cancer ([place])    RBBB    Type II diabetes mellitus ([place])     Past Surgical History:  Procedure Laterality Date   CATARACT EXTRACTION W/ INTRAOCULAR LENS  IMPLANT, BILATERAL Bilateral    COLONOSCOPY N/A 01/13/2018   Procedure: COLONOSCOPY;  Surgeon: Lin Landsman, MD;  Location: [facility] ENDOSCOPY;  Service: Gastroenterology;  Laterality: N/A;   COLONOSCOPY WITH PROPOFOL N/A 01/01/2018   Procedure: COLONOSCOPY WITH PROPOFOL;  Surgeon: Lin Landsman, MD;  Location: [facility] ENDOSCOPY;  Service: Gastroenterology;  Laterality: N/A;   CORONARY ANGIOPLASTY WITH STENT PLACEMENT  09/2012   2 stents   CORONARY STENT INTERVENTION N/A 08/19/2016   Procedure: Coronary Stent Intervention;  Surgeon: [name], MD;  Location: [place] CV LAB;  Service: Cardiovascular;  Laterality: N/A;   CORONARY STENT INTERVENTION N/A 04/13/2019   Procedure: CORONARY STENT INTERVENTION;  Surgeon: [name], MD;  Location: [place] CV LAB;  Service: Cardiovascular;  Laterality: N/A;   ESOPHAGEAL MANOMETRY N/A 12/16/2017   Procedure: ESOPHAGEAL MANOMETRY (EM);  Surgeon: Lin Landsman, MD;  Location: [facility] ENDOSCOPY;  Service: Gastroenterology;  Laterality: N/A;   ESOPHAGOGASTRODUODENOSCOPY N/A 12/20/2016   Procedure: ESOPHAGOGASTRODUODENOSCOPY (EGD);  Surgeon: Lin Landsman, MD;  Location[name] ENDOSCOPY;  Service: Gastroenterology;  Laterality: N/A;   ESOPHAGOGASTRODUODENOSCOPY N/A 01/13/2018   Procedure: ESOPHAGOGASTRODUODENOSCOPY (EGD);  Surgeon: Lin Landsman, MD;  Location: [facility] ENDOSCOPY;  Service: Gastroenterology;  Laterality: N/A;   ESOPHAGOGASTRODUODENOSCOPY (EGD) WITH PROPOFOL N/A 11/03/2017   Procedure: ESOPHAGOGASTRODUODENOSCOPY (EGD) WITH PROPOFOL;  Surgeon: Lin Landsman, MD;  Location: [facility] ENDOSCOPY;  Service: Gastroenterology;  Laterality: N/A;   EYE SURGERY     HERNIA REPAIR     "navel"   LEFT HEART CATH AND CORONARY ANGIOGRAPHY N/A 08/14/2016   Procedure: Left Heart Cath and Coronary Angiography;  Surgeon: [name], MD;  Location: [place] CV LAB;  Service: Cardiovascular;  Laterality: N/A;   PRESSURE SENSOR/CARDIOMEMS N/A 09/23/2019   Procedure: PRESSURE SENSOR/CARDIOMEMS;  Surgeon: Jolaine Artist, MD;  Location: [place] CV LAB;  Service: Cardiovascular;  Laterality: N/A;   RIGHT HEART CATH N/A 09/23/2019   Procedure: RIGHT HEART CATH;  Surgeon: Jolaine Artist, MD;  Location: [place] CV LAB;  Service: Cardiovascular;  Laterality: N/A;   RIGHT/LEFT HEART CATH AND CORONARY ANGIOGRAPHY N/A 09/01/2018   Procedure: RIGHT/LEFT HEART CATH AND CORONARY ANGIOGRAPHY;  Surgeon: [name], MD;  Location: [place] CV LAB;  Service: Cardiovascular;  Laterality: N/A;   RIGHT/LEFT HEART CATH AND CORONARY ANGIOGRAPHY N/A 04/13/2019   Procedure: RIGHT/LEFT HEART CATH AND CORONARY ANGIOGRAPHY;  Surgeon: [name], MD;  Location: [place] CV LAB;  Service: Cardiovascular;  Laterality: N/A;   TOE AMPUTATION Right    "big toe"    Social History Social History   Tobacco Use   Smoking status: Former     Packs/day: 1.75    Years: 20.00    Pack years: 35.00    Types: Cigarettes    Start date: 86    Quit date: 1975    Years since quitting: 47.9   Smokeless tobacco: Former  Scientific laboratory technician Use: Never used  Substance Use Topics   Alcohol use: No    Comment: previously drank heavily - quit 1979.   Drug use: No    Family History Family History  Problem Relation Age of Onset   Brain cancer Father    Diabetes Sister    Heart attack Sister     Allergies  Allergen Reactions   Clopidogrel Anaphylaxis and Other (See Comments)    altered mental status;  (electrolytes "out of wack" and confusion per family; THEY DO NOT REMEMBER ANY OTHER REACTION)- MSB 10/10/15   Ciprofloxacin Itching   Beta Adrenergic Blockers     Nightmares   Mirtazapine Diarrhea    Bad dreams    Spironolactone Other (See Comments)    gynecomastia     REVIEW OF SYSTEMS (Negative unless checked)  Constitutional: [] Weight loss  [] Fever  [] Chills Cardiac: [] Chest pain   [] Chest pressure   [] Palpitations   [] Shortness of breath when laying flat   [] Shortness of breath with exertion. Vascular:  [] Pain in legs with walking   [] Pain in legs at rest  [] History of DVT   [] Phlebitis   [] Swelling in legs   [] Varicose veins   [] Non-healing ulcers Pulmonary:   [] Uses home oxygen   [] Productive cough   [] Hemoptysis   [] Wheeze  [] COPD   [] Asthma Neurologic:  [] Dizziness   [] Seizures   [] History of stroke   [] History of TIA  [] Aphasia   [] Vissual changes   [] Weakness or numbness in arm   [] Weakness or numbness in leg Musculoskeletal:   [] Joint swelling   [] Joint pain   [] Low back pain Hematologic:  [] Easy bruising  [] Easy bleeding   [] Hypercoagulable state   [] Anemic Gastrointestinal:  [] Diarrhea   [] Vomiting  [] Gastroesophageal reflux/heartburn   [] Difficulty swallowing. Genitourinary:  [] Chronic kidney disease   [] Difficult urination  [] Frequent urination   [] Blood in urine Skin:  [] Rashes   [] Ulcers  Psychological:   [] History of anxiety   []  History of major depression.  Physical Examination  Vitals:   02/22/21 0700 02/22/21 1000 02/22/21 1136 02/22/21 1607  BP: (!) 153/74 (!) 149/90 (!) 151/87 138/85  Pulse: 73 90 78 83  Resp:  19 16 18  Temp:  97.9 F (36.6 C) 97.9 F (36.6 C) 97.9 F (36.6 C)  TempSrc:  Oral    SpO2: 96% 99% 100% 99%  Weight:      Height:       Body mass index is 34.9 kg/m. Gen: WD/WN, NAD Head: [place]/AT, No temporalis wasting.  Ear/Nose/Throat: Hearing grossly intact, nares w/o erythema or drainage Eyes: PER, EOMI, sclera nonicteric.  Neck: Supple, no masses.  No bruit or JVD.  Pulmonary:  Good air movement, no audible wheezing, no use of accessory muscles.  Cardiac: RRR, normal S1, S2, no Murmurs. Vascular: Right great toe with changes consistent with chronic osteomyelitis; both feet are warm to the touch with brisk capillary refill.  On interrogation of the right foot with a Doppler he has very brisk signals that sound triphasic. Vessel Right Left  Radial Palpable Palpable  PT Not palpable Not palpable  DP Not palpable Not palpable  Gastrointestinal: soft, non-distended. No guarding/no peritoneal signs.  Musculoskeletal: M/S 5/5 throughout.  No visible deformity.  Neurologic: CN 2-12 intact. Pain and light touch intact in extremities.  Symmetrical.  Speech is fluent. Motor exam as listed above. Psychiatric: Judgment intact, Mood & affect appropriate for pt's clinical situation. Dermatologic: No rashes right great toe ulcers noted.  No changes consistent with cellulitis.   CBC Lab Results  Component Value Date   WBC 11.6 (H) 02/22/2021   HGB 12.3 (L) 02/22/2021   HCT 38.7 (L) 02/22/2021   MCV 86.6 02/22/2021   PLT 181 02/22/2021    BMET    Component Value Date/Time   NA 133 (L) 02/22/2021 0500   NA 134 06/29/2020 1411   NA 134 (L) 05/18/2012 0440   K 3.8 02/22/2021 0500   K 4.2 05/18/2012 0440   CL 101 02/22/2021 0500   CL 103 05/18/2012 0440   CO2 24  02/22/2021 0500   CO2 25 05/18/2012 0440   GLUCOSE 83 02/22/2021 0500   GLUCOSE 220 (H) 05/18/2012 0440   BUN 28 (H) 02/22/2021 0500   BUN 22 06/29/2020 1411   BUN 38 (H) 05/18/2012 0440   CREATININE 1.32 (H) 02/22/2021 0500   CREATININE 1.72 (H) 04/13/2020 1117   CALCIUM 8.5 (L) 02/22/2021 0500   CALCIUM 8.4 (L) 05/18/2012 0440   GFRNONAA 53 (L) 02/22/2021 0500   GFRNONAA 36 (L) 04/13/2020 1117   GFRAA 41 (L) 04/13/2020 1117   Estimated Creatinine Clearance: 40.5 mL/min (A) (by C-G formula based on SCr of 1.32 mg/dL (H)).  COAG Lab Results  Component Value Date   INR 1.7 (H) 02/22/2021   INR 1.6 (H) 02/21/2021   INR 1.9 (A) 01/31/2021    Radiology US ARTERIAL ABI (SCREENING LOWER EXTREMITY)  Result Date: 02/22/2021 CLINICAL DATA:  87 year old male with a history of cardiac disease EXAM: NONINVASIVE PHYSIOLOGIC VASCULAR STUDY OF BILATERAL LOWER EXTREMITIES TECHNIQUE: Evaluation of both lower extremities was performed at rest, including calculation of ankle-brachial indices, multiple segmental pressure evaluation, segmental Doppler and segmental pulse volume recording. COMPARISON:  None. FINDINGS: Right ABI:  0.48 Left ABI:  Noncompressible Right Lower Extremity: Segmental Doppler at the right ankle demonstrates monophasic waveform of the dorsalis pedis and biphasic blunted posterior tibial waveform Left Lower Extremity: Segmental Doppler at the left ankle demonstrates monophasic waveforms IMPRESSION: Right: Resting ABI in the moderate range arterial occlusive disease. Segmental Doppler at the right ankle demonstrates evidence of more proximal occlusive disease. Left: Resting ABI noncompressible Segmental Doppler at the left ankle demonstrates evidence of more proximal occlusive  disease Signed, Dulcy Fanny. Dellia Nims, RPVI [facility] Radiology Electronically Signed   By: Corrie Mckusick D.O.   On: 02/22/2021 08:56   DG Foot 2 Views  Right  Result Date: 02/21/2021 CLINICAL DATA:  Toe infection EXAM: RIGHT FOOT - 2 VIEW COMPARISON:  None. FINDINGS: No fracture or malalignment. Vascular calcifications. No soft tissue emphysema. Previous amputation at the level of the first IP joint. No erosive change or osseous destruction IMPRESSION: Partial amputation first digit.  No acute osseous abnormality Electronically Signed   By: Donavan Foil M.D.   On: 02/21/2021 17:36     Assessment/Plan 1.  Atherosclerotic occlusive disease bilateral lower extremities with ulceration of the right foot:   The patient has osteomyelitis of his right great toe and is in need of further debridement as well as antibiotic therapy.  Clinically he appears to have fairly good perfusion and therefore I would proceed with his surgical debridement and closure of his wound pending findings at surgery.  I would then follow to see his progress and if there is any suggestion that his wound is not doing well or that he is not healing angiography would be most appropriate.  I discussed this with the patient and his wife.  They agree with this plan.  We will move forward with surgery with Dr. Vickki Muff tomorrow and then reassess his situation. 2.  Chronic renal insufficiency stage IV:   At the present time the patient does not require dialysis and therefore he does not require dialysis access.  Avoid nephrotoxic medications, which is another reason to move forward with his foot surgery and attempt not to expose him to contrast if possible.  Also avoid dehydration.  Further plans per nephrology   3.  Hypertension:  Patient will continue medical management; nephrology is following no changes in oral medications.  4. Diabetes mellitus:  Glucose will be monitored and oral medications been held this morning once the patient has undergone the patient's procedure po intake will be reinitiated and again Accu-Cheks will be used to assess the blood glucose level and treat as needed.  The patient will be restarted on the patient's usual hypoglycemic regime  5.  Coronary artery disease:  EKG will be monitored. Nitrates will be used if needed. The patient's oral cardiac medications will be continued.     Hortencia Pilar, MD  02/22/2021 5:00 PM

## 2021-02-22 NOTE — TOC Benefit Eligibility Note (Signed)
Patient Teacher, English as a foreign language completed.    The patient is currently admitted and upon discharge could be taking Eliquis 2.5 mg.  The current 30 day co-pay is, $45.00.   The patient is currently admitted and upon discharge could be taking Xarelto 15 mg.  The current 30 day co-pay is, $45.00.   The patient is insured through [place], [place] Patient Advocate Specialist [place] Patient Advocate Team Direct Number: [phone]  Fax: [phone]

## 2021-02-23 ENCOUNTER — Encounter: Payer: Self-pay | Admitting: Internal Medicine

## 2021-02-23 ENCOUNTER — Inpatient Hospital Stay: Payer: Medicare HMO | Admitting: Certified Registered"

## 2021-02-23 ENCOUNTER — Other Ambulatory Visit: Payer: Self-pay

## 2021-02-23 ENCOUNTER — Encounter
Admission: EM | Disposition: A | Discharge status: Home Health | Payer: Self-pay | Source: Ambulatory Visit | Attending: Student in an Organized Health Care Education/Training Program

## 2021-02-23 DIAGNOSIS — M869 Osteomyelitis, unspecified: Secondary | ICD-10-CM

## 2021-02-23 HISTORY — PX: AMPUTATION TOE: SHX6595

## 2021-02-23 LAB — BASIC METABOLIC PANEL
Anion gap: 7 (ref 5–15)
BUN: 19 mg/dL (ref 8–23)
CO2: 22 mmol/L (ref 22–32)
Calcium: 8.6 mg/dL — ABNORMAL LOW (ref 8.9–10.3)
Chloride: 106 mmol/L (ref 98–111)
Creatinine, Ser: 1.23 mg/dL (ref 0.61–1.24)
GFR, Estimated: 58 mL/min — ABNORMAL LOW (ref >60)
Glucose, Bld: 90 mg/dL (ref 70–99)
Potassium: 4 mmol/L (ref 3.5–5.1)
Sodium: 135 mmol/L (ref 135–145)

## 2021-02-23 LAB — HEMOGLOBIN A1C
Hgb A1c MFr Bld: 7.3 % — ABNORMAL HIGH (ref 4.8–5.6)
Mean Plasma Glucose: 163 mg/dL

## 2021-02-23 LAB — GLUCOSE, CAPILLARY
Glucose-Capillary: 78 mg/dL (ref 70–99)
Glucose-Capillary: 125 mg/dL — ABNORMAL HIGH (ref 70–99)
Glucose-Capillary: 142 mg/dL — ABNORMAL HIGH (ref 70–99)
Glucose-Capillary: 201 mg/dL — ABNORMAL HIGH (ref 70–99)

## 2021-02-23 LAB — PROTIME-INR
INR: 1.7 — ABNORMAL HIGH (ref 0.8–1.2)
Prothrombin Time: 20.3 seconds — ABNORMAL HIGH (ref 11.4–15.2)

## 2021-02-23 SURGERY — AMPUTATION, TOE
Anesthesia: General | Site: Toe | Laterality: Right

## 2021-02-23 MED ORDER — LORAZEPAM 2 MG/ML IJ SOLN
1.0000 mg | Freq: Once | INTRAMUSCULAR | Status: AC
  Administered 2021-02-23: 1 mg via INTRAVENOUS
  Filled 2021-02-23: qty 1

## 2021-02-23 MED ORDER — 0.9 % SODIUM CHLORIDE (POUR BTL) OPTIME
TOPICAL | Status: DC | PRN
  Administered 2021-02-23: 500 mL

## 2021-02-23 MED ORDER — DEXAMETHASONE SODIUM PHOSPHATE 10 MG/ML IJ SOLN
INTRAMUSCULAR | Status: DC | PRN
  Administered 2021-02-23: 10 mg via INTRAVENOUS

## 2021-02-23 MED ORDER — QUETIAPINE FUMARATE 25 MG PO TABS
25.0000 mg | ORAL_TABLET | Freq: Once | ORAL | Status: AC
  Administered 2021-02-23: 25 mg via ORAL
  Filled 2021-02-23: qty 1

## 2021-02-23 MED ORDER — DEXTROSE 50 % IV SOLN: 25.0000 mL | Freq: Once | INTRAVENOUS | Status: AC

## 2021-02-23 MED ORDER — HALOPERIDOL LACTATE 5 MG/ML IJ SOLN
2.0000 mg | Freq: Once | INTRAMUSCULAR | Status: AC
  Administered 2021-02-23: 2 mg via INTRAMUSCULAR
  Filled 2021-02-23 (×2): qty 1

## 2021-02-23 MED ORDER — DEXTROSE 50 % IV SOLN
INTRAVENOUS | Status: AC
  Administered 2021-02-23: 25 mL via INTRAVENOUS
  Filled 2021-02-23: qty 50

## 2021-02-23 MED ORDER — KETAMINE HCL 10 MG/ML IJ SOLN
INTRAMUSCULAR | Status: DC | PRN
  Administered 2021-02-23: 20 mg via INTRAVENOUS

## 2021-02-23 MED ORDER — LIDOCAINE HCL (PF) 1 % IJ SOLN
INTRAMUSCULAR | Status: AC
  Filled 2021-02-23: qty 30

## 2021-02-23 MED ORDER — SODIUM CHLORIDE 0.9 % IV SOLN: INTRAVENOUS | Status: DC | PRN

## 2021-02-23 MED ORDER — KETAMINE HCL 50 MG/5ML IJ SOSY
PREFILLED_SYRINGE | INTRAMUSCULAR | Status: AC
  Filled 2021-02-23: qty 5

## 2021-02-23 MED ORDER — PROPOFOL 1000 MG/100ML IV EMUL
INTRAVENOUS | Status: AC
  Filled 2021-02-23: qty 100

## 2021-02-23 MED ORDER — PROPOFOL 500 MG/50ML IV EMUL
INTRAVENOUS | Status: DC | PRN
  Administered 2021-02-23: 100 ug/kg/min via INTRAVENOUS

## 2021-02-23 MED ORDER — ONDANSETRON HCL 4 MG/2ML IJ SOLN
INTRAMUSCULAR | Status: DC | PRN
  Administered 2021-02-23: 4 mg via INTRAVENOUS

## 2021-02-23 MED ORDER — GLYCOPYRROLATE 0.2 MG/ML IJ SOLN
INTRAMUSCULAR | Status: DC | PRN
  Administered 2021-02-23: .2 mg via INTRAVENOUS

## 2021-02-23 MED ORDER — FENTANYL CITRATE (PF) 100 MCG/2ML IJ SOLN: 25.0000 ug | INTRAMUSCULAR | Status: DC | PRN

## 2021-02-23 MED ORDER — BUPIVACAINE HCL 0.5 % IJ SOLN
INTRAMUSCULAR | Status: DC | PRN
  Administered 2021-02-23: 10 mL

## 2021-02-23 MED ORDER — PROPOFOL 10 MG/ML IV BOLUS
INTRAVENOUS | Status: DC | PRN
  Administered 2021-02-23: 30 mg via INTRAVENOUS
  Administered 2021-02-23: 20 mg via INTRAVENOUS

## 2021-02-23 MED ORDER — HALOPERIDOL 2 MG PO TABS
2.0000 mg | ORAL_TABLET | Freq: Once | ORAL | Status: AC
  Filled 2021-02-23: qty 1

## 2021-02-23 MED ORDER — FENTANYL CITRATE (PF) 100 MCG/2ML IJ SOLN
INTRAMUSCULAR | Status: AC
  Filled 2021-02-23: qty 2

## 2021-02-23 MED ORDER — BUPIVACAINE HCL (PF) 0.5 % IJ SOLN
INTRAMUSCULAR | Status: AC
  Filled 2021-02-23: qty 30

## 2021-02-23 SURGICAL SUPPLY — 46 items
BLADE OSC/SAGITTAL MD 5.5X18 (BLADE) ×1 IMPLANT
BLADE SURG MINI STRL (BLADE) ×2 IMPLANT
BNDG CMPR STD VLCR NS LF 5.8X4 (GAUZE/BANDAGES/DRESSINGS) ×1
BNDG CONFORM 2 STRL LF (GAUZE/BANDAGES/DRESSINGS) ×1 IMPLANT
BNDG CONFORM 3 STRL LF (GAUZE/BANDAGES/DRESSINGS) ×2 IMPLANT
BNDG ELASTIC 4X5.8 VLCR NS LF (GAUZE/BANDAGES/DRESSINGS) ×2 IMPLANT
BNDG ESMARK 4X12 TAN STRL LF (GAUZE/BANDAGES/DRESSINGS) ×2 IMPLANT
BNDG GAUZE ELAST 4 BULKY (GAUZE/BANDAGES/DRESSINGS) ×2 IMPLANT
CUFF TOURN SGL QUICK 12 (TOURNIQUET CUFF) IMPLANT
CUFF TOURN SGL QUICK 18X4 (TOURNIQUET CUFF) IMPLANT
DRAPE FLUOR MINI C-ARM 54X84 (DRAPES) ×2 IMPLANT
DRAPE XRAY CASSETTE 23X24 (DRAPES) ×2 IMPLANT
DURAPREP 26ML APPLICATOR (WOUND CARE) ×2 IMPLANT
ELECT REM PT RETURN 9FT ADLT (ELECTROSURGICAL) ×2
ELECTRODE REM PT RTRN 9FT ADLT (ELECTROSURGICAL) ×1 IMPLANT
GAUZE 4X4 16PLY ~~LOC~~+RFID DBL (SPONGE) ×2 IMPLANT
GAUZE PACKING IODOFORM 1/2 (PACKING) ×1 IMPLANT
GAUZE SPONGE 4X4 12PLY STRL (GAUZE/BANDAGES/DRESSINGS) ×2 IMPLANT
GAUZE XEROFORM 1X8 LF (GAUZE/BANDAGES/DRESSINGS) ×2 IMPLANT
GLOVE SURG ENC MOIS LTX SZ7.5 (GLOVE) ×2 IMPLANT
GLOVE SURG UNDER LTX SZ8 (GLOVE) ×2 IMPLANT
GOWN STRL REUS W/ TWL XL LVL3 (GOWN DISPOSABLE) ×2 IMPLANT
GOWN STRL REUS W/TWL XL LVL3 (GOWN DISPOSABLE) ×4
IV NS IRRIG 3000ML ARTHROMATIC (IV SOLUTION) ×1 IMPLANT
KIT TURNOVER KIT A (KITS) ×2 IMPLANT
LABEL OR SOLS (LABEL) ×2 IMPLANT
MANIFOLD NEPTUNE II (INSTRUMENTS) ×2 IMPLANT
NDL FILTER BLUNT 18X1 1/2 (NEEDLE) ×1 IMPLANT
NDL HYPO 25X1 1.5 SAFETY (NEEDLE) ×1 IMPLANT
NEEDLE FILTER BLUNT 18X 1/2SAF (NEEDLE) ×1
NEEDLE FILTER BLUNT 18X1 1/2 (NEEDLE) ×1 IMPLANT
NEEDLE HYPO 25X1 1.5 SAFETY (NEEDLE) ×2 IMPLANT
NS IRRIG 500ML POUR BTL (IV SOLUTION) ×2 IMPLANT
PACK EXTREMITY ARMC (MISCELLANEOUS) ×2 IMPLANT
PAD ABD DERMACEA PRESS 5X9 (GAUZE/BANDAGES/DRESSINGS) ×3 IMPLANT
PULSAVAC PLUS IRRIG FAN TIP (DISPOSABLE)
SHIELD FULL FACE ANTIFOG 7M (MISCELLANEOUS) ×1 IMPLANT
STOCKINETTE M/LG 89821 (MISCELLANEOUS) ×2 IMPLANT
STRAP SAFETY 5IN WIDE (MISCELLANEOUS) ×2 IMPLANT
SUT ETHILON 3-0 FS-10 30 BLK (SUTURE) ×2
SUT ETHILON 5-0 FS-2 18 BLK (SUTURE) ×1 IMPLANT
SUT VIC AB 4-0 FS2 27 (SUTURE) ×1 IMPLANT
SUTURE EHLN 3-0 FS-10 30 BLK (SUTURE) ×1 IMPLANT
SYR 10ML LL (SYRINGE) ×6 IMPLANT
TIP FAN IRRIG PULSAVAC PLUS (DISPOSABLE) ×1 IMPLANT
WATER STERILE IRR 500ML POUR (IV SOLUTION) ×1 IMPLANT

## 2021-02-23 NOTE — Progress Notes (Signed)
PT Cancellation Note  Patient Details Name: Lucas Caldwell MRN: [id number] DOB: 07/03/1933   Cancelled Treatment:    Reason Eval/Treat Not Completed: Patient at procedure or test/unavailable Per chart review patient received R big toe amputation on 02/23/21. Will hold PT until 02/24/21.   Iva Boop, PT  02/23/21. 12:26 PM

## 2021-02-23 NOTE — Progress Notes (Signed)
Patient's O2 is at 76%. Patient is refusing oxygen.

## 2021-02-23 NOTE — Progress Notes (Signed)
Patient is acting very confused this morning. He pulled out his IV and verbally his words aren't making sense.

## 2021-02-23 NOTE — Anesthesia Preprocedure Evaluation (Signed)
Anesthesia Evaluation  Patient identified by MRN, date of birth, ID band Patient awake    Reviewed: Allergy & Precautions, H&P , NPO status , Patient's Chart, lab work & pertinent test results  History of Anesthesia Complications Negative for: history of anesthetic complications  Airway Mallampati: III  TM Distance: >3 FB Neck ROM: full    Dental  (+) Chipped, Poor Dentition, Missing, Dental Advidsory Given   Pulmonary shortness of breath and with exertion, sleep apnea , COPD, neg recent URI, former smoker,           Cardiovascular hypertension, (-) angina+ CAD, + Cardiac Stents and +CHF  (-) Past MI + dysrhythmias      Neuro/Psych  Headaches, negative psych ROS   GI/Hepatic Neg liver ROS, GERD  ,  Endo/Other  diabetes, Type 2Hypothyroidism   Renal/GU Renal disease  negative genitourinary   Musculoskeletal  (+) Arthritis ,   Abdominal   Peds  Hematology negative hematology ROS (+)   Anesthesia Other Findings Past Medical History: No date: Arthritis     Comment:  "probably in his legs" (08/15/2016) 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 No date: CAD (coronary artery disease)     Comment:  a. Cath in the late 90's - reportedly ok;  b. 2014 s/p               stenting x 2 @ [facility]; c 08/19/16 Cath/PCI with DES -> RCA,               plan to treat LM 70% medically. Seen by surgery and felt               to be too high risk for CABG. No date: Chronic combined systolic (congestive) and diastolic  (congestive) heart failure (HCC)     Comment:  a. Previously reduced EF-->50% by echo in 2012;  b.               06/2015 Echo: EF 50-55%  c. 07/2016 Echo: EF 45-50%. No date: Chronic kidney disease (CKD), stage IV (severe) (HCC) No date: CML (chronic myelocytic leukemia) (HCC) No date: GERD (gastroesophageal  reflux disease) No date: Gout No date: Hyperlipemia No date: Hypertension No date: Hypothyroidism No date: Iron deficiency anemia No date: Migraine     Comment:  "in the 76s" (08/15/2016) No date: NICM (nonischemic cardiomyopathy) ([place])     Comment:  a. Previously reduced EF-->50% by echo in 2012;  b.               06/2015 Echo: EF 50-55%, Gr2 DD, mild MR, mildly dil LA,               Ao sclerosis, mild TR;  c. 07/2016 Echo: EF 45-50%, ? inf               HK, mild MR, mildly dil LA. No date: PAF (paroxysmal atrial fibrillation) (HCC)     Comment:  a. ? Dx 2014-->s/p DCCV;  b. CHA2DS2VASc = 6-->chronic               coumadin. No date: Prostate cancer ([place]) No date: RBBB No date: Type II diabetes mellitus ([place])  Past Surgical History: No date: CATARACT EXTRACTION W/ INTRAOCULAR LENS  IMPLANT, BILATERAL;  Bilateral 09/2012: CORONARY ANGIOPLASTY WITH STENT PLACEMENT     Comment:  2 stents 08/19/2016: CORONARY  STENT INTERVENTION; N/A     Comment:  Procedure: Coronary Stent Intervention;  Surgeon: [name], MD;  Location: [place] CV LAB;  Service:              Cardiovascular;  Laterality: N/A; 12/16/2017: ESOPHAGEAL MANOMETRY; N/A     Comment:  Procedure: ESOPHAGEAL MANOMETRY (EM);  Surgeon: Lin Landsman, MD;  Location: [facility] ENDOSCOPY;  Service:               Gastroenterology;  Laterality: N/A; 12/20/2016: ESOPHAGOGASTRODUODENOSCOPY; N/A     Comment:  Procedure: ESOPHAGOGASTRODUODENOSCOPY (EGD);  Surgeon:               Lin Landsman, MD;  Location[facility] ENDOSCOPY;                Service: Gastroenterology;  Laterality: N/A; 11/03/2017: ESOPHAGOGASTRODUODENOSCOPY (EGD) WITH PROPOFOL; N/A     Comment:  Procedure: ESOPHAGOGASTRODUODENOSCOPY (EGD) WITH               PROPOFOL;  Surgeon: Lin Landsman, MD;  Location:               [facility] ENDOSCOPY;  Service: Gastroenterology;  Laterality:               N/A; No date: EYE SURGERY No date:  HERNIA REPAIR     Comment:  "navel" 08/14/2016: LEFT HEART CATH AND CORONARY ANGIOGRAPHY; N/A     Comment:  Procedure: Left Heart Cath and Coronary Angiography;                Surgeon: [name], MD;  Location: [place]               CV LAB;  Service: Cardiovascular;  Laterality: N/A; No date: TOE AMPUTATION; Right     Comment:  "big toe"  BMI    Body Mass Index:  33.13 kg/m      Reproductive/Obstetrics negative OB ROS                             Anesthesia Physical  Anesthesia Plan  ASA: III  Anesthesia Plan: General   Post-op Pain Management:    Induction: Intravenous  PONV Risk Score and Plan: Propofol infusion and TIVA  Airway Management Planned: Natural Airway and Nasal Cannula  Additional Equipment:   Intra-op Plan:   Post-operative Plan:   Informed Consent: I have reviewed the patients History and Physical, chart, labs and discussed the procedure including the risks, benefits and alternatives for the proposed anesthesia with the patient or authorized representative who has indicated his/her understanding and acceptance.     Dental Advisory Given  Plan Discussed with: Anesthesiologist, CRNA and Surgeon  Anesthesia Plan Comments: (Patient consented for risks of anesthesia including but not limited to:  - adverse reactions to medications - risk of intubation if required - damage to teeth, lips or other oral mucosa - sore throat or hoarseness - Damage to heart, brain, lungs or loss of life  Patient voiced understanding.)        Anesthesia Quick Evaluation

## 2021-02-23 NOTE — Op Note (Signed)
Operative note   Surgeon:Keelyn Monjaras Lawyer: None    Preop diagnosis: Osteomyelitis distal right great toe    Postop diagnosis: Same    Procedure: Amputation metatarsophalangeal joint right right toe    EBL: Minimal    Anesthesia:local and IV sedation.  Local consisted of a total of 10 cc of 0.5% bupivacaine    Hemostasis: None    Specimen: Amputated right great toe for pathology and deep wound culture    Complications: None    Operative indications:Lucas Caldwell is an 87 y.o. that presents today for surgical intervention.  The risks/benefits/alternatives/complications have been discussed and consent has been given.    Procedure:  Patient was brought into the OR and placed on the operating table in thesupine position. After anesthesia was obtained theright lower extremity was prepped and draped in usual sterile fashion.  Attention was directed to the right great toe where a fishmouth incision was made.  Dorsal and plantar skin flaps were performed.  The toe was then disarticulated at the metatarsophalangeal joint.  Proximal margins were marked and a deep wound culture was performed.  The wound was then flushed with copious amounts of irrigation.  All bleeders were Bovie cauterized as appropriate.  Closure was then performed with a 3-0 nylon.  Bulky sterile dressing was applied.    Patient tolerated the procedure and anesthesia well.  Was transported from the OR to the PACU with all vital signs stable and vascular status intact. To be discharged per routine protocol.

## 2021-02-23 NOTE — Progress Notes (Signed)
Patient extremely agitated and combative this evening.  MD notified; medication order received for PO Seroquel, which was ineffective.  MD informed and IM order received for Haldol 2mg  IM, medication given to patient.  Security was at bedside during each episode of combativeness.  Family also at bedside.  Report given to oncoming nurse.

## 2021-02-23 NOTE — H&P (Signed)
HISTORY AND PHYSICAL INTERVAL NOTE:  02/23/2021  7:25 AM  [name]  has presented today for surgery, with the diagnosis of RIGHT GREAT TOE AMPUTATION.  The various methods of treatment have been discussed with the patient.  No guarantees were given.  After consideration of risks, benefits and other options for treatment, the patient has consented to surgery.  I have reviewed the patients' chart and labs.     A history and physical examination was performed in my office.  The patient was reexamined.  There have been no changes to this history and physical examination.  Samara Deist A

## 2021-02-23 NOTE — Progress Notes (Addendum)
PROGRESS NOTE  Lucas Caldwell    DOB: 10-11-1933, 87 y.o.  YDX:[id number]  PCP: [name], DO   Code Status: Full Code   DOA: 02/21/2021   LOS: 2  Brief Narrative of Current Hospitalization  Lucas Caldwell is a 87 y.o. male with a PMH significant for OA, right eye blindness, BPH, HTN, HLD, hypothyroidism, IDA, OSA, migraines, PAF, orthostatic hypotension, history of GI bleed from duodenal ulcer, RBBB, type II DM, GERD, HFrEF EF 30 to 35%. They presented from home to the ED on 02/21/2021 with redness and pain to right great toe x 5 days.  He was seen by his podiatrist and they recommended presentation to ED.  In the ED, it was found that they had R great toe foot ulcer with ascending erythema. They were treated with IV antibiotics and general surgery was consulted.  Patient was admitted to medicine service for further workup and management of diabetic foot ulcer as outlined in detail below.  02/23/21 -stable  Assessment & Plan  Principal Problem:   Diabetic foot ulcer ([place]) Active Problems:   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 heart with stable angina pectoris (HCC)   Essential hypertension   Paroxysmal atrial fibrillation (HCC)   Ulcerated, foot, right, with necrosis of bone ([place])   Controlled type 2 diabetes mellitus with diabetic nephropathy, with long-term current use of insulin (HCC)   Centrilobular emphysema (HCC)   CKD (chronic kidney disease), stage IV ([place])   Obstructive sleep apnea  Diabetic foot ulcer right great toe-patient has minimal pain. ABI completed, MRI R foot showed osteomyelitis in proximal phalanx.  - vascular/podiatry consulted- corrective surgery today - continue IV Abx until surgical pathology returns -Analgesia as needed  CAD  HFrEF  HTN  Afib-chronic, stable, asymptomatic.  Rate controlled. - continue home medications - consulted pharmacy to discuss if patient can be changed from warfarin to [place] following surgery. No  contraindications to DOAC but cost may be a barrier. Looks like patient portion would be $45/month for either eliquis or xarelto.  Type II DM-hemoglobin A1c was 6.2 recently which is well below goal of 8 for his age.  Home meds include 18 units long-acting daily, Jardiance.  -Continue home medications and discontinue sliding scale  CKDIII- Cr appears to be at or better than baseline this admission  DVT prophylaxis: heparin injection 5,000 Units Start: 02/21/21 2200 SCDs Start: 02/21/21 1933   Diet:  Diet Orders (From admission, onward)     Start     Ordered   02/23/21 0430  Diet NPO time specified  Diet effective ____        02/22/21 1252            Subjective 02/23/21    Pt reports surgery went well.  Disposition Plan & Communication  Patient status: Inpatient  Admitted From: Home Disposition: Home Anticipated discharge date: TBD  Family Communication: wife at bedside  Consults, Procedures, Significant Events  Consultants:  Vascular surgery Podiatry   Procedures/significant events:  Amputation right great toe 12/9. Antimicrobials:  Anti-infectives (From admission, onward)    Start     Dose/Rate Route Frequency Ordered Stop   02/23/21 2000  vancomycin (VANCOREADY) IVPB 1250 mg/250 mL  Status:  Discontinued        1,250 mg 166.7 mL/hr over 90 Minutes Intravenous Every 48 hours 02/21/21 2040 02/22/21 1006   02/23/21 2000  [MAR Hold]  vancomycin (VANCOREADY) IVPB 1500 mg/300 mL        (  MAR Hold since Fri 02/23/2021 at 0633.Hold Reason: Transfer to a Procedural area)   1,500 mg 150 mL/hr over 120 Minutes Intravenous Every 48 hours 02/22/21 1006     02/22/21 0600  [MAR Hold]  piperacillin-tazobactam (ZOSYN) IVPB 3.375 g        (MAR Hold since Fri 02/23/2021 at 0633.Hold Reason: Transfer to a Procedural area)   3.375 g 12.5 mL/hr over 240 Minutes Intravenous Every 8 hours 02/21/21 1932     02/21/21 1945  vancomycin (VANCOCIN) IVPB 1000 mg/200 mL premix        1,000  mg 200 mL/hr over 60 Minutes Intravenous  Once 02/21/21 1943 02/21/21 2153   02/21/21 1715  vancomycin (VANCOCIN) IVPB 1000 mg/200 mL premix        1,000 mg 200 mL/hr over 60 Minutes Intravenous  Once 02/21/21 1710 02/21/21 1939   02/21/21 1715  piperacillin-tazobactam (ZOSYN) IVPB 3.375 g        3.375 g 100 mL/hr over 30 Minutes Intravenous  Once 02/21/21 1710 02/21/21 1832       Objective   Vitals:   02/22/21 1950 02/23/21 0449 02/23/21 0450 02/23/21 0646  BP: (!) 126/55 117/61 120/69 (!) 141/83  Pulse: 81 72 73 82  Resp: 17 16 16 18   Temp: 98.1 F (36.7 C) 97.7 F (36.5 C) 97.7 F (36.5 C) 98.2 F (36.8 C)  TempSrc:    Oral  SpO2: 97% 100% 100% 99%  Weight:    89.4 kg  Height:    5\' 3"  (1.6 m)    Intake/Output Summary (Last 24 hours) at 02/23/2021 0719 Last data filed at 02/23/2021 0022 Gross per 24 hour  Intake 55.69 ml  Output 1175 ml  Net -1119.31 ml   Filed Weights   02/21/21 1124 02/23/21 0646  Weight: 89.4 kg 89.4 kg    Patient BMI: Body mass index is 34.91 kg/m.   Physical Exam: General: awake, alert, NAD Respiratory: normal respiratory effort. Cardiovascular: quick capillary refill Ues. Decreased pedal pulses, Les warm. RRR.  Nervous: A&O x3. no gross focal neurologic deficits, normal speech Extremities: negative edema Skin: dry, intact, normal temperature, normal color. Surgical wound in bandage. Psychiatry: normal mood, congruent affect  Labs   I have personally reviewed following labs and imaging studies Admission on 02/21/2021  Component Date Value Ref Range Status   Lactic Acid, Venous 02/21/2021 1.6  0.5 - 1.9 mmol/L Final   Lactic Acid, Venous 02/21/2021 1.2  0.5 - 1.9 mmol/L Final   Sodium 02/21/2021 134 (L)  135 - 145 mmol/L Final   Potassium 02/21/2021 3.9  3.5 - 5.1 mmol/L Final   Chloride 02/21/2021 101  98 - 111 mmol/L Final   CO2 02/21/2021 23  22 - 32 mmol/L Final   Glucose, Bld 02/21/2021 212 (H)  70 - 99 mg/dL Final   BUN  02/21/2021 39 (H)  8 - 23 mg/dL Final   Creatinine, Ser 02/21/2021 1.83 (H)  0.61 - 1.24 mg/dL Final   Calcium 02/21/2021 8.8 (L)  8.9 - 10.3 mg/dL Final   Total Protein 02/21/2021 8.0  6.5 - 8.1 g/dL Final   Albumin 02/21/2021 3.4 (L)  3.5 - 5.0 g/dL Final   AST 02/21/2021 22  15 - 41 U/L Final   ALT 02/21/2021 11  0 - 44 U/L Final   Alkaline Phosphatase 02/21/2021 60  38 - 126 U/L Final   Total Bilirubin 02/21/2021 0.9  0.3 - 1.2 mg/dL Final   GFR, Estimated 02/21/2021 36 (L)  >  60 mL/min Final   Anion gap 02/21/2021 10  5 - 15 Final   WBC 02/21/2021 10.5  4.0 - 10.5 K/uL Final   RBC 02/21/2021 4.60  4.22 - 5.81 MIL/uL Final   Hemoglobin 02/21/2021 12.8 (L)  13.0 - 17.0 g/dL Final   HCT 02/21/2021 39.3  39.0 - 52.0 % Final   MCV 02/21/2021 85.4  80.0 - 100.0 fL Final   MCH 02/21/2021 27.8  26.0 - 34.0 pg Final   MCHC 02/21/2021 32.6  30.0 - 36.0 g/dL Final   RDW 02/21/2021 15.6 (H)  11.5 - 15.5 % Final   Platelets 02/21/2021 164  150 - 400 K/uL Final   nRBC 02/21/2021 0.0  0.0 - 0.2 % Final   Neutrophils Relative % 02/21/2021 78  % Final   Neutro Abs 02/21/2021 8.1 (H)  1.7 - 7.7 K/uL Final   Lymphocytes Relative 02/21/2021 12  % Final   Lymphs Abs 02/21/2021 1.2  0.7 - 4.0 K/uL Final   Monocytes Relative 02/21/2021 9  % Final   Monocytes Absolute 02/21/2021 1.0  0.1 - 1.0 K/uL Final   Eosinophils Relative 02/21/2021 1  % Final   Eosinophils Absolute 02/21/2021 0.1  0.0 - 0.5 K/uL Final   Basophils Relative 02/21/2021 0  % Final   Basophils Absolute 02/21/2021 0.0  0.0 - 0.1 K/uL Final   Immature Granulocytes 02/21/2021 0  % Final   Abs Immature Granulocytes 02/21/2021 0.03  0.00 - 0.07 K/uL Final   Color, Urine 02/21/2021 YELLOW  YELLOW Final   APPearance 02/21/2021 CLEAR (A)  CLEAR Final   Specific Gravity, Urine 02/21/2021 1.015  1.005 - 1.030 Final   pH 02/21/2021 5.5  5.0 - 8.0 Final   Glucose, UA 02/21/2021 >1,000 (A)  NEGATIVE mg/dL Final   Hgb urine dipstick 02/21/2021  NEGATIVE  NEGATIVE Final   Bilirubin Urine 02/21/2021 NEGATIVE  NEGATIVE Final   Ketones, ur 02/21/2021 NEGATIVE  NEGATIVE mg/dL Final   Protein, ur 02/21/2021 NEGATIVE  NEGATIVE mg/dL Final   Nitrite 02/21/2021 NEGATIVE  NEGATIVE Final   Leukocytes,Ua 02/21/2021 NEGATIVE  NEGATIVE Final   WBC, UA 02/21/2021 NONE SEEN  0 - 5 WBC/hpf Final   Bacteria, UA 02/21/2021 NONE SEEN  NONE SEEN Final   Squamous Epithelial / LPF 02/21/2021 0-5  0 - 5 Final   Mucus 02/21/2021 PRESENT   Final   Hyaline Casts, UA 02/21/2021 PRESENT   Final   Specimen Description 02/21/2021 BLOOD BRH   Final   Special Requests 02/21/2021 BOTTLES DRAWN AEROBIC AND ANAEROBIC BCLV   Final   Culture  Setup Time 02/21/2021    Final                   Value:GRAM POSITIVE COCCI AEROBIC BOTTLE ONLY Organism ID to follow CRITICAL RESULT CALLED TO, READ BACK BY AND VERIFIED WITH: SHEEMA HALLAJI 02/22/21 1201 MW GRAM VARIABLE COCCI ANAEROBIC BOTTLE ONLY CRITICAL RESULT CALLED TO, READ BACK BY AND VERIFIED WITH: CARISSA [facility] 02/22/21 1700 KLW Performed at [facility], [address] [employer], [place] [address]    Culture 02/21/2021    Final                   Value:GRAM POSITIVE COCCI GRAM VARIABLE COCCI    Report Status 02/21/2021 PENDING   Incomplete   Specimen Description 02/21/2021 BLOOD LAC   Final   Special Requests 02/21/2021 BOTTLES DRAWN AEROBIC AND ANAEROBIC BCLV   Final   Culture 02/21/2021  Final                   Value:NO GROWTH 2 DAYS Performed at [facility], [address] [place], [place] [address]    Report Status 02/21/2021 PENDING   Incomplete   SARS Coronavirus 2 by RT PCR 02/21/2021 NEGATIVE  NEGATIVE Final   Influenza A by PCR 02/21/2021 NEGATIVE  NEGATIVE Final   Influenza B by PCR 02/21/2021 NEGATIVE  NEGATIVE Final   WBC 02/21/2021 11.1 (H)  4.0 - 10.5 K/uL Final   RBC 02/21/2021 4.43  4.22 - 5.81 MIL/uL Final   Hemoglobin 02/21/2021 12.1 (L)  13.0 - 17.0 g/dL Final   HCT  02/21/2021 38.2 (L)  39.0 - 52.0 % Final   MCV 02/21/2021 86.2  80.0 - 100.0 fL Final   MCH 02/21/2021 27.3  26.0 - 34.0 pg Final   MCHC 02/21/2021 31.7  30.0 - 36.0 g/dL Final   RDW 02/21/2021 15.4  11.5 - 15.5 % Final   Platelets 02/21/2021 167  150 - 400 K/uL Final   nRBC 02/21/2021 0.0  0.0 - 0.2 % Final   Creatinine, Ser 02/21/2021 1.50 (H)  0.61 - 1.24 mg/dL Final   GFR, Estimated 02/21/2021 45 (L)  >60 mL/min Final   Hgb A1c MFr Bld 02/21/2021 7.3 (H)  4.8 - 5.6 % Final   Mean Plasma Glucose 02/21/2021 163  mg/dL Final   Sodium 02/22/2021 133 (L)  135 - 145 mmol/L Final   Potassium 02/22/2021 3.8  3.5 - 5.1 mmol/L Final   Chloride 02/22/2021 101  98 - 111 mmol/L Final   CO2 02/22/2021 24  22 - 32 mmol/L Final   Glucose, Bld 02/22/2021 83  70 - 99 mg/dL Final   BUN 02/22/2021 28 (H)  8 - 23 mg/dL Final   Creatinine, Ser 02/22/2021 1.32 (H)  0.61 - 1.24 mg/dL Final   Calcium 02/22/2021 8.5 (L)  8.9 - 10.3 mg/dL Final   Total Protein 02/22/2021 7.9  6.5 - 8.1 g/dL Final   Albumin 02/22/2021 3.2 (L)  3.5 - 5.0 g/dL Final   AST 02/22/2021 18  15 - 41 U/L Final   ALT 02/22/2021 10  0 - 44 U/L Final   Alkaline Phosphatase 02/22/2021 57  38 - 126 U/L Final   Total Bilirubin 02/22/2021 1.1  0.3 - 1.2 mg/dL Final   GFR, Estimated 02/22/2021 53 (L)  >60 mL/min Final   Anion gap 02/22/2021 8  5 - 15 Final   WBC 02/22/2021 11.6 (H)  4.0 - 10.5 K/uL Final   RBC 02/22/2021 4.47  4.22 - 5.81 MIL/uL Final   Hemoglobin 02/22/2021 12.3 (L)  13.0 - 17.0 g/dL Final   HCT 02/22/2021 38.7 (L)  39.0 - 52.0 % Final   MCV 02/22/2021 86.6  80.0 - 100.0 fL Final   MCH 02/22/2021 27.5  26.0 - 34.0 pg Final   MCHC 02/22/2021 31.8  30.0 - 36.0 g/dL Final   RDW 02/22/2021 15.3  11.5 - 15.5 % Final   Platelets 02/22/2021 181  150 - 400 K/uL Final   nRBC 02/22/2021 0.0  0.0 - 0.2 % Final   Prothrombin Time 02/21/2021 19.3 (H)  11.4 - 15.2 seconds Final   INR 02/21/2021 1.6 (H)  0.8 - 1.2 Final    Prothrombin Time 02/22/2021 19.6 (H)  11.4 - 15.2 seconds Final   INR 02/22/2021 1.7 (H)  0.8 - 1.2 Final   Glucose-Capillary 02/21/2021 132 (H)  70 -  99 mg/dL Final   Glucose-Capillary 02/22/2021 94  70 - 99 mg/dL Final   Enterococcus faecalis 02/21/2021 NOT DETECTED  NOT DETECTED Final   Enterococcus Faecium 02/21/2021 NOT DETECTED  NOT DETECTED Final   Listeria monocytogenes 02/21/2021 NOT DETECTED  NOT DETECTED Final   Staphylococcus species 02/21/2021 DETECTED (A)  NOT DETECTED Final   Staphylococcus aureus (BCID) 02/21/2021 NOT DETECTED  NOT DETECTED Final   Staphylococcus epidermidis 02/21/2021 NOT DETECTED  NOT DETECTED Final   Staphylococcus lugdunensis 02/21/2021 NOT DETECTED  NOT DETECTED Final   Streptococcus species 02/21/2021 NOT DETECTED  NOT DETECTED Final   Streptococcus agalactiae 02/21/2021 NOT DETECTED  NOT DETECTED Final   Streptococcus pneumoniae 02/21/2021 NOT DETECTED  NOT DETECTED Final   Streptococcus pyogenes 02/21/2021 NOT DETECTED  NOT DETECTED Final   A.calcoaceticus-baumannii 02/21/2021 NOT DETECTED  NOT DETECTED Final   Bacteroides fragilis 02/21/2021 NOT DETECTED  NOT DETECTED Final   Enterobacterales 02/21/2021 NOT DETECTED  NOT DETECTED Final   Enterobacter cloacae complex 02/21/2021 NOT DETECTED  NOT DETECTED Final   Escherichia coli 02/21/2021 NOT DETECTED  NOT DETECTED Final   Klebsiella aerogenes 02/21/2021 NOT DETECTED  NOT DETECTED Final   Klebsiella oxytoca 02/21/2021 NOT DETECTED  NOT DETECTED Final   Klebsiella pneumoniae 02/21/2021 NOT DETECTED  NOT DETECTED Final   Proteus species 02/21/2021 NOT DETECTED  NOT DETECTED Final   Salmonella species 02/21/2021 NOT DETECTED  NOT DETECTED Final   Serratia marcescens 02/21/2021 NOT DETECTED  NOT DETECTED Final   Haemophilus influenzae 02/21/2021 NOT DETECTED  NOT DETECTED Final   Neisseria meningitidis 02/21/2021 NOT DETECTED  NOT DETECTED Final   Pseudomonas aeruginosa 02/21/2021 NOT DETECTED  NOT  DETECTED Final   Stenotrophomonas maltophilia 02/21/2021 NOT DETECTED  NOT DETECTED Final   Candida albicans 02/21/2021 NOT DETECTED  NOT DETECTED Final   Candida auris 02/21/2021 NOT DETECTED  NOT DETECTED Final   Candida glabrata 02/21/2021 NOT DETECTED  NOT DETECTED Final   Candida krusei 02/21/2021 NOT DETECTED  NOT DETECTED Final   Candida parapsilosis 02/21/2021 NOT DETECTED  NOT DETECTED Final   Candida tropicalis 02/21/2021 NOT DETECTED  NOT DETECTED Final   Cryptococcus neoformans/gattii 02/21/2021 NOT DETECTED  NOT DETECTED Final   Glucose-Capillary 02/22/2021 132 (H)  70 - 99 mg/dL Final   Enterococcus faecalis 02/21/2021 NOT DETECTED  NOT DETECTED Final   Enterococcus Faecium 02/21/2021 NOT DETECTED  NOT DETECTED Final   Listeria monocytogenes 02/21/2021 NOT DETECTED  NOT DETECTED Final   Staphylococcus species 02/21/2021 NOT DETECTED  NOT DETECTED Final   Staphylococcus aureus (BCID) 02/21/2021 NOT DETECTED  NOT DETECTED Final   Staphylococcus epidermidis 02/21/2021 NOT DETECTED  NOT DETECTED Final   Staphylococcus lugdunensis 02/21/2021 NOT DETECTED  NOT DETECTED Final   Streptococcus species 02/21/2021 NOT DETECTED  NOT DETECTED Final   Streptococcus agalactiae 02/21/2021 NOT DETECTED  NOT DETECTED Final   Streptococcus pneumoniae 02/21/2021 NOT DETECTED  NOT DETECTED Final   Streptococcus pyogenes 02/21/2021 NOT DETECTED  NOT DETECTED Final   A.calcoaceticus-baumannii 02/21/2021 NOT DETECTED  NOT DETECTED Final   Bacteroides fragilis 02/21/2021 NOT DETECTED  NOT DETECTED Final   Enterobacterales 02/21/2021 NOT DETECTED  NOT DETECTED Final   Enterobacter cloacae complex 02/21/2021 NOT DETECTED  NOT DETECTED Final   Escherichia coli 02/21/2021 NOT DETECTED  NOT DETECTED Final   Klebsiella aerogenes 02/21/2021 NOT DETECTED  NOT DETECTED Final   Klebsiella oxytoca 02/21/2021 NOT DETECTED  NOT DETECTED Final   Klebsiella pneumoniae 02/21/2021 NOT DETECTED  NOT DETECTED Final  Proteus species 02/21/2021 NOT DETECTED  NOT DETECTED Final   Salmonella species 02/21/2021 NOT DETECTED  NOT DETECTED Final   Serratia marcescens 02/21/2021 NOT DETECTED  NOT DETECTED Final   Haemophilus influenzae 02/21/2021 NOT DETECTED  NOT DETECTED Final   Neisseria meningitidis 02/21/2021 NOT DETECTED  NOT DETECTED Final   Pseudomonas aeruginosa 02/21/2021 NOT DETECTED  NOT DETECTED Final   Stenotrophomonas maltophilia 02/21/2021 NOT DETECTED  NOT DETECTED Final   Candida albicans 02/21/2021 NOT DETECTED  NOT DETECTED Final   Candida auris 02/21/2021 NOT DETECTED  NOT DETECTED Final   Candida glabrata 02/21/2021 NOT DETECTED  NOT DETECTED Final   Candida krusei 02/21/2021 NOT DETECTED  NOT DETECTED Final   Candida parapsilosis 02/21/2021 NOT DETECTED  NOT DETECTED Final   Candida tropicalis 02/21/2021 NOT DETECTED  NOT DETECTED Final   Cryptococcus neoformans/gattii 02/21/2021 NOT DETECTED  NOT DETECTED Final   Prothrombin Time 02/23/2021 20.3 (H)  11.4 - 15.2 seconds Final   INR 02/23/2021 1.7 (H)  0.8 - 1.2 Final   Sodium 02/23/2021 135  135 - 145 mmol/L Final   Potassium 02/23/2021 4.0  3.5 - 5.1 mmol/L Final   Chloride 02/23/2021 106  98 - 111 mmol/L Final   CO2 02/23/2021 22  22 - 32 mmol/L Final   Glucose, Bld 02/23/2021 90  70 - 99 mg/dL Final   BUN 02/23/2021 19  8 - 23 mg/dL Final   Creatinine, Ser 02/23/2021 1.23  0.61 - 1.24 mg/dL Final   Calcium 02/23/2021 8.6 (L)  8.9 - 10.3 mg/dL Final   GFR, Estimated 02/23/2021 58 (L)  >60 mL/min Final   Anion gap 02/23/2021 7  5 - 15 Final   Glucose-Capillary 02/22/2021 115 (H)  70 - 99 mg/dL Final   Glucose-Capillary 02/23/2021 78  70 - 99 mg/dL Final    Imaging Studies  MR FOOT RIGHT WO CONTRAST  Result Date: 02/22/2021 CLINICAL DATA:  Diabetic foot swelling. EXAM: MRI OF THE RIGHT FOREFOOT WITHOUT CONTRAST TECHNIQUE: Multiplanar, multisequence MR imaging of the right forefoot was performed. No intravenous contrast was  administered. COMPARISON:  Radiographs 02/21/2021 FINDINGS: Despite efforts by the technologist and patient, motion artifact is present on today's exam and could not be eliminated. This reduces exam sensitivity and specificity. Bones/Joint/Cartilage Prior amputation of the great toe at the interphalangeal joint. Mild but abnormal edema distally in the proximal phalanx great toe for example on image 13 series 7 suspicious for osteomyelitis. Ligaments The Lisfranc ligament appears grossly intact. Muscles and Tendons Generalized atrophy of the regional musculature of the foot. Soft tissues There is abnormal edema in the great toe with effacement of the normal fatty signal along the distal medial plantar margin of the amputation site for example on images 12 through 15 of series 7, suspicious for localized cellulitis adjacent to the region of suspected osteomyelitis. Dorsal subcutaneous edema in the forefoot is nonspecific, but cellulitis is not excluded. IMPRESSION: 1. Suspected osteomyelitis distally in the proximal phalanx great toe. The distal phalanx of the great toe has been previously amputated. There is effacement of normal fatty signal in the remaining great toe distally along the plantar-medial margin adjacent to the region of marrow edema in the proximal phalanx. 2. Dorsal subcutaneous edema along the forefoot could be sterile or could reflect cellulitis. Electronically Signed   By: Van Clines M.D.   On: 02/22/2021 21:27   US ARTERIAL ABI (SCREENING LOWER EXTREMITY)  Result Date: 02/22/2021 CLINICAL DATA:  87 year old male with a history of cardiac  disease EXAM: NONINVASIVE PHYSIOLOGIC VASCULAR STUDY OF BILATERAL LOWER EXTREMITIES TECHNIQUE: Evaluation of both lower extremities was performed at rest, including calculation of ankle-brachial indices, multiple segmental pressure evaluation, segmental Doppler and segmental pulse volume recording. COMPARISON:  None. 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 Segmental Doppler at the right ankle demonstrates evidence of more proximal occlusive disease. Left: Resting ABI noncompressible Segmental Doppler at the left ankle demonstrates evidence of more proximal occlusive disease Signed, Dulcy Fanny. Dellia Nims, RPVI [facility] Electronically Signed   By: Corrie Mckusick D.O.   On: 02/22/2021 08:56   Medications   Scheduled Meds:  [MAR Hold] allopurinol  100 mg Oral Daily   [MAR Hold] aspirin  81 mg Oral Daily   [MAR Hold] bosutinib  100 mg Oral Q breakfast   chlorhexidine  60 mL Topical Once   [MAR Hold] empagliflozin  10 mg Oral Q breakfast   [MAR Hold] heparin  5,000 Units Subcutaneous Q8H   [MAR Hold] insulin glargine-yfgn  18 Units Subcutaneous Daily   [MAR Hold] isosorbide mononitrate  30 mg Oral Daily   [MAR Hold] levothyroxine  25 mcg Oral Q breakfast   [MAR Hold] losartan  25 mg Oral Daily   [MAR Hold] ranolazine  500 mg Oral BID   [MAR Hold] rosuvastatin  5 mg Oral Daily   [MAR Hold] tamsulosin  0.4 mg Oral QPC supper   [MAR Hold] traMADol  50-100 mg Oral Q12H   No recently discontinued medications to reconcile  LOS: 2 days   Time spent: >56min  Johnryan Sao L Caidan Hubbert, DO [facility] 02/23/2021, 7:19 AM   Please refer to amion to contact the [facility] Attending or Consulting provider for this pt  [email or web address] Available by Epic secure chat 7AM-7PM. If 7PM-7AM, please contact night-coverage

## 2021-02-23 NOTE — Progress Notes (Signed)
Cross Cover Patient remains agitated and aggressive after receiving 25 mg seroquel and 2mg  haldol. Agitation continuing to require security personnel for monitoring patient  Last EKG with PZX 806 Not currently on cardiac monitor Ativan 1 mg for now Apply cardiac monitor if able (heart failure with EF 30-35%)

## 2021-02-23 NOTE — Anesthesia Procedure Notes (Signed)
Procedure Name: General with mask airway Date/Time: 02/23/2021 8:16 AM Performed by: Kelton Pillar, CRNA Pre-anesthesia Checklist: Patient identified, Emergency Drugs available, Suction available and Patient being monitored Patient Re-evaluated:Patient Re-evaluated prior to induction Oxygen Delivery Method: Circle system utilized Induction Type: IV induction Placement Confirmation: positive ETCO2 and CO2 detector Dental Injury: Teeth and Oropharynx as per pre-operative assessment

## 2021-02-23 NOTE — Evaluation (Signed)
Physical Therapy Evaluation Patient Details Name: Lucas Caldwell MRN: [id number] DOB: Feb 02, 1934 Today's Date: 02/23/2021  History of Present Illness  Patient is a 87 y.o. male with a PMH significant for OA, right eye blindness, BPH, HTN, HLD, hypothyroidism, IDA, OSA, migraines, PAF, orthostatic hypotension, history of GI bleed from duodenal ulcer, RBBB, type II DM, GERD. Patient recieved a great right toe amputation on 02/23/21 due to osteomyelitis.  Clinical Impression  Patient tolerated session fairly. Upon arrival patient was sitting in chair with wife present in room. No pain reported throughout session. Patient was cooperative initially for ROM/strength and sensation testing. Globally BLE strength is at least 3/5. ROM WNL. Sensation intact. When attempting to place gait belt on patient, patient started to become agitated, and refused therapy. Patient continued to refuse therapy with continuous encouragement. When telling patient PT was leaving the room, RN present in room, patient attempted to stand. PT cued patient th sit down and asked if he would walk with PT present in room. Patient refused. Patient would continue to benefit from skilled physical therapy to optimize patient return to PLOF. Will continue to assess in future sessions as patient allows.       Recommendations for follow up therapy are one component of a multi-disciplinary discharge planning process, led by the attending physician.  Recommendations may be updated based on patient status, additional functional criteria and insurance authorization.  Follow Up Recommendations Home health PT (pending additional mobility assessment)    Assistance Recommended at Discharge Intermittent Supervision/Assistance  Functional Status Assessment Patient has had a recent decline in their functional status and demonstrates the ability to make significant improvements in function in a reasonable and predictable amount of time.  Equipment  Recommendations  None recommended by PT    Recommendations for Other Services       Precautions / Restrictions Precautions Precautions: Fall Precaution Comments: R post op shoe Restrictions Weight Bearing Restrictions: Yes RLE Weight Bearing: Weight bearing as tolerated      Mobility  Bed Mobility                 Patient Response:  (Agitated)  Transfers                        Ambulation/Gait Ambulation/Gait assistance:  (pt refused when PT attempted to place gait belt on patient.Continued refusal despite contiuous encouragement. When therapist walked away patient attemptd to stand.When therapist rushed back over patient refused to stand with gait belt on/ at all w/ PT)                Stairs            Wheelchair Mobility    Modified Rankin (Stroke Patients Only)       Balance Overall balance assessment: Modified Independent Sitting-balance support: Feet supported;No upper extremity supported Sitting balance-Leahy Scale: Good   Postural control: Posterior lean Standing balance support:  (unable to assess due to patient refusal)                                 Pertinent Vitals/Pain Pain Assessment: 0-10 Pain Score: 0-No pain    Home Living Family/patient expects to be discharged to:: Private residence Living Arrangements: Spouse/significant other Available Help at Discharge: Family Type of Home: House Home Access: Stairs to enter Entrance Stairs-Rails: Right;Left;Can reach both Entrance Stairs-Number of Steps: 4   Home Layout: One level  Home Equipment: Cane - single Barista (2 wheels)      Prior Function Prior Level of Function : Independent/Modified Independent                     Hand Dominance   Dominant Hand: Right    Extremity/Trunk Assessment        Lower Extremity Assessment Lower Extremity Assessment: Generalized weakness (grossly at least 3/5 strenght, sensation intact  BLE)       Communication   Communication: No difficulties  Cognition                                       General Comments: A&O to name, situation, month        General Comments      Exercises General Exercises - Lower Extremity Short Arc Quad: Seated;AROM;Right;Left;10 reps Hip Flexion/Marching: Seated;AROM;Right;Left;20 reps Other Exercises Other Exercises: Patient educated on weight-bearing precautions   Assessment/Plan    PT Assessment Patient needs continued PT services  PT Problem List Decreased strength;Decreased mobility;Decreased coordination;Decreased safety awareness;Decreased knowledge of precautions       PT Treatment Interventions DME instruction;Therapeutic exercise;Balance training;Neuromuscular re-education;Functional mobility training;Therapeutic activities;Patient/family education;Stair training;Gait training    PT Goals (Current goals can be found in the Care Plan section)  Acute Rehab PT Goals Patient Stated Goal: to go home PT Goal Formulation: With patient Time For Goal Achievement: 03/09/21 Potential to Achieve Goals: Good    Frequency 7X/week   Barriers to discharge        Co-evaluation               AM-PAC PT "6 Clicks" Mobility  Outcome Measure Help needed turning from your back to your side while in a flat bed without using bedrails?: None Help needed moving from lying on your back to sitting on the side of a flat bed without using bedrails?: None Help needed moving to and from a bed to a chair (including a wheelchair)?: A Little Help needed standing up from a chair using your arms (e.g., wheelchair or bedside chair)?: A Little Help needed to walk in hospital room?: A Little Help needed climbing 3-5 steps with a railing? : A Little 6 Click Score: 20    End of Session Equipment Utilized During Treatment: Gait belt Activity Tolerance: No increased pain;Treatment limited secondary to agitation Patient left: in  chair;with call bell/phone within reach;with family/visitor present Nurse Communication: Mobility status;Other (comment) (Participation level) PT Visit Diagnosis: Difficulty in walking, not elsewhere classified (R26.2);Muscle weakness (generalized) (M62.81);Other abnormalities of gait and mobility (R26.89);Unsteadiness on feet (R26.81)    Time: 3419-6222 PT Time Calculation (min) (ACUTE ONLY): 14 min   Charges:   PT Evaluation $PT Eval Low Complexity: 1 Low         Iva Boop, PT  02/23/21. 4:45 PM

## 2021-02-23 NOTE — Consult Note (Signed)
Pharmacy Antibiotic Note  Lucas Caldwell is a 87 y.o. male admitted on 02/21/2021 with cellulitis.  Pharmacy has been consulted for Vancomycin dosing. Patient is also receiving Zosyn.  -12/9 for toe amputation  Plan: Continue Zosyn 3.375 g Q8H  Vancomycin 1500 mg Q48H.  Goal AUC 400-550. Expected AUC: 528 SCr used: 1.32   Height: 5\' 3"  (160 cm) Weight: 89.4 kg (197 lb 1.5 oz) IBW/kg (Calculated) : 56.9  Temp (24hrs), Avg:97.9 F (36.6 C), Min:97.3 F (36.3 C), Max:98.4 F (36.9 C)  Recent Labs  Lab 02/21/21 1127 02/21/21 1800 02/21/21 1944 02/22/21 0500 02/23/21 0118  WBC 10.5  --  11.1* 11.6*  --   CREATININE 1.83*  --  1.50* 1.32* 1.23  LATICACIDVEN 1.6 1.2  --   --   --      Estimated Creatinine Clearance: 43.4 mL/min (by C-G formula based on SCr of 1.23 mg/dL).    Allergies  Allergen Reactions   Clopidogrel Anaphylaxis and Other (See Comments)    altered mental status;  (electrolytes "out of wack" and confusion per family; THEY DO NOT REMEMBER ANY OTHER REACTION)- MSB 10/10/15   Ciprofloxacin Itching   Beta Adrenergic Blockers     Nightmares   Mirtazapine Diarrhea    Bad dreams    Spironolactone Other (See Comments)    gynecomastia    Antimicrobials this admission: Vancomycin 12/7 >>  Zosyn 12/7 >>   Microbiology results: 12/7 BCx: staph homninis 12/7 Wcx: NG<24 hr  Thank you for allowing pharmacy to be a part of this patient's care.  Noralee Space, PharmD Clinical Pharmacist   02/23/2021 1:18 PM

## 2021-02-23 NOTE — Anesthesia Postprocedure Evaluation (Signed)
Anesthesia Post Note  Patient: Lucas Caldwell  Procedure(s) Performed: AMPUTATION TOE (Right: Toe)  Patient location during evaluation: PACU Anesthesia Type: General Level of consciousness: awake and alert Pain management: pain level controlled Vital Signs Assessment: post-procedure vital signs reviewed and stable Respiratory status: spontaneous breathing, nonlabored ventilation, respiratory function stable and patient connected to nasal cannula oxygen Cardiovascular status: blood pressure returned to baseline and stable Postop Assessment: no apparent nausea or vomiting Anesthetic complications: no   No notable events documented.   Last Vitals:  Vitals:   02/23/21 0850 02/23/21 1127  BP:  (!) 146/92  Pulse:  83  Resp:  16  Temp:  36.9 C  SpO2: 100% 94%    Last Pain:  Vitals:   02/23/21 0849  TempSrc: Oral  PainSc: 0-No pain                 Martha Clan

## 2021-02-23 NOTE — Transfer of Care (Signed)
Immediate Anesthesia Transfer of Care Note  Patient: Lucas Caldwell  Procedure(s) Performed: AMPUTATION TOE (Right: Toe)  Patient Location: PACU  Anesthesia Type:General  Level of Consciousness: awake, drowsy and patient cooperative  Airway & Oxygen Therapy: Patient Spontanous Breathing  Post-op Assessment: Report given to RN and Post -op Vital signs reviewed and stable  Post vital signs: Reviewed and stable  Last Vitals:  Vitals Value Taken Time  BP 114/78 02/23/21 0812  Temp    Pulse 106 02/23/21 0815  Resp 20 02/23/21 0815  SpO2 99 % 02/23/21 0815  Vitals shown include unvalidated device data.  Last Pain:  Vitals:   02/23/21 0646  TempSrc: Oral  PainSc: 0-No pain         Complications:None

## 2021-02-24 ENCOUNTER — Encounter: Payer: Self-pay | Admitting: Podiatry

## 2021-02-24 DIAGNOSIS — M8600 Acute hematogenous osteomyelitis, unspecified site: Secondary | ICD-10-CM

## 2021-02-24 LAB — BASIC METABOLIC PANEL
Anion gap: 12 (ref 5–15)
BUN: 22 mg/dL (ref 8–23)
CO2: 19 mmol/L — ABNORMAL LOW (ref 22–32)
Calcium: 9.3 mg/dL (ref 8.9–10.3)
Chloride: 107 mmol/L (ref 98–111)
Creatinine, Ser: 1.24 mg/dL (ref 0.61–1.24)
GFR, Estimated: 57 mL/min — ABNORMAL LOW (ref >60)
Glucose, Bld: 113 mg/dL — ABNORMAL HIGH (ref 70–99)
Potassium: 4.8 mmol/L (ref 3.5–5.1)
Sodium: 138 mmol/L (ref 135–145)

## 2021-02-24 LAB — CBC
HCT: 37.2 % — ABNORMAL LOW (ref 39.0–52.0)
Hemoglobin: 11.9 g/dL — ABNORMAL LOW (ref 13.0–17.0)
MCH: 26.9 pg (ref 26.0–34.0)
MCHC: 32 g/dL (ref 30.0–36.0)
MCV: 84 fL (ref 80.0–100.0)
Platelets: 202 10*3/uL (ref 150–400)
RBC: 4.43 MIL/uL (ref 4.22–5.81)
RDW: 15.3 % (ref 11.5–15.5)
WBC: 14.1 10*3/uL — ABNORMAL HIGH (ref 4.0–10.5)
nRBC: 0 % (ref 0.0–0.2)

## 2021-02-24 LAB — PROTIME-INR
INR: 1.6 — ABNORMAL HIGH (ref 0.8–1.2)
Prothrombin Time: 19 seconds — ABNORMAL HIGH (ref 11.4–15.2)

## 2021-02-24 LAB — GLUCOSE, CAPILLARY
Glucose-Capillary: 116 mg/dL — ABNORMAL HIGH (ref 70–99)
Glucose-Capillary: 175 mg/dL — ABNORMAL HIGH (ref 70–99)

## 2021-02-24 MED ORDER — DOXYCYCLINE HYCLATE 50 MG PO CAPS: 100.0000 mg | ORAL_CAPSULE | Freq: Two times a day (BID) | ORAL | 0 refills | Status: AC

## 2021-02-24 MED ORDER — SODIUM CHLORIDE 0.9 % IV SOLN: INTRAVENOUS | Status: DC | PRN

## 2021-02-24 MED ORDER — DOXYCYCLINE HYCLATE 50 MG PO CAPS: 100.0000 mg | ORAL_CAPSULE | Freq: Two times a day (BID) | ORAL | 0 refills | Status: DC

## 2021-02-24 NOTE — Progress Notes (Signed)
Nsg Discharge Note  Admit Date:  02/21/2021 Discharge date: 02/24/2021   Lucas Caldwell to be D/C'd Home per MD order.  AVS completed.  Patient/caregiver able to verbalize understanding.  Discharge Medication: Allergies as of 02/24/2021       Reactions   Clopidogrel Anaphylaxis, Other (See Comments)   altered mental status;  (electrolytes "out of wack" and confusion per family; THEY DO NOT REMEMBER ANY OTHER REACTION)- MSB 10/10/15   Ciprofloxacin Itching   Beta Adrenergic Blockers    Nightmares   Mirtazapine Diarrhea   Bad dreams   Spironolactone Other (See Comments)   gynecomastia        Medication List     STOP taking these medications    [name] KwikPen 100 UNIT/ML   potassium chloride 10 MEQ tablet Commonly known as: KLOR-CON   torsemide 20 MG tablet Commonly known as: DEMADEX       TAKE these medications    acetaminophen 500 MG tablet Commonly known as: TYLENOL Take 1,000 mg by mouth every 6 (six) hours as needed for moderate pain or headache.   allopurinol 100 MG tablet Commonly known as: ZYLOPRIM TAKE ONE TABLET BY MOUTH ONCE DAILY   aspirin 81 MG chewable tablet Chew 81 mg by mouth daily.   B-D SINGLE USE SWABS REGULAR Pads Use to check blood sugar up to 2 times daily   bosutinib 100 MG tablet Commonly known as: BOSULIF Take 100 mg by mouth at bedtime. Take with food.   Cholecalciferol 25 MCG (1000 UT) capsule Take 1,000 Units by mouth daily.   doxycycline 50 MG capsule Commonly known as: VIBRAMYCIN Take 2 capsules (100 mg total) by mouth 2 (two) times daily for 5 days.   gabapentin 300 MG capsule Commonly known as: NEURONTIN Take 300 mg by mouth 3 (three) times daily.   GOLD BOND EX Apply 1 application topically daily as needed (muscle pain).   Insulin Pen Needle 31G X 5 MM Misc 31 g by Does not apply route as directed.   isosorbide mononitrate 60 MG 24 hr tablet Commonly known as: IMDUR Take 0.5 tablets (30 mg total) by  mouth daily.   Jardiance 10 MG Tabs tablet Generic drug: empagliflozin Take 1 tablet (10 mg total) by mouth daily with breakfast.   levothyroxine 25 MCG tablet Commonly known as: SYNTHROID TAKE ONE TABLET BY MOUTH EVERY MORNING BEFORE BREAKFAST What changed: when to take this   losartan 25 MG tablet Commonly known as: COZAAR Take 12.5 mg by mouth daily.   multivitamin with minerals Tabs tablet Take 1 tablet by mouth daily.   nitroGLYCERIN 0.4 MG SL tablet Commonly known as: NITROSTAT Place 1 tablet (0.4 mg total) under the tongue every 5 (five) minutes x 3 doses as needed for chest pain.   omeprazole 20 MG capsule Commonly known as: PRILOSEC Take 20 mg by mouth in the morning.   polyethylene glycol 17 g packet Commonly known as: MIRALAX / GLYCOLAX Take 17 g by mouth daily as needed for mild constipation.   ranolazine 500 MG 12 hr tablet Commonly known as: RANEXA TAKE ONE TABLET BY MOUTH TWICE DAILY   rosuvastatin 5 MG tablet Commonly known as: CRESTOR TAKE ONE TABLET BY MOUTH ONCE DAILY   SYSTANE OP Place 1 drop into both eyes 3 (three) times daily as needed (dry/irritated eyes.).   tamsulosin 0.4 MG Caps capsule Commonly known as: FLOMAX Take 1 capsule (0.4 mg total) by mouth daily after supper.   traMADol 50 MG tablet  Commonly known as: ULTRAM Take 1-2 tablets (50-100 mg total) by mouth at bedtime as needed.   True Metrix Meter Devi Use to check blood sugar up to 2 times daily   Trulicity 1.5 HW/3.8UE Sopn Generic drug: Dulaglutide Inject 1.5 mg into the skin once a week.   warfarin 3 MG tablet Commonly known as: COUMADIN Take as directed. If you are unsure how to take this medication, talk to your nurse or doctor. Original instructions: TAKE 1 TO 1 & 1/2 TABLETS BY MOUTH AS DIRECTED BY THE COUMADIN CLINIC        Discharge Assessment: Vitals:   02/24/21 0817 02/24/21 1219  BP: 103/78 124/66  Pulse: 77 86  Resp: 18 15  Temp: 98.2 F (36.8 C)  98.3 F (36.8 C)  SpO2: 98% 97%   Skin clean, dry and intact without evidence of skin break down, no evidence of skin tears noted. IV catheter discontinued intact. Site without signs and symptoms of complications - no redness or edema noted at insertion site, patient denies c/o pain - only slight tenderness at site.  Dressing with slight pressure applied. Right foot dressing clean dry and intact with post op shoe on   D/c Instructions-Education: Discharge instructions given to patient/family with verbalized understanding. D/c education completed with patient/family including follow up instructions, medication list, d/c activities limitations if indicated, with other d/c instructions as indicated by MD - patient able to verbalize understanding, all questions fully answered. Patient instructed to return to ED, call 911, or call MD for any changes in condition.  Patient escorted via [place], and D/C home via private auto.  Geoffrey Mankin, Jolene Schimke, RN 02/24/2021 2:03 PM

## 2021-02-24 NOTE — Progress Notes (Signed)
Daily Progress Note   Subjective  - 1 Day Post-Op  Follow-up day 1 right great toe amputation.  Patient without complaints.  Has apparently had some delirium while hospitalized.  Objective Vitals:   02/23/21 1627 02/23/21 2052 02/24/21 0428 02/24/21 0817  BP:  130/83 (!) 150/83 103/78  Pulse: 100 (!) 108 86 77  Resp:  16 14 18   Temp:   97.7 F (36.5 C) 98.2 F (36.8 C)  TempSrc:   Oral   SpO2: 98% (!) 76% 100% 98%  Weight:      Height:        Physical Exam: The incision sites are well coapted.  Good perfusion of the skin flaps at this time.  Minimal residual erythema to the foot at this point.  No purulence noted.  Laboratory CBC    Component Value Date/Time   WBC 14.1 (H) 02/24/2021 0433   HGB 11.9 (L) 02/24/2021 0433   HGB 13.2 06/29/2020 1411   HCT 37.2 (L) 02/24/2021 0433   HCT 39.4 06/29/2020 1411   PLT 202 02/24/2021 0433   PLT 182 06/29/2020 1411    BMET    Component Value Date/Time   NA 138 02/24/2021 0433   NA 134 06/29/2020 1411   NA 134 (L) 05/18/2012 0440   K 4.8 02/24/2021 0433   K 4.2 05/18/2012 0440   CL 107 02/24/2021 0433   CL 103 05/18/2012 0440   CO2 19 (L) 02/24/2021 0433   CO2 25 05/18/2012 0440   GLUCOSE 113 (H) 02/24/2021 0433   GLUCOSE 220 (H) 05/18/2012 0440   BUN 22 02/24/2021 0433   BUN 22 06/29/2020 1411   BUN 38 (H) 05/18/2012 0440   CREATININE 1.24 02/24/2021 0433   CREATININE 1.72 (H) 04/13/2020 1117   CALCIUM 9.3 02/24/2021 0433   CALCIUM 8.4 (L) 05/18/2012 0440   GFRNONAA 57 (L) 02/24/2021 0433   GFRNONAA 36 (L) 04/13/2020 1117   GFRAA 41 (L) 04/13/2020 1117    Assessment/Planning: Ulcer with abscess and osteomyelitis right great toe  I suspect the amputation was therapeutic of osteomyelitis.  Likely some residual bacterial contamination of the soft tissue.  Would recommend continued antibiotics for the next week. Patient can ambulate as tolerated with postop shoe on at all times.  PT is seeing currently. From a  podiatry standpoint the patient is stable for discharge.  Patient should follow-up in outpatient clinic with me this week for dressing change. Keep dressing clean dry and intact this week.  No need for routine dressing changes at this time.  Samara Deist A  02/24/2021, 10:50 AM

## 2021-02-24 NOTE — Progress Notes (Signed)
SATURATION QUALIFICATIONS:   Patient Saturations on Room Air at Rest = 99%  Patient Saturations on Room Air while Ambulating = 98%   Please briefly explain why patient needs home oxygen:  HR increased from 98 to 116 per PT

## 2021-02-24 NOTE — Progress Notes (Signed)
Physical Therapy Treatment Patient Details Name: Lucas Caldwell MRN: [id number] DOB: 08/24/1933 Today's Date: 02/24/2021   History of Present Illness Patient is a 87 y.o. male with a PMH significant for OA, right eye blindness, BPH, HTN, HLD, hypothyroidism, IDA, OSA, migraines, PAF, orthostatic hypotension, history of GI bleed from duodenal ulcer, RBBB, type II DM, GERD. Patient recieved a great right toe amputation on 02/23/21 due to osteomyelitis.    PT Comments    Patient tolerated session well and was agreeable to treatment. Patient's mood improved compared to yesterday, however was unaware of location. Patient was oriented to self, and situation. Patient continues to have impaired safety awareness during functional activities. No pain reported during session. Min Assist required for sit to stands and ambulation. Patient only able to tolerate ambulation within the room due to reports of dizziness. O2 ranged between 96-98% on room air. HR ranged from 93-116bpm (increased with functional mobility) throughout session. Daphne,NA present throughout session. Upon standing patient had episode of urinary incontinence. Patient is progressing nicely towards goals. Patient would continue to benefit from skilled physical therapy during hospital stay to work on safety awareness, gait training, and bilateral LE strength to return patient to PLOF.   Recommendations for follow up therapy are one component of a multi-disciplinary discharge planning process, led by the attending physician.  Recommendations may be updated based on patient status, additional functional criteria and insurance authorization.  Follow Up Recommendations  Home health PT     Assistance Recommended at Discharge Intermittent Supervision/Assistance  Equipment Recommendations  None recommended by PT    Recommendations for Other Services       Precautions / Restrictions Precautions Precautions: Fall Precaution Comments: R post  op shoe Restrictions Weight Bearing Restrictions: Yes RLE Weight Bearing: Weight bearing as tolerated     Mobility  Bed Mobility Overal bed mobility: Needs Assistance Bed Mobility: Supine to Sit;Sit to Supine     Supine to sit: Supervision (LUE support from bed rail to pull up to sitting) Sit to supine: Supervision        Transfers Overall transfer level: Needs assistance Equipment used: Straight cane Transfers: Sit to/from Stand Sit to Stand: Min assist           General transfer comment: multiple attempted required by patient prior to standing; completed w/ Min A; cueing to push up from bed; when completing stand to sit no eccentric control noted in movement (patient "plopped" onto the bed without warning)    Ambulation/Gait Ambulation/Gait assistance: Min assist Gait Distance (Feet): 15 Feet Assistive device: Straight cane Gait Pattern/deviations: Shuffle;Decreased step length - right;Decreased step length - left;Decreased stride length;Narrow base of support;Antalgic Gait velocity: decreased cadence     General Gait Details: slight lateral sway noted during gait; patient reported dizziness during ambulation; was unable to make it from bed to door (completed bed>bathroom> back to bed); O2 remained within 90s throughout short burst of ambulation.   Stairs             Wheelchair Mobility    Modified Rankin (Stroke Patients Only)       Balance Overall balance assessment: Modified Independent Sitting-balance support: Feet supported;No upper extremity supported Sitting balance-Leahy Scale: Good       Standing balance-Leahy Scale: Poor Standing balance comment: lateral sway noted in standing and during ambulating                            Cognition Arousal/Alertness:  Awake/alert   Overall Cognitive Status: Impaired/Different from baseline Area of Impairment: Orientation;Following commands;Safety/judgement                  Orientation Level: Disoriented to;Place     Following Commands: Follows one step commands consistently Safety/Judgement: Decreased awareness of safety     General Comments: A&O to situation, name, DOB; unaware of location        Exercises Other Exercises Other Exercises: Patient re-educated on weight bearing precautions (WBAT)    General Comments        Pertinent Vitals/Pain Pain Assessment: 0-10 Pain Score: 0-No pain Pain Intervention(s): Limited activity within patient's tolerance;Monitored during session;Repositioned    Home Living                          Prior Function            PT Goals (current goals can now be found in the care plan section) Acute Rehab PT Goals Patient Stated Goal: to go home PT Goal Formulation: With patient Time For Goal Achievement: 03/09/21 Potential to Achieve Goals: Good Progress towards PT goals: Progressing toward goals    Frequency    7X/week      PT Plan Current plan remains appropriate    Co-evaluation              AM-PAC PT "6 Clicks" Mobility   Outcome Measure  Help needed turning from your back to your side while in a flat bed without using bedrails?: None Help needed moving from lying on your back to sitting on the side of a flat bed without using bedrails?: None Help needed moving to and from a bed to a chair (including a wheelchair)?: A Little Help needed standing up from a chair using your arms (e.g., wheelchair or bedside chair)?: A Little Help needed to walk in hospital room?: A Little Help needed climbing 3-5 steps with a railing? : A Lot 6 Click Score: 19    End of Session Equipment Utilized During Treatment: Gait belt Activity Tolerance: Patient tolerated treatment well;No increased pain Patient left: in bed;with call bell/phone within reach;with bed alarm set Nurse Communication: Mobility status PT Visit Diagnosis: Difficulty in walking, not elsewhere classified (R26.2);Muscle  weakness (generalized) (M62.81);Other abnormalities of gait and mobility (R26.89);Unsteadiness on feet (R26.81)     Time: 1032-1050 PT Time Calculation (min) (ACUTE ONLY): 18 min  Charges:  $Therapeutic Activity: 8-22 mins                     Iva Boop, PT  02/24/21. 11:19 AM

## 2021-02-24 NOTE — Plan of Care (Signed)
Patient discharged to go home. Son and wife by bedside  and will drive patient home

## 2021-02-24 NOTE — TOC Progression Note (Signed)
Transition of Care [facility]) - Progression Note    Patient Details  Name: Lucas Caldwell MRN: [id number] Date of Birth: Apr 23, 1933  [facility] CM/SW Contact  Boris Sharper, LCSW Phone Number: 02/24/2021, 10:19 AM  Clinical Narrative:    CSW dicussed PT recommendations with pt and he was in agreement with [facility]. CSW arranged [place] [facility] with Tommi Rumps at [place], [facility] is available to accept pt. No equipment recommended and pt currently has Walker and walking cane.   Expected Discharge Plan:  (TBD) Barriers to Discharge: Continued Medical Work up  Expected Discharge Plan and Services Expected Discharge Plan:  (TBD)   Discharge Planning Services: CM Consult   Living arrangements for the past 2 months: Single Family Home                                       Social Determinants of Health (SDOH) Interventions    Readmission Risk Interventions No flowsheet data found.

## 2021-02-24 NOTE — Discharge Summary (Signed)
Physician Discharge Summary  Lucas Caldwell WOE:[id number] DOB: 1934/03/01 DOA: 02/21/2021  PCP: [name], DO  Admit date: 02/21/2021 Discharge date: 02/25/2021  Admitted From: Home Disposition: Home  Recommendations for Outpatient Follow-up:  Follow up with PCP within 1-2 weeks to monitor for dementia. Patient exhibited severe delirium while inpatient Follow up with podiatry 1-2 weeks for surgical site evaluation  Discharge Condition:stable, improved CODE STATUS:  Code Status: Prior  Regular healthy diet  Brief/Interim Summary: Pt presented with osteomyelitis and non-healing ulcer of R great toe. Podiatry was consulted and performed toe amputation. He was treated with antibiotics for residual local infection and tolerated all well. His other chronic conditions were managed with home medications.  His admission was complicated by severe delirium at night in which he was aggressive toward staff and not redirectable by family members. He had no recollection of the events the next days.  On day of discharge, his pain was well controlled and he was oriented x3.   Discharge Diagnoses:  Principal Problem:   Diabetic foot ulcer ([place]) Active Problems:   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 heart with stable angina pectoris (HCC)   Essential hypertension   Paroxysmal atrial fibrillation (HCC)   Ulcerated, foot, right, with necrosis of bone (HCC)   Osteomyelitis of great toe of right foot ([place])   Controlled type 2 diabetes mellitus with diabetic nephropathy, with long-term current use of insulin (HCC)   Centrilobular emphysema (HCC)   CKD (chronic kidney disease), stage IV ([place])   Obstructive sleep apnea   Acute hematogenous osteomyelitis [facility])   Discharge Instructions     Discharge patient   Complete by: As directed    Discharge disposition: 01-Home or Self Care   Discharge patient date: 02/24/2021      Allergies as of 02/24/2021       Reactions    Clopidogrel Anaphylaxis, Other (See Comments)   altered mental status;  (electrolytes "out of wack" and confusion per family; THEY DO NOT REMEMBER ANY OTHER REACTION)- MSB 10/10/15   Ciprofloxacin Itching   Beta Adrenergic Blockers    Nightmares   Mirtazapine Diarrhea   Bad dreams   Spironolactone Other (See Comments)   gynecomastia        Medication List     STOP taking these medications    [name] KwikPen 100 UNIT/ML   potassium chloride 10 MEQ tablet Commonly known as: KLOR-CON   torsemide 20 MG tablet Commonly known as: DEMADEX       TAKE these medications    acetaminophen 500 MG tablet Commonly known as: TYLENOL Take 1,000 mg by mouth every 6 (six) hours as needed for moderate pain or headache.   allopurinol 100 MG tablet Commonly known as: ZYLOPRIM TAKE ONE TABLET BY MOUTH ONCE DAILY   aspirin 81 MG chewable tablet Chew 81 mg by mouth daily.   B-D SINGLE USE SWABS REGULAR Pads Use to check blood sugar up to 2 times daily   bosutinib 100 MG tablet Commonly known as: BOSULIF Take 100 mg by mouth at bedtime. Take with food.   Cholecalciferol 25 MCG (1000 UT) capsule Take 1,000 Units by mouth daily.   doxycycline 50 MG capsule Commonly known as: VIBRAMYCIN Take 2 capsules (100 mg total) by mouth 2 (two) times daily for 5 days.   gabapentin 300 MG capsule Commonly known as: NEURONTIN Take 300 mg by mouth 3 (three) times daily.   GOLD BOND EX Apply 1 application topically daily as needed (muscle  pain).   Insulin Pen Needle 31G X 5 MM Misc 31 g by Does not apply route as directed.   isosorbide mononitrate 60 MG 24 hr tablet Commonly known as: IMDUR Take 0.5 tablets (30 mg total) by mouth daily.   Jardiance 10 MG Tabs tablet Generic drug: empagliflozin Take 1 tablet (10 mg total) by mouth daily with breakfast.   levothyroxine 25 MCG tablet Commonly known as: SYNTHROID TAKE ONE TABLET BY MOUTH EVERY MORNING BEFORE BREAKFAST What changed:  when to take this   losartan 25 MG tablet Commonly known as: COZAAR Take 12.5 mg by mouth daily.   multivitamin with minerals Tabs tablet Take 1 tablet by mouth daily.   nitroGLYCERIN 0.4 MG SL tablet Commonly known as: NITROSTAT Place 1 tablet (0.4 mg total) under the tongue every 5 (five) minutes x 3 doses as needed for chest pain.   omeprazole 20 MG capsule Commonly known as: PRILOSEC Take 20 mg by mouth in the morning.   polyethylene glycol 17 g packet Commonly known as: MIRALAX / GLYCOLAX Take 17 g by mouth daily as needed for mild constipation.   ranolazine 500 MG 12 hr tablet Commonly known as: RANEXA TAKE ONE TABLET BY MOUTH TWICE DAILY   rosuvastatin 5 MG tablet Commonly known as: CRESTOR TAKE ONE TABLET BY MOUTH ONCE DAILY   SYSTANE OP Place 1 drop into both eyes 3 (three) times daily as needed (dry/irritated eyes.).   tamsulosin 0.4 MG Caps capsule Commonly known as: FLOMAX Take 1 capsule (0.4 mg total) by mouth daily after supper.   traMADol 50 MG tablet Commonly known as: ULTRAM Take 1-2 tablets (50-100 mg total) by mouth at bedtime as needed.   True Metrix Meter Devi Use to check blood sugar up to 2 times daily   Trulicity 1.5 NG/2.9BM Sopn Generic drug: Dulaglutide Inject 1.5 mg into the skin once a week.   warfarin 3 MG tablet Commonly known as: COUMADIN Take as directed. If you are unsure how to take this medication, talk to your nurse or doctor. Original instructions: TAKE 1 TO 1 & 1/2 TABLETS BY MOUTH AS DIRECTED BY THE COUMADIN CLINIC        Allergies  Allergen Reactions   Clopidogrel Anaphylaxis and Other (See Comments)    altered mental status;  (electrolytes "out of wack" and confusion per family; THEY DO NOT REMEMBER ANY OTHER REACTION)- MSB 10/10/15   Ciprofloxacin Itching   Beta Adrenergic Blockers     Nightmares   Mirtazapine Diarrhea    Bad dreams    Spironolactone Other (See Comments)    gynecomastia     Consultations: podiatry  Procedures/Studies: MR FOOT RIGHT WO CONTRAST  Result Date: 02/22/2021 CLINICAL DATA:  Diabetic foot swelling. EXAM: MRI OF THE RIGHT FOREFOOT WITHOUT CONTRAST TECHNIQUE: Multiplanar, multisequence MR imaging of the right forefoot was performed. No intravenous contrast was administered. COMPARISON:  Radiographs 02/21/2021 FINDINGS: Despite efforts by the technologist and patient, motion artifact is present on today's exam and could not be eliminated. This reduces exam sensitivity and specificity. Bones/Joint/Cartilage Prior amputation of the great toe at the interphalangeal joint. Mild but abnormal edema distally in the proximal phalanx great toe for example on image 13 series 7 suspicious for osteomyelitis. Ligaments The Lisfranc ligament appears grossly intact. Muscles and Tendons Generalized atrophy of the regional musculature of the foot. Soft tissues There is abnormal edema in the great toe with effacement of the normal fatty signal along the distal medial plantar margin of the amputation  site for example on images 12 through 15 of series 7, suspicious for localized cellulitis adjacent to the region of suspected osteomyelitis. Dorsal subcutaneous edema in the forefoot is nonspecific, but cellulitis is not excluded. IMPRESSION: 1. Suspected osteomyelitis distally in the proximal phalanx great toe. The distal phalanx of the great toe has been previously amputated. There is effacement of normal fatty signal in the remaining great toe distally along the plantar-medial margin adjacent to the region of marrow edema in the proximal phalanx. 2. Dorsal subcutaneous edema along the forefoot could be sterile or could reflect cellulitis. Electronically Signed   By: Van Clines M.D.   On: 02/22/2021 21:27   US ARTERIAL ABI (SCREENING LOWER EXTREMITY)  Result Date: 02/22/2021 CLINICAL DATA:  87 year old male with a history of cardiac disease EXAM: NONINVASIVE PHYSIOLOGIC  VASCULAR STUDY OF BILATERAL LOWER EXTREMITIES TECHNIQUE: Evaluation of both lower extremities was performed at rest, including calculation of ankle-brachial indices, multiple segmental pressure evaluation, segmental Doppler and segmental pulse volume recording. COMPARISON:  None. FINDINGS: Right ABI:  0.48 Left ABI:  Noncompressible Right Lower Extremity: Segmental Doppler at the right ankle demonstrates monophasic waveform of the dorsalis pedis and biphasic blunted posterior tibial waveform Left Lower Extremity: Segmental Doppler at the left ankle demonstrates monophasic waveforms IMPRESSION: Right: Resting ABI in the moderate range arterial occlusive disease. Segmental Doppler at the right ankle demonstrates evidence of more proximal occlusive disease. Left: Resting ABI noncompressible Segmental Doppler at the left ankle demonstrates evidence of more proximal occlusive disease Signed, Dulcy Fanny. Dellia Nims, RPVI [facility] Radiology Electronically Signed   By: Corrie Mckusick D.O.   On: 02/22/2021 08:56   DG Foot 2 Views Right  Result Date: 02/21/2021 CLINICAL DATA:  Toe infection EXAM: RIGHT FOOT - 2 VIEW COMPARISON:  None. FINDINGS: No fracture or malalignment. Vascular calcifications. No soft tissue emphysema. Previous amputation at the level of the first IP joint. No erosive change or osseous destruction IMPRESSION: Partial amputation first digit.  No acute osseous abnormality Electronically Signed   By: Donavan Foil M.D.   On: 02/21/2021 17:36    Subjective: Patient feels well today. He denies pain in his foot. He does not remember being confused or agitated overnight. Denies any complaints today.  Discharge Exam: Vitals:   02/24/21 0817 02/24/21 1219  BP: 103/78 124/66  Pulse: 77 86  Resp: 18 15  Temp: 98.2 F (36.8 C) 98.3 F (36.8 C)  SpO2: 98% 97%    General: Pt is alert, awake, not in acute distress Cardiovascular: RRR, S1/S2 +, no  rubs, no gallops Respiratory: CTA bilaterally, no wheezing, no rhonchi Abdominal: Soft, NT, ND, bowel sounds + Extremities: no edema, no cyanosis. Right foot in clean, dry gauze. Distally was neurovascularly intact.  Labs: Basic Metabolic Panel: Recent Labs  Lab 02/21/21 1127 02/21/21 1944 02/22/21 0500 02/23/21 0118 02/24/21 0433  NA 134*  --  133* 135 138  K 3.9  --  3.8 4.0 4.8  CL 101  --  101 106 107  CO2 23  --  24 22 19*  GLUCOSE 212*  --  83 90 113*  BUN 39*  --  28* 19 22  CREATININE 1.83* 1.50* 1.32* 1.23 1.24  CALCIUM 8.8*  --  8.5* 8.6* 9.3   CBC: Recent Labs  Lab 02/21/21 1127 02/21/21 1944 02/22/21 0500 02/24/21 0433  WBC 10.5 11.1* 11.6* 14.1*  NEUTROABS 8.1*  --   --   --   HGB 12.8*  12.1* 12.3* 11.9*  HCT 39.3 38.2* 38.7* 37.2*  MCV 85.4 86.2 86.6 84.0  PLT 164 167 181 202    Microbiology Recent Results (from the past 240 hour(s))  Culture, blood (routine x 2)     Status: Abnormal (Preliminary result)   Collection Time: 02/21/21  5:10 PM   Specimen: BLOOD  Result Value Ref Range Status   Specimen Description   Final    BLOOD BRH Performed at [facility], [address] [place], [place] [address]    Special Requests   Final    BOTTLES DRAWN AEROBIC AND ANAEROBIC BCLV Performed at [facility], [address], [place], [place] [address]    Culture  Setup Time   Final    GRAM POSITIVE COCCI AEROBIC BOTTLE ONLY Organism ID to follow CRITICAL RESULT CALLED TO, READ BACK BY AND VERIFIED WITH: SHEEMA HALLAJI 02/22/21 1201 [place] CRITICAL RESULT CALLED TO, READ BACK BY AND VERIFIED WITH: CARISSA [facility] 02/22/21 1700 KLW Performed at [facility], [address] [place], [place] [address]    Culture (A)  Final    STAPHYLOCOCCUS HOMINIS THE SIGNIFICANCE OF ISOLATING THIS ORGANISM FROM A SINGLE SET OF BLOOD CULTURES WHEN MULTIPLE SETS ARE DRAWN IS UNCERTAIN. PLEASE NOTIFY THE  MICROBIOLOGY DEPARTMENT WITHIN ONE WEEK IF SPECIATION AND SENSITIVITIES ARE REQUIRED. CULTURE REINCUBATED FOR BETTER GROWTH Performed at [facility], [place] [address] [place], [place] [address]    Report Status PENDING  Incomplete  Culture, blood (routine x 2)     Status: None (Preliminary result)   Collection Time: 02/21/21  6:00 PM   Specimen: BLOOD  Result Value Ref Range Status   Specimen Description BLOOD LAC  Final   Special Requests BOTTLES DRAWN AEROBIC AND ANAEROBIC BCLV  Final   Culture   Final    NO GROWTH 4 DAYS Performed at [facility], [address] [place], [place] [address]    Report Status PENDING  Incomplete  Blood Culture ID Panel (Reflexed)     Status: Abnormal   Collection Time: 02/21/21  6:00 PM  Result Value Ref Range Status   Enterococcus faecalis NOT DETECTED NOT DETECTED Final   Enterococcus Faecium NOT DETECTED NOT DETECTED Final   Listeria monocytogenes NOT DETECTED NOT DETECTED Final   Staphylococcus species DETECTED (A) NOT DETECTED Final    Comment: RBV SHEEMA HALLAJI 02/22/21 1201 MW    Staphylococcus aureus (BCID) NOT DETECTED NOT DETECTED Final   Staphylococcus epidermidis NOT DETECTED NOT DETECTED Final   Staphylococcus lugdunensis NOT DETECTED NOT DETECTED Final   Streptococcus species NOT DETECTED NOT DETECTED Final   Streptococcus agalactiae NOT DETECTED NOT DETECTED Final   Streptococcus pneumoniae NOT DETECTED NOT DETECTED Final   Streptococcus pyogenes NOT DETECTED NOT DETECTED Final   A.calcoaceticus-baumannii NOT DETECTED NOT DETECTED Final   Bacteroides fragilis NOT DETECTED NOT DETECTED Final   Enterobacterales NOT DETECTED NOT DETECTED Final   Enterobacter cloacae complex NOT DETECTED NOT DETECTED Final   Escherichia coli NOT DETECTED NOT DETECTED Final   Klebsiella aerogenes NOT DETECTED NOT DETECTED Final   Klebsiella oxytoca NOT DETECTED NOT DETECTED Final   Klebsiella pneumoniae NOT DETECTED NOT DETECTED Final    Proteus species NOT DETECTED NOT DETECTED Final   Salmonella species NOT DETECTED NOT DETECTED Final   Serratia marcescens NOT DETECTED NOT DETECTED Final   Haemophilus influenzae NOT DETECTED NOT DETECTED Final   Neisseria meningitidis NOT DETECTED NOT DETECTED Final  Pseudomonas aeruginosa NOT DETECTED NOT DETECTED Final   Stenotrophomonas maltophilia NOT DETECTED NOT DETECTED Final   Candida albicans NOT DETECTED NOT DETECTED Final   Candida auris NOT DETECTED NOT DETECTED Final   Candida glabrata NOT DETECTED NOT DETECTED Final   Candida krusei NOT DETECTED NOT DETECTED Final   Candida parapsilosis NOT DETECTED NOT DETECTED Final   Candida tropicalis NOT DETECTED NOT DETECTED Final   Cryptococcus neoformans/gattii NOT DETECTED NOT DETECTED Final    Comment: Performed at [facility], [address] [place], [place] [address]  Blood Culture ID Panel (Reflexed)     Status: None   Collection Time: 02/21/21  6:00 PM  Result Value Ref Range Status   Enterococcus faecalis NOT DETECTED NOT DETECTED Final   Enterococcus Faecium NOT DETECTED NOT DETECTED Final   Listeria monocytogenes NOT DETECTED NOT DETECTED Final    Comment: RESULT CALLED TO, READ BACK BY AND VERIFIED WITH: CARISSA DOLAN 02/22/21 1700 KLW    Staphylococcus species NOT DETECTED NOT DETECTED Final   Staphylococcus aureus (BCID) NOT DETECTED NOT DETECTED Final   Staphylococcus epidermidis NOT DETECTED NOT DETECTED Final   Staphylococcus lugdunensis NOT DETECTED NOT DETECTED Final   Streptococcus species NOT DETECTED NOT DETECTED Final   Streptococcus agalactiae NOT DETECTED NOT DETECTED Final   Streptococcus pneumoniae NOT DETECTED NOT DETECTED Final   Streptococcus pyogenes NOT DETECTED NOT DETECTED Final   A.calcoaceticus-baumannii NOT DETECTED NOT DETECTED Final   Bacteroides fragilis NOT DETECTED NOT DETECTED Final   Enterobacterales NOT DETECTED NOT DETECTED Final   Enterobacter cloacae complex NOT  DETECTED NOT DETECTED Final   Escherichia coli NOT DETECTED NOT DETECTED Final   Klebsiella aerogenes NOT DETECTED NOT DETECTED Final   Klebsiella oxytoca NOT DETECTED NOT DETECTED Final   Klebsiella pneumoniae NOT DETECTED NOT DETECTED Final   Proteus species NOT DETECTED NOT DETECTED Final   Salmonella species NOT DETECTED NOT DETECTED Final   Serratia marcescens NOT DETECTED NOT DETECTED Final   Haemophilus influenzae NOT DETECTED NOT DETECTED Final   Neisseria meningitidis NOT DETECTED NOT DETECTED Final   Pseudomonas aeruginosa NOT DETECTED NOT DETECTED Final   Stenotrophomonas maltophilia NOT DETECTED NOT DETECTED Final   Candida albicans NOT DETECTED NOT DETECTED Final   Candida auris NOT DETECTED NOT DETECTED Final   Candida glabrata NOT DETECTED NOT DETECTED Final   Candida krusei NOT DETECTED NOT DETECTED Final   Candida parapsilosis NOT DETECTED NOT DETECTED Final   Candida tropicalis NOT DETECTED NOT DETECTED Final   Cryptococcus neoformans/gattii NOT DETECTED NOT DETECTED Final    Comment: Performed at [facility], [address] [place], [place] [address]  Resp Panel by RT-PCR (Flu A&B, Covid) Nasopharyngeal Swab     Status: None   Collection Time: 02/21/21  7:44 PM   Specimen: Nasopharyngeal Swab; Nasopharyngeal(NP) swabs in vial transport medium  Result Value Ref Range Status   SARS Coronavirus 2 by RT PCR NEGATIVE NEGATIVE Final    Comment: (NOTE) SARS-CoV-2 target nucleic acids are NOT DETECTED.  The SARS-CoV-2 RNA is generally detectable in upper respiratory specimens during the acute phase of infection. The lowest concentration of SARS-CoV-2 viral copies this assay can detect is 138 copies/mL. A negative result does not preclude SARS-Cov-2 infection and should not be used as the sole basis for treatment or other patient management decisions. A negative result may occur with  improper specimen collection/handling, submission of specimen other than  nasopharyngeal swab, presence of viral mutation(s) within the areas targeted by  this assay, and inadequate number of viral copies(<138 copies/mL). A negative result must be combined with clinical observations, patient history, and epidemiological information. The expected result is Negative.  Fact Sheet for Patients:  EntrepreneurPulse.com.au  Fact Sheet for Healthcare Providers:  IncredibleEmployment.be  This test is no t yet approved or cleared by the [place] FDA and  has been authorized for detection and/or diagnosis of SARS-CoV-2 by FDA under an Emergency Use Authorization (EUA). This EUA will remain  in effect (meaning this test can be used) for the duration of the COVID-19 declaration under Section 564(b)(1) of the Act, 21 U.S.C.section 360bbb-3(b)(1), unless the authorization is terminated  or revoked sooner.       Influenza A by PCR NEGATIVE NEGATIVE Final   Influenza B by PCR NEGATIVE NEGATIVE Final    Comment: (NOTE) The Xpert Xpress SARS-CoV-2/FLU/RSV plus assay is intended as an aid in the diagnosis of influenza from Nasopharyngeal swab specimens and should not be used as a sole basis for treatment. Nasal washings and aspirates are unacceptable for Xpert Xpress SARS-CoV-2/FLU/RSV testing.  Fact Sheet for Patients: EntrepreneurPulse.com.au  Fact Sheet for Healthcare Providers: IncredibleEmployment.be  This test is not yet approved or cleared by the [place] FDA and has been authorized for detection and/or diagnosis of SARS-CoV-2 by FDA under an Emergency Use Authorization (EUA). This EUA will remain in effect (meaning this test can be used) for the duration of the COVID-19 declaration under Section 564(b)(1) of the Act, 21 U.S.C. section 360bbb-3(b)(1), unless the authorization is terminated or revoked.  Performed at [facility], [address] [place], [place]  [address]   Aerobic/Anaerobic Culture w Gram Stain (surgical/deep wound)     Status: None (Preliminary result)   Collection Time: 02/22/21 12:37 PM   Specimen: Wound  Result Value Ref Range Status   Specimen Description   Final    WOUND Performed at [facility], [address], [place], [place] [address]    Special Requests   Final    RIGHT FOOT Performed at [facility], [address] [place], [place] [address]    Gram Stain   Final    RARE WBC PRESENT, PREDOMINANTLY PMN NO ORGANISMS SEEN Performed at [facility], [place] [address], [place], [place] [address]    Culture   Final    CULTURE REINCUBATED FOR BETTER GROWTH NO ANAEROBES ISOLATED; CULTURE IN PROGRESS FOR 5 DAYS    Report Status PENDING  Incomplete  Aerobic/Anaerobic Culture w Gram Stain (surgical/deep wound)     Status: None (Preliminary result)   Collection Time: 02/23/21  8:10 AM   Specimen: Wound  Result Value Ref Range Status   Specimen Description   Final    WOUND Performed at [facility], [address] [place], [place] [address]    Special Requests   Final    NONE Performed at [facility], [address], [place], [place] [address]    Gram Stain   Final    NO WBC SEEN NO ORGANISMS SEEN Performed at [facility], [place] [address], [place],  [address]    Culture   Final    CULTURE REINCUBATED FOR BETTER GROWTH NO ANAEROBES ISOLATED; CULTURE IN PROGRESS FOR 5 DAYS    Report Status PENDING  Incomplete    Time coordinating discharge: Over 30 minutes  Richarda Osmond, MD  [facility] 02/25/2021, 5:51 PM

## 2021-02-26 ENCOUNTER — Telehealth: Payer: Self-pay

## 2021-02-26 LAB — CULTURE, BLOOD (ROUTINE X 2): Culture: NO GROWTH

## 2021-02-26 LAB — SURGICAL PATHOLOGY

## 2021-02-26 NOTE — Telephone Encounter (Signed)
Could not leave voicemail as mailbox was full

## 2021-02-27 ENCOUNTER — Telehealth: Payer: Self-pay

## 2021-02-27 LAB — AEROBIC/ANAEROBIC CULTURE W GRAM STAIN (SURGICAL/DEEP WOUND)

## 2021-02-27 NOTE — Telephone Encounter (Signed)
Okay to proceed  [name], DO [facility] 02/27/2021, 1:23 PM

## 2021-02-27 NOTE — Telephone Encounter (Signed)
Copied from [place] [phone]. Topic: Quick Communication - Home Health Verbal Orders >> Feb 26, 2021 11:00 AM Tessa Lerner A wrote: Caller/Agency: Vikki Ports / Santina Evans Number: [phone] Requesting OT/PT/Skilled Nursing/Social Work/Speech Therapy: skilled nursing for wound care on right foot  Frequency: evaluation / frequency tentative

## 2021-02-28 ENCOUNTER — Other Ambulatory Visit: Payer: Self-pay

## 2021-02-28 ENCOUNTER — Ambulatory Visit (INDEPENDENT_AMBULATORY_CARE_PROVIDER_SITE_OTHER): Payer: Medicare HMO

## 2021-02-28 DIAGNOSIS — I480 Paroxysmal atrial fibrillation: Secondary | ICD-10-CM | POA: Diagnosis not present

## 2021-02-28 DIAGNOSIS — Z5181 Encounter for therapeutic drug level monitoring: Secondary | ICD-10-CM | POA: Diagnosis not present

## 2021-02-28 DIAGNOSIS — Z7901 Long term (current) use of anticoagulants: Secondary | ICD-10-CM | POA: Diagnosis not present

## 2021-02-28 LAB — POCT INR: INR: 1.4 — AB (ref 2.0–3.0)

## 2021-02-28 LAB — AEROBIC/ANAEROBIC CULTURE W GRAM STAIN (SURGICAL/DEEP WOUND): Gram Stain: NONE SEEN

## 2021-02-28 NOTE — Patient Instructions (Signed)
-   take 2 tablets warfarin tonight and tomorrow, then - resume dosage of warfarin of 1.5 TABLETS EVERY DAY EXCEPT 1 TABLETS ON MONDAYS & FRIDAYS. - recheck INR in 2 weeks

## 2021-03-01 DIAGNOSIS — L03031 Cellulitis of right toe: Secondary | ICD-10-CM | POA: Diagnosis not present

## 2021-03-01 DIAGNOSIS — M86171 Other acute osteomyelitis, right ankle and foot: Secondary | ICD-10-CM | POA: Diagnosis not present

## 2021-03-01 DIAGNOSIS — L97514 Non-pressure chronic ulcer of other part of right foot with necrosis of bone: Secondary | ICD-10-CM | POA: Diagnosis not present

## 2021-03-01 NOTE — Telephone Encounter (Signed)
Lucas Caldwell is aware.

## 2021-03-02 ENCOUNTER — Telehealth: Payer: Self-pay

## 2021-03-02 DIAGNOSIS — I5042 Chronic combined systolic (congestive) and diastolic (congestive) heart failure: Secondary | ICD-10-CM | POA: Diagnosis not present

## 2021-03-02 DIAGNOSIS — D631 Anemia in chronic kidney disease: Secondary | ICD-10-CM | POA: Diagnosis not present

## 2021-03-02 DIAGNOSIS — N184 Chronic kidney disease, stage 4 (severe): Secondary | ICD-10-CM | POA: Diagnosis not present

## 2021-03-02 DIAGNOSIS — Z89411 Acquired absence of right great toe: Secondary | ICD-10-CM | POA: Diagnosis not present

## 2021-03-02 DIAGNOSIS — E1122 Type 2 diabetes mellitus with diabetic chronic kidney disease: Secondary | ICD-10-CM | POA: Diagnosis not present

## 2021-03-02 DIAGNOSIS — Z4781 Encounter for orthopedic aftercare following surgical amputation: Secondary | ICD-10-CM | POA: Diagnosis not present

## 2021-03-02 DIAGNOSIS — M1030 Gout due to renal impairment, unspecified site: Secondary | ICD-10-CM | POA: Diagnosis not present

## 2021-03-02 DIAGNOSIS — M86071 Acute hematogenous osteomyelitis, right ankle and foot: Secondary | ICD-10-CM | POA: Diagnosis not present

## 2021-03-02 DIAGNOSIS — I130 Hypertensive heart and chronic kidney disease with heart failure and stage 1 through stage 4 chronic kidney disease, or unspecified chronic kidney disease: Secondary | ICD-10-CM | POA: Diagnosis not present

## 2021-03-02 DIAGNOSIS — E1169 Type 2 diabetes mellitus with other specified complication: Secondary | ICD-10-CM | POA: Diagnosis not present

## 2021-03-02 LAB — MISC LABCORP TEST (SEND OUT)
LabCorp test name: 182261
Labcorp test code: 182261

## 2021-03-02 NOTE — Telephone Encounter (Signed)
°  Care Management   Follow Up Note   03/02/2021 Name: Lucas Caldwell MRN: [id number] DOB: October 21, 1933   Referred by: [name], DO Reason for referral : Chronic Care Management (RNCM: Follow up from post discharge from the hospital post amputation of right great toe and review of chronic diseases and care coordination needs )   An unsuccessful telephone outreach was attempted today. The patient was referred to the case management team for assistance with care management and care coordination.   Follow Up Plan: The care management team will reach out to the patient again over the next 5 to 14 days.   Noreene Larsson RN, MSN, [place] Mobile: [phone]

## 2021-03-03 ENCOUNTER — Other Ambulatory Visit: Payer: Self-pay | Admitting: Internal Medicine

## 2021-03-05 ENCOUNTER — Encounter: Payer: Self-pay | Admitting: Family Medicine

## 2021-03-05 ENCOUNTER — Other Ambulatory Visit: Payer: Self-pay

## 2021-03-05 ENCOUNTER — Inpatient Hospital Stay: Payer: Medicare HMO | Admitting: Family Medicine

## 2021-03-05 ENCOUNTER — Ambulatory Visit (INDEPENDENT_AMBULATORY_CARE_PROVIDER_SITE_OTHER): Payer: Medicare HMO | Admitting: Family Medicine

## 2021-03-05 VITALS — Wt 197.0 lb

## 2021-03-05 DIAGNOSIS — S98111A Complete traumatic amputation of right great toe, initial encounter: Secondary | ICD-10-CM | POA: Insufficient documentation

## 2021-03-05 DIAGNOSIS — E11621 Type 2 diabetes mellitus with foot ulcer: Secondary | ICD-10-CM | POA: Diagnosis not present

## 2021-03-05 DIAGNOSIS — L97509 Non-pressure chronic ulcer of other part of unspecified foot with unspecified severity: Secondary | ICD-10-CM | POA: Diagnosis not present

## 2021-03-05 NOTE — Patient Instructions (Addendum)
   Please schedule a Follow-up Appointment to: Return if symptoms worsen or fail to improve.  If you have any other questions or concerns, please feel free to call the office or send a message through MyChart. You may also schedule an earlier appointment if necessary.  Additionally, you may be receiving a survey about your experience at our office within a few days to 1 week by e-mail or mail. We value your feedback.  Cornelius Schuitema, DO [facility], [facility] 

## 2021-03-05 NOTE — Progress Notes (Signed)
Subjective:    Patient ID: Lucas Caldwell, male    DOB: 26-May-1933, 87 y.o.   MRN: [id number]  Lucas Caldwell is a 87 y.o. male presenting on 03/05/2021 for Hospitalization Follow-up  Virtual / Telehealth Encounter - Telephone Visit The purpose of this virtual visit is to provide medical care while limiting exposure to the novel coronavirus (COVID19) for both patient and office staff.  Consent was obtained for remote visit:  Yes.   Answered questions that patient had about telehealth interaction:  Yes.   I discussed the limitations, risks, security and privacy concerns of performing an evaluation and management service by video/telephone. I also discussed with the patient that there may be a patient responsible charge related to this service. The patient expressed understanding and agreed to proceed.  Patient Location: Home Provider Location: [facility] (Office)  Participants in virtual visit: - Patient: Lucas Caldwell, [place]: Orinda Kenner, CMA - Provider: Dr Parks Ranger   [facility] FOLLOW-UP VISIT  Hospital/Location: [facility] Date of Admission: 02/21/21 Date of Discharge: 02/25/21 Transitions of care telephone call: Not completed  Reason for Admission: 02/21/21 Primary (+Secondary) Diagnosis: Diabetic Toe Infection  - Hospital H&P and Discharge Summary have been reviewed - Patient presents today 8 days  after recent hospitalization. Brief summary of recent course, patient had symptoms of R Great Toe infection, hospitalized, given antibiotics ultimately treated with R great toe amputation. - Today reports overall has done well after discharge. Symptoms of R great toe infection have improved. S/p amputation. No further infection. He was held on diuretics for now, no edema reported. He has been held on insulin but asking to restart  I have reviewed the discharge medication list, and have reconciled the current and discharge medications  today.   Current Outpatient Medications:    acetaminophen (TYLENOL) 500 MG tablet, Take 1,000 mg by mouth every 6 (six) hours as needed for moderate pain or headache. , Disp: , Rfl:    Alcohol Swabs (B-D SINGLE USE SWABS REGULAR) PADS, Use to check blood sugar up to 2 times daily, Disp: 200 each, Rfl: 3   allopurinol (ZYLOPRIM) 100 MG tablet, TAKE ONE TABLET BY MOUTH ONCE DAILY (Patient taking differently: Take 100 mg by mouth daily.), Disp: 90 tablet, Rfl: 0   aspirin 81 MG chewable tablet, Chew 81 mg by mouth daily., Disp: , Rfl:    Blood Glucose Monitoring Suppl (TRUE METRIX METER) DEVI, Use to check blood sugar up to 2 times daily, Disp: 1 each, Rfl: 0   bosutinib (BOSULIF) 100 MG tablet, Take 100 mg by mouth at bedtime. Take with food., Disp: , Rfl:    Cholecalciferol 25 MCG (1000 UT) capsule, Take 1,000 Units by mouth daily., Disp: , Rfl:    gabapentin (NEURONTIN) 300 MG capsule, Take 300 mg by mouth 3 (three) times daily., Disp: , Rfl:    Insulin Pen Needle 31G X 5 MM MISC, 31 g by Does not apply route as directed., Disp: 100 each, Rfl: 5   isosorbide mononitrate (IMDUR) 60 MG 24 hr tablet, Take 0.5 tablets (30 mg total) by mouth daily., Disp: 90 tablet, Rfl: 0   JARDIANCE 10 MG TABS tablet, Take 1 tablet (10 mg total) by mouth daily with breakfast., Disp: 90 tablet, Rfl: 1   levothyroxine (SYNTHROID) 25 MCG tablet, TAKE ONE TABLET BY MOUTH EVERY MORNING BEFORE BREAKFAST (Patient taking differently: Take 25 mcg by mouth daily before breakfast.), Disp: 90 tablet, Rfl: 1  losartan (COZAAR) 25 MG tablet, Take 12.5 mg by mouth daily., Disp: , Rfl:    Menthol-Zinc Oxide (GOLD BOND EX), Apply 1 application topically daily as needed (muscle pain)., Disp: , Rfl:    Multiple Vitamin (MULTIVITAMIN WITH MINERALS) TABS tablet, Take 1 tablet by mouth daily., Disp: , Rfl:    nitroGLYCERIN (NITROSTAT) 0.4 MG SL tablet, Place 1 tablet (0.4 mg total) under the tongue every 5 (five) minutes x 3 doses as  needed for chest pain., Disp: 25 tablet, Rfl: 1   omeprazole (PRILOSEC) 20 MG capsule, Take 20 mg by mouth in the morning. , Disp: , Rfl:    Polyethyl Glycol-Propyl Glycol (SYSTANE OP), Place 1 drop into both eyes 3 (three) times daily as needed (dry/irritated eyes.). , Disp: , Rfl:    polyethylene glycol (MIRALAX / GLYCOLAX) 17 g packet, Take 17 g by mouth daily as needed for mild constipation. , Disp: , Rfl:    ranolazine (RANEXA) 500 MG 12 hr tablet, TAKE ONE TABLET BY MOUTH TWICE DAILY (Patient taking differently: Take 500 mg by mouth 2 (two) times daily.), Disp: 180 tablet, Rfl: 2   rosuvastatin (CRESTOR) 5 MG tablet, TAKE ONE TABLET BY MOUTH ONCE DAILY (Patient taking differently: Take 5 mg by mouth daily.), Disp: 90 tablet, Rfl: 0   tamsulosin (FLOMAX) 0.4 MG CAPS capsule, Take 1 capsule (0.4 mg total) by mouth daily after supper., Disp: 30 capsule, Rfl: 11   traMADol (ULTRAM) 50 MG tablet, Take 1-2 tablets (50-100 mg total) by mouth at bedtime as needed., Disp: 30 tablet, Rfl: 2   TRULICITY 1.5 WY/6.3ZC SOPN, Inject 1.5 mg into the skin once a week., Disp: 6 mL, Rfl: 3   warfarin (COUMADIN) 3 MG tablet, TAKE 1 TO 1 & 1/2 TABLETS BY MOUTH AS DIRECTED BY THE COUMADIN CLINIC, Disp: 90 tablet, Rfl: 0  ------------------------------------------------------------------------- Social History   Tobacco Use   Smoking status: Former    Packs/day: 1.75    Years: 20.00    Pack years: 35.00    Types: Cigarettes    Start date: 1955    Quit date: 1975    Years since quitting: 47.9   Smokeless tobacco: Former  Scientific laboratory technician Use: Never used  Substance Use Topics   Alcohol use: No    Comment: previously drank heavily - quit 1979.   Drug use: No    Review of Systems Per HPI unless specifically indicated above     Objective:    Wt 197 lb (89.4 kg)    BMI 34.90 kg/m   Wt Readings from Last 3 Encounters:  03/05/21 197 lb (89.4 kg)  02/23/21 197 lb 1.5 oz (89.4 kg)  02/14/21 197 lb  (89.4 kg)    Physical Exam  Virtual visit on telephone.  Results for orders placed or performed in visit on 02/28/21  POCT INR  Result Value Ref Range   INR 1.4 (A) 2.0 - 3.0   *Note: Due to a large number of results and/or encounters for the requested time period, some results have not been displayed. A complete set of results can be found in Results Review.      Assessment & Plan:   Problem List Items Addressed This Visit     Amputated great toe, right ([place])   Other Visit Diagnoses     Type 2 diabetes mellitus with diabetic toe ulcer ([place])    -  Primary       R Great Toe diabetic ulcer infection  S/p hospitalization w antibiotics and R Great toe amputation Resolved infection Improving post op per Podiatry [facility] Dr Vickki Muff, will return this week for stitch removal May hold Torsemide / K Supplement due to no edema Resume [name] / Trulicity Continue monitor CBG F/u with me 03/14/21  No orders of the defined types were placed in this encounter.   Follow up plan: Return if symptoms worsen or fail to improve.  Follow-up: - Return in PRN  Patient verbalizes understanding with the above medical recommendations including the limitation of remote medical advice.  Specific follow-up and call-back criteria were given for patient to follow-up or seek medical care more urgently if needed.  Total duration of direct patient care provided via telephone: 12 minutes   [name], [facility] 03/05/2021, 1:19 PM

## 2021-03-06 ENCOUNTER — Ambulatory Visit: Payer: Medicare HMO

## 2021-03-08 DIAGNOSIS — L97514 Non-pressure chronic ulcer of other part of right foot with necrosis of bone: Secondary | ICD-10-CM | POA: Diagnosis not present

## 2021-03-08 DIAGNOSIS — Z89411 Acquired absence of right great toe: Secondary | ICD-10-CM | POA: Diagnosis not present

## 2021-03-08 DIAGNOSIS — M86171 Other acute osteomyelitis, right ankle and foot: Secondary | ICD-10-CM | POA: Diagnosis not present

## 2021-03-08 DIAGNOSIS — L03031 Cellulitis of right toe: Secondary | ICD-10-CM | POA: Diagnosis not present

## 2021-03-14 ENCOUNTER — Telehealth: Payer: Self-pay | Admitting: Family Medicine

## 2021-03-14 ENCOUNTER — Ambulatory Visit: Payer: Medicare HMO | Admitting: Family Medicine

## 2021-03-14 ENCOUNTER — Telehealth: Payer: Self-pay | Admitting: Pharmacy Technician

## 2021-03-14 DIAGNOSIS — Z596 Low income: Secondary | ICD-10-CM

## 2021-03-14 NOTE — Telephone Encounter (Signed)
Okay to proceed. Thanks!  [name], [facility] 03/14/2021, 10:19 AM

## 2021-03-14 NOTE — Telephone Encounter (Signed)
Home Health Verbal Orders - Caller/Agency: Lemuel/ Santina Evans Number: [phone]/ vm can be left  Requesting PT Frequency: 1x a week for 4 weeks to work on strengthening, balance, gate training and home exercise program

## 2021-03-14 NOTE — Telephone Encounter (Signed)
Called [place] gave verbal order he verbalized understanding.  KP

## 2021-03-14 NOTE — Telephone Encounter (Signed)
Please review.  KP

## 2021-03-14 NOTE — Progress Notes (Signed)
[facility])                                            [place] Team    03/14/2021  SHERMAINE BRIGHAM 02-15-1934 [id number]  Care coordination call placed to [place] in regard to Trulicity and [place] application.  Spoke to [employer] who informs patient is APPROVED 03/18/21-03/17/22. She informs medication will begin to ship in 2023 based on when patient last received a refill in 2022. Medication will be delivered to his home.  Claudene Gatliff P. Hikeem Andersson, [place]  [phone]

## 2021-03-20 ENCOUNTER — Ambulatory Visit (HOSPITAL_COMMUNITY)
Admission: RE | Admit: 2021-03-20 | Discharge: 2021-03-20 | Disposition: A | Discharge status: Home / Self Care | Payer: Medicare HMO | Source: Ambulatory Visit | Attending: Adult Health | Admitting: Adult Health

## 2021-03-20 ENCOUNTER — Other Ambulatory Visit: Payer: Self-pay

## 2021-03-20 DIAGNOSIS — I5022 Chronic systolic (congestive) heart failure: Secondary | ICD-10-CM

## 2021-03-20 DIAGNOSIS — M1990 Unspecified osteoarthritis, unspecified site: Secondary | ICD-10-CM | POA: Diagnosis not present

## 2021-03-20 DIAGNOSIS — I4510 Unspecified right bundle-branch block: Secondary | ICD-10-CM | POA: Diagnosis not present

## 2021-03-20 DIAGNOSIS — D509 Iron deficiency anemia, unspecified: Secondary | ICD-10-CM | POA: Diagnosis not present

## 2021-03-20 DIAGNOSIS — E1169 Type 2 diabetes mellitus with other specified complication: Secondary | ICD-10-CM | POA: Diagnosis not present

## 2021-03-20 DIAGNOSIS — N184 Chronic kidney disease, stage 4 (severe): Secondary | ICD-10-CM | POA: Diagnosis not present

## 2021-03-20 DIAGNOSIS — H5461 Unqualified visual loss, right eye, normal vision left eye: Secondary | ICD-10-CM | POA: Diagnosis not present

## 2021-03-20 DIAGNOSIS — I951 Orthostatic hypotension: Secondary | ICD-10-CM | POA: Diagnosis not present

## 2021-03-20 DIAGNOSIS — I25118 Atherosclerotic heart disease of native coronary artery with other forms of angina pectoris: Secondary | ICD-10-CM | POA: Diagnosis not present

## 2021-03-20 DIAGNOSIS — Z4781 Encounter for orthopedic aftercare following surgical amputation: Secondary | ICD-10-CM | POA: Diagnosis not present

## 2021-03-20 DIAGNOSIS — J432 Centrilobular emphysema: Secondary | ICD-10-CM | POA: Diagnosis not present

## 2021-03-20 DIAGNOSIS — I480 Paroxysmal atrial fibrillation: Secondary | ICD-10-CM | POA: Diagnosis not present

## 2021-03-20 DIAGNOSIS — E1122 Type 2 diabetes mellitus with diabetic chronic kidney disease: Secondary | ICD-10-CM | POA: Diagnosis not present

## 2021-03-20 DIAGNOSIS — E871 Hypo-osmolality and hyponatremia: Secondary | ICD-10-CM | POA: Diagnosis not present

## 2021-03-20 DIAGNOSIS — C9210 Chronic myeloid leukemia, BCR/ABL-positive, not having achieved remission: Secondary | ICD-10-CM | POA: Diagnosis not present

## 2021-03-20 DIAGNOSIS — I5032 Chronic diastolic (congestive) heart failure: Secondary | ICD-10-CM

## 2021-03-20 DIAGNOSIS — Z89411 Acquired absence of right great toe: Secondary | ICD-10-CM | POA: Diagnosis not present

## 2021-03-20 DIAGNOSIS — G43909 Migraine, unspecified, not intractable, without status migrainosus: Secondary | ICD-10-CM | POA: Diagnosis not present

## 2021-03-20 DIAGNOSIS — D630 Anemia in neoplastic disease: Secondary | ICD-10-CM | POA: Diagnosis not present

## 2021-03-20 DIAGNOSIS — I255 Ischemic cardiomyopathy: Secondary | ICD-10-CM | POA: Diagnosis not present

## 2021-03-20 DIAGNOSIS — M86071 Acute hematogenous osteomyelitis, right ankle and foot: Secondary | ICD-10-CM | POA: Diagnosis not present

## 2021-03-20 DIAGNOSIS — G4733 Obstructive sleep apnea (adult) (pediatric): Secondary | ICD-10-CM | POA: Diagnosis not present

## 2021-03-20 DIAGNOSIS — I130 Hypertensive heart and chronic kidney disease with heart failure and stage 1 through stage 4 chronic kidney disease, or unspecified chronic kidney disease: Secondary | ICD-10-CM | POA: Diagnosis not present

## 2021-03-20 DIAGNOSIS — N400 Enlarged prostate without lower urinary tract symptoms: Secondary | ICD-10-CM | POA: Diagnosis not present

## 2021-03-20 DIAGNOSIS — D631 Anemia in chronic kidney disease: Secondary | ICD-10-CM | POA: Diagnosis not present

## 2021-03-20 DIAGNOSIS — M1030 Gout due to renal impairment, unspecified site: Secondary | ICD-10-CM | POA: Diagnosis not present

## 2021-03-20 DIAGNOSIS — I5042 Chronic combined systolic (congestive) and diastolic (congestive) heart failure: Secondary | ICD-10-CM | POA: Diagnosis not present

## 2021-03-20 NOTE — Progress Notes (Signed)
Cardiomems Remote Monitoring  S/P Cardiomems Implant 09/23/2019   I have reviewed weekly PAD reading from cardiomems device.   PAD Goal 15. Todays reading is 19.   Please call and ask him to start torsemide 40  mg daily. BMET in 7 days.    Follow monthly readings. Please call.   Branston Halsted NP-C  3:58 PM

## 2021-03-20 NOTE — Progress Notes (Signed)
Attempted to contact pt No answer- unable to leave message voicemail full

## 2021-03-20 NOTE — Addendum Note (Signed)
Encounter addended by: Kerry Dory, CMA on: 03/20/2021 5:12 PM  Actions taken: Clinical Note Signed

## 2021-03-21 ENCOUNTER — Ambulatory Visit (INDEPENDENT_AMBULATORY_CARE_PROVIDER_SITE_OTHER): Payer: Medicare HMO

## 2021-03-21 ENCOUNTER — Ambulatory Visit: Payer: Self-pay | Admitting: *Deleted

## 2021-03-21 DIAGNOSIS — C9210 Chronic myeloid leukemia, BCR/ABL-positive, not having achieved remission: Principal | ICD-10-CM

## 2021-03-21 DIAGNOSIS — Z794 Long term (current) use of insulin: Secondary | ICD-10-CM

## 2021-03-21 DIAGNOSIS — S98111A Complete traumatic amputation of right great toe, initial encounter: Secondary | ICD-10-CM

## 2021-03-21 DIAGNOSIS — I25118 Atherosclerotic heart disease of native coronary artery with other forms of angina pectoris: Secondary | ICD-10-CM

## 2021-03-21 DIAGNOSIS — E785 Hyperlipidemia, unspecified: Secondary | ICD-10-CM

## 2021-03-21 DIAGNOSIS — M25512 Pain in left shoulder: Secondary | ICD-10-CM

## 2021-03-21 DIAGNOSIS — K219 Gastro-esophageal reflux disease without esophagitis: Secondary | ICD-10-CM

## 2021-03-21 DIAGNOSIS — N1831 Chronic kidney disease, stage 3a: Secondary | ICD-10-CM

## 2021-03-21 DIAGNOSIS — E11621 Type 2 diabetes mellitus with foot ulcer: Secondary | ICD-10-CM

## 2021-03-21 DIAGNOSIS — L97509 Non-pressure chronic ulcer of other part of unspecified foot with unspecified severity: Secondary | ICD-10-CM

## 2021-03-21 DIAGNOSIS — J432 Centrilobular emphysema: Secondary | ICD-10-CM

## 2021-03-21 DIAGNOSIS — I5032 Chronic diastolic (congestive) heart failure: Secondary | ICD-10-CM

## 2021-03-21 DIAGNOSIS — I10 Essential (primary) hypertension: Secondary | ICD-10-CM

## 2021-03-21 DIAGNOSIS — I5023 Acute on chronic systolic (congestive) heart failure: Secondary | ICD-10-CM

## 2021-03-21 DIAGNOSIS — G8929 Other chronic pain: Secondary | ICD-10-CM

## 2021-03-21 DIAGNOSIS — E1169 Type 2 diabetes mellitus with other specified complication: Secondary | ICD-10-CM

## 2021-03-21 MED ORDER — TORSEMIDE 20 MG PO TABS: 40.0000 mg | ORAL_TABLET | Freq: Every day | ORAL | 3 refills | Status: DC

## 2021-03-21 NOTE — Addendum Note (Signed)
Encounter addended by: Kerry Dory, CMA on: 03/21/2021 1:03 PM  Actions taken: Pharmacy for encounter modified, Order list changed, Diagnosis association updated, Clinical Note Signed

## 2021-03-21 NOTE — Progress Notes (Signed)
Pt aware via wife reports understanding Repeat labs 03/30/21

## 2021-03-21 NOTE — Telephone Encounter (Signed)
Caller states patient is experiencing gas, belching, diarrhea and unable to sleep due to symptoms. Offered caller appointment for Friday and declined stating patient needs to be seen sooner. Caller seeking clinical advice   CAlled pt to triage, spoke to wife. States they had already spoken to a nurse "Pam" who spoke to PCP. See encounter with Chronic Care Management.  States "We got it taken care of."      Reason for Disposition  Caller has already spoken with another triager and has no further questions.  Answer Assessment - Initial Assessment Questions 1. DIARRHEA SEVERITY: "How bad is the diarrhea?" "How many more stools have you had in the past 24 hours than normal?"    - NO DIARRHEA (SCALE 0)   - MILD (SCALE 1-3): Few loose or mushy BMs; increase of 1-3 stools over normal daily number of stools; mild increase in ostomy output.   -  MODERATE (SCALE 4-7): Increase of 4-6 stools daily over normal; moderate increase in ostomy output. * SEVERE (SCALE 8-10; OR 'WORST POSSIBLE'): Increase of 7 or more stools daily over normal; moderate increase in ostomy output; incontinence.     4-5 times 2. ONSET: "When did the diarrhea begin?"      2 days ago 3. BM CONSISTENCY: "How loose or watery is the diarrhea?"      *No Answer* 4. VOMITING: "Are you also vomiting?" If Yes, ask: "How many times in the past 24 hours?"      No 5. ABDOMINAL PAIN: "Are you having any abdominal pain?" If Yes, ask: "What does it feel like?" (e.g., crampy, dull, intermittent, constant)      no 6. ABDOMINAL PAIN SEVERITY: If present, ask: "How bad is the pain?"  (e.g., Scale 1-10; mild, moderate, or severe)   - MILD (1-3): doesn't interfere with normal activities, abdomen soft and not tender to touch    - MODERATE (4-7): interferes with normal activities or awakens from sleep, abdomen tender to touch    - SEVERE (8-10): excruciating pain, doubled over, unable to do any normal activities      NA 7. ORAL INTAKE: If  vomiting, "Have you been able to drink liquids?" "How much liquids have you had in the past 24 hours?"      8. HYDRATION: "Any signs of dehydration?" (e.g., dry mouth [not just dry lips], too weak to stand, dizziness, new weight loss) "When did you last urinate?"      10. ANTIBIOTIC USE: "Are you taking antibiotics now or have you taken antibiotics in the past 2 months?"        11. OTHER SYMPTOMS: "Do you have any other symptoms?" (e.g., fever, blood in stool)  Protocols used: Diarrhea-A-AH, No Contact or Duplicate Contact Call-A-AH

## 2021-03-21 NOTE — Patient Instructions (Signed)
Visit Information  Thank you for taking time to visit with me today. Please don't hesitate to contact me if I can be of assistance to you before our next scheduled telephone appointment.  Following are the goals we discussed today:  RNCM Clinical Goal(s):  Patient will verbalize understanding of plan for management of CHF, CAD, HTN, HLD, COPD, DMII, CKD Stage 3, and Osteoarthritis as evidenced by understanding the plan of care, calling the provider for changes, and working with the CCM to optimize health and well being for effective management of chronic conditions take all medications exactly as prescribed and will call provider for medication related questions as evidenced by taking medications as prescribed, contacting providers for questions about medications, and working with the pharm D to manage polypharmacy and medications concerns     attend all scheduled medical appointments: Saw pcp on 03-05-2021. The patients wife calls the office for changes and recommendations as evidenced by by keeping appointments with pcp and other specialist and calling for new appointments needed or questions        demonstrate improved and ongoing adherence to prescribed treatment plan for CHF, CAD, HTN, HLD, COPD, DMII, CKD Stage 3, and Osteoarthritis as evidenced by working with the CCM team to optimize health and well being  demonstrate a decrease in CHF, CAD, HTN, HLD, COPD, DMII, CKD Stage 3, and Osteoarthritis exacerbations  as evidenced by calling the provider for changes in conditions, stable chronic conditions and working with the CCM team to effectively manage health and well being  demonstrate ongoing self health care management ability for effective management of chronic conditions  as evidenced by  working with the CCM team  through collaboration with Consulting civil engineer, provider, and care team.    Interventions: 1:1 collaboration with primary care provider regarding development and update of comprehensive  plan of care as evidenced by provider attestation and co-signature Inter-disciplinary care team collaboration (see longitudinal plan of care) Evaluation of current treatment plan related to  self management and patient's adherence to plan as established by provider     SDOH Barriers (Status: Goal on Track (progressing): YES.) Long Term Goal  Patient interviewed and SDOH assessment performed        Patient interviewed and appropriate assessments performed Provided patient with information about resources in [facility] and care guides available for assistance with changes in SDOH, questions or concerns Discussed plans with patient for ongoing care management follow up and provided patient with direct contact information for care management team Advised patient to call the office for changes in [place], new concerns or questions  Provided education to patient/caregiver regarding level of care options.       CAD  (Status: Goal on Track (progressing): YES.) Long Term Goal  Assessed understanding of CAD diagnosis Medications reviewed including medications utilized in CAD treatment plan Provided education on importance of blood pressure control in management of CAD; Provided education on Importance of limiting foods high in cholesterol. 03-21-2021: Review of dietary intake and patient is compliant with heart healthy/ADA diet Counseled on importance of regular laboratory monitoring as prescribed; Reviewed Importance of taking all medications as prescribed. 03-21-2021: Review of medications and patient is compliant with medications. Denies any medication needs or changes Reviewed Importance of attending all scheduled provider appointments. 03-21-2021: [place] appointments, calls for changes and needs Advised to report any changes in symptoms or exercise tolerance Screening for signs and symptoms of depression related to chronic disease state;  Assessed social determinant of health barriers;  Heart  Failure Interventions:  (Status: Goal on Track (progressing): YES.)  Long Term Goal  Basic overview and discussion of pathophysiology of Heart Failure reviewed; Provided education on low sodium diet. 03-21-2021: The patient is compliant with heart healthy/ADA diet. Is not feeling the best today because of some diarrhea but wife states he is eating and drinking well; Reviewed Heart Failure Action Plan in depth and provided written copy; Assessed need for readable accurate scales in home. 03-21-2021: Participates in the HF program and weighs daily on a provided mat that send readings into the HF clinic for review and recommendations is any. Provided education about placing scale on hard, flat surface; Advised patient to weigh each morning after emptying bladder; Discussed importance of daily weight and advised patient to weigh and record daily; Reviewed role of diuretics in prevention of fluid overload and management of heart failure; Discussed the importance of keeping all appointments with provider; Provided patient with education about the role of exercise in the management of heart failure; Advised patient to discuss torsemide dose  with provider.  01-22-2021: The patients wife states that she has not heard back from the heart failure clinic on the Torsemide dose. Advised the patients wife to call the HF clinic back and request clarification on Torsemide dose. The patients wife verbalized understanding. She states he is not having any issues with swelling but he is having a problem with "holding his water" and its been going on for a while. 01-24-2021: The pharm D is also assisting with getting in touch with the cardiologist/HF clinic to get clarification of medication dosage. 03-21-2021: The patient is taking medications as directed. Denies any issues with medication compliance.    Chronic Kidney Disease (Status: Goal on Track (progressing): YES.)  Long Term Goal  Last practice recorded BP readings:      BP Readings from Last 3 Encounters:  02/24/21 124/66  02/14/21 110/62  02/01/21 110/64  Most recent eGFR/CrCl:       Lab Results  Component Value Date    EGFR 44 (L) 06/29/2020    No components found for: CRCL   Assessed the patient   and patients wife for   understanding of chronic kidney disease    Evaluation of current treatment plan related to chronic kidney disease self management and patient's adherence to plan as established by provider . 01-22-2021: The patients wife states the patient is having issues with "holding his water". States the urologist gave him a pill but it does not help. The patient is taking myrbetriq but the patients wife says he cannot tell where it helps him. Advised the patients wife to follow up with urologist, will also collaborate with the pcp for any other additional recommendations. Advised the patients wife with the multiple medications he takes and his chronic conditions that this is something that may be a little harder to manage.  03-21-2021: The patient is doing well with management of his CKD. The patients wife states there are no new concerns at this time with kidney function.   Provided education to patient re: stroke prevention, s/s of heart attack and stroke    Reviewed prescribed diet heart healthy/ADA diet  Reviewed medications with patient and discussed importance of compliance    Advised patient, providing education and rationale, to monitor blood pressure daily and record, calling PCP for findings outside established parameters    Discussed complications of poorly controlled blood pressure such as heart disease, stroke, circulatory complications, vision complications, kidney impairment, sexual dysfunction  Advised patient to discuss urinary incontinence and frequency  with provider    Discussed plans with patient for ongoing care management follow up and provided patient with direct contact information for care management team    Discussed the  impact of chronic kidney disease on daily life and mental health and acknowledged and normalized feelings of disempowerment, fear, and frustration    Engage patient in early, proactive and ongoing discussion about goals of care and what matters most to them        COPD: (Status: Goal on Track (progressing): YES.) Long Term Goal  Reviewed medications with patient, including use of prescribed maintenance and rescue inhalers, and provided instruction on medication management and the importance of adherence. 03-21-2021: Is compliant with medications Provided patient with basic written and verbal COPD education on self care/management/and exacerbation prevention; Advised patient to track and manage COPD triggers. 03-21-2021: The patient and patients wife are aware of triggers that cause COPD exacerbation. The patient has some GI symptoms going on but is not having any respiratory sx and sx of infection. Education and support given;  Provided written and verbal instructions on pursed lip breathing and utilized returned demonstration as teach back; Provided instruction about proper use of medications used for management of COPD including inhalers; Advised patient to self assesses COPD action plan zone and make appointment with provider if in the yellow zone for 48 hours without improvement; Advised patient to engage in light exercise as tolerated 3-5 days a week to aid in the the management of COPD; Provided education about and advised patient to utilize infection prevention strategies to reduce risk of respiratory infection; Discussed the importance of adequate rest and management of fatigue with COPD;   Diabetes:  (Status: Goal on Track (progressing): YES.) Long Term Goal         Lab Results  Component Value Date    HGBA1C 7.3 (H) 02/21/2021  Assessed patient's understanding of A1c goal: <7% Provided education to patient about basic DM disease process; Reviewed medications with patient and discussed  importance of medication adherence. 03-21-2021: The patients wife states that the patient is compliant with his medications. Denies any concerns with medications.        Reviewed prescribed diet with patient heart healthy/ADA. 03-21-2021: Review of dietary restrictions and monitoring for foods high in sugars, sodium, and fats.  Counseled on importance of regular laboratory monitoring as prescribed. 03-21-2021: The patient has regular lab work for evaluation of A1C and glucose;        Discussed plans with patient for ongoing care management follow up and provided patient with direct contact information for care management team;      Provided patient with written educational materials related to hypo and hyperglycemia and importance of correct treatment. 03-21-2021: Denies any lows at this time. ;       Reviewed scheduled/upcoming provider appointments including: Saw pcp on 03-05-2021. Knows to call for changes ;         Advised patient, providing education and rationale, to check cbg as directed  and record. 03-21-2021: The patients wife states his blood sugars this am were 110. Range for blood sugar readings have been 110 to 130. Review of goal of fasting blood sugars of <130 and post prandial of <180.        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 He has PT/OT and a nurse coming in the home to evaluate and monitor. The patient is walking with his walker and the patients wife denies any falls or safety concerns. The patient has a follow up with the provider on 04-10-2021. Education on sx and sx of infection and to call the office for changes at surgical  site, questions or concerns.     Hyperlipidemia:  (Status: Goal on Track (progressing): YES.)      Lab Results  Component Value Date    CHOL 123 06/29/2020    HDL 29 (L) 06/29/2020    LDLCALC 65 06/29/2020    TRIG 170 (H) 06/29/2020    CHOLHDL 4.2 06/29/2020      Medication review performed; medication list updated in electronic medical record.  Provider established cholesterol goals reviewed. 03-21-2021: Review of labs and the patient is compliant with regular lab work needs; Counseled on importance of regular laboratory monitoring as prescribed; Provided HLD educational materials; Reviewed role and benefits of statin for ASCVD risk reduction; Discussed strategies to manage statin-induced myalgias; Reviewed importance of limiting foods high in cholesterol. 03-21-2021: Review of heart healthy/ADA diet and foods to limit Reviewed exercise goals and target of 150 minutes per week;   Hypertension: (Status: Goal on Track (progressing): YES.) Last practice recorded BP readings:     BP Readings from Last 3 Encounters:  02/24/21 124/66  02/14/21 110/62  02/01/21 110/64  Most recent eGFR/CrCl:       Lab Results  Component Value Date    EGFR 44 (L) 06/29/2020    No components found for: CRCL   Evaluation of current treatment plan related to hypertension self management and patient's adherence to plan as established by provider. 01-24-2021: The patient was eating breakfast. Reviewed with wife several upcoming appointments and follow ups scheduled.   03-21-2021: The patient is having more normal blood pressure readings. The patient denies any issues with HTN or heart health. Has a lot of chronic conditions impacting his care. Will continue to monitor for changes.  Provided education to patient re: stroke prevention, s/s of heart attack and stroke; Reviewed prescribed diet heart healthy/ADA Reviewed medications with patient and discussed importance of compliance;  Discussed plans with patient  for ongoing care management follow up and provided patient with direct contact information for care management team; Advised patient, providing education and rationale, to monitor blood pressure daily and record, calling PCP for findings outside established parameters;  Advised patient to discuss blood pressure trends  with provider; Provided education on prescribed diet heart healthy/ADA diet ;  Discussed complications of poorly controlled blood pressure such as heart disease, stroke, circulatory complications, vision complications, kidney impairment, sexual dysfunction;    Pain:  (Status: Goal on Track (progressing): YES.) Long Term Goal  Pain assessment performed. 03-24-2020: the patient rates his pain in his bilateral shoulders at an 8 today on a scale of 0-10.  The patient states that he cannot raise his arms but so high. The patient is not taking any medications for pain relief. Has not tried heat or cold applications. The patient states the injections he gets at the provider office usually help but they have not this time. The patient states that he is open to recommendations. 03-21-2021: The patient denies any pain today. The patient  has upcoming imaging to evaluate shoulders.  Medications reviewed. 01-22-2021: The patient is currently not taking any medications for pain relief. Does have gabapentin that he takes on a consistent basis for neuropathy pain. Dosage in question concerning this as the neurologist has increased dose to 800 mg and renal dose recommends dose not to exceed 600 mg BID. Education with wife concerning medication changes. 03-21-2021: Is compliant with medications regimen.  Reviewed provider established plan for pain management. 01-22-2021: The patient and the patients wife ask if physical therapy would help with shoulder pain and discomfort. Will collaborate with the pcp on recommendations and let the patient and the patients wife know. 01-24-2021: Call made to [facility] on  01-23-2021 to ask about referral from pcp office. The staff states the referral was received and the patient has an appointment With Dr. Eliberto Ivory at 10 am on 02-14-2021.  Attempted to reach the patient or patients wife but was unsuccessful. Returned call today and explained to the wife the orthopedic appointment would address the patients shoulder pain and evaluate and recommend further treatment. Used teach back method for understanding of upcoming appointment with the orthopedic provider. Will continue to monitor. 03-21-2021: The patient is following the recommendations of the provider. The patient is seen on a consistent basis for evaluation and treatment options. Discussed importance of adherence to all scheduled medical appointments; Counseled on the importance of reporting any/all new or changed pain symptoms or management strategies to pain management provider; Advised patient to report to care team affect of pain on daily activities; Discussed use of relaxation techniques and/or diversional activities to assist with pain reduction (distraction, imagery, relaxation, massage, acupressure, TENS, heat, and cold application; Reviewed with patient prescribed pharmacological and nonpharmacological pain relief strategies; Advised patient to discuss unresolved pain, changes in level and intensity of pain with provider;     GERD/Acid reflux  (Status: New goal.) Long Term Goal  Evaluation of current treatment plan related to GERD,  self-management and patient's adherence to plan as established by provider. 03-21-2021: During outreach today the patients wife states the patient for the last 3 days has had a "sour burp" and not feeling the best with episodes of diarrhea x 3 to 4 times during the night. Denies fever or chills. Is tolerating food and beverages without difficulty. Secure message sent to pcp and CMA for recommendations. Call made back to the wife with instructions from Dr. Parks Ranger Discussed plans with  patient for ongoing care management follow up and provided patient with direct contact information for care management team Advised patient to call the office if symptoms of GERD/ACID reflux do not improve with recommended changes; Provided education to patient re: for the patient at the direction of pcp to increase his omeprazole dose from 20 mg daily to 40 mg daily. Used the teach back method to make sure the patients wife understood the instructions given. The patients wife wrote down instructions and repeated instructions back to [facility]; Reviewed medications with patient and discussed 03-21-2021: Advised the patients wife to only use imodium for severe diarrhea. Also advised per instructions from Dr. Parks Ranger that is was okay to take gas-x and pepto bismol as needed. Also to increase Omeprazole dose from 74m daily to 40 mg daily; Collaborated with PCP, CMA, and pharm D regarding new complaint of GI symptoms. Recommendations received and the wife called back with instructions. ; Provided patient with instructions from pcp for new onset of GI symptoms x 3 days educational materials related to new onset  of GI symptoms; Pharmacy referral for collaboration, ongoing support, and education for medications reconciliation and management ; Discussed plans with patient for ongoing care management follow up and provided patient with direct contact information for care management team; Advised patient to discuss unresolved GI condition with recommended changes, questions, concerns and sx and sx of worsening condition with provider;    Patient Goals/Self-Care Activities: Patient will self administer medications as prescribed as evidenced by self report/primary caregiver report  Patient will attend all scheduled provider appointments as evidenced by clinician review of documented attendance to scheduled appointments and patient/caregiver report Patient will call pharmacy for medication refills as evidenced by  patient report and review of pharmacy fill history as appropriate Patient will attend church or other social activities as evidenced by patient report Patient will continue to perform ADL's independently as evidenced by patient/caregiver report Patient will continue to perform IADL's independently as evidenced by patient/caregiver report Patient will call provider office for new concerns or questions as evidenced by review of documented incoming telephone call notes and patient report Patient will work with BSW to address care coordination needs and will continue to work with the clinical team to address health care and disease management related needs as evidenced by documented adherence to scheduled care management/care coordination appointments - call office if I gain more than 2 pounds in one day or 5 pounds in one week - keep legs up while sitting - track weight in diary - use salt in moderation - watch for swelling in feet, ankles and legs every day - weigh myself daily - develop a rescue plan - follow rescue plan if symptoms flare-up - know when to call the doctor:for changes in HF sx and sx - track symptoms and what helps feel better or worse - dress right for the weather, hot or cold - schedule appointment with eye doctor - check blood sugar at prescribed times: before meals and at bedtime, when you have symptoms of low or high blood sugar, and before and after exercise - check feet daily for cuts, sores or redness - enter blood sugar readings and medication or insulin into daily log - take the blood sugar log to all doctor visits - trim toenails straight across - drink 6 to 8 glasses of water each day - eat fish at least once per week - fill half of plate with vegetables - limit fast food meals to no more than 1 per week - manage portion size - prepare main meal at home 3 to 5 days each week - read food labels for fat, fiber, carbohydrates and portion size - reduce red meat to  2 to 3 times a week - keep feet up while sitting - wash and dry feet carefully every day - wear comfortable, cotton socks - wear comfortable, well-fitting shoes Keep appointments with the podiatrist, next appointment on 01-24-2021 - avoid second hand smoke - eliminate smoking in my home - identify and remove indoor air pollutants - limit outdoor activity during cold weather - listen for public air quality announcements every day - do breathing exercises every day - develop a rescue plan - eliminate symptom triggers at home - follow rescue plan if symptoms flare-up - use an extra pillow to sleep - develop a new routine to improve sleep - don't eat or exercise right before bedtime - eat healthy/prescribed diet: heart healthy/ADA diet  - get at least 7 to 8 hours of sleep at night - use devices that will help  like a cane, sock-puller or reacher - practice relaxation or meditation daily - do exercises in a comfortable position that makes breathing as easy as possible - check blood pressure 3 times per week - choose a place to take my blood pressure (home, clinic or office, retail store) - write blood pressure results in a log or diary - learn about high blood pressure - keep a blood pressure log - take blood pressure log to all doctor appointments - call doctor for signs and symptoms of high blood pressure - develop an action plan for high blood pressure - keep all doctor appointments - take medications for blood pressure exactly as prescribed - report new symptoms to your doctor - eat more whole grains, fruits and vegetables, lean meats and healthy fats - call for medicine refill 2 or 3 days before it runs out - take all medications exactly as prescribed - call doctor with any symptoms you believe are related to your medicine - call doctor when you experience any new symptoms - go to all doctor appointments as scheduled - adhere to prescribed diet: heart healthy/ADA diet        Our next appointment is by telephone on 04-19-2021 at 0900 am  Please call the care guide team at [phone] if you need to cancel or reschedule your appointment.   If you are experiencing a Mental Health or [place] or need someone to talk to, please call the Suicide and Crisis Lifeline: 988 call the [place] National Suicide Prevention Lifeline: (469)596-2770 or TTY: 580-313-7632 TTY 912 115 6749) to talk to a trained counselor call 1-800-273-TALK (toll free, 24 hour hotline)   The patient verbalized understanding of instructions, educational materials, and care plan provided today and declined offer to receive copy of patient instructions, educational materials, and care plan.   Noreene Larsson RN, MSN, [place] Mobile: [phone]

## 2021-03-21 NOTE — Chronic Care Management (AMB) (Signed)
Chronic Care Management   CCM RN Visit Note  03/21/2021 Name: Lucas Caldwell MRN: [id number] DOB: 06-29-1933  Subjective: Lucas Caldwell is a 88 y.o. year old male who is a primary care patient of [name], DO. The care management team was consulted for assistance with disease management and care coordination needs.    Engaged with patient by telephone for follow up visit in response to provider referral for case management and/or care coordination services.   Consent to Services:  The patient was given information about Chronic Care Management services, agreed to services, and gave verbal consent prior to initiation of services.  Please see initial visit note for detailed documentation.   Patient agreed to services and verbal consent obtained.   Assessment: Review of patient past medical history, allergies, medications, health status, including review of consultants reports, laboratory and other test data, was performed as part of comprehensive evaluation and provision of chronic care management services.   SDOH (Social Determinants of Health) assessments and interventions performed:    CCM Care Plan  Allergies  Allergen Reactions   Clopidogrel Anaphylaxis and Other (See Comments)    altered mental status;  (electrolytes "out of wack" and confusion per family; THEY DO NOT REMEMBER ANY OTHER REACTION)- MSB 10/10/15   Ciprofloxacin Itching   Beta Adrenergic Blockers     Nightmares   Mirtazapine Diarrhea    Bad dreams    Spironolactone Other (See Comments)    gynecomastia    Outpatient Encounter Medications as of 03/21/2021  Medication Sig   acetaminophen (TYLENOL) 500 MG tablet Take 1,000 mg by mouth every 6 (six) hours as needed for moderate pain or headache.    Alcohol Swabs (B-D SINGLE USE SWABS REGULAR) PADS Use to check blood sugar up to 2 times daily   allopurinol (ZYLOPRIM) 100 MG tablet TAKE ONE TABLET BY MOUTH ONCE DAILY (Patient taking differently:  Take 100 mg by mouth daily.)   aspirin 81 MG chewable tablet Chew 81 mg by mouth daily.   Blood Glucose Monitoring Suppl (TRUE METRIX METER) DEVI Use to check blood sugar up to 2 times daily   bosutinib (BOSULIF) 100 MG tablet Take 100 mg by mouth at bedtime. Take with food.   Cholecalciferol 25 MCG (1000 UT) capsule Take 1,000 Units by mouth daily.   gabapentin (NEURONTIN) 300 MG capsule Take 300 mg by mouth 3 (three) times daily.   Insulin Pen Needle 31G X 5 MM MISC 31 g by Does not apply route as directed.   isosorbide mononitrate (IMDUR) 60 MG 24 hr tablet Take 0.5 tablets (30 mg total) by mouth daily.   JARDIANCE 10 MG TABS tablet Take 1 tablet (10 mg total) by mouth daily with breakfast.   levothyroxine (SYNTHROID) 25 MCG tablet TAKE ONE TABLET BY MOUTH EVERY MORNING BEFORE BREAKFAST (Patient taking differently: Take 25 mcg by mouth daily before breakfast.)   losartan (COZAAR) 25 MG tablet Take 12.5 mg by mouth daily.   Menthol-Zinc Oxide (GOLD BOND EX) Apply 1 application topically daily as needed (muscle pain).   Multiple Vitamin (MULTIVITAMIN WITH MINERALS) TABS tablet Take 1 tablet by mouth daily.   nitroGLYCERIN (NITROSTAT) 0.4 MG SL tablet Place 1 tablet (0.4 mg total) under the tongue every 5 (five) minutes x 3 doses as needed for chest pain.   omeprazole (PRILOSEC) 20 MG capsule Take 20 mg by mouth in the morning. Per Dr. Raliegh Ip instructed the patients wife to increase omeprazole to 40 mg daily (03-21-2021)  Polyethyl Glycol-Propyl Glycol (SYSTANE OP) Place 1 drop into both eyes 3 (three) times daily as needed (dry/irritated eyes.).    polyethylene glycol (MIRALAX / GLYCOLAX) 17 g packet Take 17 g by mouth daily as needed for mild constipation.    ranolazine (RANEXA) 500 MG 12 hr tablet TAKE ONE TABLET BY MOUTH TWICE DAILY (Patient taking differently: Take 500 mg by mouth 2 (two) times daily.)   rosuvastatin (CRESTOR) 5 MG tablet TAKE ONE TABLET BY MOUTH ONCE DAILY (Patient taking  differently: Take 5 mg by mouth daily.)   tamsulosin (FLOMAX) 0.4 MG CAPS capsule Take 1 capsule (0.4 mg total) by mouth daily after supper.   traMADol (ULTRAM) 50 MG tablet Take 1-2 tablets (50-100 mg total) by mouth at bedtime as needed.   TRULICITY 1.5 KG/8.1EH SOPN Inject 1.5 mg into the skin once a week.   warfarin (COUMADIN) 3 MG tablet TAKE 1 TO 1 & 1/2 TABLETS BY MOUTH AS DIRECTED BY THE COUMADIN CLINIC   No facility-administered encounter medications on file as of 03/21/2021.    Patient Active Problem List   Diagnosis Date Noted   Amputated great toe, right ([place]) 03/05/2021   Acute hematogenous osteomyelitis ([place])    Cellulitis of foot    Diabetic foot infection ([place])    Diabetic foot ulcer ([place]) 02/21/2021   Diabetic polyneuropathy associated with type 2 diabetes mellitus ([place]) 11/23/2020   Orthostatic hypotension 10/16/2020   Obstructive sleep apnea 06/29/2020   Urge incontinence of urine 12/22/2019   Trifascicular block 10/20/2019   Wound infection 10/08/2019   Bifascicular block 05/13/2019   Coronary stent patent 04/13/2019   PSVT (paroxysmal supraventricular tachycardia) ([place]) 03/19/2019   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 ([place]) 08/24/2018   CKD (chronic kidney disease), stage IV ([place]) 06/04/2018   Osteoarthritis of knees, bilateral 06/02/2018   Hyperlipidemia associated with type 2 diabetes mellitus ([place]) 05/06/2018   Pain of lower extremity 03/06/2018   Duodenitis 01/30/2018   Hematochezia    Melena    Rectal bleeding 01/11/2018   AVM (arteriovenous malformation) of colon without hemorrhage    Schatzki's ring    Esophageal dysphagia    Diabetic retinopathy ([place]) 07/08/2017   Ischemic cardiomyopathy 06/26/2017   Cardiomyopathy ([place]) 06/26/2017   Centrilobular emphysema ([place]) 06/13/2017   Fatigue 12/18/2016   Stable angina ([place]) 09/20/2016   Stage 3a chronic kidney disease ([place]) 09/20/2016   Paroxysmal atrial fibrillation ([place]) 08/28/2016    Chronic anticoagulation 08/28/2016   Essential hypertension 08/20/2016   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 heart with stable angina pectoris ([place]) 08/19/2016   Accelerating angina ([place]) 08/15/2016   Exertional dyspnea 08/12/2016   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[place]) 12/29/2015   Osteomyelitis of great toe of right foot ([place]) 10/03/2015   Ulcerated, foot, right, with necrosis of bone ([place]) 09/26/2015   Gynecomastia, male 03/31/2014   Hypertrophy of breast 03/31/2014   Nuclear sclerosis of left eye 10/22/2013   Enthesopathy of ankle and tarsus 04/10/2013   Controlled type 2 diabetes mellitus with diabetic nephropathy, with long-term current use of insulin ([place]) 04/09/2013   Diabetic neuropathy associated with type 2 diabetes mellitus ([place]) 04/09/2013   Cataract of left eye 11/25/2011   Corneal opacity 09/30/2011   Pseudophakia of right eye 09/30/2011   GERD (gastroesophageal reflux disease) 09/25/2011   Gout 09/25/2011   Hyperlipidemia LDL goal <70 09/25/2011   Status post cataract extraction 04/11/2011   Cataract extraction status of eye 04/11/2011   Iron deficiency  anemia 04/01/2011   Benign localized hyperplasia of prostate without urinary obstruction and other lower urinary tract symptoms (LUTS) 12/10/2010   Enlarged prostate without lower urinary tract symptoms (luts) 12/10/2010   Colon cancer screening 10/11/2010   Hypothyroidism 10/11/2010   Hyperlipidemia 09/20/2003   Essential and other specified forms of tremor 01/05/2001   Essential tremor 01/05/2001    Conditions to be addressed/monitored:CHF, CAD, HTN, HLD, COPD, DMII, ESRD, GERD, and Chronic pain, also addressed post op amputation of right great toe  Care Plan : RNCM: General Plan of Care (Adult) for Chronic Disease Management and Care Cooerdination Needs  Updates made by Vanita Ingles, RN since 03/21/2021 12:00 AM     Problem: RNCM: Development of Plan of Care for Chronic Disease Management (HF, CAD, HTN,HLD, DM,  COPD, CKD3, Chronic Pain)   Priority: High     Long-Range Goal: RNCM: Effective Management  of Plan of Care for Chronic Disease Management (HF, CAD, HTN,HLD, DM, COPD, CKD3, Chronic Pain)   Start Date: 01/22/2021  Expected End Date: 01/22/2022  Priority: High  Note:   Current Barriers:  Knowledge Deficits related to plan of care for management of CHF, CAD, HTN, HLD, COPD, DMII, CKD Stage 3, and chronic pain  Care Coordination needs related to Level of care concerns and multiple chronic conditions  Chronic Disease Management support and education needs related to CHF, CAD, HTN, HLD, COPD, DMII, CKD Stage 3, and chronic pain   RNCM Clinical Goal(s):  Patient will verbalize understanding of plan for management of CHF, CAD, HTN, HLD, COPD, DMII, CKD Stage 3, and Osteoarthritis as evidenced by understanding the plan of care, calling the provider for changes, and working with the CCM to optimize health and well being for effective management of chronic conditions take all medications exactly as prescribed and will call provider for medication related questions as evidenced by taking medications as prescribed, contacting providers for questions about medications, and working with the pharm D to manage polypharmacy and medications concerns     attend all scheduled medical appointments: Saw pcp on 03-05-2021. The patients wife calls the office for changes and recommendations as evidenced by by keeping appointments with pcp and other specialist and calling for new appointments needed or questions        demonstrate improved and ongoing adherence to prescribed treatment plan for CHF, CAD, HTN, HLD, COPD, DMII, CKD Stage 3, and Osteoarthritis as evidenced by working with the CCM team to optimize health and well being  demonstrate a decrease in CHF, CAD, HTN, HLD, COPD, DMII, CKD Stage 3, and Osteoarthritis exacerbations  as evidenced by calling the provider for changes in conditions, stable chronic conditions  and working with the CCM team to effectively manage health and well being  demonstrate ongoing self health care management ability for effective management of chronic conditions  as evidenced by  working with the CCM team  through collaboration with Consulting civil engineer, provider, and care team.   Interventions: 1:1 collaboration with primary care provider regarding development and update of comprehensive plan of care as evidenced by provider attestation and co-signature Inter-disciplinary care team collaboration (see longitudinal plan of care) Evaluation of current treatment plan related to  self management and patient's adherence to plan as established by provider   SDOH Barriers (Status: Goal on Track (progressing): YES.) Long Term Goal  Patient interviewed and SDOH assessment performed        Patient interviewed and appropriate assessments performed Provided patient with information about resources in  [facility] and care guides available for assistance with changes in SDOH, questions or concerns Discussed plans with patient for ongoing care management follow up and provided patient with direct contact information for care management team Advised patient to call the office for changes in [place], new concerns or questions  Provided education to patient/caregiver regarding level of care options.    CAD  (Status: Goal on Track (progressing): YES.) Long Term Goal  Assessed understanding of CAD diagnosis Medications reviewed including medications utilized in CAD treatment plan Provided education on importance of blood pressure control in management of CAD; Provided education on Importance of limiting foods high in cholesterol. 03-21-2021: Review of dietary intake and patient is compliant with heart healthy/ADA diet Counseled on importance of regular laboratory monitoring as prescribed; Reviewed Importance of taking all medications as prescribed. 03-21-2021: Review of medications and patient is  compliant with medications. Denies any medication needs or changes Reviewed Importance of attending all scheduled provider appointments. 03-21-2021: [place] appointments, calls for changes and needs Advised to report any changes in symptoms or exercise tolerance Screening for signs and symptoms of depression related to chronic disease state;  Assessed social determinant of health barriers;   Heart Failure Interventions:  (Status: Goal on Track (progressing): YES.)  Long Term Goal  Basic overview and discussion of pathophysiology of Heart Failure reviewed; Provided education on low sodium diet. 03-21-2021: The patient is compliant with heart healthy/ADA diet. Is not feeling the best today because of some diarrhea but wife states he is eating and drinking well; Reviewed Heart Failure Action Plan in depth and provided written copy; Assessed need for readable accurate scales in home. 03-21-2021: Participates in the HF program and weighs daily on a provided mat that send readings into the HF clinic for review and recommendations is any. Provided education about placing scale on hard, flat surface; Advised patient to weigh each morning after emptying bladder; Discussed importance of daily weight and advised patient to weigh and record daily; Reviewed role of diuretics in prevention of fluid overload and management of heart failure; Discussed the importance of keeping all appointments with provider; Provided patient with education about the role of exercise in the management of heart failure; Advised patient to discuss torsemide dose  with provider.  01-22-2021: The patients wife states that she has not heard back from the heart failure clinic on the Torsemide dose. Advised the patients wife to call the HF clinic back and request clarification on Torsemide dose. The patients wife verbalized understanding. She states he is not having any issues with swelling but he is having a problem with "holding his water" and  its been going on for a while. 01-24-2021: The pharm D is also assisting with getting in touch with the cardiologist/HF clinic to get clarification of medication dosage. 03-21-2021: The patient is taking medications as directed. Denies any issues with medication compliance.   Chronic Kidney Disease (Status: Goal on Track (progressing): YES.)  Long Term Goal  Last practice recorded BP readings:  BP Readings from Last 3 Encounters:  02/24/21 124/66  02/14/21 110/62  02/01/21 110/64  Most recent eGFR/CrCl:  Lab Results  Component Value Date   EGFR 44 (L) 06/29/2020    No components found for: CRCL  Assessed the patient   and patients wife for   understanding of chronic kidney disease    Evaluation of current treatment plan related to chronic kidney disease self management and patient's adherence to plan as established by provider . 01-22-2021: The patients  wife states the patient is having issues with "holding his water". States the urologist gave him a pill but it does not help. The patient is taking myrbetriq but the patients wife says he cannot tell where it helps him. Advised the patients wife to follow up with urologist, will also collaborate with the pcp for any other additional recommendations. Advised the patients wife with the multiple medications he takes and his chronic conditions that this is something that may be a little harder to manage.  03-21-2021: The patient is doing well with management of his CKD. The patients wife states there are no new concerns at this time with kidney function.   Provided education to patient re: stroke prevention, s/s of heart attack and stroke    Reviewed prescribed diet heart healthy/ADA diet  Reviewed medications with patient and discussed importance of compliance    Advised patient, providing education and rationale, to monitor blood pressure daily and record, calling PCP for findings outside established parameters    Discussed complications of poorly  controlled blood pressure such as heart disease, stroke, circulatory complications, vision complications, kidney impairment, sexual dysfunction    Advised patient to discuss urinary incontinence and frequency  with provider    Discussed plans with patient for ongoing care management follow up and provided patient with direct contact information for care management team    Discussed the impact of chronic kidney disease on daily life and mental health and acknowledged and normalized feelings of disempowerment, fear, and frustration    Engage patient in early, proactive and ongoing discussion about goals of care and what matters most to them      COPD: (Status: Goal on Track (progressing): YES.) Long Term Goal  Reviewed medications with patient, including use of prescribed maintenance and rescue inhalers, and provided instruction on medication management and the importance of adherence. 03-21-2021: Is compliant with medications Provided patient with basic written and verbal COPD education on self care/management/and exacerbation prevention; Advised patient to track and manage COPD triggers. 03-21-2021: The patient and patients wife are aware of triggers that cause COPD exacerbation. The patient has some GI symptoms going on but is not having any respiratory sx and sx of infection. Education and support given;  Provided written and verbal instructions on pursed lip breathing and utilized returned demonstration as teach back; Provided instruction about proper use of medications used for management of COPD including inhalers; Advised patient to self assesses COPD action plan zone and make appointment with provider if in the yellow zone for 48 hours without improvement; Advised patient to engage in light exercise as tolerated 3-5 days a week to aid in the the management of COPD; Provided education about and advised patient to utilize infection prevention strategies to reduce risk of respiratory  infection; Discussed the importance of adequate rest and management of fatigue with COPD;  Diabetes:  (Status: Goal on Track (progressing): YES.) Long Term Goal   Lab Results  Component Value Date   HGBA1C 7.3 (H) 02/21/2021  Assessed patient's understanding of A1c goal: <7% Provided education to patient about basic DM disease process; Reviewed medications with patient and discussed importance of medication adherence. 03-21-2021: The patients wife states that the patient is compliant with his medications. Denies any concerns with medications.        Reviewed prescribed diet with patient heart healthy/ADA. 03-21-2021: Review of dietary restrictions and monitoring for foods high in sugars, sodium, and fats.  Counseled on importance of regular laboratory monitoring as prescribed. 03-21-2021: The  patient has regular lab work for evaluation of A1C and glucose;        Discussed plans with patient for ongoing care management follow up and provided patient with direct contact information for care management team;      Provided patient with written educational materials related to hypo and hyperglycemia and importance of correct treatment. 03-21-2021: Denies any lows at this time. ;       Reviewed scheduled/upcoming provider appointments including: Saw pcp on 03-05-2021. Knows to call for changes ;         Advised patient, providing education and rationale, to check cbg as directed  and record. 03-21-2021: The patients wife states his blood sugars this am were 110. Range for blood sugar readings have been 110 to 130. Review of goal of fasting blood sugars of <130 and post prandial of <180.        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 03-21-2021: The patients wife states the patient is doing  well and the area is healing at the site of where his right great toe was amputated. He has PT/OT and a nurse coming in the home to evaluate and monitor. The patient is walking with his walker and the patients wife denies any falls or safety concerns. The patient has a follow up with the provider on 04-10-2021. Education on sx and sx of infection and to call the office for changes at surgical site, questions or concerns.    Hyperlipidemia:  (Status: Goal on Track (progressing): YES.) Lab Results  Component Value Date   CHOL 123 06/29/2020   HDL 29 (L) 06/29/2020   LDLCALC 65 06/29/2020   TRIG 170 (H) 06/29/2020   CHOLHDL 4.2 06/29/2020     Medication review performed; medication list updated in electronic medical record.  Provider established cholesterol goals reviewed. 03-21-2021: Review of labs and the patient is compliant with regular lab work needs; Counseled on importance of regular laboratory monitoring as prescribed; Provided HLD educational materials; Reviewed role and benefits of statin for ASCVD risk reduction; Discussed strategies to manage statin-induced myalgias; Reviewed importance of limiting foods high in cholesterol. 03-21-2021: Review of heart healthy/ADA diet and foods to limit Reviewed exercise goals and target of 150 minutes per week;  Hypertension: (Status: Goal on Track (progressing): YES.) Last practice recorded BP readings:  BP Readings from Last 3 Encounters:  02/24/21 124/66  02/14/21 110/62  02/01/21 110/64  Most recent eGFR/CrCl:  Lab Results  Component Value Date   EGFR 44 (L) 06/29/2020    No components found for: CRCL  Evaluation of current treatment plan related to hypertension self management and patient's adherence to plan as established by provider. 01-24-2021: The patient was eating breakfast. Reviewed with wife several upcoming appointments and follow ups scheduled.   03-21-2021: The patient is having more normal blood pressure readings. The patient  denies any issues with HTN or heart health. Has a lot of chronic conditions impacting his care. Will continue to monitor for changes.  Provided education to patient re: stroke prevention, s/s of heart attack and stroke; Reviewed prescribed diet heart healthy/ADA Reviewed medications with patient and discussed importance of compliance;  Discussed plans with patient for ongoing care management follow up and provided patient with direct contact information for care management  team; Advised patient, providing education and rationale, to monitor blood pressure daily and record, calling PCP for findings outside established parameters;  Advised patient to discuss blood pressure trends  with provider; Provided education on prescribed diet heart healthy/ADA diet ;  Discussed complications of poorly controlled blood pressure such as heart disease, stroke, circulatory complications, vision complications, kidney impairment, sexual dysfunction;   Pain:  (Status: Goal on Track (progressing): YES.) Long Term Goal  Pain assessment performed. 03-24-2020: the patient rates his pain in his bilateral shoulders at an 8 today on a scale of 0-10.  The patient states that he cannot raise his arms but so high. The patient is not taking any medications for pain relief. Has not tried heat or cold applications. The patient states the injections he gets at the provider office usually help but they have not this time. The patient states that he is open to recommendations. 03-21-2021: The patient denies any pain today. The patient has upcoming imaging to evaluate shoulders.  Medications reviewed. 01-22-2021: The patient is currently not taking any medications for pain relief. Does have gabapentin that he takes on a consistent basis for neuropathy pain. Dosage in question concerning this as the neurologist has increased dose to 800 mg and renal dose recommends dose not to exceed 600 mg BID. Education with wife concerning medication changes.  03-21-2021: Is compliant with medications regimen.  Reviewed provider established plan for pain management. 01-22-2021: The patient and the patients wife ask if physical therapy would help with shoulder pain and discomfort. Will collaborate with the pcp on recommendations and let the patient and the patients wife know. 01-24-2021: Call made to [facility] on 01-23-2021 to ask about referral from pcp office. The staff states the referral was received and the patient has an appointment With Dr. Eliberto Ivory at 10 am on 02-14-2021.  Attempted to reach the patient or patients wife but was unsuccessful. Returned call today and explained to the wife the orthopedic appointment would address the patients shoulder pain and evaluate and recommend further treatment. Used teach back method for understanding of upcoming appointment with the orthopedic provider. Will continue to monitor. 03-21-2021: The patient is following the recommendations of the provider. The patient is seen on a consistent basis for evaluation and treatment options. Discussed importance of adherence to all scheduled medical appointments; Counseled on the importance of reporting any/all new or changed pain symptoms or management strategies to pain management provider; Advised patient to report to care team affect of pain on daily activities; Discussed use of relaxation techniques and/or diversional activities to assist with pain reduction (distraction, imagery, relaxation, massage, acupressure, TENS, heat, and cold application; Reviewed with patient prescribed pharmacological and nonpharmacological pain relief strategies; Advised patient to discuss unresolved pain, changes in level and intensity of pain with provider;   GERD/Acid reflux  (Status: New goal.) Long Term Goal  Evaluation of current treatment plan related to GERD,  self-management and patient's adherence to plan as established by provider. 03-21-2021: During outreach today the patients wife states  the patient for the last 3 days has had a "sour burp" and not feeling the best with episodes of diarrhea x 3 to 4 times during the night. Denies fever or chills. Is tolerating food and beverages without difficulty. Secure message sent to pcp and CMA for recommendations. Call made back to the wife with instructions from Dr. Parks Ranger Discussed plans with patient for ongoing care management follow up and provided patient with direct contact information for care management team  Advised patient to call the office if symptoms of GERD/ACID reflux do not improve with recommended changes; Provided education to patient re: for the patient at the direction of pcp to increase his omeprazole dose from 20 mg daily to 40 mg daily. Used the teach back method to make sure the patients wife understood the instructions given. The patients wife wrote down instructions and repeated instructions back to [facility]; Reviewed medications with patient and discussed 03-21-2021: Advised the patients wife to only use imodium for severe diarrhea. Also advised per instructions from Dr. Parks Ranger that is was okay to take gas-x and pepto bismol as needed. Also to increase Omeprazole dose from 33m daily to 40 mg daily; Collaborated with PCP, CMA, and pharm D regarding new complaint of GI symptoms. Recommendations received and the wife called back with instructions. ; Provided patient with instructions from pcp for new onset of GI symptoms x 3 days educational materials related to new onset of GI symptoms; Pharmacy referral for collaboration, ongoing support, and education for medications reconciliation and management ; Discussed plans with patient for ongoing care management follow up and provided patient with direct contact information for care management team; Advised patient to discuss unresolved GI condition with recommended changes, questions, concerns and sx and sx of worsening condition with provider;   Patient Goals/Self-Care  Activities: Patient will self administer medications as prescribed as evidenced by self report/primary caregiver report  Patient will attend all scheduled provider appointments as evidenced by clinician review of documented attendance to scheduled appointments and patient/caregiver report Patient will call pharmacy for medication refills as evidenced by patient report and review of pharmacy fill history as appropriate Patient will attend church or other social activities as evidenced by patient report Patient will continue to perform ADL's independently as evidenced by patient/caregiver report Patient will continue to perform IADL's independently as evidenced by patient/caregiver report Patient will call provider office for new concerns or questions as evidenced by review of documented incoming telephone call notes and patient report Patient will work with BSW to address care coordination needs and will continue to work with the clinical team to address health care and disease management related needs as evidenced by documented adherence to scheduled care management/care coordination appointments - call office if I gain more than 2 pounds in one day or 5 pounds in one week - keep legs up while sitting - track weight in diary - use salt in moderation - watch for swelling in feet, ankles and legs every day - weigh myself daily - develop a rescue plan - follow rescue plan if symptoms flare-up - know when to call the doctor:for changes in HF sx and sx - track symptoms and what helps feel better or worse - dress right for the weather, hot or cold - schedule appointment with eye doctor - check blood sugar at prescribed times: before meals and at bedtime, when you have symptoms of low or high blood sugar, and before and after exercise - check feet daily for cuts, sores or redness - enter blood sugar readings and medication or insulin into daily log - take the blood sugar log to all doctor visits -  trim toenails straight across - drink 6 to 8 glasses of water each day - eat fish at least once per week - fill half of plate with vegetables - limit fast food meals to no more than 1 per week - manage portion size - prepare main meal at home 3 to 5 days each week -  read food labels for fat, fiber, carbohydrates and portion size - reduce red meat to 2 to 3 times a week - keep feet up while sitting - wash and dry feet carefully every day - wear comfortable, cotton socks - wear comfortable, well-fitting shoes Keep appointments with the podiatrist, next appointment on 01-24-2021 - avoid second hand smoke - eliminate smoking in my home - identify and remove indoor air pollutants - limit outdoor activity during cold weather - listen for public air quality announcements every day - do breathing exercises every day - develop a rescue plan - eliminate symptom triggers at home - follow rescue plan if symptoms flare-up - use an extra pillow to sleep - develop a new routine to improve sleep - don't eat or exercise right before bedtime - eat healthy/prescribed diet: heart healthy/ADA diet  - get at least 7 to 8 hours of sleep at night - use devices that will help like a cane, sock-puller or reacher - practice relaxation or meditation daily - do exercises in a comfortable position that makes breathing as easy as possible - check blood pressure 3 times per week - choose a place to take my blood pressure (home, clinic or office, retail store) - write blood pressure results in a log or diary - learn about high blood pressure - keep a blood pressure log - take blood pressure log to all doctor appointments - call doctor for signs and symptoms of high blood pressure - develop an action plan for high blood pressure - keep all doctor appointments - take medications for blood pressure exactly as prescribed - report new symptoms to your doctor - eat more whole grains, fruits and vegetables, lean  meats and healthy fats - call for medicine refill 2 or 3 days before it runs out - take all medications exactly as prescribed - call doctor with any symptoms you believe are related to your medicine - call doctor when you experience any new symptoms - go to all doctor appointments as scheduled - adhere to prescribed diet: heart healthy/ADA diet        Plan:Telephone follow up appointment with care management team member scheduled for:  04-19-2021 at 0900 am  Noreene Larsson RN, MSN, [facility] Mobile: [phone]

## 2021-03-22 ENCOUNTER — Other Ambulatory Visit: Admit: 2021-03-22 | Discharge: 2021-03-23 | Payer: MEDICARE

## 2021-03-22 ENCOUNTER — Ambulatory Visit: Admit: 2021-03-22 | Discharge: 2021-03-23 | Payer: MEDICARE

## 2021-03-22 DIAGNOSIS — C9210 Chronic myeloid leukemia, BCR/ABL-positive, not having achieved remission: Principal | ICD-10-CM

## 2021-03-23 ENCOUNTER — Other Ambulatory Visit
Admission: RE | Admit: 2021-03-23 | Discharge: 2021-03-23 | Disposition: A | Discharge status: Home / Self Care | Payer: Medicare HMO | Attending: Adult Health | Admitting: Adult Health

## 2021-03-23 DIAGNOSIS — I5022 Chronic systolic (congestive) heart failure: Secondary | ICD-10-CM | POA: Insufficient documentation

## 2021-03-23 LAB — BASIC METABOLIC PANEL
Anion gap: 7 (ref 5–15)
BUN: 28 mg/dL — ABNORMAL HIGH (ref 8–23)
CO2: 22 mmol/L (ref 22–32)
Calcium: 8.7 mg/dL — ABNORMAL LOW (ref 8.9–10.3)
Chloride: 108 mmol/L (ref 98–111)
Creatinine, Ser: 1.59 mg/dL — ABNORMAL HIGH (ref 0.61–1.24)
GFR, Estimated: 42 mL/min — ABNORMAL LOW (ref >60)
Glucose, Bld: 114 mg/dL — ABNORMAL HIGH (ref 70–99)
Potassium: 3.8 mmol/L (ref 3.5–5.1)
Sodium: 137 mmol/L (ref 135–145)

## 2021-03-24 DIAGNOSIS — C9210 Chronic myeloid leukemia, BCR/ABL-positive, not having achieved remission: Secondary | ICD-10-CM | POA: Diagnosis not present

## 2021-03-24 DIAGNOSIS — N184 Chronic kidney disease, stage 4 (severe): Secondary | ICD-10-CM | POA: Diagnosis not present

## 2021-03-24 DIAGNOSIS — D509 Iron deficiency anemia, unspecified: Secondary | ICD-10-CM | POA: Diagnosis not present

## 2021-03-24 DIAGNOSIS — Z89411 Acquired absence of right great toe: Secondary | ICD-10-CM | POA: Diagnosis not present

## 2021-03-24 DIAGNOSIS — I951 Orthostatic hypotension: Secondary | ICD-10-CM | POA: Diagnosis not present

## 2021-03-24 DIAGNOSIS — E1169 Type 2 diabetes mellitus with other specified complication: Secondary | ICD-10-CM | POA: Diagnosis not present

## 2021-03-24 DIAGNOSIS — I480 Paroxysmal atrial fibrillation: Secondary | ICD-10-CM | POA: Diagnosis not present

## 2021-03-24 DIAGNOSIS — I25118 Atherosclerotic heart disease of native coronary artery with other forms of angina pectoris: Secondary | ICD-10-CM | POA: Diagnosis not present

## 2021-03-24 DIAGNOSIS — D630 Anemia in neoplastic disease: Secondary | ICD-10-CM | POA: Diagnosis not present

## 2021-03-24 DIAGNOSIS — I255 Ischemic cardiomyopathy: Secondary | ICD-10-CM | POA: Diagnosis not present

## 2021-03-24 DIAGNOSIS — M1030 Gout due to renal impairment, unspecified site: Secondary | ICD-10-CM | POA: Diagnosis not present

## 2021-03-24 DIAGNOSIS — Z4781 Encounter for orthopedic aftercare following surgical amputation: Secondary | ICD-10-CM | POA: Diagnosis not present

## 2021-03-24 DIAGNOSIS — D631 Anemia in chronic kidney disease: Secondary | ICD-10-CM | POA: Diagnosis not present

## 2021-03-24 DIAGNOSIS — G43909 Migraine, unspecified, not intractable, without status migrainosus: Secondary | ICD-10-CM | POA: Diagnosis not present

## 2021-03-24 DIAGNOSIS — G4733 Obstructive sleep apnea (adult) (pediatric): Secondary | ICD-10-CM | POA: Diagnosis not present

## 2021-03-24 DIAGNOSIS — E871 Hypo-osmolality and hyponatremia: Secondary | ICD-10-CM | POA: Diagnosis not present

## 2021-03-24 DIAGNOSIS — I5042 Chronic combined systolic (congestive) and diastolic (congestive) heart failure: Secondary | ICD-10-CM | POA: Diagnosis not present

## 2021-03-24 DIAGNOSIS — M1990 Unspecified osteoarthritis, unspecified site: Secondary | ICD-10-CM | POA: Diagnosis not present

## 2021-03-24 DIAGNOSIS — I130 Hypertensive heart and chronic kidney disease with heart failure and stage 1 through stage 4 chronic kidney disease, or unspecified chronic kidney disease: Secondary | ICD-10-CM | POA: Diagnosis not present

## 2021-03-24 DIAGNOSIS — I4510 Unspecified right bundle-branch block: Secondary | ICD-10-CM | POA: Diagnosis not present

## 2021-03-24 DIAGNOSIS — J432 Centrilobular emphysema: Secondary | ICD-10-CM | POA: Diagnosis not present

## 2021-03-24 DIAGNOSIS — H5461 Unqualified visual loss, right eye, normal vision left eye: Secondary | ICD-10-CM | POA: Diagnosis not present

## 2021-03-24 DIAGNOSIS — M86071 Acute hematogenous osteomyelitis, right ankle and foot: Secondary | ICD-10-CM | POA: Diagnosis not present

## 2021-03-24 DIAGNOSIS — N400 Enlarged prostate without lower urinary tract symptoms: Secondary | ICD-10-CM | POA: Diagnosis not present

## 2021-03-24 DIAGNOSIS — E1122 Type 2 diabetes mellitus with diabetic chronic kidney disease: Secondary | ICD-10-CM | POA: Diagnosis not present

## 2021-03-26 ENCOUNTER — Telehealth: Payer: Medicare HMO

## 2021-03-27 ENCOUNTER — Ambulatory Visit
Admission: RE | Admit: 2021-03-27 | Discharge: 2021-03-27 | Disposition: A | Discharge status: Home / Self Care | Payer: Medicare HMO | Source: Ambulatory Visit | Attending: Physician Assistant | Admitting: Physician Assistant

## 2021-03-27 ENCOUNTER — Other Ambulatory Visit: Payer: Self-pay

## 2021-03-27 DIAGNOSIS — M12812 Other specific arthropathies, not elsewhere classified, left shoulder: Secondary | ICD-10-CM | POA: Insufficient documentation

## 2021-03-27 DIAGNOSIS — M12811 Other specific arthropathies, not elsewhere classified, right shoulder: Secondary | ICD-10-CM | POA: Diagnosis not present

## 2021-03-27 DIAGNOSIS — M25511 Pain in right shoulder: Secondary | ICD-10-CM | POA: Diagnosis not present

## 2021-03-29 ENCOUNTER — Ambulatory Visit: Admit: 2021-03-29 | Discharge: 2021-03-30 | Payer: MEDICARE

## 2021-03-29 DIAGNOSIS — C9210 Chronic myeloid leukemia, BCR/ABL-positive, not having achieved remission: Secondary | ICD-10-CM | POA: Diagnosis not present

## 2021-03-30 DIAGNOSIS — C9210 Chronic myeloid leukemia, BCR/ABL-positive, not having achieved remission: Secondary | ICD-10-CM | POA: Diagnosis not present

## 2021-03-30 DIAGNOSIS — N184 Chronic kidney disease, stage 4 (severe): Secondary | ICD-10-CM | POA: Diagnosis not present

## 2021-03-30 DIAGNOSIS — I130 Hypertensive heart and chronic kidney disease with heart failure and stage 1 through stage 4 chronic kidney disease, or unspecified chronic kidney disease: Secondary | ICD-10-CM | POA: Diagnosis not present

## 2021-03-30 DIAGNOSIS — I25118 Atherosclerotic heart disease of native coronary artery with other forms of angina pectoris: Secondary | ICD-10-CM | POA: Diagnosis not present

## 2021-03-30 DIAGNOSIS — E1122 Type 2 diabetes mellitus with diabetic chronic kidney disease: Secondary | ICD-10-CM | POA: Diagnosis not present

## 2021-03-30 DIAGNOSIS — I255 Ischemic cardiomyopathy: Secondary | ICD-10-CM | POA: Diagnosis not present

## 2021-03-30 DIAGNOSIS — I4510 Unspecified right bundle-branch block: Secondary | ICD-10-CM | POA: Diagnosis not present

## 2021-03-30 DIAGNOSIS — D509 Iron deficiency anemia, unspecified: Secondary | ICD-10-CM | POA: Diagnosis not present

## 2021-03-30 DIAGNOSIS — H5461 Unqualified visual loss, right eye, normal vision left eye: Secondary | ICD-10-CM | POA: Diagnosis not present

## 2021-03-30 DIAGNOSIS — N400 Enlarged prostate without lower urinary tract symptoms: Secondary | ICD-10-CM | POA: Diagnosis not present

## 2021-03-30 DIAGNOSIS — G4733 Obstructive sleep apnea (adult) (pediatric): Secondary | ICD-10-CM | POA: Diagnosis not present

## 2021-03-30 DIAGNOSIS — Z89411 Acquired absence of right great toe: Secondary | ICD-10-CM | POA: Diagnosis not present

## 2021-03-30 DIAGNOSIS — M1990 Unspecified osteoarthritis, unspecified site: Secondary | ICD-10-CM | POA: Diagnosis not present

## 2021-03-30 DIAGNOSIS — J432 Centrilobular emphysema: Secondary | ICD-10-CM | POA: Diagnosis not present

## 2021-03-30 DIAGNOSIS — E1169 Type 2 diabetes mellitus with other specified complication: Secondary | ICD-10-CM | POA: Diagnosis not present

## 2021-03-30 DIAGNOSIS — E871 Hypo-osmolality and hyponatremia: Secondary | ICD-10-CM | POA: Diagnosis not present

## 2021-03-30 DIAGNOSIS — I951 Orthostatic hypotension: Secondary | ICD-10-CM | POA: Diagnosis not present

## 2021-03-30 DIAGNOSIS — M86071 Acute hematogenous osteomyelitis, right ankle and foot: Secondary | ICD-10-CM | POA: Diagnosis not present

## 2021-03-30 DIAGNOSIS — G43909 Migraine, unspecified, not intractable, without status migrainosus: Secondary | ICD-10-CM | POA: Diagnosis not present

## 2021-03-30 DIAGNOSIS — I5042 Chronic combined systolic (congestive) and diastolic (congestive) heart failure: Secondary | ICD-10-CM | POA: Diagnosis not present

## 2021-03-30 DIAGNOSIS — D630 Anemia in neoplastic disease: Secondary | ICD-10-CM | POA: Diagnosis not present

## 2021-03-30 DIAGNOSIS — I480 Paroxysmal atrial fibrillation: Secondary | ICD-10-CM | POA: Diagnosis not present

## 2021-03-30 DIAGNOSIS — M1030 Gout due to renal impairment, unspecified site: Secondary | ICD-10-CM | POA: Diagnosis not present

## 2021-03-30 DIAGNOSIS — Z4781 Encounter for orthopedic aftercare following surgical amputation: Secondary | ICD-10-CM | POA: Diagnosis not present

## 2021-03-30 DIAGNOSIS — D631 Anemia in chronic kidney disease: Secondary | ICD-10-CM | POA: Diagnosis not present

## 2021-03-31 DIAGNOSIS — C9210 Chronic myeloid leukemia, BCR/ABL-positive, not having achieved remission: Secondary | ICD-10-CM | POA: Diagnosis not present

## 2021-03-31 DIAGNOSIS — I4510 Unspecified right bundle-branch block: Secondary | ICD-10-CM | POA: Diagnosis not present

## 2021-03-31 DIAGNOSIS — I255 Ischemic cardiomyopathy: Secondary | ICD-10-CM | POA: Diagnosis not present

## 2021-03-31 DIAGNOSIS — D631 Anemia in chronic kidney disease: Secondary | ICD-10-CM | POA: Diagnosis not present

## 2021-03-31 DIAGNOSIS — E1169 Type 2 diabetes mellitus with other specified complication: Secondary | ICD-10-CM | POA: Diagnosis not present

## 2021-03-31 DIAGNOSIS — N184 Chronic kidney disease, stage 4 (severe): Secondary | ICD-10-CM | POA: Diagnosis not present

## 2021-03-31 DIAGNOSIS — I5042 Chronic combined systolic (congestive) and diastolic (congestive) heart failure: Secondary | ICD-10-CM | POA: Diagnosis not present

## 2021-03-31 DIAGNOSIS — M1030 Gout due to renal impairment, unspecified site: Secondary | ICD-10-CM | POA: Diagnosis not present

## 2021-03-31 DIAGNOSIS — I951 Orthostatic hypotension: Secondary | ICD-10-CM | POA: Diagnosis not present

## 2021-03-31 DIAGNOSIS — M86071 Acute hematogenous osteomyelitis, right ankle and foot: Secondary | ICD-10-CM | POA: Diagnosis not present

## 2021-03-31 DIAGNOSIS — H5461 Unqualified visual loss, right eye, normal vision left eye: Secondary | ICD-10-CM | POA: Diagnosis not present

## 2021-03-31 DIAGNOSIS — E871 Hypo-osmolality and hyponatremia: Secondary | ICD-10-CM | POA: Diagnosis not present

## 2021-03-31 DIAGNOSIS — M1990 Unspecified osteoarthritis, unspecified site: Secondary | ICD-10-CM | POA: Diagnosis not present

## 2021-03-31 DIAGNOSIS — J432 Centrilobular emphysema: Secondary | ICD-10-CM | POA: Diagnosis not present

## 2021-03-31 DIAGNOSIS — I25118 Atherosclerotic heart disease of native coronary artery with other forms of angina pectoris: Secondary | ICD-10-CM | POA: Diagnosis not present

## 2021-03-31 DIAGNOSIS — D630 Anemia in neoplastic disease: Secondary | ICD-10-CM | POA: Diagnosis not present

## 2021-03-31 DIAGNOSIS — I480 Paroxysmal atrial fibrillation: Secondary | ICD-10-CM | POA: Diagnosis not present

## 2021-03-31 DIAGNOSIS — G4733 Obstructive sleep apnea (adult) (pediatric): Secondary | ICD-10-CM | POA: Diagnosis not present

## 2021-03-31 DIAGNOSIS — D509 Iron deficiency anemia, unspecified: Secondary | ICD-10-CM | POA: Diagnosis not present

## 2021-03-31 DIAGNOSIS — Z89411 Acquired absence of right great toe: Secondary | ICD-10-CM | POA: Diagnosis not present

## 2021-03-31 DIAGNOSIS — I130 Hypertensive heart and chronic kidney disease with heart failure and stage 1 through stage 4 chronic kidney disease, or unspecified chronic kidney disease: Secondary | ICD-10-CM | POA: Diagnosis not present

## 2021-03-31 DIAGNOSIS — E1122 Type 2 diabetes mellitus with diabetic chronic kidney disease: Secondary | ICD-10-CM | POA: Diagnosis not present

## 2021-03-31 DIAGNOSIS — G43909 Migraine, unspecified, not intractable, without status migrainosus: Secondary | ICD-10-CM | POA: Diagnosis not present

## 2021-03-31 DIAGNOSIS — N400 Enlarged prostate without lower urinary tract symptoms: Secondary | ICD-10-CM | POA: Diagnosis not present

## 2021-03-31 DIAGNOSIS — Z4781 Encounter for orthopedic aftercare following surgical amputation: Secondary | ICD-10-CM | POA: Diagnosis not present

## 2021-04-02 ENCOUNTER — Telehealth: Payer: Medicare HMO

## 2021-04-02 ENCOUNTER — Telehealth: Payer: Self-pay | Admitting: Pharmacist

## 2021-04-02 NOTE — Telephone Encounter (Signed)
°  Chronic Care Management   Outreach Note  04/02/2021 Name: Lucas Caldwell MRN: [id number] DOB: 10/10/1933  Referred by: [name], DO Reason for referral : No chief complaint on file.   Was unable to reach patient via telephone today and unable to leave a message as no voicemail picks up   Follow Up Plan: Will collaborate with Care Guide to outreach to schedule follow up with me  [name], PharmD, [facility] [phone]

## 2021-04-03 DIAGNOSIS — Z89411 Acquired absence of right great toe: Secondary | ICD-10-CM | POA: Diagnosis not present

## 2021-04-03 DIAGNOSIS — M1030 Gout due to renal impairment, unspecified site: Secondary | ICD-10-CM | POA: Diagnosis not present

## 2021-04-03 DIAGNOSIS — D630 Anemia in neoplastic disease: Secondary | ICD-10-CM | POA: Diagnosis not present

## 2021-04-03 DIAGNOSIS — N184 Chronic kidney disease, stage 4 (severe): Secondary | ICD-10-CM | POA: Diagnosis not present

## 2021-04-03 DIAGNOSIS — I5042 Chronic combined systolic (congestive) and diastolic (congestive) heart failure: Secondary | ICD-10-CM | POA: Diagnosis not present

## 2021-04-03 DIAGNOSIS — J432 Centrilobular emphysema: Secondary | ICD-10-CM | POA: Diagnosis not present

## 2021-04-03 DIAGNOSIS — I25118 Atherosclerotic heart disease of native coronary artery with other forms of angina pectoris: Secondary | ICD-10-CM | POA: Diagnosis not present

## 2021-04-03 DIAGNOSIS — N400 Enlarged prostate without lower urinary tract symptoms: Secondary | ICD-10-CM | POA: Diagnosis not present

## 2021-04-03 DIAGNOSIS — G43909 Migraine, unspecified, not intractable, without status migrainosus: Secondary | ICD-10-CM | POA: Diagnosis not present

## 2021-04-03 DIAGNOSIS — M1990 Unspecified osteoarthritis, unspecified site: Secondary | ICD-10-CM | POA: Diagnosis not present

## 2021-04-03 DIAGNOSIS — I4510 Unspecified right bundle-branch block: Secondary | ICD-10-CM | POA: Diagnosis not present

## 2021-04-03 DIAGNOSIS — M86071 Acute hematogenous osteomyelitis, right ankle and foot: Secondary | ICD-10-CM | POA: Diagnosis not present

## 2021-04-03 DIAGNOSIS — G4733 Obstructive sleep apnea (adult) (pediatric): Secondary | ICD-10-CM | POA: Diagnosis not present

## 2021-04-03 DIAGNOSIS — I130 Hypertensive heart and chronic kidney disease with heart failure and stage 1 through stage 4 chronic kidney disease, or unspecified chronic kidney disease: Secondary | ICD-10-CM | POA: Diagnosis not present

## 2021-04-03 DIAGNOSIS — Z4781 Encounter for orthopedic aftercare following surgical amputation: Secondary | ICD-10-CM | POA: Diagnosis not present

## 2021-04-03 DIAGNOSIS — C9210 Chronic myeloid leukemia, BCR/ABL-positive, not having achieved remission: Secondary | ICD-10-CM | POA: Diagnosis not present

## 2021-04-03 DIAGNOSIS — I951 Orthostatic hypotension: Secondary | ICD-10-CM | POA: Diagnosis not present

## 2021-04-03 DIAGNOSIS — I255 Ischemic cardiomyopathy: Secondary | ICD-10-CM | POA: Diagnosis not present

## 2021-04-03 DIAGNOSIS — D509 Iron deficiency anemia, unspecified: Secondary | ICD-10-CM | POA: Diagnosis not present

## 2021-04-03 DIAGNOSIS — E871 Hypo-osmolality and hyponatremia: Secondary | ICD-10-CM | POA: Diagnosis not present

## 2021-04-03 DIAGNOSIS — E1122 Type 2 diabetes mellitus with diabetic chronic kidney disease: Secondary | ICD-10-CM | POA: Diagnosis not present

## 2021-04-03 DIAGNOSIS — I480 Paroxysmal atrial fibrillation: Secondary | ICD-10-CM | POA: Diagnosis not present

## 2021-04-03 DIAGNOSIS — E1169 Type 2 diabetes mellitus with other specified complication: Secondary | ICD-10-CM | POA: Diagnosis not present

## 2021-04-03 DIAGNOSIS — H5461 Unqualified visual loss, right eye, normal vision left eye: Secondary | ICD-10-CM | POA: Diagnosis not present

## 2021-04-03 DIAGNOSIS — D631 Anemia in chronic kidney disease: Secondary | ICD-10-CM | POA: Diagnosis not present

## 2021-04-04 ENCOUNTER — Ambulatory Visit (INDEPENDENT_AMBULATORY_CARE_PROVIDER_SITE_OTHER): Payer: Medicare HMO

## 2021-04-04 ENCOUNTER — Other Ambulatory Visit: Payer: Self-pay

## 2021-04-04 DIAGNOSIS — I480 Paroxysmal atrial fibrillation: Secondary | ICD-10-CM

## 2021-04-04 DIAGNOSIS — Z7901 Long term (current) use of anticoagulants: Secondary | ICD-10-CM

## 2021-04-04 LAB — POCT INR: INR: 3.1 — AB (ref 2.0–3.0)

## 2021-04-04 NOTE — Patient Instructions (Signed)
-   have a serving of greens today - continue dosage of warfarin of 1.5 TABLETS EVERY DAY EXCEPT 1 TABLETS ON MONDAYS & FRIDAYS. - recheck INR in 4 weeks

## 2021-04-05 ENCOUNTER — Ambulatory Visit: Payer: Medicare HMO | Admitting: Internal Medicine

## 2021-04-05 ENCOUNTER — Ambulatory Visit: Payer: Self-pay | Admitting: *Deleted

## 2021-04-05 NOTE — Progress Notes (Deleted)
Subjective:    Patient ID: Lucas Caldwell, male    DOB: [birth year], 88 y.o.   MRN: [id number]  HPI  Patient presents the clinic today with complaint of burping, reflux and constipation.  He reports this started 3 days ago.  [name] taking MiraLAX as prescribed but reports only loose watery stools.  He does have a history of GERD and is taking Omeprazole as prescribed.  Review of Systems  Past Medical History:  Diagnosis Date   Arthritis    "probably in his legs" (08/15/2016)   Blind right eye    BPH (benign prostatic hyperplasia)    Breast asymmetry    Left breast is larger, present for several years.   CAD (coronary artery disease)    a. Cath in the late 90's - reportedly ok;  b. 2014 s/p stenting x 2 @ [facility]; c 08/19/16 Cath/PCI with DES -> RCA, plan to treat LM 70% medically. Seen by surgery and felt to be too high risk for CABG; d. 08/2018 Cath: LM 70 (iFR 0.86), LAD 20ost, 40p, 50/13m, D1 70, LCX patent stent, OM1 30, RCA patent stent, 74m ISR, 40d, RPDA 30. PCWP 8. CO/CI 4.0/2.1; e. 03/2019 PCI to LAD (2.5x15 Resolute Onyx DES).   Chronic combined systolic (congestive) and diastolic (congestive) heart failure ([place])    a. Previously reduced EF-->50% by echo in 2012;  b. 06/2015 Echo: EF 50-55%  c. 07/2016 Echo: EF 45-50%; d. 12/2017 Echo: EF 55%; e. 08/2018 Echo: EF 35-40%; f. 04/2019 Echo: EF 30-35%; g. s/p cardiomems; h. 10/2020 Echo: EF 30-35%, mod-sev glob HK, gr1 DD.   Chronic kidney disease (CKD), stage IV (severe) (HCC)    CML (chronic myelocytic leukemia) (HCC)    GERD (gastroesophageal reflux disease)    GIB (gastrointestinal bleeding)    a. 12/2017 3 unit PRBC GIB in setting of coumadin-->Endo/colon multiple duodenal ulcers and a single bleeding ulcer in the proximal ascending colon status post hemostatic clipping x2.   Gout    Hyperlipemia    Hypertension    Hypothyroidism    Iron deficiency anemia    Ischemic cardiomyopathy    a. Previously reduced EF-->50% by echo in 2012;   b. 06/2015 Echo: EF 50-55%, Gr2 DD;  c. 07/2016 Echo: EF 45-50%; d. 12/2017 Echo: EF 55%, Gr1 DD, mild MR; e. 08/2018 Echo: EF 35-40%; f. 04/2019 Echo: EF 30-35%; g. 10/2020 Echo: EF 30-35%.   Migraine    "in the 1960s" (08/15/2016)   Obstructive sleep apnea    Orthostatic hypotension    a. 20222 -->carvedilol and hydralazine d/c'd.   PAF (paroxysmal atrial fibrillation) (HCC)    a. ? Dx 2014-->s/p DCCV;  b. CHA2DS2VASc = 6--> Coumadin.   Prostate cancer (HCC)    RBBB    Type II diabetes mellitus (HCC)     Current Outpatient Medications  Medication Sig Dispense Refill   acetaminophen (TYLENOL) 500 MG tablet Take 1,000 mg by mouth every 6 (six) hours as needed for moderate pain or headache.      Alcohol Swabs (B-D SINGLE USE SWABS REGULAR) PADS Use to check blood sugar up to 2 times daily 200 each 3   allopurinol (ZYLOPRIM) 100 MG tablet TAKE ONE TABLET BY MOUTH ONCE DAILY (Patient taking differently: Take 100 mg by mouth daily.) 90 tablet 0   aspirin 81 MG chewable tablet Chew 81 mg by mouth daily.     Blood Glucose Monitoring Suppl (TRUE METRIX METER) DEVI Use to check blood sugar up to  2 times daily 1 each 0   bosutinib (BOSULIF) 100 MG tablet Take 100 mg by mouth at bedtime. Take with food.     Cholecalciferol 25 MCG (1000 UT) capsule Take 1,000 Units by mouth daily.     gabapentin (NEURONTIN) 300 MG capsule Take 300 mg by mouth 3 (three) times daily.     Insulin Pen Needle 31G X 5 MM MISC 31 g by Does not apply route as directed. 100 each 5   isosorbide mononitrate (IMDUR) 60 MG 24 hr tablet Take 0.5 tablets (30 mg total) by mouth daily. 90 tablet 0   JARDIANCE 10 MG TABS tablet Take 1 tablet (10 mg total) by mouth daily with breakfast. 90 tablet 1   levothyroxine (SYNTHROID) 25 MCG tablet TAKE ONE TABLET BY MOUTH EVERY MORNING BEFORE BREAKFAST (Patient taking differently: Take 25 mcg by mouth daily before breakfast.) 90 tablet 1   losartan (COZAAR) 25 MG tablet Take 12.5 mg by mouth daily.      Menthol-Zinc Oxide (GOLD BOND EX) Apply 1 application topically daily as needed (muscle pain).     Multiple Vitamin (MULTIVITAMIN WITH MINERALS) TABS tablet Take 1 tablet by mouth daily.     nitroGLYCERIN (NITROSTAT) 0.4 MG SL tablet Place 1 tablet (0.4 mg total) under the tongue every 5 (five) minutes x 3 doses as needed for chest pain. 25 tablet 1   omeprazole (PRILOSEC) 20 MG capsule Take 20 mg by mouth in the morning. Per Dr. Raliegh Ip instructed the patients wife to increase omeprazole to 40 mg daily (03-21-2021)     Polyethyl Glycol-Propyl Glycol (SYSTANE OP) Place 1 drop into both eyes 3 (three) times daily as needed (dry/irritated eyes.).      polyethylene glycol (MIRALAX / GLYCOLAX) 17 g packet Take 17 g by mouth daily as needed for mild constipation.      ranolazine (RANEXA) 500 MG 12 hr tablet TAKE ONE TABLET BY MOUTH TWICE DAILY (Patient taking differently: Take 500 mg by mouth 2 (two) times daily.) 180 tablet 2   rosuvastatin (CRESTOR) 5 MG tablet TAKE ONE TABLET BY MOUTH ONCE DAILY (Patient taking differently: Take 5 mg by mouth daily.) 90 tablet 0   tamsulosin (FLOMAX) 0.4 MG CAPS capsule Take 1 capsule (0.4 mg total) by mouth daily after supper. 30 capsule 11   torsemide (DEMADEX) 20 MG tablet Take 2 tablets (40 mg total) by mouth daily. 60 tablet 3   traMADol (ULTRAM) 50 MG tablet Take 1-2 tablets (50-100 mg total) by mouth at bedtime as needed. 30 tablet 2   TRULICITY 1.5 XI/3.3AS SOPN Inject 1.5 mg into the skin once a week. 6 mL 3   warfarin (COUMADIN) 3 MG tablet TAKE 1 TO 1 & 1/2 TABLETS BY MOUTH AS DIRECTED BY THE COUMADIN CLINIC 90 tablet 0   No current facility-administered medications for this visit.    Allergies  Allergen Reactions   Clopidogrel Anaphylaxis and Other (See Comments)    altered mental status;  (electrolytes "out of wack" and confusion per family; THEY DO NOT REMEMBER ANY OTHER REACTION)- MSB 10/10/15   Ciprofloxacin Itching   Beta Adrenergic Blockers      Nightmares   Mirtazapine Diarrhea    Bad dreams    Spironolactone Other (See Comments)    gynecomastia    Family History  Problem Relation Age of Onset   Brain cancer Father    Diabetes Sister    Heart attack Sister     Social History   Socioeconomic History  Marital status: Married    Spouse name: Not on file   Number of children: Not on file   Years of education: Not on file   Highest education level: 7th grade  Occupational History   Occupation: Retired    Comment: Owned his own [employer].  Tobacco Use   Smoking status: Former    Packs/day: 1.75    Years: 20.00    Pack years: 35.00    Types: Cigarettes    Start date: 72    Quit date: 1975    Years since quitting: 48.0   Smokeless tobacco: Former  Scientific laboratory technician Use: Never used  Substance and Sexual Activity   Alcohol use: No    Comment: previously drank heavily - quit 1979.   Drug use: No   Sexual activity: Never  Other Topics Concern   Not on file  Social History Narrative   Working on transportation truck part time    Social Determinants of Radio broadcast assistant Strain: Low Risk    Difficulty of Paying Living Expenses: Not hard at all  Food Insecurity: No Food Insecurity   Worried About Charity fundraiser in the Last Year: Never true   Arboriculturist in the Last Year: Never true  Transportation Needs: No Transportation Needs   Lack of Transportation (Medical): No   Lack of Transportation (Non-Medical): No  Physical Activity: Inactive   Days of Exercise per Week: 0 days   Minutes of Exercise per Session: 0 min  Stress: No Stress Concern Present   Feeling of Stress : Not at all  Social Connections: Moderately Integrated   Frequency of Communication with Friends and Family: More than three times a week   Frequency of Social Gatherings with Friends and Family: More than three times a week   Attends Religious Services: More than 4 times per year   Active Member of [employer] or  Organizations: No   Attends Archivist Meetings: Never   Marital Status: Married  Human resources officer Violence: Not At Risk   Fear of Current or Ex-Partner: No   Emotionally Abused: No   Physically Abused: No   Sexually Abused: No     Constitutional: Denies fever, malaise, fatigue, headache or abrupt weight changes.  HEENT: Denies eye pain, eye redness, ear pain, ringing in the ears, wax buildup, runny nose, nasal congestion, bloody nose, or sore throat. Respiratory: Denies difficulty breathing, shortness of breath, cough or sputum production.   Cardiovascular: Denies chest pain, chest tightness, palpitations or swelling in the hands or feet.  Gastrointestinal: Patient reports burping, reflux, constipation and loose stools.  Denies abdominal pain, bloating, or blood in the stool.  GU: Denies urgency, frequency, pain with urination, burning sensation, blood in urine, odor or discharge. Musculoskeletal: Denies decrease in range of motion, difficulty with gait, muscle pain or joint pain and swelling.  Skin: Denies redness, rashes, lesions or ulcercations.  Neurological: Denies dizziness, difficulty with memory, difficulty with speech or problems with balance and coordination.  Psych: Denies anxiety, depression, SI/HI.  No other specific complaints in a complete review of systems (except as listed in HPI above).     Objective:   Physical Exam   There were no vitals taken for this visit. Wt Readings from Last 3 Encounters:  03/05/21 197 lb (89.4 kg)  02/23/21 197 lb 1.5 oz (89.4 kg)  02/14/21 197 lb (89.4 kg)    General: Appears their stated age, well developed, well nourished  in NAD. Skin: Warm, dry and intact. No rashes, lesions or ulcerations noted. HEENT: Head: normal shape and size; Eyes: sclera white, no icterus, conjunctiva pink, PERRLA and EOMs intact; Ears: Tm's gray and intact, normal light reflex; Nose: mucosa pink and moist, septum midline; Throat/Mouth: Teeth  present, mucosa pink and moist, no exudate, lesions or ulcerations noted.  Neck:  Neck supple, trachea midline. No masses, lumps or thyromegaly present.  Cardiovascular: Normal rate and rhythm. S1,S2 noted.  No murmur, rubs or gallops noted. No JVD or BLE edema. No carotid bruits noted. Pulmonary/Chest: Normal effort and positive vesicular breath sounds. No respiratory distress. No wheezes, rales or ronchi noted.  Abdomen: Soft and nontender. Normal bowel sounds. No distention or masses noted. Liver, spleen and kidneys non palpable. Musculoskeletal: Normal range of motion. No signs of joint swelling. No difficulty with gait.  Neurological: Alert and oriented. Cranial nerves II-XII grossly intact. Coordination normal.  Psychiatric: Mood and affect normal. Behavior is normal. Judgment and thought content normal.     BMET    Component Value Date/Time   NA 137 03/23/2021 1431   NA 134 06/29/2020 1411   NA 134 (L) 05/18/2012 0440   K 3.8 03/23/2021 1431   K 4.2 05/18/2012 0440   CL 108 03/23/2021 1431   CL 103 05/18/2012 0440   CO2 22 03/23/2021 1431   CO2 25 05/18/2012 0440   GLUCOSE 114 (H) 03/23/2021 1431   GLUCOSE 220 (H) 05/18/2012 0440   BUN 28 (H) 03/23/2021 1431   BUN 22 06/29/2020 1411   BUN 38 (H) 05/18/2012 0440   CREATININE 1.59 (H) 03/23/2021 1431   CREATININE 1.72 (H) 04/13/2020 1117   CALCIUM 8.7 (L) 03/23/2021 1431   CALCIUM 8.4 (L) 05/18/2012 0440   GFRNONAA 42 (L) 03/23/2021 1431   GFRNONAA 36 (L) 04/13/2020 1117   GFRAA 41 (L) 04/13/2020 1117    Lipid Panel     Component Value Date/Time   CHOL 123 06/29/2020 1411   TRIG 170 (H) 06/29/2020 1411   HDL 29 (L) 06/29/2020 1411   CHOLHDL 4.2 06/29/2020 1411   CHOLHDL 3.9 08/28/2018 1107   VLDL 31 08/28/2018 1107   LDLCALC 65 06/29/2020 1411    CBC    Component Value Date/Time   WBC 14.1 (H) 02/24/2021 0433   RBC 4.43 02/24/2021 0433   HGB 11.9 (L) 02/24/2021 0433   HGB 13.2 06/29/2020 1411   HCT 37.2  (L) 02/24/2021 0433   HCT 39.4 06/29/2020 1411   PLT 202 02/24/2021 0433   PLT 182 06/29/2020 1411   MCV 84.0 02/24/2021 0433   MCV 84 06/29/2020 1411   MCV 92 05/18/2012 0440   MCH 26.9 02/24/2021 0433   MCHC 32.0 02/24/2021 0433   RDW 15.3 02/24/2021 0433   RDW 15.6 (H) 06/29/2020 1411   RDW 15.3 (H) 05/18/2012 0440   LYMPHSABS 1.2 02/21/2021 1127   LYMPHSABS 1.8 08/23/2019 1544   LYMPHSABS 2.4 03/16/2012 0932   MONOABS 1.0 02/21/2021 1127   MONOABS 1.6 (H) 03/16/2012 0932   EOSABS 0.1 02/21/2021 1127   EOSABS 0.2 08/23/2019 1544   EOSABS 0.1 03/16/2012 0932   BASOSABS 0.0 02/21/2021 1127   BASOSABS 0.1 08/23/2019 1544   BASOSABS 4 05/18/2012 0440   BASOSABS 0.0 03/16/2012 0932    Hgb A1C Lab Results  Component Value Date   HGBA1C 7.3 (H) 02/21/2021           Assessment & Plan:   Lucas Silversmith, NP This visit occurred  during the SARS-CoV-2 public health emergency.  Safety protocols were in place, including screening questions prior to the visit, additional usage of staff PPE, and extensive cleaning of exam room while observing appropriate contact time as indicated for disinfecting solutions.

## 2021-04-05 NOTE — Telephone Encounter (Signed)
°  Chief Complaint: indigestion, constipation, burping Symptoms: patient states he used Miralax last night- only had watery stool Frequency: 3-4 days Pertinent Negatives: Patient denies chest pain, breathing difficultly  Disposition: [] ED /[] Urgent Care (no appt availability in office) / [x] Appointment(In office/virtual)/ []  [facility]/ [] Home Care/ [] Refused Recommended Disposition /[]  Mobile Bus/ []  Follow-up with PCP Additional Notes:

## 2021-04-05 NOTE — Telephone Encounter (Signed)
Summary: Burping/Indigestion -seeking sooner appt   Pt's wife called reporting that the patient is unable to sleep because of burping/indigestion. She declined the next available appt, states the patient needs to be seen sooner. This has lasted for 3 days  Best contact: [phone]     Reason for Disposition  [1] MILD-MODERATE pain AND [2] not relieved by antacids  Answer Assessment - Initial Assessment Questions 1. LOCATION: "Where does it hurt?"      Midline upper discomfort 2. RADIATION: "Does the pain shoot anywhere else?" (e.g., chest, back)     no 3. ONSET: "When did the pain begin?" (e.g., minutes, hours or days ago)      3-4 days 4. SUDDEN: "Gradual or sudden onset?"     gradual 5. PATTERN "Does the pain come and go, or is it constant?"    - If constant: "Is it getting better, staying the same, or worsening?"      (Note: Constant means the pain never goes away completely; most serious pain is constant and it progresses)     - If intermittent: "How long does it last?" "Do you have pain now?"     (Note: Intermittent means the pain goes away completely between bouts)     Constant- constipation- patient was using Miralax- watery stool- last night 6. SEVERITY: "How bad is the pain?"  (e.g., Scale 1-10; mild, moderate, or severe)    - MILD (1-3): doesn't interfere with normal activities, abdomen soft and not tender to touch     - MODERATE (4-7): interferes with normal activities or awakens from sleep, abdomen tender to touch     - SEVERE (8-10): excruciating pain, doubled over, unable to do any normal activities       mild 7. RECURRENT SYMPTOM: "Have you ever had this type of stomach pain before?" If Yes, ask: "When was the last time?" and "What happened that time?"      no 8. AGGRAVATING FACTORS: "Does anything seem to cause this pain?" (e.g., foods, stress, alcohol)     *No Answer* 9. CARDIAC SYMPTOMS: "Do you have any of the following symptoms: chest pain, difficulty breathing,  sweating, nausea?"     no 10. OTHER SYMPTOMS: "Do you have any other symptoms?" (e.g., back pain, diarrhea, fever, urination pain, vomiting)       *No Answer* 11. PREGNANCY: "Is there any chance you are pregnant?" "When was your last menstrual period?"       *No Answer*  Protocols used: Abdominal Pain - Upper-A-AH

## 2021-04-10 DIAGNOSIS — Z89411 Acquired absence of right great toe: Secondary | ICD-10-CM | POA: Diagnosis not present

## 2021-04-10 DIAGNOSIS — B351 Tinea unguium: Secondary | ICD-10-CM | POA: Diagnosis not present

## 2021-04-10 DIAGNOSIS — L97514 Non-pressure chronic ulcer of other part of right foot with necrosis of bone: Secondary | ICD-10-CM | POA: Diagnosis not present

## 2021-04-10 DIAGNOSIS — E1142 Type 2 diabetes mellitus with diabetic polyneuropathy: Secondary | ICD-10-CM | POA: Diagnosis not present

## 2021-04-10 DIAGNOSIS — M86171 Other acute osteomyelitis, right ankle and foot: Secondary | ICD-10-CM | POA: Diagnosis not present

## 2021-04-11 ENCOUNTER — Ambulatory Visit: Payer: Self-pay

## 2021-04-11 NOTE — Telephone Encounter (Signed)
Pt'wife stated that pt has had uncontrollable diarrhea since last week on and off.   No appointment available until Friday.   Seeking clinical advice.   Chief Complaint: Diarrhea Symptoms: Watery stools 4-5 x day Frequency: Started last week. Pertinent Negatives: Patient denies abdominal pain Disposition: [] ED /[] Urgent Care (no appt availability in office) / [x] Appointment(In office/virtual)/ []  [facility]/ [] Home Care/ [] Refused Recommended Disposition /[] [facility]/ []  Follow-up with PCP Additional Notes: Pt. Asking to be worked in tomorrow if possible. Has appointment Friday. Please advise pt.   Reason for Disposition  [1] MODERATE diarrhea (e.g., 4-6 times / day more than normal) AND [2] age > 70 years  Answer Assessment - Initial Assessment Questions 1. DIARRHEA SEVERITY: "How bad is the diarrhea?" "How many more stools have you had in the past 24 hours than normal?"    - NO DIARRHEA (SCALE 0)   - MILD (SCALE 1-3): Few loose or mushy BMs; increase of 1-3 stools over normal daily number of stools; mild increase in ostomy output.   -  MODERATE (SCALE 4-7): Increase of 4-6 stools daily over normal; moderate increase in ostomy output. * SEVERE (SCALE 8-10; OR 'WORST POSSIBLE'): Increase of 7 or more stools daily over normal; moderate increase in ostomy output; incontinence.     4-5 2. ONSET: "When did the diarrhea begin?"      Last week 3. BM CONSISTENCY: "How loose or watery is the diarrhea?"      Watery 4. VOMITING: "Are you also vomiting?" If Yes, ask: "How many times in the past 24 hours?"      No 5. ABDOMINAL PAIN: "Are you having any abdominal pain?" If Yes, ask: "What does it feel like?" (e.g., crampy, dull, intermittent, constant)      None 6. ABDOMINAL PAIN SEVERITY: If present, ask: "How bad is the pain?"  (e.g., Scale 1-10; mild, moderate, or severe)   - MILD (1-3): doesn't interfere with normal activities, abdomen soft and not tender to touch     - MODERATE (4-7): interferes with normal activities or awakens from sleep, abdomen tender to touch    - SEVERE (8-10): excruciating pain, doubled over, unable to do any normal activities       No 7. ORAL INTAKE: If vomiting, "Have you been able to drink liquids?" "How much liquids have you had in the past 24 hours?"     Eating well 8. HYDRATION: "Any signs of dehydration?" (e.g., dry mouth [not just dry lips], too weak to stand, dizziness, new weight loss) "When did you last urinate?"     No 9. EXPOSURE: "Have you traveled to a foreign country recently?" "Have you been exposed to anyone with diarrhea?" "Could you have eaten any food that was spoiled?"     No 10. ANTIBIOTIC USE: "Are you taking antibiotics now or have you taken antibiotics in the past 2 months?"       No 11. OTHER SYMPTOMS: "Do you have any other symptoms?" (e.g., fever, blood in stool)       No 12. PREGNANCY: "Is there any chance you are pregnant?" "When was your last menstrual period?"       N/a  Protocols used: [facility]

## 2021-04-11 NOTE — Telephone Encounter (Signed)
No availability for tomorrow and it is Dr. Raliegh Ip half day.

## 2021-04-12 DIAGNOSIS — Z89411 Acquired absence of right great toe: Secondary | ICD-10-CM | POA: Diagnosis not present

## 2021-04-12 DIAGNOSIS — D509 Iron deficiency anemia, unspecified: Secondary | ICD-10-CM | POA: Diagnosis not present

## 2021-04-12 DIAGNOSIS — I480 Paroxysmal atrial fibrillation: Secondary | ICD-10-CM | POA: Diagnosis not present

## 2021-04-12 DIAGNOSIS — E871 Hypo-osmolality and hyponatremia: Secondary | ICD-10-CM | POA: Diagnosis not present

## 2021-04-12 DIAGNOSIS — N400 Enlarged prostate without lower urinary tract symptoms: Secondary | ICD-10-CM | POA: Diagnosis not present

## 2021-04-12 DIAGNOSIS — N184 Chronic kidney disease, stage 4 (severe): Secondary | ICD-10-CM | POA: Diagnosis not present

## 2021-04-12 DIAGNOSIS — C9210 Chronic myeloid leukemia, BCR/ABL-positive, not having achieved remission: Secondary | ICD-10-CM | POA: Diagnosis not present

## 2021-04-12 DIAGNOSIS — I255 Ischemic cardiomyopathy: Secondary | ICD-10-CM | POA: Diagnosis not present

## 2021-04-12 DIAGNOSIS — I4510 Unspecified right bundle-branch block: Secondary | ICD-10-CM | POA: Diagnosis not present

## 2021-04-12 DIAGNOSIS — M1990 Unspecified osteoarthritis, unspecified site: Secondary | ICD-10-CM | POA: Diagnosis not present

## 2021-04-12 DIAGNOSIS — G43909 Migraine, unspecified, not intractable, without status migrainosus: Secondary | ICD-10-CM | POA: Diagnosis not present

## 2021-04-12 DIAGNOSIS — D631 Anemia in chronic kidney disease: Secondary | ICD-10-CM | POA: Diagnosis not present

## 2021-04-12 DIAGNOSIS — I5042 Chronic combined systolic (congestive) and diastolic (congestive) heart failure: Secondary | ICD-10-CM | POA: Diagnosis not present

## 2021-04-12 DIAGNOSIS — G4733 Obstructive sleep apnea (adult) (pediatric): Secondary | ICD-10-CM | POA: Diagnosis not present

## 2021-04-12 DIAGNOSIS — Z4781 Encounter for orthopedic aftercare following surgical amputation: Secondary | ICD-10-CM | POA: Diagnosis not present

## 2021-04-12 DIAGNOSIS — I25118 Atherosclerotic heart disease of native coronary artery with other forms of angina pectoris: Secondary | ICD-10-CM | POA: Diagnosis not present

## 2021-04-12 DIAGNOSIS — J432 Centrilobular emphysema: Secondary | ICD-10-CM | POA: Diagnosis not present

## 2021-04-12 DIAGNOSIS — E1122 Type 2 diabetes mellitus with diabetic chronic kidney disease: Secondary | ICD-10-CM | POA: Diagnosis not present

## 2021-04-12 DIAGNOSIS — M86071 Acute hematogenous osteomyelitis, right ankle and foot: Secondary | ICD-10-CM | POA: Diagnosis not present

## 2021-04-12 DIAGNOSIS — I130 Hypertensive heart and chronic kidney disease with heart failure and stage 1 through stage 4 chronic kidney disease, or unspecified chronic kidney disease: Secondary | ICD-10-CM | POA: Diagnosis not present

## 2021-04-12 DIAGNOSIS — H5461 Unqualified visual loss, right eye, normal vision left eye: Secondary | ICD-10-CM | POA: Diagnosis not present

## 2021-04-12 DIAGNOSIS — I951 Orthostatic hypotension: Secondary | ICD-10-CM | POA: Diagnosis not present

## 2021-04-12 DIAGNOSIS — E1169 Type 2 diabetes mellitus with other specified complication: Secondary | ICD-10-CM | POA: Diagnosis not present

## 2021-04-12 DIAGNOSIS — M1030 Gout due to renal impairment, unspecified site: Secondary | ICD-10-CM | POA: Diagnosis not present

## 2021-04-12 DIAGNOSIS — D630 Anemia in neoplastic disease: Secondary | ICD-10-CM | POA: Diagnosis not present

## 2021-04-13 ENCOUNTER — Other Ambulatory Visit: Payer: Self-pay

## 2021-04-13 ENCOUNTER — Ambulatory Visit (INDEPENDENT_AMBULATORY_CARE_PROVIDER_SITE_OTHER): Payer: Medicare HMO | Admitting: Family Medicine

## 2021-04-13 ENCOUNTER — Ambulatory Visit: Payer: Medicare HMO | Admitting: Family Medicine

## 2021-04-13 ENCOUNTER — Encounter: Payer: Self-pay | Admitting: Family Medicine

## 2021-04-13 VITALS — BP 135/72 | HR 89 | Ht 63.0 in | Wt 197.0 lb

## 2021-04-13 DIAGNOSIS — R197 Diarrhea, unspecified: Secondary | ICD-10-CM | POA: Diagnosis not present

## 2021-04-13 DIAGNOSIS — M12811 Other specific arthropathies, not elsewhere classified, right shoulder: Secondary | ICD-10-CM | POA: Diagnosis not present

## 2021-04-13 DIAGNOSIS — M75122 Complete rotator cuff tear or rupture of left shoulder, not specified as traumatic: Secondary | ICD-10-CM | POA: Diagnosis not present

## 2021-04-13 DIAGNOSIS — K219 Gastro-esophageal reflux disease without esophagitis: Secondary | ICD-10-CM

## 2021-04-13 DIAGNOSIS — M75121 Complete rotator cuff tear or rupture of right shoulder, not specified as traumatic: Secondary | ICD-10-CM | POA: Diagnosis not present

## 2021-04-13 DIAGNOSIS — M12812 Other specific arthropathies, not elsewhere classified, left shoulder: Secondary | ICD-10-CM | POA: Diagnosis not present

## 2021-04-13 MED ORDER — FAMOTIDINE 40 MG PO TABS: 40.0000 mg | ORAL_TABLET | Freq: Two times a day (BID) | ORAL | 3 refills | Status: DC

## 2021-04-13 NOTE — Patient Instructions (Addendum)
Thank you for coming to the office today.  OTC Peppermint Oil (Triple Coated Capsule) 180mg  take one 3 times daily to reduce diarrhea  Imodium AD as needed for diarrhea.  Limit the caffeine intake green tea and greasy/fried foods  Stop Omeprazole 20mg  x 2 and switch to new med. This was interacting with the cancer medicine.  Famotidine 40mg  TWICE a day every day for stomach acid.   Please schedule a Follow-up Appointment to: Return in about 3 months (around 07/12/2021) for 3 month follow up DM A1c.  If you have any other questions or concerns, please feel free to call the office or send a message through [facility]. You may also schedule an earlier appointment if necessary.  Additionally, you may be receiving a survey about your experience at our office within a few days to 1 week by e-mail or mail. We value your feedback.  [name], DO [facility]

## 2021-04-13 NOTE — Progress Notes (Signed)
Subjective:    Patient ID: Lucas Caldwell, male    DOB: 1933-07-04, 88 y.o.   MRN: [id number]  Lucas Caldwell is a 88 y.o. male presenting on 04/13/2021 for Diarrhea   HPI  Diarrhea GERD Recent acute onset past several days, loose stool, took OTC Imodium temporary relief only. Admits drinking more green tea and also fried/greasy food that seemed to trigger Admits GERD reflux symptoms, taking Omeprazole 20mg  x 2 for 40mg  OTC , however upon further review has med interaction No abdominal pain fever chills    Depression screen [facility] 2/9 04/13/2021 10/02/2020 07/28/2020  Decreased Interest 0 0 0  Down, Depressed, Hopeless 0 0 0  PHQ - 2 Score 0 0 0  Altered sleeping 2 - 2  Tired, decreased energy 0 - 0  Change in appetite 0 - 0  Feeling bad or failure about yourself  0 - 0  Trouble concentrating 0 - 0  Moving slowly or fidgety/restless 0 - 0  Suicidal thoughts 0 - 0  PHQ-9 Score 2 - 2  Difficult doing work/chores Not difficult at all - -  Some recent data might be hidden    Social History   Tobacco Use   Smoking status: Former    Packs/day: 1.75    Years: 20.00    Pack years: 35.00    Types: Cigarettes    Start date: 1955    Quit date: 1975    Years since quitting: 48.1   Smokeless tobacco: Former  Scientific laboratory technician Use: Never used  Substance Use Topics   Alcohol use: No    Comment: previously drank heavily - quit 1979.   Drug use: No    Review of Systems Per HPI unless specifically indicated above     Objective:    BP 135/72    Pulse 89    Ht 5\' 3"  (1.6 m)    Wt 197 lb (89.4 kg)    SpO2 98%    BMI 34.90 kg/m   Wt Readings from Last 3 Encounters:  04/13/21 197 lb (89.4 kg)  03/05/21 197 lb (89.4 kg)  02/23/21 197 lb 1.5 oz (89.4 kg)    Physical Exam Vitals and nursing note reviewed.  Constitutional:      General: [name] not in acute distress.    Appearance: Normal appearance. [name] well-developed. [name] not diaphoretic.     Comments:  Well-appearing, comfortable, cooperative  HENT:     Head: Normocephalic and atraumatic.  Eyes:     General:        Right eye: No discharge.        Left eye: No discharge.     Conjunctiva/sclera: Conjunctivae normal.  Cardiovascular:     Rate and Rhythm: Normal rate.  Pulmonary:     Effort: Pulmonary effort is normal.  Skin:    General: Skin is warm and dry.     Findings: No erythema or rash.  Neurological:     Mental Status: [name] alert and oriented to person, place, and time.  Psychiatric:        Mood and Affect: Mood normal.        Behavior: Behavior normal.        Thought Content: Thought content normal.     Comments: Well groomed, good eye contact, normal speech and thoughts   Results for orders placed or performed in visit on 04/04/21  POCT INR  Result Value Ref Range   INR 3.1 (A)  2.0 - 3.0   *Note: Due to a large number of results and/or encounters for the requested time period, some results have not been displayed. A complete set of results can be found in Results Review.      Assessment & Plan:   Problem List Items Addressed This Visit     GERD (gastroesophageal reflux disease) - Primary   Relevant Medications   famotidine (PEPCID) 40 MG tablet   Other Visit Diagnoses     Diarrhea, unspecified type           Diarrhea Prevention - OTC Peppermint Oil (Triple Coated Capsule) 180mg  take one 3 times daily to reduce diarrhea Imodium AD as needed for diarrhea. Limit the caffeine intake green tea and greasy/fried foods  GERD Stop Omeprazole 20mg  x 2 and switch to new med. This was interacting with the cancer medicine. START new rx Famotidine 40mg  TWICE a day every day for stomach acid.  Meds ordered this encounter  Medications   famotidine (PEPCID) 40 MG tablet    Sig: Take 1 tablet (40 mg total) by mouth 2 (two) times daily with a meal.    Dispense:  180 tablet    Refill:  3    Stop Omeprazole      Follow up plan: Return in about 3 months (around  07/12/2021) for 3 month follow up DM A1c.   [name], [facility] 04/13/2021, 11:18 AM

## 2021-04-16 ENCOUNTER — Other Ambulatory Visit (HOSPITAL_COMMUNITY): Payer: Self-pay | Admitting: *Deleted

## 2021-04-16 ENCOUNTER — Other Ambulatory Visit (HOSPITAL_COMMUNITY): Payer: Self-pay | Admitting: Internal Medicine

## 2021-04-16 DIAGNOSIS — M1A39X0 Chronic gout due to renal impairment, multiple sites, without tophus (tophi): Secondary | ICD-10-CM

## 2021-04-16 MED ORDER — ALLOPURINOL 100 MG PO TABS: 100.0000 mg | ORAL_TABLET | Freq: Every day | ORAL | 0 refills | Status: DC

## 2021-04-17 ENCOUNTER — Telehealth: Payer: Self-pay

## 2021-04-17 ENCOUNTER — Ambulatory Visit: Payer: Self-pay

## 2021-04-17 DIAGNOSIS — K219 Gastro-esophageal reflux disease without esophagitis: Secondary | ICD-10-CM

## 2021-04-17 DIAGNOSIS — E11621 Type 2 diabetes mellitus with foot ulcer: Secondary | ICD-10-CM | POA: Diagnosis not present

## 2021-04-17 DIAGNOSIS — E1121 Type 2 diabetes mellitus with diabetic nephropathy: Secondary | ICD-10-CM | POA: Diagnosis not present

## 2021-04-17 DIAGNOSIS — L97509 Non-pressure chronic ulcer of other part of unspecified foot with unspecified severity: Secondary | ICD-10-CM | POA: Diagnosis not present

## 2021-04-17 DIAGNOSIS — I10 Essential (primary) hypertension: Secondary | ICD-10-CM | POA: Diagnosis not present

## 2021-04-17 DIAGNOSIS — I5032 Chronic diastolic (congestive) heart failure: Secondary | ICD-10-CM | POA: Diagnosis not present

## 2021-04-17 DIAGNOSIS — Z794 Long term (current) use of insulin: Secondary | ICD-10-CM

## 2021-04-17 DIAGNOSIS — N1831 Chronic kidney disease, stage 3a: Secondary | ICD-10-CM | POA: Diagnosis not present

## 2021-04-17 DIAGNOSIS — I5023 Acute on chronic systolic (congestive) heart failure: Secondary | ICD-10-CM

## 2021-04-17 DIAGNOSIS — J432 Centrilobular emphysema: Secondary | ICD-10-CM

## 2021-04-17 DIAGNOSIS — E785 Hyperlipidemia, unspecified: Secondary | ICD-10-CM

## 2021-04-17 DIAGNOSIS — I25118 Atherosclerotic heart disease of native coronary artery with other forms of angina pectoris: Secondary | ICD-10-CM

## 2021-04-17 DIAGNOSIS — E1169 Type 2 diabetes mellitus with other specified complication: Secondary | ICD-10-CM

## 2021-04-17 DIAGNOSIS — R197 Diarrhea, unspecified: Secondary | ICD-10-CM

## 2021-04-17 NOTE — Telephone Encounter (Signed)
Copied from [place] [id number]. Topic: General - Other >> Apr 17, 2021 11:43 AM Erick Blinks wrote: Reason for CRM: Pt's wife called requesting to be contacted by Jeannene Patella,   Best contact: [phone]

## 2021-04-17 NOTE — Chronic Care Management (AMB) (Signed)
Chronic Care Management   CCM RN Visit Note  04/17/2021 Name: Lucas Caldwell MRN: [id number] DOB: Mar 06, 1934  Subjective: Lucas Caldwell is a 88 y.o. year old male who is a primary care patient of [name], DO. The care management team was consulted for assistance with disease management and care coordination needs.    Engaged with patient by telephone for follow up visit in response to provider referral for case management and/or care coordination services. Spoke to the patients wife and DRP, [name].   Consent to Services:  The patient was given information about Chronic Care Management services, agreed to services, and gave verbal consent prior to initiation of services.  Please see initial visit note for detailed documentation.   Patient agreed to services and verbal consent obtained.   Assessment: Review of patient past medical history, allergies, medications, health status, including review of consultants reports, laboratory and other test data, was performed as part of comprehensive evaluation and provision of chronic care management services.   SDOH (Social Determinants of Health) assessments and interventions performed:    CCM Care Plan  Allergies  Allergen Reactions   Clopidogrel Anaphylaxis and Other (See Comments)    altered mental status;  (electrolytes "out of wack" and confusion per family; THEY DO NOT REMEMBER ANY OTHER REACTION)- MSB 10/10/15   Ciprofloxacin Itching   Beta Adrenergic Blockers     Nightmares   Mirtazapine Diarrhea    Bad dreams    Spironolactone Other (See Comments)    gynecomastia    Outpatient Encounter Medications as of 04/17/2021  Medication Sig   acetaminophen (TYLENOL) 500 MG tablet Take 1,000 mg by mouth every 6 (six) hours as needed for moderate pain or headache.    Alcohol Swabs (B-D SINGLE USE SWABS REGULAR) PADS Use to check blood sugar up to 2 times daily   allopurinol (ZYLOPRIM) 100 MG tablet Take 1 tablet (100 mg  total) by mouth daily.   aspirin 81 MG chewable tablet Chew 81 mg by mouth daily.   Blood Glucose Monitoring Suppl (TRUE METRIX METER) DEVI Use to check blood sugar up to 2 times daily   bosutinib (BOSULIF) 100 MG tablet Take 100 mg by mouth at bedtime. Take with food.   Cholecalciferol 25 MCG (1000 UT) capsule Take 1,000 Units by mouth daily.   famotidine (PEPCID) 40 MG tablet Take 1 tablet (40 mg total) by mouth 2 (two) times daily with a meal.   gabapentin (NEURONTIN) 300 MG capsule Take 300 mg by mouth 3 (three) times daily.   Insulin Pen Needle 31G X 5 MM MISC 31 g by Does not apply route as directed.   isosorbide mononitrate (IMDUR) 60 MG 24 hr tablet Take 0.5 tablets (30 mg total) by mouth daily.   JARDIANCE 10 MG TABS tablet Take 1 tablet (10 mg total) by mouth daily with breakfast.   levothyroxine (SYNTHROID) 25 MCG tablet TAKE ONE TABLET BY MOUTH EVERY MORNING BEFORE BREAKFAST (Patient taking differently: Take 25 mcg by mouth daily before breakfast.)   losartan (COZAAR) 25 MG tablet Take 12.5 mg by mouth daily.   Menthol-Zinc Oxide (GOLD BOND EX) Apply 1 application topically daily as needed (muscle pain).   Multiple Vitamin (MULTIVITAMIN WITH MINERALS) TABS tablet Take 1 tablet by mouth daily.   nitroGLYCERIN (NITROSTAT) 0.4 MG SL tablet Place 1 tablet (0.4 mg total) under the tongue every 5 (five) minutes x 3 doses as needed for chest pain.   Polyethyl Glycol-Propyl Glycol (SYSTANE OP) Place  1 drop into both eyes 3 (three) times daily as needed (dry/irritated eyes.).    polyethylene glycol (MIRALAX / GLYCOLAX) 17 g packet Take 17 g by mouth daily as needed for mild constipation.    ranolazine (RANEXA) 500 MG 12 hr tablet TAKE ONE TABLET BY MOUTH TWICE DAILY (Patient taking differently: Take 500 mg by mouth 2 (two) times daily.)   rosuvastatin (CRESTOR) 5 MG tablet TAKE ONE TABLET BY MOUTH ONCE DAILY (Patient taking differently: Take 5 mg by mouth daily.)   tamsulosin (FLOMAX) 0.4 MG  CAPS capsule Take 1 capsule (0.4 mg total) by mouth daily after supper.   torsemide (DEMADEX) 20 MG tablet Take 2 tablets (40 mg total) by mouth daily.   traMADol (ULTRAM) 50 MG tablet Take 1-2 tablets (50-100 mg total) by mouth at bedtime as needed.   TRULICITY 1.5 KP/5.3ZS SOPN Inject 1.5 mg into the skin once a week.   warfarin (COUMADIN) 3 MG tablet TAKE 1 TO 1 & 1/2 TABLETS BY MOUTH AS DIRECTED BY THE COUMADIN CLINIC   No facility-administered encounter medications on file as of 04/17/2021.    Patient Active Problem List   Diagnosis Date Noted   Amputated great toe, right ([place]) 03/05/2021   Acute hematogenous osteomyelitis ([place])    Cellulitis of foot    Diabetic foot infection ([place])    Diabetic foot ulcer ([place]) 02/21/2021   Diabetic polyneuropathy associated with type 2 diabetes mellitus ([place]) 11/23/2020   Orthostatic hypotension 10/16/2020   Obstructive sleep apnea 06/29/2020   Urge incontinence of urine 12/22/2019   Trifascicular block 10/20/2019   Wound infection 10/08/2019   Bifascicular block 05/13/2019   Coronary stent patent 04/13/2019   PSVT (paroxysmal supraventricular tachycardia) ([place]) 03/19/2019   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 ([place]) 08/24/2018   CKD (chronic kidney disease), stage IV ([place]) 06/04/2018   Osteoarthritis of knees, bilateral 06/02/2018   Hyperlipidemia associated with type 2 diabetes mellitus ([place]) 05/06/2018   Pain of lower extremity 03/06/2018   Duodenitis 01/30/2018   Hematochezia    Melena    Rectal bleeding 01/11/2018   AVM (arteriovenous malformation) of colon without hemorrhage    Schatzki's ring    Esophageal dysphagia    Diabetic retinopathy ([place]) 07/08/2017   Ischemic cardiomyopathy 06/26/2017   Cardiomyopathy ([place]) 06/26/2017   Centrilobular emphysema ([place]) 06/13/2017   Fatigue 12/18/2016   Stable angina ([place]) 09/20/2016   Stage 3a chronic kidney disease ([place]) 09/20/2016   Paroxysmal atrial fibrillation ([place])  08/28/2016   Chronic anticoagulation 08/28/2016   Essential hypertension 08/20/2016   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 heart with stable angina pectoris ([place]) 08/19/2016   Accelerating angina ([place]) 08/15/2016   Exertional dyspnea 08/12/2016   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[place]) 12/29/2015   Osteomyelitis of great toe of right foot ([place]) 10/03/2015   Ulcerated, foot, right, with necrosis of bone ([place]) 09/26/2015   Gynecomastia, male 03/31/2014   Hypertrophy of breast 03/31/2014   Nuclear sclerosis of left eye 10/22/2013   Enthesopathy of ankle and tarsus 04/10/2013   Controlled type 2 diabetes mellitus with diabetic nephropathy, with long-term current use of insulin ([place]) 04/09/2013   Diabetic neuropathy associated with type 2 diabetes mellitus ([place]) 04/09/2013   Cataract of left eye 11/25/2011   Corneal opacity 09/30/2011   Pseudophakia of right eye 09/30/2011   GERD (gastroesophageal reflux disease) 09/25/2011   Gout 09/25/2011   Hyperlipidemia LDL goal <70 09/25/2011   Status post cataract extraction 04/11/2011  Cataract extraction status of eye 04/11/2011   Iron deficiency anemia 04/01/2011   Benign localized hyperplasia of prostate without urinary obstruction and other lower urinary tract symptoms (LUTS) 12/10/2010   Enlarged prostate without lower urinary tract symptoms (luts) 12/10/2010   Colon cancer screening 10/11/2010   Hypothyroidism 10/11/2010   Hyperlipidemia 09/20/2003   Essential and other specified forms of tremor 01/05/2001   Essential tremor 01/05/2001    Conditions to be addressed/monitored:CAD, DMII, and GERD  Care Plan : RNCM: General Plan of Care (Adult) for Chronic Disease Management and Care Cooerdination Needs  Updates made by Vanita Ingles, RN since 04/17/2021 12:00 AM     Problem: RNCM: Development of Plan of Care for Chronic Disease Management (HF, CAD, HTN,HLD, DM, COPD, CKD3, Chronic Pain)   Priority: High     Long-Range Goal: RNCM:  Effective Management  of Plan of Care for Chronic Disease Management (HF, CAD, HTN,HLD, DM, COPD, CKD3, Chronic Pain)   Start Date: 01/22/2021  Expected End Date: 01/22/2022  Priority: High  Note:   Current Barriers:  Knowledge Deficits related to plan of care for management of CHF, CAD, HTN, HLD, COPD, DMII, CKD Stage 3, and chronic pain  Care Coordination needs related to Level of care concerns and multiple chronic conditions  Chronic Disease Management support and education needs related to CHF, CAD, HTN, HLD, COPD, DMII, CKD Stage 3, and chronic pain   RNCM Clinical Goal(s):  Patient will verbalize understanding of plan for management of CHF, CAD, HTN, HLD, COPD, DMII, CKD Stage 3, and Osteoarthritis as evidenced by understanding the plan of care, calling the provider for changes, and working with the CCM to optimize health and well being for effective management of chronic conditions take all medications exactly as prescribed and will call provider for medication related questions as evidenced by taking medications as prescribed, contacting providers for questions about medications, and working with the pharm D to manage polypharmacy and medications concerns     attend all scheduled medical appointments: Saw pcp on 04-13-2021. The patients wife calls the office for changes and recommendations as evidenced by by keeping appointments with pcp and other specialist and calling for new appointments needed or questions        demonstrate improved and ongoing adherence to prescribed treatment plan for CHF, CAD, HTN, HLD, COPD, DMII, CKD Stage 3, and Osteoarthritis as evidenced by working with the CCM team to optimize health and well being  demonstrate a decrease in CHF, CAD, HTN, HLD, COPD, DMII, CKD Stage 3, and Osteoarthritis exacerbations  as evidenced by calling the provider for changes in conditions, stable chronic conditions and working with the CCM team to effectively manage health and well being   demonstrate ongoing self health care management ability for effective management of chronic conditions  as evidenced by  working with the CCM team  through collaboration with Consulting civil engineer, provider, and care team.   Interventions: 1:1 collaboration with primary care provider regarding development and update of comprehensive plan of care as evidenced by provider attestation and co-signature Inter-disciplinary care team collaboration (see longitudinal plan of care) Evaluation of current treatment plan related to  self management and patient's adherence to plan as established by provider   SDOH Barriers (Status: Goal on Track (progressing): YES.) Long Term Goal  Patient interviewed and SDOH assessment performed        Patient interviewed and appropriate assessments performed Provided patient with information about resources in [facility] and care guides available  for assistance with changes in SDOH, questions or concerns Discussed plans with patient for ongoing care management follow up and provided patient with direct contact information for care management team Advised patient to call the office for changes in SDOH, new concerns or questions  Provided education to patient/caregiver regarding level of care options.    CAD  (Status: Goal on Track (progressing): YES.) Long Term Goal  Assessed understanding of CAD diagnosis Medications reviewed including medications utilized in CAD treatment plan Provided education on importance of blood pressure control in management of CAD; Provided education on Importance of limiting foods high in cholesterol. 04-17-2021: Review of dietary intake and patient is compliant with heart healthy/ADA diet Counseled on importance of regular laboratory monitoring as prescribed; Reviewed Importance of taking all medications as prescribed. 03-21-2021: Review of medications and patient is compliant with medications. Denies any medication needs or changes Reviewed  Importance of attending all scheduled provider appointments. 03-21-2021: [place] appointments, calls for changes and needs Advised to report any changes in symptoms or exercise tolerance Screening for signs and symptoms of depression related to chronic disease state;  Assessed social determinant of health barriers;   Heart Failure Interventions:  (Status: Goal on Track (progressing): YES.)  Long Term Goal  Basic overview and discussion of pathophysiology of Heart Failure reviewed; Provided education on low sodium diet. 03-21-2021: The patient is compliant with heart healthy/ADA diet. Is not feeling the best today because of some diarrhea but wife states he is eating and drinking well; Reviewed Heart Failure Action Plan in depth and provided written copy; Assessed need for readable accurate scales in home. 03-21-2021: Participates in the HF program and weighs daily on a provided mat that send readings into the HF clinic for review and recommendations is any. Provided education about placing scale on hard, flat surface; Advised patient to weigh each morning after emptying bladder; Discussed importance of daily weight and advised patient to weigh and record daily; Reviewed role of diuretics in prevention of fluid overload and management of heart failure; Discussed the importance of keeping all appointments with provider; Provided patient with education about the role of exercise in the management of heart failure; Advised patient to discuss torsemide dose  with provider.  01-22-2021: The patients wife states that she has not heard back from the heart failure clinic on the Torsemide dose. Advised the patients wife to call the HF clinic back and request clarification on Torsemide dose. The patients wife verbalized understanding. She states he is not having any issues with swelling but he is having a problem with "holding his water" and its been going on for a while. 01-24-2021: The pharm D is also assisting with  getting in touch with the cardiologist/HF clinic to get clarification of medication dosage. 03-21-2021: The patient is taking medications as directed. Denies any issues with medication compliance.   Chronic Kidney Disease (Status: Goal on Track (progressing): YES.)  Long Term Goal  Last practice recorded BP readings:  BP Readings from Last 3 Encounters:  04/13/21 135/72  02/24/21 124/66  02/14/21 110/62  Most recent eGFR/CrCl:  Lab Results  Component Value Date   EGFR 44 (L) 06/29/2020    No components found for: CRCL  Assessed the patient   and patients wife for   understanding of chronic kidney disease    Evaluation of current treatment plan related to chronic kidney disease self management and patient's adherence to plan as established by provider . 01-22-2021: The patients wife states the patient is having  issues with "holding his water". States the urologist gave him a pill but it does not help. The patient is taking myrbetriq but the patients wife says he cannot tell where it helps him. Advised the patients wife to follow up with urologist, will also collaborate with the pcp for any other additional recommendations. Advised the patients wife with the multiple medications he takes and his chronic conditions that this is something that may be a little harder to manage.  03-21-2021: The patient is doing well with management of his CKD. The patients wife states there are no new concerns at this time with kidney function.   Provided education to patient re: stroke prevention, s/s of heart attack and stroke    Reviewed prescribed diet heart healthy/ADA diet  Reviewed medications with patient and discussed importance of compliance    Advised patient, providing education and rationale, to monitor blood pressure daily and record, calling PCP for findings outside established parameters    Discussed complications of poorly controlled blood pressure such as heart disease, stroke, circulatory complications,  vision complications, kidney impairment, sexual dysfunction    Advised patient to discuss urinary incontinence and frequency  with provider    Discussed plans with patient for ongoing care management follow up and provided patient with direct contact information for care management team    Discussed the impact of chronic kidney disease on daily life and mental health and acknowledged and normalized feelings of disempowerment, fear, and frustration    Engage patient in early, proactive and ongoing discussion about goals of care and what matters most to them      COPD: (Status: Goal on Track (progressing): YES.) Long Term Goal  Reviewed medications with patient, including use of prescribed maintenance and rescue inhalers, and provided instruction on medication management and the importance of adherence. 03-21-2021: Is compliant with medications Provided patient with basic written and verbal COPD education on self care/management/and exacerbation prevention; Advised patient to track and manage COPD triggers. 03-21-2021: The patient and patients wife are aware of triggers that cause COPD exacerbation. The patient has some GI symptoms going on but is not having any respiratory sx and sx of infection. Education and support given;  Provided written and verbal instructions on pursed lip breathing and utilized returned demonstration as teach back; Provided instruction about proper use of medications used for management of COPD including inhalers; Advised patient to self assesses COPD action plan zone and make appointment with provider if in the yellow zone for 48 hours without improvement; Advised patient to engage in light exercise as tolerated 3-5 days a week to aid in the the management of COPD; Provided education about and advised patient to utilize infection prevention strategies to reduce risk of respiratory infection; Discussed the importance of adequate rest and management of fatigue with  COPD;  Diabetes:  (Status: Goal on Track (progressing): YES.) Long Term Goal   Lab Results  Component Value Date   HGBA1C 7.3 (H) 02/21/2021  Assessed patient's understanding of A1c goal: <7% Provided education to patient about basic DM disease process; Reviewed medications with patient and discussed importance of medication adherence. 03-21-2021: The patients wife states that the patient is compliant with his medications. Denies any concerns with medications.  04-17-2021: The patients wife called the office and ask the staff to have RNCM call her. Call back to the patients wife. She needs assistance with paperwork that she received from [facility] care. She could not remember if it was "[facility] [name]" she needed to  talk to. The patient states that she does not know what to do with the letter. Education provided and will have the pharm D reach out to the patient. Secure message sent to pharm D for assistance with the needs of the patient. The patients wife is appreciative of the help.       Reviewed prescribed diet with patient heart healthy/ADA. 03-21-2021: Review of dietary restrictions and monitoring for foods high in sugars, sodium, and fats.  Counseled on importance of regular laboratory monitoring as prescribed. 03-21-2021: The patient has regular lab work for evaluation of A1C and glucose;        Discussed plans with patient for ongoing care management follow up and provided patient with direct contact information for care management team;      Provided patient with written educational materials related to hypo and hyperglycemia and importance of correct treatment. 03-21-2021: Denies any lows at this time. ;       Reviewed scheduled/upcoming provider appointments including: Saw pcp on 04-13-2021 Knows to call for changes ;         Advised patient, providing education and rationale, to check cbg as directed  and record. 03-21-2021: The patients wife states his blood sugars this am were 110. Range for blood  sugar readings have been 110 to 130. Review of goal of fasting blood sugars of <130 and post prandial of <180.        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 He has PT/OT and a nurse coming in the home to evaluate and monitor. The patient is walking with his walker and the patients wife denies any falls or safety concerns. The patient has a follow up with the provider on 04-10-2021. Education on sx and sx of infection and to call the office for changes at surgical site, questions or concerns.  04-17-2021: The patient was seen in the office on 04-13-2021 for evaluation of diarrhea he was having. Follow up today while discussing other needs and the patients wife states his diarrhea is better and denies any acute findings. Will continue to monitor.    Hyperlipidemia:  (Status: Goal on Track (progressing): YES.) Lab Results  Component Value Date   CHOL 123 06/29/2020   HDL 29 (L) 06/29/2020   LDLCALC 65 06/29/2020   TRIG 170 (H) 06/29/2020   CHOLHDL 4.2 06/29/2020     Medication review performed; medication list updated in electronic medical record.  Provider established cholesterol goals reviewed. 03-21-2021: Review of labs and the patient is compliant with regular lab work needs; Counseled on importance of regular laboratory monitoring as prescribed; Provided HLD educational materials; Reviewed role and benefits of statin for ASCVD risk reduction; Discussed strategies to manage statin-induced myalgias; Reviewed importance of limiting foods high in cholesterol. 03-21-2021: Review of heart healthy/ADA diet and foods to limit Reviewed exercise  goals and target of 150 minutes per week;  Hypertension: (Status: Goal on Track (progressing): YES.) Last practice recorded BP readings:  BP Readings from Last 3 Encounters:  02/24/21 124/66  02/14/21 110/62  02/01/21 110/64  Most recent eGFR/CrCl:  Lab Results  Component Value Date  EGFR 44 (L) 06/29/2020    No components found for: CRCL  Evaluation of current treatment plan related to hypertension self management and patient's adherence to plan as established by provider. 01-24-2021: The patient was eating breakfast. Reviewed with wife several upcoming appointments and follow ups scheduled.   03-21-2021: The patient is having more normal blood pressure readings. The patient denies any issues with HTN or heart health. Has a lot of chronic conditions impacting his care. Will continue to monitor for changes.  Provided education to patient re: stroke prevention, s/s of heart attack and stroke; Reviewed prescribed diet heart healthy/ADA Reviewed medications with patient and discussed importance of compliance;  Discussed plans with patient for ongoing care management follow up and provided patient with direct contact information for care management team; Advised patient, providing education and rationale, to monitor blood pressure daily and record, calling PCP for findings outside established parameters;  Advised patient to discuss blood pressure trends  with provider; Provided education on prescribed diet heart healthy/ADA diet ;  Discussed complications of poorly controlled blood pressure such as heart disease, stroke, circulatory complications, vision complications, kidney impairment, sexual dysfunction;   Pain:  (Status: Goal on Track (progressing): YES.) Long Term Goal  Pain assessment performed. 03-24-2020: the patient rates his pain in his bilateral shoulders at an 8 today on a scale of 0-10.  The patient states that he cannot raise his arms but so high. The patient is not taking any  medications for pain relief. Has not tried heat or cold applications. The patient states the injections he gets at the provider office usually help but they have not this time. The patient states that he is open to recommendations. 03-21-2021: The patient denies any pain today. The patient has upcoming imaging to evaluate shoulders.  Medications reviewed. 01-22-2021: The patient is currently not taking any medications for pain relief. Does have gabapentin that he takes on a consistent basis for neuropathy pain. Dosage in question concerning this as the neurologist has increased dose to 800 mg and renal dose recommends dose not to exceed 600 mg BID. Education with wife concerning medication changes. 03-21-2021: Is compliant with medications regimen.  Reviewed provider established plan for pain management. 01-22-2021: The patient and the patients wife ask if physical therapy would help with shoulder pain and discomfort. Will collaborate with the pcp on recommendations and let the patient and the patients wife know. 01-24-2021: Call made to [facility] on 01-23-2021 to ask about referral from pcp office. The staff states the referral was received and the patient has an appointment With Dr. Eliberto Ivory at 10 am on 02-14-2021.  Attempted to reach the patient or patients wife but was unsuccessful. Returned call today and explained to the wife the orthopedic appointment would address the patients shoulder pain and evaluate and recommend further treatment. Used teach back method for understanding of upcoming appointment with the orthopedic provider. Will continue to monitor. 03-21-2021: The patient is following the recommendations of the provider. The patient is seen on a consistent basis for evaluation and treatment options. Discussed importance of adherence to all scheduled medical appointments; Counseled on the importance of reporting any/all new or changed pain symptoms or management strategies to pain management  provider; Advised patient to report to care team affect of pain on daily activities; Discussed use of relaxation techniques and/or diversional activities to assist with pain reduction (distraction, imagery, relaxation, massage, acupressure, TENS, heat, and cold application; Reviewed with patient prescribed pharmacological and nonpharmacological pain relief strategies; Advised  patient to discuss unresolved pain, changes in level and intensity of pain with provider;   GERD/Acid reflux  (Status: New goal.) Long Term Goal  Evaluation of current treatment plan related to GERD,  self-management and patient's adherence to plan as established by provider. 03-21-2021: During outreach today the patients wife states the patient for the last 3 days has had a "sour burp" and not feeling the best with episodes of diarrhea x 3 to 4 times during the night. Denies fever or chills. Is tolerating food and beverages without difficulty. Secure message sent to pcp and CMA for recommendations. Call made back to the wife with instructions from Dr. Parks Ranger. 04-17-2021: The patient denies any issues with GERD and Diarrhea he was seen in the office for on 04-13-2021 has resolved.  Discussed plans with patient for ongoing care management follow up and provided patient with direct contact information for care management team Advised patient to call the office if symptoms of GERD/ACID reflux do not improve with recommended changes; Provided education to patient re: for the patient at the direction of pcp to increase his omeprazole dose from 20 mg daily to 40 mg daily. Used the teach back method to make sure the patients wife understood the instructions given. The patients wife wrote down instructions and repeated instructions back to [facility]; Reviewed medications with patient and discussed 03-21-2021: Advised the patients wife to only use imodium for severe diarrhea. Also advised per instructions from Dr. Parks Ranger that is was okay to  take gas-x and pepto bismol as needed. Also to increase Omeprazole dose from 43m daily to 40 mg daily; Collaborated with PCP, CMA, and pharm D regarding new complaint of GI symptoms. Recommendations received and the wife called back with instructions. ; Provided patient with instructions from pcp for new onset of GI symptoms x 3 days educational materials related to new onset of GI symptoms; Pharmacy referral for collaboration, ongoing support, and education for medications reconciliation and management ; Discussed plans with patient for ongoing care management follow up and provided patient with direct contact information for care management team; Advised patient to discuss unresolved GI condition with recommended changes, questions, concerns and sx and sx of worsening condition with provider;   Patient Goals/Self-Care Activities: Patient will self administer medications as prescribed as evidenced by self report/primary caregiver report  Patient will attend all scheduled provider appointments as evidenced by clinician review of documented attendance to scheduled appointments and patient/caregiver report Patient will call pharmacy for medication refills as evidenced by patient report and review of pharmacy fill history as appropriate Patient will attend church or other social activities as evidenced by patient report Patient will continue to perform ADL's independently as evidenced by patient/caregiver report Patient will continue to perform IADL's independently as evidenced by patient/caregiver report Patient will call provider office for new concerns or questions as evidenced by review of documented incoming telephone call notes and patient report Patient will work with BSW to address care coordination needs and will continue to work with the clinical team to address health care and disease management related needs as evidenced by documented adherence to scheduled care management/care coordination  appointments - call office if I gain more than 2 pounds in one day or 5 pounds in one week - keep legs up while sitting - track weight in diary - use salt in moderation - watch for swelling in feet, ankles and legs every day - weigh myself daily - develop a rescue plan - follow rescue plan if  symptoms flare-up - know when to call the doctor:for changes in HF sx and sx - track symptoms and what helps feel better or worse - dress right for the weather, hot or cold - schedule appointment with eye doctor - check blood sugar at prescribed times: before meals and at bedtime, when you have symptoms of low or high blood sugar, and before and after exercise - check feet daily for cuts, sores or redness - enter blood sugar readings and medication or insulin into daily log - take the blood sugar log to all doctor visits - trim toenails straight across - drink 6 to 8 glasses of water each day - eat fish at least once per week - fill half of plate with vegetables - limit fast food meals to no more than 1 per week - manage portion size - prepare main meal at home 3 to 5 days each week - read food labels for fat, fiber, carbohydrates and portion size - reduce red meat to 2 to 3 times a week - keep feet up while sitting - wash and dry feet carefully every day - wear comfortable, cotton socks - wear comfortable, well-fitting shoes Keep appointments with the podiatrist, next appointment on 01-24-2021 - avoid second hand smoke - eliminate smoking in my home - identify and remove indoor air pollutants - limit outdoor activity during cold weather - listen for public air quality announcements every day - do breathing exercises every day - develop a rescue plan - eliminate symptom triggers at home - follow rescue plan if symptoms flare-up - use an extra pillow to sleep - develop a new routine to improve sleep - don't eat or exercise right before bedtime - eat healthy/prescribed diet: heart  healthy/ADA diet  - get at least 7 to 8 hours of sleep at night - use devices that will help like a cane, sock-puller or reacher - practice relaxation or meditation daily - do exercises in a comfortable position that makes breathing as easy as possible - check blood pressure 3 times per week - choose a place to take my blood pressure (home, clinic or office, retail store) - write blood pressure results in a log or diary - learn about high blood pressure - keep a blood pressure log - take blood pressure log to all doctor appointments - call doctor for signs and symptoms of high blood pressure - develop an action plan for high blood pressure - keep all doctor appointments - take medications for blood pressure exactly as prescribed - report new symptoms to your doctor - eat more whole grains, fruits and vegetables, lean meats and healthy fats - call for medicine refill 2 or 3 days before it runs out - take all medications exactly as prescribed - call doctor with any symptoms you believe are related to your medicine - call doctor when you experience any new symptoms - go to all doctor appointments as scheduled - adhere to prescribed diet: heart healthy/ADA diet        Plan:Telephone follow up appointment with care management team member scheduled for:  04-19-2021 at 0900 am  Noreene Larsson RN, MSN, [place] Mobile: [phone]

## 2021-04-17 NOTE — Patient Instructions (Signed)
Visit Information  Thank you for taking time to visit with me today. Please don't hesitate to contact me if I can be of assistance to you before our next scheduled telephone appointment.  Following are the goals we discussed today:  RNCM Clinical Goal(s):  Patient will verbalize understanding of plan for management of CHF, CAD, HTN, HLD, COPD, DMII, CKD Stage 3, and Osteoarthritis as evidenced by understanding the plan of care, calling the provider for changes, and working with the CCM to optimize health and well being for effective management of chronic conditions take all medications exactly as prescribed and will call provider for medication related questions as evidenced by taking medications as prescribed, contacting providers for questions about medications, and working with the pharm D to manage polypharmacy and medications concerns     attend all scheduled medical appointments: Saw pcp on 04-13-2021. The patients wife calls the office for changes and recommendations as evidenced by by keeping appointments with pcp and other specialist and calling for new appointments needed or questions        demonstrate improved and ongoing adherence to prescribed treatment plan for CHF, CAD, HTN, HLD, COPD, DMII, CKD Stage 3, and Osteoarthritis as evidenced by working with the CCM team to optimize health and well being  demonstrate a decrease in CHF, CAD, HTN, HLD, COPD, DMII, CKD Stage 3, and Osteoarthritis exacerbations  as evidenced by calling the provider for changes in conditions, stable chronic conditions and working with the CCM team to effectively manage health and well being  demonstrate ongoing self health care management ability for effective management of chronic conditions  as evidenced by  working with the CCM team  through collaboration with Consulting civil engineer, provider, and care team.    Interventions: 1:1 collaboration with primary care provider regarding development and update of comprehensive  plan of care as evidenced by provider attestation and co-signature Inter-disciplinary care team collaboration (see longitudinal plan of care) Evaluation of current treatment plan related to  self management and patient's adherence to plan as established by provider     SDOH Barriers (Status: Goal on Track (progressing): YES.) Long Term Goal  Patient interviewed and SDOH assessment performed        Patient interviewed and appropriate assessments performed Provided patient with information about resources in [facility] and care guides available for assistance with changes in SDOH, questions or concerns Discussed plans with patient for ongoing care management follow up and provided patient with direct contact information for care management team Advised patient to call the office for changes in [place], new concerns or questions  Provided education to patient/caregiver regarding level of care options.       CAD  (Status: Goal on Track (progressing): YES.) Long Term Goal  Assessed understanding of CAD diagnosis Medications reviewed including medications utilized in CAD treatment plan Provided education on importance of blood pressure control in management of CAD; Provided education on Importance of limiting foods high in cholesterol. 04-17-2021: Review of dietary intake and patient is compliant with heart healthy/ADA diet Counseled on importance of regular laboratory monitoring as prescribed; Reviewed Importance of taking all medications as prescribed. 03-21-2021: Review of medications and patient is compliant with medications. Denies any medication needs or changes Reviewed Importance of attending all scheduled provider appointments. 03-21-2021: Carlos appointments, calls for changes and needs Advised to report any changes in symptoms or exercise tolerance Screening for signs and symptoms of depression related to chronic disease state;  Assessed social determinant of health barriers;  Heart  Failure Interventions:  (Status: Goal on Track (progressing): YES.)  Long Term Goal  Basic overview and discussion of pathophysiology of Heart Failure reviewed; Provided education on low sodium diet. 03-21-2021: The patient is compliant with heart healthy/ADA diet. Is not feeling the best today because of some diarrhea but wife states he is eating and drinking well; Reviewed Heart Failure Action Plan in depth and provided written copy; Assessed need for readable accurate scales in home. 03-21-2021: Participates in the HF program and weighs daily on a provided mat that send readings into the HF clinic for review and recommendations is any. Provided education about placing scale on hard, flat surface; Advised patient to weigh each morning after emptying bladder; Discussed importance of daily weight and advised patient to weigh and record daily; Reviewed role of diuretics in prevention of fluid overload and management of heart failure; Discussed the importance of keeping all appointments with provider; Provided patient with education about the role of exercise in the management of heart failure; Advised patient to discuss torsemide dose  with provider.  01-22-2021: The patients wife states that she has not heard back from the heart failure clinic on the Torsemide dose. Advised the patients wife to call the HF clinic back and request clarification on Torsemide dose. The patients wife verbalized understanding. She states he is not having any issues with swelling but he is having a problem with "holding his water" and its been going on for a while. 01-24-2021: The pharm D is also assisting with getting in touch with the cardiologist/HF clinic to get clarification of medication dosage. 03-21-2021: The patient is taking medications as directed. Denies any issues with medication compliance.    Chronic Kidney Disease (Status: Goal on Track (progressing): YES.)  Long Term Goal  Last practice recorded BP readings:      BP Readings from Last 3 Encounters:  04/13/21 135/72  02/24/21 124/66  02/14/21 110/62  Most recent eGFR/CrCl:       Lab Results  Component Value Date    EGFR 44 (L) 06/29/2020    No components found for: CRCL   Assessed the patient   and patients wife for   understanding of chronic kidney disease    Evaluation of current treatment plan related to chronic kidney disease self management and patient's adherence to plan as established by provider . 01-22-2021: The patients wife states the patient is having issues with "holding his water". States the urologist gave him a pill but it does not help. The patient is taking myrbetriq but the patients wife says he cannot tell where it helps him. Advised the patients wife to follow up with urologist, will also collaborate with the pcp for any other additional recommendations. Advised the patients wife with the multiple medications he takes and his chronic conditions that this is something that may be a little harder to manage.  03-21-2021: The patient is doing well with management of his CKD. The patients wife states there are no new concerns at this time with kidney function.   Provided education to patient re: stroke prevention, s/s of heart attack and stroke    Reviewed prescribed diet heart healthy/ADA diet  Reviewed medications with patient and discussed importance of compliance    Advised patient, providing education and rationale, to monitor blood pressure daily and record, calling PCP for findings outside established parameters    Discussed complications of poorly controlled blood pressure such as heart disease, stroke, circulatory complications, vision complications, kidney impairment, sexual dysfunction  Advised patient to discuss urinary incontinence and frequency  with provider    Discussed plans with patient for ongoing care management follow up and provided patient with direct contact information for care management team    Discussed the  impact of chronic kidney disease on daily life and mental health and acknowledged and normalized feelings of disempowerment, fear, and frustration    Engage patient in early, proactive and ongoing discussion about goals of care and what matters most to them        COPD: (Status: Goal on Track (progressing): YES.) Long Term Goal  Reviewed medications with patient, including use of prescribed maintenance and rescue inhalers, and provided instruction on medication management and the importance of adherence. 03-21-2021: Is compliant with medications Provided patient with basic written and verbal COPD education on self care/management/and exacerbation prevention; Advised patient to track and manage COPD triggers. 03-21-2021: The patient and patients wife are aware of triggers that cause COPD exacerbation. The patient has some GI symptoms going on but is not having any respiratory sx and sx of infection. Education and support given;  Provided written and verbal instructions on pursed lip breathing and utilized returned demonstration as teach back; Provided instruction about proper use of medications used for management of COPD including inhalers; Advised patient to self assesses COPD action plan zone and make appointment with provider if in the yellow zone for 48 hours without improvement; Advised patient to engage in light exercise as tolerated 3-5 days a week to aid in the the management of COPD; Provided education about and advised patient to utilize infection prevention strategies to reduce risk of respiratory infection; Discussed the importance of adequate rest and management of fatigue with COPD;   Diabetes:  (Status: Goal on Track (progressing): YES.) Long Term Goal         Lab Results  Component Value Date    HGBA1C 7.3 (H) 02/21/2021  Assessed patient's understanding of A1c goal: <7% Provided education to patient about basic DM disease process; Reviewed medications with patient and discussed  importance of medication adherence. 03-21-2021: The patients wife states that the patient is compliant with his medications. Denies any concerns with medications.  04-17-2021: The patients wife called the office and ask the staff to have RNCM call her. Call back to the patients wife. She needs assistance with paperwork that she received from [facility] care. She could not remember if it was "[facility] [name]" she needed to talk to. The patient states that she does not know what to do with the letter. Education provided and will have the pharm D reach out to the patient. Secure message sent to pharm D for assistance with the needs of the patient. The patients wife is appreciative of the help.       Reviewed prescribed diet with patient heart healthy/ADA. 03-21-2021: Review of dietary restrictions and monitoring for foods high in sugars, sodium, and fats.  Counseled on importance of regular laboratory monitoring as prescribed. 03-21-2021: The patient has regular lab work for evaluation of A1C and glucose;        Discussed plans with patient for ongoing care management follow up and provided patient with direct contact information for care management team;      Provided patient with written educational materials related to hypo and hyperglycemia and importance of correct treatment. 03-21-2021: Denies any lows at this time. ;       Reviewed scheduled/upcoming provider appointments including: Saw pcp on 04-13-2021 Knows to call for changes ;  Advised patient, providing education and rationale, to check cbg as directed  and record. 03-21-2021: The patients wife states his blood sugars this am were 110. Range for blood sugar readings have been 110 to 130. Review of goal of fasting blood sugars of <130 and post prandial of <180.        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 He has PT/OT and a nurse coming in the home to evaluate and monitor. The patient is walking with his walker and the patients wife denies any falls or safety concerns. The patient has a follow up with the provider on 04-10-2021. Education on sx and sx of infection and to call the office for changes at surgical site, questions or concerns.  04-17-2021: The patient was seen in the office on 04-13-2021 for evaluation of diarrhea he was having. Follow up today while discussing other needs and the patients wife states his diarrhea is better and denies any acute findings. Will continue to monitor.     Hyperlipidemia:  (Status: Goal on Track (progressing): YES.)      Lab Results  Component Value Date    CHOL 123 06/29/2020    HDL 29 (L) 06/29/2020    LDLCALC 65 06/29/2020    TRIG 170 (H) 06/29/2020    CHOLHDL 4.2 06/29/2020      Medication review performed; medication list updated in electronic medical record.  Provider established cholesterol goals reviewed. 03-21-2021: Review of labs and the patient is compliant with regular lab work needs; Counseled on importance of regular laboratory monitoring as prescribed; Provided HLD educational materials; Reviewed role and benefits of statin for ASCVD risk reduction; Discussed strategies to manage statin-induced myalgias; Reviewed importance of limiting foods high in cholesterol. 03-21-2021: Review of heart healthy/ADA diet and foods to limit Reviewed exercise goals and target of 150 minutes per week;   Hypertension: (Status: Goal on Track (progressing): YES.) Last practice recorded BP readings:     BP Readings from Last 3 Encounters:  02/24/21 124/66  02/14/21 110/62  02/01/21 110/64  Most recent eGFR/CrCl:       Lab Results  Component Value  Date    EGFR 44 (L) 06/29/2020    No components found for: CRCL   Evaluation of current treatment plan related to hypertension self management and patient's adherence to plan as established by provider. 01-24-2021: The patient was eating breakfast. Reviewed with wife several upcoming appointments and follow ups scheduled.   03-21-2021: The patient is having more normal blood pressure readings. The patient denies any issues with HTN or heart health. Has a lot of chronic conditions impacting his care. Will continue to monitor for changes.  Provided education to patient re: stroke prevention, s/s of heart attack and stroke; Reviewed prescribed diet heart healthy/ADA Reviewed medications with patient and discussed importance of compliance;  Discussed plans with patient for ongoing care management follow up and provided patient with direct contact information for care management team; Advised patient, providing education and rationale, to monitor blood pressure daily and record, calling PCP for findings outside established parameters;  Advised patient to discuss blood pressure trends  with  provider; Provided education on prescribed diet heart healthy/ADA diet ;  Discussed complications of poorly controlled blood pressure such as heart disease, stroke, circulatory complications, vision complications, kidney impairment, sexual dysfunction;    Pain:  (Status: Goal on Track (progressing): YES.) Long Term Goal  Pain assessment performed. 03-24-2020: the patient rates his pain in his bilateral shoulders at an 8 today on a scale of 0-10.  The patient states that he cannot raise his arms but so high. The patient is not taking any medications for pain relief. Has not tried heat or cold applications. The patient states the injections he gets at the provider office usually help but they have not this time. The patient states that he is open to recommendations. 03-21-2021: The patient denies any pain today. The patient has  upcoming imaging to evaluate shoulders.  Medications reviewed. 01-22-2021: The patient is currently not taking any medications for pain relief. Does have gabapentin that he takes on a consistent basis for neuropathy pain. Dosage in question concerning this as the neurologist has increased dose to 800 mg and renal dose recommends dose not to exceed 600 mg BID. Education with wife concerning medication changes. 03-21-2021: Is compliant with medications regimen.  Reviewed provider established plan for pain management. 01-22-2021: The patient and the patients wife ask if physical therapy would help with shoulder pain and discomfort. Will collaborate with the pcp on recommendations and let the patient and the patients wife know. 01-24-2021: Call made to [facility] on 01-23-2021 to ask about referral from pcp office. The staff states the referral was received and the patient has an appointment With Dr. Eliberto Ivory at 10 am on 02-14-2021.  Attempted to reach the patient or patients wife but was unsuccessful. Returned call today and explained to the wife the orthopedic appointment would address the patients shoulder pain and evaluate and recommend further treatment. Used teach back method for understanding of upcoming appointment with the orthopedic provider. Will continue to monitor. 03-21-2021: The patient is following the recommendations of the provider. The patient is seen on a consistent basis for evaluation and treatment options. Discussed importance of adherence to all scheduled medical appointments; Counseled on the importance of reporting any/all new or changed pain symptoms or management strategies to pain management provider; Advised patient to report to care team affect of pain on daily activities; Discussed use of relaxation techniques and/or diversional activities to assist with pain reduction (distraction, imagery, relaxation, massage, acupressure, TENS, heat, and cold application; Reviewed with patient  prescribed pharmacological and nonpharmacological pain relief strategies; Advised patient to discuss unresolved pain, changes in level and intensity of pain with provider;     GERD/Acid reflux  (Status: New goal.) Long Term Goal  Evaluation of current treatment plan related to GERD,  self-management and patient's adherence to plan as established by provider. 03-21-2021: During outreach today the patients wife states the patient for the last 3 days has had a "sour burp" and not feeling the best with episodes of diarrhea x 3 to 4 times during the night. Denies fever or chills. Is tolerating food and beverages without difficulty. Secure message sent to pcp and CMA for recommendations. Call made back to the wife with instructions from Dr. Parks Ranger. 04-17-2021: The patient denies any issues with GERD and Diarrhea he was seen in the office for on 04-13-2021 has resolved.  Discussed plans with patient for ongoing care management follow up and provided patient with direct contact information for care management team Advised patient to call the office  if symptoms of GERD/ACID reflux do not improve with recommended changes; Provided education to patient re: for the patient at the direction of pcp to increase his omeprazole dose from 20 mg daily to 40 mg daily. Used the teach back method to make sure the patients wife understood the instructions given. The patients wife wrote down instructions and repeated instructions back to [facility]; Reviewed medications with patient and discussed 03-21-2021: Advised the patients wife to only use imodium for severe diarrhea. Also advised per instructions from Dr. Parks Ranger that is was okay to take gas-x and pepto bismol as needed. Also to increase Omeprazole dose from 33m daily to 40 mg daily; Collaborated with PCP, CMA, and pharm D regarding new complaint of GI symptoms. Recommendations received and the wife called back with instructions. ; Provided patient with instructions from  pcp for new onset of GI symptoms x 3 days educational materials related to new onset of GI symptoms; Pharmacy referral for collaboration, ongoing support, and education for medications reconciliation and management ; Discussed plans with patient for ongoing care management follow up and provided patient with direct contact information for care management team; Advised patient to discuss unresolved GI condition with recommended changes, questions, concerns and sx and sx of worsening condition with provider;    Patient Goals/Self-Care Activities: Patient will self administer medications as prescribed as evidenced by self report/primary caregiver report  Patient will attend all scheduled provider appointments as evidenced by clinician review of documented attendance to scheduled appointments and patient/caregiver report Patient will call pharmacy for medication refills as evidenced by patient report and review of pharmacy fill history as appropriate Patient will attend church or other social activities as evidenced by patient report Patient will continue to perform ADL's independently as evidenced by patient/caregiver report Patient will continue to perform IADL's independently as evidenced by patient/caregiver report Patient will call provider office for new concerns or questions as evidenced by review of documented incoming telephone call notes and patient report Patient will work with BSW to address care coordination needs and will continue to work with the clinical team to address health care and disease management related needs as evidenced by documented adherence to scheduled care management/care coordination appointments - call office if I gain more than 2 pounds in one day or 5 pounds in one week - keep legs up while sitting - track weight in diary - use salt in moderation - watch for swelling in feet, ankles and legs every day - weigh myself daily - develop a rescue plan - follow rescue  plan if symptoms flare-up - know when to call the doctor:for changes in HF sx and sx - track symptoms and what helps feel better or worse - dress right for the weather, hot or cold - schedule appointment with eye doctor - check blood sugar at prescribed times: before meals and at bedtime, when you have symptoms of low or high blood sugar, and before and after exercise - check feet daily for cuts, sores or redness - enter blood sugar readings and medication or insulin into daily log - take the blood sugar log to all doctor visits - trim toenails straight across - drink 6 to 8 glasses of water each day - eat fish at least once per week - fill half of plate with vegetables - limit fast food meals to no more than 1 per week - manage portion size - prepare main meal at home 3 to 5 days each week - read food labels for  fat, fiber, carbohydrates and portion size - reduce red meat to 2 to 3 times a week - keep feet up while sitting - wash and dry feet carefully every day - wear comfortable, cotton socks - wear comfortable, well-fitting shoes Keep appointments with the podiatrist, next appointment on 01-24-2021 - avoid second hand smoke - eliminate smoking in my home - identify and remove indoor air pollutants - limit outdoor activity during cold weather - listen for public air quality announcements every day - do breathing exercises every day - develop a rescue plan - eliminate symptom triggers at home - follow rescue plan if symptoms flare-up - use an extra pillow to sleep - develop a new routine to improve sleep - don't eat or exercise right before bedtime - eat healthy/prescribed diet: heart healthy/ADA diet  - get at least 7 to 8 hours of sleep at night - use devices that will help like a cane, sock-puller or reacher - practice relaxation or meditation daily - do exercises in a comfortable position that makes breathing as easy as possible - check blood pressure 3 times per week -  choose a place to take my blood pressure (home, clinic or office, retail store) - write blood pressure results in a log or diary - learn about high blood pressure - keep a blood pressure log - take blood pressure log to all doctor appointments - call doctor for signs and symptoms of high blood pressure - develop an action plan for high blood pressure - keep all doctor appointments - take medications for blood pressure exactly as prescribed - report new symptoms to your doctor - eat more whole grains, fruits and vegetables, lean meats and healthy fats - call for medicine refill 2 or 3 days before it runs out - take all medications exactly as prescribed - call doctor with any symptoms you believe are related to your medicine - call doctor when you experience any new symptoms - go to all doctor appointments as scheduled - adhere to prescribed diet: heart healthy/ADA diet       Our next appointment is by telephone on 04-19-2021 at 0900 am  Please call the care guide team at [phone] if you need to cancel or reschedule your appointment.   If you are experiencing a Mental Health or [place] or need someone to talk to, please call the Suicide and Crisis Lifeline: 988 call the [place] National Suicide Prevention Lifeline: (412)384-9394 or TTY: 272-398-2916 TTY 737 805 3679) to talk to a trained counselor call 1-800-273-TALK (toll free, 24 hour hotline)   The patient verbalized understanding of instructions, educational materials, and care plan provided today and declined offer to receive copy of patient instructions, educational materials, and care plan.   Noreene Larsson RN, MSN, [place] Mobile: [phone]

## 2021-04-18 ENCOUNTER — Ambulatory Visit (INDEPENDENT_AMBULATORY_CARE_PROVIDER_SITE_OTHER): Payer: Medicare HMO | Admitting: Pharmacist

## 2021-04-18 DIAGNOSIS — E11621 Type 2 diabetes mellitus with foot ulcer: Secondary | ICD-10-CM

## 2021-04-18 DIAGNOSIS — I25118 Atherosclerotic heart disease of native coronary artery with other forms of angina pectoris: Secondary | ICD-10-CM

## 2021-04-18 DIAGNOSIS — K219 Gastro-esophageal reflux disease without esophagitis: Secondary | ICD-10-CM

## 2021-04-19 ENCOUNTER — Ambulatory Visit: Payer: Self-pay

## 2021-04-19 ENCOUNTER — Telehealth: Payer: Self-pay | Admitting: Nurse Practitioner

## 2021-04-19 ENCOUNTER — Telehealth: Payer: Medicare HMO

## 2021-04-19 DIAGNOSIS — R197 Diarrhea, unspecified: Secondary | ICD-10-CM

## 2021-04-19 DIAGNOSIS — I5032 Chronic diastolic (congestive) heart failure: Secondary | ICD-10-CM

## 2021-04-19 DIAGNOSIS — I10 Essential (primary) hypertension: Secondary | ICD-10-CM

## 2021-04-19 DIAGNOSIS — E11621 Type 2 diabetes mellitus with foot ulcer: Secondary | ICD-10-CM

## 2021-04-19 DIAGNOSIS — E782 Mixed hyperlipidemia: Secondary | ICD-10-CM

## 2021-04-19 DIAGNOSIS — L97509 Non-pressure chronic ulcer of other part of unspecified foot with unspecified severity: Secondary | ICD-10-CM

## 2021-04-19 DIAGNOSIS — M25512 Pain in left shoulder: Secondary | ICD-10-CM

## 2021-04-19 DIAGNOSIS — K219 Gastro-esophageal reflux disease without esophagitis: Secondary | ICD-10-CM

## 2021-04-19 DIAGNOSIS — G8929 Other chronic pain: Secondary | ICD-10-CM

## 2021-04-19 DIAGNOSIS — J432 Centrilobular emphysema: Secondary | ICD-10-CM

## 2021-04-19 DIAGNOSIS — N1831 Chronic kidney disease, stage 3a: Secondary | ICD-10-CM

## 2021-04-19 DIAGNOSIS — E1121 Type 2 diabetes mellitus with diabetic nephropathy: Secondary | ICD-10-CM

## 2021-04-19 DIAGNOSIS — I25118 Atherosclerotic heart disease of native coronary artery with other forms of angina pectoris: Secondary | ICD-10-CM

## 2021-04-19 NOTE — Patient Instructions (Signed)
Visit Information  Thank you for taking time to visit with me today. Please don't hesitate to contact me if I can be of assistance to you before our next scheduled telephone appointment.  Following are the goals we discussed today:  RNCM Clinical Goal(s):  Patient will verbalize understanding of plan for management of CHF, CAD, HTN, HLD, COPD, DMII, CKD Stage 3, and Osteoarthritis as evidenced by understanding the plan of care, calling the provider for changes, and working with the CCM to optimize health and well being for effective management of chronic conditions take all medications exactly as prescribed and will call provider for medication related questions as evidenced by taking medications as prescribed, contacting providers for questions about medications, and working with the pharm D to manage polypharmacy and medications concerns     attend all scheduled medical appointments: Saw pcp on 04-13-2021. The patients wife calls the office for changes and recommendations as evidenced by by keeping appointments with pcp and other specialist and calling for new appointments needed or questions        demonstrate improved and ongoing adherence to prescribed treatment plan for CHF, CAD, HTN, HLD, COPD, DMII, CKD Stage 3, and Osteoarthritis as evidenced by working with the CCM team to optimize health and well being  demonstrate a decrease in CHF, CAD, HTN, HLD, COPD, DMII, CKD Stage 3, and Osteoarthritis exacerbations  as evidenced by calling the provider for changes in conditions, stable chronic conditions and working with the CCM team to effectively manage health and well being  demonstrate ongoing self health care management ability for effective management of chronic conditions  as evidenced by  working with the CCM team  through collaboration with Consulting civil engineer, provider, and care team.    Interventions: 1:1 collaboration with primary care provider regarding development and update of comprehensive  plan of care as evidenced by provider attestation and co-signature Inter-disciplinary care team collaboration (see longitudinal plan of care) Evaluation of current treatment plan related to  self management and patient's adherence to plan as established by provider     SDOH Barriers (Status: Goal on Track (progressing): YES.) Long Term Goal  Patient interviewed and SDOH assessment performed        Patient interviewed and appropriate assessments performed Provided patient with information about resources in [facility] and care guides available for assistance with changes in SDOH, questions or concerns Discussed plans with patient for ongoing care management follow up and provided patient with direct contact information for care management team Advised patient to call the office for changes in [place], new concerns or questions  Provided education to patient/caregiver regarding level of care options. Care guide referral for expressed need of someone to install rails or a sliding door with rails to the bathroom. Instructed the patients wife that the care guide team would reach out to her concerning resources in [place]. (04-19-2021)       CAD  (Status: Goal on Track (progressing): YES.) Long Term Goal  Assessed understanding of CAD diagnosis Medications reviewed including medications utilized in CAD treatment plan Provided education on importance of blood pressure control in management of CAD; Provided education on Importance of limiting foods high in cholesterol. 04-19-2021: Review of dietary intake and patient is compliant with heart healthy/ADA diet. Education on foods to avoid since the patient is having diarrhea off and on. The patients wife is very attentive to the needs of the patient and watches what he eats and does.  Counseled on importance of  regular laboratory monitoring as prescribed; Reviewed Importance of taking all medications as prescribed. 04-19-2021: Review of medications  and patient is compliant with medications. Denies any medication needs or changes Reviewed Importance of attending all scheduled provider appointments. 04-19-2021: [place] appointments, calls for changes and needs Advised to report any changes in symptoms or exercise tolerance Screening for signs and symptoms of depression related to chronic disease state;  Assessed social determinant of health barriers;    Heart Failure Interventions:  (Status: Goal on Track (progressing): YES.)  Long Term Goal  Basic overview and discussion of pathophysiology of Heart Failure reviewed; Provided education on low sodium diet. 03-21-2021: The patient is compliant with heart healthy/ADA diet. Is not feeling the best today because of some diarrhea but wife states he is eating and drinking well. 04-19-2021: Review of heart healthy/ADA diet with the patients wife. The patient is having a good day today. Denies any fluid on board.  Reviewed Heart Failure Action Plan in depth and provided written copy; Assessed need for readable accurate scales in home. 04-19-2021: Participates in the HF program and weighs daily on a provided mat that send readings into the HF clinic for review and recommendations is any. Per the patients wife he is doing well and has not had an exacerbations in his HF. Reports weight daily.  Provided education about placing scale on hard, flat surface; Advised patient to weigh each morning after emptying bladder; Discussed importance of daily weight and advised patient to weigh and record daily; Reviewed role of diuretics in prevention of fluid overload and management of heart failure; Discussed the importance of keeping all appointments with provider; Provided patient with education about the role of exercise in the management of heart failure; Advised patient to discuss torsemide dose  with provider.  01-22-2021: The patients wife states that she has not heard back from the heart failure clinic on the Torsemide  dose. Advised the patients wife to call the HF clinic back and request clarification on Torsemide dose. The patients wife verbalized understanding. She states he is not having any issues with swelling but he is having a problem with "holding his water" and its been going on for a while. 01-24-2021: The pharm D is also assisting with getting in touch with the cardiologist/HF clinic to get clarification of medication dosage. 04-19-2021: The patient is taking medications as directed. Denies any issues with medication compliance.    Chronic Kidney Disease (Status: Goal on Track (progressing): YES.)  Long Term Goal  Last practice recorded BP readings:     BP Readings from Last 3 Encounters:  04/13/21 135/72  02/24/21 124/66  02/14/21 110/62  Most recent eGFR/CrCl:       Lab Results  Component Value Date    EGFR 44 (L) 06/29/2020    No components found for: CRCL   Assessed the patient   and patients wife for   understanding of chronic kidney disease    Evaluation of current treatment plan related to chronic kidney disease self management and patient's adherence to plan as established by provider . 01-22-2021: The patients wife states the patient is having issues with "holding his water". States the urologist gave him a pill but it does not help. The patient is taking myrbetriq but the patients wife says he cannot tell where it helps him. Advised the patients wife to follow up with urologist, will also collaborate with the pcp for any other additional recommendations. Advised the patients wife with the multiple medications he takes and  his chronic conditions that this is something that may be a little harder to manage.  04-19-2021: The patient is doing well with management of his CKD. The patients wife states there are no new concerns at this time with kidney function.   Provided education to patient re: stroke prevention, s/s of heart attack and stroke    Reviewed prescribed diet heart healthy/ADA diet.  04-19-2021: Review of heart healthy/ADA diet  Reviewed medications with patient and discussed importance of compliance.  04-19-2021: The patients wife states compliance with medications. Talked with pharm D on 04-18-2021. The patient works with the CCM team on a regular basis for assistance with effective management of chronic conditions.  Advised patient, providing education and rationale, to monitor blood pressure daily and record, calling PCP for findings outside established parameters    Discussed complications of poorly controlled blood pressure such as heart disease, stroke, circulatory complications, vision complications, kidney impairment, sexual dysfunction    Advised patient to discuss urinary incontinence and frequency  with provider    Discussed plans with patient for ongoing care management follow up and provided patient with direct contact information for care management team    Discussed the impact of chronic kidney disease on daily life and mental health and acknowledged and normalized feelings of disempowerment, fear, and frustration    Engage patient in early, proactive and ongoing discussion about goals of care and what matters most to them        COPD: (Status: Goal on Track (progressing): YES.) Long Term Goal  Reviewed medications with patient, including use of prescribed maintenance and rescue inhalers, and provided instruction on medication management and the importance of adherence. 04-19-2021: Is compliant with medications Provided patient with basic written and verbal COPD education on self care/management/and exacerbation prevention; Advised patient to track and manage COPD triggers. 04-19-2021: The patient and patients wife are aware of triggers that cause COPD exacerbation. The patient has some GI symptoms going on but is not having any respiratory sx and sx of infection. Education and support given;  Provided written and verbal instructions on pursed lip breathing and utilized  returned demonstration as teach back; Provided instruction about proper use of medications used for management of COPD including inhalers; Advised patient to self assesses COPD action plan zone and make appointment with provider if in the yellow zone for 48 hours without improvement; Advised patient to engage in light exercise as tolerated 3-5 days a week to aid in the the management of COPD; Provided education about and advised patient to utilize infection prevention strategies to reduce risk of respiratory infection. 04-19-2021: Review of staying safe and not being around others who are sick or exhibiting respiratory sx and sx. The patients wife states they only go out when they need to go. He cancelled his dentist appointment yesterday due to the patient needing to go off of his coumadin and the wife states that she is going to reschedule this at a later date. ; Discussed the importance of adequate rest and management of fatigue with COPD. 04-19-2021: Review of pacing activity. ;   Diabetes:  (Status: Goal on Track (progressing): YES.) Long Term Goal         Lab Results  Component Value Date    HGBA1C 7.3 (H) 02/21/2021  Assessed patient's understanding of A1c goal: <7% Provided education to patient about basic DM disease process; Reviewed medications with patient and discussed importance of medication adherence. 03-21-2021: The patients wife states that the patient is compliant with  his medications. Denies any concerns with medications.  04-17-2021: The patients wife called the office and ask the staff to have RNCM call her. Call back to the patients wife. She needs assistance with paperwork that she received from [facility]. She could not remember if it was "[facility] [name]" she needed to talk to. The patient states that she does not know what to do with the letter. Education provided and will have the pharm D reach out to the patient. Secure message sent to pharm D for assistance with the needs of  the patient. The patients wife is appreciative of the help.   04-19-2021: The patients wife spoke to the pharm D yesterday and was provided the information she needed. The patient is doing well and denies any new concerns at this time.     Reviewed prescribed diet with patient heart healthy/ADA. 04-19-2021: Review of dietary restrictions and monitoring for foods high in sugars, sodium, and fats.  Counseled on importance of regular laboratory monitoring as prescribed. 04-19-2021: The patient has regular lab work for evaluation of A1C and glucose;        Discussed plans with patient for ongoing care management follow up and provided patient with direct contact information for care management team;      Provided patient with written educational materials related to hypo and hyperglycemia and importance of correct treatment. 04-19-2021: Denies any lows at this time. ;       Reviewed scheduled/upcoming provider appointments including: Saw pcp on 04-13-2021 Knows to call for changes ;         Advised patient, providing education and rationale, to check cbg as directed  and record. 03-21-2021: The patients wife states his blood sugars this am were 110. Range for blood sugar readings have been 110 to 130. Review of goal of fasting blood sugars of <130 and post prandial of <180.        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 He has PT/OT and a nurse coming in the home to evaluate and monitor. The patient is walking with his walker and the patients wife denies any falls or safety concerns. The patient has a follow up with the provider on 04-10-2021. Education on sx and  sx of infection and to call the office for changes at surgical site, questions or concerns. 04-19-2021: The patients wife states the area of surgical intervention is healing well and there are no new concerns related to his right foot. Will continue to monitor.  04-17-2021: The patient was seen in the office on 04-13-2021 for evaluation of diarrhea he was having. Follow up today while discussing other needs and the patients wife states his diarrhea is better and denies any acute findings. Will continue to monitor.     Hyperlipidemia:  (Status: Goal on Track (progressing): YES.)      Lab Results  Component Value Date    CHOL 123 06/29/2020    HDL 29 (L) 06/29/2020    LDLCALC 65 06/29/2020    TRIG 170 (H) 06/29/2020    CHOLHDL 4.2 06/29/2020      Medication review performed; medication list  updated in electronic medical record.  Provider established cholesterol goals reviewed. 04-19-2021: Review of labs and the patient is compliant with regular lab work needs; Counseled on importance of regular laboratory monitoring as prescribed; Provided HLD educational materials; Reviewed role and benefits of statin for ASCVD risk reduction; Discussed strategies to manage statin-induced myalgias; Reviewed importance of limiting foods high in cholesterol. 04-19-2021: Review of heart healthy/ADA diet and foods to limit Reviewed exercise goals and target of 150 minutes per week;   Hypertension: (Status: Goal on Track (progressing): YES.) Last practice recorded BP readings:     BP Readings from Last 3 Encounters:  02/24/21 124/66  02/14/21 110/62  02/01/21 110/64  Most recent eGFR/CrCl:       Lab Results  Component Value Date    EGFR 44 (L) 06/29/2020    No components found for: CRCL   Evaluation of current treatment plan related to hypertension self management and patient's adherence to plan as established by provider. 01-24-2021: The patient was eating breakfast. Reviewed with wife several upcoming  appointments and follow ups scheduled.   04-19-2021: The patient is having more normal blood pressure readings. The patient denies any issues with HTN or heart health. Has a lot of chronic conditions impacting his care. Will continue to monitor for changes.  Provided education to patient re: stroke prevention, s/s of heart attack and stroke; Reviewed prescribed diet heart healthy/ADA Reviewed medications with patient and discussed importance of compliance;  Discussed plans with patient for ongoing care management follow up and provided patient with direct contact information for care management team; Advised patient, providing education and rationale, to monitor blood pressure daily and record, calling PCP for findings outside established parameters;  Advised patient to discuss blood pressure trends  with provider; Provided education on prescribed diet heart healthy/ADA diet ;  Discussed complications of poorly controlled blood pressure such as heart disease, stroke, circulatory complications, vision complications, kidney impairment, sexual dysfunction;    Pain:  (Status: Goal on Track (progressing): YES.) Long Term Goal  Pain assessment performed. 03-24-2020: the patient rates his pain in his bilateral shoulders at an 8 today on a scale of 0-10.  The patient states that he cannot raise his arms but so high. The patient is not taking any medications for pain relief. Has not tried heat or cold applications. The patient states the injections he gets at the provider office usually help but they have not this time. The patient states that he is open to recommendations. 03-21-2021: The patient denies any pain today. The patient has upcoming imaging to evaluate shoulders. 04-19-2021: Denies pain today. Is having a good day.  Medications reviewed. 01-22-2021: The patient is currently not taking any medications for pain relief. Does have gabapentin that he takes on a consistent basis for neuropathy pain. Dosage in  question concerning this as the neurologist has increased dose to 800 mg and renal dose recommends dose not to exceed 600 mg BID. Education with wife concerning medication changes. 04-19-2021: Is compliant with medications regimen.  Reviewed provider established plan for pain management. 01-22-2021: The patient and the patients wife ask if physical therapy would help with shoulder pain and discomfort. Will collaborate with the pcp on recommendations and let the patient and the patients wife know. 01-24-2021: Call made to [facility] on 01-23-2021 to ask about referral from pcp office. The staff states the referral was received and the patient has an appointment With Dr. Eliberto Ivory at 10 am on 02-14-2021.  Attempted to reach the  patient or patients wife but was unsuccessful. Returned call today and explained to the wife the orthopedic appointment would address the patients shoulder pain and evaluate and recommend further treatment. Used teach back method for understanding of upcoming appointment with the orthopedic provider. Will continue to monitor. 04-19-2021: The patient is following the recommendations of the provider. The patient is seen on a consistent basis for evaluation and treatment options. Discussed importance of adherence to all scheduled medical appointments; Counseled on the importance of reporting any/all new or changed pain symptoms or management strategies to pain management provider; Advised patient to report to care team affect of pain on daily activities; Discussed use of relaxation techniques and/or diversional activities to assist with pain reduction (distraction, imagery, relaxation, massage, acupressure, TENS, heat, and cold application; Reviewed with patient prescribed pharmacological and nonpharmacological pain relief strategies; Advised patient to discuss unresolved pain, changes in level and intensity of pain with provider;     GERD/Acid reflux  (Status: Goal on Track (progressing):  YES.) Long Term Goal  Evaluation of current treatment plan related to GERD,  self-management and patient's adherence to plan as established by provider. 03-21-2021: During outreach today the patients wife states the patient for the last 3 days has had a "sour burp" and not feeling the best with episodes of diarrhea x 3 to 4 times during the night. Denies fever or chills. Is tolerating food and beverages without difficulty. Secure message sent to pcp and CMA for recommendations. Call made back to the wife with instructions from Dr. Parks Ranger. 04-17-2021: The patient denies any issues with GERD and Diarrhea he was seen in the office for on 04-13-2021 has resolved. 04-19-2021: The patients wife states that he is doing good but the diarrhea is coming and going. The patients wife states that she has not gotten the peppermint OTC as recommended by pcp. She will pick some up. She states that she doesn't know why it happens sometimes and not others. Review of monitoring patterns of what the patient is eating and drinking and see if this is a part of the reason he is having diarrhea. The patients wife states since he is not drinking green tea he is doing better with the diarrhea. Education on foods that can cause more IBS and GERD. The patients wife states she will monitor more closely his dietary intake. States the patient is having a good day today. Will continue to monitor.  Discussed plans with patient for ongoing care management follow up and provided patient with direct contact information for care management team Advised patient to call the office if symptoms of GERD/ACID reflux do not improve with recommended changes; Provided education to patient re: for the patient at the direction of pcp to increase his omeprazole dose from 20 mg daily to 40 mg daily. Used the teach back method to make sure the patients wife understood the instructions given. The patients wife wrote down instructions and repeated instructions back  to [place]; Reviewed medications with patient and discussed 03-21-2021: Advised the patients wife to only use imodium for severe diarrhea. Also advised per instructions from Dr. Parks Ranger that is was okay to take gas-x and pepto bismol as needed. Also to increase Omeprazole dose from 56m daily to 40 mg daily. 04-19-2021: The patients wife endorses compliance with medications. States she has not picked up the peppermint OTC yet as recommended by the pcp but she will to have on hand for diarrhea. Also talked with the pharm D on 04-18-2021 for assistance with  medications questions; Collaborated with PCP, CMA, and pharm D regarding new complaint of GI symptoms. Recommendations received and the wife called back with instructions. 04-19-2021: The patient is compliant with recommendations by the pcp; Provided patient with instructions from pcp for new onset of GI symptoms x 3 days educational materials related to new onset of GI symptoms. 04-19-2021: Additional follow up and education today on GI symptoms and if flare up occurs; Pharmacy referral for collaboration, ongoing support, and education for medications reconciliation and management ; Discussed plans with patient for ongoing care management follow up and provided patient with direct contact information for care management team; Advised patient to discuss unresolved GI condition with recommended changes, questions, concerns and sx and sx of worsening condition with provider;    Patient Goals/Self-Care Activities: Patient will self administer medications as prescribed as evidenced by self report/primary caregiver report  Patient will attend all scheduled provider appointments as evidenced by clinician review of documented attendance to scheduled appointments and patient/caregiver report Patient will call pharmacy for medication refills as evidenced by patient report and review of pharmacy fill history as appropriate Patient will attend church or other social  activities as evidenced by patient report Patient will continue to perform ADL's independently as evidenced by patient/caregiver report Patient will continue to perform IADL's independently as evidenced by patient/caregiver report Patient will call provider office for new concerns or questions as evidenced by review of documented incoming telephone call notes and patient report Patient will work with BSW to address care coordination needs and will continue to work with the clinical team to address health care and disease management related needs as evidenced by documented adherence to scheduled care management/care coordination appointments - call office if I gain more than 2 pounds in one day or 5 pounds in one week - keep legs up while sitting - track weight in diary - use salt in moderation - watch for swelling in feet, ankles and legs every day - weigh myself daily - develop a rescue plan - follow rescue plan if symptoms flare-up - know when to call the doctor:for changes in HF sx and sx - track symptoms and what helps feel better or worse - dress right for the weather, hot or cold - schedule appointment with eye doctor - check blood sugar at prescribed times: before meals and at bedtime, when you have symptoms of low or high blood sugar, and before and after exercise - check feet daily for cuts, sores or redness - enter blood sugar readings and medication or insulin into daily log - take the blood sugar log to all doctor visits - trim toenails straight across - drink 6 to 8 glasses of water each day - eat fish at least once per week - fill half of plate with vegetables - limit fast food meals to no more than 1 per week - manage portion size - prepare main meal at home 3 to 5 days each week - read food labels for fat, fiber, carbohydrates and portion size - reduce red meat to 2 to 3 times a week - keep feet up while sitting - wash and dry feet carefully every day - wear  comfortable, cotton socks - wear comfortable, well-fitting shoes Keep appointments with the podiatrist, next appointment on 01-24-2021 - avoid second hand smoke - eliminate smoking in my home - identify and remove indoor air pollutants - limit outdoor activity during cold weather - listen for public air quality announcements every day - do breathing  exercises every day - develop a rescue plan - eliminate symptom triggers at home - follow rescue plan if symptoms flare-up - use an extra pillow to sleep - develop a new routine to improve sleep - don't eat or exercise right before bedtime - eat healthy/prescribed diet: heart healthy/ADA diet  - get at least 7 to 8 hours of sleep at night - use devices that will help like a cane, sock-puller or reacher - practice relaxation or meditation daily - do exercises in a comfortable position that makes breathing as easy as possible - check blood pressure 3 times per week - choose a place to take my blood pressure (home, clinic or office, retail store) - write blood pressure results in a log or diary - learn about high blood pressure - keep a blood pressure log - take blood pressure log to all doctor appointments - call doctor for signs and symptoms of high blood pressure - develop an action plan for high blood pressure - keep all doctor appointments - take medications for blood pressure exactly as prescribed - report new symptoms to your doctor - eat more whole grains, fruits and vegetables, lean meats and healthy fats - call for medicine refill 2 or 3 days before it runs out - take all medications exactly as prescribed - call doctor with any symptoms you believe are related to your medicine - call doctor when you experience any new symptoms - go to all doctor appointments as scheduled - adhere to prescribed diet: heart healthy/ADA diet       Our next appointment is by telephone on 06-21-2021 at 0900 am  Please call the care guide team at  [phone] if you need to cancel or reschedule your appointment.   If you are experiencing a Mental Health or [place] or need someone to talk to, please call the Suicide and Crisis Lifeline: 988 call the [place] National Suicide Prevention Lifeline: 226-458-5576 or TTY: 781-241-7962 TTY 380-674-7078) to talk to a trained counselor call 1-800-273-TALK (toll free, 24 hour hotline)   The patient verbalized understanding of instructions, educational materials, and care plan provided today and declined offer to receive copy of patient instructions, educational materials, and care plan.   Noreene Larsson RN, MSN, [facility] Mobile: [phone]

## 2021-04-19 NOTE — Telephone Encounter (Signed)
Pt c/o medication issue:  1. Name of Medication: asa   2. How are you currently taking this medication (dosage and times per day)? Please confirm   3. Are you having a reaction (difficulty breathing--STAT)? No   4. What is your medication issue? Noted November 2022 no asa in setting of warfarin but continue asa   Note is conflicting please call pcp office .

## 2021-04-19 NOTE — Chronic Care Management (AMB) (Signed)
Chronic Care Management   CCM RN Visit Note  04/19/2021 Name: Lucas Caldwell MRN: [id number] DOB: Feb 03, 1934  Subjective: Lucas Caldwell is a 88 y.o. year old male who is a primary care patient of [name], DO. The care management team was consulted for assistance with disease management and care coordination needs.    Engaged with patient by telephone for follow up visit in response to provider referral for case management and/or care coordination services.   Consent to Services:  The patient was given information about Chronic Care Management services, agreed to services, and gave verbal consent prior to initiation of services.  Please see initial visit note for detailed documentation.   Patient agreed to services and verbal consent obtained.   Assessment: Review of patient past medical history, allergies, medications, health status, including review of consultants reports, laboratory and other test data, was performed as part of comprehensive evaluation and provision of chronic care management services.   SDOH (Social Determinants of Health) assessments and interventions performed:    CCM Care Plan  Allergies  Allergen Reactions   Clopidogrel Anaphylaxis and Other (See Comments)    altered mental status;  (electrolytes "out of wack" and confusion per family; THEY DO NOT REMEMBER ANY OTHER REACTION)- MSB 10/10/15   Ciprofloxacin Itching   Beta Adrenergic Blockers     Nightmares   Mirtazapine Diarrhea    Bad dreams    Spironolactone Other (See Comments)    gynecomastia    Outpatient Encounter Medications as of 04/19/2021  Medication Sig   acetaminophen (TYLENOL) 500 MG tablet Take 1,000 mg by mouth every 6 (six) hours as needed for moderate pain or headache.    Alcohol Swabs (B-D SINGLE USE SWABS REGULAR) PADS Use to check blood sugar up to 2 times daily   allopurinol (ZYLOPRIM) 100 MG tablet Take 1 tablet (100 mg total) by mouth daily.   aspirin 81 MG  chewable tablet Chew 81 mg by mouth daily.   Blood Glucose Monitoring Suppl (TRUE METRIX METER) DEVI Use to check blood sugar up to 2 times daily   bosutinib (BOSULIF) 100 MG tablet Take 100 mg by mouth at bedtime. Take with food.   Cholecalciferol 25 MCG (1000 UT) capsule Take 1,000 Units by mouth daily.   famotidine (PEPCID) 40 MG tablet Take 1 tablet (40 mg total) by mouth 2 (two) times daily with a meal.   gabapentin (NEURONTIN) 300 MG capsule Take 300 mg by mouth 3 (three) times daily.   Insulin Glargine ([name] KWIKPEN) 100 UNIT/ML Inject 18 Units into the skin daily.   Insulin Pen Needle 31G X 5 MM MISC 31 g by Does not apply route as directed.   isosorbide mononitrate (IMDUR) 60 MG 24 hr tablet Take 0.5 tablets (30 mg total) by mouth daily.   JARDIANCE 10 MG TABS tablet Take 1 tablet (10 mg total) by mouth daily with breakfast.   levothyroxine (SYNTHROID) 25 MCG tablet TAKE ONE TABLET BY MOUTH EVERY MORNING BEFORE BREAKFAST (Patient taking differently: Take 25 mcg by mouth daily before breakfast.)   losartan (COZAAR) 25 MG tablet Take 12.5 mg by mouth daily.   Menthol-Zinc Oxide (GOLD BOND EX) Apply 1 application topically daily as needed (muscle pain).   Multiple Vitamin (MULTIVITAMIN WITH MINERALS) TABS tablet Take 1 tablet by mouth daily.   nitroGLYCERIN (NITROSTAT) 0.4 MG SL tablet Place 1 tablet (0.4 mg total) under the tongue every 5 (five) minutes x 3 doses as needed for chest pain.  Polyethyl Glycol-Propyl Glycol (SYSTANE OP) Place 1 drop into both eyes 3 (three) times daily as needed (dry/irritated eyes.).    polyethylene glycol (MIRALAX / GLYCOLAX) 17 g packet Take 17 g by mouth daily as needed for mild constipation.    ranolazine (RANEXA) 500 MG 12 hr tablet TAKE ONE TABLET BY MOUTH TWICE DAILY (Patient taking differently: Take 500 mg by mouth 2 (two) times daily.)   rosuvastatin (CRESTOR) 5 MG tablet TAKE ONE TABLET BY MOUTH ONCE DAILY (Patient taking differently: Take 5 mg  by mouth daily.)   tamsulosin (FLOMAX) 0.4 MG CAPS capsule Take 1 capsule (0.4 mg total) by mouth daily after supper.   torsemide (DEMADEX) 20 MG tablet Take 2 tablets (40 mg total) by mouth daily.   traMADol (ULTRAM) 50 MG tablet Take 1-2 tablets (50-100 mg total) by mouth at bedtime as needed.   TRULICITY 1.5 JA/2.5KN SOPN Inject 1.5 mg into the skin once a week.   warfarin (COUMADIN) 3 MG tablet TAKE 1 TO 1 & 1/2 TABLETS BY MOUTH AS DIRECTED BY THE COUMADIN CLINIC   No facility-administered encounter medications on file as of 04/19/2021.    Patient Active Problem List   Diagnosis Date Noted   Amputated great toe, right ([place]) 03/05/2021   Acute hematogenous osteomyelitis ([place])    Cellulitis of foot    Diabetic foot infection ([place])    Diabetic foot ulcer ([place]) 02/21/2021   Diabetic polyneuropathy associated with type 2 diabetes mellitus ([place]) 11/23/2020   Orthostatic hypotension 10/16/2020   Obstructive sleep apnea 06/29/2020   Urge incontinence of urine 12/22/2019   Trifascicular block 10/20/2019   Wound infection 10/08/2019   Bifascicular block 05/13/2019   Coronary stent patent 04/13/2019   PSVT (paroxysmal supraventricular tachycardia) ([place]) 03/19/2019   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 ([place]) 08/24/2018   CKD (chronic kidney disease), stage IV ([place]) 06/04/2018   Osteoarthritis of knees, bilateral 06/02/2018   Hyperlipidemia associated with type 2 diabetes mellitus ([place]) 05/06/2018   Pain of lower extremity 03/06/2018   Duodenitis 01/30/2018   Hematochezia    Melena    Rectal bleeding 01/11/2018   AVM (arteriovenous malformation) of colon without hemorrhage    Schatzki's ring    Esophageal dysphagia    Diabetic retinopathy ([place]) 07/08/2017   Ischemic cardiomyopathy 06/26/2017   Cardiomyopathy ([place]) 06/26/2017   Centrilobular emphysema ([place]) 06/13/2017   Fatigue 12/18/2016   Stable angina ([place]) 09/20/2016   Stage 3a chronic kidney disease ([place])  09/20/2016   Paroxysmal atrial fibrillation ([place]) 08/28/2016   Chronic anticoagulation 08/28/2016   Essential hypertension 08/20/2016   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 heart with stable angina pectoris ([place]) 08/19/2016   Accelerating angina ([place]) 08/15/2016   Exertional dyspnea 08/12/2016   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[place]) 12/29/2015   Osteomyelitis of great toe of right foot ([place]) 10/03/2015   Ulcerated, foot, right, with necrosis of bone ([place]) 09/26/2015   Gynecomastia, male 03/31/2014   Hypertrophy of breast 03/31/2014   Nuclear sclerosis of left eye 10/22/2013   Enthesopathy of ankle and tarsus 04/10/2013   Controlled type 2 diabetes mellitus with diabetic nephropathy, with long-term current use of insulin ([place]) 04/09/2013   Diabetic neuropathy associated with type 2 diabetes mellitus ([place]) 04/09/2013   Cataract of left eye 11/25/2011   Corneal opacity 09/30/2011   Pseudophakia of right eye 09/30/2011   GERD (gastroesophageal reflux disease) 09/25/2011   Gout 09/25/2011   Hyperlipidemia LDL goal <70 09/25/2011   Status  post cataract extraction 04/11/2011   Cataract extraction status of eye 04/11/2011   Iron deficiency anemia 04/01/2011   Benign localized hyperplasia of prostate without urinary obstruction and other lower urinary tract symptoms (LUTS) 12/10/2010   Enlarged prostate without lower urinary tract symptoms (luts) 12/10/2010   Colon cancer screening 10/11/2010   Hypothyroidism 10/11/2010   Hyperlipidemia 09/20/2003   Essential and other specified forms of tremor 01/05/2001   Essential tremor 01/05/2001    Conditions to be addressed/monitored:CHF, CAD, HTN, HLD, COPD, DMII, CKD Stage 3, and Chronic pain, and GERD/diarrhea  Care Plan : RNCM: General Plan of Care (Adult) for Chronic Disease Management and Care Cooerdination Needs  Updates made by Vanita Ingles, RN since 04/19/2021 12:00 AM     Problem: RNCM: Development of Plan of Care for Chronic Disease  Management (HF, CAD, HTN,HLD, DM, COPD, CKD3, Chronic Pain)   Priority: High     Long-Range Goal: RNCM: Effective Management  of Plan of Care for Chronic Disease Management (HF, CAD, HTN,HLD, DM, COPD, CKD3, Chronic Pain)   Start Date: 01/22/2021  Expected End Date: 01/22/2022  Priority: High  Note:   Current Barriers:  Knowledge Deficits related to plan of care for management of CHF, CAD, HTN, HLD, COPD, DMII, CKD Stage 3, and chronic pain  Care Coordination needs related to Level of care concerns and multiple chronic conditions  Chronic Disease Management support and education needs related to CHF, CAD, HTN, HLD, COPD, DMII, CKD Stage 3, and chronic pain   RNCM Clinical Goal(s):  Patient will verbalize understanding of plan for management of CHF, CAD, HTN, HLD, COPD, DMII, CKD Stage 3, and Osteoarthritis as evidenced by understanding the plan of care, calling the provider for changes, and working with the CCM to optimize health and well being for effective management of chronic conditions take all medications exactly as prescribed and will call provider for medication related questions as evidenced by taking medications as prescribed, contacting providers for questions about medications, and working with the pharm D to manage polypharmacy and medications concerns     attend all scheduled medical appointments: Saw pcp on 04-13-2021. The patients wife calls the office for changes and recommendations as evidenced by by keeping appointments with pcp and other specialist and calling for new appointments needed or questions        demonstrate improved and ongoing adherence to prescribed treatment plan for CHF, CAD, HTN, HLD, COPD, DMII, CKD Stage 3, and Osteoarthritis as evidenced by working with the CCM team to optimize health and well being  demonstrate a decrease in CHF, CAD, HTN, HLD, COPD, DMII, CKD Stage 3, and Osteoarthritis exacerbations  as evidenced by calling the provider for changes in  conditions, stable chronic conditions and working with the CCM team to effectively manage health and well being  demonstrate ongoing self health care management ability for effective management of chronic conditions  as evidenced by  working with the CCM team  through collaboration with Consulting civil engineer, provider, and care team.   Interventions: 1:1 collaboration with primary care provider regarding development and update of comprehensive plan of care as evidenced by provider attestation and co-signature Inter-disciplinary care team collaboration (see longitudinal plan of care) Evaluation of current treatment plan related to  self management and patient's adherence to plan as established by provider   SDOH Barriers (Status: Goal on Track (progressing): YES.) Long Term Goal  Patient interviewed and SDOH assessment performed        Patient interviewed and  appropriate assessments performed Provided patient with information about resources in [facility] and care guides available for assistance with changes in SDOH, questions or concerns Discussed plans with patient for ongoing care management follow up and provided patient with direct contact information for care management team Advised patient to call the office for changes in SDOH, new concerns or questions  Provided education to patient/caregiver regarding level of care options. Care guide referral for expressed need of someone to install rails or a sliding door with rails to the bathroom. Instructed the patients wife that the care guide team would reach out to her concerning resources in [place]. (04-19-2021)    CAD  (Status: Goal on Track (progressing): YES.) Long Term Goal  Assessed understanding of CAD diagnosis Medications reviewed including medications utilized in CAD treatment plan Provided education on importance of blood pressure control in management of CAD; Provided education on Importance of limiting foods high in  cholesterol. 04-19-2021: Review of dietary intake and patient is compliant with heart healthy/ADA diet. Education on foods to avoid since the patient is having diarrhea off and on. The patients wife is very attentive to the needs of the patient and watches what he eats and does.  Counseled on importance of regular laboratory monitoring as prescribed; Reviewed Importance of taking all medications as prescribed. 04-19-2021: Review of medications and patient is compliant with medications. Denies any medication needs or changes Reviewed Importance of attending all scheduled provider appointments. 04-19-2021: [facility] appointments, calls for changes and needs Advised to report any changes in symptoms or exercise tolerance Screening for signs and symptoms of depression related to chronic disease state;  Assessed social determinant of health barriers;   Heart Failure Interventions:  (Status: Goal on Track (progressing): YES.)  Long Term Goal  Basic overview and discussion of pathophysiology of Heart Failure reviewed; Provided education on low sodium diet. 03-21-2021: The patient is compliant with heart healthy/ADA diet. Is not feeling the best today because of some diarrhea but wife states he is eating and drinking well. 04-19-2021: Review of heart healthy/ADA diet with the patients wife. The patient is having a good day today. Denies any fluid on board.  Reviewed Heart Failure Action Plan in depth and provided written copy; Assessed need for readable accurate scales in home. 04-19-2021: Participates in the HF program and weighs daily on a provided mat that send readings into the HF clinic for review and recommendations is any. Per the patients wife he is doing well and has not had an exacerbations in his HF. Reports weight daily.  Provided education about placing scale on hard, flat surface; Advised patient to weigh each morning after emptying bladder; Discussed importance of daily weight and advised patient to weigh and  record daily; Reviewed role of diuretics in prevention of fluid overload and management of heart failure; Discussed the importance of keeping all appointments with provider; Provided patient with education about the role of exercise in the management of heart failure; Advised patient to discuss torsemide dose  with provider.  01-22-2021: The patients wife states that she has not heard back from the heart failure clinic on the Torsemide dose. Advised the patients wife to call the HF clinic back and request clarification on Torsemide dose. The patients wife verbalized understanding. She states he is not having any issues with swelling but he is having a problem with "holding his water" and its been going on for a while. 01-24-2021: The pharm D is also assisting with getting in touch with the  cardiologist/HF clinic to get clarification of medication dosage. 04-19-2021: The patient is taking medications as directed. Denies any issues with medication compliance.   Chronic Kidney Disease (Status: Goal on Track (progressing): YES.)  Long Term Goal  Last practice recorded BP readings:  BP Readings from Last 3 Encounters:  04/13/21 135/72  02/24/21 124/66  02/14/21 110/62  Most recent eGFR/CrCl:  Lab Results  Component Value Date   EGFR 44 (L) 06/29/2020    No components found for: CRCL  Assessed the patient   and patients wife for   understanding of chronic kidney disease    Evaluation of current treatment plan related to chronic kidney disease self management and patient's adherence to plan as established by provider . 01-22-2021: The patients wife states the patient is having issues with "holding his water". States the urologist gave him a pill but it does not help. The patient is taking myrbetriq but the patients wife says he cannot tell where it helps him. Advised the patients wife to follow up with urologist, will also collaborate with the pcp for any other additional recommendations. Advised the patients  wife with the multiple medications he takes and his chronic conditions that this is something that may be a little harder to manage.  04-19-2021: The patient is doing well with management of his CKD. The patients wife states there are no new concerns at this time with kidney function.   Provided education to patient re: stroke prevention, s/s of heart attack and stroke    Reviewed prescribed diet heart healthy/ADA diet. 04-19-2021: Review of heart healthy/ADA diet  Reviewed medications with patient and discussed importance of compliance.  04-19-2021: The patients wife states compliance with medications. Talked with pharm D on 04-18-2021. The patient works with the CCM team on a regular basis for assistance with effective management of chronic conditions.  Advised patient, providing education and rationale, to monitor blood pressure daily and record, calling PCP for findings outside established parameters    Discussed complications of poorly controlled blood pressure such as heart disease, stroke, circulatory complications, vision complications, kidney impairment, sexual dysfunction    Advised patient to discuss urinary incontinence and frequency  with provider    Discussed plans with patient for ongoing care management follow up and provided patient with direct contact information for care management team    Discussed the impact of chronic kidney disease on daily life and mental health and acknowledged and normalized feelings of disempowerment, fear, and frustration    Engage patient in early, proactive and ongoing discussion about goals of care and what matters most to them      COPD: (Status: Goal on Track (progressing): YES.) Long Term Goal  Reviewed medications with patient, including use of prescribed maintenance and rescue inhalers, and provided instruction on medication management and the importance of adherence. 04-19-2021: Is compliant with medications Provided patient with basic written and verbal COPD  education on self care/management/and exacerbation prevention; Advised patient to track and manage COPD triggers. 04-19-2021: The patient and patients wife are aware of triggers that cause COPD exacerbation. The patient has some GI symptoms going on but is not having any respiratory sx and sx of infection. Education and support given;  Provided written and verbal instructions on pursed lip breathing and utilized returned demonstration as teach back; Provided instruction about proper use of medications used for management of COPD including inhalers; Advised patient to self assesses COPD action plan zone and make appointment with provider if in the yellow zone  for 48 hours without improvement; Advised patient to engage in light exercise as tolerated 3-5 days a week to aid in the the management of COPD; Provided education about and advised patient to utilize infection prevention strategies to reduce risk of respiratory infection. 04-19-2021: Review of staying safe and not being around others who are sick or exhibiting respiratory sx and sx. The patients wife states they only go out when they need to go. He cancelled his dentist appointment yesterday due to the patient needing to go off of his coumadin and the wife states that she is going to reschedule this at a later date. ; Discussed the importance of adequate rest and management of fatigue with COPD. 04-19-2021: Review of pacing activity. ;  Diabetes:  (Status: Goal on Track (progressing): YES.) Long Term Goal   Lab Results  Component Value Date   HGBA1C 7.3 (H) 02/21/2021  Assessed patient's understanding of A1c goal: <7% Provided education to patient about basic DM disease process; Reviewed medications with patient and discussed importance of medication adherence. 03-21-2021: The patients wife states that the patient is compliant with his medications. Denies any concerns with medications.  04-17-2021: The patients wife called the office and ask the staff to  have RNCM call her. Call back to the patients wife. She needs assistance with paperwork that she received from [facility]. She could not remember if it was "[facility] [name]" she needed to talk to. The patient states that she does not know what to do with the letter. Education provided and will have the pharm D reach out to the patient. Secure message sent to pharm D for assistance with the needs of the patient. The patients wife is appreciative of the help.   04-19-2021: The patients wife spoke to the pharm D yesterday and was provided the information she needed. The patient is doing well and denies any new concerns at this time.     Reviewed prescribed diet with patient heart healthy/ADA. 04-19-2021: Review of dietary restrictions and monitoring for foods high in sugars, sodium, and fats.  Counseled on importance of regular laboratory monitoring as prescribed. 04-19-2021: The patient has regular lab work for evaluation of A1C and glucose;        Discussed plans with patient for ongoing care management follow up and provided patient with direct contact information for care management team;      Provided patient with written educational materials related to hypo and hyperglycemia and importance of correct treatment. 04-19-2021: Denies any lows at this time. ;       Reviewed scheduled/upcoming provider appointments including: Saw pcp on 04-13-2021 Knows to call for changes ;         Advised patient, providing education and rationale, to check cbg as directed  and record. 03-21-2021: The patients wife states his blood sugars this am were 110. Range for blood sugar readings have been 110 to 130. Review of goal of fasting blood sugars of <130 and post prandial of <180.        call provider for findings outside established parameters;       Review of patient status, including review of consultants reports, relevant laboratory and other test results, and medications completed;     Evaluation of right foot at sight of great  toe amputation. 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 He has PT/OT and a nurse coming in  the home to evaluate and monitor. The patient is walking with his walker and the patients wife denies any falls or safety concerns. The patient has a follow up with the provider on 04-10-2021. Education on sx and sx of infection and to call the office for changes at surgical site, questions or concerns. 04-19-2021: The patients wife states the area of surgical intervention is healing well and there are no new concerns related to his right foot. Will continue to monitor.  04-17-2021: The patient was seen in the office on 04-13-2021 for evaluation of diarrhea he was having. Follow up today while discussing other needs and the patients wife states his diarrhea is better and denies any acute findings. Will continue to monitor.    Hyperlipidemia:  (Status: Goal on Track (progressing): YES.) Lab Results  Component Value Date   CHOL 123 06/29/2020   HDL 29 (L) 06/29/2020   LDLCALC 65 06/29/2020   TRIG 170 (H) 06/29/2020   CHOLHDL 4.2 06/29/2020     Medication review performed; medication list updated in electronic medical record.  Provider established cholesterol goals reviewed. 04-19-2021: Review of labs and the patient is compliant with regular lab work needs; Counseled on importance of regular laboratory monitoring as prescribed; Provided HLD educational materials; Reviewed role and benefits of statin for ASCVD risk reduction; Discussed strategies to manage statin-induced myalgias; Reviewed importance of limiting foods high in cholesterol. 04-19-2021: Review of heart healthy/ADA diet and foods to limit Reviewed exercise goals and target of 150 minutes per week;  Hypertension: (Status: Goal on Track (progressing): YES.) Last practice recorded BP readings:  BP Readings from Last 3 Encounters:  02/24/21 124/66   02/14/21 110/62  02/01/21 110/64  Most recent eGFR/CrCl:  Lab Results  Component Value Date   EGFR 44 (L) 06/29/2020    No components found for: CRCL  Evaluation of current treatment plan related to hypertension self management and patient's adherence to plan as established by provider. 01-24-2021: The patient was eating breakfast. Reviewed with wife several upcoming appointments and follow ups scheduled.   04-19-2021: The patient is having more normal blood pressure readings. The patient denies any issues with HTN or heart health. Has a lot of chronic conditions impacting his care. Will continue to monitor for changes.  Provided education to patient re: stroke prevention, s/s of heart attack and stroke; Reviewed prescribed diet heart healthy/ADA Reviewed medications with patient and discussed importance of compliance;  Discussed plans with patient for ongoing care management follow up and provided patient with direct contact information for care management team; Advised patient, providing education and rationale, to monitor blood pressure daily and record, calling PCP for findings outside established parameters;  Advised patient to discuss blood pressure trends  with provider; Provided education on prescribed diet heart healthy/ADA diet ;  Discussed complications of poorly controlled blood pressure such as heart disease, stroke, circulatory complications, vision complications, kidney impairment, sexual dysfunction;   Pain:  (Status: Goal on Track (progressing): YES.) Long Term Goal  Pain assessment performed. 03-24-2020: the patient rates his pain in his bilateral shoulders at an 8 today on a scale of 0-10.  The patient states that he cannot raise his arms but so high. The patient is not taking any medications for pain relief. Has not tried heat or cold applications. The patient states the injections he gets at the provider office usually help but they have not this time. The patient states that he  is open to recommendations. 03-21-2021: The patient denies any pain  today. The patient has upcoming imaging to evaluate shoulders. 04-19-2021: Denies pain today. Is having a good day.  Medications reviewed. 01-22-2021: The patient is currently not taking any medications for pain relief. Does have gabapentin that he takes on a consistent basis for neuropathy pain. Dosage in question concerning this as the neurologist has increased dose to 800 mg and renal dose recommends dose not to exceed 600 mg BID. Education with wife concerning medication changes. 04-19-2021: Is compliant with medications regimen.  Reviewed provider established plan for pain management. 01-22-2021: The patient and the patients wife ask if physical therapy would help with shoulder pain and discomfort. Will collaborate with the pcp on recommendations and let the patient and the patients wife know. 01-24-2021: Call made to [facility] on 01-23-2021 to ask about referral from pcp office. The staff states the referral was received and the patient has an appointment With Dr. Eliberto Ivory at 10 am on 02-14-2021.  Attempted to reach the patient or patients wife but was unsuccessful. Returned call today and explained to the wife the orthopedic appointment would address the patients shoulder pain and evaluate and recommend further treatment. Used teach back method for understanding of upcoming appointment with the orthopedic provider. Will continue to monitor. 04-19-2021: The patient is following the recommendations of the provider. The patient is seen on a consistent basis for evaluation and treatment options. Discussed importance of adherence to all scheduled medical appointments; Counseled on the importance of reporting any/all new or changed pain symptoms or management strategies to pain management provider; Advised patient to report to care team affect of pain on daily activities; Discussed use of relaxation techniques and/or diversional activities to assist  with pain reduction (distraction, imagery, relaxation, massage, acupressure, TENS, heat, and cold application; Reviewed with patient prescribed pharmacological and nonpharmacological pain relief strategies; Advised patient to discuss unresolved pain, changes in level and intensity of pain with provider;   GERD/Acid reflux  (Status: Goal on Track (progressing): YES.) Long Term Goal  Evaluation of current treatment plan related to GERD,  self-management and patient's adherence to plan as established by provider. 03-21-2021: During outreach today the patients wife states the patient for the last 3 days has had a "sour burp" and not feeling the best with episodes of diarrhea x 3 to 4 times during the night. Denies fever or chills. Is tolerating food and beverages without difficulty. Secure message sent to pcp and CMA for recommendations. Call made back to the wife with instructions from Dr. Parks Ranger. 04-17-2021: The patient denies any issues with GERD and Diarrhea he was seen in the office for on 04-13-2021 has resolved. 04-19-2021: The patients wife states that he is doing good but the diarrhea is coming and going. The patients wife states that she has not gotten the peppermint OTC as recommended by pcp. She will pick some up. She states that she doesn't know why it happens sometimes and not others. Review of monitoring patterns of what the patient is eating and drinking and see if this is a part of the reason he is having diarrhea. The patients wife states since he is not drinking green tea he is doing better with the diarrhea. Education on foods that can cause more IBS and GERD. The patients wife states she will monitor more closely his dietary intake. States the patient is having a good day today. Will continue to monitor.  Discussed plans with patient for ongoing care management follow up and provided patient with direct contact information for care management  team Advised patient to call the office if  symptoms of GERD/ACID reflux do not improve with recommended changes; Provided education to patient re: for the patient at the direction of pcp to increase his omeprazole dose from 20 mg daily to 40 mg daily. Used the teach back method to make sure the patients wife understood the instructions given. The patients wife wrote down instructions and repeated instructions back to [facility]; Reviewed medications with patient and discussed 03-21-2021: Advised the patients wife to only use imodium for severe diarrhea. Also advised per instructions from Dr. Parks Ranger that is was okay to take gas-x and pepto bismol as needed. Also to increase Omeprazole dose from 58m daily to 40 mg daily. 04-19-2021: The patients wife endorses compliance with medications. States she has not picked up the peppermint OTC yet as recommended by the pcp but she will to have on hand for diarrhea. Also talked with the pharm D on 04-18-2021 for assistance with medications questions; Collaborated with PCP, CMA, and pharm D regarding new complaint of GI symptoms. Recommendations received and the wife called back with instructions. 04-19-2021: The patient is compliant with recommendations by the pcp; Provided patient with instructions from pcp for new onset of GI symptoms x 3 days educational materials related to new onset of GI symptoms. 04-19-2021: Additional follow up and education today on GI symptoms and if flare up occurs; Pharmacy referral for collaboration, ongoing support, and education for medications reconciliation and management ; Discussed plans with patient for ongoing care management follow up and provided patient with direct contact information for care management team; Advised patient to discuss unresolved GI condition with recommended changes, questions, concerns and sx and sx of worsening condition with provider;   Patient Goals/Self-Care Activities: Patient will self administer medications as prescribed as evidenced by self  report/primary caregiver report  Patient will attend all scheduled provider appointments as evidenced by clinician review of documented attendance to scheduled appointments and patient/caregiver report Patient will call pharmacy for medication refills as evidenced by patient report and review of pharmacy fill history as appropriate Patient will attend church or other social activities as evidenced by patient report Patient will continue to perform ADL's independently as evidenced by patient/caregiver report Patient will continue to perform IADL's independently as evidenced by patient/caregiver report Patient will call provider office for new concerns or questions as evidenced by review of documented incoming telephone call notes and patient report Patient will work with BSW to address care coordination needs and will continue to work with the clinical team to address health care and disease management related needs as evidenced by documented adherence to scheduled care management/care coordination appointments - call office if I gain more than 2 pounds in one day or 5 pounds in one week - keep legs up while sitting - track weight in diary - use salt in moderation - watch for swelling in feet, ankles and legs every day - weigh myself daily - develop a rescue plan - follow rescue plan if symptoms flare-up - know when to call the doctor:for changes in HF sx and sx - track symptoms and what helps feel better or worse - dress right for the weather, hot or cold - schedule appointment with eye doctor - check blood sugar at prescribed times: before meals and at bedtime, when you have symptoms of low or high blood sugar, and before and after exercise - check feet daily for cuts, sores or redness - enter blood sugar readings and medication or insulin into  daily log - take the blood sugar log to all doctor visits - trim toenails straight across - drink 6 to 8 glasses of water each day - eat fish at  least once per week - fill half of plate with vegetables - limit fast food meals to no more than 1 per week - manage portion size - prepare main meal at home 3 to 5 days each week - read food labels for fat, fiber, carbohydrates and portion size - reduce red meat to 2 to 3 times a week - keep feet up while sitting - wash and dry feet carefully every day - wear comfortable, cotton socks - wear comfortable, well-fitting shoes Keep appointments with the podiatrist, next appointment on 01-24-2021 - avoid second hand smoke - eliminate smoking in my home - identify and remove indoor air pollutants - limit outdoor activity during cold weather - listen for public air quality announcements every day - do breathing exercises every day - develop a rescue plan - eliminate symptom triggers at home - follow rescue plan if symptoms flare-up - use an extra pillow to sleep - develop a new routine to improve sleep - don't eat or exercise right before bedtime - eat healthy/prescribed diet: heart healthy/ADA diet  - get at least 7 to 8 hours of sleep at night - use devices that will help like a cane, sock-puller or reacher - practice relaxation or meditation daily - do exercises in a comfortable position that makes breathing as easy as possible - check blood pressure 3 times per week - choose a place to take my blood pressure (home, clinic or office, retail store) - write blood pressure results in a log or diary - learn about high blood pressure - keep a blood pressure log - take blood pressure log to all doctor appointments - call doctor for signs and symptoms of high blood pressure - develop an action plan for high blood pressure - keep all doctor appointments - take medications for blood pressure exactly as prescribed - report new symptoms to your doctor - eat more whole grains, fruits and vegetables, lean meats and healthy fats - call for medicine refill 2 or 3 days before it runs out - take  all medications exactly as prescribed - call doctor with any symptoms you believe are related to your medicine - call doctor when you experience any new symptoms - go to all doctor appointments as scheduled - adhere to prescribed diet: heart healthy/ADA diet        Plan:Telephone follow up appointment with care management team member scheduled for:  06-21-2021 at 0900 am  Noreene Larsson RN, MSN, [facility] Mobile: [phone]

## 2021-04-19 NOTE — Telephone Encounter (Signed)
Sorry for the confusion.  Yes - he should be on ASA given his CAD w/ PCI's in the past few years.

## 2021-04-19 NOTE — Chronic Care Management (AMB) (Signed)
Chronic Care Management CCM Pharmacy Note  04/18/2021 Name:  Lucas Caldwell MRN:  [id number] DOB:  [birth year]  Subjective: Lucas Caldwell is an 88 y.o. year old male who is a primary patient of [name], DO.  The CCM team was consulted for assistance with disease management and care coordination needs.    Receive a call from CCM Nurse Case Manager requesting follow up with patient/wife regarding medication assistance question  Engaged with patient and spouse by telephone for follow up visit for pharmacy case management and/or care coordination services.   Objective:  Medications Reviewed Today     Reviewed by Vanita Ingles, RN (Case Manager) on 04/17/21 at 48  Med List Status: <None>   Medication Order Taking? Sig Documenting Provider Last Dose Status Informant  acetaminophen (TYLENOL) 500 MG tablet [id number] No Take 1,000 mg by mouth every 6 (six) hours as needed for moderate pain or headache.  [provider] Taking Active Spouse/Significant Other  Alcohol Swabs (B-D SINGLE USE SWABS REGULAR) PADS [id number] No Use to check blood sugar up to 2 times daily [name], DO Taking Active Spouse/Significant Other  allopurinol (ZYLOPRIM) 100 MG tablet [id number]  Take 1 tablet (100 mg total) by mouth daily. Bensimhon, Shaune Pascal, MD  Active   aspirin 81 MG chewable tablet [id number] No Chew 81 mg by mouth daily. [provider] Taking Active Spouse/Significant Other  Blood Glucose Monitoring Suppl (TRUE METRIX METER) DEVI [id number] No Use to check blood sugar up to 2 times daily [name], DO Taking Active Spouse/Significant Other  bosutinib (BOSULIF) 100 MG tablet [id number] No Take 100 mg by mouth at bedtime. Take with food. [provider] Taking Active Spouse/Significant Other           Med Note Jeanie Cooks Sep 15, 2019  8:38 AM)    Cholecalciferol 25 MCG (1000 UT) capsule [id number] No Take 1,000 Units  by mouth daily. [provider] Taking Active Spouse/Significant Other  famotidine (PEPCID) 40 MG tablet [id number]  Take 1 tablet (40 mg total) by mouth 2 (two) times daily with a meal. Karamalegos, [name], DO  Active   gabapentin (NEURONTIN) 300 MG capsule [id number] No Take 300 mg by mouth 3 (three) times daily. [provider] Taking Active   Insulin Pen Needle 31G X 5 MM MISC [id number] No 31 g by Does not apply route as directed. [name], NP Taking Active Spouse/Significant Other  isosorbide mononitrate (IMDUR) 60 MG 24 hr tablet [id number] No Take 0.5 tablets (30 mg total) by mouth daily. Theora Gianotti, NP Taking Active Spouse/Significant Other  JARDIANCE 10 MG TABS tablet [id number] No Take 1 tablet (10 mg total) by mouth daily with breakfast. [name], DO Taking Active Spouse/Significant Other  levothyroxine (SYNTHROID) 25 MCG tablet [id number] No TAKE ONE TABLET BY MOUTH EVERY MORNING BEFORE BREAKFAST  Patient taking differently: Take 25 mcg by mouth daily before breakfast.   [name], DO Taking Active Spouse/Significant Other  losartan (COZAAR) 25 MG tablet [id number] No Take 12.5 mg by mouth daily. [provider] Taking Active Spouse/Significant Other  Menthol-Zinc Oxide (GOLD BOND EX) [id number] No Apply 1 application topically daily as needed (muscle pain). [provider] Taking Active Spouse/Significant Other  Multiple Vitamin (MULTIVITAMIN WITH MINERALS) TABS tablet [id number] No Take 1 tablet by mouth daily. [provider] Taking Active Spouse/Significant Other  nitroGLYCERIN (NITROSTAT) 0.4 MG SL tablet [id number] No Place 1  tablet (0.4 mg total) under the tongue every 5 (five) minutes x 3 doses as needed for chest pain. Theora Gianotti, NP Taking Active Spouse/Significant Other  Polyethyl Glycol-Propyl Glycol (SYSTANE OP) [id number] No Place 1 drop into both eyes 3 (three)  times daily as needed (dry/irritated eyes.).  [provider] Taking Active Spouse/Significant Other  polyethylene glycol (MIRALAX / GLYCOLAX) 17 g packet [id number] No Take 17 g by mouth daily as needed for mild constipation.  [provider] Taking Active Spouse/Significant Other  ranolazine (RANEXA) 500 MG 12 hr tablet [id number] No TAKE ONE TABLET BY MOUTH TWICE DAILY  Patient taking differently: Take 500 mg by mouth 2 (two) times daily.   End, [name], MD Taking Active Spouse/Significant Other  rosuvastatin (CRESTOR) 5 MG tablet [id number] No TAKE ONE TABLET BY MOUTH ONCE DAILY  Patient taking differently: Take 5 mg by mouth daily.   End, [name], MD Taking Active Spouse/Significant Other  tamsulosin (FLOMAX) 0.4 MG CAPS capsule [id number] No Take 1 capsule (0.4 mg total) by mouth daily after supper. Rise Mu, PA-C Taking Active Spouse/Significant Other  torsemide (DEMADEX) 20 MG tablet [id number] No Take 2 tablets (40 mg total) by mouth daily. Darrick Grinder D, NP Taking Active   traMADol (ULTRAM) 50 MG tablet [id number] No Take 1-2 tablets (50-100 mg total) by mouth at bedtime as needed. [name], DO Taking Active Spouse/Significant Other  TRULICITY 1.5 BO/1.7PZ SOPN [id number] No Inject 1.5 mg into the skin once a week. [name], DO Taking Active Spouse/Significant Other           Med Note (NEWCOMER MCCLAIN, BRANDY L   Thu Jun 29, 2020  1:45 PM)    warfarin (COUMADIN) 3 MG tablet [id number] No TAKE 1 TO 1 & 1/2 TABLETS BY MOUTH AS DIRECTED BY THE COUMADIN CLINIC End, [name], MD Taking Active             Pertinent Labs:  Lab Results  Component Value Date   HGBA1C 7.3 (H) 02/21/2021   Lab Results  Component Value Date   CHOL 123 06/29/2020   HDL 29 (L) 06/29/2020   LDLCALC 65 06/29/2020   TRIG 170 (H) 06/29/2020   CHOLHDL 4.2 06/29/2020   Lab Results  Component Value Date   CREATININE 1.59 (H) 03/23/2021   BUN 28 (H)  03/23/2021   NA 137 03/23/2021   K 3.8 03/23/2021   CL 108 03/23/2021   CO2 22 03/23/2021    SDOH:  (Social Determinants of Health) assessments and interventions performed:    [place]  Review of patient past medical history, allergies, medications, health status, including review of consultants reports, laboratory and other test data, was performed as part of comprehensive evaluation and provision of chronic care management services.   Care Plan : PharmD - Medication Management/Assistance  Updates made by Rennis Petty, RPH-CPP since 04/19/2021 12:00 AM     Problem: Disease Progression      Long-Range Goal: Disease Progression Prevented or Minimized   Start Date: 04/14/2020  Expected End Date: 07/13/2020  This Visit's Progress: On track  Recent Progress: On track  Priority: High  Note:   Current Barriers:  Chronic Disease Management support, education, and care coordination needs related to type 2 diabetes, CKD, HFrEF s/p CardioMEMS, hyperlipidemia, atrial fibrillation, hypertension, coronary artery disease, CHF, hypothyroidism, and CML Financial Note patient has Partial Extra Help/LIS Subsidy Patient enrolled in [place] [facility] health practitioner and Trulicity) patient assistance program for 2022 calendar year Patient  denied for patient assistance for Jar[place]ance from [facility] for 2022 calendar year Patient enrolled in [place] for Jardiance copayment assistance (eligibility through 08/28/2021) Limited vision  Pharmacist Clinical Goal(s):  Over the next 90 days, patient will verbalize ability to afford treatment regimen through collaboration with PharmD and provider.   Interventions: 1:1 collaboration with [name], DO regarding development and update of comprehensive plan of care as evidenced by provider attestation and co-signature Inter-disciplinary care team collaboration (see longitudinal plan of care) Receive message from [facility]  Nurse Case Manager advising patient requesting a call regarding medication assistance Perform chart review Patient seen for Office Visit with PCP on 1/27 for diarrhea. Provider advised: For Prevention - start OTC Peppermint Oil (Triple Coated Capsule) 180mg  take one 3 times daily to reduce diarrhea Use Imodium AD as needed for diarrhea. Limit the caffeine intake green tea and greasy/fried foods Stop Omeprazole 20mg  x 2 and switch to new med. This was interacting with the cancer medicine. START new rx Famotidine 40mg  TWICE a day every day for stomach acid. Office Visit with [place] on 1/27  Patient received steroid shoulder injection Provider recommended physical therapy and a home exercise program Office Visit with [facility] on 1/5 Today patient's wife confirms patient stopped omeprazole and is now taking famotidine 40 mg twice daily as directed. Counsel patient/spouse for patient to take famotidine doses ~30 minutes prior to breakfast and supper, while separating famotidine by at least 2 hours from bosutinib. Reports patient continues to use Imodium AD as needed for diarrhea Review counseling from PCP for patient to limit caffeine intake, green tea and greasy/fried foods Reports has not yet started peppermint oil as recommended by PCP, but will follow up with pharmacy to see if available there or can be ordered Place call to [facility] Hematology Oncology and speak with Juliann Pulse to let team know that patient no longer taking omeprazole, now started on famotidine twice daily by PCP  Note patient previously on omeprazole chronically for >3 years and omeprazole can impact (decrease) serum concentration of bosutinib Notify Oncology team in case further monitoring/adjustment needed with therapy change Place call to [facility] for clarification on whether patient to continue aspirin 81 mg daily From review of assessment of patient's CAD from latest Office Visit note  from [facility] on 11/30, provider noted patient to remains on statin and aspirin therapy, but also states No aspirin in the setting of chronic warfarin [name] reports at dental appointment today, patient advised to have teeth pulled (scheduled for 2/15) Reports she has already called patient's [facility] today to request directions if any adjustment to warfarin schedule needed around this dental procedure  Medication Assistance: Let patient's wife know, per message from [place] on 12/28, patient was APPROVED 03/18/21-03/17/22 for re-enrollment in [place] patient assistance program for [employer] and next refills should be shipped based on timing of last refill in 2022 Spouse reports patient currently has 4 boxes of Trulicity and 2 boxes of [name] remaining Confirm patient/spouse have phone number for assistance program  Type 2 Diabetes: Controlled; current treatment: [name] 18 units daily Trulicity 1.5 mg once weekly Jardiance 10 mg once daily in morning Patient reports recent fasting blood sugars ranging: 130s-150s Denies hypoglycemia Have encouraged patient to have regular well-balanced meals   Patient Goals/Self-Care Activities Over the next 90 days, patient will:  Check blood sugar, blood pressure and fluid status as directed by providers  Take medications, with  assistance of wife, as directed Mrs. Bencomo manages patient's medications using weekly pillbox as adherence tool Attends scheduled medical appointments  Follow Up Plan: Telephone follow up appointment with care management team member scheduled for: 05/21/2021 at 10:45 am       [name], PharmD, Para [place], [facility] [place] [phone]

## 2021-04-20 ENCOUNTER — Telehealth: Payer: Self-pay

## 2021-04-20 DIAGNOSIS — M1030 Gout due to renal impairment, unspecified site: Secondary | ICD-10-CM | POA: Diagnosis not present

## 2021-04-20 DIAGNOSIS — Z4781 Encounter for orthopedic aftercare following surgical amputation: Secondary | ICD-10-CM | POA: Diagnosis not present

## 2021-04-20 DIAGNOSIS — C9210 Chronic myeloid leukemia, BCR/ABL-positive, not having achieved remission: Secondary | ICD-10-CM | POA: Diagnosis not present

## 2021-04-20 DIAGNOSIS — I951 Orthostatic hypotension: Secondary | ICD-10-CM | POA: Diagnosis not present

## 2021-04-20 DIAGNOSIS — D631 Anemia in chronic kidney disease: Secondary | ICD-10-CM | POA: Diagnosis not present

## 2021-04-20 DIAGNOSIS — I255 Ischemic cardiomyopathy: Secondary | ICD-10-CM | POA: Diagnosis not present

## 2021-04-20 DIAGNOSIS — I130 Hypertensive heart and chronic kidney disease with heart failure and stage 1 through stage 4 chronic kidney disease, or unspecified chronic kidney disease: Secondary | ICD-10-CM | POA: Diagnosis not present

## 2021-04-20 DIAGNOSIS — M86071 Acute hematogenous osteomyelitis, right ankle and foot: Secondary | ICD-10-CM | POA: Diagnosis not present

## 2021-04-20 DIAGNOSIS — E1122 Type 2 diabetes mellitus with diabetic chronic kidney disease: Secondary | ICD-10-CM | POA: Diagnosis not present

## 2021-04-20 DIAGNOSIS — G43909 Migraine, unspecified, not intractable, without status migrainosus: Secondary | ICD-10-CM | POA: Diagnosis not present

## 2021-04-20 DIAGNOSIS — I4510 Unspecified right bundle-branch block: Secondary | ICD-10-CM | POA: Diagnosis not present

## 2021-04-20 DIAGNOSIS — I25118 Atherosclerotic heart disease of native coronary artery with other forms of angina pectoris: Secondary | ICD-10-CM | POA: Diagnosis not present

## 2021-04-20 DIAGNOSIS — H5461 Unqualified visual loss, right eye, normal vision left eye: Secondary | ICD-10-CM | POA: Diagnosis not present

## 2021-04-20 DIAGNOSIS — I5042 Chronic combined systolic (congestive) and diastolic (congestive) heart failure: Secondary | ICD-10-CM | POA: Diagnosis not present

## 2021-04-20 DIAGNOSIS — E1169 Type 2 diabetes mellitus with other specified complication: Secondary | ICD-10-CM | POA: Diagnosis not present

## 2021-04-20 DIAGNOSIS — M1990 Unspecified osteoarthritis, unspecified site: Secondary | ICD-10-CM | POA: Diagnosis not present

## 2021-04-20 DIAGNOSIS — D509 Iron deficiency anemia, unspecified: Secondary | ICD-10-CM | POA: Diagnosis not present

## 2021-04-20 DIAGNOSIS — J432 Centrilobular emphysema: Secondary | ICD-10-CM | POA: Diagnosis not present

## 2021-04-20 DIAGNOSIS — I480 Paroxysmal atrial fibrillation: Secondary | ICD-10-CM | POA: Diagnosis not present

## 2021-04-20 DIAGNOSIS — E871 Hypo-osmolality and hyponatremia: Secondary | ICD-10-CM | POA: Diagnosis not present

## 2021-04-20 DIAGNOSIS — N184 Chronic kidney disease, stage 4 (severe): Secondary | ICD-10-CM | POA: Diagnosis not present

## 2021-04-20 DIAGNOSIS — D630 Anemia in neoplastic disease: Secondary | ICD-10-CM | POA: Diagnosis not present

## 2021-04-20 DIAGNOSIS — G4733 Obstructive sleep apnea (adult) (pediatric): Secondary | ICD-10-CM | POA: Diagnosis not present

## 2021-04-20 DIAGNOSIS — Z89411 Acquired absence of right great toe: Secondary | ICD-10-CM | POA: Diagnosis not present

## 2021-04-20 DIAGNOSIS — N400 Enlarged prostate without lower urinary tract symptoms: Secondary | ICD-10-CM | POA: Diagnosis not present

## 2021-04-20 NOTE — Patient Instructions (Signed)
Visit Information  Thank you for taking time to visit with me today. Please don't hesitate to contact me if I can be of assistance to you before our next scheduled telephone appointment.  Following are the goals we discussed today:   Goals Addressed             This Visit's Progress    Pharmacy Goals       Please check your blood sugar, keep a log of the results and bring this with you to your medical appointments.  Please check your home blood pressure, keep a log of the results and bring this with you to your medical appointments.  Our goal bad cholesterol, or LDL, is less than 70 . This is why it is important to continue taking your rosuvastatin.  Feel free to call me with any questions or concerns. I look forward to our next call!   [name], PharmD, [place] [phone]         Our next appointment is by telephone on 05/21/2021 at 10:45 am  Please call the care guide team at [phone] if you need to cancel or reschedule your appointment.    The patient's wife verbalized understanding of instructions, educational materials, and care plan provided today and declined offer to receive copy of patient instructions, educational materials, and care plan.

## 2021-04-20 NOTE — Telephone Encounter (Signed)
° °  Telephone encounter was:  Successful.  04/20/2021 Name: [name] MRN: [id number] DOB: [birth year]  Lucas Caldwell is a 88 y.o. year old male who is a primary care patient of [name], DO . The community resource team was consulted for assistance with  bathroom handrails  Care guide performed the following interventions: Patient provided with information about care guide support team and interviewed to confirm resource needs.Spoke with patient and gave him resource information and contact number over the phone to contact for handrails for bathroom. I will also mail the resouces to him  Follow Up Plan:  Care guide will follow up with patient by phone over the next week    [place], Care Management  [phone] [address], [place], [place] [address] Phone: [phone] Email: [email or web address]

## 2021-04-20 NOTE — Telephone Encounter (Signed)
Attempted to call Benjamine Mola (pharmacy) back at [facility] with [name], NP recommendations/ clarification regarding ASA.  No answer- I left a detailed message on Elizabeth's identified voice mail regarding [name], NP's recommendations as stated below.  I asked that she call back with any further questions/ concerns.

## 2021-04-24 DIAGNOSIS — I255 Ischemic cardiomyopathy: Secondary | ICD-10-CM | POA: Diagnosis not present

## 2021-04-24 DIAGNOSIS — M1030 Gout due to renal impairment, unspecified site: Secondary | ICD-10-CM | POA: Diagnosis not present

## 2021-04-24 DIAGNOSIS — D631 Anemia in chronic kidney disease: Secondary | ICD-10-CM | POA: Diagnosis not present

## 2021-04-24 DIAGNOSIS — N184 Chronic kidney disease, stage 4 (severe): Secondary | ICD-10-CM | POA: Diagnosis not present

## 2021-04-24 DIAGNOSIS — E1122 Type 2 diabetes mellitus with diabetic chronic kidney disease: Secondary | ICD-10-CM | POA: Diagnosis not present

## 2021-04-24 DIAGNOSIS — D630 Anemia in neoplastic disease: Secondary | ICD-10-CM | POA: Diagnosis not present

## 2021-04-24 DIAGNOSIS — E871 Hypo-osmolality and hyponatremia: Secondary | ICD-10-CM | POA: Diagnosis not present

## 2021-04-24 DIAGNOSIS — M1990 Unspecified osteoarthritis, unspecified site: Secondary | ICD-10-CM | POA: Diagnosis not present

## 2021-04-24 DIAGNOSIS — D509 Iron deficiency anemia, unspecified: Secondary | ICD-10-CM | POA: Diagnosis not present

## 2021-04-24 DIAGNOSIS — I4510 Unspecified right bundle-branch block: Secondary | ICD-10-CM | POA: Diagnosis not present

## 2021-04-24 DIAGNOSIS — I130 Hypertensive heart and chronic kidney disease with heart failure and stage 1 through stage 4 chronic kidney disease, or unspecified chronic kidney disease: Secondary | ICD-10-CM | POA: Diagnosis not present

## 2021-04-24 DIAGNOSIS — J432 Centrilobular emphysema: Secondary | ICD-10-CM | POA: Diagnosis not present

## 2021-04-24 DIAGNOSIS — I480 Paroxysmal atrial fibrillation: Secondary | ICD-10-CM | POA: Diagnosis not present

## 2021-04-24 DIAGNOSIS — M86071 Acute hematogenous osteomyelitis, right ankle and foot: Secondary | ICD-10-CM | POA: Diagnosis not present

## 2021-04-24 DIAGNOSIS — I5042 Chronic combined systolic (congestive) and diastolic (congestive) heart failure: Secondary | ICD-10-CM | POA: Diagnosis not present

## 2021-04-24 DIAGNOSIS — N400 Enlarged prostate without lower urinary tract symptoms: Secondary | ICD-10-CM | POA: Diagnosis not present

## 2021-04-24 DIAGNOSIS — H5461 Unqualified visual loss, right eye, normal vision left eye: Secondary | ICD-10-CM | POA: Diagnosis not present

## 2021-04-24 DIAGNOSIS — I951 Orthostatic hypotension: Secondary | ICD-10-CM | POA: Diagnosis not present

## 2021-04-24 DIAGNOSIS — G4733 Obstructive sleep apnea (adult) (pediatric): Secondary | ICD-10-CM | POA: Diagnosis not present

## 2021-04-24 DIAGNOSIS — Z4781 Encounter for orthopedic aftercare following surgical amputation: Secondary | ICD-10-CM | POA: Diagnosis not present

## 2021-04-24 DIAGNOSIS — G43909 Migraine, unspecified, not intractable, without status migrainosus: Secondary | ICD-10-CM | POA: Diagnosis not present

## 2021-04-24 DIAGNOSIS — I25118 Atherosclerotic heart disease of native coronary artery with other forms of angina pectoris: Secondary | ICD-10-CM | POA: Diagnosis not present

## 2021-04-24 DIAGNOSIS — E1169 Type 2 diabetes mellitus with other specified complication: Secondary | ICD-10-CM | POA: Diagnosis not present

## 2021-04-24 DIAGNOSIS — Z89411 Acquired absence of right great toe: Secondary | ICD-10-CM | POA: Diagnosis not present

## 2021-04-24 DIAGNOSIS — C9210 Chronic myeloid leukemia, BCR/ABL-positive, not having achieved remission: Secondary | ICD-10-CM | POA: Diagnosis not present

## 2021-04-25 ENCOUNTER — Telehealth: Payer: Self-pay

## 2021-04-25 DIAGNOSIS — M12811 Other specific arthropathies, not elsewhere classified, right shoulder: Secondary | ICD-10-CM | POA: Diagnosis not present

## 2021-04-25 DIAGNOSIS — M12812 Other specific arthropathies, not elsewhere classified, left shoulder: Secondary | ICD-10-CM | POA: Diagnosis not present

## 2021-04-25 NOTE — Telephone Encounter (Signed)
° °  Telephone encounter was:  Unsuccessful.  04/25/2021 Name: Lucas Caldwell MRN: [id number] DOB: [birth year]  Unsuccessful outbound call made today to assist with:  Home Modifications. Follow up call   A HIPAA compliant voice message was left requesting a return call.  Instructed patient to call back at  earliest convenience .    [facility] [facility] [place], Care Management  [phone] [address], [place], [place] [address] Phone: [phone] Email: [email or web address]

## 2021-04-28 DIAGNOSIS — E1122 Type 2 diabetes mellitus with diabetic chronic kidney disease: Secondary | ICD-10-CM | POA: Diagnosis not present

## 2021-04-28 DIAGNOSIS — E785 Hyperlipidemia, unspecified: Secondary | ICD-10-CM | POA: Diagnosis not present

## 2021-04-28 DIAGNOSIS — R32 Unspecified urinary incontinence: Secondary | ICD-10-CM | POA: Diagnosis not present

## 2021-04-28 DIAGNOSIS — K219 Gastro-esophageal reflux disease without esophagitis: Secondary | ICD-10-CM | POA: Diagnosis not present

## 2021-04-28 DIAGNOSIS — E1159 Type 2 diabetes mellitus with other circulatory complications: Secondary | ICD-10-CM | POA: Diagnosis not present

## 2021-04-28 DIAGNOSIS — Z7982 Long term (current) use of aspirin: Secondary | ICD-10-CM | POA: Diagnosis not present

## 2021-04-28 DIAGNOSIS — E039 Hypothyroidism, unspecified: Secondary | ICD-10-CM | POA: Diagnosis not present

## 2021-04-28 DIAGNOSIS — Z008 Encounter for other general examination: Secondary | ICD-10-CM | POA: Diagnosis not present

## 2021-04-28 DIAGNOSIS — E1142 Type 2 diabetes mellitus with diabetic polyneuropathy: Secondary | ICD-10-CM | POA: Diagnosis not present

## 2021-04-28 DIAGNOSIS — I4891 Unspecified atrial fibrillation: Secondary | ICD-10-CM | POA: Diagnosis not present

## 2021-04-28 DIAGNOSIS — G8929 Other chronic pain: Secondary | ICD-10-CM | POA: Diagnosis not present

## 2021-04-28 DIAGNOSIS — Z794 Long term (current) use of insulin: Secondary | ICD-10-CM | POA: Diagnosis not present

## 2021-04-28 DIAGNOSIS — I25119 Atherosclerotic heart disease of native coronary artery with unspecified angina pectoris: Secondary | ICD-10-CM | POA: Diagnosis not present

## 2021-04-30 ENCOUNTER — Other Ambulatory Visit: Payer: Self-pay | Admitting: Internal Medicine

## 2021-04-30 ENCOUNTER — Telehealth: Payer: Self-pay | Admitting: Internal Medicine

## 2021-04-30 NOTE — Telephone Encounter (Signed)
Patient with diagnosis of afib on warfarin for anticoagulation.    Procedure: 3 teeth extraction Date of procedure: 05/02/21  CHA2DS2-VASc Score = 6   This indicates a 9.7% annual risk of stroke. The patient's score is based upon: CHF History: 1 HTN History: 1 Diabetes History: 1 Stroke History: 0 Vascular Disease History: 1 Age Score: 2 Gender Score: 0       Patient does NOT require pre-op antibiotics for dental procedure.  Per office protocol, patient can hold warfarin for 3-5 days prior to procedure.    He will NOT need bridge.

## 2021-04-30 NOTE — Telephone Encounter (Signed)
° °  Patient Name: Lucas Caldwell  DOB: 10-Dec-1933 MRN: [id number]  Primary Cardiologist: [name], MD  Chart reviewed as part of pre-operative protocol coverage. I reached out to patient for update on how he is doing. I was able to reach him and his wife on wife's Lucas Caldwell's cell phone [phone]. They affirms he has been doing well without any new cardiac symptoms. Therefore, based on ACC/AHA guidelines, the patient would be at acceptable risk for the planned procedure without further cardiovascular testing. The patient was advised that if he develops new symptoms prior to surgery to contact our office to arrange for a follow-up visit, and he verbalized understanding.  Per review with pharmacy team, patient can hold warfarin for 3-5 days prior to procedure. Lucas Caldwell states the patient got his Coumadin yesterday so would only be holding for 2 days prior to procedure scheduled on 05/02/21. I will route to our callback staff to call dentist's office to review and recommend they advise patient on whether they want to still proceed with extractions on 05/02/21 as scheduled or reschedule patient to ensure the adequate 3-5 day hold. Lucas Caldwell recommends calling her cell phone back, rather than the numbers listed ([phone]).  Patient does not require SBE prophylaxis from cardiac standpoint. Will also route this bundled recommendation to requesting provider via Epic fax function. Please call with questions.   Charlie Pitter, PA-C 04/30/2021, 3:35 PM

## 2021-04-30 NOTE — Telephone Encounter (Signed)
Will route to pharm (notified ASAP clearance), then pt will need call. Last OV 01/2021. On warfarin for PAF. Based on history do not see indication for SBE ppx.

## 2021-04-30 NOTE — Telephone Encounter (Signed)
I s/w the dental office and and confirmed dentist will be able to proceed with procedure, though pt will have held coumadin x 2 days and not 3-5. I will fax clearance notes to dental office.   I also called the pt's wife as instructed and she is aware DDS office said still ok to proceed. She is aware to not have pt take any more coumadin after his dental procedure.

## 2021-04-30 NOTE — Telephone Encounter (Signed)
° °  Pre-operative Risk Assessment    Patient Name: Lucas Caldwell  DOB: 05-11-1933 MRN: [id number]     Request for Surgical Clearance    Procedure:  Dental Extraction - Amount of Teeth to be Pulled:  3  Date of Surgery:  Clearance 05/02/21                                 Surgeon:  Kennis Carina  Surgeon's Group or Practice Name:  [facility]  Phone number:  [phone] Fax number:  [phone]   Type of Clearance Requested:   - Pharmacy:  Hold Warfarin (Coumadin)     Type of Anesthesia:  Not Indicated   Additional requests/questions:  patient wife calling and states she is currently holding warfarin  [name]   04/30/2021, 2:28 PM

## 2021-05-01 DIAGNOSIS — G4733 Obstructive sleep apnea (adult) (pediatric): Secondary | ICD-10-CM | POA: Diagnosis not present

## 2021-05-01 DIAGNOSIS — H1045 Other chronic allergic conjunctivitis: Secondary | ICD-10-CM | POA: Diagnosis not present

## 2021-05-01 DIAGNOSIS — H35342 Macular cyst, hole, or pseudohole, left eye: Secondary | ICD-10-CM | POA: Diagnosis not present

## 2021-05-01 DIAGNOSIS — H35372 Puckering of macula, left eye: Secondary | ICD-10-CM | POA: Diagnosis not present

## 2021-05-01 DIAGNOSIS — H18599 Other hereditary corneal dystrophies, unspecified eye: Secondary | ICD-10-CM | POA: Diagnosis not present

## 2021-05-01 DIAGNOSIS — E119 Type 2 diabetes mellitus without complications: Secondary | ICD-10-CM | POA: Diagnosis not present

## 2021-05-01 DIAGNOSIS — Z0100 Encounter for examination of eyes and vision without abnormal findings: Secondary | ICD-10-CM | POA: Diagnosis not present

## 2021-05-01 LAB — HM DIABETES EYE EXAM

## 2021-05-03 ENCOUNTER — Telehealth: Payer: Self-pay | Admitting: Internal Medicine

## 2021-05-03 NOTE — Telephone Encounter (Signed)
Spoke with wife [place].  Pt had dental extraction yesterday.  Bleeding has stopped.  Informed it is ok for pt to restart warfarin tonight.  She verbalized understanding.

## 2021-05-03 NOTE — Telephone Encounter (Signed)
Patient had some dental extractions yesterday  Would like to clarify when to start back on coumadin Please call [name] to discuss

## 2021-05-07 ENCOUNTER — Other Ambulatory Visit: Payer: Self-pay | Admitting: Internal Medicine

## 2021-05-07 ENCOUNTER — Ambulatory Visit: Payer: Self-pay

## 2021-05-07 ENCOUNTER — Encounter: Payer: Self-pay | Admitting: *Deleted

## 2021-05-07 LAB — SUSCEPTIBILITY RESULT

## 2021-05-07 NOTE — Telephone Encounter (Signed)
Spoke with his wife about recommendations. She seemed frustrated that we cannot offer much else except a GI referral for their specialty in symptoms he is having. She said she was driving and would call back about having a referral placed.

## 2021-05-07 NOTE — Telephone Encounter (Signed)
[name] calling back to see if PCP responded. Wife reports patient medication that was prescribed is not working. Patient can tolerate fluids. C/o dizziness at times but does not want to go to UC due to recommending to call PCP. Patient requesting call back before 2 pm today for any advise or if able to be seen in office today or tomorrow. Wife has appt. Today at 2 pm and will not be able to answer phone. Requesting advise . Does not want to wait until Wednesday for appt. Patient was told to call back if no better. Please advise .

## 2021-05-07 NOTE — Telephone Encounter (Signed)
Patient requested call back before 2pm  We have seen Mr Fiorito for this same diarrhea complaint before about 1 month ago.  I would not need to see him again so soon for same issue. Unfortunately there is no other medicine I have for diarrhea. No other rx that I would recommend.  Same plan as before is what I can do:  Prevention - OTC Peppermint Oil (Triple Coated Capsule) 180mg  take one 3 times daily to reduce diarrhea  Imodium AD as needed for diarrhea. Limit the caffeine intake green tea and greasy/fried foods  If it is still an issue, then GI specialist would be next step.  Let me know if they want a referral.  [name], [facility] 05/07/2021, 1:32 PM

## 2021-05-07 NOTE — Telephone Encounter (Signed)
Pt missed last INR check on 05/02/21, called spoke with pt's wife rescheduled missed appt to 05/09/21 at 3:45pm in [facility].  Wife states pt has enough Warfarin to get to upcoming appt on Wednesday.  Advised we will refill at OV.

## 2021-05-07 NOTE — Telephone Encounter (Signed)
Refill request

## 2021-05-07 NOTE — Telephone Encounter (Signed)
This encounter was created in error - please disregard.

## 2021-05-07 NOTE — Telephone Encounter (Signed)
Pt's wife called saying patient has been having diarrhea for at least a week.  Nothing is really helping/  She wanted to schd an appt but there was nothing until wed.    Chief Complaint: Having diarrhea - "more than usual" Saw PCP last month. Symptoms: Feels "sick" Frequency: Last week Pertinent Negatives: Patient denies abdominal pain Disposition: [] ED /[] Urgent Care (no appt availability in office) / [] Appointment(In office/virtual)/ []  [facility]/ [] Home Care/ [] Refused Recommended Disposition /[] [facility]/  Follow-up with PCP Additional Notes: Taking Imodium and peppermint oil. Wants to know what else he can take. Please advise.   Answer Assessment - Initial Assessment Questions 1. DIARRHEA SEVERITY: "How bad is the diarrhea?" "How many more stools have you had in the past 24 hours than normal?"    - NO DIARRHEA (SCALE 0)   - MILD (SCALE 1-3): Few loose or mushy BMs; increase of 1-3 stools over normal daily number of stools; mild increase in ostomy output.   -  MODERATE (SCALE 4-7): Increase of 4-6 stools daily over normal; moderate increase in ostomy output. * SEVERE (SCALE 8-10; OR 'WORST POSSIBLE'): Increase of 7 or more stools daily over normal; moderate increase in ostomy output; incontinence.     Moderate 2. ONSET: "When did the diarrhea begin?"      1week ago3. BM CONSISTENCY: "How loose or watery is the diarrhea?"      Water 4. VOMITING: "Are you also vomiting?" If Yes, ask: "How many times in the past 24 hours?"      No 5. ABDOMINAL PAIN: "Are you having any abdominal pain?" If Yes, ask: "What does it feel like?" (e.g., crampy, dull, intermittent, constant)      No 6. ABDOMINAL PAIN SEVERITY: If present, ask: "How bad is the pain?"  (e.g., Scale 1-10; mild, moderate, or severe)   - MILD (1-3): doesn't interfere with normal activities, abdomen soft and not tender to touch    - MODERATE (4-7): interferes with normal activities or awakens from sleep,  abdomen tender to touch    - SEVERE (8-10): excruciating pain, doubled over, unable to do any normal activities       Mild 7. ORAL INTAKE: If vomiting, "Have you been able to drink liquids?" "How much liquids have you had in the past 24 hours?"     Yes 8. HYDRATION: "Any signs of dehydration?" (e.g., dry mouth [not just dry lips], too weak to stand, dizziness, new weight loss) "When did you last urinate?"     No 9. EXPOSURE: "Have you traveled to a foreign country recently?" "Have you been exposed to anyone with diarrhea?" "Could you have eaten any food that was spoiled?"     No 10. ANTIBIOTIC USE: "Are you taking antibiotics now or have you taken antibiotics in the past 2 months?"       No 11. OTHER SYMPTOMS: "Do you have any other symptoms?" (e.g., fever, blood in stool)       Feels sick, weak 12. PREGNANCY: "Is there any chance you are pregnant?" "When was your last menstrual period?"       N/a  Protocols used: [facility]

## 2021-05-09 ENCOUNTER — Other Ambulatory Visit: Payer: Self-pay

## 2021-05-09 ENCOUNTER — Ambulatory Visit (INDEPENDENT_AMBULATORY_CARE_PROVIDER_SITE_OTHER): Payer: Medicare HMO

## 2021-05-09 DIAGNOSIS — I480 Paroxysmal atrial fibrillation: Secondary | ICD-10-CM | POA: Diagnosis not present

## 2021-05-09 DIAGNOSIS — Z7901 Long term (current) use of anticoagulants: Secondary | ICD-10-CM

## 2021-05-09 LAB — POCT INR: INR: 1.7 — AB (ref 2.0–3.0)

## 2021-05-09 NOTE — Patient Instructions (Signed)
-   take 2 tablets warfarin tonight, then - continue dosage of warfarin of 1.5 TABLETS EVERY DAY EXCEPT 1 TABLETS ON MONDAYS & FRIDAYS. - recheck INR in 4 weeks

## 2021-05-11 ENCOUNTER — Ambulatory Visit: Payer: Medicare HMO | Admitting: Pharmacist

## 2021-05-11 DIAGNOSIS — E1121 Type 2 diabetes mellitus with diabetic nephropathy: Secondary | ICD-10-CM

## 2021-05-11 DIAGNOSIS — Z794 Long term (current) use of insulin: Secondary | ICD-10-CM

## 2021-05-11 NOTE — Patient Instructions (Signed)
Visit Information  Thank you for taking time to visit with me today. Please don't hesitate to contact me if I can be of assistance to you before our next scheduled telephone appointment.  Following are the goals we discussed today:   Goals Addressed             This Visit's Progress    Pharmacy Goals       Please check your blood sugar, keep a log of the results and bring this with you to your medical appointments.  Please check your home blood pressure, keep a log of the results and bring this with you to your medical appointments.  Our goal bad cholesterol, or LDL, is less than 70 . This is why it is important to continue taking your rosuvastatin.  Feel free to call me with any questions or concerns. I look forward to our next call!  [name], PharmD, [facility] [phone]         Our next appointment is by telephone on 05/21/2021 at 10:45 am  Please call the care guide team at [phone] if you need to cancel or reschedule your appointment.    The patient verbalized understanding of instructions, educational materials, and care plan provided today and declined offer to receive copy of patient instructions, educational materials, and care plan.

## 2021-05-11 NOTE — Chronic Care Management (AMB) (Signed)
Chronic Care Management CCM Pharmacy Note  05/11/2021 Name:  TUKKER BYRNS MRN:  [id number] DOB:  11-Jun-1933   Subjective: NAMISH KRISE is an 88 y.o. year old male who is a primary patient of [name], DO.  The CCM team was consulted for assistance with disease management and care coordination needs.    Receive notification from [facility] that patient's refill of Jardiance might be overdue (or latest refill not billed through grant). To keep his [place] active, must request and receive payment for a claim from [employer] for [place] by 06/09/2021  Engaged with patient's spouse by telephone for follow up visit for pharmacy case management and/or care coordination services.   Objective:  Medications    Reviewed by Vanita Ingles, RN (Case Manager) on 04/19/21 at 1151  Med List Status: <None>   Medication Order Taking? Sig Documenting Provider Last Dose Status Informant  acetaminophen (TYLENOL) 500 MG tablet [id number] No Take 1,000 mg by mouth every 6 (six) hours as needed for moderate pain or headache.  [provider] Taking Active Spouse/Significant Other  Alcohol Swabs (B-D SINGLE USE SWABS REGULAR) PADS [id number] No Use to check blood sugar up to 2 times daily [name], DO Taking Active Spouse/Significant Other  allopurinol (ZYLOPRIM) 100 MG tablet [id number]  Take 1 tablet (100 mg total) by mouth daily. Bensimhon, Shaune Pascal, MD  Active   aspirin 81 MG chewable tablet [id number] No Chew 81 mg by mouth daily. [provider] Taking Active Spouse/Significant Other  Blood Glucose Monitoring Suppl (TRUE METRIX METER) DEVI [id number] No Use to check blood sugar up to 2 times daily [name], DO Taking Active Spouse/Significant Other  bosutinib (BOSULIF) 100 MG tablet [id number] No Take 100 mg by mouth at bedtime. Take with food. [provider] Taking Active Spouse/Significant  Other           Med Note Jeanie Cooks Sep 15, 2019  8:38 AM)    Cholecalciferol 25 MCG (1000 UT) capsule [id number] No Take 1,000 Units by mouth daily. [provider] Taking Active Spouse/Significant Other  famotidine (PEPCID) 40 MG tablet [id number] No Take 1 tablet (40 mg total) by mouth 2 (two) times daily with a meal. Karamalegos, [name], DO Taking Active   gabapentin (NEURONTIN) 300 MG capsule [id number] No Take 300 mg by mouth 3 (three) times daily. [provider] Taking Active   Insulin Glargine ([name] KWIKPEN) 100 UNIT/ML [id number] No Inject 18 Units into the skin daily. [provider] Taking Active   Insulin Pen Needle 31G X 5 MM MISC [id number] No 31 g by Does not apply route as directed. [name], NP Taking Active Spouse/Significant Other  isosorbide mononitrate (IMDUR) 60 MG 24 hr tablet [id number] No Take 0.5 tablets (30 mg total) by mouth daily. Theora Gianotti, NP Taking Active Spouse/Significant Other  JARDIANCE 10 MG TABS tablet [id number] No Take 1 tablet (10 mg total) by mouth daily with breakfast. [name], DO Taking Active Spouse/Significant Other  levothyroxine (SYNTHROID) 25 MCG tablet [id number] No TAKE ONE TABLET BY MOUTH EVERY MORNING BEFORE BREAKFAST  Patient taking differently: Take 25 mcg by mouth daily before breakfast.   [name], DO Taking Active Spouse/Significant Other  losartan (COZAAR) 25 MG tablet [id number] No Take 12.5 mg by mouth daily. [provider] Taking Active Spouse/Significant Other  Menthol-Zinc Oxide (GOLD BOND EX) [id number] No Apply 1 application topically  daily as needed (muscle pain). [provider] Taking Active Spouse/Significant Other  Multiple Vitamin (MULTIVITAMIN WITH MINERALS) TABS tablet [id number] No Take 1 tablet by mouth daily. [provider] Taking Active Spouse/Significant Other  nitroGLYCERIN (NITROSTAT) 0.4 MG  SL tablet [id number] No Place 1 tablet (0.4 mg total) under the tongue every 5 (five) minutes x 3 doses as needed for chest pain. Theora Gianotti, NP Taking Active Spouse/Significant Other  Polyethyl Glycol-Propyl Glycol (SYSTANE OP) [id number] No Place 1 drop into both eyes 3 (three) times daily as needed (dry/irritated eyes.).  [provider] Taking Active Spouse/Significant Other  polyethylene glycol (MIRALAX / GLYCOLAX) 17 g packet [id number] No Take 17 g by mouth daily as needed for mild constipation.  [provider] Taking Active Spouse/Significant Other  ranolazine (RANEXA) 500 MG 12 hr tablet [id number] No TAKE ONE TABLET BY MOUTH TWICE DAILY  Patient taking differently: Take 500 mg by mouth 2 (two) times daily.   End, [name], MD Taking Active Spouse/Significant Other  rosuvastatin (CRESTOR) 5 MG tablet [id number] No TAKE ONE TABLET BY MOUTH ONCE DAILY  Patient taking differently: Take 5 mg by mouth daily.   End, [name], MD Taking Active Spouse/Significant Other  tamsulosin (FLOMAX) 0.4 MG CAPS capsule [id number] No Take 1 capsule (0.4 mg total) by mouth daily after supper. Rise Mu, PA-C Taking Active Spouse/Significant Other  torsemide (DEMADEX) 20 MG tablet [id number] No Take 2 tablets (40 mg total) by mouth daily. Darrick Grinder D, NP Taking Active   traMADol (ULTRAM) 50 MG tablet [id number] No Take 1-2 tablets (50-100 mg total) by mouth at bedtime as needed. [name], DO Taking Active Spouse/Significant Other  TRULICITY 1.5 BJ/6.2GB SOPN [id number] No Inject 1.5 mg into the skin once a week. [name], DO Taking Active Spouse/Significant Other           Med Note [facility], BRANDY L   Thu Jun 29, 2020  1:45 PM)    warfarin (COUMADIN) 3 MG tablet [id number] No TAKE 1 TO 1 & 1/2 TABLETS BY MOUTH AS DIRECTED BY THE COUMADIN CLINIC End, [name], MD Taking Active              SDOH:  (Social Determinants of Health)  assessments and interventions performed:    Jay  Review of patient past medical history, allergies, medications, health status, including review of consultants reports, laboratory and other test data, was performed as part of comprehensive evaluation and provision of chronic care management services.   Care Plan : PharmD - Medication Management/Assistance  Updates made by Rennis Petty, RPH-CPP since 05/11/2021 12:00 AM     Problem: Disease Progression      Long-Range Goal: Disease Progression Prevented or Minimized   Start Date: 04/14/2020  Expected End Date: 07/13/2020  Recent Progress: On track  Priority: High  Note:   Current Barriers:  Chronic Disease Management support, education, and care coordination needs related to type 2 diabetes, CKD, HFrEF s/p CardioMEMS, hyperlipidemia, atrial fibrillation, hypertension, coronary artery disease, CHF, hypothyroidism, and CML Financial Note patient has Partial Extra Help/LIS Subsidy Patient enrolled in [employer] health practitioner and [employer]) patient assistance program for 2022 calendar year Patient denied for patient assistance for Jaridance from [employer] for 2022 calendar year Patient previously enrolled in [place] for [place] copayment assistance, but eligibility expected to expire on 06/09/2021 as not in use (currently no copayment through Part D plan Limited vision  Pharmacist Clinical Goal(s):  Over the  next 90 days, patient will verbalize ability to afford treatment regimen through collaboration with PharmD and provider.   Interventions: 1:1 collaboration with [name], DO regarding development and update of comprehensive plan of care as evidenced by provider attestation and co-signature Inter-disciplinary care team collaboration (see longitudinal plan of care) Receive notification from [facility] that patient's refill of Jardiance might be overdue (or latest refill  not billed through grant). To keep his [place] active, must request and receive payment for a claim from [employer] for Jardiance by 06/09/2021 From review of dispensing history in chart, note Jardiance Rx last refilled at [facility] on 04/16/2021 Attempt to place call to [facility] today, but find that [facility] closed permanently yesterday. Call automatically transfers to [place] on [address] in [facility] advises all prescriptions from [facility] were automatically sent to this [facility] runs claim for [employer] Rx and finds his copayment is $0 through his current Medicare Part D plan From review of [employer], note through patient's current [employer] Value Plus Plan, patient's qualifying for "[employer]" based on Extra Help status qualify for the elimination of cost sharing for Part D drugs Outreach to patient today to follow up. Reach patient's spouse.  Let spouse know that [facility] has closed (prescriptions automatically transferred to [employer] on [address]; [place]) Let spouse know that patient's current cost for Vania Rea is $0 through his [employer] coverage and that patient will need to re-apply for [place] only if needed in the future Will follow up with patient/spouse regarding medication management as scheduled at upcoming appointment   Patient Goals/Self-Care Activities Over the next 90 days, patient will:  Check blood sugar, blood pressure and fluid status as directed by providers  Take medications, with assistance of wife, as directed Mrs. Rath manages patient's medications using weekly pillbox as adherence tool Attends scheduled medical appointments  Follow Up Plan: Telephone follow up appointment with care management team member scheduled  for: 05/21/2021 at 10:45 am       [name], PharmD, [place], [facility] [place] [phone]

## 2021-05-14 ENCOUNTER — Other Ambulatory Visit: Payer: Self-pay | Admitting: *Deleted

## 2021-05-14 DIAGNOSIS — M12812 Other specific arthropathies, not elsewhere classified, left shoulder: Secondary | ICD-10-CM | POA: Diagnosis not present

## 2021-05-14 DIAGNOSIS — M12811 Other specific arthropathies, not elsewhere classified, right shoulder: Secondary | ICD-10-CM | POA: Diagnosis not present

## 2021-05-14 LAB — CULTURE, BLOOD (ROUTINE X 2)

## 2021-05-15 ENCOUNTER — Other Ambulatory Visit: Payer: Self-pay

## 2021-05-15 DIAGNOSIS — R270 Ataxia, unspecified: Secondary | ICD-10-CM | POA: Diagnosis not present

## 2021-05-15 DIAGNOSIS — E11621 Type 2 diabetes mellitus with foot ulcer: Secondary | ICD-10-CM

## 2021-05-15 DIAGNOSIS — I25118 Atherosclerotic heart disease of native coronary artery with other forms of angina pectoris: Secondary | ICD-10-CM

## 2021-05-15 DIAGNOSIS — L97509 Non-pressure chronic ulcer of other part of unspecified foot with unspecified severity: Secondary | ICD-10-CM

## 2021-05-15 DIAGNOSIS — I5032 Chronic diastolic (congestive) heart failure: Secondary | ICD-10-CM | POA: Diagnosis not present

## 2021-05-15 DIAGNOSIS — Z794 Long term (current) use of insulin: Secondary | ICD-10-CM | POA: Diagnosis not present

## 2021-05-15 DIAGNOSIS — E119 Type 2 diabetes mellitus without complications: Secondary | ICD-10-CM | POA: Diagnosis not present

## 2021-05-15 DIAGNOSIS — E782 Mixed hyperlipidemia: Secondary | ICD-10-CM | POA: Diagnosis not present

## 2021-05-15 DIAGNOSIS — N1831 Chronic kidney disease, stage 3a: Secondary | ICD-10-CM | POA: Diagnosis not present

## 2021-05-15 DIAGNOSIS — R251 Tremor, unspecified: Secondary | ICD-10-CM | POA: Diagnosis not present

## 2021-05-15 DIAGNOSIS — R2689 Other abnormalities of gait and mobility: Secondary | ICD-10-CM | POA: Diagnosis not present

## 2021-05-15 DIAGNOSIS — G629 Polyneuropathy, unspecified: Secondary | ICD-10-CM | POA: Diagnosis not present

## 2021-05-15 DIAGNOSIS — I10 Essential (primary) hypertension: Secondary | ICD-10-CM

## 2021-05-15 DIAGNOSIS — J432 Centrilobular emphysema: Secondary | ICD-10-CM

## 2021-05-15 DIAGNOSIS — E1121 Type 2 diabetes mellitus with diabetic nephropathy: Secondary | ICD-10-CM | POA: Diagnosis not present

## 2021-05-15 MED ORDER — WARFARIN SODIUM 3 MG PO TABS: ORAL_TABLET | ORAL | 0 refills | Status: DC

## 2021-05-15 MED ORDER — WARFARIN SODIUM 3 MG PO TABS: ORAL_TABLET | ORAL | 3 refills | Status: DC

## 2021-05-15 MED ORDER — ROSUVASTATIN CALCIUM 5 MG PO TABS: 5.0000 mg | ORAL_TABLET | Freq: Every day | ORAL | 3 refills | Status: DC

## 2021-05-15 NOTE — Telephone Encounter (Signed)
Request for warfarin refill: LOV 02/14/21 C Burge NP Last INR 1.7 on 05/09/21 Refill approved

## 2021-05-15 NOTE — Addendum Note (Signed)
Addended by: Malen Gauze on: 05/15/2021 07:37 AM   Modules accepted: Orders

## 2021-05-16 ENCOUNTER — Other Ambulatory Visit: Payer: Self-pay

## 2021-05-16 ENCOUNTER — Ambulatory Visit (INDEPENDENT_AMBULATORY_CARE_PROVIDER_SITE_OTHER): Payer: Medicare HMO | Admitting: Nurse Practitioner

## 2021-05-16 ENCOUNTER — Encounter: Payer: Self-pay | Admitting: Nurse Practitioner

## 2021-05-16 VITALS — BP 110/80 | HR 89 | Ht 63.0 in | Wt 190.0 lb

## 2021-05-16 DIAGNOSIS — N184 Chronic kidney disease, stage 4 (severe): Secondary | ICD-10-CM

## 2021-05-16 DIAGNOSIS — I951 Orthostatic hypotension: Secondary | ICD-10-CM

## 2021-05-16 DIAGNOSIS — Z794 Long term (current) use of insulin: Secondary | ICD-10-CM | POA: Diagnosis not present

## 2021-05-16 DIAGNOSIS — I255 Ischemic cardiomyopathy: Secondary | ICD-10-CM | POA: Diagnosis not present

## 2021-05-16 DIAGNOSIS — E1121 Type 2 diabetes mellitus with diabetic nephropathy: Secondary | ICD-10-CM | POA: Diagnosis not present

## 2021-05-16 DIAGNOSIS — I5022 Chronic systolic (congestive) heart failure: Secondary | ICD-10-CM

## 2021-05-16 DIAGNOSIS — I2510 Atherosclerotic heart disease of native coronary artery without angina pectoris: Secondary | ICD-10-CM

## 2021-05-16 DIAGNOSIS — I480 Paroxysmal atrial fibrillation: Secondary | ICD-10-CM

## 2021-05-16 NOTE — Patient Instructions (Signed)
Medication Instructions:  ?Your physician recommends that you continue on your current medications as directed. Please refer to the Current Medication list given to you today. ? ?*If you need a refill on your cardiac medications before your next appointment, please call your pharmacy* ? ? ?Lab Work: ?Bmp today ?If you have labs (blood work) drawn today and your tests are completely normal, you will receive your results only by: ?MyChart Message (if you have MyChart) OR ?A paper copy in the mail ?If you have any lab test that is abnormal or we need to change your treatment, we will call you to review the results. ? ? ?Testing/Procedures: ?None ordered ? ? ?Follow-Up: ?At [facility], you and your health needs are our priority.  As part of our continuing mission to provide you with exceptional heart care, we have created designated Provider Care Teams.  These Care Teams include your primary Cardiologist (physician) and Advanced Practice Providers (APPs -  Physician Assistants and Nurse Practitioners) who all work together to provide you with the care you need, when you need it. ? ?We recommend signing up for the patient portal called "MyChart".  Sign up information is provided on this After Visit Summary.  MyChart is used to connect with patients for Virtual Visits (Telemedicine).  Patients are able to view lab/test results, encounter notes, upcoming appointments, etc.  Non-urgent messages can be sent to your provider as well.   ?To learn more about what you can do with MyChart, go to [email or web address].   ? ?Your next appointment:   ?4 month(s) ? ?The format for your next appointment:   ?In Person ? ?Provider:   ?You may see [name], MD or one of the following Advanced Practice Providers on your designated Care Team:   ?[name], NP ?[name], PA-C ?Cadence Kathlen Mody, PA-C :1}  ? ? ?Other Instructions ?N/A ? ?

## 2021-05-16 NOTE — Progress Notes (Signed)
Office Visit    Patient Name: Lucas Caldwell Date of Encounter: 05/16/2021  Primary Care Provider:  [name], DO Primary Cardiologist:  [name], MD  Chief Complaint    88 y/o ? with a history of CAD status post prior RCA, circumflex, and LAD stents, ischemic cardiomyopathy, chronic combined systolic and diastolic congestive heart failure, hypertension, hyperlipidemia, diabetes, paroxysmal atrial fibrillation (on warfarin), stage III-IV chronic kidney disease, CML, iron deficiency anemia, GI bleed due to duodenal ulcers (October 2019), BPH, hypothyroidism, and peripheral neuropathy with unsteady gait, who presents for follow-up of CAD and heart failure.  Past Medical History    Past Medical History:  Diagnosis Date   Arthritis    "probably in his legs" (08/15/2016)   Blind right eye    BPH (benign prostatic hyperplasia)    Breast asymmetry    Left breast is larger, present for several years.   CAD (coronary artery disease)    a. Cath in the late 90's - reportedly ok;  b. 2014 s/p stenting x 2 @ [facility]; c 08/19/16 Cath/PCI with DES -> RCA, plan to treat LM 70% medically. Seen by surgery and felt to be too high risk for CABG; d. 08/2018 Cath: LM 70 (iFR 0.86), LAD 20ost, 40p, 50/24m, D1 70, LCX patent stent, OM1 30, RCA patent stent, 2m ISR, 40d, RPDA 30. PCWP 8. CO/CI 4.0/2.1; e. 03/2019 PCI to LAD (2.5x15 Resolute Onyx DES).   Chronic combined systolic (congestive) and diastolic (congestive) heart failure ([place])    a. Previously reduced EF-->50% by echo in 2012;  b. 06/2015 Echo: EF 50-55%  c. 07/2016 Echo: EF 45-50%; d. 12/2017 Echo: EF 55%; e. 08/2018 Echo: EF 35-40%; f. 04/2019 Echo: EF 30-35%; g. s/p cardiomems; h. 10/2020 Echo: EF 30-35%, mod-sev glob HK, gr1 DD.   Chronic kidney disease (CKD), stage IV (severe) (HCC)    CML (chronic myelocytic leukemia) (HCC)    GERD (gastroesophageal reflux disease)    GIB (gastrointestinal bleeding)    a. 12/2017 3 unit PRBC GIB  in setting of coumadin-->Endo/colon multiple duodenal ulcers and a single bleeding ulcer in the proximal ascending colon status post hemostatic clipping x2.   Gout    Hyperlipemia    Hypertension    Hypothyroidism    Iron deficiency anemia    Ischemic cardiomyopathy    a. Previously reduced EF-->50% by echo in 2012;  b. 06/2015 Echo: EF 50-55%, Gr2 DD;  c. 07/2016 Echo: EF 45-50%; d. 12/2017 Echo: EF 55%, Gr1 DD, mild MR; e. 08/2018 Echo: EF 35-40%; f. 04/2019 Echo: EF 30-35%; g. 10/2020 Echo: EF 30-35%.   Migraine    "in the 1960s" (08/15/2016)   Obstructive sleep apnea    Orthostatic hypotension    a. 20222 -->carvedilol and hydralazine d/c'd.   PAF (paroxysmal atrial fibrillation) (HCC)    a. ? Dx 2014-->s/p DCCV;  b. CHA2DS2VASc = 6--> Coumadin.   Prostate cancer ([place])    RBBB    Type II diabetes mellitus [facility])    Past Surgical History:  Procedure Laterality Date   AMPUTATION TOE Right 02/23/2021   Procedure: AMPUTATION TOE;  Surgeon: Samara Deist, DPM;  Location: [facility] ORS;  Service: Podiatry;  Laterality: Right;   CATARACT EXTRACTION W/ INTRAOCULAR LENS  IMPLANT, BILATERAL Bilateral    COLONOSCOPY N/A 01/13/2018   Procedure: COLONOSCOPY;  Surgeon: Lin Landsman, MD;  Location: [facility] ENDOSCOPY;  Service: Gastroenterology;  Laterality: N/A;   COLONOSCOPY WITH PROPOFOL N/A 01/01/2018   Procedure: COLONOSCOPY  WITH PROPOFOL;  Surgeon: Lin Landsman, MD;  Location: [facility] ENDOSCOPY;  Service: Gastroenterology;  Laterality: N/A;   CORONARY ANGIOPLASTY WITH STENT PLACEMENT  09/2012   2 stents   CORONARY STENT INTERVENTION N/A 08/19/2016   Procedure: Coronary Stent Intervention;  Surgeon: [name], MD;  Location: [place] CV LAB;  Service: Cardiovascular;  Laterality: N/A;   CORONARY STENT INTERVENTION N/A 04/13/2019   Procedure: CORONARY STENT INTERVENTION;  Surgeon: [name], MD;  Location: [place] CV LAB;  Service: Cardiovascular;  Laterality: N/A;    ESOPHAGEAL MANOMETRY N/A 12/16/2017   Procedure: ESOPHAGEAL MANOMETRY (EM);  Surgeon: Lin Landsman, MD;  Location: [facility] ENDOSCOPY;  Service: Gastroenterology;  Laterality: N/A;   ESOPHAGOGASTRODUODENOSCOPY N/A 12/20/2016   Procedure: ESOPHAGOGASTRODUODENOSCOPY (EGD);  Surgeon: Lin Landsman, MD;  Location: [facility] ENDOSCOPY;  Service: Gastroenterology;  Laterality: N/A;   ESOPHAGOGASTRODUODENOSCOPY N/A 01/13/2018   Procedure: ESOPHAGOGASTRODUODENOSCOPY (EGD);  Surgeon: Lin Landsman, MD;  Location: [facility] ENDOSCOPY;  Service: Gastroenterology;  Laterality: N/A;   ESOPHAGOGASTRODUODENOSCOPY (EGD) WITH PROPOFOL N/A 11/03/2017   Procedure: ESOPHAGOGASTRODUODENOSCOPY (EGD) WITH PROPOFOL;  Surgeon: Lin Landsman, MD;  Location: [facility] ENDOSCOPY;  Service: Gastroenterology;  Laterality: N/A;   EYE SURGERY     HERNIA REPAIR     "navel"   LEFT HEART CATH AND CORONARY ANGIOGRAPHY N/A 08/14/2016   Procedure: Left Heart Cath and Coronary Angiography;  Surgeon: [name], MD;  Location: [place] CV LAB;  Service: Cardiovascular;  Laterality: N/A;   PRESSURE SENSOR/CARDIOMEMS N/A 09/23/2019   Procedure: PRESSURE SENSOR/CARDIOMEMS;  Surgeon: Jolaine Artist, MD;  Location: [place] CV LAB;  Service: Cardiovascular;  Laterality: N/A;   RIGHT HEART CATH N/A 09/23/2019   Procedure: RIGHT HEART CATH;  Surgeon: Jolaine Artist, MD;  Location: [place] CV LAB;  Service: Cardiovascular;  Laterality: N/A;   RIGHT/LEFT HEART CATH AND CORONARY ANGIOGRAPHY N/A 09/01/2018   Procedure: RIGHT/LEFT HEART CATH AND CORONARY ANGIOGRAPHY;  Surgeon: [name], MD;  Location: [place] CV LAB;  Service: Cardiovascular;  Laterality: N/A;   RIGHT/LEFT HEART CATH AND CORONARY ANGIOGRAPHY N/A 04/13/2019   Procedure: RIGHT/LEFT HEART CATH AND CORONARY ANGIOGRAPHY;  Surgeon: [name], MD;  Location: [place] CV LAB;  Service: Cardiovascular;  Laterality: N/A;   TOE AMPUTATION  Right    "big toe"    Allergies  Allergies  Allergen Reactions   Clopidogrel Anaphylaxis and Other (See Comments)    altered mental status;  (electrolytes "out of wack" and confusion per family; THEY DO NOT REMEMBER ANY OTHER REACTION)- MSB 10/10/15   Ciprofloxacin Itching   Beta Adrenergic Blockers     Nightmares   Mirtazapine Diarrhea    Bad dreams    Spironolactone Other (See Comments)    gynecomastia    History of Present Illness    88 year old ? with a history of CAD status post prior RCA, circumflex, and LAD stents, ischemic cardiomyopathy, chronic combined systolic and diastolic congestive heart failure, hypertension, hyperlipidemia, diabetes, paroxysmal atrial fibrillation (on warfarin), stage III-IV chronic kidney disease, CML, iron deficiency anemia, GI bleed due to duodenal ulcers (October 2019), BPH, hypothyroidism, and peripheral neuropathy with unsteady gait.  He previously underwent stenting of the right coronary artery in June 2018 was noted to have residual 70% left main disease.  He was evaluated by CT surgery at that time and felt not to be a suitable surgical candidate.  Due to ongoing symptoms, he underwent PCI of the LAD and January 2021.  EF has been variable  over the years with a high of 55% by echocardiogram in 2019 with subsequent decline in EF to 30-35% by echo in February 2021.  In July 2021, he underwent CardioMEMS placement and this has been followed by our advanced heart failure team in [place] since.  Beta-blocker therapy was previously discontinued secondary to nightmares (both metoprolol and carvedilol).  Echocardiogram in August 2022 showed persistent LV dysfunction with an EF of 30 to 35%.  Due to ongoing orthostasis, hydralazine was discontinued in August 2022, and losartan was cut in half from 25 to 12.5 mg daily, in late August.  Mr. Wenger was last seen in cardiology clinic in November, at which time he reported chronic, stable dyspnea on exertion  as well as intermittent orthostatic lightheadedness.  Despite chronic dyspnea, he noted improvement overall compared to 6 months prior.  At November visit, isosorbide was reduced to half a tablet daily (15 mg).  He had a remote cardiomems reading on January 3, showing mildly elevated PA diastolic pressure of 19 (goal 15).  He was advised to increase his torsemide to 40 mg daily.  He had follow-up labs on January 6 which showed slight elevation in creatinine to 1.59.  No changes were made to medications.  Since then, he seems to have done exceptionally well.  He tries remain active though is now retired.  On the higher dose of Lasix, his dyspnea seems to have improved and he denies chest pain, palpitations, PND, orthopnea, dizziness, syncope, edema, or early satiety.  He has been doing some work [employer] on a part-time basis but no longer has to lift them.  He has enjoyed this and tolerates it well.  Home Medications    Current Outpatient Medications  Medication Sig Dispense Refill   acetaminophen (TYLENOL) 500 MG tablet Take 1,000 mg by mouth every 6 (six) hours as needed for moderate pain or headache.      Alcohol Swabs (B-D SINGLE USE SWABS REGULAR) PADS Use to check blood sugar up to 2 times daily 200 each 3   allopurinol (ZYLOPRIM) 100 MG tablet Take 1 tablet (100 mg total) by mouth daily. 90 tablet 0   aspirin 81 MG chewable tablet Chew 81 mg by mouth daily.     Blood Glucose Monitoring Suppl (TRUE METRIX METER) DEVI Use to check blood sugar up to 2 times daily 1 each 0   bosutinib (BOSULIF) 100 MG tablet Take 100 mg by mouth at bedtime. Take with food.     Cholecalciferol 25 MCG (1000 UT) capsule Take 1,000 Units by mouth daily.     famotidine (PEPCID) 40 MG tablet Take 1 tablet (40 mg total) by mouth 2 (two) times daily with a meal. 180 tablet 3   gabapentin (NEURONTIN) 300 MG capsule Take 300 mg by mouth 3 (three) times daily.     Insulin Glargine ([name] KWIKPEN) 100 UNIT/ML  Inject 18 Units into the skin daily.     Insulin Pen Needle 31G X 5 MM MISC 31 g by Does not apply route as directed. 100 each 5   isosorbide mononitrate (IMDUR) 60 MG 24 hr tablet Take 0.5 tablets (30 mg total) by mouth daily. 90 tablet 0   JARDIANCE 10 MG TABS tablet Take 1 tablet (10 mg total) by mouth daily with breakfast. 90 tablet 1   levothyroxine (SYNTHROID) 25 MCG tablet TAKE ONE TABLET BY MOUTH EVERY MORNING BEFORE BREAKFAST 90 tablet 1   losartan (COZAAR) 25 MG tablet Take 12.5 mg by mouth daily.  Menthol-Zinc Oxide (GOLD BOND EX) Apply 1 application topically daily as needed (muscle pain).     Multiple Vitamin (MULTIVITAMIN WITH MINERALS) TABS tablet Take 1 tablet by mouth daily.     nitroGLYCERIN (NITROSTAT) 0.4 MG SL tablet Place 1 tablet (0.4 mg total) under the tongue every 5 (five) minutes x 3 doses as needed for chest pain. 25 tablet 1   Polyethyl Glycol-Propyl Glycol (SYSTANE OP) Place 1 drop into both eyes 3 (three) times daily as needed (dry/irritated eyes.).      polyethylene glycol (MIRALAX / GLYCOLAX) 17 g packet Take 17 g by mouth daily as needed for mild constipation.      ranolazine (RANEXA) 500 MG 12 hr tablet TAKE ONE TABLET BY MOUTH TWICE DAILY 180 tablet 0   rosuvastatin (CRESTOR) 5 MG tablet Take 1 tablet (5 mg total) by mouth daily. 90 tablet 3   tamsulosin (FLOMAX) 0.4 MG CAPS capsule Take 1 capsule (0.4 mg total) by mouth daily after supper. 30 capsule 11   torsemide (DEMADEX) 20 MG tablet Take 2 tablets (40 mg total) by mouth daily. 60 tablet 3   traMADol (ULTRAM) 50 MG tablet Take 1-2 tablets (50-100 mg total) by mouth at bedtime as needed. 30 tablet 2   TRULICITY 1.5 YI/5.0YD SOPN Inject 1.5 mg into the skin once a week. 6 mL 3   warfarin (COUMADIN) 3 MG tablet TAKE 1 TO 1 & 1/2 TABLETS BY MOUTH AS DIRECTED BY THE COUMADIN CLINIC Strength: 3 mg 135 tablet 0   No current facility-administered medications for this visit.     Review of Systems    Doing  well.  Dyspnea seems to have improved.  He denies chest pain, palpitations, PND, orthopnea, dizziness, syncope, edema, or early satiety.  All other systems reviewed and are otherwise negative except as noted above.    Physical Exam    VS:  BP 110/80 (BP Location: Left Arm, Patient Position: Sitting, Cuff Size: Normal)    Pulse 89    Ht 5\' 3"  (1.6 m)    Wt 190 lb (86.2 kg)    SpO2 97%    BMI 33.66 kg/m  , BMI Body mass index is 33.66 kg/m.     GEN: Well nourished, well developed, in no acute distress. HEENT: normal. Neck: Supple, no JVD, carotid bruits, or masses. Cardiac: RRR, no murmurs, rubs, or gallops. No clubbing, cyanosis, edema.  Radials/DP/PT 2+ and equal bilaterally.  Respiratory:  Respirations regular and unlabored, clear to auscultation bilaterally. GI: Soft, nontender, nondistended, BS + x 4. MS: no deformity or atrophy. Skin: warm and dry, no rash. Neuro:  Strength and sensation are intact. Psych: Normal affect.  Accessory Clinical Findings    ECG personally reviewed by me today -regular sinus rhythm, 89, left axis deviation, left anterior fascicular block, right bundle branch block- no acute changes.  Lab Results  Component Value Date   WBC 14.1 (H) 02/24/2021   HGB 11.9 (L) 02/24/2021   HCT 37.2 (L) 02/24/2021   MCV 84.0 02/24/2021   PLT 202 02/24/2021   Lab Results  Component Value Date   CREATININE 1.59 (H) 03/23/2021   BUN 28 (H) 03/23/2021   NA 137 03/23/2021   K 3.8 03/23/2021   CL 108 03/23/2021   CO2 22 03/23/2021   Lab Results  Component Value Date   ALT 10 02/22/2021   AST 18 02/22/2021   ALKPHOS 57 02/22/2021   BILITOT 1.1 02/22/2021   Lab Results  Component Value Date  CHOL 123 06/29/2020   HDL 29 (L) 06/29/2020   LDLCALC 65 06/29/2020   TRIG 170 (H) 06/29/2020   CHOLHDL 4.2 06/29/2020    Lab Results  Component Value Date   HGBA1C 7.3 (H) 02/21/2021    Assessment & Plan    1.  Chronic heart failure with reduced ejection  fraction/ischemic cardiomyopathy: EF 30 to 35% in August 2022 with moderate to severe global hypokinesis and grade 1 diastolic dysfunction.  Torsemide was changed to 40 mg daily in the setting of an elevated PA diastolic pressure (19) on his CardioMEMS in January.  Since then, he feels that his breathing has improved and he is euvolemic on examination today.  He remains on ARB therapy.  As previously noted, he is no longer on beta-blocker secondary to nightmares.  Dapagliflozin.  Guideline directed medical therapy historically limited by orthostatic lightheadedness, though he denies this today.  2.  Coronary artery disease: Status post prior circumflex followed by RCA and most recently LAD stenting.  He denies chest pain, and as above, dyspnea has improved.  He remains on aspirin, statin, and long-acting nitrate therapy.  3.  Orthostatic hypotension: Hydralazine previously discontinued, and losartan dose limited to 12.5 mg daily.  Denies any orthostatic lightheadedness at this time.  4.  Stage III-IV chronic kidney disease: Creatinine 1.59 on January 6.  We will follow-up today in the setting of adjustment of torsemide dose in January.  5.  Paroxysmal atrial fibrillation: Maintaining sinus rhythm and is anticoagulated with warfarin.  No beta-blocker in the setting of history of nightmares.  6.  Hyperlipidemia: LDL of 65 in April 2022 with normal LFTs in December.  Continue statin therapy.  7.  Type 2 diabetes mellitus: A1c 7.3 in December.  Followed closely by primary care.  8.  Peripheral neuropathy: This resulted in unsteady gait and he has been using a cane.  Overall stable.  Followed by neurology.  9.  Disposition: He has follow-up in heart failure clinic in 2 months.  We will arrange for follow-up here in 4 months.  Follow-up basic metabolic panel today.  [name], NP 05/16/2021, 5:55 PM

## 2021-05-17 ENCOUNTER — Telehealth: Payer: Self-pay

## 2021-05-17 LAB — BASIC METABOLIC PANEL
BUN/Creatinine Ratio: 18 (ref 10–24)
BUN: 23 mg/dL (ref 8–27)
CO2: 23 mmol/L (ref 20–29)
Calcium: 9.5 mg/dL (ref 8.6–10.2)
Chloride: 99 mmol/L (ref 96–106)
Creatinine, Ser: 1.27 mg/dL (ref 0.76–1.27)
Glucose: 133 mg/dL — ABNORMAL HIGH (ref 70–99)
Potassium: 3.9 mmol/L (ref 3.5–5.2)
Sodium: 138 mmol/L (ref 134–144)
eGFR: 55 mL/min/{1.73_m2} — ABNORMAL LOW (ref >59)

## 2021-05-17 NOTE — Telephone Encounter (Signed)
Attempted to reach out to pt via phone to review recent lab result ?Unable to make contact ?Mailbox full, cannot LM ?

## 2021-05-18 ENCOUNTER — Ambulatory Visit (INDEPENDENT_AMBULATORY_CARE_PROVIDER_SITE_OTHER): Payer: Medicare HMO | Admitting: Internal Medicine

## 2021-05-18 ENCOUNTER — Other Ambulatory Visit: Payer: Self-pay

## 2021-05-18 ENCOUNTER — Encounter: Payer: Self-pay | Admitting: Internal Medicine

## 2021-05-18 VITALS — BP 116/73 | HR 81 | Temp 97.3°F | Wt 193.0 lb

## 2021-05-18 DIAGNOSIS — R198 Other specified symptoms and signs involving the digestive system and abdomen: Secondary | ICD-10-CM | POA: Diagnosis not present

## 2021-05-18 DIAGNOSIS — R140 Abdominal distension (gaseous): Secondary | ICD-10-CM

## 2021-05-18 DIAGNOSIS — K921 Melena: Secondary | ICD-10-CM | POA: Diagnosis not present

## 2021-05-18 DIAGNOSIS — R110 Nausea: Secondary | ICD-10-CM | POA: Diagnosis not present

## 2021-05-18 DIAGNOSIS — R109 Unspecified abdominal pain: Secondary | ICD-10-CM | POA: Diagnosis not present

## 2021-05-18 DIAGNOSIS — T887XXA Unspecified adverse effect of drug or medicament, initial encounter: Secondary | ICD-10-CM

## 2021-05-18 NOTE — Progress Notes (Signed)
Subjective:    Patient ID: Lucas Caldwell, male    DOB: [birth year], 88 y.o.   MRN: [id number]  HPI  Patient presents the clinic today with complaint of nausea, bloating, abdominal cramping, intermittent constipation and diarrhea.  He reports this started 2 to 3 months ago.  He reports it seems to come and go.  He cannot tie it to any specific foods that he is eating.  He denies starting any new medications or change in his diet.  He denies decreased appetite, weight loss, vomiting, reflux or blood in his stool although he does report that his stool is black at times.  He has tried Famotidine with minimal relief of symptoms.  He has had duodenitis in the past and reports this feels completely different.  CT abdomen pelvis from 06/2016 revealed evidence of diverticulosis.  Review of Systems  Past Medical History:  Diagnosis Date   Arthritis    "probably in his legs" (08/15/2016)   Blind right eye    BPH (benign prostatic hyperplasia)    Breast asymmetry    Left breast is larger, present for several years.   CAD (coronary artery disease)    a. Cath in the late 90's - reportedly ok;  b. 2014 s/p stenting x 2 @ [facility]; c 08/19/16 Cath/PCI with DES -> RCA, plan to treat LM 70% medically. Seen by surgery and felt to be too high risk for CABG; d. 08/2018 Cath: LM 70 (iFR 0.86), LAD 20ost, 40p, 50/28m, D1 70, LCX patent stent, OM1 30, RCA patent stent, 83m ISR, 40d, RPDA 30. PCWP 8. CO/CI 4.0/2.1; e. 03/2019 PCI to LAD (2.5x15 Resolute Onyx DES).   Chronic combined systolic (congestive) and diastolic (congestive) heart failure ([facility])    a. Previously reduced EF-->50% by echo in 2012;  b. 06/2015 Echo: EF 50-55%  c. 07/2016 Echo: EF 45-50%; d. 12/2017 Echo: EF 55%; e. 08/2018 Echo: EF 35-40%; f. 04/2019 Echo: EF 30-35%; g. s/p cardiomems; h. 10/2020 Echo: EF 30-35%, mod-sev glob HK, gr1 DD.   Chronic kidney disease (CKD), stage IV (severe) (HCC)    CML (chronic myelocytic leukemia) (HCC)    GERD  (gastroesophageal reflux disease)    GIB (gastrointestinal bleeding)    a. 12/2017 3 unit PRBC GIB in setting of coumadin-->Endo/colon multiple duodenal ulcers and a single bleeding ulcer in the proximal ascending colon status post hemostatic clipping x2.   Gout    Hyperlipemia    Hypertension    Hypothyroidism    Iron deficiency anemia    Ischemic cardiomyopathy    a. Previously reduced EF-->50% by echo in 2012;  b. 06/2015 Echo: EF 50-55%, Gr2 DD;  c. 07/2016 Echo: EF 45-50%; d. 12/2017 Echo: EF 55%, Gr1 DD, mild MR; e. 08/2018 Echo: EF 35-40%; f. 04/2019 Echo: EF 30-35%; g. 10/2020 Echo: EF 30-35%.   Migraine    "in the 1960s" (08/15/2016)   Obstructive sleep apnea    Orthostatic hypotension    a. 20222 -->carvedilol and hydralazine d/c'd.   PAF (paroxysmal atrial fibrillation) (HCC)    a. ? Dx 2014-->s/p DCCV;  b. CHA2DS2VASc = 6--> Coumadin.   Prostate cancer (HCC)    RBBB    Type II diabetes mellitus (HCC)     Current Outpatient Medications  Medication Sig Dispense Refill   acetaminophen (TYLENOL) 500 MG tablet Take 1,000 mg by mouth every 6 (six) hours as needed for moderate pain or headache.      Alcohol Swabs (B-D SINGLE USE SWABS REGULAR)  PADS Use to check blood sugar up to 2 times daily 200 each 3   allopurinol (ZYLOPRIM) 100 MG tablet Take 1 tablet (100 mg total) by mouth daily. 90 tablet 0   aspirin 81 MG chewable tablet Chew 81 mg by mouth daily.     Blood Glucose Monitoring Suppl (TRUE METRIX METER) DEVI Use to check blood sugar up to 2 times daily 1 each 0   bosutinib (BOSULIF) 100 MG tablet Take 100 mg by mouth at bedtime. Take with food.     Cholecalciferol 25 MCG (1000 UT) capsule Take 1,000 Units by mouth daily.     famotidine (PEPCID) 40 MG tablet Take 1 tablet (40 mg total) by mouth 2 (two) times daily with a meal. 180 tablet 3   gabapentin (NEURONTIN) 300 MG capsule Take 300 mg by mouth 3 (three) times daily.     Insulin Glargine ([name] KWIKPEN) 100 UNIT/ML Inject  18 Units into the skin daily.     Insulin Pen Needle 31G X 5 MM MISC 31 g by Does not apply route as directed. 100 each 5   isosorbide mononitrate (IMDUR) 60 MG 24 hr tablet Take 0.5 tablets (30 mg total) by mouth daily. 90 tablet 0   JARDIANCE 10 MG TABS tablet Take 1 tablet (10 mg total) by mouth daily with breakfast. 90 tablet 1   levothyroxine (SYNTHROID) 25 MCG tablet TAKE ONE TABLET BY MOUTH EVERY MORNING BEFORE BREAKFAST 90 tablet 1   losartan (COZAAR) 25 MG tablet Take 12.5 mg by mouth daily.     Menthol-Zinc Oxide (GOLD BOND EX) Apply 1 application topically daily as needed (muscle pain).     Multiple Vitamin (MULTIVITAMIN WITH MINERALS) TABS tablet Take 1 tablet by mouth daily.     nitroGLYCERIN (NITROSTAT) 0.4 MG SL tablet Place 1 tablet (0.4 mg total) under the tongue every 5 (five) minutes x 3 doses as needed for chest pain. 25 tablet 1   Polyethyl Glycol-Propyl Glycol (SYSTANE OP) Place 1 drop into both eyes 3 (three) times daily as needed (dry/irritated eyes.).      polyethylene glycol (MIRALAX / GLYCOLAX) 17 g packet Take 17 g by mouth daily as needed for mild constipation.      ranolazine (RANEXA) 500 MG 12 hr tablet TAKE ONE TABLET BY MOUTH TWICE DAILY 180 tablet 0   rosuvastatin (CRESTOR) 5 MG tablet Take 1 tablet (5 mg total) by mouth daily. 90 tablet 3   tamsulosin (FLOMAX) 0.4 MG CAPS capsule Take 1 capsule (0.4 mg total) by mouth daily after supper. 30 capsule 11   torsemide (DEMADEX) 20 MG tablet Take 2 tablets (40 mg total) by mouth daily. 60 tablet 3   traMADol (ULTRAM) 50 MG tablet Take 1-2 tablets (50-100 mg total) by mouth at bedtime as needed. 30 tablet 2   TRULICITY 1.5 KP/5.4SF SOPN Inject 1.5 mg into the skin once a week. 6 mL 3   warfarin (COUMADIN) 3 MG tablet TAKE 1 TO 1 & 1/2 TABLETS BY MOUTH AS DIRECTED BY THE COUMADIN CLINIC Strength: 3 mg 135 tablet 0   No current facility-administered medications for this visit.    Allergies  Allergen Reactions    Clopidogrel Anaphylaxis and Other (See Comments)    altered mental status;  (electrolytes "out of wack" and confusion per family; THEY DO NOT REMEMBER ANY OTHER REACTION)- MSB 10/10/15   Ciprofloxacin Itching   Beta Adrenergic Blockers     Nightmares   Mirtazapine Diarrhea    Bad dreams  Spironolactone Other (See Comments)    gynecomastia    Family History  Problem Relation Age of Onset   Brain cancer Father    Diabetes Sister    Heart attack Sister     Social History   Socioeconomic History   Marital status: Married    Spouse name: Not on file   Number of children: Not on file   Years of education: Not on file   Highest education level: 7th grade  Occupational History   Occupation: Retired    Comment: Owned his own [employer].  Tobacco Use   Smoking status: Former    Packs/day: 1.75    Years: 20.00    Pack years: 35.00    Types: Cigarettes    Start date: 67    Quit date: 1975    Years since quitting: 48.2   Smokeless tobacco: Former  Scientific laboratory technician Use: Never used  Substance and Sexual Activity   Alcohol use: No    Comment: previously drank heavily - quit 1979.   Drug use: No   Sexual activity: Never  Other Topics Concern   Not on file  Social History Narrative   Working on transportation truck part time    Social Determinants of Radio broadcast assistant Strain: Low Risk    Difficulty of Paying Living Expenses: Not hard at all  Food Insecurity: No Food Insecurity   Worried About Charity fundraiser in the Last Year: Never true   Arboriculturist in the Last Year: Never true  Transportation Needs: No Transportation Needs   Lack of Transportation (Medical): No   Lack of Transportation (Non-Medical): No  Physical Activity: Inactive   Days of Exercise per Week: 0 days   Minutes of Exercise per Session: 0 min  Stress: No Stress Concern Present   Feeling of Stress : Not at all  Social Connections: Moderately Integrated   Frequency of  Communication with Friends and Family: More than three times a week   Frequency of Social Gatherings with Friends and Family: More than three times a week   Attends Religious Services: More than 4 times per year   Active Member of [employer] or Organizations: No   Attends Archivist Meetings: Never   Marital Status: Married  Human resources officer Violence: Not At Risk   Fear of Current or Ex-Partner: No   Emotionally Abused: No   Physically Abused: No   Sexually Abused: No     Constitutional: Denies fever, malaise, fatigue, headache or abrupt weight changes.  Respiratory: Denies difficulty breathing, shortness of breath, cough or sputum production.   Cardiovascular: Denies chest pain, chest tightness, palpitations or swelling in the hands or feet.  Gastrointestinal: Patient reports nausea, bloating, abdominal cramping, intermittent constipation and diarrhea.  Denies or blood in the stool.  GU: Denies urgency, frequency, pain with urination, burning sensation, blood in urine, odor or discharge.   No other specific complaints in a complete review of systems (except as listed in HPI above).     Objective:   Physical Exam  BP 116/73 (BP Location: Right Arm, Patient Position: Sitting, Cuff Size: Large)    Pulse 81    Temp (!) 97.3 F (36.3 C) (Temporal)    Wt 193 lb (87.5 kg)    SpO2 95%    BMI 34.19 kg/m   Wt Readings from Last 3 Encounters:  05/16/21 190 lb (86.2 kg)  04/13/21 197 lb (89.4 kg)  03/05/21 197 lb (  89.4 kg)    General: Appears his stated age, obese, in NAD. Cardiovascular: Normal rate and rhythm.  Pulmonary/Chest: Normal effort and positive vesicular breath sounds. No respiratory distress. No wheezes, rales or ronchi noted.  Abdomen: Soft and nontender. Normal bowel sounds. No distention or masses noted.  Musculoskeletal: Gait slow and steady with use of cane. Neurological: Alert and oriented.  BMET    Component Value Date/Time   NA 138 05/16/2021 1604   NA  134 (L) 05/18/2012 0440   K 3.9 05/16/2021 1604   K 4.2 05/18/2012 0440   CL 99 05/16/2021 1604   CL 103 05/18/2012 0440   CO2 23 05/16/2021 1604   CO2 25 05/18/2012 0440   GLUCOSE 133 (H) 05/16/2021 1604   GLUCOSE 114 (H) 03/23/2021 1431   GLUCOSE 220 (H) 05/18/2012 0440   BUN 23 05/16/2021 1604   BUN 38 (H) 05/18/2012 0440   CREATININE 1.27 05/16/2021 1604   CREATININE 1.72 (H) 04/13/2020 1117   CALCIUM 9.5 05/16/2021 1604   CALCIUM 8.4 (L) 05/18/2012 0440   GFRNONAA 42 (L) 03/23/2021 1431   GFRNONAA 36 (L) 04/13/2020 1117   GFRAA 41 (L) 04/13/2020 1117    Lipid Panel     Component Value Date/Time   CHOL 123 06/29/2020 1411   TRIG 170 (H) 06/29/2020 1411   HDL 29 (L) 06/29/2020 1411   CHOLHDL 4.2 06/29/2020 1411   CHOLHDL 3.9 08/28/2018 1107   VLDL 31 08/28/2018 1107   LDLCALC 65 06/29/2020 1411    CBC    Component Value Date/Time   WBC 14.1 (H) 02/24/2021 0433   RBC 4.43 02/24/2021 0433   HGB 11.9 (L) 02/24/2021 0433   HGB 13.2 06/29/2020 1411   HCT 37.2 (L) 02/24/2021 0433   HCT 39.4 06/29/2020 1411   PLT 202 02/24/2021 0433   PLT 182 06/29/2020 1411   MCV 84.0 02/24/2021 0433   MCV 84 06/29/2020 1411   MCV 92 05/18/2012 0440   MCH 26.9 02/24/2021 0433   MCHC 32.0 02/24/2021 0433   RDW 15.3 02/24/2021 0433   RDW 15.6 (H) 06/29/2020 1411   RDW 15.3 (H) 05/18/2012 0440   LYMPHSABS 1.2 02/21/2021 1127   LYMPHSABS 1.8 08/23/2019 1544   LYMPHSABS 2.4 03/16/2012 0932   MONOABS 1.0 02/21/2021 1127   MONOABS 1.6 (H) 03/16/2012 0932   EOSABS 0.1 02/21/2021 1127   EOSABS 0.2 08/23/2019 1544   EOSABS 0.1 03/16/2012 0932   BASOSABS 0.0 02/21/2021 1127   BASOSABS 0.1 08/23/2019 1544   BASOSABS 4 05/18/2012 0440   BASOSABS 0.0 03/16/2012 0932    Hgb A1C Lab Results  Component Value Date   HGBA1C 7.3 (H) 02/21/2021            Assessment & Plan:   Nausea, Bloating, Abdominal Cramping, Intermittent Constipation and Diarrhea, Black Stool:  He feels  like he is having viral symptoms however I do not feel like this is viral as the symptoms are recurring I wonder if this is related to his Trulicity, advised him to hold this for 3 weeks and see if symptoms improve Advised him to monitor his sugars during this time and if his sugars are consistently >200 please notify PCP Want to do a rectal exam for Hemoccult due to his report of black stool however he adamantly refused this today  Return precautions discussed Webb Silversmith, NP  This visit occurred during the SARS-CoV-2 public health emergency.  Safety protocols were in place, including screening questions prior to the  visit, additional usage of staff PPE, and extensive cleaning of exam room while observing appropriate contact time as indicated for disinfecting solutions.

## 2021-05-18 NOTE — Patient Instructions (Signed)
Nausea, Adult ?Nausea is the feeling of having an upset stomach or that you are about to vomit. Nausea on its own is not usually a serious concern, but it may be an early sign of a more serious medical problem. As nausea gets worse, it can lead to vomiting. If vomiting develops, or if you are not able to drink enough fluids, you are at risk of becoming dehydrated. ?Dehydration can make you tired and thirsty, cause you to have a dry mouth, and decrease how often you urinate. Older adults and people with other diseases or a weak disease-fighting system (immune system) are at higher risk for dehydration. The main goals of treating your nausea are: ?To relieve your nausea. ?To limit repeated nausea episodes. ?To prevent vomiting and dehydration. ?Follow these instructions at home: ?Watch your symptoms for any changes. Tell your health care provider about them. ?Eating and drinking ?  ?Take an oral rehydration solution (ORS). This is a drink that is sold at pharmacies and retail stores. ?Drink clear fluids slowly and in small amounts as you are able. Clear fluids include water, ice chips, low-calorie sports drinks, and fruit juice that has water added (diluted fruit juice). ?Eat bland, easy-to-digest foods in small amounts as you are able. These foods include bananas, applesauce, rice, lean meats, toast, and crackers. ?Avoid drinking fluids that contain a lot of sugar or caffeine, such as energy drinks, sports drinks, and soda. ?Avoid alcohol. ?Avoid spicy or fatty foods. ?General instructions ?Take over-the-counter and prescription medicines only as told by your health care provider. ?Rest at home while you recover. ?Drink enough fluid to keep your urine pale yellow. ?Breathe slowly and deeply when you feel nauseous. ?Avoid smelling things that have strong odors. ?Wash your hands often using soap and water for at least 20 seconds. If soap and water are not available, use hand sanitizer. ?Make sure that everyone in your  household washes their hands well and often. ?Keep all follow-up visits. This is important. ?Contact a health care provider if: ?Your nausea gets worse. ?Your nausea does not go away after two days. ?You vomit multiple times. ?You cannot drink fluids without vomiting. ?You have any of the following: ?New symptoms. ?A fever. ?A headache. ?Muscle cramps. ?A rash. ?Pain while urinating. ?You feel light-headed or dizzy. ?Get help right away if: ?You have pain in your chest, neck, arm, or jaw. ?You feel extremely weak or you faint. ?You have vomit that is bright red or looks like coffee grounds. ?You have bloody or black stools (feces) or stools that look like tar. ?You have a severe headache, a stiff neck, or both. ?You have severe pain, cramping, or bloating in your abdomen. ?You have difficulty breathing or are breathing very quickly. ?Your heart is beating very quickly. ?Your skin feels cold and clammy. ?You feel confused. ?You have signs of dehydration, such as: ?Dark urine, very little urine, or no urine. ?Cracked lips. ?Dry mouth. ?Sunken eyes. ?Sleepiness. ?Weakness. ?These symptoms may be an emergency. Get help right away. Call 911. ?Do not wait to see if the symptoms will go away. ?Do not drive yourself to the hospital. ?Summary ?Nausea is the feeling that you have an upset stomach or that you are about to vomit. Nausea on its own is not usually a serious concern, but it may be an early sign of a more serious medical problem. ?If vomiting develops, or if you are not able to drink enough fluids, you are at risk of becoming dehydrated. ?Follow  recommendations for eating and drinking and take over-the-counter and prescription medicines only as told by your health care provider. ?Contact a health care provider right away if your symptoms worsen or you have new symptoms. ?Keep all follow-up visits. This is important. ?This information is not intended to replace advice given to you by your health care provider. Make  sure you discuss any questions you have with your health care provider. ?Document Revised: 09/08/2020 Document Reviewed: 09/08/2020 ?Elsevier Patient Education ? [place]. ? ?

## 2021-05-21 ENCOUNTER — Telehealth: Payer: Medicare HMO

## 2021-05-21 NOTE — Telephone Encounter (Signed)
Patient calling back for results, please call [phone] ?

## 2021-05-21 NOTE — Telephone Encounter (Signed)
Patients wife made aware of lab results with verbalized understanding. ?

## 2021-05-21 NOTE — Telephone Encounter (Signed)
Theora Gianotti, NP  ?05/17/2021 11:07 AM EST   ?  ?Kidney function and electrolytes look good.  ? ?

## 2021-05-23 ENCOUNTER — Other Ambulatory Visit: Payer: Self-pay

## 2021-05-23 ENCOUNTER — Ambulatory Visit (INDEPENDENT_AMBULATORY_CARE_PROVIDER_SITE_OTHER): Payer: Medicare HMO | Admitting: Family Medicine

## 2021-05-23 ENCOUNTER — Encounter: Payer: Self-pay | Admitting: Family Medicine

## 2021-05-23 VITALS — BP 148/81 | HR 85 | Ht 63.0 in | Wt 191.8 lb

## 2021-05-23 DIAGNOSIS — R197 Diarrhea, unspecified: Secondary | ICD-10-CM

## 2021-05-23 NOTE — Progress Notes (Signed)
? ?Subjective:  ? ? Patient ID: Lucas Caldwell, male    DOB: January 01, 1934, 88 y.o.   MRN: [id number] ? ?Lucas Caldwell is a 88 y.o. male presenting on 05/23/2021 for Diarrhea ? ? ?HPI ? ?Diarrhea ?GERD ?Last seen here at [facility] by Webb Silversmith, FNP on 05/18/21 for same issue. He was advised may be viral syndrome can run its course and also try hold off Trulicity thought GI intolerance side effect. ? ?Recently worsening with loose watery stool still having persistent problem. Tried OTC ys, loose stool, took OTC Imodium temporary relief only. ?Admits drinking more green tea and also fried/greasy food that seemed to trigger ?Admits GERD reflux symptoms, taking Omeprazole 38m x 2 for 474mOTC , however upon further review has med interaction ?No abdominal pain fever chills  ? ? ? ? ?[facility] 04/13/2021 10/02/2020 07/28/2020  ?Decreased Interest 0 0 0  ?Down, Depressed, Hopeless 0 0 0  ?PHQ - 2 Score 0 0 0  ?Altered sleeping 2 - 2  ?Tired, decreased energy 0 - 0  ?Change in appetite 0 - 0  ?Feeling bad or failure about yourself  0 - 0  ?Trouble concentrating 0 - 0  ?Moving slowly or fidgety/restless 0 - 0  ?Suicidal thoughts 0 - 0  ?PHQ-9 Score 2 - 2  ?Difficult doing work/chores Not difficult at all - -  ?Some recent data might be hidden  ? ? ?Social History  ? ?Tobacco Use  ? Smoking status: Former  ?  Packs/day: 1.75  ?  Years: 20.00  ?  Pack years: 35.00  ?  Types: Cigarettes  ?  Start date: 197?  Quit date: 1927?  Years since quitting: 48.2  ? Smokeless tobacco: Former  ?Vaping Use  ? Vaping Use: Never used  ?Substance Use Topics  ? Alcohol use: No  ?  Comment: previously drank heavily - quit 1979.  ? Drug use: No  ? ? ?Review of Systems ?Per HPI unless specifically indicated above ? ?   ?Objective:  ?  ?BP (!) 148/81   Pulse 85   Ht '5\' 3"'  (1.6 m)   Wt 191 lb 12.8 oz (87 kg)   SpO2 100%   BMI 33.98 kg/m?   ?Wt Readings from Last 3 Encounters:  ?05/23/21 191 lb 12.8 oz (87 kg)  ?05/18/21 193 lb  (87.5 kg)  ?05/16/21 190 lb (86.2 kg)  ?  ?Physical Exam ?Vitals and nursing note reviewed.  ?Constitutional:   ?   General: [name] not in acute distress. ?   Appearance: Normal appearance. [name] well-developed. [name] not diaphoretic.  ?   Comments: Well-appearing, comfortable, cooperative  ?HENT:  ?   Head: Normocephalic and atraumatic.  ?Eyes:  ?   General:     ?   Right eye: No discharge.     ?   Left eye: No discharge.  ?   Conjunctiva/sclera: Conjunctivae normal.  ?Cardiovascular:  ?   Rate and Rhythm: Normal rate.  ?Pulmonary:  ?   Effort: Pulmonary effort is normal.  ?Skin: ?   General: Skin is warm and dry.  ?   Findings: No erythema or rash.  ?Neurological:  ?   Mental Status: [name] alert and oriented to person, place, and time.  ?Psychiatric:     ?   Mood and Affect: Mood normal.     ?   Behavior: Behavior normal.     ?  Thought Content: Thought content normal.  ?   Comments: Well groomed, good eye contact, normal speech and thoughts  ? ?Results for orders placed or performed in visit on 05/16/21  ?Basic metabolic panel  ?Result Value Ref Range  ? Glucose 133 (H) 70 - 99 mg/dL  ? BUN 23 8 - 27 mg/dL  ? Creatinine, Ser 1.27 0.76 - 1.27 mg/dL  ? eGFR 55 (L) >59 mL/min/1.73  ? BUN/Creatinine Ratio 18 10 - 24  ? Sodium 138 134 - 144 mmol/L  ? Potassium 3.9 3.5 - 5.2 mmol/L  ? Chloride 99 96 - 106 mmol/L  ? CO2 23 20 - 29 mmol/L  ? Calcium 9.5 8.6 - 10.2 mg/dL  ? ?*Note: Due to a large number of results and/or encounters for the requested time period, some results have not been displayed. A complete set of results can be found in Results Review.  ? ?   ?Assessment & Plan:  ? ?Problem List Items Addressed This Visit   ?None ?Visit Diagnoses   ? ? Diarrhea, unspecified type    -  Primary  ? Relevant Orders  ? Gastrointestinal Pathogen Panel PCR  ? C. difficile GDH and Toxin A/B  ? ?  ?  ?Persistent diarrhea, loose watery stool >4 weeks now ?Possible viral syndrome but now seems lasting too long ? ?Today check  Stool testing C Diff and GI Pathogen PCR panel ? ?Remain off Trulicity 1.5 - since 05/19/27 - goal for 2-3 weeks to see if it was causing side effect with diarrhea ? ?If unresolved - Refer to GI if negative and problem persists ? ? ?No orders of the defined types were placed in this encounter. ? ? ? ? ?Follow up plan: ?Return if symptoms worsen or fail to improve. ? ? ?[name], DO ?[facility] ?[facility] ?05/23/2021, 11:23 AM ?

## 2021-05-23 NOTE — Patient Instructions (Addendum)
Thank you for coming to the office today. ? ?Try to avoid hard candy mints sugar free gum, limit artificial sweeteners and creamers. ? ?Stool testing for infection. ? ?Remain off Trulicity injection for next 2-3 weeks, SKIP 3 doses to see if the diarrhea improves. If the diarrhea goes away and gets better, then REMAIN OFF Trulicity we will need to find an alternative treatment. ? ?We can refer to GI if we cannot find answer. ? ?[facility]) ?[place], [place] [address] ?Phone: [phone] ? ? ?Please schedule a Follow-up Appointment to: Return if symptoms worsen or fail to improve. ? ?If you have any other questions or concerns, please feel free to call the office or send a message through [facility]. You may also schedule an earlier appointment if necessary. ? ?Additionally, you may be receiving a survey about your experience at our office within a few days to 1 week by e-mail or mail. We value your feedback. ? ?[name], DO ?[facility] ?

## 2021-05-29 DIAGNOSIS — G4733 Obstructive sleep apnea (adult) (pediatric): Secondary | ICD-10-CM | POA: Diagnosis not present

## 2021-05-31 ENCOUNTER — Telehealth: Payer: Self-pay | Admitting: Family Medicine

## 2021-05-31 NOTE — Telephone Encounter (Signed)
N/A unable to leave a message for patient to call back and schedule the Medicare Annual Wellness Visit (AWV) virtually or by telephone. ? ?Last AWV 11/24/18 ? ?Please schedule at anytime with [facility]. ? ?40 minute appointment ? ?Any questions, please call me at [phone]  ?

## 2021-06-06 ENCOUNTER — Ambulatory Visit (INDEPENDENT_AMBULATORY_CARE_PROVIDER_SITE_OTHER): Payer: Medicare HMO

## 2021-06-06 ENCOUNTER — Other Ambulatory Visit: Payer: Self-pay

## 2021-06-06 DIAGNOSIS — I480 Paroxysmal atrial fibrillation: Secondary | ICD-10-CM

## 2021-06-06 DIAGNOSIS — Z7901 Long term (current) use of anticoagulants: Secondary | ICD-10-CM | POA: Diagnosis not present

## 2021-06-06 LAB — POCT INR: INR: 2.9 (ref 2.0–3.0)

## 2021-06-06 NOTE — Patient Instructions (Signed)
-   continue dosage of warfarin of 1.5 TABLETS EVERY DAY EXCEPT 1 TABLETS ON MONDAYS & FRIDAYS. ?- recheck INR in 6 weeks ?

## 2021-06-11 ENCOUNTER — Ambulatory Visit (INDEPENDENT_AMBULATORY_CARE_PROVIDER_SITE_OTHER): Payer: Medicare HMO | Admitting: Pharmacist

## 2021-06-11 ENCOUNTER — Other Ambulatory Visit: Payer: Self-pay | Admitting: Family Medicine

## 2021-06-11 DIAGNOSIS — E1121 Type 2 diabetes mellitus with diabetic nephropathy: Secondary | ICD-10-CM

## 2021-06-11 NOTE — Chronic Care Management (AMB) (Signed)
? ?Chronic Care Management ?CCM Pharmacy Note ? ?06/11/2021 ?Name:  Lucas Caldwell MRN:  [id number] DOB:  03-28-1933 ? ? ?Subjective: ?Lucas Caldwell is an 88 y.o. year old male who is a primary patient of [name], DO.  The CCM team was consulted for assistance with disease management and care coordination needs.   ? ?Engaged with patient and spouse by telephone for follow up visit for pharmacy case management and/or care coordination services.  ? ?Objective: ? ?Medications Reviewed Today   ? ? Reviewed by [name], DO (Physician) on 05/23/21 at 1122  Med List Status: <None>  ? ?Medication Order Taking? Sig Documenting Provider Last Dose Status Informant  ?acetaminophen (TYLENOL) 500 MG tablet [id number] Yes Take 1,000 mg by mouth every 6 (six) hours as needed for moderate pain or headache.  [provider] Taking Active Spouse/Significant Other  ?Alcohol Swabs (B-D SINGLE USE SWABS REGULAR) PADS [id number] Yes Use to check blood sugar up to 2 times daily [name], DO Taking Active Spouse/Significant Other  ?allopurinol (ZYLOPRIM) 100 MG tablet [id number] Yes Take 1 tablet (100 mg total) by mouth daily. Bensimhon, Shaune Pascal, MD Taking Active   ?aspirin 81 MG chewable tablet [id number] Yes Chew 81 mg by mouth daily. [provider] Taking Active Spouse/Significant Other  ?Blood Glucose Monitoring Suppl (TRUE METRIX METER) DEVI [id number] Yes Use to check blood sugar up to 2 times daily [name], DO Taking Active Spouse/Significant Other  ?bosutinib (BOSULIF) 100 MG tablet [id number] Yes Take 100 mg by mouth at bedtime. Take with food. [provider] Taking Active Spouse/Significant Other  ?         ?Med Note Jeanie Cooks Sep 15, 2019  8:38 AM)    ?Cholecalciferol 25 MCG (1000 UT) capsule [id number] Yes Take 1,000 Units by mouth daily. [provider] Taking Active Spouse/Significant Other  ?famotidine  (PEPCID) 40 MG tablet [id number] Yes Take 1 tablet (40 mg total) by mouth 2 (two) times daily with a meal. Parks Ranger, [name], DO Taking Active   ?gabapentin (NEURONTIN) 300 MG capsule [id number] Yes Take 300 mg by mouth 3 (three) times daily. [provider] Taking Active   ?Insulin Glargine ([name] KWIKPEN) 100 UNIT/ML [id number] Yes Inject 18 Units into the skin daily. [provider] Taking Active   ?Insulin Pen Needle 31G X 5 MM MISC [id number] Yes 31 g by Does not apply route as directed. [name], NP Taking Active Spouse/Significant Other  ?isosorbide mononitrate (IMDUR) 60 MG 24 hr tablet [id number] Yes Take 0.5 tablets (30 mg total) by mouth daily. Theora Gianotti, NP Taking Active Spouse/Significant Other  ?JARDIANCE 10 MG TABS tablet [id number] Yes Take 1 tablet (10 mg total) by mouth daily with breakfast. [name], DO Taking Active Spouse/Significant Other  ?levothyroxine (SYNTHROID) 25 MCG tablet [id number] Yes TAKE ONE TABLET BY MOUTH EVERY MORNING BEFORE BREAKFAST Karamalegos, [name], DO Taking Active Spouse/Significant Other  ?losartan (COZAAR) 25 MG tablet [id number] Yes Take 12.5 mg by mouth daily. [provider] Taking Active Spouse/Significant Other  ?Menthol-Zinc Oxide (GOLD BOND EX) [id number] Yes Apply 1 application topically daily as needed (muscle pain). [provider] Taking Active Spouse/Significant Other  ?Multiple Vitamin (MULTIVITAMIN WITH MINERALS) TABS tablet [id number] Yes Take 1 tablet by mouth daily. [provider] Taking Active Spouse/Significant Other  ?nitroGLYCERIN (NITROSTAT) 0.4 MG SL tablet [id number] Yes Place 1 tablet (0.4 mg total) under the tongue every 5 (five)  minutes x 3 doses as needed for chest pain. Theora Gianotti, NP Taking Active Spouse/Significant Other  ?Polyethyl Glycol-Propyl Glycol (SYSTANE OP) [id number] Yes Place 1 drop into both eyes 3 (three) times daily as  needed (dry/irritated eyes.).  [provider] Taking Active Spouse/Significant Other  ?polyethylene glycol (MIRALAX / GLYCOLAX) 17 g packet [id number] Yes Take 17 g by mouth daily as needed for mild constipation.  [provider] Taking Active Spouse/Significant Other  ?ranolazine (RANEXA) 500 MG 12 hr tablet [id number] Yes TAKE ONE TABLET BY MOUTH TWICE DAILY End, [name], MD Taking Active   ?rosuvastatin (CRESTOR) 5 MG tablet [id number] Yes Take 1 tablet (5 mg total) by mouth daily. End, [name], MD Taking Active   ?tamsulosin (FLOMAX) 0.4 MG CAPS capsule [id number] Yes Take 1 capsule (0.4 mg total) by mouth daily after supper. Rise Mu, PA-C Taking Active Spouse/Significant Other  ?torsemide (DEMADEX) 20 MG tablet [id number] Yes Take 2 tablets (40 mg total) by mouth daily. Darrick Grinder D, NP Taking Active   ?traMADol (ULTRAM) 50 MG tablet [id number] Yes Take 1-2 tablets (50-100 mg total) by mouth at bedtime as needed. [name], DO Taking Active Spouse/Significant Other  ?TRULICITY 1.5 CV/8.9FY SOPN [id number] Yes Inject 1.5 mg into the skin once a week. [name], DO Taking Active Spouse/Significant Other  ?         ?Med Note [facility], BRANDY L   Thu Jun 29, 2020  1:45 PM)    ?warfarin (COUMADIN) 3 MG tablet [id number] Yes TAKE 1 TO 1 & 1/2 TABLETS BY MOUTH AS DIRECTED BY THE COUMADIN CLINIC Strength: 3 mg End, [name], MD Taking Active   ? ?  ?  ? ?  ? ? ?Pertinent Labs:  ?Lab Results  ?Component Value Date  ? HGBA1C 7.3 (H) 02/21/2021  ? ?Lab Results  ?Component Value Date  ? CHOL 123 06/29/2020  ? HDL 29 (L) 06/29/2020  ? [place] 65 06/29/2020  ? TRIG 170 (H) 06/29/2020  ? CHOLHDL 4.2 06/29/2020  ? ?Lab Results  ?Component Value Date  ? CREATININE 1.27 05/16/2021  ? BUN 23 05/16/2021  ? NA 138 05/16/2021  ? K 3.9 05/16/2021  ? CL 99 05/16/2021  ? CO2 23 05/16/2021  ? ? ?SDOH:  (Social Determinants of Health) assessments and interventions  performed:  ? ? ?CCM Care Plan ? ?Review of patient past medical history, allergies, medications, health status, including review of consultants reports, laboratory and other test data, was performed as part of comprehensive evaluation and provision of chronic care management services.  ? ?Care Plan : PharmD - Medication Management/Assistance  ?Updates made by Rennis Petty, RPH-CPP since 06/11/2021 12:00 AM  ?  ? ?Problem: Disease Progression   ?  ? ?Long-Range Goal: Disease Progression Prevented or Minimized   ?Start Date: 04/14/2020  ?Expected End Date: 07/13/2020  ?Recent Progress: On track  ?Priority: High  ?Note:   ?Current Barriers:  ?Chronic Disease Management support, education, and care coordination needs related to type 2 diabetes, CKD, HFrEF s/p CardioMEMS, hyperlipidemia, atrial fibrillation, hypertension, coronary artery disease, CHF, hypothyroidism, and CML ?Financial ?Note patient has Partial Extra Help/LIS Subsidy ?Patient enrolled in [place] [employer] health practitioner and Trulicity) patient assistance program for 2023 calendar year ?Patient denied for patient assistance for Jaridance from [facility] for 2022 calendar year ?Patient previously enrolled in [place] for Jardiance copayment assistance, but eligibility expected to expire on 06/09/2021 as not in use (currently no copayment through Part D  plan ?Limited vision ? ?Pharmacist Clinical Goal(s):  ?Over the next 90 days, patient will verbalize ability to afford treatment regimen through collaboration with PharmD and provider.  ? ?Interventions: ?1:1 collaboration with [name], DO regarding development and update of comprehensive plan of care as evidenced by provider attestation and co-signature ?Inter-disciplinary care team collaboration (see longitudinal plan of care) ?Received message from PCP ?From review of chart, note patient seen for Office Visit with PCP on 3/8 for follow up regarding diarrhea. Provider  advised: ?Check Stool testing C Diff and GI Pathogen PCR panel ?Try to avoid hard candy mints sugar free gum, limit artificial sweeteners and creamers ?Remain off Trulicity injection for next 2-3 weeks, SKIP 3 dose

## 2021-06-12 NOTE — Patient Instructions (Signed)
Visit Information ? ?Thank you for taking time to visit with me today. Please don't hesitate to contact me if I can be of assistance to you before our next scheduled telephone appointment. ? ?Following are the goals we discussed today:  ? Goals Addressed   ? ?  ?  ?  ?  ? This Visit's Progress  ?  Pharmacy Goals     ?  Please check your blood sugar, keep a log of the results and bring this with you to your medical appointments. ? ?Please check your home blood pressure, keep a log of the results and bring this with you to your medical appointments. ? ?Our goal bad cholesterol, or LDL, is less than 70 . This is why it is important to continue taking your rosuvastatin. ? ?Feel free to call me with any questions or concerns. I look forward to our next call! ? ? ? ?[name], PharmD, BCACP ?Clinical Pharmacist ?[facility] ?[place] ?[phone] ?  ? ?  ? ? ? ?Our next appointment is by telephone on 4/10 at 1:45 pm ? ?Please call the care guide team at [phone] if you need to cancel or reschedule your appointment.  ? ? ?The patient verbalized understanding of instructions, educational materials, and care plan provided today and declined offer to receive copy of patient instructions, educational materials, and care plan.  ? ?

## 2021-06-15 ENCOUNTER — Other Ambulatory Visit: Payer: Self-pay | Admitting: Physician Assistant

## 2021-06-15 ENCOUNTER — Ambulatory Visit: Payer: Self-pay | Admitting: Pharmacist

## 2021-06-15 DIAGNOSIS — E1121 Type 2 diabetes mellitus with diabetic nephropathy: Secondary | ICD-10-CM

## 2021-06-15 DIAGNOSIS — Z794 Long term (current) use of insulin: Secondary | ICD-10-CM

## 2021-06-15 MED ORDER — BLOOD GLUCOSE TEST STRIPS 333 VI STRP: 1.0000 | ORAL_STRIP | Freq: Two times a day (BID) | 0 refills | Status: DC

## 2021-06-15 MED ORDER — INSULIN PEN NEEDLE 31G X 5 MM MISC: 31.0000 g | 0 refills | Status: DC

## 2021-06-15 MED ORDER — TRUE METRIX METER DEVI: 0 refills | Status: DC

## 2021-06-15 NOTE — Chronic Care Management (AMB) (Signed)
? ?Chronic Care Management ?CCM Pharmacy Note ? ?06/15/2021 ?Name:  Lucas Caldwell MRN:  [id number] DOB:  1933/07/27 ? ? ?Subjective: ?Lucas Caldwell is an 88 y.o. year old male who is a primary patient of [name], DO.  The CCM team was consulted for assistance with disease management and care coordination needs.   ? ?Receive a voicemail message from patient's spouse requesting a call back. ? ?Engaged with patient's spouse by telephone for follow up visit for pharmacy case management and/or care coordination services.  ? ?Objective: ? ?Medications Reviewed Today   ? ? Reviewed by Rennis Petty, RPH-CPP (Pharmacist) on 06/15/21 at 1303  Med List Status: <None>  ? ?Medication Order Taking? Sig Documenting Provider Last Dose Status Informant  ?acetaminophen (TYLENOL) 500 MG tablet [id number]  Take 1,000 mg by mouth every 6 (six) hours as needed for moderate pain or headache.  [provider]  Active Spouse/Significant Other  ?Alcohol Swabs (B-D SINGLE USE SWABS REGULAR) PADS [id number]  Use to check blood sugar up to 2 times daily [name], DO  Active Spouse/Significant Other  ?allopurinol (ZYLOPRIM) 100 MG tablet [id number]  Take 1 tablet (100 mg total) by mouth daily. Bensimhon, Shaune Pascal, MD  Active   ?aspirin 81 MG chewable tablet [id number]  Chew 81 mg by mouth daily. [provider]  Active Spouse/Significant Other  ?Blood Glucose Monitoring Suppl (TRUE METRIX METER) DEVI [id number]  Use to check blood sugar up to 2 times daily Mecum, Erin E, PA-C  Active   ?bosutinib (BOSULIF) 100 MG tablet [id number]  Take 100 mg by mouth at bedtime. Take with food. [provider]  Active Spouse/Significant Other  ?         ?Med Note Jeanie Cooks Sep 15, 2019  8:38 AM)    ?Cholecalciferol 25 MCG (1000 UT) capsule [id number]  Take 1,000 Units by mouth daily. [provider]  Active Spouse/Significant Other  ?famotidine (PEPCID) 40 MG tablet  [id number]  Take 1 tablet (40 mg total) by mouth 2 (two) times daily with a meal. Karamalegos, [name], DO  Active   ?gabapentin (NEURONTIN) 300 MG capsule [id number]  Take 300 mg by mouth 3 (three) times daily. [provider]  Active   ?Glucose Blood (BLOOD GLUCOSE TEST STRIPS 333) STRP [id number]  1 strip by In Vitro route in the morning and at bedtime. Mecum, Dani Gobble, PA-C  Active   ?Insulin Glargine ([name] KWIKPEN) 100 UNIT/ML [id number] Yes Inject 20 Units into the skin daily. [provider] Taking Active   ?Insulin Pen Needle 31G X 5 MM MISC [id number]  31 g by Does not apply route as directed. Mecum, Erin E, PA-C  Active   ?isosorbide mononitrate (IMDUR) 60 MG 24 hr tablet [id number]  Take 0.5 tablets (30 mg total) by mouth daily. Theora Gianotti, NP  Active Spouse/Significant Other  ?JARDIANCE 10 MG TABS tablet [id number] Yes Take 1 tablet (10 mg total) by mouth daily with breakfast. [name], DO Taking Active Spouse/Significant Other  ?levothyroxine (SYNTHROID) 25 MCG tablet [id number]  TAKE ONE TABLET BY MOUTH EVERY MORNING BEFORE BREAKFAST Karamalegos, [name], DO  Active Spouse/Significant Other  ?losartan (COZAAR) 25 MG tablet [id number]  Take 12.5 mg by mouth daily. [provider]  Active Spouse/Significant Other  ?Menthol-Zinc Oxide (GOLD BOND EX) [id number]  Apply 1 application topically daily as needed (muscle pain). [provider]  Active Spouse/Significant Other  ?Multiple Vitamin (MULTIVITAMIN WITH  MINERALS) TABS tablet [id number]  Take 1 tablet by mouth daily. [provider]  Active Spouse/Significant Other  ?nitroGLYCERIN (NITROSTAT) 0.4 MG SL tablet [id number]  Place 1 tablet (0.4 mg total) under the tongue every 5 (five) minutes x 3 doses as needed for chest pain. Theora Gianotti, NP  Active Spouse/Significant Other  ?Polyethyl Glycol-Propyl Glycol (SYSTANE OP) [id number]  Place 1 drop into both eyes 3 (three)  times daily as needed (dry/irritated eyes.).  [provider]  Active Spouse/Significant Other  ?polyethylene glycol (MIRALAX / GLYCOLAX) 17 g packet [id number]  Take 17 g by mouth daily as needed for mild constipation.  [provider]  Active Spouse/Significant Other  ?ranolazine (RANEXA) 500 MG 12 hr tablet [id number]  TAKE ONE TABLET BY MOUTH TWICE DAILY End, [name], MD  Active   ?rosuvastatin (CRESTOR) 5 MG tablet [id number]  Take 1 tablet (5 mg total) by mouth daily. End, [name], MD  Active   ?tamsulosin (FLOMAX) 0.4 MG CAPS capsule [id number]  Take 1 capsule (0.4 mg total) by mouth daily after supper. Rise Mu, PA-C  Active Spouse/Significant Other  ?torsemide (DEMADEX) 20 MG tablet [id number]  Take 2 tablets (40 mg total) by mouth daily. Darrick Grinder D, NP  Active   ?traMADol (ULTRAM) 50 MG tablet [id number]  Take 1-2 tablets (50-100 mg total) by mouth at bedtime as needed. [name], DO  Active Spouse/Significant Other  ?warfarin (COUMADIN) 3 MG tablet [id number]  TAKE 1 TO 1 & 1/2 TABLETS BY MOUTH AS DIRECTED BY THE COUMADIN CLINIC Strength: 3 mg End, [name], MD  Active   ? ?  ?  ? ?  ? ? ?Pertinent Labs:  ?Lab Results  ?Component Value Date  ? HGBA1C 7.3 (H) 02/21/2021  ? ?Lab Results  ?Component Value Date  ? CHOL 123 06/29/2020  ? HDL 29 (L) 06/29/2020  ? [place] 65 06/29/2020  ? TRIG 170 (H) 06/29/2020  ? CHOLHDL 4.2 06/29/2020  ? ?Lab Results  ?Component Value Date  ? CREATININE 1.27 05/16/2021  ? BUN 23 05/16/2021  ? NA 138 05/16/2021  ? K 3.9 05/16/2021  ? CL 99 05/16/2021  ? CO2 23 05/16/2021  ? ? ?SDOH:  (Social Determinants of Health) assessments and interventions performed:  ? ? ?CCM Care Plan ? ?Review of patient past medical history, allergies, medications, health status, including review of consultants reports, laboratory and other test data, was performed as part of comprehensive evaluation and provision of chronic care management services.  ? ?Care  Plan : PharmD - Medication Management/Assistance  ?Updates made by Rennis Petty, RPH-CPP since 06/15/2021 12:00 AM  ?  ? ?Problem: Disease Progression   ?  ? ?Long-Range Goal: Disease Progression Prevented or Minimized   ?Start Date: 04/14/2020  ?Expected End Date: 07/13/2020  ?Recent Progress: On track  ?Priority: High  ?Note:   ?Current Barriers:  ?Chronic Disease Management support, education, and care coordination needs related to type 2 diabetes, CKD, HFrEF s/p CardioMEMS, hyperlipidemia, atrial fibrillation, hypertension, coronary artery disease, CHF, hypothyroidism, and CML ?Financial ?Note patient has Partial Extra Help/LIS Subsidy ?Patient enrolled in [place] patient assistance program for 2023 calendar year ?Patient denied for patient assistance for Jaridance from [facility] for 2022 calendar year ?Patient previously enrolled in [place] for Jardiance copayment assistance, but eligibility expected to expire on 06/09/2021 as not in use (currently no copayment through Part D plan ?Limited vision ? ?Pharmacist Clinical Goal(s):  ?Over the next 90 days, patient will  verbalize ability to afford treatment regimen through collaboration with PharmD and provider.  ? ?Interventions: ?1:1 collaboration with [name], DO regarding development and update of comprehensive plan of care as evidenced by provider attestation and co-signature ?Inter-disciplinary care team collaboration (see longitudinal plan of care) ?Receive phone call from patient's spouse requesting a call back. Return call to spouse ? ?Type 2 Diabetes/Medication Assistance: ?Today spouse reports she received another shipment of Trulicity on 1/32 from [facility]/[place] assistance program, although patient is now OFF of Trulicity ?Note CM Pharmacist called [facility]/[place] assistance program on 3/27 to request program hold shipment of Trulicity to patient  ?Spouse states she will call [facility] today to  again ask pharmacy to hold future shipment of Trulicity ?Controlled; current treatment: ?[name] 20 units daily ?Jardiance 10 mg once daily in morning ?Patient OFF of Trulicity ?Previous therapies tried:

## 2021-06-15 NOTE — Patient Instructions (Signed)
Visit Information ? ?Thank you for taking time to visit with me today. Please don't hesitate to contact me if I can be of assistance to you before our next scheduled telephone appointment. ? ?Following are the goals we discussed today:  ? Goals Addressed   ? ?  ?  ?  ?  ? This Visit's Progress  ?  Pharmacy Goals     ?  Please check your blood sugar, keep a log of the results and bring this with you to your medical appointments. ? ?Please check your home blood pressure, keep a log of the results and bring this with you to your medical appointments. ? ?Our goal bad cholesterol, or LDL, is less than 70 . This is why it is important to continue taking your rosuvastatin. ? ?Feel free to call me with any questions or concerns. I look forward to our next call! ? ? ?[name], PharmD, BCACP ?Clinical Pharmacist ?[facility] ?[place] ?[phone] ?  ? ?  ? ? ? ?Our next appointment is by telephone on 4/10 at 1:45 pm ? ?Please call the care guide team at [phone] if you need to cancel or reschedule your appointment.  ? ? ?The patient verbalized understanding of instructions, educational materials, and care plan provided today and declined offer to receive copy of patient instructions, educational materials, and care plan.  ? ?

## 2021-06-15 NOTE — Progress Notes (Signed)
Requested refill for testing strips, meter and needles placed  ?

## 2021-06-18 ENCOUNTER — Telehealth: Payer: Self-pay

## 2021-06-18 DIAGNOSIS — Z794 Long term (current) use of insulin: Secondary | ICD-10-CM

## 2021-06-18 MED ORDER — INSULIN PEN NEEDLE 31G X 5 MM MISC: 3 refills | Status: DC

## 2021-06-18 NOTE — Telephone Encounter (Signed)
Copied from [place] [id number]. Topic: General - Other ?>> Jun 15, 2021 12:14 PM Valere Dross wrote: ?Reason for CRM: Pharmacy called in stating the pen needles that were sent over did not have the instructions on them, and requested that information be sent back over, please advise. ?

## 2021-06-18 NOTE — Telephone Encounter (Signed)
I have updated the previous order ? ?Lucas Putnam, DO ?[facility] ?[facility] ?06/18/2021, 1:05 PM ? ?

## 2021-06-19 ENCOUNTER — Telehealth: Payer: Self-pay | Admitting: Family Medicine

## 2021-06-19 NOTE — Telephone Encounter (Signed)
Lucas Caldwell, could you assist with confirming exact brand / version of test strips he is requesting? I see a few previous versions listed on chart. I am unclear based on this last phone message. It looks like supplies were recently ordered by [name] who was covering for me in office but I am not sure if those rx would be accepted. ? ?Thanks! ? ?[name], DO ?[facility] ?[facility] ?06/19/2021, 5:17 PM ? ?

## 2021-06-19 NOTE — Telephone Encounter (Signed)
Pt's wife called in states patient is out of strips and is requesting for Test strips called [facility]iero for pt's machine to be sent to  ?[facility] [place], [facility] Phone:  [phone]  ?Fax:  [phone]  ?  ?This is a brand the pt hasn't had before ?

## 2021-06-20 ENCOUNTER — Ambulatory Visit (INDEPENDENT_AMBULATORY_CARE_PROVIDER_SITE_OTHER): Payer: Medicare HMO | Admitting: Pharmacist

## 2021-06-20 ENCOUNTER — Telehealth: Payer: Self-pay

## 2021-06-20 ENCOUNTER — Other Ambulatory Visit: Payer: Self-pay | Admitting: Family Medicine

## 2021-06-20 DIAGNOSIS — Z794 Long term (current) use of insulin: Secondary | ICD-10-CM

## 2021-06-20 NOTE — Telephone Encounter (Signed)
Copied from [place] [id number]. Topic: General - Other ?>> Jun 20, 2021 10:31 AM Yvette Rack wrote: ?Reason for CRM: Wells Guiles, pharmacist with [facility] stated pt Rx is not covered by pt insurance so she would like to request permission to change Rx for glucose meter and supplies to what is covered by pt insurance. Cb# [phone] ?

## 2021-06-20 NOTE — Chronic Care Management (AMB) (Signed)
? ?Chronic Care Management ?CCM Pharmacy Note ? ?06/20/2021 ?Name:  Lucas Caldwell MRN:  [id number] DOB:  [birth year] ? ? ?Subjective: ?Lucas Caldwell is an 88 y.o. year old male who is a primary patient of [name], DO.  The CCM team was consulted for assistance with disease management and care coordination needs.   ? ?Engaged with patient and spouse by telephone for follow up visit for pharmacy case management and/or care coordination services.  ? ?Objective: ? ?Medications Reviewed Today   ? ? Reviewed by Rennis Petty, RPH-CPP (Pharmacist) on 06/15/21 at 1303  Med List Status: <None>  ? ?Medication Order Taking? Sig Documenting Provider Last Dose Status Informant  ?acetaminophen (TYLENOL) 500 MG tablet [id number]  Take 1,000 mg by mouth every 6 (six) hours as needed for moderate pain or headache.  [provider]  Active Spouse/Significant Other  ?Alcohol Swabs (B-D SINGLE USE SWABS REGULAR) PADS [id number]  Use to check blood sugar up to 2 times daily [name], DO  Active Spouse/Significant Other  ?allopurinol (ZYLOPRIM) 100 MG tablet [id number]  Take 1 tablet (100 mg total) by mouth daily. Bensimhon, Shaune Pascal, MD  Active   ?aspirin 81 MG chewable tablet [id number]  Chew 81 mg by mouth daily. [provider]  Active Spouse/Significant Other  ?Blood Glucose Monitoring Suppl (TRUE METRIX METER) DEVI [id number]  Use to check blood sugar up to 2 times daily Mecum, Erin E, PA-C  Active   ?bosutinib (BOSULIF) 100 MG tablet [id number]  Take 100 mg by mouth at bedtime. Take with food. [provider]  Active Spouse/Significant Other  ?         ?Med Note Jeanie Cooks Sep 15, 2019  8:38 AM)    ?Cholecalciferol 25 MCG (1000 UT) capsule [id number]  Take 1,000 Units by mouth daily. [provider]  Active Spouse/Significant Other  ?famotidine (PEPCID) 40 MG tablet [id number]  Take 1 tablet (40 mg total) by mouth 2 (two) times daily with a meal.  Karamalegos, [name], DO  Active   ?gabapentin (NEURONTIN) 300 MG capsule [id number]  Take 300 mg by mouth 3 (three) times daily. [provider]  Active   ?Glucose Blood (BLOOD GLUCOSE TEST STRIPS 333) STRP [id number]  1 strip by In Vitro route in the morning and at bedtime. Mecum, Dani Gobble, PA-C  Active   ?Insulin Glargine ([name] KWIKPEN) 100 UNIT/ML [id number] Yes Inject 20 Units into the skin daily. [provider] Taking Active   ?Insulin Pen Needle 31G X 5 MM MISC [id number]  31 g by Does not apply route as directed. Mecum, Erin E, PA-C  Active   ?isosorbide mononitrate (IMDUR) 60 MG 24 hr tablet [id number]  Take 0.5 tablets (30 mg total) by mouth daily. Theora Gianotti, NP  Active Spouse/Significant Other  ?JARDIANCE 10 MG TABS tablet [id number] Yes Take 1 tablet (10 mg total) by mouth daily with breakfast. [name], DO Taking Active Spouse/Significant Other  ?levothyroxine (SYNTHROID) 25 MCG tablet [id number]  TAKE ONE TABLET BY MOUTH EVERY MORNING BEFORE BREAKFAST Karamalegos, [name], DO  Active Spouse/Significant Other  ?losartan (COZAAR) 25 MG tablet [id number]  Take 12.5 mg by mouth daily. [provider]  Active Spouse/Significant Other  ?Menthol-Zinc Oxide (GOLD BOND EX) [id number]  Apply 1 application topically daily as needed (muscle pain). [provider]  Active Spouse/Significant Other  ?Multiple Vitamin (MULTIVITAMIN WITH MINERALS) TABS tablet [id number]  Take 1 tablet by mouth daily.  [provider]  Active Spouse/Significant Other  ?nitroGLYCERIN (NITROSTAT) 0.4 MG SL tablet [id number]  Place 1 tablet (0.4 mg total) under the tongue every 5 (five) minutes x 3 doses as needed for chest pain. Theora Gianotti, NP  Active Spouse/Significant Other  ?Polyethyl Glycol-Propyl Glycol (SYSTANE OP) [id number]  Place 1 drop into both eyes 3 (three) times daily as needed (dry/irritated eyes.).  [provider]  Active  Spouse/Significant Other  ?polyethylene glycol (MIRALAX / GLYCOLAX) 17 g packet [id number]  Take 17 g by mouth daily as needed for mild constipation.  [provider]  Active Spouse/Significant Other  ?ranolazine (RANEXA) 500 MG 12 hr tablet [id number]  TAKE ONE TABLET BY MOUTH TWICE DAILY End, [name], MD  Active   ?rosuvastatin (CRESTOR) 5 MG tablet [id number]  Take 1 tablet (5 mg total) by mouth daily. End, [name], MD  Active   ?tamsulosin (FLOMAX) 0.4 MG CAPS capsule [id number]  Take 1 capsule (0.4 mg total) by mouth daily after supper. Rise Mu, PA-C  Active Spouse/Significant Other  ?torsemide (DEMADEX) 20 MG tablet [id number]  Take 2 tablets (40 mg total) by mouth daily. Darrick Grinder D, NP  Active   ?traMADol (ULTRAM) 50 MG tablet [id number]  Take 1-2 tablets (50-100 mg total) by mouth at bedtime as needed. [name], DO  Active Spouse/Significant Other  ?warfarin (COUMADIN) 3 MG tablet [id number]  TAKE 1 TO 1 & 1/2 TABLETS BY MOUTH AS DIRECTED BY THE COUMADIN CLINIC Strength: 3 mg End, [name], MD  Active   ? ?  ?  ? ?  ? ? ?Pertinent Labs:  ?Lab Results  ?Component Value Date  ? HGBA1C 7.3 (H) 02/21/2021  ? ?Lab Results  ?Component Value Date  ? CHOL 123 06/29/2020  ? HDL 29 (L) 06/29/2020  ? [place] 65 06/29/2020  ? TRIG 170 (H) 06/29/2020  ? CHOLHDL 4.2 06/29/2020  ? ?Lab Results  ?Component Value Date  ? CREATININE 1.27 05/16/2021  ? BUN 23 05/16/2021  ? NA 138 05/16/2021  ? K 3.9 05/16/2021  ? CL 99 05/16/2021  ? CO2 23 05/16/2021  ? ? ?SDOH:  (Social Determinants of Health) assessments and interventions performed:  ? ? ?CCM Care Plan ? ?Review of patient past medical history, allergies, medications, health status, including review of consultants reports, laboratory and other test data, was performed as part of comprehensive evaluation and provision of chronic care management services.  ? ?Care Plan : PharmD - Medication Management/Assistance  ?Updates made by Rennis Petty, RPH-CPP since 06/20/2021 12:00 AM  ?  ? ?Problem: Disease Progression   ?  ? ?Long-Range Goal: Disease Progression Prevented or Minimized   ?Start Date: 04/14/2020  ?Expected End Date: 07/13/2020  ?Recent Progress: On track  ?Priority: High  ?Note:   ?Current Barriers:  ?Chronic Disease Management support, education, and care coordination needs related to type 2 diabetes, CKD, HFrEF s/p CardioMEMS, hyperlipidemia, atrial fibrillation, hypertension, coronary artery disease, CHF, hypothyroidism, and CML ?Financial ?Note patient has Partial Extra Help/LIS Subsidy ?Patient enrolled in [place] patient assistance program for 2023 calendar year ?Patient denied for patient assistance for Jaridance from [facility] for 2022 calendar year ?Patient previously enrolled in [place] for Jardiance copayment assistance, but eligibility expected to expire on 06/09/2021 as not in use (currently no copayment through Part D plan ?Limited vision ? ?Pharmacist Clinical Goal(s):  ?Over the next 90 days, patient will verbalize ability to afford treatment regimen through collaboration with PharmD and  provider.  ? ?Interventions: ?1:1 collaboration with [name], DO regarding development and update of comprehensive plan of care as evidenced by provider attestation and co-signature ?Inter-disciplinary care team collaboration (see longitudinal plan of care) ?Receive message from PCP requesting outreach to patient/spouse for assistance with diabetes testing supplies ? ?Type 2 Diabetes: ?Current treatment: ?[name] 20 units daily ?Jardiance 10 mg once daily in morning ?Previous therapies tried: Trulicity (diarrhea) ?Today spouse reports patient's recent fasting blood sugars readings, but note some confusion with reporting of readings due to multiple papers ?4/5: 213 ?4/4: 213 ?4/3: 170 ?4/2: 190 ?4/1: 150 ?3/31: 119 ?Counsel on importance of having regular well-balanced meals, while  controlling carbohydrate portion sizes ?Have counsel on signs of hypoglycemia and how to manage low blood sugar ?Exercise: reports patient walks with walker/cane ~10 minutes most days of the week ?Discuss import

## 2021-06-20 NOTE — Patient Instructions (Signed)
Visit Information ? ?Thank you for taking time to visit with me today. Please don't hesitate to contact me if I can be of assistance to you before our next scheduled telephone appointment. ? ?Following are the goals we discussed today:  ? Goals Addressed   ? ?  ?  ?  ?  ? This Visit's Progress  ?  Pharmacy Goals     ?  Please check your blood sugar, keep a log of the results and bring this with you to your medical appointments. ? ?Please check your home blood pressure, keep a log of the results and bring this with you to your medical appointments. ? ?Our goal bad cholesterol, or LDL, is less than 70 . This is why it is important to continue taking your rosuvastatin. ? ?Feel free to call me with any questions or concerns. I look forward to our next call! ? ? ? ?[name], PharmD, BCACP ?Clinical Pharmacist ?[facility] ?[place] ?[phone] ?  ? ?  ? ? ? ?Our next appointment is by telephone on 06/27/2021 9:15 AM ? ?Please call the care guide team at [phone] if you need to cancel or reschedule your appointment.  ? ? ?The patient verbalized understanding of instructions, educational materials, and care plan provided today and declined offer to receive copy of patient instructions, educational materials, and care plan.  ? ?

## 2021-06-21 ENCOUNTER — Telehealth: Payer: Self-pay | Admitting: Internal Medicine

## 2021-06-21 ENCOUNTER — Ambulatory Visit: Payer: Self-pay

## 2021-06-21 ENCOUNTER — Telehealth: Payer: Medicare HMO

## 2021-06-21 DIAGNOSIS — N1831 Chronic kidney disease, stage 3a: Secondary | ICD-10-CM

## 2021-06-21 DIAGNOSIS — I25118 Atherosclerotic heart disease of native coronary artery with other forms of angina pectoris: Secondary | ICD-10-CM

## 2021-06-21 DIAGNOSIS — I5032 Chronic diastolic (congestive) heart failure: Secondary | ICD-10-CM

## 2021-06-21 DIAGNOSIS — E11621 Type 2 diabetes mellitus with foot ulcer: Secondary | ICD-10-CM

## 2021-06-21 DIAGNOSIS — J432 Centrilobular emphysema: Secondary | ICD-10-CM

## 2021-06-21 DIAGNOSIS — E1121 Type 2 diabetes mellitus with diabetic nephropathy: Secondary | ICD-10-CM

## 2021-06-21 DIAGNOSIS — R197 Diarrhea, unspecified: Secondary | ICD-10-CM

## 2021-06-21 DIAGNOSIS — E782 Mixed hyperlipidemia: Secondary | ICD-10-CM

## 2021-06-21 DIAGNOSIS — G8929 Other chronic pain: Secondary | ICD-10-CM

## 2021-06-21 DIAGNOSIS — I10 Essential (primary) hypertension: Secondary | ICD-10-CM

## 2021-06-21 DIAGNOSIS — K219 Gastro-esophageal reflux disease without esophagitis: Secondary | ICD-10-CM

## 2021-06-21 MED ORDER — LOSARTAN POTASSIUM 25 MG PO TABS: 12.5000 mg | ORAL_TABLET | Freq: Every day | ORAL | 0 refills | Status: DC

## 2021-06-21 MED ORDER — TORSEMIDE 20 MG PO TABS: 40.0000 mg | ORAL_TABLET | Freq: Every day | ORAL | 0 refills | Status: DC

## 2021-06-21 NOTE — Telephone Encounter (Signed)
Requested Prescriptions  ? ?Signed Prescriptions Disp Refills  ? losartan (COZAAR) 25 MG tablet 45 tablet 0  ?  Sig: Take 0.5 tablets (12.5 mg total) by mouth daily.  ?  Authorizing Provider: END, CHRISTOPHER  ?  Ordering User: Eugenio Hoes, Mihika Surrette C  ? torsemide (DEMADEX) 20 MG tablet 180 tablet 0  ?  Sig: Take 2 tablets (40 mg total) by mouth daily.  ?  Authorizing Provider: END, CHRISTOPHER  ?  Ordering User: Othelia Pulling C  ? ? ?

## 2021-06-21 NOTE — Chronic Care Management (AMB) (Signed)
?Chronic Care Management  ? ?CCM RN Visit Note ? ?06/21/2021 ?Name: Lucas Caldwell MRN: [id number] DOB: September 22, 1933 ? ?Subjective: ?Lucas Caldwell is a 88 y.o. year old male who is a primary care patient of [name], DO. The care management team was consulted for assistance with disease management and care coordination needs.   ? ?Engaged with patient by telephone for follow up visit in response to provider referral for case management and/or care coordination services.  ? ?Consent to Services:  ?The patient was given information about Chronic Care Management services, agreed to services, and gave verbal consent prior to initiation of services.  Please see initial visit note for detailed documentation.  ? ?Patient agreed to services and verbal consent obtained.  ? ?Assessment: Review of patient past medical history, allergies, medications, health status, including review of consultants reports, laboratory and other test data, was performed as part of comprehensive evaluation and provision of chronic care management services.  ? ?SDOH (Social Determinants of Health) assessments and interventions performed:   ? ?CCM Care Plan ? ?Allergies  ?Allergen Reactions  ? Clopidogrel Anaphylaxis and Other (See Comments)  ?  altered mental status; ? (electrolytes "out of wack" and confusion per family; THEY DO NOT [place])- MSB 10/10/15  ? Ciprofloxacin Itching  ? Beta Adrenergic Blockers   ?  Nightmares  ? Mirtazapine Diarrhea  ?  Bad dreams ?  ? Spironolactone Other (See Comments)  ?  gynecomastia  ? ? ?Outpatient Encounter Medications as of 06/21/2021  ?Medication Sig  ? acetaminophen (TYLENOL) 500 MG tablet Take 1,000 mg by mouth every 6 (six) hours as needed for moderate pain or headache.   ? Alcohol Swabs (B-D SINGLE USE SWABS REGULAR) PADS Use to check blood sugar up to 2 times daily  ? allopurinol (ZYLOPRIM) 100 MG tablet Take 1 tablet (100 mg total) by mouth daily.  ? aspirin 81 MG  chewable tablet Chew 81 mg by mouth daily.  ? bosutinib (BOSULIF) 100 MG tablet Take 100 mg by mouth at bedtime. Take with food.  ? Cholecalciferol 25 MCG (1000 UT) capsule Take 1,000 Units by mouth daily.  ? famotidine (PEPCID) 40 MG tablet Take 1 tablet (40 mg total) by mouth 2 (two) times daily with a meal.  ? gabapentin (NEURONTIN) 300 MG capsule Take 300 mg by mouth 3 (three) times daily.  ? Insulin Glargine ([name] KWIKPEN) 100 UNIT/ML Inject 20 Units into the skin daily.  ? Insulin Pen Needle 31G X 5 MM MISC Use to inject insulin daily.  ? isosorbide mononitrate (IMDUR) 60 MG 24 hr tablet Take 0.5 tablets (30 mg total) by mouth daily.  ? JARDIANCE 10 MG TABS tablet Take 1 tablet (10 mg total) by mouth daily with breakfast.  ? levothyroxine (SYNTHROID) 25 MCG tablet TAKE ONE TABLET BY MOUTH EVERY MORNING BEFORE BREAKFAST  ? losartan (COZAAR) 25 MG tablet Take 12.5 mg by mouth daily.  ? Menthol-Zinc Oxide (GOLD BOND EX) Apply 1 application topically daily as needed (muscle pain).  ? Multiple Vitamin (MULTIVITAMIN WITH MINERALS) TABS tablet Take 1 tablet by mouth daily.  ? nitroGLYCERIN (NITROSTAT) 0.4 MG SL tablet Place 1 tablet (0.4 mg total) under the tongue every 5 (five) minutes x 3 doses as needed for chest pain.  ? ONETOUCH VERIO test strip Check blood sugar up to 2 x daily  ? Polyethyl Glycol-Propyl Glycol (SYSTANE OP) Place 1 drop into both eyes 3 (three) times daily as needed (dry/irritated eyes.).   ?  polyethylene glycol (MIRALAX / GLYCOLAX) 17 g packet Take 17 g by mouth daily as needed for mild constipation.   ? ranolazine (RANEXA) 500 MG 12 hr tablet TAKE ONE TABLET BY MOUTH TWICE DAILY  ? rosuvastatin (CRESTOR) 5 MG tablet Take 1 tablet (5 mg total) by mouth daily.  ? tamsulosin (FLOMAX) 0.4 MG CAPS capsule Take 1 capsule (0.4 mg total) by mouth daily after supper.  ? torsemide (DEMADEX) 20 MG tablet Take 2 tablets (40 mg total) by mouth daily.  ? traMADol (ULTRAM) 50 MG tablet Take 1-2 tablets  (50-100 mg total) by mouth at bedtime as needed.  ? warfarin (COUMADIN) 3 MG tablet TAKE 1 TO 1 & 1/2 TABLETS BY MOUTH AS DIRECTED BY THE COUMADIN CLINIC Strength: 3 mg  ? ?No facility-administered encounter medications on file as of 06/21/2021.  ? ? ?Patient Active Problem List  ? Diagnosis Date Noted  ? Amputated great toe, right ([place]) 03/05/2021  ? Acute hematogenous osteomyelitis ([place])   ? Cellulitis of foot   ? Diabetic foot infection ([place])   ? Diabetic foot ulcer ([place]) 02/21/2021  ? Diabetic polyneuropathy associated with type 2 diabetes mellitus ([place]) 11/23/2020  ? Orthostatic hypotension 10/16/2020  ? Obstructive sleep apnea 06/29/2020  ? Urge incontinence of urine 12/22/2019  ? Trifascicular block 10/20/2019  ? Wound infection 10/08/2019  ? Bifascicular block 05/13/2019  ? Coronary stent patent 04/13/2019  ? PSVT (paroxysmal supraventricular tachycardia) ([place]) 03/19/2019  ? 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 ([place]) 08/24/2018  ? CKD (chronic kidney disease), stage IV ([place]) 06/04/2018  ? Osteoarthritis of knees, bilateral 06/02/2018  ? Hyperlipidemia associated with type 2 diabetes mellitus ([place]) 05/06/2018  ? Pain of lower extremity 03/06/2018  ? Duodenitis 01/30/2018  ? Hematochezia   ? Melena   ? Rectal bleeding 01/11/2018  ? AVM (arteriovenous malformation) of colon without hemorrhage   ? Schatzki's ring   ? Esophageal dysphagia   ? Diabetic retinopathy ([place]) 07/08/2017  ? Cardiomyopathy ([place]) 06/26/2017  ? Centrilobular emphysema ([place]) 06/13/2017  ? Fatigue 12/18/2016  ? Stable angina ([place]) 09/20/2016  ? Stage 3a chronic kidney disease ([place]) 09/20/2016  ? Paroxysmal atrial fibrillation ([place]) 08/28/2016  ? Chronic anticoagulation 08/28/2016  ? Essential hypertension 08/20/2016  ? 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 heart with stable angina pectoris ([place]) 08/19/2016  ? Exertional dyspnea 08/12/2016  ? 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[place]) 12/29/2015  ? Osteomyelitis of great toe of right  foot ([place]) 10/03/2015  ? Ulcerated, foot, right, with necrosis of bone ([place]) 09/26/2015  ? Gynecomastia, male 03/31/2014  ? Hypertrophy of breast 03/31/2014  ? Nuclear sclerosis of left eye 10/22/2013  ? Enthesopathy of ankle and tarsus 04/10/2013  ? Controlled type 2 diabetes mellitus with diabetic nephropathy, with long-term current use of insulin ([place]) 04/09/2013  ? Diabetic neuropathy associated with type 2 diabetes mellitus ([place]) 04/09/2013  ? Cataract of left eye 11/25/2011  ? Corneal opacity 09/30/2011  ? Pseudophakia of right eye 09/30/2011  ? GERD (gastroesophageal reflux disease) 09/25/2011  ? Gout 09/25/2011  ? Hyperlipidemia LDL goal <70 09/25/2011  ? Status post cataract extraction 04/11/2011  ? Cataract extraction status of eye 04/11/2011  ? Iron deficiency anemia 04/01/2011  ? Benign localized hyperplasia of prostate without urinary obstruction and other lower urinary tract symptoms (LUTS) 12/10/2010  ? Enlarged prostate without lower urinary tract symptoms (luts) 12/10/2010  ? Colon cancer screening 10/11/2010  ? Hypothyroidism 10/11/2010  ? Hyperlipidemia 09/20/2003  ? Essential  and other specified forms of tremor 01/05/2001  ? Essential tremor 01/05/2001  ? ? ?Conditions to be addressed/monitored:CHF, CAD, HTN, HLD, COPD, DMII, CKD Stage 3, and Chronic pain, GERD ? ?Care Plan : RNCM: General Plan of Care (Adult) for Chronic Disease Management and Care Cooerdination Needs  ?Updates made by Vanita Ingles, RN since 06/21/2021 12:00 AM  ?  ? ?Problem: RNCM: Development of Plan of Care for Chronic Disease Management (HF, CAD, HTN,HLD, DM, COPD, CKD3, Chronic Pain)   ?Priority: High  ?  ? ?Long-Range Goal: RNCM: Effective Management  of Plan of Care for Chronic Disease Management (HF, CAD, HTN,HLD, DM, COPD, CKD3, Chronic Pain)   ?Start Date: 01/22/2021  ?Expected End Date: 01/22/2022  ?Priority: High  ?Note:   ?Current Barriers:  ?Knowledge Deficits related to plan of care for management of CHF, CAD, HTN,  HLD, COPD, DMII, CKD Stage 3, and chronic pain  ?Care Coordination needs related to Level of care concerns and multiple chronic conditions  ?Chronic Disease Management support and education needs related t

## 2021-06-21 NOTE — Patient Instructions (Signed)
Visit Information ? ?Thank you for taking time to visit with me today. Please don't hesitate to contact me if I can be of assistance to you before our next scheduled telephone appointment. ? ?Following are the goals we discussed today:  ?RNCM Clinical Goal(s):  ?Patient will verbalize understanding of plan for management of CHF, CAD, HTN, HLD, COPD, DMII, CKD Stage 3, and Osteoarthritis as evidenced by understanding the plan of care, calling the provider for changes, and working with the CCM to optimize health and well being for effective management of chronic conditions ?take all medications exactly as prescribed and will call provider for medication related questions as evidenced by taking medications as prescribed, contacting providers for questions about medications, and working with the pharm D to manage polypharmacy and medications concerns     ?attend all scheduled medical appointments:  06-21-2021: The patient has had several appointments since last outreach. The patient has upcoming visit on 07-16-2021 with the pcp, the RNCM will plan to see the patient this day also as evidenced  by keeping appointments with pcp and other specialist and calling for new appointments needed or questions        ?demonstrate improved and ongoing adherence to prescribed treatment plan for CHF, CAD, HTN, HLD, COPD, DMII, CKD Stage 3, and Osteoarthritis as evidenced by working with the CCM team to optimize health and well being  ?demonstrate a decrease in CHF, CAD, HTN, HLD, COPD, DMII, CKD Stage 3, and Osteoarthritis exacerbations  as evidenced by calling the provider for changes in conditions, stable chronic conditions and working with the CCM team to effectively manage health and well being  ?demonstrate ongoing self health care management ability for effective management of chronic conditions  as evidenced by  working with the CCM team  through collaboration with Consulting civil engineer, provider, and care team.  ?  ?Interventions: ?1:1  collaboration with primary care provider regarding development and update of comprehensive plan of care as evidenced by provider attestation and co-signature ?Inter-disciplinary care team collaboration (see longitudinal plan of care) ?Evaluation of current treatment plan related to  self management and patient's adherence to plan as established by provider ?  ?  ?SDOH Barriers (Status: Goal on Track (progressing): YES.) Long Term Goal  ?Patient interviewed and SDOH assessment performed ?       ?Patient interviewed and appropriate assessments performed ?Provided patient with information about resources in [facility] and care guides available for assistance with changes in SDOH, questions or concerns ?Discussed plans with patient for ongoing care management follow up and provided patient with direct contact information for care management team ?Advised patient to call the office for changes in SDOH, new concerns or questions  ?Provided education to patient/caregiver regarding level of care options. ?Care guide referral for expressed need of someone to install rails or a sliding door with rails to the bathroom. Instructed the patients wife that the care guide team would reach out to her concerning resources in [place]. (04-19-2021) ?  ?  ?  ?CAD  (Status: Goal on Track (progressing): YES.) Long Term Goal  ?   ?BP Readings from Last 3 Encounters:  ?05/23/21 (!) 148/81  ?05/18/21 116/73  ?05/16/21 110/80  ?  ?     ?Lab Results  ?Component Value Date  ?  CHOL 123 06/29/2020  ?  HDL 29 (L) 06/29/2020  ?  [place] 65 06/29/2020  ?  TRIG 170 (H) 06/29/2020  ?  CHOLHDL 4.2 06/29/2020  ?  ?Assessed  understanding of CAD diagnosis. 06-21-2021: The patient and his wife are compliant with a plan of care. They see the cardiologist on a consistent basis.  ?Medications reviewed including medications utilized in CAD treatment plan. 07-21-2021: The patient is compliant with medications.  ?Provided education on importance of blood  pressure control in management of CAD; ?Provided education on Importance of limiting foods high in cholesterol. 04-19-2021: Review of dietary intake and patient is compliant with heart healthy/ADA diet. Education on foods to avoid since the patient is having diarrhea off and on. The patients wife is very attentive to the needs of the patient and watches what he eats and does. 06-21-2021: The patients wife states the patients diarrhea is much better now. She says that he is eating good. [name] stable and no new concerns. ?Counseled on importance of regular laboratory monitoring as prescribed. 06-21-2021: Has blood work on a regular basis; ?Reviewed Importance of taking all medications as prescribed. 06-21-2021: Review of medications and patient is compliant with medications. Denies any medication needs or changes in cardiac medications. Is working with the pharm D on a regular basis.  ?Reviewed Importance of attending all scheduled provider appointments. Next pcp appointment on 07-16-2021. Keeps appointments, calls for changes and needs ?Advised to report any changes in symptoms or exercise tolerance ?Screening for signs and symptoms of depression related to chronic disease state;  ?Assessed social determinant of health barriers;  ?  ?Heart Failure Interventions:  (Status: Goal on Track (progressing): YES.)  Long Term Goal  ?Basic overview and discussion of pathophysiology of Heart Failure reviewed; ?Provided education on low sodium diet. 03-21-2021: The patient is compliant with heart healthy/ADA diet. Is not feeling the best today because of some diarrhea but wife states he is eating and drinking well. 06-21-2021: Review of heart healthy/ADA diet with the patients wife. The patient is having a good day today. Denies any fluid overload ?Reviewed Heart Failure Action Plan in depth and provided written copy; ?Assessed need for readable accurate scales in home. 06-21-2021: Participates in the HF program and weighs daily on a provided  mat that send readings into the HF clinic for review and recommendations is any. Per the patients wife he is doing well and has not had an exacerbations in his HF. Reports weight daily.  ?Provided education about placing scale on hard, flat surface; ?Advised patient to weigh each morning after emptying bladder; ?Discussed importance of daily weight and advised patient to weigh and record daily; ?Reviewed role of diuretics in prevention of fluid overload and management of heart failure. 06-21-2021: Is compliant with the plan of care for diuretics. Actively participates in HF clinic and reporting weights daily; ?Discussed the importance of keeping all appointments with provider. 06-21-2021: Reminder provided for upcoming appointment with the pcp on 07-16-2021 at 140 pm ?Provided patient with education about the role of exercise in the management of heart failure; ?Advised patient to discuss torsemide dose  with provider.  01-22-2021: The patients wife states that she has not heard back from the heart failure clinic on the Torsemide dose. Advised the patients wife to call the HF clinic back and request clarification on Torsemide dose. The patients wife verbalized understanding. She states he is not having any issues with swelling but he is having a problem with "holding his water" and its been going on for a while. 01-24-2021: The pharm D is also assisting with getting in touch with the cardiologist/HF clinic to get clarification of medication dosage. 06-21-2021: The patient is taking  medications as directed. Denies any issues with medication compliance.  ?EF from August 2022 is 30 to 35% ?  ?Chronic Kidney Disease (Status: Goal on Track (progressing): YES.)  Long Term Goal  ?Last practice recorded BP readings:  ?   ?BP Readings from Last 3 Encounters:  ?05/23/21 (!) 148/81  ?05/18/21 116/73  ?05/16/21 110/80  ?Most recent eGFR/CrCl:  ?     ?Lab Results  ?Component Value Date  ?  EGFR 55 (L) 05/16/2021  ?  No components found  for: CRCL ?  ?Assessed the patient   and patients wife for   understanding of chronic kidney disease    ?Evaluation of current treatment plan related to chronic kidney disease self management and patien

## 2021-06-21 NOTE — Telephone Encounter (Addendum)
?*  STAT* If patient is at the pharmacy, call can be transferred to refill team. ? ? ?1. Which medications need to be refilled? (please list name of each medication and dose if known) losartan (COZAAR) 25 MG tablet ?torsemide (DEMADEX) 20 MG tablet ? ?2. Which pharmacy/location (including street and city if local pharmacy) is medication to be sent to?[place], [place] ? ?3. Do they need a 30 day or 90 day supply? 90 day  ?

## 2021-06-25 ENCOUNTER — Telehealth: Payer: Medicare HMO

## 2021-06-27 ENCOUNTER — Ambulatory Visit: Payer: Medicare HMO | Admitting: Pharmacist

## 2021-06-27 DIAGNOSIS — E11621 Type 2 diabetes mellitus with foot ulcer: Secondary | ICD-10-CM

## 2021-06-27 MED ORDER — ONETOUCH DELICA PLUS LANCET30G MISC: 3 refills | Status: DC

## 2021-06-27 NOTE — Chronic Care Management (AMB) (Signed)
? ?Chronic Care Management ?CCM Pharmacy Note ? ?06/27/2021 ?Name:  Lucas Caldwell MRN:  [id number] DOB:  [birth year] ? ?Subjective: ?Lucas Caldwell is an 88 y.o. year old male who is a primary patient of [name], DO.  The CCM team was consulted for assistance with disease management and care coordination needs.   ? ?Engaged with patient by telephone for follow up visit for pharmacy case management and/or care coordination services.  ? ?Objective: ? ?Medications Reviewed Today   ? ? Reviewed by Vanita Ingles, RN (Case Manager) on 06/21/21 at [place] List Status: <None>  ? ?Medication Order Taking? Sig Documenting Provider Last Dose Status Informant  ?acetaminophen (TYLENOL) 500 MG tablet [id number] No Take 1,000 mg by mouth every 6 (six) hours as needed for moderate pain or headache.  [provider] Taking Active Spouse/Significant Other  ?Alcohol Swabs (B-D SINGLE USE SWABS REGULAR) PADS [id number] No Use to check blood sugar up to 2 times daily [name], DO Taking Active Spouse/Significant Other  ?allopurinol (ZYLOPRIM) 100 MG tablet [id number] No Take 1 tablet (100 mg total) by mouth daily. Bensimhon, Shaune Pascal, MD Taking Active   ?aspirin 81 MG chewable tablet [id number] No Chew 81 mg by mouth daily. [provider] Taking Active Spouse/Significant Other  ?bosutinib (BOSULIF) 100 MG tablet [id number] No Take 100 mg by mouth at bedtime. Take with food. [provider] Taking Active Spouse/Significant Other  ?         ?Med Note Jeanie Cooks Sep 15, 2019  8:38 AM)    ?Cholecalciferol 25 MCG (1000 UT) capsule [id number] No Take 1,000 Units by mouth daily. [provider] Taking Active Spouse/Significant Other  ?famotidine (PEPCID) 40 MG tablet [id number] No Take 1 tablet (40 mg total) by mouth 2 (two) times daily with a meal. Parks Ranger, [name], DO Taking Active   ?gabapentin (NEURONTIN) 300 MG capsule [id number] No Take 300 mg by  mouth 3 (three) times daily. [provider] Taking Active   ?Insulin Glargine ([name] KWIKPEN) 100 UNIT/ML [id number] No Inject 20 Units into the skin daily. [provider] Taking Active   ?Insulin Pen Needle 31G X 5 MM MISC [id number]  Use to inject insulin daily. [name], DO  Active   ?isosorbide mononitrate (IMDUR) 60 MG 24 hr tablet [id number] No Take 0.5 tablets (30 mg total) by mouth daily. Theora Gianotti, NP Taking Active Spouse/Significant Other  ?JARDIANCE 10 MG TABS tablet [id number] No Take 1 tablet (10 mg total) by mouth daily with breakfast. [name], DO Taking Active Spouse/Significant Other  ?levothyroxine (SYNTHROID) 25 MCG tablet [id number] No TAKE ONE TABLET BY MOUTH EVERY MORNING BEFORE BREAKFAST Karamalegos, [name], DO Taking Active Spouse/Significant Other  ?losartan (COZAAR) 25 MG tablet [id number] No Take 12.5 mg by mouth daily. [provider] Taking Active Spouse/Significant Other  ?Menthol-Zinc Oxide (GOLD BOND EX) [id number] No Apply 1 application topically daily as needed (muscle pain). [provider] Taking Active Spouse/Significant Other  ?Multiple Vitamin (MULTIVITAMIN WITH MINERALS) TABS tablet [id number] No Take 1 tablet by mouth daily. [provider] Taking Active Spouse/Significant Other  ?nitroGLYCERIN (NITROSTAT) 0.4 MG SL tablet [id number] No Place 1 tablet (0.4 mg total) under the tongue every 5 (five) minutes x 3 doses as needed for chest pain. Theora Gianotti, NP Taking Active Spouse/Significant Other  ?ONETOUCH VERIO test strip [id number]  Check blood sugar up to 2 x daily [name], DO  Active   ?Polyethyl Glycol-Propyl Glycol (SYSTANE OP) [id number] No Place 1 drop into both eyes 3 (three) times daily as needed (dry/irritated eyes.).  [provider] Taking Active Spouse/Significant Other  ?polyethylene glycol (MIRALAX / GLYCOLAX) 17 g packet [id number]  No Take 17 g by mouth daily as needed for mild constipation.  [provider] Taking Active Spouse/Significant Other  ?ranolazine (RANEXA) 500 MG 12 hr tablet [id number] No TAKE ONE TABLET BY MOUTH TWICE DAILY End, [name], MD Taking Active   ?rosuvastatin (CRESTOR) 5 MG tablet [id number] No Take 1 tablet (5 mg total) by mouth daily. End, [name], MD Taking Active   ?tamsulosin (FLOMAX) 0.4 MG CAPS capsule [id number] No Take 1 capsule (0.4 mg total) by mouth daily after supper. Rise Mu, PA-C Taking Active Spouse/Significant Other  ?torsemide (DEMADEX) 20 MG tablet [id number] No Take 2 tablets (40 mg total) by mouth daily. Darrick Grinder D, NP Taking Active   ?traMADol (ULTRAM) 50 MG tablet [id number] No Take 1-2 tablets (50-100 mg total) by mouth at bedtime as needed. [name], DO Taking Active Spouse/Significant Other  ?warfarin (COUMADIN) 3 MG tablet [id number] No TAKE 1 TO 1 & 1/2 TABLETS BY MOUTH AS DIRECTED BY THE COUMADIN CLINIC Strength: 3 mg End, [name], MD Taking Active   ? ?  ?  ? ?  ? ? ?Pertinent Labs:  ?Lab Results  ?Component Value Date  ? HGBA1C 7.3 (H) 02/21/2021  ? ?Lab Results  ?Component Value Date  ? CHOL 123 06/29/2020  ? HDL 29 (L) 06/29/2020  ? [place] 65 06/29/2020  ? TRIG 170 (H) 06/29/2020  ? CHOLHDL 4.2 06/29/2020  ? ?Lab Results  ?Component Value Date  ? CREATININE 1.27 05/16/2021  ? BUN 23 05/16/2021  ? NA 138 05/16/2021  ? K 3.9 05/16/2021  ? CL 99 05/16/2021  ? CO2 23 05/16/2021  ? ?BP Readings from Last 3 Encounters:  ?05/23/21 (!) 148/81  ?05/18/21 116/73  ?05/16/21 110/80  ? ?Pulse Readings from Last 3 Encounters:  ?05/23/21 85  ?05/18/21 81  ?05/16/21 89  ? ? ? ?SDOH:  (Social Determinants of Health) assessments and interventions performed:  ? ? ?CCM Care Plan ? ?Review of patient past medical history, allergies, medications, health status, including review of consultants reports, laboratory and other test data, was performed as part of  comprehensive evaluation and provision of chronic care management services.  ? ?Care Plan : PharmD - Medication Management/Assistance  ?Updates made by Rennis Petty, RPH-CPP since 06/27/2021 12:00 AM  ?  ? ?Problem: Disease Progression   ?  ? ?Long-Range Goal: Disease Progression Prevented or Minimized   ?Start Date: 04/14/2020  ?Expected End Date: 07/13/2020  ?Recent Progress: On track  ?Priority: High  ?Note:   ?Current Barriers:  ?Chronic Disease Management support, education, and care coordination needs related to type 2 diabetes, CKD, HFrEF s/p CardioMEMS, hyperlipidemia, atrial fibrillation, hypertension, coronary artery disease, CHF, hypothyroidism, and CML ?Financial ?Note patient has Partial Extra Help/LIS Subsidy ?Patient enrolled in [place] patient assistance program for 2023 calendar year ?Patient denied for patient assistance for [place] from [facility] for 2022 calendar year ?Patient previously enrolled in [place] for Jardiance copayment assistance, but eligibility expected to expire on 06/09/2021 as not in use (currently no copayment through Part D plan ?Limited vision ? ?Pharmacist Clinical Goal(s):  ?Over the next 90 days, patient will verbalize ability to afford treatment regimen through collaboration with PharmD and provider.  ? ?Interventions: ?1:1 collaboration with  [name], DO regarding development and update of comprehensive plan of care as evidenced by provider attestation and co-signature ?Inter-disciplinary care team collaboration (see longitudinal plan of care) ?Today patient/spouse report patient is having diarrhea again (started again over the weekend) ?Denies patient having had changes in diet ?Denies having had greasy/fried foods or green tea ?Note patient has been drinking ginger ale zero soda at lunch time ?Encourage patient to stop drinking soda as some artificial sweeteners can cause diarrhea ?Reports staying hydrated with  water and will use Imodium AD as needed for diarrhea for now ?Advise patient/wife if symptoms not improving to follow up with PCP ?Will collaborate with PCP ? ?Type 2 Diabetes: ?Current treatment: ?[name] 20 units

## 2021-06-27 NOTE — Patient Instructions (Signed)
Visit Information ? ?Thank you for taking time to visit with me today. Please don't hesitate to contact me if I can be of assistance to you before our next scheduled telephone appointment. ? ?Following are the goals we discussed today:  ? Goals Addressed   ? ?  ?  ?  ?  ? This Visit's Progress  ?  Pharmacy Goals     ?  Please check your blood sugar, keep a log of the results and bring this with you to your medical appointments. ? ?Please check your home blood pressure, keep a log of the results and bring this with you to your medical appointments. ? ?Our goal bad cholesterol, or LDL, is less than 70 . This is why it is important to continue taking your rosuvastatin. ? ?Feel free to call me with any questions or concerns. I look forward to our next call! ? ?[name], PharmD, BCACP ?Clinical Pharmacist ?[facility] ?[place] ?[phone] ?  ? ?  ? ? ? ?Our next appointment is by telephone on 08/20/2021 at 10:00 AM ? ?Please call the care guide team at [phone] if you need to cancel or reschedule your appointment.  ? ? ?The patient verbalized understanding of instructions, educational materials, and care plan provided today and declined offer to receive copy of patient instructions, educational materials, and care plan.  ? ?

## 2021-06-29 DIAGNOSIS — G4733 Obstructive sleep apnea (adult) (pediatric): Secondary | ICD-10-CM | POA: Diagnosis not present

## 2021-06-29 DIAGNOSIS — M75122 Complete rotator cuff tear or rupture of left shoulder, not specified as traumatic: Secondary | ICD-10-CM | POA: Diagnosis not present

## 2021-06-29 DIAGNOSIS — M75121 Complete rotator cuff tear or rupture of right shoulder, not specified as traumatic: Secondary | ICD-10-CM | POA: Diagnosis not present

## 2021-06-29 DIAGNOSIS — M12811 Other specific arthropathies, not elsewhere classified, right shoulder: Secondary | ICD-10-CM | POA: Diagnosis not present

## 2021-06-29 DIAGNOSIS — M12812 Other specific arthropathies, not elsewhere classified, left shoulder: Secondary | ICD-10-CM | POA: Diagnosis not present

## 2021-07-02 ENCOUNTER — Telehealth (HOSPITAL_COMMUNITY): Payer: Self-pay | Admitting: Adult Health

## 2021-07-02 ENCOUNTER — Telehealth: Payer: Self-pay | Admitting: Internal Medicine

## 2021-07-02 MED ORDER — ISOSORBIDE MONONITRATE ER 60 MG PO TB24: 30.0000 mg | ORAL_TABLET | Freq: Every day | ORAL | 0 refills | Status: DC

## 2021-07-02 NOTE — Telephone Encounter (Signed)
? ?  Cardiomems Goal 15---> Todays reading is 20.  ? ? ?Continue torsemide 40 mg daily and please call and instruct to take an extra 20 mg torsemide x 2 days.  ? ? ?Raseel Jans NP-C  ?2:14 PM ? ?

## 2021-07-02 NOTE — Telephone Encounter (Signed)
?*  STAT* If patient is at the pharmacy, call can be transferred to refill team. ? ? ?1. Which medications need to be refilled? (please list name of each medication and dose if known) isosorbide mononitrate (IMDUR) 60 MG 24 hr tablet ? ?2. Which pharmacy/location (including street and city if local pharmacy) is medication to be sent to? [place], [place] [facility] ? ?3. Do they need a 30 day or 90 day supply? 90 day  ?

## 2021-07-02 NOTE — Telephone Encounter (Signed)
Requested Prescriptions  ? ?Signed Prescriptions Disp Refills  ? isosorbide mononitrate (IMDUR) 60 MG 24 hr tablet 45 tablet 0  ?  Sig: Take 0.5 tablets (30 mg total) by mouth daily.  ?  Authorizing Provider: [name] RONALD  ?  Ordering User: NEWCOMER MCCLAIN, Rambo Sarafian L  ? ? ?

## 2021-07-03 NOTE — Telephone Encounter (Signed)
Attempted to reach patient ?No answer and unable to leave voicemail as box is full ?Phone:  ?[phone] (M) ?

## 2021-07-04 NOTE — Telephone Encounter (Addendum)
[phone] (M) ?No answer unable to leave message voicemail full ? ? ?[phone] (H) no answer unable to leave message ?

## 2021-07-06 ENCOUNTER — Encounter: Payer: Self-pay | Admitting: Internal Medicine

## 2021-07-06 ENCOUNTER — Other Ambulatory Visit
Admission: RE | Admit: 2021-07-06 | Discharge: 2021-07-06 | Disposition: A | Discharge status: Home / Self Care | Payer: Medicare HMO | Attending: Internal Medicine | Admitting: Internal Medicine

## 2021-07-06 ENCOUNTER — Telehealth: Payer: Self-pay | Admitting: Internal Medicine

## 2021-07-06 ENCOUNTER — Ambulatory Visit (INDEPENDENT_AMBULATORY_CARE_PROVIDER_SITE_OTHER): Payer: Medicare HMO | Admitting: Internal Medicine

## 2021-07-06 VITALS — BP 120/62 | HR 91 | Ht 64.0 in | Wt 194.0 lb

## 2021-07-06 DIAGNOSIS — E785 Hyperlipidemia, unspecified: Secondary | ICD-10-CM | POA: Insufficient documentation

## 2021-07-06 DIAGNOSIS — Z79899 Other long term (current) drug therapy: Secondary | ICD-10-CM

## 2021-07-06 DIAGNOSIS — E1169 Type 2 diabetes mellitus with other specified complication: Secondary | ICD-10-CM | POA: Insufficient documentation

## 2021-07-06 DIAGNOSIS — I5023 Acute on chronic systolic (congestive) heart failure: Secondary | ICD-10-CM | POA: Diagnosis not present

## 2021-07-06 DIAGNOSIS — I5022 Chronic systolic (congestive) heart failure: Secondary | ICD-10-CM | POA: Insufficient documentation

## 2021-07-06 DIAGNOSIS — I480 Paroxysmal atrial fibrillation: Secondary | ICD-10-CM | POA: Diagnosis not present

## 2021-07-06 DIAGNOSIS — I25118 Atherosclerotic heart disease of native coronary artery with other forms of angina pectoris: Secondary | ICD-10-CM

## 2021-07-06 LAB — BASIC METABOLIC PANEL
Anion gap: 10 (ref 5–15)
BUN: 23 mg/dL (ref 8–23)
CO2: 22 mmol/L (ref 22–32)
Calcium: 9.2 mg/dL (ref 8.9–10.3)
Chloride: 105 mmol/L (ref 98–111)
Creatinine, Ser: 1.24 mg/dL (ref 0.61–1.24)
GFR, Estimated: 57 mL/min — ABNORMAL LOW (ref >60)
Glucose, Bld: 140 mg/dL — ABNORMAL HIGH (ref 70–99)
Potassium: 4.5 mmol/L (ref 3.5–5.1)
Sodium: 137 mmol/L (ref 135–145)

## 2021-07-06 LAB — LIPID PANEL
Cholesterol: 163 mg/dL (ref 0–200)
HDL: 37 mg/dL — ABNORMAL LOW (ref >40)
LDL Cholesterol: 89 mg/dL (ref 0–99)
Total CHOL/HDL Ratio: 4.4 RATIO
Triglycerides: 186 mg/dL — ABNORMAL HIGH (ref <150)
VLDL: 37 mg/dL (ref 0–40)

## 2021-07-06 NOTE — Patient Instructions (Signed)
Medication Instructions:  ? ?Your physician has recommended you make the following change in your medication:  ? ?INCREASE Torsemide - Take 40 mg in the MORNING and 20 mg in the AFTERNOON x 3 DAYS ? ?  - AFTER 3 days RESUME Torsemide 40 mg daily  ? ?*If you need a refill on your cardiac medications before your next appointment, please call your pharmacy* ? ? ?Lab Work: ? ?Today at the medical mall: [facility] ? ?-  Please go to the [facility].  ?-  You will check in at the front desk to the right as you walk into the atrium.  ? ? ?If you have labs (blood work) drawn today and your tests are completely normal, you will receive your results only by: ?MyChart Message (if you have MyChart) OR ?A paper copy in the mail ?If you have any lab test that is abnormal or we need to change your treatment, we will call you to review the results. ? ? ?Testing/Procedures: ? ?None ordered ? ? ?Follow-Up: ?At [facility], you and your health needs are our priority.  As part of our continuing mission to provide you with exceptional heart care, we have created designated Provider Care Teams.  These Care Teams include your primary Cardiologist (physician) and Advanced Practice Providers (APPs -  Physician Assistants and Nurse Practitioners) who all work together to provide you with the care you need, when you need it. ? ?We recommend signing up for the patient portal called "MyChart".  Sign up information is provided on this After Visit Summary.  MyChart is used to connect with patients for Virtual Visits (Telemedicine).  Patients are able to view lab/test results, encounter notes, upcoming appointments, etc.  Non-urgent messages can be sent to your provider as well.   ?To learn more about what you can do with MyChart, go to [email or web address].   ? ?Your next appointment:   ? ?2 - 3  week(s) ? ?The format for your next appointment:   ?In Person ? ?Provider:   ?You may see [name], MD or one of the following Advanced  Practice Providers on your designated Care Team:   ?[name], NP ?[name], PA-C ?Cadence Kathlen Mody, PA-C ? ?Important Information About Sugar ? ? ? ? ? ? ?

## 2021-07-06 NOTE — Addendum Note (Signed)
Addended by: Darlyne Russian on: 07/06/2021 01:37 PM ? ? Modules accepted: Orders ? ?

## 2021-07-06 NOTE — Progress Notes (Signed)
? ?Follow-up Outpatient Visit ?Date: 07/06/2021 ? ?Primary Care Provider: ?Lucas Hauser, DO ?[address] ?[place] [address] ? ?Chief Complaint: Trouble breathing and abdominal pain ? ?HPI:  Lucas Caldwell is a 88 y.o. 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  Lucas Caldwell reports that over the last 2 weeks, he has experienced progressive shortness of breath and abdominal distention.  He gets particularly short of breath when he bends over.  He does not recall any clear trigger for this, though on further questioning, he had a GI illness around the time that this began with associated diarrhea.  His bowels have returned to normal.  He has not had any bloody stools.  [name] currently taking MiraLAX.  He denies chest pain, palpitations, and lightheadedness.  He has been compliant with his medications.  The advanced heart failure clinic recently noted rise in PA diastolic pressure to 20 (goal 15).  They attempted to reach out to Lucas Caldwell with instructions to increase his torsemide but were unable to reach him. ? ?Lucas Caldwell is also concerned about 18 and weakness in both shoulders that prevents him from raising his arms.  [name] scheduled to begin physical therapy soon but was told that he may also require surgery.  [name] not sure about the safety of surgery given his underlying heart problems.  He also notes vivid dreams and is concerned that they could be a sign that his stents are  failing. ? ?-------------------------------------------------------------------------------------------------- ? ?Past Medical History:  ?Diagnosis Date  ? Arthritis   ? "probably in his legs" (08/15/2016)  ? Blind right eye   ? BPH (benign prostatic hyperplasia)   ? Breast asymmetry   ? Left breast is larger, present for several years.  ? CAD (coronary artery disease)   ? a. Cath in the late 90's - reportedly ok;  b. 2014 s/p stenting x 2 @ [facility]; c 08/19/16 Cath/PCI with DES -> RCA, plan to treat LM 70% medically. Seen by surgery and felt to be too high risk for CABG; d. 08/2018 Cath: LM 70 (iFR 0.86), LAD 20ost, 40p, 50/79m D1 70, LCX patent stent, OM1 30, RCA patent stent, 419mSR, 40d, RPDA 30. PCWP 8. CO/CI 4.0/2.1; e. 03/2019 PCI to LAD (2.5x15 Resolute Onyx DES).  ? Chronic combined systolic (congestive) and diastolic (congestive) heart failure (HCC)   ? a. Previously reduced EF-->50% by echo in 2012;  b. 06/2015 Echo: EF 50-55%  c. 07/2016 Echo: EF 45-50%; d. 12/2017 Echo: EF 55%; e. 08/2018 Echo: EF 35-40%; f. 04/2019 Echo: EF 30-35%; g. s/p cardiomems; h. 10/2020 Echo: EF 30-35%, mod-sev glob HK, gr1 DD.  ? Chronic kidney disease (CKD), stage IV (severe) (HCC)   ? CML (chronic myelocytic leukemia) ([place]  ? GERD (gastroesophageal reflux disease)   ? GIB (gastrointestinal bleeding)   ? a. 12/2017 3 unit PRBC GIB in setting of coumadin-->Endo/colon multiple duodenal ulcers and a single bleeding ulcer in the proximal ascending colon status post hemostatic clipping x2.  ? Gout   ? Hyperlipemia   ? Hypertension   ? Hypothyroidism   ? Iron  deficiency anemia   ? Ischemic cardiomyopathy   ? a. Previously reduced EF-->50% by echo in 2012;  b. 06/2015 Echo: EF 50-55%, Gr2 DD;  c. 07/2016 Echo: EF 45-50%; d. 12/2017 Echo: EF 55%, Gr1 DD, mild MR; e. 08/2018 Echo: EF 35-40%; f. 04/2019 Echo: EF 30-35%; g. 10/2020 Echo: EF 30-35%.  ? Migraine   ? "in the 1960s" (08/15/2016)  ? Obstructive sleep apnea   ? Orthostatic hypotension   ? a.  20222 -->carvedilol and hydralazine d/c'd.  ? PAF (paroxysmal atrial fibrillation) ([place])   ? a. ? Dx 2014-->s/p DCCV;  b. CHA2DS2VASc = 6--> Coumadin.  ? Prostate cancer ([place])   ? RBBB   ? Type II diabetes mellitus ([place])   ? ?Past Surgical History:  ?Procedure Laterality Date  ? AMPUTATION TOE Right 02/23/2021  ? Procedure: AMPUTATION TOE;  Surgeon: Samara Deist, DPM;  Location: [facility] ORS;  Service: Podiatry;  Laterality: Right;  ? CATARACT EXTRACTION W/ INTRAOCULAR LENS  IMPLANT, BILATERAL Bilateral   ? COLONOSCOPY N/A 01/13/2018  ? Procedure: COLONOSCOPY;  Surgeon: Lin Landsman, MD;  Location: [facility] ENDOSCOPY;  Service: Gastroenterology;  Laterality: N/A;  ? COLONOSCOPY WITH PROPOFOL N/A 01/01/2018  ? Procedure: COLONOSCOPY WITH PROPOFOL;  Surgeon: Lin Landsman, MD;  Location: [facility] ENDOSCOPY;  Service: Gastroenterology;  Laterality: N/A;  ? CORONARY ANGIOPLASTY WITH STENT PLACEMENT  09/2012  ? 2 stents  ? CORONARY STENT INTERVENTION N/A 08/19/2016  ? Procedure: Coronary Stent Intervention;  Surgeon: [name], MD;  Location: [place] CV LAB;  Service: Cardiovascular;  Laterality: N/A;  ? CORONARY STENT INTERVENTION N/A 04/13/2019  ? Procedure: CORONARY STENT INTERVENTION;  Surgeon: [name], MD;  Location: [place] CV LAB;  Service: Cardiovascular;  Laterality: N/A;  ? ESOPHAGEAL MANOMETRY N/A 12/16/2017  ? Procedure: ESOPHAGEAL MANOMETRY (EM);  Surgeon: Lin Landsman, MD;  Location: [facility] ENDOSCOPY;  Service: Gastroenterology;  Laterality: N/A;  ? ESOPHAGOGASTRODUODENOSCOPY N/A 12/20/2016  ? Procedure: ESOPHAGOGASTRODUODENOSCOPY (EGD);  Surgeon: Lin Landsman, MD;  Location: [facility] ENDOSCOPY;  Service: Gastroenterology;  Laterality: N/A;  ? ESOPHAGOGASTRODUODENOSCOPY N/A 01/13/2018  ? Procedure: ESOPHAGOGASTRODUODENOSCOPY (EGD);  Surgeon: Lin Landsman, MD;  Location: [facility] ENDOSCOPY;  Service: Gastroenterology;  Laterality: N/A;  ? ESOPHAGOGASTRODUODENOSCOPY (EGD)  WITH PROPOFOL N/A 11/03/2017  ? Procedure: ESOPHAGOGASTRODUODENOSCOPY (EGD) WITH PROPOFOL;  Surgeon: Lin Landsman, MD;  Location: [facility] ENDOSCOPY;  Service: Gastroenterology;  Laterality: N/A;  ? EYE SURGERY    ? HERNIA REPAIR    ? "[place]"  ? LEFT HEART CATH AND CORONARY ANGIOGRAPHY N/A 08/14/2016  ? Procedure: Left Heart Cath and Coronary Angiography;  Surgeon: [name], MD;  Location: [place] CV LAB;  Service: Cardiovascular;  Laterality: N/A;  ? PRESSURE SENSOR/CARDIOMEMS N/A 09/23/2019  ? Procedure: PRESSURE SENSOR/CARDIOMEMS;  Surgeon: Jolaine Artist, MD;  Location: [place] CV LAB;  Service: Cardiovascular;  Laterality: N/A;  ? RIGHT HEART CATH N/A 09/23/2019  ? Procedure: RIGHT HEART CATH;  Surgeon: Jolaine Artist, MD;  Location: [place] CV LAB;  Service: Cardiovascular;  Laterality: N/A;  ? RIGHT/LEFT HEART CATH AND CORONARY ANGIOGRAPHY N/A 09/01/2018  ? Procedure: RIGHT/LEFT HEART CATH AND CORONARY ANGIOGRAPHY;  Surgeon: [name], MD;  Location: [place] CV LAB;  Service: Cardiovascular;  Laterality: N/A;  ? RIGHT/LEFT HEART CATH AND CORONARY ANGIOGRAPHY N/A 04/13/2019  ? Procedure: RIGHT/LEFT HEART CATH AND CORONARY ANGIOGRAPHY;  Surgeon: [name], MD;  Location: [place] CV LAB;  Service: Cardiovascular;  Laterality: N/A;  ? TOE AMPUTATION Right   ? "  big toe"  ? ? ?Current Meds  ?Medication Sig  ? acetaminophen (TYLENOL) 500 MG tablet Take 1,000 mg by mouth every 6 (six) hours as needed for moderate pain or headache.   ? Alcohol Swabs (B-D SINGLE USE SWABS REGULAR) PADS Use to check blood sugar up to 2 times daily  ? allopurinol (ZYLOPRIM) 100 MG tablet Take 1 tablet (100 mg total) by mouth daily.  ? aspirin 81 MG chewable tablet Chew 81 mg by mouth daily.  ? bosutinib (BOSULIF) 100 MG tablet Take 100 mg by mouth at bedtime. Take with food.  ? Cholecalciferol 25 MCG (1000 UT) capsule Take 1,000 Units by mouth daily.  ? famotidine (PEPCID) 40 MG tablet  Take 1 tablet (40 mg total) by mouth 2 (two) times daily with a meal.  ? gabapentin (NEURONTIN) 300 MG capsule Take 300 mg by mouth 3 (three) times daily.  ? Insulin Glargine ([name] KWIKPEN) 100 UNIT/ML Inject 20 Units into the skin daily.  ? Ins

## 2021-07-06 NOTE — Telephone Encounter (Signed)
Scheduled patient to see Dr. Saunders Revel on 07/06/21 at 10:40 am due to severe heart problems.  ?

## 2021-07-11 ENCOUNTER — Other Ambulatory Visit: Payer: Self-pay | Admitting: Family Medicine

## 2021-07-11 DIAGNOSIS — E1142 Type 2 diabetes mellitus with diabetic polyneuropathy: Secondary | ICD-10-CM

## 2021-07-11 NOTE — Telephone Encounter (Signed)
Copied from [place] [phone]. Topic: General - Other ?>> Jul 11, 2021 10:29 AM Tessa Lerner A wrote: ?Reason for CRM: Medication Refill - Medication: gabapentin (NEURONTIN) 300 MG capsule [[id number]] - patient has 1 capsule remaining  ? ?Has the patient contacted their pharmacy? Yes.  The patient has been directed to contact their PCP ?(Agent: If no, request that the patient contact the pharmacy for the refill. If patient does not wish to contact the pharmacy document the reason why and proceed with request.) ?(Agent: If yes, when and what did the pharmacy advise?) ? ?Preferred Pharmacy (with phone number or street name): [facility] [place], [place] [facility] ?[place] [address] ?Phone: [phone] Fax: [phone] ?Hours: Not open 24 hours ? ?Has the patient been seen for an appointment in the last year OR does the patient have an upcoming appointment? Yes.   ? ?Agent: Please be advised that RX refills may take up to 3 business days. We ask that you follow-up with your pharmacy. ?

## 2021-07-12 MED ORDER — GABAPENTIN 300 MG PO CAPS: 300.0000 mg | ORAL_CAPSULE | Freq: Three times a day (TID) | ORAL | 1 refills | Status: DC

## 2021-07-12 NOTE — Telephone Encounter (Signed)
Requested medication (s) are due for refill today: Yes ? ?Requested medication (s) are on the active medication list: yes ? ?Last refill:  02/23/21 ? ?Future visit scheduled: Yes ? ?Notes to clinic:  Historical provider. ? ? ? ?Requested Prescriptions  ?Pending Prescriptions Disp Refills  ? gabapentin (NEURONTIN) 300 MG capsule    ?  Sig: Take 1 capsule (300 mg total) by mouth 3 (three) times daily.  ?  ? Neurology: Anticonvulsants - gabapentin Passed - 07/12/2021  9:33 AM  ?  ?  Passed - Cr in normal range and within 360 days  ?  Creat  ?Date Value Ref Range Status  ?04/13/2020 1.72 (H) 0.70 - 1.11 mg/dL Final  ?  Comment:  ?  For patients >62 years of age, the reference limit ?for Creatinine is approximately 13% higher for people ?identified as African-American. ?. ?  ? ?Creatinine, Ser  ?Date Value Ref Range Status  ?07/06/2021 1.24 0.61 - 1.24 mg/dL Final  ? ?Creatinine, Urine  ?Date Value Ref Range Status  ?12/18/2016 96 mg/dL Final  ?  ?  ?  ?  Passed - Completed PHQ-2 or PHQ-9 in the last 360 days  ?  ?  Passed - Valid encounter within last 12 months  ?  Recent Outpatient Visits   ? ?      ? 1 month ago Diarrhea, unspecified type  ? [place], DO  ? 1 month ago Nausea  ? [facility], [place], NP  ? 3 months ago Gastroesophageal reflux disease without esophagitis  ? [facility] [place], [name], DO  ? 4 months ago Type 2 diabetes mellitus with diabetic toe ulcer ([place])  ? [place], DO  ? 7 months ago Chronic pain of both shoulders  ? [place], DO  ? ?  ?  ?Future Appointments   ? ?        ? In 4 days [place], [name], DO [facility], [place]  ? In 2 weeks Theora Gianotti, NP [facility]  ? In 1 month Luana Shu [place]  ? In 2 months [place],  [name], MD [facility], [facility]  ? ?  ? ? ?  ?  ?  ? ?

## 2021-07-15 DIAGNOSIS — J432 Centrilobular emphysema: Secondary | ICD-10-CM

## 2021-07-15 DIAGNOSIS — E782 Mixed hyperlipidemia: Secondary | ICD-10-CM

## 2021-07-15 DIAGNOSIS — E11621 Type 2 diabetes mellitus with foot ulcer: Secondary | ICD-10-CM | POA: Diagnosis not present

## 2021-07-15 DIAGNOSIS — I130 Hypertensive heart and chronic kidney disease with heart failure and stage 1 through stage 4 chronic kidney disease, or unspecified chronic kidney disease: Secondary | ICD-10-CM

## 2021-07-15 DIAGNOSIS — I5032 Chronic diastolic (congestive) heart failure: Secondary | ICD-10-CM | POA: Diagnosis not present

## 2021-07-15 DIAGNOSIS — N1831 Chronic kidney disease, stage 3a: Secondary | ICD-10-CM | POA: Diagnosis not present

## 2021-07-15 DIAGNOSIS — Z87891 Personal history of nicotine dependence: Secondary | ICD-10-CM

## 2021-07-15 DIAGNOSIS — E1122 Type 2 diabetes mellitus with diabetic chronic kidney disease: Secondary | ICD-10-CM

## 2021-07-15 DIAGNOSIS — I25118 Atherosclerotic heart disease of native coronary artery with other forms of angina pectoris: Secondary | ICD-10-CM | POA: Diagnosis not present

## 2021-07-15 DIAGNOSIS — L97509 Non-pressure chronic ulcer of other part of unspecified foot with unspecified severity: Secondary | ICD-10-CM | POA: Diagnosis not present

## 2021-07-15 DIAGNOSIS — Z794 Long term (current) use of insulin: Secondary | ICD-10-CM | POA: Diagnosis not present

## 2021-07-16 ENCOUNTER — Ambulatory Visit: Payer: Medicare HMO

## 2021-07-16 ENCOUNTER — Other Ambulatory Visit: Payer: Self-pay | Admitting: Family Medicine

## 2021-07-16 ENCOUNTER — Encounter: Payer: Self-pay | Admitting: Family Medicine

## 2021-07-16 ENCOUNTER — Ambulatory Visit (INDEPENDENT_AMBULATORY_CARE_PROVIDER_SITE_OTHER): Payer: Medicare HMO | Admitting: Family Medicine

## 2021-07-16 VITALS — BP 138/72 | HR 86 | Ht 64.0 in | Wt 196.8 lb

## 2021-07-16 DIAGNOSIS — S98111A Complete traumatic amputation of right great toe, initial encounter: Secondary | ICD-10-CM

## 2021-07-16 DIAGNOSIS — Z794 Long term (current) use of insulin: Secondary | ICD-10-CM

## 2021-07-16 DIAGNOSIS — R351 Nocturia: Secondary | ICD-10-CM

## 2021-07-16 DIAGNOSIS — Z0000 Encounter for general adult medical examination without abnormal findings: Secondary | ICD-10-CM

## 2021-07-16 DIAGNOSIS — E1121 Type 2 diabetes mellitus with diabetic nephropathy: Secondary | ICD-10-CM | POA: Diagnosis not present

## 2021-07-16 DIAGNOSIS — I5032 Chronic diastolic (congestive) heart failure: Secondary | ICD-10-CM

## 2021-07-16 DIAGNOSIS — I25118 Atherosclerotic heart disease of native coronary artery with other forms of angina pectoris: Secondary | ICD-10-CM

## 2021-07-16 DIAGNOSIS — I10 Essential (primary) hypertension: Secondary | ICD-10-CM

## 2021-07-16 DIAGNOSIS — E1169 Type 2 diabetes mellitus with other specified complication: Secondary | ICD-10-CM

## 2021-07-16 MED ORDER — JARDIANCE 10 MG PO TABS: 10.0000 mg | ORAL_TABLET | Freq: Every day | ORAL | 1 refills | Status: DC

## 2021-07-16 NOTE — Patient Instructions (Addendum)
Thank you for coming to the office today. ? ?Lab today to check A1c, last time was in 7 range. Goal is < 8 ? ?Refilled Jardiance '10mg'$  ? ?Stay tuned for result. ? ?May check sugar 1 x daily, and repeat only if high or low. ? ?DUE for FASTING BLOOD WORK (no food or drink after midnight before the lab appointment, only water or coffee without cream/sugar on the morning of) ? ?SCHEDULE "Lab Only" visit in the morning at the clinic for lab draw in 4 MONTHS  ? ?- Make sure Lab Only appointment is at about 1 week before your next appointment, so that results will be available ? ?For Lab Results, once available within 2-3 days of blood draw, you can can log in to MyChart online to view your results and a brief explanation. Also, we can discuss results at next follow-up visit. ? ? ?Please schedule a Follow-up Appointment to: Return in about 4 months (around 11/16/2021) for 4 month fasting lab only then 1 week later Annual Physical. ? ?If you have any other questions or concerns, please feel free to call the office or send a message through [place]. You may also schedule an earlier appointment if necessary. ? ?Additionally, you may be receiving a survey about your experience at our office within a few days to 1 week by e-mail or mail. We value your feedback. ? ?[name], DO ?[place] ? ?Hyperglycemia ?Hyperglycemia occurs when the level of sugar (glucose) in the blood is too high. Glucose is a type of sugar that provides the body's main source of energy. Certain hormones (insulin and glucagon) control the level of glucose in the blood. Insulin lowers blood glucose, and glucagon increases blood glucose. Hyperglycemia can result from not having enough insulin in the bloodstream, or from the body not responding normally to insulin. ?Hyperglycemia occurs most often in people who have diabetes (diabetes mellitus), but it can happen in people who do not have diabetes. It can develop quickly, and  it can be life-threatening if it causes you to become severely dehydrated (diabetic ketoacidosis or hyperglycemic hyperosmolar state). Severe hyperglycemia is a medical emergency. ?For most people with diabetes, a blood glucose level above 240 mg/dL is considered hyperglycemia. ?What are the causes? ?If you have diabetes, hyperglycemia may be caused by: ?Medicines that increase blood glucose or affect your diabetes control. ?Getting less physical activity. ?Eating more than planned. ?Being sick or injured, having an infection, or having surgery. ?Stress. ?Not giving yourself enough insulin (if you are taking insulin). ?If you have undiagnosed diabetes, this may be the reason you have hyperglycemia. ?If you do not have diabetes, hyperglycemia may be caused by: ?Certain medicines, including: ?Steroid medicines. ?Beta-blockers. ?Epinephrine. ?Thiazide diuretics. ?Stress. ?Having a serious illness, an infection, or surgery. ?Diseases of the pancreas. ?What increases the risk? ?Hyperglycemia is more likely to develop in people who have risk factors for diabetes, such as: ?Having a family member with diabetes. ?Certain conditions in which the body's disease-fighting system (immune system) attacks itself (autoimmune disorders). ?Being overweight or obese. ?Having an inactive (sedentary) lifestyle. ?Having been diagnosed with insulin resistance. ?Having a history of prediabetes, gestational diabetes, or polycystic ovarian syndrome (PCOS). ?What are the signs or symptoms? ?Hyperglycemia may not cause any symptoms. If you do have symptoms, they may include: ?Increased thirst. ?Needing to urinate more often than usual. ?Hunger. ?Feeling very tired. ?Blurry vision. ?Other symptoms may develop if hyperglycemia gets worse, such as: ?Dry mouth. ?Abdominal pain. ?Loss  of appetite. ?Fruity-smelling breath. ?Weakness. ?Unexpected weight loss. ?Tingling or numbness in the hands or feet. ?Headache. ?Cuts or bruises that are slow to  heal. ?How is this diagnosed? ?Hyperglycemia is diagnosed with a blood test to measure your blood glucose level. This blood test is usually done while you are having symptoms. Your health care provider may also do a physical exam and review your medical history. ?You may have more tests to determine the cause of your hyperglycemia, such as: ?A fasting blood glucose (FBG) test. You will not be allowed to eat (you will fast) for at least 8 hours before a blood sample is taken. ?An A1C blood test. This provides information about blood glucose control over the previous 2-3 months. ?An oral glucose tolerance test (OGTT). This measures your blood glucose at two times: ?After fasting. This is your baseline blood glucose level. ?2 hours after drinking a beverage that contains glucose. ?How is this treated? ?Treatment depends on the cause of your hyperglycemia. Treatment may include: ?Taking medicine to regulate your blood glucose levels. If you take insulin or other diabetes medicines, your medicine or dosage may be adjusted. ?Lifestyle changes, such as exercising more, eating healthier foods, or losing weight. ?Treating an illness or infection. ?Checking your blood glucose more often. ?Stopping or reducing steroid medicines. ?If your hyperglycemia becomes severe and it results in diabetic ketoacidosis or hyperglycemic hyperosmolar state, you must be hospitalized and given IV fluids and IV insulin. ?Follow these instructions at home: ?General instructions ?Take over-the-counter and prescription medicines only as told by your health care provider. ?Do not use any products that contain nicotine or tobacco. These products include cigarettes, chewing tobacco, and vaping devices, such as e-cigarettes. If you need help quitting, ask your health care provider. ?If you drink alcohol: ?Limit how much you have to: ?0-1 drink a day for women who are not pregnant. ?0-2 drinks a day for men. ?Know how much alcohol is in a drink. In the  [place] one drink equals one 12 oz bottle of beer (355 mL), one 5 oz glass of wine (148 mL), or one 1? oz glass of hard liquor (44 mL). ?Learn to manage stress. If you need help with this, ask your health care provider. ?Do exercises as told by your health care provider. ?Keep all follow-up visits. This is important. ?Eating and drinking ? ?Maintain a healthy weight. ?Stay hydrated, especially when you exercise, get sick, or spend time in hot temperatures. ?Drink enough fluid to keep your urine pale yellow. ?If you have diabetes: ? ?Know the symptoms of hyperglycemia. ?Follow your diabetes management plan as told by your health care provider. Make sure you: ?Take your insulin and medicines as told. ?Follow your exercise plan. ?Follow your meal plan. Eat on time, and do not skip meals. ?Check your blood glucose as often as told. Make sure to check your blood glucose before and after exercise. If you exercise longer or in a different way, check your blood glucose more often. ?Follow your sick day plan whenever you cannot eat or drink normally. Make this plan in advance with your health care provider. ?Share your diabetes management plan with people in your workplace, school, and household. ?Check your urine for ketones when you are ill and as told by your health care provider. ?Carry a medical alert card or wear medical alert jewelry. ?Where to find more information ?American Diabetes Association: www.diabetes.org ?Contact a health care provider if: ?Your blood glucose is at or above 240 mg/dL (  13.3 mmol/L) for 2 days in a row. ?You have problems keeping your blood glucose in your target range. ?You have frequent episodes of hyperglycemia. ?You have signs of illness, such as nausea, vomiting, or fever. ?Get help right away if: ?Your blood glucose monitor reads "high" even when you are taking insulin. ?You have trouble breathing. ?You have a change in how you think, feel, or act (mental status). ?You have nausea or  vomiting that does not go away. ?These symptoms may represent a serious problem that is an emergency. Do not wait to see if the symptoms will go away. Get medical help right away. Call your local emergency s

## 2021-07-16 NOTE — Assessment & Plan Note (Addendum)
Controlled A1c - lab draw today ?lifestyle improving ?Complications - hypoglycemia (improved), CKD-III, peripheral neuropathy, cataracts, CAD ?Off Trulicity side effects GI ? ?Plan:  ?1. Discussed DM management concern with CKD and risk of hypoglycemia - limited med options due to CKD ?- Continue Jardiance '10mg'$  daily (reduced for GFR) ?- Continue reduced [name] 20u ?- Counseling on risk of hypoglycemia and symptoms ?2. Encourage improved lifestyle - keep improving diet - limit carbs, limit sweet tea, inc water - he has poor health literacy on diabetes ?

## 2021-07-16 NOTE — Progress Notes (Addendum)
? ?Subjective:  ? ? Patient ID:  Lucas Caldwell, male    DOB: 12-14-1933, 88 y.o.   MRN: [id number] ? ?Lucas Caldwell is a 88 y.o. male presenting on 07/16/2021 for Diabetes ? ? ?HPI ? ?CHRONIC DM, Type 2 / CKD-III Nephropathy and Neuropathy ?Partial Ray Amputation R Great Toe ? ?POC Glucose 225 today ?120-140, and occasional >200 ? ?[name] testing blood sugar twice per day ? ?Current medications ?[name] 20 units daily ?Jardiance '10mg'$  daily ? ?Off trulicity due to GI side effects diarrhea ?On med management for DM, improved sugars. ?Has diabetic shoes ?Followed by Podiatry currently, previously with Neurology in [place]. He requests another opinion from neurology on neuropathy ?On Gabapentin currently. Has failed lyrica in past due to side effect ?On Tramadol PRN ?Limited benefit from neuropathy. ? ? ?Bilateral Shoulder Pain ?History of Rotator Cuff Tendinopathy ?R>L ?History of 6-7 years ago approx has had lifting heavy injury and fall prior wear and tear on shoulders. He has done heavy lifting work for years. ?He has had issues with shoulder range of motion for several years now as well. ? ?Pursuing Physical therapy now - improving ? ? ? ?  04/13/2021  ? 10:57 AM 10/02/2020  ?  3:43 PM 07/28/2020  ?  1:57 PM  ?Depression screen PHQ 2/9  ?Decreased Interest 0 0 0  ?Down, Depressed, Hopeless 0 0 0  ?PHQ - 2 Score 0 0 0  ?Altered sleeping 2  2  ?Tired, decreased energy 0  0  ?Change in appetite 0  0  ?Feeling bad or failure about yourself  0  0  ?Trouble concentrating 0  0  ?Moving slowly or fidgety/restless 0  0  ?Suicidal thoughts 0  0  ?PHQ-9 Score 2  2  ?Difficult doing work/chores Not difficult at all    ? ? ?Social History  ? ?Tobacco Use  ? Smoking status: Former  ?  Packs/day: 1.75  ?  Years: 20.00  ?  Pack years: 35.00  ?  Types: Cigarettes  ?  Start date: 66  ?  Quit date: 22  ?  Years since quitting: 48.3  ? Smokeless tobacco: Former  ?Vaping Use  ? Vaping Use: Never used  ?Substance Use Topics  ?  Alcohol use: No  ?  Comment: previously drank heavily - quit 1979.  ? Drug use: No  ? ? ?Review of Systems ?Per HPI unless specifically indicated above ? ?   ?Objective:  ?  ?BP 138/72   Pulse 86   Ht '5\' 4"'$  (1.626 m)   Wt 196 lb 12.8 oz (89.3 kg)   SpO2 98%   BMI 33.78 kg/m?   ?Wt Readings from Last 3 Encounters:  ?07/16/21 196 lb 12.8 oz (89.3 kg)  ?07/06/21 194 lb (88 kg)  ?05/23/21 191 lb 12.8 oz (87 kg)  ?  ?Physical Exam ?Vitals and nursing note reviewed.  ?Constitutional:   ?   General: [name] not in acute distress. ?   Appearance: Normal appearance. [name] well-developed. [name] obese. [name] not diaphoretic.  ?   Comments: Well-appearing, comfortable, cooperative  ?HENT:  ?   Head: Normocephalic and atraumatic.  ?Eyes:  ?   General:     ?   Right eye: No discharge.     ?   Left eye: No discharge.  ?   Conjunctiva/sclera: Conjunctivae normal.  ?Cardiovascular:  ?   Rate and Rhythm: Normal rate.  ?Pulmonary:  ?   Effort: Pulmonary  effort is normal.  ?Skin: ?   General: Skin is warm and dry.  ?   Findings: No erythema or rash.  ?Neurological:  ?   Mental Status: [name] alert and oriented to person, place, and time.  ?Psychiatric:     ?   Mood and Affect: Mood normal.     ?   Behavior: Behavior normal.     ?   Thought Content: Thought content normal.  ?   Comments: Well groomed, good eye contact, normal speech and thoughts  ? ?Results for orders placed or performed during the hospital encounter of 07/06/21  ?Basic Metabolic Panel (BMET)  ?Result Value Ref Range  ? Sodium 137 135 - 145 mmol/L  ? Potassium 4.5 3.5 - 5.1 mmol/L  ? Chloride 105 98 - 111 mmol/L  ? CO2 22 22 - 32 mmol/L  ? Glucose, Bld 140 (H) 70 - 99 mg/dL  ? BUN 23 8 - 23 mg/dL  ? Creatinine, Ser 1.24 0.61 - 1.24 mg/dL  ? Calcium 9.2 8.9 - 10.3 mg/dL  ? GFR, Estimated 57 (L) >60 mL/min  ? Anion gap 10 5 - 15  ?Lipid Profile  ?Result Value Ref Range  ? Cholesterol 163 0 - 200 mg/dL  ? Triglycerides 186 (H) <150 mg/dL  ? HDL 37 (L) >40 mg/dL  ? Total  CHOL/HDL Ratio 4.4 RATIO  ? VLDL 37 0 - 40 mg/dL  ? LDL Cholesterol 89 0 - 99 mg/dL  ? ?*Note: Due to a large number of results and/or encounters for the requested time period, some results have not been displayed. A complete set of results can be found in Results Review.  ? ?   ?Assessment & Plan:  ? ?Problem List Items Addressed This Visit   ? ? Controlled type 2 diabetes mellitus with diabetic nephropathy, with long-term current use of insulin ([place]) - Primary  ?  Controlled A1c - lab draw today - result = 7.8 (07/16/21) ?lifestyle improving ?Complications - hypoglycemia (improved), CKD-III, peripheral neuropathy, cataracts, CAD ?Off Trulicity side effects GI ? ?Plan:  ?1. Discussed DM management concern with CKD and risk of hypoglycemia - limited med options due to CKD ?- Continue Jardiance '10mg'$  daily (reduced for GFR) ?- Continue reduced [name] 20u ? ?Recommend continue testing blood sugar up to twice per day. ? ?- Counseling on risk of hypoglycemia and symptoms ?2. Encourage improved lifestyle - keep improving diet - limit carbs, limit sweet tea, inc water - he has poor health literacy on diabetes ?  ?  ? Relevant Medications  ? JARDIANCE 10 MG TABS tablet  ? Other Relevant Orders  ? Hemoglobin A1c  ? Amputated great toe, right ([place])  ?  ? ?Recent Labs  ?  10/16/20 ?1512 02/21/21 ?1944 07/16/21 ?1420  ?HGBA1C 6.2* 7.3* 7.8*  ? ? ? ?Meds ordered this encounter  ?Medications  ? JARDIANCE 10 MG TABS tablet  ?  Sig: Take 1 tablet (10 mg total) by mouth daily with breakfast.  ?  Dispense:  90 tablet  ?  Refill:  1  ? ? ? ? ?Follow up plan: ?Return in about 4 months (around 11/16/2021) for 4 month fasting lab only then 1 week later Annual Physical. ? ?Future labs ordered for 11/2021 ? ? ?[name], DO ?[facility] ?[facility] ?07/16/2021, 2:04 PM ?

## 2021-07-17 LAB — HEMOGLOBIN A1C
Hgb A1c MFr Bld: 7.8 % of total Hgb — ABNORMAL HIGH (ref <5.7)
Mean Plasma Glucose: 177 mg/dL
eAG (mmol/L): 9.8 mmol/L

## 2021-07-17 NOTE — Telephone Encounter (Signed)
Patient seen with Dr End ?Diuretics addressed at office visit  ?Will awaiting next mems reading for further instructions  ?

## 2021-07-18 ENCOUNTER — Ambulatory Visit (INDEPENDENT_AMBULATORY_CARE_PROVIDER_SITE_OTHER): Payer: Medicare HMO

## 2021-07-18 DIAGNOSIS — Z5181 Encounter for therapeutic drug level monitoring: Secondary | ICD-10-CM | POA: Diagnosis not present

## 2021-07-18 DIAGNOSIS — I480 Paroxysmal atrial fibrillation: Secondary | ICD-10-CM | POA: Diagnosis not present

## 2021-07-18 DIAGNOSIS — Z7901 Long term (current) use of anticoagulants: Secondary | ICD-10-CM | POA: Diagnosis not present

## 2021-07-18 DIAGNOSIS — M12811 Other specific arthropathies, not elsewhere classified, right shoulder: Secondary | ICD-10-CM | POA: Diagnosis not present

## 2021-07-18 DIAGNOSIS — M75120 Complete rotator cuff tear or rupture of unspecified shoulder, not specified as traumatic: Secondary | ICD-10-CM | POA: Diagnosis not present

## 2021-07-18 DIAGNOSIS — M12812 Other specific arthropathies, not elsewhere classified, left shoulder: Secondary | ICD-10-CM | POA: Diagnosis not present

## 2021-07-18 LAB — POCT INR: INR: 2.6 (ref 2.0–3.0)

## 2021-07-18 NOTE — Patient Instructions (Signed)
-   continue dosage of warfarin of 1.5 TABLETS EVERY DAY EXCEPT 1 TABLETS ON MONDAYS & FRIDAYS. ?- recheck INR in 6 weeks ?

## 2021-07-23 ENCOUNTER — Telehealth: Payer: Medicare HMO

## 2021-07-23 ENCOUNTER — Ambulatory Visit (INDEPENDENT_AMBULATORY_CARE_PROVIDER_SITE_OTHER): Payer: Medicare HMO

## 2021-07-23 DIAGNOSIS — G8929 Other chronic pain: Secondary | ICD-10-CM

## 2021-07-23 DIAGNOSIS — I5032 Chronic diastolic (congestive) heart failure: Secondary | ICD-10-CM

## 2021-07-23 DIAGNOSIS — N1831 Chronic kidney disease, stage 3a: Secondary | ICD-10-CM

## 2021-07-23 DIAGNOSIS — I5023 Acute on chronic systolic (congestive) heart failure: Secondary | ICD-10-CM

## 2021-07-23 DIAGNOSIS — E782 Mixed hyperlipidemia: Secondary | ICD-10-CM

## 2021-07-23 DIAGNOSIS — I10 Essential (primary) hypertension: Secondary | ICD-10-CM

## 2021-07-23 DIAGNOSIS — E1121 Type 2 diabetes mellitus with diabetic nephropathy: Secondary | ICD-10-CM

## 2021-07-23 DIAGNOSIS — I25118 Atherosclerotic heart disease of native coronary artery with other forms of angina pectoris: Secondary | ICD-10-CM

## 2021-07-23 DIAGNOSIS — E11621 Type 2 diabetes mellitus with foot ulcer: Secondary | ICD-10-CM

## 2021-07-23 DIAGNOSIS — J432 Centrilobular emphysema: Secondary | ICD-10-CM

## 2021-07-23 DIAGNOSIS — E1169 Type 2 diabetes mellitus with other specified complication: Secondary | ICD-10-CM

## 2021-07-23 DIAGNOSIS — K219 Gastro-esophageal reflux disease without esophagitis: Secondary | ICD-10-CM

## 2021-07-23 NOTE — Chronic Care Management (AMB) (Signed)
?Chronic Care Management  ? ?CCM RN Visit Note ? ?07/23/2021 ?Name: Lucas Caldwell MRN: [id number] DOB: February 22, 1934 ? ?Subjective: ?Lucas Caldwell is a 88 y.o. year old male who is a primary care patient of [name], DO. The care management team was consulted for assistance with disease management and care coordination needs.   ? ?Engaged with patient by telephone for follow up visit in response to provider referral for case management and/or care coordination services.  ? ?Consent to Services:  ?The patient was given information about Chronic Care Management services, agreed to services, and gave verbal consent prior to initiation of services.  Please see initial visit note for detailed documentation.  ? ?Patient agreed to services and verbal consent obtained.  ? ?Assessment: Review of patient past medical history, allergies, medications, health status, including review of consultants reports, laboratory and other test data, was performed as part of comprehensive evaluation and provision of chronic care management services.  ? ?SDOH (Social Determinants of Health) assessments and interventions performed:   ? ?CCM Care Plan ? ?Allergies  ?Allergen Reactions  ? Clopidogrel Anaphylaxis and Other (See Comments)  ?  altered mental status; ? (electrolytes "out of wack" and confusion per family; THEY DO NOT [facility])- MSB 10/10/15  ? Ciprofloxacin Itching  ? Beta Adrenergic Blockers   ?  Nightmares  ? Mirtazapine Diarrhea  ?  Bad dreams ?  ? Spironolactone Other (See Comments)  ?  gynecomastia  ? ? ?Outpatient Encounter Medications as of 07/23/2021  ?Medication Sig  ? acetaminophen (TYLENOL) 500 MG tablet Take 1,000 mg by mouth every 6 (six) hours as needed for moderate pain or headache.   ? Alcohol Swabs (B-D SINGLE USE SWABS REGULAR) PADS Use to check blood sugar up to 2 times daily  ? allopurinol (ZYLOPRIM) 100 MG tablet Take 1 tablet (100 mg total) by mouth daily.  ? aspirin 81 MG  chewable tablet Chew 81 mg by mouth daily.  ? bosutinib (BOSULIF) 100 MG tablet Take 100 mg by mouth at bedtime. Take with food.  ? Cholecalciferol 25 MCG (1000 UT) capsule Take 1,000 Units by mouth daily.  ? famotidine (PEPCID) 40 MG tablet Take 1 tablet (40 mg total) by mouth 2 (two) times daily with a meal.  ? gabapentin (NEURONTIN) 300 MG capsule Take 1 capsule (300 mg total) by mouth 3 (three) times daily.  ? Insulin Glargine ([name] KWIKPEN) 100 UNIT/ML Inject 20 Units into the skin daily.  ? Insulin Pen Needle 31G X 5 MM MISC Use to inject insulin daily.  ? isosorbide mononitrate (IMDUR) 60 MG 24 hr tablet Take 0.5 tablets (30 mg total) by mouth daily.  ? JARDIANCE 10 MG TABS tablet Take 1 tablet (10 mg total) by mouth daily with breakfast.  ? Lancets (ONETOUCH DELICA PLUS [id number]) MISC Use to check blood sugar twice daily  ? levothyroxine (SYNTHROID) 25 MCG tablet TAKE ONE TABLET BY MOUTH EVERY MORNING BEFORE BREAKFAST  ? losartan (COZAAR) 25 MG tablet Take 0.5 tablets (12.5 mg total) by mouth daily.  ? Menthol-Zinc Oxide (GOLD BOND EX) Apply 1 application topically daily as needed (muscle pain).  ? Multiple Vitamin (MULTIVITAMIN WITH MINERALS) TABS tablet Take 1 tablet by mouth daily.  ? nitroGLYCERIN (NITROSTAT) 0.4 MG SL tablet Place 1 tablet (0.4 mg total) under the tongue every 5 (five) minutes x 3 doses as needed for chest pain.  ? ONETOUCH VERIO test strip Check blood sugar up to 2 x daily  ?  Polyethyl Glycol-Propyl Glycol (SYSTANE OP) Place 1 drop into both eyes 3 (three) times daily as needed (dry/irritated eyes.).   ? polyethylene glycol (MIRALAX / GLYCOLAX) 17 g packet Take 17 g by mouth daily as needed for mild constipation.   ? ranolazine (RANEXA) 500 MG 12 hr tablet TAKE ONE TABLET BY MOUTH TWICE DAILY  ? rosuvastatin (CRESTOR) 5 MG tablet Take 1 tablet (5 mg total) by mouth daily.  ? tamsulosin (FLOMAX) 0.4 MG CAPS capsule Take 1 capsule (0.4 mg total) by mouth daily after supper.  ?  torsemide (DEMADEX) 20 MG tablet Take 2 tablets (40 mg total) by mouth daily.  ? traMADol (ULTRAM) 50 MG tablet Take 1-2 tablets (50-100 mg total) by mouth at bedtime as needed.  ? warfarin (COUMADIN) 3 MG tablet TAKE 1 TO 1 & 1/2 TABLETS BY MOUTH AS DIRECTED BY THE COUMADIN CLINIC Strength: 3 mg  ? ?No facility-administered encounter medications on file as of 07/23/2021.  ? ? ?Patient Active Problem List  ? Diagnosis Date Noted  ? Amputated great toe, right ([place]) 03/05/2021  ? Acute hematogenous osteomyelitis ([place])   ? Cellulitis of foot   ? Diabetic foot infection ([place])   ? Diabetic foot ulcer ([place]) 02/21/2021  ? Diabetic polyneuropathy associated with type 2 diabetes mellitus ([place]) 11/23/2020  ? Orthostatic hypotension 10/16/2020  ? Obstructive sleep apnea 06/29/2020  ? Urge incontinence of urine 12/22/2019  ? Trifascicular block 10/20/2019  ? Wound infection 10/08/2019  ? Bifascicular block 05/13/2019  ? Coronary stent patent 04/13/2019  ? PSVT (paroxysmal supraventricular tachycardia) ([place]) 03/19/2019  ? 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 ([place]) 08/24/2018  ? CKD (chronic kidney disease), stage IV ([place]) 06/04/2018  ? Osteoarthritis of knees, bilateral 06/02/2018  ? Hyperlipidemia associated with type 2 diabetes mellitus ([name]) 05/06/2018  ? Pain of lower extremity 03/06/2018  ? Duodenitis 01/30/2018  ? Hematochezia   ? Melena   ? Rectal bleeding 01/11/2018  ? AVM (arteriovenous malformation) of colon without hemorrhage   ? Schatzki's ring   ? Esophageal dysphagia   ? Diabetic retinopathy ([place]) 07/08/2017  ? Cardiomyopathy ([place]) 06/26/2017  ? Centrilobular emphysema ([place]) 06/13/2017  ? Fatigue 12/18/2016  ? Stable angina ([place]) 09/20/2016  ? Stage 3a chronic kidney disease ([place]) 09/20/2016  ? Paroxysmal atrial fibrillation ([place]) 08/28/2016  ? Chronic anticoagulation 08/28/2016  ? Essential hypertension 08/20/2016  ? 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 heart with stable angina pectoris  ([place]) 08/19/2016  ? Exertional dyspnea 08/12/2016  ? 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 Osteomyelitis of great toe of right foot ([place]) 10/03/2015  ? Ulcerated, foot, right, with necrosis of bone ([place]) 09/26/2015  ? Gynecomastia, male 03/31/2014  ? Hypertrophy of breast 03/31/2014  ? Nuclear sclerosis of left eye 10/22/2013  ? Enthesopathy of ankle and tarsus 04/10/2013  ? Controlled type 2 diabetes mellitus with diabetic nephropathy, with long-term current use of insulin ([place]) 04/09/2013  ? Diabetic neuropathy associated with type 2 diabetes mellitus ([place]) 04/09/2013  ? Cataract of left eye 11/25/2011  ? Corneal opacity 09/30/2011  ? Pseudophakia of right eye 09/30/2011  ? GERD (gastroesophageal reflux disease) 09/25/2011  ? Gout 09/25/2011  ? Hyperlipidemia LDL goal <70 09/25/2011  ? Status post cataract extraction 04/11/2011  ? Cataract extraction status of eye 04/11/2011  ? Iron deficiency anemia 04/01/2011  ? Benign localized hyperplasia of prostate without urinary obstruction and other lower urinary tract symptoms (LUTS) 12/10/2010  ? Enlarged prostate without lower  urinary tract symptoms (luts) 12/10/2010  ? Colon cancer screening 10/11/2010  ? Hypothyroidism 10/11/2010  ? Hyperlipidemia 09/20/2003  ? Essential and other specified forms of tremor 01/05/2001  ? Essential tremor 01/05/2001  ? ? ?Conditions to be addressed/monitored:CHF, CAD, HTN, HLD, COPD, CKD Stage 3, GERD, and Chronic pain ? ?Care Plan : RNCM: General Plan of Care (Adult) for Chronic Disease Management and Care Cooerdination Needs  ?Updates made by Vanita Ingles, RN since 07/23/2021 12:00 AM  ?  ? ?Problem: RNCM: Development of Plan of Care for Chronic Disease Management (HF, CAD, HTN,HLD, DM, COPD, CKD3, Chronic Pain)   ?Priority: High  ?  ? ?Long-Range Goal: RNCM: Effective Management  of Plan of Care for Chronic Disease Management (HF, CAD, HTN,HLD, DM, COPD, CKD3, Chronic Pain)   ?Start Date: 01/22/2021  ?Expected End Date: 01/22/2022   ?Priority: High  ?Note:   ?Current Barriers:  ?Knowledge Deficits related to plan of care for management of CHF, CAD, HTN, HLD, COPD, DMII, CKD Stage 3, and chronic pain  ?Care Coordination needs related to [facility]

## 2021-07-23 NOTE — Patient Instructions (Signed)
Visit Information ? ?Thank you for taking time to visit with me today. Please don't hesitate to contact me if I can be of assistance to you before our next scheduled telephone appointment. ? ?Following are the goals we discussed today:  ?RNCM Clinical Goal(s):  ?Patient will verbalize understanding of plan for management of CHF, CAD, HTN, HLD, COPD, DMII, CKD Stage 3, and Osteoarthritis as evidenced by understanding the plan of care, calling the provider for changes, and working with the CCM to optimize health and well being for effective management of chronic conditions ?take all medications exactly as prescribed and will call provider for medication related questions as evidenced by taking medications as prescribed, contacting providers for questions about medications, and working with the pharm D to manage polypharmacy and medications concerns     ?attend all scheduled medical appointments:  06-21-2021: The patient has had several appointments since last outreach. The patient saw the pcp 07-16-2021, as evidenced  by keeping appointments with pcp and other specialist and calling for new appointments needed or questions        ?demonstrate improved and ongoing adherence to prescribed treatment plan for CHF, CAD, HTN, HLD, COPD, DMII, CKD Stage 3, and Osteoarthritis as evidenced by working with the CCM team to optimize health and well being  ?demonstrate a decrease in CHF, CAD, HTN, HLD, COPD, DMII, CKD Stage 3, and Osteoarthritis exacerbations  as evidenced by calling the provider for changes in conditions, stable chronic conditions and working with the CCM team to effectively manage health and well being  ?demonstrate ongoing self health care management ability for effective management of chronic conditions  as evidenced by  working with the CCM team  through collaboration with Consulting civil engineer, provider, and care team.  ?  ?Interventions: ?1:1 collaboration with primary care provider regarding development and update  of comprehensive plan of care as evidenced by provider attestation and co-signature ?Inter-disciplinary care team collaboration (see longitudinal plan of care) ?Evaluation of current treatment plan related to  self management and patient's adherence to plan as established by provider ?  ?  ?SDOH Barriers (Status: Goal on Track (progressing): YES.) Long Term Goal  ?Patient interviewed and SDOH assessment performed ?       ?Patient interviewed and appropriate assessments performed ?Provided patient with information about resources in [facility] and care guides available for assistance with changes in SDOH, questions or concerns ?Discussed plans with patient for ongoing care management follow up and provided patient with direct contact information for care management team ?Advised patient to call the office for changes in SDOH, new concerns or questions  ?Provided education to patient/caregiver regarding level of care options. ?Care guide referral for expressed need of someone to install rails or a sliding door with rails to the bathroom. Instructed the patients wife that the care guide team would reach out to her concerning resources in [place]. (04-19-2021) ?  ?  ?  ?CAD  (Status: Goal on Track (progressing): YES.) Long Term Goal  ?   ?BP Readings from Last 3 Encounters:  ?07/16/21 138/72  ?07/06/21 120/62  ?05/23/21 (!) 148/81  ?  ?     ?Lab Results  ?Component Value Date  ?  CHOL 163 07/06/2021  ?  HDL 37 (L) 07/06/2021  ?  [place] 89 07/06/2021  ?  TRIG 186 (H) 07/06/2021  ?  CHOLHDL 4.4 07/06/2021  ?  ?Assessed understanding of CAD diagnosis. 07-23-2021: The patient and his wife are compliant with a plan  of care. They see the cardiologist on a consistent basis.  ?Medications reviewed including medications utilized in CAD treatment plan. 07-23-2021: The patient is compliant with medications.  ?Provided education on importance of blood pressure control in management of CAD; ?Provided education on Importance  of limiting foods high in cholesterol. 04-19-2021: Review of dietary intake and patient is compliant with heart healthy/ADA diet. Education on foods to avoid since the patient is having diarrhea off and on. The patients wife is very attentive to the needs of the patient and watches what he eats and does. 06-21-2021: The patients wife states the patients diarrhea is much better now. She says that he is eating good. [name] stable and no new concerns. 07-23-2021: The patient is compliant with dietary restrictions.  ?Counseled on importance of regular laboratory monitoring as prescribed. 07-23-2021: Has blood work on a regular basis; ?Reviewed Importance of taking all medications as prescribed. 07-23-2021: Review of medications and patient is compliant with medications. Denies any medication needs or changes in cardiac medications. Is working with the pharm D on a regular basis.  ?Reviewed Importance of attending all scheduled provider appointments.  Keeps appointments, calls for changes and needs ?Advised to report any changes in symptoms or exercise tolerance ?Screening for signs and symptoms of depression related to chronic disease state;  ?Assessed social determinant of health barriers;  ?  ?Heart Failure Interventions:  (Status: Goal on Track (progressing): YES.)  Long Term Goal  ?Basic overview and discussion of pathophysiology of Heart Failure reviewed; ?Provided education on low sodium diet. 03-21-2021: The patient is compliant with heart healthy/ADA diet. Is not feeling the best today because of some diarrhea but wife states he is eating and drinking well. 07-23-2021: Review of heart healthy/ADA diet with the patients wife. The patient is having a good day today. Denies any fluid overload ?Reviewed Heart Failure Action Plan in depth and provided written copy; ?Assessed need for readable accurate scales in home. 06-21-2021: Participates in the HF program and weighs daily on a provided mat that send readings into the HF clinic  for review and recommendations is any. Per the patients wife he is doing well and has not had an exacerbations in his HF. Reports weight daily.  ?Provided education about placing scale on hard, flat surface; ?Advised patient to weigh each morning after emptying bladder; ?Discussed importance of daily weight and advised patient to weigh and record daily; ?Reviewed role of diuretics in prevention of fluid overload and management of heart failure. 07-23-2021: Is compliant with the plan of care for diuretics. Actively participates in HF clinic and reporting weights daily; ?Discussed the importance of keeping all appointments with provider. Has appointments coming up with specialist and calls pcp when needed.  ?Provided patient with education about the role of exercise in the management of heart failure; ?Advised patient to discuss torsemide dose  with provider.  01-22-2021: The patients wife states that she has not heard back from the heart failure clinic on the Torsemide dose. Advised the patients wife to call the HF clinic back and request clarification on Torsemide dose. The patients wife verbalized understanding. She states he is not having any issues with swelling but he is having a problem with "holding his water" and its been going on for a while. 01-24-2021: The pharm D is also assisting with getting in touch with the cardiologist/HF clinic to get clarification of medication dosage. 07-23-2021: The patient is taking medications as directed. Denies any issues with medication compliance.  ?EF from  August 2022 is 30 to 35% ?  ?Chronic Kidney Disease (Status: Goal on Track (progressing): YES.)  Long Term Goal  ?Last practice recorded BP readings:  ?   ?BP Readings from Last 3 Encounters:  ?07/16/21 138/72  ?07/06/21 120/62  ?05/23/21 (!) 148/81  ?Most recent eGFR/CrCl:  ?     ?Lab Results  ?Component Value Date  ?  EGFR 55 (L) 05/16/2021  ?  No components found for: CRCL ?  ?Assessed the patient   and patients wife for    understanding of chronic kidney disease    ?Evaluation of current treatment plan related to chronic kidney disease self management and patient's adherence to plan as established by provider . 01-22-2021:

## 2021-07-27 ENCOUNTER — Other Ambulatory Visit: Payer: Self-pay | Admitting: Internal Medicine

## 2021-07-29 DIAGNOSIS — G4733 Obstructive sleep apnea (adult) (pediatric): Secondary | ICD-10-CM | POA: Diagnosis not present

## 2021-08-01 ENCOUNTER — Encounter (HOSPITAL_COMMUNITY): Payer: Self-pay | Admitting: Adult Health

## 2021-08-01 ENCOUNTER — Ambulatory Visit (INDEPENDENT_AMBULATORY_CARE_PROVIDER_SITE_OTHER): Payer: Medicare HMO | Admitting: Nurse Practitioner

## 2021-08-01 ENCOUNTER — Encounter: Payer: Self-pay | Admitting: Nurse Practitioner

## 2021-08-01 VITALS — BP 112/60 | HR 96 | Ht 63.0 in | Wt 199.0 lb

## 2021-08-01 DIAGNOSIS — E785 Hyperlipidemia, unspecified: Secondary | ICD-10-CM

## 2021-08-01 DIAGNOSIS — I10 Essential (primary) hypertension: Secondary | ICD-10-CM

## 2021-08-01 DIAGNOSIS — I480 Paroxysmal atrial fibrillation: Secondary | ICD-10-CM

## 2021-08-01 DIAGNOSIS — I5023 Acute on chronic systolic (congestive) heart failure: Secondary | ICD-10-CM

## 2021-08-01 DIAGNOSIS — N183 Chronic kidney disease, stage 3 unspecified: Secondary | ICD-10-CM

## 2021-08-01 DIAGNOSIS — I2510 Atherosclerotic heart disease of native coronary artery without angina pectoris: Secondary | ICD-10-CM | POA: Diagnosis not present

## 2021-08-01 MED ORDER — ALLOPURINOL 100 MG PO TABS: 100.0000 mg | ORAL_TABLET | Freq: Every day | ORAL | 3 refills | Status: DC

## 2021-08-01 MED ORDER — TORSEMIDE 20 MG PO TABS: 20.0000 mg | ORAL_TABLET | ORAL | 3 refills | Status: DC

## 2021-08-01 NOTE — Patient Instructions (Addendum)
Medication Instructions:  ?Your physician has recommended you make the following change in your medication:  ? ?TAKE Torsemide 20 mg take 2 tablets twice a day for 3 days THEN take Torsemide 20 mg 2 tablets in AM and 1 tablet in PM. ? ?*If you need a refill on your cardiac medications before your next appointment, please call your pharmacy* ? ? ?Lab Work: ?None ? ?If you have labs (blood work) drawn today and your tests are completely normal, you will receive your results only by: ?MyChart Message (if you have MyChart) OR ?A paper copy in the mail ?If you have any lab test that is abnormal or we need to change your treatment, we will call you to review the results. ? ? ?Testing/Procedures: ?None ? ? ?Follow-Up: ?At [facility], you and your health needs are our priority.  As part of our continuing mission to provide you with exceptional heart care, we have created designated Provider Care Teams.  These Care Teams include your primary Cardiologist (physician) and Advanced Practice Providers (APPs -  Physician Assistants and Nurse Practitioners) who all work together to provide you with the care you need, when you need it. ? ?We recommend signing up for the patient portal called "MyChart".  Sign up information is provided on this After Visit Summary.  MyChart is used to connect with patients for Virtual Visits (Telemedicine).  Patients are able to view lab/test results, encounter notes, upcoming appointments, etc.  Non-urgent messages can be sent to your provider as well.   ?To learn more about what you can do with MyChart, go to [email or web address].   ? ?Your next appointment:   ?1 week(s) ? ?APPOINTMENT SCHEDULED FOR 08/09/21 at 2:45 pm.  ? ?The format for your next appointment:   ?In Person ? ?Provider:   ?[name], NP  ? ? ?Important Information About Sugar ? ? ? ? ?  ?

## 2021-08-01 NOTE — Progress Notes (Signed)
?  Cardiomems Goal 15. Todays reading 19  ? ?Suggestive of fluid accumulations.  ? ?Will need to increase torsemide to 40 mg twice a day. ? ?I sent message to [name] NP. [name] adjusting office visit.  ? ? ?Jeanet Lupe NP-C  ?2:04 PM ? ? ?

## 2021-08-01 NOTE — Progress Notes (Signed)
? ? ?Office Visit  ?  ?Patient Name: Lucas Caldwell ?Date of Encounter: 08/01/2021 ? ?Primary Care Provider:  [name], DO ?Primary Cardiologist:  [name], MD ? ?Chief Complaint  ?  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 ? ?Past Medical History  ?  ?Past Medical History:  ?Diagnosis Date  ? Arthritis   ? "probably in his legs" (08/15/2016)  ? Blind right eye   ? BPH (benign prostatic hyperplasia)   ? Breast asymmetry   ? Left breast is larger, present for several years.  ? CAD (coronary artery disease)   ? a. Cath in the late 90's - reportedly ok;  b. 2014 s/p stenting x 2 @ [facility]; c 08/19/16 Cath/PCI with DES -> RCA, plan to treat LM 70% medically. Seen by surgery and felt to be too high risk for CABG; d. 08/2018 Cath: LM 70 (iFR 0.86), LAD 20ost, 40p, 50/32m D1 70, LCX patent stent, OM1 30, RCA patent stent, 410mSR, 40d, RPDA 30. PCWP 8. CO/CI 4.0/2.1; e. 03/2019 PCI to LAD (2.5x15 Resolute Onyx DES).  ? Chronic combined systolic (congestive) and diastolic (congestive) heart failure (HCC)   ? a. Previously reduced EF-->50% by echo in 2012;  b. 06/2015 Echo: EF 50-55%  c. 07/2016 Echo: EF 45-50%; d. 12/2017 Echo: EF 55%; e. 08/2018 Echo: EF 35-40%; f. 04/2019 Echo: EF 30-35%; g. s/p cardiomems; h. 10/2020 Echo: EF 30-35%, mod-sev glob HK, gr1 DD.  ? Chronic kidney disease (CKD), stage IV (severe) (HCC)   ? CML (chronic myelocytic leukemia) ([place]  ? GERD (gastroesophageal reflux disease)   ? GIB (gastrointestinal bleeding)   ? a. 12/2017 3 unit PRBC GIB in  setting of coumadin-->Endo/colon multiple duodenal ulcers and a single bleeding ulcer in the proximal ascending colon status post hemostatic clipping x2.  ? Gout   ? Hyperlipemia   ? Hypertension   ? Hypothyroidism   ? Iron deficiency anemia   ? Ischemic cardiomyopathy   ? a. Previously reduced EF-->50% by echo in 2012;  b. 06/2015 Echo: EF 50-55%, Gr2 DD;  c. 07/2016 Echo: EF 45-50%; d. 12/2017 Echo: EF 55%, Gr1 DD, mild MR; e. 08/2018 Echo: EF 35-40%; f. 04/2019 Echo: EF 30-35%; g. 10/2020 Echo: EF 30-35%.  ? Migraine   ? "in the 1960s" (08/15/2016)  ? Obstructive sleep apnea   ? Orthostatic hypotension   ? a. 20222 -->carvedilol and hydralazine d/c'd.  ? PAF (paroxysmal atrial fibrillation) (HCNorth [place]  ? a. ? Dx 2014-->s/p DCCV;  b. CHA2DS2VASc = 6--> Coumadin.  ? Prostate cancer (HCFrank[place]  ? RBBB   ? Type II diabetes mellitus (HCK[place]  ? ?Past Surgical History:  ?Procedure Laterality Date  ? AMPUTATION TOE Right 02/23/2021  ? Procedure: AMPUTATION TOE;  Surgeon: FoSamara DeistDPM;  Location: [facility] ORS;  Service: Podiatry;  Laterality: Right;  ? CATARACT EXTRACTION W/ INTRAOCULAR LENS  IMPLANT, BILATERAL Bilateral   ? COLONOSCOPY N/A 01/13/2018  ? Procedure: COLONOSCOPY;  Surgeon: VaLin LandsmanMD;  [facility];  Service: Gastroenterology;  Laterality: N/A;  ? COLONOSCOPY WITH PROPOFOL N/A 01/01/2018  ? Procedure: COLONOSCOPY WITH PROPOFOL;  Surgeon:  Lin Landsman, MD;  Location: [facility] ENDOSCOPY;  Service: Gastroenterology;  Laterality: N/A;  ? CORONARY ANGIOPLASTY WITH STENT PLACEMENT  09/2012  ? 2 stents  ? CORONARY STENT INTERVENTION N/A 08/19/2016  ? Procedure: Coronary Stent Intervention;  Surgeon: [name], MD;  Location: [place] CV LAB;  Service: Cardiovascular;  Laterality: N/A;  ? CORONARY STENT INTERVENTION N/A 04/13/2019  ? Procedure: CORONARY STENT INTERVENTION;  Surgeon: [name], MD;  Location: [place] CV LAB;  Service: Cardiovascular;  Laterality: N/A;  ? ESOPHAGEAL  MANOMETRY N/A 12/16/2017  ? Procedure: ESOPHAGEAL MANOMETRY (EM);  Surgeon: Lin Landsman, MD;  Location: [facility] ENDOSCOPY;  Service: Gastroenterology;  Laterality: N/A;  ? ESOPHAGOGASTRODUODENOSCOPY N/A 12/20/2016  ? Procedure: ESOPHAGOGASTRODUODENOSCOPY (EGD);  Surgeon: Lin Landsman, MD;  Location: [facility] ENDOSCOPY;  Service: Gastroenterology;  Laterality: N/A;  ? ESOPHAGOGASTRODUODENOSCOPY N/A 01/13/2018  ? Procedure: ESOPHAGOGASTRODUODENOSCOPY (EGD);  Surgeon: Lin Landsman, MD;  Location: [facility] ENDOSCOPY;  Service: Gastroenterology;  Laterality: N/A;  ? ESOPHAGOGASTRODUODENOSCOPY (EGD) WITH PROPOFOL N/A 11/03/2017  ? Procedure: ESOPHAGOGASTRODUODENOSCOPY (EGD) WITH PROPOFOL;  Surgeon: Lin Landsman, MD;  [facility];  Service: Gastroenterology;  Laterality: N/A;  ? EYE SURGERY    ? HERNIA REPAIR    ? "navel"  ? LEFT HEART CATH AND CORONARY ANGIOGRAPHY N/A 08/14/2016  ? Procedure: Left Heart Cath and Coronary Angiography;  Surgeon: [name], MD;  Location: [place] CV LAB;  Service: Cardiovascular;  Laterality: N/A;  ? PRESSURE SENSOR/CARDIOMEMS N/A 09/23/2019  ? Procedure: PRESSURE SENSOR/CARDIOMEMS;  Surgeon: Jolaine Artist, MD;  Location: [place] CV LAB;  Service: Cardiovascular;  Laterality: N/A;  ? RIGHT HEART CATH N/A 09/23/2019  ? Procedure: RIGHT HEART CATH;  Surgeon: Jolaine Artist, MD;  Location: [place] CV LAB;  Service: Cardiovascular;  Laterality: N/A;  ? RIGHT/LEFT HEART CATH AND CORONARY ANGIOGRAPHY N/A 09/01/2018  ? Procedure: RIGHT/LEFT HEART CATH AND CORONARY ANGIOGRAPHY;  Surgeon: [name], MD;  Location: [place] CV LAB;  Service: Cardiovascular;  Laterality: N/A;  ? RIGHT/LEFT HEART CATH AND CORONARY ANGIOGRAPHY N/A 04/13/2019  ? Procedure: RIGHT/LEFT HEART CATH AND CORONARY ANGIOGRAPHY;  Surgeon: [name], MD;  Location: [place] CV LAB;  Service: Cardiovascular;  Laterality: N/A;  ? TOE AMPUTATION Right   ?  "big toe"  ? ? ?Allergies ? ?Allergies  ?Allergen Reactions  ? Clopidogrel Anaphylaxis and Other (See Comments)  ?  altered mental status; ? (electrolytes "out of wack" and confusion per family; THEY DO NOT [place])- MSB 10/10/15  ? Ciprofloxacin Itching  ? Beta Adrenergic Blockers   ?  Nightmares  ? Mirtazapine Diarrhea  ?  Bad dreams ?  ? Spironolactone Other (See Comments)  ?  gynecomastia  ? ? ?History of Present Illness  ?  ?88 year old ? with a history of CAD status post prior RCA, circumflex, and LAD stents, ischemic cardiomyopathy, chronic combined systolic and diastolic congestive heart failure, hypertension, hyperlipidemia, diabetes, paroxysmal atrial fibrillation (on warfarin), stage III-IV chronic kidney disease, CML, iron deficiency anemia, GI bleed due to duodenal ulcers (October 2019), BPH, hypothyroidism, and peripheral neuropathy with unsteady gait.  He previously underwent stenting of the right coronary artery in June 2018 was noted to have residual 70% left main disease.  He was evaluated by CT surgery at that time and felt not to be a suitable surgical candidate.  Due to ongoing symptoms, he underwent PCI of the LAD and January 2021.  EF has been variable over the years with  a high of 55% by echocardiogram in 2019 with subsequent decline in EF to 30-35% by echo in February 2021.  In July 2021, he underwent CardioMEMS placement and this has been followed by our advanced heart failure team in [place] since.  Beta-blocker therapy was previously discontinued secondary to nightmares (both metoprolol and carvedilol).  Echocardiogram in August 2022 showed persistent LV dysfunction with an EF of 30 to 35%.  Due to ongoing orthostasis, hydralazine was discontinued in August 2022, and losartan was cut in half from 25 to 12.5 mg daily, in late August.  Mr. Bahri has since continued to have intermittent orthostatic lightheadedness. ? ?Mr. Makarewicz was last seen on April 21 for urgent  evaluation for 2-week history of dyspnea, bendopnea, and abdominal distention.  It was noted that the advanced heart failure clinic tried to reach out to him secondary to a PA diastolic reading of 20 mm

## 2021-08-02 NOTE — Addendum Note (Signed)
Addended by: Janan Ridge on: 08/02/2021 11:50 AM   Modules accepted: Orders

## 2021-08-03 DIAGNOSIS — B351 Tinea unguium: Secondary | ICD-10-CM | POA: Diagnosis not present

## 2021-08-03 DIAGNOSIS — M25511 Pain in right shoulder: Secondary | ICD-10-CM | POA: Diagnosis not present

## 2021-08-03 DIAGNOSIS — E1142 Type 2 diabetes mellitus with diabetic polyneuropathy: Secondary | ICD-10-CM | POA: Diagnosis not present

## 2021-08-03 DIAGNOSIS — M25512 Pain in left shoulder: Secondary | ICD-10-CM | POA: Diagnosis not present

## 2021-08-08 DIAGNOSIS — M25512 Pain in left shoulder: Secondary | ICD-10-CM | POA: Diagnosis not present

## 2021-08-08 DIAGNOSIS — M25511 Pain in right shoulder: Secondary | ICD-10-CM | POA: Diagnosis not present

## 2021-08-09 ENCOUNTER — Other Ambulatory Visit
Admission: RE | Admit: 2021-08-09 | Discharge: 2021-08-09 | Disposition: A | Discharge status: Home / Self Care | Payer: Medicare HMO | Source: Ambulatory Visit | Attending: Nurse Practitioner | Admitting: Nurse Practitioner

## 2021-08-09 ENCOUNTER — Other Ambulatory Visit: Payer: Self-pay | Admitting: Family Medicine

## 2021-08-09 ENCOUNTER — Ambulatory Visit (INDEPENDENT_AMBULATORY_CARE_PROVIDER_SITE_OTHER): Payer: Medicare HMO | Admitting: Nurse Practitioner

## 2021-08-09 ENCOUNTER — Encounter: Payer: Self-pay | Admitting: Nurse Practitioner

## 2021-08-09 VITALS — BP 130/80 | HR 93 | Ht 63.0 in | Wt 198.0 lb

## 2021-08-09 DIAGNOSIS — I5022 Chronic systolic (congestive) heart failure: Secondary | ICD-10-CM

## 2021-08-09 DIAGNOSIS — I255 Ischemic cardiomyopathy: Secondary | ICD-10-CM

## 2021-08-09 DIAGNOSIS — I2510 Atherosclerotic heart disease of native coronary artery without angina pectoris: Secondary | ICD-10-CM

## 2021-08-09 DIAGNOSIS — E039 Hypothyroidism, unspecified: Secondary | ICD-10-CM

## 2021-08-09 DIAGNOSIS — I10 Essential (primary) hypertension: Secondary | ICD-10-CM | POA: Diagnosis not present

## 2021-08-09 DIAGNOSIS — N184 Chronic kidney disease, stage 4 (severe): Secondary | ICD-10-CM

## 2021-08-09 DIAGNOSIS — I429 Cardiomyopathy, unspecified: Secondary | ICD-10-CM | POA: Diagnosis not present

## 2021-08-09 DIAGNOSIS — I480 Paroxysmal atrial fibrillation: Secondary | ICD-10-CM | POA: Diagnosis not present

## 2021-08-09 LAB — BASIC METABOLIC PANEL
Anion gap: 9 (ref 5–15)
BUN: 33 mg/dL — ABNORMAL HIGH (ref 8–23)
CO2: 26 mmol/L (ref 22–32)
Calcium: 9.1 mg/dL (ref 8.9–10.3)
Chloride: 96 mmol/L — ABNORMAL LOW (ref 98–111)
Creatinine, Ser: 1.7 mg/dL — ABNORMAL HIGH (ref 0.61–1.24)
GFR, Estimated: 39 mL/min — ABNORMAL LOW (ref >60)
Glucose, Bld: 319 mg/dL — ABNORMAL HIGH (ref 70–99)
Potassium: 4.3 mmol/L (ref 3.5–5.1)
Sodium: 131 mmol/L — ABNORMAL LOW (ref 135–145)

## 2021-08-09 NOTE — Progress Notes (Signed)
Office Visit    Patient Name: Lucas Caldwell Date of Encounter: 08/09/2021  Primary Care Provider:  [name], DO Primary Cardiologist:  [name], MD  Chief Complaint    88 y/o ? with a history of CAD status post prior RCA, circumflex, and LAD stenting, ischemic cardiomyopathy, chronic combined systolic and diastolic congestive heart failure, hypertension, hyperlipidemia, diabetes, paroxysmal atrial fibrillation (on warfarin), stage III-IV chronic kidney disease, CML, iron deficiency anemia, GI bleed due to duodenal ulcers (12/2017), BPH, hypothyroidism, and peripheral neuropathy with unsteady gait, who presents for follow-up of heart failure.  Past Medical History    Past Medical History:  Diagnosis Date   Arthritis    "probably in his legs" (08/15/2016)   Blind right eye    BPH (benign prostatic hyperplasia)    Breast asymmetry    Left breast is larger, present for several years.   CAD (coronary artery disease)    a. Cath in the late 90's - reportedly ok;  b. 2014 s/p stenting x 2 @ [facility]; c 08/19/16 Cath/PCI with DES -> RCA, plan to treat LM 70% medically. Seen by surgery and felt to be too high risk for CABG; d. 08/2018 Cath: LM 70 (iFR 0.86), LAD 20ost, 40p, 50/42m D1 70, LCX patent stent, OM1 30, RCA patent stent, 430mSR, 40d, RPDA 30. PCWP 8. CO/CI 4.0/2.1; e. 03/2019 PCI to LAD (2.5x15 Resolute Onyx DES).   Chronic combined systolic (congestive) and diastolic (congestive) heart failure ([place]   a. Previously reduced EF-->50% by echo in 2012;  b. 06/2015 Echo: EF 50-55%  c. 07/2016 Echo: EF 45-50%; d. 12/2017 Echo: EF 55%; e. 08/2018 Echo: EF 35-40%; f. 04/2019 Echo: EF 30-35%; g. s/p cardiomems; h. 10/2020 Echo: EF 30-35%, mod-sev glob HK, gr1 DD.   Chronic kidney disease (CKD), stage IV (severe) (HCC)    CML (chronic myelocytic leukemia) (HCC)    GERD (gastroesophageal reflux disease)    GIB (gastrointestinal bleeding)    a. 12/2017 3 unit PRBC GIB in  setting of coumadin-->Endo/colon multiple duodenal ulcers and a single bleeding ulcer in the proximal ascending colon status post hemostatic clipping x2.   Gout    Hyperlipemia    Hypertension    Hypothyroidism    Iron deficiency anemia    Ischemic cardiomyopathy    a. Previously reduced EF-->50% by echo in 2012;  b. 06/2015 Echo: EF 50-55%, Gr2 DD;  c. 07/2016 Echo: EF 45-50%; d. 12/2017 Echo: EF 55%, Gr1 DD, mild MR; e. 08/2018 Echo: EF 35-40%; f. 04/2019 Echo: EF 30-35%; g. 10/2020 Echo: EF 30-35%.   Migraine    "in the 1960s" (08/15/2016)   Obstructive sleep apnea    Orthostatic hypotension    a. 20222 -->carvedilol and hydralazine d/c'd.   PAF (paroxysmal atrial fibrillation) (HCC)    a. ? Dx 2014-->s/p DCCV;  b. CHA2DS2VASc = 6--> Coumadin.   Prostate cancer ([place]   RBBB    Type II diabetes mellitus ([facility]   Past Surgical History:  Procedure Laterality Date   AMPUTATION TOE Right 02/23/2021   Procedure: AMPUTATION TOE;  Surgeon: FoSamara DeistDPM;  Location: [facility] ORS;  Service: Podiatry;  Laterality: Right;   CATARACT EXTRACTION W/ INTRAOCULAR LENS  IMPLANT, BILATERAL Bilateral    COLONOSCOPY N/A 01/13/2018   Procedure: COLONOSCOPY;  Surgeon: VaLin LandsmanMD;  [facility];  Service: Gastroenterology;  Laterality: N/A;   COLONOSCOPY WITH PROPOFOL N/A 01/01/2018   Procedure: COLONOSCOPY WITH PROPOFOL;  Surgeon: Lin Landsman, MD;  Location: [facility] ENDOSCOPY;  Service: Gastroenterology;  Laterality: N/A;   CORONARY ANGIOPLASTY WITH STENT PLACEMENT  09/2012   2 stents   CORONARY STENT INTERVENTION N/A 08/19/2016   Procedure: Coronary Stent Intervention;  Surgeon: [name], MD;  Location: [place] CV LAB;  Service: Cardiovascular;  Laterality: N/A;   CORONARY STENT INTERVENTION N/A 04/13/2019   Procedure: CORONARY STENT INTERVENTION;  Surgeon: [name], MD;  Location: [place] CV LAB;  Service: Cardiovascular;  Laterality: N/A;   ESOPHAGEAL  MANOMETRY N/A 12/16/2017   Procedure: ESOPHAGEAL MANOMETRY (EM);  Surgeon: Lin Landsman, MD;  Location: [facility] ENDOSCOPY;  Service: Gastroenterology;  Laterality: N/A;   ESOPHAGOGASTRODUODENOSCOPY N/A 12/20/2016   Procedure: ESOPHAGOGASTRODUODENOSCOPY (EGD);  Surgeon: Lin Landsman, MD;  Location[facility] END[facility]COPY;  Service: Gastroenterology;  Laterality: N/A;   ESOPHAGOGASTRODUODENOSCOPY N/A 01/13/2018   Procedure: ESOPHAGOGASTRODUODENOSCOPY (EGD);  Surgeon: Lin Landsman, MD;  Location[facility] END[facility]COPY;  Service: Gastroenterology;  Laterality: N/A;   ESOPHAGOGASTRODUODENOSCOPY (EGD) WITH PROPOFOL N/A 11/03/2017   Procedure: ESOPHAGOGASTRODUODENOSCOPY (EGD) WITH PROPOFOL;  Surgeon: Lin Landsman, MD;  Location: [facility] ENDOSCOPY;  Service: Gastroenterology;  Laterality: N/A;   EYE SURGERY     HERNIA REPAIR     "navel"   LEFT HEART CATH AND CORONARY ANGIOGRAPHY N/A 08/14/2016   Procedure: Left Heart Cath and Coronary Angiography;  Surgeon: [name], MD;  Location: [place] CV LAB;  Service: Cardiovascular;  Laterality: N/A;   PRESSURE SENSOR/CARDIOMEMS N/A 09/23/2019   Procedure: PRESSURE SENSOR/CARDIOMEMS;  Surgeon: Jolaine Artist, MD;  Location: [place] CV LAB;  Service: Cardiovascular;  Laterality: N/A;   RIGHT HEART CATH N/A 09/23/2019   Procedure: RIGHT HEART CATH;  Surgeon: Jolaine Artist, MD;  Location: [place] CV LAB;  Service: Cardiovascular;  Laterality: N/A;   RIGHT/LEFT HEART CATH AND CORONARY ANGIOGRAPHY N/A 09/01/2018   Procedure: RIGHT/LEFT HEART CATH AND CORONARY ANGIOGRAPHY;  Surgeon: [name], MD;  Location: [employer] CV LAB;  Service: Cardiovascular;  Laterality: N/A;   RIGHT/LEFT HEART CATH AND CORONARY ANGIOGRAPHY N/A 04/13/2019   Procedure: RIGHT/LEFT HEART CATH AND CORONARY ANGIOGRAPHY;  Surgeon: [name], MD;  Location: [place] CV LAB;  Service: Cardiovascular;  Laterality: N/A;   TOE AMPUTATION Right     "big toe"    Allergies  Allergies  Allergen Reactions   Clopidogrel Anaphylaxis and Other (See Comments)    altered mental status;  (electrolytes "out of wack" and confusion per family; THEY DO NOT REMEMBER ANY OTHER REACTION)- MSB 10/10/15   Ciprofloxacin Itching   Beta Adrenergic Blockers     Nightmares   Mirtazapine Diarrhea    Bad dreams    Spironolactone Other (See Comments)    gynecomastia    History of Present Illness    88 year old ? with a history of CAD status post prior RCA, circumflex, and LAD stents, ischemic cardiomyopathy, chronic combined systolic and diastolic congestive heart failure, hypertension, hyperlipidemia, diabetes, paroxysmal atrial fibrillation (on warfarin), stage III-IV chronic kidney disease, CML, iron deficiency anemia, GI bleed due to duodenal ulcers (October 2019), BPH, hypothyroidism, and peripheral neuropathy with unsteady gait.  He previously underwent stenting of the right coronary artery in June 2018 was noted to have residual 70% left main disease.  He was evaluated by CT surgery at that time and felt not to be a suitable surgical candidate.  Due to ongoing symptoms, he underwent PCI of the LAD and January 2021.  EF has been variable over the years  with a high of 55% by echocardiogram in 2019 with subsequent decline in EF to 30-35% by echo in February 2021.  In July 2021, he underwent CardioMEMS placement and this has been followed by our advanced heart failure team in [place] since.  Beta-blocker therapy was previously discontinued secondary to nightmares (both metoprolol and carvedilol).  Echocardiogram in August 2022 showed persistent LV dysfunction with an EF of 30 to 35%.  Due to ongoing orthostasis, hydralazine was discontinued in August 2022, and losartan was cut in half from 25 to 12.5 mg daily, in late August.  Lucas Caldwell has since continued to have intermittent orthostatic lightheadedness.  Since his April 21 cardiology visit, he has been  experiencing dyspnea, bendopnea and abdominal distention.  We most recently adjusted his diuretics on May 17 in the setting of a PA diastolic pressure of 20 from his CardioMEMS device.  He took torsemide 40 mg twice daily x3 days and since has been taking 40 mg in the morning and 20 mg in the afternoon.  Initially, he says he thinks he is doing a little bit better but on further questioning, there does not appear to be any significant change in his activity tolerance.  He notes that he becomes dyspneic with minimal activity.  His weight at home has ranged from anywhere between 193 and 197.  [name] 198 on our scale today and was 199 a week ago.  He continues to note some abdominal bloating.  He denies chest pain, palpitations, PND, orthopnea, dizziness, syncope, or early satiety.  His biggest complaint today is that of frequent urination and urinary incontinence in the setting of diuretic therapy.  Home Medications    Current Outpatient Medications  Medication Sig Dispense Refill   acetaminophen (TYLENOL) 500 MG tablet Take 1,000 mg by mouth every 6 (six) hours as needed for moderate pain or headache.      Alcohol Swabs (B-D SINGLE USE SWABS REGULAR) PADS Use to check blood sugar up to 2 times daily 200 each 3   allopurinol (ZYLOPRIM) 100 MG tablet Take 1 tablet (100 mg total) by mouth daily. 90 tablet 3   aspirin 81 MG chewable tablet Chew 81 mg by mouth daily.     bosutinib (BOSULIF) 100 MG tablet Take 100 mg by mouth at bedtime. Take with food.     Cholecalciferol 25 MCG (1000 UT) capsule Take 1,000 Units by mouth daily.     famotidine (PEPCID) 40 MG tablet Take 1 tablet (40 mg total) by mouth 2 (two) times daily with a meal. 180 tablet 3   gabapentin (NEURONTIN) 300 MG capsule Take 1 capsule (300 mg total) by mouth 3 (three) times daily. 270 capsule 1   Insulin Glargine ([name] KWIKPEN) 100 UNIT/ML Inject 20 Units into the skin daily.     Insulin Pen Needle 31G X 5 MM MISC Use to inject insulin  daily. 100 each 3   isosorbide mononitrate (IMDUR) 60 MG 24 hr tablet Take 0.5 tablets (30 mg total) by mouth daily. 45 tablet 0   JARDIANCE 10 MG TABS tablet Take 1 tablet (10 mg total) by mouth daily with breakfast. 90 tablet 1   Lancets (ONETOUCH DELICA PLUS [id number]) MISC Use to check blood sugar twice daily 200 each 3   levothyroxine (SYNTHROID) 25 MCG tablet TAKE ONE TABLET BY MOUTH EVERY MORNING BEFORE BREAKFAST 90 tablet 1   losartan (COZAAR) 25 MG tablet TAKE 1/2 TABLET(12.5 MG) BY MOUTH DAILY 45 tablet 1   Menthol-Zinc Oxide (GOLD  BOND EX) Apply 1 application topically daily as needed (muscle pain).     Multiple Vitamin (MULTIVITAMIN WITH MINERALS) TABS tablet Take 1 tablet by mouth daily.     nitroGLYCERIN (NITROSTAT) 0.4 MG SL tablet Place 1 tablet (0.4 mg total) under the tongue every 5 (five) minutes x 3 doses as needed for chest pain. 25 tablet 1   ONETOUCH VERIO test strip Check blood sugar up to 2 x daily 200 each 3   Polyethyl Glycol-Propyl Glycol (SYSTANE OP) Place 1 drop into both eyes 3 (three) times daily as needed (dry/irritated eyes.).      polyethylene glycol (MIRALAX / GLYCOLAX) 17 g packet Take 17 g by mouth daily as needed for mild constipation.      ranolazine (RANEXA) 500 MG 12 hr tablet TAKE ONE TABLET BY MOUTH TWICE DAILY 180 tablet 0   rosuvastatin (CRESTOR) 5 MG tablet Take 1 tablet (5 mg total) by mouth daily. 90 tablet 3   tamsulosin (FLOMAX) 0.4 MG CAPS capsule Take 1 capsule (0.4 mg total) by mouth daily after supper. 30 capsule 11   torsemide (DEMADEX) 20 MG tablet Take 1 tablet (20 mg total) by mouth as directed. TAKE 2 tablets twice a day for 3 days THEN take 2 tablets in AM and 1 tablet in PM. 180 tablet 3   traMADol (ULTRAM) 50 MG tablet Take 1-2 tablets (50-100 mg total) by mouth at bedtime as needed. 30 tablet 2   warfarin (COUMADIN) 3 MG tablet TAKE 1 TO 1 & 1/2 TABLETS BY MOUTH AS DIRECTED BY THE COUMADIN CLINIC Strength: 3 mg 135 tablet 0   No  current facility-administered medications for this visit.     Review of Systems    Ongoing dyspnea on exertion and some increase in abdominal girth.  [name] bothered by urinary frequency and incontinence.  He denies chest pain, palpitations, PND, orthopnea, dizziness, syncope, edema, or early satiety.  All other systems reviewed and are otherwise negative except as noted above.    Physical Exam    VS:  BP 130/80 (BP Location: Left Arm, Patient Position: Sitting, Cuff Size: Normal)   Pulse 93   Ht '5\' 3"'$  (1.6 m)   Wt 198 lb (89.8 kg)   SpO2 96%   BMI 35.07 kg/m  , BMI Body mass index is 35.07 kg/m.     GEN: Well nourished, well developed, in no acute distress. HEENT: normal. Neck: Supple, mildly elevated JVP.  No bruits or masses. Cardiac: RRR, no murmurs, rubs, or gallops. No clubbing, cyanosis, edema.  Radials/PT 1+ and equal bilaterally.  Respiratory:  Respirations regular and unlabored, clear to auscultation bilaterally. GI: Soft, nontender, nondistended, BS + x 4. MS: no deformity or atrophy. Skin: warm and dry, no rash. Neuro:  Strength and sensation are intact. Psych: Normal affect.  Accessory Clinical Findings    ECG personally reviewed by me today -regular sinus rhythm, 93, PVC, left axis deviation, left anterior fascicular block, right bundle branch block- no acute changes.  Lab Results  Component Value Date   WBC 14.1 (H) 02/24/2021   HGB 11.9 (L) 02/24/2021   HCT 37.2 (L) 02/24/2021   MCV 84.0 02/24/2021   PLT 202 02/24/2021   Lab Results  Component Value Date   CREATININE 1.70 (H) 08/09/2021   BUN 33 (H) 08/09/2021   NA 131 (L) 08/09/2021   K 4.3 08/09/2021   CL 96 (L) 08/09/2021   CO2 26 08/09/2021   Lab Results  Component Value Date  ALT 10 02/22/2021   AST 18 02/22/2021   ALKPHOS 57 02/22/2021   BILITOT 1.1 02/22/2021   Lab Results  Component Value Date   CHOL 163 07/06/2021   HDL 37 (L) 07/06/2021   LDLCALC 89 07/06/2021   TRIG 186 (H)  07/06/2021   CHOLHDL 4.4 07/06/2021    Lab Results  Component Value Date   HGBA1C 7.8 (H) 07/16/2021    Assessment & Plan    1.  Chronic heart failure with reduced ejection fraction/ischemic cardiomyopathy: EF 30 to 35% in August 2022 with moderate to severe global hypokinesis and grade 1 diastolic dysfunction.  Last week, in the setting of the PA diastolic pressure of 20 with weight of 299 pounds, and increasing abdominal girth, I had him take torsemide 40 mg twice daily x3 days and since, has been taking 40 mg in the morning and 20 mg in the afternoon.  His weight has vacillated at home but is down 1 pound since his visit last week on our scale today.  He continues to have dyspnea with minimal exertion and some increase in abdominal girth.  On examination, he has mild JVD with a soft abdomen and no lower extremity edema.  I did reach out to the advanced heart failure clinic and over the past 2 days, his PA diastolic has been recorded at 18 mmHg.  I also checked a basic metabolic panel today, and his BUN and creatinine are up slightly above baseline at 33 and 1.70.  [name] also mildly hyponatremic at 131.  I have asked that his torsemide dose be reduced to 40 mg every other day alternating with 40 mg in the morning and 20 mg in the afternoon.  We may need to consider adding a dose of metolazone throughout the week.  Continue ARB and Jardiance.  No beta-blocker secondary to nightmares.  2.  Coronary artery disease: Status post prior circumflex, RCA, and LAD stenting.  No chest pain.  Continue aspirin, statin, Ranexa, and long-acting nitrate therapy.  3.  Stage III chronic kidney disease: As above, labs today showed acute worsening of renal function with a BUN/creatinine of 33 and 1.70 in the setting of higher diuretic dosing.  Reducing torsemide back to 40 mg alternating with 40/20 mg every other day.  We will plan for follow-up labs at follow-up visit next week.  4.  Paroxysmal atrial fibrillation:  Maintaining sinus rhythm.  [name] anticoagulated with warfarin.  No beta-blocker in the setting of history of night terrors and is not otherwise on an AV nodal blocking agent.  5.  Hyperlipidemia: Continue statin therapy.  LDL was not at goal in April-89 at that time.  Previously 65 in April 2022 and thus, we recommend lifestyle modifications.  6.  Type 2 diabetes mellitus: A1c 7.8 in May.  For primary care.  7.  Orthostatic hypotension: No complaints of orthostasis today.  8.  Peripheral neuropathy: This has resulted in unsteady gait and he has been using a cane.  Overall stable.  Follows with neurology.  9.  Follow-up in clinic in 1 week or sooner if necessary.  Plan basic metabolic panel at that time.   [name], NP 08/09/2021, 5:30 PM

## 2021-08-09 NOTE — Patient Instructions (Signed)
Medication Instructions:   Your physician recommends that you continue on your current medications as directed. Please refer to the Current Medication list given to you today.  *If you need a refill on your cardiac medications before your next appointment, please call your pharmacy*   Lab Work:  Please go to the [facility] after your appointment  today for a Lab draw (BMP)   Testing/Procedures: None ordered   Follow-Up: At [facility], you and your health needs are our priority.  As part of our continuing mission to provide you with exceptional heart care, we have created designated Provider Care Teams.  These Care Teams include your primary Cardiologist (physician) and Advanced Practice Providers (APPs -  Physician Assistants and Nurse Practitioners) who all work together to provide you with the care you need, when you need it.  We recommend signing up for the patient portal called "MyChart".  Sign up information is provided on this After Visit Summary.  MyChart is used to connect with patients for Virtual Visits (Telemedicine).  Patients are able to view lab/test results, encounter notes, upcoming appointments, etc.  Non-urgent messages can be sent to your provider as well.   To learn more about what you can do with MyChart, go to [email or web address].    Your next appointment:    Friday 08/17/21  at 2:00 PM  The format for your next appointment:   In Person  Provider:    [name], NP    Other Instructions   Important Information About Sugar

## 2021-08-10 ENCOUNTER — Other Ambulatory Visit: Payer: Self-pay | Admitting: Family Medicine

## 2021-08-10 ENCOUNTER — Telehealth: Payer: Self-pay | Admitting: *Deleted

## 2021-08-10 DIAGNOSIS — E039 Hypothyroidism, unspecified: Secondary | ICD-10-CM

## 2021-08-10 MED ORDER — TORSEMIDE 20 MG PO TABS: 20.0000 mg | ORAL_TABLET | ORAL | 3 refills | Status: DC

## 2021-08-10 NOTE — Telephone Encounter (Signed)
-----   Message from Theora Gianotti, NP sent at 08/09/2021  5:09 PM EDT ----- Kidneys appear little drier on the higher dose of torsemide.  I like him to cut back to 40 mg alternating with 40 mg in the morning and 20 mg in the afternoon-every other day.  Watch salt intake very carefully as we do know that his pulmonary artery pressures are mildly elevated.  Keep follow-up as planned.

## 2021-08-10 NOTE — Telephone Encounter (Signed)
[name] is following up. She states she would like to discuss his test results as well if possible.

## 2021-08-10 NOTE — Telephone Encounter (Signed)
Reviewed results and recommendations with patient and he verbalized understanding with no further questions at this time.  

## 2021-08-10 NOTE — Telephone Encounter (Signed)
Requested medication (s) are due for refill today:   Yes  Prescribed today  Requested medication (s) are on the active medication list:   Yes  Future visit scheduled:   No      Last ordered: 08/10/2021 #90, 1 refill  Returned because per protocol TSH due   Requested Prescriptions  Pending Prescriptions Disp Refills   levothyroxine (SYNTHROID) 25 MCG tablet [Pharmacy Med Name: LEVOTHYROXINE 0.'025MG'$  (25MCG) TAB] 90 tablet 1    Sig: TAKE ONE TABLET BY MOUTH EVERY MORNING BEFORE BREAKFAST     Endocrinology:  Hypothyroid Agents Failed - 08/09/2021  6:29 AM      Failed - TSH in normal range and within 360 days    TSH  Date Value Ref Range Status  05/04/2019 3.850 0.450 - 4.500 uIU/mL Final         Passed - Valid encounter within last 12 months    Recent Outpatient Visits           3 weeks ago Controlled type 2 diabetes mellitus with diabetic nephropathy, with long-term current use of insulin ([place])   [place], DO   2 months ago Diarrhea, unspecified type   [place], DO   2 months ago Nausea   [facility] [place], [place] W, NP   3 months ago Gastroesophageal reflux disease without esophagitis   [place], DO   5 months ago Type 2 diabetes mellitus with diabetic toe ulcer ([place])   [place], [name], DO       Future Appointments             In 1 week Sharolyn Douglas, Clance Boll, NP [facility], [place]   In 2 weeks Debroah Loop, [place]   In 1 month End, [name], MD [facility], [place]

## 2021-08-14 ENCOUNTER — Other Ambulatory Visit: Payer: Self-pay | Admitting: Physician Assistant

## 2021-08-14 ENCOUNTER — Telehealth: Payer: Self-pay | Admitting: Family Medicine

## 2021-08-14 DIAGNOSIS — E1121 Type 2 diabetes mellitus with diabetic nephropathy: Secondary | ICD-10-CM

## 2021-08-14 DIAGNOSIS — Z794 Long term (current) use of insulin: Secondary | ICD-10-CM

## 2021-08-14 NOTE — Telephone Encounter (Signed)
[place] calling from [facility], Pts Diabetic Testing Supplies have been denied. Giving Dr. Raliegh Ip an opportunity to do a peer to peer.  It is being denied because the member has participating provider that can supply the items. The [employer] equipment is out of network for the patient. Cb- [phone] option 3 Have until June 1p to respond Reference- [id number]

## 2021-08-14 NOTE — Telephone Encounter (Signed)
Routing message to [place] for review.  I am unfamiliar with this rule through Medicare. It sounds like patient is able to get DM testing supplies covered through a different "provider"?  May need from a covered/ in network DME store?  I don't believe peer to peer would resolve this issue if it is a network issue.  [name], [facility] 08/14/2021, 4:50 PM

## 2021-08-14 NOTE — Telephone Encounter (Signed)
Reviewed results and recommendations with patients wife per release form. She verbalized understanding of instructions and had no further questions at this time.

## 2021-08-14 NOTE — Telephone Encounter (Signed)
rx was sent to pharmacy on 08/10/21 #90/1 E-Prescribing Status: Receipt confirmed by pharmacy (08/10/2021 5:00 PM EDT) Requested Prescriptions  Pending Prescriptions Disp Refills  . levothyroxine (SYNTHROID) 25 MCG tablet [Pharmacy Med Name: LEVOTHYROXINE 0.'025MG'$  (25MCG) TAB] 90 tablet 1    Sig: TAKE ONE TABLET BY MOUTH EVERY MORNING BEFORE BREAKFAST     Endocrinology:  Hypothyroid Agents Failed - 08/10/2021  2:10 PM      Failed - TSH in normal range and within 360 days    TSH  Date Value Ref Range Status  05/04/2019 3.850 0.450 - 4.500 uIU/mL Final         Passed - Valid encounter within last 12 months    Recent Outpatient Visits          4 weeks ago Controlled type 2 diabetes mellitus with diabetic nephropathy, with long-term current use of insulin ([place])   [place], DO   2 months ago Diarrhea, unspecified type   [place], DO   2 months ago Nausea   [facility] [place], [place] W, NP   4 months ago Gastroesophageal reflux disease without esophagitis   [place], DO   5 months ago Type 2 diabetes mellitus with diabetic toe ulcer ([place])   [place], [name], DO      Future Appointments            In 3 days Sharolyn Douglas, Clance Boll, NP [facility], [place]   In 2 weeks Debroah Loop, [place]   In 1 month End, [name], MD [facility] [place], [place]

## 2021-08-15 ENCOUNTER — Ambulatory Visit: Payer: Medicare HMO | Admitting: Pharmacist

## 2021-08-15 DIAGNOSIS — N1831 Chronic kidney disease, stage 3a: Secondary | ICD-10-CM | POA: Diagnosis not present

## 2021-08-15 DIAGNOSIS — E1159 Type 2 diabetes mellitus with other circulatory complications: Secondary | ICD-10-CM

## 2021-08-15 DIAGNOSIS — I130 Hypertensive heart and chronic kidney disease with heart failure and stage 1 through stage 4 chronic kidney disease, or unspecified chronic kidney disease: Secondary | ICD-10-CM | POA: Diagnosis not present

## 2021-08-15 DIAGNOSIS — E785 Hyperlipidemia, unspecified: Secondary | ICD-10-CM

## 2021-08-15 DIAGNOSIS — Z794 Long term (current) use of insulin: Secondary | ICD-10-CM | POA: Diagnosis not present

## 2021-08-15 DIAGNOSIS — J449 Chronic obstructive pulmonary disease, unspecified: Secondary | ICD-10-CM | POA: Diagnosis not present

## 2021-08-15 DIAGNOSIS — I5023 Acute on chronic systolic (congestive) heart failure: Secondary | ICD-10-CM

## 2021-08-15 DIAGNOSIS — E1122 Type 2 diabetes mellitus with diabetic chronic kidney disease: Secondary | ICD-10-CM | POA: Diagnosis not present

## 2021-08-15 DIAGNOSIS — E11621 Type 2 diabetes mellitus with foot ulcer: Secondary | ICD-10-CM

## 2021-08-15 MED ORDER — ONETOUCH DELICA PLUS LANCET30G MISC: 3 refills | Status: DC

## 2021-08-15 MED ORDER — ONETOUCH VERIO VI STRP: ORAL_STRIP | 3 refills | Status: DC

## 2021-08-15 NOTE — Chronic Care Management (AMB) (Signed)
Chronic Care Management CCM Pharmacy Note  08/15/2021 Name:  Lucas Caldwell MRN:  [id number] DOB:  Feb 18, 1934  Subjective: Lucas Caldwell is an 88 y.o. year old male who is a primary patient of [name], DO.  The CCM team was consulted for assistance with disease management and care coordination needs.    Receive message from PCP requesting outreach to resolve billing issue for patient's diabetes testing supplies  Engaged with patient by telephone for follow up visit for pharmacy case management and/or care coordination services.   Objective:  Medications Reviewed Today     Reviewed by Theora Gianotti, NP (Nurse Practitioner) on 08/09/21 at 1715  Med List Status: <None>   Medication Order Taking? Sig Documenting Provider Last Dose Status Informant  acetaminophen (TYLENOL) 500 MG tablet [id number] Yes Take 1,000 mg by mouth every 6 (six) hours as needed for moderate pain or headache.  [provider] Taking Active Spouse/Significant Other  Alcohol Swabs (B-D SINGLE USE SWABS REGULAR) PADS [id number] Yes Use to check blood sugar up to 2 times daily [name], DO Taking Active Spouse/Significant Other  allopurinol (ZYLOPRIM) 100 MG tablet [id number] Yes Take 1 tablet (100 mg total) by mouth daily. Theora Gianotti, NP Taking Active   aspirin 81 MG chewable tablet [id number] Yes Chew 81 mg by mouth daily. [provider] Taking Active Spouse/Significant Other  bosutinib (BOSULIF) 100 MG tablet [id number] Yes Take 100 mg by mouth at bedtime. Take with food. [provider] Taking Active Spouse/Significant Other           Med Note Jeanie Cooks Sep 15, 2019  8:38 AM)    Cholecalciferol 25 MCG (1000 UT) capsule [id number] Yes Take 1,000 Units by mouth daily. [provider] Taking Active Spouse/Significant Other  famotidine (PEPCID) 40 MG tablet [id number] Yes Take 1 tablet (40 mg total) by mouth 2  (two) times daily with a meal. [place] Ranger, [name], DO Taking Active   gabapentin (NEURONTIN) 300 MG capsule [id number] Yes Take 1 capsule (300 mg total) by mouth 3 (three) times daily. [name], DO Taking Active   Insulin Glargine [facility] KWIKPEN) 100 UNIT/ML [id number] Yes Inject 20 Units into the skin daily. [provider] Taking Active   Insulin Pen Needle 31G X 5 MM MISC [id number] Yes Use to inject insulin daily. [name], DO Taking Active   isosorbide mononitrate (IMDUR) 60 MG 24 hr tablet [id number] Yes Take 0.5 tablets (30 mg total) by mouth daily. Theora Gianotti, NP Taking Active   JARDIANCE 10 MG TABS tablet [id number] Yes Take 1 tablet (10 mg total) by mouth daily with breakfast. [name], DO Taking Active   Lancets (ONETOUCH DELICA PLUS [id number]) [place] [id number] Yes Use to check blood sugar twice daily [name], DO Taking Active   levothyroxine (SYNTHROID) 25 MCG tablet [id number] Yes TAKE ONE TABLET BY MOUTH EVERY MORNING BEFORE BREAKFAST [name], DO Taking Active Spouse/Significant Other  losartan (COZAAR) 25 MG tablet [id number] Yes TAKE 1/2 TABLET(12.5 MG) BY MOUTH DAILY End, [name], MD Taking Active   Menthol-Zinc Oxide (GOLD [place]) [id number] Yes Apply 1 application topically daily as needed (muscle pain). [provider] Taking Active Spouse/Significant Other  Multiple Vitamin (MULTIVITAMIN WITH MINERALS) TABS tablet [id number] Yes Take 1 tablet by mouth daily. [provider] Taking Active Spouse/Significant Other  nitroGLYCERIN (NITROSTAT) 0.4 MG SL tablet [id number] Yes Place 1 tablet (0.4 mg total) under  the tongue every 5 (five) minutes x 3 doses as needed for chest pain. Theora Gianotti, NP Taking Active Spouse/Significant Other  ONETOUCH VERIO test strip [id number] Yes Check blood sugar up to 2 x daily [name], DO Taking Active    Polyethyl Glycol-Propyl Glycol (SYSTANE OP) [id number] Yes Place 1 drop into both eyes 3 (three) times daily as needed (dry/irritated eyes.).  [provider] Taking Active Spouse/Significant Other  polyethylene glycol (MIRALAX / GLYCOLAX) 17 g packet [id number] Yes Take 17 g by mouth daily as needed for mild constipation.  [provider] Taking Active Spouse/Significant Other  ranolazine (RANEXA) 500 MG 12 hr tablet [id number] Yes TAKE ONE TABLET BY MOUTH TWICE DAILY End, [name], MD Taking Active   rosuvastatin (CRESTOR) 5 MG tablet [id number] Yes Take 1 tablet (5 mg total) by mouth daily. End, [name], MD Taking Active   tamsulosin [facility]) 0.4 MG CAPS capsule [id number] Yes Take 1 capsule (0.4 mg total) by mouth daily after supper. Rise Mu, PA-C Taking Active Spouse/Significant Other  torsemide (DEMADEX) 20 MG tablet [id number] Yes Take 1 tablet (20 mg total) by mouth as directed. TAKE 2 tablets twice a day for 3 days THEN take 2 tablets in AM and 1 tablet in PM. Theora Gianotti, NP Taking Active   traMADol (ULTRAM) 50 MG tablet [id number] Yes Take 1-2 tablets (50-100 mg total) by mouth at bedtime as needed. [name], DO Taking Active Spouse/Significant Other  warfarin (COUMADIN) 3 MG tablet [id number] Yes TAKE 1 TO 1 & 1/2 TABLETS BY MOUTH AS DIRECTED BY THE COUMADIN CLINIC Strength: 3 mg End, [name], MD Taking Active             Pertinent Labs:  Lab Results  Component Value Date   HGBA1C 7.8 (H) 07/16/2021   Lab Results  Component Value Date   CREATININE 1.70 (H) 08/09/2021   BUN 33 (H) 08/09/2021   NA 131 (L) 08/09/2021   K 4.3 08/09/2021   CL 96 (L) 08/09/2021   CO2 26 08/09/2021    SDOH:  (Social Determinants of Health) assessments and interventions performed:    [name]  Review of patient past medical history, allergies, medications, health status, including review of consultants reports, laboratory and other  test data, was performed as part of comprehensive evaluation and provision of chronic care management services.   Care Plan : PharmD - Medication Management/Assistance  Updates made by Rennis Petty, RPH-CPP since 08/15/2021 12:00 AM     Problem: Disease Progression      Long-Range Goal: Disease Progression Prevented or Minimized   Start Date: 04/14/2020  Expected End Date: 07/13/2020  Recent Progress: On track  Priority: High  Note:   Current Barriers:  Chronic Disease Management support, education, and care coordination needs related to type 2 diabetes, CKD, HFrEF s/p CardioMEMS, hyperlipidemia, atrial fibrillation, hypertension, coronary artery disease, CHF, hypothyroidism, and CML Financial Note patient has Partial Extra Help/LIS Subsidy Patient enrolled in [place] patient assistance program for 2023 calendar year Patient denied for patient assistance for [place] from [facility] for 2022 calendar year Patient previously enrolled in [place] for [employer] copayment assistance, but eligibility expected to expire on 06/09/2021 as not in use (currently no copayment through Part D plan Limited vision  Pharmacist Clinical Goal(s):  Over the next 90 days, patient will verbalize ability to afford treatment regimen through collaboration with PharmD and provider.   Interventions: 1:1 collaboration with [name], DO  regarding development and update of comprehensive plan of care as evidenced by provider attestation and co-signature Inter-disciplinary care team collaboration (see longitudinal plan of care) Receive message from PCP requesting assistance with resolving a billing issue for patient's diabetes testing supplies. Per message from patient's [facility], a claim for patient's diabetes testing supplies has been "denied because the member has participating provider that can supply the items" Reach out to [facility] and complete a  peer to peer discussion with Dr. Stann Mainland for further clarification. Find that per patient's current HMO [facility] Advantage plan, diabetes testing supplies will only be covered through his plan if sent through one of the following preferred DME suppliers: [facility] [phone]), [facility] [phone]) or [facility] [phone]). CPP sends e-prescription for patient's testing strips and lancets to [facility] today Follow up with [employer] [facility] pharmacy technician [place] who advises that company will reach out to patient in the next 1-2 days and once shipped, supplies typically take ~3-5 days to arrive to patient Outreach to follow up with patient. Reach patient's wife and explain coverage information as provided by [facility]  Patient's spouse reports patient has >1 week of testing supplies remaining  Provide phone number for [facility] [phone]) so that patient/spouse can follow up as needed   Patient Goals/Self-Care Activities Over the next 90 days, patient will:  Check blood sugar, blood pressure and fluid status as directed by providers  Take medications, with assistance of wife, as directed Mrs. Sammarco manages patient's medications using weekly pillbox as adherence tool Attends scheduled medical appointments  Follow Up Plan: Telephone follow up appointment with care management team member scheduled for: 08/20/2021 at 10:00 AM       [name], PharmD, [place], [place] [phone]

## 2021-08-15 NOTE — Patient Instructions (Signed)
Visit Information  Thank you for taking time to visit with me today. Please don't hesitate to contact me if I can be of assistance to you before our next scheduled telephone appointment.  Following are the goals we discussed today:   Goals Addressed             This Visit's Progress    Pharmacy Goals       Please check your blood sugar, keep a log of the results and bring this with you to your medical appointments.  Please check your home blood pressure, keep a log of the results and bring this with you to your medical appointments.  Our goal bad cholesterol, or LDL, is less than 70 . This is why it is important to continue taking your rosuvastatin.  Feel free to call me with any questions or concerns. I look forward to our next call!   [name], PharmD, [place] [phone]         Our next appointment is by telephone on 08/20/2021 at 10:00 AM  Please call the care guide team at [phone] if you need to cancel or reschedule your appointment.    The patient verbalized understanding of instructions, educational materials, and care plan provided today and DECLINED offer to receive copy of patient instructions, educational materials, and care plan.

## 2021-08-15 NOTE — Telephone Encounter (Signed)
Refused the refill request from [facility].   Order sent to mail order [facility].

## 2021-08-17 ENCOUNTER — Ambulatory Visit (INDEPENDENT_AMBULATORY_CARE_PROVIDER_SITE_OTHER): Payer: Medicare HMO | Admitting: Nurse Practitioner

## 2021-08-17 ENCOUNTER — Other Ambulatory Visit
Admission: RE | Admit: 2021-08-17 | Discharge: 2021-08-17 | Disposition: A | Discharge status: Home / Self Care | Payer: Medicare HMO | Attending: Nurse Practitioner | Admitting: Nurse Practitioner

## 2021-08-17 ENCOUNTER — Encounter: Payer: Self-pay | Admitting: Nurse Practitioner

## 2021-08-17 VITALS — BP 118/62 | HR 90 | Ht 64.0 in | Wt 196.2 lb

## 2021-08-17 DIAGNOSIS — I5022 Chronic systolic (congestive) heart failure: Secondary | ICD-10-CM | POA: Diagnosis not present

## 2021-08-17 DIAGNOSIS — N184 Chronic kidney disease, stage 4 (severe): Secondary | ICD-10-CM | POA: Diagnosis not present

## 2021-08-17 DIAGNOSIS — I10 Essential (primary) hypertension: Secondary | ICD-10-CM | POA: Diagnosis not present

## 2021-08-17 DIAGNOSIS — I2510 Atherosclerotic heart disease of native coronary artery without angina pectoris: Secondary | ICD-10-CM

## 2021-08-17 DIAGNOSIS — I255 Ischemic cardiomyopathy: Secondary | ICD-10-CM | POA: Diagnosis not present

## 2021-08-17 LAB — BASIC METABOLIC PANEL
Anion gap: 9 (ref 5–15)
BUN: 30 mg/dL — ABNORMAL HIGH (ref 8–23)
CO2: 26 mmol/L (ref 22–32)
Calcium: 9.3 mg/dL (ref 8.9–10.3)
Chloride: 101 mmol/L (ref 98–111)
Creatinine, Ser: 1.42 mg/dL — ABNORMAL HIGH (ref 0.61–1.24)
GFR, Estimated: 48 mL/min — ABNORMAL LOW (ref >60)
Glucose, Bld: 173 mg/dL — ABNORMAL HIGH (ref 70–99)
Potassium: 4.2 mmol/L (ref 3.5–5.1)
Sodium: 136 mmol/L (ref 135–145)

## 2021-08-17 NOTE — Patient Instructions (Signed)
Medication Instructions:   Your physician recommends that you continue on your current medications as directed. Please refer to the Current Medication list given to you today.   *If you need a refill on your cardiac medications before your next appointment, please call your pharmacy*   Lab Work:  Today: Engineer, production at [facility] 1st desk on the right to check in (REGISTRATION)  Lab hours: Monday- Friday (7:30 am- 5:30 pm)   If you have labs (blood work) drawn today and your tests are completely normal, you will receive your results only by: MyChart Message (if you have MyChart) OR A paper copy in the mail If you have any lab test that is abnormal or we need to change your treatment, we will call you to review the results.   Testing/Procedures: None ordered   Follow-Up: At [facility], you and your health needs are our priority.  As part of our continuing mission to provide you with exceptional heart care, we have created designated Provider Care Teams.  These Care Teams include your primary Cardiologist (physician) and Advanced Practice Providers (APPs -  Physician Assistants and Nurse Practitioners) who all work together to provide you with the care you need, when you need it.  We recommend signing up for the patient portal called "MyChart".  Sign up information is provided on this After Visit Summary.  MyChart is used to connect with patients for Virtual Visits (Telemedicine).  Patients are able to view lab/test results, encounter notes, upcoming appointments, etc.  Non-urgent messages can be sent to your provider as well.   To learn more about what you can do with MyChart, go to [email or web address].    Your next appointment:   As scheduled  The format for your next appointment:   In Person  Provider:   You may see [name], MD or one of the following Advanced Practice Providers on your designated Care Team:   [name], NP [name],  PA-C Cadence Kathlen Mody, [place]   Other Instructions N/A  Important Information About Sugar

## 2021-08-17 NOTE — Progress Notes (Signed)
Office Visit    Patient Name: Lucas Caldwell Date of Encounter: 08/17/2021  Primary Care Provider:  [name], DO Primary Cardiologist:  [name], MD  Chief Complaint    88 y/o ? with a history of CAD status post prior RCA, circumflex, and LAD stenting, ischemic cardiomyopathy, chronic combined systolic and diastolic congestive heart failure, hypertension, hyperlipidemia, diabetes, paroxysmal atrial fibrillation (on warfarin), stage III-IV chronic kidney disease, CML, iron deficiency anemia, GI bleed due to duodenal ulcers (12/2017), BPH, hypothyroidism, and peripheral neuropathy with unsteady gait, who presents for follow-up of heart failure.  Past Medical History    Past Medical History:  Diagnosis Date   Arthritis    "probably in his legs" (08/15/2016)   Blind right eye    BPH (benign prostatic hyperplasia)    Breast asymmetry    Left breast is larger, present for several years.   CAD (coronary artery disease)    a. Cath in the late 90's - reportedly ok;  b. 2014 s/p stenting x 2 @ [facility]; c 08/19/16 Cath/PCI with DES -> RCA, plan to treat LM 70% medically. Seen by surgery and felt to be too high risk for CABG; d. 08/2018 Cath: LM 70 (iFR 0.86), LAD 20ost, 40p, 50/69m D1 70, LCX patent stent, OM1 30, RCA patent stent, 426mSR, 40d, RPDA 30. PCWP 8. CO/CI 4.0/2.1; e. 03/2019 PCI to LAD (2.5x15 Resolute Onyx DES).   Chronic combined systolic (congestive) and diastolic (congestive) heart failure ([place]   a. Previously reduced EF-->50% by echo in 2012;  b. 06/2015 Echo: EF 50-55%  c. 07/2016 Echo: EF 45-50%; d. 12/2017 Echo: EF 55%; e. 08/2018 Echo: EF 35-40%; f. 04/2019 Echo: EF 30-35%; g. s/p cardiomems; h. 10/2020 Echo: EF 30-35%, mod-sev glob HK, gr1 DD.   Chronic kidney disease (CKD), stage IV (severe) (HCC)    CML (chronic myelocytic leukemia) (HCC)    GERD (gastroesophageal reflux disease)    GIB (gastrointestinal bleeding)    a. 12/2017 3 unit PRBC GIB in setting  of coumadin-->Endo/colon multiple duodenal ulcers and a single bleeding ulcer in the proximal ascending colon status post hemostatic clipping x2.   Gout    Hyperlipemia    Hypertension    Hypothyroidism    Iron deficiency anemia    Ischemic cardiomyopathy    a. Previously reduced EF-->50% by echo in 2012;  b. 06/2015 Echo: EF 50-55%, Gr2 DD;  c. 07/2016 Echo: EF 45-50%; d. 12/2017 Echo: EF 55%, Gr1 DD, mild MR; e. 08/2018 Echo: EF 35-40%; f. 04/2019 Echo: EF 30-35%; g. 10/2020 Echo: EF 30-35%.   Migraine    "in the 1960s" (08/15/2016)   Obstructive sleep apnea    Orthostatic hypotension    a. 20222 -->carvedilol and hydralazine d/c'd.   PAF (paroxysmal atrial fibrillation) (HCC)    a. ? Dx 2014-->s/p DCCV;  b. CHA2DS2VASc = 6--> Coumadin.   Prostate cancer ([facility]   RBBB    [facility]   Past Surgical History:  Procedure Laterality Date   AMPUTATION TOE Right 02/23/2021   Procedure: AMPUTATION TOE;  Surgeon: FoSamara DeistDPM;  Location: [facility] ORS;  Service: Podiatry;  Laterality: Right;   CATARACT EXTRACTION W/ INTRAOCULAR LENS  IMPLANT, BILATERAL Bilateral    COLONOSCOPY N/A 01/13/2018   Procedure: COLONOSCOPY;  Surgeon: VaLin LandsmanMD;  [facility];  Service: Gastroenterology;  Laterality: N/A;   COLONOSCOPY WITH PROPOFOL N/A 01/01/2018   Procedure: COLONOSCOPY WITH PROPOFOL;  Surgeon: Lin Landsman, MD;  Location[facility]COPY;  Service: Gastroenterology;  Laterality: N/A;   CORONARY ANGIOPLASTY WITH STENT PLACEMENT  09/2012   2 stents   CORONARY STENT INTERVENTION N/A 08/19/2016   Procedure: Coronary Stent Intervention;  Surgeon: [name], MD;  Location: [place] CV LAB;  Service: Cardiovascular;  Laterality: N/A;   CORONARY STENT INTERVENTION N/A 04/13/2019   Procedure: CORONARY STENT INTERVENTION;  Surgeon: [name], MD;  Location: [place] CV LAB;  Service: Cardiovascular;  Laterality: N/A;   ESOPHAGEAL  MANOMETRY N/A 12/16/2017   Procedure: ESOPHAGEAL MANOMETRY (EM);  Surgeon: Lin Landsman, MD;  Location: [facility] ENDOSCOPY;  Service: Gastroenterology;  Laterality: N/A;   ESOPHAGOGASTRODUODENOSCOPY N/A 12/20/2016   Procedure: ESOPHAGOGASTRODUODENOSCOPY (EGD);  Surgeon: Lin Landsman, MD;  Location[facility];  Service: Gastroenterology;  Laterality: N/A;   ESOPHAGOGASTRODUODENOSCOPY N/A 01/13/2018   Procedure: ESOPHAGOGASTRODUODENOSCOPY (EGD);  Surgeon: Lin Landsman, MD;  Location[facility] END[facility]COPY;  Service: Gastroenterology;  Laterality: N/A;   ESOPHAGOGASTRODUODENOSCOPY (EGD) WITH PROPOFOL N/A 11/03/2017   Procedure: ESOPHAGOGASTRODUODENOSCOPY (EGD) WITH PROPOFOL;  Surgeon: Lin Landsman, MD;  Location: [facility] ENDOSCOPY;  Service: Gastroenterology;  Laterality: N/A;   EYE SURGERY     HERNIA REPAIR     "navel"   LEFT HEART CATH AND CORONARY ANGIOGRAPHY N/A 08/14/2016   Procedure: Left Heart Cath and Coronary Angiography;  Surgeon: [name], MD;  Location: [place] CV LAB;  Service: Cardiovascular;  Laterality: N/A;   PRESSURE SENSOR/CARDIOMEMS N/A 09/23/2019   Procedure: PRESSURE SENSOR/CARDIOMEMS;  Surgeon: Jolaine Artist, MD;  Location: [place] CV LAB;  Service: Cardiovascular;  Laterality: N/A;   RIGHT HEART CATH N/A 09/23/2019   Procedure: RIGHT HEART CATH;  Surgeon: Jolaine Artist, MD;  Location: [place] CV LAB;  Service: Cardiovascular;  Laterality: N/A;   RIGHT/LEFT HEART CATH AND CORONARY ANGIOGRAPHY N/A 09/01/2018   Procedure: RIGHT/LEFT HEART CATH AND CORONARY ANGIOGRAPHY;  Surgeon: [name], MD;  Location: [place] CV LAB;  Service: Cardiovascular;  Laterality: N/A;   RIGHT/LEFT HEART CATH AND CORONARY ANGIOGRAPHY N/A 04/13/2019   Procedure: RIGHT/LEFT HEART CATH AND CORONARY ANGIOGRAPHY;  Surgeon: [name], MD;  Location: [place] CV LAB;  Service: Cardiovascular;  Laterality: N/A;   TOE AMPUTATION Right     "big toe"    Allergies  Allergies  Allergen Reactions   Clopidogrel Anaphylaxis and Other (See Comments)    altered mental status;  (electrolytes "out of wack" and confusion per family; THEY DO NOT REMEMBER ANY OTHER REACTION)- MSB 10/10/15   Ciprofloxacin Itching   Beta Adrenergic Blockers     Nightmares   Mirtazapine Diarrhea    Bad dreams    Spironolactone Other (See Comments)    gynecomastia    History of Present Illness    88 year old ? with a history of CAD status post prior RCA, circumflex, and LAD stents, ischemic cardiomyopathy, chronic combined systolic and diastolic congestive heart failure, hypertension, hyperlipidemia, diabetes, paroxysmal atrial fibrillation (on warfarin), stage III-IV chronic kidney disease, CML, iron deficiency anemia, GI bleed due to duodenal ulcers (October 2019), BPH, hypothyroidism, and peripheral neuropathy with unsteady gait.  He previously underwent stenting of the right coronary artery in June 2018 was noted to have residual 70% left main disease.  He was evaluated by CT surgery at that time and felt not to be a suitable surgical candidate.  Due to ongoing symptoms, he underwent PCI of the LAD and January 2021.  EF has been variable over the years  with a high of 55% by echocardiogram in 2019 with subsequent decline in EF to 30-35% by echo in February 2021.  In July 2021, he underwent CardioMEMS placement and this has been followed by our advanced heart failure team in [place] since.  Beta-blocker therapy was previously discontinued secondary to nightmares (both metoprolol and carvedilol).  Echocardiogram in August 2022 showed persistent LV dysfunction with an EF of 30 to 35%.  Due to ongoing orthostasis, hydralazine was discontinued in August 2022, and losartan was cut in half from 25 to 12.5 mg daily, in late August.  Mr. Findling has since had improvement in orthostatic lightheadedness.  Patient has been seen multiple times since late April in the  setting of increasing dyspnea, bendopnea, and abdominal distention.  This has been associated with a rise in his PA diastolic pressure, which is measured via his CardioMEMS device.  He has been trending around 18 but occasionally rises to 20-21.  Numbers have been relatively stable despite adjustments in torsemide dosing over the past month.  We reduced his torsemide to 40 mg in the morning and 20 mg every afternoon in the setting of bump in creatinine to 1.70 on May 25.  Since then, he has felt well and his weight is actually down 2 pounds.  He notes some improvement in dyspnea on exertion and abdominal girth.  He denies chest pain, palpitations, PND, orthopnea, dizziness, syncope, edema, or early satiety.  [name] still bothered by frequent urination but this has improved slightly.  Home Medications    Current Outpatient Medications  Medication Sig Dispense Refill   acetaminophen (TYLENOL) 500 MG tablet Take 1,000 mg by mouth every 6 (six) hours as needed for moderate pain or headache.      Alcohol Swabs (B-D SINGLE USE SWABS REGULAR) PADS Use to check blood sugar up to 2 times daily 200 each 3   allopurinol (ZYLOPRIM) 100 MG tablet Take 1 tablet (100 mg total) by mouth daily. 90 tablet 3   aspirin 81 MG chewable tablet Chew 81 mg by mouth daily.     bosutinib (BOSULIF) 100 MG tablet Take 100 mg by mouth at bedtime. Take with food.     Cholecalciferol 25 MCG (1000 UT) capsule Take 1,000 Units by mouth daily.     famotidine (PEPCID) 40 MG tablet Take 1 tablet (40 mg total) by mouth 2 (two) times daily with a meal. 180 tablet 3   gabapentin (NEURONTIN) 300 MG capsule Take 1 capsule (300 mg total) by mouth 3 (three) times daily. 270 capsule 1   Insulin Glargine ([name] KWIKPEN) 100 UNIT/ML Inject 20 Units into the skin daily.     Insulin Pen Needle 31G X 5 MM MISC Use to inject insulin daily. 100 each 3   isosorbide mononitrate (IMDUR) 60 MG 24 hr tablet Take 0.5 tablets (30 mg total) by mouth daily.  45 tablet 0   JARDIANCE 10 MG TABS tablet Take 1 tablet (10 mg total) by mouth daily with breakfast. 90 tablet 1   Lancets (ONETOUCH DELICA PLUS [id number]) MISC Use to check blood sugar twice daily 200 each 3   levothyroxine (SYNTHROID) 25 MCG tablet TAKE ONE TABLET BY MOUTH EVERY MORNING BEFORE BREAKFAST 90 tablet 1   losartan (COZAAR) 25 MG tablet TAKE 1/2 TABLET(12.5 MG) BY MOUTH DAILY 45 tablet 1   Menthol-Zinc Oxide (GOLD BOND EX) Apply 1 application topically daily as needed (muscle pain).     Multiple Vitamin (MULTIVITAMIN WITH MINERALS) TABS tablet Take 1 tablet  by mouth daily.     nitroGLYCERIN (NITROSTAT) 0.4 MG SL tablet Place 1 tablet (0.4 mg total) under the tongue every 5 (five) minutes x 3 doses as needed for chest pain. 25 tablet 1   ONETOUCH VERIO test strip Check blood sugar up to 2 x daily 200 each 3   Polyethyl Glycol-Propyl Glycol (SYSTANE OP) Place 1 drop into both eyes 3 (three) times daily as needed (dry/irritated eyes.).      polyethylene glycol (MIRALAX / GLYCOLAX) 17 g packet Take 17 g by mouth daily as needed for mild constipation.      ranolazine (RANEXA) 500 MG 12 hr tablet TAKE ONE TABLET BY MOUTH TWICE DAILY 180 tablet 0   rosuvastatin (CRESTOR) 5 MG tablet Take 1 tablet (5 mg total) by mouth daily. 90 tablet 3   tamsulosin (FLOMAX) 0.4 MG CAPS capsule Take 1 capsule (0.4 mg total) by mouth daily after supper. 30 capsule 11   torsemide (DEMADEX) 20 MG tablet Take 1 tablet (20 mg total) by mouth as directed. TAKE 2 tablets in the AM and 1 tablet in the evening, every other day. 180 tablet 3   traMADol (ULTRAM) 50 MG tablet Take 1-2 tablets (50-100 mg total) by mouth at bedtime as needed. 30 tablet 2   warfarin (COUMADIN) 3 MG tablet TAKE 1 TO 1 & 1/2 TABLETS BY MOUTH AS DIRECTED BY THE COUMADIN CLINIC Strength: 3 mg 135 tablet 0   No current facility-administered medications for this visit.     Review of Systems    Weight has been trending lower over the past  week.  He has ongoing dyspnea on exertion though this has improved slightly.  He denies chest pain, palpitations, PND, orthopnea, dizziness, syncope, edema, or early satiety.  Continues to report frequent urination.  All other systems reviewed and are otherwise negative except as noted above.    Physical Exam    VS:  BP 118/62 (BP Location: Left Arm, Patient Position: Sitting, Cuff Size: Large)   Pulse 90   Ht '5\' 4"'$  (1.626 m)   Wt 196 lb 3.2 oz (89 kg)   SpO2 97%   BMI 33.68 kg/m  , BMI Body mass index is 33.68 kg/m.     GEN: Well nourished, well developed, in no acute distress. HEENT: normal. Neck: Supple, no JVD, carotid bruits, or masses. Cardiac: RRR, no murmurs, rubs, or gallops. No clubbing, cyanosis, trace bilateral ankle edema.  Radials/DP/PT 2+ and equal bilaterally.  Respiratory:  Respirations regular and unlabored, clear to auscultation bilaterally. GI: Soft, nontender, nondistended, BS + x 4. MS: no deformity or atrophy. Skin: warm and dry, no rash. Neuro:  Strength and sensation are intact. Psych: Normal affect.  Accessory Clinical Findings    ECG personally reviewed by me today -regular sinus rhythm, 90, PVCs, left axis deviation, left anterior fascicular block, right bundle branch block- no acute changes.  Lab Results  Component Value Date   WBC 14.1 (H) 02/24/2021   HGB 11.9 (L) 02/24/2021   HCT 37.2 (L) 02/24/2021   MCV 84.0 02/24/2021   PLT 202 02/24/2021   Lab Results  Component Value Date   CREATININE 1.42 (H) 08/17/2021   BUN 30 (H) 08/17/2021   NA 136 08/17/2021   K 4.2 08/17/2021   CL 101 08/17/2021   CO2 26 08/17/2021   Lab Results  Component Value Date   ALT 10 02/22/2021   AST 18 02/22/2021   ALKPHOS 57 02/22/2021   BILITOT 1.1 02/22/2021  Lab Results  Component Value Date   CHOL 163 07/06/2021   HDL 37 (L) 07/06/2021   LDLCALC 89 07/06/2021   TRIG 186 (H) 07/06/2021   CHOLHDL 4.4 07/06/2021    Lab Results  Component Value Date    HGBA1C 7.8 (H) 07/16/2021    Assessment & Plan    1.  Chronic heart failure with reduced ejection fraction/ischemic cardiomyopathy: EF 30 to 35% by echo in August 2022 with moderate to severe global hypokinesis and grade 1 diastolic dysfunction.  He has required adjustment in diuretic dosing over the past month in the setting of dyspnea with elevations in PA diastolic pressure as measured by CardioMEMS device into the low 20s.  At his last visit a week ago, we reduced his torsemide to 40 in the morning and 20 in the afternoon in the setting of a rising creatinine to 1.70.  Over the past week, he has been feeling better with reduction in weight by 2 pounds on his home scales and on our scale as well.  He has chronic dyspnea on exertion which has been stable.  His frequency of urination has declined some and is a little more manageable.  On examination today, he has trace lower extremity edema but overall appears stable.  CardioMEMS PA diastolics have been trending 18 for the past few days though was 21 yesterday.  We agreed to continue current dose of torsemide at 40 mg in the morning and 20 mg in the afternoon and he will take an additional 20 mg as needed for weight gain of 2 pounds in 24 hours.  Continue ARB and Jardiance.  No beta-blocker secondary to nightmares.  2.  Coronary artery disease: Status post prior circumflex, RCA, and LAD stenting.  No chest pain.  Continue aspirin, statin, Ranexa, and long-acting nitrate therapy.  3.  Stage III chronic kidney disease: Reevaluate labs today with stable creatinine at 1.42.  He remains on ARB, SGLT2 inhibitor.  4.  Hyperlipidemia: Continue statin therapy.  LDL was not at goal in April-89 at that time.  Previously 37 in April 2022, and thus, we recommend lifestyle modifications.  5.  Paroxysmal atrial fibrillation: Maintaining sinus rhythm.  Anticoagulated with warfarin.  No beta-blocker in the setting of history of night terrors, and is not otherwise on  an AV nodal blocking agent.  6.  Type 2 diabetes mellitus: A1c 7.8 in May.  Followed by primary care.  7.  Orthostatic hypotension: This seems to have resolved.  8.  Peripheral neuropathy: Uses a cane to walk in the setting of unsteady gait.  Overall stable.  9.  Disposition: He has follow-up July 12.  He will contact us for worsening symptoms or weight gain.  He will continue to be followed remotely by the advanced heart failure clinic-cardioMEMS.  [name], NP 08/17/2021, 6:00 PM

## 2021-08-20 ENCOUNTER — Ambulatory Visit (INDEPENDENT_AMBULATORY_CARE_PROVIDER_SITE_OTHER): Payer: Medicare HMO | Admitting: Pharmacist

## 2021-08-20 DIAGNOSIS — I5032 Chronic diastolic (congestive) heart failure: Secondary | ICD-10-CM

## 2021-08-20 DIAGNOSIS — Z794 Long term (current) use of insulin: Secondary | ICD-10-CM

## 2021-08-20 NOTE — Patient Instructions (Signed)
Visit Information  Thank you for taking time to visit with me today. Please don't hesitate to contact me if I can be of assistance to you before our next scheduled telephone appointment.  Following are the goals we discussed today:   Goals Addressed             This Visit's Progress    Pharmacy Goals       Please check your blood sugar, keep a log of the results and bring this with you to your medical appointments.  Please check your home blood pressure, keep a log of the results and bring this with you to your medical appointments.  Our goal bad cholesterol, or LDL, is less than 70 . This is why it is important to continue taking your rosuvastatin.  Feel free to call me with any questions or concerns. I look forward to our next call!  [name], PharmD, [place] [phone]         Our next appointment is by telephone on 09/21/2021 at 10 am  Please call the care guide team at [phone] if you need to cancel or reschedule your appointment.    The patient's wife verbalized understanding of instructions, educational materials, and care plan provided today and DECLINED offer to receive copy of patient instructions, educational materials, and care plan.

## 2021-08-20 NOTE — Chronic Care Management (AMB) (Signed)
Chronic Care Management CCM Pharmacy Note  08/20/2021 Name:  Lucas Caldwell MRN:  [id number] DOB:  [birth year]   Subjective: Lucas Caldwell is an 88 y.o. year old male who is a primary patient of Lucas Hauser, DO.  The CCM team was consulted for assistance with disease management and care coordination needs.    Engaged with patient's spouse by telephone for follow up visit for pharmacy case management and/or care coordination services.   Objective:  Medications Reviewed Today     Reviewed by Rennis Petty, RPH-CPP (Pharmacist) on 08/20/21 at 1031  Med List Status: <None>   Medication Order Taking? Sig Documenting Provider Last Dose Status Informant  acetaminophen (TYLENOL) 500 MG tablet [id number]  Take 1,000 mg by mouth every 6 (six) hours as needed for moderate pain or headache.  [provider]  Active Spouse/Significant Other  Alcohol Swabs (B-D SINGLE USE SWABS REGULAR) PADS [id number]  Use to check blood sugar up to 2 times daily Lucas Hauser, DO  Active Spouse/Significant Other  allopurinol (ZYLOPRIM) 100 MG tablet [id number]  Take 1 tablet (100 mg total) by mouth daily. Lucas Gianotti, NP  Active   aspirin 81 MG chewable tablet [id number]  Chew 81 mg by mouth daily. [provider]  Active Spouse/Significant Other  bosutinib (BOSULIF) 100 MG tablet [id number]  Take 100 mg by mouth at bedtime. Take with food. [provider]  Active Spouse/Significant Other           Med Note Jeanie Cooks Sep 15, 2019  8:38 AM)    Cholecalciferol 25 MCG (1000 UT) capsule [id number]  Take 1,000 Units by mouth daily. [provider]  Active Spouse/Significant Other  famotidine (PEPCID) 40 MG tablet [id number]  Take 1 tablet (40 mg total) by mouth 2 (two) times daily with a meal. Lucas, [name], DO  Active   gabapentin (NEURONTIN) 300 MG capsule [id number]  Take 1 capsule (300 mg total) by mouth 3 (three)  times daily. Lucas Hauser, DO  Active   Insulin Glargine [facility] KWIKPEN) 100 UNIT/ML [id number] Yes Inject 20 Units into the skin daily. [provider] Taking Active   Insulin Pen Needle 31G X 5 MM MISC [id number]  Use to inject insulin daily. Lucas, [name], DO  Active   isosorbide mononitrate (IMDUR) 60 MG 24 hr tablet [id number]  Take 0.5 tablets (30 mg total) by mouth daily. Lucas Gianotti, NP  Active   JARDIANCE 10 MG TABS tablet [id number] Yes Take 1 tablet (10 mg total) by mouth daily with breakfast. Lucas Hauser, DO Taking Active   Lancets (ONETOUCH DELICA PLUS [id number]) [place] [id number]  Use to check blood sugar twice daily Lucas Hauser, DO  Active   levothyroxine (SYNTHROID) 25 MCG tablet [id number]  TAKE ONE TABLET BY MOUTH EVERY MORNING BEFORE BREAKFAST Lucas Hauser, DO  Active   losartan (COZAAR) 25 MG tablet [id number]  TAKE 1/2 TABLET(12.5 MG) BY MOUTH DAILY End, Lucas Gave, MD  Active   Menthol-Zinc Oxide (GOLD BOND EX) [id number]  Apply 1 application topically daily as needed (muscle pain). [provider]  Active Spouse/Significant Other  Multiple Vitamin (MULTIVITAMIN WITH MINERALS) TABS tablet [id number]  Take 1 tablet by mouth daily. [provider]  Active Spouse/Significant Other  nitroGLYCERIN (NITROSTAT) 0.4 MG SL tablet [id number]  Place 1 tablet (0.4 mg total) under the tongue every 5 (five) minutes x 3 doses as needed for chest pain. Lucas Caldwell  Jori Moll, NP  Active Spouse/Significant Other  ONETOUCH VERIO test strip [id number]  Check blood sugar up to 2 x daily Lucas Hauser, DO  Active   Polyethyl Glycol-Propyl Glycol (SYSTANE OP) [id number]  Place 1 drop into both eyes 3 (three) times daily as needed (dry/irritated eyes.).  [provider]  Active Spouse/Significant Other  polyethylene glycol (MIRALAX / GLYCOLAX) 17 g packet [id number]  Take 17 g by mouth daily  as needed for mild constipation.  [provider]  Active Spouse/Significant Other  ranolazine (RANEXA) 500 MG 12 hr tablet [id number]  TAKE ONE TABLET BY MOUTH TWICE DAILY End, Lucas Gave, MD  Active   rosuvastatin (CRESTOR) 5 MG tablet [id number]  Take 1 tablet (5 mg total) by mouth daily. End, Lucas Gave, MD  Active   tamsulosin (FLOMAX) 0.4 MG CAPS capsule [id number]  Take 1 capsule (0.4 mg total) by mouth daily after supper. Lucas Mu, PA-C  Active Spouse/Significant Other  torsemide (DEMADEX) 20 MG tablet [id number] Yes Take 1 tablet (20 mg total) by mouth as directed. TAKE 2 tablets in the AM and 1 tablet in the evening, every other day. Lucas Gianotti, NP Taking Active   traMADol Lucas Caldwell) 50 MG tablet [id number]  Take 1-2 tablets (50-100 mg total) by mouth at bedtime as needed. Lucas Hauser, DO  Active Spouse/Significant Other  warfarin (COUMADIN) 3 MG tablet [id number]  TAKE 1 TO 1 & 1/2 TABLETS BY MOUTH AS DIRECTED BY THE COUMADIN CLINIC Strength: 3 mg End, Lucas Gave, MD  Active             Pertinent Labs:   Lab Results  Component Value Date   HGBA1C 7.8 (H) 07/16/2021   Lab Results  Component Value Date   CHOL 163 07/06/2021   HDL 37 (L) 07/06/2021   LDLCALC 89 07/06/2021   TRIG 186 (H) 07/06/2021   CHOLHDL 4.4 07/06/2021   Lab Results  Component Value Date   CREATININE 1.42 (H) 08/17/2021   BUN 30 (H) 08/17/2021   NA 136 08/17/2021   K 4.2 08/17/2021   CL 101 08/17/2021   CO2 26 08/17/2021    SDOH:  (Social Determinants of Health) assessments and interventions performed:    [place]  Review of patient past medical history, allergies, medications, health status, including review of consultants reports, laboratory and other test data, was performed as part of comprehensive evaluation and provision of chronic care management services.   Care Plan : PharmD - Medication Management/Assistance  Updates made by Rennis Petty, RPH-CPP since 08/20/2021 12:00 AM     Problem: Disease Progression      Long-Range Goal: Disease Progression Prevented or Minimized   Start Date: 04/14/2020  Expected End Date: 07/13/2020  Recent Progress: On track  Priority: High  Note:   Current Barriers:  Chronic Disease Management support, education, and care coordination needs related to type 2 diabetes, CKD, HFrEF s/p CardioMEMS, hyperlipidemia, atrial fibrillation, hypertension, coronary artery disease, CHF, hypothyroidism, and CML Financial Note patient has Partial Extra Help/LIS Subsidy Patient enrolled in [place] patient assistance program for 2023 calendar year Patient denied for patient assistance for [place] from [facility] for 2022 calendar year Patient previously enrolled in [place] for [employer] copayment assistance, but eligibility expected to expire on 06/09/2021 as not in use (currently no copayment through Part D plan Limited vision  Pharmacist Clinical Goal(s):  Over the next 90 days, patient will verbalize ability to afford treatment regimen through collaboration with  PharmD and provider.   Interventions: 1:1 collaboration with Lucas Hauser, DO regarding development and update of comprehensive plan of care as evidenced by provider attestation and co-signature Inter-disciplinary care team collaboration (see longitudinal plan of care) Perform chart review. Patient seen for Office Visit with [facility] on 6/2. Provider advised patient: Continue current dose of torsemide at 40 mg in the morning and 20 mg in the afternoon and he will take an additional 20 mg as needed for weight gain of 2 pounds in 24 hours  Type 2 Diabetes: Current treatment: [name] 20 units daily Jardiance 10 mg once daily in morning Previous therapies tried: Trulicity (stopped due to concern could be contributing to diarrhea) Reports recent fasting blood sugars readings:   Morning  Fasting Blood Sugar   30 - May 183  31 - May 174  1 - June 172  2 - June 170  3 - June 142  4 - June 163  5 - June 134    Counsel on importance of having regular well-balanced meals, while controlling carbohydrate portion sizes Have counsel on signs of hypoglycemia and how to manage low blood sugar Exercise: reports patient walks with walker/cane ~10 minutes most days of the week Have discussed importance of fall prevention/consistently using walker Counsel to continue to monitor home blood sugar, keep a log of results, bring this record to medical appointments and call office sooner if needed for readings outside of established parameters or symptoms [name] denies having yet contacted [employer] [phone]) to arrange shipment of patient's diabetes testing supplies, but states that she will call today  CHF: Note patient followed by [facility] and the advanced heart failure clinic, including remotely with cardioMEMS Current treatment: Torsemide 20 mg - 2 tablets(40 mg) in the morning and 1 tablet (20 mg) in the afternoon Jardiance 10 mg daily Losartan 25 mg -  tablet (12.5 mg) daily Encourage patient to continue to weigh daily and keep log and continue to use cardioMEMS monitoring    Patient Goals/Self-Care Activities Over the next 90 days, patient will:  Check blood sugar, blood pressure and fluid status as directed by providers  Take medications, with assistance of wife, as directed Mrs. Warrick manages patient's medications using weekly pillbox as adherence tool Attends scheduled medical appointments  Follow Up Plan: Telephone follow up appointment with care management team member scheduled for: 09/21/2021 at 43 am       [name], PharmD, [place], [facility] [phone]

## 2021-08-22 ENCOUNTER — Other Ambulatory Visit: Payer: Self-pay | Admitting: *Deleted

## 2021-08-22 DIAGNOSIS — M25511 Pain in right shoulder: Secondary | ICD-10-CM | POA: Diagnosis not present

## 2021-08-22 DIAGNOSIS — M25512 Pain in left shoulder: Secondary | ICD-10-CM | POA: Diagnosis not present

## 2021-08-22 MED ORDER — TORSEMIDE 20 MG PO TABS: 20.0000 mg | ORAL_TABLET | ORAL | 3 refills | Status: DC

## 2021-08-24 DIAGNOSIS — M25512 Pain in left shoulder: Secondary | ICD-10-CM | POA: Diagnosis not present

## 2021-08-24 DIAGNOSIS — M25511 Pain in right shoulder: Secondary | ICD-10-CM | POA: Diagnosis not present

## 2021-08-27 ENCOUNTER — Telehealth: Payer: Medicare HMO

## 2021-08-28 DIAGNOSIS — M25511 Pain in right shoulder: Secondary | ICD-10-CM | POA: Diagnosis not present

## 2021-08-28 DIAGNOSIS — M25512 Pain in left shoulder: Secondary | ICD-10-CM | POA: Diagnosis not present

## 2021-08-29 ENCOUNTER — Ambulatory Visit (INDEPENDENT_AMBULATORY_CARE_PROVIDER_SITE_OTHER): Payer: Medicare HMO

## 2021-08-29 DIAGNOSIS — G4733 Obstructive sleep apnea (adult) (pediatric): Secondary | ICD-10-CM | POA: Diagnosis not present

## 2021-08-29 DIAGNOSIS — I480 Paroxysmal atrial fibrillation: Secondary | ICD-10-CM

## 2021-08-29 DIAGNOSIS — Z7901 Long term (current) use of anticoagulants: Secondary | ICD-10-CM

## 2021-08-29 LAB — POCT INR: INR: 2.2 (ref 2.0–3.0)

## 2021-08-29 NOTE — Patient Instructions (Signed)
Description   Continue dosage of warfarin of 1.5 TABLETS EVERY DAY EXCEPT 1 TABLETS ON MONDAYS & FRIDAYS.  Recheck INR in 6 weeks     

## 2021-08-30 ENCOUNTER — Encounter: Payer: Self-pay | Admitting: Physician Assistant

## 2021-08-30 ENCOUNTER — Ambulatory Visit (INDEPENDENT_AMBULATORY_CARE_PROVIDER_SITE_OTHER): Payer: Medicare HMO | Admitting: Physician Assistant

## 2021-08-30 VITALS — BP 132/89 | HR 98 | Ht 64.0 in | Wt 196.0 lb

## 2021-08-30 DIAGNOSIS — N3941 Urge incontinence: Secondary | ICD-10-CM | POA: Diagnosis not present

## 2021-08-30 DIAGNOSIS — R351 Nocturia: Secondary | ICD-10-CM | POA: Diagnosis not present

## 2021-08-30 DIAGNOSIS — N401 Enlarged prostate with lower urinary tract symptoms: Secondary | ICD-10-CM

## 2021-08-30 LAB — BLADDER SCAN AMB NON-IMAGING

## 2021-08-30 MED ORDER — MIRABEGRON ER 25 MG PO TB24: 25.0000 mg | ORAL_TABLET | Freq: Every day | ORAL | 3 refills | Status: DC

## 2021-08-30 NOTE — Progress Notes (Addendum)
08/30/2021 11:12 AM   Lucas Caldwell, Lucas Caldwell [id number]  CC: Chief Complaint  Patient presents with   Urinary Incontinence   HPI: Lucas Caldwell is a 88 y.o. male with PMH diabetes, OSA, BPH with LUTS on Flomax, and urge incontinence on Myrbetriq 25 mg daily who presents today for annual follow-up.   Today he reports he is still taking Flomax daily, but is no longer on Myrbetriq.  It is unclear when this medication was stopped.  His insurance has changed since last year and his prior insurer had reached out to him to discuss switching to an anticholinergic medicine alternative, which I recommended against.  He states he uses a CPAP sometimes.  Overall, he is mostly bothered by his urinary urgency and urge incontinence.  IPSS 14/unhappy as below.  PVR 64 mL.   IPSS     Row Name 08/30/21 1100         International Prostate Symptom Score   How often have you had the sensation of not emptying your bladder? About half the time     How often have you had to urinate less than every two hours? Less than half the time     How often have you found you stopped and started again several times when you urinated? Less than half the time     How often have you found it difficult to postpone urination? Less than half the time     How often have you had a weak urinary stream? Less than half the time     How often have you had to strain to start urination? Not at All     How many times did you typically get up at night to urinate? 3 Times     Total IPSS Score 14       Quality of Life due to urinary symptoms   If you were to spend the rest of your life with your urinary condition just the way it is now how would you feel about that? Unhappy             PMH: Past Medical History:  Diagnosis Date   Arthritis    "probably in his legs" (08/15/2016)   Blind right eye    BPH (benign prostatic hyperplasia)    Breast asymmetry    Left breast is larger, present for several years.    CAD (coronary artery disease)    a. Cath in the late 90's - reportedly ok;  b. 2014 s/p stenting x 2 @ [facility]; c 08/19/16 Cath/PCI with DES -> RCA, plan to treat LM 70% medically. Seen by surgery and felt to be too high risk for CABG; d. 08/2018 Cath: LM 70 (iFR 0.86), LAD 20ost, 40p, 50/16m D1 70, LCX patent stent, OM1 30, RCA patent stent, 436mSR, 40d, RPDA 30. PCWP 8. CO/CI 4.0/2.1; e. 03/2019 PCI to LAD (2.5x15 Resolute Onyx DES).   Chronic combined systolic (congestive) and diastolic (congestive) heart failure ([place]   a. Previously reduced EF-->50% by echo in 2012;  b. 06/2015 Echo: EF 50-55%  c. 07/2016 Echo: EF 45-50%; d. 12/2017 Echo: EF 55%; e. 08/2018 Echo: EF 35-40%; f. 04/2019 Echo: EF 30-35%; g. s/p cardiomems; h. 10/2020 Echo: EF 30-35%, mod-sev glob HK, gr1 DD.   Chronic kidney disease (CKD), stage IV (severe) (HCC)    CML (chronic myelocytic leukemia) (HCC)    GERD (gastroesophageal reflux disease)    GIB (gastrointestinal bleeding)    a. 12/2017 3  unit PRBC GIB in setting of coumadin-->Endo/colon multiple duodenal ulcers and a single bleeding ulcer in the proximal ascending colon status post hemostatic clipping x2.   Gout    Hyperlipemia    Hypertension    Hypothyroidism    Iron deficiency anemia    Ischemic cardiomyopathy    a. Previously reduced EF-->50% by echo in 2012;  b. 06/2015 Echo: EF 50-55%, Gr2 DD;  c. 07/2016 Echo: EF 45-50%; d. 12/2017 Echo: EF 55%, Gr1 DD, mild MR; e. 08/2018 Echo: EF 35-40%; f. 04/2019 Echo: EF 30-35%; g. 10/2020 Echo: EF 30-35%.   Migraine    "in the 1960s" (08/15/2016)   Obstructive sleep apnea    Orthostatic hypotension    a. 20222 -->carvedilol and hydralazine d/c'd.   PAF (paroxysmal atrial fibrillation) (HCC)    a. ? Dx 2014-->s/p DCCV;  b. CHA2DS2VASc = 6--> Coumadin.   Prostate cancer ([place])    RBBB    Type II diabetes mellitus [facility])     Surgical History: Past Surgical History:  Procedure Laterality Date   AMPUTATION TOE Right 02/23/2021    Procedure: AMPUTATION TOE;  Surgeon: Samara Deist, DPM;  Location: [facility] ORS;  Service: Podiatry;  Laterality: Right;   CATARACT EXTRACTION W/ INTRAOCULAR LENS  IMPLANT, BILATERAL Bilateral    COLONOSCOPY N/A 01/13/2018   Procedure: COLONOSCOPY;  Surgeon: Lin Landsman, MD;  Location: [facility] ENDOSCOPY;  Service: Gastroenterology;  Laterality: N/A;   COLONOSCOPY WITH PROPOFOL N/A 01/01/2018   Procedure: COLONOSCOPY WITH PROPOFOL;  Surgeon: Lin Landsman, MD;  Location[facility] END[facility]COPY;  Service: Gastroenterology;  Laterality: N/A;   CORONARY ANGIOPLASTY WITH STENT PLACEMENT  09/2012   2 stents   CORONARY STENT INTERVENTION N/A 08/19/2016   Procedure: Coronary Stent Intervention;  Surgeon: [name], MD;  Location: [place] CV LAB;  Service: Cardiovascular;  Laterality: N/A;   CORONARY STENT INTERVENTION N/A 1/Caldwell/2021   Procedure: CORONARY STENT INTERVENTION;  Surgeon: [name], MD;  Location: [place] CV LAB;  Service: Cardiovascular;  Laterality: N/A;   ESOPHAGEAL MANOMETRY N/A 12/16/2017   Procedure: ESOPHAGEAL MANOMETRY (EM);  Surgeon: Lin Landsman, MD;  Location: [facility] ENDOSCOPY;  Service: Gastroenterology;  Laterality: N/A;   ESOPHAGOGASTRODUODENOSCOPY N/A 12/20/2016   Procedure: ESOPHAGOGASTRODUODENOSCOPY (EGD);  Surgeon: Lin Landsman, MD;  Location: [facility] ENDOSCOPY;  Service: Gastroenterology;  Laterality: N/A;   ESOPHAGOGASTRODUODENOSCOPY N/A 01/13/2018   Procedure: ESOPHAGOGASTRODUODENOSCOPY (EGD);  Surgeon: Lin Landsman, MD;  Location[facility] ENDOSCOPY;  Service: Gastroenterology;  Laterality: N/A;   ESOPHAGOGASTRODUODENOSCOPY (EGD) WITH PROPOFOL N/A 11/03/2017   Procedure: ESOPHAGOGASTRODUODENOSCOPY (EGD) WITH PROPOFOL;  Surgeon: Lin Landsman, MD;  Location: [facility] ENDOSCOPY;  Service: Gastroenterology;  Laterality: N/A;   EYE SURGERY     HERNIA REPAIR     "[place]"   LEFT HEART CATH AND CORONARY ANGIOGRAPHY N/A 08/14/2016   Procedure:  Left Heart Cath and Coronary Angiography;  Surgeon: [name], MD;  Location: [place] CV LAB;  Service: Cardiovascular;  Laterality: N/A;   PRESSURE SENSOR/CARDIOMEMS N/A 09/23/2019   Procedure: PRESSURE SENSOR/CARDIOMEMS;  Surgeon: Jolaine Artist, MD;  Location: [place] CV LAB;  Service: Cardiovascular;  Laterality: N/A;   RIGHT HEART CATH N/A 09/23/2019   Procedure: RIGHT HEART CATH;  Surgeon: Jolaine Artist, MD;  Location: [place] CV LAB;  Service: Cardiovascular;  Laterality: N/A;   RIGHT/LEFT HEART CATH AND CORONARY ANGIOGRAPHY N/A 09/01/2018   Procedure: RIGHT/LEFT HEART CATH AND CORONARY ANGIOGRAPHY;  Surgeon: [name], MD;  Location: [place] CV LAB;  Service: Cardiovascular;  Laterality: N/A;   RIGHT/LEFT HEART CATH AND CORONARY ANGIOGRAPHY N/A 1/[place]/2021   Procedure: RIGHT/LEFT HEART CATH AND CORONARY ANGIOGRAPHY;  Surgeon: [name], MD;  Location: [place] CV LAB;  Service: Cardiovascular;  Laterality: N/A;   TOE AMPUTATION Right    "big toe"    Home Medications:  Allergies as of 08/30/2021       Reactions   Clopidogrel Anaphylaxis, Other (See Comments)   altered mental status;  (electrolytes "out of wack" and confusion per family; THEY DO NOT REMEMBER ANY OTHER REACTION)- MSB 10/10/15   Ciprofloxacin Itching   Beta Adrenergic Blockers    Nightmares   Mirtazapine Diarrhea   Bad dreams   Spironolactone Other (See Comments)   gynecomastia        Medication List        Accurate as of August 30, 2021 11:12 AM. If you have any questions, ask your nurse or doctor.          acetaminophen 500 MG tablet Commonly known as: TYLENOL Take 1,000 mg by mouth every 6 (six) hours as needed for moderate pain or headache.   allopurinol 100 MG tablet Commonly known as: ZYLOPRIM Take 1 tablet (100 mg total) by mouth daily.   aspirin 81 MG chewable tablet Chew 81 mg by mouth daily.   B-D SINGLE USE SWABS REGULAR Pads Use to  check blood sugar up to 2 times daily   [name] KwikPen 100 UNIT/ML Inject 20 Units into the skin daily.   bosutinib 100 MG tablet Commonly known as: BOSULIF Take 100 mg by mouth at bedtime. Take with food.   Cholecalciferol 25 MCG (1000 UT) capsule Take 1,000 Units by mouth daily.   famotidine 40 MG tablet Commonly known as: PEPCID Take 1 tablet (40 mg total) by mouth 2 (two) times daily with a meal.   gabapentin 300 MG capsule Commonly known as: NEURONTIN Take 1 capsule (300 mg total) by mouth 3 (three) times daily.   GOLD BOND EX Apply 1 application topically daily as needed (muscle pain).   Insulin Pen Needle 31G X 5 MM Misc Use to inject insulin daily.   isosorbide mononitrate 60 MG 24 hr tablet Commonly known as: IMDUR Take 0.5 tablets (30 mg total) by mouth daily.   Jardiance 10 MG Tabs tablet Generic drug: empagliflozin Take 1 tablet (10 mg total) by mouth daily with breakfast.   levothyroxine 25 MCG tablet Commonly known as: SYNTHROID TAKE ONE TABLET BY MOUTH EVERY MORNING BEFORE BREAKFAST   losartan 25 MG tablet Commonly known as: COZAAR TAKE 1/2 TABLET(12.5 MG) BY MOUTH DAILY   multivitamin with minerals Tabs tablet Take 1 tablet by mouth daily.   nitroGLYCERIN 0.4 MG SL tablet Commonly known as: NITROSTAT Place 1 tablet (0.4 mg total) under the tongue every 5 (five) minutes x 3 doses as needed for chest pain.   ONE TOUCH ULTRA 2 w/Device Kit SMARTSIG:Via Meter   OneTouch Delica Plus [id number] Misc Use to check blood sugar twice daily   OneTouch Verio test strip Generic drug: glucose blood Check blood sugar up to 2 x daily   OT ULTRA/FASTTK CNTRL SOLN Soln   polyethylene glycol 17 g packet Commonly known as: MIRALAX / GLYCOLAX Take 17 g by mouth daily as needed for mild constipation.   ranolazine 500 MG 12 hr tablet Commonly known as: RANEXA TAKE ONE TABLET BY MOUTH TWICE DAILY   rosuvastatin 5 MG tablet Commonly known as:  CRESTOR Take 1 tablet (5  mg total) by mouth daily.   SYSTANE OP Place 1 drop into both eyes 3 (three) times daily as needed (dry/irritated eyes.).   tamsulosin 0.4 MG Caps capsule Commonly known as: FLOMAX Take 1 capsule (0.4 mg total) by mouth daily after supper.   torsemide 20 MG tablet Commonly known as: DEMADEX Take 1 tablet (20 mg total) by mouth as directed. TAKE 2 tablets in the AM and 1 tablet in the evening, every other day. May take additional 1 tablet (20 mg) as needed for weight greater than 198 pounds.   traMADol 50 MG tablet Commonly known as: ULTRAM Take 1-2 tablets (50-100 mg total) by mouth at bedtime as needed.   warfarin 3 MG tablet Commonly known as: COUMADIN Take as directed by the anticoagulation clinic. If you are unsure how to take this medication, talk to your nurse or doctor. Original instructions: TAKE 1 TO 1 & 1/2 TABLETS BY MOUTH AS DIRECTED BY THE COUMADIN CLINIC Strength: 3 mg        Allergies:  Allergies  Allergen Reactions   Clopidogrel Anaphylaxis and Other (See Comments)    altered mental status;  (electrolytes "out of wack" and confusion per family; THEY DO NOT REMEMBER ANY OTHER REACTION)- MSB 10/10/15   Ciprofloxacin Itching   Beta Adrenergic Blockers     Nightmares   Mirtazapine Diarrhea    Bad dreams    Spironolactone Other (See Comments)    gynecomastia    Family History: Family History  Problem Relation Age of Onset   Brain cancer Father    Diabetes Sister    Heart attack Sister     Social History:   reports that he quit smoking about 48 years ago. His smoking use included cigarettes. He started smoking about 68 years ago. He has a 35.00 pack-year smoking history. He has quit using smokeless tobacco. He reports that he does not drink alcohol and does not use drugs.  Physical Exam: BP 132/89 (BP Location: Left Arm, Patient Position: Sitting, Cuff Size: Large)   Pulse 98   Ht _0  (1.626 m)   Wt 196 lb (88.9 kg)   BMI  33.64 kg/m   Constitutional:  Alert and oriented, no acute distress, nontoxic appearing HEENT: [place], AT Cardiovascular: No clubbing, cyanosis, or edema Respiratory: Normal respiratory effort, no increased work of breathing Skin: No rashes, bruises or suspicious lesions Neurologic: Grossly intact, no focal deficits, moving all 4 extremities Psychiatric: Normal mood and affect  Laboratory Data: Results for orders placed or performed in visit on 08/30/21  Bladder Scan (Post Void Residual) in office  Result Value Ref Range   Scan Result 5m    *Note: Due to a large number of results and/or encounters for the requested time period, some results have not been displayed. A complete set of results can be found in Results Review.   Assessment & Plan:   1. Urge incontinence of urine His most bothersome urinary symptom.  [name] off Myrbetriq.  We will refill today.  We discussed that anticholinergic medications carry increased risk for side effects and I do not recommend them in this patient. - Bladder Scan (Post Void Residual) in office - mirabegron ER (MYRBETRIQ) 25 MG TB24 tablet; Take 1 tablet (25 mg total) by mouth daily.  Dispense: 90 tablet; Refill: 3  2. Benign prostatic hyperplasia with nocturia Counseled him to continue Flomax and discussed the role of untreated OSA on nocturia.  Encouraged nightly CPAP use.  Return in about 1 year (  around 08/31/2022) for Annual IPSS, PVR.  Debroah Loop, PA-C  [facility] [address], [place], [place] [address] [phone]

## 2021-09-03 ENCOUNTER — Ambulatory Visit (INDEPENDENT_AMBULATORY_CARE_PROVIDER_SITE_OTHER): Payer: Medicare HMO

## 2021-09-03 DIAGNOSIS — M25511 Pain in right shoulder: Secondary | ICD-10-CM | POA: Diagnosis not present

## 2021-09-03 DIAGNOSIS — Z0000 Encounter for general adult medical examination without abnormal findings: Secondary | ICD-10-CM

## 2021-09-03 DIAGNOSIS — M25512 Pain in left shoulder: Secondary | ICD-10-CM | POA: Diagnosis not present

## 2021-09-03 NOTE — Progress Notes (Signed)
Subjective:   I connected with  Lucas Caldwell on 09/03/21 by a audio enabled telemedicine application and verified that I am speaking with the correct person using two identifiers.  Patient Location: Home  Provider Location: Office/Clinic  I discussed the limitations of evaluation and management by telemedicine. The patient expressed understanding and agreed to proceed.       Lucas Caldwell is a 88 y.o. male who presents for Medicare Annual/Subsequent preventive examination.  Review of Systems    Per HPI unless specifically indicated below        Objective:    Today's Vitals   09/03/21 1543  PainSc: 1    There is no height or weight on file to calculate BMI.     02/23/2021    6:42 AM 02/22/2021   11:43 AM 02/21/2021   11:25 AM 11/02/2020    8:58 AM 08/16/2020   11:56 AM 06/06/2020    2:16 PM 02/17/2020    9:27 AM  Advanced Directives  Does Patient Have a Medical Advance Directive? No No No No No No No  Would patient like information on creating a medical advance directive?  No - Patient declined         Current Medications (verified) Outpatient Encounter Medications as of 09/03/2021  Medication Sig   acetaminophen (TYLENOL) 500 MG tablet Take 1,000 mg by mouth every 6 (six) hours as needed for moderate pain or headache.    Alcohol Swabs (B-D SINGLE USE SWABS REGULAR) PADS Use to check blood sugar up to 2 times daily   allopurinol (ZYLOPRIM) 100 MG tablet Take 1 tablet (100 mg total) by mouth daily.   aspirin 81 MG chewable tablet Chew 81 mg by mouth daily.   Blood Glucose Calibration (OT ULTRA/FASTTK CNTRL SOLN) SOLN    Blood Glucose Monitoring Suppl (ONE TOUCH ULTRA 2) w/Device KIT SMARTSIG:Via Meter   bosutinib (BOSULIF) 100 MG tablet Take 100 mg by mouth at bedtime. Take with food.   Cholecalciferol 25 MCG (1000 UT) capsule Take 1,000 Units by mouth daily.   famotidine (PEPCID) 40 MG tablet Take 1 tablet (40 mg total) by mouth 2 (two) times daily with a meal.    gabapentin (NEURONTIN) 300 MG capsule Take 1 capsule (300 mg total) by mouth 3 (three) times daily.   Insulin Glargine ([name] KWIKPEN) 100 UNIT/ML Inject 20 Units into the skin daily.   Insulin Pen Needle 31G X 5 MM MISC Use to inject insulin daily.   isosorbide mononitrate (IMDUR) 60 MG 24 hr tablet Take 0.5 tablets (30 mg total) by mouth daily.   JARDIANCE 10 MG TABS tablet Take 1 tablet (10 mg total) by mouth daily with breakfast.   Lancets (ONETOUCH DELICA PLUS [id number]) MISC Use to check blood sugar twice daily   levothyroxine (SYNTHROID) 25 MCG tablet TAKE ONE TABLET BY MOUTH EVERY MORNING BEFORE BREAKFAST   losartan (COZAAR) 25 MG tablet TAKE 1/2 TABLET(12.5 MG) BY MOUTH DAILY   Menthol-Zinc Oxide (GOLD BOND EX) Apply 1 application topically daily as needed (muscle pain).   mirabegron ER (MYRBETRIQ) 25 MG TB24 tablet Take 1 tablet (25 mg total) by mouth daily.   Multiple Vitamin (MULTIVITAMIN WITH MINERALS) TABS tablet Take 1 tablet by mouth daily.   nitroGLYCERIN (NITROSTAT) 0.4 MG SL tablet Place 1 tablet (0.4 mg total) under the tongue every 5 (five) minutes x 3 doses as needed for chest pain.   ONETOUCH VERIO test strip Check blood sugar up to 2 x daily  Polyethyl Glycol-Propyl Glycol (SYSTANE OP) Place 1 drop into both eyes 3 (three) times daily as needed (dry/irritated eyes.).    polyethylene glycol (MIRALAX / GLYCOLAX) 17 g packet Take 17 g by mouth daily as needed for mild constipation.    ranolazine (RANEXA) 500 MG 12 hr tablet TAKE ONE TABLET BY MOUTH TWICE DAILY   rosuvastatin (CRESTOR) 5 MG tablet Take 1 tablet (5 mg total) by mouth daily.   tamsulosin (FLOMAX) 0.4 MG CAPS capsule Take 1 capsule (0.4 mg total) by mouth daily after supper.   torsemide (DEMADEX) 20 MG tablet Take 1 tablet (20 mg total) by mouth as directed. TAKE 2 tablets in the AM and 1 tablet in the evening, every other day. May take additional 1 tablet (20 mg) as needed for weight greater than 198  pounds.   traMADol (ULTRAM) 50 MG tablet Take 1-2 tablets (50-100 mg total) by mouth at bedtime as needed.   warfarin (COUMADIN) 3 MG tablet TAKE 1 TO 1 & 1/2 TABLETS BY MOUTH AS DIRECTED BY THE COUMADIN CLINIC Strength: 3 mg   No facility-administered encounter medications on file as of 09/03/2021.    Allergies (verified) Clopidogrel, Ciprofloxacin, Beta adrenergic blockers, Mirtazapine, and Spironolactone   History: Past Medical History:  Diagnosis Date   Arthritis    "probably in his legs" (08/15/2016)   Blind right eye    BPH (benign prostatic hyperplasia)    Breast asymmetry    Left breast is larger, present for several years.   CAD (coronary artery disease)    a. Cath in the late 90's - reportedly ok;  b. 2014 s/p stenting x 2 @ [facility]; c 08/19/16 Cath/PCI with DES -> RCA, plan to treat LM 70% medically. Seen by surgery and felt to be too high risk for CABG; d. 08/2018 Cath: LM 70 (iFR 0.86), LAD 20ost, 40p, 50/63m D1 70, LCX patent stent, OM1 30, RCA patent stent, 427mSR, 40d, RPDA 30. PCWP 8. CO/CI 4.0/2.1; e. 03/2019 PCI to LAD (2.5x15 Resolute Onyx DES).   Chronic combined systolic (congestive) and diastolic (congestive) heart failure ([place]   a. Previously reduced EF-->50% by echo in 2012;  b. 06/2015 Echo: EF 50-55%  c. 07/2016 Echo: EF 45-50%; d. 12/2017 Echo: EF 55%; e. 08/2018 Echo: EF 35-40%; f. 04/2019 Echo: EF 30-35%; g. s/p cardiomems; h. 10/2020 Echo: EF 30-35%, mod-sev glob HK, gr1 DD.   Chronic kidney disease (CKD), stage IV (severe) (HCC)    CML (chronic myelocytic leukemia) (HCC)    GERD (gastroesophageal reflux disease)    GIB (gastrointestinal bleeding)    a. 12/2017 3 unit PRBC GIB in setting of coumadin-->Endo/colon multiple duodenal ulcers and a single bleeding ulcer in the proximal ascending colon status post hemostatic clipping x2.   Gout    Hyperlipemia    Hypertension    Hypothyroidism    Iron deficiency anemia    Ischemic cardiomyopathy    a. Previously reduced  EF-->50% by echo in 2012;  b. 06/2015 Echo: EF 50-55%, Gr2 DD;  c. 07/2016 Echo: EF 45-50%; d. 12/2017 Echo: EF 55%, Gr1 DD, mild MR; e. 08/2018 Echo: EF 35-40%; f. 04/2019 Echo: EF 30-35%; g. 10/2020 Echo: EF 30-35%.   Migraine    "in the 1960s" (08/15/2016)   Obstructive sleep apnea    Orthostatic hypotension    a. 20222 -->carvedilol and hydralazine d/c'd.   PAF (paroxysmal atrial fibrillation) (HCC)    a. ? Dx 2014-->s/p DCCV;  b. CHA2DS2VASc = 6--> Coumadin.  Prostate cancer ([place])    RBBB    Type II diabetes mellitus [facility])    Past Surgical History:  Procedure Laterality Date   AMPUTATION TOE Right 02/23/2021   Procedure: AMPUTATION TOE;  Surgeon: Samara Deist, DPM;  Location: [facility] ORS;  Service: Podiatry;  Laterality: Right;   CATARACT EXTRACTION W/ INTRAOCULAR LENS  IMPLANT, BILATERAL Bilateral    COLONOSCOPY N/A 01/13/2018   Procedure: COLONOSCOPY;  Surgeon: Lin Landsman, MD;  Location: [facility] ENDOSCOPY;  Service: Gastroenterology;  Laterality: N/A;   COLONOSCOPY WITH PROPOFOL N/A 01/01/2018   Procedure: COLONOSCOPY WITH PROPOFOL;  Surgeon: Lin Landsman, MD;  Location[facility] ENDOSCOPY;  Service: Gastroenterology;  Laterality: N/A;   CORONARY ANGIOPLASTY WITH STENT PLACEMENT  09/2012   2 stents   CORONARY STENT INTERVENTION N/A 08/19/2016   Procedure: Coronary Stent Intervention;  Surgeon: [name], MD;  Location: [place] CV LAB;  Service: Cardiovascular;  Laterality: N/A;   CORONARY STENT INTERVENTION N/A 04/13/2019   Procedure: CORONARY STENT INTERVENTION;  Surgeon: [name], MD;  Location: [place] CV LAB;  Service: Cardiovascular;  Laterality: N/A;   ESOPHAGEAL MANOMETRY N/A 12/16/2017   Procedure: ESOPHAGEAL MANOMETRY (EM);  Surgeon: Lin Landsman, MD;  Location: [facility] ENDOSCOPY;  Service: Gastroenterology;  Laterality: N/A;   ESOPHAGOGASTRODUODENOSCOPY N/A 12/20/2016   Procedure: ESOPHAGOGASTRODUODENOSCOPY (EGD);  Surgeon: Lin Landsman,  MD;  Location: [facility] ENDOSCOPY;  Service: Gastroenterology;  Laterality: N/A;   ESOPHAGOGASTRODUODENOSCOPY N/A 01/13/2018   Procedure: ESOPHAGOGASTRODUODENOSCOPY (EGD);  Surgeon: Lin Landsman, MD;  Location: [facility] ENDOSCOPY;  Service: Gastroenterology;  Laterality: N/A;   ESOPHAGOGASTRODUODENOSCOPY (EGD) WITH PROPOFOL N/A 11/03/2017   Procedure: ESOPHAGOGASTRODUODENOSCOPY (EGD) WITH PROPOFOL;  Surgeon: Lin Landsman, MD;  Location[facility] END[facility]COPY;  Service: Gastroenterology;  Laterality: N/A;   EYE SURGERY     HERNIA REPAIR     "navel"   LEFT HEART CATH AND CORONARY ANGIOGRAPHY N/A 08/14/2016   Procedure: Left Heart Cath and Coronary Angiography;  Surgeon: [name], MD;  Location: [place] CV LAB;  Service: Cardiovascular;  Laterality: N/A;   PRESSURE SENSOR/CARDIOMEMS N/A 09/23/2019   Procedure: PRESSURE SENSOR/CARDIOMEMS;  Surgeon: Jolaine Artist, MD;  Location: [place] CV LAB;  Service: Cardiovascular;  Laterality: N/A;   RIGHT HEART CATH N/A 09/23/2019   Procedure: RIGHT HEART CATH;  Surgeon: Jolaine Artist, MD;  Location: [place] CV LAB;  Service: Cardiovascular;  Laterality: N/A;   RIGHT/LEFT HEART CATH AND CORONARY ANGIOGRAPHY N/A 09/01/2018   Procedure: RIGHT/LEFT HEART CATH AND CORONARY ANGIOGRAPHY;  Surgeon: [name], MD;  Location: [place] CV LAB;  Service: Cardiovascular;  Laterality: N/A;   RIGHT/LEFT HEART CATH AND CORONARY ANGIOGRAPHY N/A 04/13/2019   Procedure: RIGHT/LEFT HEART CATH AND CORONARY ANGIOGRAPHY;  Surgeon: [name], MD;  Location: [place] CV LAB;  Service: Cardiovascular;  Laterality: N/A;   TOE AMPUTATION Right    "big toe"   Family History  Problem Relation Age of Onset   Brain cancer Father    Diabetes Sister    Heart attack Sister    Social History   Socioeconomic History   Marital status: Married    Spouse name: Yerachmiel Spinney   Number of children: 2   Years of education: Not on file    Highest education level: 7th grade  Occupational History   Occupation: Retired    Comment: 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  Tobacco Use   Smoking status: Former    Packs/day: 1.75    Years: 20.00  Total pack years: 35.00    Types: Cigarettes    Start date: 100    Quit date: 62    Years since quitting: 48.4   Smokeless tobacco: Former  Scientific laboratory technician Use: Never used  Substance and Sexual Activity   Alcohol use: No    Comment: previously drank heavily - quit 1979.   Drug use: No   Sexual activity: Never  Other Topics Concern   Not on file  Social History Narrative   Working on transportation truck part time    Social Determinants of Health   Financial Resource Strain: Low Risk  (09/03/2021)   Overall Financial Resource Strain (CARDIA)    Difficulty of Paying Living Expenses: Not hard at all  Food Insecurity: No Food Insecurity (09/03/2021)   Hunger Vital Sign    Worried About Running Out of Food in the Last Year: Never true    Ran Out of Food in the Last Year: Never true  Transportation Needs: No Transportation Needs (09/03/2021)   PRAPARE - Hydrologist (Medical): No    Lack of Transportation (Non-Medical): No  Physical Activity: Inactive (10/02/2020)   Exercise Vital Sign    Days of Exercise per Week: 0 days    Minutes of Exercise per Session: 0 min  Stress: No Stress Concern Present (09/03/2021)   [place]    Feeling of Stress : Only a little  Social Connections: Socially Integrated (09/03/2021)   Social Connection and Isolation Panel [NHANES]    Frequency of Communication with Friends and Family: More than three times a week    Frequency of Social Gatherings with Friends and Family: More than three times a week    Attends Religious Services: More than 4 times per year    Active Member of [employer] or Organizations: Yes    Attends Music therapist: More  than 4 times per year    Marital Status: Married    Tobacco Counseling Counseling given: Not Answered   Clinical Intake:  Pre-visit preparation completed: No  Pain : 0-10 Pain Score: 1  Pain Location: Foot Pain Orientation: Left Pain Descriptors / Indicators: Nagging Pain Onset: More than a month ago Pain Frequency: Intermittent     Nutritional Status: BMI > 30  Obese Diabetes: Yes CBG done?: No Did pt. bring in CBG monitor from home?: No  How often do you need to have someone help you when you read instructions, pamphlets, or other written materials from your doctor or pharmacy?: 1 - Never  Diabetic?Nutrition Risk Assessment:  Has the patient had any N/V/D within the last 2 months?  No  Does the patient have any non-healing wounds?  No  Has the patient had any unintentional weight loss or weight gain?  No   Diabetes:  Is the patient diabetic?  Yes  If diabetic, was a CBG obtained today?  No  Did the patient bring in their glucometer from home?  No  How often do you monitor your CBG's? Daily  .   Financial Strains and Diabetes Management:  Are you having any financial strains with the device, your supplies or your medication? No .  Does the patient want to be seen by Chronic Care Management for management of their diabetes?  No  Would the patient like to be referred to a Nutritionist or for Diabetic Management?  No   Diabetic Exams:  Diabetic Eye Exam: Completed 05/01/2021 Diabetic  Foot Exam: Completed 11/23/2020    Interpreter Needed?: No  Information entered by :: Donnie Mesa, [place]   Activities of Daily Living    09/03/2021    3:42 PM 02/24/2021    1:07 PM  In your present state of health, do you have any difficulty performing the following activities:  Hearing? 1   Vision? 1   Difficulty concentrating or making decisions? 0   Walking or climbing stairs? 1   Dressing or bathing? 0   Doing errands, shopping? 0 0    Patient Care  Team: [name], DO as PCP - General (Family Medicine) End, [name], MD as PCP - Cardiology (Cardiology) Curley Spice, [name], RPH-CPP as Pharmacist [name] [name], RN as Case Manager (General Practice) Tat, Eustace Quail, DO as Consulting Physician (Neurology) Debroah Loop, [place] [facility]   Indicate any recent Medical Services you may have received from other than [facility] providers in the past year (date may be approximate). Pt hospitalized at [facility] on 02/21/2021 -02/24/2021 for diabetic foot ulcer.     Assessment:   This is a routine wellness examination for Lucas Caldwell.  Hearing/Vision screen Bilateral hearing loss, hearing aid   Dietary issues and exercise activities discussed: Current Exercise Habits: Structured exercise class, Type of exercise: strength training/weights, Time (Minutes): 15, Frequency (Times/Week): 7, Weekly Exercise (Minutes/Week): 105, Intensity: Mild, Exercise limited by: None identified   Goals Addressed             This Visit's Progress    Stay Active and Independent-Low Back Pain         Depression Screen    09/03/2021    3:46 PM 04/13/2021   10:57 AM 10/02/2020    3:43 PM 07/28/2020    1:57 PM 12/22/2019    3:54 PM 10/25/2019    1:53 PM 09/09/2019    1:53 PM  PHQ 2/9 Scores  PHQ - 2 Score 0 0 0 0 0 2 1  PHQ- 9 Score  '2  2  6 5    ' Fall Risk    09/03/2021    3:41 PM 04/13/2021   10:56 AM 11/02/2020    8:58 AM 07/28/2020    1:57 PM 04/13/2020   10:39 AM  Fall Risk   Falls in the past year? 0 0 1 0 1  Comment     Fell because of dizziness but no injury  Number falls in past yr: 0 0 0  1  Injury with Fall? 0 0 0  0  Risk for fall due to : No Fall Risks Impaired balance/gait;Impaired mobility     Follow up Falls evaluation completed Falls evaluation completed  Falls evaluation completed     FALL RISK PREVENTION PERTAINING TO THE HOME:  Any stairs in or around the home? No  If so, are there any without handrails?  No stairs   Home free of loose throw rugs in walkways, pet beds, electrical cords, etc? Yes  Adequate lighting in your home to reduce risk of falls? Yes   ASSISTIVE DEVICES UTILIZED TO PREVENT FALLS:  Life alert? Yes  Use of a cane, walker or w/c? Yes  Grab bars in the bathroom? Yes  Shower chair or bench in shower? No  Elevated toilet seat or a handicapped toilet? Yes    Cognitive Function:    11/18/2017    4:01 PM  MMSE - Mini Mental State Exam  Orientation to time 5  Orientation to Place 5  Registration 3  Attention/ Calculation  5  Recall 2  Language- name 2 objects 2  Language- repeat 1  Language- follow 3 step command 3  Language- read & follow direction 1  Write a sentence 1  Copy design 0  Total score 28        09/03/2021    3:46 PM  6CIT Screen  What Year? 0 points  What month? 0 points  What time? 0 points  Count back from 20 0 points  Months in reverse 2 points  Repeat phrase 10 points  Total Score 12 points    Immunizations Immunization History  Administered Date(s) Administered   Fluad Quad(high Dose 65+) 12/11/2020   Influenza, High Dose Seasonal PF 12/29/2014, 12/19/2016, 12/22/2017   Influenza, Seasonal, Injecte, Preservative Fre 01/13/2007, 12/13/2008, 01/30/2010, 12/26/2011   Influenza,inj,Quad PF,6+ Mos 12/25/2012, 12/09/2015, 11/26/2018, 12/15/2019   PFIZER(Purple Top)SARS-COV-2 Vaccination 05/13/2019, 06/08/2019, 12/15/2019, 05/19/2020   PPD Test 09/25/2010   Pneumococcal Conjugate-13 01/31/2014   Pneumococcal Polysaccharide-23 06/20/2008, 09/02/2010    TDAP status: Due, Education has been provided regarding the importance of this vaccine. Advised may receive this vaccine at local pharmacy or Health Dept. Aware to provide a copy of the vaccination record if obtained from local pharmacy or Health Dept. Verbalized acceptance and understanding.  Flu Vaccine status: Up to date  Pneumococcal vaccine status: Up to date  Covid-19 vaccine status:  Completed vaccines  Qualifies for Shingles Vaccine? Yes   Zostavax completed No   Shingrix Completed?: No.    Education has been provided regarding the importance of this vaccine. Patient has been advised to call insurance company to determine out of pocket expense if they have not yet received this vaccine. Advised may also receive vaccine at local pharmacy or Health Dept. Verbalized acceptance and understanding.  Screening Tests Health Maintenance  Topic Date Due   TETANUS/TDAP  Never done   Zoster Vaccines- Shingrix (1 of 2) Never done   COVID-19 Vaccine (5 - Booster for Pfizer series) 07/14/2020   INFLUENZA VACCINE  10/16/2021   FOOT EXAM  11/23/2021   HEMOGLOBIN A1C  01/16/2022   OPHTHALMOLOGY EXAM  05/01/2022   Pneumonia Vaccine 32+ Years old  Completed   HPV VACCINES  Aged Out    Health Maintenance  Health Maintenance Due  Topic Date Due   TETANUS/TDAP  Never done   Zoster Vaccines- Shingrix (1 of 2) Never done   COVID-19 Vaccine (5 - Booster for Pfizer series) 07/14/2020    Colon Cancer Screening: No longer required   Lung Cancer Screening: (Low Dose CT Chest recommended if Age 50-80 years, 30 pack-year currently smoking OR have quit w/in 15years.) does not qualify.   Lung Cancer Screening Referral: does not qualify   Additional Screening:  Hepatitis C Screening: does not qualify;  Vision Screening: Recommended annual ophthalmology exams for early detection of glaucoma and other disorders of the eye. Is the patient up to date with their annual eye exam?  Yes  Who is the provider or what is the name of the office in which the patient attends annual eye exams? [facility]  If pt is not established with a provider, would they like to be referred to a provider to establish care? No .   Dental Screening: Recommended annual dental exams for proper oral hygiene  Community Resource Referral / Chronic Care Management: CRR required this visit?  No   CCM required  this visit?  No      Plan:     I have personally reviewed  and noted the following in the patient's chart:   Medical and social history Use of alcohol, tobacco or illicit drugs  Current medications and supplements including opioid prescriptions. Patient is not currently taking opioid prescriptions. Functional ability and status Nutritional status Physical activity Advanced directives List of other physicians Hospitalizations, surgeries, and ER visits in previous 12 months Vitals Screenings to include cognitive, depression, and falls Referrals and appointments  In addition, I have reviewed and discussed with patient certain preventive protocols, quality metrics, and best practice recommendations. A written personalized care plan for preventive services as well as general preventive health recommendations were provided to patient.     Lucas Caldwell, [place]   09/03/2021

## 2021-09-03 NOTE — Patient Instructions (Signed)
Health Maintenance, Male Adopting a healthy lifestyle and getting preventive care are important in promoting health and wellness. Ask your health care provider about: The right schedule for you to have regular tests and exams. Things you can do on your own to prevent diseases and keep yourself healthy. What should I know about diet, weight, and exercise? Eat a healthy diet  Eat a diet that includes plenty of vegetables, fruits, low-fat dairy products, and lean protein. Do not eat a lot of foods that are high in solid fats, added sugars, or sodium. Maintain a healthy weight Body mass index (BMI) is a measurement that can be used to identify possible weight problems. It estimates body fat based on height and weight. Your health care provider can help determine your BMI and help you achieve or maintain a healthy weight. Get regular exercise Get regular exercise. This is one of the most important things you can do for your health. Most adults should: Exercise for at least 150 minutes each week. The exercise should increase your heart rate and make you sweat (moderate-intensity exercise). Do strengthening exercises at least twice a week. This is in addition to the moderate-intensity exercise. Spend less time sitting. Even light physical activity can be beneficial. Watch cholesterol and blood lipids Have your blood tested for lipids and cholesterol at 86 years of age, then have this test every 5 years. You may need to have your cholesterol levels checked more often if: Your lipid or cholesterol levels are high. You are older than 86 years of age. You are at high risk for heart disease. What should I know about cancer screening? Many types of cancers can be detected early and may often be prevented. Depending on your health history and family history, you may need to have cancer screening at various ages. This may include screening for: Colorectal cancer. Prostate cancer. Skin cancer. Lung  cancer. What should I know about heart disease, diabetes, and high blood pressure? Blood pressure and heart disease High blood pressure causes heart disease and increases the risk of stroke. This is more likely to develop in people who have high blood pressure readings or are overweight. Talk with your health care provider about your target blood pressure readings. Have your blood pressure checked: Every 3-5 years if you are 18-39 years of age. Every year if you are 40 years old or older. If you are between the ages of 65 and 75 and are a current or former smoker, ask your health care provider if you should have a one-time screening for abdominal aortic aneurysm (AAA). Diabetes Have regular diabetes screenings. This checks your fasting blood sugar level. Have the screening done: Once every three years after age 45 if you are at a normal weight and have a low risk for diabetes. More often and at a younger age if you are overweight or have a high risk for diabetes. What should I know about preventing infection? Hepatitis B If you have a higher risk for hepatitis B, you should be screened for this virus. Talk with your health care provider to find out if you are at risk for hepatitis B infection. Hepatitis C Blood testing is recommended for: Everyone born from 1945 through 1965. Anyone with known risk factors for hepatitis C. Sexually transmitted infections (STIs) You should be screened each year for STIs, including gonorrhea and chlamydia, if: You are sexually active and are younger than 86 years of age. You are older than 86 years of age and your   health care provider tells you that you are at risk for this type of infection. Your sexual activity has changed since you were last screened, and you are at increased risk for chlamydia or gonorrhea. Ask your health care provider if you are at risk. Ask your health care provider about whether you are at high risk for HIV. Your health care provider  may recommend a prescription medicine to help prevent HIV infection. If you choose to take medicine to prevent HIV, you should first get tested for HIV. You should then be tested every 3 months for as long as you are taking the medicine. Follow these instructions at home: Alcohol use Do not drink alcohol if your health care provider tells you not to drink. If you drink alcohol: Limit how much you have to 0-2 drinks a day. Know how much alcohol is in your drink. In the [place] one drink equals one 12 oz bottle of beer (355 mL), one 5 oz glass of wine (148 mL), or one 1 oz glass of hard liquor (44 mL). Lifestyle Do not use any products that contain nicotine or tobacco. These products include cigarettes, chewing tobacco, and vaping devices, such as e-cigarettes. If you need help quitting, ask your health care provider. Do not use street drugs. Do not share needles. Ask your health care provider for help if you need support or information about quitting drugs. General instructions Schedule regular health, dental, and eye exams. Stay current with your vaccines. Tell your health care provider if: You often feel depressed. You have ever been abused or do not feel safe at home. Summary Adopting a healthy lifestyle and getting preventive care are important in promoting health and wellness. Follow your health care provider's instructions about healthy diet, exercising, and getting tested or screened for diseases. Follow your health care provider's instructions on monitoring your cholesterol and blood pressure. This information is not intended to replace advice given to you by your health care provider. Make sure you discuss any questions you have with your health care provider. Document Revised: 07/24/2020 Document Reviewed: 07/24/2020 Elsevier Patient Education  2023 [employer] Inc[employer]  

## 2021-09-04 ENCOUNTER — Ambulatory Visit: Payer: Self-pay

## 2021-09-04 DIAGNOSIS — E1169 Type 2 diabetes mellitus with other specified complication: Secondary | ICD-10-CM

## 2021-09-04 DIAGNOSIS — Z794 Long term (current) use of insulin: Secondary | ICD-10-CM

## 2021-09-04 NOTE — Patient Instructions (Signed)
Visit Information  Thank you for taking time to visit with me today. Please don't hesitate to contact me if I can be of assistance to you before our next scheduled telephone appointment.  Following are the goals we discussed today:  Diabetes:  (Status: Goal on Track (progressing): YES.) Long Term Goal         Lab Results  Component Value Date    HGBA1C 7.8 (H) 07/16/2021  Assessed patient's understanding of A1c goal: <7% Provided education to patient about basic DM disease process; Reviewed medications with patient and discussed importance of medication adherence. 03-21-2021: The patients wife states that the patient is compliant with his medications. Denies any concerns with medications.  04-17-2021: The patients wife called the office and ask the staff to have RNCM call her. Call back to the patients wife. She needs assistance with paperwork that she received from [facility]. She could not remember if it was "[facility] [name]" she needed to talk to. The patient states that she does not know what to do with the letter. Education provided and will have the pharm D reach out to the patient. Secure message sent to pharm D for assistance with the needs of the patient. The patients wife is appreciative of the help.   04-19-2021: The patients wife spoke to the pharm D yesterday and was provided the information she needed. The patient is doing well and denies any new concerns at this time.   06-21-2021: The patient was taken off of the Trulicity as it was felt that this medication was contributing to diarrhea. The diarrhea has improved since discontinuation of the medication. The patient had a change in insurance guidelines for glucose meter and the patient reached out to the office and pharm D for assistance. The patient has what he needs for continued glucose monitoring in place now. Pharm D works closely with the patient and his wife to get the needs of the patient met.  07-23-2021: The patient is compliant with  medications and works with the [facility] D on a regular basis.  Reviewed prescribed diet with patient heart healthy/ADA. 07-23-2021: Review of dietary restrictions and monitoring for foods high in sugars, sodium, and fats.  Counseled on importance of regular laboratory monitoring as prescribed. 07-23-2021: The patient has regular lab work for evaluation of A1C and glucose. Review with the wife the goals of therapy and A1C level from recent lab draws        Discussed plans with patient for ongoing care management follow up and provided patient with direct contact information for care management team;      Provided patient with written educational materials related to hypo and hyperglycemia and importance of correct treatment. 07-23-2021: Denies any lows at this time. ;       Reviewed scheduled/upcoming provider appointments including:  Knows to call for changes ;         Advised patient, providing education and rationale, to check cbg as directed  and record. 03-21-2021: The patients wife states his blood sugars this am were 110. Range for blood sugar readings have been 110 to 130. Review of goal of fasting blood sugars of <130 and post prandial of <180.  06-21-2021: The patients wife states that the patient has been still having some high readings but is overall doing well. The patients wife states he has not checked his blood sugar this am as he just got up. She states they are keeping a close eye on it.  07-23-2021: The patients blood sugar this am was 169. Education on the goal of fasting of <130 and post prandial of <180.  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 He has PT/OT and a nurse coming in the home to evaluate and monitor. The patient is walking with his walker and the patients wife denies any falls or safety concerns. The patient has a follow up with the provider on 04-10-2021. Education on sx and sx of infection and to call the office for changes at surgical site, questions or concerns. 04-19-2021: The patients wife states the area of surgical intervention is healing well and there are no new concerns related to his right foot. Will continue to monitor.  04-17-2021: The patient was seen in the office on 04-13-2021 for evaluation of diarrhea he was having. Follow up today while discussing other needs and the patients wife states his diarrhea is better and denies any acute findings. Will continue to monitor.   09-04-2021: Incoming call from the patients wife. The patient at one time was on Trulicity but could not tolerate it due to diarrhea. The patients wife states that they just delivered from [facility] a package of Trulicity and she needed to know what to do with it. Secure chat sent to the pcp, office staff and practice manager asking if they could take the Trulicity from the patient. Instructed the patients wife to take the Trulicity to the office and give to staff. Will ask the pharm D to call and make sure the patient does not get any further automatic deliveries.    Hyperlipidemia:  (Status: Goal on Track (progressing): YES.)      Lab Results  Component Value Date    CHOL 163 07/06/2021    HDL 37 (L) 07/06/2021    LDLCALC 89 07/06/2021    TRIG 186 (H) 07/06/2021    CHOLHDL 4.4 07/06/2021      Medication review performed; medication list updated in electronic medical record. 09-04-2021: The patient is compliant with Crestor  Provider established cholesterol goals reviewed. 09-04-2021: Review of labs and the patient is compliant with regular lab work needs; Counseled on importance of regular laboratory monitoring as prescribed. 09-04-2021: Has labs  on a regular basis. Provided HLD educational materials; Reviewed role and benefits of statin for ASCVD risk reduction; Discussed strategies to manage statin-induced myalgias; Reviewed importance of limiting foods high in cholesterol. 07-23-2021: Review of heart healthy/ADA diet and foods to limit Reviewed exercise goals and target of 150 minutes per week;  Our next appointment as scheduled  Please call the care guide team at [phone] if you need to cancel or reschedule your appointment.   If you are experiencing a Mental Health or [place] or need someone to talk to, please call the Suicide and Crisis Lifeline: 988 call the [place] National Suicide Prevention Lifeline: (762)021-0293 or TTY: 520-747-8534 TTY 646 449 0828) to talk to a trained counselor call 1-800-273-TALK (toll free, 24 hour hotline)   The patient verbalized understanding of instructions, educational materials, and care plan provided today and DECLINED offer to receive copy of patient instructions, educational materials, and care plan.  Noreene Larsson RN, MSN, [facility] Mobile: [phone]

## 2021-09-04 NOTE — Chronic Care Management (AMB) (Signed)
Chronic Care Management   CCM RN Visit Note  09/04/2021 Name: Lucas Caldwell MRN: [id number] DOB: 1933-06-05  Subjective: Lucas Caldwell is a 88 y.o. year old male who is a primary care patient of [name], DO. The care management team was consulted for assistance with disease management and care coordination needs.    Engaged with patient by telephone for follow up visit in response to provider referral for case management and/or care coordination services.   Consent to Services:  The patient was given information about Chronic Care Management services, agreed to services, and gave verbal consent prior to initiation of services.  Please see initial visit note for detailed documentation.   Patient agreed to services and verbal consent obtained.   Assessment: Review of patient past medical history, allergies, medications, health status, including review of consultants reports, laboratory and other test data, was performed as part of comprehensive evaluation and provision of chronic care management services.   SDOH (Social Determinants of Health) assessments and interventions performed:    CCM Care Plan  Allergies  Allergen Reactions   Clopidogrel Anaphylaxis and Other (See Comments)    altered mental status;  (electrolytes "out of wack" and confusion per family; THEY DO NOT REMEMBER ANY OTHER REACTION)- MSB 10/10/15   Ciprofloxacin Itching   Beta Adrenergic Blockers     Nightmares   Mirtazapine Diarrhea    Bad dreams    Spironolactone Other (See Comments)    gynecomastia    Outpatient Encounter Medications as of 09/04/2021  Medication Sig   acetaminophen (TYLENOL) 500 MG tablet Take 1,000 mg by mouth every 6 (six) hours as needed for moderate pain or headache.    Alcohol Swabs (B-D SINGLE USE SWABS REGULAR) PADS Use to check blood sugar up to 2 times daily   allopurinol (ZYLOPRIM) 100 MG tablet Take 1 tablet (100 mg total) by mouth daily.   aspirin 81 MG  chewable tablet Chew 81 mg by mouth daily.   Blood Glucose Calibration (OT ULTRA/FASTTK CNTRL SOLN) SOLN    Blood Glucose Monitoring Suppl (ONE TOUCH ULTRA 2) w/Device KIT SMARTSIG:Via Meter   bosutinib (BOSULIF) 100 MG tablet Take 100 mg by mouth at bedtime. Take with food.   Cholecalciferol 25 MCG (1000 UT) capsule Take 1,000 Units by mouth daily.   famotidine (PEPCID) 40 MG tablet Take 1 tablet (40 mg total) by mouth 2 (two) times daily with a meal.   gabapentin (NEURONTIN) 300 MG capsule Take 1 capsule (300 mg total) by mouth 3 (three) times daily.   Insulin Glargine ([name] KWIKPEN) 100 UNIT/ML Inject 20 Units into the skin daily.   Insulin Pen Needle 31G X 5 MM MISC Use to inject insulin daily.   isosorbide mononitrate (IMDUR) 60 MG 24 hr tablet Take 0.5 tablets (30 mg total) by mouth daily.   JARDIANCE 10 MG TABS tablet Take 1 tablet (10 mg total) by mouth daily with breakfast.   Lancets (ONETOUCH DELICA PLUS [id number]) MISC Use to check blood sugar twice daily   levothyroxine (SYNTHROID) 25 MCG tablet TAKE ONE TABLET BY MOUTH EVERY MORNING BEFORE BREAKFAST   losartan (COZAAR) 25 MG tablet TAKE 1/2 TABLET(12.5 MG) BY MOUTH DAILY   Menthol-Zinc Oxide (GOLD BOND EX) Apply 1 application topically daily as needed (muscle pain).   mirabegron ER (MYRBETRIQ) 25 MG TB24 tablet Take 1 tablet (25 mg total) by mouth daily.   Multiple Vitamin (MULTIVITAMIN WITH MINERALS) TABS tablet Take 1 tablet by mouth daily.   nitroGLYCERIN (  NITROSTAT) 0.4 MG SL tablet Place 1 tablet (0.4 mg total) under the tongue every 5 (five) minutes x 3 doses as needed for chest pain.   ONETOUCH VERIO test strip Check blood sugar up to 2 x daily   Polyethyl Glycol-Propyl Glycol (SYSTANE OP) Place 1 drop into both eyes 3 (three) times daily as needed (dry/irritated eyes.).    polyethylene glycol (MIRALAX / GLYCOLAX) 17 g packet Take 17 g by mouth daily as needed for mild constipation.    ranolazine (RANEXA) 500 MG 12 hr  tablet TAKE ONE TABLET BY MOUTH TWICE DAILY   rosuvastatin (CRESTOR) 5 MG tablet Take 1 tablet (5 mg total) by mouth daily.   tamsulosin (FLOMAX) 0.4 MG CAPS capsule Take 1 capsule (0.4 mg total) by mouth daily after supper.   torsemide (DEMADEX) 20 MG tablet Take 1 tablet (20 mg total) by mouth as directed. TAKE 2 tablets in the AM and 1 tablet in the evening, every other day. May take additional 1 tablet (20 mg) as needed for weight greater than 198 pounds.   traMADol (ULTRAM) 50 MG tablet Take 1-2 tablets (50-100 mg total) by mouth at bedtime as needed.   warfarin (COUMADIN) 3 MG tablet TAKE 1 TO 1 & 1/2 TABLETS BY MOUTH AS DIRECTED BY THE COUMADIN CLINIC Strength: 3 mg   No facility-administered encounter medications on file as of 09/04/2021.    Patient Active Problem List   Diagnosis Date Noted   Amputated great toe, right ([place]) 03/05/2021   Acute hematogenous osteomyelitis ([place])    Cellulitis of foot    Diabetic foot infection ([place])    Diabetic foot ulcer ([place]) 02/21/2021   Diabetic polyneuropathy associated with type 2 diabetes mellitus ([place]) 11/23/2020   Orthostatic hypotension 10/16/2020   Obstructive sleep apnea 06/29/2020   Urge incontinence of urine 12/22/2019   Trifascicular block 10/20/2019   Wound infection 10/08/2019   Bifascicular block 05/13/2019   Coronary stent patent 04/13/2019   PSVT (paroxysmal supraventricular tachycardia) ([place]) 03/19/2019   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 ([place]) 08/24/2018   CKD (chronic kidney disease), stage IV ([place]) 06/04/2018   Osteoarthritis of knees, bilateral 06/02/2018   Hyperlipidemia associated with type 2 diabetes mellitus ([place]) 05/06/2018   Pain of lower extremity 03/06/2018   Duodenitis 01/30/2018   Hematochezia    Melena    Rectal bleeding 01/11/2018   AVM (arteriovenous malformation) of colon without hemorrhage    Schatzki's ring    Esophageal dysphagia    Diabetic retinopathy ([place]) 07/08/2017    Cardiomyopathy ([place]) 06/26/2017   Centrilobular emphysema ([place]) 06/13/2017   Fatigue 12/18/2016   Stable angina ([place]) 09/20/2016   Stage 3a chronic kidney disease ([place]) 09/20/2016   Paroxysmal atrial fibrillation ([place]) 08/28/2016   Chronic anticoagulation 08/28/2016   Essential hypertension 08/20/2016   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 heart with stable angina pectoris ([place]) 08/19/2016   Exertional dyspnea 08/12/2016   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[place]) 12/29/2015   Osteomyelitis of great toe of right foot ([place]) 10/03/2015   Ulcerated, foot, right, with necrosis of bone ([place]) 09/26/2015   Gynecomastia, male 03/31/2014   Hypertrophy of breast 03/31/2014   Nuclear sclerosis of left eye 10/22/2013   Enthesopathy of ankle and tarsus 04/10/2013   Controlled type 2 diabetes mellitus with diabetic nephropathy, with long-term current use of insulin ([place]) 04/09/2013   Diabetic neuropathy associated with type 2 diabetes mellitus ([place]) 04/09/2013   Cataract of left eye 11/25/2011  Corneal opacity 09/30/2011   Pseudophakia of right eye 09/30/2011   GERD (gastroesophageal reflux disease) 09/25/2011   Gout 09/25/2011   Hyperlipidemia LDL goal <70 09/25/2011   Status post cataract extraction 04/11/2011   Cataract extraction status of eye 04/11/2011   Iron deficiency anemia 04/01/2011   Benign localized hyperplasia of prostate without urinary obstruction and other lower urinary tract symptoms (LUTS) 12/10/2010   Enlarged prostate without lower urinary tract symptoms (luts) 12/10/2010   Colon cancer screening 10/11/2010   Hypothyroidism 10/11/2010   Hyperlipidemia 09/20/2003   Essential and other specified forms of tremor 01/05/2001   Essential tremor 01/05/2001    Conditions to be addressed/monitored:HLD and DMII  Care Plan : RNCM: General Plan of Care (Adult) for Chronic Disease Management and Care Cooerdination Needs  Updates made by Vanita Ingles, RN since 09/04/2021 12:00 AM      Problem: RNCM: Development of Plan of Care for Chronic Disease Management (HF, CAD, HTN,HLD, DM, COPD, CKD3, Chronic Pain)   Priority: High     Long-Range Goal: RNCM: Effective Management  of Plan of Care for Chronic Disease Management (HF, CAD, HTN,HLD, DM, COPD, CKD3, Chronic Pain)   Start Date: 01/22/2021  Expected End Date: 01/22/2022  Priority: High  Note:   Current Barriers:  Knowledge Deficits related to plan of care for management of CHF, CAD, HTN, HLD, COPD, DMII, CKD Stage 3, and chronic pain  Care Coordination needs related to Level of care concerns and multiple chronic conditions  Chronic Disease Management support and education needs related to CHF, CAD, HTN, HLD, COPD, DMII, CKD Stage 3, and chronic pain   RNCM Clinical Goal(s):  Patient will verbalize understanding of plan for management of CHF, CAD, HTN, HLD, COPD, DMII, CKD Stage 3, and Osteoarthritis as evidenced by understanding the plan of care, calling the provider for changes, and working with the CCM to optimize health and well being for effective management of chronic conditions take all medications exactly as prescribed and will call provider for medication related questions as evidenced by taking medications as prescribed, contacting providers for questions about medications, and working with the pharm D to manage polypharmacy and medications concerns     attend all scheduled medical appointments:  06-21-2021: The patient has had several appointments since last outreach. The patient saw the pcp 07-16-2021, as evidenced  by keeping appointments with pcp and other specialist and calling for new appointments needed or questions        demonstrate improved and ongoing adherence to prescribed treatment plan for CHF, CAD, HTN, HLD, COPD, DMII, CKD Stage 3, and Osteoarthritis as evidenced by working with the CCM team to optimize health and well being  demonstrate a decrease in CHF, CAD, HTN, HLD, COPD, DMII, CKD Stage 3, and  Osteoarthritis exacerbations  as evidenced by calling the provider for changes in conditions, stable chronic conditions and working with the CCM team to effectively manage health and well being  demonstrate ongoing self health care management ability for effective management of chronic conditions  as evidenced by  working with the CCM team  through collaboration with Consulting civil engineer, provider, and care team.   Interventions: 1:1 collaboration with primary care provider regarding development and update of comprehensive plan of care as evidenced by provider attestation and co-signature Inter-disciplinary care team collaboration (see longitudinal plan of care) Evaluation of current treatment plan related to  self management and patient's adherence to plan as established by provider   SDOH Barriers (Status: Goal on  Track (progressing): YES.) Long Term Goal  Patient interviewed and SDOH assessment performed        Patient interviewed and appropriate assessments performed Provided patient with information about resources in [place] and care guides available for assistance with changes in SDOH, questions or concerns Discussed plans with patient for ongoing care management follow up and provided patient with direct contact information for care management team Advised patient to call the office for changes in SDOH, new concerns or questions  Provided education to patient/caregiver regarding level of care options. Care guide referral for expressed need of someone to install rails or a sliding door with rails to the bathroom. Instructed the patients wife that the care guide team would reach out to her concerning resources in [place]. (04-19-2021)    CAD  (Status: Goal on Track (progressing): YES.) Long Term Goal  BP Readings from Last 3 Encounters:  07/16/21 138/72  07/06/21 120/62  05/23/21 (!) 148/81    Lab Results  Component Value Date   CHOL 163 07/06/2021   HDL 37 (L) 07/06/2021    LDLCALC 89 07/06/2021   TRIG 186 (H) 07/06/2021   CHOLHDL 4.4 07/06/2021    Assessed understanding of CAD diagnosis. 07-23-2021: The patient and his wife are compliant with a plan of care. They see the cardiologist on a consistent basis.  Medications reviewed including medications utilized in CAD treatment plan. 07-23-2021: The patient is compliant with medications.  Provided education on importance of blood pressure control in management of CAD; Provided education on Importance of limiting foods high in cholesterol. 04-19-2021: Review of dietary intake and patient is compliant with heart healthy/ADA diet. Education on foods to avoid since the patient is having diarrhea off and on. The patients wife is very attentive to the needs of the patient and watches what he eats and does. 06-21-2021: The patients wife states the patients diarrhea is much better now. She says that he is eating good. [name] stable and no new concerns. 07-23-2021: The patient is compliant with dietary restrictions.  Counseled on importance of regular laboratory monitoring as prescribed. 07-23-2021: Has blood work on a regular basis; Reviewed Importance of taking all medications as prescribed. 07-23-2021: Review of medications and patient is compliant with medications. Denies any medication needs or changes in cardiac medications. Is working with the pharm D on a regular basis.  Reviewed Importance of attending all scheduled provider appointments.  Keeps appointments, calls for changes and needs Advised to report any changes in symptoms or exercise tolerance Screening for signs and symptoms of depression related to chronic disease state;  Assessed social determinant of health barriers;   Heart Failure Interventions:  (Status: Goal on Track (progressing): YES.)  Long Term Goal  Basic overview and discussion of pathophysiology of Heart Failure reviewed; Provided education on low sodium diet. 03-21-2021: The patient is compliant with heart  healthy/ADA diet. Is not feeling the best today because of some diarrhea but wife states he is eating and drinking well. 07-23-2021: Review of heart healthy/ADA diet with the patients wife. The patient is having a good day today. Denies any fluid overload Reviewed Heart Failure Action Plan in depth and provided written copy; Assessed need for readable accurate scales in home. 06-21-2021: Participates in the HF program and weighs daily on a provided mat that send readings into the HF clinic for review and recommendations is any. Per the patients wife he is doing well and has not had an exacerbations in his HF. Reports weight daily.  Provided education about placing scale on hard, flat surface; Advised patient to weigh each morning after emptying bladder; Discussed importance of daily weight and advised patient to weigh and record daily; Reviewed role of diuretics in prevention of fluid overload and management of heart failure. 07-23-2021: Is compliant with the plan of care for diuretics. Actively participates in HF clinic and reporting weights daily; Discussed the importance of keeping all appointments with provider. Has appointments coming up with specialist and calls pcp when needed.  Provided patient with education about the role of exercise in the management of heart failure; Advised patient to discuss torsemide dose  with provider.  01-22-2021: The patients wife states that she has not heard back from the heart failure clinic on the Torsemide dose. Advised the patients wife to call the HF clinic back and request clarification on Torsemide dose. The patients wife verbalized understanding. She states he is not having any issues with swelling but he is having a problem with "holding his water" and its been going on for a while. 01-24-2021: The pharm D is also assisting with getting in touch with the cardiologist/HF clinic to get clarification of medication dosage. 07-23-2021: The patient is taking medications as  directed. Denies any issues with medication compliance.  EF from August 2022 is 30 to 35%  Chronic Kidney Disease (Status: Goal on Track (progressing): YES.)  Long Term Goal  Last practice recorded BP readings:  BP Readings from Last 3 Encounters:  07/16/21 138/72  07/06/21 120/62  05/23/21 (!) 148/81  Most recent eGFR/CrCl:  Lab Results  Component Value Date   EGFR 55 (L) 05/16/2021    No components found for: CRCL  Assessed the patient   and patients wife for   understanding of chronic kidney disease    Evaluation of current treatment plan related to chronic kidney disease self management and patient's adherence to plan as established by provider . 01-22-2021: The patients wife states the patient is having issues with "holding his water". States the urologist gave him a pill but it does not help. The patient is taking myrbetriq but the patients wife says he cannot tell where it helps him. Advised the patients wife to follow up with urologist, will also collaborate with the pcp for any other additional recommendations. Advised the patients wife with the multiple medications he takes and his chronic conditions that this is something that may be a little harder to manage.  07-23-2021: The patient is doing well with management of his CKD. EGFR actually better with most recent labs. The patient is monitored closely. The patients wife states there are no new concerns at this time with kidney function.   Provided education to patient re: stroke prevention, s/s of heart attack and stroke    Reviewed prescribed diet heart healthy/ADA diet. 07-23-2021: Review of heart healthy/ADA diet  Reviewed medications with patient and discussed importance of compliance.  04-19-2021: The patients wife states compliance with medications. Talked with pharm D on 04-18-2021. The patient works with the CCM team on a regular basis for assistance with effective management of chronic conditions. 07-23-2021: Is compliant with  medications. Works closely with the pharm D for medications management. Advised patient, providing education and rationale, to monitor blood pressure daily and record, calling PCP for findings outside established parameters    Discussed complications of poorly controlled blood pressure such as heart disease, stroke, circulatory complications, vision complications, kidney impairment, sexual dysfunction    Advised patient to discuss urinary incontinence and frequency  with provider    Discussed plans with patient for ongoing care management follow up and provided patient with direct contact information for care management team    Discussed the impact of chronic kidney disease on daily life and mental health and acknowledged and normalized feelings of disempowerment, fear, and frustration    Engage patient in early, proactive and ongoing discussion about goals of care and what matters most to them      COPD: (Status: Goal on Track (progressing): YES.) Long Term Goal  Reviewed medications with patient, including use of prescribed maintenance and rescue inhalers, and provided instruction on medication management and the importance of adherence. 07-23-2021: Is compliant with medications Provided patient with basic written and verbal COPD education on self care/management/and exacerbation prevention; Advised patient to track and manage COPD triggers. 04-19-2021: The patient and patients wife are aware of triggers that cause COPD exacerbation. The patient has some GI symptoms going on but is not having any respiratory sx and sx of infection. Education and support given. 07-23-2021: The patient and wife state that his COPD is stable at this time. Shortness of breath is no more than usual. The patients wife states he is having a good day today.   Provided written and verbal instructions on pursed lip breathing and utilized returned demonstration as teach back; Provided instruction about proper use of medications used  for management of COPD including inhalers. 07-23-2021: The patient is compliant with medications Advised patient to self assesses COPD action plan zone and make appointment with provider if in the yellow zone for 48 hours without improvement; Advised patient to engage in light exercise as tolerated 3-5 days a week to aid in the the management of COPD; Provided education about and advised patient to utilize infection prevention strategies to reduce risk of respiratory infection. 04-19-2021: Review of staying safe and not being around others who are sick or exhibiting respiratory sx and sx. The patients wife states they only go out when they need to go. He cancelled his dentist appointment yesterday due to the patient needing to go off of his coumadin and the wife states that she is going to reschedule this at a later date. 07-23-2021: Education and support given. No new concerns related to infection prevention; Discussed the importance of adequate rest and management of fatigue with COPD. 07-23-2021: Review of pacing activity. ;  Diabetes:  (Status: Goal on Track (progressing): YES.) Long Term Goal   Lab Results  Component Value Date   HGBA1C 7.8 (H) 07/16/2021  Assessed patient's understanding of A1c goal: <7% Provided education to patient about basic DM disease process; Reviewed medications with patient and discussed importance of medication adherence. 03-21-2021: The patients wife states that the patient is compliant with his medications. Denies any concerns with medications.  04-17-2021: The patients wife called the office and ask the staff to have RNCM call her. Call back to the patients wife. She needs assistance with paperwork that she received from [facility]. She could not remember if it was "[facility] [name]" she needed to talk to. The patient states that she does not know what to do with the letter. Education provided and will have the pharm D reach out to the patient. Secure message sent to pharm D for  assistance with the needs of the patient. The patients wife is appreciative of the help.   04-19-2021: The patients wife spoke to the pharm D yesterday and was provided the information she needed. The patient is doing well and  denies any new concerns at this time.   06-21-2021: The patient was taken off of the Trulicity as it was felt that this medication was contributing to diarrhea. The diarrhea has improved since discontinuation of the medication. The patient had a change in insurance guidelines for glucose meter and the patient reached out to the office and pharm D for assistance. The patient has what he needs for continued glucose monitoring in place now. Pharm D works closely with the patient and his wife to get the needs of the patient met.  07-23-2021: The patient is compliant with medications and works with the [place] D on a regular basis.  Reviewed prescribed diet with patient heart healthy/ADA. 07-23-2021: Review of dietary restrictions and monitoring for foods high in sugars, sodium, and fats.  Counseled on importance of regular laboratory monitoring as prescribed. 07-23-2021: The patient has regular lab work for evaluation of A1C and glucose. Review with the wife the goals of therapy and A1C level from recent lab draws        Discussed plans with patient for ongoing care management follow up and provided patient with direct contact information for care management team;      Provided patient with written educational materials related to hypo and hyperglycemia and importance of correct treatment. 07-23-2021: Denies any lows at this time. ;       Reviewed scheduled/upcoming provider appointments including:  Knows to call for changes ;         Advised patient, providing education and rationale, to check cbg as directed  and record. 03-21-2021: The patients wife states his blood sugars this am were 110. Range for blood sugar readings have been 110 to 130. Review of goal of fasting blood sugars of <130 and post  prandial of <180.  06-21-2021: The patients wife states that the patient has been still having some high readings but is overall doing well. The patients wife states he has not checked his blood sugar this am as he just got up. She states they are keeping a close eye on it.    07-23-2021: The patients blood sugar this am was 169. Education on the goal of fasting of <130 and post prandial of <180.  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 He has PT/OT and a nurse coming in the home to evaluate and monitor. The patient is walking with his walker and the patients wife denies any falls or safety concerns. The patient has a follow up with the provider on 04-10-2021. Education on sx and sx of infection and to call the office for changes at surgical site, questions or concerns. 04-19-2021: The patients wife states the area of surgical intervention is healing well and there are no new concerns related to his right foot. Will continue to monitor.  04-17-2021: The patient was seen in the office on 04-13-2021 for evaluation of diarrhea he was having. Follow up today while discussing other needs and the patients wife states his diarrhea is better and denies any acute findings. Will continue to monitor.   09-04-2021: Incoming call from the patients wife. The patient at one time was  on Trulicity but could not tolerate it due to diarrhea. The patients wife states that they just delivered from [facility] a package of Trulicity and she needed to know what to do with it. Secure chat sent to the pcp, office staff and practice manager asking if they could take the Trulicity from the  patient. Instructed the patients wife to take the Trulicity to the office and give to staff. Will ask the pharm D to call and make sure the patient does not get any further automatic deliveries.   Hyperlipidemia:  (Status: Goal on Track (progressing): YES.) Lab Results  Component Value Date   CHOL 163 07/06/2021   HDL 37 (L) 07/06/2021   LDLCALC 89 07/06/2021   TRIG 186 (H) 07/06/2021   CHOLHDL 4.4 07/06/2021     Medication review performed; medication list updated in electronic medical record. 09-04-2021: The patient is compliant with Crestor  Provider established cholesterol goals reviewed. 09-04-2021: Review of labs and the patient is compliant with regular lab work needs; Counseled on importance of regular laboratory monitoring as prescribed. 09-04-2021: Has labs on a regular basis. Provided HLD educational materials; Reviewed role and benefits of statin for ASCVD risk reduction; Discussed strategies to manage statin-induced myalgias; Reviewed importance of limiting foods high in cholesterol. 07-23-2021: Review of heart healthy/ADA diet and foods to limit Reviewed exercise goals and target of 150 minutes per week;  Hypertension: (Status: Goal on Track (progressing): YES.) Last practice recorded BP readings:  BP Readings from Last 3 Encounters:  07/16/21 138/72  07/06/21 120/62  05/23/21 (!) 148/81  Most recent eGFR/CrCl:  Lab Results  Component Value Date   EGFR 55 (L) 05/16/2021    No components found for: CRCL  Evaluation of current treatment plan related to hypertension self management and patient's adherence to plan as established by provider. 01-24-2021: The patient was eating breakfast. Reviewed with wife several upcoming appointments and follow ups scheduled.   07-23-2021: The patient is having more normal blood pressure readings. The patient denies any issues with HTN or heart health. Has a lot of chronic conditions impacting his care. Will continue to monitor for changes.   Provided education to patient re: stroke prevention, s/s of heart attack and stroke; Reviewed prescribed diet heart healthy/ADA Reviewed medications with patient and discussed importance of compliance. 07-23-2021: The patient is compliant with medications and is monitored closely;  Discussed plans with patient for ongoing care management follow up and provided patient with direct contact information for care management team; Advised patient, providing education and rationale, to monitor blood pressure daily and record, calling PCP for findings outside established parameters;  Advised patient to discuss blood pressure trends  with provider; Provided education on prescribed diet heart healthy/ADA diet ;  Discussed complications of poorly controlled blood pressure such as heart disease, stroke, circulatory complications, vision complications, kidney impairment, sexual dysfunction;   Pain:  (Status: Goal on Track (progressing): YES.) Long Term Goal  Pain assessment performed. 03-24-2020: the patient rates his pain in his bilateral shoulders at an 8 today on a scale of 0-10.  The patient states that he cannot raise his arms but so high. The patient is not taking any medications for pain relief. Has not tried heat or cold applications. The patient states the injections he gets at the provider office usually help but they have not this time. The patient states that he is open to recommendations. 03-21-2021: The patient denies any pain today. The patient has upcoming imaging to evaluate shoulders. 04-19-2021: Denies  pain today. Is having a good day. 06-21-2021: The patient rates his right shoulder pain at a 3 today on a scale of 0-10. Has pain in both shoulders but right is worse that the left. 07-23-2021; Denies pain today Medications reviewed. 01-22-2021: The patient is currently not taking any medications for pain relief. Does have gabapentin that he takes on a consistent basis for neuropathy pain. Dosage in question  concerning this as the neurologist has increased dose to 800 mg and renal dose recommends dose not to exceed 600 mg BID. Education with wife concerning medication changes. 07-23-2021: Is compliant with medications regimen.  Reviewed provider established plan for pain management. 01-22-2021: The patient and the patients wife ask if physical therapy would help with shoulder pain and discomfort. Will collaborate with the pcp on recommendations and let the patient and the patients wife know. 01-24-2021: Call made to [facility] on 01-23-2021 to ask about referral from pcp office. The staff states the referral was received and the patient has an appointment With Dr. Eliberto Ivory at 10 am on 02-14-2021.  Attempted to reach the patient or patients wife but was unsuccessful. Returned call today and explained to the wife the orthopedic appointment would address the patients shoulder pain and evaluate and recommend further treatment. Used teach back method for understanding of upcoming appointment with the orthopedic provider. Will continue to monitor. 04-19-2021: The patient is following the recommendations of the provider. The patient is seen on a consistent basis for evaluation and treatment options. 06-21-2021: The patient had a great benefit from PT on his right shoulder. He and his wife are interested in more time with PT. They are waiting to hear back from the referral now.  Discussed importance of adherence to all scheduled medical appointments. 07-16-2021 with the pcp next appointment Counseled on the importance of reporting any/all new or changed pain symptoms or management strategies to pain management provider; Advised patient to report to care team affect of pain on daily activities; Discussed use of relaxation techniques and/or diversional activities to assist with pain reduction (distraction, imagery, relaxation, massage, acupressure, TENS, heat, and cold application; Reviewed with patient prescribed pharmacological and  nonpharmacological pain relief strategies; Advised patient to discuss unresolved pain, changes in level and intensity of pain with provider;   GERD/Acid reflux  (Status: Goal on Track (progressing): YES.) Long Term Goal  Evaluation of current treatment plan related to GERD,  self-management and patient's adherence to plan as established by provider. 03-21-2021: During outreach today the patients wife states the patient for the last 3 days has had a "sour burp" and not feeling the best with episodes of diarrhea x 3 to 4 times during the night. Denies fever or chills. Is tolerating food and beverages without difficulty. Secure message sent to pcp and CMA for recommendations. Call made back to the wife with instructions from Dr. Parks Ranger. 04-17-2021: The patient denies any issues with GERD and Diarrhea he was seen in the office for on 04-13-2021 has resolved. 04-19-2021: The patients wife states that he is doing good but the diarrhea is coming and going. The patients wife states that she has not gotten the peppermint OTC as recommended by pcp. She will pick some up. She states that she doesn't know why it happens sometimes and not others. Review of monitoring patterns of what the patient is eating and drinking and see if this is a part of the reason he is having diarrhea. The patients wife states since he is not drinking green tea  he is doing better with the diarrhea. Education on foods that can cause more IBS and GERD. The patients wife states she will monitor more closely his dietary intake. States the patient is having a good day today. Will continue to monitor. 06-21-2021: The patients wife states that since the patient has stopped taking the Trulicity that he is not having diarrhea like he was. [name] staying off of the Trulicity and they are monitoring for additional recommendations and treatment options. 07-23-2021: Denies any new concerns with GERD. Will continue to monitor Discussed plans with patient for  ongoing care management follow up and provided patient with direct contact information for care management team Advised patient to call the office if symptoms of GERD/ACID reflux do not improve with recommended changes; Provided education to patient re: for the patient at the direction of pcp to increase his omeprazole dose from 20 mg daily to 40 mg daily. Used the teach back method to make sure the patients wife understood the instructions given. The patients wife wrote down instructions and repeated instructions back to [facility]. ; Reviewed medications with patient and discussed 03-21-2021: Advised the patients wife to only use imodium for severe diarrhea. Also advised per instructions from Dr. Parks Ranger that is was okay to take gas-x and pepto bismol as needed. Also to increase Omeprazole dose from 6m daily to 40 mg daily. 04-19-2021: The patients wife endorses compliance with medications. States she has not picked up the peppermint OTC yet as recommended by the pcp but she will to have on hand for diarrhea. Also talked with the pharm D on 04-18-2021 for assistance with medications questions. 07-23-2021: The patient is compliant with medications; Collaborated with PCP, CMA, and pharm D regarding new complaint of GI symptoms. Recommendations received and the wife called back with instructions. 04-19-2021: The patient is compliant with recommendations by the pcp; Provided patient with instructions from pcp for new onset of GI symptoms x 3 days educational materials related to new onset of GI symptoms. 04-19-2021: Additional follow up and education today on GI symptoms and if flare up occurs; Pharmacy referral for collaboration, ongoing support, and education for medications reconciliation and management ; Discussed plans with patient for ongoing care management follow up and provided patient with direct contact information for care management team; Advised patient to discuss unresolved GI condition with recommended  changes, questions, concerns and sx and sx of worsening condition with provider;   Patient Goals/Self-Care Activities: Patient will self administer medications as prescribed as evidenced by self report/primary caregiver report  Patient will attend all scheduled provider appointments as evidenced by clinician review of documented attendance to scheduled appointments and patient/caregiver report Patient will call pharmacy for medication refills as evidenced by patient report and review of pharmacy fill history as appropriate Patient will attend church or other social activities as evidenced by patient report Patient will continue to perform ADL's independently as evidenced by patient/caregiver report Patient will continue to perform IADL's independently as evidenced by patient/caregiver report Patient will call provider office for new concerns or questions as evidenced by review of documented incoming telephone call notes and patient report Patient will work with BSW to address care coordination needs and will continue to work with the clinical team to address health care and disease management related needs as evidenced by documented adherence to scheduled care management/care coordination appointments - call office if I gain more than 2 pounds in one day or 5 pounds in one week - keep legs up while sitting -  track weight in diary - use salt in moderation - watch for swelling in feet, ankles and legs every day - weigh myself daily - develop a rescue plan - follow rescue plan if symptoms flare-up - know when to call the doctor:for changes in HF sx and sx - track symptoms and what helps feel better or worse - dress right for the weather, hot or cold - schedule appointment with eye doctor - check blood sugar at prescribed times: before meals and at bedtime, when you have symptoms of low or high blood sugar, and before and after exercise - check feet daily for cuts, sores or redness - enter blood  sugar readings and medication or insulin into daily log - take the blood sugar log to all doctor visits - trim toenails straight across - drink 6 to 8 glasses of water each day - eat fish at least once per week - fill half of plate with vegetables - limit fast food meals to no more than 1 per week - manage portion size - prepare main meal at home 3 to 5 days each week - read food labels for fat, fiber, carbohydrates and portion size - reduce red meat to 2 to 3 times a week - keep feet up while sitting - wash and dry feet carefully every day - wear comfortable, cotton socks - wear comfortable, well-fitting shoes Keep appointments with the podiatrist, next appointment on 01-24-2021 - avoid second hand smoke - eliminate smoking in my home - identify and remove indoor air pollutants - limit outdoor activity during cold weather - listen for public air quality announcements every day - do breathing exercises every day - develop a rescue plan - eliminate symptom triggers at home - follow rescue plan if symptoms flare-up - use an extra pillow to sleep - develop a new routine to improve sleep - don't eat or exercise right before bedtime - eat healthy/prescribed diet: heart healthy/ADA diet  - get at least 7 to 8 hours of sleep at night - use devices that will help like a cane, sock-puller or reacher - practice relaxation or meditation daily - do exercises in a comfortable position that makes breathing as easy as possible - check blood pressure 3 times per week - choose a place to take my blood pressure (home, clinic or office, retail store) - write blood pressure results in a log or diary - learn about high blood pressure - keep a blood pressure log - take blood pressure log to all doctor appointments - call doctor for signs and symptoms of high blood pressure - develop an action plan for high blood pressure - keep all doctor appointments - take medications for blood pressure exactly  as prescribed - report new symptoms to your doctor - eat more whole grains, fruits and vegetables, lean meats and healthy fats - call for medicine refill 2 or 3 days before it runs out - take all medications exactly as prescribed - call doctor with any symptoms you believe are related to your medicine - call doctor when you experience any new symptoms - go to all doctor appointments as scheduled - adhere to prescribed diet: heart healthy/ADA diet        Plan:Telephone follow up appointment with care management team member scheduled for:  as schedfuled  Noreene Larsson RN, MSN, [place] Mobile: [phone]

## 2021-09-05 ENCOUNTER — Ambulatory Visit: Payer: Medicare HMO | Admitting: Pharmacist

## 2021-09-05 DIAGNOSIS — Z794 Long term (current) use of insulin: Secondary | ICD-10-CM

## 2021-09-05 NOTE — Patient Instructions (Signed)
Visit Information  Thank you for taking time to visit with me today. Please don't hesitate to contact me if I can be of assistance to you before our next scheduled telephone appointment.  Following are the goals we discussed today:   Goals Addressed             This Visit's Progress    Pharmacy Goals       Please check your blood sugar, keep a log of the results and bring this with you to your medical appointments.  Please check your home blood pressure, keep a log of the results and bring this with you to your medical appointments.  Our goal bad cholesterol, or LDL, is less than 70 . This is why it is important to continue taking your rosuvastatin.  Feel free to call me with any questions or concerns. I look forward to our next call!   [name], PharmD, [place] [phone]         Our next appointment is by telephone on 09/21/2021 at 10 am  Please call the care guide team at [phone] if you need to cancel or reschedule your appointment.    The patient verbalized understanding of instructions, educational materials, and care plan provided today and DECLINED offer to receive copy of patient instructions, educational materials, and care plan.

## 2021-09-05 NOTE — Chronic Care Management (AMB) (Signed)
Chronic Care Management CCM Pharmacy Note  09/05/2021 Name:  Lucas Caldwell MRN:  [id number] DOB:  06-30-1933   Subjective: Lucas Caldwell is an 88 y.o. year old male who is a primary patient of [name], DO.  The CCM team was consulted for assistance with disease management and care coordination needs.    Engaged with patient by telephone for follow up visit for pharmacy case management and/or care coordination services.   Objective:  Medications Reviewed Today     Reviewed by Vanita Ingles, RN (Case Manager) on 09/04/21 at 1047  Med List Status: <None>   Medication Order Taking? Sig Documenting Provider Last Dose Status Informant  acetaminophen (TYLENOL) 500 MG tablet [id number] No Take 1,000 mg by mouth every 6 (six) hours as needed for moderate pain or headache.  [provider] Taking Active Spouse/Significant Other  Alcohol Swabs (B-D SINGLE USE SWABS REGULAR) PADS [id number] No Use to check blood sugar up to 2 times daily [name], DO Taking Active Spouse/Significant Other  allopurinol (ZYLOPRIM) 100 MG tablet [id number] No Take 1 tablet (100 mg total) by mouth daily. Theora Gianotti, NP Taking Active   aspirin 81 MG chewable tablet [id number] No Chew 81 mg by mouth daily. [provider] Taking Active Spouse/Significant Other  Blood Glucose Calibration (OT ULTRA/FASTTK CNTRL SOLN) SOLN [id number]   [provider]  Active   Blood Glucose Monitoring Suppl (ONE TOUCH ULTRA 2) w/Device KIT [id number]  SMARTSIG:Via Meter [provider]  Active   bosutinib (BOSULIF) 100 MG tablet [id number] No Take 100 mg by mouth at bedtime. Take with food. [provider] Taking Active Spouse/Significant Other           Med Note Jeanie Cooks Sep 15, 2019  8:38 AM)    Cholecalciferol 25 MCG (1000 UT) capsule [id number] No Take 1,000 Units by mouth daily. [provider] Taking Active  Spouse/Significant Other  famotidine (PEPCID) 40 MG tablet [id number] No Take 1 tablet (40 mg total) by mouth 2 (two) times daily with a meal. [place], [name], DO Taking Active   gabapentin (NEURONTIN) 300 MG capsule [id number] No Take 1 capsule (300 mg total) by mouth 3 (three) times daily. [name], DO Taking Active   Insulin Glargine [facility] KWIKPEN) 100 UNIT/ML [id number] No Inject 20 Units into the skin daily. [provider] Taking Active   Insulin Pen Needle 31G X 5 MM MISC [id number] No Use to inject insulin daily. [name], DO Taking Active   isosorbide mononitrate (IMDUR) 60 MG 24 hr tablet [id number] No Take 0.5 tablets (30 mg total) by mouth daily. Theora Gianotti, NP Taking Active   JARDIANCE 10 MG TABS tablet [id number] No Take 1 tablet (10 mg total) by mouth daily with breakfast. [name], DO Taking Active   Lancets (ONETOUCH DELICA PLUS [id number]) [place] [id number] No Use to check blood sugar twice daily [name], DO Taking Active   levothyroxine (SYNTHROID) 25 MCG tablet [id number] No TAKE ONE TABLET BY MOUTH EVERY MORNING BEFORE BREAKFAST Karamalegos, [name], DO Taking Active   losartan (COZAAR) 25 MG tablet [id number] No TAKE 1/2 TABLET(12.5 MG) BY MOUTH DAILY End, [name], MD Taking Active   Menthol-Zinc Oxide (GOLD BOND EX) [id number] No Apply 1 application topically daily as needed (muscle pain). [provider] Taking Active Spouse/Significant Other  mirabegron ER (MYRBETRIQ) 25 MG TB24 tablet [id number]  Take 1 tablet (25 mg total)  by mouth daily. Debroah Loop, PA-C  Active   Multiple Vitamin (MULTIVITAMIN WITH MINERALS) TABS tablet [id number] No Take 1 tablet by mouth daily. [provider] Taking Active Spouse/Significant Other  nitroGLYCERIN (NITROSTAT) 0.4 MG SL tablet [id number] No Place 1 tablet (0.4 mg total) under the tongue every 5 (five) minutes x 3 doses  as needed for chest pain. Theora Gianotti, NP Taking Active Spouse/Significant Other  ONETOUCH VERIO test strip [id number] No Check blood sugar up to 2 x daily [name], DO Taking Active   Polyethyl Glycol-Propyl Glycol (SYSTANE OP) [id number] No Place 1 drop into both eyes 3 (three) times daily as needed (dry/irritated eyes.).  [provider] Taking Active Spouse/Significant Other  polyethylene glycol (MIRALAX / GLYCOLAX) 17 g packet [id number] No Take 17 g by mouth daily as needed for mild constipation.  [provider] Taking Active Spouse/Significant Other  ranolazine (RANEXA) 500 MG 12 hr tablet [id number] No TAKE ONE TABLET BY MOUTH TWICE DAILY End, [name], MD Taking Active   rosuvastatin (CRESTOR) 5 MG tablet [id number] No Take 1 tablet (5 mg total) by mouth daily. End, [name], MD Taking Active   tamsulosin (FLOMAX) 0.4 MG CAPS capsule [id number] No Take 1 capsule (0.4 mg total) by mouth daily after supper. Rise Mu, PA-C Taking Active Spouse/Significant Other  torsemide (DEMADEX) 20 MG tablet [id number]  Take 1 tablet (20 mg total) by mouth as directed. TAKE 2 tablets in the AM and 1 tablet in the evening, every other day. May take additional 1 tablet (20 mg) as needed for weight greater than 198 pounds. Theora Gianotti, NP  Active   traMADol Veatrice Bourbon) 50 MG tablet [id number] No Take 1-2 tablets (50-100 mg total) by mouth at bedtime as needed. [name], DO Taking Active Spouse/Significant Other  warfarin (COUMADIN) 3 MG tablet [id number] No TAKE 1 TO 1 & 1/2 TABLETS BY MOUTH AS DIRECTED BY THE COUMADIN CLINIC Strength: 3 mg End, [name], MD Taking Active             Pertinent Labs:  Lab Results  Component Value Date   HGBA1C 7.8 (H) 07/16/2021   Lab Results  Component Value Date   CHOL 163 07/06/2021   HDL 37 (L) 07/06/2021   LDLCALC 89 07/06/2021   TRIG 186 (H) 07/06/2021   CHOLHDL 4.4 07/06/2021    Lab Results  Component Value Date   CREATININE 1.42 (H) 08/17/2021   BUN 30 (H) 08/17/2021   NA 136 08/17/2021   K 4.2 08/17/2021   CL 101 08/17/2021   CO2 26 08/17/2021    SDOH:  (Social Determinants of Health) assessments and interventions performed:    [place]  Review of patient past medical history, allergies, medications, health status, including review of consultants reports, laboratory and other test data, was performed as part of comprehensive evaluation and provision of chronic care management services.   Care Plan : PharmD - Medication Management/Assistance  Updates made by Rennis Petty, RPH-CPP since 09/05/2021 12:00 AM     Problem: Disease Progression      Long-Range Goal: Disease Progression Prevented or Minimized   Start Date: 04/14/2020  Expected End Date: 07/13/2020  Recent Progress: On track  Priority: High  Note:   Current Barriers:  Chronic Disease Management support, education, and care coordination needs related to type 2 diabetes, CKD, HFrEF s/p CardioMEMS, hyperlipidemia, atrial fibrillation, hypertension, coronary artery disease, CHF, hypothyroidism, and CML Financial Note patient has Partial Extra Help/LIS Subsidy  Patient enrolled in [place] patient assistance program for 2023 calendar year Patient denied for patient assistance for Jaridance from [facility] for 2022 calendar year Patient previously enrolled in [place] for [place] copayment assistance, but eligibility expected to expire on 06/09/2021 as not in use (currently no copayment through Part D plan Limited vision  Pharmacist Clinical Goal(s):  Over the next 90 days, patient will verbalize ability to afford treatment regimen through collaboration with PharmD and provider.   Interventions: 1:1 collaboration with [name], DO regarding development and update of comprehensive plan of care as evidenced by provider attestation and  co-signature Inter-disciplinary care team collaboration (see longitudinal plan of care) Receive a message from CCM Nurse Case Manger advising patient's wife reached out yesterday as patient received refill of Trulicity from patient assistance program, but patient no longer taking Trulicity. CCM RNCM advises patient was instructed to take the medication to the office, but requests assistance from [facility] Pharmacist with unenrollment from Trulicity patient assistance program refills   Note previously advised patient on 3/77/9396 to contact Trulicity patient assistance program to [facility] for future refills CCM Pharmacist places phone call to [facility] patient assistance program today to advise program that patient no longer taking Trulicity/program to hold off on sending Trulicity refills Follow up with patient's wife to update and remind to continue to contact program as needed for refills of [name]    Patient Goals/Self-Care Activities Over the next 90 days, patient will:  Check blood sugar, blood pressure and fluid status as directed by providers  Take medications, with assistance of wife, as directed Mrs. Peschke manages patient's medications using weekly pillbox as adherence tool Attends scheduled medical appointments  Follow Up Plan: Telephone follow up appointment with care management team member scheduled for: 09/21/2021 at 99 am       [name], PharmD, [place], [facility] [place] [phone]

## 2021-09-10 DIAGNOSIS — M25511 Pain in right shoulder: Secondary | ICD-10-CM | POA: Diagnosis not present

## 2021-09-10 DIAGNOSIS — M75120 Complete rotator cuff tear or rupture of unspecified shoulder, not specified as traumatic: Secondary | ICD-10-CM | POA: Diagnosis not present

## 2021-09-10 DIAGNOSIS — M25512 Pain in left shoulder: Secondary | ICD-10-CM | POA: Diagnosis not present

## 2021-09-11 ENCOUNTER — Other Ambulatory Visit: Payer: Self-pay | Admitting: Internal Medicine

## 2021-09-12 DIAGNOSIS — R270 Ataxia, unspecified: Secondary | ICD-10-CM | POA: Diagnosis not present

## 2021-09-12 DIAGNOSIS — R2689 Other abnormalities of gait and mobility: Secondary | ICD-10-CM | POA: Diagnosis not present

## 2021-09-12 DIAGNOSIS — G629 Polyneuropathy, unspecified: Secondary | ICD-10-CM | POA: Diagnosis not present

## 2021-09-12 DIAGNOSIS — M79671 Pain in right foot: Secondary | ICD-10-CM | POA: Diagnosis not present

## 2021-09-12 DIAGNOSIS — M79672 Pain in left foot: Secondary | ICD-10-CM | POA: Diagnosis not present

## 2021-09-14 DIAGNOSIS — I2510 Atherosclerotic heart disease of native coronary artery without angina pectoris: Secondary | ICD-10-CM | POA: Diagnosis not present

## 2021-09-14 DIAGNOSIS — Z794 Long term (current) use of insulin: Secondary | ICD-10-CM

## 2021-09-14 DIAGNOSIS — N1831 Chronic kidney disease, stage 3a: Secondary | ICD-10-CM | POA: Diagnosis not present

## 2021-09-14 DIAGNOSIS — I509 Heart failure, unspecified: Secondary | ICD-10-CM | POA: Diagnosis not present

## 2021-09-14 DIAGNOSIS — J449 Chronic obstructive pulmonary disease, unspecified: Secondary | ICD-10-CM | POA: Diagnosis not present

## 2021-09-14 DIAGNOSIS — E1159 Type 2 diabetes mellitus with other circulatory complications: Secondary | ICD-10-CM | POA: Diagnosis not present

## 2021-09-14 DIAGNOSIS — I130 Hypertensive heart and chronic kidney disease with heart failure and stage 1 through stage 4 chronic kidney disease, or unspecified chronic kidney disease: Secondary | ICD-10-CM

## 2021-09-14 DIAGNOSIS — E1122 Type 2 diabetes mellitus with diabetic chronic kidney disease: Secondary | ICD-10-CM

## 2021-09-14 DIAGNOSIS — E785 Hyperlipidemia, unspecified: Secondary | ICD-10-CM | POA: Diagnosis not present

## 2021-09-15 ENCOUNTER — Other Ambulatory Visit: Payer: Self-pay | Admitting: Physician Assistant

## 2021-09-15 DIAGNOSIS — E1121 Type 2 diabetes mellitus with diabetic nephropathy: Secondary | ICD-10-CM

## 2021-09-17 NOTE — Telephone Encounter (Signed)
Requested Prescriptions  Pending Prescriptions Disp Refills  . glucose blood (ONETOUCH VERIO) test strip [Pharmacy Med Name: ONE TOUCH VERIO TEST ST(NEW)100S] 100 strip 0    Sig: USE TO TEST BLOOD SUGAR EVERY MORNING AND EVERY NIGHT AT BEDTIME     Endocrinology: Diabetes - Testing Supplies Passed - 09/15/2021 11:48 AM      Passed - Valid encounter within last 12 months    Recent Outpatient Visits          2 months ago Controlled type 2 diabetes mellitus with diabetic nephropathy, with long-term current use of insulin ([place])   [place], DO   3 months ago Diarrhea, unspecified type   [place], DO   4 months ago Nausea   [facility] [place], [place] W, NP   5 months ago Gastroesophageal reflux disease without esophagitis   [place], DO   6 months ago Type 2 diabetes mellitus with diabetic toe ulcer ([place])   [place], [name], DO      Future Appointments            In 1 week End, [name], MD [facility], [id number]

## 2021-09-21 ENCOUNTER — Telehealth: Payer: Self-pay | Admitting: Internal Medicine

## 2021-09-21 ENCOUNTER — Ambulatory Visit (INDEPENDENT_AMBULATORY_CARE_PROVIDER_SITE_OTHER): Payer: Medicare HMO | Admitting: Pharmacist

## 2021-09-21 DIAGNOSIS — E1121 Type 2 diabetes mellitus with diabetic nephropathy: Secondary | ICD-10-CM

## 2021-09-21 DIAGNOSIS — K219 Gastro-esophageal reflux disease without esophagitis: Secondary | ICD-10-CM

## 2021-09-21 MED ORDER — FAMOTIDINE 40 MG PO TABS: 40.0000 mg | ORAL_TABLET | Freq: Every day | ORAL | 0 refills | Status: DC

## 2021-09-21 MED ORDER — TORSEMIDE 20 MG PO TABS: 20.0000 mg | ORAL_TABLET | ORAL | 6 refills | Status: DC

## 2021-09-21 NOTE — Telephone Encounter (Signed)
Spoke with [name] from [facility]. Patient has listed different instructions from result notes. Please clarify.

## 2021-09-21 NOTE — Telephone Encounter (Signed)
Confirmed with provider and patient is to take Torsemide 40 mg in the AM and 20 mg in the PM daily with extra tablet for weight gain greater than 198 pounds. Will notify patient.

## 2021-09-21 NOTE — Chronic Care Management (AMB) (Signed)
Chronic Care Management CCM Pharmacy Note  09/21/2021 Name:  Lucas Caldwell MRN:  [id number] DOB:  [birth year]   Subjective: Lucas Caldwell is an 88 y.o. year old male who is a primary patient of [name], DO.  The CCM team was consulted for assistance with disease management and care coordination needs.    Engaged with patient and spouse by telephone for follow up visit for pharmacy case management and/or care coordination services.   Objective:  Medications Reviewed Today     Reviewed by Rennis Petty, RPH-CPP (Pharmacist) on 09/21/21 at 1017  Med List Status: <None>   Medication Order Taking? Sig Documenting Provider Last Dose Status Informant  acetaminophen (TYLENOL) 500 MG tablet [id number]  Take 1,000 mg by mouth every 6 (six) hours as needed for moderate pain or headache.  [provider]  Active Spouse/Significant Other  Alcohol Swabs (B-D SINGLE USE SWABS REGULAR) PADS [id number]  Use to check blood sugar up to 2 times daily [name], DO  Active Spouse/Significant Other  allopurinol (ZYLOPRIM) 100 MG tablet [id number]  Take 1 tablet (100 mg total) by mouth daily. Theora Gianotti, NP  Active   aspirin 81 MG chewable tablet [id number]  Chew 81 mg by mouth daily. [provider]  Active Spouse/Significant Other  Blood Glucose Calibration (OT ULTRA/FASTTK CNTRL SOLN) SOLN [id number]   [provider]  Active   Blood Glucose Monitoring Suppl (ONE TOUCH ULTRA 2) w/Device KIT [id number]  SMARTSIG:Via Meter [provider]  Active   bosutinib (BOSULIF) 100 MG tablet [id number] Yes Take 100 mg by mouth at bedtime. Take with food. [provider] Taking Active Spouse/Significant Other           Med Note Jeanie Cooks Sep 15, 2019  8:38 AM)    Cholecalciferol 25 MCG (1000 UT) capsule [id number]  Take 1,000 Units by mouth daily. [provider]  Active Spouse/Significant Other   famotidine (PEPCID) 40 MG tablet [id number]  Take 1 tablet (40 mg total) by mouth 2 (two) times daily with a meal.  Patient taking differently: Take 40 mg by mouth at bedtime.   Karamalegos, [name], DO  Active   gabapentin (NEURONTIN) 300 MG capsule [id number]  Take 1 capsule (300 mg total) by mouth 3 (three) times daily. [name], DO  Active   glucose blood [facility] VERIO) test strip [id number]  USE TO TEST BLOOD SUGAR EVERY MORNING AND EVERY NIGHT AT BEDTIME [name], DO  Active   Insulin Glargine ([name] KWIKPEN) 100 UNIT/ML [id number] Yes Inject 20 Units into the skin daily. [provider] Taking Active   Insulin Pen Needle 31G X 5 MM MISC [id number]  Use to inject insulin daily. Karamalegos, [name], DO  Active   isosorbide mononitrate (IMDUR) 60 MG 24 hr tablet [id number] Yes Take 0.5 tablets (30 mg total) by mouth daily. Theora Gianotti, NP Taking Active   JARDIANCE 10 MG TABS tablet [id number] Yes Take 1 tablet (10 mg total) by mouth daily with breakfast. [name], DO Taking Active   Lancets (ONETOUCH DELICA PLUS [id number]) [place] [id number]  Use to check blood sugar twice daily [name], DO  Active   levothyroxine (SYNTHROID) 25 MCG tablet [id number] Yes TAKE ONE TABLET BY MOUTH EVERY MORNING BEFORE BREAKFAST Karamalegos, [name], DO Taking Active   losartan (COZAAR) 25 MG tablet [id number] Yes TAKE 1/2 TABLET(12.5 MG) BY MOUTH DAILY End, [name], MD Taking  Active   Menthol-Zinc Oxide (GOLD BOND EX) [id number]  Apply 1 application topically daily as needed (muscle pain). [provider]  Active Spouse/Significant Other  mirabegron ER (MYRBETRIQ) 25 MG TB24 tablet [id number] Yes Take 1 tablet (25 mg total) by mouth daily. Debroah Loop, PA-C Taking Active   Multiple Vitamin (MULTIVITAMIN WITH MINERALS) TABS tablet [id number]  Take 1 tablet by mouth daily. [provider]  Active  Spouse/Significant Other  nitroGLYCERIN (NITROSTAT) 0.4 MG SL tablet [id number]  Place 1 tablet (0.4 mg total) under the tongue every 5 (five) minutes x 3 doses as needed for chest pain. Theora Gianotti, NP  Active Spouse/Significant Other  Polyethyl Glycol-Propyl Glycol (SYSTANE OP) [id number]  Place 1 drop into both eyes 3 (three) times daily as needed (dry/irritated eyes.).  [provider]  Active Spouse/Significant Other  polyethylene glycol (MIRALAX / GLYCOLAX) 17 g packet [id number]  Take 17 g by mouth daily as needed for mild constipation.  [provider]  Active Spouse/Significant Other  ranolazine (RANEXA) 500 MG 12 hr tablet [id number] Yes TAKE ONE TABLET BY MOUTH TWICE DAILY End, [name], MD Taking Active   rosuvastatin (CRESTOR) 5 MG tablet [id number] Yes Take 1 tablet (5 mg total) by mouth daily. End, [name], MD Taking Active   tamsulosin [facility]) 0.4 MG CAPS capsule [id number] Yes Take 1 capsule (0.4 mg total) by mouth daily after supper. Rise Mu, PA-C Taking Active Spouse/Significant Other  torsemide (DEMADEX) 20 MG tablet [id number] Yes Take 1 tablet (20 mg total) by mouth as directed. TAKE 2 tablets in the AM and 1 tablet in the evening, every other day. May take additional 1 tablet (20 mg) as needed for weight greater than 198 pounds. Theora Gianotti, NP Taking Active   traMADol Veatrice Bourbon) 50 MG tablet [id number] Yes Take 1-2 tablets (50-100 mg total) by mouth at bedtime as needed. [name], DO Taking Active Spouse/Significant Other  warfarin (COUMADIN) 3 MG tablet [id number]  TAKE 1 TO 1 & 1/2 TABLETS BY MOUTH AS DIRECTED BY THE COUMADIN CLINIC Strength: 3 mg End, [name], MD  Active             Pertinent Labs:  Lab Results  Component Value Date   HGBA1C 7.8 (H) 07/16/2021   Lab Results  Component Value Date   CHOL 163 07/06/2021   HDL 37 (L) 07/06/2021   LDLCALC 89 07/06/2021   TRIG 186 (H) 07/06/2021    CHOLHDL 4.4 07/06/2021   Lab Results  Component Value Date   CREATININE 1.42 (H) 08/17/2021   BUN 30 (H) 08/17/2021   NA 136 08/17/2021   K 4.2 08/17/2021   CL 101 08/17/2021   CO2 26 08/17/2021    SDOH:  (Social Determinants of Health) assessments and interventions performed:    [place]  Review of patient past medical history, allergies, medications, health status, including review of consultants reports, laboratory and other test data, was performed as part of comprehensive evaluation and provision of chronic care management services.   Care Plan : PharmD - Medication Management/Assistance  Updates made by Rennis Petty, RPH-CPP since 09/21/2021 12:00 AM     Problem: Disease Progression      Long-Range Goal: Disease Progression Prevented or Minimized   Start Date: 04/14/2020  Expected End Date: 07/13/2020  Recent Progress: On track  Priority: High  Note:   Current Barriers:  Chronic Disease Management support, education, and care coordination needs related to type 2 diabetes, CKD, HFrEF s/p  CardioMEMS, hyperlipidemia, atrial fibrillation, hypertension, coronary artery disease, CHF, hypothyroidism, and CML Financial Note patient has Partial Extra Help/LIS Subsidy Patient enrolled in [place] patient assistance program for 2023 calendar year Patient denied for patient assistance for Jaridance from [facility] for 2022 calendar year Patient previously enrolled in [place] for [employer] copayment assistance, but eligibility expected to expire on 06/09/2021 as not in use (currently no copayment through Part D plan Limited vision  Pharmacist Clinical Goal(s):  Over the next 90 days, patient will verbalize ability to afford treatment regimen through collaboration with PharmD and provider.   Interventions: 1:1 collaboration with [name], DO regarding development and update of comprehensive plan of care as evidenced by  provider attestation and co-signature Inter-disciplinary care team collaboration (see longitudinal plan of care) Perform chart review Patient seen for Office Visit with [facility] Neurology on 6/28. Provider advised patient: Continue Gabapentin 600 mg 2-3 times daily Continue tramadol 50 mg 1-2 times daily as needed for breakthrough pain Today patient's wife confirms received testing supplies from [facility] Comprehensive medication review performed; medication list updated in electronic medical record Caution for risk of dizziness or sedation with gabapentin and tramadol, particularly if used within the same day. Confirms patient using caution and lies down after taking tramadol Reports patient currently taking torsemide only every other day, taking both 2 tablets (40 mg) in the morning and 1 tablet (20 mg) in the afternoon every other day Denies noticing any increase in s/s of swelling since started taking this way last month Based on review of Cardiologist lab result note from 6/2 and 5/25 regarding torsemide instructions, contact [place] today to request clarification of torsemide direction. Leave message with Chloe requesting call back or call back to patient regarding this dosing Renal dosing adjustments: Based on latest creatine lab value of 1.42 mg/dL on 08/17/2021, calculate CrCl (using adjusted body weight) of ~38 ml/min  Gabapentin package labeling recommends for CrCl of 30-59 mL/min, maximum dose of 1400 mg/day Advise caregiver/patient, as patient now receiving gabapentin 600 mg tablets, not to take gabapentin 600 mg more than twice daily For famotidine, for CrCl 30 to 60 mL/min, for usual doses of 40 mg orally twice daily, recommended to reduce to 40 mg once daily  Spouse/patient advised to reduce dose accordingly to famotidine 40 mg daily. Spouse confirms patient taking famotidine 40 mg daily in the evening CPP will send updated Rx to pharmacy to  reflect this dosing  Type 2 Diabetes: Current treatment: [name] 20 units daily Jardiance 10 mg once daily in morning Previous therapies tried: Trulicity (stopped due to concern could be contributing to diarrhea) Reports recent fasting blood sugars readings:  Morning Fasting Blood Sugar   1 - July 154  2 - July 144  3 - July 194  4 - July 188  5 - July 160  6 - July -  7 - July 166   Attributes recent elevated readings to carbohydrate portion sizes Counsel on importance of having regular well-balanced meals, while controlling carbohydrate portion sizes Have counsel on signs of hypoglycemia and how to manage low blood sugar Exercise: reports patient walks with walker/cane ~10 minutes most days of the week Discuss importance of fall prevention/consistently using walker Counsel to continue to monitor home blood sugar, keep a log of results, bring this record to medical appointments and call office sooner if needed for readings outside of established parameters or symptoms Remind patient's wife to follow up with [facility] [phone]) when patient in need of further diabetes testing supplies   CHF: Note patient followed by [facility] and the advanced heart failure clinic, including remotely with cardioMEMS Current treatment: Torsemide 20 mg - Reports patient currently taking torsemide only every other day, taking both 2 tablets (40 mg) in the morning and 1 tablet (20 mg) in the afternoon every other day Contacted Cardiology for clarification. See above Jardiance 10 mg daily Losartan 25 mg -  tablet (12.5 mg) daily Encourage patient to continue to weigh daily and keep log and continue to use cardioMEMS monitoring   Patient Goals/Self-Care Activities Over the next 90 days, patient will:  Check blood sugar, blood pressure and fluid status as directed by providers  Take medications, with assistance of wife, as directed Mrs. Bingaman manages patient's medications  using weekly pillbox as adherence tool Attends scheduled medical appointments  Follow Up Plan: Telephone follow up appointment with care management team member scheduled for: 10/31/2021 at 10:00 AM       [name], PharmD, [place], May [phone]

## 2021-09-21 NOTE — Telephone Encounter (Signed)
Pt c/o medication issue:  1. Name of Medication: torsemide (DEMADEX) 20 MG tablet  2. How are you currently taking this medication (dosage and times per day)? 2 in the AM and 1 in PM every other day  3. Are you having a reaction (difficulty breathing--STAT)? no  4. What is your medication issue? Elizabeth from [place] called stating that there is some confusion on how the pt should be taking these medication. She states that he is taking 2 in the morning and 1 in the evening every other day. Please advise Benjamine Mola states that if she doesn't answer, call the pt back.

## 2021-09-21 NOTE — Patient Instructions (Signed)
Visit Information  Thank you for taking time to visit with me today. Please don't hesitate to contact me if I can be of assistance to you before our next scheduled telephone appointment.  Following are the goals we discussed today:   Goals Addressed             This Visit's Progress    Pharmacy Goals       Please check your blood sugar, keep a log of the results and bring this with you to your medical appointments.  Please check your home blood pressure, keep a log of the results and bring this with you to your medical appointments.  Our goal bad cholesterol, or LDL, is less than 70 . This is why it is important to continue taking your rosuvastatin.  Feel free to call me with any questions or concerns. I look forward to our next call!    [name], PharmD, [place] [phone]          Our next appointment is by telephone on 10/31/2021 at 10:00 AM  Please call the care guide team at [phone] if you need to cancel or reschedule your appointment.    The patient verbalized understanding of instructions, educational materials, and care plan provided today and DECLINED offer to receive copy of patient instructions, educational materials, and care plan.

## 2021-09-21 NOTE — Telephone Encounter (Signed)
Spoke with patients wife per release form and she reports patient has been doing well. Weights she provided were 190, 192, 191. Confirmed upcoming appointment and instructions to have patient take Torsemide 20 mg 2 tablets (40 mg) in the AM and 1 tablet (20 mg) in the PM with extra pill as needed for weight gain greater than 198. She read back instructions confirming every day. She verbalized understanding of instructions, agreement with plan, and no further questions at this time.

## 2021-09-24 ENCOUNTER — Other Ambulatory Visit: Payer: Self-pay | Admitting: Nurse Practitioner

## 2021-09-26 ENCOUNTER — Ambulatory Visit (INDEPENDENT_AMBULATORY_CARE_PROVIDER_SITE_OTHER): Payer: Medicare HMO | Admitting: Internal Medicine

## 2021-09-26 ENCOUNTER — Other Ambulatory Visit
Admission: RE | Admit: 2021-09-26 | Discharge: 2021-09-26 | Disposition: A | Discharge status: Home / Self Care | Payer: Medicare HMO | Source: Ambulatory Visit | Attending: Internal Medicine | Admitting: Internal Medicine

## 2021-09-26 ENCOUNTER — Ambulatory Visit (INDEPENDENT_AMBULATORY_CARE_PROVIDER_SITE_OTHER): Payer: Medicare HMO

## 2021-09-26 ENCOUNTER — Encounter: Payer: Self-pay | Admitting: Internal Medicine

## 2021-09-26 ENCOUNTER — Other Ambulatory Visit: Payer: Self-pay | Admitting: Family Medicine

## 2021-09-26 VITALS — BP 110/60 | HR 93 | Ht 64.0 in | Wt 193.6 lb

## 2021-09-26 DIAGNOSIS — Z794 Long term (current) use of insulin: Secondary | ICD-10-CM

## 2021-09-26 DIAGNOSIS — I5022 Chronic systolic (congestive) heart failure: Secondary | ICD-10-CM | POA: Diagnosis not present

## 2021-09-26 DIAGNOSIS — Z7901 Long term (current) use of anticoagulants: Secondary | ICD-10-CM

## 2021-09-26 DIAGNOSIS — I480 Paroxysmal atrial fibrillation: Secondary | ICD-10-CM

## 2021-09-26 LAB — BASIC METABOLIC PANEL
Anion gap: 12 (ref 5–15)
BUN: 27 mg/dL — ABNORMAL HIGH (ref 8–23)
CO2: 24 mmol/L (ref 22–32)
Calcium: 9 mg/dL (ref 8.9–10.3)
Chloride: 99 mmol/L (ref 98–111)
Creatinine, Ser: 1.66 mg/dL — ABNORMAL HIGH (ref 0.61–1.24)
GFR, Estimated: 40 mL/min — ABNORMAL LOW (ref >60)
Glucose, Bld: 219 mg/dL — ABNORMAL HIGH (ref 70–99)
Potassium: 3.6 mmol/L (ref 3.5–5.1)
Sodium: 135 mmol/L (ref 135–145)

## 2021-09-26 LAB — CBC
HCT: 43 % (ref 39.0–52.0)
Hemoglobin: 14.2 g/dL (ref 13.0–17.0)
MCH: 28.3 pg (ref 26.0–34.0)
MCHC: 33 g/dL (ref 30.0–36.0)
MCV: 85.8 fL (ref 80.0–100.0)
Platelets: 157 10*3/uL (ref 150–400)
RBC: 5.01 MIL/uL (ref 4.22–5.81)
RDW: 14.7 % (ref 11.5–15.5)
WBC: 6.8 10*3/uL (ref 4.0–10.5)
nRBC: 0 % (ref 0.0–0.2)

## 2021-09-26 LAB — POCT INR: INR: 2.3 (ref 2.0–3.0)

## 2021-09-26 MED ORDER — TORSEMIDE 20 MG PO TABS
20.0000 mg | ORAL_TABLET | ORAL | Status: DC
Start: 2021-09-26 — End: 2021-09-26

## 2021-09-26 MED ORDER — TORSEMIDE 20 MG PO TABS
20.0000 mg | ORAL_TABLET | ORAL | Status: DC
Start: 2021-09-26 — End: 2021-10-23

## 2021-09-26 NOTE — Telephone Encounter (Signed)
Requested medication (s) are due for refill today: no  Requested medication (s) are on the active medication list: yes  Last refill:  09/17/21  Future visit scheduled: yes  Notes to clinic:  Unable to refill per protocol, last refill by provider on 09/17/21 for 100 strips, 0 refills. Possible duplicate request.     Requested Prescriptions  Pending Prescriptions Disp Refills   glucose blood (ONETOUCH VERIO) test strip 100 strip 0    Sig: USE TO TEST BLOOD SUGAR EVERY MORNING AND EVERY NIGHT AT BEDTIME     Endocrinology: Diabetes - Testing Supplies Passed - 09/26/2021  2:33 PM      Passed - Valid encounter within last 12 months    Recent Outpatient Visits           2 months ago Controlled type 2 diabetes mellitus with diabetic nephropathy, with long-term current use of insulin [facility])   [place], DO   4 months ago Diarrhea, unspecified type   [place], DO   4 months ago Nausea   [facility] [place], [place] W, NP   5 months ago Gastroesophageal reflux disease without esophagitis   [place], DO   6 months ago Type 2 diabetes mellitus with diabetic toe ulcer ([place])   [facility], [name], DO       Future Appointments             Today End, [name], MD [facility] [place]

## 2021-09-26 NOTE — Progress Notes (Signed)
Follow-up Outpatient Visit Date: 09/26/2021  Primary Care Provider: [name], DO [address] [address]  Chief Complaint: Follow-up CAD, heart failure, and atrial fibrillation  HPI:  Lucas Caldwell is a 88 y.o. male with history of CAD status post prior RCA, circumflex, and LAD stenting, ischemic cardiomyopathy, chronic combined systolic and diastolic congestive heart failure, hypertension, hyperlipidemia, diabetes, paroxysmal atrial fibrillation (on warfarin), stage III-IV chronic kidney disease, CML, iron deficiency anemia, GI bleed due to duodenal ulcers (12/2017), BPH, hypothyroidism, and peripheral neuropathy with unsteady gait, who presents for follow-up of heart failure and coronary artery disease.  He was last seen in our office just over a month ago by [name], NP.  Volume management had been difficult leading to adjustments in his torsemide.  CardioMEMS readings have been trending above goal of 15.  He was advised to continue with torsemide 40 mg every morning and 20 mg every afternoon and to take an additional 20 mg for weight gain.  Today, Mr. Buysse complains of feeling dizzier than usual.  He feels like he is urinating a lot on twice daily torsemide.  He worries that he is becoming dehydrated.  He drinks several Pepsi's during the day but otherwise does not take in much fluid.  He has not passed out or fallen.  He still feels quite tired with any activity.  Chronic exertional dyspnea is stable.  He denies frank chest pain but at times feels like something is moving in his chest.  He has not had any bleeding but feels like his stool might be a little darker than usual.  He remains compliant with warfarin.  --------------------------------------------------------------------------------------------------  Past Medical History:  Diagnosis Date   Arthritis    "probably in his legs" (08/15/2016)   Blind right eye    BPH (benign prostatic hyperplasia)     Breast asymmetry    Left breast is larger, present for several years.   CAD (coronary artery disease)    a. Cath in the late 90's - reportedly ok;  b. 2014 s/p stenting x 2 @ [facility]; c 08/19/16 Cath/PCI with DES -> RCA, plan to treat LM 70% medically. Seen by surgery and felt to be too high risk for CABG; d. 08/2018 Cath: LM 70 (iFR 0.86), LAD 20ost, 40p, 50/55m D1 70, LCX patent stent, OM1 30, RCA patent stent, 457mSR, 40d, RPDA 30. PCWP 8. CO/CI 4.0/2.1; e. 03/2019 PCI to LAD (2.5x15 Resolute Onyx DES).   Chronic combined systolic (congestive) and diastolic (congestive) heart failure ([facility]   a. Previously reduced EF-->50% by echo in 2012;  b. 06/2015 Echo: EF 50-55%  c. 07/2016 Echo: EF 45-50%; d. 12/2017 Echo: EF 55%; e. 08/2018 Echo: EF 35-40%; f. 04/2019 Echo: EF 30-35%; g. s/p cardiomems; h. 10/2020 Echo: EF 30-35%, mod-sev glob HK, gr1 DD.   Chronic kidney disease (CKD), stage IV (severe) (HCC)    CML (chronic myelocytic leukemia) (HCC)    GERD (gastroesophageal reflux disease)    GIB (gastrointestinal bleeding)    a. 12/2017 3 unit PRBC GIB in setting of coumadin-->Endo/colon multiple duodenal ulcers and a single bleeding ulcer in the proximal ascending colon status post hemostatic clipping x2.   Gout    Hyperlipemia    Hypertension    Hypothyroidism    Iron deficiency anemia    Ischemic cardiomyopathy    a. Previously reduced EF-->50% by echo in 2012;  b. 06/2015 Echo: EF 50-55%, Gr2 DD;  c. 07/2016 Echo: EF 45-50%;  d. 12/2017 Echo: EF 55%, Gr1 DD, mild MR; e. 08/2018 Echo: EF 35-40%; f. 04/2019 Echo: EF 30-35%; g. 10/2020 Echo: EF 30-35%.   Migraine    "in the 1960s" (08/15/2016)   Obstructive sleep apnea    Orthostatic hypotension    a. 20222 -->carvedilol and hydralazine d/c'd.   PAF (paroxysmal atrial fibrillation) (HCC)    a. ? Dx 2014-->s/p DCCV;  b. CHA2DS2VASc = 6--> Coumadin.   Prostate cancer ([place])    RBBB    Type II diabetes mellitus [facility])    Past Surgical History:  Procedure  Laterality Date   AMPUTATION TOE Right 02/23/2021   Procedure: AMPUTATION TOE;  Surgeon: Samara Deist, DPM;  Location: [facility] ORS;  Service: Podiatry;  Laterality: Right;   CATARACT EXTRACTION W/ INTRAOCULAR LENS  IMPLANT, BILATERAL Bilateral    COLONOSCOPY N/A 01/13/2018   Procedure: COLONOSCOPY;  Surgeon: Lin Landsman, MD;  Location: [facility] ENDOSCOPY;  Service: Gastroenterology;  Laterality: N/A;   COLONOSCOPY WITH PROPOFOL N/A 01/01/2018   Procedure: COLONOSCOPY WITH PROPOFOL;  Surgeon: Lin Landsman, MD;  Location: [facility] ENDOSCOPY;  Service: Gastroenterology;  Laterality: N/A;   CORONARY ANGIOPLASTY WITH STENT PLACEMENT  09/2012   2 stents   CORONARY STENT INTERVENTION N/A 08/19/2016   Procedure: Coronary Stent Intervention;  Surgeon: [name], MD;  Location: [place] CV LAB;  Service: Cardiovascular;  Laterality: N/A;   CORONARY STENT INTERVENTION N/A 04/13/2019   Procedure: CORONARY STENT INTERVENTION;  Surgeon: [name], MD;  Location: [place] CV LAB;  Service: Cardiovascular;  Laterality: N/A;   ESOPHAGEAL MANOMETRY N/A 12/16/2017   Procedure: ESOPHAGEAL MANOMETRY (EM);  Surgeon: Lin Landsman, MD;  Location: [facility] ENDOSCOPY;  Service: Gastroenterology;  Laterality: N/A;   ESOPHAGOGASTRODUODENOSCOPY N/A 12/20/2016   Procedure: ESOPHAGOGASTRODUODENOSCOPY (EGD);  Surgeon: Lin Landsman, MD;  Location[facility] END[facility];  Service: Gastroenterology;  Laterality: N/A;   ESOPHAGOGASTRODUODENOSCOPY N/A 01/13/2018   Procedure: ESOPHAGOGASTRODUODENOSCOPY (EGD);  Surgeon: Lin Landsman, MD;  Location: [facility] ENDOSCOPY;  Service: Gastroenterology;  Laterality: N/A;   ESOPHAGOGASTRODUODENOSCOPY (EGD) WITH PROPOFOL N/A 11/03/2017   Procedure: ESOPHAGOGASTRODUODENOSCOPY (EGD) WITH PROPOFOL;  Surgeon: Lin Landsman, MD;  Location: [facility] ENDOSCOPY;  Service: Gastroenterology;  Laterality: N/A;   EYE SURGERY     HERNIA REPAIR     "[place]"   LEFT HEART CATH  AND CORONARY ANGIOGRAPHY N/A 08/14/2016   Procedure: Left Heart Cath and Coronary Angiography;  Surgeon: [name], MD;  Location: [place] CV LAB;  Service: Cardiovascular;  Laterality: N/A;   PRESSURE SENSOR/CARDIOMEMS N/A 09/23/2019   Procedure: PRESSURE SENSOR/CARDIOMEMS;  Surgeon: Jolaine Artist, MD;  Location: [place] CV LAB;  Service: Cardiovascular;  Laterality: N/A;   RIGHT HEART CATH N/A 09/23/2019   Procedure: RIGHT HEART CATH;  Surgeon: Jolaine Artist, MD;  Location: [place] CV LAB;  Service: Cardiovascular;  Laterality: N/A;   RIGHT/LEFT HEART CATH AND CORONARY ANGIOGRAPHY N/A 09/01/2018   Procedure: RIGHT/LEFT HEART CATH AND CORONARY ANGIOGRAPHY;  Surgeon: [name], MD;  Location: [place] CV LAB;  Service: Cardiovascular;  Laterality: N/A;   RIGHT/LEFT HEART CATH AND CORONARY ANGIOGRAPHY N/A 04/13/2019   Procedure: RIGHT/LEFT HEART CATH AND CORONARY ANGIOGRAPHY;  Surgeon: [name], MD;  Location: [place] CV LAB;  Service: Cardiovascular;  Laterality: N/A;   TOE AMPUTATION Right    "big toe"    Current Meds  Medication Sig   acetaminophen (TYLENOL) 500 MG tablet Take 1,000 mg by mouth every 6 (six) hours as needed for moderate  pain or headache.    Alcohol Swabs (B-D SINGLE USE SWABS REGULAR) PADS Use to check blood sugar up to 2 times daily   allopurinol (ZYLOPRIM) 100 MG tablet Take 1 tablet (100 mg total) by mouth daily.   aspirin 81 MG chewable tablet Chew 81 mg by mouth daily.   Blood Glucose Calibration (OT ULTRA/FASTTK CNTRL SOLN) SOLN    Blood Glucose Monitoring Suppl (ONE TOUCH ULTRA 2) w/Device KIT SMARTSIG:Via Meter   bosutinib (BOSULIF) 100 MG tablet Take 100 mg by mouth at bedtime. Take with food.   Cholecalciferol 25 MCG (1000 UT) capsule Take 1,000 Units by mouth daily.   famotidine (PEPCID) 40 MG tablet Take 1 tablet (40 mg total) by mouth at bedtime.   gabapentin (NEURONTIN) 600 MG tablet Take 600 mg by mouth 2  (two) times daily.   glucose blood (ONETOUCH VERIO) test strip USE TO TEST BLOOD SUGAR EVERY MORNING AND EVERY NIGHT AT BEDTIME   Insulin Glargine ([name] KWIKPEN) 100 UNIT/ML Inject 20 Units into the skin daily.   Insulin Pen Needle 31G X 5 MM MISC Use to inject insulin daily.   isosorbide mononitrate (IMDUR) 60 MG 24 hr tablet TAKE 1/2 TABLET(30 MG) BY MOUTH DAILY   JARDIANCE 10 MG TABS tablet Take 1 tablet (10 mg total) by mouth daily with breakfast.   Lancets (ONETOUCH DELICA PLUS [id number]) MISC Use to check blood sugar twice daily   levothyroxine (SYNTHROID) 25 MCG tablet TAKE ONE TABLET BY MOUTH EVERY MORNING BEFORE BREAKFAST   losartan (COZAAR) 25 MG tablet TAKE 1/2 TABLET(12.5 MG) BY MOUTH DAILY   Menthol-Zinc Oxide (GOLD BOND EX) Apply 1 application topically daily as needed (muscle pain).   mirabegron ER (MYRBETRIQ) 25 MG TB24 tablet Take 1 tablet (25 mg total) by mouth daily.   Multiple Vitamin (MULTIVITAMIN WITH MINERALS) TABS tablet Take 1 tablet by mouth daily.   nitroGLYCERIN (NITROSTAT) 0.4 MG SL tablet Place 1 tablet (0.4 mg total) under the tongue every 5 (five) minutes x 3 doses as needed for chest pain.   Polyethyl Glycol-Propyl Glycol (SYSTANE OP) Place 1 drop into both eyes 3 (three) times daily as needed (dry/irritated eyes.).    polyethylene glycol (MIRALAX / GLYCOLAX) 17 g packet Take 17 g by mouth daily as needed for mild constipation.    ranolazine (RANEXA) 500 MG 12 hr tablet TAKE ONE TABLET BY MOUTH TWICE DAILY   rosuvastatin (CRESTOR) 5 MG tablet Take 1 tablet (5 mg total) by mouth daily.   tamsulosin (FLOMAX) 0.4 MG CAPS capsule Take 1 capsule (0.4 mg total) by mouth daily after supper.   torsemide (DEMADEX) 20 MG tablet Take 1 tablet (20 mg total) by mouth as directed. TAKE 2 tablets in the AM and 1 tablet in the evening every day. May take additional 1 tablet (20 mg) as needed for weight greater than 198 pounds.   traMADol (ULTRAM) 50 MG tablet Take 1-2 tablets  (50-100 mg total) by mouth at bedtime as needed.   warfarin (COUMADIN) 3 MG tablet TAKE 1 TO 1 & 1/2 TABLETS BY MOUTH AS DIRECTED BY THE COUMADIN CLINIC Strength: 3 mg    Allergies: Clopidogrel, Ciprofloxacin, Beta adrenergic blockers, Mirtazapine, and Spironolactone  Social History   Tobacco Use   Smoking status: Former    Packs/day: 1.75    Years: 20.00    Total pack years: 35.00    Types: Cigarettes    Start date: 23    Quit date: 1975    Years since  quitting: 48.5   Smokeless tobacco: Former  Scientific laboratory technician Use: Never used  Substance Use Topics   Alcohol use: No    Comment: previously drank heavily - quit 1979.   Drug use: No    Family History  Problem Relation Age of Onset   Brain cancer Father    Diabetes Sister    Heart attack Sister     Review of Systems: A 12-system review of systems was performed and was negative except as noted in the HPI.  --------------------------------------------------------------------------------------------------  Physical Exam: BP 110/60 (BP Location: Left Arm, Patient Position: Sitting, Cuff Size: Large)   Pulse 93   Ht '5\' 4"'  (1.626 m)   Wt 193 lb 9.6 oz (87.8 kg)   SpO2 98%   BMI 33.23 kg/m   General:  NAD.  [name] accompanied by his wife. Neck: No JVD or HJR. Lungs: Clear to auscultation bilaterally without wheezes or crackles. Heart: Regular rate and rhythm without murmurs, rubs, or gallops. Abdomen: Soft, nontender, nondistended. Extremities: Trace ankle edema bilaterally.  EKG:  Normal sinus rhythm with bifascicular block (RBBB and LAFB).  Compared with prior tracing from 08/17/2021, PVC's are no long present.  Otherwise, no significant interval change.  Lab Results  Component Value Date   WBC 14.1 (H) 02/24/2021   HGB 11.9 (L) 02/24/2021   HCT 37.2 (L) 02/24/2021   MCV 84.0 02/24/2021   PLT 202 02/24/2021    Lab Results  Component Value Date   NA 136 08/17/2021   K 4.2 08/17/2021   CL 101 08/17/2021    CO2 26 08/17/2021   BUN 30 (H) 08/17/2021   CREATININE 1.42 (H) 08/17/2021   GLUCOSE 173 (H) 08/17/2021   ALT 10 02/22/2021    Lab Results  Component Value Date   CHOL 163 07/06/2021   HDL 37 (L) 07/06/2021   LDLCALC 89 07/06/2021   TRIG 186 (H) 07/06/2021   CHOLHDL 4.4 07/06/2021    --------------------------------------------------------------------------------------------------  ASSESSMENT AND PLAN: Chronic HFrEF: Mr. Albea appears fairly euvolemic on exam today.  His weight is also down to 2 to 3 pounds from priors, suggesting that he may be a little bit intravascularly volume depleted.  I do not have any recent CardioMEMS readings available for review and will reach out to our heart failure team to see what his PA pressures have been running.  In the meantime, I have asked him to hold his afternoon dose of torsemide unless he notices significant edema or his weight goes back above 195 pounds.  We will continue current doses of losartan and empagliflozin.  Mr. Vasek has been intolerant of beta-blockers in the past.  Coronary artery disease with stable angina: Mr. Falkner does not report frank angina, though it is possible that his chronic exertional dyspnea may be a manifestation of his underlying CAD.  Of note, his symptoms did not improve noticeably after PCI to the LAD 2 years ago.  For now, we will continue ranolazine and isosorbide mononitrate for antianginal therapy.  We discussed noninvasive ischemia testing and catheterization, though both options are suboptimal.  We will therefore defer additional testing at this time.  Chronic kidney disease: I will check a BMP today to ensure that he does not have acute kidney injury superimposed on chronic kidney disease in the setting of aggressive diuresis.  Avoid nephrotoxic agents, if possible.  Paroxysmal atrial fibrillation on warfarin: INRs have been therapeutic.  No frank bleeding reported by Mr. Hainline, though stools are  a  little bit darker than usual.  Given his fatigue and lightheadedness, we will check a CBC today to ensure that he is not developing worsening anemia that could be contributing to his symptoms.  Hyperlipidemia associated with type 2 diabetes mellitus: Continue rosuvastatin 5 mg daily.  Though LDL is suboptimal, Mr. Bellard has not tolerated more aggressive lipid therapy in the past due to myalgias.  Follow-up: Return to clinic in 1 month with me or [name], NP.  [name], MD 09/26/2021 4:31 PM

## 2021-09-26 NOTE — Patient Instructions (Signed)
Medication Instructions:   Your physician has recommended you make the following change in your medication:   CONTINUE Torsemide 40 mg in the morning and HOLD afternoon dose at this time  MAY TAKE and extra Torsemide 20 mg in the [place] if your weight is GREATER than 195 lbs  *If you need a refill on your cardiac medications before your next appointment, please call your pharmacy*   Lab Work:  Today at the [place] at [facility]: [place], BMET  -  Please go to the [place] [facility] [place] in at the Registration Desk: 1st desk to the right, past the screening table  If you have labs (blood work) drawn today and your tests are completely normal, you will receive your results only by: [place] (if you have MyChart) OR A paper copy in the mail If you have any lab test that is abnormal or we need to change your treatment, we will call you to review the results.   Testing/Procedures:  None ordered   Follow-Up: At [facility], you and your health needs are our priority.  As part of our continuing mission to provide you with exceptional heart care, we have created designated Provider Care Teams.  These Care Teams include your primary Cardiologist (physician) and Advanced Practice Providers (APPs -  Physician Assistants and Nurse Practitioners) who all work together to provide you with the care you need, when you need it.  We recommend signing up for the patient portal called "MyChart".  Sign up information is provided on this After Visit Summary.  MyChart is used to connect with patients for Virtual Visits (Telemedicine).  Patients are able to view lab/test results, encounter notes, upcoming appointments, etc.  Non-urgent messages can be sent to your provider as well.   To learn more about what you can do with MyChart, go to [email or web address].    Your next appointment:   1 month(s)  The format for your next appointment:   In Person  Provider:   You  may see [name], MD or one of the following Advanced Practice Providers on your designated Care Team:   [name], NP    Important Information About Sugar

## 2021-09-26 NOTE — Patient Instructions (Signed)
Description   Continue dosage of warfarin of 1.5 TABLETS EVERY DAY EXCEPT 1 TABLETS ON MONDAYS & FRIDAYS.  Recheck INR in 6 weeks

## 2021-09-26 NOTE — Telephone Encounter (Signed)
Medication Refill - Medication: one touch verio strips  Has the patient contacted their pharmacy? Yes.    (Agent: If yes, when and what did the pharmacy advise?) call provider  Preferred Pharmacy (with phone number or street name):  [facility] - [place], [place] Phone:  [phone]  Fax:  [phone]     Has the patient been seen for an appointment in the last year OR does the patient have an upcoming appointment? Yes.    Agent: Please be advised that RX refills may take up to 3 business days. We ask that you follow-up with your pharmacy.

## 2021-09-27 ENCOUNTER — Telehealth: Payer: Self-pay | Admitting: *Deleted

## 2021-09-27 NOTE — Telephone Encounter (Signed)
Attempted to call pt. No answer. Lmtcb.  

## 2021-09-27 NOTE — Telephone Encounter (Signed)
-----   Message from [name], MD sent at 09/26/2021  9:16 PM EDT ----- Please let Lucas Caldwell know that his kidney function is slightly worse than baseline, which suggest that he may be a little bit dehydrated.  His blood counts, including hemoglobin, are normal.  I recommend that we proceed with the plan discussed in the office today with discontinuation of his afternoon dose of torsemide unless his weight increases above 195 pounds.  We will follow-up with him after receiving recent CardioMMEMS readings from the heart failure clinic.

## 2021-09-28 ENCOUNTER — Telehealth: Payer: Self-pay | Admitting: Family Medicine

## 2021-09-28 ENCOUNTER — Other Ambulatory Visit: Payer: Self-pay | Admitting: Family Medicine

## 2021-09-28 DIAGNOSIS — G4733 Obstructive sleep apnea (adult) (pediatric): Secondary | ICD-10-CM | POA: Diagnosis not present

## 2021-09-28 DIAGNOSIS — E1121 Type 2 diabetes mellitus with diabetic nephropathy: Secondary | ICD-10-CM

## 2021-09-28 MED ORDER — ONETOUCH VERIO VI STRP: ORAL_STRIP | 0 refills | Status: DC

## 2021-09-28 NOTE — Telephone Encounter (Signed)
Attempted to call pharmacy to verify Rx 09/17/21 #100- on hold for 10 minutes- had to disconnect. Rx forwarded to pharmacy Requested Prescriptions  Pending Prescriptions Disp Refills  . glucose blood (ONETOUCH VERIO) test strip 100 strip 0    Sig: USE TO TEST BLOOD SUGAR EVERY MORNING AND EVERY NIGHT AT BEDTIME     Endocrinology: Diabetes - Testing Supplies Passed - 09/28/2021  4:18 PM      Passed - Valid encounter within last 12 months    Recent Outpatient Visits          2 months ago Controlled type 2 diabetes mellitus with diabetic nephropathy, with long-term current use of insulin [facility])   [place], DO   4 months ago Diarrhea, unspecified type   [place], DO   4 months ago Nausea   [facility] [place], [place] W, NP   5 months ago Gastroesophageal reflux disease without esophagitis   [place], DO   6 months ago Type 2 diabetes mellitus with diabetic toe ulcer ([place])   [place], [name], DO      Future Appointments            In 1 month End, [name], MD [id number]

## 2021-09-28 NOTE — Telephone Encounter (Signed)
Medication Refill - Medication: glucose blood (ONETOUCH VERIO) test strip [[id number]]  Pts wife reports that the pt is out of strips.  Has the patient contacted their pharmacy? Yes.   (Agent: If no, request that the patient contact the pharmacy for the refill. If patient does not wish to contact the pharmacy document the reason why and proceed with request.) (Agent: If yes, when and what did the pharmacy advise?)  Preferred Pharmacy (with phone number or street name):  [facility], [place] - [address] AT [facility]  [place] [address]  Phone: [phone] Fax: [phone]  Hours: Not open 24 hours   Has the patient been seen for an appointment in the last year OR does the patient have an upcoming appointment? Yes.    Agent: Please be advised that RX refills may take up to 3 business days. We ask that you follow-up with your pharmacy.

## 2021-10-01 ENCOUNTER — Ambulatory Visit: Payer: Self-pay

## 2021-10-01 ENCOUNTER — Ambulatory Visit: Payer: Self-pay | Admitting: *Deleted

## 2021-10-01 ENCOUNTER — Telehealth: Payer: Medicare HMO

## 2021-10-01 ENCOUNTER — Other Ambulatory Visit: Payer: Self-pay | Admitting: Family Medicine

## 2021-10-01 DIAGNOSIS — E785 Hyperlipidemia, unspecified: Secondary | ICD-10-CM

## 2021-10-01 DIAGNOSIS — I5032 Chronic diastolic (congestive) heart failure: Secondary | ICD-10-CM

## 2021-10-01 DIAGNOSIS — I5023 Acute on chronic systolic (congestive) heart failure: Secondary | ICD-10-CM

## 2021-10-01 DIAGNOSIS — I25118 Atherosclerotic heart disease of native coronary artery with other forms of angina pectoris: Secondary | ICD-10-CM

## 2021-10-01 DIAGNOSIS — I10 Essential (primary) hypertension: Secondary | ICD-10-CM

## 2021-10-01 DIAGNOSIS — E1169 Type 2 diabetes mellitus with other specified complication: Secondary | ICD-10-CM

## 2021-10-01 DIAGNOSIS — E11621 Type 2 diabetes mellitus with foot ulcer: Secondary | ICD-10-CM

## 2021-10-01 DIAGNOSIS — J432 Centrilobular emphysema: Secondary | ICD-10-CM

## 2021-10-01 DIAGNOSIS — R0602 Shortness of breath: Secondary | ICD-10-CM

## 2021-10-01 DIAGNOSIS — Z794 Long term (current) use of insulin: Secondary | ICD-10-CM

## 2021-10-01 DIAGNOSIS — E782 Mixed hyperlipidemia: Secondary | ICD-10-CM

## 2021-10-01 DIAGNOSIS — N1831 Chronic kidney disease, stage 3a: Secondary | ICD-10-CM

## 2021-10-01 NOTE — Telephone Encounter (Signed)
Patients wife called in , patient's  blood sugar is 375. She wants to know what to do. She also says he's had dizzyness.       Chief Complaint: Hyperglycemia Symptoms: BS 375, this AM 325, intermittent dizziness since last Thursday Frequency: Today Pertinent Negatives: Patient denies  Disposition: '[]'$ ED /'[]'$ Urgent Care (no appt availability in office) / '[]'$ Appointment(In office/virtual)/ '[]'$  [place] [facility]/ '[]'$ Home Care/ '[]'$ Refused Recommended Disposition /'[]'$ [place] [facility]/ '[x]'$  Follow-up with PCP Additional Notes: Pt states did not have strips to check BS so has not been checked in 1-2 weeks. Checked today for values noted. No associated symptoms, does report intermittent dizziness. No missed doses of po meds, insulin. FSt Pass noted available for tomorrow. Please advise.  Reason for Disposition  [1] Blood glucose > 300 mg/dL (16.7 mmol/L) AND [2] two or more times in a row  Answer Assessment - Initial Assessment Questions 1. BLOOD GLUCOSE: "What is your blood glucose level?"      375 2. ONSET: "When did you check the blood glucose?"     Few minutes ago 3. USUAL RANGE: "What is your glucose level usually?" (e.g., usual fasting morning value, usual evening value)     "Regular" 4. KETONES: "Do you check for ketones (urine or blood test strips)?" If Yes, ask: "What does the test show now?"      no 5. TYPE 1 or 2:  "Do you know what type of diabetes you have?"  (e.g., Type 1, Type 2, Gestational; doesn't know)       6. INSULIN: "Do you take insulin?" "What type of insulin(s) do you use? What is the mode of delivery? (syringe, pen; injection or pump)?"      Yes.  7. DIABETES PILLS: "Do you take any pills for your diabetes?" If Yes, ask: "Have you missed taking any pills recently?"     No missed doses 8. OTHER SYMPTOMS: "Do you have any symptoms?" (e.g., fever, frequent urination, difficulty breathing, dizziness, weakness, vomiting)     Off balance from toe amp. Dizzy last  Thursday.  Protocols used: Diabetes - High Blood Sugar-A-AH

## 2021-10-01 NOTE — Patient Instructions (Signed)
Visit Information  Thank you for taking time to visit with me today. Please don't hesitate to contact me if I can be of assistance to you before our next scheduled telephone appointment.  Following are the goals we discussed today:  CAD  (Status: Goal on Track (progressing): YES.) Long Term Goal     BP Readings from Last 3 Encounters:  09/26/21 110/60  08/30/21 132/89  08/17/21 118/62         Lab Results  Component Value Date    CHOL 163 07/06/2021    HDL 37 (L) 07/06/2021    LDLCALC 89 07/06/2021    TRIG 186 (H) 07/06/2021    CHOLHDL 4.4 07/06/2021    Assessed understanding of CAD diagnosis. 10-01-2021: The patient and his wife are compliant with a plan of care. They see the cardiologist on a consistent basis. Saw cardiologist last week.  Medications reviewed including medications utilized in CAD treatment plan. 10-01-2021: The patient is compliant with medications. Review of medications and th patients wife manages the patients medications. They also work with the pharm D on a regular basis.  Provided education on importance of blood pressure control in management of CAD; Provided education on Importance of limiting foods high in cholesterol. 04-19-2021: Review of dietary intake and patient is compliant with heart healthy/ADA diet. Education on foods to avoid since the patient is having diarrhea off and on. The patients wife is very attentive to the needs of the patient and watches what he eats and does. 06-21-2021: The patients wife states the patients diarrhea is much better now. She says that he is eating good. [name] stable and no new concerns. 10-01-2021: The patient is compliant with dietary restrictions. The patients wife states that he has not had a good appetite and not wanting to eat well. Education on making sure the patient stays hydrated and eats well to keep up energy level.  Counseled on importance of regular laboratory monitoring as prescribed. 10-01-2021: Has blood work on a  regular basis; Reviewed Importance of taking all medications as prescribed. 10-01-2021: Review of medications and patient is compliant with medications. Denies any medication needs or changes in cardiac medications. Is working with the pharm D on a regular basis.  Reviewed Importance of attending all scheduled provider appointments.  Keeps appointments, calls for changes and needs Advised to report any changes in symptoms or exercise tolerance Screening for signs and symptoms of depression related to chronic disease state;  Assessed social determinant of health barriers;    Heart Failure Interventions:  (Status: Goal on Track (progressing): YES.)  Long Term Goal  Basic overview and discussion of pathophysiology of Heart Failure reviewed. 10-01-2021: The patient has his wife have a very good understanding of his HF. Works with the HF clinic and specialist on a regular basis.  Provided education on low sodium diet. 03-21-2021: The patient is compliant with heart healthy/ADA diet. Is not feeling the best today because of some diarrhea but wife states he is eating and drinking well. 07-23-2021: Review of heart healthy/ADA diet with the patients wife. The patient is having a good day today. Denies any fluid overload. 10-01-2021: The patients wife states the patient does not have a good appetite. The patients wife states he is weak. Discussed staying hydrated but making sure not to be fluid overloaded. Education and reading of the cardiologist notes.  Reviewed Heart Failure Action Plan in depth and provided written copy; Assessed need for readable accurate scales in home. 06-21-2021: Participates in  the HF program and weighs daily on a provided mat that send readings into the HF clinic for review and recommendations is any. Per the patients wife he is doing well and has not had an exacerbations in his HF. Reports weight daily. 10-01-2021: Education and review with the patients wife that if he is at 42 or over that they  need to call the provider. Provided education about placing scale on hard, flat surface; Advised patient to weigh each morning after emptying bladder; Discussed importance of daily weight and advised patient to weigh and record daily; Reviewed role of diuretics in prevention of fluid overload and management of heart failure. 10-01-2021: Is compliant with the plan of care for diuretics. Actively participates in HF clinic and reporting weights daily. Review of Dr. Darnelle Bos notes on not taking the torsemide in the afternoon unless he has edema or weight is 195 or above. Used the teach back method to make sure the patients wife understood the instructions by the cardiologist. The patients wife verbalized understanding.  Discussed the importance of keeping all appointments with provider. Has appointments coming up with specialist and calls pcp when needed.  Provided patient with education about the role of exercise in the management of heart failure; Advised patient to discuss torsemide dose  with provider.  01-22-2021: The patients wife states that she has not heard back from the heart failure clinic on the Torsemide dose. Advised the patients wife to call the HF clinic back and request clarification on Torsemide dose. The patients wife verbalized understanding. She states he is not having any issues with swelling but he is having a problem with "holding his water" and its been going on for a while. 01-24-2021: The pharm D is also assisting with getting in touch with the cardiologist/HF clinic to get clarification of medication dosage. 07-23-2021: The patient is taking medications as directed. Denies any issues with medication compliance. 10-01-2021: Education and support given. The patients wife helps the patient with his medications. Review of notes from the cardiologist and helped the wife understand the torsemide dose and how to take per the cardiologist directions.  EF from August 2022 is 30 to 35%   Chronic  Kidney Disease (Status: Goal on Track (progressing): YES.)  Long Term Goal  Last practice recorded BP readings:     BP Readings from Last 3 Encounters:  09/26/21 110/60  08/30/21 132/89  08/17/21 118/62  Most recent eGFR/CrCl:       Lab Results  Component Value Date    EGFR 55 (L) 05/16/2021    No components found for: CRCL   Assessed the patient   and patients wife for   understanding of chronic kidney disease    Evaluation of current treatment plan related to chronic kidney disease self management and patient's adherence to plan as established by provider . 01-22-2021: The patients wife states the patient is having issues with "holding his water". States the urologist gave him a pill but it does not help. The patient is taking myrbetriq but the patients wife says he cannot tell where it helps him. Advised the patients wife to follow up with urologist, will also collaborate with the pcp for any other additional recommendations. Advised the patients wife with the multiple medications he takes and his chronic conditions that this is something that may be a little harder to manage.  10-01-2021: The patient is doing well with management of his CKD. EGFR actually better with most recent labs. The patient is monitored closely.  The patients wife states there are no new concerns at this time with kidney function.   Provided education to patient re: stroke prevention, s/s of heart attack and stroke    Reviewed prescribed diet heart healthy/ADA diet. 10-01-2021: Review of heart healthy/ADA diet  Reviewed medications with patient and discussed importance of compliance.  10-01-2021: The patients wife states compliance with medications. Talked with pharm D on 04-18-2021. The patient works with the CCM team on a regular basis for assistance with effective management of chronic conditions. 10-01-2021: Is compliant with medications. Works closely with the pharm D for medications management. Advised patient, providing  education and rationale, to monitor blood pressure daily and record, calling PCP for findings outside established parameters    Discussed complications of poorly controlled blood pressure such as heart disease, stroke, circulatory complications, vision complications, kidney impairment, sexual dysfunction    Advised patient to discuss urinary incontinence and frequency  with provider    Discussed plans with patient for ongoing care management follow up and provided patient with direct contact information for care management team    Discussed the impact of chronic kidney disease on daily life and mental health and acknowledged and normalized feelings of disempowerment, fear, and frustration    Engage patient in early, proactive and ongoing discussion about goals of care and what matters most to them        COPD: (Status: Goal on Track (progressing): YES.) Long Term Goal  Reviewed medications with patient, including use of prescribed maintenance and rescue inhalers, and provided instruction on medication management and the importance of adherence. 10-01-2021: Is compliant with medications Provided patient with basic written and verbal COPD education on self care/management/and exacerbation prevention. 10-01-2021: The patients wife states the patient is short winded. The patients wife says she has noticed this more. Education on taking rest breaks and monitoring for changes in breathing patterns and reporting to the provider; Advised patient to track and manage COPD triggers. 04-19-2021: The patient and patients wife are aware of triggers that cause COPD exacerbation. The patient has some GI symptoms going on but is not having any respiratory sx and sx of infection. Education and support given. 07-23-2021: The patient and wife state that his COPD is stable at this time. Shortness of breath is no more than usual. The patients wife states he is having a good day today.  10-01-2021: The patients wife feels like the  patient is experiencing more shortness of breath than usual. She is monitoring for changes. Education and support given.  Provided written and verbal instructions on pursed lip breathing and utilized returned demonstration as teach back; Provided instruction about proper use of medications used for management of COPD including inhalers. 10-01-2021: The patient is compliant with medications Advised patient to self assesses COPD action plan zone and make appointment with provider if in the yellow zone for 48 hours without improvement; Advised patient to engage in light exercise as tolerated 3-5 days a week to aid in the the management of COPD; Provided education about and advised patient to utilize infection prevention strategies to reduce risk of respiratory infection. 04-19-2021: Review of staying safe and not being around others who are sick or exhibiting respiratory sx and sx. The patients wife states they only go out when they need to go. He cancelled his dentist appointment yesterday due to the patient needing to go off of his coumadin and the wife states that she is going to reschedule this at a later date. 10-01-2021: Education and  support given. No new concerns related to infection prevention; Discussed the importance of adequate rest and management of fatigue with COPD. 10-01-2021: Review of pacing activity. ;   Diabetes:  (Status: Goal on Track (progressing): YES.) Long Term Goal         Lab Results  Component Value Date    HGBA1C 7.8 (H) 07/16/2021  Assessed patient's understanding of A1c goal: <7%. 10-01-2021: The patients wife knows the goal of A1C education and support provided Provided education to patient about basic DM disease process. 10-01-2021: Education and review ; Reviewed medications with patient and discussed importance of medication adherence. 03-21-2021: The patients wife states that the patient is compliant with his medications. Denies any concerns with medications.  04-17-2021: The  patients wife called the office and ask the staff to have RNCM call her. Call back to the patients wife. She needs assistance with paperwork that she received from [facility]. She could not remember if it was "[facility] [name]" she needed to talk to. The patient states that she does not know what to do with the letter. Education provided and will have the pharm D reach out to the patient. Secure message sent to pharm D for assistance with the needs of the patient. The patients wife is appreciative of the help.   04-19-2021: The patients wife spoke to the pharm D yesterday and was provided the information she needed. The patient is doing well and denies any new concerns at this time.   06-21-2021: The patient was taken off of the Trulicity as it was felt that this medication was contributing to diarrhea. The diarrhea has improved since discontinuation of the medication. The patient had a change in insurance guidelines for glucose meter and the patient reached out to the office and pharm D for assistance. The patient has what he needs for continued glucose monitoring in place now. Pharm D works closely with the patient and his wife to get the needs of the patient met.  10-01-2021: The patient is compliant with medications and works with the [employer] D on a regular basis. The patients wife states they sent the wrong supplies to her for checking the patients blood sugars. She is in contact with the company and they are supposed to call her today. She states they sent her the wrong strips for the meter that the patient has.  Reviewed prescribed diet with patient heart healthy/ADA. 10-01-2021: Review of dietary restrictions and monitoring for foods high in sugars, sodium, and fats.  Counseled on importance of regular laboratory monitoring as prescribed. 10-01-2021: The patient has regular lab work for evaluation of A1C and glucose. Review with the wife the goals of therapy and A1C level from recent lab draws        Discussed  plans with patient for ongoing care management follow up and provided patient with direct contact information for care management team;      Provided patient with written educational materials related to hypo and hyperglycemia and importance of correct treatment. 10-01-2021: Denies any lows at this time. The patients blood sugar last week at the cardiologist was 219. Review of labs from cardiologist ;       Reviewed scheduled/upcoming provider appointments including:  Knows to call for changes ;         Advised patient, providing education and rationale, to check cbg as directed  and record. 03-21-2021: The patients wife states his blood sugars this am were 110. Range for blood sugar readings have been  110 to 130. Review of goal of fasting blood sugars of <130 and post prandial of <180.  06-21-2021: The patients wife states that the patient has been still having some high readings but is overall doing well. The patients wife states he has not checked his blood sugar this am as he just got up. She states they are keeping a close eye on it.    07-23-2021: The patients blood sugar this am was 169. Education on the goal of fasting of <130 and post prandial of <180. 10-01-2021: The patients wife is waiting on the correct supplies. She knows she needs to check the patients blood sugars and she is concerned about this.  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 He has PT/OT and a nurse coming in the home to evaluate and monitor. The patient is walking with his walker and the patients wife denies any falls or safety concerns. The patient has a follow up with the  provider on 04-10-2021. Education on sx and sx of infection and to call the office for changes at surgical site, questions or concerns. 04-19-2021: The patients wife states the area of surgical intervention is healing well and there are no new concerns related to his right foot. Will continue to monitor.  04-17-2021: The patient was seen in the office on 04-13-2021 for evaluation of diarrhea he was having. Follow up today while discussing other needs and the patients wife states his diarrhea is better and denies any acute findings. Will continue to monitor.   09-04-2021: Incoming call from the patients wife. The patient at one time was on Trulicity but could not tolerate it due to diarrhea. The patients wife states that they just delivered from [place] a package of Trulicity and she needed to know what to do with it. Secure chat sent to the pcp, office staff and practice manager asking if they could take the Trulicity from the patient. Instructed the patients wife to take the Trulicity to the office and give to staff. Will ask the pharm D to call and make sure the patient does not get any further automatic deliveries.    Hyperlipidemia:  (Status: Goal on Track (progressing): YES.)      Lab Results  Component Value Date    CHOL 163 07/06/2021    HDL 37 (L) 07/06/2021    LDLCALC 89 07/06/2021    TRIG 186 (H) 07/06/2021    CHOLHDL 4.4 07/06/2021      Medication review performed; medication list updated in electronic medical record. 10-01-2021: The patient is compliant with Crestor  Provider established cholesterol goals reviewed. 10-01-2021: Review of labs and the patient is compliant with regular lab work needs; Counseled on importance of regular laboratory monitoring as prescribed. 10-01-2021: Has labs on a regular basis. Provided HLD educational materials; Reviewed role and benefits of statin for ASCVD risk reduction; Discussed strategies to manage statin-induced myalgias; Reviewed importance of limiting  foods high in cholesterol. 10-01-2021: Review of heart healthy/ADA diet and foods to limit Reviewed exercise goals and target of 150 minutes per week;   Hypertension: (Status: Goal on Track (progressing): YES.) Last practice recorded  BP readings:     BP Readings from Last 3 Encounters:  09/26/21 110/60  08/30/21 132/89  08/17/21 118/62  Most recent eGFR/CrCl:       Lab Results  Component Value Date    EGFR 55 (L) 05/16/2021    No components found for: CRCL   Evaluation of current treatment plan related to hypertension self management and patient's adherence to plan as established by provider. 01-24-2021: The patient was eating breakfast. Reviewed with wife several upcoming appointments and follow ups scheduled.   07-23-2021: The patient is having more normal blood pressure readings. The patient denies any issues with HTN or heart health. Has a lot of chronic conditions impacting his care. Will continue to monitor for changes. 10-01-2021: The patients wife states that the patient has been having more dizziness than usual. The patients wife states his blood pressure was low at the cardiologist office. Education on changing position slowly and the patient possibly having orthostatic hypotension when changing position. The patients wife denies any falls. Education on being safe and reporting any changes in condition Provided education to patient re: stroke prevention, s/s of heart attack and stroke; Reviewed prescribed diet heart healthy/ADA Reviewed medications with patient and discussed importance of compliance. 10-01-2021: The patient is compliant with medications and is monitored closely;  Discussed plans with patient for ongoing care management follow up and provided patient with direct contact information for care management team; Advised patient, providing education and rationale, to monitor blood pressure daily and record, calling PCP for findings outside established parameters;  Advised patient  to discuss blood pressure trends  with provider; Provided education on prescribed diet heart healthy/ADA diet ;  Discussed complications of poorly controlled blood pressure such as heart disease, stroke, circulatory complications, vision complications, kidney impairment, sexual dysfunction;     Our next appointment is by telephone on 11-05-2021 at 0900 am  Please call the care guide team at [phone] if you need to cancel or reschedule your appointment.   If you are experiencing a Mental Health or [place] or need someone to talk to, please call the Suicide and Crisis Lifeline: 988 call the [place] National Suicide Prevention Lifeline: 8153663575 or TTY: (939) 191-7166 TTY 727-101-9532) to talk to a trained counselor call 1-800-273-TALK (toll free, 24 hour hotline)   The patient verbalized understanding of instructions, educational materials, and care plan provided today and DECLINED offer to receive copy of patient instructions, educational materials, and care plan.   Noreene Larsson RN, MSN, [place] Mobile: [phone]

## 2021-10-01 NOTE — Chronic Care Management (AMB) (Signed)
Chronic Care Management   CCM RN Visit Note  10/01/2021 Name: Lucas Caldwell MRN: [id number] DOB: July 10, 1933  Subjective: Lucas Caldwell is a 88 y.o. year old male who is a primary care patient of [name], DO. The care management team was consulted for assistance with disease management and care coordination needs.    Engaged with patient by telephone for follow up visit in response to provider referral for case management and/or care coordination services.   Consent to Services:  The patient was given information about Chronic Care Management services, agreed to services, and gave verbal consent prior to initiation of services.  Please see initial visit note for detailed documentation.   Patient agreed to services and verbal consent obtained.   Assessment: Review of patient past medical history, allergies, medications, health status, including review of consultants reports, laboratory and other test data, was performed as part of comprehensive evaluation and provision of chronic care management services.   SDOH (Social Determinants of Health) assessments and interventions performed:    CCM Care Plan  Allergies  Allergen Reactions   Clopidogrel Anaphylaxis and Other (See Comments)    altered mental status;  (electrolytes "out of wack" and confusion per family; THEY DO NOT REMEMBER ANY OTHER REACTION)- MSB 10/10/15   Ciprofloxacin Itching   Beta Adrenergic Blockers     Nightmares   Mirtazapine Diarrhea    Bad dreams    Spironolactone Other (See Comments)    gynecomastia    Outpatient Encounter Medications as of 10/01/2021  Medication Sig   acetaminophen (TYLENOL) 500 MG tablet Take 1,000 mg by mouth every 6 (six) hours as needed for moderate pain or headache.    Alcohol Swabs (B-D SINGLE USE SWABS REGULAR) PADS Use to check blood sugar up to 2 times daily   allopurinol (ZYLOPRIM) 100 MG tablet Take 1 tablet (100 mg total) by mouth daily.   aspirin 81 MG  chewable tablet Chew 81 mg by mouth daily.   Blood Glucose Calibration (OT ULTRA/FASTTK CNTRL SOLN) SOLN    Blood Glucose Monitoring Suppl (ONE TOUCH ULTRA 2) w/Device KIT SMARTSIG:Via Meter   bosutinib (BOSULIF) 100 MG tablet Take 100 mg by mouth at bedtime. Take with food.   Cholecalciferol 25 MCG (1000 UT) capsule Take 1,000 Units by mouth daily.   famotidine (PEPCID) 40 MG tablet Take 1 tablet (40 mg total) by mouth at bedtime.   gabapentin (NEURONTIN) 600 MG tablet Take 600 mg by mouth 2 (two) times daily.   glucose blood (ONETOUCH VERIO) test strip USE TO TEST BLOOD SUGAR EVERY MORNING AND EVERY NIGHT AT BEDTIME   Insulin Glargine ([name] KWIKPEN) 100 UNIT/ML Inject 20 Units into the skin daily.   Insulin Pen Needle 31G X 5 MM MISC Use to inject insulin daily.   isosorbide mononitrate (IMDUR) 60 MG 24 hr tablet TAKE 1/2 TABLET(30 MG) BY MOUTH DAILY   JARDIANCE 10 MG TABS tablet Take 1 tablet (10 mg total) by mouth daily with breakfast.   Lancets (ONETOUCH DELICA PLUS [id number]) MISC Use to check blood sugar twice daily   levothyroxine (SYNTHROID) 25 MCG tablet TAKE ONE TABLET BY MOUTH EVERY MORNING BEFORE BREAKFAST   losartan (COZAAR) 25 MG tablet TAKE 1/2 TABLET(12.5 MG) BY MOUTH DAILY   Menthol-Zinc Oxide (GOLD BOND EX) Apply 1 application topically daily as needed (muscle pain).   mirabegron ER (MYRBETRIQ) 25 MG TB24 tablet Take 1 tablet (25 mg total) by mouth daily.   Multiple Vitamin (MULTIVITAMIN WITH MINERALS) TABS  tablet Take 1 tablet by mouth daily.   nitroGLYCERIN (NITROSTAT) 0.4 MG SL tablet Place 1 tablet (0.4 mg total) under the tongue every 5 (five) minutes x 3 doses as needed for chest pain.   Polyethyl Glycol-Propyl Glycol (SYSTANE OP) Place 1 drop into both eyes 3 (three) times daily as needed (dry/irritated eyes.).    polyethylene glycol (MIRALAX / GLYCOLAX) 17 g packet Take 17 g by mouth daily as needed for mild constipation.    ranolazine (RANEXA) 500 MG 12 hr tablet  TAKE ONE TABLET BY MOUTH TWICE DAILY   rosuvastatin (CRESTOR) 5 MG tablet Take 1 tablet (5 mg total) by mouth daily.   tamsulosin (FLOMAX) 0.4 MG CAPS capsule Take 1 capsule (0.4 mg total) by mouth daily after supper.   torsemide (DEMADEX) 20 MG tablet Take 1 tablet (20 mg total) by mouth as directed. TAKE 2 tablets (40 mg) in the AM. Hold afternoon dose. Only take additional 1 tablet (20 mg) as needed for weight greater than 195 pounds.   traMADol (ULTRAM) 50 MG tablet Take 1-2 tablets (50-100 mg total) by mouth at bedtime as needed.   warfarin (COUMADIN) 3 MG tablet TAKE 1 TO 1 & 1/2 TABLETS BY MOUTH AS DIRECTED BY THE COUMADIN CLINIC Strength: 3 mg   No facility-administered encounter medications on file as of 10/01/2021.    Patient Active Problem List   Diagnosis Date Noted   Amputated great toe, right ([place]) 03/05/2021   Acute hematogenous osteomyelitis ([place])    Cellulitis of foot    Diabetic foot infection ([place])    Diabetic foot ulcer ([place]) 02/21/2021   Diabetic polyneuropathy associated with type 2 diabetes mellitus ([place]) 11/23/2020   Orthostatic hypotension 10/16/2020   Obstructive sleep apnea 06/29/2020   Urge incontinence of urine 12/22/2019   Trifascicular block 10/20/2019   Wound infection 10/08/2019   Bifascicular block 05/13/2019   Coronary stent patent 04/13/2019   PSVT (paroxysmal supraventricular tachycardia) ([place]) 03/19/2019   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 ([place]) 08/24/2018   CKD (chronic kidney disease), stage IV ([place]) 06/04/2018   Osteoarthritis of knees, bilateral 06/02/2018   Hyperlipidemia associated with type 2 diabetes mellitus ([place]) 05/06/2018   Pain of lower extremity 03/06/2018   Duodenitis 01/30/2018   Hematochezia    Melena    Rectal bleeding 01/11/2018   AVM (arteriovenous malformation) of colon without hemorrhage    Schatzki's ring    Esophageal dysphagia    Diabetic retinopathy ([place]) 07/08/2017   Cardiomyopathy ([place])  06/26/2017   Centrilobular emphysema ([place]) 06/13/2017   Fatigue 12/18/2016   Stable angina ([place]) 09/20/2016   Stage 3a chronic kidney disease ([place]) 09/20/2016   Paroxysmal atrial fibrillation ([place]) 08/28/2016   Chronic anticoagulation 08/28/2016   Essential hypertension 08/20/2016   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 heart with stable angina pectoris ([place]) 08/19/2016   Exertional dyspnea 08/12/2016   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[place]) 12/29/2015   Osteomyelitis of great toe of right foot ([place]) 10/03/2015   Ulcerated, foot, right, with necrosis of bone ([place]) 09/26/2015   Gynecomastia, male 03/31/2014   Hypertrophy of breast 03/31/2014   Nuclear sclerosis of left eye 10/22/2013   Enthesopathy of ankle and tarsus 04/10/2013   Controlled type 2 diabetes mellitus with diabetic nephropathy, with long-term current use of insulin ([place]) 04/09/2013   Diabetic neuropathy associated with type 2 diabetes mellitus ([place]) 04/09/2013   Cataract of left eye 11/25/2011   Corneal opacity 09/30/2011   Pseudophakia of right  eye 09/30/2011   GERD (gastroesophageal reflux disease) 09/25/2011   Gout 09/25/2011   Hyperlipidemia LDL goal <70 09/25/2011   Status post cataract extraction 04/11/2011   Cataract extraction status of eye 04/11/2011   Iron deficiency anemia 04/01/2011   Benign localized hyperplasia of prostate without urinary obstruction and other lower urinary tract symptoms (LUTS) 12/10/2010   Enlarged prostate without lower urinary tract symptoms (luts) 12/10/2010   Colon cancer screening 10/11/2010   Hypothyroidism 10/11/2010   Hyperlipidemia 09/20/2003   Essential and other specified forms of tremor 01/05/2001   Essential tremor 01/05/2001    Conditions to be addressed/monitored:CHF, CAD, HTN, HLD, COPD, and CKD   Care Plan : RNCM: General Plan of Care (Adult) for Chronic Disease Management and Care Cooerdination Needs  Updates made by Vanita Ingles, RN since 10/01/2021 12:00 AM      Problem: RNCM: Development of Plan of Care for Chronic Disease Management (HF, CAD, HTN,HLD, DM, COPD, CKD3, Chronic Pain)   Priority: High     Long-Range Goal: RNCM: Effective Management  of Plan of Care for Chronic Disease Management (HF, CAD, HTN,HLD, DM, COPD, CKD3, Chronic Pain)   Start Date: 01/22/2021  Expected End Date: 01/22/2022  Priority: High  Note:   Current Barriers:  Knowledge Deficits related to plan of care for management of CHF, CAD, HTN, HLD, COPD, DMII, CKD Stage 3, and chronic pain  Care Coordination needs related to Level of care concerns and multiple chronic conditions  Chronic Disease Management support and education needs related to CHF, CAD, HTN, HLD, COPD, DMII, CKD Stage 3, and chronic pain   RNCM Clinical Goal(s):  Patient will verbalize understanding of plan for management of CHF, CAD, HTN, HLD, COPD, DMII, CKD Stage 3, and Osteoarthritis as evidenced by understanding the plan of care, calling the provider for changes, and working with the CCM to optimize health and well being for effective management of chronic conditions take all medications exactly as prescribed and will call provider for medication related questions as evidenced by taking medications as prescribed, contacting providers for questions about medications, and working with the pharm D to manage polypharmacy and medications concerns     attend all scheduled medical appointments:  The patient to see the pcp and specialist on a regular basis as evidenced  by keeping appointments with pcp and other specialist and calling for new appointments needed or questions        demonstrate improved and ongoing adherence to prescribed treatment plan for CHF, CAD, HTN, HLD, COPD, DMII, CKD Stage 3, and Osteoarthritis as evidenced by working with the CCM team to optimize health and well being  demonstrate a decrease in CHF, CAD, HTN, HLD, COPD, DMII, CKD Stage 3, and Osteoarthritis exacerbations  as evidenced by  calling the provider for changes in conditions, stable chronic conditions and working with the CCM team to effectively manage health and well being  demonstrate ongoing self health care management ability for effective management of chronic conditions  as evidenced by  working with the CCM team  through collaboration with Consulting civil engineer, provider, and care team.   Interventions: 1:1 collaboration with primary care provider regarding development and update of comprehensive plan of care as evidenced by provider attestation and co-signature Inter-disciplinary care team collaboration (see longitudinal plan of care) Evaluation of current treatment plan related to  self management and patient's adherence to plan as established by provider   SDOH Barriers (Status: Goal on Track (progressing): YES.) Long Term Goal  Patient interviewed and SDOH assessment performed        Patient interviewed and appropriate assessments performed Provided patient with information about resources in [facility] and care guides available for assistance with changes in SDOH, questions or concerns Discussed plans with patient for ongoing care management follow up and provided patient with direct contact information for care management team Advised patient to call the office for changes in SDOH, new concerns or questions  Provided education to patient/caregiver regarding level of care options. Care guide referral for expressed need of someone to install rails or a sliding door with rails to the bathroom. Instructed the patients wife that the care guide team would reach out to her concerning resources in [place]. (04-19-2021)    CAD  (Status: Goal on Track (progressing): YES.) Long Term Goal  BP Readings from Last 3 Encounters:  09/26/21 110/60  08/30/21 132/89  08/17/21 118/62    Lab Results  Component Value Date   CHOL 163 07/06/2021   HDL 37 (L) 07/06/2021   LDLCALC 89 07/06/2021   TRIG 186 (H)  07/06/2021   CHOLHDL 4.4 07/06/2021    Assessed understanding of CAD diagnosis. 10-01-2021: The patient and his wife are compliant with a plan of care. They see the cardiologist on a consistent basis. Saw cardiologist last week.  Medications reviewed including medications utilized in CAD treatment plan. 10-01-2021: The patient is compliant with medications. Review of medications and th patients wife manages the patients medications. They also work with the pharm D on a regular basis.  Provided education on importance of blood pressure control in management of CAD; Provided education on Importance of limiting foods high in cholesterol. 04-19-2021: Review of dietary intake and patient is compliant with heart healthy/ADA diet. Education on foods to avoid since the patient is having diarrhea off and on. The patients wife is very attentive to the needs of the patient and watches what he eats and does. 06-21-2021: The patients wife states the patients diarrhea is much better now. She says that he is eating good. [name] stable and no new concerns. 10-01-2021: The patient is compliant with dietary restrictions. The patients wife states that he has not had a good appetite and not wanting to eat well. Education on making sure the patient stays hydrated and eats well to keep up energy level.  Counseled on importance of regular laboratory monitoring as prescribed. 10-01-2021: Has blood work on a regular basis; Reviewed Importance of taking all medications as prescribed. 10-01-2021: Review of medications and patient is compliant with medications. Denies any medication needs or changes in cardiac medications. Is working with the pharm D on a regular basis.  Reviewed Importance of attending all scheduled provider appointments.  Keeps appointments, calls for changes and needs Advised to report any changes in symptoms or exercise tolerance Screening for signs and symptoms of depression related to chronic disease state;  Assessed  social determinant of health barriers;   Heart Failure Interventions:  (Status: Goal on Track (progressing): YES.)  Long Term Goal  Basic overview and discussion of pathophysiology of Heart Failure reviewed. 10-01-2021: The patient has his wife have a very good understanding of his HF. Works with the HF clinic and specialist on a regular basis.  Provided education on low sodium diet. 03-21-2021: The patient is compliant with heart healthy/ADA diet. Is not feeling the best today because of some diarrhea but wife states he is eating and drinking well. 07-23-2021: Review of heart healthy/ADA diet with the patients  wife. The patient is having a good day today. Denies any fluid overload. 10-01-2021: The patients wife states the patient does not have a good appetite. The patients wife states he is weak. Discussed staying hydrated but making sure not to be fluid overloaded. Education and reading of the cardiologist notes.  Reviewed Heart Failure Action Plan in depth and provided written copy; Assessed need for readable accurate scales in home. 06-21-2021: Participates in the HF program and weighs daily on a provided mat that send readings into the HF clinic for review and recommendations is any. Per the patients wife he is doing well and has not had an exacerbations in his HF. Reports weight daily. 10-01-2021: Education and review with the patients wife that if he is at 16 or over that they need to call the provider. Provided education about placing scale on hard, flat surface; Advised patient to weigh each morning after emptying bladder; Discussed importance of daily weight and advised patient to weigh and record daily; Reviewed role of diuretics in prevention of fluid overload and management of heart failure. 10-01-2021: Is compliant with the plan of care for diuretics. Actively participates in HF clinic and reporting weights daily. Review of Dr. Darnelle Bos notes on not taking the torsemide in the afternoon unless he has  edema or weight is 195 or above. Used the teach back method to make sure the patients wife understood the instructions by the cardiologist. The patients wife verbalized understanding.  Discussed the importance of keeping all appointments with provider. Has appointments coming up with specialist and calls pcp when needed.  Provided patient with education about the role of exercise in the management of heart failure; Advised patient to discuss torsemide dose  with provider.  01-22-2021: The patients wife states that she has not heard back from the heart failure clinic on the Torsemide dose. Advised the patients wife to call the HF clinic back and request clarification on Torsemide dose. The patients wife verbalized understanding. She states he is not having any issues with swelling but he is having a problem with "holding his water" and its been going on for a while. 01-24-2021: The pharm D is also assisting with getting in touch with the cardiologist/HF clinic to get clarification of medication dosage. 07-23-2021: The patient is taking medications as directed. Denies any issues with medication compliance. 10-01-2021: Education and support given. The patients wife helps the patient with his medications. Review of notes from the cardiologist and helped the wife understand the torsemide dose and how to take per the cardiologist directions.  EF from August 2022 is 30 to 35%  Chronic Kidney Disease (Status: Goal on Track (progressing): YES.)  Long Term Goal  Last practice recorded BP readings:  BP Readings from Last 3 Encounters:  09/26/21 110/60  08/30/21 132/89  08/17/21 118/62  Most recent eGFR/CrCl:  Lab Results  Component Value Date   EGFR 55 (L) 05/16/2021    No components found for: CRCL  Assessed the patient   and patients wife for   understanding of chronic kidney disease    Evaluation of current treatment plan related to chronic kidney disease self management and patient's adherence to plan as  established by provider . 01-22-2021: The patients wife states the patient is having issues with "holding his water". States the urologist gave him a pill but it does not help. The patient is taking myrbetriq but the patients wife says he cannot tell where it helps him. Advised the patients wife to follow up  with urologist, will also collaborate with the pcp for any other additional recommendations. Advised the patients wife with the multiple medications he takes and his chronic conditions that this is something that may be a little harder to manage.  10-01-2021: The patient is doing well with management of his CKD. EGFR actually better with most recent labs. The patient is monitored closely. The patients wife states there are no new concerns at this time with kidney function.   Provided education to patient re: stroke prevention, s/s of heart attack and stroke    Reviewed prescribed diet heart healthy/ADA diet. 10-01-2021: Review of heart healthy/ADA diet  Reviewed medications with patient and discussed importance of compliance.  10-01-2021: The patients wife states compliance with medications. Talked with pharm D on 04-18-2021. The patient works with the CCM team on a regular basis for assistance with effective management of chronic conditions. 10-01-2021: Is compliant with medications. Works closely with the pharm D for medications management. Advised patient, providing education and rationale, to monitor blood pressure daily and record, calling PCP for findings outside established parameters    Discussed complications of poorly controlled blood pressure such as heart disease, stroke, circulatory complications, vision complications, kidney impairment, sexual dysfunction    Advised patient to discuss urinary incontinence and frequency  with provider    Discussed plans with patient for ongoing care management follow up and provided patient with direct contact information for care management team    Discussed the  impact of chronic kidney disease on daily life and mental health and acknowledged and normalized feelings of disempowerment, fear, and frustration    Engage patient in early, proactive and ongoing discussion about goals of care and what matters most to them      COPD: (Status: Goal on Track (progressing): YES.) Long Term Goal  Reviewed medications with patient, including use of prescribed maintenance and rescue inhalers, and provided instruction on medication management and the importance of adherence. 10-01-2021: Is compliant with medications Provided patient with basic written and verbal COPD education on self care/management/and exacerbation prevention. 10-01-2021: The patients wife states the patient is short winded. The patients wife says she has noticed this more. Education on taking rest breaks and monitoring for changes in breathing patterns and reporting to the provider; Advised patient to track and manage COPD triggers. 04-19-2021: The patient and patients wife are aware of triggers that cause COPD exacerbation. The patient has some GI symptoms going on but is not having any respiratory sx and sx of infection. Education and support given. 07-23-2021: The patient and wife state that his COPD is stable at this time. Shortness of breath is no more than usual. The patients wife states he is having a good day today.  10-01-2021: The patients wife feels like the patient is experiencing more shortness of breath than usual. She is monitoring for changes. Education and support given.  Provided written and verbal instructions on pursed lip breathing and utilized returned demonstration as teach back; Provided instruction about proper use of medications used for management of COPD including inhalers. 10-01-2021: The patient is compliant with medications Advised patient to self assesses COPD action plan zone and make appointment with provider if in the yellow zone for 48 hours without improvement; Advised patient to  engage in light exercise as tolerated 3-5 days a week to aid in the the management of COPD; Provided education about and advised patient to utilize infection prevention strategies to reduce risk of respiratory infection. 04-19-2021: Review of staying safe and  not being around others who are sick or exhibiting respiratory sx and sx. The patients wife states they only go out when they need to go. He cancelled his dentist appointment yesterday due to the patient needing to go off of his coumadin and the wife states that she is going to reschedule this at a later date. 10-01-2021: Education and support given. No new concerns related to infection prevention; Discussed the importance of adequate rest and management of fatigue with COPD. 10-01-2021: Review of pacing activity. ;  Diabetes:  (Status: Goal on Track (progressing): YES.) Long Term Goal   Lab Results  Component Value Date   HGBA1C 7.8 (H) 07/16/2021  Assessed patient's understanding of A1c goal: <7%. 10-01-2021: The patients wife knows the goal of A1C education and support provided Provided education to patient about basic DM disease process. 10-01-2021: Education and review ; Reviewed medications with patient and discussed importance of medication adherence. 03-21-2021: The patients wife states that the patient is compliant with his medications. Denies any concerns with medications.  04-17-2021: The patients wife called the office and ask the staff to have RNCM call her. Call back to the patients wife. She needs assistance with paperwork that she received from [facility]. She could not remember if it was "[employer] [employer] [name]" she needed to talk to. The patient states that she does not know what to do with the letter. Education provided and will have the pharm D reach out to the patient. Secure message sent to pharm D for assistance with the needs of the patient. The patients wife is appreciative of the help.   04-19-2021: The patients wife spoke to the pharm D  yesterday and was provided the information she needed. The patient is doing well and denies any new concerns at this time.   06-21-2021: The patient was taken off of the Trulicity as it was felt that this medication was contributing to diarrhea. The diarrhea has improved since discontinuation of the medication. The patient had a change in insurance guidelines for glucose meter and the patient reached out to the office and pharm D for assistance. The patient has what he needs for continued glucose monitoring in place now. Pharm D works closely with the patient and his wife to get the needs of the patient met.  10-01-2021: The patient is compliant with medications and works with the [place] [facility] on a regular basis. The patients wife states they sent the wrong supplies to her for checking the patients blood sugars. She is in contact with the company and they are supposed to call her today. She states they sent her the wrong strips for the meter that the patient has.  Reviewed prescribed diet with patient heart healthy/ADA. 10-01-2021: Review of dietary restrictions and monitoring for foods high in sugars, sodium, and fats.  Counseled on importance of regular laboratory monitoring as prescribed. 10-01-2021: The patient has regular lab work for evaluation of A1C and glucose. Review with the wife the goals of therapy and A1C level from recent lab draws        Discussed plans with patient for ongoing care management follow up and provided patient with direct contact information for care management team;      Provided patient with written educational materials related to hypo and hyperglycemia and importance of correct treatment. 10-01-2021: Denies any lows at this time. The patients blood sugar last week at the cardiologist was 219. Review of labs from cardiologist ;       Reviewed  scheduled/upcoming provider appointments including:  Knows to call for changes ;         Advised patient, providing education and rationale, to  check cbg as directed  and record. 03-21-2021: The patients wife states his blood sugars this am were 110. Range for blood sugar readings have been 110 to 130. Review of goal of fasting blood sugars of <130 and post prandial of <180.  06-21-2021: The patients wife states that the patient has been still having some high readings but is overall doing well. The patients wife states he has not checked his blood sugar this am as he just got up. She states they are keeping a close eye on it.    07-23-2021: The patients blood sugar this am was 169. Education on the goal of fasting of <130 and post prandial of <180. 10-01-2021: The patients wife is waiting on the correct supplies. She knows she needs to check the patients blood sugars and she is concerned about this.  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 He has PT/OT and a nurse coming in the home to evaluate and monitor. The patient is walking with his walker and the patients wife denies any falls or safety concerns. The patient has a follow up with the provider on 04-10-2021. Education on sx and sx of infection and to call the office for changes at surgical site, questions or concerns. 04-19-2021: The patients wife states the area of surgical intervention is healing well and there are no new concerns related to his right foot. Will continue to monitor.  04-17-2021: The patient was seen in the office on 04-13-2021 for evaluation of diarrhea he was having. Follow up today while discussing other needs and the patients wife states his diarrhea is better and denies any acute findings. Will continue to monitor.    09-04-2021: Incoming call from the patients wife. The patient at one time was on Trulicity but could not tolerate it due to diarrhea. The patients wife states that they just delivered from [place] a package of Trulicity and she needed to know what to do with it. Secure chat sent to the pcp, office staff and practice manager asking if they could take the Trulicity from the patient. Instructed the patients wife to take the Trulicity to the office and give to staff. Will ask the pharm D to call and make sure the patient does not get any further automatic deliveries.   Hyperlipidemia:  (Status: Goal on Track (progressing): YES.) Lab Results  Component Value Date   CHOL 163 07/06/2021   HDL 37 (L) 07/06/2021   LDLCALC 89 07/06/2021   TRIG 186 (H) 07/06/2021   CHOLHDL 4.4 07/06/2021     Medication review performed; medication list updated in electronic medical record. 10-01-2021: The patient is compliant with Crestor  Provider established cholesterol goals reviewed. 10-01-2021: Review of labs and the patient is compliant with regular lab work needs; Counseled on importance of regular laboratory monitoring as prescribed. 10-01-2021: Has labs on a regular basis. Provided HLD educational materials; Reviewed role and benefits of statin for ASCVD risk reduction; Discussed strategies to manage statin-induced myalgias;  Reviewed importance of limiting foods high in cholesterol. 10-01-2021: Review of heart healthy/ADA diet and foods to limit Reviewed exercise goals and target of 150 minutes per week;  Hypertension: (Status: Goal on Track (progressing): YES.) Last practice recorded BP readings:  BP Readings from Last 3 Encounters:  09/26/21 110/60  08/30/21 132/89  08/17/21 118/62  Most recent eGFR/CrCl:  Lab Results  Component Value Date   EGFR 55 (L) 05/16/2021    No components found for: CRCL  Evaluation of current treatment plan related to hypertension self management and patient's adherence to plan as  established by provider. 01-24-2021: The patient was eating breakfast. Reviewed with wife several upcoming appointments and follow ups scheduled.   07-23-2021: The patient is having more normal blood pressure readings. The patient denies any issues with HTN or heart health. Has a lot of chronic conditions impacting his care. Will continue to monitor for changes. 10-01-2021: The patients wife states that the patient has been having more dizziness than usual. The patients wife states his blood pressure was low at the cardiologist office. Education on changing position slowly and the patient possibly having orthostatic hypotension when changing position. The patients wife denies any falls. Education on being safe and reporting any changes in condition Provided education to patient re: stroke prevention, s/s of heart attack and stroke; Reviewed prescribed diet heart healthy/ADA Reviewed medications with patient and discussed importance of compliance. 10-01-2021: The patient is compliant with medications and is monitored closely;  Discussed plans with patient for ongoing care management follow up and provided patient with direct contact information for care management team; Advised patient, providing education and rationale, to monitor blood pressure daily and record, calling PCP for findings outside established parameters;  Advised patient to discuss blood pressure trends  with provider; Provided education on prescribed diet heart healthy/ADA diet ;  Discussed complications of poorly controlled blood pressure such as heart disease, stroke, circulatory complications, vision complications, kidney impairment, sexual dysfunction;   Pain:  (Status: Condition stable. Not addressed this visit.) Long Term Goal  Pain assessment performed. 03-24-2020: the patient rates his pain in his bilateral shoulders at an 8 today on a scale of 0-10.  The patient states that he cannot raise his arms but so high. The patient is not taking  any medications for pain relief. Has not tried heat or cold applications. The patient states the injections he gets at the provider office usually help but they have not this time. The patient states that he is open to recommendations. 03-21-2021: The patient denies any pain today. The patient has upcoming imaging to evaluate shoulders. 04-19-2021: Denies pain today. Is having a good day. 06-21-2021: The patient rates his right shoulder pain at a 3 today on a scale of 0-10. Has pain in both shoulders but right is worse that the left. 07-23-2021; Denies pain today Medications reviewed. 01-22-2021: The patient is currently not taking any medications for pain relief. Does have gabapentin that he takes on a consistent basis for neuropathy pain. Dosage in question concerning this as the neurologist has increased dose to 800 mg and renal dose recommends dose not to exceed 600 mg BID. Education with wife concerning medication changes. 07-23-2021: Is compliant with medications regimen.  Reviewed provider established plan for pain management. 01-22-2021: The patient and the patients wife ask if physical therapy would help with shoulder pain and discomfort. Will collaborate with the pcp on recommendations and let the patient and the patients wife know. 01-24-2021: Call made to [facility] on 01-23-2021 to ask about referral from pcp office. The staff states the referral was received and the patient has an appointment With Dr. Eliberto Ivory at 10 am on 02-14-2021.  Attempted to reach the patient or patients wife but was unsuccessful. Returned call today and explained to the wife the orthopedic appointment would address the patients shoulder pain and evaluate and recommend further treatment. Used teach back method for understanding of upcoming appointment with the orthopedic provider. Will continue to monitor. 04-19-2021: The patient is following the recommendations of the provider. The patient is seen on a consistent basis for evaluation and  treatment options. 06-21-2021: The patient had a great benefit from PT on his right shoulder. He and his wife are interested in more time with PT. They are waiting to hear back from the referral now.  Discussed importance of adherence to all scheduled medical appointments. 07-16-2021 with the pcp next appointment Counseled on the importance of reporting any/all new or changed pain symptoms or management strategies to pain management provider; Advised patient to report to care team affect of pain on daily activities; Discussed use of relaxation techniques and/or diversional activities to assist with pain reduction (distraction, imagery, relaxation, massage, acupressure, TENS, heat, and cold application; Reviewed with patient prescribed pharmacological and nonpharmacological pain relief strategies; Advised patient to discuss unresolved pain, changes in level and intensity of pain with provider;   GERD/Acid reflux  (Status: Condition stable. Not addressed this visit.) Long Term Goal  Evaluation of current treatment plan related to GERD,  self-management and patient's adherence to plan as established by provider. 03-21-2021: During outreach today the patients wife states the patient for the last 3 days has had a "sour burp" and not feeling the best with episodes of diarrhea x 3 to 4 times during the night. Denies fever or chills. Is tolerating food and beverages without difficulty. Secure message sent to pcp and CMA for recommendations. Call made back to the wife with instructions from Dr. Parks Ranger. 04-17-2021: The patient denies any issues with GERD and Diarrhea he was seen in the office for on 04-13-2021 has resolved. 04-19-2021: The patients wife states that he is doing good but the diarrhea is coming and going. The patients wife states that she has not gotten the peppermint OTC as recommended by pcp. She will pick some up. She states that she doesn't know why it happens sometimes and not others. Review of  monitoring patterns of what the patient is eating and drinking and see if this is a part of the reason he is having diarrhea. The patients wife states since he is not drinking green tea he is doing better with the diarrhea. Education on foods that can cause more IBS and GERD. The patients wife states she will monitor more closely his dietary intake. States the patient is having a good day today. Will continue to monitor. 06-21-2021: The patients wife states that since the patient has stopped taking the Trulicity that he is not having diarrhea like he was. [name] staying off of the Trulicity and they are monitoring for additional recommendations and treatment options. 07-23-2021: Denies any new concerns with GERD. Will continue to monitor Discussed plans with patient for ongoing care management follow up and provided patient with direct contact information for care management team Advised patient to call the office if symptoms of GERD/ACID reflux do not improve with recommended changes; Provided education to patient re: for the patient at the direction of pcp to increase his omeprazole dose from  20 mg daily to 40 mg daily. Used the teach back method to make sure the patients wife understood the instructions given. The patients wife wrote down instructions and repeated instructions back to [facility]. ; Reviewed medications with patient and discussed 03-21-2021: Advised the patients wife to only use imodium for severe diarrhea. Also advised per instructions from Dr. Parks Ranger that is was okay to take gas-x and pepto bismol as needed. Also to increase Omeprazole dose from 98m daily to 40 mg daily. 04-19-2021: The patients wife endorses compliance with medications. States she has not picked up the peppermint OTC yet as recommended by the pcp but she will to have on hand for diarrhea. Also talked with the pharm D on 04-18-2021 for assistance with medications questions. 07-23-2021: The patient is compliant with  medications; Collaborated with PCP, CMA, and pharm D regarding new complaint of GI symptoms. Recommendations received and the wife called back with instructions. 04-19-2021: The patient is compliant with recommendations by the pcp; Provided patient with instructions from pcp for new onset of GI symptoms x 3 days educational materials related to new onset of GI symptoms. 04-19-2021: Additional follow up and education today on GI symptoms and if flare up occurs; Pharmacy referral for collaboration, ongoing support, and education for medications reconciliation and management ; Discussed plans with patient for ongoing care management follow up and provided patient with direct contact information for care management team; Advised patient to discuss unresolved GI condition with recommended changes, questions, concerns and sx and sx of worsening condition with provider;   Patient Goals/Self-Care Activities: Patient will self administer medications as prescribed as evidenced by self report/primary caregiver report  Patient will attend all scheduled provider appointments as evidenced by clinician review of documented attendance to scheduled appointments and patient/caregiver report Patient will call pharmacy for medication refills as evidenced by patient report and review of pharmacy fill history as appropriate Patient will attend church or other social activities as evidenced by patient report Patient will continue to perform ADL's independently as evidenced by patient/caregiver report Patient will continue to perform IADL's independently as evidenced by patient/caregiver report Patient will call provider office for new concerns or questions as evidenced by review of documented incoming telephone call notes and patient report Patient will work with BSW to address care coordination needs and will continue to work with the clinical team to address health care and disease management related needs as evidenced by  documented adherence to scheduled care management/care coordination appointments - call office if I gain more than 2 pounds in one day or 5 pounds in one week - keep legs up while sitting - track weight in diary - use salt in moderation - watch for swelling in feet, ankles and legs every day - weigh myself daily - develop a rescue plan - follow rescue plan if symptoms flare-up - know when to call the doctor:for changes in HF sx and sx - track symptoms and what helps feel better or worse - dress right for the weather, hot or cold - schedule appointment with eye doctor - check blood sugar at prescribed times: before meals and at bedtime, when you have symptoms of low or high blood sugar, and before and after exercise - check feet daily for cuts, sores or redness - enter blood sugar readings and medication or insulin into daily log - take the blood sugar log to all doctor visits - trim toenails straight across - drink 6 to 8 glasses of water each day - eat  fish at least once per week - fill half of plate with vegetables - limit fast food meals to no more than 1 per week - manage portion size - prepare main meal at home 3 to 5 days each week - read food labels for fat, fiber, carbohydrates and portion size - reduce red meat to 2 to 3 times a week - keep feet up while sitting - wash and dry feet carefully every day - wear comfortable, cotton socks - wear comfortable, well-fitting shoes Keep appointments with the podiatrist, next appointment on 01-24-2021 - avoid second hand smoke - eliminate smoking in my home - identify and remove indoor air pollutants - limit outdoor activity during cold weather - listen for public air quality announcements every day - do breathing exercises every day - develop a rescue plan - eliminate symptom triggers at home - follow rescue plan if symptoms flare-up - use an extra pillow to sleep - develop a new routine to improve sleep - don't eat or  exercise right before bedtime - eat healthy/prescribed diet: heart healthy/ADA diet  - get at least 7 to 8 hours of sleep at night - use devices that will help like a cane, sock-puller or reacher - practice relaxation or meditation daily - do exercises in a comfortable position that makes breathing as easy as possible - check blood pressure 3 times per week - choose a place to take my blood pressure (home, clinic or office, retail store) - write blood pressure results in a log or diary - learn about high blood pressure - keep a blood pressure log - take blood pressure log to all doctor appointments - call doctor for signs and symptoms of high blood pressure - develop an action plan for high blood pressure - keep all doctor appointments - take medications for blood pressure exactly as prescribed - report new symptoms to your doctor - eat more whole grains, fruits and vegetables, lean meats and healthy fats - call for medicine refill 2 or 3 days before it runs out - take all medications exactly as prescribed - call doctor with any symptoms you believe are related to your medicine - call doctor when you experience any new symptoms - go to all doctor appointments as scheduled - adhere to prescribed diet: heart healthy/ADA diet        Plan:Telephone follow up appointment with care management team member scheduled for:  11-05-2021 at 0900 am  Noreene Larsson RN, MSN, [facility] Mobile: [phone]

## 2021-10-02 NOTE — Telephone Encounter (Signed)
Pt.'s wife given PCP's message. States he is checking blood glucose well and eating and drinking well. States she would like appointment because "he seems off balance as well." Appointment made as requested.

## 2021-10-02 NOTE — Telephone Encounter (Signed)
Does he have a way to check glucose more reliably now? We have ordered his test strips a few weeks back for sure, I have signed the orders and I know [name] helped get them ordered.  Dizziness should not necessarily be related to the high blood sugars.  300+ is not usually a main cause of dizziness.  If he is not running low on sugars, that is reassuring.  Dizziness is likely combination of other factors can be from other medications side effect and can be with moving or getting up too quickly.  Goal to maintain nutrition, hydration, caution quick standing/movements.  If persistent dizziness, can follow-up.  [name], [facility] 10/02/2021, 9:32 AM

## 2021-10-05 ENCOUNTER — Ambulatory Visit (INDEPENDENT_AMBULATORY_CARE_PROVIDER_SITE_OTHER): Payer: Medicare HMO | Admitting: Family Medicine

## 2021-10-05 ENCOUNTER — Encounter: Payer: Self-pay | Admitting: Family Medicine

## 2021-10-05 VITALS — BP 131/63 | HR 89 | Ht 64.0 in | Wt 196.0 lb

## 2021-10-05 DIAGNOSIS — Z794 Long term (current) use of insulin: Secondary | ICD-10-CM

## 2021-10-05 DIAGNOSIS — G2581 Restless legs syndrome: Secondary | ICD-10-CM | POA: Diagnosis not present

## 2021-10-05 DIAGNOSIS — E1142 Type 2 diabetes mellitus with diabetic polyneuropathy: Secondary | ICD-10-CM | POA: Diagnosis not present

## 2021-10-05 DIAGNOSIS — R2689 Other abnormalities of gait and mobility: Secondary | ICD-10-CM

## 2021-10-05 DIAGNOSIS — E1121 Type 2 diabetes mellitus with diabetic nephropathy: Secondary | ICD-10-CM | POA: Diagnosis not present

## 2021-10-05 NOTE — Progress Notes (Signed)
Subjective:    Patient ID: Lucas Caldwell, male    DOB: May 29, 1933, 88 y.o.   MRN: [id number]  Lucas Caldwell is a 88 y.o. 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 He has not had any hypoglycemia  Has CBG new with test strips  [name] testing blood sugar twice per day  He admits previously on Gabapentin '600mg'$  in AM and PM. However says also he has another '300mg'$  Gabapentin capsule that he will take PRN at night in addition. [name] followed by Neurology for his neuropathy and imbalance/dizziness.  Has failed lyrica in past due to side effect  Describes muscle jerking generalized and restless legs  Cannot take Requip interaction w warfarin  If wakes up from pain in feet he can take Tylenol, with some relief.   For DM Current medications [name] 20 units daily Jardiance '10mg'$  daily  Off trulicity due to GI side effects diarrhea On med management for DM, improved sugars. Has diabetic shoes On Tramadol PRN Limited benefit from neuropathy.       10/05/2021    3:12 PM 09/03/2021    3:46 PM 04/13/2021   10:57 AM  Depression screen PHQ 2/9  Decreased Interest 0 0 0  Down, Depressed, Hopeless 0 0 0  PHQ - 2 Score 0 0 0  Altered sleeping 0  2  Tired, decreased energy 1  0  Change in appetite 1  0  Feeling bad or failure about yourself  0  0  Trouble concentrating 0  0  Moving slowly or fidgety/restless 0  0  Suicidal thoughts 0  0  PHQ-9 Score 2  2  Difficult doing work/chores Not difficult at all  Not difficult at all    Social History   Tobacco Use   Smoking status: Former    Packs/day: 1.75    Years: 20.00    Total pack years: 35.00    Types: Cigarettes    Start date: 65    Quit date: 1975    Years since quitting: 48.5    Smokeless tobacco: Former  Scientific laboratory technician Use: Never used  Substance Use Topics   Alcohol use: No    Comment: previously drank heavily - quit 1979.   Drug use: No    Review of Systems Per HPI unless specifically indicated above     Objective:    BP 131/63   Pulse 89   Ht '5\' 4"'$  (1.626 m)   Wt 196 lb (88.9 kg)   SpO2 100%   BMI 33.64 kg/m   Wt Readings from Last 3 Encounters:  10/05/21 196 lb (88.9 kg)  09/26/21 193 lb 9.6 oz (87.8 kg)  08/30/21 196 lb (88.9 kg)    Physical Exam Vitals and nursing note reviewed.  Constitutional:      General: [name] not in acute distress.    Appearance: Normal appearance. [name] well-developed. [name] obese. [name] not diaphoretic.     Comments: Well-appearing, comfortable, cooperative  HENT:     Head: Normocephalic and atraumatic.  Eyes:     General:        Right eye: No discharge.        Left eye: No discharge.  Conjunctiva/sclera: Conjunctivae normal.  Cardiovascular:     Rate and Rhythm: Normal rate.  Pulmonary:     Effort: Pulmonary effort is normal.  Skin:    General: Skin is warm and dry.     Findings: No erythema or rash.  Neurological:     Mental Status: [name] alert and oriented to person, place, and time.  Psychiatric:        Mood and Affect: Mood normal.        Behavior: Behavior normal.        Thought Content: Thought content normal.     Comments: Well groomed, good eye contact, normal speech and thoughts     Results for orders placed or performed during the hospital encounter of 85/46/27  Basic Metabolic Panel (BMET)  Result Value Ref Range   Sodium 135 135 - 145 mmol/L   Potassium 3.6 3.5 - 5.1 mmol/L   Chloride 99 98 - 111 mmol/L   CO2 24 22 - 32 mmol/L   Glucose, Bld 219 (H) 70 - 99 mg/dL   BUN 27 (H) 8 - 23 mg/dL   Creatinine, Ser 1.66 (H) 0.61 - 1.24 mg/dL   Calcium 9.0 8.9 - 10.3 mg/dL   GFR, Estimated 40 (L) >60 mL/min   Anion gap 12 5 - 15  CBC  Result Value Ref Range   WBC 6.8 4.0 - 10.5  K/uL   RBC 5.01 4.22 - 5.81 MIL/uL   Hemoglobin 14.2 13.0 - 17.0 g/dL   HCT 43.0 39.0 - 52.0 %   MCV 85.8 80.0 - 100.0 fL   MCH 28.3 26.0 - 34.0 pg   MCHC 33.0 30.0 - 36.0 g/dL   RDW 14.7 11.5 - 15.5 %   Platelets 157 150 - 400 K/uL   nRBC 0.0 0.0 - 0.2 %   *Note: Due to a large number of results and/or encounters for the requested time period, some results have not been displayed. A complete set of results can be found in Results Review.      Assessment & Plan:   Problem List Items Addressed This Visit     Controlled type 2 diabetes mellitus with diabetic nephropathy, with long-term current use of insulin ([place]) - Primary   Diabetic polyneuropathy associated with type 2 diabetes mellitus ([place])   Other Visit Diagnoses     Balance disorder       Restless leg syndrome           Too early for A1c Concern with hyperglycemia Recommend dose increase on the [name] insulin from 20 to 22 units to better control sugars.  If sugar readings consistently > 170, then you can increase again next week to 24 units.  Continue Jardiance '10mg'$  daily.  Take Gabapentin '600mg'$  twice a day. I would AVOID the extra dose of 300 late night if feet wake you up. You can take Tylenol '500mg'$  x 2 = '1000mg'$   Recommend Alpha Lipoic Acid - nerve health supplement for neuropathy / restless leg.  The other Restless Leg medicine can interfere with warfarin so we cannot use it.  [place]'s Restless Legs PM OTC - natural vitamin supplement   No orders of the defined types were placed in this encounter.     Follow up plan: Return in about 6 weeks (around 11/16/2021) for 6 weeks fasting lab only then 1 week later Annual Physical.  Future labs ordered already 11/16/21   [name], [facility] 10/05/2021, 2:56 PM

## 2021-10-05 NOTE — Patient Instructions (Addendum)
Thank you for coming to the office today.  Recommend dose increase on the [name] insulin from 20 to 22 units to better control sugars.  If sugar readings consistently > 170, then you can increase again next week to 24 units.  Continue Jardiance '10mg'$  daily.  Take Gabapentin '600mg'$  twice a day. I would AVOID the extra dose of 300 late night if feet wake you up. You can take Tylenol '500mg'$  x 2 = '1000mg'$   Recommend Alpha Lipoic Acid - nerve health supplement for neuropathy / restless leg.  The other Restless Leg medicine can interfere with warfarin so we cannot use it.  [place]'s Restless Legs PM OTC - natural vitamin supplement   DUE for FASTING BLOOD WORK (no food or drink after midnight before the lab appointment, only water or coffee without cream/sugar on the morning of)  SCHEDULE "Lab Only" visit in the morning at the clinic for lab draw in [place]  - Make sure Lab Only appointment is at about 1 week before your next appointment, so that results will be available  For Lab Results, once available within 2-3 days of blood draw, you can can log in to MyChart online to view your results and a brief explanation. Also, we can discuss results at next follow-up visit.   Please schedule a Follow-up Appointment to: Return in about 6 weeks (around 11/16/2021) for 6 weeks fasting lab only then 1 week later Annual Physical.  If you have any other questions or concerns, please feel free to call the office or send a message through [facility]. You may also schedule an earlier appointment if necessary.  Additionally, you may be receiving a survey about your experience at our office within a few days to 1 week by e-mail or mail. We value your feedback.  [name], DO [facility], [facility]  Diabetes Mellitus and Nutrition, Adult When you have diabetes, or diabetes mellitus, it is very important to have healthy eating habits because your blood sugar (glucose) levels are greatly  affected by what you eat and drink. Eating healthy foods in the right amounts, at about the same times every day, can help you: Manage your blood glucose. Lower your risk of heart disease. Improve your blood pressure. Reach or maintain a healthy weight. What can affect my meal plan? Every person with diabetes is different, and each person has different needs for a meal plan. Your health care provider may recommend that you work with a dietitian to make a meal plan that is best for you. Your meal plan may vary depending on factors such as: The calories you need. The medicines you take. Your weight. Your blood glucose, blood pressure, and cholesterol levels. Your activity level. Other health conditions you have, such as heart or kidney disease. How do carbohydrates affect me? Carbohydrates, also called carbs, affect your blood glucose level more than any other type of food. Eating carbs raises the amount of glucose in your blood. It is important to know how many carbs you can safely have in each meal. This is different for every person. Your dietitian can help you calculate how many carbs you should have at each meal and for each snack. How does alcohol affect me? Alcohol can cause a decrease in blood glucose (hypoglycemia), especially if you use insulin or take certain diabetes medicines by mouth. Hypoglycemia can be a life-threatening condition. Symptoms of hypoglycemia, such as sleepiness, dizziness, and confusion, are similar to symptoms of having too much alcohol. Do not  drink alcohol if: Your health care provider tells you not to drink. You are pregnant, may be pregnant, or are planning to become pregnant. If you drink alcohol: Limit how much you have to: 0-1 drink a day for women. 0-2 drinks a day for men. Know how much alcohol is in your drink. In the [place] one drink equals one 12 oz bottle of beer (355 mL), one 5 oz glass of wine (148 mL), or one 1 oz glass of hard liquor (44  mL). Keep yourself hydrated with water, diet soda, or unsweetened iced tea. Keep in mind that regular soda, juice, and other mixers may contain a lot of sugar and must be counted as carbs. What are tips for following this plan?  Reading food labels Start by checking the serving size on the Nutrition Facts label of packaged foods and drinks. The number of calories and the amount of carbs, fats, and other nutrients listed on the label are based on one serving of the item. Many items contain more than one serving per package. Check the total grams (g) of carbs in one serving. Check the number of grams of saturated fats and trans fats in one serving. Choose foods that have a low amount or none of these fats. Check the number of milligrams (mg) of salt (sodium) in one serving. Most people should limit total sodium intake to less than 2,300 mg per day. Always check the nutrition information of foods labeled as "low-fat" or "nonfat." These foods may be higher in added sugar or refined carbs and should be avoided. Talk to your dietitian to identify your daily goals for nutrients listed on the label. Shopping Avoid buying canned, pre-made, or processed foods. These foods tend to be high in fat, sodium, and added sugar. Shop around the outside edge of the grocery store. This is where you will most often find fresh fruits and vegetables, bulk grains, fresh meats, and fresh dairy products. Cooking Use low-heat cooking methods, such as baking, instead of high-heat cooking methods, such as deep frying. Cook using healthy oils, such as olive, canola, or sunflower oil. Avoid cooking with butter, cream, or high-fat meats. Meal planning Eat meals and snacks regularly, preferably at the same times every day. Avoid going long periods of time without eating. Eat foods that are high in fiber, such as fresh fruits, vegetables, beans, and whole grains. Eat 4-6 oz (112-168 g) of lean protein each day, such as lean meat,  chicken, fish, eggs, or tofu. One ounce (oz) (28 g) of lean protein is equal to: 1 oz (28 g) of meat, chicken, or fish. 1 egg.  cup (62 g) of tofu. Eat some foods each day that contain healthy fats, such as avocado, nuts, seeds, and fish. What foods should I eat? Fruits Berries. Apples. Oranges. Peaches. Apricots. Plums. Grapes. Mangoes. Papayas. Pomegranates. Kiwi. Cherries. Vegetables Leafy greens, including lettuce, spinach, kale, chard, collard greens, mustard greens, and cabbage. Beets. Cauliflower. Broccoli. Carrots. Green beans. Tomatoes. Peppers. Onions. Cucumbers. Brussels sprouts. Grains Whole grains, such as whole-wheat or whole-grain bread, crackers, tortillas, cereal, and pasta. Unsweetened oatmeal. Quinoa. Brown or wild rice. Meats and other proteins Seafood. Poultry without skin. Lean cuts of poultry and beef. Tofu. Nuts. Seeds. Dairy Low-fat or fat-free dairy products such as milk, yogurt, and cheese. The items listed above may not be a complete list of foods and beverages you can eat and drink. Contact a dietitian for more information. What foods should I avoid? Fruits Fruits canned with  syrup. Vegetables Canned vegetables. Frozen vegetables with butter or cream sauce. Grains Refined white flour and flour products such as bread, pasta, snack foods, and cereals. Avoid all processed foods. Meats and other proteins Fatty cuts of meat. Poultry with skin. Breaded or fried meats. Processed meat. Avoid saturated fats. Dairy Full-fat yogurt, cheese, or milk. Beverages Sweetened drinks, such as soda or iced tea. The items listed above may not be a complete list of foods and beverages you should avoid. Contact a dietitian for more information. Questions to ask a health care provider Do I need to meet with a certified diabetes care and education specialist? Do I need to meet with a dietitian? What number can I call if I have questions? When are the best times to check my  blood glucose? Where to find more information: American Diabetes Association: diabetes.org Academy of Nutrition and Dietetics: eatright.[employer] of Diabetes and Digestive and Kidney Diseases: [employer]is Association of Diabetes Care & Education Specialists: diabeteseducator.org Summary It is important to have healthy eating habits because your blood sugar (glucose) levels are greatly affected by what you eat and drink. It is important to use alcohol carefully. A healthy meal plan will help you manage your blood glucose and lower your risk of heart disease. Your health care provider may recommend that you work with a dietitian to make a meal plan that is best for you. This information is not intended to replace advice given to you by your health care provider. Make sure you discuss any questions you have with your health care provider. Document Revised: 10/06/2019 Document Reviewed: 10/06/2019 Elsevier Patient Education  [place].

## 2021-10-15 DIAGNOSIS — Z794 Long term (current) use of insulin: Secondary | ICD-10-CM

## 2021-10-15 DIAGNOSIS — I25118 Atherosclerotic heart disease of native coronary artery with other forms of angina pectoris: Secondary | ICD-10-CM

## 2021-10-15 DIAGNOSIS — I10 Essential (primary) hypertension: Secondary | ICD-10-CM

## 2021-10-15 DIAGNOSIS — J432 Centrilobular emphysema: Secondary | ICD-10-CM | POA: Diagnosis not present

## 2021-10-15 DIAGNOSIS — E785 Hyperlipidemia, unspecified: Secondary | ICD-10-CM

## 2021-10-15 DIAGNOSIS — L97509 Non-pressure chronic ulcer of other part of unspecified foot with unspecified severity: Secondary | ICD-10-CM

## 2021-10-15 DIAGNOSIS — E11621 Type 2 diabetes mellitus with foot ulcer: Secondary | ICD-10-CM | POA: Diagnosis not present

## 2021-10-15 DIAGNOSIS — E1121 Type 2 diabetes mellitus with diabetic nephropathy: Secondary | ICD-10-CM | POA: Diagnosis not present

## 2021-10-15 DIAGNOSIS — I5032 Chronic diastolic (congestive) heart failure: Secondary | ICD-10-CM | POA: Diagnosis not present

## 2021-10-15 DIAGNOSIS — E782 Mixed hyperlipidemia: Secondary | ICD-10-CM | POA: Diagnosis not present

## 2021-10-15 DIAGNOSIS — I5023 Acute on chronic systolic (congestive) heart failure: Secondary | ICD-10-CM

## 2021-10-15 DIAGNOSIS — N1831 Chronic kidney disease, stage 3a: Secondary | ICD-10-CM | POA: Diagnosis not present

## 2021-10-15 DIAGNOSIS — E1169 Type 2 diabetes mellitus with other specified complication: Secondary | ICD-10-CM | POA: Diagnosis not present

## 2021-10-23 ENCOUNTER — Telehealth (HOSPITAL_COMMUNITY): Payer: Self-pay | Admitting: Adult Health

## 2021-10-23 MED ORDER — TORSEMIDE 20 MG PO TABS: 40.0000 mg | ORAL_TABLET | Freq: Two times a day (BID) | ORAL | 3 refills | Status: DC

## 2021-10-23 NOTE — Telephone Encounter (Signed)
Pt aware via wife 

## 2021-10-23 NOTE — Addendum Note (Signed)
Addended by: Ileigh Mettler, Sharlot Gowda on: 10/23/2021 05:05 PM   Modules accepted: Orders

## 2021-10-23 NOTE — Telephone Encounter (Signed)
  Cardiomems Remote Monitoring  S/P Cardiomems Implant   PAD Goal: 15 Most recent reading: 24 suggestive of fluid accumulation.   Recommended changes: Increase torsemide to 40 mg twice a day.   I continue to review and analyze the patients PA pressures weekly (and more often as needed) to bring PA pressures within the optimal range.  Please call. Advise to avoid high sodium foods and limit fluid intake to < 2 liters daily.   Livy Ross NP-C  11:49 AM

## 2021-10-24 ENCOUNTER — Encounter: Admit: 2021-10-24 | Discharge: 2021-10-24 | Payer: MEDICARE

## 2021-10-24 ENCOUNTER — Ambulatory Visit: Admit: 2021-10-24 | Discharge: 2021-10-24 | Payer: MEDICARE

## 2021-10-24 ENCOUNTER — Other Ambulatory Visit: Admit: 2021-10-24 | Discharge: 2021-10-24 | Payer: MEDICARE

## 2021-10-24 DIAGNOSIS — C9210 Chronic myeloid leukemia, BCR/ABL-positive, not having achieved remission: Principal | ICD-10-CM

## 2021-10-24 DIAGNOSIS — I5022 Chronic systolic (congestive) heart failure: Principal | ICD-10-CM

## 2021-10-29 DIAGNOSIS — G4733 Obstructive sleep apnea (adult) (pediatric): Secondary | ICD-10-CM | POA: Diagnosis not present

## 2021-10-30 NOTE — Telephone Encounter (Signed)
See result note 10/02/21. The patient has been notified of the result and verbalized understanding.  All questions (if any) were answered.

## 2021-10-31 ENCOUNTER — Ambulatory Visit: Payer: Medicare HMO | Admitting: Pharmacist

## 2021-10-31 DIAGNOSIS — E1121 Type 2 diabetes mellitus with diabetic nephropathy: Secondary | ICD-10-CM

## 2021-10-31 DIAGNOSIS — I10 Essential (primary) hypertension: Secondary | ICD-10-CM

## 2021-10-31 NOTE — Patient Instructions (Signed)
Visit Information  Thank you for taking time to visit with me today. Please don't hesitate to contact me if I can be of assistance to you before our next scheduled telephone appointment.  Following are the goals we discussed today:   Goals Addressed             This Visit's Progress    Pharmacy Goals       Please check your blood sugar, keep a log of the results and bring this with you to your medical appointments.  Please check your home blood pressure, keep a log of the results and bring this with you to your medical appointments.  Our goal bad cholesterol, or LDL, is less than 70 . This is why it is important to continue taking your rosuvastatin.  Feel free to call me with any questions or concerns. I look forward to our next call!   [name], PharmD, [place], CPP Clinical Pharmacist [facility] [phone]          Our next appointment is by telephone on 12/31/2021 at 10 am  Please call the care guide team at [phone] if you need to cancel or reschedule your appointment.    The patient verbalized understanding of instructions, educational materials, and care plan provided today and DECLINED offer to receive copy of patient instructions, educational materials, and care plan.

## 2021-10-31 NOTE — Chronic Care Management (AMB) (Signed)
Chronic Care Management CCM Pharmacy Note  10/31/2021 Name:  Lucas Caldwell MRN:  [id number] DOB:  07-17-1933   Subjective: Lucas Caldwell is an 88 y.o. year old male who is a primary patient of [name], DO.  The CCM team was consulted for assistance with disease management and care coordination needs.    Engaged with patient by telephone for follow up visit for pharmacy case management and/or care coordination services.   Objective:  Medications Reviewed Today     Reviewed by [name], DO (Physician) on 10/05/21 at 1456  Med List Status: <None>   Medication Order Taking? Sig Documenting Provider Last Dose Status Informant  acetaminophen (TYLENOL) 500 MG tablet [id number] Yes Take 1,000 mg by mouth every 6 (six) hours as needed for moderate pain or headache.  [provider] Taking Active Spouse/Significant Other  Alcohol Swabs (B-D SINGLE USE SWABS REGULAR) PADS [id number] Yes Use to check blood sugar up to 2 times daily [name], DO Taking Active Spouse/Significant Other  allopurinol (ZYLOPRIM) 100 MG tablet [id number] Yes Take 1 tablet (100 mg total) by mouth daily. Theora Gianotti, NP Taking Active   aspirin 81 MG chewable tablet [id number] Yes Chew 81 mg by mouth daily. [provider] Taking Active Spouse/Significant Other  Blood Glucose Calibration (OT ULTRA/FASTTK CNTRL SOLN) Bailey Mech [id number] Yes  [provider] Taking Active   Blood Glucose Monitoring Suppl (ONE TOUCH ULTRA 2) w/Device KIT [id number] Yes SMARTSIG:Via Meter [provider] Taking Active   bosutinib (BOSULIF) 100 MG tablet [id number] Yes Take 100 mg by mouth at bedtime. Take with food. [provider] Taking Active Spouse/Significant Other           Med Note Jeanie Cooks Sep 15, 2019  8:38 AM)    Cholecalciferol 25 MCG (1000 UT) capsule [id number] Yes Take 1,000 Units by mouth daily. [provider] Taking Active Spouse/Significant Other  famotidine (PEPCID) 40 MG tablet [id number] Yes Take 1 tablet (40 mg total) by mouth at bedtime. [name], DO Taking Active   gabapentin (NEURONTIN) 600 MG tablet [id number] Yes Take 600 mg by mouth 2 (two) times daily. [provider] Taking Active   glucose blood (ONETOUCH VERIO) test strip [id number] Yes USE TO TEST BLOOD SUGAR EVERY MORNING AND EVERY NIGHT AT BEDTIME [name], DO Taking Active   Insulin Glargine ([name] KWIKPEN) 100 UNIT/ML [id number] Yes Inject 20 Units into the skin daily. [provider] Taking Active   Insulin Pen Needle 31G X 5 MM MISC [id number] Yes Use to inject insulin daily. [name], DO Taking Active   isosorbide mononitrate (IMDUR) 60 MG 24 hr tablet [id number] Yes TAKE 1/2 TABLET(30 MG) BY MOUTH DAILY Theora Gianotti, NP Taking Active   JARDIANCE 10 MG TABS tablet [id number] Yes Take 1 tablet (10 mg total) by mouth daily with breakfast. [name], DO Taking Active   Lancets (ONETOUCH DELICA PLUS [id number]) [place] [id number] Yes Use to check blood sugar twice daily [name], DO Taking Active   levothyroxine (SYNTHROID) 25 MCG tablet [id number] Yes TAKE ONE TABLET BY MOUTH EVERY MORNING BEFORE BREAKFAST Karamalegos, [name], DO Taking Active   losartan (COZAAR) 25 MG tablet [id number] Yes TAKE 1/2 TABLET(12.5 MG) BY MOUTH DAILY End, [name], MD Taking Active   Menthol-Zinc Oxide (GOLD BOND [place]) [id number] Yes Apply 1 application topically daily as needed (muscle pain). [provider] Taking Active Spouse/Significant Other  mirabegron ER (MYRBETRIQ) 25 MG TB24 tablet [id number] Yes Take 1 tablet (25 mg total) by mouth daily. Debroah Loop, PA-C Taking Active   Multiple Vitamin (MULTIVITAMIN WITH MINERALS) TABS tablet [id number] Yes Take 1 tablet by mouth daily. [provider] Taking Active  Spouse/Significant Other  nitroGLYCERIN (NITROSTAT) 0.4 MG SL tablet [id number] Yes Place 1 tablet (0.4 mg total) under the tongue every 5 (five) minutes x 3 doses as needed for chest pain. Theora Gianotti, NP Taking Active Spouse/Significant Other  Polyethyl Glycol-Propyl Glycol (SYSTANE OP) [id number] Yes Place 1 drop into both eyes 3 (three) times daily as needed (dry/irritated eyes.).  [provider] Taking Active Spouse/Significant Other  polyethylene glycol (MIRALAX / GLYCOLAX) 17 g packet [id number] Yes Take 17 g by mouth daily as needed for mild constipation.  [provider] Taking Active Spouse/Significant Other  ranolazine (RANEXA) 500 MG 12 hr tablet [id number] Yes TAKE ONE TABLET BY MOUTH TWICE DAILY End, [name], MD Taking Active   rosuvastatin (CRESTOR) 5 MG tablet [id number] Yes Take 1 tablet (5 mg total) by mouth daily. End, [name], MD Taking Active   tamsulosin [facility]) 0.4 MG CAPS capsule [id number] Yes Take 1 capsule (0.4 mg total) by mouth daily after supper. Rise Mu, PA-C Taking Active Spouse/Significant Other  torsemide (DEMADEX) 20 MG tablet [id number] Yes Take 1 tablet (20 mg total) by mouth as directed. TAKE 2 tablets (40 mg) in the AM. Hold afternoon dose. Only take additional 1 tablet (20 mg) as needed for weight greater than 195 pounds. End, [name], MD Taking Active   traMADol (ULTRAM) 50 MG tablet [id number] Yes Take 1-2 tablets (50-100 mg total) by mouth at bedtime as needed. [name], DO Taking Active Spouse/Significant Other  warfarin (COUMADIN) 3 MG tablet [id number] Yes TAKE 1 TO 1 & 1/2 TABLETS BY MOUTH AS DIRECTED BY THE COUMADIN CLINIC Strength: 3 mg End, [name], MD Taking Active             Pertinent Labs:  Lab Results  Component Value Date   HGBA1C 7.8 (H) 07/16/2021   Lab Results  Component Value Date   CHOL 163 07/06/2021   HDL 37 (L) 07/06/2021   LDLCALC 89 07/06/2021   TRIG 186 (H)  07/06/2021   CHOLHDL 4.4 07/06/2021   Lab Results  Component Value Date   CREATININE 1.66 (H) 09/26/2021   BUN 27 (H) 09/26/2021   NA 135 09/26/2021   K 3.6 09/26/2021   CL 99 09/26/2021   CO2 24 09/26/2021    SDOH:  (Social Determinants of Health) assessments and interventions performed:    [place]  Review of patient past medical history, allergies, medications, health status, including review of consultants reports, laboratory and other test data, was performed as part of comprehensive evaluation and provision of chronic care management services.   Care Plan : PharmD - Medication Management/Assistance  Updates made by Rennis Petty, RPH-CPP since 10/31/2021 12:00 AM     Problem: Disease Progression      Long-Range Goal: Disease Progression Prevented or Minimized   Start Date: 04/14/2020  Expected End Date: 07/13/2020  Recent Progress: On track  Priority: High  Note:   Current Barriers:  Chronic Disease Management support, education, and care coordination needs related to type 2 diabetes, CKD, HFrEF s/p CardioMEMS, hyperlipidemia, atrial fibrillation, hypertension, coronary artery disease, CHF, hypothyroidism, and CML Financial Note patient has Partial Extra Help/LIS Subsidy Patient enrolled in [place] patient assistance program for 2023 calendar year Patient  denied for patient assistance for Jaridance from [facility] for 2022 calendar year Patient previously enrolled in [place] for Jardiance copayment assistance, but eligibility expected to expire on 06/09/2021 as not in use (currently no copayment through Part D plan Limited vision  Pharmacist Clinical Goal(s):  Over the next 90 days, patient will verbalize ability to afford treatment regimen through collaboration with PharmD and provider.   Interventions: 1:1 collaboration with [name], DO regarding development and update of comprehensive plan of care as  evidenced by provider attestation and co-signature Inter-disciplinary care team collaboration (see longitudinal plan of care) Perform chart review Patient seen for Office Visit with [facility] on 8/9 Per telephone encounter from [facility] for Cardiomems Remote Monitoring follow up on 8/8, patient advised to increase torsemide to 40 mg twice a day Office Visit with PCP on 7/21. Provider advised patient: Recommend dose increase on the [name] insulin from 20 to 22 units to better control sugars If sugar readings consistently > 170, then you can increase again next week to 24 units Take Gabapentin 695m twice a day. AVOID the extra dose of 300 late night if feet wake you up. You can take Tylenol 5071mx 2 = 100036moday patient and wife confirm patient taking torsemide to 40 mg twice a day as directed by Cardiology and tolerating well Discuss importance of using CPAP consistently Wife reports she has noticed patient less likely to be dizzy the next day if uses CPAP overnight Confirms no longer taking additional gabapentin, only 600 mg twice daily  Type 2 Diabetes: Current treatment: [name] 22 units daily Jardiance 10 mg once daily in morning Previous therapies tried: Trulicity (stopped due to concern could be contributing to diarrhea) Reports recent fasting blood sugars readings:  Morning Fasting Blood Sugar   10 - August 158  11 - August 165  12 - August 162  13 - August 130  14 - August 161  15 - August 141  16 - August 160   Identify patient had recently been drinking regular soda (Pepsi and Sprite) last month when having elevated blood sugar readings Advise patient to drink water, rather than soda for blood sugar control Patient verbalizes understanding Reports following fluid restrictions as provided by Cardiology team Counsel on importance of having regular well-balanced meals, while controlling carbohydrate portion sizes Denies hypoglycemia Have  counsel on signs of hypoglycemia and how to manage low blood sugar Exercise: walks with walker/cane ~10 minutes most days of the week Have discussed importance of fall prevention/consistently using walker Counsel to continue to monitor home blood sugar, keep a log of results, bring this record to medical appointments and call office sooner if needed for readings outside of established parameters or symptoms Remind patient's wife to follow up with [employer] ([phone] patient in need of further diabetes testing supplies   CHF: Note [facility] heart failure clinic, including remotely with cardioMEMS Current treatment: Torsemide 20 mg - 2 tablets (40 mg) twice daily Jardiance 10 mg daily Losartan 25 mg -  tablet (12.5 mg) daily Encourage patient to continue to weigh daily and keep log and continue to use cardioMEMS monitoring   Patient Goals/Self-Care Activities Over the next 90 days, patient will:  Check blood sugar, blood pressure and fluid status as directed by providers  Take medications, with assistance of wife, as directed Mrs. Tucci manages patient's medications using weekly pillbox as adherence tool Attends  scheduled medical appointments  Follow Up Plan: Telephone follow up appointment with care management team member scheduled for: 12/31/2021 at 10 am       [name], PharmD, [place], [facility] [place] [phone]

## 2021-11-05 ENCOUNTER — Ambulatory Visit: Payer: Self-pay

## 2021-11-05 ENCOUNTER — Telehealth: Payer: Medicare HMO

## 2021-11-05 ENCOUNTER — Telehealth (HOSPITAL_COMMUNITY): Payer: Self-pay | Admitting: Adult Health

## 2021-11-05 ENCOUNTER — Telehealth: Payer: Self-pay | Admitting: Internal Medicine

## 2021-11-05 MED ORDER — TORSEMIDE 20 MG PO TABS: 60.0000 mg | ORAL_TABLET | Freq: Two times a day (BID) | ORAL | 3 refills | Status: DC

## 2021-11-05 NOTE — Patient Outreach (Signed)
Care Coordination   Follow Up Visit Note   11/05/2021 Name: Lucas Caldwell MRN: [id number] DOB: Apr 23, 1933  Lucas Caldwell is a 88 y.o. year old male who sees [name], DO for primary care. I  spoke with [name], wife and DRP for the patient and also the patient  What matters to the patients health and wellness today?  Weight and fluid overload, and blood sugars getting better    Goals Addressed             This Visit's Progress    RNCM: Effecitve management of DM       Care Coordination Interventions: Lab Results  Component Value Date   HGBA1C 7.8 (H) 07/16/2021     10-28-2021 130  10-29-2021 161  10-30-2021 141  10-31-2021 160  11-04-2021 138  Provided education to patient about basic DM disease process Reviewed medications with patient and discussed importance of medication adherence Counseled on importance of regular laboratory monitoring as prescribed Discussed plans with patient for ongoing care management follow up and provided patient with direct contact information for care management team Provided patient with written educational materials related to hypo and hyperglycemia and importance of correct treatment Reviewed scheduled/upcoming provider appointments including: 12-05-2021 at 3 pm Advised patient, providing education and rationale, to check cbg as directed and record, calling pcp for findings outside established parameters Referral made to pharmacy team for assistance with currently ongoing support and education with the pharm D Review of patient status, including review of consultants reports, relevant laboratory and other test results, and medications completed Review of pcp offering Alpha Lipoic Acid and [place] Restless legs pm for help with RLS        RNCM: Effective Management of HF       Care Coordination Interventions: BP Readings from Last 3 Encounters:  10/05/21 131/63  09/26/21 110/60  08/30/21 132/89    Basic overview  and discussion of pathophysiology of Heart Failure reviewed Provided education on low sodium diet. Education and support given. The patients wife helps with management of dietary habits.  Reviewed Heart Failure Action Plan in depth and provided written copy Assessed need for readable accurate scales in home. 11-05-2021: Uses a mat that he lays on that registers his weight and it goes into the center and his diuretic is managed by what his readings are. He has been on extra dose of his diuretic because of this. Education and support given.  Provided education about placing scale on hard, flat surface Advised patient to weigh each morning after emptying bladder Discussed importance of daily weight and advised patient to weigh and record daily Reviewed role of diuretics in prevention of fluid overload and management of heart failure; Discussed the importance of keeping all appointments with provider Advised patient to discuss changes in weight, questions, or concerns with provider Screening for signs and symptoms of depression related to chronic disease state  Assessed social determinant of health barriers  Review of dietary restrictions and staying away from sugary substances           SDOH assessments and interventions completed:  Yes  SDOH Interventions Today    Flowsheet Row Most Recent Value  SDOH Interventions   Food Insecurity Interventions Intervention Not Indicated  Financial Strain Interventions Intervention Not Indicated  Housing Interventions Intervention Not Indicated  Physical Activity Interventions Other (Comments)  [gets really short of breath due to chronic conditions]  Transportation Interventions Intervention Not Indicated  Care Coordination Interventions Activated:  Yes  Care Coordination Interventions:  Yes, provided   Follow up plan: Follow up call scheduled for 01-28-2022 at 0900 am    Encounter Outcome:  Pt. Visit Completed   Noreene Larsson RN, MSN,  [place] [facility] Mobile: [phone]

## 2021-11-05 NOTE — Telephone Encounter (Addendum)
Pt aware via wife  Advised increased urination is to be expected with recent diuretic changes. Continue to monitor weight-send cardio mems transmissions-drink less than 2L daily- and limit salt intake to less than 2000 mg daily    Followup scheduled with hf team 9/20 Labs can more than likely be repeated at f/u with Dr End 8/25

## 2021-11-05 NOTE — Telephone Encounter (Signed)
Pt c/o medication issue:  1. Name of Medication: torsemide (DEMADEX) 20 MG tablet  2. How are you currently taking this medication (dosage and times per day)? Take 2 tablets (40 mg total) by mouth 2 (two) times daily.  3. Are you having a reaction (difficulty breathing--STAT)? no  4. What is your medication issue? Calling wife calling to say the patient was going to the bathroom too much. Seeing if need to be decrease. Please advise

## 2021-11-05 NOTE — Addendum Note (Signed)
Addended by: Kerry Dory on: 11/05/2021 04:15 PM   Modules accepted: Orders

## 2021-11-05 NOTE — Telephone Encounter (Signed)
  Cardiomems Remote Monitoring  S/P Cardiomems Implant   PAD Goal: 15 Most recent reading: 23--Suggestive of fluid accumulation   Recommended changes: Please call and increase torsemide to 60 mg twice a day.  Check BMET in 7 days.   I continue to review and analyze the patients PA pressures weekly (and more often as needed) to bring PA pressures within the optimal range.     Lucas Wohlford NP-C  12:16 PM

## 2021-11-05 NOTE — Telephone Encounter (Signed)
Addressed in other phone note.

## 2021-11-05 NOTE — Patient Instructions (Signed)
Visit Information  Thank you for taking time to visit with me today. Please don't hesitate to contact me if I can be of assistance to you.   Following are the goals we discussed today:   Goals Addressed             This Visit's Progress    RNCM: Effecitve management of DM       Care Coordination Interventions: Lab Results  Component Value Date   HGBA1C 7.8 (H) 07/16/2021     10-28-2021 130  10-29-2021 161  10-30-2021 141  10-31-2021 160  11-04-2021 138  Provided education to patient about basic DM disease process Reviewed medications with patient and discussed importance of medication adherence Counseled on importance of regular laboratory monitoring as prescribed Discussed plans with patient for ongoing care management follow up and provided patient with direct contact information for care management team Provided patient with written educational materials related to hypo and hyperglycemia and importance of correct treatment Reviewed scheduled/upcoming provider appointments including: 12-05-2021 at 3 pm Advised patient, providing education and rationale, to check cbg as directed and record, calling pcp for findings outside established parameters Referral made to pharmacy team for assistance with currently ongoing support and education with the pharm D Review of patient status, including review of consultants reports, relevant laboratory and other test results, and medications completed Review of pcp offering Alpha Lipoic Acid and [place] Restless legs pm for help with RLS        RNCM: Effective Management of HF       Care Coordination Interventions: BP Readings from Last 3 Encounters:  10/05/21 131/63  09/26/21 110/60  08/30/21 132/89    Basic overview and discussion of pathophysiology of Heart Failure reviewed Provided education on low sodium diet. Education and support given. The patients wife helps with management of dietary habits.  Reviewed Heart Failure Action Plan in  depth and provided written copy Assessed need for readable accurate scales in home. 11-05-2021: Uses a mat that he lays on that registers his weight and it goes into the center and his diuretic is managed by what his readings are. He has been on extra dose of his diuretic because of this. Education and support given.  Provided education about placing scale on hard, flat surface Advised patient to weigh each morning after emptying bladder Discussed importance of daily weight and advised patient to weigh and record daily Reviewed role of diuretics in prevention of fluid overload and management of heart failure; Discussed the importance of keeping all appointments with provider Advised patient to discuss changes in weight, questions, or concerns with provider Screening for signs and symptoms of depression related to chronic disease state  Assessed social determinant of health barriers  Review of dietary restrictions and staying away from sugary substances           Our next appointment is by telephone on 01-28-2022 at 09 am  Please call the care guide team at [phone] if you need to cancel or reschedule your appointment.   If you are experiencing a Mental Health or [place] or need someone to talk to, please call the Suicide and Crisis Lifeline: 988 call the [place] National Suicide Prevention Lifeline: (561)559-2426 or TTY: 612-316-5772 TTY (848)880-8347) to talk to a trained counselor call 1-800-273-TALK (toll free, 24 hour hotline)  The patient verbalized understanding of instructions, educational materials, and care plan provided today and DECLINED offer to receive copy of patient instructions, educational materials, and care plan.  Telephone follow up appointment with care management team member scheduled for: 01-28-2022 at 0900 am  [place], MSN, [place] [facility] Mobile: [phone]

## 2021-11-06 ENCOUNTER — Encounter: Payer: Self-pay | Admitting: Internal Medicine

## 2021-11-06 NOTE — Telephone Encounter (Signed)
Error

## 2021-11-07 ENCOUNTER — Ambulatory Visit (INDEPENDENT_AMBULATORY_CARE_PROVIDER_SITE_OTHER): Payer: Medicare HMO

## 2021-11-07 DIAGNOSIS — I739 Peripheral vascular disease, unspecified: Secondary | ICD-10-CM | POA: Diagnosis not present

## 2021-11-07 DIAGNOSIS — Z5181 Encounter for therapeutic drug level monitoring: Secondary | ICD-10-CM | POA: Diagnosis not present

## 2021-11-07 DIAGNOSIS — E1142 Type 2 diabetes mellitus with diabetic polyneuropathy: Secondary | ICD-10-CM | POA: Diagnosis not present

## 2021-11-07 DIAGNOSIS — I480 Paroxysmal atrial fibrillation: Secondary | ICD-10-CM

## 2021-11-07 DIAGNOSIS — L851 Acquired keratosis [keratoderma] palmaris et plantaris: Secondary | ICD-10-CM | POA: Diagnosis not present

## 2021-11-07 DIAGNOSIS — Z7901 Long term (current) use of anticoagulants: Secondary | ICD-10-CM | POA: Diagnosis not present

## 2021-11-07 DIAGNOSIS — B351 Tinea unguium: Secondary | ICD-10-CM | POA: Diagnosis not present

## 2021-11-07 LAB — POCT INR: INR: 2.2 (ref 2.0–3.0)

## 2021-11-07 NOTE — Patient Instructions (Signed)
Continue dosage of warfarin of 1.5 TABLETS EVERY DAY EXCEPT 1 TABLETS ON MONDAYS & FRIDAYS.  Recheck INR in 6 weeks

## 2021-11-08 ENCOUNTER — Encounter (HOSPITAL_COMMUNITY): Payer: Self-pay | Admitting: Adult Health

## 2021-11-08 NOTE — Progress Notes (Signed)
   Goal 15 11/07/21 Reading = 17   Mr Dorer is within range.   Calianne Larue NP-C 9:15 AM

## 2021-11-08 NOTE — Progress Notes (Deleted)
Follow-up Outpatient Visit Date: 11/09/2021  Primary Care Provider: [name], DO [address] [place] [address]  Chief Complaint: ***  HPI:  [name] is a 88 y.o. male with history of coronary artery disease status post prior RCA, circumflex, and LAD stenting, ischemic cardiomyopathy, chronic combined systolic and diastolic congestive heart failure, hypertension, hyperlipidemia, diabetes, paroxysmal atrial fibrillation (on warfarin), stage III-IV chronic kidney disease, CML, iron deficiency anemia, GI bleed due to duodenal ulcers (12/2017), BPH, hypothyroidism, and peripheral neuropathy with unsteady gait, who presents for follow-up of coronary artery disease and chronic HFrEF.  I last saw him in mid July, at which time Mr. Coppolino complained of increased dizziness and frequent urination in the setting of twice daily torsemide use.  He reported stable dyspnea on exertion and appeared relatively euvolemic on exam.  His weight was also down 2-3 pounds from prior visits prompting [place] to hold hold his afternoon torsemide dose unless he notices increased swelling or weight gain.  Earlier this month (10/23/2021), CardioMEMS reading was noted to be 24 (goal 15), prompting the AHF clinic to recommending increasing torsemide back to 40 mg twice daily.  --------------------------------------------------------------------------------------------------  Past Medical History:  Diagnosis Date   Arthritis    "probably in his legs" (08/15/2016)   Blind right eye    BPH (benign prostatic hyperplasia)    Breast asymmetry    Left breast is larger, present for several years.   CAD (coronary artery disease)    a. Cath in the late 90's - reportedly ok;  b. 2014 s/p stenting x 2 @ [facility]; c 08/19/16 Cath/PCI with DES -> RCA, plan to treat LM 70% medically. Seen by surgery and felt to be too high risk for CABG; d. 08/2018 Cath: LM 70 (iFR 0.86), LAD 20ost, 40p, 50/57m D1 70, LCX patent stent, OM1 30, RCA  patent stent, 469mSR, 40d, RPDA 30. PCWP 8. CO/CI 4.0/2.1; e. 03/2019 PCI to LAD (2.5x15 Resolute Onyx DES).   Chronic combined systolic (congestive) and diastolic (congestive) heart failure ([facility]   a. Previously reduced EF-->50% by echo in 2012;  b. 06/2015 Echo: EF 50-55%  c. 07/2016 Echo: EF 45-50%; d. 12/2017 Echo: EF 55%; e. 08/2018 Echo: EF 35-40%; f. 04/2019 Echo: EF 30-35%; g. s/p cardiomems; h. 10/2020 Echo: EF 30-35%, mod-sev glob HK, gr1 DD.   Chronic kidney disease (CKD), stage IV (severe) (HCC)    CML (chronic myelocytic leukemia) (HCC)    GERD (gastroesophageal reflux disease)    GIB (gastrointestinal bleeding)    a. 12/2017 3 unit PRBC GIB in setting of coumadin-->Endo/colon multiple duodenal ulcers and a single bleeding ulcer in the proximal ascending colon status post hemostatic clipping x2.   Gout    Hyperlipemia    Hypertension    Hypothyroidism    Iron deficiency anemia    Ischemic cardiomyopathy    a. Previously reduced EF-->50% by echo in 2012;  b. 06/2015 Echo: EF 50-55%, Gr2 DD;  c. 07/2016 Echo: EF 45-50%; d. 12/2017 Echo: EF 55%, Gr1 DD, mild MR; e. 08/2018 Echo: EF 35-40%; f. 04/2019 Echo: EF 30-35%; g. 10/2020 Echo: EF 30-35%.   Migraine    "in the 1960s" (08/15/2016)   Obstructive sleep apnea    Orthostatic hypotension    a. 20222 -->carvedilol and hydralazine d/c'd.   PAF (paroxysmal atrial fibrillation) (HCC)    a. ? Dx 2014-->s/p DCCV;  b. CHA2DS2VASc = 6--> Coumadin.   Prostate cancer ([facility]   RBBB  Type II diabetes mellitus [facility])    Past Surgical History:  Procedure Laterality Date   AMPUTATION TOE Right 02/23/2021   Procedure: AMPUTATION TOE;  Surgeon: Samara Deist, DPM;  Location: [facility] ORS;  Service: Podiatry;  Laterality: Right;   CATARACT EXTRACTION W/ INTRAOCULAR LENS  IMPLANT, BILATERAL Bilateral    COLONOSCOPY N/A 01/13/2018   Procedure: COLONOSCOPY;  Surgeon: Lin Landsman, MD;  Location: [facility] ENDOSCOPY;  Service: Gastroenterology;  Laterality:  N/A;   COLONOSCOPY WITH PROPOFOL N/A 01/01/2018   Procedure: COLONOSCOPY WITH PROPOFOL;  Surgeon: Lin Landsman, MD;  Location: [facility] ENDOSCOPY;  Service: Gastroenterology;  Laterality: N/A;   CORONARY ANGIOPLASTY WITH STENT PLACEMENT  09/2012   2 stents   CORONARY STENT INTERVENTION N/A 08/19/2016   Procedure: Coronary Stent Intervention;  Surgeon: [name], MD;  Location: [place] CV LAB;  Service: Cardiovascular;  Laterality: N/A;   CORONARY STENT INTERVENTION N/A 04/13/2019   Procedure: CORONARY STENT INTERVENTION;  Surgeon: [name], MD;  Location: [place] CV LAB;  Service: Cardiovascular;  Laterality: N/A;   ESOPHAGEAL MANOMETRY N/A 12/16/2017   Procedure: ESOPHAGEAL MANOMETRY (EM);  Surgeon: Lin Landsman, MD;  Location: [facility] ENDOSCOPY;  Service: Gastroenterology;  Laterality: N/A;   ESOPHAGOGASTRODUODENOSCOPY N/A 12/20/2016   Procedure: ESOPHAGOGASTRODUODENOSCOPY (EGD);  Surgeon: Lin Landsman, MD;  Location: [facility] ENDOSCOPY;  Service: Gastroenterology;  Laterality: N/A;   ESOPHAGOGASTRODUODENOSCOPY N/A 01/13/2018   Procedure: ESOPHAGOGASTRODUODENOSCOPY (EGD);  Surgeon: Lin Landsman, MD;  Location[facility] ENDOSCOPY;  Service: Gastroenterology;  Laterality: N/A;   ESOPHAGOGASTRODUODENOSCOPY (EGD) WITH PROPOFOL N/A 11/03/2017   Procedure: ESOPHAGOGASTRODUODENOSCOPY (EGD) WITH PROPOFOL;  Surgeon: Lin Landsman, MD;  Location: [facility] ENDOSCOPY;  Service: Gastroenterology;  Laterality: N/A;   EYE SURGERY     HERNIA REPAIR     "navel"   LEFT HEART CATH AND CORONARY ANGIOGRAPHY N/A 08/14/2016   Procedure: Left Heart Cath and Coronary Angiography;  Surgeon: [name], MD;  Location: [place] CV LAB;  Service: Cardiovascular;  Laterality: N/A;   PRESSURE SENSOR/CARDIOMEMS N/A 09/23/2019   Procedure: PRESSURE SENSOR/CARDIOMEMS;  Surgeon: Jolaine Artist, MD;  Location: [place] CV LAB;  Service: Cardiovascular;  Laterality: N/A;    RIGHT HEART CATH N/A 09/23/2019   Procedure: RIGHT HEART CATH;  Surgeon: Jolaine Artist, MD;  Location: [place] CV LAB;  Service: Cardiovascular;  Laterality: N/A;   RIGHT/LEFT HEART CATH AND CORONARY ANGIOGRAPHY N/A 09/01/2018   Procedure: RIGHT/LEFT HEART CATH AND CORONARY ANGIOGRAPHY;  Surgeon: [name], MD;  Location: [place] CV LAB;  Service: Cardiovascular;  Laterality: N/A;   RIGHT/LEFT HEART CATH AND CORONARY ANGIOGRAPHY N/A 04/13/2019   Procedure: RIGHT/LEFT HEART CATH AND CORONARY ANGIOGRAPHY;  Surgeon: [name], MD;  Location: [place] CV LAB;  Service: Cardiovascular;  Laterality: N/A;   TOE AMPUTATION Right    "big toe"    No outpatient medications have been marked as taking for the 11/09/21 encounter (Appointment) with Shantera Monts, [name], MD.    Allergies: Clopidogrel, Ciprofloxacin, Beta adrenergic blockers, Mirtazapine, and Spironolactone  Social History   Tobacco Use   Smoking status: Former    Packs/day: 1.75    Years: 20.00    Total pack years: 35.00    Types: Cigarettes    Start date: 32    Quit date: 1975    Years since quitting: 48.6   Smokeless tobacco: Former  Scientific laboratory technician Use: Never used  Substance Use Topics   Alcohol use: No    Comment: previously drank  heavily - quit 1979.   Drug use: No    Family History  Problem Relation Age of Onset   Brain cancer Father    Diabetes Sister    Heart attack Sister     Review of Systems: A 12-system review of systems was performed and was negative except as noted in the HPI.  --------------------------------------------------------------------------------------------------  Physical Exam: There were no vitals taken for this visit.  General:  NAD. Neck: No JVD or HJR. Lungs: Clear to auscultation bilaterally without wheezes or crackles. Heart: Regular rate and rhythm without murmurs, rubs, or gallops. Abdomen: Soft, nontender, nondistended. Extremities: No lower  extremity edema.  EKG:  ***  Lab Results  Component Value Date   WBC 6.8 09/26/2021   HGB 14.2 09/26/2021   HCT 43.0 09/26/2021   MCV 85.8 09/26/2021   PLT 157 09/26/2021    Lab Results  Component Value Date   NA 135 09/26/2021   K 3.6 09/26/2021   CL 99 09/26/2021   CO2 24 09/26/2021   BUN 27 (H) 09/26/2021   CREATININE 1.66 (H) 09/26/2021   GLUCOSE 219 (H) 09/26/2021   ALT 10 02/22/2021    Lab Results  Component Value Date   CHOL 163 07/06/2021   HDL 37 (L) 07/06/2021   LDLCALC 89 07/06/2021   TRIG 186 (H) 07/06/2021   CHOLHDL 4.4 07/06/2021    --------------------------------------------------------------------------------------------------  ASSESSMENT AND PLAN: [name], MD 11/08/2021 7:37 AM

## 2021-11-09 ENCOUNTER — Ambulatory Visit: Payer: Medicare HMO | Admitting: Internal Medicine

## 2021-11-12 NOTE — Progress Notes (Deleted)
Follow-up Outpatient Visit Date: 11/14/2021  Primary Care Provider: [name], DO [address] [place] [address]  Chief Complaint: ***  HPI:  Lucas Caldwell is a 88 y.o. male with history of coronary artery disease status post prior RCA, circumflex, and LAD stenting, ischemic cardiomyopathy, chronic combined systolic and diastolic congestive heart failure, hypertension, hyperlipidemia, diabetes, paroxysmal atrial fibrillation (on warfarin), stage III-IV chronic kidney disease, CML, iron deficiency anemia, GI bleed due to duodenal ulcers (12/2017), BPH, hypothyroidism, and peripheral neuropathy with unsteady gait, who presents for follow-up of coronary artery disease and chronic HFrEF.  I last saw him in mid July, at which time Lucas Caldwell complained of increased dizziness and frequent urination in the setting of twice daily torsemide use.  He reported stable dyspnea on exertion and appeared relatively euvolemic on exam.  His weight was also down 2-3 pounds from prior visits prompting [place] to hold hold his afternoon torsemide dose unless he notices increased swelling or weight gain.  Earlier this month (10/23/2021), CardioMEMS reading was noted to be 24 (goal 15), prompting the AHF clinic to recommending increasing torsemide back to 40 mg twice daily.  --------------------------------------------------------------------------------------------------  Past Medical History:  Diagnosis Date   Arthritis    "probably in his legs" (08/15/2016)   Blind right eye    BPH (benign prostatic hyperplasia)    Breast asymmetry    Left breast is larger, present for several years.   CAD (coronary artery disease)    a. Cath in the late 90's - reportedly ok;  b. 2014 s/p stenting x 2 @ [facility]; c 08/19/16 Cath/PCI with DES -> RCA, plan to treat LM 70% medically. Seen by surgery and felt to be too high risk for CABG; d. 08/2018 Cath: LM 70 (iFR 0.86), LAD 20ost, 40p, 50/64m D1 70, LCX patent stent, OM1 30, RCA  patent stent, 465mSR, 40d, RPDA 30. PCWP 8. CO/CI 4.0/2.1; e. 03/2019 PCI to LAD (2.5x15 Resolute Onyx DES).   Chronic combined systolic (congestive) and diastolic (congestive) heart failure ([facility]   a. Previously reduced EF-->50% by echo in 2012;  b. 06/2015 Echo: EF 50-55%  c. 07/2016 Echo: EF 45-50%; d. 12/2017 Echo: EF 55%; e. 08/2018 Echo: EF 35-40%; f. 04/2019 Echo: EF 30-35%; g. s/p cardiomems; h. 10/2020 Echo: EF 30-35%, mod-sev glob HK, gr1 DD.   Chronic kidney disease (CKD), stage IV (severe) (HCC)    CML (chronic myelocytic leukemia) (HCC)    GERD (gastroesophageal reflux disease)    GIB (gastrointestinal bleeding)    a. 12/2017 3 unit PRBC GIB in setting of coumadin-->Endo/colon multiple duodenal ulcers and a single bleeding ulcer in the proximal ascending colon status post hemostatic clipping x2.   Gout    Hyperlipemia    Hypertension    Hypothyroidism    Iron deficiency anemia    Ischemic cardiomyopathy    a. Previously reduced EF-->50% by echo in 2012;  b. 06/2015 Echo: EF 50-55%, Gr2 DD;  c. 07/2016 Echo: EF 45-50%; d. 12/2017 Echo: EF 55%, Gr1 DD, mild MR; e. 08/2018 Echo: EF 35-40%; f. 04/2019 Echo: EF 30-35%; g. 10/2020 Echo: EF 30-35%.   Migraine    "in the 1960s" (08/15/2016)   Obstructive sleep apnea    Orthostatic hypotension    a. 20222 -->carvedilol and hydralazine d/c'd.   PAF (paroxysmal atrial fibrillation) (HCC)    a. ? Dx 2014-->s/p DCCV;  b. CHA2DS2VASc = 6--> Coumadin.   Prostate cancer ([facility] [place]   RBBB  Type II diabetes mellitus [facility])    Past Surgical History:  Procedure Laterality Date   AMPUTATION TOE Right 02/23/2021   Procedure: AMPUTATION TOE;  Surgeon: Samara Deist, DPM;  Location: [facility] ORS;  Service: Podiatry;  Laterality: Right;   CATARACT EXTRACTION W/ INTRAOCULAR LENS  IMPLANT, BILATERAL Bilateral    COLONOSCOPY N/A 01/13/2018   Procedure: COLONOSCOPY;  Surgeon: Lin Landsman, MD;  Location: [facility] ENDOSCOPY;  Service: Gastroenterology;  Laterality:  N/A;   COLONOSCOPY WITH PROPOFOL N/A 01/01/2018   Procedure: COLONOSCOPY WITH PROPOFOL;  Surgeon: Lin Landsman, MD;  Location: [facility] ENDOSCOPY;  Service: Gastroenterology;  Laterality: N/A;   CORONARY ANGIOPLASTY WITH STENT PLACEMENT  09/2012   2 stents   CORONARY STENT INTERVENTION N/A 08/19/2016   Procedure: Coronary Stent Intervention;  Surgeon: [name], MD;  Location: [place] CV LAB;  Service: Cardiovascular;  Laterality: N/A;   CORONARY STENT INTERVENTION N/A 04/13/2019   Procedure: CORONARY STENT INTERVENTION;  Surgeon: [name], MD;  Location: [place] CV LAB;  Service: Cardiovascular;  Laterality: N/A;   ESOPHAGEAL MANOMETRY N/A 12/16/2017   Procedure: ESOPHAGEAL MANOMETRY (EM);  Surgeon: Lin Landsman, MD;  Location: [facility] ENDOSCOPY;  Service: Gastroenterology;  Laterality: N/A;   ESOPHAGOGASTRODUODENOSCOPY N/A 12/20/2016   Procedure: ESOPHAGOGASTRODUODENOSCOPY (EGD);  Surgeon: Lin Landsman, MD;  Location: [facility] ENDOSCOPY;  Service: Gastroenterology;  Laterality: N/A;   ESOPHAGOGASTRODUODENOSCOPY N/A 01/13/2018   Procedure: ESOPHAGOGASTRODUODENOSCOPY (EGD);  Surgeon: Lin Landsman, MD;  Location: [facility] ENDOSCOPY;  Service: Gastroenterology;  Laterality: N/A;   ESOPHAGOGASTRODUODENOSCOPY (EGD) WITH PROPOFOL N/A 11/03/2017   Procedure: ESOPHAGOGASTRODUODENOSCOPY (EGD) WITH PROPOFOL;  Surgeon: Lin Landsman, MD;  Location: [facility] ENDOSCOPY;  Service: Gastroenterology;  Laterality: N/A;   EYE SURGERY     HERNIA REPAIR     "navel"   LEFT HEART CATH AND CORONARY ANGIOGRAPHY N/A 08/14/2016   Procedure: Left Heart Cath and Coronary Angiography;  Surgeon: [name], MD;  Location: [place] CV LAB;  Service: Cardiovascular;  Laterality: N/A;   PRESSURE SENSOR/CARDIOMEMS N/A 09/23/2019   Procedure: PRESSURE SENSOR/CARDIOMEMS;  Surgeon: Jolaine Artist, MD;  Location: [place] CV LAB;  Service: Cardiovascular;  Laterality: N/A;    RIGHT HEART CATH N/A 09/23/2019   Procedure: RIGHT HEART CATH;  Surgeon: Jolaine Artist, MD;  Location: [place] CV LAB;  Service: Cardiovascular;  Laterality: N/A;   RIGHT/LEFT HEART CATH AND CORONARY ANGIOGRAPHY N/A 09/01/2018   Procedure: RIGHT/LEFT HEART CATH AND CORONARY ANGIOGRAPHY;  Surgeon: [name], MD;  Location: [place] CV LAB;  Service: Cardiovascular;  Laterality: N/A;   RIGHT/LEFT HEART CATH AND CORONARY ANGIOGRAPHY N/A 04/13/2019   Procedure: RIGHT/LEFT HEART CATH AND CORONARY ANGIOGRAPHY;  Surgeon: [name], MD;  Location: [place] CV LAB;  Service: Cardiovascular;  Laterality: N/A;   TOE AMPUTATION Right    "big toe"    No outpatient medications have been marked as taking for the 11/14/21 encounter (Appointment) with Dori Devino, [name], MD.    Allergies: Clopidogrel, Ciprofloxacin, Beta adrenergic blockers, Mirtazapine, and Spironolactone  Social History   Tobacco Use   Smoking status: Former    Packs/day: 1.75    Years: 20.00    Total pack years: 35.00    Types: Cigarettes    Start date: 74    Quit date: 1975    Years since quitting: 48.6   Smokeless tobacco: Former  Scientific laboratory technician Use: Never used  Substance Use Topics   Alcohol use: No    Comment: previously drank  heavily - quit 1979.   Drug use: No    Family History  Problem Relation Age of Onset   Brain cancer Father    Diabetes Sister    Heart attack Sister     Review of Systems: A 12-system review of systems was performed and was negative except as noted in the HPI.  --------------------------------------------------------------------------------------------------  Physical Exam: There were no vitals taken for this visit.  General:  NAD. Neck: No JVD or HJR. Lungs: Clear to auscultation bilaterally without wheezes or crackles. Heart: Regular rate and rhythm without murmurs, rubs, or gallops. Abdomen: Soft, nontender, nondistended. Extremities: No lower  extremity edema.  EKG:  ***  Lab Results  Component Value Date   WBC 6.8 09/26/2021   HGB 14.2 09/26/2021   HCT 43.0 09/26/2021   MCV 85.8 09/26/2021   PLT 157 09/26/2021    Lab Results  Component Value Date   NA 135 09/26/2021   K 3.6 09/26/2021   CL 99 09/26/2021   CO2 24 09/26/2021   BUN 27 (H) 09/26/2021   CREATININE 1.66 (H) 09/26/2021   GLUCOSE 219 (H) 09/26/2021   ALT 10 02/22/2021    Lab Results  Component Value Date   CHOL 163 07/06/2021   HDL 37 (L) 07/06/2021   LDLCALC 89 07/06/2021   TRIG 186 (H) 07/06/2021   CHOLHDL 4.4 07/06/2021    --------------------------------------------------------------------------------------------------  ASSESSMENT AND PLAN: [name], MD 11/12/2021 4:40 PM

## 2021-11-14 ENCOUNTER — Other Ambulatory Visit: Payer: Self-pay

## 2021-11-14 ENCOUNTER — Emergency Department
Admission: EM | Admit: 2021-11-14 | Discharge: 2021-11-14 | Disposition: A | Discharge status: Home / Self Care | Payer: Medicare HMO | Attending: Emergency Medicine | Admitting: Emergency Medicine

## 2021-11-14 ENCOUNTER — Ambulatory Visit: Payer: Medicare HMO | Admitting: Internal Medicine

## 2021-11-14 DIAGNOSIS — Y92410 Unspecified street and highway as the place of occurrence of the external cause: Secondary | ICD-10-CM | POA: Insufficient documentation

## 2021-11-14 DIAGNOSIS — I5042 Chronic combined systolic (congestive) and diastolic (congestive) heart failure: Secondary | ICD-10-CM | POA: Diagnosis not present

## 2021-11-14 DIAGNOSIS — I2510 Atherosclerotic heart disease of native coronary artery without angina pectoris: Secondary | ICD-10-CM | POA: Insufficient documentation

## 2021-11-14 DIAGNOSIS — Z7901 Long term (current) use of anticoagulants: Secondary | ICD-10-CM | POA: Insufficient documentation

## 2021-11-14 DIAGNOSIS — M25512 Pain in left shoulder: Secondary | ICD-10-CM | POA: Diagnosis not present

## 2021-11-14 DIAGNOSIS — R1032 Left lower quadrant pain: Secondary | ICD-10-CM | POA: Diagnosis not present

## 2021-11-14 DIAGNOSIS — Z8546 Personal history of malignant neoplasm of prostate: Secondary | ICD-10-CM | POA: Diagnosis not present

## 2021-11-14 DIAGNOSIS — Z856 Personal history of leukemia: Secondary | ICD-10-CM | POA: Diagnosis not present

## 2021-11-14 DIAGNOSIS — Z743 Need for continuous supervision: Secondary | ICD-10-CM | POA: Diagnosis not present

## 2021-11-14 DIAGNOSIS — E039 Hypothyroidism, unspecified: Secondary | ICD-10-CM | POA: Insufficient documentation

## 2021-11-14 DIAGNOSIS — E1142 Type 2 diabetes mellitus with diabetic polyneuropathy: Secondary | ICD-10-CM | POA: Insufficient documentation

## 2021-11-14 DIAGNOSIS — N184 Chronic kidney disease, stage 4 (severe): Secondary | ICD-10-CM | POA: Diagnosis not present

## 2021-11-14 DIAGNOSIS — R0789 Other chest pain: Secondary | ICD-10-CM | POA: Insufficient documentation

## 2021-11-14 DIAGNOSIS — I130 Hypertensive heart and chronic kidney disease with heart failure and stage 1 through stage 4 chronic kidney disease, or unspecified chronic kidney disease: Secondary | ICD-10-CM | POA: Insufficient documentation

## 2021-11-14 LAB — CBC
HCT: 42.5 % (ref 39.0–52.0)
Hemoglobin: 13.7 g/dL (ref 13.0–17.0)
MCH: 28.4 pg (ref 26.0–34.0)
MCHC: 32.2 g/dL (ref 30.0–36.0)
MCV: 88 fL (ref 80.0–100.0)
Platelets: 187 10*3/uL (ref 150–400)
RBC: 4.83 MIL/uL (ref 4.22–5.81)
RDW: 14.6 % (ref 11.5–15.5)
WBC: 7.3 10*3/uL (ref 4.0–10.5)
nRBC: 0 % (ref 0.0–0.2)

## 2021-11-14 LAB — BASIC METABOLIC PANEL
Anion gap: 9 (ref 5–15)
BUN: 30 mg/dL — ABNORMAL HIGH (ref 8–23)
CO2: 28 mmol/L (ref 22–32)
Calcium: 9.7 mg/dL (ref 8.9–10.3)
Chloride: 101 mmol/L (ref 98–111)
Creatinine, Ser: 1.73 mg/dL — ABNORMAL HIGH (ref 0.61–1.24)
GFR, Estimated: 38 mL/min — ABNORMAL LOW (ref >60)
Glucose, Bld: 161 mg/dL — ABNORMAL HIGH (ref 70–99)
Potassium: 3.7 mmol/L (ref 3.5–5.1)
Sodium: 138 mmol/L (ref 135–145)

## 2021-11-14 LAB — TROPONIN I (HIGH SENSITIVITY): Troponin I (High Sensitivity): 14 ng/L (ref <18)

## 2021-11-14 LAB — APTT: aPTT: 34 seconds (ref 24–36)

## 2021-11-14 NOTE — ED Notes (Signed)
Dc instructions reviewed with pt no questions or concerns at this time will follow up with pcp as needed
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
## 2021-11-15 ENCOUNTER — Ambulatory Visit (INDEPENDENT_AMBULATORY_CARE_PROVIDER_SITE_OTHER): Payer: Medicare HMO | Admitting: Physician Assistant

## 2021-11-15 ENCOUNTER — Telehealth: Payer: Self-pay | Admitting: Internal Medicine

## 2021-11-15 ENCOUNTER — Ambulatory Visit
Admission: RE | Admit: 2021-11-15 | Discharge: 2021-11-15 | Disposition: A | Discharge status: Home / Self Care | Payer: Medicare HMO | Source: Home / Self Care | Attending: Physician Assistant | Admitting: Physician Assistant

## 2021-11-15 ENCOUNTER — Ambulatory Visit
Admission: RE | Admit: 2021-11-15 | Discharge: 2021-11-15 | Disposition: A | Discharge status: Home / Self Care | Payer: Medicare HMO | Source: Ambulatory Visit | Attending: Physician Assistant | Admitting: Physician Assistant

## 2021-11-15 VITALS — BP 158/74 | HR 96 | Ht 64.0 in | Wt 197.4 lb

## 2021-11-15 DIAGNOSIS — M25512 Pain in left shoulder: Secondary | ICD-10-CM
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
## 2021-11-16 NOTE — Telephone Encounter (Signed)
Most recent CardioMEMS readings available to me indicate that his fluid is adequately controlled.  I recommend that he continue his current dose of torsemide and that we try to get him into see me or an APP next week.  [name], MD [facility]
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
## 2021-11-21 ENCOUNTER — Encounter: Payer: Self-pay | Admitting: Internal Medicine

## 2021-11-21 ENCOUNTER — Ambulatory Visit: Payer: Medicare HMO | Attending: Internal Medicine | Admitting: Internal Medicine

## 2021-11-21 VITALS — BP 110/66 | HR 100 | Ht 64.0 in | Wt 193.0 lb

## 2021-11-21 DIAGNOSIS — I480 Paroxysmal atrial fibrillation: Secondary | ICD-10-CM

## 2021-11-21 DIAGNOSIS — N1832 Chronic kidney disease, stage 3b: Secondary | ICD-10-CM | POA: Diagnosis not present

## 2021-11-21 DIAGNOSIS — E1169 Type 2 diabetes mellitus with other specified complication: Secondary | ICD-10-CM

## 2021-11-21 DIAGNOSIS — I25118 Atherosclerotic heart disease of native coronary artery with other forms of angina pectoris: Secondary | ICD-10-CM

## 2021-11-21 DIAGNOSIS — E785 Hyperlipidemia, unspecified: Secondary | ICD-10-CM | POA: Diagnosis not present

## 2021-11-21 DIAGNOSIS — I493 Ventricular premature depolarization: Secondary | ICD-10-CM | POA: Diagnosis not present

## 2021-11-21 DIAGNOSIS — I5022 Chronic systolic (congestive) heart failure: Secondary | ICD-10-CM | POA: Diagnosis not present
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
## 2021-11-21 NOTE — Patient Instructions (Signed)
Medication Instructions:   Your physician recommends that you continue on your current medications as directed. Please refer to the Current Medication list given to you today.  *If you need a refill on your cardiac medications before your next appointment, please call your pharmacy*   Lab Work:  None ordered  Testing/Procedures:  None ordered   Follow-Up: At [facility], you and your health needs are our priority.  As part of our continuing mission to provide you with exceptional heart care, we have created designated Provider Care Teams.  These Care Teams include your primary Cardiologist (physician) and Advanced Practice Providers (APPs -  Physician Assistants and Nurse Practitioners) who all work together to provide you with the care you need, when you need it.  We recommend signing up for the patient portal called "MyChart".  Sign up information is provided on this After Visit Summary.  MyChart is used to connect with patients for Virtual Visits (Telemedicine).  Patients are able to view lab/test results, encounter notes, upcoming appointments, etc.  Non-urgent messages can be sent to your provider as well.   To learn more about what you can do with MyChart, go to [email or web address].    Your next appointment:   3 month(s)  The format for your next appointment:   In Person  Provider:   You may see [name], MD or one of the following Advanced Practice Providers on your designated Care Team:   [name], NP [name], PA-C Cadence Kathlen Mody, PA-C Gerrie Nordmann, NP    Important Information About Sugar

## 2021-11-22 ENCOUNTER — Telehealth (HOSPITAL_COMMUNITY): Payer: Self-pay | Admitting: Adult Health

## 2021-11-22 NOTE — Telephone Encounter (Signed)
Spoke with spouse, gave her instructions of med changes for the next 3 days. She verbalized med changes and understood instructions  Cardiomems Remote Monitoring  S/P Cardiomems Implant   PAD Goal: 15 Most recent reading: 21 suggestive of fluid accumulation   Recommended changes: Please call. Take an extra 20 mg torsemide x 3  day then back to torsemide 60 mg twice a day.   I continue to review and analyze the patients PA pressures weekly (and more often as needed) to bring PA pressures within the optimal range.    Lucas Caldwell M Lucas Scifres NP-C  3:35 PM

## 2021-11-22 NOTE — Telephone Encounter (Signed)
  Cardiomems Remote Monitoring  S/P Cardiomems Implant   PAD Goal: 15 Most recent reading: 21 suggestive of fluid accumulation   Recommended changes: Please call. Take an extra 20 mg torsemide x 3  day then back to torsemide 60 mg twice a day.   I continue to review and analyze the patients PA pressures weekly (and more often as needed) to bring PA pressures within the optimal range.    Lucas Hanssen NP-C  3:12 PM

## 2021-11-23 ENCOUNTER — Encounter: Payer: Self-pay | Admitting: Internal Medicine

## 2021-11-23 DIAGNOSIS — I493 Ventricular premature depolarization: Secondary | ICD-10-CM | POA: Insufficient documentation

## 2021-11-23 DIAGNOSIS — N1832 Chronic kidney disease, stage 3b: Secondary | ICD-10-CM | POA: Insufficient documentation

## 2021-11-28 ENCOUNTER — Other Ambulatory Visit: Payer: Self-pay

## 2021-11-28 ENCOUNTER — Other Ambulatory Visit: Payer: Medicare HMO

## 2021-11-28 DIAGNOSIS — Z794 Long term (current) use of insulin: Secondary | ICD-10-CM

## 2021-11-28 DIAGNOSIS — I5032 Chronic diastolic (congestive) heart failure: Secondary | ICD-10-CM

## 2021-11-28 DIAGNOSIS — I10 Essential (primary) hypertension: Secondary | ICD-10-CM

## 2021-11-28 DIAGNOSIS — E1169 Type 2 diabetes mellitus with other specified complication: Secondary | ICD-10-CM

## 2021-11-28 DIAGNOSIS — I25118 Atherosclerotic heart disease of native coronary artery with other forms of angina pectoris: Secondary | ICD-10-CM

## 2021-11-28 DIAGNOSIS — Z0000 Encounter for general adult medical examination without abnormal findings: Secondary | ICD-10-CM

## 2021-11-28 DIAGNOSIS — R351 Nocturia: Secondary | ICD-10-CM

## 2021-11-29 DIAGNOSIS — G4733 Obstructive sleep apnea (adult) (pediatric): Secondary | ICD-10-CM | POA: Diagnosis not present

## 2021-12-05 ENCOUNTER — Ambulatory Visit (HOSPITAL_COMMUNITY)
Admission: RE | Admit: 2021-12-05 | Discharge: 2021-12-05 | Disposition: A | Discharge status: Home / Self Care | Payer: Medicare HMO | Source: Ambulatory Visit | Attending: Family Medicine | Admitting: Family Medicine

## 2021-12-05 ENCOUNTER — Ambulatory Visit (INDEPENDENT_AMBULATORY_CARE_PROVIDER_SITE_OTHER): Payer: Medicare HMO | Admitting: Family Medicine

## 2021-12-05 ENCOUNTER — Encounter (HOSPITAL_COMMUNITY): Payer: Self-pay

## 2021-12-05 ENCOUNTER — Other Ambulatory Visit (HOSPITAL_COMMUNITY): Payer: Self-pay | Admitting: Cardiology

## 2021-12-05 VITALS — HR 56 | Wt 193.0 lb

## 2021-12-05 VITALS — BP 115/65 | HR 90 | Ht 64.0 in | Wt 194.4 lb

## 2021-12-05 DIAGNOSIS — E1121 Type 2 diabetes mellitus with diabetic nephropathy: Secondary | ICD-10-CM

## 2021-12-05 DIAGNOSIS — I25118 Atherosclerotic heart disease of native coronary artery with other forms of angina pectoris: Secondary | ICD-10-CM

## 2021-12-05 DIAGNOSIS — I5022 Chronic systolic (congestive) heart failure: Secondary | ICD-10-CM

## 2021-12-05 DIAGNOSIS — Z23 Encounter for immunization: Secondary | ICD-10-CM | POA: Diagnosis not present

## 2021-12-05 DIAGNOSIS — E1169 Type 2 diabetes mellitus with other specified complication: Secondary | ICD-10-CM

## 2021-12-05 DIAGNOSIS — I480 Paroxysmal atrial fibrillation: Secondary | ICD-10-CM

## 2021-12-05 DIAGNOSIS — Z794 Long term (current) use of insulin: Secondary | ICD-10-CM

## 2021-12-05 DIAGNOSIS — N1832 Chronic kidney disease, stage 3b: Secondary | ICD-10-CM

## 2021-12-05 DIAGNOSIS — Z0000 Encounter for general adult medical examination without abnormal findings: Secondary | ICD-10-CM | POA: Diagnosis not present

## 2021-12-05 DIAGNOSIS — E785 Hyperlipidemia, unspecified: Secondary | ICD-10-CM

## 2021-12-05 DIAGNOSIS — I5032 Chronic diastolic (congestive) heart failure: Secondary | ICD-10-CM | POA: Diagnosis not present

## 2021-12-05 DIAGNOSIS — R351 Nocturia: Secondary | ICD-10-CM

## 2021-12-05 DIAGNOSIS — J432 Centrilobular emphysema: Secondary | ICD-10-CM

## 2021-12-05 NOTE — Progress Notes (Signed)
Subjective:    Patient ID: Lucas Caldwell, male    DOB: May 14, 1933, 88 y.o.   MRN: [id number]  Lucas Caldwell is a 88 y.o. male presenting on 12/05/2021 for Annual Exam   HPI  CHF HFrEF Seen earlier today for video visit They increased fluid pill for 3 days   CHRONIC DM, Type 2 / CKD-III Nephropathy and Neuropathy Dizziness / Imbalance   Interval updates - Last A1c 7.8 (07/2021)  Due today  He has not had any hypoglycemia   [name] testing blood sugar twice per day   He admits previously on Gabapentin 626m in AM and PM. However says also he has another 3075mGabapentin capsule that he will take PRN at night in addition. [name] followed by Neurology for his neuropathy and imbalance/dizziness.     If wakes up from pain in feet he can take Tylenol, with some relief.   For DM Current medications [name] 22 units daily Jardiance [id number]   Off trulicity due to GI side effects diarrhea On med management for DM, improved sugars. Has diabetic shoes On Tramadol PRN Limited benefit from neuropathy.  Health Maintenance: Flu Shot today  Future RSV Vaccine if interested at pharmacy     11/05/2021    9:24 AM 10/05/2021    3:12 PM 09/03/2021    3:46 PM  Depression screen PHQ 2/9  Decreased Interest 0 0 0  Down, Depressed, Hopeless 0 0 0  PHQ - 2 Score 0 0 0  Altered sleeping  0   Tired, decreased energy  1   Change in appetite  1   Feeling bad or failure about yourself   0   Trouble concentrating  0   Moving slowly or fidgety/restless  0   Suicidal thoughts  0   PHQ-9 Score  2   Difficult doing work/chores  Not difficult at all     Past Medical History:  Diagnosis Date   Arthritis    "probably in his legs" (08/15/2016)   Blind right eye    BPH (benign prostatic hyperplasia)    Breast asymmetry    Left breast is larger, present for several years.   CAD (coronary artery disease)    a. Cath in the late 90's - reportedly ok;  b. 2014 s/p stenting x 2 @ [facility]; c  08/19/16 Cath/PCI with DES -> RCA, plan to treat LM 70% medically. Seen by surgery and felt to be too high risk for CABG; d. 08/2018 Cath: LM 70 (iFR 0.86), LAD 20ost, 40p, 50/6065m1 70, LCX patent stent, OM1 30, RCA patent stent, 91m22m, 40d, RPDA 30. PCWP 8. CO/CI 4.0/2.1; e. 03/2019 PCI to LAD (2.5x15 Resolute Onyx DES).   Chronic combined systolic (congestive) and diastolic (congestive) heart failure (HCC)[place] a. Previously reduced EF-->50% by echo in 2012;  b. 06/2015 Echo: EF 50-55%  c. 07/2016 Echo: EF 45-50%; d. 12/2017 Echo: EF 55%; e. 08/2018 Echo: EF 35-40%; f. 04/2019 Echo: EF 30-35%; g. s/p cardiomems; h. 10/2020 Echo: EF 30-35%, mod-sev glob HK, gr1 DD.   Chronic kidney disease (CKD), stage IV (severe) (HCC)    CML (chronic myelocytic leukemia) (HCC)    GERD (gastroesophageal reflux disease)    GIB (gastrointestinal bleeding)    a. 12/2017 3 unit PRBC GIB in setting of coumadin-->Endo/colon multiple duodenal ulcers and a single bleeding ulcer in the proximal ascending colon status post hemostatic clipping x2.   Gout    Hyperlipemia  Hypertension    Hypothyroidism    Iron deficiency anemia    Ischemic cardiomyopathy    a. Previously reduced EF-->50% by echo in 2012;  b. 06/2015 Echo: EF 50-55%, Gr2 DD;  c. 07/2016 Echo: EF 45-50%; d. 12/2017 Echo: EF 55%, Gr1 DD, mild MR; e. 08/2018 Echo: EF 35-40%; f. 04/2019 Echo: EF 30-35%; g. 10/2020 Echo: EF 30-35%.   Migraine    "in the 1960s" (08/15/2016)   Obstructive sleep apnea    Orthostatic hypotension    a. 20222 -->carvedilol and hydralazine d/c'd.   PAF (paroxysmal atrial fibrillation) (HCC)    a. ? Dx 2014-->s/p DCCV;  b. CHA2DS2VASc = 6--> Coumadin.   Prostate cancer ([place])    RBBB    Type II diabetes mellitus [facility])    Past Surgical History:  Procedure Laterality Date   AMPUTATION TOE Right 02/23/2021   Procedure: AMPUTATION TOE;  Surgeon: Samara Deist, DPM;  Location: [facility] ORS;  Service: Podiatry;  Laterality: Right;   CATARACT  EXTRACTION W/ INTRAOCULAR LENS  IMPLANT, BILATERAL Bilateral    COLONOSCOPY N/A 01/13/2018   Procedure: COLONOSCOPY;  Surgeon: Lin Landsman, MD;  Location[facility] ENDOSCOPY;  Service: Gastroenterology;  Laterality: N/A;   COLONOSCOPY WITH PROPOFOL N/A 01/01/2018   Procedure: COLONOSCOPY WITH PROPOFOL;  Surgeon: Lin Landsman, MD;  Location: [facility] ENDOSCOPY;  Service: Gastroenterology;  Laterality: N/A;   CORONARY ANGIOPLASTY WITH STENT PLACEMENT  09/2012   2 stents   CORONARY STENT INTERVENTION N/A 08/19/2016   Procedure: Coronary Stent Intervention;  Surgeon: [name], MD;  Location: [place] CV LAB;  Service: Cardiovascular;  Laterality: N/A;   CORONARY STENT INTERVENTION N/A 04/13/2019   Procedure: CORONARY STENT INTERVENTION;  Surgeon: [name], MD;  Location: [place] CV LAB;  Service: Cardiovascular;  Laterality: N/A;   ESOPHAGEAL MANOMETRY N/A 12/16/2017   Procedure: ESOPHAGEAL MANOMETRY (EM);  Surgeon: Lin Landsman, MD;  Location: [facility] ENDOSCOPY;  Service: Gastroenterology;  Laterality: N/A;   ESOPHAGOGASTRODUODENOSCOPY N/A 12/20/2016   Procedure: ESOPHAGOGASTRODUODENOSCOPY (EGD);  Surgeon: Lin Landsman, MD;  Location[facility] ENDOSCOPY;  Service: Gastroenterology;  Laterality: N/A;   ESOPHAGOGASTRODUODENOSCOPY N/A 01/13/2018   Procedure: ESOPHAGOGASTRODUODENOSCOPY (EGD);  Surgeon: Lin Landsman, MD;  Location: [facility] ENDOSCOPY;  Service: Gastroenterology;  Laterality: N/A;   ESOPHAGOGASTRODUODENOSCOPY (EGD) WITH PROPOFOL N/A 11/03/2017   Procedure: ESOPHAGOGASTRODUODENOSCOPY (EGD) WITH PROPOFOL;  Surgeon: Lin Landsman, MD;  Location: [facility] ENDOSCOPY;  Service: Gastroenterology;  Laterality: N/A;   EYE SURGERY     HERNIA REPAIR     "[place]"   LEFT HEART CATH AND CORONARY ANGIOGRAPHY N/A 08/14/2016   Procedure: Left Heart Cath and Coronary Angiography;  Surgeon: [name], MD;  Location: [place] CV LAB;  Service:  Cardiovascular;  Laterality: N/A;   PRESSURE SENSOR/CARDIOMEMS N/A 09/23/2019   Procedure: PRESSURE SENSOR/CARDIOMEMS;  Surgeon: Jolaine Artist, MD;  Location: [place] CV LAB;  Service: Cardiovascular;  Laterality: N/A;   RIGHT HEART CATH N/A 09/23/2019   Procedure: RIGHT HEART CATH;  Surgeon: Jolaine Artist, MD;  Location: [place] CV LAB;  Service: Cardiovascular;  Laterality: N/A;   RIGHT/LEFT HEART CATH AND CORONARY ANGIOGRAPHY N/A 09/01/2018   Procedure: RIGHT/LEFT HEART CATH AND CORONARY ANGIOGRAPHY;  Surgeon: [name], MD;  Location: [place] CV LAB;  Service: Cardiovascular;  Laterality: N/A;   RIGHT/LEFT HEART CATH AND CORONARY ANGIOGRAPHY N/A 04/13/2019   Procedure: RIGHT/LEFT HEART CATH AND CORONARY ANGIOGRAPHY;  Surgeon: [name], MD;  Location: [place] CV LAB;  Service: Cardiovascular;  Laterality: N/A;   TOE AMPUTATION Right    "big toe"   Social History   Socioeconomic History   Marital status: Married    Spouse name: Keven Osborn   Number of children: 2   Years of education: Not on file   Highest education level: 7th grade  Occupational History   Occupation: Retired    Comment: Owned his own [employer].  Tobacco Use   Smoking status: Former    Packs/day: 1.75    Years: 20.00    Total pack years: 35.00    Types: Cigarettes    Start date: 5    Quit date: 1975    Years since quitting: 48.7   Smokeless tobacco: Former  Scientific laboratory technician Use: Never used  Substance and Sexual Activity   Alcohol use: No    Comment: previously drank heavily - quit 1979.   Drug use: No   Sexual activity: Never  Other Topics Concern   Not on file  Social History Narrative   Working on transportation truck part time    Social Determinants of Health   Financial Resource Strain: Low Risk  (11/05/2021)   Overall Financial Resource Strain (CARDIA)    Difficulty of Paying Living Expenses: Not hard at all  Food Insecurity: No Food  Insecurity (11/05/2021)   Hunger Vital Sign    Worried About Running Out of Food in the Last Year: Never true    Ran Out of Food in the Last Year: Never true  Transportation Needs: No Transportation Needs (11/05/2021)   PRAPARE - Hydrologist (Medical): No    Lack of Transportation (Non-Medical): No  Physical Activity: Inactive (11/05/2021)   Exercise Vital Sign    Days of Exercise per Week: 0 days    Minutes of Exercise per Session: 0 min  Stress: No Stress Concern Present (11/05/2021)   [place]    Feeling of Stress : Only a little  Social Connections: Socially Integrated (11/05/2021)   Social Connection and Isolation Panel [NHANES]    Frequency of Communication with Friends and Family: More than three times a week    Frequency of Social Gatherings with Friends and Family: More than three times a week    Attends Religious Services: More than 4 times per year    Active Member of [employer] or Organizations: Yes    Attends Music therapist: More than 4 times per year    Marital Status: Married  Human resources officer Violence: Not At Risk (11/05/2021)   Humiliation, Afraid, Rape, and Kick questionnaire    Fear of Current or Ex-Partner: No    Emotionally Abused: No    Physically Abused: No    Sexually Abused: No   Family History  Problem Relation Age of Onset   Brain cancer Father    Diabetes Sister    Heart attack Sister    Current Outpatient Medications on File Prior to Visit  Medication Sig   acetaminophen (TYLENOL) 500 MG tablet Take 1,000 mg by mouth every 6 (six) hours as needed for moderate pain or headache.    Alcohol Swabs (B-D SINGLE USE SWABS REGULAR) PADS Use to check blood sugar up to 2 times daily   allopurinol (ZYLOPRIM) 100 MG tablet Take 1 tablet (100 mg total) by mouth daily.   aspirin 81 MG chewable tablet Chew 81 mg by mouth daily.   Blood Glucose Calibration (OT  ULTRA/FASTTK CNTRL  SOLN) SOLN    Blood Glucose Monitoring Suppl (ONE TOUCH ULTRA 2) w/Device KIT SMARTSIG:Via Meter   bosutinib (BOSULIF) 100 MG tablet Take 100 mg by mouth at bedtime. Take with food.   Cholecalciferol 25 MCG (1000 UT) capsule Take 1,000 Units by mouth daily.   famotidine (PEPCID) 40 MG tablet Take 1 tablet (40 mg total) by mouth at bedtime.   gabapentin (NEURONTIN) 600 MG tablet Take 600 mg by mouth 2 (two) times daily.   glucose blood (ONETOUCH VERIO) test strip USE TO TEST BLOOD SUGAR EVERY MORNING AND EVERY NIGHT AT BEDTIME   Insulin Glargine ([name] KWIKPEN) 100 UNIT/ML Inject 22 Units into the skin daily.   Insulin Pen Needle 31G X 5 MM MISC Use to inject insulin daily.   isosorbide mononitrate (IMDUR) 60 MG 24 hr tablet TAKE 1/2 TABLET(30 MG) BY MOUTH DAILY   JARDIANCE 10 MG TABS tablet Take 1 tablet (10 mg total) by mouth daily with breakfast.   Lancets (ONETOUCH DELICA PLUS [id number]) MISC Use to check blood sugar twice daily   levothyroxine (SYNTHROID) 25 MCG tablet TAKE ONE TABLET BY MOUTH EVERY MORNING BEFORE BREAKFAST   losartan (COZAAR) 25 MG tablet TAKE 1/2 TABLET(12.5 MG) BY MOUTH DAILY   Menthol-Zinc Oxide (GOLD BOND EX) Apply 1 application topically daily as needed (muscle pain).   metoprolol succinate (TOPROL-XL) 25 MG 24 hr tablet Take 1 tablet by mouth daily.   mirabegron ER (MYRBETRIQ) 25 MG TB24 tablet Take 1 tablet (25 mg total) by mouth daily.   Multiple Vitamin (MULTIVITAMIN WITH MINERALS) TABS tablet Take 1 tablet by mouth daily.   nitroGLYCERIN (NITROSTAT) 0.4 MG SL tablet Place 1 tablet (0.4 mg total) under the tongue every 5 (five) minutes x 3 doses as needed for chest pain.   Polyethyl Glycol-Propyl Glycol (SYSTANE OP) Place 1 drop into both eyes 3 (three) times daily as needed (dry/irritated eyes.).    polyethylene glycol (MIRALAX / GLYCOLAX) 17 g packet Take 17 g by mouth daily as needed for mild constipation.    ranolazine (RANEXA) 500 MG 12  hr tablet TAKE ONE TABLET BY MOUTH TWICE DAILY   rosuvastatin (CRESTOR) 5 MG tablet Take 1 tablet (5 mg total) by mouth daily.   tamsulosin (FLOMAX) 0.4 MG CAPS capsule Take 1 capsule (0.4 mg total) by mouth daily after supper.   torsemide (DEMADEX) 20 MG tablet Take 3 tablets (60 mg total) by mouth 2 (two) times daily.   traMADol (ULTRAM) 50 MG tablet Take 1-2 tablets (50-100 mg total) by mouth at bedtime as needed.   warfarin (COUMADIN) 3 MG tablet TAKE 1 TO 1 & 1/2 TABLETS BY MOUTH AS DIRECTED BY THE COUMADIN CLINIC Strength: 3 mg   No current facility-administered medications on file prior to visit.    Review of Systems  Constitutional:  Negative for activity change, appetite change, chills, diaphoresis, fatigue and fever.  HENT:  Negative for congestion and hearing loss.   Eyes:  Negative for visual disturbance.  Respiratory:  Negative for cough, chest tightness, shortness of breath and wheezing.   Cardiovascular:  Negative for chest pain, palpitations and leg swelling.  Gastrointestinal:  Negative for abdominal pain, constipation, diarrhea, nausea and vomiting.  Genitourinary:  Negative for dysuria, frequency and hematuria.  Musculoskeletal:  Positive for arthralgias. Negative for neck pain.  Skin:  Negative for rash.  Neurological:  Positive for dizziness. Negative for weakness, light-headedness, numbness and headaches.  Hematological:  Negative for adenopathy.  Psychiatric/Behavioral:  Negative for behavioral problems,  dysphoric mood and sleep disturbance.    Per HPI unless specifically indicated above      Objective:    BP 115/65   Pulse 90   Ht '5\' 4"'  (1.626 m)   Wt 194 lb 6.4 oz (88.2 kg)   SpO2 98%   BMI 33.37 kg/m   Wt Readings from Last 3 Encounters:  12/05/21 193 lb (87.5 kg)  12/05/21 194 lb 6.4 oz (88.2 kg)  11/21/21 193 lb (87.5 kg)    Physical Exam Vitals and nursing note reviewed.  Constitutional:      General: [name] not in acute distress.     Appearance: [name] well-developed. [name] not diaphoretic.     Comments: Well-appearing, comfortable, cooperative  HENT:     Head: Normocephalic and atraumatic.  Eyes:     General:        Right eye: No discharge.        Left eye: No discharge.     Conjunctiva/sclera: Conjunctivae normal.     Pupils: Pupils are equal, round, and reactive to light.  Neck:     Thyroid: No thyromegaly.  Cardiovascular:     Rate and Rhythm: Normal rate and regular rhythm.     Pulses: Normal pulses.     Heart sounds: Normal heart sounds. No murmur heard. Pulmonary:     Effort: Pulmonary effort is normal. No respiratory distress.     Breath sounds: Normal breath sounds. No wheezing or rales.  Abdominal:     General: Bowel sounds are normal. There is no distension.     Palpations: Abdomen is soft. There is no mass.     Tenderness: There is no abdominal tenderness.  Musculoskeletal:        General: No tenderness. Normal range of motion.     Cervical back: Normal range of motion and neck supple.     Comments: Upper / Lower Extremities: - Normal muscle tone, strength bilateral upper extremities 5/5, lower extremities 5/5  Lymphadenopathy:     Cervical: No cervical adenopathy.  Skin:    General: Skin is warm and dry.     Findings: No erythema or rash.  Neurological:     Mental Status: [name] alert and oriented to person, place, and time.     Comments: Distal sensation intact to light touch all extremities  Psychiatric:        Mood and Affect: Mood normal.        Behavior: Behavior normal.        Thought Content: Thought content normal.     Comments: Well groomed, good eye contact, normal speech and thoughts     Diabetic Foot Exam - Simple   Simple Foot Form Diabetic Foot exam was performed with the following findings: Yes 12/05/2021  3:26 PM  Visual Inspection See comments: Yes Sensation Testing See comments: Yes Pulse Check Posterior Tibialis and Dorsalis pulse intact bilaterally:  Yes Comments Right foot partial great toe amputation well healed. Some callus formation. Dry skin. Reduced sensation to monofilament. No ulceration      Results for orders placed or performed in visit on 12/05/21  COMPLETE METABOLIC PANEL WITH GFR  Result Value Ref Range   Glucose, Bld 155 (H) 65 - 99 mg/dL   BUN 31 (H) 7 - 25 mg/dL   Creat 1.96 (H) 0.70 - 1.22 mg/dL   eGFR 33 (L) > OR = 60 mL/min/1.53m   BUN/Creatinine Ratio 16 6 - 22 (calc)   Sodium 138 135 -  146 mmol/L   Potassium 4.0 3.5 - 5.3 mmol/L   Chloride 100 98 - 110 mmol/L   CO2 27 20 - 32 mmol/L   Calcium 9.8 8.6 - 10.3 mg/dL   Total Protein 8.0 6.1 - 8.1 g/dL   Albumin 4.3 3.6 - 5.1 g/dL   Globulin 3.7 1.9 - 3.7 g/dL (calc)   AG Ratio 1.2 1.0 - 2.5 (calc)   Total Bilirubin 0.8 0.2 - 1.2 mg/dL   Alkaline phosphatase (APISO) 65 35 - 144 U/L   AST 13 10 - 35 U/L   ALT 8 (L) 9 - 46 U/L  CBC with Differential/Platelet  Result Value Ref Range   WBC 6.5 3.8 - 10.8 Thousand/uL   RBC 4.62 4.20 - 5.80 Million/uL   Hemoglobin 13.5 13.2 - 17.1 g/dL   HCT 39.8 38.5 - 50.0 %   MCV 86.1 80.0 - 100.0 fL   MCH 29.2 27.0 - 33.0 pg   MCHC 33.9 32.0 - 36.0 g/dL   RDW 14.6 11.0 - 15.0 %   Platelets 191 140 - 400 Thousand/uL   MPV 11.2 7.5 - 12.5 fL   Neutro Abs 3,874 1,500 - 7,800 cells/uL   Lymphs Abs 1,723 850 - 3,900 cells/uL   Absolute Monocytes 709 200 - 950 cells/uL   Eosinophils Absolute 156 15 - 500 cells/uL   Basophils Absolute 39 0 - 200 cells/uL   Neutrophils Relative % 59.6 %   Total Lymphocyte 26.5 %   Monocytes Relative 10.9 %   Eosinophils Relative 2.4 %   Basophils Relative 0.6 %  Lipid panel  Result Value Ref Range   Cholesterol 156 <200 mg/dL   HDL 33 (L) > OR = 40 mg/dL   Triglycerides 211 (H) <150 mg/dL   LDL Cholesterol (Calc) 92 mg/dL (calc)   Total CHOL/HDL Ratio 4.7 <5.0 (calc)   Non-HDL Cholesterol (Calc) 123 <130 mg/dL (calc)  Hemoglobin A1c  Result Value Ref Range   Hgb A1c MFr Bld 8.0  (H) <5.7 % of total Hgb   Mean Plasma Glucose 183 mg/dL   eAG (mmol/L) 10.1 mmol/L  PSA  Result Value Ref Range   PSA 2.16 < OR = 4.00 ng/mL  TSH  Result Value Ref Range   TSH 2.69 0.40 - 4.50 mIU/L   *Note: Due to a large number of results and/or encounters for the requested time period, some results have not been displayed. A complete set of results can be found in Results Review.      Assessment & Plan:   Problem List Items Addressed This Visit     Centrilobular emphysema ([place])   Controlled type 2 diabetes mellitus with diabetic nephropathy, with long-term current use of insulin ([place])   Relevant Orders   Lipid panel (Completed)   Hemoglobin A1c (Completed)   Hyperlipidemia associated with type 2 diabetes mellitus (HCC)   Relevant Orders   COMPLETE METABOLIC PANEL WITH GFR (Completed)   Lipid panel (Completed)   TSH (Completed)   Other Visit Diagnoses     Annual physical exam    -  Primary   Relevant Orders   COMPLETE METABOLIC PANEL WITH GFR (Completed)   CBC with Differential/Platelet (Completed)   Lipid panel (Completed)   Hemoglobin A1c (Completed)   PSA (Completed)   TSH (Completed)   Needs flu shot       Relevant Orders   Flu Vaccine QUAD High Dose(Fluad) (Completed)   Nocturia       Relevant Orders   PSA (  Completed)   Chronic heart failure with preserved ejection fraction (HFpEF) (HCC)       Relevant Orders   COMPLETE METABOLIC PANEL WITH GFR (Completed)   CBC with Differential/Platelet (Completed)        Labs today. For yearly physical  Keep on current medications  Take Gabapentin 667m twice a day. I would AVOID the extra dose of 300 late night if feet wake you up. You can take Tylenol 50107mx 2 = 100068m Recommend Alpha Lipoic Acid - nerve health supplement for neuropathy / restless leg.  Orders Placed This Encounter  Procedures   Flu Vaccine QUAD High Dose(Fluad)   COMPLETE METABOLIC PANEL WITH GFR   CBC with Differential/Platelet   Lipid  panel    Order Specific Question:   Has the patient fasted?    Answer:   Yes   Hemoglobin A1c   PSA   TSH     No orders of the defined types were placed in this encounter.     Follow up plan: Return in about 6 months (around 06/05/2022) for 6 month DM A1c.  AleNobie PutnamO [employer] 12/05/2021, 3:21 PM

## 2021-12-05 NOTE — Patient Instructions (Addendum)
It was great to see you today! No medication changes are needed at this time.   Labs needed in 2-3 weeks -please take the attached order to your lab visit   Your physician wants you to follow-up in: 4 months (Jan 2024) with Dr Haroldine Laws. You will receive a reminder letter in the mail two months in advance. If you don't receive a letter, please call our office to schedule the follow-up appointment in November.     Do the following things EVERYDAY: Weigh yourself in the morning before breakfast. Write it down and keep it in a log. Take your medicines as prescribed Eat low salt foods--Limit salt (sodium) to 2000 mg per day.  Stay as active as you can everyday Limit all fluids for the day to less than 2 liters  At the [facility], you and your health needs are our priority. As part of our continuing mission to provide you with exceptional heart care, we have created designated Provider Care Teams. These Care Teams include your primary Cardiologist (physician) and Advanced Practice Providers (APPs- Physician Assistants and Nurse Practitioners) who all work together to provide you with the care you need, when you need it.   You may see any of the following providers on your designated Care Team at your next follow up: Dr Glori Bickers Dr Loralie Champagne Dr. Roxana Hires, NP Lyda Jester, [place] [facility] [place], [place] [name], NP Audry Riles, PharmD   Please be sure to bring in all your medications bottles to every appointment.   If you have any questions or concerns before your next appointment please send [place] a message through [place] or call our office at [phone].    TO LEAVE A MESSAGE FOR THE NURSE SELECT OPTION 2, PLEASE LEAVE A MESSAGE INCLUDING: YOUR NAME DATE OF BIRTH CALL BACK NUMBER REASON FOR CALL**this is important as we prioritize the call backs  YOU WILL RECEIVE A CALL BACK THE SAME DAY AS LONG AS YOU CALL BEFORE  4:00 PM

## 2021-12-05 NOTE — Patient Instructions (Addendum)
Thank you for coming to the office today.  Labs today. For yearly physical  Keep on current medications  Take Gabapentin '600mg'$  twice a day. I would AVOID the extra dose of 300 late night if feet wake you up. You can take Tylenol '500mg'$  x 2 = '1000mg'$    Recommend Alpha Lipoic Acid - nerve health supplement for neuropathy / restless leg.   Please schedule a Follow-up Appointment to: Return in about 6 months (around 06/05/2022) for 6 month DM A1c.  If you have any other questions or concerns, please feel free to call the office or send a message through [place]. You may also schedule an earlier appointment if necessary.  Additionally, you may be receiving a survey about your experience at our office within a few days to 1 week by e-mail or mail. We value your feedback.  [name], DO [place]

## 2021-12-05 NOTE — Progress Notes (Signed)
Heart Failure Tele Health Note  Due to national recommendations of social distancing due to Lucas Caldwell, Audio/video telehealth visit is felt to be most appropriate for this patient at this time.  See MyChart message from today for patient consent regarding telehealth for [facility].  Date:  12/05/2021   ID:  Lucas Caldwell, DOB 06-01-1933, MRN [id number]  Location: Home  Provider location: [place] [facility] Type of Visit: Established patient   PCP:  [name], DO  Cardiologist:  [name], MD HF Cardiologist: [name]  Chief Complaint:  Chief Complaint  Patient presents with   Telephone visit   Heart failure follow up   HPI: Lucas Caldwell is a 88 y.o. 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 Saunders Revel for further evaluation of his HF.     Has h/o NICM previously followed by Dr. Carolynn Serve at [facility]. He had good response to medical therapy in 2012 EF was reported to be 50%    Had PCI of his RCA CAD s/p RCA stent x2 08/2016 at [facility].    Echo 6/20 LVEF 35-40%   Cardiac cath 08/2018 70% LM stenosis with abnl FFR of 0.86, though drop in iFR was due to diffuse prox LAD disease and only small gradient involving ostial LM. Imdur was increased.    Admitted 04/13/19 with chest pain and A/C systolic heart failure. Repeat Cath 1/21 LM 60% LAD 70% with +FFR otherwise diffuse non-obstructive CAD -> PCI/DES LAD 1/21. RHC: RA 10 PA 53/30 (40) PCWP 28 CO/CI 4.2/2.1 PA sat 67%.   Echo 04/22/19 EF 30-35%   Referred to [facility] 04/2019 for further management of his systolic HF. Had cardiomems implanted in 7/22   Zio  4/21 SR w/ 1st AVB, avg HR 81 bpm, 1 brief 5 beat run of NSVT, Two runs SVT   Echo 8/22 EF 30-35% RV ok   Today he returns for HF follow up via phone. Overall feeling fine. He has SOB walking further distances on flat ground. + bendopnea.  Has some swelling in feet but goes down after taking meds. Has occasional dizziness when standing, but no falls. Still walks to the mailbox without SOB and carries wood inside. Denies CP, palpitations, or PND/Orthopnea. Appetite ok. No fever or chills. Weight at home 194 pounds. Taking all medications. He presents via Psychiatric nurse for a telehealth visit today.      he denies symptoms worrisome for COVID Caldwell.   Past Medical History:  Diagnosis Date   Arthritis    "probably in his legs" (08/15/2016)   Blind right eye    BPH (benign prostatic hyperplasia)    Breast asymmetry    Left breast is larger, present for several years.   CAD (coronary artery disease)    a. Cath in the late 90's - reportedly ok;  b. 2014 s/p stenting x 2 @ [facility]; c 08/19/16 Cath/PCI with DES -> RCA, plan to treat LM 70% medically. Seen by surgery and felt to be too high risk for CABG; d. 08/2018 Cath: LM 70 (iFR 0.86), LAD 20ost, 40p, 50/60m D1 70, LCX patent stent, OM1 30, RCA patent stent, 422mSR, 40d, RPDA 30. PCWP 8. CO/CI 4.0/2.1;  e. 03/2019 PCI to LAD (2.5x15 Resolute Onyx DES).   Chronic combined systolic (congestive) and diastolic (congestive) heart failure ([place])    a. Previously reduced EF-->50% by echo in 2012;  b. 06/2015 Echo: EF 50-55%  c. 07/2016 Echo: EF 45-50%; d. 12/2017 Echo: EF 55%; e. 08/2018 Echo: EF 35-40%; f. 04/2019 Echo: EF 30-35%; g. s/p cardiomems; h. 10/2020 Echo: EF 30-35%, mod-sev glob HK, gr1 DD.   Chronic kidney disease (CKD), stage IV (severe) (HCC)    CML (chronic myelocytic leukemia) (HCC)    GERD (gastroesophageal reflux disease)    GIB (gastrointestinal bleeding)    a. 12/2017 3 unit PRBC GIB in setting of coumadin-->Endo/colon multiple duodenal ulcers  and a single bleeding ulcer in the proximal ascending colon status post hemostatic clipping x2.   Gout    Hyperlipemia    Hypertension    Hypothyroidism    Iron deficiency anemia    Ischemic cardiomyopathy    a. Previously reduced EF-->50% by echo in 2012;  b. 06/2015 Echo: EF 50-55%, Gr2 DD;  c. 07/2016 Echo: EF 45-50%; d. 12/2017 Echo: EF 55%, Gr1 DD, mild MR; e. 08/2018 Echo: EF 35-40%; f. 04/2019 Echo: EF 30-35%; g. 10/2020 Echo: EF 30-35%.   Migraine    "in the 1960s" (08/15/2016)   Obstructive sleep apnea    Orthostatic hypotension    a. 20222 -->carvedilol and hydralazine d/c'd.   PAF (paroxysmal atrial fibrillation) (HCC)    a. ? Dx 2014-->s/p DCCV;  b. CHA2DS2VASc = 6--> Coumadin.   Prostate cancer ([place])    RBBB    Type II diabetes mellitus [name])    Past Surgical History:  Procedure Laterality Date   AMPUTATION TOE Right 02/23/2021   Procedure: AMPUTATION TOE;  Surgeon: Samara Deist, DPM;  Location: [facility] ORS;  Service: Podiatry;  Laterality: Right;   CATARACT EXTRACTION W/ INTRAOCULAR LENS  IMPLANT, BILATERAL Bilateral    COLONOSCOPY N/A 01/13/2018   Procedure: COLONOSCOPY;  Surgeon: Lin Landsman, MD;  Location: [facility] ENDOSCOPY;  Service: Gastroenterology;  Laterality: N/A;   COLONOSCOPY WITH PROPOFOL N/A 01/01/2018   Procedure: COLONOSCOPY WITH PROPOFOL;  Surgeon: Lin Landsman, MD;  Location: [facility] ENDOSCOPY;  Service: Gastroenterology;  Laterality: N/A;   CORONARY ANGIOPLASTY WITH STENT PLACEMENT  09/2012   2 stents   CORONARY STENT INTERVENTION N/A 08/19/2016   Procedure: Coronary Stent Intervention;  Surgeon: [name], MD;  Location: [place] CV LAB;  Service: Cardiovascular;  Laterality: N/A;   CORONARY STENT INTERVENTION N/A 04/13/2019   Procedure: CORONARY STENT INTERVENTION;  Surgeon: [name], MD;  Location: [place] CV LAB;  Service: Cardiovascular;  Laterality: N/A;   ESOPHAGEAL MANOMETRY N/A 12/16/2017   Procedure: ESOPHAGEAL  MANOMETRY (EM);  Surgeon: Lin Landsman, MD;  Location: [facility] ENDOSCOPY;  Service: Gastroenterology;  Laterality: N/A;   ESOPHAGOGASTRODUODENOSCOPY N/A 12/20/2016   Procedure: ESOPHAGOGASTRODUODENOSCOPY (EGD);  Surgeon: Lin Landsman, MD;  Location[facility] ENDOSCOPY;  Service: Gastroenterology;  Laterality: N/A;   ESOPHAGOGASTRODUODENOSCOPY N/A 01/13/2018   Procedure: ESOPHAGOGASTRODUODENOSCOPY (EGD);  Surgeon: Lin Landsman, MD;  Location[facility] ENDOSCOPY;  Service: Gastroenterology;  Laterality: N/A;   ESOPHAGOGASTRODUODENOSCOPY (EGD) WITH PROPOFOL N/A 8/Caldwell/2019   Procedure: ESOPHAGOGASTRODUODENOSCOPY (EGD) WITH PROPOFOL;  Surgeon: Lin Landsman, MD;  Location: [facility] ENDOSCOPY;  Service: Gastroenterology;  Laterality: N/A;   EYE SURGERY     HERNIA REPAIR     "navel"   LEFT HEART CATH AND CORONARY ANGIOGRAPHY N/A 08/14/2016   Procedure: Left Heart Cath and Coronary Angiography;  Surgeon:  End, [name], MD;  Location: [place] CV LAB;  Service: Cardiovascular;  Laterality: N/A;   PRESSURE SENSOR/CARDIOMEMS N/A 09/23/2019   Procedure: PRESSURE SENSOR/CARDIOMEMS;  Surgeon: Jolaine Artist, MD;  Location: [place] CV LAB;  Service: Cardiovascular;  Laterality: N/A;   RIGHT HEART CATH N/A 09/23/2019   Procedure: RIGHT HEART CATH;  Surgeon: Jolaine Artist, MD;  Location: [place] CV LAB;  Service: Cardiovascular;  Laterality: N/A;   RIGHT/LEFT HEART CATH AND CORONARY ANGIOGRAPHY N/A 09/01/2018   Procedure: RIGHT/LEFT HEART CATH AND CORONARY ANGIOGRAPHY;  Surgeon: [name], MD;  Location: [place] CV LAB;  Service: Cardiovascular;  Laterality: N/A;   RIGHT/LEFT HEART CATH AND CORONARY ANGIOGRAPHY N/A 04/13/2019   Procedure: RIGHT/LEFT HEART CATH AND CORONARY ANGIOGRAPHY;  Surgeon: [name], MD;  Location: [place] CV LAB;  Service: Cardiovascular;  Laterality: N/A;   TOE AMPUTATION Right    "big toe"   Current Outpatient Medications   Medication Sig Dispense Refill   acetaminophen (TYLENOL) 500 MG tablet Take 1,000 mg by mouth every 6 (six) hours as needed for moderate pain or headache.      Alcohol Swabs (B-D SINGLE USE SWABS REGULAR) PADS Use to check blood sugar up to 2 times daily 200 each 3   allopurinol (ZYLOPRIM) 100 MG tablet Take 1 tablet (100 mg total) by mouth daily. 90 tablet 3   aspirin 81 MG chewable tablet Chew 81 mg by mouth daily.     Blood Glucose Calibration (OT ULTRA/FASTTK CNTRL SOLN) SOLN      Blood Glucose Monitoring Suppl (ONE TOUCH ULTRA 2) w/Device KIT SMARTSIG:Via Meter     bosutinib (BOSULIF) 100 MG tablet Take 100 mg by mouth at bedtime. Take with food.     Cholecalciferol 25 MCG (1000 UT) capsule Take 1,000 Units by mouth daily.     famotidine (PEPCID) 40 MG tablet Take 1 tablet (40 mg total) by mouth at bedtime. 90 tablet 0   gabapentin (NEURONTIN) 600 MG tablet Take 600 mg by mouth 2 (two) times daily.     glucose blood (ONETOUCH VERIO) test strip USE TO TEST BLOOD SUGAR EVERY MORNING AND EVERY NIGHT AT BEDTIME 100 strip 0   Insulin Glargine ([name] KWIKPEN) 100 UNIT/ML Inject 22 Units into the skin daily.     Insulin Pen Needle 31G X 5 MM MISC Use to inject insulin daily. 100 each 3   isosorbide mononitrate (IMDUR) 60 MG 24 hr tablet TAKE 1/2 TABLET(30 MG) BY MOUTH DAILY 45 tablet 0   JARDIANCE 10 MG TABS tablet Take 1 tablet (10 mg total) by mouth daily with breakfast. 90 tablet 1   Lancets (ONETOUCH DELICA PLUS [id number]) MISC Use to check blood sugar twice daily 200 each 3   levothyroxine (SYNTHROID) 25 MCG tablet TAKE ONE TABLET BY MOUTH EVERY MORNING BEFORE BREAKFAST 90 tablet 1   losartan (COZAAR) 25 MG tablet TAKE 1/2 TABLET(12.5 MG) BY MOUTH DAILY 45 tablet 1   Menthol-Zinc Oxide (GOLD BOND EX) Apply 1 application topically daily as needed (muscle pain).     metoprolol succinate (TOPROL-XL) 25 MG 24 hr tablet Take 1 tablet by mouth daily.     mirabegron ER (MYRBETRIQ) 25 MG TB24  tablet Take 1 tablet (25 mg total) by mouth daily. 90 tablet 3   Multiple Vitamin (MULTIVITAMIN WITH MINERALS) TABS tablet Take 1 tablet by mouth daily.     nitroGLYCERIN (NITROSTAT) 0.4 MG SL tablet Place 1 tablet (0.4 mg total) under the tongue every 5 (  five) minutes x 3 doses as needed for chest pain. 25 tablet 1   Polyethyl Glycol-Propyl Glycol (SYSTANE OP) Place 1 drop into both eyes 3 (three) times daily as needed (dry/irritated eyes.).      polyethylene glycol (MIRALAX / GLYCOLAX) 17 g packet Take 17 g by mouth daily as needed for mild constipation.      ranolazine (RANEXA) 500 MG 12 hr tablet TAKE ONE TABLET BY MOUTH TWICE DAILY 180 tablet 0   rosuvastatin (CRESTOR) 5 MG tablet Take 1 tablet (5 mg total) by mouth daily. 90 tablet 3   tamsulosin (FLOMAX) 0.4 MG CAPS capsule Take 1 capsule (0.4 mg total) by mouth daily after supper. 30 capsule 11   torsemide (DEMADEX) 20 MG tablet Take 3 tablets (60 mg total) by mouth 2 (two) times daily. 180 tablet 3   traMADol (ULTRAM) 50 MG tablet Take 1-2 tablets (50-100 mg total) by mouth at bedtime as needed. 30 tablet 2   warfarin (COUMADIN) 3 MG tablet TAKE 1 TO 1 & 1/2 TABLETS BY MOUTH AS DIRECTED BY THE COUMADIN CLINIC Strength: 3 mg 135 tablet 0   No current facility-administered medications for this encounter.    Allergies:   Clopidogrel, Ciprofloxacin, Beta adrenergic blockers, Mirtazapine, and Spironolactone   Social History:  The patient  reports that he quit smoking about 48 years ago. His smoking use included cigarettes. He started smoking about 68 years ago. He has a 35.00 pack-year smoking history. He has quit using smokeless tobacco. He reports that he does not drink alcohol and does not use drugs.   Family History:  The patient's family history includes Brain cancer in his father; Diabetes in his sister; Heart attack in his sister.   ROS:  Please see the history of present illness.   All other systems are personally reviewed and  negative.   Exam:  (Video/Tele Health Call; Exam is subjective and or/visual.) General:  Speaks in full sentences. No resp difficulty. Lungs: Normal respiratory effort with conversation.  Abdomen: Non-distended per patient report Extremities: Pt has pedal edema. Neuro: Alert & oriented x 3.   Recent Labs: 02/01/2021: B Natriuretic Peptide 41.0 02/22/2021: ALT 10 11/14/2021: BUN 30; Creatinine, Ser 1.73; Hemoglobin 13.7; Platelets 187; Potassium 3.7; Sodium 138  Personally reviewed   Wt Readings from Last 3 Encounters:  12/05/21 87.5 kg (193 lb)  11/21/21 87.5 kg (193 lb)  11/15/21 89.5 kg (197 lb 6.4 oz)    Pulse (!) 56 Comment: obtained at home  Wt 87.5 kg (193 lb) Comment: obtained at home  SpO2 91% Comment: obtained at home  BMI 33.13 kg/m   ASSESSMENT AND PLAN: 1. Chronic systolic HF: - likely combination of iCM/NICM - Echo (2/21): EF 30-35%  (was 35-40% in 6/20) - RHC (04/13/19): with elevated filling pressures but BNP normal  - Echo (8/22): EF 30-35%, grade I DD, RV low normal - Stable NYHA III, Volume mildly up on Cardiomems Caldwell (goal 15). Weight stable. - Take extra torsemide 20 mg daily x 3 days (80/60 x 3 days) then back to torsemide 60 mg bid. 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 HR 54 on home check, may need to decrease further. - Continue losartan 12.5 mg daily (dose decreased due to low BP). He has BP cuff, I asked wife to help him check daily. - Continue Jardiance 10 mg daily . - Continue Imdur 30 mg daily. Without hydral may be able to stop soon unless he needs  for CAD - He has been off hydralazine > 1 year. - Check BMET in 2 weeks.  2. CAD - Cath 04/13/19 LAD 70% with +FFR otherwise diffuse non-obstructive CAD  - S/p PCI/DES to LAD 1/21 - Followed by Dr. Saunders Revel - No chest pain. - Continue medical management.  3. CKD Stage 3a - baseline SCr ~1.5  - Avoid hypotension. - Recheck BMET in a couple weeks.  4. DM  - Continue  Jardiance 10 mg daily. - No GU symptoms  5. PAF - Zio patch 4/21 showed no AF. Mostly NSR w/ brief SVT/ NSVT, PACs and PVCs - No palpitations. - Continue warfarin.   COVID screen The patient does not have any symptoms that suggest any further testing/ screening at this time.  Social distancing reinforced today.  Patient Risk: After full review of this patients clinical status, I feel that they are at moderate risk for cardiac decompensation at this time.  Relevant cardiac medications were reviewed at length with the patient today. The patient does not have concerns regarding their medications at this time.   The following changes were made today:  increase torsemide to 80 mg q am/60 mg q pm x 3 days then back to 60 mg bid. BMET in 2 weeks.  Recommended follow-up:  3-4 months  Today, I have spent 10 minutes with the patient with telehealth technology discussing the above issues .    Frankey Poot, FNP  12/05/2021 11:05 AM  [facility] [address] [facility] [place] [address] [phone] (office) [phone] (fax)

## 2021-12-05 NOTE — Addendum Note (Signed)
Encounter addended by: Kerry Dory, CMA on: 12/05/2021 11:44 AM  Actions taken: Clinical Note Signed, Order list changed, Diagnosis association updated

## 2021-12-06 LAB — CBC WITH DIFFERENTIAL/PLATELET
Absolute Monocytes: 709 cells/uL (ref 200–950)
Basophils Absolute: 39 cells/uL (ref 0–200)
Basophils Relative: 0.6 %
Eosinophils Absolute: 156 cells/uL (ref 15–500)
Eosinophils Relative: 2.4 %
HCT: 39.8 % (ref 38.5–50.0)
Hemoglobin: 13.5 g/dL (ref 13.2–17.1)
Lymphs Abs: 1723 cells/uL (ref 850–3900)
MCH: 29.2 pg (ref 27.0–33.0)
MCHC: 33.9 g/dL (ref 32.0–36.0)
MCV: 86.1 fL (ref 80.0–100.0)
MPV: 11.2 fL (ref 7.5–12.5)
Monocytes Relative: 10.9 %
Neutro Abs: 3874 cells/uL (ref 1500–7800)
Neutrophils Relative %: 59.6 %
Platelets: 191 10*3/uL (ref 140–400)
RBC: 4.62 10*6/uL (ref 4.20–5.80)
RDW: 14.6 % (ref 11.0–15.0)
Total Lymphocyte: 26.5 %
WBC: 6.5 10*3/uL (ref 3.8–10.8)

## 2021-12-06 LAB — LIPID PANEL
Cholesterol: 156 mg/dL (ref <200)
HDL: 33 mg/dL — ABNORMAL LOW (ref >=40)
LDL Cholesterol (Calc): 92 mg/dL (calc)
Non-HDL Cholesterol (Calc): 123 mg/dL (calc) (ref <130)
Total CHOL/HDL Ratio: 4.7 (calc) (ref <5.0)
Triglycerides: 211 mg/dL — ABNORMAL HIGH (ref <150)

## 2021-12-06 LAB — COMPLETE METABOLIC PANEL WITH GFR
AG Ratio: 1.2 (calc) (ref 1.0–2.5)
ALT: 8 U/L — ABNORMAL LOW (ref 9–46)
AST: 13 U/L (ref 10–35)
Albumin: 4.3 g/dL (ref 3.6–5.1)
Alkaline phosphatase (APISO): 65 U/L (ref 35–144)
BUN/Creatinine Ratio: 16 (calc) (ref 6–22)
BUN: 31 mg/dL — ABNORMAL HIGH (ref 7–25)
CO2: 27 mmol/L (ref 20–32)
Calcium: 9.8 mg/dL (ref 8.6–10.3)
Chloride: 100 mmol/L (ref 98–110)
Creat: 1.96 mg/dL — ABNORMAL HIGH (ref 0.70–1.22)
Globulin: 3.7 g/dL (calc) (ref 1.9–3.7)
Glucose, Bld: 155 mg/dL — ABNORMAL HIGH (ref 65–99)
Potassium: 4 mmol/L (ref 3.5–5.3)
Sodium: 138 mmol/L (ref 135–146)
Total Bilirubin: 0.8 mg/dL (ref 0.2–1.2)
Total Protein: 8 g/dL (ref 6.1–8.1)
eGFR: 33 mL/min/{1.73_m2} — ABNORMAL LOW (ref >=60)

## 2021-12-06 LAB — HEMOGLOBIN A1C
Hgb A1c MFr Bld: 8 % of total Hgb — ABNORMAL HIGH (ref <5.7)
Mean Plasma Glucose: 183 mg/dL
eAG (mmol/L): 10.1 mmol/L

## 2021-12-06 LAB — TSH: TSH: 2.69 mIU/L (ref 0.40–4.50)

## 2021-12-06 LAB — PSA: PSA: 2.16 ng/mL (ref <=4.00)

## 2021-12-11 ENCOUNTER — Emergency Department
Admission: EM | Admit: 2021-12-11 | Discharge: 2021-12-11 | Discharge status: Against Medical Advice | Payer: Medicare HMO | Attending: Emergency Medicine | Admitting: Emergency Medicine

## 2021-12-11 ENCOUNTER — Emergency Department: Payer: Medicare HMO

## 2021-12-11 ENCOUNTER — Other Ambulatory Visit: Payer: Self-pay

## 2021-12-11 ENCOUNTER — Telehealth: Payer: Self-pay | Admitting: Internal Medicine

## 2021-12-11 DIAGNOSIS — Z5321 Procedure and treatment not carried out due to patient leaving prior to being seen by health care provider: Secondary | ICD-10-CM | POA: Insufficient documentation

## 2021-12-11 DIAGNOSIS — R110 Nausea: Secondary | ICD-10-CM | POA: Insufficient documentation

## 2021-12-11 DIAGNOSIS — R0602 Shortness of breath: Secondary | ICD-10-CM | POA: Diagnosis not present

## 2021-12-11 DIAGNOSIS — R112 Nausea with vomiting, unspecified: Secondary | ICD-10-CM | POA: Diagnosis not present

## 2021-12-11 DIAGNOSIS — R079 Chest pain, unspecified: Secondary | ICD-10-CM | POA: Diagnosis not present

## 2021-12-11 LAB — BASIC METABOLIC PANEL
Anion gap: 10 (ref 5–15)
BUN: 32 mg/dL — ABNORMAL HIGH (ref 8–23)
CO2: 29 mmol/L (ref 22–32)
Calcium: 9.6 mg/dL (ref 8.9–10.3)
Chloride: 101 mmol/L (ref 98–111)
Creatinine, Ser: 1.73 mg/dL — ABNORMAL HIGH (ref 0.61–1.24)
GFR, Estimated: 38 mL/min — ABNORMAL LOW (ref >60)
Glucose, Bld: 134 mg/dL — ABNORMAL HIGH (ref 70–99)
Potassium: 3.8 mmol/L (ref 3.5–5.1)
Sodium: 140 mmol/L (ref 135–145)

## 2021-12-11 LAB — CBC
HCT: 43.9 % (ref 39.0–52.0)
Hemoglobin: 14.1 g/dL (ref 13.0–17.0)
MCH: 28.7 pg (ref 26.0–34.0)
MCHC: 32.1 g/dL (ref 30.0–36.0)
MCV: 89.2 fL (ref 80.0–100.0)
Platelets: 179 10*3/uL (ref 150–400)
RBC: 4.92 MIL/uL (ref 4.22–5.81)
RDW: 14.6 % (ref 11.5–15.5)
WBC: 9.4 10*3/uL (ref 4.0–10.5)
nRBC: 0 % (ref 0.0–0.2)

## 2021-12-11 LAB — TROPONIN I (HIGH SENSITIVITY)
Troponin I (High Sensitivity): 14 ng/L (ref <18)
Troponin I (High Sensitivity): 16 ng/L (ref <18)

## 2021-12-11 MED ORDER — NITROGLYCERIN 0.4 MG SL SUBL: SUBLINGUAL_TABLET | SUBLINGUAL | 1 refills | Status: DC

## 2021-12-11 NOTE — ED Notes (Signed)
Called pt at home for whereabouts.  Pt is home.

## 2021-12-11 NOTE — Telephone Encounter (Signed)
Pt states he has been feeling very weak and fatigue like he is giving out. Pt really wanted to come in so pt made an appt tomorrow at 4:00pm

## 2021-12-11 NOTE — ED Notes (Signed)
No answer x3 when ca;lled for repeat vital signs

## 2021-12-11 NOTE — Telephone Encounter (Addendum)
Returned call and spoke with wife who stated she is in the car and will call me back. She stated patient is at home. Patient reported SOB and chest tightness since last night. He also reports no appetite. He denies pain in arm, jaw, back. His NTG is "really old." Recommended he go to the ED. He stated his wife will be back from the post office soon. Refilled NTG. Unable to reach wife at this time. 2:48 wife called clinic. Recommended patient be taken to the ED for chest tightness and sob. She verbalized understanding.

## 2021-12-11 NOTE — ED Triage Notes (Signed)
Pt complains of chest pain that started this morning with nausea. Denies any vomiting, denies SOB at this time but states sometimes SOB comes and goes.

## 2021-12-12 ENCOUNTER — Ambulatory Visit: Payer: Medicare HMO | Admitting: Internal Medicine
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
## 2021-12-13 ENCOUNTER — Encounter: Payer: Self-pay | Admitting: Internal Medicine

## 2021-12-13 ENCOUNTER — Ambulatory Visit: Payer: Medicare HMO | Attending: Internal Medicine | Admitting: Internal Medicine

## 2021-12-13 VITALS — BP 135/82 | HR 87 | Ht 64.0 in | Wt 200.0 lb

## 2021-12-13 DIAGNOSIS — I480 Paroxysmal atrial fibrillation: Secondary | ICD-10-CM | POA: Diagnosis not present

## 2021-12-13 DIAGNOSIS — I5022 Chronic systolic (congestive) heart failure: Secondary | ICD-10-CM

## 2021-12-13 DIAGNOSIS — I25118 Atherosclerotic heart disease of native coronary artery with other forms of angina pectoris: Secondary | ICD-10-CM
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
## 2021-12-13 NOTE — Patient Instructions (Signed)
Medication Instructions:   I weight is greater than 195 on your scale at home please take '80mg'$  of torsemide in the AM and '60mg'$  of torsemide in the PM until your weight is below 195lb.   When your weight is below 195lb you can go back to taken torsemide '60mg'$  twice a day.   *If you need a refill on your cardiac medications before your next appointment, please call your pharmacy*   Lab Work:  None Ordered  If you have labs (blood work) drawn today and your tests are completely normal, you will receive your results only by: [place] (if you have MyChart) OR A paper copy in the mail If you have any lab test that is abnormal or we need to change your treatment, we will call you to review the results.   Testing/Procedures:  Echocardiogram   Your physician has requested that you have an echocardiogram. Echocardiography is a painless test that uses sound waves to create images of your heart. It provides your doctor with information about the size and shape of your heart and how well your heart's chambers and valves are working. This procedure takes approximately one hour. There are no restrictions for this procedure. Please note; depending on visual quality an IV may need to be placed.     Follow-Up: At [facility], you and your health needs are our priority.  As part of our continuing mission to provide you with exceptional heart care, we have created designated Provider Care Teams.  These Care Teams include your primary Cardiologist (physician) and Advanced Practice Providers (APPs -  Physician Assistants and Nurse Practitioners) who all work together to provide you with the care you need, when you need it.  We recommend signing up for the patient portal called "MyChart".  Sign up information is provided on this After Visit Summary.  MyChart is used to connect with patients for Virtual Visits (Telemedicine).  Patients are able to view lab/test results, encounter notes,  upcoming appointments, etc.  Non-urgent messages can be sent to your provider as well.   To learn more about what you can do with MyChart, go to [email or web address].    Your next appointment:   1 month(s)  The format for your next appointment:   In Person  Provider:   You may see [name], MD or one of the following Advanced Practice Providers on your designated Care Team:   [name], NP [name], PA-C Cadence Kathlen Mody, PA-C Gerrie Nordmann, NP      Important Information About Sugar

## 2021-12-14 ENCOUNTER — Encounter: Payer: Self-pay | Admitting: Internal Medicine

## 2021-12-17 ENCOUNTER — Telehealth (HOSPITAL_COMMUNITY): Payer: Self-pay | Admitting: Adult Health

## 2021-12-17 ENCOUNTER — Other Ambulatory Visit: Payer: Self-pay | Admitting: Internal Medicine

## 2021-12-17 NOTE — Telephone Encounter (Signed)
  Cardiomems Remote Monitoring  S/P Cardiomems Implant   PAD Goal: 15 Most recent reading: 20  Recommended changes: 12/13/21 torsemide was increased 12/13/21 to 80 mg /60 mg until weight drops below 195.   I continue to review and analyze the patients PA pressures weekly (and more often as needed) to bring PA pressures within the optimal range.     Lucas Lansky NP-C  10:11 AM

## 2021-12-19 ENCOUNTER — Ambulatory Visit: Payer: Medicare HMO | Attending: Internal Medicine

## 2021-12-19 DIAGNOSIS — Z7901 Long term (current) use of anticoagulants: Secondary | ICD-10-CM

## 2021-12-19 DIAGNOSIS — Z5181 Encounter for therapeutic drug level monitoring: Secondary | ICD-10-CM | POA: Diagnosis not present

## 2021-12-19 DIAGNOSIS — I480 Paroxysmal atrial fibrillation: Secondary | ICD-10-CM

## 2021-12-19 LAB — POCT INR: INR: 2.3 (ref 2.0–3.0)

## 2021-12-19 NOTE — Patient Instructions (Signed)
Continue dosage of warfarin of 1.5 TABLETS EVERY DAY EXCEPT 1 TABLET ON MONDAYS & FRIDAYS.  Recheck INR in 6 weeks 

## 2021-12-21 ENCOUNTER — Other Ambulatory Visit: Payer: Self-pay | Admitting: Family Medicine

## 2021-12-21 DIAGNOSIS — E1142 Type 2 diabetes mellitus with diabetic polyneuropathy: Secondary | ICD-10-CM

## 2021-12-21 NOTE — Telephone Encounter (Signed)
Requested Prescriptions  Pending Prescriptions Disp Refills  . gabapentin (NEURONTIN) 300 MG capsule [Pharmacy Med Name: GABAPENTIN '300MG'$  CAPSULES] 270 capsule 1    Sig: TAKE 1 CAPSULE(300 MG) BY MOUTH THREE TIMES DAILY     Neurology: Anticonvulsants - gabapentin Failed - 12/21/2021 12:19 PM      Failed - Cr in normal range and within 360 days    Creat  Date Value Ref Range Status  12/05/2021 1.96 (H) 0.70 - 1.22 mg/dL Final   Creatinine, Ser  Date Value Ref Range Status  12/11/2021 1.73 (H) 0.61 - 1.24 mg/dL Final   Creatinine, Urine  Date Value Ref Range Status  12/18/2016 96 mg/dL Final         Passed - Completed PHQ-2 or PHQ-9 in the last 360 days      Passed - Valid encounter within last 12 months    Recent Outpatient Visits          2 weeks ago Annual physical exam   [place], DO   1 month ago Acute pain of left shoulder   [place], PA-C   2 months ago Controlled type 2 diabetes mellitus with diabetic nephropathy, with long-term current use of insulin [facility])   [place], DO   5 months ago Controlled type 2 diabetes mellitus with diabetic nephropathy, with long-term current use of insulin [facility])   [place], DO   7 months ago Diarrhea, unspecified type   [place], [name], DO      Future Appointments            In 1 month Sharolyn Douglas, Clance Boll, NP [place]. [place]   In 2 months Sharolyn Douglas, Clance Boll, NP [facility] A Dept Of [place]. [place]   In 5 months [place], [name], DO [facility], [facility]

## 2021-12-24 ENCOUNTER — Other Ambulatory Visit: Payer: Self-pay | Admitting: Family Medicine

## 2021-12-24 DIAGNOSIS — E1142 Type 2 diabetes mellitus with diabetic polyneuropathy: Secondary | ICD-10-CM

## 2021-12-25 ENCOUNTER — Other Ambulatory Visit: Payer: Self-pay

## 2021-12-25 ENCOUNTER — Ambulatory Visit: Payer: Self-pay

## 2021-12-25 MED ORDER — GABAPENTIN 600 MG PO TABS: 600.0000 mg | ORAL_TABLET | Freq: Two times a day (BID) | ORAL | 1 refills | Status: DC

## 2021-12-25 NOTE — Telephone Encounter (Signed)
  Chief Complaint: medication assistance Symptoms: feet pain Frequency: 2-3 days Pertinent Negatives: NA Disposition: '[]'$ ED /'[]'$ Urgent Care (no appt availability in office) / '[]'$ Appointment(In office/virtual)/ '[]'$  [place] [facility]/ '[x]'$ Home Care/ '[]'$ Refused Recommended Disposition /'[]'$ [place] [facility]/ '[]'$  Follow-up with PCP Additional Notes: spoke with [place], wife. She states that gabapentin was refused and didn't understand why. I advised her that the gabapentin '300mg'$  dose. Refilled '600mg'$  dose and informed her of correct dosing. She states pt has been taking at night time and advised of what Dr. Raliegh Ip recommended in [place] note on 12/05/21. Wife verbalized understanding.   Summary: Medication refused? States it has not changed?   Refused Medication Requests    Gabapentin 300 MG TAKE 1 CAPSULE(300 MG) BY MOUTH THREE TIMES DAILY  Wife [name] is wanting to know why this is refused? Wants a fu call [phone]      Reason for Disposition  Caller has medicine question only, adult not sick, AND triager answers question  Answer Assessment - Initial Assessment Questions 1. NAME of MEDICINE: "What medicine(s) are you calling about?"     Gabapentin  2. QUESTION: "What is your question?" (e.g., double dose of medicine, side effect)     Needing refill on '600mg'$ , dont understand why it was refused 3. PRESCRIBER: "Who prescribed the medicine?" Reason: if prescribed by specialist, call should be referred to that group.     Dr. Raliegh Ip 4. SYMPTOMS: "Do you have any symptoms?" If Yes, ask: "What symptoms are you having?"  "How bad are the symptoms (e.g., mild, moderate, severe)     Feet pain  Protocols used: Medication Question Call-A-AH

## 2021-12-25 NOTE — Telephone Encounter (Signed)
Unable to refill per protocol, Rx expired. Medication was discontinued 7/723 due to dose change. Will refuse duplicate request.  Requested Prescriptions  Pending Prescriptions Disp Refills  . gabapentin (NEURONTIN) 300 MG capsule [Pharmacy Med Name: GABAPENTIN '300MG'$  CAPSULES] 270 capsule 1    Sig: TAKE 1 CAPSULE(300 MG) BY MOUTH THREE TIMES DAILY     Neurology: Anticonvulsants - gabapentin Failed - 12/24/2021  3:48 PM      Failed - Cr in normal range and within 360 days    Creat  Date Value Ref Range Status  12/05/2021 1.96 (H) 0.70 - 1.22 mg/dL Final   Creatinine, Ser  Date Value Ref Range Status  12/11/2021 1.73 (H) 0.61 - 1.24 mg/dL Final   Creatinine, Urine  Date Value Ref Range Status  12/18/2016 96 mg/dL Final         Passed - Completed PHQ-2 or PHQ-9 in the last 360 days      Passed - Valid encounter within last 12 months    Recent Outpatient Visits          2 weeks ago Annual physical exam   [place], DO   1 month ago Acute pain of left shoulder   [place], PA-C   2 months ago Controlled type 2 diabetes mellitus with diabetic nephropathy, with long-term current use of insulin [facility])   [place], DO   5 months ago Controlled type 2 diabetes mellitus with diabetic nephropathy, with long-term current use of insulin [facility])   [place], DO   7 months ago Diarrhea, unspecified type   [place], [name], DO      Future Appointments            In 4 weeks Sharolyn Douglas, Clance Boll, NP [place]. [place]   In 1 month Sharolyn Douglas, Clance Boll, NP [facility] A Dept Of [place]. [place]   In 5 months [place], [name], DO [facility], [facility]

## 2021-12-25 NOTE — Telephone Encounter (Signed)
Requested Prescriptions  Pending Prescriptions Disp Refills  . gabapentin (NEURONTIN) 600 MG tablet      Sig: Take 1 tablet (600 mg total) by mouth 2 (two) times daily.     Neurology: Anticonvulsants - gabapentin Failed - 12/25/2021  4:51 PM      Failed - Cr in normal range and within 360 days    Creat  Date Value Ref Range Status  12/05/2021 1.96 (H) 0.70 - 1.22 mg/dL Final   Creatinine, Ser  Date Value Ref Range Status  12/11/2021 1.73 (H) 0.61 - 1.24 mg/dL Final   Creatinine, Urine  Date Value Ref Range Status  12/18/2016 96 mg/dL Final         Passed - Completed PHQ-2 or PHQ-9 in the last 360 days      Passed - Valid encounter within last 12 months    Recent Outpatient Visits          2 weeks ago Annual physical exam   [place], DO   1 month ago Acute pain of left shoulder   [place], PA-C   2 months ago Controlled type 2 diabetes mellitus with diabetic nephropathy, with long-term current use of insulin [facility])   [place], DO   5 months ago Controlled type 2 diabetes mellitus with diabetic nephropathy, with long-term current use of insulin [facility])   [place], DO   7 months ago Diarrhea, unspecified type   [place], [name], DO      Future Appointments            In 4 weeks Sharolyn Douglas, Clance Boll, NP [place]. [place]   In 1 month Sharolyn Douglas, Clance Boll, NP [facility] A Dept Of [place]. [place]   In 5 months Parks Ranger, [name], DO [facility], [facility]

## 2021-12-27 ENCOUNTER — Other Ambulatory Visit: Payer: Self-pay | Admitting: Nurse Practitioner

## 2021-12-29 DIAGNOSIS — G4733 Obstructive sleep apnea (adult) (pediatric): Secondary | ICD-10-CM | POA: Diagnosis not present

## 2021-12-31 ENCOUNTER — Telehealth: Payer: Self-pay | Admitting: Internal Medicine

## 2021-12-31 ENCOUNTER — Other Ambulatory Visit: Payer: Self-pay | Admitting: Family Medicine

## 2021-12-31 ENCOUNTER — Ambulatory Visit (INDEPENDENT_AMBULATORY_CARE_PROVIDER_SITE_OTHER): Payer: Medicare HMO | Admitting: Pharmacist

## 2021-12-31 DIAGNOSIS — I5032 Chronic diastolic (congestive) heart failure: Secondary | ICD-10-CM

## 2021-12-31 DIAGNOSIS — E1142 Type 2 diabetes mellitus with diabetic polyneuropathy: Secondary | ICD-10-CM

## 2021-12-31 DIAGNOSIS — I5022 Chronic systolic (congestive) heart failure: Secondary | ICD-10-CM

## 2021-12-31 DIAGNOSIS — N184 Chronic kidney disease, stage 4 (severe): Secondary | ICD-10-CM

## 2021-12-31 DIAGNOSIS — Z794 Long term (current) use of insulin: Secondary | ICD-10-CM

## 2021-12-31 DIAGNOSIS — I10 Essential (primary) hypertension: Secondary | ICD-10-CM

## 2021-12-31 NOTE — Patient Instructions (Signed)
Visit Information  Thank you for taking time to visit with me today. Please don't hesitate to contact me if I can be of assistance to you before our next scheduled telephone appointment.  Following are the goals we discussed today:   Goals Addressed             This Visit's Progress    Pharmacy Goals       Please check your blood sugar, keep a log of the results and bring this with you to your medical appointments.  Please check your home blood pressure, keep a log of the results and bring this with you to your medical appointments.  Our goal bad cholesterol, or LDL, is less than 70 . This is why it is important to continue taking your rosuvastatin.  Feel free to call me with any questions or concerns. I look forward to our next call!  [name], PharmD, Para [place], CPP Clinical Pharmacist [facility] [phone]          Our next appointment is by telephone on 01/28/2022 at 10 am  Please call the care guide team at [phone] if you need to cancel or reschedule your appointment.    The patient verbalized understanding of instructions, educational materials, and care plan provided today and DECLINED offer to receive copy of patient instructions, educational materials, and care plan.

## 2021-12-31 NOTE — Telephone Encounter (Signed)
I called and spoke with Mrs. Sand (ok per [facility]) to confirm if the patient is taking metoprolol succinate.  Per Mrs. Jeudy, he DOES NOT take metoprolol succinate. I inquired if he has had night mares with beta blockers and she confirms this.   I advised Mrs. Kardell the metoprolol succinate was on the patient's medication list at the time of his last office visit with Dr. Saunders Revel on 12/13/21. She again advised he is not on this.   Mrs. Fiveash was made aware I just needed to confirm this. Will forward to Dr. Saunders Revel as an FYI/ for any further recommendations prior to calling the pharmacy back.

## 2021-12-31 NOTE — Telephone Encounter (Signed)
Pt c/o medication issue:  1. Name of Medication:   torsemide (DEMADEX) 20 MG tablet  2. How are you currently taking this medication (dosage and times per day)?   As prescribed  3. Are you having a reaction (difficulty breathing--STAT)?   No  4. What is your medication issue?   Wife would like clarification on how the patient should be taking this medication.

## 2021-12-31 NOTE — Chronic Care Management (AMB) (Signed)
Chronic Care Management CCM Pharmacy Note  12/31/2021 Name:  Lucas Caldwell MRN:  [id number] DOB:  1933-03-20   Subjective: Lucas Caldwell is an 88 y.o. year old male who is a primary patient of [name], DO.  The CCM team was consulted for assistance with disease management and care coordination needs.    Engaged with patient and wife by telephone for follow up visit for pharmacy case management and/or care coordination services.   Objective:  Medications Reviewed Today     Reviewed by Rennis Petty, RPH-CPP (Pharmacist) on 12/31/21 at [place] List Status: <None>   Medication Order Taking? Sig Documenting Provider Last Dose Status Informant  acetaminophen (TYLENOL) 500 MG tablet [id number]  Take 1,000 mg by mouth every 6 (six) hours as needed for moderate pain or headache.  [provider]  Active Spouse/Significant Other  Alcohol Swabs (B-D SINGLE USE SWABS REGULAR) PADS [id number]  Use to check blood sugar up to 2 times daily [name], DO  Active Spouse/Significant Other  allopurinol (ZYLOPRIM) 100 MG tablet [id number]  Take 1 tablet (100 mg total) by mouth daily. Theora Gianotti, NP  Active   aspirin 81 MG chewable tablet [id number]  Chew 81 mg by mouth daily. [provider]  Active Spouse/Significant Other  Blood Glucose Calibration (OT ULTRA/FASTTK CNTRL SOLN) SOLN [id number]   [provider]  Active   Blood Glucose Monitoring Suppl (ONE TOUCH ULTRA 2) w/Device KIT [id number]  SMARTSIG:Via Meter [provider]  Active   bosutinib (BOSULIF) 100 MG tablet [id number]  Take 100 mg by mouth at bedtime. Take with food. [provider]  Active Spouse/Significant Other           Med Note Jeanie Cooks Sep 15, 2019  8:38 AM)    Cholecalciferol 25 MCG (1000 UT) capsule [id number]  Take 1,000 Units by mouth daily. [provider]  Active Spouse/Significant Other  famotidine  (PEPCID) 40 MG tablet [id number]  Take 1 tablet (40 mg total) by mouth at bedtime. Karamalegos, [name], DO  Active   gabapentin (NEURONTIN) 600 MG tablet [id number] Yes Take 1 tablet (600 mg total) by mouth 2 (two) times daily. [name], DO Taking Active   glucose blood [facility] VERIO) test strip [id number]  USE TO TEST BLOOD SUGAR EVERY MORNING AND EVERY NIGHT AT BEDTIME [name], DO  Active   Insulin Glargine ([name] KWIKPEN) 100 UNIT/ML [id number]  Inject 22 Units into the skin daily. [provider]  Active   Insulin Pen Needle 31G X 5 MM MISC [id number]  Use to inject insulin daily. Karamalegos, [name], DO  Active   isosorbide mononitrate (IMDUR) 60 MG 24 hr tablet [id number]  Take 0.5 tablets (30 mg total) by mouth daily. End, [name], MD  Active   JARDIANCE 10 MG TABS tablet [id number] Yes Take 1 tablet (10 mg total) by mouth daily with breakfast. [name], DO Taking Active   Lancets (ONETOUCH DELICA PLUS [id number]) [place] [id number]  Use to check blood sugar twice daily [name], DO  Active   levothyroxine (SYNTHROID) 25 MCG tablet [id number]  TAKE ONE TABLET BY MOUTH EVERY MORNING BEFORE BREAKFAST [name], DO  Active   losartan (COZAAR) 25 MG tablet [id number] Yes TAKE 1/2 TABLET(12.5 MG) BY MOUTH DAILY End, [name], MD Taking Active   Menthol-Zinc Oxide (GOLD [place]) [id number]  Apply 1 application topically daily as needed (muscle pain).  [provider]  Active Spouse/Significant Other  metoprolol succinate (TOPROL-XL) 25 MG 24 hr tablet [id number] No Take 1 tablet by mouth daily.  Patient not taking: Reported on 12/31/2021   [provider] Not Taking Active   mirabegron ER (MYRBETRIQ) 25 MG TB24 tablet [id number]  Take 1 tablet (25 mg total) by mouth daily. Debroah Loop, PA-C  Active   Multiple Vitamin (MULTIVITAMIN WITH MINERALS) TABS tablet [id number]  Take 1  tablet by mouth daily. [provider]  Active Spouse/Significant Other  nitroGLYCERIN (NITROSTAT) 0.4 MG SL tablet [id number]  For chest pain, tightness, or pressure. While sitting, place 1 tablet under tongue. May be used every 5 minutes as needed, for up to 15 minutes. Do not use more than 3 tablets. End, [name], MD  Active   Polyethyl Glycol-Propyl Glycol [facility]) [id number]  Place 1 drop into both eyes 3 (three) times daily as needed (dry/irritated eyes.).  [provider]  Active Spouse/Significant Other  polyethylene glycol (MIRALAX / GLYCOLAX) 17 g packet [id number]  Take 17 g by mouth daily as needed for mild constipation.  [provider]  Active Spouse/Significant Other  ranolazine (RANEXA) 500 MG 12 hr tablet [id number]  TAKE 1 TABLET BY MOUTH TWICE DAILY. PLEASE KEEP UPCOMING APPOINTMENT FOR FUTURE REFILLS End, [name], MD  Active   rosuvastatin (CRESTOR) 5 MG tablet [id number]  Take 1 tablet (5 mg total) by mouth daily. End, [name], MD  Active   tamsulosin (FLOMAX) 0.4 MG CAPS capsule [id number]  Take 1 capsule (0.4 mg total) by mouth daily after supper. Rise Mu, PA-C  Active Spouse/Significant Other  torsemide (DEMADEX) 20 MG tablet [id number] Yes Take 3 tablets (60 mg total) by mouth 2 (two) times daily. Darrick Grinder D, NP Taking Active   traMADol (ULTRAM) 50 MG tablet [id number]  Take 1-2 tablets (50-100 mg total) by mouth at bedtime as needed. [name], DO  Active Spouse/Significant Other  warfarin (COUMADIN) 3 MG tablet [id number]  TAKE 1 TO 1 & 1/2 TABLETS BY MOUTH AS DIRECTED BY THE COUMADIN CLINIC Strength: 3 mg End, [name], MD  Active             Pertinent Labs:  Lab Results  Component Value Date   HGBA1C 8.0 (H) 12/05/2021   Lab Results  Component Value Date   CHOL 156 12/05/2021   HDL 33 (L) 12/05/2021   LDLCALC 92 12/05/2021   TRIG 211 (H) 12/05/2021   CHOLHDL 4.7 12/05/2021   Lab Results  Component  Value Date   CREATININE 1.73 (H) 12/11/2021   BUN 32 (H) 12/11/2021   NA 140 12/11/2021   K 3.8 12/11/2021   CL 101 12/11/2021   CO2 29 12/11/2021   BP Readings from Last 3 Encounters:  12/13/21 135/82  12/11/21 138/78  12/05/21 115/65   Pulse Readings from Last 3 Encounters:  12/13/21 87  12/11/21 78  12/05/21 (!) 56     SDOH:  (Social Determinants of Health) assessments and interventions performed:  SDOH Interventions    [place] Coordination from 11/05/2021 in [place] Coordination Office Visit from 10/05/2021 in [place] from 09/03/2021 in [place] Management from 10/02/2020 in [facility] Office Visit from 09/08/2019 in [place] Management from 06/10/2019 in [facility]  SDOH Interventions        Food Insecurity Interventions Intervention Not Indicated -- Intervention  Not Indicated -- -- --  Housing Interventions Intervention Not Indicated -- Intervention Not Indicated -- -- --  Transportation Interventions Intervention Not Indicated -- Intervention Not Indicated -- -- --  Depression Interventions/Treatment  -- Counseling -- -- Counseling --  Financial Strain Interventions Intervention Not Indicated -- Intervention Not Indicated -- -- --  Physical Activity Interventions Other (Comments)  [gets really short of breath due to chronic conditions] -- -- Other (Comments)  [gets easily short of breath. Limited to the activity he can do] -- Other (Comments)  [the patient is limited in ability to do activity or excercise due to shob and decreased mobility]  Stress Interventions -- -- Intervention Not Indicated -- -- --  Social Connections Interventions -- -- Intervention Not Indicated -- -- --       CCM Care Plan  Review of patient past medical history, allergies, medications, health status, including review of  consultants reports, laboratory and other test data, was performed as part of comprehensive evaluation and provision of chronic care management services.   Care Plan : PharmD - Medication Management/Assistance  Updates made by Rennis Petty, RPH-CPP since 12/31/2021 12:00 AM     Problem: Disease Progression      Long-Range Goal: Disease Progression Prevented or Minimized   Start Date: 04/14/2020  Expected End Date: 07/13/2020  Recent Progress: On track  Priority: High  Note:   Current Barriers:  Chronic Disease Management support, education, and care coordination needs related to type 2 diabetes, CKD, HFrEF s/p CardioMEMS, hyperlipidemia, atrial fibrillation, hypertension, coronary artery disease, CHF, hypothyroidism, and CML Financial Note patient has Partial Extra Help/[place] Subsidy Patient enrolled in [place] patient assistance program for 2023 calendar year Patient denied for patient assistance for [place] from [facility] for 2022 calendar year Patient previously enrolled in [place] for [employer] copayment assistance, but eligibility expected to expire on 06/09/2021 as not in use (currently no copayment through Part D plan Limited vision  Pharmacist Clinical Goal(s):  Over the next 90 days, patient will verbalize ability to afford treatment regimen through collaboration with PharmD and provider.   Interventions: 1:1 collaboration with [name], DO regarding development and update of comprehensive plan of care as evidenced by provider attestation and co-signature Inter-disciplinary care team collaboration (see longitudinal plan of care) Perform chart review Patient seen for Office Visit with [name] on 9/28. Provider advised patient to: Increase his torsemide to 80 mg QAM and 60 mg QPM until his weight drops below 195 pounds, at which time he can return to 60 mg BID.   He should increase the morning dose back  to 80 mg if his weight rises above 195 pounds again.   Will obtain an echo to reassess his cardiomyopathy.   "Continue current regimen of losartan, empagliflozin, and metoprolol succinate"  Type 2 Diabetes: Current treatment: [name] 22 units daily Jardiance 10 mg once daily in morning Previous therapies tried: Trulicity (stopped due to concern could be contributing to diarrhea) Reports recent fasting blood sugars readings:  Morning Fasting Blood Sugar   10 - October 139  11 - October 133  12 - October 130  13 - October 124  14 - October 117  15 - October 122  16 - October 138   Note blood sugar control improved since patient stopped drinking regular soda Have counseled on importance of having regular well-balanced meals, while controlling carbohydrate portion sizes Denies hypoglycemia Have counseled on signs of hypoglycemia and how to manage low  blood sugar Exercise: walks with walker/cane ~10 minutes most days of the week Have discussed importance of fall prevention/consistently using walker Counsel to continue to monitor home blood sugar, keep a log of results, bring this record to medical appointments and call office sooner if needed for readings outside of established parameters or symptoms Remind patient's wife to follow up with [facility] [phone]) when patient in need of further diabetes testing supplies   CHF: Note patient followed by [facility] and the advanced heart failure clinic, including remotely with cardioMEMS Current treatment: Torsemide 20 mg - 3 tablets (60 mg) twice daily Jardiance 10 mg daily Losartan 25 mg -  tablet (12.5 mg) daily Today patient and wife confirm patient increased torsemide dose to 80 mg QAM and 60 mg QPM as directed by Cardiologist.  Report patient's weight today was 191 lbs Review with patient/spouse direction from Cardiologist for when "his weight drops below 195 pounds, at which time he can return to 60 mg BID"  and "He should increase the morning dose back to 80 mg if his weight rises above 195 pounds again" Patient and wife verbalize understanding and state patient will restart taking torsemide 20 mg - 3 tablets (60 mg) BID as weight currently <195 lbs, but will continue to monitor daily weight and adjust per direction from Cardiology if needed Note Cardiology note from 12/13/2021 indicates patient currently taking and to continue metoprolol succinate. However, from review of chart, note metoprolol previously discontinued by Cardiology due to side effects (nightmares). [name] confirms patient NOT currently taking metoprolol Place a call to [facility] for confirmation regarding metoprolol. Leave message with April requesting call back. Encourage patient to continue to weigh daily and keep log and continue to use cardioMEMS monitoring  Medication Assistance: Remind patient/wife to follow up with [employer] patient assistance program/[facility] as needed for refills of [place] Will collaborate with [place] for aid to patient with applying for re-enrollment in [place] patient assistance program  Patient Goals/Self-Care Activities Over the next 90 days, patient will:  Check blood sugar, blood pressure and fluid status as directed by providers  Take medications, with assistance of wife, as directed Mrs. Kloss manages patient's medications using weekly pillbox as adherence tool Attends scheduled medical appointments  Follow Up Plan: Telephone follow up appointment with care management team member scheduled for: 01/28/2022 at 10 am       [name], PharmD, [place], [facility] [phone]

## 2021-12-31 NOTE — Telephone Encounter (Signed)
Pt c/o medication issue:  1. Name of Medication: metoprolol succinate (TOPROL-XL) 25 MG 24 hr tablet  2. How are you currently taking this medication (dosage and times per day)?  3. Are you having a reaction (difficulty breathing--STAT)?   4. What is your medication issue? [place], is calling to get clarification on this medication. She states that notes state that patient is currently taking this medication, but she says that it also seems to have been discontinued several visits ago due to nightmare given type side effect. She is requesting call back to clarify this so she can update his med list. Please advise.

## 2021-12-31 NOTE — Telephone Encounter (Signed)
Please have Lucas Caldwell continue his current medications and bring ALL of his medication bottles with him to his next visit with [place] so that we can appropriately reconcile his medications.  [name], MD [facility]

## 2022-01-01 NOTE — Telephone Encounter (Signed)
I called and spoke with Mrs. Boman (ok per DPR).  I advised her that in reviewing the patient's chart, the most recent recommendations for his torsemide are from his 12/13/21 office visit with Dr. Saunders Revel:  If weight is greater than 195 on your scale at home please take '80mg'$  of torsemide in the AM and '60mg'$  of torsemide in the PM until your weight is below 195lb.    When your weight is below 195lb you can go back to taken torsemide '60mg'$  twice a day.    The patient's wife performed a verbal read back of these recommendations, voices understanding, and is agreeable.   They will follow up with [name], NP on 01/23/22 and are advised to bring all medication bottles to that visit.

## 2022-01-01 NOTE — Telephone Encounter (Signed)
I spoke with the patient's wife, ok per DPR, and advised her to have the patient bring all of his current medication bottles with him to the office when he comes in on 01/23/22 at 11:20 am to see [name], NP.   Mrs. Santacroce voices understanding and is agreeable.

## 2022-01-09 ENCOUNTER — Telehealth (HOSPITAL_COMMUNITY): Payer: Self-pay | Admitting: Adult Health

## 2022-01-09 ENCOUNTER — Telehealth: Payer: Self-pay | Admitting: Pharmacy Technician

## 2022-01-09 DIAGNOSIS — Z596 Low income: Secondary | ICD-10-CM

## 2022-01-09 LAB — SUSCEPTIBILITY, AER + ANAEROB

## 2022-01-09 NOTE — Telephone Encounter (Signed)
   Cardiomems Remote Monitoring  S/P Cardiomems Implant   PAD Goal: 15 Most recent reading: 26  Recommended changes: Please call. Reading suggestive of fluid accumulation.   Instruct to take take an extra 40 mg torsemide for 2 days   I continue to review and analyze the patients PA pressures weekly (and more often as needed) to bring PA pressures within the optimal range.    Juliauna Stueve NP_C   11:37 AM

## 2022-01-09 NOTE — Progress Notes (Signed)
[facility])                                            [place] Team    01/09/2022  DACOTAH CABELLO Oct 27, 1933 [id number]                                      Medication Assistance Referral-For 2024 Re enrollment  Referral From: [place]: Nancee Liter / [employer] Patient application portion:  Mailed Provider application portion: Faxed  to Dr. Parks Ranger Provider address/fax verified via: Office website  Joseth Weigel P. Antoinett Dorman, [place]  [phone]

## 2022-01-10 NOTE — Telephone Encounter (Signed)
Spoke with pts wife she is aware and will add extra '40mg'$  of torsemide for two days.

## 2022-01-12 ENCOUNTER — Other Ambulatory Visit: Payer: Self-pay | Admitting: Family Medicine

## 2022-01-12 DIAGNOSIS — E039 Hypothyroidism, unspecified: Secondary | ICD-10-CM

## 2022-01-12 DIAGNOSIS — E1121 Type 2 diabetes mellitus with diabetic nephropathy: Secondary | ICD-10-CM

## 2022-01-14 ENCOUNTER — Telehealth (HOSPITAL_COMMUNITY): Payer: Self-pay | Admitting: Adult Health

## 2022-01-14 ENCOUNTER — Ambulatory Visit: Payer: Medicare HMO | Admitting: Pharmacist

## 2022-01-14 DIAGNOSIS — I5022 Chronic systolic (congestive) heart failure: Secondary | ICD-10-CM

## 2022-01-14 DIAGNOSIS — Z794 Long term (current) use of insulin: Secondary | ICD-10-CM

## 2022-01-14 NOTE — Patient Instructions (Signed)
Visit Information  Thank you for taking time to visit with me today. Please don't hesitate to contact me if I can be of assistance to you before our next scheduled telephone appointment.  Following are the goals we discussed today:   Goals Addressed             This Visit's Progress    Pharmacy Goals       Please check your blood sugar, keep a log of the results and bring this with you to your medical appointments.  Please check your home blood pressure, keep a log of the results and bring this with you to your medical appointments.  Our goal bad cholesterol, or LDL, is less than 70 . This is why it is important to continue taking your rosuvastatin.  Feel free to call me with any questions or concerns. I look forward to our next call!   [name], PharmD, [place], CPP Clinical Pharmacist [facility] [phone]          Our next appointment is by telephone on 02/22/2022 at 10 am  Please call the care guide team at [phone] if you need to cancel or reschedule your appointment.    The patient verbalized understanding of instructions, educational materials, and care plan provided today and DECLINED offer to receive copy of patient instructions, educational materials, and care plan.

## 2022-01-14 NOTE — Chronic Care Management (AMB) (Signed)
Chronic Care Management CCM Pharmacy Note  01/14/2022 Name:  XIOMAR CROMPTON MRN:  [id number] DOB:  1933-04-25   Subjective: PEARCE LITTLEFIELD is an 88 y.o. year old male who is a primary patient of [name], DO.  The CCM team was consulted for assistance with disease management and care coordination needs.    Receive a voicemail from patient's wife requesting a call back.  Engaged with patient/caregiver by telephone for follow up visit for pharmacy case management and/or care coordination services.   Objective:  Medications Reviewed Today     Reviewed by Rennis Petty, RPH-CPP (Pharmacist) on 12/31/21 at [place] List Status: <None>   Medication Order Taking? Sig Documenting Provider Last Dose Status Informant  acetaminophen (TYLENOL) 500 MG tablet [id number]  Take 1,000 mg by mouth every 6 (six) hours as needed for moderate pain or headache.  [provider]  Active Spouse/Significant Other  Alcohol Swabs (B-D SINGLE USE SWABS REGULAR) PADS [id number]  Use to check blood sugar up to 2 times daily [name], DO  Active Spouse/Significant Other  allopurinol (ZYLOPRIM) 100 MG tablet [id number]  Take 1 tablet (100 mg total) by mouth daily. Theora Gianotti, NP  Active   aspirin 81 MG chewable tablet [id number]  Chew 81 mg by mouth daily. [provider]  Active Spouse/Significant Other  Blood Glucose Calibration (OT ULTRA/FASTTK CNTRL SOLN) SOLN [id number]   [provider]  Active   Blood Glucose Monitoring Suppl (ONE TOUCH ULTRA 2) w/Device KIT [id number]  SMARTSIG:Via Meter [provider]  Active   bosutinib (BOSULIF) 100 MG tablet [id number]  Take 100 mg by mouth at bedtime. Take with food. [provider]  Active Spouse/Significant Other           Med Note Jeanie Cooks Sep 15, 2019  8:38 AM)    Cholecalciferol 25 MCG (1000 UT) capsule [id number]  Take 1,000 Units by mouth daily.  [provider]  Active Spouse/Significant Other  famotidine (PEPCID) 40 MG tablet [id number]  Take 1 tablet (40 mg total) by mouth at bedtime. Karamalegos, [name], DO  Active   gabapentin (NEURONTIN) 600 MG tablet [id number] Yes Take 1 tablet (600 mg total) by mouth 2 (two) times daily. [name], DO Taking Active   glucose blood [facility] VERIO) test strip [id number]  USE TO TEST BLOOD SUGAR EVERY MORNING AND EVERY NIGHT AT BEDTIME [name], DO  Active   Insulin Glargine ([name] KWIKPEN) 100 UNIT/ML [id number]  Inject 22 Units into the skin daily. [provider]  Active   Insulin Pen Needle 31G X 5 MM MISC [id number]  Use to inject insulin daily. Karamalegos, [name], DO  Active   isosorbide mononitrate (IMDUR) 60 MG 24 hr tablet [id number]  Take 0.5 tablets (30 mg total) by mouth daily. End, [name], MD  Active   JARDIANCE 10 MG TABS tablet [id number] Yes Take 1 tablet (10 mg total) by mouth daily with breakfast. [name], DO Taking Active   Lancets (ONETOUCH DELICA PLUS TMHDQQ22L) [place] [id number]  Use to check blood sugar twice daily [name], DO  Active   levothyroxine (SYNTHROID) 25 MCG tablet [id number]  TAKE ONE TABLET BY MOUTH EVERY MORNING BEFORE BREAKFAST [name], DO  Active   losartan (COZAAR) 25 MG tablet [id number] Yes TAKE 1/2 TABLET(12.5 MG) BY MOUTH DAILY End, [name], MD Taking Active   Menthol-Zinc Oxide (GOLD [place]) [id number]  Apply 1 application topically daily as needed (muscle pain). [provider]  Active Spouse/Significant Other  metoprolol succinate (TOPROL-XL) 25 MG 24 hr tablet [id number] No Take 1 tablet by mouth daily.  Patient not taking: Reported on 12/31/2021   [provider] Not Taking Active   mirabegron ER (MYRBETRIQ) 25 MG TB24 tablet [id number]  Take 1 tablet (25 mg total) by mouth daily. Debroah Loop, PA-C  Active   Multiple  Vitamin (MULTIVITAMIN WITH MINERALS) TABS tablet [id number]  Take 1 tablet by mouth daily. [provider]  Active Spouse/Significant Other  nitroGLYCERIN (NITROSTAT) 0.4 MG SL tablet [id number]  For chest pain, tightness, or pressure. While sitting, place 1 tablet under tongue. May be used every 5 minutes as needed, for up to 15 minutes. Do not use more than 3 tablets. End, [name], MD  Active   Polyethyl Glycol-Propyl Glycol [facility]) [id number]  Place 1 drop into both eyes 3 (three) times daily as needed (dry/irritated eyes.).  [provider]  Active Spouse/Significant Other  polyethylene glycol (MIRALAX / GLYCOLAX) 17 g packet [id number]  Take 17 g by mouth daily as needed for mild constipation.  [provider]  Active Spouse/Significant Other  ranolazine (RANEXA) 500 MG 12 hr tablet [id number]  TAKE 1 TABLET BY MOUTH TWICE DAILY. PLEASE KEEP UPCOMING APPOINTMENT FOR FUTURE REFILLS End, [name], MD  Active   rosuvastatin (CRESTOR) 5 MG tablet [id number]  Take 1 tablet (5 mg total) by mouth daily. End, [name], MD  Active   tamsulosin (FLOMAX) 0.4 MG CAPS capsule [id number]  Take 1 capsule (0.4 mg total) by mouth daily after supper. Rise Mu, PA-C  Active Spouse/Significant Other  torsemide (DEMADEX) 20 MG tablet [id number] Yes Take 3 tablets (60 mg total) by mouth 2 (two) times daily. Darrick Grinder D, NP Taking Active   traMADol (ULTRAM) 50 MG tablet [id number]  Take 1-2 tablets (50-100 mg total) by mouth at bedtime as needed. [name], DO  Active Spouse/Significant Other  warfarin (COUMADIN) 3 MG tablet [id number]  TAKE 1 TO 1 & 1/2 TABLETS BY MOUTH AS DIRECTED BY THE COUMADIN CLINIC Strength: 3 mg End, [name], MD  Active             Pertinent Labs:  Lab Results  Component Value Date   HGBA1C 8.0 (H) 12/05/2021    Lab Results  Component Value Date   CREATININE 1.73 (H) 12/11/2021   BUN 32 (H) 12/11/2021   NA 140 12/11/2021   K  3.8 12/11/2021   CL 101 12/11/2021   CO2 29 12/11/2021    SDOH:  (Social Determinants of Health) assessments and interventions performed:  SDOH Interventions    [place] Coordination from 11/05/2021 in [place] Coordination Office Visit from 10/05/2021 in [place] from 09/03/2021 in [place] Management from 10/02/2020 in [facility] Office Visit from 09/08/2019 in [place] Management from 06/10/2019 in [place]  SDOH Interventions        Food Insecurity Interventions Intervention Not Indicated -- Intervention Not Indicated -- -- --  Housing Interventions Intervention Not Indicated -- Intervention Not Indicated -- -- --  Transportation Interventions Intervention Not Indicated -- Intervention Not Indicated -- -- --  Depression Interventions/Treatment  -- Counseling -- -- Counseling --  Financial Strain Interventions Intervention Not Indicated -- Intervention Not Indicated -- -- --  Physical Activity Interventions Other (Comments)  [  gets really short of breath due to chronic conditions] -- -- Other (Comments)  [gets easily short of breath. Limited to the activity he can do] -- Other (Comments)  [the patient is limited in ability to do activity or excercise due to shob and decreased mobility]  Stress Interventions -- -- Intervention Not Indicated -- -- --  Social Connections Interventions -- -- Intervention Not Indicated -- -- --       CCM Care Plan  Review of patient past medical history, allergies, medications, health status, including review of consultants reports, laboratory and other test data, was performed as part of comprehensive evaluation and provision of chronic care management services.   Care Plan : PharmD - Medication Management/Assistance  Updates made by Rennis Petty, RPH-CPP since 01/14/2022 12:00 AM      Problem: Disease Progression      Long-Range Goal: Disease Progression Prevented or Minimized   Start Date: 04/14/2020  Expected End Date: 07/13/2020  Recent Progress: On track  Priority: High  Note:   Current Barriers:  Chronic Disease Management support, education, and care coordination needs related to type 2 diabetes, CKD, HFrEF s/p CardioMEMS, hyperlipidemia, atrial fibrillation, hypertension, coronary artery disease, CHF, hypothyroidism, and CML Financial Note patient has Partial Extra Help/LIS Subsidy Patient enrolled in [employer] patient assistance program for 2023 calendar year Patient denied for patient assistance for Jaridance from [facility] for 2022 calendar year Patient previously enrolled in [place] for [employer] copayment assistance, but eligibility ended as not in use ($0 copayment through Part D plan coverage for Jardiance) Limited vision  Pharmacist Clinical Goal(s):  Over the next 90 days, patient will verbalize ability to afford treatment regimen through collaboration with PharmD and provider.   Interventions: 1:1 collaboration with [name], DO regarding development and update of comprehensive plan of care as evidenced by provider attestation and co-signature Inter-disciplinary care team collaboration (see longitudinal plan of care) Today receive a voicemail from patient's wife requesting a call back Return call to Mrs. Filter  Medication Assistance: Today address patient/spouse's questions regarding [employer] patient assistance program application Spouse states that she/patient will complete application sections as provided by [facility] C[facility] and mail application back to her today.  Patient Goals/Self-Care Activities Over the next 90 days, patient will:  Check blood sugar, blood pressure and fluid status as directed by providers  Take medications, with assistance of wife, as directed Mrs. Steller manages patient's  medications using weekly pillbox as adherence tool Attends scheduled medical appointments        Plan: Telephone follow up appointment with care management team member scheduled for:  02/22/2022 at 49 am  [name], PharmD, [place], [facility] [phone]

## 2022-01-14 NOTE — Telephone Encounter (Signed)
  Cardiomems Remote Monitoring  S/P Cardiomems Implant   PAD Goal: 15 Most recent reading: 23. Suggestive of fluid accumulation   Recommended changes: Increase torsemide to 80 mg twice a day. Set up for BMET .Please call.   I continue to review and analyze the patients PA pressures weekly (and more often as needed) to bring PA pressures within the optimal range.     Peni Rupard NP-C   3:27 PM

## 2022-01-14 NOTE — Telephone Encounter (Signed)
Requested Prescriptions  Pending Prescriptions Disp Refills  . levothyroxine (SYNTHROID) 25 MCG tablet [Pharmacy Med Name: LEVOTHYROXINE 0.025MG (25MCG) TAB] 90 tablet 0    Sig: TAKE 1 TABLET BY MOUTH EVERY MORNING BEFORE BREAKFAST     Endocrinology:  Hypothyroid Agents Passed - 01/12/2022  2:38 PM      Passed - TSH in normal range and within 360 days    TSH  Date Value Ref Range Status  12/05/2021 2.69 0.40 - 4.50 mIU/L Final         Passed - Valid encounter within last 12 months    Recent Outpatient Visits          1 month ago Annual physical exam   [facility] [place], [name], DO   2 months ago Acute pain of left shoulder   [place], PA-C   3 months ago Controlled type 2 diabetes mellitus with diabetic nephropathy, with long-term current use of insulin [facility])   [place], DO   6 months ago Controlled type 2 diabetes mellitus with diabetic nephropathy, with long-term current use of insulin [facility])   [place], DO   7 months ago Diarrhea, unspecified type   [place], [name], DO      Future Appointments            In 1 week Sharolyn Douglas, Clance Boll, NP [place]. [place]   In 1 month Sharolyn Douglas, Clance Boll, NP [facility] Dept Of [place]. [place]   In 4 months [place], [name], DO [facility], [place]           . JARDIANCE 10 MG TABS tablet [Pharmacy Med Name: JARDIANCE 10MG TABLETS] 90 tablet 0    Sig: TAKE 1 TABLET(10 MG) BY MOUTH DAILY WITH BREAKFAST     Endocrinology:  Diabetes - SGLT2 Inhibitors Failed - 01/12/2022  2:38 PM      Failed - Cr in normal range and within 360 days    Creat  Date Value Ref Range Status  12/05/2021 1.96 (H) 0.70 - 1.22 mg/dL Final   Creatinine, Ser  Date Value Ref  Range Status  12/11/2021 1.73 (H) 0.61 - 1.24 mg/dL Final   Creatinine, Urine  Date Value Ref Range Status  12/18/2016 96 mg/dL Final         Failed - HBA1C is between 0 and 7.9 and within 180 days    Hgb A1c MFr Bld  Date Value Ref Range Status  12/05/2021 8.0 (H) <5.7 % of total Hgb Final    Comment:    For someone without known diabetes, a hemoglobin A1c value of 6.5% or greater indicates that they may have  diabetes and this should be confirmed with a follow-up  test. . For someone with known diabetes, a value <7% indicates  that their diabetes is well controlled and a value  greater than or equal to 7% indicates suboptimal  control. A1c targets should be individualized based on  duration of diabetes, age, comorbid conditions, and  other considerations. . Currently, no consensus exists regarding use of hemoglobin A1c for diagnosis of diabetes for children. .          Failed - eGFR in normal range and within 360 days    GFR, Est African American  Date Value Ref  Range Status  04/13/2020 41 (L) > OR = 60 mL/min/1.80m Final   GFR, Est Non African American  Date Value Ref Range Status  04/13/2020 36 (L) > OR = 60 mL/min/1.746mFinal   GFR, Estimated  Date Value Ref Range Status  12/11/2021 38 (L) >60 mL/min Final    Comment:    (NOTE) Calculated using the CKD-EPI Creatinine Equation (2021)    eGFR  Date Value Ref Range Status  12/05/2021 33 (L) > OR = 60 mL/min/1.7366minal  05/16/2021 55 (L) >59 mL/min/1.73 Final         Passed - Valid encounter within last 6 months    Recent Outpatient Visits          1 month ago Annual physical exam   [facility]   2 months ago Acute pain of left shoulder   [place]   3 months ago Controlled type 2 diabetes mellitus with diabetic nephropathy, with long-term current use of insulin [facility]   6 months ago Controlled type 2 diabetes mellitus with diabetic nephropathy, with long-term current use of [facility] [place]   7 months ago Diarrhea, unspecified type   [place]      Future Appointments            In 1 week BerSharolyn DouglashrClance BollP ConHerculesonBeecherIn 1 month BerSharolyn DouglashrClance BollP [facility]. ConHorseshoe BendIn 4 months KarParks RangerleDevonne DoughtyO SouSe[facility]

## 2022-01-14 NOTE — Telephone Encounter (Signed)
Refilled 10/3/02023 #[id number] rf. Requested Prescriptions  Pending Prescriptions Disp Refills  . levothyroxine (SYNTHROID) 25 MCG tablet [Pharmacy Med Name: LEVOTHYROXINE 0.'025MG'$  (25MCG) TAB] 90 tablet 1    Sig: TAKE 1 TABLET BY MOUTH EVERY MORNING BEFORE BREAKFAST     Endocrinology:  Hypothyroid Agents Passed - 01/12/2022  2:38 PM      Passed - TSH in normal range and within 360 days    TSH  Date Value Ref Range Status  12/05/2021 2.69 0.40 - 4.50 mIU/L Final         Passed - Valid encounter within last 12 months    Recent Outpatient Visits          1 month ago Annual physical exam   [facility] [place], [name], DO   2 months ago Acute pain of left shoulder   [place], PA-C   3 months ago Controlled type 2 diabetes mellitus with diabetic nephropathy, with long-term current use of insulin [facility])   [place], DO   6 months ago Controlled type 2 diabetes mellitus with diabetic nephropathy, with long-term current use of insulin [facility])   [place], DO   7 months ago Diarrhea, unspecified type   [place], [name], DO      Future Appointments            In 1 week Sharolyn Douglas, Clance Boll, NP [place]. [place]   In 1 month Sharolyn Douglas, Clance Boll, NP [facility] Dept Of [place]. [place]   In 4 months [place], [name], DO [facility], [facility]

## 2022-01-15 DIAGNOSIS — E1159 Type 2 diabetes mellitus with other circulatory complications: Secondary | ICD-10-CM

## 2022-01-15 DIAGNOSIS — Z794 Long term (current) use of insulin: Secondary | ICD-10-CM | POA: Diagnosis not present

## 2022-01-15 DIAGNOSIS — I5030 Unspecified diastolic (congestive) heart failure: Secondary | ICD-10-CM | POA: Diagnosis not present

## 2022-01-15 MED ORDER — TORSEMIDE 20 MG PO TABS: 80.0000 mg | ORAL_TABLET | Freq: Two times a day (BID) | ORAL | 3 refills | Status: DC

## 2022-01-15 NOTE — Telephone Encounter (Signed)
Pt aware via wife will repeat labs a cardiology follow up 11/8

## 2022-01-15 NOTE — Addendum Note (Signed)
Addended by: Addam Goeller, Sharlot Gowda on: 01/15/2022 04:25 PM   Modules accepted: Orders

## 2022-01-16 ENCOUNTER — Telehealth: Payer: Self-pay | Admitting: Internal Medicine

## 2022-01-16 ENCOUNTER — Other Ambulatory Visit (HOSPITAL_COMMUNITY): Payer: Self-pay | Admitting: Adult Health

## 2022-01-16 ENCOUNTER — Telehealth (HOSPITAL_COMMUNITY): Payer: Self-pay | Admitting: *Deleted

## 2022-01-16 DIAGNOSIS — Z79899 Other long term (current) drug therapy: Secondary | ICD-10-CM

## 2022-01-16 DIAGNOSIS — I5022 Chronic systolic (congestive) heart failure: Secondary | ICD-10-CM

## 2022-01-16 NOTE — Telephone Encounter (Signed)
Attempted to call patients spouse. Phone rings out then I received a message stating to enter remote access code. Unable to leave a VM.

## 2022-01-16 NOTE — Telephone Encounter (Signed)
Pts wife left vm for clarification on torsemide dose I called back no answer.

## 2022-01-16 NOTE — Telephone Encounter (Signed)
  Pt c/o medication issue:  1. Name of Medication: torsemide (DEMADEX) 20 MG tablet  2. How are you currently taking this medication (dosage and times per day)? Take 4 tablets (80 mg total) by mouth 2 (two) times daily.  3. Are you having a reaction (difficulty breathing--STAT)? No   4. What is your medication issue? Pt's wife called, she said, pt's other doctor increased his fluid pills to take 4 in the morning and 4 in the afternoon, she wants to ask Dr. Saunders Revel if that ok to do

## 2022-01-16 NOTE — Telephone Encounter (Signed)
I agree with recommendations from the heart failure clinic to increase torsemide to 80 mg twice daily based on recent CardioMEMS readings.  [name], MD [facility]

## 2022-01-17 NOTE — Telephone Encounter (Signed)
I called and spoke with [name] and advised her of Dr. Darnelle Bos response that he is ok with recommendations from the CHF clinic to increase torsemide to 80 mg BID based on CardioMEMS reading.  [name] voices understanding and advised the patient has been taking the increased dose of torsemide at 80 mg BID since they were called with these recommendations on 01/14/22.  [name] did advise that the patient was told to come for lab work and wanted to know if he could have this done at [facility] when he comes for his echo tomorrow.  I advised I will change the order for him to have  BMP for Lucas Grinder, NP in [facility] tomorrow as there was no time frame mentioned to when to repeat the lab.  Per Amy's phone note from 01/14/22:  Lucas [place], NP     01/14/22  3:27 PM Note   Cardiomems Remote Monitoring   S/P Cardiomems Implant    PAD Goal: 15 Most recent reading: 23. Suggestive of fluid accumulation    Recommended changes: Increase torsemide to 80 mg twice a day. Set up for BMET .Please call.    I continue to review and analyze the patients PA pressures weekly (and more often as needed) to bring PA pressures within the optimal range.       Amy Clegg NP-C    3:27 PM        [name] again voices understanding of all of the above and is agreeable.    BMP ordered reentered to "Lab collect"

## 2022-01-18 ENCOUNTER — Ambulatory Visit: Payer: Medicare HMO | Attending: Internal Medicine

## 2022-01-18 ENCOUNTER — Other Ambulatory Visit
Admission: RE | Admit: 2022-01-18 | Discharge: 2022-01-18 | Disposition: A | Discharge status: Home / Self Care | Payer: Medicare HMO | Attending: Adult Health | Admitting: Adult Health

## 2022-01-18 DIAGNOSIS — I5022 Chronic systolic (congestive) heart failure: Secondary | ICD-10-CM | POA: Insufficient documentation

## 2022-01-18 DIAGNOSIS — Z79899 Other long term (current) drug therapy: Secondary | ICD-10-CM | POA: Insufficient documentation

## 2022-01-18 LAB — ECHOCARDIOGRAM COMPLETE
AR max vel: 5.64 cm2
AV Area VTI: 5.12 cm2
AV Area mean vel: 5.99 cm2
AV Mean grad: 1 mmHg
AV Peak grad: 2.2 mmHg
Ao pk vel: 0.74 m/s
Area-P 1/2: 7.82 cm2
Calc EF: 36.3 %
S' Lateral: 4.15 cm
Single Plane A2C EF: 37.6 %
Single Plane A4C EF: 35.9 %

## 2022-01-18 LAB — BASIC METABOLIC PANEL
Anion gap: 11 (ref 5–15)
BUN: 41 mg/dL — ABNORMAL HIGH (ref 8–23)
CO2: 26 mmol/L (ref 22–32)
Calcium: 9.5 mg/dL (ref 8.9–10.3)
Chloride: 102 mmol/L (ref 98–111)
Creatinine, Ser: 1.99 mg/dL — ABNORMAL HIGH (ref 0.61–1.24)
GFR, Estimated: 32 mL/min — ABNORMAL LOW (ref >60)
Glucose, Bld: 210 mg/dL — ABNORMAL HIGH (ref 70–99)
Potassium: 4.2 mmol/L (ref 3.5–5.1)
Sodium: 139 mmol/L (ref 135–145)

## 2022-01-23 ENCOUNTER — Telehealth: Payer: Self-pay | Admitting: Pharmacy Technician

## 2022-01-23 ENCOUNTER — Encounter: Payer: Self-pay | Admitting: Internal Medicine

## 2022-01-23 ENCOUNTER — Ambulatory Visit (HOSPITAL_BASED_OUTPATIENT_CLINIC_OR_DEPARTMENT_OTHER): Payer: Medicare HMO | Admitting: Internal Medicine

## 2022-01-23 ENCOUNTER — Ambulatory Visit: Payer: Medicare HMO | Admitting: Nurse Practitioner

## 2022-01-23 ENCOUNTER — Other Ambulatory Visit
Admission: RE | Admit: 2022-01-23 | Discharge: 2022-01-23 | Disposition: A | Discharge status: Home / Self Care | Payer: Medicare HMO | Source: Ambulatory Visit | Attending: Internal Medicine | Admitting: Internal Medicine

## 2022-01-23 VITALS — BP 104/58 | HR 81 | Resp 14 | Ht 64.0 in | Wt 196.4 lb

## 2022-01-23 DIAGNOSIS — I2510 Atherosclerotic heart disease of native coronary artery without angina pectoris: Secondary | ICD-10-CM

## 2022-01-23 DIAGNOSIS — Z596 Low income: Secondary | ICD-10-CM

## 2022-01-23 DIAGNOSIS — N1832 Chronic kidney disease, stage 3b: Secondary | ICD-10-CM

## 2022-01-23 DIAGNOSIS — I5022 Chronic systolic (congestive) heart failure: Secondary | ICD-10-CM

## 2022-01-23 DIAGNOSIS — I5023 Acute on chronic systolic (congestive) heart failure: Secondary | ICD-10-CM | POA: Diagnosis not present

## 2022-01-23 LAB — BASIC METABOLIC PANEL
Anion gap: 9 (ref 5–15)
BUN: 29 mg/dL — ABNORMAL HIGH (ref 8–23)
CO2: 25 mmol/L (ref 22–32)
Calcium: 9.1 mg/dL (ref 8.9–10.3)
Chloride: 104 mmol/L (ref 98–111)
Creatinine, Ser: 1.69 mg/dL — ABNORMAL HIGH (ref 0.61–1.24)
GFR, Estimated: 39 mL/min — ABNORMAL LOW (ref >60)
Glucose, Bld: 166 mg/dL — ABNORMAL HIGH (ref 70–99)
Potassium: 4.1 mmol/L (ref 3.5–5.1)
Sodium: 138 mmol/L (ref 135–145)

## 2022-01-23 LAB — BRAIN NATRIURETIC PEPTIDE: B Natriuretic Peptide: 164.6 pg/mL — ABNORMAL HIGH (ref 0.0–100.0)

## 2022-01-23 MED ORDER — METOLAZONE 2.5 MG PO TABS: 2.5000 mg | ORAL_TABLET | ORAL | 3 refills | Status: DC

## 2022-01-23 MED ORDER — POTASSIUM CHLORIDE CRYS ER 20 MEQ PO TBCR: 40.0000 meq | EXTENDED_RELEASE_TABLET | ORAL | 3 refills | Status: DC

## 2022-01-23 MED ORDER — TORSEMIDE 20 MG PO TABS: 60.0000 mg | ORAL_TABLET | Freq: Two times a day (BID) | ORAL | 3 refills | Status: DC

## 2022-01-23 NOTE — Progress Notes (Signed)
[facility])                                            [place] Team    01/23/2022  TAVORIS BRISK 1933-06-15 [id number]  Received patient and provider portion(s) of patient assistance application(s) for [name]. Faxed completed application and required documents into [employer].    Shelie Lansing P. Carlos Heber, [place]  [phone]

## 2022-01-23 NOTE — Patient Instructions (Addendum)
Medication Changes:  DECREASE torsemide to 60 mg twice a day   BEGIN taking metolazone 2.5 mg every Wednesday  BEGIN taking potassium 40 meq every Wednesday with metolazone  Lab Work:  Labs done today, your results will be available in MyChart, we will contact you for abnormal readings.   Testing/Procedures:  None  Referrals:  None  Special Instructions // Education:  Do the following things EVERYDAY: Weigh yourself in the morning before breakfast. Write it down and keep it in a log. Take your medicines as prescribed Eat low salt foods--Limit salt (sodium) to 2000 mg per day.  Stay as active as you can everyday Limit all fluids for the day to less than 2 liters   Follow-Up in:  2 weeks    If you have any questions or concerns before your next appointment please send [place] a message through [place] or call our office at [phone]

## 2022-01-23 NOTE — Progress Notes (Signed)
ReDS Vest / Clip - 01/23/22 1112       ReDS Vest / Clip   Station Marker A    Ruler Value 29.5    ReDS Value Range High volume overload    ReDS Actual Value 41

## 2022-01-23 NOTE — Progress Notes (Signed)
Advanced Heart Failure Clinic Note   Date:  12/05/2021   ID:  URA HAUSEN, DOB June 14, 1933, MRN [id number]  Location: Home  Provider location: [place] [facility] Type of Visit: Established patient   PCP:  [name], DO  Cardiologist:  [name], MD HF Cardiologist: [name]  Chief Complaint:  Chief Complaint  Patient presents with   Telephone visit   Heart failure follow up   HPI: Lucas Caldwell is a 88 y.o. 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 Saunders Revel for further evaluation of his HF.     Has h/o NICM previously followed by Dr. Carolynn Serve at [facility]. He had good response to medical therapy in 2012 EF was reported to be 50%    Had PCI of his RCA CAD s/p RCA stent x2 08/2016 at [facility].    Echo 6/20 LVEF 35-40%   Cardiac cath 08/2018 70% LM stenosis with abnl FFR of 0.86, though drop in iFR was due to diffuse prox LAD disease and only small gradient involving ostial LM. Imdur was increased.    Admitted 04/13/19 with chest pain and A/C systolic heart failure. Repeat Cath 1/21 LM 60% LAD 70% with +FFR otherwise diffuse non-obstructive CAD -> PCI/DES LAD 1/21. RHC: RA 10 PA 53/30 (40) PCWP 28 CO/CI 4.2/2.1 PA sat 67%.   Echo 04/22/19 EF 30-35%   Referred to [facility] 04/2019 for further management of his systolic HF. Had cardiomems implanted in 7/22   Zio 4/21 SR w/ 1st AVB, avg HR 81 bpm, 1 brief 5 beat run of NSVT, Two runs SVT   Echo 8/22 EF 30-35% RV ok   Echo 1/23 EF 30-35%  Cardiomems was running high last week and torsemide increased to 80 bid. PAD on cardiomems 27 ->-> 15 ->-> 21-> 23 (on 11/7)  Labs on 11/3 Scr 1.73 -> 1.99  Here with his wife. Mild DOE. No orthopnea or PND. Says intake has been stable. Drinks 2 bottles of water per day. Weight unchanged. Complaint with  meds  REDs 41%     Past Medical History:  Diagnosis Date   Arthritis    "probably in his legs" (08/15/2016)   Blind right eye    BPH (benign prostatic hyperplasia)    Breast asymmetry    Left breast is larger, present for several years.   CAD (coronary artery disease)    a. Cath in the late 90's - reportedly ok;  b. 2014 s/p stenting x 2 @ [facility]; c 08/19/16 Cath/PCI with DES -> RCA, plan to treat LM 70% medically. Seen by surgery and felt to be too high risk for CABG; d. 08/2018 Cath: LM 70 (iFR 0.86), LAD 20ost, 40p, 50/46m D1 70, LCX patent stent, OM1 30, RCA patent stent, 445mSR, 40d, RPDA 30. PCWP 8. CO/CI 4.0/2.1; e. 03/2019 PCI to LAD (2.5x15 Resolute Onyx DES).   Chronic combined systolic (congestive) and diastolic (congestive) heart failure ([place]   a. Previously reduced EF-->50% by echo in 2012;  b. 06/2015 Echo: EF 50-55%  c. 07/2016 Echo: EF 45-50%; d. 12/2017 Echo: EF 55%; e. 08/2018 Echo: EF 35-40%; f. 04/2019 Echo: EF 30-35%; g. s/p cardiomems; h. 10/2020 Echo: EF 30-35%, mod-sev glob HK, gr1 DD.   Chronic kidney disease (CKD), stage IV (severe) (HCC)    CML (chronic  myelocytic leukemia) ([place])    GERD (gastroesophageal reflux disease)    GIB (gastrointestinal bleeding)    a. 12/2017 3 unit PRBC GIB in setting of coumadin-->Endo/colon multiple duodenal ulcers and a single bleeding ulcer in the proximal ascending colon status post hemostatic clipping x2.   Gout    Hyperlipemia    Hypertension    Hypothyroidism    Iron deficiency anemia    Ischemic cardiomyopathy    a. Previously reduced EF-->50% by echo in 2012;  b. 06/2015 Echo: EF 50-55%, Gr2 DD;  c. 07/2016 Echo: EF 45-50%; d. 12/2017 Echo: EF 55%, Gr1 DD, mild MR; e. 08/2018 Echo: EF 35-40%; f. 04/2019 Echo: EF 30-35%; g. 10/2020 Echo: EF 30-35%.   Migraine    "in the 1960s" (08/15/2016)   Obstructive sleep apnea    Orthostatic hypotension    a. 20222 -->carvedilol and hydralazine d/c'd.   PAF (paroxysmal atrial fibrillation) (HCC)     a. ? Dx 2014-->s/p DCCV;  b. CHA2DS2VASc = 6--> Coumadin.   Prostate cancer ([place])    RBBB    Type II diabetes mellitus [facility])    Past Surgical History:  Procedure Laterality Date   AMPUTATION TOE Right 02/23/2021   Procedure: AMPUTATION TOE;  Surgeon: Samara Deist, DPM;  Location: [facility] ORS;  Service: Podiatry;  Laterality: Right;   CATARACT EXTRACTION W/ INTRAOCULAR LENS  IMPLANT, BILATERAL Bilateral    COLONOSCOPY N/A 01/13/2018   Procedure: COLONOSCOPY;  Surgeon: Lin Landsman, MD;  Location: [facility] ENDOSCOPY;  Service: Gastroenterology;  Laterality: N/A;   COLONOSCOPY WITH PROPOFOL N/A 01/01/2018   Procedure: COLONOSCOPY WITH PROPOFOL;  Surgeon: Lin Landsman, MD;  Location: [facility] ENDOSCOPY;  Service: Gastroenterology;  Laterality: N/A;   CORONARY ANGIOPLASTY WITH STENT PLACEMENT  09/2012   2 stents   CORONARY STENT INTERVENTION N/A 08/19/2016   Procedure: Coronary Stent Intervention;  Surgeon: [name], MD;  Location: [place] CV LAB;  Service: Cardiovascular;  Laterality: N/A;   CORONARY STENT INTERVENTION N/A 04/13/2019   Procedure: CORONARY STENT INTERVENTION;  Surgeon: [name], MD;  Location: [place] CV LAB;  Service: Cardiovascular;  Laterality: N/A;   ESOPHAGEAL MANOMETRY N/A 12/16/2017   Procedure: ESOPHAGEAL MANOMETRY (EM);  Surgeon: Lin Landsman, MD;  Location: [facility] ENDOSCOPY;  Service: Gastroenterology;  Laterality: N/A;   ESOPHAGOGASTRODUODENOSCOPY N/A 12/20/2016   Procedure: ESOPHAGOGASTRODUODENOSCOPY (EGD);  Surgeon: Lin Landsman, MD;  Location: [facility] ENDOSCOPY;  Service: Gastroenterology;  Laterality: N/A;   ESOPHAGOGASTRODUODENOSCOPY N/A 01/13/2018   Procedure: ESOPHAGOGASTRODUODENOSCOPY (EGD);  Surgeon: Lin Landsman, MD;  Location: [facility] ENDOSCOPY;  Service: Gastroenterology;  Laterality: N/A;   ESOPHAGOGASTRODUODENOSCOPY (EGD) WITH PROPOFOL N/A 11/03/2017   Procedure: ESOPHAGOGASTRODUODENOSCOPY (EGD) WITH PROPOFOL;   Surgeon: Lin Landsman, MD;  Location: [facility] ENDOSCOPY;  Service: Gastroenterology;  Laterality: N/A;   EYE SURGERY     HERNIA REPAIR     "navel"   LEFT HEART CATH AND CORONARY ANGIOGRAPHY N/A 08/14/2016   Procedure: Left Heart Cath and Coronary Angiography;  Surgeon: [name], MD;  Location: [place] CV LAB;  Service: Cardiovascular;  Laterality: N/A;   PRESSURE SENSOR/CARDIOMEMS N/A 09/23/2019   Procedure: PRESSURE SENSOR/CARDIOMEMS;  Surgeon: Jolaine Artist, MD;  Location: [place] CV LAB;  Service: Cardiovascular;  Laterality: N/A;   RIGHT HEART CATH N/A 09/23/2019   Procedure: RIGHT HEART CATH;  Surgeon: Jolaine Artist, MD;  Location: [place] CV LAB;  Service: Cardiovascular;  Laterality: N/A;   RIGHT/LEFT HEART CATH AND CORONARY ANGIOGRAPHY N/A 09/01/2018  Procedure: RIGHT/LEFT HEART CATH AND CORONARY ANGIOGRAPHY;  Surgeon: [name], MD;  Location: [place] CV LAB;  Service: Cardiovascular;  Laterality: N/A;   RIGHT/LEFT HEART CATH AND CORONARY ANGIOGRAPHY N/A 04/13/2019   Procedure: RIGHT/LEFT HEART CATH AND CORONARY ANGIOGRAPHY;  Surgeon: [name], MD;  Location: [place] CV LAB;  Service: Cardiovascular;  Laterality: N/A;   TOE AMPUTATION Right    "big toe"   Current Outpatient Medications  Medication Sig Dispense Refill   acetaminophen (TYLENOL) 500 MG tablet Take 1,000 mg by mouth every 6 (six) hours as needed for moderate pain or headache.      Alcohol Swabs (B-D SINGLE USE SWABS REGULAR) PADS Use to check blood sugar up to 2 times daily 200 each 3   allopurinol (ZYLOPRIM) 100 MG tablet Take 1 tablet (100 mg total) by mouth daily. 90 tablet 3   aspirin 81 MG chewable tablet Chew 81 mg by mouth daily.     Blood Glucose Calibration (OT ULTRA/FASTTK CNTRL SOLN) SOLN      Blood Glucose Monitoring Suppl (ONE TOUCH ULTRA 2) w/Device KIT SMARTSIG:Via Meter     bosutinib (BOSULIF) 100 MG tablet Take 100 mg by mouth at bedtime. Take  with food.     Cholecalciferol 25 MCG (1000 UT) capsule Take 1,000 Units by mouth daily.     famotidine (PEPCID) 40 MG tablet Take 1 tablet (40 mg total) by mouth at bedtime. 90 tablet 0   gabapentin (NEURONTIN) 600 MG tablet Take 600 mg by mouth 2 (two) times daily.     glucose blood (ONETOUCH VERIO) test strip USE TO TEST BLOOD SUGAR EVERY MORNING AND EVERY NIGHT AT BEDTIME 100 strip 0   Insulin Glargine ([name] KWIKPEN) 100 UNIT/ML Inject 22 Units into the skin daily.     Insulin Pen Needle 31G X 5 MM MISC Use to inject insulin daily. 100 each 3   isosorbide mononitrate (IMDUR) 60 MG 24 hr tablet TAKE 1/2 TABLET(30 MG) BY MOUTH DAILY 45 tablet 0   JARDIANCE 10 MG TABS tablet Take 1 tablet (10 mg total) by mouth daily with breakfast. 90 tablet 1   Lancets (ONETOUCH DELICA PLUS [id number]) MISC Use to check blood sugar twice daily 200 each 3   levothyroxine (SYNTHROID) 25 MCG tablet TAKE ONE TABLET BY MOUTH EVERY MORNING BEFORE BREAKFAST 90 tablet 1   losartan (COZAAR) 25 MG tablet TAKE 1/2 TABLET(12.5 MG) BY MOUTH DAILY 45 tablet 1   Menthol-Zinc Oxide (GOLD BOND EX) Apply 1 application topically daily as needed (muscle pain).     metoprolol succinate (TOPROL-XL) 25 MG 24 hr tablet Take 1 tablet by mouth daily.     mirabegron ER (MYRBETRIQ) 25 MG TB24 tablet Take 1 tablet (25 mg total) by mouth daily. 90 tablet 3   Multiple Vitamin (MULTIVITAMIN WITH MINERALS) TABS tablet Take 1 tablet by mouth daily.     nitroGLYCERIN (NITROSTAT) 0.4 MG SL tablet Place 1 tablet (0.4 mg total) under the tongue every 5 (five) minutes x 3 doses as needed for chest pain. 25 tablet 1   Polyethyl Glycol-Propyl Glycol (SYSTANE OP) Place 1 drop into both eyes 3 (three) times daily as needed (dry/irritated eyes.).      polyethylene glycol (MIRALAX / GLYCOLAX) 17 g packet Take 17 g by mouth daily as needed for mild constipation.      ranolazine (RANEXA) 500 MG 12 hr tablet TAKE ONE TABLET BY MOUTH TWICE DAILY 180 tablet  0   rosuvastatin (CRESTOR) 5 MG tablet  Take 1 tablet (5 mg total) by mouth daily. 90 tablet 3   tamsulosin (FLOMAX) 0.4 MG CAPS capsule Take 1 capsule (0.4 mg total) by mouth daily after supper. 30 capsule 11   torsemide (DEMADEX) 20 MG tablet Take 3 tablets (60 mg total) by mouth 2 (two) times daily. 180 tablet 3   traMADol (ULTRAM) 50 MG tablet Take 1-2 tablets (50-100 mg total) by mouth at bedtime as needed. 30 tablet 2   warfarin (COUMADIN) 3 MG tablet TAKE 1 TO 1 & 1/2 TABLETS BY MOUTH AS DIRECTED BY THE COUMADIN CLINIC Strength: 3 mg 135 tablet 0   No current facility-administered medications for this encounter.    Allergies:   Clopidogrel, Ciprofloxacin, Beta adrenergic blockers, Mirtazapine, and Spironolactone   Social History:  The patient  reports that he quit smoking about 48 years ago. His smoking use included cigarettes. He started smoking about 68 years ago. He has a 35.00 pack-year smoking history. He has quit using smokeless tobacco. He reports that he does not drink alcohol and does not use drugs.   Family History:  The patient's family history includes Brain cancer in his father; Diabetes in his sister; Heart attack in his sister.   ROS:  Please see the history of present illness.   All other systems are personally reviewed and negative.   Exam:   General:  Obese male Well appearing. No resp difficulty HEENT: normal Neck: supple. JVP 7-8  Carotids 2+ bilat; no bruits. No lymphadenopathy or thryomegaly appreciated. Cor: PMI nondisplaced. Regular rate & rhythm. No rubs, gallops or murmurs. Lungs: clear Abdomen: obese soft, nontender, nondistended. No hepatosplenomegaly. No bruits or masses. Good bowel sounds. Extremities: no cyanosis, clubbing, rash, trace edema Neuro: alert & orientedx3, cranial nerves grossly intact. moves all 4 extremities w/o difficulty. Affect pleasant   Recent Labs: 02/01/2021: B Natriuretic Peptide 41.0 02/22/2021: ALT 10 11/14/2021: BUN 30;  Creatinine, Ser 1.73; Hemoglobin 13.7; Platelets 187; Potassium 3.7; Sodium 138  Personally reviewed   Wt Readings from Last 3 Encounters:  12/05/21 87.5 kg (193 lb)  11/21/21 87.5 kg (193 lb)  11/15/21 89.5 kg (197 lb 6.4 oz)    Pulse (!) 56 Comment: obtained at home  Wt 87.5 kg (193 lb) Comment: obtained at home  SpO2 91% Comment: obtained at home  BMI 33.13 kg/m   ASSESSMENT AND PLAN: 1. Chronic systolic HF: - likely combination of iCM/NICM - Echo (2/21): EF 30-35%  (was 35-40% in 6/20) - RHC (04/13/19): with elevated filling pressures but BNP normal  - Echo (8/22): EF 30-35%, grade I DD, RV low normal - Echo 1/23 EF 30-35% - Stable NYHA III, Volume up on cardiomems and ReDS despite increasing torsemide 60 mg bid -> 80 bid - Will switch torsemide back to 60 bid and add metolazone 2.5 and kcl 40 every Wednesday. 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  - Continue losartan 12.5 mg daily (dose decreased due to low BP).  - Continue Jardiance 10 mg daily . - Labs today.  - F/u 2 weeks - Follow cardiomems daily. If not improving will try Furoscix  2. CAD - Cath 04/13/19 LAD 70% with +FFR otherwise diffuse non-obstructive CAD  - S/p PCI/DES to LAD 1/21 - Followed by Dr. Saunders Revel - No s/s angina  - Continue medical management.  3. CKD Stage 3b - baseline SCr ~1.7-2.0 - labs today - watch closely with metolazone - Avoid hypotension.  4. DM  - Continue Jardiance  10 mg daily. - No GU symptoms  5. PAF - Zio patch 4/21 showed no AF. Mostly NSR w/ brief SVT/ NSVT, PACs and PVCs - No palpitations. - Continue warfarin.    Glori Bickers, MD  10:59 AM   [place] [address] [facility] [place] [address] [phone] (office) [phone] (fax)

## 2022-01-28 ENCOUNTER — Telehealth: Payer: Medicare HMO

## 2022-01-28 ENCOUNTER — Encounter: Payer: Self-pay | Admitting: Internal Medicine

## 2022-01-28 ENCOUNTER — Ambulatory Visit (INDEPENDENT_AMBULATORY_CARE_PROVIDER_SITE_OTHER): Payer: Medicare HMO

## 2022-01-28 DIAGNOSIS — I5022 Chronic systolic (congestive) heart failure: Secondary | ICD-10-CM

## 2022-01-28 DIAGNOSIS — I10 Essential (primary) hypertension: Secondary | ICD-10-CM

## 2022-01-28 DIAGNOSIS — I5032 Chronic diastolic (congestive) heart failure: Secondary | ICD-10-CM

## 2022-01-28 DIAGNOSIS — Z794 Long term (current) use of insulin: Secondary | ICD-10-CM

## 2022-01-28 NOTE — Patient Instructions (Signed)
Please call the care guide team at [phone] if you need to cancel or reschedule your appointment.   If you are experiencing a Mental Health or [place] or need someone to talk to, please call the Suicide and Crisis Lifeline: 988 call the [place] National Suicide Prevention Lifeline: (734) 420-0742 or TTY: 830-334-6004 TTY (530) 535-6689) to talk to a trained counselor call 1-800-273-TALK (toll free, 24 hour hotline)   Following is a copy of your full provider care plan:   Goals Addressed             This Visit's Progress    CCM (DIABETES) EXPECTED OUTCOME: MONITOR, SELF-MANAGE AND REDUCE SYMPTOMS OF DIABETES       Current Barriers:  Knowledge Deficits related to the importance of blood sugars control in the patient with DM and multiple other chronic conditions Care Coordination needs related to ongoing support and education  in a patient with DM Chronic Disease Management support and education needs related to effective management of DM A1C goal of <8.0%  Planned Interventions: Provided education to patient about basic DM disease process; Reviewed medications with patient and discussed importance of medication adherence;        Reviewed prescribed diet with patient heart healthy/ADA; Counseled on importance of regular laboratory monitoring as prescribed;        Discussed plans with patient for ongoing care management follow up and provided patient with direct contact information for care management team;      Provided patient with written educational materials related to hypo and hyperglycemia and importance of correct treatment;       Reviewed scheduled/upcoming provider appointments including: 06-05-2022,  has several with specialist;         Advised patient, providing education and rationale, to check cbg twice daily and when you have symptoms of low or high blood sugar and record. The patients wife gave several readings. The lowest was 134 and the highest was 214. The  patient usually has average over several days of 147. Education and support given.        call provider for findings outside established parameters;       Referral made to pharmacy team for assistance with ongoing support and education for medication needs;       Review of patient status, including review of consultants reports, relevant laboratory and other test results, and medications completed;       Advised patient to discuss changes in DM and other questions and concerns with provider;      Screening for signs and symptoms of depression related to chronic disease state;        Assessed social determinant of health barriers;         Symptom Management: Take medications as prescribed   Attend all scheduled provider appointments Call provider office for new concerns or questions  call the Suicide and Crisis Lifeline: 988 call the [place] National Suicide Prevention Lifeline: 979-090-7531 or TTY: 832-062-2593 TTY 9180831250) to talk to a trained counselor call 1-800-273-TALK (toll free, 24 hour hotline) if experiencing a Mental Health or [place]  check feet daily for cuts, sores or redness trim toenails straight across wash and dry feet carefully every day wear comfortable, cotton socks wear comfortable, well-fitting shoes  Follow Up Plan: Telephone follow up appointment with care management team member scheduled for: 04-01-2022 at 0900 am       COMPLETED: CCM Expected Outcome:  Monitor, Self-Manage and Reduce Symptoms of  Hyperlipidemia  CCM Expected Outcome:  Monitor, Self-Manage and Reduce Symptoms of Heart Failure       Current Barriers:  Knowledge Deficits related to the need to keep weights in balance and to monitor for changes that impact heart failure and cause exacerbations Care Coordination needs related to ongoing support and education needs in a patient with CHF Chronic Disease Management support and education needs related to effective management of  CHF  Planned Interventions: Basic overview and discussion of pathophysiology of Heart Failure reviewed Provided education on low sodium diet. The patient wife states they are really watching his sodium content in foods Reviewed Heart Failure Action Plan in depth and provided written copy Assessed need for readable accurate scales in home Provided education about placing scale on hard, flat surface Advised patient to weigh each morning after emptying bladder Discussed importance of daily weight and advised patient to weigh and record daily Reviewed role of diuretics in prevention of fluid overload and management of heart failure. The patient is working with the cardiologist for trying to figure out the best way to keep fluids and weight balanced. Discussed the importance of keeping all appointments with provider Advised patient to discuss changes in heart failure with provider Screening for signs and symptoms of depression related to chronic disease state  Assessed social determinant of health barriers  Symptom Management: Take medications as prescribed   Attend all scheduled provider appointments Call provider office for new concerns or questions  call the Suicide and Crisis Lifeline: 988 call the [place] National Suicide Prevention Lifeline: (702)408-2555 or TTY: 409-484-8368 TTY (346)519-4381) to talk to a trained counselor call 1-800-273-TALK (toll free, 24 hour hotline) if experiencing a Mental Health or [place]  call office if I gain more than 2 pounds in one day or 5 pounds in one week track weight in diary use salt in moderation watch for swelling in feet, ankles and legs every day weigh myself daily develop a rescue plan follow rescue plan if symptoms flare-up track symptoms and what helps feel better or worse  Follow Up Plan: Telephone follow up appointment with care management team member scheduled for: 04-01-2022 at 09 am       CCM Expected Outcome:   Monitor, Self-Manage, and Reduce Symptoms of Hypertension       Current Barriers:  Chronic Disease Management support and education needs related to effective management of HTN  Planned Interventions: Evaluation of current treatment plan related to hypertension self management and patient's adherence to plan as established by provider;   Provided education to patient re: stroke prevention, s/s of heart attack and stroke; Reviewed prescribed diet heart healthy/ADA diet Reviewed medications with patient and discussed importance of compliance;  Discussed plans with patient for ongoing care management follow up and provided patient with direct contact information for care management team; Advised patient, providing education and rationale, to monitor blood pressure daily and record, calling PCP for findings outside established parameters;  Advised patient to discuss changes in blood pressures and heart health with provider; Provided education on prescribed diet heart healthy/ADA diet ;  Discussed complications of poorly controlled blood pressure such as heart disease, stroke, circulatory complications, vision complications, kidney impairment, sexual dysfunction;  Screening for signs and symptoms of depression related to chronic disease state;  Assessed social determinant of health barriers;   Symptom Management: Take medications as prescribed   Attend all scheduled provider appointments Call provider office for new concerns or questions  call the Suicide and Crisis Lifeline: 988  call the [place] National Suicide Prevention Lifeline: 509-200-1950 or TTY: (725) 159-3327 TTY 9201523433) to talk to a trained counselor call 1-800-273-TALK (toll free, 24 hour hotline) if experiencing a Mental Health or [place]  check blood pressure 3 times per week write blood pressure results in a log or diary learn about high blood pressure keep a blood pressure log take blood pressure log to all  doctor appointments call doctor for signs and symptoms of high blood pressure develop an action plan for high blood pressure keep all doctor appointments take medications for blood pressure exactly as prescribed report new symptoms to your doctor  Follow Up Plan: Telephone follow up appointment with care management team member scheduled for: 04-01-2021 at 0900 am       COMPLETED: RNCM: Effecitve management of DM       Care Coordination Interventions: Closing: See new goal in CCM Lab Results  Component Value Date   HGBA1C 7.8 (H) 07/16/2021     10-28-2021 130  10-29-2021 161  10-30-2021 141  10-31-2021 160  11-04-2021 138  Provided education to patient about basic DM disease process Reviewed medications with patient and discussed importance of medication adherence Counseled on importance of regular laboratory monitoring as prescribed Discussed plans with patient for ongoing care management follow up and provided patient with direct contact information for care management team Provided patient with written educational materials related to hypo and hyperglycemia and importance of correct treatment Reviewed scheduled/upcoming provider appointments including: 12-05-2021 at 3 pm Advised patient, providing education and rationale, to check cbg as directed and record, calling pcp for findings outside established parameters Referral made to pharmacy team for assistance with currently ongoing support and education with the pharm D Review of patient status, including review of consultants reports, relevant laboratory and other test results, and medications completed Review of pcp offering Alpha Lipoic Acid and [place] [employer] legs pm for help with RLS        COMPLETED: RNCM: Effective Management of HF       Care Coordination Interventions:Closing, see new goal in CCM BP Readings from Last 3 Encounters:  10/05/21 131/63  09/26/21 110/60  08/30/21 132/89    Basic overview and discussion of  pathophysiology of Heart Failure reviewed Provided education on low sodium diet. Education and support given. The patients wife helps with management of dietary habits.  Reviewed Heart Failure Action Plan in depth and provided written copy Assessed need for readable accurate scales in home. 11-05-2021: Uses a mat that he lays on that registers his weight and it goes into the center and his diuretic is managed by what his readings are. He has been on extra dose of his diuretic because of this. Education and support given.  Provided education about placing scale on hard, flat surface Advised patient to weigh each morning after emptying bladder Discussed importance of daily weight and advised patient to weigh and record daily Reviewed role of diuretics in prevention of fluid overload and management of heart failure; Discussed the importance of keeping all appointments with provider Advised patient to discuss changes in weight, questions, or concerns with provider Screening for signs and symptoms of depression related to chronic disease state  Assessed social determinant of health barriers  Review of dietary restrictions and staying away from sugary substances           The patient verbalized understanding of instructions, educational materials, and care plan provided today and DECLINED offer to receive copy of patient instructions, educational materials, and  care plan.   Telephone follow up appointment with care management team member scheduled for: 04-01-2022 at 0900 am

## 2022-01-28 NOTE — Chronic Care Management (AMB) (Signed)
Chronic Care Management   CCM RN Visit Note  01/28/2022 Name: Lucas Caldwell MRN: [id number] DOB: 1933-04-05  Subjective: Lucas Caldwell is a 89 y.o. year old male who is a primary care patient of [name], DO. The patient was referred to the Chronic Care Management team for assistance with care management needs subsequent to provider initiation of CCM services and plan of care.    Today's Visit:  Engaged with patient by telephone for initial visit.     SDOH Interventions Today    Flowsheet Row Most Recent Value  SDOH Interventions   Utilities Interventions Intervention Not Indicated         Goals Addressed             This Visit's Progress    CCM (DIABETES) EXPECTED OUTCOME: MONITOR, SELF-MANAGE AND REDUCE SYMPTOMS OF DIABETES       Current Barriers:  Knowledge Deficits related to the importance of blood sugars control in the patient with DM and multiple other chronic conditions Care Coordination needs related to ongoing support and education  in a patient with DM Chronic Disease Management support and education needs related to effective management of DM A1C goal of <8.0%  Planned Interventions: Provided education to patient about basic DM disease process; Reviewed medications with patient and discussed importance of medication adherence;        Reviewed prescribed diet with patient heart healthy/ADA; Counseled on importance of regular laboratory monitoring as prescribed;        Discussed plans with patient for ongoing care management follow up and provided patient with direct contact information for care management team;      Provided patient with written educational materials related to hypo and hyperglycemia and importance of correct treatment;       Reviewed scheduled/upcoming provider appointments including: 06-05-2022,  has several with specialist;         Advised patient, providing education and rationale, to check cbg twice daily and when you  have symptoms of low or high blood sugar and record. The patients wife gave several readings. The lowest was 134 and the highest was 214. The patient usually has average over several days of 147. Education and support given.        call provider for findings outside established parameters;       Referral made to pharmacy team for assistance with ongoing support and education for medication needs;       Review of patient status, including review of consultants reports, relevant laboratory and other test results, and medications completed;       Advised patient to discuss changes in DM and other questions and concerns with provider;      Screening for signs and symptoms of depression related to chronic disease state;        Assessed social determinant of health barriers;         Symptom Management: Take medications as prescribed   Attend all scheduled provider appointments Call provider office for new concerns or questions  call the Suicide and Crisis Lifeline: 988 call the [place] National Suicide Prevention Lifeline: (684)838-8896 or TTY: (215)887-5738 TTY 270-038-1094) to talk to a trained counselor call 1-800-273-TALK (toll free, 24 hour hotline) if experiencing a Mental Health or [place]  check feet daily for cuts, sores or redness trim toenails straight across wash and dry feet carefully every day wear comfortable, cotton socks wear comfortable, well-fitting shoes  Follow Up Plan: Telephone follow up appointment with care  management team member scheduled for: 04-01-2022 at 0900 am       COMPLETED: CCM Expected Outcome:  Monitor, Self-Manage and Reduce Symptoms of  Hyperlipidemia       CCM Expected Outcome:  Monitor, Self-Manage and Reduce Symptoms of Heart Failure       Current Barriers:  Knowledge Deficits related to the need to keep weights in balance and to monitor for changes that impact heart failure and cause exacerbations Care Coordination needs related to ongoing  support and education needs in a patient with CHF Chronic Disease Management support and education needs related to effective management of CHF  Planned Interventions: Basic overview and discussion of pathophysiology of Heart Failure reviewed Provided education on low sodium diet. The patient wife states they are really watching his sodium content in foods Reviewed Heart Failure Action Plan in depth and provided written copy Assessed need for readable accurate scales in home Provided education about placing scale on hard, flat surface Advised patient to weigh each morning after emptying bladder Discussed importance of daily weight and advised patient to weigh and record daily Reviewed role of diuretics in prevention of fluid overload and management of heart failure. The patient is working with the cardiologist for trying to figure out the best way to keep fluids and weight balanced. Discussed the importance of keeping all appointments with provider Advised patient to discuss changes in heart failure with provider Screening for signs and symptoms of depression related to chronic disease state  Assessed social determinant of health barriers  Symptom Management: Take medications as prescribed   Attend all scheduled provider appointments Call provider office for new concerns or questions  call the Suicide and Crisis Lifeline: 988 call the [place] National Suicide Prevention Lifeline: 510-047-1580 or TTY: (301) 844-9403 TTY 269-036-3223) to talk to a trained counselor call 1-800-273-TALK (toll free, 24 hour hotline) if experiencing a Mental Health or [place]  call office if I gain more than 2 pounds in one day or 5 pounds in one week track weight in diary use salt in moderation watch for swelling in feet, ankles and legs every day weigh myself daily develop a rescue plan follow rescue plan if symptoms flare-up track symptoms and what helps feel better or worse  Follow Up  Plan: Telephone follow up appointment with care management team member scheduled for: 04-01-2022 at 09 am       CCM Expected Outcome:  Monitor, Self-Manage, and Reduce Symptoms of Hypertension       Current Barriers:  Chronic Disease Management support and education needs related to effective management of HTN  Planned Interventions: Evaluation of current treatment plan related to hypertension self management and patient's adherence to plan as established by provider;   Provided education to patient re: stroke prevention, s/s of heart attack and stroke; Reviewed prescribed diet heart healthy/ADA diet Reviewed medications with patient and discussed importance of compliance;  Discussed plans with patient for ongoing care management follow up and provided patient with direct contact information for care management team; Advised patient, providing education and rationale, to monitor blood pressure daily and record, calling PCP for findings outside established parameters;  Advised patient to discuss changes in blood pressures and heart health with provider; Provided education on prescribed diet heart healthy/ADA diet ;  Discussed complications of poorly controlled blood pressure such as heart disease, stroke, circulatory complications, vision complications, kidney impairment, sexual dysfunction;  Screening for signs and symptoms of depression related to chronic disease state;  Assessed social determinant  of health barriers;   Symptom Management: Take medications as prescribed   Attend all scheduled provider appointments Call provider office for new concerns or questions  call the Suicide and Crisis Lifeline: 988 call the [place] National Suicide Prevention Lifeline: 8171986150 or TTY: (980)339-1345 TTY 7604190169) to talk to a trained counselor call 1-800-273-TALK (toll free, 24 hour hotline) if experiencing a Mental Health or [place]  check blood pressure 3 times per  week write blood pressure results in a log or diary learn about high blood pressure keep a blood pressure log take blood pressure log to all doctor appointments call doctor for signs and symptoms of high blood pressure develop an action plan for high blood pressure keep all doctor appointments take medications for blood pressure exactly as prescribed report new symptoms to your doctor  Follow Up Plan: Telephone follow up appointment with care management team member scheduled for: 04-01-2021 at 0900 am       COMPLETED: RNCM: Effecitve management of DM       Care Coordination Interventions: Closing: See new goal in CCM Lab Results  Component Value Date   HGBA1C 7.8 (H) 07/16/2021     10-28-2021 130  10-29-2021 161  10-30-2021 141  10-31-2021 160  11-04-2021 138  Provided education to patient about basic DM disease process Reviewed medications with patient and discussed importance of medication adherence Counseled on importance of regular laboratory monitoring as prescribed Discussed plans with patient for ongoing care management follow up and provided patient with direct contact information for care management team Provided patient with written educational materials related to hypo and hyperglycemia and importance of correct treatment Reviewed scheduled/upcoming provider appointments including: 12-05-2021 at 3 pm Advised patient, providing education and rationale, to check cbg as directed and record, calling pcp for findings outside established parameters Referral made to pharmacy team for assistance with currently ongoing support and education with the pharm D Review of patient status, including review of consultants reports, relevant laboratory and other test results, and medications completed Review of pcp offering Alpha Lipoic Acid and [place] [employer] legs pm for help with RLS        COMPLETED: RNCM: Effective Management of HF       Care Coordination Interventions:Closing, see new  goal in CCM BP Readings from Last 3 Encounters:  10/05/21 131/63  09/26/21 110/60  08/30/21 132/89    Basic overview and discussion of pathophysiology of Heart Failure reviewed Provided education on low sodium diet. Education and support given. The patients wife helps with management of dietary habits.  Reviewed Heart Failure Action Plan in depth and provided written copy Assessed need for readable accurate scales in home. 11-05-2021: Uses a mat that he lays on that registers his weight and it goes into the center and his diuretic is managed by what his readings are. He has been on extra dose of his diuretic because of this. Education and support given.  Provided education about placing scale on hard, flat surface Advised patient to weigh each morning after emptying bladder Discussed importance of daily weight and advised patient to weigh and record daily Reviewed role of diuretics in prevention of fluid overload and management of heart failure; Discussed the importance of keeping all appointments with provider Advised patient to discuss changes in weight, questions, or concerns with provider Screening for signs and symptoms of depression related to chronic disease state  Assessed social determinant of health barriers  Review of dietary restrictions and staying away from sugary substances  Plan:Telephone follow up appointment with care management team member scheduled for:  04-01-2022 at 0900 am  [place], MSN, CCM RN Care Manager  Chronic Care Management Direct Number: [phone]

## 2022-01-28 NOTE — Chronic Care Management (AMB) (Signed)
Chronic Care Management Provider Comprehensive Care Plan    01/28/2022 Name: Lucas Caldwell MRN: [id number] DOB: 03/04/1934  Referral to Chronic Care Management (CCM) services was placed by Provider:  Dwana Melena on Date: 12-31-2021.  Chronic Condition 1: HF Provider Assessment and Plan  Relevant Orders     COMPLETE METABOLIC PANEL WITH GFR (Completed)    CBC with Differential/Platelet (Completed)     Expected Outcome/Goals Addressed This Visit (Provider CCM goals/Provider Assessment and plan   CCM (HEART FAILURE)  EXPECTED OUTCOME:  MONITOR, SELF-MANAGE AND REDUCE SYMPTOMS OF HEART FAILURE  Symptom Management Condition 1: Take all medications as prescribed Attend all scheduled provider appointments Call provider office for new concerns or questions  call the Suicide and Crisis Lifeline: 988 call the [place] National Suicide Prevention Lifeline: 316-685-3163 or TTY: 762-844-4268 TTY 573-553-6751) to talk to a trained counselor call 1-800-273-TALK (toll free, 24 hour hotline) if experiencing a Mental Health or [place]  call office if I gain more than 2 pounds in one day or 5 pounds in one week keep legs up while sitting track weight in diary use salt in moderation watch for swelling in feet, ankles and legs every day weigh myself daily begin a heart failure diary bring diary to all appointments develop a rescue plan follow rescue plan if symptoms flare-up track symptoms and what helps feel better or worse  Chronic Condition 2: DM Provider Assessment and Plan  Relevant Orders     Lipid panel (Completed)    Hemoglobin A1c (Completed)     Expected Outcome/Goals Addressed This Visit (Provider CCM goals/Provider Assessment and plan   CCM (DIABETES) EXPECTED OUTCOME: MONITOR, SELF-MANAGE AND REDUCE SYMPTOMS OF DIABETES   Symptom Management Condition 2: Take all medications as prescribed Attend all scheduled provider appointments Call provider  office for new concerns or questions  call the Suicide and Crisis Lifeline: 988 call the [place] National Suicide Prevention Lifeline: 727-657-0326 or TTY: 256-884-1370 TTY (816)776-0706) to talk to a trained counselor call 1-800-273-TALK (toll free, 24 hour hotline) if experiencing a Mental Health or [place]  check feet daily for cuts, sores or redness trim toenails straight across wash and dry feet carefully every day wear comfortable, cotton socks wear comfortable, well-fitting shoes  Chronic Condition 3: HTN Provider Assessment and Plan Continue losartan 12.5 mg daily (dose decreased due to low BP)    Expected Outcome/Goals Addressed This Visit (Provider CCM goals/Provider Assessment and plan   CCM (HYPERTENSION)  EXPECTED OUTCOME:  MONITOR,SELF- MANAGE AND REDUCE SYMPTOMS OF HYPERTENSION   Symptom Management Condition 3: Take all medications as prescribed Attend all scheduled provider appointments Call provider office for new concerns or questions  call the Suicide and Crisis Lifeline: 988 call the [place] National Suicide Prevention Lifeline: (854)399-0617 or TTY: 704 566 [address] [place] [phone]) to talk to a trained counselor call 1-800-273-TALK (toll free, 24 hour hotline) if experiencing a Mental Health or [place]  check blood pressure 3 times per week write blood pressure results in a log or diary learn about high blood pressure keep a blood pressure log take blood pressure log to all doctor appointments call doctor for signs and symptoms of high blood pressure keep all doctor appointments take medications for blood pressure exactly as prescribed report new symptoms to your doctor  Problem List Patient Active Problem List   Diagnosis Date Noted   PVC's (premature ventricular contractions) 11/23/2021   Chronic kidney disease, stage 3b ([place]) 11/23/2021   Amputated great toe,  right ([place]) 03/05/2021   Cellulitis of foot    Diabetic foot  infection ([place])    Diabetic foot ulcer ([place]) 02/21/2021   Diabetic polyneuropathy associated with type 2 diabetes mellitus ([place]) 11/23/2020   Orthostatic hypotension 10/16/2020   Obstructive sleep apnea 06/29/2020   Urge incontinence of urine 12/22/2019   Trifascicular block 10/20/2019   Wound infection 10/08/2019   Bifascicular block 05/13/2019   Coronary stent patent 04/13/2019   Chronic HFrEF (heart failure with reduced ejection fraction) ([place]) 03/19/2019   PSVT (paroxysmal supraventricular tachycardia) 03/19/2019   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 ([place]) 08/24/2018   CKD (chronic kidney disease), stage IV ([place]) 06/04/2018   Osteoarthritis of knees, bilateral 06/02/2018   Hyperlipidemia associated with type 2 diabetes mellitus ([place]) 05/06/2018   Pain of lower extremity 03/06/2018   Duodenitis 01/30/2018   Hematochezia    Melena    Rectal bleeding 01/11/2018   AVM (arteriovenous malformation) of colon without hemorrhage    Schatzki's ring    Esophageal dysphagia    Diabetic retinopathy ([place]) 07/08/2017   Cardiomyopathy ([place]) 06/26/2017   Centrilobular emphysema ([place]) 06/13/2017   Fatigue 12/18/2016   Stable angina 09/20/2016   Stage 3a chronic kidney disease ([place]) 09/20/2016   Paroxysmal atrial fibrillation ([place]) 08/28/2016   Chronic anticoagulation 08/28/2016   Essential hypertension 08/20/2016   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 heart with stable angina pectoris ([place]) 08/19/2016   Exertional dyspnea 08/12/2016   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[place]) 12/29/2015   Osteomyelitis of great toe of right foot ([place]) 10/03/2015   Ulcerated, foot, right, with necrosis of bone ([place]) 09/26/2015   Gynecomastia, male 03/31/2014   Hypertrophy of breast 03/31/2014   Nuclear sclerosis of left eye 10/22/2013   Enthesopathy of ankle and tarsus 04/10/2013   Controlled type 2 diabetes mellitus with diabetic nephropathy, with long-term current use of insulin ([place])  04/09/2013   Diabetic neuropathy associated with type 2 diabetes mellitus ([place]) 04/09/2013   Cataract of left eye 11/25/2011   Corneal opacity 09/30/2011   Pseudophakia of right eye 09/30/2011   GERD (gastroesophageal reflux disease) 09/25/2011   Gout 09/25/2011   Hyperlipidemia LDL goal <70 09/25/2011   Status post cataract extraction 04/11/2011   Cataract extraction status of eye 04/11/2011   Iron deficiency anemia 04/01/2011   Benign localized hyperplasia of prostate without urinary obstruction and other lower urinary tract symptoms (LUTS) 12/10/2010   Enlarged prostate without lower urinary tract symptoms (luts) 12/10/2010   Colon cancer screening 10/11/2010   Hypothyroidism 10/11/2010   Hyperlipidemia 09/20/2003   Essential and other specified forms of tremor 01/05/2001   Essential tremor 01/05/2001    Medication Management  Current Outpatient Medications:    acetaminophen (TYLENOL) 500 MG tablet, Take 1,000 mg by mouth every 6 (six) hours as needed for moderate pain or headache. , Disp: , Rfl:    Alcohol Swabs (B-D SINGLE USE SWABS REGULAR) PADS, Use to check blood sugar up to 2 times daily, Disp: 200 each, Rfl: 3   allopurinol (ZYLOPRIM) 100 MG tablet, Take 1 tablet (100 mg total) by mouth daily., Disp: 90 tablet, Rfl: 3   aspirin 81 MG chewable tablet, Chew 81 mg by mouth daily., Disp: , Rfl:    Blood Glucose Calibration (OT ULTRA/FASTTK CNTRL SOLN) SOLN, , Disp: , Rfl:    Blood Glucose Monitoring Suppl (ONE TOUCH ULTRA 2) w/Device KIT, SMARTSIG:Via Meter, Disp: , Rfl:    bosutinib (BOSULIF) 100 MG tablet, Take 100 mg  by mouth at bedtime. Take with food., Disp: , Rfl:    Cholecalciferol 25 MCG (1000 UT) capsule, Take 1,000 Units by mouth daily., Disp: , Rfl:    famotidine (PEPCID) 40 MG tablet, Take 1 tablet (40 mg total) by mouth at bedtime., Disp: 90 tablet, Rfl: 0   gabapentin (NEURONTIN) 600 MG tablet, Take 1 tablet (600 mg total) by mouth 2 (two) times daily., Disp: 180  tablet, Rfl: 1   glucose blood (ONETOUCH VERIO) test strip, USE TO TEST BLOOD SUGAR EVERY MORNING AND EVERY NIGHT AT BEDTIME, Disp: 100 strip, Rfl: 0   Insulin Glargine ([name] KWIKPEN) 100 UNIT/ML, Inject 22 Units into the skin daily., Disp: , Rfl:    Insulin Pen Needle 31G X 5 MM MISC, Use to inject insulin daily., Disp: 100 each, Rfl: 3   isosorbide mononitrate (IMDUR) 60 MG 24 hr tablet, Take 0.5 tablets (30 mg total) by mouth daily., Disp: 45 tablet, Rfl: 2   JARDIANCE 10 MG TABS tablet, TAKE 1 TABLET(10 MG) BY MOUTH DAILY WITH BREAKFAST, Disp: 90 tablet, Rfl: 0   Lancets (ONETOUCH DELICA PLUS [id number]) MISC, Use to check blood sugar twice daily, Disp: 200 each, Rfl: 3   levothyroxine (SYNTHROID) 25 MCG tablet, TAKE 1 TABLET BY MOUTH EVERY MORNING BEFORE BREAKFAST, Disp: 90 tablet, Rfl: 0   losartan (COZAAR) 25 MG tablet, TAKE 1/2 TABLET(12.5 MG) BY MOUTH DAILY, Disp: 45 tablet, Rfl: 1   Menthol-Zinc Oxide (GOLD BOND EX), Apply 1 application topically daily as needed (muscle pain)., Disp: , Rfl:    metolazone (ZAROXOLYN) 2.5 MG tablet, Take 1 tablet (2.5 mg total) by mouth once a week. Every Wednesday. Take this medication with potassium, Disp: 10 tablet, Rfl: 3   mirabegron ER (MYRBETRIQ) 25 MG TB24 tablet, Take 1 tablet (25 mg total) by mouth daily., Disp: 90 tablet, Rfl: 3   Multiple Vitamin (MULTIVITAMIN WITH MINERALS) TABS tablet, Take 1 tablet by mouth daily., Disp: , Rfl:    nitroGLYCERIN (NITROSTAT) 0.4 MG SL tablet, For chest pain, tightness, or pressure. While sitting, place 1 tablet under tongue. May be used every 5 minutes as needed, for up to 15 minutes. Do not use more than 3 tablets., Disp: 25 tablet, Rfl: 1   Polyethyl Glycol-Propyl Glycol (SYSTANE OP), Place 1 drop into both eyes 3 (three) times daily as needed (dry/irritated eyes.). , Disp: , Rfl:    polyethylene glycol (MIRALAX / GLYCOLAX) 17 g packet, Take 17 g by mouth daily as needed for mild constipation. , Disp: , Rfl:     potassium chloride SA (KLOR-CON M) 20 MEQ tablet, Take 2 tablets (40 mEq total) by mouth once a week. Take with metolazone every Wednesday, Disp: 10 tablet, Rfl: 3   ranolazine (RANEXA) 500 MG 12 hr tablet, TAKE 1 TABLET BY MOUTH TWICE DAILY. PLEASE KEEP UPCOMING APPOINTMENT FOR FUTURE REFILLS, Disp: 180 tablet, Rfl: 0   rosuvastatin (CRESTOR) 5 MG tablet, Take 1 tablet (5 mg total) by mouth daily., Disp: 90 tablet, Rfl: 3   tamsulosin (FLOMAX) 0.4 MG CAPS capsule, Take 1 capsule (0.4 mg total) by mouth daily after supper., Disp: 30 capsule, Rfl: 11   torsemide (DEMADEX) 20 MG tablet, Take 3 tablets (60 mg total) by mouth 2 (two) times daily., Disp: 240 tablet, Rfl: 3   traMADol (ULTRAM) 50 MG tablet, Take 1-2 tablets (50-100 mg total) by mouth at bedtime as needed., Disp: 30 tablet, Rfl: 2   warfarin (COUMADIN) 3 MG tablet, TAKE 1 TO 1 &  1/2 TABLETS BY MOUTH AS DIRECTED BY THE COUMADIN CLINIC Strength: 3 mg, Disp: 135 tablet, Rfl: 0  Cognitive Assessment Identity Confirmed: : Name; DOB Cognitive Status: Normal Other:  : HIPPA verified on behalf of the patients wife and DRP-[name], patient was on speaker phone   Functional Assessment Hearing Difficulty or Deaf: no Wear Glasses or Blind: yes Vision Management: wears glasses Concentrating, Remembering or Making Decisions Difficulty (CP): no Difficulty Communicating: no Difficulty Eating/Swallowing: no Walking or Climbing Stairs Difficulty: no Dressing/Bathing Difficulty: no Doing Errands Independently Difficulty (such as shopping) (CP): no Change in Functional Status Since Onset of Current Illness/Injury: no   Caregiver Assessment  Primary Source of Support/Comfort: spouse Name of Support/Comfort Primary Source: [employer] People in Home: spouse Name(s) of People in Home: [place]- wife Family Caregiver if Needed: spouse Family Caregiver Names: [name], spouse Primary Roles/Responsibilities: retired Expected Impact of  Illness/Hospitalization: patients wife says "we have to watch the salt"   Planned Interventions  Provided education to patient about basic DM disease process; Reviewed medications with patient and discussed importance of medication adherence;        Reviewed prescribed diet with patient heart healthy/ADA; Counseled on importance of regular laboratory monitoring as prescribed;        Discussed plans with patient for ongoing care management follow up and provided patient with direct contact information for care management team;      Provided patient with written educational materials related to hypo and hyperglycemia and importance of correct treatment;       Reviewed scheduled/upcoming provider appointments including: 06-05-2022,  has several with specialist;         Advised patient, providing education and rationale, to check cbg twice daily and when you have symptoms of low or high blood sugar and record. The patients wife gave several readings. The lowest was 134 and the highest was 214. The patient usually has average over several days of 147. Education and support given.        call provider for findings outside established parameters;       Referral made to pharmacy team for assistance with ongoing support and education for medication needs;       Review of patient status, including review of consultants reports, relevant laboratory and other test results, and medications completed;       Advised patient to discuss changes in DM and other questions and concerns with provider;      Screening for signs and symptoms of depression related to chronic disease state;        Assessed social determinant of health barriers;        Take medications as prescribed   Attend all scheduled provider appointments Call provider office for new concerns or questions  call the Suicide and Crisis Lifeline: 988 call the [place] National Suicide Prevention Lifeline: 650-231-5418 or TTY: (785)415-4127 TTY  301-431-6324) to talk to a trained counselor call 1-800-273-TALK (toll free, 24 hour hotline) if experiencing a Mental Health or [place]  check blood pressure 3 times per week write blood pressure results in a log or diary learn about high blood pressure keep a blood pressure log take blood pressure log to all doctor appointments call doctor for signs and symptoms of high blood pressure develop an action plan for high blood pressure keep all doctor appointments take medications for blood pressure exactly as prescribed report new symptoms to your doctor Basic overview and discussion of pathophysiology of Heart Failure reviewed Provided education on  low sodium diet. The patient wife states they are really watching his sodium content in foods Reviewed Heart Failure Action Plan in depth and provided written copy Assessed need for readable accurate scales in home Provided education about placing scale on hard, flat surface Advised patient to weigh each morning after emptying bladder Discussed importance of daily weight and advised patient to weigh and record daily Reviewed role of diuretics in prevention of fluid overload and management of heart failure. The patient is working with the cardiologist for trying to figure out the best way to keep fluids and weight balanced. Discussed the importance of keeping all appointments with provider Advised patient to discuss changes in heart failure with provider Screening for signs and symptoms of depression related to chronic disease state  Assessed social determinant of health barriers    Interaction and coordination with outside resources, practitioners, and providers See CCM Referral  Care Plan: Patient declined

## 2022-01-30 ENCOUNTER — Ambulatory Visit: Payer: Medicare HMO | Attending: Internal Medicine

## 2022-01-30 DIAGNOSIS — Z7901 Long term (current) use of anticoagulants: Secondary | ICD-10-CM | POA: Diagnosis not present

## 2022-01-30 DIAGNOSIS — I480 Paroxysmal atrial fibrillation: Secondary | ICD-10-CM | POA: Diagnosis not present

## 2022-01-30 LAB — POCT INR: INR: 2.5 (ref 2.0–3.0)

## 2022-01-30 NOTE — Patient Instructions (Signed)
Continue dosage of warfarin of 1.5 TABLETS EVERY DAY EXCEPT 1 TABLET ON MONDAYS & FRIDAYS.  Recheck INR in 6 weeks

## 2022-01-31 ENCOUNTER — Other Ambulatory Visit (HOSPITAL_COMMUNITY): Payer: Self-pay

## 2022-02-04 DIAGNOSIS — C9210 Chronic myeloid leukemia, BCR/ABL-positive, not having achieved remission: Principal | ICD-10-CM

## 2022-02-05 ENCOUNTER — Ambulatory Visit (HOSPITAL_BASED_OUTPATIENT_CLINIC_OR_DEPARTMENT_OTHER): Payer: Medicare HMO | Admitting: Internal Medicine

## 2022-02-05 ENCOUNTER — Other Ambulatory Visit
Admission: RE | Admit: 2022-02-05 | Discharge: 2022-02-05 | Disposition: A | Discharge status: Home / Self Care | Payer: Medicare HMO | Source: Ambulatory Visit | Attending: Internal Medicine | Admitting: Internal Medicine

## 2022-02-05 ENCOUNTER — Encounter: Payer: Self-pay | Admitting: Internal Medicine

## 2022-02-05 VITALS — BP 130/74 | HR 88 | Resp 16 | Ht 64.0 in | Wt 193.2 lb

## 2022-02-05 DIAGNOSIS — I471 Supraventricular tachycardia, unspecified: Secondary | ICD-10-CM | POA: Insufficient documentation

## 2022-02-05 DIAGNOSIS — I2510 Atherosclerotic heart disease of native coronary artery without angina pectoris: Secondary | ICD-10-CM | POA: Insufficient documentation

## 2022-02-05 DIAGNOSIS — E669 Obesity, unspecified: Secondary | ICD-10-CM | POA: Insufficient documentation

## 2022-02-05 DIAGNOSIS — R42 Dizziness and giddiness: Secondary | ICD-10-CM | POA: Diagnosis not present

## 2022-02-05 DIAGNOSIS — I130 Hypertensive heart and chronic kidney disease with heart failure and stage 1 through stage 4 chronic kidney disease, or unspecified chronic kidney disease: Secondary | ICD-10-CM | POA: Insufficient documentation

## 2022-02-05 DIAGNOSIS — I480 Paroxysmal atrial fibrillation: Secondary | ICD-10-CM | POA: Insufficient documentation

## 2022-02-05 DIAGNOSIS — R062 Wheezing: Secondary | ICD-10-CM | POA: Insufficient documentation

## 2022-02-05 DIAGNOSIS — Z7984 Long term (current) use of oral hypoglycemic drugs: Secondary | ICD-10-CM | POA: Insufficient documentation

## 2022-02-05 DIAGNOSIS — I5023 Acute on chronic systolic (congestive) heart failure: Secondary | ICD-10-CM | POA: Diagnosis not present

## 2022-02-05 DIAGNOSIS — I5022 Chronic systolic (congestive) heart failure: Secondary | ICD-10-CM | POA: Insufficient documentation

## 2022-02-05 DIAGNOSIS — R531 Weakness: Secondary | ICD-10-CM | POA: Diagnosis not present

## 2022-02-05 DIAGNOSIS — E785 Hyperlipidemia, unspecified: Secondary | ICD-10-CM | POA: Insufficient documentation

## 2022-02-05 DIAGNOSIS — I428 Other cardiomyopathies: Secondary | ICD-10-CM | POA: Insufficient documentation

## 2022-02-05 DIAGNOSIS — Z79899 Other long term (current) drug therapy: Secondary | ICD-10-CM | POA: Insufficient documentation

## 2022-02-05 DIAGNOSIS — N1832 Chronic kidney disease, stage 3b: Secondary | ICD-10-CM | POA: Insufficient documentation

## 2022-02-05 DIAGNOSIS — R5383 Other fatigue: Secondary | ICD-10-CM | POA: Diagnosis not present

## 2022-02-05 DIAGNOSIS — Z7901 Long term (current) use of anticoagulants: Secondary | ICD-10-CM | POA: Insufficient documentation

## 2022-02-05 DIAGNOSIS — Z955 Presence of coronary angioplasty implant and graft: Secondary | ICD-10-CM | POA: Insufficient documentation

## 2022-02-05 DIAGNOSIS — C9210 Chronic myeloid leukemia, BCR/ABL-positive, not having achieved remission: Secondary | ICD-10-CM | POA: Insufficient documentation

## 2022-02-05 DIAGNOSIS — E119 Type 2 diabetes mellitus without complications: Secondary | ICD-10-CM | POA: Insufficient documentation

## 2022-02-05 LAB — CBC
HCT: 42.5 % (ref 39.0–52.0)
Hemoglobin: 14.3 g/dL (ref 13.0–17.0)
MCH: 28.9 pg (ref 26.0–34.0)
MCHC: 33.6 g/dL (ref 30.0–36.0)
MCV: 86 fL (ref 80.0–100.0)
Platelets: 189 10*3/uL (ref 150–400)
RBC: 4.94 MIL/uL (ref 4.22–5.81)
RDW: 13.3 % (ref 11.5–15.5)
WBC: 8.9 10*3/uL (ref 4.0–10.5)
nRBC: 0 % (ref 0.0–0.2)

## 2022-02-05 LAB — COMPREHENSIVE METABOLIC PANEL
ALT: 12 U/L (ref 0–44)
AST: 25 U/L (ref 15–41)
Albumin: 4.1 g/dL (ref 3.5–5.0)
Alkaline Phosphatase: 78 U/L (ref 38–126)
Anion gap: 13 (ref 5–15)
BUN: 70 mg/dL — ABNORMAL HIGH (ref 8–23)
CO2: 31 mmol/L (ref 22–32)
Calcium: 9.5 mg/dL (ref 8.9–10.3)
Chloride: 88 mmol/L — ABNORMAL LOW (ref 98–111)
Creatinine, Ser: 2.21 mg/dL — ABNORMAL HIGH (ref 0.61–1.24)
GFR, Estimated: 28 mL/min — ABNORMAL LOW (ref >60)
Glucose, Bld: 263 mg/dL — ABNORMAL HIGH (ref 70–99)
Potassium: 3.2 mmol/L — ABNORMAL LOW (ref 3.5–5.1)
Sodium: 132 mmol/L — ABNORMAL LOW (ref 135–145)
Total Bilirubin: 0.8 mg/dL (ref 0.3–1.2)
Total Protein: 8.3 g/dL — ABNORMAL HIGH (ref 6.5–8.1)

## 2022-02-05 LAB — TSH: TSH: 3.204 u[IU]/mL (ref 0.350–4.500)

## 2022-02-05 LAB — BRAIN NATRIURETIC PEPTIDE: B Natriuretic Peptide: 113.8 pg/mL — ABNORMAL HIGH (ref 0.0–100.0)

## 2022-02-05 NOTE — Progress Notes (Signed)
Advanced Heart Failure Clinic Note   Date:  02/05/2022   ID:  Lucas Caldwell, DOB 04/07/1933, MRN [id number]  Location: Home  Provider location: [place] [facility] Type of Visit: Established patient   PCP:  [name], DO  Cardiologist:  [name], MD HF Cardiologist: [name]  Chief Complaint:  No chief complaint on file.  Heart failure follow up   HPI: Lucas Caldwell is a 88 y.o. 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 Saunders Revel for further evaluation of his HF.     Has h/o NICM previously followed by Dr. Carolynn Serve at [facility]. He had good response to medical therapy in 2012 EF was reported to be 50%    Had PCI of his RCA CAD s/p RCA stent x2 08/2016 at [facility].    Echo 6/20 LVEF 35-40%   Cardiac cath 08/2018 70% LM stenosis with abnl FFR of 0.86, though drop in iFR was due to diffuse prox LAD disease and only small gradient involving ostial LM. Imdur was increased.    Admitted 04/13/19 with chest pain and A/C systolic heart failure. Repeat Cath 1/21 LM 60% LAD 70% with +FFR otherwise diffuse non-obstructive CAD -> PCI/DES LAD 1/21. RHC: RA 10 PA 53/30 (40) PCWP 28 CO/CI 4.2/2.1 PA sat 67%.   Echo 04/22/19 EF 30-35%   Referred to [facility] 04/2019 for further management of his systolic HF. Had cardiomems implanted in 7/22   Zio 4/21 SR w/ 1st AVB, avg HR 81 bpm, 1 brief 5 beat run of NSVT, Two runs SVT   Echo 8/22 EF 30-35% RV ok   Echo 1/23 EF 30-35%  Cardiomems was running high last week and torsemide increased to 80 bid. PAD on cardiomems 27 ->-> 15 ->-> 21-> 23 (on 11/7)  Labs on 11/3 Scr 1.73 -> 1.99  Here with his wife. At last visit mild volume overload. ReDS 41%. Torsemide dropped back to 60 daily and metolazone added every Wednesday. Here for f/u. Says he feels terrible.  Feels unsteady. Gets dizzy when standing at times. No f/c. Room not spinning. No other neuro deficits. Has only taken 2 metolazone pills.   ReDs 41%    SBP 121 when sitting 113 when standing.     Past Medical History:  Diagnosis Date   Arthritis    "probably in his legs" (08/15/2016)   Blind right eye    BPH (benign prostatic hyperplasia)    Breast asymmetry    Left breast is larger, present for several years.   CAD (coronary artery disease)    a. Cath in the late 90's - reportedly ok;  b. 2014 s/p stenting x 2 @ [facility]; c 08/19/16 Cath/PCI with DES -> RCA, plan to treat LM 70% medically. Seen by surgery and felt to be too high risk for CABG; d. 08/2018 Cath: LM 70 (iFR 0.86), LAD 20ost, 40p, 50/36m D1 70, LCX patent stent, OM1 30, RCA patent stent, 476mSR, 40d, RPDA 30. PCWP 8. CO/CI 4.0/2.1; e. 03/2019 PCI to LAD (2.5x15 Resolute Onyx DES).   Chronic combined systolic (congestive) and diastolic (congestive) heart failure ([facility]   a. Previously reduced EF-->50% by echo in 2012;  b. 06/2015 Echo: EF 50-55%  c. 07/2016 Echo: EF 45-50%; d. 12/2017 Echo: EF 55%; e. 08/2018 Echo: EF 35-40%; f. 04/2019  Echo: EF 30-35%; g. s/p cardiomems; h. 10/2020 Echo: EF 30-35%, mod-sev glob HK, gr1 DD.   Chronic kidney disease (CKD), stage IV (severe) (HCC)    CML (chronic myelocytic leukemia) (HCC)    GERD (gastroesophageal reflux disease)    GIB (gastrointestinal bleeding)    a. 12/2017 3 unit PRBC GIB in setting of coumadin-->Endo/colon multiple duodenal ulcers and a single bleeding ulcer in the proximal ascending colon status post hemostatic clipping x2.   Gout    Hyperlipemia    Hypertension    Hypothyroidism    Iron deficiency anemia    Ischemic cardiomyopathy    a. Previously reduced EF-->50% by echo in 2012;  b. 06/2015 Echo: EF 50-55%, Gr2 DD;  c. 07/2016 Echo: EF 45-50%; d. 12/2017 Echo: EF 55%, Gr1 DD, mild MR; e. 08/2018 Echo: EF 35-40%; f. 04/2019 Echo: EF 30-35%; g. 10/2020 Echo: EF 30-35%.   Migraine     "in the 1960s" (08/15/2016)   Obstructive sleep apnea    Orthostatic hypotension    a. 20222 -->carvedilol and hydralazine d/c'd.   PAF (paroxysmal atrial fibrillation) (HCC)    a. ? Dx 2014-->s/p DCCV;  b. CHA2DS2VASc = 6--> Coumadin.   Prostate cancer ([place])    RBBB    Type II diabetes mellitus [facility])    Past Surgical History:  Procedure Laterality Date   AMPUTATION TOE Right 02/23/2021   Procedure: AMPUTATION TOE;  Surgeon: Samara Deist, DPM;  Location: [facility] ORS;  Service: Podiatry;  Laterality: Right;   CATARACT EXTRACTION W/ INTRAOCULAR LENS  IMPLANT, BILATERAL Bilateral    COLONOSCOPY N/A 01/13/2018   Procedure: COLONOSCOPY;  Surgeon: Lin Landsman, MD;  Location: [facility] ENDOSCOPY;  Service: Gastroenterology;  Laterality: N/A;   COLONOSCOPY WITH PROPOFOL N/A 01/01/2018   Procedure: COLONOSCOPY WITH PROPOFOL;  Surgeon: Lin Landsman, MD;  Location: [facility] ENDOSCOPY;  Service: Gastroenterology;  Laterality: N/A;   CORONARY ANGIOPLASTY WITH STENT PLACEMENT  09/2012   2 stents   CORONARY STENT INTERVENTION N/A 08/19/2016   Procedure: Coronary Stent Intervention;  Surgeon: [name], MD;  Location: [place] CV LAB;  Service: Cardiovascular;  Laterality: N/A;   CORONARY STENT INTERVENTION N/A 04/13/2019   Procedure: CORONARY STENT INTERVENTION;  Surgeon: [name], MD;  Location: [place] CV LAB;  Service: Cardiovascular;  Laterality: N/A;   ESOPHAGEAL MANOMETRY N/A 12/16/2017   Procedure: ESOPHAGEAL MANOMETRY (EM);  Surgeon: Lin Landsman, MD;  Location: [facility] ENDOSCOPY;  Service: Gastroenterology;  Laterality: N/A;   ESOPHAGOGASTRODUODENOSCOPY N/A 12/20/2016   Procedure: ESOPHAGOGASTRODUODENOSCOPY (EGD);  Surgeon: Lin Landsman, MD;  Location: [facility] ENDOSCOPY;  Service: Gastroenterology;  Laterality: N/A;   ESOPHAGOGASTRODUODENOSCOPY N/A 01/13/2018   Procedure: ESOPHAGOGASTRODUODENOSCOPY (EGD);  Surgeon: Lin Landsman, MD;  Location: [facility]  ENDOSCOPY;  Service: Gastroenterology;  Laterality: N/A;   ESOPHAGOGASTRODUODENOSCOPY (EGD) WITH PROPOFOL N/A 11/03/2017   Procedure: ESOPHAGOGASTRODUODENOSCOPY (EGD) WITH PROPOFOL;  Surgeon: Lin Landsman, MD;  Location: [facility] ENDOSCOPY;  Service: Gastroenterology;  Laterality: N/A;   EYE SURGERY     HERNIA REPAIR     "[place]"   LEFT HEART CATH AND CORONARY ANGIOGRAPHY N/A 08/14/2016   Procedure: Left Heart Cath and Coronary Angiography;  Surgeon: [name], MD;  Location: [place] CV LAB;  Service: Cardiovascular;  Laterality: N/A;   PRESSURE SENSOR/CARDIOMEMS N/A 09/23/2019   Procedure: PRESSURE SENSOR/CARDIOMEMS;  Surgeon: Jolaine Artist, MD;  Location: [place] CV LAB;  Service: Cardiovascular;  Laterality: N/A;   RIGHT HEART CATH N/A 09/23/2019   Procedure: RIGHT HEART  CATH;  Surgeon: Jolaine Artist, MD;  Location: [place] CV LAB;  Service: Cardiovascular;  Laterality: N/A;   RIGHT/LEFT HEART CATH AND CORONARY ANGIOGRAPHY N/A 09/01/2018   Procedure: RIGHT/LEFT HEART CATH AND CORONARY ANGIOGRAPHY;  Surgeon: [name], MD;  Location: [place] CV LAB;  Service: Cardiovascular;  Laterality: N/A;   RIGHT/LEFT HEART CATH AND CORONARY ANGIOGRAPHY N/A 04/13/2019   Procedure: RIGHT/LEFT HEART CATH AND CORONARY ANGIOGRAPHY;  Surgeon: [name], MD;  Location: [place] CV LAB;  Service: Cardiovascular;  Laterality: N/A;   TOE AMPUTATION Right    "big toe"   Current Outpatient Medications  Medication Sig Dispense Refill   acetaminophen (TYLENOL) 500 MG tablet Take 1,000 mg by mouth every 6 (six) hours as needed for moderate pain or headache.      Alcohol Swabs (B-D SINGLE USE SWABS REGULAR) PADS Use to check blood sugar up to 2 times daily 200 each 3   allopurinol (ZYLOPRIM) 100 MG tablet Take 1 tablet (100 mg total) by mouth daily. 90 tablet 3   aspirin 81 MG chewable tablet Chew 81 mg by mouth daily.     Blood Glucose Calibration (OT ULTRA/FASTTK  CNTRL SOLN) SOLN      Blood Glucose Monitoring Suppl (ONE TOUCH ULTRA 2) w/Device KIT SMARTSIG:Via Meter     bosutinib (BOSULIF) 100 MG tablet Take 100 mg by mouth at bedtime. Take with food.     Cholecalciferol 25 MCG (1000 UT) capsule Take 1,000 Units by mouth daily.     famotidine (PEPCID) 40 MG tablet Take 1 tablet (40 mg total) by mouth at bedtime. 90 tablet 0   gabapentin (NEURONTIN) 600 MG tablet Take 1 tablet (600 mg total) by mouth 2 (two) times daily. 180 tablet 1   glucose blood (ONETOUCH VERIO) test strip USE TO TEST BLOOD SUGAR EVERY MORNING AND EVERY NIGHT AT BEDTIME 100 strip 0   Insulin Glargine ([name] KWIKPEN) 100 UNIT/ML Inject 22 Units into the skin daily.     Insulin Pen Needle 31G X 5 MM MISC Use to inject insulin daily. 100 each 3   isosorbide mononitrate (IMDUR) 60 MG 24 hr tablet Take 0.5 tablets (30 mg total) by mouth daily. 45 tablet 2   JARDIANCE 10 MG TABS tablet TAKE 1 TABLET(10 MG) BY MOUTH DAILY WITH BREAKFAST 90 tablet 0   Lancets (ONETOUCH DELICA PLUS [id number]) MISC Use to check blood sugar twice daily 200 each 3   levothyroxine (SYNTHROID) 25 MCG tablet TAKE 1 TABLET BY MOUTH EVERY MORNING BEFORE BREAKFAST 90 tablet 0   losartan (COZAAR) 25 MG tablet TAKE 1/2 TABLET(12.5 MG) BY MOUTH DAILY 45 tablet 1   Menthol-Zinc Oxide (GOLD BOND EX) Apply 1 application topically daily as needed (muscle pain).     metolazone (ZAROXOLYN) 2.5 MG tablet Take 1 tablet (2.5 mg total) by mouth once a week. Every Wednesday. Take this medication with potassium 10 tablet 3   mirabegron ER (MYRBETRIQ) 25 MG TB24 tablet Take 1 tablet (25 mg total) by mouth daily. 90 tablet 3   Multiple Vitamin (MULTIVITAMIN WITH MINERALS) TABS tablet Take 1 tablet by mouth daily.     nitroGLYCERIN (NITROSTAT) 0.4 MG SL tablet For chest pain, tightness, or pressure. While sitting, place 1 tablet under tongue. May be used every 5 minutes as needed, for up to 15 minutes. Do not use more than 3 tablets. 25  tablet 1   Polyethyl Glycol-Propyl Glycol (SYSTANE OP) Place 1 drop into both eyes 3 (three) times daily  as needed (dry/irritated eyes.).      polyethylene glycol (MIRALAX / GLYCOLAX) 17 g packet Take 17 g by mouth daily as needed for mild constipation.      potassium chloride SA (KLOR-CON M) 20 MEQ tablet Take 2 tablets (40 mEq total) by mouth once a week. Take with metolazone every Wednesday 10 tablet 3   ranolazine (RANEXA) 500 MG 12 hr tablet TAKE 1 TABLET BY MOUTH TWICE DAILY. PLEASE KEEP UPCOMING APPOINTMENT FOR FUTURE REFILLS 180 tablet 0   rosuvastatin (CRESTOR) 5 MG tablet Take 1 tablet (5 mg total) by mouth daily. 90 tablet 3   tamsulosin (FLOMAX) 0.4 MG CAPS capsule Take 1 capsule (0.4 mg total) by mouth daily after supper. 30 capsule 11   torsemide (DEMADEX) 20 MG tablet Take 3 tablets (60 mg total) by mouth 2 (two) times daily. 240 tablet 3   traMADol (ULTRAM) 50 MG tablet Take 1-2 tablets (50-100 mg total) by mouth at bedtime as needed. 30 tablet 2   warfarin (COUMADIN) 3 MG tablet TAKE 1 TO 1 & 1/2 TABLETS BY MOUTH AS DIRECTED BY THE COUMADIN CLINIC Strength: 3 mg 135 tablet 0   No current facility-administered medications for this visit.    Allergies:   Clopidogrel, Ciprofloxacin, Beta adrenergic blockers, Mirtazapine, and Spironolactone   Social History:  The patient  reports that he quit smoking about 48 years ago. His smoking use included cigarettes. He started smoking about 68 years ago. He has a 35.00 pack-year smoking history. He has quit using smokeless tobacco. He reports that he does not drink alcohol and does not use drugs.   Family History:  The patient's family history includes Brain cancer in his father; Diabetes in his sister; Heart attack in his sister.   ROS:  Please see the history of present illness.   All other systems are personally reviewed and negative.   BP 130/74 (BP Location: Right Arm, Patient Position: Sitting)   Pulse 88   Resp 16   Ht _0  (1.626  m)   Wt 193 lb 4 oz (87.7 kg)   SpO2 99%   BMI 33.17 kg/m   Exam:   General:  Obese male Well appearing. No resp difficulty Neck: supple. no JVD. Carotids 2+ bilat; no bruits. No lymphadenopathy or thryomegaly appreciated. Cor: PMI nondisplaced. Regular rate & rhythm. No rubs, gallops or murmurs. Lungs: mild wheeze  Abdomen: soft, nontender, nondistended. No hepatosplenomegaly. No bruits or masses. Good bowel sounds. Extremities: no cyanosis, clubbing, rash, edema Neuro: alert & orientedx3, cranial nerves grossly intact. moves all 4 extremities w/o difficulty. Affect pleasant  12/05/2021: ALT 8; TSH 2.69 12/11/2021: Hemoglobin 14.1; Platelets 179 01/23/2022: B Natriuretic Peptide 164.6; BUN 29; Creatinine, Ser 1.69; Potassium 4.1; Sodium 138  Personally reviewed   Wt Readings from Last 3 Encounters:  02/05/22 193 lb 4 oz (87.7 kg)  01/23/22 196 lb 6 oz (89.1 kg)  12/13/21 200 lb (90.7 kg)      ASSESSMENT AND PLAN: 1. Chronic systolic HF: - likely combination of iCM/NICM - Echo (2/21): EF 30-35%  (was 35-40% in 6/20) - RHC (04/13/19): with elevated filling pressures but BNP normal  - Echo (8/22): EF 30-35%, grade I DD, RV low normal - Echo 1/23 EF 30-35% - Stable NYHA III - Feels worse today. Symptoms sound orthostatic but ReDs (41%) and cardiomems (19) are not c/w volume depletion. SBP drops slightly with standing - Will continue torsemide 60 bid and HOLD metolazone 2.5 and kcl 40 every Wednesday. 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  - Continue losartan 12.5 mg daily (dose decreased due to low BP).  - Continue Jardiance 10 mg daily . - Labs today to check electrolytes - Wear compression socks - Refer PT/OT - F/u 2 weeks - Follow cardiomems daily.  2. CAD - Cath 04/13/19 LAD 70% with +FFR otherwise diffuse non-obstructive CAD  - S/p PCI/DES to LAD 1/21 - Followed by Dr. Saunders Revel - No s/s angina - Continue medical management.  3. CKD Stage  3b - baseline SCr ~1.7-2.0 - labs today - Hold metolazone - Avoid hypotension.  4. DM  - Continue Jardiance 10 mg daily. - No GU symptoms  5. PAF - Zio patch 4/21 showed no AF. Mostly NSR w/ brief SVT/ NSVT, PACs and PVCs - No palpitations. - Continue warfarin.    Glori Bickers, MD  12:00 PM   [place] [address] [facility] [place] [address] [phone] (office) [phone] (fax)

## 2022-02-05 NOTE — Progress Notes (Signed)
ReDS Vest / Clip - 02/05/22 1117       ReDS Vest / Clip   Station Marker A    Ruler Value 30    ReDS Value Range High volume overload    ReDS Actual Value 41

## 2022-02-05 NOTE — Patient Instructions (Signed)
Medication Changes:  DO NOT TAKE METOLAZONE THIS WEEK  Lab Work:  Labs done today, we will call you for abnormal results  Testing/Procedures:  none  Referrals:  You have been referred to Physical Therapy, they will call you for an appointment  Special Instructions // Education:  Please wear your compression hose daily, place them on as soon as you get up in the morning and remove before you go to bed at night.  Do the following things EVERYDAY: Weigh yourself in the morning before breakfast. Write it down and keep it in a log. Take your medicines as prescribed Eat low salt foods--Limit salt (sodium) to 2000 mg per day.  Stay as active as you can everyday Limit all fluids for the day to less than 2 liters   Follow-Up in: 2 weeks    If you have any questions or concerns before your next appointment please send [place] a message through [place] or call our office at [phone]

## 2022-02-06 ENCOUNTER — Ambulatory Visit: Payer: Medicare HMO | Admitting: Internal Medicine

## 2022-02-14 DIAGNOSIS — I5040 Unspecified combined systolic (congestive) and diastolic (congestive) heart failure: Secondary | ICD-10-CM

## 2022-02-14 DIAGNOSIS — I110 Hypertensive heart disease with heart failure: Secondary | ICD-10-CM

## 2022-02-14 DIAGNOSIS — Z794 Long term (current) use of insulin: Secondary | ICD-10-CM | POA: Diagnosis not present

## 2022-02-14 DIAGNOSIS — E1159 Type 2 diabetes mellitus with other circulatory complications: Secondary | ICD-10-CM

## 2022-02-18 DIAGNOSIS — B351 Tinea unguium: Secondary | ICD-10-CM | POA: Diagnosis not present

## 2022-02-18 DIAGNOSIS — M79674 Pain in right toe(s): Secondary | ICD-10-CM | POA: Diagnosis not present

## 2022-02-18 DIAGNOSIS — Z89411 Acquired absence of right great toe: Secondary | ICD-10-CM | POA: Diagnosis not present

## 2022-02-18 DIAGNOSIS — E1142 Type 2 diabetes mellitus with diabetic polyneuropathy: Secondary | ICD-10-CM | POA: Diagnosis not present

## 2022-02-18 DIAGNOSIS — M79675 Pain in left toe(s): Secondary | ICD-10-CM | POA: Diagnosis not present

## 2022-02-18 DIAGNOSIS — L84 Corns and callosities: Secondary | ICD-10-CM | POA: Diagnosis not present

## 2022-02-18 DIAGNOSIS — M216X1 Other acquired deformities of right foot: Secondary | ICD-10-CM | POA: Diagnosis not present

## 2022-02-18 DIAGNOSIS — M2042 Other hammer toe(s) (acquired), left foot: Secondary | ICD-10-CM | POA: Diagnosis not present

## 2022-02-18 DIAGNOSIS — I739 Peripheral vascular disease, unspecified: Secondary | ICD-10-CM | POA: Diagnosis not present

## 2022-02-18 DIAGNOSIS — M216X2 Other acquired deformities of left foot: Secondary | ICD-10-CM | POA: Diagnosis not present

## 2022-02-18 DIAGNOSIS — M2041 Other hammer toe(s) (acquired), right foot: Secondary | ICD-10-CM | POA: Diagnosis not present

## 2022-02-18 DIAGNOSIS — L851 Acquired keratosis [keratoderma] palmaris et plantaris: Secondary | ICD-10-CM | POA: Diagnosis not present

## 2022-02-19 ENCOUNTER — Encounter: Payer: Medicare HMO | Admitting: Internal Medicine

## 2022-02-20 ENCOUNTER — Other Ambulatory Visit
Admission: RE | Admit: 2022-02-20 | Discharge: 2022-02-20 | Disposition: A | Discharge status: Home / Self Care | Payer: Medicare HMO | Source: Ambulatory Visit | Attending: Nurse Practitioner | Admitting: Nurse Practitioner

## 2022-02-20 ENCOUNTER — Ambulatory Visit: Payer: Medicare HMO | Attending: Nurse Practitioner | Admitting: Nurse Practitioner

## 2022-02-20 ENCOUNTER — Encounter: Payer: Self-pay | Admitting: Nurse Practitioner

## 2022-02-20 VITALS — BP 128/71 | HR 88 | Ht 64.0 in | Wt 196.4 lb

## 2022-02-20 DIAGNOSIS — I2510 Atherosclerotic heart disease of native coronary artery without angina pectoris: Secondary | ICD-10-CM

## 2022-02-20 DIAGNOSIS — I255 Ischemic cardiomyopathy: Secondary | ICD-10-CM | POA: Diagnosis not present

## 2022-02-20 DIAGNOSIS — E785 Hyperlipidemia, unspecified: Secondary | ICD-10-CM

## 2022-02-20 DIAGNOSIS — N1832 Chronic kidney disease, stage 3b: Secondary | ICD-10-CM | POA: Diagnosis not present

## 2022-02-20 DIAGNOSIS — E1121 Type 2 diabetes mellitus with diabetic nephropathy: Secondary | ICD-10-CM | POA: Insufficient documentation

## 2022-02-20 DIAGNOSIS — I5022 Chronic systolic (congestive) heart failure: Secondary | ICD-10-CM | POA: Insufficient documentation

## 2022-02-20 DIAGNOSIS — I480 Paroxysmal atrial fibrillation: Secondary | ICD-10-CM

## 2022-02-20 DIAGNOSIS — I10 Essential (primary) hypertension: Secondary | ICD-10-CM

## 2022-02-20 DIAGNOSIS — Z794 Long term (current) use of insulin: Secondary | ICD-10-CM | POA: Insufficient documentation

## 2022-02-20 LAB — BASIC METABOLIC PANEL
Anion gap: 7 (ref 5–15)
BUN: 29 mg/dL — ABNORMAL HIGH (ref 8–23)
CO2: 27 mmol/L (ref 22–32)
Calcium: 9.4 mg/dL (ref 8.9–10.3)
Chloride: 104 mmol/L (ref 98–111)
Creatinine, Ser: 1.64 mg/dL — ABNORMAL HIGH (ref 0.61–1.24)
GFR, Estimated: 40 mL/min — ABNORMAL LOW (ref >60)
Glucose, Bld: 148 mg/dL — ABNORMAL HIGH (ref 70–99)
Potassium: 4.3 mmol/L (ref 3.5–5.1)
Sodium: 138 mmol/L (ref 135–145)

## 2022-02-20 NOTE — Progress Notes (Signed)
Office Visit    Patient Name: Lucas Caldwell Date of Encounter: 02/20/2022  Primary Care Provider:  [name], DO Primary Cardiologist:  [name], MD  Chief Complaint    88 year old male with a history of CAD status post prior RCA, circumflex, and LAD stenting, ischemic cardiomyopathy, chronic combined systolic and diastolic congestive heart failure, hypertension, hyperlipidemia, diabetes, paroxysmal atrial fibrillation (on warfarin), stage III-IV chronic kidney disease, CML, iron deficiency anemia, GI bleed due to duodenal ulcers (October 2019), BPH, hypothyroidism, and peripheral neuropathy with unsteady gait, who presents for heart failure follow-up.  Past Medical History    Past Medical History:  Diagnosis Date   Arthritis    "probably in his legs" (08/15/2016)   Blind right eye    BPH (benign prostatic hyperplasia)    Breast asymmetry    Left breast is larger, present for several years.   CAD (coronary artery disease)    a. Cath in the late 90's - reportedly ok;  b. 2014 s/p stenting x 2 @ [facility]; c 08/19/16 Cath/PCI with DES -> RCA, plan to treat LM 70% medically. Seen by surgery and felt to be too high risk for CABG; d. 08/2018 Cath: LM 70 (iFR 0.86), LAD 20ost, 40p, 50/21m D1 70, LCX patent stent, OM1 30, RCA patent stent, 462mSR, 40d, RPDA 30. PCWP 8. CO/CI 4.0/2.1; e. 03/2019 PCI to LAD (2.5x15 Resolute Onyx DES).   Chronic combined systolic (congestive) and diastolic (congestive) heart failure ([place]   a. Previously reduced EF-->50% by echo in 2012;  b. 06/2015 Echo: EF 50-55%  c. 07/2016 Echo: EF 45-50%; d. 12/2017 Echo: EF 55%; e. 08/2018 Echo: EF 35-40%; f. 04/2019 Echo: EF 30-35%; g. s/p cardiomems; h. 10/2020 Echo: EF 30-35%; i. 01/2022 Echo: EF 30-35%, glob HK, GrII DD, nl rV fxn, mild to mod MR.   Chronic kidney disease (CKD), stage IV (severe) (HCC)    CML (chronic myelocytic leukemia) (HCC)    GERD (gastroesophageal reflux disease)    GIB  (gastrointestinal bleeding)    a. 12/2017 3 unit PRBC GIB in setting of coumadin-->Endo/colon multiple duodenal ulcers and a single bleeding ulcer in the proximal ascending colon status post hemostatic clipping x2.   Gout    Hyperlipemia    Hypertension    Hypothyroidism    Iron deficiency anemia    Ischemic cardiomyopathy    a. Previously reduced EF-->50% by echo in 2012;  b. 06/2015 Echo: EF 50-55%, Gr2 DD;  c. 07/2016 Echo: EF 45-50%; d. 12/2017 Echo: EF 55%, Gr1 DD, mild MR; e. 08/2018 Echo: EF 35-40%; f. 04/2019 Echo: EF 30-35%; g. 10/2020 Echo: EF 30-35%; h. Echo: EF 30-35%   Migraine    "in the 1960s" (08/15/2016)   Obstructive sleep apnea    Orthostatic hypotension    a. 20222 -->carvedilol and hydralazine d/c'd.   PAF (paroxysmal atrial fibrillation) (HCC)    a. ? Dx 2014-->s/p DCCV;  b. CHA2DS2VASc = 6--> Coumadin.   Prostate cancer ([facility]   RBBB    Type II diabetes mellitus ([facility]   Past Surgical History:  Procedure Laterality Date   AMPUTATION TOE Right 02/23/2021   Procedure: AMPUTATION TOE;  Surgeon: FoSamara DeistDPM;  Location: [facility] ORS;  Service: Podiatry;  Laterality: Right;   CATARACT EXTRACTION W/ INTRAOCULAR LENS  IMPLANT, BILATERAL Bilateral    COLONOSCOPY N/A 01/13/2018   Procedure: COLONOSCOPY;  Surgeon: VaLin LandsmanMD;  [facility];  Service: Gastroenterology;  Laterality: N/A;  COLONOSCOPY WITH PROPOFOL N/A 01/01/2018   Procedure: COLONOSCOPY WITH PROPOFOL;  Surgeon: Lin Landsman, MD;  Location[facility] ENDOSCOPY;  Service: Gastroenterology;  Laterality: N/A;   CORONARY ANGIOPLASTY WITH STENT PLACEMENT  09/2012   2 stents   CORONARY STENT INTERVENTION N/A 08/19/2016   Procedure: Coronary Stent Intervention;  Surgeon: [name], MD;  Location: [place] CV LAB;  Service: Cardiovascular;  Laterality: N/A;   CORONARY STENT INTERVENTION N/A 04/13/2019   Procedure: CORONARY STENT INTERVENTION;  Surgeon: [name], MD;   Location: [place] CV LAB;  Service: Cardiovascular;  Laterality: N/A;   ESOPHAGEAL MANOMETRY N/A 12/16/2017   Procedure: ESOPHAGEAL MANOMETRY (EM);  Surgeon: Lin Landsman, MD;  Location: [facility] ENDOSCOPY;  Service: Gastroenterology;  Laterality: N/A;   ESOPHAGOGASTRODUODENOSCOPY N/A 12/20/2016   Procedure: ESOPHAGOGASTRODUODENOSCOPY (EGD);  Surgeon: Lin Landsman, MD;  Location: [facility] ENDOSCOPY;  Service: Gastroenterology;  Laterality: N/A;   ESOPHAGOGASTRODUODENOSCOPY N/A 01/13/2018   Procedure: ESOPHAGOGASTRODUODENOSCOPY (EGD);  Surgeon: Lin Landsman, MD;  Location: [facility] ENDOSCOPY;  Service: Gastroenterology;  Laterality: N/A;   ESOPHAGOGASTRODUODENOSCOPY (EGD) WITH PROPOFOL N/A 11/03/2017   Procedure: ESOPHAGOGASTRODUODENOSCOPY (EGD) WITH PROPOFOL;  Surgeon: Lin Landsman, MD;  Location: [facility] ENDOSCOPY;  Service: Gastroenterology;  Laterality: N/A;   EYE SURGERY     HERNIA REPAIR     "navel"   LEFT HEART CATH AND CORONARY ANGIOGRAPHY N/A 08/14/2016   Procedure: Left Heart Cath and Coronary Angiography;  Surgeon: [name], MD;  Location: [place] CV LAB;  Service: Cardiovascular;  Laterality: N/A;   PRESSURE SENSOR/CARDIOMEMS N/A 09/23/2019   Procedure: PRESSURE SENSOR/CARDIOMEMS;  Surgeon: Jolaine Artist, MD;  Location: [place] CV LAB;  Service: Cardiovascular;  Laterality: N/A;   RIGHT HEART CATH N/A 09/23/2019   Procedure: RIGHT HEART CATH;  Surgeon: Jolaine Artist, MD;  Location: [place] CV LAB;  Service: Cardiovascular;  Laterality: N/A;   RIGHT/LEFT HEART CATH AND CORONARY ANGIOGRAPHY N/A 09/01/2018   Procedure: RIGHT/LEFT HEART CATH AND CORONARY ANGIOGRAPHY;  Surgeon: [name], MD;  Location: [place] CV LAB;  Service: Cardiovascular;  Laterality: N/A;   RIGHT/LEFT HEART CATH AND CORONARY ANGIOGRAPHY N/A 04/13/2019   Procedure: RIGHT/LEFT HEART CATH AND CORONARY ANGIOGRAPHY;  Surgeon: [name], MD;  Location: [place] CV LAB;  Service: Cardiovascular;  Laterality: N/A;   TOE AMPUTATION Right    "big toe"    Allergies  Allergies  Allergen Reactions   Clopidogrel Anaphylaxis and Other (See Comments)    altered mental status;  (electrolytes "out of wack" and confusion per family; THEY DO NOT REMEMBER ANY OTHER REACTION)- MSB 10/10/15   Ciprofloxacin Itching   Beta Adrenergic Blockers     Nightmares   Mirtazapine Diarrhea    Bad dreams    Spironolactone Other (See Comments)    gynecomastia    History of Present Illness    88 year old male with above complex past medical history including CAD status post prior RCA, circumflex, and LAD stents, ischemic cardiomyopathy, chronic combined systolic and diastolic congestive heart failure, hypertension, hyperlipidemia, diabetes, paroxysmal atrial fibrillation (on warfarin), stage III-IV chronic kidney disease, CML, iron deficiency anemia, GI bleed due to duodenal ulcers (October 2019), BPH, hypothyroidism, and peripheral neuropathy with unsteady gait.  He previously underwent stenting of the right coronary artery in June 2018 was noted to have residual 70% left main disease.  He was evaluated by CT surgery at that time and felt not to be a suitable surgical candidate.  Due to ongoing symptoms, he underwent  PCI of the LAD and January 2021.  EF has been variable over the years with a high of 55% by echocardiogram in 2019 with subsequent decline in EF to 30-35% by echo in February 2021.  In July 2021, he underwent CardioMEMS placement and this has been followed by our advanced heart failure team in [place] since.  Beta-blocker therapy was previously discontinued secondary to nightmares (both metoprolol and carvedilol).  Echocardiogram in August 2022 showed persistent LV dysfunction with an EF of 30 to 35%.  GDMT and diuretic dosing have been limited by orthostasis.  Most recent echo was performed in November 2023, with finding of stable LV dysfunction and an  EF of 30 to 35% with grade 2 diastolic dysfunction, normal RV function, and mild to moderate mitral regurgitation.  Over the course of this fall, he has had several visits with the advanced heart failure clinic.  In the setting of elevated cardio mems recordings in early November, metolazone 2.5 mg weekly was added on top of torsemide 60 mg twice daily.  At follow-up on November 21, patient reported significant orthostatic symptoms since being placed on metolazone.  This was subsequently discontinued.  It was noted that both CardioMEMS and ReDS Vest indicated ongoing volume excess (measurements of 19 and 41% respectively).  Follow-up lab work November 21 did show slight worsening of his renal dysfunction with a creatinine of 2.21, BUN of 70, and potassium of 3.2.  Since his last visit, he notes ongoing but stable dyspnea on exertion.  He has been wearing compression socks and has not had any significant lower extremity edema.  He denies chest pain.  He has been taking torsemide 60 mg twice daily and in general tolerates this dose but is still bothered by some urinary incontinence.  He denies palpitations, PND, orthopnea, dizziness, syncope, or early satiety.  Home Medications    Current Outpatient Medications  Medication Sig Dispense Refill   acetaminophen (TYLENOL) 500 MG tablet Take 1,000 mg by mouth every 6 (six) hours as needed for moderate pain or headache.      Alcohol Swabs (B-D SINGLE USE SWABS REGULAR) PADS Use to check blood sugar up to 2 times daily 200 each 3   allopurinol (ZYLOPRIM) 100 MG tablet Take 1 tablet (100 mg total) by mouth daily. 90 tablet 3   aspirin 81 MG chewable tablet Chew 81 mg by mouth daily.     Blood Glucose Calibration (OT ULTRA/FASTTK CNTRL SOLN) SOLN      Blood Glucose Monitoring Suppl (ONE TOUCH ULTRA 2) w/Device KIT SMARTSIG:Via Meter     bosutinib (BOSULIF) 100 MG tablet Take 100 mg by mouth at bedtime. Take with food.     Cholecalciferol 25 MCG (1000 UT) capsule  Take 1,000 Units by mouth daily.     famotidine (PEPCID) 40 MG tablet Take 1 tablet (40 mg total) by mouth at bedtime. 90 tablet 0   gabapentin (NEURONTIN) 600 MG tablet Take 1 tablet (600 mg total) by mouth 2 (two) times daily. 180 tablet 1   glucose blood (ONETOUCH VERIO) test strip USE TO TEST BLOOD SUGAR EVERY MORNING AND EVERY NIGHT AT BEDTIME 100 strip 0   Insulin Glargine ([name] KWIKPEN) 100 UNIT/ML Inject 22 Units into the skin daily.     Insulin Pen Needle 31G X 5 MM MISC Use to inject insulin daily. 100 each 3   isosorbide mononitrate (IMDUR) 60 MG 24 hr tablet Take 0.5 tablets (30 mg total) by mouth daily. 45 tablet 2  JARDIANCE 10 MG TABS tablet TAKE 1 TABLET(10 MG) BY MOUTH DAILY WITH BREAKFAST 90 tablet 0   Lancets (ONETOUCH DELICA PLUS [id number]) MISC Use to check blood sugar twice daily 200 each 3   levothyroxine (SYNTHROID) 25 MCG tablet TAKE 1 TABLET BY MOUTH EVERY MORNING BEFORE BREAKFAST 90 tablet 0   losartan (COZAAR) 25 MG tablet TAKE 1/2 TABLET(12.5 MG) BY MOUTH DAILY 45 tablet 1   Menthol-Zinc Oxide (GOLD BOND EX) Apply 1 application topically daily as needed (muscle pain).     metolazone (ZAROXOLYN) 2.5 MG tablet Take 1 tablet (2.5 mg total) by mouth once a week. Every Wednesday. Take this medication with potassium 10 tablet 3   mirabegron ER (MYRBETRIQ) 25 MG TB24 tablet Take 1 tablet (25 mg total) by mouth daily. 90 tablet 3   Multiple Vitamin (MULTIVITAMIN WITH MINERALS) TABS tablet Take 1 tablet by mouth daily.     nitroGLYCERIN (NITROSTAT) 0.4 MG SL tablet For chest pain, tightness, or pressure. While sitting, place 1 tablet under tongue. May be used every 5 minutes as needed, for up to 15 minutes. Do not use more than 3 tablets. 25 tablet 1   Polyethyl Glycol-Propyl Glycol (SYSTANE OP) Place 1 drop into both eyes 3 (three) times daily as needed (dry/irritated eyes.).      polyethylene glycol (MIRALAX / GLYCOLAX) 17 g packet Take 17 g by mouth daily as needed for  mild constipation.      potassium chloride SA (KLOR-CON M) 20 MEQ tablet Take 2 tablets (40 mEq total) by mouth once a week. Take with metolazone every Wednesday 10 tablet 3   ranolazine (RANEXA) 500 MG 12 hr tablet TAKE 1 TABLET BY MOUTH TWICE DAILY. PLEASE KEEP UPCOMING APPOINTMENT FOR FUTURE REFILLS 180 tablet 0   rosuvastatin (CRESTOR) 5 MG tablet Take 1 tablet (5 mg total) by mouth daily. 90 tablet 3   tamsulosin (FLOMAX) 0.4 MG CAPS capsule Take 1 capsule (0.4 mg total) by mouth daily after supper. 30 capsule 11   torsemide (DEMADEX) 20 MG tablet Take 3 tablets (60 mg total) by mouth 2 (two) times daily. 240 tablet 3   traMADol (ULTRAM) 50 MG tablet Take 1-2 tablets (50-100 mg total) by mouth at bedtime as needed. 30 tablet 2   warfarin (COUMADIN) 3 MG tablet TAKE 1 TO 1 & 1/2 TABLETS BY MOUTH AS DIRECTED BY THE COUMADIN CLINIC Strength: 3 mg 135 tablet 0   No current facility-administered medications for this visit.     Review of Systems    Chronic, stable dyspnea on exertion.  He denies chest pain, palpitations, PND, orthopnea, dizziness, syncope, edema, or early satiety.  All other systems reviewed and are otherwise negative except as noted above.    Physical Exam    VS:  BP 128/71 (BP Location: Left Arm, Patient Position: Sitting, Cuff Size: Normal)   Pulse 88   Ht _0  (1.626 m)   Wt 196 lb 6.4 oz (89.1 kg)   SpO2 97%   BMI 33.71 kg/m  , BMI Body mass index is 33.71 kg/m.     GEN: Well nourished, well developed, in no acute distress. HEENT: normal. Neck: Supple, no significant JVD, carotid bruits, or masses. Cardiac: RRR, no murmurs, rubs, or gallops. No clubbing, cyanosis, edema.  Radials 2+/PT 2+ and equal bilaterally.  Respiratory:  Respirations regular and unlabored, clear to auscultation bilaterally. GI: Soft, nontender, nondistended, BS + x 4. MS: no deformity or atrophy. Skin: warm and dry, no rash. Neuro:  Strength and sensation are intact. Psych: Normal  affect.  Accessory Clinical Findings    ECG personally reviewed by me today -regular sinus rhythm, 88, left axis deviation, left anterior fascicular block, right bundle branch block, septal infarct- no acute changes.  Lab Results  Component Value Date   WBC 8.9 02/05/2022   HGB 14.3 02/05/2022   HCT 42.5 02/05/2022   MCV 86.0 02/05/2022   PLT 189 02/05/2022   Lab Results  Component Value Date   CREATININE 1.64 (H) 02/20/2022   BUN 29 (H) 02/20/2022   NA 138 02/20/2022   K 4.3 02/20/2022   CL 104 02/20/2022   CO2 27 02/20/2022   Lab Results  Component Value Date   ALT 12 02/05/2022   AST 25 02/05/2022   ALKPHOS 78 02/05/2022   BILITOT 0.8 02/05/2022   Lab Results  Component Value Date   CHOL 156 12/05/2021   HDL 33 (L) 12/05/2021   LDLCALC 92 12/05/2021   TRIG 211 (H) 12/05/2021   CHOLHDL 4.7 12/05/2021    Lab Results  Component Value Date   HGBA1C 8.0 (H) 12/05/2021    Assessment & Plan    1.  Chronic heart failure with reduced ejection fraction/ischemic cardiomyopathy: Most recent echo in November 2023 shows persistent LV dysfunction with an EF of 30 to 35%, global hypokinesis, grade 1 diastolic dysfunction, normal RV function, and mild to moderate mitral regurgitation.  In the setting of elevated CardioMEMS, he was recently trialed on once weekly metolazone however, this resulted in an elevation in BUN and creatinine and he also felt terrible on the drug and it was subsequently discontinued.  He has been taking torsemide 60 mg twice daily and his CardioMEMS today was elevated at 20.  On examination, he does not appear significantly volume overloaded.  His weight is 196 pounds on our scale, which she attributes to heavy shoes and close, as he was between 188 and 190 pounds on his home scale over the past week.  I did follow-up basic metabolic panel which shows return to baseline of his renal function with a BUN of 29 and creatinine of 1.64.  In this setting, I have asked  him to increase his torsemide to 80 mg in the morning and 60 mg in the afternoon.  Hopefully, we can find a happy medium that improves his symptoms but does not necessarily worsen his incontinence, which is a major dissatisfy'er for him.  He remains on ARB and empagliflozin therapy.  No beta-blocker in the setting of prior nightmares.  2.  Coronary disease: Status post circumflex, RCA, and LAD stenting.  No chest pain.  He remains on aspirin, statin, Ranexa, and long-acting nitrate therapy.  3.  Stage III chronic kidney disease: BUN and creatinine stable today at 29 and 1.64.  4.  Hyperlipidemia: LDL of 92 in September with normal LFTs.  LDL L previously as low as 65 in April 2022.  Question overall compliance with rosuvastatin.  Without lifestyle modifications, will need to consider increasing his dose.  5.  Paroxysmal atrial fibrillation: Maintaining sinus rhythm.  Anticoagulated with warfarin.  No beta-blocker in the setting history of night terrors, and is not otherwise on an AV nodal blocking agent.  6.  Type 2 diabetes mellitus: A1c 8.0 in September.  Followed by primary care.  7.  Orthostatic hypotension: No current complaints of orthostasis since discontinuing metolazone.  8.  Peripheral neuropathy: Uses a cane to walk in the setting of unsteady gait.  Overall stable.  9.  Disposition: He had a follow-up basic metabolic panel today, which was stable.  He will be seen in heart failure clinic as scheduled next week for reevaluation.   [name], NP 02/20/2022, 5:45 PM

## 2022-02-20 NOTE — Patient Instructions (Signed)
Medication Instructions:  No changes at this time.   *If you need a refill on your cardiac medications before your next appointment, please call your pharmacy*   Lab Work: BMET today over at the [facility] and check in at registration.  If you have labs (blood work) drawn today and your tests are completely normal, you will receive your results only by: [place] (if you have MyChart) OR A paper copy in the mail If you have any lab test that is abnormal or we need to change your treatment, we will call you to review the results.   Testing/Procedures: None   Follow-Up: At [facility], you and your health needs are our priority.  As part of our continuing mission to provide you with exceptional heart care, we have created designated Provider Care Teams.  These Care Teams include your primary Cardiologist (physician) and Advanced Practice Providers (APPs -  Physician Assistants and Nurse Practitioners) who all work together to provide you with the care you need, when you need it.  We recommend signing up for the patient portal called "MyChart".  Sign up information is provided on this After Visit Summary.  MyChart is used to connect with patients for Virtual Visits (Telemedicine).  Patients are able to view lab/test results, encounter notes, upcoming appointments, etc.  Non-urgent messages can be sent to your provider as well.   To learn more about what you can do with MyChart, go to [email or web address].    Your next appointment:   Follow up with [name]   The format for your next appointment:   In Person    Important Information About Sugar

## 2022-02-21 ENCOUNTER — Telehealth (HOSPITAL_COMMUNITY): Payer: Self-pay | Admitting: Adult Health

## 2022-02-21 NOTE — Telephone Encounter (Signed)
  Cardiomems Remote Monitoring  S/P Cardiomems Implant   PAD Goal: 15 Most recent reading: 24--- suggestive of fluid accumulation.   Recommended changes: Please call . Increase torsemide to 80 mg twice a day x 2 days then back to 60 mg twice a day.  I continue to review and analyze the patients PA pressures weekly (and more often as needed) to bring PA pressures within the optimal range.      Ruhan Borak NP-C 2:27 PM

## 2022-02-21 NOTE — Telephone Encounter (Signed)
Spoke with patient's wife and went over medication changes. She expressed understanding by teach back. He has a follow up appointment on Monday.

## 2022-02-22 ENCOUNTER — Ambulatory Visit (INDEPENDENT_AMBULATORY_CARE_PROVIDER_SITE_OTHER): Payer: Medicare HMO | Admitting: Pharmacist

## 2022-02-22 DIAGNOSIS — Z794 Long term (current) use of insulin: Secondary | ICD-10-CM

## 2022-02-22 DIAGNOSIS — I5022 Chronic systolic (congestive) heart failure: Secondary | ICD-10-CM

## 2022-02-22 NOTE — Chronic Care Management (AMB) (Signed)
Chronic Care Management CCM Pharmacy Note  02/22/2022 Name:  Lucas Caldwell MRN:  [id number] DOB:  October 11, 1933   Subjective: Lucas Caldwell is an 88 y.o. year old male who is a primary patient of [name], DO.  The CCM team was consulted for assistance with disease management and care coordination needs.    Engaged with patient by telephone for follow up visit for pharmacy case management and/or care coordination services.   Objective:  Medications Reviewed Today     Reviewed by Rennis Petty, RPH-CPP (Pharmacist) on 02/22/22 at 1154  Med List Status: <None>   Medication Order Taking? Sig Documenting Provider Last Dose Status Informant  acetaminophen (TYLENOL) 500 MG tablet [id number]  Take 1,000 mg by mouth every 6 (six) hours as needed for moderate pain or headache.  [provider]  Active Spouse/Significant Other  Alcohol Swabs (B-D SINGLE USE SWABS REGULAR) PADS [id number]  Use to check blood sugar up to 2 times daily [name], DO  Active Spouse/Significant Other  allopurinol (ZYLOPRIM) 100 MG tablet [id number]  Take 1 tablet (100 mg total) by mouth daily. Theora Gianotti, NP  Active   aspirin 81 MG chewable tablet [id number]  Chew 81 mg by mouth daily. [provider]  Active Spouse/Significant Other  Blood Glucose Calibration (OT ULTRA/FASTTK CNTRL SOLN) SOLN [id number]   [provider]  Active   Blood Glucose Monitoring Suppl (ONE TOUCH ULTRA 2) w/Device KIT [id number]  SMARTSIG:Via Meter [provider]  Active   bosutinib (BOSULIF) 100 MG tablet [id number]  Take 100 mg by mouth at bedtime. Take with food. [provider]  Active Spouse/Significant Other           Med Note Jeanie Cooks Sep 15, 2019  8:38 AM)    Cholecalciferol 25 MCG (1000 UT) capsule [id number]  Take 1,000 Units by mouth daily. [provider]  Active Spouse/Significant Other  famotidine (PEPCID) 40  MG tablet [id number]  Take 1 tablet (40 mg total) by mouth at bedtime. Karamalegos, [name], DO  Active   gabapentin (NEURONTIN) 600 MG tablet [id number]  Take 1 tablet (600 mg total) by mouth 2 (two) times daily. [name], DO  Active   glucose blood [facility] VERIO) test strip [id number]  USE TO TEST BLOOD SUGAR EVERY MORNING AND EVERY NIGHT AT BEDTIME [name], DO  Active   Insulin Glargine ([name] KWIKPEN) 100 UNIT/ML [id number] Yes Inject 22 Units into the skin daily. [provider] Taking Active   Insulin Pen Needle 31G X 5 MM MISC [id number]  Use to inject insulin daily. Karamalegos, [name], DO  Active   isosorbide mononitrate (IMDUR) 60 MG 24 hr tablet [id number]  Take 0.5 tablets (30 mg total) by mouth daily. [name], MD  Active   JARDIANCE 10 MG TABS tablet [id number] Yes TAKE 1 TABLET(10 MG) BY MOUTH DAILY WITH BREAKFAST [place], [name], DO Taking Active   Lancets (ONETOUCH DELICA PLUS [id number]) Hauser [id number]  Use to check blood sugar twice daily [name], DO  Active   levothyroxine (SYNTHROID) 25 MCG tablet [id number]  TAKE 1 TABLET BY MOUTH EVERY MORNING BEFORE BREAKFAST Karamalegos, Alexander Lenna Sciara, DO  Active   losartan (COZAAR) 25 MG tablet [id number]  TAKE 1/2 TABLET(12.5 MG) BY MOUTH DAILY End, [name], MD  Active   Menthol-Zinc Oxide (GOLD BOND EX) [id number]  Apply 1 application topically daily as needed (muscle pain). [provider]  Active Spouse/Significant Other  metolazone (ZAROXOLYN) 2.5 MG tablet [id number]  Take 1 tablet (2.5 mg total) by mouth once a week. Every Wednesday. Take this medication with potassium Bensimhon, Shaune Pascal, MD  Active   mirabegron ER (MYRBETRIQ) 25 MG TB24 tablet [id number]  Take 1 tablet (25 mg total) by mouth daily. Debroah Loop, PA-C  Active   Multiple Vitamin (MULTIVITAMIN WITH MINERALS) TABS tablet [id number]  Take 1 tablet by mouth daily. [provider]  Active Spouse/Significant Other  nitroGLYCERIN (NITROSTAT) 0.4 MG SL tablet [id number]  For chest pain, tightness, or pressure. While sitting, place 1 tablet under tongue. May be used every 5 minutes as needed, for up to 15 minutes. Do not use more than 3 tablets. End, [name], MD  Active   Polyethyl Glycol-Propyl Glycol [facility] OP) [id number]  Place 1 drop into both eyes 3 (three) times daily as needed (dry/irritated eyes.).  [provider]  Active Spouse/Significant Other  polyethylene glycol (MIRALAX / GLYCOLAX) 17 g packet [id number]  Take 17 g by mouth daily as needed for mild constipation.  [provider]  Active Spouse/Significant Other  potassium chloride SA (KLOR-CON M) 20 MEQ tablet [id number]  Take 2 tablets (40 mEq total) by mouth once a week. Take with metolazone every Wednesday Bensimhon, Shaune Pascal, MD  Active   ranolazine (RANEXA) 500 MG 12 hr tablet [id number]  TAKE 1 TABLET BY MOUTH TWICE DAILY. PLEASE KEEP UPCOMING APPOINTMENT FOR FUTURE REFILLS End, [name], MD  Active   rosuvastatin (CRESTOR) 5 MG tablet [id number] Yes Take 1 tablet (5 mg total) by mouth daily. End, [name], MD Taking Active   tamsulosin (FLOMAX) 0.4 MG CAPS capsule [id number]  Take 1 capsule (0.4 mg total) by mouth daily after supper. Rise Mu, PA-C  Active Spouse/Significant Other  torsemide (DEMADEX) 20 MG tablet [id number]  Take 3 tablets (60 mg total) by mouth 2 (two) times daily. Bensimhon, Shaune Pascal, MD  Active   traMADol (ULTRAM) 50 MG tablet [id number]  Take 1-2 tablets (50-100 mg total) by mouth at bedtime as needed. [name], DO  Active Spouse/Significant Other  warfarin (COUMADIN) 3 MG tablet [id number]  TAKE 1 TO 1 & 1/2 TABLETS BY MOUTH AS DIRECTED BY THE COUMADIN CLINIC Strength: 3 mg End, [name], MD  Active             Pertinent Labs:  Lab Results  Component Value Date   HGBA1C 8.0 (H) 12/05/2021   Lab Results  Component Value  Date   CHOL 156 12/05/2021   HDL 33 (L) 12/05/2021   LDLCALC 92 12/05/2021   TRIG 211 (H) 12/05/2021   CHOLHDL 4.7 12/05/2021   Lab Results  Component Value Date   CREATININE 1.64 (H) 02/20/2022   BUN 29 (H) 02/20/2022   NA 138 02/20/2022   K 4.3 02/20/2022   CL 104 02/20/2022   CO2 27 02/20/2022   BP Readings from Last 3 Encounters:  02/20/22 128/71  02/05/22 130/74  01/23/22 (!) 104/58   Pulse Readings from Last 3 Encounters:  02/20/22 88  02/05/22 88  01/23/22 81     SDOH:  (Social Determinants of Health) assessments and interventions performed:  SDOH Interventions    Flowsheet Row Chronic Care Management from 01/28/2022 in [facility] Coordination from 11/05/2021 in [place] Coordination Office Visit from 10/05/2021 in [place] from 09/03/2021 in [place] Management from 10/02/2020 in [place]  [facility] Office Visit from 09/08/2019 in [place]  SDOH Interventions        Food Insecurity Interventions -- Intervention Not Indicated -- Intervention Not Indicated -- --  Housing Interventions -- Intervention Not Indicated -- Intervention Not Indicated -- --  Transportation Interventions -- Intervention Not Indicated -- Intervention Not Indicated -- --  Utilities Interventions Intervention Not Indicated -- -- -- -- --  Depression Interventions/Treatment  -- -- Counseling -- -- Counseling  Financial Strain Interventions -- Intervention Not Indicated -- Intervention Not Indicated -- --  Physical Activity Interventions -- Other (Comments)  [gets really short of breath due to chronic conditions] -- -- Other (Comments)  [gets easily short of breath. Limited to the activity he can do] --  Stress Interventions -- -- -- Intervention Not Indicated -- --  Social Connections Interventions -- -- -- Intervention Not Indicated -- --       CCM Care  Plan  Review of patient past medical history, allergies, medications, health status, including review of consultants reports, laboratory and other test data, was performed as part of comprehensive evaluation and provision of chronic care management services.   Care Plan : PharmD - Medication Management/Assistance  Updates made by Rennis Petty, RPH-CPP since 02/22/2022 12:00 AM     Problem: Disease Progression      Long-Range Goal: Disease Progression Prevented or Minimized   Start Date: 04/14/2020  Expected End Date: 07/13/2020  Recent Progress: On track  Priority: High  Note:   Current Barriers:  Chronic Disease Management support, education, and care coordination needs related to type 2 diabetes, CKD, HFrEF s/p CardioMEMS, hyperlipidemia, atrial fibrillation, hypertension, coronary artery disease, CHF, hypothyroidism, and CML Financial Note patient has Partial Extra Help/LIS Subsidy Patient enrolled in [facility] patient assistance program for 2023 calendar year Patient denied for patient assistance for [place] from [facility] for 2022 calendar year Patient previously enrolled in [place] for [employer] copayment assistance, but eligibility ended as not in use ($0 copayment through Part D plan coverage for Jardiance) Limited vision  Pharmacist Clinical Goal(s):  Over the next 90 days, patient will verbalize ability to afford treatment regimen through collaboration with PharmD and provider.   Interventions: 1:1 collaboration with [name], DO regarding development and update of comprehensive plan of care as evidenced by provider attestation and co-signature Inter-disciplinary care team collaboration (see longitudinal plan of care) Perform chart review. Per Telephone call note from [place] [facility] clinic on 12/7 regarding Cardiomems Remote Monitoring, patient advised to increase torsemide to 80 mg twice a day x 2 days then  back to 60 mg twice a day  Today spouse confirms following direction from Heart Failure clinic for torsemide dosing and then plans to follow up for office visit with [name] as scheduled on 12/11   Type 2 Diabetes: Current treatment: [name] 22 units daily Jardiance 10 mg once daily in morning Previous therapies tried: Trulicity (stopped due to concern could be contributing to diarrhea) Reports recent fasting blood sugars readings:    Morning Fasting Blood Sugar  2 - December 140  3 - December 160  4 - December 173  5 - December 170  6 - December 181  7 - December 186  8 - December 186  Attributes recent elevated morning readings to patient eating popsicles at bedtime Counseled on importance of having regular well-balanced meals and snacks, while controlling carbohydrate portion sizes Discuss alternative lower carbohydrate bedtime snack ideas Denies  hypoglycemia Have counseled on signs of hypoglycemia and how to manage low blood sugar Exercise: walks with walker/cane ~10 minutes most days of the week Have discussed importance of fall prevention/consistently using walker Counsel to continue to monitor home blood sugar, keep a log of results, bring this record to medical appointments and call office sooner if needed for readings outside of established parameters or symptoms Statin: rosuvastatin 5 mg daily Remind patient's wife to follow up with [facility] [phone]) when patient in need of further diabetes testing supplies Counsel to continue to monitor home blood sugar, keep log of results, have this record to review during upcoming appointments and to call office sooner if needed for readings outside of established parameters or for new symptoms Agree to follow up in 3 weeks to review blood sugar readings/follow up regarding diabetes medication management   Medication Assistance: Collaborating with [place] for assistance to patient with [employer] patient  assistance program application for 8413 calendar year  Patient Goals/Self-Care Activities Over the next 90 days, patient will:  Check blood sugar, blood pressure and fluid status as directed by providers  Take medications, with assistance of wife, as directed Mrs. Diviney manages patient's medications using weekly pillbox as adherence tool Attends scheduled medical appointments        Plan: Telephone follow up appointment with care management team member scheduled for:  12/29 at 8:30 am  [name], PharmD, [place], [facility] [phone]

## 2022-02-22 NOTE — Patient Instructions (Signed)
Visit Information  Thank you for taking time to visit with me today. Please don't hesitate to contact me if I can be of assistance to you before our next scheduled telephone appointment.  Following are the goals we discussed today:   Goals Addressed             This Visit's Progress    Pharmacy Goals       Please check your blood sugar, keep a log of the results and bring this with you to your medical appointments.  Please check your home blood pressure, keep a log of the results and bring this with you to your medical appointments.  Our goal bad cholesterol, or LDL, is less than 70 . This is why it is important to continue taking your rosuvastatin.  Feel free to call me with any questions or concerns. I look forward to our next call!  [name], PharmD, [place], CPP Clinical Pharmacist [facility] [phone]          Our next appointment is by telephone on 12/29 at 8:30 am  Please call the care guide team at [phone] if you need to cancel or reschedule your appointment.    The patient verbalized understanding of instructions, educational materials, and care plan provided today and DECLINED offer to receive copy of patient instructions, educational materials, and care plan.

## 2022-02-24 ENCOUNTER — Emergency Department: Payer: Medicare HMO

## 2022-02-24 ENCOUNTER — Emergency Department
Admission: EM | Admit: 2022-02-24 | Discharge: 2022-02-25 | Disposition: A | Discharge status: Home / Self Care | Payer: Medicare HMO | Attending: Emergency Medicine | Admitting: Emergency Medicine

## 2022-02-24 ENCOUNTER — Other Ambulatory Visit: Payer: Self-pay

## 2022-02-24 DIAGNOSIS — N189 Chronic kidney disease, unspecified: Secondary | ICD-10-CM | POA: Insufficient documentation

## 2022-02-24 DIAGNOSIS — I509 Heart failure, unspecified: Secondary | ICD-10-CM | POA: Insufficient documentation

## 2022-02-24 DIAGNOSIS — I959 Hypotension, unspecified: Secondary | ICD-10-CM | POA: Diagnosis not present

## 2022-02-24 DIAGNOSIS — R29898 Other symptoms and signs involving the musculoskeletal system: Secondary | ICD-10-CM | POA: Insufficient documentation

## 2022-02-24 DIAGNOSIS — R000 Tachycardia, unspecified: Secondary | ICD-10-CM | POA: Diagnosis not present

## 2022-02-24 DIAGNOSIS — Z743 Need for continuous supervision: Secondary | ICD-10-CM | POA: Diagnosis not present

## 2022-02-24 DIAGNOSIS — R410 Disorientation, unspecified: Secondary | ICD-10-CM | POA: Diagnosis not present

## 2022-02-24 DIAGNOSIS — R4182 Altered mental status, unspecified: Secondary | ICD-10-CM | POA: Diagnosis not present

## 2022-02-24 DIAGNOSIS — Z20822 Contact with and (suspected) exposure to covid-19: Secondary | ICD-10-CM | POA: Diagnosis not present

## 2022-02-24 DIAGNOSIS — R531 Weakness: Secondary | ICD-10-CM | POA: Diagnosis not present

## 2022-02-24 DIAGNOSIS — I2510 Atherosclerotic heart disease of native coronary artery without angina pectoris: Secondary | ICD-10-CM | POA: Diagnosis not present

## 2022-02-24 LAB — URINALYSIS, ROUTINE W REFLEX MICROSCOPIC
Bacteria, UA: NONE SEEN
Bilirubin Urine: NEGATIVE
Glucose, UA: >=500 mg/dL — AB
Hgb urine dipstick: NEGATIVE
Ketones, ur: NEGATIVE mg/dL
Leukocytes,Ua: NEGATIVE
Nitrite: NEGATIVE
Protein, ur: NEGATIVE mg/dL
Specific Gravity, Urine: 1.016 (ref 1.005–1.030)
pH: 6 (ref 5.0–8.0)

## 2022-02-24 LAB — CBC
HCT: 41.6 % (ref 39.0–52.0)
Hemoglobin: 13.4 g/dL (ref 13.0–17.0)
MCH: 28.6 pg (ref 26.0–34.0)
MCHC: 32.2 g/dL (ref 30.0–36.0)
MCV: 88.7 fL (ref 80.0–100.0)
Platelets: 197 10*3/uL (ref 150–400)
RBC: 4.69 MIL/uL (ref 4.22–5.81)
RDW: 14.3 % (ref 11.5–15.5)
WBC: 9 10*3/uL (ref 4.0–10.5)
nRBC: 0 % (ref 0.0–0.2)

## 2022-02-24 LAB — BASIC METABOLIC PANEL
Anion gap: 10 (ref 5–15)
BUN: 42 mg/dL — ABNORMAL HIGH (ref 8–23)
CO2: 23 mmol/L (ref 22–32)
Calcium: 9.5 mg/dL (ref 8.9–10.3)
Chloride: 104 mmol/L (ref 98–111)
Creatinine, Ser: 2.2 mg/dL — ABNORMAL HIGH (ref 0.61–1.24)
GFR, Estimated: 28 mL/min — ABNORMAL LOW (ref >60)
Glucose, Bld: 159 mg/dL — ABNORMAL HIGH (ref 70–99)
Potassium: 4.3 mmol/L (ref 3.5–5.1)
Sodium: 137 mmol/L (ref 135–145)

## 2022-02-24 LAB — PROTIME-INR
INR: 2.3 — ABNORMAL HIGH (ref 0.8–1.2)
Prothrombin Time: 25.1 seconds — ABNORMAL HIGH (ref 11.4–15.2)

## 2022-02-24 LAB — TROPONIN I (HIGH SENSITIVITY)
Troponin I (High Sensitivity): 28 ng/L — ABNORMAL HIGH (ref <18)
Troponin I (High Sensitivity): 27 ng/L — ABNORMAL HIGH (ref <18)

## 2022-02-24 NOTE — ED Provider Notes (Incomplete)
[facility] Provider Note    Event Date/Time   First MD Initiated Contact with Patient 02/24/22 2338     (approximate)   History   Altered Mental Status   HPI  Lucas Caldwell is a 88 y.o. male who presents to the ED for evaluation of Altered Mental Status   Review of cardiology clinic visit from 4 days ago.  History of CAD s/p stenting and associated ischemic cardiomyopathy, paroxysmal A-fib on warfarin, CKD, CML.  Most recent echo last month with EF of 68% and diastolic dysfunction.     Physical Exam   Triage Vital Signs: ED Triage Vitals  Enc Vitals Group     BP 02/24/22 1825 108/67     Pulse Rate 02/24/22 1825 (!) 108     Resp 02/24/22 1825 18     Temp 02/24/22 2232 98.3 F (36.8 C)     Temp Source 02/24/22 2232 Oral     SpO2 02/24/22 1825 97 %     Weight 02/24/22 1825 196 lb (88.9 kg)     Height 02/24/22 1825 '5\' 4"'$  (1.626 m)     Head Circumference --      Peak Flow --      Pain Score 02/24/22 1825 0     Pain Loc --      Pain Edu? --      Excl. in [place]? --     Most recent vital signs: Vitals:   02/24/22 1825 02/24/22 2232  BP: 108/67 (!) 106/93  Pulse: (!) 108 94  Resp: 18 18  Temp:  98.3 F (36.8 C)  SpO2: 97% 95%    General: Awake, no distress. *** CV:  Good peripheral perfusion.  Resp:  Normal effort.  Abd:  No distention.  MSK:  No deformity noted.  Neuro:  No focal deficits appreciated. Other:     ED Results / Procedures / Treatments   Labs (all labs ordered are listed, but only abnormal results are displayed) Labs Reviewed  URINALYSIS, ROUTINE W REFLEX MICROSCOPIC - Abnormal; Notable for the following components:      Result Value   Color, Urine YELLOW (*)    APPearance CLEAR (*)    Glucose, UA >=500 (*)    All other components within normal limits  BASIC METABOLIC PANEL - Abnormal; Notable for the following components:   Glucose, Bld 159 (*)    BUN 42 (*)    Creatinine, Ser 2.20 (*)    GFR, Estimated  28 (*)    All other components within normal limits  PROTIME-INR - Abnormal; Notable for the following components:   Prothrombin Time 25.1 (*)    INR 2.3 (*)    All other components within normal limits  TROPONIN I (HIGH SENSITIVITY) - Abnormal; Notable for the following components:   Troponin I (High Sensitivity) 28 (*)    All other components within normal limits  URINE CULTURE  CBC  TROPONIN I (HIGH SENSITIVITY)    EKG ***  RADIOLOGY ***  Official radiology report(s): No results found.  PROCEDURES and INTERVENTIONS:  Procedures  Medications - No data to display   IMPRESSION / MDM / [place] / ED COURSE  I reviewed the triage vital signs and the nursing notes.  Differential diagnosis includes, but is not limited to, ***  {Patient presents with symptoms of an acute illness or injury that is potentially life-threatening.}      FINAL CLINICAL IMPRESSION(S) / ED DIAGNOSES   Final diagnoses:  None     Rx / DC Orders   ED Discharge Orders     None        Note:  This document was prepared using Dragon voice recognition software and may include unintentional dictation errors.

## 2022-02-24 NOTE — ED Provider Notes (Signed)
[facility] Provider Note    Event Date/Time   First MD Initiated Contact with Patient 02/24/22 2338     (approximate)   History   Altered Mental Status   HPI  Lucas Caldwell is a 88 y.o. male who presents to the ED for evaluation of Altered Mental Status   Review of cardiology clinic visit from 4 days ago.  History of CAD s/p stenting and associated ischemic cardiomyopathy, paroxysmal A-fib on warfarin, CKD, CML.  Most recent echo last month with EF of 44% and diastolic dysfunction.  Patient presents to the ED, accompanied by his wife, for evaluation of bilateral leg weakness over the past 2-4 weeks.  He reports primary concern that his legs are weak bilaterally.  [name] typically ambulatory with a cane, and he is still able to do so.  He denies any falls or injuries.  Denies any laterality or focal weakness.  Denies any other neurologic symptoms such as vision changes, headache, upper extremity weakness or sensation changes throughout.  Of note, he is triaged as having "altered mental status" and he reports that he "moves slow in the morning" before he "gets improper sleep."  He reports he sometimes does not sleep well at night and feels more weak in the morning until he takes a nap.  Furthermore, he has a clinic visit tomorrow, he thinks with his nephrologist, as well as a PCP visit next week.  He has no other particular concerns.  One of his other physicians already referred him to physical therapy, but this has not started yet.  Reports he wanted to get checked out because today it seemed a little bit worse.  Again, he was triaged as having "foul-smelling urine."  He reports that his urinating has been fine he tells me that his diuretic use.  Does report occasional constipation for which she takes MiraLAX at home.   Physical Exam   Triage Vital Signs: ED Triage Vitals  Enc Vitals Group     BP 02/24/22 1825 108/67     Pulse Rate 02/24/22 1825 (!)  108     Resp 02/24/22 1825 18     Temp 02/24/22 2232 98.3 F (36.8 C)     Temp Source 02/24/22 2232 Oral     SpO2 02/24/22 1825 97 %     Weight 02/24/22 1825 196 lb (88.9 kg)     Height 02/24/22 1825 '5\' 4"'$  (1.626 m)     Head Circumference --      Peak Flow --      Pain Score 02/24/22 1825 0     Pain Loc --      Pain Edu? --      Excl. in [place]? --     Most recent vital signs: Vitals:   02/24/22 2232 02/25/22 0119  BP: (!) 106/93 102/86  Pulse: 94 89  Resp: 18 18  Temp: 98.3 F (36.8 C)   SpO2: 95% 94%    General: Awake, no distress.  Looks systemically well.  Pleasant and conversational.  Blind in the right eye.  Somewhat hard of hearing.  With minimal assistance he is able to sit up in the side of the bed, stand and walk around the stretcher with a slow and somewhat shuffling gait.  Wife reports that he looks good and he reports feeling much better now after he took a nap. CV:  Good peripheral perfusion.  Resp:  Normal effort.  Abd:  No distention.  Soft and  benign throughout.  No suprapubic tenderness. MSK:  No deformity noted.  Palpation of all 4 extremities and the back without signs of tenderness, deformity or trauma.  No lower extremity swelling appreciated. Neuro:  No focal deficits appreciated. Other:     ED Results / Procedures / Treatments   Labs (all labs ordered are listed, but only abnormal results are displayed) Labs Reviewed  RESP PANEL BY RT-PCR (FLU A&B, COVID) ARPGX2 - Abnormal; Notable for the following components:      Result Value   Influenza A by PCR POSITIVE (*)    All other components within normal limits  URINALYSIS, ROUTINE W REFLEX MICROSCOPIC - Abnormal; Notable for the following components:   Color, Urine YELLOW (*)    APPearance CLEAR (*)    Glucose, UA >=500 (*)    All other components within normal limits  BASIC METABOLIC PANEL - Abnormal; Notable for the following components:   Glucose, Bld 159 (*)    BUN 42 (*)    Creatinine, Ser  2.20 (*)    GFR, Estimated 28 (*)    All other components within normal limits  PROTIME-INR - Abnormal; Notable for the following components:   Prothrombin Time 25.1 (*)    INR 2.3 (*)    All other components within normal limits  BRAIN NATRIURETIC PEPTIDE - Abnormal; Notable for the following components:   B Natriuretic Peptide 222.1 (*)    All other components within normal limits  HEPATIC FUNCTION PANEL - Abnormal; Notable for the following components:   Total Protein 8.4 (*)    All other components within normal limits  TROPONIN I (HIGH SENSITIVITY) - Abnormal; Notable for the following components:   Troponin I (High Sensitivity) 28 (*)    All other components within normal limits  TROPONIN I (HIGH SENSITIVITY) - Abnormal; Notable for the following components:   Troponin I (High Sensitivity) 27 (*)    All other components within normal limits  TROPONIN I (HIGH SENSITIVITY) - Abnormal; Notable for the following components:   Troponin I (High Sensitivity) 27 (*)    All other components within normal limits  URINE CULTURE  CBC  AMMONIA    EKG   RADIOLOGY CT head interpreted by me without evidence of acute intracranial pathology CXR interpreted by me without evidence of acute cardiopulmonary pathology.  Official radiology report(s): CT HEAD WO CONTRAST (5MM)  Result Date: 02/25/2022 CLINICAL DATA:  Altered mental status. Awoke with weakness. EXAM: CT HEAD WITHOUT CONTRAST TECHNIQUE: Contiguous axial images were obtained from the base of the skull through the vertex without intravenous contrast. RADIATION DOSE REDUCTION: This exam was performed according to the departmental dose-optimization program which includes automated exposure control, adjustment of the mA and/or kV according to patient size and/or use of iterative reconstruction technique. COMPARISON:  None Available. FINDINGS: Brain: Generalized cerebral atrophy. Ventricular prominence likely due to central atrophy. No  acute hemorrhage, hydrocephalus, midline shift or mass effect. No evidence of acute ischemia. No subdural or extra-axial collection. Vascular: Atherosclerosis of skullbase vasculature without hyperdense vessel or abnormal calcification. Skull: No fracture or focal lesion. Sinuses/Orbits: Minimal opacification of lower left mastoid air cells. Paranasal sinuses are clear. Bilateral cataract resection. Other: Unremarkable scalp soft tissues. IMPRESSION: 1. No acute intracranial abnormality. 2. Generalized atrophy and chronic small vessel ischemia. Electronically Signed   By: Keith Rake M.D.   On: 02/25/2022 00:19   DG Chest Portable 1 View  Result Date: 02/25/2022 CLINICAL DATA:  Weakness EXAM: PORTABLE CHEST 1 VIEW  COMPARISON:  12/11/2021 FINDINGS: Cardiac shadow is mildly prominent. Aortic calcifications are noted. CardioMEMS device is noted in place. Lungs are clear bilaterally. No bony abnormality is seen. IMPRESSION: No active disease. Electronically Signed   By: Inez Catalina M.D.   On: 02/25/2022 00:03    PROCEDURES and INTERVENTIONS:  Procedures  Medications - No data to display   IMPRESSION / MDM / [place] / ED COURSE  I reviewed the triage vital signs and the nursing notes.  Differential diagnosis includes, but is not limited to, dehydration, stroke, sepsis, UTI, deconditioning  {Patient presents with symptoms of an acute illness or injury that is potentially life-threatening.  Pleasant 88 year old male presents from home with progressive subacute sensation of lower extremity weakness and disruption of day/night cycle.  He looks quite well to me and has no complaints by the time I see him.  [name] ambulatory at his baseline with me.  Has a fairly benign workup with CKD around his baseline.  Urine without clear infectious features and we will send for a culture while holding off on antibiotics at this point considering his inconsistent reports of urinary symptoms.  CBC is  normal and his INR is therapeutic.  His troponin is noted to be slightly elevated, possibly a consequence of his CKD and we will send for repeat as well as get an EKG.  Imaging of his head and chest are reassuring.  Will add a COVID/flu swab, repeat the troponin, get the EKG and reassess.  Possibly suitable for outpatient management later.   After he was swabbed for flu/COVID and the second troponin was sent, patient is demanding to leave.  Only to wait for these unwilling to have an EKG performed.  Most of the miscommunication.  I did tell him that we will take about an hour for these subsequent tests, but he and his wife had immediately called their grandson who is now here and ready to take the patient home.  After the time of discharge his respiratory swab did return with influenza A and I suspect this is the etiology of his symptoms.  It is unfortunate that we did not get an EKG done, but I urged him to return with any focal weakness, chest discomfort, breathing difficulties alongside my typical and customary return precautions.     FINAL CLINICAL IMPRESSION(S) / ED DIAGNOSES   Final diagnoses:  Bilateral leg weakness     Rx / DC Orders   ED Discharge Orders     None        Note:  This document was prepared using Dragon voice recognition software and may include unintentional dictation errors.   Vladimir Crofts, MD 02/25/22 [phone]

## 2022-02-24 NOTE — ED Triage Notes (Signed)
Pt to ED via [facility] from home. Pt woke this morning with weakness and acting a little different per wife. Pt does have shakiness at baseline. Pt states having pain when urinating. LKW last night, oriented to self and situation. Per EMS pt has foul smell to urine and afib on monitor. Pt takes coumadin.   EMS vitals 135/54 95% RA 99HR 175 CBG

## 2022-02-24 NOTE — ED Notes (Signed)
Attempted to have pt provide urine sample, was unable to void at this time.

## 2022-02-25 ENCOUNTER — Other Ambulatory Visit: Payer: Self-pay | Admitting: Internal Medicine

## 2022-02-25 ENCOUNTER — Encounter: Payer: Medicare HMO | Admitting: Internal Medicine

## 2022-02-25 ENCOUNTER — Encounter: Payer: Self-pay | Admitting: Family Medicine

## 2022-02-25 ENCOUNTER — Ambulatory Visit: Payer: Self-pay | Admitting: *Deleted

## 2022-02-25 ENCOUNTER — Ambulatory Visit (INDEPENDENT_AMBULATORY_CARE_PROVIDER_SITE_OTHER): Payer: Medicare HMO | Admitting: Family Medicine

## 2022-02-25 VITALS — Ht 64.0 in | Wt 196.0 lb

## 2022-02-25 DIAGNOSIS — R531 Weakness: Secondary | ICD-10-CM | POA: Diagnosis not present

## 2022-02-25 DIAGNOSIS — J101 Influenza due to other identified influenza virus with other respiratory manifestations: Secondary | ICD-10-CM

## 2022-02-25 DIAGNOSIS — R4182 Altered mental status, unspecified: Secondary | ICD-10-CM | POA: Diagnosis not present

## 2022-02-25 LAB — HEPATIC FUNCTION PANEL
ALT: 12 U/L (ref 0–44)
AST: 20 U/L (ref 15–41)
Albumin: 4.2 g/dL (ref 3.5–5.0)
Alkaline Phosphatase: 61 U/L (ref 38–126)
Bilirubin, Direct: <0.1 mg/dL (ref 0.0–0.2)
Total Bilirubin: 0.8 mg/dL (ref 0.3–1.2)
Total Protein: 8.4 g/dL — ABNORMAL HIGH (ref 6.5–8.1)

## 2022-02-25 LAB — RESP PANEL BY RT-PCR (FLU A&B, COVID) ARPGX2
Influenza A by PCR: POSITIVE — AB
Influenza B by PCR: NEGATIVE
SARS Coronavirus 2 by RT PCR: NEGATIVE

## 2022-02-25 LAB — TROPONIN I (HIGH SENSITIVITY): Troponin I (High Sensitivity): 27 ng/L — ABNORMAL HIGH (ref <18)

## 2022-02-25 LAB — URINE CULTURE: Culture: <10000 — AB

## 2022-02-25 LAB — BRAIN NATRIURETIC PEPTIDE: B Natriuretic Peptide: 222.1 pg/mL — ABNORMAL HIGH (ref 0.0–100.0)

## 2022-02-25 MED ORDER — OSELTAMIVIR PHOSPHATE 75 MG PO CAPS: 75.0000 mg | ORAL_CAPSULE | Freq: Every day | ORAL | 0 refills | Status: DC

## 2022-02-25 MED ORDER — OSELTAMIVIR PHOSPHATE 75 MG PO CAPS: 75.0000 mg | ORAL_CAPSULE | Freq: Two times a day (BID) | ORAL | 0 refills | Status: DC

## 2022-02-25 NOTE — Telephone Encounter (Signed)
Reason for Disposition  [1] HIGH RISK patient (e.g., weak immune system, age > 64 years, obesity with BMI 30 or higher, pregnant, chronic lung disease or other chronic medical condition) AND [2] COVID symptoms (e.g., cough, fever)  (Exceptions: Already seen by PCP and no new or worsening symptoms.)    Has appt for today at 2:40  Answer Assessment - Initial Assessment Questions 1. COVID-19 DIAGNOSIS: "How do you know that you have COVID?" (e.g., positive lab test or self-test, diagnosed by doctor or NP/PA, symptoms after exposure).     Wife    Covid positive.  Hospital called and said he was positive.   He probably got it from a funeral he went to. 2. COVID-19 EXPOSURE: "Was there any known exposure to [place] before the symptoms began?" CDC Definition of close contact: within 6 feet (2 meters) for a total of 15 minutes or more over a 24-hour period.      Went to a funeral 3. ONSET: "When did the COVID-19 symptoms start?"      I went over the isolation protocol and let her know the medications she was referring to would be decided by the dr. Today at their 2:40 appt.    No further triage needed. 4. WORST SYMPTOM: "What is your worst symptom?" (e.g., cough, fever, shortness of breath, muscle aches)      5. COUGH: "Do you have a cough?" If Yes, ask: "How bad is the cough?"        6. FEVER: "Do you have a fever?" If Yes, ask: "What is your temperature, how was it measured, and when did it start?"      7. RESPIRATORY STATUS: "Describe your breathing?" (e.g., normal; shortness of breath, wheezing, unable to speak)       8. BETTER-SAME-WORSE: "Are you getting better, staying the same or getting worse compared to yesterday?"  If getting worse, ask, "In what way?"      9. OTHER SYMPTOMS: "Do you have any other symptoms?"  (e.g., chills, fatigue, headache, loss of smell or taste, muscle pain, sore throat)      10. HIGH RISK DISEASE: "Do you have any chronic medical problems?" (e.g., asthma, heart or lung  disease, weak immune system, obesity, etc.)        11. VACCINE: "Have you had the COVID-19 vaccine?" If Yes, ask: "Which one, how many shots, when did you get it?"        12. PREGNANCY: "Is there any chance you are pregnant?" "When was your last menstrual period?"        13. O2 SATURATION MONITOR:  "Do you use an oxygen saturation monitor (pulse oximeter) at home?" If Yes, ask "What is your reading (oxygen level) today?" "What is your usual oxygen saturation reading?" (e.g., 95%)  Protocols used: Coronavirus (COVID-19) Diagnosed or Suspected-A-AH

## 2022-02-25 NOTE — Progress Notes (Signed)
Virtual Visit via Telephone The purpose of this virtual visit is to provide medical care while limiting exposure to the novel coronavirus (COVID19) for both patient and office staff.  Consent was obtained for phone visit:  Yes.   Answered questions that patient had about telehealth interaction:  Yes.   I discussed the limitations, risks, security and privacy concerns of performing an evaluation and management service by telephone. I also discussed with the patient that there may be a patient responsible charge related to this service. The patient expressed understanding and agreed to proceed.  Patient Location: Home Provider Location: [facility] (Office)  Participants in virtual visit: - Patient: Lucas Caldwell and wife Lucas Caldwell - CMA: Lucas Caldwell, Lucas Caldwell - Provider: Dr Lucas Caldwell  ---------------------------------------------------------------------- Chief Complaint  Patient presents with   Positive flu   Cough   Sore Throat    S: Reviewed CMA documentation. I have called patient and gathered additional HPI as follows:  Influenza A Reports that symptoms started Saturday now worsening with constellation with cough congestion fever they tested positive for Influenza - Tried OTC medications  Wife without flu symptoms but exposed  Denies any known or suspected exposure to person with or possibly with COVID19.  Denies any shortness of breath, sinus pain or pressure, headache, abdominal pain, diarrhea  Past Medical History:  Diagnosis Date   Arthritis    "probably in his legs" (08/15/2016)   Blind right eye    BPH (benign prostatic hyperplasia)    Breast asymmetry    Left breast is larger, present for several years.   CAD (coronary artery disease)    a. Cath in the late 90's - reportedly ok;  b. 2014 s/p stenting x 2 @ [facility]; c 08/19/16 Cath/PCI with DES -> RCA, plan to treat LM 70% medically. Seen by surgery and felt to be too high risk for CABG; d.  08/2018 Cath: LM 70 (iFR 0.86), LAD 20ost, 40p, 50/67m D1 70, LCX patent stent, OM1 30, RCA patent stent, 438mSR, 40d, RPDA 30. PCWP 8. CO/CI 4.0/2.1; e. 03/2019 PCI to LAD (2.5x15 Resolute Onyx DES).   Chronic combined systolic (congestive) and diastolic (congestive) heart failure ([place]   a. Previously reduced EF-->50% by echo in 2012;  b. 06/2015 Echo: EF 50-55%  c. 07/2016 Echo: EF 45-50%; d. 12/2017 Echo: EF 55%; e. 08/2018 Echo: EF 35-40%; f. 04/2019 Echo: EF 30-35%; g. s/p cardiomems; h. 10/2020 Echo: EF 30-35%; i. 01/2022 Echo: EF 30-35%, glob HK, GrII DD, nl rV fxn, mild to mod MR.   Chronic kidney disease (CKD), stage IV (severe) (HCC)    CML (chronic myelocytic leukemia) (HCC)    GERD (gastroesophageal reflux disease)    GIB (gastrointestinal bleeding)    a. 12/2017 3 unit PRBC GIB in setting of coumadin-->Endo/colon multiple duodenal ulcers and a single bleeding ulcer in the proximal ascending colon status post hemostatic clipping x2.   Gout    Hyperlipemia    Hypertension    Hypothyroidism    Iron deficiency anemia    Ischemic cardiomyopathy    a. Previously reduced EF-->50% by echo in 2012;  b. 06/2015 Echo: EF 50-55%, Gr2 DD;  c. 07/2016 Echo: EF 45-50%; d. 12/2017 Echo: EF 55%, Gr1 DD, mild MR; e. 08/2018 Echo: EF 35-40%; f. 04/2019 Echo: EF 30-35%; g. 10/2020 Echo: EF 30-35%; h. Echo: EF 30-35%   Migraine    "in the 1960s" (08/15/2016)   Obstructive sleep apnea    Orthostatic hypotension  a. 20222 -->carvedilol and hydralazine d/c'd.   PAF (paroxysmal atrial fibrillation) (HCC)    a. ? Dx 2014-->s/p DCCV;  b. CHA2DS2VASc = 6--> Coumadin.   Prostate cancer ([place])    RBBB    Type II diabetes mellitus ([place])    Social History   Tobacco Use   Smoking status: Former    Packs/day: 1.75    Years: 20.00    Total pack years: 35.00    Types: Cigarettes    Start date: 73    Quit date: 1975    Years since quitting: 48.9   Smokeless tobacco: Former  Scientific laboratory technician Use: Never  used  Substance Use Topics   Alcohol use: No    Comment: previously drank heavily - quit 1979.   Drug use: No    Current Outpatient Medications:    acetaminophen (TYLENOL) 500 MG tablet, Take 1,000 mg by mouth every 6 (six) hours as needed for moderate pain or headache. , Disp: , Rfl:    Alcohol Swabs (B-D SINGLE USE SWABS REGULAR) PADS, Use to check blood sugar up to 2 times daily, Disp: 200 each, Rfl: 3   allopurinol (ZYLOPRIM) 100 MG tablet, Take 1 tablet (100 mg total) by mouth daily., Disp: 90 tablet, Rfl: 3   aspirin 81 MG chewable tablet, Chew 81 mg by mouth daily., Disp: , Rfl:    Blood Glucose Calibration (OT ULTRA/FASTTK CNTRL SOLN) SOLN, , Disp: , Rfl:    Blood Glucose Monitoring Suppl (ONE TOUCH ULTRA 2) w/Device KIT, SMARTSIG:Via Meter, Disp: , Rfl:    bosutinib (BOSULIF) 100 MG tablet, Take 100 mg by mouth at bedtime. Take with food., Disp: , Rfl:    Cholecalciferol 25 MCG (1000 UT) capsule, Take 1,000 Units by mouth daily., Disp: , Rfl:    famotidine (PEPCID) 40 MG tablet, Take 1 tablet (40 mg total) by mouth at bedtime., Disp: 90 tablet, Rfl: 0   gabapentin (NEURONTIN) 600 MG tablet, Take 1 tablet (600 mg total) by mouth 2 (two) times daily., Disp: 180 tablet, Rfl: 1   glucose blood (ONETOUCH VERIO) test strip, USE TO TEST BLOOD SUGAR EVERY MORNING AND EVERY NIGHT AT BEDTIME, Disp: 100 strip, Rfl: 0   Insulin Glargine ([name] KWIKPEN) 100 UNIT/ML, Inject 22 Units into the skin daily., Disp: , Rfl:    Insulin Pen Needle 31G X 5 MM MISC, Use to inject insulin daily., Disp: 100 each, Rfl: 3   isosorbide mononitrate (IMDUR) 60 MG 24 hr tablet, Take 0.5 tablets (30 mg total) by mouth daily., Disp: 45 tablet, Rfl: 2   JARDIANCE 10 MG TABS tablet, TAKE 1 TABLET(10 MG) BY MOUTH DAILY WITH BREAKFAST, Disp: 90 tablet, Rfl: 0   Lancets (ONETOUCH DELICA PLUS [id number]) MISC, Use to check blood sugar twice daily, Disp: 200 each, Rfl: 3   levothyroxine (SYNTHROID) 25 MCG tablet, TAKE 1  TABLET BY MOUTH EVERY MORNING BEFORE BREAKFAST, Disp: 90 tablet, Rfl: 0   losartan (COZAAR) 25 MG tablet, TAKE 1/2 TABLET(12.5 MG) BY MOUTH DAILY, Disp: 45 tablet, Rfl: 1   Menthol-Zinc Oxide (GOLD BOND EX), Apply 1 application topically daily as needed (muscle pain)., Disp: , Rfl:    metolazone (ZAROXOLYN) 2.5 MG tablet, Take 1 tablet (2.5 mg total) by mouth once a week. Every Wednesday. Take this medication with potassium, Disp: 10 tablet, Rfl: 3   mirabegron ER (MYRBETRIQ) 25 MG TB24 tablet, Take 1 tablet (25 mg total) by mouth daily., Disp: 90 tablet, Rfl: 3   Multiple  Vitamin (MULTIVITAMIN WITH MINERALS) TABS tablet, Take 1 tablet by mouth daily., Disp: , Rfl:    nitroGLYCERIN (NITROSTAT) 0.4 MG SL tablet, For chest pain, tightness, or pressure. While sitting, place 1 tablet under tongue. May be used every 5 minutes as needed, for up to 15 minutes. Do not use more than 3 tablets., Disp: 25 tablet, Rfl: 1   oseltamivir (TAMIFLU) 75 MG capsule, Take 1 capsule (75 mg total) by mouth 2 (two) times daily. For 5 days, Disp: 10 capsule, Rfl: 0   oseltamivir (TAMIFLU) 75 MG capsule, Take 1 capsule (75 mg total) by mouth daily. For 10 days. If develop symptoms change dosing to twice daily to finish course, Disp: 10 capsule, Rfl: 0   Polyethyl Glycol-Propyl Glycol (SYSTANE OP), Place 1 drop into both eyes 3 (three) times daily as needed (dry/irritated eyes.). , Disp: , Rfl:    polyethylene glycol (MIRALAX / GLYCOLAX) 17 g packet, Take 17 g by mouth daily as needed for mild constipation. , Disp: , Rfl:    potassium chloride SA (KLOR-CON M) 20 MEQ tablet, Take 2 tablets (40 mEq total) by mouth once a week. Take with metolazone every Wednesday, Disp: 10 tablet, Rfl: 3   ranolazine (RANEXA) 500 MG 12 hr tablet, Take 1 tablet (500 mg total) by mouth 2 (two) times daily., Disp: 180 tablet, Rfl: 2   rosuvastatin (CRESTOR) 5 MG tablet, Take 1 tablet (5 mg total) by mouth daily., Disp: 90 tablet, Rfl: 3   tamsulosin  (FLOMAX) 0.4 MG CAPS capsule, Take 1 capsule (0.4 mg total) by mouth daily after supper., Disp: 30 capsule, Rfl: 11   torsemide (DEMADEX) 20 MG tablet, Take 3 tablets (60 mg total) by mouth 2 (two) times daily., Disp: 240 tablet, Rfl: 3   traMADol (ULTRAM) 50 MG tablet, Take 1-2 tablets (50-100 mg total) by mouth at bedtime as needed., Disp: 30 tablet, Rfl: 2   warfarin (COUMADIN) 3 MG tablet, TAKE 1 TO 1 & 1/2 TABLETS BY MOUTH AS DIRECTED BY THE COUMADIN CLINIC Strength: 3 mg, Disp: 135 tablet, Rfl: 0     11/05/2021    9:24 AM 10/05/2021    3:12 PM 09/03/2021    3:46 PM  Depression screen PHQ 2/9  Decreased Interest 0 0 0  Down, Depressed, Hopeless 0 0 0  PHQ - 2 Score 0 0 0  Altered sleeping  0   Tired, decreased energy  1   Change in appetite  1   Feeling bad or failure about yourself   0   Trouble concentrating  0   Moving slowly or fidgety/restless  0   Suicidal thoughts  0   PHQ-9 Score  2   Difficult doing work/chores  Not difficult at all        10/05/2021    3:12 PM 04/13/2021   10:57 AM 07/28/2020    1:57 PM 09/08/2019    4:16 PM  GAD 7 : Generalized Anxiety Score  Nervous, Anxious, on Edge 1 0 0 3  Control/stop worrying 0 0 0 3  Worry too much - different things 0 0 0 3  Trouble relaxing 0 0 0 0  Restless 0 0 0 0  Easily annoyed or irritable 0 0 0 0  Afraid - awful might happen 0 0 0 1  Total GAD 7 Score 1 0 0 10  Anxiety Difficulty Not difficult at all Not difficult at all  Not difficult at all    -------------------------------------------------------------------------- O: No physical exam  performed due to remote telephone encounter.  Lab results reviewed.  Recent Results (from the past 2160 hour(s))  COMPLETE METABOLIC PANEL WITH GFR     Status: Abnormal   Collection Time: 12/05/21  3:37 PM  Result Value Ref Range   Glucose, Bld 155 (H) 65 - 99 mg/dL    Comment: .            Fasting reference interval . For someone without known diabetes, a  glucose value >125 mg/dL indicates that they may have diabetes and this should be confirmed with a follow-up test. .    BUN 31 (H) 7 - 25 mg/dL   Creat 1.96 (H) 0.70 - 1.22 mg/dL   eGFR 33 (L) > OR = 60 mL/min/1.57m   BUN/Creatinine Ratio 16 6 - 22 (calc)   Sodium 138 135 - 146 mmol/L   Potassium 4.0 3.5 - 5.3 mmol/L   Chloride 100 98 - 110 mmol/L   CO2 27 20 - 32 mmol/L   Calcium 9.8 8.6 - 10.3 mg/dL   Total Protein 8.0 6.1 - 8.1 g/dL   Albumin 4.3 3.6 - 5.1 g/dL   Globulin 3.7 1.9 - 3.7 g/dL (calc)   AG Ratio 1.2 1.0 - 2.5 (calc)   Total Bilirubin 0.8 0.2 - 1.2 mg/dL   Alkaline phosphatase (APISO) 65 35 - 144 U/L   AST 13 10 - 35 U/L   ALT 8 (L) 9 - 46 U/L  CBC with Differential/Platelet     Status: None   Collection Time: 12/05/21  3:37 PM  Result Value Ref Range   WBC 6.5 3.8 - 10.8 Thousand/uL   RBC 4.62 4.20 - 5.80 Million/uL   Hemoglobin 13.5 13.2 - 17.1 g/dL   HCT 39.8 38.5 - 50.0 %   MCV 86.1 80.0 - 100.0 fL   MCH 29.2 27.0 - 33.0 pg   MCHC 33.9 32.0 - 36.0 g/dL   RDW 14.6 11.0 - 15.0 %   Platelets 191 140 - 400 Thousand/uL   MPV 11.2 7.5 - 12.5 fL   Neutro Abs 3,874 1,500 - 7,800 cells/uL   Lymphs Abs 1,723 850 - 3,900 cells/uL   Absolute Monocytes 709 200 - 950 cells/uL   Eosinophils Absolute 156 15 - 500 cells/uL   Basophils Absolute 39 0 - 200 cells/uL   Neutrophils Relative % 59.6 %   Total Lymphocyte 26.5 %   Monocytes Relative 10.9 %   Eosinophils Relative 2.4 %   Basophils Relative 0.6 %  Lipid panel     Status: Abnormal   Collection Time: 12/05/21  3:37 PM  Result Value Ref Range   Cholesterol 156 <200 mg/dL   HDL 33 (L) > OR = 40 mg/dL   Triglycerides 211 (H) <150 mg/dL    Comment: . If a non-fasting specimen was collected, consider repeat triglyceride testing on a fasting specimen if clinically indicated.  JYates Decampet al. J. of Clin. Lipidol. 22585;2:778-242 .[name]   LDL Cholesterol (Calc) 92 mg/dL (calc)    Comment: Reference range:  <100 . Desirable range <100 mg/dL for primary prevention;   <70 mg/dL for patients with CHD or diabetic patients  with > or = 2 CHD risk factors. .Marland KitchenLDL-C is now calculated using the Martin-Hopkins  calculation, which is a validated novel method providing  better accuracy than the Friedewald equation in the  estimation of LDL-C.  MCresenciano Genreet al. JAnnamaria Helling 23536;144(31: 2061-2068  (http://education.[facility].com/faq/FAQ164)    Total CHOL/HDL Ratio 4.7 <5.0 (calc)  Non-HDL Cholesterol (Calc) 123 <130 mg/dL (calc)    Comment: For patients with diabetes plus 1 major ASCVD risk  factor, treating to a non-HDL-C goal of <100 mg/dL  (LDL-C of <70 mg/dL) is considered a therapeutic  option.   Hemoglobin A1c     Status: Abnormal   Collection Time: 12/05/21  3:37 PM  Result Value Ref Range   Hgb A1c MFr Bld 8.0 (H) <5.7 % of total Hgb    Comment: For someone without known diabetes, a hemoglobin A1c value of 6.5% or greater indicates that they may have  diabetes and this should be confirmed with a follow-up  test. . For someone with known diabetes, a value <7% indicates  that their diabetes is well controlled and a value  greater than or equal to 7% indicates suboptimal  control. A1c targets should be individualized based on  duration of diabetes, age, comorbid conditions, and  other considerations. . Currently, no consensus exists regarding use of hemoglobin A1c for diagnosis of diabetes for children. .    Mean Plasma Glucose 183 mg/dL   eAG (mmol/L) 10.1 mmol/L  PSA     Status: None   Collection Time: 12/05/21  3:37 PM  Result Value Ref Range   PSA 2.16 < OR = 4.00 ng/mL    Comment: The total PSA value from this assay system is  standardized against the WHO standard. The test  result will be approximately 20% lower when compared  to the equimolar-standardized total PSA (Beckman  Coulter). Comparison of serial PSA results should be  interpreted with this fact in  mind. . This test was performed using the Siemens  chemiluminescent method. Values obtained from  different assay methods cannot be used interchangeably. PSA levels, regardless of value, should not be interpreted as absolute evidence of the presence or absence of disease.   TSH     Status: None   Collection Time: 12/05/21  3:37 PM  Result Value Ref Range   TSH 2.69 0.40 - 4.50 mIU/L  Basic metabolic panel     Status: Abnormal   Collection Time: 12/11/21  3:59 PM  Result Value Ref Range   Sodium 140 135 - 145 mmol/L   Potassium 3.8 3.5 - 5.1 mmol/L   Chloride 101 98 - 111 mmol/L   CO2 29 22 - 32 mmol/L   Glucose, Bld 134 (H) 70 - 99 mg/dL    Comment: Glucose reference range applies only to samples taken after fasting for at least 8 hours.   BUN 32 (H) 8 - 23 mg/dL   Creatinine, Ser 1.73 (H) 0.61 - 1.24 mg/dL   Calcium 9.6 8.9 - 10.3 mg/dL   GFR, Estimated 38 (L) >60 mL/min    Comment: (NOTE) Calculated using the CKD-EPI Creatinine Equation (2021)    Anion gap 10 5 - 15    Comment: Performed at [facility], [address] [place], [place] [address]  CBC     Status: None   Collection Time: 12/11/21  3:59 PM  Result Value Ref Range   WBC 9.4 4.0 - 10.5 K/uL   RBC 4.92 4.22 - 5.81 MIL/uL   Hemoglobin 14.1 13.0 - 17.0 g/dL   HCT 43.9 39.0 - 52.0 %   MCV 89.2 80.0 - 100.0 fL   MCH 28.7 26.0 - 34.0 pg   MCHC 32.1 30.0 - 36.0 g/dL   RDW 14.6 11.5 - 15.5 %   Platelets 179 150 - 400 K/uL   nRBC 0.0 0.0 -  0.2 %    Comment: Performed at [facility], [place], [place] [address]  Troponin I (High Sensitivity)     Status: None   Collection Time: 12/11/21  3:59 PM  Result Value Ref Range   Troponin I (High Sensitivity) 14 <18 ng/L    Comment: (NOTE) Elevated high sensitivity troponin I (hsTnI) values and significant  changes across serial measurements may suggest ACS but many other  chronic and acute conditions are known to elevate hsTnI  results.  Refer to the "Links" section for chest pain algorithms and additional  guidance. Performed at [facility], [place], [place] [address]   Troponin I (High Sensitivity)     Status: None   Collection Time: 12/11/21  6:15 PM  Result Value Ref Range   Troponin I (High Sensitivity) 16 <18 ng/L    Comment: (NOTE) Elevated high sensitivity troponin I (hsTnI) values and significant  changes across serial measurements may suggest ACS but many other  chronic and acute conditions are known to elevate hsTnI results.  Refer to the "Links" section for chest pain algorithms and additional  guidance. Performed at [facility], [address] [place], [place] [address]   POCT INR     Status: None   Collection Time: 12/19/21  2:45 PM  Result Value Ref Range   INR 2.3 2.0 - 3.0  ECHOCARDIOGRAM COMPLETE     Status: None   Collection Time: 01/18/22 12:26 PM  Result Value Ref Range   AR max vel 5.64 cm2   AV Peak grad 2.2 mmHg   Ao pk vel 0.74 m/s   S' Lateral 4.15 cm   Area-P 1/2 7.82 cm2   AV Area VTI 5.12 cm2   AV Mean grad 1.0 mmHg   Single Plane A4C EF 35.9 %   Single Plane A2C EF 37.6 %   Calc EF 36.3 %   AV Area mean vel 5.99 cm2  Basic metabolic panel     Status: Abnormal   Collection Time: 01/18/22 12:58 PM  Result Value Ref Range   Sodium 139 135 - 145 mmol/L   Potassium 4.2 3.5 - 5.1 mmol/L   Chloride 102 98 - 111 mmol/L   CO2 26 22 - 32 mmol/L   Glucose, Bld 210 (H) 70 - 99 mg/dL    Comment: Glucose reference range applies only to samples taken after fasting for at least 8 hours.   BUN 41 (H) 8 - 23 mg/dL   Creatinine, Ser 1.99 (H) 0.61 - 1.24 mg/dL   Calcium 9.5 8.9 - 10.3 mg/dL   GFR, Estimated 32 (L) >60 mL/min    Comment: (NOTE) Calculated using the CKD-EPI Creatinine Equation (2021)    Anion gap 11 5 - 15    Comment: Performed at [facility], [address], [place], [place] [address]  Basic Metabolic Panel  (BMET)     Status: Abnormal   Collection Time: 01/23/22 12:27 PM  Result Value Ref Range   Sodium 138 135 - 145 mmol/L   Potassium 4.1 3.5 - 5.1 mmol/L   Chloride 104 98 - 111 mmol/L   CO2 25 22 - 32 mmol/L   Glucose, Bld 166 (H) 70 - 99 mg/dL    Comment: Glucose reference range applies only to samples taken after fasting for at least 8 hours.   BUN 29 (H) 8 - 23 mg/dL   Creatinine, Ser 1.69 (H) 0.61 - 1.24 mg/dL   Calcium  9.1 8.9 - 10.3 mg/dL   GFR, Estimated 39 (L) >60 mL/min    Comment: (NOTE) Calculated using the CKD-EPI Creatinine Equation (2021)    Anion gap 9 5 - 15    Comment: Performed at [facility], [address] [place], [place] [address]  B Nat Peptide     Status: Abnormal   Collection Time: 01/23/22 12:27 PM  Result Value Ref Range   B Natriuretic Peptide 164.6 (H) 0.0 - 100.0 pg/mL    Comment: Performed at [facility], [address] [place], [place] [address]  POCT INR     Status: None   Collection Time: 01/30/22  2:35 PM  Result Value Ref Range   INR 2.5 2.0 - 3.0   POC INR    CBC     Status: None   Collection Time: 02/05/22 12:22 PM  Result Value Ref Range   WBC 8.9 4.0 - 10.5 K/uL   RBC 4.94 4.22 - 5.81 MIL/uL   Hemoglobin 14.3 13.0 - 17.0 g/dL   HCT 42.5 39.0 - 52.0 %   MCV 86.0 80.0 - 100.0 fL   MCH 28.9 26.0 - 34.0 pg   MCHC 33.6 30.0 - 36.0 g/dL   RDW 13.3 11.5 - 15.5 %   Platelets 189 150 - 400 K/uL   nRBC 0.0 0.0 - 0.2 %    Comment: Performed at [facility], [address] [place], [place] [address]  Comprehensive Metabolic Panel (CMET)     Status: Abnormal   Collection Time: 02/05/22 12:22 PM  Result Value Ref Range   Sodium 132 (L) 135 - 145 mmol/L   Potassium 3.2 (L) 3.5 - 5.1 mmol/L   Chloride 88 (L) 98 - 111 mmol/L   CO2 31 22 - 32 mmol/L   Glucose, Bld 263 (H) 70 - 99 mg/dL    Comment: Glucose reference range applies only to samples taken after fasting for at least 8 hours.   BUN 70 (H) 8 - 23 mg/dL    Creatinine, Ser 2.21 (H) 0.61 - 1.24 mg/dL   Calcium 9.5 8.9 - 10.3 mg/dL   Total Protein 8.3 (H) 6.5 - 8.1 g/dL   Albumin 4.1 3.5 - 5.0 g/dL   AST 25 15 - 41 U/L   ALT 12 0 - 44 U/L   Alkaline Phosphatase 78 38 - 126 U/L   Total Bilirubin 0.8 0.3 - 1.2 mg/dL   GFR, Estimated 28 (L) >60 mL/min    Comment: (NOTE) Calculated using the CKD-EPI Creatinine Equation (2021)    Anion gap 13 5 - 15    Comment: Performed at [facility], [address] [place], [place] [address]  B Nat Peptide     Status: Abnormal   Collection Time: 02/05/22 12:22 PM  Result Value Ref Range   B Natriuretic Peptide 113.8 (H) 0.0 - 100.0 pg/mL    Comment: Performed at [facility], [address] [place], [place] [address]  TSH     Status: None   Collection Time: 02/05/22 12:22 PM  Result Value Ref Range   TSH 3.204 0.350 - 4.500 uIU/mL    Comment: Performed by a 3rd Generation assay with a functional sensitivity of <=0.01 uIU/mL. Performed at [facility], [address] [place], [place] [address]   Basic metabolic panel     Status: Abnormal   Collection Time: 02/20/22  4:38 PM  Result Value Ref Range   Sodium 138 135 - 145 mmol/L  Potassium 4.3 3.5 - 5.1 mmol/L   Chloride 104 98 - 111 mmol/L   CO2 27 22 - 32 mmol/L   Glucose, Bld 148 (H) 70 - 99 mg/dL    Comment: Glucose reference range applies only to samples taken after fasting for at least 8 hours.   BUN 29 (H) 8 - 23 mg/dL   Creatinine, Ser 1.64 (H) 0.61 - 1.24 mg/dL   Calcium 9.4 8.9 - 10.3 mg/dL   GFR, Estimated 40 (L) >60 mL/min    Comment: (NOTE) Calculated using the CKD-EPI Creatinine Equation (2021)    Anion gap 7 5 - 15    Comment: Performed at [facility], [address] [place], [place] [address]  Urinalysis, Routine w reflex microscopic     Status: Abnormal   Collection Time: 02/24/22  6:33 PM  Result Value Ref Range   Color, Urine YELLOW (A) YELLOW   APPearance CLEAR (A) CLEAR    Specific Gravity, Urine 1.016 1.005 - 1.030   pH 6.0 5.0 - 8.0   Glucose, UA >=500 (A) NEGATIVE mg/dL   Hgb urine dipstick NEGATIVE NEGATIVE   Bilirubin Urine NEGATIVE NEGATIVE   Ketones, ur NEGATIVE NEGATIVE mg/dL   Protein, ur NEGATIVE NEGATIVE mg/dL   Nitrite NEGATIVE NEGATIVE   Leukocytes,Ua NEGATIVE NEGATIVE   RBC / HPF 0-5 0 - 5 RBC/hpf   WBC, UA 0-5 0 - 5 WBC/hpf   Bacteria, UA NONE SEEN NONE SEEN   Squamous Epithelial / LPF 0-5 0 - 5    Comment: Performed at [facility], [address] [place], [place] [address]  Basic metabolic panel     Status: Abnormal   Collection Time: 02/24/22  6:33 PM  Result Value Ref Range   Sodium 137 135 - 145 mmol/L   Potassium 4.3 3.5 - 5.1 mmol/L   Chloride 104 98 - 111 mmol/L   CO2 23 22 - 32 mmol/L   Glucose, Bld 159 (H) 70 - 99 mg/dL    Comment: Glucose reference range applies only to samples taken after fasting for at least 8 hours.   BUN 42 (H) 8 - 23 mg/dL   Creatinine, Ser 2.20 (H) 0.61 - 1.24 mg/dL   Calcium 9.5 8.9 - 10.3 mg/dL   GFR, Estimated 28 (L) >60 mL/min    Comment: (NOTE) Calculated using the CKD-EPI Creatinine Equation (2021)    Anion gap 10 5 - 15    Comment: Performed at [facility], [address] [place], [place] [address]  CBC     Status: None   Collection Time: 02/24/22  6:33 PM  Result Value Ref Range   WBC 9.0 4.0 - 10.5 K/uL   RBC 4.69 4.22 - 5.81 MIL/uL   Hemoglobin 13.4 13.0 - 17.0 g/dL   HCT 41.6 39.0 - 52.0 %   MCV 88.7 80.0 - 100.0 fL   MCH 28.6 26.0 - 34.0 pg   MCHC 32.2 30.0 - 36.0 g/dL   RDW 14.3 11.5 - 15.5 %   Platelets 197 150 - 400 K/uL   nRBC 0.0 0.0 - 0.2 %    Comment: Performed at [facility], [address], [place], [place] [address]  Protime-INR - (order if patient is taking Coumadin / Warfarin)     Status: Abnormal   Collection Time: 02/24/22  6:33 PM  Result Value Ref Range   Prothrombin Time 25.1 (H) 11.4 - 15.2 seconds   INR 2.3 (H) 0.8 - 1.2  Comment: (NOTE) INR goal varies based on device and disease states. Performed at [facility], [place], [place] [address]   Troponin I (High Sensitivity)     Status: Abnormal   Collection Time: 02/24/22  6:33 PM  Result Value Ref Range   Troponin I (High Sensitivity) 28 (H) <18 ng/L    Comment: (NOTE) Elevated high sensitivity troponin I (hsTnI) values and significant  changes across serial measurements may suggest ACS but many other  chronic and acute conditions are known to elevate hsTnI results.  Refer to the "Links" section for chest pain algorithms and additional  guidance. Performed at [facility], [place], [place] [address]   Troponin I (High Sensitivity)     Status: Abnormal   Collection Time: 02/24/22 10:47 PM  Result Value Ref Range   Troponin I (High Sensitivity) 27 (H) <18 ng/L    Comment: (NOTE) Elevated high sensitivity troponin I (hsTnI) values and significant  changes across serial measurements may suggest ACS but many other  chronic and acute conditions are known to elevate hsTnI results.  Refer to the "Links" section for chest pain algorithms and additional  guidance. Performed at [facility], [address] [place], [place] [address]   Brain natriuretic peptide     Status: Abnormal   Collection Time: 02/24/22 11:44 PM  Result Value Ref Range   B Natriuretic Peptide 222.1 (H) 0.0 - 100.0 pg/mL    Comment: Performed at [facility], [address] [place], [place] [address]  Hepatic function panel     Status: Abnormal   Collection Time: 02/24/22 11:44 PM  Result Value Ref Range   Total Protein 8.4 (H) 6.5 - 8.1 g/dL   Albumin 4.2 3.5 - 5.0 g/dL   AST 20 15 - 41 U/L   ALT 12 0 - 44 U/L   Alkaline Phosphatase 61 38 - 126 U/L   Total Bilirubin 0.8 0.3 - 1.2 mg/dL   Bilirubin, Direct <0.1 0.0 - 0.2 mg/dL   Indirect Bilirubin NOT CALCULATED 0.3 - 0.9 mg/dL    Comment: Performed at  [facility], [address] [place], [place] [address]  Resp Panel by RT-PCR (Flu A&B, Covid) Anterior Nasal Swab     Status: Abnormal   Collection Time: 02/25/22 12:29 AM   Specimen: Anterior Nasal Swab  Result Value Ref Range   SARS Coronavirus 2 by RT PCR NEGATIVE NEGATIVE    Comment: (NOTE) SARS-CoV-2 target nucleic acids are NOT DETECTED.  The SARS-CoV-2 RNA is generally detectable in upper respiratory specimens during the acute phase of infection. The lowest concentration of SARS-CoV-2 viral copies this assay can detect is 138 copies/mL. A negative result does not preclude SARS-Cov-2 infection and should not be used as the sole basis for treatment or other patient management decisions. A negative result may occur with  improper specimen collection/handling, submission of specimen other than nasopharyngeal swab, presence of viral mutation(s) within the areas targeted by this assay, and inadequate number of viral copies(<138 copies/mL). A negative result must be combined with clinical observations, patient history, and epidemiological information. The expected result is Negative.  Fact Sheet for Patients:  EntrepreneurPulse.com.au  Fact Sheet for Healthcare Providers:  IncredibleEmployment.be  This test is no t yet approved or cleared by the [place] FDA and  has been authorized for detection and/or diagnosis of SARS-CoV-2 by FDA under an Emergency Use Authorization (EUA). This EUA will remain  in effect (meaning this  test can be used) for the duration of the COVID-19 declaration under Section 564(b)(1) of the Act, 21 U.S.C.section 360bbb-3(b)(1), unless the authorization is terminated  or revoked sooner.       Influenza A by PCR POSITIVE (A) NEGATIVE   Influenza B by PCR NEGATIVE NEGATIVE    Comment: (NOTE) The Xpert Xpress SARS-CoV-2/FLU/RSV plus assay is intended as an aid in the diagnosis of influenza from  Nasopharyngeal swab specimens and should not be used as a sole basis for treatment. Nasal washings and aspirates are unacceptable for Xpert Xpress SARS-CoV-2/FLU/RSV testing.  Fact Sheet for Patients: EntrepreneurPulse.com.au  Fact Sheet for Healthcare Providers: IncredibleEmployment.be  This test is not yet approved or cleared by the [place] FDA and has been authorized for detection and/or diagnosis of SARS-CoV-2 by FDA under an Emergency Use Authorization (EUA). This EUA will remain in effect (meaning this test can be used) for the duration of the COVID-19 declaration under Section 564(b)(1) of the Act, 21 U.S.C. section 360bbb-3(b)(1), unless the authorization is terminated or revoked.  Performed at [facility], [place], [place] [address]   Troponin I (High Sensitivity)     Status: Abnormal   Collection Time: 02/25/22 12:47 AM  Result Value Ref Range   Troponin I (High Sensitivity) 27 (H) <18 ng/L    Comment: (NOTE) Elevated high sensitivity troponin I (hsTnI) values and significant  changes across serial measurements may suggest ACS but many other  chronic and acute conditions are known to elevate hsTnI results.  Refer to the "Links" section for chest pain algorithms and additional  guidance. Performed at [facility], [address], [place], [place] [address]     -------------------------------------------------------------------------- A&P:  Problem List Items Addressed This Visit   None Visit Diagnoses     Influenza A    -  Primary   Relevant Medications   oseltamivir (TAMIFLU) 75 MG capsule   oseltamivir (TAMIFLU) 75 MG capsule      Clinically consistent with flu and confirmed Influenza A based on reported positive test  - Duration x 3 days, without complication. Tolerating PO and well hydrated - S/p influenza vaccine this season  11/2021  Plan: 1. Start Tamiflu 35m capsules  BID x 5 days, also provided additional rx for patient's spouse due to close contact 2. Supportive care as advised with NSAID / Tylenol PRN fever/myalgias, improve hydration, may take OTC Cold/Flu meds 3. Return criteria given if significant worsening, consider post-influenza complications, otherwise follow-up if needed   Meds ordered this encounter  Medications   oseltamivir (TAMIFLU) 75 MG capsule    Sig: Take 1 capsule (75 mg total) by mouth 2 (two) times daily. For 5 days    Dispense:  10 capsule    Refill:  0    For patient   oseltamivir (TAMIFLU) 75 MG capsule    Sig: Take 1 capsule (75 mg total) by mouth daily. For 10 days. If develop symptoms change dosing to twice daily to finish course    Dispense:  10 capsule    Refill:  0    For wife, family member as preventative course.    Follow-up: - Return in 1 week if not improved  Patient verbalizes understanding with the above medical recommendations including the limitation of remote medical advice.  Specific follow-up and call-back criteria were given for patient to follow-up or seek medical care more urgently if needed.   - Time spent in direct consultation with patient on phone: 8  minutes   [name], DO [facility] 02/25/2022, 3:03 PM

## 2022-02-25 NOTE — Patient Instructions (Signed)
Influenza, Adult Influenza is also called "the flu." It is an infection in the lungs, nose, and throat (respiratory tract). It spreads easily from person to person (is contagious). The flu causes symptoms that are like a cold, along with high fever and body aches. What are the causes? This condition is caused by the influenza virus. You can get the virus by: Breathing in droplets that are in the air after a person infected with the flu coughed or sneezed. Touching something that has the virus on it and then touching your mouth, nose, or eyes. What increases the risk? Certain things may make you more likely to get the flu. These include: Not washing your hands often. Having close contact with many people during cold and flu season. Touching your mouth, eyes, or nose without first washing your hands. Not getting a flu shot every year. You may have a higher risk for the flu, and serious problems, such as a lung infection (pneumonia), if you: Are older than 65. Are pregnant. Have a weakened disease-fighting system (immune system) because of a disease or because you are taking certain medicines. Have a long-term (chronic) condition, such as: Heart, kidney, or lung disease. Diabetes. Asthma. Have a liver disorder. Are very overweight (morbidly obese). Have anemia. What are the signs or symptoms? Symptoms usually begin suddenly and last 4-14 days. They may include: Fever and chills. Headaches, body aches, or muscle aches. Sore throat. Cough. Runny or stuffy (congested) nose. Feeling discomfort in your chest. Not wanting to eat as much as normal. Feeling weak or tired. Feeling dizzy. Feeling sick to your stomach or throwing up. How is this treated? If the flu is found early, you can be treated with antiviral medicine. This can help to reduce how bad the illness is and how long it lasts. This may be given by mouth or through an IV tube. Taking care of yourself at home can help your  symptoms get better. Your doctor may want you to: Take over-the-counter medicines. Drink plenty of fluids. The flu often goes away on its own. If you have very bad symptoms or other problems, you may be treated in a hospital. Follow these instructions at home:     Activity Rest as needed. Get plenty of sleep. Stay home from work or school as told by your doctor. Do not leave home until you do not have a fever for 24 hours without taking medicine. Leave home only to go to your doctor. Eating and drinking Take an ORS (oral rehydration solution). This is a drink that is sold at pharmacies and stores. Drink enough fluid to keep your pee pale yellow. Drink clear fluids in small amounts as you are able. Clear fluids include: Water. Ice chips. Fruit juice mixed with water. Low-calorie sports drinks. Eat bland foods that are easy to digest. Eat small amounts as you are able. These foods include: Bananas. Applesauce. Rice. Lean meats. Toast. Crackers. Do not eat or drink: Fluids that have a lot of sugar or caffeine. Alcohol. Spicy or fatty foods. General instructions Take over-the-counter and prescription medicines only as told by your doctor. Use a cool mist humidifier to add moisture to the air in your home. This can make it easier for you to breathe. When using a cool mist humidifier, clean it daily. Empty water and replace with clean water. Cover your mouth and nose when you cough or sneeze. Wash your hands with soap and water often and for at least 20 seconds. This is also   important after you cough or sneeze. If you cannot use soap and water, use alcohol-based hand sanitizer. Keep all follow-up visits. How is this prevented?  Get a flu shot every year. You may get the flu shot in late summer, fall, or winter. Ask your doctor when you should get your flu shot. Avoid contact with people who are sick during fall and winter. This is cold and flu season. Contact a doctor if: You  get new symptoms. You have: Chest pain. Watery poop (diarrhea). A fever. Your cough gets worse. You start to have more mucus. You feel sick to your stomach. You throw up. Get help right away if you: Have shortness of breath. Have trouble breathing. Have skin or nails that turn a bluish color. Have very bad pain or stiffness in your neck. Get a sudden headache. Get sudden pain in your face or ear. Cannot eat or drink without throwing up. These symptoms may represent a serious problem that is an emergency. Get medical help right away. Call your local emergency services (911 in the [place] Do not wait to see if the symptoms will go away. Do not drive yourself to the hospital. Summary Influenza is also called "the flu." It is an infection in the lungs, nose, and throat. It spreads easily from person to person. Take over-the-counter and prescription medicines only as told by your doctor. Getting a flu shot every year is the best way to not get the flu. This information is not intended to replace advice given to you by your health care provider. Make sure you discuss any questions you have with your health care provider. Document Revised: 10/22/2019 Document Reviewed: 10/22/2019 Elsevier Patient Education  [place].   Please schedule a Follow-up Appointment to: No follow-ups on file.  If you have any other questions or concerns, please feel free to call the office or send a message through [place]. You may also schedule an earlier appointment if necessary.  Additionally, you may be receiving a survey about your experience at our office within a few days to 1 week by e-mail or mail. We value your feedback.  [name], DO [place]

## 2022-02-25 NOTE — Telephone Encounter (Signed)
  Chief Complaint: Covid positive    Has an appt today at 2:40.   Went to ED last night and was tested for Covid.   THey called this morning and told him he was positive. Symptoms: Not triaged since seen in the ED.    Wife wanted to know about isolation and mask wearing. Frequency: N/A Pertinent Negatives: Patient denies N/A Disposition: '[]'$ ED /'[]'$ Urgent Care (no appt availability in office) / '[x]'$ Appointment(In office/virtual)/ '[]'$  [facility]/ '[]'$ Home Care/ '[]'$ Refused Recommended Disposition /'[]'$  Mobile Bus/ '[]'$  Follow-up with PCP Additional Notes: Has appt for today at 2:40 regarding antivirals.

## 2022-03-05 ENCOUNTER — Ambulatory Visit: Payer: Self-pay

## 2022-03-05 NOTE — Telephone Encounter (Signed)
Summary: Pt wife requesting another 5 day supply of Tamiflu   Pt wife requests that another 5 day supply of the Rx for oseltamivir (TAMIFLU) 75 MG capsule be sent to [facility] [place], [place] [facility] Phone: [phone]  Fax: [phone]      Called pt - [facility]

## 2022-03-05 NOTE — Telephone Encounter (Signed)
Summary: Pt wife requesting another 5 day supply of Tamiflu   Pt wife requests that another 5 day supply of the Rx for oseltamivir (TAMIFLU) 75 MG capsule be sent to [facility] [place], [place] [facility] Phone: [phone]  Fax: [phone]    Called pt - [facility]

## 2022-03-05 NOTE — Telephone Encounter (Addendum)
Message from [place] sent at 03/05/2022 10:34 AM EST  Summary: Pt wife requesting another 5 day supply of Tamiflu   Pt wife requests that another 5 day supply of the Rx for oseltamivir (TAMIFLU) 75 MG capsule be sent to [place], [facility] Phone: [phone]  Fax: [phone]        Third attempt to reach pt. LM on VM to call back to discuss sx. Advised that per noted of PCP if not improved will need an office visit. Routing to office.

## 2022-03-07 ENCOUNTER — Other Ambulatory Visit: Payer: Self-pay

## 2022-03-07 MED ORDER — LOSARTAN POTASSIUM 25 MG PO TABS: ORAL_TABLET | ORAL | 10 refills | Status: DC

## 2022-03-13 ENCOUNTER — Ambulatory Visit: Payer: Medicare HMO

## 2022-03-15 ENCOUNTER — Ambulatory Visit: Payer: Medicare HMO | Admitting: Pharmacist

## 2022-03-15 DIAGNOSIS — Z794 Long term (current) use of insulin: Secondary | ICD-10-CM

## 2022-03-15 NOTE — Patient Instructions (Signed)
Visit Information  Thank you for taking time to visit with me today. Please don't hesitate to contact me if I can be of assistance to you before our next scheduled telephone appointment.  Following are the goals we discussed today:   Goals Addressed             This Visit's Progress    Pharmacy Goals       Please check your blood sugar, keep a log of the results and bring this with you to your medical appointments.  Please check your home blood pressure, keep a log of the results and bring this with you to your medical appointments.  Our goal bad cholesterol, or LDL, is less than 70 . This is why it is important to continue taking your rosuvastatin.  Feel free to call me with any questions or concerns. I look forward to our next call!   [name], PharmD, [place], CPP Clinical Pharmacist [facility] [phone]          Our next appointment is by telephone on 04/15/2022 at 10:00 AM  Please call the care guide team at [phone] if you need to cancel or reschedule your appointment.    Caregiver verbalized understanding of instructions, educational materials, and care plan provided today and DECLINED offer to receive copy of patient instructions, educational materials, and care plan.

## 2022-03-15 NOTE — Chronic Care Management (AMB) (Signed)
Chronic Care Management CCM Pharmacy Note  03/15/2022 Name:  Lucas Caldwell MRN:  [id number] DOB:  1934/01/07   Subjective: Lucas Caldwell is an 88 y.o. year old male who is a primary patient of Lucas Hauser, DO.  The CCM team was consulted for assistance with disease management and care coordination needs.    Engaged with patient's wife by telephone for follow up visit for pharmacy case management and/or care coordination services.   Objective:  Medications Reviewed Today     Reviewed by Lucas Hauser, DO (Physician) on 02/25/22 at 1500  Med List Status: <None>   Medication Order Taking? Sig Documenting Provider Last Dose Status Informant  acetaminophen (TYLENOL) 500 MG tablet [id number] Yes Take 1,000 mg by mouth every 6 (six) hours as needed for moderate pain or headache.  [provider] Taking Active Spouse/Significant Other  Alcohol Swabs (B-D SINGLE USE SWABS REGULAR) PADS [id number] Yes Use to check blood sugar up to 2 times daily Lucas Hauser, DO Taking Active Spouse/Significant Other  allopurinol (ZYLOPRIM) 100 MG tablet [id number] Yes Take 1 tablet (100 mg total) by mouth daily. Lucas Gianotti, NP Taking Active   aspirin 81 MG chewable tablet [id number] Yes Chew 81 mg by mouth daily. [provider] Taking Active Spouse/Significant Other  Blood Glucose Calibration (OT ULTRA/FASTTK CNTRL SOLN) Lucas Caldwell [id number] Yes  [provider] Taking Active   Blood Glucose Monitoring Suppl (ONE TOUCH ULTRA 2) w/Device KIT [id number] Yes SMARTSIG:Via Meter [provider] Taking Active   bosutinib (BOSULIF) 100 MG tablet [id number] Yes Take 100 mg by mouth at bedtime. Take with food. [provider] Taking Active Spouse/Significant Other           Med Note Lucas Caldwell Sep 15, 2019  8:38 AM)    Cholecalciferol 25 MCG (1000 UT) capsule [id number] Yes Take 1,000 Units by mouth daily. [provider] Taking Active Spouse/Significant Other  famotidine (PEPCID) 40 MG tablet [id number] Yes Take 1 tablet (40 mg total) by mouth at bedtime. Lucas Hauser, DO Taking Active   gabapentin (NEURONTIN) 600 MG tablet [id number] Yes Take 1 tablet (600 mg total) by mouth 2 (two) times daily. Lucas Hauser, DO Taking Active   glucose blood [facility] VERIO) test strip [id number] Yes USE TO TEST BLOOD SUGAR EVERY MORNING AND EVERY NIGHT AT BEDTIME Lucas Hauser, DO Taking Active   Insulin Glargine ([name] KWIKPEN) 100 UNIT/ML [id number] Yes Inject 22 Units into the skin daily. [provider] Taking Active   Insulin Pen Needle 31G X 5 MM MISC [id number] Yes Use to inject insulin daily. Lucas Hauser, DO Taking Active   isosorbide mononitrate (IMDUR) 60 MG 24 hr tablet [id number] Yes Take 0.5 tablets (30 mg total) by mouth daily. Lucas Bush, MD Taking Active   JARDIANCE 10 MG TABS tablet [id number] Yes TAKE 1 TABLET(10 MG) BY MOUTH DAILY WITH BREAKFAST [place], [name], DO Taking Active   Lancets (ONETOUCH DELICA PLUS [id number]) Lucas Caldwell [id number] Yes Use to check blood sugar twice daily Lucas Hauser, DO Taking Active   levothyroxine (SYNTHROID) 25 MCG tablet [id number] Yes TAKE 1 TABLET BY MOUTH EVERY MORNING BEFORE BREAKFAST Lucas, [name], DO Taking Active   losartan (COZAAR) 25 MG tablet [id number] Yes TAKE 1/2 TABLET(12.5 MG) BY MOUTH DAILY End, Lucas Gave, MD Taking Active   Menthol-Zinc Oxide (GOLD [place]) [id number] Yes Apply 1 application topically daily as needed (muscle pain). [provider]  Taking Active Spouse/Significant Other  metolazone (ZAROXOLYN) 2.5 MG tablet [id number] Yes Take 1 tablet (2.5 mg total) by mouth once a week. Every Wednesday. Take this medication with potassium Caldwell, Lucas Pascal, MD Taking Active   mirabegron ER (MYRBETRIQ) 25 MG TB24 tablet [id number] Yes Take 1 tablet (25 mg  total) by mouth daily. Lucas Loop, PA-C Taking Active   Multiple Vitamin (MULTIVITAMIN WITH MINERALS) TABS tablet [id number] Yes Take 1 tablet by mouth daily. [provider] Taking Active Spouse/Significant Other  nitroGLYCERIN (NITROSTAT) 0.4 MG SL tablet [id number] Yes For chest pain, tightness, or pressure. While sitting, place 1 tablet under tongue. May be used every 5 minutes as needed, for up to 15 minutes. Do not use more than 3 tablets. Lucas Bush, MD Taking Active   Polyethyl Glycol-Propyl Glycol [facility]) [id number] Yes Place 1 drop into both eyes 3 (three) times daily as needed (dry/irritated eyes.).  [provider] Taking Active Spouse/Significant Other  polyethylene glycol (MIRALAX / GLYCOLAX) 17 g packet [id number] Yes Take 17 g by mouth daily as needed for mild constipation.  [provider] Taking Active Spouse/Significant Other  potassium chloride SA (KLOR-CON M) 20 MEQ tablet [id number] Yes Take 2 tablets (40 mEq total) by mouth once a week. Take with metolazone every Wednesday Caldwell, Lucas Pascal, MD Taking Active   ranolazine (RANEXA) 500 MG 12 hr tablet [id number] Yes Take 1 tablet (500 mg total) by mouth 2 (two) times daily. End, Lucas Gave, MD Taking Active   rosuvastatin (CRESTOR) 5 MG tablet [id number] Yes Take 1 tablet (5 mg total) by mouth daily. End, Lucas Gave, MD Taking Active   tamsulosin [facility]) 0.4 MG CAPS capsule [id number] Yes Take 1 capsule (0.4 mg total) by mouth daily after supper. Lucas Mu, PA-C Taking Active Spouse/Significant Other  torsemide (DEMADEX) 20 MG tablet [id number] Yes Take 3 tablets (60 mg total) by mouth 2 (two) times daily. Caldwell, Lucas Pascal, MD Taking Active   traMADol (ULTRAM) 50 MG tablet [id number] Yes Take 1-2 tablets (50-100 mg total) by mouth at bedtime as needed. Lucas Hauser, DO Taking Active Spouse/Significant Other  warfarin (COUMADIN) 3 MG tablet [id number] Yes TAKE 1 TO 1 & 1/2  TABLETS BY MOUTH AS DIRECTED BY THE COUMADIN CLINIC Strength: 3 mg End, Lucas Gave, MD Taking Active             Pertinent Labs:   Lab Results  Component Value Date   HGBA1C 8.0 (H) 12/05/2021   Lab Results  Component Value Date   CREATININE 2.20 (H) 02/24/2022   BUN 42 (H) 02/24/2022   NA 137 02/24/2022   K 4.3 02/24/2022   CL 104 02/24/2022   CO2 23 02/24/2022    SDOH:  (Social Determinants of Health) assessments and interventions performed:  SDOH Interventions    Flowsheet Row Chronic Care Management from 01/28/2022 in [facility] Coordination from 11/05/2021 in [place] Coordination Office Visit from 10/05/2021 in [place] from 09/03/2021 in [place] Management from 10/02/2020 in [facility] Office Visit from 09/08/2019 in [place]  SDOH Interventions        Food Insecurity Interventions -- Intervention Not Indicated -- Intervention Not Indicated -- --  Housing Interventions -- Intervention Not Indicated -- Intervention Not Indicated -- --  Transportation Interventions -- Intervention Not Indicated -- Intervention Not Indicated -- --  Utilities Interventions Intervention Not Indicated -- -- -- -- --  Depression Interventions/Treatment  -- -- Counseling -- -- Counseling  Financial Strain Interventions -- Intervention Not Indicated -- Intervention Not Indicated -- --  Physical Activity Interventions -- Other (Comments)  [gets really short of breath due to chronic conditions] -- -- Other (Comments)  [gets easily short of breath. Limited to the activity he can do] --  Stress Interventions -- -- -- Intervention Not Indicated -- --  Social Connections Interventions -- -- -- Intervention Not Indicated -- --       CCM Care Plan  Review of patient past medical history, allergies, medications, health status, including review of  consultants reports, laboratory and other test data, was performed as part of comprehensive evaluation and provision of chronic care management services.   Care Plan : PharmD - Medication Management/Assistance  Updates made by Rennis Petty, RPH-CPP since 03/15/2022 12:00 AM     Problem: Disease Progression      Long-Range Goal: Disease Progression Prevented or Minimized   Start Date: 04/14/2020  Expected End Date: 07/13/2020  Recent Progress: On track  Priority: High  Note:   Current Barriers:  Chronic Disease Management support, education, and care coordination needs related to type 2 diabetes, CKD, HFrEF s/p CardioMEMS, hyperlipidemia, atrial fibrillation, hypertension, coronary artery disease, CHF, hypothyroidism, and CML Financial Note patient has Partial Extra Help/LIS Subsidy Patient enrolled in [place] patient assistance program for 2023 calendar year Patient denied for patient assistance for [place] from [facility] for 2022 calendar year Patient previously enrolled in [place] for [employer] copayment assistance, but eligibility ended as not in use ($0 copayment through Part D plan coverage for Jardiance) Limited vision  Pharmacist Clinical Goal(s):  Over the next 90 days, patient will verbalize ability to afford treatment regimen through collaboration with PharmD and provider.   Interventions: 1:1 collaboration with Lucas Hauser, DO regarding development and update of comprehensive plan of care as evidenced by provider attestation and co-signature Inter-disciplinary care team collaboration (see longitudinal plan of care) Today patient's wife reports patient is doing much better since having the flu. Reports his symptoms improved ~1 week ago   Type 2 Diabetes: Current treatment: [name] 22 units daily Jardiance 10 mg once daily in morning Previous therapies tried: Trulicity (stopped due to concern could be contributing to  diarrhea) Reports recent fasting blood sugars readings:      Morning Fasting Blood Sugar  22 - December 140  23 - December 131  24 - December 146  25 - December 168  26 - December 146  27 - December 140  28 - December 131  29 - December -  Attributes improvement in morning readings to patient eating a more balanced supper and no longer having popsicles at bedtime Denies hypoglycemia Have counseled on signs of hypoglycemia and how to manage low blood sugar Exercise: walks with walker/cane ~10 minutes most days of the week Have discussed importance of fall prevention/consistently using walker Counsel to continue to monitor home blood sugar, keep a log of results, bring this record to medical appointments and call office sooner if needed for readings outside of established parameters or symptoms Statin: rosuvastatin 5 mg daily Remind patient's wife to follow up with [facility] [phone]) when patient in need of further diabetes testing supplies Counsel to continue to monitor home blood sugar, keep log of results, have this record to review during upcoming appointments and to call office sooner if needed for readings outside of established parameters or for new symptoms  Medication Assistance: Collaborating with [place] for assistance to patient with [employer] patient assistance program application for 6160 calendar year Per note from [place], patient's application faxed to [place] on 01/23/2022 Reports patient has ~2 weeks supply of [name] remaining [name] states she is going to call [employer] today to determine status of latest refill shipment Outreach to [employer] on behalf of patient. Speak with representative Tillie Rung who advises refill cannot be processed within current calendar year, but patient is APPROVED for re-enrollment in assistance program from 03/18/2022-03/18/2023 and so refill can be processed for him next week (once new  Rx received from [place]) [place] of patient's limited supply of [name] remaining and she states that she will put a note to expedite the processing of his [name] next week. Offer to send Rx for [name] into local pharmacy for patient - [name] states she will instead call me or the office back for this if unable to receive from assistance program. States that she will follow up with Donita Brooks next week  Patient Goals/Self-Care Activities Over the next 90 days, patient will:  Check blood sugar, blood pressure and fluid status as directed by providers  Take medications, with assistance of wife, as directed Mrs. Linn manages patient's medications using weekly pillbox as adherence tool Attends scheduled medical appointments        Plan: Telephone follow up appointment with care management team member scheduled for:  04/15/2022 at 10:00 AM  [name], PharmD, Para [place], [place] [phone]

## 2022-03-17 DIAGNOSIS — E1121 Type 2 diabetes mellitus with diabetic nephropathy: Secondary | ICD-10-CM

## 2022-03-17 DIAGNOSIS — Z794 Long term (current) use of insulin: Secondary | ICD-10-CM

## 2022-03-17 DIAGNOSIS — I5022 Chronic systolic (congestive) heart failure: Secondary | ICD-10-CM

## 2022-03-20 ENCOUNTER — Ambulatory Visit: Payer: Medicare HMO | Attending: Internal Medicine

## 2022-03-20 DIAGNOSIS — Z7901 Long term (current) use of anticoagulants: Secondary | ICD-10-CM | POA: Diagnosis not present

## 2022-03-20 DIAGNOSIS — Z5181 Encounter for therapeutic drug level monitoring: Secondary | ICD-10-CM | POA: Diagnosis not present

## 2022-03-20 DIAGNOSIS — I480 Paroxysmal atrial fibrillation: Secondary | ICD-10-CM

## 2022-03-20 LAB — POCT INR: INR: 3 (ref 2.0–3.0)

## 2022-03-20 NOTE — Patient Instructions (Signed)
Continue dosage of warfarin of 1.5 TABLETS EVERY DAY EXCEPT 1 TABLET ON MONDAYS & FRIDAYS.  Recheck INR in 6 weeks

## 2022-03-21 ENCOUNTER — Telehealth: Payer: Self-pay

## 2022-03-21 DIAGNOSIS — E119 Type 2 diabetes mellitus without complications: Secondary | ICD-10-CM | POA: Diagnosis not present

## 2022-03-21 DIAGNOSIS — Z596 Low income: Secondary | ICD-10-CM

## 2022-03-21 DIAGNOSIS — H35372 Puckering of macula, left eye: Secondary | ICD-10-CM | POA: Diagnosis not present

## 2022-03-21 DIAGNOSIS — H353221 Exudative age-related macular degeneration, left eye, with active choroidal neovascularization: Secondary | ICD-10-CM | POA: Diagnosis not present

## 2022-03-21 LAB — HM DIABETES EYE EXAM

## 2022-03-21 NOTE — Progress Notes (Addendum)
[place] [facility])                                            [place] Team    03/21/2022  CONNY MOENING 1933/10/22 [id number]  Care Coordination call placed to [place] in regards to basaglar.  Spoke with Chip who informs patient has been approved 1/12024-03/18/2023.  A 120 day supply of medications will be shipped to the patients home.  Patient is aware of the approval.  Lelon Huh, [place] [phone]

## 2022-03-24 NOTE — Progress Notes (Signed)
Advanced Heart Failure Clinic Note   Date:  02/05/2022   ID:  Lucas Caldwell, DOB [birth year], MRN [id number]  Location: Home  Provider location: [place] [facility] Type of Visit: Established patient   PCP:  [name], DO  Cardiologist:  [name], MD HF Cardiologist: [name]  Chief Complaint:  No chief complaint on file.  Heart failure follow up   HPI: Lucas Caldwell is a 89 y.o. 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 Saunders Revel for further evaluation of his HF.     Has h/o NICM previously followed by Dr. Carolynn Serve at [facility]. He had good response to medical therapy in 2012 EF was reported to be 50%    Had PCI of his RCA CAD s/p RCA stent x2 08/2016 at [facility].    Echo 6/20 LVEF 35-40%   Cardiac cath 08/2018 70% LM stenosis with abnl FFR of 0.86, though drop in iFR was due to diffuse prox LAD disease and only small gradient involving ostial LM. Imdur was increased.    Admitted 04/13/19 with chest pain and A/C systolic heart failure. Repeat Cath 1/21 LM 60% LAD 70% with +FFR otherwise diffuse non-obstructive CAD -> PCI/DES LAD 1/21. RHC: RA 10 PA 53/30 (40) PCWP 28 CO/CI 4.2/2.1 PA sat 67%.   Echo 04/22/19 EF 30-35%   Referred to [facility] 04/2019 for further management of his systolic HF. Had cardiomems implanted in 7/22   Zio 4/21 SR w/ 1st AVB, avg HR 81 bpm, 1 brief 5 beat run of NSVT, Two runs SVT   Echo 8/22 EF 30-35% RV ok   Echo 1/23 EF 30-35%  Cardiomems was running high last week and torsemide increased to 80 bid. PAD on cardiomems 27 ->-> 15 ->-> 21-> 23 (on 11/7)  Labs on 11/3 Scr 1.73 -> 1.99  Here with his wife. Previously torsemide dropped back to 60 daily (? Bid) and metolazone added every Wednesday. Did not tolerate and developed AKI. Saw [name] recently and  torsemide increased from 60 bid to 80/60 for elevated CardioMems (20)  Here for f/u.  Seen in ED 12/10 with AMS and found to have flu A  ReDs 41%    SBP 121 when sitting 113 when standing.     Past Medical History:  Diagnosis Date   Arthritis    "probably in his legs" (08/15/2016)   Blind right eye    BPH (benign prostatic hyperplasia)    Breast asymmetry    Left breast is larger, present for several years.   CAD (coronary artery disease)    a. Cath in the late 90's - reportedly ok;  b. 2014 s/p stenting x 2 @ [facility]; c 08/19/16 Cath/PCI with DES -> RCA, plan to treat LM 70% medically. Seen by surgery and felt to be too high risk for CABG; d. 08/2018 Cath: LM 70 (iFR 0.86), LAD 20ost, 40p, 50/27m D1 70, LCX patent stent, OM1 30, RCA patent stent, 419mSR, 40d, RPDA 30. PCWP 8. CO/CI 4.0/2.1; e. 03/2019 PCI to LAD (2.5x15 Resolute Onyx DES).   Chronic combined systolic (congestive) and diastolic (congestive) heart failure ([facility]   a. Previously reduced EF-->50% by echo in 2012;  b. 06/2015 Echo: EF 50-55%  c. 07/2016 Echo: EF 45-50%; d. 12/2017 Echo: EF 55%; e. 08/2018 Echo: EF  35-40%; f. 04/2019 Echo: EF 30-35%; g. s/p cardiomems; h. 10/2020 Echo: EF 30-35%, mod-sev glob HK, gr1 DD.   Chronic kidney disease (CKD), stage IV (severe) (HCC)    CML (chronic myelocytic leukemia) (HCC)    GERD (gastroesophageal reflux disease)    GIB (gastrointestinal bleeding)    a. 12/2017 3 unit PRBC GIB in setting of coumadin-->Endo/colon multiple duodenal ulcers and a single bleeding ulcer in the proximal ascending colon status post hemostatic clipping x2.   Gout    Hyperlipemia    Hypertension    Hypothyroidism    Iron deficiency anemia    Ischemic cardiomyopathy    a. Previously reduced EF-->50% by echo in 2012;  b. 06/2015 Echo: EF 50-55%, Gr2 DD;  c. 07/2016 Echo: EF 45-50%; d. 12/2017 Echo: EF 55%, Gr1 DD, mild MR; e. 08/2018 Echo: EF 35-40%; f. 04/2019 Echo: EF 30-35%; g. 10/2020 Echo: EF 30-35%.   Migraine     "in the 1960s" (08/15/2016)   Obstructive sleep apnea    Orthostatic hypotension    a. 20222 -->carvedilol and hydralazine d/c'd.   PAF (paroxysmal atrial fibrillation) (HCC)    a. ? Dx 2014-->s/p DCCV;  b. CHA2DS2VASc = 6--> Coumadin.   Prostate cancer ([place])    RBBB    Type II diabetes mellitus [facility])    Past Surgical History:  Procedure Laterality Date   AMPUTATION TOE Right 02/23/2021   Procedure: AMPUTATION TOE;  Surgeon: Samara Deist, DPM;  Location: [facility] ORS;  Service: Podiatry;  Laterality: Right;   CATARACT EXTRACTION W/ INTRAOCULAR LENS  IMPLANT, BILATERAL Bilateral    COLONOSCOPY N/A 01/13/2018   Procedure: COLONOSCOPY;  Surgeon: Lin Landsman, MD;  Location: [facility] ENDOSCOPY;  Service: Gastroenterology;  Laterality: N/A;   COLONOSCOPY WITH PROPOFOL N/A 01/01/2018   Procedure: COLONOSCOPY WITH PROPOFOL;  Surgeon: Lin Landsman, MD;  Location: [facility] ENDOSCOPY;  Service: Gastroenterology;  Laterality: N/A;   CORONARY ANGIOPLASTY WITH STENT PLACEMENT  09/2012   2 stents   CORONARY STENT INTERVENTION N/A 08/19/2016   Procedure: Coronary Stent Intervention;  Surgeon: [name], MD;  Location: [place] CV LAB;  Service: Cardiovascular;  Laterality: N/A;   CORONARY STENT INTERVENTION N/A 04/13/2019   Procedure: CORONARY STENT INTERVENTION;  Surgeon: [name], MD;  Location: [place] CV LAB;  Service: Cardiovascular;  Laterality: N/A;   ESOPHAGEAL MANOMETRY N/A 12/16/2017   Procedure: ESOPHAGEAL MANOMETRY (EM);  Surgeon: Lin Landsman, MD;  Location: [facility] ENDOSCOPY;  Service: Gastroenterology;  Laterality: N/A;   ESOPHAGOGASTRODUODENOSCOPY N/A 12/20/2016   Procedure: ESOPHAGOGASTRODUODENOSCOPY (EGD);  Surgeon: Lin Landsman, MD;  Location: [facility] ENDOSCOPY;  Service: Gastroenterology;  Laterality: N/A;   ESOPHAGOGASTRODUODENOSCOPY N/A 01/13/2018   Procedure: ESOPHAGOGASTRODUODENOSCOPY (EGD);  Surgeon: Lin Landsman, MD;  Location: [facility]  ENDOSCOPY;  Service: Gastroenterology;  Laterality: N/A;   ESOPHAGOGASTRODUODENOSCOPY (EGD) WITH PROPOFOL N/A 11/03/2017   Procedure: ESOPHAGOGASTRODUODENOSCOPY (EGD) WITH PROPOFOL;  Surgeon: Lin Landsman, MD;  Location: [facility] ENDOSCOPY;  Service: Gastroenterology;  Laterality: N/A;   EYE SURGERY     HERNIA REPAIR     "navel"   LEFT HEART CATH AND CORONARY ANGIOGRAPHY N/A 08/14/2016   Procedure: Left Heart Cath and Coronary Angiography;  Surgeon: [name], MD;  Location: [place] CV LAB;  Service: Cardiovascular;  Laterality: N/A;   PRESSURE SENSOR/CARDIOMEMS N/A 09/23/2019   Procedure: PRESSURE SENSOR/CARDIOMEMS;  Surgeon: Jolaine Artist, MD;  Location: [place] CV LAB;  Service: Cardiovascular;  Laterality: N/A;   RIGHT HEART CATH N/A 09/23/2019  Procedure: RIGHT HEART CATH;  Surgeon: Jolaine Artist, MD;  Location: [place] CV LAB;  Service: Cardiovascular;  Laterality: N/A;   RIGHT/LEFT HEART CATH AND CORONARY ANGIOGRAPHY N/A 09/01/2018   Procedure: RIGHT/LEFT HEART CATH AND CORONARY ANGIOGRAPHY;  Surgeon: [name], MD;  Location: [place] CV LAB;  Service: Cardiovascular;  Laterality: N/A;   RIGHT/LEFT HEART CATH AND CORONARY ANGIOGRAPHY N/A 04/13/2019   Procedure: RIGHT/LEFT HEART CATH AND CORONARY ANGIOGRAPHY;  Surgeon: [name], MD;  Location: [place] CV LAB;  Service: Cardiovascular;  Laterality: N/A;   TOE AMPUTATION Right    "big toe"   Current Outpatient Medications  Medication Sig Dispense Refill   acetaminophen (TYLENOL) 500 MG tablet Take 1,000 mg by mouth every 6 (six) hours as needed for moderate pain or headache.      Alcohol Swabs (B-D SINGLE USE SWABS REGULAR) PADS Use to check blood sugar up to 2 times daily 200 each 3   allopurinol (ZYLOPRIM) 100 MG tablet Take 1 tablet (100 mg total) by mouth daily. 90 tablet 3   aspirin 81 MG chewable tablet Chew 81 mg by mouth daily.     Blood Glucose Calibration (OT ULTRA/FASTTK  CNTRL SOLN) SOLN      Blood Glucose Monitoring Suppl (ONE TOUCH ULTRA 2) w/Device KIT SMARTSIG:Via Meter     bosutinib (BOSULIF) 100 MG tablet Take 100 mg by mouth at bedtime. Take with food.     Cholecalciferol 25 MCG (1000 UT) capsule Take 1,000 Units by mouth daily.     famotidine (PEPCID) 40 MG tablet Take 1 tablet (40 mg total) by mouth at bedtime. 90 tablet 0   gabapentin (NEURONTIN) 600 MG tablet Take 1 tablet (600 mg total) by mouth 2 (two) times daily. 180 tablet 1   glucose blood (ONETOUCH VERIO) test strip USE TO TEST BLOOD SUGAR EVERY MORNING AND EVERY NIGHT AT BEDTIME 100 strip 0   Insulin Glargine ([name] KWIKPEN) 100 UNIT/ML Inject 22 Units into the skin daily.     Insulin Pen Needle 31G X 5 MM MISC Use to inject insulin daily. 100 each 3   isosorbide mononitrate (IMDUR) 60 MG 24 hr tablet Take 0.5 tablets (30 mg total) by mouth daily. 45 tablet 2   JARDIANCE 10 MG TABS tablet TAKE 1 TABLET(10 MG) BY MOUTH DAILY WITH BREAKFAST 90 tablet 0   Lancets (ONETOUCH DELICA PLUS [id number]) MISC Use to check blood sugar twice daily 200 each 3   levothyroxine (SYNTHROID) 25 MCG tablet TAKE 1 TABLET BY MOUTH EVERY MORNING BEFORE BREAKFAST 90 tablet 0   losartan (COZAAR) 25 MG tablet TAKE 1/2 TABLET(12.5 MG) BY MOUTH DAILY 45 tablet 1   Menthol-Zinc Oxide (GOLD BOND EX) Apply 1 application topically daily as needed (muscle pain).     metolazone (ZAROXOLYN) 2.5 MG tablet Take 1 tablet (2.5 mg total) by mouth once a week. Every Wednesday. Take this medication with potassium 10 tablet 3   mirabegron ER (MYRBETRIQ) 25 MG TB24 tablet Take 1 tablet (25 mg total) by mouth daily. 90 tablet 3   Multiple Vitamin (MULTIVITAMIN WITH MINERALS) TABS tablet Take 1 tablet by mouth daily.     nitroGLYCERIN (NITROSTAT) 0.4 MG SL tablet For chest pain, tightness, or pressure. While sitting, place 1 tablet under tongue. May be used every 5 minutes as needed, for up to 15 minutes. Do not use more than 3 tablets. 25  tablet 1   Polyethyl Glycol-Propyl Glycol (SYSTANE OP) Place 1 drop into both eyes 3 (  three) times daily as needed (dry/irritated eyes.).      polyethylene glycol (MIRALAX / GLYCOLAX) 17 g packet Take 17 g by mouth daily as needed for mild constipation.      potassium chloride SA (KLOR-CON M) 20 MEQ tablet Take 2 tablets (40 mEq total) by mouth once a week. Take with metolazone every Wednesday 10 tablet 3   ranolazine (RANEXA) 500 MG 12 hr tablet TAKE 1 TABLET BY MOUTH TWICE DAILY. PLEASE KEEP UPCOMING APPOINTMENT FOR FUTURE REFILLS 180 tablet 0   rosuvastatin (CRESTOR) 5 MG tablet Take 1 tablet (5 mg total) by mouth daily. 90 tablet 3   tamsulosin (FLOMAX) 0.4 MG CAPS capsule Take 1 capsule (0.4 mg total) by mouth daily after supper. 30 capsule 11   torsemide (DEMADEX) 20 MG tablet Take 3 tablets (60 mg total) by mouth 2 (two) times daily. 240 tablet 3   traMADol (ULTRAM) 50 MG tablet Take 1-2 tablets (50-100 mg total) by mouth at bedtime as needed. 30 tablet 2   warfarin (COUMADIN) 3 MG tablet TAKE 1 TO 1 & 1/2 TABLETS BY MOUTH AS DIRECTED BY THE COUMADIN CLINIC Strength: 3 mg 135 tablet 0   No current facility-administered medications for this visit.    Allergies:   Clopidogrel, Ciprofloxacin, Beta adrenergic blockers, Mirtazapine, and Spironolactone   Social History:  The patient  reports that he quit smoking about 48 years ago. His smoking use included cigarettes. He started smoking about 68 years ago. He has a 35.00 pack-year smoking history. He has quit using smokeless tobacco. He reports that he does not drink alcohol and does not use drugs.   Family History:  The patient's family history includes Brain cancer in his father; Diabetes in his sister; Heart attack in his sister.   ROS:  Please see the history of present illness.   All other systems are personally reviewed and negative.   BP 130/74 (BP Location: Right Arm, Patient Position: Sitting)   Pulse 88   Resp 16   Ht '5\' 4"'$  (1.626  m)   Wt 193 lb 4 oz (87.7 kg)   SpO2 99%   BMI 33.17 kg/m   Exam:   General:  Obese male Well appearing. No resp difficulty Neck: supple. no JVD. Carotids 2+ bilat; no bruits. No lymphadenopathy or thryomegaly appreciated. Cor: PMI nondisplaced. Regular rate & rhythm. No rubs, gallops or murmurs. Lungs: mild wheeze  Abdomen: soft, nontender, nondistended. No hepatosplenomegaly. No bruits or masses. Good bowel sounds. Extremities: no cyanosis, clubbing, rash, edema Neuro: alert & orientedx3, cranial nerves grossly intact. moves all 4 extremities w/o difficulty. Affect pleasant  12/05/2021: ALT 8; TSH 2.69 12/11/2021: Hemoglobin 14.1; Platelets 179 01/23/2022: B Natriuretic Peptide 164.6; BUN 29; Creatinine, Ser 1.69; Potassium 4.1; Sodium 138  Personally reviewed   Wt Readings from Last 3 Encounters:  02/05/22 193 lb 4 oz (87.7 kg)  01/23/22 196 lb 6 oz (89.1 kg)  12/13/21 200 lb (90.7 kg)      ASSESSMENT AND PLAN: 1. Chronic systolic HF: - likely combination of iCM/NICM - Echo (2/21): EF 30-35%  (was 35-40% in 6/20) - RHC (04/13/19): with elevated filling pressures but BNP normal  - Echo (8/22): EF 30-35%, grade I DD, RV low normal - Echo 1/23 EF 30-35% - Stable NYHA III - Feels worse today. Symptoms sound orthostatic but ReDs (41%) and cardiomems (19) are not c/w volume depletion. 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  - Continue losartan 12.5 mg daily (dose decreased due to low BP).  - Continue Jardiance 10 mg daily . - Labs today  - Wear compression socks - Refer PT/OT - Follow cardiomems daily.  2. CAD - Cath 04/13/19 LAD 70% with +FFR otherwise diffuse non-obstructive CAD  - S/p PCI/DES to LAD 1/21 - Followed by Dr. Saunders Revel - No s/s angina - Continue medical management.  3. CKD Stage 3b - baseline SCr ~1.7-2.0 - labs today - Hold metolazone - Avoid hypotension.  4.  DM  - Continue Jardiance 10 mg daily. - No GU symptoms  5. PAF - Zio patch 4/21 showed no AF. Mostly NSR w/ brief SVT/ NSVT, PACs and PVCs - No palpitations. - Continue warfarin.    Glori Bickers, MD  12:00 PM   [place] [address] [facility] [place] [address] [phone] (office) [phone] (fax)

## 2022-03-25 ENCOUNTER — Ambulatory Visit: Payer: Medicare HMO | Attending: Internal Medicine | Admitting: Internal Medicine

## 2022-03-25 ENCOUNTER — Encounter: Payer: Self-pay | Admitting: Internal Medicine

## 2022-03-25 VITALS — BP 98/60 | HR 84 | Resp 16 | Ht 65.0 in | Wt 199.0 lb

## 2022-03-25 DIAGNOSIS — Z87891 Personal history of nicotine dependence: Secondary | ICD-10-CM | POA: Diagnosis not present

## 2022-03-25 DIAGNOSIS — I2510 Atherosclerotic heart disease of native coronary artery without angina pectoris: Secondary | ICD-10-CM

## 2022-03-25 DIAGNOSIS — Z794 Long term (current) use of insulin: Secondary | ICD-10-CM | POA: Diagnosis not present

## 2022-03-25 DIAGNOSIS — I5022 Chronic systolic (congestive) heart failure: Secondary | ICD-10-CM

## 2022-03-25 DIAGNOSIS — E1122 Type 2 diabetes mellitus with diabetic chronic kidney disease: Secondary | ICD-10-CM | POA: Insufficient documentation

## 2022-03-25 DIAGNOSIS — I480 Paroxysmal atrial fibrillation: Secondary | ICD-10-CM | POA: Diagnosis not present

## 2022-03-25 DIAGNOSIS — N1832 Chronic kidney disease, stage 3b: Secondary | ICD-10-CM | POA: Diagnosis not present

## 2022-03-25 DIAGNOSIS — I453 Trifascicular block: Secondary | ICD-10-CM | POA: Insufficient documentation

## 2022-03-25 DIAGNOSIS — I130 Hypertensive heart and chronic kidney disease with heart failure and stage 1 through stage 4 chronic kidney disease, or unspecified chronic kidney disease: Secondary | ICD-10-CM | POA: Insufficient documentation

## 2022-03-25 DIAGNOSIS — Z79899 Other long term (current) drug therapy: Secondary | ICD-10-CM | POA: Diagnosis not present

## 2022-03-25 DIAGNOSIS — Z7984 Long term (current) use of oral hypoglycemic drugs: Secondary | ICD-10-CM | POA: Diagnosis not present

## 2022-03-25 DIAGNOSIS — Z833 Family history of diabetes mellitus: Secondary | ICD-10-CM | POA: Insufficient documentation

## 2022-03-25 DIAGNOSIS — Z7901 Long term (current) use of anticoagulants: Secondary | ICD-10-CM | POA: Insufficient documentation

## 2022-03-25 DIAGNOSIS — Z8249 Family history of ischemic heart disease and other diseases of the circulatory system: Secondary | ICD-10-CM | POA: Insufficient documentation

## 2022-03-25 DIAGNOSIS — R4182 Altered mental status, unspecified: Secondary | ICD-10-CM | POA: Diagnosis not present

## 2022-03-25 LAB — BASIC METABOLIC PANEL
Anion gap: 9 (ref 5–15)
BUN: 38 mg/dL — ABNORMAL HIGH (ref 8–23)
CO2: 26 mmol/L (ref 22–32)
Calcium: 9.2 mg/dL (ref 8.9–10.3)
Chloride: 103 mmol/L (ref 98–111)
Creatinine, Ser: 1.67 mg/dL — ABNORMAL HIGH (ref 0.61–1.24)
GFR, Estimated: 40 mL/min — ABNORMAL LOW (ref >60)
Glucose, Bld: 203 mg/dL — ABNORMAL HIGH (ref 70–99)
Potassium: 3.9 mmol/L (ref 3.5–5.1)
Sodium: 138 mmol/L (ref 135–145)

## 2022-03-25 LAB — BRAIN NATRIURETIC PEPTIDE: B Natriuretic Peptide: 180.6 pg/mL — ABNORMAL HIGH (ref 0.0–100.0)

## 2022-03-25 NOTE — Progress Notes (Unsigned)
ReDS Vest / Clip - 03/25/22 1136       ReDS Vest / Clip   Station Marker A    Ruler Value 25.5    ReDS Value Range High volume overload    ReDS Actual Value 46

## 2022-03-25 NOTE — Patient Instructions (Signed)
Medication Changes:  For weight gain greater than or equal to 194 lb on your  home scale: Take metolazone 2.5 mg with 40 meq potassium   Lab Work:  Labs done today, your results will be available in MyChart, we will contact you for abnormal readings.   Testing/Procedures:  None    Referrals:  None   Special Instructions // Education:  Do the following things EVERYDAY: Weigh yourself in the morning before breakfast. Write it down and keep it in a log. Take your medicines as prescribed Eat low salt foods--Limit salt (sodium) to 2000 mg per day.  Stay as active as you can everyday Limit all fluids for the day to less than 2 liters   Follow-Up in: 2 months with [name]. We will call you closer to your appointment date to schedule this.     If you have any questions or concerns before your next appointment please send [place] a message through [place] or call our office at [phone] Monday-Friday 8 am-5 pm.   If you have an urgent need after hours on the weekend please call your Primary Cardiologist or the [facility] in [place] at [phone].

## 2022-03-26 DIAGNOSIS — H5440 Blindness, one eye, unspecified eye: Secondary | ICD-10-CM | POA: Diagnosis not present

## 2022-03-26 DIAGNOSIS — E119 Type 2 diabetes mellitus without complications: Secondary | ICD-10-CM | POA: Diagnosis not present

## 2022-03-26 DIAGNOSIS — H353221 Exudative age-related macular degeneration, left eye, with active choroidal neovascularization: Secondary | ICD-10-CM | POA: Diagnosis not present

## 2022-03-28 ENCOUNTER — Ambulatory Visit: Admit: 2022-03-28 | Discharge: 2022-03-29 | Payer: MEDICARE

## 2022-03-28 ENCOUNTER — Other Ambulatory Visit: Admit: 2022-03-28 | Discharge: 2022-03-29 | Payer: MEDICARE

## 2022-03-28 ENCOUNTER — Encounter
Admit: 2022-03-28 | Discharge: 2022-03-29 | Payer: MEDICARE | Attending: Student in an Organized Health Care Education/Training Program | Primary: Student in an Organized Health Care Education/Training Program

## 2022-03-28 DIAGNOSIS — C9210 Chronic myeloid leukemia, BCR/ABL-positive, not having achieved remission: Principal | ICD-10-CM

## 2022-03-29 ENCOUNTER — Encounter: Payer: Self-pay | Admitting: Internal Medicine

## 2022-04-01 ENCOUNTER — Telehealth: Payer: Medicare HMO

## 2022-04-01 ENCOUNTER — Ambulatory Visit (INDEPENDENT_AMBULATORY_CARE_PROVIDER_SITE_OTHER): Payer: Medicare HMO

## 2022-04-01 ENCOUNTER — Ambulatory Visit: Payer: Medicare HMO | Admitting: Pharmacist

## 2022-04-01 DIAGNOSIS — Z794 Long term (current) use of insulin: Secondary | ICD-10-CM

## 2022-04-01 DIAGNOSIS — I5032 Chronic diastolic (congestive) heart failure: Secondary | ICD-10-CM

## 2022-04-01 DIAGNOSIS — I10 Essential (primary) hypertension: Secondary | ICD-10-CM

## 2022-04-01 DIAGNOSIS — I5022 Chronic systolic (congestive) heart failure: Secondary | ICD-10-CM

## 2022-04-01 MED ORDER — BASAGLAR KWIKPEN 100 UNIT/ML ~~LOC~~ SOPN: 22.0000 [IU] | PEN_INJECTOR | Freq: Every day | SUBCUTANEOUS | 0 refills | Status: DC

## 2022-04-01 NOTE — Chronic Care Management (AMB) (Signed)
Chronic Care Management   CCM RN Visit Note  04/01/2022 Name: Lucas Caldwell MRN: [id number] DOB: 11/17/1933  Subjective: Lucas Caldwell is a 89 y.o. year old male who is a primary care patient of [name], DO. The patient was referred to the Chronic Care Management team for assistance with care management needs subsequent to provider initiation of CCM services and plan of care.    Today's Visit:  Engaged with patient by telephone for follow up visit.        Goals Addressed             This Visit's Progress    CCM (DIABETES) EXPECTED OUTCOME: MONITOR, SELF-MANAGE AND REDUCE SYMPTOMS OF DIABETES       Current Barriers:  Knowledge Deficits related to the importance of blood sugars control in the patient with DM and multiple other chronic conditions Care Coordination needs related to ongoing support and education  in a patient with DM Chronic Disease Management support and education needs related to effective management of DM A1C goal of <8.0% Lab Results  Component Value Date   HGBA1C 8.0 (H) 12/05/2021     Planned Interventions: Provided education to patient about basic DM disease process. Review and education provided; Reviewed medications with patient and discussed importance of medication adherence. The patient is compliant with medications. The wife is calling today to see where his Nancee Liter is. She talked to [facility] last week and they told her it would be in by Friday and she did not receive it. She states that she is going to call them this am and see where it is because the patient needs it. Education and support given;        Reviewed prescribed diet with patient heart healthy/ADA. The patient and his wife are mindful of dietary restrictions. ; Counseled on importance of regular laboratory monitoring as prescribed;        Discussed plans with patient for ongoing care management follow up and provided patient with direct contact information for  care management team;      Provided patient with written educational materials related to hypo and hyperglycemia and importance of correct treatment;       Reviewed scheduled/upcoming provider appointments including: 06-05-2022,  has several with specialist;         Advised patient, providing education and rationale, to check cbg twice daily and when you have symptoms of low or high blood sugar and record. At the time of the call the patients wife had not taken the patients blood sugars, yesterday am it was 177 and then 197. She states hit will improve and be better as the day improves. Education given for the goal of fasting to be 130 of less and post prandial of <180.    call provider for findings outside established parameters;       Referral made to pharmacy team for assistance with ongoing support and education for medication needs., The patient works with the [facility] on a regular basis to support and provided ongoing help and education;       Review of patient status, including review of consultants reports, relevant laboratory and other test results, and medications completed;       Advised patient to discuss changes in DM and other questions and concerns with provider;      Screening for signs and symptoms of depression related to chronic disease state;        Assessed social determinant of health barriers;  Symptom Management: Take medications as prescribed   Attend all scheduled provider appointments Call provider office for new concerns or questions  call the Suicide and Crisis Lifeline: 988 call the [place] National Suicide Prevention Lifeline: (629) 774-0467 or TTY: (775)515-5930 TTY (828)128-2740) to talk to a trained counselor call 1-800-273-TALK (toll free, 24 hour hotline) if experiencing a Mental Health or [place]  check feet daily for cuts, sores or redness trim toenails straight across wash and dry feet carefully every day wear comfortable, cotton  socks wear comfortable, well-fitting shoes  Follow Up Plan: Telephone follow up appointment with care management team member scheduled for: 06-10-2022 at 0900 am       CCM Expected Outcome:  Monitor, Self-Manage and Reduce Symptoms of Heart Failure       Current Barriers:  Knowledge Deficits related to the need to keep weights in balance and to monitor for changes that impact heart failure and cause exacerbations Care Coordination needs related to ongoing support and education needs in a patient with CHF Chronic Disease Management support and education needs related to effective management of CHF  Wt Readings from Last 3 Encounters:  03/25/22 199 lb (90.3 kg)  02/25/22 196 lb (88.9 kg)  02/24/22 196 lb (88.9 kg)    Planned Interventions: Basic overview and discussion of pathophysiology of Heart Failure reviewed Provided education on low sodium diet. The patient wife states they are really watching his sodium content in foods Reviewed Heart Failure Action Plan in depth and provided written copy Assessed need for readable accurate scales in home. The patient has a mat that he weighs on daily that reports his weight to the heart failure clinic. His weight is staying stable. He has parameters to go by to make sure he does not go into exacerbation. He and his wife have a good understanding of how to effectively manage his heart failure.  Provided education about placing scale on hard, flat surface Advised patient to weigh each morning after emptying bladder Discussed importance of daily weight and advised patient to weigh and record daily Reviewed role of diuretics in prevention of fluid overload and management of heart failure. The patient is working with the cardiologist for trying to figure out the best way to keep fluids and weight balanced. [name] compliant with medications. The wife knows to call for acute changes and get recommendations from the provider Discussed the importance of keeping  all appointments with provider. The patient saw the cardiologist on 03-25-2022. Sees on a regular basis. Knows to call for changes on new concerns Advised patient to discuss changes in heart failure with provider Screening for signs and symptoms of depression related to chronic disease state  Assessed social determinant of health barriers  Symptom Management: Take medications as prescribed   Attend all scheduled provider appointments Call provider office for new concerns or questions  call the Suicide and Crisis Lifeline: 988 call the [place] National Suicide Prevention Lifeline: (608) 708-2809 or TTY: 513-192-8000 TTY 978-563-2135) to talk to a trained counselor call 1-800-273-TALK (toll free, 24 hour hotline) if experiencing a Mental Health or [place]  call office if I gain more than 2 pounds in one day or 5 pounds in one week track weight in diary use salt in moderation watch for swelling in feet, ankles and legs every day weigh myself daily develop a rescue plan follow rescue plan if symptoms flare-up track symptoms and what helps feel better or worse  Follow Up Plan: Telephone follow up appointment  with care management team member scheduled for: 06-10-2022 at 09 am       CCM Expected Outcome:  Monitor, Self-Manage, and Reduce Symptoms of Hypertension       Current Barriers:  Chronic Disease Management support and education needs related to effective management of HTN BP Readings from Last 3 Encounters:  03/25/22 98/60  02/25/22 102/86  02/20/22 128/71     Planned Interventions: Evaluation of current treatment plan related to hypertension self management and patient's adherence to plan as established by provider. The patient has been having some low blood pressure readings. The patients wife states that he is doing better since he had the flu and denies any new issues with his blood pressure. She is monitoring at home. Education and support give. Saw the cardiologist  on 03-25-2022 and pcp on 02-25-2022;   Provided education to patient re: stroke prevention, s/s of heart attack and stroke; Reviewed prescribed diet heart healthy/ADA diet. The patient has a good appetite and is eating well. [name] mindful of dietary restrictions and follows a heart healthy/ADA diet  Reviewed medications with patient and discussed importance of compliance. The patient is compliant with medications. ;  Discussed plans with patient for ongoing care management follow up and provided patient with direct contact information for care management team; Advised patient, providing education and rationale, to monitor blood pressure daily and record, calling PCP for findings outside established parameters;  Advised patient to discuss changes in blood pressures and heart health with provider; Provided education on prescribed diet heart healthy/ADA diet ;  Discussed complications of poorly controlled blood pressure such as heart disease, stroke, circulatory complications, vision complications, kidney impairment, sexual dysfunction;  Screening for signs and symptoms of depression related to chronic disease state;  Assessed social determinant of health barriers;   Symptom Management: Take medications as prescribed   Attend all scheduled provider appointments Call provider office for new concerns or questions  call the Suicide and Crisis Lifeline: 988 call the [place] National Suicide Prevention Lifeline: 402-393-1391 or TTY: (782)161-7148 TTY 825-836-3407) to talk to a trained counselor call 1-800-273-TALK (toll free, 24 hour hotline) if experiencing a Mental Health or [place]  check blood pressure 3 times per week write blood pressure results in a log or diary learn about high blood pressure keep a blood pressure log take blood pressure log to all doctor appointments call doctor for signs and symptoms of high blood pressure develop an action plan for high blood pressure keep  all doctor appointments take medications for blood pressure exactly as prescribed report new symptoms to your doctor  Follow Up Plan: Telephone follow up appointment with care management team member scheduled for: 06-10-2022 at 0900 am          Plan:Telephone follow up appointment with care management team member scheduled for:  06-10-2022 at 0900 am  Noreene Larsson RN, MSN, CCM RN Care Manager  Chronic Care Management Direct Number: [phone]

## 2022-04-01 NOTE — Patient Instructions (Signed)
Visit Information  Thank you for taking time to visit with me today. Please don't hesitate to contact me if I can be of assistance to you before our next scheduled telephone appointment.  Following are the goals we discussed today:   Goals Addressed             This Visit's Progress    Pharmacy Goals       Please check your blood sugar, keep a log of the results and bring this with you to your medical appointments.  Please check your home blood pressure, keep a log of the results and bring this with you to your medical appointments.  Our goal bad cholesterol, or LDL, is less than 70 . This is why it is important to continue taking your rosuvastatin.  Feel free to call me with any questions or concerns. I look forward to our next call!  [name], PharmD, [place], CPP Clinical Pharmacist [facility] [phone]          Our next appointment is by telephone on 04/15/2022 at 10:00 AM  Please call the care guide team at [phone] if you need to cancel or reschedule your appointment.    The patient verbalized understanding of instructions, educational materials, and care plan provided today and DECLINED offer to receive copy of patient instructions, educational materials, and care plan.

## 2022-04-01 NOTE — Chronic Care Management (AMB) (Signed)
Chronic Care Management CCM Pharmacy Note  04/01/2022 Name:  Lucas Caldwell MRN:  [id number] DOB:  [birth year]   Subjective: Lucas Caldwell is an 89 y.o. year old male who is a primary patient of [name], DO.  The CCM team was consulted for assistance with disease management and care coordination needs.    Receive a voicemail from patient's wife requesting a call back.  Engaged with patient/spouse by telephone for follow up visit for pharmacy case management and/or care coordination services.   Objective:  Medications Reviewed Today     Reviewed by Vanita Ingles, RN (Case Manager) on 04/01/22 at [phone]  Med List Status: <None>   Medication Order Taking? Sig Documenting Provider Last Dose Status Informant  acetaminophen (TYLENOL) 500 MG tablet [id number] No Take 1,000 mg by mouth every 6 (six) hours as needed for moderate pain or headache.  [provider] Taking Active Spouse/Significant Other  Alcohol Swabs (B-D SINGLE USE SWABS REGULAR) PADS [id number] No Use to check blood sugar up to 2 times daily [name], DO Taking Active Spouse/Significant Other  allopurinol (ZYLOPRIM) 100 MG tablet [id number] No Take 1 tablet (100 mg total) by mouth daily. Theora Gianotti, NP Taking Active   aspirin 81 MG chewable tablet [id number] No Chew 81 mg by mouth daily. [provider] Taking Active Spouse/Significant Other  Blood Glucose Calibration (OT ULTRA/FASTTK CNTRL SOLN) SOLN [id number] No  [provider] Taking Active   Blood Glucose Monitoring Suppl (ONE TOUCH ULTRA 2) w/Device KIT [id number] No SMARTSIG:Via Meter [provider] Taking Active   bosutinib (BOSULIF) 100 MG tablet [id number] No Take 100 mg by mouth at bedtime. Take with food. [provider] Taking Active Spouse/Significant Other           Med Note Jeanie Cooks Sep 15, 2019  8:38 AM)    Cholecalciferol 25 MCG (1000 UT) capsule  [id number] No Take 1,000 Units by mouth daily. [provider] Taking Active Spouse/Significant Other  famotidine (PEPCID) 40 MG tablet [id number] No Take 1 tablet (40 mg total) by mouth at bedtime. [name], DO Taking Active   gabapentin (NEURONTIN) 600 MG tablet [id number] No Take 1 tablet (600 mg total) by mouth 2 (two) times daily. [name], DO Taking Active   glucose blood [facility] VERIO) test strip [id number] No USE TO TEST BLOOD SUGAR EVERY MORNING AND EVERY NIGHT AT BEDTIME [name], DO Taking Active   Insulin Glargine ([name] KWIKPEN) 100 UNIT/ML [id number] No Inject 22 Units into the skin daily. [provider] Taking Active   Insulin Pen Needle 31G X 5 MM MISC [id number] No Use to inject insulin daily. [name], DO Taking Active   isosorbide mononitrate (IMDUR) 60 MG 24 hr tablet [id number] No Take 0.5 tablets (30 mg total) by mouth daily. [name], MD Taking Active   JARDIANCE 10 MG TABS tablet [id number] No TAKE 1 TABLET(10 MG) BY MOUTH DAILY WITH BREAKFAST [place], [name], DO Taking Active   Lancets (ONETOUCH DELICA PLUS PVXYIA16P) [place] [id number] No Use to check blood sugar twice daily [name], DO Taking Active   levothyroxine (SYNTHROID) 25 MCG tablet [id number] No TAKE 1 TABLET BY MOUTH EVERY MORNING BEFORE BREAKFAST Karamalegos, [name], DO Taking Active   losartan (COZAAR) 25 MG tablet [id number] No TAKE 1/2 TABLET(12.5 MG) BY MOUTH DAILY End, [name], MD Taking Active   Menthol-Zinc Oxide (GOLD [place]) [id number] No Apply  1 application topically daily as needed (muscle pain). [provider] Taking Active Spouse/Significant Other  metolazone (ZAROXOLYN) 2.5 MG tablet [id number] No Take 1 tablet (2.5 mg total) by mouth once a week. Every Wednesday. Take this medication with potassium Bensimhon, Shaune Pascal, MD Taking Active   mirabegron ER (MYRBETRIQ) 25 MG TB24  tablet [id number] No Take 1 tablet (25 mg total) by mouth daily. Debroah Loop, PA-C Taking Active   Multiple Vitamin (MULTIVITAMIN WITH MINERALS) TABS tablet [id number] No Take 1 tablet by mouth daily. [provider] Taking Active Spouse/Significant Other  nitroGLYCERIN (NITROSTAT) 0.4 MG SL tablet [id number] No For chest pain, tightness, or pressure. While sitting, place 1 tablet under tongue. May be used every 5 minutes as needed, for up to 15 minutes. Do not use more than 3 tablets. End, [name], MD Taking Active   Polyethyl Glycol-Propyl Glycol [facility] OP) [id number] No Place 1 drop into both eyes 3 (three) times daily as needed (dry/irritated eyes.).  [provider] Taking Active Spouse/Significant Other  polyethylene glycol (MIRALAX / GLYCOLAX) 17 g packet [id number] No Take 17 g by mouth daily as needed for mild constipation.  [provider] Taking Active Spouse/Significant Other  potassium chloride SA (KLOR-CON M) 20 MEQ tablet [id number] No Take 2 tablets (40 mEq total) by mouth once a week. Take with metolazone every Wednesday Bensimhon, Shaune Pascal, MD Taking Active   ranolazine (RANEXA) 500 MG 12 hr tablet [id number] No Take 1 tablet (500 mg total) by mouth 2 (two) times daily. End, [name], MD Taking Active   rosuvastatin (CRESTOR) 5 MG tablet [id number] No Take 1 tablet (5 mg total) by mouth daily. End, [name], MD Taking Active   tamsulosin (FLOMAX) 0.4 MG CAPS capsule [id number] No Take 1 capsule (0.4 mg total) by mouth daily after supper. Rise Mu, PA-C Taking Active Spouse/Significant Other  torsemide (DEMADEX) 20 MG tablet [id number] No Take 3 tablets (60 mg total) by mouth 2 (two) times daily. Bensimhon, Shaune Pascal, MD Taking Active   traMADol (ULTRAM) 50 MG tablet [id number] No Take 1-2 tablets (50-100 mg total) by mouth at bedtime as needed. [name], DO Taking Active Spouse/Significant Other  warfarin (COUMADIN) 3 MG tablet  [id number] No TAKE 1 TO 1 & 1/2 TABLETS BY MOUTH AS DIRECTED BY THE COUMADIN CLINIC Strength: 3 mg End, [name], MD Taking Active             Pertinent Labs:  Lab Results  Component Value Date   HGBA1C 8.0 (H) 12/05/2021   Lab Results  Component Value Date   CREATININE 1.67 (H) 03/25/2022   BUN 38 (H) 03/25/2022   NA 138 03/25/2022   K 3.9 03/25/2022   CL 103 03/25/2022   CO2 26 03/25/2022    SDOH:  (Social Determinants of Health) assessments and interventions performed:  SDOH Interventions    Flowsheet Row Chronic Care Management from 01/28/2022 in [facility] Coordination from 11/05/2021 in [place] Coordination Office Visit from 10/05/2021 in [place] from 09/03/2021 in [place] Management from 10/02/2020 in [facility] Office Visit from 09/08/2019 in [place]  SDOH Interventions        Food Insecurity Interventions -- Intervention Not Indicated -- Intervention Not Indicated -- --  Housing Interventions -- Intervention Not Indicated -- Intervention Not Indicated -- --  Transportation Interventions -- Intervention Not Indicated -- Intervention Not Indicated -- --  Utilities Interventions Intervention Not Indicated -- -- -- -- --  Depression Interventions/Treatment  -- -- Counseling -- -- Counseling  Financial Strain Interventions -- Intervention Not Indicated -- Intervention Not Indicated -- --  Physical Activity Interventions -- Other (Comments)  [gets really short of breath due to chronic conditions] -- -- Other (Comments)  [gets easily short of breath. Limited to the activity he can do] --  Stress Interventions -- -- -- Intervention Not Indicated -- --  Social Connections Interventions -- -- -- Intervention Not Indicated -- --       CCM Care Plan  Review of patient past medical history, allergies, medications, health  status, including review of consultants reports, laboratory and other test data, was performed as part of comprehensive evaluation and provision of chronic care management services.   Care Plan : PharmD - Medication Management/Assistance  Updates made by Rennis Petty, RPH-CPP since 04/01/2022 12:00 AM     Problem: Disease Progression      Long-Range Goal: Disease Progression Prevented or Minimized   Start Date: 04/14/2020  Expected End Date: 07/13/2020  Recent Progress: On track  Priority: High  Note:   Current Barriers:  Chronic Disease Management support, education, and care coordination needs related to type 2 diabetes, CKD, HFrEF s/p CardioMEMS, hyperlipidemia, atrial fibrillation, hypertension, coronary artery disease, CHF, hypothyroidism, and CML Financial Note patient has Partial Extra Help/LIS Subsidy Patient enrolled in [place] patient assistance program for 2023 calendar year Patient denied for patient assistance for [place] from [facility] for 2022 calendar year Patient previously enrolled in [place] for [employer] copayment assistance, but eligibility ended as not in use ($0 copayment through Part D plan coverage for Jardiance) Limited vision  Pharmacist Clinical Goal(s):  Over the next 90 days, patient will verbalize ability to afford treatment regimen through collaboration with PharmD and provider.   Interventions: 1:1 collaboration with [name], DO regarding development and update of comprehensive plan of care as evidenced by provider attestation and co-signature Inter-disciplinary care team collaboration (see longitudinal plan of care) Today receive a call from patient's wife requesting a call back regarding patient assistance for patient's [name] insulin   Medication Assistance/T2DM: Collaborated with [facility] CPh[facility] for assistance to patient with [employer] patient assistance program application for 7628 calendar  year Note from outreach to [place] (Visual merchandiser for [employer]) on 03/15/2022, confirmed patient is APPROVED for re-enrollment in assistance program from 03/18/2022-03/18/2023  Today patient's wife states she called call [facility] over a week ago and was advised that his [name] refill would be shipped to their home by 03/29/2022, but today refill still not received and patient now out of his [place] Reports morning fasting blood sugar today: 177 Outreach to [employer] on behalf of patient today. Speak with representative who advises there was a delay in processing, but will expedite shipment to patient for overnight to patient to be received by 1/17. CPP will send a 30 day supply of patient's [name] Rx to patient's [place] while patient waiting on medication to come from patient assistance program Counsel to continue to monitor home blood sugar, keep log of results, have this record to review during upcoming appointments and to call office sooner if needed for readings outside of established parameters or for new symptoms  Patient Goals/Self-Care Activities Over the next 90 days, patient will:  Check blood sugar, blood pressure and fluid status as directed by providers  Take medications, with assistance of wife, as directed Mrs. Yono manages patient's  medications using weekly pillbox as adherence tool Attends scheduled medical appointments        Plan: Telephone follow up appointment with care management team member scheduled for:  04/15/2022 at 10:00 AM  [name], PharmD, Para March, [place] [phone]

## 2022-04-01 NOTE — Patient Instructions (Signed)
Please call the care guide team at [phone] if you need to cancel or reschedule your appointment.   If you are experiencing a Mental Health or [place] or need someone to talk to, please call the Suicide and Crisis Lifeline: 988 call the [place] National Suicide Prevention Lifeline: 585-541-8607 or TTY: 267-686-3551 TTY [phone]) to talk to a trained counselor call 1-800-273-TALK (toll free, 24 hour hotline)   Following is a copy of the CCM Program Consent:  CCM service includes personalized support from designated clinical staff supervised by the physician, including individualized plan of care and coordination with other care providers 24/7 contact phone numbers for assistance for urgent and routine care needs. Service will only be billed when office clinical staff spend 20 minutes or more in a month to coordinate care. Only one practitioner may furnish and bill the service in a calendar month. The patient may stop CCM services at amy time (effective at the end of the month) by phone call to the office staff. The patient will be responsible for cost sharing (co-pay) or up to 20% of the service fee (after annual deductible is met)  Following is a copy of your full provider care plan:   Goals Addressed             This Visit's Progress    CCM (DIABETES) EXPECTED OUTCOME: MONITOR, SELF-MANAGE AND REDUCE SYMPTOMS OF DIABETES       Current Barriers:  Knowledge Deficits related to the importance of blood sugars control in the patient with DM and multiple other chronic conditions Care Coordination needs related to ongoing support and education  in a patient with DM Chronic Disease Management support and education needs related to effective management of DM A1C goal of <8.0% Lab Results  Component Value Date   HGBA1C 8.0 (H) 12/05/2021     Planned Interventions: Provided education to patient about basic DM disease process. Review and education provided; Reviewed  medications with patient and discussed importance of medication adherence. The patient is compliant with medications. The wife is calling today to see where his Nancee Liter is. She talked to [facility] last week and they told her it would be in by Friday and she did not receive it. She states that she is going to call them this am and see where it is because the patient needs it. Education and support given;        Reviewed prescribed diet with patient heart healthy/ADA. The patient and his wife are mindful of dietary restrictions. ; Counseled on importance of regular laboratory monitoring as prescribed;        Discussed plans with patient for ongoing care management follow up and provided patient with direct contact information for care management team;      Provided patient with written educational materials related to hypo and hyperglycemia and importance of correct treatment;       Reviewed scheduled/upcoming provider appointments including: 06-05-2022,  has several with specialist;         Advised patient, providing education and rationale, to check cbg twice daily and when you have symptoms of low or high blood sugar and record. At the time of the call the patients wife had not taken the patients blood sugars, yesterday am it was 177 and then 197. She states hit will improve and be better as the day improves. Education given for the goal of fasting to be 130 of less and post prandial of <180.    call provider for findings outside  established parameters;       Referral made to pharmacy team for assistance with ongoing support and education for medication needs., The patient works with the [facility] on a regular basis to support and provided ongoing help and education;       Review of patient status, including review of consultants reports, relevant laboratory and other test results, and medications completed;       Advised patient to discuss changes in DM and other questions and concerns with provider;       Screening for signs and symptoms of depression related to chronic disease state;        Assessed social determinant of health barriers;         Symptom Management: Take medications as prescribed   Attend all scheduled provider appointments Call provider office for new concerns or questions  call the Suicide and Crisis Lifeline: 988 call the [place] National Suicide Prevention Lifeline: 9025632587 or TTY: (248) 015-3426 TTY (239)508-8242) to talk to a trained counselor call 1-800-273-TALK (toll free, 24 hour hotline) if experiencing a Mental Health or [place]  check feet daily for cuts, sores or redness trim toenails straight across wash and dry feet carefully every day wear comfortable, cotton socks wear comfortable, well-fitting shoes  Follow Up Plan: Telephone follow up appointment with care management team member scheduled for: 06-10-2022 at 0900 am       CCM Expected Outcome:  Monitor, Self-Manage and Reduce Symptoms of Heart Failure       Current Barriers:  Knowledge Deficits related to the need to keep weights in balance and to monitor for changes that impact heart failure and cause exacerbations Care Coordination needs related to ongoing support and education needs in a patient with CHF Chronic Disease Management support and education needs related to effective management of CHF  Wt Readings from Last 3 Encounters:  03/25/22 199 lb (90.3 kg)  02/25/22 196 lb (88.9 kg)  02/24/22 196 lb (88.9 kg)    Planned Interventions: Basic overview and discussion of pathophysiology of Heart Failure reviewed Provided education on low sodium diet. The patient wife states they are really watching his sodium content in foods Reviewed Heart Failure Action Plan in depth and provided written copy Assessed need for readable accurate scales in home. The patient has a mat that he weighs on daily that reports his weight to the heart failure clinic. His weight is staying stable. He  has parameters to go by to make sure he does not go into exacerbation. He and his wife have a good understanding of how to effectively manage his heart failure.  Provided education about placing scale on hard, flat surface Advised patient to weigh each morning after emptying bladder Discussed importance of daily weight and advised patient to weigh and record daily Reviewed role of diuretics in prevention of fluid overload and management of heart failure. The patient is working with the cardiologist for trying to figure out the best way to keep fluids and weight balanced. [name] compliant with medications. The wife knows to call for acute changes and get recommendations from the provider Discussed the importance of keeping all appointments with provider. The patient saw the cardiologist on 03-25-2022. Sees on a regular basis. Knows to call for changes on new concerns Advised patient to discuss changes in heart failure with provider Screening for signs and symptoms of depression related to chronic disease state  Assessed social determinant of health barriers  Symptom Management: Take medications as prescribed  Attend all scheduled provider appointments Call provider office for new concerns or questions  call the Suicide and Crisis Lifeline: 988 call the [place] National Suicide Prevention Lifeline: 929-468-7168 or TTY: (418)872-6505 TTY (772)600-4315) to talk to a trained counselor call 1-800-273-TALK (toll free, 24 hour hotline) if experiencing a Mental Health or [place]  call office if I gain more than 2 pounds in one day or 5 pounds in one week track weight in diary use salt in moderation watch for swelling in feet, ankles and legs every day weigh myself daily develop a rescue plan follow rescue plan if symptoms flare-up track symptoms and what helps feel better or worse  Follow Up Plan: Telephone follow up appointment with care management team member scheduled for: 06-10-2022  at 09 am       CCM Expected Outcome:  Monitor, Self-Manage, and Reduce Symptoms of Hypertension       Current Barriers:  Chronic Disease Management support and education needs related to effective management of HTN BP Readings from Last 3 Encounters:  03/25/22 98/60  02/25/22 102/86  02/20/22 128/71     Planned Interventions: Evaluation of current treatment plan related to hypertension self management and patient's adherence to plan as established by provider. The patient has been having some low blood pressure readings. The patients wife states that he is doing better since he had the flu and denies any new issues with his blood pressure. She is monitoring at home. Education and support give. Saw the cardiologist on 03-25-2022 and pcp on 02-25-2022;   Provided education to patient re: stroke prevention, s/s of heart attack and stroke; Reviewed prescribed diet heart healthy/ADA diet. The patient has a good appetite and is eating well. [name] mindful of dietary restrictions and follows a heart healthy/ADA diet  Reviewed medications with patient and discussed importance of compliance. The patient is compliant with medications. ;  Discussed plans with patient for ongoing care management follow up and provided patient with direct contact information for care management team; Advised patient, providing education and rationale, to monitor blood pressure daily and record, calling PCP for findings outside established parameters;  Advised patient to discuss changes in blood pressures and heart health with provider; Provided education on prescribed diet heart healthy/ADA diet ;  Discussed complications of poorly controlled blood pressure such as heart disease, stroke, circulatory complications, vision complications, kidney impairment, sexual dysfunction;  Screening for signs and symptoms of depression related to chronic disease state;  Assessed social determinant of health barriers;   Symptom  Management: Take medications as prescribed   Attend all scheduled provider appointments Call provider office for new concerns or questions  call the Suicide and Crisis Lifeline: 988 call the [place] National Suicide Prevention Lifeline: 225-711-5765 or TTY: 617-264-4202 TTY 540-798-0781) to talk to a trained counselor call 1-800-273-TALK (toll free, 24 hour hotline) if experiencing a Mental Health or [place]  check blood pressure 3 times per week write blood pressure results in a log or diary learn about high blood pressure keep a blood pressure log take blood pressure log to all doctor appointments call doctor for signs and symptoms of high blood pressure develop an action plan for high blood pressure keep all doctor appointments take medications for blood pressure exactly as prescribed report new symptoms to your doctor  Follow Up Plan: Telephone follow up appointment with care management team member scheduled for: 06-10-2022 at 0900 am          The patient verbalized  understanding of instructions, educational materials, and care plan provided today and DECLINED offer to receive copy of patient instructions, educational materials, and care plan.   Telephone follow up appointment with care management team member scheduled for: 06-10-2022 at 0900 am

## 2022-04-04 ENCOUNTER — Encounter
Admit: 2022-04-04 | Discharge: 2022-04-04 | Payer: MEDICARE | Attending: Student in an Organized Health Care Education/Training Program | Primary: Student in an Organized Health Care Education/Training Program

## 2022-04-04 DIAGNOSIS — C9210 Chronic myeloid leukemia, BCR/ABL-positive, not having achieved remission: Secondary | ICD-10-CM | POA: Diagnosis not present

## 2022-04-12 ENCOUNTER — Other Ambulatory Visit: Payer: Self-pay | Admitting: Family Medicine

## 2022-04-12 DIAGNOSIS — E1121 Type 2 diabetes mellitus with diabetic nephropathy: Secondary | ICD-10-CM

## 2022-04-12 DIAGNOSIS — E039 Hypothyroidism, unspecified: Secondary | ICD-10-CM

## 2022-04-12 NOTE — Telephone Encounter (Signed)
Requested Prescriptions  Pending Prescriptions Disp Refills   JARDIANCE 10 MG TABS tablet [Pharmacy Med Name: JARDIANCE '10MG'$  TABLETS] 90 tablet 1    Sig: TAKE 1 TABLET(10 MG) BY MOUTH DAILY WITH BREAKFAST     Endocrinology:  Diabetes - SGLT2 Inhibitors Failed - 04/12/2022  6:31 AM      Failed - Cr in normal range and within 360 days    Creat  Date Value Ref Range Status  12/05/2021 1.96 (H) 0.70 - 1.22 mg/dL Final   Creatinine, Ser  Date Value Ref Range Status  03/25/2022 1.67 (H) 0.61 - 1.24 mg/dL Final   Creatinine, Urine  Date Value Ref Range Status  12/18/2016 96 mg/dL Final         Failed - HBA1C is between 0 and 7.9 and within 180 days    Hgb A1c MFr Bld  Date Value Ref Range Status  12/05/2021 8.0 (H) <5.7 % of total Hgb Final    Comment:    For someone without known diabetes, a hemoglobin A1c value of 6.5% or greater indicates that they may have  diabetes and this should be confirmed with a follow-up  test. . For someone with known diabetes, a value <7% indicates  that their diabetes is well controlled and a value  greater than or equal to 7% indicates suboptimal  control. A1c targets should be individualized based on  duration of diabetes, age, comorbid conditions, and  other considerations. . Currently, no consensus exists regarding use of hemoglobin A1c for diagnosis of diabetes for children. .          Failed - eGFR in normal range and within 360 days    GFR, Est African American  Date Value Ref Range Status  04/13/2020 41 (L) > OR = 60 mL/min/1.63m Final   GFR, Est Non African American  Date Value Ref Range Status  04/13/2020 36 (L) > OR = 60 mL/min/1.758mFinal   GFR, Estimated  Date Value Ref Range Status  03/25/2022 40 (L) >60 mL/min Final    Comment:    (NOTE) Calculated using the CKD-EPI Creatinine Equation (2021)    eGFR  Date Value Ref Range Status  12/05/2021 33 (L) > OR = 60 mL/min/1.7349minal  05/16/2021 55 (L) >59 mL/min/1.73  Final         Passed - Valid encounter within last 6 months    Recent Outpatient Visits           1 month ago Influenza A  [place]   4 months ago [facility]   4 months ago [facility] of left [facility]   [facility]   6 months ago Controlled type 2 diabetes mellitus with diabetic nephropathy, with long-term current use of insulin [facility] [place]   9 months ago Controlled type 2 diabetes mellitus with diabetic nephropathy, with long-term current use of [facility] [place]       Future Appointments             In 1 month [facility] [facility]             levothyroxine (SYNTHROID) 25 MCG tablet [PhGardnersd Name:  LEVOTHYROXINE 0.'025MG'$  (25MCG) TAB] 90 tablet 2    Sig: TAKE 1 TABLET BY MOUTH EVERY MORNING BEFORE BREAKFAST     Endocrinology:  Hypothyroid Agents Passed - 04/12/2022  6:31 AM      Passed - TSH in normal range and within 360 days    TSH  Date Value Ref Range Status  02/05/2022 3.204 0.350 - 4.500 uIU/mL Final    Comment:    Performed by a 3rd Generation assay with a functional sensitivity of <=0.01 uIU/mL. Performed at [facility], [address] [place], [place] [address]   12/05/2021 2.69 0.40 - 4.50 mIU/L Final         Passed - Valid encounter within last 12 months    Recent Outpatient Visits           1 month ago Influenza [place], DO   4 months ago Annual physical exam   [facility] [name], DO   4 months ago Acute pain of left shoulder   [place], Erin E, PA-C   6 months ago Controlled type 2 diabetes mellitus with diabetic nephropathy, with long-term current use of insulin [facility])   [place], DO   9 months ago Controlled type 2 diabetes mellitus with diabetic nephropathy, with long-term current use of insulin [facility])   [place], DO       Future Appointments             In 1 month [place], [name], DO [facility]

## 2022-04-15 ENCOUNTER — Ambulatory Visit: Payer: Medicare HMO | Admitting: Pharmacist

## 2022-04-15 ENCOUNTER — Other Ambulatory Visit: Payer: Self-pay | Admitting: Internal Medicine

## 2022-04-15 ENCOUNTER — Other Ambulatory Visit: Payer: Self-pay | Admitting: Family Medicine

## 2022-04-15 DIAGNOSIS — K219 Gastro-esophageal reflux disease without esophagitis: Secondary | ICD-10-CM

## 2022-04-15 DIAGNOSIS — Z794 Long term (current) use of insulin: Secondary | ICD-10-CM

## 2022-04-15 NOTE — Patient Instructions (Signed)
Visit Information  Thank you for taking time to visit with me today. Please don't hesitate to contact me if I can be of assistance to you before our next scheduled telephone appointment.  Following are the goals we discussed today:   Goals Addressed             This Visit's Progress    Pharmacy Goals       Please check your blood sugar, keep a log of the results and bring this with you to your medical appointments.  Please check your home blood pressure, keep a log of the results and bring this with you to your medical appointments.  Our goal bad cholesterol, or LDL, is less than 70 . This is why it is important to continue taking your rosuvastatin.  Feel free to call me with any questions or concerns. I look forward to our next call!   [name], PharmD, Para [place], CPP Clinical Pharmacist [facility] [phone]          Our next appointment is by telephone on 05/13/2022 at 10 am  Please call the care guide team at [phone] if you need to cancel or reschedule your appointment.    The patient verbalized understanding of instructions, educational materials, and care plan provided today and DECLINED offer to receive copy of patient instructions, educational materials, and care plan.

## 2022-04-15 NOTE — Chronic Care Management (AMB) (Signed)
Chronic Care Management CCM Pharmacy Note  04/15/2022 Name:  Lucas Caldwell MRN:  [id number] DOB:  1933-10-15   Subjective: Lucas Caldwell is an 89 y.o. year old male who is a primary patient of [name], DO.  The CCM team was consulted for assistance with disease management and care coordination needs.    Engaged with patient and wife by telephone for follow up visit for pharmacy case management and/or care coordination services.   Objective:  Medications     Reviewed by Rennis Petty, RPH-CPP (Pharmacist) on 04/15/22 at 29  Med List Status: <None>   Medication Order Taking? Sig Documenting Provider Last Dose Status Informant  acetaminophen (TYLENOL) 500 MG tablet [id number]  Take 1,000 mg by mouth every 6 (six) hours as needed for moderate pain or headache.  [provider]  Active Spouse/Significant Other  Alcohol Swabs (B-D SINGLE USE SWABS REGULAR) PADS [id number]  Use to check blood sugar up to 2 times daily [name], DO  Active Spouse/Significant Other  allopurinol (ZYLOPRIM) 100 MG tablet [id number]  Take 1 tablet (100 mg total) by mouth daily. Theora Gianotti, NP  Active   aspirin 81 MG chewable tablet [id number]  Chew 81 mg by mouth daily. [provider]  Active Spouse/Significant Other  Blood Glucose Calibration (OT ULTRA/FASTTK CNTRL SOLN) SOLN [id number]   [provider]  Active   Blood Glucose Monitoring Suppl (ONE TOUCH ULTRA 2) w/Device KIT [id number]  SMARTSIG:Via Meter [provider]  Active   bosutinib (BOSULIF) 100 MG tablet [id number]  Take 100 mg by mouth at bedtime. Take with food. [provider]  Active Spouse/Significant Other           Med Note Jeanie Cooks Sep 15, 2019  8:38 AM)    Cholecalciferol 25 MCG (1000 UT) capsule [id number]  Take 1,000 Units by mouth daily. [provider]  Active Spouse/Significant Other  famotidine (PEPCID) 40 MG  tablet [id number]  Take 1 tablet (40 mg total) by mouth at bedtime. Karamalegos, [name], DO  Active   gabapentin (NEURONTIN) 600 MG tablet [id number]  Take 1 tablet (600 mg total) by mouth 2 (two) times daily. [name], DO  Active   glucose blood [facility] VERIO) test strip [id number]  USE TO TEST BLOOD SUGAR EVERY MORNING AND EVERY NIGHT AT BEDTIME [name], DO  Active   Insulin Glargine ([name] KWIKPEN) 100 UNIT/ML [id number] Yes Inject 22 Units into the skin daily. [name], DO Taking Active   Insulin Pen Needle 31G X 5 MM MISC [id number]  Use to inject insulin daily. Karamalegos, [name], DO  Active   isosorbide mononitrate (IMDUR) 60 MG 24 hr tablet [id number]  Take 0.5 tablets (30 mg total) by mouth daily. [name], MD  Active   JARDIANCE 10 MG TABS tablet [id number] Yes TAKE 1 TABLET(10 MG) BY MOUTH DAILY WITH BREAKFAST [place], [name], DO Taking Active   Lancets (ONETOUCH DELICA PLUS [id number]) Roland [id number]  Use to check blood sugar twice daily [name], DO  Active   levothyroxine (SYNTHROID) 25 MCG tablet [id number]  TAKE 1 TABLET BY MOUTH EVERY MORNING BEFORE BREAKFAST Karamalegos, Alexander Lenna Sciara, DO  Active   losartan (COZAAR) 25 MG tablet [id number]  TAKE 1/2 TABLET(12.5 MG) BY MOUTH DAILY End, [name], MD  Active   Menthol-Zinc Oxide (GOLD BOND EX) [id number]  Apply 1 application topically daily as needed (muscle pain). [provider]  Active Spouse/Significant Other  metolazone (ZAROXOLYN) 2.5 MG tablet [id number]  Take 1 tablet (2.5 mg total) by mouth once a week. Every Wednesday. Take this medication with potassium Bensimhon, Shaune Pascal, MD  Active   mirabegron ER (MYRBETRIQ) 25 MG TB24 tablet [id number]  Take 1 tablet (25 mg total) by mouth daily. Debroah Loop, PA-C  Active   Multiple Vitamin (MULTIVITAMIN WITH MINERALS) TABS tablet [id number]  Take 1 tablet by mouth daily. [provider]  Active Spouse/Significant Other  nitroGLYCERIN (NITROSTAT) 0.4 MG SL tablet [id number]  For chest pain, tightness, or pressure. While sitting, place 1 tablet under tongue. May be used every 5 minutes as needed, for up to 15 minutes. Do not use more than 3 tablets. End, [name], MD  Active   Polyethyl Glycol-Propyl Glycol [facility]) [id number]  Place 1 drop into both eyes 3 (three) times daily as needed (dry/irritated eyes.).  [provider]  Active Spouse/Significant Other  polyethylene glycol (MIRALAX / GLYCOLAX) 17 g packet [id number]  Take 17 g by mouth daily as needed for mild constipation.  [provider]  Active Spouse/Significant Other  potassium chloride SA (KLOR-CON M) 20 MEQ tablet [id number]  Take 2 tablets (40 mEq total) by mouth once a week. Take with metolazone every Wednesday Bensimhon, Shaune Pascal, MD  Active   ranolazine (RANEXA) 500 MG 12 hr tablet [id number]  Take 1 tablet (500 mg total) by mouth 2 (two) times daily. End, [name], MD  Active   rosuvastatin (CRESTOR) 5 MG tablet [id number]  Take 1 tablet (5 mg total) by mouth daily. End, [name], MD  Active   tamsulosin (FLOMAX) 0.4 MG CAPS capsule [id number]  Take 1 capsule (0.4 mg total) by mouth daily after supper. Rise Mu, PA-C  Active Spouse/Significant Other  torsemide (DEMADEX) 20 MG tablet [id number]  Take 3 tablets (60 mg total) by mouth 2 (two) times daily. Bensimhon, Shaune Pascal, MD  Active   traMADol (ULTRAM) 50 MG tablet [id number]  Take 1-2 tablets (50-100 mg total) by mouth at bedtime as needed. [name], DO  Active Spouse/Significant Other  warfarin (COUMADIN) 3 MG tablet [id number]  TAKE 1 TO 1 & 1/2 TABLETS BY MOUTH AS DIRECTED BY THE COUMADIN CLINIC Strength: 3 mg End, [name], MD  Active             Pertinent Labs:  Lab Results  Component Value Date   HGBA1C 8.0 (H) 12/05/2021   Lab Results  Component Value Date   CHOL 156 12/05/2021   HDL 33  (L) 12/05/2021   LDLCALC 92 12/05/2021   TRIG 211 (H) 12/05/2021   CHOLHDL 4.7 12/05/2021   Lab Results  Component Value Date   CREATININE 1.67 (H) 03/25/2022   BUN 38 (H) 03/25/2022   NA 138 03/25/2022   K 3.9 03/25/2022   CL 103 03/25/2022   CO2 26 03/25/2022    SDOH:  (Social Determinants of Health) assessments and interventions performed:  SDOH Interventions    Flowsheet Row Chronic Care Management from 01/28/2022 in [place] Coordination from 11/05/2021 in [place] Coordination Office Visit from 10/05/2021 in [facility] Clinical Support from 09/03/2021 in [place] Management from 10/02/2020 in [facility] Office Visit from 09/08/2019 in [facility]  SDOH Interventions        Food Insecurity Interventions -- Intervention Not  Indicated -- Intervention Not Indicated -- --  Housing Interventions -- Intervention Not Indicated -- Intervention Not Indicated -- --  Transportation Interventions -- Intervention Not Indicated -- Intervention Not Indicated -- --  Utilities Interventions Intervention Not Indicated -- -- -- -- --  Depression Interventions/Treatment  -- -- Counseling -- -- Counseling  Financial Strain Interventions -- Intervention Not Indicated -- Intervention Not Indicated -- --  Physical Activity Interventions -- Other (Comments)  [gets really short of breath due to chronic conditions] -- -- Other (Comments)  [gets easily short of breath. Limited to the activity he can do] --  Stress Interventions -- -- -- Intervention Not Indicated -- --  Social Connections Interventions -- -- -- Intervention Not Indicated -- --       CCM Care Plan  Review of patient past medical history, allergies, medications, health status, including review of consultants reports, laboratory and other test data,  was performed as part of comprehensive evaluation and provision of chronic care management services.   Care Plan : PharmD - Medication Management/Assistance  Updates made by Rennis Petty, RPH-CPP since 04/15/2022 12:00 AM     Problem: Disease Progression      Long-Range Goal: Disease Progression Prevented or Minimized   Start Date: 04/14/2020  Expected End Date: 07/13/2020  Recent Progress: On track  Priority: High  Note:   Current Barriers:  Chronic Disease Management support, education, and care coordination needs related to type 2 diabetes, CKD, HFrEF s/p CardioMEMS, hyperlipidemia, atrial fibrillation, hypertension, coronary artery disease, CHF, hypothyroidism, and CML Financial Note patient has Partial Extra Help/LIS Subsidy Patient enrolled in [place] patient assistance program for 2024 calendar year Patient denied for patient assistance for Jaridance from [facility] for 2022 calendar year Patient previously enrolled in [place] for [employer] copayment assistance, but eligibility ended as not in use ($0 copayment through Part D plan coverage for Jardiance) Limited vision  Pharmacist Clinical Goal(s):  Over the next 90 days, patient will verbalize ability to afford treatment regimen through collaboration with PharmD and provider.   Interventions: 1:1 collaboration with [name], DO regarding development and update of comprehensive plan of care as evidenced by provider attestation and co-signature Inter-disciplinary care team collaboration (see longitudinal plan of care)  Type 2 Diabetes: Current treatment: [name] 22 units daily Jardiance 10 mg once daily in morning Previous therapies tried: Trulicity (stopped due to concern could be contributing to diarrhea) Reports recent fasting blood sugars readings:      Morning Fasting Blood Sugar  22 - January 169  23 - January 189  24 - January 172  25 - January 145  26 -  January 148  27 - January 161  28 - January 134  29 - January 178  Attributes elevated readings to patient started having popsicles at bedtime some nights and sometimes drinking sweet tea Counseled on importance of having regular well-balanced meals and snacks, while controlling carbohydrate portion sizes Again encourage patient to avoid sugary beverages/snacks Encourage patient/caregiver to review nutrition labels for total carbohydrate content of foods Denies hypoglycemia Have counseled on signs of hypoglycemia and how to manage low blood sugar Exercise: walks with walker/cane ~10 minutes most days of the week Have discussed importance of fall prevention/consistently using walker Counsel to continue to monitor home blood sugar, keep a log of results, bring this record to medical appointments and call office sooner if needed for readings outside of established parameters or symptoms Statin: rosuvastatin 5 mg daily Patient/caregiver to follow  up with [facility] [phone]) when patient in need of further diabetes testing supplies Counsel to continue to monitor home blood sugar, keep log of results, have this record to review during upcoming appointments and to call office sooner if needed for readings outside of established parameters or for new symptoms     Medication Assistance: Collaborated with [facility] CPhT for assistance to patient with [employer] patient assistance program application for 3612 calendar year Note patient APPROVED for re-enrollment in assistance program from 03/18/2022-03/18/2023  Today patient's wife confirmed patient received supply of [name] from assistance program a couple of weeks ago  Patient Goals/Self-Care Activities Over the next 90 days, patient will:  Check blood sugar, blood pressure and fluid status as directed by providers  Take medications, with assistance of wife, as directed Mrs. Palma manages patient's medications using weekly pillbox as  adherence tool Attends scheduled medical appointments        Plan: The care management team will reach out to the patient again over the next 30 days.  [name], PharmD, Para March, CPP Clinical Pharmacist [facility] [phone]

## 2022-04-16 NOTE — Telephone Encounter (Signed)
Requested Prescriptions  Pending Prescriptions Disp Refills   famotidine (PEPCID) 40 MG tablet [Pharmacy Med Name: FAMOTIDINE '40MG'$  TABLETS] 180 tablet 0    Sig: TAKE ONE TABLET BY MOUTH TWICE DAILY TAKE WITH A MEAL     Gastroenterology:  H2 Antagonists Passed - 04/15/2022  1:01 PM      Passed - Valid encounter within last 12 months    Recent Outpatient Visits           1 month ago Influenza [place], DO   4 months ago Annual physical exam   [facility] [place], [name], DO   5 months ago Acute pain of left shoulder   [facility] [place], Erin E, PA-C   6 months ago Controlled type 2 diabetes mellitus with diabetic nephropathy, with long-term current use of insulin [facility])   [place], DO   9 months ago Controlled type 2 diabetes mellitus with diabetic nephropathy, with long-term current use of insulin [facility])   [place], DO       Future Appointments             In 1 month Parks Ranger, [name], DO [facility]

## 2022-04-17 DIAGNOSIS — Z794 Long term (current) use of insulin: Secondary | ICD-10-CM

## 2022-04-17 DIAGNOSIS — I110 Hypertensive heart disease with heart failure: Secondary | ICD-10-CM

## 2022-04-17 DIAGNOSIS — I5040 Unspecified combined systolic (congestive) and diastolic (congestive) heart failure: Secondary | ICD-10-CM | POA: Diagnosis not present

## 2022-04-17 DIAGNOSIS — E1159 Type 2 diabetes mellitus with other circulatory complications: Secondary | ICD-10-CM

## 2022-04-26 ENCOUNTER — Other Ambulatory Visit: Payer: Self-pay | Admitting: Internal Medicine

## 2022-04-29 ENCOUNTER — Ambulatory Visit (INDEPENDENT_AMBULATORY_CARE_PROVIDER_SITE_OTHER): Payer: Medicare HMO | Admitting: Family Medicine

## 2022-04-29 ENCOUNTER — Encounter: Payer: Self-pay | Admitting: Family Medicine

## 2022-04-29 VITALS — BP 134/88 | HR 93 | Ht 65.0 in | Wt 201.8 lb

## 2022-04-29 DIAGNOSIS — E1121 Type 2 diabetes mellitus with diabetic nephropathy: Secondary | ICD-10-CM

## 2022-04-29 DIAGNOSIS — Z794 Long term (current) use of insulin: Secondary | ICD-10-CM | POA: Diagnosis not present

## 2022-04-29 NOTE — Progress Notes (Signed)
Subjective:    Patient ID: Lucas Caldwell, male    DOB: March 25, 1933, 89 y.o.   MRN: [id number]  Lucas Caldwell is a 89 y.o. male presenting on 04/29/2022 for Diabetes   HPI   CHRONIC DM, Type 2 / CKD-III Nephropathy and Neuropathy Dizziness / Imbalance   Last A1c 8.0 (10/2021)  He has not had any hypoglycemia   [name] testing blood sugar twice per day   He admits previously on Gabapentin 650m in AM and PM. However says also he has another 3037mGabapentin capsule that he will take PRN at night in addition. [name] followed by Neurology for his neuropathy and imbalance/dizziness.     If wakes up from pain in feet he can take Tylenol, with some relief.   [facility] sugar readings reviewed from 1/22 through 1/29 with avg readings 160s, with low 134, and high 189  He has been eating chocolate covered popsicle in evening, and believes this may be raising sugar in morning. Soda pepsi   On basaglar 22 units  On Jardiance 101mRecent glucose reading 390 in PM  Previously sugar readings were controlled. < [id number]    Off trulicity due to GI side effects diarrhea On med management for DM, improved sugars. Has diabetic shoes On Tramadol PRN Limited benefit from neuropathy.        11/05/2021    9:24 AM 10/05/2021    3:12 PM 09/03/2021    3:46 PM  Depression screen PHQ 2/9  Decreased Interest 0 0 0  Down, Depressed, Hopeless 0 0 0  PHQ - 2 Score 0 0 0  Altered sleeping  0   Tired, decreased energy  1   Change in appetite  1   Feeling bad or failure about yourself   0   Trouble concentrating  0   Moving slowly or fidgety/restless  0   Suicidal thoughts  0   PHQ-9 Score  2   Difficult doing work/chores  Not difficult at all     Social History   Tobacco Use   Smoking status: Former    Packs/day: 1.75    Years: 20.00    Total pack years: 35.00    Types: Cigarettes    Start date: 19525 Quit date: 1975    Years since  quitting: 49.1   Smokeless tobacco: Former  VapScientific laboratory techniciane: Never used  Substance Use Topics   Alcohol use: No    Comment: previously drank heavily - quit 1979.   Drug use: No    Review of Systems Per HPI unless specifically indicated above     Objective:    BP 134/88   Pulse 93   Ht 5' 5"$  (1.651 m)   Wt 201 lb 12.8 oz (91.5 kg)   SpO2 100%   BMI 33.58 kg/m   Wt Readings from Last 3 Encounters:  04/29/22 201 lb 12.8 oz (91.5 kg)  03/25/22 199 lb (90.3 kg)  02/25/22 196 lb (88.9 kg)    Physical Exam Vitals and nursing note reviewed.  Constitutional:      General: [name] not in acute distress.    Appearance: Normal appearance. [name] well-developed. [name] not diaphoretic.     Comments: Well-appearing, comfortable, cooperative  HENT:     Head: Normocephalic and atraumatic.  Eyes:     General:        Right eye:  No discharge.        Left eye: No discharge.     Conjunctiva/sclera: Conjunctivae normal.  Cardiovascular:     Rate and Rhythm: Normal rate.  Pulmonary:     Effort: Pulmonary effort is normal.  Skin:    General: Skin is warm and dry.     Findings: No erythema or rash.  Neurological:     Mental Status: [name] alert and oriented to person, place, and time.  Psychiatric:        Mood and Affect: Mood normal.        Behavior: Behavior normal.        Thought Content: Thought content normal.     Comments: Well groomed, good eye contact, normal speech and thoughts    Results for orders placed or performed in visit on 03/28/22  HM DIABETES EYE EXAM  Result Value Ref Range   HM Diabetic Eye Exam No Retinopathy No Retinopathy   *Note: Due to a large number of results and/or encounters for the requested time period, some results have not been displayed. A complete set of results can be found in Results Review.      Assessment & Plan:   Problem List Items Addressed This Visit     Controlled type 2 diabetes mellitus with diabetic nephropathy, with  long-term current use of insulin ([name]) - Primary    No orders of the defined types were placed in this encounter.  Already scheduled for A1c lab at next visit in March, deferred repeat today.  Home readings reviewed from end of January Today elevation reading 390 Elevated AM fasting sugar  Goal to limit sugary drinks and snacks  Attributed high sugar in AM due to desserts and soda and sweet tea in PM  Recommend Sugar Free Soda Sugar Free Jello options  No change in medication today  In the future after you talk again with Lucas Caldwell, if still higher sugars, I would suggest to increase Jardiance dose from 10 to 22m daily, we can discuss if needed. Or can increase insulin regimen.  Tremors Caution with tremors, cannot take Primidone due to warfarin interaction. Less likely to try Cogentin due to anti cholinergic side effects   Follow up plan: Return if symptoms worsen or fail to improve, for keep apt upcoming March.   ANobie Caldwell [facility] 04/29/2022, 3:04 PM

## 2022-04-29 NOTE — Patient Instructions (Addendum)
Thank you for coming to the office today.  Goal to limit sugary drinks and snacks Now higher Sugar reading in AM, seems to be due to Pepsi and Popsicles  Recommend Sugar Free Soda Sugar Free Jello options  No change in medication today  In the future after you talk again with [name], if still higher sugars, I would suggest to increase Jardiance dose from 10 to 39m daily, we can discuss if needed. Or can increase insulin regimen.  Keep apt with her and myself  Tremors likely can be related to nerve and muscle symptoms. Unfortunately most of the medications that can treat the tremors can have side effects or drug interactions.   Please schedule a Follow-up Appointment to: Return if symptoms worsen or fail to improve, for keep apt upcoming March.  If you have any other questions or concerns, please feel free to call the office or send a message through [facility] You may also schedule an earlier appointment if necessary.  Additionally, you may be receiving a survey about your experience at our office within a few days to 1 week by e-mail or mail. We value your feedback.  ANobie Putnam DO [place]

## 2022-04-30 DIAGNOSIS — H353221 Exudative age-related macular degeneration, left eye, with active choroidal neovascularization: Secondary | ICD-10-CM | POA: Diagnosis not present

## 2022-05-01 ENCOUNTER — Ambulatory Visit: Payer: Medicare HMO | Attending: Internal Medicine

## 2022-05-13 ENCOUNTER — Other Ambulatory Visit: Payer: Self-pay | Admitting: *Deleted

## 2022-05-13 ENCOUNTER — Ambulatory Visit (INDEPENDENT_AMBULATORY_CARE_PROVIDER_SITE_OTHER): Payer: Medicare HMO | Admitting: Pharmacist

## 2022-05-13 DIAGNOSIS — E1121 Type 2 diabetes mellitus with diabetic nephropathy: Secondary | ICD-10-CM

## 2022-05-13 MED ORDER — WARFARIN SODIUM 3 MG PO TABS: ORAL_TABLET | ORAL | 0 refills | Status: DC

## 2022-05-13 NOTE — Chronic Care Management (AMB) (Signed)
Chronic Care Management CCM Pharmacy Note  05/13/2022 Name:  Lucas Caldwell MRN:  [id number] DOB:  1933/11/17   Subjective: Lucas Caldwell is an 89 y.o. year old male who is a primary patient of [name], DO.  The CCM team was consulted for assistance with disease management and care coordination needs.    Engaged with patient and wife by telephone for follow up visit for pharmacy case management and/or care coordination services.   Objective:  Medications    Reviewed by [name], DO (Physician) on 04/29/22 at [place] List Status: <None>   Medication Order Taking? Sig Documenting Provider Last Dose Status Informant  acetaminophen (TYLENOL) 500 MG tablet [id number] Yes Take 1,000 mg by mouth every 6 (six) hours as needed for moderate pain or headache.  [provider] Taking Active Spouse/Significant Other  Alcohol Swabs (B-D SINGLE USE SWABS REGULAR) PADS [id number] Yes Use to check blood sugar up to 2 times daily [name], DO Taking Active Spouse/Significant Other  allopurinol (ZYLOPRIM) 100 MG tablet [id number] Yes Take 1 tablet (100 mg total) by mouth daily. Theora Gianotti, NP Taking Active   aspirin 81 MG chewable tablet [id number] Yes Chew 81 mg by mouth daily. [provider] Taking Active Spouse/Significant Other  Blood Glucose Calibration (OT ULTRA/FASTTK CNTRL SOLN) Bailey Mech [id number] Yes  [provider] Taking Active   Blood Glucose Monitoring Suppl (ONE TOUCH ULTRA 2) w/Device KIT [id number] Yes SMARTSIG:Via Meter [provider] Taking Active   bosutinib (BOSULIF) 100 MG tablet [id number] Yes Take 100 mg by mouth at bedtime. Take with food. [provider] Taking Active Spouse/Significant Other           Med Note Jeanie Cooks Sep 15, 2019  8:38 AM)    Cholecalciferol 25 MCG (1000 UT) capsule [id number] Yes Take 1,000 Units by mouth daily. [provider]  Taking Active Spouse/Significant Other  famotidine (PEPCID) 40 MG tablet [id number] Yes TAKE ONE TABLET BY MOUTH TWICE DAILY TAKE WITH A MEAL Karamalegos, [name], DO Taking Active   gabapentin (NEURONTIN) 600 MG tablet [id number] Yes Take 1 tablet (600 mg total) by mouth 2 (two) times daily. [name], DO Taking Active   glucose blood [facility] VERIO) test strip [id number] Yes USE TO TEST BLOOD SUGAR EVERY MORNING AND EVERY NIGHT AT BEDTIME [name], DO Taking Active   Insulin Glargine ([name] KWIKPEN) 100 UNIT/ML [id number] Yes Inject 22 Units into the skin daily. [name], DO Taking Active   Insulin Pen Needle 31G X 5 MM MISC [id number] Yes Use to inject insulin daily. [name], DO Taking Active   isosorbide mononitrate (IMDUR) 60 MG 24 hr tablet [id number] Yes Take 0.5 tablets (30 mg total) by mouth daily. [name], MD Taking Active   JARDIANCE 10 MG TABS tablet [id number] Yes TAKE 1 TABLET(10 MG) BY MOUTH DAILY WITH BREAKFAST [place], [name], DO Taking Active   Lancets (ONETOUCH DELICA PLUS Q000111Q) [place] [id number] Yes Use to check blood sugar twice daily [name], DO Taking Active   levothyroxine (SYNTHROID) 25 MCG tablet [id number] Yes TAKE 1 TABLET BY MOUTH EVERY MORNING BEFORE BREAKFAST Karamalegos, [name], DO Taking Active   losartan (COZAAR) 25 MG tablet [id number] Yes TAKE 1/2 TABLET(12.5 MG) BY MOUTH DAILY End, [name], MD Taking Active   Menthol-Zinc Oxide (GOLD [place]) Q000111Q Yes Apply 1 application topically daily as needed (muscle pain). [provider]  Taking Active Spouse/Significant Other  metolazone (ZAROXOLYN) 2.5 MG tablet [id number] Yes Take 1 tablet (2.5 mg total) by mouth once a week. Every Wednesday. Take this medication with potassium Bensimhon, Shaune Pascal, MD Taking Active   mirabegron ER (MYRBETRIQ) 25 MG TB24 tablet [id number] Yes Take 1 tablet (25 mg total) by  mouth daily. Debroah Loop, PA-C Taking Active   Multiple Vitamin (MULTIVITAMIN WITH MINERALS) TABS tablet [id number] Yes Take 1 tablet by mouth daily. [provider] Taking Active Spouse/Significant Other  nitroGLYCERIN (NITROSTAT) 0.4 MG SL tablet [id number] Yes For chest pain, tightness, or pressure. While sitting, place 1 tablet under tongue. May be used every 5 minutes as needed, for up to 15 minutes. Do not use more than 3 tablets. [name], MD Taking Active   Polyethyl Glycol-Propyl Glycol [facility]) [id number] Yes Place 1 drop into both eyes 3 (three) times daily as needed (dry/irritated eyes.).  [provider] Taking Active Spouse/Significant Other  polyethylene glycol (MIRALAX / GLYCOLAX) 17 g packet [id number] Yes Take 17 g by mouth daily as needed for mild constipation.  [provider] Taking Active Spouse/Significant Other  potassium chloride SA (KLOR-CON M) 20 MEQ tablet [id number] Yes Take 2 tablets (40 mEq total) by mouth once a week. Take with metolazone every Wednesday Bensimhon, Shaune Pascal, MD Taking Active   ranolazine (RANEXA) 500 MG 12 hr tablet [id number] Yes Take 1 tablet (500 mg total) by mouth 2 (two) times daily. End, [name], MD Taking Active   rosuvastatin (CRESTOR) 5 MG tablet [id number] Yes TAKE 1 TABLET(5 MG) BY MOUTH DAILY End, [name], MD Taking Active   tamsulosin (FLOMAX) 0.4 MG CAPS capsule [id number] Yes Take 1 capsule (0.4 mg total) by mouth daily after supper. Rise Mu, PA-C Taking Active Spouse/Significant Other  torsemide (DEMADEX) 20 MG tablet [id number] Yes Take 3 tablets (60 mg total) by mouth 2 (two) times daily. Bensimhon, Shaune Pascal, MD Taking Active   traMADol (ULTRAM) 50 MG tablet [id number] Yes Take 1-2 tablets (50-100 mg total) by mouth at bedtime as needed. [name], DO Taking Active Spouse/Significant Other  warfarin (COUMADIN) 3 MG tablet [id number] Yes TAKE 1 TO 1 & 1/2 TABLETS BY MOUTH AS  DIRECTED BY THE COUMADIN CLINIC Strength: 3 mg End, [name], MD Taking Active             Pertinent Labs:   Lab Results  Component Value Date   HGBA1C 8.0 (H) 12/05/2021   Lab Results  Component Value Date   CHOL 156 12/05/2021   HDL 33 (L) 12/05/2021   LDLCALC 92 12/05/2021   TRIG 211 (H) 12/05/2021   CHOLHDL 4.7 12/05/2021   Lab Results  Component Value Date   CREATININE 1.67 (H) 03/25/2022   BUN 38 (H) 03/25/2022   NA 138 03/25/2022   K 3.9 03/25/2022   CL 103 03/25/2022   CO2 26 03/25/2022    SDOH:  (Social Determinants of Health) assessments and interventions performed:  SDOH Interventions    Flowsheet Row Chronic Care Management from 01/28/2022 in [place] Coordination from 11/05/2021 in [place] Coordination Office Visit from 10/05/2021 in [facility] Clinical Support from 09/03/2021 in [place] Management from 10/02/2020 in [facility] Office Visit from 09/08/2019 in [facility]  SDOH Interventions        Food Insecurity Interventions -- Intervention Not  Indicated -- Intervention Not Indicated -- --  Housing Interventions -- Intervention Not Indicated -- Intervention Not Indicated -- --  Transportation Interventions -- Intervention Not Indicated -- Intervention Not Indicated -- --  Utilities Interventions Intervention Not Indicated -- -- -- -- --  Depression Interventions/Treatment  -- -- Counseling -- -- Counseling  Financial Strain Interventions -- Intervention Not Indicated -- Intervention Not Indicated -- --  Physical Activity Interventions -- Other (Comments)  [gets really short of breath due to chronic conditions] -- -- Other (Comments)  [gets easily short of breath. Limited to the activity he can do] --  Stress Interventions -- -- -- Intervention Not Indicated -- --   Social Connections Interventions -- -- -- Intervention Not Indicated -- --       CCM Care Plan  Review of patient past medical history, allergies, medications, health status, including review of consultants reports, laboratory and other test data, was performed as part of comprehensive evaluation and provision of chronic care management services.   Care Plan : PharmD - Medication Management/Assistance  Updates made by Rennis Petty, RPH-CPP since 05/13/2022 12:00 AM     Problem: Disease Progression      Long-Range Goal: Disease Progression Prevented or Minimized   Start Date: 04/14/2020  Expected End Date: 07/13/2020  Recent Progress: On track  Priority: High  Note:   Current Barriers:  Chronic Disease Management support, education, and care coordination needs related to type 2 diabetes, CKD, HFrEF s/p CardioMEMS, hyperlipidemia, atrial fibrillation, hypertension, coronary artery disease, CHF, hypothyroidism, and CML Financial Note patient has Partial Extra Help/LIS Subsidy Patient enrolled in [place] patient assistance program for 2024 calendar year Patient denied for patient assistance for Jaridance from [facility] for 2022 calendar year Patient previously enrolled in [place] for [employer] copayment assistance, but eligibility ended as not in use ($0 copayment through Part D plan coverage for Jardiance) Limited vision  Pharmacist Clinical Goal(s):  Over the next 90 days, patient will verbalize ability to afford treatment regimen through collaboration with PharmD and provider.   Interventions: 1:1 collaboration with [name], DO regarding development and update of comprehensive plan of care as evidenced by provider attestation and co-signature Inter-disciplinary care team collaboration (see longitudinal plan of care) Received message from PCP on 2/12. Patient seen for Office Visit and let provider know he had a recent blood  sugar reading of 390 in PM. Provider noted attributed to eating chocolate covered popsicle in evening and Pepsi during the day From review of chart, note patient missed appointment with Cardiology Coumadin clinic on 2/14. Spouse confirms has phone number for clinic and states will contact Cardiology today to reschedule this appointment  Type 2 Diabetes: Current treatment: [name] 22 units daily Jardiance 10 mg once daily in morning Previous therapies tried: Trulicity (stopped due to concern could be contributing to diarrhea) Reports blood sugar readings have improved, recently ranging 118-175 since again stopped drinking Pepsi and having popsicles for dessert Reports recent fasting blood sugars readings:      Morning Fasting Blood Sugar  21 - February 140  22 - February 150  23 - February 130  24 - February 131  25 - February 155  26 - February 175*   Attributes elevated reading last night to patient started having ice cream last night Counseled on importance of having regular well-balanced meals and snacks, while controlling carbohydrate portion sizes Again encourage patient to avoid sugary beverages/snacks Encourage patient/caregiver to review nutrition labels for total carbohydrate  content of foods Denies hypoglycemia Have counseled on signs of hypoglycemia and how to manage low blood sugar Exercise: walks with walker/cane ~10 minutes most days of the week Have discussed importance of fall prevention/consistently using walker Counsel to continue to monitor home blood sugar, keep a log of results, bring this record to medical appointments and call office sooner if needed for readings outside of established parameters or symptoms Statin: rosuvastatin 5 mg daily Patient/caregiver to follow up with [facility] [phone]) when patient in need of further diabetes testing supplies Counsel to continue to monitor home blood sugar, keep log of results, have this record to  review during upcoming appointments and to call office sooner if needed for readings outside of established parameters or for new symptoms     Patient Goals/Self-Care Activities Over the next 90 days, patient will:  Check blood sugar, blood pressure and fluid status as directed by providers  Take medications, with assistance of wife, as directed Mrs. Jurich manages patient's medications using weekly pillbox as adherence tool Attends scheduled medical appointments        Plan: Telephone follow up appointment with care management team member scheduled for: 07/08/2022 at 45 am  [name], PharmD, [place], [facility] [phone]

## 2022-05-13 NOTE — Patient Instructions (Signed)
Visit Information  Thank you for taking time to visit with me today. Please don't hesitate to contact me if I can be of assistance to you before our next scheduled telephone appointment.  Following are the goals we discussed today:   Goals Addressed             This Visit's Progress    Pharmacy Goals       Please check your blood sugar, keep a log of the results and bring this with you to your medical appointments.  Please check your home blood pressure, keep a log of the results and bring this with you to your medical appointments.  Our goal bad cholesterol, or LDL, is less than 70 . This is why it is important to continue taking your rosuvastatin.  Feel free to call me with any questions or concerns. I look forward to our next call!  [name], PharmD, [place], CPP Clinical Pharmacist [facility] [phone]          Our next appointment is by telephone on 07/08/2022 at 10:00 AM   Please call the care guide team at [phone] if you need to cancel or reschedule your appointment.    Patient verbalizes understanding of instructions and care plan provided today and agrees to view in [place]. Active MyChart status and patient understanding of how to access instructions and care plan via MyChart confirmed with patient.

## 2022-05-13 NOTE — Telephone Encounter (Signed)
Prescription refill request received for warfarin Lov: Bensimhon 03/25/2022 Next INR check: 05/01/2022 Warfarin tablet strength: '3mg'$    Pt is scheduled to go 3/6 to have INR checked.

## 2022-05-16 DIAGNOSIS — Z794 Long term (current) use of insulin: Secondary | ICD-10-CM

## 2022-05-16 DIAGNOSIS — E1121 Type 2 diabetes mellitus with diabetic nephropathy: Secondary | ICD-10-CM

## 2022-05-22 ENCOUNTER — Ambulatory Visit: Payer: Medicare HMO | Attending: Internal Medicine

## 2022-05-22 DIAGNOSIS — B351 Tinea unguium: Secondary | ICD-10-CM | POA: Diagnosis not present

## 2022-05-22 DIAGNOSIS — I480 Paroxysmal atrial fibrillation: Secondary | ICD-10-CM

## 2022-05-22 DIAGNOSIS — Z7901 Long term (current) use of anticoagulants: Secondary | ICD-10-CM | POA: Diagnosis not present

## 2022-05-22 DIAGNOSIS — M79675 Pain in left toe(s): Secondary | ICD-10-CM | POA: Diagnosis not present

## 2022-05-22 DIAGNOSIS — M79674 Pain in right toe(s): Secondary | ICD-10-CM | POA: Diagnosis not present

## 2022-05-22 LAB — POCT INR: INR: 2.7 (ref 2.0–3.0)

## 2022-05-22 NOTE — Patient Instructions (Signed)
Description   Continue dosage of warfarin of 1.5 TABLETS EVERY DAY EXCEPT 1 TABLET ON MONDAYS & FRIDAYS.  Recheck INR in 6 weeks

## 2022-05-27 ENCOUNTER — Other Ambulatory Visit: Payer: Self-pay | Admitting: Family Medicine

## 2022-05-28 NOTE — Telephone Encounter (Signed)
Requested Prescriptions  Pending Prescriptions Disp Refills   gabapentin (NEURONTIN) 600 MG tablet [Pharmacy Med Name: GABAPENTIN '600MG'$  TABLETS] 180 tablet 1    Sig: TAKE 1 TABLET(600 MG) BY MOUTH TWICE DAILY     Neurology: Anticonvulsants - gabapentin Failed - 05/27/2022  2:35 PM      Failed - Cr in normal range and within 360 days    Creat  Date Value Ref Range Status  12/05/2021 1.96 (H) 0.70 - 1.22 mg/dL Final   Creatinine, Ser  Date Value Ref Range Status  03/25/2022 1.67 (H) 0.61 - 1.24 mg/dL Final   Creatinine, Urine  Date Value Ref Range Status  12/18/2016 96 mg/dL Final         Passed - Completed PHQ-2 or PHQ-9 in the last 360 days      Passed - Valid encounter within last 12 months    Recent Outpatient Visits           4 weeks ago Controlled type 2 diabetes mellitus with diabetic nephropathy, with long-term current use of insulin [facility])   [place], Alexander J, DO   3 months ago Influenza [place], DO   5 months ago Annual physical exam   [facility], [name], DO   6 months ago Acute pain of left shoulder   [place], PA-C   7 months ago Controlled type 2 diabetes mellitus with diabetic nephropathy, with long-term current use of insulin [facility])   [place], DO       Future Appointments             In 1 week [place], [name], DO [facility], [facility]

## 2022-06-01 ENCOUNTER — Other Ambulatory Visit (HOSPITAL_COMMUNITY): Payer: Self-pay | Admitting: Family Medicine

## 2022-06-05 ENCOUNTER — Encounter: Payer: Self-pay | Admitting: Family Medicine

## 2022-06-05 ENCOUNTER — Telehealth (HOSPITAL_COMMUNITY): Payer: Self-pay | Admitting: Adult Health

## 2022-06-05 ENCOUNTER — Ambulatory Visit (INDEPENDENT_AMBULATORY_CARE_PROVIDER_SITE_OTHER): Payer: Medicare HMO | Admitting: Family Medicine

## 2022-06-05 ENCOUNTER — Other Ambulatory Visit (INDEPENDENT_AMBULATORY_CARE_PROVIDER_SITE_OTHER): Payer: Self-pay | Admitting: Podiatry

## 2022-06-05 VITALS — BP 134/86 | HR 86 | Ht 65.0 in | Wt 201.8 lb

## 2022-06-05 DIAGNOSIS — I700 Atherosclerosis of aorta: Secondary | ICD-10-CM | POA: Diagnosis not present

## 2022-06-05 DIAGNOSIS — Z794 Long term (current) use of insulin: Secondary | ICD-10-CM | POA: Diagnosis not present

## 2022-06-05 DIAGNOSIS — I739 Peripheral vascular disease, unspecified: Secondary | ICD-10-CM

## 2022-06-05 DIAGNOSIS — E1121 Type 2 diabetes mellitus with diabetic nephropathy: Secondary | ICD-10-CM

## 2022-06-05 DIAGNOSIS — S98111A Complete traumatic amputation of right great toe, initial encounter: Secondary | ICD-10-CM

## 2022-06-05 DIAGNOSIS — E1142 Type 2 diabetes mellitus with diabetic polyneuropathy: Secondary | ICD-10-CM | POA: Diagnosis not present

## 2022-06-05 DIAGNOSIS — I5022 Chronic systolic (congestive) heart failure: Secondary | ICD-10-CM | POA: Diagnosis not present

## 2022-06-05 LAB — POCT GLYCOSYLATED HEMOGLOBIN (HGB A1C): Hemoglobin A1C: 7.5 % — AB (ref 4.0–5.6)

## 2022-06-05 NOTE — Progress Notes (Addendum)
Subjective:    Patient ID: Lucas Caldwell, male    DOB: 1933/04/13, 89 y.o.   MRN: [id number]  Lucas Caldwell is a 89 y.o. male presenting on 06/05/2022 for Diabetes   HPI   CHRONIC DM, Type 2 / CKD-III Nephropathy and Neuropathy Dizziness / Imbalance   A1c down to 7.5 today, prior 8.0 He has not had any hypoglycemia   [name] testing blood sugar twice per day   He admits previously on Gabapentin 600mg  in AM and PM. However says also he has another 300mg  Gabapentin capsule that he will take PRN at night in addition. [name] followed by Neurology for his neuropathy and imbalance/dizziness.     If wakes up from pain in feet he can take Tylenol, with some relief.  Reduced soda and ice cream snacks now, doing better with diet Continues Jardiance 10mg , has done well on this dose.  Also followed by Dr Gala Romney cardiology CHF clinic  Renal dosing Gabapentin 600mg  TWICE A DAY, now with improved renal function asking about taking a 3rd pill if needed only.    Improved CBG readings.    Off trulicity due to GI side effects diarrhea Has diabetic shoes needs new order On Tramadol PRN Limited benefit from neuropathy.  Aortic Atherosclerosis Prior imaging with CXR 12/11/21 with Aortic Atherosclerosis on imaging. He has known cardiovascular risk.      11/05/2021    9:24 AM 10/05/2021    3:12 PM 09/03/2021    3:46 PM  Depression screen PHQ 2/9  Decreased Interest 0 0 0  Down, Depressed, Hopeless 0 0 0  PHQ - 2 Score 0 0 0  Altered sleeping  0   Tired, decreased energy  1   Change in appetite  1   Feeling bad or failure about yourself   0   Trouble concentrating  0   Moving slowly or fidgety/restless  0   Suicidal thoughts  0   PHQ-9 Score  2   Difficult doing work/chores  Not difficult at all     Social History   Tobacco Use   Smoking status: Former    Packs/day: 1.75    Years: 20.00    Additional pack years: 0.00    Total pack years: 35.00    Types: Cigarettes     Start date: 53    Quit date: 1975    Years since quitting: 86.2   Smokeless tobacco: Former  Building services engineer Use: Never used  Substance Use Topics   Alcohol use: No    Comment: previously drank heavily - quit 1979.   Drug use: No    Review of Systems Per HPI unless specifically indicated above     Objective:    BP 134/86   Pulse 86   Ht 5\' 5"  (1.651 m)   Wt 201 lb 12.8 oz (91.5 kg)   SpO2 99%   BMI 33.58 kg/m   Wt Readings from Last 3 Encounters:  06/05/22 201 lb 12.8 oz (91.5 kg)  04/29/22 201 lb 12.8 oz (91.5 kg)  03/25/22 199 lb (90.3 kg)    Physical Exam Vitals and nursing note reviewed.  Constitutional:      General: [name] not in acute distress.    Appearance: [name] well-developed. [name] not diaphoretic.     Comments: Well-appearing, comfortable, cooperative  HENT:     Head: Normocephalic and atraumatic.  Eyes:     General:  Right eye: No discharge.        Left eye: No discharge.     Conjunctiva/sclera: Conjunctivae normal.  Neck:     Thyroid: No thyromegaly.  Cardiovascular:     Rate and Rhythm: Normal rate and regular rhythm.     Pulses: Normal pulses.     Heart sounds: Normal heart sounds. No murmur heard. Pulmonary:     Effort: Pulmonary effort is normal. No respiratory distress.     Breath sounds: Normal breath sounds. No wheezing or rales.  Musculoskeletal:        General: Normal range of motion.     Cervical back: Normal range of motion and neck supple.     Right lower leg: No edema.     Left lower leg: No edema.  Lymphadenopathy:     Cervical: No cervical adenopathy.  Skin:    General: Skin is warm and dry.     Findings: No erythema or rash.  Neurological:     Mental Status: [name] alert and oriented to person, place, and time. Mental status is at baseline.  Psychiatric:        Behavior: Behavior normal.     Comments: Well groomed, good eye contact, normal speech and thoughts    I have personally reviewed the radiology  report from 12/11/21 CXR.  Narrative & Impression  CLINICAL DATA:  Pt complains of chest pain that started this morning with nausea. Denies any vomiting, denies SOB at this time but states sometimes SOB comes and goes   EXAM: CHEST - 2 VIEW   COMPARISON:  08/16/2020   FINDINGS: Lungs are clear.  Stable left lower lobe CardioMEMS device.   Heart size upper limits normal. Aortic Atherosclerosis (ICD10-170.0).   No effusion.  No pneumothorax.   Bilateral shoulder DJD.   IMPRESSION: No acute cardiopulmonary disease.     Electronically Signed   By: Corlis Leak M.D.   On: 12/11/2021 16:17     Diabetic Foot Exam - Simple   Simple Foot Form Diabetic Foot exam was performed with the following findings: Yes 06/05/2022  3:38 PM  Visual Inspection See comments: Yes Sensation Testing See comments: Yes Pulse Check Posterior Tibialis and Dorsalis pulse intact bilaterally: Yes Comments Right great toe s/p amputation, well healed. Callus formation bilateral forefeet and heel. No ulceration active. Reduced monofilament Left worse than Right. Has some intact dorsal bilateral and posterior R side.       Results for orders placed or performed in visit on 06/05/22  POCT glycosylated hemoglobin (Hb A1C)  Result Value Ref Range   Hemoglobin A1C 7.5 (A) 4.0 - 5.6 %   *Note: Due to a large number of results and/or encounters for the requested time period, some results have not been displayed. A complete set of results can be found in Results Review.      Assessment & Plan:   Problem List Items Addressed This Visit     Amputated great toe, right (HCC)   Chronic HFrEF (heart failure with reduced ejection fraction) (HCC)   Controlled type 2 diabetes mellitus with diabetic nephropathy, with long-term current use of insulin (HCC) - Primary    Controlled A1c - 7.5 lifestyle improving Complications - hypoglycemia (improved), CKD-III, peripheral neuropathy, cataracts, CAD Off Trulicity  side effects GI  CKD improved now to GFR >40  Plan:  1. Discussed DM management concern with CKD and risk of hypoglycemia - limited med options due to CKD - Continue Jardiance 10mg  daily (reduced for  GFR) - Continue reduced [name] 22u - Counseling on risk of hypoglycemia and symptoms 2. Encourage improved lifestyle - keep improving diet  limit excess sweets sugars   Chronic bilateral diabetic neuropathy in both feet, as complication of diabetes Additionally with R toe deformity, s/p amputation of R Great toe Evidence of callus formation both feet, heels and mid foot with neuropathy.  Completed DM Foot Exam today in office, 06/05/22. See exam note.  Plan - Proceed with ordering Diabetic Shoes, will complete form and fax to [facility] + office note - Patient would benefit from Diabetic Shoes due to neuropathy with callus formation and great toe deformity causing pain, diabetes control is improving on current regimen, and I am continuing to monitor and manage diabetes.  Will proceed with authorizing order for diabetic shoes       Relevant Medications   gabapentin (NEURONTIN) 600 MG tablet   Other Relevant Orders   POCT glycosylated hemoglobin (Hb A1C) (Completed)   Diabetic polyneuropathy associated with type 2 diabetes mellitus (HCC)   Relevant Medications   gabapentin (NEURONTIN) 600 MG tablet   Other Visit Diagnoses     Atherosclerosis of aorta (HCC)           Aortic Atherosclerosis Last on CXR 11/2021 On statin therapy  DM Neuropathy Discussion on Gabapentin today Some confusion on his dose 300 vs 600mg . Seems they have the 600mg  at home and taking twice per day. Limited options except re ordering for lower dose 300mg  but then increasing pill count, but this could be limited by quantity amount per insurance.  Advised may take current dose 600mg  twice a day regularly as he is, but may take 1 extra tab in evening some nights if needed.  Or ideally we could  reduce to 300mg  and do 3 times a day regularly. But that is overall reduced dose as well.  -------------------  DM Shoe order when completed to be faxed to:  [facility] [address] [place], [place] [address] Open until 5PM Phone: [phone] Fax: [phone]   No orders of the defined types were placed in this encounter.     Follow up plan: Return in about 4 months (around 10/05/2022) for 4 month follow up DM A1c neuropathy, renal/heart updates.   [name], DO [facility] 06/05/2022, 3:30 PM

## 2022-06-05 NOTE — Patient Instructions (Addendum)
Thank you for coming to the office today.  Recent Labs    07/16/21 1420 12/05/21 1537 06/05/22 1531  HGBA1C 7.8* 8.0* 7.5*   Increase Gabapentin 300mg  x 3 a day max, can do 1 in morning 1 in afternoon and 1 in the evening  OR can do 1 in morning and 2 in the evening.  Max dose is 3 in 24 hours.  Let me know if need more  -------------------  We will order the Diabetic Shoes to [facility]  They will contact you when the shoes are ready it may take a few weeks.  [address] [place], [place] [address] Open [address] [place] Phone: [phone] Fax: [phone]  Please schedule a Follow-up Appointment to: Return in about 4 months (around 10/05/2022) for 4 month follow up DM A1c neuropathy, renal/heart updates.  If you have any other questions or concerns, please feel free to call the office or send a message through [place]. You may also schedule an earlier appointment if necessary.  Additionally, you may be receiving a survey about your experience at our office within a few days to 1 week by e-mail or mail. We value your feedback.  [name], DO [place]

## 2022-06-05 NOTE — Assessment & Plan Note (Addendum)
Controlled A1c - 7.5 lifestyle improving Complications - hypoglycemia (improved), CKD-III, peripheral neuropathy, cataracts, CAD Off Trulicity side effects GI  CKD improved now to GFR >40  Plan:  1. Discussed DM management concern with CKD and risk of hypoglycemia - limited med options due to CKD - Continue Jardiance 10mg  daily (reduced for GFR) - Continue reduced [name] 22u - Counseling on risk of hypoglycemia and symptoms 2. Encourage improved lifestyle - keep improving diet  limit excess sweets sugars   Chronic bilateral diabetic neuropathy in both feet, as complication of diabetes Additionally with R toe deformity, s/p amputation of R Great toe Evidence of callus formation both feet, heels and mid foot with neuropathy.  Completed DM Foot Exam today in office, 06/05/22. See exam note.  Plan - Proceed with ordering Diabetic Shoes, will complete form and fax to [facility] + office note - Patient would benefit from Diabetic Shoes due to neuropathy with callus formation and great toe deformity causing pain, diabetes control is improving on current regimen, and I am continuing to monitor and manage diabetes.  Will proceed with authorizing order for diabetic shoes

## 2022-06-05 NOTE — Telephone Encounter (Signed)
  Cardiomems Remote Monitoring  S/P Cardiomems Implant: 2021   PAD Goal: 19 Most recent reading: 27 suggestive of fluid accumulation   Recommended changes: Please call and advise to take to an extra 20 mg torsemide x 2 days.   I continue to review and analyze the patients PA pressures weekly (and more often as needed) to bring PA pressures within the optimal range.        Tayli Buch NP-C  11:36 AM

## 2022-06-05 NOTE — Telephone Encounter (Signed)
Pt aware via wife 

## 2022-06-07 ENCOUNTER — Telehealth: Payer: Self-pay | Admitting: Pharmacist

## 2022-06-07 NOTE — Progress Notes (Signed)
   Outreach Note  06/07/2022 Name: Lucas Caldwell MRN: [id number] DOB: [birth year]  Referred by: [name], DO Reason for referral : No chief complaint on file.  Receive a message from PCP requesting outreach to patient for clarification regarding dose of gabapentin patient taking at home  Was unable to reach patient via telephone today and unable to leave a message as no voicemail picks up   Follow Up Plan: Will attempt to reach patient by telephone again within the next 7 days  [name], PharmD, [facility] [phone]

## 2022-06-09 DIAGNOSIS — R030 Elevated blood-pressure reading, without diagnosis of hypertension: Secondary | ICD-10-CM | POA: Diagnosis not present

## 2022-06-09 DIAGNOSIS — K08109 Complete loss of teeth, unspecified cause, unspecified class: Secondary | ICD-10-CM | POA: Diagnosis not present

## 2022-06-09 DIAGNOSIS — G4733 Obstructive sleep apnea (adult) (pediatric): Secondary | ICD-10-CM | POA: Diagnosis not present

## 2022-06-09 DIAGNOSIS — Z008 Encounter for other general examination: Secondary | ICD-10-CM | POA: Diagnosis not present

## 2022-06-09 DIAGNOSIS — E669 Obesity, unspecified: Secondary | ICD-10-CM | POA: Diagnosis not present

## 2022-06-09 DIAGNOSIS — E785 Hyperlipidemia, unspecified: Secondary | ICD-10-CM | POA: Diagnosis not present

## 2022-06-09 DIAGNOSIS — K219 Gastro-esophageal reflux disease without esophagitis: Secondary | ICD-10-CM | POA: Diagnosis not present

## 2022-06-09 DIAGNOSIS — N400 Enlarged prostate without lower urinary tract symptoms: Secondary | ICD-10-CM | POA: Diagnosis not present

## 2022-06-09 DIAGNOSIS — I255 Ischemic cardiomyopathy: Secondary | ICD-10-CM | POA: Diagnosis not present

## 2022-06-09 DIAGNOSIS — E039 Hypothyroidism, unspecified: Secondary | ICD-10-CM | POA: Diagnosis not present

## 2022-06-09 DIAGNOSIS — M1990 Unspecified osteoarthritis, unspecified site: Secondary | ICD-10-CM | POA: Diagnosis not present

## 2022-06-09 DIAGNOSIS — R269 Unspecified abnormalities of gait and mobility: Secondary | ICD-10-CM | POA: Diagnosis not present

## 2022-06-09 DIAGNOSIS — N3941 Urge incontinence: Secondary | ICD-10-CM | POA: Diagnosis not present

## 2022-06-10 ENCOUNTER — Telehealth: Payer: Medicare HMO

## 2022-06-10 ENCOUNTER — Other Ambulatory Visit: Payer: Self-pay | Admitting: Nurse Practitioner

## 2022-06-10 ENCOUNTER — Other Ambulatory Visit: Payer: Self-pay | Admitting: Internal Medicine

## 2022-06-10 ENCOUNTER — Ambulatory Visit: Payer: Medicare HMO | Admitting: Pharmacist

## 2022-06-10 ENCOUNTER — Ambulatory Visit (INDEPENDENT_AMBULATORY_CARE_PROVIDER_SITE_OTHER): Payer: Medicare HMO

## 2022-06-10 DIAGNOSIS — H353221 Exudative age-related macular degeneration, left eye, with active choroidal neovascularization: Secondary | ICD-10-CM | POA: Diagnosis not present

## 2022-06-10 DIAGNOSIS — G4733 Obstructive sleep apnea (adult) (pediatric): Secondary | ICD-10-CM

## 2022-06-10 DIAGNOSIS — I5023 Acute on chronic systolic (congestive) heart failure: Secondary | ICD-10-CM

## 2022-06-10 DIAGNOSIS — I5032 Chronic diastolic (congestive) heart failure: Secondary | ICD-10-CM

## 2022-06-10 DIAGNOSIS — I10 Essential (primary) hypertension: Secondary | ICD-10-CM

## 2022-06-10 DIAGNOSIS — N1831 Chronic kidney disease, stage 3a: Secondary | ICD-10-CM

## 2022-06-10 DIAGNOSIS — E1121 Type 2 diabetes mellitus with diabetic nephropathy: Secondary | ICD-10-CM

## 2022-06-10 DIAGNOSIS — I5022 Chronic systolic (congestive) heart failure: Secondary | ICD-10-CM

## 2022-06-10 DIAGNOSIS — I480 Paroxysmal atrial fibrillation: Secondary | ICD-10-CM

## 2022-06-10 NOTE — Patient Instructions (Signed)
Please call the care guide team at [phone] if you need to cancel or reschedule your appointment.   If you are experiencing a Mental Health or [place] or need someone to talk to, please call the Suicide and Crisis Lifeline: 988 call the [place] National Suicide Prevention Lifeline: 986-560-7646 or TTY: 909-326-5868 TTY 531-429-2179) to talk to a trained counselor call 1-800-273-TALK (toll free, 24 hour hotline)   Following is a copy of the CCM Program Consent:  CCM service includes personalized support from designated clinical staff supervised by the physician, including individualized plan of care and coordination with other care providers 24/7 contact phone numbers for assistance for urgent and routine care needs. Service will only be billed when office clinical staff spend 20 minutes or more in a month to coordinate care. Only one practitioner may furnish and bill the service in a calendar month. The patient may stop CCM services at amy time (effective at the end of the month) by phone call to the office staff. The patient will be responsible for cost sharing (co-pay) or up to 20% of the service fee (after annual deductible is met)  Following is a copy of your full provider care plan:   Goals Addressed             This Visit's Progress    CCM (DIABETES) EXPECTED OUTCOME: MONITOR, SELF-MANAGE AND REDUCE SYMPTOMS OF DIABETES       Current Barriers:  Knowledge Deficits related to the importance of blood sugars control in the patient with DM and multiple other chronic conditions Care Coordination needs related to ongoing support and education  in a patient with DM Chronic Disease Management support and education needs related to effective management of DM A1C goal of <8.0% Lab Results  Component Value Date   HGBA1C 7.5 (A) 06/05/2022     Planned Interventions: Provided education to patient about basic DM disease process. Review and education provided. The  patients A1C is trending down. Praised for working hard to get A1C down.; Reviewed medications with patient and discussed importance of medication adherence. The patient is compliant with medications. Is compliant with medications. Has medications and is working with the pharm D on a regular basis for effective management of medications and changes in medications        Reviewed prescribed diet with patient heart healthy/ADA. The patient and his wife are mindful of dietary restrictions. ; Counseled on importance of regular laboratory monitoring as prescribed. Has labs on a regular basis. Education and support given;        Discussed plans with patient for ongoing care management follow up and provided patient with direct contact information for care management team;      Provided patient with written educational materials related to hypo and hyperglycemia and importance of correct treatment;       Reviewed scheduled/upcoming provider appointments including: 10-02-2022 at 240 pm, saw the pcp on 06-05-2022, has an appointment today to have eyes checked, has several with specialist;         Advised patient, providing education and rationale, to check cbg twice daily and when you have symptoms of low or high blood sugar and record. At the time of the call the patients wife had not taken the patients blood sugars. She states it will improve and be better as the day improves. Education given for the goal of fasting to be 130 of less and post prandial of <180.    call provider for findings outside established  parameters;       Referral made to pharmacy team for assistance with ongoing support and education for medication needs., The patient works with the [place] D on a regular basis to support and provided ongoing help and education;       Review of patient status, including review of consultants reports, relevant laboratory and other test results, and medications completed;       Advised patient to discuss changes  in DM and other questions and concerns with provider;      Screening for signs and symptoms of depression related to chronic disease state;        Assessed social determinant of health barriers;  Eye appointment today 06-10-2022 Had foot exam on 06-05-2022. The patient is compliant with regular preventive care.         Symptom Management: Take medications as prescribed   Attend all scheduled provider appointments Call provider office for new concerns or questions  call the Suicide and Crisis Lifeline: 988 call the [place] National Suicide Prevention Lifeline: 303-417-5207 or TTY: (661)206-7798 TTY 731-414-1352) to talk to a trained counselor call 1-800-273-TALK (toll free, 24 hour hotline) if experiencing a Mental Health or [place]  check feet daily for cuts, sores or redness trim toenails straight across wash and dry feet carefully every day wear comfortable, cotton socks wear comfortable, well-fitting shoes  Follow Up Plan: Telephone follow up appointment with care management team member scheduled for: 07-22-2022 at 0900 am       CCM Expected Outcome:  Monitor, Self-Manage and Reduce Symptoms of Heart Failure       Current Barriers:  Knowledge Deficits related to the need to keep weights in balance and to monitor for changes that impact heart failure and cause exacerbations Care Coordination needs related to ongoing support and education needs in a patient with CHF Chronic Disease Management support and education needs related to effective management of CHF  Wt Readings from Last 3 Encounters:  06/05/22 201 lb 12.8 oz (91.5 kg)  04/29/22 201 lb 12.8 oz (91.5 kg)  03/25/22 199 lb (90.3 kg)    Planned Interventions: Basic overview and discussion of pathophysiology of Heart Failure reviewed Provided education on low sodium diet. The patient wife states they are really watching his sodium content in foods Reviewed Heart Failure Action Plan in depth and provided written  copy Assessed need for readable accurate scales in home. The patient has a mat that he weighs on daily that reports his weight to the heart failure clinic. His weight has been fluctuating some and last week he had to change the way he takes his his diuretic for a few days. His wife states his weight has come back down and he continues to submit his readings every day. Denies any acute changes today. He has parameters to go by to make sure he does not go into exacerbation. He and his wife have a good understanding of how to effectively manage his heart failure.  Provided education about placing scale on hard, flat surface Advised patient to weigh each morning after emptying bladder Discussed importance of daily weight and advised patient to weigh and record daily Reviewed role of diuretics in prevention of fluid overload and management of heart failure. The patient is working with the cardiologist for trying to figure out the best way to keep fluids and weight balanced. [name] compliant with medications. The wife knows to call for acute changes and get recommendations from the provider. Also is working  with the pharm D for effective management of his HF medications and other medications. Discussed the importance of keeping all appointments with provider. The patient saw the cardiologist on 03-25-2022. Sees on a regular basis. Knows to call for changes on new concerns. Saw pcp on 06-05-2022 and next appointment to follow up with the pcp will be 10-02-2022 at 240 pm Advised patient to discuss changes in heart failure with provider Screening for signs and symptoms of depression related to chronic disease state  Assessed social determinant of health barriers  Symptom Management: Take medications as prescribed   Attend all scheduled provider appointments Call provider office for new concerns or questions  call the Suicide and Crisis Lifeline: 988 call the [place] National Suicide Prevention Lifeline: 602-122-1881  or TTY: 703-670-0227 TTY 458-201-0132) to talk to a trained counselor call 1-800-273-TALK (toll free, 24 hour hotline) if experiencing a Mental Health or [place]  call office if I gain more than 2 pounds in one day or 5 pounds in one week track weight in diary use salt in moderation watch for swelling in feet, ankles and legs every day weigh myself daily develop a rescue plan follow rescue plan if symptoms flare-up track symptoms and what helps feel better or worse  Follow Up Plan: Telephone follow up appointment with care management team member scheduled for: 07-22-2022 at 09 am       CCM Expected Outcome:  Monitor, Self-Manage, and Reduce Symptoms of Hypertension       Current Barriers:  Chronic Disease Management support and education needs related to effective management of HTN BP Readings from Last 3 Encounters:  06/05/22 134/86  04/29/22 134/88  03/25/22 98/60     Planned Interventions: Evaluation of current treatment plan related to hypertension self management and patient's adherence to plan as established by provider. The patient is having more stable blood pressures. The patients wife states over all he is doing well. He has an appointment with the eye doctor today and will follow up with his specialist as scheduled.  Provided education to patient re: stroke prevention, s/s of heart attack and stroke; Reviewed prescribed diet heart healthy/ADA diet. The patient has a good appetite and is eating well. [name] mindful of dietary restrictions and follows a heart healthy/ADA diet  Reviewed medications with patient and discussed importance of compliance. The patient is compliant with medications. The patient is compliant with medications. Works with pharm D on a regular basis for effective management of medications. ;  Discussed plans with patient for ongoing care management follow up and provided patient with direct contact information for care management  team; Advised patient, providing education and rationale, to monitor blood pressure daily and record, calling PCP for findings outside established parameters;  Advised patient to discuss changes in blood pressures and heart health with provider; Provided education on prescribed diet heart healthy/ADA diet ;  Discussed complications of poorly controlled blood pressure such as heart disease, stroke, circulatory complications, vision complications, kidney impairment, sexual dysfunction;  Screening for signs and symptoms of depression related to chronic disease state;  Assessed social determinant of health barriers;   Symptom Management: Take medications as prescribed   Attend all scheduled provider appointments Call provider office for new concerns or questions  call the Suicide and Crisis Lifeline: 988 call the [place] National Suicide Prevention Lifeline: (331)056-7238 or TTY: (838) 331-5188 TTY 775-489-3751) to talk to a trained counselor call 1-800-273-TALK (toll free, 24 hour hotline) if experiencing a Mental Health or [place]  Crisis  check blood pressure 3 times per week write blood pressure results in a log or diary learn about high blood pressure keep a blood pressure log take blood pressure log to all doctor appointments call doctor for signs and symptoms of high blood pressure develop an action plan for high blood pressure keep all doctor appointments take medications for blood pressure exactly as prescribed report new symptoms to your doctor  Follow Up Plan: Telephone follow up appointment with care management team member scheduled for: 07-22-2022 at 0900 am          The patient verbalized understanding of instructions, educational materials, and care plan provided today and DECLINED offer to receive copy of patient instructions, educational materials, and care plan.   Telephone follow up appointment with care management team member scheduled for: 07-22-2022 at 0900 am

## 2022-06-10 NOTE — Chronic Care Management (AMB) (Signed)
Chronic Care Management CCM Pharmacy Note  06/10/2022 Name:  Lucas Caldwell MRN:  [id number] DOB:  [birth year]   Subjective: Lucas Caldwell is an 89 y.o. year old male who is a primary patient of [name], DO.  The CCM team was consulted for assistance with disease management and care coordination needs.    Engaged with patient/spouse by telephone for follow up visit for pharmacy case management and/or care coordination services.   Objective:  Medications Reviewed Today     Reviewed by Rennis Petty, RPH-CPP (Pharmacist) on 06/10/22 at 1127  Med List Status: <None>   Medication Order Taking? Sig Documenting Provider Last Dose Status Informant  ABRYSVO 120 MCG/0.5ML injection [id number]   [provider]  Active   acetaminophen (TYLENOL) 500 MG tablet [id number] Yes Take 1,000 mg by mouth 2 (two) times daily as needed for moderate pain or headache. [provider] Taking Active Spouse/Significant Other  Alcohol Swabs (B-D SINGLE USE SWABS REGULAR) PADS [id number]  Use to check blood sugar up to 2 times daily [name], DO  Active Spouse/Significant Other  allopurinol (ZYLOPRIM) 100 MG tablet [id number]  Take 1 tablet (100 mg total) by mouth daily. Theora Gianotti, NP  Active   aspirin 81 MG chewable tablet [id number]  Chew 81 mg by mouth daily. [provider]  Active Spouse/Significant Other  Blood Glucose Calibration (OT ULTRA/FASTTK CNTRL SOLN) SOLN [id number]   [provider]  Active   Blood Glucose Monitoring Suppl (ONE TOUCH ULTRA 2) w/Device KIT [id number]  SMARTSIG:Via Meter [provider]  Active   bosutinib (BOSULIF) 100 MG tablet [id number]  Take 100 mg by mouth at bedtime. Take with food. [provider]  Active Spouse/Significant Other           Med Note Jeanie Cooks Sep 15, 2019  8:38 AM)    Cholecalciferol 25 MCG (1000 UT) capsule [id number]  Take 1,000 Units by  mouth daily. [provider]  Active Spouse/Significant Other  gabapentin (NEURONTIN) 600 MG tablet [id number]  Take 600 mg by mouth 2 (two) times daily. [name], DO  Active   glucose blood [facility] VERIO) test strip [id number]  USE TO TEST BLOOD SUGAR EVERY MORNING AND EVERY NIGHT AT BEDTIME [name], DO  Active   Insulin Glargine ([name] KWIKPEN) 100 UNIT/ML [id number]  Inject 22 Units into the skin daily. [name], DO  Active   Insulin Pen Needle 31G X 5 MM MISC [id number]  Use to inject insulin daily. Karamalegos, [name], DO  Active   isosorbide mononitrate (IMDUR) 60 MG 24 hr tablet [id number]  Take 0.5 tablets (30 mg total) by mouth daily. [name], MD  Active   JARDIANCE 10 MG TABS tablet [id number]  TAKE 1 TABLET(10 MG) BY MOUTH DAILY WITH BREAKFAST Karamalegos, [name], DO  Active   Lancets (ONETOUCH DELICA PLUS [id number]) [place] [id number]  Use to check blood sugar twice daily [name], DO  Active   levothyroxine (SYNTHROID) 25 MCG tablet [id number]  TAKE 1 TABLET BY MOUTH EVERY MORNING BEFORE BREAKFAST Karamalegos, Alexander Lenna Sciara, DO  Active   losartan (COZAAR) 25 MG tablet [id number]  TAKE 1/2 TABLET(12.5 MG) BY MOUTH DAILY End, [name], MD  Active   Menthol-Zinc Oxide (GOLD BOND EX) [id number]  Apply 1 application topically daily as needed (muscle pain). [provider]  Active Spouse/Significant Other  metolazone (ZAROXOLYN) 2.5 MG tablet [id number]  Take 1 tablet (  2.5 mg total) by mouth once a week. Every Wednesday. Take this medication with potassium Bensimhon, Shaune Pascal, MD  Active   mirabegron ER (MYRBETRIQ) 25 MG TB24 tablet [id number]  Take 1 tablet (25 mg total) by mouth daily. Debroah Loop, PA-C  Active   Multiple Vitamin (MULTIVITAMIN WITH MINERALS) TABS tablet [id number]  Take 1 tablet by mouth daily. [provider]  Active Spouse/Significant Other  nitroGLYCERIN  (NITROSTAT) 0.4 MG SL tablet [id number]  For chest pain, tightness, or pressure. While sitting, place 1 tablet under tongue. May be used every 5 minutes as needed, for up to 15 minutes. Do not use more than 3 tablets. End, [name], MD  Active   Polyethyl Glycol-Propyl Glycol [facility]) [id number]  Place 1 drop into both eyes 3 (three) times daily as needed (dry/irritated eyes.).  [provider]  Active Spouse/Significant Other  polyethylene glycol (MIRALAX / GLYCOLAX) 17 g packet [id number]  Take 17 g by mouth daily as needed for mild constipation.  [provider]  Active Spouse/Significant Other  potassium chloride SA (KLOR-CON M) 20 MEQ tablet [id number]  TAKE 2 TABLETS BY MOUTH ONCE A WEEK WITH METOLAZONE EVERY WEDNESDAY Bensimhon, Shaune Pascal, MD  Active   ranolazine (RANEXA) 500 MG 12 hr tablet [id number]  Take 1 tablet (500 mg total) by mouth 2 (two) times daily. End, [name], MD  Active   rosuvastatin (CRESTOR) 5 MG tablet [id number]  TAKE 1 TABLET(5 MG) BY MOUTH DAILY End, [name], MD  Active   tamsulosin (FLOMAX) 0.4 MG CAPS capsule [id number]  Take 1 capsule (0.4 mg total) by mouth daily after supper. Rise Mu, PA-C  Active Spouse/Significant Other  torsemide (DEMADEX) 20 MG tablet [id number]  TAKE 4 TABLETS(80 MG) BY MOUTH TWICE DAILY Bensimhon, Shaune Pascal, MD  Active   traMADol (ULTRAM) 50 MG tablet [id number]  Take 1-2 tablets (50-100 mg total) by mouth at bedtime as needed. [name], DO  Active Spouse/Significant Other  warfarin (COUMADIN) 3 MG tablet [id number]  TAKE 1-1.5 TABLETS BY MOUTH AS DIRECTED BY THE COUMADIN CLINIC End, [name], MD  Active             Pertinent Labs:   Lab Results  Component Value Date   CREATININE 1.67 (H) 03/25/2022   BUN 38 (H) 03/25/2022   NA 138 03/25/2022   K 3.9 03/25/2022   CL 103 03/25/2022   CO2 26 03/25/2022    SDOH:  (Social Determinants of Health) assessments and interventions performed:   SDOH Interventions    Flowsheet Row Chronic Care Management from 06/10/2022 in [place] Management from 01/28/2022 in [place] Coordination from 11/05/2021 in [place] Coordination Office Visit from 10/05/2021 in [place] from 09/03/2021 in [place] Management from 10/02/2020 in [facility]  SDOH Interventions        Food Insecurity Interventions Intervention Not Indicated -- Intervention Not Indicated -- Intervention Not Indicated --  Housing Interventions Intervention Not Indicated -- Intervention Not Indicated -- Intervention Not Indicated --  Transportation Interventions Intervention Not Indicated -- Intervention Not Indicated -- Intervention Not Indicated --  Utilities Interventions Intervention Not Indicated Intervention Not Indicated -- -- -- --  Alcohol Usage Interventions Intervention Not Indicated (Score <7) -- -- -- -- --  Depression Interventions/Treatment  -- -- -- Counseling -- --  Financial  Strain Interventions Intervention Not Indicated -- Intervention Not Indicated -- Intervention Not Indicated --  Physical Activity Interventions Other (Comments)  [encouraged activity, gets easily shob with activity] -- Other (Comments)  [gets really short of breath due to chronic conditions] -- -- Other (Comments)  [gets easily short of breath. Limited to the activity he can do]  Stress Interventions Intervention Not Indicated -- -- -- Intervention Not Indicated --  Social Connections Interventions Intervention Not Indicated -- -- -- Intervention Not Indicated --       CCM Care Plan  Review of patient past medical history, allergies, medications, health status, including review of consultants reports, laboratory and other test data, was performed as part of comprehensive  evaluation and provision of chronic care management services.   Care Plan : PharmD - Medication Management/Assistance  Updates made by Rennis Petty, RPH-CPP since 06/10/2022 12:00 AM     Problem: Disease Progression      Long-Range Goal: Disease Progression Prevented or Minimized   Start Date: 04/14/2020  Expected End Date: 07/13/2020  Recent Progress: On track  Priority: High  Note:   Current Barriers:  Chronic Disease Management support, education, and care coordination needs related to type 2 diabetes, CKD, HFrEF s/p CardioMEMS, hyperlipidemia, atrial fibrillation, hypertension, coronary artery disease, CHF, hypothyroidism, and CML Financial Note patient has Partial Extra Help/LIS Subsidy Patient enrolled in [place] patient assistance program for 2024 calendar year Patient denied for patient assistance for [place] from [facility] for 2022 calendar year Patient previously enrolled in [place] for [employer] copayment assistance, but eligibility ended as not in use ($0 copayment through Part D plan coverage for Jardiance) Limited vision  Pharmacist Clinical Goal(s):  Over the next 90 days, patient will verbalize ability to afford treatment regimen through collaboration with PharmD and provider.   Interventions: 1:1 collaboration with [name], DO regarding development and update of comprehensive plan of care as evidenced by provider attestation and co-signature Inter-disciplinary care team collaboration (see longitudinal plan of care) Received a message from PCP requesting outreach to patient for clarification regarding dose of gabapentin patient taking at home  Reach patient/spouse by phone today Based on latest creatine lab value of 1.61 mg/dL on 03/28/2022, calculate CrCl (using adjusted body weight) of ~34 ml/min Gabapentin package labeling recommends for CrCl of 30-59 mL/min, maximum dose of 1400 mg/day [name]  confirms patient has only the gabapentin 600 mg tablets at home. Reports patient currently take gabapentin 600 mg twice daily (1 tablet in morning and 1 tablet at bedtime), but wondering if could take three times daily Patient denies dizziness or side effects with current gabapentin use Reports patient also takes Tylenol 500 mg - 2 tablets (1000 mg) up to twice daily as needed Advise patient not to take gabapentin 600 mg more than twice daily Patient/spouse also interested in obtaining a different type of CPAP mask for patient Advise patient/spouse to follow up with his DME supplier, [employer] to ask about ordering this mask Patient expresses interest in using a different DME supplier in the future. Advise patient/spouse to follow up with patient's [employer] Medicare to find out if another DME supplier is in network/preferred for his plan    Patient Goals/Self-Care Activities Over the next 90 days, patient will:  Check blood sugar, blood pressure and fluid status as directed by providers  Take medications, with assistance of wife, as directed Mrs. Bussiere manages patient's medications using weekly pillbox as adherence tool Attends scheduled medical appointments  Plan: Telephone follow up appointment with care management team member scheduled for:  07/08/2022 at 10:00 AM  [name], PharmD, Para [place], [place] [phone]

## 2022-06-10 NOTE — Telephone Encounter (Signed)
Refill request

## 2022-06-10 NOTE — Chronic Care Management (AMB) (Signed)
Chronic Care Management   CCM RN Visit Note  06/10/2022 Name: Lucas Caldwell MRN: [id number] DOB: 05-21-1933  Subjective: Lucas Caldwell is a 89 y.o. year old male who is a primary care patient of [name], DO. The patient was referred to the Chronic Care Management team for assistance with care management needs subsequent to provider initiation of CCM services and plan of care.    Today's Visit:   spoke to the patients wife and DRP, [name]  for follow up visit.     SDOH Interventions Today    Flowsheet Row Most Recent Value  SDOH Interventions   Food Insecurity Interventions Intervention Not Indicated  Housing Interventions Intervention Not Indicated  Transportation Interventions Intervention Not Indicated  Utilities Interventions Intervention Not Indicated  Alcohol Usage Interventions Intervention Not Indicated (Score <7)  Financial Strain Interventions Intervention Not Indicated  Physical Activity Interventions Other (Comments)  [encouraged activity, gets easily shob with activity]  Stress Interventions Intervention Not Indicated  Social Connections Interventions Intervention Not Indicated         Goals Addressed             This Visit's Progress    CCM (DIABETES) EXPECTED OUTCOME: MONITOR, SELF-MANAGE AND REDUCE SYMPTOMS OF DIABETES       Current Barriers:  Knowledge Deficits related to the importance of blood sugars control in the patient with DM and multiple other chronic conditions Care Coordination needs related to ongoing support and education  in a patient with DM Chronic Disease Management support and education needs related to effective management of DM A1C goal of <8.0% Lab Results  Component Value Date   HGBA1C 7.5 (A) 06/05/2022     Planned Interventions: Provided education to patient about basic DM disease process. Review and education provided. The patients A1C is trending down. Praised for working hard to get A1C down.; Reviewed  medications with patient and discussed importance of medication adherence. The patient is compliant with medications. Is compliant with medications. Has medications and is working with the pharm D on a regular basis for effective management of medications and changes in medications        Reviewed prescribed diet with patient heart healthy/ADA. The patient and his wife are mindful of dietary restrictions. ; Counseled on importance of regular laboratory monitoring as prescribed. Has labs on a regular basis. Education and support given;        Discussed plans with patient for ongoing care management follow up and provided patient with direct contact information for care management team;      Provided patient with written educational materials related to hypo and hyperglycemia and importance of correct treatment;       Reviewed scheduled/upcoming provider appointments including: 10-02-2022 at 240 pm, saw the pcp on 06-05-2022, has an appointment today to have eyes checked, has several with specialist;         Advised patient, providing education and rationale, to check cbg twice daily and when you have symptoms of low or high blood sugar and record. At the time of the call the patients wife had not taken the patients blood sugars. She states it will improve and be better as the day improves. Education given for the goal of fasting to be 130 of less and post prandial of <180.    call provider for findings outside established parameters;       Referral made to pharmacy team for assistance with ongoing support and education for medication needs., The patient works  with the pharm D on a regular basis to support and provided ongoing help and education;       Review of patient status, including review of consultants reports, relevant laboratory and other test results, and medications completed;       Advised patient to discuss changes in DM and other questions and concerns with provider;      Screening for signs and  symptoms of depression related to chronic disease state;        Assessed social determinant of health barriers;  Eye appointment today 06-10-2022 Had foot exam on 06-05-2022. The patient is compliant with regular preventive care.         Symptom Management: Take medications as prescribed   Attend all scheduled provider appointments Call provider office for new concerns or questions  call the Suicide and Crisis Lifeline: 988 call the [place] National Suicide Prevention Lifeline: 7790365894 or TTY: 5414372555 TTY 660-577-9894) to talk to a trained counselor call 1-800-273-TALK (toll free, 24 hour hotline) if experiencing a Mental Health or [place]  check feet daily for cuts, sores or redness trim toenails straight across wash and dry feet carefully every day wear comfortable, cotton socks wear comfortable, well-fitting shoes  Follow Up Plan: Telephone follow up appointment with care management team member scheduled for: 07-22-2022 at 0900 am       CCM Expected Outcome:  Monitor, Self-Manage and Reduce Symptoms of Heart Failure       Current Barriers:  Knowledge Deficits related to the need to keep weights in balance and to monitor for changes that impact heart failure and cause exacerbations Care Coordination needs related to ongoing support and education needs in a patient with CHF Chronic Disease Management support and education needs related to effective management of CHF  Wt Readings from Last 3 Encounters:  06/05/22 201 lb 12.8 oz (91.5 kg)  04/29/22 201 lb 12.8 oz (91.5 kg)  03/25/22 199 lb (90.3 kg)    Planned Interventions: Basic overview and discussion of pathophysiology of Heart Failure reviewed Provided education on low sodium diet. The patient wife states they are really watching his sodium content in foods Reviewed Heart Failure Action Plan in depth and provided written copy Assessed need for readable accurate scales in home. The patient has a mat that  he weighs on daily that reports his weight to the heart failure clinic. His weight has been fluctuating some and last week he had to change the way he takes his his diuretic for a few days. His wife states his weight has come back down and he continues to submit his readings every day. Denies any acute changes today. He has parameters to go by to make sure he does not go into exacerbation. He and his wife have a good understanding of how to effectively manage his heart failure.  Provided education about placing scale on hard, flat surface Advised patient to weigh each morning after emptying bladder Discussed importance of daily weight and advised patient to weigh and record daily Reviewed role of diuretics in prevention of fluid overload and management of heart failure. The patient is working with the cardiologist for trying to figure out the best way to keep fluids and weight balanced. [name] compliant with medications. The wife knows to call for acute changes and get recommendations from the provider. Also is working with the pharm D for effective management of his HF medications and other medications. Discussed the importance of keeping all appointments with provider. The patient  saw the cardiologist on 03-25-2022. Sees on a regular basis. Knows to call for changes on new concerns. Saw pcp on 06-05-2022 and next appointment to follow up with the pcp will be 10-02-2022 at 240 pm Advised patient to discuss changes in heart failure with provider Screening for signs and symptoms of depression related to chronic disease state  Assessed social determinant of health barriers  Symptom Management: Take medications as prescribed   Attend all scheduled provider appointments Call provider office for new concerns or questions  call the Suicide and Crisis Lifeline: 988 call the [place] National Suicide Prevention Lifeline: (445)787-5139 or TTY: (581)537-5988 TTY (551) 816-4462) to talk to a trained counselor call  1-800-273-TALK (toll free, 24 hour hotline) if experiencing a Mental Health or [name]  call office if I gain more than 2 pounds in one day or 5 pounds in one week track weight in diary use salt in moderation watch for swelling in feet, ankles and legs every day weigh myself daily develop a rescue plan follow rescue plan if symptoms flare-up track symptoms and what helps feel better or worse  Follow Up Plan: Telephone follow up appointment with care management team member scheduled for: 07-22-2022 at 09 am       CCM Expected Outcome:  Monitor, Self-Manage, and Reduce Symptoms of Hypertension       Current Barriers:  Chronic Disease Management support and education needs related to effective management of HTN BP Readings from Last 3 Encounters:  06/05/22 134/86  04/29/22 134/88  03/25/22 98/60     Planned Interventions: Evaluation of current treatment plan related to hypertension self management and patient's adherence to plan as established by provider. The patient is having more stable blood pressures. The patients wife states over all he is doing well. He has an appointment with the eye doctor today and will follow up with his specialist as scheduled.  Provided education to patient re: stroke prevention, s/s of heart attack and stroke; Reviewed prescribed diet heart healthy/ADA diet. The patient has a good appetite and is eating well. [name] mindful of dietary restrictions and follows a heart healthy/ADA diet  Reviewed medications with patient and discussed importance of compliance. The patient is compliant with medications. The patient is compliant with medications. Works with pharm D on a regular basis for effective management of medications. ;  Discussed plans with patient for ongoing care management follow up and provided patient with direct contact information for care management team; Advised patient, providing education and rationale, to monitor blood pressure daily  and record, calling PCP for findings outside established parameters;  Advised patient to discuss changes in blood pressures and heart health with provider; Provided education on prescribed diet heart healthy/ADA diet ;  Discussed complications of poorly controlled blood pressure such as heart disease, stroke, circulatory complications, vision complications, kidney impairment, sexual dysfunction;  Screening for signs and symptoms of depression related to chronic disease state;  Assessed social determinant of health barriers;   Symptom Management: Take medications as prescribed   Attend all scheduled provider appointments Call provider office for new concerns or questions  call the Suicide and Crisis Lifeline: 988 call the [place] National Suicide Prevention Lifeline: 509-881-5479 or TTY: 317-749-3610 TTY 289-884-8018) to talk to a trained counselor call 1-800-273-TALK (toll free, 24 hour hotline) if experiencing a Mental Health or [place]  check blood pressure 3 times per week write blood pressure results in a log or diary learn about high blood pressure keep a  blood pressure log take blood pressure log to all doctor appointments call doctor for signs and symptoms of high blood pressure develop an action plan for high blood pressure keep all doctor appointments take medications for blood pressure exactly as prescribed report new symptoms to your doctor  Follow Up Plan: Telephone follow up appointment with care management team member scheduled for: 07-22-2022 at 0900 am          Plan:Telephone follow up appointment with care management team member scheduled for:  07-22-2022 at 0900 am  [place], MSN, CCM RN Care Manager  Chronic Care Management Direct Number: [phone]

## 2022-06-10 NOTE — Patient Instructions (Signed)
Visit Information  Thank you for taking time to visit with me today. Please don't hesitate to contact me if I can be of assistance to you before our next scheduled telephone appointment.  Following are the goals we discussed today:   Goals Addressed             This Visit's Progress    Pharmacy Goals       Please check your blood sugar, keep a log of the results and bring this with you to your medical appointments.  Please check your home blood pressure, keep a log of the results and bring this with you to your medical appointments.  Our goal bad cholesterol, or LDL, is less than 70 . This is why it is important to continue taking your rosuvastatin.  Feel free to call me with any questions or concerns. I look forward to our next call!    [name], PharmD, Para March, CPP Clinical Pharmacist [facility] [phone]          Our next appointment is by telephone on 07/08/2022 at 10:00 AM  Please call the care guide team at [phone] if you need to cancel or reschedule your appointment.    The patient verbalized understanding of instructions, educational materials, and care plan provided today and DECLINED offer to receive copy of patient instructions, educational materials, and care plan.

## 2022-06-16 DIAGNOSIS — I5040 Unspecified combined systolic (congestive) and diastolic (congestive) heart failure: Secondary | ICD-10-CM

## 2022-06-16 DIAGNOSIS — I110 Hypertensive heart disease with heart failure: Secondary | ICD-10-CM | POA: Diagnosis not present

## 2022-06-16 DIAGNOSIS — E1159 Type 2 diabetes mellitus with other circulatory complications: Secondary | ICD-10-CM

## 2022-06-16 DIAGNOSIS — Z794 Long term (current) use of insulin: Secondary | ICD-10-CM

## 2022-06-18 ENCOUNTER — Telehealth (HOSPITAL_COMMUNITY): Payer: Self-pay | Admitting: Adult Health

## 2022-06-18 NOTE — Telephone Encounter (Signed)
  Cardiomems Remote Monitoring  S/P Cardiomems Implant   PAD Goal: 19 Most recent reading: 30  Suggestive of volume accumulation.   Recommended changes: Increase torsemide to 100 mg twice a day x 4 days then back to 80 mg twice a day. Please call.   I continue to review and analyze the patients PA pressures weekly (and more often as needed) to bring PA pressures within the optimal range.      Jacque Garrels NP-C  9:28 AM

## 2022-06-20 NOTE — Telephone Encounter (Signed)
Pt aware via wife 

## 2022-06-25 ENCOUNTER — Other Ambulatory Visit: Payer: Self-pay | Admitting: Internal Medicine

## 2022-06-26 ENCOUNTER — Encounter (INDEPENDENT_AMBULATORY_CARE_PROVIDER_SITE_OTHER): Payer: Medicare HMO

## 2022-07-03 ENCOUNTER — Ambulatory Visit: Payer: Medicare HMO | Attending: Internal Medicine

## 2022-07-07 ENCOUNTER — Other Ambulatory Visit: Payer: Self-pay | Admitting: Internal Medicine

## 2022-07-07 DIAGNOSIS — I480 Paroxysmal atrial fibrillation: Secondary | ICD-10-CM

## 2022-07-08 ENCOUNTER — Other Ambulatory Visit
Admission: RE | Admit: 2022-07-08 | Discharge: 2022-07-08 | Disposition: A | Discharge status: Home / Self Care | Payer: Medicare HMO | Source: Ambulatory Visit | Attending: Internal Medicine | Admitting: Internal Medicine

## 2022-07-08 ENCOUNTER — Ambulatory Visit (HOSPITAL_BASED_OUTPATIENT_CLINIC_OR_DEPARTMENT_OTHER): Payer: Medicare HMO | Admitting: Internal Medicine

## 2022-07-08 ENCOUNTER — Encounter: Payer: Self-pay | Admitting: Internal Medicine

## 2022-07-08 ENCOUNTER — Inpatient Hospital Stay: Admit: 2022-07-08 | Payer: Self-pay

## 2022-07-08 ENCOUNTER — Ambulatory Visit
Admission: RE | Admit: 2022-07-08 | Discharge: 2022-07-08 | Disposition: A | Discharge status: Home / Self Care | Payer: Medicare HMO | Source: Ambulatory Visit | Attending: Internal Medicine | Admitting: Internal Medicine

## 2022-07-08 ENCOUNTER — Ambulatory Visit: Payer: Medicare HMO | Admitting: Pharmacist

## 2022-07-08 VITALS — BP 107/73 | HR 92 | Resp 16 | Wt 199.0 lb

## 2022-07-08 DIAGNOSIS — I509 Heart failure, unspecified: Secondary | ICD-10-CM

## 2022-07-08 DIAGNOSIS — I5022 Chronic systolic (congestive) heart failure: Secondary | ICD-10-CM

## 2022-07-08 DIAGNOSIS — I2510 Atherosclerotic heart disease of native coronary artery without angina pectoris: Secondary | ICD-10-CM | POA: Insufficient documentation

## 2022-07-08 DIAGNOSIS — Z01818 Encounter for other preprocedural examination: Secondary | ICD-10-CM | POA: Diagnosis not present

## 2022-07-08 DIAGNOSIS — R059 Cough, unspecified: Secondary | ICD-10-CM | POA: Diagnosis not present

## 2022-07-08 DIAGNOSIS — N183 Chronic kidney disease, stage 3 unspecified: Secondary | ICD-10-CM | POA: Insufficient documentation

## 2022-07-08 LAB — BASIC METABOLIC PANEL
Anion gap: 11 (ref 5–15)
BUN: 28 mg/dL — ABNORMAL HIGH (ref 8–23)
CO2: 25 mmol/L (ref 22–32)
Calcium: 9.1 mg/dL (ref 8.9–10.3)
Chloride: 104 mmol/L (ref 98–111)
Creatinine, Ser: 1.81 mg/dL — ABNORMAL HIGH (ref 0.61–1.24)
GFR, Estimated: 36 mL/min — ABNORMAL LOW (ref >60)
Glucose, Bld: 111 mg/dL — ABNORMAL HIGH (ref 70–99)
Potassium: 4.5 mmol/L (ref 3.5–5.1)
Sodium: 140 mmol/L (ref 135–145)

## 2022-07-08 LAB — BRAIN NATRIURETIC PEPTIDE: B Natriuretic Peptide: 503.3 pg/mL — ABNORMAL HIGH (ref 0.0–100.0)

## 2022-07-08 MED ORDER — FUROSCIX 80 MG/10ML ~~LOC~~ CTKT: 80.0000 mg | CARTRIDGE | Freq: Every day | SUBCUTANEOUS | Status: DC | PRN

## 2022-07-08 NOTE — Chronic Care Management (AMB) (Signed)
Chronic Care Management CCM Pharmacy Note  07/08/2022 Name:  KYLON PHILBROOK MRN:  [id number] DOB:  1934/02/10   Subjective: Lucas Caldwell is an 89 y.o. year old male who is a primary patient of Smitty Cords, DO.  The CCM team was consulted for assistance with disease management and care coordination needs.    Engaged with patient/spouse by telephone for follow up visit for pharmacy case management and/or care coordination services.   Patient and wife advise patient has had increased shortness of breath since Friday. Note [facility] and has CardioMEMS remote monitoring. Advise patient/wife to hang up and call [facility] now. Phone number provided.  Objective:  Medications Reviewed Today     Reviewed by Simonne Maffucci, RN (Registered Nurse) on 07/08/22 at 1352  Med List Status: <None>   Medication Order Taking? Sig Documenting Provider Last Dose Status Informant  ABRYSVO 120 MCG/0.5ML injection [id number] No  [provider] Unknown Active   acetaminophen (TYLENOL) 500 MG tablet [id number] Yes Take 1,000 mg by mouth 2 (two) times daily as needed for moderate pain or headache. [provider] Taking Active Spouse/Significant Other  Alcohol Swabs (B-D SINGLE USE SWABS REGULAR) PADS [id number] Yes Use to check blood sugar up to 2 times daily Smitty Cords, DO Taking Active Spouse/Significant Other  allopurinol (ZYLOPRIM) 100 MG tablet [id number] Yes Take 1 tablet (100 mg total) by mouth daily. Creig Hines, NP Taking Active   aspirin 81 MG chewable tablet [id number] Yes Chew 81 mg by mouth daily. [provider] Taking Active Spouse/Significant Other  Blood Glucose Calibration (OT ULTRA/FASTTK CNTRL SOLN) Criss Rosales [id number] Yes  [provider] Taking Active   Blood Glucose Monitoring Suppl (ONE TOUCH ULTRA 2) w/Device KIT [id number] Yes SMARTSIG:Via Meter [provider] Taking Active   bosutinib (BOSULIF) 100 MG tablet [id number] Yes Take 100 mg by mouth at bedtime. Take with food. [provider] Taking Active Spouse/Significant Other           Med Note Hetty Ely Sep 15, 2019  8:38 AM)    Cholecalciferol 25 MCG (1000 UT) capsule [id number] Yes Take 1,000 Units by mouth daily. [provider] Taking Active Spouse/Significant Other  gabapentin (NEURONTIN) 600 MG tablet [id number] Yes Take 600 mg by mouth 2 (two) times daily. Smitty Cords, DO Taking Active   glucose blood [facility] VERIO) test strip [id number] Yes USE TO TEST BLOOD SUGAR EVERY MORNING AND EVERY NIGHT AT BEDTIME Smitty Cords, DO Taking Active   Insulin Glargine ([name] KWIKPEN) 100 UNIT/ML [id number] Yes Inject 22 Units into the skin daily. Smitty Cords, DO Taking Active   Insulin Pen Needle 31G X 5 MM MISC [id number] Yes Use to inject insulin daily. Smitty Cords, DO Taking Active   isosorbide mononitrate (IMDUR) 60 MG 24 hr tablet [id number] Yes Take 0.5 tablets (30 mg total) by mouth daily. Yvonne Kendall, MD Taking Active   JARDIANCE 10 MG TABS tablet [id number] Yes TAKE 1 TABLET(10 MG) BY MOUTH DAILY WITH BREAKFAST [name], [name], DO Taking Active   Lancets [facility] DELICA PLUS [place]) MISC [id number] Yes Use to check blood sugar twice daily Smitty Cords, DO Taking Active   levothyroxine (SYNTHROID) 25 MCG tablet [id number] Yes TAKE 1 TABLET BY MOUTH EVERY MORNING BEFORE BREAKFAST Smitty Cords, DO Taking Active   losartan (COZAAR) 25 MG tablet [id number] Yes TAKE 1/2 TABLET(12.5 MG) BY  MOUTH DAILY End, [name], MD Taking Active   Menthol-Zinc Oxide (GOLD [place]) [id number] Yes Apply 1 application topically daily as needed (muscle pain). [provider] Taking Active Spouse/Significant Other  metolazone (ZAROXOLYN) 2.5 MG tablet [id number] No Take 1 tablet (2.5 mg  total) by mouth once a week. Every Wednesday. Take this medication with potassium  Patient not taking: Reported on 07/08/2022   Bensimhon, Bevelyn Buckles, MD Not Taking Active   mirabegron ER (MYRBETRIQ) 25 MG TB24 tablet [id number] Yes Take 1 tablet (25 mg total) by mouth daily. Carman Ching, PA-C Taking Active   Multiple Vitamin (MULTIVITAMIN WITH MINERALS) TABS tablet [id number] Yes Take 1 tablet by mouth daily. [provider] Taking Active Spouse/Significant Other  nitroGLYCERIN (NITROSTAT) 0.4 MG SL tablet [id number] No For chest pain, tightness, or pressure. While sitting, place 1 tablet under tongue. May be used every 5 minutes as needed, for up to 15 minutes. Do not use more than 3 tablets.  Patient not taking: Reported on 07/08/2022   Yvonne Kendall, MD Not Taking Active   Polyethyl Glycol-Propyl Glycol [facility] OP) [id number] Yes Place 1 drop into both eyes 3 (three) times daily as needed (dry/irritated eyes.).  [provider] Taking Active Spouse/Significant Other  polyethylene glycol (MIRALAX / GLYCOLAX) 17 g packet [id number] Yes Take 17 g by mouth daily as needed for mild constipation.  [provider] Taking Active Spouse/Significant Other  potassium chloride SA (KLOR-CON M) 20 MEQ tablet [id number] Yes TAKE 2 TABLETS BY MOUTH ONCE A WEEK WITH METOLAZONE EVERY WEDNESDAY  Patient taking differently: Taking one potassium tablet every other day   Bensimhon, Bevelyn Buckles, MD Taking Active   ranolazine (RANEXA) 500 MG 12 hr tablet [id number] Yes Take 1 tablet (500 mg total) by mouth 2 (two) times daily. End, [name], MD Taking Active   rosuvastatin (CRESTOR) 5 MG tablet [id number] Yes TAKE 1 TABLET(5 MG) BY MOUTH DAILY End, [name], MD Taking Active   tamsulosin (FLOMAX) 0.4 MG CAPS capsule [id number] Yes Take 1 capsule (0.4 mg total) by mouth daily after supper. Sondra Barges, PA-C Taking Active Spouse/Significant Other  torsemide (DEMADEX) 20 MG tablet [id number]  Yes TAKE 4 TABLETS(80 MG) BY MOUTH TWICE DAILY Bensimhon, Bevelyn Buckles, MD Taking Active   traMADol (ULTRAM) 50 MG tablet [id number] Yes Take 1-2 tablets (50-100 mg total) by mouth at bedtime as needed. Smitty Cords, DO Taking Active Spouse/Significant Other  warfarin (COUMADIN) 3 MG tablet [id number] Yes TAKE 1 TO 1 AND 1/2 TABLET BY MOUTH ONCE A DAY AS DIRECTED BY COUMADIN CLINIC End, [name], MD Taking Active             Pertinent Labs:  Lab Results  Component Value Date   HGBA1C 7.5 (A) 06/05/2022   Lab Results  Component Value Date   CHOL 156 12/05/2021   HDL 33 (L) 12/05/2021   LDLCALC 92 12/05/2021   TRIG 211 (H) 12/05/2021   CHOLHDL 4.7 12/05/2021   Lab Results  Component Value Date   CREATININE 1.81 (H) 07/08/2022   BUN 28 (H) 07/08/2022   NA 140 07/08/2022   K 4.5 07/08/2022   CL 104 07/08/2022   CO2 25 07/08/2022    SDOH:  (Social Determinants of Health) assessments and interventions performed:  SDOH Interventions    Flowsheet Row Chronic Care Management from 06/10/2022 in [facility] Chronic Care Management from 01/28/2022 in [facility] Coordination from 11/05/2021 in [facility]  Care Coordination Office Visit from 10/05/2021 in [facility] Clinical Support from 09/03/2021 in [facility] Chronic Care Management from 10/02/2020 in [facility]  SDOH Interventions        Food Insecurity Interventions Intervention Not Indicated -- Intervention Not Indicated -- Intervention Not Indicated --  Housing Interventions Intervention Not Indicated -- Intervention Not Indicated -- Intervention Not Indicated --  Transportation Interventions Intervention Not Indicated -- Intervention Not Indicated -- Intervention Not Indicated --  Utilities Interventions Intervention Not Indicated Intervention Not Indicated  -- -- -- --  Alcohol Usage Interventions Intervention Not Indicated (Score <7) -- -- -- -- --  Depression Interventions/Treatment  -- -- -- Counseling -- --  Financial Strain Interventions Intervention Not Indicated -- Intervention Not Indicated -- Intervention Not Indicated --  Physical Activity Interventions Other (Comments)  [encouraged activity, gets easily shob with activity] -- Other (Comments)  [gets really short of breath due to chronic conditions] -- -- Other (Comments)  [gets easily short of breath. Limited to the activity he can do]  Stress Interventions Intervention Not Indicated -- -- -- Intervention Not Indicated --  Social Connections Interventions Intervention Not Indicated -- -- -- Intervention Not Indicated --       CCM Care Plan  Review of patient past medical history, allergies, medications, health status, including review of consultants reports, laboratory and other test data, was performed as part of comprehensive evaluation and provision of chronic care management services.   Care Plan : PharmD - Medication Management/Assistance  Updates made by Manuela Neptune, RPH-CPP since 07/08/2022 12:00 AM     Problem: Disease Progression      Long-Range Goal: Disease Progression Prevented or Minimized   Start Date: 04/14/2020  Expected End Date: 07/13/2020  Recent Progress: On track  Priority: High  Note:   Current Barriers:  Chronic Disease Management support, education, and care coordination needs related to type 2 diabetes, CKD, HFrEF s/p CardioMEMS, hyperlipidemia, atrial fibrillation, hypertension, coronary artery disease, CHF, hypothyroidism, and CML Financial Note patient has Partial Extra Help/LIS Subsidy Patient enrolled in [employer] patient assistance program for 2024 calendar year Patient denied for patient assistance for Jaridance from [facility] for 2022 calendar year Patient previously enrolled in [facility] fund for [employer]  copayment assistance, but eligibility ended as not in use ($0 copayment through Part D plan coverage for Jardiance) Limited vision  Pharmacist Clinical Goal(s):  Over the next 90 days, patient will verbalize ability to afford treatment regimen through collaboration with PharmD and provider.   Interventions: 1:1 collaboration with Smitty Cords, DO regarding development and update of comprehensive plan of care as evidenced by provider attestation and co-signature Inter-disciplinary care team collaboration (see longitudinal plan of care) Reach patient and wife who advise patient has not been feeling well since Friday. Report patient having increased shortness of breath since Friday. Note [facility] follows with [facility] and has CardioMEMS remote monitoring. Advise patient/wife to hang up and call [facility] now. Phone number provided. Review chart this afternoon to confirm patient reached office. Note patient has same day appointment with Dr. Gala Romney    Patient Goals/Self-Care Activities Over the next 90 days, patient will:  Check blood sugar, blood pressure and fluid status as directed by providers  Take medications, with assistance of wife, as directed Mrs. Pfefferle manages patient's medications using weekly pillbox as adherence tool Attends scheduled medical appointments  Plan: The care management team will reach out to the patient again over the next 14 days.  Estelle Grumbles, PharmD, Patsy [place], CPP Clinical Pharmacist [facility] [phone]

## 2022-07-08 NOTE — Telephone Encounter (Signed)
Refill request

## 2022-07-08 NOTE — Progress Notes (Signed)
[facility] Clinic Note   Date:  07/08/2022   ID:  Lucas Caldwell, DOB Oct 26, 1933, MRN [id number]  Location: Home  Provider location: [place] [facility] Type of Visit: Established patient   PCP:  Smitty Cords, DO  Cardiologist:  Yvonne Kendall, MD HF Cardiologist: Dr. Gala Romney  Chief Complaint:  Chief Complaint  Patient presents with   Congestive Heart Failure   Chest Pain   Heart failure follow up   HPI: Lucas Caldwell is a 89 y.o. 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 Okey Dupre for further evaluation of his HF.     Has h/o NICM previously followed by Dr. [name] at [facility]. He had good response to medical therapy in 2012 EF was reported to be 50%    Had PCI of his RCA CAD s/p RCA stent x2 08/2016 at [facility].    Echo 6/20 LVEF 35-40%   Cardiac cath 08/2018 70% LM stenosis with abnl FFR of 0.86, though drop in iFR was due to diffuse prox LAD disease and only small gradient involving ostial LM. Imdur was increased.    Admitted 04/13/19 with chest pain and A/C systolic heart failure. Repeat Cath 1/21 LM 60% LAD 70% with +FFR otherwise diffuse non-obstructive CAD -> PCI/DES LAD 1/21. RHC: RA 10 PA 53/30 (40) PCWP 28 CO/CI 4.2/2.1 PA sat 67%.   Echo 04/22/19 EF 30-35%   Referred to [facility] 04/2019 for further management of his systolic HF. Had cardiomems implanted in 7/22   Zio 4/21 SR w/ 1st AVB, avg HR 81 bpm, 1 brief 5 beat run of NSVT, Two runs SVT   Echo 8/22 EF 30-35% RV ok   Echo 1/23 EF 30-35%  Cardiomems was running high last week and torsemide increased to 80 bid. PAD on cardiomems 27 ->-> 15 ->-> 21-> 23 (on 11/7)  Labs on 11/3 Scr 1.73 -> 1.99  Previously torsemide dropped back to 60 daily (? bid) and metolazone added every Wednesday. Did not tolerate and developed  AKI. Saw Ward Givens and torsemide increased from 60 bid to 80/60 for elevated CardioMems (20)    Seen in ED 02/24/22 with AMS and found to have flu A  Earlier this month PAD elevated on Cardiomems and torsemide increased for 2 days from 80 bid to 100 bid. But no change   Here with his wife for unscheduled visit for dyspnea. Here with his wife. Says last week had an episode of chest tightness but resolved spontaneously. Very anxious today. Says he is all torn up. Speech pressured. Has been shaking. Weight running 190-193 at home (previous baseline 187-193). Says he is drinking a lot of water because he is thirsty. Previous ReDS 46% today he is 40%. Taking torsemide 80 bid. Not taking metolazone. Weight at home 187-193 (today 191)  Cardiomems PAD 33 (goal 19)   Past Medical History:  Diagnosis Date   Arthritis    "probably in his legs" (08/15/2016)   Blind right eye    BPH (benign prostatic hyperplasia)    Breast asymmetry    Left breast is larger, present for several years.   CAD (coronary artery disease)    a. Cath in the late 90's - reportedly ok;  b. 2014 s/p stenting x 2 @ [facility]; c 08/19/16 Cath/PCI with DES -> RCA, plan to  treat LM 70% medically. Seen by surgery and felt to be too high risk for CABG; d. 08/2018 Cath: LM 70 (iFR 0.86), LAD 20ost, 40p, 50/47m, D1 70, LCX patent stent, OM1 30, RCA patent stent, 43m ISR, 40d, RPDA 30. PCWP 8. CO/CI 4.0/2.1; e. 03/2019 PCI to LAD (2.5x15 Resolute Onyx DES).   Chronic combined systolic (congestive) and diastolic (congestive) heart failure    a. Previously reduced EF-->50% by echo in 2012;  b. 06/2015 Echo: EF 50-55%  c. 07/2016 Echo: EF 45-50%; d. 12/2017 Echo: EF 55%; e. 08/2018 Echo: EF 35-40%; f. 04/2019 Echo: EF 30-35%; g. s/p cardiomems; h. 10/2020 Echo: EF 30-35%; i. 01/2022 Echo: EF 30-35%, glob HK, GrII DD, nl rV fxn, mild to mod MR.   Chronic kidney disease (CKD), stage IV (severe)    CML (chronic myelocytic leukemia)    GERD (gastroesophageal  reflux disease)    GIB (gastrointestinal bleeding)    a. 12/2017 3 unit PRBC GIB in setting of coumadin-->Endo/colon multiple duodenal ulcers and a single bleeding ulcer in the proximal ascending colon status post hemostatic clipping x2.   Gout    Hyperlipemia    Hypertension    Hypothyroidism    Iron deficiency anemia    Ischemic cardiomyopathy    a. Previously reduced EF-->50% by echo in 2012;  b. 06/2015 Echo: EF 50-55%, Gr2 DD;  c. 07/2016 Echo: EF 45-50%; d. 12/2017 Echo: EF 55%, Gr1 DD, mild MR; e. 08/2018 Echo: EF 35-40%; f. 04/2019 Echo: EF 30-35%; g. 10/2020 Echo: EF 30-35%; h. Echo: EF 30-35%   Migraine    "in the 1960s" (08/15/2016)   Obstructive sleep apnea    Orthostatic hypotension    a. 20222 -->carvedilol and hydralazine d/c'd.   PAF (paroxysmal atrial fibrillation)    a. ? Dx 2014-->s/p DCCV;  b. CHA2DS2VASc = 6--> Coumadin.   Prostate cancer    RBBB    Type II diabetes mellitus    Past Surgical History:  Procedure Laterality Date   AMPUTATION TOE Right 02/23/2021   Procedure: AMPUTATION TOE;  Surgeon: Gwyneth Revels, DPM;  Location: [facility] ORS;  Service: Podiatry;  Laterality: Right;   CATARACT EXTRACTION W/ INTRAOCULAR LENS  IMPLANT, BILATERAL Bilateral    COLONOSCOPY N/A 01/13/2018   Procedure: COLONOSCOPY;  Surgeon: Toney Reil, MD;  Location: [facility] ENDOSCOPY;  Service: Gastroenterology;  Laterality: N/A;   COLONOSCOPY WITH PROPOFOL N/A 01/01/2018   Procedure: COLONOSCOPY WITH PROPOFOL;  Surgeon: Toney Reil, MD;  Location: [facility] ENDOSCOPY;  Service: Gastroenterology;  Laterality: N/A;   CORONARY ANGIOPLASTY WITH STENT PLACEMENT  09/2012   2 stents   CORONARY STENT INTERVENTION N/A 08/19/2016   Procedure: Coronary Stent Intervention;  Surgeon: Yvonne Kendall, MD;  Location: [facility] INVASIVE CV LAB;  Service: Cardiovascular;  Laterality: N/A;   CORONARY STENT INTERVENTION N/A 04/13/2019   Procedure: CORONARY STENT INTERVENTION;  Surgeon: Yvonne Kendall, MD;   Location: [facility] INVASIVE CV LAB;  Service: Cardiovascular;  Laterality: N/A;   ESOPHAGEAL MANOMETRY N/A 12/16/2017   Procedure: ESOPHAGEAL MANOMETRY (EM);  Surgeon: Toney Reil, MD;  Location: [facility] ENDOSCOPY;  Service: Gastroenterology;  Laterality: N/A;   ESOPHAGOGASTRODUODENOSCOPY N/A 12/20/2016   Procedure: ESOPHAGOGASTRODUODENOSCOPY (EGD);  Surgeon: Toney Reil, MD;  Location[name] ENDOSCOPY;  Service: Gastroenterology;  Laterality: N/A;   ESOPHAGOGASTRODUODENOSCOPY N/A 01/13/2018   Procedure: ESOPHAGOGASTRODUODENOSCOPY (EGD);  Surgeon: Toney Reil, MD;  Location: [facility] ENDOSCOPY;  Service: Gastroenterology;  Laterality: N/A;   ESOPHAGOGASTRODUODENOSCOPY (EGD) WITH PROPOFOL N/A 11/03/2017   Procedure: ESOPHAGOGASTRODUODENOSCOPY (  EGD) WITH PROPOFOL;  Surgeon: Toney Reil, MD;  Location: [facility] ENDOSCOPY;  Service: Gastroenterology;  Laterality: N/A;   EYE SURGERY     HERNIA REPAIR     "navel"   LEFT HEART CATH AND CORONARY ANGIOGRAPHY N/A 08/14/2016   Procedure: Left Heart Cath and Coronary Angiography;  Surgeon: Yvonne Kendall, MD;  Location: [facility] INVASIVE CV LAB;  Service: Cardiovascular;  Laterality: N/A;   PRESSURE SENSOR/CARDIOMEMS N/A 09/23/2019   Procedure: PRESSURE SENSOR/CARDIOMEMS;  Surgeon: Dolores Patty, MD;  Location: [facility] INVASIVE CV LAB;  Service: Cardiovascular;  Laterality: N/A;   RIGHT HEART CATH N/A 09/23/2019   Procedure: RIGHT HEART CATH;  Surgeon: Dolores Patty, MD;  Location: [facility] INVASIVE CV LAB;  Service: Cardiovascular;  Laterality: N/A;   RIGHT/LEFT HEART CATH AND CORONARY ANGIOGRAPHY N/A 09/01/2018   Procedure: RIGHT/LEFT HEART CATH AND CORONARY ANGIOGRAPHY;  Surgeon: Yvonne Kendall, MD;  Location: [facility] INVASIVE CV LAB;  Service: Cardiovascular;  Laterality: N/A;   RIGHT/LEFT HEART CATH AND CORONARY ANGIOGRAPHY N/A 04/13/2019   Procedure: RIGHT/LEFT HEART CATH AND CORONARY ANGIOGRAPHY;  Surgeon: Yvonne Kendall, MD;  Location: [facility]  INVASIVE CV LAB;  Service: Cardiovascular;  Laterality: N/A;   TOE AMPUTATION Right    "big toe"   Current Outpatient Medications  Medication Sig Dispense Refill   acetaminophen (TYLENOL) 500 MG tablet Take 1,000 mg by mouth 2 (two) times daily as needed for moderate pain or headache.     Alcohol Swabs (B-D SINGLE USE SWABS REGULAR) PADS Use to check blood sugar up to 2 times daily 200 each 3   allopurinol (ZYLOPRIM) 100 MG tablet Take 1 tablet (100 mg total) by mouth daily. 90 tablet 3   aspirin 81 MG chewable tablet Chew 81 mg by mouth daily.     Blood Glucose Calibration (OT ULTRA/FASTTK CNTRL SOLN) SOLN      Blood Glucose Monitoring Suppl (ONE TOUCH ULTRA 2) w/Device KIT SMARTSIG:Via Meter     bosutinib (BOSULIF) 100 MG tablet Take 100 mg by mouth at bedtime. Take with food.     Cholecalciferol 25 MCG (1000 UT) capsule Take 1,000 Units by mouth daily.     gabapentin (NEURONTIN) 600 MG tablet Take 600 mg by mouth 2 (two) times daily. 270 tablet 1   glucose blood (ONETOUCH VERIO) test strip USE TO TEST BLOOD SUGAR EVERY MORNING AND EVERY NIGHT AT BEDTIME 100 strip 0   Insulin Glargine ([name] KWIKPEN) 100 UNIT/ML Inject 22 Units into the skin daily. 6 mL 0   Insulin Pen Needle 31G X 5 MM MISC Use to inject insulin daily. 100 each 3   isosorbide mononitrate (IMDUR) 60 MG 24 hr tablet Take 0.5 tablets (30 mg total) by mouth daily. 45 tablet 2   JARDIANCE 10 MG TABS tablet TAKE 1 TABLET(10 MG) BY MOUTH DAILY WITH BREAKFAST 90 tablet 1   Lancets (ONETOUCH DELICA PLUS LANCET30G) MISC Use to check blood sugar twice daily 200 each 3   levothyroxine (SYNTHROID) 25 MCG tablet TAKE 1 TABLET BY MOUTH EVERY MORNING BEFORE BREAKFAST 90 tablet 2   losartan (COZAAR) 25 MG tablet TAKE 1/2 TABLET(12.5 MG) BY MOUTH DAILY 45 tablet 10   Menthol-Zinc Oxide (GOLD BOND EX) Apply 1 application topically daily as needed (muscle pain).     mirabegron ER (MYRBETRIQ) 25 MG TB24 tablet Take 1 tablet (25 mg total) by  mouth daily. 90 tablet 3   Multiple Vitamin (MULTIVITAMIN WITH MINERALS) TABS tablet Take 1 tablet by mouth daily.  Polyethyl Glycol-Propyl Glycol (SYSTANE OP) Place 1 drop into both eyes 3 (three) times daily as needed (dry/irritated eyes.).      polyethylene glycol (MIRALAX / GLYCOLAX) 17 g packet Take 17 g by mouth daily as needed for mild constipation.      potassium chloride SA (KLOR-CON M) 20 MEQ tablet TAKE 2 TABLETS BY MOUTH ONCE A WEEK WITH METOLAZONE EVERY WEDNESDAY (Patient taking differently: Taking one potassium tablet every other day) 10 tablet 3   ranolazine (RANEXA) 500 MG 12 hr tablet Take 1 tablet (500 mg total) by mouth 2 (two) times daily. 180 tablet 2   rosuvastatin (CRESTOR) 5 MG tablet TAKE 1 TABLET(5 MG) BY MOUTH DAILY 90 tablet 0   tamsulosin (FLOMAX) 0.4 MG CAPS capsule Take 1 capsule (0.4 mg total) by mouth daily after supper. 30 capsule 11   torsemide (DEMADEX) 20 MG tablet TAKE 4 TABLETS(80 MG) BY MOUTH TWICE DAILY 240 tablet 3   traMADol (ULTRAM) 50 MG tablet Take 1-2 tablets (50-100 mg total) by mouth at bedtime as needed. 30 tablet 2   warfarin (COUMADIN) 3 MG tablet TAKE 1 TO 1 AND 1/2 TABLET BY MOUTH ONCE A DAY AS DIRECTED BY COUMADIN CLINIC 45 tablet 1   ABRYSVO 120 MCG/0.5ML injection      metolazone (ZAROXOLYN) 2.5 MG tablet Take 1 tablet (2.5 mg total) by mouth once a week. Every Wednesday. Take this medication with potassium (Patient not taking: Reported on 07/08/2022) 10 tablet 3   nitroGLYCERIN (NITROSTAT) 0.4 MG SL tablet For chest pain, tightness, or pressure. While sitting, place 1 tablet under tongue. May be used every 5 minutes as needed, for up to 15 minutes. Do not use more than 3 tablets. (Patient not taking: Reported on 07/08/2022) 25 tablet 1   No current facility-administered medications for this visit.    Allergies:   Clopidogrel, Ciprofloxacin, Beta adrenergic blockers, Mirtazapine, and Spironolactone   Social History:  The patient  reports  that he quit smoking about 49 years ago. His smoking use included cigarettes. He started smoking about 69 years ago. He has a 35.00 pack-year smoking history. He has quit using smokeless tobacco. He reports that he does not drink alcohol and does not use drugs.   Family History:  The patient's family history includes Brain cancer in his father; Diabetes in his sister; Heart attack in his sister.   ROS:  Please see the history of present illness.   All other systems are personally reviewed and negative.   BP 130/74 (BP Location: Right Arm, Patient Position: Sitting)   Pulse 88   Resp 16   Ht 5\' 4"  (1.626 m)   Wt 193 lb 4 oz (87.7 kg)   SpO2 99%   BMI 33.17 kg/m   Exam:   General:  Obese male No resp difficulty. Very anxious.  HEENT: normal Neck: supple. no JVD. Carotids 2+ bilat; no bruits. No lymphadenopathy or thryomegaly appreciated. Cor: PMI nondisplaced. Regular rate & rhythm. No rubs, gallops or murmurs. Lungs: clear Abdomen: soft, nontender, nondistended. No hepatosplenomegaly. No bruits or masses. Good bowel sounds. Extremities: no cyanosis, clubbing, rash, edema Neuro: alert & orientedx3, cranial nerves grossly intact. moves all 4 extremities w/o difficulty. Affect pleasant but anxious   ECG: NSR 88 RBBB 1 AVB 204 ms Personally reviewed   02/05/2022: TSH 3.204 02/24/2022: ALT 12; Hemoglobin 13.4; Platelets 197 03/25/2022: B Natriuretic Peptide 180.6; BUN 38; Creatinine, Ser 1.67; Potassium 3.9; Sodium 138   Wt Readings from Last 3 Encounters:  07/08/22 199 lb (90.3 kg)  06/05/22 201 lb 12.8 oz (91.5 kg)  04/29/22 201 lb 12.8 oz (91.5 kg)      ASSESSMENT AND PLAN:  1. Chronic systolic HF: - likely combination of iCM/NICM - Echo (2/21): EF 30-35%  (was 35-40% in 6/20) - RHC (04/13/19): with elevated filling pressures but BNP normal  - Echo (8/22): EF 30-35%, grade I DD, RV low normal - Echo 11/23 EF 30-35% - Worse today NYHA IIIB-IV - Volume status looks ok on exam  and weight actually down from baseline. ReDS mildly elevated but improved from previous however Cardiomems level is way up. Given previous difficulty with metolazone will use Furoscix x 2 doses now and reassess next week. Will order several extra doses for him to use PRN and to be managed based on Cardiomems readings.  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  - Continue losartan 12.5 mg daily (dose decreased due to low BP).  - Continue Jardiance 10 mg daily - check labs  2. CAD - Cath 04/13/19 LAD 70% with +FFR otherwise diffuse non-obstructive CAD  - S/p PCI/DES to LAD 1/21 - Followed by Dr. Okey Dupre - No current angina.  Continue Ranexa per Dr. Okey Dupre.  - Continue medical management.  3. CKD Stage 3b - baseline SCr ~1.7-2.0 - labs today - Avoid hypotension.  4. DM  - Continue Jardiance 10 mg daily. - No GU symptoms  5. PAF - Zio patch 4/21 showed no AF. Mostly NSR w/ brief SVT/ NSVT, PACs and PVCs - Remains in NSR on ECG today - Continue warfarin - No bleeding  Total time spent 45 minutes. Over half that time spent discussing above.     Arvilla Meres, MD  2:38 PM   [facility] [address] Heart and Vascular [place] [address] [phone] (office) [phone] (fax)

## 2022-07-08 NOTE — Telephone Encounter (Signed)
Missed appt in [facility] on 07/03/22.  Called spoke with pt's wife, made follow-up appt on 07/08/22 in [facility].  Will refill rx.

## 2022-07-08 NOTE — Patient Instructions (Signed)
Please use Furoscix today and tomorrow. (If you do not receive the furoscix by mail tomorrow please use on Wednesday)  Your provider requests you have a chest x ray  Routine lab work today. Will notify you of abnormal results  Follow up next week with Dr.Sabharwal  Do the following things EVERYDAY: Weigh yourself in the morning before breakfast. Write it down and keep it in a log. Take your medicines as prescribed Eat low salt foods--Limit salt (sodium) to 2000 mg per day.  Stay as active as you can everyday Limit all fluids for the day to less than 2 liters

## 2022-07-08 NOTE — Patient Instructions (Signed)
Visit Information  Thank you for taking time to visit with me today. Please don't hesitate to contact me if I can be of assistance to you before our next scheduled telephone appointment.  Following are the goals we discussed today:   Goals Addressed             This Visit's Progress    Pharmacy Goals       Please check your blood sugar, keep a log of the results and bring this with you to your medical appointments.  Please check your home blood pressure, keep a log of the results and bring this with you to your medical appointments.  Our goal bad cholesterol, or LDL, is less than 70 . This is why it is important to continue taking your rosuvastatin.  Feel free to call me with any questions or concerns. I look forward to our next call!   Estelle Grumbles, PharmD, Patsy [place], CPP Clinical Pharmacist [facility] [phone]          Our next appointment is by telephone on 07/15/2022 at 9:15 am  Please call the care guide team at [phone] if you need to cancel or reschedule your appointment.    The patient verbalized understanding of instructions, educational materials, and care plan provided today and DECLINED offer to receive copy of patient instructions, educational materials, and care plan.

## 2022-07-08 NOTE — Progress Notes (Signed)
ReDS Vest / Clip - 07/08/22 1356       ReDS Vest / Clip   Station Marker B    Ruler Value 35    ReDS Value Range Moderate volume overload    ReDS Actual Value 40

## 2022-07-09 ENCOUNTER — Telehealth: Payer: Self-pay | Admitting: Physician Assistant

## 2022-07-09 ENCOUNTER — Ambulatory Visit (INDEPENDENT_AMBULATORY_CARE_PROVIDER_SITE_OTHER): Payer: Medicare HMO | Admitting: Physician Assistant

## 2022-07-09 VITALS — BP 132/82 | HR 114 | Ht 65.0 in | Wt 199.0 lb

## 2022-07-09 DIAGNOSIS — N3941 Urge incontinence: Secondary | ICD-10-CM | POA: Diagnosis not present

## 2022-07-09 LAB — MICROSCOPIC EXAMINATION: Bacteria, UA: NONE SEEN

## 2022-07-09 LAB — URINALYSIS, COMPLETE
Bilirubin, UA: NEGATIVE
Ketones, UA: NEGATIVE
Leukocytes,UA: NEGATIVE
Nitrite, UA: NEGATIVE
Protein,UA: NEGATIVE
RBC, UA: NEGATIVE
Specific Gravity, UA: 1.01 (ref 1.005–1.030)
Urobilinogen, Ur: 0.2 mg/dL (ref 0.2–1.0)
pH, UA: 6.5 (ref 5.0–7.5)

## 2022-07-09 LAB — BLADDER SCAN AMB NON-IMAGING: Scan Result: 283 ml

## 2022-07-09 MED ORDER — GEMTESA 75 MG PO TABS
75.0000 mg | ORAL_TABLET | Freq: Every day | ORAL | 0 refills | Status: DC
Start: 2022-07-09 — End: 2022-08-13

## 2022-07-09 NOTE — Telephone Encounter (Signed)
Please contact the patient and let him know that after he left clinic today, I saw that his bladder had been scanned on arrival.  His bladder residual was elevated over normal, 283 mL.  Before he starts taking the Gemtesa samples I provided him today, I would like for him to stop the Myrbetriq and plan to return to clinic in 1 to 2 weeks for repeat PVR.  His elevated residual could also be contributing to his urinary frequency.  He should remain on Flomax in the interim.

## 2022-07-09 NOTE — Progress Notes (Signed)
07/09/2022 4:15 PM   Lucas Caldwell 1933-07-28 [id number]  CC: Chief Complaint  Patient presents with   Follow-up   HPI: Lucas Caldwell is a 89 y.o. male with PMH diabetes on Jardiance, OSA, CKD, BPH with LUTS on Flomax, and urge incontinence on Myrbetriq 25 mg daily who presents today for evaluation of worsening urinary incontinence.  [name] accompanied today by his wife, who contributes to HPI.  Today he reports he thinks he is still on both Myrbetriq and Flomax.  His urinary leakage has worsened considerably over the past 4 to 5 months in the setting of starting diuretics.  Per chart review, he is on both torsemide and furosemide per Dr. Gala Romney.  He has been wearing Depends to manage this.  He denies dysuria or gross hematuria.  In-office UA today positive for 2+ glucose; urine microscopy pan negative.  PMH: Past Medical History:  Diagnosis Date   Arthritis    "probably in his legs" (08/15/2016)   Blind right eye    BPH (benign prostatic hyperplasia)    Breast asymmetry    Left breast is larger, present for several years.   CAD (coronary artery disease)    a. Cath in the late 90's - reportedly ok;  b. 2014 s/p stenting x 2 @ [facility]; c 08/19/16 Cath/PCI with DES -> RCA, plan to treat LM 70% medically. Seen by surgery and felt to be too high risk for CABG; d. 08/2018 Cath: LM 70 (iFR 0.86), LAD 20ost, 40p, 50/71m, D1 70, LCX patent stent, OM1 30, RCA patent stent, 50m ISR, 40d, RPDA 30. PCWP 8. CO/CI 4.0/2.1; e. 03/2019 PCI to LAD (2.5x15 Resolute Onyx DES).   Chronic combined systolic (congestive) and diastolic (congestive) heart failure    a. Previously reduced EF-->50% by echo in 2012;  b. 06/2015 Echo: EF 50-55%  c. 07/2016 Echo: EF 45-50%; d. 12/2017 Echo: EF 55%; e. 08/2018 Echo: EF 35-40%; f. 04/2019 Echo: EF 30-35%; g. s/p cardiomems; h. 10/2020 Echo: EF 30-35%; i. 01/2022 Echo: EF 30-35%, glob HK, GrII DD, nl rV fxn, mild to mod MR.   Chronic kidney disease (CKD), stage IV  (severe)    CML (chronic myelocytic leukemia)    GERD (gastroesophageal reflux disease)    GIB (gastrointestinal bleeding)    a. 12/2017 3 unit PRBC GIB in setting of coumadin-->Endo/colon multiple duodenal ulcers and a single bleeding ulcer in the proximal ascending colon status post hemostatic clipping x2.   Gout    Hyperlipemia    Hypertension    Hypothyroidism    Iron deficiency anemia    Ischemic cardiomyopathy    a. Previously reduced EF-->50% by echo in 2012;  b. 06/2015 Echo: EF 50-55%, Gr2 DD;  c. 07/2016 Echo: EF 45-50%; d. 12/2017 Echo: EF 55%, Gr1 DD, mild MR; e. 08/2018 Echo: EF 35-40%; f. 04/2019 Echo: EF 30-35%; g. 10/2020 Echo: EF 30-35%; h. Echo: EF 30-35%   Migraine    "in the 1960s" (08/15/2016)   Obstructive sleep apnea    Orthostatic hypotension    a. 20222 -->carvedilol and hydralazine d/c'd.   PAF (paroxysmal atrial fibrillation)    a. ? Dx 2014-->s/p DCCV;  b. CHA2DS2VASc = 6--> Coumadin.   Prostate cancer    RBBB    Type II diabetes mellitus     Surgical History: Past Surgical History:  Procedure Laterality Date   AMPUTATION TOE Right 02/23/2021   Procedure: AMPUTATION TOE;  Surgeon: Gwyneth Revels, DPM;  Location: [facility] ORS;  Service:  Podiatry;  Laterality: Right;   CATARACT EXTRACTION W/ INTRAOCULAR LENS  IMPLANT, BILATERAL Bilateral    COLONOSCOPY N/A 01/13/2018   Procedure: COLONOSCOPY;  Surgeon: Toney Reil, MD;  Location: [facility] ENDOSCOPY;  Service: Gastroenterology;  Laterality: N/A;   COLONOSCOPY WITH PROPOFOL N/A 01/01/2018   Procedure: COLONOSCOPY WITH PROPOFOL;  Surgeon: Toney Reil, MD;  Location: [facility] ENDOSCOPY;  Service: Gastroenterology;  Laterality: N/A;   CORONARY ANGIOPLASTY WITH STENT PLACEMENT  09/2012   2 stents   CORONARY STENT INTERVENTION N/A 08/19/2016   Procedure: Coronary Stent Intervention;  Surgeon: Yvonne Kendall, MD;  Location: [facility] CV LAB;  Service: Cardiovascular;  Laterality: N/A;   CORONARY STENT  INTERVENTION N/A 04/13/2019   Procedure: CORONARY STENT INTERVENTION;  Surgeon: Yvonne Kendall, MD;  Location: [facility] INVASIVE CV LAB;  Service: Cardiovascular;  Laterality: N/A;   ESOPHAGEAL MANOMETRY N/A 12/16/2017   Procedure: ESOPHAGEAL MANOMETRY (EM);  Surgeon: Toney Reil, MD;  Location: [facility] ENDOSCOPY;  Service: Gastroenterology;  Laterality: N/A;   ESOPHAGOGASTRODUODENOSCOPY N/A 12/20/2016   Procedure: ESOPHAGOGASTRODUODENOSCOPY (EGD);  Surgeon: Toney Reil, MD;  Location: [facility] ENDOSCOPY;  Service: Gastroenterology;  Laterality: N/A;   ESOPHAGOGASTRODUODENOSCOPY N/A 01/13/2018   Procedure: ESOPHAGOGASTRODUODENOSCOPY (EGD);  Surgeon: Toney Reil, MD;  Location: [facility] ENDOSCOPY;  Service: Gastroenterology;  Laterality: N/A;   ESOPHAGOGASTRODUODENOSCOPY (EGD) WITH PROPOFOL N/A 11/03/2017   Procedure: ESOPHAGOGASTRODUODENOSCOPY (EGD) WITH PROPOFOL;  Surgeon: Toney Reil, MD;  Location: [facility] ENDOSCOPY;  Service: Gastroenterology;  Laterality: N/A;   EYE SURGERY     HERNIA REPAIR     "navel"   LEFT HEART CATH AND CORONARY ANGIOGRAPHY N/A 08/14/2016   Procedure: Left Heart Cath and Coronary Angiography;  Surgeon: Yvonne Kendall, MD;  Location: [facility] INVASIVE CV LAB;  Service: Cardiovascular;  Laterality: N/A;   PRESSURE SENSOR/CARDIOMEMS N/A 09/23/2019   Procedure: PRESSURE SENSOR/CARDIOMEMS;  Surgeon: Dolores Patty, MD;  Location: [facility];  Service: Cardiovascular;  Laterality: N/A;   RIGHT HEART CATH N/A 09/23/2019   Procedure: RIGHT HEART CATH;  Surgeon: Dolores Patty, MD;  Location: [facility];  Service: Cardiovascular;  Laterality: N/A;   RIGHT/LEFT HEART CATH AND CORONARY ANGIOGRAPHY N/A 09/01/2018   Procedure: RIGHT/LEFT HEART CATH AND CORONARY ANGIOGRAPHY;  Surgeon: Yvonne Kendall, MD;  Location: [facility] INVASIVE CV LAB;  Service: Cardiovascular;  Laterality: N/A;   RIGHT/LEFT HEART CATH AND CORONARY ANGIOGRAPHY N/A 04/13/2019    Procedure: RIGHT/LEFT HEART CATH AND CORONARY ANGIOGRAPHY;  Surgeon: Yvonne Kendall, MD;  Location: [facility] INVASIVE CV LAB;  Service: Cardiovascular;  Laterality: N/A;   TOE AMPUTATION Right    "big toe"    Home Medications:  Allergies as of 07/09/2022       Reactions   Clopidogrel Anaphylaxis, Other (See Comments)   altered mental status;  (electrolytes "out of wack" and confusion per family; THEY DO NOT REMEMBER ANY OTHER REACTION)- MSB 10/10/15   Ciprofloxacin Itching   Beta Adrenergic Blockers    Nightmares   Mirtazapine Diarrhea   Bad dreams   Spironolactone Other (See Comments)   gynecomastia        Medication List        Accurate as of July 09, 2022  4:15 PM. If you have any questions, ask your nurse or doctor.          Abrysvo 120 MCG/0.5ML injection Generic drug: RSV bivalent vaccine   acetaminophen 500 MG tablet Commonly known as: TYLENOL Take 1,000 mg by mouth 2 (two) times daily as needed  for moderate pain or headache.   allopurinol 100 MG tablet Commonly known as: ZYLOPRIM Take 1 tablet (100 mg total) by mouth daily.   aspirin 81 MG chewable tablet Chew 81 mg by mouth daily.   B-D SINGLE USE SWABS REGULAR Pads Use to check blood sugar up to 2 times daily   [name] KwikPen 100 UNIT/ML Inject 22 Units into the skin daily.   bosutinib 100 MG tablet Commonly known as: BOSULIF Take 100 mg by mouth at bedtime. Take with food.   Cholecalciferol 25 MCG (1000 UT) capsule Take 1,000 Units by mouth daily.   Furoscix 80 MG/10ML Ctkt Generic drug: Furosemide Inject 80 mg into the skin daily as needed.   gabapentin 600 MG tablet Commonly known as: NEURONTIN Take 600 mg by mouth 2 (two) times daily.   GOLD BOND EX Apply 1 application topically daily as needed (muscle pain).   Insulin Pen Needle 31G X 5 MM Misc Use to inject insulin daily.   isosorbide mononitrate 60 MG 24 hr tablet Commonly known as: IMDUR Take 0.5 tablets (30 mg total) by  mouth daily.   Jardiance 10 MG Tabs tablet Generic drug: empagliflozin TAKE 1 TABLET(10 MG) BY MOUTH DAILY WITH BREAKFAST   levothyroxine 25 MCG tablet Commonly known as: SYNTHROID TAKE 1 TABLET BY MOUTH EVERY MORNING BEFORE BREAKFAST   losartan 25 MG tablet Commonly known as: COZAAR TAKE 1/2 TABLET(12.5 MG) BY MOUTH DAILY   metolazone 2.5 MG tablet Commonly known as: ZAROXOLYN Take 1 tablet (2.5 mg total) by mouth once a week. Every Wednesday. Take this medication with potassium   mirabegron ER 25 MG Tb24 tablet Commonly known as: MYRBETRIQ Take 1 tablet (25 mg total) by mouth daily.   multivitamin with minerals Tabs tablet Take 1 tablet by mouth daily.   nitroGLYCERIN 0.4 MG SL tablet Commonly known as: NITROSTAT For chest pain, tightness, or pressure. While sitting, place 1 tablet under tongue. May be used every 5 minutes as needed, for up to 15 minutes. Do not use more than 3 tablets.   ONE TOUCH ULTRA 2 w/Device Kit SMARTSIG:Via Meter   OneTouch Delica Plus Lancet30G Misc Use to check blood sugar twice daily   OneTouch Verio test strip Generic drug: glucose blood USE TO TEST BLOOD SUGAR EVERY MORNING AND EVERY NIGHT AT BEDTIME   OT ULTRA/FASTTK CNTRL SOLN Soln   polyethylene glycol 17 g packet Commonly known as: MIRALAX / GLYCOLAX Take 17 g by mouth daily as needed for mild constipation.   potassium chloride SA 20 MEQ tablet Commonly known as: KLOR-CON M TAKE 2 TABLETS BY MOUTH ONCE A WEEK WITH METOLAZONE EVERY WEDNESDAY What changed: See the new instructions.   ranolazine 500 MG 12 hr tablet Commonly known as: RANEXA Take 1 tablet (500 mg total) by mouth 2 (two) times daily.   rosuvastatin 5 MG tablet Commonly known as: CRESTOR TAKE 1 TABLET(5 MG) BY MOUTH DAILY   SYSTANE OP Place 1 drop into both eyes 3 (three) times daily as needed (dry/irritated eyes.).   tamsulosin 0.4 MG Caps capsule Commonly known as: FLOMAX Take 1 capsule (0.4 mg total) by  mouth daily after supper.   torsemide 20 MG tablet Commonly known as: DEMADEX TAKE 4 TABLETS(80 MG) BY MOUTH TWICE DAILY   traMADol 50 MG tablet Commonly known as: ULTRAM Take 1-2 tablets (50-100 mg total) by mouth at bedtime as needed.   warfarin 3 MG tablet Commonly known as: COUMADIN Take as directed by the anticoagulation clinic. If  you are unsure how to take this medication, talk to your nurse or doctor. Original instructions: TAKE 1 TO 1 AND 1/2 TABLET BY MOUTH ONCE A DAY AS DIRECTED BY COUMADIN CLINIC        Allergies:  Allergies  Allergen Reactions   Clopidogrel Anaphylaxis and Other (See Comments)    altered mental status;  (electrolytes "out of wack" and confusion per family; THEY DO NOT REMEMBER ANY OTHER REACTION)- MSB 10/10/15   Ciprofloxacin Itching   Beta Adrenergic Blockers     Nightmares   Mirtazapine Diarrhea    Bad dreams    Spironolactone Other (See Comments)    gynecomastia    Family History: Family History  Problem Relation Age of Onset   Brain cancer Father    Diabetes Sister    Heart attack Sister     Social History:   reports that he quit smoking about 49 years ago. His smoking use included cigarettes. He started smoking about 69 years ago. He has a 35.00 pack-year smoking history. He has quit using smokeless tobacco. He reports that he does not drink alcohol and does not use drugs.  Physical Exam: BP 132/82   Pulse (!) 114   Ht  (1.651 m)   Wt 199 lb (90.3 kg)   BMI 33.12 kg/m   Constitutional:  Alert and oriented, no acute distress, nontoxic appearing HEENT: [place], AT Cardiovascular: No clubbing, cyanosis, or edema Respiratory: Normal respiratory effort, no increased work of breathing Skin: No rashes, bruises or suspicious lesions Neurologic: Grossly intact, no focal deficits, moving all 4 extremities Psychiatric: Normal mood and affect  Laboratory Data: Results for orders placed or performed in visit on 07/09/22  Microscopic  Examination   Urine  Result Value Ref Range   WBC, UA 0-5 0 - 5 /hpf   RBC, Urine 0-2 0 - 2 /hpf   Epithelial Cells (non renal) 0-10 0 - 10 /hpf   Bacteria, UA None seen None seen/Few  Urinalysis, Complete  Result Value Ref Range   Specific Gravity, UA 1.010 1.005 - 1.030   pH, UA 6.5 5.0 - 7.5   Color, UA Yellow Yellow   Appearance Ur Clear Clear   Leukocytes,UA Negative Negative   Protein,UA Negative Negative/Trace   Glucose, UA 2+ (A) Negative   Ketones, UA Negative Negative   RBC, UA Negative Negative   Bilirubin, UA Negative Negative   Urobilinogen, Ur 0.2 0.2 - 1.0 mg/dL   Nitrite, UA Negative Negative   Microscopic Examination See below:    *Note: Due to a large number of results and/or encounters for the requested time period, some results have not been displayed. A complete set of results can be found in Results Review.   Assessment & Plan:   1. Urge incontinence of urine Multifactorial in the setting of BPH, loop diuretics, and Jardiance.  We discussed that I may not be able to resolve this problem, and our treatment goal needs to be symptom improvement rather than resolution.  I cannot increase his Myrbetriq dose due to his renal function.  Will switch him to [place], samples provided today.  Will plan for symptom recheck in 6 weeks.  They are in agreement with this plan. - Urinalysis, Complete - Vibegron (GEMTESA) 75 MG TABS; Take 1 tablet (75 mg total) by mouth daily.  Dispense: 42 tablet; Refill: 0   Return in about 6 weeks (around 08/20/2022) for Symptom recheck with PVR.  Carman Ching, PA-C  [facility] [phone]  [address], [place], [place] [address] [phone]

## 2022-07-09 NOTE — Patient Instructions (Signed)
CONTINUE Flomax (tamsulosin). STOP Myrbetriq (mirabegron). START Gemtesa (vibegron) instead. Take this once daily.

## 2022-07-10 ENCOUNTER — Ambulatory Visit: Payer: Medicare HMO | Attending: Internal Medicine

## 2022-07-10 DIAGNOSIS — Z7901 Long term (current) use of anticoagulants: Secondary | ICD-10-CM

## 2022-07-10 DIAGNOSIS — I480 Paroxysmal atrial fibrillation: Secondary | ICD-10-CM

## 2022-07-10 LAB — POCT INR: INR: 2.4 (ref 2.0–3.0)

## 2022-07-10 NOTE — Patient Instructions (Signed)
Description   Continue dosage of warfarin of 1.5 TABLETS EVERY DAY EXCEPT 1 TABLET ON MONDAYS & FRIDAYS.  Recheck INR in 6 weeks     

## 2022-07-11 NOTE — Telephone Encounter (Signed)
Spoke with wife and advised results, they will follow-up in one week.

## 2022-07-12 NOTE — Telephone Encounter (Signed)
Incoming call from pt's wife on triage line calling to confirm which medications the patient should be taking. Advised wife that pt should continue Tamsulosin (Flomax) and should hold all bladder medications until we see him back in office on 07/18/22. Reiterated not to take Gemtesa or Myrbetriq. Wife gave verbal understanding.

## 2022-07-15 ENCOUNTER — Encounter: Payer: Medicare HMO | Admitting: Cardiology

## 2022-07-15 ENCOUNTER — Ambulatory Visit (INDEPENDENT_AMBULATORY_CARE_PROVIDER_SITE_OTHER): Payer: Medicare HMO | Admitting: Pharmacist

## 2022-07-15 DIAGNOSIS — H353221 Exudative age-related macular degeneration, left eye, with active choroidal neovascularization: Secondary | ICD-10-CM | POA: Diagnosis not present

## 2022-07-15 DIAGNOSIS — I5022 Chronic systolic (congestive) heart failure: Secondary | ICD-10-CM

## 2022-07-15 DIAGNOSIS — E1121 Type 2 diabetes mellitus with diabetic nephropathy: Secondary | ICD-10-CM

## 2022-07-15 NOTE — Chronic Care Management (AMB) (Signed)
Chronic Care Management CCM Pharmacy Note  07/15/2022 Name:  Lucas Caldwell MRN:  [id number] DOB:  06-Mar-1934   Subjective: Lucas Caldwell is an 89 y.o. year old male who is a primary patient of Smitty Cords, DO.  The CCM team was consulted for assistance with disease management and care coordination needs.    Engaged with patient and wife by telephone for follow up visit for pharmacy case management and/or care coordination services.   Objective:  Medications Reviewed Today     Reviewed by Randa Lynn, CMA (Certified Medical Assistant) on 07/09/22 at 1550  Med List Status: <None>   Medication Order Taking? Sig Documenting Provider Last Dose Status Informant  ABRYSVO 120 MCG/0.5ML injection [id number] Yes  [provider] Taking Active   acetaminophen (TYLENOL) 500 MG tablet [id number] Yes Take 1,000 mg by mouth 2 (two) times daily as needed for moderate pain or headache. [provider] Taking Active Spouse/Significant Other  Alcohol Swabs (B-D SINGLE USE SWABS REGULAR) PADS [id number] Yes Use to check blood sugar up to 2 times daily Smitty Cords, DO Taking Active Spouse/Significant Other  allopurinol (ZYLOPRIM) 100 MG tablet [id number] Yes Take 1 tablet (100 mg total) by mouth daily. Creig Hines, NP Taking Active   aspirin 81 MG chewable tablet [id number] Yes Chew 81 mg by mouth daily. [provider] Taking Active Spouse/Significant Other  Blood Glucose Calibration (OT ULTRA/FASTTK CNTRL SOLN) Criss Rosales [id number] Yes  [provider] Taking Active   Blood Glucose Monitoring Suppl (ONE TOUCH ULTRA 2) w/Device KIT [id number] Yes SMARTSIG:Via Meter [provider] Taking Active   bosutinib (BOSULIF) 100 MG tablet [id number] Yes Take 100 mg by mouth at bedtime. Take with food. [provider] Taking Active Spouse/Significant Other           Med Note Hetty Ely Sep 15, 2019   8:38 AM)    Cholecalciferol 25 MCG (1000 UT) capsule [id number] Yes Take 1,000 Units by mouth daily. [provider] Taking Active Spouse/Significant Other  Furosemide (FUROSCIX) 80 MG/10ML CTKT [id number] Yes Inject 80 mg into the skin daily as needed. Bensimhon, Bevelyn Buckles, MD Taking Active   gabapentin (NEURONTIN) 600 MG tablet [id number] Yes Take 600 mg by mouth 2 (two) times daily. Smitty Cords, DO Taking Active   glucose blood [facility] VERIO) test strip [id number] Yes USE TO TEST BLOOD SUGAR EVERY MORNING AND EVERY NIGHT AT BEDTIME Smitty Cords, DO Taking Active   Insulin Glargine ([name] KWIKPEN) 100 UNIT/ML [id number] Yes Inject 22 Units into the skin daily. Smitty Cords, DO Taking Active   Insulin Pen Needle 31G X 5 MM MISC [id number] Yes Use to inject insulin daily. Smitty Cords, DO Taking Active   isosorbide mononitrate (IMDUR) 60 MG 24 hr tablet [id number] Yes Take 0.5 tablets (30 mg total) by mouth daily. Yvonne Kendall, MD Taking Active   JARDIANCE 10 MG TABS tablet [id number] Yes TAKE 1 TABLET(10 MG) BY MOUTH DAILY WITH BREAKFAST [name], [name], DO Taking Active   Lancets [facility] DELICA PLUS [place]) MISC [id number] Yes Use to check blood sugar twice daily Smitty Cords, DO Taking Active   levothyroxine (SYNTHROID) 25 MCG tablet [id number] Yes TAKE 1 TABLET BY MOUTH EVERY MORNING BEFORE BREAKFAST Karamalegos, [name], DO Taking Active   losartan (COZAAR) 25 MG tablet [id number] Yes TAKE 1/2 TABLET(12.5 MG) BY MOUTH DAILY End, [name], MD Taking Active   Menthol-Zinc Oxide (GOLD [place]) [id number] Yes  Apply 1 application topically daily as needed (muscle pain). [provider] Taking Active Spouse/Significant Other  metolazone (ZAROXOLYN) 2.5 MG tablet [id number] Yes Take 1 tablet (2.5 mg total) by mouth once a week. Every Wednesday. Take this medication with potassium Bensimhon, Bevelyn Buckles, MD Taking  Active   mirabegron ER (MYRBETRIQ) 25 MG TB24 tablet [id number] Yes Take 1 tablet (25 mg total) by mouth daily. Carman Ching, PA-C Taking Active   Multiple Vitamin (MULTIVITAMIN WITH MINERALS) TABS tablet [id number] Yes Take 1 tablet by mouth daily. [provider] Taking Active Spouse/Significant Other  nitroGLYCERIN (NITROSTAT) 0.4 MG SL tablet [id number] Yes For chest pain, tightness, or pressure. While sitting, place 1 tablet under tongue. May be used every 5 minutes as needed, for up to 15 minutes. Do not use more than 3 tablets. Yvonne Kendall, MD Taking Active   Polyethyl Glycol-Propyl Glycol [facility] OP) [id number] Yes Place 1 drop into both eyes 3 (three) times daily as needed (dry/irritated eyes.).  [provider] Taking Active Spouse/Significant Other  polyethylene glycol (MIRALAX / GLYCOLAX) 17 g packet [id number] Yes Take 17 g by mouth daily as needed for mild constipation.  [provider] Taking Active Spouse/Significant Other  potassium chloride SA (KLOR-CON M) 20 MEQ tablet [id number] Yes TAKE 2 TABLETS BY MOUTH ONCE A WEEK WITH METOLAZONE EVERY WEDNESDAY  Patient taking differently: Taking one potassium tablet every other day   Bensimhon, Bevelyn Buckles, MD Taking Active   ranolazine (RANEXA) 500 MG 12 hr tablet [id number] Yes Take 1 tablet (500 mg total) by mouth 2 (two) times daily. End, [name], MD Taking Active   rosuvastatin (CRESTOR) 5 MG tablet [id number] Yes TAKE 1 TABLET(5 MG) BY MOUTH DAILY End, [name], MD Taking Active   tamsulosin (FLOMAX) 0.4 MG CAPS capsule [id number] Yes Take 1 capsule (0.4 mg total) by mouth daily after supper. Sondra Barges, PA-C Taking Active Spouse/Significant Other  torsemide (DEMADEX) 20 MG tablet [id number] Yes TAKE 4 TABLETS(80 MG) BY MOUTH TWICE DAILY Bensimhon, Bevelyn Buckles, MD Taking Active   traMADol (ULTRAM) 50 MG tablet [id number] Yes Take 1-2 tablets (50-100 mg total) by mouth at bedtime as needed. Smitty Cords, DO Taking Active Spouse/Significant Other  warfarin (COUMADIN) 3 MG tablet [id number] Yes TAKE 1 TO 1 AND 1/2 TABLET BY MOUTH ONCE A DAY AS DIRECTED BY COUMADIN CLINIC End, [name], MD Taking Active             Pertinent Labs:  Lab Results  Component Value Date   HGBA1C 7.5 (A) 06/05/2022   Lab Results  Component Value Date   CHOL 156 12/05/2021   HDL 33 (L) 12/05/2021   LDLCALC 92 12/05/2021   TRIG 211 (H) 12/05/2021   CHOLHDL 4.7 12/05/2021   Lab Results  Component Value Date   CREATININE 1.81 (H) 07/08/2022   BUN 28 (H) 07/08/2022   NA 140 07/08/2022   K 4.5 07/08/2022   CL 104 07/08/2022   CO2 25 07/08/2022   BP Readings from Last 3 Encounters:  07/09/22 132/82  07/08/22 107/73  06/05/22 134/86   Pulse Readings from Last 3 Encounters:  07/09/22 (!) 114  07/08/22 92  06/05/22 86    SDOH:  (Social Determinants of Health) assessments and interventions performed:  SDOH Interventions    Flowsheet Row Chronic Care Management from 06/10/2022 in [facility] Chronic Care Management from 01/28/2022 in [facility] Coordination from 11/05/2021 in [facility]  Coordination Office Visit from 10/05/2021 in [facility] Clinical Support from 09/03/2021 in [facility] Chronic Care Management from 10/02/2020 in [facility]  SDOH Interventions        Food Insecurity Interventions Intervention Not Indicated -- Intervention Not Indicated -- Intervention Not Indicated --  Housing Interventions Intervention Not Indicated -- Intervention Not Indicated -- Intervention Not Indicated --  Transportation Interventions Intervention Not Indicated -- Intervention Not Indicated -- Intervention Not Indicated --  Utilities Interventions Intervention Not Indicated Intervention Not Indicated -- -- -- --  Alcohol Usage  Interventions Intervention Not Indicated (Score <7) -- -- -- -- --  Depression Interventions/Treatment  -- -- -- Counseling -- --  Financial Strain Interventions Intervention Not Indicated -- Intervention Not Indicated -- Intervention Not Indicated --  Physical Activity Interventions Other (Comments)  [encouraged activity, gets easily shob with activity] -- Other (Comments)  [gets really short of breath due to chronic conditions] -- -- Other (Comments)  [gets easily short of breath. Limited to the activity he can do]  Stress Interventions Intervention Not Indicated -- -- -- Intervention Not Indicated --  Social Connections Interventions Intervention Not Indicated -- -- -- Intervention Not Indicated --       CCM Care Plan  Review of patient past medical history, allergies, medications, health status, including review of consultants reports, laboratory and other test data, was performed as part of comprehensive evaluation and provision of chronic care management services.   Care Plan : PharmD - Medication Management/Assistance  Updates made by Manuela Neptune, RPH-CPP since 07/15/2022 12:00 AM     Problem: Disease Progression      Long-Range Goal: Disease Progression Prevented or Minimized   Start Date: 04/14/2020  Expected End Date: 07/13/2020  Recent Progress: On track  Priority: High  Note:   Current Barriers:  Chronic Disease Management support, education, and care coordination needs related to type 2 diabetes, CKD, HFrEF s/p CardioMEMS, hyperlipidemia, atrial fibrillation, hypertension, coronary artery disease, CHF, hypothyroidism, and CML Financial Note patient has Partial Extra Help/LIS Subsidy Patient enrolled in [place] patient assistance program for 2024 calendar year Patient denied for patient assistance for [place] from [facility] for 2022 calendar year Patient previously enrolled in [employer] [facility] Heart Failure fund for [employer] copayment assistance, but  eligibility ended as not in use ($0 copayment through Part D plan coverage for Jardiance) Limited vision  Pharmacist Clinical Goal(s):  Over the next 90 days, patient will verbalize ability to afford treatment regimen through collaboration with PharmD and provider.   Interventions: 1:1 collaboration with Smitty Cords, DO regarding development and update of comprehensive plan of care as evidenced by provider attestation and co-signature Inter-disciplinary care team collaboration (see longitudinal plan of care) Perform chart review.  Per encounter from Office Visit with [facility] on 4/22, provider advised patient to use Furoscix x 2 doses now and reassess next week Patient seen for Office Visit with [facility] on 4/23. Per Office Visit note, provider planned to have patient switch from Myrbetriq to [place] (samples provided). However, following appointment, Office contacted patient to advise patient to stop Myrbetriq, but hold off on starting [place]. First patient to return to clinic for repeat PVR. Scheduled for 5/2  Today patient's wife confirms patient completed 2 days of Furoscix injections as directed by Cardiologist. Remind patient/wife of patient's follow up Office Visit with Heart Failure specialist this afternoon. [name] locates appointment information and  confirms patient will attend Confirms patient holding off on taking either Myrbetriq of Gemtesa as recommended by Urologist until after upcoming appointment with this provider on 5/2   Type 2 Diabetes: Current treatment: [name] 22 units daily Jardiance 10 mg once daily in morning Previous therapies tried: Trulicity (stopped due to concern could be contributing to diarrhea) Reports recent fasting blood sugars readings:      Morning Fasting Blood Sugar  24 - April 167  25 - April 124  26 - April 126  27 - April 146  28 - April 159  29 - April -  Counseled on importance  of having regular well-balanced meals and snacks, while controlling carbohydrate portion sizes Confirms no longer drinking Pepsi or sweet tea and avoiding popsicles for dessert Encourage patient to continue to avoid sugary beverages/snacks Encourage patient/caregiver to review nutrition labels for total carbohydrate content of foods Denies hypoglycemia Have counseled on signs of hypoglycemia and how to manage low blood sugar Exercise: walks with walker/cane ~10 minutes most days of the week Have discussed importance of fall prevention/consistently using walker Counsel to continue to monitor home blood sugar, keep a log of results, bring this record to medical appointments and call office sooner if needed for readings outside of established parameters or symptoms Statin: rosuvastatin 5 mg daily Patient/caregiver to follow up with [facility] [phone]) when patient in need of further diabetes testing supplies Counsel to continue to monitor home blood sugar, keep log of results, have this record to review during upcoming appointments and to call office sooner if needed for readings outside of established parameters or for new symptoms    Patient Goals/Self-Care Activities Over the next 90 days, patient will:  Check blood sugar, blood pressure and fluid status as directed by providers  Take medications, with assistance of wife, as directed Mrs. [place] manages patient's medications using weekly pillbox as adherence tool Attends scheduled medical appointments        Plan: Telephone follow up appointment with care management team member scheduled for:  08/19/2022 at 10:00 AM  Estelle Grumbles, PharmD, Patsy [place], CPP Clinical Pharmacist [facility] [phone]

## 2022-07-15 NOTE — Patient Instructions (Signed)
Visit Information  Thank you for taking time to visit with me today. Please don't hesitate to contact me if I can be of assistance to you before our next scheduled telephone appointment.  Following are the goals we discussed today:   Goals Addressed             This Visit's Progress    Pharmacy Goals       Please check your blood sugar, keep a log of the results and bring this with you to your medical appointments.  Please check your home blood pressure, keep a log of the results and bring this with you to your medical appointments.  Our goal bad cholesterol, or LDL, is less than 70 . This is why it is important to continue taking your rosuvastatin.  Feel free to call me with any questions or concerns. I look forward to our next call!  Estelle Grumbles, PharmD, Patsy [place], CPP Clinical Pharmacist [facility] [phone]          Our next appointment is by telephone on 08/19/2022 at 10:00 AM  Please call the care guide team at [phone] if you need to cancel or reschedule your appointment.    The patient verbalized understanding of instructions, educational materials, and care plan provided today and DECLINED offer to receive copy of patient instructions, educational materials, and care plan.

## 2022-07-16 DIAGNOSIS — E1159 Type 2 diabetes mellitus with other circulatory complications: Secondary | ICD-10-CM

## 2022-07-16 DIAGNOSIS — I5020 Unspecified systolic (congestive) heart failure: Secondary | ICD-10-CM

## 2022-07-16 DIAGNOSIS — Z794 Long term (current) use of insulin: Secondary | ICD-10-CM

## 2022-07-17 ENCOUNTER — Ambulatory Visit (HOSPITAL_BASED_OUTPATIENT_CLINIC_OR_DEPARTMENT_OTHER): Payer: Medicare HMO | Admitting: Cardiology

## 2022-07-17 ENCOUNTER — Encounter: Payer: Self-pay | Admitting: Cardiology

## 2022-07-17 ENCOUNTER — Other Ambulatory Visit
Admission: RE | Admit: 2022-07-17 | Discharge: 2022-07-17 | Disposition: A | Discharge status: Home / Self Care | Payer: Medicare HMO | Source: Ambulatory Visit | Attending: Cardiology | Admitting: Cardiology

## 2022-07-17 VITALS — BP 128/78 | HR 92 | Wt 195.2 lb

## 2022-07-17 DIAGNOSIS — N183 Chronic kidney disease, stage 3 unspecified: Secondary | ICD-10-CM

## 2022-07-17 DIAGNOSIS — Z0100 Encounter for examination of eyes and vision without abnormal findings: Secondary | ICD-10-CM | POA: Diagnosis not present

## 2022-07-17 DIAGNOSIS — I2510 Atherosclerotic heart disease of native coronary artery without angina pectoris: Secondary | ICD-10-CM

## 2022-07-17 DIAGNOSIS — I5022 Chronic systolic (congestive) heart failure: Secondary | ICD-10-CM

## 2022-07-17 LAB — BRAIN NATRIURETIC PEPTIDE: B Natriuretic Peptide: 509.1 pg/mL — ABNORMAL HIGH (ref 0.0–100.0)

## 2022-07-17 LAB — BASIC METABOLIC PANEL
Anion gap: 12 (ref 5–15)
BUN: 34 mg/dL — ABNORMAL HIGH (ref 8–23)
CO2: 25 mmol/L (ref 22–32)
Calcium: 9.4 mg/dL (ref 8.9–10.3)
Chloride: 100 mmol/L (ref 98–111)
Creatinine, Ser: 1.96 mg/dL — ABNORMAL HIGH (ref 0.61–1.24)
GFR, Estimated: 33 mL/min — ABNORMAL LOW (ref >60)
Glucose, Bld: 234 mg/dL — ABNORMAL HIGH (ref 70–99)
Potassium: 4.3 mmol/L (ref 3.5–5.1)
Sodium: 137 mmol/L (ref 135–145)

## 2022-07-17 NOTE — Patient Instructions (Signed)
Please use Furoscix for 3 days only.   Routine lab work today. Will notify you of abnormal results  Follow up in 1 week  Do the following things EVERYDAY: Weigh yourself in the morning before breakfast. Write it down and keep it in a log. Take your medicines as prescribed Eat low salt foods--Limit salt (sodium) to 2000 mg per day.  Stay as active as you can everyday Limit all fluids for the day to less than 2 liters

## 2022-07-17 NOTE — Progress Notes (Signed)
Advanced Heart Failure Clinic Note   Date:  07/17/2022   ID:  ERIN UECKER, DOB [birth year], MRN [id number]  Location: Home  Provider location: [place] [facility] Type of Visit: Established patient   PCP:  Smitty Cords, DO  Cardiologist:  Yvonne Kendall, MD HF Cardiologist: Dr. Gala Romney  Chief Complaint:  No chief complaint on file.  Heart failure follow up   HPI: Lucas Caldwell is a 89 y.o. 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 Okey Dupre for further evaluation of his HF.     Has h/o NICM previously followed by Dr. [name] at [facility]. He had good response to medical therapy in 2012 EF was reported to be 50%    Had PCI of his RCA CAD s/p RCA stent x2 08/2016 at [facility].    Echo 6/20 LVEF 35-40%   Cardiac cath 08/2018 70% LM stenosis with abnl FFR of 0.86, though drop in iFR was due to diffuse prox LAD disease and only small gradient involving ostial LM. Imdur was increased.    Admitted 04/13/19 with chest pain and A/C systolic heart failure. Repeat Cath 1/21 LM 60% LAD 70% with +FFR otherwise diffuse non-obstructive CAD -> PCI/DES LAD 1/21. RHC: RA 10 PA 53/30 (40) PCWP 28 CO/CI 4.2/2.1 PA sat 67%.   Echo 04/22/19 EF 30-35%   Referred to [facility] 04/2019 for further management of his systolic HF. Had cardiomems implanted in 7/22   Zio 4/21 SR w/ 1st AVB, avg HR 81 bpm, 1 brief 5 beat run of NSVT, Two runs SVT   Echo 8/22 EF 30-35% RV ok   Echo 1/23 EF 30-35%  Cardiomems was running high last week and torsemide increased to 80 bid. PAD on cardiomems 27 ->-> 15 ->-> 21-> 23 (on 11/7)  Labs on 11/3 Scr 1.73 -> 1.99  Previously torsemide dropped back to 60 daily (? bid) and metolazone added every Wednesday. Did not tolerate and developed AKI. Saw Ward Givens and torsemide increased from 60 bid to  80/60 for elevated CardioMems (20)    Seen in ED 02/24/22 with AMS and found to have flu A  Cardiomems PAD 33 (goal 19) -> 35 today (07/17/22)  Today Lucas Caldwell presents for a 1 week follow up. He was seen last week due to worsening dyspnea, significant decrease in exercise capacity and anxiety. Over the past week, he has been diuresed with Furoscix x 2 days followed by torsemide 80mg  BID. He reports mild improvements in functional status.  Unfortunately he continues to feel very short of breath with orthopnea and PND.  [name] unable to give me much information regarding his symptoms though.  I have asked him several times if he has chest pain or if symptoms are worse with exertion but he is not sure.  He feels that over the past 3 months his functional status has continued to decline and he is now not as ambulatory or active due to persistent fatigue and weakness.  No lightheadedness, falls or recent illness.   Past Medical History:  Diagnosis Date   Arthritis    "probably in his legs" (08/15/2016)   Blind right eye    BPH (benign prostatic hyperplasia)    Breast asymmetry    Left breast is larger, present for several years.   CAD (coronary artery disease)  a. Cath in the late 90's - reportedly ok;  b. 2014 s/p stenting x 2 @ [facility]; c 08/19/16 Cath/PCI with DES -> RCA, plan to treat LM 70% medically. Seen by surgery and felt to be too high risk for CABG; d. 08/2018 Cath: LM 70 (iFR 0.86), LAD 20ost, 40p, 50/22m, D1 70, LCX patent stent, OM1 30, RCA patent stent, 15m ISR, 40d, RPDA 30. PCWP 8. CO/CI 4.0/2.1; e. 03/2019 PCI to LAD (2.5x15 Resolute Onyx DES).   Chronic combined systolic (congestive) and diastolic (congestive) heart failure (HCC)    a. Previously reduced EF-->50% by echo in 2012;  b. 06/2015 Echo: EF 50-55%  c. 07/2016 Echo: EF 45-50%; d. 12/2017 Echo: EF 55%; e. 08/2018 Echo: EF 35-40%; f. 04/2019 Echo: EF 30-35%; g. s/p cardiomems; h. 10/2020 Echo: EF 30-35%; i. 01/2022 Echo: EF 30-35%, glob  HK, GrII DD, nl rV fxn, mild to mod MR.   Chronic kidney disease (CKD), stage IV (severe) (HCC)    CML (chronic myelocytic leukemia) (HCC)    GERD (gastroesophageal reflux disease)    GIB (gastrointestinal bleeding)    a. 12/2017 3 unit PRBC GIB in setting of coumadin-->Endo/colon multiple duodenal ulcers and a single bleeding ulcer in the proximal ascending colon status post hemostatic clipping x2.   Gout    Hyperlipemia    Hypertension    Hypothyroidism    Iron deficiency anemia    Ischemic cardiomyopathy    a. Previously reduced EF-->50% by echo in 2012;  b. 06/2015 Echo: EF 50-55%, Gr2 DD;  c. 07/2016 Echo: EF 45-50%; d. 12/2017 Echo: EF 55%, Gr1 DD, mild MR; e. 08/2018 Echo: EF 35-40%; f. 04/2019 Echo: EF 30-35%; g. 10/2020 Echo: EF 30-35%; h. Echo: EF 30-35%   Migraine    "in the 1960s" (08/15/2016)   Obstructive sleep apnea    Orthostatic hypotension    a. 20222 -->carvedilol and hydralazine d/c'd.   PAF (paroxysmal atrial fibrillation) (HCC)    a. ? Dx 2014-->s/p DCCV;  b. CHA2DS2VASc = 6--> Coumadin.   Prostate cancer (HCC)    RBBB    Type II diabetes mellitus [facility])    Past Surgical History:  Procedure Laterality Date   AMPUTATION TOE Right 02/23/2021   Procedure: AMPUTATION TOE;  Surgeon: Gwyneth Revels, DPM;  Location: [facility] ORS;  Service: Podiatry;  Laterality: Right;   CATARACT EXTRACTION W/ INTRAOCULAR LENS  IMPLANT, BILATERAL Bilateral    COLONOSCOPY N/A 01/13/2018   Procedure: COLONOSCOPY;  Surgeon: Toney Reil, MD;  Location: [facility] ENDOSCOPY;  Service: Gastroenterology;  Laterality: N/A;   COLONOSCOPY WITH PROPOFOL N/A 01/01/2018   Procedure: COLONOSCOPY WITH PROPOFOL;  Surgeon: Toney Reil, MD;  Location: [facility] ENDOSCOPY;  Service: Gastroenterology;  Laterality: N/A;   CORONARY ANGIOPLASTY WITH STENT PLACEMENT  09/2012   2 stents   CORONARY STENT INTERVENTION N/A 08/19/2016   Procedure: Coronary Stent Intervention;  Surgeon: Yvonne Kendall, MD;  [facility];  Service: Cardiovascular;  Laterality: N/A;   CORONARY STENT INTERVENTION N/A 04/13/2019   Procedure: CORONARY STENT INTERVENTION;  Surgeon: Yvonne Kendall, MD;  Location: [facility] INVASIVE CV LAB;  Service: Cardiovascular;  Laterality: N/A;   ESOPHAGEAL MANOMETRY N/A 12/16/2017   Procedure: ESOPHAGEAL MANOMETRY (EM);  Surgeon: Toney Reil, MD;  Location: [facility] ENDOSCOPY;  Service: Gastroenterology;  Laterality: N/A;   ESOPHAGOGASTRODUODENOSCOPY N/A 12/20/2016   Procedure: ESOPHAGOGASTRODUODENOSCOPY (EGD);  Surgeon: Toney Reil, MD;  Location: [facility] ENDOSCOPY;  Service: Gastroenterology;  Laterality: N/A;   ESOPHAGOGASTRODUODENOSCOPY N/A 01/13/2018  Procedure: ESOPHAGOGASTRODUODENOSCOPY (EGD);  Surgeon: Toney Reil, MD;  Location: [facility] ENDOSCOPY;  Service: Gastroenterology;  Laterality: N/A;   ESOPHAGOGASTRODUODENOSCOPY (EGD) WITH PROPOFOL N/A 11/03/2017   Procedure: ESOPHAGOGASTRODUODENOSCOPY (EGD) WITH PROPOFOL;  Surgeon: Toney Reil, MD;  Location: [facility] ENDOSCOPY;  Service: Gastroenterology;  Laterality: N/A;   EYE SURGERY     HERNIA REPAIR     "navel"   LEFT HEART CATH AND CORONARY ANGIOGRAPHY N/A 08/14/2016   Procedure: Left Heart Cath and Coronary Angiography;  Surgeon: Yvonne Kendall, MD;  Location: [facility] INVASIVE CV LAB;  Service: Cardiovascular;  Laterality: N/A;   PRESSURE SENSOR/CARDIOMEMS N/A 09/23/2019   Procedure: PRESSURE SENSOR/CARDIOMEMS;  Surgeon: Dolores Patty, MD;  Location: [facility] INVAS[facility] CV LAB;  Service: Cardiovascular;  Laterality: N/A;   RIGHT HEART CATH N/A 09/23/2019   Procedure: RIGHT HEART CATH;  Surgeon: Dolores Patty, MD;  Location: [facility] CV LAB;  Service: Cardiovascular;  Laterality: N/A;   RIGHT/LEFT HEART CATH AND CORONARY ANGIOGRAPHY N/A 09/01/2018   Procedure: RIGHT/LEFT HEART CATH AND CORONARY ANGIOGRAPHY;  Surgeon: Yvonne Kendall, MD;  Location: [facility] INVASIVE CV LAB;  Service: Cardiovascular;   Laterality: N/A;   RIGHT/LEFT HEART CATH AND CORONARY ANGIOGRAPHY N/A 04/13/2019   Procedure: RIGHT/LEFT HEART CATH AND CORONARY ANGIOGRAPHY;  Surgeon: Yvonne Kendall, MD;  Location: [facility] INVASIVE CV LAB;  Service: Cardiovascular;  Laterality: N/A;   TOE AMPUTATION Right    "big toe"   Current Outpatient Medications  Medication Sig Dispense Refill   ABRYSVO 120 MCG/0.5ML injection      acetaminophen (TYLENOL) 500 MG tablet Take 1,000 mg by mouth 2 (two) times daily as needed for moderate pain or headache.     Alcohol Swabs (B-D SINGLE USE SWABS REGULAR) PADS Use to check blood sugar up to 2 times daily 200 each 3   allopurinol (ZYLOPRIM) 100 MG tablet Take 1 tablet (100 mg total) by mouth daily. 90 tablet 3   aspirin 81 MG chewable tablet Chew 81 mg by mouth daily.     Blood Glucose Calibration (OT ULTRA/FASTTK CNTRL SOLN) SOLN      Blood Glucose Monitoring Suppl (ONE TOUCH ULTRA 2) w/Device KIT SMARTSIG:Via Meter     bosutinib (BOSULIF) 100 MG tablet Take 100 mg by mouth at bedtime. Take with food.     Cholecalciferol 25 MCG (1000 UT) capsule Take 1,000 Units by mouth daily.     Furosemide (FUROSCIX) 80 MG/10ML CTKT Inject 80 mg into the skin daily as needed.     gabapentin (NEURONTIN) 600 MG tablet Take 600 mg by mouth 2 (two) times daily. 270 tablet 1   glucose blood (ONETOUCH VERIO) test strip USE TO TEST BLOOD SUGAR EVERY MORNING AND EVERY NIGHT AT BEDTIME 100 strip 0   Insulin Glargine ([name] KWIKPEN) 100 UNIT/ML Inject 22 Units into the skin daily. 6 mL 0   Insulin Pen Needle 31G X 5 MM MISC Use to inject insulin daily. 100 each 3   isosorbide mononitrate (IMDUR) 60 MG 24 hr tablet Take 0.5 tablets (30 mg total) by mouth daily. 45 tablet 2   JARDIANCE 10 MG TABS tablet TAKE 1 TABLET(10 MG) BY MOUTH DAILY WITH BREAKFAST 90 tablet 1   Lancets (ONETOUCH DELICA PLUS LANCET30G) MISC Use to check blood sugar twice daily 200 each 3   levothyroxine (SYNTHROID) 25 MCG tablet TAKE 1 TABLET  BY MOUTH EVERY MORNING BEFORE BREAKFAST 90 tablet 2   losartan (COZAAR) 25 MG tablet TAKE 1/2 TABLET(12.5 MG) BY MOUTH DAILY 45 tablet  10   Menthol-Zinc Oxide (GOLD BOND EX) Apply 1 application topically daily as needed (muscle pain).     metolazone (ZAROXOLYN) 2.5 MG tablet Take 1 tablet (2.5 mg total) by mouth once a week. Every Wednesday. Take this medication with potassium 10 tablet 3   Multiple Vitamin (MULTIVITAMIN WITH MINERALS) TABS tablet Take 1 tablet by mouth daily.     nitroGLYCERIN (NITROSTAT) 0.4 MG SL tablet For chest pain, tightness, or pressure. While sitting, place 1 tablet under tongue. May be used every 5 minutes as needed, for up to 15 minutes. Do not use more than 3 tablets. 25 tablet 1   Polyethyl Glycol-Propyl Glycol (SYSTANE OP) Place 1 drop into both eyes 3 (three) times daily as needed (dry/irritated eyes.).      polyethylene glycol (MIRALAX / GLYCOLAX) 17 g packet Take 17 g by mouth daily as needed for mild constipation.      potassium chloride SA (KLOR-CON M) 20 MEQ tablet TAKE 2 TABLETS BY MOUTH ONCE A WEEK WITH METOLAZONE EVERY WEDNESDAY (Patient taking differently: Taking one potassium tablet every other day) 10 tablet 3   ranolazine (RANEXA) 500 MG 12 hr tablet Take 1 tablet (500 mg total) by mouth 2 (two) times daily. 180 tablet 2   rosuvastatin (CRESTOR) 5 MG tablet TAKE 1 TABLET(5 MG) BY MOUTH DAILY 90 tablet 0   tamsulosin (FLOMAX) 0.4 MG CAPS capsule Take 1 capsule (0.4 mg total) by mouth daily after supper. 30 capsule 11   torsemide (DEMADEX) 20 MG tablet TAKE 4 TABLETS(80 MG) BY MOUTH TWICE DAILY 240 tablet 3   traMADol (ULTRAM) 50 MG tablet Take 1-2 tablets (50-100 mg total) by mouth at bedtime as needed. 30 tablet 2   Vibegron (GEMTESA) 75 MG TABS Take 1 tablet (75 mg total) by mouth daily. (Patient not taking: Reported on 07/15/2022) 42 tablet 0   warfarin (COUMADIN) 3 MG tablet TAKE 1 TO 1 AND 1/2 TABLET BY MOUTH ONCE A DAY AS DIRECTED BY COUMADIN CLINIC 45  tablet 1   No current facility-administered medications for this visit.    Allergies:   Clopidogrel, Ciprofloxacin, Beta adrenergic blockers, Mirtazapine, and Spironolactone   Social History:  The patient  reports that he quit smoking about 49 years ago. His smoking use included cigarettes. He started smoking about 69 years ago. He has a 35.00 pack-year smoking history. He has quit using smokeless tobacco. He reports that he does not drink alcohol and does not use drugs.   Family History:  The patient's family history includes Brain cancer in his father; Diabetes in his sister; Heart attack in his sister.   ROS:  Please see the history of present illness.   All other systems are personally reviewed and negative.   Vitals:   07/17/22 1021  BP: 128/78  Pulse: 92  SpO2: 96%   GENERAL: Well nourished, well developed, and in no apparent distress at rest.  HEENT: Negative for arcus senilis or xanthelasma. There is no scleral icterus.  The mucous membranes are pink and moist.   NECK: Supple, No masses. Normal carotid upstrokes without bruits. No masses or thyromegaly.    CHEST: There are no chest wall deformities. There is no chest wall tenderness. Respirations are unlabored.  Lungs-decreased lung sounds at the bases CARDIAC:  JVP: 10 cm          Normal rate with regular rhythm. No murmurs, rubs or gallops.  Pulses are 2+ and symmetrical in upper and lower extremities.  Minimal pretibial  edema.  ABDOMEN: Soft, non-tender, non-distended. There are no masses or hepatomegaly. There are normal bowel sounds.  EXTREMITIES: Warm and well perfused with no cyanosis, clubbing.  LYMPHATIC: No axillary or supraclavicular lymphadenopathy.  NEUROLOGIC: Patient is oriented x3 with no focal or lateralizing neurologic deficits.  PSYCH: Patients affect is appropriate, there is no evidence of anxiety or depression.  SKIN: Warm and dry; no lesions or wounds.    02/05/2022: TSH 3.204 02/24/2022: ALT 12;  Hemoglobin 13.4; Platelets 197 07/08/2022: B Natriuretic Peptide 503.3; BUN 28; Creatinine, Ser 1.81; Potassium 4.5; Sodium 140   Wt Readings from Last 3 Encounters:  07/09/22 199 lb (90.3 kg)  07/08/22 199 lb (90.3 kg)  06/05/22 201 lb 12.8 oz (91.5 kg)      ASSESSMENT AND PLAN:  1. 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  - Continue losartan 12.5 mg daily (dose decreased due to low BP).  - Continue Jardiance 10 mg daily - Bedside ultrasound today with IVC of 2.5cm; LV function remains reduced at 30%; however, appears more dilated compared to echo from 01/18/22.  -CardioMEMS PA diastolic of 35 mmHg today.  In addition bedside ultrasound as noted above.  Will plan for Furoscix x 3 days followed by torsemide 80 mg twice daily with a 1 week follow-up.  Repeat BMP/BNP today.  If he remains symptomatic plan on right and left heart cath; previously had diffuse coronary artery disease with PCI to the LAD in 2021.  2. CAD - Cath 04/13/19 LAD 70% with +FFR otherwise diffuse non-obstructive CAD  - S/p PCI/DES to LAD 1/21 - Followed by Dr. Okey Dupre - No current angina.  Continue Ranexa per Dr. Okey Dupre.  - Continue medical management.  3. CKD Stage 3b - baseline SCr ~1.7-2.0 -Repeat labs today  4. DM  - Continue Jardiance 10 mg daily. - No GU symptoms  5. PAF - Zio patch 4/21 showed no AF. Mostly NSR w/ brief SVT/ NSVT, PACs and PVCs - Remains in NSR on ECG today - Continue warfarin - No bleeding   Dorthula Nettles, DO  9:59 AM   [facility] [address] [facility] [place] [address] [phone] (office) [phone]  (fax)

## 2022-07-18 ENCOUNTER — Ambulatory Visit: Payer: Medicare HMO | Admitting: Physician Assistant

## 2022-07-19 ENCOUNTER — Ambulatory Visit: Payer: Medicare HMO | Admitting: Physician Assistant

## 2022-07-22 ENCOUNTER — Ambulatory Visit: Payer: Self-pay

## 2022-07-22 ENCOUNTER — Ambulatory Visit (INDEPENDENT_AMBULATORY_CARE_PROVIDER_SITE_OTHER): Payer: Medicare HMO | Admitting: Family Medicine

## 2022-07-22 ENCOUNTER — Telehealth: Payer: Medicare HMO

## 2022-07-22 ENCOUNTER — Encounter: Payer: Self-pay | Admitting: Family Medicine

## 2022-07-22 ENCOUNTER — Ambulatory Visit (INDEPENDENT_AMBULATORY_CARE_PROVIDER_SITE_OTHER): Payer: Medicare HMO

## 2022-07-22 VITALS — BP 128/70 | HR 50 | Temp 98.2°F | Ht 65.0 in | Wt 196.0 lb

## 2022-07-22 DIAGNOSIS — E1121 Type 2 diabetes mellitus with diabetic nephropathy: Secondary | ICD-10-CM

## 2022-07-22 DIAGNOSIS — F05 Delirium due to known physiological condition: Secondary | ICD-10-CM

## 2022-07-22 DIAGNOSIS — I10 Essential (primary) hypertension: Secondary | ICD-10-CM

## 2022-07-22 DIAGNOSIS — I5032 Chronic diastolic (congestive) heart failure: Secondary | ICD-10-CM

## 2022-07-22 DIAGNOSIS — I5022 Chronic systolic (congestive) heart failure: Secondary | ICD-10-CM

## 2022-07-22 DIAGNOSIS — R410 Disorientation, unspecified: Secondary | ICD-10-CM

## 2022-07-22 LAB — POCT URINALYSIS DIPSTICK
Bilirubin, UA: NEGATIVE
Blood, UA: NEGATIVE
Glucose, UA: POSITIVE — AB
Ketones, UA: POSITIVE
Leukocytes, UA: NEGATIVE
Nitrite, UA: NEGATIVE
Protein, UA: NEGATIVE
Spec Grav, UA: 1.01 (ref 1.010–1.025)
Urobilinogen, UA: 0.2 E.U./dL
pH, UA: 6.5 (ref 5.0–8.0)

## 2022-07-22 NOTE — Progress Notes (Signed)
Subjective:    Patient ID: Lucas Caldwell, male    DOB: [birth year], 89 y.o.   MRN: [id number]  Lucas Caldwell is a 89 y.o. male presenting on 07/22/2022 for Altered Mental Status   HPI  Confusion vs Sundowning Issue with dreams/hallucination reported when he is waking up or dozing off more common. Today also admits sinus congestion  He has upcoming Cardiology Weds. Recently seen as well. Monitoring his fluid. He has not had significant change in swelling or fluid.  Admits worse symptoms in Night time with sundowning type symptoms  Also admits arthritis bothering him with Stiffness when prolonged sitting Using cane Chronic problem Neuropathy in feet  Has not been back to Neurology, seen In 2023. See scan below      06/05/2022    7:00 PM 11/05/2021    9:24 AM 10/05/2021    3:12 PM  Depression screen PHQ 2/9  Decreased Interest 0 0 0  Down, Depressed, Hopeless 0 0 0  PHQ - 2 Score 0 0 0  Altered sleeping   0  Tired, decreased energy   1  Change in appetite   1  Feeling bad or failure about yourself    0  Trouble concentrating   0  Moving slowly or fidgety/restless   0  Suicidal thoughts   0  PHQ-9 Score   2  Difficult doing work/chores   Not difficult at all    Social History   Tobacco Use   Smoking status: Former    Packs/day: 1.75    Years: 20.00    Additional pack years: 0.00    Total pack years: 35.00    Types: Cigarettes    Start date: 66    Quit date: 1975    Years since quitting: 49.3   Smokeless tobacco: Former  Building services engineer Use: Never used  Substance Use Topics   Alcohol use: No    Comment: previously drank heavily - quit 1979.   Drug use: No    Review of Systems Per HPI unless specifically indicated above     Objective:    BP 128/70 (BP Location: Left Arm, Patient Position: Sitting, Cuff Size: Large)   Pulse (!) 50   Temp 98.2 F (36.8 C) (Oral)   Ht 5\' 5"  (1.651 m)   Wt 196 lb (88.9 kg)   SpO2 98%   BMI 32.62 kg/m    Wt Readings from Last 3 Encounters:  07/22/22 196 lb (88.9 kg)  07/17/22 195 lb 4 oz (88.6 kg)  07/09/22 199 lb (90.3 kg)    Physical Exam Vitals and nursing note reviewed.  Constitutional:      General: [name] not in acute distress.    Appearance: [name] well-developed. [name] not diaphoretic.     Comments: Well-appearing, comfortable, cooperative  HENT:     Head: Normocephalic and atraumatic.  Eyes:     General:        Right eye: No discharge.        Left eye: No discharge.     Conjunctiva/sclera: Conjunctivae normal.  Neck:     Thyroid: No thyromegaly.  Cardiovascular:     Rate and Rhythm: Normal rate and regular rhythm.     Pulses: Normal pulses.     Heart sounds: Normal heart sounds. No murmur heard. Pulmonary:     Effort: Pulmonary effort is normal. No respiratory distress.     Breath sounds: Normal breath sounds. No wheezing or rales.  Musculoskeletal:        General: Normal range of motion.     Cervical back: Normal range of motion and neck supple.  Lymphadenopathy:     Cervical: No cervical adenopathy.  Skin:    General: Skin is warm and dry.     Findings: No erythema or rash.  Neurological:     Mental Status: [name] alert and oriented to person, place, and time. Mental status is at baseline.  Psychiatric:        Behavior: Behavior normal.     Comments: Well groomed, good eye contact, normal speech and thoughts     I have personally reviewed the radiology report from 02/24/22 on CT Head.  CLINICAL DATA:  Altered mental status. Awoke with weakness.   EXAM: CT HEAD WITHOUT CONTRAST   TECHNIQUE: Contiguous axial images were obtained from the base of the skull through the vertex without intravenous contrast.   RADIATION DOSE REDUCTION: This exam was performed according to the departmental dose-optimization program which includes automated exposure control, adjustment of the mA and/or kV according to patient size and/or use of iterative reconstruction  technique.   COMPARISON:  None Available.   FINDINGS: Brain: Generalized cerebral atrophy. Ventricular prominence likely due to central atrophy. No acute hemorrhage, hydrocephalus, midline shift or mass effect. No evidence of acute ischemia. No subdural or extra-axial collection.   Vascular: Atherosclerosis of skullbase vasculature without hyperdense vessel or abnormal calcification.   Skull: No fracture or focal lesion.   Sinuses/Orbits: Minimal opacification of lower left mastoid air cells. Paranasal sinuses are clear. Bilateral cataract resection.   Other: Unremarkable scalp soft tissues.   IMPRESSION: 1. No acute intracranial abnormality. 2. Generalized atrophy and chronic small vessel ischemia.     Electronically Signed   By: Narda Rutherford M.D.   On: 02/25/2022 00:19  Results for orders placed or performed in visit on 07/22/22  POCT urinalysis dipstick  Result Value Ref Range   Color, UA     Clarity, UA     Glucose, UA Positive (A) Negative   Bilirubin, UA negative    Ketones, UA positive    Spec Grav, UA 1.010 1.010 - 1.025   Blood, UA negative    pH, UA 6.5 5.0 - 8.0   Protein, UA Negative Negative   Urobilinogen, UA 0.2 0.2 or 1.0 E.U./dL   Nitrite, UA neg    Leukocytes, UA Negative Negative   Appearance     Odor     *Note: Due to a large number of results and/or encounters for the requested time period, some results have not been displayed. A complete set of results can be found in Results Review.      Assessment & Plan:   Problem List Items Addressed This Visit   None Visit Diagnoses     Confusion    -  Primary   Relevant Orders   POCT urinalysis dipstick (Completed)   Sundowning           Suspected cause of his symptoms are sundowning phenomenon vs sleep/wake setting hallucinations seems if he is taking a nap or dozing off or late in evening are occurrences.  Advice on conservative measures, redirection and management at home. May warrant  additional help in future.  No new medication today  No sign of infection in the urine.  Urinalysis normal. Except Glucose, expected due to Jardiance medication  Lungs are clear today.  Congestion may be allergy or viral but no significant sign of infection.  Weight is stable. No significant weight gain or fluid change  Keep apt on Weds with Cardiology for further eval.  If not improving we can do lab and X-ray of chest.  If severe worsening then seek care at hospital ED if difficulty with confusion.  Last Head scan CT was 02/2022.  If not improving confusion, then I suggest we can pursue repeat scan by Dr Malvin Johns  Dr Malvin Johns - Neurologist  [facility] - Neurology Dept [address] [place], [place] [address] Phone: [phone]  No orders of the defined types were placed in this encounter.     Follow up plan: Return if symptoms worsen or fail to improve.    [name], DO [facility] 07/22/2022, 4:16 PM

## 2022-07-22 NOTE — Patient Instructions (Addendum)
Thank you for coming to the office today.  No sign of infection in the urine.  Lungs are clear today.  Congestion may be allergy or viral but no significant sign of infection.  Weight is stable. No significant weight gain or fluid change  Keep apt on Weds with Cardiology for further eval.  If not improving we can do lab and X-ray of chest.  If severe worsening then seek care at hospital ED if difficulty with confusion.  Last Head scan CT was 02/2022.  If not improving confusion, then I suggest we can pursue repeat scan by Dr Malvin Johns  Dr Malvin Johns - Neurologist  [facility] - Neurology Dept [address] [place], [place] [address] Phone: [phone]   -------------  The term "sundowning" refers to a state of confusion that occurs in the late afternoon and lasts into the night. Sundowning can cause various behaviors, such as confusion, anxiety, aggression or ignoring directions. Sundowning also can lead to pacing or wandering. Sundowning isn't a disease. It's a group of symptoms that occurs at a specific time of the day. These symptoms may affect people with Alzheimer's disease and other types of dementia. The exact cause of sundowning is not known. Factors that may worsen late-day confusion Fatigue. Spending a day in a place that's not familiar. Low lighting. Increased shadows. Disruption of the body's "internal clock." Trouble separating reality from dreams. Being hungry or thirsty. Presence of an infection, such as a urinary tract infection. Being bored or in pain. Depression. Tips for reducing sundowning Keep a predictable routine for bedtime, waking, meals and activities. Plan for activities and exposure to light during the day to support nighttime sleepiness. Limit daytime napping. Limit caffeine and sugar to morning hours. Turn on a night light to reduce agitation that occurs when surroundings are dark or not familiar. In the evening, try to reduce  background noise and stimulating activities. This includes TV viewing, which can sometimes be upsetting. In a strange or not familiar setting, bring familiar items, such as photographs. They can create a more relaxed setting. In the evening, play familiar, gentle music or relaxing sounds of nature, such as the sound of waves. Some research suggests that a low dose of melatonin may help ease sundowning. Melatonin is a naturally occurring hormone that induces sleepiness. It can help when taken alone or in combination with exposure to bright light during the day. It's possible that a medicine side effect, pain, depression or other condition could contribute to sundowning. Talk with a healthcare professional if you suspect that a condition might be making someone's sundowning worse. A urinary tract infection or sleep apnea could be contributing to sundowning, especially if it comes on quickly.    Please schedule a Follow-up Appointment to: Return if symptoms worsen or fail to improve.  If you have any other questions or concerns, please feel free to call the office or send a message through MyChart. You may also schedule an earlier appointment if necessary.  Additionally, you may be receiving a survey about your experience at our office within a few days to 1 week by e-mail or mail. We value your feedback.  [name], DO [facility] [place], [place]

## 2022-07-22 NOTE — Chronic Care Management (AMB) (Signed)
Chronic Care Management   CCM RN Visit Note  07/22/2022 Name: Lucas Caldwell MRN: [id number] DOB: Apr 25, 1933  Subjective: Lucas Caldwell is a 89 y.o. year old male who is a primary care patient of Smitty Cords, DO. The patient was referred to the Chronic Care Management team for assistance with care management needs subsequent to provider initiation of CCM services and plan of care.    Today's Visit:   incoming call from the patients wife and DRP  for follow up visit.        Goals Addressed             This Visit's Progress    CCM (DIABETES) EXPECTED OUTCOME: MONITOR, SELF-MANAGE AND REDUCE SYMPTOMS OF DIABETES       Current Barriers:  Knowledge Deficits related to the importance of blood sugars control in the patient with DM and multiple other chronic conditions Care Coordination needs related to ongoing support and education  in a patient with DM Chronic Disease Management support and education needs related to effective management of DM A1C goal of <8.0% Lab Results  Component Value Date   HGBA1C 7.5 (A) 06/05/2022     Planned Interventions: Provided education to patient about basic DM disease process. Review and education provided. The patients A1C is trending down. Praised for working hard to get A1C down.; Reviewed medications with patient and discussed importance of medication adherence. The patient is compliant with medications. Is compliant with medications. Has medications and is working with the pharm D on a regular basis for effective management of medications and changes in medications. The patient states compliance with his medications. His wife was not there today to talk with the RNCM. The patient states he gets mixed up sometimes but his wife keeps him straight.        Reviewed prescribed diet with patient heart healthy/ADA. The patient and his wife are mindful of dietary restrictions. ; Counseled on importance of regular laboratory monitoring as  prescribed. Has labs on a regular basis. Education and support given;        Discussed plans with patient for ongoing care management follow up and provided patient with direct contact information for care management team;      Provided patient with written educational materials related to hypo and hyperglycemia and importance of correct treatment;       Reviewed scheduled/upcoming provider appointments including: 10-02-2022 at 240 pm       Advised patient, providing education and rationale, to check cbg twice daily and when you have symptoms of low or high blood sugar and record. Per pharmacy noted the patient has been on better target of his blood sugars. His wife helps makes sure he is taking his medications as ordered.  Education given for the goal of fasting to be 130 of less and post prandial of <180.    call provider for findings outside established parameters;       Referral made to pharmacy team for assistance with ongoing support and education for medication needs., The patient works with the pharm D on a regular basis to support and provided ongoing help and education;       Review of patient status, including review of consultants reports, relevant laboratory and other test results, and medications completed;       Advised patient to discuss changes in DM and other questions and concerns with provider;      Screening for signs and symptoms of depression related to chronic disease  state;        Assessed social determinant of health barriers;  Eye exam completed:  06-10-2022 Had foot exam on 06-05-2022. The patient is compliant with regular preventive care.     The patients wife states that the patient needs to be seen by the pcp. The patient has an appointment this afternoon to see pcp. Possible UTI. The patient does not feel well and is not his normal self. Education and support given. Collaboration with the pcp and office staff.      Symptom Management: Take medications as prescribed    Attend all scheduled provider appointments Call provider office for new concerns or questions  call the Suicide and Crisis Lifeline: 988 call the [place] National Suicide Prevention Lifeline: 939-103-8129 or TTY: 249-354-0232 TTY 234-258-8061) to talk to a trained counselor call 1-800-273-TALK (toll free, 24 hour hotline) if experiencing a Mental Health or Behavioral Health Crisis  check feet daily for cuts, sores or redness trim toenails straight across wash and dry feet carefully every day wear comfortable, cotton socks wear comfortable, well-fitting shoes  Follow Up Plan: Telephone follow up appointment with care management team member scheduled for: 08-21-2022 at 0945 am       CCM Expected Outcome:  Monitor, Self-Manage and Reduce Symptoms of Heart Failure       Current Barriers:  Knowledge Deficits related to the need to keep weights in balance and to monitor for changes that impact heart failure and cause exacerbations Care Coordination needs related to ongoing support and education needs in a patient with CHF Chronic Disease Management support and education needs related to effective management of CHF  Wt Readings from Last 3 Encounters:  07/17/22 195 lb 4 oz (88.6 kg)  07/09/22 199 lb (90.3 kg)  07/08/22 199 lb (90.3 kg)    Planned Interventions: Basic overview and discussion of pathophysiology of Heart Failure reviewed. The patient was at the HF clinic last week. Weight is stable and the patient states that sometimes he has shortness of breath, sometimes not. The patient denies any acute distress today.  Provided education on low sodium diet. The patient wife states they are really watching his sodium content in foods Reviewed Heart Failure Action Plan in depth and provided written copy Assessed need for readable accurate scales in home. The patient has a mat that he weighs on daily that reports his weight to the heart failure clinic. His weight has been fluctuating some and  last week he had to change the way he takes his his diuretic for a few days. His wife states his weight has come back down and he continues to submit his readings every day. Denies any acute changes today. He has parameters to go by to make sure he does not go into exacerbation. He and his wife have a good understanding of how to effectively manage his heart failure.  Provided education about placing scale on hard, flat surface Advised patient to weigh each morning after emptying bladder Discussed importance of daily weight and advised patient to weigh and record daily Reviewed role of diuretics in prevention of fluid overload and management of heart failure. The patient is working with the cardiologist for trying to figure out the best way to keep fluids and weight balanced. [name] compliant with medications. The wife knows to call for acute changes and get recommendations from the provider. Also is working with the pharm D for effective management of his HF medications and other medications. Saw HF clinic provider recently  as well as his regular cardiologist.  Discussed the importance of keeping all appointments with provider. The patient saw the cardiologist last week Sees on a regular basis. Knows to call for changes on new concerns.  Next appointment to follow up with the pcp will be 10-02-2022 at 240 pm Advised patient to discuss changes in heart failure with provider Screening for signs and symptoms of depression related to chronic disease state  Assessed social determinant of health barriers Wife states the patients fluid balance is good but the patient is "off". Wants to see the pcp. Appointment for this afternoon to be evaluated.   Symptom Management: Take medications as prescribed   Attend all scheduled provider appointments Call provider office for new concerns or questions  call the Suicide and Crisis Lifeline: 988 call the [place] National Suicide Prevention Lifeline: 434-704-7428 or TTY:  951-259-5049 TTY (321) 068-7752) to talk to a trained counselor call 1-800-273-TALK (toll free, 24 hour hotline) if experiencing a Mental Health or Behavioral Health Crisis  call office if I gain more than 2 pounds in one day or 5 pounds in one week track weight in diary use salt in moderation watch for swelling in feet, ankles and legs every day weigh myself daily develop a rescue plan follow rescue plan if symptoms flare-up track symptoms and what helps feel better or worse  Follow Up Plan: Telephone follow up appointment with care management team member scheduled for: 08-21-2022 at 0945 am          Plan:Telephone follow up appointment with care management team member scheduled for:  08-21-2022 at 945 am  Alto Denver RN, MSN, CCM RN Care Manager  Chronic Care Management Direct Number: [phone]

## 2022-07-22 NOTE — Chronic Care Management (AMB) (Signed)
Chronic Care Management   CCM RN Visit Note  07/22/2022 Name: Lucas Caldwell MRN: [id number] DOB: January 28, 1934  Subjective: Lucas Caldwell is a 89 y.o. year old male who is a primary care patient of Smitty Cords, DO. The patient was referred to the Chronic Care Management team for assistance with care management needs subsequent to provider initiation of CCM services and plan of care.    Today's Visit:  Engaged with patient by telephone for follow up visit.        Goals Addressed             This Visit's Progress    CCM (DIABETES) EXPECTED OUTCOME: MONITOR, SELF-MANAGE AND REDUCE SYMPTOMS OF DIABETES       Current Barriers:  Knowledge Deficits related to the importance of blood sugars control in the patient with DM and multiple other chronic conditions Care Coordination needs related to ongoing support and education  in a patient with DM Chronic Disease Management support and education needs related to effective management of DM A1C goal of <8.0% Lab Results  Component Value Date   HGBA1C 7.5 (A) 06/05/2022     Planned Interventions: Provided education to patient about basic DM disease process. Review and education provided. The patients A1C is trending down. Praised for working hard to get A1C down.; Reviewed medications with patient and discussed importance of medication adherence. The patient is compliant with medications. Is compliant with medications. Has medications and is working with the pharm D on a regular basis for effective management of medications and changes in medications. The patient states compliance with his medications. His wife was not there today to talk with the RNCM. The patient states he gets mixed up sometimes but his wife keeps him straight.        Reviewed prescribed diet with patient heart healthy/ADA. The patient and his wife are mindful of dietary restrictions. ; Counseled on importance of regular laboratory monitoring as prescribed.  Has labs on a regular basis. Education and support given;        Discussed plans with patient for ongoing care management follow up and provided patient with direct contact information for care management team;      Provided patient with written educational materials related to hypo and hyperglycemia and importance of correct treatment;       Reviewed scheduled/upcoming provider appointments including: 10-02-2022 at 240 pm       Advised patient, providing education and rationale, to check cbg twice daily and when you have symptoms of low or high blood sugar and record. Per pharmacy noted the patient has been on better target of his blood sugars. His wife helps makes sure he is taking his medications as ordered.  Education given for the goal of fasting to be 130 of less and post prandial of <180.    call provider for findings outside established parameters;       Referral made to pharmacy team for assistance with ongoing support and education for medication needs., The patient works with the pharm D on a regular basis to support and provided ongoing help and education;       Review of patient status, including review of consultants reports, relevant laboratory and other test results, and medications completed;       Advised patient to discuss changes in DM and other questions and concerns with provider;      Screening for signs and symptoms of depression related to chronic disease state;  Assessed social determinant of health barriers;  Eye exam completed:  06-10-2022 Had foot exam on 06-05-2022. The patient is compliant with regular preventive care.         Symptom Management: Take medications as prescribed   Attend all scheduled provider appointments Call provider office for new concerns or questions  call the Suicide and Crisis Lifeline: 988 call the [place] National Suicide Prevention Lifeline: 301-502-3981 or TTY: (908)079-9827 TTY 250-879-7191) to talk to a trained counselor call  1-800-273-TALK (toll free, 24 hour hotline) if experiencing a Mental Health or Behavioral Health Crisis  check feet daily for cuts, sores or redness trim toenails straight across wash and dry feet carefully every day wear comfortable, cotton socks wear comfortable, well-fitting shoes  Follow Up Plan: Telephone follow up appointment with care management team member scheduled for: 08-21-2022 at 0945 am       CCM Expected Outcome:  Monitor, Self-Manage and Reduce Symptoms of Heart Failure       Current Barriers:  Knowledge Deficits related to the need to keep weights in balance and to monitor for changes that impact heart failure and cause exacerbations Care Coordination needs related to ongoing support and education needs in a patient with CHF Chronic Disease Management support and education needs related to effective management of CHF  Wt Readings from Last 3 Encounters:  07/17/22 195 lb 4 oz (88.6 kg)  07/09/22 199 lb (90.3 kg)  07/08/22 199 lb (90.3 kg)    Planned Interventions: Basic overview and discussion of pathophysiology of Heart Failure reviewed. The patient was at the HF clinic last week. Weight is stable and the patient states that sometimes he has shortness of breath, sometimes not. The patient denies any acute distress today.  Provided education on low sodium diet. The patient wife states they are really watching his sodium content in foods Reviewed Heart Failure Action Plan in depth and provided written copy Assessed need for readable accurate scales in home. The patient has a mat that he weighs on daily that reports his weight to the heart failure clinic. His weight has been fluctuating some and last week he had to change the way he takes his his diuretic for a few days. His wife states his weight has come back down and he continues to submit his readings every day. Denies any acute changes today. He has parameters to go by to make sure he does not go into exacerbation. He and  his wife have a good understanding of how to effectively manage his heart failure.  Provided education about placing scale on hard, flat surface Advised patient to weigh each morning after emptying bladder Discussed importance of daily weight and advised patient to weigh and record daily Reviewed role of diuretics in prevention of fluid overload and management of heart failure. The patient is working with the cardiologist for trying to figure out the best way to keep fluids and weight balanced. [name] compliant with medications. The wife knows to call for acute changes and get recommendations from the provider. Also is working with the pharm D for effective management of his HF medications and other medications. Saw HF clinic provider recently as well as his regular cardiologist.  Discussed the importance of keeping all appointments with provider. The patient saw the cardiologist last week Sees on a regular basis. Knows to call for changes on new concerns.  Next appointment to follow up with the pcp will be 10-02-2022 at 240 pm Advised patient to discuss changes in heart  failure with provider Screening for signs and symptoms of depression related to chronic disease state  Assessed social determinant of health barriers  Symptom Management: Take medications as prescribed   Attend all scheduled provider appointments Call provider office for new concerns or questions  call the Suicide and Crisis Lifeline: 988 call the [place] National Suicide Prevention Lifeline: 858-868-4434 or TTY: (458) 837-2331 TTY 952-639-8428) to talk to a trained counselor call 1-800-273-TALK (toll free, 24 hour hotline) if experiencing a Mental Health or Behavioral Health Crisis  call office if I gain more than 2 pounds in one day or 5 pounds in one week track weight in diary use salt in moderation watch for swelling in feet, ankles and legs every day weigh myself daily develop a rescue plan follow rescue plan if symptoms  flare-up track symptoms and what helps feel better or worse  Follow Up Plan: Telephone follow up appointment with care management team member scheduled for: 08-21-2022 at 0945 am       CCM Expected Outcome:  Monitor, Self-Manage, and Reduce Symptoms of Hypertension       Current Barriers:  Chronic Disease Management support and education needs related to effective management of HTN BP Readings from Last 3 Encounters:  07/17/22 128/78  07/09/22 132/82  07/08/22 107/73     Planned Interventions: Evaluation of current treatment plan related to hypertension self management and patient's adherence to plan as established by provider. The patient is having more stable blood pressures. The patients wife states over all he is doing well. The patient feels like he is confused today. Education and support given. He states that his wife has gone to the doctor for herself. She will be back later. He states overall he is doing well. Denies any acute changes in his conditions.  Provided education to patient re: stroke prevention, s/s of heart attack and stroke; Reviewed prescribed diet heart healthy/ADA diet. The patient has a good appetite and is eating well. [name] mindful of dietary restrictions and follows a heart healthy/ADA diet  Reviewed medications with patient and discussed importance of compliance. The patient is compliant with medications. The patient is compliant with medications. Works with pharm D on a regular basis for effective management of medications. ;  Discussed plans with patient for ongoing care management follow up and provided patient with direct contact information for care management team; Advised patient, providing education and rationale, to monitor blood pressure daily and record, calling PCP for findings outside established parameters;  Advised patient to discuss changes in blood pressures and heart health with provider; Provided education on prescribed diet heart healthy/ADA diet  ;  Discussed complications of poorly controlled blood pressure such as heart disease, stroke, circulatory complications, vision complications, kidney impairment, sexual dysfunction;  Screening for signs and symptoms of depression related to chronic disease state;  Assessed social determinant of health barriers;   Symptom Management: Take medications as prescribed   Attend all scheduled provider appointments Call provider office for new concerns or questions  call the Suicide and Crisis Lifeline: 988 call the [place] National Suicide Prevention Lifeline: (928)077-1763 or TTY: 234-038-1147 TTY (786)729-7331) to talk to a trained counselor call 1-800-273-TALK (toll free, 24 hour hotline) if experiencing a Mental Health or Behavioral Health Crisis  check blood pressure 3 times per week write blood pressure results in a log or diary learn about high blood pressure keep a blood pressure log take blood pressure log to all doctor appointments call doctor for signs and symptoms of high  blood pressure develop an action plan for high blood pressure keep all doctor appointments take medications for blood pressure exactly as prescribed report new symptoms to your doctor  Follow Up Plan: Telephone follow up appointment with care management team member scheduled for: 08-21-2022 at 0945 am          Plan:Telephone follow up appointment with care management team member scheduled for:  08-21-2022 at 0945 am  Alto Denver RN, MSN, CCM RN Care Manager  Chronic Care Management Direct Number: [phone]

## 2022-07-22 NOTE — Patient Instructions (Signed)
Please call the care guide team at [phone] if you need to cancel or reschedule your appointment.   If you are experiencing a Mental Health or Behavioral Health Crisis or need someone to talk to, please call the Suicide and Crisis Lifeline: 988 call the [place] National Suicide Prevention Lifeline: 843-505-9677 or TTY: 606-357-5901 TTY 320-030-1825) to talk to a trained counselor call 1-800-273-TALK (toll free, 24 hour hotline)   Following is a copy of the CCM Program Consent:  CCM service includes personalized support from designated clinical staff supervised by the physician, including individualized plan of care and coordination with other care providers 24/7 contact phone numbers for assistance for urgent and routine care needs. Service will only be billed when office clinical staff spend 20 minutes or more in a month to coordinate care. Only one practitioner may furnish and bill the service in a calendar month. The patient may stop CCM services at amy time (effective at the end of the month) by phone call to the office staff. The patient will be responsible for cost sharing (co-pay) or up to 20% of the service fee (after annual deductible is met)  Following is a copy of your full provider care plan:   Goals Addressed             This Visit's Progress    CCM (DIABETES) EXPECTED OUTCOME: MONITOR, SELF-MANAGE AND REDUCE SYMPTOMS OF DIABETES       Current Barriers:  Knowledge Deficits related to the importance of blood sugars control in the patient with DM and multiple other chronic conditions Care Coordination needs related to ongoing support and education  in a patient with DM Chronic Disease Management support and education needs related to effective management of DM A1C goal of <8.0% Lab Results  Component Value Date   HGBA1C 7.5 (A) 06/05/2022     Planned Interventions: Provided education to patient about basic DM disease process. Review and education provided. The  patients A1C is trending down. Praised for working hard to get A1C down.; Reviewed medications with patient and discussed importance of medication adherence. The patient is compliant with medications. Is compliant with medications. Has medications and is working with the pharm D on a regular basis for effective management of medications and changes in medications. The patient states compliance with his medications. His wife was not there today to talk with the RNCM. The patient states he gets mixed up sometimes but his wife keeps him straight.        Reviewed prescribed diet with patient heart healthy/ADA. The patient and his wife are mindful of dietary restrictions. ; Counseled on importance of regular laboratory monitoring as prescribed. Has labs on a regular basis. Education and support given;        Discussed plans with patient for ongoing care management follow up and provided patient with direct contact information for care management team;      Provided patient with written educational materials related to hypo and hyperglycemia and importance of correct treatment;       Reviewed scheduled/upcoming provider appointments including: 10-02-2022 at 240 pm       Advised patient, providing education and rationale, to check cbg twice daily and when you have symptoms of low or high blood sugar and record. Per pharmacy noted the patient has been on better target of his blood sugars. His wife helps makes sure he is taking his medications as ordered.  Education given for the goal of fasting to be 130 of less and post  prandial of <180.    call provider for findings outside established parameters;       Referral made to pharmacy team for assistance with ongoing support and education for medication needs., The patient works with the pharm D on a regular basis to support and provided ongoing help and education;       Review of patient status, including review of consultants reports, relevant laboratory and other  test results, and medications completed;       Advised patient to discuss changes in DM and other questions and concerns with provider;      Screening for signs and symptoms of depression related to chronic disease state;        Assessed social determinant of health barriers;  Eye exam completed:  06-10-2022 Had foot exam on 06-05-2022. The patient is compliant with regular preventive care.         Symptom Management: Take medications as prescribed   Attend all scheduled provider appointments Call provider office for new concerns or questions  call the Suicide and Crisis Lifeline: 988 call the [place] National Suicide Prevention Lifeline: 419-361-0685 or TTY: 438-571-5656 TTY (949)657-3110) to talk to a trained counselor call 1-800-273-TALK (toll free, 24 hour hotline) if experiencing a Mental Health or Behavioral Health Crisis  check feet daily for cuts, sores or redness trim toenails straight across wash and dry feet carefully every day wear comfortable, cotton socks wear comfortable, well-fitting shoes  Follow Up Plan: Telephone follow up appointment with care management team member scheduled for: 08-21-2022 at 0945 am       CCM Expected Outcome:  Monitor, Self-Manage and Reduce Symptoms of Heart Failure       Current Barriers:  Knowledge Deficits related to the need to keep weights in balance and to monitor for changes that impact heart failure and cause exacerbations Care Coordination needs related to ongoing support and education needs in a patient with CHF Chronic Disease Management support and education needs related to effective management of CHF  Wt Readings from Last 3 Encounters:  07/17/22 195 lb 4 oz (88.6 kg)  07/09/22 199 lb (90.3 kg)  07/08/22 199 lb (90.3 kg)    Planned Interventions: Basic overview and discussion of pathophysiology of Heart Failure reviewed. The patient was at the HF clinic last week. Weight is stable and the patient states that sometimes he has  shortness of breath, sometimes not. The patient denies any acute distress today.  Provided education on low sodium diet. The patient wife states they are really watching his sodium content in foods Reviewed Heart Failure Action Plan in depth and provided written copy Assessed need for readable accurate scales in home. The patient has a mat that he weighs on daily that reports his weight to the heart failure clinic. His weight has been fluctuating some and last week he had to change the way he takes his his diuretic for a few days. His wife states his weight has come back down and he continues to submit his readings every day. Denies any acute changes today. He has parameters to go by to make sure he does not go into exacerbation. He and his wife have a good understanding of how to effectively manage his heart failure.  Provided education about placing scale on hard, flat surface Advised patient to weigh each morning after emptying bladder Discussed importance of daily weight and advised patient to weigh and record daily Reviewed role of diuretics in prevention of fluid overload and management of heart  failure. The patient is working with the cardiologist for trying to figure out the best way to keep fluids and weight balanced. [name] compliant with medications. The wife knows to call for acute changes and get recommendations from the provider. Also is working with the pharm D for effective management of his HF medications and other medications. Saw HF clinic provider recently as well as his regular cardiologist.  Discussed the importance of keeping all appointments with provider. The patient saw the cardiologist last week Sees on a regular basis. Knows to call for changes on new concerns.  Next appointment to follow up with the pcp will be 10-02-2022 at 240 pm Advised patient to discuss changes in heart failure with provider Screening for signs and symptoms of depression related to chronic disease state   Assessed social determinant of health barriers  Symptom Management: Take medications as prescribed   Attend all scheduled provider appointments Call provider office for new concerns or questions  call the Suicide and Crisis Lifeline: 988 call the [place] National Suicide Prevention Lifeline: 862-727-3875 or TTY: (401)366-1748 TTY 567-005-7941) to talk to a trained counselor call 1-800-273-TALK (toll free, 24 hour hotline) if experiencing a Mental Health or Behavioral Health Crisis  call office if I gain more than 2 pounds in one day or 5 pounds in one week track weight in diary use salt in moderation watch for swelling in feet, ankles and legs every day weigh myself daily develop a rescue plan follow rescue plan if symptoms flare-up track symptoms and what helps feel better or worse  Follow Up Plan: Telephone follow up appointment with care management team member scheduled for: 08-21-2022 at 0945 am       CCM Expected Outcome:  Monitor, Self-Manage, and Reduce Symptoms of Hypertension       Current Barriers:  Chronic Disease Management support and education needs related to effective management of HTN BP Readings from Last 3 Encounters:  07/17/22 128/78  07/09/22 132/82  07/08/22 107/73     Planned Interventions: Evaluation of current treatment plan related to hypertension self management and patient's adherence to plan as established by provider. The patient is having more stable blood pressures. The patients wife states over all he is doing well. The patient feels like he is confused today. Education and support given. He states that his wife has gone to the doctor for herself. She will be back later. He states overall he is doing well. Denies any acute changes in his conditions.  Provided education to patient re: stroke prevention, s/s of heart attack and stroke; Reviewed prescribed diet heart healthy/ADA diet. The patient has a good appetite and is eating well. [name] mindful of  dietary restrictions and follows a heart healthy/ADA diet  Reviewed medications with patient and discussed importance of compliance. The patient is compliant with medications. The patient is compliant with medications. Works with pharm D on a regular basis for effective management of medications. ;  Discussed plans with patient for ongoing care management follow up and provided patient with direct contact information for care management team; Advised patient, providing education and rationale, to monitor blood pressure daily and record, calling PCP for findings outside established parameters;  Advised patient to discuss changes in blood pressures and heart health with provider; Provided education on prescribed diet heart healthy/ADA diet ;  Discussed complications of poorly controlled blood pressure such as heart disease, stroke, circulatory complications, vision complications, kidney impairment, sexual dysfunction;  Screening for signs and symptoms of depression  related to chronic disease state;  Assessed social determinant of health barriers;   Symptom Management: Take medications as prescribed   Attend all scheduled provider appointments Call provider office for new concerns or questions  call the Suicide and Crisis Lifeline: 988 call the [place] National Suicide Prevention Lifeline: 236-289-8013 or TTY: [phone] TTY 4070933365) to talk to a trained counselor call 1-800-273-TALK (toll free, 24 hour hotline) if experiencing a Mental Health or Behavioral Health Crisis  check blood pressure 3 times per week write blood pressure results in a log or diary learn about high blood pressure keep a blood pressure log take blood pressure log to all doctor appointments call doctor for signs and symptoms of high blood pressure develop an action plan for high blood pressure keep all doctor appointments take medications for blood pressure exactly as prescribed report new symptoms to your  doctor  Follow Up Plan: Telephone follow up appointment with care management team member scheduled for: 08-21-2022 at 0945 am          The patient verbalized understanding of instructions, educational materials, and care plan provided today and DECLINED offer to receive copy of patient instructions, educational materials, and care plan.  Telephone follow up appointment with care management team member scheduled for: 08-21-2022 at 0945 am

## 2022-07-22 NOTE — Patient Instructions (Signed)
Please call the care guide team at [phone] if you need to cancel or reschedule your appointment.   If you are experiencing a Mental Health or Behavioral Health Crisis or need someone to talk to, please call the Suicide and Crisis Lifeline: 988 call the [place] National Suicide Prevention Lifeline: (670)793-8131 or TTY: (579)639-1260 TTY 579-586-7424) to talk to a trained counselor call 1-800-273-TALK (toll free, 24 hour hotline)   Following is a copy of the CCM Program Consent:  CCM service includes personalized support from designated clinical staff supervised by the physician, including individualized plan of care and coordination with other care providers 24/7 contact phone numbers for assistance for urgent and routine care needs. Service will only be billed when office clinical staff spend 20 minutes or more in a month to coordinate care. Only one practitioner may furnish and bill the service in a calendar month. The patient may stop CCM services at amy time (effective at the end of the month) by phone call to the office staff. The patient will be responsible for cost sharing (co-pay) or up to 20% of the service fee (after annual deductible is met)  Following is a copy of your full provider care plan:   Goals Addressed             This Visit's Progress    CCM (DIABETES) EXPECTED OUTCOME: MONITOR, SELF-MANAGE AND REDUCE SYMPTOMS OF DIABETES       Current Barriers:  Knowledge Deficits related to the importance of blood sugars control in the patient with DM and multiple other chronic conditions Care Coordination needs related to ongoing support and education  in a patient with DM Chronic Disease Management support and education needs related to effective management of DM A1C goal of <8.0% Lab Results  Component Value Date   HGBA1C 7.5 (A) 06/05/2022     Planned Interventions: Provided education to patient about basic DM disease process. Review and education provided. The  patients A1C is trending down. Praised for working hard to get A1C down.; Reviewed medications with patient and discussed importance of medication adherence. The patient is compliant with medications. Is compliant with medications. Has medications and is working with the pharm D on a regular basis for effective management of medications and changes in medications. The patient states compliance with his medications. His wife was not there today to talk with the RNCM. The patient states he gets mixed up sometimes but his wife keeps him straight.        Reviewed prescribed diet with patient heart healthy/ADA. The patient and his wife are mindful of dietary restrictions. ; Counseled on importance of regular laboratory monitoring as prescribed. Has labs on a regular basis. Education and support given;        Discussed plans with patient for ongoing care management follow up and provided patient with direct contact information for care management team;      Provided patient with written educational materials related to hypo and hyperglycemia and importance of correct treatment;       Reviewed scheduled/upcoming provider appointments including: 10-02-2022 at 240 pm       Advised patient, providing education and rationale, to check cbg twice daily and when you have symptoms of low or high blood sugar and record. Per pharmacy noted the patient has been on better target of his blood sugars. His wife helps makes sure he is taking his medications as ordered.  Education given for the goal of fasting to be 130 of less and post  prandial of <180.    call provider for findings outside established parameters;       Referral made to pharmacy team for assistance with ongoing support and education for medication needs., The patient works with the pharm D on a regular basis to support and provided ongoing help and education;       Review of patient status, including review of consultants reports, relevant laboratory and other  test results, and medications completed;       Advised patient to discuss changes in DM and other questions and concerns with provider;      Screening for signs and symptoms of depression related to chronic disease state;        Assessed social determinant of health barriers;  Eye exam completed:  06-10-2022 Had foot exam on 06-05-2022. The patient is compliant with regular preventive care.     The patients wife states that the patient needs to be seen by the pcp. The patient has an appointment this afternoon to see pcp. Possible UTI. The patient does not feel well and is not his normal self. Education and support given. Collaboration with the pcp and office staff.      Symptom Management: Take medications as prescribed   Attend all scheduled provider appointments Call provider office for new concerns or questions  call the Suicide and Crisis Lifeline: 988 call the [place] National Suicide Prevention Lifeline: (937)572-2042 or TTY: 213-594-6808 TTY 570-166-6466) to talk to a trained counselor call 1-800-273-TALK (toll free, 24 hour hotline) if experiencing a Mental Health or Behavioral Health Crisis  check feet daily for cuts, sores or redness trim toenails straight across wash and dry feet carefully every day wear comfortable, cotton socks wear comfortable, well-fitting shoes  Follow Up Plan: Telephone follow up appointment with care management team member scheduled for: 08-21-2022 at 0945 am       CCM Expected Outcome:  Monitor, Self-Manage and Reduce Symptoms of Heart Failure       Current Barriers:  Knowledge Deficits related to the need to keep weights in balance and to monitor for changes that impact heart failure and cause exacerbations Care Coordination needs related to ongoing support and education needs in a patient with CHF Chronic Disease Management support and education needs related to effective management of CHF  Wt Readings from Last 3 Encounters:  07/17/22 195 lb 4 oz (88.6  kg)  07/09/22 199 lb (90.3 kg)  07/08/22 199 lb (90.3 kg)    Planned Interventions: Basic overview and discussion of pathophysiology of Heart Failure reviewed. The patient was at the HF clinic last week. Weight is stable and the patient states that sometimes he has shortness of breath, sometimes not. The patient denies any acute distress today.  Provided education on low sodium diet. The patient wife states they are really watching his sodium content in foods Reviewed Heart Failure Action Plan in depth and provided written copy Assessed need for readable accurate scales in home. The patient has a mat that he weighs on daily that reports his weight to the heart failure clinic. His weight has been fluctuating some and last week he had to change the way he takes his his diuretic for a few days. His wife states his weight has come back down and he continues to submit his readings every day. Denies any acute changes today. He has parameters to go by to make sure he does not go into exacerbation. He and his wife have a good understanding of how to  effectively manage his heart failure.  Provided education about placing scale on hard, flat surface Advised patient to weigh each morning after emptying bladder Discussed importance of daily weight and advised patient to weigh and record daily Reviewed role of diuretics in prevention of fluid overload and management of heart failure. The patient is working with the cardiologist for trying to figure out the best way to keep fluids and weight balanced. [name] compliant with medications. The wife knows to call for acute changes and get recommendations from the provider. Also is working with the pharm D for effective management of his HF medications and other medications. Saw HF clinic provider recently as well as his regular cardiologist.  Discussed the importance of keeping all appointments with provider. The patient saw the cardiologist last week Sees on a regular  basis. Knows to call for changes on new concerns.  Next appointment to follow up with the pcp will be 10-02-2022 at 240 pm Advised patient to discuss changes in heart failure with provider Screening for signs and symptoms of depression related to chronic disease state  Assessed social determinant of health barriers Wife states the patients fluid balance is good but the patient is "off". Wants to see the pcp. Appointment for this afternoon to be evaluated.   Symptom Management: Take medications as prescribed   Attend all scheduled provider appointments Call provider office for new concerns or questions  call the Suicide and Crisis Lifeline: 988 call the [place] National Suicide Prevention Lifeline: 701-746-4988 or TTY: (463) 106-7317 TTY 313-544-9535) to talk to a trained counselor call 1-800-273-TALK (toll free, 24 hour hotline) if experiencing a Mental Health or Behavioral Health Crisis  call office if I gain more than 2 pounds in one day or 5 pounds in one week track weight in diary use salt in moderation watch for swelling in feet, ankles and legs every day weigh myself daily develop a rescue plan follow rescue plan if symptoms flare-up track symptoms and what helps feel better or worse  Follow Up Plan: Telephone follow up appointment with care management team member scheduled for: 08-21-2022 at 0945 am          The patient verbalized understanding of instructions, educational materials, and care plan provided today and DECLINED offer to receive copy of patient instructions, educational materials, and care plan.  Telephone follow up appointment with care management team member scheduled for: 08-21-2022 at 0945 am

## 2022-07-23 ENCOUNTER — Other Ambulatory Visit: Payer: Self-pay

## 2022-07-23 ENCOUNTER — Encounter: Payer: Self-pay | Admitting: Emergency Medicine

## 2022-07-23 ENCOUNTER — Emergency Department: Payer: Medicare HMO

## 2022-07-23 ENCOUNTER — Emergency Department
Admission: EM | Admit: 2022-07-23 | Discharge: 2022-07-23 | Disposition: A | Discharge status: Home / Self Care | Payer: Medicare HMO | Attending: Emergency Medicine | Admitting: Emergency Medicine

## 2022-07-23 DIAGNOSIS — I2510 Atherosclerotic heart disease of native coronary artery without angina pectoris: Secondary | ICD-10-CM | POA: Diagnosis not present

## 2022-07-23 DIAGNOSIS — I110 Hypertensive heart disease with heart failure: Secondary | ICD-10-CM | POA: Diagnosis not present

## 2022-07-23 DIAGNOSIS — I5023 Acute on chronic systolic (congestive) heart failure: Secondary | ICD-10-CM | POA: Diagnosis not present

## 2022-07-23 DIAGNOSIS — R339 Retention of urine, unspecified: Secondary | ICD-10-CM | POA: Insufficient documentation

## 2022-07-23 DIAGNOSIS — J811 Chronic pulmonary edema: Secondary | ICD-10-CM | POA: Diagnosis not present

## 2022-07-23 DIAGNOSIS — R0602 Shortness of breath: Secondary | ICD-10-CM | POA: Insufficient documentation

## 2022-07-23 LAB — CBC
HCT: 42.1 % (ref 39.0–52.0)
Hemoglobin: 13.2 g/dL (ref 13.0–17.0)
MCH: 27.9 pg (ref 26.0–34.0)
MCHC: 31.4 g/dL (ref 30.0–36.0)
MCV: 89 fL (ref 80.0–100.0)
Platelets: 233 10*3/uL (ref 150–400)
RBC: 4.73 MIL/uL (ref 4.22–5.81)
RDW: 15.3 % (ref 11.5–15.5)
WBC: 8 10*3/uL (ref 4.0–10.5)
nRBC: 0 % (ref 0.0–0.2)

## 2022-07-23 LAB — BASIC METABOLIC PANEL
Anion gap: 10 (ref 5–15)
BUN: 20 mg/dL (ref 8–23)
CO2: 20 mmol/L — ABNORMAL LOW (ref 22–32)
Calcium: 9.4 mg/dL (ref 8.9–10.3)
Chloride: 108 mmol/L (ref 98–111)
Creatinine, Ser: 1.29 mg/dL — ABNORMAL HIGH (ref 0.61–1.24)
GFR, Estimated: 54 mL/min — ABNORMAL LOW (ref >60)
Glucose, Bld: 182 mg/dL — ABNORMAL HIGH (ref 70–99)
Potassium: 4.3 mmol/L (ref 3.5–5.1)
Sodium: 138 mmol/L (ref 135–145)

## 2022-07-23 LAB — BRAIN NATRIURETIC PEPTIDE: B Natriuretic Peptide: 656.2 pg/mL — ABNORMAL HIGH (ref 0.0–100.0)

## 2022-07-23 MED ORDER — TORSEMIDE 20 MG PO TABS
80.0000 mg | ORAL_TABLET | Freq: Once | ORAL | Status: AC
  Administered 2022-07-23: 80 mg via ORAL
  Filled 2022-07-23: qty 4

## 2022-07-23 NOTE — ED Notes (Signed)
Bladder scan reveals urine. Pt states he came in for SOB. EKG performed.

## 2022-07-23 NOTE — ED Triage Notes (Addendum)
Patient to ED via POV for difficulty urinating since this AM. Was suppose to see by urology on 5/2- had a bag on side but unable to tell me what for and no longer has said bag. Wasn't able to see urologist.

## 2022-07-23 NOTE — ED Notes (Signed)
Multiple attempts to bladder scan showing in bladder.

## 2022-07-23 NOTE — ED Provider Notes (Signed)
[facility] Provider Note    Event Date/Time   First MD Initiated Contact with Patient 07/23/22 1138     (approximate)   History   Urinary Retention   HPI  Lucas Caldwell is a 89 y.o. male   presenting to the emergency department for evaluation of shortness of breath.  Has a history of CAD, CHF, recently undergoing diuresis by cardiology with Furoscix.  Per cardiology note sounds like patient was supposed to to transition from this to torsemide 80 mg twice daily, but patient reports he was unaware of this and has not been taking any diuretics since he completed his Furoscix. Reports plan for heart catheterization if he remains symptomatic, does have follow-up appointment with cardiology tomorrow.  Had worsening shortness of breath today leading to ER presentation.  Denies chest pain.  Of note, triage documents urinary retention as chief complaint, but patient reports he has just been peeing less, but does feel that he is emptying his bladder, denies dysuria.      Physical Exam   Triage Vital Signs: ED Triage Vitals  Enc Vitals Group     BP 07/23/22 1033 (!) 144/95     Pulse Rate 07/23/22 1033 (!) 110     Resp 07/23/22 1033 18     Temp 07/23/22 1033 98.4 F (36.9 C)     Temp Source 07/23/22 1033 Oral     SpO2 07/23/22 1033 98 %     Weight --      Height --      Head Circumference --      Peak Flow --      Pain Score 07/23/22 1031 0     Pain Loc --      Pain Edu? --      Excl. in GC? --     Most recent vital signs: Vitals:   07/23/22 1033  BP: (!) 144/95  Pulse: (!) 110  Resp: 18  Temp: 98.4 F (36.9 C)  SpO2: 98%     General: Awake, no distress.  CV:  Mild tachycardia with regular rhythm, normal peripheral perfusion Resp:  Normal respiratory effort, sats in the mid to upper 90s on room air, bibasilar crackles noted.  No significant lower extremity edema. Abd:  No distention.     ED Results / Procedures / Treatments    Labs (all labs ordered are listed, but only abnormal results are displayed) Labs Reviewed  BASIC METABOLIC PANEL - Abnormal; Notable for the following components:      Result Value   CO2 20 (*)    Glucose, Bld 182 (*)    Creatinine, Ser 1.29 (*)    GFR, Estimated 54 (*)    All other components within normal limits  BRAIN NATRIURETIC PEPTIDE - Abnormal; Notable for the following components:   B Natriuretic Peptide 656.2 (*)    All other components within normal limits  CBC     EKG ED ECG REPORT I, Dior Stepter, the attending physician, personally viewed and interpreted this ECG.  Date: 07/23/2022 EKG demonstrates sinus rhythm with left bundle branch block, PR 2 8, QRS 150, QTc 510, similar to prior from 07/08/2022.  RADIOLOGY I personally viewed and interpreted the patient's chest x-Sofya Moustafa which did demonstrate some central congestion and bibasilar pulmonary edema, perhaps slightly progressed from prior on my review.    PROCEDURES:  Critical Care performed: No  Procedures   MEDICATIONS ORDERED IN ED: Medications  torsemide (DEMADEX) tablet 80 mg (has no  administration in time range)     IMPRESSION / MDM / ASSESSMENT AND PLAN / ED COURSE  I reviewed the triage vital signs and the nursing notes.   Differential diagnosis includes, but is not limited to, CHF exacerbation, pneumonia, pneumothorax, lower suspicion ischemia.  Patient's presentation is most consistent with acute presentation with potential threat to life or bodily function.  89 year old male who presents with shortness of breath in the setting of known heart failure.  BNP slightly elevated from prior, has not been on diuretics for the past few days.  Here, does have some bibasilar crackles, but is satting appropriately on room air.  I did personally ambulate the patient in the hallway with a lowest O2 sat of 94%.  With this, do not think discharge is unreasonable with his close outpatient follow-up with  cardiology tomorrow.  Discussed this with the patient who is very interested in discharge.  Will give a dose of torsemide here prior to discharge.  Strict return precautions provided.  Patient discharged stable condition.   Clinical Course as of 07/23/22 1408  Tue Jul 23, 2022  1404 B Natriuretic Peptide(!): 656.2 Previously 509, slight uptrending, will order dose of torsemide here. [NR]    Clinical Course User Index [NR] Trinna Post, MD     FINAL CLINICAL IMPRESSION(S) / ED DIAGNOSES   Final diagnoses:  Acute on chronic systolic congestive heart failure (HCC)     Rx / DC Orders   ED Discharge Orders     None        Note:  This document was prepared using Dragon voice recognition software and may include unintentional dictation errors.   Trinna Post, MD 07/23/22 [phone]

## 2022-07-23 NOTE — Discharge Instructions (Addendum)
You were seen in the emergency department today for evaluation of your shortness of breath.  I suspect that this is partly related to fluid buildup in your lungs.  Fortunately her oxygen saturations here were reassuring.  Please keep your scheduled follow-up with cardiology tomorrow.  They may recommend adjustments to your medications.  We did give you 80 mg of torsemide here.  Return to the ER for new or worsening symptoms including chest pain, worsening difficulty breathing, or any other new or concerning symptoms.

## 2022-07-24 ENCOUNTER — Encounter: Payer: Self-pay | Admitting: Cardiology

## 2022-07-24 ENCOUNTER — Ambulatory Visit: Payer: Medicare HMO | Attending: Cardiology | Admitting: Cardiology

## 2022-07-24 VITALS — BP 110/69 | HR 95 | Wt 193.2 lb

## 2022-07-24 DIAGNOSIS — I5022 Chronic systolic (congestive) heart failure: Secondary | ICD-10-CM

## 2022-07-24 MED ORDER — SPIRONOLACTONE 25 MG PO TABS: 12.5000 mg | ORAL_TABLET | Freq: Every day | ORAL | 3 refills | Status: DC

## 2022-07-24 NOTE — Patient Instructions (Signed)
Take Furoscix today, tomorrow, and Friday.  RESTART Torsemide 80mg  twice daily on Saturday  START Spironolactone 12.5mg  daily  Lab work in 1 week  Follow up in 1 week  You have been referred to [facility] they will call you to schedule an appointment  Do the following things EVERYDAY: Weigh yourself in the morning before breakfast. Write it down and keep it in a log. Take your medicines as prescribed Eat low salt foods--Limit salt (sodium) to 2000 mg per day.  Stay as active as you can everyday Limit all fluids for the day to less than 2 liters

## 2022-07-25 NOTE — Progress Notes (Signed)
Advanced Heart Failure Clinic Note   Date:  07/25/2022   ID:  Lucas Caldwell, DOB 11/08/1933, MRN [id number]  Location: Home  Provider location: [place] [facility] Type of Visit: Established patient   PCP:  Lucas Cords, DO  Cardiologist:  Lucas Kendall, MD HF Cardiologist: Dr. Gala Caldwell  Chief Complaint:  No chief complaint on file.  Heart failure follow up   HPI: Lucas Caldwell is a 89 y.o. 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 Okey Caldwell for further evaluation of his HF.     Has h/o NICM previously followed by Dr. Devonne Caldwell at [facility]. He had good response to medical therapy in 2012 EF was reported to be 50%    Had PCI of his RCA CAD s/p RCA stent x2 08/2016 at [facility].    Echo 6/20 LVEF 35-40%   Cardiac cath 08/2018 70% LM stenosis with abnl FFR of 0.86, though drop in iFR was due to diffuse prox LAD disease and only small gradient involving ostial LM. Imdur was increased.    Admitted 04/13/19 with chest pain and A/C systolic heart failure. Repeat Cath 1/21 LM 60% LAD 70% with +FFR otherwise diffuse non-obstructive CAD -> PCI/DES LAD 1/21. RHC: RA 10 PA 53/30 (40) PCWP 28 CO/CI 4.2/2.1 PA sat 67%.   Echo 04/22/19 EF 30-35%   Referred to [facility] 04/2019 for further management of his systolic HF. Had cardiomems implanted in 7/22   Zio 4/21 SR w/ 1st AVB, avg HR 81 bpm, 1 brief 5 beat run of NSVT, Two runs SVT   Echo 8/22 EF 30-35% RV ok   Echo 1/23 EF 30-35%  Previously torsemide dropped back to 60 daily (? bid) and metolazone added every Wednesday. Did not tolerate and developed AKI. Saw Lucas Caldwell and torsemide increased from 60 bid to 80/60 for elevated CardioMems (20)    Seen in ED 02/24/22 with AMS and found to have flu A  Today Lucas Caldwell presents for a 1 week follow up. He was seen  last week by Dr. Gasper Caldwell for increased dyspnea and noted to be volume overloaded.  He was given Furoscix 80 mg [place] daily x 3 days and felt much better.  He was supposed to start back on torsemide after completing Furoscix but did not realize this so went for 4-5 days with no diuretic.  He developed progressive dyspnea and weight gain, went to the ER on 5/17 and was given a dose of torsemide and sent home.  CXR showed mild pulmonary edema. [name] short of breath walking into the office, short of breath dressing and doing ADLs. He sleeps on 2 pillows chronically.  Has had episodes of PND last couple of nights. No chest pain. No lightheadedness. He has mild confusion/forgetfulness, his wife says that this has been chronic.   Cardiomems PADP 36, goal 19.  He was in the 30s last week also when Furoscix started.   Labs (5/24): BNP 656, hgb 13.2, K 4.3, creatinine 1.29  ECG (5/24, personally reviewed): NSR, RBBB, LAFB    Past Medical History:  Diagnosis Date   Arthritis    "probably in his legs" (08/15/2016)   Blind right eye    BPH (benign prostatic hyperplasia)    Breast asymmetry    Left breast is larger, present for several years.  CAD (coronary artery disease)    a. Cath in the late 90's - reportedly ok;  b. 2014 s/p stenting x 2 @ [facility]; c 08/19/16 Cath/PCI with DES -> RCA, plan to treat LM 70% medically. Seen by surgery and felt to be too high risk for CABG; d. 08/2018 Cath: LM 70 (iFR 0.86), LAD 20ost, 40p, 50/33m, D1 70, LCX patent stent, OM1 30, RCA patent stent, 81m ISR, 40d, RPDA 30. PCWP 8. CO/CI 4.0/2.1; e. 03/2019 PCI to LAD (2.5x15 Resolute Onyx DES).   Chronic combined systolic (congestive) and diastolic (congestive) heart failure (HCC)    a. Previously reduced EF-->50% by echo in 2012;  b. 06/2015 Echo: EF 50-55%  c. 07/2016 Echo: EF 45-50%; d. 12/2017 Echo: EF 55%; e. 08/2018 Echo: EF 35-40%; f. 04/2019 Echo: EF 30-35%; g. s/p cardiomems; h. 10/2020 Echo: EF 30-35%; i. 01/2022 Echo: EF 30-35%,  glob HK, GrII DD, nl rV fxn, mild to mod MR.   Chronic kidney disease (CKD), stage IV (severe) (HCC)    CML (chronic myelocytic leukemia) (HCC)    GERD (gastroesophageal reflux disease)    GIB (gastrointestinal bleeding)    a. 12/2017 3 unit PRBC GIB in setting of coumadin-->Endo/colon multiple duodenal ulcers and a single bleeding ulcer in the proximal ascending colon status post hemostatic clipping x2.   Gout    Hyperlipemia    Hypertension    Hypothyroidism    Iron deficiency anemia    Ischemic cardiomyopathy    a. Previously reduced EF-->50% by echo in 2012;  b. 06/2015 Echo: EF 50-55%, Gr2 DD;  c. 07/2016 Echo: EF 45-50%; d. 12/2017 Echo: EF 55%, Gr1 DD, mild MR; e. 08/2018 Echo: EF 35-40%; f. 04/2019 Echo: EF 30-35%; g. 10/2020 Echo: EF 30-35%; h. Echo: EF 30-35%   Migraine    "in the 1960s" (08/15/2016)   Obstructive sleep apnea    Orthostatic hypotension    a. 20222 -->carvedilol and hydralazine d/c'd.   PAF (paroxysmal atrial fibrillation) (HCC)    a. ? Dx 2014-->s/p DCCV;  b. CHA2DS2VASc = 6--> Coumadin.   Prostate cancer (HCC)    RBBB    Type II diabetes mellitus [facility])    Past Surgical History:  Procedure Laterality Date   AMPUTATION TOE Right 02/23/2021   Procedure: AMPUTATION TOE;  Surgeon: Gwyneth Revels, DPM;  Location: [facility] ORS;  Service: Podiatry;  Laterality: Right;   CATARACT EXTRACTION W/ INTRAOCULAR LENS  IMPLANT, BILATERAL Bilateral    COLONOSCOPY N/A 01/13/2018   Procedure: COLONOSCOPY;  Surgeon: Toney Reil, MD;  Location: [facility] ENDOSCOPY;  Service: Gastroenterology;  Laterality: N/A;   COLONOSCOPY WITH PROPOFOL N/A 01/01/2018   Procedure: COLONOSCOPY WITH PROPOFOL;  Surgeon: Toney Reil, MD;  Location: [facility] ENDOSCOPY;  Service: Gastroenterology;  Laterality: N/A;   CORONARY ANGIOPLASTY WITH STENT PLACEMENT  09/2012   2 stents   CORONARY STENT INTERVENTION N/A 08/19/2016   Procedure: Coronary Stent Intervention;  Surgeon: Lucas Kendall, MD;   Location: [facility] INVASIVE CV LAB;  Service: Cardiovascular;  Laterality: N/A;   CORONARY STENT INTERVENTION N/A 04/13/2019   Procedure: CORONARY STENT INTERVENTION;  Surgeon: Lucas Kendall, MD;  Location: [facility] INVASIVE CV LAB;  Service: Cardiovascular;  Laterality: N/A;   ESOPHAGEAL MANOMETRY N/A 12/16/2017   Procedure: ESOPHAGEAL MANOMETRY (EM);  Surgeon: Toney Reil, MD;  Location: [facility] ENDOSCOPY;  Service: Gastroenterology;  Laterality: N/A;   ESOPHAGOGASTRODUODENOSCOPY N/A 12/20/2016   Procedure: ESOPHAGOGASTRODUODENOSCOPY (EGD);  Surgeon: Toney Reil, MD;  Location[facility] ENDOSCOPY;  Service: Gastroenterology;  Laterality: N/A;   ESOPHAGOGASTRODUODENOSCOPY N/A 01/13/2018   Procedure: ESOPHAGOGASTRODUODENOSCOPY (EGD);  Surgeon: Toney Reil, MD;  Location: [facility] ENDOSCOPY;  Service: Gastroenterology;  Laterality: N/A;   ESOPHAGOGASTRODUODENOSCOPY (EGD) WITH PROPOFOL N/A 11/03/2017   Procedure: ESOPHAGOGASTRODUODENOSCOPY (EGD) WITH PROPOFOL;  Surgeon: Toney Reil, MD;  Location: [facility] ENDOSCOPY;  Service: Gastroenterology;  Laterality: N/A;   EYE SURGERY     HERNIA REPAIR     "navel"   LEFT HEART CATH AND CORONARY ANGIOGRAPHY N/A 08/14/2016   Procedure: Left Heart Cath and Coronary Angiography;  Surgeon: Lucas Kendall, MD;  Location: [facility] INVASIVE CV LAB;  Service: Cardiovascular;  Laterality: N/A;   PRESSURE SENSOR/CARDIOMEMS N/A 09/23/2019   Procedure: PRESSURE SENSOR/CARDIOMEMS;  Surgeon: Dolores Patty, MD;  Location: [facility] INVASIVE CV LAB;  Service: Cardiovascular;  Laterality: N/A;   RIGHT HEART CATH N/A 09/23/2019   Procedure: RIGHT HEART CATH;  Surgeon: Dolores Patty, MD;  Location: [facility] INVASIVE CV LAB;  Service: Cardiovascular;  Laterality: N/A;   RIGHT/LEFT HEART CATH AND CORONARY ANGIOGRAPHY N/A 09/01/2018   Procedure: RIGHT/LEFT HEART CATH AND CORONARY ANGIOGRAPHY;  Surgeon: Lucas Kendall, MD;  Location: [facility] INVASIVE CV LAB;  Service: Cardiovascular;   Laterality: N/A;   RIGHT/LEFT HEART CATH AND CORONARY ANGIOGRAPHY N/A 04/13/2019   Procedure: RIGHT/LEFT HEART CATH AND CORONARY ANGIOGRAPHY;  Surgeon: Lucas Kendall, MD;  Location: [facility] INVASIVE CV LAB;  Service: Cardiovascular;  Laterality: N/A;   TOE AMPUTATION Right    "big toe"   Current Outpatient Medications  Medication Sig Dispense Refill   acetaminophen (TYLENOL) 500 MG tablet Take 1,000 mg by mouth 2 (two) times daily as needed for moderate pain or headache.     Alcohol Swabs (B-D SINGLE USE SWABS REGULAR) PADS Use to check blood sugar up to 2 times daily 200 each 3   allopurinol (ZYLOPRIM) 100 MG tablet Take 1 tablet (100 mg total) by mouth daily. 90 tablet 3   aspirin 81 MG chewable tablet Chew 81 mg by mouth daily.     Blood Glucose Calibration (OT ULTRA/FASTTK CNTRL SOLN) SOLN      Blood Glucose Monitoring Suppl (ONE TOUCH ULTRA 2) w/Device KIT SMARTSIG:Via Meter     bosutinib (BOSULIF) 100 MG tablet Take 100 mg by mouth at bedtime. Take with food.     Cholecalciferol 25 MCG (1000 UT) capsule Take 1,000 Units by mouth daily.     Furosemide (FUROSCIX) 80 MG/10ML CTKT Inject 80 mg into the skin daily as needed.     gabapentin (NEURONTIN) 600 MG tablet Take 600 mg by mouth 2 (two) times daily. 270 tablet 1   glucose blood (ONETOUCH VERIO) test strip USE TO TEST BLOOD SUGAR EVERY MORNING AND EVERY NIGHT AT BEDTIME 100 strip 0   Insulin Glargine ([name] KWIKPEN) 100 UNIT/ML Inject 22 Units into the skin daily. 6 mL 0   Insulin Pen Needle 31G X 5 MM MISC Use to inject insulin daily. 100 each 3   isosorbide mononitrate (IMDUR) 60 MG 24 hr tablet Take 0.5 tablets (30 mg total) by mouth daily. 45 tablet 2   JARDIANCE 10 MG TABS tablet TAKE 1 TABLET(10 MG) BY MOUTH DAILY WITH BREAKFAST 90 tablet 1   Lancets (ONETOUCH DELICA PLUS LANCET30G) MISC Use to check blood sugar twice daily 200 each 3   levothyroxine (SYNTHROID) 25 MCG tablet TAKE 1 TABLET BY MOUTH EVERY MORNING BEFORE  BREAKFAST 90 tablet 2   losartan (COZAAR) 25 MG tablet TAKE 1/2 TABLET(12.5 MG) BY MOUTH DAILY 45 tablet  10   Menthol-Zinc Oxide (GOLD BOND EX) Apply 1 application topically daily as needed (muscle pain).     Multiple Vitamin (MULTIVITAMIN WITH MINERALS) TABS tablet Take 1 tablet by mouth daily.     nitroGLYCERIN (NITROSTAT) 0.4 MG SL tablet For chest pain, tightness, or pressure. While sitting, place 1 tablet under tongue. May be used every 5 minutes as needed, for up to 15 minutes. Do not use more than 3 tablets. 25 tablet 1   Polyethyl Glycol-Propyl Glycol (SYSTANE OP) Place 1 drop into both eyes 3 (three) times daily as needed (dry/irritated eyes.).      polyethylene glycol (MIRALAX / GLYCOLAX) 17 g packet Take 17 g by mouth daily as needed for mild constipation.      potassium chloride SA (KLOR-CON M) 20 MEQ tablet Take 20 mEq by mouth every other day.     ranolazine (RANEXA) 500 MG 12 hr tablet Take 1 tablet (500 mg total) by mouth 2 (two) times daily. 180 tablet 2   rosuvastatin (CRESTOR) 5 MG tablet TAKE 1 TABLET(5 MG) BY MOUTH DAILY 90 tablet 0   spironolactone (ALDACTONE) 25 MG tablet Take 0.5 tablets (12.5 mg total) by mouth daily. 45 tablet 3   tamsulosin (FLOMAX) 0.4 MG CAPS capsule Take 1 capsule (0.4 mg total) by mouth daily after supper. 30 capsule 11   torsemide (DEMADEX) 20 MG tablet TAKE 4 TABLETS(80 MG) BY MOUTH TWICE DAILY 240 tablet 3   traMADol (ULTRAM) 50 MG tablet Take 1-2 tablets (50-100 mg total) by mouth at bedtime as needed. 30 tablet 2   warfarin (COUMADIN) 3 MG tablet TAKE 1 TO 1 AND 1/2 TABLET BY MOUTH ONCE A DAY AS DIRECTED BY COUMADIN CLINIC 45 tablet 1   ABRYSVO 120 MCG/0.5ML injection  (Patient not taking: Reported on 07/24/2022)     Vibegron (GEMTESA) 75 MG TABS Take 1 tablet (75 mg total) by mouth daily. 42 tablet 0   No current facility-administered medications for this visit.    Allergies:   Clopidogrel, Ciprofloxacin, Beta adrenergic blockers, Mirtazapine,  and Spironolactone   Social History:  The patient  reports that he quit smoking about 49 years ago. His smoking use included cigarettes. He started smoking about 69 years ago. He has a 35.00 pack-year smoking history. He has quit using smokeless tobacco. He reports that he does not drink alcohol and does not use drugs.   Family History:  The patient's family history includes Brain cancer in his father; Diabetes in his sister; Heart attack in his sister.   ROS:  Please see the history of present illness.   All other systems are personally reviewed and negative.   Vitals:   07/24/22 1105  BP: 110/69  Pulse: 95  SpO2: 97%     02/05/2022: TSH 3.204 02/24/2022: ALT 12 07/23/2022: B Natriuretic Peptide 656.2; BUN 20; Creatinine, Ser 1.29; Hemoglobin 13.2; Platelets 233; Potassium 4.3; Sodium 138   Wt Readings from Last 3 Encounters:  07/24/22 193 lb 3.2 oz (87.6 kg)  07/22/22 196 lb (88.9 kg)  07/17/22 195 lb 4 oz (88.6 kg)    General: NAD Neck: JVP 10 cm with HJR, no thyromegaly or thyroid nodule.  Lungs: Crackles at bases.  CV: Nondisplaced PMI.  Heart regular S1/S2, no S3/S4, no murmur.  No peripheral edema.  No carotid bruit.  Normal pedal pulses.  Abdomen: Soft, nontender, no hepatosplenomegaly, no distention.  Skin: Intact without lesions or rashes.  Neurologic: Alert and oriented x 3.  Psych: Normal affect.  Extremities: No clubbing or cyanosis.  HEENT: Normal.    ASSESSMENT AND PLAN:  1. Chronic systolic HF: - likely combination of iCM/NICM - Echo (2/21): EF 30-35%  (was 35-40% in 6/20) - RHC (04/13/19): with elevated filling pressures but BNP normal  - Echo (8/22): EF 30-35%, grade I DD, RV low normal - Echo (11/23): EF 30-35% - Patient is volume overloaded by exam and Cardiomems (PADP 36).  He did well with Furoscix last week but did not realize he was supposed to go back on torsemide after he finished it.  I will give him Furoscix again today, he will take 80 mg [place] daily  for 3 days.  After that, he needs to start torsemide 80 mg bid.  BMET in 1 week.  I discussed this extensively with his wife and we wrote down instructions.   - I will see if we can get him in the paramedicine program.  - Start spironolactone 12.5 daily.   - Follow Cardiomems closely - Continue losartan 12.5 mg daily (dose decreased due to low BP).  - Continue Jardiance 10 mg daily - Followup in 1 week, admit for inpatient diuresis if not improved.   2. CAD - Cath 04/13/19 LAD 70% with +FFR otherwise diffuse non-obstructive CAD  - S/p PCI/DES to LAD 1/21 - No current angina.  Continue Ranexa and Imdur.  - Continue Crestor.   3. CKD Stage 3b - Creatinine improved recently, 1.29 yesterday.  BMET 1 week with diuretic chanages.   4. DM  - Continue Jardiance 10 mg daily. - No GU symptoms  5. PAF - Zio patch 4/21 showed no AF. Mostly NSR w/ brief SVT/ NSVT, PACs and PVCs - Remains in NSR on ECG today - Continue warfarin - No bleeding  6. Suspect dementia - At least mild, significant forgetfulness.  He cannot manage his medications and needs help from his family.  Ideally would have paramedicine.   Followup in 1 week.    Marca Ancona, MD  9:03 AM   [facility] [address] [facility] [place] [address] [phone] (office) [phone] (fax)

## 2022-07-30 ENCOUNTER — Telehealth: Payer: Self-pay

## 2022-07-30 NOTE — Telephone Encounter (Signed)
Transition Care Management Follow-up Telephone Call Date of discharge and from where: 07/23/2022 [facility] How have you been since you were released from the hospital? Patient is still not feeling better. Any questions or concerns? No  Items Reviewed: Did the pt receive and understand the discharge instructions provided? Yes  Medications obtained and verified? Yes  Other? No  Any new allergies since your discharge? No  Dietary orders reviewed? Yes Do you have support at home? Yes   Follow up appointments reviewed:  PCP Hospital f/u appt confirmed? No  Scheduled to see  on  @ . Specialist Hospital f/u appt confirmed? Yes  Scheduled to see Arvilla Meres MD on 08/02/2022 @ [facility]. Are transportation arrangements needed? No  If their condition worsens, is the pt aware to call PCP or go to the Emergency Dept.? Yes Was the patient provided with contact information for the PCP's office or ED? Yes Was to pt encouraged to call back with questions or concerns? Yes  Lucas Caldwell [facility]  [facility] Population Health Community Resource Care Guide   [email or web address]  ?? [phone]   Website: [email or web address]  [email or web address]

## 2022-08-01 ENCOUNTER — Other Ambulatory Visit: Payer: Self-pay | Admitting: Family Medicine

## 2022-08-01 ENCOUNTER — Other Ambulatory Visit: Payer: Self-pay | Admitting: Nurse Practitioner

## 2022-08-01 DIAGNOSIS — K219 Gastro-esophageal reflux disease without esophagitis: Secondary | ICD-10-CM

## 2022-08-01 DIAGNOSIS — I5023 Acute on chronic systolic (congestive) heart failure: Secondary | ICD-10-CM

## 2022-08-01 NOTE — Telephone Encounter (Signed)
Please advise if ok to refill non cardiac medication. 

## 2022-08-01 NOTE — Telephone Encounter (Signed)
Please advise If ok to refill non cardiac medication.   

## 2022-08-01 NOTE — Telephone Encounter (Signed)
Requested Prescriptions  Pending Prescriptions Disp Refills   famotidine (PEPCID) 40 MG tablet [Pharmacy Med Name: FAMOTIDINE 40MG  TABLETS] 90 tablet 0    Sig: TAKE 1 TABLET(40 MG) BY MOUTH AT BEDTIME     Gastroenterology:  H2 Antagonists Passed - 08/01/2022 12:21 PM      Passed - Valid encounter within last 12 months    Recent Outpatient Visits           1 week ago Confusion   [place] [facility] [place], [name], DO   1 month ago Controlled type 2 diabetes mellitus with diabetic nephropathy, with long-term current use of insulin [facility])   [place] [facility] [place], [name], DO   3 months ago Controlled type 2 diabetes mellitus with diabetic nephropathy, with long-term current use of insulin [facility])   [place] [facility] [place], [name], DO   5 months ago Influenza A   [place] [facility] Smitty Cords, DO   7 months ago Annual physical exam   [place] [facility] [place], [name], DO       Future Appointments             In 2 weeks Carman Ching, PA-C [facility] [place]   In 2 months [name], [name], DO [place] [facility]

## 2022-08-01 NOTE — Telephone Encounter (Signed)
Please disregard [name] I meant to send it to [facility] Nurse.

## 2022-08-02 ENCOUNTER — Encounter: Payer: Medicare HMO | Admitting: Internal Medicine

## 2022-08-02 ENCOUNTER — Ambulatory Visit (HOSPITAL_BASED_OUTPATIENT_CLINIC_OR_DEPARTMENT_OTHER): Payer: Medicare HMO | Admitting: Internal Medicine

## 2022-08-02 ENCOUNTER — Encounter: Payer: Self-pay | Admitting: Internal Medicine

## 2022-08-02 ENCOUNTER — Other Ambulatory Visit
Admission: RE | Admit: 2022-08-02 | Discharge: 2022-08-02 | Disposition: A | Discharge status: Home / Self Care | Payer: Medicare HMO | Source: Ambulatory Visit | Attending: Internal Medicine | Admitting: Internal Medicine

## 2022-08-02 VITALS — BP 122/68 | HR 96 | Wt 193.4 lb

## 2022-08-02 DIAGNOSIS — I480 Paroxysmal atrial fibrillation: Secondary | ICD-10-CM | POA: Diagnosis not present

## 2022-08-02 DIAGNOSIS — N1832 Chronic kidney disease, stage 3b: Secondary | ICD-10-CM

## 2022-08-02 DIAGNOSIS — I5022 Chronic systolic (congestive) heart failure: Secondary | ICD-10-CM | POA: Diagnosis not present

## 2022-08-02 DIAGNOSIS — I2510 Atherosclerotic heart disease of native coronary artery without angina pectoris: Secondary | ICD-10-CM

## 2022-08-02 LAB — BASIC METABOLIC PANEL
Anion gap: 9 (ref 5–15)
BUN: 31 mg/dL — ABNORMAL HIGH (ref 8–23)
CO2: 25 mmol/L (ref 22–32)
Calcium: 9.4 mg/dL (ref 8.9–10.3)
Chloride: 103 mmol/L (ref 98–111)
Creatinine, Ser: 1.6 mg/dL — ABNORMAL HIGH (ref 0.61–1.24)
GFR, Estimated: 42 mL/min — ABNORMAL LOW (ref >60)
Glucose, Bld: 166 mg/dL — ABNORMAL HIGH (ref 70–99)
Potassium: 3.9 mmol/L (ref 3.5–5.1)
Sodium: 137 mmol/L (ref 135–145)

## 2022-08-02 LAB — PROTIME-INR
INR: 2.1 — ABNORMAL HIGH (ref 0.8–1.2)
Prothrombin Time: 24.1 seconds — ABNORMAL HIGH (ref 11.4–15.2)

## 2022-08-02 LAB — CBC
HCT: 39.5 % (ref 39.0–52.0)
Hemoglobin: 12.8 g/dL — ABNORMAL LOW (ref 13.0–17.0)
MCH: 28 pg (ref 26.0–34.0)
MCHC: 32.4 g/dL (ref 30.0–36.0)
MCV: 86.4 fL (ref 80.0–100.0)
Platelets: 219 10*3/uL (ref 150–400)
RBC: 4.57 MIL/uL (ref 4.22–5.81)
RDW: 15 % (ref 11.5–15.5)
WBC: 6.2 10*3/uL (ref 4.0–10.5)
nRBC: 0 % (ref 0.0–0.2)

## 2022-08-02 LAB — BRAIN NATRIURETIC PEPTIDE: B Natriuretic Peptide: 583.6 pg/mL — ABNORMAL HIGH (ref 0.0–100.0)

## 2022-08-02 MED ORDER — TORSEMIDE 20 MG PO TABS: 100.0000 mg | ORAL_TABLET | Freq: Two times a day (BID) | ORAL | 3 refills | Status: DC

## 2022-08-02 MED ORDER — TORSEMIDE 20 MG PO TABS: 100.0000 mg | ORAL_TABLET | Freq: Every day | ORAL | 3 refills | Status: DC

## 2022-08-02 NOTE — Progress Notes (Signed)
Advanced Heart Failure Clinic Note   Date:  08/02/2022   ID:  Lucas Caldwell, DOB 1933-06-14, MRN [id number]  Location: Home  Provider location: [place] [facility] Type of Visit: Established patient   PCP:  Smitty Cords, DO  Cardiologist:  Yvonne Kendall, MD HF Cardiologist: Dr. Gala Romney  Chief Complaint:  No chief complaint on file.  Heart failure follow up   HPI: Lucas Caldwell is a 89 y.o. 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 Okey Dupre for further evaluation of his HF.     Has h/o NICM previously followed by Dr. [name] at [facility]. He had good response to medical therapy in 2012 EF was reported to be 50%    Had PCI of his RCA CAD s/p RCA stent x2 08/2016 at [facility].    Echo 6/20 LVEF 35-40%   Cardiac cath 08/2018 70% LM stenosis with abnl FFR of 0.86, though drop in iFR was due to diffuse prox LAD disease and only small gradient involving ostial LM. Imdur was increased.    Admitted 04/13/19 with chest pain and A/C systolic heart failure. Repeat Cath 1/21 LM 60% LAD 70% with +FFR otherwise diffuse non-obstructive CAD -> PCI/DES LAD 1/21. RHC: RA 10 PA 53/30 (40) PCWP 28 CO/CI 4.2/2.1 PA sat 67%.   Echo 04/22/19 EF 30-35%   Referred to [facility] 04/2019 for further management of his systolic HF. Had cardiomems implanted in 7/22   Zio 4/21 SR w/ 1st AVB, avg HR 81 bpm, 1 brief 5 beat run of NSVT, Two runs SVT   Echo 8/22 EF 30-35% RV ok   Echo 1/23 EF 30-35%  Cardiomems was running high last week and torsemide increased to 80 bid. PAD on cardiomems 27 ->-> 15 ->-> 21-> 23 (on 11/7)  Labs on 11/3 Scr 1.73 -> 1.99  Previously torsemide dropped back to 60 daily (? bid) and metolazone added every Wednesday. Did not tolerate and developed AKI. Saw Ward Givens and torsemide increased from 60 bid  to 80/60 for elevated CardioMems (20)    Seen in ED 02/24/22 with AMS and found to have flu A  Earlier this month PAD elevated on Cardiomems and torsemide increased for 2 days from 80 bid to 100 bid. But no change   Saw Dr. Shirlee Latch last week. Was improved with Furoscix but didn't restart torsemide. So Dr. Shirlee Latch gave him more Furoscix and restarted torsemide 80 bid. Here with his wife. Says he continues to be very short-winded "as bad as I have ever felt". Weight is stable. Says chest feels tight at times. SOB with any activity. + orthopnea. No PND. No edema. He has mild confusion/forgetfulness, his wife says that this has been chronic.    Cardiomems PAD 33-> 36 (goal 19)   Past Medical History:  Diagnosis Date   Arthritis    "probably in his legs" (08/15/2016)   Blind right eye    BPH (benign prostatic hyperplasia)    Breast asymmetry    Left breast is larger, present for several years.   CAD (coronary artery disease)    a. Cath in the late 90's - reportedly ok;  b. 2014 s/p stenting x 2 @ [facility]; c 08/19/16 Cath/PCI with DES -> RCA, plan to treat LM 70% medically. Seen by surgery and felt to be too high  risk for CABG; d. 08/2018 Cath: LM 70 (iFR 0.86), LAD 20ost, 40p, 50/63m, D1 70, LCX patent stent, OM1 30, RCA patent stent, 56m ISR, 40d, RPDA 30. PCWP 8. CO/CI 4.0/2.1; e. 03/2019 PCI to LAD (2.5x15 Resolute Onyx DES).   Chronic combined systolic (congestive) and diastolic (congestive) heart failure (HCC)    a. Previously reduced EF-->50% by echo in 2012;  b. 06/2015 Echo: EF 50-55%  c. 07/2016 Echo: EF 45-50%; d. 12/2017 Echo: EF 55%; e. 08/2018 Echo: EF 35-40%; f. 04/2019 Echo: EF 30-35%; g. s/p cardiomems; h. 10/2020 Echo: EF 30-35%; i. 01/2022 Echo: EF 30-35%, glob HK, GrII DD, nl rV fxn, mild to mod MR.   Chronic kidney disease (CKD), stage IV (severe) (HCC)    CML (chronic myelocytic leukemia) (HCC)    GERD (gastroesophageal reflux disease)    GIB (gastrointestinal bleeding)    a. 12/2017 3  unit PRBC GIB in setting of coumadin-->Endo/colon multiple duodenal ulcers and a single bleeding ulcer in the proximal ascending colon status post hemostatic clipping x2.   Gout    Hyperlipemia    Hypertension    Hypothyroidism    Iron deficiency anemia    Ischemic cardiomyopathy    a. Previously reduced EF-->50% by echo in 2012;  b. 06/2015 Echo: EF 50-55%, Gr2 DD;  c. 07/2016 Echo: EF 45-50%; d. 12/2017 Echo: EF 55%, Gr1 DD, mild MR; e. 08/2018 Echo: EF 35-40%; f. 04/2019 Echo: EF 30-35%; g. 10/2020 Echo: EF 30-35%; h. Echo: EF 30-35%   Migraine    "in the 1960s" (08/15/2016)   Obstructive sleep apnea    Orthostatic hypotension    a. 20222 -->carvedilol and hydralazine d/c'd.   PAF (paroxysmal atrial fibrillation) (HCC)    a. ? Dx 2014-->s/p DCCV;  b. CHA2DS2VASc = 6--> Coumadin.   Prostate cancer (HCC)    RBBB    Type II diabetes mellitus [facility])    Past Surgical History:  Procedure Laterality Date   AMPUTATION TOE Right 02/23/2021   Procedure: AMPUTATION TOE;  Surgeon: Gwyneth Revels, DPM;  Location: [facility] ORS;  Service: Podiatry;  Laterality: Right;   CATARACT EXTRACTION W/ INTRAOCULAR LENS  IMPLANT, BILATERAL Bilateral    COLONOSCOPY N/A 01/13/2018   Procedure: COLONOSCOPY;  Surgeon: Toney Reil, MD;  Location: [facility] ENDOSCOPY;  Service: Gastroenterology;  Laterality: N/A;   COLONOSCOPY WITH PROPOFOL N/A 01/01/2018   Procedure: COLONOSCOPY WITH PROPOFOL;  Surgeon: Toney Reil, MD;  Location: [facility] ENDOSCOPY;  Service: Gastroenterology;  Laterality: N/A;   CORONARY ANGIOPLASTY WITH STENT PLACEMENT  09/2012   2 stents   CORONARY STENT INTERVENTION N/A 08/19/2016   Procedure: Coronary Stent Intervention;  Surgeon: Yvonne Kendall, MD;  [facility]: [facility] INVASIVE CV LAB;  Service: Cardiovascular;  Laterality: N/A;   CORONARY STENT INTERVENTION N/A 04/13/2019   Procedure: CORONARY STENT INTERVENTION;  Surgeon: Yvonne Kendall, MD;  Location: [facility] INVASIVE CV LAB;  Service:  Cardiovascular;  Laterality: N/A;   ESOPHAGEAL MANOMETRY N/A 12/16/2017   Procedure: ESOPHAGEAL MANOMETRY (EM);  Surgeon: Toney Reil, MD;  Location: [facility] ENDOSCOPY;  Service: Gastroenterology;  Laterality: N/A;   ESOPHAGOGASTRODUODENOSCOPY N/A 12/20/2016   Procedure: ESOPHAGOGASTRODUODENOSCOPY (EGD);  Surgeon: Toney Reil, MD;  Location: [facility] ENDOSCOPY;  Service: Gastroenterology;  Laterality: N/A;   ESOPHAGOGASTRODUODENOSCOPY N/A 01/13/2018   Procedure: ESOPHAGOGASTRODUODENOSCOPY (EGD);  Surgeon: Toney Reil, MD;  Location: [facility] ENDOSCOPY;  Service: Gastroenterology;  Laterality: N/A;   ESOPHAGOGASTRODUODENOSCOPY (EGD) WITH PROPOFOL N/A 11/03/2017   Procedure: ESOPHAGOGASTRODUODENOSCOPY (EGD) WITH PROPOFOL;  Surgeon: Lannette Donath  Betti Cruz, MD;  Location: [facility] ENDOSCOPY;  Service: Gastroenterology;  Laterality: N/A;   EYE SURGERY     HERNIA REPAIR     "navel"   LEFT HEART CATH AND CORONARY ANGIOGRAPHY N/A 08/14/2016   Procedure: Left Heart Cath and Coronary Angiography;  Surgeon: Yvonne Kendall, MD;  Location: [facility] INVASIVE CV LAB;  Service: Cardiovascular;  Laterality: N/A;   PRESSURE SENSOR/CARDIOMEMS N/A 09/23/2019   Procedure: PRESSURE SENSOR/CARDIOMEMS;  Surgeon: Dolores Patty, MD;  Location: [facility] INVASIVE CV LAB;  Service: Cardiovascular;  Laterality: N/A;   RIGHT HEART CATH N/A 09/23/2019   Procedure: RIGHT HEART CATH;  Surgeon: Dolores Patty, MD;  Location: [facility] INVASIVE CV LAB;  Service: Cardiovascular;  Laterality: N/A;   RIGHT/LEFT HEART CATH AND CORONARY ANGIOGRAPHY N/A 09/01/2018   Procedure: RIGHT/LEFT HEART CATH AND CORONARY ANGIOGRAPHY;  Surgeon: Yvonne Kendall, MD;  Location: [facility] INVASIVE CV LAB;  Service: Cardiovascular;  Laterality: N/A;   RIGHT/LEFT HEART CATH AND CORONARY ANGIOGRAPHY N/A 04/13/2019   Procedure: RIGHT/LEFT HEART CATH AND CORONARY ANGIOGRAPHY;  Surgeon: Yvonne Kendall, MD;  Location: [facility] INVASIVE CV LAB;  Service: Cardiovascular;   Laterality: N/A;   TOE AMPUTATION Right    "big toe"   Current Outpatient Medications  Medication Sig Dispense Refill   acetaminophen (TYLENOL) 500 MG tablet Take 1,000 mg by mouth 2 (two) times daily as needed for moderate pain or headache.     Alcohol Swabs (B-D SINGLE USE SWABS REGULAR) PADS Use to check blood sugar up to 2 times daily 200 each 3   allopurinol (ZYLOPRIM) 100 MG tablet TAKE 1 TABLET(100 MG) BY MOUTH DAILY 90 tablet 0   aspirin 81 MG chewable tablet Chew 81 mg by mouth daily.     Blood Glucose Calibration (OT ULTRA/FASTTK CNTRL SOLN) SOLN      Blood Glucose Monitoring Suppl (ONE TOUCH ULTRA 2) w/Device KIT SMARTSIG:Via Meter     bosutinib (BOSULIF) 100 MG tablet Take 100 mg by mouth at bedtime. Take with food.     Cholecalciferol 25 MCG (1000 UT) capsule Take 1,000 Units by mouth daily.     Furosemide (FUROSCIX) 80 MG/10ML CTKT Inject 80 mg into the skin daily as needed.     gabapentin (NEURONTIN) 600 MG tablet Take 600 mg by mouth 2 (two) times daily. 270 tablet 1   glucose blood (ONETOUCH VERIO) test strip USE TO TEST BLOOD SUGAR EVERY MORNING AND EVERY NIGHT AT BEDTIME 100 strip 0   Insulin Glargine ([name] KWIKPEN) 100 UNIT/ML Inject 22 Units into the skin daily. 6 mL 0   Insulin Pen Needle 31G X 5 MM MISC Use to inject insulin daily. 100 each 3   isosorbide mononitrate (IMDUR) 60 MG 24 hr tablet Take 0.5 tablets (30 mg total) by mouth daily. 45 tablet 2   JARDIANCE 10 MG TABS tablet TAKE 1 TABLET(10 MG) BY MOUTH DAILY WITH BREAKFAST 90 tablet 1   Lancets (ONETOUCH DELICA PLUS LANCET30G) MISC Use to check blood sugar twice daily 200 each 3   levothyroxine (SYNTHROID) 25 MCG tablet TAKE 1 TABLET BY MOUTH EVERY MORNING BEFORE BREAKFAST 90 tablet 2   losartan (COZAAR) 25 MG tablet TAKE 1/2 TABLET(12.5 MG) BY MOUTH DAILY 45 tablet 10   Menthol-Zinc Oxide (GOLD BOND EX) Apply 1 application topically daily as needed (muscle pain).     Multiple Vitamin (MULTIVITAMIN WITH  MINERALS) TABS tablet Take 1 tablet by mouth daily.     nitroGLYCERIN (NITROSTAT) 0.4 MG SL tablet For chest pain, tightness, or  pressure. While sitting, place 1 tablet under tongue. May be used every 5 minutes as needed, for up to 15 minutes. Do not use more than 3 tablets. 25 tablet 1   Polyethyl Glycol-Propyl Glycol (SYSTANE OP) Place 1 drop into both eyes 3 (three) times daily as needed (dry/irritated eyes.).      polyethylene glycol (MIRALAX / GLYCOLAX) 17 g packet Take 17 g by mouth daily as needed for mild constipation.      potassium chloride SA (KLOR-CON M) 20 MEQ tablet Take 20 mEq by mouth every other day.     ranolazine (RANEXA) 500 MG 12 hr tablet Take 1 tablet (500 mg total) by mouth 2 (two) times daily. 180 tablet 2   rosuvastatin (CRESTOR) 5 MG tablet TAKE 1 TABLET(5 MG) BY MOUTH DAILY 90 tablet 0   spironolactone (ALDACTONE) 25 MG tablet Take 0.5 tablets (12.5 mg total) by mouth daily. 45 tablet 3   tamsulosin (FLOMAX) 0.4 MG CAPS capsule Take 1 capsule (0.4 mg total) by mouth daily after supper. 30 capsule 11   torsemide (DEMADEX) 20 MG tablet TAKE 4 TABLETS(80 MG) BY MOUTH TWICE DAILY 240 tablet 3   traMADol (ULTRAM) 50 MG tablet Take 1-2 tablets (50-100 mg total) by mouth at bedtime as needed. 30 tablet 2   Vibegron (GEMTESA) 75 MG TABS Take 1 tablet (75 mg total) by mouth daily. 42 tablet 0   warfarin (COUMADIN) 3 MG tablet TAKE 1 TO 1 AND 1/2 TABLET BY MOUTH ONCE A DAY AS DIRECTED BY COUMADIN CLINIC 45 tablet 1   ABRYSVO 120 MCG/0.5ML injection  (Patient not taking: Reported on 07/24/2022)     No current facility-administered medications for this visit.    Allergies:   Clopidogrel, Ciprofloxacin, Beta adrenergic blockers, Mirtazapine, and Spironolactone   Social History:  The patient  reports that he quit smoking about 49 years ago. His smoking use included cigarettes. He started smoking about 69 years ago. He has a 35.00 pack-year smoking history. He has quit using smokeless  tobacco. He reports that he does not drink alcohol and does not use drugs.   Family History:  The patient's family history includes Brain cancer in his father; Diabetes in his sister; Heart attack in his sister.   ROS:  Please see the history of present illness.   All other systems are personally reviewed and negative.   BP 130/74 (BP Location: Right Arm, Patient Position: Sitting)   Pulse 88   Resp 16   Ht 5\' 4"  (1.626 m)   Wt 193 lb 4 oz (87.7 kg)   SpO2 99%   BMI 33.17 kg/m   Exam:   General:  Elderly. No resp difficulty HEENT: normal Neck: supple. Hard to see jvp . Carotids 2+ bilat; no bruits. No lymphadenopathy or thryomegaly appreciated. Cor: PMI nondisplaced. Regular rate & rhythm. No rubs, gallops or murmurs. Lungs: clear Abdomen: soft, nontender, nondistended. No hepatosplenomegaly. No bruits or masses. Good bowel sounds. Extremities: no cyanosis, clubbing, rash, edema Neuro: alert & orientedx3, cranial nerves grossly intact. moves all 4 extremities w/o difficulty. Affect pleasant but anxious   02/05/2022: TSH 3.204 02/24/2022: ALT 12 07/23/2022: B Natriuretic Peptide 656.2; BUN 20; Creatinine, Ser 1.29; Hemoglobin 13.2; Platelets 233; Potassium 4.3; Sodium 138   Wt Readings from Last 3 Encounters:  08/02/22 193 lb 6.4 oz (87.7 kg)  07/24/22 193 lb 3.2 oz (87.6 kg)  07/22/22 196 lb (88.9 kg)     ASSESSMENT AND PLAN:  1. Chronic systolic HF: -  likely combination of iCM/NICM - Echo (2/21): EF 30-35%  (was 35-40% in 6/20) - RHC (04/13/19): with elevated filling pressures but BNP normal  - Echo (8/22): EF 30-35%, grade I DD, RV low normal - Echo 11/23 EF 30-35% - Continue to struggle with NYHA IIIB-IV - On exam volume status doesn't appear too bad but Cardiomems remains markedly elevated - Increase torsemide from 80 bid to 100 bid. 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  -  Continue losartan 12.5 mg daily (dose decreased due to low BP).  - Continue Jardiance 10 mg daily - Plan R/L cath to further evaluate next week - check labs - Not candidate for advanced therapies with age, CKD and progressive memory issues.  2. CAD - Cath 04/13/19 LAD 70% with +FFR otherwise diffuse non-obstructive CAD  - S/p PCI/DES to LAD 1/21 - Followed by Dr. Okey Dupre - Has occasional chest tightness which I suspect is related to HF. Will do coronary angio at cath next week with limited dye if SCr ok  - Continue medical management.  3. CKD Stage 3b - baseline SCr ~1.7-2.0 - labs today - Avoid hypotension.  4. DM  - Continue Jardiance 10 mg daily. - No GU symptoms  5. PAF - Zio patch 4/21 showed no AF. Mostly NSR w/ brief SVT/ NSVT, PACs and PVCs - Remains in NSR on ECG today - Continue warfarin - No bleeding  Total time spent 45 minutes. Over half that time spent discussing above and arranging cath.     Arvilla Meres, MD  12:02 PM   [facility] [address] Heart and Vascular [place] [address] [phone] (office) [phone] (fax)

## 2022-08-02 NOTE — Patient Instructions (Addendum)
Take Furoscix for three days  INCREASE Torsemide to 100mg  twice daily   PLEASE SEE CATH INSTRUCTIONS  Routine lab work today. Will notify you of abnormal results  Follow up in 6 weeks  Do the following things EVERYDAY: Weigh yourself in the morning before breakfast. Write it down and keep it in a log. Take your medicines as prescribed Eat low salt foods--Limit salt (sodium) to 2000 mg per day.  Stay as active as you can everyday Limit all fluids for the day to less than 2 liters      You are scheduled for Cardiac Catheterization on Friday May 24th  with Dr. Gala Romney.  Please arrive at the [facility] at 10:00 a.m.  on the day of your procedure.  1. DIET __X_ Nothing to eat or drink after midnight except your medications with a sip of water.   2. MAKE SURE YOU TAKE YOUR ASPIRIN.  3. __X_ DO NOT TAKE these medications before your procedure: Torsemide, any diabetic medications, NO COUMADIN [facility] 5/22 restart after your cath      _X__ YOU MAY TAKE ALL of your remaining medications with a small amount of water.     4. Plan for one night stay - bring personal belongings (i.e. toothpaste, toothbrush, etc.)  5. Bring a current list of your medications and current insurance cards.  6. Must have a responsible person to drive you home.  7. Someone must be with you for the first 24 hours after you arrive home.  8. Please wear clothes that are easy to get on and off and wear slip-on shoes.  * Special note: Every effort is made to have your procedure done on time. Occasionally there are emergencies that present themselves at the hospital that may cause delays. Please be patient if a delay does occur.  If you have any questions after you get home, please call the office at the number listed above.

## 2022-08-03 DIAGNOSIS — E1121 Type 2 diabetes mellitus with diabetic nephropathy: Secondary | ICD-10-CM | POA: Diagnosis not present

## 2022-08-05 ENCOUNTER — Other Ambulatory Visit: Payer: Self-pay | Admitting: Family Medicine

## 2022-08-05 DIAGNOSIS — K219 Gastro-esophageal reflux disease without esophagitis: Secondary | ICD-10-CM

## 2022-08-06 ENCOUNTER — Other Ambulatory Visit: Payer: Self-pay | Admitting: Family Medicine

## 2022-08-06 DIAGNOSIS — E1121 Type 2 diabetes mellitus with diabetic nephropathy: Secondary | ICD-10-CM

## 2022-08-06 NOTE — Telephone Encounter (Signed)
Requested Prescriptions  Pending Prescriptions Disp Refills   JARDIANCE 10 MG TABS tablet [Pharmacy Med Name: JARDIANCE 10MG  TABLETS] 90 tablet 0    Sig: TAKE 1 TABLET(10 MG) BY MOUTH DAILY WITH BREAKFAST     Endocrinology:  Diabetes - SGLT2 Inhibitors Failed - 08/06/2022  6:28 AM      Failed - Cr in normal range and within 360 days    Creat  Date Value Ref Range Status  12/05/2021 1.96 (H) 0.70 - 1.22 mg/dL Final   Creatinine, Ser  Date Value Ref Range Status  08/02/2022 1.60 (H) 0.61 - 1.24 mg/dL Final   Creatinine, Urine  Date Value Ref Range Status  12/18/2016 96 mg/dL Final         Failed - eGFR in normal range and within 360 days    GFR, Est African American  Date Value Ref Range Status  04/13/2020 41 (L) > OR = 60 mL/min/1.59m2 Final   GFR, Est Non African American  Date Value Ref Range Status  04/13/2020 36 (L) > OR = 60 mL/min/1.8m2 Final   GFR, Estimated  Date Value Ref Range Status  08/02/2022 42 (L) >60 mL/min Final    Comment:    (NOTE) Calculated using the CKD-EPI Creatinine Equation (2021)    eGFR  Date Value Ref Range Status  12/05/2021 33 (L) > OR = 60 mL/min/1.92m2 Final  05/16/2021 55 (L) >59 mL/min/1.73 Final         Passed - HBA1C is between 0 and 7.9 and within 180 days    Hemoglobin A1C  Date Value Ref Range Status  06/05/2022 7.5 (A) 4.0 - 5.6 % Final   Hgb A1c MFr Bld  Date Value Ref Range Status  12/05/2021 8.0 (H) <5.7 % of total Hgb Final    Comment:    For someone without known diabetes, a hemoglobin A1c value of 6.5% or greater indicates that they may have  diabetes and this should be confirmed with a follow-up  test. . For someone with known diabetes, a value <7% indicates  that their diabetes is well controlled and a value  greater than or equal to 7% indicates suboptimal  control. A1c targets should be individualized based on  duration of diabetes, age, comorbid conditions, and  other considerations. . Currently, no  consensus exists regarding use of hemoglobin A1c for diagnosis of diabetes for children. [name] - Valid encounter within last 6 months    Recent Outpatient Visits           2 weeks ago Confusion   [place] [facility] [place], [name], DO   2 months ago Controlled type 2 diabetes mellitus with diabetic nephropathy, with long-term current use of insulin [facility])   [place] [facility], [name], DO   3 months ago Controlled type 2 diabetes mellitus with diabetic nephropathy, with long-term current use of insulin [facility])   [place] [facility] [place], [name], DO   5 months ago Influenza A   [place] Smitty Cords, DO   8 months ago Annual physical exam   [place] [facility] Smitty Cords, DO       Future Appointments             In 2 weeks Carman Ching, PA-C [facility]   In 1 month Smitty Cords, DO  [place] [facility], [place]

## 2022-08-06 NOTE — Telephone Encounter (Signed)
Requested Prescriptions  Refused Prescriptions Disp Refills   famotidine (PEPCID) 40 MG tablet [Pharmacy Med Name: FAMOTIDINE 40MG  TABLETS] 180 tablet 0    Sig: TAKE 1 TABLET BY MOUTH TWICE DAILY WITH A MEAL     Gastroenterology:  H2 Antagonists Passed - 08/05/2022  3:41 PM      Passed - Valid encounter within last 12 months    Recent Outpatient Visits           2 weeks ago Confusion   [place] [facility] [place], [name], DO   2 months ago Controlled type 2 diabetes mellitus with diabetic nephropathy, with long-term current use of insulin [facility])   [place] [facility] [place], [name], DO   3 months ago Controlled type 2 diabetes mellitus with diabetic nephropathy, with long-term current use of insulin [facility])   [facility] [place], [name], DO   5 months ago Influenza A   [place] [facility] Smitty Cords, DO   8 months ago Annual physical exam   [place] [facility] [place], [name], DO       Future Appointments             In 2 weeks Carman Ching, PA-C [facility] [place]   In 1 month [name], [name], DO [place] [facility]

## 2022-08-07 ENCOUNTER — Other Ambulatory Visit
Admission: RE | Admit: 2022-08-07 | Discharge: 2022-08-07 | Disposition: A | Discharge status: Home / Self Care | Payer: Medicare HMO | Source: Ambulatory Visit | Attending: Cardiology | Admitting: Cardiology

## 2022-08-07 ENCOUNTER — Encounter: Payer: Self-pay | Admitting: Cardiology

## 2022-08-07 ENCOUNTER — Encounter: Payer: Medicare HMO | Admitting: Cardiology

## 2022-08-07 ENCOUNTER — Ambulatory Visit (HOSPITAL_BASED_OUTPATIENT_CLINIC_OR_DEPARTMENT_OTHER): Payer: Medicare HMO | Admitting: Cardiology

## 2022-08-07 VITALS — BP 108/72 | HR 98 | Wt 196.0 lb

## 2022-08-07 DIAGNOSIS — I5022 Chronic systolic (congestive) heart failure: Secondary | ICD-10-CM | POA: Diagnosis not present

## 2022-08-07 LAB — BASIC METABOLIC PANEL
Anion gap: 11 (ref 5–15)
BUN: 32 mg/dL — ABNORMAL HIGH (ref 8–23)
CO2: 24 mmol/L (ref 22–32)
Calcium: 8.8 mg/dL — ABNORMAL LOW (ref 8.9–10.3)
Chloride: 97 mmol/L — ABNORMAL LOW (ref 98–111)
Creatinine, Ser: 1.87 mg/dL — ABNORMAL HIGH (ref 0.61–1.24)
GFR, Estimated: 35 mL/min — ABNORMAL LOW (ref >60)
Glucose, Bld: 180 mg/dL — ABNORMAL HIGH (ref 70–99)
Potassium: 3.6 mmol/L (ref 3.5–5.1)
Sodium: 132 mmol/L — ABNORMAL LOW (ref 135–145)

## 2022-08-07 LAB — BRAIN NATRIURETIC PEPTIDE: B Natriuretic Peptide: 710.9 pg/mL — ABNORMAL HIGH (ref 0.0–100.0)

## 2022-08-07 NOTE — Progress Notes (Unsigned)
Advanced [facility] Clinic Note   Date:  08/07/2022   ID:  Lucas Caldwell, DOB 06-13-1933, MRN [id number]  Location: Home  Provider location: [place] [facility] Type of Visit: Established patient   PCP:  Smitty Cords, DO  Cardiologist:  Yvonne Kendall, MD HF Cardiologist: Dr. Gala Romney  Chief Complaint:  No chief complaint on file.  Heart failure follow up   HPI: Lucas Caldwell is a 89 y.o. 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 Okey Dupre for further evaluation of his HF.     Has h/o NICM previously followed by Dr. [name] at [facility]. He had good response to medical therapy in 2012 EF was reported to be 50%    Had PCI of his RCA CAD s/p RCA stent x2 08/2016 at [facility].    Echo 6/20 LVEF 35-40%   Cardiac cath 08/2018 70% LM stenosis with abnl FFR of 0.86, though drop in iFR was due to diffuse prox LAD disease and only small gradient involving ostial LM. Imdur was increased.    Admitted 04/13/19 with chest pain and A/C systolic heart failure. Repeat Cath 1/21 LM 60% LAD 70% with +FFR otherwise diffuse non-obstructive CAD -> PCI/DES LAD 1/21. RHC: RA 10 PA 53/30 (40) PCWP 28 CO/CI 4.2/2.1 PA sat 67%.   Echo 04/22/19 EF 30-35%   Referred to [facility] 04/2019 for further management of his systolic HF. Had cardiomems implanted in 7/22   Zio 4/21 SR w/ 1st AVB, avg HR 81 bpm, 1 brief 5 beat run of NSVT, Two runs SVT   Echo 8/22 EF 30-35% RV ok   Echo 1/23 EF 30-35%  Cardiomems was running high last week and torsemide increased to 80 bid. PAD on cardiomems 27 ->-> 15 ->-> 21-> 23 (on 11/7)  Labs on 11/3 Scr 1.73 -> 1.99  Previously torsemide dropped back to 60 daily (? bid) and metolazone added every Wednesday. Did not tolerate and developed AKI. Saw Ward Givens and torsemide increased from 60 bid  to 80/60 for elevated CardioMems (20)    Seen in ED 02/24/22 with AMS and found to have flu A  Earlier this month PAD elevated on Cardiomems and torsemide increased for 2 days from 80 bid to 100 bid. But no change   Saw Dr. Shirlee Latch last week. Was improved with Furoscix but didn't restart torsemide. So Dr. Shirlee Latch gave him more Furoscix and restarted torsemide 80 bid. Here with his wife. Says he continues to be very short-winded "as bad as I have ever felt". Weight is stable. Says chest feels tight at times. SOB with any activity. + orthopnea. No PND. No edema. He has mild confusion/forgetfulness, his wife says that this has been chronic.    Cardiomems PAD 33-> 36 (goal 19)   Past Medical History:  Diagnosis Date   Arthritis    "probably in his legs" (08/15/2016)   Blind right eye    BPH (benign prostatic hyperplasia)    Breast asymmetry    Left breast is larger, present for several years.   CAD (coronary artery disease)    a. Cath in the late 90's - reportedly ok;  b. 2014 s/p stenting x 2 @ [facility]; c 08/19/16 Cath/PCI with DES -> RCA, plan to treat LM 70% medically. Seen by surgery and felt to be too high  risk for CABG; d. 08/2018 Cath: LM 70 (iFR 0.86), LAD 20ost, 40p, 50/33m, D1 70, LCX patent stent, OM1 30, RCA patent stent, 38m ISR, 40d, RPDA 30. PCWP 8. CO/CI 4.0/2.1; e. 03/2019 PCI to LAD (2.5x15 Resolute Onyx DES).   Chronic combined systolic (congestive) and diastolic (congestive) heart failure (HCC)    a. Previously reduced EF-->50% by echo in 2012;  b. 06/2015 Echo: EF 50-55%  c. 07/2016 Echo: EF 45-50%; d. 12/2017 Echo: EF 55%; e. 08/2018 Echo: EF 35-40%; f. 04/2019 Echo: EF 30-35%; g. s/p cardiomems; h. 10/2020 Echo: EF 30-35%; i. 01/2022 Echo: EF 30-35%, glob HK, GrII DD, nl rV fxn, mild to mod MR.   Chronic kidney disease (CKD), stage IV (severe) (HCC)    CML (chronic myelocytic leukemia) (HCC)    GERD (gastroesophageal reflux disease)    GIB (gastrointestinal bleeding)    a. 12/2017 3  unit PRBC GIB in setting of coumadin-->Endo/colon multiple duodenal ulcers and a single bleeding ulcer in the proximal ascending colon status post hemostatic clipping x2.   Gout    Hyperlipemia    Hypertension    Hypothyroidism    Iron deficiency anemia    Ischemic cardiomyopathy    a. Previously reduced EF-->50% by echo in 2012;  b. 06/2015 Echo: EF 50-55%, Gr2 DD;  c. 07/2016 Echo: EF 45-50%; d. 12/2017 Echo: EF 55%, Gr1 DD, mild MR; e. 08/2018 Echo: EF 35-40%; f. 04/2019 Echo: EF 30-35%; g. 10/2020 Echo: EF 30-35%; h. Echo: EF 30-35%   Migraine    "in the 1960s" (08/15/2016)   Obstructive sleep apnea    Orthostatic hypotension    a. 20222 -->carvedilol and hydralazine d/c'd.   PAF (paroxysmal atrial fibrillation) (HCC)    a. ? Dx 2014-->s/p DCCV;  b. CHA2DS2VASc = 6--> Coumadin.   Prostate cancer (HCC)    RBBB    Type II diabetes mellitus [facility])    Past Surgical History:  Procedure Laterality Date   AMPUTATION TOE Right 02/23/2021   Procedure: AMPUTATION TOE;  Surgeon: Gwyneth Revels, DPM;  Location: [facility] ORS;  Service: Podiatry;  Laterality: Right;   CATARACT EXTRACTION W/ INTRAOCULAR LENS  IMPLANT, BILATERAL Bilateral    COLONOSCOPY N/A 01/13/2018   Procedure: COLONOSCOPY;  Surgeon: Toney Reil, MD;  Location: [facility]COPY;  Service: Gastroenterology;  Laterality: N/A;   COLONOSCOPY WITH PROPOFOL N/A 01/01/2018   Procedure: COLONOSCOPY WITH PROPOFOL;  Surgeon: Toney Reil, MD;  Location: [facility] ENDOSCOPY;  Service: Gastroenterology;  Laterality: N/A;   CORONARY ANGIOPLASTY WITH STENT PLACEMENT  09/2012   2 stents   CORONARY STENT INTERVENTION N/A 08/19/2016   Procedure: Coronary Stent Intervention;  Surgeon: Yvonne Kendall, MD;  Location: [facility] INVASIVE CV LAB;  Service: Cardiovascular;  Laterality: N/A;   CORONARY STENT INTERVENTION N/A 04/13/2019   Procedure: CORONARY STENT INTERVENTION;  Surgeon: Yvonne Kendall, MD;  Location: [facility] INVASIVE CV LAB;  Service:  Cardiovascular;  Laterality: N/A;   ESOPHAGEAL MANOMETRY N/A 12/16/2017   Procedure: ESOPHAGEAL MANOMETRY (EM);  Surgeon: Toney Reil, MD;  Location: [facility] ENDOSCOPY;  Service: Gastroenterology;  Laterality: N/A;   ESOPHAGOGASTRODUODENOSCOPY N/A 12/20/2016   Procedure: ESOPHAGOGASTRODUODENOSCOPY (EGD);  Surgeon: Toney Reil, MD;  Location[facility] END[facility]COPY;  Service: Gastroenterology;  Laterality: N/A;   ESOPHAGOGASTRODUODENOSCOPY N/A 01/13/2018   Procedure: ESOPHAGOGASTRODUODENOSCOPY (EGD);  Surgeon: Toney Reil, MD;  Location: [facility] ENDOSCOPY;  Service: Gastroenterology;  Laterality: N/A;   ESOPHAGOGASTRODUODENOSCOPY (EGD) WITH PROPOFOL N/A 11/03/2017   Procedure: ESOPHAGOGASTRODUODENOSCOPY (EGD) WITH PROPOFOL;  Surgeon: Lannette Donath  Betti Cruz, MD;  Location: [facility] ENDOSCOPY;  Service: Gastroenterology;  Laterality: N/A;   EYE SURGERY     HERNIA REPAIR     "navel"   LEFT HEART CATH AND CORONARY ANGIOGRAPHY N/A 08/14/2016   Procedure: Left Heart Cath and Coronary Angiography;  Surgeon: Yvonne Kendall, MD;  Location: [facility] INVASIVE CV LAB;  Service: Cardiovascular;  Laterality: N/A;   PRESSURE SENSOR/CARDIOMEMS N/A 09/23/2019   Procedure: PRESSURE SENSOR/CARDIOMEMS;  Surgeon: Dolores Patty, MD;  Location: [facility] INVASIVE CV LAB;  Service: Cardiovascular;  Laterality: N/A;   RIGHT HEART CATH N/A 09/23/2019   Procedure: RIGHT HEART CATH;  Surgeon: Dolores Patty, MD;  Location: [facility] INVASIVE CV LAB;  Service: Cardiovascular;  Laterality: N/A;   RIGHT/LEFT HEART CATH AND CORONARY ANGIOGRAPHY N/A 09/01/2018   Procedure: RIGHT/LEFT HEART CATH AND CORONARY ANGIOGRAPHY;  Surgeon: Yvonne Kendall, MD;  Location: [facility] INVASIVE CV LAB;  Service: Cardiovascular;  Laterality: N/A;   RIGHT/LEFT HEART CATH AND CORONARY ANGIOGRAPHY N/A 04/13/2019   Procedure: RIGHT/LEFT HEART CATH AND CORONARY ANGIOGRAPHY;  Surgeon: Yvonne Kendall, MD;  Location: [facility] INVASIVE CV LAB;  Service: Cardiovascular;   Laterality: N/A;   TOE AMPUTATION Right    "big toe"   Current Outpatient Medications  Medication Sig Dispense Refill   acetaminophen (TYLENOL) 500 MG tablet Take 1,000 mg by mouth 2 (two) times daily as needed for moderate pain or headache.     Alcohol Swabs (B-D SINGLE USE SWABS REGULAR) PADS Use to check blood sugar up to 2 times daily 200 each 3   allopurinol (ZYLOPRIM) 100 MG tablet TAKE 1 TABLET(100 MG) BY MOUTH DAILY 90 tablet 0   aspirin 81 MG chewable tablet Chew 81 mg by mouth daily.     Blood Glucose Calibration (OT ULTRA/FASTTK CNTRL SOLN) SOLN      Blood Glucose Monitoring Suppl (ONE TOUCH ULTRA 2) w/Device KIT SMARTSIG:Via Meter     bosutinib (BOSULIF) 100 MG tablet Take 100 mg by mouth at bedtime. Take with food.     Cholecalciferol 25 MCG (1000 UT) capsule Take 1,000 Units by mouth daily.     Furosemide (FUROSCIX) 80 MG/10ML CTKT Inject 80 mg into the skin daily as needed.     gabapentin (NEURONTIN) 600 MG tablet Take 600 mg by mouth 2 (two) times daily. 270 tablet 1   glucose blood (ONETOUCH VERIO) test strip USE TO TEST BLOOD SUGAR EVERY MORNING AND EVERY NIGHT AT BEDTIME 100 strip 0   Insulin Glargine ([name] KWIKPEN) 100 UNIT/ML Inject 22 Units into the skin daily. 6 mL 0   Insulin Pen Needle 31G X 5 MM MISC Use to inject insulin daily. 100 each 3   isosorbide mononitrate (IMDUR) 60 MG 24 hr tablet Take 0.5 tablets (30 mg total) by mouth daily. 45 tablet 2   JARDIANCE 10 MG TABS tablet TAKE 1 TABLET(10 MG) BY MOUTH DAILY WITH BREAKFAST 90 tablet 0   Lancets (ONETOUCH DELICA PLUS LANCET30G) MISC Use to check blood sugar twice daily 200 each 3   levothyroxine (SYNTHROID) 25 MCG tablet TAKE 1 TABLET BY MOUTH EVERY MORNING BEFORE BREAKFAST 90 tablet 2   losartan (COZAAR) 25 MG tablet TAKE 1/2 TABLET(12.5 MG) BY MOUTH DAILY 45 tablet 10   Menthol-Zinc Oxide (GOLD BOND EX) Apply 1 application topically daily as needed (muscle pain).     Multiple Vitamin (MULTIVITAMIN WITH  MINERALS) TABS tablet Take 1 tablet by mouth daily.     nitroGLYCERIN (NITROSTAT) 0.4 MG SL tablet For chest pain, tightness, or  pressure. While sitting, place 1 tablet under tongue. May be used every 5 minutes as needed, for up to 15 minutes. Do not use more than 3 tablets. 25 tablet 1   Polyethyl Glycol-Propyl Glycol (SYSTANE OP) Place 1 drop into both eyes 3 (three) times daily as needed (dry/irritated eyes.).      polyethylene glycol (MIRALAX / GLYCOLAX) 17 g packet Take 17 g by mouth daily as needed for mild constipation.      potassium chloride SA (KLOR-CON M) 20 MEQ tablet Take 20 mEq by mouth every other day.     ranolazine (RANEXA) 500 MG 12 hr tablet Take 1 tablet (500 mg total) by mouth 2 (two) times daily. 180 tablet 2   rosuvastatin (CRESTOR) 5 MG tablet TAKE 1 TABLET(5 MG) BY MOUTH DAILY 90 tablet 0   spironolactone (ALDACTONE) 25 MG tablet Take 0.5 tablets (12.5 mg total) by mouth daily. 45 tablet 3   tamsulosin (FLOMAX) 0.4 MG CAPS capsule Take 1 capsule (0.4 mg total) by mouth daily after supper. 30 capsule 11   torsemide (DEMADEX) 20 MG tablet Take 5 tablets (100 mg total) by mouth 2 (two) times daily. 240 tablet 3   traMADol (ULTRAM) 50 MG tablet Take 1-2 tablets (50-100 mg total) by mouth at bedtime as needed. 30 tablet 2   Vibegron (GEMTESA) 75 MG TABS Take 1 tablet (75 mg total) by mouth daily. 42 tablet 0   warfarin (COUMADIN) 3 MG tablet TAKE 1 TO 1 AND 1/2 TABLET BY MOUTH ONCE A DAY AS DIRECTED BY COUMADIN CLINIC 45 tablet 1   ABRYSVO 120 MCG/0.5ML injection  (Patient not taking: Reported on 07/24/2022)     No current facility-administered medications for this visit.    Allergies:   Clopidogrel, Ciprofloxacin, Beta adrenergic blockers, Mirtazapine, and Spironolactone   Social History:  The patient  reports that he quit smoking about 49 years ago. His smoking use included cigarettes. He started smoking about 69 years ago. He has a 35.00 pack-year smoking history. He has quit  using smokeless tobacco. He reports that he does not drink alcohol and does not use drugs.   Family History:  The patient's family history includes Brain cancer in his father; Diabetes in his sister; Heart attack in his sister.   ROS:  Please see the history of present illness.   All other systems are personally reviewed and negative.   BP 130/74 (BP Location: Right Arm, Patient Position: Sitting)   Pulse 88   Resp 16   Ht 5\' 4"  (1.626 m)   Wt 193 lb 4 oz (87.7 kg)   SpO2 99%   BMI 33.17 kg/m   Exam:   General:  Elderly. No resp difficulty HEENT: normal Neck: supple. Hard to see jvp . Carotids 2+ bilat; no bruits. No lymphadenopathy or thryomegaly appreciated. Cor: PMI nondisplaced. Regular rate & rhythm. No rubs, gallops or murmurs. Lungs: clear Abdomen: soft, nontender, nondistended. No hepatosplenomegaly. No bruits or masses. Good bowel sounds. Extremities: no cyanosis, clubbing, rash, edema Neuro: alert & orientedx3, cranial nerves grossly intact. moves all 4 extremities w/o difficulty. Affect pleasant but anxious   02/05/2022: TSH 3.204 02/24/2022: ALT 12 08/02/2022: B Natriuretic Peptide 583.6; BUN 31; Creatinine, Ser 1.60; Hemoglobin 12.8; Platelets 219; Potassium 3.9; Sodium 137   Wt Readings from Last 3 Encounters:  08/07/22 196 lb (88.9 kg)  08/02/22 193 lb 6.4 oz (87.7 kg)  07/24/22 193 lb 3.2 oz (87.6 kg)     ASSESSMENT AND PLAN:  1.  Chronic systolic HF: - likely combination of iCM/NICM - Echo (2/21): EF 30-35%  (was 35-40% in 6/20) - RHC (04/13/19): with elevated filling pressures but BNP normal  - Echo (8/22): EF 30-35%, grade I DD, RV low normal - Echo 11/23 EF 30-35% - Continue to struggle with NYHA IIIB-IV - On exam volume status doesn't appear too bad but Cardiomems remains markedly elevated - Increase torsemide from 80 bid to 100 bid. - Use Furoscix x 3 days to replace am dose of torsemide - Continue Toprol 25 mg daily (dose reduced due to  trifascicular block).  - Continue losartan 12.5 mg daily (dose decreased due to low BP).  - Continue Jardiance 10 mg daily - Plan R/L cath to further evaluate next week - check labs - Not candidate for advanced therapies with age, CKD and progressive memory issues.  2. CAD - Cath 04/13/19 LAD 70% with +FFR otherwise diffuse non-obstructive CAD  - S/p PCI/DES to LAD 1/21 - Followed by Dr. Okey Dupre - Has occasional chest tightness which I suspect is related to HF. Will do coronary angio at cath next week with limited dye if SCr ok  - Continue medical management.  3. CKD Stage 3b - baseline SCr ~1.7-2.0 - labs today - Avoid hypotension.  4. DM  - Continue Jardiance 10 mg daily. - No GU symptoms  5. PAF - Zio patch 4/21 showed no AF. Mostly NSR w/ brief SVT/ NSVT, PACs and PVCs - Remains in NSR on ECG today - Continue warfarin - No bleeding  Total time spent 45 minutes. Over half that time spent discussing above and arranging cath.     Dorthula Nettles, DO  10:59 AM   [facility] [address] [facility] [place] [address] [phone] (office) [phone] (fax)

## 2022-08-07 NOTE — H&P (View-Only) (Signed)
   Advanced Heart Failure Clinic Note   Date:  08/07/2022   ID:  Lucas Caldwell, DOB 10/07/1933, MRN [id number]  Location: Home  Provider location: [place] [facility] Type of Visit: Established patient   PCP:  Karamalegos, Alexander J, DO  Cardiologist:  Christopher End, MD HF Cardiologist: Dr. Bensimhon  Chief Complaint:  No chief complaint on file.  Heart failure follow up   HPI: Lucas Caldwell is a 89 y.o. 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 End for further evaluation of his HF.     Has h/o NICM previously followed by Dr. Kirkwood Adams at [facility]. He had good response to medical therapy in 2012 EF was reported to be 50%    Had PCI of his RCA CAD s/p RCA stent x2 08/2016 at [facility].    Echo 6/20 LVEF 35-40%   Cardiac cath 08/2018 70% LM stenosis with abnl FFR of 0.86, though drop in iFR was due to diffuse prox LAD disease and only small gradient involving ostial LM. Imdur was increased.    Admitted 04/13/19 with chest pain and A/C systolic heart failure. Repeat Cath 1/21 LM 60% LAD 70% with +FFR otherwise diffuse non-obstructive CAD -> PCI/DES LAD 1/21. RHC: RA 10 PA 53/30 (40) PCWP 28 CO/CI 4.2/2.1 PA sat 67%.   Echo 04/22/19 EF 30-35%   Referred to [facility] 04/2019 for further management of his systolic HF. Had cardiomems implanted in 7/22   Zio 4/21 SR w/ 1st AVB, avg HR 81 bpm, 1 brief 5 beat run of NSVT, Two runs SVT   Echo 8/22 EF 30-35% RV ok   Echo 1/23 EF 30-35%  Cardiomems was running high last week and torsemide increased to 80 bid. PAD on cardiomems 27 ->-> 15 ->-> 21-> 23 (on 11/7)  Labs on 11/3 Scr 1.73 -> 1.99  Previously torsemide dropped back to 60 daily (? bid) and metolazone added every Wednesday. Did not tolerate and developed AKI. Saw Chris Berge and torsemide increased from 60 bid  to 80/60 for elevated CardioMems (20)    Seen in ED 02/24/22 with AMS and found to have flu A  Earlier this month PAD elevated on Cardiomems and torsemide increased for 2 days from 80 bid to 100 bid. But no change   Saw Dr. Mclean last week. Was improved with Furoscix but didn't restart torsemide. So Dr. Mclean gave him more Furoscix and restarted torsemide 80 bid. Here with his wife. Says he continues to be very short-winded "as bad as I have ever felt". Weight is stable. Says chest feels tight at times. SOB with any activity. + orthopnea. No PND. No edema. He has mild confusion/forgetfulness, his wife says that this has been chronic.    Cardiomems PAD 33-> 36 (goal 19)   Past Medical History:  Diagnosis Date   Arthritis    "probably in his legs" (08/15/2016)   Blind right eye    BPH (benign prostatic hyperplasia)    Breast asymmetry    Left breast is larger, present for several years.   CAD (coronary artery disease)    a. Cath in the late 90's - reportedly ok;  b. 2014 s/p stenting x 2 @ [facility]; c 08/19/16 Cath/PCI with DES -> RCA, plan to treat LM 70% medically. Seen by surgery and felt to be too high   risk for CABG; d. 08/2018 Cath: LM 70 (iFR 0.86), LAD 20ost, 40p, 50/60m, D1 70, LCX patent stent, OM1 30, RCA patent stent, 40m ISR, 40d, RPDA 30. PCWP 8. CO/CI 4.0/2.1; e. 03/2019 PCI to LAD (2.5x15 Resolute Onyx DES).   Chronic combined systolic (congestive) and diastolic (congestive) heart failure (HCC)    a. Previously reduced EF-->50% by echo in 2012;  b. 06/2015 Echo: EF 50-55%  c. 07/2016 Echo: EF 45-50%; d. 12/2017 Echo: EF 55%; e. 08/2018 Echo: EF 35-40%; f. 04/2019 Echo: EF 30-35%; g. s/p cardiomems; h. 10/2020 Echo: EF 30-35%; i. 01/2022 Echo: EF 30-35%, glob HK, GrII DD, nl rV fxn, mild to mod MR.   Chronic kidney disease (CKD), stage IV (severe) (HCC)    CML (chronic myelocytic leukemia) (HCC)    GERD (gastroesophageal reflux disease)    GIB (gastrointestinal bleeding)    a. 12/2017 3  unit PRBC GIB in setting of coumadin-->Endo/colon multiple duodenal ulcers and a single bleeding ulcer in the proximal ascending colon status post hemostatic clipping x2.   Gout    Hyperlipemia    Hypertension    Hypothyroidism    Iron deficiency anemia    Ischemic cardiomyopathy    a. Previously reduced EF-->50% by echo in 2012;  b. 06/2015 Echo: EF 50-55%, Gr2 DD;  c. 07/2016 Echo: EF 45-50%; d. 12/2017 Echo: EF 55%, Gr1 DD, mild MR; e. 08/2018 Echo: EF 35-40%; f. 04/2019 Echo: EF 30-35%; g. 10/2020 Echo: EF 30-35%; h. Echo: EF 30-35%   Migraine    "in the 1960s" (08/15/2016)   Obstructive sleep apnea    Orthostatic hypotension    a. 20222 -->carvedilol and hydralazine d/c'd.   PAF (paroxysmal atrial fibrillation) (HCC)    a. ? Dx 2014-->s/p DCCV;  b. CHA2DS2VASc = 6--> Coumadin.   Prostate cancer (HCC)    RBBB    Type II diabetes mellitus (HCC)    Past Surgical History:  Procedure Laterality Date   AMPUTATION TOE Right 02/23/2021   Procedure: AMPUTATION TOE;  Surgeon: Fowler, Justin, DPM;  Location: [facility];  Service: Podiatry;  Laterality: Right;   CATARACT EXTRACTION W/ INTRAOCULAR LENS  IMPLANT, BILATERAL Bilateral    COLONOSCOPY N/A 01/13/2018   Procedure: COLONOSCOPY;  Surgeon: Vanga, Rohini Reddy, MD;  Location: [facility];  Service: Gastroenterology;  Laterality: N/A;   COLONOSCOPY WITH PROPOFOL N/A 01/01/2018   Procedure: COLONOSCOPY WITH PROPOFOL;  Surgeon: Vanga, Rohini Reddy, MD;  Location: [facility];  Service: Gastroenterology;  Laterality: N/A;   CORONARY ANGIOPLASTY WITH STENT PLACEMENT  09/2012   2 stents   CORONARY STENT INTERVENTION N/A 08/19/2016   Procedure: Coronary Stent Intervention;  Surgeon: End, Christopher, MD;  [facility];  Service: Cardiovascular;  Laterality: N/A;   CORONARY STENT INTERVENTION N/A 04/13/2019   Procedure: CORONARY STENT INTERVENTION;  Surgeon: End, Christopher, MD;  Location: [facility];  Service:  Cardiovascular;  Laterality: N/A;   ESOPHAGEAL MANOMETRY N/A 12/16/2017   Procedure: ESOPHAGEAL MANOMETRY (EM);  Surgeon: Vanga, Rohini Reddy, MD;  Location: [facility];  Service: Gastroenterology;  Laterality: N/A;   ESOPHAGOGASTRODUODENOSCOPY N/A 12/20/2016   Procedure: ESOPHAGOGASTRODUODENOSCOPY (EGD);  Surgeon: Vanga, Rohini Reddy, MD;  Location: [facility];  Service: Gastroenterology;  Laterality: N/A;   ESOPHAGOGASTRODUODENOSCOPY N/A 01/13/2018   Procedure: ESOPHAGOGASTRODUODENOSCOPY (EGD);  Surgeon: Vanga, Rohini Reddy, MD;  Location: [facility];  Service: Gastroenterology;  Laterality: N/A;   ESOPHAGOGASTRODUODENOSCOPY (EGD) WITH PROPOFOL N/A 11/03/2017   Procedure: ESOPHAGOGASTRODUODENOSCOPY (EGD) WITH PROPOFOL;  Surgeon: Vanga, Rohini   Reddy, MD;  Location: [facility];  Service: Gastroenterology;  Laterality: N/A;   EYE SURGERY     HERNIA REPAIR     "navel"   LEFT HEART CATH AND CORONARY ANGIOGRAPHY N/A 08/14/2016   Procedure: Left Heart Cath and Coronary Angiography;  Surgeon: End, Christopher, MD;  Location: [facility];  Service: Cardiovascular;  Laterality: N/A;   PRESSURE SENSOR/CARDIOMEMS N/A 09/23/2019   Procedure: PRESSURE SENSOR/CARDIOMEMS;  Surgeon: Bensimhon, Daniel R, MD;  [facility];  Service: Cardiovascular;  Laterality: N/A;   RIGHT HEART CATH N/A 09/23/2019   Procedure: RIGHT HEART CATH;  Surgeon: Bensimhon, Daniel R, MD;  [facility];  Service: Cardiovascular;  Laterality: N/A;   RIGHT/LEFT HEART CATH AND CORONARY ANGIOGRAPHY N/A 09/01/2018   Procedure: RIGHT/LEFT HEART CATH AND CORONARY ANGIOGRAPHY;  Surgeon: End, Christopher, MD;  Location: [facility];  Service: Cardiovascular;  Laterality: N/A;   RIGHT/LEFT HEART CATH AND CORONARY ANGIOGRAPHY N/A 04/13/2019   Procedure: RIGHT/LEFT HEART CATH AND CORONARY ANGIOGRAPHY;  Surgeon: End, Christopher, MD;  Location: [facility];  Service: Cardiovascular;   Laterality: N/A;   TOE AMPUTATION Right    "big toe"   Current Outpatient Medications  Medication Sig Dispense Refill   acetaminophen (TYLENOL) 500 MG tablet Take 1,000 mg by mouth 2 (two) times daily as needed for moderate pain or headache.     Alcohol Swabs (B-D SINGLE USE SWABS REGULAR) PADS Use to check blood sugar up to 2 times daily 200 each 3   allopurinol (ZYLOPRIM) 100 MG tablet TAKE 1 TABLET(100 MG) BY MOUTH DAILY 90 tablet 0   aspirin 81 MG chewable tablet Chew 81 mg by mouth daily.     Blood Glucose Calibration (OT ULTRA/FASTTK CNTRL SOLN) SOLN      Blood Glucose Monitoring Suppl (ONE TOUCH ULTRA 2) w/Device KIT SMARTSIG:Via Meter     bosutinib (BOSULIF) 100 MG tablet Take 100 mg by mouth at bedtime. Take with food.     Cholecalciferol 25 MCG (1000 UT) capsule Take 1,000 Units by mouth daily.     Furosemide (FUROSCIX) 80 MG/10ML CTKT Inject 80 mg into the skin daily as needed.     gabapentin (NEURONTIN) 600 MG tablet Take 600 mg by mouth 2 (two) times daily. 270 tablet 1   glucose blood (ONETOUCH VERIO) test strip USE TO TEST BLOOD SUGAR EVERY MORNING AND EVERY NIGHT AT BEDTIME 100 strip 0   Insulin Glargine ([name] KWIKPEN) 100 UNIT/ML Inject 22 Units into the skin daily. 6 mL 0   Insulin Pen Needle 31G X 5 MM MISC Use to inject insulin daily. 100 each 3   isosorbide mononitrate (IMDUR) 60 MG 24 hr tablet Take 0.5 tablets (30 mg total) by mouth daily. 45 tablet 2   JARDIANCE 10 MG TABS tablet TAKE 1 TABLET(10 MG) BY MOUTH DAILY WITH BREAKFAST 90 tablet 0   Lancets (ONETOUCH DELICA PLUS LANCET30G) MISC Use to check blood sugar twice daily 200 each 3   levothyroxine (SYNTHROID) 25 MCG tablet TAKE 1 TABLET BY MOUTH EVERY MORNING BEFORE BREAKFAST 90 tablet 2   losartan (COZAAR) 25 MG tablet TAKE 1/2 TABLET(12.5 MG) BY MOUTH DAILY 45 tablet 10   Menthol-Zinc Oxide (GOLD BOND EX) Apply 1 application topically daily as needed (muscle pain).     Multiple Vitamin (MULTIVITAMIN WITH  MINERALS) TABS tablet Take 1 tablet by mouth daily.     nitroGLYCERIN (NITROSTAT) 0.4 MG SL tablet For chest pain, tightness, or   pressure. While sitting, place 1 tablet under tongue. May be used every 5 minutes as needed, for up to 15 minutes. Do not use more than 3 tablets. 25 tablet 1   Polyethyl Glycol-Propyl Glycol (SYSTANE OP) Place 1 drop into both eyes 3 (three) times daily as needed (dry/irritated eyes.).      polyethylene glycol (MIRALAX / GLYCOLAX) 17 g packet Take 17 g by mouth daily as needed for mild constipation.      potassium chloride SA (KLOR-CON M) 20 MEQ tablet Take 20 mEq by mouth every other day.     ranolazine (RANEXA) 500 MG 12 hr tablet Take 1 tablet (500 mg total) by mouth 2 (two) times daily. 180 tablet 2   rosuvastatin (CRESTOR) 5 MG tablet TAKE 1 TABLET(5 MG) BY MOUTH DAILY 90 tablet 0   spironolactone (ALDACTONE) 25 MG tablet Take 0.5 tablets (12.5 mg total) by mouth daily. 45 tablet 3   tamsulosin (FLOMAX) 0.4 MG CAPS capsule Take 1 capsule (0.4 mg total) by mouth daily after supper. 30 capsule 11   torsemide (DEMADEX) 20 MG tablet Take 5 tablets (100 mg total) by mouth 2 (two) times daily. 240 tablet 3   traMADol (ULTRAM) 50 MG tablet Take 1-2 tablets (50-100 mg total) by mouth at bedtime as needed. 30 tablet 2   Vibegron (GEMTESA) 75 MG TABS Take 1 tablet (75 mg total) by mouth daily. 42 tablet 0   warfarin (COUMADIN) 3 MG tablet TAKE 1 TO 1 AND 1/2 TABLET BY MOUTH ONCE A DAY AS DIRECTED BY COUMADIN CLINIC 45 tablet 1   ABRYSVO 120 MCG/0.5ML injection  (Patient not taking: Reported on 07/24/2022)     No current facility-administered medications for this visit.    Allergies:   Clopidogrel, Ciprofloxacin, Beta adrenergic blockers, Mirtazapine, and Spironolactone   Social History:  The patient  reports that he quit smoking about 49 years ago. His smoking use included cigarettes. He started smoking about 69 years ago. He has a 35.00 pack-year smoking history. He has quit  using smokeless tobacco. He reports that he does not drink alcohol and does not use drugs.   Family History:  The patient's family history includes Brain cancer in his father; Diabetes in his sister; Heart attack in his sister.   ROS:  Please see the history of present illness.   All other systems are personally reviewed and negative.   BP 130/74 (BP Location: Right Arm, Patient Position: Sitting)   Pulse 88   Resp 16   Ht 5' 4" (1.626 m)   Wt 193 lb 4 oz (87.7 kg)   SpO2 99%   BMI 33.17 kg/m   Exam:   General:  Elderly. No resp difficulty HEENT: normal Neck: supple. Hard to see jvp . Carotids 2+ bilat; no bruits. No lymphadenopathy or thryomegaly appreciated. Cor: PMI nondisplaced. Regular rate & rhythm. No rubs, gallops or murmurs. Lungs: clear Abdomen: soft, nontender, nondistended. No hepatosplenomegaly. No bruits or masses. Good bowel sounds. Extremities: no cyanosis, clubbing, rash, edema Neuro: alert & orientedx3, cranial nerves grossly intact. moves all 4 extremities w/o difficulty. Affect pleasant but anxious   02/05/2022: TSH 3.204 02/24/2022: ALT 12 08/02/2022: B Natriuretic Peptide 583.6; BUN 31; Creatinine, Ser 1.60; Hemoglobin 12.8; Platelets 219; Potassium 3.9; Sodium 137   Wt Readings from Last 3 Encounters:  08/07/22 196 lb (88.9 kg)  08/02/22 193 lb 6.4 oz (87.7 kg)  07/24/22 193 lb 3.2 oz (87.6 kg)     ASSESSMENT AND PLAN:  1.   Chronic systolic HF: - likely combination of iCM/NICM - Echo (2/21): EF 30-35%  (was 35-40% in 6/20) - RHC (04/13/19): with elevated filling pressures but BNP normal  - Echo (8/22): EF 30-35%, grade I DD, RV low normal - Echo 11/23 EF 30-35% - Continue to struggle with NYHA IIIB-IV - On exam volume status doesn't appear too bad but Cardiomems remains markedly elevated - Increase torsemide from 80 bid to 100 bid. - Use Furoscix x 3 days to replace am dose of torsemide - Continue Toprol 25 mg daily (dose reduced due to  trifascicular block).  - Continue losartan 12.5 mg daily (dose decreased due to low BP).  - Continue Jardiance 10 mg daily - Plan R/L cath to further evaluate next week - check labs - Not candidate for advanced therapies with age, CKD and progressive memory issues.  2. CAD - Cath 04/13/19 LAD 70% with +FFR otherwise diffuse non-obstructive CAD  - S/p PCI/DES to LAD 1/21 - Followed by Dr. End - Has occasional chest tightness which I suspect is related to HF. Will do coronary angio at cath next week with limited dye if SCr ok  - Continue medical management.  3. CKD Stage 3b - baseline SCr ~1.7-2.0 - labs today - Avoid hypotension.  4. DM  - Continue Jardiance 10 mg daily. - No GU symptoms  5. PAF - Zio patch 4/21 showed no AF. Mostly NSR w/ brief SVT/ NSVT, PACs and PVCs - Remains in NSR on ECG today - Continue warfarin - No bleeding  Total time spent 45 minutes. Over half that time spent discussing above and arranging cath.     Antion Andres, DO  10:59 AM   [facility] [address] [facility] [place] [address] [phone] (office) [phone] (fax) 

## 2022-08-08 ENCOUNTER — Telehealth: Payer: Self-pay | Admitting: Family Medicine

## 2022-08-08 ENCOUNTER — Other Ambulatory Visit: Payer: Self-pay | Admitting: Family Medicine

## 2022-08-08 DIAGNOSIS — E1121 Type 2 diabetes mellitus with diabetic nephropathy: Secondary | ICD-10-CM

## 2022-08-08 DIAGNOSIS — E1142 Type 2 diabetes mellitus with diabetic polyneuropathy: Secondary | ICD-10-CM

## 2022-08-08 MED ORDER — GABAPENTIN 600 MG PO TABS
600.0000 mg | ORAL_TABLET | Freq: Two times a day (BID) | ORAL | 1 refills | Status: DC
Start: 2022-08-08 — End: 2022-08-13

## 2022-08-08 NOTE — Telephone Encounter (Signed)
Medication Refill - Medication: gabapentin (NEURONTIN) 600 MG tablet [[id number]]   Reporting that the patient is out of medication  Has the patient contacted their pharmacy? Yes.   (Agent: If no, request that the patient contact the pharmacy for the refill. If patient does not wish to contact the pharmacy document the reason why and proceed with request.) (Agent: If yes, when and what did the pharmacy advise?)  Preferred Pharmacy (with phone number or street name):  [id number] [place], [place] - [address] AT [facility] Phone: [phone]  Fax: [phone]     Has the patient been seen for an appointment in the last year OR does the patient have an upcoming appointment? Yes.    Agent: Please be advised that RX refills may take up to 3 business days. We ask that you follow-up with your pharmacy.

## 2022-08-08 NOTE — Telephone Encounter (Signed)
Robert the pharmacist at [facility] called in with questions about the patients gabapentin (NEURONTIN) 600 MG tablet  He states the patient told him he spoke with his provider who told him it was ok to take more than what was prescribed if necessary. Please assist further

## 2022-08-08 NOTE — Telephone Encounter (Signed)
The max dose for Gabapentin 600mg  is one pill twice a day. Or 2 pills in 24 hours.  The conversation we had previously was when I thought he was still taking 300mg  pills,  Gentry Fitz has already discussed and confirmed this with them back in March 2024.  At this time, based on kidney function, the max dose is 2 of the 600mg  doses in one day.  [name], DO [facility] 08/08/2022, 4:43 PM

## 2022-08-08 NOTE — Telephone Encounter (Signed)
Labs in date  Requested Prescriptions  Pending Prescriptions Disp Refills   gabapentin (NEURONTIN) 600 MG tablet 270 tablet 1    Sig: Take 1 tablet (600 mg total) by mouth 2 (two) times daily.     Neurology: Anticonvulsants - gabapentin Failed - 08/08/2022  3:41 PM      Failed - Cr in normal range and within 360 days    Creat  Date Value Ref Range Status  12/05/2021 1.96 (H) 0.70 - 1.22 mg/dL Final   Creatinine, Ser  Date Value Ref Range Status  08/07/2022 1.87 (H) 0.61 - 1.24 mg/dL Final   Creatinine, Urine  Date Value Ref Range Status  12/18/2016 96 mg/dL Final         Passed - Completed PHQ-2 or PHQ-9 in the last 360 days      Passed - Valid encounter within last 12 months    Recent Outpatient Visits           2 weeks ago Confusion   [place] [facility] [place], [name], DO   2 months ago Controlled type 2 diabetes mellitus with diabetic nephropathy, with long-term current use of insulin [facility])   [place] [facility] [place], [name], DO   3 months ago Controlled type 2 diabetes mellitus with diabetic nephropathy, with long-term current use of insulin [facility])   [place] [facility] [place], [name], DO   5 months ago Influenza A   [place] [place] Cords, DO   8 months ago Annual physical exam   [facility] [place], [name], DO       Future Appointments             In 1 week Carman Ching, PA-C [facility] [place]   In 1 month [name], [name], DO [place] [name], [facility]

## 2022-08-09 ENCOUNTER — Other Ambulatory Visit: Payer: Self-pay

## 2022-08-09 ENCOUNTER — Inpatient Hospital Stay (HOSPITAL_COMMUNITY)
Admission: AD | Admit: 2022-08-09 | Discharge: 2022-08-13 | DRG: 286 | Disposition: A | Discharge status: Hospice | Payer: Medicare HMO | Attending: Internal Medicine | Admitting: Internal Medicine

## 2022-08-09 ENCOUNTER — Encounter (HOSPITAL_COMMUNITY)
Admission: AD | Disposition: A | Discharge status: Hospice | Payer: Self-pay | Source: Home / Self Care | Attending: Internal Medicine

## 2022-08-09 DIAGNOSIS — Z66 Do not resuscitate: Secondary | ICD-10-CM | POA: Diagnosis not present

## 2022-08-09 DIAGNOSIS — F05 Delirium due to known physiological condition: Secondary | ICD-10-CM | POA: Diagnosis not present

## 2022-08-09 DIAGNOSIS — Z87891 Personal history of nicotine dependence: Secondary | ICD-10-CM

## 2022-08-09 DIAGNOSIS — I255 Ischemic cardiomyopathy: Secondary | ICD-10-CM | POA: Diagnosis not present

## 2022-08-09 DIAGNOSIS — N179 Acute kidney failure, unspecified: Secondary | ICD-10-CM | POA: Diagnosis not present

## 2022-08-09 DIAGNOSIS — I2510 Atherosclerotic heart disease of native coronary artery without angina pectoris: Secondary | ICD-10-CM | POA: Diagnosis not present

## 2022-08-09 DIAGNOSIS — Z888 Allergy status to other drugs, medicaments and biological substances status: Secondary | ICD-10-CM

## 2022-08-09 DIAGNOSIS — N184 Chronic kidney disease, stage 4 (severe): Secondary | ICD-10-CM | POA: Diagnosis present

## 2022-08-09 DIAGNOSIS — M109 Gout, unspecified: Secondary | ICD-10-CM | POA: Diagnosis present

## 2022-08-09 DIAGNOSIS — E785 Hyperlipidemia, unspecified: Secondary | ICD-10-CM | POA: Diagnosis present

## 2022-08-09 DIAGNOSIS — Z8249 Family history of ischemic heart disease and other diseases of the circulatory system: Secondary | ICD-10-CM

## 2022-08-09 DIAGNOSIS — Z833 Family history of diabetes mellitus: Secondary | ICD-10-CM

## 2022-08-09 DIAGNOSIS — Z515 Encounter for palliative care: Secondary | ICD-10-CM

## 2022-08-09 DIAGNOSIS — I130 Hypertensive heart and chronic kidney disease with heart failure and stage 1 through stage 4 chronic kidney disease, or unspecified chronic kidney disease: Secondary | ICD-10-CM | POA: Diagnosis not present

## 2022-08-09 DIAGNOSIS — Z89421 Acquired absence of other right toe(s): Secondary | ICD-10-CM

## 2022-08-09 DIAGNOSIS — Z881 Allergy status to other antibiotic agents status: Secondary | ICD-10-CM | POA: Diagnosis not present

## 2022-08-09 DIAGNOSIS — Z79899 Other long term (current) drug therapy: Secondary | ICD-10-CM

## 2022-08-09 DIAGNOSIS — M1990 Unspecified osteoarthritis, unspecified site: Secondary | ICD-10-CM | POA: Diagnosis present

## 2022-08-09 DIAGNOSIS — E1122 Type 2 diabetes mellitus with diabetic chronic kidney disease: Secondary | ICD-10-CM | POA: Diagnosis present

## 2022-08-09 DIAGNOSIS — I5023 Acute on chronic systolic (congestive) heart failure: Secondary | ICD-10-CM | POA: Diagnosis not present

## 2022-08-09 DIAGNOSIS — Z79890 Hormone replacement therapy (postmenopausal): Secondary | ICD-10-CM

## 2022-08-09 DIAGNOSIS — Z7984 Long term (current) use of oral hypoglycemic drugs: Secondary | ICD-10-CM

## 2022-08-09 DIAGNOSIS — D509 Iron deficiency anemia, unspecified: Secondary | ICD-10-CM | POA: Diagnosis present

## 2022-08-09 DIAGNOSIS — C9310 Chronic myelomonocytic leukemia not having achieved remission: Secondary | ICD-10-CM | POA: Diagnosis present

## 2022-08-09 DIAGNOSIS — F0392 Unspecified dementia, unspecified severity, with psychotic disturbance: Secondary | ICD-10-CM | POA: Diagnosis not present

## 2022-08-09 DIAGNOSIS — E1142 Type 2 diabetes mellitus with diabetic polyneuropathy: Secondary | ICD-10-CM | POA: Diagnosis present

## 2022-08-09 DIAGNOSIS — K219 Gastro-esophageal reflux disease without esophagitis: Secondary | ICD-10-CM | POA: Diagnosis present

## 2022-08-09 DIAGNOSIS — E039 Hypothyroidism, unspecified: Secondary | ICD-10-CM | POA: Diagnosis present

## 2022-08-09 DIAGNOSIS — N400 Enlarged prostate without lower urinary tract symptoms: Secondary | ICD-10-CM | POA: Diagnosis not present

## 2022-08-09 DIAGNOSIS — Z7901 Long term (current) use of anticoagulants: Secondary | ICD-10-CM

## 2022-08-09 DIAGNOSIS — Z955 Presence of coronary angioplasty implant and graft: Secondary | ICD-10-CM

## 2022-08-09 DIAGNOSIS — Z8546 Personal history of malignant neoplasm of prostate: Secondary | ICD-10-CM

## 2022-08-09 DIAGNOSIS — Z7982 Long term (current) use of aspirin: Secondary | ICD-10-CM

## 2022-08-09 DIAGNOSIS — H5461 Unqualified visual loss, right eye, normal vision left eye: Secondary | ICD-10-CM | POA: Diagnosis present

## 2022-08-09 DIAGNOSIS — Z951 Presence of aortocoronary bypass graft: Secondary | ICD-10-CM

## 2022-08-09 DIAGNOSIS — R0602 Shortness of breath: Secondary | ICD-10-CM | POA: Diagnosis not present

## 2022-08-09 DIAGNOSIS — Z808 Family history of malignant neoplasm of other organs or systems: Secondary | ICD-10-CM

## 2022-08-09 DIAGNOSIS — I480 Paroxysmal atrial fibrillation: Secondary | ICD-10-CM | POA: Diagnosis not present

## 2022-08-09 DIAGNOSIS — I428 Other cardiomyopathies: Secondary | ICD-10-CM | POA: Diagnosis present

## 2022-08-09 DIAGNOSIS — R5383 Other fatigue: Secondary | ICD-10-CM | POA: Diagnosis present

## 2022-08-09 DIAGNOSIS — G4733 Obstructive sleep apnea (adult) (pediatric): Secondary | ICD-10-CM | POA: Diagnosis present

## 2022-08-09 DIAGNOSIS — I453 Trifascicular block: Secondary | ICD-10-CM | POA: Diagnosis not present

## 2022-08-09 DIAGNOSIS — Z794 Long term (current) use of insulin: Secondary | ICD-10-CM | POA: Diagnosis not present

## 2022-08-09 DIAGNOSIS — Z7189 Other specified counseling: Secondary | ICD-10-CM | POA: Diagnosis not present

## 2022-08-09 HISTORY — PX: RIGHT/LEFT HEART CATH AND CORONARY ANGIOGRAPHY: CATH118266

## 2022-08-09 LAB — GLUCOSE, CAPILLARY
Glucose-Capillary: 71 mg/dL (ref 70–99)
Glucose-Capillary: 121 mg/dL — ABNORMAL HIGH (ref 70–99)
Glucose-Capillary: 88 mg/dL (ref 70–99)

## 2022-08-09 LAB — POCT I-STAT EG7
Acid-Base Excess: 1 mmol/L (ref 0.0–2.0)
Acid-Base Excess: 2 mmol/L (ref 0.0–2.0)
Bicarbonate: 25.2 mmol/L (ref 20.0–28.0)
Bicarbonate: 26.6 mmol/L (ref 20.0–28.0)
Calcium, Ion: 1.08 mmol/L — ABNORMAL LOW (ref 1.15–1.40)
Calcium, Ion: 1.15 mmol/L (ref 1.15–1.40)
HCT: 37 % — ABNORMAL LOW (ref 39.0–52.0)
HCT: 39 % (ref 39.0–52.0)
Hemoglobin: 12.6 g/dL — ABNORMAL LOW (ref 13.0–17.0)
Hemoglobin: 13.3 g/dL (ref 13.0–17.0)
O2 Saturation: 60 %
O2 Saturation: 61 %
Potassium: 3.9 mmol/L (ref 3.5–5.1)
Potassium: 4.1 mmol/L (ref 3.5–5.1)
Sodium: 136 mmol/L (ref 135–145)
Sodium: 137 mmol/L (ref 135–145)
TCO2: 26 mmol/L (ref 22–32)
TCO2: 28 mmol/L (ref 22–32)
pCO2, Ven: 40 mmHg — ABNORMAL LOW (ref 44–60)
pCO2, Ven: 41.4 mmHg — ABNORMAL LOW (ref 44–60)
pH, Ven: 7.408 (ref 7.25–7.43)
pH, Ven: 7.415 (ref 7.25–7.43)
pO2, Ven: 31 mmHg — CL (ref 32–45)
pO2, Ven: 31 mmHg — CL (ref 32–45)

## 2022-08-09 LAB — PROTIME-INR
INR: 1.9 — ABNORMAL HIGH (ref 0.8–1.2)
Prothrombin Time: 22.3 seconds — ABNORMAL HIGH (ref 11.4–15.2)

## 2022-08-09 LAB — POCT I-STAT 7, (LYTES, BLD GAS, ICA,H+H)
Acid-base deficit: 1 mmol/L (ref 0.0–2.0)
Bicarbonate: 22.6 mmol/L (ref 20.0–28.0)
Calcium, Ion: 1.11 mmol/L — ABNORMAL LOW (ref 1.15–1.40)
HCT: 37 % — ABNORMAL LOW (ref 39.0–52.0)
Hemoglobin: 12.6 g/dL — ABNORMAL LOW (ref 13.0–17.0)
O2 Saturation: 97 %
Potassium: 4 mmol/L (ref 3.5–5.1)
Sodium: 136 mmol/L (ref 135–145)
TCO2: 24 mmol/L (ref 22–32)
pCO2 arterial: 33 mmHg (ref 32–48)
pH, Arterial: 7.444 (ref 7.35–7.45)
pO2, Arterial: 85 mmHg (ref 83–108)

## 2022-08-09 LAB — MRSA NEXT GEN BY PCR, NASAL: MRSA by PCR Next Gen: NOT DETECTED

## 2022-08-09 SURGERY — RIGHT/LEFT HEART CATH AND CORONARY ANGIOGRAPHY
Anesthesia: LOCAL

## 2022-08-09 MED ORDER — HEPARIN SODIUM (PORCINE) 1000 UNIT/ML IJ SOLN
INTRAMUSCULAR | Status: DC | PRN
  Administered 2022-08-09: 4000 [IU] via INTRAVENOUS

## 2022-08-09 MED ORDER — SODIUM CHLORIDE 0.9 % IV SOLN: 250.0000 mL | INTRAVENOUS | Status: DC | PRN

## 2022-08-09 MED ORDER — ACETAMINOPHEN 325 MG PO TABS
650.0000 mg | ORAL_TABLET | ORAL | Status: DC | PRN
  Administered 2022-08-09: 650 mg via ORAL
  Filled 2022-08-09: qty 2

## 2022-08-09 MED ORDER — MILRINONE LACTATE IN DEXTROSE 20-5 MG/100ML-% IV SOLN
0.1250 ug/kg/min | INTRAVENOUS | Status: DC
  Administered 2022-08-09 – 2022-08-10 (×2): 0.125 ug/kg/min via INTRAVENOUS
  Filled 2022-08-09 (×2): qty 100

## 2022-08-09 MED ORDER — CHLORHEXIDINE GLUCONATE CLOTH 2 % EX PADS
6.0000 | MEDICATED_PAD | Freq: Once | CUTANEOUS | Status: AC
  Administered 2022-08-09: 6 via TOPICAL

## 2022-08-09 MED ORDER — RANOLAZINE ER 500 MG PO TB12
500.0000 mg | ORAL_TABLET | Freq: Two times a day (BID) | ORAL | Status: DC
  Administered 2022-08-09 – 2022-08-10 (×4): 500 mg via ORAL
  Filled 2022-08-09 (×4): qty 1

## 2022-08-09 MED ORDER — ACETAMINOPHEN 325 MG PO TABS: 650.0000 mg | ORAL_TABLET | ORAL | Status: DC | PRN

## 2022-08-09 MED ORDER — DEXTROSE 50 % IV SOLN: 25.0000 mL | Freq: Once | INTRAVENOUS | Status: AC

## 2022-08-09 MED ORDER — MILRINONE LACTATE IN DEXTROSE 20-5 MG/100ML-% IV SOLN: 0.1250 ug/kg/min | INTRAVENOUS | Status: DC

## 2022-08-09 MED ORDER — ASPIRIN 81 MG PO CHEW: 81.0000 mg | CHEWABLE_TABLET | ORAL | Status: AC

## 2022-08-09 MED ORDER — WARFARIN - PHARMACIST DOSING INPATIENT: Freq: Every day | Status: DC

## 2022-08-09 MED ORDER — VERAPAMIL HCL 2.5 MG/ML IV SOLN
INTRAVENOUS | Status: AC
  Filled 2022-08-09: qty 2

## 2022-08-09 MED ORDER — POTASSIUM CHLORIDE CRYS ER 20 MEQ PO TBCR
40.0000 meq | EXTENDED_RELEASE_TABLET | Freq: Once | ORAL | Status: AC
  Administered 2022-08-09: 40 meq via ORAL
  Filled 2022-08-09: qty 2

## 2022-08-09 MED ORDER — LIDOCAINE HCL (PF) 1 % IJ SOLN
INTRAMUSCULAR | Status: DC | PRN
  Administered 2022-08-09 (×2): 2 mL
  Administered 2022-08-09: 1 mL

## 2022-08-09 MED ORDER — WARFARIN SODIUM 4 MG PO TABS
4.5000 mg | ORAL_TABLET | Freq: Once | ORAL | Status: AC
  Administered 2022-08-09: 4.5 mg via ORAL
  Filled 2022-08-09: qty 1

## 2022-08-09 MED ORDER — ONDANSETRON HCL 4 MG/2ML IJ SOLN: 4.0000 mg | Freq: Four times a day (QID) | INTRAMUSCULAR | Status: DC | PRN

## 2022-08-09 MED ORDER — SODIUM CHLORIDE 0.9% FLUSH: 3.0000 mL | INTRAVENOUS | Status: DC | PRN

## 2022-08-09 MED ORDER — SPIRONOLACTONE 12.5 MG HALF TABLET
12.5000 mg | ORAL_TABLET | Freq: Every day | ORAL | Status: DC
  Administered 2022-08-09 – 2022-08-13 (×4): 12.5 mg via ORAL
  Filled 2022-08-09 (×4): qty 1

## 2022-08-09 MED ORDER — HEPARIN (PORCINE) IN NACL 1000-0.9 UT/500ML-% IV SOLN
INTRAVENOUS | Status: DC | PRN
  Administered 2022-08-09 (×2): 500 mL

## 2022-08-09 MED ORDER — LEVOTHYROXINE SODIUM 25 MCG PO TABS
25.0000 ug | ORAL_TABLET | Freq: Every day | ORAL | Status: DC
  Administered 2022-08-10 – 2022-08-11 (×2): 25 ug via ORAL
  Filled 2022-08-09 (×2): qty 1

## 2022-08-09 MED ORDER — ROSUVASTATIN CALCIUM 5 MG PO TABS
5.0000 mg | ORAL_TABLET | Freq: Every day | ORAL | Status: DC
  Administered 2022-08-09 – 2022-08-10 (×2): 5 mg via ORAL
  Filled 2022-08-09 (×2): qty 1

## 2022-08-09 MED ORDER — ALLOPURINOL 100 MG PO TABS
100.0000 mg | ORAL_TABLET | Freq: Every evening | ORAL | Status: DC
  Administered 2022-08-09 – 2022-08-12 (×4): 100 mg via ORAL
  Filled 2022-08-09 (×4): qty 1

## 2022-08-09 MED ORDER — TAMSULOSIN HCL 0.4 MG PO CAPS
0.4000 mg | ORAL_CAPSULE | Freq: Every day | ORAL | Status: DC
  Administered 2022-08-09 – 2022-08-12 (×4): 0.4 mg via ORAL
  Filled 2022-08-09 (×4): qty 1

## 2022-08-09 MED ORDER — DEXTROSE 50 % IV SOLN
INTRAVENOUS | Status: AC
  Administered 2022-08-09: 25 mL via INTRAVENOUS
  Filled 2022-08-09: qty 50

## 2022-08-09 MED ORDER — ASPIRIN 81 MG PO CHEW: 81.0000 mg | CHEWABLE_TABLET | ORAL | Status: DC

## 2022-08-09 MED ORDER — LABETALOL HCL 5 MG/ML IV SOLN: 10.0000 mg | INTRAVENOUS | Status: AC | PRN

## 2022-08-09 MED ORDER — SODIUM CHLORIDE 0.9% FLUSH
3.0000 mL | Freq: Two times a day (BID) | INTRAVENOUS | Status: DC
  Administered 2022-08-09 – 2022-08-12 (×3): 3 mL via INTRAVENOUS

## 2022-08-09 MED ORDER — SODIUM CHLORIDE 0.9 % IV SOLN: INTRAVENOUS | Status: DC

## 2022-08-09 MED ORDER — HYDRALAZINE HCL 20 MG/ML IJ SOLN: 10.0000 mg | INTRAMUSCULAR | Status: AC | PRN

## 2022-08-09 MED ORDER — VERAPAMIL HCL 2.5 MG/ML IV SOLN
INTRAVENOUS | Status: DC | PRN
  Administered 2022-08-09: 10 mL via INTRA_ARTERIAL

## 2022-08-09 MED ORDER — FUROSEMIDE 10 MG/ML IJ SOLN
80.0000 mg | Freq: Two times a day (BID) | INTRAMUSCULAR | Status: DC
  Administered 2022-08-09 – 2022-08-13 (×8): 80 mg via INTRAVENOUS
  Filled 2022-08-09 (×8): qty 8

## 2022-08-09 MED ORDER — EMPAGLIFLOZIN 10 MG PO TABS
10.0000 mg | ORAL_TABLET | Freq: Every day | ORAL | Status: DC
  Administered 2022-08-09 – 2022-08-10 (×2): 10 mg via ORAL
  Filled 2022-08-09 (×2): qty 1

## 2022-08-09 MED ORDER — ORAL CARE MOUTH RINSE: 15.0000 mL | OROMUCOSAL | Status: DC | PRN

## 2022-08-09 MED ORDER — SODIUM CHLORIDE 0.9% FLUSH
3.0000 mL | Freq: Two times a day (BID) | INTRAVENOUS | Status: DC
  Administered 2022-08-09: 3 mL via INTRAVENOUS

## 2022-08-09 MED ORDER — NITROGLYCERIN 0.4 MG SL SUBL: 0.4000 mg | SUBLINGUAL_TABLET | SUBLINGUAL | Status: DC | PRN

## 2022-08-09 MED ORDER — SODIUM CHLORIDE 0.9% FLUSH
3.0000 mL | Freq: Two times a day (BID) | INTRAVENOUS | Status: DC
  Administered 2022-08-10 – 2022-08-13 (×2): 3 mL via INTRAVENOUS

## 2022-08-09 MED ORDER — LIDOCAINE HCL (PF) 1 % IJ SOLN
INTRAMUSCULAR | Status: AC
  Filled 2022-08-09: qty 30

## 2022-08-09 MED ORDER — ISOSORBIDE MONONITRATE ER 30 MG PO TB24
30.0000 mg | ORAL_TABLET | Freq: Every day | ORAL | Status: DC
  Administered 2022-08-09 – 2022-08-10 (×2): 30 mg via ORAL
  Filled 2022-08-09 (×2): qty 1

## 2022-08-09 MED ORDER — ASPIRIN 81 MG PO CHEW
81.0000 mg | CHEWABLE_TABLET | Freq: Every morning | ORAL | Status: DC
  Administered 2022-08-10 – 2022-08-13 (×4): 81 mg via ORAL
  Filled 2022-08-09 (×4): qty 1

## 2022-08-09 MED ORDER — IOHEXOL 350 MG/ML SOLN
INTRAVENOUS | Status: DC | PRN
  Administered 2022-08-09: 35 mL

## 2022-08-09 SURGICAL SUPPLY — 13 items
BAND CMPR LRG ZPHR (HEMOSTASIS) ×1
BAND ZEPHYR COMPRESS 30 LONG (HEMOSTASIS) IMPLANT
CATH 5FR JL3.5 JR4 ANG PIG MP (CATHETERS) IMPLANT
CATH BALLN WEDGE 5F 110CM (CATHETERS) IMPLANT
GLIDESHEATH SLEND SS 6F .021 (SHEATH) IMPLANT
GUIDEWIRE .025 260CM (WIRE) IMPLANT
GUIDEWIRE INQWIRE 1.5J.035X260 (WIRE) IMPLANT
INQWIRE 1.5J .035X260CM (WIRE) ×1
PACK CARDIAC CATHETERIZATION (CUSTOM PROCEDURE TRAY) ×1 IMPLANT
SHEATH GLIDE SLENDER 4/5FR (SHEATH) IMPLANT
SHEATH PROBE COVER 6X72 (BAG) IMPLANT
TRANSDUCER W/STOPCOCK (MISCELLANEOUS) ×1 IMPLANT
WIRE HI TORQ VERSACORE-J 145CM (WIRE) IMPLANT

## 2022-08-09 NOTE — Progress Notes (Signed)
CBG 71, 25 ml D50 given.

## 2022-08-09 NOTE — Progress Notes (Signed)
Heart Failure Navigator Progress Note  Assessed for Heart & Vascular TOC clinic readiness.  Patient does not meet criteria due to Advanced Heart Failure Team patient of Dr. Bensimhon.   Navigator will sign off at this time.   Carmello Cabiness, BSN, RN Heart Failure Nurse Navigator Secure Chat Only   

## 2022-08-09 NOTE — Progress Notes (Signed)
ANTICOAGULATION CONSULT NOTE - Initial Consult  Pharmacy Consult for warfarin  Indication: atrial fibrillation  Allergies  Allergen Reactions   Clopidogrel Anaphylaxis and Other (See Comments)    altered mental status;  (electrolytes "out of wack" and confusion per family; THEY DO NOT REMEMBER ANY OTHER REACTION)- MSB 10/10/15   Ciprofloxacin Itching   Beta Adrenergic Blockers     Nightmares   Mirtazapine Diarrhea    Bad dreams    Spironolactone Other (See Comments)    gynecomastia    Patient Measurements: Height: 5\' 5"  (165.1 cm) Weight: 87.5 kg (193 lb) IBW/kg (Calculated) : 61.5  Vital Signs: Temp: 98.2 F (36.8 C) (05/24 1043) Temp Source: Temporal (05/24 1043) BP: 112/78 (05/24 1400) Pulse Rate: 85 (05/24 1400)  Labs: Recent Labs    08/07/22 1203 08/09/22 1038  LABPROT  --  22.3*  INR  --  1.9*  CREATININE 1.87*  --     Estimated Creatinine Clearance: 28.8 mL/min (A) (by C-G formula based on SCr of 1.87 mg/dL (H)).   Medical History: Past Medical History:  Diagnosis Date   Arthritis    "probably in his legs" (08/15/2016)   Blind right eye    BPH (benign prostatic hyperplasia)    Breast asymmetry    Left breast is larger, present for several years.   CAD (coronary artery disease)    a. Cath in the late 90's - reportedly ok;  b. 2014 s/p stenting x 2 @ [facility]; c 08/19/16 Cath/PCI with DES -> RCA, plan to treat LM 70% medically. Seen by surgery and felt to be too high risk for CABG; d. 08/2018 Cath: LM 70 (iFR 0.86), LAD 20ost, 40p, 50/7m, D1 70, LCX patent stent, OM1 30, RCA patent stent, 50m ISR, 40d, RPDA 30. PCWP 8. CO/CI 4.0/2.1; e. 03/2019 PCI to LAD (2.5x15 Resolute Onyx DES).   Chronic combined systolic (congestive) and diastolic (congestive) heart failure (HCC)    a. Previously reduced EF-->50% by echo in 2012;  b. 06/2015 Echo: EF 50-55%  c. 07/2016 Echo: EF 45-50%; d. 12/2017 Echo: EF 55%; e. 08/2018 Echo: EF 35-40%; f. 04/2019 Echo: EF 30-35%; g. s/p  cardiomems; h. 10/2020 Echo: EF 30-35%; i. 01/2022 Echo: EF 30-35%, glob HK, GrII DD, nl rV fxn, mild to mod MR.   Chronic kidney disease (CKD), stage IV (severe) (HCC)    CML (chronic myelocytic leukemia) (HCC)    GERD (gastroesophageal reflux disease)    GIB (gastrointestinal bleeding)    a. 12/2017 3 unit PRBC GIB in setting of coumadin-->Endo/colon multiple duodenal ulcers and a single bleeding ulcer in the proximal ascending colon status post hemostatic clipping x2.   Gout    Hyperlipemia    Hypertension    Hypothyroidism    Iron deficiency anemia    Ischemic cardiomyopathy    a. Previously reduced EF-->50% by echo in 2012;  b. 06/2015 Echo: EF 50-55%, Gr2 DD;  c. 07/2016 Echo: EF 45-50%; d. 12/2017 Echo: EF 55%, Gr1 DD, mild MR; e. 08/2018 Echo: EF 35-40%; f. 04/2019 Echo: EF 30-35%; g. 10/2020 Echo: EF 30-35%; h. Echo: EF 30-35%   Migraine    "in the 1960s" (08/15/2016)   Obstructive sleep apnea    Orthostatic hypotension    a. 20222 -->carvedilol and hydralazine d/c'd.   PAF (paroxysmal atrial fibrillation) (HCC)    a. ? Dx 2014-->s/p DCCV;  b. CHA2DS2VASc = 6--> Coumadin.   Prostate cancer (HCC)    RBBB    Type II diabetes mellitus (HCC)  Assessment: 89 yo male on chronic Coumadin for afib.  INR 1.9 today.  S/p cath lab, no further plans for PCI or intervention.  No overt bleeding or complications noted.  Goal of Therapy:  INR 2-3 Monitor platelets by anticoagulation protocol: Yes   Plan:  Coumadin 4.5 mg po x 1 tonight. Daily PT/INR.  Reece Leader, Colon Flattery, BCCP Clinical Pharmacist  08/09/2022 2:25 PM   [facility] pharmacy phone numbers are listed on [email or web address]

## 2022-08-09 NOTE — Telephone Encounter (Signed)
Pharmacy Tech and [facility] advised.   Thanks,   -Vernona Rieger

## 2022-08-09 NOTE — H&P (Signed)
Advanced Heart Failure Clinic Note   Date:  08/09/2022   ID:  [name], DOB 1933/09/15, MRN [id number]  Location: Home  Provider location: [place] [facility] Type of Visit: Established patient   PCP:  Smitty Cords, DO  Cardiologist:  Yvonne Kendall, MD HF Cardiologist: Dr. Gala Romney  Chief Complaint:  No chief complaint on file.  Heart failure follow up   HPI: Lucas Caldwell is a 89 y.o. 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 Okey Dupre for further evaluation of his HF.     Has h/o NICM previously followed by Dr. [name] at [facility]. He had good response to medical therapy in 2012 EF was reported to be 50%    Had PCI of his RCA CAD s/p RCA stent x2 08/2016 at [facility].    Echo 6/20 LVEF 35-40%   Cardiac cath 08/2018 70% LM stenosis with abnl FFR of 0.86, though drop in iFR was due to diffuse prox LAD disease and only small gradient involving ostial LM. Imdur was increased.    Admitted 04/13/19 with chest pain and A/C systolic heart failure. Repeat Cath 1/21 LM 60% LAD 70% with +FFR otherwise diffuse non-obstructive CAD -> PCI/DES LAD 1/21. RHC: RA 10 PA 53/30 (40) PCWP 28 CO/CI 4.2/2.1 PA sat 67%.   Echo 04/22/19 EF 30-35%   Referred to [facility] 04/2019 for further management of his systolic HF. Had cardiomems implanted in 7/22   Zio 4/21 SR w/ 1st AVB, avg HR 81 bpm, 1 brief 5 beat run of NSVT, Two runs SVT   Echo 8/22 EF 30-35% RV ok   Echo 1/23 EF 30-35%  Over the past month multiple visits to HF Clinic due to progressive HF symptoms and occasional chest tightness. Cardiomems elevated but did not respond to intensification of outpatient regimen. Brought in today for R/L cath   Cath with elevated filling pressures (PCWP 30) CI 2.1 and high grade distal LM disease  Admitted for HF tune-up    Review of Systems: [y] = yes, [ ]  = no      General: Weight gain [y]; Weight loss [ ] ; Anorexia [ ] ; Fatigue [y]; Fever [ ] ; Chills [ ] ; Weakness [place] ]   Cardiac: Chest pain/pressure [ ] ; Resting SOB [ y]; Exertional SOB [y]; Orthopnea [ ] ; Pedal Edema [y]; Palpitations [ ] ; Syncope [ ] ; Presyncope [ ] ; Paroxysmal nocturnal dyspnea[ ]    Pulmonary: Cough [ ] ; Wheezing[ ] ; Hemoptysis[ ] ; Sputum [ ] ; Snoring [ ]    GI: Vomiting[ ] ; Dysphagia[ ] ; Melena[ ] ; Hematochezia [ ] ; Heartburn[ ] ; Abdominal pain [ ] ; Constipation [ ] ; Diarrhea [ ] ; BRBPR [ ]    GU: Hematuria[ ] ; Dysuria [ ] ; Nocturia[ ]  Vascular: Pain in legs with walking [ ] ; Pain in feet with lying flat [ ] ; Non-healing sores [ ] ; Stroke [ ] ; TIA [ ] ; Slurred speech [ ] ;   Neuro: Headaches[ ] ; Vertigo[ ] ; Seizures[ ] ; Paresthesias[ ] ;Blurred vision [ ] ; Diplopia [ ] ; Vision changes [ ]    Ortho/Skin: Arthritis [y]; Joint pain [y]; Muscle pain [ ] ; Joint swelling [ ] ; Back Pain [ ] ; Rash [ ]    Psych: Depression[ ] ; Anxiety[ ]    Heme: Bleeding problems [ ] ; Clotting disorders [ ] ; Anemia [ ]    Endocrine: Diabetes [ ] ; Thyroid dysfunction[ ]   Past Medical History:  Diagnosis Date   Arthritis    "probably in his legs" (08/15/2016)   Blind right eye    BPH (benign prostatic hyperplasia)    Breast asymmetry    Left breast is larger, present for several years.   CAD (coronary artery disease)    a. Cath in the late 90's - reportedly ok;  b. 2014 s/p stenting x 2 @ [facility]; c 08/19/16 Cath/PCI with DES -> RCA, plan to treat LM 70% medically. Seen by surgery and felt to be too high risk for CABG; d. 08/2018 Cath: LM 70 (iFR 0.86), LAD 20ost, 40p, 50/46m, D1 70, LCX patent stent, OM1 30, RCA patent stent, 58m ISR, 40d, RPDA 30. PCWP 8. CO/CI 4.0/2.1; e. 03/2019 PCI to LAD (2.5x15 Resolute Onyx DES).   Chronic combined systolic (congestive) and diastolic (congestive) heart failure (HCC)    a. Previously reduced EF-->50% by echo in 2012;  b. 06/2015 Echo:  EF 50-55%  c. 07/2016 Echo: EF 45-50%; d. 12/2017 Echo: EF 55%; e. 08/2018 Echo: EF 35-40%; f. 04/2019 Echo: EF 30-35%; g. s/p cardiomems; h. 10/2020 Echo: EF 30-35%; i. 01/2022 Echo: EF 30-35%, glob HK, GrII DD, nl rV fxn, mild to mod MR.   Chronic kidney disease (CKD), stage IV (severe) (HCC)    CML (chronic myelocytic leukemia) (HCC)    GERD (gastroesophageal reflux disease)    GIB (gastrointestinal bleeding)    a. 12/2017 3 unit PRBC GIB in setting of coumadin-->Endo/colon multiple duodenal ulcers and a single bleeding ulcer in the proximal ascending colon status post hemostatic clipping x2.   Gout    Hyperlipemia    Hypertension    Hypothyroidism    Iron deficiency anemia    Ischemic cardiomyopathy    a. Previously reduced EF-->50% by echo in 2012;  b. 06/2015 Echo: EF 50-55%, Gr2 DD;  c. 07/2016 Echo: EF 45-50%; d. 12/2017 Echo: EF 55%, Gr1 DD, mild MR; e. 08/2018 Echo: EF 35-40%; f. 04/2019 Echo: EF 30-35%; g. 10/2020 Echo: EF 30-35%; h. Echo: EF 30-35%   Migraine    "in the 1960s" (08/15/2016)   Obstructive sleep apnea    Orthostatic hypotension    a. 20222 -->carvedilol and hydralazine d/c'd.   PAF (paroxysmal atrial fibrillation) (HCC)    a. ? Dx 2014-->s/p DCCV;  b. CHA2DS2VASc = 6--> Coumadin.   Prostate cancer (HCC)    RBBB    Type II diabetes mellitus [facility])    Past Surgical History:  Procedure Laterality Date   AMPUTATION TOE Right 02/23/2021   Procedure: AMPUTATION TOE;  Surgeon: Gwyneth Revels, DPM;  Location: [facility] ORS;  Service: Podiatry;  Laterality: Right;   CATARACT EXTRACTION W/ INTRAOCULAR LENS  IMPLANT, BILATERAL Bilateral    COLONOSCOPY N/A 01/13/2018   Procedure: COLONOSCOPY;  Surgeon: Toney Reil, MD;  Location: [facility] ENDOSCOPY;  Service: Gastroenterology;  Laterality: N/A;   COLONOSCOPY WITH PROPOFOL N/A 01/01/2018   Procedure: COLONOSCOPY WITH PROPOFOL;  Surgeon: Toney Reil, MD;  Location: [facility] ENDOSCOPY;  Service: Gastroenterology;  Laterality: N/A;    CORONARY ANGIOPLASTY WITH STENT PLACEMENT  09/2012   2 stents   CORONARY STENT INTERVENTION N/A 08/19/2016   Procedure: Coronary Stent Intervention;  Surgeon: Yvonne Kendall, MD;  Location: [facility] INVASIVE CV LAB;  Service: Cardiovascular;  Laterality: N/A;   CORONARY STENT INTERVENTION N/A 04/13/2019   Procedure: CORONARY STENT INTERVENTION;  Surgeon: Yvonne Kendall, MD;  Location: [facility] INVASIVE CV LAB;  Service: Cardiovascular;  Laterality: N/A;   ESOPHAGEAL MANOMETRY N/A  12/16/2017   Procedure: ESOPHAGEAL MANOMETRY (EM);  Surgeon: Toney Reil, MD;  Location: [facility] ENDOSCOPY;  Service: Gastroenterology;  Laterality: N/A;   ESOPHAGOGASTRODUODENOSCOPY N/A 12/20/2016   Procedure: ESOPHAGOGASTRODUODENOSCOPY (EGD);  Surgeon: Toney Reil, MD;  Location: [facility] ENDOSCOPY;  Service: Gastroenterology;  Laterality: N/A;   ESOPHAGOGASTRODUODENOSCOPY N/A 01/13/2018   Procedure: ESOPHAGOGASTRODUODENOSCOPY (EGD);  Surgeon: Toney Reil, MD;  Location: [facility] ENDOSCOPY;  Service: Gastroenterology;  Laterality: N/A;   ESOPHAGOGASTRODUODENOSCOPY (EGD) WITH PROPOFOL N/A 11/03/2017   Procedure: ESOPHAGOGASTRODUODENOSCOPY (EGD) WITH PROPOFOL;  Surgeon: Toney Reil, MD;  Location: [facility] ENDOSCOPY;  Service: Gastroenterology;  Laterality: N/A;   EYE SURGERY     HERNIA REPAIR     "navel"   LEFT HEART CATH AND CORONARY ANGIOGRAPHY N/A 08/14/2016   Procedure: Left Heart Cath and Coronary Angiography;  Surgeon: Yvonne Kendall, MD;  Location: [facility] INVASIVE CV LAB;  Service: Cardiovascular;  Laterality: N/A;   PRESSURE SENSOR/CARDIOMEMS N/A 09/23/2019   Procedure: PRESSURE SENSOR/CARDIOMEMS;  Surgeon: Dolores Patty, MD;  Location: [facility] INVASIVE CV LAB;  Service: Cardiovascular;  Laterality: N/A;   RIGHT HEART CATH N/A 09/23/2019   Procedure: RIGHT HEART CATH;  Surgeon: Dolores Patty, MD;  Location: [facility] INVASIVE CV LAB;  Service: Cardiovascular;  Laterality: N/A;   RIGHT/LEFT HEART CATH AND  CORONARY ANGIOGRAPHY N/A 09/01/2018   Procedure: RIGHT/LEFT HEART CATH AND CORONARY ANGIOGRAPHY;  Surgeon: Yvonne Kendall, MD;  Location: [facility] INVASIVE CV LAB;  Service: Cardiovascular;  Laterality: N/A;   RIGHT/LEFT HEART CATH AND CORONARY ANGIOGRAPHY N/A 04/13/2019   Procedure: RIGHT/LEFT HEART CATH AND CORONARY ANGIOGRAPHY;  Surgeon: Yvonne Kendall, MD;  Location: [facility] INVASIVE CV LAB;  Service: Cardiovascular;  Laterality: N/A;   TOE AMPUTATION Right    "big toe"   Current Facility-Administered Medications  Medication Dose Route Frequency Provider Last Rate Last Admin   0.9 %  sodium chloride infusion  250 mL Intravenous PRN Kynli Chou, Bevelyn Buckles, MD       0.9 %  sodium chloride infusion   Intravenous Continuous Lamira Borin, Bevelyn Buckles, MD       aspirin chewable tablet 81 mg  81 mg Oral Pre-Cath Jonae Renshaw, Bevelyn Buckles, MD       furosemide (LASIX) injection 80 mg  80 mg Intravenous BID Clegg, Amy D, NP       Heparin (Porcine) in NaCl 1000-0.9 UT/500ML-% SOLN    PRN Eriverto Byrnes, Bevelyn Buckles, MD   500 mL at 08/09/22 1131   heparin sodium (porcine) injection    PRN Sincere Berlanga, Bevelyn Buckles, MD   4,000 Units at 08/09/22 1220   iohexol (OMNIPAQUE) 350 MG/ML injection    PRN Jadea Shiffer, Bevelyn Buckles, MD   35 mL at 08/09/22 1238   lidocaine (PF) (XYLOCAINE) 1 % injection    PRN Shelbee Apgar, Bevelyn Buckles, MD   2 mL at 08/09/22 1207   milrinone (PRIMACOR) 20 MG/100 ML (0.2 mg/mL) infusion  0.125 mcg/kg/min Intravenous Continuous Jahleah Mariscal, Bevelyn Buckles, MD       Radial Cocktail/Verapamil only    PRN Breeze Berringer, Bevelyn Buckles, MD   10 mL at 08/09/22 1211   sodium chloride flush (NS) 0.9 % injection 3 mL  3 mL Intravenous Q12H Hadyn Azer, Bevelyn Buckles, MD       sodium chloride flush (NS) 0.9 % injection 3 mL  3 mL Intravenous PRN Raeanna Soberanes, Bevelyn Buckles, MD        Allergies:   Clopidogrel, Ciprofloxacin, Beta adrenergic blockers, Mirtazapine, and Spironolactone   Social History:  The patient  reports  that he quit smoking about 49 years ago. His  smoking use included cigarettes. He started smoking about 69 years ago. He has a 35.00 pack-year smoking history. He has quit using smokeless tobacco. He reports that he does not drink alcohol and does not use drugs.   Family History:  The patient's family history includes Brain cancer in his father; Diabetes in his sister; Heart attack in his sister.   ROS:  Please see the history of present illness.   All other systems are personally reviewed and negative.   BP 130/74 (BP Location: Right Arm, Patient Position: Sitting)   Pulse 88   Resp 16   Ht 5\' 4"  (1.626 m)   Wt 193 lb 4 oz (87.7 kg)   SpO2 99%   BMI 33.17 kg/m   Exam:   General:  Elderly. No resp difficulty HEENT: normal Neck: supple. JVP to jaw  Carotids 2+ bilat; no bruits. No lymphadenopathy or thryomegaly appreciated. Cor: PMI nondisplaced. Regular rate & rhythm. No rubs, gallops or murmurs. Lungs: clear Abdomen: soft, nontender, nondistended. No hepatosplenomegaly. No bruits or masses. Good bowel sounds. Extremities: no cyanosis, clubbing, rash, edema Neuro: alert & orientedx3, cranial nerves grossly intact. moves all 4 extremities w/o difficulty. Affect pleasant   02/05/2022: TSH 3.204 02/24/2022: ALT 12 08/02/2022: Hemoglobin 12.8; Platelets 219 08/07/2022: B Natriuretic Peptide 710.9; BUN 32; Creatinine, Ser 1.87; Potassium 3.6; Sodium 132   Wt Readings from Last 3 Encounters:  08/09/22 87.5 kg  08/07/22 88.9 kg  08/02/22 87.7 kg     ASSESSMENT AND PLAN:  1. Acute/chronic systolic HF: - likely combination of iCM/NICM - Echo (2/21): EF 30-35%  (was 35-40% in 6/20) - RHC (04/13/19): with elevated filling pressures but BNP normal  - Echo (8/22): EF 30-35%, grade I DD, RV low normal - Echo 11/23 EF 30-35% - Continue to struggle with NYHA IIIB-IV and decreasing doses of GDMT - Markedly volume overloaded on cath with low output  - Stop Toprol  - Stop losartan with AKI - Continue Jardiance 10 mg daily - Will  start milrinone and IV lasix - suspect end-stage.  - Not candidate for advanced therapies with age, CKD and progressive memory issues.  2. CAD - Cath 04/13/19 LAD 70% with +FFR otherwise diffuse non-obstructive CAD  - S/p PCI/DES to LAD 1/21 - Followed by Dr. Okey Dupre - Cath today 08/09/22: Severe CAD with high-grade severely calcified distal LM stenosis bifurcating into ostial LAD and LCX. [name] not CABG candidate. CAD reviewed with interventional team - PCI would be prohibitive risk and likely not do much to improve his HF which seems currently to be his major limitation  3. CKD Stage 3b - baseline SCr ~1.7-2.0 - follow closely  4. DM  - Continue Jardiance 10 mg daily. - No GU symptoms  5. PAF - Zio patch 4/21 showed no AF. Mostly NSR w/ brief SVT/ NSVT, PACs and PVCs - Remains in NSR on ECG today - Resume warfarin. Discussed dosing with PharmD personally. - No bleeding    Arvilla Meres, MD  1:45 PM   [facility] [address] Heart and Vascular [place] [address] [phone] (office) [phone] (fax)

## 2022-08-09 NOTE — Interval H&P Note (Signed)
History and Physical Interval Note:  08/09/2022 11:59 AM  Lucas Caldwell  has presented today for surgery, with the diagnosis of heart failure.  The various methods of treatment have been discussed with the patient and family. After consideration of risks, benefits and other options for treatment, the patient has consented to  Procedure(s): RIGHT/LEFT HEART CATH AND CORONARY ANGIOGRAPHY (N/A) as a surgical intervention.  The patient's history has been reviewed, patient examined, no change in status, stable for surgery.  I have reviewed the patient's chart and labs.  Questions were answered to the patient's satisfaction.     [name]

## 2022-08-10 ENCOUNTER — Other Ambulatory Visit: Payer: Self-pay

## 2022-08-10 DIAGNOSIS — I5023 Acute on chronic systolic (congestive) heart failure: Secondary | ICD-10-CM | POA: Diagnosis not present

## 2022-08-10 LAB — BASIC METABOLIC PANEL
Anion gap: 11 (ref 5–15)
BUN: 32 mg/dL — ABNORMAL HIGH (ref 8–23)
CO2: 23 mmol/L (ref 22–32)
Calcium: 8.7 mg/dL — ABNORMAL LOW (ref 8.9–10.3)
Chloride: 98 mmol/L (ref 98–111)
Creatinine, Ser: 2.13 mg/dL — ABNORMAL HIGH (ref 0.61–1.24)
GFR, Estimated: 30 mL/min — ABNORMAL LOW (ref >60)
Glucose, Bld: 114 mg/dL — ABNORMAL HIGH (ref 70–99)
Potassium: 4.4 mmol/L (ref 3.5–5.1)
Sodium: 132 mmol/L — ABNORMAL LOW (ref 135–145)

## 2022-08-10 LAB — PROTIME-INR
INR: 1.8 — ABNORMAL HIGH (ref 0.8–1.2)
Prothrombin Time: 21.2 seconds — ABNORMAL HIGH (ref 11.4–15.2)

## 2022-08-10 LAB — MAGNESIUM: Magnesium: 2.6 mg/dL — ABNORMAL HIGH (ref 1.7–2.4)

## 2022-08-10 MED ORDER — WARFARIN SODIUM 3 MG PO TABS
6.0000 mg | ORAL_TABLET | Freq: Once | ORAL | Status: AC
  Administered 2022-08-10: 6 mg via ORAL
  Filled 2022-08-10: qty 2

## 2022-08-10 NOTE — Progress Notes (Addendum)
Attempted PICC placement on right brachial.  Unable to get PICC beyond subclavian region into brachiocephalic or SVC.  Pt exhibited increase in confusion and dyspnea while under the sterile drape. Was picking at the drape and conversation not appropriate in regards to the drape and what he was seeing.  Respiratory rate increased to 25rpm, pt had removed pulse ox during procedure. Site cleaned with CHG, covered with secure port and biopatch with occlusive dressing in place.  No bleeding present.  EmaleighRN, at bedside, aware of events.  Dr Gala Romney notified.  Will reassess need Sunday am re attempt on left arm at that time.

## 2022-08-10 NOTE — Plan of Care (Signed)
  Problem: Cardiovascular: Goal: Ability to achieve and maintain adequate cardiovascular perfusion will improve Outcome: Progressing Goal: Vascular access site(s) Level 0-1 will be maintained Outcome: Progressing   Problem: Clinical Measurements: Goal: Will remain free from infection Outcome: Progressing Goal: Diagnostic test results will improve Outcome: Progressing   Problem: Activity: Goal: Risk for activity intolerance will decrease Outcome: Progressing   Problem: Nutrition: Goal: Adequate nutrition will be maintained Outcome: Progressing   Problem: Coping: Goal: Level of anxiety will decrease Outcome: Progressing   Problem: Elimination: Goal: Will not experience complications related to urinary retention Outcome: Progressing   Problem: Pain Managment: Goal: General experience of comfort will improve Outcome: Progressing   Problem: Safety: Goal: Ability to remain free from injury will improve Outcome: Progressing

## 2022-08-10 NOTE — Progress Notes (Signed)
Peripherally Inserted Central Catheter Placement  The IV Nurse has discussed with the patient and/or persons authorized to consent for the patient, the purpose of this procedure and the potential benefits and risks involved with this procedure.  The benefits include less needle sticks, lab draws from the catheter, and the patient may be discharged home with the catheter. Risks include, but not limited to, infection, bleeding, blood clot (thrombus formation), and puncture of an artery; nerve damage and irregular heartbeat and possibility to perform a PICC exchange if needed/ordered by physician.  Alternatives to this procedure were also discussed.  Bard Power PICC patient education guide, fact sheet on infection prevention and patient information card has been provided to patient /or left at bedside.  Consent obtained at bedside from wife. Friend, grandson and great grandson also at bedside.  PICC Placement Documentation        Lucas [place] 08/10/2022, 3:53 PM

## 2022-08-10 NOTE — Progress Notes (Signed)
ANTICOAGULATION CONSULT NOTE - Follow Up Consult  Pharmacy Consult for warfarin  Indication: atrial fibrillation  Allergies  Allergen Reactions   Clopidogrel Anaphylaxis and Other (See Comments)    altered mental status;  (electrolytes "out of wack" and confusion per family; THEY DO NOT REMEMBER ANY OTHER REACTION)- MSB 10/10/15   Ciprofloxacin Itching   Beta Adrenergic Blockers     Nightmares   Mirtazapine Diarrhea    Bad dreams    Spironolactone Other (See Comments)    gynecomastia    Patient Measurements: Height: 5\' 5"  (165.1 cm) Weight: 85.3 kg (188 lb 0.8 oz) IBW/kg (Calculated) : 61.5  Vital Signs: Temp: 98.6 F (37 C) (05/25 1137) Temp Source: Oral (05/25 1137) BP: 117/76 (05/25 1137) Pulse Rate: 100 (05/25 1137)  Labs: Recent Labs    08/09/22 1038 08/09/22 1214 08/09/22 1221 08/10/22 0024  HGB  --  12.6* 13.3  12.6*  --   HCT  --  37.0* 39.0  37.0*  --   LABPROT 22.3*  --   --  21.2*  INR 1.9*  --   --  1.8*  CREATININE  --   --   --  2.13*     Estimated Creatinine Clearance: 25 mL/min (A) (by C-G formula based on SCr of 2.13 mg/dL (H)).   Medical History: Past Medical History:  Diagnosis Date   Arthritis    "probably in his legs" (08/15/2016)   Blind right eye    BPH (benign prostatic hyperplasia)    Breast asymmetry    Left breast is larger, present for several years.   CAD (coronary artery disease)    a. Cath in the late 90's - reportedly ok;  b. 2014 s/p stenting x 2 @ [facility]; c 08/19/16 Cath/PCI with DES -> RCA, plan to treat LM 70% medically. Seen by surgery and felt to be too high risk for CABG; d. 08/2018 Cath: LM 70 (iFR 0.86), LAD 20ost, 40p, 50/11m, D1 70, LCX patent stent, OM1 30, RCA patent stent, 39m ISR, 40d, RPDA 30. PCWP 8. CO/CI 4.0/2.1; e. 03/2019 PCI to LAD (2.5x15 Resolute Onyx DES).   Chronic combined systolic (congestive) and diastolic (congestive) heart failure (HCC)    a. Previously reduced EF-->50% by echo in 2012;  b. 06/2015  Echo: EF 50-55%  c. 07/2016 Echo: EF 45-50%; d. 12/2017 Echo: EF 55%; e. 08/2018 Echo: EF 35-40%; f. 04/2019 Echo: EF 30-35%; g. s/p cardiomems; h. 10/2020 Echo: EF 30-35%; i. 01/2022 Echo: EF 30-35%, glob HK, GrII DD, nl rV fxn, mild to mod MR.   Chronic kidney disease (CKD), stage IV (severe) (HCC)    CML (chronic myelocytic leukemia) (HCC)    GERD (gastroesophageal reflux disease)    GIB (gastrointestinal bleeding)    a. 12/2017 3 unit PRBC GIB in setting of coumadin-->Endo/colon multiple duodenal ulcers and a single bleeding ulcer in the proximal ascending colon status post hemostatic clipping x2.   Gout    Hyperlipemia    Hypertension    Hypothyroidism    Iron deficiency anemia    Ischemic cardiomyopathy    a. Previously reduced EF-->50% by echo in 2012;  b. 06/2015 Echo: EF 50-55%, Gr2 DD;  c. 07/2016 Echo: EF 45-50%; d. 12/2017 Echo: EF 55%, Gr1 DD, mild MR; e. 08/2018 Echo: EF 35-40%; f. 04/2019 Echo: EF 30-35%; g. 10/2020 Echo: EF 30-35%; h. Echo: EF 30-35%   Migraine    "in the 1960s" (08/15/2016)   Obstructive sleep apnea    Orthostatic hypotension  a. 20222 -->carvedilol and hydralazine d/c'd.   PAF (paroxysmal atrial fibrillation) (HCC)    a. ? Dx 2014-->s/p DCCV;  b. CHA2DS2VASc = 6--> Coumadin.   Prostate cancer (HCC)    RBBB    Type II diabetes mellitus [facility])     Assessment: 89 yo male on chronic Coumadin for afib.  INR 1.8 today.> will boost   S/p cath lab, no further plans for PCI or intervention.  No overt bleeding or complications noted. PTA warfarin 4.5mg  daily except 3mg  MF  Goal of Therapy:  INR 2-3 Monitor platelets by anticoagulation protocol: Yes   Plan:  Coumadin 6 mg po x 1 tonight. Daily PT/INR.  Leota Sauers Pharm.D. CPP, BCPS Clinical Pharmacist [phone] 08/10/2022 3:38 PM   [facility] pharmacy phone numbers are listed on [email or web address]

## 2022-08-10 NOTE — Progress Notes (Signed)
Advanced Heart Failure Rounding Note   Subjective:    Cath yesterday with volume overload and low output. As well as high grade distal LM bifurcation CAD.  Now on milrinone. 0.125. Feels better. Modest urine output but weight down 5 pounds (?)  Scr up slightly.   Denies CP orthopnea or PND.   Objective:   Weight Range:  Vital Signs:   Temp:  [97.6 F (36.4 C)-98.6 F (37 C)] 98.6 F (37 C) (05/25 1137) Pulse Rate:  [83-132] 100 (05/25 1137) Resp:  [12-31] 17 (05/25 1137) BP: (106-131)/(61-95) 117/76 (05/25 1137) SpO2:  [76 %-99 %] 99 % (05/25 1137) Weight:  [85.3 kg-87.8 kg] 85.3 kg (05/25 0425) Last BM Date : 08/08/22  Weight change: Filed Weights   08/09/22 1043 08/09/22 1424 08/10/22 0425  Weight: 87.5 kg 87.8 kg 85.3 kg    Intake/Output:   Intake/Output Summary (Last 24 hours) at 08/10/2022 1146 Last data filed at 08/10/2022 1117 Gross per 24 hour  Intake 421.32 ml  Output 2250 ml  Net -1828.68 ml     Physical Exam: General:  Elderly male  No resp difficulty HEENT: normal Neck: supple. JVP hard to see but looks up . Carotids 2+ bilat; no bruits. No lymphadenopathy or thryomegaly appreciated. Cor: PMI nondisplaced. Regular rate & rhythm. No rubs, gallops or murmurs. Lungs: clear Abdomen: soft, nontender, nondistended. No hepatosplenomegaly. No bruits or masses. Good bowel sounds. Extremities: no cyanosis, clubbing, rash, edema Neuro: alert & orientedx3, cranial nerves grossly intact. moves all 4 extremities w/o difficulty. Affect pleasant  Telemetry: sinu 95-105 Personally reviewed  Labs: Basic Metabolic Panel: Recent Labs  Lab 08/07/22 1203 08/09/22 1214 08/09/22 1221 08/10/22 0024  NA 132* 136 136  137 132*  K 3.6 4.0 4.1  3.9 4.4  CL 97*  --   --  98  CO2 24  --   --  23  GLUCOSE 180*  --   --  114*  BUN 32*  --   --  32*  CREATININE 1.87*  --   --  2.13*  CALCIUM 8.8*  --   --  8.7*  MG  --   --   --  2.6*    Liver Function  Tests: No results for input(s): "AST", "ALT", "ALKPHOS", "BILITOT", "PROT", "ALBUMIN" in the last 168 hours. No results for input(s): "LIPASE", "AMYLASE" in the last 168 hours. No results for input(s): "AMMONIA" in the last 168 hours.  CBC: Recent Labs  Lab 08/09/22 1214 08/09/22 1221  HGB 12.6* 13.3  12.6*  HCT 37.0* 39.0  37.0*    Cardiac Enzymes: No results for input(s): "CKTOTAL", "CKMB", "CKMBINDEX", "TROPONINI" in the last 168 hours.  BNP: BNP (last 3 results) Recent Labs    07/23/22 1041 08/02/22 1315 08/07/22 1203  BNP 656.2* 583.6* 710.9*    ProBNP (last 3 results) No results for input(s): "PROBNP" in the last 8760 hours.    Other results:  Imaging: CARDIAC CATHETERIZATION  Result Date: 08/09/2022   Mid LAD-2 lesion is 10% stenosed.   Mid RCA lesion is 40% stenosed.   Dist RCA lesion is 40% stenosed.   1st Diag lesion is 70% stenosed.   1st Mrg lesion is 30% stenosed.   RPDA lesion is 30% stenosed.   Ost Cx to Prox Cx lesion is 90% stenosed.   Mid LM to Prox LAD lesion is 80% stenosed.   Prox LAD lesion is 40% stenosed.   Mid LAD-1 lesion is 50% stenosed.  Prox RCA lesion is 30% stenosed.   Non-stenotic Mid Cx to Dist Cx lesion was previously treated.   Non-stenotic Prox RCA to Mid RCA lesion was previously treated. Findings: Ao = 101/58 (76) LV = 107/28 RA = 14 RV = 59/16 PA = 59/29 (39) PCW = 34 Fick cardiac output/index = 4.1/2.1 PVR = 1.2 WU Ao sat = 97% PA sat = 60%, 61% Assessment: 1. Severe CAD with high-grade severely calcified distal LM stenosis bifurcating into ostial LAD and LCX 2. EF 25% by echo 3. Elevated filling pressures with low cardiac output Plan/Discussion: I suspect he has end-stage heart disease with low output HF and elevated filling pressures not responding to aggressive medical therapy. [name] not CABG candidate. CAD reviewed with interventional team - PCI would be prohibitive risk. Will admit for HF tune-up with milrinone and IV diuresis.  Arvilla Meres, MD 12:59 PM    Medications:     Scheduled Medications:  allopurinol  100 mg Oral QPM   aspirin  81 mg Oral q AM   empagliflozin  10 mg Oral Daily   furosemide  80 mg Intravenous BID   isosorbide mononitrate  30 mg Oral Daily   levothyroxine  25 mcg Oral Q0600   ranolazine  500 mg Oral BID   rosuvastatin  5 mg Oral QHS   sodium chloride flush  3 mL Intravenous Q12H   sodium chloride flush  3 mL Intravenous Q12H   sodium chloride flush  3 mL Intravenous Q12H   spironolactone  12.5 mg Oral Daily   tamsulosin  0.4 mg Oral QPC supper   Warfarin - Pharmacist Dosing Inpatient   Does not apply q1600    Infusions:  sodium chloride     sodium chloride     sodium chloride     sodium chloride     milrinone 0.125 mcg/kg/min (08/10/22 0900)    PRN Medications: sodium chloride, sodium chloride, sodium chloride, acetaminophen, nitroGLYCERIN, ondansetron (ZOFRAN) IV, mouth rinse, sodium chloride flush, sodium chloride flush, sodium chloride flush   Assessment/Plan:   1. Acute/chronic systolic HF: - likely combination of iCM/NICM - Echo (2/21): EF 30-35%  (was 35-40% in 6/20) - RHC (04/13/19): with elevated filling pressures but BNP normal  - Echo (8/22): EF 30-35%, grade I DD, RV low normal - Echo 11/23 EF 30-35% - Continue to struggle with NYHA IIIB-IV and decreasing doses of GDMT - Markedly volume overloaded on cath with low output  - Stopped Toprol  - Stopped losartan with AKI - Continue Jardiance 10 mg daily - Continue milrinone and IV lasix. - Place PICC to follow more closely.  - suspect end-stage.  - Not candidate for advanced therapies with age, CKD and progressive memory issues.   2. CAD - Cath 04/13/19 LAD 70% with +FFR otherwise diffuse non-obstructive CAD  - S/p PCI/DES to LAD 1/21 - Followed by Dr. Okey Dupre - No current s/s angina - Cath 08/09/22: Severe CAD with high-grade severely calcified distal LM stenosis bifurcating into ostial LAD and LCX. [name]  not CABG candidate. CAD reviewed with interventional team - PCI would be prohibitive risk and likely not do much to improve his HF which seems currently to be his major limitation   3. CKD Stage 3b - baseline SCr ~1.7-2.0 -> 2.1 today - follow closely   4. DM  - Continue Jardiance 10 mg daily. - No GU symptoms   5. PAF - Zio patch 4/21 showed no AF. Mostly NSR w/ brief SVT/ NSVT,  PACs and PVCs - Remains in NSR - Resume warfarin post cath. INR 1.8 today. Discussed dosing with PharmD personally. - No bleeding    Length of Stay: 1   Arvilla Meres MD 08/10/2022, 11:46 AM  Advanced Heart Failure Team Pager [phone] (M-F; 7a - 4p)  Please contact [facility] for night-coverage after hours (4p -7a ) and weekends on [email or web address]

## 2022-08-11 DIAGNOSIS — I5023 Acute on chronic systolic (congestive) heart failure: Secondary | ICD-10-CM | POA: Diagnosis not present

## 2022-08-11 DIAGNOSIS — Z515 Encounter for palliative care: Secondary | ICD-10-CM

## 2022-08-11 DIAGNOSIS — Z7189 Other specified counseling: Secondary | ICD-10-CM

## 2022-08-11 DIAGNOSIS — R0602 Shortness of breath: Secondary | ICD-10-CM

## 2022-08-11 LAB — CBC
HCT: 39.7 % (ref 39.0–52.0)
Hemoglobin: 13.2 g/dL (ref 13.0–17.0)
MCH: 28.2 pg (ref 26.0–34.0)
MCHC: 33.2 g/dL (ref 30.0–36.0)
MCV: 84.8 fL (ref 80.0–100.0)
Platelets: 197 10*3/uL (ref 150–400)
RBC: 4.68 MIL/uL (ref 4.22–5.81)
RDW: 14.7 % (ref 11.5–15.5)
WBC: 9.6 10*3/uL (ref 4.0–10.5)
nRBC: 0 % (ref 0.0–0.2)

## 2022-08-11 LAB — HEPATIC FUNCTION PANEL
ALT: 23 U/L (ref 0–44)
AST: 29 U/L (ref 15–41)
Albumin: 3.7 g/dL (ref 3.5–5.0)
Alkaline Phosphatase: 72 U/L (ref 38–126)
Bilirubin, Direct: 0.3 mg/dL — ABNORMAL HIGH (ref 0.0–0.2)
Indirect Bilirubin: 0.8 mg/dL (ref 0.3–0.9)
Total Bilirubin: 1.1 mg/dL (ref 0.3–1.2)
Total Protein: 8.2 g/dL — ABNORMAL HIGH (ref 6.5–8.1)

## 2022-08-11 LAB — GLUCOSE, CAPILLARY
Glucose-Capillary: 158 mg/dL — ABNORMAL HIGH (ref 70–99)
Glucose-Capillary: 116 mg/dL — ABNORMAL HIGH (ref 70–99)
Glucose-Capillary: 156 mg/dL — ABNORMAL HIGH (ref 70–99)
Glucose-Capillary: 143 mg/dL — ABNORMAL HIGH (ref 70–99)

## 2022-08-11 LAB — URINALYSIS, ROUTINE W REFLEX MICROSCOPIC
Bacteria, UA: NONE SEEN
Bilirubin Urine: NEGATIVE
Glucose, UA: >=500 mg/dL — AB
Hgb urine dipstick: NEGATIVE
Ketones, ur: NEGATIVE mg/dL
Leukocytes,Ua: NEGATIVE
Nitrite: NEGATIVE
Protein, ur: NEGATIVE mg/dL
Specific Gravity, Urine: 1.011 (ref 1.005–1.030)
pH: 6 (ref 5.0–8.0)

## 2022-08-11 LAB — AMMONIA: Ammonia: 36 umol/L — ABNORMAL HIGH (ref 9–35)

## 2022-08-11 MED ORDER — LORAZEPAM 2 MG/ML IJ SOLN
2.0000 mg | Freq: Once | INTRAMUSCULAR | Status: AC
  Administered 2022-08-11: 2 mg via INTRAVENOUS
  Filled 2022-08-11: qty 1

## 2022-08-11 MED ORDER — QUETIAPINE FUMARATE 50 MG PO TABS
25.0000 mg | ORAL_TABLET | Freq: Every day | ORAL | Status: DC
  Administered 2022-08-12: 25 mg via ORAL
  Filled 2022-08-11: qty 1

## 2022-08-11 MED ORDER — INSULIN ASPART 100 UNIT/ML IJ SOLN
0.0000 [IU] | Freq: Three times a day (TID) | INTRAMUSCULAR | Status: DC
  Administered 2022-08-12 (×2): 1 [IU] via SUBCUTANEOUS

## 2022-08-11 MED ORDER — HALOPERIDOL LACTATE 5 MG/ML IJ SOLN
2.0000 mg | Freq: Once | INTRAMUSCULAR | Status: AC
  Administered 2022-08-11: 2 mg via INTRAVENOUS
  Filled 2022-08-11: qty 1

## 2022-08-11 NOTE — Consult Note (Signed)
Palliative Care Consult Note                                  Date: 08/11/2022   Patient Name: Lucas Caldwell  DOB: 1933-11-01  MRN: [id number]  Age / Sex: 89 y.o., male  PCP: Smitty Cords, DO Referring Physician: Zannie Cove, MD  Reason for Consultation: Establishing goals of care  HPI/Patient Profile: 89 y.o. male  with past medical history of CAD, chronic systolic CHF, mixed cardiomyopathy, paroxysmal A-fib on Coumadin, CKD 3b, chronic myelocytic leukemia, hypertension and ongoing cognitive decline, early dementia was admitted 08/09/2022 per CHF team, for persistent volume overload despite escalating doses of diuretics, admitted, underwent right and left heart cath noted to have elevated filling pressures, low cardiac index, high-grade distal left main disease, not candidate for CABG.  On 08/10/22 the patient developed more confusion, some agitation, worsened today, with hallucination, more agitated pulling monitors, IV etc.  Past Medical History:  Diagnosis Date   Arthritis    "probably in his legs" (08/15/2016)   Blind right eye    BPH (benign prostatic hyperplasia)    Breast asymmetry    Left breast is larger, present for several years.   CAD (coronary artery disease)    a. Cath in the late 90's - reportedly ok;  b. 2014 s/p stenting x 2 @ [facility]; c 08/19/16 Cath/PCI with DES -> RCA, plan to treat LM 70% medically. Seen by surgery and felt to be too high risk for CABG; d. 08/2018 Cath: LM 70 (iFR 0.86), LAD 20ost, 40p, 50/50m, D1 70, LCX patent stent, OM1 30, RCA patent stent, 72m ISR, 40d, RPDA 30. PCWP 8. CO/CI 4.0/2.1; e. 03/2019 PCI to LAD (2.5x15 Resolute Onyx DES).   Chronic combined systolic (congestive) and diastolic (congestive) heart failure (HCC)    a. Previously reduced EF-->50% by echo in 2012;  b. 06/2015 Echo: EF 50-55%  c. 07/2016 Echo: EF 45-50%; d. 12/2017 Echo: EF 55%; e. 08/2018 Echo: EF 35-40%; f. 04/2019 Echo:  EF 30-35%; g. s/p cardiomems; h. 10/2020 Echo: EF 30-35%; i. 01/2022 Echo: EF 30-35%, glob HK, GrII DD, nl rV fxn, mild to mod MR.   Chronic kidney disease (CKD), stage IV (severe) (HCC)    CML (chronic myelocytic leukemia) (HCC)    GERD (gastroesophageal reflux disease)    GIB (gastrointestinal bleeding)    a. 12/2017 3 unit PRBC GIB in setting of coumadin-->Endo/colon multiple duodenal ulcers and a single bleeding ulcer in the proximal ascending colon status post hemostatic clipping x2.   Gout    Hyperlipemia    Hypertension    Hypothyroidism    Iron deficiency anemia    Ischemic cardiomyopathy    a. Previously reduced EF-->50% by echo in 2012;  b. 06/2015 Echo: EF 50-55%, Gr2 DD;  c. 07/2016 Echo: EF 45-50%; d. 12/2017 Echo: EF 55%, Gr1 DD, mild MR; e. 08/2018 Echo: EF 35-40%; f. 04/2019 Echo: EF 30-35%; g. 10/2020 Echo: EF 30-35%; h. Echo: EF 30-35%   Migraine    "in the 1960s" (08/15/2016)   Obstructive sleep apnea    Orthostatic hypotension    a. 20222 -->carvedilol and hydralazine d/c'd.   PAF (paroxysmal atrial fibrillation) (HCC)    a. ? Dx 2014-->s/p DCCV;  b. CHA2DS2VASc = 6--> Coumadin.   Prostate cancer (HCC)    RBBB    Type II diabetes mellitus (HCC)     Subjective:  I have reviewed medical records including EPIC notes, labs and imaging, spoke with HF team, assessed the patient and then met with the patient's wife and daughter (son was on phone for part of conversation) to discuss diagnosis prognosis, GOC, EOL wishes, disposition and options.  I introduced Palliative Medicine as specialized medical care for people living with serious illness. It focuses on providing relief from symptoms and stress of a serious illness. The goal is to improve quality of life for both the patient and the family.  Created space and opportunity for patient  and family to explore thoughts and feelings regarding current medical situation. Values and goals of care important to patient and family were  attempted to be elicited.    Patient/Family Understanding of Illness: The patient's family has a good understanding of the patients acute and chronic conditions. They understand the patient is not a candidate  Social History: The patient has been married 64 years. They have a daughter and a son who live in the area. They also have grandchildren. 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 Since retirement he has enjoyed driving his personal truck around town.  Functional and Nutritional State: The patient's family describes him as very independent. Prior to admission he was walking with a cane and still driving. He has had a good appetite. He had an increased SOB over the last couple months.    Today's Discussion: We discussed the patient's declining health status and that the patient was not a candidate for advanced therapies. I told them we could keep the patient comfortable and manage his symptoms.  We discussed that if the patient were to transition to comfort care/hospice the patient would no longer receive aggressive medical interventions such as continuous vital signs, lab work, radiology testing, or medications not focused on comfort. All care would focus on how the patient is looking and feeling. This would include management of any symptoms that may cause discomfort, pain, shortness of breath, cough, nausea, agitation, anxiety, and/or secretions etc. Symptoms would be managed with medications and other non-pharmacological interventions such as spiritual support if requested, repositioning, music therapy, or therapeutic listening. We discussed both home and inpatient hospice. The patient's daughter told her mother that it might be too much to take care of him at home, I agreed. Family verbalized understanding and appreciation. They would like to discuss with the rest of the family.  Discussed the importance of continued conversation with family and the medical  providers regarding overall plan of care and treatment options, ensuring decisions are within the context of the patient's values and GOCs.  Questions and concerns were addressed. Hard Choices booklet left for review. The family was encouraged to call with questions or concerns. PMT will continue to support holistically and follow up tomorrow.  Review of Systems  Unable to perform ROS   Objective:   Primary Diagnoses: Present on Admission:  Acute on chronic systolic (congestive) heart failure [facility])   Physical Exam Neurological:     Mental Status: [name] disoriented.     Vital Signs:  BP 129/83 (BP Location: Left Arm)   Pulse 99   Temp 98.3 F (36.8 C) (Axillary)   Resp 16   Ht 5\' 5"  (1.651 m)   Wt 83.2 kg   SpO2 97%   BMI 30.52 kg/m   Palliative Assessment/Data: 30%    Advanced Care Planning:   Existing Vynca/ACP Documentation: None  Primary Decision Maker: NEXT OF KIN. The patient's wife  Jabril Yell is the Oncologist.  Code Status/Advance Care Planning: DNR  A discussion was had today regarding the patient's poor prognosis. The difference between a aggressive medical intervention path and a palliative comfort care path for this patient at this time was had.   Decisions/Changes to ACP: None.  Assessment & Plan:   SUMMARY OF RECOMMENDATIONS   DNR Encouraged family to discuss comfort/hospice option- recommended inpatient hospice PMT will continue to support holistically   Prognosis:  < 2 weeks  Discharge Planning:  To Be Determined   Discussed with: bedside RN, Dr. Jomarie Longs, and Dr. Gala Romney    Thank you for allowing [place] to participate in the care of [name]   Time Total: 75 minutes  Signed by: Sarina Ser, NP Palliative Medicine Team  Team Phone # [phone] (Nights/Weekends)  08/11/2022, 12:36 PM

## 2022-08-11 NOTE — Progress Notes (Signed)
Patient confused and hallucinating, has not slept all night. Patient pulling monitors and trying to pull IV out. Bed alarm and chair alarms in use. Patient oriented to self only at this time. Patient states that there are chickens, roosters and dogs walking around his room. Patient states that he "feels like things in the room are moving" states "the wall is moving towards me", states "I feels drunk" RN sitting with patient in the room.

## 2022-08-11 NOTE — Progress Notes (Signed)
Advanced Heart Failure Rounding Note   Subjective:    Cath 5/24 with volume overload and low output. As well as high grade distal LM bifurcation CAD.  Now on milrinone. 0.125 and IV lasix. Diuresed well. Weight down 5 pounds.  This am he is delirious and threatening to assault nurses. Was given haldol and ativan. Now more calm.   Unable to answer questions.   Family reports progressive physical and cognitive decline over past 6 months.   Objective:   Weight Range:  Vital Signs:   Temp:  [97.7 F (36.5 C)-98.5 F (36.9 C)] 98.3 F (36.8 C) (05/26 1200) Pulse Rate:  [93-100] 99 (05/26 1200) Resp:  [16-20] 16 (05/26 1200) BP: (117-135)/(60-89) 129/83 (05/26 1200) SpO2:  [95 %-100 %] 97 % (05/26 1200) Weight:  [83.2 kg] 83.2 kg (05/26 0500) Last BM Date : 08/08/22  Weight change: Filed Weights   08/09/22 1424 08/10/22 0425 08/11/22 0500  Weight: 87.8 kg 85.3 kg 83.2 kg    Intake/Output:   Intake/Output Summary (Last 24 hours) at 08/11/2022 1233 Last data filed at 08/11/2022 1224 Gross per 24 hour  Intake 414.96 ml  Output 3075 ml  Net -2660.04 ml      Physical Exam: General:  Sitting in chair drowsy  No resp difficulty HEENT: normal Neck: supple. JVP hard to see Carotids 2+ bilat; no bruits. No lymphadenopathy or thryomegaly appreciated. Cor: Regular rate & rhythm.2/6 MR Lungs: clear Abdomen: soft, nontender, nondistended. No hepatosplenomegaly. No bruits or masses. Good bowel sounds. Extremities: no cyanosis, clubbing, rash, edema Neuro: drowsy confused  Telemetry: sinus 90-110 Personally reviewed   Labs: Basic Metabolic Panel: Recent Labs  Lab 08/07/22 1203 08/09/22 1214 08/09/22 1221 08/10/22 0024  NA 132* 136 136  137 132*  K 3.6 4.0 4.1  3.9 4.4  CL 97*  --   --  98  CO2 24  --   --  23  GLUCOSE 180*  --   --  114*  BUN 32*  --   --  32*  CREATININE 1.87*  --   --  2.13*  CALCIUM 8.8*  --   --  8.7*  MG  --   --   --  2.6*      Liver Function Tests: No results for input(s): "AST", "ALT", "ALKPHOS", "BILITOT", "PROT", "ALBUMIN" in the last 168 hours. No results for input(s): "LIPASE", "AMYLASE" in the last 168 hours. No results for input(s): "AMMONIA" in the last 168 hours.  CBC: Recent Labs  Lab 08/09/22 1214 08/09/22 1221 08/11/22 1127  WBC  --   --  9.6  HGB 12.6* 13.3  12.6* 13.2  HCT 37.0* 39.0  37.0* 39.7  MCV  --   --  84.8  PLT  --   --  197     Cardiac Enzymes: No results for input(s): "CKTOTAL", "CKMB", "CKMBINDEX", "TROPONINI" in the last 168 hours.  BNP: BNP (last 3 results) Recent Labs    07/23/22 1041 08/02/22 1315 08/07/22 1203  BNP 656.2* 583.6* 710.9*     ProBNP (last 3 results) No results for input(s): "PROBNP" in the last 8760 hours.    Other results:  Imaging: [place] EKG SITE RITE  Result Date: 08/10/2022 If Site Rite image not attached, placement could not be confirmed due to current cardiac rhythm.    Medications:     Scheduled Medications:  allopurinol  100 mg Oral QPM   aspirin  81 mg Oral q AM   empagliflozin  10 mg Oral Daily   furosemide  80 mg Intravenous BID   insulin aspart  0-6 Units Subcutaneous TID WC   isosorbide mononitrate  30 mg Oral Daily   levothyroxine  25 mcg Oral Q0600   ranolazine  500 mg Oral BID   rosuvastatin  5 mg Oral QHS   sodium chloride flush  3 mL Intravenous Q12H   sodium chloride flush  3 mL Intravenous Q12H   sodium chloride flush  3 mL Intravenous Q12H   spironolactone  12.5 mg Oral Daily   tamsulosin  0.4 mg Oral QPC supper    Infusions:  sodium chloride     sodium chloride     sodium chloride     sodium chloride      PRN Medications: sodium chloride, sodium chloride, sodium chloride, acetaminophen, nitroGLYCERIN, ondansetron (ZOFRAN) IV, mouth rinse, sodium chloride flush, sodium chloride flush, sodium chloride flush   Assessment/Plan:   1. Acute/chronic systolic HF: - likely combination of  iCM/NICM - Echo (2/21): EF 30-35%  (was 35-40% in 6/20) - RHC (04/13/19): with elevated filling pressures but BNP normal  - Echo (8/22): EF 30-35%, grade I DD, RV low normal - Echo 11/23 EF 30-35% - Continue to struggle with NYHA IIIB-IV and decreasing doses of GDMT - Markedly volume overloaded on cath with low output  - Stopped Toprol  - Stopped losartan with AKI - Continue Jardiance 10 mg daily - [name] end-stage.Not candidate for advanced therapies with age, CKD and progressive memory issues. - Have engaged Palliative Care and [facility] with impending plan to switch to comfort care  - Stop milrinone. Continue diuretics for now    2. CAD - Cath 04/13/19 LAD 70% with +FFR otherwise diffuse non-obstructive CAD  - S/p PCI/DES to LAD 1/21 - Followed by Dr. Okey Dupre - No current s/s angina - Cath 08/09/22: Severe CAD with high-grade severely calcified distal LM stenosis bifurcating into ostial LAD and LCX. [name] not CABG candidate. CAD reviewed with interventional team - PCI would be prohibitive risk and likely not do much to improve his HF which seems currently to be his major limitation - Pending switch to comfort care. Will stop all nonessential meds   3. CKD Stage 3b - baseline SCr ~1.7-2.0 -> 2.1 -> pending - follow closely   4. DM  - Continue Jardiance 10 mg daily. - No GU symptoms   5. PAF - Zio patch 4/21 showed no AF. Mostly NSR w/ brief SVT/ NSVT, PACs and PVCs - Remains in NSR - Stop warfarin as risk for bleeding is high   6. Dementia/acute delirium - progressive - received haldol and ativan today - [facility] engaged in care  7. DNR/DNI - D/w Palliative Care at bedside - plan transition to comfort care  Appreciate [facility]'s care. They will assume primary. AHF team will continue to follow.     Length of Stay: 2   Arvilla Meres MD 08/11/2022, 12:33 PM  Advanced Heart Failure Team Pager [phone] (M-F; 7a - 4p)  Please contact [facility] for night-coverage after hours (4p -7a  ) and weekends on [email or web address]

## 2022-08-11 NOTE — Consult Note (Signed)
Medical Consultation   JOHNEDWARD BLADEN  ZOX:[id number]  DOB: 1934/02/05  DOA: 08/09/2022  PCP: Smitty Cords, DO   Outpatient Specialists: Cards Dr.End and Bensimhon   Requesting physician: CHF team Dr.Bensimhon  Reason for consultation: agitation and delirium  History of Present Illness: 88/M with history of CAD, chronic systolic CHF, mixed cardiomyopathy, paroxysmal A-fib on Coumadin, CKD 3b, chronic myelocytic leukemia, hypertension and ongoing cognitive decline, early dementia was admitted 5/24 per CHF team, for persistent volume overload despite escalating doses of diuretics, admitted, underwent right and left heart cath noted to have elevated filling pressures, low cardiac index, high-grade distal left main disease, not candidate for CABG -5/25, patient developed more confusion, some agitation, worsened today, with hallucination, more agitated pulling monitors, IV etc. no fevers or chills, vitals relatively stable -TRH consulted for medical management -Received 2 mg of Haldol and Ativan earlier this morning, now fast asleep   Review of Systems: Unable to obtain due to mental status   Past Medical History: Past Medical History:  Diagnosis Date   Arthritis    "probably in his legs" (08/15/2016)   Blind right eye    BPH (benign prostatic hyperplasia)    Breast asymmetry    Left breast is larger, present for several years.   CAD (coronary artery disease)    a. Cath in the late 90's - reportedly ok;  b. 2014 s/p stenting x 2 @ [facility]; c 08/19/16 Cath/PCI with DES -> RCA, plan to treat LM 70% medically. Seen by surgery and felt to be too high risk for CABG; d. 08/2018 Cath: LM 70 (iFR 0.86), LAD 20ost, 40p, 50/90m, D1 70, LCX patent stent, OM1 30, RCA patent stent, 22m ISR, 40d, RPDA 30. PCWP 8. CO/CI 4.0/2.1; e. 03/2019 PCI to LAD (2.5x15 Resolute Onyx DES).   Chronic combined systolic (congestive) and diastolic (congestive) heart failure (HCC)    a.  Previously reduced EF-->50% by echo in 2012;  b. 06/2015 Echo: EF 50-55%  c. 07/2016 Echo: EF 45-50%; d. 12/2017 Echo: EF 55%; e. 08/2018 Echo: EF 35-40%; f. 04/2019 Echo: EF 30-35%; g. s/p cardiomems; h. 10/2020 Echo: EF 30-35%; i. 01/2022 Echo: EF 30-35%, glob HK, GrII DD, nl rV fxn, mild to mod MR.   Chronic kidney disease (CKD), stage IV (severe) (HCC)    CML (chronic myelocytic leukemia) (HCC)    GERD (gastroesophageal reflux disease)    GIB (gastrointestinal bleeding)    a. 12/2017 3 unit PRBC GIB in setting of coumadin-->Endo/colon multiple duodenal ulcers and a single bleeding ulcer in the proximal ascending colon status post hemostatic clipping x2.   Gout    Hyperlipemia    Hypertension    Hypothyroidism    Iron deficiency anemia    Ischemic cardiomyopathy    a. Previously reduced EF-->50% by echo in 2012;  b. 06/2015 Echo: EF 50-55%, Gr2 DD;  c. 07/2016 Echo: EF 45-50%; d. 12/2017 Echo: EF 55%, Gr1 DD, mild MR; e. 08/2018 Echo: EF 35-40%; f. 04/2019 Echo: EF 30-35%; g. 10/2020 Echo: EF 30-35%; h. Echo: EF 30-35%   Migraine    "in the 1960s" (08/15/2016)   Obstructive sleep apnea    Orthostatic hypotension    a. 20222 -->carvedilol and hydralazine d/c'd.   PAF (paroxysmal atrial fibrillation) (HCC)    a. ? Dx 2014-->s/p DCCV;  b. CHA2DS2VASc = 6--> Coumadin.   Prostate cancer (HCC)    RBBB  Type II diabetes mellitus [facility])     Past Surgical History: Past Surgical History:  Procedure Laterality Date   AMPUTATION TOE Right 02/23/2021   Procedure: AMPUTATION TOE;  Surgeon: Gwyneth Revels, DPM;  Location: [facility] ORS;  Service: Podiatry;  Laterality: Right;   CATARACT EXTRACTION W/ INTRAOCULAR LENS  IMPLANT, BILATERAL Bilateral    COLONOSCOPY N/A 01/13/2018   Procedure: COLONOSCOPY;  Surgeon: Toney Reil, MD;  Location: [facility] ENDOSCOPY;  Service: Gastroenterology;  Laterality: N/A;   COLONOSCOPY WITH PROPOFOL N/A 01/01/2018   Procedure: COLONOSCOPY WITH PROPOFOL;  Surgeon: Toney Reil, MD;  Location: [facility] ENDOSCOPY;  Service: Gastroenterology;  Laterality: N/A;   CORONARY ANGIOPLASTY WITH STENT PLACEMENT  09/2012   2 stents   CORONARY STENT INTERVENTION N/A 08/19/2016   Procedure: Coronary Stent Intervention;  Surgeon: Yvonne Kendall, MD;  Location: [facility] INVASIVE CV LAB;  Service: Cardiovascular;  Laterality: N/A;   CORONARY STENT INTERVENTION N/A 04/13/2019   Procedure: CORONARY STENT INTERVENTION;  Surgeon: Yvonne Kendall, MD;  Location: [facility] INVASIVE CV LAB;  Service: Cardiovascular;  Laterality: N/A;   ESOPHAGEAL MANOMETRY N/A 12/16/2017   Procedure: ESOPHAGEAL MANOMETRY (EM);  Surgeon: Toney Reil, MD;  Location: [facility] ENDOSCOPY;  Service: Gastroenterology;  Laterality: N/A;   ESOPHAGOGASTRODUODENOSCOPY N/A 12/20/2016   Procedure: ESOPHAGOGASTRODUODENOSCOPY (EGD);  Surgeon: Toney Reil, MD;  Location: [facility] ENDOSCOPY;  Service: Gastroenterology;  Laterality: N/A;   ESOPHAGOGASTRODUODENOSCOPY N/A 01/13/2018   Procedure: ESOPHAGOGASTRODUODENOSCOPY (EGD);  Surgeon: Toney Reil, MD;  Location: [facility] ENDOSCOPY;  Service: Gastroenterology;  Laterality: N/A;   ESOPHAGOGASTRODUODENOSCOPY (EGD) WITH PROPOFOL N/A 11/03/2017   Procedure: ESOPHAGOGASTRODUODENOSCOPY (EGD) WITH PROPOFOL;  Surgeon: Toney Reil, MD;  Location: [facility] ENDOSCOPY;  Service: Gastroenterology;  Laterality: N/A;   EYE SURGERY     HERNIA REPAIR     "navel"   LEFT HEART CATH AND CORONARY ANGIOGRAPHY N/A 08/14/2016   Procedure: Left Heart Cath and Coronary Angiography;  Surgeon: Yvonne Kendall, MD;  Location: [facility] INVASIVE CV LAB;  Service: Cardiovascular;  Laterality: N/A;   PRESSURE SENSOR/CARDIOMEMS N/A 09/23/2019   Procedure: PRESSURE SENSOR/CARDIOMEMS;  Surgeon: Dolores Patty, MD;  Location: [facility] INVASIVE CV LAB;  Service: Cardiovascular;  Laterality: N/A;   RIGHT HEART CATH N/A 09/23/2019   Procedure: RIGHT HEART CATH;  Surgeon: Dolores Patty, MD;  Location: [facility]  INVASIVE CV LAB;  Service: Cardiovascular;  Laterality: N/A;   RIGHT/LEFT HEART CATH AND CORONARY ANGIOGRAPHY N/A 09/01/2018   Procedure: RIGHT/LEFT HEART CATH AND CORONARY ANGIOGRAPHY;  Surgeon: Yvonne Kendall, MD;  Location: [facility] INVASIVE CV LAB;  Service: Cardiovascular;  Laterality: N/A;   RIGHT/LEFT HEART CATH AND CORONARY ANGIOGRAPHY N/A 04/13/2019   Procedure: RIGHT/LEFT HEART CATH AND CORONARY ANGIOGRAPHY;  Surgeon: Yvonne Kendall, MD;  Location: [facility] INVASIVE CV LAB;  Service: Cardiovascular;  Laterality: N/A;   TOE AMPUTATION Right    "big toe"     Allergies:   Allergies  Allergen Reactions   Clopidogrel Anaphylaxis and Other (See Comments)    altered mental status;  (electrolytes "out of wack" and confusion per family; THEY DO NOT REMEMBER ANY OTHER REACTION)- MSB 10/10/15   Ciprofloxacin Itching   Beta Adrenergic Blockers     Nightmares   Mirtazapine Diarrhea    Bad dreams    Spironolactone Other (See Comments)    gynecomastia     Social History:  reports that he quit smoking about 49 years ago. His smoking use included cigarettes. He started smoking about 69 years ago. He has a 35.00 pack-year  smoking history. He has quit using smokeless tobacco. He reports that he does not drink alcohol and does not use drugs.   Family History: Unknown  Physical Exam: Vitals:   08/11/22 0422 08/11/22 0500 08/11/22 0724 08/11/22 0800  BP: 123/85  131/77 135/81  Pulse: 100 98 99 99  Resp: 17  16 18   Temp: 98.2 F (36.8 C)  97.7 F (36.5 C)   TempSrc:   Oral   SpO2: 98%  99% 95%  Weight:  83.2 kg    Height:        Elderly chronically ill male laying in, fast asleep, withdraws to painful stimuli, pupils reactive HEENT: Positive JVD CVS: S1-S2, regular rhythm Lungs: Poor air movement bilaterally Abdomen: Soft, nontender,  bowel sounds present Extremities: No edema  Neuro: Somnolent, arouses to painful stimuli only,  Data reviewed:  I have personally reviewed  following labs and imaging studies Labs:  CBC: Recent Labs  Lab 08/09/22 1214 08/09/22 1221  HGB 12.6* 13.3  12.6*  HCT 37.0* 39.0  37.0*    Basic Metabolic Panel: Recent Labs  Lab 08/07/22 1203 08/09/22 1214 08/09/22 1221 08/10/22 0024  NA 132* 136 136  137 132*  K 3.6 4.0 4.1  3.9 4.4  CL 97*  --   --  98  CO2 24  --   --  23  GLUCOSE 180*  --   --  114*  BUN 32*  --   --  32*  CREATININE 1.87*  --   --  2.13*  CALCIUM 8.8*  --   --  8.7*  MG  --   --   --  2.6*   GFR Estimated Creatinine Clearance: 24.7 mL/min (A) (by C-G formula based on SCr of 2.13 mg/dL (H)). Liver Function Tests: No results for input(s): "AST", "ALT", "ALKPHOS", "BILITOT", "PROT", "ALBUMIN" in the last 168 hours. No results for input(s): "LIPASE", "AMYLASE" in the last 168 hours. No results for input(s): "AMMONIA" in the last 168 hours. Coagulation profile Recent Labs  Lab 08/09/22 1038 08/10/22 0024  INR 1.9* 1.8*    Cardiac Enzymes: No results for input(s): "CKTOTAL", "CKMB", "CKMBINDEX", "TROPONINI" in the last 168 hours. BNP: Invalid input(s): "POCBNP" CBG: Recent Labs  Lab 08/09/22 1023 08/09/22 1113 08/09/22 1333  GLUCAP 71 121* 88   D-Dimer No results for input(s): "DDIMER" in the last 72 hours. Hgb A1c No results for input(s): "HGBA1C" in the last 72 hours. Lipid Profile No results for input(s): "CHOL", "HDL", "LDLCALC", "TRIG", "CHOLHDL", "LDLDIRECT" in the last 72 hours. Thyroid function studies No results for input(s): "TSH", "T4TOTAL", "T3FREE", "THYROIDAB" in the last 72 hours.  Invalid input(s): "FREET3" Anemia work up No results for input(s): "VITAMINB12", "FOLATE", "FERRITIN", "TIBC", "IRON", "RETICCTPCT" in the last 72 hours. Urinalysis    Component Value Date/Time   COLORURINE YELLOW (A) 02/24/2022 1833   APPEARANCEUR Clear 07/09/2022 1531   LABSPEC 1.016 02/24/2022 1833   LABSPEC 1.010 05/17/2012 1442   PHURINE 6.0 02/24/2022 1833   GLUCOSEU 2+  (A) 07/09/2022 1531   GLUCOSEU >=500 05/17/2012 1442   HGBUR NEGATIVE 02/24/2022 1833   BILIRUBINUR negative 07/22/2022 1431   BILIRUBINUR Negative 07/09/2022 1531   BILIRUBINUR Negative 05/17/2012 1442   KETONESUR NEGATIVE 02/24/2022 1833   PROTEINUR Negative 07/22/2022 1431   PROTEINUR Negative 07/09/2022 1531   PROTEINUR NEGATIVE 02/24/2022 1833   UROBILINOGEN 0.2 07/22/2022 1431   NITRITE neg 07/22/2022 1431   NITRITE Negative 07/09/2022 1531   NITRITE NEGATIVE 02/24/2022 1833  LEUKOCYTESUR Negative 07/22/2022 1431   LEUKOCYTESUR Negative 07/09/2022 1531   LEUKOCYTESUR NEGATIVE 02/24/2022 1833   LEUKOCYTESUR Negative 05/17/2012 1442     Sepsis Labs No results for input(s): "WBC" in the last 168 hours.  Invalid input(s): "PROCALCITONIN", "LACTICIDVEN" Microbiology Recent Results (from the past 240 hour(s))  MRSA Next Gen by PCR, Nasal     Status: None   Collection Time: 08/09/22  2:28 PM   Specimen: Nasal Mucosa; Nasal Swab  Result Value Ref Range Status   MRSA by PCR Next Gen NOT DETECTED NOT DETECTED Final    Comment: (NOTE) The GeneXpert MRSA Assay (FDA approved for NASAL specimens only), is one component of a comprehensive MRSA colonization surveillance program. It is not intended to diagnose MRSA infection nor to guide or monitor treatment for MRSA infections. Test performance is not FDA approved in patients less than 64 years old. Performed at [facility], [address], [place], [place] [address]        Inpatient Medications:   Scheduled Meds:  allopurinol  100 mg Oral QPM   aspirin  81 mg Oral q AM   empagliflozin  10 mg Oral Daily   furosemide  80 mg Intravenous BID   haloperidol lactate  2 mg Intravenous Once   isosorbide mononitrate  30 mg Oral Daily   levothyroxine  25 mcg Oral Q0600   ranolazine  500 mg Oral BID   rosuvastatin  5 mg Oral QHS   sodium chloride flush  3 mL Intravenous Q12H   sodium chloride flush  3 mL Intravenous Q12H    sodium chloride flush  3 mL Intravenous Q12H   spironolactone  12.5 mg Oral Daily   tamsulosin  0.4 mg Oral QPC supper   Warfarin - Pharmacist Dosing Inpatient   Does not apply q1600   Continuous Infusions:  sodium chloride     sodium chloride     sodium chloride     sodium chloride     milrinone 0.125 mcg/kg/min (08/11/22 0900)     Radiological Exams on Admission: [place] EKG SITE RITE  Result Date: 08/10/2022 If Site Rite image not attached, placement could not be confirmed due to current cardiac rhythm.  CARDIAC CATHETERIZATION  Result Date: 08/09/2022   Mid LAD-2 lesion is 10% stenosed.   Mid RCA lesion is 40% stenosed.   Dist RCA lesion is 40% stenosed.   1st Diag lesion is 70% stenosed.   1st Mrg lesion is 30% stenosed.   RPDA lesion is 30% stenosed.   Ost Cx to Prox Cx lesion is 90% stenosed.   Mid LM to Prox LAD lesion is 80% stenosed.   Prox LAD lesion is 40% stenosed.   Mid LAD-1 lesion is 50% stenosed.   Prox RCA lesion is 30% stenosed.   Non-stenotic Mid Cx to Dist Cx lesion was previously treated.   Non-stenotic Prox RCA to Mid RCA lesion was previously treated. Findings: Ao = 101/58 (76) LV = 107/28 RA = 14 RV = 59/16 PA = 59/29 (39) PCW = 34 Fick cardiac output/index = 4.1/2.1 PVR = 1.2 WU Ao sat = 97% PA sat = 60%, 61% Assessment: 1. Severe CAD with high-grade severely calcified distal LM stenosis bifurcating into ostial LAD and LCX 2. EF 25% by echo 3. Elevated filling pressures with low cardiac output Plan/Discussion: I suspect he has end-stage heart disease with low output HF and elevated filling pressures not responding to aggressive medical therapy. [name] not CABG candidate. CAD  reviewed with interventional team - PCI would be prohibitive risk. Will admit for HF tune-up with milrinone and IV diuresis. Arvilla Meres, MD 12:59 PM   Impression/Recommendations  Agitation, confusion -Suspect delirium in the background of dementia, ongoing memory decline and intermittent  confusion especially in the evenings according to spouse for last 6 months -More agitated since last night -Delirium precautions, also check CBC, ammonia level, UA, bladder scan, hepatic function panel to rule out other culprits -Add low-dose Seroquel at night, repeat EKG tomorrow -Palliative consulted for goals of care  Acute on chronic systolic CHF Mixed cardiomyopathy -Admitted with refractory volume overload, now low output heart failure on right heart cath -Currently on Lasix and milrinone, along with Aldactone, Jardiance -Concern for ?end-stage cardiomyopathy -Poor candidate for advanced therapies with memory loss, CKD etc. -Discussed poor prognosis with wife and daughter at bedside, they are agreeable to DNR and palliative meeting for goals of care  CAD -Cath 5/24 noted severe CAD with high-grade distal LM stenosis, not a candidate for CABG  AKI on CKD3b -Mild uptrend in creatinine, on milrinone, monitor -Baseline 1.7-2  Type 2 diabetes mellitus -Check HbA1c -Check CBGs  CODE STATUS: Changed to DNR Discussed with wife and daughter at bedside  TRH will take over with heart failure team following as consult  Time Spent:  Zannie Cove M.D. [facility] 08/11/2022, 9:54 AM

## 2022-08-12 DIAGNOSIS — I5023 Acute on chronic systolic (congestive) heart failure: Secondary | ICD-10-CM | POA: Diagnosis not present

## 2022-08-12 LAB — BASIC METABOLIC PANEL
Anion gap: 12 (ref 5–15)
BUN: 29 mg/dL — ABNORMAL HIGH (ref 8–23)
CO2: 23 mmol/L (ref 22–32)
Calcium: 9.2 mg/dL (ref 8.9–10.3)
Chloride: 100 mmol/L (ref 98–111)
Creatinine, Ser: 2.02 mg/dL — ABNORMAL HIGH (ref 0.61–1.24)
GFR, Estimated: 32 mL/min — ABNORMAL LOW (ref >60)
Glucose, Bld: 142 mg/dL — ABNORMAL HIGH (ref 70–99)
Potassium: 3.7 mmol/L (ref 3.5–5.1)
Sodium: 135 mmol/L (ref 135–145)

## 2022-08-12 LAB — GLUCOSE, CAPILLARY
Glucose-Capillary: 119 mg/dL — ABNORMAL HIGH (ref 70–99)
Glucose-Capillary: 192 mg/dL — ABNORMAL HIGH (ref 70–99)
Glucose-Capillary: 153 mg/dL — ABNORMAL HIGH (ref 70–99)
Glucose-Capillary: 170 mg/dL — ABNORMAL HIGH (ref 70–99)

## 2022-08-12 LAB — MAGNESIUM: Magnesium: 2.3 mg/dL (ref 1.7–2.4)

## 2022-08-12 NOTE — TOC Initial Note (Addendum)
Transition of Care [facility]) - Initial/Assessment Note    Patient Details  Name: Lucas Caldwell MRN: [id number] Date of Birth: 04-07-1933  [facility]) CM/SW Contact:    Elliot Cousin, RN Phone Number:[phone] 08/12/2022, 12:44 PM  Clinical Narrative:  HF TOC CM spoke to pt, wife, dtr and son at bedside. Offered choice for Home Hospice. Medicare.gov list placed on chart. Family collectively, decided on [facility] for Home Hospice. Hospice referral call to rep, Revonda Standard. States she will speak to family. Scheduled dc tomorrow. Family states they are not requesting a hospital bed at this time. Updated attending and palliative NP.                Received message from [facility] rep, Watt Climes and she will speak to family. She will update when they can admit to Home Hospice post dc.  [facility] accepted, will do an admission with 24-48 hours of dc. Unit RN states pt gets short of breath. Currently not wearing oxygen. Updated [facility] rep, and they will set oxygen for pt at home. Attending updated.   Expected Discharge Plan: Home w Hospice Care Barriers to Discharge: Continued Medical Work up   Patient Goals and CMS Choice Patient states their goals for this hospitalization and ongoing recovery are:: wants to go home with Home Hospice CMS Medicare.gov Compare Post Acute Care list provided to:: Patient Represenative (must comment) (daughter- Taylar Denn) Choice offered to / list presented to : Adult Children, Patient, Spouse      Expected Discharge Plan and Services   Discharge Planning Services: CM Consult Post Acute Care Choice: Hospice Living arrangements for the past 2 months: Single Family Home                           HH Arranged: RN [facility] Agency: [facility] Date [facility] Agency Contacted: 08/12/22 Time HH Agency Contacted: [phone] Representative spoke with at [facility] Agency: Revonda Standard  Prior Living Arrangements/Services Living  arrangements for the past 2 months: Single Family Home Lives with:: Spouse Patient language and need for interpreter reviewed:: Yes Do you feel safe going back to the place where you live?: Yes      Need for Family Participation in Patient Care: No (Comment) Care giver support system in place?: Yes (comment) Current home services: DME (rolling walker, cane, bedside commode) Criminal Activity/Legal Involvement Pertinent to Current Situation/Hospitalization: No - Comment as needed  Activities of Daily Living      Permission Sought/Granted Permission sought to share information with : Case Manager, Family Supports, PCP Permission granted to share information with : Yes, Verbal Permission Granted  Share Information with NAME: Punit Bellaire  Permission granted to share info w AGENCY: Home Hospice  Permission granted to share info w Relationship: daughter  Permission granted to share info w Contact Information: [phone]  Emotional Assessment Appearance:: Appears stated age Attitude/Demeanor/Rapport: Engaged Affect (typically observed): Accepting Orientation: : Oriented to Self, Oriented to Place, Oriented to  Time, Oriented to Situation   Psych Involvement: No (comment)  Admission diagnosis:  Acute on chronic systolic (congestive) heart failure (HCC) [I50.23] Patient Active Problem List   Diagnosis Date Noted   Acute on chronic systolic (congestive) heart failure (HCC) 08/09/2022   PVC's (premature ventricular contractions) 11/23/2021   Chronic kidney disease, stage 3b (HCC) 11/23/2021   Amputated great toe, right (HCC) 03/05/2021   Diabetic foot infection (HCC)    Diabetic foot ulcer (HCC) 02/21/2021  Diabetic polyneuropathy associated with type 2 diabetes mellitus (HCC) 11/23/2020   Orthostatic hypotension 10/16/2020   Obstructive sleep apnea 06/29/2020   Urge incontinence of urine 12/22/2019   Trifascicular block 10/20/2019   Bifascicular block 05/13/2019   Coronary stent  patent 04/13/2019   Chronic HFrEF (heart failure with reduced ejection fraction) (HCC) 03/19/2019   PSVT (paroxysmal supraventricular tachycardia) 03/19/2019   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 (HCC) 08/24/2018   Osteoarthritis of knees, bilateral 06/02/2018   Hyperlipidemia associated with type 2 diabetes mellitus (HCC) 05/06/2018   Pain of lower extremity 03/06/2018   Duodenitis 01/30/2018   Hematochezia    Melena    Rectal bleeding 01/11/2018   AVM (arteriovenous malformation) of colon without hemorrhage    Schatzki's ring    Esophageal dysphagia    Diabetic retinopathy (HCC) 07/08/2017   Cardiomyopathy (HCC) 06/26/2017   Centrilobular emphysema (HCC) 06/13/2017   Fatigue 12/18/2016   Stable angina 09/20/2016   Stage 3a chronic kidney disease (HCC) 09/20/2016   Paroxysmal atrial fibrillation (HCC) 08/28/2016   Chronic anticoagulation 08/28/2016   Essential hypertension 08/20/2016   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 heart with stable angina pectoris (HCC) 08/19/2016   Exertional dyspnea 08/12/2016   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) 12/29/2015   Gynecomastia, male 03/31/2014   Hypertrophy of breast 03/31/2014   Nuclear sclerosis of left eye 10/22/2013   Enthesopathy of ankle and tarsus 04/10/2013   Controlled type 2 diabetes mellitus with diabetic nephropathy, with long-term current use of insulin (HCC) 04/09/2013   Diabetic neuropathy associated with type 2 diabetes mellitus (HCC) 04/09/2013   Cataract of left eye 11/25/2011   Corneal opacity 09/30/2011   Pseudophakia of right eye 09/30/2011   GERD (gastroesophageal reflux disease) 09/25/2011   Gout 09/25/2011   Hyperlipidemia LDL goal <70 09/25/2011   Status post cataract extraction 04/11/2011   Cataract extraction status of eye 04/11/2011   Iron deficiency anemia 04/01/2011   Benign localized hyperplasia of prostate without urinary obstruction and other lower urinary tract symptoms (LUTS)  12/10/2010   Enlarged prostate without lower urinary tract symptoms (luts) 12/10/2010   Colon cancer screening 10/11/2010   Hypothyroidism 10/11/2010   Hyperlipidemia 09/20/2003   Essential and other specified forms of tremor 01/05/2001   Essential tremor 01/05/2001   PCP:  Smitty Cords, DO Pharmacy:   [facility] - [place], [place] - [place] - [address] [place] [address] Phone: [phone] Fax: [phone]  [facility] - [place], [place] - [address] [place] [address] Phone: [phone] Fax: [phone]  [id number] [place], [place] - [address] [place] [address] Phone: [phone] Fax: [phone]  [facility] - [place], [place] - [address] [place] [address] Phone: [phone] Fax: [phone]     Social Determinants of Health (SDOH) Social History: SDOH Screenings   Food Insecurity: No Food Insecurity (06/10/2022)  Housing: Low Risk  (06/10/2022)  Transportation Needs: No Transportation Needs (06/10/2022)  Utilities: Not At Risk (06/10/2022)  Alcohol Screen: Low Risk  (06/10/2022)  Depression (PHQ2-9): Low Risk  (06/05/2022)  Financial Resource Strain: Low Risk  (06/10/2022)  Physical Activity: Inactive (06/10/2022)  Social Connections: Socially Integrated (06/10/2022)  Stress: No Stress Concern Present (06/10/2022)  Tobacco Use: Medium Risk (08/07/2022)   SDOH Interventions:  Readmission Risk Interventions     No data to display

## 2022-08-12 NOTE — Progress Notes (Signed)
PROGRESS NOTE    Lucas Caldwell  ZOX:[id number] DOB: 1933-07-19 DOA: 08/09/2022 PCP: Smitty Cords, DO   [name] with history of CAD, chronic systolic CHF, mixed cardiomyopathy, paroxysmal A-fib on Coumadin, CKD 3b, chronic myelocytic leukemia, hypertension and ongoing cognitive decline, early dementia was admitted 5/24 per CHF team, for persistent volume overload despite escalating doses of diuretics, admitted, underwent right and left heart cath noted to have elevated filling pressures, low cardiac index, high-grade distal left main disease, not candidate for CABG -5/25, patient developed more confusion, some agitation, worsened today, with hallucination, more agitated pulling monitors, IV etc. no fevers or chills, vitals relatively stable -TRH consulted for medical management -Hospital course complicated by delirium and agitation, requiring Ativan and Haldol  Subjective: -Clear this morning, denies any complaints  Assessment and Plan:  Agitation, confusion -Suspect delirium in the background of dementia, ongoing memory decline and intermittent confusion especially in the evenings according to spouse for last 6 months -Was more agitated 5/25 and 5/26, stable this morning -CBC, UA, hepatic function panel, ammonia level all unremarkable yesterday  -Low-dose Seroquel added yesterday  -More lucid today, palliative consulted for goals of care   Acute on chronic systolic CHF Mixed cardiomyopathy -Admitted with refractory volume overload, complicated by low output heart failure on right heart cath -Was started on Lasix, milrinone along with Jardiance and Aldactone -Per CHF team concern for end-stage cardiomyopathy, not a candidate for advanced therapies with advanced age, memory loss and CKD -Discussed poor prognosis with wife and daughter yesterday, palliative consulting, hospice being considered   CAD -Cath 5/24 noted severe CAD with high-grade distal LM stenosis, not a  candidate for CABG   AKI on CKD3b -Mild uptrend in creatinine, on milrinone, monitor -Baseline 1.7-2   Type 2 diabetes mellitus -CBGs are stable   CODE STATUS: Changed to DNR Discussed with wife and daughter at bedside 5/26    DVT prophylaxis: SCDs, conservative management Code Status: DNR Family Communication: Wife at bedside Disposition Plan: To be determined, possibly home with hospice  Consultants:    Procedures:   Antimicrobials:    Objective: Vitals:   08/12/22 0200 08/12/22 0300 08/12/22 0400 08/12/22 0700  BP: 120/86 130/79 113/76 104/66  Pulse: (!) 102 98 (!) 224 98  Resp: 18 16 15 17   Temp:  98.8 F (37.1 C)  97.6 F (36.4 C)  TempSrc:  Oral  Oral  SpO2: 93% 95% 92%   Weight:      Height:        Intake/Output Summary (Last 24 hours) at 08/12/2022 1111 Last data filed at 08/11/2022 2200 Gross per 24 hour  Intake 5.19 ml  Output 1100 ml  Net -1094.81 ml   Filed Weights   08/09/22 1424 08/10/22 0425 08/11/22 0500  Weight: 87.8 kg 85.3 kg 83.2 kg    Examination:  General exam: Elderly male sitting up in bed, more lucid this morning, oriented to self and place HEENT: Positive JVD CVS: S1-S2, regular rhythm Lungs: Decreased breath sounds to bases Abdomen: Soft, nontender, bowel sounds present Extremities: No edema Skin: No rashes Psychiatry: Flat affect, poor insight and judgment    Data Reviewed:   CBC: Recent Labs  Lab 08/09/22 1214 08/09/22 1221 08/11/22 1127  WBC  --   --  9.6  HGB 12.6* 13.3  12.6* 13.2  HCT 37.0* 39.0  37.0* 39.7  MCV  --   --  84.8  PLT  --   --  197   Basic Metabolic Panel:  Recent Labs  Lab 08/07/22 1203 08/09/22 1214 08/09/22 1221 08/10/22 0024 08/12/22 0027  NA 132* 136 136  137 132* 135  K 3.6 4.0 4.1  3.9 4.4 3.7  CL 97*  --   --  98 100  CO2 24  --   --  23 23  GLUCOSE 180*  --   --  114* 142*  BUN 32*  --   --  32* 29*  CREATININE 1.87*  --   --  2.13* 2.02*  CALCIUM 8.8*  --   --   8.7* 9.2  MG  --   --   --  2.6* 2.3   GFR: Estimated Creatinine Clearance: 26.1 mL/min (A) (by C-G formula based on SCr of 2.02 mg/dL (H)). Liver Function Tests: Recent Labs  Lab 08/11/22 1141  AST 29  ALT 23  ALKPHOS 72  BILITOT 1.1  PROT 8.2*  ALBUMIN 3.7   No results for input(s): "LIPASE", "AMYLASE" in the last 168 hours. Recent Labs  Lab 08/11/22 1141  AMMONIA 36*   Coagulation Profile: Recent Labs  Lab 08/09/22 1038 08/10/22 0024  INR 1.9* 1.8*   Cardiac Enzymes: No results for input(s): "CKTOTAL", "CKMB", "CKMBINDEX", "TROPONINI" in the last 168 hours. BNP (last 3 results) No results for input(s): "PROBNP" in the last 8760 hours. HbA1C: No results for input(s): "HGBA1C" in the last 72 hours. CBG: Recent Labs  Lab 08/11/22 1557 08/11/22 1958 08/11/22 2306 08/12/22 0306 08/12/22 1105  GLUCAP 116* 156* 143* 119* 192*   Lipid Profile: No results for input(s): "CHOL", "HDL", "LDLCALC", "TRIG", "CHOLHDL", "LDLDIRECT" in the last 72 hours. Thyroid Function Tests: No results for input(s): "TSH", "T4TOTAL", "FREET4", "T3FREE", "THYROIDAB" in the last 72 hours. Anemia Panel: No results for input(s): "VITAMINB12", "FOLATE", "FERRITIN", "TIBC", "IRON", "RETICCTPCT" in the last 72 hours. Urine analysis:    Component Value Date/Time   COLORURINE YELLOW 08/11/2022 1243   APPEARANCEUR CLEAR 08/11/2022 1243   APPEARANCEUR Clear 07/09/2022 1531   LABSPEC 1.011 08/11/2022 1243   LABSPEC 1.010 05/17/2012 1442   PHURINE 6.0 08/11/2022 1243   GLUCOSEU >=500 (A) 08/11/2022 1243   GLUCOSEU >=500 05/17/2012 1442   HGBUR NEGATIVE 08/11/2022 1243   BILIRUBINUR NEGATIVE 08/11/2022 1243   BILIRUBINUR negative 07/22/2022 1431   BILIRUBINUR Negative 07/09/2022 1531   BILIRUBINUR Negative 05/17/2012 1442   KETONESUR NEGATIVE 08/11/2022 1243   PROTEINUR NEGATIVE 08/11/2022 1243   UROBILINOGEN 0.2 07/22/2022 1431   NITRITE NEGATIVE 08/11/2022 1243   LEUKOCYTESUR NEGATIVE  08/11/2022 1243   LEUKOCYTESUR Negative 05/17/2012 1442   Sepsis Labs: @LABRCNTIP (procalcitonin:4,lacticidven:4)  ) Recent Results (from the past 240 hour(s))  MRSA Next Gen by PCR, Nasal     Status: None   Collection Time: 08/09/22  2:28 PM   Specimen: Nasal Mucosa; Nasal Swab  Result Value Ref Range Status   MRSA by PCR Next Gen NOT DETECTED NOT DETECTED Final    Comment: (NOTE) The GeneXpert MRSA Assay (FDA approved for NASAL specimens only), is one component of a comprehensive MRSA colonization surveillance program. It is not intended to diagnose MRSA infection nor to guide or monitor treatment for MRSA infections. Test performance is not FDA approved in patients less than 34 years old. Performed at [facility], [address], [place], [place] [address]      Radiology Studies: [place] EKG SITE RITE  Result Date: 08/10/2022 If [facility] image not attached, placement could not be confirmed due to current cardiac rhythm.    Scheduled  Meds:  allopurinol  100 mg Oral QPM   aspirin  81 mg Oral q AM   furosemide  80 mg Intravenous BID   insulin aspart  0-6 Units Subcutaneous TID WC   QUEtiapine  25 mg Oral QHS   sodium chloride flush  3 mL Intravenous Q12H   sodium chloride flush  3 mL Intravenous Q12H   sodium chloride flush  3 mL Intravenous Q12H   spironolactone  12.5 mg Oral Daily   tamsulosin  0.4 mg Oral QPC supper   Continuous Infusions:  sodium chloride     sodium chloride     sodium chloride     sodium chloride       LOS: 3 days    Time spent:    Zannie Cove, MD [facility]   08/12/2022, 11:11 AM

## 2022-08-12 NOTE — Progress Notes (Signed)
This chaplain responded to PMT NP-Dawn K consult for spiritual care. The chaplain was updated by the Pt. RN-[place] before the visit.    The Pt. is sitting up in the bedside recliner, surrounded by multiple generations of family and sharing his joyful spirit. The Pt. anticipates more family to visit today. Education was provided to the family and RN on how to page the chaplain at the appropriate time to F/U spiritual care.  The chaplain understands the Pt. daughter is hoping PMT-Dawn will revisit to review the previous day's conversation. The RN sent a secure chat requesting the NP presence.   Chaplain Stephanie Acre [phone]

## 2022-08-12 NOTE — Progress Notes (Signed)
Advanced Heart Failure Rounding Note   Subjective:    Cath 5/24 with volume overload and low output. As well as high grade distal LM bifurcation CAD.  Now on milrinone. 0.125 and IV lasix. Diuresed well. Weight down 5 pounds.  This AM, receiving a bath, calm. Wife at bedside.   Objective:   Weight Range:  Vital Signs:   Temp:  [97.6 F (36.4 C)-98.8 F (37.1 C)] 97.6 F (36.4 C) (05/27 0700) Pulse Rate:  [87-224] 98 (05/27 0700) Resp:  [14-26] 17 (05/27 0700) BP: (104-139)/(66-93) 104/66 (05/27 0700) SpO2:  [92 %-100 %] 92 % (05/27 0400) Last BM Date : 08/08/22  Weight change: Filed Weights   08/09/22 1424 08/10/22 0425 08/11/22 0500  Weight: 87.8 kg 85.3 kg 83.2 kg    Intake/Output:   Intake/Output Summary (Last 24 hours) at 08/12/2022 1203 Last data filed at 08/11/2022 2200 Gross per 24 hour  Intake 5.19 ml  Output 950 ml  Net -944.81 ml      Physical Exam: General:  Sitting in chair drowsy  No resp difficulty HEENT: normal Neck: supple. JVP hard to see Carotids 2+ bilat; no bruits. No lymphadenopathy or thryomegaly appreciated. Cor: Regular rate & rhythm.2/6 MR Lungs: clear Abdomen: soft, nontender, nondistended. No hepatosplenomegaly. No bruits or masses. Good bowel sounds. Extremities: no cyanosis, clubbing, rash, edema Neuro: drowsy confused  Telemetry: sinus 90-110 Personally reviewed   Labs: Basic Metabolic Panel: Recent Labs  Lab 08/07/22 1203 08/09/22 1214 08/09/22 1221 08/10/22 0024 08/12/22 0027  NA 132* 136 136  137 132* 135  K 3.6 4.0 4.1  3.9 4.4 3.7  CL 97*  --   --  98 100  CO2 24  --   --  23 23  GLUCOSE 180*  --   --  114* 142*  BUN 32*  --   --  32* 29*  CREATININE 1.87*  --   --  2.13* 2.02*  CALCIUM 8.8*  --   --  8.7* 9.2  MG  --   --   --  2.6* 2.3     Liver Function Tests: Recent Labs  Lab 08/11/22 1141  AST 29  ALT 23  ALKPHOS 72  BILITOT 1.1  PROT 8.2*  ALBUMIN 3.7   No results for input(s):  "LIPASE", "AMYLASE" in the last 168 hours. Recent Labs  Lab 08/11/22 1141  AMMONIA 36*    CBC: Recent Labs  Lab 08/09/22 1214 08/09/22 1221 08/11/22 1127  WBC  --   --  9.6  HGB 12.6* 13.3  12.6* 13.2  HCT 37.0* 39.0  37.0* 39.7  MCV  --   --  84.8  PLT  --   --  197     Cardiac Enzymes: No results for input(s): "CKTOTAL", "CKMB", "CKMBINDEX", "TROPONINI" in the last 168 hours.  BNP: BNP (last 3 results) Recent Labs    07/23/22 1041 08/02/22 1315 08/07/22 1203  BNP 656.2* 583.6* 710.9*     ProBNP (last 3 results) No results for input(s): "PROBNP" in the last 8760 hours.    Other results:  Imaging: No results found.   Medications:     Scheduled Medications:  allopurinol  100 mg Oral QPM   aspirin  81 mg Oral q AM   furosemide  80 mg Intravenous BID   insulin aspart  0-6 Units Subcutaneous TID WC   QUEtiapine  25 mg Oral QHS   sodium chloride flush  3 mL Intravenous Q12H   sodium  chloride flush  3 mL Intravenous Q12H   sodium chloride flush  3 mL Intravenous Q12H   spironolactone  12.5 mg Oral Daily   tamsulosin  0.4 mg Oral QPC supper    Infusions:  sodium chloride     sodium chloride     sodium chloride     sodium chloride      PRN Medications: sodium chloride, sodium chloride, sodium chloride, acetaminophen, nitroGLYCERIN, ondansetron (ZOFRAN) IV, mouth rinse, sodium chloride flush, sodium chloride flush, sodium chloride flush   Assessment/Plan:   1. Acute/chronic systolic HF: - likely combination of iCM/NICM - Echo (2/21): EF 30-35%  (was 35-40% in 6/20) - RHC (04/13/19): with elevated filling pressures but BNP normal  - Echo (8/22): EF 30-35%, grade I DD, RV low normal - Echo 11/23 EF 30-35% - Continue to struggle with NYHA IIIB-IV and decreasing doses of GDMT - Markedly volume overloaded on cath with low output  - Stopped Toprol  - Stopped losartan with AKI - Continue Jardiance 10 mg daily - [name] end-stage.Not candidate for  advanced therapies with age, CKD and progressive memory issues. - Have engaged Palliative Care and TRH with impending plan to switch to comfort care  - 1.2L urine output yesterday on lasix 80mg  IV BID. sCr 2.02.  - Milrinone now discontinued; palliative care. Can continue lasix 80mg  IV through today. Switch to torsemide 80mg  qAM, 40mg  qHS tomorrow.    2. CAD - Cath 04/13/19 LAD 70% with +FFR otherwise diffuse non-obstructive CAD  - S/p PCI/DES to LAD 1/21 - Followed by Dr. Okey Dupre - No current s/s angina - Cath 08/09/22: Severe CAD with high-grade severely calcified distal LM stenosis bifurcating into ostial LAD and LCX. [name] not CABG candidate. CAD reviewed with interventional team - PCI would be prohibitive risk and likely not do much to improve his HF which seems currently to be his major limitation - Pending switch to comfort care. Will stop all nonessential meds   3. CKD Stage 3b - baseline SCr ~1.7-2.0 -> 2.1 -> pending - follow closely   4. DM  - Continue Jardiance 10 mg daily. - No GU symptoms   5. PAF - Zio patch 4/21 showed no AF. Mostly NSR w/ brief SVT/ NSVT, PACs and PVCs - Remains in NSR - Stop warfarin as risk for bleeding is high   6. Dementia/acute delirium - progressive - received haldol and ativan today - TRH engaged in care  7. DNR/DNI - D/w Palliative Care at bedside - plan transition to comfort care   Length of Stay: 3   Dorthula Nettles MD 08/12/2022, 12:03 PM  Advanced Heart Failure Team Pager [phone] (M-F; 7a - 4p)  Please contact [facility] for night-coverage after hours (4p -7a ) and weekends on [email or web address]

## 2022-08-12 NOTE — Plan of Care (Signed)
  Problem: Cardiovascular: Goal: Ability to achieve and maintain adequate cardiovascular perfusion will improve Outcome: Progressing Goal: Vascular access site(s) Level 0-1 will be maintained Outcome: Progressing   Problem: Clinical Measurements: Goal: Will remain free from infection Outcome: Progressing Goal: Diagnostic test results will improve Outcome: Progressing Goal: Respiratory complications will improve Outcome: Progressing Goal: Cardiovascular complication will be avoided Outcome: Progressing   Problem: Activity: Goal: Risk for activity intolerance will decrease Outcome: Progressing   Problem: Coping: Goal: Level of anxiety will decrease Outcome: Progressing   Problem: Elimination: Goal: Will not experience complications related to urinary retention Outcome: Progressing   Problem: Pain Managment: Goal: General experience of comfort will improve Outcome: Progressing   Problem: Safety: Goal: Ability to remain free from injury will improve Outcome: Progressing

## 2022-08-12 NOTE — Progress Notes (Signed)
Civil engineer, contracting [facility]) Hospital Liaison Note  Received request from Transitions of Care Manager, Cathlean Cower, for hospice services at home after discharge. Chart and patient information under review by [facility] physician.    Spoke with daughter/Ann & spouse/[name] to initiate education related to hospice philosophy, services, and team approach to care. Both verbalized understanding of information given. Per discussion, the plan is for patient to discharge home via POV once cleared to DC.    DME needs discussed. Patient has the following equipment in the home N.a Per TOC/Alesia , RN/Ovais recommended O2 as patient is very SOB with little exertion. Dr. Jomarie Longs in agreement and recommends [facility] order O2 @ 2L. Order has been placed.   Address verified and is correct in the chart. Dewayne Hatch is the family member to contact to arrange time of equipment delivery.    Please send signed and completed DNR home with patient/family. Please provide prescriptions at discharge as needed to ensure ongoing symptom management.    [facility] information and contact numbers given to family & above information shared with TOC.   Please call with any questions/concerns.    Thank you for the opportunity to participate in this patient's care.   Eugenie Birks, MSW [facility] Liaison  [phone]

## 2022-08-12 NOTE — Progress Notes (Signed)
Daily Progress Note   Patient Name: Lucas Caldwell       Date: 08/12/2022 DOB: 05/10/1933  Age: 89 y.o. MRN#: [id number] Attending Physician: Zannie Cove, MD Primary Care Physician: Smitty Cords, DO Admit Date: 08/09/2022  Reason for Consultation/Follow-up: Establishing goals of care  Subjective: Patient was sitting up in bed. He reports he feels much better than yesterday. His wife was at bedside.  Length of Stay: 3  Current Medications: Scheduled Meds:   allopurinol  100 mg Oral QPM   aspirin  81 mg Oral q AM   furosemide  80 mg Intravenous BID   insulin aspart  0-6 Units Subcutaneous TID WC   QUEtiapine  25 mg Oral QHS   sodium chloride flush  3 mL Intravenous Q12H   sodium chloride flush  3 mL Intravenous Q12H   sodium chloride flush  3 mL Intravenous Q12H   spironolactone  12.5 mg Oral Daily   tamsulosin  0.4 mg Oral QPC supper    Continuous Infusions:  sodium chloride     sodium chloride     sodium chloride     sodium chloride      PRN Meds: sodium chloride, sodium chloride, sodium chloride, acetaminophen, nitroGLYCERIN, ondansetron (ZOFRAN) IV, mouth rinse, sodium chloride flush, sodium chloride flush, sodium chloride flush  Physical Exam Pulmonary:     Effort: Pulmonary effort is normal.  Skin:    General: Skin is warm and dry.  Neurological:     Mental Status: [name] alert.  Psychiatric:        Mood and Affect: Mood normal.        Behavior: Behavior normal.             Vital Signs: BP 104/66 (BP Location: Left Arm)   Pulse 98   Temp 97.6 F (36.4 C) (Oral)   Resp 17   Ht 5\' 5"  (1.651 m)   Wt 83.2 kg   SpO2 92%   BMI 30.52 kg/m  SpO2: SpO2: 92 % O2 Device: O2 Device: Room Air O2 Flow Rate:    Intake/output summary:   Intake/Output Summary (Last 24 hours) at 08/12/2022 0959 Last data filed at 08/11/2022 2200 Gross per 24 hour  Intake 11.6 ml  Output 1100 ml  Net -1088.4 ml   LBM: Last BM Date :  08/08/22 Baseline Weight: Weight: 87.5 kg Most recent weight: Weight: 83.2 kg       Palliative Assessment/Data: 50%      Patient Active Problem List   Diagnosis Date Noted   Acute on chronic systolic (congestive) heart failure (HCC) 08/09/2022   PVC's (premature ventricular contractions) 11/23/2021   Chronic kidney disease, stage 3b (HCC) 11/23/2021   Amputated great toe, right (HCC) 03/05/2021   Diabetic foot infection (HCC)    Diabetic foot ulcer (HCC) 02/21/2021   Diabetic polyneuropathy associated with type 2 diabetes mellitus (HCC) 11/23/2020   Orthostatic hypotension 10/16/2020   Obstructive sleep apnea 06/29/2020   Urge incontinence of urine 12/22/2019   Trifascicular block 10/20/2019   Bifascicular block 05/13/2019   Coronary stent patent 04/13/2019   Chronic HFrEF (heart failure with reduced ejection fraction) (HCC) 03/19/2019   PSVT (paroxysmal supraventricular tachycardia) 03/19/2019   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 (HCC) 08/24/2018   Osteoarthritis of knees, bilateral 06/02/2018   Hyperlipidemia associated with type 2 diabetes mellitus (HCC) 05/06/2018   Pain of lower extremity 03/06/2018   Duodenitis 01/30/2018   Hematochezia    Melena    Rectal bleeding 01/11/2018   AVM (arteriovenous malformation) of colon without hemorrhage    Schatzki's ring    Esophageal dysphagia    Diabetic retinopathy (HCC) 07/08/2017   Cardiomyopathy (HCC) 06/26/2017   Centrilobular emphysema (HCC) 06/13/2017   Fatigue 12/18/2016   Stable angina 09/20/2016   Stage 3a chronic kidney disease (HCC) 09/20/2016   Paroxysmal atrial fibrillation (HCC) 08/28/2016   Chronic anticoagulation 08/28/2016   Essential hypertension 08/20/2016   Coronary artery disease of native artery of  native heart with stable angina pectoris (HCC) 08/19/2016   Exertional dyspnea 08/12/2016   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) 12/29/2015   Gynecomastia, male 03/31/2014   Hypertrophy of breast 03/31/2014   Nuclear sclerosis of left eye 10/22/2013   Enthesopathy of ankle and tarsus 04/10/2013   Controlled type 2 diabetes mellitus with diabetic nephropathy, with long-term current use of insulin (HCC) 04/09/2013   Diabetic neuropathy associated with type 2 diabetes mellitus (HCC) 04/09/2013   Cataract of left eye 11/25/2011   Corneal opacity 09/30/2011   Pseudophakia of right eye 09/30/2011   GERD (gastroesophageal reflux disease) 09/25/2011   Gout 09/25/2011   Hyperlipidemia LDL goal <70 09/25/2011   Status post cataract extraction 04/11/2011   Cataract extraction status of eye 04/11/2011   Iron deficiency anemia 04/01/2011   Benign localized hyperplasia of prostate without urinary obstruction and other lower urinary tract symptoms (LUTS) 12/10/2010   Enlarged prostate without lower urinary tract symptoms (luts) 12/10/2010   Colon cancer screening 10/11/2010   Hypothyroidism 10/11/2010   Hyperlipidemia 09/20/2003   Essential and other specified forms of tremor 01/05/2001   Essential tremor 01/05/2001    Palliative Care Assessment & Plan   Patient Profile: 89 y.o. male  with past medical history of CAD, chronic systolic CHF, mixed cardiomyopathy, paroxysmal A-fib on Coumadin, CKD 3b, chronic myelocytic leukemia, hypertension and ongoing cognitive decline, early dementia was admitted 08/09/2022 per CHF team, for persistent volume overload despite escalating doses of diuretics, admitted, underwent right and left heart cath noted to have elevated filling pressures, low cardiac index, high-grade distal left main disease, not candidate for CABG.   On 08/10/22 the patient developed more confusion, some agitation, worsened today, with hallucination, more agitated pulling monitors, IV  etc.  Assessment: The patient is much less agitated/delirious than yesterday. He says he feels much better  from yesterday. He does not report any discomfort or SOB. He has been conserving energy by sitting in the bed. [name] happy his wife is at bedside with him. He would like to get shaved today to "look sharp".   The patient's wife states she would like to discuss taking her husband home on hospice. The patient's daughter is not there so we will continue our conversation when she is present.  1145: Met with the patient and his family. We discussed the patient's declining health status and that the patient was not a candidate for advanced therapies. I told them we could keep the patient comfortable and manage his symptoms. We discussed the options of home hospice and inpatient hospice. We discussed that patient care falls on the family with home hospice. The family would like Home Hospice services.   Recommendations/Plan: DNR Discharge with home hospice PMT will continue to support holistically    Code Status:    Code Status Orders  (From admission, onward)           Start     Ordered   08/11/22 1157  Do not attempt resuscitation (DNR)  Continuous       Question Answer Comment  If patient has no pulse and is not breathing Do Not Attempt Resuscitation   If patient has a pulse and/or is breathing: Medical Treatment Goals LIMITED ADDITIONAL INTERVENTIONS: Use medication/IV fluids and cardiac monitoring as indicated; Do not use intubation or mechanical ventilation (DNI), also provide comfort medications.  Transfer to Progressive/Stepdown as indicated, avoid Intensive Care.   Consent: Discussion documented in EHR or advanced directives reviewed      08/11/22 1156           Code Status History     Date Active Date Inactive Code Status Order ID Comments User Context   08/09/2022 1249 08/11/2022 1156 Full Code [id number]  Sherald Hess, NP Inpatient   02/21/2021 1932 02/24/2021 1938  Full Code [id number]  Gertha Calkin, MD ED   09/23/2019 1411 09/23/2019 2249 Full Code [id number]  Bensimhon, Bevelyn Buckles, MD Inpatient   04/13/2019 1318 04/15/2019 2118 Full Code [id number]  Yvonne Kendall, MD Inpatient   09/01/2018 1218 09/01/2018 1822 Full Code [id number]  Yvonne Kendall, MD Inpatient   01/11/2018 0838 01/14/2018 1543 Full Code [id number]  Barbaraann Rondo, MD ED   12/18/2016 2140 12/20/2016 1933 Full Code [id number]  Altamese Dilling, MD Inpatient   09/19/2016 1700 09/20/2016 2059 Full Code [id number]  Auburn Bilberry, MD Inpatient   08/15/2016 1649 08/20/2016 1929 Full Code [id number]  Ok Anis, NP Inpatient   08/12/2016 1408 08/15/2016 1405 Full Code [id number]  Gracelyn Nurse, MD Inpatient   12/08/2015 2021 12/11/2015 1315 Full Code [id number]  Katha Hamming, MD ED       Prognosis:  < 6 months  Discharge Planning: Home with Hospice  Care plan was discussed with bedside RN, Dr. Jomarie Longs, and Alden Benjamin.  Thank you for allowing the Palliative Medicine Team to assist in the care of this patient.  Time spent: 75 minutes  Detailed review of medical records ( labs, imaging, vital signs), medically appropriate exam, counseling and education to patient and his family, documenting clinical information, medication management, coordination of care.   Sherryll Burger, NP  Please contact Palliative Medicine Team phone at [phone] for questions and concerns.

## 2022-08-13 ENCOUNTER — Other Ambulatory Visit (HOSPITAL_COMMUNITY): Payer: Self-pay

## 2022-08-13 ENCOUNTER — Encounter (HOSPITAL_COMMUNITY): Payer: Self-pay | Admitting: Internal Medicine

## 2022-08-13 ENCOUNTER — Telehealth: Payer: Self-pay

## 2022-08-13 DIAGNOSIS — I5023 Acute on chronic systolic (congestive) heart failure: Secondary | ICD-10-CM | POA: Diagnosis not present

## 2022-08-13 LAB — BASIC METABOLIC PANEL
Anion gap: 14 (ref 5–15)
BUN: 27 mg/dL — ABNORMAL HIGH (ref 8–23)
CO2: 22 mmol/L (ref 22–32)
Calcium: 9.2 mg/dL (ref 8.9–10.3)
Chloride: 98 mmol/L (ref 98–111)
Creatinine, Ser: 1.84 mg/dL — ABNORMAL HIGH (ref 0.61–1.24)
GFR, Estimated: 35 mL/min — ABNORMAL LOW (ref >60)
Glucose, Bld: 136 mg/dL — ABNORMAL HIGH (ref 70–99)
Potassium: 3.6 mmol/L (ref 3.5–5.1)
Sodium: 134 mmol/L — ABNORMAL LOW (ref 135–145)

## 2022-08-13 LAB — GLUCOSE, CAPILLARY: Glucose-Capillary: 132 mg/dL — ABNORMAL HIGH (ref 70–99)

## 2022-08-13 LAB — MAGNESIUM: Magnesium: 2.4 mg/dL (ref 1.7–2.4)

## 2022-08-13 MED ORDER — TORSEMIDE 20 MG PO TABS
ORAL_TABLET | ORAL | 0 refills | Status: DC
  Filled 2022-08-13: qty 180, 30d supply, fill #0

## 2022-08-13 MED ORDER — QUETIAPINE FUMARATE 25 MG PO TABS
25.0000 mg | ORAL_TABLET | Freq: Every day | ORAL | 0 refills | Status: DC
  Filled 2022-08-13: qty 30, 30d supply, fill #0

## 2022-08-13 NOTE — Progress Notes (Signed)
AVS instructions were reviewed with grandson and wife at bedside.   Medications were delivered from pharmacy.  All questions answered.

## 2022-08-13 NOTE — Progress Notes (Signed)
Daily Progress Note   Patient Name: Lucas Caldwell       Date: 08/13/2022 DOB: 1933-07-05  Age: 89 y.o. MRN#: [id number] Attending Physician: Zannie Cove, MD Primary Care Physician: Smitty Cords, DO Admit Date: 08/09/2022  Reason for Consultation/Follow-up: Establishing goals of care  Subjective: Patient was laying in bed. [name] worried about his wife and getting home to her.  Length of Stay: 4  Current Medications: Scheduled Meds:   allopurinol  100 mg Oral QPM   aspirin  81 mg Oral q AM   furosemide  80 mg Intravenous BID   insulin aspart  0-6 Units Subcutaneous TID WC   QUEtiapine  25 mg Oral QHS   sodium chloride flush  3 mL Intravenous Q12H   sodium chloride flush  3 mL Intravenous Q12H   sodium chloride flush  3 mL Intravenous Q12H   spironolactone  12.5 mg Oral Daily   tamsulosin  0.4 mg Oral QPC supper    Continuous Infusions:  sodium chloride     sodium chloride     sodium chloride     sodium chloride      PRN Meds: sodium chloride, sodium chloride, sodium chloride, acetaminophen, nitroGLYCERIN, ondansetron (ZOFRAN) IV, mouth rinse, sodium chloride flush, sodium chloride flush, sodium chloride flush  Physical Exam Constitutional:      Appearance: [name] ill-appearing.  Pulmonary:     Effort: Pulmonary effort is normal.  Skin:    General: Skin is warm and dry.  Neurological:     Mental Status: [name] alert.  Psychiatric:        Behavior: Behavior normal.             Vital Signs: BP 116/76   Pulse (!) 107   Temp 98.6 F (37 C) (Oral)   Resp (!) 24   Ht 5\' 5"  (1.651 m)   Wt 81.4 kg   SpO2 93%   BMI 29.86 kg/m  SpO2: SpO2: 93 % O2 Device: O2 Device: Room Air O2 Flow Rate:    Intake/output summary:  Intake/Output Summary (Last 24  hours) at 08/13/2022 1036 Last data filed at 08/13/2022 0400 Gross per 24 hour  Intake 360 ml  Output 2350 ml  Net -1990 ml   LBM: Last BM Date : 08/12/22 Baseline Weight: Weight: 87.5 kg  Most recent weight: Weight: 81.4 kg       Palliative Assessment/Data: 50%      Patient Active Problem List   Diagnosis Date Noted   Acute on chronic systolic (congestive) heart failure (HCC) 08/09/2022   PVC's (premature ventricular contractions) 11/23/2021   Chronic kidney disease, stage 3b (HCC) 11/23/2021   Amputated great toe, right (HCC) 03/05/2021   Diabetic foot infection (HCC)    Diabetic foot ulcer (HCC) 02/21/2021   Diabetic polyneuropathy associated with type 2 diabetes mellitus (HCC) 11/23/2020   Orthostatic hypotension 10/16/2020   Obstructive sleep apnea 06/29/2020   Urge incontinence of urine 12/22/2019   Trifascicular block 10/20/2019   Bifascicular block 05/13/2019   Coronary stent patent 04/13/2019   Chronic HFrEF (heart failure with reduced ejection fraction) (HCC) 03/19/2019   PSVT (paroxysmal supraventricular tachycardia) 03/19/2019   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 (HCC) 08/24/2018   Osteoarthritis of knees, bilateral 06/02/2018   Hyperlipidemia associated with type 2 diabetes mellitus (HCC) 05/06/2018   Pain of lower extremity 03/06/2018   Duodenitis 01/30/2018   Hematochezia    Melena    Rectal bleeding 01/11/2018   AVM (arteriovenous malformation) of colon without hemorrhage    Schatzki's ring    Esophageal dysphagia    Diabetic retinopathy (HCC) 07/08/2017   Cardiomyopathy (HCC) 06/26/2017   Centrilobular emphysema (HCC) 06/13/2017   Fatigue 12/18/2016   Stable angina 09/20/2016   Stage 3a chronic kidney disease (HCC) 09/20/2016   Paroxysmal atrial fibrillation (HCC) 08/28/2016   Chronic anticoagulation 08/28/2016   Essential hypertension 08/20/2016   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 heart with stable angina  pectoris (HCC) 08/19/2016   Exertional dyspnea 08/12/2016   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) 12/29/2015   Gynecomastia, male 03/31/2014   Hypertrophy of breast 03/31/2014   Nuclear sclerosis of left eye 10/22/2013   Enthesopathy of ankle and tarsus 04/10/2013   Controlled type 2 diabetes mellitus with diabetic nephropathy, with long-term current use of insulin (HCC) 04/09/2013   Diabetic neuropathy associated with type 2 diabetes mellitus (HCC) 04/09/2013   Cataract of left eye 11/25/2011   Corneal opacity 09/30/2011   Pseudophakia of right eye 09/30/2011   GERD (gastroesophageal reflux disease) 09/25/2011   Gout 09/25/2011   Hyperlipidemia LDL goal <70 09/25/2011   Status post cataract extraction 04/11/2011   Cataract extraction status of eye 04/11/2011   Iron deficiency anemia 04/01/2011   Benign localized hyperplasia of prostate without urinary obstruction and other lower urinary tract symptoms (LUTS) 12/10/2010   Enlarged prostate without lower urinary tract symptoms (luts) 12/10/2010   Colon cancer screening 10/11/2010   Hypothyroidism 10/11/2010   Hyperlipidemia 09/20/2003   Essential and other specified forms of tremor 01/05/2001   Essential tremor 01/05/2001    Palliative Care Assessment & Plan   Patient Profile: 89 y.o. male with past medical history of CAD, chronic systolic CHF, mixed cardiomyopathy, paroxysmal A-fib on Coumadin, CKD 3b, chronic myelocytic leukemia, hypertension and ongoing cognitive decline, early dementia was admitted 08/09/2022 per CHF team, for persistent volume overload despite escalating doses of diuretics, admitted, underwent right and left heart cath noted to have elevated filling pressures, low cardiac index, high-grade distal left main disease, not candidate for CABG.   Assessment: Patient was in bed. States he was comfortable except a little "chilly." [name] eager to get home to be with his wife. [name] concerned about her appointments today and would feel  better if he was with her. I reassured  him that his daughter would be with her and his grandson and his wife would be coming to get him today for discharge. I asked if he had any questions about the plan to go home with hospice and he did not.  Called Micah the patient's grandson's wife. They are on the way to pick him up. They do not have any questions at this time.    Recommendations/Plan: DNR Discharge with home hospice PMT will continue to support holistically    Code Status:    Code Status Orders  (From admission, onward)           Start     Ordered   08/11/22 1157  Do not attempt resuscitation (DNR)  Continuous       Question Answer Comment  If patient has no pulse and is not breathing Do Not Attempt Resuscitation   If patient has a pulse and/or is breathing: Medical Treatment Goals LIMITED ADDITIONAL INTERVENTIONS: Use medication/IV fluids and cardiac monitoring as indicated; Do not use intubation or mechanical ventilation (DNI), also provide comfort medications.  Transfer to Progressive/Stepdown as indicated, avoid Intensive Care.   Consent: Discussion documented in EHR or advanced directives reviewed      08/11/22 1156           Code Status History     Date Active Date Inactive Code Status Order ID Comments User Context   08/09/2022 1249 08/11/2022 1156 Full Code [id number]  Sherald Hess, NP Inpatient   02/21/2021 1932 02/24/2021 1938 Full Code [id number]  Gertha Calkin, MD ED   09/23/2019 1411 09/23/2019 2249 Full Code [id number]  Bensimhon, Bevelyn Buckles, MD Inpatient   04/13/2019 1318 04/15/2019 2118 Full Code [id number]  Yvonne Kendall, MD Inpatient   09/01/2018 1218 09/01/2018 1822 Full Code [id number]  Yvonne Kendall, MD Inpatient   01/11/2018 0838 01/14/2018 1543 Full Code [id number]  Barbaraann Rondo, MD ED   12/18/2016 2140 12/20/2016 1933 Full Code [id number]  Altamese Dilling, MD Inpatient   09/19/2016 1700 09/20/2016 2059 Full Code [id number]  Auburn Bilberry, MD  Inpatient   08/15/2016 1649 08/20/2016 1929 Full Code [id number]  Ok Anis, NP Inpatient   08/12/2016 1408 08/15/2016 1405 Full Code [id number]  Gracelyn Nurse, MD Inpatient   12/08/2015 2021 12/11/2015 1315 Full Code [id number]  Katha Hamming, MD ED       Prognosis:  < 6 months  Discharge Planning: Home with Hospice  Care plan was discussed with bedside RN  Thank you for allowing the Palliative Medicine Team to assist in the care of this patient.  Time spent: 45 minutes   Detailed review of medical records ( labs, imaging, vital signs), medically appropriate exam, counseling and education to patient and his family, documenting clinical information, medication management, coordination of care.    Sherryll Burger, NP  Please contact Palliative Medicine Team phone at [phone] for questions and concerns.

## 2022-08-13 NOTE — Telephone Encounter (Signed)
[facility] contacted me regarding this request.  He was referred by the hospital, not by me.  I agreed to serve as attending.  The diagnosis would be "Systolic Congestive Heart Failure" and life expectancy can be < 6 months. I do not have more precise information, and further details and records recent health updates would be from his Cardiology / Heart Failure team, on the chart from May 2024.  [name], DO [facility] 08/13/2022, 6:21 PM

## 2022-08-13 NOTE — Progress Notes (Signed)
Patient daughter, Dewayne Hatch requests for RN to call once patient received discharge orders, if today. Staff can call Ann at [phone] or the patient's grandson/and grandson's wife at [phone].

## 2022-08-13 NOTE — Progress Notes (Signed)
Spoke to daughter, family is on their way to transport patient.

## 2022-08-13 NOTE — Care Management Important Message (Signed)
Important Message  Patient Details  Name: GEOFREY HARBOLD MRN: [id number] Date of Birth: 02/28/1934   Medicare Important Message Given:  Yes Patient left prior to IM delivery will mail a copy to the patient home address.     Trinda Harlacher 08/13/2022, 2:43 PM

## 2022-08-13 NOTE — Progress Notes (Signed)
All personal belongings returned, patient dressed for discharge.

## 2022-08-13 NOTE — TOC Transition Note (Signed)
Transition of Care [facility]) - CM/SW Discharge Note   Patient Details  Name: Lucas Caldwell MRN: [id number] Date of Birth: 1933-05-23  [facility] CM/SW Contact:  Harriet Masson, RN Phone Number: 08/13/2022, 11:00 AM   Clinical Narrative:    Patient stable for discharge home with hospice, [facility]. Shanita with [facility] states home 02 will be delivered to the home. Family to transport home.    Final next level of care: Home w Hospice Care Barriers to Discharge: Barriers Resolved   Patient Goals and CMS Choice CMS Medicare.gov Compare Post Acute Care list provided to:: Patient Represenative (must comment) (daughter- Shahrukh Horrocks) Choice offered to / list presented to : Adult Children, Patient, Spouse  Discharge Placement                 home        Discharge Plan and Services Additional resources added to the After Visit Summary for     Discharge Planning Services: CM Consult Post Acute Care Choice: Hospice                    HH Arranged: RN [facility] Agency: [facility] Date [facility] Agency Contacted: 08/12/22 Time HH Agency Contacted: 1243 Representative spoke with at [facility] Agency: Revonda Standard  Social Determinants of Health (SDOH) Interventions SDOH Screenings   Food Insecurity: No Food Insecurity (06/10/2022)  Housing: Low Risk  (06/10/2022)  Transportation Needs: No Transportation Needs (06/10/2022)  Utilities: Not At Risk (06/10/2022)  Alcohol Screen: Low Risk  (06/10/2022)  Depression (PHQ2-9): Low Risk  (06/05/2022)  Financial Resource Strain: Low Risk  (06/10/2022)  Physical Activity: Inactive (06/10/2022)  Social Connections: Socially Integrated (06/10/2022)  Stress: No Stress Concern Present (06/10/2022)  Tobacco Use: Medium Risk (08/13/2022)     Readmission Risk Interventions     No data to display

## 2022-08-13 NOTE — Plan of Care (Signed)
Problem: Education: Goal: Understanding of CV disease, CV risk reduction, and recovery process will improve Outcome: Adequate for Discharge Goal: Individualized Educational Video(s) Outcome: Adequate for Discharge   Problem: Activity: Goal: Ability to return to baseline activity level will improve Outcome: Adequate for Discharge   Problem: Cardiovascular: Goal: Ability to achieve and maintain adequate cardiovascular perfusion will improve Outcome: Adequate for Discharge Goal: Vascular access site(s) Level 0-1 will be maintained Outcome: Adequate for Discharge   Problem: Health Behavior/Discharge Planning: Goal: Ability to safely manage health-related needs after discharge will improve Outcome: Adequate for Discharge   Problem: Cardiovascular: Goal: Ability to achieve and maintain adequate cardiovascular perfusion will improve Outcome: Adequate for Discharge Goal: Vascular access site(s) Level 0-1 will be maintained Outcome: Adequate for Discharge   Problem: Health Behavior/Discharge Planning: Goal: Ability to safely manage health-related needs after discharge will improve Outcome: Adequate for Discharge   Problem: Education: Goal: Knowledge of General Education information will improve Description: Including pain rating scale, medication(s)/side effects and non-pharmacologic comfort measures Outcome: Adequate for Discharge   Problem: Education: Goal: Knowledge of General Education information will improve Description: Including pain rating scale, medication(s)/side effects and non-pharmacologic comfort measures Outcome: Adequate for Discharge   Problem: Health Behavior/Discharge Planning: Goal: Ability to manage health-related needs will improve Outcome: Adequate for Discharge   Problem: Clinical Measurements: Goal: Ability to maintain clinical measurements within normal limits will improve Outcome: Adequate for Discharge Goal: Will remain free from  infection Outcome: Adequate for Discharge Goal: Diagnostic test results will improve Outcome: Adequate for Discharge Goal: Respiratory complications will improve Outcome: Adequate for Discharge Goal: Cardiovascular complication will be avoided Outcome: Adequate for Discharge   Problem: Activity: Goal: Risk for activity intolerance will decrease Outcome: Adequate for Discharge   Problem: Nutrition: Goal: Adequate nutrition will be maintained Outcome: Adequate for Discharge   Problem: Elimination: Goal: Will not experience complications related to bowel motility Outcome: Adequate for Discharge Goal: Will not experience complications related to urinary retention Outcome: Adequate for Discharge   Problem: Coping: Goal: Level of anxiety will decrease Outcome: Adequate for Discharge   Problem: Pain Managment: Goal: General experience of comfort will improve Outcome: Adequate for Discharge   Problem: Safety: Goal: Ability to remain free from injury will improve Outcome: Adequate for Discharge   Problem: Skin Integrity: Goal: Risk for impaired skin integrity will decrease Outcome: Adequate for Discharge   Problem: Education: Goal: Ability to demonstrate management of disease process will improve Outcome: Adequate for Discharge Goal: Ability to verbalize understanding of medication therapies will improve Outcome: Adequate for Discharge Goal: Individualized Educational Video(s) Outcome: Adequate for Discharge   Problem: Activity: Goal: Capacity to carry out activities will improve Outcome: Adequate for Discharge   Problem: Cardiac: Goal: Ability to achieve and maintain adequate cardiopulmonary perfusion will improve Outcome: Adequate for Discharge   Problem: Education: Goal: Ability to describe self-care measures that may prevent or decrease complications (Diabetes Survival Skills Education) will improve Outcome: Adequate for Discharge Goal: Individualized Educational  Video(s) Outcome: Adequate for Discharge   Problem: Coping: Goal: Ability to adjust to condition or change in health will improve Outcome: Adequate for Discharge   Problem: Fluid Volume: Goal: Ability to maintain a balanced intake and output will improve Outcome: Adequate for Discharge   Problem: Health Behavior/Discharge Planning: Goal: Ability to identify and utilize available resources and services will improve Outcome: Adequate for Discharge Goal: Ability to manage health-related needs will improve Outcome: Adequate for Discharge   Problem: Nutritional: Goal: Maintenance of adequate nutrition will  improve Outcome: Adequate for Discharge Goal: Progress toward achieving an optimal weight will improve Outcome: Adequate for Discharge   Problem: Metabolic: Goal: Ability to maintain appropriate glucose levels will improve Outcome: Adequate for Discharge   Problem: Skin Integrity: Goal: Risk for impaired skin integrity will decrease Outcome: Adequate for Discharge   Problem: Nutritional: Goal: Maintenance of adequate nutrition will improve Outcome: Adequate for Discharge Goal: Progress toward achieving an optimal weight will improve Outcome: Adequate for Discharge   Problem: Tissue Perfusion: Goal: Adequacy of tissue perfusion will improve Outcome: Adequate for Discharge

## 2022-08-13 NOTE — Discharge Summary (Signed)
Physician Discharge Summary  Lucas Caldwell:[id number] DOB: Apr 18, 1933 DOA: 08/09/2022  PCP: Lucas Cords, DO  Admit date: 08/09/2022 Discharge date: 08/13/2022  Time spent: 35 minutes  Recommendations for Outpatient Follow-up:  Home with hospice for comfort focused care   Discharge Diagnoses:  Principal Problem:   Acute on chronic systolic (congestive) heart failure (HCC) Low output heart failure Multivessel CAD History of CABG Dementia Agitation and delirium AKI on CKD 3b Type 2 diabetes mellitus DNR  Discharge Condition: Stable but guarded prognosis  Diet recommendation: Low-sodium  Filed Weights   08/10/22 0425 08/11/22 0500 08/13/22 0339  Weight: 85.3 kg 83.2 kg 81.4 kg    History of present illness:  Lucas Caldwell with history of CAD, chronic systolic CHF, mixed cardiomyopathy, paroxysmal A-fib on Coumadin, CKD 3b, chronic myelocytic leukemia, hypertension and ongoing cognitive decline, early dementia was admitted 5/24 per CHF team, for persistent volume overload despite escalating doses of diuretics, admitted, underwent right and left heart cath noted to have elevated filling pressures, low cardiac index, high-grade distal left main disease, not candidate for CABG -5/25, patient developed more confusion, some agitation, worsened today, with hallucination, more agitated pulling monitors, IV etc. no fevers or chills, vitals relatively stable  Hospital Course:   Agitation, confusion -Suspect delirium in the background of dementia, ongoing memory decline and intermittent confusion especially in the evenings according to spouse for last 6 months -Was more agitated 5/25 and 5/26, more stable since yesterday -CBC, UA, hepatic function panel, ammonia level all unremarkable -Low-dose Seroquel added yesterday  -Had palliative care meeting 5/26, plan for discharge home with hospice today   Acute on chronic systolic CHF Mixed cardiomyopathy -Admitted with refractory  volume overload, complicated by low output heart failure on right heart cath -Was started on Lasix, milrinone along with Jardiance and Aldactone -Per CHF team concern for end-stage cardiomyopathy, not a candidate for advanced therapies or CABG with advanced age, memory loss and CKD -Palliative care meeting held 5/26, decision made for home with hospice services   CAD -Cath 5/24 noted severe CAD with high-grade distal LM stenosis, not a candidate for CABG   AKI on CKD3b -Mild uptrend in creatinine, on milrinone, monitor -Baseline 1.7-2   Type 2 diabetes mellitus -CBGs are stable   CODE STATUS: Changed to DNR Discussed with wife and daughter at bedside 5/26  Discharge Exam: Vitals:   08/13/22 0718 08/13/22 0738  BP: 104/72 116/76  Pulse: (!) 111 (!) 111  Resp: 20 (!) 26  Temp:  98.6 F (37 C)  SpO2: 92% 96%   Gen: Awake, Alert, Oriented X 2, cognitive deficits noted Lungs: Clear anteriorly CVS: S1S2/RRR Abd: soft, Non tender, non distended, BS present Extremities: No edema Skin: no new rashes on exposed skin   Discharge Instructions   Discharge Instructions     Diet general   Complete by: As directed    Increase activity slowly   Complete by: As directed       Allergies as of 08/13/2022       Reactions   Clopidogrel Anaphylaxis, Other (See Comments)   altered mental status;  (electrolytes "out of wack" and confusion per family; THEY DO NOT REMEMBER ANY OTHER REACTION)- MSB 10/10/15   Ciprofloxacin Itching   Beta Adrenergic Blockers    Nightmares   Mirtazapine Diarrhea   Bad dreams   Spironolactone Other (See Comments)   gynecomastia        Medication List     STOP taking these medications  aspirin 81 MG chewable tablet   [name] KwikPen 100 UNIT/ML   bosutinib 100 MG tablet Commonly known as: BOSULIF   Furoscix 80 MG/10ML Ctkt Generic drug: Furosemide   gabapentin 600 MG tablet Commonly known as: NEURONTIN   Gemtesa 75 MG Tabs Generic  drug: Vibegron   Insulin Pen Needle 31G X 5 MM Misc   isosorbide mononitrate 60 MG 24 hr tablet Commonly known as: IMDUR   Jardiance 10 MG Tabs tablet Generic drug: empagliflozin   losartan 25 MG tablet Commonly known as: COZAAR   multivitamin with minerals Tabs tablet   ONE TOUCH ULTRA 2 w/Device Kit   OneTouch Delica Plus Lancet30G Misc   OneTouch Verio test strip Generic drug: glucose blood   OT ULTRA/FASTTK CNTRL SOLN Soln   potassium chloride SA 20 MEQ tablet Commonly known as: KLOR-CON M   rosuvastatin 5 MG tablet Commonly known as: CRESTOR   spironolactone 25 MG tablet Commonly known as: ALDACTONE   warfarin 3 MG tablet Commonly known as: COUMADIN       TAKE these medications    acetaminophen 500 MG tablet Commonly known as: TYLENOL Take 1,000 mg by mouth 2 (two) times daily as needed for moderate pain or headache.   allopurinol 100 MG tablet Commonly known as: ZYLOPRIM TAKE 1 TABLET(100 MG) BY MOUTH DAILY What changed: See the new instructions.   B-D SINGLE USE SWABS REGULAR Pads Use to check blood sugar up to 2 times daily   GOLD BOND EX Apply 1 application topically daily as needed (muscle pain).   levothyroxine 25 MCG tablet Commonly known as: SYNTHROID TAKE 1 TABLET BY MOUTH EVERY MORNING BEFORE BREAKFAST   nitroGLYCERIN 0.4 MG SL tablet Commonly known as: NITROSTAT For chest pain, tightness, or pressure. While sitting, place 1 tablet under tongue. May be used every 5 minutes as needed, for up to 15 minutes. Do not use more than 3 tablets.   polyethylene glycol 17 g packet Commonly known as: MIRALAX / GLYCOLAX Take 17 g by mouth daily as needed for mild constipation.   QUEtiapine 25 MG tablet Commonly known as: SEROQUEL Take 1 tablet (25 mg total) by mouth at bedtime.   ranolazine 500 MG 12 hr tablet Commonly known as: RANEXA Take 1 tablet (500 mg total) by mouth 2 (two) times daily.   SYSTANE OP Place 1 drop into both eyes 3  (three) times daily as needed (dry/irritated eyes.).   tamsulosin 0.4 MG Caps capsule Commonly known as: FLOMAX Take 1 capsule (0.4 mg total) by mouth daily after supper.   torsemide 20 MG tablet Commonly known as: DEMADEX Take 5 tablets (100 mg total) by mouth 2 (two) times daily.   traMADol 50 MG tablet Commonly known as: ULTRAM Take 1-2 tablets (50-100 mg total) by mouth at bedtime as needed. What changed: reasons to take this       Allergies  Allergen Reactions   Clopidogrel Anaphylaxis and Other (See Comments)    altered mental status;  (electrolytes "out of wack" and confusion per family; THEY DO NOT REMEMBER ANY OTHER REACTION)- MSB 10/10/15   Ciprofloxacin Itching   Beta Adrenergic Blockers     Nightmares   Mirtazapine Diarrhea    Bad dreams    Spironolactone Other (See Comments)    gynecomastia    Follow-up Information     [facility] Follow up.   Specialty: Hospice and Palliative Medicine Why: Home Hospice RN- will call to arrange admission date and arrange appt Contact information: [address] [place] [address] [phone]                 The results of significant diagnostics from this hospitalization (including imaging, microbiology, ancillary and laboratory) are listed below for reference.    Significant Diagnostic Studies: [place] EKG SITE RITE  Result Date: 08/10/2022 If Site Rite image not attached, placement could not be confirmed due to current cardiac rhythm.  CARDIAC CATHETERIZATION  Result Date: 08/09/2022   Mid LAD-2 lesion is 10% stenosed.   Mid RCA lesion is 40% stenosed.   Dist RCA lesion is 40% stenosed.   1st Diag lesion is 70% stenosed.   1st Mrg lesion is 30% stenosed.   RPDA lesion is 30% stenosed.   Ost Cx to Prox Cx lesion is 90% stenosed.   Mid LM to Prox LAD lesion is 80% stenosed.   Prox LAD lesion is 40% stenosed.   Mid LAD-1 lesion is 50% stenosed.   Prox RCA lesion is 30% stenosed.   Non-stenotic  Mid Cx to Dist Cx lesion was previously treated.   Non-stenotic Prox RCA to Mid RCA lesion was previously treated. Findings: Ao = 101/58 (76) LV = 107/28 RA = 14 RV = 59/16 PA = 59/29 (39) PCW = 34 Fick cardiac output/index = 4.1/2.1 PVR = 1.2 WU Ao sat = 97% PA sat = 60%, 61% Assessment: 1. Severe CAD with high-grade severely calcified distal LM stenosis bifurcating into ostial LAD and LCX 2. EF 25% by echo 3. Elevated filling pressures with low cardiac output Plan/Discussion: I suspect he has end-stage heart disease with low output HF and elevated filling pressures not responding to aggressive medical therapy. [name] not CABG candidate. CAD reviewed with interventional team - PCI would be prohibitive risk. Will admit for HF tune-up with milrinone and IV diuresis. Arvilla Meres, MD 12:59 PM  DG Chest 2 View  Result Date: 07/23/2022 CLINICAL DATA:  Shortness of breath. EXAM: CHEST - 2 VIEW COMPARISON:  July 08, 2022. FINDINGS: Stable cardiomediastinal silhouette with mild central pulmonary vascular congestion. Minimal bibasilar densities are noted concerning for pulmonary edema. Bony thorax is unremarkable. IMPRESSION: Mild central pulmonary vascular congestion is noted with minimal bibasilar pulmonary edema. Electronically Signed   By: Lupita Raider M.D.   On: 07/23/2022 12:46    Microbiology: Recent Results (from the past 240 hour(s))  MRSA Next Gen by PCR, Nasal     Status: None   Collection Time: 08/09/22  2:28 PM   Specimen: Nasal Mucosa; Nasal Swab  Result Value Ref Range Status   MRSA by PCR Next Gen NOT DETECTED NOT DETECTED Final    Comment: (NOTE) The GeneXpert MRSA Assay (FDA approved for NASAL specimens only), is one component of a comprehensive MRSA colonization surveillance program. It is not intended to diagnose MRSA infection nor to guide or monitor treatment for MRSA infections. Test performance is not FDA approved in patients less than 102 years old. Performed at [facility], [address] [place], [place] [address]      Labs: Basic Metabolic Panel: Recent Labs  Lab 08/07/22 1203 08/09/22 1214 08/09/22 1221 08/10/22 0024 08/12/22 0027 08/13/22 0011  NA 132* 136 136  137 132* 135 134*  K 3.6 4.0 4.1  3.9 4.4 3.7 3.6  CL 97*  --   --  98 100 98  CO2 24  --   --  23 23 22   GLUCOSE 180*  --   --  114*  142* 136*  BUN 32*  --   --  32* 29* 27*  CREATININE 1.87*  --   --  2.13* 2.02* 1.84*  CALCIUM 8.8*  --   --  8.7* 9.2 9.2  MG  --   --   --  2.6* 2.3 2.4   Liver Function Tests: Recent Labs  Lab 08/11/22 1141  AST 29  ALT 23  ALKPHOS 72  BILITOT 1.1  PROT 8.2*  ALBUMIN 3.7   No results for input(s): "LIPASE", "AMYLASE" in the last 168 hours. Recent Labs  Lab 08/11/22 1141  AMMONIA 36*   CBC: Recent Labs  Lab 08/09/22 1214 08/09/22 1221 08/11/22 1127  WBC  --   --  9.6  HGB 12.6* 13.3  12.6* 13.2  HCT 37.0* 39.0  37.0* 39.7  MCV  --   --  84.8  PLT  --   --  197   Cardiac Enzymes: No results for input(s): "CKTOTAL", "CKMB", "CKMBINDEX", "TROPONINI" in the last 168 hours. BNP: BNP (last 3 results) Recent Labs    07/23/22 1041 08/02/22 1315 08/07/22 1203  BNP 656.2* 583.6* 710.9*    ProBNP (last 3 results) No results for input(s): "PROBNP" in the last 8760 hours.  CBG: Recent Labs  Lab 08/12/22 0306 08/12/22 1105 08/12/22 1622 08/12/22 2107 08/13/22 0607  GLUCAP 119* 192* 153* 170* 132*       Signed:  Zannie Cove MD.  [facility] 08/13/2022, 9:39 AM

## 2022-08-13 NOTE — Telephone Encounter (Signed)
Copied from CRM [id number]. Topic: General - Other >> Aug 13, 2022  2:13 PM Everette C wrote: Reason for CRM: [facility] with [employer], [employer] has called to request that Dr. Kirtland Bouchard be the patient's attending provider while in hospice care under [facility] would like additional confirmation of the patient's diagnosis and life expectancy   Please fax additional information to [phone] [facility]   Please contact further if needed

## 2022-08-14 NOTE — Telephone Encounter (Signed)
Spoke with Lucas Caldwell with [facility] and advised Dr Kirtland Bouchard would be attending.

## 2022-08-16 DIAGNOSIS — I110 Hypertensive heart disease with heart failure: Secondary | ICD-10-CM | POA: Diagnosis not present

## 2022-08-16 DIAGNOSIS — I5040 Unspecified combined systolic (congestive) and diastolic (congestive) heart failure: Secondary | ICD-10-CM | POA: Diagnosis not present

## 2022-08-16 DIAGNOSIS — E1159 Type 2 diabetes mellitus with other circulatory complications: Secondary | ICD-10-CM

## 2022-08-16 DIAGNOSIS — Z794 Long term (current) use of insulin: Secondary | ICD-10-CM | POA: Diagnosis not present

## 2022-08-19 ENCOUNTER — Ambulatory Visit: Payer: Medicare HMO | Admitting: Pharmacist

## 2022-08-19 ENCOUNTER — Telehealth (HOSPITAL_COMMUNITY): Payer: Self-pay | Admitting: Cardiology

## 2022-08-19 DIAGNOSIS — I5022 Chronic systolic (congestive) heart failure: Secondary | ICD-10-CM

## 2022-08-19 NOTE — Chronic Care Management (AMB) (Signed)
Chronic Care Management CCM Pharmacy Note  08/19/2022 Name:  Lucas Caldwell MRN:  [id number] DOB:  01/31/1934   Subjective: Lucas Caldwell is an 89 y.o. year old male who is a primary patient of Smitty Cords, DO.  The CCM team was consulted for assistance with disease management and care coordination needs.    Engaged with patient's wife by telephone for follow up visit for pharmacy case management and/or care coordination services.   Note patient admitted to [facility] 5/24. Discharged 08/13/2022 with hospice [facility]) for comfort focused care  Objective:  Medications Reviewed Today     Reviewed by Lucas Caldwell, RPH-CPP (Pharmacist) on 08/19/22 at 1808  Med List Status: <None>   Medication Order Taking? Sig Documenting Provider Last Dose Status Informant  acetaminophen (TYLENOL) 500 MG tablet [id number] Yes Take 1,000 mg by mouth 2 (two) times daily as needed for moderate pain or headache. [provider] Taking Active Spouse/Significant Other  Alcohol Swabs (B-D SINGLE USE SWABS REGULAR) PADS [id number]  Use to check blood sugar up to 2 times daily Smitty Cords, DO  Active Spouse/Significant Other  allopurinol (ZYLOPRIM) 100 MG tablet [id number] Yes TAKE 1 TABLET(100 MG) BY MOUTH DAILY  Patient taking differently: Take 100 mg by mouth every evening.   Creig Hines, NP Taking Active Spouse/Significant Other  aspirin 81 MG chewable tablet [id number] Yes Chew 81 mg by mouth in the morning. [provider] Taking Active Spouse/Significant Other  gabapentin (NEURONTIN) 600 MG tablet [id number] Yes Take 600 mg by mouth 2 (two) times daily. [provider] Taking Active   Insulin Glargine [facility] KWIKPEN) 100 UNIT/ML [id number] Yes Inject 22 Units into the skin daily.  Patient taking differently: Inject 22 Units into the skin at bedtime.   Smitty Cords, DO Taking Active Spouse/Significant  Other  levothyroxine (SYNTHROID) 25 MCG tablet [id number] Yes TAKE 1 TABLET BY MOUTH EVERY MORNING BEFORE BREAKFAST [name] [name], DO Taking Active Spouse/Significant Other  Menthol-Zinc Oxide (GOLD BOND EX) [id number]  Apply 1 application topically daily as needed (muscle pain). [provider]  Active Spouse/Significant Other  nitroGLYCERIN (NITROSTAT) 0.4 MG SL tablet [id number]  For chest pain, tightness, or pressure. While sitting, place 1 tablet under tongue. May be used every 5 minutes as needed, for up to 15 minutes. Do not use more than 3 tablets. Yvonne Kendall, MD  Active Spouse/Significant Other  Polyethyl Glycol-Propyl Glycol (SYSTANE OP) [id number]  Place 1 drop into both eyes 3 (three) times daily as needed (dry/irritated eyes.).  [provider]  Active Spouse/Significant Other  polyethylene glycol (MIRALAX / GLYCOLAX) 17 g packet [id number] Yes Take 17 g by mouth daily as needed for mild constipation.  [provider] Taking Active Spouse/Significant Other  QUEtiapine (SEROQUEL) 25 MG tablet [id number] Yes Take 1 tablet (25 mg total) by mouth at bedtime. Zannie Cove, MD Taking Active   ranolazine (RANEXA) 500 MG 12 hr tablet [id number] Yes Take 1 tablet (500 mg total) by mouth 2 (two) times daily. End, [name], MD Taking Active Spouse/Significant Other  rosuvastatin (CRESTOR) 5 MG tablet [id number] Yes TAKE 1 TABLET(5 MG) BY MOUTH DAILY  Patient taking differently: Take 5 mg by mouth at bedtime.   End, [name], MD Taking Active Spouse/Significant Other  tamsulosin (FLOMAX) 0.4 MG CAPS capsule [id number] Yes Take 1 capsule (0.4 mg total) by mouth daily after supper. Sondra Barges, PA-C Taking Active Spouse/Significant Other  torsemide (DEMADEX) 20 MG tablet [id number] Yes  Take 4 tablets (80 mg) in the morning and take 2 tablets (40 mg) in the evening. Zannie Cove, MD Taking Active   traMADol Janean Sark) 50 MG tablet [id number] Yes Take 1-2 tablets  (50-100 mg total) by mouth at bedtime as needed.  Patient taking differently: Take 50-100 mg by mouth at bedtime as needed (pain.).   Smitty Cords, DO Taking Active Spouse/Significant Other            Pertinent Labs:   Lab Results  Component Value Date   CREATININE 1.84 (H) 08/13/2022   BUN 27 (H) 08/13/2022   NA 134 (L) 08/13/2022   K 3.6 08/13/2022   CL 98 08/13/2022   CO2 22 08/13/2022    SDOH:  (Social Determinants of Health) assessments and interventions performed:  SDOH Interventions    Flowsheet Row Chronic Care Management from 06/10/2022 in [facility] Chronic Care Management from 01/28/2022 in [facility] Care Coordination from 11/05/2021 in [facility] Community Care Coordination Office Visit from 10/05/2021 in [facility] Clinical Support from 09/03/2021 in [facility] Chronic Care Management from 10/02/2020 in [facility]  SDOH Interventions        Food Insecurity Interventions Intervention Not Indicated -- Intervention Not Indicated -- Intervention Not Indicated --  Housing Interventions Intervention Not Indicated -- Intervention Not Indicated -- Intervention Not Indicated --  Transportation Interventions Intervention Not Indicated -- Intervention Not Indicated -- Intervention Not Indicated --  Utilities Interventions Intervention Not Indicated Intervention Not Indicated -- -- -- --  Alcohol Usage Interventions Intervention Not Indicated (Score <7) -- -- -- -- --  Depression Interventions/Treatment  -- -- -- Counseling -- --  Financial Strain Interventions Intervention Not Indicated -- Intervention Not Indicated -- Intervention Not Indicated --  Physical Activity Interventions Other (Comments)  [encouraged activity, gets easily shob with activity] -- Other (Comments)  [gets really short of breath due to chronic  conditions] -- -- Other (Comments)  [gets easily short of breath. Limited to the activity he can do]  Stress Interventions Intervention Not Indicated -- -- -- Intervention Not Indicated --  Social Connections Interventions Intervention Not Indicated -- -- -- Intervention Not Indicated --       CCM Care Plan  Review of patient past medical history, allergies, medications, health status, including review of consultants reports, laboratory and other test data, was performed as part of comprehensive evaluation and provision of chronic care management services.   Care Plan : PharmD - Medication Management/Assistance  Updates made by Lucas Caldwell, RPH-CPP since 08/19/2022 12:00 AM     Problem: Disease Progression      Long-Range Goal: Disease Progression Prevented or Minimized   Start Date: 04/14/2020  Expected End Date: 07/13/2020  Recent Progress: On track  Priority: High  Note:   Current Barriers:  Chronic Disease Management support, education, and care coordination needs related to type 2 diabetes, CKD, HFrEF s/p CardioMEMS, hyperlipidemia, atrial fibrillation, hypertension, coronary artery disease, CHF, hypothyroidism, and CML Financial Note patient has Partial Extra Help/LIS Subsidy Patient enrolled in [place] patient assistance program for 2024 calendar year Patient denied for patient assistance for [place] from [facility] for 2022 calendar year Patient previously enrolled in [employer] [facility] Heart Failure fund for [employer] copayment assistance, but eligibility ended as not in use ($0 copayment through Part D plan coverage for Jardiance) Limited vision  Pharmacist Clinical Goal(s):  Over the  next 90 days, patient will verbalize ability to afford treatment regimen through collaboration with PharmD and provider.   Interventions: 1:1 collaboration with Smitty Cords, DO regarding development and update of comprehensive plan of care as evidenced by provider  attestation and co-signature Inter-disciplinary care team collaboration (see longitudinal plan of care) Perform chart review. Patient admitted to [facility] 5/24. Discharged 08/13/2022 with hospice [facility]) for comfort focused care. Discharging provider advised patient to: START: QUEtiapine 25 MG at bedtime CHANGE: torsemide 20 MG tablet - Take 4 tablets (80 mg) in the morning and take 2 tablets (40 mg) in the evening. STOP: bosutinib 100 MG tablet (BOSULIF) Furoscix 80 MG/10ML Ctkt (Furosemide) gabapentin 600 MG tablet (NEURONTIN) Gemtesa 75 MG Tabs (Vibegron) Insulin Pen Needle 31G X 5 MM Misc isosorbide mononitrate 60 MG 24 hr tablet (IMDUR) Jardiance 10 MG Tabs tablet (empagliflozin) losartan 25 MG tablet (COZAAR) multivitamin with minerals Tabs tablet ONE TOUCH ULTRA 2 w/Device Kit OneTouch Delica Plus Lancet30G Misc OneTouch Verio test strip (glucose blood) OT ULTRA/FASTTK CNTRL SOLN Soln potassium chloride SA 20 MEQ tablet (KLOR-CON M) spironolactone 25 MG tablet (ALDACTONE) warfarin 3 MG tablet (COUMADIN) Today follow up with patient/spouse as scheduled. [name] confirms patient now being seen by [facility] Reports patient's sleep has improved with start of quetiapine Reports have continued to have patient take several medications that were marked to be stopped at discharge on 5/28, including gabapentin, isosorbide, Jardiance, losartan and warfarin and prefers to continue patient on these medications Outreach to PCP regarding gabapentin. PCP confirms okay for patient to continue gabapentin for now if providing symptom management Outreach to [facility]. Leave message with nurse triage line requesting call back to patient/wife to address their questions regarding wanting to continue patient's prescriptions for isosorbide, Jardiance, losartan and warfarin [name] states that she and patient's daughter will also place  call to [facility] today  Patient Goals/Self-Care Activities Over the next 90 days, patient will:  Check blood sugar, blood pressure and fluid status as directed by providers  Take medications, with assistance of wife, as directed Mrs. Cogan manages patient's medications using weekly pillbox as adherence tool Attends scheduled medical appointments        Plan:   [name] denies further medication questions or concerns today on behalf of patient Provide patient's wife with contact information for clinic pharmacist to contact if needed in future for medication questions/concerns   Estelle Grumbles, PharmD, Patsy [place], CPP Clinical Pharmacist [facility] [phone]

## 2022-08-19 NOTE — Patient Instructions (Signed)
Visit Information  Thank you for taking time to visit with me today. Please don't hesitate to contact me if I can be of assistance to you before our next scheduled telephone appointment.  Following are the goals we discussed today:   Goals Addressed             This Visit's Progress    Pharmacy Goals       Feel free to call me with any medication questions or concerns.  Estelle Grumbles, PharmD, Patsy Baltimore, CPP Clinical Pharmacist [facility] [phone]          Please call the care guide team at [phone] if you need to cancel or reschedule your appointment.    The patient verbalized understanding of instructions, educational materials, and care plan provided today and DECLINED offer to receive copy of patient instructions, educational materials, and care plan.

## 2022-08-19 NOTE — Telephone Encounter (Signed)
Patients medications were changed at discharge Pt discharged with palliative/hospice  Wife/daughter would like to know if its ok to continue Warfarin,jardiance,imdur and losartan  Advised above medications were identified as non essential and should be stopped however family would like to continue for the sake of routine   Family seen by home palliative 6/2  Please advise

## 2022-08-20 ENCOUNTER — Ambulatory Visit: Payer: Medicare HMO | Admitting: Physician Assistant

## 2022-08-20 NOTE — Telephone Encounter (Signed)
Pt aware via wife 

## 2022-08-21 ENCOUNTER — Ambulatory Visit: Payer: Medicare HMO | Attending: Internal Medicine

## 2022-08-21 ENCOUNTER — Encounter: Payer: Self-pay | Admitting: Student in an Organized Health Care Education/Training Program

## 2022-08-21 ENCOUNTER — Telehealth: Payer: Self-pay

## 2022-08-21 ENCOUNTER — Telehealth: Payer: Medicare HMO

## 2022-08-21 ENCOUNTER — Ambulatory Visit: Payer: Medicare HMO | Admitting: Student in an Organized Health Care Education/Training Program

## 2022-08-21 ENCOUNTER — Encounter: Payer: Self-pay | Admitting: Internal Medicine

## 2022-08-21 ENCOUNTER — Ambulatory Visit: Payer: Medicare HMO | Attending: Internal Medicine | Admitting: Internal Medicine

## 2022-08-21 VITALS — BP 112/72 | HR 96 | Ht 63.0 in | Wt 188.2 lb

## 2022-08-21 VITALS — BP 120/64 | HR 88 | Temp 97.9°F | Ht 65.0 in | Wt 198.0 lb

## 2022-08-21 DIAGNOSIS — I480 Paroxysmal atrial fibrillation: Secondary | ICD-10-CM

## 2022-08-21 DIAGNOSIS — N1832 Chronic kidney disease, stage 3b: Secondary | ICD-10-CM | POA: Diagnosis not present

## 2022-08-21 DIAGNOSIS — I5022 Chronic systolic (congestive) heart failure: Secondary | ICD-10-CM | POA: Diagnosis not present

## 2022-08-21 DIAGNOSIS — Z7901 Long term (current) use of anticoagulants: Secondary | ICD-10-CM | POA: Diagnosis not present

## 2022-08-21 DIAGNOSIS — I255 Ischemic cardiomyopathy: Secondary | ICD-10-CM | POA: Diagnosis not present

## 2022-08-21 DIAGNOSIS — I25118 Atherosclerotic heart disease of native coronary artery with other forms of angina pectoris: Secondary | ICD-10-CM | POA: Diagnosis not present

## 2022-08-21 LAB — POCT INR: INR: 2 (ref 2.0–3.0)

## 2022-08-21 MED ORDER — TRELEGY ELLIPTA 100-62.5-25 MCG/ACT IN AEPB: 1.0000 | INHALATION_SPRAY | Freq: Every day | RESPIRATORY_TRACT | 0 refills | Status: DC

## 2022-08-21 NOTE — Progress Notes (Signed)
Assessment & Plan:   #Advanced HFrEF #Shortness of breath  Patient is presenting for evaluation of shortness of breath in the setting of known cardiomyopathy, advanced heart failure, and CAD. His symptoms are mostly driven by his heart failure given recent PAOP of 34 mmHg. [name] unfortunately not a candidate for advanced therapies given his age, CKD, and dementia, and was recommended hospice care with focus on comfort.  It is not my impression that the patient's symptoms are driven by a pulmonary process, especially given the very distant history of smoking and his advanced heart disease. I have recommended that he engage with hospice as well as with his cardiology team. I will trial him on a long acting inhaler (LABA/LAMA/ICS) and asked that he call our office should he feel better with it. Should that be the case, I will send a standing prescription for the inhaler. I see no role for pulmonary function testing nor chest imaging at this moment. I have also communicated with [name] multiple providers to facilitate care today.  -Fluticasone-Umeclidin-Vilant (TRELEGY ELLIPTA) 100-62.5-25 MCG/ACT AEPB one puff once daily.  Return if symptoms worsen or fail to improve.  I spent 60 minutes caring for this patient today, including preparing to see the patient, obtaining a medical history , reviewing a separately obtained history, performing a medically appropriate examination and/or evaluation, counseling and educating the patient/family/caregiver, ordering medications, tests, or procedures, referring and communicating with other health care professionals (not separately reported), documenting clinical information in the electronic health record, independently interpreting results (not separately reported/billed) and communicating results to the patient/family/caregiver, and care coordination (not separately reported/billed)  Raechel Chute, MD [facility] 08/21/2022 3:22 PM     End of visit medications:  Meds ordered this encounter  Medications   Fluticasone-Umeclidin-Vilant (TRELEGY ELLIPTA) 100-62.5-25 MCG/ACT AEPB    Sig: Inhale 1 puff into the lungs daily.    Dispense:  28 each    Refill:  0    Order Specific Question:   Lot Number?    Answer:   [id number]    Order Specific Question:   Expiration Date?    Answer:   12/17/2023    Order Specific Question:   Quantity    Answer:   2     Current Outpatient Medications:    acetaminophen (TYLENOL) 500 MG tablet, Take 1,000 mg by mouth 2 (two) times daily as needed for moderate pain or headache., Disp: , Rfl:    Alcohol Swabs (B-D SINGLE USE SWABS REGULAR) PADS, Use to check blood sugar up to 2 times daily, Disp: 200 each, Rfl: 3   allopurinol (ZYLOPRIM) 100 MG tablet, TAKE 1 TABLET(100 MG) BY MOUTH DAILY (Patient taking differently: Take 100 mg by mouth every evening.), Disp: 90 tablet, Rfl: 0   aspirin 81 MG chewable tablet, Chew 81 mg by mouth in the morning., Disp: , Rfl:    Fluticasone-Umeclidin-Vilant (TRELEGY ELLIPTA) 100-62.5-25 MCG/ACT AEPB, Inhale 1 puff into the lungs daily., Disp: 28 each, Rfl: 0   gabapentin (NEURONTIN) 600 MG tablet, Take 600 mg by mouth 2 (two) times daily., Disp: , Rfl:    Insulin Glargine ([name] KWIKPEN) 100 UNIT/ML, Inject 22 Units into the skin daily. (Patient taking differently: Inject 22 Units into the skin at bedtime.), Disp: 6 mL, Rfl: 0   levothyroxine (SYNTHROID) 25 MCG tablet, TAKE 1 TABLET BY MOUTH EVERY MORNING BEFORE BREAKFAST, Disp: 90 tablet, Rfl: 2   Menthol-Zinc Oxide (GOLD BOND EX), Apply 1 application topically daily  as needed (muscle pain)., Disp: , Rfl:    nitroGLYCERIN (NITROSTAT) 0.4 MG SL tablet, For chest pain, tightness, or pressure. While sitting, place 1 tablet under tongue. May be used every 5 minutes as needed, for up to 15 minutes. Do not use more than 3 tablets., Disp: 25 tablet, Rfl: 1   Polyethyl Glycol-Propyl Glycol (SYSTANE OP), Place 1 drop into  both eyes 3 (three) times daily as needed (dry/irritated eyes.). , Disp: , Rfl:    polyethylene glycol (MIRALAX / GLYCOLAX) 17 g packet, Take 17 g by mouth daily as needed for mild constipation. , Disp: , Rfl:    QUEtiapine (SEROQUEL) 25 MG tablet, Take 1 tablet (25 mg total) by mouth at bedtime., Disp: 30 tablet, Rfl: 0   ranolazine (RANEXA) 500 MG 12 hr tablet, Take 1 tablet (500 mg total) by mouth 2 (two) times daily., Disp: 180 tablet, Rfl: 2   rosuvastatin (CRESTOR) 5 MG tablet, TAKE 1 TABLET(5 MG) BY MOUTH DAILY (Patient taking differently: Take 5 mg by mouth at bedtime.), Disp: 90 tablet, Rfl: 0   tamsulosin (FLOMAX) 0.4 MG CAPS capsule, Take 1 capsule (0.4 mg total) by mouth daily after supper., Disp: 30 capsule, Rfl: 11   torsemide (DEMADEX) 20 MG tablet, Take 4 tablets (80 mg) in the morning and take 2 tablets (40 mg) in the evening., Disp: 180 tablet, Rfl: 0   traMADol (ULTRAM) 50 MG tablet, Take 1-2 tablets (50-100 mg total) by mouth at bedtime as needed. (Patient taking differently: Take 50-100 mg by mouth at bedtime as needed (pain.).), Disp: 30 tablet, Rfl: 2   Subjective:   PATIENT ID: Lucas Caldwell GENDER: male DOB: 1933-09-23, MRN: [id number]  Chief Complaint  Patient presents with   Follow-up    Recent admission- d/c with 2L. SOB with exertion and dry cough.     HPI  89 year old male with a past medical history of chronic heart failure presenting to clinic for the evaluation of shortness of breath.    Patient initially though he was presenting for a visit with his cardiology providers. He has a long standing history of heart failure for which he's been followed by his cardiologists (Primary: Dr. Okey Caldwell, Advanced Heart Failure: Dr. Gala Caldwell).   Patient has a CardioMEMS device to follow up diastolic PA pressure, and on multiple visit this was noted to be significantly elevated. His diuretic dose was increased (torsemide) without much improvement in his pressures nor in his  symptoms of shortness of breath. Following his last visit with cardiology, he was recommended a right and left heart cath, performed on 08/09/2022.  Left heart cath was noted for severe CAD with high grade severely calcified distal left main stenosis. Echo was notable for severe HFrEF at 25%. RHC showed RV 59/16, PA 59/29 (39) and PAOP of 34. FICK CO was 4.1, CI was 2.1, with PVR of 1.2 wood units. He was admitted to the hospital following the RHC for diuresis, and discharged on 08/13/2022. Patient and family were counseled on his advanced heart failure and poor prognosis, with no role for advanced heart failure therapy given advanced age, CKD, and dementia. Also notable that the patient developed severe delirium during his hospitalization.  He was discharged home with hospice. A referral to pulmonology was placed prior to RHC and hospitalization, and he is presenting today accompanied by his wife. He thought he was here to visit cardiology. Patient and his wife tell me they did not agree with hospice, and would like to continue  to get care.   Patient continues to have exertional dyspnea, and reports an occasional cough. No fevers, no chills, and no chest pain. Wife reports compliance with low salt diet. Patient has a very distant history of smoking (reports quitting in the 1970's).  Ancillary information including prior medications, full medical/surgical/family/social histories, and PFTs (when available) are listed below and have been reviewed.   Review of Systems  Constitutional:  Negative for chills, fever and weight loss.  Respiratory:  Positive for shortness of breath. Negative for wheezing.   Cardiovascular:  Negative for chest pain.     Objective:   Vitals:   08/21/22 1455  BP: 120/64  Pulse: 88  Temp: 97.9 F (36.6 C)  TempSrc: Temporal  SpO2: 97%  Weight: 198 lb (89.8 kg)  Height: 5\' 5"  (1.651 m)   97% on RA BMI Readings from Last 3 Encounters:  08/21/22 32.95 kg/m  08/13/22  29.86 kg/m  08/07/22 32.62 kg/m   Wt Readings from Last 3 Encounters:  08/21/22 198 lb (89.8 kg)  08/13/22 179 lb 7.3 oz (81.4 kg)  08/07/22 196 lb (88.9 kg)    Physical Exam Constitutional:      Appearance: [name] obese. [name] not ill-appearing.  HENT:     Mouth/Throat:     Mouth: Mucous membranes are moist.  Cardiovascular:     Rate and Rhythm: Normal rate and regular rhythm.     Heart sounds: Normal heart sounds.  Pulmonary:     Effort: Pulmonary effort is normal.     Breath sounds: Rales present.  Abdominal:     General: There is distension.     Palpations: Abdomen is soft.  Musculoskeletal:     Right lower leg: Edema present.     Left lower leg: Edema present.     Ancillary Information    Past Medical History:  Diagnosis Date   Arthritis    "probably in his legs" (08/15/2016)   Blind right eye    BPH (benign prostatic hyperplasia)    Breast asymmetry    Left breast is larger, present for several years.   CAD (coronary artery disease)    a. Cath in the late 90's - reportedly ok;  b. 2014 s/p stenting x 2 @ [facility]; c 08/19/16 Cath/PCI with DES -> RCA, plan to treat LM 70% medically. Seen by surgery and felt to be too high risk for CABG; d. 08/2018 Cath: LM 70 (iFR 0.86), LAD 20ost, 40p, 50/46m, D1 70, LCX patent stent, OM1 30, RCA patent stent, 60m ISR, 40d, RPDA 30. PCWP 8. CO/CI 4.0/2.1; e. 03/2019 PCI to LAD (2.5x15 Resolute Onyx DES).   Chronic combined systolic (congestive) and diastolic (congestive) heart failure (HCC)    a. Previously reduced EF-->50% by echo in 2012;  b. 06/2015 Echo: EF 50-55%  c. 07/2016 Echo: EF 45-50%; d. 12/2017 Echo: EF 55%; e. 08/2018 Echo: EF 35-40%; f. 04/2019 Echo: EF 30-35%; g. s/p cardiomems; h. 10/2020 Echo: EF 30-35%; i. 01/2022 Echo: EF 30-35%, glob HK, GrII DD, nl rV fxn, mild to mod MR.   Chronic kidney disease (CKD), stage IV (severe) (HCC)    CML (chronic myelocytic leukemia) (HCC)    GERD (gastroesophageal reflux disease)    GIB  (gastrointestinal bleeding)    a. 12/2017 3 unit PRBC GIB in setting of coumadin-->Endo/colon multiple duodenal ulcers and a single bleeding ulcer in the proximal ascending colon status post hemostatic clipping x2.   Gout    Hyperlipemia    Hypertension  Hypothyroidism    Iron deficiency anemia    Ischemic cardiomyopathy    a. Previously reduced EF-->50% by echo in 2012;  b. 06/2015 Echo: EF 50-55%, Gr2 DD;  c. 07/2016 Echo: EF 45-50%; d. 12/2017 Echo: EF 55%, Gr1 DD, mild MR; e. 08/2018 Echo: EF 35-40%; f. 04/2019 Echo: EF 30-35%; g. 10/2020 Echo: EF 30-35%; h. Echo: EF 30-35%   Migraine    "in the 1960s" (08/15/2016)   Obstructive sleep apnea    Orthostatic hypotension    a. 20222 -->carvedilol and hydralazine d/c'd.   PAF (paroxysmal atrial fibrillation) (HCC)    a. ? Dx 2014-->s/p DCCV;  b. CHA2DS2VASc = 6--> Coumadin.   Prostate cancer (HCC)    RBBB    Type II diabetes mellitus (HCC)      Family History  Problem Relation Age of Onset   Brain cancer Father    Diabetes Sister    Heart attack Sister      Past Surgical History:  Procedure Laterality Date   AMPUTATION TOE Right 02/23/2021   Procedure: AMPUTATION TOE;  Surgeon: Gwyneth Revels, DPM;  Location: [facility] ORS;  Service: Podiatry;  Laterality: Right;   CATARACT EXTRACTION W/ INTRAOCULAR LENS  IMPLANT, BILATERAL Bilateral    COLONOSCOPY N/A 01/13/2018   Procedure: COLONOSCOPY;  Surgeon: Toney Reil, MD;  Location: [facility] ENDOSCOPY;  Service: Gastroenterology;  Laterality: N/A;   COLONOSCOPY WITH PROPOFOL N/A 01/01/2018   Procedure: COLONOSCOPY WITH PROPOFOL;  Surgeon: Toney Reil, MD;  Location: [facility] ENDOSCOPY;  Service: Gastroenterology;  Laterality: N/A;   CORONARY ANGIOPLASTY WITH STENT PLACEMENT  09/2012   2 stents   CORONARY STENT INTERVENTION N/A 08/19/2016   Procedure: Coronary Stent Intervention;  Surgeon: Yvonne Kendall, MD;  Location: [facility];  Service: Cardiovascular;  Laterality: N/A;    CORONARY STENT INTERVENTION N/A 04/13/2019   Procedure: CORONARY STENT INTERVENTION;  Surgeon: Yvonne Kendall, MD;  Location: [facility] IN[facility];  Service: Cardiovascular;  Laterality: N/A;   ESOPHAGEAL MANOMETRY N/A 12/16/2017   Procedure: ESOPHAGEAL MANOMETRY (EM);  Surgeon: Toney Reil, MD;  Location: [facility] ENDOSCOPY;  Service: Gastroenterology;  Laterality: N/A;   ESOPHAGOGASTRODUODENOSCOPY N/A 12/20/2016   Procedure: ESOPHAGOGASTRODUODENOSCOPY (EGD);  Surgeon: Toney Reil, MD;  Location: [facility] ENDOSCOPY;  Service: Gastroenterology;  Laterality: N/A;   ESOPHAGOGASTRODUODENOSCOPY N/A 01/13/2018   Procedure: ESOPHAGOGASTRODUODENOSCOPY (EGD);  Surgeon: Toney Reil, MD;  Location: [facility] ENDOSCOPY;  Service: Gastroenterology;  Laterality: N/A;   ESOPHAGOGASTRODUODENOSCOPY (EGD) WITH PROPOFOL N/A 11/03/2017   Procedure: ESOPHAGOGASTRODUODENOSCOPY (EGD) WITH PROPOFOL;  Surgeon: Toney Reil, MD;  Location: [facility] ENDOSCOPY;  Service: Gastroenterology;  Laterality: N/A;   EYE SURGERY     HERNIA REPAIR     "navel"   LEFT HEART CATH AND CORONARY ANGIOGRAPHY N/A 08/14/2016   Procedure: Left Heart Cath and Coronary Angiography;  Surgeon: Yvonne Kendall, MD;  Location: [facility];  Service: Cardiovascular;  Laterality: N/A;   PRESSURE SENSOR/CARDIOMEMS N/A 09/23/2019   Procedure: PRESSURE SENSOR/CARDIOMEMS;  Surgeon: Dolores Patty, MD;  Location: [facility];  Service: Cardiovascular;  Laterality: N/A;   RIGHT HEART CATH N/A 09/23/2019   Procedure: RIGHT HEART CATH;  Surgeon: Dolores Patty, MD;  Location: [facility];  Service: Cardiovascular;  Laterality: N/A;   RIGHT/LEFT HEART CATH AND CORONARY ANGIOGRAPHY N/A 09/01/2018   Procedure: RIGHT/LEFT HEART CATH AND CORONARY ANGIOGRAPHY;  Surgeon: Yvonne Kendall, MD;  Location: [facility];  Service: Cardiovascular;  Laterality: N/A;   RIGHT/LEFT  HEART CATH AND CORONARY ANGIOGRAPHY N/A  04/13/2019   Procedure: RIGHT/LEFT HEART CATH AND CORONARY ANGIOGRAPHY;  Surgeon: Yvonne Kendall, MD;  Location: [facility];  Service: Cardiovascular;  Laterality: N/A;   RIGHT/LEFT HEART CATH AND CORONARY ANGIOGRAPHY N/A 08/09/2022   Procedure: RIGHT/LEFT HEART CATH AND CORONARY ANGIOGRAPHY;  Surgeon: Dolores Patty, MD;  Location: [facility];  Service: Cardiovascular;  Laterality: N/A;   TOE AMPUTATION Right    "big toe"    Social History   Socioeconomic History   Marital status: Married    Spouse name: Reece Zambo   Number of children: 2   Years of education: Not on file   Highest education level: 7th grade  Occupational History   Occupation: Retired    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  Tobacco Use   Smoking status: Former    Packs/day: 1.75    Years: 20.00    Additional pack years: 0.00    Total pack years: 35.00    Types: Cigarettes    Start date: 75    Quit date: 1975    Years since quitting: 49.4   Smokeless tobacco: Former  Building services engineer Use: Never used  Substance and Sexual Activity   Alcohol use: No    Comment: previously drank heavily - quit 1979.   Drug use: No   Sexual activity: Never  Other Topics Concern   Not on file  Social History Narrative   Working on transportation truck part time    Social Determinants of Health   Financial Resource Strain: Low Risk  (06/10/2022)   Overall Financial Resource Strain (CARDIA)    Difficulty of Paying Living Expenses: Not hard at all  Food Insecurity: No Food Insecurity (06/10/2022)   Hunger Vital Sign    Worried About Running Out of Food in the Last Year: Never true    Ran Out of Food in the Last Year: Never true  Transportation Needs: No Transportation Needs (06/10/2022)   PRAPARE - Administrator, Civil Service (Medical): No    Lack of Transportation (Non-Medical): No  Physical Activity: Inactive (06/10/2022)   Exercise Vital Sign    Days of Exercise per  Week: 0 days    Minutes of Exercise per Session: 0 min  Stress: No Stress Concern Present (06/10/2022)   [employer] of Occupational Health - Occupational Stress Questionnaire    Feeling of Stress : Not at all  Social Connections: Socially Integrated (06/10/2022)   Social Connection and Isolation Panel [NHANES]    Frequency of Communication with Friends and Family: More than three times a week    Frequency of Social Gatherings with Friends and Family: More than three times a week    Attends Religious Services: More than 4 times per year    Active Member of [employer] or Organizations: Yes    Attends Engineer, structural: More than 4 times per year    Marital Status: Married  Catering manager Violence: Not At Risk (06/10/2022)   Humiliation, Afraid, Rape, and Kick questionnaire    Fear of Current or Ex-Partner: No    Emotionally Abused: No    Physically Abused: No    Sexually Abused: No     Allergies  Allergen Reactions   Clopidogrel Anaphylaxis and Other (See Comments)    altered mental status;  (electrolytes "out of wack" and confusion per family; THEY DO NOT REMEMBER ANY OTHER REACTION)- MSB 10/10/15   Ciprofloxacin Itching  Beta Adrenergic Blockers     Nightmares   Mirtazapine Diarrhea    Bad dreams    Spironolactone Other (See Comments)    gynecomastia     CBC    Component Value Date/Time   WBC 9.6 08/11/2022 1127   RBC 4.68 08/11/2022 1127   HGB 13.2 08/11/2022 1127   HGB 13.2 06/29/2020 1411   HCT 39.7 08/11/2022 1127   HCT 39.4 06/29/2020 1411   PLT 197 08/11/2022 1127   PLT 182 06/29/2020 1411   MCV 84.8 08/11/2022 1127   MCV 84 06/29/2020 1411   MCV 92 05/18/2012 0440   MCH 28.2 08/11/2022 1127   MCHC 33.2 08/11/2022 1127   RDW 14.7 08/11/2022 1127   RDW 15.6 (H) 06/29/2020 1411   RDW 15.3 (H) 05/18/2012 0440   LYMPHSABS 1,723 12/05/2021 1537   LYMPHSABS 1.8 08/23/2019 1544   LYMPHSABS 2.4 03/16/2012 0932   MONOABS 1.0 02/21/2021 1127    MONOABS 1.6 (H) 03/16/2012 0932   EOSABS 156 12/05/2021 1537   EOSABS 0.2 08/23/2019 1544   EOSABS 0.1 03/16/2012 0932   BASOSABS 39 12/05/2021 1537   BASOSABS 0.1 08/23/2019 1544   BASOSABS 4 05/18/2012 0440   BASOSABS 0.0 03/16/2012 0932    Pulmonary Functions Testing Results:     No data to display          Outpatient Medications Prior to Visit  Medication Sig Dispense Refill   acetaminophen (TYLENOL) 500 MG tablet Take 1,000 mg by mouth 2 (two) times daily as needed for moderate pain or headache.     Alcohol Swabs (B-D SINGLE USE SWABS REGULAR) PADS Use to check blood sugar up to 2 times daily 200 each 3   allopurinol (ZYLOPRIM) 100 MG tablet TAKE 1 TABLET(100 MG) BY MOUTH DAILY (Patient taking differently: Take 100 mg by mouth every evening.) 90 tablet 0   aspirin 81 MG chewable tablet Chew 81 mg by mouth in the morning.     gabapentin (NEURONTIN) 600 MG tablet Take 600 mg by mouth 2 (two) times daily.     Insulin Glargine ([name] KWIKPEN) 100 UNIT/ML Inject 22 Units into the skin daily. (Patient taking differently: Inject 22 Units into the skin at bedtime.) 6 mL 0   levothyroxine (SYNTHROID) 25 MCG tablet TAKE 1 TABLET BY MOUTH EVERY MORNING BEFORE BREAKFAST 90 tablet 2   Menthol-Zinc Oxide (GOLD BOND EX) Apply 1 application topically daily as needed (muscle pain).     nitroGLYCERIN (NITROSTAT) 0.4 MG SL tablet For chest pain, tightness, or pressure. While sitting, place 1 tablet under tongue. May be used every 5 minutes as needed, for up to 15 minutes. Do not use more than 3 tablets. 25 tablet 1   Polyethyl Glycol-Propyl Glycol (SYSTANE OP) Place 1 drop into both eyes 3 (three) times daily as needed (dry/irritated eyes.).      polyethylene glycol (MIRALAX / GLYCOLAX) 17 g packet Take 17 g by mouth daily as needed for mild constipation.      QUEtiapine (SEROQUEL) 25 MG tablet Take 1 tablet (25 mg total) by mouth at bedtime. 30 tablet 0   ranolazine (RANEXA) 500 MG 12 hr tablet  Take 1 tablet (500 mg total) by mouth 2 (two) times daily. 180 tablet 2   rosuvastatin (CRESTOR) 5 MG tablet TAKE 1 TABLET(5 MG) BY MOUTH DAILY (Patient taking differently: Take 5 mg by mouth at bedtime.) 90 tablet 0   tamsulosin (FLOMAX) 0.4 MG CAPS capsule Take 1 capsule (0.4 mg total) by  mouth daily after supper. 30 capsule 11   torsemide (DEMADEX) 20 MG tablet Take 4 tablets (80 mg) in the morning and take 2 tablets (40 mg) in the evening. 180 tablet 0   traMADol (ULTRAM) 50 MG tablet Take 1-2 tablets (50-100 mg total) by mouth at bedtime as needed. (Patient taking differently: Take 50-100 mg by mouth at bedtime as needed (pain.).) 30 tablet 2   No facility-administered medications prior to visit.

## 2022-08-21 NOTE — Telephone Encounter (Signed)
CCM RN Visit Note   @DATE @ Name: JERIC PAWELSKI MRN: [id number]      DOB: 30-Oct-1933  Subjective: Lucas Caldwell is a 89 y.o. year old male who is a primary care patient of Smitty Cords, DO. The patient was referred to the Chronic Care Management team for assistance with care management needs subsequent to provider initiation of CCM services and plan of care.      Today's Visit:  Spoke to the patients wife and DPR, [name]  for follow up visit.      Goals Addressed             This Visit's Progress    COMPLETED: CCM (DIABETES) EXPECTED OUTCOME: MONITOR, SELF-MANAGE AND REDUCE SYMPTOMS OF DIABETES       Current Barriers: The patient is now enrolled with Hospice services, CCM services completed Knowledge Deficits related to the importance of blood sugars control in the patient with DM and multiple other chronic conditions Care Coordination needs related to ongoing support and education  in a patient with DM Chronic Disease Management support and education needs related to effective management of DM A1C goal of <8.0% Lab Results  Component Value Date   HGBA1C 7.5 (A) 06/05/2022     Planned Interventions: Provided education to patient about basic DM disease process. Review and education provided. The patients A1C is trending down. Praised for working hard to get A1C down.; Reviewed medications with patient and discussed importance of medication adherence. The patient is compliant with medications. Is compliant with medications. Has medications and is working with the pharm D on a regular basis for effective management of medications and changes in medications. The patient states compliance with his medications. His wife was not there today to talk with the RNCM. The patient states he gets mixed up sometimes but his wife keeps him straight.        Reviewed prescribed diet with patient heart healthy/ADA. The patient and his wife are mindful of dietary restrictions.  ; Counseled on importance of regular laboratory monitoring as prescribed. Has labs on a regular basis. Education and support given;        Discussed plans with patient for ongoing care management follow up and provided patient with direct contact information for care management team;      Provided patient with written educational materials related to hypo and hyperglycemia and importance of correct treatment;       Reviewed scheduled/upcoming provider appointments including: 10-02-2022 at 240 pm       Advised patient, providing education and rationale, to check cbg twice daily and when you have symptoms of low or high blood sugar and record. Per pharmacy noted the patient has been on better target of his blood sugars. His wife helps makes sure he is taking his medications as ordered.  Education given for the goal of fasting to be 130 of less and post prandial of <180.    call provider for findings outside established parameters;       Referral made to pharmacy team for assistance with ongoing support and education for medication needs., The patient works with the pharm D on a regular basis to support and provided ongoing help and education;       Review of patient status, including review of consultants reports, relevant laboratory and other test results, and medications completed;       Advised patient to discuss changes in DM and other questions and concerns with provider;  Screening for signs and symptoms of depression related to chronic disease state;        Assessed social determinant of health barriers;  Eye exam completed:  06-10-2022 Had foot exam on 06-05-2022. The patient is compliant with regular preventive care.     The patients wife states that the patient needs to be seen by the pcp. The patient has an appointment this afternoon to see pcp. Possible UTI. The patient does not feel well and is not his normal self. Education and support given. Collaboration with the pcp and office staff.       Symptom Management: Take medications as prescribed   Attend all scheduled provider appointments Call provider office for new concerns or questions  call the Suicide and Crisis Lifeline: 988 call the [place] National Suicide Prevention Lifeline: 718-794-3310 or TTY: 9514309975 TTY 445-630-4147) to talk to a trained counselor call 1-800-273-TALK (toll free, 24 hour hotline) if experiencing a Mental Health or Behavioral Health Crisis  check feet daily for cuts, sores or redness trim toenails straight across wash and dry feet carefully every day wear comfortable, cotton socks wear comfortable, well-fitting shoes  Follow Up Plan: No further follow up required: The patient is now enrolled with Hospice Services         COMPLETED: CCM Expected Outcome:  Monitor, Self-Manage and Reduce Symptoms of Heart Failure       Current Barriers: Closing the goal. The patient is now with Hospice services  Knowledge Deficits related to the need to keep weights in balance and to monitor for changes that impact heart failure and cause exacerbations Care Coordination needs related to ongoing support and education needs in a patient with CHF Chronic Disease Management support and education needs related to effective management of CHF  Wt Readings from Last 3 Encounters:  07/17/22 195 lb 4 oz (88.6 kg)  07/09/22 199 lb (90.3 kg)  07/08/22 199 lb (90.3 kg)    Planned Interventions: Basic overview and discussion of pathophysiology of Heart Failure reviewed. The patient was at the HF clinic last week. Weight is stable and the patient states that sometimes he has shortness of breath, sometimes not. The patient denies any acute distress today.  Provided education on low sodium diet. The patient wife states they are really watching his sodium content in foods Reviewed Heart Failure Action Plan in depth and provided written copy Assessed need for readable accurate scales in home. The patient has a mat that he  weighs on daily that reports his weight to the heart failure clinic. His weight has been fluctuating some and last week he had to change the way he takes his his diuretic for a few days. His wife states his weight has come back down and he continues to submit his readings every day. Denies any acute changes today. He has parameters to go by to make sure he does not go into exacerbation. He and his wife have a good understanding of how to effectively manage his heart failure.  Provided education about placing scale on hard, flat surface Advised patient to weigh each morning after emptying bladder Discussed importance of daily weight and advised patient to weigh and record daily Reviewed role of diuretics in prevention of fluid overload and management of heart failure. The patient is working with the cardiologist for trying to figure out the best way to keep fluids and weight balanced. [name] compliant with medications. The wife knows to call for acute changes and get recommendations from the provider.  Also is working with the pharm D for effective management of his HF medications and other medications. Saw HF clinic provider recently as well as his regular cardiologist.  Discussed the importance of keeping all appointments with provider. The patient saw the cardiologist last week Sees on a regular basis. Knows to call for changes on new concerns.  Next appointment to follow up with the pcp will be 10-02-2022 at 240 pm Advised patient to discuss changes in heart failure with provider Screening for signs and symptoms of depression related to chronic disease state  Assessed social determinant of health barriers Wife states the patients fluid balance is good but the patient is "off". Wants to see the pcp. Appointment for this afternoon to be evaluated.   Symptom Management: Take medications as prescribed   Attend all scheduled provider appointments Call provider office for new concerns or questions  call the  Suicide and Crisis Lifeline: 988 call the [place] National Suicide Prevention Lifeline: (604)120-2580 or TTY: 703 868 3592 TTY 906-207-8782) to talk to a trained counselor call 1-800-273-TALK (toll free, 24 hour hotline) if experiencing a Mental Health or Behavioral Health Crisis  call office if I gain more than 2 pounds in one day or 5 pounds in one week track weight in diary use salt in moderation watch for swelling in feet, ankles and legs every day weigh myself daily develop a rescue plan follow rescue plan if symptoms flare-up track symptoms and what helps feel better or worse  Follow Up Plan: No further follow up required: the patient is now enrolled in Hospice services          COMPLETED: CCM Expected Outcome:  Monitor, Self-Manage, and Reduce Symptoms of Hypertension       Current Barriers: The patient is now enrolled in Hospice Service. Plan of care closed Chronic Disease Management support and education needs related to effective management of HTN BP Readings from Last 3 Encounters:  08/13/22 116/76  08/07/22 108/72  08/02/22 122/68     Planned Interventions: Evaluation of current treatment plan related to hypertension self management and patient's adherence to plan as established by provider. The patient is having more stable blood pressures. The patients wife states over all he is doing well. The patient feels like he is confused today. Education and support given. He states that his wife has gone to the doctor for herself. She will be back later. He states overall he is doing well. Denies any acute changes in his conditions.  Provided education to patient re: stroke prevention, s/s of heart attack and stroke; Reviewed prescribed diet heart healthy/ADA diet. The patient has a good appetite and is eating well. [name] mindful of dietary restrictions and follows a heart healthy/ADA diet  Reviewed medications with patient and discussed importance of compliance. The patient is  compliant with medications. The patient is compliant with medications. Works with pharm D on a regular basis for effective management of medications. ;  Discussed plans with patient for ongoing care management follow up and provided patient with direct contact information for care management team; Advised patient, providing education and rationale, to monitor blood pressure daily and record, calling PCP for findings outside established parameters;  Advised patient to discuss changes in blood pressures and heart health with provider; Provided education on prescribed diet heart healthy/ADA diet ;  Discussed complications of poorly controlled blood pressure such as heart disease, stroke, circulatory complications, vision complications, kidney impairment, sexual dysfunction;  Screening for signs and symptoms of depression related to  chronic disease state;  Assessed social determinant of health barriers;   Symptom Management: Take medications as prescribed   Attend all scheduled provider appointments Call provider office for new concerns or questions  call the Suicide and Crisis Lifeline: 988 call the [place] National Suicide Prevention Lifeline: (347)351-4983 or TTY: 914-373-4546 TTY 617-082-0305) to talk to a trained counselor call 1-800-273-TALK (toll free, 24 hour hotline) if experiencing a Mental Health or Behavioral Health Crisis  check blood pressure 3 times per week write blood pressure results in a log or diary learn about high blood pressure keep a blood pressure log take blood pressure log to all doctor appointments call doctor for signs and symptoms of high blood pressure develop an action plan for high blood pressure keep all doctor appointments take medications for blood pressure exactly as prescribed report new symptoms to your doctor  Follow Up Plan: No further follow up required: the patient is now with hospice services            Plan:No further follow up required: the  patient is now with Hospice Services  Alto Denver RN, MSN, CCM RN Care Manager  Chronic Care Management Direct Number: [phone]

## 2022-08-21 NOTE — Patient Instructions (Signed)
Continue dosage of warfarin of 1.5 TABLETS EVERY DAY EXCEPT 1 TABLET ON MONDAYS & FRIDAYS.  Recheck INR in 6 weeks 

## 2022-08-21 NOTE — Progress Notes (Signed)
Follow-up Outpatient Visit Date: 08/21/2022  Primary Care Provider: Smitty Cords, DO [address] [place] [address]  Chief Complaint: Follow-up CAD and HFrEF  HPI:  Lucas Caldwell is a 89 y.o. male with history of coronary artery disease status post prior RCA, circumflex, and LAD stenting, ischemic cardiomyopathy, chronic combined systolic and diastolic congestive heart failure, paroxysmal atrial fibrillation, hypertension, hyperlipidemia, diabetes, chronic kidney disease stage 3b, CML, iron deficiency anemia, GI bleed due to duodenal ulcers (12/2017), BPH, hypothyroidism, and peripheral neuropathy with unsteady gait, who presents for follow-up of coronary artery disease and heart failure.  He was hospitalized last month with acute on chronic heart failure and underwent right and left heart catheterization with Dr. Augustina Mood on 08/09/2022.  He was found to have severe distal LMCA disease and stenting into the ostial LAD and LCx with elevated left and right heart filling pressures and low cardiac output (PCWP 34, RA 14, Fick CO/CI 4.1/2.1).  Today, Lucas Caldwell reports that he is actually feeling fairly well, though on further questioning he notes continued exertional dyspnea with mild activity.  He also feels dizzy most of the time, described as the feeling of spinning.  It is worst when he closes his eyes.  He has received supplemental oxygen, a walker, and a wheelchair from hospice.  He uses the supplemental oxygen intermittently with improvement in his shortness of breath.  He has not fallen.  He denies chest pain, palpitations, and edema.  He and his wife voice concern about recent enrollment in hospice.  Lucas Caldwell does not recall any specifics about his recent hospitalization at [facility].  --------------------------------------------------------------------------------------------------  Past Medical History:  Diagnosis Date   Arthritis    "probably in his legs" (08/15/2016)    Blind right eye    BPH (benign prostatic hyperplasia)    Breast asymmetry    Left breast is larger, present for several years.   CAD (coronary artery disease)    a. Cath in the late 90's - reportedly ok;  b. 2014 s/p stenting x 2 @ [facility]; c 08/19/16 Cath/PCI with DES -> RCA, plan to treat LM 70% medically. Seen by surgery and felt to be too high risk for CABG; d. 08/2018 Cath: LM 70 (iFR 0.86), LAD 20ost, 40p, 50/19m, D1 70, LCX patent stent, OM1 30, RCA patent stent, 75m ISR, 40d, RPDA 30. PCWP 8. CO/CI 4.0/2.1; e. 03/2019 PCI to LAD (2.5x15 Resolute Onyx DES).   Chronic combined systolic (congestive) and diastolic (congestive) heart failure (HCC)    a. Previously reduced EF-->50% by echo in 2012;  b. 06/2015 Echo: EF 50-55%  c. 07/2016 Echo: EF 45-50%; d. 12/2017 Echo: EF 55%; e. 08/2018 Echo: EF 35-40%; f. 04/2019 Echo: EF 30-35%; g. s/p cardiomems; h. 10/2020 Echo: EF 30-35%; i. 01/2022 Echo: EF 30-35%, glob HK, GrII DD, nl rV fxn, mild to mod MR.   Chronic kidney disease (CKD), stage IV (severe) (HCC)    CML (chronic myelocytic leukemia) (HCC)    GERD (gastroesophageal reflux disease)    GIB (gastrointestinal bleeding)    a. 12/2017 3 unit PRBC GIB in setting of coumadin-->Endo/colon multiple duodenal ulcers and a single bleeding ulcer in the proximal ascending colon status post hemostatic clipping x2.   Gout    Hyperlipemia    Hypertension    Hypothyroidism    Iron deficiency anemia    Ischemic cardiomyopathy    a. Previously reduced EF-->50% by echo in 2012;  b. 06/2015 Echo: EF 50-55%, Gr2  DD;  c. 07/2016 Echo: EF 45-50%; d. 12/2017 Echo: EF 55%, Gr1 DD, mild MR; e. 08/2018 Echo: EF 35-40%; f. 04/2019 Echo: EF 30-35%; g. 10/2020 Echo: EF 30-35%; h. Echo: EF 30-35%   Migraine    "in the 1960s" (08/15/2016)   Obstructive sleep apnea    Orthostatic hypotension    a. 20222 -->carvedilol and hydralazine d/c'd.   PAF (paroxysmal atrial fibrillation) (HCC)    a. ? Dx 2014-->s/p DCCV;  b. CHA2DS2VASc = 6-->  Coumadin.   Prostate cancer (HCC)    RBBB    Type II diabetes mellitus [facility])    Past Surgical History:  Procedure Laterality Date   AMPUTATION TOE Right 02/23/2021   Procedure: AMPUTATION TOE;  Surgeon: Gwyneth Revels, DPM;  Location: [facility] ORS;  Service: Podiatry;  Laterality: Right;   CATARACT EXTRACTION W/ INTRAOCULAR LENS  IMPLANT, BILATERAL Bilateral    COLONOSCOPY N/A 01/13/2018   Procedure: COLONOSCOPY;  Surgeon: Toney Reil, MD;  Location: [facility] ENDOSCOPY;  Service: Gastroenterology;  Laterality: N/A;   COLONOSCOPY WITH PROPOFOL N/A 01/01/2018   Procedure: COLONOSCOPY WITH PROPOFOL;  Surgeon: Toney Reil, MD;  Location: [facility] ENDOSCOPY;  Service: Gastroenterology;  Laterality: N/A;   CORONARY ANGIOPLASTY WITH STENT PLACEMENT  09/2012   2 stents   CORONARY STENT INTERVENTION N/A 08/19/2016   Procedure: Coronary Stent Intervention;  Surgeon: Yvonne Kendall, MD;  [facility]: [facility] INVASIVE CV LAB;  Service: Cardiovascular;  Laterality: N/A;   CORONARY STENT INTERVENTION N/A 04/13/2019   Procedure: CORONARY STENT INTERVENTION;  Surgeon: Yvonne Kendall, MD;  Location: [facility] INVASIVE CV LAB;  Service: Cardiovascular;  Laterality: N/A;   ESOPHAGEAL MANOMETRY N/A 12/16/2017   Procedure: ESOPHAGEAL MANOMETRY (EM);  Surgeon: Toney Reil, MD;  Location: [facility] ENDOSCOPY;  Service: Gastroenterology;  Laterality: N/A;   ESOPHAGOGASTRODUODENOSCOPY N/A 12/20/2016   Procedure: ESOPHAGOGASTRODUODENOSCOPY (EGD);  Surgeon: Toney Reil, MD;  Location: [facility] ENDOSCOPY;  Service: Gastroenterology;  Laterality: N/A;   ESOPHAGOGASTRODUODENOSCOPY N/A 01/13/2018   Procedure: ESOPHAGOGASTRODUODENOSCOPY (EGD);  Surgeon: Toney Reil, MD;  Location: [facility] ENDOSCOPY;  Service: Gastroenterology;  Laterality: N/A;   ESOPHAGOGASTRODUODENOSCOPY (EGD) WITH PROPOFOL N/A 11/03/2017   Procedure: ESOPHAGOGASTRODUODENOSCOPY (EGD) WITH PROPOFOL;  Surgeon: Toney Reil, MD;  Location: [facility]  ENDOSCOPY;  Service: Gastroenterology;  Laterality: N/A;   EYE SURGERY     HERNIA REPAIR     "navel"   LEFT HEART CATH AND CORONARY ANGIOGRAPHY N/A 08/14/2016   Procedure: Left Heart Cath and Coronary Angiography;  Surgeon: Yvonne Kendall, MD;  Location: [facility] INVASIVE CV LAB;  Service: Cardiovascular;  Laterality: N/A;   PRESSURE SENSOR/CARDIOMEMS N/A 09/23/2019   Procedure: PRESSURE SENSOR/CARDIOMEMS;  Surgeon: Dolores Patty, MD;  Location: [facility] INVASIVE CV LAB;  Service: Cardiovascular;  Laterality: N/A;   RIGHT HEART CATH N/A 09/23/2019   Procedure: RIGHT HEART CATH;  Surgeon: Dolores Patty, MD;  Location: [facility] INVASIVE CV LAB;  Service: Cardiovascular;  Laterality: N/A;   RIGHT/LEFT HEART CATH AND CORONARY ANGIOGRAPHY N/A 09/01/2018   Procedure: RIGHT/LEFT HEART CATH AND CORONARY ANGIOGRAPHY;  Surgeon: Yvonne Kendall, MD;  Location: [facility] INVASIVE CV LAB;  Service: Cardiovascular;  Laterality: N/A;   RIGHT/LEFT HEART CATH AND CORONARY ANGIOGRAPHY N/A 04/13/2019   Procedure: RIGHT/LEFT HEART CATH AND CORONARY ANGIOGRAPHY;  Surgeon: Yvonne Kendall, MD;  Location: [facility] INVASIVE CV LAB;  Service: Cardiovascular;  Laterality: N/A;   RIGHT/LEFT HEART CATH AND CORONARY ANGIOGRAPHY N/A 08/09/2022   Procedure: RIGHT/LEFT HEART CATH AND CORONARY ANGIOGRAPHY;  Surgeon: Dolores Patty, MD;  [facility] CV [facility];  Service: Cardiovascular;  Laterality: N/A;   TOE AMPUTATION Right    "big toe"     Recent CV Pertinent Labs: Lab Results  Component Value Date   CHOL 156 12/05/2021   CHOL 123 06/29/2020   HDL 33 (L) 12/05/2021   HDL 29 (L) 06/29/2020   LDLCALC 92 12/05/2021   TRIG 211 (H) 12/05/2021   CHOLHDL 4.7 12/05/2021   INR 2.0 08/21/2022   INR 1.8 (H) 08/10/2022   INR 2.5 05/17/2012   BNP 710.9 (H) 08/07/2022   K 3.6 08/13/2022   K 4.2 05/18/2012   MG 2.4 08/13/2022   BUN 27 (H) 08/13/2022   BUN 23 05/16/2021   BUN 38 (H) 05/18/2012   CREATININE 1.84 (H) 08/13/2022    CREATININE 1.96 (H) 12/05/2021    Past medical and surgical history were reviewed and updated in EPIC.  Current Meds  Medication Sig   acetaminophen (TYLENOL) 500 MG tablet Take 1,000 mg by mouth 2 (two) times daily as needed for moderate pain or headache.   Alcohol Swabs (B-D SINGLE USE SWABS REGULAR) PADS Use to check blood sugar up to 2 times daily   allopurinol (ZYLOPRIM) 100 MG tablet TAKE 1 TABLET(100 MG) BY MOUTH DAILY (Patient taking differently: Take 100 mg by mouth every evening.)   aspirin 81 MG chewable tablet Chew 81 mg by mouth in the morning.   Fluticasone-Umeclidin-Vilant (TRELEGY ELLIPTA) 100-62.5-25 MCG/ACT AEPB Inhale 1 puff into the lungs daily.   gabapentin (NEURONTIN) 600 MG tablet Take 600 mg by mouth 2 (two) times daily.   Insulin Glargine ([name] KWIKPEN) 100 UNIT/ML Inject 22 Units into the skin daily. (Patient taking differently: Inject 22 Units into the skin at bedtime.)   levothyroxine (SYNTHROID) 25 MCG tablet TAKE 1 TABLET BY MOUTH EVERY MORNING BEFORE BREAKFAST   Menthol-Zinc Oxide (GOLD BOND EX) Apply 1 application topically daily as needed (muscle pain).   nitroGLYCERIN (NITROSTAT) 0.4 MG SL tablet For chest pain, tightness, or pressure. While sitting, place 1 tablet under tongue. May be used every 5 minutes as needed, for up to 15 minutes. Do not use more than 3 tablets.   Polyethyl Glycol-Propyl Glycol (SYSTANE OP) Place 1 drop into both eyes 3 (three) times daily as needed (dry/irritated eyes.).    polyethylene glycol (MIRALAX / GLYCOLAX) 17 g packet Take 17 g by mouth daily as needed for mild constipation.    QUEtiapine (SEROQUEL) 25 MG tablet Take 1 tablet (25 mg total) by mouth at bedtime.   ranolazine (RANEXA) 500 MG 12 hr tablet Take 1 tablet (500 mg total) by mouth 2 (two) times daily.   rosuvastatin (CRESTOR) 5 MG tablet TAKE 1 TABLET(5 MG) BY MOUTH DAILY (Patient taking differently: Take 5 mg by mouth at bedtime.)   tamsulosin (FLOMAX) 0.4 MG  CAPS capsule Take 1 capsule (0.4 mg total) by mouth daily after supper.   torsemide (DEMADEX) 20 MG tablet Take 4 tablets (80 mg) in the morning and take 2 tablets (40 mg) in the evening.   traMADol (ULTRAM) 50 MG tablet Take 1-2 tablets (50-100 mg total) by mouth at bedtime as needed. (Patient taking differently: Take 50-100 mg by mouth at bedtime as needed (pain.).)    Allergies: Clopidogrel, Ciprofloxacin, Beta adrenergic blockers, Mirtazapine, and Spironolactone  Social History   Tobacco Use   Smoking status: Former    Packs/day: 1.75    Years: 20.00    Additional pack years: 0.00    Total pack years:  35.00    Types: Cigarettes    Start date: 20    Quit date: 56    Years since quitting: 49.4   Smokeless tobacco: Former  Building services engineer Use: Never used  Substance Use Topics   Alcohol use: No    Comment: previously drank heavily - quit 1979.   Drug use: No    Family History  Problem Relation Age of Onset   Brain cancer Father    Diabetes Sister    Heart attack Sister     Review of Systems: A 12-system review of systems was performed and was negative except as noted in the HPI.  --------------------------------------------------------------------------------------------------  Physical Exam: BP 112/72 (BP Location: Right Arm, Patient Position: Sitting, Cuff Size: Normal)   Pulse 96   Ht 5\' 3"  (1.6 m)   Wt 188 lb 3.2 oz (85.4 kg)   SpO2 96%   BMI 33.34 kg/m   General:  NAD.  Accompanied by his wife Neck: No JVD or HJR. Lungs: Clear to auscultation bilaterally without wheezes or crackles. Heart: Regular rate and rhythm without murmurs, rubs, or gallops. Abdomen: Soft, nontender, nondistended. Extremities: No lower extremity edema.  EKG:  Normal sinus rhythm with PVC's, left axis deviation, RBBB, and septal infarct.  Lab Results  Component Value Date   WBC 9.6 08/11/2022   HGB 13.2 08/11/2022   HCT 39.7 08/11/2022   MCV 84.8 08/11/2022   PLT 197  08/11/2022    Lab Results  Component Value Date   NA 134 (L) 08/13/2022   K 3.6 08/13/2022   CL 98 08/13/2022   CO2 22 08/13/2022   BUN 27 (H) 08/13/2022   CREATININE 1.84 (H) 08/13/2022   GLUCOSE 136 (H) 08/13/2022   ALT 23 08/11/2022    Lab Results  Component Value Date   CHOL 156 12/05/2021   HDL 33 (L) 12/05/2021   LDLCALC 92 12/05/2021   TRIG 211 (H) 12/05/2021   CHOLHDL 4.7 12/05/2021    --------------------------------------------------------------------------------------------------  ASSESSMENT AND PLAN: Chronic HFrEF: Lucas Caldwell appears euvolemic on exam today and reports stable dyspnea on exertion since leaving the hospital last month (NYHA class III).  His weight is up 8 pounds since last hospital weight, though he is less than prior outpatient visits.  I recommend continuation of his current regimen of torsemide 80 mg QAM and 40 mg QPM.  We will defer adding GDMT at this time given his renal insufficiency, low cardiac output, and borderline low BP with chronic dizziness.  Follow-up as planned with Dr. Gala Romney next month.  Continue hospice care, including supplemental oxygen as needed for dyspnea.  Coronary artery disease: Diagnostic catheterization last month showed severe distal LMCA disease involving the ostial LAD and LCx that is likely driving his worsening cardiomyopathy and may also be contributing to some of his exertional dyspnea.  The anatomy is best-suited for CABG, though CHIP PCI with mechanical support would also be a consideration.  However, given Lucas Caldwell' advanced age, advanced CKD, and cognitive impairment with severe delirium during recent hospitalization, I do not think he would survive either procedure and be able to return home with improved quality of life.  We have discussed this and have agreed to a symptom-management strategy with continued involvement of hospice.  We will continue aspirin, rosuvastatin, and ranolazine for now, though I would  have a low threshold for stopping rosuvastatin in the future.  Ranolazine can be continued as a palliative medication to help prevent/minimize angina.  Paroxysmal atrial  fibrillation: EKG continues to show sinus rhythm.  We will continue anticoagulation with warfarin, though I have a low threshold for stopping this in the setting of palliative treatment of his severe CAD and HFrEF.  We will readdress this at our follow-up visit.  Chronic kidney disease stage IIIB: Continue diuresis with monitoring of renal function and avoidance of nephrotoxic drugs.  Follow-up: Return to see Dr. Gala Romney as scheduled next month; return to see me in 2 months.  Yvonne Kendall, MD 08/21/2022 9:01 PM

## 2022-08-21 NOTE — Patient Instructions (Signed)
Medication Instructions:  Your Physician recommend you continue on your current medication as directed.     *If you need a refill on your cardiac medications before your next appointment, please call your pharmacy*   Lab Work: None ordered today   Testing/Procedures: None ordered today   Follow-Up: At [facility], you and your health needs are our priority.  As part of our continuing mission to provide you with exceptional heart care, we have created designated Provider Care Teams.  These Care Teams include your primary Cardiologist (physician) and Advanced Practice Providers (APPs -  Physician Assistants and Nurse Practitioners) who all work together to provide you with the care you need, when you need it.  We recommend signing up for the patient portal called "MyChart".  Sign up information is provided on this After Visit Summary.  MyChart is used to connect with patients for Virtual Visits (Telemedicine).  Patients are able to view lab/test results, encounter notes, upcoming appointments, etc.  Non-urgent messages can be sent to your provider as well.   To learn more about what you can do with MyChart, go to [email or web address].    Your next appointment:   2 month(s)  Provider:   You may see Yvonne Kendall, MD or one of the following Advanced Practice Providers on your designated Care Team:   Nicolasa Ducking, NP Eula Listen, PA-C Cadence Fransico Michael, PA-C Charlsie Quest, NP

## 2022-08-23 ENCOUNTER — Other Ambulatory Visit: Payer: Self-pay | Admitting: Family Medicine

## 2022-08-23 DIAGNOSIS — K219 Gastro-esophageal reflux disease without esophagitis: Secondary | ICD-10-CM

## 2022-08-23 NOTE — Telephone Encounter (Signed)
Requested Prescriptions  Refused Prescriptions Disp Refills   famotidine (PEPCID) 40 MG tablet [Pharmacy Med Name: FAMOTIDINE 40MG  TABLETS] 180 tablet 0    Sig: TAKE 1 TABLET BY MOUTH TWICE DAILY WITH A MEAL     Gastroenterology:  H2 Antagonists Passed - 08/23/2022  3:15 PM      Passed - Valid encounter within last 12 months    Recent Outpatient Visits           1 month ago Confusion   [place] [facility] [place], [name], DO   2 months ago Controlled type 2 diabetes mellitus with diabetic nephropathy, with long-term current use of insulin [facility])   [place] [facility], [name], DO   3 months ago Controlled type 2 diabetes mellitus with diabetic nephropathy, with long-term current use of insulin [facility])   [place] [facility], [name], DO   5 months ago Influenza A   [place] Smitty Cords, DO   8 months ago Annual physical exam   [place] [facility], [name], DO       Future Appointments             In 1 month [name], [name], DO [name]   In 2 months [place], [name], MD [facility]

## 2022-08-26 ENCOUNTER — Other Ambulatory Visit: Payer: Self-pay | Admitting: *Deleted

## 2022-08-26 ENCOUNTER — Other Ambulatory Visit: Payer: Self-pay

## 2022-08-26 ENCOUNTER — Telehealth: Payer: Self-pay | Admitting: Internal Medicine

## 2022-08-26 DIAGNOSIS — I480 Paroxysmal atrial fibrillation: Secondary | ICD-10-CM

## 2022-08-26 MED ORDER — ROSUVASTATIN CALCIUM 5 MG PO TABS: 5.0000 mg | ORAL_TABLET | Freq: Every day | ORAL | 0 refills | Status: DC

## 2022-08-26 MED ORDER — WARFARIN SODIUM 3 MG PO TABS
ORAL_TABLET | ORAL | 1 refills | Status: AC
Start: 2022-08-26 — End: ?

## 2022-08-26 MED ORDER — WARFARIN SODIUM 3 MG PO TABS
ORAL_TABLET | ORAL | 0 refills | Status: DC
Start: 2022-08-26 — End: 2022-08-26

## 2022-08-26 NOTE — Telephone Encounter (Signed)
Refill request for Warfarin

## 2022-08-26 NOTE — Telephone Encounter (Signed)
*  STAT* If patient is at the pharmacy, call can be transferred to refill team.   1. Which medications need to be refilled? (please list name of each medication and dose if known)  rosuvastatin (CRESTOR) 5 MG tablet  Warfarin 3mg    2. Which pharmacy/location (including street and city if local pharmacy) is medication to be sent to? [id number] - [place], [place] - [address] AT [facility] OF [address]   3. Do they need a 30 day or 90 day supply? 90 day   Patient only has two days of medication left.

## 2022-08-26 NOTE — Telephone Encounter (Signed)
Warfarin 3mg  30 day faxed refill received Afib Last INR 08/21/22 Last OV 08/21/22

## 2022-08-28 ENCOUNTER — Encounter (INDEPENDENT_AMBULATORY_CARE_PROVIDER_SITE_OTHER): Payer: Medicare HMO

## 2022-09-02 ENCOUNTER — Ambulatory Visit: Payer: Medicare HMO | Admitting: Physician Assistant

## 2022-09-02 ENCOUNTER — Encounter: Payer: Self-pay | Admitting: Physician Assistant

## 2022-09-03 ENCOUNTER — Ambulatory Visit: Payer: Self-pay | Admitting: *Deleted

## 2022-09-03 NOTE — Telephone Encounter (Signed)
Reason for Disposition  [1] MODERATE longstanding difficulty breathing (e.g., speaks in phrases, SOB even at rest, pulse 100-120) AND [2] SAME as normal    Wife refused to answer my triage questions.   Insisted all he needed was an antibiotic called in.  She was short and curt with me.  Answer Assessment - Initial Assessment Questions 1. RESPIRATORY STATUS: "Describe your breathing?" (e.g., wheezing, shortness of breath, unable to speak, severe coughing)     Wife calling in.   I asked what his symptoms were as she said,   "Lady, he just needs an antibiotic".    I let her know Dr. [name] would need to see him before calling in an antibiotic.  I would be glad to make an appt.   She again said,   "I just want an antibiotic called in for him".    "He has a cold and is coughing and just needs an antibiotic".   She was short and curt in her answers and would not answer any more of my triage questions. As I was looking for an appt she said,   "He needs to be seen today for an antibiotic".    There were no openings with Dr. [name] or Nicki Reaper, NP until later in June.   I let her know I would sent a message and see if they could possibly work him in.   She was agreeable to this plan.   "He just needs an antibiotic for the cold and coughing".    2. ONSET: "When did this breathing problem begin?"      When I asked this she did not want to answer any more questions.   "He just needs an antibiotic called in".    3. PATTERN "Does the difficult breathing come and go, or has it been constant since it started?"      He has some shortness of breath with this cold he has.  4. SEVERITY: "How bad is your breathing?" (e.g., mild, moderate, severe)    - MILD: No SOB at rest, mild SOB with walking, speaks normally in sentences, can lie down, no retractions, pulse < 100.    - MODERATE: SOB at rest, SOB with minimal exertion and prefers to sit, cannot lie down flat, speaks in phrases, mild retractions, audible  wheezing, pulse 100-120.    - SEVERE: Very SOB at rest, speaks in single words, struggling to breathe, sitting hunched forward, retractions, pulse > 120      Not asked the rest of the way down the list as wife did not want to answer any more questions.   Insistent that he just needed an antibiotic called in. 5. RECURRENT SYMPTOM: "Have you had difficulty breathing before?" If Yes, ask: "When was the last time?" and "What happened that time?"       6. CARDIAC HISTORY: "Do you have any history of heart disease?" (e.g., heart attack, angina, bypass surgery, angioplasty)       7. LUNG HISTORY: "Do you have any history of lung disease?"  (e.g., pulmonary embolus, asthma, emphysema)      8. CAUSE: "What do you think is causing the breathing problem?"       9. OTHER SYMPTOMS: "Do you have any other symptoms? (e.g., dizziness, runny nose, cough, chest pain, fever)      10. O2 SATURATION MONITOR:  "Do you use an oxygen saturation monitor (pulse oximeter) at home?" If Yes, ask: "What is your reading (oxygen level) today?" "What is  your usual oxygen saturation reading?" (e.g., 95%)        11. PREGNANCY: "Is there any chance you are pregnant?" "When was your last menstrual period?"        12. TRAVEL: "Have you traveled out of the country in the last month?" (e.g., travel history, exposures)  Protocols used: Breathing Difficulty-A-AH

## 2022-09-03 NOTE — Telephone Encounter (Signed)
  Chief Complaint: Wife called in saying her husband has a cold and coughing and just needs an antibiotic called in for the infection.   She got short and curt with me and would not answer but 2 of my triage questions so very few details. Symptoms: "He has a cold and coughing".    "He just needs an antibiotic called in for the infection". Frequency: Unable to get information from wife. Pertinent Negatives: Patient denies N/A Disposition: [] ED /[] Urgent Care (no appt availability in office) / [x] Appointment(In office/virtual)/ []  [place] [facility]/ [] Home Care/ [] Refused Recommended Disposition /[] [place] [facility]/ [x]  Follow-up with PCP Additional Notes: No appts available until later in June with either provider.   I let her know I would send a message and see if he can be worked in.   She was agreeable to this plan.    "He needs to be seen today" per wife.    She called in while the office was closed for lunch.  I had to say something to her about talking hateful and curt with me.   I was just trying to help her out and I did not appreciate being spoken to so hatefully.   She did not say anything in response.

## 2022-09-03 NOTE — Telephone Encounter (Signed)
Spoke with [facility] and appointment scheduled.

## 2022-09-04 ENCOUNTER — Ambulatory Visit (INDEPENDENT_AMBULATORY_CARE_PROVIDER_SITE_OTHER): Payer: Medicare HMO | Admitting: Family Medicine

## 2022-09-04 ENCOUNTER — Encounter: Payer: Self-pay | Admitting: Family Medicine

## 2022-09-04 VITALS — BP 100/62 | HR 90 | Ht 63.0 in | Wt 192.0 lb

## 2022-09-04 DIAGNOSIS — Z515 Encounter for palliative care: Secondary | ICD-10-CM | POA: Diagnosis not present

## 2022-09-04 DIAGNOSIS — J0110 Acute frontal sinusitis, unspecified: Secondary | ICD-10-CM | POA: Diagnosis not present

## 2022-09-04 MED ORDER — AMOXICILLIN-POT CLAVULANATE 875-125 MG PO TABS
1.0000 | ORAL_TABLET | Freq: Two times a day (BID) | ORAL | 0 refills | Status: DC
Start: 2022-09-04 — End: 2022-12-03

## 2022-09-04 NOTE — Patient Instructions (Addendum)
Thank you for coming to the office today.  Sent rx Augmentin antibiotic twice a day for 10 days.  I recommend that you keep up with Heart Failure Doctors and Hospice Nurses as needed.  Please schedule a Follow-up Appointment to: Return if symptoms worsen or fail to improve.  If you have any other questions or concerns, please feel free to call the office or send a message through MyChart. You may also schedule an earlier appointment if necessary.  Additionally, you may be receiving a survey about your experience at our office within a few days to 1 week by e-mail or mail. We value your feedback.  [name], DO [facility], [place]

## 2022-09-04 NOTE — Progress Notes (Unsigned)
Subjective:    Patient ID: Lucas Caldwell, male    DOB: 09-18-1933, 89 y.o.   MRN: [id number]  Lucas Caldwell is a 89 y.o. male presenting on 09/04/2022 for URI (Started about a week ago)   HPI  Sinusitis Reports onset 1-2 weeks ago sinus drainage pressure congestion Not having worsening dyspnea or swelling or new concerns Admits thicker drainage discharge nasal cough at times      06/05/2022    7:00 PM 11/05/2021    9:24 AM 10/05/2021    3:12 PM  Depression screen PHQ 2/9  Decreased Interest 0 0 0  Down, Depressed, Hopeless 0 0 0  PHQ - 2 Score 0 0 0  Altered sleeping   0  Tired, decreased energy   1  Change in appetite   1  Feeling bad or failure about yourself    0  Trouble concentrating   0  Moving slowly or fidgety/restless   0  Suicidal thoughts   0  PHQ-9 Score   2  Difficult doing work/chores   Not difficult at all    Social History   Tobacco Use   Smoking status: Former    Packs/day: 1.75    Years: 20.00    Additional pack years: 0.00    Total pack years: 35.00    Types: Cigarettes    Start date: 7    Quit date: 1975    Years since quitting: 49.5   Smokeless tobacco: Former  Building services engineer Use: Never used  Substance Use Topics   Alcohol use: No    Comment: previously drank heavily - quit 1979.   Drug use: No    Review of Systems Per HPI unless specifically indicated above     Objective:    BP 100/62   Pulse 90   Ht 5\' 3"  (1.6 m)   Wt 192 lb (87.1 kg)   SpO2 98%   BMI 34.01 kg/m   Wt Readings from Last 3 Encounters:  09/04/22 192 lb (87.1 kg)  08/21/22 188 lb 3.2 oz (85.4 kg)  08/21/22 198 lb (89.8 kg)    Physical Exam Vitals and nursing note reviewed.  Constitutional:      General: [name] not in acute distress.    Appearance: [name] well-developed. [name] obese. [name] not diaphoretic.     Comments: Well-appearing, comfortable, cooperative  HENT:     Head: Normocephalic and atraumatic.  Eyes:     General:         Right eye: No discharge.        Left eye: No discharge.     Conjunctiva/sclera: Conjunctivae normal.  Neck:     Thyroid: No thyromegaly.  Cardiovascular:     Rate and Rhythm: Normal rate and regular rhythm.     Pulses: Normal pulses.     Heart sounds: Normal heart sounds. No murmur heard. Pulmonary:     Effort: Pulmonary effort is normal. No respiratory distress.     Breath sounds: Normal breath sounds. No wheezing or rales.  Musculoskeletal:        General: Normal range of motion.     Cervical back: Normal range of motion and neck supple.     Right lower leg: Edema present.     Left lower leg: Edema present.  Lymphadenopathy:     Cervical: No cervical adenopathy.  Skin:    General: Skin is warm and dry.     Findings: No erythema or  rash.  Neurological:     Mental Status: [name] alert and oriented to person, place, and time. Mental status is at baseline.  Psychiatric:        Behavior: Behavior normal.     Comments: Well groomed, good eye contact, normal speech and thoughts    Results for orders placed or performed in visit on 08/21/22  POCT INR  Result Value Ref Range   INR 2.0 2.0 - 3.0   POC INR     *Note: Due to a large number of results and/or encounters for the requested time period, some results have not been displayed. A complete set of results can be found in Results Review.      Assessment & Plan:   Problem List Items Addressed This Visit     Hospice care patient   Other Visit Diagnoses     Acute non-recurrent frontal sinusitis    -  Primary   Relevant Medications   amoxicillin-clavulanate (AUGMENTIN) 875-125 MG tablet       Consistent with acute frontal sinusitis, likely initially viral URI vs allergic rhinitis component with worsening concern for bacterial infection.   Plan:  Start Augmentin 875-125mg  PO BID x 10 days 2. Recommend Nasal steroid 3. Supportive care with nasal saline OTC, hydration, warm compresses and sinus effleurage as  demonstrated 4. Return criteria reviewed  Discussion of hospice care and the goals and monitoring with additional assistance from hospice   Meds ordered this encounter  Medications   amoxicillin-clavulanate (AUGMENTIN) 875-125 MG tablet    Sig: Take 1 tablet by mouth 2 (two) times daily.    Dispense:  20 tablet    Refill:  0      Follow up plan: Return if symptoms worsen or fail to improve.  [name], DO [facility] 09/04/2022, 4:38 PM

## 2022-09-05 ENCOUNTER — Encounter: Payer: Self-pay | Admitting: Family Medicine

## 2022-09-05 DIAGNOSIS — Z515 Encounter for palliative care: Secondary | ICD-10-CM | POA: Insufficient documentation

## 2022-09-09 ENCOUNTER — Telehealth: Payer: Self-pay | Admitting: Family Medicine

## 2022-09-09 ENCOUNTER — Other Ambulatory Visit: Payer: Self-pay | Admitting: Family Medicine

## 2022-09-09 DIAGNOSIS — K219 Gastro-esophageal reflux disease without esophagitis: Secondary | ICD-10-CM

## 2022-09-09 DIAGNOSIS — E1121 Type 2 diabetes mellitus with diabetic nephropathy: Secondary | ICD-10-CM

## 2022-09-09 NOTE — Telephone Encounter (Signed)
Famotidine 40 MG, not on current medication list.

## 2022-09-09 NOTE — Telephone Encounter (Signed)
Medication Refill - Medication: Famotidine 40 MG   Pt is completely out of his current supply   Has the patient contacted their pharmacy? Yes. (Agent: If no, request that the patient contact the pharmacy for the refill. If patient does not wish to contact the pharmacy document the reason why and proceed with request.) (Agent: If yes, when and what did the pharmacy advise?)  Preferred Pharmacy (with phone number or street name):  [facility][id number] [place], [place] - [address] AT [facility] OF [address] [facility]  [address] [place] [address]  Phone: [phone] Fax: [phone]   Has the patient been seen for an appointment in the last year OR does the patient have an upcoming appointment? Yes.    Agent: Please be advised that RX refills may take up to 3 business days. We ask that you follow-up with your pharmacy.

## 2022-09-10 MED ORDER — FAMOTIDINE 40 MG PO TABS
40.0000 mg | ORAL_TABLET | Freq: Two times a day (BID) | ORAL | 1 refills | Status: AC
Start: 2022-09-10 — End: ?

## 2022-09-10 NOTE — Telephone Encounter (Signed)
Requested Prescriptions  Refused Prescriptions Disp Refills   JARDIANCE 10 MG TABS tablet [Pharmacy Med Name: JARDIANCE 10MG  TABLETS] 90 tablet 0    Sig: TAKE 1 TABLET(10 MG) BY MOUTH DAILY WITH BREAKFAST     Endocrinology:  Diabetes - SGLT2 Inhibitors Failed - 09/09/2022  2:36 PM      Failed - Cr in normal range and within 360 days    Creat  Date Value Ref Range Status  12/05/2021 1.96 (H) 0.70 - 1.22 mg/dL Final   Creatinine, Ser  Date Value Ref Range Status  08/13/2022 1.84 (H) 0.61 - 1.24 mg/dL Final   Creatinine, Urine  Date Value Ref Range Status  12/18/2016 96 mg/dL Final         Failed - eGFR in normal range and within 360 days    GFR, Est African American  Date Value Ref Range Status  04/13/2020 41 (L) > OR = 60 mL/min/1.20m2 Final   GFR, Est Non African American  Date Value Ref Range Status  04/13/2020 36 (L) > OR = 60 mL/min/1.32m2 Final   GFR, Estimated  Date Value Ref Range Status  08/13/2022 35 (L) >60 mL/min Final    Comment:    (NOTE) Calculated using the CKD-EPI Creatinine Equation (2021)    eGFR  Date Value Ref Range Status  12/05/2021 33 (L) > OR = 60 mL/min/1.95m2 Final  05/16/2021 55 (L) >59 mL/min/1.73 Final         Passed - HBA1C is between 0 and 7.9 and within 180 days    Hemoglobin A1C  Date Value Ref Range Status  06/05/2022 7.5 (A) 4.0 - 5.6 % Final   Hgb A1c MFr Bld  Date Value Ref Range Status  12/05/2021 8.0 (H) <5.7 % of total Hgb Final    Comment:    For someone without known diabetes, a hemoglobin A1c value of 6.5% or greater indicates that they may have  diabetes and this should be confirmed with a follow-up  test. . For someone with known diabetes, a value <7% indicates  that their diabetes is well controlled and a value  greater than or equal to 7% indicates suboptimal  control. A1c targets should be individualized based on  duration of diabetes, age, comorbid conditions, and  other considerations. . Currently, no  consensus exists regarding use of hemoglobin A1c for diagnosis of diabetes for children. [name] - Valid encounter within last 6 months    Recent Outpatient Visits           6 days ago Acute non-recurrent frontal sinusitis   [name], [name], DO   1 month ago Confusion   [name], [name], DO   3 months ago Controlled type 2 diabetes mellitus with diabetic nephropathy, with long-term current use of insulin [facility])   [place] [facility] [place], [name], DO   4 months ago Controlled type 2 diabetes mellitus with diabetic nephropathy, with long-term current use of insulin [facility])   [facility] [place], [name], DO   6 months ago Influenza A   [name], DO       Future Appointments             In 3 weeks [name], [name], [name]   In 1  month End, [name], [name]

## 2022-09-16 ENCOUNTER — Telehealth: Payer: Self-pay | Admitting: Family Medicine

## 2022-09-16 DIAGNOSIS — Z794 Long term (current) use of insulin: Secondary | ICD-10-CM

## 2022-09-16 DIAGNOSIS — I5022 Chronic systolic (congestive) heart failure: Secondary | ICD-10-CM

## 2022-09-16 MED ORDER — JARDIANCE 10 MG PO TABS
10.0000 mg | ORAL_TABLET | Freq: Every day | ORAL | 5 refills | Status: AC
Start: 2022-09-16 — End: ?

## 2022-09-16 NOTE — Telephone Encounter (Signed)
Medication Refill - Medication: JARDIANCE 10MG  TABLETS] [[id number]]   Has the patient contacted their pharmacy? Yes.    (Agent: If yes, when and what did the pharmacy advise?) Contact PCP   Preferred Pharmacy (with phone number or street name): [id number] - [place], [place] - [address] AT [facility] OF [address]   Has the patient been seen for an appointment in the last year OR does the patient have an upcoming appointment? Yes.    Agent: Please be advised that RX refills may take up to 3 business days. We ask that you follow-up with your pharmacy.

## 2022-09-16 NOTE — Telephone Encounter (Signed)
Medication Refill - Medication: famotidine (PEPCID) 40 MG tablet   Has the patient contacted their pharmacy? No.  Preferred Pharmacy (with phone number or street name):   [facility] #[id number] - [place], [place] - [address] AT [facility] OF [address] [facility] Phone: [phone]  Fax: [phone]     Has the patient been seen for an appointment in the last year OR does the patient have an upcoming appointment? No.  Agent: Please be advised that RX refills may take up to 3 business days. We ask that you follow-up with your pharmacy.

## 2022-09-16 NOTE — Telephone Encounter (Signed)
[facility] called and spoke to [employer] about the refill(s) Famotidine 40mg   requested. Advised it was sent on 09/10/22 #180/1 refill(s). [facility] stated that the pharmacy is in the process of filling the requested medication at this time and will notify the patient when it is ready for pickup today.

## 2022-09-16 NOTE — Telephone Encounter (Signed)
Ordered Jardiance 10mg   [name], DO [facility] 09/16/2022, 12:02 PM

## 2022-09-23 DIAGNOSIS — H353221 Exudative age-related macular degeneration, left eye, with active choroidal neovascularization: Secondary | ICD-10-CM | POA: Diagnosis not present

## 2022-09-24 ENCOUNTER — Encounter (INDEPENDENT_AMBULATORY_CARE_PROVIDER_SITE_OTHER): Payer: Medicare HMO

## 2022-09-26 ENCOUNTER — Ambulatory Visit: Admit: 2022-09-26 | Payer: MEDICARE

## 2022-09-26 ENCOUNTER — Ambulatory Visit: Admit: 2022-09-26 | Payer: MEDICARE | Attending: Hematology & Oncology | Primary: Hematology & Oncology

## 2022-09-30 ENCOUNTER — Other Ambulatory Visit: Payer: Self-pay | Admitting: Family Medicine

## 2022-09-30 ENCOUNTER — Telehealth: Payer: Self-pay | Admitting: Internal Medicine

## 2022-09-30 NOTE — Telephone Encounter (Signed)
Pt c/o medication issue:  1. Name of Medication: Isosorbide  2. How are you currently taking this medication (dosage and times per day)?  Takes 0/5 tablet by mouth daily.  3. Are you having a reaction (difficulty breathing--STAT)? No   4. What is your medication issue? Pt was requesting refill but was no longer listed on the medications list.

## 2022-09-30 NOTE — Telephone Encounter (Signed)
Spoke with patients wife, Informed her that isosorbide is not currently listed on his medication list and no mention of this medication in the last OV note.  Please advise if patient I supposed to take this medication.

## 2022-09-30 NOTE — Telephone Encounter (Signed)
[name] called and advised this medication was stopped at discharge from the hospital and the last time it was refilled by Dr. Okey Dupre, so she will need to call the cardiologist office to request the refill. She asks could we just call Dr. Okey Dupre and have him to send it to [facility]. Advised to either call Dr. Serita Kyle office for them to send the refill to [facility] or call [facility] for them to fax a request to Dr. Okey Dupre for refill. She said he said he will refill the medications. So I advised to call Dr. Serita Kyle office. She said she will call and speak to the nurse to have the prescription sent to [facility].

## 2022-09-30 NOTE — Telephone Encounter (Signed)
Medication Refill - Medication:  ISOSORBIDE MONONITRATE 60 MG  Pt wife states that he is completely out and wanting to see if she can get a refill today.    Has the patient contacted their pharmacy? No.   Preferred Pharmacy (with phone number or street name): [facility] #[id number] - [place], [place] - [address] AT [facility] OF [address] [facility]  Phone: [phone] Fax: [phone]  Has the patient been seen for an appointment in the last year OR does the patient have an upcoming appointment? Yes.    Agent: Please be advised that RX refills may take up to 3 business days. We ask that you follow-up with your pharmacy.

## 2022-10-01 NOTE — Telephone Encounter (Signed)
I believe this was held at one point in the last few months due to soft blood pressure.  [name] is scheduled to see Dr. Gala Romney tomorrow, at which time his blood pressure can be rechecked and determination made regarding reinitiation of isosorbide mononitrate.  Yvonne Kendall, MD [facility]

## 2022-10-01 NOTE — Telephone Encounter (Signed)
Patients wife advised of providers recommendations and verbalized understanding. No further questions at this time. They will follow up with Dr. Gala Romney tomorrow regarding reinitiating isosorbide.

## 2022-10-02 ENCOUNTER — Other Ambulatory Visit
Admission: RE | Admit: 2022-10-02 | Discharge: 2022-10-02 | Disposition: A | Discharge status: Home / Self Care | Source: Ambulatory Visit | Attending: Internal Medicine | Admitting: Internal Medicine

## 2022-10-02 ENCOUNTER — Encounter: Payer: Medicare HMO | Admitting: Internal Medicine

## 2022-10-02 ENCOUNTER — Ambulatory Visit: Payer: Medicare HMO | Admitting: Family Medicine

## 2022-10-02 ENCOUNTER — Ambulatory Visit: Payer: Medicare HMO | Attending: Internal Medicine

## 2022-10-02 ENCOUNTER — Ambulatory Visit (HOSPITAL_BASED_OUTPATIENT_CLINIC_OR_DEPARTMENT_OTHER): Admitting: Internal Medicine

## 2022-10-02 VITALS — BP 102/72 | HR 88 | Ht 64.0 in | Wt 189.0 lb

## 2022-10-02 DIAGNOSIS — I5022 Chronic systolic (congestive) heart failure: Secondary | ICD-10-CM | POA: Insufficient documentation

## 2022-10-02 DIAGNOSIS — N183 Chronic kidney disease, stage 3 unspecified: Secondary | ICD-10-CM

## 2022-10-02 DIAGNOSIS — I25118 Atherosclerotic heart disease of native coronary artery with other forms of angina pectoris: Secondary | ICD-10-CM

## 2022-10-02 DIAGNOSIS — I480 Paroxysmal atrial fibrillation: Secondary | ICD-10-CM

## 2022-10-02 DIAGNOSIS — Z7901 Long term (current) use of anticoagulants: Secondary | ICD-10-CM | POA: Diagnosis not present

## 2022-10-02 LAB — BASIC METABOLIC PANEL
Anion gap: 11 (ref 5–15)
BUN: 42 mg/dL — ABNORMAL HIGH (ref 8–23)
CO2: 24 mmol/L (ref 22–32)
Calcium: 9.5 mg/dL (ref 8.9–10.3)
Chloride: 99 mmol/L (ref 98–111)
Creatinine, Ser: 1.67 mg/dL — ABNORMAL HIGH (ref 0.61–1.24)
GFR, Estimated: 40 mL/min — ABNORMAL LOW (ref >60)
Glucose, Bld: 152 mg/dL — ABNORMAL HIGH (ref 70–99)
Potassium: 4.4 mmol/L (ref 3.5–5.1)
Sodium: 134 mmol/L — ABNORMAL LOW (ref 135–145)

## 2022-10-02 LAB — POCT INR: INR: 3.2 — AB (ref 2.0–3.0)

## 2022-10-02 LAB — BRAIN NATRIURETIC PEPTIDE: B Natriuretic Peptide: 990.8 pg/mL — ABNORMAL HIGH (ref 0.0–100.0)

## 2022-10-02 NOTE — Patient Instructions (Signed)
Lab Work:  Labs done today, your results will be available in MyChart, we will contact you for abnormal readings.   Special Instructions // Education:  Do the following things EVERYDAY: Weigh yourself in the morning before breakfast. Write it down and keep it in a log. Take your medicines as prescribed Eat low salt foods--Limit salt (sodium) to 2000 mg per day.  Stay as active as you can everyday Limit all fluids for the day to less than 2 liters   Follow-Up in: please follow up in 2 months with Dr. Gala Romney.    If you have any questions or concerns before your next appointment please send [place] a message through [facility] Hernandez or call our office at [phone] Monday-Friday 8 am-5 pm.   If you have an urgent need after hours on the weekend please call your Primary Cardiologist or the [facility] in [place] at [phone].

## 2022-10-02 NOTE — Progress Notes (Signed)
Advanced Heart Failure Clinic Note   Date:  10/02/2022   ID:  Lucas Caldwell, DOB 10/31/1933, MRN [id number]  Location: Home  Provider location:  Advanced Heart Failure Type of Visit: Established patient   PCP:  Smitty Cords, DO  Cardiologist:  Yvonne Kendall, MD HF Cardiologist: Dr. Gala Romney  Chief Complaint:  No chief complaint on file.  Heart failure follow up   HPI: Lucas Caldwell is a 89 y.o. 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 Okey Dupre for further evaluation of his HF.     Has h/o NICM previously followed by Dr. [name] at [facility]. He had good response to medical therapy in 2012 EF was reported to be 50%    Had PCI of his RCA CAD s/p RCA stent x2 08/2016 at [facility].    Echo 6/20 LVEF 35-40%   Cardiac cath 08/2018 70% LM stenosis with abnl FFR of 0.86, though drop in iFR was due to diffuse prox LAD disease and only small gradient involving ostial LM. Imdur was increased.    Admitted 04/13/19 with chest pain and A/C systolic heart failure. Repeat Cath 1/21 LM 60% LAD 70% with +FFR otherwise diffuse non-obstructive CAD -> PCI/DES LAD 1/21. RHC: RA 10 PA 53/30 (40) PCWP 28 CO/CI 4.2/2.1 PA sat 67%.   Echo 04/22/19 EF 30-35%   Referred to [facility] 04/2019 for further management of his systolic HF. Had cardiomems implanted in 7/22   Zio 4/21 SR w/ 1st AVB, avg HR 81 bpm, 1 brief 5 beat run of NSVT, Two runs SVT   Echo 8/22 EF 30-35% RV ok   Echo 1/23 EF 30-35%  Earlier this month PAD elevated on Cardiomems and torsemide increased for 2 days from 80 bid to 100 bid. But no change   Underwent R/L cath in 5/24 for refractory HF. Found to have severe distal LMCA disease extending into the ostial LAD and LCx with elevated left and right heart filling pressures and low cardiac output (PCWP 34, RA 14,  Fick CO/CI 4.1/2.1). Not felt to be candidate for PCI or surgery. Developed low output symptoms and severe encephalopathy. Palliative Care consulted and discharged home with Hospice services   Ao = 101/58 (76) LV = 107/28 RA = 14 RV = 59/16 PA = 59/29 (39) PCW = 34 Fick cardiac output/index = 4.1/2.1 PVR = 1.2 WU Ao sat = 97% PA sat = 60%, 61%  He returns with his wife. He has been doing great. Denies CP. Gets around the house without significant dyspnea. Denies edema, orthopnea or PND. Compliant with meds. Wife says they are keeping the salt away from him. Still with Hospice team coming to the house.    Past Medical History:  Diagnosis Date   Arthritis    "probably in his legs" (08/15/2016)   Blind right eye    BPH (benign prostatic hyperplasia)    Breast asymmetry    Left breast is larger, present for several years.   CAD (coronary artery disease)    a. Cath in the late 90's - reportedly ok;  b. 2014 s/p stenting x 2 @ [facility]; c 08/19/16 Cath/PCI with DES -> RCA, plan to treat LM 70% medically. Seen by surgery and felt to be too high risk for CABG; d. 08/2018 Cath: LM 70 (iFR 0.86), LAD 20ost, 40p,  50/13m, D1 70, LCX patent stent, OM1 30, RCA patent stent, 12m ISR, 40d, RPDA 30. PCWP 8. CO/CI 4.0/2.1; e. 03/2019 PCI to LAD (2.5x15 Resolute Onyx DES).   Chronic combined systolic (congestive) and diastolic (congestive) heart failure (HCC)    a. Previously reduced EF-->50% by echo in 2012;  b. 06/2015 Echo: EF 50-55%  c. 07/2016 Echo: EF 45-50%; d. 12/2017 Echo: EF 55%; e. 08/2018 Echo: EF 35-40%; f. 04/2019 Echo: EF 30-35%; g. s/p cardiomems; h. 10/2020 Echo: EF 30-35%; i. 01/2022 Echo: EF 30-35%, glob HK, GrII DD, nl rV fxn, mild to mod MR.   Chronic kidney disease (CKD), stage IV (severe) (HCC)    CML (chronic myelocytic leukemia) (HCC)    GERD (gastroesophageal reflux disease)    GIB (gastrointestinal bleeding)    a. 12/2017 3 unit PRBC GIB in setting of coumadin-->Endo/colon multiple duodenal  ulcers and a single bleeding ulcer in the proximal ascending colon status post hemostatic clipping x2.   Gout    Hyperlipemia    Hypertension    Hypothyroidism    Iron deficiency anemia    Ischemic cardiomyopathy    a. Previously reduced EF-->50% by echo in 2012;  b. 06/2015 Echo: EF 50-55%, Gr2 DD;  c. 07/2016 Echo: EF 45-50%; d. 12/2017 Echo: EF 55%, Gr1 DD, mild MR; e. 08/2018 Echo: EF 35-40%; f. 04/2019 Echo: EF 30-35%; g. 10/2020 Echo: EF 30-35%; h. Echo: EF 30-35%   Migraine    "in the 1960s" (08/15/2016)   Obstructive sleep apnea    Orthostatic hypotension    a. 20222 -->carvedilol and hydralazine d/c'd.   PAF (paroxysmal atrial fibrillation) (HCC)    a. ? Dx 2014-->s/p DCCV;  b. CHA2DS2VASc = 6--> Coumadin.   Prostate cancer (HCC)    RBBB    Type II diabetes mellitus [facility]    Past Surgical History:  Procedure Laterality Date   AMPUTATION TOE Right 02/23/2021   Procedure: AMPUTATION TOE;  Surgeon: Gwyneth Revels, DPM;  Location: [facility] ORS;  Service: Podiatry;  Laterality: Right;   CATARACT EXTRACTION W/ INTRAOCULAR LENS  IMPLANT, BILATERAL Bilateral    COLONOSCOPY N/A 01/13/2018   Procedure: COLONOSCOPY;  Surgeon: Toney Reil, MD;  Location: [facility] ENDOSCOPY;  Service: Gastroenterology;  Laterality: N/A;   COLONOSCOPY WITH PROPOFOL N/A 01/01/2018   Procedure: COLONOSCOPY WITH PROPOFOL;  Surgeon: Toney Reil, MD;  Location: [facility] ENDOSCOPY;  Service: Gastroenterology;  Laterality: N/A;   CORONARY ANGIOPLASTY WITH STENT PLACEMENT  09/2012   2 stents   CORONARY STENT INTERVENTION N/A 08/19/2016   Procedure: Coronary Stent Intervention;  Surgeon: Yvonne Kendall, MD;  Location: [facility] INVASIVE CV LAB;  Service: Cardiovascular;  Laterality: N/A;   CORONARY STENT INTERVENTION N/A 04/13/2019   Procedure: CORONARY STENT INTERVENTION;  Surgeon: Yvonne Kendall, MD;  Location: [facility] INVASIVE CV LAB;  Service: Cardiovascular;  Laterality: N/A;   ESOPHAGEAL MANOMETRY N/A 12/16/2017    Procedure: ESOPHAGEAL MANOMETRY (EM);  Surgeon: Toney Reil, MD;  Location: [facility] ENDOSCOPY;  Service: Gastroenterology;  Laterality: N/A;   ESOPHAGOGASTRODUODENOSCOPY N/A 12/20/2016   Procedure: ESOPHAGOGASTRODUODENOSCOPY (EGD);  Surgeon: Toney Reil, MD;  Location: [facility] ENDOSCOPY;  Service: Gastroenterology;  Laterality: N/A;   ESOPHAGOGASTRODUODENOSCOPY N/A 01/13/2018   Procedure: ESOPHAGOGASTRODUODENOSCOPY (EGD);  Surgeon: Toney Reil, MD;  Location: [facility] ENDOSCOPY;  Service: Gastroenterology;  Laterality: N/A;   ESOPHAGOGASTRODUODENOSCOPY (EGD) WITH PROPOFOL N/A 11/03/2017   Procedure: ESOPHAGOGASTRODUODENOSCOPY (EGD) WITH PROPOFOL;  Surgeon: Toney Reil, MD;  Location: [facility] ENDOSCOPY;  Service: Gastroenterology;  Laterality: N/A;  EYE SURGERY     HERNIA REPAIR     "navel"   LEFT HEART CATH AND CORONARY ANGIOGRAPHY N/A 08/14/2016   Procedure: Left Heart Cath and Coronary Angiography;  Surgeon: Yvonne Kendall, MD;  Location: [facility] INVASIVE CV LAB;  Service: Cardiovascular;  Laterality: N/A;   PRESSURE SENSOR/CARDIOMEMS N/A 09/23/2019   Procedure: PRESSURE SENSOR/CARDIOMEMS;  Surgeon: Dolores Patty, MD;  Location: [facility] INVASIVE CV LAB;  Service: Cardiovascular;  Laterality: N/A;   RIGHT HEART CATH N/A 09/23/2019   Procedure: RIGHT HEART CATH;  Surgeon: Dolores Patty, MD;  Location: [facility] INVASIVE CV LAB;  Service: Cardiovascular;  Laterality: N/A;   RIGHT/LEFT HEART CATH AND CORONARY ANGIOGRAPHY N/A 09/01/2018   Procedure: RIGHT/LEFT HEART CATH AND CORONARY ANGIOGRAPHY;  Surgeon: Yvonne Kendall, MD;  Location: [facility] INVASIVE CV LAB;  Service: Cardiovascular;  Laterality: N/A;   RIGHT/LEFT HEART CATH AND CORONARY ANGIOGRAPHY N/A 04/13/2019   Procedure: RIGHT/LEFT HEART CATH AND CORONARY ANGIOGRAPHY;  Surgeon: Yvonne Kendall, MD;  Location: [facility] INVASIVE CV LAB;  Service: Cardiovascular;  Laterality: N/A;   RIGHT/LEFT HEART CATH AND CORONARY ANGIOGRAPHY N/A  08/09/2022   Procedure: RIGHT/LEFT HEART CATH AND CORONARY ANGIOGRAPHY;  Surgeon: Dolores Patty, MD;  Location: [facility]VASIVE CV LAB;  Service: Cardiovascular;  Laterality: N/A;   TOE AMPUTATION Right    "big toe"   Current Outpatient Medications  Medication Sig Dispense Refill   acetaminophen (TYLENOL) 500 MG tablet Take 1,000 mg by mouth 2 (two) times daily as needed for moderate pain or headache.     allopurinol (ZYLOPRIM) 100 MG tablet TAKE 1 TABLET(100 MG) BY MOUTH DAILY (Patient taking differently: Take 100 mg by mouth every evening.) 90 tablet 0   aspirin 81 MG chewable tablet Chew 81 mg by mouth in the morning.     famotidine (PEPCID) 40 MG tablet Take 1 tablet (40 mg total) by mouth 2 (two) times daily before a meal. 180 tablet 1   gabapentin (NEURONTIN) 600 MG tablet Take 600 mg by mouth 2 (two) times daily.     Insulin Glargine ([name] KWIKPEN) 100 UNIT/ML Inject 22 Units into the skin daily. (Patient taking differently: Inject 22 Units into the skin at bedtime.) 6 mL 0   JARDIANCE 10 MG TABS tablet Take 1 tablet (10 mg total) by mouth daily before breakfast. 30 tablet 5   levothyroxine (SYNTHROID) 25 MCG tablet TAKE 1 TABLET BY MOUTH EVERY MORNING BEFORE BREAKFAST 90 tablet 2   Polyethyl Glycol-Propyl Glycol (SYSTANE OP) Place 1 drop into both eyes 3 (three) times daily as needed (dry/irritated eyes.).      polyethylene glycol (MIRALAX / GLYCOLAX) 17 g packet Take 17 g by mouth daily as needed for mild constipation.      ranolazine (RANEXA) 500 MG 12 hr tablet Take 1 tablet (500 mg total) by mouth 2 (two) times daily. 180 tablet 2   rosuvastatin (CRESTOR) 5 MG tablet Take 1 tablet (5 mg total) by mouth daily. 90 tablet 0   tamsulosin (FLOMAX) 0.4 MG CAPS capsule Take 1 capsule (0.4 mg total) by mouth daily after supper. 30 capsule 11   torsemide (DEMADEX) 20 MG tablet Take 4 tablets (80 mg) in the morning and take 2 tablets (40 mg) in the evening. 180 tablet 0   traMADol  (ULTRAM) 50 MG tablet Take 1-2 tablets (50-100 mg total) by mouth at bedtime as needed. (Patient taking differently: Take 50-100 mg by mouth at bedtime as needed (pain.).) 30 tablet 2   warfarin (COUMADIN)  3 MG tablet Take 1 to 1 and 1/2 tablets by mouth daily or as directed by Coumadin Clinic 45 tablet 1   Alcohol Swabs (B-D SINGLE USE SWABS REGULAR) PADS Use to check blood sugar up to 2 times daily 200 each 3   amoxicillin-clavulanate (AUGMENTIN) 875-125 MG tablet Take 1 tablet by mouth 2 (two) times daily. (Patient not taking: Reported on 10/02/2022) 20 tablet 0   Fluticasone-Umeclidin-Vilant (TRELEGY ELLIPTA) 100-62.5-25 MCG/ACT AEPB Inhale 1 puff into the lungs daily. (Patient not taking: Reported on 10/02/2022) 28 each 0   Menthol-Zinc Oxide (GOLD BOND EX) Apply 1 application topically daily as needed (muscle pain). (Patient not taking: Reported on 10/02/2022)     nitroGLYCERIN (NITROSTAT) 0.4 MG SL tablet For chest pain, tightness, or pressure. While sitting, place 1 tablet under tongue. May be used every 5 minutes as needed, for up to 15 minutes. Do not use more than 3 tablets. (Patient not taking: Reported on 10/02/2022) 25 tablet 1   QUEtiapine (SEROQUEL) 25 MG tablet Take 1 tablet (25 mg total) by mouth at bedtime. (Patient not taking: Reported on 10/02/2022) 30 tablet 0   No current facility-administered medications for this visit.    Allergies:   Clopidogrel, Ciprofloxacin, Beta adrenergic blockers, Mirtazapine, and Spironolactone   Social History:  The patient  reports that he quit smoking about 49 years ago. His smoking use included cigarettes. He started smoking about 69 years ago. He has a 35 pack-year smoking history. He has quit using smokeless tobacco. He reports that he does not drink alcohol and does not use drugs.   Family History:  The patient's family history includes Brain cancer in his father; Diabetes in his sister; Heart attack in his sister.   ROS:  Please see the history  of present illness.   All other systems are personally reviewed and negative.   BP 130/74 (BP Location: Right Arm, Patient Position: Sitting)   Pulse 88   Resp 16   Ht 5\' 4"  (1.626 m)   Wt 193 lb 4 oz (87.7 kg)   SpO2 99%   BMI 33.17 kg/m   Exam:   General:  Elderly. No resp difficulty HEENT: normal Neck: supple. no JVD. Carotids 2+ bilat; no bruits. No lymphadenopathy or thryomegaly appreciated. Cor: Regular rate & rhythm. No rubs, gallops or murmurs. Lungs: clear Abdomen: soft, nontender, nondistended. No hepatosplenomegaly. No bruits or masses. Good bowel sounds. Extremities: no cyanosis, clubbing, rash, edema Neuro: alert & orientedx3, cranial nerves grossly intact. moves all 4 extremities w/o difficulty. Affect pleasant   02/05/2022: TSH 3.204 08/07/2022: B Natriuretic Peptide 710.9 08/11/2022: ALT 23; Hemoglobin 13.2; Platelets 197 08/13/2022: BUN 27; Creatinine, Ser 1.84; Magnesium 2.4; Potassium 3.6; Sodium 134   Wt Readings from Last 3 Encounters:  10/02/22 189 lb (85.7 kg)  09/04/22 192 lb (87.1 kg)  08/21/22 188 lb 3.2 oz (85.4 kg)     ASSESSMENT AND PLAN:  1. Chronic systolic HF: - likely combination of iCM/NICM - Echo (2/21): EF 30-35%  (was 35-40% in 6/20) - RHC (04/13/19): with elevated filling pressures but BNP normal  - Echo (8/22): EF 30-35%, grade I DD, RV low normal - Echo 11/23 EF 30-35% - Underwent R/L cath in 5/24 for refractory HF. Found to have severe distal LMCA disease extending into the ostial LAD and LCx with elevated left and right heart filling pressures and low cardiac output (PCWP 34, RA 14, Fick CO/CI 4.1/2.1). Discharged home with Hospice services - Amazingly, he is much improved  today. NYHA II. Volume status looks the best I have seen it in a year (Not using Cardiomems) - Off Toprol due to low output - Continue Jardiance 10 mg daily - Would continue Palliative services for now until he can prove stabilization.   2. CAD - Cath 04/13/19 LAD  70% with +FFR otherwise diffuse non-obstructive CAD  - S/p PCI/DES to LAD 1/21 - Underwent R/L cath in 5/24 for refractory HF. Found to have severe distal LMCA disease extending into the ostial LAD and LCx. Reviewed with IC. No revascularization options.  - Denies angina - Continue medical management.  3. CKD Stage 3b - baseline SCr ~1.7-2.0 - labs today  4. DM  - Continue Jardiance 10 mg daily. - No GU symptoms  5. PAF - Zio patch 4/21 showed no AF. Mostly NSR w/ brief SVT/ NSVT, PACs and PVCs - Remains inNSR - Continue warfarin - No bleeding   Arvilla Meres, MD  10:21 AM   [facility] [address] Heart and Vascular [place] [address] [phone] (office) [phone] (fax)

## 2022-10-02 NOTE — Patient Instructions (Signed)
 Continue dosage of warfarin of 1.5 TABLETS EVERY DAY EXCEPT 1 TABLET ON MONDAYS & FRIDAYS.  Recheck INR in 6 weeks

## 2022-10-21 ENCOUNTER — Ambulatory Visit: Payer: Self-pay | Admitting: *Deleted

## 2022-10-21 NOTE — Telephone Encounter (Signed)
Advised Ms. Toft that Dr. [name] is out of the office this week.  She stated they would like to wait until they see him.  (Afternoon appointment). Mr. Artis is scheduled for 11/11/2022.   Thanks,   -Vernona Rieger

## 2022-10-21 NOTE — Telephone Encounter (Signed)
Summary: Ear pain, clogged   The patient called in stating he has been having left ear pain. He says he also states he cannot hear out of good either. Please assist patient further       Chief Complaint: Ear "Clogged" Symptoms: Left ear clogged "Sounds like a train roaring through." Frequency: 1 week Pertinent Negatives: Patient denies pain, drainage, no cold symptoms. Disposition: [] ED /[] Urgent Care (no appt availability in office) / [] Appointment(In office/virtual)/ []  [place] [facility]/ [] Home Care/ [] Refused Recommended Disposition /[] [place] [facility]/ [x]  Follow-up with PCP Additional Notes: Wife states often has wax build up and needs to be removed "The doctor always does it,  the OTC stuff I don't do, never helps." No availability until next week. Pt called during practice's lunch break. Assured NT would route to practice for PCPs review and final disposition. Advised may need UC visit. Advised UC for worsening symptoms. Verbalizes understanding. Please advise  CB# [phone]  Reason for Disposition  Ear congestion present > 48 hours  Answer Assessment - Initial Assessment Questions 1. LOCATION: "Which ear is involved?"       Left 2. SENSATION: "Describe how the ear feels." (e.g. stuffy, full, plugged)."      "Clogged" Feels like "A train going by." 3. ONSET:  "When did the ear symptoms start?"       1 week 4. PAIN: "Do you also have an earache?" If Yes, ask: "How bad is it?" (Scale 1-10; or mild, moderate, severe)     No 5. CAUSE: "What do you think is causing the ear congestion?"     Unsure 6. URI: "Do you have a runny nose or cough?"      No 7. NASAL ALLERGIES: "Are there symptoms of hay fever, such as sneezing or a clear nasal discharge?"     No  Protocols used: Ear - Congestion-A-AH

## 2022-10-22 NOTE — Telephone Encounter (Signed)
noted 

## 2022-10-28 ENCOUNTER — Ambulatory Visit: Payer: Self-pay

## 2022-10-28 NOTE — Telephone Encounter (Signed)
  Chief Complaint: fell this am  Symptoms: left chest soreness and right shoulder pain HR 103 BP 100/60 Frequency: fell this am  Pertinent Negatives: Patient denies diff breathing just pain woith breathing Disposition: [] ED /[] Urgent Care (no appt availability in office) / [] Appointment(In office/virtual)/ []  [place] [facility]/ [] Home Care/ [] Refused Recommended Disposition /[]  Mobile Bus/ []  Follow-up with PCP Additional Notes: pt is in Hospice Care. Advised ED but pt refused. Please call Amy [facility] RN to advise on pain medication and what BP meds to hold . See number in contact section. Attempted to call FC at office and no answer. Sent Teams message. No pain medication at home. No umltram though is on his med list will need new order. Reason for Disposition  Sounds like a serious injury to the triager    Low BP and dizziness  Answer Assessment - Initial Assessment Questions 1. MECHANISM: "How did the fall happen?"     Fell against bed and fell  2. DOMESTIC VIOLENCE AND ELDER ABUSE SCREENING: "Did you fall because someone pushed you or tried to hurt you?" If Yes, ask: "Are you safe now?"     no 3. ONSET: "When did the fall happen?" (e.g., minutes, hours, or days ago)     dizziness 4. LOCATION: "What part of the body hit the ground?" (e.g., back, buttocks, head, hips, knees, hands, head, stomach)     Chest  5. INJURY: "Did you hurt (injure) yourself when you fell?" If Yes, ask: "What did you injure? Tell me more about this?" (e.g., body area; type of injury; pain severity)"     Left side  6. PAIN: "Is there any pain?" If Yes, ask: "How bad is the pain?" (e.g., Scale 1-10; or mild,  moderate, severe)   - NONE (0): No pain   - MILD (1-3): Doesn't interfere with normal activities    - MODERATE (4-7): Interferes with normal activities or awakens from sleep    - SEVERE (8-10): Excruciating pain, unable to do any normal activities      *No Answer* 7. SIZE: For cuts, bruises, or  swelling, ask: "How large is it?" (e.g., inches or centimeters)      N/a 9. OTHER SYMPTOMS: "Do you have any other symptoms?" (e.g., dizziness, fever, weakness; new onset or worsening).      Dizziness, chest hurts with breathing 10. CAUSE: "What do you think caused the fall (or falling)?" (e.g., tripped, dizzy spell)       Dizzy spell HR 83 HR 105 BP 100/60  Protocols used: Falls and [facility]

## 2022-10-29 NOTE — Telephone Encounter (Signed)
Spoke with [facility] nurse

## 2022-10-29 NOTE — Telephone Encounter (Signed)
Patient has reported low BP and asking about which med to hold. [name] not on any blood pressure med except his fluid pill. That would mostly be a decision based on his swelling / fluid level and his Cardiology team.  If his weight is overall down and he is not retaining fluid, he may need to dial back fluid pill Torsemide but I would request he discuss with his Cardiologist first.  He has the Tramadol as needed for pain. I can re order if he is out or running low.  Otherwise, if they are worried about other injuries from fall - should seek care hospital or even urgent care for x-ray if need acutely.   [name], DO [facility] 10/29/2022, 11:49 AM

## 2022-11-04 ENCOUNTER — Telehealth: Payer: Self-pay | Admitting: Internal Medicine

## 2022-11-04 ENCOUNTER — Other Ambulatory Visit: Payer: Self-pay | Admitting: Nurse Practitioner

## 2022-11-04 ENCOUNTER — Other Ambulatory Visit: Payer: Self-pay | Admitting: Family Medicine

## 2022-11-04 DIAGNOSIS — I5023 Acute on chronic systolic (congestive) heart failure: Secondary | ICD-10-CM

## 2022-11-04 NOTE — Telephone Encounter (Signed)
Pt c/o medication issue:  1. Name of Medication:   torsemide (DEMADEX) 20 MG tablet    2. How are you currently taking this medication (dosage and times per day)?    3. Are you having a reaction (difficulty breathing--STAT)? no  4. What is your medication issue? Calling question cornering medication. Please advise

## 2022-11-04 NOTE — Telephone Encounter (Signed)
Last filled by Nicolasa Ducking on 08/02/22.  The 08/01/22 refill request encounter Thayer Ohm approved to continue refilling medication.  Patient has f/u on 11/08/22 and can discuss further refill approval at that visit.

## 2022-11-05 ENCOUNTER — Telehealth: Payer: Self-pay

## 2022-11-05 NOTE — Telephone Encounter (Signed)
Attempted to call pt no answer. Will attempt to call back again.   Bensimhon, Bevelyn Buckles, MD  Yes. Would continue current diuretics he is taking. May want to confirm with him that he is taking that dose    To: Dolores Patty, MD  Good morning Dr. Gala Romney,  [facility]  is questioning if the patient should continue:  order of torsemide (DEMADEX) 20 MG tablet Take 4 tablets (80 mg) in the morning and take 2 tablets (40 mg) in the evening  given patients current condition.

## 2022-11-06 NOTE — Telephone Encounter (Signed)
Requested medication (s) are due for refill today: Yes  Requested medication (s) are on the active medication list: Yes  Last refill:  Allopurinol 11/04/22, #30 0RF, Torsemide 08/13/22 #180, 0RF  Future visit scheduled: Yes  Notes to clinic:  Unable to refill per protocol, last refill by another provider.      Requested Prescriptions  Pending Prescriptions Disp Refills   torsemide (DEMADEX) 20 MG tablet [Pharmacy Med Name: TORSEMIDE 20MG  TABLETS] 540 tablet     Sig: TAKE 4 TABLET BY MOUTH IN THE MORNING AND 2 IN THE EVENING(TOTAL 6 DAILY)     Cardiovascular:  Diuretics - Loop Failed - 11/04/2022  1:15 PM      Failed - Na in normal range and within 180 days    Sodium  Date Value Ref Range Status  10/02/2022 134 (L) 135 - 145 mmol/L Final  05/16/2021 138 134 - 144 mmol/L Final  05/18/2012 134 (L) 136 - 145 mmol/L Final         Failed - Cr in normal range and within 180 days    Creat  Date Value Ref Range Status  12/05/2021 1.96 (H) 0.70 - 1.22 mg/dL Final   Creatinine, Ser  Date Value Ref Range Status  10/02/2022 1.67 (H) 0.61 - 1.24 mg/dL Final   Creatinine, Urine  Date Value Ref Range Status  12/18/2016 96 mg/dL Final         Passed - K in normal range and within 180 days    Potassium  Date Value Ref Range Status  10/02/2022 4.4 3.5 - 5.1 mmol/L Final  05/18/2012 4.2 3.5 - 5.1 mmol/L Final         Passed - Ca in normal range and within 180 days    Calcium  Date Value Ref Range Status  10/02/2022 9.5 8.9 - 10.3 mg/dL Final   Calcium, Total  Date Value Ref Range Status  05/18/2012 8.4 (L) 8.5 - 10.1 mg/dL Final   Calcium, Ion  Date Value Ref Range Status  08/09/2022 1.08 (L) 1.15 - 1.40 mmol/L Final  08/09/2022 1.15 1.15 - 1.40 mmol/L Final         Passed - Cl in normal range and within 180 days    Chloride  Date Value Ref Range Status  10/02/2022 99 98 - 111 mmol/L Final  05/18/2012 103 98 - 107 mmol/L Final         Passed - Mg Level in normal range  and within 180 days    Magnesium  Date Value Ref Range Status  08/13/2022 2.4 1.7 - 2.4 mg/dL Final    Comment:    Performed at [facility], [address], [place], [place] [address]         Passed - Last BP in normal range    BP Readings from Last 1 Encounters:  10/02/22 102/72         Passed - Valid encounter within last 6 months    Recent Outpatient Visits           2 months ago Acute non-recurrent frontal sinusitis   [place] [facility] [place], [name], DO   3 months ago Confusion   [place] [facility] Smitty Cords, DO   5 months ago Controlled type 2 diabetes mellitus with diabetic nephropathy, with long-term current use of insulin [facility])   [place] [facility] Smitty Cords, DO   6 months ago Controlled type 2  diabetes mellitus with diabetic nephropathy, with long-term current use of insulin (HCC)   [place] [facility] [place], [name], DO   8 months ago Influenza A   [place] [facility] Smitty Cords, DO       Future Appointments             In 2 days End, [name], MD [facility] [place]   In 5 days [name], [name], DO [place] [name]             allopurinol (ZYLOPRIM) 100 MG tablet [Pharmacy Med Name: ALLOPURINOL 100MG  TABLETS] 90 tablet 0    Sig: TAKE 1 TABLET BY MOUTH EVERY DAY     Endocrinology:  Gout Agents - allopurinol Failed - 11/04/2022  1:15 PM      Failed - Uric Acid in normal range and within 360 days    No results found for: "POCURA", "LABURIC"       Failed - Cr in normal range and within 360 days    Creat  Date Value Ref Range Status  12/05/2021 1.96 (H) 0.70 - 1.22 mg/dL Final   Creatinine, Ser  Date Value Ref Range Status  10/02/2022 1.67 (H) 0.61 - 1.24 mg/dL Final   Creatinine, Urine  Date Value Ref Range Status   12/18/2016 96 mg/dL Final         Passed - Valid encounter within last 12 months    Recent Outpatient Visits           2 months ago Acute non-recurrent frontal sinusitis   [place] [facility] Smitty Cords, DO   3 months ago Confusion   [place] [facility] Smitty Cords, DO   5 months ago Controlled type 2 diabetes mellitus with diabetic nephropathy, with long-term current use of insulin [facility])   [place] [facility] [place], [name], DO   6 months ago Controlled type 2 diabetes mellitus with diabetic nephropathy, with long-term current use of insulin [facility])   [place] [facility] [place], [name], DO   8 months ago Influenza A   [place] [facility] [name], [name], DO       Future Appointments             In 2 days End, [name], MD [facility] [place]   In 5 days [name], [name], DO  [name]            Passed - CBC within normal limits and completed in the last 12 months    WBC  Date Value Ref Range Status  08/11/2022 9.6 4.0 - 10.5 K/uL Final   RBC  Date Value Ref Range Status  08/11/2022 4.68 4.22 - 5.81 MIL/uL Final   Hemoglobin  Date Value Ref Range Status  08/11/2022 13.2 13.0 - 17.0 g/dL Final  16/12/9602 54.0 13.0 - 17.7 g/dL Final   HCT  Date Value Ref Range Status  08/11/2022 39.7 39.0 - 52.0 % Final   Hematocrit  Date Value Ref Range Status  06/29/2020 39.4 37.5 - 51.0 % Final   MCHC  Date Value Ref Range Status  08/11/2022 33.2 30.0 - 36.0 g/dL Final   [facility]  Date Value Ref Range Status  08/11/2022 28.2 26.0 - 34.0 pg Final   MCV  Date Value Ref Range Status  08/11/2022  84.8 80.0 - 100.0 fL Final  06/29/2020 84 79 - 97 fL Final  05/18/2012 92 80 - 100 fL Final   No results found for: "PLTCOUNTKUC", "LABPLAT", "POCPLA" RDW  Date Value  Ref Range Status  08/11/2022 14.7 11.5 - 15.5 % Final  06/29/2020 15.6 (H) 11.6 - 15.4 % Final  05/18/2012 15.3 (H) 11.5 - 14.5 % Final

## 2022-11-07 ENCOUNTER — Telehealth: Payer: Self-pay

## 2022-11-07 NOTE — Telephone Encounter (Signed)
Spoke with pt's spouse to confirm pt taking current diuretics.   Adviced to continue taking current diuretics. Pt currently taking torsemide (DEMADEX) 20 MG tablet Take 4 tablets (80 mg) in the morning and take 2 tablets (40 mg) in the evening.  No further questions at this time.   Bensimhon, Bevelyn Buckles, MD  Yes. Would continue current diuretics he is taking. May want to confirm with him that he is taking that dose     To: Dolores Patty, MD  Good morning Dr. Gala Romney,  [facility]  is questioning if the patient should continue:  order of torsemide (DEMADEX) 20 MG tablet Take 4 tablets (80 mg) in the morning and take 2 tablets (40 mg) in the evening  given patients current condition.

## 2022-11-08 ENCOUNTER — Encounter: Payer: Self-pay | Admitting: Internal Medicine

## 2022-11-08 ENCOUNTER — Ambulatory Visit: Payer: Medicare HMO | Admitting: Internal Medicine

## 2022-11-08 VITALS — BP 116/76 | HR 98 | Ht 64.0 in | Wt 198.8 lb

## 2022-11-08 DIAGNOSIS — Z79899 Other long term (current) drug therapy: Secondary | ICD-10-CM | POA: Diagnosis not present

## 2022-11-08 DIAGNOSIS — N1832 Chronic kidney disease, stage 3b: Secondary | ICD-10-CM | POA: Diagnosis not present

## 2022-11-08 DIAGNOSIS — E785 Hyperlipidemia, unspecified: Secondary | ICD-10-CM | POA: Diagnosis not present

## 2022-11-08 DIAGNOSIS — I480 Paroxysmal atrial fibrillation: Secondary | ICD-10-CM | POA: Diagnosis not present

## 2022-11-08 DIAGNOSIS — I5022 Chronic systolic (congestive) heart failure: Secondary | ICD-10-CM

## 2022-11-08 DIAGNOSIS — I25118 Atherosclerotic heart disease of native coronary artery with other forms of angina pectoris: Secondary | ICD-10-CM

## 2022-11-08 NOTE — Patient Instructions (Signed)
Medication Instructions:  Your physician recommends the following medication changes.  INCREASE: Torsemide to 80 mg twice a day until your weight returns to baseline. Then resume 80 mg in the morning and 40 mg every evening.   *If you need a refill on your cardiac medications before your next appointment, please call your pharmacy*   Lab Work: Your provider would like for you to return on Wednesday 11/13/22 to have the following labs drawn: (BMP).   Please go to [facility] [address], [place] [address] You do not need an appointment.  They are open from 7:30 am-4 pm.  Lunch from 1:00 pm- 2:00 pm You will not need to be fasting.  Testing/Procedures: No test ordered today    Follow-Up: At [facility], you and your health needs are our priority.  As part of our continuing mission to provide you with exceptional heart care, we have created designated Provider Care Teams.  These Care Teams include your primary Cardiologist (physician) and Advanced Practice Providers (APPs -  Physician Assistants and Nurse Practitioners) who all work together to provide you with the care you need, when you need it.  We recommend signing up for the patient portal called "MyChart".  Sign up information is provided on this After Visit Summary.  MyChart is used to connect with patients for Virtual Visits (Telemedicine).  Patients are able to view lab/test results, encounter notes, upcoming appointments, etc.  Non-urgent messages can be sent to your provider as well.   To learn more about what you can do with MyChart, go to [email or web address].    Your next appointment:   3 month(s)  Provider:   You may see Yvonne Kendall, MD or one of the following Advanced Practice Providers on your designated Care Team:   Nicolasa Ducking, NP Eula Listen, PA-C Cadence Fransico Michael, PA-C Charlsie Quest, NP    Please return oxygen tank provided by our office.

## 2022-11-08 NOTE — Progress Notes (Unsigned)
Cardiology Office Note:  .   Date:  11/10/2022  ID:  CECIL ARNDT, DOB [birth year], MRN [id number] PCP: Smitty Cords, DO  [facility] Providers Cardiologist:  Yvonne Kendall, MD Advanced Heart Failure:  Arvilla Meres, MD     History of Present Illness: .   Lucas Caldwell is a 89 y.o. male with history of coronary artery disease status post prior RCA, circumflex, and LAD stenting, ischemic cardiomyopathy, chronic combined systolic and diastolic congestive heart failure, paroxysmal atrial fibrillation, hypertension, hyperlipidemia, diabetes, chronic kidney disease stage 3b, CML, iron deficiency anemia, GI bleed due to duodenal ulcers (12/2017), BPH, hypothyroidism, and peripheral neuropathy with unsteady gait, who presents for follow-up of coronary artery disease and HFrEF.  I last saw him in early June, at which time he reported feeling fairly well though he endorsed continued exertional dyspnea with mild activity on further questioning.  He saw Dr. Gala Romney last month in the heart failure clinic, at which time he seemed to be doing better with NYHA class II symptoms.  His volume status also seemed to be better than at prior visits.  Today, Lucas Caldwell reports that he feels okay though he has had some ups and downs.  He feels a little bit more short of breath than when he saw Dr. Gala Romney last month.  He has also had a few episodes of chest pain prompting him to take morphine prescribed by hospice at least once or twice.  He has not taken any sublingual nitroglycerin.  His wife is concerned that Lucas Caldwell may be holding onto some extra fluid as his weight has been trending up.  He has not had any palpitations, lightheadedness, or falls.  He also denies bleeding, remaining on warfarin.  ROS: See HPI  Studies Reviewed: [name]   EKG Interpretation Date/Time:  Friday November 08 2022 15:54:54 EDT Ventricular Rate:  98 PR Interval:  184 QRS Duration:  184 QT  Interval:  416 QTC Calculation: 531 R Axis:   -82  Text Interpretation: Sinus rhythm with Premature ventricular complexes or Fusion complexes Left axis deviation Right bundle branch block Inferior infarct , age undetermined When compared with ECG of 21-Aug-2022 No significant change was found Confirmed by Leno Mathes, [name] [phone]) on 11/08/2022 4:05:00 PM    R/LHC (08/09/2022): 1. Severe CAD with high-grade severely calcified distal LM stenosis bifurcating into ostial LAD and LCX 2. EF 25% by echo 3. Elevated filling pressures with low cardiac output  Risk Assessment/Calculations:    CHA2DS2-VASc Score = 5  This indicates a 7.2% annual risk of stroke. The patient's score is based upon: CHF History: 1 HTN History: 1 Diabetes History: 0 Stroke History: 0 Vascular Disease History: 1 Age Score: 2 Gender Score: 0          Physical Exam:   VS:  BP 116/76 (BP Location: Left Arm, Patient Position: Sitting, Cuff Size: Normal)   Pulse 98   Ht 5\' 4"  (1.626 m)   Wt 198 lb 12.8 oz (90.2 kg)   SpO2 100%   BMI 34.12 kg/m    Wt Readings from Last 3 Encounters:  11/08/22 198 lb 12.8 oz (90.2 kg)  10/02/22 189 lb (85.7 kg)  09/04/22 192 lb (87.1 kg)    General:  NAD. Neck: JVP 6-8 cm with positive HJR. Lungs: Clear to auscultation bilaterally without wheezes or crackles. Heart: Regular rate and rhythm without murmurs, rubs, or gallops. Abdomen: Soft, nontender, nondistended. Extremities: 1+ pretibial edema bilaterally.  ASSESSMENT AND PLAN: .  Chronic HFrEF: Lucas Caldwell appears a little bit more volume overloaded than at his last visit with Dr. Gala Romney.  His weight is also up 9 pounds over the last month.  We will increase his torsemide to 80 mg twice daily till his weight returns back to baseline, at which time he can reduce his afternoon dose back to 40 mg.  He should continue dapagliflozin.  [name] not currently on a beta-blocker or ACE inhibitor/ARB due to soft blood pressures,  CKD, and low output heart failure.  I will recheck a BMP when he returns for his anticoagulation visit next week.  [name] due to follow-up with Dr. Gala Romney next month.  Coronary artery disease: Lucas Caldwell reports a few episodes of chest pain, which certainly could be due to his significant CAD.  [name] not a candidate for revascularization of his distal LMCA.  Continue ranolazine for antianginal therapy as well as aspirin and rosuvastatin.  In the setting of long-term anticoagulation with warfarin, I would have a low threshold for discontinuation of aspirin in the future, particularly if bleeding becomes an issue.  Paroxysmal atrial fibrillation: No evidence of recurrence.  Continue warfarin per anticoagulation clinic.  Hyperlipidemia: Continue low-dose rosuvastatin for secondary prevention of CAD, given concerns about intolerance to higher doses due to myopathy.  Chronic kidney disease: Creatinine a little better on last check.  However, with escalation of diuresis today, we will plan to repeat a BMP in 1 week.    Dispo: Follow-up with Dr. Gala Romney as scheduled next month.  Return to see me in 3 months.  Signed, Yvonne Kendall, MD

## 2022-11-10 ENCOUNTER — Encounter: Payer: Self-pay | Admitting: Internal Medicine

## 2022-11-11 ENCOUNTER — Ambulatory Visit: Payer: Medicare HMO | Admitting: Family Medicine

## 2022-11-13 ENCOUNTER — Ambulatory Visit: Payer: Medicare HMO | Attending: Internal Medicine

## 2022-11-13 DIAGNOSIS — Z79899 Other long term (current) drug therapy: Secondary | ICD-10-CM | POA: Diagnosis not present

## 2022-11-13 DIAGNOSIS — I480 Paroxysmal atrial fibrillation: Secondary | ICD-10-CM

## 2022-11-13 DIAGNOSIS — Z7901 Long term (current) use of anticoagulants: Secondary | ICD-10-CM | POA: Diagnosis not present

## 2022-11-13 LAB — POCT INR: INR: 3.6 — AB (ref 2.0–3.0)

## 2022-11-13 NOTE — Patient Instructions (Signed)
HOLD TONIGHT ONLY THEN Continue dosage of warfarin of 1.5 TABLETS EVERY DAY EXCEPT 1 TABLET ON MONDAYS & FRIDAYS.  Recheck INR in 4 weeks

## 2022-11-14 LAB — BASIC METABOLIC PANEL
BUN/Creatinine Ratio: 17 (ref 10–24)
BUN: 33 mg/dL — ABNORMAL HIGH (ref 8–27)
CO2: 25 mmol/L (ref 20–29)
Calcium: 9.4 mg/dL (ref 8.6–10.2)
Chloride: 96 mmol/L (ref 96–106)
Creatinine, Ser: 1.9 mg/dL — ABNORMAL HIGH (ref 0.76–1.27)
Glucose: 199 mg/dL — ABNORMAL HIGH (ref 70–99)
Potassium: 4.5 mmol/L (ref 3.5–5.2)
Sodium: 139 mmol/L (ref 134–144)
eGFR: 34 mL/min/{1.73_m2} — ABNORMAL LOW (ref >59)

## 2022-11-15 ENCOUNTER — Encounter: Payer: Medicare HMO | Admitting: Internal Medicine

## 2022-11-25 ENCOUNTER — Other Ambulatory Visit: Payer: Self-pay | Admitting: Internal Medicine

## 2022-11-28 ENCOUNTER — Ambulatory Visit (INDEPENDENT_AMBULATORY_CARE_PROVIDER_SITE_OTHER): Payer: Medicare HMO

## 2022-11-28 ENCOUNTER — Other Ambulatory Visit: Payer: Self-pay

## 2022-11-28 ENCOUNTER — Telehealth: Payer: Self-pay | Admitting: Internal Medicine

## 2022-11-28 DIAGNOSIS — Z0000 Encounter for general adult medical examination without abnormal findings: Secondary | ICD-10-CM

## 2022-11-28 MED ORDER — ROSUVASTATIN CALCIUM 5 MG PO TABS: 5.0000 mg | ORAL_TABLET | Freq: Every day | ORAL | 3 refills | Status: DC

## 2022-11-28 NOTE — Patient Instructions (Addendum)
Lucas Caldwell , Thank you for taking time to come for your Medicare Wellness Visit. I appreciate your ongoing commitment to your health goals. Please review the following plan we discussed and let me know if I can assist you in the future.   Referrals/Orders/Follow-Ups/Clinician Recommendations: none  This is a list of the screening recommended for you and due dates:  Health Maintenance  Topic Date Due   Zoster (Shingles) Vaccine (1 of 2) Never done   Flu Shot  10/17/2022   COVID-19 Vaccine (5 - 2023-24 season) 11/17/2022   Hemoglobin A1C  12/06/2022   Eye exam for diabetics  03/22/2023   Complete foot exam   06/05/2023   Pneumonia Vaccine  Completed   HPV Vaccine  Aged Out   DTaP/Tdap/Td vaccine  Discontinued    Advanced directives: (ACP Link)Information on Advanced Care Planning can be found at [facility] [place] Advance Health Care Directives Advance Health Care Directives ([email or web address]   Next Medicare Annual Wellness Visit scheduled for next year: Yes   12/04/23 @ 1:00 pm by phone

## 2022-11-28 NOTE — Telephone Encounter (Signed)
*  STAT* If patient is at the pharmacy, call can be transferred to refill team.   1. Which medications need to be refilled? (please list name of each medication and dose if known) rosuvastatin (CRESTOR) 5 MG tablet   2. Which pharmacy/location (including street and city if local pharmacy) is medication to be sent to?  [id number] - [place], [place] - [address] AT [facility] [address]      3. Do they need a 30 day or 90 day supply? 90 day    Pt is out of medication

## 2022-11-28 NOTE — Progress Notes (Signed)
Subjective:   Lucas Caldwell is a 89 y.o. male who presents for Medicare Annual/Subsequent preventive examination.  Visit Complete: Virtual  I connected with  Lucas Caldwell on 11/28/22 by a audio enabled telemedicine application and verified that I am speaking with the correct person using two identifiers.Spoke w/ wife Lucas Caldwell  Patient Location: Home  Provider Location: Office/Clinic  I discussed the limitations of evaluation and management by telemedicine. The patient expressed understanding and agreed to proceed.  Vital Signs: Unable to obtain new vitals due to this being a telehealth visit.  Review of Systems     Cardiac Risk Factors include: advanced age (>89men, >21 women);diabetes mellitus;dyslipidemia;hypertension;male gender;obesity (BMI >30kg/m2);sedentary lifestyle     Objective:    There were no vitals filed for this visit. There is no height or weight on file to calculate BMI.     11/28/2022    2:01 PM 08/09/2022   10:28 AM 07/23/2022   10:32 AM 02/24/2022    6:31 PM 12/11/2021    3:50 PM 11/14/2021   11:01 AM 02/23/2021    6:42 AM  Advanced Directives  Does Patient Have a Medical Advance Directive? No No No No No No No  Would patient like information on creating a medical advance directive? No - Patient declined No - Patient declined   No - Patient declined No - Patient declined     Current Medications (verified) Outpatient Encounter Medications as of 11/28/2022  Medication Sig   acetaminophen (TYLENOL) 500 MG tablet Take 1,000 mg by mouth 2 (two) times daily as needed for moderate pain or headache.   Alcohol Swabs (B-D SINGLE USE SWABS REGULAR) PADS Use to check blood sugar up to 2 times daily   allopurinol (ZYLOPRIM) 100 MG tablet TAKE 1 TABLET BY MOUTH EVERY DAY   aspirin 81 MG chewable tablet Chew 81 mg by mouth in the morning.   famotidine (PEPCID) 40 MG tablet Take 1 tablet (40 mg total) by mouth 2 (two) times daily before a meal.   gabapentin  (NEURONTIN) 600 MG tablet Take 600 mg by mouth 2 (two) times daily.   Insulin Glargine ([name] KWIKPEN) 100 UNIT/ML Inject 22 Units into the skin daily. (Patient taking differently: Inject 22 Units into the skin at bedtime.)   JARDIANCE 10 MG TABS tablet Take 1 tablet (10 mg total) by mouth daily before breakfast.   levothyroxine (SYNTHROID) 25 MCG tablet TAKE 1 TABLET BY MOUTH EVERY MORNING BEFORE BREAKFAST   Menthol-Zinc Oxide (GOLD BOND EX) Apply 1 application  topically daily as needed (muscle pain).   nitroGLYCERIN (NITROSTAT) 0.4 MG SL tablet For chest pain, tightness, or pressure. While sitting, place 1 tablet under tongue. May be used every 5 minutes as needed, for up to 15 minutes. Do not use more than 3 tablets.   Polyethyl Glycol-Propyl Glycol (SYSTANE OP) Place 1 drop into both eyes 3 (three) times daily as needed (dry/irritated eyes.).    polyethylene glycol (MIRALAX / GLYCOLAX) 17 g packet Take 17 g by mouth daily as needed for mild constipation.    QUEtiapine (SEROQUEL) 25 MG tablet Take 1 tablet (25 mg total) by mouth at bedtime.   ranolazine (RANEXA) 500 MG 12 hr tablet Take 1 tablet (500 mg total) by mouth 2 (two) times daily.   rosuvastatin (CRESTOR) 5 MG tablet TAKE 1 TABLET(5 MG) BY MOUTH DAILY   tamsulosin (FLOMAX) 0.4 MG CAPS capsule Take 1 capsule (0.4 mg total) by mouth daily after supper.   torsemide (DEMADEX)  20 MG tablet TAKE 4 TABLET BY MOUTH IN THE MORNING AND 2 IN THE EVENING(TOTAL 6 DAILY)   traMADol (ULTRAM) 50 MG tablet Take 1-2 tablets (50-100 mg total) by mouth at bedtime as needed. (Patient taking differently: Take 50-100 mg by mouth at bedtime as needed (pain.).)   warfarin (COUMADIN) 3 MG tablet Take 1 to 1 and 1/2 tablets by mouth daily or as directed by [facility]   amoxicillin-clavulanate (AUGMENTIN) 875-125 MG tablet Take 1 tablet by mouth 2 (two) times daily. (Patient not taking: Reported on 10/02/2022)   Fluticasone-Umeclidin-Vilant (TRELEGY ELLIPTA)  100-62.5-25 MCG/ACT AEPB Inhale 1 puff into the lungs daily. (Patient not taking: Reported on 10/02/2022)   No facility-administered encounter medications on file as of 11/28/2022.    Allergies (verified) Clopidogrel, Ciprofloxacin, Beta adrenergic blockers, Mirtazapine, and Spironolactone   History: Past Medical History:  Diagnosis Date   Arthritis    "probably in his legs" (08/15/2016)   Blind right eye    BPH (benign prostatic hyperplasia)    Breast asymmetry    Left breast is larger, present for several years.   CAD (coronary artery disease)    a. Cath in the late 90's - reportedly ok;  b. 2014 s/p stenting x 2 @ [facility]; c 08/19/16 Cath/PCI with DES -> RCA, plan to treat LM 70% medically. Seen by surgery and felt to be too high risk for CABG; d. 08/2018 Cath: LM 70 (iFR 0.86), LAD 20ost, 40p, 50/63m, D1 70, LCX patent stent, OM1 30, RCA patent stent, 66m ISR, 40d, RPDA 30. PCWP 8. CO/CI 4.0/2.1; e. 03/2019 PCI to LAD (2.5x15 Resolute Onyx DES).   Chronic combined systolic (congestive) and diastolic (congestive) heart failure (HCC)    a. Previously reduced EF-->50% by echo in 2012;  b. 06/2015 Echo: EF 50-55%  c. 07/2016 Echo: EF 45-50%; d. 12/2017 Echo: EF 55%; e. 08/2018 Echo: EF 35-40%; f. 04/2019 Echo: EF 30-35%; g. s/p cardiomems; h. 10/2020 Echo: EF 30-35%; i. 01/2022 Echo: EF 30-35%, glob HK, GrII DD, nl rV fxn, mild to mod MR.   Chronic kidney disease (CKD), stage IV (severe) (HCC)    CML (chronic myelocytic leukemia) (HCC)    GERD (gastroesophageal reflux disease)    GIB (gastrointestinal bleeding)    a. 12/2017 3 unit PRBC GIB in setting of coumadin-->Endo/colon multiple duodenal ulcers and a single bleeding ulcer in the proximal ascending colon status post hemostatic clipping x2.   Gout    Hyperlipemia    Hypertension    Hypothyroidism    Iron deficiency anemia    Ischemic cardiomyopathy    a. Previously reduced EF-->50% by echo in 2012;  b. 06/2015 Echo: EF 50-55%, Gr2 DD;  c. 07/2016  Echo: EF 45-50%; d. 12/2017 Echo: EF 55%, Gr1 DD, mild MR; e. 08/2018 Echo: EF 35-40%; f. 04/2019 Echo: EF 30-35%; g. 10/2020 Echo: EF 30-35%; h. Echo: EF 30-35%   Migraine    "in the 1960s" (08/15/2016)   Obstructive sleep apnea    Orthostatic hypotension    a. 20222 -->carvedilol and hydralazine d/c'd.   PAF (paroxysmal atrial fibrillation) (HCC)    a. ? Dx 2014-->s/p DCCV;  b. CHA2DS2VASc = 6--> Coumadin.   Prostate cancer (HCC)    RBBB    Type II diabetes mellitus [facility])    Past Surgical History:  Procedure Laterality Date   AMPUTATION TOE Right 02/23/2021   Procedure: AMPUTATION TOE;  Surgeon: Gwyneth Revels, DPM;  Location: [facility] ORS;  Service: Podiatry;  Laterality: Right;  CATARACT EXTRACTION W/ INTRAOCULAR LENS  IMPLANT, BILATERAL Bilateral    COLONOSCOPY N/A 01/13/2018   Procedure: COLONOSCOPY;  Surgeon: Toney Reil, MD;  Location: [facility] ENDOSCOPY;  Service: Gastroenterology;  Laterality: N/A;   COLONOSCOPY WITH PROPOFOL N/A 01/01/2018   Procedure: COLONOSCOPY WITH PROPOFOL;  Surgeon: Toney Reil, MD;  Location: [facility] ENDOSCOPY;  Service: Gastroenterology;  Laterality: N/A;   CORONARY ANGIOPLASTY WITH STENT PLACEMENT  09/2012   2 stents   CORONARY STENT INTERVENTION N/A 08/19/2016   Procedure: Coronary Stent Intervention;  Surgeon: Yvonne Kendall, MD;  Location: [facility] INV[facility] CV LAB;  Service: Cardiovascular;  Laterality: N/A;   CORONARY STENT INTERVENTION N/A 04/13/2019   Procedure: CORONARY STENT INTERVENTION;  Surgeon: Yvonne Kendall, MD;  Location: [facility] INVASIVE CV LAB;  Service: Cardiovascular;  Laterality: N/A;   ESOPHAGEAL MANOMETRY N/A 12/16/2017   Procedure: ESOPHAGEAL MANOMETRY (EM);  Surgeon: Toney Reil, MD;  Location: [facility] ENDOSCOPY;  Service: Gastroenterology;  Laterality: N/A;   ESOPHAGOGASTRODUODENOSCOPY N/A 12/20/2016   Procedure: ESOPHAGOGASTRODUODENOSCOPY (EGD);  Surgeon: Toney Reil, MD;  Location: [facility] ENDOSCOPY;  Service:  Gastroenterology;  Laterality: N/A;   ESOPHAGOGASTRODUODENOSCOPY N/A 01/13/2018   Procedure: ESOPHAGOGASTRODUODENOSCOPY (EGD);  Surgeon: Toney Reil, MD;  Location: [facility] ENDOSCOPY;  Service: Gastroenterology;  Laterality: N/A;   ESOPHAGOGASTRODUODENOSCOPY (EGD) WITH PROPOFOL N/A 11/03/2017   Procedure: ESOPHAGOGASTRODUODENOSCOPY (EGD) WITH PROPOFOL;  Surgeon: Toney Reil, MD;  Location: [facility] ENDOSCOPY;  Service: Gastroenterology;  Laterality: N/A;   EYE SURGERY     HERNIA REPAIR     "navel"   LEFT HEART CATH AND CORONARY ANGIOGRAPHY N/A 08/14/2016   Procedure: Left Heart Cath and Coronary Angiography;  Surgeon: Yvonne Kendall, MD;  Location: [facility] INVASIVE CV LAB;  Service: Cardiovascular;  Laterality: N/A;   PRESSURE SENSOR/CARDIOMEMS N/A 09/23/2019   Procedure: PRESSURE SENSOR/CARDIOMEMS;  Surgeon: Dolores Patty, MD;  Location: [facility];  Service: Cardiovascular;  Laterality: N/A;   RIGHT HEART CATH N/A 09/23/2019   Procedure: RIGHT HEART CATH;  Surgeon: Dolores Patty, MD;  Location: [facility];  Service: Cardiovascular;  Laterality: N/A;   RIGHT/LEFT HEART CATH AND CORONARY ANGIOGRAPHY N/A 09/01/2018   Procedure: RIGHT/LEFT HEART CATH AND CORONARY ANGIOGRAPHY;  Surgeon: Yvonne Kendall, MD;  Location: [facility] INVASIVE CV LAB;  Service: Cardiovascular;  Laterality: N/A;   RIGHT/LEFT HEART CATH AND CORONARY ANGIOGRAPHY N/A 04/13/2019   Procedure: RIGHT/LEFT HEART CATH AND CORONARY ANGIOGRAPHY;  Surgeon: Yvonne Kendall, MD;  Location: [facility] INVASIVE CV LAB;  Service: Cardiovascular;  Laterality: N/A;   RIGHT/LEFT HEART CATH AND CORONARY ANGIOGRAPHY N/A 08/09/2022   Procedure: RIGHT/LEFT HEART CATH AND CORONARY ANGIOGRAPHY;  Surgeon: Dolores Patty, MD;  Location: [facility];  Service: Cardiovascular;  Laterality: N/A;   TOE AMPUTATION Right    "big toe"   Family History  Problem Relation Age of Onset   Brain cancer Father    Diabetes Sister     Heart attack Sister    Social History   Socioeconomic History   Marital status: Married    Spouse name: Pino Dueck   Number of children: 2   Years of education: Not on file   Highest education level: 7th grade  Occupational History   Occupation: Retired    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  Tobacco Use   Smoking status: Former    Current packs/day: 0.00    Average packs/day: 1.8 packs/day for 20.0 years (35.0 ttl pk-yrs)    Types: Cigarettes  Start date: 74    Quit date: 1975    Years since quitting: 49.7   Smokeless tobacco: Former  Building services engineer status: Never Used  Substance and Sexual Activity   Alcohol use: No    Comment: previously drank heavily - quit 1979.   Drug use: No   Sexual activity: Never  Other Topics Concern   Not on file  Social History Narrative   Working on transportation truck part time    Social Determinants of Health   Financial Resource Strain: Low Risk  (11/28/2022)   Overall Financial Resource Strain (CARDIA)    Difficulty of Paying Living Expenses: Not hard at all  Food Insecurity: No Food Insecurity (11/28/2022)   Hunger Vital Sign    Worried About Running Out of Food in the Last Year: Never true    Ran Out of Food in the Last Year: Never true  Transportation Needs: No Transportation Needs (11/28/2022)   PRAPARE - Administrator, Civil Service (Medical): No    Lack of Transportation (Non-Medical): No  Physical Activity: Inactive (11/28/2022)   Exercise Vital Sign    Days of Exercise per Week: 0 days    Minutes of Exercise per Session: 0 min  Stress: No Stress Concern Present (11/28/2022)   [employer] of Occupational Health - Occupational Stress Questionnaire    Feeling of Stress : Not at all  Social Connections: Moderately Isolated (11/28/2022)   Social Connection and Isolation Panel [NHANES]    Frequency of Communication with Friends and Family: More than three times a week    Frequency of  Social Gatherings with Friends and Family: More than three times a week    Attends Religious Services: Never    Database administrator or Organizations: No    Attends Engineer, structural: Never    Marital Status: Married    Tobacco Counseling Counseling given: Not Answered   Clinical Intake:  Pre-visit preparation completed: Yes  Pain : No/denies pain     Nutritional Status: BMI > 30  Obese Nutritional Risks: None Diabetes: No CBG done?: No Did pt. bring in CBG monitor from home?: No  How often do you need to have someone help you when you read instructions, pamphlets, or other written materials from your doctor or pharmacy?: 1 - Never  Interpreter Needed?: No  Information entered by :: Kennedy Bucker, LPN   Activities of Daily Living    11/28/2022    2:02 PM 01/28/2022    9:08 AM  In your present state of health, do you have any difficulty performing the following activities:  Hearing? 1 0  Vision? 0 0  Difficulty concentrating or making decisions? 0 0  Walking or climbing stairs? 1 0  Dressing or bathing? 0 0  Doing errands, shopping? 0 0  Preparing Food and eating ? N N  Using the Toilet? N N  In the past six months, have you accidently leaked urine? N N  Do you have problems with loss of bowel control? N N  Managing your Medications? N N  Managing your Finances? N N  Housekeeping or managing your Housekeeping? N N    Patient Care Team: Smitty Cords, DO as PCP - General (Family Medicine) End, [name], MD as PCP - Cardiology (Cardiology) Bensimhon, Bevelyn Buckles, MD as PCP - Advanced Heart Failure (Cardiology) Delles, Jackelyn Poling, RPH-CPP as Pharmacist Arlana Pouch Megan Mans, RN as Case Manager (General Practice) Tat, Octaviano Batty, DO as Consulting  Physician (Neurology)  Indicate any recent Medical Services you may have received from other than [facility] providers in the past year (date may be approximate).     Assessment:   This is a routine  wellness examination for Lucas Caldwell.  Hearing/Vision screen Hearing Screening - Comments:: Wears aids Vision Screening - Comments:: Wears glasses- [facility]   Goals Addressed             This Visit's Progress    DIET - EAT MORE FRUITS AND VEGETABLES         Depression Screen    11/28/2022    2:00 PM 06/05/2022    7:00 PM 11/05/2021    9:24 AM 10/05/2021    3:12 PM 09/03/2021    3:46 PM 04/13/2021   10:57 AM 10/02/2020    3:43 PM  PHQ 2/9 Scores  PHQ - 2 Score 0 0 0 0 0 0 0  PHQ- 9 Score 0   2  2     Fall Risk    11/28/2022    2:02 PM 06/10/2022    9:29 AM 04/01/2022    9:46 AM 01/28/2022    9:08 AM 10/05/2021    3:12 PM  Fall Risk   Falls in the past year? 0 0 0 0 0  Number falls in past yr: 0 0 0 0 0  Injury with Fall? 0 0 0 0 0  Risk for fall due to : No Fall Risks Impaired balance/gait Impaired balance/gait Impaired balance/gait Impaired balance/gait  Follow up Falls prevention discussed;Falls evaluation completed Falls evaluation completed;Education provided;Falls prevention discussed Falls evaluation completed;Education provided;Falls prevention discussed Falls evaluation completed;Education provided;Falls prevention discussed Falls evaluation completed    MEDICARE RISK AT HOME:    TIMED UP AND GO:  Was the test performed?  No    Cognitive Function: pt unavailable per wife     11/18/2017    4:01 PM  MMSE - Mini Mental State Exam  Orientation to time 5  Orientation to Place 5  Registration 3  Attention/ Calculation 5  Recall 2  Language- name 2 objects 2  Language- repeat 1  Language- follow 3 step command 3  Language- read & follow direction 1  Write a sentence 1  Copy design 0  Total score 28        09/03/2021    3:46 PM  6CIT Screen  What Year? 0 points  What month? 0 points  What time? 0 points  Count back from 20 0 points  Months in reverse 2 points  Repeat phrase 10 points  Total Score 12 points    Immunizations Immunization History   Administered Date(s) Administered   Fluad Quad(high Dose 65+) 12/11/2020, 12/05/2021   Influenza, High Dose Seasonal PF 12/29/2014, 12/19/2016, 12/22/2017   Influenza, Seasonal, Injecte, Preservative Fre 01/13/2007, 12/13/2008, 01/30/2010, 12/26/2011   Influenza,inj,Quad PF,6+ Mos 12/25/2012, 12/09/2015, 11/26/2018, 12/15/2019   Influenza-Unspecified 12/19/2016, 12/22/2017   PFIZER(Purple Top)SARS-COV-2 Vaccination 05/13/2019, 06/08/2019, 12/15/2019, 05/19/2020   PPD Test 09/25/2010   Pneumococcal Conjugate-13 01/31/2014   Pneumococcal Polysaccharide-23 06/20/2008, 09/02/2010    TDAP status: Due, Education has been provided regarding the importance of this vaccine. Advised may receive this vaccine at local pharmacy or Health Dept. Aware to provide a copy of the vaccination record if obtained from local pharmacy or Health Dept. Verbalized acceptance and understanding.  Flu Vaccine status: Due, Education has been provided regarding the importance of this vaccine. Advised may receive this vaccine at local pharmacy or Health Dept. Aware  to provide a copy of the vaccination record if obtained from local pharmacy or Health Dept. Verbalized acceptance and understanding.  Pneumococcal vaccine status: Up to date  Covid-19 vaccine status: Completed vaccines  Qualifies for Shingles Vaccine? Yes   Zostavax completed No   Shingrix Completed?: No.    Education has been provided regarding the importance of this vaccine. Patient has been advised to call insurance company to determine out of pocket expense if they have not yet received this vaccine. Advised may also receive vaccine at local pharmacy or Health Dept. Verbalized acceptance and understanding.  Screening Tests Health Maintenance  Topic Date Due   Zoster Vaccines- Shingrix (1 of 2) Never done   INFLUENZA VACCINE  10/17/2022   COVID-19 Vaccine (5 - 2023-24 season) 11/17/2022   HEMOGLOBIN A1C  12/06/2022   OPHTHALMOLOGY EXAM  03/22/2023    FOOT EXAM  06/05/2023   Pneumonia Vaccine 11+ Years old  Completed   HPV VACCINES  Aged Out   DTaP/Tdap/Td  Discontinued    Health Maintenance  Health Maintenance Due  Topic Date Due   Zoster Vaccines- Shingrix (1 of 2) Never done   INFLUENZA VACCINE  10/17/2022   COVID-19 Vaccine (5 - 2023-24 season) 11/17/2022    Colorectal cancer screening: No longer required.   Lung Cancer Screening: (Low Dose CT Chest recommended if Age 98-80 years, 20 pack-year currently smoking OR have quit w/in 15years.) does not qualify.    Additional Screening:  Hepatitis C Screening: does not qualify; Completed no  Vision Screening: Recommended annual ophthalmology exams for early detection of glaucoma and other disorders of the eye. Is the patient up to date with their annual eye exam?  Yes  Who is the provider or what is the name of the office in which the patient attends annual eye exams? [facility] If pt is not established with a provider, would they like to be referred to a provider to establish care? No .   Dental Screening: Recommended annual dental exams for proper oral hygiene  Diabetic Foot Exam: Diabetic Foot Exam: Completed 06/05/22  Community Resource Referral / Chronic Care Management: CRR required this visit?  No   CCM required this visit?  No     Plan:     I have personally reviewed and noted the following in the patient's chart:   Medical and social history Use of alcohol, tobacco or illicit drugs  Current medications and supplements including opioid prescriptions. Patient is not currently taking opioid prescriptions. Functional ability and status Nutritional status Physical activity Advanced directives List of other physicians Hospitalizations, surgeries, and ER visits in previous 12 months Vitals Screenings to include cognitive, depression, and falls Referrals and appointments  In addition, I have reviewed and discussed with patient certain preventive protocols,  quality metrics, and best practice recommendations. A written personalized care plan for preventive services as well as general preventive health recommendations were provided to patient.     Hal Hope, LPN   [id number]   After Visit Summary: (MyChart) Due to this being a telephonic visit, the after visit summary with patients personalized plan was offered to patient via MyChart   Nurse Notes: none

## 2022-12-02 ENCOUNTER — Encounter: Payer: Medicare HMO | Admitting: Internal Medicine

## 2022-12-02 ENCOUNTER — Telehealth: Payer: Self-pay

## 2022-12-02 NOTE — Telephone Encounter (Signed)
Called received from [employer] (hospice care RN)  regarding pt and spouse concerns.  Pt missed today's appointment.   Pt current weight 200lb. RN reports that pt has Facial Edema and lung crackles.   Pt currently taking Torsemide 80 mg two times a day. And is not taking KCL.   Amy (Hospice care RN) reports pt is currently on 3-4 L  [place] cont.  RN is asking if there is more room for diuretics, if pt should be on KCL and if pt should be seen.  Will route to Clarisa Kindred FNP to make aware of concerns.

## 2022-12-02 NOTE — Telephone Encounter (Signed)
Pt c/o swelling/edema: STAT if pt has developed SOB within 24 hours  If swelling, where is the swelling located? Left arm and facial  How much weight have you gained and in what time span? 10 lbs in a week  Have you gained 2 pounds in a day or 5 pounds in a week? Yes   Do you have a log of your daily weights (if so, list)? Yes   Are you currently taking a fluid pill? Yes   Are you currently SOB? Yes   Have you traveled recently in a car or plane for an extended period of time? No

## 2022-12-02 NOTE — Telephone Encounter (Signed)
Spoke with Amy, pt's home health nurse who reports despite pt increasing torsemide to 80 mg BID, pt continues experience swelling. However, Amy was under the impression she was speaking to the heart failure clinic and noted she would disconnect the line and contact their office.

## 2022-12-03 ENCOUNTER — Telehealth: Payer: Self-pay

## 2022-12-03 ENCOUNTER — Ambulatory Visit (HOSPITAL_BASED_OUTPATIENT_CLINIC_OR_DEPARTMENT_OTHER): Admitting: Family

## 2022-12-03 ENCOUNTER — Other Ambulatory Visit
Admission: RE | Admit: 2022-12-03 | Discharge: 2022-12-03 | Disposition: A | Discharge status: Home / Self Care | Source: Home / Self Care | Attending: Family | Admitting: Family

## 2022-12-03 ENCOUNTER — Ambulatory Visit
Admission: RE | Admit: 2022-12-03 | Discharge: 2022-12-03 | Disposition: A | Discharge status: Home / Self Care | Source: Ambulatory Visit | Attending: Family | Admitting: Family

## 2022-12-03 ENCOUNTER — Other Ambulatory Visit: Payer: Self-pay | Admitting: Family

## 2022-12-03 ENCOUNTER — Encounter: Payer: Self-pay | Admitting: Family

## 2022-12-03 VITALS — BP 114/73 | HR 82 | Wt 201.0 lb

## 2022-12-03 DIAGNOSIS — E1122 Type 2 diabetes mellitus with diabetic chronic kidney disease: Secondary | ICD-10-CM | POA: Diagnosis not present

## 2022-12-03 DIAGNOSIS — I471 Supraventricular tachycardia, unspecified: Secondary | ICD-10-CM | POA: Insufficient documentation

## 2022-12-03 DIAGNOSIS — I10 Essential (primary) hypertension: Secondary | ICD-10-CM

## 2022-12-03 DIAGNOSIS — I480 Paroxysmal atrial fibrillation: Secondary | ICD-10-CM

## 2022-12-03 DIAGNOSIS — E1121 Type 2 diabetes mellitus with diabetic nephropathy: Secondary | ICD-10-CM | POA: Diagnosis not present

## 2022-12-03 DIAGNOSIS — I25118 Atherosclerotic heart disease of native coronary artery with other forms of angina pectoris: Secondary | ICD-10-CM

## 2022-12-03 DIAGNOSIS — I509 Heart failure, unspecified: Secondary | ICD-10-CM

## 2022-12-03 DIAGNOSIS — Z794 Long term (current) use of insulin: Secondary | ICD-10-CM | POA: Diagnosis not present

## 2022-12-03 DIAGNOSIS — Z9981 Dependence on supplemental oxygen: Secondary | ICD-10-CM | POA: Diagnosis not present

## 2022-12-03 DIAGNOSIS — I493 Ventricular premature depolarization: Secondary | ICD-10-CM | POA: Diagnosis not present

## 2022-12-03 DIAGNOSIS — Z87891 Personal history of nicotine dependence: Secondary | ICD-10-CM | POA: Insufficient documentation

## 2022-12-03 DIAGNOSIS — I5022 Chronic systolic (congestive) heart failure: Secondary | ICD-10-CM

## 2022-12-03 DIAGNOSIS — I2510 Atherosclerotic heart disease of native coronary artery without angina pectoris: Secondary | ICD-10-CM | POA: Diagnosis not present

## 2022-12-03 DIAGNOSIS — I130 Hypertensive heart and chronic kidney disease with heart failure and stage 1 through stage 4 chronic kidney disease, or unspecified chronic kidney disease: Secondary | ICD-10-CM | POA: Diagnosis not present

## 2022-12-03 DIAGNOSIS — Z7901 Long term (current) use of anticoagulants: Secondary | ICD-10-CM | POA: Diagnosis not present

## 2022-12-03 DIAGNOSIS — Z79899 Other long term (current) drug therapy: Secondary | ICD-10-CM | POA: Insufficient documentation

## 2022-12-03 DIAGNOSIS — N1832 Chronic kidney disease, stage 3b: Secondary | ICD-10-CM | POA: Insufficient documentation

## 2022-12-03 DIAGNOSIS — G9340 Encephalopathy, unspecified: Secondary | ICD-10-CM | POA: Diagnosis not present

## 2022-12-03 LAB — BRAIN NATRIURETIC PEPTIDE: B Natriuretic Peptide: 1874.9 pg/mL — ABNORMAL HIGH (ref 0.0–100.0)

## 2022-12-03 LAB — BASIC METABOLIC PANEL
Anion gap: 13 (ref 5–15)
BUN: 53 mg/dL — ABNORMAL HIGH (ref 8–23)
CO2: 25 mmol/L (ref 22–32)
Calcium: 9 mg/dL (ref 8.9–10.3)
Chloride: 98 mmol/L (ref 98–111)
Creatinine, Ser: 2.39 mg/dL — ABNORMAL HIGH (ref 0.61–1.24)
GFR, Estimated: 26 mL/min — ABNORMAL LOW (ref >60)
Glucose, Bld: 138 mg/dL — ABNORMAL HIGH (ref 70–99)
Potassium: 4.4 mmol/L (ref 3.5–5.1)
Sodium: 136 mmol/L (ref 135–145)

## 2022-12-03 MED ORDER — FUROSEMIDE 10 MG/ML IJ SOLN
INTRAMUSCULAR | Status: AC
  Filled 2022-12-03: qty 8

## 2022-12-03 MED ORDER — FUROSEMIDE 10 MG/ML IJ SOLN
80.0000 mg | Freq: Once | INTRAMUSCULAR | Status: AC
  Administered 2022-12-03: 80 mg via INTRAVENOUS

## 2022-12-03 MED ORDER — POTASSIUM CHLORIDE CRYS ER 20 MEQ PO TBCR
40.0000 meq | EXTENDED_RELEASE_TABLET | Freq: Once | ORAL | Status: AC
  Administered 2022-12-03: 40 meq via ORAL

## 2022-12-03 MED ORDER — POTASSIUM CHLORIDE CRYS ER 20 MEQ PO TBCR
EXTENDED_RELEASE_TABLET | ORAL | Status: AC
  Filled 2022-12-03: qty 2

## 2022-12-03 NOTE — Progress Notes (Signed)
Advanced Heart Failure Clinic Note    PCP:  Lucas Cords, DO (last seen 06/24)  Cardiologist:  Lucas Kendall, MD (last seen 08/24) HF Cardiologist: Lucas Mango, MD (last seen 07/24)    HPI:   Lucas Caldwell is a 89 y.o. 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   Has h/o NICM previously followed by Dr. Devonne Caldwell at [facility]. He had good response to medical therapy in 2012 EF was reported to be 50%    Had PCI of his RCA CAD s/p RCA stent x2 08/2016 at [facility].    Echo 09/11/18 LVEF 35-40%   Cardiac cath 08/2018 70% LM stenosis with abnl FFR of 0.86, though drop in iFR was due to diffuse prox LAD disease and only small gradient involving ostial LM. Imdur was increased.    Admitted 04/13/19 with chest pain and A/C systolic heart failure. Repeat Cath 1/21 LM 60% LAD 70% with +FFR otherwise diffuse non-obstructive CAD -> PCI/DES LAD 1/21. RHC: RA 10 PA 53/30 (40) PCWP 28 CO/CI 4.2/2.1 PA sat 67%.   Echo 04/22/19 EF 30-35%   Referred to [facility] 04/2019 for further management of his systolic HF. Had cardiomems implanted in 07/22   Zio 4/21 SR w/ 1st AVB, avg HR 81 bpm, 1 brief 5 beat run of NSVT, Two runs SVT   Echo 10/26/20 EF 30-35% RV ok   Echo 01/18/22 EF 30-35%  Underwent R/L cath on 08/09/22 for refractory HF. Found to have severe distal LMCA disease extending into the ostial LAD and LCx with elevated left and right heart filling pressures and low cardiac output (PCWP 34, RA 14, Fick CO/CI 4.1/2.1). Not felt to be candidate for PCI or surgery. Developed low output symptoms and severe encephalopathy. Palliative Care consulted and discharged home with Hospice services  Ao = 101/58 (76) LV = 107/28 RA = 14 RV = 59/16 PA = 59/29 (39) PCW = 34 Fick cardiac output/index = 4.1/2.1 PVR = 1.2 WU Ao sat = 97% PA sat = 60%, 61%  He presents today  for an acute visit with concerns of fluid retention per his hospice nurse. Has moderate SOB with minimal exertion. Wife and son feels like his SOB has worsened over the last couple of days. Has associated fatigue, lightheadedness, worsening pedal edema (L>R) & weight gain along with this. Wife gave him 80mg  torsemide AM/ 60mg  PM 2 days ago (Sunday) and then yesterday she gave him 80mg  BID without any improvement. Denies chest pain, palpitations or abdominal distention. Feels like he's having some difficulty sleeping due to orthopnea. Wearing oxygen at 4L around the clock. Has not taken any torsemide yet today  Drinks 2 bottles of water & 1 1/2 glasses of milk daily (unknown how many ounces in the glass). Not adding any salt to his food   ROS: All systems negative except as listed in HPI, PMH and Problem List.  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  Tobacco Use   Smoking status: Former    Current packs/day: 0.00    Average packs/day: 1.8 packs/day  for 20.0 years (35.0 ttl pk-yrs)    Types: Cigarettes    Start date: 23    Quit date: 18    Years since quitting: 49.7   Smokeless tobacco: Former  Building services engineer status: Never Used  Substance and Sexual Activity   Alcohol use: No    Comment: previously drank heavily - quit 1979.   Drug use: No   Sexual activity: Never  Other Topics Concern   Not on file  Social History Narrative   Working on transportation truck part time    Social Determinants of Health   Financial Resource Strain: Low Risk  (11/28/2022)   Overall Financial Resource Strain (CARDIA)    Difficulty of Paying Living Expenses: Not hard at all  Food Insecurity: No Food Insecurity (11/28/2022)   Hunger Vital Sign     Worried About Running Out of Food in the Last Year: Never true    Ran Out of Food in the Last Year: Never true  Transportation Needs: No Transportation Needs (11/28/2022)   PRAPARE - Administrator, Civil Service (Medical): No    Lack of Transportation (Non-Medical): No  Physical Activity: Inactive (11/28/2022)   Exercise Vital Sign    Days of Exercise per Week: 0 days    Minutes of Exercise per Session: 0 min  Stress: No Stress Concern Present (11/28/2022)   [employer] of Occupational Health - Occupational Stress Questionnaire    Feeling of Stress : Not at all  Social Connections: Moderately Isolated (11/28/2022)   Social Connection and Isolation Panel [NHANES]    Frequency of Communication with Friends and Family: More than three times a week    Frequency of Social Gatherings with Friends and Family: More than three times a week    Attends Religious Services: Never    Database administrator or Organizations: No    Attends Banker Meetings: Never    Marital Status: Married  Catering manager Violence: Not At Risk (11/28/2022)   Humiliation, Afraid, Rape, and Kick questionnaire    Fear of Current or Ex-Partner: No    Emotionally Abused: No    Physically Abused: No    Sexually Abused: No    FH:  Family History  Problem Relation Age of Onset   Brain cancer Father    Diabetes Sister    Heart attack Sister     Past Medical History:  Diagnosis Date   Arthritis    "probably in his legs" (08/15/2016)   Blind right eye    BPH (benign prostatic hyperplasia)    Breast asymmetry    Left breast is larger, present for several years.   CAD (coronary artery disease)    a. Cath in the late 90's - reportedly ok;  b. 2014 s/p stenting x 2 @ [facility]; c 08/19/16 Cath/PCI with DES -> RCA, plan to treat LM 70% medically. Seen by surgery and felt to be too high risk for CABG; d. 08/2018 Cath: LM 70 (iFR 0.86), LAD 20ost, 40p, 50/39m, D1 70, LCX patent stent, OM1 30, RCA  patent stent, 59m ISR, 40d, RPDA 30. PCWP 8. CO/CI 4.0/2.1; e. 03/2019 PCI to LAD (2.5x15 Resolute Onyx DES).   Chronic combined systolic (congestive) and diastolic (congestive) heart failure (HCC)    a. Previously reduced EF-->50% by echo in 2012;  b. 06/2015 Echo: EF 50-55%  c. 07/2016 Echo: EF 45-50%; d. 12/2017 Echo: EF 55%; e. 08/2018 Echo: EF 35-40%; f. 04/2019 Echo: EF 30-35%; g.  s/p cardiomems; h. 10/2020 Echo: EF 30-35%; i. 01/2022 Echo: EF 30-35%, glob HK, GrII DD, nl rV fxn, mild to mod MR.   Chronic kidney disease (CKD), stage IV (severe) (HCC)    CML (chronic myelocytic leukemia) (HCC)    GERD (gastroesophageal reflux disease)    GIB (gastrointestinal bleeding)    a. 12/2017 3 unit PRBC GIB in setting of coumadin-->Endo/colon multiple duodenal ulcers and a single bleeding ulcer in the proximal ascending colon status post hemostatic clipping x2.   Gout    Hyperlipemia    Hypertension    Hypothyroidism    Iron deficiency anemia    Ischemic cardiomyopathy    a. Previously reduced EF-->50% by echo in 2012;  b. 06/2015 Echo: EF 50-55%, Gr2 DD;  c. 07/2016 Echo: EF 45-50%; d. 12/2017 Echo: EF 55%, Gr1 DD, mild MR; e. 08/2018 Echo: EF 35-40%; f. 04/2019 Echo: EF 30-35%; g. 10/2020 Echo: EF 30-35%; h. Echo: EF 30-35%   Migraine    "in the 1960s" (08/15/2016)   Obstructive sleep apnea    Orthostatic hypotension    a. 20222 -->carvedilol and hydralazine d/c'd.   PAF (paroxysmal atrial fibrillation) (HCC)    a. ? Dx 2014-->s/p DCCV;  b. CHA2DS2VASc = 6--> Coumadin.   Prostate cancer (HCC)    RBBB    Type II diabetes mellitus (HCC)     Current Outpatient Medications  Medication Sig Dispense Refill   acetaminophen (TYLENOL) 500 MG tablet Take 1,000 mg by mouth 2 (two) times daily as needed for moderate pain or headache.     Alcohol Swabs (B-D SINGLE USE SWABS REGULAR) PADS Use to check blood sugar up to 2 times daily 200 each 3   allopurinol (ZYLOPRIM) 100 MG tablet TAKE 1 TABLET BY MOUTH EVERY  DAY 90 tablet 0   amoxicillin-clavulanate (AUGMENTIN) 875-125 MG tablet Take 1 tablet by mouth 2 (two) times daily. (Patient not taking: Reported on 10/02/2022) 20 tablet 0   aspirin 81 MG chewable tablet Chew 81 mg by mouth in the morning.     famotidine (PEPCID) 40 MG tablet Take 1 tablet (40 mg total) by mouth 2 (two) times daily before a meal. 180 tablet 1   Fluticasone-Umeclidin-Vilant (TRELEGY ELLIPTA) 100-62.5-25 MCG/ACT AEPB Inhale 1 puff into the lungs daily. (Patient not taking: Reported on 10/02/2022) 28 each 0   gabapentin (NEURONTIN) 600 MG tablet Take 600 mg by mouth 2 (two) times daily.     Insulin Glargine ([name] KWIKPEN) 100 UNIT/ML Inject 22 Units into the skin daily. (Patient taking differently: Inject 22 Units into the skin at bedtime.) 6 mL 0   JARDIANCE 10 MG TABS tablet Take 1 tablet (10 mg total) by mouth daily before breakfast. 30 tablet 5   levothyroxine (SYNTHROID) 25 MCG tablet TAKE 1 TABLET BY MOUTH EVERY MORNING BEFORE BREAKFAST 90 tablet 2   Menthol-Zinc Oxide (GOLD BOND EX) Apply 1 application  topically daily as needed (muscle pain).     nitroGLYCERIN (NITROSTAT) 0.4 MG SL tablet For chest pain, tightness, or pressure. While sitting, place 1 tablet under tongue. May be used every 5 minutes as needed, for up to 15 minutes. Do not use more than 3 tablets. 25 tablet 1   Polyethyl Glycol-Propyl Glycol (SYSTANE OP) Place 1 drop into both eyes 3 (three) times daily as needed (dry/irritated eyes.).      polyethylene glycol (MIRALAX / GLYCOLAX) 17 g packet Take 17 g by mouth daily as needed for mild constipation.  QUEtiapine (SEROQUEL) 25 MG tablet Take 1 tablet (25 mg total) by mouth at bedtime. 30 tablet 0   ranolazine (RANEXA) 500 MG 12 hr tablet Take 1 tablet (500 mg total) by mouth 2 (two) times daily. 180 tablet 2   rosuvastatin (CRESTOR) 5 MG tablet Take 1 tablet (5 mg total) by mouth daily. 90 tablet 3   tamsulosin (FLOMAX) 0.4 MG CAPS capsule Take 1 capsule (0.4  mg total) by mouth daily after supper. 30 capsule 11   torsemide (DEMADEX) 20 MG tablet TAKE 4 TABLET BY MOUTH IN THE MORNING AND 2 IN THE EVENING(TOTAL 6 DAILY) 540 tablet 3   traMADol (ULTRAM) 50 MG tablet Take 1-2 tablets (50-100 mg total) by mouth at bedtime as needed. (Patient taking differently: Take 50-100 mg by mouth at bedtime as needed (pain.).) 30 tablet 2   warfarin (COUMADIN) 3 MG tablet Take 1 to 1 and 1/2 tablets by mouth daily or as directed by Coumadin Clinic 45 tablet 1   No current facility-administered medications for this visit.   Vitals:   12/03/22 1333  BP: 114/73  Pulse: 82  SpO2: 100%  Weight: 201 lb (91.2 kg)   Wt Readings from Last 3 Encounters:  12/03/22 201 lb (91.2 kg)  11/08/22 198 lb 12.8 oz (90.2 kg)  10/02/22 189 lb (85.7 kg)   Lab Results  Component Value Date   CREATININE 2.39 (H) 12/03/2022   CREATININE 1.90 (H) 11/13/2022   CREATININE 1.67 (H) 10/02/2022   PHYSICAL EXAM:  General:  Well appearing. No resp difficulty HEENT: normal Neck: supple. JVP elevated 6-8cm. No lymphadenopathy or thryomegaly appreciated. Cor: PMI normal. Regular rate & rhythm. No rubs, gallops or murmurs. Lungs: clear Abdomen: soft, nontender, nondistended. No hepatosplenomegaly. No bruits or masses.  Extremities: no cyanosis, clubbing, rash, 2+ pitting edema bilateral lower legs with L>R Neuro: alert & orientedx3, cranial nerves grossly intact. Moves all 4 extremities w/o difficulty. Affect pleasant.   ECG: not done  ReDs: 45%   ASSESSMENT & PLAN:  1: Chronic heart failure with reduced ejection fraction- - likely combination of iCM/NICM - NYHA class III - moderately fluid overloaded with worsening symptoms, elevated ReDs reading and pitting leg edema - weighing daily - weight up 12 pounds from last visit here 2 months ago - Echo (2/21): EF 30-35%  (was 35-40% in 6/20) - RHC (04/13/19): with elevated filling pressures but BNP normal  - Echo (8/22): EF  30-35%, grade I DD, RV low normal - Echo 11/23 EF 30-35% - Underwent R/L cath in 5/24 for refractory HF. Found to have severe distal LMCA disease extending into the ostial LAD and LCx with elevated left and right heart filling pressures and low cardiac output (PCWP 34, RA 14, Fick CO/CI 4.1/2.1). Discharged home with Hospice services - not using cardiomems - ReDs 45% - send for 80mg  IV lasix/ PO potassium today - BMP/ BNP done earlier today; repeat later this week - resume torsemide tomorrow - continue jardiance 10mg  daily - off metoprolol succinate due to low output - continue palliative care - get compression socks and put them on every morning with removal at bedtime - BNP 10/02/22 was 990.8  2: CAD- - saw cardiology (End) 08/24 - Cath 04/13/19 LAD 70% with +FFR otherwise diffuse non-obstructive CAD  - S/p PCI/DES to LAD 1/21 - Underwent R/L cath in 5/24 for refractory HF. Found to have severe distal LMCA disease extending into the ostial LAD and LCx. Reviewed with IC. No revascularization options  3: HTN- - BP 114/73 - saw PCP [name]) 06/24 - BMP 11/13/22 showed sodium 139, potassium 4.5, creatinine 1.9 & GFR 34  4: DM- - continue jardiance 10mg  daily - A1c 06/05/22 was 7.5%  5: PAF- - Zio patch 4/21 showed no AF. Mostly NSR w/ brief SVT/ NSVT, PACs and PVCs - Remains inNSR - Continue warfarin  Return later this week, sooner if needed.

## 2022-12-03 NOTE — Telephone Encounter (Signed)
Lucas Caldwell will see pt at 3pm today. Pt to have lab work before appt. Stated will come about 1pm for lab work.  Amy RN aware.

## 2022-12-04 ENCOUNTER — Ambulatory Visit: Payer: Self-pay | Admitting: *Deleted

## 2022-12-04 ENCOUNTER — Telehealth: Payer: Self-pay

## 2022-12-04 NOTE — Telephone Encounter (Signed)
Message from Bicknell M sent at 12/04/2022  9:06 AM EDT  Summary: very constipated   Pt wife stated pt is very constipated and has been up all night.  Denied abdominal pain.  Seeking clinical advice.          Call History  Contact Date/Time Type Contact Phone/Fax User  12/04/2022 09:04 AM EDT Phone (Incoming) Michail Sermon (Emergency Contact) [phone] Rexene Edison) McGill, Alondra   Reason for Disposition  MILD constipation    No BM for 2 days. Has not tried any OTC laxatives except for 2 doses of Miralax.  Answer Assessment - Initial Assessment Questions 1. STOOL PATTERN OR FREQUENCY: "How often do you have a bowel movement (BM)?"  (Normal range: 3 times a day to every 3 days)  "When was your last BM?"       I returned her call to wife [place].    [name] very constipated.   Day before yesterday is his last BM.   He goes daily.   No new medications have been started.   He took 2 doses of Miralax "but that stuff don't work".   When I tried to ask her how he was taken it, how often and when she never did answer me.  She was talking fast and saying he is real constipated.  "I don't understand all these questions he just needs something for the constipation.    I asked if he had tried any OTC medication like Dulcolax or anything and she said He had not.   2. STRAINING: "Do you have to strain to have a BM?"      Yes    3. RECTAL PAIN: "Does your rectum hurt when the stool comes out?" If Yes, ask: "Do you have hemorrhoids? How bad is the pain?"  (Scale 1-10; or mild, moderate, severe)     [name] having pain in his abd.    He was in the HF clinic yesterday.  [name] weak.   Too weak to come into the office.  Can he try an enema?    I let her know they could try something like a Fleets enema.   She said she could not do that and would need someone to come do that at the house.    I asked if he had a anyone like a nurse or assistant that comes to the house who could help or assist her with giving an enema.    "I think I can get someone to come help me".   "Let me try getting someone to come over and help me give him an enema first and then I'll call you back if that doesn't make him have a BM".     "[name] too weak to take anywhere".    I let her know to please call us back if the enema did not produce any results and I would let Dr. [name] know.    She was agreeable to this plan. 4. STOOL COMPOSITION: "Are the stools hard?"      Last BM was day before yesterday (Monday) 5. BLOOD ON STOOLS: "Has there been any blood on the toilet tissue or on the surface of the BM?" If Yes, ask: "When was the last time?"     No   6. CHRONIC CONSTIPATION: "Is this a new problem for you?"  If No, ask: "How long have you had this problem?" (days, weeks, months)      Not asked 7. CHANGES IN DIET  OR HYDRATION: "Have there been any recent changes in your diet?" "How much fluids are you drinking on a daily basis?"  "How much have you had to drink today?"     No changes or new medications 8. MEDICINES: "Have you been taking any new medicines?" "Are you taking any narcotic pain medicines?" (e.g., Dilaudid, morphine, Percocet, Vicodin)     No 9. LAXATIVES: "Have you been using any stool softeners, laxatives, or enemas?"  If Yes, ask "What, how often, and when was the last time?"     No except 2 doses of Miralax without results. 10. ACTIVITY:  "How much walking do you do every day?"  "Has your activity level decreased in the past week?"        Very little     "He has heart failure and is very sick".    He don't get around very much because he is so weak".   11. CAUSE: "What do you think is causing the constipation?"        Not asked 12. OTHER SYMPTOMS: "Do you have any other symptoms?" (e.g., abdomen pain, bloating, fever, vomiting)       When asked if he was having abd pain she said yes but then stated a minute or so later that he is not having abd pain or bloating. 13. MEDICAL HISTORY: "Do you have a history of  hemorrhoids, rectal fissures, or rectal surgery or rectal abscess?"         Not asked     He was at the heart clinic yesterday.   He has heart failure real bad. 14. PREGNANCY: "Is there any chance you are pregnant?" "When was your last menstrual period?"       N/A  Protocols used: Constipation-A-AH

## 2022-12-04 NOTE — Telephone Encounter (Signed)
  Chief Complaint: No BM for 2 days.  Has had 2 doses of Miralax but unable to determine when and how often from [place], his wife who I was talking with. Symptoms: "[name] constipated".   "Last BM was day before yesterday"   He has not tried anything OTC other than the Miralax. Frequency: No BM since day before yesterday. Pertinent Negatives: Patient denies abd pain or bloating, not started any new medications.   Has bad heart failure.   Was at the HF clinic yesterday per [place], wife. Disposition: [] ED /[] Urgent Care (no appt availability in office) / [] Appointment(In office/virtual)/ []  [place] [facility]/ [x] Home Care/ [] Refused Recommended Disposition /[] [place] [facility]/ []  Follow-up with PCP Additional Notes: I went over the home care advice since they had not tried any OTC medications except the Miralax which he only had 2 doses. [name] wants to try an enema and call us back if there are no results from it.   She's going to have someone come over and assist her with giving it. I encouraged her to call back if the enema did not produce any stool.    She was agreeable to this plan.

## 2022-12-04 NOTE — Telephone Encounter (Addendum)
Called received from [employer] RN (hospice) Pt is not doing "great" BP 70/56 checked by RN.  Pt in bed not feeling well. [name] refused to take pt to the ED.  Per  Amy RN will let Clotilde Dieter know to hold torsemide at this time. And will encourage to take pt to the hospital.  Amy will call later with an update.   This RN called [name] to gather more information and to speak with pt. Pt spouse stated she will call me right back. This RN spoke with Clarisa Kindred, FNP. Agreed with advising pt to go to the ED. Will call pt spouse to advice again.   Pt spouse called to let us know what pt current BP 121/87 HR 94. Denies CP/SOB/dizziness. Pt spouse Clotilde Dieter stated that pt was awake and talking. Currently on 4L   unknown oxygen saturation [name] stated that pt was constipated yesterday and had a bowel movement today.  Pt will come to his appointment on Friday to see Dr. Gala Romney. Advice pt spouse to call if has any questions and to call EMS if pt becomes dizzy, chest pain or SOB. Pt and spouse aware, agreeable, and verbalized understanding  Will route to Clarisa Kindred, FNP for FYI.

## 2022-12-06 ENCOUNTER — Encounter: Payer: Medicare HMO | Admitting: Internal Medicine

## 2022-12-11 ENCOUNTER — Ambulatory Visit: Payer: Medicare HMO

## 2022-12-17 DEATH — deceased

## 2022-12-26 ENCOUNTER — Encounter: Payer: Medicare HMO | Admitting: Internal Medicine

## 2023-01-06 ENCOUNTER — Other Ambulatory Visit: Payer: Self-pay | Admitting: Family Medicine

## 2023-01-27 ENCOUNTER — Ambulatory Visit: Payer: Medicare HMO | Admitting: Internal Medicine
